# Patient Record
Sex: Female | Born: 1945 | Race: White | Hispanic: No | Marital: Married | Smoking: Never smoker
Health system: Southern US, Community
[De-identification: ages and names within clinical notes are randomized; demographics above are authoritative.]

## PROBLEM LIST (undated history)

## (undated) DIAGNOSIS — I2510 Atherosclerotic heart disease of native coronary artery without angina pectoris: Secondary | ICD-10-CM

## (undated) DIAGNOSIS — E278 Other specified disorders of adrenal gland: Secondary | ICD-10-CM

## (undated) DIAGNOSIS — I5189 Other ill-defined heart diseases: Secondary | ICD-10-CM

## (undated) DIAGNOSIS — R0600 Dyspnea, unspecified: Secondary | ICD-10-CM

## (undated) DIAGNOSIS — N183 Chronic kidney disease, stage 3 unspecified: Secondary | ICD-10-CM

## (undated) DIAGNOSIS — K589 Irritable bowel syndrome without diarrhea: Secondary | ICD-10-CM

## (undated) DIAGNOSIS — K5790 Diverticulosis of intestine, part unspecified, without perforation or abscess without bleeding: Secondary | ICD-10-CM

## (undated) DIAGNOSIS — I5030 Unspecified diastolic (congestive) heart failure: Secondary | ICD-10-CM

## (undated) DIAGNOSIS — R51 Headache: Principal | ICD-10-CM

## (undated) DIAGNOSIS — D649 Anemia, unspecified: Secondary | ICD-10-CM

## (undated) DIAGNOSIS — G2581 Restless legs syndrome: Secondary | ICD-10-CM

## (undated) DIAGNOSIS — F32A Depression, unspecified: Secondary | ICD-10-CM

## (undated) DIAGNOSIS — I447 Left bundle-branch block, unspecified: Secondary | ICD-10-CM

## (undated) DIAGNOSIS — Z8489 Family history of other specified conditions: Secondary | ICD-10-CM

## (undated) DIAGNOSIS — K219 Gastro-esophageal reflux disease without esophagitis: Secondary | ICD-10-CM

## (undated) DIAGNOSIS — M12811 Other specific arthropathies, not elsewhere classified, right shoulder: Secondary | ICD-10-CM

## (undated) DIAGNOSIS — R269 Unspecified abnormalities of gait and mobility: Secondary | ICD-10-CM

## (undated) DIAGNOSIS — R42 Dizziness and giddiness: Secondary | ICD-10-CM

## (undated) DIAGNOSIS — E119 Type 2 diabetes mellitus without complications: Secondary | ICD-10-CM

## (undated) DIAGNOSIS — E785 Hyperlipidemia, unspecified: Secondary | ICD-10-CM

## (undated) DIAGNOSIS — M1990 Unspecified osteoarthritis, unspecified site: Secondary | ICD-10-CM

## (undated) DIAGNOSIS — I471 Supraventricular tachycardia, unspecified: Secondary | ICD-10-CM

## (undated) DIAGNOSIS — F419 Anxiety disorder, unspecified: Secondary | ICD-10-CM

## (undated) DIAGNOSIS — D509 Iron deficiency anemia, unspecified: Secondary | ICD-10-CM

## (undated) DIAGNOSIS — I209 Angina pectoris, unspecified: Secondary | ICD-10-CM

## (undated) DIAGNOSIS — G4733 Obstructive sleep apnea (adult) (pediatric): Secondary | ICD-10-CM

## (undated) DIAGNOSIS — Z9884 Bariatric surgery status: Secondary | ICD-10-CM

## (undated) DIAGNOSIS — Z9989 Dependence on other enabling machines and devices: Secondary | ICD-10-CM

## (undated) DIAGNOSIS — J45909 Unspecified asthma, uncomplicated: Secondary | ICD-10-CM

## (undated) DIAGNOSIS — E1142 Type 2 diabetes mellitus with diabetic polyneuropathy: Secondary | ICD-10-CM

## (undated) DIAGNOSIS — E669 Obesity, unspecified: Secondary | ICD-10-CM

## (undated) DIAGNOSIS — I700 Atherosclerosis of aorta: Secondary | ICD-10-CM

## (undated) DIAGNOSIS — F329 Major depressive disorder, single episode, unspecified: Secondary | ICD-10-CM

## (undated) DIAGNOSIS — K222 Esophageal obstruction: Secondary | ICD-10-CM

## (undated) DIAGNOSIS — M109 Gout, unspecified: Secondary | ICD-10-CM

## (undated) DIAGNOSIS — R0789 Other chest pain: Secondary | ICD-10-CM

## (undated) DIAGNOSIS — M4802 Spinal stenosis, cervical region: Secondary | ICD-10-CM

## (undated) DIAGNOSIS — I10 Essential (primary) hypertension: Secondary | ICD-10-CM

## (undated) DIAGNOSIS — R55 Syncope and collapse: Secondary | ICD-10-CM

## (undated) HISTORY — PX: COLONOSCOPY: SHX5424

## (undated) HISTORY — DX: Esophageal obstruction: K22.2

## (undated) HISTORY — DX: Depression, unspecified: F32.A

## (undated) HISTORY — DX: Restless legs syndrome: G25.81

## (undated) HISTORY — DX: Dizziness and giddiness: R42

## (undated) HISTORY — DX: Syncope and collapse: R55

## (undated) HISTORY — DX: Left bundle-branch block, unspecified: I44.7

## (undated) HISTORY — DX: Supraventricular tachycardia, unspecified: I47.10

## (undated) HISTORY — DX: Unspecified abnormalities of gait and mobility: R26.9

## (undated) HISTORY — DX: Other specified disorders of adrenal gland: E27.8

## (undated) HISTORY — DX: Irritable bowel syndrome, unspecified: K58.9

## (undated) HISTORY — PX: ESOPHAGEAL DILATION: SHX303

## (undated) HISTORY — DX: Atherosclerotic heart disease of native coronary artery without angina pectoris: I25.10

## (undated) HISTORY — DX: Spinal stenosis, cervical region: M48.02

## (undated) HISTORY — PX: GALLBLADDER SURGERY: SHX652

## (undated) HISTORY — PX: EYE SURGERY: SHX253

## (undated) HISTORY — DX: Supraventricular tachycardia: I47.1

## (undated) HISTORY — DX: Unspecified diastolic (congestive) heart failure: I50.30

## (undated) HISTORY — PX: BACK SURGERY: SHX140

## (undated) HISTORY — DX: Obstructive sleep apnea (adult) (pediatric): G47.33

## (undated) HISTORY — DX: Anemia, unspecified: D64.9

## (undated) HISTORY — PX: APPENDECTOMY: SHX54

## (undated) HISTORY — PX: CARDIAC CATHETERIZATION: SHX172

## (undated) HISTORY — DX: Headache: R51

## (undated) HISTORY — DX: Type 2 diabetes mellitus without complications: E11.9

## (undated) HISTORY — PX: CORONARY ANGIOPLASTY: SHX604

## (undated) HISTORY — DX: Unspecified osteoarthritis, unspecified site: M19.90

## (undated) HISTORY — DX: Essential (primary) hypertension: I10

## (undated) HISTORY — PX: ESOPHAGOGASTRODUODENOSCOPY: SHX1529

## (undated) HISTORY — DX: Obesity, unspecified: E66.9

## (undated) HISTORY — DX: Gout, unspecified: M10.9

## (undated) HISTORY — PX: GASTRIC BYPASS: SHX52

## (undated) HISTORY — PX: CARDIOVASCULAR STRESS TEST: SHX262

## (undated) HISTORY — DX: Other chest pain: R07.89

## (undated) HISTORY — PX: ABDOMINAL HYSTERECTOMY: SHX81

## (undated) HISTORY — DX: Major depressive disorder, single episode, unspecified: F32.9

## (undated) HISTORY — PX: ROTATOR CUFF REPAIR: SHX139

## (undated) HISTORY — DX: Type 2 diabetes mellitus with diabetic polyneuropathy: E11.42

---

## 1972-07-16 HISTORY — PX: TOTAL ABDOMINAL HYSTERECTOMY W/ BILATERAL SALPINGOOPHORECTOMY: SHX83

## 1974-07-16 HISTORY — PX: GALLBLADDER SURGERY: SHX652

## 1974-07-16 HISTORY — PX: CHOLECYSTECTOMY: SHX55

## 1986-04-15 HISTORY — PX: LEFT HEART CATH AND CORONARY ANGIOGRAPHY: CATH118249

## 1992-07-16 DIAGNOSIS — M4802 Spinal stenosis, cervical region: Secondary | ICD-10-CM

## 1992-07-16 HISTORY — DX: Spinal stenosis, cervical region: M48.02

## 1993-07-16 HISTORY — PX: SPINE SURGERY: SHX786

## 2003-08-26 ENCOUNTER — Other Ambulatory Visit: Payer: Self-pay

## 2004-03-12 ENCOUNTER — Other Ambulatory Visit: Payer: Self-pay

## 2004-05-03 ENCOUNTER — Ambulatory Visit: Payer: Self-pay | Admitting: Unknown Physician Specialty

## 2004-05-26 ENCOUNTER — Ambulatory Visit: Payer: Self-pay | Admitting: Unknown Physician Specialty

## 2004-06-28 ENCOUNTER — Encounter: Payer: Self-pay | Admitting: Family Medicine

## 2004-07-16 ENCOUNTER — Encounter: Payer: Self-pay | Admitting: Family Medicine

## 2004-08-16 ENCOUNTER — Encounter: Payer: Self-pay | Admitting: Family Medicine

## 2004-09-13 ENCOUNTER — Emergency Department: Payer: Self-pay | Admitting: Emergency Medicine

## 2005-01-10 ENCOUNTER — Ambulatory Visit: Payer: Self-pay | Admitting: Unknown Physician Specialty

## 2005-03-16 ENCOUNTER — Ambulatory Visit: Payer: Self-pay | Admitting: Podiatry

## 2005-03-21 ENCOUNTER — Ambulatory Visit: Payer: Self-pay | Admitting: Unknown Physician Specialty

## 2005-05-31 ENCOUNTER — Emergency Department: Payer: Self-pay | Admitting: Emergency Medicine

## 2005-07-16 HISTORY — PX: TOTAL KNEE ARTHROPLASTY: SHX125

## 2005-07-16 HISTORY — PX: JOINT REPLACEMENT: SHX530

## 2005-09-04 ENCOUNTER — Other Ambulatory Visit: Payer: Self-pay

## 2005-09-04 ENCOUNTER — Emergency Department: Payer: Self-pay | Admitting: General Practice

## 2005-10-02 ENCOUNTER — Inpatient Hospital Stay: Payer: Self-pay | Admitting: Unknown Physician Specialty

## 2005-10-05 ENCOUNTER — Ambulatory Visit: Payer: Self-pay | Admitting: Physical Medicine & Rehabilitation

## 2005-10-05 ENCOUNTER — Inpatient Hospital Stay (HOSPITAL_COMMUNITY)
Admission: RE | Admit: 2005-10-05 | Discharge: 2005-10-13 | Payer: Self-pay | Admitting: Physical Medicine & Rehabilitation

## 2006-03-02 ENCOUNTER — Emergency Department: Payer: Self-pay | Admitting: General Practice

## 2006-07-10 ENCOUNTER — Observation Stay: Payer: Self-pay | Admitting: Gastroenterology

## 2006-12-13 ENCOUNTER — Other Ambulatory Visit: Payer: Self-pay

## 2006-12-13 ENCOUNTER — Emergency Department: Payer: Self-pay | Admitting: Emergency Medicine

## 2006-12-20 ENCOUNTER — Encounter: Admission: RE | Admit: 2006-12-20 | Discharge: 2006-12-20 | Payer: Self-pay | Admitting: Neurology

## 2006-12-24 ENCOUNTER — Ambulatory Visit: Payer: Self-pay | Admitting: Internal Medicine

## 2007-09-02 ENCOUNTER — Ambulatory Visit: Payer: Self-pay | Admitting: Gastroenterology

## 2007-10-07 ENCOUNTER — Ambulatory Visit: Payer: Self-pay | Admitting: Gastroenterology

## 2007-12-15 ENCOUNTER — Ambulatory Visit: Payer: Self-pay | Admitting: Oncology

## 2007-12-18 ENCOUNTER — Ambulatory Visit: Payer: Self-pay | Admitting: Oncology

## 2007-12-31 ENCOUNTER — Other Ambulatory Visit: Payer: Self-pay

## 2007-12-31 ENCOUNTER — Inpatient Hospital Stay: Payer: Self-pay | Admitting: Internal Medicine

## 2008-01-14 ENCOUNTER — Ambulatory Visit: Payer: Self-pay | Admitting: Oncology

## 2008-02-06 ENCOUNTER — Ambulatory Visit: Payer: Self-pay | Admitting: Urology

## 2008-02-14 ENCOUNTER — Ambulatory Visit: Payer: Self-pay | Admitting: Oncology

## 2008-03-02 LAB — HM COLONOSCOPY: HM Colonoscopy: NORMAL

## 2008-03-16 ENCOUNTER — Ambulatory Visit: Payer: Self-pay | Admitting: Oncology

## 2008-04-15 ENCOUNTER — Ambulatory Visit: Payer: Self-pay | Admitting: Oncology

## 2008-04-19 ENCOUNTER — Ambulatory Visit: Payer: Self-pay | Admitting: Oncology

## 2008-05-07 ENCOUNTER — Ambulatory Visit: Payer: Self-pay | Admitting: Specialist

## 2008-05-16 ENCOUNTER — Ambulatory Visit: Payer: Self-pay | Admitting: Oncology

## 2008-07-16 ENCOUNTER — Ambulatory Visit: Payer: Self-pay | Admitting: Oncology

## 2008-07-19 ENCOUNTER — Ambulatory Visit: Payer: Self-pay | Admitting: Internal Medicine

## 2008-07-27 ENCOUNTER — Ambulatory Visit: Payer: Self-pay | Admitting: Oncology

## 2008-08-13 ENCOUNTER — Ambulatory Visit: Payer: Self-pay | Admitting: Specialist

## 2008-08-16 ENCOUNTER — Ambulatory Visit: Payer: Self-pay | Admitting: Oncology

## 2008-08-31 ENCOUNTER — Ambulatory Visit: Payer: Self-pay | Admitting: Internal Medicine

## 2008-10-25 ENCOUNTER — Ambulatory Visit: Payer: Self-pay | Admitting: Urology

## 2008-11-01 ENCOUNTER — Encounter: Admission: RE | Admit: 2008-11-01 | Discharge: 2009-01-30 | Payer: Self-pay | Admitting: Neurology

## 2009-01-13 ENCOUNTER — Ambulatory Visit: Payer: Self-pay | Admitting: Oncology

## 2009-01-26 ENCOUNTER — Ambulatory Visit: Payer: Self-pay | Admitting: Oncology

## 2009-02-13 ENCOUNTER — Ambulatory Visit: Payer: Self-pay | Admitting: Oncology

## 2009-03-18 ENCOUNTER — Emergency Department: Payer: Self-pay | Admitting: Emergency Medicine

## 2009-07-05 ENCOUNTER — Ambulatory Visit: Payer: Self-pay | Admitting: Internal Medicine

## 2009-08-12 ENCOUNTER — Emergency Department: Payer: Self-pay | Admitting: Emergency Medicine

## 2009-08-22 ENCOUNTER — Ambulatory Visit: Payer: Self-pay | Admitting: Gastroenterology

## 2009-09-13 DIAGNOSIS — K222 Esophageal obstruction: Secondary | ICD-10-CM

## 2009-09-13 HISTORY — DX: Esophageal obstruction: K22.2

## 2009-09-18 ENCOUNTER — Ambulatory Visit: Payer: Self-pay | Admitting: Gastroenterology

## 2009-11-22 ENCOUNTER — Ambulatory Visit: Payer: Self-pay | Admitting: Internal Medicine

## 2010-02-23 ENCOUNTER — Ambulatory Visit: Payer: Self-pay | Admitting: Internal Medicine

## 2010-04-20 ENCOUNTER — Encounter: Payer: Self-pay | Admitting: Neurology

## 2010-05-08 ENCOUNTER — Ambulatory Visit: Payer: Self-pay | Admitting: Internal Medicine

## 2010-05-16 ENCOUNTER — Encounter: Payer: Self-pay | Admitting: Neurology

## 2010-06-15 ENCOUNTER — Encounter: Payer: Self-pay | Admitting: Neurology

## 2010-09-22 ENCOUNTER — Emergency Department: Payer: Self-pay | Admitting: Emergency Medicine

## 2010-10-31 ENCOUNTER — Ambulatory Visit: Payer: Self-pay | Admitting: Internal Medicine

## 2010-12-01 NOTE — H&P (Signed)
Denise Sharp, Denise Sharp             ACCOUNT NO.:  [id number]   MEDICAL RECORD NO.:  [id number]          PATIENT TYPE:  IPS   LOCATION:  4025                         FACILITY:  [facility]   PHYSICIAN:  Charlett Blake, M.D.DATE OF BIRTH:  24-Feb-1946   DATE OF ADMISSION:  10/05/2005  DATE OF DISCHARGE:                                HISTORY & PHYSICAL   HISTORY:  A 65 year old female, with a history of diabetes and neuropathy as  well as bilateral knee pain with tricompartmental osteoarthritis  unresponsive to conservative care, elected to undergo bilateral total knee  replacements on October 02, 2005, per Dr. Mauri Pole.  Postoperatively, she was  made weightbearing as tolerated and started physical therapy.  She was  started on subcu Lovenox for DVT prophylaxis.  She had postoperative anemia.   REVIEW OF SYSTEMS:  Positive for urinary incontinence, urge incontinence.  She has bowel incontinence associated with her irritable bowel syndrome and  chronic low back pain as well as right lower extremity radicular symptoms.   PAST MEDICAL HISTORY:  1.  Pernicious anemia.  2.  Asthma.  3.  Cervical stenosis.  4.  Depression.  5.  Gout.  6.  Irritable bowel syndrome.  7.  Osteoporosis.  8.  Renal calculi.  9.  Reactive airway disease.  10. Sleep apnea requiring CPAP.   PAST SURGICAL HISTORY:  1.  Cholecystectomy.  2.  Appendectomy.  3.  Bilateral oophorectomy.  4.  Gastric bypass in 2001.   FAMILY HISTORY:  Positive for CAD, positive for RA, positive for  hypertension.   SOCIAL HISTORY:  Disabled, retired [employer] scientist, married, lives in a one level home  with ramp entry.   FUNCTIONAL HISTORY:  Used a scooter as well as a walker prior to admission.   FUNCTIONAL STATUS:  Requires physical assistance for ADLs and mobility.   HOME MEDICATIONS:  Climara, Flexeril, Vicodin, Imodium, and Zyrtec as well  as Lunesta.   ALLERGIES:  1.  LASIX.  2.  SULFA.  3.  TETRACYCLINE.   ADMISSION  MEDICATIONS:  1.  Lovenox 30 subcu q.12h.  2.  Trinsicon one p.o. b.i.d.  3.  Zyrtec 10 mg p.o. every day.  4.  Lunesta 1 mg p.o. q.h.s.  5.  Flexeril 5 to 10 p.o. t.i.d.  6.  Imodium 2 mg p.o. p.r.n., max 8 mg per day.  7.  Climara patch 0.1 mg change every week.   Last chest x-ray, done October 02, 2005, showed well expanded lung fields  clear.   PHYSICAL EXAMINATION:  GENERAL:  Obese female in no acute distress.  EYES:  Anicteric.  No injected.  ENT:  External ENT normal.  NECK:  Supple without adenopathy.  RESPIRATORY:  Effort is good.  LUNGS:  Clear.  HEART:  Regular rate and rhythm.  EXTREMITIES:  No clubbing, cyanosis, edema.  Extremities show bilateral knee  incisions clean and dry.  Staples intact.  No drainage.  ABDOMEN:  Positive bowel sounds.  Soft, nontender to palpation.  NEUROLOGIC:  Mood, memory, and affect are all normal.  Sensation is reduced  in the feet compared to the knees.  Motor strength is 5/5 bilateral upper  extremities and lower extremities.  In the lower extremities, she is 3- in  the hip flexors, knee extensors, and 4- ankle dorsiflexors.   IMPRESSION:  1.  Decline in self care mobility skills, status post bilateral total knee      replacements for osteoarthritis.  2.  History of diabetic neuropathy with lower extremity sensory loss.  3.  Irritable bowel syndrome necessitating frequent trips to the bathroom as      well as adult diaper usage.  4.  Chronic insomnia.  5.  Deep vein thrombosis prophylaxis with subcutaneous Lovenox.  6.  Postoperative knee pain as needed Vicodin and Flexeril.    Estimated length of stay is 10 to 14 days.  The patient is a good rehab  candidate to achieve goals of supervision level, ADLs, and mobility.      Charlett Blake, M.D.  Electronically Signed     AEK/MEDQ  D:  10/05/2005  T:  10/06/2005  Job:  [id number]   cc:   A. Sabra Heck, Dr.   Mauri Pole, Dr.   Tiffany Kocher, Dr.

## 2010-12-01 NOTE — Discharge Summary (Signed)
NAMEJENELLY, Denise Sharp             ACCOUNT NO.:  [id number]   MEDICAL RECORD NO.:  [id number]          PATIENT TYPE:  IPS   LOCATION:  [id number]                         FACILITY:  [facility]   PHYSICIAN:  Charlett Blake, M.D.DATE OF BIRTH:  [birth year]   DATE OF ADMISSION:  10/05/2005  DATE OF DISCHARGE:  10/13/2005                                 DISCHARGE SUMMARY   DISCHARGE DIAGNOSES:  1.  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  2.  Irritable bowel syndrome.  3.  Morbid obesity.  4.  Diabetes mellitus type 2.  5.  Diabetic neuropathy.  6.  Pernicious anemia.   HISTORY OF PRESENT ILLNESS:  Denise Sharp is a 65 year old female with  history of diabetes, diet controlled; bilateral knee pain secondary to  tricompartmental OA.  She elected to undergo bilateral total knee  replacement October 02, 2005, by Dr. Mauri Pole.  Postoperative is weightbearing  as tolerated and on subcu Lovenox for DVT prophylaxis.  Also noted to have  acute blood loss anemia.  Therapies initiated and the patient noted to have  impairments in mobility and self-care.  Rehab consulted for further  therapies.   PAST MEDICAL HISTORY:  Significant for asthma, cervical stenosis,  depression, gout, IBS, cholecystectomy, appendectomy, bilateral  oophorectomies, osteopenia, gastric bypass x2, renal calculi, reactive  airway disease, sleep apnea, DJD in 2004, bilateral knee surgeries and right  shoulder DJD, low-back pain with radiculopathy right lower extremity.   ALLERGIES:  LASIX, SULFA.   [facility] is positive for coronary artery disease, RV, and hypertension.   SOCIAL HISTORY:  The patient is married, retired Marine scientist, disabled.  Lives in  one-level home with ramp at entry.  Used a scooter; otherwise, ambulated  short distances and was using a walker for support.  Does not use any  tobacco or alcohol.   HOSPITAL COURSE:  Denise Sharp was admitted to rehab on October 05, 2005,  for inpatient therapies to consist of PT/OT daily.  Past admission she  was maintained on subcu Lovenox for DVT prophylaxis.  Pain control was  reasonable with the use of p.r.n. oxycodone.  Labs done past admission  showed the patient to continue with acute blood loss anemia with H&H at 8.9  and 27.3.  White count was noted to be at 11.4.  The patient was noted to  have some low-grade temperature elevation past admission.  Incentive  spirometry was recommended.  A UA UCS was checked and showed 10,000 colonies  of multiple species.  The patient's fevers resolved as mobility improved.  Check of electrolytes revealed sodium 138, potassium 3.5, chloride 103, CO2  30, BUN 9, creatinine 0.7, glucose 172.  Secondary to impaired fasting blood  sugars, CBGs were checked on a.c./h.s. basis initially.  Hemoglobin A1c was  also checked and was noted to be high at 6.7.  The patient's blood sugars  did rise up to 200s.  Therefore, Glucophage 500 mg was initiated and  increased to 500 mg t.i.d.  Blood sugars at time of discharge were somewhat  improved, ranging to 110s to highs  in [place].  The patient has been instructed  on carbohydrate-modified diet and is to continue with Glucophage for now.  As mobility improves, watch for blood sugars to trend downwards.  The  patient to follow up with Dr. Kem Kays for recheck hemoglobin A1c in  the next 4 weeks and further adjustment of her hyperglycemic as needed.   The patient's respiratory status has been stable.  She has been using CPAP  on p.r.n. basis during her stay.  Does report improvement in sleep hygiene  with use of CPAP and was encouraged to continue using this daily.  The  patient's knee incisions were noted to be healing well without any signs or  symptoms of infection; no drainage, no erythema noted.  Staples discontinued  and areas with Steri-Strips on postoperative day #11 without difficulty.  During her stay in rehab, Denise Sharp progressed  along well.  At time of  discharge the patient was at supervision to min assist for car transfers,  supervision for ambulating 100 feet, supervision for navigating ramp.  She  was able to ambulate greater than 200 feet x4 with a rolling walker with  supervision.  The patient was at set up assist for ADLs.  Knee flexion at  time of discharge on right was at -4 to 84 degrees, left 5 to 76 degrees.  CPM was used for range of motion throughout the stay.  The patient will  continue with further followup.  Home health PT/OT by [place].  Home health RN is arranged for education and for Lovenox draws.  The patient  to continue on Lovenox for three additional days to complete her DVT  prophylaxis course.   DISCHARGE MEDICATIONS:  1.  Climara patch changed weekly.  2.  Zyrtec 10 mg a day.  3.  Trinsicon one p.o. b.i.d.  4.  Glucophage 500 mg t.i.d. a.c.  5.  Vicodin one to two q.4-6h. p.r.n. pain.  6.  Flexeril 10 mg one-half to one q.8h. p.r.n. spasms.  7.  Lunesta half pill q.h.s.  8.  Lovenox 40 mg subcu once daily for three more days.   ACTIVITY:  As tolerated, use a walker and supervision.  No driving for 3-4  weeks.  No strenuous activity 3-4 weeks.   DIET:  Diabetic diet.   WOUND CARE:  Wash with antibacterial soap and water.  Keep clean and dry.   SPECIAL INSTRUCTIONS:  Home health PT/OT/RN by [facility].   FOLLOWUP:  The patient to follow up with Dr. Mauri Pole for postoperative check.  Follow up with Dr. Kem Kays in 2-4 weeks for diabetic monitoring.  Follow up with Dr. Letta Pate as needed.      Thornton Dales, P.A.      Charlett Blake, M.D.  Electronically Signed    PP/MEDQ  D:  10/16/2005  T:  10/17/2005  Job:  [id number]   cc:   Kem Kays, M.D.   Dr. Mauri Pole

## 2011-03-12 ENCOUNTER — Encounter: Payer: Self-pay | Admitting: Internal Medicine

## 2011-03-12 ENCOUNTER — Ambulatory Visit (INDEPENDENT_AMBULATORY_CARE_PROVIDER_SITE_OTHER): Payer: Medicare Other | Admitting: Internal Medicine

## 2011-03-12 VITALS — BP 122/86 | HR 74 | Temp 98.7°F | Resp 14 | Ht 64.5 in | Wt 230.8 lb

## 2011-03-12 DIAGNOSIS — E1165 Type 2 diabetes mellitus with hyperglycemia: Secondary | ICD-10-CM | POA: Insufficient documentation

## 2011-03-12 DIAGNOSIS — E279 Disorder of adrenal gland, unspecified: Secondary | ICD-10-CM

## 2011-03-12 DIAGNOSIS — IMO0002 Reserved for concepts with insufficient information to code with codable children: Secondary | ICD-10-CM

## 2011-03-12 DIAGNOSIS — E119 Type 2 diabetes mellitus without complications: Secondary | ICD-10-CM | POA: Insufficient documentation

## 2011-03-12 DIAGNOSIS — Z1239 Encounter for other screening for malignant neoplasm of breast: Secondary | ICD-10-CM

## 2011-03-12 DIAGNOSIS — E785 Hyperlipidemia, unspecified: Secondary | ICD-10-CM

## 2011-03-12 DIAGNOSIS — E1140 Type 2 diabetes mellitus with diabetic neuropathy, unspecified: Secondary | ICD-10-CM | POA: Insufficient documentation

## 2011-03-12 DIAGNOSIS — K589 Irritable bowel syndrome without diarrhea: Secondary | ICD-10-CM

## 2011-03-12 DIAGNOSIS — H9191 Unspecified hearing loss, right ear: Secondary | ICD-10-CM | POA: Insufficient documentation

## 2011-03-12 DIAGNOSIS — I10 Essential (primary) hypertension: Secondary | ICD-10-CM | POA: Insufficient documentation

## 2011-03-12 DIAGNOSIS — R5381 Other malaise: Secondary | ICD-10-CM

## 2011-03-12 DIAGNOSIS — G4710 Hypersomnia, unspecified: Secondary | ICD-10-CM

## 2011-03-12 DIAGNOSIS — E278 Other specified disorders of adrenal gland: Secondary | ICD-10-CM

## 2011-03-12 DIAGNOSIS — H9190 Unspecified hearing loss, unspecified ear: Secondary | ICD-10-CM

## 2011-03-12 DIAGNOSIS — G2581 Restless legs syndrome: Secondary | ICD-10-CM

## 2011-03-12 DIAGNOSIS — E1142 Type 2 diabetes mellitus with diabetic polyneuropathy: Secondary | ICD-10-CM | POA: Insufficient documentation

## 2011-03-12 DIAGNOSIS — E1149 Type 2 diabetes mellitus with other diabetic neurological complication: Secondary | ICD-10-CM | POA: Insufficient documentation

## 2011-03-12 DIAGNOSIS — E1349 Other specified diabetes mellitus with other diabetic neurological complication: Secondary | ICD-10-CM

## 2011-03-12 DIAGNOSIS — E08610 Diabetes mellitus due to underlying condition with diabetic neuropathic arthropathy: Secondary | ICD-10-CM

## 2011-03-12 DIAGNOSIS — R5383 Other fatigue: Secondary | ICD-10-CM

## 2011-03-12 MED ORDER — DIAZEPAM 2 MG PO TABS: ORAL_TABLET | ORAL | Status: DC

## 2011-03-12 NOTE — Progress Notes (Signed)
  Subjective:    Patient ID: Denise Sharp, female    DOB: 02-04-1946, 65 y.o.   MRN: [id number]  HPI  Presents with hypersomnolence, occurring for the past 3 months,  She has been falling asleep while crochetin , reading  menus at a restaurant, etc.  She has OSA but has been wearing her CPAP.  Last CPAP study was over one year ago and per patient there were no changes to settings.   And sleeping better at night due to management of bladder incontinence by Dr. Jacqlyn Larsen.  She has been taking 5 mg valium daily by podiatry for nocturnal leg cramps, which is helping, but feels groggy in the morning. Also taking ropinrole and imipramine nightly, and prn oxycodone for leg pain    Review of Systems  Constitutional: Positive for fatigue. Negative for fever, chills, appetite change and unexpected weight change.  HENT: Positive for ear pain. Negative for hearing loss, nosebleeds, congestion, sore throat, facial swelling, rhinorrhea, sneezing, mouth sores, trouble swallowing, neck pain, neck stiffness, voice change, postnasal drip, sinus pressure, tinnitus and ear discharge.   Eyes: Negative for pain, discharge, redness and visual disturbance.  Respiratory: Positive for apnea. Negative for cough, chest tightness, shortness of breath, wheezing and stridor.   Cardiovascular: Negative for chest pain, palpitations and leg swelling.  Gastrointestinal: Negative for nausea, vomiting, abdominal pain, diarrhea, constipation, blood in stool, abdominal distention and anal bleeding.  Genitourinary: Negative for dysuria and flank pain.  Musculoskeletal: Negative for myalgias, arthralgias and gait problem.  Skin: Negative for color change and rash.  Neurological: Positive for weakness and light-headedness. Negative for dizziness and headaches.  Hematological: Negative for adenopathy. Does not bruise/bleed easily.  Psychiatric/Behavioral: Positive for dysphoric mood. Negative for suicidal ideas and sleep disturbance. The  patient is not nervous/anxious.        Objective:   Physical Exam  Constitutional: Vital signs are normal. She appears well-developed. She is active.  HENT:  Head: Normocephalic.  Eyes: EOM are normal. Pupils are equal, round, and reactive to light.  Neck: Normal range of motion. No thyromegaly present.  Abdominal: Soft. Bowel sounds are normal.  Neurological: She is alert.  Skin: Skin is warm and dry.  Psychiatric: She has a normal mood and affect.          Assessment & Plan:  Hypersomnolence: most obvious cause is pharmacotherapy.  Will reduce her valium by 1 mg weekly .  If no improvement with reduction in valiu, weill repeat sleep study.  Fatigue:  Given history of anemia, will check cbc and tsh.  Exercise recommended.  Diabetes Melllitus:  Recently she had a loss of control due to steroids given by ENT for complicated otitis.  Sugars are now under control .  hga1c due at next visit.

## 2011-03-12 NOTE — Patient Instructions (Addendum)
Stop the imipramine at bedtime.  Reduce the valium to 4 mg at bedtime.  If this change does not improve your grogginess, reduce the valium to 3 mg at bedtime.   We will repeat cholesterol and HgbA1c at your next visit.

## 2011-03-13 ENCOUNTER — Encounter: Payer: Self-pay | Admitting: Internal Medicine

## 2011-03-13 DIAGNOSIS — K589 Irritable bowel syndrome without diarrhea: Secondary | ICD-10-CM | POA: Insufficient documentation

## 2011-03-13 DIAGNOSIS — IMO0002 Reserved for concepts with insufficient information to code with codable children: Secondary | ICD-10-CM | POA: Insufficient documentation

## 2011-03-13 DIAGNOSIS — G2581 Restless legs syndrome: Secondary | ICD-10-CM | POA: Insufficient documentation

## 2011-03-15 ENCOUNTER — Telehealth: Payer: Self-pay | Admitting: Internal Medicine

## 2011-03-15 NOTE — Telephone Encounter (Signed)
Patient called and stated she has a bad cough and a sore throat and she forgot to tell you at her office visit this week.  She stated she was getting a treatment today in another doctors office and that doctor would not write her an Rx for her cough.  She wanted to know if you would give her an Rx for cough syrup.  Please advise.

## 2011-03-15 NOTE — Telephone Encounter (Signed)
She did not cough during the office visit one time.  Rx tessalon 200 mg one capsule every 8 hours prn cough  #60,  Gargle with salt water for sore throat

## 2011-03-29 MED ORDER — BENZONATATE 200 MG PO CAPS: 200.0000 mg | ORAL_CAPSULE | Freq: Three times a day (TID) | ORAL | Status: AC | PRN

## 2011-04-23 ENCOUNTER — Encounter: Payer: Self-pay | Admitting: Internal Medicine

## 2011-05-24 ENCOUNTER — Other Ambulatory Visit: Payer: Self-pay | Admitting: *Deleted

## 2011-05-25 MED ORDER — ROPINIROLE HCL 1 MG PO TABS: 1.0000 mg | ORAL_TABLET | Freq: Every day | ORAL | Status: DC

## 2011-06-13 ENCOUNTER — Encounter: Payer: Self-pay | Admitting: Internal Medicine

## 2011-06-13 ENCOUNTER — Ambulatory Visit (INDEPENDENT_AMBULATORY_CARE_PROVIDER_SITE_OTHER): Payer: Medicare Other | Admitting: Internal Medicine

## 2011-06-13 DIAGNOSIS — Z23 Encounter for immunization: Secondary | ICD-10-CM

## 2011-06-13 DIAGNOSIS — G2581 Restless legs syndrome: Secondary | ICD-10-CM

## 2011-06-13 DIAGNOSIS — I10 Essential (primary) hypertension: Secondary | ICD-10-CM

## 2011-06-13 DIAGNOSIS — D510 Vitamin B12 deficiency anemia due to intrinsic factor deficiency: Secondary | ICD-10-CM

## 2011-06-13 DIAGNOSIS — K589 Irritable bowel syndrome without diarrhea: Secondary | ICD-10-CM

## 2011-06-13 DIAGNOSIS — E119 Type 2 diabetes mellitus without complications: Secondary | ICD-10-CM

## 2011-06-13 DIAGNOSIS — G4730 Sleep apnea, unspecified: Secondary | ICD-10-CM

## 2011-06-13 DIAGNOSIS — M4802 Spinal stenosis, cervical region: Secondary | ICD-10-CM

## 2011-06-13 DIAGNOSIS — E279 Disorder of adrenal gland, unspecified: Secondary | ICD-10-CM

## 2011-06-13 DIAGNOSIS — E785 Hyperlipidemia, unspecified: Secondary | ICD-10-CM

## 2011-06-13 DIAGNOSIS — E278 Other specified disorders of adrenal gland: Secondary | ICD-10-CM

## 2011-06-13 LAB — CBC WITH DIFFERENTIAL/PLATELET
Basophils Absolute: 0.1 10*3/uL (ref 0.0–0.1)
Basophils Relative: 0.5 % (ref 0.0–3.0)
Eosinophils Absolute: 0.1 10*3/uL (ref 0.0–0.7)
Eosinophils Relative: 1 % (ref 0.0–5.0)
HCT: 30 % — ABNORMAL LOW (ref 36.0–46.0)
Hemoglobin: 9.8 g/dL — ABNORMAL LOW (ref 12.0–15.0)
Lymphocytes Relative: 20.6 % (ref 12.0–46.0)
Lymphs Abs: 2.2 10*3/uL (ref 0.7–4.0)
MCHC: 32.6 g/dL (ref 30.0–36.0)
MCV: 77.8 fl — ABNORMAL LOW (ref 78.0–100.0)
Monocytes Absolute: 0.5 10*3/uL (ref 0.1–1.0)
Monocytes Relative: 5 % (ref 3.0–12.0)
Neutro Abs: 7.7 10*3/uL (ref 1.4–7.7)
Neutrophils Relative %: 72.9 % (ref 43.0–77.0)
Platelets: 320 10*3/uL (ref 150.0–400.0)
RBC: 3.85 Mil/uL — ABNORMAL LOW (ref 3.87–5.11)
RDW: 18.7 % — ABNORMAL HIGH (ref 11.5–14.6)
WBC: 10.6 10*3/uL — ABNORMAL HIGH (ref 4.5–10.5)

## 2011-06-13 LAB — COMPREHENSIVE METABOLIC PANEL
ALT: 13 U/L (ref 0–35)
AST: 20 U/L (ref 0–37)
Albumin: 3.2 g/dL — ABNORMAL LOW (ref 3.5–5.2)
Alkaline Phosphatase: 104 U/L (ref 39–117)
BUN: 15 mg/dL (ref 6–23)
CO2: 23 mEq/L (ref 19–32)
Calcium: 8.6 mg/dL (ref 8.4–10.5)
Chloride: 107 mEq/L (ref 96–112)
Creatinine, Ser: 0.9 mg/dL (ref 0.4–1.2)
GFR: 69.3 mL/min (ref >60.00)
Glucose, Bld: 132 mg/dL — ABNORMAL HIGH (ref 70–99)
Potassium: 4.2 mEq/L (ref 3.5–5.1)
Sodium: 141 mEq/L (ref 135–145)
Total Bilirubin: 0.5 mg/dL (ref 0.3–1.2)
Total Protein: 7.1 g/dL (ref 6.0–8.3)

## 2011-06-13 LAB — LIPID PANEL
Cholesterol: 189 mg/dL (ref 0–200)
HDL: 52.2 mg/dL (ref >39.00)
LDL Cholesterol: 105 mg/dL — ABNORMAL HIGH (ref 0–99)
Total CHOL/HDL Ratio: 4
Triglycerides: 157 mg/dL — ABNORMAL HIGH (ref 0.0–149.0)
VLDL: 31.4 mg/dL (ref 0.0–40.0)

## 2011-06-13 LAB — VITAMIN B12: Vitamin B-12: 437 pg/mL (ref 211–911)

## 2011-06-13 LAB — HEMOGLOBIN A1C: Hgb A1c MFr Bld: 7 % — ABNORMAL HIGH (ref 4.6–6.5)

## 2011-06-13 MED ORDER — ESTRADIOL 0.5 MG PO TABS: 0.5000 mg | ORAL_TABLET | Freq: Every day | ORAL | Status: DC

## 2011-06-13 MED ORDER — ROPINIROLE HCL 1 MG PO TABS: 1.0000 mg | ORAL_TABLET | Freq: Every day | ORAL | Status: DC

## 2011-06-13 MED ORDER — OMEPRAZOLE 40 MG PO CPDR: 40.0000 mg | DELAYED_RELEASE_CAPSULE | Freq: Two times a day (BID) | ORAL | Status: DC

## 2011-06-13 MED ORDER — CYANOCOBALAMIN 1000 MCG/ML IJ SOLN: 1000.0000 ug | INTRAMUSCULAR | Status: DC

## 2011-06-13 NOTE — Progress Notes (Signed)
Subjective:    Patient ID: Denise Sharp, female    DOB: 1945-10-14, 65 y.o.   MRN: [id number]  HPI  65 yo white female with history of recurrent obesity despite  history of gastric bypass,  Diabetes mellitus, obstructive sleep apnea and chronic back pain,  Presents for 3 month followup on chronic medical issues,.  Her back pain has been agravated by home decorating for the holidays, and she is frustrated  at the lack of help her husband and family members who live with her provide her in the day to day activities of home maintenance.   "wish I could have a nervous breakdown so I could get some rest."  Her blood sugars have been generally well controlled on current regimen with no hypoglycemic episodes and rare elevations over 180. Past Medical History  Diagnosis Date  . Diabetes mellitus   . Depression   . Hypertension   . Cervical spinal stenosis 1994    due to trauma to back ([employer] accident), has intermittent paralysis and parasthesias  . Adrenal mass   . IBS (irritable bowel syndrome)   . Obstructive sleep apnea   . Anemia     iron deficient post  2 unit txfsn 2009, normal endo/colonoscopy by [facility]   Current Outpatient Prescriptions on File Prior to Visit  Medication Sig Dispense Refill  . hydrocodone-acetaminophen (LORCET-HD) 5-500 MG per capsule Take 1 capsule by mouth every 6 (six) hours as needed.        . Insulin Aspart Prot & Aspart (NOVOLOG MIX 70/30 FLEXPEN [place]) Inject into the skin as needed. Sliding scale      . Loperamide HCl (IMODIUM PO) Take by mouth.        . nebivolol (BYSTOLIC) 10 MG tablet Take 10 mg by mouth daily.        [name] oxyCODONE-acetaminophen (PERCOCET) 5-325 MG per tablet Take 1 tablet by mouth every 6 (six) hours as needed.        . sitaGLIPtin (JANUVIA) 100 MG tablet Take 100 mg by mouth daily.        [name] telmisartan-hydrochlorothiazide (MICARDIS HCT) 80-12.5 MG per tablet Take 1 tablet by mouth daily.        . traMADol (ULTRAM) 50 MG tablet Take 50 mg by  mouth 4 (four) times daily.          Review of Systems  Constitutional: Positive for fatigue. Negative for fever, chills, appetite change and unexpected weight change.  HENT: Negative for hearing loss, ear pain, nosebleeds, congestion, sore throat, facial swelling, rhinorrhea, sneezing, mouth sores, trouble swallowing, neck pain, neck stiffness, voice change, postnasal drip, sinus pressure, tinnitus and ear discharge.   Eyes: Negative for pain, discharge, redness and visual disturbance.  Respiratory: Positive for apnea. Negative for cough, chest tightness, shortness of breath, wheezing and stridor.   Cardiovascular: Negative for chest pain, palpitations and leg swelling.  Gastrointestinal: Negative for nausea, vomiting, abdominal pain, diarrhea, constipation, blood in stool, abdominal distention and anal bleeding.  Genitourinary: Negative for dysuria and flank pain.  Musculoskeletal: Negative for myalgias, arthralgias and gait problem.  Skin: Negative for color change and rash.  Neurological: Positive for weakness and light-headedness. Negative for dizziness and headaches.  Hematological: Negative for adenopathy. Does not bruise/bleed easily.  Psychiatric/Behavioral: Positive for dysphoric mood. Negative for suicidal ideas and sleep disturbance. The patient is not nervous/anxious.        Objective:   Physical Exam  Constitutional: Vital signs are normal. She appears well-developed. She is active.  Morbidly obese  HENT:  Head: Normocephalic.  Eyes: EOM are normal. Pupils are equal, round, and reactive to light.  Neck: Normal range of motion. No thyromegaly present.  Abdominal: Soft. Bowel sounds are normal.  Neurological: She is alert.  Skin: Skin is warm and dry.  Psychiatric: She has a normal mood and affect.       Assessment & Plan:  DM:  She is due for repeat A1c, which was drawn today. Fasting lipids and CMEt also due.  Continue current regimen.. Is up to date on eye exams  and foot exams.  Fatigue:  lkely multifactorial, from deconditioning, chronic back pain, and OSA.  Autotitration study ordered for home CPAP evaluation

## 2011-06-13 NOTE — Patient Instructions (Signed)
We will get you scheduled to have an auto CPAP study to see if your CPAP is appropriately set.    We will call you with your lab results

## 2011-06-14 ENCOUNTER — Encounter: Payer: Self-pay | Admitting: Internal Medicine

## 2011-06-14 DIAGNOSIS — M4802 Spinal stenosis, cervical region: Secondary | ICD-10-CM | POA: Insufficient documentation

## 2011-06-14 NOTE — Assessment & Plan Note (Signed)
Well controlled currently.  No changes today 

## 2011-06-14 NOTE — Assessment & Plan Note (Signed)
Prior pheo workup by Urology, stable appearance by serial CTS/

## 2011-06-14 NOTE — Assessment & Plan Note (Signed)
Aggravated by gastric bypass status and anxiety.  Continue immodium. followup up with [facility] as needed.

## 2011-06-14 NOTE — Assessment & Plan Note (Signed)
Managed with ropinrole and valium for recurrent muscle cramps.

## 2011-06-14 NOTE — Assessment & Plan Note (Signed)
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 Was followed previosuly by [facility] and has deferred surgery.  Continue prn oxycodone for pain control

## 2011-06-15 ENCOUNTER — Other Ambulatory Visit: Payer: Self-pay | Admitting: Internal Medicine

## 2011-06-15 MED ORDER — PRAVASTATIN SODIUM 20 MG PO TABS: 20.0000 mg | ORAL_TABLET | Freq: Every evening | ORAL | Status: DC

## 2011-07-03 ENCOUNTER — Encounter: Payer: Self-pay | Admitting: Internal Medicine

## 2011-07-12 ENCOUNTER — Ambulatory Visit: Payer: Self-pay | Admitting: Urology

## 2011-07-23 DIAGNOSIS — N3941 Urge incontinence: Secondary | ICD-10-CM | POA: Diagnosis not present

## 2011-07-23 DIAGNOSIS — D4410 Neoplasm of uncertain behavior of unspecified adrenal gland: Secondary | ICD-10-CM | POA: Diagnosis not present

## 2011-07-23 DIAGNOSIS — N318 Other neuromuscular dysfunction of bladder: Secondary | ICD-10-CM | POA: Diagnosis not present

## 2011-07-23 DIAGNOSIS — R350 Frequency of micturition: Secondary | ICD-10-CM | POA: Diagnosis not present

## 2011-08-02 DIAGNOSIS — N3941 Urge incontinence: Secondary | ICD-10-CM | POA: Diagnosis not present

## 2011-08-17 DIAGNOSIS — M47812 Spondylosis without myelopathy or radiculopathy, cervical region: Secondary | ICD-10-CM | POA: Diagnosis not present

## 2011-08-17 DIAGNOSIS — R51 Headache: Secondary | ICD-10-CM | POA: Diagnosis not present

## 2011-08-23 DIAGNOSIS — N3941 Urge incontinence: Secondary | ICD-10-CM | POA: Diagnosis not present

## 2011-08-31 ENCOUNTER — Telehealth: Payer: Self-pay | Admitting: Internal Medicine

## 2011-08-31 NOTE — Telephone Encounter (Signed)
Left message on machine for patient to call back.

## 2011-08-31 NOTE — Telephone Encounter (Signed)
Pt is having a lot of pain in the lower back and she is asking if she can have x-rays. She is to be having a procedure on 3/5- to have sensors but in her back for bladder control- and she would like to have the x-rays first.  You havent seen her for the back pain but she says you aware of it.

## 2011-08-31 NOTE — Telephone Encounter (Signed)
I don't order x rays over the phone because I need to examine the patient.  Can she be worked in next week?

## 2011-09-03 NOTE — Telephone Encounter (Signed)
Advised pt, appt made for 09/05/11.

## 2011-09-05 ENCOUNTER — Encounter: Payer: Self-pay | Admitting: Internal Medicine

## 2011-09-05 ENCOUNTER — Ambulatory Visit (INDEPENDENT_AMBULATORY_CARE_PROVIDER_SITE_OTHER): Payer: Medicare Other | Admitting: Internal Medicine

## 2011-09-05 ENCOUNTER — Ambulatory Visit (INDEPENDENT_AMBULATORY_CARE_PROVIDER_SITE_OTHER)
Admission: RE | Admit: 2011-09-05 | Discharge: 2011-09-05 | Disposition: A | Discharge status: Home / Self Care | Payer: Medicare Other | Source: Ambulatory Visit | Attending: Internal Medicine | Admitting: Internal Medicine

## 2011-09-05 VITALS — BP 154/76 | HR 96 | Temp 98.7°F | Wt 229.0 lb

## 2011-09-05 DIAGNOSIS — M5380 Other specified dorsopathies, site unspecified: Secondary | ICD-10-CM | POA: Diagnosis not present

## 2011-09-05 DIAGNOSIS — M5440 Lumbago with sciatica, unspecified side: Secondary | ICD-10-CM

## 2011-09-05 DIAGNOSIS — M5430 Sciatica, unspecified side: Secondary | ICD-10-CM

## 2011-09-05 DIAGNOSIS — B351 Tinea unguium: Secondary | ICD-10-CM | POA: Diagnosis not present

## 2011-09-05 DIAGNOSIS — M79609 Pain in unspecified limb: Secondary | ICD-10-CM | POA: Diagnosis not present

## 2011-09-05 DIAGNOSIS — M47817 Spondylosis without myelopathy or radiculopathy, lumbosacral region: Secondary | ICD-10-CM | POA: Diagnosis not present

## 2011-09-05 MED ORDER — OXYCODONE-ACETAMINOPHEN 5-325 MG PO TABS: 1.0000 | ORAL_TABLET | Freq: Three times a day (TID) | ORAL | Status: AC | PRN

## 2011-09-05 NOTE — Patient Instructions (Signed)
Use the percocet in the evening for severe pain ,  Or if needed during the day as well  Plain films of lumbar spine at [facility] this afternoon

## 2011-09-05 NOTE — Assessment & Plan Note (Signed)
Plain films of lumbar spine today were concerning for spinal stenosis vs nerve root compression due to osteophyte formation and loss of disk space at multiple levels.  She will need an MRI for further evaluation.  Rx percocet for improved pain control at night,  continue Vicodin for daytime relief of pain

## 2011-09-05 NOTE — Progress Notes (Signed)
Subjective:    Patient ID: Denise Sharp, female    DOB: 11-11-1945, 66 y.o.   MRN: [id number]  HPI  Denise Sharp is 66 yr old with history of  chronic back pain and prior remote cervical spine injury who presents with new onset back pain which has been present for about 2 months,  Her pain starts at the base of her sine and goes to her umbilical level.  It is severe at times,  requiring use of 2 vicodin per night,   Radiates down left leg to the toes ,  Intermittent,  Occurs when lying flat on back ,  But she states that supine is also her most comfortable position. The pain is aggravated by bending or stooping.   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 She denies foot drop but has been stumbling due to ankle joint disease and enthesopathy.   Prior evaluations include urologic (she was sent for UA and x rays to rule out kidney stone by Dr. Jacqlyn Larsen) and by .  Dr. Ollen Barges, her neurologist who opined that her current back pain is not due to her  cervical  radiculopathy . She is scheduled for an  Interstim trial in  early March by Jacqlyn Larsen (March 5 to March 11) and is concerned that she may a a herniated disk with sciatic nerve compression that may need correction prior to any placement of an interstim device.  Past Medical History  Diagnosis Date  . Diabetes mellitus   . Depression   . Hypertension   . Cervical spinal stenosis 1994    due to trauma to back ([employer] accident), has intermittent paralysis and parasthesias  . Adrenal mass   . IBS (irritable bowel syndrome)   . Obstructive sleep apnea   . Anemia     iron deficient post  2 unit txfsn 2009, normal endo/colonoscopy by [facility]    Current Outpatient Prescriptions on File Prior to Visit  Medication Sig Dispense Refill  . cyanocobalamin (,VITAMIN B-12,) 1000 MCG/ML injection Inject 1 mL (1,000 mcg total) into the muscle  every 30 (thirty) days.  10 mL  3  . diazepam (VALIUM) 2 MG tablet Take 5 mg by mouth at bedtime as needed.        [name] estradiol (ESTRACE) 0.5 MG tablet Take 1 tablet (0.5 mg total) by mouth daily.  30 tablet  3  . hydrocodone-acetaminophen (LORCET-HD) 5-500 MG per capsule Take 1 capsule by mouth every 6 (six) hours as needed.        [name] imipramine (TOFRANIL) 50 MG tablet Take 50 mg by mouth at bedtime.        . Insulin Aspart Prot & Aspart (NOVOLOG MIX 70/30 FLEXPEN [place]) Inject into the skin as needed. Sliding scale      . Loperamide HCl (IMODIUM PO) Take by mouth.        . nebivolol (BYSTOLIC) 10 MG tablet Take 10 mg by mouth daily.        [name] omeprazole (PRILOSEC) 40 MG capsule Take 1 capsule (40 mg total) by mouth 2 (two) times daily.  60 capsule  3  . oxyCODONE-acetaminophen (PERCOCET) 5-325 MG per tablet Take 1 tablet by mouth every 6 (six) hours as needed.        . pravastatin (PRAVACHOL) 20 MG tablet Take 1 tablet (20 mg total) by mouth every evening.  90 tablet  3  . rOPINIRole (REQUIP) 1 MG tablet Take 1 tablet (1 mg total) by mouth at bedtime.  30 tablet  5  . sitaGLIPtin (JANUVIA) 100 MG tablet Take 100 mg by mouth daily.        [name] telmisartan-hydrochlorothiazide (MICARDIS HCT) 80-12.5 MG per tablet Take 1 tablet by mouth daily.        . traMADol (ULTRAM) 50 MG tablet Take 50 mg by mouth 4 (four) times daily.          Review of Systems  Constitutional: Positive for fatigue. Negative for fever, chills, appetite change and unexpected weight change.  HENT: Negative for hearing loss, ear pain, nosebleeds, congestion, sore throat, facial swelling, rhinorrhea, sneezing, mouth sores, trouble swallowing, neck pain, neck stiffness, voice change, postnasal drip, sinus pressure, tinnitus and ear discharge.   Eyes: Negative for pain, discharge, redness and visual disturbance.  Respiratory: Positive for apnea. Negative for cough, chest tightness, shortness of breath, wheezing and stridor.     Cardiovascular: Negative for chest pain, palpitations and leg swelling.  Gastrointestinal: Negative for nausea, vomiting, abdominal pain, diarrhea, constipation, blood in stool, abdominal distention and anal bleeding.  Genitourinary: Negative for dysuria and flank pain.  Musculoskeletal: Positive for back pain and arthralgias. Negative for myalgias and gait problem.  Skin: Negative for color change and rash.  Neurological: Positive for weakness and light-headedness. Negative for dizziness and headaches.  Hematological: Negative for adenopathy. Does not bruise/bleed easily.  Psychiatric/Behavioral: Positive for dysphoric mood. Negative for suicidal ideas and sleep disturbance. The patient is not nervous/anxious.        Objective:   Physical Exam  Constitutional: Vital signs are normal. She appears well-developed. She is active.       Morbidly obese with abdominal obesity  HENT:  Head: Normocephalic.  Eyes: EOM are normal. Pupils are equal, round, and reactive to light.  Neck: Normal range of motion. No thyromegaly present.  Abdominal: Soft. Bowel sounds are normal.  Neurological: She is alert. She has normal strength. A sensory deficit is present. No cranial nerve deficit. Gait abnormal. Coordination normal.  Reflex Scores:      Patellar reflexes are 3+ on the right side and 4+ on the left side.      Achilles reflexes are 3+ on the right side and 4+ on the left side.      antalgic , waddling gait  Skin: Skin is warm and dry.  Psychiatric: She has a normal mood and affect.    Strenth in lower legs is ok,  Reflexes are brisk .       Assessment & Plan:   Sciatica Plain films of lumbar spine today were concerning for spinal stenosis vs nerve root compression due to osteophyte formation and loss of disk space at multiple levels.  She will need an MRI for further evaluation.  Rx percocet for improved pain control at night,  continue Vicodin for daytime relief of pain     Updated  Medication List Outpatient Encounter Prescriptions as of 09/05/2011  Medication Sig Dispense Refill  . cyanocobalamin (,VITAMIN B-12,) 1000 MCG/ML injection Inject 1 mL (1,000 mcg total) into the muscle every 30 (thirty) days.  10 mL  3  . diazepam (VALIUM) 2 MG tablet Take 5 mg by mouth at bedtime as needed.        [name] estradiol (ESTRACE) 0.5 MG tablet Take 1 tablet (0.5 mg total) by mouth daily.  30 tablet  3  . hydrocodone-acetaminophen (LORCET-HD) 5-500 MG per capsule Take  1 capsule by mouth every 6 (six) hours as needed.        [name] imipramine (TOFRANIL) 50 MG tablet Take 50 mg by mouth at bedtime.        . Insulin Aspart Prot & Aspart (NOVOLOG MIX 70/30 FLEXPEN [place]) Inject into the skin as needed. Sliding scale      . Loperamide HCl (IMODIUM PO) Take by mouth.        . nebivolol (BYSTOLIC) 10 MG tablet Take 10 mg by mouth daily.        [name] omeprazole (PRILOSEC) 40 MG capsule Take 1 capsule (40 mg total) by mouth 2 (two) times daily.  60 capsule  3  . oxyCODONE-acetaminophen (PERCOCET) 5-325 MG per tablet Take 1 tablet by mouth every 6 (six) hours as needed.        . pravastatin (PRAVACHOL) 20 MG tablet Take 1 tablet (20 mg total) by mouth every evening.  90 tablet  3  . pregabalin (LYRICA) 50 MG capsule Take one by mouth up to 3 times a day.      [name] rOPINIRole (REQUIP) 1 MG tablet Take 1 tablet (1 mg total) by mouth at bedtime.  30 tablet  5  . sitaGLIPtin (JANUVIA) 100 MG tablet Take 100 mg by mouth daily.        [name] telmisartan-hydrochlorothiazide (MICARDIS HCT) 80-12.5 MG per tablet Take 1 tablet by mouth daily.        . traMADol (ULTRAM) 50 MG tablet Take 50 mg by mouth 4 (four) times daily.        [name] oxyCODONE-acetaminophen (ROXICET) 5-325 MG per tablet Take 1 tablet by mouth every 8 (eight) hours as needed for pain.  90 tablet  0

## 2011-09-11 ENCOUNTER — Ambulatory Visit (HOSPITAL_COMMUNITY)
Admission: RE | Admit: 2011-09-11 | Discharge: 2011-09-11 | Disposition: A | Discharge status: Home / Self Care | Payer: Medicare Other | Source: Ambulatory Visit | Attending: Internal Medicine | Admitting: Internal Medicine

## 2011-09-11 DIAGNOSIS — M47817 Spondylosis without myelopathy or radiculopathy, lumbosacral region: Secondary | ICD-10-CM | POA: Insufficient documentation

## 2011-09-11 DIAGNOSIS — R209 Unspecified disturbances of skin sensation: Secondary | ICD-10-CM | POA: Insufficient documentation

## 2011-09-11 DIAGNOSIS — R159 Full incontinence of feces: Secondary | ICD-10-CM | POA: Insufficient documentation

## 2011-09-11 DIAGNOSIS — M5440 Lumbago with sciatica, unspecified side: Secondary | ICD-10-CM

## 2011-09-11 DIAGNOSIS — R32 Unspecified urinary incontinence: Secondary | ICD-10-CM | POA: Diagnosis not present

## 2011-09-11 DIAGNOSIS — M51379 Other intervertebral disc degeneration, lumbosacral region without mention of lumbar back pain or lower extremity pain: Secondary | ICD-10-CM | POA: Insufficient documentation

## 2011-09-11 DIAGNOSIS — Q762 Congenital spondylolisthesis: Secondary | ICD-10-CM | POA: Insufficient documentation

## 2011-09-11 DIAGNOSIS — M545 Low back pain, unspecified: Secondary | ICD-10-CM | POA: Diagnosis not present

## 2011-09-11 DIAGNOSIS — M5137 Other intervertebral disc degeneration, lumbosacral region: Secondary | ICD-10-CM | POA: Diagnosis not present

## 2011-09-11 DIAGNOSIS — M5126 Other intervertebral disc displacement, lumbar region: Secondary | ICD-10-CM | POA: Diagnosis not present

## 2011-09-12 ENCOUNTER — Other Ambulatory Visit: Payer: Self-pay | Admitting: Internal Medicine

## 2011-09-12 DIAGNOSIS — M545 Low back pain, unspecified: Secondary | ICD-10-CM

## 2011-09-13 ENCOUNTER — Ambulatory Visit: Payer: Self-pay | Admitting: Urology

## 2011-09-13 DIAGNOSIS — R350 Frequency of micturition: Secondary | ICD-10-CM | POA: Diagnosis not present

## 2011-09-13 DIAGNOSIS — I499 Cardiac arrhythmia, unspecified: Secondary | ICD-10-CM | POA: Diagnosis not present

## 2011-09-13 DIAGNOSIS — I498 Other specified cardiac arrhythmias: Secondary | ICD-10-CM | POA: Diagnosis not present

## 2011-09-13 DIAGNOSIS — Z01812 Encounter for preprocedural laboratory examination: Secondary | ICD-10-CM | POA: Diagnosis not present

## 2011-09-13 DIAGNOSIS — N3941 Urge incontinence: Secondary | ICD-10-CM | POA: Diagnosis not present

## 2011-09-13 DIAGNOSIS — R609 Edema, unspecified: Secondary | ICD-10-CM | POA: Diagnosis not present

## 2011-09-13 DIAGNOSIS — K219 Gastro-esophageal reflux disease without esophagitis: Secondary | ICD-10-CM | POA: Diagnosis not present

## 2011-09-13 DIAGNOSIS — Z01810 Encounter for preprocedural cardiovascular examination: Secondary | ICD-10-CM | POA: Diagnosis not present

## 2011-09-13 LAB — BASIC METABOLIC PANEL
Anion Gap: 12 (ref 7–16)
BUN: 7 mg/dL (ref 7–18)
Calcium, Total: 8.8 mg/dL (ref 8.5–10.1)
Chloride: 106 mmol/L (ref 98–107)
Co2: 27 mmol/L (ref 21–32)
Creatinine: 0.81 mg/dL (ref 0.60–1.30)
EGFR (African American): >60
EGFR (Non-African Amer.): >60
Glucose: 126 mg/dL — ABNORMAL HIGH (ref 65–99)
Osmolality: 288 (ref 275–301)
Potassium: 3.4 mmol/L — ABNORMAL LOW (ref 3.5–5.1)
Sodium: 145 mmol/L (ref 136–145)

## 2011-09-13 LAB — HEMOGLOBIN: HGB: 10.4 g/dL — ABNORMAL LOW (ref 12.0–16.0)

## 2011-09-19 ENCOUNTER — Other Ambulatory Visit: Payer: Self-pay | Admitting: Neurology

## 2011-09-19 DIAGNOSIS — M549 Dorsalgia, unspecified: Secondary | ICD-10-CM

## 2011-09-20 DIAGNOSIS — M5137 Other intervertebral disc degeneration, lumbosacral region: Secondary | ICD-10-CM | POA: Diagnosis not present

## 2011-09-20 DIAGNOSIS — M545 Low back pain, unspecified: Secondary | ICD-10-CM | POA: Diagnosis not present

## 2011-09-20 DIAGNOSIS — M47817 Spondylosis without myelopathy or radiculopathy, lumbosacral region: Secondary | ICD-10-CM | POA: Diagnosis not present

## 2011-10-01 ENCOUNTER — Other Ambulatory Visit: Payer: Self-pay | Admitting: Internal Medicine

## 2011-10-01 MED ORDER — DIAZEPAM 5 MG PO TABS
5.0000 mg | ORAL_TABLET | Freq: Every evening | ORAL | Status: AC | PRN
Start: 2011-10-01 — End: 2011-10-11

## 2011-10-02 ENCOUNTER — Ambulatory Visit: Payer: Self-pay | Admitting: Urology

## 2011-10-02 DIAGNOSIS — Z8711 Personal history of peptic ulcer disease: Secondary | ICD-10-CM | POA: Diagnosis not present

## 2011-10-02 DIAGNOSIS — Z794 Long term (current) use of insulin: Secondary | ICD-10-CM | POA: Diagnosis not present

## 2011-10-02 DIAGNOSIS — Z96659 Presence of unspecified artificial knee joint: Secondary | ICD-10-CM | POA: Diagnosis not present

## 2011-10-02 DIAGNOSIS — N3941 Urge incontinence: Secondary | ICD-10-CM | POA: Diagnosis not present

## 2011-10-02 DIAGNOSIS — J4489 Other specified chronic obstructive pulmonary disease: Secondary | ICD-10-CM | POA: Diagnosis not present

## 2011-10-02 DIAGNOSIS — K219 Gastro-esophageal reflux disease without esophagitis: Secondary | ICD-10-CM | POA: Diagnosis not present

## 2011-10-02 DIAGNOSIS — G4730 Sleep apnea, unspecified: Secondary | ICD-10-CM | POA: Diagnosis not present

## 2011-10-02 DIAGNOSIS — Z87891 Personal history of nicotine dependence: Secondary | ICD-10-CM | POA: Diagnosis not present

## 2011-10-02 DIAGNOSIS — Z833 Family history of diabetes mellitus: Secondary | ICD-10-CM | POA: Diagnosis not present

## 2011-10-02 DIAGNOSIS — R339 Retention of urine, unspecified: Secondary | ICD-10-CM | POA: Diagnosis not present

## 2011-10-02 DIAGNOSIS — Z9884 Bariatric surgery status: Secondary | ICD-10-CM | POA: Diagnosis not present

## 2011-10-02 DIAGNOSIS — G609 Hereditary and idiopathic neuropathy, unspecified: Secondary | ICD-10-CM | POA: Diagnosis not present

## 2011-10-02 DIAGNOSIS — Z803 Family history of malignant neoplasm of breast: Secondary | ICD-10-CM | POA: Diagnosis not present

## 2011-10-02 DIAGNOSIS — R609 Edema, unspecified: Secondary | ICD-10-CM | POA: Diagnosis not present

## 2011-10-02 DIAGNOSIS — I2510 Atherosclerotic heart disease of native coronary artery without angina pectoris: Secondary | ICD-10-CM | POA: Diagnosis not present

## 2011-10-02 DIAGNOSIS — Z8249 Family history of ischemic heart disease and other diseases of the circulatory system: Secondary | ICD-10-CM | POA: Diagnosis not present

## 2011-10-02 DIAGNOSIS — M1990 Unspecified osteoarthritis, unspecified site: Secondary | ICD-10-CM | POA: Diagnosis not present

## 2011-10-02 DIAGNOSIS — Z8042 Family history of malignant neoplasm of prostate: Secondary | ICD-10-CM | POA: Diagnosis not present

## 2011-10-02 DIAGNOSIS — Z79899 Other long term (current) drug therapy: Secondary | ICD-10-CM | POA: Diagnosis not present

## 2011-10-02 DIAGNOSIS — D166 Benign neoplasm of vertebral column: Secondary | ICD-10-CM | POA: Diagnosis not present

## 2011-10-02 DIAGNOSIS — R350 Frequency of micturition: Secondary | ICD-10-CM | POA: Diagnosis not present

## 2011-10-02 DIAGNOSIS — I498 Other specified cardiac arrhythmias: Secondary | ICD-10-CM | POA: Diagnosis not present

## 2011-10-02 DIAGNOSIS — J449 Chronic obstructive pulmonary disease, unspecified: Secondary | ICD-10-CM | POA: Diagnosis not present

## 2011-10-02 DIAGNOSIS — E109 Type 1 diabetes mellitus without complications: Secondary | ICD-10-CM | POA: Diagnosis not present

## 2011-10-02 DIAGNOSIS — D649 Anemia, unspecified: Secondary | ICD-10-CM | POA: Diagnosis not present

## 2011-10-02 DIAGNOSIS — E669 Obesity, unspecified: Secondary | ICD-10-CM | POA: Diagnosis not present

## 2011-10-08 DIAGNOSIS — D4410 Neoplasm of uncertain behavior of unspecified adrenal gland: Secondary | ICD-10-CM | POA: Diagnosis not present

## 2011-10-08 DIAGNOSIS — N3941 Urge incontinence: Secondary | ICD-10-CM | POA: Diagnosis not present

## 2011-10-08 DIAGNOSIS — N318 Other neuromuscular dysfunction of bladder: Secondary | ICD-10-CM | POA: Diagnosis not present

## 2011-10-08 DIAGNOSIS — R350 Frequency of micturition: Secondary | ICD-10-CM | POA: Diagnosis not present

## 2011-10-15 HISTORY — PX: INTERSTIM IMPLANT PLACEMENT: SHX5130

## 2011-10-16 ENCOUNTER — Ambulatory Visit: Payer: Self-pay | Admitting: Urology

## 2011-10-16 DIAGNOSIS — R159 Full incontinence of feces: Secondary | ICD-10-CM | POA: Diagnosis not present

## 2011-10-16 DIAGNOSIS — Z8042 Family history of malignant neoplasm of prostate: Secondary | ICD-10-CM | POA: Diagnosis not present

## 2011-10-16 DIAGNOSIS — M1990 Unspecified osteoarthritis, unspecified site: Secondary | ICD-10-CM | POA: Diagnosis not present

## 2011-10-16 DIAGNOSIS — G4730 Sleep apnea, unspecified: Secondary | ICD-10-CM | POA: Diagnosis not present

## 2011-10-16 DIAGNOSIS — R609 Edema, unspecified: Secondary | ICD-10-CM | POA: Diagnosis not present

## 2011-10-16 DIAGNOSIS — J4489 Other specified chronic obstructive pulmonary disease: Secondary | ICD-10-CM | POA: Diagnosis not present

## 2011-10-16 DIAGNOSIS — I498 Other specified cardiac arrhythmias: Secondary | ICD-10-CM | POA: Diagnosis not present

## 2011-10-16 DIAGNOSIS — F17200 Nicotine dependence, unspecified, uncomplicated: Secondary | ICD-10-CM | POA: Diagnosis not present

## 2011-10-16 DIAGNOSIS — J449 Chronic obstructive pulmonary disease, unspecified: Secondary | ICD-10-CM | POA: Diagnosis not present

## 2011-10-16 DIAGNOSIS — R350 Frequency of micturition: Secondary | ICD-10-CM | POA: Diagnosis not present

## 2011-10-16 DIAGNOSIS — I2510 Atherosclerotic heart disease of native coronary artery without angina pectoris: Secondary | ICD-10-CM | POA: Diagnosis not present

## 2011-10-16 DIAGNOSIS — K219 Gastro-esophageal reflux disease without esophagitis: Secondary | ICD-10-CM | POA: Diagnosis not present

## 2011-10-16 DIAGNOSIS — Z794 Long term (current) use of insulin: Secondary | ICD-10-CM | POA: Diagnosis not present

## 2011-10-16 DIAGNOSIS — IMO0002 Reserved for concepts with insufficient information to code with codable children: Secondary | ICD-10-CM | POA: Diagnosis not present

## 2011-10-16 DIAGNOSIS — Z9884 Bariatric surgery status: Secondary | ICD-10-CM | POA: Diagnosis not present

## 2011-10-16 DIAGNOSIS — G43909 Migraine, unspecified, not intractable, without status migrainosus: Secondary | ICD-10-CM | POA: Diagnosis not present

## 2011-10-16 DIAGNOSIS — Z8711 Personal history of peptic ulcer disease: Secondary | ICD-10-CM | POA: Diagnosis not present

## 2011-10-16 DIAGNOSIS — Z79899 Other long term (current) drug therapy: Secondary | ICD-10-CM | POA: Diagnosis not present

## 2011-10-16 DIAGNOSIS — K589 Irritable bowel syndrome without diarrhea: Secondary | ICD-10-CM | POA: Diagnosis not present

## 2011-10-16 DIAGNOSIS — M549 Dorsalgia, unspecified: Secondary | ICD-10-CM | POA: Diagnosis not present

## 2011-10-16 DIAGNOSIS — N3941 Urge incontinence: Secondary | ICD-10-CM | POA: Diagnosis not present

## 2011-10-16 DIAGNOSIS — D649 Anemia, unspecified: Secondary | ICD-10-CM | POA: Diagnosis not present

## 2011-10-16 DIAGNOSIS — E109 Type 1 diabetes mellitus without complications: Secondary | ICD-10-CM | POA: Diagnosis not present

## 2011-10-16 DIAGNOSIS — G609 Hereditary and idiopathic neuropathy, unspecified: Secondary | ICD-10-CM | POA: Diagnosis not present

## 2011-10-16 DIAGNOSIS — E669 Obesity, unspecified: Secondary | ICD-10-CM | POA: Diagnosis not present

## 2011-10-16 DIAGNOSIS — G2581 Restless legs syndrome: Secondary | ICD-10-CM | POA: Diagnosis not present

## 2011-10-29 DIAGNOSIS — R350 Frequency of micturition: Secondary | ICD-10-CM | POA: Diagnosis not present

## 2011-11-01 ENCOUNTER — Encounter: Payer: Self-pay | Admitting: Internal Medicine

## 2011-11-01 ENCOUNTER — Ambulatory Visit (INDEPENDENT_AMBULATORY_CARE_PROVIDER_SITE_OTHER): Payer: Medicare Other | Admitting: Internal Medicine

## 2011-11-01 VITALS — BP 132/78 | HR 83 | Temp 98.2°F | Resp 16 | Wt 227.0 lb

## 2011-11-01 DIAGNOSIS — I10 Essential (primary) hypertension: Secondary | ICD-10-CM | POA: Diagnosis not present

## 2011-11-01 DIAGNOSIS — E08610 Diabetes mellitus due to underlying condition with diabetic neuropathic arthropathy: Secondary | ICD-10-CM

## 2011-11-01 DIAGNOSIS — E1349 Other specified diabetes mellitus with other diabetic neurological complication: Secondary | ICD-10-CM | POA: Diagnosis not present

## 2011-11-01 DIAGNOSIS — E1142 Type 2 diabetes mellitus with diabetic polyneuropathy: Secondary | ICD-10-CM

## 2011-11-01 DIAGNOSIS — I209 Angina pectoris, unspecified: Secondary | ICD-10-CM | POA: Diagnosis not present

## 2011-11-01 DIAGNOSIS — R0609 Other forms of dyspnea: Secondary | ICD-10-CM | POA: Diagnosis not present

## 2011-11-01 DIAGNOSIS — R0989 Other specified symptoms and signs involving the circulatory and respiratory systems: Secondary | ICD-10-CM | POA: Diagnosis not present

## 2011-11-01 DIAGNOSIS — IMO0002 Reserved for concepts with insufficient information to code with codable children: Secondary | ICD-10-CM

## 2011-11-01 DIAGNOSIS — E785 Hyperlipidemia, unspecified: Secondary | ICD-10-CM

## 2011-11-01 NOTE — Assessment & Plan Note (Addendum)
Has stopped the [place] and metformin since her surgery, due to diarrhea  Wants to continue suspension of both for now.  Continue Lyrica for neuropathy.  Recheck labs in late May.  Reminder for diabetic eye exam given,.

## 2011-11-01 NOTE — Progress Notes (Addendum)
Patient ID: Denise Sharp, female   DOB: 12/17/1945, 66 y.o.   MRN: [id number]  Patient Active Problem List  Diagnoses  . Hypersomnolence  . Hypertension  . 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 Hearing loss, right  . Adrenal mass  . Irritable bowel syndrome  . Restless legs syndrome  . Degenerative disk disease  . Cervical stenosis of spinal canal  . Sciatica    Subjective:  CC:   Chief Complaint  Patient presents with  . Fill out Form    HPI:   Denise Sharp a 66 y.o. female who presents  For follow up on back pain, urinary incontinence , diabetes mellitus , and other chronic medical issues.  She is S/p stimulator implant (InterStim )by Jacqlyn Larsen April 2.   Initially it was helping with the incontinence but at her  post op April 17th her incontinence had recurred so it has been reprogrammed.  The constant little shocks are making it very uncomfortable to sit. It aggravates her lumbar back pain and she is requesting a back brace that she can wear with her internal stimulator.  Has a history of low back pain secondary to spinal stenosis at L5 and degenerative changes at multiple levels.  History of surgery over 15 years ago by neurosurgeon Leeroy Cha, who examined her recently for worsening pain and  recommended deferring surgery for now since she is able to ambulate without assistance.  She has daily chronic pain managed with occsaional use of vicodin or percocet. It is aggravated by prolonged standing,  stooping or bending. Regarding her diabetes, since she surgery she has suspended her metformin and januvia due to diarrhea.  Blood sugars have been < 150 when she checks, which is once daily at variable times. She has also suspended her Bystolic because her blood pressures have been runnign on the low side since the bladder surgery. She has a history of OSA, and is wearing her CPAP every night but continus to have trouble with  insomnia.    Past Medical History  Diagnosis Date  . Diabetes mellitus   . Depression   . Hypertension   . Cervical spinal stenosis 1994    due to trauma to back ([employer] accident), has intermittent paralysis and parasthesias  . Adrenal mass   . IBS (irritable bowel syndrome)   . Obstructive sleep apnea   . Anemia     iron deficient post  2 unit txfsn 2009, normal endo/colonoscopy by [facility]    Past Surgical History  Procedure Date  . Gastric bypass   . Abdominal hysterectomy   . Gastric bypass 2000, 2005    Dr. Debroah Loop  . Appendectomy   . Gallbladder surgery 1976  . Rotator cuff repair     right  . Joint replacement 2007    bilateral knee. [name]  . Spine surgery 1995    [place]         The following portions of the patient's history were reviewed and updated as appropriate: Allergies, current medications, and problem list.    Review of Systems:   A comprehensive ROS was done and positive for back pain and insomnia. The rest is negative.    History   Social History  . Marital Status: Married    Spouse Name: N/A    Number of Children: N/A  . Years of Education: N/A   Occupational History  . Not on file.   Social History Main Topics  .  Smoking status: Never Smoker   . Smokeless tobacco: Never Used  . Alcohol Use: No  . Drug Use: No  . Sexually Active: Not on file   Other Topics Concern  . Not on file   Social History Narrative  . No narrative on file    Objective:  BP 132/78  Pulse 83  Temp(Src) 98.2 F (36.8 C) (Oral)  Resp 16  Wt 227 lb (102.967 kg)  SpO2 98%  General appearance: alert, cooperative and appears stated age Ears: normal TM's and external ear canals both ears Throat: lips, mucosa, and tongue normal; teeth and gums normal Neck: no adenopathy, no carotid bruit, supple, symmetrical, trachea midline and thyroid not enlarged, symmetric, no tenderness/mass/nodules Back: symmetric, no curvature. ROM normal. No CVA  tenderness. Lungs: clear to auscultation bilaterally Heart: regular rate and rhythm, S1, S2 normal, no murmur, click, rub or gallop Abdomen: soft, non-tender; bowel sounds normal; no masses,  no organomegaly Pulses: 2+ and symmetric Skin: Skin color, texture, turgor normal. No rashes or lesions Lymph nodes: Cervical, supraclavicular, and axillary nodes normal.  Assessment and Plan:  Diabetes mellitus due to underlying condition with diabetic neuropathic arthropathy Has stopped the [place] and metformin since her surgery, due to diarrhea  Wants to continue suspension of both for now.  Continue Lyrica for neuropathy.  Recheck labs in late May.  Reminder for diabetic eye exam given,.   Hypertension Has suspended her bystolic since surgery and is normotensive .  Will resume if bp begins to stay above 150 consistently.  Degenerative disk disease At multiple lumbar levels, resulting in spinal stenosis.   Agree with use of back brace to support lower back and provide some pain relief.     Updated Medication List Outpatient Encounter Prescriptions as of 11/01/2011  Medication Sig Dispense Refill  . cyanocobalamin (,VITAMIN B-12,) 1000 MCG/ML injection Inject 1 mL (1,000 mcg total) into the muscle every 30 (thirty) days.  10 mL  3  . estradiol (ESTRACE) 0.5 MG tablet Take 1 tablet (0.5 mg total) by mouth daily.  30 tablet  3  . Loperamide HCl (IMODIUM PO) Take by mouth.        . hydrocodone-acetaminophen (LORCET-HD) 5-500 MG per capsule Take 1 capsule by mouth every 6 (six) hours as needed.        [name] imipramine (TOFRANIL) 50 MG tablet Take 50 mg by mouth at bedtime.        . Insulin Aspart Prot & Aspart (NOVOLOG MIX 70/30 FLEXPEN [place]) Inject into the skin as needed. Sliding scale      . nebivolol (BYSTOLIC) 10 MG tablet Take 10 mg by mouth daily.        [name] omeprazole (PRILOSEC) 40 MG capsule Take 1 capsule (40 mg total) by mouth 2 (two) times daily.  60 capsule  3  . oxyCODONE-acetaminophen  (PERCOCET) 5-325 MG per tablet Take 1 tablet by mouth every 6 (six) hours as needed.        . pravastatin (PRAVACHOL) 20 MG tablet Take 1 tablet (20 mg total) by mouth every evening.  90 tablet  3  . pregabalin (LYRICA) 50 MG capsule Take one by mouth up to 3 times a day.      [name] rOPINIRole (REQUIP) 1 MG tablet Take 1 tablet (1 mg total) by mouth at bedtime.  30 tablet  5  . sitaGLIPtin (JANUVIA) 100 MG tablet Take 100 mg by mouth daily.        [name] telmisartan-hydrochlorothiazide (MICARDIS HCT) 80-12.5  MG per tablet Take 1 tablet by mouth daily.        . traMADol (ULTRAM) 50 MG tablet Take 50 mg by mouth 4 (four) times daily.           Orders Placed This Encounter  Procedures  . Lipid panel  . COMPLETE METABOLIC PANEL WITH GFR  . Hemoglobin A1c  . CBC with Differential  . Microalbumin / creatinine urine ratio    No Follow-up on file.

## 2011-11-01 NOTE — Assessment & Plan Note (Addendum)
Has suspended her bystolic since surgery and is normotensive .  Will resume if bp begins to stay above 150 consistently.

## 2011-11-02 ENCOUNTER — Encounter: Payer: Self-pay | Admitting: Internal Medicine

## 2011-11-02 NOTE — Assessment & Plan Note (Signed)
At multiple lumbar levels, resulting in spinal stenosis.   Agree with use of back brace to support lower back and provide some pain relief.

## 2011-11-02 NOTE — Patient Instructions (Signed)
.  Consider the Low Glycemic Index Diet and 6 smaller meals daily :   7 AM Low carbohydrate Protein  Shakes (EAS Carb Control  Or Atkins ,  Available everywhere,   In  cases at [employer] )  2.5 carbs  (Add or substitute a toasted sandwhich thin w/ peanut butter)  10 AM: Protein bar by Atkins (snack size,  Chocolate lover's variety at  [employer])    Lunch: sandwich on pita bread or flatbread (Joseph's makes a pita bread and a flat bread , available at [employer] and [employer]; Toufayah makes a low carb flatbread available at [employer] and [employer])   3 PM:  Mid day :  Another protein bar,  Or a  cheese stick, 1/4 cup of almonds, walnuts, pistachios, pecans, peanuts,  Macadamia nuts  6 PM  Dinner:  "mean and green:"  Meat/chicken/fish, salad, and green veggie : use ranch, vinagrette,  Blue cheese, etc  9 PM snack : Breyer's low carb fudgiscle or  ice cream bar (Carb Smart) Weight Watcher's ice cream bar , or another protein shake 

## 2011-11-27 ENCOUNTER — Telehealth: Payer: Self-pay | Admitting: Internal Medicine

## 2011-11-27 DIAGNOSIS — B002 Herpesviral gingivostomatitis and pharyngotonsillitis: Secondary | ICD-10-CM

## 2011-11-27 MED ORDER — VALACYCLOVIR HCL 1 G PO TABS: 1000.0000 mg | ORAL_TABLET | Freq: Three times a day (TID) | ORAL | Status: DC

## 2011-11-27 NOTE — Telephone Encounter (Signed)
Sent to [facility]

## 2011-11-27 NOTE — Telephone Encounter (Signed)
Patient called and stated she has been getting fever blisters and wanted to know if you could call in Valtrex for her.  Please advise.

## 2011-11-27 NOTE — Telephone Encounter (Signed)
Patient was notified to check with pharmacy

## 2011-12-03 DIAGNOSIS — M79609 Pain in unspecified limb: Secondary | ICD-10-CM | POA: Diagnosis not present

## 2011-12-03 DIAGNOSIS — B351 Tinea unguium: Secondary | ICD-10-CM | POA: Diagnosis not present

## 2011-12-13 ENCOUNTER — Other Ambulatory Visit (INDEPENDENT_AMBULATORY_CARE_PROVIDER_SITE_OTHER): Payer: Medicare Other | Admitting: *Deleted

## 2011-12-13 DIAGNOSIS — R5383 Other fatigue: Secondary | ICD-10-CM

## 2011-12-13 DIAGNOSIS — N3941 Urge incontinence: Secondary | ICD-10-CM | POA: Diagnosis not present

## 2011-12-13 DIAGNOSIS — R350 Frequency of micturition: Secondary | ICD-10-CM | POA: Diagnosis not present

## 2011-12-13 DIAGNOSIS — E1349 Other specified diabetes mellitus with other diabetic neurological complication: Secondary | ICD-10-CM

## 2011-12-13 DIAGNOSIS — R5381 Other malaise: Secondary | ICD-10-CM

## 2011-12-13 DIAGNOSIS — E1142 Type 2 diabetes mellitus with diabetic polyneuropathy: Secondary | ICD-10-CM

## 2011-12-13 DIAGNOSIS — IMO0001 Reserved for inherently not codable concepts without codable children: Secondary | ICD-10-CM | POA: Diagnosis not present

## 2011-12-13 DIAGNOSIS — N318 Other neuromuscular dysfunction of bladder: Secondary | ICD-10-CM | POA: Diagnosis not present

## 2011-12-13 DIAGNOSIS — E08610 Diabetes mellitus due to underlying condition with diabetic neuropathic arthropathy: Secondary | ICD-10-CM

## 2011-12-13 DIAGNOSIS — E785 Hyperlipidemia, unspecified: Secondary | ICD-10-CM | POA: Diagnosis not present

## 2011-12-13 DIAGNOSIS — D4410 Neoplasm of uncertain behavior of unspecified adrenal gland: Secondary | ICD-10-CM | POA: Diagnosis not present

## 2011-12-13 LAB — CBC WITH DIFFERENTIAL/PLATELET
Basophils Absolute: 0.1 10*3/uL (ref 0.0–0.1)
Basophils Relative: 0.6 % (ref 0.0–3.0)
Eosinophils Absolute: 0.1 10*3/uL (ref 0.0–0.7)
Eosinophils Relative: 1 % (ref 0.0–5.0)
HCT: 29.7 % — ABNORMAL LOW (ref 36.0–46.0)
Hemoglobin: 9.2 g/dL — ABNORMAL LOW (ref 12.0–15.0)
Lymphocytes Relative: 25 % (ref 12.0–46.0)
Lymphs Abs: 2.2 10*3/uL (ref 0.7–4.0)
MCHC: 31 g/dL (ref 30.0–36.0)
MCV: 75 fl — ABNORMAL LOW (ref 78.0–100.0)
Monocytes Absolute: 0.4 10*3/uL (ref 0.1–1.0)
Monocytes Relative: 4.9 % (ref 3.0–12.0)
Neutro Abs: 6.1 10*3/uL (ref 1.4–7.7)
Neutrophils Relative %: 68.5 % (ref 43.0–77.0)
Platelets: 304 10*3/uL (ref 150.0–400.0)
RBC: 3.96 Mil/uL (ref 3.87–5.11)
RDW: 18.8 % — ABNORMAL HIGH (ref 11.5–14.6)
WBC: 8.9 10*3/uL (ref 4.5–10.5)

## 2011-12-13 LAB — LIPID PANEL
Cholesterol: 155 mg/dL (ref 0–200)
HDL: 42.4 mg/dL (ref >39.00)
LDL Cholesterol: 90 mg/dL (ref 0–99)
Total CHOL/HDL Ratio: 4
Triglycerides: 111 mg/dL (ref 0.0–149.0)
VLDL: 22.2 mg/dL (ref 0.0–40.0)

## 2011-12-13 LAB — COMPREHENSIVE METABOLIC PANEL
ALT: 13 U/L (ref 0–35)
AST: 19 U/L (ref 0–37)
Albumin: 3.2 g/dL — ABNORMAL LOW (ref 3.5–5.2)
Alkaline Phosphatase: 96 U/L (ref 39–117)
BUN: 14 mg/dL (ref 6–23)
CO2: 24 mEq/L (ref 19–32)
Calcium: 8 mg/dL — ABNORMAL LOW (ref 8.4–10.5)
Chloride: 106 mEq/L (ref 96–112)
Creatinine, Ser: 0.7 mg/dL (ref 0.4–1.2)
GFR: 84.72 mL/min (ref >60.00)
Glucose, Bld: 184 mg/dL — ABNORMAL HIGH (ref 70–99)
Potassium: 3.5 mEq/L (ref 3.5–5.1)
Sodium: 141 mEq/L (ref 135–145)
Total Bilirubin: 0.3 mg/dL (ref 0.3–1.2)
Total Protein: 6.6 g/dL (ref 6.0–8.3)

## 2011-12-13 LAB — MICROALBUMIN / CREATININE URINE RATIO
Creatinine,U: 155.1 mg/dL
Microalb Creat Ratio: 4.3 mg/g (ref 0.0–30.0)
Microalb, Ur: 6.7 mg/dL — ABNORMAL HIGH (ref 0.0–1.9)

## 2011-12-13 LAB — HEMOGLOBIN A1C: Hgb A1c MFr Bld: 7.6 % — ABNORMAL HIGH (ref 4.6–6.5)

## 2011-12-14 LAB — COMPLETE METABOLIC PANEL WITH GFR
ALT: 9 U/L (ref 0–35)
AST: 14 U/L (ref 0–37)
Albumin: 3.4 g/dL — ABNORMAL LOW (ref 3.5–5.2)
Alkaline Phosphatase: 111 U/L (ref 39–117)
BUN: 13 mg/dL (ref 6–23)
CO2: 21 mEq/L (ref 19–32)
Calcium: 8.5 mg/dL (ref 8.4–10.5)
Chloride: 105 mEq/L (ref 96–112)
Creat: 0.87 mg/dL (ref 0.50–1.10)
GFR, Est African American: 80 mL/min
GFR, Est Non African American: 70 mL/min
Glucose, Bld: 193 mg/dL — ABNORMAL HIGH (ref 70–99)
Potassium: 4.1 mEq/L (ref 3.5–5.3)
Sodium: 140 mEq/L (ref 135–145)
Total Bilirubin: 0.3 mg/dL (ref 0.3–1.2)
Total Protein: 6.2 g/dL (ref 6.0–8.3)

## 2011-12-21 ENCOUNTER — Other Ambulatory Visit (INDEPENDENT_AMBULATORY_CARE_PROVIDER_SITE_OTHER): Payer: Medicare Other | Admitting: *Deleted

## 2011-12-21 ENCOUNTER — Telehealth: Payer: Self-pay | Admitting: *Deleted

## 2011-12-21 DIAGNOSIS — D649 Anemia, unspecified: Secondary | ICD-10-CM | POA: Diagnosis not present

## 2011-12-21 LAB — IRON AND TIBC
%SAT: 4 % — ABNORMAL LOW (ref 20–55)
Iron: 16 ug/dL — ABNORMAL LOW (ref 42–145)
TIBC: 379 ug/dL (ref 250–470)
UIBC: 363 ug/dL (ref 125–400)

## 2011-12-21 LAB — FERRITIN: Ferritin: 7 ng/mL — ABNORMAL LOW (ref 10–291)

## 2011-12-21 NOTE — Telephone Encounter (Signed)
Patient came in today for labs. She also brought in her log of blood sugar readings and a form that she needs you to sign in regards to her CPAP supplies. Both were given to [employer]. To put in your folders.

## 2011-12-24 ENCOUNTER — Other Ambulatory Visit: Payer: Self-pay | Admitting: Internal Medicine

## 2011-12-24 DIAGNOSIS — Z1211 Encounter for screening for malignant neoplasm of colon: Secondary | ICD-10-CM

## 2011-12-24 MED ORDER — FERROUS FUM-IRON POLYSACCH 162-115.2 MG PO CAPS: 1.0000 | ORAL_CAPSULE | Freq: Every day | ORAL | Status: DC

## 2011-12-24 NOTE — Progress Notes (Signed)
Addended by: Deborra Medina on: 12/24/2011 12:38 PM   Modules accepted: Orders

## 2011-12-25 ENCOUNTER — Other Ambulatory Visit: Payer: Medicare Other

## 2011-12-25 DIAGNOSIS — Z1211 Encounter for screening for malignant neoplasm of colon: Secondary | ICD-10-CM

## 2011-12-25 LAB — FECAL OCCULT BLOOD, IMMUNOCHEMICAL: Fecal Occult Bld: NEGATIVE

## 2011-12-28 ENCOUNTER — Telehealth: Payer: Self-pay | Admitting: Internal Medicine

## 2011-12-28 NOTE — Telephone Encounter (Signed)
Pt called to get lab results °

## 2011-12-28 NOTE — Telephone Encounter (Signed)
There are no labs in her chart that she hasn't been informed of. I tried calling her today to see what labs she is asking about, but got no answer. I left her a message asking her to call us back.

## 2012-01-04 ENCOUNTER — Emergency Department: Payer: Self-pay | Admitting: Emergency Medicine

## 2012-01-04 DIAGNOSIS — Z79899 Other long term (current) drug therapy: Secondary | ICD-10-CM | POA: Diagnosis not present

## 2012-01-04 DIAGNOSIS — R52 Pain, unspecified: Secondary | ICD-10-CM | POA: Diagnosis not present

## 2012-01-04 DIAGNOSIS — R071 Chest pain on breathing: Secondary | ICD-10-CM | POA: Diagnosis not present

## 2012-01-04 DIAGNOSIS — J9819 Other pulmonary collapse: Secondary | ICD-10-CM | POA: Diagnosis not present

## 2012-01-04 DIAGNOSIS — I10 Essential (primary) hypertension: Secondary | ICD-10-CM | POA: Diagnosis not present

## 2012-01-04 DIAGNOSIS — R51 Headache: Secondary | ICD-10-CM | POA: Diagnosis not present

## 2012-01-04 DIAGNOSIS — R079 Chest pain, unspecified: Secondary | ICD-10-CM | POA: Diagnosis not present

## 2012-01-04 LAB — BASIC METABOLIC PANEL
Anion Gap: 9 (ref 7–16)
BUN: 12 mg/dL (ref 7–18)
Calcium, Total: 8.5 mg/dL (ref 8.5–10.1)
Chloride: 107 mmol/L (ref 98–107)
Co2: 25 mmol/L (ref 21–32)
Creatinine: 0.75 mg/dL (ref 0.60–1.30)
EGFR (African American): >60
EGFR (Non-African Amer.): >60
Glucose: 132 mg/dL — ABNORMAL HIGH (ref 65–99)
Osmolality: 283 (ref 275–301)
Potassium: 3.9 mmol/L (ref 3.5–5.1)
Sodium: 141 mmol/L (ref 136–145)

## 2012-01-04 LAB — CBC
HCT: 32.3 % — ABNORMAL LOW (ref 35.0–47.0)
HGB: 9.8 g/dL — ABNORMAL LOW (ref 12.0–16.0)
MCH: 23.1 pg — ABNORMAL LOW (ref 26.0–34.0)
MCHC: 30.5 g/dL — ABNORMAL LOW (ref 32.0–36.0)
MCV: 76 fL — ABNORMAL LOW (ref 80–100)
Platelet: 356 10*3/uL (ref 150–440)
RBC: 4.26 10*6/uL (ref 3.80–5.20)
RDW: 18.7 % — ABNORMAL HIGH (ref 11.5–14.5)
WBC: 11.8 10*3/uL — ABNORMAL HIGH (ref 3.6–11.0)

## 2012-01-04 LAB — CK TOTAL AND CKMB (NOT AT ARMC)
CK, Total: 153 U/L (ref 21–215)
CK-MB: 1.5 ng/mL (ref 0.5–3.6)

## 2012-01-04 LAB — TROPONIN I: Troponin-I: <0.02 ng/mL

## 2012-01-05 DIAGNOSIS — R51 Headache: Secondary | ICD-10-CM | POA: Diagnosis not present

## 2012-01-05 LAB — SEDIMENTATION RATE: Erythrocyte Sed Rate: 68 mm/hr — ABNORMAL HIGH (ref 0–30)

## 2012-01-05 LAB — TROPONIN I: Troponin-I: <0.02 ng/mL

## 2012-01-07 ENCOUNTER — Encounter: Payer: Self-pay | Admitting: Internal Medicine

## 2012-01-07 ENCOUNTER — Ambulatory Visit (INDEPENDENT_AMBULATORY_CARE_PROVIDER_SITE_OTHER): Payer: Medicare Other | Admitting: Internal Medicine

## 2012-01-07 VITALS — BP 140/70 | HR 58 | Temp 99.0°F | Ht 64.0 in | Wt 214.0 lb

## 2012-01-07 DIAGNOSIS — M316 Other giant cell arteritis: Secondary | ICD-10-CM

## 2012-01-07 DIAGNOSIS — R519 Headache, unspecified: Secondary | ICD-10-CM | POA: Insufficient documentation

## 2012-01-07 DIAGNOSIS — R51 Headache: Secondary | ICD-10-CM | POA: Diagnosis not present

## 2012-01-07 DIAGNOSIS — I447 Left bundle-branch block, unspecified: Secondary | ICD-10-CM

## 2012-01-07 DIAGNOSIS — H53139 Sudden visual loss, unspecified eye: Secondary | ICD-10-CM

## 2012-01-07 DIAGNOSIS — H539 Unspecified visual disturbance: Secondary | ICD-10-CM | POA: Diagnosis not present

## 2012-01-07 MED ORDER — NEBIVOLOL HCL 5 MG PO TABS: 5.0000 mg | ORAL_TABLET | Freq: Every day | ORAL | Status: DC

## 2012-01-07 NOTE — Assessment & Plan Note (Addendum)
She has a LBBB on recent  EKG that was not present in February 2013 but was present on prior EKGS in 2011/2012 per review of [place].  The question is whether the conduction delay is truly new and/or somehow impacted by her bladder interstim device Will refer to [place] for cardiac evaluation given recent episode of chest pain and multiple risk factors.

## 2012-01-07 NOTE — Assessment & Plan Note (Addendum)
I have reviewed the ER records and her prior MRI cervical spine an d neurology notes. Given her history of cervical spinal stenosis , it is unclear whether her recent headache escalation is due to same or due to temporal arteritis as suspected by the ED physician.  ESR was elevated at 63.  Will order MRI/MRA head with contrast and refer t [facility] for temporal artery biopsy. I have stopped her steroids after 3 days of use, since she has a preexisting headache etiology of spinal stenosis and her the diabetes is not out of control. Resume tizanidine and vicodin for pain control.

## 2012-01-07 NOTE — Patient Instructions (Addendum)
Stop the prednisone  Immediately.   We will set you up with a vascular evaluation of the arteries in your head to rule out temporal arteritis.   You may use the tizanidine and hydrocodone for pain control.

## 2012-01-07 NOTE — Progress Notes (Signed)
Patient ID: Denise Sharp, female   DOB: 1945/09/30, 66 y.o.   MRN: [id number] Patient Active Problem List  Diagnosis  . Hypersomnolence  . Hypertension  . 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 Hearing loss, right  . Adrenal mass  . Irritable bowel syndrome  . Restless legs syndrome  . Degenerative disk disease  . Cervical stenosis of spinal canal  . Sciatica  . Headache, temporal  . Bundle branch block left    Subjective:  CC:   Chief Complaint  Patient presents with  . hospital f/u    HPI:   Denise Sharp a 66 y.o. female who presents  With recent onset of Headache, elevated bp and chest pain. Patient was evaluated and treated in the ER on June 20th for gradually worsening headache and chest pain accompanied by blurred vision.  Prior to going to the ER she resumed Bystolic for elevated bp of 165/103 on Friday night to 167/104 She called the triage RN/answering service for persistently elevated bp despite taking 10 mg Bystolic and new onset chest pain radiating to left shoulder with left arm paraesthesias and was referred to ER.  By the time she was evaluated in the ER her bp had improved to 120/54  and she sat in the ER for 6 hours for observation then they tried to send her home when her cardiac enzymes were normal.  EKG showed LBBB. Unclear whether they had an old one for comparison.  She refused to leave because of her headache, which she described to the second ER physician as initiating in the occipital region but accompanied by bilateral temple pain,  Visual changes and occasional stabbing shooting pain behind the left eye and numbness of the left face.  The second ER doc ordered an ESR and head CT non contrast and told her she had arteritis and treated her with  IV solumedrol 100 mg and oral prednisone 50 mg with 20 day course of 50 mg .  She is on Day 3 of 50 mg of prednisone with no significant change in headache or  blurred vision.  Her blood sugars have been over 200 since Saturday. She has chronic headaches treated by neurologist Floyde Parkins, and history of cervical spondylosis resulting in stenosis  by cervical spine MRI done April 2013.     Past Medical History  Diagnosis Date  . Diabetes mellitus   . Depression   . Hypertension   . Cervical spinal stenosis 1994    due to trauma to back ([employer] accident), has intermittent paralysis and parasthesias  . Adrenal mass   . IBS (irritable bowel syndrome)   . Obstructive sleep apnea   . Anemia     iron deficient post  2 unit txfsn 2009, normal endo/colonoscopy by [facility] Norris    Past Surgical History  Procedure Date  . Gastric bypass   . Abdominal hysterectomy   . Gastric bypass 2000, 2005    Dr. Debroah Loop  . Appendectomy   . Gallbladder surgery 1976  . Rotator cuff repair     right  . Joint replacement 2007    bilateral knee. [name],  [name]  . Spine surgery 1995    [place]         The following portions of the patient's history were reviewed and updated as appropriate: Allergies, current medications, and problem list.    Review of Systems:   12 Pt  review of systems was negative except those addressed in  the HPI,     History   Social History  . Marital Status: Married    Spouse Name: N/A    Number of Children: N/A  . Years of Education: N/A   Occupational History  . Not on file.   Social History Main Topics  . Smoking status: Never Smoker   . Smokeless tobacco: Never Used  . Alcohol Use: No  . Drug Use: No  . Sexually Active: Not on file   Other Topics Concern  . Not on file   Social History Narrative  . No narrative on file    Objective:  BP 140/70  Pulse 58  Temp 99 F (37.2 C) (Oral)  Ht 5\' 4"  (1.626 m)  Wt 214 lb (97.07 kg)  BMI 36.73 kg/m2  SpO2 98%  General appearance: alert, cooperative and appears stated age.  Appears to be in pain.  Ears: normal TM's and external ear canals both ears Throat:  lips, mucosa, and tongue normal; teeth and gums normal Neck: no adenopathy, no carotid bruit, supple, symmetrical, trachea midline and thyroid not enlarged, symmetric, no tenderness/mass/nodules Back: symmetric, no curvature. ROM normal. Diffuse tenderness along paraspinous muscles from cervical to lumbar region. No CVA tenderness. Lungs: clear to auscultation bilaterally Heart: regular rate and rhythm, S1, S2 normal, no murmur, click, rub or gallop Abdomen: soft, non-tender; bowel sounds normal; no masses,  no organomegaly Pulses: 2+ and symmetric. Bilateral temple tenderness, pulses palpable.  Skin: Skin color, texture, turgor normal. No rashes or lesions Lymph nodes: Cervical, supraclavicular, and axillary nodes normal.  Assessment and Plan:  Headache, temporal I have reviewed the ER records and her prior MRI cervical spine an d neurology notes. Given her history of cervical spinal stenosis , it is unclear whether her recent headache escalation is due to same or due to temporal arteritis as suspected by the ED physician.  ESR was elevated at 68.  Will order CTA head with contrast and refer ot [facility] for temporal artery biopsy. I have stopped her steroids after 3 days of use, since she has an alternative diagnosis and her diabetes is now out of control. Resume tizanidine and vicodin for pain control.   Bundle branch block left She has a LBBB on recent  EKG that was not present in February 2013 but was present on prior EKGS in 2011/2012 per review of [facility].  The question is whether the conduction delay is truly new and/or somehow impacted by her bladder interstim device. Will refer to [facility] for cardiac evaluation given recent episode of chest pain and multiple risk factors.    Updated Medication List Outpatient Encounter Prescriptions as of 01/07/2012  Medication Sig Dispense Refill  . cyanocobalamin (,VITAMIN B-12,) 1000 MCG/ML injection Inject 1 mL (1,000 mcg total) into the  muscle every 30 (thirty) days.  10 mL  3  . estradiol (ESTRACE) 0.5 MG tablet Take 1 tablet (0.5 mg total) by mouth daily.  30 tablet  3  . ferrous fumarate-iron polysaccharide complex (TANDEM) 162-115.2 MG CAPS Take 1 capsule by mouth daily with breakfast.  30 capsule  3  . hydrocodone-acetaminophen (LORCET-HD) 5-500 MG per capsule Take 1 capsule by mouth every 6 (six) hours as needed.        [name] imipramine (TOFRANIL) 50 MG tablet Take 50 mg by mouth at bedtime.        . Insulin Aspart Prot & Aspart (NOVOLOG MIX 70/30 FLEXPEN [place]) Inject into the skin as needed. Sliding scale      .  Loperamide HCl (IMODIUM PO) Take by mouth.        [name] omeprazole (PRILOSEC) 40 MG capsule Take 1 capsule (40 mg total) by mouth 2 (two) times daily.  60 capsule  3  . oxyCODONE-acetaminophen (PERCOCET) 5-325 MG per tablet Take 1 tablet by mouth every 6 (six) hours as needed.        . pregabalin (LYRICA) 50 MG capsule Take one by mouth up to 3 times a day.      [name] rOPINIRole (REQUIP) 1 MG tablet Take 1 tablet (1 mg total) by mouth at bedtime.  30 tablet  5  . sitaGLIPtin (JANUVIA) 100 MG tablet Take 100 mg by mouth daily.        [name] telmisartan-hydrochlorothiazide (MICARDIS HCT) 80-12.5 MG per tablet Take 1 tablet by mouth daily.        . traMADol (ULTRAM) 50 MG tablet Take 50 mg by mouth 4 (four) times daily.        [name] DISCONTD: nebivolol (BYSTOLIC) 10 MG tablet Take 10 mg by mouth daily.        . nebivolol (BYSTOLIC) 5 MG tablet Take 1 tablet (5 mg total) by mouth daily.  30 tablet  3  . DISCONTD: pravastatin (PRAVACHOL) 20 MG tablet Take 1 tablet (20 mg total) by mouth every evening.  90 tablet  3  . DISCONTD: valACYclovir (VALTREX) 1000 MG tablet Take 1 tablet (1,000 mg total) by mouth 3 (three) times daily.  21 tablet  1     Orders Placed This Encounter  Procedures  . Ambulatory referral to General Surgery    No Follow-up on file.

## 2012-01-10 ENCOUNTER — Ambulatory Visit: Payer: Self-pay | Admitting: Internal Medicine

## 2012-01-10 DIAGNOSIS — R930 Abnormal findings on diagnostic imaging of skull and head, not elsewhere classified: Secondary | ICD-10-CM | POA: Diagnosis not present

## 2012-01-10 DIAGNOSIS — R51 Headache: Secondary | ICD-10-CM | POA: Diagnosis not present

## 2012-01-11 ENCOUNTER — Telehealth: Payer: Self-pay | Admitting: *Deleted

## 2012-01-11 NOTE — Telephone Encounter (Signed)
See undelivered message below.  Dr. Jamal Collin is going to perform  small biopsy of the artery on her temple.

## 2012-01-11 NOTE — Telephone Encounter (Signed)
Because to confirm or rule out the diagnosis of temporal arteritis as the cause for her headaches that landed her in th ER recently, she has to have a biopsy of her temporal artery.

## 2012-01-11 NOTE — Telephone Encounter (Signed)
Patient notified

## 2012-01-11 NOTE — Telephone Encounter (Signed)
Patient called asking why she is being referred to Dr. Jamal Collin. I looked back through the notes, but I don't see anything. Please advise.

## 2012-01-14 DIAGNOSIS — R011 Cardiac murmur, unspecified: Secondary | ICD-10-CM | POA: Diagnosis not present

## 2012-01-14 DIAGNOSIS — R9431 Abnormal electrocardiogram [ECG] [EKG]: Secondary | ICD-10-CM | POA: Diagnosis not present

## 2012-01-15 ENCOUNTER — Emergency Department: Payer: Self-pay | Admitting: Emergency Medicine

## 2012-01-15 ENCOUNTER — Ambulatory Visit: Payer: Medicare Other | Admitting: Cardiovascular Disease

## 2012-01-15 DIAGNOSIS — H10219 Acute toxic conjunctivitis, unspecified eye: Secondary | ICD-10-CM | POA: Diagnosis not present

## 2012-01-15 DIAGNOSIS — H109 Unspecified conjunctivitis: Secondary | ICD-10-CM | POA: Diagnosis not present

## 2012-01-23 ENCOUNTER — Encounter: Payer: Self-pay | Admitting: Internal Medicine

## 2012-01-23 ENCOUNTER — Other Ambulatory Visit: Payer: Self-pay | Admitting: Internal Medicine

## 2012-01-23 DIAGNOSIS — R51 Headache: Secondary | ICD-10-CM | POA: Diagnosis not present

## 2012-01-23 MED ORDER — PREGABALIN 50 MG PO CAPS: 50.0000 mg | ORAL_CAPSULE | Freq: Three times a day (TID) | ORAL | Status: DC

## 2012-01-23 NOTE — Telephone Encounter (Signed)
Needing more samples of Lyrica 50 mg if you don't have any more call her in a prescription and let her know when it has been done.

## 2012-01-23 NOTE — Telephone Encounter (Signed)
Patient notified we have one bottle ready for pick up and I have called in an Rx for her as well.

## 2012-01-24 ENCOUNTER — Telehealth: Payer: Self-pay | Admitting: Internal Medicine

## 2012-01-24 DIAGNOSIS — M316 Other giant cell arteritis: Secondary | ICD-10-CM

## 2012-01-24 MED ORDER — PREDNISONE 20 MG PO TABS: 60.0000 mg | ORAL_TABLET | Freq: Every day | ORAL | Status: DC

## 2012-01-24 NOTE — Telephone Encounter (Signed)
  Talked with patient ( on my day off) .  She is rreassured that Dr. Jamal Collin knows what he is doing  (tempral artery biopsy) planned for Friday morning.  Patient has been having severe headaches and dizziness.  I will call in prednisone for her to start AFTER the biopsy while we wait for the results.

## 2012-01-24 NOTE — Telephone Encounter (Signed)
Dr. Jamal Collin is wanting to do a brain biopsy. Mrs. Denise Sharp is not happy with the way Dr. Jamal Collin is responding to her questions would like to speak with [name] today.

## 2012-01-25 DIAGNOSIS — R700 Elevated erythrocyte sedimentation rate: Secondary | ICD-10-CM | POA: Diagnosis not present

## 2012-01-25 DIAGNOSIS — R51 Headache: Secondary | ICD-10-CM | POA: Diagnosis not present

## 2012-01-29 ENCOUNTER — Telehealth: Payer: Self-pay | Admitting: Internal Medicine

## 2012-01-29 NOTE — Telephone Encounter (Signed)
Patient had a biopsy done on Friday and she was told that the results would be in either today or tomorrow please call her on her cell.

## 2012-01-29 NOTE — Telephone Encounter (Signed)
We have not received results on biopsy, will call patient once we receive them and [name] has reviewed them.

## 2012-01-30 ENCOUNTER — Telehealth: Payer: Self-pay | Admitting: Internal Medicine

## 2012-01-30 MED ORDER — PREDNISONE (PAK) 10 MG PO TABS: ORAL_TABLET | ORAL | Status: AC

## 2012-01-30 NOTE — Telephone Encounter (Signed)
[name], have you seen her biobsy results?

## 2012-01-30 NOTE — Telephone Encounter (Signed)
Her temporal artery biopsy was negative for vasculitis,  We will send copy to to Floyde Parkins, her neurologist because she needs to follow up with him. Denise Sharp Her headaches are probably coming her the Degenerative disks in her cervical spine. I would like to get her off the prednisone as soon as possible so I will send a 6 day  Prednisone taper to her pharmacy using the 10 mg tablets .

## 2012-01-30 NOTE — Telephone Encounter (Signed)
Home [phone]  Pt called to check on her biospy results that dr Jamal Collin done one Friday 7/12 and wanted to get those results.  Dr Jamal Collin stated that he was going to notify dr [name] yesterday, if you do not have this results this morning pt stated that we need to call dr sankar office to get results today.   Please advise pt of results.  Pt stated that her bp/ 155/100 blood sugar 175 pt stated she is laying down to relax.  Sent call to triage for bp and sugar

## 2012-01-30 NOTE — Telephone Encounter (Signed)
No, I have just checked again and nothing is available on the hospital website. Please call Dr. Angie Fava office to find out if they have received them yet.  Dr. Jamal Collin is with [facility] phone number [phone]

## 2012-01-30 NOTE — Telephone Encounter (Signed)
Caller: Nakshatra/Patient; PCP: Deborra Medina; CB#: [phone]; ; ; Call regarding Blood Sugar Is 175 and Bp Is 155/100 She is on Prednisone 60mg  daily.  Had a bx of temporal vascular area on Friday, 7/12 and is waiting on the results.  She was told to call for her bx results.   She has a severe h/a this morning but denies it being the worst h/a of her life.   Triaged per H/A protocal.  Needs to be seen in 4 hours but she declines appointment.  Wants to know about her test results.

## 2012-01-30 NOTE — Telephone Encounter (Signed)
Patient notified

## 2012-02-19 DIAGNOSIS — E119 Type 2 diabetes mellitus without complications: Secondary | ICD-10-CM | POA: Diagnosis not present

## 2012-02-19 DIAGNOSIS — H1030 Unspecified acute conjunctivitis, unspecified eye: Secondary | ICD-10-CM | POA: Diagnosis not present

## 2012-02-29 ENCOUNTER — Encounter: Payer: Self-pay | Admitting: Internal Medicine

## 2012-02-29 ENCOUNTER — Ambulatory Visit (INDEPENDENT_AMBULATORY_CARE_PROVIDER_SITE_OTHER): Payer: Medicare Other | Admitting: Internal Medicine

## 2012-02-29 VITALS — BP 126/76 | HR 77 | Temp 98.0°F | Resp 18 | Wt 223.5 lb

## 2012-02-29 DIAGNOSIS — Z9989 Dependence on other enabling machines and devices: Secondary | ICD-10-CM

## 2012-02-29 DIAGNOSIS — F5105 Insomnia due to other mental disorder: Secondary | ICD-10-CM

## 2012-02-29 DIAGNOSIS — F411 Generalized anxiety disorder: Secondary | ICD-10-CM | POA: Diagnosis not present

## 2012-02-29 DIAGNOSIS — E348 Other specified endocrine disorders: Secondary | ICD-10-CM | POA: Diagnosis not present

## 2012-02-29 DIAGNOSIS — G4733 Obstructive sleep apnea (adult) (pediatric): Secondary | ICD-10-CM

## 2012-02-29 DIAGNOSIS — F419 Anxiety disorder, unspecified: Secondary | ICD-10-CM | POA: Insufficient documentation

## 2012-02-29 DIAGNOSIS — F489 Nonpsychotic mental disorder, unspecified: Secondary | ICD-10-CM | POA: Diagnosis not present

## 2012-02-29 DIAGNOSIS — E278 Other specified disorders of adrenal gland: Secondary | ICD-10-CM

## 2012-02-29 DIAGNOSIS — I10 Essential (primary) hypertension: Secondary | ICD-10-CM

## 2012-02-29 DIAGNOSIS — I158 Other secondary hypertension: Secondary | ICD-10-CM | POA: Diagnosis not present

## 2012-02-29 DIAGNOSIS — I152 Hypertension secondary to endocrine disorders: Secondary | ICD-10-CM

## 2012-02-29 DIAGNOSIS — G4710 Hypersomnia, unspecified: Secondary | ICD-10-CM

## 2012-02-29 DIAGNOSIS — E279 Disorder of adrenal gland, unspecified: Secondary | ICD-10-CM

## 2012-02-29 DIAGNOSIS — R519 Headache, unspecified: Secondary | ICD-10-CM

## 2012-02-29 DIAGNOSIS — R51 Headache: Secondary | ICD-10-CM

## 2012-02-29 MED ORDER — DIAZEPAM 10 MG PO TABS: 10.0000 mg | ORAL_TABLET | Freq: Two times a day (BID) | ORAL | Status: DC | PRN

## 2012-02-29 NOTE — Patient Instructions (Addendum)
Stop the bystolic for a week.  Start the urine collection next Sunday  (pick up the jug now or next week) and return the urine on Monday.    Increase the valium to 10 at bedtime .  If your insomnia is not imporved with higher dose of valium, , you may try 2 or  3 mg on Lunesta

## 2012-02-29 NOTE — Progress Notes (Signed)
Patient ID: Denise Sharp, female   DOB: 12-25-1945, 66 y.o.   MRN: [id number]  Patient Active Problem List  Diagnosis  . Hypersomnolence  . Hypertension  . 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 Hearing loss, right  . Adrenal mass  . Irritable bowel syndrome  . Restless legs syndrome  . Degenerative disk disease  . Cervical stenosis of spinal canal  . Sciatica  . Headache, temporal  . Bundle branch block left  . Insomnia secondary to anxiety  . Esophageal stenosis    Subjective:  CC:   Chief Complaint  Patient presents with  . Hypertension  . Follow-up    HPI:   Denise Sharp a 66 y.o. female who presents Labile hypertension..  the last several weeks her blood pressures have been ranging from 180/100 to 80/40 .  Dr. Clayborn Bigness has changed her medication from micardis hct to losartan 50 mg daily, but she continues to have episodes of hypotension if she takes losartan daily.  Her blood pressure has been labile for the past year but has been more so over the last several weeks. She did undergo placement of an InterStim in the S3 foramen by  Dr. cope in April for fecal and urinary incontinence.   She does have a history of bilateral adrenal nodules. This was previously worked up for pheochromocytoma by Dr. Jacqlyn Larsen. She has not had her urine checked for 24 hr excretion of catecholamines in over a year but has had a serologic test recently by Dr. Jacqlyn Larsen. 2) insomnia :.  This is a chronic problem. She has had multiple trials of sedatives. Her last set successful trial was with Johnnye Sima, but her insurance would not pay for it.    Past Medical History  Diagnosis Date  . Diabetes mellitus   . Depression   . Hypertension   . Cervical spinal stenosis 1994    due to trauma to back ([employer] accident), has intermittent paralysis and parasthesias  . Adrenal mass   . IBS (irritable bowel syndrome)   . Obstructive sleep apnea   . Anemia    iron deficient post  2 unit txfsn 2009, normal endo/colonoscopy by [facility]  . Esophageal stenosis March 2011    with transietn outlet obstruction by food, cleared by EGD     Past Surgical History  Procedure Date  . Gastric bypass   . Abdominal hysterectomy   . Gastric bypass 2000, 2005    Dr. Debroah Loop  . Appendectomy   . Gallbladder surgery 1976  . Rotator cuff repair     right  . Joint replacement 2007    bilateral knee. [name],  [name]  . Spine surgery 1995    [place]  . Interstim implant placement April 2013    [facility]         The following portions of the patient's history were reviewed and updated as appropriate: Allergies, current medications, and problem list.    Review of Systems:   A comprehensive ROS was done and positive for headaches, fatigue, insomnia  and improved incontinence.   The rest was negative.   History   Social History  . Marital Status: Married    Spouse Name: N/A    Number of Children: N/A  . Years of Education: N/A   Occupational History  . Not on file.   Social History Main Topics  . Smoking status: Never Smoker   . Smokeless tobacco: Never Used  . Alcohol Use: No  .  Drug Use: No  . Sexually Active: Not on file   Other Topics Concern  . Not on file   Social History Narrative  . No narrative on file    Objective:  BP 126/76  Pulse 77  Temp 98 F (36.7 C) (Oral)  Resp 18  Wt 223 lb 8 oz (101.379 kg)  SpO2 99%  General appearance: alert, cooperative and appears stated age Ears: normal TM's and external ear canals both ears Throat: lips, mucosa, and tongue normal; teeth and gums normal Neck: no adenopathy, no carotid bruit, supple, symmetrical, trachea midline and thyroid not enlarged, symmetric, no tenderness/mass/nodules Back: symmetric, no curvature. ROM normal. No CVA tenderness. Lungs: clear to auscultation bilaterally Heart: regular rate and rhythm, S1, S2 normal, no murmur, click, rub or gallop Abdomen: soft,  non-tender; bowel sounds normal; no masses,  no organomegaly Pulses: 2+ and symmetric Skin: Skin color, texture, turgor normal. No rashes or lesions Lymph nodes: Cervical, supraclavicular, and axillary nodes normal.  Assessment and Plan:  Adrenal mass Given her worsening labile hypertension I have ordered a 24-hour urine collection for catecholamine excretion. However she is on a beta blocker which will have to be held. I have changed her medications and she will start the urine collection in one week after being off of the by systolic.  Hypertension Labile, currently managed with losartan and diastolic. I have held the by systolic given the need to collect her urine for 24-hour excretion catecholamines. She will use amlodipine if needed for elevated blood pressure.  Headache, temporal She underwent a temporal artery biopsy to rule out vasculitis. The biopsy was negative. Her headaches appear to be due to her severe cervical spine disease.  Insomnia secondary to anxiety She is currently taking 5 mg of Valium at bedtime. I've authorized her to increase this to 10 mg to see if his helps her insomnia. If it does not she will reduce the dose to 5 mg and try to milligrams of Lunesta. Samples given  Hypersomnolence Improved with treatment of obstructive sleep apnea. Her patient is now having insomnia and is needing adjustment of her medications.  OSA on CPAP Patient reports compliance with use of CPAP. However she is having difficulty falling asleep. Will increase Dilantin 10 mg at bedtime as a trial.   Updated Medication List Outpatient Encounter Prescriptions as of 02/29/2012  Medication Sig Dispense Refill  . cyanocobalamin (,VITAMIN B-12,) 1000 MCG/ML injection Inject 1 mL (1,000 mcg total) into the muscle every 30 (thirty) days.  10 mL  3  . estradiol (ESTRACE) 0.5 MG tablet Take 1 tablet (0.5 mg total) by mouth daily.  30 tablet  3  . hydrocodone-acetaminophen (LORCET-HD) 5-500 MG per  capsule Take 1 capsule by mouth every 6 (six) hours as needed.        . Insulin Aspart Prot & Aspart (NOVOLOG MIX 70/30 FLEXPEN [place]) Inject into the skin as needed. Sliding scale      . Loperamide HCl (IMODIUM PO) Take by mouth.        . losartan (COZAAR) 50 MG tablet Take 50 mg by mouth daily.      [name] omeprazole (PRILOSEC) 40 MG capsule Take 1 capsule (40 mg total) by mouth 2 (two) times daily.  60 capsule  3  . oxyCODONE-acetaminophen (PERCOCET) 5-325 MG per tablet Take 1 tablet by mouth every 6 (six) hours as needed.        . pregabalin (LYRICA) 50 MG capsule Take 1 capsule (50 mg total) by  mouth 3 (three) times daily.  90 capsule  3  . rOPINIRole (REQUIP) 1 MG tablet Take 1 tablet (1 mg total) by mouth at bedtime.  30 tablet  5  . sitaGLIPtin (JANUVIA) 100 MG tablet Take 100 mg by mouth daily.        . traMADol (ULTRAM) 50 MG tablet Take 50 mg by mouth 4 (four) times daily.        [name] DISCONTD: diazepam (VALIUM) 5 MG tablet Take 5 mg by mouth every 6 (six) hours as needed.      [name] DISCONTD: nebivolol (BYSTOLIC) 5 MG tablet Take 1 tablet (5 mg total) by mouth daily.  30 tablet  3  . DISCONTD: nebivolol (BYSTOLIC) 5 MG tablet Take 10 mg by mouth daily.      . diazepam (VALIUM) 10 MG tablet Take 1 tablet (10 mg total) by mouth every 12 (twelve) hours as needed for anxiety.  30 tablet  1  . eszopiclone (LUNESTA) 2 MG TABS Take 1 tablet (2 mg total) by mouth at bedtime. Take immediately before bedtime  14 tablet  0  . ferrous fumarate-iron polysaccharide complex (TANDEM) 162-115.2 MG CAPS Take 1 capsule by mouth daily with breakfast.  30 capsule  3  . DISCONTD: imipramine (TOFRANIL) 50 MG tablet Take 50 mg by mouth at bedtime.        [name] DISCONTD: telmisartan-hydrochlorothiazide (MICARDIS HCT) 80-12.5 MG per tablet Take 1 tablet by mouth daily.

## 2012-03-02 ENCOUNTER — Encounter: Payer: Self-pay | Admitting: Internal Medicine

## 2012-03-02 DIAGNOSIS — K222 Esophageal obstruction: Secondary | ICD-10-CM | POA: Insufficient documentation

## 2012-03-02 DIAGNOSIS — G4733 Obstructive sleep apnea (adult) (pediatric): Secondary | ICD-10-CM | POA: Insufficient documentation

## 2012-03-02 DIAGNOSIS — Z9989 Dependence on other enabling machines and devices: Secondary | ICD-10-CM | POA: Insufficient documentation

## 2012-03-02 MED ORDER — ESZOPICLONE 2 MG PO TABS: 2.0000 mg | ORAL_TABLET | Freq: Every day | ORAL | Status: DC

## 2012-03-02 NOTE — Assessment & Plan Note (Signed)
She is currently taking 5 mg of Valium at bedtime. I've authorized her to increase this to 10 mg to see if his helps her insomnia. If it does not she will reduce the dose to 5 mg and try to milligrams of Lunesta. Samples given

## 2012-03-02 NOTE — Assessment & Plan Note (Signed)
Labile, currently managed with losartan and diastolic. I have held the by systolic given the need to collect her urine for 24-hour excretion catecholamines. She will use amlodipine if needed for elevated blood pressure.

## 2012-03-02 NOTE — Assessment & Plan Note (Signed)
Patient reports compliance with use of CPAP. However she is having difficulty falling asleep. Will increase Dilantin 10 mg at bedtime as a trial.

## 2012-03-02 NOTE — Assessment & Plan Note (Signed)
She underwent a temporal artery biopsy to rule out vasculitis. The biopsy was negative. Her headaches appear to be due to her severe cervical spine disease.

## 2012-03-02 NOTE — Assessment & Plan Note (Signed)
Improved with treatment of obstructive sleep apnea. Her patient is now having insomnia and is needing adjustment of her medications.

## 2012-03-02 NOTE — Assessment & Plan Note (Addendum)
Given her worsening labile hypertension I have ordered a 24-hour urine collection for catecholamine excretion. However she is on a beta blocker which will have to be held. I have changed her medications and she will start the urine collection in one week after being off of the by systolic.

## 2012-03-03 ENCOUNTER — Encounter: Payer: Self-pay | Admitting: Internal Medicine

## 2012-03-03 ENCOUNTER — Other Ambulatory Visit: Payer: Self-pay | Admitting: Internal Medicine

## 2012-03-03 DIAGNOSIS — F419 Anxiety disorder, unspecified: Secondary | ICD-10-CM

## 2012-03-03 DIAGNOSIS — F5105 Insomnia due to other mental disorder: Secondary | ICD-10-CM

## 2012-03-03 DIAGNOSIS — D649 Anemia, unspecified: Secondary | ICD-10-CM

## 2012-03-03 DIAGNOSIS — G4733 Obstructive sleep apnea (adult) (pediatric): Secondary | ICD-10-CM | POA: Insufficient documentation

## 2012-03-03 MED ORDER — ROPINIROLE HCL 1 MG PO TABS: 1.0000 mg | ORAL_TABLET | Freq: Every day | ORAL | Status: DC

## 2012-03-03 MED ORDER — OMEPRAZOLE 40 MG PO CPDR: 40.0000 mg | DELAYED_RELEASE_CAPSULE | Freq: Two times a day (BID) | ORAL | Status: DC

## 2012-03-03 MED ORDER — LOSARTAN POTASSIUM 50 MG PO TABS: 50.0000 mg | ORAL_TABLET | Freq: Every day | ORAL | Status: DC

## 2012-03-03 MED ORDER — CYANOCOBALAMIN 1000 MCG/ML IJ SOLN: 1000.0000 ug | INTRAMUSCULAR | Status: DC

## 2012-03-03 MED ORDER — FERROUS FUM-IRON POLYSACCH 162-115.2 MG PO CAPS: 1.0000 | ORAL_CAPSULE | Freq: Every day | ORAL | Status: DC

## 2012-03-03 MED ORDER — TRAMADOL HCL 50 MG PO TABS: 50.0000 mg | ORAL_TABLET | Freq: Four times a day (QID) | ORAL | Status: DC

## 2012-03-03 MED ORDER — ESZOPICLONE 2 MG PO TABS: 2.0000 mg | ORAL_TABLET | Freq: Every day | ORAL | Status: DC

## 2012-03-03 MED ORDER — SITAGLIPTIN PHOSPHATE 100 MG PO TABS: 100.0000 mg | ORAL_TABLET | Freq: Every day | ORAL | Status: DC

## 2012-03-03 MED ORDER — HYDROCODONE-ACETAMINOPHEN 5-500 MG PO CAPS: 1.0000 | ORAL_CAPSULE | Freq: Four times a day (QID) | ORAL | Status: DC | PRN

## 2012-03-03 MED ORDER — ESTRADIOL 0.5 MG PO TABS: 0.5000 mg | ORAL_TABLET | Freq: Every day | ORAL | Status: DC

## 2012-03-03 MED ORDER — PREGABALIN 50 MG PO CAPS: 50.0000 mg | ORAL_CAPSULE | Freq: Three times a day (TID) | ORAL | Status: DC

## 2012-03-03 MED ORDER — DIAZEPAM 10 MG PO TABS: 10.0000 mg | ORAL_TABLET | Freq: Two times a day (BID) | ORAL | Status: DC | PRN

## 2012-03-03 MED ORDER — DIAZEPAM 10 MG PO TABS: 10.0000 mg | ORAL_TABLET | Freq: Two times a day (BID) | ORAL | Status: AC | PRN

## 2012-03-04 DIAGNOSIS — B351 Tinea unguium: Secondary | ICD-10-CM | POA: Diagnosis not present

## 2012-03-04 DIAGNOSIS — M79609 Pain in unspecified limb: Secondary | ICD-10-CM | POA: Diagnosis not present

## 2012-03-10 ENCOUNTER — Other Ambulatory Visit: Payer: Medicare Other

## 2012-03-10 DIAGNOSIS — E348 Other specified endocrine disorders: Secondary | ICD-10-CM | POA: Diagnosis not present

## 2012-03-10 DIAGNOSIS — I152 Hypertension secondary to endocrine disorders: Secondary | ICD-10-CM

## 2012-03-10 DIAGNOSIS — I158 Other secondary hypertension: Secondary | ICD-10-CM | POA: Diagnosis not present

## 2012-03-10 NOTE — Addendum Note (Signed)
Addended by: Crissie Reese on: 03/10/2012 12:29 PM   Modules accepted: Orders

## 2012-03-14 ENCOUNTER — Encounter: Payer: Self-pay | Admitting: Cardiovascular Disease

## 2012-03-14 ENCOUNTER — Ambulatory Visit (INDEPENDENT_AMBULATORY_CARE_PROVIDER_SITE_OTHER): Payer: Medicare Other | Admitting: Cardiovascular Disease

## 2012-03-14 ENCOUNTER — Telehealth: Payer: Self-pay

## 2012-03-14 VITALS — BP 122/70 | HR 92 | Ht 64.5 in | Wt 225.5 lb

## 2012-03-14 DIAGNOSIS — R079 Chest pain, unspecified: Secondary | ICD-10-CM | POA: Diagnosis not present

## 2012-03-14 DIAGNOSIS — R0989 Other specified symptoms and signs involving the circulatory and respiratory systems: Secondary | ICD-10-CM

## 2012-03-14 DIAGNOSIS — I447 Left bundle-branch block, unspecified: Secondary | ICD-10-CM

## 2012-03-14 DIAGNOSIS — R0609 Other forms of dyspnea: Secondary | ICD-10-CM | POA: Diagnosis not present

## 2012-03-14 DIAGNOSIS — I10 Essential (primary) hypertension: Secondary | ICD-10-CM

## 2012-03-14 DIAGNOSIS — R0600 Dyspnea, unspecified: Secondary | ICD-10-CM

## 2012-03-14 NOTE — Patient Instructions (Addendum)
Labs today.   Your physician has requested that you have an echocardiogram. Echocardiography is a painless test that uses sound waves to create images of your heart. It provides your doctor with information about the size and shape of your heart and how well your heart's chambers and valves are working. This procedure takes approximately one hour. There are no restrictions for this procedure.  Follow up after tests

## 2012-03-14 NOTE — Progress Notes (Signed)
HPI  This is a 66 year old female who was referred by 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 The patient had this problem for many years and was following with Dr. Clayborn Bigness regarding this. It seems that her blood pressure fluctuates significantly even the same day from reportedly more than 99991111 systolic to sometimes less than 123XX123 systolic. When her blood pressure is elevated, she complains of shortness of breath and occasional chest pain. When the blood pressure is low, she feels tired and dizzy. 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 She is currently on losartan 50 mg once daily and uses Bystolic as needed for elevated blood pressure. She recently had a 24-hour urine collection for evaluation of pheochromocytoma. She underwent multiple cardiac workup in the past including stress testing and was told that it was normal.  Allergies  Allergen Reactions  . Lasix (Furosemide) Swelling  . Tetracyclines & Related Hives  . Sulfa Drugs Cross Reactors Rash     Current Outpatient Prescriptions on File Prior to Visit  Medication Sig Dispense Refill  . diazepam (VALIUM) 10 MG tablet Take 1 tablet (10 mg total) by mouth every 12 (twelve) hours as needed for anxiety.  90 tablet  3  . estradiol (ESTRACE) 0.5 MG tablet Take 1 tablet (0.5 mg total) by mouth daily.  90 tablet  3  . eszopiclone (LUNESTA) 2 MG TABS Take 1 tablet (2 mg total) by mouth at bedtime. Take immediately before bedtime  90 tablet  3  . ferrous fumarate-iron polysaccharide complex (TANDEM) 162-115.2 MG CAPS Take 1 capsule by mouth daily with breakfast.  90 capsule  3  . hydrocodone-acetaminophen (LORCET-HD) 5-500 MG per capsule Take 1 capsule by mouth every 6 (six) hours as needed.  120 capsule  3  . Insulin Aspart Prot & Aspart (NOVOLOG MIX 70/30 FLEXPEN [place]) Inject into the skin as needed. Sliding scale      . Loperamide HCl (IMODIUM PO) Take by mouth.        . losartan (COZAAR)  50 MG tablet Take 1 tablet (50 mg total) by mouth daily.  90 tablet  3  . oxyCODONE-acetaminophen (PERCOCET) 5-325 MG per tablet Take 1 tablet by mouth every 6 (six) hours as needed.        . pregabalin (LYRICA) 50 MG capsule Take 1 capsule (50 mg total) by mouth 3 (three) times daily.  270 capsule  3  . rOPINIRole (REQUIP) 1 MG tablet Take 1 tablet (1 mg total) by mouth at bedtime.  90 tablet  5  . sitaGLIPtin (JANUVIA) 100 MG tablet Take 1 tablet (100 mg total) by mouth daily.  90 tablet  3  . traMADol (ULTRAM) 50 MG tablet Take 1 tablet (50 mg total) by mouth 4 (four) times daily.  360 tablet  3     Past Medical History  Diagnosis Date  . Diabetes mellitus   . Depression   . Hypertension   . Cervical spinal stenosis 1994    due to trauma to back ([employer] accident), has intermittent paralysis and parasthesias  . Adrenal mass   . IBS (irritable bowel syndrome)   . Obstructive sleep apnea     using CPAP  . Anemia     iron deficient post  2 unit txfsn 2009, normal endo/colonoscopy by [facility]  . Esophageal stenosis March 2011    with transietn outlet obstruction by food, cleared by EGD   . LBBB (left  bundle branch block)      Past Surgical History  Procedure Date  . Gastric bypass   . Abdominal hysterectomy   . Gastric bypass 2000, 2005    Dr. Debroah Loop  . Appendectomy   . Gallbladder surgery 1976  . Rotator cuff repair     right  . Joint replacement 2007    bilateral knee. [name],  [name]  . Spine surgery 1995    [place]  . Interstim implant placement April 2013    [place]     Family History  Problem Relation Age of Onset  . Heart disease Father   . Hypertension Father      History   Social History  . Marital Status: Married    Spouse Name: N/A    Number of Children: N/A  . Years of Education: N/A   Occupational History  . Not on file.   Social History Main Topics  . Smoking status: Never Smoker   . Smokeless tobacco: Never Used  . Alcohol Use: No  . Drug  Use: No  . Sexually Active: Not on file   Other Topics Concern  . Not on file   Social History Narrative  . No narrative on file     ROS Constitutional: Negative for fever, chills, diaphoresis, activity change, appetite change . HENT: Negative for hearing loss, nosebleeds, congestion, sore throat, facial swelling, drooling, trouble swallowing, neck pain, voice change, sinus pressure and tinnitus.  Eyes: Negative for photophobia, pain, discharge and visual disturbance.  Respiratory: Negative for apnea, cough and wheezing.  Cardiovascular: Negative for leg swelling.  Gastrointestinal: Negative for nausea, vomiting, abdominal pain, diarrhea, constipation, blood in stool and abdominal distention.  Genitourinary: Negative for dysuria, urgency, frequency, hematuria and decreased urine volume.  Musculoskeletal: Negative for myalgias, back pain, joint swelling, arthralgias and gait problem.  Skin: Negative for color change, pallor, rash and wound.  Neurological: Negative for dizziness, tremors, seizures, syncope, speech difficulty, weakness, light-headedness, numbness and headaches.  Psychiatric/Behavioral: Negative for suicidal ideas, hallucinations, behavioral problems and agitation. The patient is not nervous/anxious.     PHYSICAL EXAM   BP 122/70  Pulse 92  Ht 5' 4.5" (1.638 m)  Wt 225 lb 8 oz (102.286 kg)  BMI 38.11 kg/m2  Constitutional: She is oriented to person, place, and time. She appears well-developed and well-nourished. No distress.  HENT: No nasal discharge.  Head: Normocephalic and atraumatic.  Eyes: Pupils are equal and round. Right eye exhibits no discharge. Left eye exhibits no discharge.  Neck: Normal range of motion. Neck supple. No JVD present. No thyromegaly present.  Cardiovascular: Normal rate, regular rhythm, normal heart sounds. Exam reveals no gallop and no friction rub. No murmur heard.  Pulmonary/Chest: Effort normal and breath sounds normal. No stridor.  No respiratory distress. She has no wheezes. She has no rales. She exhibits no tenderness.  Abdominal: Soft. Bowel sounds are normal. She exhibits no distension. There is no tenderness. There is no rebound and no guarding.  Musculoskeletal: Normal range of motion. She exhibits no edema and no tenderness.  Neurological: She is alert and oriented to person, place, and time. Coordination normal.  Skin: Skin is warm and dry. No rash noted. She is not diaphoretic. No erythema. No pallor.  Psychiatric: She has a normal mood and affect. Her behavior is normal. Judgment and thought content normal.    EKG: Sinus  Rhythm  -Left bundle branch block.   ABNORMAL    ASSESSMENT AND PLAN

## 2012-03-14 NOTE — Assessment & Plan Note (Signed)
The patient has labile hypertension of unclear etiology. She is known to have an adrenal mass. I agree with workup for pheochromocytoma. . I will also check cortisol level and Aldosterone/Renin ratio. 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 He states he is unusually difficult to treat but I agree with using as needed medication in addition to her baseline treatment.

## 2012-03-14 NOTE — Assessment & Plan Note (Signed)
She seems to have prolonged history of left bundle branch block with associated dyspnea. Thus, I will obtain an echocardiogram.

## 2012-03-14 NOTE — Telephone Encounter (Signed)
LMOM re: ok to have labs drawn at her convenience per Dr. Fletcher Anon

## 2012-03-14 NOTE — Telephone Encounter (Signed)
Lab called to say pt is there for aldosterone level/cortisol level to be drawn.  Lab tach says this test requires pt to be off antihypertensives/potassium, etc for a certain amt of time. She says there are many restrictions/limitations to this test and asks what I want pt to do.  I spoke with pt over phone and explained I would have lab tech fax me document that lists restrictions and I will discuss with Dr. Fletcher Anon today and call pt back at [phone].  Understanding verb.

## 2012-03-14 NOTE — Telephone Encounter (Signed)
Discussed with Dr. Fletcher Anon who says ok to get these labs anytime. The only consideration is if pt taking spironolactone. She is not taking this.  I will call pt to tell her to have labs checked at her convenience without having to hold any meds.

## 2012-03-16 LAB — CATECHOLAMINES, FRACTIONATED, URINE, 24 HOUR
Calculated Total (E+NE): 46 mcg/24 h (ref 26–121)
Creatinine, Urine mg/day-CATEUR: 1.36 g/(24.h) (ref 0.63–2.50)
Dopamine, 24 hr Urine: 326 mcg/24 h (ref 52–480)
Norepinephrine, 24 hr Ur: 46 mcg/24 h (ref 15–100)
Total Volume - CF 24Hr U: 1700 mL

## 2012-03-18 DIAGNOSIS — L02619 Cutaneous abscess of unspecified foot: Secondary | ICD-10-CM | POA: Diagnosis not present

## 2012-03-18 DIAGNOSIS — L03039 Cellulitis of unspecified toe: Secondary | ICD-10-CM | POA: Diagnosis not present

## 2012-03-18 DIAGNOSIS — R0989 Other specified symptoms and signs involving the circulatory and respiratory systems: Secondary | ICD-10-CM | POA: Diagnosis not present

## 2012-03-18 DIAGNOSIS — I10 Essential (primary) hypertension: Secondary | ICD-10-CM | POA: Diagnosis not present

## 2012-03-18 DIAGNOSIS — L923 Foreign body granuloma of the skin and subcutaneous tissue: Secondary | ICD-10-CM | POA: Diagnosis not present

## 2012-03-18 DIAGNOSIS — R079 Chest pain, unspecified: Secondary | ICD-10-CM | POA: Diagnosis not present

## 2012-03-18 DIAGNOSIS — R0609 Other forms of dyspnea: Secondary | ICD-10-CM | POA: Diagnosis not present

## 2012-03-25 ENCOUNTER — Other Ambulatory Visit: Payer: Self-pay

## 2012-03-25 ENCOUNTER — Other Ambulatory Visit (INDEPENDENT_AMBULATORY_CARE_PROVIDER_SITE_OTHER): Payer: Medicare Other

## 2012-03-25 DIAGNOSIS — R0989 Other specified symptoms and signs involving the circulatory and respiratory systems: Secondary | ICD-10-CM

## 2012-03-25 DIAGNOSIS — R0609 Other forms of dyspnea: Secondary | ICD-10-CM

## 2012-03-25 DIAGNOSIS — R0600 Dyspnea, unspecified: Secondary | ICD-10-CM

## 2012-03-25 DIAGNOSIS — IMO0001 Reserved for inherently not codable concepts without codable children: Secondary | ICD-10-CM | POA: Diagnosis not present

## 2012-03-25 DIAGNOSIS — E11329 Type 2 diabetes mellitus with mild nonproliferative diabetic retinopathy without macular edema: Secondary | ICD-10-CM | POA: Diagnosis not present

## 2012-03-27 ENCOUNTER — Encounter: Payer: Self-pay | Admitting: Cardiovascular Disease

## 2012-03-27 ENCOUNTER — Ambulatory Visit (INDEPENDENT_AMBULATORY_CARE_PROVIDER_SITE_OTHER): Payer: Medicare Other | Admitting: Cardiovascular Disease

## 2012-03-27 VITALS — BP 120/70 | HR 88 | Ht 64.0 in | Wt 225.0 lb

## 2012-03-27 DIAGNOSIS — I10 Essential (primary) hypertension: Secondary | ICD-10-CM | POA: Diagnosis not present

## 2012-03-27 DIAGNOSIS — I447 Left bundle-branch block, unspecified: Secondary | ICD-10-CM

## 2012-03-27 NOTE — Patient Instructions (Addendum)
Your echocardiogram was fine.  Labs were good.  Follow up as needed.

## 2012-03-27 NOTE — Assessment & Plan Note (Signed)
Her blood pressure has been within the normal range during her 2 visits here. 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 Workup for pheochromocytoma was negative. I also did a cortisol level which came back normal. Aldosterone to renin ratio was normal as well. I suspect that there is a component of anxiety and stress contributing to her labile hypertension.

## 2012-03-27 NOTE — Assessment & Plan Note (Signed)
She had an echocardiogram done which showed normal LV systolic function without significant valvular abnormalities. She can followup with [place] as needed.

## 2012-03-27 NOTE — Progress Notes (Signed)
HPI  This is a 66 year old female who is here today for followup visit regarding labile hypertension. The patient had this problem for many years and was following with Dr. Clayborn Bigness regarding this. It seems that her blood pressure fluctuates significantly even the same day from reportedly more than 99991111 systolic to sometimes less than 123XX123 systolic. When her blood pressure is elevated, she complains of shortness of breath and occasional chest pain. When the blood pressure is low, she feels tired and dizzy. 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 She is currently on losartan 50 mg once daily and uses Bystolic as needed for elevated blood pressure. She recently had a 24-hour urine collection for evaluation of pheochromocytoma which was normal. She underwent multiple cardiac workup in the past including stress testing and was told that it was normal.   Allergies  Allergen Reactions  . Lasix (Furosemide) Swelling  . Tetracyclines & Related Hives  . Sulfa Drugs Cross Reactors Rash     Current Outpatient Prescriptions on File Prior to Visit  Medication Sig Dispense Refill  . cyanocobalamin (,VITAMIN B-12,) 1000 MCG/ML injection Takes 2 injections twice a month.      . diazepam (VALIUM) 10 MG tablet Take 1 tablet (10 mg total) by mouth every 12 (twelve) hours as needed for anxiety.  90 tablet  3  . estradiol (ESTRACE) 0.5 MG tablet Take 1 tablet (0.5 mg total) by mouth daily.  90 tablet  3  . eszopiclone (LUNESTA) 2 MG TABS Take 1 tablet (2 mg total) by mouth at bedtime. Take immediately before bedtime  90 tablet  3  . hydrocodone-acetaminophen (LORCET-HD) 5-500 MG per capsule Take 1 capsule by mouth every 6 (six) hours as needed.  120 capsule  3  . Insulin Aspart Prot & Aspart (NOVOLOG MIX 70/30 FLEXPEN [employer]) Inject into the skin as needed. Sliding scale      . Loperamide HCl (IMODIUM PO) Take by mouth.        . losartan (COZAAR) 50 MG tablet Take 1  tablet (50 mg total) by mouth daily.  90 tablet  3  . oxyCODONE-acetaminophen (PERCOCET) 5-325 MG per tablet Take 1 tablet by mouth every 6 (six) hours as needed.        . pregabalin (LYRICA) 50 MG capsule Take 1 capsule (50 mg total) by mouth 3 (three) times daily.  270 capsule  3  . rOPINIRole (REQUIP) 1 MG tablet Take 1 tablet (1 mg total) by mouth at bedtime.  90 tablet  5  . sitaGLIPtin (JANUVIA) 100 MG tablet Take 1 tablet (100 mg total) by mouth daily.  90 tablet  3  . traMADol (ULTRAM) 50 MG tablet Take 1 tablet (50 mg total) by mouth 4 (four) times daily.  360 tablet  3     Past Medical History  Diagnosis Date  . Diabetes mellitus   . Depression   . Hypertension   . Cervical spinal stenosis 1994    due to trauma to back ([employer] accident), has intermittent paralysis and parasthesias  . Adrenal mass   . IBS (irritable bowel syndrome)   . Obstructive sleep apnea     using CPAP  . Anemia     iron deficient post  2 unit txfsn 2009, normal endo/colonoscopy by [facility]  . Esophageal stenosis March 2011    with transietn outlet obstruction by food, cleared by EGD   . LBBB (left bundle branch  block)      Past Surgical History  Procedure Date  . Gastric bypass   . Abdominal hysterectomy   . Gastric bypass 2000, 2005    Dr. Debroah Loop  . Appendectomy   . Gallbladder surgery 1976  . Rotator cuff repair     right  . Joint replacement 2007    bilateral knee. [name],  [name]  . Spine surgery 1995    [place]  . Interstim implant placement April 2013    [place]     Family History  Problem Relation Age of Onset  . Heart disease Father   . Hypertension Father      History   Social History  . Marital Status: Married    Spouse Name: N/A    Number of Children: N/A  . Years of Education: N/A   Occupational History  . Not on file.   Social History Main Topics  . Smoking status: Never Smoker   . Smokeless tobacco: Never Used  . Alcohol Use: No  . Drug Use: No  . Sexually  Active: Not on file   Other Topics Concern  . Not on file   Social History Narrative  . No narrative on file     PHYSICAL EXAM   BP 120/70  Pulse 88  Ht 5\' 4"  (1.626 m)  Wt 225 lb (102.059 kg)  BMI 38.62 kg/m2  Constitutional: She is oriented to person, place, and time. She appears well-developed and well-nourished. No distress.  HENT: No nasal discharge.  Head: Normocephalic and atraumatic.  Eyes: Pupils are equal and round. Right eye exhibits no discharge. Left eye exhibits no discharge.  Neck: Normal range of motion. Neck supple. No JVD present. No thyromegaly present.  Cardiovascular: Normal rate, regular rhythm, normal heart sounds. Exam reveals no gallop and no friction rub. No murmur heard.  Pulmonary/Chest: Effort normal and breath sounds normal. No stridor. No respiratory distress. She has no wheezes. She has no rales. She exhibits no tenderness.  Abdominal: Soft. Bowel sounds are normal. She exhibits no distension. There is no tenderness. There is no rebound and no guarding.  Musculoskeletal: Normal range of motion. She exhibits no edema and no tenderness.  Neurological: She is alert and oriented to person, place, and time. Coordination normal.  Skin: Skin is warm and dry. No rash noted. She is not diaphoretic. No erythema. No pallor.  Psychiatric: She has a normal mood and affect. Her behavior is normal. Judgment and thought content normal.     ASSESSMENT AND PLAN

## 2012-04-08 DIAGNOSIS — H903 Sensorineural hearing loss, bilateral: Secondary | ICD-10-CM | POA: Diagnosis not present

## 2012-04-08 DIAGNOSIS — R42 Dizziness and giddiness: Secondary | ICD-10-CM | POA: Diagnosis not present

## 2012-04-08 DIAGNOSIS — H60509 Unspecified acute noninfective otitis externa, unspecified ear: Secondary | ICD-10-CM | POA: Diagnosis not present

## 2012-04-08 DIAGNOSIS — H6120 Impacted cerumen, unspecified ear: Secondary | ICD-10-CM | POA: Diagnosis not present

## 2012-04-16 DIAGNOSIS — R42 Dizziness and giddiness: Secondary | ICD-10-CM | POA: Diagnosis not present

## 2012-04-21 DIAGNOSIS — R070 Pain in throat: Secondary | ICD-10-CM | POA: Diagnosis not present

## 2012-04-21 DIAGNOSIS — R42 Dizziness and giddiness: Secondary | ICD-10-CM | POA: Diagnosis not present

## 2012-04-21 DIAGNOSIS — H903 Sensorineural hearing loss, bilateral: Secondary | ICD-10-CM | POA: Diagnosis not present

## 2012-04-21 DIAGNOSIS — H9209 Otalgia, unspecified ear: Secondary | ICD-10-CM | POA: Diagnosis not present

## 2012-05-07 DIAGNOSIS — S43429A Sprain of unspecified rotator cuff capsule, initial encounter: Secondary | ICD-10-CM | POA: Diagnosis not present

## 2012-05-13 ENCOUNTER — Encounter: Payer: Self-pay | Admitting: Unknown Physician Specialty

## 2012-05-13 DIAGNOSIS — R269 Unspecified abnormalities of gait and mobility: Secondary | ICD-10-CM | POA: Diagnosis not present

## 2012-05-13 DIAGNOSIS — R42 Dizziness and giddiness: Secondary | ICD-10-CM | POA: Diagnosis not present

## 2012-05-16 ENCOUNTER — Encounter: Payer: Self-pay | Admitting: Unknown Physician Specialty

## 2012-05-16 DIAGNOSIS — R262 Difficulty in walking, not elsewhere classified: Secondary | ICD-10-CM | POA: Diagnosis not present

## 2012-05-16 DIAGNOSIS — IMO0001 Reserved for inherently not codable concepts without codable children: Secondary | ICD-10-CM | POA: Diagnosis not present

## 2012-05-16 DIAGNOSIS — R269 Unspecified abnormalities of gait and mobility: Secondary | ICD-10-CM | POA: Diagnosis not present

## 2012-05-16 DIAGNOSIS — R42 Dizziness and giddiness: Secondary | ICD-10-CM | POA: Diagnosis not present

## 2012-06-04 DIAGNOSIS — M79609 Pain in unspecified limb: Secondary | ICD-10-CM | POA: Diagnosis not present

## 2012-06-04 DIAGNOSIS — B351 Tinea unguium: Secondary | ICD-10-CM | POA: Diagnosis not present

## 2012-06-04 DIAGNOSIS — S43429A Sprain of unspecified rotator cuff capsule, initial encounter: Secondary | ICD-10-CM | POA: Diagnosis not present

## 2012-06-15 ENCOUNTER — Encounter: Payer: Self-pay | Admitting: Unknown Physician Specialty

## 2012-06-15 DIAGNOSIS — R262 Difficulty in walking, not elsewhere classified: Secondary | ICD-10-CM | POA: Diagnosis not present

## 2012-06-15 DIAGNOSIS — IMO0001 Reserved for inherently not codable concepts without codable children: Secondary | ICD-10-CM | POA: Diagnosis not present

## 2012-06-15 DIAGNOSIS — R42 Dizziness and giddiness: Secondary | ICD-10-CM | POA: Diagnosis not present

## 2012-06-15 DIAGNOSIS — R269 Unspecified abnormalities of gait and mobility: Secondary | ICD-10-CM | POA: Diagnosis not present

## 2012-06-18 ENCOUNTER — Telehealth: Payer: Self-pay | Admitting: Internal Medicine

## 2012-06-18 MED ORDER — DIAZEPAM 10 MG PO TABS: 10.0000 mg | ORAL_TABLET | Freq: Every evening | ORAL | Status: DC | PRN

## 2012-06-18 NOTE — Telephone Encounter (Signed)
Please advise on patients refill request [name]

## 2012-06-18 NOTE — Telephone Encounter (Signed)
Repeat call about the medication below.

## 2012-06-18 NOTE — Telephone Encounter (Signed)
Refill for diazepam 10 mg at bedtime as needed authorized in epic and on printer .

## 2012-06-18 NOTE — Telephone Encounter (Signed)
Pt is needing rx written for Januvia 100mg , Bystolic and Diazetam 10 mg. And she wanted to know if she could get samples of Bystolc and Januvia. I went and looked and we do have samples of those back there. Pt's b./p is running 165/95.

## 2012-06-18 NOTE — Telephone Encounter (Signed)
Gave patient her Samples.

## 2012-06-19 ENCOUNTER — Other Ambulatory Visit: Payer: Self-pay

## 2012-06-19 NOTE — Telephone Encounter (Signed)
rx diazepam 10 mg faxed to [facility]

## 2012-07-11 ENCOUNTER — Telehealth: Payer: Self-pay | Admitting: Internal Medicine

## 2012-07-11 MED ORDER — GLUCOSE BLOOD VI DISK: DISK | Status: DC

## 2012-07-11 NOTE — Telephone Encounter (Signed)
[facility]  [address] p [phone] f [phone]  Breeze 2 disc test strips 50 ea Refills 2 Last filled 03/13/11

## 2012-07-11 NOTE — Telephone Encounter (Signed)
Med filled.  

## 2012-07-14 DIAGNOSIS — R0989 Other specified symptoms and signs involving the circulatory and respiratory systems: Secondary | ICD-10-CM | POA: Diagnosis not present

## 2012-07-14 DIAGNOSIS — R0609 Other forms of dyspnea: Secondary | ICD-10-CM | POA: Diagnosis not present

## 2012-07-14 DIAGNOSIS — I200 Unstable angina: Secondary | ICD-10-CM | POA: Diagnosis not present

## 2012-07-15 DIAGNOSIS — R350 Frequency of micturition: Secondary | ICD-10-CM | POA: Diagnosis not present

## 2012-07-15 DIAGNOSIS — N318 Other neuromuscular dysfunction of bladder: Secondary | ICD-10-CM | POA: Diagnosis not present

## 2012-07-15 DIAGNOSIS — E275 Adrenomedullary hyperfunction: Secondary | ICD-10-CM | POA: Insufficient documentation

## 2012-07-15 DIAGNOSIS — N3941 Urge incontinence: Secondary | ICD-10-CM | POA: Insufficient documentation

## 2012-07-15 DIAGNOSIS — N319 Neuromuscular dysfunction of bladder, unspecified: Secondary | ICD-10-CM | POA: Insufficient documentation

## 2012-07-15 DIAGNOSIS — N3281 Overactive bladder: Secondary | ICD-10-CM | POA: Insufficient documentation

## 2012-07-15 DIAGNOSIS — R339 Retention of urine, unspecified: Secondary | ICD-10-CM | POA: Diagnosis not present

## 2012-07-15 DIAGNOSIS — D4410 Neoplasm of uncertain behavior of unspecified adrenal gland: Secondary | ICD-10-CM | POA: Diagnosis not present

## 2012-07-21 ENCOUNTER — Other Ambulatory Visit: Payer: Self-pay | Admitting: Internal Medicine

## 2012-07-21 DIAGNOSIS — E119 Type 2 diabetes mellitus without complications: Secondary | ICD-10-CM | POA: Diagnosis not present

## 2012-07-21 DIAGNOSIS — R079 Chest pain, unspecified: Secondary | ICD-10-CM | POA: Diagnosis not present

## 2012-07-21 DIAGNOSIS — I209 Angina pectoris, unspecified: Secondary | ICD-10-CM | POA: Diagnosis not present

## 2012-07-21 LAB — CBC WITH DIFFERENTIAL/PLATELET
Basophil #: 0.1 10*3/uL (ref 0.0–0.1)
Basophil %: 1.2 %
Eosinophil #: 0.1 10*3/uL (ref 0.0–0.7)
Eosinophil %: 1.1 %
HCT: 33.9 % — ABNORMAL LOW (ref 35.0–47.0)
HGB: 10.3 g/dL — ABNORMAL LOW (ref 12.0–16.0)
Lymphocyte #: 2 10*3/uL (ref 1.0–3.6)
Lymphocyte %: 22.1 %
MCH: 22.8 pg — ABNORMAL LOW (ref 26.0–34.0)
MCHC: 30.4 g/dL — ABNORMAL LOW (ref 32.0–36.0)
MCV: 75 fL — ABNORMAL LOW (ref 80–100)
Monocyte #: 0.4 x10 3/mm (ref 0.2–0.9)
Monocyte %: 4.3 %
Neutrophil #: 6.5 10*3/uL (ref 1.4–6.5)
Neutrophil %: 71.3 %
Platelet: 390 10*3/uL (ref 150–440)
RBC: 4.53 10*6/uL (ref 3.80–5.20)
RDW: 19.8 % — ABNORMAL HIGH (ref 11.5–14.5)
WBC: 9.1 10*3/uL (ref 3.6–11.0)

## 2012-07-21 LAB — COMPREHENSIVE METABOLIC PANEL
Albumin: 3.1 g/dL — ABNORMAL LOW (ref 3.4–5.0)
Alkaline Phosphatase: 154 U/L — ABNORMAL HIGH (ref 50–136)
Anion Gap: 12 (ref 7–16)
BUN: 16 mg/dL (ref 7–18)
Bilirubin,Total: 0.2 mg/dL (ref 0.2–1.0)
Calcium, Total: 8.3 mg/dL — ABNORMAL LOW (ref 8.5–10.1)
Chloride: 109 mmol/L — ABNORMAL HIGH (ref 98–107)
Co2: 20 mmol/L — ABNORMAL LOW (ref 21–32)
Creatinine: 1.02 mg/dL (ref 0.60–1.30)
EGFR (African American): >60
EGFR (Non-African Amer.): 57 — ABNORMAL LOW
Glucose: 247 mg/dL — ABNORMAL HIGH (ref 65–99)
Osmolality: 291 (ref 275–301)
Potassium: 3.8 mmol/L (ref 3.5–5.1)
SGOT(AST): 16 U/L (ref 15–37)
SGPT (ALT): 16 U/L (ref 12–78)
Sodium: 141 mmol/L (ref 136–145)
Total Protein: 7.7 g/dL (ref 6.4–8.2)

## 2012-07-24 ENCOUNTER — Ambulatory Visit: Payer: Self-pay | Admitting: Internal Medicine

## 2012-07-24 DIAGNOSIS — I2510 Atherosclerotic heart disease of native coronary artery without angina pectoris: Secondary | ICD-10-CM | POA: Diagnosis not present

## 2012-07-24 DIAGNOSIS — I200 Unstable angina: Secondary | ICD-10-CM | POA: Diagnosis not present

## 2012-07-24 DIAGNOSIS — R0989 Other specified symptoms and signs involving the circulatory and respiratory systems: Secondary | ICD-10-CM | POA: Diagnosis not present

## 2012-07-24 DIAGNOSIS — E119 Type 2 diabetes mellitus without complications: Secondary | ICD-10-CM | POA: Diagnosis not present

## 2012-07-24 DIAGNOSIS — R0602 Shortness of breath: Secondary | ICD-10-CM | POA: Diagnosis not present

## 2012-07-24 DIAGNOSIS — E669 Obesity, unspecified: Secondary | ICD-10-CM | POA: Diagnosis not present

## 2012-07-24 DIAGNOSIS — R079 Chest pain, unspecified: Secondary | ICD-10-CM | POA: Diagnosis not present

## 2012-07-24 DIAGNOSIS — R0609 Other forms of dyspnea: Secondary | ICD-10-CM | POA: Diagnosis not present

## 2012-07-24 DIAGNOSIS — I10 Essential (primary) hypertension: Secondary | ICD-10-CM | POA: Diagnosis not present

## 2012-07-24 DIAGNOSIS — K219 Gastro-esophageal reflux disease without esophagitis: Secondary | ICD-10-CM | POA: Diagnosis not present

## 2012-07-24 DIAGNOSIS — Z79899 Other long term (current) drug therapy: Secondary | ICD-10-CM | POA: Diagnosis not present

## 2012-07-24 HISTORY — PX: LEFT HEART CATH AND CORONARY ANGIOGRAPHY: CATH118249

## 2012-07-25 ENCOUNTER — Encounter: Payer: Self-pay | Admitting: Unknown Physician Specialty

## 2012-07-28 ENCOUNTER — Telehealth: Payer: Self-pay | Admitting: General Practice

## 2012-07-28 NOTE — Telephone Encounter (Signed)
Pt called stating that she had a heart cath placed on 07/24/12 by Dr. Clayborn Bigness Pt was advised Dr. Clayborn Bigness was going to call to see where pt needs to go next. Was this call placed? Pt had procedure without assistance due to IV not being able to be placed. Pt stating that she is feeling under the weather at this time. Please advise.

## 2012-07-28 NOTE — Telephone Encounter (Signed)
NO I have no notes from recent heart cath by Dr. Clayborn Bigness.   She needs to call his office to find out  where to go.    The second part about being "under the weather"?  Cannot treat  Or decide how to manage based on that info.

## 2012-07-29 ENCOUNTER — Telehealth: Payer: Self-pay | Admitting: Internal Medicine

## 2012-07-29 ENCOUNTER — Ambulatory Visit (INDEPENDENT_AMBULATORY_CARE_PROVIDER_SITE_OTHER): Payer: Medicare Other | Admitting: Internal Medicine

## 2012-07-29 ENCOUNTER — Encounter: Payer: Self-pay | Admitting: Internal Medicine

## 2012-07-29 VITALS — BP 136/72 | HR 78 | Temp 98.2°F | Resp 16 | Wt 217.0 lb

## 2012-07-29 DIAGNOSIS — E08610 Diabetes mellitus due to underlying condition with diabetic neuropathic arthropathy: Secondary | ICD-10-CM

## 2012-07-29 DIAGNOSIS — E119 Type 2 diabetes mellitus without complications: Secondary | ICD-10-CM

## 2012-07-29 DIAGNOSIS — R110 Nausea: Secondary | ICD-10-CM

## 2012-07-29 DIAGNOSIS — I10 Essential (primary) hypertension: Secondary | ICD-10-CM

## 2012-07-29 DIAGNOSIS — G4733 Obstructive sleep apnea (adult) (pediatric): Secondary | ICD-10-CM

## 2012-07-29 DIAGNOSIS — Z9989 Dependence on other enabling machines and devices: Secondary | ICD-10-CM

## 2012-07-29 DIAGNOSIS — K222 Esophageal obstruction: Secondary | ICD-10-CM

## 2012-07-29 DIAGNOSIS — F5105 Insomnia due to other mental disorder: Secondary | ICD-10-CM

## 2012-07-29 DIAGNOSIS — E279 Disorder of adrenal gland, unspecified: Secondary | ICD-10-CM

## 2012-07-29 DIAGNOSIS — G4710 Hypersomnia, unspecified: Secondary | ICD-10-CM

## 2012-07-29 DIAGNOSIS — E278 Other specified disorders of adrenal gland: Secondary | ICD-10-CM

## 2012-07-29 DIAGNOSIS — I447 Left bundle-branch block, unspecified: Secondary | ICD-10-CM | POA: Diagnosis not present

## 2012-07-29 DIAGNOSIS — E1349 Other specified diabetes mellitus with other diabetic neurological complication: Secondary | ICD-10-CM

## 2012-07-29 DIAGNOSIS — F419 Anxiety disorder, unspecified: Secondary | ICD-10-CM

## 2012-07-29 MED ORDER — ESTRADIOL 0.5 MG PO TABS: 0.5000 mg | ORAL_TABLET | Freq: Every day | ORAL | Status: DC

## 2012-07-29 MED ORDER — DIAZEPAM 10 MG PO TABS: 10.0000 mg | ORAL_TABLET | Freq: Every evening | ORAL | Status: DC | PRN

## 2012-07-29 MED ORDER — ESZOPICLONE 2 MG PO TABS: 2.0000 mg | ORAL_TABLET | Freq: Every day | ORAL | Status: DC

## 2012-07-29 MED ORDER — OXYCODONE-ACETAMINOPHEN 5-325 MG PO TABS: 1.0000 | ORAL_TABLET | Freq: Four times a day (QID) | ORAL | Status: DC | PRN

## 2012-07-29 MED ORDER — HYDROCODONE-ACETAMINOPHEN 5-500 MG PO CAPS: 1.0000 | ORAL_CAPSULE | Freq: Four times a day (QID) | ORAL | Status: DC | PRN

## 2012-07-29 MED ORDER — PREGABALIN 50 MG PO CAPS: 50.0000 mg | ORAL_CAPSULE | Freq: Three times a day (TID) | ORAL | Status: DC

## 2012-07-29 MED ORDER — LOSARTAN POTASSIUM 50 MG PO TABS: 50.0000 mg | ORAL_TABLET | Freq: Every day | ORAL | Status: DC

## 2012-07-29 MED ORDER — ROPINIROLE HCL 1 MG PO TABS: 1.0000 mg | ORAL_TABLET | Freq: Every day | ORAL | Status: DC

## 2012-07-29 MED ORDER — NEBIVOLOL HCL 5 MG PO TABS: 5.0000 mg | ORAL_TABLET | Freq: Every day | ORAL | Status: DC

## 2012-07-29 MED ORDER — SITAGLIPTIN PHOSPHATE 100 MG PO TABS: 100.0000 mg | ORAL_TABLET | Freq: Every day | ORAL | Status: DC

## 2012-07-29 NOTE — Telephone Encounter (Signed)
Do you have a contact number on Dr. Clayborn Bigness?

## 2012-07-29 NOTE — Telephone Encounter (Signed)
Meds filled per pt.

## 2012-07-29 NOTE — Progress Notes (Signed)
Patient ID: Denise Sharp, female   DOB: [birth year], 67 y.o.   MRN: [id number]  Patient Active Problem List  Diagnosis  . Hypersomnolence  . Hypertension  . 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 Hearing loss, right  . Adrenal mass  . Irritable bowel syndrome  . Restless legs syndrome  . Degenerative disk disease  . Cervical stenosis of spinal canal  . Sciatica  . Headache, temporal  . Bundle branch block, left  . Insomnia secondary to anxiety  . Esophageal stenosis  . OSA on CPAP    Subjective:  CC:   Chief Complaint  Patient presents with  . Nausea    HPI:   Denise Sharp a 67 y.o. female who presents for followup on chronic issuesShe has been having daily nausea for several months Chronic nausea for months,   accompanied by an 8 pound weight loss since September .All food bothering her stomach... more recently she's developed sinus pressure  sore throat, t 99. 4 start after her cardiac cath on Thursday morning.    She continues to have headaches nausea dizziness and labile hypertension on daily basis. She has no signs of pheochromocytoma by urinary catecholamine study. Cortisol levels were normal and renin angiotensin enzyme levels were also normal. Her cardiac catheterization was normal as as were her renal arteries, per discussion with plan:  She Has not had a sleep study in several years and feels that she is averaging 2 hours of sleep per night despite using her current CPAP. However her machine is almost 67 years old.[name]   Her Daughter Barney Drain saw Dr . Dionne Milo recently for weight loss accompanied by dysphagia and was diagnosed with a hereditary inflammatory disorder of the esophagus which causes stenosis and can be treated.. Patient wonders if she has the same condition.   2)Diabetes followup.  Sugars are "pretty good" 110 to 120 most of the iime when  she checks it, but she has not brought her sugar log with her.  Her last A1c was in may and was elevated at 7.6    Not going to PT since December ,  Seeing an orthopedist for left shoulder pain.      Past Medical History  Diagnosis Date  . Diabetes mellitus   . Depression   . Hypertension   . Cervical spinal stenosis 1994    due to trauma to back ([employer] accident), has intermittent paralysis and parasthesias  . Adrenal mass   . IBS (irritable bowel syndrome)   . Obstructive sleep apnea     using CPAP  . Anemia     iron deficient post  2 unit txfsn 2009, normal endo/colonoscopy by [facility]  . Esophageal stenosis March 2011    with transietn outlet obstruction by food, cleared by EGD   . LBBB (left bundle branch block)     Past Surgical History  Procedure Date  . Gastric bypass   . Abdominal hysterectomy   . Gastric bypass 2000, 2005    Dr. Debroah Loop  . Appendectomy   . Gallbladder surgery 1976  . Rotator cuff repair     right  . Joint replacement 2007    bilateral knee. [name],  [name]  . Spine surgery 1995    [place]  . Interstim implant placement April 2013    [place]         The following portions of the patient's history were reviewed and updated as appropriate: Allergies, current medications, and problem  list.    Review of Systems:   12 Pt  review of systems was negative except those addressed in the HPI,     History   Social History  . Marital Status: Married    Spouse Name: N/A    Number of Children: N/A  . Years of Education: N/A   Occupational History  . Not on file.   Social History Main Topics  . Smoking status: Never Smoker   . Smokeless tobacco: Never Used  . Alcohol Use: No  . Drug Use: No  . Sexually Active: Not on file   Other Topics Concern  . Not on file   Social History Narrative  . No narrative on file    Objective:  BP 136/72  Pulse 78  Temp 98.2 F (36.8 C) (Oral)  Resp 16  Wt 217 lb (98.431 kg)  SpO2 96%  General appearance: alert, cooperative and appears stated age Ears:  normal TM's and external ear canals both ears Throat: lips, mucosa, and tongue normal; teeth and gums normal Neck: no adenopathy, no carotid bruit, supple, symmetrical, trachea midline and thyroid not enlarged, symmetric, no tenderness/mass/nodules Back: symmetric, no curvature. ROM normal. No CVA tenderness. Lungs: clear to auscultation bilaterally Heart: regular rate and rhythm, S1, S2 normal, no murmur, click, rub or gallop Abdomen: soft, non-tender; bowel sounds normal; no masses,  no organomegaly Pulses: 2+ and symmetric Skin: Skin color, texture, turgor normal. No rashes or lesions Lymph nodes: Cervical, supraclavicular, and axillary nodes normal.  Assessment and Plan:  Adrenal mass Serial CTs show no change in the adrenal mass. Urinary catecholamine study was normal. Continue annual CT surveillance  Esophageal stenosis With nausea and dysphagia now causing weight loss. Refer back to Dr Dionne Milo for evaluation given the recent diagnosis of her daughter with some hereditary condition causing esophageal stenosis.  Hypersomnolence Accompanied by a labile hypertension and recurrent headaches. I a setting her up for a repeat sleep study with CPAP titration.   Hypertension Relatively well controlled today, but she has frequent episodes of systolics ranging in the A999333 on. Dr. Towanda Malkin is found no source angiographically of coronary artery disease or renal artery stenosis. I suspect that her uncontrolled hypertension is due to inadequately treated sleep apnea versus anxiety. She has had repeat urinary catecholamine studies and cortisol studies which were normal.  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 A1c was drawn today. Per patient her blood sugars have been in the normal zone. Reminder for diabetic eye exam given.   Updated Medication List Outpatient Encounter Prescriptions as of 07/29/2012  Medication Sig  Dispense Refill  . cyanocobalamin (,VITAMIN B-12,) 1000 MCG/ML injection Takes 2 injections twice a month.      . Glucose Blood (BAYER BREEZE 2 TEST) DISK Use one test strip each time glucose needs testing.  50 each  2  . Insulin Aspart Prot & Aspart (NOVOLOG MIX 70/30 FLEXPEN [place]) Inject into the skin as needed. Sliding scale      . Loperamide HCl (IMODIUM PO) Take by mouth.        . traMADol (ULTRAM) 50 MG tablet Take 1 tablet (50 mg total) by mouth 4 (four) times daily.  360 tablet  3  . [DISCONTINUED] diazepam (VALIUM) 10 MG tablet Take 1 tablet (10 mg total) by mouth at bedtime as needed for anxiety.  30 tablet  3  . [DISCONTINUED] estradiol (ESTRACE) 0.5 MG tablet Take 1 tablet (0.5 mg  total) by mouth daily.  90 tablet  3  . [DISCONTINUED] eszopiclone (LUNESTA) 2 MG TABS Take 1 tablet (2 mg total) by mouth at bedtime. Take immediately before bedtime  90 tablet  3  . [DISCONTINUED] hydrocodone-acetaminophen (LORCET-HD) 5-500 MG per capsule Take 1 capsule by mouth every 6 (six) hours as needed.  120 capsule  3  . [DISCONTINUED] losartan (COZAAR) 50 MG tablet Take 1 tablet (50 mg total) by mouth daily.  90 tablet  3  . [DISCONTINUED] nebivolol (BYSTOLIC) 5 MG tablet Take 5 mg by mouth daily.      . [DISCONTINUED] oxyCODONE-acetaminophen (PERCOCET) 5-325 MG per tablet Take 1 tablet by mouth every 6 (six) hours as needed.        . [DISCONTINUED] pregabalin (LYRICA) 50 MG capsule Take 1 capsule (50 mg total) by mouth 3 (three) times daily.  270 capsule  3  . [DISCONTINUED] rOPINIRole (REQUIP) 1 MG tablet Take 1 tablet (1 mg total) by mouth at bedtime.  90 tablet  5  . [DISCONTINUED] sitaGLIPtin (JANUVIA) 100 MG tablet Take 1 tablet (100 mg total) by mouth daily.  90 tablet  3     Orders Placed This Encounter  Procedures  . Microalbumin / creatinine urine ratio  . Hemoglobin A1c  . Comprehensive metabolic panel  . LDL cholesterol, direct  . Microalbumin / creatinine urine ratio  .  Comprehensive metabolic panel  . LDL cholesterol, direct  . Hemoglobin A1c  . Ambulatory referral to Gastroenterology  . Nocturnal polysomnography (NPSG)    No Follow-up on file.

## 2012-07-29 NOTE — Assessment & Plan Note (Signed)
With nausea and dysphagia now causing weight loss. Refer back to Dr Dionne Milo for evaluation given the recent diagnosis of her daughter with some hereditary condition causing esophageal stenosis.

## 2012-07-29 NOTE — Assessment & Plan Note (Signed)
Accompanied by a labile hypertension and recurrent headaches. I a setting her up for a repeat sleep study with CPAP titration.

## 2012-07-29 NOTE — Assessment & Plan Note (Signed)
Relatively well controlled today, but she has frequent episodes of systolics ranging in the A999333 on. Dr. Towanda Malkin is found no source angiographically of coronary artery disease or renal artery stenosis. I suspect that her uncontrolled hypertension is due to inadequately treated sleep apnea versus anxiety. She has had repeat urinary catecholamine studies and cortisol studies which were normal.

## 2012-07-29 NOTE — Assessment & Plan Note (Signed)
Her last A1c was 7.6 in May. A1c was drawn today. Per patient her blood sugars have been in the normal zone. Reminder for diabetic eye exam given.

## 2012-07-29 NOTE — Telephone Encounter (Signed)
Pt notified at her appt today.

## 2012-07-29 NOTE — Telephone Encounter (Signed)
Pt was upset when notified that she should call Dr. Clayborn Bigness to get the results. I will call today to request those.

## 2012-07-29 NOTE — Assessment & Plan Note (Signed)
Serial CTs show no change in the adrenal mass. Urinary catecholamine study was normal. Continue annual CT surveillance

## 2012-07-29 NOTE — Telephone Encounter (Signed)
Spoke with Dr. Clayborn Bigness,  Her coronaries are clear,  No blockages,  So are her renal arteries.  Urine and blood tests for pheocromocytoma and adrenal excess hormones were normal.  I recommend  A CPAP titration study to rule out her sleep apnea not being adequately treated as the cause of her elevated blood pressure

## 2012-07-30 ENCOUNTER — Telehealth: Payer: Self-pay | Admitting: *Deleted

## 2012-07-30 DIAGNOSIS — E119 Type 2 diabetes mellitus without complications: Secondary | ICD-10-CM | POA: Diagnosis not present

## 2012-07-30 DIAGNOSIS — S43429A Sprain of unspecified rotator cuff capsule, initial encounter: Secondary | ICD-10-CM | POA: Diagnosis not present

## 2012-07-30 NOTE — Telephone Encounter (Signed)
Afrin nasal spray for nighttime congestion, Sudafe PE 10 mg every 6 hours for daytime congestion , robitussin or delsym for cough.

## 2012-07-30 NOTE — Telephone Encounter (Signed)
Pt.notified

## 2012-07-30 NOTE — Telephone Encounter (Signed)
Pt called saying; what should she do about her stopped up head and cough that she is having, that it was never dicussed yesterday

## 2012-07-31 ENCOUNTER — Telehealth: Payer: Self-pay | Admitting: Internal Medicine

## 2012-07-31 NOTE — Telephone Encounter (Signed)
A1c is at goal of 7.0  LDL.  NO medication changes needed.

## 2012-08-01 NOTE — Telephone Encounter (Signed)
LMOVM for pt to return call 

## 2012-08-04 DIAGNOSIS — R0609 Other forms of dyspnea: Secondary | ICD-10-CM | POA: Diagnosis not present

## 2012-08-04 DIAGNOSIS — I10 Essential (primary) hypertension: Secondary | ICD-10-CM | POA: Diagnosis not present

## 2012-08-04 DIAGNOSIS — R0989 Other specified symptoms and signs involving the circulatory and respiratory systems: Secondary | ICD-10-CM | POA: Diagnosis not present

## 2012-08-04 NOTE — Telephone Encounter (Signed)
Pt.notified

## 2012-08-05 ENCOUNTER — Telehealth: Payer: Self-pay | Admitting: Internal Medicine

## 2012-08-05 ENCOUNTER — Emergency Department: Payer: Self-pay | Admitting: Emergency Medicine

## 2012-08-05 DIAGNOSIS — Z79899 Other long term (current) drug therapy: Secondary | ICD-10-CM | POA: Diagnosis not present

## 2012-08-05 DIAGNOSIS — E119 Type 2 diabetes mellitus without complications: Secondary | ICD-10-CM | POA: Diagnosis not present

## 2012-08-05 DIAGNOSIS — D649 Anemia, unspecified: Secondary | ICD-10-CM | POA: Diagnosis not present

## 2012-08-05 DIAGNOSIS — R6889 Other general symptoms and signs: Secondary | ICD-10-CM | POA: Diagnosis not present

## 2012-08-05 DIAGNOSIS — I10 Essential (primary) hypertension: Secondary | ICD-10-CM | POA: Diagnosis not present

## 2012-08-05 DIAGNOSIS — K589 Irritable bowel syndrome without diarrhea: Secondary | ICD-10-CM | POA: Diagnosis not present

## 2012-08-05 LAB — BASIC METABOLIC PANEL
Anion Gap: 8 (ref 7–16)
BUN: 12 mg/dL (ref 7–18)
Calcium, Total: 7.8 mg/dL — ABNORMAL LOW (ref 8.5–10.1)
Chloride: 112 mmol/L — ABNORMAL HIGH (ref 98–107)
Co2: 24 mmol/L (ref 21–32)
Creatinine: 0.8 mg/dL (ref 0.60–1.30)
EGFR (African American): >60
EGFR (Non-African Amer.): >60
Glucose: 87 mg/dL (ref 65–99)
Osmolality: 286 (ref 275–301)
Potassium: 3.5 mmol/L (ref 3.5–5.1)
Sodium: 144 mmol/L (ref 136–145)

## 2012-08-05 LAB — URINALYSIS, COMPLETE
Bacteria: NONE SEEN
Bilirubin,UR: NEGATIVE
Blood: NEGATIVE
Glucose,UR: NEGATIVE mg/dL (ref 0–75)
Ketone: NEGATIVE
Nitrite: NEGATIVE
Ph: 5 (ref 4.5–8.0)
Protein: NEGATIVE
RBC,UR: 1 /HPF (ref 0–5)
Specific Gravity: 1.016 (ref 1.003–1.030)
Squamous Epithelial: 3
WBC UR: 1 /HPF (ref 0–5)

## 2012-08-05 LAB — CBC
HCT: 28.3 % — ABNORMAL LOW (ref 35.0–47.0)
HCT: 30.4 % — ABNORMAL LOW (ref 35.0–47.0)
HGB: 8.9 g/dL — ABNORMAL LOW (ref 12.0–16.0)
HGB: 9.4 g/dL — ABNORMAL LOW (ref 12.0–16.0)
MCH: 23.3 pg — ABNORMAL LOW (ref 26.0–34.0)
MCH: 23.1 pg — ABNORMAL LOW (ref 26.0–34.0)
MCHC: 31.4 g/dL — ABNORMAL LOW (ref 32.0–36.0)
MCHC: 30.9 g/dL — ABNORMAL LOW (ref 32.0–36.0)
MCV: 74 fL — ABNORMAL LOW (ref 80–100)
MCV: 75 fL — ABNORMAL LOW (ref 80–100)
Platelet: 304 10*3/uL (ref 150–440)
Platelet: 352 10*3/uL (ref 150–440)
RBC: 3.82 10*6/uL (ref 3.80–5.20)
RBC: 4.06 10*6/uL (ref 3.80–5.20)
RDW: 19 % — ABNORMAL HIGH (ref 11.5–14.5)
RDW: 19.5 % — ABNORMAL HIGH (ref 11.5–14.5)
WBC: 12.5 10*3/uL — ABNORMAL HIGH (ref 3.6–11.0)
WBC: 11.5 10*3/uL — ABNORMAL HIGH (ref 3.6–11.0)

## 2012-08-05 LAB — RETICULOCYTES
Absolute Retic Count: 0.041 10*6/uL (ref 0.023–0.096)
Reticulocyte: 1.1 % (ref 0.5–2.2)

## 2012-08-05 LAB — FOLATE: Folic Acid: 5.9 ng/mL (ref 3.1–100.0)

## 2012-08-05 LAB — MAGNESIUM: Magnesium: 1.5 mg/dL — ABNORMAL LOW

## 2012-08-05 LAB — FERRITIN: Ferritin (ARMC): 6 ng/mL — ABNORMAL LOW (ref 8–388)

## 2012-08-05 NOTE — Telephone Encounter (Addendum)
Patient had a cardiac cath last Thursday . Dr. Clayborn Bigness said all the vessels in her heart are clear. Dr. Clayborn Bigness is going to talk to Dr. Fletcher Anon. She also has an appointment with a Gastroenterologist. Her blood pressure readings at 12:45 197/106 now 157/104. Transferred to [facility].

## 2012-08-05 NOTE — Telephone Encounter (Signed)
Patient Information:  Caller Name: Keshanna  Phone: [phone]  Patient: Denise Sharp, Denise Sharp  Gender: Female  DOB: Oct 15, 1945  Age: 67 Years  PCP: Deborra Medina (Adults only)  Office Follow Up:  Does the office need to follow up with this patient?: No  Instructions For The Office: N/A  RN Note:  Intermitent blurred vision with generalized weakness, severe headache and facial tingling for > 1.5 hours. Advised to call 911 now.  Symptoms  Reason For Call & Symptoms: Emergent call with hypertension, tingling of right side of face and severe headache behind Left ear. BP 197/106 at 1245, took Losartin and Bistolic; repeat BP at [id number] 157/104.  Reviewed Health History In EMR: N/A  Reviewed Medications In EMR: N/A  Reviewed Allergies In EMR: N/A  Reviewed Surgeries / Procedures: N/A  Date of Onset of Symptoms: 08/05/2012  Treatments Tried: changing time of day for BP medication,  Treatments Tried Worked: No  Guideline(s) Used:  Headache  Headache  Disposition Per Guideline:   Call EMS 911 Now  Reason For Disposition Reached:   Sounds like a life-threatening emergency to the triager  Advice Given:  N/A

## 2012-08-06 ENCOUNTER — Telehealth: Payer: Self-pay | Admitting: General Practice

## 2012-08-06 DIAGNOSIS — I10 Essential (primary) hypertension: Secondary | ICD-10-CM

## 2012-08-06 DIAGNOSIS — G4733 Obstructive sleep apnea (adult) (pediatric): Secondary | ICD-10-CM

## 2012-08-06 DIAGNOSIS — D649 Anemia, unspecified: Secondary | ICD-10-CM

## 2012-08-06 DIAGNOSIS — R519 Headache, unspecified: Secondary | ICD-10-CM

## 2012-08-06 DIAGNOSIS — D509 Iron deficiency anemia, unspecified: Secondary | ICD-10-CM | POA: Insufficient documentation

## 2012-08-06 DIAGNOSIS — Z9989 Dependence on other enabling machines and devices: Secondary | ICD-10-CM

## 2012-08-06 DIAGNOSIS — K222 Esophageal obstruction: Secondary | ICD-10-CM

## 2012-08-06 MED ORDER — MAGNESIUM OXIDE 400 MG PO TABS: 400.0000 mg | ORAL_TABLET | Freq: Every day | ORAL | Status: DC

## 2012-08-06 MED ORDER — FERROUS SULFATE 324 (65 FE) MG PO TBEC: 1.0000 | DELAYED_RELEASE_TABLET | Freq: Every day | ORAL | Status: DC

## 2012-08-06 MED ORDER — HYDRALAZINE HCL 25 MG PO TABS: 25.0000 mg | ORAL_TABLET | Freq: Three times a day (TID) | ORAL | Status: DC

## 2012-08-06 NOTE — Telephone Encounter (Signed)
Pt called stating that she is still dehydrated, and feeling dizzy. States BP is 137/93. Blood sugar is fine. States that she is thoroughly upset, and would like to know what is going on.

## 2012-08-06 NOTE — Telephone Encounter (Signed)
Pt notified of all results and treatment plans. Pt states that the only iron she can get is from Iron infusion from Dr. Lurline Del at the [facility] center.

## 2012-08-06 NOTE — Telephone Encounter (Signed)
See previous phone note.  

## 2012-08-06 NOTE — Telephone Encounter (Signed)
Furthermore,  Her anemia is stable!  hgb was 9. 4 in May,  She has chronic iron deficiency anemia (has been present sicne before 2009 colnoscopy)  and needs to take iron supplements.  She does not meet criteria for transfusion because the paramters have changed.

## 2012-08-06 NOTE — Assessment & Plan Note (Signed)
Advised to come in for IM injectiosn and oral supplements sent to pharmacy

## 2012-08-06 NOTE — Assessment & Plan Note (Signed)
Repeat sleep study ordered

## 2012-08-06 NOTE — Telephone Encounter (Signed)
Please tell her that I cannot tell her with surety what is happening,  Since when I see her she appears to be fine.  There are many reasons she soul be having recurrent headaches and elevated BP, ranging from untreated sleep apnea to vasculitis  To anxiey,   She has to have patience,because these are chronic issues and the workup is underway:   1) She needs to have the sleep study, which i have ordered,  2)  Along with the gastroenterology referral, and  3) I am also referriing her to Rheumatology for a second opinion on whether her headaches are due to vasculitis  I am sending an x for hydralazine ,  For blood pressure to her pharmacy.  25 mg every 8 hours as needed for bp >   She does not appear to be dehydrated based on the labs drawn in the ED, but if she feels she is should drink G2  Her magnesium is low.  If they did not give her a magnesium infusion in the ED she can come by here for 2 grams (1 gm in each buttock) and she will need to start magnesium oxide supplements.  I will call in.

## 2012-08-06 NOTE — Assessment & Plan Note (Addendum)
Stable by repeat eval in ED.  FOBT was again negative per ER doc

## 2012-08-06 NOTE — Assessment & Plan Note (Signed)
Referral to rhemotaology for second opinion on vasculitis. Sleep study to be repeated Adding hydralazine

## 2012-08-07 DIAGNOSIS — K229 Disease of esophagus, unspecified: Secondary | ICD-10-CM | POA: Diagnosis not present

## 2012-08-07 DIAGNOSIS — D509 Iron deficiency anemia, unspecified: Secondary | ICD-10-CM | POA: Diagnosis not present

## 2012-08-13 ENCOUNTER — Ambulatory Visit: Payer: Self-pay | Admitting: Gastroenterology

## 2012-08-13 DIAGNOSIS — Z79899 Other long term (current) drug therapy: Secondary | ICD-10-CM | POA: Diagnosis not present

## 2012-08-13 DIAGNOSIS — G589 Mononeuropathy, unspecified: Secondary | ICD-10-CM | POA: Diagnosis not present

## 2012-08-13 DIAGNOSIS — Z809 Family history of malignant neoplasm, unspecified: Secondary | ICD-10-CM | POA: Diagnosis not present

## 2012-08-13 DIAGNOSIS — Z881 Allergy status to other antibiotic agents status: Secondary | ICD-10-CM | POA: Diagnosis not present

## 2012-08-13 DIAGNOSIS — Z882 Allergy status to sulfonamides status: Secondary | ICD-10-CM | POA: Diagnosis not present

## 2012-08-13 DIAGNOSIS — D649 Anemia, unspecified: Secondary | ICD-10-CM | POA: Diagnosis not present

## 2012-08-13 DIAGNOSIS — D509 Iron deficiency anemia, unspecified: Secondary | ICD-10-CM | POA: Diagnosis not present

## 2012-08-13 DIAGNOSIS — R1310 Dysphagia, unspecified: Secondary | ICD-10-CM | POA: Diagnosis not present

## 2012-08-13 DIAGNOSIS — E119 Type 2 diabetes mellitus without complications: Secondary | ICD-10-CM | POA: Diagnosis not present

## 2012-08-13 DIAGNOSIS — Z8249 Family history of ischemic heart disease and other diseases of the circulatory system: Secondary | ICD-10-CM | POA: Diagnosis not present

## 2012-08-13 DIAGNOSIS — K648 Other hemorrhoids: Secondary | ICD-10-CM | POA: Diagnosis not present

## 2012-08-13 DIAGNOSIS — Z96659 Presence of unspecified artificial knee joint: Secondary | ICD-10-CM | POA: Diagnosis not present

## 2012-08-13 DIAGNOSIS — Z888 Allergy status to other drugs, medicaments and biological substances status: Secondary | ICD-10-CM | POA: Diagnosis not present

## 2012-08-13 DIAGNOSIS — J45909 Unspecified asthma, uncomplicated: Secondary | ICD-10-CM | POA: Diagnosis not present

## 2012-08-13 DIAGNOSIS — M1990 Unspecified osteoarthritis, unspecified site: Secondary | ICD-10-CM | POA: Diagnosis not present

## 2012-08-13 DIAGNOSIS — K649 Unspecified hemorrhoids: Secondary | ICD-10-CM | POA: Diagnosis not present

## 2012-08-13 DIAGNOSIS — K222 Esophageal obstruction: Secondary | ICD-10-CM | POA: Diagnosis not present

## 2012-08-13 DIAGNOSIS — Z9884 Bariatric surgery status: Secondary | ICD-10-CM | POA: Diagnosis not present

## 2012-08-14 ENCOUNTER — Encounter: Payer: Self-pay | Admitting: *Deleted

## 2012-08-14 ENCOUNTER — Ambulatory Visit: Payer: Self-pay | Admitting: Oncology

## 2012-08-14 ENCOUNTER — Ambulatory Visit: Payer: Self-pay | Admitting: Internal Medicine

## 2012-08-14 DIAGNOSIS — E669 Obesity, unspecified: Secondary | ICD-10-CM | POA: Diagnosis not present

## 2012-08-14 DIAGNOSIS — R0989 Other specified symptoms and signs involving the circulatory and respiratory systems: Secondary | ICD-10-CM | POA: Diagnosis not present

## 2012-08-14 DIAGNOSIS — R51 Headache: Secondary | ICD-10-CM | POA: Diagnosis not present

## 2012-08-14 DIAGNOSIS — G4733 Obstructive sleep apnea (adult) (pediatric): Secondary | ICD-10-CM | POA: Diagnosis not present

## 2012-08-14 DIAGNOSIS — G2589 Other specified extrapyramidal and movement disorders: Secondary | ICD-10-CM | POA: Diagnosis not present

## 2012-08-14 DIAGNOSIS — R0609 Other forms of dyspnea: Secondary | ICD-10-CM | POA: Diagnosis not present

## 2012-08-14 DIAGNOSIS — I10 Essential (primary) hypertension: Secondary | ICD-10-CM | POA: Diagnosis not present

## 2012-08-20 ENCOUNTER — Encounter: Payer: Self-pay | Admitting: Orthopedic Surgery

## 2012-08-20 ENCOUNTER — Ambulatory Visit: Payer: Self-pay | Admitting: Oncology

## 2012-08-20 DIAGNOSIS — D509 Iron deficiency anemia, unspecified: Secondary | ICD-10-CM | POA: Diagnosis not present

## 2012-08-20 DIAGNOSIS — Z794 Long term (current) use of insulin: Secondary | ICD-10-CM | POA: Diagnosis not present

## 2012-08-20 DIAGNOSIS — K219 Gastro-esophageal reflux disease without esophagitis: Secondary | ICD-10-CM | POA: Diagnosis not present

## 2012-08-20 DIAGNOSIS — E119 Type 2 diabetes mellitus without complications: Secondary | ICD-10-CM | POA: Diagnosis not present

## 2012-08-20 DIAGNOSIS — F329 Major depressive disorder, single episode, unspecified: Secondary | ICD-10-CM | POA: Diagnosis not present

## 2012-08-20 DIAGNOSIS — K589 Irritable bowel syndrome without diarrhea: Secondary | ICD-10-CM | POA: Diagnosis not present

## 2012-08-20 DIAGNOSIS — M25519 Pain in unspecified shoulder: Secondary | ICD-10-CM | POA: Diagnosis not present

## 2012-08-20 DIAGNOSIS — R293 Abnormal posture: Secondary | ICD-10-CM | POA: Diagnosis not present

## 2012-08-20 DIAGNOSIS — Z79899 Other long term (current) drug therapy: Secondary | ICD-10-CM | POA: Diagnosis not present

## 2012-08-20 DIAGNOSIS — M6281 Muscle weakness (generalized): Secondary | ICD-10-CM | POA: Diagnosis not present

## 2012-08-20 DIAGNOSIS — IMO0001 Reserved for inherently not codable concepts without codable children: Secondary | ICD-10-CM | POA: Diagnosis not present

## 2012-08-20 DIAGNOSIS — F3289 Other specified depressive episodes: Secondary | ICD-10-CM | POA: Diagnosis not present

## 2012-08-21 DIAGNOSIS — D509 Iron deficiency anemia, unspecified: Secondary | ICD-10-CM | POA: Diagnosis not present

## 2012-08-25 ENCOUNTER — Encounter: Payer: Self-pay | Admitting: Internal Medicine

## 2012-08-25 DIAGNOSIS — M25519 Pain in unspecified shoulder: Secondary | ICD-10-CM | POA: Diagnosis not present

## 2012-08-25 DIAGNOSIS — M6281 Muscle weakness (generalized): Secondary | ICD-10-CM | POA: Diagnosis not present

## 2012-08-25 DIAGNOSIS — R293 Abnormal posture: Secondary | ICD-10-CM | POA: Diagnosis not present

## 2012-08-25 DIAGNOSIS — IMO0001 Reserved for inherently not codable concepts without codable children: Secondary | ICD-10-CM | POA: Diagnosis not present

## 2012-08-30 ENCOUNTER — Other Ambulatory Visit: Payer: Self-pay

## 2012-09-02 ENCOUNTER — Ambulatory Visit: Payer: Self-pay | Admitting: Internal Medicine

## 2012-09-02 DIAGNOSIS — I10 Essential (primary) hypertension: Secondary | ICD-10-CM | POA: Diagnosis not present

## 2012-09-02 DIAGNOSIS — E1142 Type 2 diabetes mellitus with diabetic polyneuropathy: Secondary | ICD-10-CM | POA: Diagnosis not present

## 2012-09-02 DIAGNOSIS — R42 Dizziness and giddiness: Secondary | ICD-10-CM | POA: Diagnosis not present

## 2012-09-02 DIAGNOSIS — R0609 Other forms of dyspnea: Secondary | ICD-10-CM | POA: Diagnosis not present

## 2012-09-02 DIAGNOSIS — R51 Headache: Secondary | ICD-10-CM | POA: Diagnosis not present

## 2012-09-02 DIAGNOSIS — R0989 Other specified symptoms and signs involving the circulatory and respiratory systems: Secondary | ICD-10-CM | POA: Diagnosis not present

## 2012-09-02 DIAGNOSIS — G4733 Obstructive sleep apnea (adult) (pediatric): Secondary | ICD-10-CM | POA: Diagnosis not present

## 2012-09-02 DIAGNOSIS — E669 Obesity, unspecified: Secondary | ICD-10-CM | POA: Diagnosis not present

## 2012-09-02 DIAGNOSIS — G2589 Other specified extrapyramidal and movement disorders: Secondary | ICD-10-CM | POA: Diagnosis not present

## 2012-09-04 DIAGNOSIS — K229 Disease of esophagus, unspecified: Secondary | ICD-10-CM | POA: Diagnosis not present

## 2012-09-04 DIAGNOSIS — D649 Anemia, unspecified: Secondary | ICD-10-CM | POA: Diagnosis not present

## 2012-09-05 DIAGNOSIS — M25519 Pain in unspecified shoulder: Secondary | ICD-10-CM | POA: Diagnosis not present

## 2012-09-05 DIAGNOSIS — IMO0001 Reserved for inherently not codable concepts without codable children: Secondary | ICD-10-CM | POA: Diagnosis not present

## 2012-09-05 DIAGNOSIS — R293 Abnormal posture: Secondary | ICD-10-CM | POA: Diagnosis not present

## 2012-09-05 DIAGNOSIS — M6281 Muscle weakness (generalized): Secondary | ICD-10-CM | POA: Diagnosis not present

## 2012-09-10 DIAGNOSIS — B351 Tinea unguium: Secondary | ICD-10-CM | POA: Diagnosis not present

## 2012-09-10 DIAGNOSIS — M79609 Pain in unspecified limb: Secondary | ICD-10-CM | POA: Diagnosis not present

## 2012-09-12 DIAGNOSIS — IMO0001 Reserved for inherently not codable concepts without codable children: Secondary | ICD-10-CM | POA: Diagnosis not present

## 2012-09-12 DIAGNOSIS — M6281 Muscle weakness (generalized): Secondary | ICD-10-CM | POA: Diagnosis not present

## 2012-09-12 DIAGNOSIS — R293 Abnormal posture: Secondary | ICD-10-CM | POA: Diagnosis not present

## 2012-09-12 DIAGNOSIS — M25519 Pain in unspecified shoulder: Secondary | ICD-10-CM | POA: Diagnosis not present

## 2012-09-13 ENCOUNTER — Ambulatory Visit: Payer: Self-pay | Admitting: Oncology

## 2012-09-13 ENCOUNTER — Encounter: Payer: Self-pay | Admitting: Orthopedic Surgery

## 2012-09-17 ENCOUNTER — Encounter: Payer: Self-pay | Admitting: Internal Medicine

## 2012-09-24 ENCOUNTER — Ambulatory Visit: Payer: Self-pay | Admitting: Orthopedic Surgery

## 2012-09-24 DIAGNOSIS — M25569 Pain in unspecified knee: Secondary | ICD-10-CM | POA: Diagnosis not present

## 2012-09-24 DIAGNOSIS — M899 Disorder of bone, unspecified: Secondary | ICD-10-CM | POA: Diagnosis not present

## 2012-09-24 DIAGNOSIS — Q762 Congenital spondylolisthesis: Secondary | ICD-10-CM | POA: Diagnosis not present

## 2012-09-24 DIAGNOSIS — M949 Disorder of cartilage, unspecified: Secondary | ICD-10-CM | POA: Diagnosis not present

## 2012-09-24 DIAGNOSIS — M1610 Unilateral primary osteoarthritis, unspecified hip: Secondary | ICD-10-CM | POA: Diagnosis not present

## 2012-09-24 DIAGNOSIS — M533 Sacrococcygeal disorders, not elsewhere classified: Secondary | ICD-10-CM | POA: Diagnosis not present

## 2012-09-24 DIAGNOSIS — M169 Osteoarthritis of hip, unspecified: Secondary | ICD-10-CM | POA: Diagnosis not present

## 2012-09-24 DIAGNOSIS — S300XXA Contusion of lower back and pelvis, initial encounter: Secondary | ICD-10-CM | POA: Diagnosis not present

## 2012-09-24 DIAGNOSIS — Z96659 Presence of unspecified artificial knee joint: Secondary | ICD-10-CM | POA: Diagnosis not present

## 2012-09-24 DIAGNOSIS — M5137 Other intervertebral disc degeneration, lumbosacral region: Secondary | ICD-10-CM | POA: Diagnosis not present

## 2012-09-24 DIAGNOSIS — R159 Full incontinence of feces: Secondary | ICD-10-CM | POA: Diagnosis not present

## 2012-09-24 DIAGNOSIS — M545 Low back pain, unspecified: Secondary | ICD-10-CM | POA: Diagnosis not present

## 2012-10-01 ENCOUNTER — Telehealth: Payer: Self-pay | Admitting: General Practice

## 2012-10-01 DIAGNOSIS — R5381 Other malaise: Secondary | ICD-10-CM | POA: Diagnosis not present

## 2012-10-01 DIAGNOSIS — R5383 Other fatigue: Secondary | ICD-10-CM | POA: Diagnosis not present

## 2012-10-01 DIAGNOSIS — F419 Anxiety disorder, unspecified: Secondary | ICD-10-CM

## 2012-10-01 DIAGNOSIS — M19079 Primary osteoarthritis, unspecified ankle and foot: Secondary | ICD-10-CM | POA: Diagnosis not present

## 2012-10-01 DIAGNOSIS — M5030 Other cervical disc degeneration, unspecified cervical region: Secondary | ICD-10-CM | POA: Diagnosis not present

## 2012-10-01 DIAGNOSIS — M5137 Other intervertebral disc degeneration, lumbosacral region: Secondary | ICD-10-CM | POA: Diagnosis not present

## 2012-10-01 MED ORDER — ROPINIROLE HCL 1 MG PO TABS: 1.0000 mg | ORAL_TABLET | Freq: Every day | ORAL | Status: DC

## 2012-10-01 MED ORDER — ESTRADIOL 0.5 MG PO TABS: 0.5000 mg | ORAL_TABLET | Freq: Every day | ORAL | Status: DC

## 2012-10-01 MED ORDER — MAGNESIUM OXIDE 400 MG PO TABS: 400.0000 mg | ORAL_TABLET | Freq: Every day | ORAL | Status: DC

## 2012-10-01 MED ORDER — NEBIVOLOL HCL 5 MG PO TABS: 5.0000 mg | ORAL_TABLET | Freq: Every day | ORAL | Status: DC

## 2012-10-01 MED ORDER — LOSARTAN POTASSIUM 50 MG PO TABS: 50.0000 mg | ORAL_TABLET | Freq: Every day | ORAL | Status: DC

## 2012-10-01 MED ORDER — TRAMADOL HCL 50 MG PO TABS: 50.0000 mg | ORAL_TABLET | Freq: Four times a day (QID) | ORAL | Status: DC

## 2012-10-01 MED ORDER — SITAGLIPTIN PHOSPHATE 100 MG PO TABS: 100.0000 mg | ORAL_TABLET | Freq: Every day | ORAL | Status: DC

## 2012-10-01 MED ORDER — HYDRALAZINE HCL 25 MG PO TABS: 25.0000 mg | ORAL_TABLET | Freq: Three times a day (TID) | ORAL | Status: DC

## 2012-10-01 MED ORDER — PANTOPRAZOLE SODIUM 40 MG PO TBEC: 40.0000 mg | DELAYED_RELEASE_TABLET | Freq: Every day | ORAL | Status: DC

## 2012-10-01 MED ORDER — PREGABALIN 50 MG PO CAPS: 50.0000 mg | ORAL_CAPSULE | Freq: Three times a day (TID) | ORAL | Status: DC

## 2012-10-01 NOTE — Telephone Encounter (Signed)
Pt needs all meds filled and printed for 90 days to start initial Champva mail order. This was completed and placed in your signature folder.

## 2012-10-09 DIAGNOSIS — R197 Diarrhea, unspecified: Secondary | ICD-10-CM | POA: Diagnosis not present

## 2012-10-10 ENCOUNTER — Encounter: Payer: Self-pay | Admitting: Internal Medicine

## 2012-10-10 ENCOUNTER — Ambulatory Visit (INDEPENDENT_AMBULATORY_CARE_PROVIDER_SITE_OTHER): Payer: Medicare Other | Admitting: Internal Medicine

## 2012-10-10 VITALS — BP 158/82 | HR 71 | Temp 98.4°F | Resp 17 | Wt 228.8 lb

## 2012-10-10 DIAGNOSIS — K589 Irritable bowel syndrome without diarrhea: Secondary | ICD-10-CM | POA: Diagnosis not present

## 2012-10-10 DIAGNOSIS — F411 Generalized anxiety disorder: Secondary | ICD-10-CM

## 2012-10-10 DIAGNOSIS — D509 Iron deficiency anemia, unspecified: Secondary | ICD-10-CM | POA: Diagnosis not present

## 2012-10-10 DIAGNOSIS — K591 Functional diarrhea: Secondary | ICD-10-CM | POA: Diagnosis not present

## 2012-10-10 DIAGNOSIS — Z9989 Dependence on other enabling machines and devices: Secondary | ICD-10-CM

## 2012-10-10 DIAGNOSIS — F5105 Insomnia due to other mental disorder: Secondary | ICD-10-CM

## 2012-10-10 DIAGNOSIS — I10 Essential (primary) hypertension: Secondary | ICD-10-CM | POA: Diagnosis not present

## 2012-10-10 DIAGNOSIS — E1349 Other specified diabetes mellitus with other diabetic neurological complication: Secondary | ICD-10-CM | POA: Diagnosis not present

## 2012-10-10 DIAGNOSIS — R141 Gas pain: Secondary | ICD-10-CM | POA: Diagnosis not present

## 2012-10-10 DIAGNOSIS — E08610 Diabetes mellitus due to underlying condition with diabetic neuropathic arthropathy: Secondary | ICD-10-CM

## 2012-10-10 DIAGNOSIS — R142 Eructation: Secondary | ICD-10-CM | POA: Diagnosis not present

## 2012-10-10 DIAGNOSIS — F489 Nonpsychotic mental disorder, unspecified: Secondary | ICD-10-CM

## 2012-10-10 DIAGNOSIS — H1030 Unspecified acute conjunctivitis, unspecified eye: Secondary | ICD-10-CM | POA: Diagnosis not present

## 2012-10-10 DIAGNOSIS — F419 Anxiety disorder, unspecified: Secondary | ICD-10-CM

## 2012-10-10 DIAGNOSIS — R519 Headache, unspecified: Secondary | ICD-10-CM

## 2012-10-10 DIAGNOSIS — G4733 Obstructive sleep apnea (adult) (pediatric): Secondary | ICD-10-CM

## 2012-10-10 DIAGNOSIS — R197 Diarrhea, unspecified: Secondary | ICD-10-CM | POA: Diagnosis not present

## 2012-10-10 DIAGNOSIS — R51 Headache: Secondary | ICD-10-CM

## 2012-10-10 MED ORDER — LOSARTAN POTASSIUM 50 MG PO TABS: 100.0000 mg | ORAL_TABLET | Freq: Every day | ORAL | Status: DC

## 2012-10-10 NOTE — Patient Instructions (Addendum)
Change the losartan to 50 mg at bedtime and continue  hydralazine at bedtime as well  Morning: bystolic 5 or 10 mg  And hydralazine 25 mg   Afternoon:  Hydralazine 25 mg   You can add 50 mg losartan in the morning after a week for persistent bp > 150/80  Take the valium at 9:00 PM and the requip at 10:00 PM.  The Lunesta if still awake at 11:00
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
## 2012-10-10 NOTE — Assessment & Plan Note (Addendum)
Likely multifactorial due to anxiety, medications, and untreated sleep apnea. Her blood pressures remain elevated in the morning when she wakes up. We will change the losartan to evening dosing. We'll also add a morning dose if her blood pressures remain elevated.

## 2012-10-12 ENCOUNTER — Encounter: Payer: Self-pay | Admitting: Internal Medicine

## 2012-10-12 NOTE — Assessment & Plan Note (Signed)
She was evaluated recently by rheumatology per my request due to persistent headaches concurrent anemia and abnormal CT angiography with normal temporal artery biopsy. there were no signs of vasculitis.

## 2012-10-12 NOTE — Assessment & Plan Note (Signed)
Complicated by neuropathy, restless leg syndrome and sleep apnea. I recommended that she stagger her medications for sleep in order to layer  them. Directions given.

## 2012-10-12 NOTE — Assessment & Plan Note (Signed)
New CPAP machine has been ordered from recent study and she has been using it for approximately 3 weeks. We are  awaiting results of the self reporting auto titration machine.

## 2012-10-12 NOTE — Assessment & Plan Note (Addendum)
Well-controlled on Januvia and twice daily 70/30 insulin.  Her last hemoglobin A1c was  7.0. In January Repeat A1c will be when she returns to the [place] for repeat hemoglobin. No changes made today. She is up-to-date on eye exams. She has chronic neuropathy and does not walk barefoot or have any history of ulcers. Foot exam showed skin to be in good condition today. She is on appropriate medications.

## 2012-10-20 LAB — HM DIABETES EYE EXAM

## 2012-10-28 ENCOUNTER — Ambulatory Visit: Payer: Self-pay | Admitting: Oncology

## 2012-10-28 ENCOUNTER — Telehealth: Payer: Self-pay | Admitting: Internal Medicine

## 2012-10-28 DIAGNOSIS — K219 Gastro-esophageal reflux disease without esophagitis: Secondary | ICD-10-CM | POA: Diagnosis not present

## 2012-10-28 DIAGNOSIS — D509 Iron deficiency anemia, unspecified: Secondary | ICD-10-CM | POA: Diagnosis not present

## 2012-10-28 DIAGNOSIS — R197 Diarrhea, unspecified: Secondary | ICD-10-CM | POA: Diagnosis not present

## 2012-10-28 DIAGNOSIS — Z794 Long term (current) use of insulin: Secondary | ICD-10-CM | POA: Diagnosis not present

## 2012-10-28 DIAGNOSIS — Z9089 Acquired absence of other organs: Secondary | ICD-10-CM | POA: Diagnosis not present

## 2012-10-28 DIAGNOSIS — F329 Major depressive disorder, single episode, unspecified: Secondary | ICD-10-CM | POA: Diagnosis not present

## 2012-10-28 DIAGNOSIS — E119 Type 2 diabetes mellitus without complications: Secondary | ICD-10-CM | POA: Diagnosis not present

## 2012-10-28 DIAGNOSIS — R5381 Other malaise: Secondary | ICD-10-CM | POA: Diagnosis not present

## 2012-10-28 DIAGNOSIS — K589 Irritable bowel syndrome without diarrhea: Secondary | ICD-10-CM | POA: Diagnosis not present

## 2012-10-28 DIAGNOSIS — Z9884 Bariatric surgery status: Secondary | ICD-10-CM | POA: Diagnosis not present

## 2012-10-28 DIAGNOSIS — R5383 Other fatigue: Secondary | ICD-10-CM | POA: Diagnosis not present

## 2012-10-28 DIAGNOSIS — Z79899 Other long term (current) drug therapy: Secondary | ICD-10-CM | POA: Diagnosis not present

## 2012-10-28 DIAGNOSIS — F3289 Other specified depressive episodes: Secondary | ICD-10-CM | POA: Diagnosis not present

## 2012-10-28 NOTE — Telephone Encounter (Signed)
Left message for pt to call office.  Please confirm pt dob.  We have 20[birth year]/28 and [employer] has [birth year]  Which is correct  Please let robin know thanks

## 2012-10-29 NOTE — Telephone Encounter (Signed)
Patient confirmed her DOB is [birth year].

## 2012-10-30 DIAGNOSIS — D509 Iron deficiency anemia, unspecified: Secondary | ICD-10-CM | POA: Diagnosis not present

## 2012-10-30 LAB — COMPREHENSIVE METABOLIC PANEL
Albumin: 3 g/dL — ABNORMAL LOW (ref 3.4–5.0)
Alkaline Phosphatase: 125 U/L (ref 50–136)
Anion Gap: 11 (ref 7–16)
BUN: 9 mg/dL (ref 7–18)
Bilirubin,Total: 0.2 mg/dL (ref 0.2–1.0)
Calcium, Total: 8.5 mg/dL (ref 8.5–10.1)
Chloride: 103 mmol/L (ref 98–107)
Co2: 29 mmol/L (ref 21–32)
Creatinine: 0.97 mg/dL (ref 0.60–1.30)
EGFR (African American): >60
EGFR (Non-African Amer.): >60
Glucose: 121 mg/dL — ABNORMAL HIGH (ref 65–99)
Osmolality: 285 (ref 275–301)
Potassium: 3 mmol/L — ABNORMAL LOW (ref 3.5–5.1)
SGOT(AST): 16 U/L (ref 15–37)
SGPT (ALT): 19 U/L (ref 12–78)
Sodium: 143 mmol/L (ref 136–145)
Total Protein: 7.3 g/dL (ref 6.4–8.2)

## 2012-10-30 LAB — FOLATE: Folic Acid: 7.4 ng/mL (ref 3.1–100.0)

## 2012-10-30 LAB — CBC CANCER CENTER
Basophil #: 0.1 x10 3/mm (ref 0.0–0.1)
Basophil %: 0.5 %
Eosinophil #: 0.2 x10 3/mm (ref 0.0–0.7)
Eosinophil %: 1.8 %
HCT: 37.5 % (ref 35.0–47.0)
HGB: 12.1 g/dL (ref 12.0–16.0)
Lymphocyte #: 2.1 x10 3/mm (ref 1.0–3.6)
Lymphocyte %: 20.8 %
MCH: 27.3 pg (ref 26.0–34.0)
MCHC: 32.3 g/dL (ref 32.0–36.0)
MCV: 85 fL (ref 80–100)
Monocyte #: 0.5 x10 3/mm (ref 0.2–0.9)
Monocyte %: 4.8 %
Neutrophil #: 7.3 x10 3/mm — ABNORMAL HIGH (ref 1.4–6.5)
Neutrophil %: 72.1 %
Platelet: 259 x10 3/mm (ref 150–440)
RBC: 4.44 10*6/uL (ref 3.80–5.20)
RDW: 19.6 % — ABNORMAL HIGH (ref 11.5–14.5)
WBC: 10.1 x10 3/mm (ref 3.6–11.0)

## 2012-10-30 LAB — IRON AND TIBC
Iron Bind.Cap.(Total): 312 ug/dL (ref 250–450)
Iron Saturation: 11 %
Iron: 33 ug/dL — ABNORMAL LOW (ref 50–170)
Unbound Iron-Bind.Cap.: 279 ug/dL

## 2012-10-30 LAB — FERRITIN: Ferritin (ARMC): 36 ng/mL (ref 8–388)

## 2012-10-30 LAB — LACTATE DEHYDROGENASE: LDH: 197 U/L (ref 81–246)

## 2012-10-30 LAB — HEMOGLOBIN A1C: Hemoglobin A1C: 6.3 % (ref 4.2–6.3)

## 2012-11-03 LAB — PROT IMMUNOELECTROPHORES(ARMC)

## 2012-11-13 ENCOUNTER — Ambulatory Visit: Payer: Self-pay | Admitting: Oncology

## 2012-11-17 DIAGNOSIS — R109 Unspecified abdominal pain: Secondary | ICD-10-CM | POA: Diagnosis not present

## 2012-11-17 DIAGNOSIS — R197 Diarrhea, unspecified: Secondary | ICD-10-CM | POA: Diagnosis not present

## 2012-11-17 DIAGNOSIS — K219 Gastro-esophageal reflux disease without esophagitis: Secondary | ICD-10-CM | POA: Diagnosis not present

## 2012-11-20 DIAGNOSIS — H1045 Other chronic allergic conjunctivitis: Secondary | ICD-10-CM | POA: Diagnosis not present

## 2012-12-15 DIAGNOSIS — B351 Tinea unguium: Secondary | ICD-10-CM | POA: Diagnosis not present

## 2012-12-15 DIAGNOSIS — M79609 Pain in unspecified limb: Secondary | ICD-10-CM | POA: Diagnosis not present

## 2012-12-16 DIAGNOSIS — H6990 Unspecified Eustachian tube disorder, unspecified ear: Secondary | ICD-10-CM | POA: Diagnosis not present

## 2012-12-16 DIAGNOSIS — H7290 Unspecified perforation of tympanic membrane, unspecified ear: Secondary | ICD-10-CM | POA: Diagnosis not present

## 2012-12-16 DIAGNOSIS — H6980 Other specified disorders of Eustachian tube, unspecified ear: Secondary | ICD-10-CM | POA: Diagnosis not present

## 2012-12-18 ENCOUNTER — Telehealth: Payer: Self-pay

## 2012-12-18 MED ORDER — TIZANIDINE HCL 2 MG PO CAPS: 2.0000 mg | ORAL_CAPSULE | Freq: Three times a day (TID) | ORAL | Status: DC | PRN

## 2012-12-18 NOTE — Telephone Encounter (Signed)
I'll write a prescription for the tizanidine.

## 2012-12-18 NOTE — Telephone Encounter (Signed)
Patient called and said she had a old bottle of Tiaznidine 2mg  from 2011 that she just finished using.  (This was removed from med list in [place] in 2011).  She would like to know if this med could be prescribed again.  Says she has an appt in Sept and can wait until then if needed.  If prescribed, she would [facility] and an additional written Rx sent to her so she can take it to [facility] for processing.  Please advise.  Thank you.

## 2012-12-29 ENCOUNTER — Telehealth: Payer: Self-pay | Admitting: Internal Medicine

## 2012-12-29 DIAGNOSIS — F419 Anxiety disorder, unspecified: Secondary | ICD-10-CM

## 2012-12-29 DIAGNOSIS — F5105 Insomnia due to other mental disorder: Secondary | ICD-10-CM

## 2012-12-29 MED ORDER — ESZOPICLONE 2 MG PO TABS: 2.0000 mg | ORAL_TABLET | Freq: Every day | ORAL | Status: DC

## 2012-12-29 NOTE — Telephone Encounter (Signed)
Pt would like for you to send a 30 day supply to [facility] [address] Please advise when you call this in

## 2012-12-29 NOTE — Telephone Encounter (Signed)
Ok to refill,  Refill sent  

## 2012-12-29 NOTE — Telephone Encounter (Signed)
Denise Sharp,  Please look in drug closet to see if we have any samples of Lunesta  2 or 3 mg to give ms Gaddie and let her know.  thanks

## 2012-12-29 NOTE — Telephone Encounter (Signed)
Do we have any samples of Lunesta. Pt is going on vacation and the meds will not arrive before her vacation starts. Please advise.

## 2012-12-29 NOTE — Telephone Encounter (Signed)
We do not have any samples 

## 2013-01-06 ENCOUNTER — Ambulatory Visit: Payer: Medicare Other | Admitting: Internal Medicine

## 2013-01-19 ENCOUNTER — Ambulatory Visit (INDEPENDENT_AMBULATORY_CARE_PROVIDER_SITE_OTHER): Payer: Medicare Other | Admitting: Internal Medicine

## 2013-01-19 VITALS — BP 102/58 | HR 83 | Temp 98.0°F | Resp 16 | Wt 225.5 lb

## 2013-01-19 DIAGNOSIS — I10 Essential (primary) hypertension: Secondary | ICD-10-CM

## 2013-01-19 DIAGNOSIS — R5383 Other fatigue: Secondary | ICD-10-CM

## 2013-01-19 DIAGNOSIS — F489 Nonpsychotic mental disorder, unspecified: Secondary | ICD-10-CM

## 2013-01-19 DIAGNOSIS — E08610 Diabetes mellitus due to underlying condition with diabetic neuropathic arthropathy: Secondary | ICD-10-CM

## 2013-01-19 DIAGNOSIS — F5105 Insomnia due to other mental disorder: Secondary | ICD-10-CM

## 2013-01-19 DIAGNOSIS — R5381 Other malaise: Secondary | ICD-10-CM

## 2013-01-19 DIAGNOSIS — D509 Iron deficiency anemia, unspecified: Secondary | ICD-10-CM

## 2013-01-19 DIAGNOSIS — R1110 Vomiting, unspecified: Secondary | ICD-10-CM | POA: Diagnosis not present

## 2013-01-19 DIAGNOSIS — R51 Headache: Secondary | ICD-10-CM

## 2013-01-19 DIAGNOSIS — F419 Anxiety disorder, unspecified: Secondary | ICD-10-CM

## 2013-01-19 DIAGNOSIS — F411 Generalized anxiety disorder: Secondary | ICD-10-CM | POA: Diagnosis not present

## 2013-01-19 DIAGNOSIS — E1149 Type 2 diabetes mellitus with other diabetic neurological complication: Secondary | ICD-10-CM

## 2013-01-19 DIAGNOSIS — R519 Headache, unspecified: Secondary | ICD-10-CM

## 2013-01-19 DIAGNOSIS — E1349 Other specified diabetes mellitus with other diabetic neurological complication: Secondary | ICD-10-CM

## 2013-01-19 LAB — HM DIABETES FOOT EXAM: HM Diabetic Foot Exam: ABNORMAL

## 2013-01-19 LAB — PREALBUMIN: Prealbumin: 20.2 mg/dL (ref 17.0–34.0)

## 2013-01-19 MED ORDER — TETANUS-DIPHTH-ACELL PERTUSSIS 5-2.5-18.5 LF-MCG/0.5 IM SUSP: 0.5000 mL | Freq: Once | INTRAMUSCULAR | Status: DC

## 2013-01-19 MED ORDER — ZOSTER VACCINE LIVE 19400 UNT/0.65ML ~~LOC~~ SOLR: 0.6500 mL | Freq: Once | SUBCUTANEOUS | Status: DC

## 2013-01-19 MED ORDER — CYANOCOBALAMIN 1000 MCG/ML IJ SOLN: INTRAMUSCULAR | Status: DC

## 2013-01-19 MED ORDER — ESZOPICLONE 2 MG PO TABS: 2.0000 mg | ORAL_TABLET | Freq: Every day | ORAL | Status: DC

## 2013-01-19 NOTE — Progress Notes (Signed)
Patient ID: Denise Sharp, female   DOB: 07-18-1945, 67 y.o.   MRN: [id number]   Patient Active Problem List   Diagnosis Date Noted  . Postprandial vomiting 01/20/2013  . Hypomagnesemia 08/06/2012  . Anemia 08/06/2012  . Iron deficiency anemia 08/06/2012  . OSA on CPAP 03/02/2012  . Esophageal stenosis   . Insomnia secondary to anxiety 02/29/2012  . Headache, temporal 01/07/2012  . Bundle branch block, left 01/07/2012  . Sciatica 09/05/2011  . Cervical stenosis of spinal canal 06/14/2011  . Adrenal mass 03/13/2011  . Irritable bowel syndrome 03/13/2011  . Restless legs syndrome 03/13/2011  . Degenerative disk disease 03/13/2011  . Hypersomnolence 03/12/2011  . Hypertension 03/12/2011  . 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 Hearing loss, right 03/12/2011    Subjective:  CC:   Chief Complaint  Patient presents with  . Follow-up    For A1c and hemoglobin / patient stated felt heart racing checked 134 per min.    HPI:   Denise Sharp a 67 y.o. female who presents Follow up on multiple chronic conditions including  DM with peripheral  neuropathy and obesity, chronic headaches and insomnia, back pain, and persistent post prandial emesis with history of 2 prior bariatric surgeries. . After her first gastric bypass she reached a nadir of 169 lbs, but for unclear reasons underwent a second procedure which resulted in a weight gain of 50 lbs.  She reports post prandial emesis after evdery meal without weight loss.   She has an appt with a bariatric surgeon tomorrow to consider ruling out a blockage as a cuase for her recurrent emesis   left shoulder pain. [place],\ sent her to PT for rotator cuff arthropathy PT,  PT disagreed,  And therapy hasn ot helped .  Her pain is so significant that at times she cannot raise arm   Due to pain in the  rotator cuff area that radiates to elbow ,     Anemia of chronic disease.  She  has received iron infusions at the cancer center in the past and is due to repeat Labs which need to go to [facility]    Past Medical History  Diagnosis Date  . Diabetes mellitus   . Depression   . Hypertension   . Cervical spinal stenosis 1994    due to trauma to back ([employer] accident), has intermittent paralysis and parasthesias  . Adrenal mass   . IBS (irritable bowel syndrome)   . Obstructive sleep apnea     using CPAP  . Anemia     iron deficient post  2 unit txfsn 2009, normal endo/colonoscopy by [facility]  . Esophageal stenosis March 2011    with transietn outlet obstruction by food, cleared by EGD   . LBBB (left bundle branch block)   . Obesity   . Apnea   . Headache(784.0)   . Arthritis   . Restless leg syndrome   . Dizziness     chronic dizziness  . Adrenal mass, left     Past Surgical History  Procedure Laterality Date  . Gastric bypass    . Abdominal hysterectomy    . Gastric bypass  2000, 2005    Dr. Debroah Loop  . Appendectomy    . Gallbladder surgery  1976  . Rotator cuff repair      right  . Joint replacement  2007    bilateral knee. [name],  [name]  . Spine surgery  1995  [place]  . Interstim implant placement  April 2013    Cope  . Gallbladder surgery  resection       The following portions of the patient's history were reviewed and updated as appropriate: Allergies, current medications, and problem list.    Review of Systems:   Patient denies headache, fevers, malaise, unintentional weight loss, skin rash, eye pain, sinus congestion and sinus pain, sore throat, dysphagia,  hemoptysis , cough, dyspnea, wheezing, chest pain,, orthopnea, edema, abdominal pain,  melena, diarrhea, constipation, flank pain, dysuria, hematuria, urinary  Frequency, nocturia, numbness, tingling, seizures,  Focal weakness, Loss of consciousness,  Tremor, insomnia, depression, anxiety, and suicidal ideation.      History   Social History  . Marital Status: Married     Spouse Name: N/A    Number of Children: N/A  . Years of Education: N/A   Occupational History  . Not on file.   Social History Main Topics  . Smoking status: Never Smoker   . Smokeless tobacco: Never Used  . Alcohol Use: No  . Drug Use: No  . Sexually Active: Not on file   Other Topics Concern  . Not on file   Social History Narrative  . No narrative on file    Objective:  BP 102/58  Pulse 83  Temp(Src) 98 F (36.7 C) (Oral)  Resp 16  Wt 225 lb 8 oz (102.286 kg)  BMI 38.69 kg/m2  SpO2 98%  General appearance: alert, cooperative and appears stated age Ears: normal TM's and external ear canals both ears Throat: lips, mucosa, and tongue normal; teeth and gums normal Neck: no adenopathy, no carotid bruit, supple, symmetrical, trachea midline and thyroid not enlarged, symmetric, no tenderness/mass/nodules Back: symmetric, no curvature. ROM normal. No CVA tenderness. Lungs: clear to auscultation bilaterally Heart: regular rate and rhythm, S1, S2 normal, no murmur, click, rub or gallop Abdomen: soft, non-tender; bowel sounds normal; no masses,  no organomegaly Pulses: 2+ and symmetric Skin: Skin color, texture, turgor normal. No rashes or lesions Lymph nodes: Cervical, supraclavicular, and axillary nodes normal. Foot exam:  Nails are well trimmed,  No callouses,  Sensation lost bilaterally to mid tibia.  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  hemoglobin A1c has been consistently at or  less than 7.0 . She is up-to-date on eye exams.She is on the appropriate medications.Foot exam done,  Has No feeling to mid tibia,  Missing a toe on the left foot.  Foot exam every 3 months by regal . Due for a1c .  Was not done at Cancer center in April despite request. Last known a1c was 7.0 in January.    Headache, temporal Improved  while on vacation in [place],  suggesting stress related.  Prior workup for vasculitis negative.   Hypertension improved conroll with reduction in stressors.  No changes today   Iron deficiency anemia Repeat hgb due,  Was 12 in April   Postprandial vomiting Recurrent,  Occurring every meal , Without weight loss.  Prealbumin pending.  Bariatric evaluation pending.   A total of 40 minutes was spent with patient more than half of which was spent in counseling, reviewing records from other prviders and coordination of care.   Updated Medication List Outpatient Encounter Prescriptions as of 01/19/2013  Medication Sig Dispense Refill  . cyanocobalamin (,VITAMIN B-12,) 1000 MCG/ML injection 1000 ml (1 ml) injection  twice monthly  25 mL  3  . diazepam (VALIUM) 10 MG tablet Take 1 tablet (10 mg total) by mouth at bedtime as needed for anxiety.  30 tablet  3  . estradiol (ESTRACE) 0.5 MG tablet Take 1 tablet (0.5 mg total) by mouth daily.  90 tablet  2  . eszopiclone (LUNESTA) 2 MG TABS Take 1 tablet (2 mg total) by mouth at bedtime. Take immediately before bedtime  90 tablet  3  . Glucose Blood (BAYER BREEZE 2 TEST) DISK Use one test strip each time glucose needs testing.  50 each  2  . hydrALAZINE (APRESOLINE) 25 MG tablet Take 1 tablet (25 mg total) by mouth 3 (three) times daily. As needed for bp > 160  90 tablet  2  . hydrocodone-acetaminophen (LORCET-HD) 5-500 MG per capsule Take 1 capsule by mouth every 6 (six) hours as needed.  30 capsule  3  . Insulin Aspart Prot & Aspart (NOVOLOG MIX 70/30 FLEXPEN [place]) Inject into the skin as needed. Sliding scale      . Loperamide HCl (IMODIUM PO) Take by mouth.        . losartan (COZAAR) 50 MG tablet Take 2 tablets (100 mg total) by mouth daily.  180 tablet  0  . magnesium oxide (MAG-OX 400) 400 MG tablet Take 1 tablet (400 mg total) by mouth daily.  90 tablet  2  . nebivolol (BYSTOLIC) 5 MG tablet Take 1 tablet (5 mg total) by mouth daily.  90 tablet  2   . oxyCODONE-acetaminophen (PERCOCET/ROXICET) 5-325 MG per tablet Take 1 tablet by mouth every 6 (six) hours as needed.  30 tablet  0  . pantoprazole (PROTONIX) 40 MG tablet Take 1 tablet (40 mg total) by mouth daily.  90 tablet  2  . pregabalin (LYRICA) 50 MG capsule Take 1 capsule (50 mg total) by mouth 3 (three) times daily.  270 capsule  2  . rOPINIRole (REQUIP) 1 MG tablet Take 1 tablet (1 mg total) by mouth at bedtime.  90 tablet  2  . sitaGLIPtin (JANUVIA) 100 MG tablet Take 1 tablet (100 mg total) by mouth daily.  90 tablet  2  . tizanidine (ZANAFLEX) 2 MG capsule Take 1 capsule (2 mg total) by mouth 3 (three) times daily as needed for muscle spasms.  30 capsule  1  . traMADol (ULTRAM) 50 MG tablet Take 1 tablet (50 mg total) by mouth 4 (four) times daily.  360 tablet  2  . [DISCONTINUED] cyanocobalamin (,VITAMIN B-12,) 1000 MCG/ML injection Takes 2 injections twice a month.      . [DISCONTINUED] eszopiclone (LUNESTA) 2 MG TABS Take 1 tablet (2 mg total) by mouth at bedtime. Take immediately before bedtime  30 tablet  0  . ferrous sulfate 324 (65 FE) MG TBEC Take 1 tablet (325 mg total) by mouth daily with lunch.  30 tablet  11  . TDaP (BOOSTRIX) 5-2.5-18.5 LF-MCG/0.5 injection Inject 0.5 mLs into the muscle once.  0.5 mL  0  . zoster vaccine live, PF, (ZOSTAVAX) 16109 UNT/0.65ML injection Inject 19,400 Units into the skin once.  1 each  0   No facility-administered encounter medications on file as of 01/19/2013.     Orders Placed This Encounter  Procedures  . Hemoglobin A1c  . Comprehensive metabolic panel  . TSH  . Prealbumin  . CBC with Differential  . HM DIABETES EYE EXAM  . HM DIABETES FOOT EXAM    No Follow-up on file.

## 2013-01-19 NOTE — Patient Instructions (Addendum)
We will schedule your mammogram soon.   You need to have a TDaP vaccine and a Shingles vaccine.  I have given you prescriptions for these because they will be cheaper at the health Dept or at your  local pharmacy because Medicare will not reimburse for them.   We will contact you with the bloodwork results

## 2013-01-20 ENCOUNTER — Encounter: Payer: Self-pay | Admitting: Internal Medicine

## 2013-01-20 DIAGNOSIS — Z9884 Bariatric surgery status: Secondary | ICD-10-CM | POA: Diagnosis not present

## 2013-01-20 DIAGNOSIS — R1110 Vomiting, unspecified: Secondary | ICD-10-CM | POA: Insufficient documentation

## 2013-01-20 LAB — CBC WITH DIFFERENTIAL/PLATELET
Basophils Absolute: 0 10*3/uL (ref 0.0–0.1)
Basophils Relative: 0.3 % (ref 0.0–3.0)
Eosinophils Absolute: 0.1 10*3/uL (ref 0.0–0.7)
Eosinophils Relative: 0.8 % (ref 0.0–5.0)
HCT: 37.6 % (ref 36.0–46.0)
Hemoglobin: 12.6 g/dL (ref 12.0–15.0)
Lymphocytes Relative: 23.7 % (ref 12.0–46.0)
Lymphs Abs: 2.7 10*3/uL (ref 0.7–4.0)
MCHC: 33.5 g/dL (ref 30.0–36.0)
MCV: 87.8 fl (ref 78.0–100.0)
Monocytes Absolute: 0.5 10*3/uL (ref 0.1–1.0)
Monocytes Relative: 4.5 % (ref 3.0–12.0)
Neutro Abs: 8 10*3/uL — ABNORMAL HIGH (ref 1.4–7.7)
Neutrophils Relative %: 70.7 % (ref 43.0–77.0)
Platelets: 310 10*3/uL (ref 150.0–400.0)
RBC: 4.28 Mil/uL (ref 3.87–5.11)
RDW: 16.4 % — ABNORMAL HIGH (ref 11.5–14.6)
WBC: 11.3 10*3/uL — ABNORMAL HIGH (ref 4.5–10.5)

## 2013-01-20 LAB — COMPREHENSIVE METABOLIC PANEL
ALT: 12 U/L (ref 0–35)
AST: 17 U/L (ref 0–37)
Albumin: 3.7 g/dL (ref 3.5–5.2)
Alkaline Phosphatase: 103 U/L (ref 39–117)
BUN: 14 mg/dL (ref 6–23)
CO2: 26 mEq/L (ref 19–32)
Calcium: 8.6 mg/dL (ref 8.4–10.5)
Chloride: 110 mEq/L (ref 96–112)
Creatinine, Ser: 0.8 mg/dL (ref 0.4–1.2)
GFR: 71.81 mL/min (ref >60.00)
Glucose, Bld: 76 mg/dL (ref 70–99)
Potassium: 3.9 mEq/L (ref 3.5–5.1)
Sodium: 141 mEq/L (ref 135–145)
Total Bilirubin: 0.2 mg/dL — ABNORMAL LOW (ref 0.3–1.2)
Total Protein: 7.6 g/dL (ref 6.0–8.3)

## 2013-01-20 LAB — HEMOGLOBIN A1C: Hgb A1c MFr Bld: 7.1 % — ABNORMAL HIGH (ref 4.6–6.5)

## 2013-01-20 LAB — TSH: TSH: 3.95 u[IU]/mL (ref 0.35–5.50)

## 2013-01-20 NOTE — Assessment & Plan Note (Signed)
Repeat hgb due,  Was 12 in April

## 2013-01-20 NOTE — Assessment & Plan Note (Signed)
Improved while on vacation in [place],  suggesting stress related.  Prior workup for vasculitis negative.

## 2013-01-20 NOTE — Assessment & Plan Note (Addendum)
Blood sugars are well-controlled on current medications.  hemoglobin A1c has been consistently at or  less than 7.0 . She is up-to-date on eye exams.She is on the appropriate medications.Foot exam done,  Has No feeling to mid tibia,  Missing a toe on the left foot.  Foot exam every 3 months by regal . Due for a1c .  Was not done at Cancer center in April despite request. Last known a1c was 7.0 in January.

## 2013-01-20 NOTE — Assessment & Plan Note (Signed)
improved conroll with reduction in stressors.  No changes today

## 2013-01-20 NOTE — Assessment & Plan Note (Signed)
Recurrent,  Occurring every meal , Without weight loss.  Prealbumin pending.  Bariatric evaluation pending.

## 2013-01-21 ENCOUNTER — Encounter: Payer: Self-pay | Admitting: Internal Medicine

## 2013-01-22 ENCOUNTER — Telehealth: Payer: Self-pay | Admitting: Internal Medicine

## 2013-01-22 NOTE — Telephone Encounter (Signed)
Disregarding message I will fax over notes since results are in

## 2013-01-22 NOTE — Telephone Encounter (Signed)
Pt recently had labs done and was wondering if we could send results to Dr. Kreg Shropshire her Bariatric specialist of [place]. Fax: [phone]

## 2013-01-23 DIAGNOSIS — E119 Type 2 diabetes mellitus without complications: Secondary | ICD-10-CM | POA: Diagnosis not present

## 2013-01-23 DIAGNOSIS — R627 Adult failure to thrive: Secondary | ICD-10-CM | POA: Diagnosis not present

## 2013-01-23 DIAGNOSIS — Z9884 Bariatric surgery status: Secondary | ICD-10-CM | POA: Diagnosis not present

## 2013-01-23 DIAGNOSIS — D649 Anemia, unspecified: Secondary | ICD-10-CM | POA: Diagnosis not present

## 2013-01-23 DIAGNOSIS — I10 Essential (primary) hypertension: Secondary | ICD-10-CM | POA: Diagnosis not present

## 2013-01-23 DIAGNOSIS — R0989 Other specified symptoms and signs involving the circulatory and respiratory systems: Secondary | ICD-10-CM | POA: Diagnosis not present

## 2013-01-23 DIAGNOSIS — R635 Abnormal weight gain: Secondary | ICD-10-CM | POA: Diagnosis not present

## 2013-01-23 DIAGNOSIS — E559 Vitamin D deficiency, unspecified: Secondary | ICD-10-CM | POA: Diagnosis not present

## 2013-01-23 NOTE — Telephone Encounter (Signed)
Mailed unread message to pt  

## 2013-01-26 DIAGNOSIS — S43429A Sprain of unspecified rotator cuff capsule, initial encounter: Secondary | ICD-10-CM | POA: Diagnosis not present

## 2013-01-27 ENCOUNTER — Ambulatory Visit: Payer: Self-pay | Admitting: Oncology

## 2013-01-29 DIAGNOSIS — H6980 Other specified disorders of Eustachian tube, unspecified ear: Secondary | ICD-10-CM | POA: Diagnosis not present

## 2013-01-29 DIAGNOSIS — H7290 Unspecified perforation of tympanic membrane, unspecified ear: Secondary | ICD-10-CM | POA: Diagnosis not present

## 2013-01-29 DIAGNOSIS — H6990 Unspecified Eustachian tube disorder, unspecified ear: Secondary | ICD-10-CM | POA: Diagnosis not present

## 2013-01-31 ENCOUNTER — Other Ambulatory Visit: Payer: Self-pay | Admitting: Internal Medicine

## 2013-02-02 NOTE — Telephone Encounter (Signed)
Eprescribed.

## 2013-02-12 ENCOUNTER — Telehealth: Payer: Self-pay | Admitting: Internal Medicine

## 2013-02-12 NOTE — Telephone Encounter (Signed)
Angry letter to medicare  drafted and on pritner.

## 2013-02-12 NOTE — Telephone Encounter (Signed)
Pt states she received a letter from her pharmacy that Medicare will not cover her diabetic supplies unless she provides them with a 4 wk record of blood sugar testing and a letter from [name] that says how many times per day she is to check her blood sugars.  States she usually checks once a day in the mornings.  Asking if we can fax or mail her the letter.  Fax# is the same as home#, but would have to call before faxing so she knows not to answer the phone, or mail to her.  May call pt on home or cell phone with any questions.

## 2013-02-13 NOTE — Telephone Encounter (Signed)
Mailed letter to patient for medicare.

## 2013-02-18 ENCOUNTER — Other Ambulatory Visit: Payer: Self-pay

## 2013-02-18 DIAGNOSIS — Z9884 Bariatric surgery status: Secondary | ICD-10-CM | POA: Diagnosis not present

## 2013-02-27 ENCOUNTER — Ambulatory Visit: Payer: Self-pay | Admitting: Specialist

## 2013-02-27 DIAGNOSIS — K429 Umbilical hernia without obstruction or gangrene: Secondary | ICD-10-CM | POA: Diagnosis not present

## 2013-02-27 DIAGNOSIS — R918 Other nonspecific abnormal finding of lung field: Secondary | ICD-10-CM | POA: Diagnosis not present

## 2013-02-27 DIAGNOSIS — K449 Diaphragmatic hernia without obstruction or gangrene: Secondary | ICD-10-CM | POA: Diagnosis not present

## 2013-02-27 DIAGNOSIS — E278 Other specified disorders of adrenal gland: Secondary | ICD-10-CM | POA: Diagnosis not present

## 2013-03-03 DIAGNOSIS — Z9884 Bariatric surgery status: Secondary | ICD-10-CM | POA: Diagnosis not present

## 2013-03-04 DIAGNOSIS — S43429A Sprain of unspecified rotator cuff capsule, initial encounter: Secondary | ICD-10-CM | POA: Diagnosis not present

## 2013-03-05 DIAGNOSIS — I10 Essential (primary) hypertension: Secondary | ICD-10-CM | POA: Diagnosis not present

## 2013-03-05 DIAGNOSIS — E119 Type 2 diabetes mellitus without complications: Secondary | ICD-10-CM | POA: Diagnosis not present

## 2013-03-05 DIAGNOSIS — R1310 Dysphagia, unspecified: Secondary | ICD-10-CM | POA: Diagnosis not present

## 2013-03-05 DIAGNOSIS — J4489 Other specified chronic obstructive pulmonary disease: Secondary | ICD-10-CM | POA: Diagnosis not present

## 2013-03-05 DIAGNOSIS — G4733 Obstructive sleep apnea (adult) (pediatric): Secondary | ICD-10-CM | POA: Diagnosis not present

## 2013-03-05 DIAGNOSIS — I499 Cardiac arrhythmia, unspecified: Secondary | ICD-10-CM | POA: Diagnosis not present

## 2013-03-05 DIAGNOSIS — M109 Gout, unspecified: Secondary | ICD-10-CM | POA: Diagnosis not present

## 2013-03-05 DIAGNOSIS — J449 Chronic obstructive pulmonary disease, unspecified: Secondary | ICD-10-CM | POA: Diagnosis not present

## 2013-03-05 DIAGNOSIS — Z9884 Bariatric surgery status: Secondary | ICD-10-CM | POA: Diagnosis not present

## 2013-03-05 DIAGNOSIS — Z9089 Acquired absence of other organs: Secondary | ICD-10-CM | POA: Diagnosis not present

## 2013-03-05 DIAGNOSIS — Z79899 Other long term (current) drug therapy: Secondary | ICD-10-CM | POA: Diagnosis not present

## 2013-03-05 DIAGNOSIS — Z9889 Other specified postprocedural states: Secondary | ICD-10-CM | POA: Diagnosis not present

## 2013-03-05 DIAGNOSIS — D649 Anemia, unspecified: Secondary | ICD-10-CM | POA: Diagnosis not present

## 2013-03-05 DIAGNOSIS — Z90710 Acquired absence of both cervix and uterus: Secondary | ICD-10-CM | POA: Diagnosis not present

## 2013-03-05 DIAGNOSIS — R209 Unspecified disturbances of skin sensation: Secondary | ICD-10-CM | POA: Diagnosis not present

## 2013-03-05 DIAGNOSIS — IMO0001 Reserved for inherently not codable concepts without codable children: Secondary | ICD-10-CM | POA: Diagnosis not present

## 2013-03-05 DIAGNOSIS — K219 Gastro-esophageal reflux disease without esophagitis: Secondary | ICD-10-CM | POA: Diagnosis not present

## 2013-03-05 DIAGNOSIS — K449 Diaphragmatic hernia without obstruction or gangrene: Secondary | ICD-10-CM | POA: Diagnosis not present

## 2013-03-12 DIAGNOSIS — K449 Diaphragmatic hernia without obstruction or gangrene: Secondary | ICD-10-CM | POA: Diagnosis not present

## 2013-03-12 DIAGNOSIS — IMO0001 Reserved for inherently not codable concepts without codable children: Secondary | ICD-10-CM | POA: Diagnosis not present

## 2013-03-12 DIAGNOSIS — Z9884 Bariatric surgery status: Secondary | ICD-10-CM | POA: Diagnosis not present

## 2013-03-12 DIAGNOSIS — K219 Gastro-esophageal reflux disease without esophagitis: Secondary | ICD-10-CM | POA: Diagnosis not present

## 2013-03-12 DIAGNOSIS — R1310 Dysphagia, unspecified: Secondary | ICD-10-CM | POA: Diagnosis not present

## 2013-03-12 DIAGNOSIS — I10 Essential (primary) hypertension: Secondary | ICD-10-CM | POA: Diagnosis not present

## 2013-03-12 DIAGNOSIS — M109 Gout, unspecified: Secondary | ICD-10-CM | POA: Diagnosis not present

## 2013-03-17 DIAGNOSIS — K449 Diaphragmatic hernia without obstruction or gangrene: Secondary | ICD-10-CM | POA: Diagnosis not present

## 2013-03-17 DIAGNOSIS — Z9884 Bariatric surgery status: Secondary | ICD-10-CM | POA: Diagnosis not present

## 2013-03-18 ENCOUNTER — Telehealth: Payer: Self-pay

## 2013-03-18 NOTE — Telephone Encounter (Signed)
I spoke with the pt and she insisted that she has seen Sr. Mcquaid and Dr. Gwenette Greet before but I do not see any record of this in her chart. She has seen [name] and she has ordered her CPAP. The pt staets her husband has seen Dr. Lake Bells and [facility], maybe she is getting confused. I advised the pt that she will be a new patient and she needs to keep appt with MW on Friday. Pt states understanding.[employer], CMA

## 2013-03-19 ENCOUNTER — Telehealth: Payer: Self-pay | Admitting: *Deleted

## 2013-03-19 NOTE — Telephone Encounter (Signed)
Patient to pick up samples until regular insulin can be ordered from [facility].

## 2013-03-20 ENCOUNTER — Ambulatory Visit (INDEPENDENT_AMBULATORY_CARE_PROVIDER_SITE_OTHER): Payer: Medicare Other | Admitting: Internal Medicine

## 2013-03-20 ENCOUNTER — Ambulatory Visit (INDEPENDENT_AMBULATORY_CARE_PROVIDER_SITE_OTHER)
Admission: RE | Admit: 2013-03-20 | Discharge: 2013-03-20 | Disposition: A | Discharge status: Home / Self Care | Payer: Medicare Other | Source: Ambulatory Visit | Attending: Internal Medicine | Admitting: Internal Medicine

## 2013-03-20 ENCOUNTER — Encounter: Payer: Self-pay | Admitting: Internal Medicine

## 2013-03-20 VITALS — BP 110/60 | HR 97 | Temp 97.0°F | Ht 64.5 in | Wt 225.0 lb

## 2013-03-20 DIAGNOSIS — R0602 Shortness of breath: Secondary | ICD-10-CM

## 2013-03-20 DIAGNOSIS — R0609 Other forms of dyspnea: Secondary | ICD-10-CM | POA: Insufficient documentation

## 2013-03-20 DIAGNOSIS — R918 Other nonspecific abnormal finding of lung field: Secondary | ICD-10-CM | POA: Diagnosis not present

## 2013-03-20 DIAGNOSIS — R0600 Dyspnea, unspecified: Secondary | ICD-10-CM | POA: Insufficient documentation

## 2013-03-20 NOTE — Assessment & Plan Note (Signed)
03/20/2013  Walked RA x 3 laps @ 185 ft each stopped due to end of study, mild sob   Symptoms are markedly disproportionate to objective findings and not clear this is a lung problem but pt does appear to have difficult airway management issues. DDX of  difficult airways managment all start with A and  include Adherence, Ace Inhibitors, Acid Reflux, Active Sinus Disease, Alpha 1 Antitripsin deficiency, Anxiety masquerading as Airways dz,  ABPA,  allergy(esp in young), Aspiration (esp in elderly), Adverse effects of DPI,  Active smokers, plus two Bs  = Bronchiectasis and Beta blocker use..and one C= CHF  Acid reflux and effects of obesity are the most concerning, esp if intermittently vomiting > proceed with gi w/u and Rx

## 2013-03-20 NOTE — Progress Notes (Signed)
Subjective:    Patient ID: Denise Sharp, female    DOB: 1945-09-07 MRN: [id number]  HPI  47 yowf never smoker referred to pulmonary clinic  03/20/2013 by Dr Darnell Level for abn CT chest.   03/20/2013 1st  Pulmonary office visit/ Denise Sharp  Chief Complaint  Patient presents with  . Pulmonary Consult    Referred per Dr Carmelina Noun for eval of pulmonary nodule. Pt c/o SOB and CP for the past 6-12 months. Pt states that she is SOB "most of the time" but worse with exertion and when she gets in a hurry. CP comes and goes and sometimes is sharp.   one aisle at grocery store gets exhausted some shortness and sometimes cp, and sometimes no cp which is just as common supine,  First noted with wt gain, better with wt loss, then worse x one year. Cp lasts 5-10 min eval at [facility] and nl heart cath March 2014 -? pain worse eating > ? Related to prev GI surgeries so w/u in progress - does have occ vomiting  No obvious daytime variabilty or assoc chronic cough or cp or chest tightness, subjective wheeze overt sinus or hb symptoms. No unusual exp hx or h/o childhood pna/ asthma or knowledge of premature birth.   Sleeping ok without nocturnal  or early am exacerbation  of respiratory  c/o's or need for noct saba. Also denies any obvious fluctuation of symptoms with weather or environmental changes or other aggravating or alleviating factors except as outlined above  Current Medications, Allergies, Past Medical History, Past Surgical History, Family History, and Social History were reviewed in [employer] record.       Review of Systems  Constitutional: Negative for fever, chills and unexpected weight change.  HENT: Positive for congestion, rhinorrhea and trouble swallowing. Negative for ear pain, nosebleeds, sore throat, sneezing, dental problem, voice change, postnasal drip and sinus pressure.   Eyes: Negative for visual disturbance.  Respiratory: Positive for shortness of  breath. Negative for cough and choking.   Cardiovascular: Positive for chest pain. Negative for leg swelling.  Gastrointestinal: Positive for abdominal pain. Negative for vomiting and diarrhea.  Genitourinary: Negative for difficulty urinating.  Musculoskeletal: Negative for arthralgias.  Skin: Negative for rash.  Neurological: Positive for headaches. Negative for tremors and syncope.  Hematological: Does not bruise/bleed easily.       Objective:   Physical Exam   amb wf nad  Wt Readings from Last 3 Encounters:  03/20/13 225 lb (102.059 kg)  01/19/13 225 lb 8 oz (102.286 kg)  10/10/12 228 lb 12 oz (103.76 kg)     HEENT: nl dentition, turbinates, and orophanx. Nl external ear canals without cough reflex   NECK :  without JVD/Nodes/TM/ nl carotid upstrokes bilaterally   LUNGS: no acc muscle use, clear to A and P bilaterally without cough on insp or exp maneuvers   CV:  RRR  no s3 or murmur or increase in P2, no edema   ABD:  soft and nontender with nl excursion in the supine position. No bruits or organomegaly, bowel sounds nl  MS:  warm without deformities, calf tenderness, cyanosis or clubbing  SKIN: warm and dry without lesions    NEURO:  alert, approp, no deficits    CT chest Feb 27 2013  1.1 cm gg changes LUL and lingula   CXR  03/20/2013 :  Lingular scarring or atelectasis. No visible nodules by plain film. If nodules were seen on outside CT, this should  be followed with CT.       Assessment & Plan:

## 2013-03-20 NOTE — Patient Instructions (Addendum)
Please remember to go to the x-ray department downstairs for your tests - we will call you with the results when they are available.    You will need a follow up CT scan in 6 months at [place] - my office will call you to be sure it gets scheduled  You are cleared for surgery from a pulmonary perspective

## 2013-03-20 NOTE — Assessment & Plan Note (Addendum)
-   See [facility] CT dated 02/27/13 RUL and lingular changes  -CXR 03/20/2013 > no macroscopic changes   Although there are clearly abnormalities on CT scan, they should probably be considered "microscopic" since not obvious on plain cxr .     In the setting of obvious "macroscopic" health issues,  I am very reluctatnt to embark on an invasive w/u at this point but will arrange consevative  follow up and in the meantime see what we can do to address the patient's subjective concerns.    Namely prob GI cp > rec proceed with GI w/u and surgery, the main concern being obesity related post op complications  Will defer to Dr Delorse Lek but given pt never smoked with a nl cxr I think a f/u CT s contrast at 6 months at [facility] is approp

## 2013-03-20 NOTE — Addendum Note (Signed)
Addended by: Christinia Gully B on: 03/20/2013 01:24 PM   Modules accepted: Level of Service

## 2013-03-20 NOTE — Progress Notes (Signed)
Quick Note:  Spoke with pt and notified of results per Dr. Wert. Pt verbalized understanding and denied any questions.  ______ 

## 2013-03-23 DIAGNOSIS — M79609 Pain in unspecified limb: Secondary | ICD-10-CM | POA: Diagnosis not present

## 2013-03-23 DIAGNOSIS — B351 Tinea unguium: Secondary | ICD-10-CM | POA: Diagnosis not present

## 2013-03-25 ENCOUNTER — Encounter: Payer: Self-pay | Admitting: Neurology

## 2013-03-25 ENCOUNTER — Ambulatory Visit (INDEPENDENT_AMBULATORY_CARE_PROVIDER_SITE_OTHER): Payer: Medicare Other | Admitting: Neurology

## 2013-03-25 VITALS — BP 124/78 | HR 64 | Wt 229.5 lb

## 2013-03-25 DIAGNOSIS — E1142 Type 2 diabetes mellitus with diabetic polyneuropathy: Secondary | ICD-10-CM

## 2013-03-25 DIAGNOSIS — R269 Unspecified abnormalities of gait and mobility: Secondary | ICD-10-CM

## 2013-03-25 DIAGNOSIS — R51 Headache: Secondary | ICD-10-CM

## 2013-03-25 DIAGNOSIS — G2581 Restless legs syndrome: Secondary | ICD-10-CM

## 2013-03-25 DIAGNOSIS — R519 Headache, unspecified: Secondary | ICD-10-CM

## 2013-03-25 HISTORY — DX: Type 2 diabetes mellitus with diabetic polyneuropathy: E11.42

## 2013-03-25 HISTORY — DX: Unspecified abnormalities of gait and mobility: R26.9

## 2013-03-25 MED ORDER — TIZANIDINE HCL 2 MG PO CAPS: 2.0000 mg | ORAL_CAPSULE | Freq: Three times a day (TID) | ORAL | Status: DC | PRN

## 2013-03-25 NOTE — Progress Notes (Signed)
Reason for visit: Peripheral neuropathy  Denise Sharp is an 67 y.o. female  History of present illness:  Denise Sharp is a 67 year old right-handed white female with a history of diabetes, neuropathy, cervicogenic headache, and obesity. The patient will be undergoing surgery in the near future for a hiatal hernia and an umbilical hernia. The patient gets some benefit with tizanidine with her headaches. The patient is on Ultram if needed. The patient returns for an evaluation. The patient is usually able to ambulate independently, occasionally she will use a scooter for long-distance travel. The patient reports no other new medical issues that have come up since last seen.  Past Medical History  Diagnosis Date  . Diabetes mellitus   . Depression   . Hypertension   . Cervical spinal stenosis 1994    due to trauma to back ([employer] accident), has intermittent paralysis and parasthesias  . Adrenal mass   . IBS (irritable bowel syndrome)   . Obstructive sleep apnea     using CPAP  . Anemia     iron deficient post  2 unit txfsn 2009, normal endo/colonoscopy by [facility]  . Esophageal stenosis March 2011    with transietn outlet obstruction by food, cleared by EGD   . LBBB (left bundle branch block)   . Obesity   . Apnea   . Headache(784.0)   . Arthritis   . Restless leg syndrome   . Dizziness     chronic dizziness  . Adrenal mass, left   . 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 Abnormality of gait 03/25/2013    Past Surgical History  Procedure Laterality Date  . Gastric bypass    . Abdominal hysterectomy    . Gastric bypass  2000, 2005    Dr. Debroah Loop  . Appendectomy    . Gallbladder surgery  1976  . Rotator cuff repair      right  . Joint replacement  2007    bilateral knee. [name],  [name]  . Spine surgery  1995    [place]  . Interstim implant placement  April 2013    [place]  . Gallbladder surgery  resection    Family History  Problem Relation Age of Onset   . Heart disease Father   . Hypertension Father   . Prostate cancer Father   . Stroke Mother   . Depression Mother   . Headache Mother     Social history:  reports that she has never smoked. She has never used smokeless tobacco. She reports that she does not drink alcohol or use illicit drugs.    Allergies  Allergen Reactions  . Diovan [Valsartan]   . Erythromycin   . Flagyl [Metronidazole]   . Glucophage [Metformin Hcl]   . Xanax [Alprazolam]   . Lasix [Furosemide] Swelling  . Tetracyclines & Related Hives  . Sulfa Drugs Cross Reactors Rash    Medications:  Current Outpatient Prescriptions on File Prior to Visit  Medication Sig Dispense Refill  . b complex vitamins tablet Take 1 tablet by mouth daily.      . cyanocobalamin (,VITAMIN B-12,) 1000 MCG/ML injection 1000 ml (1 ml) injection twice monthly  25 mL  3  . diazepam (VALIUM) 10 MG tablet Take 1 tablet (10 mg total) by mouth at bedtime as needed for anxiety.  30 tablet  3  . estradiol (ESTRACE) 0.5 MG tablet Take 1 tablet (0.5 mg total) by mouth daily.  90 tablet  2  . eszopiclone (LUNESTA) 2 MG  TABS Take 1 tablet (2 mg total) by mouth at bedtime. Take immediately before bedtime  90 tablet  3  . Glucose Blood (BAYER BREEZE 2 TEST) DISK Use one test strip each time glucose needs testing.  50 each  2  . hydrALAZINE (APRESOLINE) 25 MG tablet Take 1 tablet (25 mg total) by mouth 3 (three) times daily. As needed for bp > 160  90 tablet  2  . hydrocodone-acetaminophen (LORCET-HD) 5-500 MG per capsule Take 1 capsule by mouth every 6 (six) hours as needed.  30 capsule  3  . Insulin Aspart Prot & Aspart (NOVOLOG MIX 70/30 FLEXPEN [place]) Inject into the skin as needed. Sliding scale      . Loperamide HCl (IMODIUM PO) As needed      . losartan (COZAAR) 50 MG tablet       . magnesium oxide (MAG-OX 400) 400 MG tablet Take 1 tablet (400 mg total) by mouth daily.  90 tablet  2  . nebivolol (BYSTOLIC) 5 MG tablet Take 1 tablet (5 mg total)  by mouth daily.  90 tablet  2  . oxyCODONE-acetaminophen (PERCOCET/ROXICET) 5-325 MG per tablet Take 1 tablet by mouth every 6 (six) hours as needed.  30 tablet  0  . pantoprazole (PROTONIX) 40 MG tablet Take 1 tablet (40 mg total) by mouth daily.  90 tablet  2  . rOPINIRole (REQUIP) 1 MG tablet Take 1 tablet (1 mg total) by mouth at bedtime.  90 tablet  2  . sitaGLIPtin (JANUVIA) 100 MG tablet Take 1 tablet (100 mg total) by mouth daily.  90 tablet  2  . traMADol (ULTRAM) 50 MG tablet Take 1 tablet (50 mg total) by mouth 4 (four) times daily.  360 tablet  2   No current facility-administered medications on file prior to visit.    ROS:  Out of a complete 14 system review of symptoms, the patient complains only of the following symptoms, and all other reviewed systems are negative.  Weight gain, chills, fatigue Swelling in the legs Blurred vision  Shortness of breath Incontinence, diarrhea Anemia Joint pain, joint swelling, muscle cramps, achy muscles Headache, difficulty swallowing, dizziness, tremor Insomnia, restless legs  Blood pressure 124/78, pulse 64, weight 229 lb 8 oz (104.101 kg).  Physical Exam  General: The patient is alert and cooperative at the time of the examination. The patient is markedly obese.  Skin: No significant peripheral edema is noted.   Neurologic Exam  Cranial nerves: Facial symmetry is present. Speech is normal, no aphasia or dysarthria is noted. Extraocular movements are full. Visual fields are full.  Motor: The patient has good strength in all 4 extremities.  Coordination: The patient has good finger-nose-finger and heel-to-shin bilaterally.  Gait and station: The patient has a slightly wide-based gait. Tandem gait is slightly unsteady.. Romberg is negative. No drift is seen.  Reflexes: Deep tendon reflexes are symmetric, but are depressed.   Assessment/Plan:  1. Peripheral neuropathy  2. Diabetes  3. Gait disorder  4. Cervicogenic  headache  The patient will continue the tizanidine and Ultram if needed. The patient will followup through this office in about 8 or 9 months. The patient will contact our office if needed.  Jill Alexanders MD 03/25/2013 7:11 PM  [facility] [address] [place], [place] [address]  Phone [phone] Fax [phone]

## 2013-03-31 ENCOUNTER — Institutional Professional Consult (permissible substitution): Payer: Medicare Other | Admitting: Pulmonary Disease

## 2013-04-01 DIAGNOSIS — Z01818 Encounter for other preprocedural examination: Secondary | ICD-10-CM | POA: Diagnosis not present

## 2013-04-06 DIAGNOSIS — I10 Essential (primary) hypertension: Secondary | ICD-10-CM | POA: Diagnosis present

## 2013-04-06 DIAGNOSIS — K589 Irritable bowel syndrome without diarrhea: Secondary | ICD-10-CM | POA: Diagnosis present

## 2013-04-06 DIAGNOSIS — K449 Diaphragmatic hernia without obstruction or gangrene: Secondary | ICD-10-CM | POA: Diagnosis not present

## 2013-04-06 DIAGNOSIS — G4730 Sleep apnea, unspecified: Secondary | ICD-10-CM | POA: Diagnosis present

## 2013-04-06 DIAGNOSIS — IMO0002 Reserved for concepts with insufficient information to code with codable children: Secondary | ICD-10-CM | POA: Diagnosis present

## 2013-04-06 DIAGNOSIS — K660 Peritoneal adhesions (postprocedural) (postinfection): Secondary | ICD-10-CM | POA: Diagnosis not present

## 2013-04-06 DIAGNOSIS — Z6836 Body mass index (BMI) 36.0-36.9, adult: Secondary | ICD-10-CM | POA: Diagnosis not present

## 2013-04-06 DIAGNOSIS — E119 Type 2 diabetes mellitus without complications: Secondary | ICD-10-CM | POA: Diagnosis present

## 2013-04-06 DIAGNOSIS — J45909 Unspecified asthma, uncomplicated: Secondary | ICD-10-CM | POA: Diagnosis present

## 2013-04-06 DIAGNOSIS — Z9884 Bariatric surgery status: Secondary | ICD-10-CM | POA: Diagnosis not present

## 2013-04-06 DIAGNOSIS — F411 Generalized anxiety disorder: Secondary | ICD-10-CM | POA: Diagnosis present

## 2013-04-10 DIAGNOSIS — M7520 Bicipital tendinitis, unspecified shoulder: Secondary | ICD-10-CM | POA: Diagnosis not present

## 2013-04-10 DIAGNOSIS — M25519 Pain in unspecified shoulder: Secondary | ICD-10-CM | POA: Diagnosis not present

## 2013-04-14 ENCOUNTER — Telehealth: Payer: Self-pay | Admitting: Internal Medicine

## 2013-04-14 MED ORDER — GLUCOSE BLOOD VI DISK: DISK | Status: DC

## 2013-04-14 NOTE — Telephone Encounter (Signed)
Script sent to both

## 2013-04-14 NOTE — Telephone Encounter (Signed)
Glucose Blood (BAYER BREEZE 2 TEST) DISK   Send electronically to [facility] and also to [facility] [address] [place], [place] the patient is out of her test strips she has just had major surgery and she is needing to check her blood sugars more often.

## 2013-04-24 ENCOUNTER — Other Ambulatory Visit: Payer: Self-pay | Admitting: Internal Medicine

## 2013-04-24 NOTE — Telephone Encounter (Signed)
Last visit 01/19/13, ok to refill?

## 2013-04-24 NOTE — Telephone Encounter (Signed)
Needing Valium rx.  States only has 5 pills left.  [place].

## 2013-04-26 MED ORDER — DIAZEPAM 10 MG PO TABS: 10.0000 mg | ORAL_TABLET | Freq: Every evening | ORAL | Status: DC | PRN

## 2013-05-21 ENCOUNTER — Other Ambulatory Visit: Payer: Self-pay

## 2013-06-22 ENCOUNTER — Encounter: Payer: Self-pay | Admitting: Podiatry

## 2013-06-22 ENCOUNTER — Ambulatory Visit (INDEPENDENT_AMBULATORY_CARE_PROVIDER_SITE_OTHER): Payer: Medicare Other | Admitting: Podiatry

## 2013-06-22 VITALS — BP 115/76 | HR 77 | Resp 18

## 2013-06-22 DIAGNOSIS — M79609 Pain in unspecified limb: Secondary | ICD-10-CM

## 2013-06-22 DIAGNOSIS — B351 Tinea unguium: Secondary | ICD-10-CM

## 2013-06-22 NOTE — Progress Notes (Signed)
Lakshya presents today with a chief complaint of painful toenails one through 5 bilateral.  Objective: Nails are thick yellow dystrophic clinically mycotic pulses remain palpable bilateral.  Assessment: Pain in limb secondary to onychomycosis 1 through 5 bilateral.  Plan: Debridement of nails thickness and length as a covered service followup with me in 3 months.

## 2013-07-16 HISTORY — PX: DIAPHRAGMATIC HERNIA REPAIR: SUR1167

## 2013-08-03 ENCOUNTER — Telehealth: Payer: Self-pay | Admitting: Internal Medicine

## 2013-08-03 NOTE — Telephone Encounter (Signed)
States she was in with her daughter at an appt and [name] told her she needed to be seen.  Pt calling to make an appt.  States she is nauseous, has congestion, a bad cough, fatigued.  Only opening this week is Wednesday afternoon.  States she cannot come that day as she has to go with her daughter to cancer center.  Asking if [name] can see her any other time.  Also cannot come on Friday morning, has another appt.  Pt asking for a call.  States she will only see [name], unless [name] advises her to see another physician.

## 2013-08-03 NOTE — Telephone Encounter (Signed)
Pt called back stating no one has called her she had been waiting all day.  Made appointment for 08/04/13 with Raquel for congestion fatigue.  This has been going on since 12/29 Pt stated she was bleeding from rectum   Sent pt to traige

## 2013-08-04 ENCOUNTER — Encounter: Payer: Self-pay | Admitting: Adult Health

## 2013-08-04 ENCOUNTER — Ambulatory Visit (INDEPENDENT_AMBULATORY_CARE_PROVIDER_SITE_OTHER): Payer: Medicare Other | Admitting: Adult Health

## 2013-08-04 VITALS — BP 122/70 | HR 88 | Temp 98.3°F | Resp 12 | Wt 221.0 lb

## 2013-08-04 DIAGNOSIS — F32A Depression, unspecified: Secondary | ICD-10-CM | POA: Insufficient documentation

## 2013-08-04 DIAGNOSIS — R109 Unspecified abdominal pain: Secondary | ICD-10-CM | POA: Diagnosis not present

## 2013-08-04 DIAGNOSIS — D509 Iron deficiency anemia, unspecified: Secondary | ICD-10-CM | POA: Diagnosis not present

## 2013-08-04 DIAGNOSIS — R1031 Right lower quadrant pain: Secondary | ICD-10-CM | POA: Insufficient documentation

## 2013-08-04 DIAGNOSIS — R5383 Other fatigue: Secondary | ICD-10-CM

## 2013-08-04 DIAGNOSIS — R5381 Other malaise: Secondary | ICD-10-CM

## 2013-08-04 DIAGNOSIS — F329 Major depressive disorder, single episode, unspecified: Secondary | ICD-10-CM | POA: Insufficient documentation

## 2013-08-04 DIAGNOSIS — E559 Vitamin D deficiency, unspecified: Secondary | ICD-10-CM

## 2013-08-04 DIAGNOSIS — R531 Weakness: Secondary | ICD-10-CM | POA: Insufficient documentation

## 2013-08-04 LAB — IRON AND TIBC
%SAT: 18 % — ABNORMAL LOW (ref 20–55)
Iron: 57 ug/dL (ref 42–145)
TIBC: 312 ug/dL (ref 250–470)
UIBC: 255 ug/dL (ref 125–400)

## 2013-08-04 NOTE — Telephone Encounter (Signed)
Patient has an appointment with Rey NP.

## 2013-08-04 NOTE — Progress Notes (Signed)
Pre visit review using our clinic review tool, if applicable. No additional management support is needed unless otherwise documented below in the visit note. 

## 2013-08-04 NOTE — Assessment & Plan Note (Signed)
Check cbc w/diff, b12, vit d, tsh, cmet. Symptoms appear to be multifactorial from comorbidities. Hx of iron deficiency so will also check iron and ferritin.

## 2013-08-04 NOTE — Assessment & Plan Note (Signed)
Suspect multifactorial from hx of gastric bypass x 2 and other abdominal surgeries, adhesions, internal hemorrhoids. Discussed following up with Dr. Darnell Level, her GI specialist. She tells me that he is already aware of all these symptoms. He has recommended liquid protein diet for additional weight loss. She states that she will call his office.

## 2013-08-04 NOTE — Progress Notes (Signed)
Subjective:    Patient ID: Denise Sharp, female    DOB: Mar 25, 1946, 68 y.o.   MRN: [id number]  HPI  Pt is a 68 y/o female with multiple medical problems who presents to clinic with multiple health complaints.  She reports being sick since December. Family had flu during that time. Reports dizziness, fatigue, no energy. She believes she has anemia. She has had 4 lb weight changes in as little as a 24 hour period. Pt is running through symptoms one right after the other without allowing for an opportunity to ask any questions. She is currently not experiencing any congestion. She reports that this "comes and goes". She is so fatigued that she "could lie down on the floor in the exam room and just sleep". Reports hx of severe iron deficiency requiring infusion at Dr. Gary Fleet office. 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 On monthly B12 injections. She reports not taking many of her medications "because I just do not feel good".   She reports blood after having BM yesterday. She reports that everything she ate yesterday "went right through her". She is followed by Dr. Darnell Level, GI. She last saw Dr. Darnell Level in November. She reports that he is aware of all of her symptoms. He wanted her to go on a liquid protein diet for 3 months however she has not done so. The reason for going on this diet was to get her to lose an additional 50 pounds. She is status post gastric bypass x2 and considering a revision. He wants her to lose 50 pounds before doing this revision. Patient reports multiple adhesions secondary to all of the surgeries.  Patient reports hurting everywhere. Wants to know if there is a test for fibromyalgia. She reports having all trigger points for fibromyalgia.   Past Medical History  Diagnosis Date  . Diabetes mellitus   . Depression   . Hypertension   . Cervical spinal stenosis 1994    due to trauma to back ([employer] accident), has intermittent  paralysis and parasthesias  . Adrenal mass   . IBS (irritable bowel syndrome)   . Obstructive sleep apnea     using CPAP  . Anemia     iron deficient post  2 unit txfsn 2009, normal endo/colonoscopy by [facility]  . Esophageal stenosis March 2011    with transietn outlet obstruction by food, cleared by EGD   . LBBB (left bundle branch block)   . Obesity   . Apnea   . Headache(784.0)   . Arthritis   . Restless leg syndrome   . Dizziness     chronic dizziness  . Adrenal mass, left   . 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 Abnormality of gait 03/25/2013    Review of Systems  Constitutional: Positive for activity change, fatigue and unexpected weight change. Negative for fever and chills.  HENT: Negative.  Negative for congestion, postnasal drip and sinus pressure.   Respiratory: Negative for cough, shortness of breath and wheezing.   Cardiovascular:       Weight gain of 4 lbs in 24 hours  Gastrointestinal: Positive for nausea and diarrhea. Negative for vomiting.       Blood when she has BM. Reports blood on tissue when she wipes. Hx of internal hemorrhoids. Followed by Dr. Darnell Level  Genitourinary: Negative.   Musculoskeletal:       Reports hx of fibromyalgia  Neurological: Positive for dizziness and weakness.  Psychiatric/Behavioral: Positive for sleep disturbance. Negative  for behavioral problems, confusion and agitation.  All other systems reviewed and are negative.       Objective:   Physical Exam  Constitutional: She is oriented to person, place, and time.  Obese, 68 y/o female s/p gastric bypass x 2  HENT:  Head: Normocephalic and atraumatic.  Right Ear: External ear normal.  Left Ear: External ear normal.  Cardiovascular: Normal rate, regular rhythm and normal heart sounds.  Exam reveals no gallop.   No murmur heard. Pulmonary/Chest: Effort normal and breath sounds normal. No respiratory distress. She has no wheezes. She has no rales.  Good air movement throughout  lungs  Neurological: She is alert and oriented to person, place, and time.  Skin: Skin is warm and dry.  Psychiatric: She has a normal mood and affect. Her behavior is normal. Judgment and thought content normal.       Assessment & Plan:   n

## 2013-08-05 ENCOUNTER — Encounter: Payer: Self-pay | Admitting: Adult Health

## 2013-08-05 LAB — CBC WITH DIFFERENTIAL/PLATELET
Basophils Absolute: 0.1 10*3/uL (ref 0.0–0.1)
Basophils Relative: 0.6 % (ref 0.0–3.0)
Eosinophils Absolute: 0.1 10*3/uL (ref 0.0–0.7)
Eosinophils Relative: 0.7 % (ref 0.0–5.0)
HCT: 37.9 % (ref 36.0–46.0)
Hemoglobin: 12.7 g/dL (ref 12.0–15.0)
Lymphocytes Relative: 21.8 % (ref 12.0–46.0)
Lymphs Abs: 2.6 10*3/uL (ref 0.7–4.0)
MCHC: 33.4 g/dL (ref 30.0–36.0)
MCV: 86.4 fl (ref 78.0–100.0)
Monocytes Absolute: 0.6 10*3/uL (ref 0.1–1.0)
Monocytes Relative: 5.4 % (ref 3.0–12.0)
Neutro Abs: 8.7 10*3/uL — ABNORMAL HIGH (ref 1.4–7.7)
Neutrophils Relative %: 71.5 % (ref 43.0–77.0)
Platelets: 287 10*3/uL (ref 150.0–400.0)
RBC: 4.39 Mil/uL (ref 3.87–5.11)
RDW: 15 % — ABNORMAL HIGH (ref 11.5–14.6)
WBC: 12.1 10*3/uL — ABNORMAL HIGH (ref 4.5–10.5)

## 2013-08-05 LAB — COMPREHENSIVE METABOLIC PANEL
ALT: 20 U/L (ref 0–35)
AST: 18 U/L (ref 0–37)
Albumin: 3.4 g/dL — ABNORMAL LOW (ref 3.5–5.2)
Alkaline Phosphatase: 115 U/L (ref 39–117)
BUN: 13 mg/dL (ref 6–23)
CO2: 19 mEq/L (ref 19–32)
Calcium: 8.6 mg/dL (ref 8.4–10.5)
Chloride: 109 mEq/L (ref 96–112)
Creatinine, Ser: 0.9 mg/dL (ref 0.4–1.2)
GFR: 63.75 mL/min (ref >60.00)
Glucose, Bld: 158 mg/dL — ABNORMAL HIGH (ref 70–99)
Potassium: 3.6 mEq/L (ref 3.5–5.1)
Sodium: 138 mEq/L (ref 135–145)
Total Bilirubin: 0.3 mg/dL (ref 0.3–1.2)
Total Protein: 7.4 g/dL (ref 6.0–8.3)

## 2013-08-05 LAB — FERRITIN: Ferritin: 54.7 ng/mL (ref 10.0–291.0)

## 2013-08-05 LAB — TSH: TSH: 2.97 u[IU]/mL (ref 0.35–5.50)

## 2013-08-05 LAB — VITAMIN B12: Vitamin B-12: 962 pg/mL — ABNORMAL HIGH (ref 211–911)

## 2013-08-05 LAB — VITAMIN D 25 HYDROXY (VIT D DEFICIENCY, FRACTURES): Vit D, 25-Hydroxy: 17 ng/mL — ABNORMAL LOW (ref 30–89)

## 2013-08-06 NOTE — Telephone Encounter (Signed)
Patient called checking on labs read report to patient per E-mail patient stated she can tolerate Augmentin better than cipro.

## 2013-08-07 ENCOUNTER — Telehealth: Payer: Self-pay | Admitting: Internal Medicine

## 2013-08-07 ENCOUNTER — Ambulatory Visit: Payer: Self-pay | Admitting: Urology

## 2013-08-07 DIAGNOSIS — N3941 Urge incontinence: Secondary | ICD-10-CM | POA: Diagnosis not present

## 2013-08-07 DIAGNOSIS — R350 Frequency of micturition: Secondary | ICD-10-CM | POA: Diagnosis not present

## 2013-08-07 DIAGNOSIS — R059 Cough, unspecified: Secondary | ICD-10-CM | POA: Diagnosis not present

## 2013-08-07 DIAGNOSIS — N318 Other neuromuscular dysfunction of bladder: Secondary | ICD-10-CM | POA: Diagnosis not present

## 2013-08-07 DIAGNOSIS — D72829 Elevated white blood cell count, unspecified: Secondary | ICD-10-CM | POA: Insufficient documentation

## 2013-08-07 DIAGNOSIS — R339 Retention of urine, unspecified: Secondary | ICD-10-CM | POA: Diagnosis not present

## 2013-08-07 DIAGNOSIS — R918 Other nonspecific abnormal finding of lung field: Secondary | ICD-10-CM | POA: Diagnosis not present

## 2013-08-07 NOTE — Assessment & Plan Note (Addendum)
It appears from Ms Baller's exchange with Raquel via MyChart that Ms Sandidge was reluctant to start an antibiotic without knowing what it was going to be treating.  I will talk with Raquel about what she felt was the most probable diagnosis, but based on what is documented ,  There was no fever, no cough,  No urinary symptoms, just a lot of chronic symptoms that did not require an antibiotic (she has had diarrhea for years) so I do not think she needs an antibiotic just because her white count was slightly elevated.  Discussed with patient today ,  She has seen her urologist who ordered a chest x ray which is pending.  If it shows an infiltrate I will treat with Levaquin,  If not, will treat for residual viral symptoms with prednisone taper.  She will follow up with me next week

## 2013-08-07 NOTE — Telephone Encounter (Signed)
Pt states she was seen by R. Rey and was to be prescribed antibiotic.  Pt states the antibiotic was not called in.  Pt states she had to speak with answering service to see if rx could be called in.  Answering service tried to have dr. Elodia Florence to call in antiboitic for pt.  Pt states on-call dr refused to call in rx because R. Rey's note is incomplete, does not state why the pt needs an antibiotic, and the on-call doctor disagreed with the antibiotic that had been prescribed based on what the pt's symptoms were.  Pt is very upset as she was told that she needed to start the antibiotic right away and now has not been able to get any medication.  States the answering service was terrible and she is very disappointed.  Pt states she is going to her endocrinologist this morning.  She states that "her endocrinologist will be calling and getting [name] out of a room to speak with her about this".

## 2013-08-07 NOTE — Telephone Encounter (Signed)
FYI

## 2013-08-07 NOTE — Telephone Encounter (Signed)
I called patient back at 12:30 .  Please give her an appt with me next week.  She is in the middle of getting a chest x ray currently (ordered by her urologist Dr Jacqlyn Larsen) so please wait until 1:30 to call her back.

## 2013-08-08 ENCOUNTER — Telehealth: Payer: Self-pay | Admitting: Internal Medicine

## 2013-08-08 MED ORDER — PREDNISONE (PAK) 10 MG PO TABS: ORAL_TABLET | ORAL | Status: DC

## 2013-08-08 NOTE — Telephone Encounter (Signed)
Called patient to notify her that her chest x ray, ordered by her urologist,  Was normal.  Got voice mail,  And sent her mychart message re [facility]

## 2013-08-10 ENCOUNTER — Ambulatory Visit: Payer: Medicare Other | Admitting: Internal Medicine

## 2013-08-11 ENCOUNTER — Ambulatory Visit (INDEPENDENT_AMBULATORY_CARE_PROVIDER_SITE_OTHER): Payer: Medicare Other | Admitting: Internal Medicine

## 2013-08-11 ENCOUNTER — Encounter: Payer: Self-pay | Admitting: Internal Medicine

## 2013-08-11 VITALS — BP 126/68 | HR 76 | Temp 98.4°F | Resp 12 | Ht 64.5 in | Wt 221.0 lb

## 2013-08-11 DIAGNOSIS — R197 Diarrhea, unspecified: Secondary | ICD-10-CM

## 2013-08-11 DIAGNOSIS — Z9989 Dependence on other enabling machines and devices: Secondary | ICD-10-CM

## 2013-08-11 DIAGNOSIS — G4733 Obstructive sleep apnea (adult) (pediatric): Secondary | ICD-10-CM

## 2013-08-11 DIAGNOSIS — K589 Irritable bowel syndrome without diarrhea: Secondary | ICD-10-CM

## 2013-08-11 DIAGNOSIS — R5383 Other fatigue: Secondary | ICD-10-CM

## 2013-08-11 DIAGNOSIS — R5381 Other malaise: Secondary | ICD-10-CM

## 2013-08-11 DIAGNOSIS — K222 Esophageal obstruction: Secondary | ICD-10-CM | POA: Diagnosis not present

## 2013-08-11 MED ORDER — ERGOCALCIFEROL 1.25 MG (50000 UT) PO CAPS: 50000.0000 [IU] | ORAL_CAPSULE | ORAL | Status: DC

## 2013-08-11 MED ORDER — DICYCLOMINE HCL 10 MG/5ML PO SOLN: 10.0000 mg | Freq: Three times a day (TID) | ORAL | Status: DC

## 2013-08-11 NOTE — Assessment & Plan Note (Signed)
Managed with CPAP.  Last sleep study was about 6 months ago.

## 2013-08-11 NOTE — Assessment & Plan Note (Signed)
Complicated by increased transit time by prior gastric emptying studies.  Trial of liquid dicyclomine

## 2013-08-11 NOTE — Assessment & Plan Note (Signed)
Stool studies ordered including c dificile

## 2013-08-11 NOTE — Progress Notes (Signed)
Patient Active Problem List   Diagnosis Date Noted  . Diarrhea 08/11/2013  . Leukocytosis, unspecified 08/07/2013  . Fatigue 08/04/2013  . Abdominal discomfort in right lower quadrant 08/04/2013  . 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 Abnormality of gait 03/25/2013  . Multiple pulmonary nodules 03/20/2013  . SOB (shortness of breath) 03/20/2013  . Postprandial vomiting 01/20/2013  . Anemia 08/06/2012  . Iron deficiency anemia 08/06/2012  . OSA on CPAP 03/02/2012  . Esophageal stenosis   . Insomnia secondary to anxiety 02/29/2012  . Bundle branch block, left 01/07/2012  . Sciatica 09/05/2011  . Cervical stenosis of spinal canal 06/14/2011  . Adrenal mass 03/13/2011  . Irritable bowel syndrome 03/13/2011  . Restless legs syndrome 03/13/2011  . Degenerative disk disease 03/13/2011  . Hypertension 03/12/2011  . 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 Hearing loss, right 03/12/2011    Subjective:  CC:   Chief Complaint  Patient presents with  . Follow-up    HPI:   Denise Sharp a 68 y.o. female who presents with the chief complaint:  "I've been sick since Dec 29th."  Having  8 to 10 loose stools dailly. This is chronic  But worse lately since family visited over the holidays and came down with GI illness.  She has been treating  herself as her family did for viral GI illness but has not improved  .  Feels exc she eatrsessively fatigued.  Her  BP drops with any activity to 90/50 ,  Gets light headed.  Goes back to bed.  Hasn't had a single stool today despite having a hamburger for lunch 30 minutes ago and a protein shake this morning, but had 3 in the middle of the night.  Hernia repair was Sept 2014,  Was on a low glycemic index diet /protein drinks by bariatric surgery for 6 weeks prior to surgery.  Transitioned to clear liquids then full liquids,  Still having liquid stools 5 to 6 daily .  Frequency got  worse after family became ill over the holidays dec 29th,     RLQ pain after eating , occurs every time she eats.  The pain has been attributed to scar tissue and adhesions by bariatric surgeon and told there was nothing he could do anymore  , after lysing adhesions and removing scar tissue. He advised an all protein liquid diet to help her lose 50 lbs before she has her pannectomy but she has not started it. bc of the increased diarrhea. Has lost 8 lbs.  Cant get under 200 lbs.       Stuffy head post nasal drip,  productive cough only once in a while,  For the past week.  Coughing spells occur when stomach is hurting,  Starts with gagging. [name] Not wheezing,  But has occasional chest pain .     Her Last sleep study was just 6 months ago. Checking  Bloods sugars.  Not dropping below 89 and not over 135. Still taking hydralazine,  bystolic and losartan  .        Past Medical History  Diagnosis Date  . Diabetes mellitus   . Depression   . Hypertension   . Cervical spinal stenosis 1994    due to trauma to back ([employer] accident), has intermittent paralysis and parasthesias  . Adrenal mass   . IBS (irritable bowel syndrome)   . Obstructive sleep apnea     using CPAP  . Anemia  iron deficient post  2 unit txfsn 2009, normal endo/colonoscopy by [facility]  . Esophageal stenosis March 2011    with transietn outlet obstruction by food, cleared by EGD   . LBBB (left bundle branch block)   . Obesity   . Apnea   . Headache(784.0)   . Arthritis   . Restless leg syndrome   . Dizziness     chronic dizziness  . Adrenal mass, left   . 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 Abnormality of gait 03/25/2013    Past Surgical History  Procedure Laterality Date  . Gastric bypass    . Abdominal hysterectomy    . Gastric bypass  2000, 2005    Dr. Debroah Loop  . Appendectomy    . Gallbladder surgery  1976  . Rotator cuff repair      right  . Joint replacement  2007    bilateral knee.  [name],  [name]  . Spine surgery  1995    Botero  . Interstim implant placement  April 2013    Cope  . Gallbladder surgery  resection       The following portions of the patient's history were reviewed and updated as appropriate: Allergies, current medications, and problem list.    Review of Systems:   12 Pt  review of systems was negative except those addressed in the HPI,     History   Social History  . Marital Status: Married    Spouse Name: N/A    Number of Children: N/A  . Years of Education: N/A   Occupational History  . Not on file.   Social History Main Topics  . Smoking status: Never Smoker   . Smokeless tobacco: Never Used  . Alcohol Use: No  . Drug Use: No  . Sexual Activity: Not Currently   Other Topics Concern  . Not on file   Social History Narrative  . No narrative on file    Objective:  Filed Vitals:   08/11/13 1427  BP: 126/68  Pulse: 76  Temp: 98.4 F (36.9 C)  Resp: 12     General appearance: alert, cooperative and appears stated age Ears: normal TM's and external ear canals both ears Throat: lips, mucosa, and tongue normal; teeth and gums normal Neck: no adenopathy, no carotid bruit, supple, symmetrical, trachea midline and thyroid not enlarged, symmetric, no tenderness/mass/nodules Back: symmetric, no curvature. ROM normal. No CVA tenderness. Lungs: clear to auscultation bilaterally Heart: regular rate and rhythm, S1, S2 normal, no murmur, click, rub or gallop Abdomen: soft, non-tender; bowel sounds normal; no masses,  no organomegaly Pulses: 2+ and symmetric Skin: Skin color, texture, turgor normal. No rashes or lesions Lymph nodes: Cervical, supraclavicular, and axillary nodes normal.  Assessment and Plan:  Irritable bowel syndrome Complicated by increased transit time by prior gastric emptying studies.  Trial of liquid dicyclomine  OSA on CPAP Managed with CPAP.  Last sleep study was about 6 months ago.    Diarrhea Stool studies ordered including c dificile  Esophageal stenosis S/p repair of paraesophageal hernia repair by Dr. Darnell Level Sept 2014 at [facility]   Fatigue Secondary to orthostasis and dehydration.  Hydralazine and losartan suspended.    A total of 40 minutes was spent with patient more than half of which was spent in counseling, reviewing records from other prviders and coordination of care.  Updated Medication List Outpatient Encounter Prescriptions as of 08/11/2013  Medication Sig  . diazepam (VALIUM) 10 MG tablet Take 1 tablet (10 mg  total) by mouth at bedtime as needed for anxiety.  [name] estradiol (ESTRACE) 0.5 MG tablet Take 1 tablet (0.5 mg total) by mouth daily.  . eszopiclone (LUNESTA) 2 MG TABS Take 1 tablet (2 mg total) by mouth at bedtime. Take immediately before bedtime  . Glucose Blood (BAYER BREEZE 2 TEST) DISK Use one test strip each time glucose needs testing.  . hydrALAZINE (APRESOLINE) 25 MG tablet Take 1 tablet (25 mg total) by mouth 3 (three) times daily. As needed for bp > 160  . hydrocodone-acetaminophen (LORCET-HD) 5-500 MG per capsule Take 1 capsule by mouth every 6 (six) hours as needed.  . Insulin Aspart Prot & Aspart (NOVOLOG MIX 70/30 FLEXPEN [place]) Inject into the skin as needed. Sliding scale  . Loperamide HCl (IMODIUM PO) As needed  . losartan (COZAAR) 50 MG tablet   . nebivolol (BYSTOLIC) 5 MG tablet Take 1 tablet (5 mg total) by mouth daily.  [name] oxyCODONE-acetaminophen (PERCOCET/ROXICET) 5-325 MG per tablet Take 1 tablet by mouth every 6 (six) hours as needed.  . predniSONE (STERAPRED UNI-PAK) 10 MG tablet 6 tablets on Day 1 , then reduce by 1 tablet daily until gone  . pregabalin (LYRICA) 75 MG capsule Take 75 mg by mouth 2 (two) times daily.  [name] rOPINIRole (REQUIP) 1 MG tablet Take 1 tablet (1 mg total) by mouth at bedtime.  . sitaGLIPtin (JANUVIA) 100 MG tablet Take 1 tablet (100 mg total) by mouth daily.  . tizanidine (ZANAFLEX) 2 MG capsule Take 1  capsule (2 mg total) by mouth 3 (three) times daily as needed for muscle spasms.  . traMADol (ULTRAM) 50 MG tablet Take 50 mg by mouth 4 (four) times daily as needed.  . cyanocobalamin (,VITAMIN B-12,) 1000 MCG/ML injection 1000 ml (1 ml) injection twice monthly  . dicyclomine (BENTYL) 10 MG/5ML syrup Take 5 mLs (10 mg total) by mouth 4 (four) times daily -  before meals and at bedtime.  . ergocalciferol (DRISDOL) 50000 UNITS capsule Take 1 capsule (50,000 Units total) by mouth once a week.  . [DISCONTINUED] pantoprazole (PROTONIX) 40 MG tablet Take 1 tablet (40 mg total) by mouth daily.     Orders Placed This Encounter  Procedures  . Ova and Parasite screen (Stool)  . Stool culture  . Clostridium difficile EIA  . Norovirus group 1 & 2 by PCR, stool    No Follow-up on file.

## 2013-08-11 NOTE — Patient Instructions (Signed)
Your fatigue may be aggravated by your blood pressure medications  Please suspend the losartan and the hydralazine  For now.  Continue the bystolic.  Add the losartan back only if your upright (standing up) bp is > 140/80   For the diarrhea:    Trial of liquid bentyl 15 to 20 minutes before every meal.  This is an antispasmodic.,  You may increase the dose to 10 mg if needed  Stool studies on next liquid stool  To rule out infection   Return in 2 weeks if no better.

## 2013-08-11 NOTE — Assessment & Plan Note (Signed)
S/p repair of paraesophageal hernia repair by Dr. Darnell Level Sept 2014 at [facility]

## 2013-08-11 NOTE — Assessment & Plan Note (Signed)
Secondary to orthostasis and dehydration.  Hydralazine and losartan suspended.

## 2013-08-12 ENCOUNTER — Telehealth: Payer: Self-pay | Admitting: Internal Medicine

## 2013-08-12 NOTE — Telephone Encounter (Signed)
Vitamin D is 1000 IU.  Husband has a bottle of 5000 IU and a bottle of 10,000 IU.  Pt did not get what was ordered at the drug store because she did not see the point in getting those when she had 3 bottles at home.  Please advise pt what dose to take.  Pt states she is dropping her stool sample off around lunch time today and would like to know the vitamin D dose when she comes in at that time.

## 2013-08-12 NOTE — Telephone Encounter (Signed)
She can take 5000 units daily for one week,  Then 10,000 units weekly for the rest of the winter.

## 2013-08-12 NOTE — Telephone Encounter (Signed)
Pt notified and verbalized understanding.

## 2013-08-17 DIAGNOSIS — M25519 Pain in unspecified shoulder: Secondary | ICD-10-CM | POA: Diagnosis not present

## 2013-08-17 DIAGNOSIS — M542 Cervicalgia: Secondary | ICD-10-CM | POA: Diagnosis not present

## 2013-08-19 ENCOUNTER — Other Ambulatory Visit: Payer: Self-pay | Admitting: *Deleted

## 2013-08-19 DIAGNOSIS — R197 Diarrhea, unspecified: Secondary | ICD-10-CM

## 2013-08-20 ENCOUNTER — Encounter: Payer: Self-pay | Admitting: Internal Medicine

## 2013-08-20 LAB — CLOSTRIDIUM DIFFICILE EIA: CDIFTX: NEGATIVE

## 2013-08-23 ENCOUNTER — Encounter: Payer: Self-pay | Admitting: Internal Medicine

## 2013-08-23 LAB — STOOL CULTURE

## 2013-08-24 NOTE — Telephone Encounter (Signed)
Mailed unread message to pt  

## 2013-08-25 ENCOUNTER — Encounter: Payer: Self-pay | Admitting: Internal Medicine

## 2013-08-25 ENCOUNTER — Ambulatory Visit (INDEPENDENT_AMBULATORY_CARE_PROVIDER_SITE_OTHER): Payer: Medicare Other | Admitting: Internal Medicine

## 2013-08-25 VITALS — BP 140/82 | HR 69 | Temp 98.5°F | Resp 16 | Wt 219.8 lb

## 2013-08-25 DIAGNOSIS — E08610 Diabetes mellitus due to underlying condition with diabetic neuropathic arthropathy: Secondary | ICD-10-CM

## 2013-08-25 DIAGNOSIS — R197 Diarrhea, unspecified: Secondary | ICD-10-CM | POA: Diagnosis not present

## 2013-08-25 DIAGNOSIS — I10 Essential (primary) hypertension: Secondary | ICD-10-CM

## 2013-08-25 DIAGNOSIS — D649 Anemia, unspecified: Secondary | ICD-10-CM

## 2013-08-25 DIAGNOSIS — Z9989 Dependence on other enabling machines and devices: Secondary | ICD-10-CM

## 2013-08-25 DIAGNOSIS — E669 Obesity, unspecified: Secondary | ICD-10-CM

## 2013-08-25 DIAGNOSIS — E1349 Other specified diabetes mellitus with other diabetic neurological complication: Secondary | ICD-10-CM | POA: Diagnosis not present

## 2013-08-25 DIAGNOSIS — G4733 Obstructive sleep apnea (adult) (pediatric): Secondary | ICD-10-CM

## 2013-08-25 LAB — COMPREHENSIVE METABOLIC PANEL
ALT: 12 U/L (ref 0–35)
AST: 22 U/L (ref 0–37)
Albumin: 3.5 g/dL (ref 3.5–5.2)
Alkaline Phosphatase: 122 U/L — ABNORMAL HIGH (ref 39–117)
BUN: 14 mg/dL (ref 6–23)
CO2: 23 mEq/L (ref 19–32)
Calcium: 9 mg/dL (ref 8.4–10.5)
Chloride: 108 mEq/L (ref 96–112)
Creatinine, Ser: 0.8 mg/dL (ref 0.4–1.2)
GFR: 71.68 mL/min (ref >60.00)
Glucose, Bld: 106 mg/dL — ABNORMAL HIGH (ref 70–99)
Potassium: 4.9 mEq/L (ref 3.5–5.1)
Sodium: 140 mEq/L (ref 135–145)
Total Bilirubin: 0.7 mg/dL (ref 0.3–1.2)
Total Protein: 6.6 g/dL (ref 6.0–8.3)

## 2013-08-25 LAB — HEMOGLOBIN A1C: Hgb A1c MFr Bld: 6.7 % — ABNORMAL HIGH (ref 4.6–6.5)

## 2013-08-25 MED ORDER — DICYCLOMINE HCL 10 MG/5ML PO SOLN: 10.0000 mg | Freq: Three times a day (TID) | ORAL | Status: DC

## 2013-08-25 NOTE — Patient Instructions (Signed)
Try increasing the dicyclomine  To 10 mg before meals..    Use up your old supplies of vitamin d3. You  CAN TAKE 10,000 units weekly for the rest of the winter .  We will recheck your D  In 3 months with next set of labs

## 2013-08-25 NOTE — Assessment & Plan Note (Signed)
Noninfectious by recent evaluation.  Rapid transit secondary to prior bypass surgery and IBS.[name]  Continue dicyclomine.

## 2013-08-25 NOTE — Progress Notes (Addendum)
Patient ID: Denise Sharp, female   DOB: Dec 13, 1945, 68 y.o.   MRN: [id number]  Patient Active Problem List   Diagnosis Date Noted  . Obesity, unspecified 08/25/2013  . Diarrhea 08/11/2013  . Leukocytosis, unspecified 08/07/2013  . Fatigue 08/04/2013  . Abdominal discomfort in right lower quadrant 08/04/2013  . 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 Abnormality of gait 03/25/2013  . Multiple pulmonary nodules 03/20/2013  . SOB (shortness of breath) 03/20/2013  . Postprandial vomiting 01/20/2013  . Anemia 08/06/2012  . Iron deficiency anemia 08/06/2012  . OSA on CPAP 03/02/2012  . Esophageal stenosis   . Insomnia secondary to anxiety 02/29/2012  . Bundle branch block, left 01/07/2012  . Sciatica 09/05/2011  . Cervical stenosis of spinal canal 06/14/2011  . Adrenal mass 03/13/2011  . Irritable bowel syndrome 03/13/2011  . Restless legs syndrome 03/13/2011  . Degenerative disk disease 03/13/2011  . Hypertension 03/12/2011  . 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 Hearing loss, right 03/12/2011    Subjective:  CC:   Chief Complaint  Patient presents with  . Follow-up    2 week/ for diarrhea    HPI:   Denise Sharp is a 68 y.o. female who presents for  Follow up on multiple problems addressed two weeks ago.   1) diarrhea.  Stool studies were negative.  She reports that trial of dicyclomine  5 mg  has slowed down the diarrhea a little bit.  She is not taking it 4 times daily bc she is not eating 4 times daily .  She also notes that the 437 ml bottle cost her $120 because she did not have it filled at [place].    2) Left shoulder pain.  She had a cortisone shot In her left shoulder which provided relief for only two days. She is getting  An MRI  Of the shoulder from [facility]   3) She has a dental appt at [facility] tomorrow to address her cracked teeth.     4) Presyncope and malaise,  Her blood  pressure has improved, no longer feel presyncopal.  5) Obesity with pannus :  Bruce wants her to do the Medifast liquid diet to lose pannus due to recurrent candida in her skin folds.   History of wt loss post surgery to 169 lbs initially.    Past Medical History  Diagnosis Date  . Diabetes mellitus   . Depression   . Hypertension   . Cervical spinal stenosis 1994    due to trauma to back ([employer] accident), has intermittent paralysis and parasthesias  . Adrenal mass   . IBS (irritable bowel syndrome)   . Obstructive sleep apnea     using CPAP  . Anemia     iron deficient post  2 unit txfsn 2009, normal endo/colonoscopy by [facility]  . Esophageal stenosis March 2011    with transietn outlet obstruction by food, cleared by EGD   . LBBB (left bundle branch block)   . Obesity   . Apnea   . Headache(784.0)   . Arthritis   . Restless leg syndrome   . Dizziness     chronic dizziness  . Adrenal mass, left   . 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 Abnormality of gait 03/25/2013    Past Surgical History  Procedure Laterality Date  . Gastric bypass    . Abdominal hysterectomy    . Gastric bypass  2000, 2005    Dr.  Rutledge  . Appendectomy    . Gallbladder surgery  1976  . Rotator cuff repair      right  . Joint replacement  2007    bilateral knee. [name],  [name]  . Spine surgery  1995    Botero  . Interstim implant placement  April 2013    Cope  . Gallbladder surgery  resection       The following portions of the patient's history were reviewed and updated as appropriate: Allergies, current medications, and problem list.    Review of Systems:   Patient denies headache, fevers, malaise, unintentional weight loss, skin rash, eye pain, sinus congestion and sinus pain, sore throat, dysphagia,  hemoptysis , cough, dyspnea, wheezing, chest pain, palpitations, orthopnea, edema, abdominal pain, nausea, melena, diarrhea, constipation, flank pain, dysuria, hematuria,  urinary  Frequency, nocturia, numbness, tingling, seizures,  Focal weakness, Loss of consciousness,  Tremor, insomnia, depression, anxiety, and suicidal ideation.     History   Social History  . Marital Status: Married    Spouse Name: N/A    Number of Children: N/A  . Years of Education: N/A   Occupational History  . Not on file.   Social History Main Topics  . Smoking status: Never Smoker   . Smokeless tobacco: Never Used  . Alcohol Use: No  . Drug Use: No  . Sexual Activity: Not Currently   Other Topics Concern  . Not on file   Social History Narrative  . No narrative on file    Objective:  Filed Vitals:   08/25/13 1240  BP: 140/82  Pulse: 69  Temp: 98.5 F (36.9 C)  Resp: 16     General appearance: alert, cooperative and appears stated age Ears: normal TM's and external ear canals both ears Throat: lips, mucosa, and tongue normal; teeth and gums normal Neck: no adenopathy, no carotid bruit, supple, symmetrical, trachea midline and thyroid not enlarged, symmetric, no tenderness/mass/nodules Back: symmetric, no curvature. ROM normal. No CVA tenderness. Lungs: clear to auscultation bilaterally Heart: regular rate and rhythm, S1, S2 normal, no murmur, click, rub or gallop Abdomen: soft, non-tender; bowel sounds normal; no masses,  no organomegaly Pulses: 2+ and symmetric Skin: Skin color, texture, turgor normal. No rashes or lesions Lymph nodes: Cervical, supraclavicular, and axillary nodes normal.  Assessment and Plan:  Diarrhea Noninfectious by recent evaluation.  Rapid transit secondary to prior bypass surgery and IBS.[name]  Continue dicyclomine.   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  Foot exam is up to date and she is seeing podiatry regularly for plantar fasciitis and OA.  Eye exam was done in April .  On appropriate meds as tolerated  Obesity,  unspecified Recommended starting the Medifast diet as suggested by Dr Darnell Level  OSA on CPAP Managed with CPAP.  Last sleep study was about in 2014.   She is wearing her CPAP every night a minimum of 6 hours per night.     Updated Medication List Outpatient Encounter Prescriptions as of 08/25/2013  Medication Sig  . dicyclomine (BENTYL) 10 MG/5ML syrup Take 5 mLs (10 mg total) by mouth 4 (four) times daily -  before meals and at bedtime.  . ergocalciferol (DRISDOL) 50000 UNITS capsule Take 1 capsule (50,000 Units total) by mouth once a week.  . estradiol (ESTRACE) 0.5 MG tablet Take 1 tablet (0.5 mg total) by mouth daily.  . Glucose Blood (BAYER BREEZE 2 TEST) DISK  Use one test strip each time glucose needs testing.  . hydrocodone-acetaminophen (LORCET-HD) 5-500 MG per capsule Take 1 capsule by mouth every 6 (six) hours as needed.  . Insulin Aspart Prot & Aspart (NOVOLOG MIX 70/30 FLEXPEN [place]) Inject into the skin as needed. Sliding scale  . Loperamide HCl (IMODIUM PO) As needed  . oxyCODONE-acetaminophen (PERCOCET/ROXICET) 5-325 MG per tablet Take 1 tablet by mouth every 6 (six) hours as needed.  . pregabalin (LYRICA) 75 MG capsule Take 75 mg by mouth 2 (two) times daily.  [name] rOPINIRole (REQUIP) 1 MG tablet Take 1 tablet (1 mg total) by mouth at bedtime.  . sitaGLIPtin (JANUVIA) 100 MG tablet Take 1 tablet (100 mg total) by mouth daily.  . tizanidine (ZANAFLEX) 2 MG capsule Take 1 capsule (2 mg total) by mouth 3 (three) times daily as needed for muscle spasms.  . traMADol (ULTRAM) 50 MG tablet Take 50 mg by mouth 4 (four) times daily as needed.  . [DISCONTINUED] diazepam (VALIUM) 10 MG tablet Take 1 tablet (10 mg total) by mouth at bedtime as needed for anxiety.  . [DISCONTINUED] dicyclomine (BENTYL) 10 MG/5ML syrup Take 5 mLs (10 mg total) by mouth 4 (four) times daily -  before meals and at bedtime.  . [DISCONTINUED] eszopiclone (LUNESTA) 2 MG TABS Take 1 tablet (2 mg total) by mouth at  bedtime. Take immediately before bedtime  . [DISCONTINUED] nebivolol (BYSTOLIC) 5 MG tablet Take 1 tablet (5 mg total) by mouth daily.  . cyanocobalamin (,VITAMIN B-12,) 1000 MCG/ML injection 1000 ml (1 ml) injection twice monthly  . losartan (COZAAR) 50 MG tablet   . predniSONE (STERAPRED UNI-PAK) 10 MG tablet 6 tablets on Day 1 , then reduce by 1 tablet daily until gone  . [DISCONTINUED] hydrALAZINE (APRESOLINE) 25 MG tablet Take 1 tablet (25 mg total) by mouth 3 (three) times daily. As needed for bp > 160

## 2013-08-25 NOTE — Assessment & Plan Note (Addendum)
She is overdue for A1c, which was drawn today.  Foot exam is up to date and she is seeing podiatry regularly for plantar fasciitis and OA.  Eye exam was done in April .  On appropriate meds as tolerated

## 2013-08-25 NOTE — Assessment & Plan Note (Signed)
Recommended starting the Medifast diet as suggested by Dr Darnell Level

## 2013-08-26 ENCOUNTER — Telehealth: Payer: Self-pay | Admitting: Internal Medicine

## 2013-08-26 ENCOUNTER — Encounter: Payer: Self-pay | Admitting: Internal Medicine

## 2013-08-26 DIAGNOSIS — D2310 Other benign neoplasm of skin of unspecified eyelid, including canthus: Secondary | ICD-10-CM | POA: Diagnosis not present

## 2013-08-26 NOTE — Telephone Encounter (Signed)
Relevant patient education assigned to patient using Emmi. ° °

## 2013-08-27 ENCOUNTER — Other Ambulatory Visit: Payer: Self-pay | Admitting: Orthopedic Surgery

## 2013-08-27 ENCOUNTER — Telehealth: Payer: Self-pay

## 2013-08-27 DIAGNOSIS — M25512 Pain in left shoulder: Secondary | ICD-10-CM

## 2013-08-27 DIAGNOSIS — N3941 Urge incontinence: Secondary | ICD-10-CM | POA: Diagnosis not present

## 2013-08-27 NOTE — Telephone Encounter (Signed)
Relevant patient education assigned to patient using Emmi. ° °

## 2013-08-31 DIAGNOSIS — D2310 Other benign neoplasm of skin of unspecified eyelid, including canthus: Secondary | ICD-10-CM | POA: Diagnosis not present

## 2013-08-31 NOTE — Telephone Encounter (Signed)
Mailed copy of MyChart email to pt.

## 2013-09-02 ENCOUNTER — Other Ambulatory Visit: Payer: Medicare Other

## 2013-09-02 DIAGNOSIS — L919 Hypertrophic disorder of the skin, unspecified: Secondary | ICD-10-CM | POA: Diagnosis not present

## 2013-09-02 DIAGNOSIS — L821 Other seborrheic keratosis: Secondary | ICD-10-CM | POA: Diagnosis not present

## 2013-09-02 DIAGNOSIS — L909 Atrophic disorder of skin, unspecified: Secondary | ICD-10-CM | POA: Diagnosis not present

## 2013-09-03 ENCOUNTER — Ambulatory Visit
Admission: RE | Admit: 2013-09-03 | Discharge: 2013-09-03 | Disposition: A | Discharge status: Home / Self Care | Payer: Medicare Other | Source: Ambulatory Visit | Attending: Orthopedic Surgery | Admitting: Orthopedic Surgery

## 2013-09-03 ENCOUNTER — Telehealth: Payer: Self-pay | Admitting: *Deleted

## 2013-09-03 DIAGNOSIS — R918 Other nonspecific abnormal finding of lung field: Secondary | ICD-10-CM

## 2013-09-03 DIAGNOSIS — M25512 Pain in left shoulder: Secondary | ICD-10-CM

## 2013-09-03 DIAGNOSIS — M19019 Primary osteoarthritis, unspecified shoulder: Secondary | ICD-10-CM | POA: Diagnosis not present

## 2013-09-03 DIAGNOSIS — M25519 Pain in unspecified shoulder: Secondary | ICD-10-CM | POA: Diagnosis not present

## 2013-09-03 MED ORDER — IOHEXOL 180 MG/ML  SOLN
15.0000 mL | Freq: Once | INTRAMUSCULAR | Status: AC | PRN
  Administered 2013-09-03: 15 mL via INTRA_ARTICULAR

## 2013-09-03 NOTE — Telephone Encounter (Signed)
Message copied by Rosana Berger on Thu Sep 03, 2013 12:22 PM ------      Message from: Christinia Gully B      Created: Fri Mar 20, 2013  1:23 PM       Ct chest by now no contrast f/u nodules ------

## 2013-09-07 ENCOUNTER — Telehealth: Payer: Self-pay | Admitting: Internal Medicine

## 2013-09-07 NOTE — Telephone Encounter (Signed)
Called spoke with pt. Advised her the CT is to f/u on her pulm nodules. She is ready to have this scheduled now. Please advise PCC's thanks  Call [phone]

## 2013-09-07 NOTE — Telephone Encounter (Signed)
LMOAM for pt to return my call. Needs to be scheduled at [facility]. Denise Sharp

## 2013-09-07 NOTE — Telephone Encounter (Signed)
[email or web address] [id number] 

## 2013-09-07 NOTE — Telephone Encounter (Signed)
LMOAM for pt to return my call to schedule CT Chest at [facility]. Rhonda J Cobb

## 2013-09-08 NOTE — Telephone Encounter (Signed)
Please call pt at [phone] home do not leave a msg call her cell [phone]

## 2013-09-09 NOTE — Telephone Encounter (Signed)
Spoke with pt and advised of Dr Gustavus Bryant recommendations.  PT will wait and have CT and then see what Dr Melvyn Novas recommends.  Instructed pt to call our office if symptoms increase in the mean time.

## 2013-09-09 NOTE — Telephone Encounter (Signed)
I do not see a f/u time on last OV note with MW. I spoke with pt.  States MW didn't mention to her when she needs to follow up with him Dr. Melvyn Novas, pt would like to know if she needs to go ahead and schedule a f/u appt with you for after the CT or if she should wait to see what the scan shows.  Pls advise.  Thank you.

## 2013-09-09 NOTE — Telephone Encounter (Signed)
As long as no sob or cough then ok just to do the CT and send me the results, if still having symptoms I need to see her and do the CT both

## 2013-09-09 NOTE — Telephone Encounter (Signed)
CT Chest has been scheduled for Friday 09/18/13 at 1:00. Above date of 09/17/13 was an error.  Pt is aware that CT Chest is on Friday 09/18/13 at 1:00 at [facility].   Please advise patient if she needs f/u appointment with Dr. Melvyn Novas. Contact on mobile number. Rhonda J Cobb

## 2013-09-09 NOTE — Telephone Encounter (Signed)
Pt returned call and CT Chest has been scheduled at [facility] for Friday 09/17/13 at 1:00. Pt to arrive at 12:45 and enter through the medical mall entrance. Pt is aware of appointment date, time and location. Referral has been faxed to [facility].   Pt would like to know if she needs a f/u appointment with Dr. Melvyn Novas after the CT Chest scan. Please contact patient on mobile number to advise. Rhonda J Cobb

## 2013-09-11 ENCOUNTER — Ambulatory Visit: Payer: Medicare Other | Admitting: Pulmonary Disease

## 2013-09-18 ENCOUNTER — Ambulatory Visit (INDEPENDENT_AMBULATORY_CARE_PROVIDER_SITE_OTHER): Payer: Medicare Other | Admitting: Podiatry

## 2013-09-18 ENCOUNTER — Encounter: Payer: Self-pay | Admitting: Podiatry

## 2013-09-18 ENCOUNTER — Ambulatory Visit (INDEPENDENT_AMBULATORY_CARE_PROVIDER_SITE_OTHER): Payer: Medicare Other

## 2013-09-18 VITALS — BP 129/75 | HR 65 | Resp 16

## 2013-09-18 DIAGNOSIS — E1169 Type 2 diabetes mellitus with other specified complication: Secondary | ICD-10-CM

## 2013-09-18 DIAGNOSIS — L97509 Non-pressure chronic ulcer of other part of unspecified foot with unspecified severity: Secondary | ICD-10-CM

## 2013-09-18 DIAGNOSIS — E11621 Type 2 diabetes mellitus with foot ulcer: Secondary | ICD-10-CM

## 2013-09-18 DIAGNOSIS — L03119 Cellulitis of unspecified part of limb: Secondary | ICD-10-CM

## 2013-09-18 DIAGNOSIS — L02619 Cutaneous abscess of unspecified foot: Secondary | ICD-10-CM

## 2013-09-18 DIAGNOSIS — L97529 Non-pressure chronic ulcer of other part of left foot with unspecified severity: Secondary | ICD-10-CM

## 2013-09-18 MED ORDER — SULFAMETHOXAZOLE-TMP DS 800-160 MG PO TABS: 1.0000 | ORAL_TABLET | Freq: Two times a day (BID) | ORAL | Status: DC

## 2013-09-18 NOTE — Progress Notes (Signed)
Subjective:     Patient ID: Denise Sharp, female   DOB: 02-09-1946, 68 y.o.   MRN: [id number]  HPI patient points to plantar aspect left lateral foot stating she saw some wetness around this area and she was concerned that she may have an infection. Patient has diabetes and profound neuropathy and has had a history of bone removal on the left lateral foot   Review of Systems     Objective:   Physical Exam Neurovascular status diminished but unchanged from previous visit. Patient's left lateral foot around the fifth metatarsal base shows slight abrasion and erythema but no edema no drainage no proximal edema erythema or lymph node distention. Patient shows no systemic signs of infection and has normal temperature and no spike in glucose level    Assessment:     Possible abrasion of the left lateral foot with no current indication of tissue breakdown or active infection    Plan:     Patient is very concerned because of her history and as a precautionary measure we placed her on Septra DS to take for the next 10 days. She is to keep her appointment with Dr. Milinda Pointer on Monday and do visual inspection of foot and if it should become red swollen drain or any systemic signs of infection she will go straight to the emergency room

## 2013-09-18 NOTE — Progress Notes (Signed)
Left foot had a blister pop this morning and wanted to have it checked before the weekend

## 2013-09-21 ENCOUNTER — Ambulatory Visit: Payer: Medicare Other | Admitting: Podiatry

## 2013-09-22 ENCOUNTER — Ambulatory Visit: Payer: Self-pay | Admitting: Internal Medicine

## 2013-09-22 DIAGNOSIS — R918 Other nonspecific abnormal finding of lung field: Secondary | ICD-10-CM | POA: Diagnosis not present

## 2013-09-22 DIAGNOSIS — J984 Other disorders of lung: Secondary | ICD-10-CM | POA: Diagnosis not present

## 2013-09-22 DIAGNOSIS — E279 Disorder of adrenal gland, unspecified: Secondary | ICD-10-CM | POA: Diagnosis not present

## 2013-09-28 ENCOUNTER — Ambulatory Visit (INDEPENDENT_AMBULATORY_CARE_PROVIDER_SITE_OTHER): Payer: Medicare Other | Admitting: Podiatry

## 2013-09-28 ENCOUNTER — Telehealth: Payer: Self-pay | Admitting: Internal Medicine

## 2013-09-28 VITALS — BP 134/82 | HR 91 | Resp 16 | Ht 64.0 in | Wt 215.0 lb

## 2013-09-28 DIAGNOSIS — B351 Tinea unguium: Secondary | ICD-10-CM

## 2013-09-28 DIAGNOSIS — L97529 Non-pressure chronic ulcer of other part of left foot with unspecified severity: Secondary | ICD-10-CM

## 2013-09-28 DIAGNOSIS — L02619 Cutaneous abscess of unspecified foot: Secondary | ICD-10-CM

## 2013-09-28 DIAGNOSIS — E11621 Type 2 diabetes mellitus with foot ulcer: Secondary | ICD-10-CM

## 2013-09-28 DIAGNOSIS — M79609 Pain in unspecified limb: Secondary | ICD-10-CM

## 2013-09-28 DIAGNOSIS — L03119 Cellulitis of unspecified part of limb: Secondary | ICD-10-CM

## 2013-09-28 MED ORDER — CLINDAMYCIN HCL 150 MG PO CAPS: 150.0000 mg | ORAL_CAPSULE | Freq: Three times a day (TID) | ORAL | Status: DC

## 2013-09-28 MED ORDER — MUPIROCIN 2 % EX OINT: TOPICAL_OINTMENT | CUTANEOUS | Status: DC

## 2013-09-28 NOTE — Telephone Encounter (Signed)
Pt had CT done at [facility]. Please advise MW thanks

## 2013-09-28 NOTE — Progress Notes (Signed)
She presents today for routine nail debridement she also to take a look at her cellulitis and ulcerative lesions about the fifth metatarsal of her left foot.  Objective: Vital signs are stable she is alert and oriented x3 she continues to take Bactrim DS the Dr. Felisa Bonier prescribed. She's not treating this in any particular way. 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  Plan: Discussed etiology pathology conservative therapies at this point debridement nails 1 through 5 bilateral. Adding clindamycin to her Bactrim DS. Encouraged soaking in Epsom salts warm water and Bactroban ointment with dressing. Followup with her in 2-3 weeks

## 2013-09-29 NOTE — Telephone Encounter (Signed)
Called spoke with patient and advised of CT results / recs as stated by MW below.  Pt verbalized her understanding and denied any questions/concerns at this time.  She is aware she will be contact to schedule the CT in 6 months' time.  Nothing further needed at this time; will sign off.

## 2013-09-29 NOTE — Telephone Encounter (Signed)
Please advise MW thanks 

## 2013-09-29 NOTE — Telephone Encounter (Signed)
CT Chest was done at [facility]  I have sent a fax requesting results to be faxed to triage fax line  Will await records and hold in my basket until received

## 2013-09-29 NOTE — Telephone Encounter (Signed)
CT No change which is good news as probably just scar, rec recheck in 6 months to be complete  Already placed in tickle

## 2013-09-29 NOTE — Telephone Encounter (Signed)
Pt is now out of dentist office.  Please call with results to her cell # [phone].

## 2013-09-29 NOTE — Telephone Encounter (Signed)
Pt calling back for CT results from 3/10.  She states she is leaving now to go to [facility] for appt.  Please call her home # [phone] and leave message letting her know results.  She states she does not want to make appt with Dr just to get results.

## 2013-10-08 ENCOUNTER — Emergency Department: Payer: Self-pay | Admitting: Internal Medicine

## 2013-10-08 DIAGNOSIS — Z794 Long term (current) use of insulin: Secondary | ICD-10-CM | POA: Diagnosis not present

## 2013-10-08 DIAGNOSIS — Z9089 Acquired absence of other organs: Secondary | ICD-10-CM | POA: Diagnosis not present

## 2013-10-08 DIAGNOSIS — L97509 Non-pressure chronic ulcer of other part of unspecified foot with unspecified severity: Secondary | ICD-10-CM | POA: Diagnosis not present

## 2013-10-08 DIAGNOSIS — Z9079 Acquired absence of other genital organ(s): Secondary | ICD-10-CM | POA: Diagnosis not present

## 2013-10-08 DIAGNOSIS — E1069 Type 1 diabetes mellitus with other specified complication: Secondary | ICD-10-CM | POA: Diagnosis not present

## 2013-10-08 DIAGNOSIS — Z888 Allergy status to other drugs, medicaments and biological substances status: Secondary | ICD-10-CM | POA: Diagnosis not present

## 2013-10-08 DIAGNOSIS — I10 Essential (primary) hypertension: Secondary | ICD-10-CM | POA: Diagnosis not present

## 2013-10-08 DIAGNOSIS — E1169 Type 2 diabetes mellitus with other specified complication: Secondary | ICD-10-CM | POA: Diagnosis not present

## 2013-10-08 LAB — CBC WITH DIFFERENTIAL/PLATELET
Basophil #: 0.2 10*3/uL — ABNORMAL HIGH (ref 0.0–0.1)
Basophil %: 1.4 %
Eosinophil #: 0.1 10*3/uL (ref 0.0–0.7)
Eosinophil %: 0.8 %
HCT: 41.5 % (ref 35.0–47.0)
HGB: 13.4 g/dL (ref 12.0–16.0)
Lymphocyte #: 2.8 10*3/uL (ref 1.0–3.6)
Lymphocyte %: 23.3 %
MCH: 29.2 pg (ref 26.0–34.0)
MCHC: 32.3 g/dL (ref 32.0–36.0)
MCV: 90 fL (ref 80–100)
Monocyte #: 0.6 x10 3/mm (ref 0.2–0.9)
Monocyte %: 4.8 %
Neutrophil #: 8.4 10*3/uL — ABNORMAL HIGH (ref 1.4–6.5)
Neutrophil %: 69.7 %
Platelet: 313 10*3/uL (ref 150–440)
RBC: 4.59 10*6/uL (ref 3.80–5.20)
RDW: 14.8 % — ABNORMAL HIGH (ref 11.5–14.5)
WBC: 12.1 10*3/uL — ABNORMAL HIGH (ref 3.6–11.0)

## 2013-10-08 LAB — BASIC METABOLIC PANEL
Anion Gap: 6 — ABNORMAL LOW (ref 7–16)
BUN: 15 mg/dL (ref 7–18)
Calcium, Total: 9.3 mg/dL (ref 8.5–10.1)
Chloride: 110 mmol/L — ABNORMAL HIGH (ref 98–107)
Co2: 23 mmol/L (ref 21–32)
Creatinine: 0.9 mg/dL (ref 0.60–1.30)
EGFR (African American): >60
EGFR (Non-African Amer.): >60
Glucose: 134 mg/dL — ABNORMAL HIGH (ref 65–99)
Osmolality: 280 (ref 275–301)
Potassium: 4.5 mmol/L (ref 3.5–5.1)
Sodium: 139 mmol/L (ref 136–145)

## 2013-10-12 ENCOUNTER — Ambulatory Visit (INDEPENDENT_AMBULATORY_CARE_PROVIDER_SITE_OTHER): Payer: Medicare Other | Admitting: Podiatry

## 2013-10-12 VITALS — Resp 16 | Ht 64.0 in | Wt 217.4 lb

## 2013-10-12 DIAGNOSIS — E1169 Type 2 diabetes mellitus with other specified complication: Secondary | ICD-10-CM

## 2013-10-12 DIAGNOSIS — L97509 Non-pressure chronic ulcer of other part of unspecified foot with unspecified severity: Secondary | ICD-10-CM

## 2013-10-12 DIAGNOSIS — E11621 Type 2 diabetes mellitus with foot ulcer: Secondary | ICD-10-CM

## 2013-10-12 DIAGNOSIS — M79609 Pain in unspecified limb: Secondary | ICD-10-CM

## 2013-10-12 DIAGNOSIS — L97529 Non-pressure chronic ulcer of other part of left foot with unspecified severity: Secondary | ICD-10-CM

## 2013-10-12 NOTE — Progress Notes (Signed)
She presents today as a 68 year old white female with a chief complaint of an ulcerative lesion to the lateral aspect of the fifth metatarsal base left foot. This has been open for quite some time. She states that she went to the ED last week and received no treatment. She received no IV antibiotics and was complaining of severe pain to her left foot. She relates an increase in her hypertension a normal blood sugars. She denies fever chills nausea and vomiting. She's recently finished her by mouth antibiotics.  Objective: Vital signs are stable she is alert and oriented x3. I have reviewed her past medical history medications and allergies. Pulses are barely palpable left lower extremity. Capillary fill time is immediate. Ulceration fifth metatarsal base left foot measures 1.2 cm in diameter. It appears to be quite superficial with fine granulation tissue. She does have surrounding erythema and no purulence and no odor. The wound does not probe to bone but she has severe pain surrounding the ulcerative lesion. There does appear to be an area of cellulitis extending proximally but not to the ankle.  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  Plan: At this point we seem to be making headway. I think the best thing to do would be to continue all conservative therapies and off weightbearing. I am also requesting we sent her to the wound care clinic because of the lack of completion with this wound. A dry sterile compressive dressing was applied today.

## 2013-10-13 ENCOUNTER — Encounter: Payer: Self-pay | Admitting: Surgery

## 2013-10-13 DIAGNOSIS — S91309A Unspecified open wound, unspecified foot, initial encounter: Secondary | ICD-10-CM | POA: Diagnosis not present

## 2013-10-13 DIAGNOSIS — E139 Other specified diabetes mellitus without complications: Secondary | ICD-10-CM | POA: Diagnosis not present

## 2013-10-14 ENCOUNTER — Encounter: Payer: Self-pay | Admitting: Surgery

## 2013-10-14 DIAGNOSIS — S91309A Unspecified open wound, unspecified foot, initial encounter: Secondary | ICD-10-CM | POA: Diagnosis not present

## 2013-10-14 DIAGNOSIS — E139 Other specified diabetes mellitus without complications: Secondary | ICD-10-CM | POA: Diagnosis not present

## 2013-10-20 DIAGNOSIS — S91309A Unspecified open wound, unspecified foot, initial encounter: Secondary | ICD-10-CM | POA: Diagnosis not present

## 2013-10-20 DIAGNOSIS — E139 Other specified diabetes mellitus without complications: Secondary | ICD-10-CM | POA: Diagnosis not present

## 2013-10-22 ENCOUNTER — Other Ambulatory Visit: Payer: Self-pay | Admitting: *Deleted

## 2013-10-22 MED ORDER — FLUCONAZOLE 150 MG PO TABS: 150.0000 mg | ORAL_TABLET | Freq: Once | ORAL | Status: DC

## 2013-10-22 NOTE — Telephone Encounter (Signed)
Pt called requesting diflucan for a yeast infection due to antibiotics dr Milinda Pointer has her on. Sent in diflucan 150 mg qty 1 0 refills [facility] [address]

## 2013-10-26 LAB — WOUND AEROBIC CULTURE

## 2013-10-27 DIAGNOSIS — J4489 Other specified chronic obstructive pulmonary disease: Secondary | ICD-10-CM | POA: Diagnosis not present

## 2013-10-27 DIAGNOSIS — S91309A Unspecified open wound, unspecified foot, initial encounter: Secondary | ICD-10-CM | POA: Diagnosis not present

## 2013-10-27 DIAGNOSIS — E119 Type 2 diabetes mellitus without complications: Secondary | ICD-10-CM | POA: Diagnosis not present

## 2013-10-27 DIAGNOSIS — L97509 Non-pressure chronic ulcer of other part of unspecified foot with unspecified severity: Secondary | ICD-10-CM | POA: Diagnosis not present

## 2013-10-27 DIAGNOSIS — E139 Other specified diabetes mellitus without complications: Secondary | ICD-10-CM | POA: Diagnosis not present

## 2013-10-27 DIAGNOSIS — J449 Chronic obstructive pulmonary disease, unspecified: Secondary | ICD-10-CM | POA: Diagnosis not present

## 2013-11-03 IMAGING — CT CT CHEST-ABD W/ CM
1 of 2 series · 14 of 32 positions shown, 19 images · non-contrast
Comparison: none

REASON FOR EXAM: Diaphagmatic Hernia
COMMENTS:

PROCEDURE:     CT  - CT CHEST AND ABDOMEN W  - February 27, 2013  [DATE]
RESULT:
TECHNIQUE: Helical 3 mm sections were obtained from the thoracic inlet
through the superior iliac crest status post intravenous administration of
100 ml of [id number].

[Series 3: soft tissue · axial · 0.86mm/px · z∈[-727,-331]mm · 14 of 150 slices shown, 19 images]
[im 9/150  soft-tissue]
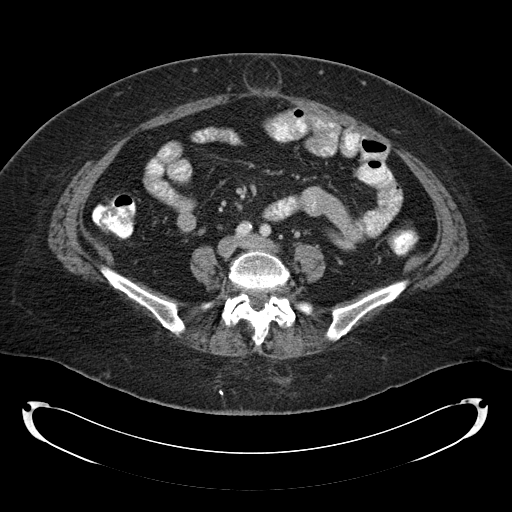
[im 9/150  bone]
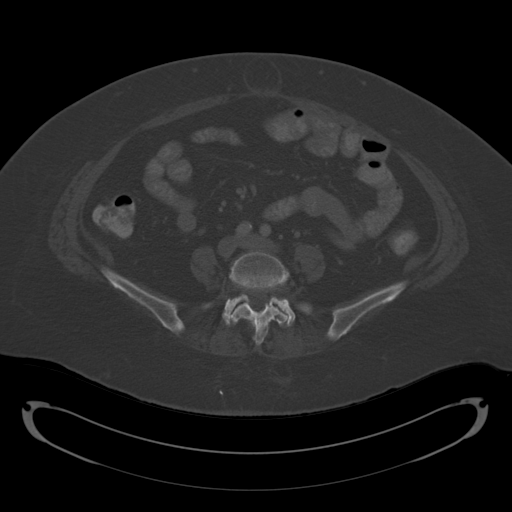
[im 17/150  soft-tissue]
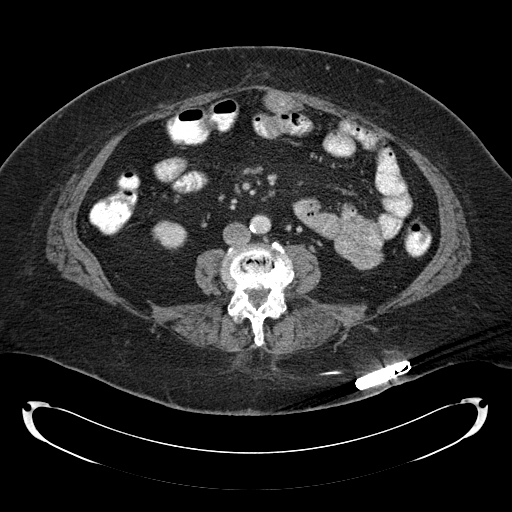
[im 34/150  soft-tissue]
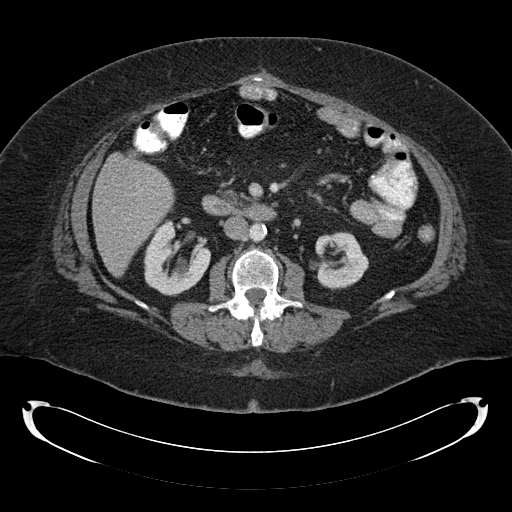
[im 42/150  soft-tissue]
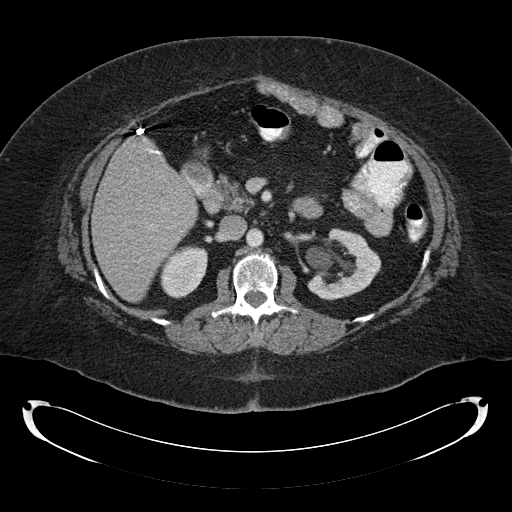
[im 50/150  soft-tissue]
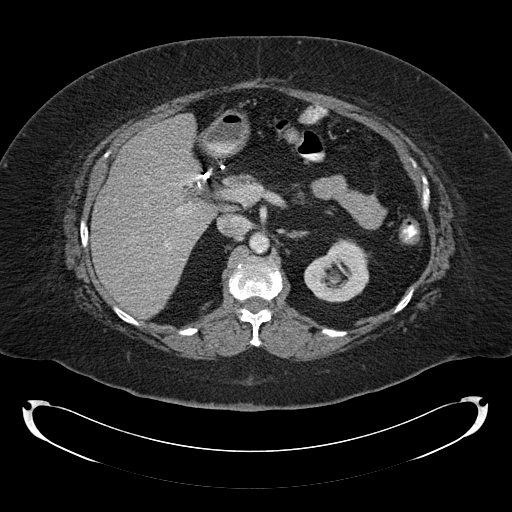
[im 67/150  soft-tissue]
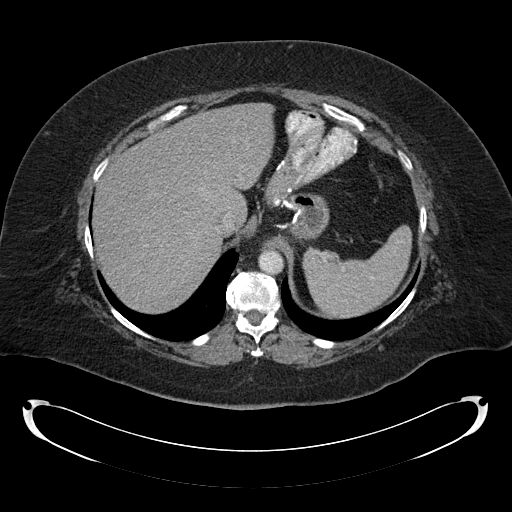
[im 75/150  soft-tissue]
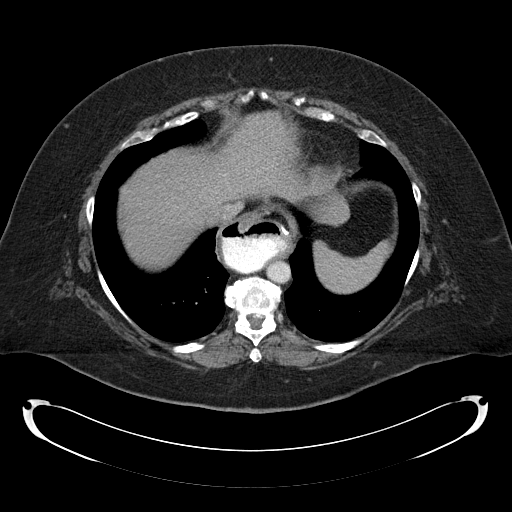
[im 83/150  soft-tissue]
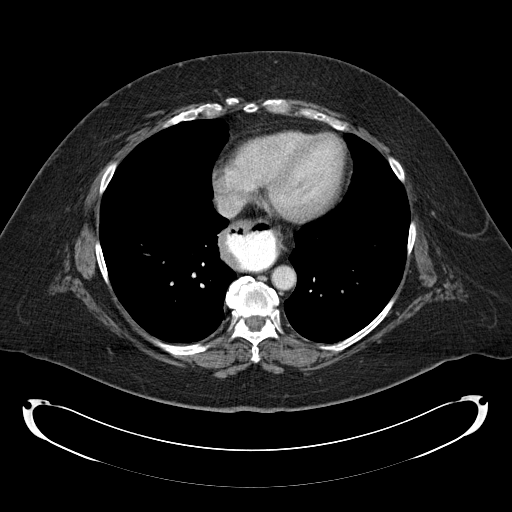
[im 100/150  soft-tissue]
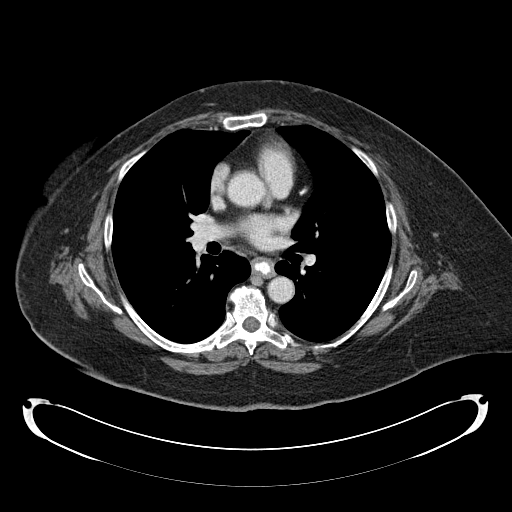
[im 100/150  bone]
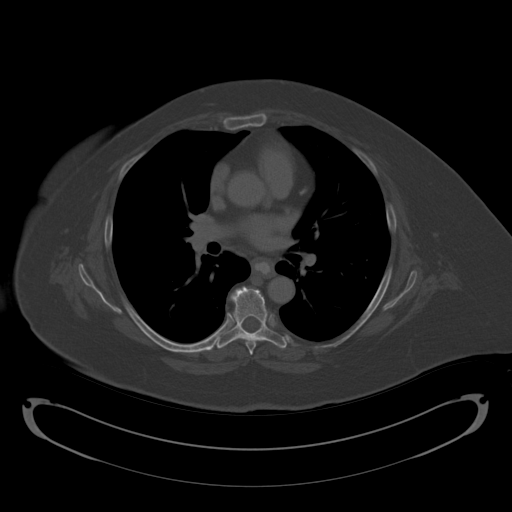
[im 108/150  soft-tissue]
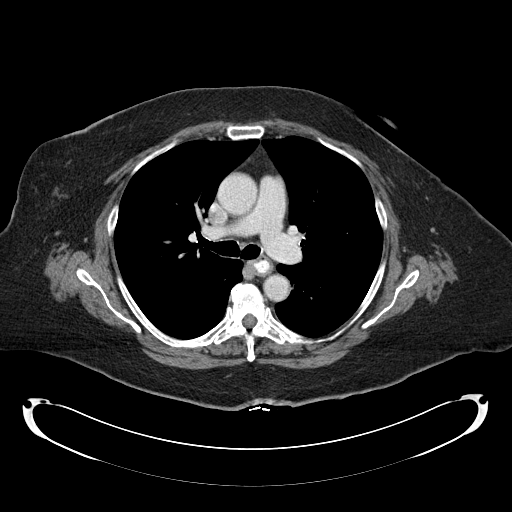
[im 116/150  soft-tissue]
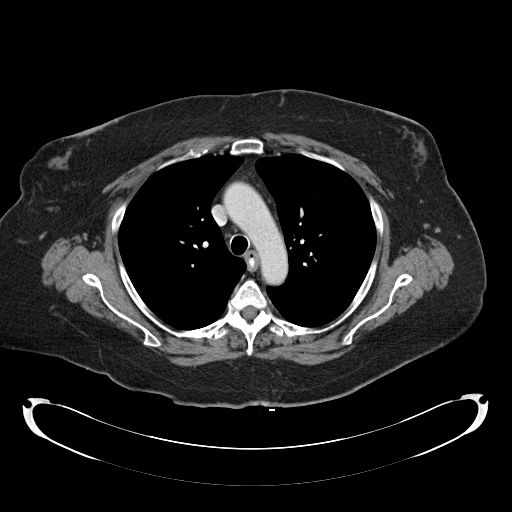
[im 116/150  lung]
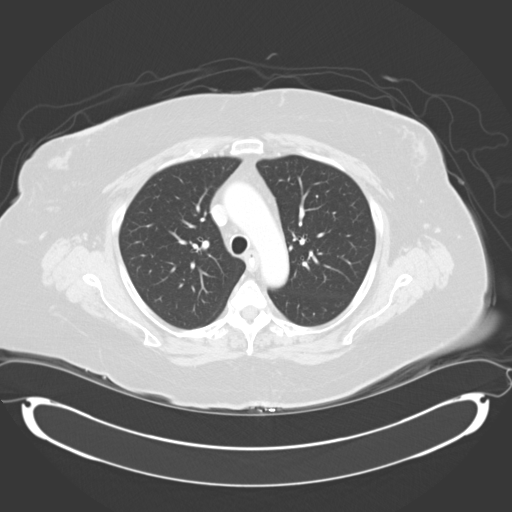
[im 125/150  lung]
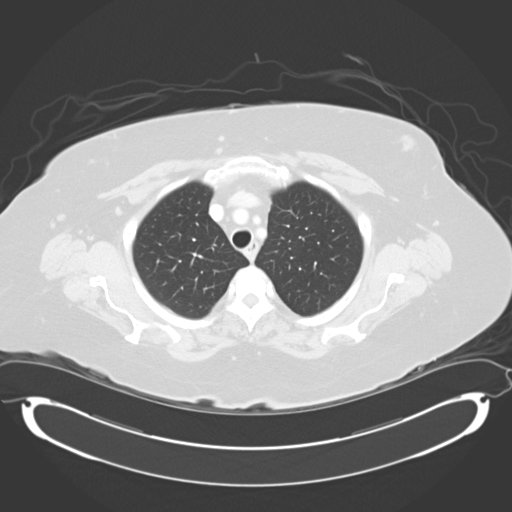
[im 133/150  soft-tissue]
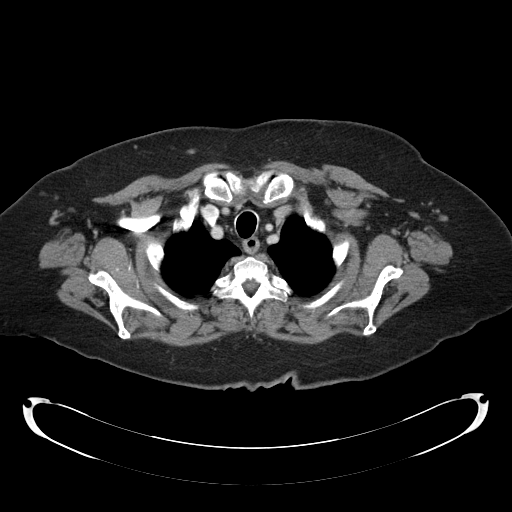
[im 133/150  lung]
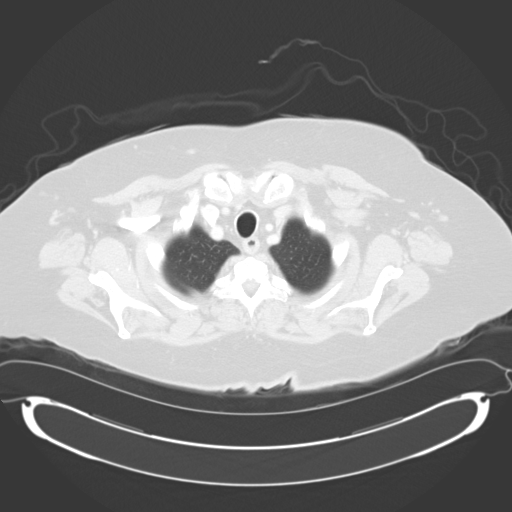
[im 141/150  soft-tissue]
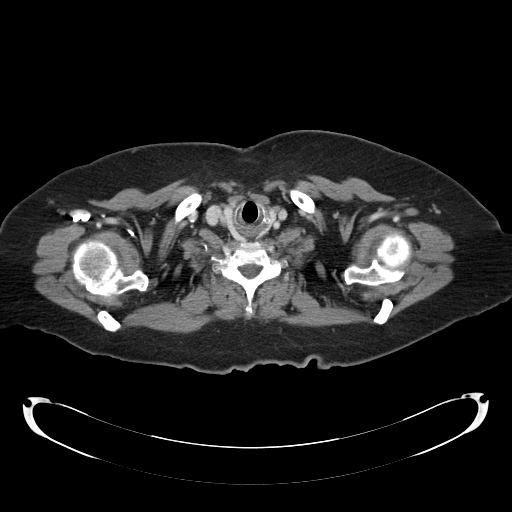
[im 141/150  lung]
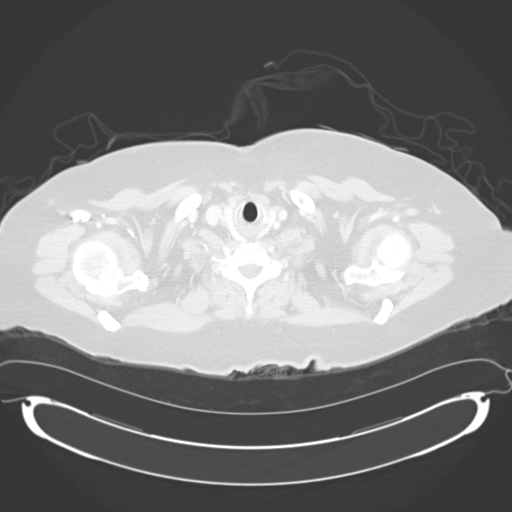

[14 of 32 positions shown; findings below may reference images not displayed]

FINDINGS: Evaluation of the mediastinum and hilar regions and structures
demonstrates a hiatal hernia which is moderate in size. There appears to be
a paraesophageal component. There is no further evidence of mediastinal or
hilar masses or adenopathy.

The lung parenchyma demonstrates areas of ground-glass appearing nodular
density within the anterolateral aspect of the left upper lobe, appreciated
on image #28 of the lung series. The more anterior nodule measures 1.1 x
cm in AP x transverse dimensions on image #29 of the lung series. The more
ill-defined posterior nodule on image #28 measures 0.77 cm in diameter.

Abdomen:  Post surgical changes are identified consistent with gastric
bypass. The liver, spleen, right adrenal, and pancreas are unremarkable. The
patient is status post cholecystectomy. There is no evidence of abdominal
free fluid, loculated fluid collections, masses or adenopathy. A fat
containing umbilical hernia is appreciated. Within the ventral abdomen, just
below the level of the umbilical hernia, is a region of increased density in
the abdominal wall which may represent the sequela of prior hernia repair.
An electronic subcutaneous unit is appreciated within the posterior aspect
of the supragluteus area on the left.

There is no evidence of an abdominal aortic aneurysm or dissection.

Note, not mentioned above, a very vague area of increased density projects
within the periphery of the lingular base.
IMPRESSION: 1.  Hiatal hernia with mild paraesophageal components.
2.  Indeterminate ground-glass nodules in the left upper lobe and mild area
of increased density within the lingula. Surveillance evaluation recommended.
4.  An indeterminate, left adrenal nodule is appreciated measuring 1.72 cm
in diameter.
5.  Small, fat containing, umbilical hernia.

## 2013-11-10 DIAGNOSIS — E139 Other specified diabetes mellitus without complications: Secondary | ICD-10-CM | POA: Diagnosis not present

## 2013-11-10 DIAGNOSIS — S91309A Unspecified open wound, unspecified foot, initial encounter: Secondary | ICD-10-CM | POA: Diagnosis not present

## 2013-11-11 ENCOUNTER — Telehealth: Payer: Self-pay | Admitting: Internal Medicine

## 2013-11-11 DIAGNOSIS — F419 Anxiety disorder, unspecified: Secondary | ICD-10-CM

## 2013-11-11 DIAGNOSIS — F5105 Insomnia due to other mental disorder: Secondary | ICD-10-CM

## 2013-11-11 MED ORDER — ESZOPICLONE 2 MG PO TABS: 2.0000 mg | ORAL_TABLET | Freq: Every day | ORAL | Status: DC

## 2013-11-11 MED ORDER — DIAZEPAM 10 MG PO TABS: 10.0000 mg | ORAL_TABLET | Freq: Every evening | ORAL | Status: DC | PRN

## 2013-11-11 MED ORDER — HYDRALAZINE HCL 25 MG PO TABS: 25.0000 mg | ORAL_TABLET | Freq: Three times a day (TID) | ORAL | Status: DC

## 2013-11-11 MED ORDER — NEBIVOLOL HCL 5 MG PO TABS: 5.0000 mg | ORAL_TABLET | Freq: Every day | ORAL | Status: DC

## 2013-11-11 NOTE — Telephone Encounter (Signed)
Left message for patient to return call.

## 2013-11-11 NOTE — Telephone Encounter (Signed)
Patient called needing medication refill sent to [facility], Lunesta, bystolic, Hydralazine, and diazepam. Patient stated she is almost out of Hydralazine, after reading sig ask patient how often she is taking patient stated BID everyday, the way I read sig is use up to three times daily if BP is over 160? Please advise on refills.

## 2013-11-11 NOTE — Telephone Encounter (Signed)
meds refilled,  hydralzine is tid prn bp > 160

## 2013-11-11 NOTE — Telephone Encounter (Signed)
Pt returned call.  Advised of previous ph note.  Pt agrees.

## 2013-11-12 LAB — HM DIABETES EYE EXAM

## 2013-11-13 ENCOUNTER — Encounter: Payer: Self-pay | Admitting: Surgery

## 2013-11-13 DIAGNOSIS — S91309A Unspecified open wound, unspecified foot, initial encounter: Secondary | ICD-10-CM | POA: Diagnosis not present

## 2013-11-13 DIAGNOSIS — E139 Other specified diabetes mellitus without complications: Secondary | ICD-10-CM | POA: Diagnosis not present

## 2013-11-17 DIAGNOSIS — E139 Other specified diabetes mellitus without complications: Secondary | ICD-10-CM | POA: Diagnosis not present

## 2013-11-17 DIAGNOSIS — S91309A Unspecified open wound, unspecified foot, initial encounter: Secondary | ICD-10-CM | POA: Diagnosis not present

## 2013-11-19 NOTE — Assessment & Plan Note (Signed)
Managed with CPAP.  Last sleep study was about in 2014.   She is wearing her CPAP every night a minimum of 6 hours per night.

## 2013-11-23 ENCOUNTER — Ambulatory Visit (INDEPENDENT_AMBULATORY_CARE_PROVIDER_SITE_OTHER): Payer: Medicare Other | Admitting: Nurse Practitioner

## 2013-11-23 ENCOUNTER — Encounter: Payer: Self-pay | Admitting: Nurse Practitioner

## 2013-11-23 VITALS — BP 131/72 | HR 69 | Ht 64.0 in | Wt 233.0 lb

## 2013-11-23 DIAGNOSIS — E1142 Type 2 diabetes mellitus with diabetic polyneuropathy: Secondary | ICD-10-CM

## 2013-11-23 DIAGNOSIS — R269 Unspecified abnormalities of gait and mobility: Secondary | ICD-10-CM

## 2013-11-23 DIAGNOSIS — G2581 Restless legs syndrome: Secondary | ICD-10-CM

## 2013-11-23 DIAGNOSIS — R519 Headache, unspecified: Secondary | ICD-10-CM | POA: Insufficient documentation

## 2013-11-23 DIAGNOSIS — R51 Headache: Secondary | ICD-10-CM | POA: Diagnosis not present

## 2013-11-23 NOTE — Progress Notes (Signed)
[facility]  PATIENT: Denise Sharp DOB: 04-02-1946   REASON FOR VISIT: Followup for headaches and neuropathy   HISTORY OF PRESENT ILLNESS: Denise Sharp, 68 year old returns for followup. She was last seen by Dr. Jannifer Franklin 03/25/2013. 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 She returns for reevaluation. She has not had recent falls. She currently has her left foot in a special shoe due to a diabetic foot wound.   HISTORY:  of diabetes, neuropathy, cervicogenic headache, and obesity. The patient will be undergoing surgery in the near future for a hiatal hernia and an umbilical hernia. The patient gets some benefit with tizanidine with her headaches. The patient is on Ultram if needed. The patient returns for an evaluation. The patient is usually able to ambulate independently, occasionally she will use a scooter for long-distance travel. The patient reports no other new medical issues that have come up since last seen.    REVIEW OF SYSTEMS: Full 14 system review of systems performed and notable only for those listed, all others are neg:  Constitutional: fatigue Cardiovascular: Leg swelling Ear/Nose/Throat: N/A  Skin: N/A  Eyes: N/A  Respiratory: N/A  Gastroitestinal: Irritable bowel Hematology/Lymphatic: N/A  Endocrine: N/A Musculoskeletal: Joint pain Allergy/Immunology: N/A  Neurological: Neuropathy  Psychiatric: N/A Sleep : NA   ALLERGIES: Allergies  Allergen Reactions  . Diovan [Valsartan]   . Erythromycin   . Flagyl [Metronidazole]   . Glucophage [Metformin Hcl]   . Xanax [Alprazolam]   . Demeclocycline Hives  . Erythromycin Base   . Lasix [Furosemide] Swelling    Has opposite effect  . Metformin   . Sulfa Antibiotics Rash    As child  . Sulfa Drugs Cross  Reactors Rash  . Tetracyclines & Related Hives and Rash    HOME MEDICATIONS: Outpatient Prescriptions Prior to Visit  Medication Sig Dispense Refill  . cyanocobalamin (,VITAMIN B-12,) 1000 MCG/ML injection 1000 ml (1 ml) injection twice monthly  25 mL  3  . cyanocobalamin (,VITAMIN B-12,) 1000 MCG/ML injection 1000 ml (1 ml) injection twice monthly      . diazepam (VALIUM) 10 MG tablet Take 1 tablet (10 mg total) by mouth at bedtime as needed for anxiety.  90 tablet  1  . dicyclomine (BENTYL) 10 MG/5ML syrup Take 5 mLs (10 mg total) by mouth 4 (four) times daily -  before meals and at bedtime.  1440 mL  3  . dicyclomine (BENTYL) 10 MG/5ML syrup Take 10 mg by mouth.      . ergocalciferol (DRISDOL) 50000 UNITS capsule Take 1 capsule (50,000 Units total) by mouth once a week.  12 capsule  0  . ergocalciferol (DRISDOL) 50000 UNITS capsule Take by mouth.      . estradiol (ESTRACE) 0.5 MG tablet Take 1 tablet (0.5 mg total) by mouth daily.  90 tablet  2  . eszopiclone (LUNESTA) 2 MG TABS tablet Take 1 tablet (2 mg total) by mouth at bedtime. Take immediately before bedtime  90 tablet  1  . fluconazole (DIFLUCAN) 150 MG tablet Take 1 tablet (150 mg total) by mouth once.  1 tablet  0  . Glucose Blood (BAYER BREEZE 2 TEST) DISK Use one test strip each time glucose needs testing.  50 each  2  . Glucose Blood (BAYER BREEZE 2 TEST) DISK Use  one test strip each time glucose needs testing.      . hydrALAZINE (APRESOLINE) 25 MG tablet Take 1 tablet (25 mg total) by mouth 3 (three) times daily. As needed for bp > 160  270 tablet  1  . hydrocodone-acetaminophen (LORCET-HD) 5-500 MG per capsule Take 1 capsule by mouth every 6 (six) hours as needed.  30 capsule  3  . Insulin Aspart Prot & Aspart (NOVOLOG MIX 70/30 FLEXPEN J[place]) Inject into the skin as needed. Sliding scale      . Insulin Aspart Prot & Aspart (NOVOLOG MIX 70/30 FLEXPEN) (70-30) 100 UNIT/ML Pen Inject into the skin.      . Loperamide HCl (IMODIUM  PO) As needed      . losartan (COZAAR) 50 MG tablet       . mupirocin ointment (BACTROBAN) 2 % Apply to affected area once or twice daily after soaking.  22 g  2  . nebivolol (BYSTOLIC) 5 MG tablet Take 1 tablet (5 mg total) by mouth daily.  90 tablet  1  . oxyCODONE-acetaminophen (PERCOCET/ROXICET) 5-325 MG per tablet Take 1 tablet by mouth every 6 (six) hours as needed.  30 tablet  0  . oxyCODONE-acetaminophen (PERCOCET/ROXICET) 5-325 MG per tablet Take by mouth as needed.       . pregabalin (LYRICA) 75 MG capsule Take 75 mg by mouth 2 (two) times daily.      [name] rOPINIRole (REQUIP) 1 MG tablet Take 1 tablet (1 mg total) by mouth at bedtime.  90 tablet  2  . sitaGLIPtin (JANUVIA) 100 MG tablet Take 1 tablet (100 mg total) by mouth daily.  90 tablet  2  . tizanidine (ZANAFLEX) 2 MG capsule Take 1 capsule (2 mg total) by mouth 3 (three) times daily as needed for muscle spasms.  270 capsule  1  . traMADol (ULTRAM) 50 MG tablet Take 50 mg by mouth 4 (four) times daily as needed.      . clindamycin (CLEOCIN) 150 MG capsule Take 1 capsule (150 mg total) by mouth 3 (three) times daily.  30 capsule  0  . predniSONE (STERAPRED UNI-PAK) 10 MG tablet 6 tablets on Day 1 , then reduce by 1 tablet daily until gone  21 tablet  0   No facility-administered medications prior to visit.    PAST MEDICAL HISTORY: Past Medical History  Diagnosis Date  . Diabetes mellitus   . Depression   . Hypertension   . Cervical spinal stenosis 1994    due to trauma to back ([employer] accident), has intermittent paralysis and parasthesias  . Adrenal mass   . IBS (irritable bowel syndrome)   . Obstructive sleep apnea     using CPAP  . Anemia     iron deficient post  2 unit txfsn 2009, normal endo/colonoscopy by [facility]  . Esophageal stenosis March 2011    with transietn outlet obstruction by food, cleared by EGD   . LBBB (left bundle branch block)   . Obesity   . Apnea   . Headache(784.0)   . Arthritis   . Restless leg  syndrome   . Dizziness     chronic dizziness  . Adrenal mass, left   . 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 Abnormality of gait 03/25/2013    PAST SURGICAL HISTORY: Past Surgical History  Procedure Laterality Date  . Gastric bypass    . Abdominal hysterectomy    . Gastric bypass  2000, 2005    Dr. Debroah Loop  .  Appendectomy    . Gallbladder surgery  1976  . Rotator cuff repair      right  . Joint replacement  2007    bilateral knee. [name],  [name]  . Spine surgery  1995    [place]  . Interstim implant placement  April 2013    [place]  . Gallbladder surgery  resection    FAMILY HISTORY: Family History  Problem Relation Age of Onset  . Heart disease Father   . Hypertension Father   . Prostate cancer Father   . Stroke Mother   . Depression Mother   . Headache Mother     SOCIAL HISTORY: History   Social History  . Marital Status: Married    Spouse Name: [name]     Number of Children: 2  . Years of Education: College    Occupational History  .     Social History Main Topics  . Smoking status: Never Smoker   . Smokeless tobacco: Never Used  . Alcohol Use: No  . Drug Use: No  . Sexual Activity: Not Currently   Other Topics Concern  . Not on file   Social History Narrative   Patient lives at home with husband [name].    Patient has 2 children.    Patient has [employer].    Patient is on disability.    Patient is right handed.      PHYSICAL EXAM  Filed Vitals:   11/23/13 1422  BP: 131/72  Pulse: 69  Height: 5\' 4"  (1.626 m)  Weight: 233 lb (105.688 kg)   Body mass index is 39.97 kg/(m^2).  Generalized: Well developed, morbidly obese female in no acute distress  Head: normocephalic and atraumatic,. Oropharynx benign  Neck: Supple, no carotid bruits  Cardiac: Regular rate rhythm, no murmur  Musculoskeletal: No deformity   Neurological examination   Mentation: Alert oriented to time, place, history taking. Follows all commands speech  and language fluent  Cranial nerve II-XII: Pupils were equal round reactive to light extraocular movements were full, visual field were full on confrontational test. Facial sensation and strength were normal. hearing was intact to finger rubbing bilaterally. Uvula tongue midline. head turning and shoulder shrug were normal and symmetric.Tongue protrusion into cheek strength was normal. Motor: normal bulk and tone, full strength in the BUE, BLE,  No focal weakness Sensory: Decreased pinprick to above the knee, decreased vibratory to her ankle normal in the upper extremities  Coordination: finger-nose-finger, heel-to-shin bilaterally, no dysmetria Reflexes: Symmetric upper and lower but depressed, absent ankle jerks, plantar responses were flexor bilaterally. Gait and Station: Rising up from seated position without assistance, wide based  stance,  moderate stride, good arm swing, smooth turning, Tandem gait is unsteady  DIAGNOSTIC DATA (LABS, IMAGING, TESTING) - I reviewed patient records, labs, notes, testing and imaging myself where available.  Lab Results  Component Value Date   WBC 12.1* 08/04/2013   HGB 12.7 08/04/2013   HCT 37.9 08/04/2013   MCV 86.4 08/04/2013   PLT 287.0 08/04/2013      Component Value Date/Time   NA 140 08/25/2013 1308   K 4.9 08/25/2013 1308   CL 108 08/25/2013 1308   CO2 23 08/25/2013 1308   GLUCOSE 106* 08/25/2013 1308   BUN 14 08/25/2013 1308   CREATININE 0.8 08/25/2013 1308   CREATININE 0.87 12/13/2011 0919   CALCIUM 9.0 08/25/2013 1308   PROT 6.6 08/25/2013 1308   ALBUMIN 3.5 08/25/2013 1308   AST 22 08/25/2013 1308  ALT 12 08/25/2013 1308   ALKPHOS 122* 08/25/2013 1308   BILITOT 0.7 08/25/2013 1308   GFRNONAA 70 12/13/2011 0919   GFRAA 80 12/13/2011 0919    Lab Results  Component Value Date   HGBA1C 6.7* 08/25/2013   Lab Results  Component Value Date   VITAMINB12 962* 08/04/2013   Lab Results  Component Value Date   TSH 2.97 08/04/2013     ASSESSMENT AND  PLAN  68 y.o. year old female  has a past medical history of Diabetes mellitus;  Cervical spinal stenosis (1994);  Obesity; Apnea; Headache(784.0); s; Restless leg syndrome;  Polyneuropathy in diabetes(357.2) (03/25/2013); and Abnormality of gait (03/25/2013). here to followup.  Continue Lyrica for neuropathy gets refills from PCP Tizanidine  as needed for headache Followup in 6-8 months and when necessary Dennie Bible, [facility], [facility], [facility] [address], [place], [place] [address] [phone]

## 2013-11-23 NOTE — Patient Instructions (Signed)
Continue Lyrica for neuropathy Tizanidine  as needed for headache Followup in 6-8 months and when necessary

## 2013-11-23 NOTE — Progress Notes (Signed)
I have read the note, and I agree with the clinical assessment and plan.  Treonna Klee K Kollins Fenter   

## 2013-11-24 DIAGNOSIS — E139 Other specified diabetes mellitus without complications: Secondary | ICD-10-CM | POA: Diagnosis not present

## 2013-11-24 DIAGNOSIS — S91309A Unspecified open wound, unspecified foot, initial encounter: Secondary | ICD-10-CM | POA: Diagnosis not present

## 2013-11-27 LAB — WOUND AEROBIC CULTURE

## 2013-12-01 ENCOUNTER — Ambulatory Visit: Payer: Medicare Other | Admitting: Podiatry

## 2013-12-01 ENCOUNTER — Other Ambulatory Visit: Payer: Self-pay | Admitting: *Deleted

## 2013-12-01 DIAGNOSIS — L97509 Non-pressure chronic ulcer of other part of unspecified foot with unspecified severity: Secondary | ICD-10-CM

## 2013-12-01 DIAGNOSIS — S91309A Unspecified open wound, unspecified foot, initial encounter: Secondary | ICD-10-CM | POA: Diagnosis not present

## 2013-12-01 DIAGNOSIS — E139 Other specified diabetes mellitus without complications: Secondary | ICD-10-CM | POA: Diagnosis not present

## 2013-12-02 ENCOUNTER — Ambulatory Visit (INDEPENDENT_AMBULATORY_CARE_PROVIDER_SITE_OTHER): Payer: Medicare Other | Admitting: Podiatry

## 2013-12-02 VITALS — BP 138/77 | HR 78 | Resp 16

## 2013-12-02 DIAGNOSIS — L97509 Non-pressure chronic ulcer of other part of unspecified foot with unspecified severity: Secondary | ICD-10-CM | POA: Diagnosis not present

## 2013-12-02 DIAGNOSIS — L97529 Non-pressure chronic ulcer of other part of left foot with unspecified severity: Secondary | ICD-10-CM

## 2013-12-02 DIAGNOSIS — E1169 Type 2 diabetes mellitus with other specified complication: Secondary | ICD-10-CM

## 2013-12-02 DIAGNOSIS — E11621 Type 2 diabetes mellitus with foot ulcer: Secondary | ICD-10-CM

## 2013-12-02 NOTE — Progress Notes (Signed)
She presents today for followup of her ulceration to the lateral fifth metatarsal base of the left foot. She does doing quite well with wound care at the wound care seems to think they have. I didn't. He states that the wound has not made progress in 3 weeks. This patient was confused about what hyperbaric pressure and oxygen he has. She thought she would have to be submerged under water.  Objective: Pulses are still palpable left foot. Fifth metatarsal base is very prominent mild erythema surrounding the wound but the wound has decreased in size considerably since I saw him last. There is no sign of infection at this point.  Assessment: Well-healing ulcerative lesion left foot.  Plan: I would suggest we continue conservative therapies with the wound care center and possibly consider adding hyperbaric oxygen.

## 2013-12-03 ENCOUNTER — Telehealth: Payer: Self-pay | Admitting: Internal Medicine

## 2013-12-03 ENCOUNTER — Ambulatory Visit (INDEPENDENT_AMBULATORY_CARE_PROVIDER_SITE_OTHER): Payer: Medicare Other | Admitting: Internal Medicine

## 2013-12-03 ENCOUNTER — Ambulatory Visit: Payer: Self-pay | Admitting: Internal Medicine

## 2013-12-03 ENCOUNTER — Encounter: Payer: Self-pay | Admitting: Internal Medicine

## 2013-12-03 VITALS — BP 120/70 | HR 79 | Temp 98.3°F | Resp 16 | Ht 64.0 in | Wt 234.2 lb

## 2013-12-03 DIAGNOSIS — D3500 Benign neoplasm of unspecified adrenal gland: Secondary | ICD-10-CM | POA: Diagnosis not present

## 2013-12-03 DIAGNOSIS — R52 Pain, unspecified: Secondary | ICD-10-CM | POA: Diagnosis not present

## 2013-12-03 DIAGNOSIS — K625 Hemorrhage of anus and rectum: Secondary | ICD-10-CM | POA: Insufficient documentation

## 2013-12-03 DIAGNOSIS — K429 Umbilical hernia without obstruction or gangrene: Secondary | ICD-10-CM | POA: Diagnosis not present

## 2013-12-03 DIAGNOSIS — R0989 Other specified symptoms and signs involving the circulatory and respiratory systems: Secondary | ICD-10-CM

## 2013-12-03 DIAGNOSIS — R635 Abnormal weight gain: Secondary | ICD-10-CM | POA: Diagnosis not present

## 2013-12-03 DIAGNOSIS — R1031 Right lower quadrant pain: Secondary | ICD-10-CM | POA: Diagnosis not present

## 2013-12-03 DIAGNOSIS — R0609 Other forms of dyspnea: Secondary | ICD-10-CM | POA: Diagnosis not present

## 2013-12-03 DIAGNOSIS — Z9089 Acquired absence of other organs: Secondary | ICD-10-CM | POA: Diagnosis not present

## 2013-12-03 DIAGNOSIS — Z9884 Bariatric surgery status: Secondary | ICD-10-CM | POA: Insufficient documentation

## 2013-12-03 DIAGNOSIS — R197 Diarrhea, unspecified: Secondary | ICD-10-CM

## 2013-12-03 DIAGNOSIS — E278 Other specified disorders of adrenal gland: Secondary | ICD-10-CM | POA: Diagnosis not present

## 2013-12-03 DIAGNOSIS — R0600 Dyspnea, unspecified: Secondary | ICD-10-CM | POA: Insufficient documentation

## 2013-12-03 LAB — CBC
HCT: 40.2 % (ref 36.0–46.0)
Hemoglobin: 13.3 g/dL (ref 12.0–15.0)
MCHC: 33 g/dL (ref 30.0–36.0)
MCV: 88.3 fl (ref 78.0–100.0)
Platelets: 296 10*3/uL (ref 150.0–400.0)
RBC: 4.55 Mil/uL (ref 3.87–5.11)
RDW: 15 % (ref 11.5–15.5)
WBC: 10.1 10*3/uL (ref 4.0–10.5)

## 2013-12-03 LAB — COMPREHENSIVE METABOLIC PANEL
ALT: 20 U/L (ref 0–35)
AST: 28 U/L (ref 0–37)
Albumin: 3.6 g/dL (ref 3.5–5.2)
Alkaline Phosphatase: 116 U/L (ref 39–117)
BUN: 13 mg/dL (ref 6–23)
CO2: 25 mEq/L (ref 19–32)
Calcium: 9.2 mg/dL (ref 8.4–10.5)
Chloride: 106 mEq/L (ref 96–112)
Creatinine, Ser: 0.9 mg/dL (ref 0.4–1.2)
GFR: 67 mL/min (ref >60.00)
Glucose, Bld: 133 mg/dL — ABNORMAL HIGH (ref 70–99)
Potassium: 3.9 mEq/L (ref 3.5–5.1)
Sodium: 140 mEq/L (ref 135–145)
Total Bilirubin: 0.3 mg/dL (ref 0.2–1.2)
Total Protein: 7.6 g/dL (ref 6.0–8.3)

## 2013-12-03 LAB — BRAIN NATRIURETIC PEPTIDE: Pro B Natriuretic peptide (BNP): 80 pg/mL (ref 0.0–100.0)

## 2013-12-03 LAB — TSH: TSH: 0.93 u[IU]/mL (ref 0.35–4.50)

## 2013-12-03 NOTE — Assessment & Plan Note (Signed)
Reviewed recent notes and evaluation by [name]. Chronic diarrhea likely related to IBS and h/o bypass surgery with dumping. Will set up GI evaluation with Dr. Allen Norris (her previous GI physician has left town).

## 2013-12-03 NOTE — Telephone Encounter (Signed)
CT abdomen was normal. I would recommend that pt follow up with Dr. Allen Norris as discussed at her visit.

## 2013-12-03 NOTE — Progress Notes (Signed)
Pre visit review using our clinic review tool, if applicable. No additional management support is needed unless otherwise documented below in the visit note. 

## 2013-12-03 NOTE — Telephone Encounter (Signed)
Patient Information:  Caller Name: Denise Sharp  Phone: [phone]  Patient: Denise Sharp  Gender: Female  DOB: March 19, 1946  Age: 68 Years  PCP: Deborra Medina (Adults only)  Office Follow Up:  Does the office need to follow up with this patient?: No  Instructions For The Office: N/A   Symptoms  Reason For Call & Symptoms: Patient calling, she had a soft formed stool at 1030a today and there was a small amount of blood on the tissue. When she stood up there was bright red blood drops on the toilet seat.  She sat back down and there was bright red blood in the diaper (states that she has chronic diarrhea) and also the toilet water was red.  She had her husband check to see if she had external hemorroids and he could not see any external source of bleeding.  She had a hysterectomy at 68yo.  B/P 122/88.  Denies any dizziness.  Reviewed Health History In EMR: Yes  Reviewed Medications In EMR: Yes  Reviewed Allergies In EMR: Yes  Reviewed Surgeries / Procedures: Yes  Date of Onset of Symptoms: 12/03/2013  Guideline(s) Used:  Rectal Bleeding  Disposition Per Guideline:   See Today in Office  Reason For Disposition Reached:   Blood passed alone without any stool  Advice Given:  N/A  Patient Will Follow Care Advice:  YES  Appointment Scheduled:  12/03/2013 13:00:00 Appointment Scheduled Provider:  Ronette Deter (Adults only)

## 2013-12-03 NOTE — Assessment & Plan Note (Signed)
Pt reports intermittent dyspnea both at rest and on exertion. Exam normal today, appears euvolemic. BNP normal. Reviewed ECHO from 2013 and cardiac cath from 2014 both which showed normal EF. Dyspnea most likely from deconditioning. She would benefit from weight loss.

## 2013-12-03 NOTE — Assessment & Plan Note (Signed)
Rectal bleeding this morning after BM most likely from internal hemorrhoid. Exam normal except for small non-bleeding external hemorrhoid. CT abdomen normal. Will set up GI evaluation. CBC normal today. If recurrent profuse bleeding over the weekend, may need admission for GI evaluation.

## 2013-12-03 NOTE — Telephone Encounter (Signed)
FYI-appt scheduled with Dr. Gilford Rile today

## 2013-12-03 NOTE — Progress Notes (Signed)
Subjective:    Patient ID: Denise Sharp, female    DOB: 1946/07/07, 68 y.o.   MRN: [id number]  HPI 68YO female with morbid obesity, chronic diarrhea presents for acute visit.  Rectal bleeding - Had blood in toilet after BM this morning. No clots. Not painful. Blood soaked diaper. Had some abdominal cramping in the right lower abdomen. Now having some diarrhea, which is normal for her. No further bleeding. Stool normal in color for her, brown. No fever, chills. Continues to have some right lower abdominal cramps, more than usual.  Brought diaper from this morning with her today, which has approx 2cm diameter area of bright red blood.  Also concerned about recent weight gain and shortness of breath, which occurs intermittently. Questions whether she may have heart failure. Took one of her husband's lasix and noted some improvement in symptoms of shortness of breath. Occasionally has swelling in her legs. No chest pain. Would like to check labs to evaluate for heart failure.    Review of Systems  Constitutional: Positive for fatigue and unexpected weight change. Negative for fever, chills and appetite change.  HENT: Negative for congestion, ear pain, sinus pressure, sore throat, trouble swallowing and voice change.   Eyes: Negative for visual disturbance.  Respiratory: Positive for shortness of breath. Negative for cough, wheezing and stridor.   Cardiovascular: Positive for leg swelling. Negative for chest pain and palpitations.  Gastrointestinal: Positive for abdominal pain, diarrhea, blood in stool and anal bleeding. Negative for nausea, vomiting, constipation and abdominal distention.  Genitourinary: Negative for dysuria and flank pain.  Musculoskeletal: Negative for arthralgias, gait problem, myalgias and neck pain.  Skin: Negative for color change and rash.  Neurological: Negative for dizziness and headaches.  Hematological: Negative for adenopathy. Does not bruise/bleed easily.    Psychiatric/Behavioral: Negative for suicidal ideas, sleep disturbance and dysphoric mood. The patient is not nervous/anxious.        Objective:    BP 120/70  Pulse 79  Temp(Src) 98.3 F (36.8 C) (Oral)  Resp 16  Ht '5\' 4"'  (1.626 m)  Wt 234 lb 4 oz (106.255 kg)  BMI 40.19 kg/m2  SpO2 97% Physical Exam  Constitutional: She is oriented to person, place, and time. She appears well-developed and well-nourished. No distress.  HENT:  Head: Normocephalic and atraumatic.  Right Ear: External ear normal.  Left Ear: External ear normal.  Nose: Nose normal.  Mouth/Throat: Oropharynx is clear and moist. No oropharyngeal exudate.  Eyes: Conjunctivae are normal. Pupils are equal, round, and reactive to light. Right eye exhibits no discharge. Left eye exhibits no discharge. No scleral icterus.  Neck: Normal range of motion. Neck supple. No tracheal deviation present. No thyromegaly present.  Cardiovascular: Normal rate, regular rhythm, normal heart sounds and intact distal pulses.  Exam reveals no gallop and no friction rub.   No murmur heard. Pulmonary/Chest: Effort normal and breath sounds normal. No accessory muscle usage. Not tachypneic. No respiratory distress. She has no decreased breath sounds. She has no wheezes. She has no rhonchi. She has no rales. She exhibits no tenderness.  Abdominal: Soft. Bowel sounds are normal. There is tenderness (right lower abdomen, however exam limited because of pannus). There is no rebound and no guarding.  Genitourinary: Rectal exam shows external hemorrhoid (small posterior). Rectal exam shows no internal hemorrhoid, no fissure, no mass, no tenderness and anal tone normal.  Musculoskeletal: Normal range of motion. She exhibits no edema and no tenderness.  Lymphadenopathy:    She has  no cervical adenopathy.  Neurological: She is alert and oriented to person, place, and time. No cranial nerve deficit. She exhibits normal muscle tone. Coordination normal.   Skin: Skin is warm and dry. No rash noted. She is not diaphoretic. No erythema. No pallor.  Psychiatric: She has a normal mood and affect. Her behavior is normal. Judgment and thought content normal.          Assessment & Plan:   Problem List Items Addressed This Visit     Unprioritized   Abdominal pain, acute, right lower quadrant - Primary   Relevant Orders      CT Abdomen Pelvis W Contrast      Ambulatory referral to Gastroenterology   Diarrhea     Reviewed recent notes and evaluation by [name]. Chronic diarrhea likely related to IBS and h/o bypass surgery with dumping. Will set up GI evaluation with Dr. Allen Norris (her previous GI physician has left town).    Dyspnea     Pt reports intermittent dyspnea both at rest and on exertion. Exam normal today, appears euvolemic. BNP normal. Reviewed ECHO from 2013 and cardiac cath from 2014 both which showed normal EF. Dyspnea most likely from deconditioning. She would benefit from weight loss.    Relevant Orders      B Nat Peptide (Completed)   Rectal bleeding     Rectal bleeding this morning after BM most likely from internal hemorrhoid. Exam normal except for small non-bleeding external hemorrhoid. CT abdomen normal. Will set up GI evaluation. CBC normal today. If recurrent profuse bleeding over the weekend, may need admission for GI evaluation.    Relevant Orders      Comp Met (CMET) (Completed)      CBC (Completed)      Ambulatory referral to Gastroenterology   Weight gain      Wt Readings from Last 3 Encounters:  12/03/13 234 lb 4 oz (106.255 kg)  11/23/13 233 lb (105.688 kg)  10/12/13 217 lb 6.4 oz (98.612 kg)   Weight is up 17lb from 09/2013. She appears euvolemic on exam today with no signs of fluid overload as cause of weight gain. BNP is normal. TSH normal. Encouraged her to discuss further with [name] in follow up. Weight gain likely secondary to sedentary lifestyle and excess caloric intake.    Relevant Orders       TSH (Completed)     Over 70mn of which >50% spent in face-to-face contact with patient discussing plan of care   Return in about 1 week (around 12/10/2013) for Recheck.

## 2013-12-03 NOTE — Assessment & Plan Note (Signed)
Wt Readings from Last 3 Encounters:  12/03/13 234 lb 4 oz (106.255 kg)  11/23/13 233 lb (105.688 kg)  10/12/13 217 lb 6.4 oz (98.612 kg)   Weight is up 17lb from 09/2013. She appears euvolemic on exam today with no signs of fluid overload as cause of weight gain. BNP is normal. TSH normal. Encouraged her to discuss further with [name] in follow up. Weight gain likely secondary to sedentary lifestyle and excess caloric intake.

## 2013-12-04 NOTE — Telephone Encounter (Signed)
Called and advised patient of results

## 2013-12-08 DIAGNOSIS — E139 Other specified diabetes mellitus without complications: Secondary | ICD-10-CM | POA: Diagnosis not present

## 2013-12-08 DIAGNOSIS — R109 Unspecified abdominal pain: Secondary | ICD-10-CM | POA: Diagnosis not present

## 2013-12-08 DIAGNOSIS — S91309A Unspecified open wound, unspecified foot, initial encounter: Secondary | ICD-10-CM | POA: Diagnosis not present

## 2013-12-09 ENCOUNTER — Encounter: Payer: Self-pay | Admitting: Internal Medicine

## 2013-12-09 ENCOUNTER — Ambulatory Visit (INDEPENDENT_AMBULATORY_CARE_PROVIDER_SITE_OTHER): Payer: Medicare Other | Admitting: Internal Medicine

## 2013-12-09 VITALS — BP 158/80 | HR 82 | Temp 98.0°F | Resp 16 | Ht 64.0 in | Wt 236.5 lb

## 2013-12-09 DIAGNOSIS — K589 Irritable bowel syndrome without diarrhea: Secondary | ICD-10-CM | POA: Diagnosis not present

## 2013-12-09 DIAGNOSIS — R141 Gas pain: Secondary | ICD-10-CM

## 2013-12-09 DIAGNOSIS — R635 Abnormal weight gain: Secondary | ICD-10-CM

## 2013-12-09 DIAGNOSIS — E669 Obesity, unspecified: Secondary | ICD-10-CM | POA: Diagnosis not present

## 2013-12-09 DIAGNOSIS — R143 Flatulence: Secondary | ICD-10-CM

## 2013-12-09 DIAGNOSIS — R140 Abdominal distension (gaseous): Secondary | ICD-10-CM

## 2013-12-09 DIAGNOSIS — R142 Eructation: Secondary | ICD-10-CM | POA: Diagnosis not present

## 2013-12-09 MED ORDER — FUROSEMIDE 20 MG PO TABS
20.0000 mg | ORAL_TABLET | Freq: Every day | ORAL | Status: DC
Start: 2013-12-09 — End: 2015-11-03

## 2013-12-09 NOTE — Patient Instructions (Addendum)
Resume the bentyl 4 times daily to prevent abdominal spasms  Adding daily lasix 20 mg for fluid retention  Abdominal ultrasound  to rule out ascites (fluid in the abdomen)   Referral to Dr Allen Norris is underway for evaluation   You can use Phazymne or gas X for gas   Return in 3 weeks for fasting labs.

## 2013-12-09 NOTE — Progress Notes (Signed)
Pre-visit discussion using our clinic review tool. No additional management support is needed unless otherwise documented below in the visit note.  

## 2013-12-09 NOTE — Progress Notes (Signed)
Patient ID: Denise Sharp, female   DOB: August 25, 1945, 68 y.o.   MRN: [id number]  Patient Active Problem List   Diagnosis Date Noted  . Abdominal pain, acute, right lower quadrant 12/03/2013  . Rectal bleeding 12/03/2013  . Weight gain 12/03/2013  . Dyspnea 12/03/2013  . Headache(784.0) 11/23/2013  . Obesity, unspecified 08/25/2013  . Diarrhea 08/11/2013  . Leukocytosis, unspecified 08/07/2013  . Fatigue 08/04/2013  . Abdominal discomfort in right lower quadrant 08/04/2013  . 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 Abnormality of gait 03/25/2013  . Multiple pulmonary nodules 03/20/2013  . SOB (shortness of breath) 03/20/2013  . Postprandial vomiting 01/20/2013  . Anemia 08/06/2012  . Iron deficiency anemia 08/06/2012  . OSA on CPAP 03/02/2012  . Esophageal stenosis   . Insomnia secondary to anxiety 02/29/2012  . Bundle branch block, left 01/07/2012  . Sciatica 09/05/2011  . Cervical stenosis of spinal canal 06/14/2011  . Adrenal mass 03/13/2011  . Irritable bowel syndrome 03/13/2011  . Restless legs syndrome 03/13/2011  . Degenerative disk disease 03/13/2011  . Hypertension 03/12/2011  . 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 Hearing loss, right 03/12/2011    Subjective:  CC:   Chief Complaint  Patient presents with  . Follow-up    discuss medical necess for abdominal plasty    HPI:   Denise Sharp is a 68 y.o. female who presents for One week follow up on chronic issues, including obesity, chronic diarrhea, exertional dyspnea,  Rectal bleeding/  She was seen by Denise Sharp last week for abdominal pain.  CT of of abd pelvis was,  BNP normal.  GI referral was made to Denise Sharp   Rectal bleeding has stopped.   BP elevated today, but has been labile and drops to 90 systolics.   She has an appt at Denise Sharp  On June 7 with Denise Denise Sharp ([place] [id number]  [id number] [phone])   to discuss a  potential abdominoplasty .  Would like a letter of medical necessity   Weight gain has continued.  19 lbs since narch.  Her abdominal abd swelling is so profound she can't wear a diaper. She is considering major bariatric surgery by Denise Sharp (first was a mini bypass,  Not a Rou en Y due to chronic diarrhea ,  2nd surgery was a replacement of first bypass and was a Rou en Y, since then has not been able to lose weight.)  Denise Sharp is offering something radically differenct.  To avoid carb absorption  Seeing Wound care for lef foot diabetic foot ulcer on plantar surface of foot initially treated by Denise Sharp.  Wound care is recommending foot surgery     Past Medical History  Diagnosis Date  . Diabetes mellitus   . Depression   . Hypertension   . Cervical spinal stenosis 1994    due to trauma to back (Denise Sharp), has intermittent paralysis and parasthesias  . Adrenal mass   . IBS (irritable bowel syndrome)   . Obstructive sleep apnea     using CPAP  . Anemia     iron deficient post  2 unit txfsn 2009, normal endo/colonoscopy by Denise Sharp  . Esophageal stenosis March 2011    with transietn outlet obstruction by food, cleared by EGD   . LBBB (left bundle branch block)   . Obesity   . Apnea   . Headache(784.0)   . Arthritis   . Restless leg syndrome   .  Dizziness     chronic dizziness  . Adrenal mass, left   . 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 Abnormality of gait 03/25/2013    Past Surgical History  Procedure Laterality Date  . Gastric bypass    . Abdominal hysterectomy    . Gastric bypass  2000, 2005    Denise. Debroah Sharp  . Appendectomy    . Gallbladder surgery  1976  . Rotator cuff repair      right  . Joint replacement  2007    bilateral knee. Denise Sharp,  Denise Sharp  . Spine surgery  1995    Denise Sharp  . Interstim implant placement  April 2013    Denise Sharp  . Gallbladder surgery  resection       The following portions of the patient's history were reviewed and updated as  appropriate: Allergies, current medications, and problem list.    Review of Systems:   Patient denies headache, fevers, malaise, unintentional weight loss, skin rash, eye pain, sinus congestion and sinus pain, sore throat, dysphagia,  hemoptysis , cough, dyspnea, wheezing, chest pain, palpitations, orthopnea, edema, abdominal pain, nausea, melena, diarrhea, constipation, flank pain, dysuria, hematuria, urinary  Frequency, nocturia, numbness, tingling, seizures,  Focal weakness, Loss of consciousness,  Tremor, insomnia, depression, anxiety, and suicidal ideation.     History   Social History  . Marital Status: Married    Spouse Name: Denise Sharp     Number of Children: 2  . Years of Education: College    Occupational History  .     Social History Main Topics  . Smoking status: Never Smoker   . Smokeless tobacco: Never Used  . Alcohol Use: No  . Drug Use: No  . Sexual Activity: Not Currently   Other Topics Concern  . Not on file   Social History Narrative   Patient lives at home with husband Denise Sharp.    Patient has 2 children.    Patient has [employer].    Patient is on disability.    Patient is right handed.     Objective:  Filed Vitals:   12/09/13 1004  BP: 158/80  Pulse: 82  Temp: 98 F (36.7 C)  Resp: 16     General appearance: alert, cooperative and appears stated age Ears: normal TM's and external ear canals both ears Throat: lips, mucosa, and tongue normal; teeth and gums normal Neck: no adenopathy, no carotid bruit, supple, symmetrical, trachea midline and thyroid not enlarged, symmetric, no tenderness/mass/nodules Back: symmetric, no curvature. ROM normal. No CVA tenderness. Lungs: clear to auscultation bilaterally Heart: regular rate and rhythm, S1, S2 normal, no murmur, click, rub or gallop Abdomen: soft, non-tender; bowel sounds normal; no masses,  no organomegaly Pulses: 2+ and symmetric Skin: Skin color, texture, turgor normal. No rashes or  lesions Lymph nodes: Cervical, supraclavicular, and axillary nodes normal.  Assessment and Plan:  Weight gain Wt Readings from Last 3 Encounters:  12/09/13 236 lb 8 oz (107.276 kg)  12/03/13 234 lb 4 oz (106.255 kg)  11/23/13 233 lb (105.688 kg)   Weight is up 19 lb from 09/2013. She appears euvolemic on exam today with no signs of fluid overload as cause of weight gain. BNP is normal. TSH normal.  Weight gain likely secondary to sedentary lifestyle and excess caloric intake. will rule out abdominal asciyes with ultrasound.    Irritable bowel syndrome Encouraged to resume bentyl for relief of abdominal pain .  Referral to Denise Sharp under way  Obesity, unspecified She is considering  a third type of surgery due to her regain of weight .    Updated Medication List Outpatient Encounter Prescriptions as of 12/09/2013  Medication Sig  . Cholecalciferol (VITAMIN D3) 10000 UNITS capsule Take 10,000 Units by mouth once a week.  . cyanocobalamin (,VITAMIN B-12,) 1000 MCG/ML injection 1000 ml (1 ml) injection twice monthly  . diazepam (VALIUM) 10 MG tablet Take 1 tablet (10 mg total) by mouth at bedtime as needed for anxiety.  . dicyclomine (BENTYL) 10 MG/5ML syrup Take 5 mLs (10 mg total) by mouth 4 (four) times daily -  before meals and at bedtime.  [name] estradiol (ESTRACE) 0.5 MG tablet Take 1 tablet (0.5 mg total) by mouth daily.  . eszopiclone (LUNESTA) 2 MG TABS tablet Take 1 tablet (2 mg total) by mouth at bedtime. Take immediately before bedtime  . Glucose Blood (BAYER BREEZE 2 TEST) DISK Use one test strip each time glucose needs testing.  . hydrALAZINE (APRESOLINE) 25 MG tablet Take 1 tablet (25 mg total) by mouth 3 (three) times daily. As needed for bp > 160  . hydrocodone-acetaminophen (LORCET-HD) 5-500 MG per capsule Take 1 capsule by mouth every 6 (six) hours as needed.  . Insulin Aspart Prot & Aspart (NOVOLOG MIX 70/30 FLEXPEN [place]) Inject into the skin as needed. Sliding scale  .  Loperamide HCl (IMODIUM PO) As needed  . losartan (COZAAR) 50 MG tablet   . nebivolol (BYSTOLIC) 5 MG tablet Take 1 tablet (5 mg total) by mouth daily.  [name] oxyCODONE-acetaminophen (PERCOCET/ROXICET) 5-325 MG per tablet Take 1 tablet by mouth every 6 (six) hours as needed.  . pregabalin (LYRICA) 75 MG capsule Take 75 mg by mouth 2 (two) times daily.  [name] rOPINIRole (REQUIP) 1 MG tablet Take 1 tablet (1 mg total) by mouth at bedtime.  . sitaGLIPtin (JANUVIA) 100 MG tablet Take 1 tablet (100 mg total) by mouth daily.  . tizanidine (ZANAFLEX) 2 MG capsule Take 1 capsule (2 mg total) by mouth 3 (three) times daily as needed for muscle spasms.  . traMADol (ULTRAM) 50 MG tablet Take 50 mg by mouth 4 (four) times daily as needed.  . furosemide (LASIX) 20 MG tablet Take 1 tablet (20 mg total) by mouth daily.     Orders Placed This Encounter  Procedures  . US Abdomen Complete  . HM DIABETES EYE EXAM  . HM DIABETES FOOT EXAM    No Follow-up on file.

## 2013-12-11 ENCOUNTER — Encounter: Payer: Self-pay | Admitting: Podiatry

## 2013-12-11 ENCOUNTER — Telehealth: Payer: Self-pay

## 2013-12-11 ENCOUNTER — Telehealth: Payer: Self-pay | Admitting: Internal Medicine

## 2013-12-11 DIAGNOSIS — E1142 Type 2 diabetes mellitus with diabetic polyneuropathy: Secondary | ICD-10-CM

## 2013-12-11 NOTE — Telephone Encounter (Signed)
Please advise 

## 2013-12-11 NOTE — Telephone Encounter (Signed)
Denise Sharp wants to get a prescription for custom diabetic shoes. She stated that because of the problems with her feet they have to be "custom". She would like to see if Dr. Paulla Dolly will write the RX since Dr. Milinda Pointer is out of the office.

## 2013-12-11 NOTE — Telephone Encounter (Signed)
States she needs new diabetic shoes and needs script faxed to [phone] (pt's home).  States she has called the [place] and her podiatrist.  [facility], podiatrist unable to provide script for three weeks.  States this is not for inserts, her feet are two different sizes.  States script must say "custom made diabetic shoes due to deformities".  States she will have to go for foot molding and needs this done asap so that she can get her shoes.  Asking for a call to keep her informed, cell.  States if this is unable to be faxed to her, her husband can come and pick it up.

## 2013-12-12 LAB — HM DIABETES FOOT EXAM: HM Diabetic Foot Exam: ABNORMAL

## 2013-12-12 NOTE — Assessment & Plan Note (Addendum)
Wt Readings from Last 3 Encounters:  12/09/13 236 lb 8 oz (107.276 kg)  12/03/13 234 lb 4 oz (106.255 kg)  11/23/13 233 lb (105.688 kg)   Weight is up 19 lb from 09/2013. She appears euvolemic on exam today with no signs of fluid overload as cause of weight gain. BNP is normal. TSH normal.  Weight gain likely secondary to sedentary lifestyle and excess caloric intake. will rule out abdominal asciyes with ultrasound.

## 2013-12-12 NOTE — Assessment & Plan Note (Signed)
Encouraged to resume bentyl for relief of abdominal pain .  Referral to Dr Allen Norris under way

## 2013-12-12 NOTE — Assessment & Plan Note (Signed)
She is considering a third type of surgery due to her regain of weight .

## 2013-12-13 NOTE — Telephone Encounter (Signed)
DME order for daibetic shoes done. If not printed,  Print ,.

## 2013-12-14 ENCOUNTER — Encounter: Payer: Self-pay | Admitting: *Deleted

## 2013-12-14 ENCOUNTER — Ambulatory Visit: Payer: Self-pay | Admitting: Internal Medicine

## 2013-12-14 ENCOUNTER — Encounter: Payer: Self-pay | Admitting: Internal Medicine

## 2013-12-14 ENCOUNTER — Telehealth: Payer: Self-pay | Admitting: Internal Medicine

## 2013-12-14 DIAGNOSIS — R141 Gas pain: Secondary | ICD-10-CM | POA: Diagnosis not present

## 2013-12-14 DIAGNOSIS — Z79899 Other long term (current) drug therapy: Secondary | ICD-10-CM | POA: Diagnosis not present

## 2013-12-14 DIAGNOSIS — R935 Abnormal findings on diagnostic imaging of other abdominal regions, including retroperitoneum: Secondary | ICD-10-CM | POA: Diagnosis not present

## 2013-12-14 DIAGNOSIS — R142 Eructation: Secondary | ICD-10-CM | POA: Diagnosis not present

## 2013-12-14 DIAGNOSIS — R143 Flatulence: Secondary | ICD-10-CM | POA: Diagnosis not present

## 2013-12-14 NOTE — Telephone Encounter (Signed)
Patient needs letter of necessity, have order.

## 2013-12-14 NOTE — Telephone Encounter (Signed)
error 

## 2013-12-14 NOTE — Telephone Encounter (Signed)
Pt states she needs letter of medical necessity, states she has spoken with [name] about this.  Pt would like to pick up the letter and her order for diabetic shoes today if possible.  Call on cell.

## 2013-12-14 NOTE — Telephone Encounter (Signed)
Letter printed.

## 2013-12-14 NOTE — Telephone Encounter (Signed)
Patient notified ready for pick up

## 2013-12-14 NOTE — Progress Notes (Signed)
Spoke to patient regarding the custom diabetic shoes, explained to her that dr Milinda Pointer was out of office and would speak to him regarding this. She may also call her primary doctor regarding necessity letter

## 2013-12-14 NOTE — Telephone Encounter (Signed)
Notified patient ready for pick up.

## 2013-12-15 ENCOUNTER — Encounter: Payer: Self-pay | Admitting: Surgery

## 2013-12-15 DIAGNOSIS — E139 Other specified diabetes mellitus without complications: Secondary | ICD-10-CM | POA: Diagnosis not present

## 2013-12-15 DIAGNOSIS — S91309A Unspecified open wound, unspecified foot, initial encounter: Secondary | ICD-10-CM | POA: Diagnosis not present

## 2013-12-16 ENCOUNTER — Telehealth: Payer: Self-pay | Admitting: Internal Medicine

## 2013-12-16 NOTE — Telephone Encounter (Signed)
Sent my chart with results. 

## 2013-12-16 NOTE — Telephone Encounter (Signed)
Abdominal ultrasound showed fatty liver,  No ascites (fluid)

## 2013-12-17 ENCOUNTER — Telehealth: Payer: Self-pay | Admitting: Internal Medicine

## 2013-12-17 NOTE — Telephone Encounter (Signed)
I told her that this was not something that need to be done until  she saw the surgeon, and that he should edo it ,  Not me

## 2013-12-17 NOTE — Telephone Encounter (Signed)
Patient notified and stated she disagrees but will don as stated.

## 2013-12-17 NOTE — Telephone Encounter (Signed)
Needs letter of necessity for abdominal plasty. Please advise would like to pick up tomorrow.

## 2013-12-18 DIAGNOSIS — R197 Diarrhea, unspecified: Secondary | ICD-10-CM | POA: Diagnosis not present

## 2013-12-18 DIAGNOSIS — K921 Melena: Secondary | ICD-10-CM | POA: Diagnosis not present

## 2013-12-22 ENCOUNTER — Encounter: Payer: Self-pay | Admitting: Surgery

## 2013-12-22 DIAGNOSIS — S91309A Unspecified open wound, unspecified foot, initial encounter: Secondary | ICD-10-CM | POA: Diagnosis not present

## 2013-12-22 DIAGNOSIS — E139 Other specified diabetes mellitus without complications: Secondary | ICD-10-CM | POA: Diagnosis not present

## 2013-12-23 DIAGNOSIS — E65 Localized adiposity: Secondary | ICD-10-CM | POA: Diagnosis not present

## 2013-12-26 ENCOUNTER — Other Ambulatory Visit: Payer: Self-pay | Admitting: Internal Medicine

## 2013-12-26 DIAGNOSIS — E119 Type 2 diabetes mellitus without complications: Secondary | ICD-10-CM

## 2013-12-26 DIAGNOSIS — Z9884 Bariatric surgery status: Secondary | ICD-10-CM

## 2013-12-26 DIAGNOSIS — E559 Vitamin D deficiency, unspecified: Secondary | ICD-10-CM

## 2013-12-28 ENCOUNTER — Other Ambulatory Visit (INDEPENDENT_AMBULATORY_CARE_PROVIDER_SITE_OTHER): Payer: Medicare Other

## 2013-12-28 DIAGNOSIS — Z9884 Bariatric surgery status: Secondary | ICD-10-CM

## 2013-12-28 DIAGNOSIS — D649 Anemia, unspecified: Secondary | ICD-10-CM

## 2013-12-28 DIAGNOSIS — S91309A Unspecified open wound, unspecified foot, initial encounter: Secondary | ICD-10-CM | POA: Diagnosis not present

## 2013-12-28 DIAGNOSIS — D72829 Elevated white blood cell count, unspecified: Secondary | ICD-10-CM

## 2013-12-28 DIAGNOSIS — E1349 Other specified diabetes mellitus with other diabetic neurological complication: Secondary | ICD-10-CM

## 2013-12-28 DIAGNOSIS — E119 Type 2 diabetes mellitus without complications: Secondary | ICD-10-CM | POA: Diagnosis not present

## 2013-12-28 DIAGNOSIS — E139 Other specified diabetes mellitus without complications: Secondary | ICD-10-CM | POA: Diagnosis not present

## 2013-12-28 DIAGNOSIS — R5383 Other fatigue: Secondary | ICD-10-CM

## 2013-12-28 DIAGNOSIS — E559 Vitamin D deficiency, unspecified: Secondary | ICD-10-CM | POA: Diagnosis not present

## 2013-12-28 DIAGNOSIS — D509 Iron deficiency anemia, unspecified: Secondary | ICD-10-CM

## 2013-12-28 DIAGNOSIS — E08610 Diabetes mellitus due to underlying condition with diabetic neuropathic arthropathy: Secondary | ICD-10-CM

## 2013-12-28 DIAGNOSIS — R5381 Other malaise: Secondary | ICD-10-CM

## 2013-12-28 DIAGNOSIS — I10 Essential (primary) hypertension: Secondary | ICD-10-CM

## 2013-12-28 LAB — HEMOGLOBIN A1C: Hgb A1c MFr Bld: 7.2 % — ABNORMAL HIGH (ref 4.6–6.5)

## 2013-12-28 LAB — LIPID PANEL
Cholesterol: 178 mg/dL (ref 0–200)
HDL: 45 mg/dL (ref >39.00)
LDL Cholesterol: 104 mg/dL — ABNORMAL HIGH (ref 0–99)
NonHDL: 133
Total CHOL/HDL Ratio: 4
Triglycerides: 144 mg/dL (ref 0.0–149.0)
VLDL: 28.8 mg/dL (ref 0.0–40.0)

## 2013-12-28 LAB — VITAMIN D 25 HYDROXY (VIT D DEFICIENCY, FRACTURES): VITD: 19.76 ng/mL

## 2013-12-28 LAB — VITAMIN B12: Vitamin B-12: 323 pg/mL (ref 211–911)

## 2013-12-29 ENCOUNTER — Telehealth: Payer: Self-pay | Admitting: Internal Medicine

## 2013-12-29 LAB — FOLATE RBC: RBC Folate: 364 ng/mL (ref >280)

## 2013-12-29 MED ORDER — ERGOCALCIFEROL 1.25 MG (50000 UT) PO CAPS: 50000.0000 [IU] | ORAL_CAPSULE | ORAL | Status: DC

## 2013-12-29 NOTE — Telephone Encounter (Signed)
Your cholesterol, liver and kidney function are normal.  Your A1c is slightly higher at 7.2 .  Your vitamin D is low, which can increase your risk of weak bones and fractures and interfere with your body's ability to absorb the calcium in your diet.   I am calling in a megadose of Vit D to take once weekly for a total of 3 months,  Then after you finish the weekly supplement, you should start taking an OTC  Vit D3 supplement 1000 units daily.   Regards,  [name]

## 2013-12-31 ENCOUNTER — Encounter: Payer: Self-pay | Admitting: *Deleted

## 2013-12-31 NOTE — Telephone Encounter (Signed)
Letter mailed

## 2014-01-01 ENCOUNTER — Encounter: Payer: Self-pay | Admitting: Internal Medicine

## 2014-01-05 DIAGNOSIS — S91309A Unspecified open wound, unspecified foot, initial encounter: Secondary | ICD-10-CM | POA: Diagnosis not present

## 2014-01-05 DIAGNOSIS — E139 Other specified diabetes mellitus without complications: Secondary | ICD-10-CM | POA: Diagnosis not present

## 2014-01-06 ENCOUNTER — Ambulatory Visit: Payer: Self-pay | Admitting: Gastroenterology

## 2014-01-06 ENCOUNTER — Ambulatory Visit (INDEPENDENT_AMBULATORY_CARE_PROVIDER_SITE_OTHER): Payer: Medicare Other | Admitting: Internal Medicine

## 2014-01-06 ENCOUNTER — Encounter: Payer: Self-pay | Admitting: Internal Medicine

## 2014-01-06 VITALS — BP 120/78 | HR 58 | Temp 98.7°F | Resp 16 | Ht 64.0 in | Wt 230.5 lb

## 2014-01-06 DIAGNOSIS — Z9884 Bariatric surgery status: Secondary | ICD-10-CM | POA: Diagnosis not present

## 2014-01-06 DIAGNOSIS — E08610 Diabetes mellitus due to underlying condition with diabetic neuropathic arthropathy: Secondary | ICD-10-CM

## 2014-01-06 DIAGNOSIS — K921 Melena: Secondary | ICD-10-CM | POA: Diagnosis not present

## 2014-01-06 DIAGNOSIS — R0602 Shortness of breath: Secondary | ICD-10-CM | POA: Diagnosis not present

## 2014-01-06 DIAGNOSIS — K625 Hemorrhage of anus and rectum: Secondary | ICD-10-CM | POA: Diagnosis not present

## 2014-01-06 DIAGNOSIS — E559 Vitamin D deficiency, unspecified: Secondary | ICD-10-CM | POA: Diagnosis not present

## 2014-01-06 DIAGNOSIS — Z79899 Other long term (current) drug therapy: Secondary | ICD-10-CM | POA: Diagnosis not present

## 2014-01-06 DIAGNOSIS — Z23 Encounter for immunization: Secondary | ICD-10-CM | POA: Diagnosis not present

## 2014-01-06 DIAGNOSIS — R197 Diarrhea, unspecified: Secondary | ICD-10-CM | POA: Diagnosis not present

## 2014-01-06 DIAGNOSIS — Z96659 Presence of unspecified artificial knee joint: Secondary | ICD-10-CM | POA: Diagnosis not present

## 2014-01-06 DIAGNOSIS — E119 Type 2 diabetes mellitus without complications: Secondary | ICD-10-CM | POA: Diagnosis not present

## 2014-01-06 DIAGNOSIS — E669 Obesity, unspecified: Secondary | ICD-10-CM

## 2014-01-06 DIAGNOSIS — Z6839 Body mass index (BMI) 39.0-39.9, adult: Secondary | ICD-10-CM | POA: Diagnosis not present

## 2014-01-06 DIAGNOSIS — E1349 Other specified diabetes mellitus with other diabetic neurological complication: Secondary | ICD-10-CM

## 2014-01-06 DIAGNOSIS — K648 Other hemorrhoids: Secondary | ICD-10-CM | POA: Diagnosis not present

## 2014-01-06 DIAGNOSIS — I10 Essential (primary) hypertension: Secondary | ICD-10-CM | POA: Diagnosis not present

## 2014-01-06 DIAGNOSIS — Z888 Allergy status to other drugs, medicaments and biological substances status: Secondary | ICD-10-CM | POA: Diagnosis not present

## 2014-01-06 DIAGNOSIS — R635 Abnormal weight gain: Secondary | ICD-10-CM

## 2014-01-06 DIAGNOSIS — Z881 Allergy status to other antibiotic agents status: Secondary | ICD-10-CM | POA: Diagnosis not present

## 2014-01-06 LAB — HM COLONOSCOPY

## 2014-01-06 MED ORDER — ZOSTER VACCINE LIVE 19400 UNT/0.65ML ~~LOC~~ SOLR
0.6500 mL | Freq: Once | SUBCUTANEOUS | Status: DC
Start: 2014-01-06 — End: 2014-04-29

## 2014-01-06 MED ORDER — TETANUS-DIPHTH-ACELL PERTUSSIS 5-2.5-18.5 LF-MCG/0.5 IM SUSP
0.5000 mL | Freq: Once | INTRAMUSCULAR | Status: DC
Start: 2014-01-06 — End: 2014-04-29

## 2014-01-06 MED ORDER — GLIPIZIDE 5 MG PO TABS: 5.0000 mg | ORAL_TABLET | Freq: Two times a day (BID) | ORAL | Status: DC

## 2014-01-06 MED ORDER — NYSTATIN 100000 UNIT/GM EX POWD: Freq: Two times a day (BID) | CUTANEOUS | Status: DC

## 2014-01-06 MED ORDER — ALOSETRON HCL 0.5 MG PO TABS: 0.5000 mg | ORAL_TABLET | Freq: Two times a day (BID) | ORAL | Status: DC

## 2014-01-06 NOTE — Patient Instructions (Addendum)
I recommend getting the majority of your calcium and Vitamin D  through diet rather than supplements given the recent association of calcium supplements with increased coronary artery calcium scores  Unsweetened almond/coconut milk is a great low calorie low carb way to increase your dietary calcium and vitamin D.  Try the blue Jackquline Bosch  I will write a letter in support of the panniculectomy due to recurrent skin rashes to Dr Chrisandra Carota  I am prescribing nystatin powder to use on your rash twice daily unti the redness resolves. Then switch to Gold [employer] Medicated powder    We will schedule your mammogram soon .   You received the  Prevnar (pneumonia) vaccine today.  If you develop a sore arm , use tylenol and ibuprofen for a few days   Your still need your tetanus-diptheria-pertussis vaccine (TDaP)and Shingles vaccines  but you can get it for less $$$ at a local pharmacy with the scripts I have provided you.

## 2014-01-06 NOTE — Assessment & Plan Note (Signed)
Secondary to gastric bypass. 

## 2014-01-06 NOTE — Progress Notes (Signed)
Patient ID: Denise Sharp, female   DO: 11-02-1945, 68 y.o.   MRN: [id number]   Patient Active Problem List   Diagnosis Date Noted  . Unspecified vitamin D deficiency 01/06/2014  . Abdominal pain, acute, right lower quadrant 12/03/2013  . Rectal bleeding 12/03/2013  . Weight gain 12/03/2013  . Dyspnea 12/03/2013  . Headache(784.0) 11/23/2013  . Obesity, unspecified 08/25/2013  . Diarrhea 08/11/2013  . Leukocytosis, unspecified 08/07/2013  . Fatigue 08/04/2013  . Abdominal discomfort in right lower quadrant 08/04/2013  . 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 Abnormality of gait 03/25/2013  . Multiple pulmonary nodules 03/20/2013  . SOB (shortness of breath) 03/20/2013  . Postprandial vomiting 01/20/2013  . Anemia 08/06/2012  . Iron deficiency anemia 08/06/2012  . OSA on CPAP 03/02/2012  . Esophageal stenosis   . Insomnia secondary to anxiety 02/29/2012  . Bundle branch block, left 01/07/2012  . Sciatica 09/05/2011  . Cervical stenosis of spinal canal 06/14/2011  . Adrenal mass 03/13/2011  . Irritable bowel syndrome 03/13/2011  . Restless legs syndrome 03/13/2011  . Degenerative disk disease 03/13/2011  . Hypertension 03/12/2011  . 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 Hearing loss, right 03/12/2011    Subjective:  CC:   Chief Complaint  Patient presents with  . Follow-up    4 week   . Diabetes    HPI:   Denise Sharp is a 68 y.o. female who presents for Follow up on multiple medical issues including DM Type 2 with peripheral neuropathy, obesity s/p gastric bypass complicated by chronic diarrhea and increasing abdominal girth secondary to pannus formation with frequent intertriginous candidiasis.   She has been evaluated by [place] wound care surgeon for consideration of panniculectomy which has been tentatively scheduled pending approval by insurance.   DM with nueopathy:  She has a foot ulcer  on the head of the left 5th metatarsal  That Dr Milinda Pointer is treating with debridement.  She has had a prior partial amputation and has a foot deformity and is requesting diabetic support shoes.   Diabetic foot exam documented .     Candidiasis in pannus: recurrent.,  Has not brought it to my attention in the past but was advised to do so by surgeon.     Diarrhea:  She underwent a colonoscopy today by Allen Norris.  Normal.  Started her on alosetron  0.5 mg bid for diarrhea predominant IBS.  Has not started the medication yet.     Past Medical History  Diagnosis Date  . Diabetes mellitus   . Depression   . Hypertension   . Cervical spinal stenosis 1994    due to trauma to back ([employer] accident), has intermittent paralysis and parasthesias  . Adrenal mass   . IBS (irritable bowel syndrome)   . Obstructive sleep apnea     using CPAP  . Anemia     iron deficient post  2 unit txfsn 2009, normal endo/colonoscopy by [facility]  . Esophageal stenosis March 2011    with transietn outlet obstruction by food, cleared by EGD   . LBBB (left bundle branch block)   . Obesity   . Apnea   . Headache(784.0)   . Arthritis   . Restless leg syndrome   . Dizziness     chronic dizziness  . Adrenal mass, left   . 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 Abnormality of gait 03/25/2013    Past Surgical History  Procedure Laterality Date  .  Gastric bypass    . Abdominal hysterectomy    . Gastric bypass  2000, 2005    Dr. Debroah Loop  . Appendectomy    . Gallbladder surgery  1976  . Rotator cuff repair      right  . Joint replacement  2007    bilateral knee. [name],  [name]  . Spine surgery  1995    [place]  . Interstim implant placement  April 2013    [place]  . Gallbladder surgery  resection       The following portions of the patient's history were reviewed and updated as appropriate: Allergies, current medications, and problem list.    Review of Systems:   Patient denies headache, fevers,  malaise, unintentional weight loss, skin rash, eye pain, sinus congestion and sinus pain, sore throat, dysphagia,  hemoptysis , cough, dyspnea, wheezing, chest pain, palpitations, orthopnea, edema, abdominal pain, nausea, melena, diarrhea, constipation, flank pain, dysuria, hematuria, urinary  Frequency, nocturia, numbness, tingling, seizures,  Focal weakness, Loss of consciousness,  Tremor, insomnia, depression, anxiety, and suicidal ideation.     History   Social History  . Marital Status: Married    Spouse Name: [name]     Number of Children: 2  . Years of Education: College    Occupational History  .     Social History Main Topics  . Smoking status: Never Smoker   . Smokeless tobacco: Never Used  . Alcohol Use: No  . Drug Use: No  . Sexual Activity: Not Currently   Other Topics Concern  . Not on file   Social History Narrative   Patient lives at home with husband [name].    Patient has 2 children.    Patient has [employer].    Patient is on disability.    Patient is right handed.     Objective:  Filed Vitals:   01/06/14 1538  BP: 120/78  Pulse: 58  Temp: 98.7 F (37.1 C)  Resp: 16     General appearance: alert, cooperative and appears stated age Ears: normal TM's and external ear canals both ears Throat: lips, mucosa, and tongue normal; teeth and gums normal Neck: no adenopathy, no carotid bruit, supple, symmetrical, trachea midline and thyroid not enlarged, symmetric, no tenderness/mass/nodules Back: symmetric, no curvature. ROM normal. No CVA tenderness. Lungs: clear to auscultation bilaterally Heart: regular rate and rhythm, S1, S2 normal, no murmur, click, rub or gallop Abdomen: soft, non-tender; bowel sounds normal; no masses,  no organomegaly Pulses: 2+ and symmetric Skin: Skin color, texture, turgor normal. No rashes or lesions Lymph nodes: Cervical, supraclavicular, and axillary nodes normal.  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 Patient is up-to-date on eye exams and foot exam is normal today. Patient is due for urine microalbumin to creatinine ratio at next visit. Patient is tolerating statin therapy for CAD risk reduction and on ACE/ARB for reduction in proteinuria.  Lab Results  Component Value Date   HGBA1C 7.2* 12/28/2013   Lab Results  Component Value Date   MICROALBUR 6.7* 12/13/2011     Rectal bleeding colonoscopy was done today (June 24) by Dr. Allen Norris   Weight gain She is concerned about her continued weight gain and abdominal girth despite eating very little and having chronic diarrhea.  Abdominal ultrasound showed no evidence of ascites.  I have  recommended a low glycemic index diet utilizing  smaller more frequent meals to increase metabolism.  I have also recommended that patient start exercising with a goal of 30 minutes of aerobic exercise a minimum of 5 days per week.   Obesity, unspecified She is concerned about her continued weight gain and abdominal girth despite eating very little and having chronic diarrhea.  Abdominal ultrasound done last month ruled out ascites.  I have  recommended a low glycemic index diet utilizing smaller more frequent meals to increase metabolism.  I have also recommended that patient start exercising with a goal of 30 minutes of aerobic exercise a minimum of 5 days per week.   A total of 40 minutes was spent with patient more than half of which was spent in counseling on the above mentioned problems, reviewing records from other providers and coordination of care.   Updated Medication List Outpatient Encounter Prescriptions as of 01/06/2014  Medication Sig  . alosetron (LOTRONEX) 0.5 MG tablet Take 1 tablet (0.5 mg total) by mouth 2 (two) times daily.  . Cholecalciferol (VITAMIN D3) 10000 UNITS  capsule Take 10,000 Units by mouth once a week.  . cyanocobalamin (,VITAMIN B-12,) 1000 MCG/ML injection 1000 ml (1 ml) injection twice monthly  . diazepam (VALIUM) 10 MG tablet Take 1 tablet (10 mg total) by mouth at bedtime as needed for anxiety.  . ergocalciferol (DRISDOL) 50000 UNITS capsule Take 1 capsule (50,000 Units total) by mouth once a week.  . estradiol (ESTRACE) 0.5 MG tablet Take 1 tablet (0.5 mg total) by mouth daily.  . eszopiclone (LUNESTA) 2 MG TABS tablet Take 1 tablet (2 mg total) by mouth at bedtime. Take immediately before bedtime  . furosemide (LASIX) 20 MG tablet Take 1 tablet (20 mg total) by mouth daily.  . Glucose Blood (BAYER BREEZE 2 TEST) DISK Use one test strip each time glucose needs testing.  . hydrALAZINE (APRESOLINE) 25 MG tablet Take 1 tablet (25 mg total) by mouth 3 (three) times daily. As needed for bp > 160  . hydrocodone-acetaminophen (LORCET-HD) 5-500 MG per capsule Take 1 capsule by mouth every 6 (six) hours as needed.  . Insulin Aspart Prot & Aspart (NOVOLOG MIX 70/30 FLEXPEN [place]) Inject into the skin as needed. Sliding scale  . Loperamide HCl (IMODIUM PO) As needed  . losartan (COZAAR) 50 MG tablet   . nebivolol (BYSTOLIC) 5 MG tablet Take 1 tablet (5 mg total) by mouth daily.  [name] oxyCODONE-acetaminophen (PERCOCET/ROXICET) 5-325 MG per tablet Take 1 tablet by mouth every 6 (six) hours as needed.  . pregabalin (LYRICA) 75 MG capsule Take 75 mg by mouth 2 (two) times daily.  [name] rOPINIRole (REQUIP) 1 MG tablet Take 1 tablet (1 mg total) by mouth at bedtime.  . sitaGLIPtin (JANUVIA) 100 MG tablet Take 1 tablet (100 mg total) by mouth daily.  . tizanidine (ZANAFLEX) 2 MG capsule Take 1 capsule (2 mg total) by mouth 3 (three) times daily as needed for muscle spasms.  . traMADol (ULTRAM) 50 MG tablet Take 50 mg by mouth 4 (four) times daily as needed.  . [DISCONTINUED] alosetron (LOTRONEX) 0.5 MG tablet Take 0.5 mg by mouth 2 (two) times daily.  [name] dicyclomine  (BENTYL) 10 MG/5ML syrup Take 5 mLs (10 mg total) by mouth 4 (four) times daily -  before meals and at bedtime.  [name] glipiZIDE (GLUCOTROL) 5 MG tablet Take 1 tablet (5 mg total) by mouth 2 (two) times daily before a meal.  . nystatin (MYCOSTATIN) powder Apply topically 2 (two) times daily.  [name]  Tdap (BOOSTRIX) 5-2.5-18.5 LF-MCG/0.5 injection Inject 0.5 mLs into the muscle once.  . zoster vaccine live, PF, (ZOSTAVAX) 16109 UNT/0.65ML injection Inject 19,400 Units into the skin once.     Orders Placed This Encounter  Procedures  . Pneumococcal conjugate vaccine 13-valent    No Follow-up on file.

## 2014-01-06 NOTE — Progress Notes (Signed)
Pre-visit discussion using our clinic review tool. No additional management support is needed unless otherwise documented below in the visit note.  

## 2014-01-07 DIAGNOSIS — IMO0001 Reserved for inherently not codable concepts without codable children: Secondary | ICD-10-CM | POA: Diagnosis not present

## 2014-01-07 DIAGNOSIS — E11329 Type 2 diabetes mellitus with mild nonproliferative diabetic retinopathy without macular edema: Secondary | ICD-10-CM | POA: Diagnosis not present

## 2014-01-07 LAB — PATHOLOGY REPORT

## 2014-01-08 NOTE — Assessment & Plan Note (Signed)
Historically well-controlled on current medications. Patient is up-to-date on eye exams and foot exam is normal today. Patient is due for urine microalbumin to creatinine ratio at next visit. Patient is tolerating statin therapy for CAD risk reduction and on ACE/ARB for reduction in proteinuria.  Lab Results  Component Value Date   HGBA1C 7.2* 12/28/2013   Lab Results  Component Value Date   MICROALBUR 6.7* 12/13/2011

## 2014-01-08 NOTE — Assessment & Plan Note (Signed)
colonoscopy was done today (June 24) by Dr. Allen Norris

## 2014-01-08 NOTE — Assessment & Plan Note (Signed)
She is concerned about her continued weight gain and abdominal girth despite eating very little and having chronic diarrhea.  Abdominal ultrasound done last month ruled out ascites.  I have  recommended a low glycemic index diet utilizing smaller more frequent meals to increase metabolism.  I have also recommended that patient start exercising with a goal of 30 minutes of aerobic exercise a minimum of 5 days per week.

## 2014-01-08 NOTE — Assessment & Plan Note (Addendum)
She is concerned about her continued weight gain and abdominal girth despite eating very little and having chronic diarrhea.  Abdominal ultrasound showed no evidence of ascites.  I have  recommended a low glycemic index diet utilizing smaller more frequent meals to increase metabolism.  I have also recommended that patient start exercising with a goal of 30 minutes of aerobic exercise a minimum of 5 days per week.

## 2014-01-13 ENCOUNTER — Ambulatory Visit (INDEPENDENT_AMBULATORY_CARE_PROVIDER_SITE_OTHER): Payer: Medicare Other | Admitting: Podiatry

## 2014-01-13 ENCOUNTER — Encounter: Payer: Self-pay | Admitting: Podiatry

## 2014-01-13 DIAGNOSIS — L97509 Non-pressure chronic ulcer of other part of unspecified foot with unspecified severity: Secondary | ICD-10-CM | POA: Diagnosis not present

## 2014-01-13 DIAGNOSIS — L02619 Cutaneous abscess of unspecified foot: Secondary | ICD-10-CM

## 2014-01-13 DIAGNOSIS — E1169 Type 2 diabetes mellitus with other specified complication: Secondary | ICD-10-CM | POA: Diagnosis not present

## 2014-01-13 DIAGNOSIS — E11621 Type 2 diabetes mellitus with foot ulcer: Secondary | ICD-10-CM

## 2014-01-13 DIAGNOSIS — L03119 Cellulitis of unspecified part of limb: Secondary | ICD-10-CM | POA: Diagnosis not present

## 2014-01-13 DIAGNOSIS — L97529 Non-pressure chronic ulcer of other part of left foot with unspecified severity: Secondary | ICD-10-CM

## 2014-01-13 MED ORDER — MUPIROCIN 2 % EX OINT: TOPICAL_OINTMENT | CUTANEOUS | Status: DC

## 2014-01-13 MED ORDER — AMOXICILLIN-POT CLAVULANATE 875-125 MG PO TABS: 1.0000 | ORAL_TABLET | Freq: Two times a day (BID) | ORAL | Status: DC

## 2014-01-13 NOTE — Progress Notes (Signed)
She presents today stating that she is done with the wound care clinic. She states that her left foot with the ulceration continues to be painful. She denies fever chills nausea vomiting muscle aches and pains. She states that she will have her custom molded shoes in the middle to the end of August.  Objective: Vital signs are stable she is alert and oriented x3 pulses are palpable left foot. Deformity of the left foot resulting in ulceration fifth metatarsal base left. Measures approximately one millimeter in diameter and does not probe to bone. There is a surrounding area of erythema or cellulitis extending approximately a centimeter past the wound circumferentially. I debrided the wound today it did not probe to bone. I see no signs of osteomyelitis.  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  Plan: Sharp debridement of the wound today with dressing. Started her on Augmentin 875 mg 1 by mouth twice a day also read wrote her prescription for Bactroban ointment to be applied after soaking. I will followup with her in 4 weeks

## 2014-01-14 ENCOUNTER — Encounter: Payer: Self-pay | Admitting: *Deleted

## 2014-01-14 ENCOUNTER — Other Ambulatory Visit: Payer: Self-pay | Admitting: *Deleted

## 2014-01-14 MED ORDER — MUPIROCIN 2 % EX OINT: TOPICAL_OINTMENT | CUTANEOUS | Status: DC

## 2014-01-14 MED ORDER — AMOXICILLIN-POT CLAVULANATE 875-125 MG PO TABS: 1.0000 | ORAL_TABLET | Freq: Two times a day (BID) | ORAL | Status: DC

## 2014-01-14 NOTE — Telephone Encounter (Signed)
Pt called said rx went to wrong pharmacy pls [facility]. i cant wait 3 weeks for my antibiotis. [facility] to [facility] [address]

## 2014-01-19 ENCOUNTER — Telehealth: Payer: Self-pay | Admitting: *Deleted

## 2014-01-19 MED ORDER — CLARITHROMYCIN 500 MG PO TABS
500.0000 mg | ORAL_TABLET | Freq: Two times a day (BID) | ORAL | Status: DC
Start: 2014-01-19 — End: 2014-03-12

## 2014-01-19 MED ORDER — FLUCONAZOLE 150 MG PO TABS: 150.0000 mg | ORAL_TABLET | Freq: Once | ORAL | Status: DC

## 2014-01-19 NOTE — Telephone Encounter (Signed)
I called and informed the patient to stop the Augmentin per Dr. Milinda Pointer.  I told her he e-scribed her a prescription for Diflucan and Biaxin to her pharmacy.  She said so he doesn't want to see my foot until the end of the month.  I told her I assume so.

## 2014-01-27 ENCOUNTER — Encounter: Payer: Self-pay | Admitting: Internal Medicine

## 2014-02-10 ENCOUNTER — Ambulatory Visit (INDEPENDENT_AMBULATORY_CARE_PROVIDER_SITE_OTHER): Payer: Medicare Other | Admitting: Podiatry

## 2014-02-10 DIAGNOSIS — E11621 Type 2 diabetes mellitus with foot ulcer: Secondary | ICD-10-CM

## 2014-02-10 DIAGNOSIS — L97529 Non-pressure chronic ulcer of other part of left foot with unspecified severity: Secondary | ICD-10-CM

## 2014-02-10 DIAGNOSIS — M79676 Pain in unspecified toe(s): Secondary | ICD-10-CM

## 2014-02-10 DIAGNOSIS — M79609 Pain in unspecified limb: Secondary | ICD-10-CM

## 2014-02-10 DIAGNOSIS — B351 Tinea unguium: Secondary | ICD-10-CM | POA: Diagnosis not present

## 2014-02-10 DIAGNOSIS — E1169 Type 2 diabetes mellitus with other specified complication: Secondary | ICD-10-CM | POA: Diagnosis not present

## 2014-02-10 DIAGNOSIS — L97509 Non-pressure chronic ulcer of other part of unspecified foot with unspecified severity: Secondary | ICD-10-CM | POA: Diagnosis not present

## 2014-02-10 NOTE — Progress Notes (Signed)
She presents today for followup of ulceration to the base of her fifth metatarsal left foot. He continues conservative therapies with this. She's also complaining of painful elongated toenails.  Objective: Pulses remain palpable bilateral. Ulceration fifth metatarsal base left foot does not demonstrate any sign of erythema edema cellulitis drainage or odor the wound has decreased in size from previous evaluation. And appears to be much more shallow. Nails are thick yellow dystrophic onychomycotic.  Assessment: Diabetic ulceration fifth metatarsal left foot area pain in limb secondary to onychomycosis 1 through 5 bilateral.  Plan: Debridement of reactive hyperkeratosis and ulceration fifth metatarsal base left foot. Debridement of nails 1 through 5 bilateral covered service secondary to pain. Followup with her in 3 weeks for the ulceration. 3 months the tendons.

## 2014-02-25 DIAGNOSIS — R0989 Other specified symptoms and signs involving the circulatory and respiratory systems: Secondary | ICD-10-CM | POA: Diagnosis not present

## 2014-02-25 DIAGNOSIS — R0609 Other forms of dyspnea: Secondary | ICD-10-CM | POA: Diagnosis not present

## 2014-02-25 DIAGNOSIS — R42 Dizziness and giddiness: Secondary | ICD-10-CM | POA: Diagnosis not present

## 2014-02-25 DIAGNOSIS — G459 Transient cerebral ischemic attack, unspecified: Secondary | ICD-10-CM | POA: Diagnosis not present

## 2014-02-25 DIAGNOSIS — R55 Syncope and collapse: Secondary | ICD-10-CM | POA: Diagnosis not present

## 2014-02-25 DIAGNOSIS — R5383 Other fatigue: Secondary | ICD-10-CM | POA: Diagnosis not present

## 2014-02-25 DIAGNOSIS — R5381 Other malaise: Secondary | ICD-10-CM | POA: Diagnosis not present

## 2014-02-25 LAB — CBC WITH DIFFERENTIAL/PLATELET
Basophil #: 0.1 10*3/uL (ref 0.0–0.1)
Basophil %: 0.8 %
Eosinophil #: 0.1 10*3/uL (ref 0.0–0.7)
Eosinophil %: 1.3 %
HCT: 33.2 % — ABNORMAL LOW (ref 35.0–47.0)
HGB: 10.6 g/dL — ABNORMAL LOW (ref 12.0–16.0)
Lymphocyte #: 2.3 10*3/uL (ref 1.0–3.6)
Lymphocyte %: 22.6 %
MCH: 28.6 pg (ref 26.0–34.0)
MCHC: 31.9 g/dL — ABNORMAL LOW (ref 32.0–36.0)
MCV: 90 fL (ref 80–100)
Monocyte #: 0.6 x10 3/mm (ref 0.2–0.9)
Monocyte %: 6.5 %
Neutrophil #: 6.9 10*3/uL — ABNORMAL HIGH (ref 1.4–6.5)
Neutrophil %: 68.8 %
Platelet: 266 10*3/uL (ref 150–440)
RBC: 3.71 10*6/uL — ABNORMAL LOW (ref 3.80–5.20)
RDW: 15.2 % — ABNORMAL HIGH (ref 11.5–14.5)
WBC: 10 10*3/uL (ref 3.6–11.0)

## 2014-02-25 LAB — URINALYSIS, COMPLETE
Bacteria: NONE SEEN
Bilirubin,UR: NEGATIVE
Blood: NEGATIVE
Glucose,UR: 50 mg/dL (ref 0–75)
Ketone: NEGATIVE
Leukocyte Esterase: NEGATIVE
Nitrite: NEGATIVE
Ph: 5 (ref 4.5–8.0)
Protein: NEGATIVE
RBC,UR: <1 /HPF (ref 0–5)
Specific Gravity: 1.008 (ref 1.003–1.030)
Squamous Epithelial: NONE SEEN
WBC UR: NONE SEEN /HPF (ref 0–5)

## 2014-02-25 LAB — BASIC METABOLIC PANEL
Anion Gap: 14 (ref 7–16)
BUN: 12 mg/dL (ref 7–18)
Calcium, Total: 7.6 mg/dL — ABNORMAL LOW (ref 8.5–10.1)
Chloride: 106 mmol/L (ref 98–107)
Co2: 22 mmol/L (ref 21–32)
Creatinine: 1.05 mg/dL (ref 0.60–1.30)
EGFR (African American): >60
EGFR (Non-African Amer.): 55 — ABNORMAL LOW
Glucose: 223 mg/dL — ABNORMAL HIGH (ref 65–99)
Osmolality: 290 (ref 275–301)
Potassium: 3.4 mmol/L — ABNORMAL LOW (ref 3.5–5.1)
Sodium: 142 mmol/L (ref 136–145)

## 2014-02-25 LAB — TROPONIN I: Troponin-I: <0.02 ng/mL

## 2014-02-26 ENCOUNTER — Inpatient Hospital Stay: Payer: Self-pay | Admitting: Internal Medicine

## 2014-02-26 ENCOUNTER — Ambulatory Visit: Payer: Self-pay | Admitting: Neurology

## 2014-02-26 DIAGNOSIS — G459 Transient cerebral ischemic attack, unspecified: Secondary | ICD-10-CM | POA: Diagnosis not present

## 2014-02-26 DIAGNOSIS — I10 Essential (primary) hypertension: Secondary | ICD-10-CM | POA: Diagnosis not present

## 2014-02-26 DIAGNOSIS — F3289 Other specified depressive episodes: Secondary | ICD-10-CM | POA: Diagnosis present

## 2014-02-26 DIAGNOSIS — M1990 Unspecified osteoarthritis, unspecified site: Secondary | ICD-10-CM | POA: Diagnosis present

## 2014-02-26 DIAGNOSIS — R42 Dizziness and giddiness: Secondary | ICD-10-CM | POA: Diagnosis not present

## 2014-02-26 DIAGNOSIS — F329 Major depressive disorder, single episode, unspecified: Secondary | ICD-10-CM | POA: Diagnosis present

## 2014-02-26 DIAGNOSIS — E860 Dehydration: Secondary | ICD-10-CM | POA: Diagnosis not present

## 2014-02-26 DIAGNOSIS — G2581 Restless legs syndrome: Secondary | ICD-10-CM | POA: Diagnosis present

## 2014-02-26 DIAGNOSIS — M5030 Other cervical disc degeneration, unspecified cervical region: Secondary | ICD-10-CM | POA: Diagnosis present

## 2014-02-26 DIAGNOSIS — R5381 Other malaise: Secondary | ICD-10-CM | POA: Diagnosis not present

## 2014-02-26 DIAGNOSIS — G4733 Obstructive sleep apnea (adult) (pediatric): Secondary | ICD-10-CM | POA: Diagnosis present

## 2014-02-26 DIAGNOSIS — G609 Hereditary and idiopathic neuropathy, unspecified: Secondary | ICD-10-CM | POA: Diagnosis present

## 2014-02-26 DIAGNOSIS — Z6841 Body Mass Index (BMI) 40.0 and over, adult: Secondary | ICD-10-CM | POA: Diagnosis not present

## 2014-02-26 DIAGNOSIS — R079 Chest pain, unspecified: Secondary | ICD-10-CM | POA: Diagnosis not present

## 2014-02-26 DIAGNOSIS — I059 Rheumatic mitral valve disease, unspecified: Secondary | ICD-10-CM | POA: Diagnosis not present

## 2014-02-26 DIAGNOSIS — R5383 Other fatigue: Secondary | ICD-10-CM | POA: Diagnosis present

## 2014-02-26 DIAGNOSIS — Z96659 Presence of unspecified artificial knee joint: Secondary | ICD-10-CM | POA: Diagnosis not present

## 2014-02-26 DIAGNOSIS — Z9884 Bariatric surgery status: Secondary | ICD-10-CM | POA: Diagnosis not present

## 2014-02-26 DIAGNOSIS — R55 Syncope and collapse: Secondary | ICD-10-CM | POA: Diagnosis not present

## 2014-02-26 DIAGNOSIS — G43109 Migraine with aura, not intractable, without status migrainosus: Secondary | ICD-10-CM | POA: Diagnosis not present

## 2014-02-26 DIAGNOSIS — Z881 Allergy status to other antibiotic agents status: Secondary | ICD-10-CM | POA: Diagnosis not present

## 2014-02-26 DIAGNOSIS — H538 Other visual disturbances: Secondary | ICD-10-CM | POA: Diagnosis not present

## 2014-02-27 DIAGNOSIS — G43109 Migraine with aura, not intractable, without status migrainosus: Secondary | ICD-10-CM | POA: Diagnosis not present

## 2014-02-27 DIAGNOSIS — G459 Transient cerebral ischemic attack, unspecified: Secondary | ICD-10-CM | POA: Diagnosis not present

## 2014-02-27 DIAGNOSIS — H538 Other visual disturbances: Secondary | ICD-10-CM | POA: Diagnosis not present

## 2014-02-27 DIAGNOSIS — R5381 Other malaise: Secondary | ICD-10-CM | POA: Diagnosis not present

## 2014-02-27 DIAGNOSIS — I10 Essential (primary) hypertension: Secondary | ICD-10-CM | POA: Diagnosis not present

## 2014-02-27 DIAGNOSIS — R55 Syncope and collapse: Secondary | ICD-10-CM | POA: Diagnosis not present

## 2014-02-27 LAB — CBC AND DIFFERENTIAL
HCT: 33 % — AB (ref 36–46)
Hemoglobin: 10.6 g/dL — AB (ref 12.0–16.0)
Platelets: 266 10*3/uL (ref 150–399)
WBC: 10 10^3/mL

## 2014-02-27 LAB — LIPID PANEL
Cholesterol: 155 mg/dL (ref 0–200)
Cholesterol: 155 mg/dL (ref 0–200)
HDL Cholesterol: 43 mg/dL (ref 40–60)
HDL: 43 mg/dL (ref 35–70)
LDL Cholesterol: 88 mg/dL
Ldl Cholesterol, Calc: 88 mg/dL (ref 0–100)
Triglycerides: 121 mg/dL (ref 0–200)
Triglycerides: 121 mg/dL (ref 40–160)
VLDL Cholesterol, Calc: 24 mg/dL (ref 5–40)

## 2014-03-02 DIAGNOSIS — IMO0001 Reserved for inherently not codable concepts without codable children: Secondary | ICD-10-CM | POA: Diagnosis not present

## 2014-03-02 DIAGNOSIS — I10 Essential (primary) hypertension: Secondary | ICD-10-CM | POA: Diagnosis not present

## 2014-03-02 DIAGNOSIS — E1149 Type 2 diabetes mellitus with other diabetic neurological complication: Secondary | ICD-10-CM | POA: Diagnosis not present

## 2014-03-02 DIAGNOSIS — R269 Unspecified abnormalities of gait and mobility: Secondary | ICD-10-CM | POA: Diagnosis not present

## 2014-03-02 DIAGNOSIS — E1142 Type 2 diabetes mellitus with diabetic polyneuropathy: Secondary | ICD-10-CM | POA: Diagnosis not present

## 2014-03-02 DIAGNOSIS — M159 Polyosteoarthritis, unspecified: Secondary | ICD-10-CM | POA: Diagnosis not present

## 2014-03-06 DIAGNOSIS — E1142 Type 2 diabetes mellitus with diabetic polyneuropathy: Secondary | ICD-10-CM | POA: Diagnosis not present

## 2014-03-06 DIAGNOSIS — M159 Polyosteoarthritis, unspecified: Secondary | ICD-10-CM | POA: Diagnosis not present

## 2014-03-06 DIAGNOSIS — E1149 Type 2 diabetes mellitus with other diabetic neurological complication: Secondary | ICD-10-CM | POA: Diagnosis not present

## 2014-03-06 DIAGNOSIS — R269 Unspecified abnormalities of gait and mobility: Secondary | ICD-10-CM | POA: Diagnosis not present

## 2014-03-06 DIAGNOSIS — IMO0001 Reserved for inherently not codable concepts without codable children: Secondary | ICD-10-CM | POA: Diagnosis not present

## 2014-03-06 DIAGNOSIS — I10 Essential (primary) hypertension: Secondary | ICD-10-CM | POA: Diagnosis not present

## 2014-03-08 DIAGNOSIS — IMO0001 Reserved for inherently not codable concepts without codable children: Secondary | ICD-10-CM | POA: Diagnosis not present

## 2014-03-08 DIAGNOSIS — I10 Essential (primary) hypertension: Secondary | ICD-10-CM | POA: Diagnosis not present

## 2014-03-08 DIAGNOSIS — M159 Polyosteoarthritis, unspecified: Secondary | ICD-10-CM | POA: Diagnosis not present

## 2014-03-08 DIAGNOSIS — R269 Unspecified abnormalities of gait and mobility: Secondary | ICD-10-CM | POA: Diagnosis not present

## 2014-03-08 DIAGNOSIS — E1142 Type 2 diabetes mellitus with diabetic polyneuropathy: Secondary | ICD-10-CM | POA: Diagnosis not present

## 2014-03-08 DIAGNOSIS — E1149 Type 2 diabetes mellitus with other diabetic neurological complication: Secondary | ICD-10-CM | POA: Diagnosis not present

## 2014-03-09 ENCOUNTER — Telehealth: Payer: Self-pay | Admitting: Internal Medicine

## 2014-03-09 NOTE — Telephone Encounter (Signed)
Pt needs a HFU for hemophagic headaches, vertigo. Please advise where to add pt to the schedule/msn

## 2014-03-10 ENCOUNTER — Ambulatory Visit (INDEPENDENT_AMBULATORY_CARE_PROVIDER_SITE_OTHER): Payer: Medicare Other

## 2014-03-10 ENCOUNTER — Encounter: Payer: Self-pay | Admitting: *Deleted

## 2014-03-10 ENCOUNTER — Telehealth: Payer: Self-pay | Admitting: *Deleted

## 2014-03-10 ENCOUNTER — Ambulatory Visit (INDEPENDENT_AMBULATORY_CARE_PROVIDER_SITE_OTHER): Payer: Medicare Other | Admitting: Podiatry

## 2014-03-10 DIAGNOSIS — M159 Polyosteoarthritis, unspecified: Secondary | ICD-10-CM | POA: Diagnosis not present

## 2014-03-10 DIAGNOSIS — IMO0001 Reserved for inherently not codable concepts without codable children: Secondary | ICD-10-CM | POA: Diagnosis not present

## 2014-03-10 DIAGNOSIS — M67479 Ganglion, unspecified ankle and foot: Secondary | ICD-10-CM

## 2014-03-10 DIAGNOSIS — R918 Other nonspecific abnormal finding of lung field: Secondary | ICD-10-CM

## 2014-03-10 DIAGNOSIS — E1142 Type 2 diabetes mellitus with diabetic polyneuropathy: Secondary | ICD-10-CM | POA: Diagnosis not present

## 2014-03-10 DIAGNOSIS — M6740 Ganglion, unspecified site: Secondary | ICD-10-CM

## 2014-03-10 DIAGNOSIS — E1149 Type 2 diabetes mellitus with other diabetic neurological complication: Secondary | ICD-10-CM | POA: Diagnosis not present

## 2014-03-10 DIAGNOSIS — E11621 Type 2 diabetes mellitus with foot ulcer: Secondary | ICD-10-CM

## 2014-03-10 DIAGNOSIS — L97509 Non-pressure chronic ulcer of other part of unspecified foot with unspecified severity: Secondary | ICD-10-CM

## 2014-03-10 DIAGNOSIS — R269 Unspecified abnormalities of gait and mobility: Secondary | ICD-10-CM | POA: Diagnosis not present

## 2014-03-10 DIAGNOSIS — I10 Essential (primary) hypertension: Secondary | ICD-10-CM | POA: Diagnosis not present

## 2014-03-10 DIAGNOSIS — L97529 Non-pressure chronic ulcer of other part of left foot with unspecified severity: Secondary | ICD-10-CM

## 2014-03-10 NOTE — Telephone Encounter (Signed)
Message copied by Rosana Berger on Wed Mar 10, 2014  5:09 PM ------      Message from: Rosana Berger      Created: Tue Sep 29, 2013  2:52 PM       Pt needs f/u ct in 6 months ------

## 2014-03-10 NOTE — Telephone Encounter (Signed)
Ok thank you 

## 2014-03-10 NOTE — Telephone Encounter (Signed)
Faxed for records

## 2014-03-10 NOTE — Telephone Encounter (Signed)
Patient went by EMS to [facility]  02/25/14 and was admitted, patient was released on 02/27/14.episodes of syncope on the 13 th that led her to call the EMS. Patient stated she had a CT of head and that a mass was found to be present that had shown in past CT scans to be larger. Patient stated she is losing spans of time. Patient has C/O headaches. Patient currently taking Pt for weakness, and stated she has periods does not know wether she is falling a sleep or having episodes of syncope. Scheduled patient for 03/24/14 at 2.30 will send for discharge summary .

## 2014-03-10 NOTE — Progress Notes (Signed)
Per dr Milinda Pointer write prescription for: dispense one single point cane  Dx: abnormal gait

## 2014-03-10 NOTE — Progress Notes (Signed)
She presents today for followup of the ulceration to the lateral aspect of the left foot. She's concerned about a similar bump on her right foot.  Objective: Vital signs are stable she is alert and oriented x3. Worthwhile to noted that she has a new pair diabetic shoes. Ulceration lateral aspect fifth metatarsal base left foot has gone to heal uneventfully I removed every last remaining scab from that area today. Right foot does demonstrate a palpable fifth metatarsal base radiographically nothing abnormal in this area.  Assessment: Well-healing ulceration left foot.  Plan: Followup with me in a few weeks after physical therapy has begun.

## 2014-03-10 NOTE — Telephone Encounter (Signed)
Order sent to [facility] for ct chest

## 2014-03-11 ENCOUNTER — Telehealth: Payer: Self-pay | Admitting: Neurology

## 2014-03-11 DIAGNOSIS — R42 Dizziness and giddiness: Secondary | ICD-10-CM | POA: Diagnosis not present

## 2014-03-11 DIAGNOSIS — H60509 Unspecified acute noninfective otitis externa, unspecified ear: Secondary | ICD-10-CM | POA: Diagnosis not present

## 2014-03-11 NOTE — Telephone Encounter (Signed)
I called patient. The patient was in the hospital recently with multiple syncopal events. The patient apparently underwent an echocardiogram that was unremarkable. She was unable to have MRI evaluation as she has an InterStim bladder stimulator. The patient indicates that she has her medical records. I'll try to get her a revisit appointment next week. The patient indicates that she is in physical therapy for ambulation. She has poor balance, she indicates that there were wide variations in her blood pressure during the hospitalization. She continues to have headaches.

## 2014-03-11 NOTE — Telephone Encounter (Signed)
Patient called and relayed that she was in [facility] August 13-15.  She went to see her ENT doctor who recommended she follow up, fairly quickly with her neurologist.  He relayed she is very dizzy and has no balance.  She has all her records and will bring them to an appt or have them faxed.  She can be reached at [phone].

## 2014-03-12 ENCOUNTER — Ambulatory Visit (INDEPENDENT_AMBULATORY_CARE_PROVIDER_SITE_OTHER): Payer: Medicare Other | Admitting: Neurology

## 2014-03-12 ENCOUNTER — Encounter: Payer: Self-pay | Admitting: Neurology

## 2014-03-12 VITALS — BP 121/64 | HR 78 | Wt 231.5 lb

## 2014-03-12 DIAGNOSIS — R55 Syncope and collapse: Secondary | ICD-10-CM | POA: Diagnosis not present

## 2014-03-12 DIAGNOSIS — R51 Headache: Secondary | ICD-10-CM | POA: Diagnosis not present

## 2014-03-12 DIAGNOSIS — E1142 Type 2 diabetes mellitus with diabetic polyneuropathy: Secondary | ICD-10-CM

## 2014-03-12 DIAGNOSIS — E1149 Type 2 diabetes mellitus with other diabetic neurological complication: Secondary | ICD-10-CM | POA: Diagnosis not present

## 2014-03-12 DIAGNOSIS — R269 Unspecified abnormalities of gait and mobility: Secondary | ICD-10-CM | POA: Diagnosis not present

## 2014-03-12 DIAGNOSIS — IMO0001 Reserved for inherently not codable concepts without codable children: Secondary | ICD-10-CM | POA: Diagnosis not present

## 2014-03-12 DIAGNOSIS — I10 Essential (primary) hypertension: Secondary | ICD-10-CM | POA: Diagnosis not present

## 2014-03-12 DIAGNOSIS — M159 Polyosteoarthritis, unspecified: Secondary | ICD-10-CM | POA: Diagnosis not present

## 2014-03-12 HISTORY — DX: Syncope and collapse: R55

## 2014-03-12 NOTE — Patient Instructions (Signed)

## 2014-03-12 NOTE — Progress Notes (Signed)
Reason for visit: Syncopal events  Denise Sharp is an 68 y.o. female  History of present illness:  Denise Sharp is a 69 year old right-handed white female with a history of a neuropathy associated with a gait disorder. The patient has frequent headache events. The patient was admitted to [facility] on 02/25/2014 with onset of some problems with dizziness, and syncopal events associated with a severe headache. The patient was noted to have some variability in her blood pressures, at times being low, at other times being elevated. 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 The patient had a carotid Doppler study that was unremarkable, and a 2-D echocardiogram reveals an ejection fraction of 60-65%, with a mildly dilated left atrium, mild mitral valve regurgitation and mild tricuspid regurgitation. No significant abnormalities were seen. The patient indicates that her walking and balance has gradually worsened over time. The patient now has a cane, and she is engaged in physical therapy, she reports no recent falls. The patient comes to this office for an evaluation. She reports no new focal numbness or weakness of the face, arms, or legs. In the past, the patient has had some episodes of "dozing off". The patient continues to have ongoing headache issues.  Past Medical History  Diagnosis Date  . Diabetes mellitus   . Depression   . Hypertension   . Cervical spinal stenosis 1994    due to trauma to back ([employer] accident), has intermittent paralysis and parasthesias  . Adrenal mass   . IBS (irritable bowel syndrome)   . Obstructive sleep apnea     using CPAP  . Anemia     iron deficient post  2 unit txfsn 2009, normal endo/colonoscopy by [facility]  . Esophageal stenosis March 2011    with transietn outlet obstruction by food, cleared by EGD   . LBBB (left bundle branch block)   . Obesity   . Apnea   .  Headache(784.0)   . Arthritis   . Restless leg syndrome   . Dizziness     chronic dizziness  . Adrenal mass, left   . 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 Abnormality of gait 03/25/2013  . Syncope and collapse 03/12/2014    Past Surgical History  Procedure Laterality Date  . Gastric bypass    . Abdominal hysterectomy    . Gastric bypass  2000, 2005    Dr. Debroah Loop  . Appendectomy    . Gallbladder surgery  1976  . Rotator cuff repair      right  . Joint replacement  2007    bilateral knee. [name],  [name]  . Spine surgery  1995    [place]  . Interstim implant placement  April 2013    Cope  . Gallbladder surgery  resection    Family History  Problem Relation Age of Onset  . Heart disease Father   . Hypertension Father   . Prostate cancer Father   . Stroke Mother   . Depression Mother   . Headache Mother     Social history:  reports that she has never smoked. She has never used smokeless tobacco. She reports that she does not drink alcohol or use illicit drugs.    Allergies  Allergen Reactions  . Diovan [Valsartan]   . Erythromycin   . Flagyl [Metronidazole]   . Glucophage [Metformin Hcl]   . Xanax [Alprazolam]   . Demeclocycline Hives  .  Erythromycin Base   . Lasix [Furosemide] Swelling    Other reaction(s): Localized superficial swelling of skin Has opposite effect  . Metformin   . Tetracyclines & Related Hives and Rash    Medications:  Current Outpatient Prescriptions on File Prior to Visit  Medication Sig Dispense Refill  . alosetron (LOTRONEX) 0.5 MG tablet Take 1 tablet (0.5 mg total) by mouth 2 (two) times daily.  180 tablet  1  . Cholecalciferol (VITAMIN D3) 10000 UNITS capsule Take 10,000 Units by mouth once a week.      . cyanocobalamin (,VITAMIN B-12,) 1000 MCG/ML injection 1000 ml (1 ml) injection twice monthly  25 mL  3  . diazepam (VALIUM) 10 MG tablet Take 1 tablet (10 mg total) by mouth at bedtime as needed for anxiety.  90 tablet   1  . ergocalciferol (DRISDOL) 50000 UNITS capsule Take 1 capsule (50,000 Units total) by mouth once a week.  12 capsule  0  . estradiol (ESTRACE) 0.5 MG tablet Take 1 tablet (0.5 mg total) by mouth daily.  90 tablet  2  . eszopiclone (LUNESTA) 2 MG TABS tablet Take 1 tablet (2 mg total) by mouth at bedtime. Take immediately before bedtime  90 tablet  1  . fluconazole (DIFLUCAN) 150 MG tablet Take 1 tablet (150 mg total) by mouth once.  2 tablet  0  . furosemide (LASIX) 20 MG tablet Take 1 tablet (20 mg total) by mouth daily.  90 tablet  3  . glipiZIDE (GLUCOTROL) 5 MG tablet Take 1 tablet (5 mg total) by mouth 2 (two) times daily before a meal.  60 tablet  3  . Glucose Blood (BAYER BREEZE 2 TEST) DISK Use one test strip each time glucose needs testing.  50 each  2  . hydrALAZINE (APRESOLINE) 25 MG tablet Take 1 tablet (25 mg total) by mouth 3 (three) times daily. As needed for bp > 160  270 tablet  1  . hydrocodone-acetaminophen (LORCET-HD) 5-500 MG per capsule Take 1 capsule by mouth every 6 (six) hours as needed.  30 capsule  3  . losartan (COZAAR) 50 MG tablet       . mupirocin ointment (BACTROBAN) 2 % Apply to affected area twice daily  22 g  0  . nebivolol (BYSTOLIC) 5 MG tablet Take 1 tablet (5 mg total) by mouth daily.  90 tablet  1  . nystatin (MYCOSTATIN) powder Apply topically 2 (two) times daily.  60 g  3  . oxyCODONE-acetaminophen (PERCOCET/ROXICET) 5-325 MG per tablet Take 1 tablet by mouth every 6 (six) hours as needed.  30 tablet  0  . pregabalin (LYRICA) 75 MG capsule Take 75 mg by mouth 2 (two) times daily.      [name] rOPINIRole (REQUIP) 1 MG tablet Take 1 tablet (1 mg total) by mouth at bedtime.  90 tablet  2  . sitaGLIPtin (JANUVIA) 100 MG tablet Take 1 tablet (100 mg total) by mouth daily.  90 tablet  2  . Tdap (BOOSTRIX) 5-2.5-18.5 LF-MCG/0.5 injection Inject 0.5 mLs into the muscle once.  0.5 mL  0  . tizanidine (ZANAFLEX) 2 MG capsule Take 1 capsule (2 mg total) by mouth 3  (three) times daily as needed for muscle spasms.  270 capsule  1  . traMADol (ULTRAM) 50 MG tablet Take 50 mg by mouth 4 (four) times daily as needed.      . zoster vaccine live, PF, (ZOSTAVAX) 28413 UNT/0.65ML injection Inject 19,400 Units into the skin  once.  1 each  0   No current facility-administered medications on file prior to visit.    ROS:  Out of a complete 14 system review of symptoms, the patient complains only of the following symptoms, and all other reviewed systems are negative.  Decreased activity level, chills, fatigue, weight change Ear pain, ringing in the ears Eye itching, sensitivity, double vision Shortness of breath, choking, chest tightness Chest pain Swollen abdomen, rectal bleeding, diarrhea Restless legs, insomnia, appendectomy, frequent waking, daytime sleepiness Incontinence of the bladder Joint pain, joint swelling, muscle cramps, walking difficulties, neck pain, neck stiffness Anemia Dizziness, headache, passing out Decreased concentration  Blood pressure 121/64, pulse 78, weight 231 lb 8 oz (105.008 kg).  Blood pressure, standing right arm is 136/80. Blood, right arm is 124/80.  Physical Exam  General: The patient is alert and cooperative at the time of the examination. The patient is markedly obese.  Skin: 1-2+ edema of ankles is noted bilaterally.   Neurologic Exam  Mental status: The patient is oriented x 3.  Cranial nerves: Facial symmetry is present. Speech is normal, no aphasia or dysarthria is noted. Extraocular movements are full. Visual fields are full.  Motor: The patient has good strength in all 4 extremities.  Sensory examination: Soft touch sensation is decreased in both legs, symmetric, symmetric in arms and face.  Coordination: The patient has good finger-nose-finger and heel-to-shin bilaterally.  Gait and station: The patient has a slightly wide-based gait. Tandem gait is slightly unsteady. Romberg is negative. No drift is  seen.  Reflexes: Deep tendon reflexes are symmetric.   Assessment/Plan:  1. Peripheral neuropathy  2. Chronic gait disorder  3. Chronic headache  4. Episodic syncope  5. Obesity  The patient has had recent episodes of apparent syncope. The patient has had a negative cerebrovascular workup. The patient will be set up for an EEG study. Her gait stability on this examination appears to be at her baseline. The patient will followup in November 2015.  Jill Alexanders MD 03/14/2014 3:08 PM  [facility] [address] [place], [place] [address]  Phone [phone] Fax [phone]

## 2014-03-15 DIAGNOSIS — M159 Polyosteoarthritis, unspecified: Secondary | ICD-10-CM | POA: Diagnosis not present

## 2014-03-15 DIAGNOSIS — E1149 Type 2 diabetes mellitus with other diabetic neurological complication: Secondary | ICD-10-CM | POA: Diagnosis not present

## 2014-03-15 DIAGNOSIS — IMO0001 Reserved for inherently not codable concepts without codable children: Secondary | ICD-10-CM | POA: Diagnosis not present

## 2014-03-15 DIAGNOSIS — R269 Unspecified abnormalities of gait and mobility: Secondary | ICD-10-CM | POA: Diagnosis not present

## 2014-03-15 DIAGNOSIS — I10 Essential (primary) hypertension: Secondary | ICD-10-CM | POA: Diagnosis not present

## 2014-03-15 DIAGNOSIS — E1142 Type 2 diabetes mellitus with diabetic polyneuropathy: Secondary | ICD-10-CM | POA: Diagnosis not present

## 2014-03-17 ENCOUNTER — Telehealth: Payer: Self-pay | Admitting: Neurology

## 2014-03-17 ENCOUNTER — Ambulatory Visit (INDEPENDENT_AMBULATORY_CARE_PROVIDER_SITE_OTHER): Payer: Medicare Other

## 2014-03-17 DIAGNOSIS — M159 Polyosteoarthritis, unspecified: Secondary | ICD-10-CM | POA: Diagnosis not present

## 2014-03-17 DIAGNOSIS — R269 Unspecified abnormalities of gait and mobility: Secondary | ICD-10-CM

## 2014-03-17 DIAGNOSIS — E1142 Type 2 diabetes mellitus with diabetic polyneuropathy: Secondary | ICD-10-CM

## 2014-03-17 DIAGNOSIS — I10 Essential (primary) hypertension: Secondary | ICD-10-CM | POA: Diagnosis not present

## 2014-03-17 DIAGNOSIS — IMO0001 Reserved for inherently not codable concepts without codable children: Secondary | ICD-10-CM | POA: Diagnosis not present

## 2014-03-17 DIAGNOSIS — R55 Syncope and collapse: Secondary | ICD-10-CM

## 2014-03-17 DIAGNOSIS — R51 Headache: Secondary | ICD-10-CM

## 2014-03-17 DIAGNOSIS — E1149 Type 2 diabetes mellitus with other diabetic neurological complication: Secondary | ICD-10-CM | POA: Diagnosis not present

## 2014-03-17 NOTE — Telephone Encounter (Signed)
I called the patient. The EEG study done today is unremarkable.

## 2014-03-17 NOTE — Procedures (Signed)
    History:  Denise Sharp is a 68 year old patient with a history of diabetes and a diabetic peripheral neuropathy, and a history of headache. More recently, she has had multiple episodes of syncope, and she is being evaluated for these events.  This is a routine EEG. No skull defects are noted. Medications include vitamin D, vitamin B12, diazepam, estradiol, Lunesta, Lasix, glipizide, hydralazine, hydrocodone, losartan, Bystolic, prednisone, Lyrica, ropinirole, Januvia, tramadol, and tizanidine.   EEG classification: Normal awake and asleep  Description of the recording: The background rhythms of this recording consists of a fairly well modulated medium amplitude background activity of 9 Hz. As the record progresses, the patient initially is in the waking state, but appears to enter the early stage II sleep during the recording, with rudimentary sleep spindles and vertex sharp wave activity seen. During the wakeful state, photic stimulation is performed, and this results in a bilateral and symmetric photic driving response. Hyperventilation was also performed, and this results in a minimal buildup of the background rhythm activities without significant slowing seen. At no time during the recording does there appear to be evidence of spike or spike wave discharges or evidence of focal slowing. EKG monitor shows no evidence of cardiac rhythm abnormalities with a heart rate of 84.  Impression: This is a normal EEG recording in the waking and sleeping state. No evidence of ictal or interictal discharges were seen at any time during the recording.

## 2014-03-18 DIAGNOSIS — R269 Unspecified abnormalities of gait and mobility: Secondary | ICD-10-CM | POA: Diagnosis not present

## 2014-03-18 DIAGNOSIS — E1142 Type 2 diabetes mellitus with diabetic polyneuropathy: Secondary | ICD-10-CM | POA: Diagnosis not present

## 2014-03-18 DIAGNOSIS — E1149 Type 2 diabetes mellitus with other diabetic neurological complication: Secondary | ICD-10-CM | POA: Diagnosis not present

## 2014-03-18 DIAGNOSIS — I10 Essential (primary) hypertension: Secondary | ICD-10-CM | POA: Diagnosis not present

## 2014-03-18 DIAGNOSIS — M159 Polyosteoarthritis, unspecified: Secondary | ICD-10-CM | POA: Diagnosis not present

## 2014-03-18 DIAGNOSIS — IMO0001 Reserved for inherently not codable concepts without codable children: Secondary | ICD-10-CM | POA: Diagnosis not present

## 2014-03-23 ENCOUNTER — Encounter: Payer: Self-pay | Admitting: Neurology

## 2014-03-23 ENCOUNTER — Ambulatory Visit (INDEPENDENT_AMBULATORY_CARE_PROVIDER_SITE_OTHER): Payer: Medicare Other | Admitting: Neurology

## 2014-03-23 VITALS — BP 120/60 | HR 77 | Wt 237.0 lb

## 2014-03-23 DIAGNOSIS — G4486 Cervicogenic headache: Secondary | ICD-10-CM | POA: Insufficient documentation

## 2014-03-23 DIAGNOSIS — G8929 Other chronic pain: Secondary | ICD-10-CM | POA: Insufficient documentation

## 2014-03-23 DIAGNOSIS — R51 Headache: Secondary | ICD-10-CM

## 2014-03-23 DIAGNOSIS — R519 Headache, unspecified: Secondary | ICD-10-CM | POA: Insufficient documentation

## 2014-03-23 HISTORY — DX: Cervicogenic headache: G44.86

## 2014-03-23 MED ORDER — PREGABALIN 50 MG PO CAPS: ORAL_CAPSULE | ORAL | Status: DC

## 2014-03-23 NOTE — Patient Instructions (Signed)

## 2014-03-23 NOTE — Progress Notes (Signed)
Reason for visit: Headache  Denise Sharp is an 68 y.o. female  History of present illness:  [name] is a 68 year old right-handed white female with a history of cervicogenic headache and a history of syncopal events. The patient underwent an EEG study recently that was completely normal. She has not had any blackout events since last seen. She returns for an evaluation. She indicates that she is on Lyrica, but she does not take this on a scheduled basis. She walks with a cane, and she has not had any further falls. The headaches are better in the morning, worse as the day goes on, mainly in the occipital area of the head and top of the head. The headache usually is better if she can take a nap.   Past Medical History  Diagnosis Date  . Diabetes mellitus   . Depression   . Hypertension   . Cervical spinal stenosis 1994    due to trauma to back ([employer] accident), has intermittent paralysis and parasthesias  . Adrenal mass   . IBS (irritable bowel syndrome)   . Obstructive sleep apnea     using CPAP  . Anemia     iron deficient post  2 unit txfsn 2009, normal endo/colonoscopy by [facility]  . Esophageal stenosis March 2011    with transietn outlet obstruction by food, cleared by EGD   . LBBB (left bundle branch block)   . Obesity   . Apnea   . Headache(784.0)   . Arthritis   . Restless leg syndrome   . Dizziness     chronic dizziness  . Adrenal mass, left   . 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 Abnormality of gait 03/25/2013  . Syncope and collapse 03/12/2014  . Cervicogenic headache 03/23/2014    Past Surgical History  Procedure Laterality Date  . Gastric bypass    . Abdominal hysterectomy    . Gastric bypass  2000, 2005    Dr. Debroah Loop  . Appendectomy    . Gallbladder surgery  1976  . Rotator cuff repair      right  . Joint replacement  2007    bilateral knee. [name],  [name]  . Spine surgery  1995    [place]  . Interstim implant placement  April  2013    Cope  . Gallbladder surgery  resection    Family History  Problem Relation Age of Onset  . Heart disease Father   . Hypertension Father   . Prostate cancer Father   . Stroke Mother   . Depression Mother   . Headache Mother     Social history:  reports that she has never smoked. She has never used smokeless tobacco. She reports that she does not drink alcohol or use illicit drugs.    Allergies  Allergen Reactions  . Diovan [Valsartan]   . Erythromycin   . Flagyl [Metronidazole]   . Glucophage [Metformin Hcl]   . Xanax [Alprazolam]   . Demeclocycline Hives  . Erythromycin Base   . Metformin   . Tetracyclines & Related Hives and Rash    Medications:  Current Outpatient Prescriptions on File Prior to Visit  Medication Sig Dispense Refill  . alosetron (LOTRONEX) 0.5 MG tablet Take 1 tablet (0.5 mg total) by mouth 2 (two) times daily.  180 tablet  1  . Cholecalciferol (VITAMIN D3) 10000 UNITS capsule Take 10,000 Units by mouth once a week.      . clindamycin (CLEOCIN) 150 MG capsule Take  150 mg by mouth 3 (three) times daily.      . cyanocobalamin (,VITAMIN B-12,) 1000 MCG/ML injection 1000 ml (1 ml) injection twice monthly  25 mL  3  . diazepam (VALIUM) 10 MG tablet Take 1 tablet (10 mg total) by mouth at bedtime as needed for anxiety.  90 tablet  1  . estradiol (ESTRACE) 0.5 MG tablet Take 1 tablet (0.5 mg total) by mouth daily.  90 tablet  2  . eszopiclone (LUNESTA) 2 MG TABS tablet Take 1 tablet (2 mg total) by mouth at bedtime. Take immediately before bedtime  90 tablet  1  . fluconazole (DIFLUCAN) 150 MG tablet Take 1 tablet (150 mg total) by mouth once.  2 tablet  0  . glipiZIDE (GLUCOTROL) 5 MG tablet Take 1 tablet (5 mg total) by mouth 2 (two) times daily before a meal.  60 tablet  3  . Glucose Blood (BAYER BREEZE 2 TEST) DISK Use one test strip each time glucose needs testing.  50 each  2  . hydrALAZINE (APRESOLINE) 25 MG tablet Take 1 tablet (25 mg total) by  mouth 3 (three) times daily. As needed for bp > 160  270 tablet  1  . hydrocodone-acetaminophen (LORCET-HD) 5-500 MG per capsule Take 1 capsule by mouth every 6 (six) hours as needed.  30 capsule  3  . losartan (COZAAR) 50 MG tablet       . nebivolol (BYSTOLIC) 5 MG tablet Take 1 tablet (5 mg total) by mouth daily.  90 tablet  1  . nystatin (MYCOSTATIN) powder Apply topically 2 (two) times daily.  60 g  3  . oxyCODONE-acetaminophen (PERCOCET/ROXICET) 5-325 MG per tablet Take 1 tablet by mouth every 6 (six) hours as needed.  30 tablet  0  . predniSONE (DELTASONE) 10 MG tablet Take 10 mg by mouth daily.      [name] rOPINIRole (REQUIP) 1 MG tablet Take 1 tablet (1 mg total) by mouth at bedtime.  90 tablet  2  . sitaGLIPtin (JANUVIA) 100 MG tablet Take 1 tablet (100 mg total) by mouth daily.  90 tablet  2  . Tdap (BOOSTRIX) 5-2.5-18.5 LF-MCG/0.5 injection Inject 0.5 mLs into the muscle once.  0.5 mL  0  . tizanidine (ZANAFLEX) 2 MG capsule Take 1 capsule (2 mg total) by mouth 3 (three) times daily as needed for muscle spasms.  270 capsule  1  . traMADol (ULTRAM) 50 MG tablet Take 50 mg by mouth 4 (four) times daily as needed.      . zoster vaccine live, PF, (ZOSTAVAX) 85462 UNT/0.65ML injection Inject 19,400 Units into the skin once.  1 each  0  . furosemide (LASIX) 20 MG tablet Take 1 tablet (20 mg total) by mouth daily.  90 tablet  3   No current facility-administered medications on file prior to visit.    ROS:  Out of a complete 14 system review of symptoms, the patient complains only of the following symptoms, and all other reviewed systems are negative.  Appetite change, activity change, fatigue Eye discharge, light sensitivity, blurred vision Shortness of breath, chest tightness Chest pain Sole and abdomen, rectal bleeding, incontinence of bowels Restless legs, insomnia, sleep apnea on CPAP, frequent waking, daytime sleepiness Joint pain, joint swelling, back pain, achy muscles, muscle  cramps, walking difficulties, neck pain, neck stiffness Dizziness, headache, weakness Confusion  Blood pressure 120/60, pulse 77, weight 237 lb (107.502 kg).  Physical Exam  General: The patient is alert and cooperative at  the time of the examination. The patient is markedly obese.  Skin: No significant peripheral edema is noted.   Neurologic Exam  Mental status: The patient is oriented x 3.  Cranial nerves: Facial symmetry is present. Speech is normal, no aphasia or dysarthria is noted. Extraocular movements are full. Visual fields are full.  Motor: The patient has good strength in all 4 extremities.  Sensory examination: Soft touch sensation of the face, arms and legs is symmetric.  Coordination: The patient has good finger-nose-finger and heel-to-shin bilaterally.  Gait and station: The patient has a slightly wide-based gait, the patient walks with a cane. Tandem gait is slightly unsteady. Romberg is negative.   Reflexes: Deep tendon reflexes are symmetric, but are depressed.   Assessment/Plan:  1. Cervicogenic headache  2. History syncope  3. Gait disorder  4. Obesity  The patient will be increased on Lyrica taking 50 mg twice daily for several weeks, then go to 1 morning and 2 in the evening. She will followup in November 2015.   Jill Alexanders MD 03/23/2014 7:29 PM  [facility] [address] [place], [place] [address]  Phone [phone] Fax [phone]

## 2014-03-24 ENCOUNTER — Encounter: Payer: Self-pay | Admitting: Internal Medicine

## 2014-03-24 ENCOUNTER — Ambulatory Visit (INDEPENDENT_AMBULATORY_CARE_PROVIDER_SITE_OTHER): Payer: Medicare Other | Admitting: Internal Medicine

## 2014-03-24 VITALS — BP 132/76 | HR 62 | Temp 99.0°F | Resp 14 | Ht 64.0 in | Wt 245.5 lb

## 2014-03-24 DIAGNOSIS — I10 Essential (primary) hypertension: Secondary | ICD-10-CM | POA: Diagnosis not present

## 2014-03-24 DIAGNOSIS — R269 Unspecified abnormalities of gait and mobility: Secondary | ICD-10-CM | POA: Diagnosis not present

## 2014-03-24 DIAGNOSIS — R55 Syncope and collapse: Secondary | ICD-10-CM | POA: Diagnosis not present

## 2014-03-24 DIAGNOSIS — E1149 Type 2 diabetes mellitus with other diabetic neurological complication: Secondary | ICD-10-CM | POA: Diagnosis not present

## 2014-03-24 DIAGNOSIS — Z8639 Personal history of other endocrine, nutritional and metabolic disease: Secondary | ICD-10-CM | POA: Diagnosis not present

## 2014-03-24 DIAGNOSIS — Z862 Personal history of diseases of the blood and blood-forming organs and certain disorders involving the immune mechanism: Secondary | ICD-10-CM

## 2014-03-24 DIAGNOSIS — D3502 Benign neoplasm of left adrenal gland: Secondary | ICD-10-CM | POA: Insufficient documentation

## 2014-03-24 DIAGNOSIS — Z9989 Dependence on other enabling machines and devices: Secondary | ICD-10-CM

## 2014-03-24 DIAGNOSIS — E1349 Other specified diabetes mellitus with other diabetic neurological complication: Secondary | ICD-10-CM

## 2014-03-24 DIAGNOSIS — E279 Disorder of adrenal gland, unspecified: Secondary | ICD-10-CM

## 2014-03-24 DIAGNOSIS — IMO0001 Reserved for inherently not codable concepts without codable children: Secondary | ICD-10-CM | POA: Diagnosis not present

## 2014-03-24 DIAGNOSIS — M159 Polyosteoarthritis, unspecified: Secondary | ICD-10-CM | POA: Diagnosis not present

## 2014-03-24 DIAGNOSIS — G4733 Obstructive sleep apnea (adult) (pediatric): Secondary | ICD-10-CM | POA: Diagnosis not present

## 2014-03-24 DIAGNOSIS — E1142 Type 2 diabetes mellitus with diabetic polyneuropathy: Secondary | ICD-10-CM | POA: Diagnosis not present

## 2014-03-24 DIAGNOSIS — E08610 Diabetes mellitus due to underlying condition with diabetic neuropathic arthropathy: Secondary | ICD-10-CM

## 2014-03-24 DIAGNOSIS — E278 Other specified disorders of adrenal gland: Secondary | ICD-10-CM

## 2014-03-24 DIAGNOSIS — Z86018 Personal history of other benign neoplasm: Secondary | ICD-10-CM

## 2014-03-24 DIAGNOSIS — E669 Obesity, unspecified: Secondary | ICD-10-CM

## 2014-03-24 MED ORDER — ZOSTER VACCINE LIVE 19400 UNT/0.65ML ~~LOC~~ SOLR: 0.6500 mL | Freq: Once | SUBCUTANEOUS | Status: DC

## 2014-03-24 MED ORDER — AMLODIPINE BESYLATE 5 MG PO TABS: 5.0000 mg | ORAL_TABLET | Freq: Every day | ORAL | Status: DC

## 2014-03-24 MED ORDER — TETANUS-DIPHTH-ACELL PERTUSSIS 5-2.5-18.5 LF-MCG/0.5 IM SUSP: 0.5000 mL | Freq: Once | INTRAMUSCULAR | Status: DC

## 2014-03-24 NOTE — Patient Instructions (Addendum)
Unsweetened almond/coconut milk is a great low calorie low carb, cholesterol and lactose  free  way to increase your dietary calcium and vitamin D.  Try the [employer]  I recommend stopping your bystolic and starting amlodipine instead until you see Dr cope for the adrenal tests  This is  my version of a  "Low GI"  Diet:  It will still lower your blood sugars and allow you to lose 4 to 8  lbs  per month if you follow it carefully.  Your goal with exercise is a minimum of 30 minutes of aerobic exercise 5 days per week (Walking does not count once it becomes easy!)      All of the foods can be found at grocery stores and in bulk at [employer].  The Atkins protein bars and shakes are available in more varieties at [employer], [employer] and [place].     7 AM Breakfast:  Choose from the following:  The high Protein low carb   Shakes that Dr Darnell Level gave you     Arnold's "Sandwhich Thin "toasted  w/ peanut butter (no jelly: about 20 net carbs  "Bagel Thin" with cream cheese and salmon: about 20 carbs   a scrambled egg/bacon/cheese burrito made with Mission's "carb balance" whole wheat tortilla  (about 10 net carbs )  A slice of home made fritatta (egg based dish without a crust:  google it)    Avoid cereal and bananas, oatmeal and cream of wheat and grits. They are loaded with carbohydrates!   10 AM: high protein snack  Protein bar by Atkins (the snack size, under 200 cal, usually < 6 net carbs).    A stick of cheese:  Around 1 carb,  100 cal     Dannon Light n Fit [place] Yogurt  (80 cal, 8 carbs)  Other so called "protein bars" and Greek yogurts tend to be loaded with carbohydrates.  Remember, in food advertising, the word "energy" is synonymous for " carbohydrate."  Lunch:   A Sandwich using the bread choices listed, Can use any  Eggs,  lunchmeat, grilled meat or canned tuna), avocado, regular mayo/mustard  and cheese.  A Salad using blue cheese, ranch,  Goddess or vinagrette,  No  croutons or "confetti" and no "candied nuts" but regular nuts OK.   No pretzels or chips.  Pickles and miniature sweet peppers are a good low carb alternative that provide a "crunch"  The bread is the only source of carbohydrate in a sandwich and  can be decreased by trying some of these alternatives to traditional loaf bread  Joseph's makes a pita bread and a flat bread that are 50 cal and 4 net carbs available at [place] and [place].  This can be toasted to use with hummous as well  Toufayan makes a low carb flatbread that's 100 cal and 9 net carbs available at [employer] and [employer]'s makes 2 sizes of  Low carb whole wheat tortilla  (The large one is 210 cal and 6 net carbs) Avoid "Low fat dressings, as well as Barry Brunner and [place] dressings They are loaded with sugar!   3 PM/ Mid day  Snack:  Consider  1 ounce of  almonds, walnuts, pistachios, pecans, peanuts,  Macadamia nuts or a nut medley.  Avoid "granola"; the dried cranberries and raisins are loaded with carbohydrates. Mixed nuts as long as there are no raisins,  cranberries or dried fruit.    Try  the prosciutto/mozzarella cheese sticks by [employer]  In deli /backery section   High protein   To avoid overindulging in snacks: Try drinking a glass of unsweeted almond/coconut milk  Or a cup of coffee with your Atkins chocolate bar  to keep you from having 3!!!        6 PM  Dinner:     Meat/fowl/fish with a green salad, and either broccoli, cauliflower, green beans, spinach, brussel sprouts or  Lima beans. DO NOT BREAD THE PROTEIN!!      There is a low carb pasta by Dreamfield's that is acceptable and tastes great: only 5 digestible carbs/serving.( All grocery stores but BJs carry it )  Try Hurley Cisco Angelo's chicken piccata or chicken or eggplant parm over low carb pasta.(Lowes and BJs)   Marjory Lies Sanchez's "Carnitas" (pulled pork, no sauce,  0 carbs) or his beef pot roast to make a dinner burrito (at [employer]J's)  Pesto over low carb pasta  (bj's sells a good quality pesto in the center refrigerated section of the deli   Try satueeing  Cheral Marker with mushroooms  Whole wheat pasta is still full of digestible carbs and  Not as low in glycemic index as Dreamfield's.   Brown rice is still rice,  So skip the rice and noodles if you eat [place] or [place] (or at least limit to 1/2 cup)  9 PM snack :     Cheese or DANNON'S LlGHT N FIT GREEK YOGURT or the [employer]ikos greek yogurt   8 ounces of [place] unsweetened almond/cococunut milk    Avoid bananas, pineapple, grapes  and watermelon on a regular basis because they are high in sugar.  THINK OF THEM AS DESSERT  Remember that snack Substitutions should be less than 10 NET carbs per serving and meals < 20 carbs. Remember to subtract fiber grams to get the "net carbs."

## 2014-03-24 NOTE — Progress Notes (Signed)
Patient ID: Denise Sharp, female   DOB: 17-May-1946, 68 y.o.   MRN: [id number]   Patient Active Problem List   Diagnosis Date Noted  . History of adrenal adenoma 03/24/2014  . Cervicogenic headache 03/23/2014  . Syncope and collapse 03/12/2014  . Unspecified vitamin D deficiency 01/06/2014  . Abdominal pain, acute, right lower quadrant 12/03/2013  . Rectal bleeding 12/03/2013  . Weight gain 12/03/2013  . Dyspnea 12/03/2013  . Headache(784.0) 11/23/2013  . Obesity, unspecified 08/25/2013  . Diarrhea 08/11/2013  . Leukocytosis, unspecified 08/07/2013  . Fatigue 08/04/2013  . Abdominal discomfort in right lower quadrant 08/04/2013  . 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 Abnormality of gait 03/25/2013  . Multiple pulmonary nodules 03/20/2013  . SOB (shortness of breath) 03/20/2013  . Postprandial vomiting 01/20/2013  . Anemia 08/06/2012  . Iron deficiency anemia 08/06/2012  . OSA on CPAP 03/02/2012  . Esophageal stenosis   . Insomnia secondary to anxiety 02/29/2012  . Bundle branch block, left 01/07/2012  . Sciatica 09/05/2011  . Cervical stenosis of spinal canal 06/14/2011  . Adrenal mass 03/13/2011  . Irritable bowel syndrome 03/13/2011  . Restless legs syndrome 03/13/2011  . Degenerative disk disease 03/13/2011  . Hypertension 03/12/2011  . 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 Hearing loss, right 03/12/2011    Subjective:  CC:   Chief Complaint  Patient presents with  . Follow-up    hospital followup    HPI:   Denise Sharp is a 68 y.o. female who presents for hospital follow up. Patient was admitted to [facility] on 8/15 and discharged after 24 hr obs after reporting several syncopal episodes which occurred at home , accompanied by headache and vertigo.  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  neurology consult found no neuro issues and did not recommend carotid dopplers but they were done anyway and were negative for significant stenosis. ECHO was normal as well,   No abnormalities found,  Not orthostatic. MRI not doen due to history of bladder stimulator. She has had follow up with Neurology; she saw Dr Denise Sharp,, EEG was clear. Cervicogenic headaches diagnosed as cause of her headache and dizziness. Lyrica dose was adjusted .  Today her chief complaint is persistent presyncope,  With recurrent episodes of vertigo. Accompanied by  labile hypertension.   History of adrenal adenoma, followed by Denise Sharp with serial imaging and catecholamine levels.   Appetite is limited,  Yet she continues to gain weight and currently  weighs 245 lbs.   She is having home  PT 3 times weekly,  Told to use a cane.  Has a home exercise bike/elliptical but not strong enough to use it yet      Past Medical History  Diagnosis Date  . Diabetes mellitus   . Depression   . Hypertension   . Cervical spinal stenosis 1994    due to trauma to back ([employer] accident), has intermittent paralysis and parasthesias  . Adrenal mass   . IBS (irritable bowel syndrome)   . Obstructive sleep apnea     using CPAP  . Anemia     iron deficient post  2 unit txfsn 2009, normal endo/colonoscopy by [facility]  . Esophageal stenosis March 2011    with transietn outlet obstruction by food, cleared by EGD   . LBBB (left bundle branch  block)   . Obesity   . Apnea   . Headache(784.0)   . Arthritis   . Restless leg syndrome   . Dizziness     chronic dizziness  . Adrenal mass, left   . 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 Abnormality of gait 03/25/2013  . Syncope and collapse 03/12/2014  . Cervicogenic headache 03/23/2014    Past Surgical History  Procedure Laterality Date  . Gastric bypass    . Abdominal hysterectomy    . Gastric bypass  2000, 2005    Dr. Debroah Loop  . Appendectomy     . Gallbladder surgery  1976  . Rotator cuff repair      right  . Joint replacement  2007    bilateral knee. [name],  [name]  . Spine surgery  1995    [place]  . Interstim implant placement  April 2013    [place]  . Gallbladder surgery  resection  . Total abdominal hysterectomy w/ bilateral salpingoophorectomy  1974       The following portions of the patient's history were reviewed and updated as appropriate: Allergies, current medications, and problem list.    Review of Systems:   Patient denies headache, fevers, malaise, unintentional weight loss, skin rash, eye pain, sinus congestion and sinus pain, sore throat, dysphagia,  hemoptysis , cough, dyspnea, wheezing, chest pain, palpitations, orthopnea, edema, abdominal pain, nausea, melena, diarrhea, constipation, flank pain, dysuria, hematuria, urinary  Frequency, nocturia, numbness, tingling, seizures,  Focal weakness, Loss of consciousness,  Tremor, insomnia, depression, anxiety, and suicidal ideation.     History   Social History  . Marital Status: Married    Spouse Name: Denise Sharp     Number of Children: 2  . Years of Education: College    Occupational History  .     Social History Main Topics  . Smoking status: Never Smoker   . Smokeless tobacco: Never Used  . Alcohol Use: No  . Drug Use: No  . Sexual Activity: Not Currently   Other Topics Concern  . Not on file   Social History Narrative   Patient lives at home with husband Denise Sharp.    Patient has 2 children.    Patient has [employer].    Patient is on disability.    Patient is right handed.     Objective:  Filed Vitals:   03/24/14 1431  BP: 132/76  Pulse: 62  Temp: 99 F (37.2 C)  Resp: 14     General appearance: alert, cooperative and appears stated age Ears: normal TM's and external ear canals both ears Throat: lips, mucosa, and tongue normal; teeth and gums normal Neck: no adenopathy, no carotid bruit, supple, symmetrical, trachea midline  and thyroid not enlarged, symmetric, no tenderness/mass/nodules Back: symmetric, no curvature. ROM normal. No CVA tenderness. Lungs: clear to auscultation bilaterally Heart: regular rate and rhythm, S1, S2 normal, no murmur, click, rub or gallop Abdomen: soft, non-tender; bowel sounds normal; no masses,  no organomegaly Pulses: 2+ and symmetric Skin: Skin color, texture, turgor normal. No rashes or lesions Lymph nodes: Cervical, supraclavicular, and axillary nodes normal.  Assessment and Plan:  Syncope and collapse Etiology is unclear, since no episodes were observed and there were no signs of orthostasis , arrythmia,  MI,  valvular disorder, or seizure activity  by recent cardiology and neurology workup during hospitalization . Patient continues to report feeling presyncopal.  Advised to maintain adequate hydration and refrain from operating automobile until etiology can be determined.  Given  her reports of labile hypertension and history of adrenal adenoma, her catecholamines will need repeat evaluation. So I have suspended her beta blocker .  Her Urology follow up is next week   Hypertension I have suspended her bystolic and instructed her to increase the amlodipine if needed pending catecholamine study to be done by Dr. Jacqlyn Larsen.  OSA on CPAP Managed with CPAP.  Last sleep study was about in 2014.   She is wearing her CPAP every night a minimum of 6 hours per night.  Will consider repeating her study if o other etiology for syncope is found. .     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 Patient is up-to-date on eye exams and foot exam is normal today. Patient is due for urine microalbumin to creatinine ratio at next visit. Patient is tolerating statin therapy for CAD risk reduction and on ACE/ARB for reduction in proteinuria.  Lab Results  Component Value Date   HGBA1C 7.2* 12/28/2013   Lab  Results  Component Value Date   MICROALBUR 6.7* 12/13/2011       Adrenal mass Recent CT of abdomen and pelvis revealed no change in size.  Annual catecholamine studies are done by Urology, and scheduled for next week, so I have suspended her bystolic for now  Obesity, unspecified She continues to gain weight rapidly despite reporting anorexia and a reduced intake of food. She has no ascites by recent abdominal ultrasound.suggested that she keep a food diary for 3 days, maintain a feeding schedule every 3 hours to stimulate metabolism.    Updated Medication List Outpatient Encounter Prescriptions as of 03/24/2014  Medication Sig  . alosetron (LOTRONEX) 0.5 MG tablet Take 1 tablet (0.5 mg total) by mouth 2 (two) times daily.  . Cholecalciferol (VITAMIN D3) 10000 UNITS capsule Take 10,000 Units by mouth once a week.  . cyanocobalamin (,VITAMIN B-12,) 1000 MCG/ML injection 1000 ml (1 ml) injection twice monthly  . diazepam (VALIUM) 10 MG tablet Take 1 tablet (10 mg total) by mouth at bedtime as needed for anxiety.  [name] estradiol (ESTRACE) 0.5 MG tablet Take 1 tablet (0.5 mg total) by mouth daily.  . eszopiclone (LUNESTA) 2 MG TABS tablet Take 1 tablet (2 mg total) by mouth at bedtime. Take immediately before bedtime  . furosemide (LASIX) 20 MG tablet Take 1 tablet (20 mg total) by mouth daily.  [name] glipiZIDE (GLUCOTROL) 5 MG tablet Take 1 tablet (5 mg total) by mouth 2 (two) times daily before a meal.  . Glucose Blood (BAYER BREEZE 2 TEST) DISK Use one test strip each time glucose needs testing.  . hydrALAZINE (APRESOLINE) 25 MG tablet Take 1 tablet (25 mg total) by mouth 3 (three) times daily. As needed for bp > 160  . hydrocodone-acetaminophen (LORCET-HD) 5-500 MG per capsule Take 1 capsule by mouth every 6 (six) hours as needed.  [name] losartan (COZAAR) 50 MG tablet   . nebivolol (BYSTOLIC) 5 MG tablet Take 1 tablet (5 mg total) by mouth daily.  [name] nystatin (MYCOSTATIN) powder Apply topically 2  (two) times daily.  [name] oxyCODONE-acetaminophen (PERCOCET/ROXICET) 5-325 MG per tablet Take 1 tablet by mouth every 6 (six) hours as needed.  . pregabalin (LYRICA) 50 MG capsule One capsule in the morning and two in the evening  . rOPINIRole (REQUIP) 1 MG tablet Take 1 tablet (1 mg total) by mouth at bedtime.  . sitaGLIPtin (JANUVIA) 100 MG tablet Take 1 tablet (100 mg total)  by mouth daily.  . tizanidine (ZANAFLEX) 2 MG capsule Take 1 capsule (2 mg total) by mouth 3 (three) times daily as needed for muscle spasms.  . traMADol (ULTRAM) 50 MG tablet Take 50 mg by mouth 4 (four) times daily as needed.  [name] amLODipine (NORVASC) 5 MG tablet Take 1 tablet (5 mg total) by mouth daily.  . Tdap (BOOSTRIX) 5-2.5-18.5 LF-MCG/0.5 injection Inject 0.5 mLs into the muscle once.  . Tdap (BOOSTRIX) 5-2.5-18.5 LF-MCG/0.5 injection Inject 0.5 mLs into the muscle once.  . zoster vaccine live, PF, (ZOSTAVAX) 16109 UNT/0.65ML injection Inject 19,400 Units into the skin once.  . zoster vaccine live, PF, (ZOSTAVAX) 60454 UNT/0.65ML injection Inject 19,400 Units into the skin once.  . [DISCONTINUED] Cholecalciferol (VITAMIN D3) 10000 UNITS capsule Take 10,000 Units by mouth daily.  . [DISCONTINUED] clindamycin (CLEOCIN) 150 MG capsule Take 150 mg by mouth 3 (three) times daily.  . [DISCONTINUED] fluconazole (DIFLUCAN) 150 MG tablet Take 1 tablet (150 mg total) by mouth once.  . [DISCONTINUED] predniSONE (DELTASONE) 10 MG tablet Take 10 mg by mouth daily.     No orders of the defined types were placed in this encounter.    Return in about 3 months (around 06/23/2014) for follow up diabetes.

## 2014-03-24 NOTE — Progress Notes (Signed)
Pre visit review using our clinic review tool, if applicable. No additional management support is needed unless otherwise documented below in the visit note. 

## 2014-03-25 DIAGNOSIS — E1149 Type 2 diabetes mellitus with other diabetic neurological complication: Secondary | ICD-10-CM | POA: Diagnosis not present

## 2014-03-25 DIAGNOSIS — R269 Unspecified abnormalities of gait and mobility: Secondary | ICD-10-CM | POA: Diagnosis not present

## 2014-03-25 DIAGNOSIS — E1142 Type 2 diabetes mellitus with diabetic polyneuropathy: Secondary | ICD-10-CM | POA: Diagnosis not present

## 2014-03-25 DIAGNOSIS — I10 Essential (primary) hypertension: Secondary | ICD-10-CM | POA: Diagnosis not present

## 2014-03-25 DIAGNOSIS — M159 Polyosteoarthritis, unspecified: Secondary | ICD-10-CM | POA: Diagnosis not present

## 2014-03-25 DIAGNOSIS — IMO0001 Reserved for inherently not codable concepts without codable children: Secondary | ICD-10-CM | POA: Diagnosis not present

## 2014-03-26 DIAGNOSIS — E1142 Type 2 diabetes mellitus with diabetic polyneuropathy: Secondary | ICD-10-CM | POA: Diagnosis not present

## 2014-03-26 DIAGNOSIS — E1149 Type 2 diabetes mellitus with other diabetic neurological complication: Secondary | ICD-10-CM | POA: Diagnosis not present

## 2014-03-26 DIAGNOSIS — IMO0001 Reserved for inherently not codable concepts without codable children: Secondary | ICD-10-CM | POA: Diagnosis not present

## 2014-03-26 DIAGNOSIS — R269 Unspecified abnormalities of gait and mobility: Secondary | ICD-10-CM | POA: Diagnosis not present

## 2014-03-26 DIAGNOSIS — I10 Essential (primary) hypertension: Secondary | ICD-10-CM | POA: Diagnosis not present

## 2014-03-26 DIAGNOSIS — M159 Polyosteoarthritis, unspecified: Secondary | ICD-10-CM | POA: Diagnosis not present

## 2014-03-26 NOTE — Assessment & Plan Note (Signed)
I have suspended her bystolic and instructed her to increase the amlodipine if needed pending catecholamine study to be done by Dr. Jacqlyn Larsen.

## 2014-03-26 NOTE — Assessment & Plan Note (Signed)
Managed with CPAP.  Last sleep study was about in 2014.   She is wearing her CPAP every night a minimum of 6 hours per night.  Will consider repeating her study if o other etiology for syncope is found. [name]

## 2014-03-26 NOTE — Assessment & Plan Note (Signed)
She continues to gain weight rapidly despite reporting anorexia and a reduced intake of food. She has no ascites by recent abdominal ultrasound.suggested that she keep a food diary for 3 days, maintain a feeding schedule every 3 hours to stimulate metabolism.

## 2014-03-26 NOTE — Assessment & Plan Note (Signed)
Historically well-controlled on current medications. Patient is up-to-date on eye exams and foot exam is normal today. Patient is due for urine microalbumin to creatinine ratio at next visit. Patient is tolerating statin therapy for CAD risk reduction and on ACE/ARB for reduction in proteinuria.  Lab Results  Component Value Date   HGBA1C 7.2* 12/28/2013   Lab Results  Component Value Date   MICROALBUR 6.7* 12/13/2011

## 2014-03-26 NOTE — Assessment & Plan Note (Signed)
Etiology is unclear, since no episodes were observed and there were no signs of orthostasis , arrythmia,  MI,  valvular disorder, or seizure activity  by recent cardiology and neurology workup during hospitalization . Patient continues to report feeling presyncopal.  Advised to maintain adequate hydration and refrain from operating automobile until etiology can be determined.  Given her reports of labile hypertension and history of adrenal adenoma, her catecholamines will need repeat evaluation. So I have suspended her beta blocker .  Her Urology follow up is next week

## 2014-03-26 NOTE — Assessment & Plan Note (Signed)
Recent CT of abdomen and pelvis revealed no change in size.  Annual catecholamine studies are done by Urology, and scheduled for next week, so I have suspended her bystolic for now

## 2014-03-29 DIAGNOSIS — E1149 Type 2 diabetes mellitus with other diabetic neurological complication: Secondary | ICD-10-CM | POA: Diagnosis not present

## 2014-03-29 DIAGNOSIS — IMO0001 Reserved for inherently not codable concepts without codable children: Secondary | ICD-10-CM | POA: Diagnosis not present

## 2014-03-29 DIAGNOSIS — R269 Unspecified abnormalities of gait and mobility: Secondary | ICD-10-CM | POA: Diagnosis not present

## 2014-03-29 DIAGNOSIS — M159 Polyosteoarthritis, unspecified: Secondary | ICD-10-CM | POA: Diagnosis not present

## 2014-03-29 DIAGNOSIS — E1142 Type 2 diabetes mellitus with diabetic polyneuropathy: Secondary | ICD-10-CM | POA: Diagnosis not present

## 2014-03-29 DIAGNOSIS — I10 Essential (primary) hypertension: Secondary | ICD-10-CM | POA: Diagnosis not present

## 2014-03-30 ENCOUNTER — Telehealth: Payer: Self-pay | Admitting: *Deleted

## 2014-03-30 DIAGNOSIS — D518 Other vitamin B12 deficiency anemias: Secondary | ICD-10-CM

## 2014-03-30 DIAGNOSIS — E119 Type 2 diabetes mellitus without complications: Secondary | ICD-10-CM

## 2014-03-30 NOTE — Telephone Encounter (Signed)
Pt is coming in tomorrow what labs and dx?  

## 2014-03-31 ENCOUNTER — Other Ambulatory Visit (INDEPENDENT_AMBULATORY_CARE_PROVIDER_SITE_OTHER): Payer: Medicare Other

## 2014-03-31 ENCOUNTER — Ambulatory Visit: Payer: Self-pay | Admitting: Internal Medicine

## 2014-03-31 DIAGNOSIS — M159 Polyosteoarthritis, unspecified: Secondary | ICD-10-CM | POA: Diagnosis not present

## 2014-03-31 DIAGNOSIS — I10 Essential (primary) hypertension: Secondary | ICD-10-CM | POA: Diagnosis not present

## 2014-03-31 DIAGNOSIS — R911 Solitary pulmonary nodule: Secondary | ICD-10-CM | POA: Diagnosis not present

## 2014-03-31 DIAGNOSIS — E1142 Type 2 diabetes mellitus with diabetic polyneuropathy: Secondary | ICD-10-CM | POA: Diagnosis not present

## 2014-03-31 DIAGNOSIS — E119 Type 2 diabetes mellitus without complications: Secondary | ICD-10-CM | POA: Diagnosis not present

## 2014-03-31 DIAGNOSIS — J984 Other disorders of lung: Secondary | ICD-10-CM | POA: Diagnosis not present

## 2014-03-31 DIAGNOSIS — D518 Other vitamin B12 deficiency anemias: Secondary | ICD-10-CM

## 2014-03-31 DIAGNOSIS — R269 Unspecified abnormalities of gait and mobility: Secondary | ICD-10-CM | POA: Diagnosis not present

## 2014-03-31 DIAGNOSIS — E1149 Type 2 diabetes mellitus with other diabetic neurological complication: Secondary | ICD-10-CM | POA: Diagnosis not present

## 2014-03-31 DIAGNOSIS — IMO0001 Reserved for inherently not codable concepts without codable children: Secondary | ICD-10-CM | POA: Diagnosis not present

## 2014-03-31 LAB — CBC WITH DIFFERENTIAL/PLATELET
Basophils Absolute: 0 10*3/uL (ref 0.0–0.1)
Basophils Relative: 0.5 % (ref 0.0–3.0)
Eosinophils Absolute: 0.1 10*3/uL (ref 0.0–0.7)
Eosinophils Relative: 1.2 % (ref 0.0–5.0)
HCT: 38.5 % (ref 36.0–46.0)
Hemoglobin: 12.6 g/dL (ref 12.0–15.0)
Lymphocytes Relative: 20.6 % (ref 12.0–46.0)
Lymphs Abs: 1.8 10*3/uL (ref 0.7–4.0)
MCHC: 32.6 g/dL (ref 30.0–36.0)
MCV: 87.6 fl (ref 78.0–100.0)
Monocytes Absolute: 0.4 10*3/uL (ref 0.1–1.0)
Monocytes Relative: 4.2 % (ref 3.0–12.0)
Neutro Abs: 6.4 10*3/uL (ref 1.4–7.7)
Neutrophils Relative %: 73.5 % (ref 43.0–77.0)
Platelets: 281 10*3/uL (ref 150.0–400.0)
RBC: 4.4 Mil/uL (ref 3.87–5.11)
RDW: 15.4 % (ref 11.5–15.5)
WBC: 8.7 10*3/uL (ref 4.0–10.5)

## 2014-03-31 LAB — HEMOGLOBIN A1C: Hgb A1c MFr Bld: 7 % — ABNORMAL HIGH (ref 4.6–6.5)

## 2014-04-01 DIAGNOSIS — N3941 Urge incontinence: Secondary | ICD-10-CM | POA: Diagnosis not present

## 2014-04-01 DIAGNOSIS — D4410 Neoplasm of uncertain behavior of unspecified adrenal gland: Secondary | ICD-10-CM | POA: Diagnosis not present

## 2014-04-01 DIAGNOSIS — R350 Frequency of micturition: Secondary | ICD-10-CM | POA: Diagnosis not present

## 2014-04-01 DIAGNOSIS — N318 Other neuromuscular dysfunction of bladder: Secondary | ICD-10-CM | POA: Diagnosis not present

## 2014-04-01 LAB — LIPID PANEL
Cholesterol: 205 mg/dL — ABNORMAL HIGH (ref 0–200)
HDL: 48.6 mg/dL (ref >39.00)
LDL Cholesterol: 125 mg/dL — ABNORMAL HIGH (ref 0–99)
NonHDL: 156.4
Total CHOL/HDL Ratio: 4
Triglycerides: 158 mg/dL — ABNORMAL HIGH (ref 0.0–149.0)
VLDL: 31.6 mg/dL (ref 0.0–40.0)

## 2014-04-01 LAB — COMPREHENSIVE METABOLIC PANEL
ALT: 15 U/L (ref 0–35)
AST: 24 U/L (ref 0–37)
Albumin: 3.2 g/dL — ABNORMAL LOW (ref 3.5–5.2)
Alkaline Phosphatase: 125 U/L — ABNORMAL HIGH (ref 39–117)
BUN: 13 mg/dL (ref 6–23)
CO2: 20 mEq/L (ref 19–32)
Calcium: 8.5 mg/dL (ref 8.4–10.5)
Chloride: 109 mEq/L (ref 96–112)
Creatinine, Ser: 1 mg/dL (ref 0.4–1.2)
GFR: 61.33 mL/min (ref >60.00)
Glucose, Bld: 184 mg/dL — ABNORMAL HIGH (ref 70–99)
Potassium: 3.5 mEq/L (ref 3.5–5.1)
Sodium: 142 mEq/L (ref 135–145)
Total Bilirubin: 0.4 mg/dL (ref 0.2–1.2)
Total Protein: 7 g/dL (ref 6.0–8.3)

## 2014-04-01 LAB — VITAMIN B12: Vitamin B-12: 549 pg/mL (ref 211–911)

## 2014-04-01 LAB — MICROALBUMIN / CREATININE URINE RATIO
Creatinine,U: 198.3 mg/dL
Microalb Creat Ratio: 3.3 mg/g (ref 0.0–30.0)
Microalb, Ur: 6.5 mg/dL — ABNORMAL HIGH (ref 0.0–1.9)

## 2014-04-01 LAB — IBC PANEL
Iron: 51 ug/dL (ref 42–145)
Saturation Ratios: 14.3 % — ABNORMAL LOW (ref 20.0–50.0)
Transferrin: 254.7 mg/dL (ref 212.0–360.0)

## 2014-04-01 LAB — FOLATE RBC: RBC Folate: 625 ng/mL (ref >280)

## 2014-04-04 ENCOUNTER — Encounter: Payer: Self-pay | Admitting: Internal Medicine

## 2014-04-06 NOTE — Telephone Encounter (Signed)
Mailed unread message to pt  

## 2014-04-07 ENCOUNTER — Telehealth: Payer: Self-pay | Admitting: Internal Medicine

## 2014-04-07 ENCOUNTER — Encounter: Payer: Self-pay | Admitting: Internal Medicine

## 2014-04-07 ENCOUNTER — Ambulatory Visit (INDEPENDENT_AMBULATORY_CARE_PROVIDER_SITE_OTHER): Payer: Medicare Other | Admitting: Podiatry

## 2014-04-07 VITALS — BP 142/81 | HR 80 | Resp 16

## 2014-04-07 DIAGNOSIS — IMO0001 Reserved for inherently not codable concepts without codable children: Secondary | ICD-10-CM | POA: Diagnosis not present

## 2014-04-07 DIAGNOSIS — L97509 Non-pressure chronic ulcer of other part of unspecified foot with unspecified severity: Secondary | ICD-10-CM

## 2014-04-07 DIAGNOSIS — E1142 Type 2 diabetes mellitus with diabetic polyneuropathy: Secondary | ICD-10-CM | POA: Diagnosis not present

## 2014-04-07 DIAGNOSIS — E1149 Type 2 diabetes mellitus with other diabetic neurological complication: Secondary | ICD-10-CM | POA: Diagnosis not present

## 2014-04-07 DIAGNOSIS — E11621 Type 2 diabetes mellitus with foot ulcer: Secondary | ICD-10-CM

## 2014-04-07 DIAGNOSIS — D4410 Neoplasm of uncertain behavior of unspecified adrenal gland: Secondary | ICD-10-CM | POA: Diagnosis not present

## 2014-04-07 DIAGNOSIS — M159 Polyosteoarthritis, unspecified: Secondary | ICD-10-CM | POA: Diagnosis not present

## 2014-04-07 DIAGNOSIS — E1169 Type 2 diabetes mellitus with other specified complication: Secondary | ICD-10-CM | POA: Diagnosis not present

## 2014-04-07 DIAGNOSIS — R269 Unspecified abnormalities of gait and mobility: Secondary | ICD-10-CM | POA: Diagnosis not present

## 2014-04-07 DIAGNOSIS — L97529 Non-pressure chronic ulcer of other part of left foot with unspecified severity: Secondary | ICD-10-CM

## 2014-04-07 DIAGNOSIS — I10 Essential (primary) hypertension: Secondary | ICD-10-CM | POA: Diagnosis not present

## 2014-04-07 NOTE — Telephone Encounter (Signed)
Spoke with the pt  She is calling for ct chest results  CT performed at [facility]  I have printed results from the PACS system and placed in Dr Gustavus Bryant lookat  Please advise thanks!

## 2014-04-07 NOTE — Telephone Encounter (Signed)
No acute changes, radiology rec repeat in one year for final f/u and I agree and placed in tickle file for recall

## 2014-04-07 NOTE — Telephone Encounter (Signed)
I spoke with patient about results and she verbalized understanding and had no questions 

## 2014-04-08 DIAGNOSIS — E1149 Type 2 diabetes mellitus with other diabetic neurological complication: Secondary | ICD-10-CM | POA: Diagnosis not present

## 2014-04-08 DIAGNOSIS — E1142 Type 2 diabetes mellitus with diabetic polyneuropathy: Secondary | ICD-10-CM | POA: Diagnosis not present

## 2014-04-08 DIAGNOSIS — M159 Polyosteoarthritis, unspecified: Secondary | ICD-10-CM | POA: Diagnosis not present

## 2014-04-08 DIAGNOSIS — R269 Unspecified abnormalities of gait and mobility: Secondary | ICD-10-CM | POA: Diagnosis not present

## 2014-04-08 DIAGNOSIS — I10 Essential (primary) hypertension: Secondary | ICD-10-CM | POA: Diagnosis not present

## 2014-04-08 DIAGNOSIS — IMO0001 Reserved for inherently not codable concepts without codable children: Secondary | ICD-10-CM | POA: Diagnosis not present

## 2014-04-08 NOTE — Progress Notes (Signed)
She presents today for followup for her ulceration to the lateral aspect of her left foot. She denies fever chills nausea vomiting muscle aches and pains and states it seems to be closed and healed well. She states it has started to hurt recently.  Objective: Vital signs are stable she is alert and oriented x3. There is no erythema edema cellulitis drainage or odor superficial ulceration has healed over completely fifth metatarsal base left foot. No signs of infection.  Assessment: Well-healing ulceration left foot.  Plan: Followup with her in 6 weeks continue conservative therapies and continue to wear diabetic shoes.

## 2014-04-15 ENCOUNTER — Encounter: Payer: Self-pay | Admitting: Nurse Practitioner

## 2014-04-16 ENCOUNTER — Telehealth: Payer: Self-pay | Admitting: Internal Medicine

## 2014-04-16 DIAGNOSIS — D3500 Benign neoplasm of unspecified adrenal gland: Secondary | ICD-10-CM

## 2014-04-16 NOTE — Telephone Encounter (Signed)
Patient stated she needs referral to Endocrinology Dr. Howell Rucks  Per OV with Dr. Jacqlyn Larsen office please advise.

## 2014-04-16 NOTE — Telephone Encounter (Signed)
Referral is in process as requested 

## 2014-04-20 ENCOUNTER — Encounter: Payer: Self-pay | Admitting: Internal Medicine

## 2014-04-29 ENCOUNTER — Ambulatory Visit (INDEPENDENT_AMBULATORY_CARE_PROVIDER_SITE_OTHER): Payer: Medicare Other | Admitting: Endocrinology

## 2014-04-29 ENCOUNTER — Encounter: Payer: Self-pay | Admitting: Endocrinology

## 2014-04-29 ENCOUNTER — Ambulatory Visit: Payer: BLUE CROSS/BLUE SHIELD | Admitting: Endocrinology

## 2014-04-29 VITALS — BP 130/70 | HR 80 | Resp 14 | Ht 64.0 in | Wt 215.2 lb

## 2014-04-29 DIAGNOSIS — E279 Disorder of adrenal gland, unspecified: Secondary | ICD-10-CM | POA: Diagnosis not present

## 2014-04-29 DIAGNOSIS — E278 Other specified disorders of adrenal gland: Secondary | ICD-10-CM

## 2014-04-29 MED ORDER — DEXAMETHASONE 1 MG PO TABS: 1.0000 mg | ORAL_TABLET | Freq: Once | ORAL | Status: DC

## 2014-04-29 NOTE — Progress Notes (Signed)
HPI  Denise Sharp is a 68 y.o.-year-old female, referred by her PCP, Dr.Tullo and her Urologist, Dr Jacqlyn Larsen, for evaluation and management  an adrenal incidentaloma.  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 Right adrenal gland is normal in size without evidence of adenoma.    I reviewed her chart and she also has a history of hypertension, obseity s/p prior gastric bypass surgeries, well controlled diabetes with polyneuropathy and episodic abdominal pain.  Reviewed Urology notes from [facility] as well.  The patient was being followed by Dr Jacqlyn Larsen for the adrenal adenoma and 24 hour urine studies for cortisol, catecholamines, metanephrines, VMA have been normal in September 2015. There is no corresponding 24 hour urine creatinine on the results, but 24 hour urine volume looks good at 1100. The patient has prior "abnormal bloodwork for her adrenal gland" based on Urology report, however I was not able to review these results on the EMR. There is a mention that this could have been related to prior use of Prednisone. She did have a normal renin level in September 2013 and a normal morning cortisol level at that time. 24 hour urine studies for fractionated catecholamines were also normal in August 2013 when done by her cardiologist, Dr Audelia Acton.   The patient reports that the adrenal tumor has been causing problems with her labile HTN, and has been mimicking MI on occasions. She has had prior normal cardiac evaluations. She reports that her BP can fluctuate between 180/115 to 90/50 in a few hours inspite of any provoking factor. She has problems with HTN since 2012, now is being treated with 2 agents. She also takes HCTZ if BP >160. Today in office, BP is well controlled.  - Denies recent use of steroids or  epidurals. Has been off the estrace tablet since past 6 weeks. Denies abnormal stretch marks, easy bruising. Weight has been going down recently, but she has concerns about weight gain after her last gastric bypass surgery. -Denies excess hair growth. - She reports that she has headaches all the time, with dizzy spells and palpitations that are constant, irrespective of BP fluctuations.  -She has multiple GI motility complaints that really affect her quality of life together with the BP fluctuations. Lately, she has been working on a project and sleeping around [employer]. Reports that normally sleeps for about 3-4 hours at night time.   Past Medical History  Diagnosis Date  . Diabetes mellitus   . Depression   . Hypertension   . Cervical spinal stenosis 1994    due to trauma to back ([employer] accident), has intermittent paralysis and parasthesias  . Adrenal mass   . IBS (irritable bowel syndrome)   . Obstructive sleep apnea     using CPAP  . Anemia     iron deficient post  2 unit txfsn 2009, normal endo/colonoscopy by [facility]  . Esophageal stenosis March 2011    with transietn outlet obstruction by food, cleared by EGD   . LBBB (left bundle branch block)   . Obesity   . Apnea   . Headache(784.0)   . Arthritis   . Restless leg syndrome   . Dizziness     chronic dizziness  . Adrenal mass, left   . 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  Abnormality of gait 03/25/2013  . Syncope and collapse 03/12/2014  . Cervicogenic headache 03/23/2014   Past Surgical History  Procedure Laterality Date  . Gastric bypass    . Abdominal hysterectomy    . Gastric bypass  2000, 2005    Dr. Debroah Loop  . Appendectomy    . Gallbladder surgery  1976  . Rotator cuff repair      right  . Joint replacement  2007    bilateral knee. [name],  [name]  . Spine surgery  1995    [place]  . Interstim implant placement  April 2013    Cope  . Gallbladder surgery  resection  . Total abdominal hysterectomy w/  bilateral salpingoophorectomy  1974   History   Social History  . Marital Status: Married    Spouse Name: [name]     Number of Children: 2  . Years of Education: College    Occupational History  .     Social History Main Topics  . Smoking status: Never Smoker   . Smokeless tobacco: Never Used  . Alcohol Use: No  . Drug Use: No  . Sexual Activity: Not Currently   Other Topics Concern  . Not on file   Social History Narrative   Patient lives at home with husband [name].    Patient has 2 children.    Patient has [employer].    Patient is on disability.    Patient is right handed.    Current Outpatient Prescriptions on File Prior to Visit  Medication Sig Dispense Refill  . alosetron (LOTRONEX) 0.5 MG tablet Take 1 tablet (0.5 mg total) by mouth 2 (two) times daily.  180 tablet  1  . Cholecalciferol (VITAMIN D3) 10000 UNITS capsule Take 10,000 Units by mouth once a week.      . cyanocobalamin (,VITAMIN B-12,) 1000 MCG/ML injection 1000 ml (1 ml) injection twice monthly  25 mL  3  . diazepam (VALIUM) 10 MG tablet Take 1 tablet (10 mg total) by mouth at bedtime as needed for anxiety.  90 tablet  1  . estradiol (ESTRACE) 0.5 MG tablet Take 1 tablet (0.5 mg total) by mouth daily.  90 tablet  2  . eszopiclone (LUNESTA) 2 MG TABS tablet Take 1 tablet (2 mg total) by mouth at bedtime. Take immediately before bedtime  90 tablet  1  . furosemide (LASIX) 20 MG tablet Take 1 tablet (20 mg total) by mouth daily.  90 tablet  3  . glipiZIDE (GLUCOTROL) 5 MG tablet Take 1 tablet (5 mg total) by mouth 2 (two) times daily before a meal.  60 tablet  3  . Glucose Blood (BAYER BREEZE 2 TEST) DISK Use one test strip each time glucose needs testing.  50 each  2  . hydrALAZINE (APRESOLINE) 25 MG tablet Take 1 tablet (25 mg total) by mouth 3 (three) times daily. As needed for bp > 160  270 tablet  1  . hydrocodone-acetaminophen (LORCET-HD) 5-500 MG per capsule Take 1 capsule by mouth every 6 (six)  hours as needed.  30 capsule  3  . losartan (COZAAR) 50 MG tablet       . nebivolol (BYSTOLIC) 5 MG tablet Take 1 tablet (5 mg total) by mouth daily.  90 tablet  1  . nystatin (MYCOSTATIN) powder Apply topically 2 (two) times daily.  60 g  3  . oxyCODONE-acetaminophen (PERCOCET/ROXICET) 5-325 MG per tablet Take 1 tablet by mouth every 6 (six) hours as needed.  30 tablet  0  . pregabalin (LYRICA) 50 MG capsule One capsule in the morning and two in the evening  270 capsule  2  . rOPINIRole (REQUIP) 1 MG tablet Take 1 tablet (1 mg total) by mouth at bedtime.  90 tablet  2  . sitaGLIPtin (JANUVIA) 100 MG tablet Take 1 tablet (100 mg total) by mouth daily.  90 tablet  2  . tizanidine (ZANAFLEX) 2 MG capsule Take 1 capsule (2 mg total) by mouth 3 (three) times daily as needed for muscle spasms.  270 capsule  1  . traMADol (ULTRAM) 50 MG tablet Take 50 mg by mouth 4 (four) times daily as needed.       No current facility-administered medications on file prior to visit.   Allergies  Allergen Reactions  . Diovan [Valsartan]   . Erythromycin   . Flagyl [Metronidazole]   . Glucophage [Metformin Hcl]   . Xanax [Alprazolam]   . Demeclocycline Hives  . Erythromycin Base   . Metformin   . Tetracyclines & Related Hives and Rash   Family History  Problem Relation Age of Onset  . Heart disease Father   . Hypertension Father   . Prostate cancer Father   . Stroke Father   . Osteoporosis Father   . Stroke Mother   . Depression Mother   . Headache Mother   . Heart disease Mother   . Thyroid disease Mother   . Hypertension Mother   . Diabetes Daughter   . Heart disease Daughter   . Hypertension Daughter   . Hypertension Son    Review of Systems: [x]  complains of  [  ] denies General:   [ x ] Recent weight change [ x ] Fatigue  [ x ] Loss of appetite Eyes: [ x ]  Vision Difficulty [  ]  Eye pain ENT: [  ]  Hearing difficulty [  ]  Difficulty Swallowing CVS: [ x ] Chest pain [x  ]   Palpitations/Irregular Heart beat [ x ]  Shortness of breath lying flat [x  ] Swelling of legs Resp: [ x ] Frequent Cough x 6 weeks [ x ] Shortness of Breath  [ x ]  Wheezing GI: [  ] Heartburn  [  ] Nausea or Vomiting  [ x ] Diarrhea [  ] Constipation  [  ] Abdominal Pain GU: [  ]  Polyuria  [  ]  nocturia Bones/joints:  [x  ]  Muscle aches  [ x ] Joint Pain  [ x ] Bone pain Skin/Hair/Nails: [  ]  Rash  [  ] New stretch marks [  ]  Itching [  ] Hair loss [  ]  Excessive hair growth Reproduction: [  ] Low sexual desire , [  ]  Women: Menstrual cycle problems [  ]  Women: Breast Discharge [  ] Men: Difficulty with erections [  ]  Men: Enlarged Breasts CNS: [ x ] Frequent Headaches [ x ] Blurry vision [  ] Tremors [  ] Seizures [ x ] Loss of consciousness (admitted august 2015) [ x ] Localized weakness Endocrine: [  x]  Excess thirst [  ]  Feeling excessively hot [ x ]  Feeling excessively cold Heme: [  ]  Easy bruising [  ]  Enlarged glands or lumps in neck Allergy: [  ]  Food allergies [  ] Environmental allergies   PE: BP 130/70  Pulse 80  Resp 14  Ht 5\' 4"  (1.626 m)  Wt 215 lb 4 oz (97.637 kg)  BMI 36.93 kg/m2  SpO2 98% Wt Readings from Last 3 Encounters:  04/29/14 215 lb 4 oz (97.637 kg)  03/24/14 245 lb 8 oz (111.358 kg)  03/23/14 237 lb (107.502 kg)   Constitutional: overweight, in NAD Eyes: PERRLA, EOMI, no exophthalmos, no lid lag, no stare ENT: moist mucous membranes, no thyromegaly, no cervical lymphadenopathy Cardiovascular: RRR, No MRG Respiratory: CTA B Gastrointestinal: abdomen soft, obese, NT, ND, BS+, no abnormal stretch marks Musculoskeletal: no deformities, strength grosslyintact in all 4 Skin: moist, warm, 1+ edema Neurological: no tremor with outstretched hands, DTR normal in all 4  ASSESSMENT: 1. Left adrenal adenoma  PLAN:  Problem List Items Addressed This Visit     Other   Adrenal mass - Primary     I discussed with the patient that is reassuring  that the CT scans have demonstrated a size stability in the past 5 years. At the next imaging interval, would however repeat CT abdomen with adrenal protocol for further determination of imaging characteristics. Would recommend repeat CT in 1 year (May 2016 unless labs ordered return abnormal).  Based on prior workup, it appears that adrenal adenoma is probably nonfunctional. Will round out this year's workup by checking DHEAS, renin/aldosterone ratio, 1mg  dexamethasone suppression test with dexamethasone level and plasma meanephrines. She will however conduct this testing next week, after resuming her normal bedtime hours. If all labs are normal, then would indicate nonfunctional adenoma.   Her BP in clinic today is well controlled. She has multiple reports BP fluctuations daily with little activity. Consider DM autonomic neuropathy versus stress/anxiety. Cosnider ambulatory continuous BP monitoring.   RTC 6 months.     Relevant Orders      DHEA-sulfate      Renin      Aldosterone      Aldosterone/Renin Ratio      Cortisol      Metanephrines, plasma      Dexamethasone, blood     Cc: Dr Edrick Oh, [facility].  Dr Verlin Grills [facility] 04/30/2014 5:03 PM

## 2014-04-29 NOTE — Patient Instructions (Signed)
  Alter your sleep time at bedtime to no later than midnight for the rest of the week.   Sunday night- take 1 mg dexamethasone tablet at 11 pm with a light snack.  Please return the following morning for blood draw between 8-9 am.  Please be well rested prior to the blood draw.  Please come back for a follow-up appointment in 6 months

## 2014-04-29 NOTE — Progress Notes (Signed)
Pre visit review using our clinic review tool, if applicable. No additional management support is needed unless otherwise documented below in the visit note. 

## 2014-04-30 ENCOUNTER — Encounter: Payer: Self-pay | Admitting: Endocrinology

## 2014-04-30 NOTE — Assessment & Plan Note (Signed)
I discussed with the patient that is reassuring that the CT scans have demonstrated a size stability in the past 5 years. At the next imaging interval, would however repeat CT abdomen with adrenal protocol for further determination of imaging characteristics. Would recommend repeat CT in 1 year (May 2016 unless labs ordered return abnormal).  Based on prior workup, it appears that adrenal adenoma is probably nonfunctional. Will round out this year's workup by checking DHEAS, renin/aldosterone ratio, 1mg  dexamethasone suppression test with dexamethasone level and plasma meanephrines. She will however conduct this testing next week, after resuming her normal bedtime hours. If all labs are normal, then would indicate nonfunctional adenoma.   Her BP in clinic today is well controlled. She has multiple reports BP fluctuations daily with little activity. Consider DM autonomic neuropathy versus stress/anxiety. Cosnider ambulatory continuous BP monitoring.   RTC 6 months.

## 2014-05-03 ENCOUNTER — Other Ambulatory Visit (INDEPENDENT_AMBULATORY_CARE_PROVIDER_SITE_OTHER): Payer: Medicare Other

## 2014-05-03 DIAGNOSIS — E279 Disorder of adrenal gland, unspecified: Secondary | ICD-10-CM | POA: Diagnosis not present

## 2014-05-03 DIAGNOSIS — E278 Other specified disorders of adrenal gland: Secondary | ICD-10-CM

## 2014-05-03 LAB — CORTISOL: Cortisol, Plasma: 1.1 ug/dL

## 2014-05-04 ENCOUNTER — Ambulatory Visit: Payer: BLUE CROSS/BLUE SHIELD | Admitting: Internal Medicine

## 2014-05-04 ENCOUNTER — Encounter: Payer: Self-pay | Admitting: *Deleted

## 2014-05-04 ENCOUNTER — Encounter: Payer: Self-pay | Admitting: Internal Medicine

## 2014-05-04 ENCOUNTER — Ambulatory Visit (INDEPENDENT_AMBULATORY_CARE_PROVIDER_SITE_OTHER): Payer: Medicare Other | Admitting: Internal Medicine

## 2014-05-04 VITALS — BP 124/72 | HR 65 | Temp 98.6°F | Resp 16 | Ht 64.0 in | Wt 236.8 lb

## 2014-05-04 DIAGNOSIS — Z23 Encounter for immunization: Secondary | ICD-10-CM | POA: Diagnosis not present

## 2014-05-04 DIAGNOSIS — R05 Cough: Secondary | ICD-10-CM

## 2014-05-04 DIAGNOSIS — R053 Chronic cough: Secondary | ICD-10-CM

## 2014-05-04 LAB — DHEA-SULFATE: DHEA-SO4: 38 ug/dL (ref 12–133)

## 2014-05-04 MED ORDER — BENZONATATE 200 MG PO CAPS: 200.0000 mg | ORAL_CAPSULE | Freq: Three times a day (TID) | ORAL | Status: DC | PRN

## 2014-05-04 MED ORDER — PANTOPRAZOLE SODIUM 40 MG PO TBEC: 40.0000 mg | DELAYED_RELEASE_TABLET | Freq: Every day | ORAL | Status: DC

## 2014-05-04 MED ORDER — HYDROCOD POLST-CHLORPHEN POLST 10-8 MG/5ML PO LQCR: 5.0000 mL | Freq: Every evening | ORAL | Status: DC | PRN

## 2014-05-04 MED ORDER — ALBUTEROL SULFATE HFA 108 (90 BASE) MCG/ACT IN AERS: 2.0000 | INHALATION_SPRAY | Freq: Four times a day (QID) | RESPIRATORY_TRACT | Status: DC | PRN

## 2014-05-04 NOTE — Progress Notes (Signed)
Patient ID: Denise Sharp, female   DOB: 1945-09-01, 68 y.o.   MRN: [id number]   Patient Active Problem List   Diagnosis Date Noted  . Chronic cough 05/05/2014  . History of adrenal adenoma 03/24/2014  . Cervicogenic headache 03/23/2014  . Syncope and collapse 03/12/2014  . Unspecified vitamin D deficiency 01/06/2014  . Abdominal pain, acute, right lower quadrant 12/03/2013  . Rectal bleeding 12/03/2013  . Weight gain 12/03/2013  . Dyspnea 12/03/2013  . Headache(784.0) 11/23/2013  . Obesity, unspecified 08/25/2013  . Diarrhea 08/11/2013  . Leukocytosis, unspecified 08/07/2013  . Fatigue 08/04/2013  . Abdominal discomfort in right lower quadrant 08/04/2013  . 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 Abnormality of gait 03/25/2013  . Multiple pulmonary nodules 03/20/2013  . SOB (shortness of breath) 03/20/2013  . Postprandial vomiting 01/20/2013  . Anemia 08/06/2012  . Iron deficiency anemia 08/06/2012  . OSA on CPAP 03/02/2012  . Esophageal stenosis   . Insomnia secondary to anxiety 02/29/2012  . Bundle branch block, left 01/07/2012  . Sciatica 09/05/2011  . Cervical stenosis of spinal canal 06/14/2011  . Adrenal mass 03/13/2011  . Irritable bowel syndrome 03/13/2011  . Restless legs syndrome 03/13/2011  . Degenerative disk disease 03/13/2011  . Hypertension 03/12/2011  . 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 Hearing loss, right 03/12/2011    Subjective:  CC:   Chief Complaint  Patient presents with  . Acute Visit  . Cough    Dry cough clear mucus with occasional yellow mucus.  . Diarrhea    Loose watery stools some mucus in bowelmovement.   By DHPI:   Denise Sharp is a 68 y.o. female who presents for Persistent  Dry cough for the past 2.5 months,   No fevers,  Sore in the right  Anterior neck area,  fells like her breathing is labored, sometimes wheezing is audible across the room.   Coughs . so hard she vomits.  Cough is not aggravated by lying down,  But she often wakes up in middle of night coughing.   [name]    Has had a CT in September ordered by dr Melvyn Novas to follow up on some  Ground glass opacities, that are unchanged.    Not taking anything for reflux  "I  dont' have reflux"  Not sure when last PFTS were done , none   in chart Had hernia repairs x 3 in Sept 2014 by Dr. Darnell Level.       Past Medical History  Diagnosis Date  . Diabetes mellitus   . Depression   . Hypertension   . Cervical spinal stenosis 1994    due to trauma to back ([employer] accident), has intermittent paralysis and parasthesias  . Adrenal mass   . IBS (irritable bowel syndrome)   . Obstructive sleep apnea     using CPAP  . Anemia     iron deficient post  2 unit txfsn 2009, normal endo/colonoscopy by [facility]  . Esophageal stenosis March 2011    with transietn outlet obstruction by food, cleared by EGD   . LBBB (left bundle branch block)   . Obesity   . Apnea   . Headache(784.0)   . Arthritis   . Restless leg syndrome   . Dizziness     chronic dizziness  . Adrenal mass, left   . 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 Abnormality of gait 03/25/2013  . Syncope and collapse 03/12/2014  . Cervicogenic headache  03/23/2014    Past Surgical History  Procedure Laterality Date  . Gastric bypass    . Abdominal hysterectomy    . Gastric bypass  2000, 2005    Dr. Debroah Loop  . Appendectomy    . Gallbladder surgery  1976  . Rotator cuff repair      right  . Joint replacement  2007    bilateral knee. [name],  [name]  . Spine surgery  1995    [place]  . Interstim implant placement  April 2013    Cope  . Gallbladder surgery  resection  . Total abdominal hysterectomy w/ bilateral salpingoophorectomy  1974       The following portions of the patient's history were reviewed and updated as appropriate: Allergies, current medications, and problem list.    Review of Systems:   Patient  denies headache, fevers, malaise, unintentional weight loss, skin rash, eye pain, sinus congestion and sinus pain, sore throat, dysphagia,  hemoptysis , cough, dyspnea, wheezing, chest pain, palpitations, orthopnea, edema, abdominal pain, nausea, melena, diarrhea, constipation, flank pain, dysuria, hematuria, urinary  Frequency, nocturia, numbness, tingling, seizures,  Focal weakness, Loss of consciousness,  Tremor, insomnia, depression, anxiety, and suicidal ideation.     History   Social History  . Marital Status: Married    Spouse Name: Denise Sharp     Number of Children: 2  . Years of Education: College    Occupational History  .     Social History Main Topics  . Smoking status: Never Smoker   . Smokeless tobacco: Never Used  . Alcohol Use: No  . Drug Use: No  . Sexual Activity: Not Currently   Other Topics Concern  . Not on file   Social History Narrative   Patient lives at home with husband Denise Sharp.    Patient has 2 children.    Patient has [employer].    Patient is on disability.    Patient is right handed.     Objective:  Filed Vitals:   05/04/14 1641  BP: 124/72  Pulse: 65  Temp: 98.6 F (37 C)  Resp: 16     General appearance: alert, cooperative and appears stated age Ears: normal TM's and external ear canals both ears Throat: lips, mucosa, and tongue normal; teeth and gums normal Neck: no adenopathy, no carotid bruit, supple, symmetrical, trachea midline and thyroid not enlarged, symmetric, no tenderness/mass/nodules Back: symmetric, no curvature. ROM normal. No CVA tenderness. Lungs: clear to auscultation bilaterally Heart: regular rate and rhythm, S1, S2 normal, no murmur, click, rub or gallop Abdomen: soft, non-tender; bowel sounds normal; no masses,  no organomegaly Pulses: 2+ and symmetric Skin: Skin color, texture, turgor normal. No rashes or lesions Lymph nodes: Cervical, supraclavicular, and axillary nodes normal.  Assessment and  Plan:  Chronic cough PFTS, PPI, cough suppressant, and albuterol MDI ordered.  I suspect her "wheezing " may be vocal cord dysfunction as she has no history of asthma or COPD but reports a remote history of vocal cord polyps requiring voice rest    Updated Medication List Outpatient Encounter Prescriptions as of 05/04/2014  Medication Sig  . Cholecalciferol (VITAMIN D3) 10000 UNITS capsule Take 10,000 Units by mouth once a week.  . cyanocobalamin (,VITAMIN B-12,) 1000 MCG/ML injection 1000 ml (1 ml) injection twice monthly  . diazepam (VALIUM) 10 MG tablet Take 1 tablet (10 mg total) by mouth at bedtime as needed for anxiety.  [name] estradiol (ESTRACE) 0.5 MG tablet Take 1 tablet (0.5 mg total) by  mouth daily.  . eszopiclone (LUNESTA) 2 MG TABS tablet Take 1 tablet (2 mg total) by mouth at bedtime. Take immediately before bedtime  . glipiZIDE (GLUCOTROL) 5 MG tablet Take 1 tablet (5 mg total) by mouth 2 (two) times daily before a meal.  . Glucose Blood (BAYER BREEZE 2 TEST) DISK Use one test strip each time glucose needs testing.  . hydrALAZINE (APRESOLINE) 25 MG tablet Take 1 tablet (25 mg total) by mouth 3 (three) times daily. As needed for bp > 160  . hydrocodone-acetaminophen (LORCET-HD) 5-500 MG per capsule Take 1 capsule by mouth every 6 (six) hours as needed.  [name] losartan (COZAAR) 50 MG tablet   . nebivolol (BYSTOLIC) 5 MG tablet Take 1 tablet (5 mg total) by mouth daily.  [name] nystatin (MYCOSTATIN) powder Apply topically 2 (two) times daily.  [name] oxyCODONE-acetaminophen (PERCOCET/ROXICET) 5-325 MG per tablet Take 1 tablet by mouth every 6 (six) hours as needed.  . pregabalin (LYRICA) 50 MG capsule One capsule in the morning and two in the evening  . rOPINIRole (REQUIP) 1 MG tablet Take 1 tablet (1 mg total) by mouth at bedtime.  . sitaGLIPtin (JANUVIA) 100 MG tablet Take 1 tablet (100 mg total) by mouth daily.  . tizanidine (ZANAFLEX) 2 MG capsule Take 1 capsule (2 mg total) by mouth 3 (three)  times daily as needed for muscle spasms.  . traMADol (ULTRAM) 50 MG tablet Take 50 mg by mouth 4 (four) times daily as needed.  [name] albuterol (PROVENTIL HFA;VENTOLIN HFA) 108 (90 BASE) MCG/ACT inhaler Inhale 2 puffs into the lungs every 6 (six) hours as needed for wheezing or shortness of breath.  . alosetron (LOTRONEX) 0.5 MG tablet Take 1 tablet (0.5 mg total) by mouth 2 (two) times daily.  . benzonatate (TESSALON) 200 MG capsule Take 1 capsule (200 mg total) by mouth 3 (three) times daily as needed for cough.  . chlorpheniramine-HYDROcodone (TUSSIONEX PENNKINETIC ER) 10-8 MG/5ML LQCR Take 5 mLs by mouth at bedtime as needed for cough.  . dexamethasone (DECADRON) 1 MG tablet Take 1 tablet (1 mg total) by mouth once. At 11pm for testing purposes  . furosemide (LASIX) 20 MG tablet Take 1 tablet (20 mg total) by mouth daily.  . [DISCONTINUED] benzonatate (TESSALON) 200 MG capsule Take 1 capsule (200 mg total) by mouth 3 (three) times daily as needed for cough.  . [DISCONTINUED] pantoprazole (PROTONIX) 40 MG tablet Take 1 tablet (40 mg total) by mouth daily.     No orders of the defined types were placed in this encounter.    No Follow-up on file.

## 2014-05-04 NOTE — Progress Notes (Signed)
Pre-visit discussion using our clinic review tool. No additional management support is needed unless otherwise documented below in the visit note.  

## 2014-05-04 NOTE — Patient Instructions (Signed)
Your cough may be coming from reflux, allergies, asthma or chronic irritation from coughing!!   I am starting you on protonix  And a cough suppressant to break the cycle of the cough .    I am also ordering pulmonary  function tests to rule out asthma ,  Bonnita Nasuti will call you with the appointment for the pulmonary function tests

## 2014-05-05 ENCOUNTER — Other Ambulatory Visit: Payer: Self-pay | Admitting: *Deleted

## 2014-05-05 ENCOUNTER — Ambulatory Visit (INDEPENDENT_AMBULATORY_CARE_PROVIDER_SITE_OTHER): Payer: Medicare Other | Admitting: Podiatry

## 2014-05-05 VITALS — BP 121/71 | HR 100 | Resp 18

## 2014-05-05 DIAGNOSIS — B351 Tinea unguium: Secondary | ICD-10-CM | POA: Diagnosis not present

## 2014-05-05 DIAGNOSIS — R05 Cough: Secondary | ICD-10-CM | POA: Insufficient documentation

## 2014-05-05 DIAGNOSIS — R053 Chronic cough: Secondary | ICD-10-CM | POA: Insufficient documentation

## 2014-05-05 DIAGNOSIS — E11621 Type 2 diabetes mellitus with foot ulcer: Secondary | ICD-10-CM

## 2014-05-05 DIAGNOSIS — L97529 Non-pressure chronic ulcer of other part of left foot with unspecified severity: Secondary | ICD-10-CM

## 2014-05-05 MED ORDER — PANTOPRAZOLE SODIUM 40 MG PO TBEC: 40.0000 mg | DELAYED_RELEASE_TABLET | Freq: Every day | ORAL | Status: DC

## 2014-05-05 NOTE — Assessment & Plan Note (Addendum)
PFTS, PPI, cough suppressant, and albuterol MDI ordered.  I suspect her "wheezing " may be vocal cord dysfunction as she has no history of asthma or COPD but reports a remote history of vocal cord polyps requiring voice rest

## 2014-05-05 NOTE — Progress Notes (Signed)
She presents today for followup of an ulcer fifth metatarsal base left foot.  Objective: Pulses are palpable left foot. No ulceration present.  Assessment: Well-healing ulcerative lesion fifth met base left.  Plan: Followup with me as needed for nail debridement.

## 2014-05-07 ENCOUNTER — Encounter: Payer: Self-pay | Admitting: *Deleted

## 2014-05-07 LAB — METANEPHRINES, PLASMA
Metanephrine, Free: <25 pg/mL (ref <=57)
Normetanephrine, Free: 54 pg/mL (ref <=148)
Total Metanephrines-Plasma: 54 pg/mL (ref <=205)

## 2014-05-07 LAB — RENIN: Renin Activity: 1.11 ng/mL/h (ref 0.25–5.82)

## 2014-05-07 LAB — ALDOSTERONE: Aldosterone, Serum: 2 ng/dL

## 2014-05-09 LAB — ALDOSTERONE/RENIN RATIO
ALDOSTERONE: <1 ng/dL (ref 0.0–30.0)
Renin: 1.1 ng/mL/hr

## 2014-05-10 ENCOUNTER — Encounter: Payer: Self-pay | Admitting: *Deleted

## 2014-05-10 LAB — DEXAMETHASONE, BLOOD: Dexamethasone, Serum: 197 ng/dL

## 2014-05-11 ENCOUNTER — Encounter: Payer: Self-pay | Admitting: *Deleted

## 2014-05-16 ENCOUNTER — Other Ambulatory Visit: Payer: Self-pay | Admitting: Internal Medicine

## 2014-05-24 NOTE — Telephone Encounter (Signed)
Mailed unread message to pt  

## 2014-05-26 ENCOUNTER — Ambulatory Visit: Payer: Medicare Other | Admitting: Nurse Practitioner

## 2014-05-27 ENCOUNTER — Ambulatory Visit: Payer: Medicare Other | Admitting: Nurse Practitioner

## 2014-05-31 ENCOUNTER — Encounter: Payer: Self-pay | Admitting: Adult Health

## 2014-05-31 ENCOUNTER — Ambulatory Visit (INDEPENDENT_AMBULATORY_CARE_PROVIDER_SITE_OTHER): Payer: Medicare Other | Admitting: Adult Health

## 2014-05-31 VITALS — Temp 98.3°F | Ht 64.0 in | Wt 232.0 lb

## 2014-05-31 DIAGNOSIS — G8929 Other chronic pain: Secondary | ICD-10-CM

## 2014-05-31 DIAGNOSIS — R269 Unspecified abnormalities of gait and mobility: Secondary | ICD-10-CM

## 2014-05-31 DIAGNOSIS — E1142 Type 2 diabetes mellitus with diabetic polyneuropathy: Secondary | ICD-10-CM | POA: Diagnosis not present

## 2014-05-31 DIAGNOSIS — M542 Cervicalgia: Secondary | ICD-10-CM

## 2014-05-31 DIAGNOSIS — G4486 Cervicogenic headache: Secondary | ICD-10-CM

## 2014-05-31 DIAGNOSIS — R51 Headache: Secondary | ICD-10-CM | POA: Diagnosis not present

## 2014-05-31 DIAGNOSIS — R296 Repeated falls: Secondary | ICD-10-CM

## 2014-05-31 MED ORDER — PREGABALIN 50 MG PO CAPS: ORAL_CAPSULE | ORAL | Status: DC

## 2014-05-31 NOTE — Progress Notes (Signed)
I have read the note, and I agree with the clinical assessment and plan.  Arsen Mangione KEITH   

## 2014-05-31 NOTE — Progress Notes (Signed)
PATIENT: Denise Sharp DOB: July 11, 1946  REASON FOR VISIT: follow up HISTORY FROM: patient  HISTORY OF PRESENT ILLNESS: Denise Sharp is a 68 year old female with a history of cervicogenic headaches and a history of syncopal events. The patient returns today for follow-up. She is currently taking Lyrica 50 mg twice a day. She reports that her headaches have Improved some. She reports that her biggest concern is her ongoing balance issues and recent falls. The patient has had 4-5 falls since the last visit. She  tripped over a cord causing her to fall. Most of her falls are due to her tripping over her own feet. No significant injuries from the falls. She uses a cane to ambulate but does not use it all the time. She states that she was not using the cane when she has fallen in the past. She has scooter that she will use for long distances.. She has been to physical therapy in the past. She recently finished about 6 weeks ago. Patient wants to have a MRI of the brain. She has been unable to have this in the past due to a stent in her bladder. However she states that Dr. Jacqlyn Larsen at [facility] told her that they will do the MRI there. The patient has had ongoing neck discomfort. She has been seen by Dr. Joya Salm who has stated that she is not a candidate for surgery. She also has peripheral neuropathy due to diabetes.   HISTORY 03/23/14 ([name]): 68 year old right-handed white female with a history of cervicogenic headache and a history of syncopal events. The patient underwent an EEG study recently that was completely normal. She has not had any blackout events since last seen. She returns for an evaluation. She indicates that she is on Lyrica, but she does not take this on a scheduled basis. She walks with a cane, and she has not had any further falls. The headaches are better in the morning, worse as the day goes on, mainly in the occipital area of the head and top of the head. The headache usually is better if  she can take a nap.  REVIEW OF SYSTEMS: Out of a complete 14 system review of symptoms, the patient complains only of the following symptoms, and all other reviewed systems are negative.  Activity change, fatigue, unexplained weight change Hearing loss, ear pain, runny nose Eye Discharge Cough, wheezing, shortness of breath, choking, chest tightness Chest pain, leg swelling, palpitations Swollen abdomen, abdominal pain, diarrhea, incontinence of bowels Restless legs, insomnia, apnea, daytime sleepiness Incontinence of bladder Joint pain, joint swelling, back pain, aching muscles, muscle cramps, walking difficulty, neck pain, neck stiffness Dizziness, headache, weakness Confusion, decreased concentration     ALLERGIES: Allergies  Allergen Reactions  . Diovan [Valsartan]   . Erythromycin   . Flagyl [Metronidazole]   . Glucophage [Metformin Hcl]   . Xanax [Alprazolam]   . Amoxicillin Diarrhea  . Demeclocycline Hives  . Erythromycin Base   . Metformin   . Tetracyclines & Related Hives and Rash    HOME MEDICATIONS: Outpatient Prescriptions Prior to Visit  Medication Sig Dispense Refill  . albuterol (PROVENTIL HFA;VENTOLIN HFA) 108 (90 BASE) MCG/ACT inhaler Inhale 2 puffs into the lungs every 6 (six) hours as needed for wheezing or shortness of breath. 2 Inhaler 3  . alosetron (LOTRONEX) 0.5 MG tablet Take 1 tablet (0.5 mg total) by mouth 2 (two) times daily. (Patient taking differently: Take 1 mg by mouth 2 (two) times daily. ) 180 tablet 1  .  chlorpheniramine-HYDROcodone (TUSSIONEX PENNKINETIC ER) 10-8 MG/5ML LQCR Take 5 mLs by mouth at bedtime as needed for cough. 200 mL 0  . Cholecalciferol (VITAMIN D3) 10000 UNITS capsule Take 10,000 Units by mouth once a week.    . cyanocobalamin (,VITAMIN B-12,) 1000 MCG/ML injection 1000 ml (1 ml) injection twice monthly 25 mL 3  . diazepam (VALIUM) 10 MG tablet Take 1 tablet (10 mg total) by mouth at bedtime as needed for anxiety. 90  tablet 1  . estradiol (ESTRACE) 0.5 MG tablet Take 1 tablet (0.5 mg total) by mouth daily. 90 tablet 2  . eszopiclone (LUNESTA) 2 MG TABS tablet Take 1 tablet (2 mg total) by mouth at bedtime. Take immediately before bedtime 90 tablet 1  . Glucose Blood (BAYER BREEZE 2 TEST) DISK Use one test strip each time glucose needs testing. 50 each 2  . hydrALAZINE (APRESOLINE) 25 MG tablet Take 1 tablet (25 mg total) by mouth 3 (three) times daily. As needed for bp > 160 270 tablet 1  . hydrocodone-acetaminophen (LORCET-HD) 5-500 MG per capsule Take 1 capsule by mouth every 6 (six) hours as needed. 30 capsule 3  . losartan (COZAAR) 50 MG tablet     . nebivolol (BYSTOLIC) 5 MG tablet Take 1 tablet (5 mg total) by mouth daily. 90 tablet 1  . nystatin (MYCOSTATIN) powder Apply topically 2 (two) times daily. 60 g 3  . pantoprazole (PROTONIX) 40 MG tablet Take 1 tablet (40 mg total) by mouth daily. 90 tablet 1  . rOPINIRole (REQUIP) 1 MG tablet Take 1 tablet (1 mg total) by mouth at bedtime. 90 tablet 2  . sitaGLIPtin (JANUVIA) 100 MG tablet Take 1 tablet (100 mg total) by mouth daily. 90 tablet 2  . traMADol (ULTRAM) 50 MG tablet Take 50 mg by mouth 4 (four) times daily as needed.    . tizanidine (ZANAFLEX) 2 MG capsule Take 1 capsule (2 mg total) by mouth 3 (three) times daily as needed for muscle spasms. 270 capsule 1  . benzonatate (TESSALON) 200 MG capsule Take 1 capsule (200 mg total) by mouth 3 (three) times daily as needed for cough. 180 capsule 1  . furosemide (LASIX) 20 MG tablet Take 1 tablet (20 mg total) by mouth daily. 90 tablet 3  . oxyCODONE-acetaminophen (PERCOCET/ROXICET) 5-325 MG per tablet Take 1 tablet by mouth every 6 (six) hours as needed. 30 tablet 0  . amLODipine (NORVASC) 5 MG tablet TAKE 1 TABLET (5 MG TOTAL) BY MOUTH DAILY. 30 tablet 0  . dexamethasone (DECADRON) 1 MG tablet Take 1 tablet (1 mg total) by mouth once. At 11pm for testing purposes 1 tablet 0  . glipiZIDE (GLUCOTROL) 5  MG tablet Take 1 tablet (5 mg total) by mouth 2 (two) times daily before a meal. 60 tablet 3  . pregabalin (LYRICA) 50 MG capsule One capsule in the morning and two in the evening 270 capsule 2   No facility-administered medications prior to visit.    PAST MEDICAL HISTORY: Past Medical History  Diagnosis Date  . Diabetes mellitus   . Depression   . Hypertension   . Cervical spinal stenosis 1994    due to trauma to back ([employer] accident), has intermittent paralysis and parasthesias  . Adrenal mass   . IBS (irritable bowel syndrome)   . Obstructive sleep apnea     using CPAP  . Anemia     iron deficient post  2 unit txfsn 2009, normal endo/colonoscopy by [facility]  . Esophageal  stenosis March 2011    with transietn outlet obstruction by food, cleared by EGD   . LBBB (left bundle branch block)   . Obesity   . Apnea   . Headache(784.0)   . Arthritis   . Restless leg syndrome   . Dizziness     chronic dizziness  . Adrenal mass, left   . 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 Abnormality of gait 03/25/2013  . Syncope and collapse 03/12/2014  . Cervicogenic headache 03/23/2014    PAST SURGICAL HISTORY: Past Surgical History  Procedure Laterality Date  . Gastric bypass    . Abdominal hysterectomy    . Gastric bypass  2000, 2005    Dr. Debroah Loop  . Appendectomy    . Gallbladder surgery  1976  . Rotator cuff repair      right  . Joint replacement  2007    bilateral knee. [name],  [name]  . Spine surgery  1995    Botero  . Interstim implant placement  April 2013    Cope  . Gallbladder surgery  resection  . Total abdominal hysterectomy w/ bilateral salpingoophorectomy  1974    FAMILY HISTORY: Family History  Problem Relation Age of Onset  . Heart disease Father   . Hypertension Father   . Prostate cancer Father   . Stroke Father   . Osteoporosis Father   . Stroke Mother   . Depression Mother   . Headache Mother   . Heart disease Mother   . Thyroid disease  Mother   . Hypertension Mother   . Diabetes Daughter   . Heart disease Daughter   . Hypertension Daughter   . Hypertension Son     SOCIAL HISTORY: History   Social History  . Marital Status: Married    Spouse Name: [name]     Number of Children: 2  . Years of Education: College    Occupational History  .     Social History Main Topics  . Smoking status: Never Smoker   . Smokeless tobacco: Never Used  . Alcohol Use: No  . Drug Use: No  . Sexual Activity: Not Currently   Other Topics Concern  . Not on file   Social History Narrative   Patient lives at home with husband [name].    Patient has 2 children.    Patient has [employer].    Patient is on disability.    Patient is right handed.       PHYSICAL EXAM  Filed Vitals:   05/31/14 1337  Temp: 98.3 F (36.8 C)  TempSrc: Oral  Height: 5\' 4"  (1.626 m)  Weight: 232 lb (105.235 kg)   Body mass index is 39.8 kg/(m^2).  Generalized: Well developed, in no acute distress   Neurological examination  Mentation: Alert oriented to time, place, history taking. Follows all commands speech and language fluent Cranial nerve II-XII: Extraocular movements were full, visual field were full on confrontational test. Facial sensation and strength were normal. Head turning and shoulder shrug  were normal and symmetric. Motor: The motor testing reveals 5 over 5 strength of all 4 extremities. Good symmetric motor tone is noted throughout.  Sensory: Sensory testing is intact to soft touch on all 4 extremities. No evidence of extinction is noted.  Coordination: Cerebellar testing reveals good finger-nose-finger and heel-to-shin bilaterally.  Gait and station: Gait is slightly wide-based, the patient is using a cane to ambulate. Tandem gait not attempted. Romberg is negative. No drift is seen.  Reflexes:  Deep tendon reflexes are symmetric and normal bilaterally.    DIAGNOSTIC DATA (LABS, IMAGING, TESTING) - I reviewed patient  records, labs, notes, testing and imaging myself where available.  Lab Results  Component Value Date   WBC 8.7 03/31/2014   HGB 12.6 03/31/2014   HCT 38.5 03/31/2014   MCV 87.6 03/31/2014   PLT 281.0 03/31/2014      Component Value Date/Time   NA 142 03/31/2014 1050   K 3.5 03/31/2014 1050   CL 109 03/31/2014 1050   CO2 20 03/31/2014 1050   GLUCOSE 184* 03/31/2014 1050   BUN 13 03/31/2014 1050   CREATININE 1.0 03/31/2014 1050   CREATININE 0.87 12/13/2011 0919   CALCIUM 8.5 03/31/2014 1050   PROT 7.0 03/31/2014 1050   ALBUMIN 3.2* 03/31/2014 1050   AST 24 03/31/2014 1050   ALT 15 03/31/2014 1050   ALKPHOS 125* 03/31/2014 1050   BILITOT 0.4 03/31/2014 1050   GFRNONAA 70 12/13/2011 0919   GFRAA 80 12/13/2011 0919   Lab Results  Component Value Date   CHOL 205* 03/31/2014   HDL 48.60 03/31/2014   LDLCALC 125* 03/31/2014   TRIG 158.0* 03/31/2014   CHOLHDL 4 03/31/2014   Lab Results  Component Value Date   HGBA1C 7.0* 03/31/2014   Lab Results  Component Value Date   VITAMINB12 549 03/31/2014   Lab Results  Component Value Date   TSH 0.93 12/03/2013      ASSESSMENT AND PLAN 68 y.o. year old female  has a past medical history of Diabetes mellitus; Depression; Hypertension; Cervical spinal stenosis (1994); Adrenal mass; IBS (irritable bowel syndrome); Obstructive sleep apnea; Anemia; Esophageal stenosis (March 2011); LBBB (left bundle branch block); Obesity; Apnea; Headache(784.0); Arthritis; Restless leg syndrome; Dizziness; Adrenal mass, left; Polyneuropathy in diabetes(357.2) (03/25/2013); Abnormality of gait (03/25/2013); Syncope and collapse (03/12/2014); and Cervicogenic headache (03/23/2014). here with:  1. Chronic neck pain 2. Cervicogenic headaches 3. Frequent falls 4. Abnormality of gait  I discussed this patient's care with Dr. Jannifer Franklin. At this time the patient may benefit from neuromuscular therapy for her chronic neck pain and cervicogenic headaches. The  patient is amenable to this. In the future we may increase the Lyrica. I have encouraged the patient to use her cane at all times in order to prevent future falls. The patient has already been to physical therapy for balance and gait, she just finished 6 weeks ago. At this time I do not think the patient needs a MRI of the brain and Dr. Jannifer Franklin is in agreement. If the patient's symptoms worsen or she develops new symptoms she should let us know. Otherwise she will follow up in 4 months or sooner if needed.   Ward Givens, MSN, NP-C 05/31/2014, 1:55 PM [facility] [address], [place], [place] [address] [phone]  Note: This document was prepared with digital dictation and possible smart phrase technology. Any transcriptional errors that result from this process are unintentional.

## 2014-06-08 ENCOUNTER — Other Ambulatory Visit: Payer: Self-pay | Admitting: Internal Medicine

## 2014-06-08 DIAGNOSIS — R05 Cough: Secondary | ICD-10-CM

## 2014-06-08 DIAGNOSIS — R059 Cough, unspecified: Secondary | ICD-10-CM

## 2014-06-14 ENCOUNTER — Ambulatory Visit: Payer: Self-pay | Admitting: Internal Medicine

## 2014-06-14 DIAGNOSIS — Z0189 Encounter for other specified special examinations: Secondary | ICD-10-CM | POA: Diagnosis not present

## 2014-06-14 DIAGNOSIS — R05 Cough: Secondary | ICD-10-CM | POA: Diagnosis not present

## 2014-06-14 LAB — PULMONARY FUNCTION TEST

## 2014-06-16 ENCOUNTER — Encounter: Payer: Self-pay | Admitting: Podiatry

## 2014-06-16 ENCOUNTER — Ambulatory Visit (INDEPENDENT_AMBULATORY_CARE_PROVIDER_SITE_OTHER): Payer: Medicare Other | Admitting: Podiatry

## 2014-06-16 DIAGNOSIS — M79676 Pain in unspecified toe(s): Secondary | ICD-10-CM | POA: Diagnosis not present

## 2014-06-16 DIAGNOSIS — B351 Tinea unguium: Secondary | ICD-10-CM | POA: Diagnosis not present

## 2014-06-16 DIAGNOSIS — E11621 Type 2 diabetes mellitus with foot ulcer: Secondary | ICD-10-CM

## 2014-06-16 DIAGNOSIS — L97529 Non-pressure chronic ulcer of other part of left foot with unspecified severity: Secondary | ICD-10-CM

## 2014-06-17 NOTE — Progress Notes (Signed)
She presents then should complaint of painful elongated toenails 1 through 5 bilateral.  Objective: Nails are thick yellow dystrophic ligament mycotic. Ulceration to the lateral aspect left foot is going to heal uneventfully.  Assessment: Pain limb secondary to onychomycosis 1 through 5 bilateral. Well-healed ulceration left.  Plan: Debridement of nails 1 through 5 bilateral service secondary to pain.

## 2014-06-20 LAB — PULMONARY FUNCTION TEST

## 2014-06-21 ENCOUNTER — Telehealth: Payer: Self-pay | Admitting: *Deleted

## 2014-06-21 ENCOUNTER — Emergency Department: Payer: Self-pay | Admitting: Student

## 2014-06-21 DIAGNOSIS — M549 Dorsalgia, unspecified: Secondary | ICD-10-CM | POA: Diagnosis not present

## 2014-06-21 DIAGNOSIS — I10 Essential (primary) hypertension: Secondary | ICD-10-CM | POA: Diagnosis not present

## 2014-06-21 DIAGNOSIS — R0789 Other chest pain: Secondary | ICD-10-CM | POA: Diagnosis not present

## 2014-06-21 DIAGNOSIS — R079 Chest pain, unspecified: Secondary | ICD-10-CM | POA: Diagnosis not present

## 2014-06-21 DIAGNOSIS — G8929 Other chronic pain: Secondary | ICD-10-CM | POA: Diagnosis not present

## 2014-06-21 DIAGNOSIS — G43909 Migraine, unspecified, not intractable, without status migrainosus: Secondary | ICD-10-CM | POA: Diagnosis not present

## 2014-06-21 DIAGNOSIS — R05 Cough: Secondary | ICD-10-CM | POA: Diagnosis not present

## 2014-06-21 DIAGNOSIS — E119 Type 2 diabetes mellitus without complications: Secondary | ICD-10-CM | POA: Diagnosis not present

## 2014-06-21 LAB — URINALYSIS, COMPLETE
Bacteria: NONE SEEN
Bilirubin,UR: NEGATIVE
Glucose,UR: NEGATIVE mg/dL (ref 0–75)
Hyaline Cast: 6
Ketone: NEGATIVE
Leukocyte Esterase: NEGATIVE
Nitrite: NEGATIVE
Ph: 5 (ref 4.5–8.0)
Protein: NEGATIVE
RBC,UR: NONE SEEN /HPF (ref 0–5)
Specific Gravity: 1.006 (ref 1.003–1.030)
Squamous Epithelial: 2
WBC UR: <1 /HPF (ref 0–5)

## 2014-06-21 LAB — BASIC METABOLIC PANEL
Anion Gap: 11 (ref 7–16)
BUN: 12 mg/dL (ref 7–18)
Calcium, Total: 8.7 mg/dL (ref 8.5–10.1)
Chloride: 106 mmol/L (ref 98–107)
Co2: 24 mmol/L (ref 21–32)
Creatinine: 1.02 mg/dL (ref 0.60–1.30)
EGFR (African American): >60
EGFR (Non-African Amer.): 57 — ABNORMAL LOW
Glucose: 174 mg/dL — ABNORMAL HIGH (ref 65–99)
Osmolality: 285 (ref 275–301)
Potassium: 4 mmol/L (ref 3.5–5.1)
Sodium: 141 mmol/L (ref 136–145)

## 2014-06-21 LAB — CBC
HCT: 41.4 % (ref 35.0–47.0)
HGB: 13.1 g/dL (ref 12.0–16.0)
MCH: 27.8 pg (ref 26.0–34.0)
MCHC: 31.6 g/dL — ABNORMAL LOW (ref 32.0–36.0)
MCV: 88 fL (ref 80–100)
Platelet: 308 10*3/uL (ref 150–440)
RBC: 4.7 10*6/uL (ref 3.80–5.20)
RDW: 15.4 % — ABNORMAL HIGH (ref 11.5–14.5)
WBC: 11.5 10*3/uL — ABNORMAL HIGH (ref 3.6–11.0)

## 2014-06-21 LAB — TROPONIN I: Troponin-I: <0.02 ng/mL

## 2014-06-21 NOTE — Telephone Encounter (Signed)
Pt had been talking to triage nurse who advised her to call 911. EMT came to evaluate patient. She is having chest pain, difficulty breathing, and blood sugar 365. BP was 119/80, pulse ox 96 %, heart rate normal. Pt states she could barely walk to bathroom without labored breathing. EMTs advised her to go to ED. She called the office to ask advice. Advised pt to be seen in ED for her symptoms and at EMT recommendation.  FYI

## 2014-06-21 NOTE — Telephone Encounter (Signed)
Talked with Dr. Edd Fabian whom stated patient had an un-remarkable work up today and is being discharged home with follow up here on 06/23/14

## 2014-06-22 DIAGNOSIS — Z1231 Encounter for screening mammogram for malignant neoplasm of breast: Secondary | ICD-10-CM | POA: Diagnosis not present

## 2014-06-23 ENCOUNTER — Encounter: Payer: Self-pay | Admitting: Internal Medicine

## 2014-06-23 ENCOUNTER — Ambulatory Visit (INDEPENDENT_AMBULATORY_CARE_PROVIDER_SITE_OTHER): Payer: Medicare Other | Admitting: Internal Medicine

## 2014-06-23 VITALS — BP 114/68 | HR 68 | Temp 98.9°F | Resp 16 | Ht 64.0 in | Wt 240.2 lb

## 2014-06-23 DIAGNOSIS — E08610 Diabetes mellitus due to underlying condition with diabetic neuropathic arthropathy: Secondary | ICD-10-CM

## 2014-06-23 DIAGNOSIS — R0600 Dyspnea, unspecified: Secondary | ICD-10-CM

## 2014-06-23 DIAGNOSIS — M5430 Sciatica, unspecified side: Secondary | ICD-10-CM

## 2014-06-23 DIAGNOSIS — R296 Repeated falls: Secondary | ICD-10-CM

## 2014-06-23 LAB — HM DIABETES FOOT EXAM: HM Diabetic Foot Exam: ABNORMAL

## 2014-06-23 NOTE — Progress Notes (Signed)
Patient ID: Denise Sharp, female   DOB: 1946-05-27, 68 y.o.   MRN: [id number]  Patient Active Problem List   Diagnosis Date Noted  . Frequent falls 06/23/2014  . Chronic cough 05/05/2014  . History of adrenal adenoma 03/24/2014  . Cervicogenic headache 03/23/2014  . Syncope and collapse 03/12/2014  . Unspecified vitamin D deficiency 01/06/2014  . Abdominal pain, acute, right lower quadrant 12/03/2013  . Rectal bleeding 12/03/2013  . Weight gain 12/03/2013  . Dyspnea 12/03/2013  . Headache(784.0) 11/23/2013  . Obesity, unspecified 08/25/2013  . Diarrhea 08/11/2013  . Leukocytosis, unspecified 08/07/2013  . Fatigue 08/04/2013  . Abdominal discomfort in right lower quadrant 08/04/2013  . Diabetic polyneuropathy 03/25/2013  . Abnormality of gait 03/25/2013  . Multiple pulmonary nodules 03/20/2013  . SOB (shortness of breath) 03/20/2013  . Postprandial vomiting 01/20/2013  . Anemia 08/06/2012  . Iron deficiency anemia 08/06/2012  . OSA on CPAP 03/02/2012  . Esophageal stenosis   . Insomnia secondary to anxiety 02/29/2012  . Bundle branch block, left 01/07/2012  . Sciatica 09/05/2011  . Cervical stenosis of spinal canal 06/14/2011  . Adrenal mass 03/13/2011  . Irritable bowel syndrome 03/13/2011  . Restless legs syndrome 03/13/2011  . Degenerative disk disease 03/13/2011  . Hypertension 03/12/2011  . 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 Hearing loss, right 03/12/2011    Subjective:  CC:   Chief Complaint  Patient presents with  . Follow-up    3 month follow up for diabetes    HPI:   Denise Sharp is a 68 y.o. female who presents for Follow upon ER visit.  On Monday morning started feeling dizzy,  Bad headache,. CBG 358 ,  And BP 112/70 HR 112.  Did not have any insulin .  Was aso short of breath and having chest pain,  EMT evaluation confirmed elevated  CBG  And advised her to go to Er.  In ER CBG  was 170 and WBC was 11.5 .  She has been eating too many starches taking only [place] OR glipziide ,  And blood sugars have been more elevated.   She has been falling  a lot.  She has been seeing Dr. Milinda Pointer who is treating ulcer on foot,  Has neuropathy makes her trip a lot. Has a scooter,  A walker and a  Kasandra Knudsen,  Sees neurology    Past Medical History  Diagnosis Date  . Diabetes mellitus   . Depression   . Hypertension   . Cervical spinal stenosis 1994    due to trauma to back ([employer] accident), has intermittent paralysis and parasthesias  . Adrenal mass   . IBS (irritable bowel syndrome)   . Obstructive sleep apnea     using CPAP  . Anemia     iron deficient post  2 unit txfsn 2009, normal endo/colonoscopy by [facility]  . Esophageal stenosis March 2011    with transietn outlet obstruction by food, cleared by EGD   . LBBB (left bundle branch block)   . Obesity   . Apnea   . Headache(784.0)   . Arthritis   . Restless leg syndrome   . Dizziness     chronic dizziness  . Adrenal mass, left   . 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 Abnormality of gait 03/25/2013  . Syncope and collapse 03/12/2014  . Cervicogenic headache 03/23/2014    Past Surgical History  Procedure Laterality Date  . Gastric bypass    .  Abdominal hysterectomy    . Gastric bypass  2000, 2005    Dr. Debroah Loop  . Appendectomy    . Gallbladder surgery  1976  . Rotator cuff repair      right  . Joint replacement  2007    bilateral knee. [name],  [name]  . Spine surgery  1995    [place]  . Interstim implant placement  April 2013    Cope  . Gallbladder surgery  resection  . Total abdominal hysterectomy w/ bilateral salpingoophorectomy  1974       The following portions of the patient's history were reviewed and updated as appropriate: Allergies, current medications, and problem list.    Review of Systems:   Patient denies headache, fevers, malaise, unintentional weight loss, skin rash, eye pain,  sinus congestion and sinus pain, sore throat, dysphagia,  hemoptysis , cough, dyspnea, wheezing, chest pain, palpitations, orthopnea, edema, abdominal pain, nausea, melena, diarrhea, constipation, flank pain, dysuria, hematuria, urinary  Frequency, nocturia, numbness, tingling, seizures,  Focal weakness, Loss of consciousness,  Tremor, insomnia, depression, anxiety, and suicidal ideation.     History   Social History  . Marital Status: Married    Spouse Name: [name]     Number of Children: 2  . Years of Education: College    Occupational History  .     Social History Main Topics  . Smoking status: Never Smoker   . Smokeless tobacco: Never Used  . Alcohol Use: No  . Drug Use: No  . Sexual Activity: Not Currently   Other Topics Concern  . Not on file   Social History Narrative   Patient lives at home with husband [name].    Patient has 2 children.    Patient has [employer].    Patient is on disability.    Patient is right handed.     Objective:  Filed Vitals:   06/23/14 1436  BP: 114/68  Pulse: 68  Temp: 98.9 F (37.2 C)  Resp: 16     General appearance: alert, cooperative and appears stated age Ears: normal TM's and external ear canals both ears Throat: lips, mucosa, and tongue normal; teeth and gums normal Neck: no adenopathy, no carotid bruit, supple, symmetrical, trachea midline and thyroid not enlarged, symmetric, no tenderness/mass/nodules Back: symmetric, no curvature. ROM normal. No CVA tenderness. Lungs: clear to auscultation bilaterally Heart: regular rate and rhythm, S1, S2 normal, no murmur, click, rub or gallop Abdomen: soft, non-tender; bowel sounds normal; no masses,  no organomegaly Pulses: 2+ and symmetric Skin: Skin color, texture, turgor normal. No rashes or lesions Lymph nodes: Cervical, supraclavicular, and axillary nodes normal.  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  Advised to continue Januvia daily and add glipizide for CBG > 160   Sciatica Her gait is antalgic .  She is using a cane on most days but also has a walker and a scooter.   Dyspnea Chronic, secondary to deconditioning .  Recent ER visit was normal.   Frequent falls Complicated by Right knee replacment by Para March,  4 falls in november,  worried she may have hurt it by landing on it has a scooter.  Saw neurologist who recommended use of can,  i am recommend use of walker     Updated Medication List Outpatient Encounter Prescriptions as of 06/23/2014  Medication Sig  . albuterol (PROVENTIL HFA;VENTOLIN HFA) 108 (90 BASE) MCG/ACT  inhaler Inhale 2 puffs into the lungs every 6 (six) hours as needed for wheezing or shortness of breath.  . alosetron (LOTRONEX) 0.5 MG tablet Take 1 tablet (0.5 mg total) by mouth 2 (two) times daily. (Patient taking differently: Take 1 mg by mouth 2 (two) times daily. )  . benzonatate (TESSALON) 200 MG capsule Take 1 capsule (200 mg total) by mouth 3 (three) times daily as needed for cough.  . chlorpheniramine-HYDROcodone (TUSSIONEX PENNKINETIC ER) 10-8 MG/5ML LQCR Take 5 mLs by mouth at bedtime as needed for cough.  . Cholecalciferol (VITAMIN D3) 10000 UNITS capsule Take 10,000 Units by mouth once a week.  . cyanocobalamin (,VITAMIN B-12,) 1000 MCG/ML injection 1000 ml (1 ml) injection twice monthly  . diazepam (VALIUM) 10 MG tablet Take 1 tablet (10 mg total) by mouth at bedtime as needed for anxiety.  [name] estradiol (ESTRACE) 0.5 MG tablet Take 1 tablet (0.5 mg total) by mouth daily.  . eszopiclone (LUNESTA) 2 MG TABS tablet Take 1 tablet (2 mg total) by mouth at bedtime. Take immediately before bedtime  . furosemide (LASIX) 20 MG tablet Take 1 tablet (20 mg total) by mouth daily.  . Glucose Blood (BAYER BREEZE 2 TEST) DISK Use one test strip each time glucose needs testing.  . hydrALAZINE  (APRESOLINE) 25 MG tablet Take 1 tablet (25 mg total) by mouth 3 (three) times daily. As needed for bp > 160  . hydrocodone-acetaminophen (LORCET-HD) 5-500 MG per capsule Take 1 capsule by mouth every 6 (six) hours as needed.  [name] losartan (COZAAR) 50 MG tablet   . nebivolol (BYSTOLIC) 5 MG tablet Take 1 tablet (5 mg total) by mouth daily.  [name] nystatin (MYCOSTATIN) powder Apply topically 2 (two) times daily.  [name] oxyCODONE-acetaminophen (PERCOCET/ROXICET) 5-325 MG per tablet Take 1 tablet by mouth every 6 (six) hours as needed.  . pantoprazole (PROTONIX) 40 MG tablet Take 1 tablet (40 mg total) by mouth daily.  . pregabalin (LYRICA) 50 MG capsule Take 1 tablet in the morning and 2 tablets in the evening.  [name] rOPINIRole (REQUIP) 1 MG tablet Take 1 tablet (1 mg total) by mouth at bedtime.  . sitaGLIPtin (JANUVIA) 100 MG tablet Take 1 tablet (100 mg total) by mouth daily.  . traMADol (ULTRAM) 50 MG tablet Take 50 mg by mouth 4 (four) times daily as needed.     Orders Placed This Encounter  Procedures  . HM DIABETES FOOT EXAM    Return in about 3 months (around 09/22/2014) for follow up diabetes.

## 2014-06-23 NOTE — Progress Notes (Signed)
Pre-visit discussion using our clinic review tool. No additional management support is needed unless otherwise documented below in the visit note.  

## 2014-06-23 NOTE — Patient Instructions (Addendum)
Continue januvia on a daily basis   Add glipizide for CBG 160 or higher pre meal, maximum dose is 10 mg twice daily   Your blood sugars are probably high due to [employer]!!  Avoid raisin bran and has browns if you can

## 2014-06-25 NOTE — Assessment & Plan Note (Signed)
Complicated by Right knee replacment by Para March,  4 falls in november,  worried she may have hurt it by landing on it has a scooter.  Saw neurologist who recommended use of can,  i am recommend use of walker

## 2014-06-25 NOTE — Assessment & Plan Note (Signed)
Recent episodes of hyperglycemia discussed and related to diet.  Advised to continue Januvia daily and add glipizide for CBG > 160

## 2014-06-25 NOTE — Assessment & Plan Note (Signed)
Chronic, secondary to deconditioning .  Recent ER visit was normal.

## 2014-06-25 NOTE — Assessment & Plan Note (Signed)
Her gait is antalgic .  She is using a cane on most days but also has a walker and a scooter.

## 2014-06-28 ENCOUNTER — Telehealth: Payer: Self-pay | Admitting: Internal Medicine

## 2014-06-28 DIAGNOSIS — R05 Cough: Secondary | ICD-10-CM

## 2014-06-28 DIAGNOSIS — R053 Chronic cough: Secondary | ICD-10-CM

## 2014-06-28 NOTE — Telephone Encounter (Signed)
Pulmonary function tests done Jun 14 2014,  No evidence of  obstructive lung disease and  methacholine test is negative. (Kasa) her lung volumes were small suggestive that her body habitus is contributing by crowding her lungs a little bit .  There is nothing that can help that except decreasing her size, and I knwo she is trying to work on that

## 2014-06-28 NOTE — Telephone Encounter (Signed)
Left message for patient to return call to office. 

## 2014-06-29 NOTE — Telephone Encounter (Signed)
Patient notified and voiced understanding.

## 2014-07-07 ENCOUNTER — Encounter: Payer: Self-pay | Admitting: Endocrinology

## 2014-07-29 ENCOUNTER — Encounter: Payer: Self-pay | Admitting: Internal Medicine

## 2014-08-03 ENCOUNTER — Encounter: Payer: Self-pay | Admitting: Nurse Practitioner

## 2014-08-03 ENCOUNTER — Ambulatory Visit (INDEPENDENT_AMBULATORY_CARE_PROVIDER_SITE_OTHER): Payer: Medicare Other | Admitting: Nurse Practitioner

## 2014-08-03 VITALS — BP 122/78 | HR 79 | Temp 98.3°F | Resp 14 | Ht 64.0 in | Wt 235.8 lb

## 2014-08-03 DIAGNOSIS — H6691 Otitis media, unspecified, right ear: Secondary | ICD-10-CM | POA: Diagnosis not present

## 2014-08-03 MED ORDER — LEVOFLOXACIN 500 MG PO TABS: 500.0000 mg | ORAL_TABLET | Freq: Every day | ORAL | Status: DC

## 2014-08-03 MED ORDER — NEBIVOLOL HCL 5 MG PO TABS: 5.0000 mg | ORAL_TABLET | Freq: Every day | ORAL | Status: DC

## 2014-08-03 MED ORDER — GLUCOSE BLOOD VI DISK: DISK | Status: DC

## 2014-08-03 MED ORDER — NYSTATIN 100000 UNIT/GM EX POWD: Freq: Two times a day (BID) | CUTANEOUS | Status: DC

## 2014-08-03 MED ORDER — HYDRALAZINE HCL 25 MG PO TABS: 25.0000 mg | ORAL_TABLET | Freq: Three times a day (TID) | ORAL | Status: DC

## 2014-08-03 MED ORDER — SITAGLIPTIN PHOSPHATE 100 MG PO TABS: 100.0000 mg | ORAL_TABLET | Freq: Every day | ORAL | Status: DC

## 2014-08-03 MED ORDER — BENZONATATE 200 MG PO CAPS: 200.0000 mg | ORAL_CAPSULE | Freq: Three times a day (TID) | ORAL | Status: DC | PRN

## 2014-08-03 MED ORDER — FLUTICASONE PROPIONATE 50 MCG/ACT NA SUSP: 2.0000 | Freq: Every day | NASAL | Status: DC

## 2014-08-03 NOTE — Progress Notes (Signed)
Subjective:    Patient ID: Denise Sharp, female    DOB: Apr 10, 1946, 69 y.o.   MRN: [id number]  HPI  Denise Sharp  Is a 69 yo female with a CC of cough and nasal congestion.   1) [name] and Dr. Melvyn Novas have been working on Cough over a year with no apparent cause she reports. Last week went to funeral in a lot of wind.   Coughing productive- white/yellow, runny nose, ear pain right, right side of neck sore, x 5 days  Not using inhaler [facility] band allergy/antihistamine- twice, not helpful Not drinking water, uses flavored water only  Review of Systems  Constitutional: Positive for fatigue. Negative for fever, chills, diaphoresis and appetite change.       No appetite.   HENT: Positive for congestion, postnasal drip, rhinorrhea, sneezing and sore throat. Negative for ear discharge, ear pain and sinus pressure.        Right ear "feels plugged up"  Eyes: Negative for visual disturbance.  Respiratory: Positive for cough and wheezing.   Gastrointestinal: Positive for diarrhea. Negative for nausea, vomiting and abdominal pain.       Mucous in stool  Skin: Negative for rash.       Itching all over  Neurological: Positive for light-headedness. Negative for weakness, numbness and headaches.  Psychiatric/Behavioral: Positive for sleep disturbance.       Coughing, legs painful   Past Medical History  Diagnosis Date  . Diabetes mellitus   . Depression   . Hypertension   . Cervical spinal stenosis 1994    due to trauma to back ([employer] accident), has intermittent paralysis and parasthesias  . Adrenal mass   . IBS (irritable bowel syndrome)   . Obstructive sleep apnea     using CPAP  . Anemia     iron deficient post  2 unit txfsn 2009, normal endo/colonoscopy by [facility]  . Esophageal stenosis March 2011    with transietn outlet obstruction by food, cleared by EGD   . LBBB (left bundle branch block)   . Obesity   . Apnea   . Headache(784.0)   . Arthritis   . Restless leg syndrome     . Dizziness     chronic dizziness  . Adrenal mass, left   . 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 Abnormality of gait 03/25/2013  . Syncope and collapse 03/12/2014  . Cervicogenic headache 03/23/2014    History   Social History  . Marital Status: Married    Spouse Name: [name]     Number of Children: 2  . Years of Education: College    Occupational History  .     Social History Main Topics  . Smoking status: Never Smoker   . Smokeless tobacco: Never Used  . Alcohol Use: No  . Drug Use: No  . Sexual Activity: Not Currently   Other Topics Concern  . Not on file   Social History Narrative   Patient lives at home with husband [name].    Patient has 2 children.    Patient has [employer].    Patient is on disability.    Patient is right handed.     Past Surgical History  Procedure Laterality Date  . Gastric bypass    . Abdominal hysterectomy    . Gastric bypass  2000, 2005    Dr. Debroah Loop  . Appendectomy    . Gallbladder surgery  1976  . Rotator cuff repair  right  . Joint replacement  2007    bilateral knee. [name],  [name]  . Spine surgery  1995    [place]  . Interstim implant placement  April 2013    Cope  . Gallbladder surgery  resection  . Total abdominal hysterectomy w/ bilateral salpingoophorectomy  1974    Family History  Problem Relation Age of Onset  . Heart disease Father   . Hypertension Father   . Prostate cancer Father   . Stroke Father   . Osteoporosis Father   . Stroke Mother   . Depression Mother   . Headache Mother   . Heart disease Mother   . Thyroid disease Mother   . Hypertension Mother   . Diabetes Daughter   . Heart disease Daughter   . Hypertension Daughter   . Hypertension Son     Allergies  Allergen Reactions  . Diovan [Valsartan]   . Erythromycin   . Flagyl [Metronidazole]   . Glucophage [Metformin Hcl]   . Xanax [Alprazolam]   . Amoxicillin Diarrhea  . Demeclocycline Hives  . Erythromycin Base    . Metformin   . Tetracyclines & Related Hives and Rash    Current Outpatient Prescriptions on File Prior to Visit  Medication Sig Dispense Refill  . albuterol (PROVENTIL HFA;VENTOLIN HFA) 108 (90 BASE) MCG/ACT inhaler Inhale 2 puffs into the lungs every 6 (six) hours as needed for wheezing or shortness of breath. 2 Inhaler 3  . chlorpheniramine-HYDROcodone (TUSSIONEX PENNKINETIC ER) 10-8 MG/5ML LQCR Take 5 mLs by mouth at bedtime as needed for cough. 200 mL 0  . Cholecalciferol (VITAMIN D3) 10000 UNITS capsule Take 10,000 Units by mouth once a week.    . cyanocobalamin (,VITAMIN B-12,) 1000 MCG/ML injection 1000 ml (1 ml) injection twice monthly 25 mL 3  . diazepam (VALIUM) 10 MG tablet Take 1 tablet (10 mg total) by mouth at bedtime as needed for anxiety. 90 tablet 1  . estradiol (ESTRACE) 0.5 MG tablet Take 1 tablet (0.5 mg total) by mouth daily. 90 tablet 2  . eszopiclone (LUNESTA) 2 MG TABS tablet Take 1 tablet (2 mg total) by mouth at bedtime. Take immediately before bedtime 90 tablet 1  . furosemide (LASIX) 20 MG tablet Take 1 tablet (20 mg total) by mouth daily. 90 tablet 3  . hydrocodone-acetaminophen (LORCET-HD) 5-500 MG per capsule Take 1 capsule by mouth every 6 (six) hours as needed. 30 capsule 3  . losartan (COZAAR) 50 MG tablet     . oxyCODONE-acetaminophen (PERCOCET/ROXICET) 5-325 MG per tablet Take 1 tablet by mouth every 6 (six) hours as needed. 30 tablet 0  . pantoprazole (PROTONIX) 40 MG tablet Take 1 tablet (40 mg total) by mouth daily. 90 tablet 1  . pregabalin (LYRICA) 50 MG capsule Take 1 tablet in the morning and 2 tablets in the evening. 90 capsule 2  . rOPINIRole (REQUIP) 1 MG tablet Take 1 tablet (1 mg total) by mouth at bedtime. 90 tablet 2  . traMADol (ULTRAM) 50 MG tablet Take 50 mg by mouth 4 (four) times daily as needed.    [name] alosetron (LOTRONEX) 0.5 MG tablet Take 1 tablet (0.5 mg total) by mouth 2 (two) times daily. (Patient not taking: Reported on 08/03/2014)  180 tablet 1   No current facility-administered medications on file prior to visit.       Objective:   Physical Exam  Constitutional: She is oriented to person, place, and time. She appears well-developed and well-nourished. No distress.  BP 122/78 mmHg  Pulse 79  Temp(Src) 98.3 F (36.8 C) (Oral)  Resp 14  Ht 5\' 4"  (1.626 m)  Wt 235 lb 12.8 oz (106.958 kg)  BMI 40.45 kg/m2  SpO2 97%   HENT:  Head: Normocephalic and atraumatic.  Right Ear: External ear normal.  Left Ear: External ear normal.  Right TM was injected.   Eyes: Conjunctivae and EOM are normal. Pupils are equal, round, and reactive to light. Right eye exhibits no discharge. Left eye exhibits no discharge. No scleral icterus.  Neck: Normal range of motion. Neck supple. No thyromegaly present.  Cardiovascular: Normal rate, regular rhythm, normal heart sounds and intact distal pulses.  Exam reveals no gallop and no friction rub.   No murmur heard. Pulmonary/Chest: Effort normal and breath sounds normal. No respiratory distress. She has no wheezes. She has no rales. She exhibits no tenderness.  Lymphadenopathy:    She has no cervical adenopathy.  Neurological: She is alert and oriented to person, place, and time. No cranial nerve deficit. She exhibits normal muscle tone. Coordination normal.  Skin: Skin is warm and dry. No rash noted. She is not diaphoretic.  Psychiatric: She has a normal mood and affect. Her behavior is normal. Judgment and thought content normal.        Assessment & Plan:

## 2014-08-03 NOTE — Progress Notes (Signed)
Pre visit review using our clinic review tool, if applicable. No additional management support is needed unless otherwise documented below in the visit note. 

## 2014-08-03 NOTE — Patient Instructions (Signed)
Please wait 3 days. If you are not feeling any better please take the antibiotic.   Call us if you are worsening or failing to improve.

## 2014-08-04 DIAGNOSIS — H6690 Otitis media, unspecified, unspecified ear: Secondary | ICD-10-CM | POA: Insufficient documentation

## 2014-08-04 NOTE — Assessment & Plan Note (Addendum)
Worsening. Rx for flonase to help with eustacian tube. Rx for levaquin instructed to wait 3 days and see if there is improvement in symptoms before taking. Gave abx because pt is allergic to most antibiotic classes and it has good coverage for bacterial causes of coughing as well 500 mg x 7 days. Also, snow is forecasted and our offices may be closed. FU prn.

## 2014-08-13 ENCOUNTER — Telehealth: Payer: Self-pay | Admitting: Internal Medicine

## 2014-08-13 ENCOUNTER — Ambulatory Visit (INDEPENDENT_AMBULATORY_CARE_PROVIDER_SITE_OTHER): Payer: Medicare Other | Admitting: Internal Medicine

## 2014-08-13 VITALS — BP 106/80 | HR 81 | Temp 98.1°F | Resp 14 | Ht 64.0 in | Wt 240.5 lb

## 2014-08-13 DIAGNOSIS — E119 Type 2 diabetes mellitus without complications: Secondary | ICD-10-CM | POA: Diagnosis not present

## 2014-08-13 DIAGNOSIS — R5383 Other fatigue: Secondary | ICD-10-CM

## 2014-08-13 DIAGNOSIS — E662 Morbid (severe) obesity with alveolar hypoventilation: Secondary | ICD-10-CM | POA: Insufficient documentation

## 2014-08-13 DIAGNOSIS — R42 Dizziness and giddiness: Secondary | ICD-10-CM

## 2014-08-13 DIAGNOSIS — E669 Obesity, unspecified: Secondary | ICD-10-CM | POA: Diagnosis not present

## 2014-08-13 DIAGNOSIS — R55 Syncope and collapse: Secondary | ICD-10-CM | POA: Diagnosis not present

## 2014-08-13 DIAGNOSIS — G629 Polyneuropathy, unspecified: Secondary | ICD-10-CM

## 2014-08-13 DIAGNOSIS — F32A Depression, unspecified: Secondary | ICD-10-CM

## 2014-08-13 DIAGNOSIS — F329 Major depressive disorder, single episode, unspecified: Secondary | ICD-10-CM | POA: Diagnosis not present

## 2014-08-13 DIAGNOSIS — J209 Acute bronchitis, unspecified: Secondary | ICD-10-CM

## 2014-08-13 DIAGNOSIS — R635 Abnormal weight gain: Secondary | ICD-10-CM

## 2014-08-13 DIAGNOSIS — E1169 Type 2 diabetes mellitus with other specified complication: Secondary | ICD-10-CM

## 2014-08-13 DIAGNOSIS — E1142 Type 2 diabetes mellitus with diabetic polyneuropathy: Secondary | ICD-10-CM

## 2014-08-13 LAB — CBC WITH DIFFERENTIAL/PLATELET
Basophils Absolute: 0.1 10*3/uL (ref 0.0–0.1)
Basophils Relative: 1 % (ref 0–1)
Eosinophils Absolute: 0.2 10*3/uL (ref 0.0–0.7)
Eosinophils Relative: 2 % (ref 0–5)
HCT: 38.4 % (ref 36.0–46.0)
Hemoglobin: 12.5 g/dL (ref 12.0–15.0)
Lymphocytes Relative: 29 % (ref 12–46)
Lymphs Abs: 2.8 10*3/uL (ref 0.7–4.0)
MCH: 27.9 pg (ref 26.0–34.0)
MCHC: 32.6 g/dL (ref 30.0–36.0)
MCV: 85.7 fL (ref 78.0–100.0)
MPV: 11.9 fL (ref 8.6–12.4)
Monocytes Absolute: 0.8 10*3/uL (ref 0.1–1.0)
Monocytes Relative: 8 % (ref 3–12)
Neutro Abs: 5.9 10*3/uL (ref 1.7–7.7)
Neutrophils Relative %: 60 % (ref 43–77)
Platelets: 309 10*3/uL (ref 150–400)
RBC: 4.48 MIL/uL (ref 3.87–5.11)
RDW: 14.4 % (ref 11.5–15.5)
WBC: 9.8 10*3/uL (ref 4.0–10.5)

## 2014-08-13 MED ORDER — HYDROCOD POLST-CHLORPHEN POLST 10-8 MG/5ML PO LQCR: 5.0000 mL | Freq: Every evening | ORAL | Status: DC | PRN

## 2014-08-13 MED ORDER — PREDNISONE (PAK) 10 MG PO TABS: ORAL_TABLET | ORAL | Status: DC

## 2014-08-13 NOTE — Assessment & Plan Note (Addendum)
Patient's self described episodes of presyncope while watching TV  appear to be nothing more than falling asleep vs narcolepsy, likely due to inadequately treated OSA or progression to  OHS .  EKG was normal today ad she has had comprehensive neurology and cardiology evaluations.

## 2014-08-13 NOTE — Progress Notes (Signed)
Pre visit review using our clinic review tool, if applicable. No additional management support is needed unless otherwise documented below in the visit note. 

## 2014-08-13 NOTE — Progress Notes (Signed)
Patient ID: Denise Sharp, female   DOB: 1946-02-21, 69 y.o.   MRN: [id number]    Patient Active Problem List   Diagnosis Date Noted  . Acute bronchitis 08/14/2014  . Obesity hypoventilation syndrome 08/13/2014  . Otitis media 08/04/2014  . Frequent falls 06/23/2014  . Chronic cough 05/05/2014  . History of adrenal adenoma 03/24/2014  . Cervicogenic headache 03/23/2014  . Syncope and collapse 03/12/2014  . Unspecified vitamin D deficiency 01/06/2014  . Abdominal pain, acute, right lower quadrant 12/03/2013  . Rectal bleeding 12/03/2013  . Weight gain 12/03/2013  . Headache(784.0) 11/23/2013  . Obesity 08/25/2013  . Abdominal discomfort in right lower quadrant 08/04/2013  . Diabetic polyneuropathy 03/25/2013  . Abnormality of gait 03/25/2013  . Multiple pulmonary nodules 03/20/2013  . SOB (shortness of breath) 03/20/2013  . Postprandial vomiting 01/20/2013  . Anemia 08/06/2012  . Iron deficiency anemia 08/06/2012  . OSA on CPAP 03/02/2012  . Esophageal stenosis   . Insomnia secondary to anxiety 02/29/2012  . Bundle branch block, left 01/07/2012  . Sciatica 09/05/2011  . Cervical stenosis of spinal canal 06/14/2011  . Adrenal mass 03/13/2011  . Irritable bowel syndrome 03/13/2011  . Restless legs syndrome 03/13/2011  . Degenerative disk disease 03/13/2011  . Hypertension 03/12/2011  . DM type 2 with diabetic peripheral neuropathy 03/12/2011  . Hearing loss, right 03/12/2011    Subjective:  CC:   Chief Complaint  Patient presents with  . pre-syncope episodes    cough, SOB. Getting "weaker and weaker" and all she wants to do it sleep. dizzy when she walks around occasionally    HPI:   Denise Sharp is a 69 y.o. female who presents as an urgent work in for reports of continued cough and severe dyspnea accompanied by recurrent episodes of presyncope which are occurring at rest. She was treated for bronchitis/otitis last week with levaqjuin and tussionex, which  she is taking at night,  And tessalon during the day for cough suppression. Still coughing all night long unless she uses the tussionex.  Can't understand why she is not losing weight given her minimal appetite. The presyncopal episodes have been occurring for several months and have been worked up by cardiology d neurology with no causative factor.  Patient  appears tired but is not short of breath at rest ,    Past Medical History  Diagnosis Date  . Diabetes mellitus   . Depression   . Hypertension   . Cervical spinal stenosis 1994    due to trauma to back ([employer] accident), has intermittent paralysis and parasthesias  . Adrenal mass   . IBS (irritable bowel syndrome)   . Obstructive sleep apnea     using CPAP  . Anemia     iron deficient post  2 unit txfsn 2009, [facility] end[facility]  . Esophageal stenosis March 2011    with transietn outlet obstruction by food, cleared by EGD   . LBBB (left bundle branch block)   . Obesity   . Apnea   . Headache(784.0)   . Arthritis   . Restless leg syndrome   . Dizziness     chronic dizziness  . Adrenal mass, left   . 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 Abnormality of gait 03/25/2013  . Syncope and collapse 03/12/2014  . Cervicogenic headache 03/23/2014    Past Surgical History  Procedure Laterality Date  . Gastric bypass    . Abdominal hysterectomy    . Gastric bypass  2000, 2005    Dr. Debroah Loop  . Appendectomy    . Gallbladder surgery  1976  . Rotator cuff repair      right  . Joint replacement  2007    bilateral knee. [name],  [name]  . Spine surgery  1995    [place]  . Interstim implant placement  April 2013    Cope  . Gallbladder surgery  resection  . Total abdominal hysterectomy w/ bilateral salpingoophorectomy  1974       The following portions of the patient's history were reviewed and updated as appropriate: Allergies, current medications, and problem list.    Review of Systems:   Patient  denies headache, fevers, malaise, unintentional weight loss, skin rash, eye pain, sinus congestion and sinus pain, sore throat, dysphagia,  hemoptysis , cough, dyspnea, wheezing, chest pain, palpitations, orthopnea, edema, abdominal pain, nausea, melena, diarrhea, constipation, flank pain, dysuria, hematuria, urinary  Frequency, nocturia, numbness, tingling, seizures,  Focal weakness, Loss of consciousness,  Tremor, insomnia, depression, anxiety, and suicidal ideation.     History   Social History  . Marital Status: Married    Spouse Name: [name]     Number of Children: 2  . Years of Education: College    Occupational History  .     Social History Main Topics  . Smoking status: Never Smoker   . Smokeless tobacco: Never Used  . Alcohol Use: No  . Drug Use: No  . Sexual Activity: Not Currently   Other Topics Concern  . Not on file   Social History Narrative   Patient lives at home with husband [name].    Patient has 2 children.    Patient has [employer].    Patient is on disability.    Patient is right handed.     Objective:  Filed Vitals:   08/13/14 1623  BP: 106/80  Pulse: 81  Temp:   Resp:      General appearance: appears tired but alert, cooperative and appears stated age. Morbidly obese, Pickwickian in stature.  Not tachypneic  Ears: normal TM's and external ear canals both ears Throat: lips, mucosa, and tongue normal; teeth and gums normal Neck: no adenopathy, no carotid bruit, supple, symmetrical, trachea midline and thyroid not enlarged, symmetric, no tenderness/mass/nodules Back: symmetric, no curvature. ROM normal. No CVA tenderness. Lungs: clear to auscultation bilaterally.  No wheezing  Heart: regular rate and rhythm, S1, S2 normal, no murmur, click, rub or gallop Abdomen: soft, non-tender; bowel sounds normal; no masses,  Large pannus Pulses: 2+ and symmetric Skin: Skin color, texture, turgor normal. No rashes or lesions Lymph nodes: Cervical,  supraclavicular, and axillary nodes normal.  Assessment and Plan: Problem List Items Addressed This Visit    Acute bronchitis    6 day prednisone taper and refill on tussionex for persistent cough in the setting of recent URI which was treated with levaquin.      DM type 2 with diabetic peripheral neuropathy    well-controlled on current medications. Patient is up-to-date on eye exams and foot exam is normal.   . Patient is tolerating statin therapy for CAD risk reduction and on ACE/ARB for reduction in proteinuria.  Lab Results  Component Value Date   HGBA1C 6.8* 08/13/2014   Lab Results  Component Value Date   MICROALBUR 6.5* 03/31/2014               RESOLVED: Fatigue   Relevant Orders   CBC w/Diff (Completed)   Comp Met (  CMET) (Completed)   Hemoglobin A1c (Completed)   TSH (Completed)   Obesity    Morbid, worsening despite reports of mininal food intake due to sedentary lifestyle.  Encouraged participation in water aerobics  Underactive thyroid noted today ; will treat      Obesity hypoventilation syndrome    Suspected by body habitus, known history of sleep apnea and history of falling asleep whenever she is not moving.  She has been mistakenly calling her epospdes fainting spells.       Syncope and collapse - Primary (Chronic)    Patient's self described episodes of presyncope while watching TV  appear to be nothing more than falling asleep vs narcolepsy, likely due to inadequately treated OSA or progression to  OHS .  EKG was normal today.  She is not orthostatic by vital sign checked lying and standing, and she has had comprehensive neurology and cardiology evaluations.        Relevant Orders   CBC w/Diff (Completed)   Comp Met (CMET) (Completed)   Hemoglobin A1c (Completed)   TSH (Completed)   Weight gain    May be partly due to underactive thyroid given her contiued weight gain, and TSH of 5.009.  Will start low dose levothyroxine.        Relevant  Medications   levothyroxine (SYNTHROID, LEVOTHROID) tablet    Other Visit Diagnoses    Dizziness and giddiness        Relevant Medications    predniSONE (STERAPRED UNI-PAK) 10 MG tablet    Other Relevant Orders    EKG 12-Lead (Completed)    CBC w/Diff (Completed)    Comp Met (CMET) (Completed)    Hemoglobin A1c (Completed)    TSH (Completed)    Diabetes mellitus type 2 in obese        Relevant Orders    CBC w/Diff (Completed)    Comp Met (CMET) (Completed)    Hemoglobin A1c (Completed)    TSH (Completed)      A total of 40 minutes was spent with patient more than half of which was spent in counseling patient on the above mentioned issues , reviewing and explaining recent labs and imaging studies done, and coordination of care.

## 2014-08-13 NOTE — Telephone Encounter (Signed)
Patient Name: Denise Sharp  DOB: 1945-11-10    Initial Comment Caller states c/o productive cough with yellow sputum, wheezing, feels like when she sits down she is going to pass out   Nurse Assessment  Nurse: [name], RN, [name] Date/Time [name] Time): 08/13/2014 1:58:33 PM  Confirm and document reason for call. If symptomatic, describe symptoms. ---Caller states that she has a productive cough with yellow sputum which she has had for 3 weeks. She was seen Monday of last week and was prescribed Levaquin. She has been wheezing, but not at present. She states that at times its bad enough to hear across the room. She is talking in many complete sentences. She has had periods of time where she felt like she passes out or goes to sleep. She states that she coughs so bad that she feels like she is losing her breath. Call was lost at this time. I called back.  Has the patient traveled out of the country within the last 30 days? ---No  Does the patient require triage? ---Yes  Related visit to physician within the last 2 weeks? ---No  Does the PT have any chronic conditions? (i.e. diabetes, asthma, etc.) ---Yes  List chronic conditions. ---Diabetes, HTN, Sleep Apnea     Guidelines    Guideline Title Affirmed Question Affirmed Notes  Cough - Acute Productive Chest pain (Exception: MILD central chest pain, present only when coughing) Moderate chest pain when coughing.   Final Disposition User   Go to ED Now [name], RN, [name]    Comments  Caller declines to go to ER as recommended. She states that the last time she was there, she had to sit for 5 hours and they didn't do anything for her. She wants an appointment to be seen. I searched for appointments for her to be seen today but was unable to find one open for the remainder of the day at this facility and the [facility] facility. She asked if I could leave a note for [name] to call her back after office hours today. Advised her that I will send the  note as requested. She verbalized understanding.

## 2014-08-13 NOTE — Telephone Encounter (Signed)
Spoke to patient per Dr. Lupita Dawn request. Patient will come in at 4pm for wok-up. Patient has been added to Dr. Lupita Dawn schedule today at 4:30pm.

## 2014-08-13 NOTE — Telephone Encounter (Signed)
PATIENT HAS A CHRONIC COUGH,  AND HER presyncope symptoms have also happened repeatedly.  If she is not willing to go to the ER, I can offer her a 4:30 app today.  PLEASE GET ORTHOSTATICS ON HER WHEN SHE ARRIVES AND AN EKG

## 2014-08-13 NOTE — Assessment & Plan Note (Signed)
Suspected by body habitus, known history of sleep apnea and history of falling asleep whenever she is not moving.  She has been mistakenly calling her epospdes fainting spells.

## 2014-08-13 NOTE — Patient Instructions (Signed)
I am prescribing a prednisone taper and refilling your Tussionex to resolve your cough  If your daytime sleepiness dose not improve,  I will consider giving you a stimulant if it does not raise your blod pressure

## 2014-08-14 ENCOUNTER — Encounter: Payer: Self-pay | Admitting: Internal Medicine

## 2014-08-14 DIAGNOSIS — J209 Acute bronchitis, unspecified: Secondary | ICD-10-CM | POA: Insufficient documentation

## 2014-08-14 LAB — COMPREHENSIVE METABOLIC PANEL
ALT: 11 U/L (ref 0–35)
AST: 15 U/L (ref 0–37)
Albumin: 3.4 g/dL — ABNORMAL LOW (ref 3.5–5.2)
Alkaline Phosphatase: 111 U/L (ref 39–117)
BUN: 13 mg/dL (ref 6–23)
CO2: 25 mEq/L (ref 19–32)
Calcium: 8.4 mg/dL (ref 8.4–10.5)
Chloride: 105 mEq/L (ref 96–112)
Creat: 0.84 mg/dL (ref 0.50–1.10)
Glucose, Bld: 103 mg/dL — ABNORMAL HIGH (ref 70–99)
Potassium: 5.4 mEq/L — ABNORMAL HIGH (ref 3.5–5.3)
Sodium: 140 mEq/L (ref 135–145)
Total Bilirubin: 0.3 mg/dL (ref 0.2–1.2)
Total Protein: 6.5 g/dL (ref 6.0–8.3)

## 2014-08-14 LAB — TSH: TSH: 5.099 u[IU]/mL — ABNORMAL HIGH (ref 0.350–4.500)

## 2014-08-14 LAB — HEMOGLOBIN A1C
Hgb A1c MFr Bld: 6.8 % — ABNORMAL HIGH (ref <5.7)
Mean Plasma Glucose: 148 mg/dL — ABNORMAL HIGH (ref <117)

## 2014-08-14 MED ORDER — LEVOTHYROXINE SODIUM 50 MCG PO TABS: 50.0000 ug | ORAL_TABLET | Freq: Every day | ORAL | Status: DC

## 2014-08-14 NOTE — Assessment & Plan Note (Signed)
6 day prednisone taper and refill on tussionex for persistent cough in the setting of recent URI which was treated with levaquin.

## 2014-08-14 NOTE — Assessment & Plan Note (Signed)
Morbid, worsening despite reports of mininal food intake due to sedentary lifestyle.  Encouraged participation in water aerobics  Underactive thyroid noted today ; will treat

## 2014-08-14 NOTE — Assessment & Plan Note (Signed)
May be partly due to underactive thyroid given her contiued weight gain, and TSH of 5.009.  Will start low dose levothyroxine.

## 2014-08-14 NOTE — Assessment & Plan Note (Signed)
well-controlled on current medications. Patient is up-to-date on eye exams and foot exam is normal.   . Patient is tolerating statin therapy for CAD risk reduction and on ACE/ARB for reduction in proteinuria.  Lab Results  Component Value Date   HGBA1C 6.8* 08/13/2014   Lab Results  Component Value Date   MICROALBUR 6.5* 03/31/2014

## 2014-08-18 ENCOUNTER — Encounter: Payer: Self-pay | Admitting: Podiatry

## 2014-08-18 ENCOUNTER — Ambulatory Visit (INDEPENDENT_AMBULATORY_CARE_PROVIDER_SITE_OTHER): Payer: Medicare Other | Admitting: Podiatry

## 2014-08-18 VITALS — BP 110/63 | HR 84 | Resp 12

## 2014-08-18 DIAGNOSIS — B351 Tinea unguium: Secondary | ICD-10-CM

## 2014-08-18 DIAGNOSIS — L97529 Non-pressure chronic ulcer of other part of left foot with unspecified severity: Secondary | ICD-10-CM | POA: Diagnosis not present

## 2014-08-18 DIAGNOSIS — E11621 Type 2 diabetes mellitus with foot ulcer: Secondary | ICD-10-CM

## 2014-08-18 DIAGNOSIS — M79673 Pain in unspecified foot: Secondary | ICD-10-CM

## 2014-08-18 NOTE — Progress Notes (Signed)
She presents today with chief complaint of painful elongated toenails.  Objective: Vital signs are stable she is alert and oriented 3. Pulses are strong and palpable bilateral nails are thick yellow dystrophic onychomycotic bilateral. No open ulcer.  Assessment: Pain in limb secondary to onychomycosis 1 through 5 bilateral.  Plan: Debridement of nails 1 through 5 bilateral.

## 2014-08-23 ENCOUNTER — Telehealth: Payer: Self-pay

## 2014-08-23 NOTE — Telephone Encounter (Signed)
The patient called and stated she missed a call on Thursday - she is hoping a return call on her cell.  She stated someone mentioned the call was "urgent" on Thursday.  I dont see any documentation in her chart, but she is hoping for a call back as soon as possible.

## 2014-08-23 NOTE — Telephone Encounter (Signed)
Nothing in patient chart as to reason for a call advised patient may have been an automated call.

## 2014-09-09 NOTE — Telephone Encounter (Signed)
Called the patient and rescheduled the appointment

## 2014-09-26 DIAGNOSIS — T07 Unspecified multiple injuries: Secondary | ICD-10-CM | POA: Diagnosis not present

## 2014-09-28 ENCOUNTER — Ambulatory Visit: Payer: Medicare Other | Admitting: Adult Health

## 2014-10-06 ENCOUNTER — Encounter: Payer: Self-pay | Admitting: Adult Health

## 2014-10-06 ENCOUNTER — Ambulatory Visit (INDEPENDENT_AMBULATORY_CARE_PROVIDER_SITE_OTHER): Payer: Medicare Other | Admitting: Adult Health

## 2014-10-06 VITALS — BP 140/76 | HR 63 | Ht 64.0 in | Wt 243.0 lb

## 2014-10-06 DIAGNOSIS — G4486 Cervicogenic headache: Secondary | ICD-10-CM

## 2014-10-06 DIAGNOSIS — R413 Other amnesia: Secondary | ICD-10-CM

## 2014-10-06 DIAGNOSIS — R55 Syncope and collapse: Secondary | ICD-10-CM | POA: Diagnosis not present

## 2014-10-06 DIAGNOSIS — R51 Headache: Secondary | ICD-10-CM | POA: Diagnosis not present

## 2014-10-06 NOTE — Progress Notes (Signed)
PATIENT: STEELE Sharp DOB: June 01, 1946  REASON FOR VISIT: follow up- cervicogenic headaches, syncopal episodes HISTORY FROM: patient  HISTORY OF PRESENT ILLNESS: Denise Sharp is a 69 year old female with a history of cervicogenic headaches and a history of syncopal events. The patient returns today for follow-up. The patient has been taking Lyrica 50 mg twice a day however she's not been doing this consistently. She does report that her headache frequency has improved as well as the severity. She states that when she does get a headache it is normally located in the occipital region and her scalp will be sore. She will occasionally have a headache in the bitemporal region. She did go to neuromuscular therapy but did not notice much benefit. She states that she's been having dizziness daily. She describes it as her head is spinning but the room is still. Her primary care recently started her on medication for hypothyroidism. She also states that her blood sugars have been uncontrolled. She also reports significant weight gain. She states that the weight gain is what prompted her PCP to check her thyroid. The patient reports that she was admitted to our [facility] for syncopal episodes. She states that she had 4 episodes in 1 day- two of these episodes was witnessed by EMS according to the patient. She states that she was in the hospital for 3 days but they could not find a cause of her syncopal events. She states that she had a CT of the head that was unremarkable as well as an EEG that was unremarkable. She does state that her blood pressure was elevated while she was sitting- once EMS had her stand her blood pressure decreased. She also had an echocardiogram that was normal per the patient. I have not seen the notes from [facility]. The patient states that since then she's had 1-2 episodes of syncope. She states these consist of her sitting in her chair and what she  describes as losing spans of time. For example she can be sitting crocheting watching TV and the next thing she knows an hour and a half would have past and she has no recollection of that. She is unsure if she is falling asleep? Her husband has witnessed her there with her eyes closed but has not tried to arouse her. The patient is on multiple medications. She also takes oxycodone as well as Ultram for generalized pain. Although she states that she does not take this medication often. Patient states that after her hospitalization she did have 6 weeks of physical therapy. She uses a cane when ambulating. She also reports some issues with her memory. She states her daughter has noticed that she repeats things often. Patient states she notices this as well. She also states that when driving to certain locations that is very common she really has to focus in order to get there. She denies actually getting lost. Her daughter works on a dementia unit and is concerned about her mother's memory. She has been following with her primary care provider. She returns today for follow-up.  HISTORY: Denise Sharp is a 69 year old female with a history of cervicogenic headaches and a history of syncopal events. The patient returns today for follow-up. She is currently taking Lyrica 50 mg twice a day. She reports that her headaches have Improved some. She reports that her biggest concern is her ongoing balance issues and recent falls. The patient has had 4-5 falls since the last visit. She tripped over a  cord causing her to fall. Most of her falls are due to her tripping over her own feet. No significant injuries from the falls. She uses a cane to ambulate but does not use it all the time. She states that she was not using the cane when she has fallen in the past. She has scooter that she will use for long distances.. She has been to physical therapy in the past. She recently finished about 6 weeks ago. Patient wants to have a  MRI of the brain. She has been unable to have this in the past due to a stent in her bladder. However she states that Dr. Jacqlyn Larsen at [facility] told her that they will do the MRI there. The patient has had ongoing neck discomfort. She has been seen by Dr. Joya Salm who has stated that she is not a candidate for surgery. She also has peripheral neuropathy due to diabetes.   HISTORY 03/23/14 (Sharp): 69 year old right-handed white female with a history of cervicogenic headache and a history of syncopal events. The patient underwent an EEG study recently that was completely normal. She has not had any blackout events since last seen. She returns for an evaluation. She indicates that she is on Lyrica, but she does not take this on a scheduled basis. She walks with a cane, and she has not had any further falls. The headaches are better in the morning, worse as the day goes on, mainly in the occipital area of the head and top of the head. The headache usually is better if she can take a nap  REVIEW OF SYSTEMS: Out of a complete 14 system review of symptoms, the patient complains only of the following symptoms, and all other reviewed systems are negative.  Appetite change, fatigue, unexpected weight change, eye discharge, shortness of breath, leg swelling, swollen abdomen, diarrhea, incontinence of bowels, restless leg, insomnia, apnea, joint pain, joint swelling, back pain, aching muscles, muscle cramps,, walking difficulty, neck pain, neck stiffness, incontinence of bladder, dizziness, headache  ALLERGIES: Allergies  Allergen Reactions  . Diovan [Valsartan]   . Erythromycin   . Flagyl [Metronidazole]   . Glucophage [Metformin Hcl]   . Xanax [Alprazolam]   . Amoxicillin Diarrhea  . Demeclocycline Hives  . Erythromycin Base   . Metformin   . Tetracyclines & Related Hives and Rash    HOME MEDICATIONS: Outpatient Prescriptions Prior to Visit  Medication Sig Dispense Refill  . albuterol (PROVENTIL HFA;VENTOLIN  HFA) 108 (90 BASE) MCG/ACT inhaler Inhale 2 puffs into the lungs every 6 (six) hours as needed for wheezing or shortness of breath. 2 Inhaler 3  . benzonatate (TESSALON) 200 MG capsule Take 1 capsule (200 mg total) by mouth 3 (three) times daily as needed for cough. 180 capsule 1  . chlorpheniramine-HYDROcodone (TUSSIONEX PENNKINETIC ER) 10-8 MG/5ML LQCR Take 5 mLs by mouth at bedtime as needed for cough. 200 mL 0  . Cholecalciferol (VITAMIN D3) 10000 UNITS capsule Take 10,000 Units by mouth once a week.    . cyanocobalamin (,VITAMIN B-12,) 1000 MCG/ML injection 1000 ml (1 ml) injection twice monthly 25 mL 3  . diazepam (VALIUM) 10 MG tablet Take 1 tablet (10 mg total) by mouth at bedtime as needed for anxiety. 90 tablet 1  . estradiol (ESTRACE) 0.5 MG tablet Take 1 tablet (0.5 mg total) by mouth daily. 90 tablet 2  . eszopiclone (LUNESTA) 2 MG TABS tablet Take 1 tablet (2 mg total) by mouth at bedtime. Take immediately before bedtime 90 tablet  1  . fluticasone (FLONASE) 50 MCG/ACT nasal spray Place 2 sprays into both nostrils daily. 16 g 6  . Glucose Blood (BAYER BREEZE 2 TEST) DISK Use one test strip each time glucose needs testing. 50 each 2  . hydrALAZINE (APRESOLINE) 25 MG tablet Take 1 tablet (25 mg total) by mouth 3 (three) times daily. As needed for bp > 160 270 tablet 1  . hydrocodone-acetaminophen (LORCET-HD) 5-500 MG per capsule Take 1 capsule by mouth every 6 (six) hours as needed. 30 capsule 3  . levothyroxine (SYNTHROID, LEVOTHROID) 50 MCG tablet Take 1 tablet (50 mcg total) by mouth daily. 90 tablet 3  . losartan (COZAAR) 50 MG tablet     . nebivolol (BYSTOLIC) 5 MG tablet Take 1 tablet (5 mg total) by mouth daily. 90 tablet 1  . nystatin (MYCOSTATIN) powder Apply topically 2 (two) times daily. 60 g 3  . oxyCODONE-acetaminophen (PERCOCET/ROXICET) 5-325 MG per tablet Take 1 tablet by mouth every 6 (six) hours as needed. 30 tablet 0  . pregabalin (LYRICA) 50 MG capsule Take 1 tablet in  the morning and 2 tablets in the evening. 90 capsule 2  . rOPINIRole (REQUIP) 1 MG tablet Take 1 tablet (1 mg total) by mouth at bedtime. 90 tablet 2  . sitaGLIPtin (JANUVIA) 100 MG tablet Take 1 tablet (100 mg total) by mouth daily. 90 tablet 2  . traMADol (ULTRAM) 50 MG tablet Take 50 mg by mouth 4 (four) times daily as needed.    . furosemide (LASIX) 20 MG tablet Take 1 tablet (20 mg total) by mouth daily. (Patient not taking: Reported on 10/06/2014) 90 tablet 3  . pantoprazole (PROTONIX) 40 MG tablet Take 1 tablet (40 mg total) by mouth daily. (Patient not taking: Reported on 10/06/2014) 90 tablet 1  . Eluxadoline 100 MG TABS Take 1 tablet by mouth 2 (two) times daily.     . predniSONE (STERAPRED UNI-PAK) 10 MG tablet 6 tablets on Day 1 , then reduce by 1 tablet daily until gone 21 tablet 0   No facility-administered medications prior to visit.    PAST MEDICAL HISTORY: Past Medical History  Diagnosis Date  . Diabetes mellitus   . Depression   . Hypertension   . Cervical spinal stenosis 1994    due to trauma to back ([employer] accident), has intermittent paralysis and parasthesias  . Adrenal mass   . IBS (irritable bowel syndrome)   . Obstructive sleep apnea     using CPAP  . Anemia     iron deficient post  2 unit txfsn 2009, normal endo/colonoscopy by [facility]  . Esophageal stenosis March 2011    with transietn outlet obstruction by food, cleared by EGD   . LBBB (left bundle branch block)   . Obesity   . Apnea   . Headache(784.0)   . Arthritis   . Restless leg syndrome   . Dizziness     chronic dizziness  . Adrenal mass, left   . 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 Abnormality of gait 03/25/2013  . Syncope and collapse 03/12/2014  . Cervicogenic headache 03/23/2014    PAST SURGICAL HISTORY: Past Surgical History  Procedure Laterality Date  . Gastric bypass    . Abdominal hysterectomy    . Gastric bypass  2000, 2005    Dr. Debroah Loop  . Appendectomy    . Gallbladder  surgery  1976  . Rotator cuff repair      right  . Joint replacement  2007  bilateral knee. [name],  [name]  . Spine surgery  1995    [place]  . Interstim implant placement  April 2013    [place]  . Gallbladder surgery  resection  . Total abdominal hysterectomy w/ bilateral salpingoophorectomy  1974    FAMILY HISTORY: Family History  Problem Relation Age of Onset  . Heart disease Father   . Hypertension Father   . Prostate cancer Father   . Stroke Father   . Osteoporosis Father   . Stroke Mother   . Depression Mother   . Headache Mother   . Heart disease Mother   . Thyroid disease Mother   . Hypertension Mother   . Diabetes Daughter   . Heart disease Daughter   . Hypertension Daughter   . Hypertension Son     SOCIAL HISTORY: History   Social History  . Marital Status: Married    Spouse Name: [name]   . Number of Children: 2  . Years of Education: College    Occupational History  .     Social History Main Topics  . Smoking status: Never Smoker   . Smokeless tobacco: Never Used  . Alcohol Use: No  . Drug Use: No  . Sexual Activity: Not Currently   Other Topics Concern  . Not on file   Social History Narrative   Patient lives at home with husband [name].    Patient has 2 children.    Patient has [employer].    Patient is on disability.    Patient is right handed.       PHYSICAL EXAM  Filed Vitals:   10/06/14 1047  BP: 140/76  Pulse: 63  Height: 5\' 4"  (1.626 m)  Weight: 243 lb (110.224 kg)   Body mass index is 41.69 kg/(m^2).  Generalized: Well developed, in no acute distress   Neurological examination  Mentation: Alert oriented to time, place, history taking. Follows all commands speech and language fluent. MMSE 30/30 Cranial nerve II-XII: Pupils were equal round reactive to light. Extraocular movements were full, visual field were full on confrontational test. Facial sensation and strength were normal. Uvula tongue midline. Head turning  and shoulder shrug  were normal and symmetric. Motor: The motor testing reveals 5 over 5 strength of all 4 extremities. Good symmetric motor tone is noted throughout.  Sensory: Sensory testing is intact to soft touch on all 4 extremities. No evidence of extinction is noted.  Coordination: Cerebellar testing reveals good finger-nose-finger and heel-to-shin bilaterally.  Gait and station: Gait is normal. Tandem gait is unsteady. Romberg is negative. No drift is seen.  Reflexes: Deep tendon reflexes are symmetric and normal bilaterally.     DIAGNOSTIC DATA (LABS, IMAGING, TESTING) - I reviewed patient records, labs, notes, testing and imaging myself where available.  Lab Results  Component Value Date   WBC 9.8 08/13/2014   HGB 12.5 08/13/2014   HCT 38.4 08/13/2014   MCV 85.7 08/13/2014   PLT 309 08/13/2014      Component Value Date/Time   NA 140 08/13/2014 1710   K 5.4* 08/13/2014 1710   CL 105 08/13/2014 1710   CO2 25 08/13/2014 1710   GLUCOSE 103* 08/13/2014 1710   BUN 13 08/13/2014 1710   CREATININE 0.84 08/13/2014 1710   CREATININE 1.0 03/31/2014 1050   CALCIUM 8.4 08/13/2014 1710   PROT 6.5 08/13/2014 1710   ALBUMIN 3.4* 08/13/2014 1710   AST 15 08/13/2014 1710   ALT 11 08/13/2014 1710   ALKPHOS 111 08/13/2014  1710   BILITOT 0.3 08/13/2014 1710   GFRNONAA 70 12/13/2011 0919   GFRAA 80 12/13/2011 0919   Lab Results  Component Value Date   CHOL 205* 03/31/2014   HDL 48.60 03/31/2014   LDLCALC 125* 03/31/2014   TRIG 158.0* 03/31/2014   CHOLHDL 4 03/31/2014   Lab Results  Component Value Date   HGBA1C 6.8* 08/13/2014   Lab Results  Component Value Date   VITAMINB12 549 03/31/2014   Lab Results  Component Value Date   TSH 5.099* 08/13/2014      ASSESSMENT AND PLAN 69 y.o. year old female  has a past medical history of Diabetes mellitus; Depression; Hypertension; Cervical spinal stenosis (1994); Adrenal mass; IBS (irritable bowel syndrome); Obstructive sleep  apnea; Anemia; Esophageal stenosis (March 2011); LBBB (left bundle branch block); Obesity; Apnea; Headache(784.0); Arthritis; Restless leg syndrome; Dizziness; Adrenal mass, left; Polyneuropathy in diabetes(357.2) (03/25/2013); Abnormality of gait (03/25/2013); Syncope and collapse (03/12/2014); and Cervicogenic headache (03/23/2014). here with:  1: Cervicogenic headaches 2. Syncopal episodes 3. Memory disturbance  The patient's cervicogenic headaches have improved. I have encouraged the patient try to use the Lyrica consistently. She verbalized understanding. The patient has had additional syncopal episodes however in the past we have done a cerebrovascular workup that was unremarkable. She has had a workup in the hospital however I have not reviewed these notes. According to the patient everything was unremarkable. Her primary care is aware of her symptoms. She has recently been started on medication for hypothyroidism. Her blood sugars have also been uncontrolled. This could explain her dizziness. I have advised the patient that I will request the notes from [facility] to review. Not sure that her syncopal episodes are from a neurological origin. The patient's memory score today is stable. We will continue to monitor over time. If she has additional syncopal episode she should let her primary care as well as our office know. If her symptoms worsen or she develops new symptoms she should let us know otherwise she will follow-up in 3-4 months with Dr. Jannifer Franklin.   Ward Givens, MSN, NP-C 10/06/2014, 10:56 AM [facility] [address] [place], [place] [address] [phone]  Note: This document was prepared with digital dictation and possible smart phrase technology. Any transcriptional errors that result from this process are unintentional.

## 2014-10-06 NOTE — Patient Instructions (Signed)
Try to take Lyrica daily. If headaches or neck pain worsen please let us know.  I will request notes from hospital visit.  Memory is stable. Follow-up with PCP.

## 2014-10-06 NOTE — Progress Notes (Signed)
I have read the note, and I agree with the clinical assessment and plan.  Denise Sharp KEITH   

## 2014-10-07 ENCOUNTER — Telehealth: Payer: Self-pay | Admitting: *Deleted

## 2014-10-07 ENCOUNTER — Telehealth: Payer: Self-pay | Admitting: Neurology

## 2014-10-07 NOTE — Telephone Encounter (Signed)
The patient was admitted to [facility] on 02/26/2014, discharge the next day. She came in with headache and dizziness with several episodes of syncope prior to admission. CT the head was normal, 2-D echocardiogram was unremarkable. She was treated with IV magnesium sulfate and evaluated by cardiology. Telemetry revealed no arrhythmias. Ejection fraction was 60-65%. Normal left ventricular function was noted. 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 Carotid Doppler showed minimal plaque in the right bifurcations bilaterally, otherwise unremarkable.

## 2014-10-07 NOTE — Telephone Encounter (Signed)
Records received [facility] Dr Jannifer Franklin requested 10-07-14.

## 2014-10-13 DIAGNOSIS — R350 Frequency of micturition: Secondary | ICD-10-CM | POA: Diagnosis not present

## 2014-10-13 DIAGNOSIS — N3941 Urge incontinence: Secondary | ICD-10-CM | POA: Diagnosis not present

## 2014-10-13 DIAGNOSIS — N3000 Acute cystitis without hematuria: Secondary | ICD-10-CM | POA: Insufficient documentation

## 2014-10-15 DIAGNOSIS — Z681 Body mass index (BMI) 19 or less, adult: Secondary | ICD-10-CM | POA: Diagnosis not present

## 2014-10-15 DIAGNOSIS — J9811 Atelectasis: Secondary | ICD-10-CM | POA: Diagnosis not present

## 2014-10-15 DIAGNOSIS — J984 Other disorders of lung: Secondary | ICD-10-CM | POA: Diagnosis not present

## 2014-10-15 DIAGNOSIS — G4733 Obstructive sleep apnea (adult) (pediatric): Secondary | ICD-10-CM | POA: Diagnosis not present

## 2014-10-15 DIAGNOSIS — Z6837 Body mass index (BMI) 37.0-37.9, adult: Secondary | ICD-10-CM | POA: Diagnosis not present

## 2014-10-15 DIAGNOSIS — Z01811 Encounter for preprocedural respiratory examination: Secondary | ICD-10-CM | POA: Diagnosis not present

## 2014-10-15 DIAGNOSIS — T85192D Other mechanical complication of implanted electronic neurostimulator (electrode) of spinal cord, subsequent encounter: Secondary | ICD-10-CM | POA: Diagnosis not present

## 2014-10-15 DIAGNOSIS — I10 Essential (primary) hypertension: Secondary | ICD-10-CM | POA: Diagnosis not present

## 2014-10-15 DIAGNOSIS — Z01818 Encounter for other preprocedural examination: Secondary | ICD-10-CM | POA: Diagnosis not present

## 2014-10-15 DIAGNOSIS — T85192A Other mechanical complication of implanted electronic neurostimulator (electrode) of spinal cord, initial encounter: Secondary | ICD-10-CM | POA: Diagnosis not present

## 2014-10-15 DIAGNOSIS — E119 Type 2 diabetes mellitus without complications: Secondary | ICD-10-CM | POA: Diagnosis not present

## 2014-10-15 DIAGNOSIS — M4802 Spinal stenosis, cervical region: Secondary | ICD-10-CM | POA: Diagnosis not present

## 2014-10-15 DIAGNOSIS — N3941 Urge incontinence: Secondary | ICD-10-CM | POA: Diagnosis not present

## 2014-10-15 DIAGNOSIS — R350 Frequency of micturition: Secondary | ICD-10-CM | POA: Diagnosis not present

## 2014-10-15 DIAGNOSIS — E1142 Type 2 diabetes mellitus with diabetic polyneuropathy: Secondary | ICD-10-CM | POA: Diagnosis not present

## 2014-10-15 DIAGNOSIS — K219 Gastro-esophageal reflux disease without esophagitis: Secondary | ICD-10-CM | POA: Diagnosis not present

## 2014-10-15 DIAGNOSIS — T85111A Breakdown (mechanical) of implanted electronic neurostimulator (electrode) of peripheral nerve, initial encounter: Secondary | ICD-10-CM | POA: Diagnosis not present

## 2014-10-16 DIAGNOSIS — T85111A Breakdown (mechanical) of implanted electronic neurostimulator (electrode) of peripheral nerve, initial encounter: Secondary | ICD-10-CM | POA: Insufficient documentation

## 2014-10-20 LAB — CBC AND DIFFERENTIAL
Hemoglobin: 11.7 g/dL — AB (ref 12.0–16.0)
Platelets: 302 10*3/uL (ref 150–399)
WBC: 9.2 10^3/mL

## 2014-10-20 LAB — BASIC METABOLIC PANEL
BUN: 11 mg/dL (ref 4–21)
Creatinine: 0.9 mg/dL (ref 0.5–1.1)
Glucose: 210 mg/dL
Potassium: 3.8 mmol/L (ref 3.4–5.3)
Sodium: 143 mmol/L (ref 137–147)

## 2014-10-25 DIAGNOSIS — N281 Cyst of kidney, acquired: Secondary | ICD-10-CM | POA: Diagnosis not present

## 2014-10-25 DIAGNOSIS — N3941 Urge incontinence: Secondary | ICD-10-CM | POA: Diagnosis not present

## 2014-10-25 DIAGNOSIS — R350 Frequency of micturition: Secondary | ICD-10-CM | POA: Diagnosis not present

## 2014-10-25 DIAGNOSIS — Z4542 Encounter for adjustment and management of neuropacemaker (brain) (peripheral nerve) (spinal cord): Secondary | ICD-10-CM | POA: Diagnosis not present

## 2014-10-25 DIAGNOSIS — T8589XA Other specified complication of internal prosthetic devices, implants and grafts, not elsewhere classified, initial encounter: Secondary | ICD-10-CM | POA: Diagnosis not present

## 2014-10-25 DIAGNOSIS — Z96653 Presence of artificial knee joint, bilateral: Secondary | ICD-10-CM | POA: Diagnosis not present

## 2014-10-25 DIAGNOSIS — Z880 Allergy status to penicillin: Secondary | ICD-10-CM | POA: Diagnosis not present

## 2014-10-25 DIAGNOSIS — K429 Umbilical hernia without obstruction or gangrene: Secondary | ICD-10-CM | POA: Diagnosis not present

## 2014-10-25 DIAGNOSIS — G8929 Other chronic pain: Secondary | ICD-10-CM | POA: Diagnosis not present

## 2014-10-25 DIAGNOSIS — Z9884 Bariatric surgery status: Secondary | ICD-10-CM | POA: Diagnosis not present

## 2014-10-25 DIAGNOSIS — K589 Irritable bowel syndrome without diarrhea: Secondary | ICD-10-CM | POA: Diagnosis not present

## 2014-10-25 DIAGNOSIS — D509 Iron deficiency anemia, unspecified: Secondary | ICD-10-CM | POA: Diagnosis not present

## 2014-10-25 DIAGNOSIS — N3090 Cystitis, unspecified without hematuria: Secondary | ICD-10-CM | POA: Diagnosis not present

## 2014-10-25 DIAGNOSIS — E669 Obesity, unspecified: Secondary | ICD-10-CM | POA: Diagnosis not present

## 2014-10-25 DIAGNOSIS — Z9981 Dependence on supplemental oxygen: Secondary | ICD-10-CM | POA: Diagnosis not present

## 2014-10-25 DIAGNOSIS — G4700 Insomnia, unspecified: Secondary | ICD-10-CM | POA: Diagnosis not present

## 2014-10-25 DIAGNOSIS — F419 Anxiety disorder, unspecified: Secondary | ICD-10-CM | POA: Diagnosis not present

## 2014-10-25 DIAGNOSIS — R1310 Dysphagia, unspecified: Secondary | ICD-10-CM | POA: Diagnosis not present

## 2014-10-25 DIAGNOSIS — I10 Essential (primary) hypertension: Secondary | ICD-10-CM | POA: Diagnosis not present

## 2014-10-25 DIAGNOSIS — E119 Type 2 diabetes mellitus without complications: Secondary | ICD-10-CM | POA: Diagnosis not present

## 2014-10-25 DIAGNOSIS — D4412 Neoplasm of uncertain behavior of left adrenal gland: Secondary | ICD-10-CM | POA: Diagnosis not present

## 2014-10-25 DIAGNOSIS — R110 Nausea: Secondary | ICD-10-CM | POA: Diagnosis not present

## 2014-10-25 DIAGNOSIS — R3914 Feeling of incomplete bladder emptying: Secondary | ICD-10-CM | POA: Diagnosis not present

## 2014-10-25 DIAGNOSIS — R51 Headache: Secondary | ICD-10-CM | POA: Diagnosis not present

## 2014-10-25 DIAGNOSIS — R159 Full incontinence of feces: Secondary | ICD-10-CM | POA: Diagnosis not present

## 2014-10-25 DIAGNOSIS — T85192A Other mechanical complication of implanted electronic neurostimulator (electrode) of spinal cord, initial encounter: Secondary | ICD-10-CM | POA: Diagnosis not present

## 2014-10-27 DIAGNOSIS — R350 Frequency of micturition: Secondary | ICD-10-CM | POA: Diagnosis not present

## 2014-10-28 ENCOUNTER — Encounter: Payer: Self-pay | Admitting: Endocrinology

## 2014-10-28 ENCOUNTER — Ambulatory Visit (INDEPENDENT_AMBULATORY_CARE_PROVIDER_SITE_OTHER): Payer: Medicare Other | Admitting: Endocrinology

## 2014-10-28 VITALS — BP 122/70 | HR 73 | Resp 14 | Ht 64.0 in | Wt 241.2 lb

## 2014-10-28 DIAGNOSIS — E279 Disorder of adrenal gland, unspecified: Secondary | ICD-10-CM

## 2014-10-28 DIAGNOSIS — E278 Other specified disorders of adrenal gland: Secondary | ICD-10-CM

## 2014-10-28 NOTE — Patient Instructions (Signed)
Adrenal mass-  Collect 24 hour urine for completion of adrenal mass evaluation.  ( dont collect first void of day 1, collect all samples through day 1 and night 1 and first sample of day 2).   Bring back to the lab. This test can be done after your vacation, when we are confident that the interstim settings are correct.  Will order CT adrenal gland following this.   Please come back for a follow-up appointment in 6 months

## 2014-10-28 NOTE — Progress Notes (Signed)
HPI  Denise Sharp is a 69 y.o.-year-old female, for evaluation and management  an adrenal incidentaloma. Last visit October 2015.  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 Right adrenal gland is normal in size without evidence of adenoma.    I reviewed her chart and she also has a history of hypertension, obseity s/p prior gastric bypass surgeries, well controlled diabetes with polyneuropathy and episodic abdominal pain.  Reviewed Urology notes from [facility] as well.  The patient was being followed by Dr Jacqlyn Larsen for the adrenal adenoma and 24 hour urine studies for cortisol, catecholamines, metanephrines, VMA have been normal in September 2015. There is no corresponding 24 hour urine creatinine on the results, but 24 hour urine volume looks good at 1100. The patient has prior "abnormal bloodwork for her adrenal gland" based on Urology report, however I was not able to review these results on the EMR. There is a mention that this could have been related to prior use of Prednisone. She did have a normal renin level in September 2013 and a normal morning cortisol level at that time. 24 hour urine studies for fractionated catecholamines were also normal in August 2013 when done by her cardiologist, Dr Audelia Acton.   The patient reported at the last visit that the adrenal tumor has been causing problems with her labile HTN, and has been mimicking MI on occasions. She has had prior normal cardiac evaluations. She reported that her BP can fluctuate between 180/115 to 90/50 in a few hours inspite of any provoking factor. She has problems with HTN since 2012, now is being treated with 2 agents. She also takes HCTZ if BP >160. Today in office, BP is well controlled. Recently, her BP has been fine.  - Denies  recent use of steroids or epidurals. Has been off the estrace tablet since Sept 2015. Denies abnormal stretch marks, easy bruising. She has concerns about weight gain after her last gastric bypass surgery. Wonders if it is her thyroid that is causing this.  -Denies excess hair growth. - She reports that she has headaches all the time, with dizzy spells and palpitations that are constant, irrespective of BP fluctuations.  -She has multiple GI motility complaints that really affect her quality of life together with the BP fluctuations.  Reports that normally sleeps for about 3-4 hours at night time.  -Recent bladder interstim surgery, and they are still getting the setting right, but reports that her sugars are going up ( taking Januvia daily and Glip only if sugars are elevated). Also, dealing with leg swelling, pain , stress, insomnia. Requesting that her DM medications be adjusted.   -prior adrenal workup done October 2015-  Was normal for DHEAS, renin/aldo, 1mg  dex suppression, plasma metanephrines  I have reviewed the patient's past medical history, family and social history, surgical history, medications and allergies.    Current Outpatient Prescriptions on File Prior to Visit  Medication Sig Dispense Refill  . albuterol (PROVENTIL HFA;VENTOLIN HFA) 108 (90 BASE) MCG/ACT inhaler Inhale 2 puffs into the lungs every 6 (six) hours as needed for wheezing or shortness of breath. 2 Inhaler 3  . benzonatate (TESSALON) 200 MG capsule Take 1 capsule (200 mg total) by mouth 3 (three) times daily as needed for cough. 180 capsule  1  . chlorpheniramine-HYDROcodone (TUSSIONEX PENNKINETIC ER) 10-8 MG/5ML LQCR Take 5 mLs by mouth at bedtime as needed for cough. 200 mL 0  . Cholecalciferol (VITAMIN D3) 10000 UNITS capsule Take 10,000 Units by mouth once a week.    . cyanocobalamin (,VITAMIN B-12,) 1000 MCG/ML injection 1000 ml (1 ml) injection twice monthly 25 mL 3  . diazepam (VALIUM) 10 MG tablet Take 1  tablet (10 mg total) by mouth at bedtime as needed for anxiety. 90 tablet 1  . estradiol (ESTRACE) 0.5 MG tablet Take 1 tablet (0.5 mg total) by mouth daily. 90 tablet 2  . eszopiclone (LUNESTA) 2 MG TABS tablet Take 1 tablet (2 mg total) by mouth at bedtime. Take immediately before bedtime 90 tablet 1  . fluticasone (FLONASE) 50 MCG/ACT nasal spray Place 2 sprays into both nostrils daily. 16 g 6  . furosemide (LASIX) 20 MG tablet Take 1 tablet (20 mg total) by mouth daily. 90 tablet 3  . Glucose Blood (BAYER BREEZE 2 TEST) DISK Use one test strip each time glucose needs testing. 50 each 2  . hydrALAZINE (APRESOLINE) 25 MG tablet Take 1 tablet (25 mg total) by mouth 3 (three) times daily. As needed for bp > 160 270 tablet 1  . hydrocodone-acetaminophen (LORCET-HD) 5-500 MG per capsule Take 1 capsule by mouth every 6 (six) hours as needed. 30 capsule 3  . levothyroxine (SYNTHROID, LEVOTHROID) 50 MCG tablet Take 1 tablet (50 mcg total) by mouth daily. 90 tablet 3  . losartan (COZAAR) 50 MG tablet     . nebivolol (BYSTOLIC) 5 MG tablet Take 1 tablet (5 mg total) by mouth daily. 90 tablet 1  . nystatin (MYCOSTATIN) powder Apply topically 2 (two) times daily. 60 g 3  . oxyCODONE-acetaminophen (PERCOCET/ROXICET) 5-325 MG per tablet Take 1 tablet by mouth every 6 (six) hours as needed. 30 tablet 0  . pregabalin (LYRICA) 50 MG capsule Take 1 tablet in the morning and 2 tablets in the evening. 90 capsule 2  . rOPINIRole (REQUIP) 1 MG tablet Take 1 tablet (1 mg total) by mouth at bedtime. 90 tablet 2  . sitaGLIPtin (JANUVIA) 100 MG tablet Take 1 tablet (100 mg total) by mouth daily. 90 tablet 2  . traMADol (ULTRAM) 50 MG tablet Take 50 mg by mouth 4 (four) times daily as needed.     No current facility-administered medications on file prior to visit.   Allergies  Allergen Reactions  . Diovan [Valsartan]   . Erythromycin   . Flagyl [Metronidazole]   . Glucophage [Metformin Hcl]   . Xanax [Alprazolam]    . Amoxicillin Diarrhea  . Demeclocycline Hives  . Erythromycin Base   . Metformin   . Tetracyclines & Related Hives and Rash      PE: BP 122/70 mmHg  Pulse 73  Resp 14  Ht 5\' 4"  (1.626 m)  Wt 241 lb 4 oz (109.43 kg)  BMI 41.39 kg/m2  SpO2 98% Wt Readings from Last 3 Encounters:  10/29/14 241 lb 12 oz (109.657 kg)  10/28/14 241 lb 4 oz (109.43 kg)  10/06/14 243 lb (110.224 kg)   Constitutional: overweight, in NAD Eyes: PERRLA, EOMI, no exophthalmos, no lid lag, no stare ENT: moist mucous membranes, no thyromegaly, no cervical lymphadenopathy Cardiovascular: RRR, No MRG Respiratory: CTA B Gastrointestinal: abdomen soft, obese, NT, ND, BS+, no abnormal stretch marks Musculoskeletal: no deformities, strength grosslyintact in all 4 Skin: moist, warm, 1+ edema Neurological: no tremor with outstretched hands, DTR normal in  all 4  ASSESSMENT: 1. Left adrenal adenoma  PLAN:  Problem List Items Addressed This Visit      Other   Adrenal mass - Primary    It is reassuring that the CT scans have demonstrated a size stability in the past 5 years. At the next imaging interval, would however repeat CT abdomen with adrenal protocol for further determination of imaging characteristics. Would recommend repeat CT in May 2016 .  Based on prior workup, it appears that adrenal adenoma is probably nonfunctional. Will round out this year's workup by  Ordering 24 hour urine tests.   Her BP in clinic today is well controlled. She has multiple other issues regarding thyroid and DM that we did not address today- I have however made an appointment for her to visit her PCP tomorrow-since PCP managing this- will send Dr [name] a note.           Relevant Orders   Creatinine, urine, 24 hour   Cortisol, urine, 24 hour   Metanephrines, urine, 24 hour   Catecholamines, fractionated, urine, 24 hour     RTC 6 months  Elijan Googe [facility] 10/29/2014 3:20 PM

## 2014-10-28 NOTE — Progress Notes (Signed)
Pre visit review using our clinic review tool, if applicable. No additional management support is needed unless otherwise documented below in the visit note. 

## 2014-10-29 ENCOUNTER — Encounter: Payer: Self-pay | Admitting: Internal Medicine

## 2014-10-29 ENCOUNTER — Ambulatory Visit (INDEPENDENT_AMBULATORY_CARE_PROVIDER_SITE_OTHER): Payer: Medicare Other | Admitting: Internal Medicine

## 2014-10-29 VITALS — BP 116/74 | HR 66 | Temp 98.7°F | Resp 12 | Ht 64.0 in | Wt 241.8 lb

## 2014-10-29 DIAGNOSIS — E038 Other specified hypothyroidism: Secondary | ICD-10-CM | POA: Diagnosis not present

## 2014-10-29 DIAGNOSIS — Z9989 Dependence on other enabling machines and devices: Secondary | ICD-10-CM

## 2014-10-29 DIAGNOSIS — E0789 Other specified disorders of thyroid: Secondary | ICD-10-CM | POA: Diagnosis not present

## 2014-10-29 DIAGNOSIS — E1142 Type 2 diabetes mellitus with diabetic polyneuropathy: Secondary | ICD-10-CM

## 2014-10-29 DIAGNOSIS — E034 Atrophy of thyroid (acquired): Secondary | ICD-10-CM | POA: Diagnosis not present

## 2014-10-29 DIAGNOSIS — B3789 Other sites of candidiasis: Secondary | ICD-10-CM

## 2014-10-29 DIAGNOSIS — G629 Polyneuropathy, unspecified: Secondary | ICD-10-CM

## 2014-10-29 DIAGNOSIS — G4733 Obstructive sleep apnea (adult) (pediatric): Secondary | ICD-10-CM

## 2014-10-29 DIAGNOSIS — E662 Morbid (severe) obesity with alveolar hypoventilation: Secondary | ICD-10-CM | POA: Diagnosis not present

## 2014-10-29 MED ORDER — GLIMEPIRIDE 2 MG PO TABS: 2.0000 mg | ORAL_TABLET | Freq: Every day | ORAL | Status: DC

## 2014-10-29 NOTE — Progress Notes (Signed)
Pre-visit discussion using our clinic review tool. No additional management support is needed unless otherwise documented below in the visit note.  

## 2014-10-29 NOTE — Patient Instructions (Addendum)
We are changing your glipizide to glimepiride.  Start with 2 mg daily ,  Increase to 4 mg daily after one week  If yor blood sugars are still elevated and your are not having any lows   Continue taking the Januvia   Check fasting and and 2 hours after lunch or  after dinner  We are checking your thyroid function today to see if your medication needs adjustment  You are gaining weight despite eating less because your metabolism is very slow.  You need to resume some form of exercise daily, even if you are tired.     Use the nystatin powder two times daily in your pubic area in the crease to treat the yeast infection.    Please take a probiotic ( Align, Floraque or Culturelle) for 3 weeks because of your recent  antibiotic to prevent a serious antibiotic associated diarrhea  Called" clostridium dificile colitis" ( should also help prevent   vaginal yeast infection)

## 2014-10-29 NOTE — Assessment & Plan Note (Signed)
It is reassuring that the CT scans have demonstrated a size stability in the past 5 years. At the next imaging interval, would however repeat CT abdomen with adrenal protocol for further determination of imaging characteristics. Would recommend repeat CT in May 2016 .  Based on prior workup, it appears that adrenal adenoma is probably nonfunctional. Will round out this year's workup by  Ordering 24 hour urine tests.   Her BP in clinic today is well controlled. She has multiple other issues regarding thyroid and DM that we did not address today- I have however made an appointment for her to visit her PCP tomorrow-since PCP managing this- will send Dr [name] a note.

## 2014-10-29 NOTE — Progress Notes (Signed)
Patient ID: Denise Sharp, female   DOB: April 03, 1946, 69 y.o.   MRN: [id number]   Patient Active Problem List   Diagnosis Date Noted  . Hypothyroidism 10/31/2014  . Candida rash of groin 10/31/2014  . Obesity hypoventilation syndrome 08/13/2014  . Frequent falls 06/23/2014  . Chronic cough 05/05/2014  . History of adrenal adenoma 03/24/2014  . Cervicogenic headache 03/23/2014  . Syncope and collapse 03/12/2014  . Unspecified vitamin D deficiency 01/06/2014  . Abdominal pain, acute, right lower quadrant 12/03/2013  . Rectal bleeding 12/03/2013  . Weight gain 12/03/2013  . Headache(784.0) 11/23/2013  . Obesity 08/25/2013  . Abdominal discomfort in right lower quadrant 08/04/2013  . Diabetic polyneuropathy 03/25/2013  . Abnormality of gait 03/25/2013  . Multiple pulmonary nodules 03/20/2013  . SOB (shortness of breath) 03/20/2013  . Anemia 08/06/2012  . Iron deficiency anemia 08/06/2012  . OSA on CPAP 03/02/2012  . Insomnia secondary to anxiety 02/29/2012  . Bundle branch block, left 01/07/2012  . Sciatica 09/05/2011  . Cervical stenosis of spinal canal 06/14/2011  . Adrenal mass 03/13/2011  . Restless legs syndrome 03/13/2011  . Degenerative disk disease 03/13/2011  . Hypertension 03/12/2011  . DM type 2 with diabetic peripheral neuropathy 03/12/2011  . Hearing loss, right 03/12/2011    Subjective:  CC:   Chief Complaint  Patient presents with  . Diabetes    Increase in blood sugar, CBG's spiked to 240. But mainly in between 140 and 160    HPI:   Denise Sharp is a 69 y.o. female who presents for Increased blood sugars.  Patient voiced over 10 various complaints while at Endocronology visit yesterday for adrenal adenoma. And was advised to follow up with me.  Had replacement of Interstim battery on Monday by urologist dr Jacqlyn Larsen at [facility].   1) Blood sugars have been elevated to over 200 on Januvia once daily and glipizide only once daily  For the last two weeks. Has  not been taking prednisone recently.  Lab Results  Component Value Date   HGBA1C 6.8* 08/13/2014   2) Fatigue:  Multifactorial.  Has OSa wearing CPAP a minimum  Of 6 hours per note and during the day during naps. Despite wearing it she has persistent fatigue,  Falling asleep during the day with all passive activities.  She has been unable to exercise due to  pressure ulcer on left foot which renderd her inactive from March through September 2015.  Has not been exercising at all since February 2016 but states that prior to that she was  was riding the bike on a daily basis after the nonweight bearing restrictions were lifted by podiatry.    3) Has been on 3 rounds of abx recently,  currently on one    Past Medical History  Diagnosis Date  . Diabetes mellitus   . Depression   . Hypertension   . Cervical spinal stenosis 1994    due to trauma to back ([employer] accident), has intermittent paralysis and parasthesias  . Adrenal mass   . IBS (irritable bowel syndrome)   . Obstructive sleep apnea     using CPAP  . Anemia     iron deficient post  2 unit txfsn 2009, normal endo/colonoscopy by [facility]  . Esophageal stenosis March 2011    with transietn outlet obstruction by food, cleared by EGD   . LBBB (left bundle branch block)   . Obesity   . Apnea   . Headache(784.0)   . Arthritis   .  Restless leg syndrome   . Dizziness     chronic dizziness  . Adrenal mass, left   . 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 Abnormality of gait 03/25/2013  . Syncope and collapse 03/12/2014  . Cervicogenic headache 03/23/2014    Past Surgical History  Procedure Laterality Date  . Gastric bypass    . Abdominal hysterectomy    . Gastric bypass  2000, 2005    Dr. Debroah Loop  . Appendectomy    . Gallbladder surgery  1976  . Rotator cuff repair      right  . Joint replacement  2007    bilateral knee. [name],  [name]  . Spine surgery  1995    [place]  . Interstim implant placement  April 2013     [place]  . Gallbladder surgery  resection  . Total abdominal hysterectomy w/ bilateral salpingoophorectomy  1974       The following portions of the patient's history were reviewed and updated as appropriate: Allergies, current medications, and problem list.    Review of Systems:   Patient denies headache, fevers, malaise, unintentional weight loss, skin rash, eye pain, sinus congestion and sinus pain, sore throat, dysphagia,  hemoptysis , cough, dyspnea, wheezing, chest pain, palpitations, orthopnea, edema, abdominal pain, nausea, melena, diarrhea, constipation, flank pain, dysuria, hematuria, urinary  Frequency, nocturia, numbness, tingling, seizures,  Focal weakness, Loss of consciousness,  Tremor, insomnia, depression, anxiety, and suicidal ideation.     History   Social History  . Marital Status: Married    Spouse Name: [name]   . Number of Children: 2  . Years of Education: College    Occupational History  .     Social History Main Topics  . Smoking status: Never Smoker   . Smokeless tobacco: Never Used  . Alcohol Use: No  . Drug Use: No  . Sexual Activity: Not Currently   Other Topics Concern  . Not on file   Social History Narrative   Patient lives at home with husband [name].    Patient has 2 children.    Patient has [employer].    Patient is on disability.    Patient is right handed.     Objective:  Filed Vitals:   10/29/14 1011  BP: 116/74  Pulse: 66  Temp: 98.7 F (37.1 C)  Resp: 12     General appearance: alert, cooperative and appears stated age Ears: normal TM's and external ear canals both ears Throat: lips, mucosa, and tongue normal; teeth and gums normal Neck: no adenopathy, no carotid bruit, supple, symmetrical, trachea midline and thyroid not enlarged, symmetric, no tenderness/mass/nodules Back: symmetric, no curvature. ROM normal. No CVA tenderness. Lungs: clear to auscultation bilaterally Heart: regular rate and rhythm, S1, S2  normal, no murmur, click, rub or gallop Abdomen: soft, non-tender; bowel sounds normal; no masses,  no organomegaly Pulses: 2+ and symmetric Skin: Skin color, texture, turgor normal. No rashes or lesions Lymph nodes: Cervical, supraclavicular, and axillary nodes normal.  Assessment and Plan:  DM type 2 with diabetic peripheral neuropathy Using Januvia once daily and glipzide once daily due to intolerance to glipizide due to persistent nausea.   Last a1c 6.8 in late january but sugars now 140 to 160 and evening sugars have been elevated as well .  Trial of glimepiride.  If not tolerated will start insulin ans the newer agents would be relatively C/i given her bladder issues.    Hypothyroidism Mild, diagnosed with TSH of 5 and multiple symptoms.  TSH if 0.4 on 50 mcg daily.  Will have her skip one dose weekly.  Repeat in 6 weeks.    Obesity hypoventilation syndrome Suspected based on her body habitus,  Known diagnosis of OSA and recurrent episodes of "syncope" while sitting which are not LOC but falling asleep.  I have addressed  BMI and recommended a low glycemic index diet utilizing smaller more frequent meals to increase metabolism.  I have also recommended that patient start exercising with a goal of 30 minutes of aerobic exercise a minimum of 5 days per week.   Candida rash of groin Nystatin powder prescribed to use in inguinal areas and under pannus.   A total of 25 minutes of face to face time was spent with patient more than half of which was spent in counselling about the above mentioned conditions  and coordination of care   Updated Medication List Outpatient Encounter Prescriptions as of 10/29/2014  Medication Sig  . albuterol (PROVENTIL HFA;VENTOLIN HFA) 108 (90 BASE) MCG/ACT inhaler Inhale 2 puffs into the lungs every 6 (six) hours as needed for wheezing or shortness of breath.  . benzonatate (TESSALON) 200 MG capsule Take 1 capsule (200 mg total) by mouth 3 (three) times  daily as needed for cough.  . chlorpheniramine-HYDROcodone (TUSSIONEX PENNKINETIC ER) 10-8 MG/5ML LQCR Take 5 mLs by mouth at bedtime as needed for cough.  . Cholecalciferol (VITAMIN D3) 10000 UNITS capsule Take 10,000 Units by mouth once a week.  . cyanocobalamin (,VITAMIN B-12,) 1000 MCG/ML injection 1000 ml (1 ml) injection twice monthly  . diazepam (VALIUM) 10 MG tablet Take 1 tablet (10 mg total) by mouth at bedtime as needed for anxiety.  [name] estradiol (ESTRACE) 0.5 MG tablet Take 1 tablet (0.5 mg total) by mouth daily.  . eszopiclone (LUNESTA) 2 MG TABS tablet Take 1 tablet (2 mg total) by mouth at bedtime. Take immediately before bedtime  . fluticasone (FLONASE) 50 MCG/ACT nasal spray Place 2 sprays into both nostrils daily.  . furosemide (LASIX) 20 MG tablet Take 1 tablet (20 mg total) by mouth daily.  . Glucose Blood (BAYER BREEZE 2 TEST) DISK Use one test strip each time glucose needs testing.  . hydrALAZINE (APRESOLINE) 25 MG tablet Take 1 tablet (25 mg total) by mouth 3 (three) times daily. As needed for bp > 160  . hydrocodone-acetaminophen (LORCET-HD) 5-500 MG per capsule Take 1 capsule by mouth every 6 (six) hours as needed.  [name] levothyroxine (SYNTHROID, LEVOTHROID) 50 MCG tablet Take 1 tablet (50 mcg total) by mouth daily.  [name] losartan (COZAAR) 50 MG tablet   . nebivolol (BYSTOLIC) 5 MG tablet Take 1 tablet (5 mg total) by mouth daily.  [name] nystatin (MYCOSTATIN) powder Apply topically 2 (two) times daily.  [name] oxyCODONE-acetaminophen (PERCOCET/ROXICET) 5-325 MG per tablet Take 1 tablet by mouth every 6 (six) hours as needed.  . pregabalin (LYRICA) 50 MG capsule Take 1 tablet in the morning and 2 tablets in the evening.  [name] rOPINIRole (REQUIP) 1 MG tablet Take 1 tablet (1 mg total) by mouth at bedtime.  . sitaGLIPtin (JANUVIA) 100 MG tablet Take 1 tablet (100 mg total) by mouth daily.  . [EXPIRED] sulfamethoxazole-trimethoprim (BACTRIM,SEPTRA) 400-80 MG per tablet Take by mouth.  .  traMADol (ULTRAM) 50 MG tablet Take 50 mg by mouth 4 (four) times daily as needed.  [name] glimepiride (AMARYL) 2 MG tablet Take 1 tablet (2 mg total) by mouth daily with breakfast.     Orders Placed This Encounter  Procedures  . T4 AND TSH  . CBC and differential  . Basic metabolic panel  . Nocturnal polysomnography (NPSG)    No Follow-up on file.

## 2014-10-29 NOTE — Assessment & Plan Note (Addendum)
Using Januvia once daily and glipzide once daily due to intolerance to glipizide due to persistent nausea.   Last a1c 6.8 in late january but sugars now 140 to 160 and evening sugars have been elevated as well .  Trial of glimepiride.  If not tolerated will start insulin ans the newer agents would be relatively C/i given her bladder issues.

## 2014-10-30 LAB — T4 AND TSH
T4, Total: 9.5 ug/dL (ref 4.5–12.0)
TSH: 0.408 u[IU]/mL — ABNORMAL LOW (ref 0.450–4.500)

## 2014-10-31 ENCOUNTER — Encounter: Payer: Self-pay | Admitting: Internal Medicine

## 2014-10-31 DIAGNOSIS — B3789 Other sites of candidiasis: Secondary | ICD-10-CM | POA: Insufficient documentation

## 2014-10-31 DIAGNOSIS — E039 Hypothyroidism, unspecified: Secondary | ICD-10-CM | POA: Insufficient documentation

## 2014-10-31 NOTE — Assessment & Plan Note (Signed)
Nystatin powder prescribed to use in inguinal areas and under pannus.

## 2014-10-31 NOTE — Assessment & Plan Note (Signed)
Suspected based on her body habitus,  Known diagnosis of OSA and recurrent episodes of "syncope" while sitting which are not LOC but falling asleep.  I have addressed  BMI and recommended a low glycemic index diet utilizing smaller more frequent meals to increase metabolism.  I have also recommended that patient start exercising with a goal of 30 minutes of aerobic exercise a minimum of 5 days per week.

## 2014-10-31 NOTE — Assessment & Plan Note (Signed)
Mild, diagnosed with TSH of 5 and multiple symptoms.  TSH if 0.4 on 50 mcg daily.  Will have her skip one dose weekly.  Repeat in 6 weeks.

## 2014-11-06 NOTE — Consult Note (Signed)
PATIENT NAME:  Denise Sharp, Denise Sharp MR#:  [id number] DATE OF BIRTH:  Nov 14, 1945  DATE OF CONSULTATION:  02/26/2014  REFERRING PHYSICIAN:   CONSULTING PHYSICIAN:  Leotis Pain, MD  HISTORY OF PRESENT ILLNESS: A 69 year old female with a past medical history of anemia, irritable bowel syndrome, restless leg syndrome, vitamin D deficiency, obstructive sleep apnea, history of headaches, degenerative disk disease, specifically C4-C6 region, osteoarthritis, peripheral neuropathy, presenting with transient periods of confusion, disorientation, and suspected loss of consciousness. Information obtained from charts, patient, and patient's husband at the bedside. Last night, the patient went otherwise have some [place] food. While going home and sitting in the car, the patient developed a severe headache. A couple of times through the care ride, the patient felt as if she knew what was going on, but felt like she was outside of her body. As per the patient's husband, he believes that there was loss of consciousness transiently lasting a couple of seconds up to 3 times prior to arrival to the Emergency Department. The patient also complained of left occipital headache radiating to the left temporal region associated with scotomas, which are described as blue lights flashing. When questioned about her headaches, the patient states that she has had daily headaches for a prolonged period of time. She only takes a muscle relaxant for it. Sometimes it is so severely impairing that she is unable to drive and has to stop. Headaches are not associated with any nausea, vomiting, and described as starting in the left occipital region, radiating into the left temporal lobe. On admission to the Emergency Department, the patient had orthostatic testing done. At that time, there was no significant difference, but at home the patient states that her blood pressure was very low.   PAST MEDICAL HISTORY: As above.   SOCIAL HISTORY:  Lives with the husband.   REVIEW OF SYSTEMS: No fever. No chills. Currently, no weakness on one side of the body compared to the other. No shortness of breath. No chest pain. No abdominal pain.   PHYSICAL EXAMINATION:  VITAL SIGNS: Include a blood pressure 136/79 while lying down, and blood pressure 128/81 while sitting.  NEUROLOGIC: Patient's speech is fluent. Alert, awake, oriented to time, place, and location. Extraocular movements are intact. Facial sensation intact. Facial motor is intact. Tongue elevates symmetrically. Uvula is midline. Shoulder shrug intact. Motor strength appears to be 5/5 in bilateral upper and lower extremities. Reflexes 1+, symmetrical. Finger-to-nose intact. No coordination abnormalities. Gait not assessed.   IMPRESSION: A 69 year old female with multiple medical problems as described above, presents with out of body feeling, headache, hypotension. I think these episodes are likely syncopal in nature. I do not think this is a stroke or transient ischemic attack because strokes or transient ischemic attacks are a one-time event that happens and usually resolves. They are usually not stereotyped, and do not happen in the same vascular territory one after the other.   PLAN: We will consider staggering her antihypertensive medication, hydration. The patient does have headaches. This could have been also contributed to complicated migraine. The patient only takes a muscle relaxant for her headache. I would give her 1 gram magnesium sulfate injection at this point and over-the-counter magnesium 400 mg and vitamin B complex daily for headache prevention. I would not start any headache prevention medication at this point, as we want to see if these multivitamins would work. Physical therapy, occupational therapy. The patient does have a bladder stimulator in her spine, so she cannot obtain an  MRI, which I do not think she needs at this point anyway. Will place the patient on  antiplatelet therapy, otherwise discharge planning from a neurological standpoint.  Thank you, it was a pleasure seeing this patient.    ____________________________ Leotis Pain, MD yz:at D: 02/26/2014 14:22:28 ET T: 02/26/2014 15:15:51 ET JOB#: [id number]  cc: Leotis Pain, MD, <Dictator> Leotis Pain MD ELECTRONICALLY SIGNED 03/19/2014 16:05

## 2014-11-06 NOTE — H&P (Signed)
PATIENT NAME:  Denise Sharp, Denise Sharp MR#:  [id number] DATE OF BIRTH:  [birth year]  DATE OF ADMISSION:  02/26/2014   REFERRING PHYSICIAN: Valli Glance. Owens Shark, MD  PRIMARY CARE DOCTOR: Deborra Medina, MD  ADMIT DIAGNOSES:  1.  Transient ischemic attack.  2.  Dizziness.  3.  Weakness.  4.  Hypertension.    HISTORY OF PRESENT ILLNESS: This is a 69 year old Caucasian female admitted for possible TIA. The patient was brought in by EMS, following an episode that began after eating [place] food. The patient developed a severe headache, which evolved into extreme dizziness. The patient states that, while these symptoms were occurring, she "felt like she was in the room, but as if she were not really there, as if she were on the  outside looking in". The patient tried to stand at the restaurant, but could not, and was merely carried outside by her husband. She admits that she could not keep her balance at that time. She states that she closed her eyes to avoid any nausea while feeling dizzy, and began to see red and yellow sparkling lights. The patient the took some medicine when she got home and checked her blood pressure, which she states was initially [id number] systolic.  When she reportedly became more slurred in her speech and difficult to arouse, her husband called the rescue squad.   The patient has difficulty remembering events after that point. By the time she arrived to the hospital, however, her symptoms had essentially resolved. Then she states symptoms returned,  this time in the form of feeling a strange feeling on her left lateral face, as if novocaine were wearing off at the dentist's office. She had 2 similar spells while here in the Emergency Department. (These were not witnessed by physicians.) The symptoms resolved on the repeat occasions, after approximately 2 to 4 minutes.   REVIEW OF SYSTEMS:  CONSTITUTIONAL: The patient denies fever or weight loss.  EYES: The patient denies blurry vision, but  admits to seeing floaters, as stated above.  ENT: The patient denies nosebleeds or sore throat.   RESPIRATORY: The patient denies cough or wheeze.  CARDIOVASCULAR: The patient denies chest pain, orthopnea.  GASTROINTESTINAL: The patient denies nausea, vomiting, diarrhea, or abdominal pain.  GENITOURINARY: The patient denies dysuria, increased frequency, or hesitancy.  ENDOCRINE: The patient denies nocturia or intolerance to temperature change.  HEMATOLOGIC AND LYMPHATIC: The patient denies easy bruising or bleeding.  INTEGUMENTARY: The patient denies rashes or lesions.  MUSCULOSKELETAL: The patient denies arthralgias or myalgias.  NEUROLOGIC: The patient admits to transient numbness as well as some dizziness and headache.  PSYCHIATRIC: The patient denies depression or anxiety.   PAST MEDICAL HISTORY: Known left adrenal mass, IBS, restless leg syndrome, peripheral neuropathy, degenerative disk disease, anemia, intestinal prolapse, osteoarthritis, Vitamin D deficiency, obstructive sleep apnea, insomnia, headache, and plantar fasciitis.   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 She reportedly has an implanted device in her back; which sounds like a neurostimulator.   FAMILY HISTORY: Father with cardiovascular disease, prostate cancer and congestive heart failure. Her mother had coronary artery disease as well. There is diabetes throughout the family.   SOCIAL HISTORY: The patient lives with her husband. She does not smoke, drink or do any drugs.   MEDICATIONS:  1.  The patient takes tramadol 50 mg 1-2 tabs p.o. once daily for leg cramps or pain, with instructions that she may take up to 4  times a day as needed.  2.  Tizanidine 2 mg 1 tablet p.o. b.i.d. and 2 tablets q.h.s.  3.  Ropinirole 1 mg 1 tablet at bedtime as needed for restless legs.  4.  Pantoprazole 40 mg 1 tablet p.o. daily.  5.  NovoLog FlexPen per  sliding scale.  6.  Magnesium oxide 400 mg 1 tablet p.o. daily.  7.  Lyrica 50 mg oral capsule 1 tablet p.o. daily at bedtime with instructions that she may increase to 100 mg as needed during the day as well.  8.  Lunesta 3 mg 1 tablet p.o. q.h.s.  9.  Januvia 100 mg 1 tablet p.o. every morning.  10.  Imodium AD 2 mg 1-3 tablets orally every 4-6 hours as needed for diarrhea.  11.  Hydralazine 25 mg 1 tablet p.o. t.i.d.  12.  Estradiol 0.5 mg 1 tablet p.o. q.h.s.  13.  Diazepam 10 mg 1 tablet p.o. q.h.s. for leg cramps.  14.  Cobalamin 1000 mcg per mL, 1 mL injection once a month.  15.  Bystolic 10 mg 1 tablet p.o. every morning.  16.  Acetaminophen and oxycodone 325 mg/5 mg, 1 tablet p.o. every 6 hours as needed for pain.   ALLERGIES: THE PATIENT IS ALLERGIC TO LASIX AND TETRACYCLINE.   PERTINENT LABORATORY RESULTS AND RADIOGRAPHIC FINDINGS: The patient's urinalysis was negative. One troponin was negative. Potassium was 3.4. Hematocrit  33.2. 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    PHYSICAL EXAMINATION:  VITAL SIGNS: Temperature 97.7, pulse 55, respiratory rate 18, blood pressure 128/67, pulse oximetry 96% on room air.  GENERAL: The patient is alert and oriented x 3, in no apparent distress.  NECK: Trachea is midline. No adenopathy.  CHEST: Symmetric and atraumatic.  CARDIOVASCULAR: Regular rate and rhythm. Normal S1, S2. No rubs, clicks, or murmurs.  LUNGS: Clear to auscultation bilaterally. Normal effort and excursion.  ABDOMEN: Positive bowel sounds. Soft, nontender, nondistended. No hepatosplenomegaly.  GENITOURINARY: Deferred.  MUSCULOSKELETAL: Has 5/5 strength in upper and lower extremities bilaterally.  SKIN: No rashes or lesions.  EXTREMITIES: No clubbing, cyanosis, or edema.  NEUROLOGIC: Cranial nerves II through  XII grossly intact. Heel-to-shin test is normal. The patient does not have any past-pointing.  PSYCHIATRIC: Mood is normal. Affect is congruent.   ASSESSMENT AND PLAN: This is a 69 year old female admitted for a possible transient ischemic attack. The patient seems to have had multiple episodes, not all of which are necessarily characteristic of ischemic change, transient or otherwise.  1.  Transient ischemic attack. No neurologic consult has been ordered at this time, as it is unclear if the patient's spinal stimulator is MRI safe. It presumably is, if it has been placed within the last 10 years. It also seems that the patient's symptoms may have been related to some labile blood pressure. This may be due to excess salt in some foods that one may find in [place]. Also, the patient has had a history of very strong headaches now, which have specifically been categorized as migraines in our documentation; however, she could certainly have had a complicated migraine that may produce some of these symptoms, particularly those of the floaters in her vision.  2.  Dizziness, which has currently resolved.  3.  Weakness. There is none present on my physical exam at this time. The patient is on a lot  of medications that may have side effects of weakness, dizziness and lightheadedness. In combination, these unwanted effects may produce some symptoms that could mimic TIAs. I have held all of her narcotics. The primary team may re add them, as well as her muscle relaxers at their discretion.  4.  Hypertension. This is controlled at this time. The patient reports labile blood pressure prior to and throughout the episode. The patient's blood pressure shot up to 150, according to her, prior to EMS while arriving. If this is true, I will check orthostatic blood pressures. She may indeed be slightly dehydrated, which could contribute to some of these symptoms as well.  5.  Deep vein thrombosis prophylaxis with  sequential compression devices.  6.  Gastrointestinal prophylaxis with pantoprazole.  7.  Patient is a Full Code.   TIME SPENT ON ADMISSION ORDERS AND PATIENT CARE: Approximately 45 minutes.    ____________________________ Norva Riffle. Marcille Blanco, MD msd:MT D: 02/26/2014 10:37:34 ET T: 02/26/2014 11:11:50 ET JOB#: [id number]  cc: Norva Riffle. Marcille Blanco, MD, <Dictator> Norva Riffle Deshawn Skelley MD ELECTRONICALLY SIGNED 02/28/2014 0:45

## 2014-11-06 NOTE — Consult Note (Signed)
General Aspect PCP: Judieth Keens, MD Cardiologist: Jerilynn Mages. Denise Anon, MD _____________  Past Medical History 1.  Labile HTN 2.  L Adrenal Adenoma      a. Prev neg pheo w/u.      b. Stable by CT 11/2013.      c. Followed by Dr. Jacqlyn Larsen 3.  Morbid Obesity      a. S/P Gastric Bypass x 2.      b. Chronic diarrhea since bypass with nl colonoscopy 12/2013. 4.  DM 5.  Depression 6.  Chest Pain      a. Occurs in setting of HTN episodes      b. 07/2012 Cath [id number]): LM nl, LAD/LCX/RCA minor irregs, nl EF.  Nl renal arteries. 7.  Chronic DOE      a. 03/2012 Echo: EF 55-65%, nl wall motion.  Mild AI. 8.  IBS 9.  OSA      a. On CPAP. 10.  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  LBBB 13.  B12 deficiency 14.  IDA - followed by heme-onc 15.  H/O urinary frequency and fecal incontinence      a. s/p interstim - followed by Dr. 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  Intertriginous candiadiasis 18.  S/P abdominal hysterectomy 19.  S/P appendectomy 20.  S/P cholecystectomy - 1976 21.  S/P R Rotator Cuff repair 22.  S/P Bilat total knee arthroplasties 23.  S/P Spinal surgery - 1995 _____________  69 y/o female with the above complex problem list.  She has a long h/o h/o dizziness, periodic hypertension associated with chest pain, and also tachycardia with any sort of prolonged activity, even if it is not particularly taxing.  She does have a h/o L adrenal adenoma however w/u for pheo has been negative in the past and tumor size is stable as of CT of the Abd 11/2013.  Cardiac w/u in the past has included a nl echo in 03/2012 and b/c of intermittent chest pain, a cath in 07/2012, which revealed diffuse minor irregularities with nl LV fxn.   Present Illness She is s/p gastric bypass x 2 with resultant IDA, B12 def, and chronic diarrhea.  Unfortunately, she has put weight back.  Earlier this year, she was found to have a foot ulcer and says that due to it, she was more or less bedridden for several months.   Only in the past few mos has she been able to ambulate @ home and in doing so, she has noted significant lower extremity atrophy, wkns, and also positional dizziness with unsteady gait.  She has also noted frequent headaches that start as occipital and move to her left temporal area.  Dizziness is worse during H/A's.  Yesterday, she went out to eat with her husband and while at [employer], she began to feel "weird" stating that she felt like she was fading in and out of consciousness. This was assoc with H/A, dizziness, c/p, and dyspnea, and she assumed that her BP was up.  No diaphoresis. She had her husband help her to the car and after that her next memory is that of being awakened by her husband once they were home (~ 10 mins later). He helped her into the house and into bed. Her SBP was 102. She believes she lost consciousness again and her next memory was that of an EMT taking her BP (150's - her husband had called EMS).   She was taken to [facility] and once here, she noted intermittent  left perioral and facial numbness.  ECG showed sinus brady @ 55, w/o acute changes.  Ti was <0.02. Head CT was neg for acute findings but did show chronic changes. K was low @ 3.4. She was admitted with plan for neur eval and MRI once spinal stim was cleared for safety. She has not had any events on the monitor. Echo was just completed.  She is currently symptom free.  BP has been stable since admission.   Physical Exam:  GEN well developed, well nourished, pleasant, nad.   HEENT pink conjunctivae, moist oral mucosa   NECK supple  obese, no bruits.  difficult to assess jvp.   CARD Regular rate and rhythm  Normal, S1, S2  No murmur   ABD denies tenderness  soft  normal BS  obese.   LYMPH negative neck   EXTR negative cyanosis/clubbing, negative edema   SKIN normal to palpation   NEURO cranial nerves intact, motor/sensory function intact   PSYCH alert, A+O to time, place, person   Review of Systems:   Subjective/Chief Complaint Near syncope   General: Weakness  unsteady gait.   Skin: No Complaints   ENT: Dizziness w/ h/o vertigo.   Eyes: No Complaints   Neck: No Complaints   Respiratory: Chronic DOE.   Cardiovascular: Chronic DOE and chest pain in the setting of elevated BPs.   Gastrointestinal: Chronic diarrhea.   Genitourinary: Has bladder stim - works well for her.   Vascular: No Complaints   Musculoskeletal: No Complaints   Neurologic: Headache  associated with dizziness.   Hematologic: No Complaints   Endocrine: No Complaints   Psychiatric: No Complaints   Review of Systems: All other systems were reviewed and found to be negative   Medications/Allergies Reviewed Medications/Allergies reviewed   Family & Social History:  Family and Social History:  Family History Coronary Artery Disease  Father had CAD and HTN.   Social History negative tobacco, negative ETOH, negative Illicit drugs   Place of Living Home  Lives in [place] with husband.  Does not routinely exercise.          Admit Diagnosis:   TIA TRANSIENT ISCHEMIC ATTACK.: Onset Date: 26-Feb-2014, Status: Active, Description: TIA TRANSIENT ISCHEMIC ATTACK.  Home Medications: Medication Instructions Status  Bystolic 10 mg oral tablet 1 tab(s) orally once a day (in the morning) Active  Januvia 100 mg oral tablet 1 tab(s) orally once a day (in the morning) Active  estradiol 0.5 mg oral tablet 1 tab(s) orally once a day (at bedtime) Active  NovoLog FlexPen 100 units/mL subcutaneous solution  subcutaneous , As Needed per sliding scale: BS >150 = 4 units, BS >200 = 6 units, BS >250 = 8 units Active  ropinirole 1 mg oral tablet 1 tab(s) orally once a day (at bedtime) for restless legs Active  tramadol 50 mg oral tablet 1-2 tab(s) orally once a day (at bedtime) for leg cramps and pain (may take up to 4 times a day as needed) Active  diazepam 10 mg oral tablet 1 tab(s) orally once a day (at bedtime) for  leg cramps Active  Lyrica 50 mg oral capsule 1 cap(s) orally once a day (at bedtime) (may increase to 100 mg as needed and can take during the day also) Active  cyanocobalamin 1000 mcg/mL injectable solution 1 milliliter(s) injectable once a month on the 12th of each month Active  acetaminophen-hydrocodone 500 mg-5 mg oral tablet 1 tab(s) orally every 4 to 6 hours, As Needed- for Pain  Active  Imodium A-D 2 mg oral tablet 1-3 tab(s) orally every 4 to 6 hours, As Needed- for uncontrolled Diarrhea (may take up to 6-10 per 24 hours) Active  Lunesta 3 mg oral tablet 1 tab(s) orally once a day (at bedtime) Active  hydrALAZINE 25 mg oral tablet 1 tab(s) orally 3 times a day Active  magnesium oxide 400 mg (241.3 mg elemental magnesium) oral tablet 1 tab(s) orally once a day Active  acetaminophen-oxycodone 325 mg-5 mg oral tablet 1 tab(s) orally every 6 hours, As Needed- for Pain  Active  pantoprazole 40 mg oral delayed release tablet 1 tab(s) orally once a day Active  acetaminophen-oxyCODONE 325 mg-5 mg oral tablet 1 tab(s) orally every 6 hours, As Needed - for Pain Active  tiZANidine 2 mg oral tablet 1 tab(s) orally 2 times a day and 2 qhs Active   Lab Results:  Cardiology:  13-Aug-15 21:50   Ventricular Rate 55  Atrial Rate 55  P-R Interval 136  QRS Duration 76  QT 488  QTc 466  R Axis 20  T Axis 23  ECG interpretation Sinus bradycardia Otherwise normal ECG No previous ECGs available ----------unconfirmed---------- Confirmed by OVERREAD, NOT ([id number]), editor PEARSON, BARBARA ([id number]) on 02/26/2014 8:42:21 AM  Routine Chem:  13-Aug-15 21:53   Glucose, Serum  223  BUN 12  Creatinine (comp) 1.05  Sodium, Serum 142  Potassium, Serum  3.4  Chloride, Serum 106  CO2, Serum 22  Calcium (Total), Serum  7.6  Anion Gap 14  Osmolality (calc) 290  eGFR (African American) >60  eGFR (Non-African American)  55 (eGFR values <66m/min/1.73 m2 may be an indication of chronic kidney disease  (CKD). Calculated eGFR is useful in patients with stable renal function. The eGFR calculation will not be reliable in acutely ill patients when serum creatinine is changing rapidly. It is not useful in  patients on dialysis. The eGFR calculation may not be applicable to patients at the low and high extremes of body sizes, pregnant women, and vegetarians.)  Cardiac:  13-Aug-15 21:53   Troponin I < 0.02 (0.00-0.05 0.05 ng/mL or less: NEGATIVE  Repeat testing in 3-6 hrs  if clinically indicated. >0.05 ng/mL: POTENTIAL  MYOCARDIAL INJURY. Repeat  testing in 3-6 hrs if  clinically indicated. NOTE: An increase or decrease  of 30% or more on serial  testing suggests a  clinically important change)  Routine UA:  13-Aug-15 23:37   Color (UA) Yellow  Clarity (UA) Clear  Glucose (UA) 50 mg/dL  Bilirubin (UA) Negative  Ketones (UA) Negative  Specific Gravity (UA) 1.008  Blood (UA) Negative  pH (UA) 5.0  Protein (UA) Negative  Nitrite (UA) Negative  Leukocyte Esterase (UA) Negative (Result(s) reported on 25 Feb 2014 at 11:56PM.)  RBC (UA) <1 /HPF  WBC (UA) NONE SEEN  Bacteria (UA) NONE SEEN  Epithelial Cells (UA) NONE SEEN  Result(s) reported on 25 Feb 2014 at 11:56PM.  Routine Hem:  13-Aug-15 21:53   WBC (CBC) 10.0  RBC (CBC)  3.71  Hemoglobin (CBC)  10.6  Hematocrit (CBC)  33.2  Platelet Count (CBC) 266  MCV 90  MCH 28.6  MCHC  31.9  RDW  15.2  Neutrophil % 68.8  Lymphocyte % 22.6  Monocyte % 6.5  Eosinophil % 1.3  Basophil % 0.8  Neutrophil #  6.9  Lymphocyte # 2.3  Monocyte # 0.6  Eosinophil # 0.1  Basophil # 0.1 (Result(s) reported on 25 Feb 2014 at 11:28PM.)   EKG:  EKG  Interp. by me   Interpretation EKG shows sinus brady, 55, no acute st/t changes.   Radiology Results:  Cardiology:    14-Aug-15 13:26, Echo Doppler  Echo Doppler   REASON FOR EXAM:      COMMENTS:       PROCEDURE: [facility] - ECHO DOPPLER COMPLETE(TRANSTHOR)  - Feb 26 2014  1:26PM      RESULT: Echocardiogram Report    Patient Name:   Denise Sharp Date of Exam: 02/26/2014  Medical Rec #:  [id number]            Custom1:  Date of Birth:  10-08-1945         Height:       64.0 in  Patient Age:    69 years          Weight:       242.0 lb  Patient Gender: F                 BSA:          2.12 m??    Indications: CVA  Sonographer:    Sherrie Sport RDCS  Referring Phys: Rosilyn Mings, S    Sonographer Comments: Challenging image quality in the apical view.    Summary:   1. Left ventricular ejection fraction, by visual estimation, is 60 to   65%.   2. Normal global left ventricular systolic function.   3. Normal right ventricular size and systolic function.   4. Mildly dilated left atrium.   5. Mild mitral valve regurgitation.   6. Mild tricuspid regurgitation.   7. Mildly elevated pulmonary artery systolic pressure.  2D AND M-MODE MEASUREMENTS (normal ranges within parentheses):  Left Ventricle:          Normal  IVSd (2D):      1.05 cm (0.7-1.1)  LVPWd (2D):     1.12 cm (0.7-1.1) Aorta/LA:                  Normal  LVIDd (2D):     4.34 cm (3.4-5.7) Aortic Root (2D): 3.20 cm (2.4-3.7)  LVIDs (2D):     2.24 cm  Left Atrium (2D): 4.30 cm (1.9-4.0)  LV FS (2D):     48.4 %   (>25%)  LV EF (2D):     80.0 %   (>50%)                                    Right Ventricle:                                    RVd (2D):        5.46 cm  LV DIASTOLIC FUNCTION:  MV Peak E: 1.19 m/s E/e' Ratio: 21.40  MV Peak A: 1.03 m/s Decel Time: 246 msec  E/A Ratio: 1.16  SPECTRAL DOPPLER ANALYSIS (where applicable):  Mitral Valve:  MV P1/2 Time: 71.34 msec  MV Area, PHT: 3.08 cm??  Aortic Valve: AoV Max Vel: 1.35 m/s AoV Peak PG: 7.3 mmHg AoV Mean PG:  LVOT Vmax: 1.03 m/s LVOT VTI:  LVOT Diameter: 2.20 cm  AoV Area, Vmax: 2.90 cm?? AoV Area, VTI:  AoV Area, Vmn:  Tricuspid Valve and PA/RV Systolic Pressure: TR Max Velocity: 2.90 m/s RA   Pressure: 5 mmHg RVSP/PASP: 38.8 mmHg  Pulmonic  Valve:  PV  Max Velocity: 0.86 m/s PV Max PG: 3.0 mmHg PV Mean PG:    PHYSICIAN INTERPRETATION:  Left Ventricle: The left ventricular internal cavity size was normal. No   left ventricular hypertrophy. Global LV systolic function was normal.   Left ventricular ejection fraction, by visual estimation, is 60 to 65%.   Spectral Doppler shows normal pattern of LV diastolic filling.  Right Ventricle: Normal right ventricular size, wall thickness, and     systolic function. The right ventricular size is normal. Global RV   systolic function is normal.  Left Atrium: The left atrium is mildly dilated.  Right Atrium: The right atrium is normal in size.  Pericardium: There is no evidence of pericardial effusion.  Mitral Valve: The mitral valve is normal in structure. Mild mitral valve   regurgitation is seen. MAC noted.  Tricuspid Valve: The tricuspid valve is normal. Mild tricuspid   regurgitation is visualized. The tricuspid regurgitant velocity is 2.90   m/s, and with an assumed right atrial pressure of 5 mmHg, the estimated   right ventricular systolic pressure is mildly elevated at 38.8 mmHg.  Aortic Valve: The aortic valve is normal. Mild aortic valve sclerosis is   present, with no evidence of aortic valve stenosis. No evidence of aortic   valve regurgitation is seen.  Pulmonic Valve: The pulmonic valve is normal. Trace pulmonic valve     regurgitation.  Aorta: The aortic root and ascending aorta are structurally normal, with   no evidence of dilitation.    [id number] Ida Rogue MD  Electronically signed by [id number] Ida Rogue MD  Signature Date/Time: 02/26/2014/4:48:12 PM    *** Final ***    IMPRESSION: .        Verified By: [name], M.D., MD  CT:    13-Aug-15 23:44, CT Head Without Contrast  CT Head Without Contrast   REASON FOR EXAM:    altered mental status, unsteady gait,  COMMENTS:       PROCEDURE: CT  - CT HEAD WITHOUT CONTRAST  - Feb 25 2014 11:44PM      CLINICAL DATA:  Syncope.    EXAM:  CT HEAD WITHOUT CONTRAST    TECHNIQUE:  Contiguous axial images were obtained from the base of the skull  through the vertex without intravenous contrast.    COMPARISON:  Head CT 01/04/2014.  FINDINGS:  Old lacunar infarcts in the left basal ganglia. Physiologic  calcifications in the basal ganglia bilaterally. Patchy and  confluent areas of decreased attenuation are noted throughout the  deep and periventricular white matter of the cerebral hemispheres  bilaterally, compatible with chronic microvascular ischemic disease.  No acute intracranial abnormalities. Specifically, no evidence of  acute intracranial hemorrhage, no definite findings of  acute/subacute cerebral ischemia, no mass, mass effect,  hydrocephalus or abnormal intra or extra-axial fluid collections.  Visualized paranasal sinuses and mastoids are well pneumatized. No  acute displaced skull fractures are identified.     IMPRESSION:  1. No acute intracranial abnormalities.  2. 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      Electronically Signed    By: Vinnie Langton M.D.    On: 02/26/2014 01:06         Verified By: Etheleen Mayhew, M.D.,    Lasix: Swelling  Tetracycline: Other  Vital Signs/Nurse's Notes: **Vital Signs.:   14-Aug-15 11:40  Vital Signs Type Routine  Temperature Temperature (F) 99  Celsius 37.2  Temperature Source oral  Pulse Pulse 63  Respirations Respirations 20  Systolic BP Systolic BP 072  Diastolic BP (mmHg) Diastolic BP (mmHg) 79  Mean BP 98  Systolic BP Systolic BP 257  Diastolic BP (mmHg) Diastolic BP (mmHg) 79  Pulse Lying Pulse Lying 98  Systolic BP Systolic BP 505  Diastolic BP (mmHg) Diastolic BP (mmHg) 81  Pulse Pulse Sitting 66  Systolic BP Systolic BP 183  Diastolic BP (mmHg) Diastolic BP (mmHg) 80  Pulse Standing Pulse Standing 71  Pulse  Ox % Pulse Ox % 96  Pulse Ox Activity Level  At rest  Oxygen Delivery Room Air/ 21 %    Impression 1.  Loss of consciousness:   Pt presented  following two episodes of near syncope/loss of consciousness? in the setting of severe headache, dizziness, chest pain, and dyspnea.  Her BP @ home was initially low (102) but not hypotensive, and subsequent BP by EMS was higher.  Initial episode sounds to have been ~ 10 mins in duration while the second episode was potentially > an hour.   She continued to have Ss of presyncope and developed left sided perioral and facial numbness in the ER in the setting of normal VS.   No events on tele and not  orthostatic by VS.   --review of echo shows nl LV fxn, making ventricular arrhythmias very unlikely.   --Doubt that these episodes represent cardiac syncope.   --Further w/u and mgmt per neuro.  2.  Chest Pain:   In setting of above.   No objective evidence of ischemia. Relatively nl cors on cath in 07/2012.  --No further cardiac ischemic w/u.  3.  Chronic DOE:    nl LV fxn.   Suspect deconditioning playing a major role in her DOE.  4.  HTN:   Long h/o labile HTN. She is concerned that her left adrenal adenoma may be playing a role though review of records indicates that pheo has been r/o in the past and most recent CT showed stable adenoma size.   She had nl renal arteries on cath in 07/2012.   She is on bysolic and hydralazine @ home.  Bystolic was held on admission here.  She did get a dose of hydralazine this AM.  Bp in the 358 to 251 systolic. Orthostatic negative --Will place hold parameters on hydralazine  5.  L Adrenal Adenoma:   See #4. She has f/u with Dr. Jacqlyn Larsen in September.  6.  Chronic dizziness:   She has seen ENT in the past and apparently dx with vertigo.  She did not tolerate PT/desensitization exercises.   Defer to IM.  7.  DM:  Per IM.  8.  Deconditioning:   Likely playing a major role in all or her symptoms.   --Rec PT/OT  eval.   Electronic Signatures: Rogelia Mire (NP)  (Signed 14-Aug-15 16:50)  Authored: General Aspect/Present Illness, History and Physical Exam, Review of System, Family & Social History, Home Medications, Labs, EKG , Radiology, Allergies, Vital Signs/Nurse's Notes, Impression/Plan Ida Rogue (MD)  (Signed 14-Aug-15 17:23)  Authored: General Aspect/Present Illness, History and Physical Exam, Review of System, Family & Social History, Health Issues, EKG , Radiology, Impression/Plan  Co-Signer: General Aspect/Present Illness, History and Physical Exam, Review of System, Family & Social History, Home Medications, Labs, EKG , Radiology, Allergies, Vital Signs/Nurse's Notes, Impression/Plan   Last Updated: 14-Aug-15 17:23 by Ida Rogue (MD)

## 2014-11-06 NOTE — Discharge Summary (Signed)
PATIENT NAME:  Denise Sharp, Denise Sharp MR#:  [id number] DATE OF BIRTH:  [birth year]  DATE OF ADMISSION:  02/26/2014 DATE OF DISCHARGE:  02/27/2014  PRIMARY CARE PHYSICIAN:  Deborra Medina, MD.  ADMITTING DIAGNOSES:   1.  Transient ischemic attack. 2.  Dizziness.  DISCHARGE DIAGNOSES:  1.  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   2.  Dizziness has resolved.   SECONDARY DISCHARGE DIAGNOSES:  Hypertension.   PROCEDURES: None.   CONSULTATIONS: Neurology is consulted.  BRIEF HISTORY AND HOSPITAL COURSE: The patient is a 69 year old Caucasian female who came into the ED with a chief complaint of headache and dizziness. Please review history and physical for details. During my examination, the patient was reporting that she passed out several times prior to the admission. CT head was normal. A 2D echocardiogram was normal. The patient was persistently complaining of headache. 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 The patient was evaluated by neurology who has recommended to provide her magnesium sulfate on IV form and continue magnesium oxide 400 mg and B complex once a day on a daily basis. The patient was also evaluated by cardiology, but her telemetry has revealed no erythema. Echocardiogram also normal with a normal ejection fraction. From their perspective, they do not think it is cardiogenic syncope. With magnesium, her condition is significantly improved. Headache is resolved. Dizziness was significantly improved. The patient was not orthostatic. She was discharged home in stable condition.   ACTIVITY: As tolerated.  DIET:  Low salt.  SIGNIFICANT LABORATORY DATA AND IMAGING STUDIES: CT head: No acute intracranial abnormalities. Old left basal ganglia and lacunar infarcts and chronic microvascular ischemic changes in the cerebral white matter. A 2D echocardiogram has revealed left ventricular  ejection fraction of 60% to 65%. MRI of the brain was ordered, which was canceled.  Neurology has not recommended carotid Dopplers; however, the study was done, and results are pending.  PCP to follow up on that. The patient's troponin is less than 0.02. CBC has revealed hemoglobin 10.6 and hematocrit 33.6.  DISCHARGE MEDICATIONS:  Magnesium oxide 400 mg p.o. once daily, Bystolic 10 mg p.o. once daily in a.m., Januvia 100 mg p.o. once a day in a.m., Estradiol 0.5 mg 1 tablet p.o. once daily at bedtime. NovoLog sliding scale, ropinirole 1 mg p.o. once daily at bedtime for restless legs, tramadol 50 mg 1-2 tablets p.o. as needed for the cramps, diazepam 10 mg 1 tablet by p.o. once daily at bedtime for leg cramps, Lyrica 50 mg 1 capsule p.o. once daily at bedtime, cyanocobalamine 1 mL injectable once a monthly, Tylenol with hydrocodone 500/5 one tablet p.o. every 4-6 hours as needed, Imodium 1-3 tablets every 4-6 hours as needed for uncontrolled diarrhea, Lunesta 3 mg 1 tablet p.o. once daily, B complex 1 tablet p.o. once daily, hydralazine 25 mg p.o. 3 times a day, pantoprazole 40 mg p.o. once daily.   FOLLOWUP:  Follow up with primary care physician in 1 week, neurology in 2-4 weeks as needed.   DIET: Low sodium.   Plan of care was discussed with the patient in detail.  She was less understanding of the plan.   TOTAL TIME SPENT ON THE DISCHARGE: 45 minutes.   PCP to follow up on the carotid Doppler study, which is pending at this time.    ____________________________ Nicholes Mango, MD ag:DT D: 02/27/2014 13:44:11 ET T: 02/27/2014 20:53:31 ET JOB#: [id number]  cc: Illene Silver  Sirinity Outland, MD, <Dictator> Nicholes Mango MD ELECTRONICALLY SIGNED 03/04/2014 16:06

## 2014-11-07 NOTE — Op Note (Signed)
PATIENT NAME:  Denise Sharp, Denise Sharp MR#:  [id number] DATE OF BIRTH:  10-Oct-1945  DATE OF PROCEDURE:  10/16/2011  PREOPERATIVE DIAGNOSES:  1. Urinary frequency.  2. Urge and fecal incontinence.   POSTOPERATIVE DIAGNOSES:  1. Urinary frequency.  2. Urge and fecal incontinence.   PROCEDURE: InterStim placement.   SURGEON: Edrick Oh, M.D.   ANESTHESIA: MAC anesthesia.   INDICATIONS: The patient is a 69 year old white female with a long history of frequency, urgency, and urge component incontinence. She also has significant fecal incontinence. She has been tried on medical management, in addition to percutaneous tibial nerve stimulation. She has also tried conservative measures. All have provided no significant benefit, except for the percutaneous tibial nerve stimulation. She presents for InterStim placement for definitive treatment. She has undergone a percutaneous InterStim test with excellent response of both fecal and urge component incontinence. She presents today for implantation.   DESCRIPTION OF PROCEDURE: After informed consent was obtained, the patient was taken to the operating room and placed in the prone position under MAC anesthesia. The patient was then prepped and draped in the usual standard fashion. The site from the previous temporary lead was visualized on the overlying skin. The approximate site of the third sacral foramen was identified on the overlying skin. A needle was placed at this area, after anesthetizing the skin with 1% lidocaine. The needle was inserted easily into the third sacral foramen. Stimulation demonstrated good bellows. The obturator was placed through the needle. A small skin incision was made. The sheath was then placed over the obturator. The central obturator was then removed from the sheath. The lead was passed through the sheath into proper position. The sheath was then pulled back to first position. Stimulation demonstrated good response on the second  lead. Slight response was noted on the third lead. Some repositioning of the lead was encountered with no stimulation on zero, minimal one, good two, and partial three. Due to the patient's positioning, the decision was made to place the generator pocket on the left, even though the lead is on the right. An approximate site of the generator pocket was marked on the overlying skin. The skin was anesthetized utilizing 1% lidocaine. A small skin incision and subcutaneous pocket was then developed. The pocket was then irrigated with ample amounts of antibiotic irrigating solution. The tunneler was then utilized to pass the lead from the midline site to the generator pocket. This was performed without difficulty. The generator was connected to the lead in standard fashion. The generator was then placed into the subcutaneous pocket. A test was then performed demonstrating good parameters throughout. The subcutaneous pocket was closed in two layers utilizing first a 2-0 Vicryl suture and then subsequently 4-0 Vicryl subcuticular stitch. The midline site was closed utilizing a 4-0 Vicryl subcutaneous suture. Steri-Strips, Telfa, and Tegaderm dressing was then applied. The patient was then returned to the supine position. She was taken to the recovery room in stable condition. There were no problems or complications. The patient tolerated the procedure well.  ____________________________ [name], MD bsc:slb D: 10/16/2011 14:30:06 ET T: 10/16/2011 15:29:19 ET JOB#: [id number]  cc: [name], MD, <Dictator> [name] Vaiden Adames MD ELECTRONICALLY SIGNED 10/18/2011 13:11

## 2014-11-07 NOTE — Op Note (Signed)
PATIENT NAME:  Denise, Sharp MR#:  [id number] DATE OF BIRTH:  1945/11/07  DATE OF PROCEDURE:  10/02/2011  PREOPERATIVE DIAGNOSIS: Urinary frequency, urgency, and urge incontinence.   POSTOPERATIVE DIAGNOSIS: Urinary frequency, urgency, and urge incontinence.   PROCEDURE: InterStim test.   SURGEON: Edrick Oh, M.D.   ANESTHESIA: Local with IV sedation.   INDICATIONS: The patient is a 69 year old white female with a long history of progressive urgency, frequency, and urge component incontinence. She has been tried on multiple medical managements including conservative measures without success. She has undergone tibial nerve stimulation with excellent response. She presents for InterStim percutaneous test.   PROCEDURE: After informed consent was obtained, the patient was taken to the Operating Room and placed in the prone position on the operating table. She was prepped and draped in the usual standard fashion. She was placed under sedation by anesthesia. The midline sacrum was identified utilizing fluoroscopy. The approximate site of the third sacral foramen was also identified on the overlying skin. The mid aspect of the right and left foramen were also noted on the overlying skin. The skin was anesthetized utilizing 1% lidocaine. Additional lidocaine was instilled around the opening of the sacral foramen of 2 and 3. The sacral spinal needle was first advanced on the right. It was advanced in through a sacral foramen after several adjustments. Stimulation provided no great toe movement. Slight bellows was identified. No leg rotation was noted. On lateral view it was unclear whether this was indeed the third or fourth sacral foramen. The decision was made to place the needle one foramen higher. This did demonstrate bellows. However, significant gluteal movement was also achieved. With manipulation of the needle calf rotation was also identified. This confirmed the presence of the second sacral  foramen. The needle was replaced back into the third sacral foramen at a different level. Stimulation, however, was still not achieved as desired. She was feeling some stimulation in the vaginal region. The needle was placed in a similar location on the right. Once again, inadequate response was noted. The needle was placed one foramen higher with leg rotation also noted. This confirmed the presence of the second foramen. The third sacral foramen was reintubated. Multiple passes of the needle were required for adequate stimulation with subsequent toe movement and bellows. The lead was placed with good response. The right lead was repositioned several times with subsequent good response. After placement of the lead, however, the response was not achieved. This required replacement of the lead once again for proper stimulation. The subsequent replacement did demonstrate good response. The leads were connected to the overlying skin. There were connected in standard fashion. A Tegaderm and tape were placed over the area. The right lead was secured to the dressing. The left lead was left free for initiation of stimulation. The patient was returned to the supine position. She was then taken to the recovery room in stable condition. There were no problems or complications. The patient tolerated the procedure well.     ____________________________ [name], MD bsc:bjt D: 10/02/2011 16:49:21 ET T: 10/02/2011 17:19:29 ET JOB#: [id number]  cc: [name], MD, <Dictator> [name] ELECTRONICALLY SIGNED 10/05/2011 7:56

## 2014-11-08 ENCOUNTER — Encounter: Payer: Self-pay | Admitting: Internal Medicine

## 2014-11-08 ENCOUNTER — Telehealth: Payer: Self-pay | Admitting: Internal Medicine

## 2014-11-08 ENCOUNTER — Ambulatory Visit (INDEPENDENT_AMBULATORY_CARE_PROVIDER_SITE_OTHER): Payer: Medicare Other | Admitting: Internal Medicine

## 2014-11-08 VITALS — BP 120/72 | HR 86 | Temp 98.8°F | Resp 14 | Ht 64.0 in | Wt 242.0 lb

## 2014-11-08 DIAGNOSIS — E1142 Type 2 diabetes mellitus with diabetic polyneuropathy: Secondary | ICD-10-CM | POA: Diagnosis not present

## 2014-11-08 DIAGNOSIS — E662 Morbid (severe) obesity with alveolar hypoventilation: Secondary | ICD-10-CM | POA: Diagnosis not present

## 2014-11-08 DIAGNOSIS — I10 Essential (primary) hypertension: Secondary | ICD-10-CM

## 2014-11-08 DIAGNOSIS — G629 Polyneuropathy, unspecified: Secondary | ICD-10-CM | POA: Diagnosis not present

## 2014-11-08 MED ORDER — BLOOD PRESSURE MONITOR/ARM DEVI: Status: DC

## 2014-11-08 NOTE — Patient Instructions (Addendum)
Your blood pressure readings may be inaccurate if you are using the wrist cuff or the wrong szie arm cuff.   I would not make any changes in your medication based on those readings   I am referring you back to Dr Melvyn Novas to treat your obesity hypoventilation syndrome.  He may end up repeating your sleep study    [facility]

## 2014-11-08 NOTE — Progress Notes (Signed)
Patient ID: Denise Sharp, female   DOB: 21-Jan-1946, 69 y.o.   MRN: [id number]   Patient Active Problem List   Diagnosis Date Noted  . Hypothyroidism 10/31/2014  . Candida rash of groin 10/31/2014  . Obesity hypoventilation syndrome 08/13/2014  . Frequent falls 06/23/2014  . Chronic cough 05/05/2014  . History of adrenal adenoma 03/24/2014  . Cervicogenic headache 03/23/2014  . Syncope and collapse 03/12/2014  . Unspecified vitamin D deficiency 01/06/2014  . Abdominal pain, acute, right lower quadrant 12/03/2013  . Rectal bleeding 12/03/2013  . Weight gain 12/03/2013  . Headache(784.0) 11/23/2013  . Obesity 08/25/2013  . Abdominal discomfort in right lower quadrant 08/04/2013  . Diabetic polyneuropathy 03/25/2013  . Abnormality of gait 03/25/2013  . Multiple pulmonary nodules 03/20/2013  . SOB (shortness of breath) 03/20/2013  . Anemia 08/06/2012  . Iron deficiency anemia 08/06/2012  . OSA on CPAP 03/02/2012  . Insomnia secondary to anxiety 02/29/2012  . Bundle branch block, left 01/07/2012  . Sciatica 09/05/2011  . Cervical stenosis of spinal canal 06/14/2011  . Adrenal mass 03/13/2011  . Restless legs syndrome 03/13/2011  . Degenerative disk disease 03/13/2011  . Hypertension 03/12/2011  . DM type 2 with diabetic peripheral neuropathy 03/12/2011  . Hearing loss, right 03/12/2011    Subjective:  CC:   Chief Complaint  Patient presents with  . Hypotension    88/51 at 9 pm on 11/07/14 HR 68 NO BP meds today or DM medications.    HPI:   Denise Sharp is a 69 y.o. female who presents for   Recurrent low blood pressures.  Patient reports that  while [place],  Her BP was measured with a wrist cuff by her friend, who is a CNA,  And it was 88/43 .  Her husband rechecked it using his extra large arm cuff (which was too large so he had to gather it up in his hand) and it was also low.  Patient is constantly feeling fatigued and reprots  frequent syncope, so she was worked in today.  Her past syncopal episodes have actually been clarified; she  falls asleep during normal passive activities due to OHS.  She has been gaining weight progressively despite reports of minimal food intake,  And she is too tired to exercise at all.   Last sleep study was several years ago.  Has not seen Wert in over [place] a year.  Blood pressure measurements using appropriately sized arm cuff are normal. She has not used her own machine because it is not within reach and may be outdated.       Past Medical History  Diagnosis Date  . Diabetes mellitus   . Depression   . Hypertension   . Cervical spinal stenosis 1994    due to trauma to back ([employer] accident), has intermittent paralysis and parasthesias  . Adrenal mass   . IBS (irritable bowel syndrome)   . Obstructive sleep apnea     using CPAP  . Anemia     iron deficient post  2 unit txfsn 2009, normal endo/colonoscopy by [facility]  . Esophageal stenosis March 2011    with transietn outlet obstruction by food, cleared by EGD   . LBBB (left bundle branch block)   . Obesity   . Apnea   . Headache(784.0)   . Arthritis   . Restless leg syndrome   . Dizziness     chronic dizziness  . Adrenal mass, left   .  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 Abnormality of gait 03/25/2013  . Syncope and collapse 03/12/2014  . Cervicogenic headache 03/23/2014    Past Surgical History  Procedure Laterality Date  . Gastric bypass    . Abdominal hysterectomy    . Gastric bypass  2000, 2005    Dr. Debroah Loop  . Appendectomy    . Gallbladder surgery  1976  . Rotator cuff repair      right  . Joint replacement  2007    bilateral knee. [name],  [name]  . Spine surgery  1995    [place]  . Interstim implant placement  April 2013    [facility]  . Gallbladder surgery  resection  . Total abdominal hysterectomy w/ bilateral salpingoophorectomy  1974       The following portions of the patient's history  were reviewed and updated as appropriate: Allergies, current medications, and problem list.    Review of Systems:   Patient denies headache, fevers, malaise, unintentional weight loss, skin rash, eye pain, sinus congestion and sinus pain, sore throat, dysphagia,  hemoptysis , cough, dyspnea, wheezing, chest pain, palpitations, orthopnea, edema, abdominal pain, nausea, melena, diarrhea, constipation, flank pain, dysuria, hematuria, urinary  Frequency, nocturia, numbness, tingling, seizures,  Focal weakness, Loss of consciousness,  Tremor, insomnia, depression, anxiety, and suicidal ideation.     History   Social History  . Marital Status: Married    Spouse Name: Denise Sharp   . Number of Children: 2  . Years of Education: College    Occupational History  .     Social History Main Topics  . Smoking status: Never Smoker   . Smokeless tobacco: Never Used  . Alcohol Use: No  . Drug Use: No  . Sexual Activity: Not Currently   Other Topics Concern  . Not on file   Social History Narrative   Patient lives at home with husband Denise Sharp.    Patient has 2 children.    Patient has [employer].    Patient is on disability.    Patient is right handed.     Objective:  Filed Vitals:   11/08/14 1812  BP: 120/72  Pulse: 86  Temp: 98.8 F (37.1 C)  Resp: 14     General appearance: alert, lethargicwhen not speaking, [place].  Ears: normal TM's and external ear canals both ears Throat: lips, mucosa, and tongue normal; teeth and gums normal Neck: no adenopathy, no carotid bruit, supple, symmetrical, trachea midline and thyroid not enlarged, symmetric, no tenderness/mass/nodules Back: symmetric, no curvature. ROM normal. No CVA tenderness. Lungs: clear to auscultation bilaterally Heart: regular rate and rhythm, S1, S2 normal, no murmur, click, rub or gallop Abdomen: soft, non-tender; bowel sounds normal; no masses,  no organomegaly Pulses: 2+ and symmetric Skin: Skin color,  texture, turgor normal. No rashes or lesions Lymph nodes: Cervical, supraclavicular, and axillary nodes normal. Psych: affect normal, makes good eye contact. No fidgeting,  Smiles easily.  Answers appropriately   Assessment and Plan:  Hypertension Reassurance provided that her Bps are not likely accurate if not done with an  appropriately fitted arm cuff.  No changes made to medication,  new home BP machine ordered per patient request.    Obesity hypoventilation syndrome Patient might benefit from [place], but given her multiple comorbidities,  Will have her see Dr Melvyn Novas  To confirm [facility]    DM type 2 with diabetic peripheral neuropathy Previously reported hyperglycemia has improved with regimen modification ,  No changes today  Lab Results  Component Value Date   HGBA1C 6.8* 08/13/2014       Updated Medication List Outpatient Encounter Prescriptions as of 11/08/2014  Medication Sig  . albuterol (PROVENTIL HFA;VENTOLIN HFA) 108 (90 BASE) MCG/ACT inhaler Inhale 2 puffs into the lungs every 6 (six) hours as needed for wheezing or shortness of breath.  . benzonatate (TESSALON) 200 MG capsule Take 1 capsule (200 mg total) by mouth 3 (three) times daily as needed for cough.  . chlorpheniramine-HYDROcodone (TUSSIONEX PENNKINETIC ER) 10-8 MG/5ML LQCR Take 5 mLs by mouth at bedtime as needed for cough.  . Cholecalciferol (VITAMIN D3) 10000 UNITS capsule Take 10,000 Units by mouth once a week.  . cyanocobalamin (,VITAMIN B-12,) 1000 MCG/ML injection 1000 ml (1 ml) injection twice monthly  . diazepam (VALIUM) 10 MG tablet Take 1 tablet (10 mg total) by mouth at bedtime as needed for anxiety.  [name] estradiol (ESTRACE) 0.5 MG tablet Take 1 tablet (0.5 mg total) by mouth daily.  . eszopiclone (LUNESTA) 2 MG TABS tablet Take 1 tablet (2 mg total) by mouth at bedtime. Take immediately before bedtime  . fluticasone (FLONASE) 50 MCG/ACT nasal spray Place 2 sprays into both nostrils daily.  . furosemide  (LASIX) 20 MG tablet Take 1 tablet (20 mg total) by mouth daily.  [name] glimepiride (AMARYL) 2 MG tablet Take 1 tablet (2 mg total) by mouth daily with breakfast.  . Glucose Blood (BAYER BREEZE 2 TEST) DISK Use one test strip each time glucose needs testing.  . hydrALAZINE (APRESOLINE) 25 MG tablet Take 1 tablet (25 mg total) by mouth 3 (three) times daily. As needed for bp > 160  . hydrocodone-acetaminophen (LORCET-HD) 5-500 MG per capsule Take 1 capsule by mouth every 6 (six) hours as needed.  [name] levothyroxine (SYNTHROID, LEVOTHROID) 50 MCG tablet Take 1 tablet (50 mcg total) by mouth daily.  [name] losartan (COZAAR) 50 MG tablet   . nebivolol (BYSTOLIC) 5 MG tablet Take 1 tablet (5 mg total) by mouth daily.  [name] nystatin (MYCOSTATIN) powder Apply topically 2 (two) times daily.  [name] oxyCODONE-acetaminophen (PERCOCET/ROXICET) 5-325 MG per tablet Take 1 tablet by mouth every 6 (six) hours as needed.  . pregabalin (LYRICA) 50 MG capsule Take 1 tablet in the morning and 2 tablets in the evening.  [name] rOPINIRole (REQUIP) 1 MG tablet Take 1 tablet (1 mg total) by mouth at bedtime.  . sitaGLIPtin (JANUVIA) 100 MG tablet Take 1 tablet (100 mg total) by mouth daily.  . traMADol (ULTRAM) 50 MG tablet Take 50 mg by mouth 4 (four) times daily as needed.  . Blood Pressure Monitoring (BLOOD PRESSURE MONITOR/ARM) DEVI Automatic BP machine.  Normal adult size arm     Orders Placed This Encounter  Procedures  . Ambulatory referral to Pulmonology    No Follow-up on file.

## 2014-11-08 NOTE — Telephone Encounter (Signed)
Talked with Megan at [facility], patient reported a BP 88/30 @ Midnight and patient stated today that her BP 112/58 , called patient 100/67 pulse 71, complaining of feeling dizziness and weakness. Patient reported at phone call BP 88/51 HR 68 last night not 88/30 patient was scheduled for OV at 6 PM ok

## 2014-11-08 NOTE — Telephone Encounter (Signed)
[facility]  Patient Name: Denise Sharp  DOB: 07-Mar-1946    Initial Comment Caller states she had low bp last week. Last night was 88/38. Today bp is 112/58, having trouble staying awake on the phone.   Nurse Assessment  Nurse: Kenton Kingfisher, RN, [place] Date/Time (Eastern Time): 11/08/2014 10:30:48 AM  Confirm and document reason for call. If symptomatic, describe symptoms. ---Caller states she had low blood pressure last week. Last night was 88/38. Today blood pressure is 112/68. Caller states she takes BP medication Lorsatan/Potassium and Bystolic, Has hydrochlorothiazide if its over 160. Caller states she is having trouble staying awake she can stand but is extremely staggering. Caller states balance is off. Caller states she is starting a 24 hour urine today that she is to take to [name] tomorrow. Caller also states she had surgery 2 weeks ago and had to change the battery on her neurostimulator in her back and follow ups with MD tomorrow regarding that.  Has the patient traveled out of the country within the last 30 days? ---No  Does the patient require triage? ---Yes  Related visit to physician within the last 2 weeks? ---No  Does the PT have any chronic conditions? (i.e. diabetes, asthma, etc.) ---Yes  List chronic conditions. ---Hypertension No heart problems has neurostimulator due to no bladder control Diabetic on Thyroid medication     Guidelines    Guideline Title Affirmed Question Affirmed Notes  Weakness (Generalized) and Fatigue [1] MODERATE weakness (i.e., interferes with work, school, normal activities) AND [2] cause unknown (Exceptions: weakness with acute minor illness, or weakness from poor fluid intake)    Final Disposition User   See Physician within 4 Hours (or PCP triage) Kenton Kingfisher, RN, [place]    Comments  [phone] Called back line to speak with office transferred to [facility]  the nurse to [name] message left to have her call me back.

## 2014-11-08 NOTE — Telephone Encounter (Signed)
Patient needing a call back from the nurse.  She has a new computer and she is needing to know if there are any new my chart messages.

## 2014-11-08 NOTE — Progress Notes (Signed)
Pre-visit discussion using our clinic review tool. No additional management support is needed unless otherwise documented below in the visit note.  

## 2014-11-09 ENCOUNTER — Other Ambulatory Visit: Payer: Self-pay | Admitting: *Deleted

## 2014-11-09 ENCOUNTER — Encounter: Payer: Self-pay | Admitting: Internal Medicine

## 2014-11-09 DIAGNOSIS — Z09 Encounter for follow-up examination after completed treatment for conditions other than malignant neoplasm: Secondary | ICD-10-CM | POA: Diagnosis not present

## 2014-11-09 DIAGNOSIS — N3941 Urge incontinence: Secondary | ICD-10-CM | POA: Diagnosis not present

## 2014-11-09 DIAGNOSIS — R350 Frequency of micturition: Secondary | ICD-10-CM | POA: Diagnosis not present

## 2014-11-09 DIAGNOSIS — N319 Neuromuscular dysfunction of bladder, unspecified: Secondary | ICD-10-CM | POA: Diagnosis not present

## 2014-11-09 DIAGNOSIS — E279 Disorder of adrenal gland, unspecified: Secondary | ICD-10-CM | POA: Diagnosis not present

## 2014-11-09 DIAGNOSIS — E278 Other specified disorders of adrenal gland: Secondary | ICD-10-CM

## 2014-11-09 NOTE — Assessment & Plan Note (Addendum)
Previously reported hyperglycemia has improved with regimen modification ,  No changes today  Lab Results  Component Value Date   HGBA1C 6.8* 08/13/2014

## 2014-11-09 NOTE — Assessment & Plan Note (Signed)
Reassurance provided that her Bps are not likely accurate if not done with an  appropriately fitted arm cuff.  No changes made to medication,  new home BP machine ordered per patient request.

## 2014-11-09 NOTE — Assessment & Plan Note (Signed)
Patient might benefit from [place], but given her multiple comorbidities,  Will have her see Dr Melvyn Novas  To confirm [facility]

## 2014-11-10 LAB — CREATININE, URINE, 24 HOUR
Creatinine, 24H Ur: 1093 mg/d (ref 700–1800)
Creatinine, Urine: 68.3 mg/dL

## 2014-11-11 ENCOUNTER — Other Ambulatory Visit: Payer: Self-pay | Admitting: *Deleted

## 2014-11-11 ENCOUNTER — Telehealth: Payer: Self-pay | Admitting: *Deleted

## 2014-11-11 DIAGNOSIS — R358 Other polyuria: Secondary | ICD-10-CM

## 2014-11-11 DIAGNOSIS — R3589 Other polyuria: Secondary | ICD-10-CM

## 2014-11-11 NOTE — Telephone Encounter (Signed)
Ordered,  They will be available through Care everywhere if they do not receive them from office

## 2014-11-11 NOTE — Telephone Encounter (Signed)
Spoke with pt, advised order placed for ua.  Pt verbalized understanding

## 2014-11-11 NOTE — Telephone Encounter (Signed)
Tammy from Dr Copes office called states pt was seen on 4.26.16 and was not able to provide a UA to his office.  He is requesting Dr [name] to order a UA and send him the results so pt would not have to come back to [facility] just to provide one.  Please advise order

## 2014-11-12 LAB — URINALYSIS, ROUTINE W REFLEX MICROSCOPIC
Bilirubin, UA: NEGATIVE
Glucose, UA: NEGATIVE
Ketones, UA: NEGATIVE
Leukocytes, UA: NEGATIVE
Nitrite, UA: NEGATIVE
Protein, UA: NEGATIVE
RBC, UA: NEGATIVE
Specific Gravity, UA: 1.016 (ref 1.005–1.030)
Urobilinogen, Ur: 0.2 mg/dL (ref 0.2–1.0)
pH, UA: 5 (ref 5.0–7.5)

## 2014-11-12 LAB — CATECHOLAMINES, FRACTIONATED, URINE, 24 HOUR
Calculated Total (E+NE): 18 mcg/24 h — ABNORMAL LOW (ref 26–121)
Creatinine, Urine mg/day-CATEUR: 0.89 g/(24.h) (ref 0.63–2.50)
Dopamine, 24 hr Urine: 99 mcg/24 h (ref 52–480)
Norepinephrine, 24 hr Ur: 18 mcg/24 h (ref 15–100)
Total Volume - CF 24Hr U: 1600 mL

## 2014-11-13 LAB — METANEPHRINES, URINE, 24 HOUR
Metaneph Total, Ur: 352 mcg/24 h (ref 224–832)
Metanephrines, Ur: 71 mcg/24 h — ABNORMAL LOW (ref 90–315)
Normetanephrine, 24H Ur: 281 mcg/24 h (ref 122–676)

## 2014-11-14 LAB — CORTISOL, URINE, 24 HOUR
Cortisol (Ur), Free: 35.8 mcg/24 h (ref 4.0–50.0)
RESULTS RECEIVED: 1.13 g/(24.h) (ref 0.63–2.50)

## 2014-11-16 ENCOUNTER — Encounter: Payer: Self-pay | Admitting: *Deleted

## 2014-11-17 ENCOUNTER — Other Ambulatory Visit: Payer: Self-pay | Admitting: Endocrinology

## 2014-11-17 DIAGNOSIS — E278 Other specified disorders of adrenal gland: Secondary | ICD-10-CM

## 2014-11-22 ENCOUNTER — Ambulatory Visit (INDEPENDENT_AMBULATORY_CARE_PROVIDER_SITE_OTHER): Payer: Medicare Other | Admitting: Podiatry

## 2014-11-22 DIAGNOSIS — M79676 Pain in unspecified toe(s): Secondary | ICD-10-CM | POA: Diagnosis not present

## 2014-11-22 DIAGNOSIS — B351 Tinea unguium: Secondary | ICD-10-CM | POA: Diagnosis not present

## 2014-11-22 NOTE — Progress Notes (Signed)
She presents today with chief complaint of painful elongated toenails.  Objective: Vital signs are stable she is alert and oriented 3. Pulses are strong and palpable bilateral nails are thick yellow dystrophic onychomycotic bilateral. No open ulcer.  Assessment: Pain in limb secondary to onychomycosis 1 through 5 bilateral.  Plan: Debridement of nails 1 through 5 bilateral.

## 2014-11-24 DIAGNOSIS — R350 Frequency of micturition: Secondary | ICD-10-CM | POA: Diagnosis not present

## 2014-11-30 ENCOUNTER — Telehealth: Payer: Self-pay | Admitting: Internal Medicine

## 2014-11-30 NOTE — Telephone Encounter (Signed)
Patient called checking on script for BP cuff advised patient sent to pharmacy as requested 11/08/14

## 2014-12-02 ENCOUNTER — Other Ambulatory Visit: Payer: Self-pay | Admitting: Endocrinology

## 2014-12-02 DIAGNOSIS — E278 Other specified disorders of adrenal gland: Secondary | ICD-10-CM

## 2014-12-03 ENCOUNTER — Ambulatory Visit
Admission: RE | Admit: 2014-12-03 | Discharge: 2014-12-03 | Disposition: A | Discharge status: Home / Self Care | Payer: Medicare Other | Source: Ambulatory Visit | Attending: Endocrinology | Admitting: Endocrinology

## 2014-12-03 DIAGNOSIS — K429 Umbilical hernia without obstruction or gangrene: Secondary | ICD-10-CM | POA: Diagnosis not present

## 2014-12-03 DIAGNOSIS — Z9884 Bariatric surgery status: Secondary | ICD-10-CM | POA: Insufficient documentation

## 2014-12-03 DIAGNOSIS — D3502 Benign neoplasm of left adrenal gland: Secondary | ICD-10-CM | POA: Diagnosis not present

## 2014-12-03 DIAGNOSIS — I2510 Atherosclerotic heart disease of native coronary artery without angina pectoris: Secondary | ICD-10-CM | POA: Diagnosis not present

## 2014-12-03 DIAGNOSIS — E278 Other specified disorders of adrenal gland: Secondary | ICD-10-CM

## 2014-12-03 DIAGNOSIS — R279 Unspecified lack of coordination: Secondary | ICD-10-CM | POA: Diagnosis present

## 2014-12-06 ENCOUNTER — Encounter: Payer: Self-pay | Admitting: Internal Medicine

## 2014-12-06 ENCOUNTER — Ambulatory Visit (INDEPENDENT_AMBULATORY_CARE_PROVIDER_SITE_OTHER): Payer: Medicare Other | Admitting: Internal Medicine

## 2014-12-06 VITALS — BP 122/78 | HR 74 | Ht 64.5 in | Wt 240.8 lb

## 2014-12-06 DIAGNOSIS — R4182 Altered mental status, unspecified: Secondary | ICD-10-CM

## 2014-12-06 DIAGNOSIS — G4733 Obstructive sleep apnea (adult) (pediatric): Secondary | ICD-10-CM | POA: Diagnosis not present

## 2014-12-06 DIAGNOSIS — Z9989 Dependence on other enabling machines and devices: Secondary | ICD-10-CM

## 2014-12-06 NOTE — Patient Instructions (Addendum)
You are clearly having problems with sleep hygiene which is not directly related to your cpap issues and I will refer you back to Dr [name] with some recommendations for this  I do think it's time to have a cpap titration repeated and have your cpap pressure adjusted if needed (this will usually  need to be repeated with significant wt gain or wt loss and looks like it needs to be done again now by the records I reviewed today )   Dr [name] can send you to the sleep specialist of her choice though our doctors are more focused correcting the obstructive component of your problem than adjusting your sleep medications so may not be the best fit

## 2014-12-06 NOTE — Progress Notes (Signed)
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   03/20/2013 1st [facility] office visit/ Josep Luviano  Chief Complaint  Patient presents with  . Pulmonary Consult    Referred per Dr Carmelina Noun for eval of pulmonary nodule. Pt c/o SOB and CP for the past 6-12 months. Pt states that she is SOB "most of the time" but worse with exertion and when she gets in a hurry. CP comes and goes and sometimes is sharp.   one aisle at grocery store gets exhausted some shortness and sometimes cp, and sometimes no cp which is just as common supine,  First noted with wt gain, better with wt loss, then worse x one year. Cp lasts 5-10 min eval at [facility] and nl heart cath March 2014 -? pain worse eating > ? Related to prev GI surgeries so w/u in progress - does have occ vomiting rec Please remember to go to the x-ray department downstairs for your tests - we will call you with the results when they are available.  You will need a follow up CT scan in 6 months at [place] done 03/2014 and f/u planned for 03/2015     12/06/2014 new problem/ consultation/Gaudencio Chesnut re: ams/ [place] ? Is this sleep apnea?  Chief Complaint  Patient presents with  . Follow-up    Referred back by Dr [name]. Pt c/o "spells of passing out or falling asleep easily" since Jan 2016.  She states she stays tired all of the time and only sleeps about 4 hours every night. Falls asleep while sitting and wathcing tv, riding in a car.     Most recent cpap 08/2012 titration >  Set at 7 because had lost wt but gained back 50 lb and  Does not know why that happened, newly on thyroid x sev months and having up to 25 bm's a day but still gaining wt - records show wt 237 in October then started problems late Dec2015 / Early Jan 2016 feeling a lot more sleepy also with periods of decreased  responsiveness and had one spell where didn't come around and admitted to [facility] with cardiac w/u neg in Jan and Neurology w/u since then also inconclusive  Does not sleep well at hs with multiple drugs benzo's / has not tried belsomra  No obvious day to day or daytime variabilty or assoc chronic cough or cp or chest tightness, subjective wheeze overt sinus or hb symptoms. No unusual exp hx or h/o childhood pna/ asthma or knowledge of premature birth.  Sleeping  without nocturnal  or early am exacerbation  of respiratory  c/o's or need for noct saba. Also denies any obvious fluctuation of symptoms with weather or environmental changes or other aggravating or alleviating factors except as outlined above   Current Medications, Allergies, Complete Past Medical History, Past Surgical History, Family History, and Social History were reviewed in [employer] record.  ROS  The following are not active complaints unless bolded sore throat, dysphagia, dental problems, itching, sneezing,  nasal congestion or excess/ purulent secretions, ear ache,   fever, chills, sweats, unintended wt loss, pleuritic or exertional cp, hemoptysis,  orthopnea pnd or leg swelling, presyncope, palpitations, heartburn, abdominal pain, anorexia, nausea, vomiting, diarrhea  or change in bowel or urinary habits, change in  stools or urine, dysuria,hematuria,  rash, arthralgias, visual complaints, headache, numbness weakness or ataxia or problems with walking or coordination,  change in mood/affect or memory.            Objective:   Physical Exam   amb obese  wf nad  .12/06/2014       241  Wt Readings from Last 3 Encounters:  03/20/13 225 lb (102.059 kg)  01/19/13 225 lb 8 oz (102.286 kg)  10/10/12 228 lb 12 oz (103.76 kg)     HEENT: nl dentition, turbinates, and orophanx. Nl external ear canals without cough reflex   NECK :  without JVD/Nodes/TM/ nl carotid upstrokes bilaterally   LUNGS: no acc  muscle use, clear to A and P bilaterally without cough on insp or exp maneuvers   CV:  RRR  no s3 or murmur or increase in P2, no edema   ABD:  soft and nontender with nl excursion in the supine position. No bruits or organomegaly, bowel sounds nl  MS:  warm without deformities, calf tenderness, cyanosis or clubbing  SKIN: warm and dry without lesions    NEURO:  alert, approp, no deficits               Assessment & Plan:

## 2014-12-07 ENCOUNTER — Encounter: Payer: Self-pay | Admitting: Internal Medicine

## 2014-12-07 DIAGNOSIS — R4182 Altered mental status, unspecified: Secondary | ICD-10-CM | POA: Insufficient documentation

## 2014-12-07 NOTE — Assessment & Plan Note (Signed)
Variable pattern of daytime hypersomnolence since Dec 2015 assoc with minimal wt gain with no elevation of HC03 on any of recent epic labs and a chronic pattern of extremely poor sleep hygiene sleeping only 4 h per night at most despite multiple benzo's c/w development of tachyphylaxis   I had an extended discussion with the patient and husband reviewing all relevant studies completed to date and  lasting 35  minutes of a 60 minute visit on the following ongoing concerns:   1) agree with plans to repeat cpap titration and emphasized needs to be done any time she has major wt changes or worsening daytime hypersomnolence with referral to one of our sleep speciliasts if needed  2) however, is she's only sleeping 4 h per night, even if cpap is set properly, that is enough to be restorative and is the likely cause of her daytime hypersomnolence - consider trial of Belsomra per primary care and wean off benzo's (as belsomra does not work through the GABA system)   3) reviewed her most recent pfts that show neither an obstructive or sign restrictive pattern  4) wt loss is the key to longterm health but she shows extremely poor insight into this problem 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

## 2014-12-07 NOTE — Assessment & Plan Note (Signed)
Needs repeat cpap titration > f/u with sleep medicine prn (see ams a/p)

## 2014-12-29 DIAGNOSIS — E11329 Type 2 diabetes mellitus with mild nonproliferative diabetic retinopathy without macular edema: Secondary | ICD-10-CM | POA: Diagnosis not present

## 2014-12-30 ENCOUNTER — Telehealth: Payer: Self-pay | Admitting: *Deleted

## 2014-12-30 ENCOUNTER — Other Ambulatory Visit: Payer: Self-pay | Admitting: *Deleted

## 2014-12-30 DIAGNOSIS — F419 Anxiety disorder, unspecified: Secondary | ICD-10-CM

## 2014-12-30 DIAGNOSIS — F5105 Insomnia due to other mental disorder: Secondary | ICD-10-CM

## 2014-12-30 MED ORDER — DIAZEPAM 10 MG PO TABS: 10.0000 mg | ORAL_TABLET | Freq: Every evening | ORAL | Status: DC | PRN

## 2014-12-30 MED ORDER — ESZOPICLONE 2 MG PO TABS
2.0000 mg | ORAL_TABLET | Freq: Every day | ORAL | Status: DC
Start: 2014-12-30 — End: 2015-12-02

## 2014-12-31 NOTE — Telephone Encounter (Signed)
A user error has taken place.

## 2015-01-06 ENCOUNTER — Telehealth: Payer: Self-pay | Admitting: Internal Medicine

## 2015-01-06 NOTE — Telephone Encounter (Signed)
The patient called today and stated that she spoke with me last week and I informed her that her medication had been sent to [facility] ,but per the patient it was not taken care of and she has been out of medication, also [facility] was calling informing her that her medication was ready. She also informed me that she was going out of town and she needed her medication. She also stated that she came  Into the office last week for that purpose and that she told us that she was not leaving until someone sent her prescriptions to [facility] and still it was not done . She continued to elaborate on how this is not the first time this has happened and that we should be calling her routine medications into the pharmacy automatically. 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 I spoke with Lavella Lemons on last week and informed her of the patient's request . On last week Tanya spoke with [name] and [place] faxed the prescription to into [place]. I informed the patient that it was taken care when she was in the office last week after informing her that it was taken care she left the office. She informed me that she wanted her Lunesta , Valium and Losartan 50 mg . Called to [facility].

## 2015-01-06 NOTE — Telephone Encounter (Signed)
The patient called and was very upset regarding her medication.  When I picked up the phone, she did not say hello but "do not transfer me to a voice mail".  I asked her if she was upset, and she stated she had been dealing with this problem and was very upset and angry.  I transferred her call due to the escalating tone in her voice to [place].  From my desk, I could hear the yelling continuing while she was speaking to [place].

## 2015-01-06 NOTE — Telephone Encounter (Signed)
Patient called to enquire as to when medication was refilled, patient was advised by nurse that Valium and Lunesta was faxed to [facility]. On 12/30/14. Losartan had not been filled by this office since 2014, and last visit patient was seen for hypotension.

## 2015-01-06 NOTE — Telephone Encounter (Signed)
Denise Sharp, please tell me what medication patient was requesting and what her specific comments to you were during your conversation

## 2015-01-07 NOTE — Telephone Encounter (Signed)
When the patient initially called to the office she stated her name, DOB and that the office has yet again managed to messed up her prescription again. Pt stated that it is not rocket science to refill a prescription. Pt was transferred to triage.msn

## 2015-01-11 ENCOUNTER — Ambulatory Visit: Payer: Medicare Other | Admitting: Neurology

## 2015-01-18 ENCOUNTER — Emergency Department
Admission: EM | Admit: 2015-01-18 | Discharge: 2015-01-18 | Disposition: A | Discharge status: Home / Self Care | Payer: Medicare Other | Attending: Emergency Medicine | Admitting: Emergency Medicine

## 2015-01-18 ENCOUNTER — Encounter: Payer: Self-pay | Admitting: Emergency Medicine

## 2015-01-18 DIAGNOSIS — Z7951 Long term (current) use of inhaled steroids: Secondary | ICD-10-CM | POA: Diagnosis not present

## 2015-01-18 DIAGNOSIS — Z880 Allergy status to penicillin: Secondary | ICD-10-CM | POA: Diagnosis not present

## 2015-01-18 DIAGNOSIS — Z793 Long term (current) use of hormonal contraceptives: Secondary | ICD-10-CM | POA: Insufficient documentation

## 2015-01-18 DIAGNOSIS — R21 Rash and other nonspecific skin eruption: Secondary | ICD-10-CM | POA: Diagnosis not present

## 2015-01-18 DIAGNOSIS — E1140 Type 2 diabetes mellitus with diabetic neuropathy, unspecified: Secondary | ICD-10-CM | POA: Diagnosis not present

## 2015-01-18 DIAGNOSIS — E1142 Type 2 diabetes mellitus with diabetic polyneuropathy: Secondary | ICD-10-CM | POA: Diagnosis not present

## 2015-01-18 DIAGNOSIS — I10 Essential (primary) hypertension: Secondary | ICD-10-CM | POA: Insufficient documentation

## 2015-01-18 DIAGNOSIS — Z79899 Other long term (current) drug therapy: Secondary | ICD-10-CM | POA: Diagnosis not present

## 2015-01-18 MED ORDER — VALACYCLOVIR HCL 500 MG PO TABS
1000.0000 mg | ORAL_TABLET | Freq: Every day | ORAL | Status: DC
  Administered 2015-01-18: 1000 mg via ORAL

## 2015-01-18 MED ORDER — VALACYCLOVIR HCL 500 MG PO TABS
ORAL_TABLET | ORAL | Status: AC
  Administered 2015-01-18: 1000 mg via ORAL
  Filled 2015-01-18: qty 2

## 2015-01-18 MED ORDER — DIPHENHYDRAMINE HCL 50 MG PO CAPS
ORAL_CAPSULE | ORAL | Status: AC
  Administered 2015-01-18: 50 mg via ORAL
  Filled 2015-01-18: qty 1

## 2015-01-18 MED ORDER — DIPHENHYDRAMINE HCL 50 MG PO CAPS
50.0000 mg | ORAL_CAPSULE | Freq: Once | ORAL | Status: AC
  Administered 2015-01-18: 50 mg via ORAL

## 2015-01-18 MED ORDER — VALACYCLOVIR HCL 1 G PO TABS: 1000.0000 mg | ORAL_TABLET | Freq: Three times a day (TID) | ORAL | Status: AC

## 2015-01-18 NOTE — Discharge Instructions (Signed)
Rash A rash is a change in the color or texture of your skin. There are many different types of rashes. You may have other problems that accompany your rash. CAUSES   Infections.  Allergic reactions. This can include allergies to pets or foods.  Certain medicines.  Exposure to certain chemicals, soaps, or cosmetics.  Heat.  Exposure to poisonous plants.  Tumors, both cancerous and noncancerous. SYMPTOMS   Redness.  Scaly skin.  Itchy skin.  Dry or cracked skin.  Bumps.  Blisters.  Pain. DIAGNOSIS  Your caregiver may do a physical exam to determine what type of rash you have. A skin sample (biopsy) may be taken and examined under a microscope. TREATMENT  Treatment depends on the type of rash you have. Your caregiver may prescribe certain medicines. For serious conditions, you may need to see a skin doctor (dermatologist). HOME CARE INSTRUCTIONS   Avoid the substance that caused your rash.  Do not scratch your rash. This can cause infection.  You may take cool baths to help stop itching.  Only take over-the-counter or prescription medicines as directed by your caregiver.  Keep all follow-up appointments as directed by your caregiver. SEEK IMMEDIATE MEDICAL CARE IF:  You have increasing pain, swelling, or redness.  You have a fever.  You have new or severe symptoms.  You have body aches, diarrhea, or vomiting.  Your rash is not better after 3 days. MAKE SURE YOU:  Understand these instructions.  Will watch your condition.  Will get help right away if you are not doing well or get worse. Document Released: 06/22/2002 Document Revised: 09/24/2011 Document Reviewed: 04/16/2011 ExitCare Patient Information 2015 [employer]. This information is not intended to replace advice given to you by your health care provider. Make sure you discuss any questions you have with your health care provider.  

## 2015-01-18 NOTE — ED Provider Notes (Signed)
[facility] Emergency Department Provider Note  ____________________________________________  Time seen: 4:00 AM  I have reviewed the triage vital signs and the nursing notes.   HISTORY  Chief Complaint Skin Problem     HPI Denise Sharp is a 69 y.o. female presents with left facial burning along cheekbone. Patient also admits to some redness to the face. Patient admits to pain with light touch. Patient states her husband applied a flea medication to her daughter early in the afternoon at 1 PM.     Past Medical History  Diagnosis Date  . Diabetes mellitus   . Depression   . Hypertension   . Cervical spinal stenosis 1994    due to trauma to back ([employer] accident), has intermittent paralysis and parasthesias  . Adrenal mass   . IBS (irritable bowel syndrome)   . Obstructive sleep apnea     using CPAP  . Anemia     iron deficient post  2 unit txfsn 2009, normal endo/colonoscopy by [facility]  . Esophageal stenosis March 2011    with transietn outlet obstruction by food, cleared by EGD   . LBBB (left bundle branch block)   . Obesity   . Apnea   . Headache(784.0)   . Arthritis   . Restless leg syndrome   . Dizziness     chronic dizziness  . Adrenal mass, left   . 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 Abnormality of gait 03/25/2013  . Syncope and collapse 03/12/2014  . Cervicogenic headache 03/23/2014    Patient Active Problem List   Diagnosis Date Noted  . Altered mental status 12/07/2014  . Hypothyroidism 10/31/2014  . Candida rash of groin 10/31/2014  . Obesity hypoventilation syndrome 08/13/2014  . Frequent falls 06/23/2014  . Chronic cough 05/05/2014  . History of adrenal adenoma 03/24/2014  . Cervicogenic headache 03/23/2014  . Syncope and collapse 03/12/2014  . Unspecified vitamin D deficiency 01/06/2014  . Abdominal pain, acute, right lower quadrant 12/03/2013  . Rectal bleeding 12/03/2013  . Weight gain 12/03/2013  .  Headache(784.0) 11/23/2013  . Obesity 08/25/2013  . Abdominal discomfort in right lower quadrant 08/04/2013  . Diabetic polyneuropathy 03/25/2013  . Abnormality of gait 03/25/2013  . Multiple pulmonary nodules 03/20/2013  . SOB (shortness of breath) 03/20/2013  . Anemia 08/06/2012  . Iron deficiency anemia 08/06/2012  . OSA on CPAP 03/02/2012  . Insomnia secondary to anxiety 02/29/2012  . Bundle branch block, left 01/07/2012  . Sciatica 09/05/2011  . Cervical stenosis of spinal canal 06/14/2011  . Adrenal mass 03/13/2011  . Restless legs syndrome 03/13/2011  . Degenerative disk disease 03/13/2011  . Hypertension 03/12/2011  . DM type 2 with diabetic peripheral neuropathy 03/12/2011  . Hearing loss, right 03/12/2011    Past Surgical History  Procedure Laterality Date  . Gastric bypass    . Abdominal hysterectomy    . Gastric bypass  2000, 2005    Dr. Debroah Loop  . Appendectomy    . Gallbladder surgery  1976  . Rotator cuff repair      right  . Joint replacement  2007    bilateral knee. [name],  [name]  . Spine surgery  1995    [place]  . Interstim implant placement  April 2013    [place]  . Gallbladder surgery  resection  . Total abdominal hysterectomy w/ bilateral salpingoophorectomy  1974    Current Outpatient Rx  Name  Route  Sig  Dispense  Refill  . albuterol (PROVENTIL  HFA;VENTOLIN HFA) 108 (90 BASE) MCG/ACT inhaler   Inhalation   Inhale 2 puffs into the lungs every 6 (six) hours as needed for wheezing or shortness of breath.   2 Inhaler   3   . benzonatate (TESSALON) 200 MG capsule   Oral   Take 1 capsule (200 mg total) by mouth 3 (three) times daily as needed for cough.   180 capsule   1   . Blood Pressure Monitoring (BLOOD PRESSURE MONITOR/ARM) DEVI      Automatic BP machine.  Normal adult size arm   1 Device   0   . chlorpheniramine-HYDROcodone (TUSSIONEX PENNKINETIC ER) 10-8 MG/5ML LQCR   Oral   Take 5 mLs by mouth at bedtime as needed for cough.    200 mL   0   . Cholecalciferol (VITAMIN D3) 10000 UNITS capsule   Oral   Take 10,000 Units by mouth once a week.         . cyanocobalamin (,VITAMIN B-12,) 1000 MCG/ML injection      1000 ml (1 ml) injection twice monthly   25 mL   3   . diazepam (VALIUM) 10 MG tablet   Oral   Take 1 tablet (10 mg total) by mouth at bedtime as needed for anxiety.   90 tablet   1   . estradiol (ESTRACE) 0.5 MG tablet   Oral   Take 1 tablet (0.5 mg total) by mouth daily.   90 tablet   2   . eszopiclone (LUNESTA) 2 MG TABS tablet   Oral   Take 1 tablet (2 mg total) by mouth at bedtime. Take immediately before bedtime   90 tablet   1     Dispense as written.    NAME BRAND ONLY.  PATIENT FAILED GENERIC TRIAL   . fluticasone (FLONASE) 50 MCG/ACT nasal spray   Each Nare   Place 2 sprays into both nostrils daily.   16 g   6   . furosemide (LASIX) 20 MG tablet   Oral   Take 1 tablet (20 mg total) by mouth daily.   90 tablet   3   . glimepiride (AMARYL) 2 MG tablet   Oral   Take 1 tablet (2 mg total) by mouth daily with breakfast.   90 tablet   1   . Glucose Blood (BAYER BREEZE 2 TEST) DISK      Use one test strip each time glucose needs testing.   50 each   2   . hydrALAZINE (APRESOLINE) 25 MG tablet   Oral   Take 1 tablet (25 mg total) by mouth 3 (three) times daily. As needed for bp > 160   270 tablet   1   . hydrocodone-acetaminophen (LORCET-HD) 5-500 MG per capsule   Oral   Take 1 capsule by mouth every 6 (six) hours as needed.   30 capsule   3   . levothyroxine (SYNTHROID, LEVOTHROID) 50 MCG tablet   Oral   Take 1 tablet (50 mcg total) by mouth daily.   90 tablet   3   . losartan (COZAAR) 50 MG tablet               . nebivolol (BYSTOLIC) 5 MG tablet   Oral   Take 1 tablet (5 mg total) by mouth daily.   90 tablet   1   . nystatin (MYCOSTATIN) powder   Topical   Apply topically 2 (two) times daily.   60 g  3   . oxyCODONE-acetaminophen  (PERCOCET/ROXICET) 5-325 MG per tablet   Oral   Take 1 tablet by mouth every 6 (six) hours as needed.   30 tablet   0   . pregabalin (LYRICA) 50 MG capsule      Take 1 tablet in the morning and 2 tablets in the evening.   90 capsule   2   . rOPINIRole (REQUIP) 1 MG tablet   Oral   Take 1 tablet (1 mg total) by mouth at bedtime.   90 tablet   2   . sitaGLIPtin (JANUVIA) 100 MG tablet   Oral   Take 1 tablet (100 mg total) by mouth daily.   90 tablet   2   . traMADol (ULTRAM) 50 MG tablet   Oral   Take 50 mg by mouth 4 (four) times daily as needed.           Allergies Diovan; Erythromycin; Flagyl; Glucophage; Xanax; Amoxicillin; Demeclocycline; Erythromycin base; Metformin; and Tetracyclines & related  Family History  Problem Relation Age of Onset  . Heart disease Father   . Hypertension Father   . Prostate cancer Father   . Stroke Father   . Osteoporosis Father   . Stroke Mother   . Depression Mother   . Headache Mother   . Heart disease Mother   . Thyroid disease Mother   . Hypertension Mother   . Diabetes Daughter   . Heart disease Daughter   . Hypertension Daughter   . Hypertension Son     Social History History  Substance Use Topics  . Smoking status: Never Smoker   . Smokeless tobacco: Never Used  . Alcohol Use: No    Review of Systems  Constitutional: Negative for fever. Eyes: Negative for visual changes. ENT: Negative for sore throat. Cardiovascular: Negative for chest pain. Respiratory: Negative for shortness of breath. Gastrointestinal: Negative for abdominal pain, vomiting and diarrhea. Genitourinary: Negative for dysuria. Musculoskeletal: Negative for back pain. Skin: Positive for rash. Neurological: Negative for headaches, focal weakness or numbness.   10-point ROS otherwise negative.  ____________________________________________   PHYSICAL EXAM:  VITAL SIGNS: ED Triage Vitals  Enc Vitals Group     BP 01/18/15 0339 128/43  mmHg     Pulse Rate 01/18/15 0339 57     Resp 01/18/15 0339 18     Temp 01/18/15 0339 98.3 F (36.8 C)     Temp Source 01/18/15 0339 Oral     SpO2 01/18/15 0339 96 %     Weight 01/18/15 0339 235 lb (106.595 kg)     Height 01/18/15 0339 5\' 4"  (1.626 m)     Head Cir --      Peak Flow --      Pain Score 01/18/15 0340 8     Pain Loc --      Pain Edu? --      Excl. in [place]? --      Constitutional: Alert and oriented. Well appearing and in no distress. Eyes: Conjunctivae are normal. PERRL. Normal extraocular movements. ENT   Head: Normocephalic and atraumatic.   Nose: No congestion/rhinnorhea.   Mouth/Throat: Mucous membranes are moist.   Neck: No stridor. Cardiovascular: Normal rate, regular rhythm. Normal and symmetric distal pulses are present in all extremities. No murmurs, rubs, or gallops. Respiratory: Normal respiratory effort without tachypnea nor retractions. Breath sounds are clear and equal bilaterally. No wheezes/rales/rhonchi. Gastrointestinal: Soft and nontender. No distention. There is no CVA tenderness. Genitourinary: deferred Musculoskeletal: Nontender  with normal range of motion in all extremities. No joint effusions.  No lower extremity tenderness nor edema. Neurologic:  Normal speech and language. No gross focal neurologic deficits are appreciated. Speech is normal.  Skin:  Pain with light touch to left zygomatic arch region, mild erythema noted. Psychiatric: Mood and affect are normal. Speech and behavior are normal. Patient exhibits appropriate insight and judgment.  ____________________________________________       INITIAL IMPRESSION / ASSESSMENT AND PLAN / ED COURSE  Pertinent labs & imaging results that were available during my care of the patient were reviewed by me and considered in my medical decision making (see chart for details).    ____________________________________________   FINAL CLINICAL IMPRESSION(S) / ED DIAGNOSES  Final  diagnoses:  Skin rash      Gregor Hams, MD 01/19/15 [phone]

## 2015-01-18 NOTE — ED Notes (Signed)
Pt arrived to the ED accompanied by husband for skin irritation/burning in the face around the left eye. Pt states that when she went to bed she began experiencing a burning sensation and "swelling" in the face. Pt thinks that is related to a anti-flee medication that she applied to her dogs. Pt is AOx4 in no apparent distress with no accompanied symptoms.

## 2015-01-19 ENCOUNTER — Telehealth: Payer: Self-pay | Admitting: Internal Medicine

## 2015-01-19 NOTE — Telephone Encounter (Signed)
Pt was seen in the ER. Pt dx with shingles. Pt is take medication given for 7 days (pt on day 2 or 7). Pt has not broken out on her face yet and was wonder if she need to finish take the rest of medication.msn

## 2015-01-19 NOTE — Telephone Encounter (Signed)
She was very polite on the phone, we had a great conversation, I would have never known it was the same person.

## 2015-01-19 NOTE — Telephone Encounter (Signed)
Agree.  This is the patinet who was very rude to staff last week when her medications were not ready for pick up at the time she deemed appropriate.  (See other notes).  Was she courteous to you?

## 2015-01-19 NOTE — Telephone Encounter (Signed)
Spoke with the patient, clarified with patient what her concerns were.  Patient went to ER last night with burning facial pain.  Dr in the ER believes she has the beginning stages of shingles, received two doses in the ER and then filled prescription this morning.  The medication is very hard on her stomach.  She has IBS already, discussed options to make that better while on the medication.  It is a 7 day course of Valtrex.  Patient understands once she is feeling better she needs a shingles vaccine.  Discussed the need to continue the medications since she has not had further symptoms.

## 2015-02-08 DIAGNOSIS — N3941 Urge incontinence: Secondary | ICD-10-CM | POA: Diagnosis not present

## 2015-02-08 DIAGNOSIS — R350 Frequency of micturition: Secondary | ICD-10-CM | POA: Diagnosis not present

## 2015-02-08 DIAGNOSIS — D4412 Neoplasm of uncertain behavior of left adrenal gland: Secondary | ICD-10-CM | POA: Diagnosis not present

## 2015-02-08 DIAGNOSIS — R339 Retention of urine, unspecified: Secondary | ICD-10-CM | POA: Diagnosis not present

## 2015-02-22 ENCOUNTER — Ambulatory Visit (INDEPENDENT_AMBULATORY_CARE_PROVIDER_SITE_OTHER): Payer: Medicare Other | Admitting: Podiatry

## 2015-02-22 ENCOUNTER — Ambulatory Visit: Payer: Medicare Other

## 2015-02-22 DIAGNOSIS — E1140 Type 2 diabetes mellitus with diabetic neuropathy, unspecified: Secondary | ICD-10-CM

## 2015-02-22 DIAGNOSIS — M79676 Pain in unspecified toe(s): Secondary | ICD-10-CM

## 2015-02-22 DIAGNOSIS — E1149 Type 2 diabetes mellitus with other diabetic neurological complication: Secondary | ICD-10-CM

## 2015-02-22 DIAGNOSIS — B351 Tinea unguium: Secondary | ICD-10-CM | POA: Diagnosis not present

## 2015-02-22 DIAGNOSIS — M2040 Other hammer toe(s) (acquired), unspecified foot: Secondary | ICD-10-CM

## 2015-02-22 DIAGNOSIS — L84 Corns and callosities: Secondary | ICD-10-CM

## 2015-02-22 NOTE — Progress Notes (Signed)
Subjective: 69 y.o. returns the office today for painful, elongated, thickened toenails which she is unable to trim herself. Denies any redness or drainage around the nails. She is also requesting a prescription for diabetic shoes. Denies any acute changes since last appointment and no new complaints today. Denies any systemic complaints such as fevers, chills, nausea, vomiting.   Objective: AAO 3, NAD DP/PT pulses palpable, CRT less than 3 seconds Protective sensation decreased with Simms Weinstein monofilament, Achilles tendon reflex intact.  Nails hypertrophic, dystrophic, elongated, brittle, discolored 10. There is tenderness overlying the nails 1-5 bilaterally. There is no surrounding erythema or drainage along the nail sites. Multiple digital contractures are present. There is prominence of the fifth metatarsal base on left side. No open lesions. No other areas of tenderness bilateral lower extremities. No overlying edema, erythema, increased warmth. No pain with calf compression, swelling, warmth, erythema.  Assessment: Patient presents with symptomatic onychomycosis  Plan: -Treatment options including alternatives, risks, complications were discussed -Nails sharply debrided 10 without complication/bleeding. -Rx new diabetic shoes was given to the patient.  -Discussed daily foot inspection. If there are any changes, to call the office immediately.  -Follow-up in 3 months or sooner if any problems are to arise. In the meantime, encouraged to call the office with any questions, concerns, changes symptoms.   Celesta Gentile, DPM

## 2015-03-07 ENCOUNTER — Telehealth: Payer: Self-pay | Admitting: Gastroenterology

## 2015-03-07 DIAGNOSIS — K529 Noninfective gastroenteritis and colitis, unspecified: Secondary | ICD-10-CM

## 2015-03-07 NOTE — Telephone Encounter (Signed)
This pt was taking Lot[facility]x and [facility] so we changed her to Pearson. It is no longer working for her. She has extreme diarrhea. Change we change her to Lomotil? We are trying to change to something the [facility] will pay for.   Rx for Lomotil has been sent to pts pharmacy.

## 2015-03-07 NOTE — Telephone Encounter (Signed)
Patient has been waiting on a call back about her medication being changed she is now out and wanted to know what Dr.Wohl said about changing her meds to something else the [facility] would cover patient said she has been waiting on a call for 3 weeks please call her, patient Viberzi isn't working

## 2015-03-08 ENCOUNTER — Encounter: Payer: Self-pay | Admitting: Internal Medicine

## 2015-03-08 ENCOUNTER — Ambulatory Visit (INDEPENDENT_AMBULATORY_CARE_PROVIDER_SITE_OTHER): Payer: Medicare Other | Admitting: Internal Medicine

## 2015-03-08 VITALS — BP 124/60 | HR 73 | Ht 64.5 in | Wt 232.0 lb

## 2015-03-08 DIAGNOSIS — G4710 Hypersomnia, unspecified: Secondary | ICD-10-CM | POA: Diagnosis not present

## 2015-03-08 DIAGNOSIS — G4733 Obstructive sleep apnea (adult) (pediatric): Secondary | ICD-10-CM | POA: Diagnosis not present

## 2015-03-08 DIAGNOSIS — Z9989 Dependence on other enabling machines and devices: Secondary | ICD-10-CM

## 2015-03-08 DIAGNOSIS — R55 Syncope and collapse: Secondary | ICD-10-CM | POA: Diagnosis not present

## 2015-03-08 NOTE — Patient Instructions (Signed)
Order- schedule CPAP titration sleep study, to be followed the next day by a Multiple Sleep Latency Test  For dx Hypersomnia with OSA.    Ok to take Requip, and tylenol or ibuprofen if needed. Need to try not to take lunesta, valium, lyrica, tramadol or other potentially sedating meds for 3 days including the sleep studies.

## 2015-03-08 NOTE — Telephone Encounter (Signed)
Please restart her on the lomotil.

## 2015-03-08 NOTE — Progress Notes (Signed)
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 Will need new DME-not [facility] at patient request.  Followed by Dr Melvyn Novas for Pulmonary- DOE, LOV 12/06/14- Dr Gustavus Bryant note reviewed: Referred back by Dr [name]. Pt c/o "spells of passing out or falling asleep easily" since Jan 2016. She states she stays tired all of the time and only sleeps about 4 hours every night. Falls asleep while sitting and wathcing tv, riding in a car.    Most recent cpap 08/2012 titration > Set at 7 because had lost wt but gained back 50 lb and Does not know why that happened, newly on thyroid x sev months and having up to 25 bm's a day but still gaining wt - records show wt 80 in October then started problems late Dec2015 / Early Jan 2016 feeling a lot more sleepy also with periods of decreased responsiveness and had one spell where didn't come around and admitted to [facility] with cardiac w/u neg in Jan and Neurology w/u since then also inconclusive  Does not sleep well at hs with multiple drugs benzo's / has not tried belsomra       NPSG 08/14/12 at [facility]/ Sleep Med   Mild OSA, AHI 13.2/ hr, weight 218 lbs   CPAP 6 Meds include lunesta 2 mg, Lorcet-HD, Requip Husband here elaborate on wife's observations. She worked for many years rotating shift and says when she was younger she had no difficulty staying awake during the daytime. Diagnosed with obstructive sleep apnea many years ago and wearing CPAP for many years. Recent sleep study confirmed diagnosis for continuation of CPAP. She is wanting to switch from current DME. ENT surgery-tonsillectomy as a child. Current problem-over the last year she has found it much harder to fall asleep and stay asleep in his had much more problem with sleepiness during the daytime. Bedtime around midnight but often awake for 2 hours. Wakes 2 or 3 times before up some where between 9:30 and 10:30 AM. Has been taking Lunesta plus Valium most  nights, plus Lyrica 150 mg at bedtime and 75 mg daytime, and tramadol. Describes "passing out" episodes for which she has been hospitalized and has had a neurologic workup by Dr. Jannifer Franklin. Husband has witnessed these and says it looks as if she falls asleep. She drifts off very easily whenever sitting quietly but is easy to arouse. Denies cataplexy or sleep paralysis. Vivid dreams are not noted during naps. Thyroid disorders treated. Little caffeine. She has several opioid pain medicines for chronic back and joint pains, takes Benadryl occasionally if needed for seasonal rhinitis symptoms. Little caffeine  Prior to Admission medications   Medication Sig Start Date End Date Taking? Authorizing Provider  albuterol (PROVENTIL HFA;VENTOLIN HFA) 108 (90 BASE) MCG/ACT inhaler Inhale 2 puffs into the lungs every 6 (six) hours as needed for wheezing or shortness of breath. 05/04/14  Yes [name], MD  benzonatate (TESSALON) 200 MG capsule Take 1 capsule (200 mg total) by mouth 3 (three) times daily as needed for cough. 08/03/14  Yes Rubbie Battiest, NP  Blood Pressure Monitoring (BLOOD PRESSURE MONITOR/ARM) DEVI Automatic BP machine.  Normal adult size arm 11/08/14  Yes [name], MD  Cholecalciferol (VITAMIN D3) 10000 UNITS capsule Take 10,000 Units by mouth once a week.   Yes Historical Provider, MD  cyanocobalamin (,VITAMIN B-12,) 1000 MCG/ML injection 1000 ml (1 ml) injection twice monthly 01/19/13  Yes [name], MD  diazepam (VALIUM) 10 MG tablet Take 1 tablet (10 mg total) by mouth at bedtime as needed for anxiety. 12/30/14  Yes [name], MD  estradiol (ESTRACE) 0.5 MG tablet Take 1 tablet (0.5 mg total) by mouth daily. 10/01/12  Yes [name], MD  eszopiclone (LUNESTA) 2 MG TABS tablet Take 1 tablet (2 mg total) by mouth at bedtime. Take immediately before bedtime 12/30/14  Yes [name], MD  fluticasone (FLONASE) 50 MCG/ACT nasal spray Place 2 sprays into both nostrils daily.  08/03/14  Yes Rubbie Battiest, NP  glimepiride (AMARYL) 2 MG tablet Take 1 tablet (2 mg total) by mouth daily with breakfast. 10/29/14  Yes [name], MD  Glucose Blood (BAYER BREEZE 2 TEST) DISK Use one test strip each time glucose needs testing. 08/03/14  Yes Rubbie Battiest, NP  hydrALAZINE (APRESOLINE) 25 MG tablet Take 1 tablet (25 mg total) by mouth 3 (three) times daily. As needed for bp > 160 08/03/14  Yes Rubbie Battiest, NP  hydrocodone-acetaminophen (LORCET-HD) 5-500 MG per capsule Take 1 capsule by mouth every 6 (six) hours as needed. 07/29/12  Yes [name], MD  levothyroxine (SYNTHROID, LEVOTHROID) 50 MCG tablet Take 1 tablet (50 mcg total) by mouth daily. 08/14/14  Yes [name], MD  nebivolol (BYSTOLIC) 5 MG tablet Take 1 tablet (5 mg total) by mouth daily. 08/03/14  Yes Rubbie Battiest, NP  nystatin (MYCOSTATIN) powder Apply topically 2 (two) times daily. 08/03/14  Yes Rubbie Battiest, NP  oxyCODONE-acetaminophen (PERCOCET/ROXICET) 5-325 MG per tablet Take 1 tablet by mouth every 6 (six) hours as needed. 07/29/12  Yes [name], MD  pregabalin (LYRICA) 50 MG capsule Take 1 tablet in the morning and 2 tablets in the evening. 05/31/14  Yes Ward Givens, NP  rOPINIRole (REQUIP) 1 MG tablet Take 1 tablet (1 mg total) by mouth at bedtime. 10/01/12  Yes [name], MD  sitaGLIPtin (JANUVIA) 100 MG tablet Take 1 tablet (100 mg total) by mouth daily. 08/03/14  Yes Rubbie Battiest, NP  traMADol (ULTRAM) 50 MG tablet Take 50 mg by mouth 4 (four) times daily as needed. 10/01/12  Yes [name], MD  furosemide (LASIX) 20 MG tablet Take 1 tablet (20 mg total) by mouth daily. Patient not taking: Reported on 03/08/2015 12/09/13   [name], MD   Past Medical History  Diagnosis Date  . Diabetes mellitus   . Depression   . Hypertension   . Cervical spinal stenosis 1994    due to trauma to back ([employer] accident), has intermittent paralysis and parasthesias  . Adrenal mass   . IBS  (irritable bowel syndrome)   . Obstructive sleep apnea     using CPAP  . Anemia     iron deficient post  2 unit txfsn 2009, normal endo/colonoscopy by [facility]  . Esophageal stenosis March 2011    with transietn outlet obstruction by food, cleared by EGD   . LBBB (left bundle branch block)   . Obesity   . Apnea   . Headache(784.0)   . Arthritis   . Restless leg syndrome   . Dizziness     chronic dizziness  . Adrenal mass, left   . 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 Abnormality of gait 03/25/2013  . Syncope and collapse 03/12/2014  . Cervicogenic headache 03/23/2014   Past Surgical History  Procedure Laterality Date  . Gastric bypass    . Abdominal  hysterectomy    . Gastric bypass  2000, 2005    Dr. Debroah Loop  . Appendectomy    . Gallbladder surgery  1976  . Rotator cuff repair      right  . Joint replacement  2007    bilateral knee. [name],  [name]  . Spine surgery  1995    [place]  . Interstim implant placement  April 2013    [place]  . Gallbladder surgery  resection  . Total abdominal hysterectomy w/ bilateral salpingoophorectomy  1974   Family History  Problem Relation Age of Onset  . Heart disease Father   . Hypertension Father   . Prostate cancer Father   . Stroke Father   . Osteoporosis Father   . Stroke Mother   . Depression Mother   . Headache Mother   . Heart disease Mother   . Thyroid disease Mother   . Hypertension Mother   . Diabetes Daughter   . Heart disease Daughter   . Hypertension Daughter   . Hypertension Son    Social History   Social History  . Marital Status: Married    Spouse Name: [name]   . Number of Children: 2  . Years of Education: College    Occupational History  .     Social History Main Topics  . Smoking status: Never Smoker   . Smokeless tobacco: Never Used  . Alcohol Use: No  . Drug Use: No  . Sexual Activity: Not Currently   Other Topics Concern  . Not on file   Social History Narrative   Patient lives at  home with husband [name].    Patient has 2 children.    Patient has [employer].    Patient is on disability.    Patient is right handed.    ROS-see HPI   Negative unless "+" Constitutional:    weight loss, night sweats, fevers, chills,+ fatigue, lassitude. HEENT:   + headaches, +ifficulty swallowing, tooth/dental problems, sore throat,       sneezing, itching, ear ache, +nasal congestion, post nasal drip, snoring CV:    +chest pain, orthopnea, PND, swelling in lower extremities, anasarca,                                                    dizziness, palpitations Resp:  +shortness of breath with exertion or at rest.                productive cough,   non-productive cough, coughing up of blood.              change in color of mucus.  wheezing.   Skin:    rash or lesions. GI:  No-   heartburn, indigestion, abdominal pain, nausea, vomiting, diarrhea,                 change in bowel habits, loss of appetite GU: dysuria, change in color of urine, no urgency or frequency.   flank pain.  MS:   +joint pain, stiffness, decreased range of motion, back pain. Neuro-     nothing unusual Psych:  change in mood or affect.  depression or anxiety.   memory loss.  OBJ- Physical Exam General- Alert, Oriented, Affect-appropriate, Distress- none acute, +obese,+ talkative and brisk. Skin- rash-none, lesions- none, excoriation- none Lymphadenopathy- none Head- atraumatic  Eyes- Gross vision intact, PERRLA, conjunctivae and secretions clear            Ears- Hearing, canals-normal            Nose- Clear, no-Septal dev, mucus, polyps, erosion, perforation             Throat- Mallampati III , mucosa clear , drainage- none, tonsils- atrophic, + dentures Neck- flexible , trachea midline, no stridor , thyroid nl, carotid no bruit Chest - symmetrical excursion , unlabored           Heart/CV- RRR , no murmur , no gallop  , no rub, nl s1 s2                           - JVD- none , edema- none, stasis  changes- none, varices- none           Lung- clear to P&A, wheeze- none, cough- none , dullness-none, rub- none           Chest wall-  Abd-  Br/ Gen/ Rectal- Not done, not indicated Extrem- cyanosis- none, clubbing, none, atrophy- none, strength- nl Neuro- grossly intact to observation

## 2015-03-08 NOTE — Assessment & Plan Note (Signed)
Some of this sounds like excessive daytime sleepiness but she has episodes where she is poorly responsive to stimuli and loses blocks of time. I don't get a history suggesting cataplexy or sleep paralysis is likely. There may be more sedation from her various pain medications(hydrocodone, Lyrica, Valium, tramadol) than she realizes. 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 She thinks she can be off of her medicines for 3 days to permit a multiple sleep latency test for formal evaluation of daytime sleepiness. Plan-schedule multiple sleep latency test following her CPAP titration night.

## 2015-03-09 MED ORDER — DIPHENOXYLATE-ATROPINE 2.5-0.025 MG PO TABS: 1.0000 | ORAL_TABLET | Freq: Four times a day (QID) | ORAL | Status: DC | PRN

## 2015-03-11 ENCOUNTER — Other Ambulatory Visit: Payer: Self-pay | Admitting: Internal Medicine

## 2015-03-11 DIAGNOSIS — R918 Other nonspecific abnormal finding of lung field: Secondary | ICD-10-CM

## 2015-03-18 ENCOUNTER — Ambulatory Visit: Payer: BLUE CROSS/BLUE SHIELD

## 2015-03-29 ENCOUNTER — Ambulatory Visit: Payer: BLUE CROSS/BLUE SHIELD

## 2015-04-05 ENCOUNTER — Ambulatory Visit
Admission: RE | Admit: 2015-04-05 | Discharge: 2015-04-05 | Disposition: A | Discharge status: Home / Self Care | Payer: Medicare Other | Source: Ambulatory Visit | Attending: Internal Medicine | Admitting: Internal Medicine

## 2015-04-05 DIAGNOSIS — R0602 Shortness of breath: Secondary | ICD-10-CM | POA: Diagnosis not present

## 2015-04-05 DIAGNOSIS — I2510 Atherosclerotic heart disease of native coronary artery without angina pectoris: Secondary | ICD-10-CM | POA: Diagnosis not present

## 2015-04-05 DIAGNOSIS — R918 Other nonspecific abnormal finding of lung field: Secondary | ICD-10-CM | POA: Diagnosis not present

## 2015-04-05 DIAGNOSIS — D3502 Benign neoplasm of left adrenal gland: Secondary | ICD-10-CM | POA: Insufficient documentation

## 2015-04-06 ENCOUNTER — Telehealth: Payer: Self-pay | Admitting: Internal Medicine

## 2015-04-06 NOTE — Telephone Encounter (Signed)
Result Note     Call patient : Study is unremarkable, no change in recs - there is some calcifications in her arteries typical for her age so we will send copy to Dr [name] for f/u but no further pulm f/u needed  ---  I spoke with patient about results and she verbalized understanding and had no questions Results faxed.

## 2015-05-03 ENCOUNTER — Ambulatory Visit (INDEPENDENT_AMBULATORY_CARE_PROVIDER_SITE_OTHER): Payer: Medicare Other | Admitting: Internal Medicine

## 2015-05-03 ENCOUNTER — Other Ambulatory Visit: Payer: Self-pay

## 2015-05-03 ENCOUNTER — Encounter: Payer: Self-pay | Admitting: Internal Medicine

## 2015-05-03 ENCOUNTER — Telehealth: Payer: Self-pay | Admitting: Internal Medicine

## 2015-05-03 ENCOUNTER — Ambulatory Visit: Payer: BLUE CROSS/BLUE SHIELD | Admitting: Endocrinology

## 2015-05-03 ENCOUNTER — Other Ambulatory Visit: Payer: Self-pay | Admitting: *Deleted

## 2015-05-03 ENCOUNTER — Other Ambulatory Visit (INDEPENDENT_AMBULATORY_CARE_PROVIDER_SITE_OTHER): Payer: Medicare Other

## 2015-05-03 VITALS — BP 132/80 | HR 87 | Temp 98.5°F | Resp 12 | Wt 233.0 lb

## 2015-05-03 DIAGNOSIS — D3502 Benign neoplasm of left adrenal gland: Secondary | ICD-10-CM

## 2015-05-03 DIAGNOSIS — E039 Hypothyroidism, unspecified: Secondary | ICD-10-CM | POA: Diagnosis not present

## 2015-05-03 MED ORDER — SITAGLIPTIN PHOSPHATE 100 MG PO TABS: 100.0000 mg | ORAL_TABLET | Freq: Every day | ORAL | Status: DC

## 2015-05-03 MED ORDER — DEXAMETHASONE 1 MG PO TABS: ORAL_TABLET | ORAL | Status: DC

## 2015-05-03 NOTE — Progress Notes (Signed)
Patient ID: Denise Sharp, female   DOB: 02/26/1946, 69 y.o.   MRN: [id number]   HPI  Denise Sharp is a 69 y.o.-year-old female, referred by her PCP, [name], for evaluation and management of a L adrenal adenoma. She saw Dr Howell Rucks in the past, until she left the practice. Last visit with Dr. Howell Rucks was 6 months ago.   The following history was obtained from the patient, reviewing Dr. Boyd Kerbs notes, and also reviewing Epic records:  The patient has a known 16 mm left adrenal adenoma that was first detected in 10/2008 and re-imaged and found to be stable on a non-adrenal CT protocol in 11/2013 that was done for evaluation of abdominal pain. Right adrenal gland is normal in size without evidence of adenoma.   The patient was being followed by Dr Jacqlyn Larsen for the adrenal adenoma and 24 hour urine studies for cortisol, catecholamines, metanephrines, VMA have been normal in 03/2014. There is no corresponding 24 hour urine creatinine on the results, but 24 hour urine volume was normal: 1100 mL. The patient has prior "abnormal bloodwork for her adrenal gland" based on Urology report, however I was not able to review these results on the EMR. There is a mention that this could have been related to prior use of Prednisone. She did have a normal renin level in 03/2012 and a normal morning cortisol level at that time. 24 hour urine studies for fractionated catecholamines were also normal in 02/2012 when done by her cardiologist, Dr Audelia Acton.  12/03/2013: Abdominal CT with contrast showing stable size and aspect of her adrenal adenoma (1.6 cm).  10-06/2014: 1 mg dexamethasone suppression test normal, reading/aldosterone levels normal, plasma metanephrines normal, DHEAS normal: Component     Latest Ref Rng 05/03/2014 06/21/2014  Glucose     65-99 mg/dL  174 (H)  BUN     7-18 mg/dL  12  Creatinine     0.60-1.30 mg/dL  1.02  Sodium     136-145 mmol/L  141  Potassium     3.5-5.1 mmol/L  4.0  Chloride      98-107 mmol/L  106  CO2     21-32 mmol/L  24  Calcium     8.5-10.1 mg/dL  8.7  Osmolality     275-301  285  Anion gap     7-16  11  EGFR (African American)     >81m/min  >60  EGFR (Non-African Amer.)     >679mmin  57 (L)  ALDOSTERONE     0.0 - 30.0 ng/dL <1.0   Renin      1.10   ALDOS/RENIN RATIO     0.0 - 30.0 <.9   Metanephrine, Free     <=57 pg/mL <25   Normetanephrine, Free     <=148 pg/mL 54   Total Metanephrines-Plasma     <=205 pg/mL 54   DHEA-SO4     12 - 133 ug/dL 38   Renin Activity     0.25 - 5.82 ng/mL/h 1.11   Aldosterone, Serum     ng/dL 2   Cortisol, Plasma      1.1   Dexamethasone, Serum      197    11/09/2014:  24-hour urine cortisol 35 (normal), 24-hour urine metanephrines normal, 24-hour urine catecholamines normal: Component     Latest Ref Rng 11/09/2014  Total Volume - CF 24Hr U      1600  Epinephrine, 24 hr Urine      REPORT  Norepinephrine,  24 hr Ur     15 - 100 mcg/24 h 18  Calculated Total (E+NE)     26 - 121 mcg/24 h 18 (L)  Dopamine, 24 hr Urine     52 - 480 mcg/24 h 99  Creatinine, Urine mg/day-CATEUR     0.63 - 2.50 g/24 h 0.89  Metanephrines, Ur     90 - 315 mcg/24 h 71 (L)  Normetanephrine, 24H Ur     122 - 676 mcg/24 h 281  Metaneph Total, Ur     224 - 832 mcg/24 h 352  Creatinine, Urine      68.3  Creatinine, 24H Ur     700 - 1800 mg/day 1093  Cortisol (Ur), Free     4.0 - 50.0 mcg/24 h 35.8  Results received     0.63 - 2.50 g/24 h 1.13   12/03/2014: Abdominal CT without contrast showing stable size and aspect of her adrenal adenoma: 1.6 cm nodule at the anterior aspect of the left adrenal gland  measuring < 10 Hounsfield units on this noncontrast exam (series 2, images 32 through 34) diagnostic for benign adenoma. It is unchanged in size since 2009.  Of note, despite negative pheochromocytoma workup, patient is convinced that her high blood pressure is related to her adrenal mass.  She also has has  hypothyroidism for last 2 years. She takes LT4 50 mg daily, >30 min before b'fast. No PPI, Ca, Fe, MVI.  ROS: Constitutional: + weight gain, + fatigue, + feeling excessively cold, + poor sleep, + nocturia Eyes: + blurry vision, no xerophthalmia ENT: no sore throat, no nodules palpated in throat, + dysphagia/no odynophagia, no hoarseness, + hypoacusis Cardiovascular: + CP/+ SOB/+ palpitations/+ leg swelling Respiratory: no cough/+ SOB Gastrointestinal: no N/V/+ D/no C, no heartburn Musculoskeletal: + muscle aches/+ joint aches Skin: no rashes, + itching, + easy bruising, + hair loss Neurological: no tremors/numbness/tingling/dizziness, + HA Psychiatric: + both: depression/anxiety  Past Medical History  Diagnosis Date  . Diabetes mellitus   . Depression   . Hypertension   . Cervical spinal stenosis 1994    due to trauma to back ([employer] accident), has intermittent paralysis and parasthesias  . Adrenal mass ([place])   . IBS (irritable bowel syndrome)   . Obstructive sleep apnea     using CPAP  . Anemia     iron deficient post  2 unit txfsn 2009, normal endo/colonoscopy by [facility]  . Esophageal stenosis March 2011    with transietn outlet obstruction by food, cleared by EGD   . LBBB (left bundle branch block)   . Obesity   . Apnea   . Headache(784.0)   . Arthritis   . Restless leg syndrome   . Dizziness     chronic dizziness  . Adrenal mass, left ([place])   . 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 Abnormality of gait 03/25/2013  . Syncope and collapse 03/12/2014  . Cervicogenic headache 03/23/2014   Past Surgical History  Procedure Laterality Date  . Gastric bypass    . Abdominal hysterectomy    . Gastric bypass  2000, 2005    Dr. Debroah Loop  . Appendectomy    . Gallbladder surgery  1976  . Rotator cuff repair      right  . Joint replacement  2007    bilateral knee. [name],  [name]  . Spine surgery  1995    [place]  . Interstim implant placement  April 2013    [place]  .  Gallbladder  surgery  resection  . Total abdominal hysterectomy w/ bilateral salpingoophorectomy  1974   Social History   Social History  . Marital Status: Married    Spouse Name: [name]   . Number of Children: 2  . Years of Education: College    Occupational History  .     Social History Main Topics  . Smoking status: Never Smoker   . Smokeless tobacco: Never Used  . Alcohol Use: No  . Drug Use: No  . Sexual Activity: Not Currently   Other Topics Concern  . Not on file   Social History Narrative   Patient lives at home with husband [name].    Patient has 2 children.    Patient has [employer].    Patient is on disability.    Patient is right handed.    Current Outpatient Prescriptions on File Prior to Visit  Medication Sig Dispense Refill  . albuterol (PROVENTIL HFA;VENTOLIN HFA) 108 (90 BASE) MCG/ACT inhaler Inhale 2 puffs into the lungs every 6 (six) hours as needed for wheezing or shortness of breath. 2 Inhaler 3  . benzonatate (TESSALON) 200 MG capsule Take 1 capsule (200 mg total) by mouth 3 (three) times daily as needed for cough. 180 capsule 1  . Blood Pressure Monitoring (BLOOD PRESSURE MONITOR/ARM) DEVI Automatic BP machine.  Normal adult size arm 1 Device 0  . Cholecalciferol (VITAMIN D3) 10000 UNITS capsule Take 10,000 Units by mouth once a week.    . cyanocobalamin (,VITAMIN B-12,) 1000 MCG/ML injection 1000 ml (1 ml) injection twice monthly 25 mL 3  . diazepam (VALIUM) 10 MG tablet Take 1 tablet (10 mg total) by mouth at bedtime as needed for anxiety. 90 tablet 1  . diphenoxylate-atropine (LOMOTIL) 2.5-0.025 MG per tablet Take 1-2 tablets by mouth 4 (four) times daily as needed for diarrhea or loose stools. 240 tablet 2  . estradiol (ESTRACE) 0.5 MG tablet Take 1 tablet (0.5 mg total) by mouth daily. 90 tablet 2  . eszopiclone (LUNESTA) 2 MG TABS tablet Take 1 tablet (2 mg total) by mouth at bedtime. Take immediately before bedtime 90 tablet 1  . fluticasone  (FLONASE) 50 MCG/ACT nasal spray Place 2 sprays into both nostrils daily. 16 g 6  . furosemide (LASIX) 20 MG tablet Take 1 tablet (20 mg total) by mouth daily. (Patient not taking: Reported on 03/08/2015) 90 tablet 3  . glimepiride (AMARYL) 2 MG tablet Take 1 tablet (2 mg total) by mouth daily with breakfast. 90 tablet 1  . Glucose Blood (BAYER BREEZE 2 TEST) DISK Use one test strip each time glucose needs testing. 50 each 2  . hydrALAZINE (APRESOLINE) 25 MG tablet Take 1 tablet (25 mg total) by mouth 3 (three) times daily. As needed for bp > 160 270 tablet 1  . hydrocodone-acetaminophen (LORCET-HD) 5-500 MG per capsule Take 1 capsule by mouth every 6 (six) hours as needed. 30 capsule 3  . levothyroxine (SYNTHROID, LEVOTHROID) 50 MCG tablet Take 1 tablet (50 mcg total) by mouth daily. 90 tablet 3  . nebivolol (BYSTOLIC) 5 MG tablet Take 1 tablet (5 mg total) by mouth daily. 90 tablet 1  . nystatin (MYCOSTATIN) powder Apply topically 2 (two) times daily. 60 g 3  . oxyCODONE-acetaminophen (PERCOCET/ROXICET) 5-325 MG per tablet Take 1 tablet by mouth every 6 (six) hours as needed. 30 tablet 0  . pregabalin (LYRICA) 50 MG capsule Take 1 tablet in the morning and 2 tablets in the evening. 90 capsule 2  .  rOPINIRole (REQUIP) 1 MG tablet Take 1 tablet (1 mg total) by mouth at bedtime. 90 tablet 2  . sitaGLIPtin (JANUVIA) 100 MG tablet Take 1 tablet (100 mg total) by mouth daily. 90 tablet 2  . traMADol (ULTRAM) 50 MG tablet Take 50 mg by mouth 4 (four) times daily as needed.     No current facility-administered medications on file prior to visit.   Allergies  Allergen Reactions  . Diovan [Valsartan]   . Erythromycin   . Flagyl [Metronidazole]   . Glucophage [Metformin Hcl]   . Xanax [Alprazolam]   . Amoxicillin Diarrhea  . Demeclocycline Hives  . Erythromycin Base   . Metformin   . Tetracyclines & Related Hives and Rash   Family History  Problem Relation Age of Onset  . Heart disease Father    . Hypertension Father   . Prostate cancer Father   . Stroke Father   . Osteoporosis Father   . Stroke Mother   . Depression Mother   . Headache Mother   . Heart disease Mother   . Thyroid disease Mother   . Hypertension Mother   . Diabetes Daughter   . Heart disease Daughter   . Hypertension Daughter   . Hypertension Son    PE: BP 132/80 mmHg  Pulse 87  Temp(Src) 98.5 F (36.9 C) (Oral)  Resp 12  Wt 233 lb (105.688 kg)  SpO2 97% Body mass index is 39.39 kg/(m^2). Wt Readings from Last 3 Encounters:  05/03/15 233 lb (105.688 kg)  03/08/15 232 lb (105.235 kg)  01/18/15 235 lb (106.595 kg)   Constitutional: overweight, in NAD Eyes: PERRLA, EOMI, no exophthalmos, no lid lag, no stare ENT: moist mucous membranes, no thyromegaly, no cervical lymphadenopathy Cardiovascular: RRR, No MRG Respiratory: CTA B Gastrointestinal: abdomen soft, NT, ND, BS+ Musculoskeletal: no deformities, strength intact in all 4 Skin: moist, warm, no rashes Neurological: no tremor with outstretched hands, DTR normal in all 4  ASSESSMENT: 1. Left Adrenal adenoma  2. Hypothyroidism  PLAN:  1. Patient with a L adrenal nodule, stable since diagnosis in 2009 with previous negative hormonal workup. - We discussed about the fact that there are 3 possible scenarios: - A nonfunctioning adrenal nodule - the most likely scenario for her - A functioning adrenal adenoma - which can hypersecrete catecholamines/metanephrines, cortisol, or aldosterone - these tests have been performed and they were negative - Adrenal cancer/metastasis - very unlikely due to stable appearance of her adrenal nodule on CT scan   She had several abdominal CT scans that showed that the mass is unchanged, and at this point I would not suggest any more imaging studies of her adrenal. She agrees with the plan. - To differentiate between a functioning and a nonfunctioning adrenal nodule, we usually need to rule out hypersecretion by  checking the following tests  - dexamethasone suppression test (DST) to rule out Cushing syndrome (6% of adrenal incidentalomas) - this was normal for her in the past - Plasma fractionated metanephrines and catecholamines to rule out pheochromocytoma (3% of adrenal incidentalomas) - these were also normal for her in the past - Plasma renin activity and aldosterone level to rule out primary hyperaldosteronism (0.6% of adrenal incidentalomas) - this studies were normal for her in the past - we discussed about f/u is recommended by the guidelines:   hormonal testing yearly for 5 years. We will repeat the above tests today. I advised pt to take the dexamethasone tablets (1 mg total dose, sent to  pharmacy) at 11 PM the night before coming to the lab to have a cortisol level drawn. A previous dexamethasone level was normal, so patient is not a fast metabolizer.   CT scans yearly x1-2 - she had more than that, with stable results, as mentioned above  - I explained all the above to the patient, and she agrees with the plan. - I will see her back in a year. We will repeat her adrenal test at that point, and if they are normal, no further investigation is likely necessary.  2. Hypothyroidism - she would also want me to manage her hypothyroidism - will continue LT4 50 mcg for now - I advised her to take the thyroid hormone every day, with water, >30 minutes before breakfast, separated by >4 hours from acid reflux medications, calcium, iron, multivitamins. She appears to be taking it correctly.  - check TFTs now  Orders today: Orders Placed This Encounter  Procedures  . TSH  . T4, free  . Aldosterone + renin activity w/ ratio  . Potassium  . Metanephrines, plasma   - time spent with the patient: 40 minutes, of which >50% was spent in obtaining information about her symptoms, reviewing her previous labs, evaluations, and treatments, counseling her about her 2 conditions (please see the discussed topics  above), and developing a plan to further investigate them; she had a number of questions which I addressed.  Component     Latest Ref Rng 05/03/2015 05/06/2015  Cortisol, Plasma     (DST)  1.7   Component     Latest Ref Rng 05/03/2015          Metanephrine, Free     <=57 pg/mL <25  Normetanephrine, Free     <=148 pg/mL 56  Total Metanephrines-Plasma     <=205 pg/mL 56  PRA LC/MS/MS     0.25 - 5.82 ng/mL/h 1.12  ALDO / PRA Ratio     0.9 - 28.9 Ratio SEE NOTE  ALDOSTERONE      <1  Potassium     3.5 - 5.1 mEq/L 3.8  TSH     0.35 - 4.50 uIU/mL 1.16  Free T4     0.60 - 1.60 ng/dL 1.10   All adrenal and thyroid results are now back and they are normal.

## 2015-05-03 NOTE — Telephone Encounter (Signed)
Pt would like to get medication sitaGLIPtin (JANUVIA) 100 MG tablet refilled and sent to [facility]. Thank You!

## 2015-05-03 NOTE — Telephone Encounter (Signed)
Refill completed.

## 2015-05-03 NOTE — Patient Instructions (Signed)
Please stop at [facility] lab.  Please take the Dexamethasone at 11 pm the night before coming for labs at 8 am.   Please return in 1 year.

## 2015-05-04 DIAGNOSIS — M9901 Segmental and somatic dysfunction of cervical region: Secondary | ICD-10-CM | POA: Diagnosis not present

## 2015-05-04 DIAGNOSIS — M546 Pain in thoracic spine: Secondary | ICD-10-CM | POA: Diagnosis not present

## 2015-05-04 DIAGNOSIS — M9902 Segmental and somatic dysfunction of thoracic region: Secondary | ICD-10-CM | POA: Diagnosis not present

## 2015-05-04 DIAGNOSIS — M5136 Other intervertebral disc degeneration, lumbar region: Secondary | ICD-10-CM | POA: Diagnosis not present

## 2015-05-04 DIAGNOSIS — M5030 Other cervical disc degeneration, unspecified cervical region: Secondary | ICD-10-CM | POA: Diagnosis not present

## 2015-05-04 DIAGNOSIS — M5134 Other intervertebral disc degeneration, thoracic region: Secondary | ICD-10-CM | POA: Diagnosis not present

## 2015-05-04 DIAGNOSIS — M9903 Segmental and somatic dysfunction of lumbar region: Secondary | ICD-10-CM | POA: Diagnosis not present

## 2015-05-04 DIAGNOSIS — M542 Cervicalgia: Secondary | ICD-10-CM | POA: Diagnosis not present

## 2015-05-04 DIAGNOSIS — M545 Low back pain: Secondary | ICD-10-CM | POA: Diagnosis not present

## 2015-05-04 LAB — POTASSIUM: Potassium: 3.8 mEq/L (ref 3.5–5.1)

## 2015-05-04 LAB — TSH: TSH: 1.16 u[IU]/mL (ref 0.35–4.50)

## 2015-05-04 LAB — T4, FREE: Free T4: 1.1 ng/dL (ref 0.60–1.60)

## 2015-05-05 ENCOUNTER — Telehealth: Payer: Self-pay | Admitting: Internal Medicine

## 2015-05-05 MED ORDER — DEXAMETHASONE 1 MG PO TABS: ORAL_TABLET | ORAL | Status: DC

## 2015-05-05 NOTE — Telephone Encounter (Signed)
Patient called stating that she would like her Rx called in   Rx: Dexamethasone   Pharmacy: [facility]    Thank you

## 2015-05-05 NOTE — Telephone Encounter (Signed)
Done

## 2015-05-06 ENCOUNTER — Other Ambulatory Visit (INDEPENDENT_AMBULATORY_CARE_PROVIDER_SITE_OTHER): Payer: Medicare Other

## 2015-05-06 DIAGNOSIS — M546 Pain in thoracic spine: Secondary | ICD-10-CM | POA: Diagnosis not present

## 2015-05-06 DIAGNOSIS — D3502 Benign neoplasm of left adrenal gland: Secondary | ICD-10-CM | POA: Diagnosis not present

## 2015-05-06 DIAGNOSIS — M9901 Segmental and somatic dysfunction of cervical region: Secondary | ICD-10-CM | POA: Diagnosis not present

## 2015-05-06 DIAGNOSIS — M542 Cervicalgia: Secondary | ICD-10-CM | POA: Diagnosis not present

## 2015-05-06 DIAGNOSIS — M9902 Segmental and somatic dysfunction of thoracic region: Secondary | ICD-10-CM | POA: Diagnosis not present

## 2015-05-06 DIAGNOSIS — M9903 Segmental and somatic dysfunction of lumbar region: Secondary | ICD-10-CM | POA: Diagnosis not present

## 2015-05-06 DIAGNOSIS — M5030 Other cervical disc degeneration, unspecified cervical region: Secondary | ICD-10-CM | POA: Diagnosis not present

## 2015-05-06 DIAGNOSIS — M5136 Other intervertebral disc degeneration, lumbar region: Secondary | ICD-10-CM | POA: Diagnosis not present

## 2015-05-06 DIAGNOSIS — M5134 Other intervertebral disc degeneration, thoracic region: Secondary | ICD-10-CM | POA: Diagnosis not present

## 2015-05-06 DIAGNOSIS — M545 Low back pain: Secondary | ICD-10-CM | POA: Diagnosis not present

## 2015-05-06 LAB — CORTISOL: Cortisol, Plasma: 1.7 ug/dL

## 2015-05-07 LAB — METANEPHRINES, PLASMA
Metanephrine, Free: <25 pg/mL (ref <=57)
Normetanephrine, Free: 56 pg/mL (ref <=148)
Total Metanephrines-Plasma: 56 pg/mL (ref <=205)

## 2015-05-08 LAB — ALDOSTERONE + RENIN ACTIVITY W/ RATIO
Aldosterone: <1 ng/dL
PRA LC/MS/MS: 1.12 ng/mL/h (ref 0.25–5.82)

## 2015-05-09 DIAGNOSIS — M545 Low back pain: Secondary | ICD-10-CM | POA: Diagnosis not present

## 2015-05-09 DIAGNOSIS — M5136 Other intervertebral disc degeneration, lumbar region: Secondary | ICD-10-CM | POA: Diagnosis not present

## 2015-05-09 DIAGNOSIS — M5030 Other cervical disc degeneration, unspecified cervical region: Secondary | ICD-10-CM | POA: Diagnosis not present

## 2015-05-09 DIAGNOSIS — M9902 Segmental and somatic dysfunction of thoracic region: Secondary | ICD-10-CM | POA: Diagnosis not present

## 2015-05-09 DIAGNOSIS — M9903 Segmental and somatic dysfunction of lumbar region: Secondary | ICD-10-CM | POA: Diagnosis not present

## 2015-05-09 DIAGNOSIS — M542 Cervicalgia: Secondary | ICD-10-CM | POA: Diagnosis not present

## 2015-05-09 DIAGNOSIS — M9901 Segmental and somatic dysfunction of cervical region: Secondary | ICD-10-CM | POA: Diagnosis not present

## 2015-05-09 DIAGNOSIS — M5134 Other intervertebral disc degeneration, thoracic region: Secondary | ICD-10-CM | POA: Diagnosis not present

## 2015-05-09 DIAGNOSIS — M546 Pain in thoracic spine: Secondary | ICD-10-CM | POA: Diagnosis not present

## 2015-05-11 DIAGNOSIS — M5136 Other intervertebral disc degeneration, lumbar region: Secondary | ICD-10-CM | POA: Diagnosis not present

## 2015-05-11 DIAGNOSIS — M5134 Other intervertebral disc degeneration, thoracic region: Secondary | ICD-10-CM | POA: Diagnosis not present

## 2015-05-11 DIAGNOSIS — M9903 Segmental and somatic dysfunction of lumbar region: Secondary | ICD-10-CM | POA: Diagnosis not present

## 2015-05-11 DIAGNOSIS — M9902 Segmental and somatic dysfunction of thoracic region: Secondary | ICD-10-CM | POA: Diagnosis not present

## 2015-05-11 DIAGNOSIS — M5030 Other cervical disc degeneration, unspecified cervical region: Secondary | ICD-10-CM | POA: Diagnosis not present

## 2015-05-11 DIAGNOSIS — M545 Low back pain: Secondary | ICD-10-CM | POA: Diagnosis not present

## 2015-05-11 DIAGNOSIS — M546 Pain in thoracic spine: Secondary | ICD-10-CM | POA: Diagnosis not present

## 2015-05-11 DIAGNOSIS — M9901 Segmental and somatic dysfunction of cervical region: Secondary | ICD-10-CM | POA: Diagnosis not present

## 2015-05-11 DIAGNOSIS — M542 Cervicalgia: Secondary | ICD-10-CM | POA: Diagnosis not present

## 2015-05-13 ENCOUNTER — Telehealth: Payer: Self-pay | Admitting: Internal Medicine

## 2015-05-13 DIAGNOSIS — M542 Cervicalgia: Secondary | ICD-10-CM | POA: Diagnosis not present

## 2015-05-13 DIAGNOSIS — M9902 Segmental and somatic dysfunction of thoracic region: Secondary | ICD-10-CM | POA: Diagnosis not present

## 2015-05-13 DIAGNOSIS — M546 Pain in thoracic spine: Secondary | ICD-10-CM | POA: Diagnosis not present

## 2015-05-13 DIAGNOSIS — M5136 Other intervertebral disc degeneration, lumbar region: Secondary | ICD-10-CM | POA: Diagnosis not present

## 2015-05-13 DIAGNOSIS — M5134 Other intervertebral disc degeneration, thoracic region: Secondary | ICD-10-CM | POA: Diagnosis not present

## 2015-05-13 DIAGNOSIS — M9903 Segmental and somatic dysfunction of lumbar region: Secondary | ICD-10-CM | POA: Diagnosis not present

## 2015-05-13 DIAGNOSIS — M9901 Segmental and somatic dysfunction of cervical region: Secondary | ICD-10-CM | POA: Diagnosis not present

## 2015-05-13 DIAGNOSIS — M5030 Other cervical disc degeneration, unspecified cervical region: Secondary | ICD-10-CM | POA: Diagnosis not present

## 2015-05-13 DIAGNOSIS — M545 Low back pain: Secondary | ICD-10-CM | POA: Diagnosis not present

## 2015-05-13 NOTE — Telephone Encounter (Signed)
Her request for diabetic shoes will be forwrard to Dr Jacqualyn Posey, her podiatrist msince he wrote her the prescriptio for them,  I do not have a visit for DM with a coumented foot exam in the last 6 months which is requried,  And I do not write for diabetic shoes

## 2015-05-13 NOTE — Telephone Encounter (Signed)
Spoke with patient, she was fitted for the shoes the other day, they have been ordered already.    [place] is trying to have a second pair ordered.

## 2015-05-16 ENCOUNTER — Telehealth: Payer: Self-pay | Admitting: Internal Medicine

## 2015-05-16 DIAGNOSIS — M9903 Segmental and somatic dysfunction of lumbar region: Secondary | ICD-10-CM | POA: Diagnosis not present

## 2015-05-16 DIAGNOSIS — M545 Low back pain: Secondary | ICD-10-CM | POA: Diagnosis not present

## 2015-05-16 DIAGNOSIS — M5030 Other cervical disc degeneration, unspecified cervical region: Secondary | ICD-10-CM | POA: Diagnosis not present

## 2015-05-16 DIAGNOSIS — M546 Pain in thoracic spine: Secondary | ICD-10-CM | POA: Diagnosis not present

## 2015-05-16 DIAGNOSIS — M5134 Other intervertebral disc degeneration, thoracic region: Secondary | ICD-10-CM | POA: Diagnosis not present

## 2015-05-16 DIAGNOSIS — M9901 Segmental and somatic dysfunction of cervical region: Secondary | ICD-10-CM | POA: Diagnosis not present

## 2015-05-16 DIAGNOSIS — M9902 Segmental and somatic dysfunction of thoracic region: Secondary | ICD-10-CM | POA: Diagnosis not present

## 2015-05-16 DIAGNOSIS — M542 Cervicalgia: Secondary | ICD-10-CM | POA: Diagnosis not present

## 2015-05-16 DIAGNOSIS — M5136 Other intervertebral disc degeneration, lumbar region: Secondary | ICD-10-CM | POA: Diagnosis not present

## 2015-05-16 NOTE — Telephone Encounter (Signed)
Pt called about her prescription for her diabetic shoes. Pt states Dr [name] wrote a prescription for her diabetic shoes last year. Pt states she is going out of town and she needs to pick up today. Thank You!

## 2015-05-16 NOTE — Telephone Encounter (Signed)
Kathy please advise.

## 2015-05-16 NOTE — Telephone Encounter (Signed)
Left message to call office

## 2015-05-16 NOTE — Telephone Encounter (Signed)
See previous note handled by RN.

## 2015-05-18 ENCOUNTER — Ambulatory Visit: Payer: Medicare Other | Admitting: Neurology

## 2015-05-18 DIAGNOSIS — M542 Cervicalgia: Secondary | ICD-10-CM | POA: Diagnosis not present

## 2015-05-18 DIAGNOSIS — M9901 Segmental and somatic dysfunction of cervical region: Secondary | ICD-10-CM | POA: Diagnosis not present

## 2015-05-18 DIAGNOSIS — M545 Low back pain: Secondary | ICD-10-CM | POA: Diagnosis not present

## 2015-05-18 DIAGNOSIS — M5136 Other intervertebral disc degeneration, lumbar region: Secondary | ICD-10-CM | POA: Diagnosis not present

## 2015-05-18 DIAGNOSIS — M5134 Other intervertebral disc degeneration, thoracic region: Secondary | ICD-10-CM | POA: Diagnosis not present

## 2015-05-18 DIAGNOSIS — M9902 Segmental and somatic dysfunction of thoracic region: Secondary | ICD-10-CM | POA: Diagnosis not present

## 2015-05-18 DIAGNOSIS — M9903 Segmental and somatic dysfunction of lumbar region: Secondary | ICD-10-CM | POA: Diagnosis not present

## 2015-05-18 DIAGNOSIS — M546 Pain in thoracic spine: Secondary | ICD-10-CM | POA: Diagnosis not present

## 2015-05-18 DIAGNOSIS — M5030 Other cervical disc degeneration, unspecified cervical region: Secondary | ICD-10-CM | POA: Diagnosis not present

## 2015-05-19 NOTE — Telephone Encounter (Signed)
Patient will have to make an appt to discuss .  I NEVER  Write for diabetic shoes bc there is no data that they do anything for the patient .

## 2015-05-19 NOTE — Telephone Encounter (Signed)
Pt states that her PCP has to write the Rx for the orthopedic shoes. Dr. Milinda Pointer originally written it & they can not accept it from his office. We should have a copy of the questionaire that has to be faxed along with the Rx. Pt seems to be irritated from having to explain this so much. Please call pt @ [phone] if you have any further questions.

## 2015-05-20 NOTE — Telephone Encounter (Signed)
[name] followed up on this already with [name].

## 2015-05-22 ENCOUNTER — Ambulatory Visit (HOSPITAL_BASED_OUTPATIENT_CLINIC_OR_DEPARTMENT_OTHER): Payer: Medicare Other | Attending: Internal Medicine

## 2015-05-22 VITALS — Ht 64.5 in | Wt 227.0 lb

## 2015-05-22 DIAGNOSIS — G4730 Sleep apnea, unspecified: Secondary | ICD-10-CM | POA: Diagnosis present

## 2015-05-22 DIAGNOSIS — G4710 Hypersomnia, unspecified: Secondary | ICD-10-CM

## 2015-05-22 DIAGNOSIS — I493 Ventricular premature depolarization: Secondary | ICD-10-CM | POA: Insufficient documentation

## 2015-05-22 DIAGNOSIS — G4733 Obstructive sleep apnea (adult) (pediatric): Secondary | ICD-10-CM | POA: Diagnosis not present

## 2015-05-22 DIAGNOSIS — R0683 Snoring: Secondary | ICD-10-CM | POA: Insufficient documentation

## 2015-05-23 ENCOUNTER — Ambulatory Visit (HOSPITAL_BASED_OUTPATIENT_CLINIC_OR_DEPARTMENT_OTHER): Payer: Medicare Other | Attending: Internal Medicine | Admitting: Radiology

## 2015-05-23 VITALS — Ht 64.0 in | Wt 227.0 lb

## 2015-05-23 DIAGNOSIS — G4710 Hypersomnia, unspecified: Secondary | ICD-10-CM | POA: Insufficient documentation

## 2015-05-23 DIAGNOSIS — G4711 Idiopathic hypersomnia with long sleep time: Secondary | ICD-10-CM | POA: Diagnosis not present

## 2015-05-23 DIAGNOSIS — G4733 Obstructive sleep apnea (adult) (pediatric): Secondary | ICD-10-CM

## 2015-05-23 DIAGNOSIS — G4719 Other hypersomnia: Secondary | ICD-10-CM

## 2015-05-23 NOTE — Telephone Encounter (Signed)
Patient called regarding her diabetic shoes.  Patient stated that Dr. Jacqualyn Posey gave the prescription for the actual shoes, but patient needs an additional prescription for the shoes to submit to medicare to cover the 500 cost.  Medicare needs the script to be from the provider that follows her diabetes.  I reviewed prior messages with the patient regarding this same issue.  Patient was very demanding stating that she will call at 230 today and needs one of two things,  1. A script from [name] for the shoes 2. An appointment for Tuesday after ten or Friday so that she has a script in hand to take to the Center for orthopedic shoes.    I explained that [name] is seeing patients and that I will gladly send the message but that she might not respond by two thirty today, but I would call by the end of business day.  She was very upset and stated "[name] has been my doctor for years and I don't understand why this is a simple request. This is urgent, she has left a visit with me before.  I have been waiting over a week for this."   I then discussed that she had responded initially and denied her filling out the forms on the 28th and that her podiatrist needed to complete them as he was seeing her every 3 months and completing foot exams.    She then stated she would call back later.    Please advise.

## 2015-05-23 NOTE — Telephone Encounter (Signed)
I do not have appts available at the time she requests.  Next Monday is the earliest I can see her.

## 2015-05-24 NOTE — Telephone Encounter (Signed)
I scheduled her for Monday at 4pm, she verbalized understanding for appointment.

## 2015-05-25 DIAGNOSIS — M5134 Other intervertebral disc degeneration, thoracic region: Secondary | ICD-10-CM | POA: Diagnosis not present

## 2015-05-25 DIAGNOSIS — M9903 Segmental and somatic dysfunction of lumbar region: Secondary | ICD-10-CM | POA: Diagnosis not present

## 2015-05-25 DIAGNOSIS — M545 Low back pain: Secondary | ICD-10-CM | POA: Diagnosis not present

## 2015-05-25 DIAGNOSIS — M5030 Other cervical disc degeneration, unspecified cervical region: Secondary | ICD-10-CM | POA: Diagnosis not present

## 2015-05-25 DIAGNOSIS — M9902 Segmental and somatic dysfunction of thoracic region: Secondary | ICD-10-CM | POA: Diagnosis not present

## 2015-05-25 DIAGNOSIS — M546 Pain in thoracic spine: Secondary | ICD-10-CM | POA: Diagnosis not present

## 2015-05-25 DIAGNOSIS — M9901 Segmental and somatic dysfunction of cervical region: Secondary | ICD-10-CM | POA: Diagnosis not present

## 2015-05-25 DIAGNOSIS — M542 Cervicalgia: Secondary | ICD-10-CM | POA: Diagnosis not present

## 2015-05-25 DIAGNOSIS — M5136 Other intervertebral disc degeneration, lumbar region: Secondary | ICD-10-CM | POA: Diagnosis not present

## 2015-05-26 ENCOUNTER — Ambulatory Visit: Payer: Medicare Other

## 2015-05-26 ENCOUNTER — Ambulatory Visit (INDEPENDENT_AMBULATORY_CARE_PROVIDER_SITE_OTHER): Payer: Medicare Other | Admitting: Podiatry

## 2015-05-26 DIAGNOSIS — L03032 Cellulitis of left toe: Secondary | ICD-10-CM | POA: Diagnosis not present

## 2015-05-26 DIAGNOSIS — B351 Tinea unguium: Secondary | ICD-10-CM

## 2015-05-26 DIAGNOSIS — M79676 Pain in unspecified toe(s): Secondary | ICD-10-CM

## 2015-05-26 MED ORDER — SULFAMETHOXAZOLE-TRIMETHOPRIM 800-160 MG PO TABS: 1.0000 | ORAL_TABLET | Freq: Two times a day (BID) | ORAL | Status: DC

## 2015-05-26 NOTE — Progress Notes (Signed)
Subjective: 69 y.o. returns the office today for painful, elongated, thickened toenails which she is unable to trim herself. Denies any redness or drainage around the nails. She states that she is having that her primary care physician in order to have a foot exam to have diabetic shoes approves. She states that over the last week she has noticed some redness with topical her left big toe as she had a blister form. She has notices, redness around this area. She denies any drainage or pus and denies any red streaking. Denies any acute changes since last appointment and no new complaints today. Denies any systemic complaints such as fevers, chills, nausea, vomiting.   Objective: AAO 3, NAD DP/PT pulses palpable, CRT less than 3 seconds Protective sensation decreased with Simms Weinstein monofilament, Achilles tendon reflex intact.  Nails hypertrophic, dystrophic, elongated, brittle, discolored 10. There is tenderness overlying the nails 1-5 bilaterally. There is no surrounding erythema or drainage along the nail sites. Multiple digital contractures are present. There is prominence of the fifth metatarsal base on left side. A scar is overlying lateral aspect left foot over an area of prior ulceration in the fifth toe appears to be somewhat floppy. 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 At the dorsal aspect the left hallux IPJ appears to be an area of the drained bulla and there is a small rim of erythema around this lesion without any ascending cellulitis. There is no fluctuance or crepitus. No malodor. No drainage or purulence. No other open lesions or pre-ulcer lesions identified at this time. No other areas of tenderness bilateral lower extremities. No overlying edema, erythema, increased warmth. No pain with calf compression, swelling, warmth, erythema.  Assessment: Patient presents with symptomatic onychomycosis; left dorsal hallux IPJ resolving bulla with localized  erythema  Plan: -Treatment options including alternatives, risks, complications were discussed -Nails sharply debrided 10 without complication/bleeding. -Prescribed Bactrim (she has previously taken this without any issues) due to the erythema. Continue with antibiotic ointment and a bandage to the area daily. Monitor for any skin breakdown and call the office if this occurs or worsens. Monitor for any clinical signs or symptoms of infection and directed to call the office immediately should any occur or go to the ER.  -Discussed daily foot inspection. If there are any changes, to call the office immediately.  -Follow-up in 2 weeks or sooner if any problems are to arise. In the meantime, encouraged to call the office with any questions, concerns, changes symptoms.  Celesta Gentile, DPM

## 2015-05-27 DIAGNOSIS — M546 Pain in thoracic spine: Secondary | ICD-10-CM | POA: Diagnosis not present

## 2015-05-27 DIAGNOSIS — M5136 Other intervertebral disc degeneration, lumbar region: Secondary | ICD-10-CM | POA: Diagnosis not present

## 2015-05-27 DIAGNOSIS — M9901 Segmental and somatic dysfunction of cervical region: Secondary | ICD-10-CM | POA: Diagnosis not present

## 2015-05-27 DIAGNOSIS — M5030 Other cervical disc degeneration, unspecified cervical region: Secondary | ICD-10-CM | POA: Diagnosis not present

## 2015-05-27 DIAGNOSIS — M9902 Segmental and somatic dysfunction of thoracic region: Secondary | ICD-10-CM | POA: Diagnosis not present

## 2015-05-27 DIAGNOSIS — M9903 Segmental and somatic dysfunction of lumbar region: Secondary | ICD-10-CM | POA: Diagnosis not present

## 2015-05-27 DIAGNOSIS — M5134 Other intervertebral disc degeneration, thoracic region: Secondary | ICD-10-CM | POA: Diagnosis not present

## 2015-05-27 DIAGNOSIS — M542 Cervicalgia: Secondary | ICD-10-CM | POA: Diagnosis not present

## 2015-05-27 DIAGNOSIS — M545 Low back pain: Secondary | ICD-10-CM | POA: Diagnosis not present

## 2015-05-28 DIAGNOSIS — G4733 Obstructive sleep apnea (adult) (pediatric): Secondary | ICD-10-CM | POA: Diagnosis not present

## 2015-05-28 DIAGNOSIS — G4719 Other hypersomnia: Secondary | ICD-10-CM | POA: Diagnosis not present

## 2015-05-28 DIAGNOSIS — G4710 Hypersomnia, unspecified: Secondary | ICD-10-CM | POA: Diagnosis not present

## 2015-05-28 NOTE — Progress Notes (Signed)
   Patient Name: Denise Sharp, Denise Sharp Date: 05/23/2015 Gender: Female D.O.B: 1945-07-23 Age (years): 87 Referring Provider: Baird Lyons MD, ABSM Height (inches): 65 Interpreting Physician: Baird Lyons MD, ABSM Weight (lbs): 227 RPSGT: Jacolyn Reedy BMI: 38 MRN: [id number] Neck Size: 14.50 CLINICAL INFORMATION Sleep Study Type: MSLT   The patient was referred to the sleep center for evaluation of daytime sleepiness.   Epworth Sleepiness Score: 15 Diagnostic NPSG 08/14/12, AHI 13.2/ hr,  Total Sleep Time 434.9 minutes/ 14.8% REM  SLEEP STUDY TECHNIQUE A Multiple Sleep Latency Test was performed after an overnight polysomnogram according to the AASM scoring manual v2.3 (April 2016) and clinical guidelines. Five nap opportunities occurred over the course of the test which followed an overnight polysomnogram. The channels recorded and monitored were frontal, central, and occipital electroencephalography (EEG), right and left electrooculogram (EOG), chin electromyography (EMG), and electrocardiogram (EKG).  MEDICATIONS Medications taken by the patient : charted for review Medications administered by patient during sleep study : Lyrica and "meds for RLS"  IMPRESSIONS - Total number of naps attempted: 5 . Total number of naps with sleep attained: 5. The Mean Sleep Latency was 00:01:23 minutes. There were 2 sleep-onset REM periods. - The patient appears to have pathologic sleepiness, evidenced by a short mean sleep latency (8 minutes or less) on this MSLT. - 2 sleep onset REMs were noted during this MSLT. - Pathologic degree of daytime sleepiness is present, with mean sleep latency less than 8 minutes and REM on 2/ 5 naps. These scores would be consistent with sustained sleep deprivation, narcolepsy or idiopathic hypersomnia. The study protocol was not followed, and thus a firm diagnosis of narcolepsy can not be made, because the patient took sleep and REM-influencing medications  (Lyrica and meds for RLS) the night before. That night was recorded as a CPAP titration study with 367.5 minutes sleep/ 88.1% sleep efficiency/ 37 minutes/ 10.1% REM. The current MSLT was performed with the patient wearing CPAP 7 cmw.  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 Narcolepsy - Return for follow up to evaluate other causes of excessive daytime sleepiness.  Deneise Lever Diplomate, American Board of Sleep Medicine  ELECTRONICALLY SIGNED ON:  05/28/2015, 10:34 AM [address] PH: [phone]   FX: [phone] [place]

## 2015-05-28 NOTE — Progress Notes (Signed)
  Patient Name: Denise Sharp, Denise Sharp Date: 05/22/2015 Gender: Female D.O.B: May 05, 1946 Age (years): 67 Referring Provider: Baird Lyons MD, ABSM Height (inches): 65 Interpreting Physician: Baird Lyons MD, ABSM Weight (lbs): 227 RPSGT: Joni Reining BMI: 42 MRN: [id number] Neck Size: 14.50 CLINICAL INFORMATION The patient is referred for a CPAP titration to treat sleep apnea. This study is directly followed by an MSLT   Date of NPSG, Split Night or HST:  Diagnostic NPSG 08/14/12  AHI 13.2/ hr, Total Sleep Time 434.9 minutes/ 14.8% REM, body weight 218 lbs  SLEEP STUDY TECHNIQUE As per the AASM Manual for the Scoring of Sleep and Associated Events v2.3 (April 2016) with a hypopnea requiring 4% desaturations. The channels recorded and monitored were frontal, central and occipital EEG, electrooculogram (EOG), submentalis EMG (chin), nasal and oral airflow, thoracic and abdominal wall motion, anterior tibialis EMG, snore microphone, electrocardiogram, and pulse oximetry. Continuous positive airway pressure (CPAP) was initiated at the beginning of the study and titrated to treat sleep-disordered breathing.  MEDICATIONS Medications taken by the patient : charted for review Medications administered by patient during sleep study :Patient took Lyrica and "meds for RLS" prior to the study  TECHNICIAN COMMENTS Comments added by technician: NONE  Comments added by scorer: N/A  RESPIRATORY PARAMETERS Optimal PAP Pressure (cm): 7 AHI at Optimal Pressure (/hr): 0.0 Overall Minimal O2 (%): 91.00 Supine % at Optimal Pressure (%): 0 Minimal O2 at Optimal Pressure (%): 94.0    SLEEP ARCHITECTURE The study was initiated at 11:03:28 PM and ended at 6:00:28 AM. Sleep onset time was 2.5 minutes and the sleep efficiency was 88.1%. The total sleep time was 367.5 minutes. The patient spent 2.31% of the night in stage N1 sleep, 87.62% in stage N2 sleep, 0.00% in stage N3 and 10.07% in REM.Stage REM  latency was 132.5 minutes Wake after sleep onset was 47.0 minutes. Alpha intrusion was absent. Supine sleep was 70.21%.  CARDIAC DATA The 2 lead EKG demonstrated sinus rhythm. The mean heart rate was 59.41 beats per minute. Other EKG findings include: PVCs.  LEG MOVEMENT DATA The total Periodic Limb Movements of Sleep (PLMS) were 0. The PLMS index was 0.00. A PLMS index of <15 is considered normal in adults.  IMPRESSIONS - The optimal PAP pressure was 7 cm of water. - Central sleep apnea was not noted during this titration (CAI = 0.0/h). - Significant oxygen desaturations were not observed during this titration (min O2 = 91.00%). - The patient snored with Moderate snoring volume during this titration study. - 2-lead EKG demonstrated: PVCs - Clinically significant periodic limb movements were not noted during this study. Arousals associated with PLMs were rare.  DIAGNOSIS - Obstructive Sleep Apnea (327.23 [G47.33 ICD-10])  RECOMMENDATIONS - Trial of CPAP therapy on 7 cm H2O with a Small size Fisher&Paykel Nasal Mask Eson mask and heated humidification. - Avoid alcohol, sedatives and other CNS depressants that may worsen sleep apnea and disrupt normal sleep architecture. - Sleep hygiene should be reviewed to assess factors that may improve sleep quality. - Weight management and regular exercise should be initiated or continued.   Deneise Lever Diplomate, American Board of Sleep Medicine  ELECTRONICALLY SIGNED ON:  05/28/2015, 10:23 AM [place] PH: [phone]   FX: [phone] [place]

## 2015-05-29 DIAGNOSIS — G4710 Hypersomnia, unspecified: Secondary | ICD-10-CM | POA: Diagnosis not present

## 2015-05-29 DIAGNOSIS — G4733 Obstructive sleep apnea (adult) (pediatric): Secondary | ICD-10-CM | POA: Diagnosis not present

## 2015-05-29 DIAGNOSIS — G4719 Other hypersomnia: Secondary | ICD-10-CM | POA: Diagnosis not present

## 2015-05-30 ENCOUNTER — Encounter: Payer: Self-pay | Admitting: Internal Medicine

## 2015-05-30 ENCOUNTER — Ambulatory Visit (INDEPENDENT_AMBULATORY_CARE_PROVIDER_SITE_OTHER): Payer: Medicare Other | Admitting: Internal Medicine

## 2015-05-30 VITALS — BP 119/70 | HR 70 | Temp 98.7°F | Resp 16 | Wt 229.8 lb

## 2015-05-30 DIAGNOSIS — I10 Essential (primary) hypertension: Secondary | ICD-10-CM | POA: Diagnosis not present

## 2015-05-30 DIAGNOSIS — M5134 Other intervertebral disc degeneration, thoracic region: Secondary | ICD-10-CM | POA: Diagnosis not present

## 2015-05-30 DIAGNOSIS — E038 Other specified hypothyroidism: Secondary | ICD-10-CM

## 2015-05-30 DIAGNOSIS — M9901 Segmental and somatic dysfunction of cervical region: Secondary | ICD-10-CM | POA: Diagnosis not present

## 2015-05-30 DIAGNOSIS — E1142 Type 2 diabetes mellitus with diabetic polyneuropathy: Secondary | ICD-10-CM

## 2015-05-30 DIAGNOSIS — M9902 Segmental and somatic dysfunction of thoracic region: Secondary | ICD-10-CM | POA: Diagnosis not present

## 2015-05-30 DIAGNOSIS — E034 Atrophy of thyroid (acquired): Secondary | ICD-10-CM | POA: Diagnosis not present

## 2015-05-30 DIAGNOSIS — M546 Pain in thoracic spine: Secondary | ICD-10-CM | POA: Diagnosis not present

## 2015-05-30 DIAGNOSIS — M5136 Other intervertebral disc degeneration, lumbar region: Secondary | ICD-10-CM | POA: Diagnosis not present

## 2015-05-30 DIAGNOSIS — M545 Low back pain: Secondary | ICD-10-CM | POA: Diagnosis not present

## 2015-05-30 DIAGNOSIS — M542 Cervicalgia: Secondary | ICD-10-CM | POA: Diagnosis not present

## 2015-05-30 DIAGNOSIS — M9903 Segmental and somatic dysfunction of lumbar region: Secondary | ICD-10-CM | POA: Diagnosis not present

## 2015-05-30 DIAGNOSIS — M5030 Other cervical disc degeneration, unspecified cervical region: Secondary | ICD-10-CM | POA: Diagnosis not present

## 2015-05-30 NOTE — Patient Instructions (Addendum)
I f  you want to confirm norrmal thyroid,  suspend your thyroid medication  Until  Dec   1  and have your TSH repeated then  We will check your A1c and lipids then

## 2015-05-30 NOTE — Progress Notes (Signed)
Pre visit review using our clinic review tool, if applicable. No additional management support is needed unless otherwise documented below in the visit note. 

## 2015-05-30 NOTE — Progress Notes (Signed)
Subjective:  Patient ID: Denise Sharp, female    DOB: 07-17-1945  Age: 69 y.o. MRN: [id number]  CC: The primary encounter diagnosis was DM type 2 with diabetic peripheral neuropathy ([place]). Diagnoses of Essential hypertension, Hypothyroidism due to acquired atrophy of thyroid, and Diabetic polyneuropathy associated with type 2 diabetes mellitus ([place]) were also pertinent to this visit.  HPI [name] presents for follow up on type 2 DM with neuropathy.  She was last seen for Diabetes followup  In August at which time an A1c was due but not done.  She is currently taking Januvia and glimepiride was substituted for glipizide due to intolerance in the form of recurrent nausea.  The appt today was made urgently today because she has requires an order for a new pair of custom made therapeutic shoes.  She has diabetic neuropathy  With decreased sensation ,  Present for many years,  Which as resulted in deformities including digital contractures and hallux IPJ arthritis and hallux malleus.  She has a a history of ulcerations  And is currently rundergoing treatment for an infected bullous by her podiatrist. Dr Jacqualyn Posey.  She has been wearing the current pair of therapeutic diabetic shoes for  18 months and needs another pair    Outpatient Prescriptions Prior to Visit  Medication Sig Dispense Refill  . albuterol (PROVENTIL HFA;VENTOLIN HFA) 108 (90 BASE) MCG/ACT inhaler Inhale 2 puffs into the lungs every 6 (six) hours as needed for wheezing or shortness of breath. 2 Inhaler 3  . Blood Pressure Monitoring (BLOOD PRESSURE MONITOR/ARM) DEVI Automatic BP machine.  Normal adult size arm 1 Device 0  . Cholecalciferol (VITAMIN D3) 10000 UNITS capsule Take 10,000 Units by mouth once a week.    . cyanocobalamin (,VITAMIN B-12,) 1000 MCG/ML injection 1000 ml (1 ml) injection twice monthly 25 mL 3  . dexamethasone (DECADRON) 1 MG tablet Take 1 tablet by mouth once at 11 pm, before coming for labs at 8 am the  next morning 1 tablet 0  . diazepam (VALIUM) 10 MG tablet Take 1 tablet (10 mg total) by mouth at bedtime as needed for anxiety. 90 tablet 1  . diphenoxylate-atropine (LOMOTIL) 2.5-0.025 MG per tablet Take 1-2 tablets by mouth 4 (four) times daily as needed for diarrhea or loose stools. 240 tablet 2  . estradiol (ESTRACE) 0.5 MG tablet Take 1 tablet (0.5 mg total) by mouth daily. 90 tablet 2  . eszopiclone (LUNESTA) 2 MG TABS tablet Take 1 tablet (2 mg total) by mouth at bedtime. Take immediately before bedtime 90 tablet 1  . fluticasone (FLONASE) 50 MCG/ACT nasal spray Place 2 sprays into both nostrils daily. 16 g 6  . furosemide (LASIX) 20 MG tablet Take 1 tablet (20 mg total) by mouth daily. 90 tablet 3  . glimepiride (AMARYL) 2 MG tablet Take 1 tablet (2 mg total) by mouth daily with breakfast. 90 tablet 1  . Glucose Blood (BAYER BREEZE 2 TEST) DISK Use one test strip each time glucose needs testing. 50 each 2  . hydrALAZINE (APRESOLINE) 25 MG tablet Take 1 tablet (25 mg total) by mouth 3 (three) times daily. As needed for bp > 160 270 tablet 1  . hydrocodone-acetaminophen (LORCET-HD) 5-500 MG per capsule Take 1 capsule by mouth every 6 (six) hours as needed. 30 capsule 3  . levothyroxine (SYNTHROID, LEVOTHROID) 50 MCG tablet Take 1 tablet (50 mcg total) by mouth daily. 90 tablet 3  . nebivolol (BYSTOLIC) 5 MG tablet Take 1  tablet (5 mg total) by mouth daily. 90 tablet 1  . nystatin (MYCOSTATIN) powder Apply topically 2 (two) times daily. 60 g 3  . oxyCODONE-acetaminophen (PERCOCET/ROXICET) 5-325 MG per tablet Take 1 tablet by mouth every 6 (six) hours as needed. 30 tablet 0  . pregabalin (LYRICA) 50 MG capsule Take 1 tablet in the morning and 2 tablets in the evening. 90 capsule 2  . rOPINIRole (REQUIP) 1 MG tablet Take 1 tablet (1 mg total) by mouth at bedtime. 90 tablet 2  . sitaGLIPtin (JANUVIA) 100 MG tablet Take 1 tablet (100 mg total) by mouth daily. 90 tablet 2  .  sulfamethoxazole-trimethoprim (BACTRIM DS,SEPTRA DS) 800-160 MG tablet Take 1 tablet by mouth 2 (two) times daily. 20 tablet 0  . benzonatate (TESSALON) 200 MG capsule Take 1 capsule (200 mg total) by mouth 3 (three) times daily as needed for cough. (Patient not taking: Reported on 05/30/2015) 180 capsule 1  . traMADol (ULTRAM) 50 MG tablet Take 50 mg by mouth 4 (four) times daily as needed.     No facility-administered medications prior to visit.    Review of Systems;  Patient denies headache, fevers, malaise, unintentional weight loss, skin rash, eye pain, sinus congestion and sinus pain, sore throat, dysphagia,  hemoptysis , cough, dyspnea, wheezing, chest pain, palpitations, orthopnea, edema, abdominal pain, nausea, melena, diarrhea, constipation, flank pain, dysuria, hematuria, urinary  Frequency, nocturia, numbness, tingling, seizures,  Focal weakness, Loss of consciousness,  Tremor, insomnia, depression, anxiety, and suicidal ideation.      Objective:  BP 119/70 mmHg  Pulse 70  Temp(Src) 98.7 F (37.1 C)  Resp 16  Wt 229 lb 12.8 oz (104.237 kg)  SpO2 97%  BP Readings from Last 3 Encounters:  05/30/15 119/70  05/03/15 132/80  03/08/15 124/60    Wt Readings from Last 3 Encounters:  05/30/15 229 lb 12.8 oz (104.237 kg)  05/23/15 227 lb (102.967 kg)  05/22/15 227 lb (102.967 kg)    General appearance: alert, cooperative and appears stated age Ears: normal TM's and external ear canals both ears Throat: lips, mucosa, and tongue normal; teeth and gums normal Neck: no adenopathy, no carotid bruit, supple, symmetrical, trachea midline and thyroid not enlarged, symmetric, no tenderness/mass/nodules Back: symmetric, no curvature. ROM normal. No CVA tenderness. Lungs: clear to auscultation bilaterally Heart: regular rate and rhythm, S1, S2 normal, no murmur, click, rub or gallop Abdomen: soft, non-tender; bowel sounds normal; no masses,  no organomegaly Pulses: 2+ and  symmetric Skin: Skin color, texture, turgor normal. No rashes or lesions Lymph nodes: Cervical, supraclavicular, and axillary nodes normal. Foot exam:  Nails are dystrophic , hypertrophic and elongated. There is tenderness overlying the nails 1-5 bilaterally. There is no surrounding erythema or drainage along the nail sites. Multiple digital contractures are present. There is prominence of the fifth metatarsal base on left side. A scar is overlying lateral aspect left foot over an area of prior ulceration in the fifth toe appears to be somewhat floppy. 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 At the dorsal aspect the left hallux IPJ appears to be an area of the drained bulla and there is a small rim of erythema around this lesion without any ascending cellulitis. There is no fluctuance or crepitus. No malodor. No drainage or purulence.   Lab Results  Component Value Date   HGBA1C 6.8* 08/13/2014   HGBA1C 7.0* 03/31/2014   HGBA1C 7.2* 12/28/2013    Lab Results  Component Value Date   CREATININE  0.9 10/20/2014   CREATININE 0.84 08/13/2014   CREATININE 1.02 06/21/2014    Lab Results  Component Value Date   WBC 9.2 10/20/2014   HGB 11.7* 10/20/2014   HCT 38.4 08/13/2014   PLT 302 10/20/2014   GLUCOSE 103* 08/13/2014   CHOL 205* 03/31/2014   TRIG 158.0* 03/31/2014   HDL 48.60 03/31/2014   LDLCALC 125* 03/31/2014   ALT 11 08/13/2014   AST 15 08/13/2014   NA 143 10/20/2014   K 3.8 05/03/2015   CL 105 08/13/2014   CREATININE 0.9 10/20/2014   BUN 11 10/20/2014   CO2 25 08/13/2014   TSH 1.16 05/03/2015   HGBA1C 6.8* 08/13/2014   MICROALBUR 6.5* 03/31/2014    Ct Chest Wo Contrast  04/05/2015  CLINICAL DATA:  69 year old female presenting for follow-up of pulmonary nodules. Patient reports shortness of breath, chest pain and cough for 2-3 months. EXAM: CT CHEST WITHOUT CONTRAST TECHNIQUE: Multidetector CT imaging of the chest was performed following the standard protocol  without IV contrast. COMPARISON:  03/31/2014 chest CT. FINDINGS: Mediastinum/Nodes: Normal heart size. No pericardial fluid/thickening. There is atherosclerosis of the thoracic aorta, the great vessels of the mediastinum and the coronary arteries, including calcified atherosclerotic plaque in the left anterior descending, left circumflex and right coronary arteries. Great vessels are normal in course and caliber. Normal visualized thyroid. Normal esophagus. No axillary, mediastinal or gross hilar lymphadenopathy, noting limited sensitivity for the detection of hilar adenopathy on this noncontrast study. Lungs/Pleura: No pneumothorax. No pleural effusion. No acute consolidative airspace disease. There is a 3 mm basilar right upper lobe solid pulmonary nodule (series 4/image 27 and better seen on coronal series 5/image 74), unchanged since 02/27/2013. Left upper lobe 3 mm ground-glass pulmonary nodule (4/19), unchanged since 02/27/2013. There are adjacent 1.0 x 0.9 cm and 0.9 x 0.8 cm left upper lobe ground-glass pulmonary nodules (4/12) without appreciable solid components, unchanged in size and appearance since 02/27/2013. No new significant pulmonary nodules. Upper abdomen: Status post cholecystectomy. Stable postsurgical changes status post gastric bypass surgery. Left adrenal 1.4 cm adenoma, stable since 02/27/2013. Musculoskeletal: Moderate degenerative changes in the thoracic spine. No aggressive appearing focal osseous lesions. IMPRESSION: 1. Three left upper lobe ground-glass pulmonary nodules, for which 25 month stability has been demonstrated. A follow-up chest CT in 12 months is recommended to document 36 month stability. This recommendation follows the consensus statement: Recommendations for the Management of Subsolid Pulmonary Nodules Detected at CT: A Statement from the [employer] as published in Radiology 2013; 266:304-317. 2. Tiny 3 mm right upper lobe solid pulmonary nodule, for which 25  month stability has been demonstrated. No further follow-up is required. 3. No acute pulmonary disease. 4. Atherosclerosis, including three-vessel coronary artery disease. Please note that although the presence of coronary artery calcium documents the presence of coronary artery disease, the severity of this disease and any potential stenosis cannot be assessed on this non-gated CT examination. Assessment for potential risk factor modification, dietary therapy or pharmacologic therapy may be warranted, if clinically indicated. 5. Stable left adrenal adenoma. Electronically Signed   By: Ilona Sorrel M.D.   On: 04/05/2015 12:59    Assessment & Plan:   Problem List Items Addressed This Visit    Diabetic polyneuropathy ([place]) (Chronic)    Resulting in foot deformity and recurrent neuropathic ulcerations, managed by Dr Jacqualyn Posey. .  Prescription for custom made therapeutic shoes has been completed and given to patient.       DM type 2 with  diabetic peripheral neuropathy ([facility]) - Primary    Sugars are currently well controlled on current regimen.  She will return for fasting labs in 2 weeks.   Lab Results  Component Value Date   HGBA1C 6.8* 08/13/2014   Lab Results  Component Value Date   MICROALBUR 6.5* 03/31/2014         Relevant Orders   Hemoglobin A1c   LDL cholesterol, direct   Lipid panel   Microalbumin / creatinine urine ratio   Hypertension   Relevant Orders   Comprehensive metabolic panel   Hypothyroidism   Relevant Orders   T4 AND TSH      I am having Ms. Sharlett Iles maintain her hydrocodone-acetaminophen, oxyCODONE-acetaminophen, estradiol, rOPINIRole, cyanocobalamin, traMADol, Vitamin D3, furosemide, albuterol, pregabalin, hydrALAZINE, benzonatate, nystatin, fluticasone, nebivolol, Glucose Blood, levothyroxine, glimepiride, Blood Pressure Monitor/Arm, diazepam, eszopiclone, diphenoxylate-atropine, sitaGLIPtin, dexamethasone, and sulfamethoxazole-trimethoprim.  No orders of the  defined types were placed in this encounter.    There are no discontinued medications.  Follow-up: No Follow-up on file.   [name], MD

## 2015-05-31 NOTE — Assessment & Plan Note (Addendum)
Sugars are currently well controlled on current regimen.  She will return for fasting labs in 2 weeks.   Lab Results  Component Value Date   HGBA1C 6.8* 08/13/2014   Lab Results  Component Value Date   MICROALBUR 6.5* 03/31/2014

## 2015-05-31 NOTE — Assessment & Plan Note (Signed)
Resulting in foot deformity and recurrent neuropathic ulcerations, managed by Dr Jacqualyn Posey. .  Prescription for custom made therapeutic shoes has been completed and given to patient.

## 2015-06-01 ENCOUNTER — Ambulatory Visit: Payer: BLUE CROSS/BLUE SHIELD | Admitting: Internal Medicine

## 2015-06-01 DIAGNOSIS — M545 Low back pain: Secondary | ICD-10-CM | POA: Diagnosis not present

## 2015-06-01 DIAGNOSIS — M5030 Other cervical disc degeneration, unspecified cervical region: Secondary | ICD-10-CM | POA: Diagnosis not present

## 2015-06-01 DIAGNOSIS — M546 Pain in thoracic spine: Secondary | ICD-10-CM | POA: Diagnosis not present

## 2015-06-01 DIAGNOSIS — M9901 Segmental and somatic dysfunction of cervical region: Secondary | ICD-10-CM | POA: Diagnosis not present

## 2015-06-01 DIAGNOSIS — M542 Cervicalgia: Secondary | ICD-10-CM | POA: Diagnosis not present

## 2015-06-01 DIAGNOSIS — M5134 Other intervertebral disc degeneration, thoracic region: Secondary | ICD-10-CM | POA: Diagnosis not present

## 2015-06-01 DIAGNOSIS — M5136 Other intervertebral disc degeneration, lumbar region: Secondary | ICD-10-CM | POA: Diagnosis not present

## 2015-06-01 DIAGNOSIS — M9902 Segmental and somatic dysfunction of thoracic region: Secondary | ICD-10-CM | POA: Diagnosis not present

## 2015-06-01 DIAGNOSIS — M9903 Segmental and somatic dysfunction of lumbar region: Secondary | ICD-10-CM | POA: Diagnosis not present

## 2015-06-03 DIAGNOSIS — M9902 Segmental and somatic dysfunction of thoracic region: Secondary | ICD-10-CM | POA: Diagnosis not present

## 2015-06-03 DIAGNOSIS — M545 Low back pain: Secondary | ICD-10-CM | POA: Diagnosis not present

## 2015-06-03 DIAGNOSIS — M9903 Segmental and somatic dysfunction of lumbar region: Secondary | ICD-10-CM | POA: Diagnosis not present

## 2015-06-03 DIAGNOSIS — M5030 Other cervical disc degeneration, unspecified cervical region: Secondary | ICD-10-CM | POA: Diagnosis not present

## 2015-06-03 DIAGNOSIS — M5136 Other intervertebral disc degeneration, lumbar region: Secondary | ICD-10-CM | POA: Diagnosis not present

## 2015-06-03 DIAGNOSIS — M9901 Segmental and somatic dysfunction of cervical region: Secondary | ICD-10-CM | POA: Diagnosis not present

## 2015-06-03 DIAGNOSIS — M546 Pain in thoracic spine: Secondary | ICD-10-CM | POA: Diagnosis not present

## 2015-06-03 DIAGNOSIS — M542 Cervicalgia: Secondary | ICD-10-CM | POA: Diagnosis not present

## 2015-06-03 DIAGNOSIS — M5134 Other intervertebral disc degeneration, thoracic region: Secondary | ICD-10-CM | POA: Diagnosis not present

## 2015-06-06 DIAGNOSIS — M542 Cervicalgia: Secondary | ICD-10-CM | POA: Diagnosis not present

## 2015-06-06 DIAGNOSIS — M9902 Segmental and somatic dysfunction of thoracic region: Secondary | ICD-10-CM | POA: Diagnosis not present

## 2015-06-06 DIAGNOSIS — M545 Low back pain: Secondary | ICD-10-CM | POA: Diagnosis not present

## 2015-06-06 DIAGNOSIS — M546 Pain in thoracic spine: Secondary | ICD-10-CM | POA: Diagnosis not present

## 2015-06-06 DIAGNOSIS — M5136 Other intervertebral disc degeneration, lumbar region: Secondary | ICD-10-CM | POA: Diagnosis not present

## 2015-06-06 DIAGNOSIS — M5134 Other intervertebral disc degeneration, thoracic region: Secondary | ICD-10-CM | POA: Diagnosis not present

## 2015-06-06 DIAGNOSIS — M5030 Other cervical disc degeneration, unspecified cervical region: Secondary | ICD-10-CM | POA: Diagnosis not present

## 2015-06-06 DIAGNOSIS — M9901 Segmental and somatic dysfunction of cervical region: Secondary | ICD-10-CM | POA: Diagnosis not present

## 2015-06-06 DIAGNOSIS — M9903 Segmental and somatic dysfunction of lumbar region: Secondary | ICD-10-CM | POA: Diagnosis not present

## 2015-06-07 ENCOUNTER — Other Ambulatory Visit: Payer: Self-pay | Admitting: Internal Medicine

## 2015-06-08 MED ORDER — BLOOD PRESSURE MONITOR/ARM DEVI: Status: DC

## 2015-06-08 NOTE — Telephone Encounter (Signed)
Refill sent.

## 2015-06-08 NOTE — Addendum Note (Signed)
Addended by: Nanci Pina on: 06/08/2015 02:59 PM   Modules accepted: Orders

## 2015-06-13 DIAGNOSIS — M5134 Other intervertebral disc degeneration, thoracic region: Secondary | ICD-10-CM | POA: Diagnosis not present

## 2015-06-13 DIAGNOSIS — M542 Cervicalgia: Secondary | ICD-10-CM | POA: Diagnosis not present

## 2015-06-13 DIAGNOSIS — M9901 Segmental and somatic dysfunction of cervical region: Secondary | ICD-10-CM | POA: Diagnosis not present

## 2015-06-13 DIAGNOSIS — M5136 Other intervertebral disc degeneration, lumbar region: Secondary | ICD-10-CM | POA: Diagnosis not present

## 2015-06-13 DIAGNOSIS — M546 Pain in thoracic spine: Secondary | ICD-10-CM | POA: Diagnosis not present

## 2015-06-13 DIAGNOSIS — M545 Low back pain: Secondary | ICD-10-CM | POA: Diagnosis not present

## 2015-06-13 DIAGNOSIS — M5030 Other cervical disc degeneration, unspecified cervical region: Secondary | ICD-10-CM | POA: Diagnosis not present

## 2015-06-13 DIAGNOSIS — M9903 Segmental and somatic dysfunction of lumbar region: Secondary | ICD-10-CM | POA: Diagnosis not present

## 2015-06-13 DIAGNOSIS — M9902 Segmental and somatic dysfunction of thoracic region: Secondary | ICD-10-CM | POA: Diagnosis not present

## 2015-06-14 ENCOUNTER — Telehealth: Payer: Self-pay | Admitting: Internal Medicine

## 2015-06-14 ENCOUNTER — Ambulatory Visit (INDEPENDENT_AMBULATORY_CARE_PROVIDER_SITE_OTHER): Payer: Medicare Other | Admitting: Podiatry

## 2015-06-14 ENCOUNTER — Encounter: Payer: Self-pay | Admitting: Podiatry

## 2015-06-14 VITALS — BP 146/81 | HR 62 | Resp 18

## 2015-06-14 DIAGNOSIS — E1149 Type 2 diabetes mellitus with other diabetic neurological complication: Secondary | ICD-10-CM | POA: Diagnosis not present

## 2015-06-14 DIAGNOSIS — L03032 Cellulitis of left toe: Secondary | ICD-10-CM

## 2015-06-14 NOTE — Telephone Encounter (Signed)
Called and spoke to pt. Pt is requesting a sooner appt with CY. Pt has a current appt with CY on 12/15. Appt made with CY on 12/1 per pt's request. Pt verbalized understanding and denied any further questions or concerns at this time.

## 2015-06-15 NOTE — Progress Notes (Signed)
Patient ID: Denise Sharp, female   DOB: May 28, 1946, 69 y.o.   MRN: [id number]  Subjective: 69 year old female presents to the office today for follow-up evaluation of localized infection and blister to the top of her left big toe. She states that she has continued with Neosporin over the area daily. She doesn't area open at times help dry. She was admitted to the antibiotics or GI issues. Denies any systemic complaints at this fevers, chills, nausea, vomiting. Pain, chest pain, shortness of breath. No other complaints at this time and no acute changes. Denies any other open sores or pre-ulcerative sores.  Objective: AAO 3, NAD DP/PT pulses palpable 2/4, CRT less than 2 seconds Protective sensation decreased with Simms once a monofilament The dorsal aspect the left hallux only IPJ is a faint amount of erythema but it is significantly improved compared to what it was previously. The blister has no fluid in the area and the area started callus over. There is no drainage or purulence. There is no is anything let us. There is no edema to the digit. There is no increase in warmth of the toe. No other open lesions or ulcerations. There is hallux knowledge is present on the left. No pain with calf compression, swelling, warmth, erythema.  Assessment: 69 year old female left dorsal hallux resolving infection, bulla  Plan: -Treatment options discussed including all alternatives, risks, and complications -She would like to hold off on any antibiotics due to GI issues. Continue the antibiotic ointment along the area. Monitor closely for any signs or symptoms of infection which worsened surgically off immediately should any occur or go directly to the emergency room. -Awaiting diabetic shoes. -Follow-up in 2 months for routine care or sooner if any problems arise. In the meantime, encouraged to call the office with any questions, concerns, change in symptoms.   Celesta Gentile, DPM

## 2015-06-16 ENCOUNTER — Other Ambulatory Visit (INDEPENDENT_AMBULATORY_CARE_PROVIDER_SITE_OTHER): Payer: Medicare Other

## 2015-06-16 ENCOUNTER — Encounter: Payer: Self-pay | Admitting: Internal Medicine

## 2015-06-16 ENCOUNTER — Ambulatory Visit (INDEPENDENT_AMBULATORY_CARE_PROVIDER_SITE_OTHER): Payer: Medicare Other | Admitting: Internal Medicine

## 2015-06-16 VITALS — BP 122/76 | HR 70 | Ht 64.5 in | Wt 234.0 lb

## 2015-06-16 DIAGNOSIS — E034 Atrophy of thyroid (acquired): Secondary | ICD-10-CM | POA: Diagnosis not present

## 2015-06-16 DIAGNOSIS — E669 Obesity, unspecified: Secondary | ICD-10-CM | POA: Diagnosis not present

## 2015-06-16 DIAGNOSIS — I10 Essential (primary) hypertension: Secondary | ICD-10-CM | POA: Diagnosis not present

## 2015-06-16 DIAGNOSIS — G4733 Obstructive sleep apnea (adult) (pediatric): Secondary | ICD-10-CM | POA: Diagnosis not present

## 2015-06-16 DIAGNOSIS — Z9989 Dependence on other enabling machines and devices: Secondary | ICD-10-CM

## 2015-06-16 DIAGNOSIS — E1142 Type 2 diabetes mellitus with diabetic polyneuropathy: Secondary | ICD-10-CM | POA: Diagnosis not present

## 2015-06-16 DIAGNOSIS — G4710 Hypersomnia, unspecified: Secondary | ICD-10-CM | POA: Diagnosis not present

## 2015-06-16 DIAGNOSIS — E038 Other specified hypothyroidism: Secondary | ICD-10-CM | POA: Diagnosis not present

## 2015-06-16 LAB — LDL CHOLESTEROL, DIRECT: Direct LDL: 111 mg/dL

## 2015-06-16 LAB — COMPREHENSIVE METABOLIC PANEL
ALT: 12 U/L (ref 0–35)
AST: 14 U/L (ref 0–37)
Albumin: 3.6 g/dL (ref 3.5–5.2)
Alkaline Phosphatase: 132 U/L — ABNORMAL HIGH (ref 39–117)
BUN: 14 mg/dL (ref 6–23)
CO2: 26 mEq/L (ref 19–32)
Calcium: 9.1 mg/dL (ref 8.4–10.5)
Chloride: 104 mEq/L (ref 96–112)
Creatinine, Ser: 1 mg/dL (ref 0.40–1.20)
GFR: 58.31 mL/min — ABNORMAL LOW (ref >60.00)
Glucose, Bld: 151 mg/dL — ABNORMAL HIGH (ref 70–99)
Potassium: 4.4 mEq/L (ref 3.5–5.1)
Sodium: 140 mEq/L (ref 135–145)
Total Bilirubin: 0.5 mg/dL (ref 0.2–1.2)
Total Protein: 7.2 g/dL (ref 6.0–8.3)

## 2015-06-16 LAB — LIPID PANEL
Cholesterol: 175 mg/dL (ref 0–200)
HDL: 44.3 mg/dL (ref >39.00)
LDL Cholesterol: 103 mg/dL — ABNORMAL HIGH (ref 0–99)
NonHDL: 131.02
Total CHOL/HDL Ratio: 4
Triglycerides: 141 mg/dL (ref 0.0–149.0)
VLDL: 28.2 mg/dL (ref 0.0–40.0)

## 2015-06-16 LAB — MICROALBUMIN / CREATININE URINE RATIO
Creatinine,U: 104.8 mg/dL
Microalb Creat Ratio: 1 mg/g (ref 0.0–30.0)
Microalb, Ur: 1.1 mg/dL (ref 0.0–1.9)

## 2015-06-16 LAB — HEMOGLOBIN A1C: Hgb A1c MFr Bld: 7.1 % — ABNORMAL HIGH (ref 4.6–6.5)

## 2015-06-16 NOTE — Progress Notes (Signed)
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 Will need new DME-not [employer] at patient request.  Followed by Dr Melvyn Novas for Pulmonary- DOE, LOV 12/06/14- Dr Gustavus Bryant note reviewed: Referred back by Dr [name]. Pt c/o "spells of passing out or falling asleep easily" since Jan 2016. She states she stays tired all of the time and only sleeps about 4 hours every night. Falls asleep while sitting and wathcing tv, riding in a car.    Most recent cpap 08/2012 titration > Set at 7 because had lost wt but gained back 50 lb and Does not know why that happened, newly on thyroid x sev months and having up to 25 bm's a day but still gaining wt - records show wt 68 in October then started problems late Dec2015 / Early Jan 2016 feeling a lot more sleepy also with periods of decreased responsiveness and had one spell where didn't come around and admitted to [facility] with cardiac w/u neg in Jan and Neurology w/u since then also inconclusive  Does not sleep well at hs with multiple drugs benzo's / has not tried belsomra      NPSG 08/14/12 at [facility]/ Sleep Med   Mild OSA, AHI 13.2/ hr, weight 218 lbs   CPAP 6 Meds include lunesta 2 mg, Lorcet-HD, Requip Husband here elaborate on wife's observations. She worked for many years rotating shift and says when she was younger she had no difficulty staying awake during the daytime. Diagnosed with obstructive sleep apnea many years ago and wearing CPAP for many years. Recent sleep study confirmed diagnosis for continuation of CPAP. She is wanting to switch from current DME. ENT surgery-tonsillectomy as a child. Current problem-over the last year she has found it much harder to fall asleep and stay asleep in his had much more problem with sleepiness during the daytime. Bedtime around midnight but often awake for 2 hours. Wakes 2 or 3 times before up some where between 9:30 and 10:30 AM. Has been taking Lunesta plus Valium most nights,  plus Lyrica 150 mg at bedtime and 75 mg daytime, and tramadol. Describes "passing out" episodes for which she has been hospitalized and has had a neurologic workup by Dr. Jannifer Franklin. Husband has witnessed these and says it looks as if she falls asleep. She drifts off very easily whenever sitting quietly but is easy to arouse. Denies cataplexy or sleep paralysis. Vivid dreams are not noted during naps. Thyroid disorders treated. Little caffeine. She has several opioid pain medicines for chronic back and joint pains, takes Benadryl occasionally if needed for seasonal rhinitis symptoms. 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 Melvyn Novas for pulmonary FOLLOWS FOR: review CPAP titration and MSLT results with patient. Pt has concerns with how MSLT test was done. NPSG 05/22/15- CPAP Titration study- Optimal 7 cwp,  MSLT 05/23/15 ( she had taken Lyrica the night before during CPAP titration against protocol). 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 She describes chronic sleep fragmentation by very frequent bathroom trips because of bowel urgency "25 times a day". She continues CPAP, already at 7 Most nights she uses Lunesta 1 mg plus Valium 5 mg plus Lyrica 75 mg plus Robinul for leg cramps. She says she sleeps much better away from home  and blames a bad mattress in her own bedroom. She and her husband are headed from here to a mattress store.   ROS-see HPI   Negative unless "+" Constitutional:    weight loss, night sweats, fevers, chills,+ fatigue, lassitude. HEENT:   + headaches, +ifficulty swallowing, tooth/dental problems, sore throat,       sneezing, itching, ear ache, +nasal congestion, post nasal drip, snoring CV:    +chest pain, orthopnea, PND, swelling in lower extremities,  anasarca,                                                    dizziness, palpitations Resp:  +shortness of breath with exertion or at rest.                productive cough,   non-productive cough, coughing up of blood.              change in color of mucus.  wheezing.   Skin:    rash or lesions. GI:  No-   heartburn, indigestion, abdominal pain, nausea, vomiting, diarrhea,                 change in bowel habits, loss of appetite GU: dysuria, change in color of urine, no urgency or frequency.   flank pain.  MS:   +joint pain, stiffness, decreased range of motion, back pain. Neuro-     nothing unusual Psych:  change in mood or affect.  depression or anxiety.   memory loss.  OBJ- Physical Exam General- Alert, Oriented, Affect-appropriate, Distress- none acute, +obese,+ talkative and brisk. Skin- rash-none, lesions- none, excoriation- none Lymphadenopathy- none Head- atraumatic            Eyes- Gross vision intact, PERRLA, conjunctivae and secretions clear            Ears- Hearing, canals-normal            Nose- Clear, no-Septal dev, mucus, polyps, erosion, perforation             Throat- Mallampati III , mucosa clear , drainage- none, tonsils- atrophic, + dentures Neck- flexible , trachea midline, no stridor , thyroid nl, carotid no bruit Chest - symmetrical excursion , unlabored           Heart/CV- RRR , no murmur , no gallop  , no rub, nl s1 s2                           - JVD- none , edema- none, stasis changes- none, varices- none           Lung- clear to P&A, wheeze- none, cough- none , dullness-none, rub- none           Chest wall-  Abd-  Br/ Gen/ Rectal- Not done, not indicated Extrem- cyanosis- none, clubbing, none, atrophy- none, strength- nl Neuro- grossly intact to observation

## 2015-06-16 NOTE — Patient Instructions (Signed)
Sample Nuvigil 150 mg, 1 each morning as needed  i agree with trying to make your bed as comfortable as possible - new mattress etc  You could try Tonic Water   Like an OJ glass of tonic water flavored with orange juice or tomato juice at supper time. See if this helps the night time leg cramps.

## 2015-06-17 DIAGNOSIS — M542 Cervicalgia: Secondary | ICD-10-CM | POA: Diagnosis not present

## 2015-06-17 DIAGNOSIS — M9901 Segmental and somatic dysfunction of cervical region: Secondary | ICD-10-CM | POA: Diagnosis not present

## 2015-06-17 DIAGNOSIS — M546 Pain in thoracic spine: Secondary | ICD-10-CM | POA: Diagnosis not present

## 2015-06-17 DIAGNOSIS — M5030 Other cervical disc degeneration, unspecified cervical region: Secondary | ICD-10-CM | POA: Diagnosis not present

## 2015-06-17 DIAGNOSIS — M9903 Segmental and somatic dysfunction of lumbar region: Secondary | ICD-10-CM | POA: Diagnosis not present

## 2015-06-17 DIAGNOSIS — M5134 Other intervertebral disc degeneration, thoracic region: Secondary | ICD-10-CM | POA: Diagnosis not present

## 2015-06-17 DIAGNOSIS — M5136 Other intervertebral disc degeneration, lumbar region: Secondary | ICD-10-CM | POA: Diagnosis not present

## 2015-06-17 DIAGNOSIS — M9902 Segmental and somatic dysfunction of thoracic region: Secondary | ICD-10-CM | POA: Diagnosis not present

## 2015-06-17 DIAGNOSIS — M545 Low back pain: Secondary | ICD-10-CM | POA: Diagnosis not present

## 2015-06-17 LAB — T4 AND TSH
T4, Total: 6.6 ug/dL (ref 4.5–12.0)
TSH: 4.4 u[IU]/mL (ref 0.450–4.500)

## 2015-06-18 ENCOUNTER — Encounter: Payer: Self-pay | Admitting: Internal Medicine

## 2015-06-18 DIAGNOSIS — G4710 Hypersomnia, unspecified: Secondary | ICD-10-CM | POA: Insufficient documentation

## 2015-06-18 NOTE — Assessment & Plan Note (Signed)
Most nights she uses Lunesta 1 mg plus Valium 5 mg plus Lyrica 75 mg plus Robinul for leg cramps.  Sleep is also badly disrupted by frequent bathroom trips. Her daytime sleepiness is most consistent with these causes, rather than insufficiently treated sleep apnea, or narcolepsy/idiopathic insomnia. Plan-emphasis on good sleep hygiene. Continues CPAP. Try to reduce sedating drugs at night. Try samples Nuvigil 150 mg

## 2015-06-18 NOTE — Assessment & Plan Note (Signed)
CPAP control should be good at current setting of 7. We will go forward to arrange a new DME company as she requests.

## 2015-06-18 NOTE — Assessment & Plan Note (Signed)
She looks heavier than I would expect for the degree of malabsorption suggested by the GI problems she implies.

## 2015-06-20 DIAGNOSIS — M5136 Other intervertebral disc degeneration, lumbar region: Secondary | ICD-10-CM | POA: Diagnosis not present

## 2015-06-20 DIAGNOSIS — M9903 Segmental and somatic dysfunction of lumbar region: Secondary | ICD-10-CM | POA: Diagnosis not present

## 2015-06-20 DIAGNOSIS — M9902 Segmental and somatic dysfunction of thoracic region: Secondary | ICD-10-CM | POA: Diagnosis not present

## 2015-06-20 DIAGNOSIS — M5134 Other intervertebral disc degeneration, thoracic region: Secondary | ICD-10-CM | POA: Diagnosis not present

## 2015-06-20 DIAGNOSIS — M9901 Segmental and somatic dysfunction of cervical region: Secondary | ICD-10-CM | POA: Diagnosis not present

## 2015-06-20 DIAGNOSIS — M546 Pain in thoracic spine: Secondary | ICD-10-CM | POA: Diagnosis not present

## 2015-06-20 DIAGNOSIS — M5030 Other cervical disc degeneration, unspecified cervical region: Secondary | ICD-10-CM | POA: Diagnosis not present

## 2015-06-20 DIAGNOSIS — M542 Cervicalgia: Secondary | ICD-10-CM | POA: Diagnosis not present

## 2015-06-20 DIAGNOSIS — M545 Low back pain: Secondary | ICD-10-CM | POA: Diagnosis not present

## 2015-06-24 DIAGNOSIS — M5134 Other intervertebral disc degeneration, thoracic region: Secondary | ICD-10-CM | POA: Diagnosis not present

## 2015-06-24 DIAGNOSIS — M545 Low back pain: Secondary | ICD-10-CM | POA: Diagnosis not present

## 2015-06-24 DIAGNOSIS — M546 Pain in thoracic spine: Secondary | ICD-10-CM | POA: Diagnosis not present

## 2015-06-24 DIAGNOSIS — M5136 Other intervertebral disc degeneration, lumbar region: Secondary | ICD-10-CM | POA: Diagnosis not present

## 2015-06-24 DIAGNOSIS — M9902 Segmental and somatic dysfunction of thoracic region: Secondary | ICD-10-CM | POA: Diagnosis not present

## 2015-06-24 DIAGNOSIS — M542 Cervicalgia: Secondary | ICD-10-CM | POA: Diagnosis not present

## 2015-06-24 DIAGNOSIS — M9901 Segmental and somatic dysfunction of cervical region: Secondary | ICD-10-CM | POA: Diagnosis not present

## 2015-06-24 DIAGNOSIS — M9903 Segmental and somatic dysfunction of lumbar region: Secondary | ICD-10-CM | POA: Diagnosis not present

## 2015-06-24 DIAGNOSIS — M5030 Other cervical disc degeneration, unspecified cervical region: Secondary | ICD-10-CM | POA: Diagnosis not present

## 2015-06-24 NOTE — Addendum Note (Signed)
Addended by: Clayborne Dana C on: 06/24/2015 10:47 AM   Modules accepted: Orders

## 2015-06-28 DIAGNOSIS — M5136 Other intervertebral disc degeneration, lumbar region: Secondary | ICD-10-CM | POA: Diagnosis not present

## 2015-06-28 DIAGNOSIS — M9901 Segmental and somatic dysfunction of cervical region: Secondary | ICD-10-CM | POA: Diagnosis not present

## 2015-06-28 DIAGNOSIS — M545 Low back pain: Secondary | ICD-10-CM | POA: Diagnosis not present

## 2015-06-28 DIAGNOSIS — M9903 Segmental and somatic dysfunction of lumbar region: Secondary | ICD-10-CM | POA: Diagnosis not present

## 2015-06-28 DIAGNOSIS — M5134 Other intervertebral disc degeneration, thoracic region: Secondary | ICD-10-CM | POA: Diagnosis not present

## 2015-06-28 DIAGNOSIS — M5030 Other cervical disc degeneration, unspecified cervical region: Secondary | ICD-10-CM | POA: Diagnosis not present

## 2015-06-28 DIAGNOSIS — M9902 Segmental and somatic dysfunction of thoracic region: Secondary | ICD-10-CM | POA: Diagnosis not present

## 2015-06-28 DIAGNOSIS — M546 Pain in thoracic spine: Secondary | ICD-10-CM | POA: Diagnosis not present

## 2015-06-28 DIAGNOSIS — M542 Cervicalgia: Secondary | ICD-10-CM | POA: Diagnosis not present

## 2015-06-30 ENCOUNTER — Ambulatory Visit: Payer: BLUE CROSS/BLUE SHIELD | Admitting: Internal Medicine

## 2015-07-01 DIAGNOSIS — M9902 Segmental and somatic dysfunction of thoracic region: Secondary | ICD-10-CM | POA: Diagnosis not present

## 2015-07-01 DIAGNOSIS — M9901 Segmental and somatic dysfunction of cervical region: Secondary | ICD-10-CM | POA: Diagnosis not present

## 2015-07-01 DIAGNOSIS — M5136 Other intervertebral disc degeneration, lumbar region: Secondary | ICD-10-CM | POA: Diagnosis not present

## 2015-07-01 DIAGNOSIS — M545 Low back pain: Secondary | ICD-10-CM | POA: Diagnosis not present

## 2015-07-01 DIAGNOSIS — M546 Pain in thoracic spine: Secondary | ICD-10-CM | POA: Diagnosis not present

## 2015-07-01 DIAGNOSIS — M9903 Segmental and somatic dysfunction of lumbar region: Secondary | ICD-10-CM | POA: Diagnosis not present

## 2015-07-01 DIAGNOSIS — M5030 Other cervical disc degeneration, unspecified cervical region: Secondary | ICD-10-CM | POA: Diagnosis not present

## 2015-07-01 DIAGNOSIS — M542 Cervicalgia: Secondary | ICD-10-CM | POA: Diagnosis not present

## 2015-07-01 DIAGNOSIS — M5134 Other intervertebral disc degeneration, thoracic region: Secondary | ICD-10-CM | POA: Diagnosis not present

## 2015-07-04 DIAGNOSIS — M542 Cervicalgia: Secondary | ICD-10-CM | POA: Diagnosis not present

## 2015-07-04 DIAGNOSIS — M5136 Other intervertebral disc degeneration, lumbar region: Secondary | ICD-10-CM | POA: Diagnosis not present

## 2015-07-04 DIAGNOSIS — M9901 Segmental and somatic dysfunction of cervical region: Secondary | ICD-10-CM | POA: Diagnosis not present

## 2015-07-04 DIAGNOSIS — M5134 Other intervertebral disc degeneration, thoracic region: Secondary | ICD-10-CM | POA: Diagnosis not present

## 2015-07-04 DIAGNOSIS — M546 Pain in thoracic spine: Secondary | ICD-10-CM | POA: Diagnosis not present

## 2015-07-04 DIAGNOSIS — M5030 Other cervical disc degeneration, unspecified cervical region: Secondary | ICD-10-CM | POA: Diagnosis not present

## 2015-07-04 DIAGNOSIS — M545 Low back pain: Secondary | ICD-10-CM | POA: Diagnosis not present

## 2015-07-04 DIAGNOSIS — M9902 Segmental and somatic dysfunction of thoracic region: Secondary | ICD-10-CM | POA: Diagnosis not present

## 2015-07-04 DIAGNOSIS — M9903 Segmental and somatic dysfunction of lumbar region: Secondary | ICD-10-CM | POA: Diagnosis not present

## 2015-07-06 ENCOUNTER — Telehealth: Payer: Self-pay | Admitting: *Deleted

## 2015-07-06 NOTE — Telephone Encounter (Signed)
-----   Message from Darlina Guys sent at 07/06/2015  8:23 AM EST ----- Tiajuana Amass.  You may already know this but just wanted to make you aware if not. We can take this patient for CPAP supplies but she is not yet eligiible for a new unit. [employer] paid for a CPAP 09/29/2012 so she would not be eligible for new equipment until after this date in 2019.   Thanks! Melissa

## 2015-07-06 NOTE — Telephone Encounter (Signed)
Called pt, NA WCB

## 2015-07-07 NOTE — Telephone Encounter (Signed)
Called spoke with pt. She reports she was not expecting to get a new unit so this is fine. Will sign off message

## 2015-07-13 ENCOUNTER — Encounter: Payer: Self-pay | Admitting: *Deleted

## 2015-07-13 DIAGNOSIS — M9902 Segmental and somatic dysfunction of thoracic region: Secondary | ICD-10-CM | POA: Diagnosis not present

## 2015-07-13 DIAGNOSIS — M546 Pain in thoracic spine: Secondary | ICD-10-CM | POA: Diagnosis not present

## 2015-07-13 DIAGNOSIS — M5136 Other intervertebral disc degeneration, lumbar region: Secondary | ICD-10-CM | POA: Diagnosis not present

## 2015-07-13 DIAGNOSIS — M5134 Other intervertebral disc degeneration, thoracic region: Secondary | ICD-10-CM | POA: Diagnosis not present

## 2015-07-13 DIAGNOSIS — M542 Cervicalgia: Secondary | ICD-10-CM | POA: Diagnosis not present

## 2015-07-13 DIAGNOSIS — M9901 Segmental and somatic dysfunction of cervical region: Secondary | ICD-10-CM | POA: Diagnosis not present

## 2015-07-13 DIAGNOSIS — M5030 Other cervical disc degeneration, unspecified cervical region: Secondary | ICD-10-CM | POA: Diagnosis not present

## 2015-07-13 DIAGNOSIS — M9903 Segmental and somatic dysfunction of lumbar region: Secondary | ICD-10-CM | POA: Diagnosis not present

## 2015-07-13 DIAGNOSIS — M545 Low back pain: Secondary | ICD-10-CM | POA: Diagnosis not present

## 2015-07-15 DIAGNOSIS — M545 Low back pain: Secondary | ICD-10-CM | POA: Diagnosis not present

## 2015-07-15 DIAGNOSIS — M5136 Other intervertebral disc degeneration, lumbar region: Secondary | ICD-10-CM | POA: Diagnosis not present

## 2015-07-15 DIAGNOSIS — M9902 Segmental and somatic dysfunction of thoracic region: Secondary | ICD-10-CM | POA: Diagnosis not present

## 2015-07-15 DIAGNOSIS — M9903 Segmental and somatic dysfunction of lumbar region: Secondary | ICD-10-CM | POA: Diagnosis not present

## 2015-07-15 DIAGNOSIS — M5134 Other intervertebral disc degeneration, thoracic region: Secondary | ICD-10-CM | POA: Diagnosis not present

## 2015-07-15 DIAGNOSIS — M546 Pain in thoracic spine: Secondary | ICD-10-CM | POA: Diagnosis not present

## 2015-07-15 DIAGNOSIS — M9901 Segmental and somatic dysfunction of cervical region: Secondary | ICD-10-CM | POA: Diagnosis not present

## 2015-07-15 DIAGNOSIS — M5030 Other cervical disc degeneration, unspecified cervical region: Secondary | ICD-10-CM | POA: Diagnosis not present

## 2015-07-15 DIAGNOSIS — M542 Cervicalgia: Secondary | ICD-10-CM | POA: Diagnosis not present

## 2015-08-01 DIAGNOSIS — M5134 Other intervertebral disc degeneration, thoracic region: Secondary | ICD-10-CM | POA: Diagnosis not present

## 2015-08-01 DIAGNOSIS — M9901 Segmental and somatic dysfunction of cervical region: Secondary | ICD-10-CM | POA: Diagnosis not present

## 2015-08-01 DIAGNOSIS — M9903 Segmental and somatic dysfunction of lumbar region: Secondary | ICD-10-CM | POA: Diagnosis not present

## 2015-08-01 DIAGNOSIS — M542 Cervicalgia: Secondary | ICD-10-CM | POA: Diagnosis not present

## 2015-08-01 DIAGNOSIS — M5030 Other cervical disc degeneration, unspecified cervical region: Secondary | ICD-10-CM | POA: Diagnosis not present

## 2015-08-01 DIAGNOSIS — M545 Low back pain: Secondary | ICD-10-CM | POA: Diagnosis not present

## 2015-08-01 DIAGNOSIS — M5136 Other intervertebral disc degeneration, lumbar region: Secondary | ICD-10-CM | POA: Diagnosis not present

## 2015-08-01 DIAGNOSIS — M546 Pain in thoracic spine: Secondary | ICD-10-CM | POA: Diagnosis not present

## 2015-08-01 DIAGNOSIS — M9902 Segmental and somatic dysfunction of thoracic region: Secondary | ICD-10-CM | POA: Diagnosis not present

## 2015-08-05 ENCOUNTER — Telehealth: Payer: Self-pay | Admitting: Internal Medicine

## 2015-08-05 DIAGNOSIS — K439 Ventral hernia without obstruction or gangrene: Secondary | ICD-10-CM | POA: Diagnosis not present

## 2015-08-05 DIAGNOSIS — Z01818 Encounter for other preprocedural examination: Secondary | ICD-10-CM | POA: Diagnosis not present

## 2015-08-05 NOTE — Telephone Encounter (Signed)
Patient needs cardiology to clear her for a upcoming surgery.  Was seeing [facility]?? And now they are at [facility] and her insurance will not let her go to them.  Needs a referral to cardiology for clearance.  Please advise?

## 2015-08-05 NOTE — Telephone Encounter (Signed)
Pt called stating she needs a referral to the cardiologist and be cleared for surgery. Surgery is scheduled for 08/11/2015. ASAP. Pt @ [phone]. Thank You!

## 2015-08-05 NOTE — Telephone Encounter (Signed)
Patient stated that ,Dr Darnell Level will take care of the referral. She will no longer need this office to work on this referral . She stated that she will send over any test result to be viewed by Dr.Tullo .

## 2015-08-05 NOTE — Telephone Encounter (Signed)
Used to see cardiologist that has now moved to [facility] and her insurance wont cover her going there. She needs a new referral for new cardiologist for surgical clearance, surgey scheduled 08/11/15

## 2015-08-08 ENCOUNTER — Other Ambulatory Visit: Payer: Self-pay

## 2015-08-10 DIAGNOSIS — G4730 Sleep apnea, unspecified: Secondary | ICD-10-CM | POA: Diagnosis not present

## 2015-08-10 DIAGNOSIS — Z6838 Body mass index (BMI) 38.0-38.9, adult: Secondary | ICD-10-CM | POA: Diagnosis not present

## 2015-08-10 DIAGNOSIS — Z79899 Other long term (current) drug therapy: Secondary | ICD-10-CM | POA: Diagnosis not present

## 2015-08-10 DIAGNOSIS — R609 Edema, unspecified: Secondary | ICD-10-CM | POA: Diagnosis not present

## 2015-08-10 DIAGNOSIS — Z90710 Acquired absence of both cervix and uterus: Secondary | ICD-10-CM | POA: Diagnosis not present

## 2015-08-10 DIAGNOSIS — I10 Essential (primary) hypertension: Secondary | ICD-10-CM | POA: Diagnosis not present

## 2015-08-10 DIAGNOSIS — K660 Peritoneal adhesions (postprocedural) (postinfection): Secondary | ICD-10-CM | POA: Diagnosis not present

## 2015-08-10 DIAGNOSIS — R0602 Shortness of breath: Secondary | ICD-10-CM | POA: Diagnosis not present

## 2015-08-10 DIAGNOSIS — E119 Type 2 diabetes mellitus without complications: Secondary | ICD-10-CM | POA: Diagnosis not present

## 2015-08-10 DIAGNOSIS — Z01818 Encounter for other preprocedural examination: Secondary | ICD-10-CM | POA: Diagnosis not present

## 2015-08-10 DIAGNOSIS — R079 Chest pain, unspecified: Secondary | ICD-10-CM | POA: Diagnosis not present

## 2015-08-10 DIAGNOSIS — Z9889 Other specified postprocedural states: Secondary | ICD-10-CM | POA: Diagnosis not present

## 2015-08-10 DIAGNOSIS — E669 Obesity, unspecified: Secondary | ICD-10-CM | POA: Diagnosis not present

## 2015-08-10 DIAGNOSIS — E785 Hyperlipidemia, unspecified: Secondary | ICD-10-CM | POA: Diagnosis not present

## 2015-08-10 DIAGNOSIS — M069 Rheumatoid arthritis, unspecified: Secondary | ICD-10-CM | POA: Diagnosis not present

## 2015-08-10 DIAGNOSIS — K439 Ventral hernia without obstruction or gangrene: Secondary | ICD-10-CM | POA: Diagnosis not present

## 2015-08-11 DIAGNOSIS — E785 Hyperlipidemia, unspecified: Secondary | ICD-10-CM | POA: Diagnosis not present

## 2015-08-11 DIAGNOSIS — K432 Incisional hernia without obstruction or gangrene: Secondary | ICD-10-CM | POA: Diagnosis not present

## 2015-08-11 DIAGNOSIS — E669 Obesity, unspecified: Secondary | ICD-10-CM | POA: Diagnosis not present

## 2015-08-11 DIAGNOSIS — R079 Chest pain, unspecified: Secondary | ICD-10-CM | POA: Diagnosis not present

## 2015-08-11 DIAGNOSIS — Z5331 Laparoscopic surgical procedure converted to open procedure: Secondary | ICD-10-CM | POA: Diagnosis not present

## 2015-08-11 DIAGNOSIS — K439 Ventral hernia without obstruction or gangrene: Secondary | ICD-10-CM | POA: Diagnosis not present

## 2015-08-11 DIAGNOSIS — I10 Essential (primary) hypertension: Secondary | ICD-10-CM | POA: Diagnosis not present

## 2015-08-11 DIAGNOSIS — E119 Type 2 diabetes mellitus without complications: Secondary | ICD-10-CM | POA: Diagnosis not present

## 2015-08-11 DIAGNOSIS — R109 Unspecified abdominal pain: Secondary | ICD-10-CM | POA: Diagnosis not present

## 2015-08-11 DIAGNOSIS — K660 Peritoneal adhesions (postprocedural) (postinfection): Secondary | ICD-10-CM | POA: Diagnosis not present

## 2015-08-11 HISTORY — PX: UMBILICAL HERNIA REPAIR: SHX196

## 2015-08-12 DIAGNOSIS — R079 Chest pain, unspecified: Secondary | ICD-10-CM | POA: Diagnosis not present

## 2015-08-12 DIAGNOSIS — K660 Peritoneal adhesions (postprocedural) (postinfection): Secondary | ICD-10-CM | POA: Diagnosis not present

## 2015-08-12 DIAGNOSIS — E119 Type 2 diabetes mellitus without complications: Secondary | ICD-10-CM | POA: Diagnosis not present

## 2015-08-12 DIAGNOSIS — E785 Hyperlipidemia, unspecified: Secondary | ICD-10-CM | POA: Diagnosis not present

## 2015-08-12 DIAGNOSIS — K439 Ventral hernia without obstruction or gangrene: Secondary | ICD-10-CM | POA: Diagnosis not present

## 2015-08-12 DIAGNOSIS — I10 Essential (primary) hypertension: Secondary | ICD-10-CM | POA: Diagnosis not present

## 2015-08-12 DIAGNOSIS — E669 Obesity, unspecified: Secondary | ICD-10-CM | POA: Diagnosis not present

## 2015-08-16 ENCOUNTER — Ambulatory Visit (INDEPENDENT_AMBULATORY_CARE_PROVIDER_SITE_OTHER): Payer: Medicare Other | Admitting: Podiatry

## 2015-08-16 ENCOUNTER — Encounter: Payer: Self-pay | Admitting: Podiatry

## 2015-08-16 DIAGNOSIS — E1149 Type 2 diabetes mellitus with other diabetic neurological complication: Secondary | ICD-10-CM | POA: Diagnosis not present

## 2015-08-16 DIAGNOSIS — B351 Tinea unguium: Secondary | ICD-10-CM | POA: Diagnosis not present

## 2015-08-16 DIAGNOSIS — M79676 Pain in unspecified toe(s): Secondary | ICD-10-CM

## 2015-08-17 NOTE — Progress Notes (Signed)
Patient ID: Denise Sharp, female   DOB: 1946-03-31, 70 y.o.   MRN: [id number]  Subjective: 70 y.o. returns the office today for painful, elongated, thickened toenails which she cannot trim herself. Denies any redness or drainage around the nails. She still occasionally gets some irritation over the dorsal hallux IPJ on the left foot however there is been no open lesions identified at this time. Denies any acute changes since last appointment and no new complaints today. Denies any systemic complaints such as fevers, chills, nausea, vomiting.   Objective: AAO 3, NAD DP/PT pulses palpable, CRT less than 3 seconds Protective sensation decreased with Simms Weinstein monofilament Nails hypertrophic, dystrophic, elongated, brittle, discolored 10. There is tenderness overlying the nails 1-5 bilaterally. There is no surrounding erythema or drainage along the nail sites. No open lesions or pre-ulcerative lesions are identified. No other areas of tenderness bilateral lower extremities. No overlying edema, erythema, increased warmth. No pain with calf compression, swelling, warmth, erythema.  Assessment: Patient presents with symptomatic onychomycosis  Plan: -Treatment options including alternatives, risks, complications were discussed -Nails sharply debrided 10 without complication/bleeding. -Offloading pads -Discussed daily foot inspection. If there are any changes, to call the office immediately.  -Follow-up in 3 months or sooner if any problems are to arise. In the meantime, encouraged to call the office with any questions, concerns, changes symptoms.  Celesta Gentile, DPM

## 2015-08-24 DIAGNOSIS — M5030 Other cervical disc degeneration, unspecified cervical region: Secondary | ICD-10-CM | POA: Diagnosis not present

## 2015-08-24 DIAGNOSIS — M5136 Other intervertebral disc degeneration, lumbar region: Secondary | ICD-10-CM | POA: Diagnosis not present

## 2015-08-24 DIAGNOSIS — M542 Cervicalgia: Secondary | ICD-10-CM | POA: Diagnosis not present

## 2015-08-24 DIAGNOSIS — M5134 Other intervertebral disc degeneration, thoracic region: Secondary | ICD-10-CM | POA: Diagnosis not present

## 2015-08-24 DIAGNOSIS — M9901 Segmental and somatic dysfunction of cervical region: Secondary | ICD-10-CM | POA: Diagnosis not present

## 2015-08-24 DIAGNOSIS — M545 Low back pain: Secondary | ICD-10-CM | POA: Diagnosis not present

## 2015-08-24 DIAGNOSIS — M546 Pain in thoracic spine: Secondary | ICD-10-CM | POA: Diagnosis not present

## 2015-08-24 DIAGNOSIS — M9902 Segmental and somatic dysfunction of thoracic region: Secondary | ICD-10-CM | POA: Diagnosis not present

## 2015-08-24 DIAGNOSIS — M9903 Segmental and somatic dysfunction of lumbar region: Secondary | ICD-10-CM | POA: Diagnosis not present

## 2015-09-07 DIAGNOSIS — M5136 Other intervertebral disc degeneration, lumbar region: Secondary | ICD-10-CM | POA: Diagnosis not present

## 2015-09-07 DIAGNOSIS — M9902 Segmental and somatic dysfunction of thoracic region: Secondary | ICD-10-CM | POA: Diagnosis not present

## 2015-09-07 DIAGNOSIS — M5030 Other cervical disc degeneration, unspecified cervical region: Secondary | ICD-10-CM | POA: Diagnosis not present

## 2015-09-07 DIAGNOSIS — M546 Pain in thoracic spine: Secondary | ICD-10-CM | POA: Diagnosis not present

## 2015-09-07 DIAGNOSIS — M5134 Other intervertebral disc degeneration, thoracic region: Secondary | ICD-10-CM | POA: Diagnosis not present

## 2015-09-07 DIAGNOSIS — M542 Cervicalgia: Secondary | ICD-10-CM | POA: Diagnosis not present

## 2015-09-07 DIAGNOSIS — M545 Low back pain: Secondary | ICD-10-CM | POA: Diagnosis not present

## 2015-09-07 DIAGNOSIS — M9901 Segmental and somatic dysfunction of cervical region: Secondary | ICD-10-CM | POA: Diagnosis not present

## 2015-09-07 DIAGNOSIS — M9903 Segmental and somatic dysfunction of lumbar region: Secondary | ICD-10-CM | POA: Diagnosis not present

## 2015-09-14 ENCOUNTER — Other Ambulatory Visit: Payer: Self-pay | Admitting: Internal Medicine

## 2015-09-14 ENCOUNTER — Encounter: Payer: Self-pay | Admitting: *Deleted

## 2015-09-14 ENCOUNTER — Encounter: Payer: Self-pay | Admitting: Internal Medicine

## 2015-09-14 DIAGNOSIS — I10 Essential (primary) hypertension: Secondary | ICD-10-CM

## 2015-09-14 MED ORDER — TETANUS-DIPHTH-ACELL PERTUSSIS 5-2.5-18.5 LF-MCG/0.5 IM SUSP: 0.5000 mL | Freq: Once | INTRAMUSCULAR | Status: DC

## 2015-09-14 MED ORDER — ZOSTER VACCINE LIVE 19400 UNT/0.65ML ~~LOC~~ SOLR: 0.6500 mL | Freq: Once | SUBCUTANEOUS | Status: DC

## 2015-09-14 NOTE — Progress Notes (Signed)
Mailed to patient as requested.

## 2015-09-19 ENCOUNTER — Ambulatory Visit (INDEPENDENT_AMBULATORY_CARE_PROVIDER_SITE_OTHER): Payer: Medicare Other | Admitting: Neurology

## 2015-09-19 ENCOUNTER — Telehealth: Payer: Self-pay | Admitting: *Deleted

## 2015-09-19 ENCOUNTER — Encounter: Payer: Self-pay | Admitting: Neurology

## 2015-09-19 VITALS — BP 142/84 | HR 82 | Resp 20 | Ht 64.5 in | Wt 226.0 lb

## 2015-09-19 DIAGNOSIS — R269 Unspecified abnormalities of gait and mobility: Secondary | ICD-10-CM | POA: Diagnosis not present

## 2015-09-19 DIAGNOSIS — G2581 Restless legs syndrome: Secondary | ICD-10-CM | POA: Diagnosis not present

## 2015-09-19 DIAGNOSIS — R55 Syncope and collapse: Secondary | ICD-10-CM | POA: Diagnosis not present

## 2015-09-19 DIAGNOSIS — E1142 Type 2 diabetes mellitus with diabetic polyneuropathy: Secondary | ICD-10-CM

## 2015-09-19 DIAGNOSIS — R51 Headache: Secondary | ICD-10-CM

## 2015-09-19 DIAGNOSIS — G4486 Cervicogenic headache: Secondary | ICD-10-CM

## 2015-09-19 MED ORDER — PREGABALIN 100 MG PO CAPS: ORAL_CAPSULE | ORAL | Status: DC

## 2015-09-19 MED ORDER — ZOSTER VACCINE LIVE 19400 UNT/0.65ML ~~LOC~~ SOLR: 0.6500 mL | Freq: Once | SUBCUTANEOUS | Status: DC

## 2015-09-19 MED ORDER — TETANUS-DIPHTH-ACELL PERTUSSIS 5-2.5-18.5 LF-MCG/0.5 IM SUSP: 0.5000 mL | Freq: Once | INTRAMUSCULAR | Status: DC

## 2015-09-19 NOTE — Telephone Encounter (Signed)
Patient called back to advise pregabalin (LYRICA) 100 MG capsule Rx needs to be re-done. Rx has to be sent in electronically to [facility] and needs to be a 3 month supply with 3 refills. When Rx is sent in electronically it is then free for patient.

## 2015-09-19 NOTE — Progress Notes (Signed)
Reason for visit: Headache  Denise Sharp is an 70 y.o. female  History of present illness:  Denise Sharp is a 70 year old right-handed white female with a history of multiple medical issues. The patient has obesity, diabetes, cervical and lumbar chronic pain issues associated with spondylosis. She has a history of syncopal events, hypothyroidism, mild memory disturbance, peripheral neuropathy associated with diabetes, and an associated balance issue. She has irritable bowel syndrome and interstitial cystitis. The patient has an InterStim stimulator in place, a history of restless leg syndrome, and chronic insomnia. The patient has ongoing headaches that come up in the back of the neck. She is seeing a chiropractor without significant benefit. She initially gained benefit with Lyrica, she currently is on 75 mg in the morning and 150 mg in the evening. The patient has not had any recent falls, she indicates that she is numb from the knees down. The patient denies pain radiating down from the neck into the arms or from the back down the legs. She has not had any recent episodes of syncope. She believes that her memory issues are relatively stable.  Past Medical History  Diagnosis Date  . Diabetes mellitus   . Depression   . Hypertension   . Cervical spinal stenosis 1994    due to trauma to back ([employer] accident), has intermittent paralysis and parasthesias  . Adrenal mass ([place])   . IBS (irritable bowel syndrome)   . Obstructive sleep apnea     using CPAP  . Anemia     iron deficient post  2 unit txfsn 2009, normal endo/colonoscopy by [facility]  . Esophageal stenosis March 2011    with transietn outlet obstruction by food, cleared by EGD   . LBBB (left bundle branch block)   . Obesity   . Apnea   . Headache(784.0)   . Arthritis   . Restless leg syndrome   . Dizziness     chronic dizziness  . Adrenal mass, left ([place])   . 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 Abnormality of  gait 03/25/2013  . Syncope and collapse 03/12/2014  . Cervicogenic headache 03/23/2014    Past Surgical History  Procedure Laterality Date  . Gastric bypass    . Abdominal hysterectomy    . Gastric bypass  2000, 2005    Dr. Debroah Loop  . Appendectomy    . Gallbladder surgery  1976  . Rotator cuff repair      right  . Joint replacement  2007    bilateral knee. [name],  [name]  . Spine surgery  1995    [place]  . Interstim implant placement  April 2013    [place]  . Gallbladder surgery  resection  . Total abdominal hysterectomy w/ bilateral salpingoophorectomy  1974    Family History  Problem Relation Age of Onset  . Heart disease Father   . Hypertension Father   . Prostate cancer Father   . Stroke Father   . Osteoporosis Father   . Stroke Mother   . Depression Mother   . Headache Mother   . Heart disease Mother   . Thyroid disease Mother   . Hypertension Mother   . Diabetes Daughter   . Heart disease Daughter   . Hypertension Daughter   . Hypertension Son     Social history:  reports that she has never smoked. She has never used smokeless tobacco. She reports that she does not drink alcohol or use illicit drugs.    Allergies  Allergen Reactions  . Diovan [Valsartan]   . Erythromycin   . Flagyl [Metronidazole]   . Glucophage [Metformin Hcl]   . Xanax [Alprazolam]   . Amoxicillin Diarrhea  . Demeclocycline Hives  . Erythromycin Base   . Metformin   . Sulfa Antibiotics Rash    As child  . Tetracyclines & Related Hives and Rash    Medications:  Prior to Admission medications   Medication Sig Start Date End Date Taking? Authorizing Provider  benzonatate (TESSALON) 200 MG capsule Take 1 capsule (200 mg total) by mouth 3 (three) times daily as needed for cough. 08/03/14  Yes Rubbie Battiest, NP  Blood Pressure Monitoring (BLOOD PRESSURE MONITOR/ARM) DEVI Automatic BP machine.  Normal adult size arm 06/08/15  Yes [name], MD  Cholecalciferol (VITAMIN D3) 10000  UNITS capsule Take 10,000 Units by mouth once a week.   Yes Historical Provider, MD  cyanocobalamin (,VITAMIN B-12,) 1000 MCG/ML injection 1000 ml (1 ml) injection twice monthly 01/19/13  Yes [name], MD  diazepam (VALIUM) 10 MG tablet Take 1 tablet (10 mg total) by mouth at bedtime as needed for anxiety. 12/30/14  Yes [name], MD  diphenoxylate-atropine (LOMOTIL) 2.5-0.025 MG per tablet Take 1-2 tablets by mouth 4 (four) times daily as needed for diarrhea or loose stools. 03/09/15  Yes [name], MD  Eluxadoline 100 MG TABS Take by mouth.   Yes Historical Provider, MD  eszopiclone (LUNESTA) 2 MG TABS tablet Take 1 tablet (2 mg total) by mouth at bedtime. Take immediately before bedtime 12/30/14  Yes [name], MD  furosemide (LASIX) 20 MG tablet Take 1 tablet (20 mg total) by mouth daily. 12/09/13  Yes [name], MD  Glucose Blood (BAYER BREEZE 2 TEST) DISK Use one test strip each time glucose needs testing. 08/03/14  Yes Rubbie Battiest, NP  hydrALAZINE (APRESOLINE) 25 MG tablet Take 1 tablet (25 mg total) by mouth 3 (three) times daily. As needed for bp > 160 08/03/14  Yes Rubbie Battiest, NP  hydrocodone-acetaminophen (LORCET-HD) 5-500 MG per capsule Take 1 capsule by mouth every 6 (six) hours as needed. 07/29/12  Yes [name], MD  loperamide (IMODIUM A-D) 2 MG tablet Take by mouth.   Yes Historical Provider, MD  nebivolol (BYSTOLIC) 5 MG tablet Take 1 tablet (5 mg total) by mouth daily. 08/03/14  Yes Rubbie Battiest, NP  nystatin (MYCOSTATIN) powder Apply topically 2 (two) times daily. 08/03/14  Yes Rubbie Battiest, NP  oxyCODONE-acetaminophen (PERCOCET/ROXICET) 5-325 MG per tablet Take 1 tablet by mouth every 6 (six) hours as needed. 07/29/12  Yes [name], MD  pregabalin (LYRICA) 50 MG capsule Take 1 tablet in the morning and 2 tablets in the evening. 05/31/14  Yes Ward Givens, NP  rOPINIRole (REQUIP) 1 MG tablet Take 1 tablet (1 mg total) by mouth at bedtime. 10/01/12  Yes  [name], MD  sitaGLIPtin (JANUVIA) 100 MG tablet Take 1 tablet (100 mg total) by mouth daily. 05/03/15  Yes [name], MD  Tdap Durwin Reges) 5-2.5-18.5 LF-MCG/0.5 injection Inject 0.5 mLs into the muscle once. 09/14/15  Yes [name], MD  traMADol (ULTRAM) 50 MG tablet Take 50 mg by mouth 4 (four) times daily as needed. 10/01/12  Yes [name], MD  zoster vaccine live, PF, (ZOSTAVAX) 60454 UNT/0.65ML injection Inject 19,400 Units into the skin once. 09/14/15  Yes [name], MD  albuterol (PROVENTIL HFA;VENTOLIN HFA) 108 (  90 BASE) MCG/ACT inhaler Inhale 2 puffs into the lungs every 6 (six) hours as needed for wheezing or shortness of breath. Patient not taking: Reported on 09/19/2015 05/04/14   [name], MD  dexamethasone (DECADRON) 1 MG tablet Take 1 tablet by mouth once at 11 pm, before coming for labs at 8 am the next morning Patient not taking: Reported on 09/19/2015 05/05/15   Philemon Kingdom, MD  ergocalciferol (DRISDOL) 50000 units capsule Take by mouth. Reported on 09/19/2015 08/11/13   Historical Provider, MD  estradiol (ESTRACE) 0.5 MG tablet Take 1 tablet (0.5 mg total) by mouth daily. Patient not taking: Reported on 09/19/2015 10/01/12   [name], MD  fluticasone [facility]) 50 MCG/ACT nasal spray Place 2 sprays into both nostrils daily. Patient not taking: Reported on 09/19/2015 08/03/14   Rubbie Battiest, NP    ROS:  Out of a complete 14 system review of symptoms, the patient complains only of the following symptoms, and all other reviewed systems are negative.  Decreased activity, fatigue Ear pain, runny nose, difficulty swallowing Ear discharge, light sensitivity, short of breath, choking Leg swelling Abdominal pain, diarrhea, nausea Incontinence of bowels Restless legs, insomnia, sleep apnea, frequent waking, daytime sleepiness Joint pain, joint swelling, back pain, muscle cramps, walking difficulty, neck pain, neck stiffness Itching Dizziness,  headache Depression, anxiety  Blood pressure 142/84, pulse 82, resp. rate 20, height 5' 4.5" (1.638 m), weight 226 lb (102.513 kg).  Physical Exam  General: The patient is alert and cooperative at the time of the examination. The patient is markedly obese.   Respiratory: Lung fields are clear.  Cardiovascular: Regular rate and rhythm, no murmurs or rubs noted.  Skin: No significant peripheral edema is noted.   Neurologic Exam  Mental status: The patient is alert and oriented x 3 at the time of the examination. The patient has apparent normal recent and remote memory, with an apparently normal attention span and concentration ability.   Cranial nerves: Facial symmetry is present. Speech is normal, no aphasia or dysarthria is noted. Extraocular movements are full. Visual fields are full. Pupils are equal, round, and reactive to light. Discs are flat bilaterally. Speech is normal, midposition tongue, good strength. Good strength of the facial muscles and the muscles to head turning and shoulder shrug bilaterally. Facial sensation is symmetric.  Motor: The patient has good strength in all 4 extremities.  Sensory examination: Soft touch sensation is symmetric on the face, arms, and legs. The patient has a stocking pattern pinprick sensory deficit up to the knees bilaterally. Vibration sensation is significant depressed in both feet, as is position sensation. No evidence of extinction is noted.  Coordination: The patient has good finger-nose-finger and heel-to-shin bilaterally.  Gait and station: The patient has a limping gait on the left leg. Tandem gait is slightly unsteady. Romberg is negative. No drift is seen.  Reflexes: Deep tendon reflexes are symmetric, but are depressed. Toes are neutral bilaterally.   Assessment/Plan:  1. Cervicogenic headache   2. Diabetes, diabetic peripheral neuropathy   3. Gait disorder   4. Mild memory disturbance   5. Cervical spondylosis   6.  Chronic low back pain   The patient will be sent for a CT evaluation of the cervical spine. The Lyrica dose will be increased taking 100 mg in the morning and 200 mg in the evening. The patient will follow-up through this office in about 6 months, sooner if needed. The patient indicates that her diabetes is under  good control, we may be a will consider an epidural steroid injection in the cervical spine in the future. She has an InterStim stimulator in place, she cannot have MRI.   Jill Alexanders MD 09/19/2015 7:56 PM  [facility] [address] [place], [place] [address]  Phone [phone] Fax [phone]

## 2015-09-19 NOTE — Telephone Encounter (Signed)
Dr. Jannifer Franklin did prescribe a 3 month supply, but only gave 2 refills.   Dr. Jannifer Franklin, do you want to increase the refills to 3?

## 2015-09-19 NOTE — Telephone Encounter (Signed)
Lyrica rx. faxed to [facility] fax # [phone], with fax confirmation received/fim

## 2015-09-19 NOTE — Patient Instructions (Signed)

## 2015-09-19 NOTE — Telephone Encounter (Signed)
The patient has a revisit in September, the prescription will certainly last that long.

## 2015-09-20 NOTE — Telephone Encounter (Signed)
Faxed Lyrica RX to [facility]. Received a receipt of confirmation.

## 2015-09-21 DIAGNOSIS — M546 Pain in thoracic spine: Secondary | ICD-10-CM | POA: Diagnosis not present

## 2015-09-21 DIAGNOSIS — M5136 Other intervertebral disc degeneration, lumbar region: Secondary | ICD-10-CM | POA: Diagnosis not present

## 2015-09-21 DIAGNOSIS — M9903 Segmental and somatic dysfunction of lumbar region: Secondary | ICD-10-CM | POA: Diagnosis not present

## 2015-09-21 DIAGNOSIS — M545 Low back pain: Secondary | ICD-10-CM | POA: Diagnosis not present

## 2015-09-21 DIAGNOSIS — M542 Cervicalgia: Secondary | ICD-10-CM | POA: Diagnosis not present

## 2015-09-21 DIAGNOSIS — M5134 Other intervertebral disc degeneration, thoracic region: Secondary | ICD-10-CM | POA: Diagnosis not present

## 2015-09-21 DIAGNOSIS — M9901 Segmental and somatic dysfunction of cervical region: Secondary | ICD-10-CM | POA: Diagnosis not present

## 2015-09-21 DIAGNOSIS — M5030 Other cervical disc degeneration, unspecified cervical region: Secondary | ICD-10-CM | POA: Diagnosis not present

## 2015-09-21 DIAGNOSIS — M9902 Segmental and somatic dysfunction of thoracic region: Secondary | ICD-10-CM | POA: Diagnosis not present

## 2015-09-23 ENCOUNTER — Ambulatory Visit
Admission: RE | Admit: 2015-09-23 | Discharge: 2015-09-23 | Disposition: A | Discharge status: Home / Self Care | Payer: Medicare Other | Source: Ambulatory Visit | Attending: Neurology | Admitting: Neurology

## 2015-09-23 DIAGNOSIS — R51 Headache: Secondary | ICD-10-CM

## 2015-09-23 DIAGNOSIS — G4486 Cervicogenic headache: Secondary | ICD-10-CM

## 2015-09-23 DIAGNOSIS — E1142 Type 2 diabetes mellitus with diabetic polyneuropathy: Secondary | ICD-10-CM

## 2015-09-23 DIAGNOSIS — M4802 Spinal stenosis, cervical region: Secondary | ICD-10-CM | POA: Diagnosis not present

## 2015-09-23 DIAGNOSIS — R269 Unspecified abnormalities of gait and mobility: Secondary | ICD-10-CM

## 2015-09-26 ENCOUNTER — Telehealth: Payer: Self-pay | Admitting: Internal Medicine

## 2015-09-26 ENCOUNTER — Telehealth: Payer: Self-pay | Admitting: Neurology

## 2015-09-26 NOTE — Telephone Encounter (Signed)
She can take both meds. They work differently. Take Lyrica as directed. Nuvigil is only taken in the morning.

## 2015-09-26 NOTE — Telephone Encounter (Signed)
Patient notified of Dr. Janee Morn recommendations. Nothing further needed.

## 2015-09-26 NOTE — Telephone Encounter (Signed)
  I called patient. The CT of the cervical spine that shows some degenerative changes, most prominent the C5-6 level with bilateral foraminal stenosis. The patient has tizanidine to take when she gets her headaches, but this does not work, epidural steroid injections may be done. She is seeing a chiropractor currently.    CT cervical spine results 09/26/15:  IMPRESSION: 1. 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 Chronic severe bilateral C6 foraminal stenosis. 2. Chronic left facet degeneration at C6-C7 with vacuum facet. No definite associated stenosis. 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

## 2015-09-26 NOTE — Telephone Encounter (Signed)
Patient states that she was given a sample of Nuvigil.  Patient is taking Lyrica for Neuropathy.  Patient says that Lyrica has been moved up to 100mg  in AM and 200mg  at night.  Patient wants to know if she should take both of these medications together?  If so, she wants to know how she needs to take them.  Allergies  Allergen Reactions  . Diovan [Valsartan]   . Erythromycin   . Flagyl [Metronidazole]   . Glucophage [Metformin Hcl]   . Xanax [Alprazolam]   . Amoxicillin Diarrhea  . Demeclocycline Hives  . Erythromycin Base   . Metformin   . Sulfa Antibiotics Rash    As child  . Tetracyclines & Related Hives and Rash   Current Outpatient Prescriptions on File Prior to Visit  Medication Sig Dispense Refill  . albuterol (PROVENTIL HFA;VENTOLIN HFA) 108 (90 BASE) MCG/ACT inhaler Inhale 2 puffs into the lungs every 6 (six) hours as needed for wheezing or shortness of breath. (Patient not taking: Reported on 09/19/2015) 2 Inhaler 3  . benzonatate (TESSALON) 200 MG capsule Take 1 capsule (200 mg total) by mouth 3 (three) times daily as needed for cough. 180 capsule 1  . Blood Pressure Monitoring (BLOOD PRESSURE MONITOR/ARM) DEVI Automatic BP machine.  Normal adult size arm 1 Device 0  . Cholecalciferol (VITAMIN D3) 10000 UNITS capsule Take 10,000 Units by mouth once a week.    . cyanocobalamin (,VITAMIN B-12,) 1000 MCG/ML injection 1000 ml (1 ml) injection twice monthly 25 mL 3  . dexamethasone (DECADRON) 1 MG tablet Take 1 tablet by mouth once at 11 pm, before coming for labs at 8 am the next morning (Patient not taking: Reported on 09/19/2015) 1 tablet 0  . diazepam (VALIUM) 10 MG tablet Take 1 tablet (10 mg total) by mouth at bedtime as needed for anxiety. 90 tablet 1  . diphenoxylate-atropine (LOMOTIL) 2.5-0.025 MG per tablet Take 1-2 tablets by mouth 4 (four) times daily as needed for diarrhea or loose stools. 240 tablet 2  . Eluxadoline 100 MG TABS Take by mouth.    . ergocalciferol (DRISDOL)  50000 units capsule Take by mouth. Reported on 09/19/2015    . estradiol (ESTRACE) 0.5 MG tablet Take 1 tablet (0.5 mg total) by mouth daily. (Patient not taking: Reported on 09/19/2015) 90 tablet 2  . eszopiclone (LUNESTA) 2 MG TABS tablet Take 1 tablet (2 mg total) by mouth at bedtime. Take immediately before bedtime 90 tablet 1  . fluticasone (FLONASE) 50 MCG/ACT nasal spray Place 2 sprays into both nostrils daily. (Patient not taking: Reported on 09/19/2015) 16 g 6  . furosemide (LASIX) 20 MG tablet Take 1 tablet (20 mg total) by mouth daily. 90 tablet 3  . Glucose Blood (BAYER BREEZE 2 TEST) DISK Use one test strip each time glucose needs testing. 50 each 2  . hydrALAZINE (APRESOLINE) 25 MG tablet Take 1 tablet (25 mg total) by mouth 3 (three) times daily. As needed for bp > 160 270 tablet 1  . hydrocodone-acetaminophen (LORCET-HD) 5-500 MG per capsule Take 1 capsule by mouth every 6 (six) hours as needed. 30 capsule 3  . loperamide (IMODIUM A-D) 2 MG tablet Take by mouth.    . nebivolol (BYSTOLIC) 5 MG tablet Take 1 tablet (5 mg total) by mouth daily. 90 tablet 1  . nystatin (MYCOSTATIN) powder Apply topically 2 (two) times daily. 60 g 3  . oxyCODONE-acetaminophen (PERCOCET/ROXICET) 5-325 MG per tablet Take 1 tablet by mouth every 6 (six)  hours as needed. 30 tablet 0  . pregabalin (LYRICA) 100 MG capsule One capsule in the morning and two in the evening 270 capsule 2  . rOPINIRole (REQUIP) 1 MG tablet Take 1 tablet (1 mg total) by mouth at bedtime. 90 tablet 2  . sitaGLIPtin (JANUVIA) 100 MG tablet Take 1 tablet (100 mg total) by mouth daily. 90 tablet 2  . Tdap (BOOSTRIX) 5-2.5-18.5 LF-MCG/0.5 injection Inject 0.5 mLs into the muscle once. 0.5 mL 0  . traMADol (ULTRAM) 50 MG tablet Take 50 mg by mouth 4 (four) times daily as needed.    . zoster vaccine live, PF, (ZOSTAVAX) 09811 UNT/0.65ML injection Inject 19,400 Units into the skin once. 1 each 0   No current facility-administered medications on  file prior to visit.

## 2015-09-28 NOTE — Telephone Encounter (Signed)
Mailed unread message to patient.  

## 2015-10-10 ENCOUNTER — Encounter: Payer: Self-pay | Admitting: Gastroenterology

## 2015-10-10 ENCOUNTER — Ambulatory Visit (INDEPENDENT_AMBULATORY_CARE_PROVIDER_SITE_OTHER): Payer: Medicare Other | Admitting: Gastroenterology

## 2015-10-10 VITALS — BP 139/69 | HR 57 | Temp 98.3°F | Ht 64.5 in | Wt 236.0 lb

## 2015-10-10 DIAGNOSIS — K529 Noninfective gastroenteritis and colitis, unspecified: Secondary | ICD-10-CM

## 2015-10-10 MED ORDER — ALOSETRON HCL 1 MG PO TABS: 1.0000 mg | ORAL_TABLET | Freq: Two times a day (BID) | ORAL | Status: DC

## 2015-10-10 NOTE — Progress Notes (Signed)
Primary Care Physician: [name], MD  Primary Gastroenterologist:  Dr. [name]  Chief Complaint  Patient presents with  . Diarrhea    HPI: Denise Sharp is a 70 y.o. female here who has a history of irritable bowel syndrome with diarrhea. The patient did well on Viberzi but did better on Lotronex. The patient was unable to take either one of these because of her insurance coverage. [facility]. She reports that on the way to the office visit today she has to change her close because she soiled herself. She has not had any weight loss.  Current Outpatient Prescriptions  Medication Sig Dispense Refill  . albuterol (PROVENTIL HFA;VENTOLIN HFA) 108 (90 BASE) MCG/ACT inhaler Inhale 2 puffs into the lungs every 6 (six) hours as needed for wheezing or shortness of breath. 2 Inhaler 3  . benzonatate (TESSALON) 200 MG capsule Take 1 capsule (200 mg total) by mouth 3 (three) times daily as needed for cough. 180 capsule 1  . Blood Pressure Monitoring (BLOOD PRESSURE MONITOR/ARM) DEVI Automatic BP machine.  Normal adult size arm 1 Device 0  . Cholecalciferol (VITAMIN D3) 10000 UNITS capsule Take 10,000 Units by mouth once a week.    . cyanocobalamin (,VITAMIN B-12,) 1000 MCG/ML injection 1000 ml (1 ml) injection twice monthly 25 mL 3  . diazepam (VALIUM) 10 MG tablet Take 1 tablet (10 mg total) by mouth at bedtime as needed for anxiety. 90 tablet 1  . Eluxadoline 100 MG TABS Take by mouth.    . ergocalciferol (DRISDOL) 50000 units capsule Take by mouth. Reported on 09/19/2015    . estradiol (ESTRACE) 0.5 MG tablet Take 1 tablet (0.5 mg total) by mouth daily. 90 tablet 2  . eszopiclone (LUNESTA) 2 MG TABS tablet Take 1 tablet (2 mg total) by mouth at bedtime. Take immediately before bedtime 90 tablet 1  . furosemide (LASIX) 20 MG tablet Take 1 tablet (20 mg total) by mouth daily. 90 tablet 3  . Glucose Blood (BAYER BREEZE 2 TEST) DISK Use one test strip each  time glucose needs testing. 50 each 2  . hydrALAZINE (APRESOLINE) 25 MG tablet Take 1 tablet (25 mg total) by mouth 3 (three) times daily. As needed for bp > 160 270 tablet 1  . hydrocodone-acetaminophen (LORCET-HD) 5-500 MG per capsule Take 1 capsule by mouth every 6 (six) hours as needed. 30 capsule 3  . loperamide (IMODIUM A-D) 2 MG tablet Take by mouth.    . nebivolol (BYSTOLIC) 5 MG tablet Take 1 tablet (5 mg total) by mouth daily. 90 tablet 1  . nystatin (MYCOSTATIN) powder Apply topically 2 (two) times daily. 60 g 3  . oxyCODONE-acetaminophen (PERCOCET/ROXICET) 5-325 MG per tablet Take 1 tablet by mouth every 6 (six) hours as needed. 30 tablet 0  . pregabalin (LYRICA) 100 MG capsule One capsule in the morning and two in the evening 270 capsule 2  . rOPINIRole (REQUIP) 1 MG tablet Take 1 tablet (1 mg total) by mouth at bedtime. 90 tablet 2  . sitaGLIPtin (JANUVIA) 100 MG tablet Take 1 tablet (100 mg total) by mouth daily. 90 tablet 2  . traMADol (ULTRAM) 50 MG tablet Take 50 mg by mouth 4 (four) times daily as needed.    [name] alosetron (LOTRONEX) 1 MG tablet Take 1 tablet (1 mg total) by mouth 2 (two) times daily. 60 tablet 11  . diphenoxylate-atropine (LOMOTIL) 2.5-0.025 MG per tablet Take 1-2 tablets by mouth  4 (four) times daily as needed for diarrhea or loose stools. (Patient not taking: Reported on 10/10/2015) 240 tablet 2  . fluticasone (FLONASE) 50 MCG/ACT nasal spray Place 2 sprays into both nostrils daily. (Patient not taking: Reported on 10/10/2015) 16 g 6  . Tdap (BOOSTRIX) 5-2.5-18.5 LF-MCG/0.5 injection Inject 0.5 mLs into the muscle once. (Patient not taking: Reported on 10/10/2015) 0.5 mL 0  . zoster vaccine live, PF, (ZOSTAVAX) 16109 UNT/0.65ML injection Inject 19,400 Units into the skin once. (Patient not taking: Reported on 10/10/2015) 1 each 0   No current facility-administered medications for this visit.    Allergies as of 10/10/2015 - Review Complete 10/10/2015  Allergen  Reaction Noted  . Diovan [valsartan]  08/17/2011  . Erythromycin  08/17/2011  . Flagyl [metronidazole]  08/17/2011  . Glucophage [metformin hcl]  08/17/2011  . Xanax [alprazolam]  08/17/2011  . Amoxicillin Diarrhea 05/04/2014  . Demeclocycline Hives 09/18/2013  . Erythromycin base  11/23/2013  . Metformin  09/18/2013  . Sulfa antibiotics Rash 09/18/2013  . Tetracyclines & related Hives and Rash 03/12/2011    ROS:  General: Negative for anorexia, weight loss, fever, chills, fatigue, weakness. ENT: Negative for hoarseness, difficulty swallowing , nasal congestion. CV: Negative for chest pain, angina, palpitations, dyspnea on exertion, peripheral edema.  Respiratory: Negative for dyspnea at rest, dyspnea on exertion, cough, sputum, wheezing.  GI: See history of present illness. GU:  Negative for dysuria, hematuria, urinary incontinence, urinary frequency, nocturnal urination.  Endo: Negative for unusual weight change.    Physical Examination:   BP 139/69 mmHg  Pulse 57  Temp(Src) 98.3 F (36.8 C) (Oral)  Ht 5' 4.5" (1.638 m)  Wt 236 lb (107.049 kg)  BMI 39.90 kg/m2  General: Well-nourished, well-developed in no acute distress.  Eyes: No icterus. Conjunctivae pink. Mouth: Oropharyngeal mucosa moist and pink , no lesions erythema or exudate. Lungs: Clear to auscultation bilaterally. Non-labored. Heart: Regular rate and rhythm, no murmurs rubs or gallops.  Abdomen: Bowel sounds are normal, nontender, nondistended, no hepatosplenomegaly or masses, no abdominal bruits or hernia , no rebound or guarding.   Extremities: No lower extremity edema. No clubbing or deformities. Neuro: Alert and oriented x 3.  Grossly intact. Skin: Warm and dry, no jaundice.   Psych: Alert and cooperative, normal mood and affect.  Labs:    Imaging Studies: Ct Cervical Spine Wo Contrast  09/23/2015  CLINICAL DATA:  70 year old female with cervical neck pain and associated headaches. Remote neck injury  in the 1990s. Initial encounter. EXAM: CT CERVICAL SPINE WITHOUT CONTRAST TECHNIQUE: Multidetector CT imaging of the cervical spine was performed without intravenous contrast. Multiplanar CT image reconstructions were also generated. COMPARISON:  [facility] cervical spine MRI 02/23/2010. FINDINGS: Chronic straightening of cervical lordosis. Visualized skull base is intact. No atlanto-occipital dissociation. Chronic bulky degenerative osteophytosis about the anterior C1-odontoid articulation. Associated dystrophic calcifications at the origins of the longus coli muscles (series 3, image 14). Bilateral posterior element alignment is within normal limits. Cervicothoracic junction alignment is within normal limits. No acute osseous abnormality identified. Visualized mastoid air cells are clear. Negative visualized lung apices. Calcified carotid bifurcation atherosclerosis greater on the left. Otherwise negative visualized noncontrast paraspinal soft tissues. Calcified atherosclerosis at the skull base. C2-C3:  Negative. C3-C4: Right greater than left uncovertebral and facet hypertrophy. Broad-based central disc protrusion. Mild ligament flavum hypertrophy. Suspected mild spinal stenosis. Mild right C4 foraminal stenosis. C4-C5: Disc space loss. Circumferential disc osteophyte complex. No spinal stenosis suspected. Uncovertebral hypertrophy. Mild  to moderate right C5 foraminal stenosis. C5-C6: Disc space loss. Circumferential disc osteophyte complex with broad-based posterior component of disc. Mild to moderate spinal stenosis. Uncovertebral hypertrophy with severe bilateral C6 foraminal stenosis. C6-C7: Mild to moderate left facet hypertrophy with trace vacuum facet. Disc bulge. No definite stenosis. C7-T1: Mild facet hypertrophy. Mild uncovertebral hypertrophy. No definite stenosis. No CT evidence of upper thoracic spinal stenosis. IMPRESSION: 1. 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 2. Chronic left facet degeneration at C6-C7 with vacuum facet. No definite associated stenosis. 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 Electronically Signed   By: Genevie Ann M.D.   On: 09/23/2015 15:44    Assessment and Plan:   Denise Sharp is a 70 y.o. y/o female who has a history of irritable bowel syndrome with diarrhea. The patient has been tried on Viberzi in the past but cannot be started on it again because she has had her gallbladder removed and is now a report of increased pancreatitis. The patient will be started back on Lotronex. The patient states that she is willing to by now because she cannot live with the diarrhea. I have told her that we will contact the drug company and see if we can get her a discount on the medication.   Note: This dictation was prepared with Dragon dictation along with smaller phrase technology. Any transcriptional errors that result from this process are unintentional.

## 2015-10-11 DIAGNOSIS — F419 Anxiety disorder, unspecified: Secondary | ICD-10-CM | POA: Insufficient documentation

## 2015-10-17 ENCOUNTER — Ambulatory Visit: Payer: Medicare Other | Admitting: Internal Medicine

## 2015-11-03 ENCOUNTER — Ambulatory Visit (INDEPENDENT_AMBULATORY_CARE_PROVIDER_SITE_OTHER): Payer: Medicare Other

## 2015-11-03 ENCOUNTER — Other Ambulatory Visit: Payer: Self-pay | Admitting: Internal Medicine

## 2015-11-03 VITALS — BP 118/80 | HR 70 | Temp 98.3°F | Resp 14 | Ht 63.0 in | Wt 234.8 lb

## 2015-11-03 DIAGNOSIS — Z0000 Encounter for general adult medical examination without abnormal findings: Secondary | ICD-10-CM

## 2015-11-03 DIAGNOSIS — D509 Iron deficiency anemia, unspecified: Secondary | ICD-10-CM

## 2015-11-03 DIAGNOSIS — E2839 Other primary ovarian failure: Secondary | ICD-10-CM

## 2015-11-03 DIAGNOSIS — Z760 Encounter for issue of repeat prescription: Secondary | ICD-10-CM | POA: Diagnosis not present

## 2015-11-03 DIAGNOSIS — E1142 Type 2 diabetes mellitus with diabetic polyneuropathy: Secondary | ICD-10-CM

## 2015-11-03 NOTE — Patient Instructions (Addendum)
Denise Sharp , Thank you for taking time to come for your Medicare Wellness Visit. I appreciate your ongoing commitment to your health goals. Please review the following plan we discussed and let me know if I can assist you in the future.   Return next month for physical appointment with [name].  DEXA Scan as directed.  Bring handicap paperwork for placard.  Call Medicare insurance about BP cuff coverage.  Contact information provided.   This is a list of the screening recommended for you and due dates:  Health Maintenance  Topic Date Due  .  Hepatitis C: One time screening is recommended by Center for Disease Control  (CDC) for  adults born from [birth year] through 1965.   August 06, 1945  . Tetanus Vaccine  09/23/1964  . Shingles Vaccine  09/23/2005  . DEXA scan (bone density measurement)  09/24/2010  . Eye exam for diabetics  11/13/2014  . Pneumonia vaccines (2 of 2 - PPSV23) 01/20/2015  . Hemoglobin A1C  12/15/2015  . Flu Shot  02/14/2016  . Urine Protein Check  06/15/2016  . Mammogram  06/22/2016  . Complete foot exam   08/15/2016  . Colon Cancer Screening  01/07/2024    Fall Prevention in the Home  Falls can cause injuries. They can happen to people of all ages. There are many things you can do to make your home safe and to help prevent falls.  WHAT CAN I DO ON THE OUTSIDE OF MY HOME?  Regularly fix the edges of walkways and driveways and fix any cracks.  Remove anything that might make you trip as you walk through a door, such as a raised step or threshold.  Trim any bushes or trees on the path to your home.  Use bright outdoor lighting.  Clear any walking paths of anything that might make someone trip, such as rocks or tools.  Regularly check to see if handrails are loose or broken. Make sure that both sides of any steps have handrails.  Any raised decks and porches should have guardrails on the edges.  Have any leaves, snow, or ice cleared regularly.  Use sand or  salt on walking paths during winter.  Clean up any spills in your garage right away. This includes oil or grease spills. WHAT CAN I DO IN THE BATHROOM?   Use night lights.  Install grab bars by the toilet and in the tub and shower. Do not use towel bars as grab bars.  Use non-skid mats or decals in the tub or shower.  If you need to sit down in the shower, use a plastic, non-slip stool.  Keep the floor dry. Clean up any water that spills on the floor as soon as it happens.  Remove soap buildup in the tub or shower regularly.  Attach bath mats securely with double-sided non-slip rug tape.  Do not have throw rugs and other things on the floor that can make you trip. WHAT CAN I DO IN THE BEDROOM?  Use night lights.  Make sure that you have a light by your bed that is easy to reach.  Do not use any sheets or blankets that are too big for your bed. They should not hang down onto the floor.  Have a firm chair that has side arms. You can use this for support while you get dressed.  Do not have throw rugs and other things on the floor that can make you trip. WHAT CAN I DO IN THE KITCHEN?  Clean up any spills right away.  Avoid walking on wet floors.  Keep items that you use a lot in easy-to-reach places.  If you need to reach something above you, use a strong step stool that has a grab bar.  Keep electrical cords out of the way.  Do not use floor polish or wax that makes floors slippery. If you must use wax, use non-skid floor wax.  Do not have throw rugs and other things on the floor that can make you trip. WHAT CAN I DO WITH MY STAIRS?  Do not leave any items on the stairs.  Make sure that there are handrails on both sides of the stairs and use them. Fix handrails that are broken or loose. Make sure that handrails are as long as the stairways.  Check any carpeting to make sure that it is firmly attached to the stairs. Fix any carpet that is loose or worn.  Avoid having  throw rugs at the top or bottom of the stairs. If you do have throw rugs, attach them to the floor with carpet tape.  Make sure that you have a light switch at the top of the stairs and the bottom of the stairs. If you do not have them, ask someone to add them for you. WHAT ELSE CAN I DO TO HELP PREVENT FALLS?  Wear shoes that:  Do not have high heels.  Have rubber bottoms.  Are comfortable and fit you well.  Are closed at the toe. Do not wear sandals.  If you use a stepladder:  Make sure that it is fully opened. Do not climb a closed stepladder.  Make sure that both sides of the stepladder are locked into place.  Ask someone to hold it for you, if possible.  Clearly mark and make sure that you can see:  Any grab bars or handrails.  First and last steps.  Where the edge of each step is.  Use tools that help you move around (mobility aids) if they are needed. These include:  Canes.  Walkers.  Scooters.  Crutches.  Turn on the lights when you go into a dark area. Replace any light bulbs as soon as they burn out.  Set up your furniture so you have a clear path. Avoid moving your furniture around.  If any of your floors are uneven, fix them.  If there are any pets around you, be aware of where they are.  Review your medicines with your doctor. Some medicines can make you feel dizzy. This can increase your chance of falling. Ask your doctor what other things that you can do to help prevent falls.   This information is not intended to replace advice given to you by your health care provider. Make sure you discuss any questions you have with your health care provider.   Document Released: 04/28/2009 Document Revised: 11/16/2014 Document Reviewed: 08/06/2014 Elsevier Interactive Patient Education [employer].

## 2015-11-03 NOTE — Progress Notes (Signed)
Subjective:   Denise Sharp is a 70 y.o. female who presents for an Initial Medicare Annual Wellness Visit.  Review of Systems    No ROS.  Medicare Wellness Visit.  Cardiac Risk Factors include: diabetes mellitus;advanced age (>55men, >64 women);hypertension     Objective:    Today's Vitals   11/03/15 1452  BP: 118/80  Pulse: 70  Temp: 98.3 F (36.8 C)  TempSrc: Oral  Resp: 14  Height: 5\' 3"  (1.6 m)  Weight: 234 lb 12.8 oz (106.505 kg)  SpO2: 96%   Body mass index is 41.6 kg/(m^2).   Current Medications (verified) Outpatient Encounter Prescriptions as of 11/03/2015  Medication Sig  . albuterol (PROVENTIL HFA;VENTOLIN HFA) 108 (90 BASE) MCG/ACT inhaler Inhale 2 puffs into the lungs every 6 (six) hours as needed for wheezing or shortness of breath.  . alosetron (LOTRONEX) 1 MG tablet Take 1 tablet (1 mg total) by mouth 2 (two) times daily.  . benzonatate (TESSALON) 200 MG capsule Take 1 capsule (200 mg total) by mouth 3 (three) times daily as needed for cough.  . Blood Pressure Monitoring (BLOOD PRESSURE MONITOR/ARM) DEVI Automatic BP machine.  Normal adult size arm  . Cholecalciferol (VITAMIN D3) 10000 UNITS capsule Take 10,000 Units by mouth once a week.  . cyanocobalamin (,VITAMIN B-12,) 1000 MCG/ML injection 1000 ml (1 ml) injection twice monthly  . diazepam (VALIUM) 10 MG tablet Take 1 tablet (10 mg total) by mouth at bedtime as needed for anxiety.  . diphenoxylate-atropine (LOMOTIL) 2.5-0.025 MG per tablet Take 1-2 tablets by mouth 4 (four) times daily as needed for diarrhea or loose stools.  . Eluxadoline 100 MG TABS Take by mouth.  . ergocalciferol (DRISDOL) 50000 units capsule Take by mouth. Reported on 09/19/2015  . eszopiclone (LUNESTA) 2 MG TABS tablet Take 1 tablet (2 mg total) by mouth at bedtime. Take immediately before bedtime  . fluticasone (FLONASE) 50 MCG/ACT nasal spray Place 2 sprays into both nostrils daily.  . Glucose Blood (BAYER BREEZE 2 TEST) DISK  Use one test strip each time glucose needs testing.  . hydrALAZINE (APRESOLINE) 25 MG tablet Take 1 tablet (25 mg total) by mouth 3 (three) times daily. As needed for bp > 160  . hydrocodone-acetaminophen (LORCET-HD) 5-500 MG per capsule Take 1 capsule by mouth every 6 (six) hours as needed.  . loperamide (IMODIUM A-D) 2 MG tablet Take by mouth.  . nebivolol (BYSTOLIC) 5 MG tablet Take 1 tablet (5 mg total) by mouth daily.  [name] nystatin (MYCOSTATIN) powder Apply topically 2 (two) times daily.  [name] oxyCODONE-acetaminophen (PERCOCET/ROXICET) 5-325 MG per tablet Take 1 tablet by mouth every 6 (six) hours as needed.  . pregabalin (LYRICA) 100 MG capsule One capsule in the morning and two in the evening  . rOPINIRole (REQUIP) 1 MG tablet Take 1 tablet (1 mg total) by mouth at bedtime.  . sitaGLIPtin (JANUVIA) 100 MG tablet Take 1 tablet (100 mg total) by mouth daily.  [name] tiZANidine (ZANAFLEX) 2 MG tablet Take by mouth 3 (three) times daily.  . traMADol (ULTRAM) 50 MG tablet Take 50 mg by mouth 4 (four) times daily as needed.  . [DISCONTINUED] furosemide (LASIX) 20 MG tablet Take 1 tablet (20 mg total) by mouth daily.  [name] estradiol (ESTRACE) 0.5 MG tablet Take 1 tablet (0.5 mg total) by mouth daily. (Patient not taking: Reported on 11/03/2015)  . Tdap (BOOSTRIX) 5-2.5-18.5 LF-MCG/0.5 injection Inject 0.5 mLs into the muscle once. (Patient not taking: Reported on 11/03/2015)  .  zoster vaccine live, PF, (ZOSTAVAX) 03474 UNT/0.65ML injection Inject 19,400 Units into the skin once. (Patient not taking: Reported on 11/03/2015)   No facility-administered encounter medications on file as of 11/03/2015.    Allergies (verified) Diovan; Erythromycin; Flagyl; Glucophage; Xanax; Amoxicillin; Demeclocycline; Erythromycin base; Metformin; Sulfa antibiotics; and Tetracyclines & related   History: Past Medical History  Diagnosis Date  . Diabetes mellitus   . Depression   . Hypertension   . Cervical spinal stenosis 1994     due to trauma to back ([employer] accident), has intermittent paralysis and parasthesias  . Adrenal mass ([place])   . IBS (irritable bowel syndrome)   . Obstructive sleep apnea     using CPAP  . Anemia     iron deficient post  2 unit txfsn 2009, normal endo/colonoscopy by [facility]  . Esophageal stenosis March 2011    with transietn outlet obstruction by food, cleared by EGD   . LBBB (left bundle branch block)   . Obesity   . Apnea   . Headache(784.0)   . Arthritis   . Restless leg syndrome   . Dizziness     chronic dizziness  . Adrenal mass, left ([place])   . 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 Abnormality of gait 03/25/2013  . Syncope and collapse 03/12/2014  . Cervicogenic headache 03/23/2014   Past Surgical History  Procedure Laterality Date  . Gastric bypass    . Abdominal hysterectomy    . Gastric bypass  2000, 2005    Dr. Debroah Loop  . Appendectomy    . Gallbladder surgery  1976  . Rotator cuff repair      right  . Joint replacement  2007    bilateral knee. [name],  [name]  . Spine surgery  1995    [place]  . Interstim implant placement  April 2013    [place]  . Gallbladder surgery  resection  . Total abdominal hysterectomy w/ bilateral salpingoophorectomy  1974  . Umbilical hernia repair  Aug 11, 2015  . Diaphragmatic hernia repair  2015   Family History  Problem Relation Age of Onset  . Heart disease Father   . Hypertension Father   . Prostate cancer Father   . Stroke Father   . Osteoporosis Father   . Stroke Mother   . Depression Mother   . Headache Mother   . Heart disease Mother   . Thyroid disease Mother   . Hypertension Mother   . Diabetes Daughter   . Heart disease Daughter   . Hypertension Daughter   . Hypertension Son    Social History   Occupational History  .     Social History Main Topics  . Smoking status: Never Smoker   . Smokeless tobacco: Never Used  . Alcohol Use: No  . Drug Use: No  . Sexual Activity: Not Currently    Tobacco  Counseling Counseling given: Not Answered   Activities of Daily Living In your present state of health, do you have any difficulty performing the following activities: 11/03/2015  Hearing? N  Vision? N  Difficulty concentrating or making decisions? N  Walking or climbing stairs? Y  Dressing or bathing? N  Doing errands, shopping? N  Preparing Food and eating ? N  Using the Toilet? N  In the past six months, have you accidently leaked urine? N  Do you have problems with loss of bowel control? Y  Managing your Medications? N  Managing your Finances? N  Housekeeping or managing your Housekeeping?  N    Immunizations and Health Maintenance Immunization History  Administered Date(s) Administered  . Influenza Split 06/13/2011  . Influenza,inj,Quad PF,36+ Mos 05/04/2014  . Influenza-Unspecified 04/15/2012  . Pneumococcal Conjugate-13 01/06/2014  . Pneumococcal Polysaccharide-23 01/19/2010   Health Maintenance Due  Topic Date Due  . Hepatitis C Screening  1946-06-30  . TETANUS/TDAP  09/23/1964  . ZOSTAVAX  09/23/2005  . DEXA SCAN  09/24/2010  . PNA vac Low Risk Adult (2 of 2 - PPSV23) 01/20/2015    Patient Care Team: [name], MD as PCP - General (Internal Medicine) [facility] as Referring Physician  Indicate any recent Medical Services you may have received from other than [facility] providers in the past year (date may be approximate).     Assessment:   This is a routine wellness examination for Denise Sharp. The goal of the wellness visit is to assist the patient how to close the gaps in care and create a preventative care plan for the patient.   Taking VIT D Calcium as appropriate/Osteoporosis risk reviewed.  DEXA Scan order placed.  Medications reviewed; taking without issues or barriers.  Safety issues reviewed; smoke detectors in the home. No firearms in the home. Wears seatbelts when driving or riding with others. No violence in the home.  No identified  risk were noted; The patient was oriented x 3; appropriate in dress and manner and no objective failures at ADL's or IADL's.    Hearing/Vision screen Hearing Screening Comments: Followed by [facility] Passes the whisper test Vision Screening Comments: Followed by Dr. Gloriann Loan Annual visits Last OV 01/2015 Wears 1 contact in R eye only for distance Wears trifocals   Dental; Last OV 10/2015  Type 2 diabetes mell-stable and followed by PCP. Morbid (severe) obesity with alveolar hypoventilat- stable and followed by PCP. 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 DMII neuro nt st uncntrl- stable and followed by PCP. Neuropathy in diabetes- stable and followed by Dr. Jannifer Franklin and PCP.  ZOSTAVAX and TDAP vaccine postponed for follow up with insurance.  Patient Concerns:  Need for CPE; scheduled today.  PNA23 booster and Hepatits C screening to be completed at this time, per request.    Dietary issues and exercise activities discussed: Current Exercise Habits: The patient does not participate in regular exercise at present  Goals    . Healthy Lifestyle     Stay hydrated and drink plenty of fluids Low carb foods.  Lean meats and vegetables Stay active. Chair exercises.  Walking as much as possible.      Depression Screen PHQ 2/9 Scores 11/03/2015 06/23/2014  PHQ - 2 Score 2 0  PHQ- 9 Score 4 -    Fall Risk Fall Risk  11/03/2015 06/23/2014  Falls in the past year? Yes Yes  Number falls in past yr: 2 or more -  Injury with Fall? No -  Risk for fall due to : Impaired balance/gait Impaired balance/gait;Impaired mobility  Follow up Falls prevention discussed -    Cognitive Function: MMSE - Mini Mental State Exam 11/03/2015 11/03/2015  Orientation to time 5 5  Orientation to Place 5 5  Registration 3 3  Attention/ Calculation 5 5  Recall 3 3  Language- name 2 objects 2 2  Language- repeat 1 1  Language- follow 3 step command 3 3  Language- read &  follow direction 1 1  Write a sentence 1 1  Copy design 1 1  Total score 30 30  Screening Tests Health Maintenance  Topic Date Due  . Hepatitis C Screening  [birth year]  . TETANUS/TDAP  09/23/1964  . ZOSTAVAX  09/23/2005  . DEXA SCAN  09/24/2010  . PNA vac Low Risk Adult (2 of 2 - PPSV23) 01/20/2015  . HEMOGLOBIN A1C  12/15/2015  . OPHTHALMOLOGY EXAM  01/25/2016  . INFLUENZA VACCINE  02/14/2016  . URINE MICROALBUMIN  06/15/2016  . MAMMOGRAM  06/22/2016  . FOOT EXAM  08/15/2016  . COLONOSCOPY  01/07/2024      Plan:   End of life planning; Advance aging; Advanced directives discussed. Copy of current HCPOA/Living Will on file.    During the course of the visit, Tiye was educated and counseled about the following appropriate screening and preventive services:   Vaccines to include Pneumoccal, Influenza, Hepatitis B, Td, Zostavax, HCV  Electrocardiogram  Cardiovascular disease screening  Colorectal cancer screening  Bone density screening  Diabetes screening  Glaucoma screening  Mammography/PAP  Nutrition counseling  Smoking cessation counseling  Patient Instructions (the written plan) were given to the patient.    Varney Biles, LPN   [id number]

## 2015-11-03 NOTE — Progress Notes (Signed)
  I have reviewed the above information and agree with above.   Anarely Nicholls, MD 

## 2015-11-04 ENCOUNTER — Other Ambulatory Visit: Payer: Self-pay

## 2015-11-04 ENCOUNTER — Telehealth: Payer: Self-pay

## 2015-11-04 DIAGNOSIS — Z760 Encounter for issue of repeat prescription: Secondary | ICD-10-CM

## 2015-11-04 MED ORDER — GLUCOSE BLOOD VI DISK: DISK | Status: DC

## 2015-11-04 NOTE — Telephone Encounter (Signed)
Left VM to return a call to the office to schedule fasting lab appointment prior to upcoming physical.

## 2015-11-06 MED ORDER — ROPINIROLE HCL 1 MG PO TABS: 1.0000 mg | ORAL_TABLET | Freq: Every day | ORAL | Status: DC

## 2015-11-06 MED ORDER — NEBIVOLOL HCL 5 MG PO TABS: 5.0000 mg | ORAL_TABLET | Freq: Every day | ORAL | Status: DC

## 2015-11-06 NOTE — Telephone Encounter (Signed)
Ok to refill,  Refill sent  

## 2015-11-08 ENCOUNTER — Encounter: Payer: Self-pay | Admitting: Internal Medicine

## 2015-11-15 ENCOUNTER — Encounter: Payer: Self-pay | Admitting: Podiatry

## 2015-11-15 ENCOUNTER — Ambulatory Visit (INDEPENDENT_AMBULATORY_CARE_PROVIDER_SITE_OTHER): Payer: Medicare Other | Admitting: Podiatry

## 2015-11-15 ENCOUNTER — Ambulatory Visit: Admitting: Podiatry

## 2015-11-15 ENCOUNTER — Ambulatory Visit (INDEPENDENT_AMBULATORY_CARE_PROVIDER_SITE_OTHER): Payer: Medicare Other

## 2015-11-15 ENCOUNTER — Telehealth: Payer: Self-pay | Admitting: *Deleted

## 2015-11-15 VITALS — BP 112/65 | HR 102 | Resp 18

## 2015-11-15 DIAGNOSIS — L03115 Cellulitis of right lower limb: Secondary | ICD-10-CM

## 2015-11-15 DIAGNOSIS — M79676 Pain in unspecified toe(s): Secondary | ICD-10-CM

## 2015-11-15 DIAGNOSIS — R52 Pain, unspecified: Secondary | ICD-10-CM | POA: Diagnosis not present

## 2015-11-15 DIAGNOSIS — E1149 Type 2 diabetes mellitus with other diabetic neurological complication: Secondary | ICD-10-CM | POA: Diagnosis not present

## 2015-11-15 DIAGNOSIS — B351 Tinea unguium: Secondary | ICD-10-CM

## 2015-11-15 MED ORDER — CLARITHROMYCIN 250 MG PO TABS: 250.0000 mg | ORAL_TABLET | Freq: Two times a day (BID) | ORAL | Status: DC

## 2015-11-19 NOTE — Progress Notes (Signed)
Patient ID: Denise Sharp, female   DOB: August 06, 1945, 70 y.o.   MRN: [id number]  Subjective: 70 y.o. returns the office today for painful, elongated, thickened toenails which she cannot trim herself. Denies any redness or drainage around the nails. She states that last week she was at the beach and she knows that her right leg is become swollen and red and she has cellulitis. She did not want to go to the emergency room she is at the beach. She says the redness has improved however does remain. She denies any drainage or open sores at this time. Denies any systemic complaints such as fevers, chills, nausea, vomiting.   Objective: AAO 3, NAD DP/PT pulses palpable, CRT less than 3 seconds Protective sensation decreased with Simms Weinstein monofilament Nails hypertrophic, dystrophic, elongated, brittle, discolored 10. There is tenderness overlying the nails 1-5 bilaterally. There is no surrounding erythema or drainage along the nail sites. There is mild edema to the right foot, ankle and there is what appears to be resolving cellulitis. Warmth is remaining. There is no areas of fluctuance or crepitus. There is no pain with calf compression, swelling, warmth, erythema this time. No open lesions or pre-ulcerative lesions are identified. No other areas of tenderness bilateral lower extremities. No overlying edema, erythema, increased warmth.  Assessment: Patient presents with symptomatic onychomycosis; right leg cellulitis  Plan: -Treatment options including alternatives, risks, complications were discussed -Nails sharply debrided 10 without complication/bleeding. -I had a very long discussion in regard to treatment options with the patient for synovitis. She has multiple allergies and she cannot tolerate antibiotics. She said the only antibiotic that she can take his Biaxin. Although she is not the and medical choice for this infection I did prescribe this given her issues of the other  antibiotics. If is a signs or symptoms of worsening infection she is going immediately to the emergency room. X-rays were obtained today which did not reveal any definitive evidence of cortical changes suggestive of osteomyelitis however there is significant arthritic changes present. -Discussed daily foot inspection. If there are any changes, to call the office immediately.  -Follow-up as scheduled or sooner if any problems are to arise. In the meantime, encouraged to call the office with any questions, concerns, changes symptoms.  Celesta Gentile, DPM

## 2015-11-22 ENCOUNTER — Ambulatory Visit: Payer: Medicare Other | Admitting: Podiatry

## 2015-11-24 ENCOUNTER — Other Ambulatory Visit (INDEPENDENT_AMBULATORY_CARE_PROVIDER_SITE_OTHER): Payer: Medicare Other

## 2015-11-24 DIAGNOSIS — D509 Iron deficiency anemia, unspecified: Secondary | ICD-10-CM

## 2015-11-24 DIAGNOSIS — E1142 Type 2 diabetes mellitus with diabetic polyneuropathy: Secondary | ICD-10-CM

## 2015-11-24 LAB — COMPREHENSIVE METABOLIC PANEL
ALT: 11 U/L (ref 0–35)
AST: 14 U/L (ref 0–37)
Albumin: 3.8 g/dL (ref 3.5–5.2)
Alkaline Phosphatase: 126 U/L — ABNORMAL HIGH (ref 39–117)
BUN: 19 mg/dL (ref 6–23)
CO2: 23 mEq/L (ref 19–32)
Calcium: 8.9 mg/dL (ref 8.4–10.5)
Chloride: 107 mEq/L (ref 96–112)
Creatinine, Ser: 0.97 mg/dL (ref 0.40–1.20)
GFR: 60.31 mL/min (ref >60.00)
Glucose, Bld: 105 mg/dL — ABNORMAL HIGH (ref 70–99)
Potassium: 4 mEq/L (ref 3.5–5.1)
Sodium: 142 mEq/L (ref 135–145)
Total Bilirubin: 0.5 mg/dL (ref 0.2–1.2)
Total Protein: 7 g/dL (ref 6.0–8.3)

## 2015-11-24 LAB — CBC WITH DIFFERENTIAL/PLATELET
Basophils Absolute: 0.1 10*3/uL (ref 0.0–0.1)
Basophils Relative: 0.6 % (ref 0.0–3.0)
Eosinophils Absolute: 0.2 10*3/uL (ref 0.0–0.7)
Eosinophils Relative: 2.1 % (ref 0.0–5.0)
HCT: 33.5 % — ABNORMAL LOW (ref 36.0–46.0)
Hemoglobin: 10.8 g/dL — ABNORMAL LOW (ref 12.0–15.0)
Lymphocytes Relative: 28.8 % (ref 12.0–46.0)
Lymphs Abs: 2.7 10*3/uL (ref 0.7–4.0)
MCHC: 32.3 g/dL (ref 30.0–36.0)
MCV: 81.8 fl (ref 78.0–100.0)
Monocytes Absolute: 0.6 10*3/uL (ref 0.1–1.0)
Monocytes Relative: 6.9 % (ref 3.0–12.0)
Neutro Abs: 5.8 10*3/uL (ref 1.4–7.7)
Neutrophils Relative %: 61.6 % (ref 43.0–77.0)
Platelets: 296 10*3/uL (ref 150.0–400.0)
RBC: 4.09 Mil/uL (ref 3.87–5.11)
RDW: 16.1 % — ABNORMAL HIGH (ref 11.5–15.5)
WBC: 9.4 10*3/uL (ref 4.0–10.5)

## 2015-11-24 LAB — MICROALBUMIN / CREATININE URINE RATIO
Creatinine,U: 142.8 mg/dL
Microalb Creat Ratio: 0.7 mg/g (ref 0.0–30.0)
Microalb, Ur: 1 mg/dL (ref 0.0–1.9)

## 2015-11-24 LAB — LIPID PANEL
Cholesterol: 167 mg/dL (ref 0–200)
HDL: 43.4 mg/dL (ref >39.00)
LDL Cholesterol: 102 mg/dL — ABNORMAL HIGH (ref 0–99)
NonHDL: 123.2
Total CHOL/HDL Ratio: 4
Triglycerides: 104 mg/dL (ref 0.0–149.0)
VLDL: 20.8 mg/dL (ref 0.0–40.0)

## 2015-11-28 ENCOUNTER — Ambulatory Visit: Payer: Medicare Other

## 2015-11-29 ENCOUNTER — Ambulatory Visit: Payer: Medicare Other | Admitting: Internal Medicine

## 2015-11-29 ENCOUNTER — Telehealth: Payer: Self-pay | Admitting: Internal Medicine

## 2015-11-29 DIAGNOSIS — Z0289 Encounter for other administrative examinations: Secondary | ICD-10-CM

## 2015-11-29 NOTE — Telephone Encounter (Signed)
Pt called stating she will not be able to make her appt today she had a family emergency and would not make it in time by 4:10pm. Pt also states her appt needs to be a hour appt and it takes 30 min just to go over her meds. Pt did resch her appt to 07/05. Thank you!

## 2015-11-29 NOTE — Telephone Encounter (Signed)
FYI

## 2015-11-30 ENCOUNTER — Ambulatory Visit
Admission: RE | Admit: 2015-11-30 | Discharge: 2015-11-30 | Disposition: A | Discharge status: Home / Self Care | Payer: Medicare Other | Source: Ambulatory Visit | Attending: Internal Medicine | Admitting: Internal Medicine

## 2015-11-30 ENCOUNTER — Telehealth: Payer: Self-pay

## 2015-11-30 DIAGNOSIS — D513 Other dietary vitamin B12 deficiency anemia: Secondary | ICD-10-CM

## 2015-11-30 DIAGNOSIS — Z13820 Encounter for screening for osteoporosis: Secondary | ICD-10-CM | POA: Diagnosis not present

## 2015-11-30 DIAGNOSIS — E2839 Other primary ovarian failure: Secondary | ICD-10-CM | POA: Diagnosis not present

## 2015-11-30 DIAGNOSIS — E1142 Type 2 diabetes mellitus with diabetic polyneuropathy: Secondary | ICD-10-CM

## 2015-11-30 DIAGNOSIS — D500 Iron deficiency anemia secondary to blood loss (chronic): Secondary | ICD-10-CM

## 2015-11-30 DIAGNOSIS — M8580 Other specified disorders of bone density and structure, unspecified site: Secondary | ICD-10-CM | POA: Diagnosis not present

## 2015-11-30 DIAGNOSIS — M85852 Other specified disorders of bone density and structure, left thigh: Secondary | ICD-10-CM | POA: Diagnosis not present

## 2015-11-30 DIAGNOSIS — Z780 Asymptomatic menopausal state: Secondary | ICD-10-CM | POA: Diagnosis not present

## 2015-11-30 NOTE — Telephone Encounter (Signed)
Pt called back and stated that if you want to fax her lab results you can. If you do she said to call her first before you fax. Thank you!

## 2015-11-30 NOTE — Telephone Encounter (Signed)
Patient walked into the office requesting lab results.  She is concerned about her hemoglobin level.  Patient is unable to take oral iron, she would have to do IV iron due to chemo.  I went to give patient a copy of labs but she left before I could do so, will mail her a copy.  Please advise.

## 2015-11-30 NOTE — Telephone Encounter (Signed)
She did not have iron studies done, but she is anemic  hgb 10.8.  She did not have an A1c done either, so I will order both . Please give her a  Non fasting lab appointment   Lab Results  Component Value Date   HGB 10.8* 11/24/2015

## 2015-11-30 NOTE — Telephone Encounter (Signed)
Spoke with patient.  Informed her lab results have been mailed and that we would like to do additional testing for iron.  Patient will call back to schedule.

## 2015-11-30 NOTE — Telephone Encounter (Signed)
Unable to reach patient.  Copy of labs mailed.  She also has access to [employer].

## 2015-12-01 ENCOUNTER — Encounter: Payer: Self-pay | Admitting: Internal Medicine

## 2015-12-02 ENCOUNTER — Other Ambulatory Visit: Payer: Medicare Other

## 2015-12-02 ENCOUNTER — Encounter: Payer: Self-pay | Admitting: Internal Medicine

## 2015-12-02 ENCOUNTER — Ambulatory Visit (HOSPITAL_COMMUNITY)
Admission: RE | Admit: 2015-12-02 | Discharge: 2015-12-02 | Disposition: A | Discharge status: Home / Self Care | Payer: Medicare Other | Source: Ambulatory Visit | Attending: Internal Medicine | Admitting: Internal Medicine

## 2015-12-02 ENCOUNTER — Ambulatory Visit (INDEPENDENT_AMBULATORY_CARE_PROVIDER_SITE_OTHER): Payer: Medicare Other | Admitting: Internal Medicine

## 2015-12-02 ENCOUNTER — Other Ambulatory Visit (INDEPENDENT_AMBULATORY_CARE_PROVIDER_SITE_OTHER): Payer: Medicare Other

## 2015-12-02 DIAGNOSIS — M7989 Other specified soft tissue disorders: Secondary | ICD-10-CM

## 2015-12-02 DIAGNOSIS — I824Y3 Acute embolism and thrombosis of unspecified deep veins of proximal lower extremity, bilateral: Secondary | ICD-10-CM | POA: Insufficient documentation

## 2015-12-02 DIAGNOSIS — E119 Type 2 diabetes mellitus without complications: Secondary | ICD-10-CM | POA: Insufficient documentation

## 2015-12-02 DIAGNOSIS — R0602 Shortness of breath: Secondary | ICD-10-CM | POA: Insufficient documentation

## 2015-12-02 DIAGNOSIS — M79604 Pain in right leg: Secondary | ICD-10-CM | POA: Diagnosis not present

## 2015-12-02 DIAGNOSIS — G4710 Hypersomnia, unspecified: Secondary | ICD-10-CM | POA: Diagnosis not present

## 2015-12-02 DIAGNOSIS — G4733 Obstructive sleep apnea (adult) (pediatric): Secondary | ICD-10-CM

## 2015-12-02 DIAGNOSIS — D518 Other vitamin B12 deficiency anemias: Secondary | ICD-10-CM | POA: Diagnosis not present

## 2015-12-02 DIAGNOSIS — I10 Essential (primary) hypertension: Secondary | ICD-10-CM | POA: Diagnosis not present

## 2015-12-02 DIAGNOSIS — M79661 Pain in right lower leg: Secondary | ICD-10-CM | POA: Insufficient documentation

## 2015-12-02 DIAGNOSIS — D500 Iron deficiency anemia secondary to blood loss (chronic): Secondary | ICD-10-CM

## 2015-12-02 DIAGNOSIS — D513 Other dietary vitamin B12 deficiency anemia: Secondary | ICD-10-CM | POA: Diagnosis not present

## 2015-12-02 DIAGNOSIS — E1142 Type 2 diabetes mellitus with diabetic polyneuropathy: Secondary | ICD-10-CM

## 2015-12-02 DIAGNOSIS — Z9989 Dependence on other enabling machines and devices: Secondary | ICD-10-CM

## 2015-12-02 DIAGNOSIS — Z9889 Other specified postprocedural states: Secondary | ICD-10-CM | POA: Diagnosis not present

## 2015-12-02 LAB — FERRITIN: Ferritin: 13.8 ng/mL (ref 10.0–291.0)

## 2015-12-02 LAB — VITAMIN B12: Vitamin B-12: 239 pg/mL (ref 211–911)

## 2015-12-02 LAB — IRON AND TIBC
%SAT: 8 % — ABNORMAL LOW (ref 11–50)
Iron: 28 ug/dL — ABNORMAL LOW (ref 45–160)
TIBC: 337 ug/dL (ref 250–450)
UIBC: 309 ug/dL (ref 125–400)

## 2015-12-02 LAB — HEMOGLOBIN A1C: Hgb A1c MFr Bld: 7.3 % — ABNORMAL HIGH (ref 4.6–6.5)

## 2015-12-02 LAB — D-DIMER, QUANTITATIVE: D-Dimer, Quant: 1.6 ug/mL-FEU — ABNORMAL HIGH (ref 0.00–0.48)

## 2015-12-02 NOTE — Progress Notes (Signed)
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 Will need new DME-not [employer] at patient request.  Followed by Dr Melvyn Novas for Pulmonary- DOE, LOV 12/06/14- Dr Gustavus Bryant note reviewed: Referred back by Dr [name]. Pt c/o "spells of passing out or falling asleep easily" since Jan 2016. She states she stays tired all of the time and only sleeps about 4 hours every night. Falls asleep while sitting and wathcing tv, riding in a car.    Most recent cpap 08/2012 titration > Set at 7 because had lost wt but gained back 50 lb and Does not know why that happened, newly on thyroid x sev months and having up to 25 bm's a day but still gaining wt - records show wt 50 in October then started problems late Dec2015 / Early Jan 2016 feeling a lot more sleepy also with periods of decreased responsiveness and had one spell where didn't come around and admitted to [facility] with cardiac w/u neg in Jan and Neurology w/u since then also inconclusive  Does not sleep well at hs with multiple drugs benzo's / has not tried belsomra      NPSG 08/14/12 at [facility]/ Sleep Med   Mild OSA, AHI 13.2/ hr, weight 218 lbs   CPAP 6 Meds include lunesta 2 mg, Lorcet-HD, Requip Husband here elaborate on wife's observations. She worked for many years rotating shift and says when she was younger she had no difficulty staying awake during the daytime. Diagnosed with obstructive sleep apnea many years ago and wearing CPAP for many years. Recent sleep study confirmed diagnosis for continuation of CPAP. She is wanting to switch from current DME. ENT surgery-tonsillectomy as a child. Current problem-over the last year she has found it much harder to fall asleep and stay asleep in his had much more problem with sleepiness during the daytime. Bedtime around midnight but often awake for 2 hours. Wakes 2 or 3 times before up some where between 9:30 and 10:30 AM. Has been taking Lunesta plus Valium most nights,  plus Lyrica 150 mg at bedtime and 75 mg daytime, and tramadol. Describes "passing out" episodes for which she has been hospitalized and has had a neurologic workup by Dr. Jannifer Franklin. Husband has witnessed these and says it looks as if she falls asleep. She drifts off very easily whenever sitting quietly but is easy to arouse. Denies cataplexy or sleep paralysis. Vivid dreams are not noted during naps. Thyroid disorders treated. Little caffeine. She has several opioid pain medicines for chronic back and joint pains, takes Benadryl occasionally if needed for seasonal rhinitis symptoms. 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 Melvyn Novas for pulmonary FOLLOWS FOR: review CPAP titration and MSLT results with patient. Pt has concerns with how MSLT test was done. NPSG 05/22/15- CPAP Titration study- Optimal 7 cwp,  MSLT 05/23/15 ( she had taken Lyrica the night before during CPAP titration against protocol). 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 She describes chronic sleep fragmentation by very frequent bathroom trips because of bowel urgency "25 times a day". She continues CPAP, already at 7 Most nights she uses Lunesta 1 mg plus Valium 5 mg plus Lyrica 75 mg plus Robinul for leg cramps. She says she sleeps much better away from home  and blames a bad mattress in her own bedroom. She and her husband are headed from here to a mattress store.   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 Melvyn Novas for pulmonary CPAP 6/  Valium, Lunesta, Zanaflex, Requip FOLLOWS FOR: DME: Pt needs new local company-does not have one(hard to get someone to take [facility]). DL attached.   Pt is having increased weight-19# in 2-3 weeks and extreme  SOB with Right leg pain. Pt was not able to be seen for physical due to being slightly late for appt-next appt is July 2017. Recent surgery related to recurrent umbilical hernia Still feels tired. Download shows 73% compliance with use at least 4 hours a night. Residual AHI is not reported. GI Dr. deferred her trial of Provigil because of her diarrhea. She is told she does not snore with CPAP mask in place. Does snore without it. Complains of fluid retention in the last 2 weeks with some dyspnea on exertion she associates with weight gain. Calves swelling, right more than left. Some tenderness especially in left lower leg pretibially. No history DVT. She has appointment pending with PCP  ROS-see HPI   Negative unless "+" Constitutional:    weight loss, night sweats, fevers, chills,+ fatigue, lassitude. HEENT:   + headaches, +ifficulty swallowing, tooth/dental problems, sore throat,       sneezing, itching, ear ache, +nasal congestion, post nasal drip, snoring CV:    chest pain, orthopnea, PND, + swelling in lower extremities, anasarca,                                                    dizziness, palpitations Resp:  +shortness of breath with exertion or at rest.                productive cough,   non-productive cough, coughing up of blood.              change in color of mucus.  wheezing.   Skin:    rash or lesions. GI:  No-   heartburn, indigestion, abdominal pain, nausea, vomiting, diarrhea,                 change in bowel habits, loss of appetite GU: dysuria, change in color of urine, no urgency or frequency.   flank pain.  MS:   +joint pain, stiffness, decreased range of motion, back pain. Neuro-     nothing unusual Psych:  change in mood or affect.  depression or anxiety.   memory loss.  OBJ- Physical Exam General- Alert, Oriented, Affect-appropriate, Distress- none acute, +obese,+ talkative and brisk. Skin- rash-none, lesions- none, excoriation- none Lymphadenopathy- none Head-  atraumatic            Eyes- Gross vision intact, PERRLA, conjunctivae and secretions clear            Ears- Hearing, canals-normal            Nose- Clear, no-Septal dev, mucus, polyps, erosion, perforation             Throat- Mallampati III , mucosa clear , drainage- none, tonsils- atrophic, + dentures Neck- flexible , trachea midline, no stridor , thyroid nl, carotid no bruit Chest - symmetrical excursion , unlabored           Heart/CV-  RRR , no murmur , no gallop  , no rub, nl s1 s2                           - JVD- none , edema +R>L, stasis changes- none, varices- none           Lung- clear to P&A, wheeze- none, cough- none , dullness-none, rub- none, rales-none, +O2 sat 98% RA           Chest wall-  Abd-  Br/ Gen/ Rectal- Not done, not indicated Extrem- cyanosis- none, clubbing, none, atrophy- none, strength- nl, Homans +R Neuro- grossly intact to observation

## 2015-12-02 NOTE — Assessment & Plan Note (Addendum)
Compliance with CPAP is only adequate and download does not give [place] a residual AHI just adequacy of control. Daytime sleepiness reflects sleep disturbance also by recurrent need for bathroom at night and potential carryover from sedating medicines taken at bedtime as noted previously. Plan-educated on importance of full compliance with CPAP. We will help her establish with a DME that takes her insurance.

## 2015-12-02 NOTE — Assessment & Plan Note (Signed)
She describes increasing swelling and tightness/soreness right lower leg for the past 2 weeks. She blames fluid retention and has returned scheduled with her primary physician, lab work pending. No history of DVT. D-dimer may be elevated after recent surgery for umbilical hernia repair, I recommended we get d-dimer and Doppler her leg veins to exclude DVT.

## 2015-12-02 NOTE — Patient Instructions (Signed)
We can continue CPAP 6 and we will try to help you get settled with a DME company  Our goal is to use CPAP any time you are sleeping  Order- lab-  D-dimer       Dx DVT                       Schedule Doppler bilateral leg veins  Keep appointments to follow up with Dr Suann Larry your legs when sitting.

## 2015-12-02 NOTE — Assessment & Plan Note (Signed)
She does not have cataplexy. I favor her complaint of daytime sleepiness to reflect continuing fragmented sleep and use of sedating medications.

## 2015-12-03 ENCOUNTER — Encounter: Payer: Self-pay | Admitting: Internal Medicine

## 2015-12-05 ENCOUNTER — Other Ambulatory Visit: Payer: Self-pay | Admitting: Internal Medicine

## 2015-12-05 MED ORDER — FUROSEMIDE 20 MG PO TABS: 20.0000 mg | ORAL_TABLET | Freq: Every day | ORAL | Status: DC

## 2015-12-05 NOTE — Telephone Encounter (Signed)
Patient saw Pulmonology on 12/02/15 had [place] for possible DVT ( Negative for  DVT)  patient complaint of swollen legs and feet   Have notified patientt I cn schedule for one of the other providers for this problem, patietn would like to know if you would like her to take a fluid pill? Patient does not  Have one on Med list.

## 2015-12-06 LAB — FOLATE RBC

## 2015-12-07 ENCOUNTER — Other Ambulatory Visit: Payer: Self-pay | Admitting: Internal Medicine

## 2015-12-07 ENCOUNTER — Telehealth: Payer: Self-pay | Admitting: Internal Medicine

## 2015-12-07 ENCOUNTER — Ambulatory Visit
Admission: RE | Admit: 2015-12-07 | Discharge: 2015-12-07 | Disposition: A | Discharge status: Home / Self Care | Payer: Medicare Other | Source: Ambulatory Visit | Attending: Internal Medicine | Admitting: Internal Medicine

## 2015-12-07 ENCOUNTER — Other Ambulatory Visit
Admission: RE | Admit: 2015-12-07 | Discharge: 2015-12-07 | Disposition: A | Discharge status: Home / Self Care | Payer: Medicare Other | Source: Ambulatory Visit | Attending: Internal Medicine | Admitting: Internal Medicine

## 2015-12-07 DIAGNOSIS — R0789 Other chest pain: Secondary | ICD-10-CM | POA: Diagnosis not present

## 2015-12-07 DIAGNOSIS — R918 Other nonspecific abnormal finding of lung field: Secondary | ICD-10-CM | POA: Diagnosis not present

## 2015-12-07 DIAGNOSIS — I2609 Other pulmonary embolism with acute cor pulmonale: Secondary | ICD-10-CM

## 2015-12-07 LAB — BASIC METABOLIC PANEL
Anion gap: 12 (ref 5–15)
BUN: 13 mg/dL (ref 6–20)
CO2: 23 mmol/L (ref 22–32)
Calcium: 9 mg/dL (ref 8.9–10.3)
Chloride: 106 mmol/L (ref 101–111)
Creatinine, Ser: 1.03 mg/dL — ABNORMAL HIGH (ref 0.44–1.00)
GFR calc Af Amer: >60 mL/min (ref >60)
GFR calc non Af Amer: 54 mL/min — ABNORMAL LOW (ref >60)
Glucose, Bld: 140 mg/dL — ABNORMAL HIGH (ref 65–99)
Potassium: 3.9 mmol/L (ref 3.5–5.1)
Sodium: 141 mmol/L (ref 135–145)

## 2015-12-07 MED ORDER — IOPAMIDOL (ISOVUE-370) INJECTION 76%
100.0000 mL | Freq: Once | INTRAVENOUS | Status: AC | PRN
Start: 2015-12-07 — End: 2015-12-07
  Administered 2015-12-07: 100 mL via INTRAVENOUS

## 2015-12-07 NOTE — Telephone Encounter (Signed)
Pt calling for CT results. States that she just got done and they told her it was clear and that our office would be calling her with the rest of the results. Pt requesting results today. Aware that I will send to Dr Annamaria Boots to advise. CTA results are in EPIC, please advise CY. Thanks.

## 2015-12-07 NOTE — Telephone Encounter (Signed)
Left a message to call office to schedule appt .

## 2015-12-07 NOTE — Telephone Encounter (Signed)
The patient has a scheduled appointment with [name] on 5.30.17 at 4:30 ok per [name].

## 2015-12-07 NOTE — Telephone Encounter (Signed)
Can I use 11/21/15 at 11.30?

## 2015-12-07 NOTE — Telephone Encounter (Signed)
Pt call pt w/result of test she had done today.Denise Sharp

## 2015-12-07 NOTE — Telephone Encounter (Signed)
[phone], pt cb

## 2015-12-07 NOTE — Telephone Encounter (Signed)
atc pt's spouse back, line rang X2 then cut out.  Wcb.

## 2015-12-08 ENCOUNTER — Encounter: Payer: Self-pay | Admitting: Internal Medicine

## 2015-12-08 NOTE — Telephone Encounter (Signed)
Result Notes     Notes Recorded by Deneise Lever, MD on 12/07/2015 at 3:03 PM CT chest- Negative for pulmonary embolism. Sorry for the extra testing, but better safe than sorry. There were 2 small lung nodules in the upper left lung, whch have not changed since 2014. These are probably benign after this length of time.  ------------------------------------------------------------ Notes Recorded by Deneise Lever, MD on 12/07/2015 at 3:00 PM Lab ok except glucose elevated at 140. -------------------------------------------------------- lmtcb x1 for pt.

## 2015-12-09 NOTE — Telephone Encounter (Signed)
Pt is aware of results. Nothing further was needed. 

## 2015-12-13 ENCOUNTER — Encounter: Payer: Self-pay | Admitting: Internal Medicine

## 2015-12-13 ENCOUNTER — Ambulatory Visit (INDEPENDENT_AMBULATORY_CARE_PROVIDER_SITE_OTHER): Payer: Medicare Other | Admitting: Internal Medicine

## 2015-12-13 VITALS — BP 132/76 | HR 64 | Temp 98.4°F | Resp 14 | Ht 65.0 in | Wt 235.2 lb

## 2015-12-13 DIAGNOSIS — D519 Vitamin B12 deficiency anemia, unspecified: Secondary | ICD-10-CM | POA: Diagnosis not present

## 2015-12-13 DIAGNOSIS — E1142 Type 2 diabetes mellitus with diabetic polyneuropathy: Secondary | ICD-10-CM | POA: Diagnosis not present

## 2015-12-13 DIAGNOSIS — I10 Essential (primary) hypertension: Secondary | ICD-10-CM

## 2015-12-13 DIAGNOSIS — R601 Generalized edema: Secondary | ICD-10-CM

## 2015-12-13 DIAGNOSIS — D509 Iron deficiency anemia, unspecified: Secondary | ICD-10-CM

## 2015-12-13 DIAGNOSIS — R609 Edema, unspecified: Secondary | ICD-10-CM | POA: Insufficient documentation

## 2015-12-13 MED ORDER — CYANOCOBALAMIN 1000 MCG/ML IJ SOLN: INTRAMUSCULAR | Status: DC

## 2015-12-13 MED ORDER — MOMETASONE FUROATE 0.1 % EX CREA: 1.0000 "application " | TOPICAL_CREAM | Freq: Every day | CUTANEOUS | Status: DC

## 2015-12-13 NOTE — Progress Notes (Signed)
Subjective:  Patient ID: Denise Sharp, female    DOB: 24-Feb-1946  Age: 70 y.o. MRN: [id number]  CC: The primary encounter diagnosis was Generalized edema. 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  HPI Denise Sharp presents for FOLLOW UP ON MULTIPLE ISSUES  LE edema.  Has been  HAVING TREMENDOUS LEG  SWELLING that has been making it hard for her to walkd and bear weeigh on her feet.  Has seen podiatry,  No fractures  , has seen pulmonary, with rule out out DVT and PE .  Has been unable to stand on feet due to  pain.  Better today because she was off her feeet yesterday  Has not had venous insufficiency  Ruled out   Last sleep study was over 6 months ago.  Dr Denise Sharp managing OSA Had nuc med study  In January .  Normal .  EF 81%   Took furosemide twice daily for 2 days     Had hernia surgery in January  By Dr Denise Sharp Denise Sharp hernia) . Required a sleeve  And mesh done at [facility].    Back pain:  [place] off her scooter 2 weeks ago when reaching down .  Back looks ol  Diabetes reviewed, loss of coontrol with rise in a1c to 7.3 she reports indulging in  sweet tea.  B12 TO [facility]   IRON STORES ARE LOW.  CAN'T TAKE IRON .  GET IRON [facility] UP [facility]        Outpatient Prescriptions Prior to Visit  Medication Sig Dispense Refill  . alosetron (LOTRONEX) 1 MG tablet Take 1 tablet (1 mg total) by mouth 2 (two) times daily. 60 tablet 11  . benzonatate (TESSALON) 200 MG capsule Take 1 capsule (200 mg total) by mouth 3 (three) times daily as needed for cough. 180 capsule 1  . Cholecalciferol (VITAMIN D3) 10000 UNITS capsule Take 10,000 Units by mouth once a week.    . diazepam (VALIUM) 10 MG tablet Take 1 tablet (10 mg total) by mouth at bedtime as needed for anxiety. 90 tablet 1  . diphenoxylate-atropine  (LOMOTIL) 2.5-0.025 MG per tablet Take 1-2 tablets by mouth 4 (four) times daily as needed for diarrhea or loose stools. 240 tablet 2  . Glucose Blood (BAYER BREEZE 2 TEST) DISK Use one test strip each time glucose needs testing. 50 each 2  . hydrocodone-acetaminophen (LORCET-HD) 5-500 MG per capsule Take 1 capsule by mouth every 6 (six) hours as needed. 30 capsule 3  . loperamide (IMODIUM A-D) 2 MG tablet Take by mouth.    . nebivolol (BYSTOLIC) 5 MG tablet Take 1 tablet (5 mg total) by mouth daily. 90 tablet 1  . nystatin (MYCOSTATIN) powder Apply topically 2 (two) times daily. 60 g 3  . pregabalin (LYRICA) 100 MG capsule One capsule in the morning and two in the evening 270 capsule 2  . rOPINIRole (REQUIP) 1 MG tablet Take 1 tablet (1 mg total) by mouth at bedtime. 90 tablet 2  . sitaGLIPtin (JANUVIA) 100 MG tablet Take 1 tablet (100 mg total) by mouth daily. 90 tablet 2  . tiZANidine (ZANAFLEX) 2 MG tablet Take by mouth 3 (three) times daily.    . traMADol (ULTRAM) 50 MG tablet Take 50 mg by mouth 4 (four) times daily as needed.    . Blood  Pressure Monitoring (BLOOD PRESSURE MONITOR/ARM) DEVI Automatic BP machine.  Normal adult size arm (Patient not taking: Reported on 12/13/2015) 1 Device 0  . Eluxadoline 100 MG TABS Take by mouth. Reported on 12/13/2015    . ergocalciferol (DRISDOL) 50000 units capsule Take by mouth. Reported on 12/13/2015    . furosemide (LASIX) 20 MG tablet Take 1 tablet (20 mg total) by mouth daily. (Patient not taking: Reported on 12/13/2015) 30 tablet 3  . albuterol (PROVENTIL HFA;VENTOLIN HFA) 108 (90 BASE) MCG/ACT inhaler Inhale 2 puffs into the lungs every 6 (six) hours as needed for wheezing or shortness of breath. (Patient not taking: Reported on 12/13/2015) 2 Inhaler 3  . cyanocobalamin (,VITAMIN B-12,) 1000 MCG/ML injection 1000 ml (1 ml) injection twice monthly (Patient not taking: Reported on 12/13/2015) 25 mL 3  . estradiol (ESTRACE) 0.5 MG tablet Take 1 tablet (0.5  mg total) by mouth daily. (Patient not taking: Reported on 12/13/2015) 90 tablet 2  . hydrALAZINE (APRESOLINE) 25 MG tablet Take 1 tablet (25 mg total) by mouth 3 (three) times daily. As needed for bp > 160 (Patient not taking: Reported on 12/13/2015) 270 tablet 1  . oxyCODONE-acetaminophen (PERCOCET/ROXICET) 5-325 MG per tablet Take 1 tablet by mouth every 6 (six) hours as needed. (Patient not taking: Reported on 12/13/2015) 30 tablet 0   No facility-administered medications prior to visit.    Review of Systems;  Patient denies headache, fevers, malaise, unintentional weight loss, skin rash, eye pain, sinus congestion and sinus pain, sore throat, dysphagia,  hemoptysis , cough, dyspnea, wheezing, chest pain, palpitations, orthopnea, edema, abdominal pain, nausea, melena, diarrhea, constipation, flank pain, dysuria, hematuria, urinary  Frequency, nocturia, numbness, tingling, seizures,  Focal weakness, Loss of consciousness,  Tremor, insomnia, depression, anxiety, and suicidal ideation.      Objective:  BP 132/76 mmHg  Pulse 64  Temp(Src) 98.4 F (36.9 C) (Oral)  Resp 14  Ht 5\' 5"  (1.651 m)  Wt 235 lb 4 oz (106.709 kg)  BMI 39.15 kg/m2  SpO2 98%  BP Readings from Last 3 Encounters:  12/13/15 132/76  12/02/15 124/70  11/15/15 112/65    Wt Readings from Last 3 Encounters:  12/13/15 235 lb 4 oz (106.709 kg)  12/02/15 240 lb (108.863 kg)  11/03/15 234 lb 12.8 oz (106.505 kg)    General appearance: alert, cooperative and appears stated age Ears: normal TM's and external ear canals both ears Throat: lips, mucosa, and tongue normal; teeth and gums normal Neck: no adenopathy, no carotid bruit, supple, symmetrical, trachea midline and thyroid not enlarged, symmetric, no tenderness/mass/nodules Back: symmetric, no curvature. ROM normal. No CVA tenderness. Lungs: clear to auscultation bilaterally Heart: regular rate and rhythm, S1, S2 normal, no murmur, click, rub or gallop Abdomen:  soft, non-tender; bowel sounds normal; no masses,  no organomegaly Pulses: 2+ and symmetric Skin: Skin color, texture, turgor normal. No rashes or lesions Lymph nodes: Cervical, supraclavicular, and axillary nodes normal.  Lab Results  Component Value Date   HGBA1C 7.3* 12/02/2015   HGBA1C 7.1* 06/16/2015   HGBA1C 6.8* 08/13/2014    Lab Results  Component Value Date   CREATININE 1.03* 12/07/2015   CREATININE 0.97 11/24/2015   CREATININE 1.00 06/16/2015    Lab Results  Component Value Date   WBC 9.4 11/24/2015   HGB 10.8* 11/24/2015   HCT 33.5* 11/24/2015   PLT 296.0 11/24/2015   GLUCOSE 140* 12/07/2015   CHOL 167 11/24/2015   TRIG 104.0 11/24/2015   HDL 43.40 11/24/2015  LDLDIRECT 111.0 06/16/2015   LDLCALC 102* 11/24/2015   ALT 11 11/24/2015   AST 14 11/24/2015   NA 141 12/07/2015   K 3.9 12/07/2015   CL 106 12/07/2015   CREATININE 1.03* 12/07/2015   BUN 13 12/07/2015   CO2 23 12/07/2015   TSH 4.400 06/16/2015   HGBA1C 7.3* 12/02/2015   MICROALBUR 1.0 11/24/2015    Ct Angio Chest W/cm &/or Wo Cm  12/07/2015  CLINICAL DATA:  Pt c/o bilateral lower extremity swelling with mediastinal chest pain x 2-3 weeks. NKI. EXAM: CT ANGIOGRAPHY CHEST WITH CONTRAST TECHNIQUE: Multidetector CT imaging of the chest was performed using the standard protocol during bolus administration of intravenous contrast. Multiplanar CT image reconstructions and MIPs were obtained to evaluate the vascular anatomy. CONTRAST:  100 mL Isovue 370 COMPARISON:  04/05/2015, 02/27/2013 FINDINGS: There is adequate opacification of the pulmonary arteries. There is no pulmonary embolus. The main pulmonary artery, right main pulmonary artery and left main pulmonary arteries are normal in size. The heart size is normal. There is no pericardial effusion. 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 10 x 8 mm ground-glass left upper lobe pulmonary nodule (image 22/ series 6). At 9 x 8 mm ground-glass  left upper lobe pulmonary nodule (image 22/ series 6). There is no focal consolidation, pleural effusion or pneumothorax. There is no axillary, hilar, or mediastinal adenopathy. There is no lytic or blastic osseous lesion. The visualized portions of the upper abdomen are unremarkable. 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 Review of the MIP images confirms the above findings. IMPRESSION: 1. No evidence of pulmonary embolus. 2. No acute cardiopulmonary disease. 3. Two stable ground-glass left upper lobe pulmonary nodules unchanged compared with 02/27/2013. Electronically Signed   By: Kathreen Devoid   On: 12/07/2015 13:58    Assessment & Plan:   Problem List Items Addressed This Visit    Hypertension    Well controlled on current regimen. Renal function stable, no changes today.  Lab Results  Component Value Date   CREATININE 1.03* 12/07/2015   Lab Results  Component Value Date   NA 141 12/07/2015   K 3.9 12/07/2015   CL 106 12/07/2015   CO2 23 12/07/2015         DM type 2 with diabetic peripheral neuropathy (H[facility])    Loss of control addressed with dietary changes  Lab Results  Component Value Date   HGBA1C 7.3* 12/02/2015   Lab Results  Component Value Date   MICROALBUR 1.0 11/24/2015         Anemia    Multifactorial, b12 deficient again due to gastric bypass and lapse in supplementation .  Resume B12 supplementation       Relevant Medications   cyanocobalamin (,VITAMIN B-12,) 1000 MCG/ML injection   Iron deficiency anemia    Persistent secondary to gastric bypass an drecent surgery.  Intolerant of orla iron.  Referring to [place] to iron infusion       Relevant Medications   cyanocobalamin (,VITAMIN B-12,) 1000 MCG/ML injection   Edema - Primary    Workup thus far has ruled out DVT, PE and fracture.  Normal EF by myoview two months ago. Venous insufficiency suspected,  Ultrasound ordered.       Relevant Orders   Ambulatory referral to Vascular Surgery      Other Visit Diagnoses    Anemia, iron deficiency        Relevant Medications    cyanocobalamin (,VITAMIN B-12,) 1000 MCG/ML injection  Other Relevant Orders    Ambulatory referral to Hematology       I have discontinued Ms. Gornick's oxyCODONE-acetaminophen, estradiol, albuterol, and hydrALAZINE. I have also changed her cyanocobalamin. Additionally, I am having her start on mometasone. Lastly, I am having her maintain her hydrocodone-acetaminophen, traMADol, Vitamin D3, benzonatate, nystatin, diazepam, diphenoxylate-atropine, sitaGLIPtin, Blood Pressure Monitor/Arm, loperamide, ergocalciferol, Eluxadoline, pregabalin, alosetron, tiZANidine, Glucose Blood, nebivolol, rOPINIRole, and furosemide.  Meds ordered this encounter  Medications  . mometasone (ELOCON) 0.1 % cream    Sig: Apply 1 application topically daily.    Dispense:  45 g    Refill:  0  . cyanocobalamin (,VITAMIN B-12,) 1000 MCG/ML injection    Sig: 1000 ml (1 ml) injection two times  monthly    Dispense:  10 mL    Refill:  3    Medications Discontinued During This Encounter  Medication Reason  . cyanocobalamin (,VITAMIN B-12,) 1000 MCG/ML injection Reorder  . albuterol (PROVENTIL HFA;VENTOLIN HFA) 108 (90 BASE) MCG/ACT inhaler   . estradiol (ESTRACE) 0.5 MG tablet   . hydrALAZINE (APRESOLINE) 25 MG tablet   . oxyCODONE-acetaminophen (PERCOCET/ROXICET) 5-325 MG per tablet     Follow-up: No Follow-up on file.   [name], MD

## 2015-12-13 NOTE — Patient Instructions (Addendum)
   I have scheduled  You for an ultrasound of your legs to determine if you have "venous insufficiency."  This is a very common problems that is managed with compression stockings,  Walking daily,  And leg elevation when able   I have prescribed mometasone ointment for your itchy ear .  You can use it once daily    If [facility] wll not pay for your blood pressure monitor,  Medicare may not either.  My staff doe not have an address for you   Resume your B12 injections WEEKLY FOR 3 WEEKS,  THEM MONTHLY    Cut out the sweet tea    We will set up  iron infusion at the cancer center

## 2015-12-13 NOTE — Progress Notes (Signed)
Pre-visit discussion using our clinic review tool. No additional management support is needed unless otherwise documented below in the visit note.  

## 2015-12-14 NOTE — Assessment & Plan Note (Addendum)
Workup thus far has ruled out DVT, PE and fracture.  Normal EF by myoview two months ago. Venous insufficiency suspected,  Ultrasound ordered.

## 2015-12-14 NOTE — Assessment & Plan Note (Signed)
Persistent secondary to gastric bypass an drecent surgery.  Intolerant of orla iron.  Referring to cancer Center to iron infusion

## 2015-12-14 NOTE — Assessment & Plan Note (Signed)
Multifactorial, b12 deficient again due to gastric bypass and lapse in supplementation .  Resume B12 supplementation

## 2015-12-14 NOTE — Assessment & Plan Note (Signed)
Loss of control addressed with dietary changes  Lab Results  Component Value Date   HGBA1C 7.3* 12/02/2015   Lab Results  Component Value Date   MICROALBUR 1.0 11/24/2015

## 2015-12-14 NOTE — Assessment & Plan Note (Signed)
Well controlled on current regimen. Renal function stable, no changes today.  Lab Results  Component Value Date   CREATININE 1.03* 12/07/2015   Lab Results  Component Value Date   NA 141 12/07/2015   K 3.9 12/07/2015   CL 106 12/07/2015   CO2 23 12/07/2015

## 2015-12-22 ENCOUNTER — Inpatient Hospital Stay: Payer: Medicare Other | Attending: Hematology and Oncology | Admitting: Oncology

## 2015-12-22 VITALS — BP 132/69 | HR 49 | Resp 18

## 2015-12-22 DIAGNOSIS — I10 Essential (primary) hypertension: Secondary | ICD-10-CM | POA: Insufficient documentation

## 2015-12-22 DIAGNOSIS — M129 Arthropathy, unspecified: Secondary | ICD-10-CM | POA: Diagnosis not present

## 2015-12-22 DIAGNOSIS — D509 Iron deficiency anemia, unspecified: Secondary | ICD-10-CM | POA: Diagnosis not present

## 2015-12-22 DIAGNOSIS — E1142 Type 2 diabetes mellitus with diabetic polyneuropathy: Secondary | ICD-10-CM | POA: Diagnosis not present

## 2015-12-22 DIAGNOSIS — M4802 Spinal stenosis, cervical region: Secondary | ICD-10-CM | POA: Diagnosis not present

## 2015-12-22 DIAGNOSIS — Z8042 Family history of malignant neoplasm of prostate: Secondary | ICD-10-CM | POA: Diagnosis not present

## 2015-12-22 DIAGNOSIS — E669 Obesity, unspecified: Secondary | ICD-10-CM | POA: Insufficient documentation

## 2015-12-22 DIAGNOSIS — R531 Weakness: Secondary | ICD-10-CM | POA: Insufficient documentation

## 2015-12-22 DIAGNOSIS — G2581 Restless legs syndrome: Secondary | ICD-10-CM | POA: Insufficient documentation

## 2015-12-22 DIAGNOSIS — G4730 Sleep apnea, unspecified: Secondary | ICD-10-CM | POA: Insufficient documentation

## 2015-12-22 DIAGNOSIS — R918 Other nonspecific abnormal finding of lung field: Secondary | ICD-10-CM | POA: Diagnosis not present

## 2015-12-22 DIAGNOSIS — F329 Major depressive disorder, single episode, unspecified: Secondary | ICD-10-CM | POA: Diagnosis not present

## 2015-12-22 DIAGNOSIS — I447 Left bundle-branch block, unspecified: Secondary | ICD-10-CM | POA: Insufficient documentation

## 2015-12-22 DIAGNOSIS — R5383 Other fatigue: Secondary | ICD-10-CM | POA: Diagnosis not present

## 2015-12-22 DIAGNOSIS — R42 Dizziness and giddiness: Secondary | ICD-10-CM | POA: Insufficient documentation

## 2015-12-22 DIAGNOSIS — Z79899 Other long term (current) drug therapy: Secondary | ICD-10-CM | POA: Diagnosis not present

## 2015-12-22 DIAGNOSIS — K589 Irritable bowel syndrome without diarrhea: Secondary | ICD-10-CM | POA: Insufficient documentation

## 2015-12-22 DIAGNOSIS — R1900 Intra-abdominal and pelvic swelling, mass and lump, unspecified site: Secondary | ICD-10-CM | POA: Diagnosis not present

## 2015-12-22 DIAGNOSIS — Z9884 Bariatric surgery status: Secondary | ICD-10-CM | POA: Insufficient documentation

## 2015-12-22 NOTE — Progress Notes (Signed)
Patient not seen in this office for iron infusions since 2014.

## 2015-12-23 ENCOUNTER — Other Ambulatory Visit: Payer: Self-pay | Admitting: Oncology

## 2015-12-23 ENCOUNTER — Inpatient Hospital Stay: Payer: Medicare Other

## 2015-12-23 ENCOUNTER — Other Ambulatory Visit: Payer: Self-pay | Admitting: Internal Medicine

## 2015-12-23 VITALS — BP 119/72 | HR 66 | Resp 20

## 2015-12-23 DIAGNOSIS — D509 Iron deficiency anemia, unspecified: Secondary | ICD-10-CM | POA: Diagnosis not present

## 2015-12-23 DIAGNOSIS — Z79899 Other long term (current) drug therapy: Secondary | ICD-10-CM | POA: Diagnosis not present

## 2015-12-23 DIAGNOSIS — R531 Weakness: Secondary | ICD-10-CM | POA: Diagnosis not present

## 2015-12-23 DIAGNOSIS — R5383 Other fatigue: Secondary | ICD-10-CM | POA: Diagnosis not present

## 2015-12-23 DIAGNOSIS — R918 Other nonspecific abnormal finding of lung field: Secondary | ICD-10-CM | POA: Diagnosis not present

## 2015-12-23 DIAGNOSIS — F329 Major depressive disorder, single episode, unspecified: Secondary | ICD-10-CM | POA: Diagnosis not present

## 2015-12-23 MED ORDER — LUNESTA 2 MG PO TABS: 2.0000 mg | ORAL_TABLET | Freq: Every evening | ORAL | Status: DC | PRN

## 2015-12-23 MED ORDER — LUNESTA 2 MG PO TABS
2.0000 mg | ORAL_TABLET | Freq: Every evening | ORAL | Status: DC | PRN
Start: 2015-12-23 — End: 2016-02-01

## 2015-12-23 MED ORDER — PREGABALIN 100 MG PO CAPS: ORAL_CAPSULE | ORAL | Status: DC

## 2015-12-23 MED ORDER — FERUMOXYTOL INJECTION 510 MG/17 ML
510.0000 mg | Freq: Once | INTRAVENOUS | Status: AC
  Administered 2015-12-23: 510 mg via INTRAVENOUS
  Filled 2015-12-23: qty 17

## 2015-12-23 MED ORDER — DIAZEPAM 10 MG PO TABS: 10.0000 mg | ORAL_TABLET | Freq: Every evening | ORAL | Status: DC | PRN

## 2015-12-23 MED ORDER — SODIUM CHLORIDE 0.9 % IV SOLN
Freq: Once | INTRAVENOUS | Status: AC
  Administered 2015-12-23: 14:00:00 via INTRAVENOUS
  Filled 2015-12-23: qty 1000

## 2015-12-23 NOTE — Telephone Encounter (Signed)
Pt need refills on the following sent to [facility]  -pregabalin (LYRICA) 100 MG capsule and 50mg  -diazepam (VALIUM) 10 MG tablet -Eszopliclone 2mg   (LUNESTA)

## 2015-12-23 NOTE — Telephone Encounter (Signed)
Last office visit:12/13/15 No future appointments scheduled Okay to refill?

## 2015-12-23 NOTE — Telephone Encounter (Signed)
Refills have been faxed to local and mail order pharmacy.

## 2015-12-23 NOTE — Telephone Encounter (Signed)
Patient will be on vacation for 3 weeks

## 2015-12-23 NOTE — Telephone Encounter (Signed)
Pt needs and extra rx  Due to her being on vacation When it is to be delivered  Sent to [id number] - [place], [place] - [address]  diazepam (VALIUM) 10 MG tablet Eszopliclone

## 2015-12-23 NOTE — Telephone Encounter (Signed)
OK TO PRINT A REFILL FOR THE NUMBER OF DAYS OF EACH THAT SHE NEEDS,  SINCE CONTROLLED SUBSTANCES

## 2015-12-27 ENCOUNTER — Inpatient Hospital Stay: Payer: Medicare Other

## 2015-12-28 NOTE — Telephone Encounter (Signed)
Mailed unread message to patient, thanks 

## 2015-12-29 ENCOUNTER — Telehealth: Payer: Self-pay | Admitting: *Deleted

## 2015-12-29 NOTE — Telephone Encounter (Signed)
Patient stated that she received a call, to come in to have labs drawn. If so she requested to know what the labs are. She can stated the voicemail said she should come in next week, however she can not come because she's out of town. She stated that the message could have been an old message, because her phone does not leave a time and date.  [phone]

## 2015-12-30 NOTE — Telephone Encounter (Signed)
Left a detailed message that we don't have labs for her to be done.  Thanks advised to call if she had any questions.

## 2015-12-30 NOTE — Telephone Encounter (Signed)
Pt called back and I advised pt of msg. Pt verbalized ok thank you!

## 2016-01-01 NOTE — Progress Notes (Signed)
[place]  [phone]) [phone] [phone]) [phone]  ID: Denise Sharp OB: 28-Jul-1945  MR#: [id number]  [id number]  Patient Care Team: [name], MD as PCP - General (Internal Medicine) [facility] as Referring Physician  CHIEF COMPLAINT:  Chief Complaint  Patient presents with  . Anemia    INTERVAL HISTORY: Patient is a 70 year old female whose last evaluated in clinic in April 2014. She is referred back after being found to have a declining hemoglobin and iron stores. She admits to mild weakness and fatigue, but otherwise feels well. She has no neurologic complaints. She denies any recent fevers or illnesses. She has a good appetite and denies weight loss. She denies any chest pain or shortness of breath. She denies any nausea, vomiting, constipation, or diarrhea. She has no melena or hematochezia. She has no urinary complaints. Patient otherwise feels well and offers no further specific complaints today.  REVIEW OF SYSTEMS:   Review of Systems  Constitutional: Positive for malaise/fatigue. Negative for fever and weight loss.  Respiratory: Negative.  Negative for shortness of breath.   Cardiovascular: Negative.  Negative for chest pain.  Gastrointestinal: Negative.  Negative for blood in stool and melena.  Genitourinary: Negative.   Musculoskeletal: Negative.   Neurological: Positive for weakness.  Psychiatric/Behavioral: Negative.     As per HPI. Otherwise, a complete review of systems is negatve.  PAST MEDICAL HISTORY: Past Medical History  Diagnosis Date  . Diabetes mellitus   . Depression   . Hypertension   . Cervical spinal stenosis 1994    due to trauma to back ([employer] accident), has intermittent paralysis and parasthesias  . Adrenal mass ([place])   . IBS (irritable bowel syndrome)   . Obstructive sleep apnea     using CPAP  . Anemia     iron deficient post  2 unit txfsn 2009, normal endo/colonoscopy by [facility]  .  Esophageal stenosis March 2011    with transietn outlet obstruction by food, cleared by EGD   . LBBB (left bundle branch block)   . Obesity   . Apnea   . Headache(784.0)   . Arthritis   . Restless leg syndrome   . Dizziness     chronic dizziness  . Adrenal mass, left ([place])   . 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 Abnormality of gait 03/25/2013  . Syncope and collapse 03/12/2014  . Cervicogenic headache 03/23/2014    PAST SURGICAL HISTORY: Past Surgical History  Procedure Laterality Date  . Gastric bypass    . Abdominal hysterectomy    . Gastric bypass  2000, 2005    Dr. Debroah Loop  . Appendectomy    . Gallbladder surgery  1976  . Rotator cuff repair      right  . Joint replacement  2007    bilateral knee. [name],  [name]  . Spine surgery  1995    [place]  . Interstim implant placement  April 2013    [place]  . Gallbladder surgery  resection  . Total abdominal hysterectomy w/ bilateral salpingoophorectomy  1974  . Umbilical hernia repair  Aug 11, 2015  . Diaphragmatic hernia repair  2015    FAMILY HISTORY Family History  Problem Relation Age of Onset  . Heart disease Father   . Hypertension Father   . Prostate cancer Father   . Stroke Father   . Osteoporosis Father   . Stroke Mother   . Depression Mother   . Headache Mother   . Heart disease Mother   .  Thyroid disease Mother   . Hypertension Mother   . Diabetes Daughter   . Heart disease Daughter   . Hypertension Daughter   . Hypertension Son        ADVANCED DIRECTIVES:    HEALTH MAINTENANCE: Social History  Substance Use Topics  . Smoking status: Never Smoker   . Smokeless tobacco: Never Used  . Alcohol Use: No     Colonoscopy:  PAP:  Bone density:  Lipid panel:  Allergies  Allergen Reactions  . Diovan [Valsartan]   . Erythromycin   . Flagyl [Metronidazole]   . Glucophage [Metformin Hcl]   . Xanax [Alprazolam]   . Amoxicillin Diarrhea  . Demeclocycline Hives  . Erythromycin Base     . Metformin   . Sulfa Antibiotics Rash    As child  . Tetracyclines & Related Hives and Rash    Current Outpatient Prescriptions  Medication Sig Dispense Refill  . alosetron (LOTRONEX) 1 MG tablet Take 1 tablet (1 mg total) by mouth 2 (two) times daily. 60 tablet 11  . benzonatate (TESSALON) 200 MG capsule Take 1 capsule (200 mg total) by mouth 3 (three) times daily as needed for cough. 180 capsule 1  . Cholecalciferol (VITAMIN D3) 10000 UNITS capsule Take 10,000 Units by mouth once a week. Reported on 12/22/2015    . cyanocobalamin (,VITAMIN B-12,) 1000 MCG/ML injection 1000 ml (1 ml) injection two times  monthly 10 mL 3  . Eluxadoline 100 MG TABS Take by mouth. Reported on 12/13/2015    . ergocalciferol (DRISDOL) 50000 units capsule Take by mouth. Reported on 12/13/2015    . hydrocodone-acetaminophen (LORCET-HD) 5-500 MG per capsule Take 1 capsule by mouth every 6 (six) hours as needed. 30 capsule 3  . loperamide (IMODIUM A-D) 2 MG tablet Take by mouth.    . mometasone (ELOCON) 0.1 % cream Apply 1 application topically daily. 45 g 0  . nebivolol (BYSTOLIC) 5 MG tablet Take 1 tablet (5 mg total) by mouth daily. 90 tablet 1  . nystatin (MYCOSTATIN) powder Apply topically 2 (two) times daily. 60 g 3  . rOPINIRole (REQUIP) 1 MG tablet Take 1 tablet (1 mg total) by mouth at bedtime. 90 tablet 2  . sitaGLIPtin (JANUVIA) 100 MG tablet Take 1 tablet (100 mg total) by mouth daily. 90 tablet 2  . traMADol (ULTRAM) 50 MG tablet Take 50 mg by mouth 4 (four) times daily as needed.    . Blood Pressure Monitoring (BLOOD PRESSURE MONITOR/ARM) DEVI Automatic BP machine.  Normal adult size arm (Patient not taking: Reported on 12/13/2015) 1 Device 0  . diazepam (VALIUM) 10 MG tablet Take 1 tablet (10 mg total) by mouth at bedtime as needed for anxiety. 21 tablet 0  . furosemide (LASIX) 20 MG tablet Take 1 tablet (20 mg total) by mouth daily. (Patient not taking: Reported on 12/13/2015) 30 tablet 3  . Glucose  Blood (BAYER BREEZE 2 TEST) DISK Use one test strip each time glucose needs testing. (Patient not taking: Reported on 12/22/2015) 50 each 2  . LUNESTA 2 MG TABS tablet Take 1 tablet (2 mg total) by mouth at bedtime as needed for sleep. Take immediately before bedtime 21 tablet 0  . pregabalin (LYRICA) 100 MG capsule One capsule in the morning and two in the evening 63 capsule 0   No current facility-administered medications for this visit.    OBJECTIVE: Filed Vitals:   12/22/15 1201  BP: 132/69  Pulse: 49  Resp: 18  There is no weight on file to calculate BMI.    ECOG FS:0 - Asymptomatic  General: Well-developed, well-nourished, no acute distress. Eyes: Pink conjunctiva, anicteric sclera. HEENT: Normocephalic, moist mucous membranes, clear oropharnyx. Lungs: Clear to auscultation bilaterally. Heart: Regular rate and rhythm. No rubs, murmurs, or gallops. Abdomen: Soft, nontender, nondistended. No organomegaly noted, normoactive bowel sounds. Musculoskeletal: No edema, cyanosis, or clubbing. Neuro: Alert, answering all questions appropriately. Cranial nerves grossly intact. Skin: No rashes or petechiae noted. Psych: Normal affect. Lymphatics: No cervical, calvicular, axillary or inguinal LAD.   LAB RESULTS:  Lab Results  Component Value Date   NA 141 12/07/2015   K 3.9 12/07/2015   CL 106 12/07/2015   CO2 23 12/07/2015   GLUCOSE 140* 12/07/2015   BUN 13 12/07/2015   CREATININE 1.03* 12/07/2015   CALCIUM 9.0 12/07/2015   PROT 7.0 11/24/2015   ALBUMIN 3.8 11/24/2015   AST 14 11/24/2015   ALT 11 11/24/2015   ALKPHOS 126* 11/24/2015   BILITOT 0.5 11/24/2015   GFRNONAA 54* 12/07/2015   GFRAA >60 12/07/2015    Lab Results  Component Value Date   WBC 9.4 11/24/2015   NEUTROABS 5.8 11/24/2015   HGB 10.8* 11/24/2015   HCT 33.5* 11/24/2015   MCV 81.8 11/24/2015   PLT 296.0 11/24/2015   Lab Results  Component Value Date   IRON 28* 12/02/2015   TIBC 337 12/02/2015    IRONPCTSAT 8* 12/02/2015    Lab Results  Component Value Date   FERRITIN 13.8 12/02/2015     STUDIES: Ct Angio Chest W/cm &/or Wo Cm  12/07/2015  CLINICAL DATA:  Pt c/o bilateral lower extremity swelling with mediastinal chest pain x 2-3 weeks. NKI. EXAM: CT ANGIOGRAPHY CHEST WITH CONTRAST TECHNIQUE: Multidetector CT imaging of the chest was performed using the standard protocol during bolus administration of intravenous contrast. Multiplanar CT image reconstructions and MIPs were obtained to evaluate the vascular anatomy. CONTRAST:  100 mL Isovue 370 COMPARISON:  04/05/2015, 02/27/2013 FINDINGS: There is adequate opacification of the pulmonary arteries. There is no pulmonary embolus. The main pulmonary artery, right main pulmonary artery and left main pulmonary arteries are normal in size. The heart size is normal. There is no pericardial effusion. 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 10 x 8 mm ground-glass left upper lobe pulmonary nodule (image 22/ series 6). At 9 x 8 mm ground-glass left upper lobe pulmonary nodule (image 22/ series 6). There is no focal consolidation, pleural effusion or pneumothorax. There is no axillary, hilar, or mediastinal adenopathy. There is no lytic or blastic osseous lesion. The visualized portions of the upper abdomen are unremarkable. 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 Review of the MIP images confirms the above findings. IMPRESSION: 1. No evidence of pulmonary embolus. 2. No acute cardiopulmonary disease. 3. Two stable ground-glass left upper lobe pulmonary nodules unchanged compared with 02/27/2013. Electronically Signed   By: Kathreen Devoid   On: 12/07/2015 13:58    ASSESSMENT: Iron deficiency anemia.  PLAN:    1. Iron deficiency anemia: Likely secondary to poor absorption secondary to history of gastric bypass. Patient's hemoglobin and iron stores have trended down and she will benefit from IV iron. Patient is going on a trip soon,  therefore she will return tomorrow and then again in early next week to receive 510 mg IV Feraheme. She will then return to clinic in 6 weeks with repeat laboratory work, further evaluation, and consideration of additional IV iron.   Patient expressed  understanding and was in agreement with this plan. She also understands that She can call clinic at any time with any questions, concerns, or complaints.   Lloyd Huger, MD   01/01/2016 7:25 AM

## 2016-01-02 ENCOUNTER — Ambulatory Visit: Payer: Medicare Other | Admitting: Hematology and Oncology

## 2016-01-18 ENCOUNTER — Encounter: Payer: Medicare Other | Admitting: Internal Medicine

## 2016-01-18 DIAGNOSIS — M79609 Pain in unspecified limb: Secondary | ICD-10-CM | POA: Diagnosis not present

## 2016-01-18 DIAGNOSIS — M7989 Other specified soft tissue disorders: Secondary | ICD-10-CM | POA: Diagnosis not present

## 2016-01-19 ENCOUNTER — Inpatient Hospital Stay: Payer: Medicare Other | Attending: Hematology and Oncology

## 2016-01-19 VITALS — BP 106/68 | HR 59 | Temp 96.7°F | Resp 18

## 2016-01-19 DIAGNOSIS — N898 Other specified noninflammatory disorders of vagina: Secondary | ICD-10-CM | POA: Diagnosis not present

## 2016-01-19 DIAGNOSIS — R531 Weakness: Secondary | ICD-10-CM | POA: Insufficient documentation

## 2016-01-19 DIAGNOSIS — Z9884 Bariatric surgery status: Secondary | ICD-10-CM | POA: Insufficient documentation

## 2016-01-19 DIAGNOSIS — I447 Left bundle-branch block, unspecified: Secondary | ICD-10-CM | POA: Diagnosis not present

## 2016-01-19 DIAGNOSIS — E119 Type 2 diabetes mellitus without complications: Secondary | ICD-10-CM | POA: Insufficient documentation

## 2016-01-19 DIAGNOSIS — R5383 Other fatigue: Secondary | ICD-10-CM | POA: Insufficient documentation

## 2016-01-19 DIAGNOSIS — K222 Esophageal obstruction: Secondary | ICD-10-CM | POA: Insufficient documentation

## 2016-01-19 DIAGNOSIS — M4802 Spinal stenosis, cervical region: Secondary | ICD-10-CM | POA: Insufficient documentation

## 2016-01-19 DIAGNOSIS — E279 Disorder of adrenal gland, unspecified: Secondary | ICD-10-CM | POA: Insufficient documentation

## 2016-01-19 DIAGNOSIS — Z79899 Other long term (current) drug therapy: Secondary | ICD-10-CM | POA: Diagnosis not present

## 2016-01-19 DIAGNOSIS — D649 Anemia, unspecified: Secondary | ICD-10-CM | POA: Insufficient documentation

## 2016-01-19 DIAGNOSIS — R102 Pelvic and perineal pain: Secondary | ICD-10-CM | POA: Diagnosis not present

## 2016-01-19 DIAGNOSIS — R42 Dizziness and giddiness: Secondary | ICD-10-CM | POA: Insufficient documentation

## 2016-01-19 DIAGNOSIS — R079 Chest pain, unspecified: Secondary | ICD-10-CM | POA: Diagnosis not present

## 2016-01-19 DIAGNOSIS — Z8042 Family history of malignant neoplasm of prostate: Secondary | ICD-10-CM | POA: Insufficient documentation

## 2016-01-19 DIAGNOSIS — K589 Irritable bowel syndrome without diarrhea: Secondary | ICD-10-CM | POA: Insufficient documentation

## 2016-01-19 DIAGNOSIS — M129 Arthropathy, unspecified: Secondary | ICD-10-CM | POA: Diagnosis not present

## 2016-01-19 DIAGNOSIS — D509 Iron deficiency anemia, unspecified: Secondary | ICD-10-CM | POA: Insufficient documentation

## 2016-01-19 DIAGNOSIS — G4730 Sleep apnea, unspecified: Secondary | ICD-10-CM | POA: Insufficient documentation

## 2016-01-19 DIAGNOSIS — E1142 Type 2 diabetes mellitus with diabetic polyneuropathy: Secondary | ICD-10-CM | POA: Insufficient documentation

## 2016-01-19 DIAGNOSIS — I10 Essential (primary) hypertension: Secondary | ICD-10-CM | POA: Diagnosis not present

## 2016-01-19 DIAGNOSIS — E669 Obesity, unspecified: Secondary | ICD-10-CM | POA: Insufficient documentation

## 2016-01-19 DIAGNOSIS — G2581 Restless legs syndrome: Secondary | ICD-10-CM | POA: Diagnosis not present

## 2016-01-19 DIAGNOSIS — F329 Major depressive disorder, single episode, unspecified: Secondary | ICD-10-CM | POA: Insufficient documentation

## 2016-01-19 DIAGNOSIS — N390 Urinary tract infection, site not specified: Secondary | ICD-10-CM | POA: Insufficient documentation

## 2016-01-19 MED ORDER — SODIUM CHLORIDE 0.9 % IV SOLN
510.0000 mg | Freq: Once | INTRAVENOUS | Status: AC
  Administered 2016-01-19: 510 mg via INTRAVENOUS
  Filled 2016-01-19: qty 17

## 2016-01-19 MED ORDER — SODIUM CHLORIDE 0.9 % IV SOLN
Freq: Once | INTRAVENOUS | Status: AC
  Administered 2016-01-19: 20 mL/h via INTRAVENOUS
  Filled 2016-01-19: qty 1000

## 2016-01-24 ENCOUNTER — Telehealth: Payer: Self-pay | Admitting: Internal Medicine

## 2016-01-24 ENCOUNTER — Telehealth: Payer: Self-pay | Admitting: *Deleted

## 2016-01-24 NOTE — Telephone Encounter (Signed)
Pt said she is waiting for her vein clinic results, she was suppose to be called you 12 yesterday, (as per pt). Pt also states do not put results into her MYchart. Please call her at [phone].

## 2016-01-24 NOTE — Telephone Encounter (Signed)
No have to be requested.

## 2016-01-24 NOTE — Telephone Encounter (Signed)
Explained to patient, I have to request those records.  thanks  Only did a doppler test test, no exam, no measurements were done at that appt.

## 2016-01-24 NOTE — Telephone Encounter (Signed)
Patient stated that [facility], she requested her doppler results. She stated that she was under the impression that she would see a provider while at [facility], however she did not. She stated that she thought she would get fitted for hoses while there as well, however this did not happen.  Pt contact [phone]

## 2016-01-24 NOTE — Telephone Encounter (Signed)
Would you like me to do that? Or has it been done, thanks?

## 2016-01-24 NOTE — Telephone Encounter (Signed)
Have we received results, I don't see them in the chart, thanks

## 2016-01-25 NOTE — Telephone Encounter (Signed)
  called [facility] had [place] results faxed over placed in yellow results folder.

## 2016-01-31 ENCOUNTER — Telehealth: Payer: Self-pay | Admitting: Internal Medicine

## 2016-01-31 NOTE — Telephone Encounter (Signed)
Noted, thanks!

## 2016-01-31 NOTE — Telephone Encounter (Signed)
Spoke with the patient, she has a sore/wort/blister on the outside of her vagina.  It is on the inner fold of the outside lip. It is not draining, has been putting hot compresses on it and a anitibiotic cream that she had at home.  She states it is uncomfortable to sit.  I have gotten Arnett to see her tomorrow at 3pm.  thanks

## 2016-01-31 NOTE — Telephone Encounter (Signed)
Please call patient at [phone] Patient will need a appt asap with [name], she has a sore on outer part of the lip of the vagina.

## 2016-01-31 NOTE — Telephone Encounter (Signed)
Left a VM to return my call. thanks

## 2016-01-31 NOTE — Telephone Encounter (Signed)
ultrasound done at [facility] on July 5 received,  No DVT,  No refllux.  The fluid retention may be from medications she is taking or from sleep apnea not being adequately treated,  She can use furosemide 20 mg in the am AS NEEDED  ,  Refill med if she requests, it

## 2016-01-31 NOTE — Telephone Encounter (Signed)
Denise Sharp, please open her 3pm tomorrow, thanks

## 2016-01-31 NOTE — Telephone Encounter (Signed)
The appointment has been scheduled.

## 2016-02-01 ENCOUNTER — Other Ambulatory Visit: Payer: Self-pay

## 2016-02-01 ENCOUNTER — Encounter: Payer: Self-pay | Admitting: Family

## 2016-02-01 ENCOUNTER — Ambulatory Visit (INDEPENDENT_AMBULATORY_CARE_PROVIDER_SITE_OTHER): Payer: Medicare Other | Admitting: Family

## 2016-02-01 VITALS — BP 106/62 | HR 66 | Temp 98.7°F | Ht 65.0 in | Wt 237.2 lb

## 2016-02-01 DIAGNOSIS — R0602 Shortness of breath: Secondary | ICD-10-CM

## 2016-02-01 DIAGNOSIS — B373 Candidiasis of vulva and vagina: Secondary | ICD-10-CM

## 2016-02-01 DIAGNOSIS — R197 Diarrhea, unspecified: Secondary | ICD-10-CM

## 2016-02-01 DIAGNOSIS — N390 Urinary tract infection, site not specified: Secondary | ICD-10-CM

## 2016-02-01 DIAGNOSIS — G4700 Insomnia, unspecified: Secondary | ICD-10-CM

## 2016-02-01 DIAGNOSIS — B3731 Acute candidiasis of vulva and vagina: Secondary | ICD-10-CM

## 2016-02-01 DIAGNOSIS — L0292 Furuncle, unspecified: Secondary | ICD-10-CM

## 2016-02-01 LAB — POCT URINALYSIS DIPSTICK
Bilirubin, UA: NEGATIVE
Glucose, UA: NEGATIVE
Ketones, UA: NEGATIVE
Nitrite, UA: POSITIVE
Spec Grav, UA: 1.015
Urobilinogen, UA: 0.2
pH, UA: 5

## 2016-02-01 MED ORDER — MUPIROCIN CALCIUM 2 % EX CREA: 1.0000 "application " | TOPICAL_CREAM | Freq: Three times a day (TID) | CUTANEOUS | Status: DC

## 2016-02-01 MED ORDER — LUNESTA 2 MG PO TABS: 2.0000 mg | ORAL_TABLET | Freq: Every evening | ORAL | Status: DC | PRN

## 2016-02-01 MED ORDER — FLUCONAZOLE 150 MG PO TABS: 150.0000 mg | ORAL_TABLET | Freq: Once | ORAL | Status: DC

## 2016-02-01 NOTE — Progress Notes (Signed)
Pre visit review using our clinic review tool, if applicable. No additional management support is needed unless otherwise documented below in the visit note. 

## 2016-02-01 NOTE — Telephone Encounter (Signed)
error 

## 2016-02-01 NOTE — Progress Notes (Signed)
Subjective:    Patient ID: Denise Sharp, female    DOB: 14-Sep-1945, 69 y.o.   MRN: [id number]   Denise Sharp is a 70 y.o. female who presents today for an acute visit.    HPI Comments: Patient here for evaluation of external vaginal lesion noticed on right labia for past 2 weeks, improving. Nondraining. Describes uncomfortable to sit.Thought it was related to her InterStim for urinary control until her husband saw a lesion. Describes redness, some itching under pannus.  Follows with urology and seeing her doctor next week. Has been applying mupirocin and warm compresses. No fever, chills.   Patient also request refill for lomotil for diarrhea and albuterol for SOB, both stable.  Past Medical History  Diagnosis Date  . Diabetes mellitus   . Depression   . Hypertension   . Cervical spinal stenosis 1994    due to trauma to back ([employer] accident), has intermittent paralysis and parasthesias  . Adrenal mass ([place])   . IBS (irritable bowel syndrome)   . Obstructive sleep apnea     using CPAP  . Anemia     iron deficient post  2 unit txfsn 2009, normal endo/colonoscopy by [facility]  . Esophageal stenosis March 2011    with transietn outlet obstruction by food, cleared by EGD   . LBBB (left bundle branch block)   . Obesity   . Apnea   . Headache(784.0)   . Arthritis   . Restless leg syndrome   . Dizziness     chronic dizziness  . Adrenal mass, left ([place])   . 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 Abnormality of gait 03/25/2013  . Syncope and collapse 03/12/2014  . Cervicogenic headache 03/23/2014   Allergies: Diovan; Erythromycin; Flagyl; Glucophage; Xanax; Amoxicillin; Demeclocycline; Erythromycin base; Metformin; Sulfa antibiotics; and Tetracyclines & related Current Outpatient Prescriptions on File Prior to Visit  Medication Sig Dispense Refill  . alosetron (LOTRONEX) 1 MG tablet Take 1 tablet (1 mg total) by mouth 2 (two) times daily. 60 tablet 11  . benzonatate  (TESSALON) 200 MG capsule Take 1 capsule (200 mg total) by mouth 3 (three) times daily as needed for cough. 180 capsule 1  . Blood Pressure Monitoring (BLOOD PRESSURE MONITOR/ARM) DEVI Automatic BP machine.  Normal adult size arm (Patient not taking: Reported on 12/13/2015) 1 Device 0  . Cholecalciferol (VITAMIN D3) 10000 UNITS capsule Take 10,000 Units by mouth once a week. Reported on 12/22/2015    . cyanocobalamin (,VITAMIN B-12,) 1000 MCG/ML injection 1000 ml (1 ml) injection two times  monthly 10 mL 3  . diazepam (VALIUM) 10 MG tablet Take 1 tablet (10 mg total) by mouth at bedtime as needed for anxiety. 21 tablet 0  . Eluxadoline 100 MG TABS Take by mouth. Reported on 12/13/2015    . ergocalciferol (DRISDOL) 50000 units capsule Take by mouth. Reported on 12/13/2015    . furosemide (LASIX) 20 MG tablet Take 1 tablet (20 mg total) by mouth daily. (Patient not taking: Reported on 12/13/2015) 30 tablet 3  . Glucose Blood (BAYER BREEZE 2 TEST) DISK Use one test strip each time glucose needs testing. (Patient not taking: Reported on 12/22/2015) 50 each 2  . hydrocodone-acetaminophen (LORCET-HD) 5-500 MG per capsule Take 1 capsule by mouth every 6 (six) hours as needed. 30 capsule 3  . loperamide (IMODIUM A-D) 2 MG tablet Take by mouth.    Johnnye Sima 2 MG TABS tablet Take 1 tablet (2 mg total) by mouth at bedtime  as needed for sleep. Take immediately before bedtime 21 tablet 0  . mometasone (ELOCON) 0.1 % cream Apply 1 application topically daily. 45 g 0  . nebivolol (BYSTOLIC) 5 MG tablet Take 1 tablet (5 mg total) by mouth daily. 90 tablet 1  . nystatin (MYCOSTATIN) powder Apply topically 2 (two) times daily. 60 g 3  . pregabalin (LYRICA) 100 MG capsule One capsule in the morning and two in the evening 63 capsule 0  . rOPINIRole (REQUIP) 1 MG tablet Take 1 tablet (1 mg total) by mouth at bedtime. 90 tablet 2  . sitaGLIPtin (JANUVIA) 100 MG tablet Take 1 tablet (100 mg total) by mouth daily. 90 tablet 2  .  traMADol (ULTRAM) 50 MG tablet Take 50 mg by mouth 4 (four) times daily as needed.     No current facility-administered medications on file prior to visit.    Social History  Substance Use Topics  . Smoking status: Never Smoker   . Smokeless tobacco: Never Used  . Alcohol Use: No    Review of Systems  Constitutional: Negative for fever and chills.  Respiratory: Negative for shortness of breath and wheezing.   Cardiovascular: Negative for chest pain and palpitations.  Gastrointestinal: Negative for nausea and vomiting.  Skin: Positive for rash and wound.  Neurological: Negative for dizziness and headaches.      Objective:    BP 106/62 mmHg  Pulse 66  Temp(Src) 98.7 F (37.1 C)  Ht 5\' 5"  (1.651 m)  Wt 237 lb 3.2 oz (107.593 kg)  BMI 39.47 kg/m2  SpO2 98%   Physical Exam  Constitutional: She appears well-developed and well-nourished.  Eyes: Conjunctivae are normal.  Cardiovascular: Normal rate, regular rhythm, normal heart sounds and normal pulses.   Pulmonary/Chest: Effort normal and breath sounds normal. She has no wheezes. She has no rhonchi. She has no rales.  Abdominal: There is no CVA tenderness.  Genitourinary: There is tenderness and lesion on the right labia. There is no rash on the right labia. There is no rash, tenderness or lesion on the left labia. No vaginal discharge found.  Diffuse erythema under pannus, inbetween legs, and external vagina. Right labia <1cm papule, tender to palpation. Non draining or fluctuant.   Neurological: She is alert.  Skin: Skin is warm and dry.  Psychiatric: She has a normal mood and affect. Her speech is normal and behavior is normal. Thought content normal.  Vitals reviewed.      Assessment & Plan:  1. Vulvovaginal candidiasis Trial of diflucan due to severity of suspected candidiasis.   - fluconazole (DIFLUCAN) 150 MG tablet; Take 1 tablet (150 mg total) by mouth once. Take one tablet PO once. If continue to have symptoms,  may take one tablet PO 3 days later.  Dispense: 2 tablet; Refill: 1 - POCT urinalysis dipstick - CULTURE, URINE COMPREHENSIVE; Future  2. Boil Largely resolved. No systemic features. No fluctuant boil to warrant I &D. No h/o MRSA.  - mupirocin cream (BACTROBAN) 2 %; Apply 1 application topically 3 (three) times daily.  Dispense: 15 g; Refill: 0  3. SOB Stable, refilled albuterol inhaler.   4. Diarrhea Chronic, refilled lomotil  5. UTI UA positive for leukocytes, nitrites, blood. Will treat empirically for UTI. Pending culture.   I am having Denise Sharp maintain her hydrocodone-acetaminophen, traMADol, Vitamin D3, benzonatate, nystatin, sitaGLIPtin, Blood Pressure Monitor/Arm, loperamide, ergocalciferol, Eluxadoline, alosetron, Glucose Blood, nebivolol, rOPINIRole, furosemide, mometasone, cyanocobalamin, diazepam, LUNESTA, pregabalin, albuterol, and diphenoxylate-atropine.   Meds ordered this  encounter  Medications  . albuterol (PROAIR HFA) 108 (90 Base) MCG/ACT inhaler    Sig: Inhale into the lungs.  . diphenoxylate-atropine (LOMOTIL) 2.5-0.025 MG tablet    Sig: Take by mouth.    Return precautions given.   Risks, benefits, and alternatives of the medications and treatment plan prescribed today were discussed, and patient expressed understanding.   Education regarding symptom management and diagnosis given to patient on AVS.  Continue to follow with TULLO, Aris Everts, MD for routine health maintenance.   Denise Sharp and I agreed with plan.   Mable Paris, FNP

## 2016-02-01 NOTE — Patient Instructions (Signed)
If you are unable to tolerate the 1-2 doses of antifungal medications are prescribed, you may also use over-the-counter Monistat cream.  I suspect she had a boil which looks largely resolved today. Continue with warm compresses and topical ointment  Urine analysis.   If there is no improvement in your symptoms, or if there is any worsening of symptoms, or if you have any additional concerns, please return for re-evaluation; or, if we are closed, consider going to the Emergency Room for evaluation if symptoms urgent.  Abscess An abscess is an infected area that contains a collection of pus and debris.It can occur in almost any part of the body. An abscess is also known as a furuncle or boil. CAUSES  An abscess occurs when tissue gets infected. This can occur from blockage of oil or sweat glands, infection of hair follicles, or a minor injury to the skin. As the body tries to fight the infection, pus collects in the area and creates pressure under the skin. This pressure causes pain. People with weakened immune systems have difficulty fighting infections and get certain abscesses more often.  SYMPTOMS Usually an abscess develops on the skin and becomes a painful mass that is red, warm, and tender. If the abscess forms under the skin, you may feel a moveable soft area under the skin. Some abscesses break open (rupture) on their own, but most will continue to get worse without care. The infection can spread deeper into the body and eventually into the bloodstream, causing you to feel ill.  DIAGNOSIS  Your caregiver will take your medical history and perform a physical exam. A sample of fluid may also be taken from the abscess to determine what is causing your infection. TREATMENT  Your caregiver may prescribe antibiotic medicines to fight the infection. However, taking antibiotics alone usually does not cure an abscess. Your caregiver may need to make a small cut (incision) in the abscess to drain the  pus. In some cases, gauze is packed into the abscess to reduce pain and to continue draining the area. HOME CARE INSTRUCTIONS   Only take over-the-counter or prescription medicines for pain, discomfort, or fever as directed by your caregiver.  If you were prescribed antibiotics, take them as directed. Finish them even if you start to feel better.  If gauze is used, follow your caregiver's directions for changing the gauze.  To avoid spreading the infection:  Keep your draining abscess covered with a bandage.  Wash your hands well.  Do not share personal care items, towels, or whirlpools with others.  Avoid skin contact with others.  Keep your skin and clothes clean around the abscess.  Keep all follow-up appointments as directed by your caregiver. SEEK MEDICAL CARE IF:   You have increased pain, swelling, redness, fluid drainage, or bleeding.  You have muscle aches, chills, or a general ill feeling.  You have a fever. MAKE SURE YOU:   Understand these instructions.  Will watch your condition.  Will get help right away if you are not doing well or get worse.   This information is not intended to replace advice given to you by your health care provider. Make sure you discuss any questions you have with your health care provider.   Document Released: 04/11/2005 Document Revised: 01/01/2012 Document Reviewed: 09/14/2011 Elsevier Interactive Patient Education [employer].

## 2016-02-02 ENCOUNTER — Telehealth: Payer: Self-pay | Admitting: Family

## 2016-02-02 DIAGNOSIS — B3731 Acute candidiasis of vulva and vagina: Secondary | ICD-10-CM

## 2016-02-02 DIAGNOSIS — B373 Candidiasis of vulva and vagina: Secondary | ICD-10-CM

## 2016-02-02 DIAGNOSIS — N390 Urinary tract infection, site not specified: Secondary | ICD-10-CM

## 2016-02-02 DIAGNOSIS — G4700 Insomnia, unspecified: Secondary | ICD-10-CM

## 2016-02-02 DIAGNOSIS — L0292 Furuncle, unspecified: Secondary | ICD-10-CM

## 2016-02-02 MED ORDER — MUPIROCIN CALCIUM 2 % EX CREA: 1.0000 "application " | TOPICAL_CREAM | Freq: Three times a day (TID) | CUTANEOUS | Status: DC

## 2016-02-02 MED ORDER — FLUCONAZOLE 150 MG PO TABS: 150.0000 mg | ORAL_TABLET | Freq: Once | ORAL | Status: DC

## 2016-02-02 MED ORDER — CIPROFLOXACIN HCL 250 MG PO TABS: 250.0000 mg | ORAL_TABLET | Freq: Two times a day (BID) | ORAL | Status: DC

## 2016-02-02 MED ORDER — LUNESTA 2 MG PO TABS: 2.0000 mg | ORAL_TABLET | Freq: Every evening | ORAL | Status: DC | PRN

## 2016-02-02 NOTE — Telephone Encounter (Signed)
Patient was informed of results.  Patient understood and no questions, comments, or concerns at this time.  

## 2016-02-02 NOTE — Telephone Encounter (Signed)
Please call patient and let her know urinalysis shows UTI. [facility] where I also sent mupiricin cream and diflucan ( these also accidentally sent to her mail order pharm as well).   I know she does like to take oral antibiotics because of diarrhea. Please advise her that there are no topical solutions for a UTI. I chose the antibiotic with the SHORTEST course ( 3 days) to hopefully lessen that.  Also, please let her know that a rare side effect , however one that I tell all patients, is tendon rupture on cipro. If she has any acute leg pain, please stop drug and call our office.

## 2016-02-04 LAB — CULTURE, URINE COMPREHENSIVE: Colony Count: >=100000

## 2016-02-06 ENCOUNTER — Inpatient Hospital Stay: Payer: Medicare Other

## 2016-02-06 ENCOUNTER — Inpatient Hospital Stay (HOSPITAL_BASED_OUTPATIENT_CLINIC_OR_DEPARTMENT_OTHER): Payer: Medicare Other | Admitting: Oncology

## 2016-02-06 VITALS — BP 134/79 | HR 71 | Temp 97.1°F | Resp 18 | Wt 236.9 lb

## 2016-02-06 DIAGNOSIS — R102 Pelvic and perineal pain: Secondary | ICD-10-CM

## 2016-02-06 DIAGNOSIS — M129 Arthropathy, unspecified: Secondary | ICD-10-CM

## 2016-02-06 DIAGNOSIS — R42 Dizziness and giddiness: Secondary | ICD-10-CM

## 2016-02-06 DIAGNOSIS — G2581 Restless legs syndrome: Secondary | ICD-10-CM

## 2016-02-06 DIAGNOSIS — E1142 Type 2 diabetes mellitus with diabetic polyneuropathy: Secondary | ICD-10-CM

## 2016-02-06 DIAGNOSIS — I447 Left bundle-branch block, unspecified: Secondary | ICD-10-CM

## 2016-02-06 DIAGNOSIS — Z8042 Family history of malignant neoplasm of prostate: Secondary | ICD-10-CM

## 2016-02-06 DIAGNOSIS — K589 Irritable bowel syndrome without diarrhea: Secondary | ICD-10-CM

## 2016-02-06 DIAGNOSIS — I10 Essential (primary) hypertension: Secondary | ICD-10-CM

## 2016-02-06 DIAGNOSIS — D509 Iron deficiency anemia, unspecified: Secondary | ICD-10-CM

## 2016-02-06 DIAGNOSIS — R531 Weakness: Secondary | ICD-10-CM

## 2016-02-06 DIAGNOSIS — Z79899 Other long term (current) drug therapy: Secondary | ICD-10-CM | POA: Diagnosis not present

## 2016-02-06 DIAGNOSIS — N898 Other specified noninflammatory disorders of vagina: Secondary | ICD-10-CM

## 2016-02-06 DIAGNOSIS — Z9884 Bariatric surgery status: Secondary | ICD-10-CM

## 2016-02-06 DIAGNOSIS — R5383 Other fatigue: Secondary | ICD-10-CM | POA: Diagnosis not present

## 2016-02-06 DIAGNOSIS — D649 Anemia, unspecified: Secondary | ICD-10-CM

## 2016-02-06 DIAGNOSIS — G4730 Sleep apnea, unspecified: Secondary | ICD-10-CM

## 2016-02-06 DIAGNOSIS — N390 Urinary tract infection, site not specified: Secondary | ICD-10-CM

## 2016-02-06 DIAGNOSIS — K222 Esophageal obstruction: Secondary | ICD-10-CM

## 2016-02-06 DIAGNOSIS — E279 Disorder of adrenal gland, unspecified: Secondary | ICD-10-CM

## 2016-02-06 DIAGNOSIS — M4802 Spinal stenosis, cervical region: Secondary | ICD-10-CM

## 2016-02-06 DIAGNOSIS — F329 Major depressive disorder, single episode, unspecified: Secondary | ICD-10-CM

## 2016-02-06 DIAGNOSIS — R079 Chest pain, unspecified: Secondary | ICD-10-CM

## 2016-02-06 DIAGNOSIS — E119 Type 2 diabetes mellitus without complications: Secondary | ICD-10-CM

## 2016-02-06 DIAGNOSIS — E669 Obesity, unspecified: Secondary | ICD-10-CM

## 2016-02-06 LAB — CBC WITH DIFFERENTIAL/PLATELET
Basophils Absolute: 0.1 10*3/uL (ref 0–0.1)
Basophils Relative: 1 %
Eosinophils Absolute: 0.1 10*3/uL (ref 0–0.7)
Eosinophils Relative: 2 %
HCT: 39.5 % (ref 35.0–47.0)
Hemoglobin: 12.7 g/dL (ref 12.0–16.0)
Lymphocytes Relative: 24 %
Lymphs Abs: 1.9 10*3/uL (ref 1.0–3.6)
MCH: 27.7 pg (ref 26.0–34.0)
MCHC: 32.1 g/dL (ref 32.0–36.0)
MCV: 86.4 fL (ref 80.0–100.0)
Monocytes Absolute: 0.5 10*3/uL (ref 0.2–0.9)
Monocytes Relative: 6 %
Neutro Abs: 5.3 10*3/uL (ref 1.4–6.5)
Neutrophils Relative %: 67 %
Platelets: 260 10*3/uL (ref 150–440)
RBC: 4.57 MIL/uL (ref 3.80–5.20)
RDW: 19.5 % — ABNORMAL HIGH (ref 11.5–14.5)
WBC: 7.8 10*3/uL (ref 3.6–11.0)

## 2016-02-06 LAB — IRON AND TIBC
Iron: 52 ug/dL (ref 28–170)
Saturation Ratios: 20 % (ref 10.4–31.8)
TIBC: 257 ug/dL (ref 250–450)
UIBC: 206 ug/dL

## 2016-02-06 LAB — FERRITIN: Ferritin: 242 ng/mL (ref 11–307)

## 2016-02-06 NOTE — Progress Notes (Signed)
Denise Sharp  [phone] Fax:([phone]) [phone]  ID: Denise Sharp OB: December 26, 1945  MR#: [id number]  TIR#:[id number]  Patient Care Team: [name], MD as PCP - General (Internal Medicine) [facility] as Referring Physician  CHIEF COMPLAINT: Iron deficiency anemia.  INTERVAL HISTORY: Patient returns to clinic today for repeat laboratory work and further evaluation. She continues to have weakness and fatigue. She also has pelvic pain and discomfort with urination secondary to vaginal cyst and UTI. She has been initiated on Cipro by her primary care physician and has an appointment with her urologist tomorrow. She has no neurologic complaints. She denies any recent fevers or illnesses. She has a good appetite and denies weight loss. She denies any chest pain or shortness of breath. She denies any nausea, vomiting, constipation, or diarrhea. She has no melena or hematochezia. She has no urinary complaints. Patient otherwise feels well and offers no further specific complaints today.  REVIEW OF SYSTEMS:   Review of Systems  Constitutional: Positive for malaise/fatigue. Negative for fever and weight loss.  Respiratory: Negative.  Negative for shortness of breath.   Cardiovascular: Negative.  Negative for chest pain.  Gastrointestinal: Negative.  Negative for blood in stool and melena.  Genitourinary: Negative.   Musculoskeletal: Negative.   Neurological: Positive for weakness.  Psychiatric/Behavioral: Negative.     As per HPI. Otherwise, a complete review of systems is negatve.  PAST MEDICAL HISTORY: Past Medical History  Diagnosis Date  . Diabetes mellitus   . Depression   . Hypertension   . Cervical spinal stenosis 1994    due to trauma to back ([employer] accident), has intermittent paralysis and parasthesias  . Adrenal mass ([place])   . IBS (irritable bowel syndrome)   . Obstructive sleep apnea     using CPAP  . Anemia     iron deficient  post  2 unit txfsn 2009, normal end[facility]/colonoscopy [facility]  . Esophageal stenosis March 2011    with transietn outlet obstruction by food, cleared by EGD   . LBBB (left bundle branch block)   . Obesity   . Apnea   . Headache(784.0)   . Arthritis   . Restless leg syndrome   . Dizziness     chronic dizziness  . Adrenal mass, left ([place])   . 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 Abnormality of gait 03/25/2013  . Syncope and collapse 03/12/2014  . Cervicogenic headache 03/23/2014    PAST SURGICAL HISTORY: Past Surgical History  Procedure Laterality Date  . Gastric bypass    . Abdominal hysterectomy    . Gastric bypass  2000, 2005    Dr. Debroah Loop  . Appendectomy    . Gallbladder surgery  1976  . Rotator cuff repair      right  . Joint replacement  2007    bilateral knee. [name],  [name]  . Spine surgery  1995    [place]  . Interstim implant placement  April 2013    [place]  . Gallbladder surgery  resection  . Total abdominal hysterectomy w/ bilateral salpingoophorectomy  1974  . Umbilical hernia repair  Aug 11, 2015  . Diaphragmatic hernia repair  2015    FAMILY HISTORY Family History  Problem Relation Age of Onset  . Heart disease Father   . Hypertension Father   . Prostate cancer Father   . Stroke Father   . Osteoporosis Father   . Stroke Mother   . Depression Mother   . Headache Mother   .  Heart disease Mother   . Thyroid disease Mother   . Hypertension Mother   . Diabetes Daughter   . Heart disease Daughter   . Hypertension Daughter   . Hypertension Son        ADVANCED DIRECTIVES:    HEALTH MAINTENANCE: Social History  Substance Use Topics  . Smoking status: Never Smoker   . Smokeless tobacco: Never Used  . Alcohol Use: No     Colonoscopy:  PAP:  Bone density:  Lipid panel:  Allergies  Allergen Reactions  . Diovan [Valsartan]   . Erythromycin   . Flagyl [Metronidazole]   . Glucophage [Metformin Hcl]   . Xanax [Alprazolam]   .  Amoxicillin Diarrhea  . Demeclocycline Hives  . Erythromycin Base   . Metformin   . Sulfa Antibiotics Rash    As child  . Tetracyclines & Related Hives and Rash    Current Outpatient Prescriptions  Medication Sig Dispense Refill  . alosetron (LOTRONEX) 1 MG tablet Take 1 tablet (1 mg total) by mouth 2 (two) times daily. 60 tablet 11  . benzonatate (TESSALON) 200 MG capsule Take 1 capsule (200 mg total) by mouth 3 (three) times daily as needed for cough. 180 capsule 1  . Cholecalciferol (VITAMIN D3) 10000 UNITS capsule Take 10,000 Units by mouth once a week. Reported on 12/22/2015    . cyanocobalamin (,VITAMIN B-12,) 1000 MCG/ML injection 1000 ml (1 ml) injection two times  monthly 10 mL 3  . Eluxadoline 100 MG TABS Take by mouth. Reported on 12/13/2015    . ergocalciferol (DRISDOL) 50000 units capsule Take by mouth. Reported on 12/13/2015    . hydrocodone-acetaminophen (LORCET-HD) 5-500 MG per capsule Take 1 capsule by mouth every 6 (six) hours as needed. 30 capsule 3  . loperamide (IMODIUM A-D) 2 MG tablet Take by mouth.    . mometasone (ELOCON) 0.1 % cream Apply 1 application topically daily. 45 g 0  . nebivolol (BYSTOLIC) 5 MG tablet Take 1 tablet (5 mg total) by mouth daily. 90 tablet 1  . nystatin (MYCOSTATIN) powder Apply topically 2 (two) times daily. 60 g 3  . rOPINIRole (REQUIP) 1 MG tablet Take 1 tablet (1 mg total) by mouth at bedtime. 90 tablet 2  . sitaGLIPtin (JANUVIA) 100 MG tablet Take 1 tablet (100 mg total) by mouth daily. 90 tablet 2  . traMADol (ULTRAM) 50 MG tablet Take 50 mg by mouth 4 (four) times daily as needed.    . Blood Pressure Monitoring (BLOOD PRESSURE MONITOR/ARM) DEVI Automatic BP machine.  Normal adult size arm (Patient not taking: Reported on 12/13/2015) 1 Device 0  . diazepam (VALIUM) 10 MG tablet Take 1 tablet (10 mg total) by mouth at bedtime as needed for anxiety. 21 tablet 0  . furosemide (LASIX) 20 MG tablet Take 1 tablet (20 mg total) by mouth daily.  (Patient not taking: Reported on 12/13/2015) 30 tablet 3  . Glucose Blood (BAYER BREEZE 2 TEST) DISK Use one test strip each time glucose needs testing. (Patient not taking: Reported on 12/22/2015) 50 each 2  . LUNESTA 2 MG TABS tablet Take 1 tablet (2 mg total) by mouth at bedtime as needed for sleep. Take immediately before bedtime 21 tablet 0  . pregabalin (LYRICA) 100 MG capsule One capsule in the morning and two in the evening 63 capsule 0   No current facility-administered medications for this visit.    OBJECTIVE: Filed Vitals:   12/22/15 1201  BP: 132/69  Pulse: 49  Resp: 18     There is no weight on file to calculate BMI.    ECOG FS:0 - Asymptomatic  General: Well-developed, well-nourished, no acute distress. Eyes: Pink conjunctiva, anicteric sclera. Lungs: Clear to auscultation bilaterally. Heart: Regular rate and rhythm. No rubs, murmurs, or gallops. Abdomen: Soft, nontender, nondistended. No organomegaly noted, normoactive bowel sounds. Musculoskeletal: No edema, cyanosis, or clubbing. Neuro: Alert, answering all questions appropriately. Cranial nerves grossly intact. Skin: No rashes or petechiae noted. Psych: Normal affect.  LAB RESULTS:  Lab Results  Component Value Date   NA 141 12/07/2015   K 3.9 12/07/2015   CL 106 12/07/2015   CO2 23 12/07/2015   GLUCOSE 140* 12/07/2015   BUN 13 12/07/2015   CREATININE 1.03* 12/07/2015   CALCIUM 9.0 12/07/2015   PROT 7.0 11/24/2015   ALBUMIN 3.8 11/24/2015   AST 14 11/24/2015   ALT 11 11/24/2015   ALKPHOS 126* 11/24/2015   BILITOT 0.5 11/24/2015   GFRNONAA 54* 12/07/2015   GFRAA >60 12/07/2015    CBC    Component Value Date/Time   WBC 7.8 02/06/2016 1253   RBC 4.57 02/06/2016 1253   HGB 12.7 02/06/2016 1253   HGB 13.1 06/21/2014 1349   HCT 39.5 02/06/2016 1253   HCT 41.4 06/21/2014 1349   PLT 260 02/06/2016 1253   PLT 308 06/21/2014 1349   MCV 86.4 02/06/2016 1253   MCV 88 06/21/2014 1349   MCH 27.7  02/06/2016 1253   MCHC 32.1 02/06/2016 1253   RDW 19.5 (H) 02/06/2016 1253   RDW 15.4 (H) 06/21/2014 1349   LYMPHSABS 1.9 02/06/2016 1253   LYMPHSABS 2.3 02/25/2014 2153   MONOABS 0.5 02/06/2016 1253   MONOABS 0.6 02/25/2014 2153   EOSABS 0.1 02/06/2016 1253   EOSABS 0.1 02/25/2014 2153   BASOSABS 0.1 02/06/2016 1253   BASOSABS 0.1 02/25/2014 2153                                     11/24/2015   Lab Results  Component Value Date   IRON 52 02/06/2016   TIBC 257 02/06/2016   IRONPCTSAT 20 02/06/2016   Lab Results  Component Value Date   FERRITIN 242 02/06/2016       STUDIES: Ct Angio Chest W/cm &/or Wo Cm  12/07/2015  CLINICAL DATA:  Pt c/o bilateral lower extremity swelling with mediastinal chest pain x 2-3 weeks. NKI. EXAM: CT ANGIOGRAPHY CHEST WITH CONTRAST TECHNIQUE: Multidetector CT imaging of the chest was performed using the standard protocol during bolus administration of intravenous contrast. Multiplanar CT image reconstructions and MIPs were obtained to evaluate the vascular anatomy. CONTRAST:  100 mL Isovue 370 COMPARISON:  04/05/2015, 02/27/2013 FINDINGS: There is adequate opacification of the pulmonary arteries. There is no pulmonary embolus. The main pulmonary artery, right main pulmonary artery and left main pulmonary arteries are normal in size. The heart size is normal. There is no pericardial effusion. 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 10 x 8 mm ground-glass left upper lobe pulmonary nodule (image 22/ series 6). At 9 x 8 mm ground-glass left upper lobe pulmonary nodule (image 22/ series 6). There is no focal consolidation, pleural effusion or pneumothorax. There is no axillary, hilar, or mediastinal adenopathy. There is no lytic or blastic osseous lesion. The visualized portions of the upper abdomen are unremarkable. 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 Review of the MIP images confirms the  above findings. IMPRESSION: 1. No  evidence of pulmonary embolus. 2. No acute cardiopulmonary disease. 3. Two stable ground-glass left upper lobe pulmonary nodules unchanged compared with 02/27/2013. Electronically Signed   By: Kathreen Devoid   On: 12/07/2015 13:58    ASSESSMENT: Iron deficiency anemia.  PLAN:    1. Iron deficiency anemia: Likely secondary to poor absorption secondary to history of gastric bypass. Patient's hemoglobin and iron stores are now within normal limits. She does not require additional IV iron. Patient last received Feraheme on January 19, 2016. No further intervention is needed. Return to clinic in 4 months with repeat laboratory work and further evaluation. 2. UTI: Continue Cipro as prescribed by primary care. 3. Vaginal cyst: Patient has an appointment with urology tomorrow for further evaluation.   Patient expressed understanding and was in agreement with this plan. She also understands that She can call clinic at any time with any questions, concerns, or complaints.   Lloyd Huger, MD   01/01/2016 7:25 AM

## 2016-02-06 NOTE — Progress Notes (Signed)
Has vaginal cyst which is causing pain and discomfort with urination. States also has UTI with right flank pain. Was started on cipro by PCP and pt is scheduled to see urologist (Dr. Jacqlyn Larsen) tomorrow.

## 2016-02-07 ENCOUNTER — Other Ambulatory Visit: Payer: Self-pay | Admitting: *Deleted

## 2016-02-07 DIAGNOSIS — N3941 Urge incontinence: Secondary | ICD-10-CM | POA: Diagnosis not present

## 2016-02-07 DIAGNOSIS — R339 Retention of urine, unspecified: Secondary | ICD-10-CM | POA: Diagnosis not present

## 2016-02-07 DIAGNOSIS — Z6841 Body Mass Index (BMI) 40.0 and over, adult: Secondary | ICD-10-CM | POA: Diagnosis not present

## 2016-02-07 DIAGNOSIS — G4700 Insomnia, unspecified: Secondary | ICD-10-CM

## 2016-02-07 DIAGNOSIS — R350 Frequency of micturition: Secondary | ICD-10-CM | POA: Diagnosis not present

## 2016-02-07 NOTE — Telephone Encounter (Signed)
Patient needs this sent to [facility] instead due to cost, thanks! It was refilled on 7/20.

## 2016-02-07 NOTE — Addendum Note (Signed)
Addended by: Bevelyn Ngo on: 02/07/2016 04:25 PM   Modules accepted: Orders

## 2016-02-07 NOTE — Telephone Encounter (Signed)
Patient needs a refill as one sent on 7/20 was sent locally and it is too expensive, need to be sent to [facility]. thanks

## 2016-02-07 NOTE — Telephone Encounter (Signed)
Patient requested to have her lunesta be refilled and sent over to [place] [facility] due [facility] Pt contact [phone]

## 2016-02-08 MED ORDER — LUNESTA 2 MG PO TABS: 2.0000 mg | ORAL_TABLET | Freq: Every evening | ORAL | 1 refills | Status: DC | PRN

## 2016-02-14 ENCOUNTER — Other Ambulatory Visit: Payer: Self-pay

## 2016-02-14 ENCOUNTER — Encounter: Payer: Self-pay | Admitting: Family

## 2016-02-14 MED ORDER — FUROSEMIDE 20 MG PO TABS: 20.0000 mg | ORAL_TABLET | Freq: Every day | ORAL | 5 refills | Status: DC

## 2016-02-14 MED ORDER — TRAMADOL HCL 50 MG PO TABS: 50.0000 mg | ORAL_TABLET | Freq: Four times a day (QID) | ORAL | 4 refills | Status: DC | PRN

## 2016-02-14 MED ORDER — PREGABALIN 50 MG PO CAPS: ORAL_CAPSULE | ORAL | 5 refills | Status: DC

## 2016-02-14 NOTE — Telephone Encounter (Signed)
Pt is requesting refills on the following meds. She said she needs lyrica 50mg  bc she takes 1/2 a cap in the mornings and 1/2 to 1 cap nightly.  She would like vitamin d3 10000 units sent to the [facility] pharmacy if she needs to continue taking it.  Tramadol was filled on 10/01/2012. lyrica was filled 12/23/15 Lasix was filled on 12/05/15 Last OV 12/13/15, OK TO REFILL?

## 2016-03-20 DIAGNOSIS — E113393 Type 2 diabetes mellitus with moderate nonproliferative diabetic retinopathy without macular edema, bilateral: Secondary | ICD-10-CM | POA: Diagnosis not present

## 2016-03-22 ENCOUNTER — Encounter: Payer: Self-pay | Admitting: Neurology

## 2016-03-22 ENCOUNTER — Ambulatory Visit (INDEPENDENT_AMBULATORY_CARE_PROVIDER_SITE_OTHER): Payer: Medicare Other | Admitting: Neurology

## 2016-03-22 VITALS — BP 124/65 | HR 75 | Ht 63.0 in | Wt 247.5 lb

## 2016-03-22 DIAGNOSIS — E1142 Type 2 diabetes mellitus with diabetic polyneuropathy: Secondary | ICD-10-CM | POA: Diagnosis not present

## 2016-03-22 DIAGNOSIS — R296 Repeated falls: Secondary | ICD-10-CM | POA: Diagnosis not present

## 2016-03-22 DIAGNOSIS — G2581 Restless legs syndrome: Secondary | ICD-10-CM

## 2016-03-22 MED ORDER — PREGABALIN 100 MG PO CAPS: 100.0000 mg | ORAL_CAPSULE | Freq: Two times a day (BID) | ORAL | Status: DC

## 2016-03-22 MED ORDER — LEVETIRACETAM 500 MG PO TABS: 500.0000 mg | ORAL_TABLET | Freq: Two times a day (BID) | ORAL | 3 refills | Status: DC

## 2016-03-22 NOTE — Patient Instructions (Signed)
   We will reduce the Lyrica to 100 mg twice a day. We will start Keppra 500 mg twice a day. Look out for drowsiness and irritability. If you do well on this new medication, we may eventually discontinue the lyrica.

## 2016-03-22 NOTE — Progress Notes (Signed)
Reason for visit: Peripheral neuropathy  Denise Sharp is an 70 y.o. female  History of present illness:  Denise Sharp is a 70 year old right-handed white female with a history of diabetes and a diabetic peripheral neuropathy. The patient has been increased on the Lyrica, but she has also had some increase in her dizziness, increased headaches, increased gait instability and swelling in the legs. The patient will stumble frequently, she may fall on occasion. She does have a cane for ambulation. The patient sleeps better on the Lyrica. She takes 100 mg in the morning and 200 mg in the evening. The patient does have a lift chair at home to help her stand up. The patient has had issues with low iron levels, she has required multiple IV iron infusions. The patient returns to this office for an evaluation.  Past Medical History:  Diagnosis Date  . Abnormality of gait 03/25/2013  . Adrenal mass ([place])   . Adrenal mass, left ([place])   . Anemia    iron deficient post  2 unit txfsn 2009, normal endo/colonoscopy by [facility]  . Apnea   . Arthritis   . Cervical spinal stenosis 1994   due to trauma to back ([employer] accident), has intermittent paralysis and parasthesias  . Cervicogenic headache 03/23/2014  . Depression   . Diabetes mellitus   . Dizziness    chronic dizziness  . Esophageal stenosis March 2011   with transietn outlet obstruction by food, cleared by EGD   . Headache(784.0)   . Hypertension   . IBS (irritable bowel syndrome)   . LBBB (left bundle branch block)   . Obesity   . Obstructive sleep apnea    using CPAP  . 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 Restless leg syndrome   . Syncope and collapse 03/12/2014    Past Surgical History:  Procedure Laterality Date  . ABDOMINAL HYSTERECTOMY    . APPENDECTOMY    . DIAPHRAGMATIC HERNIA REPAIR  2015  . [place]  . GALLBLADDER SURGERY  resection  . GASTRIC BYPASS    . GASTRIC BYPASS  2000, 2005   Dr.  Debroah Loop  . [facility] IMPLANT PLACEMENT  April 2013   [place]  . JOINT REPLACEMENT  2007   bilateral knee. [name],  [name]  . ROTATOR CUFF REPAIR     right  . [place]  . TOTAL ABDOMINAL HYSTERECTOMY W/ BILATERAL SALPINGOOPHORECTOMY  1974  . UMBILICAL HERNIA REPAIR  Aug 11, 2015    Family History  Problem Relation Age of Onset  . Heart disease Father   . Hypertension Father   . Prostate cancer Father   . Stroke Father   . Osteoporosis Father   . Stroke Mother   . Depression Mother   . Headache Mother   . Heart disease Mother   . Thyroid disease Mother   . Hypertension Mother   . Diabetes Daughter   . Heart disease Daughter   . Hypertension Daughter   . Hypertension Son     Social history:  reports that she has never smoked. She has never used smokeless tobacco. She reports that she does not drink alcohol or use drugs.    Allergies  Allergen Reactions  . Diovan [Valsartan]   . Erythromycin   . Flagyl [Metronidazole]   . Glucophage [Metformin Hcl]   . Xanax [Alprazolam]   . Amoxicillin Diarrhea  . Demeclocycline Hives  . Erythromycin Base   . Metformin   .  Sulfa Antibiotics Rash    As child  . Tetracyclines & Related Hives and Rash    Medications:  Prior to Admission medications   Medication Sig Start Date End Date Taking? Authorizing Provider  albuterol (PROAIR HFA) 108 (90 Base) MCG/ACT inhaler Inhale into the lungs.    Historical Provider, MD  alosetron (LOTRONEX) 1 MG tablet Take 1 tablet (1 mg total) by mouth 2 (two) times daily. 10/10/15   [name], MD  benzonatate (TESSALON) 200 MG capsule Take 1 capsule (200 mg total) by mouth 3 (three) times daily as needed for cough. 08/03/14   Rubbie Battiest, NP  Cholecalciferol (VITAMIN D3) 10000 UNITS capsule Take 10,000 Units by mouth once a week. Reported on 12/22/2015    Historical Provider, MD  ciprofloxacin (CIPRO) 250 MG tablet Take 1 tablet (250 mg total) by mouth 2 (two) times daily. 02/02/16    Burnard Hawthorne, FNP  cyanocobalamin (,VITAMIN B-12,) 1000 MCG/ML injection 1000 ml (1 ml) injection two times  monthly 12/13/15   [name], MD  diazepam (VALIUM) 10 MG tablet Take 1 tablet (10 mg total) by mouth at bedtime as needed for anxiety. 12/23/15   [name], MD  diphenoxylate-atropine (LOMOTIL) 2.5-0.025 MG tablet Take by mouth.    Historical Provider, MD  Eluxadoline 100 MG TABS Take by mouth. Reported on 12/13/2015    Historical Provider, MD  ergocalciferol (DRISDOL) 50000 units capsule Take by mouth. Reported on 12/13/2015 08/11/13   Historical Provider, MD  fluconazole (DIFLUCAN) 150 MG tablet Take 1 tablet (150 mg total) by mouth once. Take one tablet PO once. If continue to have symptoms, may take one tablet PO 3 days later. 02/02/16   Burnard Hawthorne, FNP  furosemide (LASIX) 20 MG tablet Take 1 tablet (20 mg total) by mouth daily. 02/14/16   [name], MD  hydrocodone-acetaminophen (LORCET-HD) 5-500 MG per capsule Take 1 capsule by mouth every 6 (six) hours as needed. 07/29/12   [name], MD  loperamide (IMODIUM A-D) 2 MG tablet Take by mouth.    Historical Provider, MD  LUNESTA 2 MG TABS tablet Take 1 tablet (2 mg total) by mouth at bedtime as needed for sleep. Take immediately before bedtime 02/08/16   [name], MD  mometasone (ELOCON) 0.1 % cream Apply 1 application topically daily. 12/13/15   [name], MD  mupirocin cream (BACTROBAN) 2 % Apply 1 application topically 3 (three) times daily. 02/02/16   Burnard Hawthorne, FNP  nebivolol (BYSTOLIC) 5 MG tablet Take 1 tablet (5 mg total) by mouth daily. 11/06/15   [name], MD  nystatin (MYCOSTATIN) powder Apply topically 2 (two) times daily. 08/03/14   Rubbie Battiest, NP  pregabalin (LYRICA) 50 MG capsule One capsule in the morning and two in the evening 02/14/16   [name], MD  rOPINIRole (REQUIP) 1 MG tablet Take 1 tablet (1 mg total) by mouth at bedtime. 11/06/15   [name], MD  sitaGLIPtin  (JANUVIA) 100 MG tablet Take 1 tablet (100 mg total) by mouth daily. 05/03/15   [name], MD  traMADol (ULTRAM) 50 MG tablet Take 1 tablet (50 mg total) by mouth 4 (four) times daily as needed. 02/14/16   [name], MD    ROS:  Out of a complete 14 system review of symptoms, the patient complains only of the following symptoms, and all other reviewed systems are negative.  Decreased activity, decreased appetite,  chills, fatigue, excessive sweating Difficulty swallowing Eye discharge, light sensitivity, double vision Cough, shortness of breath, choking swollen abdomen, abdominal pain, diarrhea Restless legs, insomnia, frequent waking, daytime sleepiness Incontinence of the bladder Joint pain, joint swelling, back pain, achy muscles, muscle cramps, walking difficulty, neck pain, neck stiffness Dizziness, memory loss, headache, weakness Confusion, decreased concentration   Blood pressure 124/65, pulse 75, height 5\' 3"  (1.6 m), weight 247 lb 8 oz (112.3 kg).  Physical Exam  General: The patient is alert and cooperative at the time of the examination. The patient is markedly obese.  Skin: No significant peripheral edema is noted.   Neurologic Exam  Mental status: The patient is alert and oriented x 3 at the time of the examination. The patient has apparent normal recent and remote memory, with an apparently normal attention span and concentration ability.   Cranial nerves: Facial symmetry is present. Speech is normal, no aphasia or dysarthria is noted. Extraocular movements are full. Visual fields are full.  Motor: The patient has good strength in all 4 extremities.  Sensory examination: Soft touch sensation is symmetric on the face, arms, and legs.  Coordination: The patient has good finger-nose-finger and heel-to-shin bilaterally.  Gait and station: The patient has a slightly wide-based, waddling gait. Tandem gait was not attempted. Romberg is negative.  Reflexes:  Deep tendon reflexes are symmetric, somewhat brisk in the arms, decreased in the legs.   Assessment/Plan:  1. Peripheral neuropathy  2. Gait disturbance  3. Diabetes  The patient will go down on the Lyrica dose taking 100 mg twice daily. The patient will be placed on Keppra taking 500 mg twice daily. If the Keppra seems to help, we may go off of the Lyrica completely to help improve the gait instability and the leg swelling. The patient will follow-up in 5 months.   Jill Alexanders MD 03/22/2016 3:15 PM  [facility] [address] [place], [place] [address]  Phone [phone] Fax [phone]

## 2016-03-28 ENCOUNTER — Ambulatory Visit (INDEPENDENT_AMBULATORY_CARE_PROVIDER_SITE_OTHER): Payer: Medicare Other | Admitting: Gastroenterology

## 2016-03-28 ENCOUNTER — Other Ambulatory Visit: Payer: Self-pay

## 2016-03-28 ENCOUNTER — Encounter: Payer: Self-pay | Admitting: Gastroenterology

## 2016-03-28 VITALS — BP 138/59 | HR 62 | Temp 98.2°F | Ht 63.0 in | Wt 243.0 lb

## 2016-03-28 DIAGNOSIS — R197 Diarrhea, unspecified: Secondary | ICD-10-CM | POA: Diagnosis not present

## 2016-03-29 DIAGNOSIS — R197 Diarrhea, unspecified: Secondary | ICD-10-CM | POA: Diagnosis not present

## 2016-03-29 NOTE — Progress Notes (Signed)
Primary Care Physician: [name], MD  Primary Gastroenterologist:  Dr. [name]  Chief Complaint  Patient presents with  . Diarrhea    HPI: Denise Sharp is a 70 y.o. female here for a history of chronic diarrhea.  The patient had a colonoscopy in the past with multiple biopsies taken without any cause for her diarrhea.  The patient states that she has upwards of 30 bowel movements a day. Despite this the patient reports that she continues to gain weight and not lose weight.  She does report that she has an adrenal lesion that is being followed by urology which makes her have high blood pressure.  She states that she is very frustrated with her diarrhea and  Wants something done.  Current Outpatient Prescriptions  Medication Sig Dispense Refill  . alosetron (LOTRONEX) 1 MG tablet Take 1 tablet (1 mg total) by mouth 2 (two) times daily. 60 tablet 11  . benzonatate (TESSALON) 200 MG capsule Take 1 capsule (200 mg total) by mouth 3 (three) times daily as needed for cough. 180 capsule 1  . Cholecalciferol (VITAMIN D3) 10000 UNITS capsule Take 10,000 Units by mouth once a week. Reported on 12/22/2015    . cyanocobalamin (,VITAMIN B-12,) 1000 MCG/ML injection 1000 ml (1 ml) injection two times  monthly 10 mL 3  . diazepam (VALIUM) 10 MG tablet Take 1 tablet (10 mg total) by mouth at bedtime as needed for anxiety. 21 tablet 0  . Eluxadoline 100 MG TABS Take by mouth. Reported on 12/13/2015    . Ergocalciferol (DRISDOL PO) Take 10,000 Units by mouth daily. Reported on 12/13/2015    . hydrocodone-acetaminophen (LORCET-HD) 5-500 MG per capsule Take 1 capsule by mouth every 6 (six) hours as needed. 30 capsule 3  . levETIRAcetam (KEPPRA) 500 MG tablet Take 1 tablet (500 mg total) by mouth 2 (two) times daily. 60 tablet 3  . loperamide (IMODIUM A-D) 2 MG tablet Take 2 mg by mouth 3 (three) times daily as needed.     Johnnye Sima 2 MG TABS tablet Take 1 tablet (2 mg total) by mouth at bedtime as  needed for sleep. Take immediately before bedtime 90 tablet 1  . mometasone (ELOCON) 0.1 % cream Apply 1 application topically daily. 45 g 0  . nebivolol (BYSTOLIC) 5 MG tablet Take 1 tablet (5 mg total) by mouth daily. 90 tablet 1  . nystatin (MYCOSTATIN) powder Apply topically 2 (two) times daily. 60 g 3  . sitaGLIPtin (JANUVIA) 100 MG tablet Take 1 tablet (100 mg total) by mouth daily. 90 tablet 2  . traMADol (ULTRAM) 50 MG tablet Take 1 tablet (50 mg total) by mouth 4 (four) times daily as needed. 30 tablet 4  . albuterol (PROAIR HFA) 108 (90 Base) MCG/ACT inhaler Inhale into the lungs.    . diphenoxylate-atropine (LOMOTIL) 2.5-0.025 MG tablet Take by mouth.    . furosemide (LASIX) 20 MG tablet Take 1 tablet (20 mg total) by mouth daily. (Patient not taking: Reported on 03/28/2016) 30 tablet 5  . pregabalin (LYRICA) 100 MG capsule Take 1 capsule (100 mg total) by mouth 2 (two) times daily. (Patient not taking: Reported on 03/28/2016)    . rOPINIRole (REQUIP) 1 MG tablet Take 1 tablet (1 mg total) by mouth at bedtime. (Patient not taking: Reported on 03/28/2016) 90 tablet 2   No current facility-administered medications for this visit.     Allergies as of 03/28/2016 - Review Complete 03/28/2016  Allergen Reaction Noted  .  Diovan [valsartan]  08/17/2011  . Erythromycin  08/17/2011  . Flagyl [metronidazole]  08/17/2011  . Glucophage [metformin hcl]  08/17/2011  . Xanax [alprazolam]  08/17/2011  . Amoxicillin Diarrhea 05/04/2014  . Demeclocycline Hives 09/18/2013  . Erythromycin base  11/23/2013  . Metformin  09/18/2013  . Sulfa antibiotics Rash 09/18/2013  . Tetracyclines & related Hives and Rash 03/12/2011    ROS:  General: Negative for anorexia, weight loss, fever, chills, fatigue, weakness. ENT: Negative for hoarseness, difficulty swallowing , nasal congestion. CV: Negative for chest pain, angina, palpitations, dyspnea on exertion, peripheral edema.  Respiratory: Negative for  dyspnea at rest, dyspnea on exertion, cough, sputum, wheezing.  GI: See history of present illness. GU:  Negative for dysuria, hematuria, urinary incontinence, urinary frequency, nocturnal urination.  Endo: Negative for unusual weight change.    Physical Examination:   BP (!) 138/59   Pulse 62   Temp 98.2 F (36.8 C) (Oral)   Ht 5\' 3"  (1.6 m)   Wt 243 lb (110.2 kg)   BMI 43.05 kg/m   General: Well-nourished, Morbidly obese, well-developed in no acute distress.  Eyes: No icterus. Conjunctivae pink. Mouth: Oropharyngeal mucosa moist and pink , no lesions erythema or exudate. Lungs: Clear to auscultation bilaterally. Non-labored. Heart: Regular rate and rhythm, no murmurs rubs or gallops.  Abdomen: Bowel sounds are normal, nontender, nondistended, no hepatosplenomegaly or masses, no abdominal bruits or hernia , no rebound or guarding.   Extremities: No lower extremity edema. No clubbing or deformities. Neuro: Alert and oriented x 3.  Grossly intact. Skin: Warm and dry, no jaundice.   Psych: Alert and cooperative, normal mood and affect.  Labs:    Imaging Studies: No results found.  Assessment and Plan:   Denise Sharp is a 70 y.o. y/o female with chronic diarrhea for many years.  The patient has had up to 30 bowel movements a day and states that she can't go out of the house.  The patient also has a history of irritable bowel syndrome.  The patient's random biopsies throughout the colon did not show any cause for her diarrhea.  The patient will have her blood sent off for vasoactive intestinal peptide.  She will also have her stool tests rechecked for possible pathogens.   If everything comes back negative the patient may need to be sent to a tertiary care center for evaluation. The patient and her husband have been explained the plan and agree with   Note: This dictation was prepared with Dragon dictation along with smaller phrase technology. Any transcriptional errors that  result from this process are unintentional.

## 2016-04-02 LAB — GI PROFILE, STOOL, PCR

## 2016-04-02 LAB — OSMOLALITY, STOOL

## 2016-04-03 ENCOUNTER — Emergency Department: Payer: Medicare Other

## 2016-04-03 ENCOUNTER — Encounter: Payer: Self-pay | Admitting: Emergency Medicine

## 2016-04-03 ENCOUNTER — Emergency Department
Admission: EM | Admit: 2016-04-03 | Discharge: 2016-04-04 | Disposition: A | Discharge status: Home / Self Care | Payer: Medicare Other | Attending: Emergency Medicine | Admitting: Emergency Medicine

## 2016-04-03 DIAGNOSIS — R110 Nausea: Secondary | ICD-10-CM | POA: Diagnosis not present

## 2016-04-03 DIAGNOSIS — R1084 Generalized abdominal pain: Secondary | ICD-10-CM | POA: Diagnosis not present

## 2016-04-03 DIAGNOSIS — Z79899 Other long term (current) drug therapy: Secondary | ICD-10-CM | POA: Insufficient documentation

## 2016-04-03 DIAGNOSIS — E119 Type 2 diabetes mellitus without complications: Secondary | ICD-10-CM | POA: Insufficient documentation

## 2016-04-03 DIAGNOSIS — E039 Hypothyroidism, unspecified: Secondary | ICD-10-CM | POA: Diagnosis not present

## 2016-04-03 DIAGNOSIS — R197 Diarrhea, unspecified: Secondary | ICD-10-CM | POA: Insufficient documentation

## 2016-04-03 DIAGNOSIS — I10 Essential (primary) hypertension: Secondary | ICD-10-CM | POA: Diagnosis not present

## 2016-04-03 DIAGNOSIS — R109 Unspecified abdominal pain: Secondary | ICD-10-CM | POA: Diagnosis not present

## 2016-04-03 DIAGNOSIS — N281 Cyst of kidney, acquired: Secondary | ICD-10-CM | POA: Diagnosis not present

## 2016-04-03 LAB — LIPASE, BLOOD: Lipase: 29 U/L (ref 11–51)

## 2016-04-03 LAB — CBC
HCT: 38.9 % (ref 35.0–47.0)
Hemoglobin: 13.1 g/dL (ref 12.0–16.0)
MCH: 29.8 pg (ref 26.0–34.0)
MCHC: 33.7 g/dL (ref 32.0–36.0)
MCV: 88.3 fL (ref 80.0–100.0)
Platelets: 251 10*3/uL (ref 150–440)
RBC: 4.41 MIL/uL (ref 3.80–5.20)
RDW: 15.5 % — ABNORMAL HIGH (ref 11.5–14.5)
WBC: 10.4 10*3/uL (ref 3.6–11.0)

## 2016-04-03 LAB — URINALYSIS COMPLETE WITH MICROSCOPIC (ARMC ONLY)
Bilirubin Urine: NEGATIVE
Glucose, UA: NEGATIVE mg/dL
Ketones, ur: NEGATIVE mg/dL
Leukocytes, UA: NEGATIVE
Nitrite: NEGATIVE
Protein, ur: NEGATIVE mg/dL
RBC / HPF: NONE SEEN RBC/hpf (ref 0–5)
Specific Gravity, Urine: 1.006 (ref 1.005–1.030)
pH: 5 (ref 5.0–8.0)

## 2016-04-03 LAB — COMPREHENSIVE METABOLIC PANEL
ALT: 15 U/L (ref 14–54)
AST: 19 U/L (ref 15–41)
Albumin: 3.8 g/dL (ref 3.5–5.0)
Alkaline Phosphatase: 136 U/L — ABNORMAL HIGH (ref 38–126)
Anion gap: 8 (ref 5–15)
BUN: 13 mg/dL (ref 6–20)
CO2: 25 mmol/L (ref 22–32)
Calcium: 9 mg/dL (ref 8.9–10.3)
Chloride: 107 mmol/L (ref 101–111)
Creatinine, Ser: 0.92 mg/dL (ref 0.44–1.00)
GFR calc Af Amer: >60 mL/min (ref >60)
GFR calc non Af Amer: >60 mL/min (ref >60)
Glucose, Bld: 102 mg/dL — ABNORMAL HIGH (ref 65–99)
Potassium: 4.1 mmol/L (ref 3.5–5.1)
Sodium: 140 mmol/L (ref 135–145)
Total Bilirubin: 0.5 mg/dL (ref 0.3–1.2)
Total Protein: 7.8 g/dL (ref 6.5–8.1)

## 2016-04-03 MED ORDER — SODIUM CHLORIDE 0.9 % IV BOLUS (SEPSIS)
1000.0000 mL | Freq: Once | INTRAVENOUS | Status: AC
  Administered 2016-04-03: 1000 mL via INTRAVENOUS

## 2016-04-03 MED ORDER — HYDROMORPHONE HCL 1 MG/ML IJ SOLN
1.0000 mg | Freq: Once | INTRAMUSCULAR | Status: AC
Start: 2016-04-03 — End: 2016-04-03
  Administered 2016-04-03: 1 mg via INTRAVENOUS
  Filled 2016-04-03: qty 1

## 2016-04-03 MED ORDER — SODIUM CHLORIDE 0.9 % IV BOLUS (SEPSIS)
1000.0000 mL | Freq: Once | INTRAVENOUS | Status: AC
  Administered 2016-04-04: 1000 mL via INTRAVENOUS

## 2016-04-03 MED ORDER — IOPAMIDOL (ISOVUE-300) INJECTION 61%
30.0000 mL | Freq: Once | INTRAVENOUS | Status: AC
  Administered 2016-04-03: 30 mL via ORAL

## 2016-04-03 MED ORDER — IOPAMIDOL (ISOVUE-300) INJECTION 61%
100.0000 mL | Freq: Once | INTRAVENOUS | Status: AC | PRN
  Administered 2016-04-03: 100 mL via INTRAVENOUS

## 2016-04-03 MED ORDER — ONDANSETRON HCL 4 MG/2ML IJ SOLN
4.0000 mg | Freq: Once | INTRAMUSCULAR | Status: AC
Start: 2016-04-03 — End: 2016-04-03
  Administered 2016-04-03: 4 mg via INTRAVENOUS
  Filled 2016-04-03: qty 2

## 2016-04-03 MED ORDER — ONDANSETRON HCL 4 MG PO TABS: 4.0000 mg | ORAL_TABLET | Freq: Three times a day (TID) | ORAL | 0 refills | Status: AC | PRN

## 2016-04-03 NOTE — ED Triage Notes (Addendum)
Pt to triage in wheelchair due to weakness related to severe diarrhea for 3 days. Pt has hx of irritable bowl syndrome and 2 gastric bypass surgeries. Pt reports she has had decrease in intake, vision changes, HA, chest pain, cough and weakness x3 days. Pt reports she was seen at her PCP on Friday for stool samples, pt has not received results.

## 2016-04-03 NOTE — ED Provider Notes (Signed)
[facility] Emergency Department Provider Note  ____________________________________________  Time seen: Approximately 8:15 PM  I have reviewed the triage vital signs and the nursing notes.   HISTORY  Chief Complaint Abdominal Pain    HPI Denise Sharp is a 70 y.o. female w/ a hx of IBS, s/p gastric bypass x 2, chronic abdominal pain presenting with 3 days of diarrhea and worsening abdominal pain. The patient reports that she has had up to 30 watery bowel movements today alone. She describes a "doubling over" abdominal pain that is worst just to the right of the umbilicus. This location as where she typically has her chronic abdominal pain. She has had nausea without vomiting. She has been able to tolerate liquid. No fever or chills, urinary symptoms.No recent antibiotic use, no known sick contacts.   Past Medical History:  Diagnosis Date  . Abnormality of gait 03/25/2013  . Adrenal mass ([place])   . Adrenal mass, left ([place])   . Anemia    iron deficient post  2 unit txfsn 2009, normal endo/colonoscopy by [facility]  . Apnea   . Arthritis   . Cervical spinal stenosis 1994   due to trauma to back ([employer] accident), has intermittent paralysis and parasthesias  . Cervicogenic headache 03/23/2014  . Depression   . Diabetes mellitus   . Dizziness    chronic dizziness  . Esophageal stenosis March 2011   with transietn outlet obstruction by food, cleared by EGD   . Headache(784.0)   . Hypertension   . IBS (irritable bowel syndrome)   . LBBB (left bundle branch block)   . Obesity   . Obstructive sleep apnea    using CPAP  . 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 Restless leg syndrome   . Syncope and collapse 03/12/2014    Patient Active Problem List   Diagnosis Date Noted  . Edema 12/13/2015  . Pain and swelling of right lower leg 12/02/2015  . Hypersomnia, persistent 06/18/2015  . Altered mental status 12/07/2014  . Hypothyroidism 10/31/2014  .  Candida rash of groin 10/31/2014  . Mechanical complication of nervous system device ([place]) 10/16/2014  . Obesity hypoventilation syndrome ([place]) 08/13/2014  . Frequent falls 06/23/2014  . Chronic cough 05/05/2014  . Adenoma of left adrenal gland 03/24/2014  . Cervicogenic headache 03/23/2014  . Syncope and collapse 03/12/2014  . Unspecified vitamin D deficiency 01/06/2014  . Abdominal apron 12/23/2013  . Abdominal pain, acute, right lower quadrant 12/03/2013  . Rectal bleeding 12/03/2013  . Weight gain 12/03/2013  . Headache(784.0) 11/23/2013  . Obesity 08/25/2013  . Abdominal discomfort in right lower quadrant 08/04/2013  . Diaphragmatic hernia 04/06/2013  . Diabetic polyneuropathy ([place]) 03/25/2013  . Abnormality of gait 03/25/2013  . Multiple pulmonary nodules 03/20/2013  . SOB (shortness of breath) 03/20/2013  . Anemia 08/06/2012  . Iron deficiency anemia 08/06/2012  . Disorder of bladder function 07/15/2012  . Incomplete bladder emptying 07/15/2012  . 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 Urge incontinence of urine 07/15/2012  . FOM (frequency of micturition) 07/15/2012  . OSA on CPAP 03/02/2012  . Insomnia secondary to anxiety 02/29/2012  . Bundle branch block, left 01/07/2012  . Sciatica 09/05/2011  . Cervical stenosis of spinal canal 06/14/2011  . Restless legs syndrome 03/13/2011  . Degenerative disk disease 03/13/2011  . Hypertension 03/12/2011  . 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 Hearing loss, right 03/12/2011  Past Surgical History:  Procedure Laterality Date  . ABDOMINAL HYSTERECTOMY    . APPENDECTOMY    . DIAPHRAGMATIC HERNIA REPAIR  2015  . [place]  . GALLBLADDER SURGERY  resection  . GASTRIC BYPASS    . GASTRIC BYPASS  2000, 2005   Dr. Debroah Loop  . [facility] IMPLANT PLACEMENT  April 2013   [place]  . JOINT REPLACEMENT  2007    bilateral knee. [name],  [name]  . ROTATOR CUFF REPAIR     right  . [place]  . TOTAL ABDOMINAL HYSTERECTOMY W/ BILATERAL SALPINGOOPHORECTOMY  1974  . UMBILICAL HERNIA REPAIR  Aug 11, 2015    Current Outpatient Rx  . Order #: [id number] Class: Historical Med  . Order #: [id number] Class: Print  . Order #: [id number] Class: Normal  . Order #: [id number] Class: Historical Med  . Order #: [id number] Class: Normal  . Order #: [id number] Class: Print  . Order #: [id number] Class: Historical Med  . Order #: [id number] Class: Historical Med  . Order #: [id number] Class: Historical Med  . Order #: [id number] Class: Normal  . Order #: [id number] Class: Print  . Order #: [id number] Class: Normal  . Order #: [id number] Class: Historical Med  . Order #: [id number] Class: Print  . Order #: [id number] Class: Normal  . Order #: [id number] Class: Normal  . Order #: [id number] Class: Normal  . Order #: [id number] Class: Print  . Order #: [id number] Class: No Print  . Order #: [id number] Class: Print  . Order #: [id number] Class: Normal  . Order #: [id number] Class: Print    Allergies Diovan [valsartan]; Erythromycin; Flagyl [metronidazole]; Glucophage [metformin hcl]; Xanax [alprazolam]; Amoxicillin; Demeclocycline; Erythromycin base; Metformin; Sulfa antibiotics; and Tetracyclines & related  Family History  Problem Relation Age of Onset  . Heart disease Father   . Hypertension Father   . Prostate cancer Father   . Stroke Father   . Osteoporosis Father   . Stroke Mother   . Depression Mother   . Headache Mother   . Heart disease Mother   . Thyroid disease Mother   . Hypertension Mother   . Diabetes Daughter   . Heart disease Daughter   . Hypertension Daughter   . Hypertension Son     Social History Social History  Substance Use Topics  . Smoking status: Never Smoker  . Smokeless tobacco: Never Used  . Alcohol use No    Review of Systems Constitutional: No fever/chills.No lightheadedness or  syncope. Eyes: No visual changes. ENT: No sore throat. No congestion or rhinorrhea. Cardiovascular: Denies chest pain. Denies palpitations. Respiratory: Denies shortness of breath.  No cough. Gastrointestinal: Positive diffuse abdominal pain.  Positive nausea, no vomiting.  Positive diarrhea.  No constipation. Genitourinary: Negative for dysuria. Musculoskeletal: Negative for back pain. Skin: Negative for rash. Neurological: Negative for headaches. No focal numbness, tingling or weakness.   10-point ROS otherwise negative.  ____________________________________________   PHYSICAL EXAM:  VITAL SIGNS: ED Triage Vitals  Enc Vitals Group     BP 04/03/16 1952 134/79     Pulse Rate 04/03/16 1952 (!) 55     Resp 04/03/16 1952 15     Temp 04/03/16 1952 98.5 F (36.9 C)     Temp Source 04/03/16 1952 Oral     SpO2 04/03/16 1952 99 %     Weight 04/03/16 1952 245 lb (111.1 kg)     Height 04/03/16 1952 5\' 4"  (1.626 m)     Head Circumference --      Peak Flow --  Pain Score 04/03/16 1953 6     Pain Loc --      Pain Edu? --      Excl. in [place]? --     Constitutional: Alert and oriented. Well appearing and in no acute distress. Answers questions appropriately. Eyes: Conjunctivae are normal.  EOMI. No scleral icterus. Head: Atraumatic. Nose: No congestion/rhinnorhea. Mouth/Throat: Mucous membranes are Dry.  Neck: No stridor.  Supple.  No meningismus. Cardiovascular: Normal rate, regular rhythm. No murmurs, rubs or gallops.  Respiratory: Normal respiratory effort.  No accessory muscle use or retractions. Lungs CTAB.  No wheezes, rales or ronchi. Gastrointestinal: Morbidly obese, soft and nondistended. Diffuse tenderness to palpation in every quadrant with increased pain with palpation laterally and inferiorly to the umbilicus on the right side. No guarding or rebound.  No peritoneal signs. Musculoskeletal: No LE edema. No ttp in the calves or palpable cords.  Negative Homan's  sign. Neurologic:  A&Ox3.  Speech is clear.  Face and smile are symmetric.  EOMI.  Moves all extremities well. Skin:  Skin is warm, dry and intact. No rash noted. Psychiatric: Mood and affect are normal. Speech and behavior are normal.  Normal judgement.  ____________________________________________   LABS (all labs ordered are listed, but only abnormal results are displayed)  Labs Reviewed  COMPREHENSIVE METABOLIC PANEL - Abnormal; Notable for the following:       Result Value   Glucose, Bld 102 (*)    Alkaline Phosphatase 136 (*)    All other components within normal limits  CBC - Abnormal; Notable for the following:    RDW 15.5 (*)    All other components within normal limits  C DIFFICILE QUICK SCREEN W PCR REFLEX  GASTROINTESTINAL PANEL BY PCR, STOOL (REPLACES STOOL CULTURE)  LIPASE, BLOOD  URINALYSIS COMPLETEWITH MICROSCOPIC ([facility] ONLY)   ____________________________________________  EKG  ED ECG REPORT I, Eula Listen, the attending physician, personally viewed and interpreted this ECG.   Date: 04/03/2016  EKG Time: 2009  Rate: 59  Rhythm: normal sinus rhythm  Axis: Normal  Intervals:none  ST&T Change: No ST elevation.  ____________________________________________  RADIOLOGY  Ct Abdomen Pelvis W Contrast  Result Date: 04/03/2016 CLINICAL DATA:  Acute onset of generalized weakness and severe diarrhea. Initial encounter. EXAM: CT ABDOMEN AND PELVIS WITH CONTRAST TECHNIQUE: Multidetector CT imaging of the abdomen and pelvis was performed using the standard protocol following bolus administration of intravenous contrast. CONTRAST:  144mL ISOVUE-300 IOPAMIDOL (ISOVUE-300) INJECTION 61% COMPARISON:  CT of the abdomen and pelvis from 12/03/2014 FINDINGS: Lower chest: Minimal bibasilar atelectasis is noted. Mild calcification is noted at the mitral valve. Hepatobiliary: The liver is unremarkable in appearance. The patient is status post cholecystectomy, with  clips noted at the gallbladder fossa. The common bile duct remains normal in caliber. Pancreas: The pancreas is within normal limits. Spleen: The spleen is unremarkable in appearance. Adrenals/Urinary Tract: The adrenal glands are unremarkable in appearance. Bilateral parapelvic renal cysts are noted. There is no evidence of hydronephrosis. No renal or ureteral stones are identified. No perinephric stranding is seen. Stomach/Bowel: The patient is status post gastric bypass surgery with gastrojejunostomy. The gastrojejunostomy is unremarkable in appearance. The distal stomach contains a small amount of fluid. The small bowel is within normal limits. The patient is status post appendectomy. The colon is unremarkable in appearance. Vascular/Lymphatic: Mild scattered calcification is seen along the abdominal aorta and its branches. The abdominal aorta is otherwise grossly unremarkable. The inferior vena cava is grossly unremarkable. No retroperitoneal lymphadenopathy is  seen. No pelvic sidewall lymphadenopathy is identified. Reproductive: The bladder is mildly distended and grossly unremarkable. The patient is status post hysterectomy. No suspicious adnexal masses are seen. Other: A metallic device is noted at the left flank, with a single lead ending at the right hemipelvis. Postoperative change is noted along the anterior midline abdominal wall, with mild associated calcification. Musculoskeletal: No acute osseous abnormalities are identified. Vacuum phenomenon and endplate sclerotic change are noted at L3-L4 and L4-L5. The visualized musculature is unremarkable in appearance. IMPRESSION: 1. No acute abnormality seen to explain the patient's symptoms. 2. Gastrojejunostomy is unremarkable in appearance. 3. Mild scattered calcification along the abdominal aorta and its branches. 4. Bilateral parapelvic renal cysts noted. Electronically Signed   By: Garald Balding M.D.   On: 04/03/2016 22:20     ____________________________________________   PROCEDURES  Procedure(s) performed: None  Procedures  Critical Care performed: No ____________________________________________   INITIAL IMPRESSION / ASSESSMENT AND PLAN / ED COURSE  Pertinent labs & imaging results that were available during my care of the patient were reviewed by me and considered in my medical decision making (see chart for details).  70 y.o. female with a history of gastric bypass and IBS presenting with 3 days of watery diarrhea with associated abdominal pain. Overall, the patient has stable vital signs. We'll get a CT of the abdomen to evaluate for any intra-abdominal infection. This may be an IBS flare, or a viral or bacterial GI illness. I would also consider foodborne illness. We will get stool studies including Clostridium difficile although the patient has not been on any antibiotics recently. Final disposition after evaluation.  ____________________________________________  FINAL CLINICAL IMPRESSION(S) / ED DIAGNOSES  Final diagnoses:  Watery diarrhea  Diffuse abdominal pain  Nausea without vomiting    Clinical Course  Comment By Time  The patient has continued to have intermittent episodes of watery nonbloody diarrhea here, but she has normal vital signs, reassuring blood work, and a CT scan does not show any acute process. Her stool studies are pending and Dr. Archie Balboa will follow up the results. Final disposition per Dr. Archie Balboa. Eula Listen, MD 09/19 2329      NEW MEDICATIONS STARTED DURING THIS VISIT:  New Prescriptions   ONDANSETRON (ZOFRAN) 4 MG TABLET    Take 1 tablet (4 mg total) by mouth every 8 (eight) hours as needed for nausea or vomiting.      Eula Listen, MD 04/03/16 2330

## 2016-04-03 NOTE — Discharge Instructions (Signed)
You may continue your antidiarrheal medications as prescribed. You may take Zofran for nausea and vomiting. You may follow a bland BRAT diet to decrease diarrhea.  Make a follow-up appointment with Dr. Allen Norris, your GI doctor.  Return to the emergency department if you develop severe pain, fever, inability to keep down fluids, lightheadedness or fainting, or any other symptoms concerning to you.

## 2016-04-04 ENCOUNTER — Telehealth: Payer: Self-pay

## 2016-04-04 DIAGNOSIS — R1084 Generalized abdominal pain: Secondary | ICD-10-CM | POA: Diagnosis not present

## 2016-04-04 LAB — GASTROINTESTINAL PANEL BY PCR, STOOL (REPLACES STOOL CULTURE)

## 2016-04-04 LAB — C DIFFICILE QUICK SCREEN W PCR REFLEX
C Diff antigen: NEGATIVE
C Diff interpretation: NOT DETECTED
C Diff toxin: NEGATIVE

## 2016-04-04 LAB — VASOACTIVE INTESTINAL PEPTIDE (VIP): Vasoactive Intest Polypeptide: 20.1 pg/mL (ref 0.0–58.8)

## 2016-04-04 NOTE — Telephone Encounter (Signed)
Pt notified of lab results

## 2016-04-04 NOTE — Telephone Encounter (Signed)
-----   Message from [name], MD sent at 04/03/2016 11:50 AM EDT ----- The patient know that the stool did not show any pathogens that would be causing her symptoms. The stool asthma laterality could not be done because it needed to be in a different sample. Find out if the patient is repeating that test.

## 2016-04-05 ENCOUNTER — Other Ambulatory Visit: Payer: Self-pay

## 2016-04-05 ENCOUNTER — Telehealth: Payer: Self-pay

## 2016-04-05 LAB — URINE CULTURE

## 2016-04-05 NOTE — Telephone Encounter (Signed)
-----   Message from [name], MD sent at 04/05/2016  7:49 AM EDT ----- Let the patient know that the blood test was negative. Refer her to [facility] for her IBS.

## 2016-04-05 NOTE — Telephone Encounter (Signed)
Pt has been referred to [facility]. Notes, labs, procedures and imaging has been faxed to [phone]. Pt will be contacted by scheduler to set up appt. Pt is aware this referral is being done.

## 2016-04-05 NOTE — Telephone Encounter (Signed)
Pt has been advised of lab results.

## 2016-04-09 ENCOUNTER — Telehealth: Payer: Self-pay

## 2016-04-09 NOTE — Telephone Encounter (Signed)
Patient is calling because she hasn't received a call from the nurse yet about where they are going to refer her to. She is Dr. Lynnell Jude patient. I read the notes and let her know that her referral was sent to [facility] and that they will be contacting her to schedule her an appointment. She told me that her phone is giving her problems and provided me with her husbands cell phone number [phone]. She said that she can be contacted at her home number if they leave her a message [phone]. I gave her the number to [facility] gastro[facility] [phone] and let her know to contact them. Patient understood.

## 2016-04-23 DIAGNOSIS — H10023 Other mucopurulent conjunctivitis, bilateral: Secondary | ICD-10-CM | POA: Diagnosis not present

## 2016-05-09 ENCOUNTER — Ambulatory Visit (INDEPENDENT_AMBULATORY_CARE_PROVIDER_SITE_OTHER): Payer: Medicare Other | Admitting: Podiatry

## 2016-05-09 VITALS — BP 128/74 | HR 57 | Resp 16

## 2016-05-09 DIAGNOSIS — L97529 Non-pressure chronic ulcer of other part of left foot with unspecified severity: Secondary | ICD-10-CM

## 2016-05-09 DIAGNOSIS — E11621 Type 2 diabetes mellitus with foot ulcer: Secondary | ICD-10-CM | POA: Diagnosis not present

## 2016-05-09 DIAGNOSIS — L84 Corns and callosities: Secondary | ICD-10-CM

## 2016-05-09 DIAGNOSIS — M79676 Pain in unspecified toe(s): Secondary | ICD-10-CM | POA: Diagnosis not present

## 2016-05-09 DIAGNOSIS — B351 Tinea unguium: Secondary | ICD-10-CM

## 2016-05-09 DIAGNOSIS — E1149 Type 2 diabetes mellitus with other diabetic neurological complication: Secondary | ICD-10-CM

## 2016-05-09 NOTE — Progress Notes (Signed)
She presents today for follow-up of painful elongated toenails and painful diabetic feet with painful neuropathy bilateral. She also has a history of diabetic peripheral vascular disease. Chronic history of arthritic deformity as well as a history of chronic ulceration.  Objective: Vital signs are stable she is alert and oriented 3 pulses are nonpalpable capillary fill time is immediate. Warm feet are noted bilateral. No open lesions or wounds at this point. Toenails are long thick yellow dystrophic and clinically mycotic.  Assessment: Pain in limb secondary to onychomycosis diabetic peripheral neuropathy. Hammertoe deformities area chronic ulceration.  Plan: Debrided all reactive hyperkeratotic tissue debrided toenails 1 through 5 bilateral. Also wrote a prescription for her to go to an orthotist for shoe gear fabrication.

## 2016-05-11 ENCOUNTER — Telehealth: Payer: Self-pay | Admitting: *Deleted

## 2016-05-11 ENCOUNTER — Telehealth: Payer: Self-pay | Admitting: Internal Medicine

## 2016-05-11 MED ORDER — AMLODIPINE BESYLATE 2.5 MG PO TABS: 2.5000 mg | ORAL_TABLET | Freq: Every day | ORAL | 3 refills | Status: DC

## 2016-05-11 NOTE — Telephone Encounter (Signed)
Noted awaiting [employer] note, thanks

## 2016-05-11 NOTE — Telephone Encounter (Addendum)
Please tell her that I have called  in amlodipine 2.5 mg to take daily for BP , she is currently not taking any BP meds.   She can increase the dose to 5 mg if needed

## 2016-05-11 NOTE — Telephone Encounter (Signed)
[facility] Patient Name: GETSEMANI LINDON DOB: 04-12-1946 Initial Comment Caller states evaluated BP 162/102 ranging in that area for the last 3 days. Taking meds dailey. Having headache and dizziness. Also having stomach pain. Just feeling bad. Nurse Assessment Nurse: Germain Osgood, RN, Opal Sidles Date/Time [name] Time): 05/11/2016 4:03:57 PM Confirm and document reason for call. If symptomatic, describe symptoms. You must click the next button to save text entered. ---Caller states evaluated BP 162/102 ranging in that area for the last 3 days. Taking meds daily. Having headache and dizziness. Also having stomach pain. Just feeling bad. Hernia Surgery in January. HX heart catheterization. Has mass left adrenal gland. Yesterday 163/101. In bed all day Heart rate 93. Bystalic 1 tab daily. Took this a.m. Has the patient traveled out of the country within the last 30 days? ---No Does the patient have any new or worsening symptoms? ---Yes Will a triage be completed? ---Yes Related visit to physician within the last 2 weeks? ---No Does the PT have any chronic conditions? (i.e. diabetes, asthma, etc.) ---Yes List chronic conditions. 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 ---No Guidelines Guideline Title Affirmed Question Affirmed Notes High Blood Pressure [1] BP # 160 / 100 AND [2] cardiac or neurologic symptoms (e.g., chest pain, difficulty breathing, unsteady gait, blurred vision) Final Disposition User Go to ED Now Germain Osgood, RN, Opal Sidles Comments Caller reports she is going to go home and recheck her BP. If down will not come in. If remains up will come in Called office back line to report patient refusal to go to ED Referrals GO TO FACILITY UNDECIDED Disagree/Comply: Disagree Disagree/Comply Reason: Wait and see

## 2016-05-11 NOTE — Telephone Encounter (Signed)
FYI Pt scheduled with Mable Paris on  Monday

## 2016-05-11 NOTE — Addendum Note (Signed)
Addended by: [name] on: 05/11/2016 05:02 PM   Modules accepted: Orders

## 2016-05-11 NOTE — Telephone Encounter (Signed)
Noted thanks °

## 2016-05-11 NOTE — Telephone Encounter (Signed)
Nurse Line reported that pt refused advised instruction for the ER  Pt contact [phone]-  * will call pt to schedule appt

## 2016-05-11 NOTE — Telephone Encounter (Signed)
Patient report  elevated blood pressure ranging around 162/102 for three days.  * Patient was transferred to the Nurse Line

## 2016-05-14 ENCOUNTER — Ambulatory Visit: Payer: Medicare Other | Admitting: Family

## 2016-05-14 ENCOUNTER — Encounter: Payer: Self-pay | Admitting: Family

## 2016-05-14 ENCOUNTER — Ambulatory Visit (INDEPENDENT_AMBULATORY_CARE_PROVIDER_SITE_OTHER): Payer: Medicare Other | Admitting: Family

## 2016-05-14 VITALS — BP 138/68 | HR 60 | Temp 98.6°F | Wt 238.4 lb

## 2016-05-14 DIAGNOSIS — R159 Full incontinence of feces: Secondary | ICD-10-CM | POA: Diagnosis not present

## 2016-05-14 DIAGNOSIS — I10 Essential (primary) hypertension: Secondary | ICD-10-CM | POA: Diagnosis not present

## 2016-05-14 DIAGNOSIS — Z6841 Body Mass Index (BMI) 40.0 and over, adult: Secondary | ICD-10-CM | POA: Diagnosis not present

## 2016-05-14 DIAGNOSIS — K529 Noninfective gastroenteritis and colitis, unspecified: Secondary | ICD-10-CM | POA: Diagnosis not present

## 2016-05-14 MED ORDER — AMLODIPINE BESYLATE 2.5 MG PO TABS: 2.5000 mg | ORAL_TABLET | Freq: Every day | ORAL | 0 refills | Status: DC

## 2016-05-14 NOTE — Patient Instructions (Addendum)
Trial of amlodipine by itself.  Hold bystolic. Concern for you getting more dizzy.   Keep BP log.   If there is no improvement in your symptoms, or if there is any worsening of symptoms, or if you have any additional concerns, please return for re-evaluation; or, if we are closed, consider going to the Emergency Room for evaluation if symptoms urgent.  Managing Your High Blood Pressure Blood pressure is a measurement of how forceful your blood is pressing against the walls of the arteries. Arteries are muscular tubes within the circulatory system. Blood pressure does not stay the same. Blood pressure rises when you are active, excited, or nervous; and it lowers during sleep and relaxation. If the numbers measuring your blood pressure stay above normal most of the time, you are at risk for health problems. High blood pressure (hypertension) is a long-term (chronic) condition in which blood pressure is elevated. A blood pressure reading is recorded as two numbers, such as 120 over 80 (or 120/80). The first, higher number is called the systolic pressure. It is a measure of the pressure in your arteries as the heart beats. The second, lower number is called the diastolic pressure. It is a measure of the pressure in your arteries as the heart relaxes between beats.  Keeping your blood pressure in a normal range is important to your overall health and prevention of health problems, such as heart disease and stroke. When your blood pressure is uncontrolled, your heart has to work harder than normal. High blood pressure is a very common condition in adults because blood pressure tends to rise with age. Men and women are equally likely to have hypertension but at different times in life. Before age 40, men are more likely to have hypertension. After 70 years of age, women are more likely to have it. Hypertension is especially common in African Americans. This condition often has no signs or symptoms. The cause of  the condition is usually not known. Your caregiver can help you come up with a plan to keep your blood pressure in a normal, healthy range. BLOOD PRESSURE STAGES Blood pressure is classified into four stages: normal, prehypertension, stage 1, and stage 2. Your blood pressure reading will be used to determine what type of treatment, if any, is necessary. Appropriate treatment options are tied to these four stages:  Normal  Systolic pressure (mm Hg): below 120.  Diastolic pressure (mm Hg): below 80. Prehypertension  Systolic pressure (mm Hg): 120 to 139.  Diastolic pressure (mm Hg): 80 to 89. Stage1  Systolic pressure (mm Hg): 140 to 159.  Diastolic pressure (mm Hg): 90 to 99. Stage2  Systolic pressure (mm Hg): 160 or above.  Diastolic pressure (mm Hg): 100 or above. RISKS RELATED TO HIGH BLOOD PRESSURE Managing your blood pressure is an important responsibility. Uncontrolled high blood pressure can lead to:  A heart attack.  A stroke.  A weakened blood vessel (aneurysm).  Heart failure.  Kidney damage.  Eye damage.  Metabolic syndrome.  Memory and concentration problems. HOW TO MANAGE YOUR BLOOD PRESSURE Blood pressure can be managed effectively with lifestyle changes and medicines (if needed). Your caregiver will help you come up with a plan to bring your blood pressure within a normal range. Your plan should include the following: Education  Read all information provided by your caregivers about how to control blood pressure.  Educate yourself on the latest guidelines and treatment recommendations. New research is always being done to further define the  risks and treatments for high blood pressure. Lifestylechanges  Control your weight.  Avoid smoking.  Stay physically active.  Reduce the amount of salt in your diet.  Reduce stress.  Control any chronic conditions, such as high cholesterol or diabetes.  Reduce your alcohol  intake. Medicines  Several medicines (antihypertensive medicines) are available, if needed, to bring blood pressure within a normal range. Communication  Review all the medicines you take with your caregiver because there may be side effects or interactions.  Talk with your caregiver about your diet, exercise habits, and other lifestyle factors that may be contributing to high blood pressure.  See your caregiver regularly. Your caregiver can help you create and adjust your plan for managing high blood pressure. RECOMMENDATIONS FOR TREATMENT AND FOLLOW-UP  The following recommendations are based on current guidelines for managing high blood pressure in nonpregnant adults. Use these recommendations to identify the proper follow-up period or treatment option based on your blood pressure reading. You can discuss these options with your caregiver.  Systolic pressure of 115 to 726 or diastolic pressure of 80 to 89: Follow up with your caregiver as directed.  Systolic pressure of 203 to 559 or diastolic pressure of 90 to 100: Follow up with your caregiver within 2 months.  Systolic pressure above 741 or diastolic pressure above 638: Follow up with your caregiver within 1 month.  Systolic pressure above 453 or diastolic pressure above 646: Consider antihypertensive therapy; follow up with your caregiver within 1 week.  Systolic pressure above 803 or diastolic pressure above 212: Begin antihypertensive therapy; follow up with your caregiver within 1 week.   This information is not intended to replace advice given to you by your health care provider. Make sure you discuss any questions you have with your health care provider.   Document Released: 03/26/2012 Document Reviewed: 03/26/2012 Elsevier Interactive Patient Education [employer].

## 2016-05-14 NOTE — Progress Notes (Signed)
Pre visit review using our clinic review tool, if applicable. No additional management support is needed unless otherwise documented below in the visit note. 

## 2016-05-14 NOTE — Assessment & Plan Note (Addendum)
Blood pressure well controlled. Heart rate 60. Patient has not taken bystolic today. I'm reassured by normal neurologic exam. Patient jointly agreed to trial amlodipine 2.5 mg which PCP prescribed on Friday on its own due to patient's heart rate being 60. She will keep a blood pressure log. She will follow-up in one week or sooner blood pressure elevates greater than 140/90. Return precautions.

## 2016-05-14 NOTE — Progress Notes (Signed)
Subjective:    Patient ID: Denise Sharp, female    DOB: [birth year], 70 y.o.   MRN: [id number]  CC: Denise Sharp is a 70 y.o. female who presents today for an acute visit.    HPI: Here for BP issues last 6 days has been running 150/94. Highest 170/104.  PCP called in amlodipine 2.5 mg however patient was unaware. Hasn't taken bystolic today.   She is complaint Bystolic and only takes if she has a HA when she gets up or if her BP is greater than 130. Describes occasional dizziness from bystolic.  Complains of dizziness, HA today. Not worse headache of life. No syncope.   Denies exertional chest pain or pressure, numbness or tingling radiating to left arm or jaw, palpitations, changes in vision, or shortness of breath.   Following with urology for mass on adrenal gland; started bystolic due to mass per patient.   Also follows with Dr. Fletcher Anon, cardiologist. Stress test per patient 07/2015 and was normal. Unable to see records.      HISTORY:  Past Medical History:  Diagnosis Date  . Abnormality of gait 03/25/2013  . Adrenal mass ([place])   . Adrenal mass, left ([place])   . Anemia    iron deficient post  2 unit txfsn 2009, normal endo/colonoscopy by [facility]  . Apnea   . Arthritis   . Cervical spinal stenosis 1994   due to trauma to back ([employer] accident), has intermittent paralysis and parasthesias  . Cervicogenic headache 03/23/2014  . Depression   . Diabetes mellitus   . Dizziness    chronic dizziness  . Esophageal stenosis March 2011   with transietn outlet obstruction by food, cleared by EGD   . Headache(784.0)   . Hypertension   . IBS (irritable bowel syndrome)   . LBBB (left bundle branch block)   . Obesity   . Obstructive sleep apnea    using CPAP  . 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 Restless leg syndrome   . Syncope and collapse 03/12/2014   Past Surgical History:  Procedure Laterality Date  . ABDOMINAL HYSTERECTOMY    . APPENDECTOMY    . DIAPHRAGMATIC  HERNIA REPAIR  2015  . [place]  . GALLBLADDER SURGERY  resection  . GASTRIC BYPASS    . GASTRIC BYPASS  2000, 2005   Dr. Debroah Loop  . [facility] IMPLANT PLACEMENT  April 2013   [place]  . JOINT REPLACEMENT  2007   bilateral knee. [name],  [name]  . ROTATOR CUFF REPAIR     right  . [place]  . TOTAL ABDOMINAL HYSTERECTOMY W/ BILATERAL SALPINGOOPHORECTOMY  1974  . UMBILICAL HERNIA REPAIR  Aug 11, 2015   Family History  Problem Relation Age of Onset  . Heart disease Father   . Hypertension Father   . Prostate cancer Father   . Stroke Father   . Osteoporosis Father   . Stroke Mother   . Depression Mother   . Headache Mother   . Heart disease Mother   . Thyroid disease Mother   . Hypertension Mother   . Diabetes Daughter   . Heart disease Daughter   . Hypertension Daughter   . Hypertension Son     Allergies: Diovan [valsartan]; Erythromycin; Flagyl [metronidazole]; Glucophage [metformin hcl]; Xanax [alprazolam]; Amoxicillin; Demeclocycline; Erythromycin base; Metformin; Sulfa antibiotics; and Tetracyclines & related Current Outpatient Prescriptions on File Prior to Visit  Medication Sig Dispense Refill  . levETIRAcetam (  KEPPRA) 500 MG tablet Take 1 tablet (500 mg total) by mouth 2 (two) times daily. 60 tablet 3  . LUNESTA 2 MG TABS tablet Take 1 tablet (2 mg total) by mouth at bedtime as needed for sleep. Take immediately before bedtime 90 tablet 1  . ondansetron (ZOFRAN) 4 MG tablet Take 1 tablet (4 mg total) by mouth every 8 (eight) hours as needed for nausea or vomiting. 15 tablet 0  . pregabalin (LYRICA) 100 MG capsule Take 1 capsule (100 mg total) by mouth 2 (two) times daily.    [name] rOPINIRole (REQUIP) 1 MG tablet Take 1 tablet (1 mg total) by mouth at bedtime. 90 tablet 2  . traMADol (ULTRAM) 50 MG tablet Take 1 tablet (50 mg total) by mouth 4 (four) times daily as needed. 30 tablet 4   No current facility-administered medications on  file prior to visit.     Social History  Substance Use Topics  . Smoking status: Never Smoker  . Smokeless tobacco: Never Used  . Alcohol use No    Review of Systems  Constitutional: Negative for chills and fever.  Respiratory: Negative for cough.   Cardiovascular: Negative for chest pain and palpitations.  Gastrointestinal: Negative for nausea and vomiting.      Objective:    BP 138/68   Pulse 60   Temp 98.6 F (37 C) (Oral)   Wt 238 lb 6.4 oz (108.1 kg)   SpO2 97%   BMI 40.92 kg/m    Physical Exam  Constitutional: She appears well-developed and well-nourished.  HENT:  Mouth/Throat: Uvula is midline, oropharynx is clear and moist and mucous membranes are normal.  Eyes: Conjunctivae and EOM are normal. Pupils are equal, round, and reactive to light.  Fundus normal bilaterally.   Cardiovascular: Normal rate, regular rhythm, normal heart sounds and normal pulses.   Pulmonary/Chest: Effort normal and breath sounds normal. She has no wheezes. She has no rhonchi. She has no rales.  Neurological: She is alert. She has normal strength. No cranial nerve deficit or sensory deficit. She displays a negative Romberg sign.  Reflex Scores:      Bicep reflexes are 2+ on the right side and 2+ on the left side.      Patellar reflexes are 2+ on the right side and 2+ on the left side. Grip equal and strong bilateral upper extremities. Gait strong and steady. No dizziness when ascending exam table.   Skin: Skin is warm and dry.  Psychiatric: She has a normal mood and affect. Her speech is normal and behavior is normal. Thought content normal.  Vitals reviewed.      Assessment & Plan:   Problem List Items Addressed This Visit      Cardiovascular and Mediastinum   Hypertension - Primary    Blood pressure well controlled. Heart rate 60. Patient has not taken bystolic today. I'm reassured by normal neurologic exam. Patient jointly agreed to trial amlodipine 2.5 mg which PCP prescribed  on Friday on its own due to patient's heart rate being 60. She will keep a blood pressure log. She will follow-up in one week or sooner blood pressure elevates greater than 140/90. Return precautions.       Relevant Medications   nebivolol (BYSTOLIC) 5 MG tablet   amLODipine (NORVASC) 2.5 MG tablet    Other Visit Diagnoses   None.       I have discontinued Ms. Kosier's furosemide and amLODipine. I am also having her maintain her rOPINIRole, LUNESTA, traMADol,  pregabalin, levETIRAcetam, ondansetron, nebivolol, diazepam, hydrocodone-acetaminophen, and sitaGLIPtin.   Meds ordered this encounter  Medications  . nebivolol (BYSTOLIC) 5 MG tablet    Sig: Take 5 mg by mouth daily.  . diazepam (VALIUM) 5 MG tablet    Sig: Take 5 mg by mouth once.  . hydrocodone-acetaminophen (ZYDONE) 5-400 MG tablet    Sig: Take 1 tablet by mouth every 6 (six) hours as needed for pain.  . sitaGLIPtin (JANUVIA) 100 MG tablet    Sig: Take 100 mg by mouth daily.    Return precautions given.   Risks, benefits, and alternatives of the medications and treatment plan prescribed today were discussed, and patient expressed understanding.   Education regarding symptom management and diagnosis given to patient on AVS.  Continue to follow with TULLO, Aris Everts, MD for routine health maintenance.   Denise Sharp and I agreed with plan.   Mable Paris, FNP

## 2016-05-22 ENCOUNTER — Ambulatory Visit (INDEPENDENT_AMBULATORY_CARE_PROVIDER_SITE_OTHER): Payer: Medicare Other | Admitting: Family

## 2016-05-22 ENCOUNTER — Encounter: Payer: Self-pay | Admitting: Family

## 2016-05-22 VITALS — BP 154/86 | HR 97 | Temp 98.2°F | Ht 64.0 in | Wt 238.6 lb

## 2016-05-22 DIAGNOSIS — I10 Essential (primary) hypertension: Secondary | ICD-10-CM | POA: Diagnosis not present

## 2016-05-22 MED ORDER — AMLODIPINE BESYLATE 5 MG PO TABS: 5.0000 mg | ORAL_TABLET | Freq: Every day | ORAL | 0 refills | Status: DC

## 2016-05-22 NOTE — Patient Instructions (Signed)
Increase amlodipine to 5 mg.   Monitor swelling in legs and let me know if worse.   Goal BP < 140/90  F/u one week.   If there is no improvement in your symptoms, or if there is any worsening of symptoms, or if you have any additional concerns, please return for re-evaluation; or, if we are closed, consider going to the Emergency Room for evaluation if symptoms urgent.  Managing Your High Blood Pressure Blood pressure is a measurement of how forceful your blood is pressing against the walls of the arteries. Arteries are muscular tubes within the circulatory system. Blood pressure does not stay the same. Blood pressure rises when you are active, excited, or nervous; and it lowers during sleep and relaxation. If the numbers measuring your blood pressure stay above normal most of the time, you are at risk for health problems. High blood pressure (hypertension) is a long-term (chronic) condition in which blood pressure is elevated. A blood pressure reading is recorded as two numbers, such as 120 over 80 (or 120/80). The first, higher number is called the systolic pressure. It is a measure of the pressure in your arteries as the heart beats. The second, lower number is called the diastolic pressure. It is a measure of the pressure in your arteries as the heart relaxes between beats.  Keeping your blood pressure in a normal range is important to your overall health and prevention of health problems, such as heart disease and stroke. When your blood pressure is uncontrolled, your heart has to work harder than normal. High blood pressure is a very common condition in adults because blood pressure tends to rise with age. Men and women are equally likely to have hypertension but at different times in life. Before age 79, men are more likely to have hypertension. After 70 years of age, women are more likely to have it. Hypertension is especially common in African Americans. This condition often has no signs or  symptoms. The cause of the condition is usually not known. Your caregiver can help you come up with a plan to keep your blood pressure in a normal, healthy range. BLOOD PRESSURE STAGES Blood pressure is classified into four stages: normal, prehypertension, stage 1, and stage 2. Your blood pressure reading will be used to determine what type of treatment, if any, is necessary. Appropriate treatment options are tied to these four stages:  Normal  Systolic pressure (mm Hg): below 120.  Diastolic pressure (mm Hg): below 80. Prehypertension  Systolic pressure (mm Hg): 120 to 139.  Diastolic pressure (mm Hg): 80 to 89. Stage1  Systolic pressure (mm Hg): 140 to 159.  Diastolic pressure (mm Hg): 90 to 99. Stage2  Systolic pressure (mm Hg): 160 or above.  Diastolic pressure (mm Hg): 100 or above. RISKS RELATED TO HIGH BLOOD PRESSURE Managing your blood pressure is an important responsibility. Uncontrolled high blood pressure can lead to:  A heart attack.  A stroke.  A weakened blood vessel (aneurysm).  Heart failure.  Kidney damage.  Eye damage.  Metabolic syndrome.  Memory and concentration problems. HOW TO MANAGE YOUR BLOOD PRESSURE Blood pressure can be managed effectively with lifestyle changes and medicines (if needed). Your caregiver will help you come up with a plan to bring your blood pressure within a normal range. Your plan should include the following: Education  Read all information provided by your caregivers about how to control blood pressure.  Educate yourself on the latest guidelines and treatment recommendations. New research  is always being done to further define the risks and treatments for high blood pressure. Lifestylechanges  Control your weight.  Avoid smoking.  Stay physically active.  Reduce the amount of salt in your diet.  Reduce stress.  Control any chronic conditions, such as high cholesterol or diabetes.  Reduce your alcohol  intake. Medicines  Several medicines (antihypertensive medicines) are available, if needed, to bring blood pressure within a normal range. Communication  Review all the medicines you take with your caregiver because there may be side effects or interactions.  Talk with your caregiver about your diet, exercise habits, and other lifestyle factors that may be contributing to high blood pressure.  See your caregiver regularly. Your caregiver can help you create and adjust your plan for managing high blood pressure. RECOMMENDATIONS FOR TREATMENT AND FOLLOW-UP  The following recommendations are based on current guidelines for managing high blood pressure in nonpregnant adults. Use these recommendations to identify the proper follow-up period or treatment option based on your blood pressure reading. You can discuss these options with your caregiver.  Systolic pressure of 223 to 361 or diastolic pressure of 80 to 89: Follow up with your caregiver as directed.  Systolic pressure of 224 to 497 or diastolic pressure of 90 to 100: Follow up with your caregiver within 2 months.  Systolic pressure above 530 or diastolic pressure above 051: Follow up with your caregiver within 1 month.  Systolic pressure above 102 or diastolic pressure above 111: Consider antihypertensive therapy; follow up with your caregiver within 1 week.  Systolic pressure above 735 or diastolic pressure above 670: Begin antihypertensive therapy; follow up with your caregiver within 1 week.   This information is not intended to replace advice given to you by your health care provider. Make sure you discuss any questions you have with your health care provider.   Document Released: 03/26/2012 Document Reviewed: 03/26/2012 Elsevier Interactive Patient Education [employer].

## 2016-05-22 NOTE — Progress Notes (Signed)
Pre visit review using our clinic review tool, if applicable. No additional management support is needed unless otherwise documented below in the visit note. 

## 2016-05-22 NOTE — Progress Notes (Signed)
Subjective:    Patient ID: Denise Sharp, female    DOB: 1946-03-04, 70 y.o.   MRN: [id number]  CC: [name] is a 70 y.o. female who presents today for follow up.   HPI: Patient here for follow-up on hypertension. She was started on amlodipine 2.5 mg one weeks ago. She continues to take her by systolic. She describes occasional palpitations over the past year, which wake her up from sleep. Has been to ED several times and resolves with rest. EKG's have been normal. Averages greater than 140/90. Highest 158/103.  No palpitations over the past week.  urologyist , Dr Jacqlyn Larsen, told patient from adrenal mass causes palpitations, 'dormant per patient' and not going to perform surgery at this time. Follow up scheduled 2018.    Denies exertional chest pain or pressure, numbness or tingling radiating to left arm or jaw, dizziness, frequent headaches, changes in vision, or shortness of breath.   Chronic LE edema. Swelling is not worse in legs since starting amlodipine. Prescribed lasix however hasn't taken it as she has interstim for bladder and wants to trial lasix when she is home for a couple of days.       HISTORY:  Past Medical History:  Diagnosis Date  . Abnormality of gait 03/25/2013  . Adrenal mass ([place])   . Adrenal mass, left ([place])   . Anemia    iron deficient post  2 unit txfsn 2009, normal endo/colonoscopy by [facility]  . Apnea   . Arthritis   . Cervical spinal stenosis 1994   due to trauma to back ([employer] accident), has intermittent paralysis and parasthesias  . Cervicogenic headache 03/23/2014  . Depression   . Diabetes mellitus   . Dizziness    chronic dizziness  . Esophageal stenosis March 2011   with transietn outlet obstruction by food, cleared by EGD   . Headache(784.0)   . Hypertension   . IBS (irritable bowel syndrome)   . LBBB (left bundle branch block)   . Obesity   . Obstructive sleep apnea    using CPAP  . 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  Restless leg syndrome   . Syncope and collapse 03/12/2014   Past Surgical History:  Procedure Laterality Date  . ABDOMINAL HYSTERECTOMY    . APPENDECTOMY    . DIAPHRAGMATIC HERNIA REPAIR  2015  . [place]  . GALLBLADDER SURGERY  resection  . GASTRIC BYPASS    . GASTRIC BYPASS  2000, 2005   Dr. Debroah Loop  . [facility] IMPLANT PLACEMENT  April 2013   [place]  . JOINT REPLACEMENT  2007   bilateral knee. [name],  [name]  . ROTATOR CUFF REPAIR     right  . [place]  . TOTAL ABDOMINAL HYSTERECTOMY W/ BILATERAL SALPINGOOPHORECTOMY  1974  . UMBILICAL HERNIA REPAIR  Aug 11, 2015   Family History  Problem Relation Age of Onset  . Heart disease Father   . Hypertension Father   . Prostate cancer Father   . Stroke Father   . Osteoporosis Father   . Stroke Mother   . Depression Mother   . Headache Mother   . Heart disease Mother   . Thyroid disease Mother   . Hypertension Mother   . Diabetes Daughter   . Heart disease Daughter   . Hypertension Daughter   . Hypertension Son     Allergies: Diovan [valsartan]; Erythromycin; Flagyl [metronidazole]; Glucophage [metformin hcl]; Xanax [alprazolam]; Amoxicillin; Demeclocycline;  Erythromycin base; Metformin; Sulfa antibiotics; and Tetracyclines & related Current Outpatient Prescriptions on File Prior to Visit  Medication Sig Dispense Refill  . diazepam (VALIUM) 5 MG tablet Take 5 mg by mouth once.    . hydrocodone-acetaminophen (ZYDONE) 5-400 MG tablet Take 1 tablet by mouth every 6 (six) hours as needed for pain.    [name] levETIRAcetam (KEPPRA) 500 MG tablet Take 1 tablet (500 mg total) by mouth 2 (two) times daily. 60 tablet 3  . LUNESTA 2 MG TABS tablet Take 1 tablet (2 mg total) by mouth at bedtime as needed for sleep. Take immediately before bedtime 90 tablet 1  . nebivolol (BYSTOLIC) 5 MG tablet Take 5 mg by mouth daily.    . ondansetron (ZOFRAN) 4 MG tablet Take 1 tablet (4 mg total) by mouth every 8  (eight) hours as needed for nausea or vomiting. 15 tablet 0  . pregabalin (LYRICA) 100 MG capsule Take 1 capsule (100 mg total) by mouth 2 (two) times daily.    [name] rOPINIRole (REQUIP) 1 MG tablet Take 1 tablet (1 mg total) by mouth at bedtime. 90 tablet 2  . sitaGLIPtin (JANUVIA) 100 MG tablet Take 100 mg by mouth daily.    . traMADol (ULTRAM) 50 MG tablet Take 1 tablet (50 mg total) by mouth 4 (four) times daily as needed. 30 tablet 4   No current facility-administered medications on file prior to visit.     Social History  Substance Use Topics  . Smoking status: Never Smoker  . Smokeless tobacco: Never Used  . Alcohol use No    Review of Systems  Constitutional: Negative for chills and fever.  Respiratory: Negative for cough.   Cardiovascular: Negative for chest pain and palpitations.  Gastrointestinal: Negative for nausea and vomiting.      Objective:    BP (!) 154/86   Pulse 97   Temp 98.2 F (36.8 C) (Oral)   Ht 5\' 4"  (1.626 m)   Wt 238 lb 9.6 oz (108.2 kg)   SpO2 97%   BMI 40.96 kg/m  BP Readings from Last 3 Encounters:  05/22/16 (!) 154/86  05/14/16 138/68  05/09/16 128/74   Wt Readings from Last 3 Encounters:  05/22/16 238 lb 9.6 oz (108.2 kg)  05/14/16 238 lb 6.4 oz (108.1 kg)  04/03/16 245 lb (111.1 kg)    Physical Exam  Constitutional: She appears well-developed and well-nourished.  Eyes: Conjunctivae are normal.  Cardiovascular: Normal rate, regular rhythm, normal heart sounds and normal pulses.   Pulmonary/Chest: Effort normal and breath sounds normal. She has no wheezes. She has no rhonchi. She has no rales.  Neurological: She is alert.  Skin: Skin is warm and dry.  Psychiatric: She has a normal mood and affect. Her speech is normal and behavior is normal. Thought content normal.  Vitals reviewed.      Assessment & Plan:   Problem List Items Addressed This Visit      Cardiovascular and Mediastinum   Hypertension - Primary    Blood pressure  not at goal. No signs or symptoms of hypertensive urgency or emergency at this time. Patient and I jointly agreed to increase dose of amlodipine to see if better control. We discussed at great length the side effect of LE swelling in the context of her chronic LE swelling. she will continue to stay vigilant with this side effect. Follow-up in one more week. Suspect that adrenal mass may be contributory and patient has follow up in 2018 with  uroology. Will consider earlier follow up if not improved at f/u.      Relevant Medications   cholestyramine light (PREVALITE) 4 GM/DOSE powder   amLODipine (NORVASC) 5 MG tablet       I have changed Ms. Huish's amLODipine. I am also having her maintain her rOPINIRole, LUNESTA, traMADol, pregabalin, levETIRAcetam, ondansetron, nebivolol, diazepam, hydrocodone-acetaminophen, sitaGLIPtin, cholestyramine light, and hyoscyamine.   Meds ordered this encounter  Medications  . cholestyramine light (PREVALITE) 4 GM/DOSE powder    Sig: Take 4 g by mouth 2 (two) times daily.  . hyoscyamine (LEVSIN SL) 0.125 MG SL tablet    Sig: Place 0.125 mg under the tongue 2 (two) times daily.  [name] amLODipine (NORVASC) 5 MG tablet    Sig: Take 1 tablet (5 mg total) by mouth daily.    Dispense:  90 tablet    Refill:  0    Order Specific Question:   Supervising Provider    Answer:   [name] [[id number]]    Return precautions given.   Risks, benefits, and alternatives of the medications and treatment plan prescribed today were discussed, and patient expressed understanding.   Education regarding symptom management and diagnosis given to patient on AVS.  Continue to follow with TULLO, Aris Everts, MD for routine health maintenance.   Denise Sharp and I agreed with plan.   Mable Paris, FNP

## 2016-05-23 NOTE — Assessment & Plan Note (Signed)
Blood pressure not at goal. No signs or symptoms of hypertensive urgency or emergency at this time. Patient and I jointly agreed to increase dose of amlodipine to see if better control. We discussed at great length the side effect of LE swelling in the context of her chronic LE swelling. she will continue to stay vigilant with this side effect. Follow-up in one more week. Suspect that adrenal mass may be contributory and patient has follow up in 2018 with uroology. Will consider earlier follow up if not improved at f/u.

## 2016-05-24 ENCOUNTER — Telehealth: Payer: Self-pay | Admitting: *Deleted

## 2016-05-24 NOTE — Telephone Encounter (Signed)
Error

## 2016-05-29 ENCOUNTER — Ambulatory Visit (INDEPENDENT_AMBULATORY_CARE_PROVIDER_SITE_OTHER): Payer: Medicare Other | Admitting: Family

## 2016-05-29 ENCOUNTER — Telehealth: Payer: Self-pay | Admitting: Internal Medicine

## 2016-05-29 ENCOUNTER — Encounter: Payer: Self-pay | Admitting: Family

## 2016-05-29 VITALS — BP 130/90 | HR 75 | Temp 98.1°F | Ht 64.0 in | Wt 235.2 lb

## 2016-05-29 DIAGNOSIS — I10 Essential (primary) hypertension: Secondary | ICD-10-CM | POA: Diagnosis not present

## 2016-05-29 DIAGNOSIS — E559 Vitamin D deficiency, unspecified: Secondary | ICD-10-CM

## 2016-05-29 DIAGNOSIS — E1142 Type 2 diabetes mellitus with diabetic polyneuropathy: Secondary | ICD-10-CM

## 2016-05-29 DIAGNOSIS — J209 Acute bronchitis, unspecified: Secondary | ICD-10-CM | POA: Diagnosis not present

## 2016-05-29 DIAGNOSIS — D508 Other iron deficiency anemias: Secondary | ICD-10-CM

## 2016-05-29 MED ORDER — ALBUTEROL SULFATE HFA 108 (90 BASE) MCG/ACT IN AERS: 2.0000 | INHALATION_SPRAY | Freq: Three times a day (TID) | RESPIRATORY_TRACT | 0 refills | Status: DC | PRN

## 2016-05-29 MED ORDER — AMLODIPINE BESYLATE 5 MG PO TABS: 5.0000 mg | ORAL_TABLET | Freq: Every day | ORAL | 0 refills | Status: DC

## 2016-05-29 NOTE — Patient Instructions (Addendum)
BP looks great.  Keep an eye out for leg swelling.   Happy to hear congestion improving,  Let me know if worsens.   Increase intake of clear fluids. Congestion is best treated by hydration, when mucus is wetter, it is thinner, less sticky, and easier to expel from the body, either through coughing up drainage, or by blowing your nose.   Get plenty of rest.   Use saline nasal drops and blow your nose frequently. Run a humidifier at night and elevate the head of the bed. Vicks Vapor rub will help with congestion and cough. Steam showers and sinus massage for congestion.   Use Acetaminophen or Ibuprofen as needed for fever or pain. Avoid second hand smoke. Even the smallest exposure will worsen symptoms.   Over the counter medications you can try include Delsym for cough, a decongestant for congestion, and Mucinex or Robitussin as an expectorant. Be sure to just get the plain Mucinex or Robitussin that just has one medication (Guaifenesen). We don't recommend the combination products. Note, be sure to drink two glasses of water with each dose of Mucinex as the medication will not work well without adequate hydration.   You can also try a teaspoon of honey to see if this will help reduce cough. Throat lozenges can sometimes be beneficial as well.    This illness will typically last 7 - 10 days.   Please follow up with our clinic if you develop a fever greater than 101 F, symptoms worsen, or do not resolve in the next week.

## 2016-05-29 NOTE — Assessment & Plan Note (Signed)
Controlled on 5mg  amlodipine. No LE swelling. F/u with PCP.

## 2016-05-29 NOTE — Telephone Encounter (Signed)
Pt has an appt on 12/29 and would like [name] to put in some labs ordered prior to her appt. Please advise, thank you!  Call pt @ [phone]

## 2016-05-29 NOTE — Assessment & Plan Note (Signed)
Sa02 96. Afebrile. Suspect viral. Reassured improving. Patient and I jointly agreed on conservative therapy and delayed antibiotics, especially in the context of her chronic diarrhea.

## 2016-05-29 NOTE — Progress Notes (Signed)
Pre visit review using our clinic review tool, if applicable. No additional management support is needed unless otherwise documented below in the visit note. 

## 2016-05-29 NOTE — Progress Notes (Signed)
Subjective:    Patient ID: Denise Sharp, female    DOB: 01/21/1946, 69 y.o.   MRN: [id number]  CC: Denise Sharp is a 70 y.o. female who presents today for follow up.   HPI: Patient for follow-up on hypertension. Last visit she was increased to 5 mg amlodipine from 2.5 mg.  No leg swelling. Not taking bystolic as amlodipine is working better.   Productive cough and congestion 3-4 days, improving. Mucinex and alkiselzter cold with some relief.  Endorses right ear fullness, sore throat. Cannot 'take antibiotics due to gut' and worsens diarrhea.  No wheezing, fever, chills.       HISTORY:  Past Medical History:  Diagnosis Date  . Abnormality of gait 03/25/2013  . Adrenal mass ([place])   . Adrenal mass, left ([place])   . Anemia    iron deficient post  2 unit txfsn 2009, normal endo/colonoscopy by [facility]  . Apnea   . Arthritis   . Cervical spinal stenosis 1994   due to trauma to back ([employer] accident), has intermittent paralysis and parasthesias  . Cervicogenic headache 03/23/2014  . Depression   . Diabetes mellitus   . Dizziness    chronic dizziness  . Esophageal stenosis March 2011   with transietn outlet obstruction by food, cleared by EGD   . Headache(784.0)   . Hypertension   . IBS (irritable bowel syndrome)   . LBBB (left bundle branch block)   . Obesity   . Obstructive sleep apnea    using CPAP  . 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 Restless leg syndrome   . Syncope and collapse 03/12/2014   Past Surgical History:  Procedure Laterality Date  . ABDOMINAL HYSTERECTOMY    . APPENDECTOMY    . DIAPHRAGMATIC HERNIA REPAIR  2015  . [place]  . GALLBLADDER SURGERY  resection  . GASTRIC BYPASS    . GASTRIC BYPASS  2000, 2005   Dr. Debroah Loop  . [facility] IMPLANT PLACEMENT  April 2013   [place]  . JOINT REPLACEMENT  2007   bilateral knee. [name],  [name]  . ROTATOR CUFF REPAIR     right  . [place]  . TOTAL ABDOMINAL  HYSTERECTOMY W/ BILATERAL SALPINGOOPHORECTOMY  1974  . UMBILICAL HERNIA REPAIR  Aug 11, 2015   Family History  Problem Relation Age of Onset  . Heart disease Father   . Hypertension Father   . Prostate cancer Father   . Stroke Father   . Osteoporosis Father   . Stroke Mother   . Depression Mother   . Headache Mother   . Heart disease Mother   . Thyroid disease Mother   . Hypertension Mother   . Diabetes Daughter   . Heart disease Daughter   . Hypertension Daughter   . Hypertension Son     Allergies: Diovan [valsartan]; Erythromycin; Flagyl [metronidazole]; Glucophage [metformin hcl]; Xanax [alprazolam]; Amoxicillin; Demeclocycline; Erythromycin base; Metformin; Sulfa antibiotics; and Tetracyclines & related Current Outpatient Prescriptions on File Prior to Visit  Medication Sig Dispense Refill  . cholestyramine light (PREVALITE) 4 GM/DOSE powder Take 4 g by mouth 2 (two) times daily.    . diazepam (VALIUM) 5 MG tablet Take 5 mg by mouth once.    . hydrocodone-acetaminophen (ZYDONE) 5-400 MG tablet Take 1 tablet by mouth every 6 (six) hours as needed for pain.    . hyoscyamine (LEVSIN SL) 0.125 MG SL tablet Place 0.125 mg under the tongue 2 (  two) times daily.    [name] levETIRAcetam (KEPPRA) 500 MG tablet Take 1 tablet (500 mg total) by mouth 2 (two) times daily. 60 tablet 3  . LUNESTA 2 MG TABS tablet Take 1 tablet (2 mg total) by mouth at bedtime as needed for sleep. Take immediately before bedtime 90 tablet 1  . ondansetron (ZOFRAN) 4 MG tablet Take 1 tablet (4 mg total) by mouth every 8 (eight) hours as needed for nausea or vomiting. 15 tablet 0  . pregabalin (LYRICA) 100 MG capsule Take 1 capsule (100 mg total) by mouth 2 (two) times daily.    [name] rOPINIRole (REQUIP) 1 MG tablet Take 1 tablet (1 mg total) by mouth at bedtime. 90 tablet 2  . sitaGLIPtin (JANUVIA) 100 MG tablet Take 100 mg by mouth daily.    . traMADol (ULTRAM) 50 MG tablet Take 1 tablet (50 mg total) by mouth 4 (four)  times daily as needed. 30 tablet 4   No current facility-administered medications on file prior to visit.     Social History  Substance Use Topics  . Smoking status: Never Smoker  . Smokeless tobacco: Never Used  . Alcohol use No    Review of Systems  Constitutional: Negative for chills and fever.  HENT: Positive for congestion, ear pain and sore throat. Negative for sinus pain and sinus pressure.   Eyes: Negative for visual disturbance.  Respiratory: Positive for cough. Negative for shortness of breath and wheezing.   Cardiovascular: Negative for chest pain and palpitations.  Gastrointestinal: Negative for nausea and vomiting.  Neurological: Negative for headaches.      Objective:    BP 130/90   Pulse 75   Temp 98.1 F (36.7 C) (Oral)   Ht 5\' 4"  (1.626 m)   Wt 235 lb 3.2 oz (106.7 kg)   SpO2 96%   BMI 40.37 kg/m  BP Readings from Last 3 Encounters:  05/29/16 130/90  05/22/16 (!) 154/86  05/14/16 138/68   Wt Readings from Last 3 Encounters:  05/29/16 235 lb 3.2 oz (106.7 kg)  05/22/16 238 lb 9.6 oz (108.2 kg)  05/14/16 238 lb 6.4 oz (108.1 kg)    Physical Exam  Constitutional: She appears well-developed and well-nourished.  HENT:  Head: Normocephalic and atraumatic.  Right Ear: Hearing, tympanic membrane, external ear and ear canal normal. No drainage, swelling or tenderness. No foreign bodies. Tympanic membrane is not erythematous and not bulging. No middle ear effusion. No decreased hearing is noted.  Left Ear: Hearing, tympanic membrane, external ear and ear canal normal. No drainage, swelling or tenderness. No foreign bodies. Tympanic membrane is not erythematous and not bulging.  No middle ear effusion. No decreased hearing is noted.  Nose: Nose normal. No rhinorrhea. Right sinus exhibits no maxillary sinus tenderness and no frontal sinus tenderness. Left sinus exhibits no maxillary sinus tenderness and no frontal sinus tenderness.  Mouth/Throat: Uvula is  midline, oropharynx is clear and moist and mucous membranes are normal. No oropharyngeal exudate, posterior oropharyngeal edema, posterior oropharyngeal erythema or tonsillar abscesses.  Eyes: Conjunctivae are normal.  Cardiovascular: Regular rhythm, normal heart sounds and normal pulses.   Pulmonary/Chest: Effort normal and breath sounds normal. She has no wheezes. She has no rhonchi. She has no rales.  Lymphadenopathy:       Head (right side): No submental, no submandibular, no tonsillar, no preauricular, no posterior auricular and no occipital adenopathy present.       Head (left side): No submental, no submandibular, no tonsillar, no  preauricular, no posterior auricular and no occipital adenopathy present.    She has no cervical adenopathy.  Neurological: She is alert.  Skin: Skin is warm and dry.  Psychiatric: She has a normal mood and affect. Her speech is normal and behavior is normal. Thought content normal.  Vitals reviewed.      Assessment & Plan:   Problem List Items Addressed This Visit      Cardiovascular and Mediastinum   Hypertension    Controlled on 5mg  amlodipine. No LE swelling. F/u with PCP.       Relevant Medications   amLODipine (NORVASC) 5 MG tablet     Respiratory   Acute bronchitis - Primary    Sa02 96. Afebrile. Suspect viral. Reassured improving. Patient and I jointly agreed on conservative therapy and delayed antibiotics, especially in the context of her chronic diarrhea.      Relevant Medications   albuterol (PROVENTIL HFA;VENTOLIN HFA) 108 (90 Base) MCG/ACT inhaler       I have discontinued Ms. Minchey's nebivolol. I am also having her maintain her rOPINIRole, LUNESTA, traMADol, pregabalin, levETIRAcetam, ondansetron, diazepam, hydrocodone-acetaminophen, sitaGLIPtin, cholestyramine light, hyoscyamine, albuterol, and amLODipine.   Meds ordered this encounter  Medications  . DISCONTD: albuterol (PROVENTIL HFA;VENTOLIN HFA) 108 (90 Base) MCG/ACT  inhaler    Sig: Inhale 2 puffs into the lungs every 8 (eight) hours as needed for wheezing or shortness of breath.    Dispense:  1 Inhaler    Refill:  0    Order Specific Question:   Supervising Provider    Answer:   [name], TERESA L [[id number]]  . albuterol (PROVENTIL HFA;VENTOLIN HFA) 108 (90 Base) MCG/ACT inhaler    Sig: Inhale 2 puffs into the lungs every 8 (eight) hours as needed for wheezing or shortness of breath.    Dispense:  1 Inhaler    Refill:  0    Order Specific Question:   Supervising Provider    Answer:   [name], TERESA L [[id number]]  . amLODipine (NORVASC) 5 MG tablet    Sig: Take 1 tablet (5 mg total) by mouth daily.    Dispense:  90 tablet    Refill:  0    Order Specific Question:   Supervising Provider    Answer:   [name] [[id number]]    Return precautions given.   Risks, benefits, and alternatives of the medications and treatment plan prescribed today were discussed, and patient expressed understanding.   Education regarding symptom management and diagnosis given to patient on AVS.  Continue to follow with TULLO, Aris Everts, MD for routine health maintenance.   Denise Sharp and I agreed with plan.   Mable Paris, FNP

## 2016-05-31 NOTE — Telephone Encounter (Signed)
Please advise 

## 2016-05-31 NOTE — Telephone Encounter (Signed)
Fasting labs ordered

## 2016-06-08 ENCOUNTER — Encounter: Payer: Self-pay | Admitting: Internal Medicine

## 2016-06-08 ENCOUNTER — Ambulatory Visit (INDEPENDENT_AMBULATORY_CARE_PROVIDER_SITE_OTHER): Payer: Medicare Other | Admitting: Internal Medicine

## 2016-06-08 DIAGNOSIS — J208 Acute bronchitis due to other specified organisms: Secondary | ICD-10-CM

## 2016-06-08 DIAGNOSIS — Z9989 Dependence on other enabling machines and devices: Secondary | ICD-10-CM

## 2016-06-08 DIAGNOSIS — G4733 Obstructive sleep apnea (adult) (pediatric): Secondary | ICD-10-CM

## 2016-06-08 DIAGNOSIS — B001 Herpesviral vesicular dermatitis: Secondary | ICD-10-CM

## 2016-06-08 MED ORDER — VALACYCLOVIR HCL 500 MG PO TABS: ORAL_TABLET | ORAL | 3 refills | Status: DC

## 2016-06-08 MED ORDER — AZITHROMYCIN 250 MG PO TABS: ORAL_TABLET | ORAL | 0 refills | Status: DC

## 2016-06-08 MED ORDER — VALACYCLOVIR HCL 500 MG PO TABS: ORAL_TABLET | ORAL | 1 refills | Status: DC

## 2016-06-08 NOTE — Patient Instructions (Signed)
Script sent for to [facility]  Script sent to [facility] and to mail order for Valtrex for herpes sore.  Ok to use the Ventolin inhaler    2 puffs every 4-6 hours if needed for asthma  We can continue CPAP 6/ Advanced, mask of choice, humidifier, supplies, AirView    Dx OSA  Please call if we can help

## 2016-06-08 NOTE — Progress Notes (Signed)
HPI F never smoker followed for OSA, Hypersomnia, Insomnia complicated by IBS NPSG 2/62/03 at [facility]/ Sleep Med   Mild OSA, AHI 13.2/ hr, weight 218 lbs   CPAP 6 NPSG 05/22/15- CPAP Titration study- Optimal 7 cwp,  MSLT 05/23/15 ( she had taken Lyrica the night before during CPAP titration against protocol). 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 Melvyn Novas for pulmonary CPAP 6/  Valium, Lunesta, Zanaflex, Requip FOLLOWS FOR: DME: Pt needs new local company-does not have one(hard to get someone to take [facility]). DL attached.   Pt is having increased weight-19# in 2-3 weeks and extreme SOB with Right leg pain. Pt was not able to be seen for physical due to being slightly late for appt-next appt is July 2017. Recent surgery related to recurrent umbilical hernia Still feels tired. Download shows 73% compliance with use at least 4 hours a night. Residual AHI is not reported. GI Dr. deferred her trial of Provigil because of her diarrhea. She is told she does not snore with CPAP mask in place. Does snore without it. Complains of fluid retention in the last 2 weeks with some dyspnea on exertion she associates with weight gain. Calves swelling, right more than left. Some tenderness especially in left lower leg pretibially. No history DVT. 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 Melvyn Novas for pulmonary CPAP 6/  Valium, Lunesta, Zanaflex, Requip FOLLOWS FOR: Wears CPAP nightly. Denies problems with mask /pressure. DME: [facility] ;   Pt c/o head congestion, ear ache, chest discomfort with "hacky" cough, PND and sore throat. Denies fever.  Pt has used OTC Mucinex,  Alkaseltzer and antihistamine and nothing seems to be working. Pt also now has a fever blister - requests something be called in to help treat this,  URI x 1 month w head and ear pressure. Tends to avoid abx due to IBS. Has Ventolin HFA for occ use. Comfortable w CPAP 6. Good compliance on download. ? DME Blames sleep disturbance on frequent bathroom trips. Meds help.  ROS-see HPI   Negative unless "+" Constitutional:    weight loss, night sweats, fevers, chills,+ fatigue, lassitude. HEENT:   + headaches, +ifficulty swallowing, tooth/dental problems, sore throat,       sneezing, itching, ear ache, +nasal congestion, post nasal drip, snoring CV:    chest pain, orthopnea, PND, + swelling in lower extremities, anasarca,                                                    dizziness, palpitations Resp:  +shortness of breath with exertion or at rest.                productive cough,   non-productive cough, coughing up of blood.              change in color of mucus.  wheezing.   Skin:    rash or lesions. GI:  + IBS GU: dysuria, change in color of urine, no urgency or frequency.   MS:   +  joint pain, stiffness, decreased range of motion, back pain. Neuro-     nothing unusual Psych:  change in mood or affect.  depression or anxiety.   memory loss.  OBJ- Physical Exam General- Alert, Oriented, Affect-appropriate, Distress- none acute, +obese,+ talkative and brisk. Skin- rash-none, lesions- none, excoriation- none. + Herpetic fever blister lip Lymphadenopathy- none Head- atraumatic            Eyes- Gross vision intact, PERRLA, conjunctivae and secretions clear            Ears- Hearing, canals-normal            Nose- Clear, no-Septal dev, mucus, polyps, erosion, perforation             Throat- Mallampati III , mucosa clear , drainage- none, tonsils- atrophic, + dentures Neck- flexible , trachea midline, no stridor , thyroid nl, carotid no bruit Chest - symmetrical excursion , unlabored            Heart/CV- RRR , no murmur , no gallop  , no rub, nl s1 s2                           - JVD- none , edema , stasis changes- none, varices- none           Lung- clear to P&A, wheeze- none, cough- none , dullness-none, rub- none, rales-none, +O2 sat 98% RA           Chest wall-  Abd-  Br/ Gen/ Rectal- Not done, not indicated Extrem- cyanosis- none, clubbing, none, atrophy- none, strength- nl, Homans +R Neuro- grossly intact to observation

## 2016-06-10 DIAGNOSIS — B001 Herpesviral vesicular dermatitis: Secondary | ICD-10-CM | POA: Insufficient documentation

## 2016-06-10 NOTE — Assessment & Plan Note (Signed)
Recurrent Herpes cold sore Valtrex

## 2016-06-10 NOTE — Assessment & Plan Note (Signed)
Z pak

## 2016-06-10 NOTE — Assessment & Plan Note (Signed)
Continues to feel better with CPAP. EDS blamed on frequent bathroom trips Continues CPAP 6 with Lunesta to help stabilize sleep when needed.

## 2016-06-11 NOTE — Progress Notes (Deleted)
Denise Sharp  [phone]) [phone] Fax:(336) [phone]  ID: Denise Sharp OB: December 26, 1945  MR#: [id number]  TIR#:[id number]  Patient Care Team: [name], MD as PCP - General (Internal Medicine) [facility] as Referring Physician  CHIEF COMPLAINT: Iron deficiency anemia.  INTERVAL HISTORY: Patient returns to clinic today for repeat laboratory work and further evaluation. She continues to have weakness and fatigue. She also has pelvic pain and discomfort with urination secondary to vaginal cyst and UTI. She has been initiated on Cipro by her primary care physician and has an appointment with her urologist tomorrow. She has no neurologic complaints. She denies any recent fevers or illnesses. She has a good appetite and denies weight loss. She denies any chest pain or shortness of breath. She denies any nausea, vomiting, constipation, or diarrhea. She has no melena or hematochezia. She has no urinary complaints. Patient otherwise feels well and offers no further specific complaints today.  REVIEW OF SYSTEMS:   Review of Systems  Constitutional: Positive for malaise/fatigue. Negative for fever and weight loss.  Respiratory: Negative.  Negative for shortness of breath.   Cardiovascular: Negative.  Negative for chest pain.  Gastrointestinal: Negative.  Negative for blood in stool and melena.  Genitourinary: Negative.   Musculoskeletal: Negative.   Neurological: Positive for weakness.  Psychiatric/Behavioral: Negative.     As per HPI. Otherwise, a complete review of systems is negatve.  PAST MEDICAL HISTORY: Past Medical History  Diagnosis Date  . Diabetes mellitus   . Depression   . Hypertension   . Cervical spinal stenosis 1994    due to trauma to back ([employer] accident), has intermittent paralysis and parasthesias  . Adrenal mass ([place])   . IBS (irritable bowel syndrome)   . Obstructive sleep apnea     using CPAP  . Anemia     iron deficient  post  2 unit txfsn 2009, normal end[facility]/colonoscopy [facility]  . Esophageal stenosis March 2011    with transietn outlet obstruction by food, cleared by EGD   . LBBB (left bundle branch block)   . Obesity   . Apnea   . Headache(784.0)   . Arthritis   . Restless leg syndrome   . Dizziness     chronic dizziness  . Adrenal mass, left ([place])   . 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 Abnormality of gait 03/25/2013  . Syncope and collapse 03/12/2014  . Cervicogenic headache 03/23/2014    PAST SURGICAL HISTORY: Past Surgical History  Procedure Laterality Date  . Gastric bypass    . Abdominal hysterectomy    . Gastric bypass  2000, 2005    Dr. Debroah Loop  . Appendectomy    . Gallbladder surgery  1976  . Rotator cuff repair      right  . Joint replacement  2007    bilateral knee. [name],  [name]  . Spine surgery  1995    [place]  . Interstim implant placement  April 2013    [place]  . Gallbladder surgery  resection  . Total abdominal hysterectomy w/ bilateral salpingoophorectomy  1974  . Umbilical hernia repair  Aug 11, 2015  . Diaphragmatic hernia repair  2015    FAMILY HISTORY Family History  Problem Relation Age of Onset  . Heart disease Father   . Hypertension Father   . Prostate cancer Father   . Stroke Father   . Osteoporosis Father   . Stroke Mother   . Depression Mother   . Headache Mother   .  Heart disease Mother   . Thyroid disease Mother   . Hypertension Mother   . Diabetes Daughter   . Heart disease Daughter   . Hypertension Daughter   . Hypertension Son        ADVANCED DIRECTIVES:    HEALTH MAINTENANCE: Social History  Substance Use Topics  . Smoking status: Never Smoker   . Smokeless tobacco: Never Used  . Alcohol Use: No     Colonoscopy:  PAP:  Bone density:  Lipid panel:  Allergies  Allergen Reactions  . Diovan [Valsartan]   . Erythromycin   . Flagyl [Metronidazole]   . Glucophage [Metformin Hcl]   . Xanax [Alprazolam]   .  Amoxicillin Diarrhea  . Demeclocycline Hives  . Erythromycin Base   . Metformin   . Sulfa Antibiotics Rash    As child  . Tetracyclines & Related Hives and Rash    Current Outpatient Prescriptions  Medication Sig Dispense Refill  . alosetron (LOTRONEX) 1 MG tablet Take 1 tablet (1 mg total) by mouth 2 (two) times daily. 60 tablet 11  . benzonatate (TESSALON) 200 MG capsule Take 1 capsule (200 mg total) by mouth 3 (three) times daily as needed for cough. 180 capsule 1  . Cholecalciferol (VITAMIN D3) 10000 UNITS capsule Take 10,000 Units by mouth once a week. Reported on 12/22/2015    . cyanocobalamin (,VITAMIN B-12,) 1000 MCG/ML injection 1000 ml (1 ml) injection two times  monthly 10 mL 3  . Eluxadoline 100 MG TABS Take by mouth. Reported on 12/13/2015    . ergocalciferol (DRISDOL) 50000 units capsule Take by mouth. Reported on 12/13/2015    . hydrocodone-acetaminophen (LORCET-HD) 5-500 MG per capsule Take 1 capsule by mouth every 6 (six) hours as needed. 30 capsule 3  . loperamide (IMODIUM A-D) 2 MG tablet Take by mouth.    . mometasone (ELOCON) 0.1 % cream Apply 1 application topically daily. 45 g 0  . nebivolol (BYSTOLIC) 5 MG tablet Take 1 tablet (5 mg total) by mouth daily. 90 tablet 1  . nystatin (MYCOSTATIN) powder Apply topically 2 (two) times daily. 60 g 3  . rOPINIRole (REQUIP) 1 MG tablet Take 1 tablet (1 mg total) by mouth at bedtime. 90 tablet 2  . sitaGLIPtin (JANUVIA) 100 MG tablet Take 1 tablet (100 mg total) by mouth daily. 90 tablet 2  . traMADol (ULTRAM) 50 MG tablet Take 50 mg by mouth 4 (four) times daily as needed.    . Blood Pressure Monitoring (BLOOD PRESSURE MONITOR/ARM) DEVI Automatic BP machine.  Normal adult size arm (Patient not taking: Reported on 12/13/2015) 1 Device 0  . diazepam (VALIUM) 10 MG tablet Take 1 tablet (10 mg total) by mouth at bedtime as needed for anxiety. 21 tablet 0  . furosemide (LASIX) 20 MG tablet Take 1 tablet (20 mg total) by mouth daily.  (Patient not taking: Reported on 12/13/2015) 30 tablet 3  . Glucose Blood (BAYER BREEZE 2 TEST) DISK Use one test strip each time glucose needs testing. (Patient not taking: Reported on 12/22/2015) 50 each 2  . LUNESTA 2 MG TABS tablet Take 1 tablet (2 mg total) by mouth at bedtime as needed for sleep. Take immediately before bedtime 21 tablet 0  . pregabalin (LYRICA) 100 MG capsule One capsule in the morning and two in the evening 63 capsule 0   No current facility-administered medications for this visit.    OBJECTIVE: Filed Vitals:   12/22/15 1201  BP: 132/69  Pulse: 49  Resp: 18     There is no weight on file to calculate BMI.    ECOG FS:0 - Asymptomatic  General: Well-developed, well-nourished, no acute distress. Eyes: Pink conjunctiva, anicteric sclera. Lungs: Clear to auscultation bilaterally. Heart: Regular rate and rhythm. No rubs, murmurs, or gallops. Abdomen: Soft, nontender, nondistended. No organomegaly noted, normoactive bowel sounds. Musculoskeletal: No edema, cyanosis, or clubbing. Neuro: Alert, answering all questions appropriately. Cranial nerves grossly intact. Skin: No rashes or petechiae noted. Psych: Normal affect.  LAB RESULTS:  Lab Results  Component Value Date   NA 141 12/07/2015   K 3.9 12/07/2015   CL 106 12/07/2015   CO2 23 12/07/2015   GLUCOSE 140* 12/07/2015   BUN 13 12/07/2015   CREATININE 1.03* 12/07/2015   CALCIUM 9.0 12/07/2015   PROT 7.0 11/24/2015   ALBUMIN 3.8 11/24/2015   AST 14 11/24/2015   ALT 11 11/24/2015   ALKPHOS 126* 11/24/2015   BILITOT 0.5 11/24/2015   GFRNONAA 54* 12/07/2015   GFRAA >60 12/07/2015    CBC    Component Value Date/Time   WBC 9.5 06/12/2016 1315   RBC 4.76 06/12/2016 1315   HGB 14.0 06/12/2016 1315   HGB 13.1 06/21/2014 1349   HCT 42.1 06/12/2016 1315   HCT 41.4 06/21/2014 1349   PLT 305 06/12/2016 1315   PLT 308 06/21/2014 1349   MCV 88.6 06/12/2016 1315   MCV 88 06/21/2014 1349   MCH 29.4  06/12/2016 1315   MCHC 33.2 06/12/2016 1315   RDW 14.4 06/12/2016 1315   RDW 15.4 (H) 06/21/2014 1349   LYMPHSABS 2.5 06/12/2016 1315   LYMPHSABS 2.3 02/25/2014 2153   MONOABS 0.4 06/12/2016 1315   MONOABS 0.6 02/25/2014 2153   EOSABS 0.2 06/12/2016 1315   EOSABS 0.1 02/25/2014 2153   BASOSABS 0.1 06/12/2016 1315   BASOSABS 0.1 02/25/2014 2153                                     11/24/2015   Lab Results  Component Value Date   IRON 63 06/12/2016   TIBC 325 06/12/2016   IRONPCTSAT 19 06/12/2016   Lab Results  Component Value Date   FERRITIN 242 02/06/2016       STUDIES: Ct Angio Chest W/cm &/or Wo Cm  12/07/2015  CLINICAL DATA:  Pt c/o bilateral lower extremity swelling with mediastinal chest pain x 2-3 weeks. NKI. EXAM: CT ANGIOGRAPHY CHEST WITH CONTRAST TECHNIQUE: Multidetector CT imaging of the chest was performed using the standard protocol during bolus administration of intravenous contrast. Multiplanar CT image reconstructions and MIPs were obtained to evaluate the vascular anatomy. CONTRAST:  100 mL Isovue 370 COMPARISON:  04/05/2015, 02/27/2013 FINDINGS: There is adequate opacification of the pulmonary arteries. There is no pulmonary embolus. The main pulmonary artery, right main pulmonary artery and left main pulmonary arteries are normal in size. The heart size is normal. There is no pericardial effusion. 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 10 x 8 mm ground-glass left upper lobe pulmonary nodule (image 22/ series 6). At 9 x 8 mm ground-glass left upper lobe pulmonary nodule (image 22/ series 6). There is no focal consolidation, pleural effusion or pneumothorax. There is no axillary, hilar, or mediastinal adenopathy. There is no lytic or blastic osseous lesion. The visualized portions of the upper abdomen are unremarkable. 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 Review of the MIP images confirms the above  findings. IMPRESSION: 1. No evidence of  pulmonary embolus. 2. No acute cardiopulmonary disease. 3. Two stable ground-glass left upper lobe pulmonary nodules unchanged compared with 02/27/2013. Electronically Signed   By: Kathreen Devoid   On: 12/07/2015 13:58    ASSESSMENT: Iron deficiency anemia.  PLAN:    1. Iron deficiency anemia: Likely secondary to poor absorption secondary to history of gastric bypass. Patient's hemoglobin and iron stores are now within normal limits. She does not require additional IV iron. Patient last received Feraheme on January 19, 2016. No further intervention is needed. Return to clinic in 4 months with repeat laboratory work and further evaluation. 2. UTI: Continue Cipro as prescribed by primary care. 3. Vaginal cyst: Patient has an appointment with urology tomorrow for further evaluation.   Patient expressed understanding and was in agreement with this plan. She also understands that She can call clinic at any time with any questions, concerns, or complaints.   Lloyd Huger, MD   01/01/2016 7:25 AM

## 2016-06-12 ENCOUNTER — Inpatient Hospital Stay (HOSPITAL_BASED_OUTPATIENT_CLINIC_OR_DEPARTMENT_OTHER): Payer: Medicare Other | Admitting: Oncology

## 2016-06-12 ENCOUNTER — Inpatient Hospital Stay: Payer: Medicare Other

## 2016-06-12 ENCOUNTER — Inpatient Hospital Stay: Payer: Medicare Other | Attending: Oncology | Admitting: *Deleted

## 2016-06-12 VITALS — BP 147/84 | HR 76 | Temp 98.2°F | Resp 18 | Wt 241.0 lb

## 2016-06-12 DIAGNOSIS — G2581 Restless legs syndrome: Secondary | ICD-10-CM | POA: Insufficient documentation

## 2016-06-12 DIAGNOSIS — F329 Major depressive disorder, single episode, unspecified: Secondary | ICD-10-CM | POA: Diagnosis not present

## 2016-06-12 DIAGNOSIS — R05 Cough: Secondary | ICD-10-CM

## 2016-06-12 DIAGNOSIS — Z79899 Other long term (current) drug therapy: Secondary | ICD-10-CM | POA: Insufficient documentation

## 2016-06-12 DIAGNOSIS — D509 Iron deficiency anemia, unspecified: Secondary | ICD-10-CM

## 2016-06-12 DIAGNOSIS — M129 Arthropathy, unspecified: Secondary | ICD-10-CM | POA: Insufficient documentation

## 2016-06-12 DIAGNOSIS — I10 Essential (primary) hypertension: Secondary | ICD-10-CM | POA: Diagnosis not present

## 2016-06-12 DIAGNOSIS — E1140 Type 2 diabetes mellitus with diabetic neuropathy, unspecified: Secondary | ICD-10-CM | POA: Diagnosis not present

## 2016-06-12 DIAGNOSIS — D508 Other iron deficiency anemias: Secondary | ICD-10-CM

## 2016-06-12 DIAGNOSIS — M4802 Spinal stenosis, cervical region: Secondary | ICD-10-CM

## 2016-06-12 DIAGNOSIS — R269 Unspecified abnormalities of gait and mobility: Secondary | ICD-10-CM | POA: Insufficient documentation

## 2016-06-12 DIAGNOSIS — E669 Obesity, unspecified: Secondary | ICD-10-CM

## 2016-06-12 DIAGNOSIS — I447 Left bundle-branch block, unspecified: Secondary | ICD-10-CM | POA: Diagnosis not present

## 2016-06-12 DIAGNOSIS — Z8042 Family history of malignant neoplasm of prostate: Secondary | ICD-10-CM | POA: Insufficient documentation

## 2016-06-12 DIAGNOSIS — G4730 Sleep apnea, unspecified: Secondary | ICD-10-CM | POA: Diagnosis not present

## 2016-06-12 DIAGNOSIS — K222 Esophageal obstruction: Secondary | ICD-10-CM

## 2016-06-12 DIAGNOSIS — R42 Dizziness and giddiness: Secondary | ICD-10-CM | POA: Diagnosis not present

## 2016-06-12 DIAGNOSIS — K589 Irritable bowel syndrome without diarrhea: Secondary | ICD-10-CM | POA: Diagnosis not present

## 2016-06-12 DIAGNOSIS — E119 Type 2 diabetes mellitus without complications: Secondary | ICD-10-CM | POA: Insufficient documentation

## 2016-06-12 DIAGNOSIS — R1900 Intra-abdominal and pelvic swelling, mass and lump, unspecified site: Secondary | ICD-10-CM

## 2016-06-12 LAB — CBC WITH DIFFERENTIAL/PLATELET
Basophils Absolute: 0.1 10*3/uL (ref 0–0.1)
Basophils Relative: 1 %
Eosinophils Absolute: 0.2 10*3/uL (ref 0–0.7)
Eosinophils Relative: 2 %
HCT: 42.1 % (ref 35.0–47.0)
Hemoglobin: 14 g/dL (ref 12.0–16.0)
Lymphocytes Relative: 26 %
Lymphs Abs: 2.5 10*3/uL (ref 1.0–3.6)
MCH: 29.4 pg (ref 26.0–34.0)
MCHC: 33.2 g/dL (ref 32.0–36.0)
MCV: 88.6 fL (ref 80.0–100.0)
Monocytes Absolute: 0.4 10*3/uL (ref 0.2–0.9)
Monocytes Relative: 5 %
Neutro Abs: 6.3 10*3/uL (ref 1.4–6.5)
Neutrophils Relative %: 66 %
Platelets: 305 10*3/uL (ref 150–440)
RBC: 4.76 MIL/uL (ref 3.80–5.20)
RDW: 14.4 % (ref 11.5–14.5)
WBC: 9.5 10*3/uL (ref 3.6–11.0)

## 2016-06-12 LAB — IRON AND TIBC
Iron: 63 ug/dL (ref 28–170)
Saturation Ratios: 19 % (ref 10.4–31.8)
TIBC: 325 ug/dL (ref 250–450)
UIBC: 262 ug/dL

## 2016-06-12 LAB — FERRITIN: Ferritin: 117 ng/mL (ref 11–307)

## 2016-06-12 NOTE — Progress Notes (Signed)
Offers no complaints. Has been taking antibiotic for the last few days for nasal congestion/cough.

## 2016-06-17 NOTE — Progress Notes (Signed)
[place]  Telephone:(336) [phone] Fax:(336) [phone]  ID: [name] OB: 12/11/1945  MR#: [id number]  XYV#:[id number]  Patient Care Team: [name], MD as PCP - General (Internal Medicine) [facility] as Referring Physician  CHIEF COMPLAINT: Iron deficiency anemia.  INTERVAL HISTORY: Patient returns to clinic today for repeat laboratory work and further evaluation. She has had increased congestion and cough recently and was given antibiotic by her primary care physician. She does not complain of weakness or fatigue today. She has no neurologic complaints. She denies any recent fevers or illnesses. She has a good appetite and denies weight loss. She denies any chest pain or shortness of breath. She denies any nausea, vomiting, constipation, or diarrhea. She has no melena or hematochezia. She has no urinary complaints. Patient otherwise feels well and offers no further specific complaints today.  REVIEW OF SYSTEMS:   Review of Systems  Constitutional: Negative.  Negative for fever, malaise/fatigue and weight loss.  Respiratory: Positive for cough and shortness of breath.   Cardiovascular: Negative.  Negative for chest pain and leg swelling.  Gastrointestinal: Negative.  Negative for abdominal pain, blood in stool and melena.  Genitourinary: Negative.   Musculoskeletal: Negative.   Neurological: Negative.  Negative for weakness.  Psychiatric/Behavioral: Negative.  The patient is not nervous/anxious.     As per HPI. Otherwise, a complete review of systems is negative.  PAST MEDICAL HISTORY: Past Medical History:  Diagnosis Date  . Abnormality of gait 03/25/2013  . Adrenal mass ([place])   . Adrenal mass, left ([place])   . Anemia    iron deficient post  2 unit txfsn 2009, normal endo/colonoscopy by [facility]  . Apnea   . Arthritis   . Cervical spinal stenosis 1994   due to trauma to back ([employer] accident), has intermittent paralysis and parasthesias  .  Cervicogenic headache 03/23/2014  . Depression   . Diabetes mellitus   . Dizziness    chronic dizziness  . Esophageal stenosis March 2011   with transietn outlet obstruction by food, cleared by EGD   . Headache(784.0)   . Hypertension   . IBS (irritable bowel syndrome)   . LBBB (left bundle branch block)   . Obesity   . Obstructive sleep apnea    using CPAP  . 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 Restless leg syndrome   . Syncope and collapse 03/12/2014    PAST SURGICAL HISTORY: Past Surgical History:  Procedure Laterality Date  . ABDOMINAL HYSTERECTOMY    . APPENDECTOMY    . DIAPHRAGMATIC HERNIA REPAIR  2015  . [place]  . GALLBLADDER SURGERY  resection  . GASTRIC BYPASS    . GASTRIC BYPASS  2000, 2005   Dr. Debroah Loop  . [facility] IMPLANT PLACEMENT  April 2013   [place]  . JOINT REPLACEMENT  2007   bilateral knee. [name],  [name]  . ROTATOR CUFF REPAIR     right  . [place]  . TOTAL ABDOMINAL HYSTERECTOMY W/ BILATERAL SALPINGOOPHORECTOMY  1974  . UMBILICAL HERNIA REPAIR  Aug 11, 2015    FAMILY HISTORY: Family History  Problem Relation Age of Onset  . Heart disease Father   . Hypertension Father   . Prostate cancer Father   . Stroke Father   . Osteoporosis Father   . Stroke Mother   . Depression Mother   . Headache Mother   . Heart disease Mother   . Thyroid disease Mother   .  Hypertension Mother   . Diabetes Daughter   . Heart disease Daughter   . Hypertension Daughter   . Hypertension Son     ADVANCED DIRECTIVES (Y/N):  N  HEALTH MAINTENANCE: Social History  Substance Use Topics  . Smoking status: Never Smoker  . Smokeless tobacco: Never Used  . Alcohol use No     Colonoscopy:  PAP:  Bone density:  Lipid panel:  Allergies  Allergen Reactions  . Diovan [Valsartan]   . Erythromycin   . Flagyl [Metronidazole]   . Glucophage [Metformin Hcl]   . Xanax [Alprazolam]   . Amoxicillin Diarrhea  .  Demeclocycline Hives  . Erythromycin Base   . Metformin   . Sulfa Antibiotics Rash    As child  . Tetracyclines & Related Hives and Rash    Current Outpatient Prescriptions  Medication Sig Dispense Refill  . albuterol (PROVENTIL HFA;VENTOLIN HFA) 108 (90 Base) MCG/ACT inhaler Inhale 2 puffs into the lungs every 8 (eight) hours as needed for wheezing or shortness of breath. 1 Inhaler 0  . amLODipine (NORVASC) 5 MG tablet Take 1 tablet (5 mg total) by mouth daily. 90 tablet 0  . azithromycin (ZITHROMAX) 250 MG tablet 2 today then one daily 6 each 0  . cholestyramine light (PREVALITE) 4 GM/DOSE powder Take 4 g by mouth 2 (two) times daily.    . diazepam (VALIUM) 5 MG tablet Take 5 mg by mouth once.    . hydrocodone-acetaminophen (ZYDONE) 5-400 MG tablet Take 1 tablet by mouth every 6 (six) hours as needed for pain.    . hyoscyamine (LEVSIN SL) 0.125 MG SL tablet Place 0.125 mg under the tongue 2 (two) times daily.    [name] levETIRAcetam (KEPPRA) 500 MG tablet Take 1 tablet (500 mg total) by mouth 2 (two) times daily. 60 tablet 3  . LUNESTA 2 MG TABS tablet Take 1 tablet (2 mg total) by mouth at bedtime as needed for sleep. Take immediately before bedtime 90 tablet 1  . ondansetron (ZOFRAN) 4 MG tablet Take 1 tablet (4 mg total) by mouth every 8 (eight) hours as needed for nausea or vomiting. 15 tablet 0  . pregabalin (LYRICA) 100 MG capsule Take 1 capsule (100 mg total) by mouth 2 (two) times daily.    [name] rOPINIRole (REQUIP) 1 MG tablet Take 1 tablet (1 mg total) by mouth at bedtime. 90 tablet 2  . sitaGLIPtin (JANUVIA) 100 MG tablet Take 100 mg by mouth daily.    . traMADol (ULTRAM) 50 MG tablet Take 1 tablet (50 mg total) by mouth 4 (four) times daily as needed. 30 tablet 4  . valACYclovir (VALTREX) 500 MG tablet 4 today, repeat once in 12 hours 8 tablet 1   No current facility-administered medications for this visit.     OBJECTIVE: Vitals:   06/12/16 1346  BP: (!) 147/84  Pulse: 76    Resp: 18  Temp: 98.2 F (36.8 C)     Body mass index is 40.72 kg/m.    ECOG FS:0 - Asymptomatic  General: Well-developed, well-nourished, no acute distress. Eyes: Pink conjunctiva, anicteric sclera. Lungs: Clear to auscultation bilaterally. Heart: Regular rate and rhythm. No rubs, murmurs, or gallops. Abdomen: Soft, nontender, nondistended. No organomegaly noted, normoactive bowel sounds. Musculoskeletal: No edema, cyanosis, or clubbing. Neuro: Alert, answering all questions appropriately. Cranial nerves grossly intact. Skin: No rashes or petechiae noted. Psych: Normal affect.  LAB RESULTS:  Lab Results  Component Value Date   NA 140 04/03/2016   K  4.1 04/03/2016   CL 107 04/03/2016   CO2 25 04/03/2016   GLUCOSE 102 (H) 04/03/2016   BUN 13 04/03/2016   CREATININE 0.92 04/03/2016   CALCIUM 9.0 04/03/2016   PROT 7.8 04/03/2016   ALBUMIN 3.8 04/03/2016   AST 19 04/03/2016   ALT 15 04/03/2016   ALKPHOS 136 (H) 04/03/2016   BILITOT 0.5 04/03/2016   GFRNONAA >60 04/03/2016   GFRAA >60 04/03/2016    Lab Results  Component Value Date   WBC 9.5 06/12/2016   NEUTROABS 6.3 06/12/2016   HGB 14.0 06/12/2016   HCT 42.1 06/12/2016   MCV 88.6 06/12/2016   PLT 305 06/12/2016   Lab Results  Component Value Date   IRON 63 06/12/2016   TIBC 325 06/12/2016   IRONPCTSAT 19 06/12/2016   Lab Results  Component Value Date   FERRITIN 117 06/12/2016     STUDIES: No results found.  ASSESSMENT: Iron deficiency anemia.  PLAN:   1. Iron deficiency anemia: Likely secondary to poor absorption secondary to history of gastric bypass. Patient's hemoglobin and iron stores continue to be within normal limits. She does not require additional IV iron. Patient last received Feraheme on January 19, 2016. No further intervention is needed. Return to clinic in 4 months with repeat laboratory work and further evaluation. 2. Cough/congestion: Continue antibiotics as prescribed by primary care.      Patient expressed understanding and was in agreement with this plan. She also understands that She can call clinic at any time with any questions, concerns, or complaints.    Lloyd Huger, MD   06/17/2016 7:18 AM

## 2016-06-25 ENCOUNTER — Telehealth: Payer: Self-pay | Admitting: Internal Medicine

## 2016-06-25 DIAGNOSIS — R059 Cough, unspecified: Secondary | ICD-10-CM

## 2016-06-25 DIAGNOSIS — R05 Cough: Secondary | ICD-10-CM

## 2016-06-25 MED ORDER — PROMETHAZINE-DM 6.25-15 MG/5ML PO SYRP: 5.0000 mL | ORAL_SOLUTION | Freq: Every evening | ORAL | 1 refills | Status: DC | PRN

## 2016-06-25 NOTE — Telephone Encounter (Signed)
Pt called and stated that she saw M. Arnett on 05/29/16 for cold symptoms and was prescribed a z pak, she has since finished the meds and received another z pak by mail. She is not feeling any better and wants to know if she should start a second round of the z pak. She has a really sore throat and nasal congestion. Please advise, thank you!  Call pt @ [phone] (home) if it goes to machine call [phone]

## 2016-06-25 NOTE — Telephone Encounter (Signed)
Patient has now developed sore throat, coughing and get short of breath with coughing.   Some nasal congestion no fever.since Thursday   She has a refill on Z-pak should she go ahead and take please advise.

## 2016-06-25 NOTE — Telephone Encounter (Signed)
Advised pt conservative treatment for cough. Will give cough syrup, prn. No fever. SOB when coughs. No sinus pressure. Clear runny nasal congestion for past week.

## 2016-06-29 ENCOUNTER — Other Ambulatory Visit: Payer: Medicare Other

## 2016-06-29 ENCOUNTER — Other Ambulatory Visit (INDEPENDENT_AMBULATORY_CARE_PROVIDER_SITE_OTHER): Payer: Medicare Other

## 2016-06-29 DIAGNOSIS — E1142 Type 2 diabetes mellitus with diabetic polyneuropathy: Secondary | ICD-10-CM

## 2016-06-29 DIAGNOSIS — E559 Vitamin D deficiency, unspecified: Secondary | ICD-10-CM | POA: Diagnosis not present

## 2016-06-29 DIAGNOSIS — D508 Other iron deficiency anemias: Secondary | ICD-10-CM

## 2016-06-29 LAB — LIPID PANEL
Cholesterol: 196 mg/dL (ref 0–200)
HDL: 55.8 mg/dL (ref >39.00)
LDL Cholesterol: 105 mg/dL — ABNORMAL HIGH (ref 0–99)
NonHDL: 140.5
Total CHOL/HDL Ratio: 4
Triglycerides: 179 mg/dL — ABNORMAL HIGH (ref 0.0–149.0)
VLDL: 35.8 mg/dL (ref 0.0–40.0)

## 2016-06-29 LAB — COMPREHENSIVE METABOLIC PANEL
ALT: 17 U/L (ref 0–35)
AST: 20 U/L (ref 0–37)
Albumin: 4 g/dL (ref 3.5–5.2)
Alkaline Phosphatase: 145 U/L — ABNORMAL HIGH (ref 39–117)
BUN: 10 mg/dL (ref 6–23)
CO2: 27 mEq/L (ref 19–32)
Calcium: 9 mg/dL (ref 8.4–10.5)
Chloride: 107 mEq/L (ref 96–112)
Creatinine, Ser: 0.83 mg/dL (ref 0.40–1.20)
GFR: 72.08 mL/min (ref >60.00)
Glucose, Bld: 128 mg/dL — ABNORMAL HIGH (ref 70–99)
Potassium: 4.3 mEq/L (ref 3.5–5.1)
Sodium: 145 mEq/L (ref 135–145)
Total Bilirubin: 0.5 mg/dL (ref 0.2–1.2)
Total Protein: 7.1 g/dL (ref 6.0–8.3)

## 2016-06-29 LAB — FERRITIN: Ferritin: 96.9 ng/mL (ref 10.0–291.0)

## 2016-06-29 LAB — MICROALBUMIN / CREATININE URINE RATIO
Creatinine,U: 72 mg/dL
Microalb Creat Ratio: 13.2 mg/g (ref 0.0–30.0)
Microalb, Ur: 9.5 mg/dL — ABNORMAL HIGH (ref 0.0–1.9)

## 2016-06-29 LAB — IBC PANEL
Iron: 77 ug/dL (ref 42–145)
Saturation Ratios: 22.4 % (ref 20.0–50.0)
Transferrin: 245 mg/dL (ref 212.0–360.0)

## 2016-06-29 LAB — VITAMIN D 25 HYDROXY (VIT D DEFICIENCY, FRACTURES): VITD: 17.28 ng/mL — ABNORMAL LOW (ref 30.00–100.00)

## 2016-06-29 LAB — HEMOGLOBIN A1C: Hgb A1c MFr Bld: 7.1 % — ABNORMAL HIGH (ref 4.6–6.5)

## 2016-07-01 ENCOUNTER — Encounter: Payer: Self-pay | Admitting: Internal Medicine

## 2016-07-04 ENCOUNTER — Other Ambulatory Visit: Payer: Medicare Other

## 2016-07-13 ENCOUNTER — Ambulatory Visit (INDEPENDENT_AMBULATORY_CARE_PROVIDER_SITE_OTHER): Payer: Medicare Other | Admitting: Internal Medicine

## 2016-07-13 ENCOUNTER — Encounter: Payer: Self-pay | Admitting: Internal Medicine

## 2016-07-13 VITALS — BP 140/88 | HR 75 | Temp 98.5°F | Ht 65.0 in | Wt 241.8 lb

## 2016-07-13 DIAGNOSIS — F329 Major depressive disorder, single episode, unspecified: Secondary | ICD-10-CM

## 2016-07-13 DIAGNOSIS — E1142 Type 2 diabetes mellitus with diabetic polyneuropathy: Secondary | ICD-10-CM

## 2016-07-13 DIAGNOSIS — G4733 Obstructive sleep apnea (adult) (pediatric): Secondary | ICD-10-CM | POA: Diagnosis not present

## 2016-07-13 DIAGNOSIS — Z9989 Dependence on other enabling machines and devices: Secondary | ICD-10-CM

## 2016-07-13 DIAGNOSIS — F5105 Insomnia due to other mental disorder: Secondary | ICD-10-CM

## 2016-07-13 DIAGNOSIS — F419 Anxiety disorder, unspecified: Secondary | ICD-10-CM

## 2016-07-13 DIAGNOSIS — I872 Venous insufficiency (chronic) (peripheral): Secondary | ICD-10-CM

## 2016-07-13 DIAGNOSIS — I10 Essential (primary) hypertension: Secondary | ICD-10-CM

## 2016-07-13 DIAGNOSIS — R5383 Other fatigue: Secondary | ICD-10-CM

## 2016-07-13 DIAGNOSIS — R601 Generalized edema: Secondary | ICD-10-CM | POA: Diagnosis not present

## 2016-07-13 DIAGNOSIS — F32A Depression, unspecified: Secondary | ICD-10-CM

## 2016-07-13 DIAGNOSIS — D509 Iron deficiency anemia, unspecified: Secondary | ICD-10-CM

## 2016-07-13 MED ORDER — BUPROPION HCL ER (SR) 100 MG PO TB12: 100.0000 mg | ORAL_TABLET | Freq: Two times a day (BID) | ORAL | 0 refills | Status: DC

## 2016-07-13 NOTE — Progress Notes (Addendum)
Subjective:  Patient ID: Denise Sharp, female    DOB: 02/19/1946  Age: 70 y.o. MRN: [id number]  CC: The primary encounter diagnosis was Essential hypertension. 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  HPI Denise Sharp presents for follow up on chronic issues including type 2 DM, IDA and B12 deficiency due to gastric bypass, morbid obesity with OSA and hypersomnolence, and chronic lower extremity edema. '  LE edema: CT chest was done in May to rule out PE. Venous insufficiency was confirmed with evaluation by  Vascular Surgery, and told to wear compression stockings, but has not done so yet. [name]   Has seen NP Joycelyn Schmid 3 times in the last month for  Management of hypertension and bronchitis   Cc:  She has been denied life insurance because of several diagnoses that exist in her medical record, which include but are not limited to the following list.  40 mintues was spent with patient in face to face time reviewing her medical chart and the problems cited as barriers to her obtaining life insurance  :  LBBB (noted by Dr. Fletcher Anon)  seen in 2013. Sept 2013  ECHO showed no wall motion abnormalities, normal EF of 55 to 52% ann no diastolic dysfunction.  Cardiac catheterization in January 2014 showed no significant blockages.  Subsequent EKGs have been normal.    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 Jacqualyn Posey with periodic checkups and use of therapeutic shoes.  Uncontrolled DM: A1c has been < 8.0 for the last several years.   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  Nephropathy. Diabetic.   Anemia:  resolved by recent labs , iron studies normal   Sleep apnea managed by Dr Annamaria Boots  Last titration study was last year.   Using amlodipine for hypertension management Bystolic stopped.  Prn hydralazine not used lately   Low Vit D  Exhausted all the time. Iron studies normal in December. .stressed out by taking care of husband,  Daughter .   [place]..   Falling asleep during the day,  Woken up twice per night for urinary breaks.  Taking lunesta and valium at night to sleep.  Not exercising due to poor balance, stumbles around.  Despite 8 weeks of PT for poor balance.  Discussed seeing ENT and resuming water aerobics. Has reduced lyrica dose to 100 to 200 mg at night  bc of edema    A1c improved.  Drinking less sweet tea, and  eating less.   Discussing treatment of fatigue and depression. Used to see a psychiatrist many years ago and tolerated wellbutrin.   Seeing Shaheen at [facility] for diarrhea .  stooling less,  And stools are formed as a rule with the new medications cholestyramine   Lab Results  Component Value Date   HGBA1C 7.1 (H) 06/29/2016      Outpatient Medications Prior to Visit  Medication Sig Dispense Refill  . albuterol (PROVENTIL HFA;VENTOLIN HFA) 108 (90 Base) MCG/ACT inhaler Inhale 2 puffs into the lungs every 8 (eight) hours as needed for wheezing or shortness of breath. 1 Inhaler 0  . amLODipine (NORVASC) 5 MG tablet Take 1 tablet (5 mg total) by mouth daily. 90 tablet 0  .  cholestyramine light (PREVALITE) 4 GM/DOSE powder Take 4 g by mouth 2 (two) times daily.    . hydrocodone-acetaminophen (ZYDONE) 5-400 MG tablet Take 1 tablet by mouth every 6 (six) hours as needed for pain.    . hyoscyamine (LEVSIN SL) 0.125 MG SL tablet Place 0.125 mg under the tongue 2 (two) times daily.    [name] levETIRAcetam (KEPPRA) 500 MG tablet Take 1 tablet (500 mg total) by mouth 2 (two) times daily. 60 tablet 3  . LUNESTA 2 MG TABS tablet Take 1 tablet (2 mg total) by mouth at bedtime as needed for  sleep. Take immediately before bedtime 90 tablet 1  . ondansetron (ZOFRAN) 4 MG tablet Take 1 tablet (4 mg total) by mouth every 8 (eight) hours as needed for nausea or vomiting. 15 tablet 0  . pregabalin (LYRICA) 100 MG capsule Take 1 capsule (100 mg total) by mouth 2 (two) times daily.    . promethazine-dextromethorphan (PROMETHAZINE-DM) 6.25-15 MG/5ML syrup Take 5 mLs by mouth at bedtime as needed for cough. 40 mL 1  . sitaGLIPtin (JANUVIA) 100 MG tablet Take 100 mg by mouth daily.    . traMADol (ULTRAM) 50 MG tablet Take 1 tablet (50 mg total) by mouth 4 (four) times daily as needed. 30 tablet 4  . valACYclovir (VALTREX) 500 MG tablet 4 today, repeat once in 12 hours 8 tablet 1  . azithromycin (ZITHROMAX) 250 MG tablet 2 today then one daily 6 each 0  . diazepam (VALIUM) 5 MG tablet Take 5 mg by mouth once.    [name] rOPINIRole (REQUIP) 1 MG tablet Take 1 tablet (1 mg total) by mouth at bedtime. 90 tablet 2   No facility-administered medications prior to visit.     Review of Systems;  Patient denies headache, fevers, malaise, unintentional weight loss, skin rash, eye pain, sinus congestion and sinus pain, sore throat, dysphagia,  hemoptysis , cough, dyspnea, wheezing, chest pain, palpitations, orthopnea, edema, abdominal pain, nausea, melena, diarrhea, constipation, flank pain, dysuria, hematuria, urinary  Frequency, nocturia, numbness, tingling, seizures,  Focal weakness, Loss of consciousness,  Tremor, insomnia, depression, anxiety, and suicidal ideation.      Objective:  BP 140/88   Pulse 75   Temp 98.5 F (36.9 C) (Oral)   Ht 5\' 5"  (1.651 m)   Wt 241 lb 12.8 oz (109.7 kg)   PF 98 L/min   BMI 40.24 kg/m   BP Readings from Last 3 Encounters:  08/03/16 128/63  07/13/16 140/88  06/12/16 (!) 147/84    Wt Readings from Last 3 Encounters:  08/03/16 241 lb (109.3 kg)  07/13/16 241 lb 12.8 oz (109.7 kg)  06/12/16 240 lb 15.4 oz (109.3 kg)    General appearance: alert, cooperative  and appears stated age Ears: normal TM's and external ear canals both ears Throat: lips, mucosa, and tongue normal; teeth and gums normal Neck: no adenopathy, no carotid bruit, supple, symmetrical, trachea midline and thyroid not enlarged, symmetric, no tenderness/mass/nodules Back: symmetric, no curvature. ROM normal. No CVA tenderness. Lungs: clear to auscultation bilaterally Heart: regular rate and rhythm, S1, S2 normal, no murmur, click, rub or gallop Abdomen: soft, non-tender; bowel sounds normal; no masses,  no organomegaly Pulses: 2+ and symmetric Skin: Skin color, texture, turgor normal. No rashes or lesions Lymph nodes: Cervical, supraclavicular, and axillary nodes normal.  Lab Results  Component Value Date   HGBA1C 7.1 (H) 06/29/2016   HGBA1C 7.3 (H) 12/02/2015   HGBA1C 7.1 (H) 06/16/2015    Lab Results  Component Value Date   CREATININE 1.10 (H) 08/03/2016   CREATININE 0.83 06/29/2016   CREATININE 0.92 04/03/2016    Lab Results  Component Value Date   WBC 6.8 08/03/2016   HGB 13.5 08/03/2016   HCT 40.9 08/03/2016   PLT 213 08/03/2016   GLUCOSE 139 (H) 08/03/2016   CHOL 196 06/29/2016   TRIG 179.0 (H) 06/29/2016   HDL 55.80 06/29/2016   LDLDIRECT 111.0 06/16/2015   LDLCALC 105 (H) 06/29/2016   ALT 25 08/03/2016   AST 37 08/03/2016   NA 140 08/03/2016   K 3.1 (L) 08/03/2016   CL 105 08/03/2016   CREATININE 1.10 (H) 08/03/2016   BUN 18 08/03/2016   CO2 26 08/03/2016   TSH 4.400 06/16/2015   HGBA1C 7.1 (H) 06/29/2016   MICROALBUR 9.5 (H) 06/29/2016    Ct Abdomen Pelvis W Contrast  Result Date: 04/03/2016 CLINICAL DATA:  Acute onset of generalized weakness and severe diarrhea. Initial encounter. EXAM: CT ABDOMEN AND PELVIS WITH CONTRAST TECHNIQUE: Multidetector CT imaging of the abdomen and pelvis was performed using the standard protocol following bolus administration of intravenous contrast. CONTRAST:  187mL ISOVUE-300 IOPAMIDOL (ISOVUE-300) INJECTION 61%  COMPARISON:  CT of the abdomen and pelvis from 12/03/2014 FINDINGS: Lower chest: Minimal bibasilar atelectasis is noted. Mild calcification is noted at the mitral valve. Hepatobiliary: The liver is unremarkable in appearance. The patient is status post cholecystectomy, with clips noted at the gallbladder fossa. The common bile duct remains normal in caliber. Pancreas: The pancreas is within normal limits. Spleen: The spleen is unremarkable in appearance. Adrenals/Urinary Tract: The adrenal glands are unremarkable in appearance. Bilateral parapelvic renal cysts are noted. There is no evidence of hydronephrosis. No renal or ureteral stones are identified. No perinephric stranding is seen. Stomach/Bowel: The patient is status post gastric bypass surgery with gastrojejunostomy. The gastrojejunostomy is unremarkable in appearance. The distal stomach contains a small amount of fluid. The small bowel is within normal limits. The patient is status post appendectomy. The colon is unremarkable in appearance. Vascular/Lymphatic: Mild scattered calcification is seen along the abdominal aorta and its branches. The abdominal aorta is otherwise grossly unremarkable. The inferior vena cava is grossly unremarkable. No retroperitoneal lymphadenopathy is seen. No pelvic sidewall lymphadenopathy is identified. Reproductive: The bladder is mildly distended and grossly unremarkable. The patient is status post hysterectomy. No suspicious adnexal masses are seen. Other: A metallic device is noted at the left flank, with a single lead ending at the right hemipelvis. Postoperative change is noted along the anterior midline abdominal wall, with mild associated calcification. Musculoskeletal: No acute osseous abnormalities are identified. Vacuum phenomenon and endplate sclerotic change are noted at L3-L4 and L4-L5. The visualized musculature is unremarkable in appearance. IMPRESSION: 1. No acute abnormality seen to explain the patient's  symptoms. 2. Gastrojejunostomy is unremarkable in appearance. 3. Mild scattered calcification along the abdominal aorta and its branches. 4. Bilateral parapelvic renal cysts noted. Electronically Signed   By: Garald Balding M.D.   On: 04/03/2016 22:20    Assessment & Plan:   Problem List Items Addressed This Visit    DM type 2 with diabetic peripheral neuropathy ([place])    Improved control with dietary changes.  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 Jacqualyn Posey with periodic checkups and use of therapeutic shoes.  She is requesting new shoes today/annually   Lab Results  Component Value Date   HGBA1C 7.1 (H) 06/29/2016   Lab Results  Component Value Date   MICROALBUR 9.5 (H) 06/29/2016         Relevant Medications   buPROPion (WELLBUTRIN SR) 100 MG 12 hr tablet   Edema    Appears to be multifactorial including venous insufficiency and medication related (amlodipine and Lyrica) .  Changing medications may be  problematic due to  medication intolerances but will consider  Change to an ACE inhibitor if use of compression stockings is not ameliorating.       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  She has no history of seizures, but takes Keppra for unclear reasons  Prescribed by Dr Floyde Parkins       Hypertension - Primary    Improved with addition of amlodipine to bystolic.       Relevant Orders   DME Other see comment   Insomnia secondary to anxiety    Will consider trial of Belsomra if insomnia and daytime hypersomnolence persist      Relevant Medications   buPROPion (WELLBUTRIN SR) 100 MG 12 hr tablet   Iron deficiency anemia    Resolved , secondary  with multiple GI sources.  Lab Results  Component Value Date   WBC 9.5 06/12/2016   HGB 14.0 06/12/2016   HCT 42.1 06/12/2016   MCV 88.6 06/12/2016   PLT 305 06/12/2016   Lab Results  Component Value Date   FERRITIN  96.9 06/29/2016   Lab Results  Component Value Date   IRON 77 06/29/2016   TIBC 325 06/12/2016   FERRITIN 96.9 06/29/2016         OSA on CPAP    Managed by Dr Annamaria Boots.  CPAP setting of 6 cm H 20       Other Visit Diagnoses    Chronic venous insufficiency       Relevant Orders   DME Other see comment      I am having Denise Sharp start on buPROPion. I am also having her maintain her LUNESTA, traMADol, pregabalin, levETIRAcetam, ondansetron, hydrocodone-acetaminophen, sitaGLIPtin, cholestyramine light, hyoscyamine, albuterol, amLODipine, valACYclovir, and promethazine-dextromethorphan.  Meds ordered this encounter  Medications  . buPROPion (WELLBUTRIN SR) 100 MG 12 hr tablet    Sig: Take 1 tablet (100 mg total) by mouth 2 (two) times daily.    Dispense:  60 tablet    Refill:  0    There are no discontinued medications.  Follow-up: Return in about 3 months (around 10/11/2016) for follow up diabetes.   [name], MD

## 2016-07-13 NOTE — Patient Instructions (Addendum)
Go to [facility] for your 2 prs of compression stockings,  Let them measure and fit you   You Must start exercising using water aerobics to strenghten your muscles  Your Vitamin D was low.  Continue 50,00 ) units weekly for the entire winter  Starting wellbutrin at 100 mg in the  Morning ,  Second dose by 3 pm to avoid aggravating your insomnia

## 2016-07-13 NOTE — Progress Notes (Signed)
Pre visit review using our clinic review tool, if applicable. No additional management support is needed unless otherwise documented below in the visit note. 

## 2016-07-13 NOTE — Assessment & Plan Note (Signed)
Managed by Dr Annamaria Boots.  CPAP setting of 6 cm H 20

## 2016-07-16 NOTE — Assessment & Plan Note (Addendum)
Will consider trial of Belsomra if insomnia and daytime hypersomnolence persist

## 2016-07-16 NOTE — Assessment & Plan Note (Signed)
Appears to be multifactorial including venous insufficiency and medication related (amlodipine and Lyrica) .  Changing medications may be  problematic due to  medication intolerances but will consider  Change to an ACE inhibitor if use of compression stockings is not ameliorating.

## 2016-07-16 NOTE — Assessment & Plan Note (Signed)
Discussed resuming a trial of wellbutrin .  She has no history of seizures, but takes Keppra for unclear reasons  Prescribed by Dr Floyde Parkins

## 2016-07-16 NOTE — Assessment & Plan Note (Signed)
Improved with addition of amlodipine to bystolic.

## 2016-07-16 NOTE — Assessment & Plan Note (Signed)
Resolved , secondary  with multiple GI sources.  Lab Results  Component Value Date   WBC 9.5 06/12/2016   HGB 14.0 06/12/2016   HCT 42.1 06/12/2016   MCV 88.6 06/12/2016   PLT 305 06/12/2016   Lab Results  Component Value Date   FERRITIN 96.9 06/29/2016   Lab Results  Component Value Date   IRON 77 06/29/2016   TIBC 325 06/12/2016   FERRITIN 96.9 06/29/2016

## 2016-07-16 NOTE — Assessment & Plan Note (Addendum)
Improved control with dietary changes.  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 Jacqualyn Posey with periodic checkups and use of therapeutic shoes.  She is requesting new shoes today/annually   Lab Results  Component Value Date   HGBA1C 7.1 (H) 06/29/2016   Lab Results  Component Value Date   MICROALBUR 9.5 (H) 06/29/2016

## 2016-07-17 NOTE — Telephone Encounter (Signed)
Mailed unread message to patient.  

## 2016-07-18 ENCOUNTER — Telehealth: Payer: Self-pay | Admitting: Neurology

## 2016-07-18 NOTE — Telephone Encounter (Signed)
I called patient. She is on Keppra for her peripheral neuropathy, not because of seizures, no contraindication to using Wellbutrin.

## 2016-07-18 NOTE — Telephone Encounter (Signed)
Pt called says PCP wants to start her on Wellbutrin but she will need clearance if she takes it with levETIRAcetam (KEPPRA) 500 MG tablet Dr Viona Gilmore has prescribed her or does she need to take it all. In talking with her she discovered she does not have levETIRAcetam (KEPPRA) 500 MG tablet at all since it was sent to [facility] instead of [facility],  she advised all meds should go thru [facility]. She said if Dr Viona Gilmore wants her to start taking it to call 1 month to [facility]. She is still taking lyrica. Please call

## 2016-07-23 DIAGNOSIS — Z6841 Body Mass Index (BMI) 40.0 and over, adult: Secondary | ICD-10-CM | POA: Diagnosis not present

## 2016-07-23 DIAGNOSIS — R197 Diarrhea, unspecified: Secondary | ICD-10-CM | POA: Diagnosis not present

## 2016-07-31 DIAGNOSIS — E113393 Type 2 diabetes mellitus with moderate nonproliferative diabetic retinopathy without macular edema, bilateral: Secondary | ICD-10-CM | POA: Diagnosis not present

## 2016-08-03 ENCOUNTER — Emergency Department: Payer: Medicare Other

## 2016-08-03 ENCOUNTER — Emergency Department
Admission: EM | Admit: 2016-08-03 | Discharge: 2016-08-03 | Disposition: A | Discharge status: Home / Self Care | Payer: Medicare Other | Attending: Emergency Medicine | Admitting: Emergency Medicine

## 2016-08-03 DIAGNOSIS — I10 Essential (primary) hypertension: Secondary | ICD-10-CM | POA: Diagnosis not present

## 2016-08-03 DIAGNOSIS — E876 Hypokalemia: Secondary | ICD-10-CM | POA: Insufficient documentation

## 2016-08-03 DIAGNOSIS — R0602 Shortness of breath: Secondary | ICD-10-CM | POA: Diagnosis not present

## 2016-08-03 DIAGNOSIS — I447 Left bundle-branch block, unspecified: Secondary | ICD-10-CM | POA: Insufficient documentation

## 2016-08-03 DIAGNOSIS — R079 Chest pain, unspecified: Secondary | ICD-10-CM | POA: Diagnosis not present

## 2016-08-03 DIAGNOSIS — B349 Viral infection, unspecified: Secondary | ICD-10-CM | POA: Insufficient documentation

## 2016-08-03 DIAGNOSIS — R05 Cough: Secondary | ICD-10-CM | POA: Diagnosis not present

## 2016-08-03 DIAGNOSIS — E11319 Type 2 diabetes mellitus with unspecified diabetic retinopathy without macular edema: Secondary | ICD-10-CM | POA: Diagnosis not present

## 2016-08-03 DIAGNOSIS — G4730 Sleep apnea, unspecified: Secondary | ICD-10-CM | POA: Diagnosis not present

## 2016-08-03 DIAGNOSIS — E119 Type 2 diabetes mellitus without complications: Secondary | ICD-10-CM | POA: Diagnosis not present

## 2016-08-03 DIAGNOSIS — R059 Cough, unspecified: Secondary | ICD-10-CM

## 2016-08-03 LAB — COMPREHENSIVE METABOLIC PANEL
ALT: 25 U/L (ref 14–54)
AST: 37 U/L (ref 15–41)
Albumin: 3.3 g/dL — ABNORMAL LOW (ref 3.5–5.0)
Alkaline Phosphatase: 100 U/L (ref 38–126)
Anion gap: 9 (ref 5–15)
BUN: 18 mg/dL (ref 6–20)
CO2: 26 mmol/L (ref 22–32)
Calcium: 8.3 mg/dL — ABNORMAL LOW (ref 8.9–10.3)
Chloride: 105 mmol/L (ref 101–111)
Creatinine, Ser: 1.1 mg/dL — ABNORMAL HIGH (ref 0.44–1.00)
GFR calc Af Amer: 58 mL/min — ABNORMAL LOW (ref >60)
GFR calc non Af Amer: 50 mL/min — ABNORMAL LOW (ref >60)
Glucose, Bld: 139 mg/dL — ABNORMAL HIGH (ref 65–99)
Potassium: 3.1 mmol/L — ABNORMAL LOW (ref 3.5–5.1)
Sodium: 140 mmol/L (ref 135–145)
Total Bilirubin: 0.5 mg/dL (ref 0.3–1.2)
Total Protein: 6.9 g/dL (ref 6.5–8.1)

## 2016-08-03 LAB — CBC WITH DIFFERENTIAL/PLATELET
Basophils Absolute: 0 10*3/uL (ref 0–0.1)
Basophils Relative: 1 %
Eosinophils Absolute: 0.1 10*3/uL (ref 0–0.7)
Eosinophils Relative: 1 %
HCT: 40.9 % (ref 35.0–47.0)
Hemoglobin: 13.5 g/dL (ref 12.0–16.0)
Lymphocytes Relative: 40 %
Lymphs Abs: 2.7 10*3/uL (ref 1.0–3.6)
MCH: 29.6 pg (ref 26.0–34.0)
MCHC: 33.1 g/dL (ref 32.0–36.0)
MCV: 89.4 fL (ref 80.0–100.0)
Monocytes Absolute: 0.8 10*3/uL (ref 0.2–0.9)
Monocytes Relative: 12 %
Neutro Abs: 3.2 10*3/uL (ref 1.4–6.5)
Neutrophils Relative %: 46 %
Platelets: 213 10*3/uL (ref 150–440)
RBC: 4.57 MIL/uL (ref 3.80–5.20)
RDW: 15.1 % — ABNORMAL HIGH (ref 11.5–14.5)
WBC: 6.8 10*3/uL (ref 3.6–11.0)

## 2016-08-03 LAB — TROPONIN I: Troponin I: <0.03 ng/mL

## 2016-08-03 MED ORDER — HYDROCODONE-ACETAMINOPHEN 5-325 MG PO TABS
1.0000 | ORAL_TABLET | Freq: Once | ORAL | Status: AC
  Administered 2016-08-03: 1 via ORAL
  Filled 2016-08-03: qty 1

## 2016-08-03 MED ORDER — BENZONATATE 100 MG PO CAPS
200.0000 mg | ORAL_CAPSULE | Freq: Once | ORAL | Status: AC
  Administered 2016-08-03: 200 mg via ORAL
  Filled 2016-08-03: qty 2

## 2016-08-03 MED ORDER — POTASSIUM CHLORIDE CRYS ER 20 MEQ PO TBCR
40.0000 meq | EXTENDED_RELEASE_TABLET | Freq: Once | ORAL | Status: AC
  Administered 2016-08-03: 40 meq via ORAL
  Filled 2016-08-03: qty 2

## 2016-08-03 NOTE — ED Triage Notes (Signed)
Pt to triage via w/c with no distress noted; pt reports x month having prod cough yellow sputum with no dx; taking OTC antihistamines without relief inc codeine cough syrup; since Tuesday having wheezing & [facility]

## 2016-08-03 NOTE — ED Provider Notes (Signed)
[facility] Emergency Department Provider Note  ____________________________________________   First MD Initiated Contact with Patient 08/03/16 [phone]     (approximate)  I have reviewed the triage vital signs and the nursing notes.   HISTORY  Chief Complaint Shortness of Breath    HPI Denise Sharp is a 71 y.o. female With a history of OSA obesity but no prior history of asthma or COPDpresents for evaluation of cough 1 month that has been persistent and is frustrating her.  She states that she hadmptoms about a month ago including ear pain and sat in the nonproductive cough assisted for about 3 weeks.  Last week she saw her primary care doctor, [name], who gave her some cough medicine and checked labs which were all normal.  She states that she has been using the cough medicine but nothing is helping.  She has used multiple different over-the-counter treatments as well and nothing is working.  Her cough is not getting worse although sometimes it is now productive of yellow sputum.  She denies chest pain, nausea, vomiting, abdominal pain, dysuria. symptoms feel short of breath particularly when she starts coughing heavily.  She has been using her CPAP at night.   Past Medical History:  Diagnosis Date  . Abnormality of gait 03/25/2013  . Adrenal mass ([place])   . Adrenal mass, left ([place])   . Anemia    iron deficient post  2 unit txfsn 2009, normal endo/colonoscopy by [facility]  . Apnea   . Arthritis   . Cervical spinal stenosis 1994   due to trauma to back ([employer] accident), has intermittent paralysis and parasthesias  . Cervicogenic headache 03/23/2014  . Depression   . Diabetes mellitus   . Dizziness    chronic dizziness  . Esophageal stenosis March 2011   with transietn outlet obstruction by food, cleared by EGD   . Headache(784.0)   . Hypertension   . IBS (irritable bowel syndrome)   . LBBB (left bundle branch block)   . Obesity   . Obstructive  sleep apnea    using CPAP  . 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 Restless leg syndrome   . Syncope and collapse 03/12/2014    Patient Active Problem List   Diagnosis Date Noted  . Fever blister 06/10/2016  . Edema 12/13/2015  . Pain and swelling of right lower leg 12/02/2015  . Hypersomnia, persistent 06/18/2015  . Hypothyroidism 10/31/2014  . Candida rash of groin 10/31/2014  . Mechanical complication of nervous system device ([place]) 10/16/2014  . Acute bronchitis 08/14/2014  . Obesity hypoventilation syndrome ([place]) 08/13/2014  . Frequent falls 06/23/2014  . Chronic cough 05/05/2014  . Adenoma of left adrenal gland 03/24/2014  . Cervicogenic headache 03/23/2014  . Syncope and collapse 03/12/2014  . Vitamin D deficiency 01/06/2014  . Abdominal apron 12/23/2013  . Rectal bleeding 12/03/2013  . Weight gain 12/03/2013  . Obesity 08/25/2013  . Fatigue due to depression 08/04/2013  . Diaphragmatic hernia 04/06/2013  . Diabetic polyneuropathy ([place]) 03/25/2013  . Abnormality of gait 03/25/2013  . Multiple pulmonary nodules 03/20/2013  . SOB (shortness of breath) 03/20/2013  . Anemia 08/06/2012  . Iron deficiency anemia 08/06/2012  . Disorder of bladder function 07/15/2012  . Incomplete bladder emptying 07/15/2012  . Urge incontinence of urine 07/15/2012  . FOM (frequency of micturition) 07/15/2012  . OSA on CPAP 03/02/2012  . Insomnia secondary to anxiety 02/29/2012  . Sciatica 09/05/2011  . Cervical stenosis of spinal canal  06/14/2011  . Restless legs syndrome 03/13/2011  . Degenerative disk disease 03/13/2011  . Hypertension 03/12/2011  . 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 Hearing loss, right 03/12/2011    Past Surgical History:  Procedure Laterality Date  . ABDOMINAL HYSTERECTOMY    . APPENDECTOMY    . DIAPHRAGMATIC HERNIA REPAIR  2015  . [place]  . GALLBLADDER SURGERY  resection  . GASTRIC BYPASS    .  GASTRIC BYPASS  2000, 2005   Dr. Debroah Loop  . [facility] IMPLANT PLACEMENT  April 2013   [place]  . JOINT REPLACEMENT  2007   bilateral knee. [name],  [name]  . ROTATOR CUFF REPAIR     right  . [place]  . TOTAL ABDOMINAL HYSTERECTOMY W/ BILATERAL SALPINGOOPHORECTOMY  1974  . UMBILICAL HERNIA REPAIR  Aug 11, 2015    Prior to Admission medications   Medication Sig Start Date End Date Taking? Authorizing Provider  albuterol (PROVENTIL HFA;VENTOLIN HFA) 108 (90 Base) MCG/ACT inhaler Inhale 2 puffs into the lungs every 8 (eight) hours as needed for wheezing or shortness of breath. 05/29/16   Burnard Hawthorne, FNP  amLODipine (NORVASC) 5 MG tablet Take 1 tablet (5 mg total) by mouth daily. 05/29/16   Burnard Hawthorne, FNP  azithromycin (ZITHROMAX) 250 MG tablet 2 today then one daily 06/08/16   Deneise Lever, MD  buPROPion [facility] SR) 100 MG 12 hr tablet Take 1 tablet (100 mg total) by mouth 2 (two) times daily. 07/13/16   [name], MD  cholestyramine light (PREVALITE) 4 GM/DOSE powder Take 4 g by mouth 2 (two) times daily. 05/14/16   Historical Provider, MD  diazepam (VALIUM) 5 MG tablet Take 5 mg by mouth once.    Historical Provider, MD  hydrocodone-acetaminophen (ZYDONE) 5-400 MG tablet Take 1 tablet by mouth every 6 (six) hours as needed for pain.    Historical Provider, MD  hyoscyamine (LEVSIN SL) 0.125 MG SL tablet Place 0.125 mg under the tongue 2 (two) times daily. 05/14/16   Historical Provider, MD  levETIRAcetam (KEPPRA) 500 MG tablet Take 1 tablet (500 mg total) by mouth 2 (two) times daily. 03/22/16   Kathrynn Ducking, MD  LUNESTA 2 MG TABS tablet Take 1 tablet (2 mg total) by mouth at bedtime as needed for sleep. Take immediately before bedtime 02/08/16   [name], MD  ondansetron (ZOFRAN) 4 MG tablet Take 1 tablet (4 mg total) by mouth every 8 (eight) hours as needed for nausea or vomiting. 04/03/16 04/03/17  Eula Listen, MD  pregabalin  (LYRICA) 100 MG capsule Take 1 capsule (100 mg total) by mouth 2 (two) times daily. 03/22/16   Kathrynn Ducking, MD  promethazine-dextromethorphan (PROMETHAZINE-DM) 6.25-15 MG/5ML syrup Take 5 mLs by mouth at bedtime as needed for cough. 06/25/16   Burnard Hawthorne, FNP  rOPINIRole (REQUIP) 1 MG tablet Take 1 tablet (1 mg total) by mouth at bedtime. 11/06/15   [name], MD  sitaGLIPtin (JANUVIA) 100 MG tablet Take 100 mg by mouth daily.    Historical Provider, MD  traMADol (ULTRAM) 50 MG tablet Take 1 tablet (50 mg total) by mouth 4 (four) times daily as needed. 02/14/16   [name], MD  valACYclovir (VALTREX) 500 MG tablet 4 today, repeat once in 12 hours 06/08/16   Deneise Lever, MD    Allergies Diovan [valsartan]; Erythromycin; Flagyl [metronidazole]; Glucophage [metformin hcl];  Xanax [alprazolam]; Amoxicillin; Demeclocycline; Erythromycin base; Metformin; Sulfa antibiotics; and Tetracyclines & related  Family History  Problem Relation Age of Onset  . Heart disease Father   . Hypertension Father   . Prostate cancer Father   . Stroke Father   . Osteoporosis Father   . Stroke Mother   . Depression Mother   . Headache Mother   . Heart disease Mother   . Thyroid disease Mother   . Hypertension Mother   . Diabetes Daughter   . Heart disease Daughter   . Hypertension Daughter   . Hypertension Son     Social History Social History  Substance Use Topics  . Smoking status: Never Smoker  . Smokeless tobacco: Never Used  . Alcohol use No    Review of Systems Constitutional: No fever/chills Eyes: No visual changes. ENT: sore throat for weeks, now resolved.  resolved right ear pain Cardiovascular: Denies chest pain. Respiratory: +shortness of breath and cough x 1 month Gastrointestinal: No abdominal pain.  No nausea, no vomiting.  No diarrhea.  No constipation. Genitourinary: Negative for dysuria. Musculoskeletal: Negative for back pain. Skin: Negative for  rash. Neurological: Negative for headaches, focal weakness or numbness.  10-point ROS otherwise negative.  ____________________________________________   PHYSICAL EXAM:  VITAL SIGNS: ED Triage Vitals [08/03/16 0013]  Enc Vitals Group     BP (!) 116/52     Pulse Rate 89     Resp 18     Temp 98.9 F (37.2 C)     Temp Source Oral     SpO2 94 %     Weight 241 lb (109.3 kg)     Height 5\' 5"  (1.651 m)     Head Circumference      Peak Flow      Pain Score      Pain Loc      Pain Edu?      Excl. in [place]?     Constitutional: Alert and oriented. Well appearing and in no acute distress. Eyes: Conjunctivae are normal. PERRL. EOMI. Head: Atraumatic. Nose: Mild congestion/rhinnorhea. Mouth/Throat: Mucous membranes are moist.  Oropharynx non-erythematous. Neck: No stridor.  No meningeal signs.   Cardiovascular: Normal rate, regular rhythm. Good peripheral circulation. Grossly normal heart sounds. Respiratory: Normal respiratory effort.  No retractions. Lungs CTAB without W/R/R Gastrointestinal: Obese.  Soft and nontender. No distention.  Musculoskeletal: No lower extremity tenderness nor edema. No gross deformities of extremities. Neurologic:  Normal speech and language. No gross focal neurologic deficits are appreciated.  Skin:  Skin is warm, dry and intact. No rash noted. Psychiatric: Mood and affect are normal. Speech and behavior are normal.  ____________________________________________   LABS (all labs ordered are listed, but only abnormal results are displayed)  Labs Reviewed  CBC WITH DIFFERENTIAL/PLATELET - Abnormal; Notable for the following:       Result Value   RDW 15.1 (*)    All other components within normal limits  COMPREHENSIVE METABOLIC PANEL - Abnormal; Notable for the following:    Potassium 3.1 (*)    Glucose, Bld 139 (*)    Creatinine, Ser 1.10 (*)    Calcium 8.3 (*)    Albumin 3.3 (*)    GFR calc non Af Amer 50 (*)    GFR calc Af Amer 58 (*)    All  other components within normal limits  TROPONIN I   ____________________________________________  EKG  ED ECG REPORT I, Daleysa Kristiansen, the attending physician, personally viewed and interpreted this ECG.  Date: 08/03/2016 EKG Time: 1:04 AM Rate: 70 Rhythm: Left bundle branch block QRS Axis: normal Intervals: normal ST/T Wave abnormalities: normal Conduction Disturbances: none Narrative Interpretation: Left bundle branch block is new compared to last EKG  ____________________________________________  RADIOLOGY   Dg Chest 2 View  Result Date: 08/03/2016 CLINICAL DATA:  Productive cough with yellow sputum EXAM: CHEST  2 VIEW COMPARISON:  06/21/2014 FINDINGS: Linear scarring or atelectasis in the left lower lung. No acute consolidation or effusion. Stable borderline enlargement of cardiomediastinal silhouette. No pneumothorax. Degenerative changes of the spine. Surgical clips in the upper abdomen. IMPRESSION: No radiographic evidence for acute cardiopulmonary abnormality Electronically Signed   By: Donavan Foil M.D.   On: 08/03/2016 01:23    ____________________________________________   PROCEDURES  Procedure(s) performed:   Procedures   Critical Care performed: No ____________________________________________   INITIAL IMPRESSION / ASSESSMENT AND PLAN / ED COURSE  Pertinent labs & imaging results that were available during my care of the patient were reviewed by me and considered in my medical decision making (see chart for details).  the patient is well-appearing and in no acute distress with normal vital signs.  I suspect she has a persistent viral cough but I doubt a bacterial etiology or any acute or emergent medical condition. I will evaluate further with radiographs, EKG, and blood work, but I suspect symptomatic  treatment and outpatient follow-up will be appropriate.   Clinical Course as of Aug 04 507  Fri Aug 03, 5033  4656 Uncertain significance of new  left bundle branch block in the absence of chest pain but the presence of shortness of breath.we will continue evaluation.  [CF]  0138 Unremarkable CXR DG Chest 2 View [CF]  0308 Reassuring workup.  Her potassium is a little bit low and I will replete before discharge.  I discussed all the results with her and encouraged outpatient follow-up and to continue taking the medication she has at home (she has both Tessalon and cough syrup at home).  Of note, I discussed the left bundle branch block with her and she states that in 2003 she was told she had a left bundle-branch block, but it then resolved.  I verified on her most recent EKG that it was not present at that time.  I called and spoke with Dr. Humphrey Rolls with [facility] and he said he can see her later today at 1:00 PM.  I have provided that information to her.  All of her labs and vital signs are reassuring and she is appropriate for outpatient follow-up with no evidence of acute or emergent medical condition at this time.  [CF]    Clinical Course User Index [CF] Hinda Kehr, MD    ____________________________________________  FINAL CLINICAL IMPRESSION(S) / ED DIAGNOSES  Final diagnoses:  Cough  Shortness of breath  New onset left bundle branch block (LBBB)  Hypokalemia  Viral syndrome     MEDICATIONS GIVEN DURING THIS VISIT:  Medications  HYDROcodone-acetaminophen (NORCO/VICODIN) 5-325 MG per tablet 1 tablet (1 tablet Oral Given 08/03/16 0204)  benzonatate (TESSALON) capsule 200 mg (200 mg Oral Given 08/03/16 0205)  potassium chloride SA (K-DUR,KLOR-CON) CR tablet 40 mEq (40 mEq Oral Given 08/03/16 0410)     NEW OUTPATIENT MEDICATIONS STARTED DURING THIS VISIT:  Discharge Medication List as of 08/03/2016  3:15 AM      Discharge Medication List as of 08/03/2016  3:15 AM      Discharge Medication List as of 08/03/2016  3:15 AM  Note:  This document was prepared using Dragon voice recognition software and may include  unintentional dictation errors.    Hinda Kehr, MD 08/03/16 [phone]

## 2016-08-03 NOTE — Discharge Instructions (Signed)
You have been seen in the Emergency Department (ED) today for a likely viral illness.  Please drink plenty of clear fluids (water, Gatorade, chicken broth, etc).  You may use Tylenol and/or Motrin according to label instructions.  You can alternate between the two without any side effects.  Your labs were normal except for a slightly low potassium for which we gave you a supplement.  Since your EKG showed left bundle branch block which was not present on your last EKG (although you mentioned it may have been present back in 2003), we set up a clinic appointment for you with a cardiologist, Dr. Humphrey Rolls, at 1 PM later today (Friday).  Please attend that appointment if at all possible, or call the number provided for Dr. Laurelyn Sickle clinic to cancel if you feel you are unable to make it.  This is a good opportunity to follow up regarding potential heart issues, however, so we definitely encourage you to go if at all possible.  Call your doctor or return to the Emergency Department (ED) if you are unable to tolerate fluids due to vomiting, have worsening trouble breathing, become extremely tired or difficult to awaken, or if you develop any other symptoms that concern you.

## 2016-08-03 NOTE — ED Notes (Signed)
Patient transported to X-ray 

## 2016-08-06 DIAGNOSIS — R079 Chest pain, unspecified: Secondary | ICD-10-CM | POA: Diagnosis not present

## 2016-08-08 DIAGNOSIS — R079 Chest pain, unspecified: Secondary | ICD-10-CM | POA: Diagnosis not present

## 2016-08-10 DIAGNOSIS — I10 Essential (primary) hypertension: Secondary | ICD-10-CM | POA: Diagnosis not present

## 2016-08-10 DIAGNOSIS — R109 Unspecified abdominal pain: Secondary | ICD-10-CM | POA: Diagnosis not present

## 2016-08-10 DIAGNOSIS — G4730 Sleep apnea, unspecified: Secondary | ICD-10-CM | POA: Diagnosis not present

## 2016-08-10 DIAGNOSIS — I447 Left bundle-branch block, unspecified: Secondary | ICD-10-CM | POA: Diagnosis not present

## 2016-08-14 ENCOUNTER — Telehealth: Payer: Self-pay | Admitting: *Deleted

## 2016-08-14 DIAGNOSIS — Z760 Encounter for issue of repeat prescription: Secondary | ICD-10-CM

## 2016-08-14 NOTE — Telephone Encounter (Signed)
Pt requested to a medication refill for ropinirole, please send this to [place]  She will also need a medication refill for valium, please send this to [facility]

## 2016-08-15 ENCOUNTER — Ambulatory Visit (INDEPENDENT_AMBULATORY_CARE_PROVIDER_SITE_OTHER): Payer: Medicare Other | Admitting: Podiatry

## 2016-08-15 DIAGNOSIS — B351 Tinea unguium: Secondary | ICD-10-CM

## 2016-08-15 DIAGNOSIS — M79676 Pain in unspecified toe(s): Secondary | ICD-10-CM | POA: Diagnosis not present

## 2016-08-15 DIAGNOSIS — E1149 Type 2 diabetes mellitus with other diabetic neurological complication: Secondary | ICD-10-CM

## 2016-08-15 MED ORDER — DIAZEPAM 5 MG PO TABS: 5.0000 mg | ORAL_TABLET | Freq: Every day | ORAL | 0 refills | Status: DC | PRN

## 2016-08-15 MED ORDER — ROPINIROLE HCL 1 MG PO TABS: 1.0000 mg | ORAL_TABLET | Freq: Every day | ORAL | 5 refills | Status: DC

## 2016-08-15 NOTE — Progress Notes (Signed)
She presents today with a chief complaint of painful mycotic nails.  Objective: Vital signs are stable alert and oriented 3. Pulses are nonpalpable. No signs of infection. Toenails are thick yellow dystrophic mycotic bilateral.  Assessment: Pain elicited onychomycosis.  Plan: Debridement toenails 1 through 5 bilateral.

## 2016-08-15 NOTE — Telephone Encounter (Signed)
Last OV with you was 07/13/16. Next OV is 10/11/16.   Requip last filled # 90 w/ 2rf on 11/06/15. It looks like diazepam was last filled #90 on 12/23/15.

## 2016-08-15 NOTE — Telephone Encounter (Signed)
VALIUM PRINTED FOR FAXING TO [facility], OTHER SENT [facility]

## 2016-08-15 NOTE — Telephone Encounter (Signed)
Rx faxed to [place].

## 2016-08-17 DIAGNOSIS — R159 Full incontinence of feces: Secondary | ICD-10-CM | POA: Diagnosis not present

## 2016-08-21 NOTE — Telephone Encounter (Addendum)
I called pt and informed her of below. She states this is our problem to fix because at her last OV 07/13/16 she had approximately 3 weeks left at that time which would allow ample time for her to receive this medication AND she advised [place] then that she needed the refill sent.   She states we did not get the rx to [place] until 08/15/16. She feels that if she isn't able to fill the script at [facility] she is being penalized because it is free that way. If she fills it locally, it costs her.   She also states that the [facility] will fill a 30 day supply. However, they take 3 weeks to get the medication to the pt. She does not want the script at [facility] cancelled. She only wants a few to last her until she receives the mail order. Later she stated she currently has # 11 diazepam 10 mg.  Our med list reflects 5 mg.  She then stated she would do whatever you suggest and [facility] to get prices.  Please advise.

## 2016-08-21 NOTE — Telephone Encounter (Signed)
Pt called and stated that she would like a 2 week supply with 1 refill of diazepam (VALIUM) 5 MG tablet sent to her local pharmacy because the mail away will take 3 weeks to get. Please call pt when completed. Please advise, thank you!  [id number] [place] [place]  Call pt @ [phone]

## 2016-08-21 NOTE — Telephone Encounter (Signed)
I  no longer  REFILL  90 day supplies of diazepam or any controlled substance  because of the liability I incur  in giving a patient that quantity.  ,  That prescription sent to [facility] needs to be discontinued  I will only call in 30 day supplies locally with 5 refills.   PLEASE CALL [facility] AND DISCONTINUE THAT 90 DAY REFILL . Once it has been discontinued  I will call in a 30 day to her local pharmacy

## 2016-08-23 ENCOUNTER — Telehealth: Payer: Self-pay | Admitting: Internal Medicine

## 2016-08-23 NOTE — Telephone Encounter (Addendum)
A ten day supply of diazepam 5 mg can be sent to her local pharmacy.  NOT 10 MG ,  WE HAVE NOT PRESCRIBED 10 MG IN OVER 3 YEARS. YOU CAN PRINT A TEN DAY RX AND IT WILL BE FAXED IN THE MORNING .  YOU CAN TELL HER THAT IN IN THE FUTURE , ALL REFILL REQUESTS NEED TO BE IN WRITING,  NOT DMENTIONED DURING THE OFFICE VISIT WHEN WE ARE HANDLING OTHER MULTIPLE ISSUES.

## 2016-08-23 NOTE — Telephone Encounter (Signed)
Pt called back to follow up. Please advise? Thank you!

## 2016-08-24 ENCOUNTER — Other Ambulatory Visit: Payer: Self-pay | Admitting: Family

## 2016-08-24 DIAGNOSIS — R159 Full incontinence of feces: Secondary | ICD-10-CM | POA: Diagnosis not present

## 2016-08-24 DIAGNOSIS — I10 Essential (primary) hypertension: Secondary | ICD-10-CM

## 2016-08-24 MED ORDER — DIAZEPAM 5 MG PO TABS: ORAL_TABLET | ORAL | 0 refills | Status: DC

## 2016-08-24 NOTE — Addendum Note (Signed)
Addended by: Cresenciano Lick on: 08/24/2016 02:54 PM   Modules accepted: Orders

## 2016-08-24 NOTE — Telephone Encounter (Signed)
Rx printed/signed/faxed to local [facility]. Left mess for patient to call back to advise of below.

## 2016-08-24 NOTE — Telephone Encounter (Signed)
Pt called and stated that her received a msg but was cut off. Asked for a call back, thank you!  Call pt @ [phone]

## 2016-08-28 DIAGNOSIS — Z7689 Persons encountering health services in other specified circumstances: Secondary | ICD-10-CM

## 2016-08-31 DIAGNOSIS — R159 Full incontinence of feces: Secondary | ICD-10-CM | POA: Diagnosis not present

## 2016-09-03 DIAGNOSIS — I447 Left bundle-branch block, unspecified: Secondary | ICD-10-CM | POA: Diagnosis not present

## 2016-09-03 DIAGNOSIS — R9430 Abnormal result of cardiovascular function study, unspecified: Secondary | ICD-10-CM | POA: Diagnosis not present

## 2016-09-03 DIAGNOSIS — G4739 Other sleep apnea: Secondary | ICD-10-CM | POA: Diagnosis not present

## 2016-09-03 DIAGNOSIS — R072 Precordial pain: Secondary | ICD-10-CM | POA: Diagnosis not present

## 2016-09-03 DIAGNOSIS — I10 Essential (primary) hypertension: Secondary | ICD-10-CM | POA: Diagnosis not present

## 2016-09-04 ENCOUNTER — Telehealth: Payer: Self-pay | Admitting: *Deleted

## 2016-09-04 NOTE — Telephone Encounter (Signed)
done

## 2016-09-04 NOTE — Telephone Encounter (Signed)
07/13/16 OV note faxed to number below.

## 2016-09-04 NOTE — Telephone Encounter (Signed)
Rec fax from [facility] care requesting OV notes completed within the last 6 months stating that "the patient is diabetic and in need of diabetic shoes/inserts."   Can you addend your 07/13/16 OV note and I will fax it to [phone].  Call [phone] w/ any questions.

## 2016-09-06 ENCOUNTER — Telehealth: Payer: Self-pay | Admitting: Neurology

## 2016-09-06 ENCOUNTER — Ambulatory Visit: Payer: Medicare Other | Admitting: Neurology

## 2016-09-06 NOTE — Telephone Encounter (Signed)
This patient no showed for a revisit appointment today. 

## 2016-09-07 ENCOUNTER — Telehealth: Payer: Self-pay | Admitting: *Deleted

## 2016-09-07 DIAGNOSIS — I10 Essential (primary) hypertension: Secondary | ICD-10-CM | POA: Diagnosis not present

## 2016-09-07 DIAGNOSIS — I447 Left bundle-branch block, unspecified: Secondary | ICD-10-CM

## 2016-09-07 DIAGNOSIS — I2510 Atherosclerotic heart disease of native coronary artery without angina pectoris: Secondary | ICD-10-CM | POA: Diagnosis not present

## 2016-09-07 DIAGNOSIS — I420 Dilated cardiomyopathy: Secondary | ICD-10-CM

## 2016-09-07 DIAGNOSIS — R109 Unspecified abdominal pain: Secondary | ICD-10-CM | POA: Diagnosis not present

## 2016-09-07 NOTE — Telephone Encounter (Signed)
No sooner appt needed.  Please request OV notes from [place]

## 2016-09-07 NOTE — Telephone Encounter (Signed)
Patient wanted to [facility] [name] that she was diagnosed with Heart decease by Dr Laurelyn Sickle . Pt requested to have [name] review her chart and medication changes.

## 2016-09-07 NOTE — Telephone Encounter (Signed)
Pt is scheduled with you 10/11/16 @ 11:00. Do I need to do anything before then?

## 2016-09-09 ENCOUNTER — Other Ambulatory Visit: Payer: Self-pay | Admitting: Internal Medicine

## 2016-09-12 ENCOUNTER — Encounter: Payer: Self-pay | Admitting: Neurology

## 2016-09-17 DIAGNOSIS — L249 Irritant contact dermatitis, unspecified cause: Secondary | ICD-10-CM | POA: Diagnosis not present

## 2016-09-17 DIAGNOSIS — Z79899 Other long term (current) drug therapy: Secondary | ICD-10-CM | POA: Diagnosis not present

## 2016-09-17 DIAGNOSIS — Z801 Family history of malignant neoplasm of trachea, bronchus and lung: Secondary | ICD-10-CM | POA: Diagnosis not present

## 2016-09-17 DIAGNOSIS — G8929 Other chronic pain: Secondary | ICD-10-CM | POA: Diagnosis not present

## 2016-09-17 DIAGNOSIS — Z1503 Genetic susceptibility to malignant neoplasm of prostate: Secondary | ICD-10-CM | POA: Diagnosis not present

## 2016-09-17 DIAGNOSIS — Z7902 Long term (current) use of antithrombotics/antiplatelets: Secondary | ICD-10-CM | POA: Diagnosis not present

## 2016-09-17 DIAGNOSIS — F418 Other specified anxiety disorders: Secondary | ICD-10-CM | POA: Diagnosis not present

## 2016-09-17 DIAGNOSIS — Z79891 Long term (current) use of opiate analgesic: Secondary | ICD-10-CM | POA: Diagnosis not present

## 2016-09-17 DIAGNOSIS — I248 Other forms of acute ischemic heart disease: Secondary | ICD-10-CM | POA: Diagnosis not present

## 2016-09-17 DIAGNOSIS — Z8042 Family history of malignant neoplasm of prostate: Secondary | ICD-10-CM | POA: Diagnosis not present

## 2016-09-17 DIAGNOSIS — Z1509 Genetic susceptibility to other malignant neoplasm: Secondary | ICD-10-CM | POA: Diagnosis not present

## 2016-09-17 DIAGNOSIS — Z800 Family history of malignant neoplasm of digestive organs: Secondary | ICD-10-CM | POA: Diagnosis not present

## 2016-09-17 DIAGNOSIS — F321 Major depressive disorder, single episode, moderate: Secondary | ICD-10-CM | POA: Diagnosis not present

## 2016-09-17 DIAGNOSIS — Z1371 Encounter for nonprocreative screening for genetic disease carrier status: Secondary | ICD-10-CM | POA: Diagnosis not present

## 2016-09-18 ENCOUNTER — Other Ambulatory Visit: Payer: Self-pay

## 2016-09-18 MED ORDER — DIAZEPAM 10 MG PO TABS: ORAL_TABLET | ORAL | 5 refills | Status: DC

## 2016-09-18 MED ORDER — BUPROPION HCL ER (SR) 100 MG PO TB12: 100.0000 mg | ORAL_TABLET | Freq: Two times a day (BID) | ORAL | 1 refills | Status: DC

## 2016-09-18 NOTE — Telephone Encounter (Signed)
Patient called today and needs her Wellbutrin and Diazepam Rx's sent to [facility] for refills.  Please advise, thanks

## 2016-09-18 NOTE — Telephone Encounter (Signed)
WELLBUTRIN SENT,  DIAZEPAM NEEDS TO BE FAXED

## 2016-09-19 NOTE — Telephone Encounter (Addendum)
Signed Diazepam Rx faxed to [place].

## 2016-09-19 NOTE — Telephone Encounter (Signed)
I called Dr. Laurelyn Sickle office and faxed a request for records to fax # [phone].

## 2016-09-21 DIAGNOSIS — G4739 Other sleep apnea: Secondary | ICD-10-CM | POA: Diagnosis not present

## 2016-09-21 DIAGNOSIS — R55 Syncope and collapse: Secondary | ICD-10-CM | POA: Diagnosis not present

## 2016-09-21 DIAGNOSIS — R0602 Shortness of breath: Secondary | ICD-10-CM | POA: Diagnosis not present

## 2016-09-21 DIAGNOSIS — I10 Essential (primary) hypertension: Secondary | ICD-10-CM | POA: Diagnosis not present

## 2016-09-25 DIAGNOSIS — R55 Syncope and collapse: Secondary | ICD-10-CM | POA: Diagnosis not present

## 2016-09-25 NOTE — Telephone Encounter (Signed)
We did receive his notes,  They were with the labs that were incomplete. [place] cardiology referral wil be made.

## 2016-09-25 NOTE — Telephone Encounter (Signed)
Spoke with Denise Sharp and informed her that we have not seen any office notes from Dr. Laurelyn Sickle office, but that I would call them and ask again if they will fax them over to [place]. The Denise Sharp wanted to know if a referral could be ordered for her for a cardiologist within the [facility] system.

## 2016-09-25 NOTE — Telephone Encounter (Signed)
Pt has requested to know if [name] received office notes from the visit .  Patient has requested to see a cardiologist with [place]. Pt contact [phone]

## 2016-09-25 NOTE — Addendum Note (Signed)
Addended by: [name] on: 09/25/2016 05:09 PM   Modules accepted: Orders

## 2016-09-26 NOTE — Telephone Encounter (Signed)
LMTCB

## 2016-09-27 NOTE — Telephone Encounter (Signed)
Spoke with Denise Sharp to let her know that we did receive the office notes from Dr. Humphrey Rolls and that [name] put the referral in for a cardiologist within the [facility] system. The Denise Sharp stated that they called her yesterday and scheduled her an appt for 10/04/2016 with Dr. Farrel Conners.

## 2016-10-04 ENCOUNTER — Ambulatory Visit: Payer: Medicare Other | Admitting: Cardiology

## 2016-10-09 ENCOUNTER — Other Ambulatory Visit: Payer: Self-pay | Admitting: Oncology

## 2016-10-09 ENCOUNTER — Other Ambulatory Visit: Payer: Self-pay

## 2016-10-09 DIAGNOSIS — D509 Iron deficiency anemia, unspecified: Secondary | ICD-10-CM

## 2016-10-09 NOTE — Progress Notes (Signed)
Denise Sharp  [phone] [phone]  ID: [name] OB: 02-Dec-1945  MR#: [id number]  YPP#:[id number]  Patient Care Team: [name], MD as PCP - General (Internal Medicine) [facility] as Referring Physician  CHIEF COMPLAINT: Iron deficiency anemia.  INTERVAL HISTORY: Patient returns to clinic today for repeat laboratory work and further evaluation. She has increased weakness and fatigue, but attributes this to her husband being in the hospital and increased anxiety and stress. She otherwise feels well. She has no neurologic complaints. She denies any recent fevers or illnesses. She has a good appetite and denies weight loss. She denies any chest pain or shortness of breath. She denies any nausea, vomiting, constipation, or diarrhea. She has no melena or hematochezia. She has no urinary complaints. Patient offers no further specific complaints today.  REVIEW OF SYSTEMS:   Review of Systems  Constitutional: Positive for malaise/fatigue. Negative for fever and weight loss.  Respiratory: Negative.  Negative for cough and shortness of breath.   Cardiovascular: Negative.  Negative for chest pain and leg swelling.  Gastrointestinal: Negative.  Negative for abdominal pain, blood in stool and melena.  Genitourinary: Negative.   Musculoskeletal: Negative.   Neurological: Positive for weakness.  Psychiatric/Behavioral: Negative.  The patient is not nervous/anxious.     As per HPI. Otherwise, a complete review of systems is negative.  PAST MEDICAL HISTORY: Past Medical History:  Diagnosis Date  . Abnormality of gait 03/25/2013  . Adrenal mass ([place])   . Adrenal mass, left ([place])   . Anemia    iron deficient post  2 unit txfsn 2009, normal endo/colonoscopy by [facility]  . Apnea   . Arthritis   . Cervical spinal stenosis 1994   due to trauma to back ([employer] accident), has intermittent paralysis and parasthesias  . Cervicogenic headache  03/23/2014  . Depression   . Diabetes mellitus   . Dizziness    chronic dizziness  . Esophageal stenosis March 2011   with transietn outlet obstruction by food, cleared by EGD   . Headache(784.0)   . Hypertension   . IBS (irritable bowel syndrome)   . LBBB (left bundle branch block)   . Obesity   . Obstructive sleep apnea    using CPAP  . 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 Restless leg syndrome   . Syncope and collapse 03/12/2014    PAST SURGICAL HISTORY: Past Surgical History:  Procedure Laterality Date  . ABDOMINAL HYSTERECTOMY    . APPENDECTOMY    . DIAPHRAGMATIC HERNIA REPAIR  2015  . [place]  . GALLBLADDER SURGERY  resection  . GASTRIC BYPASS    . GASTRIC BYPASS  2000, 2005   Dr. Debroah Loop  . [facility] IMPLANT PLACEMENT  April 2013   [place]  . JOINT REPLACEMENT  2007   bilateral knee. [name],  [name]  . ROTATOR CUFF REPAIR     right  . [place]  . TOTAL ABDOMINAL HYSTERECTOMY W/ BILATERAL SALPINGOOPHORECTOMY  1974  . UMBILICAL HERNIA REPAIR  Aug 11, 2015    FAMILY HISTORY: Family History  Problem Relation Age of Onset  . Heart disease Father   . Hypertension Father   . Prostate cancer Father   . Stroke Father   . Osteoporosis Father   . Stroke Mother   . Depression Mother   . Headache Mother   . Heart disease Mother   . Thyroid disease Mother   . Hypertension Mother   .  Diabetes Daughter   . Heart disease Daughter   . Hypertension Daughter   . Hypertension Son     ADVANCED DIRECTIVES (Y/N):  N  HEALTH MAINTENANCE: Social History  Substance Use Topics  . Smoking status: Never Smoker  . Smokeless tobacco: Never Used  . Alcohol use No     Colonoscopy:  PAP:  Bone density:  Lipid panel:  Allergies  Allergen Reactions  . Diovan [Valsartan]   . Erythromycin   . Flagyl [Metronidazole]   . Glucophage [Metformin Hcl]   . Xanax [Alprazolam]   . Amoxicillin Diarrhea  . Demeclocycline Hives  .  Erythromycin Base   . Metformin   . Sulfa Antibiotics Rash    As child  . Tetracyclines & Related Hives and Rash    Current Outpatient Prescriptions  Medication Sig Dispense Refill  . albuterol (PROVENTIL HFA;VENTOLIN HFA) 108 (90 Base) MCG/ACT inhaler Inhale 2 puffs into the lungs every 8 (eight) hours as needed for wheezing or shortness of breath. 1 Inhaler 0  . amLODipine (NORVASC) 5 MG tablet Take 1 tablet (5 mg total) by mouth daily. 90 tablet 0  . cholestyramine light (PREVALITE) 4 GM/DOSE powder Take 4 g by mouth 2 (two) times daily.    . diazepam (VALIUM) 10 MG tablet 1 TABLET DAILY AS NEEDED FOR ANXIETY OR INSOMNIA (Patient taking differently: Take 10 mg by mouth at bedtime. 1 TABLET DAILY AS NEEDED FOR ANXIETY OR INSOMNIA) 30 tablet 5  . hydrALAZINE (APRESOLINE) 50 MG tablet Take 50 mg by mouth 2 (two) times daily.    . hydrocodone-acetaminophen (ZYDONE) 5-400 MG tablet Take 1 tablet by mouth every 6 (six) hours as needed for pain.    . hyoscyamine (LEVSIN SL) 0.125 MG SL tablet Place 0.125 mg under the tongue 2 (two) times daily.    [name] levETIRAcetam (KEPPRA) 500 MG tablet Take 1 tablet (500 mg total) by mouth 2 (two) times daily. 60 tablet 3  . LUNESTA 2 MG TABS tablet Take 1 tablet (2 mg total) by mouth at bedtime as needed for sleep. Take immediately before bedtime 90 tablet 1  . ondansetron (ZOFRAN) 4 MG tablet Take 1 tablet (4 mg total) by mouth every 8 (eight) hours as needed for nausea or vomiting. 15 tablet 0  . rOPINIRole (REQUIP) 1 MG tablet Take 1 tablet (1 mg total) by mouth at bedtime. 90 tablet 5  . sitaGLIPtin (JANUVIA) 100 MG tablet Take 100 mg by mouth daily.    . traMADol (ULTRAM) 50 MG tablet Take 1 tablet (50 mg total) by mouth 4 (four) times daily as needed. 30 tablet 4  . buPROPion (WELLBUTRIN SR) 100 MG 12 hr tablet Take 1 tablet (100 mg total) by mouth 2 (two) times daily. (Patient not taking: Reported on 10/10/2016) 180 tablet 1  . pregabalin (LYRICA) 100 MG  capsule Take 1 capsule (100 mg total) by mouth 2 (two) times daily. (Patient not taking: Reported on 10/10/2016)     No current facility-administered medications for this visit.     OBJECTIVE: Vitals:   10/10/16 1354  BP: 127/79  Pulse: 96  Resp: 18  Temp: 98.7 F (37.1 C)     Body mass index is 40.75 kg/m.    ECOG FS:0 - Asymptomatic  General: Well-developed, well-nourished, no acute distress. Eyes: Pink conjunctiva, anicteric sclera. Lungs: Clear to auscultation bilaterally. Heart: Regular rate and rhythm. No rubs, murmurs, or gallops. Abdomen: Soft, nontender, nondistended. No organomegaly noted, normoactive bowel sounds. Musculoskeletal: No edema, cyanosis,  or clubbing. Neuro: Alert, answering all questions appropriately. Cranial nerves grossly intact. Skin: No rashes or petechiae noted. Psych: Normal affect.  LAB RESULTS:  Lab Results  Component Value Date   NA 140 08/03/2016   K 3.1 (L) 08/03/2016   CL 105 08/03/2016   CO2 26 08/03/2016   GLUCOSE 139 (H) 08/03/2016   BUN 18 08/03/2016   CREATININE 1.10 (H) 08/03/2016   CALCIUM 8.3 (L) 08/03/2016   PROT 6.9 08/03/2016   ALBUMIN 3.3 (L) 08/03/2016   AST 37 08/03/2016   ALT 25 08/03/2016   ALKPHOS 100 08/03/2016   BILITOT 0.5 08/03/2016   GFRNONAA 50 (L) 08/03/2016   GFRAA 58 (L) 08/03/2016    Lab Results  Component Value Date   WBC 8.8 10/10/2016   NEUTROABS 6.3 10/10/2016   HGB 13.4 10/10/2016   HCT 40.0 10/10/2016   MCV 88.1 10/10/2016   PLT 312 10/10/2016   Lab Results  Component Value Date   IRON 77 06/29/2016   TIBC 325 06/12/2016   IRONPCTSAT 22.4 06/29/2016   Lab Results  Component Value Date   FERRITIN 96.9 06/29/2016     STUDIES: No results found.  ASSESSMENT: Iron deficiency anemia.  PLAN:   1. Iron deficiency anemia: Likely secondary to poor absorption secondary to history of gastric bypass. Patient's hemoglobin and iron stores continue to be within normal limits. She does not  require additional IV iron. Patient last received Feraheme on January 19, 2016. No further intervention is needed. Return to clinic in 4 months with repeat laboratory work and further evaluation.If patient's hemoglobin and iron stores remain within normal limits at that time, she possibly could be discharged from clinic.  Approximately 20 minutes was spent in discussion of which greater than 50% was consultation.   Patient expressed understanding and was in agreement with this plan. She also understands that She can call clinic at any time with any questions, concerns, or complaints.    Lloyd Huger, MD   10/10/2016 2:20 PM

## 2016-10-10 ENCOUNTER — Inpatient Hospital Stay: Payer: Medicare Other | Attending: Oncology

## 2016-10-10 ENCOUNTER — Inpatient Hospital Stay (HOSPITAL_BASED_OUTPATIENT_CLINIC_OR_DEPARTMENT_OTHER): Payer: Medicare Other | Admitting: Oncology

## 2016-10-10 ENCOUNTER — Inpatient Hospital Stay: Payer: Medicare Other

## 2016-10-10 ENCOUNTER — Encounter: Payer: Self-pay | Admitting: Oncology

## 2016-10-10 VITALS — BP 127/79 | HR 96 | Temp 98.7°F | Resp 18 | Ht 65.0 in | Wt 244.9 lb

## 2016-10-10 DIAGNOSIS — R42 Dizziness and giddiness: Secondary | ICD-10-CM | POA: Insufficient documentation

## 2016-10-10 DIAGNOSIS — D509 Iron deficiency anemia, unspecified: Secondary | ICD-10-CM | POA: Insufficient documentation

## 2016-10-10 DIAGNOSIS — K222 Esophageal obstruction: Secondary | ICD-10-CM

## 2016-10-10 DIAGNOSIS — G629 Polyneuropathy, unspecified: Secondary | ICD-10-CM | POA: Insufficient documentation

## 2016-10-10 DIAGNOSIS — E119 Type 2 diabetes mellitus without complications: Secondary | ICD-10-CM | POA: Insufficient documentation

## 2016-10-10 DIAGNOSIS — G4730 Sleep apnea, unspecified: Secondary | ICD-10-CM | POA: Insufficient documentation

## 2016-10-10 DIAGNOSIS — R269 Unspecified abnormalities of gait and mobility: Secondary | ICD-10-CM

## 2016-10-10 DIAGNOSIS — I447 Left bundle-branch block, unspecified: Secondary | ICD-10-CM

## 2016-10-10 DIAGNOSIS — M4802 Spinal stenosis, cervical region: Secondary | ICD-10-CM

## 2016-10-10 DIAGNOSIS — E279 Disorder of adrenal gland, unspecified: Secondary | ICD-10-CM | POA: Diagnosis not present

## 2016-10-10 DIAGNOSIS — R5383 Other fatigue: Secondary | ICD-10-CM | POA: Insufficient documentation

## 2016-10-10 DIAGNOSIS — F419 Anxiety disorder, unspecified: Secondary | ICD-10-CM | POA: Diagnosis not present

## 2016-10-10 DIAGNOSIS — Z8042 Family history of malignant neoplasm of prostate: Secondary | ICD-10-CM | POA: Insufficient documentation

## 2016-10-10 DIAGNOSIS — F329 Major depressive disorder, single episode, unspecified: Secondary | ICD-10-CM | POA: Insufficient documentation

## 2016-10-10 DIAGNOSIS — Z79899 Other long term (current) drug therapy: Secondary | ICD-10-CM | POA: Diagnosis not present

## 2016-10-10 DIAGNOSIS — D649 Anemia, unspecified: Secondary | ICD-10-CM | POA: Diagnosis not present

## 2016-10-10 DIAGNOSIS — I10 Essential (primary) hypertension: Secondary | ICD-10-CM

## 2016-10-10 DIAGNOSIS — E669 Obesity, unspecified: Secondary | ICD-10-CM | POA: Diagnosis not present

## 2016-10-10 DIAGNOSIS — R531 Weakness: Secondary | ICD-10-CM | POA: Diagnosis not present

## 2016-10-10 DIAGNOSIS — G2581 Restless legs syndrome: Secondary | ICD-10-CM | POA: Diagnosis not present

## 2016-10-10 LAB — CBC WITH DIFFERENTIAL/PLATELET
Basophils Absolute: 0.1 K/uL (ref 0–0.1)
Basophils Relative: 1 %
Eosinophils Absolute: 0.1 K/uL (ref 0–0.7)
Eosinophils Relative: 1 %
HCT: 40 % (ref 35.0–47.0)
Hemoglobin: 13.4 g/dL (ref 12.0–16.0)
Lymphocytes Relative: 22 %
Lymphs Abs: 1.9 K/uL (ref 1.0–3.6)
MCH: 29.4 pg (ref 26.0–34.0)
MCHC: 33.4 g/dL (ref 32.0–36.0)
MCV: 88.1 fL (ref 80.0–100.0)
Monocytes Absolute: 0.4 K/uL (ref 0.2–0.9)
Monocytes Relative: 5 %
Neutro Abs: 6.3 K/uL (ref 1.4–6.5)
Neutrophils Relative %: 71 %
Platelets: 312 K/uL (ref 150–440)
RBC: 4.54 MIL/uL (ref 3.80–5.20)
RDW: 14.9 % — ABNORMAL HIGH (ref 11.5–14.5)
WBC: 8.8 K/uL (ref 3.6–11.0)

## 2016-10-10 LAB — IRON AND TIBC
Iron: 42 ug/dL (ref 28–170)
Saturation Ratios: 14 % (ref 10.4–31.8)
TIBC: 298 ug/dL (ref 250–450)
UIBC: 257 ug/dL

## 2016-10-10 LAB — FERRITIN: Ferritin: 85 ng/mL (ref 11–307)

## 2016-10-10 NOTE — Progress Notes (Signed)
Pt stressed about her husband being in hospital, not getting rest worrying about him. Been not herself. Tired but probably from the stress

## 2016-10-11 ENCOUNTER — Ambulatory Visit: Payer: Medicare Other | Admitting: Internal Medicine

## 2016-10-12 ENCOUNTER — Ambulatory Visit: Payer: Medicare Other | Admitting: Internal Medicine

## 2016-10-15 LAB — HM DIABETES EYE EXAM

## 2016-10-16 ENCOUNTER — Ambulatory Visit (INDEPENDENT_AMBULATORY_CARE_PROVIDER_SITE_OTHER): Payer: Medicare Other | Admitting: Internal Medicine

## 2016-10-16 ENCOUNTER — Encounter: Payer: Self-pay | Admitting: Internal Medicine

## 2016-10-16 VITALS — BP 140/82 | HR 86 | Ht 63.0 in | Wt 242.8 lb

## 2016-10-16 DIAGNOSIS — R0602 Shortness of breath: Secondary | ICD-10-CM | POA: Diagnosis not present

## 2016-10-16 DIAGNOSIS — I10 Essential (primary) hypertension: Secondary | ICD-10-CM

## 2016-10-16 DIAGNOSIS — R0789 Other chest pain: Secondary | ICD-10-CM | POA: Diagnosis not present

## 2016-10-16 DIAGNOSIS — I447 Left bundle-branch block, unspecified: Secondary | ICD-10-CM | POA: Diagnosis not present

## 2016-10-16 DIAGNOSIS — R42 Dizziness and giddiness: Secondary | ICD-10-CM | POA: Diagnosis not present

## 2016-10-16 MED ORDER — CARVEDILOL 3.125 MG PO TABS: 3.1250 mg | ORAL_TABLET | Freq: Two times a day (BID) | ORAL | 3 refills | Status: DC

## 2016-10-16 MED ORDER — CARVEDILOL 3.125 MG PO TABS: 3.1250 mg | ORAL_TABLET | Freq: Two times a day (BID) | ORAL | 0 refills | Status: DC

## 2016-10-16 MED ORDER — HYDRALAZINE HCL 25 MG PO TABS: 25.0000 mg | ORAL_TABLET | Freq: Two times a day (BID) | ORAL | 3 refills | Status: DC

## 2016-10-16 NOTE — Progress Notes (Signed)
New Outpatient Visit Date: 10/16/2016  Referring Provider: [name], MD [address], [place] [address]  Chief Complaint: Abnormal EKG  HPI:  Denise Sharp is seen today for the evaluation of abnormal EKG at the request of [name]. The patient is a 71 y.o. 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 Patient developed a flulike illness in January, 2018, for which she sought attention in the ED. While there, EKG revealed left bundle branch block in the setting of hypokalemia with aspirin 3.1. The patient was referred to Dr. Humphrey Rolls for urgent evaluation. Workup included transthoracic echocardiogram and myocardial perfusion stress test (see details below). The patient reports that Dr. Chancy Milroy also wish to do a coronary CT but was unable to obtain adequate IV access office. Therefore, calcium score only was performed. He therefore recommended that the patient undergo cardiac catheterization. The patient had a left heart catheterization in 2014 by Dr. Clayborn Bigness. Unfortunately, she had a difficult experience due to poor IV access and issues with sedation. However, no significant CAD was identified at the time. Myocardial perfusion stress test at [facility] in anticipation of urgent hernia repair last year was also negative.  The patient reports a long history of exertional dyspnea and intermittent chest tightness that can occur both at rest and with exertion. The pain is mild and without radiation. This has not changed  significantly for at least 6-12 months. She also has chronic leg edema that fluctuates in severity. She denies orthopnea and PND, using a CPAP at night and when she naps. The patient's mobility is restricted; she typically gets around in a power wheelchair when leaving the house.  After being referred to Dr. Humphrey Rolls, the patient was restarted on multiple medications including aspirin, hydralazine, and rosuvastatin. She is concerned about worsening dizziness and balance problems his medications. She notes very poor feeling in her legs and has difficulty walking backwards.  --------------------------------------------------------------------------------------------------  Cardiovascular History & Procedures: Cardiovascular Problems:  Intermittent left bundle branch block  Chronic atypical chest pain and shortness of breath  Risk Factors:  Hypertension, hyperlipidemia, diabetes mellitus, obesity, sedentary lifestyle, and age greater than 64  Cath/PCI:  LHC (07/24/12, [facility]): LMCA normal. LAD, LCx, and RCA with minor luminal irregularities.  LHC (04/15/86, [facility]): Normal LMCA, LAD, LCx, and RCA. LVEF 65%.  CV Surgery:  None  EP Procedures and Devices:  None  Non-Invasive Evaluation(s):  Cardiac CT calcium score (09/03/16, Dr. Humphrey Rolls): Coronary calcium score 1548.  Pharmacologic myocardial perfusion stress test (08/08/16, Dr. Humphrey Rolls): "Equivocal stress test" with small inferior wall reversible defect and hyperdynamic LV contraction (EF 87%).  Transthoracic echocardiogram (08/06/16, Dr. Darrow Bussing): Technically difficult study with mildly dilated left ventricle. LVEF was normal with normal wall motion. Grade 1 diastolic dysfunction. Mildly dilated RV with normal contraction. Mitral annular calcification present with mild MR. Mild PR and TR also present. Normal pulmonary artery pressure. Severe left and mild right atrial enlargement.  Pharmacologic myocardial perfusion stress test (08/11/15, [facility]):  Normal perfusion without ischemia or scar. LVEF 81%.  Recent CV Pertinent Labs: Lab Results  Component Value Date   CHOL 196 06/29/2016   CHOL 155 02/27/2014  HDL 55.80 06/29/2016   HDL 43 02/27/2014   LDLCALC 105 (H) 06/29/2016   LDLCALC 88 02/27/2014   LDLDIRECT 111.0 06/16/2015   TRIG 179.0 (H) 06/29/2016   TRIG 121 02/27/2014   CHOLHDL 4 06/29/2016   K 3.1 (L) 08/03/2016   K 4.0 06/21/2014   MG 1.5 (L) 08/05/2012   BUN 18 08/03/2016   BUN 11 10/20/2014   BUN 12 06/21/2014   CREATININE 1.10 (H) 08/03/2016   CREATININE 0.84 08/13/2014    --------------------------------------------------------------------------------------------------  Past Medical History:  Diagnosis Date  . Abnormality of gait 03/25/2013  . Adrenal mass ([place])   . Adrenal mass, left ([place])   . Anemia    iron deficient post  2 unit txfsn 2009, normal endo/colonoscopy by [facility]  . Apnea   . Arthritis   . Cervical spinal stenosis 1994   due to trauma to back ([employer] accident), has intermittent paralysis and parasthesias  . Cervicogenic headache 03/23/2014  . Depression   . Diabetes mellitus   . Dizziness    chronic dizziness  . Esophageal stenosis March 2011   with transietn outlet obstruction by food, cleared by EGD   . Headache(784.0)   . Hypertension   . IBS (irritable bowel syndrome)   . LBBB (left bundle branch block)   . Obesity   . Obstructive sleep apnea    using CPAP  . 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 Restless leg syndrome   . Syncope and collapse 03/12/2014    Past Surgical History:  Procedure Laterality Date  . ABDOMINAL HYSTERECTOMY    . APPENDECTOMY    . DIAPHRAGMATIC HERNIA REPAIR  2015  . [place]  . GALLBLADDER SURGERY  resection  . GASTRIC BYPASS    . GASTRIC BYPASS  2000, 2005   Dr. Debroah Loop  . [facility] IMPLANT PLACEMENT  April 2013   [place]  . JOINT REPLACEMENT  2007   bilateral knee. [name],  [name]  . ROTATOR CUFF REPAIR     right    . [place]  . TOTAL ABDOMINAL HYSTERECTOMY W/ BILATERAL SALPINGOOPHORECTOMY  1974  . UMBILICAL HERNIA REPAIR  Aug 11, 2015    Outpatient Encounter Prescriptions as of 10/16/2016  Medication Sig  . albuterol (PROVENTIL HFA;VENTOLIN HFA) 108 (90 Base) MCG/ACT inhaler Inhale 2 puffs into the lungs every 8 (eight) hours as needed for wheezing or shortness of breath.  [name] amLODipine (NORVASC) 5 MG tablet Take 1 tablet (5 mg total) by mouth daily.  [name] aspirin EC 81 MG tablet Take 81 mg by mouth daily.  . cholestyramine light (PREVALITE) 4 GM/DOSE powder Take 4 g by mouth 2 (two) times daily.  . diazepam (VALIUM) 10 MG tablet 1 TABLET DAILY AS NEEDED FOR ANXIETY OR INSOMNIA (Patient taking differently: Take 10 mg by mouth at bedtime. 1 TABLET DAILY AS NEEDED FOR ANXIETY OR INSOMNIA)  . hydrALAZINE (APRESOLINE) 50 MG tablet Take 50 mg by mouth 2 (two) times daily.  . hydrocodone-acetaminophen (ZYDONE) 5-400 MG tablet Take 1 tablet by mouth every 6 (six) hours as needed for pain.  . hyoscyamine (LEVSIN SL) 0.125 MG SL tablet Place 0.125 mg under the tongue 2 (two) times daily.  [name] levETIRAcetam (KEPPRA) 500 MG tablet Take 1 tablet (500 mg total) by mouth 2 (two) times daily.  Johnnye Sima 2 MG TABS tablet Take 1 tablet (2 mg total) by mouth at bedtime as needed for sleep. Take immediately before bedtime  . ondansetron (ZOFRAN)  4 MG tablet Take 1 tablet (4 mg total) by mouth every 8 (eight) hours as needed for nausea or vomiting.  . pregabalin (LYRICA) 100 MG capsule Take 1 capsule (100 mg total) by mouth 2 (two) times daily.  [name] rOPINIRole (REQUIP) 1 MG tablet Take 1 tablet (1 mg total) by mouth at bedtime.  . rosuvastatin (CRESTOR) 10 MG tablet Take 10 mg by mouth daily.  . sitaGLIPtin (JANUVIA) 100 MG tablet Take 100 mg by mouth daily.  . traMADol (ULTRAM) 50 MG tablet Take 1 tablet (50 mg total) by mouth 4 (four) times daily as needed.  [name] buPROPion (WELLBUTRIN SR) 100 MG 12 hr tablet  Take 1 tablet (100 mg total) by mouth 2 (two) times daily. (Patient not taking: Reported on 10/10/2016)   No facility-administered encounter medications on file as of 10/16/2016.     Allergies: Diovan [valsartan]; Erythromycin; Flagyl [metronidazole]; Glucophage [metformin hcl]; Xanax [alprazolam]; Amoxicillin; Demeclocycline; Erythromycin base; Metformin; Sulfa antibiotics; and Tetracyclines & related  Social History   Social History  . Marital status: Married    Spouse name: [name]   . Number of children: 2  . Years of education: College    Occupational History  .  Retired   Social History Main Topics  . Smoking status: Never Smoker  . Smokeless tobacco: Never Used  . Alcohol use No  . Drug use: No  . Sexual activity: Not Currently   Other Topics Concern  . Not on file   Social History Narrative   Patient lives at home with husband [name].    Patient has 2 children.    Patient has [employer].    Patient is on disability.    Patient is right handed.     Family History  Problem Relation Age of Onset  . Heart disease Father   . Hypertension Father   . Prostate cancer Father   . Stroke Father   . Osteoporosis Father   . Stroke Mother   . Depression Mother   . Headache Mother   . Heart disease Mother   . Thyroid disease Mother   . Hypertension Mother   . Diabetes Daughter   . Heart disease Daughter   . Hypertension Daughter   . Hypertension Son     Review of Systems: Patient notes increased stress in her life recently due to her husband's cancer diagnosis. Otherwise, a 12-system review of systems was performed and was negative except as noted in the HPI.  --------------------------------------------------------------------------------------------------  Physical Exam: BP 140/82   Pulse 86   Ht 5\' 3"  (1.6 m)   Wt 242 lb 12 oz (110.1 kg)   BMI 43.00 kg/m   Position Blood pressure (mmHg) Heart rate (bpm)  Lying 128/76 83  Sitting 128/78 87  Standing  128/76 92  Standing (3 minutes) 132/84 95   General:  Morbidly obese woman, seated on the exam table. She is accompanied by her husband. HEENT: No conjunctival pallor or scleral icterus.  Moist mucous membranes.  OP clear. Neck: Supple without lymphadenopathy, thyromegaly, JVD, or HJR.  No carotid bruit. Lungs: Normal work of breathing.  Mildly diminished breath sounds throughout. Heart: Regular rate and rhythm without murmurs, rubs, or gallops. Unable to assess PMI due to body habitus. Abd: Bowel sounds present.  Soft with mild right upper quadrant tenderness. Nondistended. Unable to assess hepatosplenomegaly due to body habitus. Ext: Trace bilateral pretibial edema.  Radial, PT, and DP pulses are 2+ bilaterally Skin: warm and dry without rash Neuro:  CNIII-XII intact.  Diminished fine touch sensation in both feet. Psych: Normal mood and affect.  EKG:  Normal sinus rhythm with nonspecific ST/T changes. PVC noted on prior outside tracing from 08/10/16 is no longer present. EKGs as far back as 12/13/06 have intermittently demonstrated a left bundle branch block (I have personally reviewed the current and multiple prior tracings).   Lab Results  Component Value Date   WBC 8.8 10/10/2016   HGB 13.4 10/10/2016   HCT 40.0 10/10/2016   MCV 88.1 10/10/2016   PLT 312 10/10/2016    Lab Results  Component Value Date   NA 140 08/03/2016   K 3.1 (L) 08/03/2016   CL 105 08/03/2016   CO2 26 08/03/2016   BUN 18 08/03/2016   CREATININE 1.10 (H) 08/03/2016   GLUCOSE 139 (H) 08/03/2016   ALT 25 08/03/2016    Lab Results  Component Value Date   CHOL 196 06/29/2016   HDL 55.80 06/29/2016   LDLCALC 105 (H) 06/29/2016   LDLDIRECT 111.0 06/16/2015   TRIG 179.0 (H) 06/29/2016   CHOLHDL 4 06/29/2016    --------------------------------------------------------------------------------------------------  ASSESSMENT AND PLAN: Intermittent left bundle branch block and atypical chest pain with  shortness of breath EKG today demonstrates sinus rhythm. The patient is known to have an intermittent left bundle branch block as far back as 2008, based on EKGs in our system. I am unsure as to what precipitates her development of the left bundle branch block, though given her numerous cardiac evaluations over the last 5 years, I do not believe that it is ischemic in nature. She has chronic shortness of breath and atypical chest pain that has been stable for at least 6-12 months and are unlikely related to the intermittent bundle branch block. Recent testing by Dr. Humphrey Rolls was notable for coronary artery calcium and "equivocal" myocardial perfusion stress test, though I am unable to view the actual images. However, his report describes a small reversible inferior defect that sounds low risk by his description. Stress test at [facility] just over a year ago was normal. Given her stable symptoms, I think it is reasonable to continue to manage the patient medically. In an attempt to optimize her medication regimen, we have agreed to start carvedilol 3.125 mg twice a day and decrease hydralazine to 25 mg twice a day. Ultimately, I would like to get her off hydralazine. She should continue with primary prevention.  Essential hypertension Blood pressure is borderline elevated today. There is no evidence of orthostatic hypotension. As above, we will decrease hydralazine and start carvedilol 3.125 mg twice a day with plan to further escalate carvedilol as tolerated and wean off hydralazine.  Dizziness No evidence of orthostatic hypotension on exam. I suspect her dizziness is multifactorial, including poor sensation in her legs to decrease proprioception. I will defer further evaluation and management to [name].  Follow-up: Return to clinic in 1 month.  Greater than 60 minutes was spent on this encounter, including interviewing and examining the patient and reviewing multiple outside records. More than 50% of the  encounter was spent face-to-face counseling the patient.  [name], MD 10/16/2016 9:07 PM

## 2016-10-16 NOTE — Patient Instructions (Signed)
Medication Instructions:  Your physician has recommended you make the following change in your medication:  1- START Carvedilol 3.125 mg (1 tablet) by mouth two times a day. 2- DECREASE Hydralazine to 25 mg by mouth two times a day.   Labwork: none  Testing/Procedures: non3  Follow-Up: Your physician recommends that you schedule a follow-up appointment in: [place].  If you need a refill on your cardiac medications before your next appointment, please call your pharmacy.

## 2016-10-22 DIAGNOSIS — Z6841 Body Mass Index (BMI) 40.0 and over, adult: Secondary | ICD-10-CM | POA: Diagnosis not present

## 2016-10-22 DIAGNOSIS — K591 Functional diarrhea: Secondary | ICD-10-CM | POA: Diagnosis not present

## 2016-10-22 DIAGNOSIS — R159 Full incontinence of feces: Secondary | ICD-10-CM | POA: Diagnosis not present

## 2016-10-25 ENCOUNTER — Telehealth: Payer: Self-pay | Admitting: Internal Medicine

## 2016-10-25 NOTE — Telephone Encounter (Signed)
It is fine to refill this prescription. Please let the patient know that in the future, she can look at the label on her current bottle of medication; it should list the prescribing physician. It is best for her to request refills from the prescribing physician so as to avoid duplication of medications or other inadvertent errors. Thanks.

## 2016-10-25 NOTE — Telephone Encounter (Signed)
Pt requesting Rx for Crestor 10 mg tablet. Please advise if ok to refill.

## 2016-10-25 NOTE — Telephone Encounter (Signed)
Patient of Dr End. Dr End last saw patient on 10/16/16 where it was noted on med list patient was on the Rosuvastatin 10mg  daily. Unclear who original prescription was prescribed by.  Patient is patient Dr Humphrey Rolls as well. Will forward to Dr End to confirm he will manage this medication.

## 2016-10-25 NOTE — Telephone Encounter (Signed)
°*  STAT* If patient is at the pharmacy, call can be transferred to refill team.   1. Which medications need to be refilled? (please list name of each medication and dose if known)  Rosuvastatin   2. Which pharmacy/location (including street and city if local pharmacy) is medication to be sent to? [facility]  In [place]   3. Do they need a 30 day or 90 day supply?  30 day

## 2016-10-26 NOTE — Telephone Encounter (Signed)
Spoke with patient and she states that Dr. Chancy Milroy (Cardiology) had originally prescribed this when the patient was in the hospital but that when she followed up with Dr. Saunders Revel she was told to continue her current medications. She states that her mail order pharmacy did receive a prescription and she does not need any at this time. She verbalized understanding to give [place] a call back if she should need [place] to assist with this. She was appreciative for the call and had no further questions at this time.

## 2016-10-26 NOTE — Telephone Encounter (Signed)
See below . Thanks

## 2016-10-31 ENCOUNTER — Encounter: Payer: Self-pay | Admitting: Neurology

## 2016-11-02 ENCOUNTER — Ambulatory Visit: Payer: Medicare Other

## 2016-11-05 ENCOUNTER — Encounter: Payer: Self-pay | Admitting: Podiatry

## 2016-11-05 ENCOUNTER — Ambulatory Visit (INDEPENDENT_AMBULATORY_CARE_PROVIDER_SITE_OTHER): Payer: Medicare Other | Admitting: Podiatry

## 2016-11-05 DIAGNOSIS — B351 Tinea unguium: Secondary | ICD-10-CM

## 2016-11-05 DIAGNOSIS — E1149 Type 2 diabetes mellitus with other diabetic neurological complication: Secondary | ICD-10-CM

## 2016-11-05 DIAGNOSIS — M79676 Pain in unspecified toe(s): Secondary | ICD-10-CM | POA: Diagnosis not present

## 2016-11-05 NOTE — Progress Notes (Signed)
She presents today complaining of a painful elongated toenails as well as swelling and a rash to her right ankle area  Objective: Vital signs are stable she is alert and oriented 3 venous insufficiency to the right lower extremity resulting in venous stasis dermatitis medial ankle. She also has long thick yellow dystrophic onychomycotic nails.  Assessment: Venous stasis dermatitis right. Painless onychomycosis.  Plan: Recommended compression hose hydrocortisone cream as well as nail debridement 1 through 5 bilateral cover secondary to pain.

## 2016-11-06 ENCOUNTER — Telehealth: Payer: Self-pay | Admitting: Sports Medicine

## 2016-11-06 NOTE — Telephone Encounter (Signed)
Patient called stated that her leg is itchy

## 2016-11-06 NOTE — Telephone Encounter (Signed)
PATIENT STATES THAT HER LEG IS ITCHY; FEEL LIKE A SUNBURN WITH RASH. TRIED OTC CORTISONE CREAM AND COMPRESSION STOCKINGS; I RECOMMEND PATIENT TO TAKE A BREAK FROM STOCKINGS AT END OF DAY AND TO REFRAIN FROM SLEEPING IN STOCKINGS. CONTINUE WITH CORTISONE CREAM AND TO TAKE BENADRYL ORALLY AS WELL TO HELP WITH ITCHING. ADVISED PATIENT TO CALL OFFICE BACK IF THERE IS ANY ISSUES -DR. S

## 2016-11-07 ENCOUNTER — Ambulatory Visit: Payer: Medicare Other

## 2016-11-09 ENCOUNTER — Ambulatory Visit (INDEPENDENT_AMBULATORY_CARE_PROVIDER_SITE_OTHER): Payer: Medicare Other

## 2016-11-09 VITALS — BP 130/70 | HR 88 | Temp 98.2°F | Resp 14 | Ht 63.0 in | Wt 239.0 lb

## 2016-11-09 DIAGNOSIS — Z0000 Encounter for general adult medical examination without abnormal findings: Secondary | ICD-10-CM

## 2016-11-09 DIAGNOSIS — H918X3 Other specified hearing loss, bilateral: Secondary | ICD-10-CM | POA: Diagnosis not present

## 2016-11-09 DIAGNOSIS — Z1231 Encounter for screening mammogram for malignant neoplasm of breast: Secondary | ICD-10-CM | POA: Diagnosis not present

## 2016-11-09 DIAGNOSIS — Z1239 Encounter for other screening for malignant neoplasm of breast: Secondary | ICD-10-CM

## 2016-11-09 NOTE — Progress Notes (Signed)
Care was provided under my supervision. I agree with the management as indicated in the note.  Signora Zucco DO  

## 2016-11-09 NOTE — Patient Instructions (Addendum)
Denise Sharp , Thank you for taking time to come for your Medicare Wellness Visit. I appreciate your ongoing commitment to your health goals. Please review the following plan we discussed and let me know if I can assist you in the future.   Follow up with [name] as needed.    Have a great day!  These are the goals we discussed: Goals    . Increase physical activity          Stay active. Chair exercises.    . Increase water intake          Stay hydrated.         This is a list of the screening recommended for you and due dates:  Health Maintenance  Topic Date Due  .  Hepatitis C: One time screening is recommended by Center for Disease Control  (CDC) for  adults born from [birth year] through 1965.   January 26, 1946  . Pneumonia vaccines (2 of 2 - PPSV23) 01/20/2015  . Mammogram  06/22/2016  . Flu Shot  06/15/2017*  . Hemoglobin A1C  12/28/2016  . Urine Protein Check  06/29/2017  . Eye exam for diabetics  09/17/2017  . Complete foot exam   11/05/2017  . Colon Cancer Screening  01/07/2024  . Tetanus Vaccine  04/18/2026  . DEXA scan (bone density measurement)  Completed  *Topic was postponed. The date shown is not the original due date.      Fall Prevention in the Home Falls can cause injuries. They can happen to people of all ages. There are many things you can do to make your home safe and to help prevent falls. What can I do on the outside of my home?  Regularly fix the edges of walkways and driveways and fix any cracks.  Remove anything that might make you trip as you walk through a door, such as a raised step or threshold.  Trim any bushes or trees on the path to your home.  Use bright outdoor lighting.  Clear any walking paths of anything that might make someone trip, such as rocks or tools.  Regularly check to see if handrails are loose or broken. Make sure that both sides of any steps have handrails.  Any raised decks and porches should have guardrails on the  edges.  Have any leaves, snow, or ice cleared regularly.  Use sand or salt on walking paths during winter.  Clean up any spills in your garage right away. This includes oil or grease spills. What can I do in the bathroom?  Use night lights.  Install grab bars by the toilet and in the tub and shower. Do not use towel bars as grab bars.  Use non-skid mats or decals in the tub or shower.  If you need to sit down in the shower, use a plastic, non-slip stool.  Keep the floor dry. Clean up any water that spills on the floor as soon as it happens.  Remove soap buildup in the tub or shower regularly.  Attach bath mats securely with double-sided non-slip rug tape.  Do not have throw rugs and other things on the floor that can make you trip. What can I do in the bedroom?  Use night lights.  Make sure that you have a light by your bed that is easy to reach.  Do not use any sheets or blankets that are too big for your bed. They should not hang down onto the floor.  Have a firm  chair that has side arms. You can use this for support while you get dressed.  Do not have throw rugs and other things on the floor that can make you trip. What can I do in the kitchen?  Clean up any spills right away.  Avoid walking on wet floors.  Keep items that you use a lot in easy-to-reach places.  If you need to reach something above you, use a strong step stool that has a grab bar.  Keep electrical cords out of the way.  Do not use floor polish or wax that makes floors slippery. If you must use wax, use non-skid floor wax.  Do not have throw rugs and other things on the floor that can make you trip. What can I do with my stairs?  Do not leave any items on the stairs.  Make sure that there are handrails on both sides of the stairs and use them. Fix handrails that are broken or loose. Make sure that handrails are as long as the stairways.  Check any carpeting to make sure that it is firmly  attached to the stairs. Fix any carpet that is loose or worn.  Avoid having throw rugs at the top or bottom of the stairs. If you do have throw rugs, attach them to the floor with carpet tape.  Make sure that you have a light switch at the top of the stairs and the bottom of the stairs. If you do not have them, ask someone to add them for you. What else can I do to help prevent falls?  Wear shoes that:  Do not have high heels.  Have rubber bottoms.  Are comfortable and fit you well.  Are closed at the toe. Do not wear sandals.  If you use a stepladder:  Make sure that it is fully opened. Do not climb a closed stepladder.  Make sure that both sides of the stepladder are locked into place.  Ask someone to hold it for you, if possible.  Clearly mark and make sure that you can see:  Any grab bars or handrails.  First and last steps.  Where the edge of each step is.  Use tools that help you move around (mobility aids) if they are needed. These include:  Canes.  Walkers.  Scooters.  Crutches.  Turn on the lights when you go into a dark area. Replace any light bulbs as soon as they burn out.  Set up your furniture so you have a clear path. Avoid moving your furniture around.  If any of your floors are uneven, fix them.  If there are any pets around you, be aware of where they are.  Review your medicines with your doctor. Some medicines can make you feel dizzy. This can increase your chance of falling. Ask your doctor what other things that you can do to help prevent falls. This information is not intended to replace advice given to you by your health care provider. Make sure you discuss any questions you have with your health care provider. Document Released: 04/28/2009 Document Revised: 12/08/2015 Document Reviewed: 08/06/2014 Elsevier Interactive Patient Education  2017 [employer]   Hearing Loss Hearing loss is a partial or total loss of the ability to hear.  This can be temporary or permanent, and it can happen in one or both ears. Hearing loss may be referred to as deafness. Medical care is necessary to treat hearing loss properly and to prevent the condition from getting worse. Your  hearing may partially or completely come back, depending on what caused your hearing loss and how severe it is. In some cases, hearing loss is permanent. What are the causes? Common causes of hearing loss include:  Too much wax in the ear canal.  Infection of the ear canal or middle ear.  Fluid in the middle ear.  Injury to the ear or surrounding area.  An object stuck in the ear.  Prolonged exposure to loud sounds, such as music. Less common causes of hearing loss include:  Tumors in the ear.  Viral or bacterial infections, such as meningitis.  A hole in the eardrum (perforated eardrum).  Problems with the hearing nerve that sends signals between the brain and the ear.  Certain medicines. What are the signs or symptoms? Symptoms of this condition may include:  Difficulty telling the difference between sounds.  Difficulty following a conversation when there is background noise.  Lack of response to sounds in your environment. This may be most noticeable when you do not respond to startling sounds.  Needing to turn up the volume on the television, radio, etc.  Ringing in the ears.  Dizziness.  Pain in the ears. How is this diagnosed? This condition is diagnosed based on a physical exam and a hearing test (audiometry). The audiometry test will be performed by a hearing specialist (audiologist). You may also be referred to an ear, nose, and throat (ENT) specialist (otolaryngologist). How is this treated? Treatment for recent onset of hearing loss may include:  Ear wax removal.  Being prescribed medicines to prevent infection (antibiotics).  Being prescribed medicines to reduce inflammation (corticosteroids). Follow these instructions at  home:  If you were prescribed an antibiotic medicine, take it as told by your health care provider. Do not stop taking the antibiotic even if you start to feel better.  Take over-the-counter and prescription medicines only as told by your health care provider.  Avoid loud noises.  Return to your normal activities as told by your health care provider. Ask your health care provider what activities are safe for you.  Keep all follow-up visits as told by your health care provider. This is important. Contact a health care provider if:  You feel dizzy.  You develop new symptoms.  You vomit or feel nauseous.  You have a fever. Get help right away if:  You develop sudden changes in your vision.  You have severe ear pain.  You have new or increased weakness.  You have a severe headache. This information is not intended to replace advice given to you by your health care provider. Make sure you discuss any questions you have with your health care provider. Document Released: 07/02/2005 Document Revised: 12/08/2015 Document Reviewed: 11/17/2014 Elsevier Interactive Patient Education  2017 [place] A mammogram is an X-ray of the breasts that is done to check for abnormal changes. This procedure can screen for and detect any changes that may suggest breast cancer. A mammogram can also identify other changes and variations in the breast, such as:  Inflammation of the breast tissue (mastitis).  An infected area that contains a collection of pus (abscess).  A fluid-filled sac (cyst).  Fibrocystic changes. This is when breast tissue becomes denser, which can make the tissue feel rope-like or uneven under the skin.  Tumors that are not cancerous (benign). Tell a health care provider about:  Any allergies you have.  If you have breast implants.  If you have had previous breast  disease, biopsy, or surgery.  If you are breastfeeding.  Any possibility that you could  be pregnant, if this applies.  If you are younger than age 66.  If you have a family history of breast cancer. What are the risks? Generally, this is a safe procedure. However, problems may occur, including:  Exposure to radiation. Radiation levels are very low with this test.  The results being misinterpreted.  The need for further tests.  The inability of the mammogram to detect certain cancers. What happens before the procedure?  Schedule your test about 1-2 weeks after your menstrual period. This is usually when your breasts are the least tender.  If you have had a mammogram done at a different facility in the past, get the mammogram X-rays or have them sent to your current exam facility in order to compare them.  Wash your breasts and under your arms the day of the test.  Do not wear deodorants, perfumes, lotions, or powders anywhere on your body on the day of the test.  Remove any jewelry from your neck.  Wear clothes that you can change into and out of easily. What happens during the procedure?  You will undress from the waist up and put on a gown.  You will stand in front of the X-ray machine.  Each breast will be placed between two plastic or glass plates. The plates will compress your breast for a few seconds. Try to stay as relaxed as possible during the procedure. This does not cause any harm to your breasts and any discomfort you feel will be very brief.  X-rays will be taken from different angles of each breast. The procedure may vary among health care providers and hospitals. What happens after the procedure?  The mammogram will be examined by a specialist (radiologist).  You may need to repeat certain parts of the test, depending on the quality of the images. This is commonly done if the radiologist needs a better view of the breast tissue.  Ask when your test results will be ready. Make sure you get your test results.  You may resume your normal  activities. This information is not intended to replace advice given to you by your health care provider. Make sure you discuss any questions you have with your health care provider. Document Released: 06/29/2000 Document Revised: 12/05/2015 Document Reviewed: 09/10/2014 Elsevier Interactive Patient Education  2017 [employer].

## 2016-11-09 NOTE — Progress Notes (Signed)
Subjective:   Denise Sharp is a 71 y.o. female who presents for Medicare Annual (Subsequent) preventive examination.  Review of Systems:  No ROS.  Medicare Wellness Visit.  Cardiac Risk Factors include: advanced age (>45men, >57 women);obesity (BMI >30kg/m2);diabetes mellitus;hypertension;sedentary lifestyle     Objective:     Vitals: BP 130/70 (BP Location: Left Arm, Patient Position: Sitting, Cuff Size: Normal)   Pulse 88   Temp 98.2 F (36.8 C) (Oral)   Resp 14   Ht 5\' 3"  (1.6 m)   Wt 239 lb (108.4 kg)   SpO2 95%   BMI 42.34 kg/m   Body mass index is 42.34 kg/m.   Tobacco History  Smoking Status  . Never Smoker  Smokeless Tobacco  . Never Used     Counseling given: Not Answered   Past Medical History:  Diagnosis Date  . Abnormality of gait 03/25/2013  . Adrenal mass ([place])   . Adrenal mass, left ([place])   . Anemia    iron deficient post  2 unit txfsn 2009, normal endo/colonoscopy by [facility]  . Apnea   . Arthritis   . Cervical spinal stenosis 1994   due to trauma to back ([employer] accident), has intermittent paralysis and parasthesias  . Cervicogenic headache 03/23/2014  . Depression   . Diabetes mellitus   . Dizziness    chronic dizziness  . Esophageal stenosis March 2011   with transietn outlet obstruction by food, cleared by EGD   . Headache(784.0)   . Hypertension   . IBS (irritable bowel syndrome)   . LBBB (left bundle branch block)   . Obesity   . Obstructive sleep apnea    using CPAP  . 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 Restless leg syndrome   . Syncope and collapse 03/12/2014   Past Surgical History:  Procedure Laterality Date  . ABDOMINAL HYSTERECTOMY    . APPENDECTOMY    . DIAPHRAGMATIC HERNIA REPAIR  2015  . [place]  . GALLBLADDER SURGERY  resection  . GASTRIC BYPASS    . GASTRIC BYPASS  2000, 2005   Dr. Debroah Loop  . [facility] IMPLANT PLACEMENT  April 2013   [place]  . JOINT REPLACEMENT  2007   bilateral  knee. [name],  [name]  . ROTATOR CUFF REPAIR     right  . [place]  . TOTAL ABDOMINAL HYSTERECTOMY W/ BILATERAL SALPINGOOPHORECTOMY  1974  . UMBILICAL HERNIA REPAIR  Aug 11, 2015   Family History  Problem Relation Age of Onset  . Heart disease Father   . Hypertension Father   . Prostate cancer Father   . Stroke Father   . Osteoporosis Father   . Stroke Mother   . Depression Mother   . Headache Mother   . Heart disease Mother   . Thyroid disease Mother   . Hypertension Mother   . Diabetes Daughter   . Heart disease Daughter   . Hypertension Daughter   . Hypertension Son    History  Sexual Activity  . Sexual activity: Not Currently    Outpatient Encounter Prescriptions as of 11/09/2016  Medication Sig  . albuterol (PROVENTIL HFA;VENTOLIN HFA) 108 (90 Base) MCG/ACT inhaler Inhale 2 puffs into the lungs every 8 (eight) hours as needed for wheezing or shortness of breath.  [name] amLODipine (NORVASC) 5 MG tablet Take 1 tablet (5 mg total) by mouth daily.  [name] aspirin EC 81 MG tablet Take 81 mg by mouth daily.  [name]  buPROPion (WELLBUTRIN SR) 100 MG 12 hr tablet Take 1 tablet (100 mg total) by mouth 2 (two) times daily.  . carvedilol (COREG) 3.125 MG tablet Take 1 tablet (3.125 mg total) by mouth 2 (two) times daily.  . cholestyramine light (PREVALITE) 4 GM/DOSE powder Take 4 g by mouth 2 (two) times daily.  . colestipol (COLESTID) 1 g tablet Take by mouth.  . diazepam (VALIUM) 10 MG tablet 1 TABLET DAILY AS NEEDED FOR ANXIETY OR INSOMNIA (Patient taking differently: Take 10 mg by mouth at bedtime. 1 TABLET DAILY AS NEEDED FOR ANXIETY OR INSOMNIA)  . diphenhydrAMINE (BENADRYL) 25 MG tablet Take 25 mg by mouth every 6 (six) hours as needed.  . hydrALAZINE (APRESOLINE) 25 MG tablet Take 1 tablet (25 mg total) by mouth 2 (two) times daily.  . hydrocodone-acetaminophen (ZYDONE) 5-400 MG tablet Take 1 tablet by mouth every 6 (six) hours as needed for pain.  . hyoscyamine  (LEVSIN SL) 0.125 MG SL tablet Place 0.125 mg under the tongue 2 (two) times daily.  [name] levETIRAcetam (KEPPRA) 500 MG tablet Take 1 tablet (500 mg total) by mouth 2 (two) times daily.  Johnnye Sima 2 MG TABS tablet Take 1 tablet (2 mg total) by mouth at bedtime as needed for sleep. Take immediately before bedtime  . ondansetron (ZOFRAN) 4 MG tablet Take 1 tablet (4 mg total) by mouth every 8 (eight) hours as needed for nausea or vomiting.  . pregabalin (LYRICA) 100 MG capsule Take 1 capsule (100 mg total) by mouth 2 (two) times daily.  [name] rOPINIRole (REQUIP) 1 MG tablet Take 1 tablet (1 mg total) by mouth at bedtime.  . rosuvastatin (CRESTOR) 10 MG tablet Take 10 mg by mouth daily.  . sitaGLIPtin (JANUVIA) 100 MG tablet Take 100 mg by mouth daily.  . tizanidine (ZANAFLEX) 2 MG capsule Take 2 mg by mouth 3 (three) times daily.  . traMADol (ULTRAM) 50 MG tablet Take 1 tablet (50 mg total) by mouth 4 (four) times daily as needed.  . [DISCONTINUED] carvedilol (COREG) 3.125 MG tablet Take 1 tablet (3.125 mg total) by mouth 2 (two) times daily.   No facility-administered encounter medications on file as of 11/09/2016.     Activities of Daily Living In your present state of health, do you have any difficulty performing the following activities: 11/09/2016  Hearing? Y  Vision? N  Difficulty concentrating or making decisions? Y  Walking or climbing stairs? Y  Dressing or bathing? N  Doing errands, shopping? N  Preparing Food and eating ? N  Using the Toilet? N  In the past six months, have you accidently leaked urine? N  Do you have problems with loss of bowel control? Y  Managing your Medications? N  Managing your Finances? N  Housekeeping or managing your Housekeeping? Y  Some recent data might be hidden    Patient Care Team: [name], MD as PCP - General (Internal Medicine) [facility] as Referring Physician    Assessment:    This is a routine wellness examination for [place].  The goal of the wellness visit is to assist the patient how to close the gaps in care and create a preventative care plan for the patient.   Osteoporosis risk reviewed.  Medications reviewed; taking without issues or barriers.  Safety issues reviewed; smoke detectors in the home. No firearms in the home. Wears seatbelts when driving or riding with others. Patient does wear sunscreen or protective clothing when in direct sunlight. No  violence in the home.  Patient is alert, normal appearance, oriented to person/place/and time. Correctly identified the president of the [place], recall of 3/3 words, and performing simple calculations.  Patient displays appropriate judgement and can read correct time from watch face.  No new identified risk were noted.  No failures at ADL's or IADL's. Cane in use when ambulating.  BMI- discussed the importance of a healthy diet, water intake and exercise. Educational material provided.   HTN- followed by PCP.  Dental- partial plate bottom, full plate top.  Eye- Visual acuity not assessed per patient preference since they have regular follow up with the ophthalmologist.  Sleep patterns-  Wakes feeling rested. CPAP in use.  Mammogram ordered; follow as directed.  Educational material provided.  Hepatitis C Screening discussed, educational material provided.  Pneumococcal vaccine 23 deferred per patient request.  Educational material provided.  Patient Concerns: None at this time. Follow up with PCP as needed.  Exercise Activities and Dietary recommendations Current Exercise Habits: The patient does not participate in regular exercise at present  Goals    . Increase physical activity          Stay active. Chair exercises.    . Increase water intake          Stay hydrated.        Fall Risk Fall Risk  11/09/2016 07/13/2016 11/03/2015 06/23/2014  Falls in the past year? Yes No Yes Yes  Number falls in past yr: 2 or more - 2 or more -  Injury  with Fall? No - No -  Risk Factor Category  High Fall Risk - - -  Risk for fall due to : History of fall(s) - Impaired balance/gait Impaired balance/gait;Impaired mobility  Follow up Education provided;Falls prevention discussed - Falls prevention discussed -   Depression Screen PHQ 2/9 Scores 11/09/2016 07/13/2016 11/03/2015 06/23/2014  PHQ - 2 Score 0 0 2 0  PHQ- 9 Score 0 - 4 -    Cognitive Function MMSE - Mini Mental State Exam 11/03/2015 11/03/2015  Orientation to time 5 5  Orientation to Place 5 5  Registration 3 3  Attention/ Calculation 5 5  Recall 3 3  Language- name 2 objects 2 2  Language- repeat 1 1  Language- follow 3 step command 3 3  Language- read & follow direction 1 1  Write a sentence 1 1  Copy design 1 1  Total score 30 30     6CIT Screen 11/09/2016  What Year? 0 points  What month? 0 points  What time? 0 points  Count back from 20 0 points  Months in reverse 0 points  Repeat phrase 6 points  Total Score 6    Immunization History  Administered Date(s) Administered  . Influenza Split 06/13/2011  . Influenza,inj,Quad PF,36+ Mos 05/04/2014  . Influenza-Unspecified 04/15/2012  . Pneumococcal Conjugate-13 01/06/2014  . Pneumococcal Polysaccharide-23 01/19/2010  . Tdap 04/18/2016   Screening Tests Health Maintenance  Topic Date Due  . Hepatitis C Screening  July 12, 1946  . PNA vac Low Risk Adult (2 of 2 - PPSV23) 01/20/2015  . MAMMOGRAM  06/22/2016  . INFLUENZA VACCINE  06/15/2017 (Originally 02/13/2017)  . HEMOGLOBIN A1C  12/28/2016  . URINE MICROALBUMIN  06/29/2017  . OPHTHALMOLOGY EXAM  09/17/2017  . FOOT EXAM  11/05/2017  . COLONOSCOPY  01/07/2024  . TETANUS/TDAP  04/18/2026  . DEXA SCAN  Completed      Plan:    End of life planning; Advance aging; Advanced  directives discussed. Copy of current HCPOA/Living Will on file.    Depression- she verbalizes and shows no signs of hopelessness, harm to self/others or little pleasure/interest in doing  things. Currently taking Wellbutrin 100mg  twice daily, as directed.  I encouraged her to contact her PCP if signs/symptoms present.  Verbalized understanding.  Time spent discussing this topic is 15 minutes.  I have personally reviewed and noted the following in the patient's chart:   . Medical and social history . Use of alcohol, tobacco or illicit drugs  . Current medications and supplements . Functional ability and status . Nutritional status . Physical activity . Advanced directives . List of other physicians . Hospitalizations, surgeries, and ER visits in previous 12 months . Vitals . Screenings to include cognitive, depression, and falls . Referrals and appointments  In addition, I have reviewed and discussed with patient certain preventive protocols, quality metrics, and best practice recommendations. A written personalized care plan for preventive services as well as general preventive health recommendations were provided to patient.     Varney Biles, LPN  04/13/2445

## 2016-11-12 ENCOUNTER — Ambulatory Visit: Payer: Medicare Other | Admitting: Podiatry

## 2016-11-12 ENCOUNTER — Other Ambulatory Visit: Payer: Self-pay | Admitting: Internal Medicine

## 2016-11-14 ENCOUNTER — Encounter: Payer: Self-pay | Admitting: Internal Medicine

## 2016-11-15 ENCOUNTER — Ambulatory Visit (INDEPENDENT_AMBULATORY_CARE_PROVIDER_SITE_OTHER): Payer: Medicare Other | Admitting: Podiatry

## 2016-11-15 DIAGNOSIS — E11621 Type 2 diabetes mellitus with foot ulcer: Secondary | ICD-10-CM

## 2016-11-15 DIAGNOSIS — L97529 Non-pressure chronic ulcer of other part of left foot with unspecified severity: Secondary | ICD-10-CM

## 2016-11-15 NOTE — Progress Notes (Signed)
Patient wants to self pay for diabetic shoes: [employer] 854 white 7.5 Wide...  Denise Sharp

## 2016-11-16 ENCOUNTER — Telehealth: Payer: Self-pay | Admitting: Internal Medicine

## 2016-11-16 NOTE — Telephone Encounter (Signed)
Pt calling stating she is going out of town and her BP for the last two days were low so she did not take any BP medications since Wednesday  11/14/16 90/40 11/15/16 83/38 Today 126/70  She is not sure if she should let it go and not take any or should take it  Please advise.

## 2016-11-16 NOTE — Telephone Encounter (Signed)
Patient was wanting [place] to know that she has been having low blood pressures and stopped taking her blood pressure medications the last couple of days. She is scheduled to go in on Monday to her PCP and they are going to take her blood pressure monitor to see how accurate the readings are. She is also coming to see Dr. Saunders Revel on Wednesday. Instructed her to continue monitoring her blood pressures and keep a log to bring into her doctor visits. Instructed her to call back if her blood pressure remains greater than 140/80 and let her know that I would forward this to Dr. Saunders Revel for him to review. She verbalized understanding of our conversation, agreement with plan, and had no further questions at this time.

## 2016-11-16 NOTE — Telephone Encounter (Signed)
That sounds reasonable.  Thanks.

## 2016-11-19 ENCOUNTER — Encounter: Payer: Self-pay | Admitting: Internal Medicine

## 2016-11-19 ENCOUNTER — Ambulatory Visit (INDEPENDENT_AMBULATORY_CARE_PROVIDER_SITE_OTHER): Payer: Medicare Other | Admitting: Internal Medicine

## 2016-11-19 VITALS — BP 122/74 | HR 72 | Temp 98.3°F | Resp 17 | Ht 63.0 in | Wt 235.4 lb

## 2016-11-19 DIAGNOSIS — E1142 Type 2 diabetes mellitus with diabetic polyneuropathy: Secondary | ICD-10-CM | POA: Diagnosis not present

## 2016-11-19 DIAGNOSIS — I872 Venous insufficiency (chronic) (peripheral): Secondary | ICD-10-CM

## 2016-11-19 DIAGNOSIS — E559 Vitamin D deficiency, unspecified: Secondary | ICD-10-CM | POA: Diagnosis not present

## 2016-11-19 DIAGNOSIS — G2581 Restless legs syndrome: Secondary | ICD-10-CM

## 2016-11-19 DIAGNOSIS — Z6841 Body Mass Index (BMI) 40.0 and over, adult: Secondary | ICD-10-CM

## 2016-11-19 DIAGNOSIS — I10 Essential (primary) hypertension: Secondary | ICD-10-CM | POA: Diagnosis not present

## 2016-11-19 DIAGNOSIS — D509 Iron deficiency anemia, unspecified: Secondary | ICD-10-CM | POA: Diagnosis not present

## 2016-11-19 DIAGNOSIS — F5101 Primary insomnia: Secondary | ICD-10-CM

## 2016-11-19 DIAGNOSIS — E034 Atrophy of thyroid (acquired): Secondary | ICD-10-CM

## 2016-11-19 DIAGNOSIS — R5383 Other fatigue: Secondary | ICD-10-CM | POA: Diagnosis not present

## 2016-11-19 LAB — VITAMIN D 25 HYDROXY (VIT D DEFICIENCY, FRACTURES): VITD: 23.44 ng/mL — ABNORMAL LOW (ref 30.00–100.00)

## 2016-11-19 LAB — COMPREHENSIVE METABOLIC PANEL
ALT: 13 U/L (ref 0–35)
AST: 13 U/L (ref 0–37)
Albumin: 4.1 g/dL (ref 3.5–5.2)
Alkaline Phosphatase: 116 U/L (ref 39–117)
BUN: 20 mg/dL (ref 6–23)
CO2: 27 mEq/L (ref 19–32)
Calcium: 9.5 mg/dL (ref 8.4–10.5)
Chloride: 105 mEq/L (ref 96–112)
Creatinine, Ser: 1.14 mg/dL (ref 0.40–1.20)
GFR: 49.92 mL/min — ABNORMAL LOW (ref >60.00)
Glucose, Bld: 128 mg/dL — ABNORMAL HIGH (ref 70–99)
Potassium: 4.6 mEq/L (ref 3.5–5.1)
Sodium: 139 mEq/L (ref 135–145)
Total Bilirubin: 0.3 mg/dL (ref 0.2–1.2)
Total Protein: 7.8 g/dL (ref 6.0–8.3)

## 2016-11-19 LAB — HEMOGLOBIN A1C: Hgb A1c MFr Bld: 7.4 % — ABNORMAL HIGH (ref 4.6–6.5)

## 2016-11-19 MED ORDER — LUNESTA 2 MG PO TABS: 2.0000 mg | ORAL_TABLET | Freq: Every evening | ORAL | 1 refills | Status: DC | PRN

## 2016-11-19 NOTE — Progress Notes (Addendum)
Subjective:  Patient ID: Denise Sharp, female    DOB: Apr 25, 1946  Age: 71 y.o. MRN: [id number]  CC: The primary encounter diagnosis was DM type 2 with diabetic peripheral neuropathy ([place]). Diagnoses of Essential hypertension, Vitamin D deficiency, Fatigue, unspecified type, Primary insomnia, Hypothyroidism due to acquired atrophy of thyroid, Restless legs syndrome, Iron deficiency anemia, unspecified iron deficiency anemia type, Venous stasis dermatitis of right lower extremity, and Class 3 severe obesity due to excess calories with serious comorbidity and body mass index (BMI) of 40.0 to 44.9 in adult [facility]) were also pertinent to this visit.  HPI Denise Sharp presents for follow up on chronic conditions including hypertension ,  Type 2 Diabetes  Chronic pain   Reporting "low grade fevers" (99 ) for the past week.  Attributd to cellutis of ankle.  Has been feelign lousy for the last week .   Last seen dec 29  Involuntary   weight loss of 6 lbs  BS 143 fasting,  Usually < 135 .  Other readings random  Andpost prandial < 160   Saw podiatry in April,  Rash to right ankle noted and venous stasis dermatitis diagnosed, ,  HC cream and compression hose recommended . Says that Dr Milinda Pointer put her to bed for two weeks to heal her leg and she has been p onl to urinate . (chart not reflective of this advice)   Now seeing [place] [facility]ology after being referred to [facility] for ER follow up.  Apparently  Dr Humphrey Rolls did a CT,  ECHO and nuc med study because of a  "LBBB" noted on ER EKG.  She sought a  Second opinion, and  Dr End  told her heart was fine.   Has had episodes of hypotension and has been suspending meds.  Home readings reviewed.  136/74 at night is the high ,  As low as 85 to 90 systolic .  Several medications started by Dr Humphrey Rolls including hydralazine with dose titrated upward from 25 mg bid.    Taking 10,000 Ius once weekly of vit d 3   Waiting to get fitted for compression stockings     Requesting refill on hydrocodone for  Leg and foot pain . No prior prescription from me;  refill denied.     Lab Results  Component Value Date   HGBA1C 7.4 (H) 11/19/2016       Outpatient Medications Prior to Visit  Medication Sig Dispense Refill  . albuterol (PROVENTIL HFA;VENTOLIN HFA) 108 (90 Base) MCG/ACT inhaler Inhale 2 puffs into the lungs every 8 (eight) hours as needed for wheezing or shortness of breath. 1 Inhaler 0  . amLODipine (NORVASC) 5 MG tablet Take 1 tablet (5 mg total) by mouth daily. 90 tablet 0  . aspirin EC 81 MG tablet Take 81 mg by mouth daily.    [name] buPROPion (WELLBUTRIN SR) 100 MG 12 hr tablet Take 1 tablet (100 mg total) by mouth 2 (two) times daily. 180 tablet 1  . carvedilol (COREG) 3.125 MG tablet TAKE 1 TABLET (3.125 MG TOTAL) BY MOUTH 2 (TWO) TIMES DAILY. 60 tablet 2  . cholestyramine light (PREVALITE) 4 GM/DOSE powder Take 4 g by mouth 2 (two) times daily.    . colestipol (COLESTID) 1 g tablet Take by mouth.    . diazepam (VALIUM) 10 MG tablet 1 TABLET DAILY AS NEEDED FOR ANXIETY OR INSOMNIA (Patient taking differently: Take 10 mg by mouth at bedtime. 1 TABLET DAILY AS NEEDED FOR ANXIETY  OR INSOMNIA) 30 tablet 5  . diphenhydrAMINE (BENADRYL) 25 MG tablet Take 25 mg by mouth every 6 (six) hours as needed.    . hydrocodone-acetaminophen (ZYDONE) 5-400 MG tablet Take 1 tablet by mouth every 6 (six) hours as needed for pain.    . hyoscyamine (LEVSIN SL) 0.125 MG SL tablet Place 0.125 mg under the tongue 2 (two) times daily.    [name] levETIRAcetam (KEPPRA) 500 MG tablet Take 1 tablet (500 mg total) by mouth 2 (two) times daily. 60 tablet 3  . ondansetron (ZOFRAN) 4 MG tablet Take 1 tablet (4 mg total) by mouth every 8 (eight) hours as needed for nausea or vomiting. 15 tablet 0  . pregabalin (LYRICA) 100 MG capsule Take 1 capsule (100 mg total) by mouth 2 (two) times daily.    [name] rOPINIRole (REQUIP) 1 MG tablet Take 1 tablet (1 mg total) by mouth at bedtime. 90  tablet 5  . rosuvastatin (CRESTOR) 10 MG tablet Take 10 mg by mouth daily.    . sitaGLIPtin (JANUVIA) 100 MG tablet Take 100 mg by mouth daily.    . tizanidine (ZANAFLEX) 2 MG capsule Take 2 mg by mouth 3 (three) times daily.    . traMADol (ULTRAM) 50 MG tablet Take 1 tablet (50 mg total) by mouth 4 (four) times daily as needed. 30 tablet 4  . hydrALAZINE (APRESOLINE) 25 MG tablet Take 1 tablet (25 mg total) by mouth 2 (two) times daily. 180 tablet 3  . LUNESTA 2 MG TABS tablet Take 1 tablet (2 mg total) by mouth at bedtime as needed for sleep. Take immediately before bedtime 90 tablet 1  . carvedilol (COREG) 3.125 MG tablet Take 1 tablet (3.125 mg total) by mouth 2 (two) times daily. (Patient not taking: Reported on 11/19/2016) 180 tablet 3   No facility-administered medications prior to visit.     Review of Systems;  Patient denies headache, fevers, malaise, unintentional weight loss, skin rash, eye pain, sinus congestion and sinus pain, sore throat, dysphagia,  hemoptysis , cough, dyspnea, wheezing, chest pain, palpitations, orthopnea, edema, abdominal pain, nausea, melena, diarrhea, constipation, flank pain, dysuria, hematuria, urinary  Frequency, nocturia, numbness, tingling, seizures,  Focal weakness, Loss of consciousness,  Tremor, insomnia, depression, anxiety, and suicidal ideation.      Objective:  BP 122/74 (BP Location: Left Arm, Patient Position: Sitting, Cuff Size: Normal)   Pulse 72   Temp 98.3 F (36.8 C) (Oral)   Resp 17   Ht 5\' 3"  (1.6 m)   Wt 235 lb 6.4 oz (106.8 kg)   SpO2 97%   BMI 41.70 kg/m   BP Readings from Last 3 Encounters:  11/21/16 140/74  11/19/16 122/74  11/09/16 130/70    Wt Readings from Last 3 Encounters:  11/21/16 235 lb 4 oz (106.7 kg)  11/19/16 235 lb 6.4 oz (106.8 kg)  11/09/16 239 lb (108.4 kg)    General appearance: alert, cooperative and appears stated age Ears: normal TM's and external ear canals both ears Throat: lips, mucosa, and  tongue normal; teeth and gums normal Neck: no adenopathy, no carotid bruit, supple, symmetrical, trachea midline and thyroid not enlarged, symmetric, no tenderness/mass/nodules Back: symmetric, no curvature. ROM normal. No CVA tenderness. Lungs: clear to auscultation bilaterally Heart: regular rate and rhythm, S1, S2 normal, no murmur, click, rub or gallop Abdomen: soft, non-tender; bowel sounds normal; no masses,  no organomegaly Pulses: 2+ and symmetric Skin: Skin color, texture, turgor normal. No rashes or lesions Lymph nodes: Cervical,  supraclavicular, and axillary nodes normal.  Lab Results  Component Value Date   HGBA1C 7.4 (H) 11/19/2016   HGBA1C 7.1 (H) 06/29/2016   HGBA1C 7.3 (H) 12/02/2015    Lab Results  Component Value Date   CREATININE 1.14 11/19/2016   CREATININE 1.10 (H) 08/03/2016   CREATININE 0.83 06/29/2016    Lab Results  Component Value Date   WBC 8.8 10/10/2016   HGB 13.4 10/10/2016   HCT 40.0 10/10/2016   PLT 312 10/10/2016   GLUCOSE 128 (H) 11/19/2016   CHOL 196 06/29/2016   TRIG 179.0 (H) 06/29/2016   HDL 55.80 06/29/2016   LDLDIRECT 111.0 06/16/2015   LDLCALC 105 (H) 06/29/2016   ALT 13 11/19/2016   AST 13 11/19/2016   NA 139 11/19/2016   K 4.6 11/19/2016   CL 105 11/19/2016   CREATININE 1.14 11/19/2016   BUN 20 11/19/2016   CO2 27 11/19/2016   TSH 4.400 06/16/2015   HGBA1C 7.4 (H) 11/19/2016   MICROALBUR 9.5 (H) 06/29/2016     Assessment & Plan:   Problem List Items Addressed This Visit    Vitamin D deficiency    Chronic, secondary to gastric bypass.  Continue weekly megadose.       Relevant Orders   VITAMIN D 25 Hydroxy (Vit-D Deficiency, Fractures) (Completed)   Venous stasis dermatitis of right lower extremity    Advised to start wearing compression stockings       Restless legs syndrome    Managed with requip      Obesity    Reviewed her previous success at weight loss,  I have addressed  BMI and recommended wt loss of  10% of body weigh over the next 6 months using a low glycemic index diet and regular exercise a minimum of 5 days per week.        Iron deficiency anemia    Resolved,  Continue iron supplements       RESOLVED: Hypothyroidism      Lab Results  Component Value Date   TSH 4.400 06/16/2015         Hypertension    Well controlled on current regimen. Renal function stable, no changes today.  Lab Results  Component Value Date   CREATININE 1.14 11/19/2016   Lab Results  Component Value Date   NA 139 11/19/2016   K 4.6 11/19/2016   CL 105 11/19/2016   CO2 27 11/19/2016         DM type 2 with diabetic peripheral neuropathy ([facility]) - Primary    slight Loss of  control with dietary changes.  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 Jacqualyn Posey with periodic checkups and use of therapeutic shoes.  She is requesting new shoes /annually   Lab Results  Component Value Date   HGBA1C 7.4 (H) 11/19/2016   Lab Results  Component Value Date   MICROALBUR 9.5 (H) 06/29/2016         Relevant Medications   LUNESTA 2 MG TABS tablet   Other Relevant Orders   Hemoglobin A1c (Completed)   Comprehensive metabolic panel (Completed)    Other Visit Diagnoses    Fatigue, unspecified type       Relevant Orders   Hepatitis C antibody (Completed)   Primary insomnia       Relevant Medications   LUNESTA 2 MG TABS tablet      I am having [name] start  on ergocalciferol. I am also having her maintain her traMADol, pregabalin, levETIRAcetam, ondansetron, hydrocodone-acetaminophen, sitaGLIPtin, cholestyramine light, hyoscyamine, albuterol, amLODipine, rOPINIRole, buPROPion, diazepam, rosuvastatin, aspirin EC, colestipol, tizanidine, diphenhydrAMINE, carvedilol, and LUNESTA.  Meds ordered this encounter  Medications  . LUNESTA 2 MG TABS tablet    Sig: Take 1 tablet (2 mg total) by mouth at bedtime  as needed for sleep. Take immediately before bedtime    Dispense:  90 tablet    Refill:  1    Patient tried and failed generic trial  . ergocalciferol (DRISDOL) 50000 units capsule    Sig: Take 1 capsule (50,000 Units total) by mouth once a week.    Dispense:  4 capsule    Refill:  2    Medications Discontinued During This Encounter  Medication Reason  . carvedilol (COREG) 1.610 MG tablet Duplicate  . LUNESTA 2 MG TABS tablet Reorder    Follow-up: Return in about 6 months (around 05/22/2017) for follow up diabetes.   [name], MD

## 2016-11-19 NOTE — Patient Instructions (Signed)
I have never refilled the hydrocodone so I cannot start now due to STOP laws.  I see no record of recent refills , so check your bottle

## 2016-11-19 NOTE — Progress Notes (Signed)
Pre visit review using our clinic review tool, if applicable. No additional management support is needed unless otherwise documented below in the visit note. 

## 2016-11-20 LAB — HEPATITIS C ANTIBODY: HCV Ab: NEGATIVE

## 2016-11-21 ENCOUNTER — Encounter: Payer: Self-pay | Admitting: Internal Medicine

## 2016-11-21 ENCOUNTER — Ambulatory Visit (INDEPENDENT_AMBULATORY_CARE_PROVIDER_SITE_OTHER): Payer: Medicare Other | Admitting: Internal Medicine

## 2016-11-21 VITALS — BP 140/74 | HR 76 | Ht 63.5 in | Wt 235.2 lb

## 2016-11-21 DIAGNOSIS — R0602 Shortness of breath: Secondary | ICD-10-CM | POA: Diagnosis not present

## 2016-11-21 DIAGNOSIS — I872 Venous insufficiency (chronic) (peripheral): Secondary | ICD-10-CM | POA: Insufficient documentation

## 2016-11-21 DIAGNOSIS — R0789 Other chest pain: Secondary | ICD-10-CM

## 2016-11-21 DIAGNOSIS — I10 Essential (primary) hypertension: Secondary | ICD-10-CM | POA: Diagnosis not present

## 2016-11-21 NOTE — Assessment & Plan Note (Signed)
   Lab Results  Component Value Date   TSH 4.400 06/16/2015

## 2016-11-21 NOTE — Assessment & Plan Note (Signed)
Resolved,  Continue iron supplements

## 2016-11-21 NOTE — Assessment & Plan Note (Signed)
Reviewed her previous success at weight loss,  I have addressed  BMI and recommended wt loss of 10% of body weigh over the next 6 months using a low glycemic index diet and regular exercise a minimum of 5 days per week.

## 2016-11-21 NOTE — Assessment & Plan Note (Signed)
Advised to start wearing compression stockings

## 2016-11-21 NOTE — Assessment & Plan Note (Addendum)
slight Loss of  control with dietary changes.  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 Jacqualyn Posey with periodic checkups and use of therapeutic shoes.  She is requesting new shoes /annually   Lab Results  Component Value Date   HGBA1C 7.4 (H) 11/19/2016   Lab Results  Component Value Date   MICROALBUR 9.5 (H) 06/29/2016

## 2016-11-21 NOTE — Progress Notes (Signed)
Follow-up Outpatient Visit Date: 11/21/2016  Primary Care Provider: [name], MD [address] [place] [address]  Chief Complaint: Fatigue and low blood pressures  HPI:  [name] is a 71 y.o. 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 I last saw her on 10/16/16. At that time, we agreed to decrease hydralazine and add carvedilol. Since that time, she has "felt lousy." She notes that her blood pressure has been low at times, with systolic pressures down to 83 mmHg. She also notes intermittent dizziness and light sensitivity. She has had some intermittent chest pains, which she attributes to acid reflux. She denies shortness of breath and palpitations. Her edema has improved since her last visit, with a corresponding 5 pound weight loss over the last week. He is trying to stay well-hydrated. She provides home blood pressure readings since her last visit of 108-141/38-76.  --------------------------------------------------------------------------------------------------  Cardiovascular History & Procedures: Cardiovascular Problems:  Intermittent left bundle branch block  Chronic atypical chest pain and shortness of breath  Risk Factors:  Hypertension, hyperlipidemia, diabetes mellitus, obesity, sedentary lifestyle, and age greater than 68  Cath/PCI:  LHC (07/24/12, [facility]): LMCA normal. LAD, LCx, and RCA with minor luminal irregularities.  LHC (04/15/86, [facility]): Normal  LMCA, LAD, LCx, and RCA. LVEF 65%.  CV Surgery:  None  EP Procedures and Devices:  None  Non-Invasive Evaluation(s):  Cardiac CT calcium score (09/03/16, Dr. Humphrey Rolls): Coronary calcium score 1548.  Pharmacologic myocardial perfusion stress test (08/08/16, Dr. Humphrey Rolls): "Equivocal stress test" with small inferior wall reversible defect and hyperdynamic LV contraction (EF 87%).  Transthoracic echocardiogram (08/06/16, Dr. Darrow Bussing): Technically difficult study with mildly dilated left ventricle. LVEF was normal with normal wall motion. Grade 1 diastolic dysfunction. Mildly dilated RV with normal contraction. Mitral annular calcification present with mild MR. Mild PR and TR also present. Normal pulmonary artery pressure. Severe left and mild right atrial enlargement.  Pharmacologic myocardial perfusion stress test (08/11/15, [facility]): Normal perfusion without ischemia or scar. LVEF 81%.  Recent CV Pertinent Labs: Lab Results  Component Value Date   CHOL 196 06/29/2016   CHOL 155 02/27/2014   HDL 55.80 06/29/2016   HDL 43 02/27/2014   LDLCALC 105 (H) 06/29/2016   LDLCALC 88 02/27/2014   LDLDIRECT 111.0 06/16/2015   TRIG 179.0 (H) 06/29/2016   TRIG 121 02/27/2014   CHOLHDL 4 06/29/2016   K 4.6 11/19/2016   K 4.0 06/21/2014   MG 1.5 (L) 08/05/2012   BUN 20 11/19/2016   BUN 11 10/20/2014   BUN 12 06/21/2014   CREATININE 1.14 11/19/2016   CREATININE 0.84 08/13/2014    Past medical and surgical history were reviewed and updated in EPIC.  Outpatient Encounter Prescriptions as of 11/21/2016  Medication Sig  . albuterol (PROVENTIL HFA;VENTOLIN HFA) 108 (90 Base) MCG/ACT inhaler Inhale 2 puffs into the lungs every 8 (eight) hours as needed for wheezing or shortness of breath.  [name] amLODipine (NORVASC) 5 MG tablet Take 1 tablet (5 mg total) by mouth daily.  [name] aspirin EC 81 MG tablet Take 81 mg by mouth daily.  [name] buPROPion [facility] SR)  100 MG 12 hr tablet Take 1 tablet (100 mg total) by mouth  2 (two) times daily.  . carvedilol (COREG) 3.125 MG tablet TAKE 1 TABLET (3.125 MG TOTAL) BY MOUTH 2 (TWO) TIMES DAILY.  . cholestyramine light (PREVALITE) 4 GM/DOSE powder Take 4 g by mouth 2 (two) times daily.  . colestipol (COLESTID) 1 g tablet Take by mouth.  . diazepam (VALIUM) 10 MG tablet 1 TABLET DAILY AS NEEDED FOR ANXIETY OR INSOMNIA (Patient taking differently: Take 10 mg by mouth at bedtime. 1 TABLET DAILY AS NEEDED FOR ANXIETY OR INSOMNIA)  . diphenhydrAMINE (BENADRYL) 25 MG tablet Take 25 mg by mouth every 6 (six) hours as needed.  . hydrALAZINE (APRESOLINE) 25 MG tablet Take 1 tablet (25 mg total) by mouth 2 (two) times daily.  . hydrocodone-acetaminophen (ZYDONE) 5-400 MG tablet Take 1 tablet by mouth every 6 (six) hours as needed for pain.  . hyoscyamine (LEVSIN SL) 0.125 MG SL tablet Place 0.125 mg under the tongue 2 (two) times daily.  [name] levETIRAcetam (KEPPRA) 500 MG tablet Take 1 tablet (500 mg total) by mouth 2 (two) times daily.  Denise Sharp 2 MG TABS tablet Take 1 tablet (2 mg total) by mouth at bedtime as needed for sleep. Take immediately before bedtime  . ondansetron (ZOFRAN) 4 MG tablet Take 1 tablet (4 mg total) by mouth every 8 (eight) hours as needed for nausea or vomiting.  . pregabalin (LYRICA) 100 MG capsule Take 1 capsule (100 mg total) by mouth 2 (two) times daily.  [name] rOPINIRole (REQUIP) 1 MG tablet Take 1 tablet (1 mg total) by mouth at bedtime.  . rosuvastatin (CRESTOR) 10 MG tablet Take 10 mg by mouth daily.  . sitaGLIPtin (JANUVIA) 100 MG tablet Take 100 mg by mouth daily.  . tizanidine (ZANAFLEX) 2 MG capsule Take 2 mg by mouth 3 (three) times daily.  . traMADol (ULTRAM) 50 MG tablet Take 1 tablet (50 mg total) by mouth 4 (four) times daily as needed.   No facility-administered encounter medications on file as of 11/21/2016.     Allergies: Diovan [valsartan]; Erythromycin; Flagyl [metronidazole]; Glucophage [metformin hcl]; Xanax [alprazolam]; Amoxicillin;  Demeclocycline; Erythromycin base; Metformin; Sulfa antibiotics; and Tetracyclines & related  Social History   Social History  . Marital status: Married    Spouse name: [name]   . Number of children: 2  . Years of education: College    Occupational History  .  Retired   Social History Main Topics  . Smoking status: Never Smoker  . Smokeless tobacco: Never Used  . Alcohol use No  . Drug use: No  . Sexual activity: Not Currently   Other Topics Concern  . Not on file   Social History Narrative   Patient lives at home with husband [name].    Patient has 2 children.    Patient has [employer].    Patient is on disability.    Patient is right handed.     Family History  Problem Relation Age of Onset  . Heart disease Father   . Hypertension Father   . Prostate cancer Father   . Stroke Father   . Osteoporosis Father   . Stroke Mother   . Depression Mother   . Headache Mother   . Heart disease Mother   . Thyroid disease Mother   . Hypertension Mother   . Diabetes Daughter   . Heart disease Daughter   . Hypertension Daughter   . Hypertension Son  Review of Systems: A 12-system review of systems was performed and was negative except as noted in the HPI.  --------------------------------------------------------------------------------------------------  Physical Exam: BP 140/74 (BP Location: Left Arm, Patient Position: Sitting, Cuff Size: Normal)   Pulse 76   Ht 5' 3.5" (1.613 m)   Wt 235 lb 4 oz (106.7 kg)   BMI 41.02 kg/m   General:  Morbidly obese, chronically ill-appearing woman seated in the exam room. HEENT: No conjunctival pallor or scleral icterus.  Moist mucous membranes.  OP clear. Neck: Supple without lymphadenopathy, thyromegaly, JVD, or HJR.  Lungs: Normal work of breathing. Mildly diminished breath sounds throughout without wheezes or crackles. Heart: Distant heart sounds with regular rate and rhythm. No murmurs, rubs, or gallops. Unable to  assess PMI due to body habitus. Abd: Bowel sounds present.  Soft, NT/ND. Unable to assess HSM due to body habitus. Ext: Trace pretibial edema bilaterally.  Radial, PT, and DP pulses are 2+ bilaterally. Skin: warm and dry without rash  Lab Results  Component Value Date   WBC 8.8 10/10/2016   HGB 13.4 10/10/2016   HCT 40.0 10/10/2016   MCV 88.1 10/10/2016   PLT 312 10/10/2016    Lab Results  Component Value Date   NA 139 11/19/2016   K 4.6 11/19/2016   CL 105 11/19/2016   CO2 27 11/19/2016   BUN 20 11/19/2016   CREATININE 1.14 11/19/2016   GLUCOSE 128 (H) 11/19/2016   ALT 13 11/19/2016    Lab Results  Component Value Date   CHOL 196 06/29/2016   HDL 55.80 06/29/2016   LDLCALC 105 (H) 06/29/2016   LDLDIRECT 111.0 06/16/2015   TRIG 179.0 (H) 06/29/2016   CHOLHDL 4 06/29/2016    --------------------------------------------------------------------------------------------------  ASSESSMENT AND PLAN: Atypical chest pain and shortness of breath These have been long-standing problems for the patient. Prior evaluations including multiple stress tests and 2 heart catheterizations as recently as 2014, have not shown any significant disease. Echocardiogram by Dr. Humphrey Rolls 07/2016 was notable for biatrial enlargement, mild mitral regurgitation, and grade 1 diastolic dysfunction. Though body habitus limits evaluation, she does not appear grossly volume overloaded today. I am concerned that intermittent low blood pressures at home may be contributing to some of her constitutional symptoms. We have therefore agreed to discontinue hydralazine. No further testing at this time.  Hypertension Blood pressure is mildly elevated today. However home readings are frequently low normal to decreased. Given symptoms of fatigue and lightheadedness, we have agreed to discontinue hydralazine. We will continue the remainder of her medications today. I will have the patient return in about 2 weeks for a blood  pressure check.  Follow-up: Return to clinic in 2 weeks for blood pressure check; return to see me in 3 months.  [name], MD 11/22/2016 8:54 PM

## 2016-11-21 NOTE — Assessment & Plan Note (Signed)
Managed with requip

## 2016-11-21 NOTE — Patient Instructions (Signed)
Medication Instructions:  Your physician has recommended you make the following change in your medication:  STOP taking hydralazine   Labwork: none  Testing/Procedures: none  Follow-Up: Your physician recommends that you schedule an appointment in 2 weeks for a blood pressure check Your physician recommends that you schedule a follow-up appointment in: 3 months with Dr. Saunders Revel.    Any Other Special Instructions Will Be Listed Below (If Applicable).     If you need a refill on your cardiac medications before your next appointment, please call your pharmacy.

## 2016-11-21 NOTE — Assessment & Plan Note (Signed)
Well controlled on current regimen. Renal function stable, no changes today.  Lab Results  Component Value Date   CREATININE 1.14 11/19/2016   Lab Results  Component Value Date   NA 139 11/19/2016   K 4.6 11/19/2016   CL 105 11/19/2016   CO2 27 11/19/2016

## 2016-11-22 ENCOUNTER — Encounter: Payer: Self-pay | Admitting: Internal Medicine

## 2016-11-22 MED ORDER — ERGOCALCIFEROL 1.25 MG (50000 UT) PO CAPS: 50000.0000 [IU] | ORAL_CAPSULE | ORAL | 2 refills | Status: DC

## 2016-11-22 NOTE — Addendum Note (Signed)
Addended by: [name] on: 11/22/2016 05:02 PM   Modules accepted: Orders

## 2016-11-22 NOTE — Assessment & Plan Note (Signed)
Chronic, secondary to gastric bypass.  Continue weekly megadose.

## 2016-11-23 ENCOUNTER — Other Ambulatory Visit: Payer: Self-pay | Admitting: Internal Medicine

## 2016-11-23 NOTE — Telephone Encounter (Signed)
Please review for refill. Thanks!  

## 2016-11-29 ENCOUNTER — Ambulatory Visit (INDEPENDENT_AMBULATORY_CARE_PROVIDER_SITE_OTHER): Payer: Medicare Other | Admitting: Podiatry

## 2016-11-29 DIAGNOSIS — M79673 Pain in unspecified foot: Secondary | ICD-10-CM

## 2016-11-29 DIAGNOSIS — E1149 Type 2 diabetes mellitus with other diabetic neurological complication: Secondary | ICD-10-CM

## 2016-11-29 NOTE — Progress Notes (Signed)
Patient came in today to p/up self pay diabetic shoes.Marland KitchenMarland KitchenMarland KitchenHer Orthofeet shoes fit her perfectly and she was well pleased with fit and function....  She is self pay as an admin charge 145.00....Dr. Stephenie Acres patient .

## 2016-12-05 ENCOUNTER — Ambulatory Visit
Admission: RE | Admit: 2016-12-05 | Discharge: 2016-12-05 | Disposition: A | Discharge status: Home / Self Care | Payer: Medicare Other | Source: Ambulatory Visit | Attending: Family Medicine | Admitting: Family Medicine

## 2016-12-05 DIAGNOSIS — Z1231 Encounter for screening mammogram for malignant neoplasm of breast: Secondary | ICD-10-CM | POA: Insufficient documentation

## 2016-12-05 DIAGNOSIS — Z1239 Encounter for other screening for malignant neoplasm of breast: Secondary | ICD-10-CM

## 2016-12-06 ENCOUNTER — Ambulatory Visit (INDEPENDENT_AMBULATORY_CARE_PROVIDER_SITE_OTHER): Payer: Medicare Other | Admitting: *Deleted

## 2016-12-06 ENCOUNTER — Telehealth: Payer: Self-pay | Admitting: *Deleted

## 2016-12-06 VITALS — BP 118/78 | HR 84 | Ht 64.0 in | Wt 235.0 lb

## 2016-12-06 DIAGNOSIS — I10 Essential (primary) hypertension: Secondary | ICD-10-CM | POA: Diagnosis not present

## 2016-12-06 DIAGNOSIS — H903 Sensorineural hearing loss, bilateral: Secondary | ICD-10-CM | POA: Diagnosis not present

## 2016-12-06 DIAGNOSIS — R42 Dizziness and giddiness: Secondary | ICD-10-CM | POA: Diagnosis not present

## 2016-12-06 NOTE — Telephone Encounter (Signed)
-----   Message from [name], MD sent at 12/06/2016  2:22 PM EDT -----   ----- Message ----- From: Vanessa Ralphs, RN Sent: 12/06/2016  10:15 AM To: [name], MD

## 2016-12-06 NOTE — Telephone Encounter (Signed)
Mailed unread message to patient. thanks 

## 2016-12-06 NOTE — Progress Notes (Signed)
Blood pressure is normal at 118/74. She should continue her current medications and f/u with [place] in about 2-3 months, as previously discussed.

## 2016-12-06 NOTE — Telephone Encounter (Signed)
Patient had BP check nurse visit today. Reviewed by Dr Saunders Revel as follows: [name], MD at 12/06/2016 9:10 AM   Status: Signed    Blood pressure is normal at 118/74. She should continue her current medications and f/u with [place] in about 2-3 months, as previously discussed.     Patient verbalized understanding and was very appreciative.

## 2016-12-06 NOTE — Progress Notes (Signed)
1.) Reason for visit: BP check, decreased BP  2.) Name of MD requesting visit: Dr Harrell Gave End  3.) H&P: hx atypical chest pain, HTN, HLD, DM, obesity  4.) ROS related to problem: Patient here to follow up for BP check. Office visit 11/21/16, Hydralazine was discontinued. Patient feels much better after the change in her medication. Going to ENT this afternoon for evaluation of dizziness. BP today 11/78, HR 84.  5.) Assessment and plan per MD: Patient advised to continue with current medications and follow up as scheduled in August with Dr End. Patient advised we will call her once Dr End reviews if he recommends any further orders. Patient verbalized understanding.

## 2016-12-07 ENCOUNTER — Inpatient Hospital Stay
Admission: RE | Admit: 2016-12-07 | Discharge: 2016-12-07 | Disposition: A | Discharge status: Home / Self Care | Payer: Self-pay | Source: Ambulatory Visit | Attending: *Deleted | Admitting: *Deleted

## 2016-12-07 ENCOUNTER — Other Ambulatory Visit: Payer: Self-pay | Admitting: *Deleted

## 2016-12-07 ENCOUNTER — Telehealth: Payer: Self-pay | Admitting: Internal Medicine

## 2016-12-07 DIAGNOSIS — Z9289 Personal history of other medical treatment: Secondary | ICD-10-CM

## 2016-12-07 MED ORDER — DIAZEPAM 10 MG PO TABS: 10.0000 mg | ORAL_TABLET | Freq: Every day | ORAL | 0 refills | Status: DC

## 2016-12-07 NOTE — Telephone Encounter (Signed)
Last office visit 11/19/16 Next office visit 05/22/17 Script sent to [facility]  Patient going out of town , previous script and won't arrive for 3 weeks  Please advise

## 2016-12-07 NOTE — Telephone Encounter (Signed)
3 week supply   #21 refilled.

## 2016-12-07 NOTE — Telephone Encounter (Signed)
Pt needs a refill on diazepam (VALIUM) 10 MG tablet.. Pt stated that it would not be ready at [facility] mail order for another 3weeks. Pt stated that she is leaving to go out of town.. Please advise

## 2016-12-07 NOTE — Telephone Encounter (Signed)
Spoke with pt to let her know that we were faxing over her medication and she stated that it needs to go to [facility] on [address], signed and faxed.

## 2016-12-13 ENCOUNTER — Ambulatory Visit: Payer: Medicare Other

## 2016-12-19 ENCOUNTER — Telehealth: Payer: Self-pay | Admitting: Internal Medicine

## 2016-12-19 NOTE — Telephone Encounter (Signed)
Pt called and stated that she never picked up her original rx for ergocalciferol (DRISDOL) 50000 units capsule. She asked that we please send over a new rx for it. Please advise, thank you!  Pharmacy - [id number] - [place], [place]  Call pt @ [phone].

## 2016-12-19 NOTE — Telephone Encounter (Signed)
LMTCB

## 2016-12-20 NOTE — Telephone Encounter (Signed)
Pt called back returning your call. Please advise, thank you!  Call pt @ [phone]

## 2016-12-20 NOTE — Telephone Encounter (Signed)
LMTCB

## 2016-12-21 NOTE — Telephone Encounter (Signed)
Spoke with pt and she stated that she has gotten her rx filled it jsut came through the [facility].

## 2017-01-03 DIAGNOSIS — M5136 Other intervertebral disc degeneration, lumbar region: Secondary | ICD-10-CM | POA: Diagnosis not present

## 2017-01-03 DIAGNOSIS — M9903 Segmental and somatic dysfunction of lumbar region: Secondary | ICD-10-CM | POA: Diagnosis not present

## 2017-01-03 DIAGNOSIS — M62830 Muscle spasm of back: Secondary | ICD-10-CM | POA: Diagnosis not present

## 2017-01-03 DIAGNOSIS — M5116 Intervertebral disc disorders with radiculopathy, lumbar region: Secondary | ICD-10-CM | POA: Diagnosis not present

## 2017-01-07 DIAGNOSIS — R42 Dizziness and giddiness: Secondary | ICD-10-CM | POA: Diagnosis not present

## 2017-01-07 DIAGNOSIS — M25561 Pain in right knee: Secondary | ICD-10-CM | POA: Diagnosis not present

## 2017-01-07 DIAGNOSIS — M6281 Muscle weakness (generalized): Secondary | ICD-10-CM | POA: Diagnosis not present

## 2017-01-07 DIAGNOSIS — R296 Repeated falls: Secondary | ICD-10-CM | POA: Diagnosis not present

## 2017-01-07 DIAGNOSIS — Z9181 History of falling: Secondary | ICD-10-CM | POA: Diagnosis not present

## 2017-01-07 DIAGNOSIS — M542 Cervicalgia: Secondary | ICD-10-CM | POA: Diagnosis not present

## 2017-01-07 DIAGNOSIS — M545 Low back pain: Secondary | ICD-10-CM | POA: Diagnosis not present

## 2017-01-08 DIAGNOSIS — M62830 Muscle spasm of back: Secondary | ICD-10-CM | POA: Diagnosis not present

## 2017-01-08 DIAGNOSIS — M5136 Other intervertebral disc degeneration, lumbar region: Secondary | ICD-10-CM | POA: Diagnosis not present

## 2017-01-08 DIAGNOSIS — M9903 Segmental and somatic dysfunction of lumbar region: Secondary | ICD-10-CM | POA: Diagnosis not present

## 2017-01-08 DIAGNOSIS — M5116 Intervertebral disc disorders with radiculopathy, lumbar region: Secondary | ICD-10-CM | POA: Diagnosis not present

## 2017-01-10 DIAGNOSIS — M5136 Other intervertebral disc degeneration, lumbar region: Secondary | ICD-10-CM | POA: Diagnosis not present

## 2017-01-10 DIAGNOSIS — M62830 Muscle spasm of back: Secondary | ICD-10-CM | POA: Diagnosis not present

## 2017-01-10 DIAGNOSIS — M9903 Segmental and somatic dysfunction of lumbar region: Secondary | ICD-10-CM | POA: Diagnosis not present

## 2017-01-10 DIAGNOSIS — M5116 Intervertebral disc disorders with radiculopathy, lumbar region: Secondary | ICD-10-CM | POA: Diagnosis not present

## 2017-01-11 DIAGNOSIS — M62830 Muscle spasm of back: Secondary | ICD-10-CM | POA: Diagnosis not present

## 2017-01-11 DIAGNOSIS — M5116 Intervertebral disc disorders with radiculopathy, lumbar region: Secondary | ICD-10-CM | POA: Diagnosis not present

## 2017-01-11 DIAGNOSIS — M5136 Other intervertebral disc degeneration, lumbar region: Secondary | ICD-10-CM | POA: Diagnosis not present

## 2017-01-11 DIAGNOSIS — M9903 Segmental and somatic dysfunction of lumbar region: Secondary | ICD-10-CM | POA: Diagnosis not present

## 2017-01-14 DIAGNOSIS — M5116 Intervertebral disc disorders with radiculopathy, lumbar region: Secondary | ICD-10-CM | POA: Diagnosis not present

## 2017-01-14 DIAGNOSIS — M9903 Segmental and somatic dysfunction of lumbar region: Secondary | ICD-10-CM | POA: Diagnosis not present

## 2017-01-14 DIAGNOSIS — M5136 Other intervertebral disc degeneration, lumbar region: Secondary | ICD-10-CM | POA: Diagnosis not present

## 2017-01-14 DIAGNOSIS — M62830 Muscle spasm of back: Secondary | ICD-10-CM | POA: Diagnosis not present

## 2017-01-18 ENCOUNTER — Ambulatory Visit: Payer: Medicare Other | Admitting: Neurology

## 2017-01-18 DIAGNOSIS — M6281 Muscle weakness (generalized): Secondary | ICD-10-CM | POA: Diagnosis not present

## 2017-01-18 DIAGNOSIS — R42 Dizziness and giddiness: Secondary | ICD-10-CM | POA: Diagnosis not present

## 2017-01-18 DIAGNOSIS — M5136 Other intervertebral disc degeneration, lumbar region: Secondary | ICD-10-CM | POA: Diagnosis not present

## 2017-01-18 DIAGNOSIS — M542 Cervicalgia: Secondary | ICD-10-CM | POA: Diagnosis not present

## 2017-01-18 DIAGNOSIS — R296 Repeated falls: Secondary | ICD-10-CM | POA: Diagnosis not present

## 2017-01-18 DIAGNOSIS — M9903 Segmental and somatic dysfunction of lumbar region: Secondary | ICD-10-CM | POA: Diagnosis not present

## 2017-01-18 DIAGNOSIS — M25561 Pain in right knee: Secondary | ICD-10-CM | POA: Diagnosis not present

## 2017-01-18 DIAGNOSIS — M5116 Intervertebral disc disorders with radiculopathy, lumbar region: Secondary | ICD-10-CM | POA: Diagnosis not present

## 2017-01-18 DIAGNOSIS — M62830 Muscle spasm of back: Secondary | ICD-10-CM | POA: Diagnosis not present

## 2017-01-18 DIAGNOSIS — Z9181 History of falling: Secondary | ICD-10-CM | POA: Diagnosis not present

## 2017-01-18 DIAGNOSIS — M545 Low back pain: Secondary | ICD-10-CM | POA: Diagnosis not present

## 2017-01-21 DIAGNOSIS — M5116 Intervertebral disc disorders with radiculopathy, lumbar region: Secondary | ICD-10-CM | POA: Diagnosis not present

## 2017-01-21 DIAGNOSIS — M9903 Segmental and somatic dysfunction of lumbar region: Secondary | ICD-10-CM | POA: Diagnosis not present

## 2017-01-21 DIAGNOSIS — M62830 Muscle spasm of back: Secondary | ICD-10-CM | POA: Diagnosis not present

## 2017-01-21 DIAGNOSIS — M5136 Other intervertebral disc degeneration, lumbar region: Secondary | ICD-10-CM | POA: Diagnosis not present

## 2017-01-23 ENCOUNTER — Ambulatory Visit (INDEPENDENT_AMBULATORY_CARE_PROVIDER_SITE_OTHER): Payer: Medicare Other | Admitting: Neurology

## 2017-01-23 ENCOUNTER — Encounter: Payer: Self-pay | Admitting: Neurology

## 2017-01-23 VITALS — BP 131/67 | HR 68 | Ht 64.0 in | Wt 243.0 lb

## 2017-01-23 DIAGNOSIS — G2581 Restless legs syndrome: Secondary | ICD-10-CM | POA: Diagnosis not present

## 2017-01-23 DIAGNOSIS — R269 Unspecified abnormalities of gait and mobility: Secondary | ICD-10-CM | POA: Diagnosis not present

## 2017-01-23 DIAGNOSIS — R51 Headache: Secondary | ICD-10-CM | POA: Diagnosis not present

## 2017-01-23 DIAGNOSIS — G4486 Cervicogenic headache: Secondary | ICD-10-CM

## 2017-01-23 DIAGNOSIS — E1142 Type 2 diabetes mellitus with diabetic polyneuropathy: Secondary | ICD-10-CM

## 2017-01-23 MED ORDER — DULOXETINE HCL 30 MG PO CPEP: 30.0000 mg | ORAL_CAPSULE | Freq: Two times a day (BID) | ORAL | 3 refills | Status: DC

## 2017-01-23 NOTE — Patient Instructions (Signed)
   Stop the Wellbutrin and start Cymbalta 30 mg one a day for 2 weeks, then take one twice a day.  Cymbalta (duloxetine) is an antidepressant medication that is commonly used for peripheral neuropathy pain or for fibromyalgia pain. As with any antidepressant medication, worsening depression can be seen. This medication can potentially cause headache, dizziness, sexual dysfunction, or nausea. If any problems are noted on this medication, please contact our office.

## 2017-01-23 NOTE — Progress Notes (Signed)
Reason for visit: Peripheral neuropathy  Denise Sharp is an 71 y.o. female  History of present illness:  Denise Sharp is a 71 year old right-handed white female with a history of diabetes and a diabetic peripheral neuropathy. The patient does have some cervical spondylosis and cervicogenic headache. The patient continues to have her headaches that are mainly on the right side of the neck and the head. The patient has a gait disturbance, she uses a cane for ambulation, she may fall on occasion. The patient does have discomfort in the feet associated with the neuropathy, she had to be taken off of her Lyrica because of excessive swelling in the ankles. She uses the Lyrica on occasion for pain, up to 2 or 3 times a week. The patient is seeing a chiropractor for her neck and back. She is in physical therapy for balance training. She will be getting hearing aids in the near future. The patient had an episode 3 or 4 weeks ago where she woke up from a nap and was not able to move the arms or legs, she was numb, she resolved her deficit in about 3 hours. A CT scan of the cervical spine have been done within the last year and a half and revealed some mild to moderate spinal stenosis at the C3-4 and C5-6 levels, no spinal cord compression was seen. The patient is unable to have MRI evaluation secondary to an InterStim stimulator placement.  Past Medical History:  Diagnosis Date  . Abnormality of gait 03/25/2013  . Adrenal mass ([place])   . Adrenal mass, left ([place])   . Anemia    iron deficient post  2 unit txfsn 2009, normal endo/colonoscopy by [facility]  . Apnea   . Arthritis   . Cervical spinal stenosis 1994   due to trauma to back ([employer] accident), has intermittent paralysis and parasthesias  . Cervicogenic headache 03/23/2014  . Depression   . Diabetes mellitus   . Dizziness    chronic dizziness  . Esophageal stenosis March 2011   with transietn outlet obstruction by food, cleared by EGD   .  Headache(784.0)   . Hypertension   . IBS (irritable bowel syndrome)   . LBBB (left bundle branch block)   . Left bundle branch block (LBBB)    Intermittently present  . Obesity   . Obstructive sleep apnea    using CPAP  . 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 Restless leg syndrome   . Syncope and collapse 03/12/2014    Past Surgical History:  Procedure Laterality Date  . ABDOMINAL HYSTERECTOMY    . APPENDECTOMY    . DIAPHRAGMATIC HERNIA REPAIR  2015  . [place]  . GALLBLADDER SURGERY  resection  . GASTRIC BYPASS    . GASTRIC BYPASS  2000, 2005   Dr. Debroah Loop  . [facility] IMPLANT PLACEMENT  April 2013   [place]  . JOINT REPLACEMENT  2007   bilateral knee. [name],  [name]  . ROTATOR CUFF REPAIR     right  . [place]  . TOTAL ABDOMINAL HYSTERECTOMY W/ BILATERAL SALPINGOOPHORECTOMY  1974  . UMBILICAL HERNIA REPAIR  Aug 11, 2015    Family History  Problem Relation Age of Onset  . Heart disease Father   . Hypertension Father   . Prostate cancer Father   . Stroke Father   . Osteoporosis Father   . Stroke Mother   . Depression Mother   . Headache Mother   .  Heart disease Mother   . Thyroid disease Mother   . Hypertension Mother   . Diabetes Daughter   . Heart disease Daughter   . Hypertension Daughter   . Hypertension Son     Social history:  reports that she has never smoked. She has never used smokeless tobacco. She reports that she does not drink alcohol or use drugs.    Allergies  Allergen Reactions  . Diovan [Valsartan]   . Erythromycin   . Flagyl [Metronidazole]   . Glucophage [Metformin Hcl]   . Xanax [Alprazolam]   . Amoxicillin Diarrhea  . Demeclocycline Hives  . Erythromycin Base   . Metformin   . Sulfa Antibiotics Rash    As child  . Tetracyclines & Related Hives and Rash    Medications:  Prior to Admission medications   Medication Sig Start Date End Date Taking? Authorizing Provider  albuterol  (PROVENTIL HFA;VENTOLIN HFA) 108 (90 Base) MCG/ACT inhaler Inhale 2 puffs into the lungs every 8 (eight) hours as needed for wheezing or shortness of breath. 05/29/16  Yes Burnard Hawthorne, FNP  amLODipine (NORVASC) 5 MG tablet Take 1 tablet (5 mg total) by mouth daily. 05/29/16  Yes Burnard Hawthorne, FNP  aspirin EC 81 MG tablet Take 81 mg by mouth daily.   Yes [provider]  benzonatate (TESSALON) 200 MG capsule Take 200 mg by mouth 3 (three) times daily as needed for cough.   Yes [provider]  buPROPion (WELLBUTRIN SR) 100 MG 12 hr tablet Take 1 tablet (100 mg total) by mouth 2 (two) times daily. 09/18/16  Yes [name], MD  carvedilol (COREG) 3.125 MG tablet TAKE 1 TABLET (3.125 MG TOTAL) BY MOUTH 2 (TWO) TIMES DAILY. 11/12/16 02/10/17 Yes End, Harrell Gave, MD  Cholecalciferol (VITAMIN D PO) Take 10,000 Units by mouth daily.   Yes [provider]  cholestyramine light (PREVALITE) 4 GM/DOSE powder Take 4 g by mouth 2 (two) times daily. 05/14/16  Yes [provider]  diazepam (VALIUM) 10 MG tablet Take 1 tablet (10 mg total) by mouth at bedtime. 1 TABLET DAILY AS NEEDED FOR ANXIETY OR INSOMNIA 12/07/16  Yes [name], MD  diphenhydrAMINE (BENADRYL) 25 MG tablet Take 25 mg by mouth every 6 (six) hours as needed.   Yes [provider]  hydrocodone-acetaminophen (ZYDONE) 5-400 MG tablet Take 1 tablet by mouth every 6 (six) hours as needed for pain.   Yes [provider]  hyoscyamine (LEVSIN SL) 0.125 MG SL tablet Place 0.125 mg under the tongue 2 (two) times daily. 05/14/16  Yes [provider]  levETIRAcetam (KEPPRA) 500 MG tablet Take 1 tablet (500 mg total) by mouth 2 (two) times daily. 03/22/16  Yes Kathrynn Ducking, MD  LUNESTA 2 MG TABS tablet Take 1 tablet (2 mg total) by mouth at bedtime as needed for sleep. Take immediately before bedtime 11/19/16  Yes [name], MD  ondansetron (ZOFRAN) 4 MG tablet Take 1 tablet  (4 mg total) by mouth every 8 (eight) hours as needed for nausea or vomiting. 04/03/16 04/03/17 Yes Eula Listen, MD  pregabalin (LYRICA) 100 MG capsule Take 1 capsule (100 mg total) by mouth 2 (two) times daily. 03/22/16  Yes Kathrynn Ducking, MD  rOPINIRole (REQUIP) 1 MG tablet Take 1 tablet (1 mg total) by mouth at bedtime. 08/15/16  Yes [name], MD  rosuvastatin (CRESTOR) 10 MG tablet TAKE 1 TABLET BY MOUTH EVERY DAY 11/23/16  Yes End,  Harrell Gave, MD  sitaGLIPtin (JANUVIA) 100 MG tablet Take 100 mg by mouth daily.   Yes [provider]  tizanidine (ZANAFLEX) 2 MG capsule Take 2 mg by mouth 3 (three) times daily.   Yes [provider]  traMADol (ULTRAM) 50 MG tablet Take 1 tablet (50 mg total) by mouth 4 (four) times daily as needed. 02/14/16  Yes [name], MD    ROS:  Out of a complete 14 system review of symptoms, the patient complains only of the following symptoms, and all other reviewed systems are negative.  Headache Gait disorder  Blood pressure 131/67, pulse 68, height 5\' 4"  (1.626 m), weight 243 lb (110.2 kg).  Physical Exam  General: The patient is alert and cooperative at the time of the examination. The patient is markedly obese.  Skin: 1+ edema at the ankles is noted bilaterally.   Neurologic Exam  Mental status: The patient is alert and oriented x 3 at the time of the examination. The patient has apparent normal recent and remote memory, with an apparently normal attention span and concentration ability.   Cranial nerves: Facial symmetry is present. Speech is normal, no aphasia or dysarthria is noted. Extraocular movements are full. Visual fields are full.  Motor: The patient has good strength in all 4 extremities.  Sensory examination: Soft touch sensation is slightly decreased on the right face, arm, and leg.  Coordination: The patient has good finger-nose-finger and heel-to-shin bilaterally.  Gait and station: The patient  has a slightly wide-based gait, the patient uses a cane for ambulation. Tandem gait was not attempted. Romberg is negative. No drift is seen.  Reflexes: Deep tendon reflexes are symmetric, but are depressed.   Assessment/Plan:  1. Diabetes  2. Diabetic peripheral neuropathy  3. Gait disorder  4. Cervical spondylosis  5. Cervicogenic headache  The patient will be taken off of Wellbutrin and placed on Cymbalta for her peripheral neuropathy, and cervicogenic headache. The patient will continue her physical therapy for gait training, she will follow-up in 6 months. She will contact me for a dose adjustment of the medication.  Jill Alexanders MD 01/23/2017 10:24 AM  [facility] [address] [place], [place] [address]  Phone [phone] Fax [phone]

## 2017-01-25 DIAGNOSIS — M9903 Segmental and somatic dysfunction of lumbar region: Secondary | ICD-10-CM | POA: Diagnosis not present

## 2017-01-25 DIAGNOSIS — M5136 Other intervertebral disc degeneration, lumbar region: Secondary | ICD-10-CM | POA: Diagnosis not present

## 2017-01-25 DIAGNOSIS — M5116 Intervertebral disc disorders with radiculopathy, lumbar region: Secondary | ICD-10-CM | POA: Diagnosis not present

## 2017-01-25 DIAGNOSIS — M62830 Muscle spasm of back: Secondary | ICD-10-CM | POA: Diagnosis not present

## 2017-01-30 DIAGNOSIS — M5136 Other intervertebral disc degeneration, lumbar region: Secondary | ICD-10-CM | POA: Diagnosis not present

## 2017-01-30 DIAGNOSIS — M5116 Intervertebral disc disorders with radiculopathy, lumbar region: Secondary | ICD-10-CM | POA: Diagnosis not present

## 2017-01-30 DIAGNOSIS — M62830 Muscle spasm of back: Secondary | ICD-10-CM | POA: Diagnosis not present

## 2017-01-30 DIAGNOSIS — M9903 Segmental and somatic dysfunction of lumbar region: Secondary | ICD-10-CM | POA: Diagnosis not present

## 2017-01-30 NOTE — Progress Notes (Signed)
[name]  [phone] [phone]  ID: Denise Sharp OB: May 21, 1946  MR#: [id number]  PZW#:[id number]  Patient Care Team: [name], MD as PCP - General (Internal Medicine) Chiropractic, Freddi Che as Referring Physician  CHIEF COMPLAINT: Iron deficiency anemia.  INTERVAL HISTORY: Patient returns to clinic today for repeat laboratory work and further evaluation. She has chronic weakness and fatigue, but has multiple personal issues that are contributing. She otherwise feels well. She has no neurologic complaints. She denies any recent fevers or illnesses. She has a good appetite and denies weight loss. She denies any chest pain or shortness of breath. She denies any nausea, vomiting, constipation, or diarrhea. She has no melena or hematochezia. She has no urinary complaints. Patient offers no further specific complaints today.  REVIEW OF SYSTEMS:   Review of Systems  Constitutional: Positive for malaise/fatigue. Negative for fever and weight loss.  Respiratory: Negative.  Negative for cough and shortness of breath.   Cardiovascular: Negative.  Negative for chest pain and leg swelling.  Gastrointestinal: Negative.  Negative for abdominal pain, blood in stool and melena.  Genitourinary: Negative.   Musculoskeletal: Negative.   Skin: Negative.  Negative for rash.  Neurological: Positive for weakness.  Psychiatric/Behavioral: Negative.  The patient is not nervous/anxious.     As per HPI. Otherwise, a complete review of systems is negative.  PAST MEDICAL HISTORY: Past Medical History:  Diagnosis Date  . Abnormality of gait 03/25/2013  . Adrenal mass ([place])   . Adrenal mass, left ([place])   . Anemia    iron deficient post  2 unit txfsn 2009, normal endo/colonoscopy by [facility]  . Apnea   . Arthritis   . Cervical spinal stenosis 1994   due to trauma to back ([employer] accident), has intermittent paralysis and parasthesias  . Cervicogenic headache  03/23/2014  . Depression   . Diabetes mellitus   . Dizziness    chronic dizziness  . Esophageal stenosis March 2011   with transietn outlet obstruction by food, cleared by EGD   . Headache(784.0)   . Hypertension   . IBS (irritable bowel syndrome)   . LBBB (left bundle branch block)   . Left bundle branch block (LBBB)    Intermittently present  . Obesity   . Obstructive sleep apnea    using CPAP  . 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 Restless leg syndrome   . Syncope and collapse 03/12/2014    PAST SURGICAL HISTORY: Past Surgical History:  Procedure Laterality Date  . ABDOMINAL HYSTERECTOMY    . APPENDECTOMY    . DIAPHRAGMATIC HERNIA REPAIR  2015  . [place]  . GALLBLADDER SURGERY  resection  . GASTRIC BYPASS    . GASTRIC BYPASS  2000, 2005   Dr. Debroah Loop  . [facility] IMPLANT PLACEMENT  April 2013   [place]  . JOINT REPLACEMENT  2007   bilateral knee. [name],  [name]  . ROTATOR CUFF REPAIR     right  . [place]  . TOTAL ABDOMINAL HYSTERECTOMY W/ BILATERAL SALPINGOOPHORECTOMY  1974  . UMBILICAL HERNIA REPAIR  Aug 11, 2015    FAMILY HISTORY: Family History  Problem Relation Age of Onset  . Heart disease Father   . Hypertension Father   . Prostate cancer Father   . Stroke Father   . Osteoporosis Father   . Stroke Mother   . Depression Mother   . Headache Mother   . Heart disease Mother   .  Thyroid disease Mother   . Hypertension Mother   . Diabetes Daughter   . Heart disease Daughter   . Hypertension Daughter   . Hypertension Son     ADVANCED DIRECTIVES (Y/N):  N  HEALTH MAINTENANCE: Social History  Substance Use Topics  . Smoking status: Never Smoker  . Smokeless tobacco: Never Used  . Alcohol use No     Colonoscopy:  PAP:  Bone density:  Lipid panel:  Allergies  Allergen Reactions  . Diovan [Valsartan]   . Erythromycin   . Flagyl [Metronidazole]   . Glucophage [Metformin Hcl]   . Xanax  [Alprazolam]   . Amoxicillin Diarrhea  . Demeclocycline Hives  . Erythromycin Base   . Metformin   . Sulfa Antibiotics Rash    As child  . Tetracyclines & Related Hives and Rash    Current Outpatient Prescriptions  Medication Sig Dispense Refill  . albuterol (PROVENTIL HFA;VENTOLIN HFA) 108 (90 Base) MCG/ACT inhaler Inhale 2 puffs into the lungs every 8 (eight) hours as needed for wheezing or shortness of breath. 1 Inhaler 0  . amLODipine (NORVASC) 5 MG tablet Take 1 tablet (5 mg total) by mouth daily. 90 tablet 0  . aspirin EC 81 MG tablet Take 81 mg by mouth daily.    . carvedilol (COREG) 3.125 MG tablet TAKE 1 TABLET (3.125 MG TOTAL) BY MOUTH 2 (TWO) TIMES DAILY. 60 tablet 2  . Cholecalciferol (VITAMIN D PO) Take 10,000 Units by mouth daily.    . cholestyramine light (PREVALITE) 4 GM/DOSE powder Take 4 g by mouth 2 (two) times daily.    . diazepam (VALIUM) 10 MG tablet Take 1 tablet (10 mg total) by mouth at bedtime. 1 TABLET DAILY AS NEEDED FOR ANXIETY OR INSOMNIA 21 tablet 0  . diphenhydrAMINE (BENADRYL) 25 MG tablet Take 25 mg by mouth every 6 (six) hours as needed.    . DULoxetine (CYMBALTA) 30 MG capsule Take 1 capsule (30 mg total) by mouth 2 (two) times daily. 60 capsule 3  . hyoscyamine (LEVSIN SL) 0.125 MG SL tablet Place 0.125 mg under the tongue 2 (two) times daily.    [name] levETIRAcetam (KEPPRA) 500 MG tablet Take 1 tablet (500 mg total) by mouth 2 (two) times daily. 60 tablet 3  . LUNESTA 2 MG TABS tablet Take 1 tablet (2 mg total) by mouth at bedtime as needed for sleep. Take immediately before bedtime 90 tablet 1  . ondansetron (ZOFRAN) 4 MG tablet Take 1 tablet (4 mg total) by mouth every 8 (eight) hours as needed for nausea or vomiting. 15 tablet 0  . pregabalin (LYRICA) 100 MG capsule Take 1 capsule (100 mg total) by mouth 2 (two) times daily.    [name] rOPINIRole (REQUIP) 1 MG tablet Take 1 tablet (1 mg total) by mouth at bedtime. 90 tablet 5  . rosuvastatin (CRESTOR) 10 MG  tablet TAKE 1 TABLET BY MOUTH EVERY DAY 30 tablet 0  . sitaGLIPtin (JANUVIA) 100 MG tablet Take 100 mg by mouth daily.    . tizanidine (ZANAFLEX) 2 MG capsule Take 2 mg by mouth 3 (three) times daily.    . traMADol (ULTRAM) 50 MG tablet Take 1 tablet (50 mg total) by mouth 4 (four) times daily as needed. 30 tablet 4  . benzonatate (TESSALON) 200 MG capsule Take 200 mg by mouth 3 (three) times daily as needed for cough.    . hydrocodone-acetaminophen (ZYDONE) 5-400 MG tablet Take 1 tablet by mouth every 6 (six) hours as  needed for pain.     No current facility-administered medications for this visit.     OBJECTIVE: Vitals:   01/31/17 1446  BP: 126/83  Pulse: 72  Resp: 18  Temp: 98.8 F (37.1 C)     Body mass index is 40.34 kg/m.    ECOG FS:0 - Asymptomatic  General: Well-developed, well-nourished, no acute distress. Eyes: Pink conjunctiva, anicteric sclera. Lungs: Clear to auscultation bilaterally. Heart: Regular rate and rhythm. No rubs, murmurs, or gallops. Abdomen: Soft, nontender, nondistended. No organomegaly noted, normoactive bowel sounds. Musculoskeletal: No edema, cyanosis, or clubbing. Neuro: Alert, answering all questions appropriately. Cranial nerves grossly intact. Skin: No rashes or petechiae noted. Psych: Normal affect.  LAB RESULTS:  Lab Results  Component Value Date   NA 139 11/19/2016   K 4.6 11/19/2016   CL 105 11/19/2016   CO2 27 11/19/2016   GLUCOSE 128 (H) 11/19/2016   BUN 20 11/19/2016   CREATININE 1.14 11/19/2016   CALCIUM 9.5 11/19/2016   PROT 7.8 11/19/2016   ALBUMIN 4.1 11/19/2016   AST 13 11/19/2016   ALT 13 11/19/2016   ALKPHOS 116 11/19/2016   BILITOT 0.3 11/19/2016   GFRNONAA 50 (L) 08/03/2016   GFRAA 58 (L) 08/03/2016    Lab Results  Component Value Date   WBC 9.9 01/31/2017   NEUTROABS 7.3 (H) 01/31/2017   HGB 13.3 01/31/2017   HCT 39.9 01/31/2017   MCV 87.0 01/31/2017   PLT 298 01/31/2017   Lab Results  Component Value  Date   IRON 50 01/31/2017   TIBC 324 01/31/2017   IRONPCTSAT 15 01/31/2017   Lab Results  Component Value Date   FERRITIN 68 01/31/2017     STUDIES: No results found.  ASSESSMENT: Iron deficiency anemia.  PLAN:   1. Iron deficiency anemia: Likely secondary to poor absorption secondary to history of gastric bypass. Patient's hemoglobin and iron stores continue to be within normal limits. She does not require additional IV iron. Patient last received Feraheme on January 19, 2016. No further intervention is needed. After lengthy discussion with the patient, it was agreed upon that no further follow-up is necessary. Please refer patient back if there are any questions or concerns.   Approximately 20 minutes was spent in discussion of which greater than 50% was consultation.   Patient expressed understanding and was in agreement with this plan. She also understands that She can call clinic at any time with any questions, concerns, or complaints.    Lloyd Huger, MD   02/04/2017 6:58 AM

## 2017-01-31 ENCOUNTER — Inpatient Hospital Stay: Payer: Medicare Other

## 2017-01-31 ENCOUNTER — Inpatient Hospital Stay: Payer: Medicare Other | Attending: Oncology | Admitting: Oncology

## 2017-01-31 VITALS — BP 126/83 | HR 72 | Temp 98.8°F | Resp 18 | Wt 235.0 lb

## 2017-01-31 DIAGNOSIS — R42 Dizziness and giddiness: Secondary | ICD-10-CM | POA: Insufficient documentation

## 2017-01-31 DIAGNOSIS — D509 Iron deficiency anemia, unspecified: Secondary | ICD-10-CM | POA: Insufficient documentation

## 2017-01-31 DIAGNOSIS — G2581 Restless legs syndrome: Secondary | ICD-10-CM | POA: Insufficient documentation

## 2017-01-31 DIAGNOSIS — Z8042 Family history of malignant neoplasm of prostate: Secondary | ICD-10-CM | POA: Insufficient documentation

## 2017-01-31 DIAGNOSIS — R5383 Other fatigue: Secondary | ICD-10-CM | POA: Insufficient documentation

## 2017-01-31 DIAGNOSIS — R269 Unspecified abnormalities of gait and mobility: Secondary | ICD-10-CM | POA: Insufficient documentation

## 2017-01-31 DIAGNOSIS — I10 Essential (primary) hypertension: Secondary | ICD-10-CM | POA: Insufficient documentation

## 2017-01-31 DIAGNOSIS — E279 Disorder of adrenal gland, unspecified: Secondary | ICD-10-CM | POA: Diagnosis not present

## 2017-01-31 DIAGNOSIS — Z7982 Long term (current) use of aspirin: Secondary | ICD-10-CM | POA: Diagnosis not present

## 2017-01-31 DIAGNOSIS — F329 Major depressive disorder, single episode, unspecified: Secondary | ICD-10-CM | POA: Insufficient documentation

## 2017-01-31 DIAGNOSIS — E669 Obesity, unspecified: Secondary | ICD-10-CM | POA: Insufficient documentation

## 2017-01-31 DIAGNOSIS — E119 Type 2 diabetes mellitus without complications: Secondary | ICD-10-CM | POA: Diagnosis not present

## 2017-01-31 DIAGNOSIS — M4802 Spinal stenosis, cervical region: Secondary | ICD-10-CM | POA: Insufficient documentation

## 2017-01-31 DIAGNOSIS — R531 Weakness: Secondary | ICD-10-CM | POA: Insufficient documentation

## 2017-01-31 DIAGNOSIS — Z79899 Other long term (current) drug therapy: Secondary | ICD-10-CM | POA: Insufficient documentation

## 2017-01-31 DIAGNOSIS — G629 Polyneuropathy, unspecified: Secondary | ICD-10-CM | POA: Insufficient documentation

## 2017-01-31 DIAGNOSIS — I447 Left bundle-branch block, unspecified: Secondary | ICD-10-CM | POA: Insufficient documentation

## 2017-01-31 DIAGNOSIS — G4730 Sleep apnea, unspecified: Secondary | ICD-10-CM | POA: Insufficient documentation

## 2017-01-31 DIAGNOSIS — M129 Arthropathy, unspecified: Secondary | ICD-10-CM | POA: Diagnosis not present

## 2017-01-31 LAB — CBC WITH DIFFERENTIAL/PLATELET
Basophils Absolute: 0.1 10*3/uL (ref 0–0.1)
Basophils Relative: 1 %
Eosinophils Absolute: 0.1 10*3/uL (ref 0–0.7)
Eosinophils Relative: 1 %
HCT: 39.9 % (ref 35.0–47.0)
Hemoglobin: 13.3 g/dL (ref 12.0–16.0)
Lymphocytes Relative: 18 %
Lymphs Abs: 1.8 10*3/uL (ref 1.0–3.6)
MCH: 28.9 pg (ref 26.0–34.0)
MCHC: 33.3 g/dL (ref 32.0–36.0)
MCV: 87 fL (ref 80.0–100.0)
Monocytes Absolute: 0.6 10*3/uL (ref 0.2–0.9)
Monocytes Relative: 6 %
Neutro Abs: 7.3 10*3/uL — ABNORMAL HIGH (ref 1.4–6.5)
Neutrophils Relative %: 74 %
Platelets: 298 10*3/uL (ref 150–440)
RBC: 4.58 MIL/uL (ref 3.80–5.20)
RDW: 15.3 % — ABNORMAL HIGH (ref 11.5–14.5)
WBC: 9.9 10*3/uL (ref 3.6–11.0)

## 2017-01-31 LAB — FERRITIN: Ferritin: 68 ng/mL (ref 11–307)

## 2017-01-31 LAB — IRON AND TIBC
Iron: 50 ug/dL (ref 28–170)
Saturation Ratios: 15 % (ref 10.4–31.8)
TIBC: 324 ug/dL (ref 250–450)
UIBC: 274 ug/dL

## 2017-02-04 ENCOUNTER — Ambulatory Visit (INDEPENDENT_AMBULATORY_CARE_PROVIDER_SITE_OTHER): Payer: Medicare Other | Admitting: Podiatry

## 2017-02-04 DIAGNOSIS — M79676 Pain in unspecified toe(s): Secondary | ICD-10-CM | POA: Diagnosis not present

## 2017-02-04 DIAGNOSIS — M5116 Intervertebral disc disorders with radiculopathy, lumbar region: Secondary | ICD-10-CM | POA: Diagnosis not present

## 2017-02-04 DIAGNOSIS — M129 Arthropathy, unspecified: Secondary | ICD-10-CM

## 2017-02-04 DIAGNOSIS — B351 Tinea unguium: Secondary | ICD-10-CM | POA: Diagnosis not present

## 2017-02-04 DIAGNOSIS — M62830 Muscle spasm of back: Secondary | ICD-10-CM | POA: Diagnosis not present

## 2017-02-04 DIAGNOSIS — M9903 Segmental and somatic dysfunction of lumbar region: Secondary | ICD-10-CM | POA: Diagnosis not present

## 2017-02-04 DIAGNOSIS — E1149 Type 2 diabetes mellitus with other diabetic neurological complication: Secondary | ICD-10-CM

## 2017-02-04 DIAGNOSIS — M5136 Other intervertebral disc degeneration, lumbar region: Secondary | ICD-10-CM | POA: Diagnosis not present

## 2017-02-04 NOTE — Progress Notes (Signed)
Complaint:  Visit Type: Patient returns to my office for continued preventative foot care services. Complaint: Patient states" my nails have grown long and thick and become painful to walk and wear shoes" Patient has been diagnosed with DM with no foot complications. The patient presents for preventative foot care services. No changes to ROS.  Patient says she has  no feeling in her feet.   Podiatric Exam: Vascular: dorsalis pedis and posterior tibial pulses are palpable bilateral. Capillary return is immediate. Temperature gradient is WNL. Skin turgor WNL  Sensorium: Absent  Semmes Weinstein monofilament test. Normal tactile sensation bilaterally. Nail Exam: Pt has thick disfigured discolored nails with subungual debris noted bilateral entire nail hallux through fifth toenails Ulcer Exam: There is no evidence of ulcer or pre-ulcerative changes or infection. Orthopedic Exam: Muscle tone and strength are WNL. No limitations in general ROM. No crepitus or effusions noted. Foot type and digits show no abnormalities. Bony prominences are unremarkable. Severe rearfoot  DJD  B/L Skin: No Porokeratosis. No infection or ulcers  Diagnosis:  Onychomycosis, , Pain in right toe, pain in left toes Diabetes with neuropathy  Treatment & Plan Procedures and Treatment: Consent by patient was obtained for treatment procedures. The patient understood the discussion of treatment and procedures well. All questions were answered thoroughly reviewed. Debridement of mycotic and hypertrophic toenails, 1 through 5 bilateral and clearing of subungual debris. No ulceration, no infection noted.  Return Visit-Office Procedure: Patient instructed to return to the office for a follow up visit 3 months for continued evaluation and treatment.    Gardiner Barefoot DPM

## 2017-02-05 ENCOUNTER — Other Ambulatory Visit: Payer: Medicare Other | Admitting: Orthotics

## 2017-02-06 DIAGNOSIS — R42 Dizziness and giddiness: Secondary | ICD-10-CM | POA: Diagnosis not present

## 2017-02-06 DIAGNOSIS — M5136 Other intervertebral disc degeneration, lumbar region: Secondary | ICD-10-CM | POA: Diagnosis not present

## 2017-02-06 DIAGNOSIS — M62830 Muscle spasm of back: Secondary | ICD-10-CM | POA: Diagnosis not present

## 2017-02-06 DIAGNOSIS — Z9181 History of falling: Secondary | ICD-10-CM | POA: Diagnosis not present

## 2017-02-06 DIAGNOSIS — M542 Cervicalgia: Secondary | ICD-10-CM | POA: Diagnosis not present

## 2017-02-06 DIAGNOSIS — M5116 Intervertebral disc disorders with radiculopathy, lumbar region: Secondary | ICD-10-CM | POA: Diagnosis not present

## 2017-02-06 DIAGNOSIS — M6281 Muscle weakness (generalized): Secondary | ICD-10-CM | POA: Diagnosis not present

## 2017-02-06 DIAGNOSIS — R296 Repeated falls: Secondary | ICD-10-CM | POA: Diagnosis not present

## 2017-02-06 DIAGNOSIS — M9903 Segmental and somatic dysfunction of lumbar region: Secondary | ICD-10-CM | POA: Diagnosis not present

## 2017-02-06 DIAGNOSIS — M25561 Pain in right knee: Secondary | ICD-10-CM | POA: Diagnosis not present

## 2017-02-12 DIAGNOSIS — M6281 Muscle weakness (generalized): Secondary | ICD-10-CM | POA: Diagnosis not present

## 2017-02-12 DIAGNOSIS — M542 Cervicalgia: Secondary | ICD-10-CM | POA: Diagnosis not present

## 2017-02-12 DIAGNOSIS — M5136 Other intervertebral disc degeneration, lumbar region: Secondary | ICD-10-CM | POA: Diagnosis not present

## 2017-02-12 DIAGNOSIS — M9903 Segmental and somatic dysfunction of lumbar region: Secondary | ICD-10-CM | POA: Diagnosis not present

## 2017-02-12 DIAGNOSIS — M545 Low back pain: Secondary | ICD-10-CM | POA: Diagnosis not present

## 2017-02-12 DIAGNOSIS — Z9181 History of falling: Secondary | ICD-10-CM | POA: Diagnosis not present

## 2017-02-12 DIAGNOSIS — M25561 Pain in right knee: Secondary | ICD-10-CM | POA: Diagnosis not present

## 2017-02-12 DIAGNOSIS — M5116 Intervertebral disc disorders with radiculopathy, lumbar region: Secondary | ICD-10-CM | POA: Diagnosis not present

## 2017-02-12 DIAGNOSIS — M62830 Muscle spasm of back: Secondary | ICD-10-CM | POA: Diagnosis not present

## 2017-02-12 DIAGNOSIS — R296 Repeated falls: Secondary | ICD-10-CM | POA: Diagnosis not present

## 2017-02-12 DIAGNOSIS — R42 Dizziness and giddiness: Secondary | ICD-10-CM | POA: Diagnosis not present

## 2017-02-13 ENCOUNTER — Telehealth: Payer: Self-pay | Admitting: Internal Medicine

## 2017-02-13 MED ORDER — SITAGLIPTIN PHOSPHATE 100 MG PO TABS: 100.0000 mg | ORAL_TABLET | Freq: Every day | ORAL | 1 refills | Status: DC

## 2017-02-13 MED ORDER — TRAMADOL HCL 50 MG PO TABS: 50.0000 mg | ORAL_TABLET | Freq: Every day | ORAL | 0 refills | Status: DC | PRN

## 2017-02-13 MED ORDER — DIAZEPAM 10 MG PO TABS: 10.0000 mg | ORAL_TABLET | Freq: Every day | ORAL | 0 refills | Status: DC

## 2017-02-13 MED ORDER — ROSUVASTATIN CALCIUM 10 MG PO TABS: 10.0000 mg | ORAL_TABLET | Freq: Every day | ORAL | 1 refills | Status: DC

## 2017-02-13 NOTE — Telephone Encounter (Signed)
I do not refill controlled substances with a 90 day supply.  I will Refill for 30 days only.  OFFICE VISIT NEEDED prior to any more refills

## 2017-02-13 NOTE — Telephone Encounter (Signed)
I have refilled everything but the tramadol and the diazepam patient last OV 5/18 but I know usually diazepam you do not fill for 90 days supply, ok to fill for 30?

## 2017-02-13 NOTE — Telephone Encounter (Signed)
Patient notified and voiced understanding.

## 2017-02-13 NOTE — Telephone Encounter (Signed)
Pt called and is requesting new rx's fortraMADol (ULTRAM) 50 MG tablet, rosuvastatin (CRESTOR) 10 MG tablet, diazepam (VALIUM) 10 MG tablet, and sitaGLIPtin (JANUVIA) 100 MG tablet. Pt does need a 3 month supply. PT states that she is just about out of medication. Please advise, thank you!  Call pt @ [phone]  Pharmacy - [facility] - [place], [place]

## 2017-02-15 DIAGNOSIS — M9903 Segmental and somatic dysfunction of lumbar region: Secondary | ICD-10-CM | POA: Diagnosis not present

## 2017-02-15 DIAGNOSIS — M5116 Intervertebral disc disorders with radiculopathy, lumbar region: Secondary | ICD-10-CM | POA: Diagnosis not present

## 2017-02-15 DIAGNOSIS — M5136 Other intervertebral disc degeneration, lumbar region: Secondary | ICD-10-CM | POA: Diagnosis not present

## 2017-02-15 DIAGNOSIS — M62830 Muscle spasm of back: Secondary | ICD-10-CM | POA: Diagnosis not present

## 2017-02-17 ENCOUNTER — Encounter: Payer: Self-pay | Admitting: Emergency Medicine

## 2017-02-17 ENCOUNTER — Emergency Department
Admission: EM | Admit: 2017-02-17 | Discharge: 2017-02-17 | Disposition: A | Discharge status: Home / Self Care | Payer: Medicare Other | Attending: Emergency Medicine | Admitting: Emergency Medicine

## 2017-02-17 ENCOUNTER — Emergency Department: Payer: Medicare Other

## 2017-02-17 DIAGNOSIS — Y929 Unspecified place or not applicable: Secondary | ICD-10-CM | POA: Diagnosis not present

## 2017-02-17 DIAGNOSIS — W010XXA Fall on same level from slipping, tripping and stumbling without subsequent striking against object, initial encounter: Secondary | ICD-10-CM | POA: Diagnosis not present

## 2017-02-17 DIAGNOSIS — S0031XA Abrasion of nose, initial encounter: Secondary | ICD-10-CM | POA: Diagnosis not present

## 2017-02-17 DIAGNOSIS — T148XXA Other injury of unspecified body region, initial encounter: Secondary | ICD-10-CM

## 2017-02-17 DIAGNOSIS — E119 Type 2 diabetes mellitus without complications: Secondary | ICD-10-CM | POA: Insufficient documentation

## 2017-02-17 DIAGNOSIS — S0993XA Unspecified injury of face, initial encounter: Secondary | ICD-10-CM | POA: Diagnosis not present

## 2017-02-17 DIAGNOSIS — Y939 Activity, unspecified: Secondary | ICD-10-CM | POA: Diagnosis not present

## 2017-02-17 DIAGNOSIS — W19XXXA Unspecified fall, initial encounter: Secondary | ICD-10-CM

## 2017-02-17 DIAGNOSIS — I10 Essential (primary) hypertension: Secondary | ICD-10-CM | POA: Insufficient documentation

## 2017-02-17 DIAGNOSIS — Z79899 Other long term (current) drug therapy: Secondary | ICD-10-CM | POA: Diagnosis not present

## 2017-02-17 DIAGNOSIS — Z7982 Long term (current) use of aspirin: Secondary | ICD-10-CM | POA: Insufficient documentation

## 2017-02-17 DIAGNOSIS — Y999 Unspecified external cause status: Secondary | ICD-10-CM | POA: Insufficient documentation

## 2017-02-17 DIAGNOSIS — R51 Headache: Secondary | ICD-10-CM | POA: Insufficient documentation

## 2017-02-17 DIAGNOSIS — S0003XA Contusion of scalp, initial encounter: Secondary | ICD-10-CM | POA: Diagnosis not present

## 2017-02-17 MED ORDER — BUTALBITAL-APAP-CAFFEINE 50-325-40 MG PO TABS: 1.0000 | ORAL_TABLET | Freq: Four times a day (QID) | ORAL | 0 refills | Status: DC | PRN

## 2017-02-17 MED ORDER — BUTALBITAL-APAP-CAFFEINE 50-325-40 MG PO TABS
1.0000 | ORAL_TABLET | Freq: Once | ORAL | Status: AC
  Administered 2017-02-17: 1 via ORAL
  Filled 2017-02-17: qty 1

## 2017-02-17 MED ORDER — BACITRACIN ZINC 500 UNIT/GM EX OINT
TOPICAL_OINTMENT | CUTANEOUS | Status: AC
  Filled 2017-02-17: qty 0.9

## 2017-02-17 NOTE — ED Provider Notes (Signed)
[facility] Emergency Department Provider Note   ____________________________________________   I have reviewed the triage vital signs and the nursing notes.   HISTORY  Chief Complaint Fall   History limited by: Not Limited   HPI Denise Sharp is a 71 y.o. female who presents to the emergency department today because of concerns for a fall and injury. The patient states she tripped on a carpet. She fell face first. She stated it happened very quickly. She does not think she lost consciousness. She did hit the front of her face and nose. Lining of some pain out of her nose in the front part of her head. Initially the was worse however that has eased off. She has a little bit of soreness to her left forearm and hand. She denies any chest pain or shortness breath. No recent fevers or illness. Because of concerns for    Past Medical History:  Diagnosis Date  . Abnormality of gait 03/25/2013  . Adrenal mass ([place])   . Adrenal mass, left ([place])   . Anemia    iron deficient post  2 unit txfsn 2009, normal endo/colonoscopy by [facility]  . Apnea   . Arthritis   . Cervical spinal stenosis 1994   due to trauma to back ([employer] accident), has intermittent paralysis and parasthesias  . Cervicogenic headache 03/23/2014  . Depression   . Diabetes mellitus   . Dizziness    chronic dizziness  . Esophageal stenosis March 2011   with transietn outlet obstruction by food, cleared by EGD   . Headache(784.0)   . Hypertension   . IBS (irritable bowel syndrome)   . LBBB (left bundle branch block)   . Left bundle branch block (LBBB)    Intermittently present  . Obesity   . Obstructive sleep apnea    using CPAP  . 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 Restless leg syndrome   . Syncope and collapse 03/12/2014    Patient Active Problem List   Diagnosis Date Noted  . 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 Atypical chest pain 10/16/2016  .  Dizziness 10/16/2016  . Fever blister 06/10/2016  . Edema 12/13/2015  . Pain and swelling of right lower leg 12/02/2015  . Hypersomnia, persistent 06/18/2015  . Candida rash of groin 10/31/2014  . Mechanical breakdown of implanted electronic neurostimulator of peripheral nerve ([place]) 10/16/2014  . Acute cystitis without hematuria 10/13/2014  . 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 Frequent falls 06/23/2014  . Chronic cough 05/05/2014  . Adenoma of left adrenal gland 03/24/2014  . Cervicogenic headache 03/23/2014  . Syncope and collapse 03/12/2014  . Vitamin D deficiency 01/06/2014  . Pannus, abdominal 12/23/2013  . Right lower quadrant pain 12/03/2013  . Rectal bleeding 12/03/2013  . Weight gain 12/03/2013  . Obesity, unspecified 08/25/2013  . Fatigue due to depression 08/04/2013  . Diaphragmatic hernia 04/06/2013  . Diabetic polyneuropathy ([place]) 03/25/2013  . Abnormality of gait 03/25/2013  . Multiple pulmonary nodules 03/20/2013  . Shortness of breath 03/20/2013  . Anemia 08/06/2012  . Iron deficiency anemia 08/06/2012  . Functional disorder of bladder 07/15/2012  . Incomplete bladder emptying 07/15/2012  . 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 Urge incontinence 07/15/2012  . Increased frequency of urination 07/15/2012  . OSA on CPAP 03/02/2012  . Insomnia secondary to anxiety 02/29/2012  . Sciatica 09/05/2011  . Cervical stenosis of spinal canal 06/14/2011  . Restless legs syndrome 03/13/2011  . Degenerative disk disease  03/13/2011  . Hypertension 03/12/2011  . 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 Hearing loss, right 03/12/2011    Past Surgical History:  Procedure Laterality Date  . ABDOMINAL HYSTERECTOMY    . APPENDECTOMY    . DIAPHRAGMATIC HERNIA REPAIR  2015  . [place]  . GALLBLADDER SURGERY  resection  . GASTRIC BYPASS    . GASTRIC BYPASS  2000, 2005   Dr. Debroah Loop  . [facility] IMPLANT  PLACEMENT  April 2013   [place]  . JOINT REPLACEMENT  2007   bilateral knee. [name],  [name]  . ROTATOR CUFF REPAIR     right  . [place]  . TOTAL ABDOMINAL HYSTERECTOMY W/ BILATERAL SALPINGOOPHORECTOMY  1974  . UMBILICAL HERNIA REPAIR  Aug 11, 2015    Prior to Admission medications   Medication Sig Start Date End Date Taking? Authorizing Provider  albuterol (PROVENTIL HFA;VENTOLIN HFA) 108 (90 Base) MCG/ACT inhaler Inhale 2 puffs into the lungs every 8 (eight) hours as needed for wheezing or shortness of breath. 05/29/16   Burnard Hawthorne, FNP  amLODipine (NORVASC) 5 MG tablet Take 1 tablet (5 mg total) by mouth daily. 05/29/16   Burnard Hawthorne, FNP  aspirin EC 81 MG tablet Take 81 mg by mouth daily.    [provider]  benzonatate (TESSALON) 200 MG capsule Take 200 mg by mouth 3 (three) times daily as needed for cough.    [provider]  carvedilol (COREG) 3.125 MG tablet TAKE 1 TABLET (3.125 MG TOTAL) BY MOUTH 2 (TWO) TIMES DAILY. 11/12/16 02/10/17  End, Harrell Gave, MD  Cholecalciferol (VITAMIN D PO) Take 10,000 Units by mouth daily.    [provider]  cholestyramine light (PREVALITE) 4 GM/DOSE powder Take 4 g by mouth 2 (two) times daily. 05/14/16   [provider]  diazepam (VALIUM) 10 MG tablet Take 1 tablet (10 mg total) by mouth at bedtime. 1 TABLET DAILY AS NEEDED FOR ANXIETY OR INSOMNIA 02/13/17   [name], MD  diphenhydrAMINE (BENADRYL) 25 MG tablet Take 25 mg by mouth every 6 (six) hours as needed.    [provider]  DULoxetine (CYMBALTA) 30 MG capsule Take 1 capsule (30 mg total) by mouth 2 (two) times daily. 01/23/17   Kathrynn Ducking, MD  hydrocodone-acetaminophen (ZYDONE) 5-400 MG tablet Take 1 tablet by mouth every 6 (six) hours as needed for pain.    [provider]  hyoscyamine (LEVSIN SL) 0.125 MG SL tablet Place 0.125 mg under the tongue 2 (two) times daily. 05/14/16   [provider]  levETIRAcetam (KEPPRA) 500 MG tablet Take 1 tablet (500 mg total) by mouth 2 (two) times daily. 03/22/16   Kathrynn Ducking, MD  LUNESTA 2 MG TABS tablet Take 1 tablet (2 mg total) by mouth at bedtime as needed for sleep. Take immediately before bedtime 11/19/16   [name], MD  mometasone (ELOCON) 0.1 % lotion APPLY 4 DROPS TO ITCHY EAR TWICE A DAY FOR 10 DAYS, MAY REPEAT AFTER 3 WEEKS IF ITCHES AGAIN 01/28/17   [provider]  ondansetron (ZOFRAN) 4 MG tablet Take 1 tablet (4 mg total) by mouth every 8 (eight) hours as needed for nausea or vomiting. 04/03/16 04/03/17  Eula Listen, MD  pregabalin (LYRICA) 100 MG capsule Take 1 capsule (100 mg total) by mouth 2 (two) times daily. 03/22/16   Kathrynn Ducking, MD  rOPINIRole (REQUIP) 1  MG tablet Take 1 tablet (1 mg total) by mouth at bedtime. 08/15/16   [name], MD  rosuvastatin (CRESTOR) 10 MG tablet Take 1 tablet (10 mg total) by mouth daily. 02/13/17   [name], MD  sitaGLIPtin (JANUVIA) 100 MG tablet Take 1 tablet (100 mg total) by mouth daily. 02/13/17   [name], MD  tizanidine (ZANAFLEX) 2 MG capsule Take 2 mg by mouth 3 (three) times daily.    [provider]  traMADol (ULTRAM) 50 MG tablet Take 1 tablet (50 mg total) by mouth daily as needed. 02/13/17   [name], MD    Allergies Diovan [valsartan]; Erythromycin; Flagyl [metronidazole]; Glucophage [metformin hcl]; Xanax [alprazolam]; Amoxicillin; Demeclocycline; Erythromycin base; Metformin; Sulfa antibiotics; and Tetracyclines & related  Family History  Problem Relation Age of Onset  . Heart disease Father   . Hypertension Father   . Prostate cancer Father   . Stroke Father   . Osteoporosis Father   . Stroke Mother   . Depression Mother   . Headache Mother   . Heart disease Mother   . Thyroid disease Mother   . Hypertension Mother   . Diabetes Daughter   . Heart disease Daughter   . Hypertension Daughter   .  Hypertension Son     Social History Social History  Substance Use Topics  . Smoking status: Never Smoker  . Smokeless tobacco: Never Used  . Alcohol use No    Review of Systems Constitutional: No fever/chills Eyes: No visual changes. ENT: No sore throat. Cardiovascular: Denies chest pain. Respiratory: Denies shortness of breath. Gastrointestinal: No abdominal pain.  No nausea, no vomiting.  No diarrhea.   Genitourinary: Negative for dysuria. Musculoskeletal: Negative for back pain. Skin: Positive for abrasion to bridge of nose. Neurological: Positive for headache.  ____________________________________________   PHYSICAL EXAM:  VITAL SIGNS: ED Triage Vitals  Enc Vitals Group     BP 02/17/17 1457 128/60     Pulse Rate 02/17/17 1457 81     Resp 02/17/17 1457 16     Temp 02/17/17 1457 98.4 F (36.9 C)     Temp Source 02/17/17 1457 Oral     SpO2 --      Weight --      Height --      Head Circumference --      Peak Flow --      Pain Score 02/17/17 1454 9   Constitutional: Alert and oriented. Well appearing and in no distress. Eyes: Conjunctivae are normal.  ENT   Head: Normocephalic. Abrasion to bridge of nose.   Nose: No congestion/rhinnorhea.   Mouth/Throat: Mucous membranes are moist.   Neck: No stridor. No midline tenderness. Hematological/Lymphatic/Immunilogical: No cervical lymphadenopathy. Cardiovascular: Normal rate, regular rhythm.  No murmurs, rubs, or gallops.  Respiratory: Normal respiratory effort without tachypnea nor retractions. Breath sounds are clear and equal bilaterally. No wheezes/rales/rhonchi. Gastrointestinal: Soft and non tender. No rebound. No guarding.  Genitourinary: Deferred Musculoskeletal: Normal range of motion in all extremities. No lower extremity edema. Neurologic:  Normal speech and language. No gross focal neurologic deficits are appreciated.  Skin:  Skin is warm, dry and intact. No rash noted. Psychiatric: Mood and  affect are normal. Speech and behavior are normal. Patient exhibits appropriate insight and judgment.  ____________________________________________    LABS (pertinent positives/negatives)  Labs Reviewed - No data to display   ____________________________________________   EKG  None  ____________________________________________    RADIOLOGY  CT head/max face  IMPRESSION: 1. Left forehead scalp contusion. Soft tissue swelling at the bridge of the nose. However, no nasal bone, face or skull fracture identified. 2. No acute intracranial abnormality and stable non contrast CT appearance of the brain.   ____________________________________________   PROCEDURES  Procedures  ____________________________________________   INITIAL IMPRESSION / ASSESSMENT AND PLAN / ED COURSE  Pertinent labs & imaging results that were available during my care of the patient were reviewed by me and considered in my medical decision making (see chart for details).  Patient presented to the emergency department today after a fall. Imaging negative. Patient headache did improve with medication. Will prescribe a short course.  ____________________________________________   FINAL CLINICAL IMPRESSION(S) / ED DIAGNOSES  Final diagnoses:  Fall, initial encounter  Abrasion     Note: This dictation was prepared with Dragon dictation. Any transcriptional errors that result from this process are unintentional     Nance Pear, MD 02/18/17 0009

## 2017-02-17 NOTE — Discharge Instructions (Signed)
Please seek medical attention for any high fevers, chest pain, shortness of breath, change in behavior, persistent vomiting, bloody stool or any other new or concerning symptoms.  

## 2017-02-17 NOTE — ED Notes (Signed)
FIRST NURSE NOTE:  Pt states she tripped on the carpet at a [employer] and fell onto her face. Pt has small laceration to bridge of her nose and c/o forehead pain. Pt states she is not on any blood thinners.  Pt here with family. Pt placed in w/c on arrival.

## 2017-02-17 NOTE — ED Triage Notes (Signed)
Pt states that she was out to eat and tripped over the carpet and fell landing on her face. Pt states that she hit her nose and face and thinks she may have also hurt both of her arms. Pt has small abrasion on her nose. No other injuries noted.

## 2017-02-19 ENCOUNTER — Other Ambulatory Visit: Payer: Self-pay | Admitting: *Deleted

## 2017-02-19 MED ORDER — DULOXETINE HCL 30 MG PO CPEP: 30.0000 mg | ORAL_CAPSULE | Freq: Two times a day (BID) | ORAL | 1 refills | Status: DC

## 2017-03-04 ENCOUNTER — Ambulatory Visit (INDEPENDENT_AMBULATORY_CARE_PROVIDER_SITE_OTHER): Payer: Medicare Other | Admitting: Internal Medicine

## 2017-03-04 ENCOUNTER — Ambulatory Visit (INDEPENDENT_AMBULATORY_CARE_PROVIDER_SITE_OTHER): Payer: Medicare Other

## 2017-03-04 ENCOUNTER — Encounter: Payer: Self-pay | Admitting: Internal Medicine

## 2017-03-04 VITALS — BP 148/84 | HR 91 | Temp 98.4°F | Resp 16 | Ht 64.0 in | Wt 242.6 lb

## 2017-03-04 DIAGNOSIS — M79642 Pain in left hand: Secondary | ICD-10-CM

## 2017-03-04 DIAGNOSIS — M79602 Pain in left arm: Secondary | ICD-10-CM

## 2017-03-04 DIAGNOSIS — M25511 Pain in right shoulder: Secondary | ICD-10-CM

## 2017-03-04 DIAGNOSIS — R296 Repeated falls: Secondary | ICD-10-CM

## 2017-03-04 DIAGNOSIS — R42 Dizziness and giddiness: Secondary | ICD-10-CM | POA: Diagnosis not present

## 2017-03-04 DIAGNOSIS — F5105 Insomnia due to other mental disorder: Secondary | ICD-10-CM

## 2017-03-04 DIAGNOSIS — R635 Abnormal weight gain: Secondary | ICD-10-CM

## 2017-03-04 DIAGNOSIS — G8911 Acute pain due to trauma: Secondary | ICD-10-CM

## 2017-03-04 DIAGNOSIS — S6992XA Unspecified injury of left wrist, hand and finger(s), initial encounter: Secondary | ICD-10-CM | POA: Diagnosis not present

## 2017-03-04 DIAGNOSIS — I10 Essential (primary) hypertension: Secondary | ICD-10-CM

## 2017-03-04 DIAGNOSIS — M19011 Primary osteoarthritis, right shoulder: Secondary | ICD-10-CM | POA: Diagnosis not present

## 2017-03-04 DIAGNOSIS — F419 Anxiety disorder, unspecified: Secondary | ICD-10-CM

## 2017-03-04 DIAGNOSIS — E1142 Type 2 diabetes mellitus with diabetic polyneuropathy: Secondary | ICD-10-CM | POA: Diagnosis not present

## 2017-03-04 DIAGNOSIS — M25532 Pain in left wrist: Secondary | ICD-10-CM | POA: Diagnosis not present

## 2017-03-04 LAB — COMPREHENSIVE METABOLIC PANEL
ALT: 13 U/L (ref 0–35)
AST: 15 U/L (ref 0–37)
Albumin: 3.6 g/dL (ref 3.5–5.2)
Alkaline Phosphatase: 137 U/L — ABNORMAL HIGH (ref 39–117)
BUN: 12 mg/dL (ref 6–23)
CO2: 22 mEq/L (ref 19–32)
Calcium: 8.8 mg/dL (ref 8.4–10.5)
Chloride: 105 mEq/L (ref 96–112)
Creatinine, Ser: 0.84 mg/dL (ref 0.40–1.20)
GFR: 70.95 mL/min (ref >60.00)
Glucose, Bld: 224 mg/dL — ABNORMAL HIGH (ref 70–99)
Potassium: 3.7 mEq/L (ref 3.5–5.1)
Sodium: 139 mEq/L (ref 135–145)
Total Bilirubin: 0.3 mg/dL (ref 0.2–1.2)
Total Protein: 7.6 g/dL (ref 6.0–8.3)

## 2017-03-04 LAB — HEMOGLOBIN A1C: Hgb A1c MFr Bld: 7.4 % — ABNORMAL HIGH (ref 4.6–6.5)

## 2017-03-04 NOTE — Patient Instructions (Addendum)
Plain x rays of wrist, hand and shoulder to be done today  I cannot increase your sedating medications until you change your bedtime habits:   NO TV ONE HOUR BEFORE BEDTIME.  NO I PHONE, ipads or  FACE BOOK 2 HOURS BEFORE BEDTIME  IF THE INSOMNIA IS STILL AN ISSUE AFTER 2 WEEKS LET ME KNOW

## 2017-03-04 NOTE — Progress Notes (Signed)
Subjective:  Patient ID: Denise Sharp, female    DOB: 1945/08/06  Age: 71 y.o. MRN: [id number]  CC: The primary encounter diagnosis was Left hand pain. Diagnoses of Arm pain, inferior, left, Acute pain of right shoulder, DM type 2 with diabetic peripheral neuropathy ([place]), Weight gain, Insomnia secondary to anxiety, Essential hypertension, Acute shoulder pain due to trauma, right, Frequent falls, and Dizziness were also pertinent to this visit.  HPI Denise Sharp presents for 3 month follow up on diabetes.  Patient has  Several pain  complaints today.   Sustained a fall at a local restaurant on August 5 when she tripped on their carpet.and had blunt trauma to face and arms.   Head CT  And maxillofacial CT were done .  No fractures or SDH .   S/T swelling over the bridge of the nose noted, and left forehead scalp contusion noted.. Cervical  spine , right shoulder and left hand were not individually imaged.   Still reporting dull headache,  Using fioricet prescribed by ER physician,  Has 2 left.  States she was told she had a concussion but has not limited activities.  Still watching TV in bed and not sleeping.  pain and swelling involving the left lateral hand with pain into distal forearm,  and the right shoulder pain is worse than her chronic usual pain .  States that she could not raise right arm at all after the fall, now  feels that both arms have become weak..  Reports persistent numbness and tingling in the left hand but ,not proximal to the wrist.     Getting only 3 interrupted hours of sleep at night despite taking 10 mg  Valium, ropinirole,  and 2 mg lunesta.  Watches TV in bed.  Stays in bed from 10:30  Pm  to 9:30. Am   Wearing CPAP the full night and whenever she naps .  Up several times  per night to void.  Has chronic diarrhea that also occurs at night.  Has fecal urgency and incontinence. seeing GI at [facility] for management . Taking Colestid and Levsin .    Frustrated at  continued weight gain  despite "not eating."  Not exercising because of  of bowel incontinence .  Dizzy all the time , says she was told by neurologist  not to walk.  Has a recumbent stationery bicycle but has not tried to use it.   DM : Patient is not following a low glycemic index diet.  taking all prescribed medications regularly without side effects.  Fasting sugars have been under less than 140 most of the time and post prandials have been under 160 except on rare occasions. .  Patient has had an eye exam in the last 12 months and checks feet regularly for signs of infection.  Patient does not walk barefoot outside,  And denies any numbness tingling or burning in feet. Patient is up to date on all recommended vaccinations  Outpatient Medications Prior to Visit  Medication Sig Dispense Refill  . albuterol (PROVENTIL HFA;VENTOLIN HFA) 108 (90 Base) MCG/ACT inhaler Inhale 2 puffs into the lungs every 8 (eight) hours as needed for wheezing or shortness of breath. 1 Inhaler 0  . amLODipine (NORVASC) 5 MG tablet Take 1 tablet (5 mg total) by mouth daily. 90 tablet 0  . aspirin EC 81 MG tablet Take 81 mg by mouth daily.    . benzonatate (TESSALON) 200 MG capsule Take 200 mg by mouth  3 (three) times daily as needed for cough.    . butalbital-acetaminophen-caffeine (FIORICET, ESGIC) 50-325-40 MG tablet Take 1 tablet by mouth every 6 (six) hours as needed for headache. 10 tablet 0  . Cholecalciferol (VITAMIN D PO) Take 10,000 Units by mouth daily.    . cholestyramine light (PREVALITE) 4 GM/DOSE powder Take 4 g by mouth 2 (two) times daily.    . diazepam (VALIUM) 10 MG tablet Take 1 tablet (10 mg total) by mouth at bedtime. 1 TABLET DAILY AS NEEDED FOR ANXIETY OR INSOMNIA 30 tablet 0  . diphenhydrAMINE (BENADRYL) 25 MG tablet Take 25 mg by mouth every 6 (six) hours as needed.    . DULoxetine (CYMBALTA) 30 MG capsule Take 1 capsule (30 mg total) by mouth 2 (two) times daily. 180 capsule 1  . hyoscyamine  (LEVSIN SL) 0.125 MG SL tablet Place 0.125 mg under the tongue 2 (two) times daily.    [name] levETIRAcetam (KEPPRA) 500 MG tablet Take 1 tablet (500 mg total) by mouth 2 (two) times daily. 60 tablet 3  . LUNESTA 2 MG TABS tablet Take 1 tablet (2 mg total) by mouth at bedtime as needed for sleep. Take immediately before bedtime 90 tablet 1  . mometasone (ELOCON) 0.1 % lotion APPLY 4 DROPS TO ITCHY EAR TWICE A DAY FOR 10 DAYS, MAY REPEAT AFTER 3 WEEKS IF ITCHES AGAIN  5  . ondansetron (ZOFRAN) 4 MG tablet Take 1 tablet (4 mg total) by mouth every 8 (eight) hours as needed for nausea or vomiting. 15 tablet 0  . pregabalin (LYRICA) 100 MG capsule Take 1 capsule (100 mg total) by mouth 2 (two) times daily.    [name] rOPINIRole (REQUIP) 1 MG tablet Take 1 tablet (1 mg total) by mouth at bedtime. 90 tablet 5  . rosuvastatin (CRESTOR) 10 MG tablet Take 1 tablet (10 mg total) by mouth daily. 90 tablet 1  . sitaGLIPtin (JANUVIA) 100 MG tablet Take 1 tablet (100 mg total) by mouth daily. 90 tablet 1  . tizanidine (ZANAFLEX) 2 MG capsule Take 2 mg by mouth 3 (three) times daily.    . traMADol (ULTRAM) 50 MG tablet Take 1 tablet (50 mg total) by mouth daily as needed. 30 tablet 0  . carvedilol (COREG) 3.125 MG tablet TAKE 1 TABLET (3.125 MG TOTAL) BY MOUTH 2 (TWO) TIMES DAILY. 60 tablet 2  . hydrocodone-acetaminophen (ZYDONE) 5-400 MG tablet Take 1 tablet by mouth every 6 (six) hours as needed for pain.     No facility-administered medications prior to visit.     Review of Systems;  Patient denies vision changes, loss of memory,  fevers, malaise, unintentional weight loss, skin rash, eye pain, sinus congestion and sinus pain, sore throat, dysphagia,  hemoptysis , cough, dyspnea, wheezing, chest pain, palpitations, orthopnea, edema, abdominal pain, nausea, melena, , constipation, flank pain, dysuria, hematuria, urinary  Frequency, numbness, seizures,   Loss of consciousness,  Tremor,depression, anxiety, and suicidal  ideation.      Objective:  BP (!) 148/84 (BP Location: Left Arm, Patient Position: Sitting, Cuff Size: Normal)   Pulse 91   Temp 98.4 F (36.9 C) (Oral)   Resp 16   Ht 5\' 4"  (1.626 m)   Wt 242 lb 9.6 oz (110 kg)   SpO2 97%   BMI 41.64 kg/m   BP Readings from Last 3 Encounters:  03/04/17 (!) 148/84  02/17/17 134/60  01/31/17 126/83    Wt Readings from Last 3 Encounters:  03/04/17 242 lb  9.6 oz (110 kg)  01/31/17 235 lb (106.6 kg)  01/23/17 243 lb (110.2 kg)    General appearance: alert, cooperative and appears stated age Ears: normal TM's and external ear canals both ears Throat: lips, mucosa, and tongue normal; teeth and gums normal Neck: no adenopathy, no carotid bruit, supple, symmetrical, trachea midline and thyroid not enlarged, symmetric, no tenderness/mass/nodules Back: symmetric, no curvature. ROM normal. No CVA tenderness. Lungs: clear to auscultation bilaterally Heart: regular rate and rhythm, S1, S2 normal, no murmur, click, rub or gallop Abdomen: soft, non-tender; bowel sounds normal; no masses,  no organomegaly Pulses: 2+ and symmetric Skin: Skin color, texture, turgor normal. No rashes or lesions Lymph nodes: Cervical, supraclavicular, and axillary nodes normal.  Lab Results  Component Value Date   HGBA1C 7.4 (H) 03/04/2017   HGBA1C 7.4 (H) 11/19/2016   HGBA1C 7.1 (H) 06/29/2016    Lab Results  Component Value Date   CREATININE 0.84 03/04/2017   CREATININE 1.14 11/19/2016   CREATININE 1.10 (H) 08/03/2016    Lab Results  Component Value Date   WBC 9.9 01/31/2017   HGB 13.3 01/31/2017   HCT 39.9 01/31/2017   PLT 298 01/31/2017   GLUCOSE 224 (H) 03/04/2017   CHOL 196 06/29/2016   TRIG 179.0 (H) 06/29/2016   HDL 55.80 06/29/2016   LDLDIRECT 111.0 06/16/2015   LDLCALC 105 (H) 06/29/2016   ALT 13 03/04/2017   AST 15 03/04/2017   NA 139 03/04/2017   K 3.7 03/04/2017   CL 105 03/04/2017   CREATININE 0.84 03/04/2017   BUN 12 03/04/2017    CO2 22 03/04/2017   TSH 4.400 06/16/2015   HGBA1C 7.4 (H) 03/04/2017   MICROALBUR 9.5 (H) 06/29/2016    Ct Head Wo Contrast  Result Date: 02/17/2017 CLINICAL DATA:  71 year old female status post trip on carpet and fall from standing landing on face. Face and nose pain. EXAM: CT HEAD WITHOUT CONTRAST CT MAXILLOFACIAL WITHOUT CONTRAST TECHNIQUE: Multidetector CT imaging of the head and maxillofacial structures were performed using the standard protocol without intravenous contrast. Multiplanar CT image reconstructions of the maxillofacial structures were also generated. COMPARISON:  Cervical spine CT 09/23/2015. Head CT 02/25/2014. Head CTA 01/10/2012. FINDINGS: CT HEAD FINDINGS Brain: Cerebral volume is stable and normal for age. Scattered and patchy cerebral white matter hypodensity appears stable. No midline shift, ventriculomegaly, mass effect, evidence of mass lesion, intracranial hemorrhage or evidence of cortically based acute infarction. Incidental dural calcifications. Vascular: Calcified atherosclerosis at the skull base. Skull: No skull fracture identified. Hyperostosis frontalis, normal variant. Other: No scalp hematoma. CT MAXILLOFACIAL FINDINGS Osseous: Nasal bones appear stable since 2013. There is soft tissue swelling over the bridge of the nose. Intact mandible. Absent maxillary dentition. No maxilla or zygoma fracture. Central skullbase appears intact. Visible cervical spine appears stable. Orbits: Normal orbits soft tissues. Bilateral orbital walls are intact. Sinuses: Visualized paranasal sinuses and mastoids are stable and well pneumatized. Soft tissues: Negative noncontrast visualized larynx, pharynx, parapharyngeal spaces, retropharyngeal space, sublingual space, submandibular glands and parotid glands. Mild soft tissues stand stress that mild soft tissue stranding and asymmetry at the left forehead overlying the left frontal sinus which appears intact (series 6, image 14). No  subcutaneous gas. IMPRESSION: 1. Left forehead scalp contusion. Soft tissue swelling at the bridge of the nose. However, no nasal bone, face or skull fracture identified. 2. No acute intracranial abnormality and stable non contrast CT appearance of the brain. Electronically Signed   By: Herminio Heads.D.  On: 02/17/2017 16:10   Ct Maxillofacial Wo Contrast  Result Date: 02/17/2017 CLINICAL DATA:  71 year old female status post trip on carpet and fall from standing landing on face. Face and nose pain. EXAM: CT HEAD WITHOUT CONTRAST CT MAXILLOFACIAL WITHOUT CONTRAST TECHNIQUE: Multidetector CT imaging of the head and maxillofacial structures were performed using the standard protocol without intravenous contrast. Multiplanar CT image reconstructions of the maxillofacial structures were also generated. COMPARISON:  Cervical spine CT 09/23/2015. Head CT 02/25/2014. Head CTA 01/10/2012. FINDINGS: CT HEAD FINDINGS Brain: Cerebral volume is stable and normal for age. Scattered and patchy cerebral white matter hypodensity appears stable. No midline shift, ventriculomegaly, mass effect, evidence of mass lesion, intracranial hemorrhage or evidence of cortically based acute infarction. Incidental dural calcifications. Vascular: Calcified atherosclerosis at the skull base. Skull: No skull fracture identified. Hyperostosis frontalis, normal variant. Other: No scalp hematoma. CT MAXILLOFACIAL FINDINGS Osseous: Nasal bones appear stable since 2013. There is soft tissue swelling over the bridge of the nose. Intact mandible. Absent maxillary dentition. No maxilla or zygoma fracture. Central skullbase appears intact. Visible cervical spine appears stable. Orbits: Normal orbits soft tissues. Bilateral orbital walls are intact. Sinuses: Visualized paranasal sinuses and mastoids are stable and well pneumatized. Soft tissues: Negative noncontrast visualized larynx, pharynx, parapharyngeal spaces, retropharyngeal space, sublingual space,  submandibular glands and parotid glands. Mild soft tissues stand stress that mild soft tissue stranding and asymmetry at the left forehead overlying the left frontal sinus which appears intact (series 6, image 14). No subcutaneous gas. IMPRESSION: 1. Left forehead scalp contusion. Soft tissue swelling at the bridge of the nose. However, no nasal bone, face or skull fracture identified. 2. No acute intracranial abnormality and stable non contrast CT appearance of the brain. Electronically Signed   By: Genevie Ann M.D.   On: 02/17/2017 16:10    Assessment & Plan:   Problem List Items Addressed This Visit    Acute shoulder pain due to trauma, right    [place] films obtained today are negative for fracture and acute changes but not spurring and degenerative changes which are chronic . Recommend seeing Orthopedics for consideration of MRI/joint injection/PT.      Dizziness    Chronic , without true vertigo .  Workup negative for ischemia and arrhythmia. Advised to use a walker but prefers to use a cane,  Continues to fall.      DM type 2 with diabetic peripheral neuropathy ([place])    Continues to show progressive  Loss of  control  Due to dietary indiscretions .  Modification of medications is challenging due to her lack of appetite, chronic diarrhea, urinary frequency and decreased mobility.  Will recommend Victoza given her morbid obesity .  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 Jacqualyn Posey with periodic checkups and use of therapeutic shoes.  She is requesting new shoes /annually   Lab Results  Component Value Date   HGBA1C 7.4 (H) 03/04/2017   Lab Results  Component Value Date   MICROALBUR 9.5 (H) 06/29/2016         Relevant Orders   Hemoglobin A1c (Completed)   Comprehensive metabolic panel (Completed)   Frequent falls    Most recently August 5 while eating at a [employer].  No evidence of  shoulder, wrist, or hand fractures by x rays done toda.  Head CT and maxillofacial CT were done in ER after the fall.  Hypertension    Elevated today due to pain. No changes given history of recurrent hypotension       Insomnia secondary to anxiety    She is requesting additional sedating medications, which I have refused.  Advised to limit fioricet to daytime since it has caffeine in it, and admonished to stop watching TV in bed. Also advised to suspend TV watching given her "concussion."      Weight gain    Etiology likely an underestimation of her food intake vs very low metabolic rate secondary to extremely sedentary lifestyle attributed to chronic diarrhea and chronic dizziness.  She has a history of remote  gastric bypass, but has regained all of the weight lost and now has OHS. Recommended use of stationery bike for exercise  ,         Other Visit Diagnoses    Left hand pain    -  Primary   Relevant Orders   DG Hand Complete Left (Completed)   Arm pain, inferior, left       Relevant Orders   DG Wrist Complete Left (Completed)   Acute pain of right shoulder       Relevant Orders   DG Shoulder Right (Completed)    A total of 40 minutes was spent with patient more than half of which was spent in counseling patient on the above mentioned issues , reviewing and explaining recent labs and imaging studies done, and coordination of care.   I am having Denise Sharp maintain her pregabalin, levETIRAcetam, ondansetron, hydrocodone-acetaminophen, cholestyramine light, hyoscyamine, albuterol, amLODipine, rOPINIRole, aspirin EC, tizanidine, diphenhydrAMINE, carvedilol, LUNESTA, Cholecalciferol (VITAMIN D PO), benzonatate, mometasone, sitaGLIPtin, rosuvastatin, diazepam, traMADol, butalbital-acetaminophen-caffeine, and DULoxetine.  No orders of the defined types were placed in this encounter.   There are no discontinued medications.  Follow-up: Return in about 3 months (around  06/04/2017).   [name], MD

## 2017-03-05 ENCOUNTER — Encounter: Payer: Self-pay | Admitting: Internal Medicine

## 2017-03-05 ENCOUNTER — Other Ambulatory Visit: Payer: Self-pay

## 2017-03-05 DIAGNOSIS — G8911 Acute pain due to trauma: Secondary | ICD-10-CM | POA: Insufficient documentation

## 2017-03-05 DIAGNOSIS — M25511 Pain in right shoulder: Secondary | ICD-10-CM | POA: Insufficient documentation

## 2017-03-05 MED ORDER — BUTALBITAL-APAP-CAFFEINE 50-325-40 MG PO TABS: 1.0000 | ORAL_TABLET | Freq: Four times a day (QID) | ORAL | 0 refills | Status: DC | PRN

## 2017-03-05 NOTE — Assessment & Plan Note (Signed)
Continues to show progressive  Loss of  control  Due to dietary indiscretions .  Modification of medications is challenging due to her lack of appetite, chronic diarrhea, urinary frequency and decreased mobility.  Will recommend Victoza given her morbid obesity .  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 Jacqualyn Posey with periodic checkups and use of therapeutic shoes.  She is requesting new shoes /annually   Lab Results  Component Value Date   HGBA1C 7.4 (H) 03/04/2017   Lab Results  Component Value Date   MICROALBUR 9.5 (H) 06/29/2016

## 2017-03-05 NOTE — Assessment & Plan Note (Signed)
Most recently August 5 while eating at a [employer].  No evidence of shoulder, wrist, or hand fractures by x rays done toda.  Head CT and maxillofacial CT were done in ER after the fall.

## 2017-03-05 NOTE — Assessment & Plan Note (Signed)
Chronic , without true vertigo .  Workup negative for ischemia and arrhythmia. Advised to use a walker but prefers to use a cane,  Continues to fall.

## 2017-03-05 NOTE — Assessment & Plan Note (Signed)
Etiology likely an underestimation of her food intake vs very low metabolic rate secondary to extremely sedentary lifestyle attributed to chronic diarrhea and chronic dizziness.  She has a history of remote  gastric bypass, but has regained all of the weight lost and now has OHS. Recommended use of stationery bike for exercise  ,

## 2017-03-05 NOTE — Assessment & Plan Note (Signed)
She is requesting additional sedating medications, which I have refused.  Advised to limit fioricet to daytime since it has caffeine in it, and admonished to stop watching TV in bed. Also advised to suspend TV watching given her "concussion."

## 2017-03-05 NOTE — Assessment & Plan Note (Signed)
Elevated today due to pain. No changes given history of recurrent hypotension

## 2017-03-05 NOTE — Assessment & Plan Note (Addendum)
Palin films obtained today are negative for fracture and acute changes but not spurring and degenerative changes which are chronic . Recommend seeing Orthopedics for consideration of MRI/joint injection/PT.

## 2017-03-06 ENCOUNTER — Ambulatory Visit (INDEPENDENT_AMBULATORY_CARE_PROVIDER_SITE_OTHER): Payer: Medicare Other | Admitting: Internal Medicine

## 2017-03-06 ENCOUNTER — Encounter: Payer: Self-pay | Admitting: Internal Medicine

## 2017-03-06 VITALS — BP 130/78 | HR 63 | Ht 63.5 in | Wt 242.5 lb

## 2017-03-06 DIAGNOSIS — W19XXXD Unspecified fall, subsequent encounter: Secondary | ICD-10-CM

## 2017-03-06 DIAGNOSIS — R0602 Shortness of breath: Secondary | ICD-10-CM

## 2017-03-06 DIAGNOSIS — I10 Essential (primary) hypertension: Secondary | ICD-10-CM | POA: Diagnosis not present

## 2017-03-06 DIAGNOSIS — R0789 Other chest pain: Secondary | ICD-10-CM

## 2017-03-06 MED ORDER — AMLODIPINE BESYLATE 5 MG PO TABS
5.0000 mg | ORAL_TABLET | Freq: Every day | ORAL | 3 refills | Status: DC
Start: 2017-03-06 — End: 2017-08-21

## 2017-03-06 MED ORDER — ROSUVASTATIN CALCIUM 10 MG PO TABS: 10.0000 mg | ORAL_TABLET | Freq: Every day | ORAL | 3 refills | Status: DC

## 2017-03-06 MED ORDER — CARVEDILOL 3.125 MG PO TABS: 3.1250 mg | ORAL_TABLET | Freq: Two times a day (BID) | ORAL | 3 refills | Status: DC

## 2017-03-06 MED ORDER — ASPIRIN EC 81 MG PO TBEC: 81.0000 mg | DELAYED_RELEASE_TABLET | Freq: Every day | ORAL | 3 refills | Status: DC

## 2017-03-06 NOTE — Progress Notes (Signed)
Follow-up Outpatient Visit Date: 03/06/2017  Primary Care Provider: [name], MD [address] [place] [address]  Chief Complaint: Left forearm and right shoulder pain after recent fall.  HPI:  Denise Sharp is a 71 y.o. 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 I last saw her in early May, which time she was doing relatively well. His Hinesley has not had any episodes of chest pain, short of breath, palpitations, or lightheadedness since our last visit. She has chronic leg edema, which is unchanged.  Earlier this month, Denise Sharp tripped while at [employer], falling onto her face. She also injured her left forearm and right shoulder. She was evaluated in the emergency department and was found to have multiple facial contusions as well as a possible concussion. She continues to have some dizziness that she attributes to the concussion. She also has pain in the left forearm and right shoulder, for which radiographs were recently performed by her PCP.  --------------------------------------------------------------------------------------------------  Cardiovascular History & Procedures: Cardiovascular Problems:  Intermittent left bundle branch block  Chronic atypical chest pain and shortness of breath  Risk Factors:  Hypertension, hyperlipidemia, diabetes mellitus, obesity, sedentary lifestyle, and age greater than 99  Cath/PCI:  LHC (07/24/12, [facility]): LMCA normal. LAD, LCx, and RCA with minor luminal irregularities.  LHC (04/15/86, [facility]): Normal  LMCA, LAD, LCx, and RCA. LVEF 65%.  CV Surgery:  None  EP Procedures and Devices:  None  Non-Invasive Evaluation(s):  Cardiac CT calcium score (09/03/16, Dr. Humphrey Rolls): Coronary calcium score 1548.  Pharmacologic myocardial perfusion stress test (08/08/16, Dr. Humphrey Rolls): "Equivocal stress test" with small inferior wall reversible defect and hyperdynamic LV contraction (EF 87%).  Transthoracic echocardiogram (08/06/16, Dr. Darrow Bussing): Technically difficult study with mildly dilated left ventricle. LVEF was normal with normal wall motion. Grade 1 diastolic dysfunction. Mildly dilated RV with normal contraction. Mitral annular calcification present with mild MR. Mild PR and TR also present. Normal pulmonary artery pressure. Severe left and mild right atrial enlargement.  Pharmacologic myocardial perfusion stress test (08/11/15, [facility]): Normal perfusion without ischemia or scar. LVEF 81%.  Recent CV Pertinent Labs: Lab Results  Component Value Date   CHOL 196 06/29/2016   CHOL 155 02/27/2014   HDL 55.80 06/29/2016   HDL 43 02/27/2014   LDLCALC 105 (H) 06/29/2016   LDLCALC 88 02/27/2014   LDLDIRECT 111.0 06/16/2015   TRIG 179.0 (H) 06/29/2016   TRIG 121 02/27/2014   CHOLHDL 4 06/29/2016   K 3.7 03/04/2017   K 4.0 06/21/2014   MG 1.5 (L) 08/05/2012   BUN 12 03/04/2017   BUN 11 10/20/2014   BUN 12 06/21/2014   CREATININE 0.84 03/04/2017   CREATININE 0.84 08/13/2014    Past medical and surgical history were reviewed and updated in EPIC.  Outpatient Encounter Prescriptions as of 03/06/2017  Medication Sig  . albuterol (PROVENTIL HFA;VENTOLIN HFA) 108 (90 Base) MCG/ACT inhaler Inhale 2 puffs into the lungs every 8 (eight) hours as needed for wheezing or shortness of breath.  [name] amLODipine (NORVASC) 5 MG tablet Take 1 tablet (5 mg total) by mouth daily.  [name] aspirin EC 81 MG tablet Take 1 tablet (81 mg total) by mouth daily.  [name]  benzonatate (TESSALON) 200 MG capsule Take 200 mg by mouth 3  (three) times daily as needed for cough.  . butalbital-acetaminophen-caffeine (FIORICET, ESGIC) 50-325-40 MG tablet Take 1 tablet by mouth every 6 (six) hours as needed for headache.  . carvedilol (COREG) 3.125 MG tablet Take 1 tablet (3.125 mg total) by mouth 2 (two) times daily.  . Cholecalciferol (VITAMIN D PO) Take 10,000 Units by mouth daily.  . cholestyramine light (PREVALITE) 4 GM/DOSE powder Take 4 g by mouth 2 (two) times daily.  . diazepam (VALIUM) 10 MG tablet Take 1 tablet (10 mg total) by mouth at bedtime. 1 TABLET DAILY AS NEEDED FOR ANXIETY OR INSOMNIA  . diphenhydrAMINE (BENADRYL) 25 MG tablet Take 25 mg by mouth every 6 (six) hours as needed.  . DULoxetine (CYMBALTA) 30 MG capsule Take 1 capsule (30 mg total) by mouth 2 (two) times daily.  . hydrocodone-acetaminophen (ZYDONE) 5-400 MG tablet Take 1 tablet by mouth every 6 (six) hours as needed for pain.  . hyoscyamine (LEVSIN SL) 0.125 MG SL tablet Place 0.125 mg under the tongue 2 (two) times daily.  [name] levETIRAcetam (KEPPRA) 500 MG tablet Take 1 tablet (500 mg total) by mouth 2 (two) times daily.  Johnnye Sima 2 MG TABS tablet Take 1 tablet (2 mg total) by mouth at bedtime as needed for sleep. Take immediately before bedtime  . mometasone (ELOCON) 0.1 % lotion APPLY 4 DROPS TO ITCHY EAR TWICE A DAY FOR 10 DAYS, MAY REPEAT AFTER 3 WEEKS IF ITCHES AGAIN  . ondansetron (ZOFRAN) 4 MG tablet Take 1 tablet (4 mg total) by mouth every 8 (eight) hours as needed for nausea or vomiting.  . pregabalin (LYRICA) 100 MG capsule Take 1 capsule (100 mg total) by mouth 2 (two) times daily.  [name] rOPINIRole (REQUIP) 1 MG tablet Take 1 tablet (1 mg total) by mouth at bedtime.  . rosuvastatin (CRESTOR) 10 MG tablet Take 1 tablet (10 mg total) by mouth daily.  . sitaGLIPtin (JANUVIA) 100 MG tablet Take 1 tablet (100 mg total) by mouth daily.  . tizanidine (ZANAFLEX) 2 MG capsule Take 2 mg by mouth 3 (three) times daily.  . traMADol (ULTRAM) 50 MG tablet  Take 1 tablet (50 mg total) by mouth daily as needed.  . [DISCONTINUED] amLODipine (NORVASC) 5 MG tablet Take 1 tablet (5 mg total) by mouth daily.  . [DISCONTINUED] aspirin EC 81 MG tablet Take 81 mg by mouth daily.  . [DISCONTINUED] carvedilol (COREG) 3.125 MG tablet TAKE 1 TABLET (3.125 MG TOTAL) BY MOUTH 2 (TWO) TIMES DAILY.  . [DISCONTINUED] rosuvastatin (CRESTOR) 10 MG tablet Take 1 tablet (10 mg total) by mouth daily.   No facility-administered encounter medications on file as of 03/06/2017.     Allergies: Diovan [valsartan]; Erythromycin; Flagyl [metronidazole]; Glucophage [metformin hcl]; Xanax [alprazolam]; Amoxicillin; Demeclocycline; Erythromycin base; Metformin; Sulfa antibiotics; and Tetracyclines & related  Social History   Social History  . Marital status: Married    Spouse name: [name]   . Number of children: 2  . Years of education: College    Occupational History  .  Retired   Social History Main Topics  . Smoking status: Never Smoker  . Smokeless tobacco: Never Used  . Alcohol use No  . Drug use: No  . Sexual activity: Not Currently   Other Topics Concern  . Not on file   Social History Narrative   Patient lives at home with husband [name].    Patient has 2 children.  Patient has [employer].    Patient is on disability.    Patient is right handed.     Family History  Problem Relation Age of Onset  . Heart disease Father   . Hypertension Father   . Prostate cancer Father   . Stroke Father   . Osteoporosis Father   . Stroke Mother   . Depression Mother   . Headache Mother   . Heart disease Mother   . Thyroid disease Mother   . Hypertension Mother   . Diabetes Daughter   . Heart disease Daughter   . Hypertension Daughter   . Hypertension Son     Review of Systems: A 12-system review of systems was performed and was negative except as noted in the  HPI.  --------------------------------------------------------------------------------------------------  Physical Exam: BP 130/78 (BP Location: Left Arm, Patient Position: Sitting, Cuff Size: Normal)   Pulse 63   Ht 5' 3.5" (1.613 m)   Wt 242 lb 8 oz (110 kg)   BMI 42.28 kg/m   General:  Morbidly obese woman, seated comfortably in the exam room. HEENT: No conjunctival pallor or scleral icterus.  Moist mucous membranes.  OP clear. Neck: Supple without lymphadenopathy or thyromegaly. No gross JVD, though body habitus limits evaluation. Lungs: Normal work of breathing.  Clear to auscultation bilaterally without wheezes or crackles. Heart: Distant heart sounds. Regular rate and rhythm without murmurs. Unable to assess PMI due to body habitus.. Abd: Bowel sounds present.  Soft, nontender, and nondistended. Unable to assess hepatosplenomegaly due to body habitus. Ext: Trace pretibial edema bilaterally. 2+ radial, posterior tibial, and dorsalis pedis pulses bilaterally. Skin: Warm and dry . Faint bruising noted near the left wrist.  EKG: Normal sinus rhythm without significant abnormalities. Heart rate 63 bpm.  Lab Results  Component Value Date   WBC 9.9 01/31/2017   HGB 13.3 01/31/2017   HCT 39.9 01/31/2017   MCV 87.0 01/31/2017   PLT 298 01/31/2017    Lab Results  Component Value Date   NA 139 03/04/2017   K 3.7 03/04/2017   CL 105 03/04/2017   CO2 22 03/04/2017   BUN 12 03/04/2017   CREATININE 0.84 03/04/2017   GLUCOSE 224 (H) 03/04/2017   ALT 13 03/04/2017    Lab Results  Component Value Date   CHOL 196 06/29/2016   HDL 55.80 06/29/2016   LDLCALC 105 (H) 06/29/2016   LDLDIRECT 111.0 06/16/2015   TRIG 179.0 (H) 06/29/2016   CHOLHDL 4 06/29/2016    --------------------------------------------------------------------------------------------------  ASSESSMENT AND PLAN: Hypertension Blood pressure is upper normal today. We will continue current regimen of carvedilol  and amlodipine.  Atypical chest pain and shortness of breath Overall, symptoms have improved since her last visit. EKG today is normal. I suspect that her previously noted intermittent left bundle branch block is somewhat rate dependent. Given multiple negative evaluations in the past and lack of new symptoms, we will defer further workup. Continue current medications for primary prevention.  Fall with facial contusions, right shoulder pain, and left wrist pain Significant pain still reported by the patient. Recent radiographs did not show any acute abnormalities. I will defer further management to [name].  Follow-up: Return to clinic in 6 months.  [name], MD 03/06/2017 9:15 PM

## 2017-03-06 NOTE — Patient Instructions (Signed)
Medication Instructions:  Your physician recommends that you continue on your current medications as directed. Please refer to the Current Medication list given to you today.   Labwork: none  Testing/Procedures: none  Follow-Up: Your physician wants you to follow-up in: 6 MONTHS WITH DR END. You will receive a reminder letter in the mail two months in advance. If you don't receive a letter, please call our office to schedule the follow-up appointment.   If you need a refill on your cardiac medications before your next appointment, please call your pharmacy.

## 2017-03-11 DIAGNOSIS — H10023 Other mucopurulent conjunctivitis, bilateral: Secondary | ICD-10-CM | POA: Diagnosis not present

## 2017-03-19 NOTE — Telephone Encounter (Signed)
Mailed unread message to patient.  

## 2017-03-19 NOTE — Telephone Encounter (Signed)
Mailed unread message to patient. thanks 

## 2017-04-03 DIAGNOSIS — E113393 Type 2 diabetes mellitus with moderate nonproliferative diabetic retinopathy without macular edema, bilateral: Secondary | ICD-10-CM | POA: Diagnosis not present

## 2017-04-08 ENCOUNTER — Telehealth: Payer: Self-pay | Admitting: *Deleted

## 2017-04-08 DIAGNOSIS — G8929 Other chronic pain: Secondary | ICD-10-CM

## 2017-04-08 DIAGNOSIS — M25511 Pain in right shoulder: Principal | ICD-10-CM

## 2017-04-08 NOTE — Telephone Encounter (Signed)
Please advise 

## 2017-04-08 NOTE — Telephone Encounter (Signed)
Pt is requesting to have a referral for her shoulder pain. Pt was advised to call if her shoulder pain continued Pt contact [phone]

## 2017-04-17 DIAGNOSIS — H18211 Corneal edema secondary to contact lens, right eye: Secondary | ICD-10-CM | POA: Diagnosis not present

## 2017-04-22 ENCOUNTER — Other Ambulatory Visit: Payer: Self-pay | Admitting: Internal Medicine

## 2017-04-22 NOTE — Telephone Encounter (Signed)
Pt states that she has called the last four weeks asking for referral and no one has gotten back to her. Please call her, today.  Thanks

## 2017-04-22 NOTE — Telephone Encounter (Signed)
Pt needs the following refills;pregabalin (LYRICA) 100 MG capsule, (Pt is taking 200 mg a night),  diazepam (VALIUM) 10 MG tablet, traMADol (ULTRAM) 50 MG tablet.  Please sent to mail order and must be a 90 day supply. Mail order please.

## 2017-04-22 NOTE — Telephone Encounter (Signed)
Looks like the message never got routed by mistake. Pt is wanting to referral for shoulder pain. Please advise.

## 2017-04-22 NOTE — Telephone Encounter (Signed)
Referral made 

## 2017-04-23 ENCOUNTER — Other Ambulatory Visit: Payer: Self-pay

## 2017-04-23 MED ORDER — PREGABALIN 100 MG PO CAPS: 100.0000 mg | ORAL_CAPSULE | Freq: Two times a day (BID) | ORAL | 2 refills | Status: DC

## 2017-04-23 MED ORDER — DIAZEPAM 10 MG PO TABS: 10.0000 mg | ORAL_TABLET | Freq: Every day | ORAL | 5 refills | Status: DC

## 2017-04-23 MED ORDER — TRAMADOL HCL 50 MG PO TABS: 50.0000 mg | ORAL_TABLET | Freq: Every day | ORAL | 5 refills | Status: DC | PRN

## 2017-04-23 NOTE — Telephone Encounter (Signed)
I will NOT refill valium OR TRAMADOL  for a 90 day supply,  They are controlled substances and must be filled locally.

## 2017-04-23 NOTE — Telephone Encounter (Signed)
[name] put in a referral for this pt yesterday. Pt stated that she can not see anyone at [facility] clinic anymore. She stated that she would like to see Dr. Para March at [facility].

## 2017-04-23 NOTE — Telephone Encounter (Signed)
Last OV: 03/04/2017 Next OV: 06/04/2017  Diazepam refilled: 02/13/2017  Lyrica refilled: 03/22/2016  Tramadol refilled: 02/13/2017

## 2017-04-24 NOTE — Telephone Encounter (Signed)
Entered in error

## 2017-04-30 NOTE — Telephone Encounter (Signed)
Spoke with pt and informed her of Dr. Lupita Dawn message below. The pt stated that she didn't need a 90 day supply of the tramadol or the valium just a 30. Told pt that they were all faxed to the [facility] on 04/23/2017. Pt gave a verbal understanding.

## 2017-05-01 DIAGNOSIS — M19011 Primary osteoarthritis, right shoulder: Secondary | ICD-10-CM | POA: Diagnosis not present

## 2017-05-06 ENCOUNTER — Ambulatory Visit (INDEPENDENT_AMBULATORY_CARE_PROVIDER_SITE_OTHER): Payer: Medicare Other

## 2017-05-06 ENCOUNTER — Ambulatory Visit (INDEPENDENT_AMBULATORY_CARE_PROVIDER_SITE_OTHER): Payer: Medicare Other | Admitting: Podiatry

## 2017-05-06 ENCOUNTER — Other Ambulatory Visit: Payer: Self-pay | Admitting: Podiatry

## 2017-05-06 DIAGNOSIS — M129 Arthropathy, unspecified: Secondary | ICD-10-CM

## 2017-05-06 DIAGNOSIS — M79671 Pain in right foot: Secondary | ICD-10-CM

## 2017-05-06 DIAGNOSIS — E11610 Type 2 diabetes mellitus with diabetic neuropathic arthropathy: Secondary | ICD-10-CM

## 2017-05-06 DIAGNOSIS — B351 Tinea unguium: Secondary | ICD-10-CM

## 2017-05-06 DIAGNOSIS — E1149 Type 2 diabetes mellitus with other diabetic neurological complication: Secondary | ICD-10-CM | POA: Diagnosis not present

## 2017-05-06 DIAGNOSIS — M79676 Pain in unspecified toe(s): Secondary | ICD-10-CM

## 2017-05-06 NOTE — Progress Notes (Addendum)
Complaint:  Visit Type: Patient returns to my office for continued preventative foot care services. Complaint: Patient states" my nails have grown long and thick and become painful to walk and wear shoes" Patient has been diagnosed with DM with neuropathy. The patient presents for preventative foot care services. No changes to ROS.  Patient says she has  no feeling in her feet.  She says she is also concerned she developed two knots on her right foot.  She says she is unaware when these knots developed since she has no feeling in her right foot.    Podiatric Exam: Vascular: dorsalis pedis and posterior tibial pulses are palpable bilateral. Capillary return is immediate. Temperature gradient is WNL. Skin turgor WNL  Sensorium: Absent  Semmes Weinstein monofilament test. Normal tactile sensation bilaterally. Nail Exam: Pt has thick disfigured discolored nails with subungual debris noted bilateral entire nail hallux through fifth toenails Ulcer Exam: There is no evidence of ulcer or pre-ulcerative changes or infection. Orthopedic Exam: Muscle tone and strength are WNL. No limitations in general ROM. No crepitus or effusions noted. Foot type and digits show no abnormalities. Bony prominences are unremarkable. Severe rearfoot  DJD  B/L  She has developed a metatarsus adductus right foot.  Swelling and increased temperature noted in the dorsum of her right foot.  No pain due to neuropathy. Skin: No Porokeratosis. No infection or ulcers  Diagnosis:  Onychomycosis, , Pain in right toe, pain in left toes Diabetes with neuropathy  Charcot right foot.  Treatment & Plan Procedures and Treatment: Consent by patient was obtained for treatment procedures. The patient understood the discussion of treatment and procedures well. All questions were answered thoroughly reviewed. Debridement of mycotic and hypertrophic toenails, 1 through 5 bilateral and clearing of subungual debris. No ulceration, no infection noted.  Following preventative foot care services. X-rays were taken of her right foot. The x-rays reveal there is a separation of the midtarsal bones in a previously Charcot developed foot. Arthritic changes are noted at the midfoot on her right foot.  I diagnosed her as having an active Charcot foot. Due to the acute nature of her right foot and my x-ray findings.  Patient was given a Dispensing optician and told she needs to ambulate in the [employer].  She was also told to limit her walking as much as possible to allow healing to occur in her foot.  Padding was put into the Cam Walker to maintain the arch during the healing process.  Patient was told to return to the office in 4 weeks for further evaluation and treatment. Patient was told to call to the office if she has any questions or concerns. Anklet was prescribed for her right foot. ABN signed for 2018. Return Visit-Office Procedure: Patient instructed to return to the office for a follow up visit 3 months for continued evaluation and treatment.    Gardiner Barefoot DPM

## 2017-05-13 ENCOUNTER — Ambulatory Visit (INDEPENDENT_AMBULATORY_CARE_PROVIDER_SITE_OTHER): Payer: Medicare Other | Admitting: Podiatry

## 2017-05-13 DIAGNOSIS — L989 Disorder of the skin and subcutaneous tissue, unspecified: Secondary | ICD-10-CM | POA: Diagnosis not present

## 2017-05-13 NOTE — Progress Notes (Signed)
She presents today concerned that a small area of darkness on the plantar forefoot is another ulceration.  Objective: She presents recently been diagnosed with Charcot deformity and fracture across her foot. Small area of discoloration plantar forefoot does not demonstrate any type of skin breakdown nor does it demonstrate any type of foreign body. It appears to be a small pinch callus that may have resulted from the [employer] or from her sock in the [employer]. I see no signs of skin breakdown.  Assessment: Charcot arthropathy.  Plan: Follow up with me on an as-needed basis

## 2017-05-22 ENCOUNTER — Ambulatory Visit: Payer: Medicare Other | Admitting: Internal Medicine

## 2017-06-03 ENCOUNTER — Encounter: Payer: Self-pay | Admitting: Podiatry

## 2017-06-03 ENCOUNTER — Ambulatory Visit: Payer: Medicare Other | Admitting: Podiatry

## 2017-06-03 ENCOUNTER — Ambulatory Visit (INDEPENDENT_AMBULATORY_CARE_PROVIDER_SITE_OTHER): Payer: Medicare Other | Admitting: Podiatry

## 2017-06-03 DIAGNOSIS — E1149 Type 2 diabetes mellitus with other diabetic neurological complication: Secondary | ICD-10-CM | POA: Diagnosis not present

## 2017-06-03 DIAGNOSIS — M129 Arthropathy, unspecified: Secondary | ICD-10-CM

## 2017-06-03 NOTE — Progress Notes (Signed)
This patient returns to the office for continued evaluation of a Charcot foot deformity, right foot.  She says she has been wearing her cam walker and using her scooter to get around.  She says she has been sleeping with her cam walker and has developed multiple skin lesions. She says from wearing a Cam Walker during sleep.  She continues to have no pain or discomfort on her right foot. Due to her neuropathy. She does say that her toes have assumed a better position.  She presents the office today for continued evaluation and treatment of her right foot.  General Appearance  Alert, conversant and in no acute stress.  Vascular  Dorsalis pedis and posterior pulses are palpable  bilaterally.  Capillary return is within normal limits  Bilaterally. Temperature is within normal limits  Bilaterally  Neurologic  Senn-Weinstein monofilament wire test absent   bilaterally. Muscle power  Within normal limits bilaterally.  Nails Thick disfigured discolored nails with subungual debride bilaterally from hallux to fifth toes bilaterally. No evidence of bacterial infection or drainage bilaterally.  Orthopedic  No limitations of motion of motion feet bilaterally.  No crepitus or effusions noted.  Examination of her right foot does reveal minimal swelling noted at the level of the [facility] joint, right foot. The rear foot is dorsiflexed over the Lisfranc's joint, right foot.  There is diminished swelling noted and decreased temperature noted at this visit.    Skin  normotropic skin with no porokeratosis noted bilaterally.  No signs of infections or ulcers noted.    Charcot foot right  ROV  Discussed this condition with this patient.  There appears to be decreased temperature and swelling noted in the right foot and it is consolidating.   I therefore told her she can experiment and wear her diabetic shoe for an hour daily, but I would prefer her to continue with the [place] for the next 4 weeks.  Return to the  clinic in 4 weeks for further evaluation and treatment.   Gardiner Barefoot DPM

## 2017-06-04 ENCOUNTER — Ambulatory Visit (INDEPENDENT_AMBULATORY_CARE_PROVIDER_SITE_OTHER): Payer: Medicare Other | Admitting: Internal Medicine

## 2017-06-04 ENCOUNTER — Encounter: Payer: Self-pay | Admitting: Internal Medicine

## 2017-06-04 VITALS — BP 114/70 | HR 80 | Temp 98.3°F | Resp 16 | Ht 63.5 in | Wt 247.8 lb

## 2017-06-04 DIAGNOSIS — E538 Deficiency of other specified B group vitamins: Secondary | ICD-10-CM

## 2017-06-04 DIAGNOSIS — Z6841 Body Mass Index (BMI) 40.0 and over, adult: Secondary | ICD-10-CM | POA: Diagnosis not present

## 2017-06-04 DIAGNOSIS — E1142 Type 2 diabetes mellitus with diabetic polyneuropathy: Secondary | ICD-10-CM

## 2017-06-04 DIAGNOSIS — R339 Retention of urine, unspecified: Secondary | ICD-10-CM | POA: Diagnosis not present

## 2017-06-04 DIAGNOSIS — R350 Frequency of micturition: Secondary | ICD-10-CM | POA: Diagnosis not present

## 2017-06-04 DIAGNOSIS — N3281 Overactive bladder: Secondary | ICD-10-CM | POA: Diagnosis not present

## 2017-06-04 DIAGNOSIS — E559 Vitamin D deficiency, unspecified: Secondary | ICD-10-CM

## 2017-06-04 LAB — COMPREHENSIVE METABOLIC PANEL
ALT: 16 U/L (ref 0–35)
AST: 18 U/L (ref 0–37)
Albumin: 3.7 g/dL (ref 3.5–5.2)
Alkaline Phosphatase: 120 U/L — ABNORMAL HIGH (ref 39–117)
BUN: 16 mg/dL (ref 6–23)
CO2: 26 mEq/L (ref 19–32)
Calcium: 9.4 mg/dL (ref 8.4–10.5)
Chloride: 102 mEq/L (ref 96–112)
Creatinine, Ser: 0.8 mg/dL (ref 0.40–1.20)
GFR: 75 mL/min (ref >60.00)
Glucose, Bld: 106 mg/dL — ABNORMAL HIGH (ref 70–99)
Potassium: 4.2 mEq/L (ref 3.5–5.1)
Sodium: 138 mEq/L (ref 135–145)
Total Bilirubin: 0.4 mg/dL (ref 0.2–1.2)
Total Protein: 7.4 g/dL (ref 6.0–8.3)

## 2017-06-04 LAB — LIPID PANEL
Cholesterol: 183 mg/dL (ref 0–200)
HDL: 48.5 mg/dL (ref >39.00)
LDL Cholesterol: 96 mg/dL (ref 0–99)
NonHDL: 134.54
Total CHOL/HDL Ratio: 4
Triglycerides: 195 mg/dL — ABNORMAL HIGH (ref 0.0–149.0)
VLDL: 39 mg/dL (ref 0.0–40.0)

## 2017-06-04 LAB — VITAMIN D 25 HYDROXY (VIT D DEFICIENCY, FRACTURES): VITD: 21.65 ng/mL — ABNORMAL LOW (ref 30.00–100.00)

## 2017-06-04 LAB — MICROALBUMIN / CREATININE URINE RATIO
Creatinine,U: 111.8 mg/dL
Microalb Creat Ratio: 2.5 mg/g (ref 0.0–30.0)
Microalb, Ur: 2.8 mg/dL — ABNORMAL HIGH (ref 0.0–1.9)

## 2017-06-04 LAB — VITAMIN B12: Vitamin B-12: >1500 pg/mL — ABNORMAL HIGH (ref 211–911)

## 2017-06-04 LAB — HEMOGLOBIN A1C: Hgb A1c MFr Bld: 8.1 % — ABNORMAL HIGH (ref 4.6–6.5)

## 2017-06-04 NOTE — Progress Notes (Signed)
Subjective:  Patient ID: Denise Sharp, female    DOB: 10/14/1945  Age: 71 y.o. MRN: [id number]  CC: The primary encounter diagnosis was DM type 2 with diabetic peripheral neuropathy ([place]). Diagnoses of Vitamin D deficiency and B12 deficiency were also pertinent to this visit.  HPI Denise Sharp presents for follow up on chronic issues including  morbid obesity with OSA   Cc:  Early morning panic attacks 2 days in a row.  Wok e up feeling she had run a marathon  bp was low ,  Pulse wasin the 70's.  Felt dyspneic.  Was dizzy on her feet on the way to the bathroom  Wearing a right foot cam walker  for the last month.  has been confined to the bed. 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   2 falls since august.  Last time was 3 nights ago   Shas been elevated due to not cooking ,  Waiting to eat until husband is up often 1:00 pm.  Dinner 8:30 pm , fast food.  Fasting 192 today  Taking Januvia  for several years.  Wants to know if there are alternatives .  Discussed jardiance SIDE EFFECTS  Not good choice for her given her decreased ambulation  Wearing her CPAP on the lowest setting ,,  takin B12 and vitamin D   Lab Results  Component Value Date   MICROALBUR 2.8 (H) 06/04/2017     Lab Results  Component Value Date   HGBA1C 8.1 (H) 06/04/2017     Outpatient Medications Prior to Visit  Medication Sig Dispense Refill  . albuterol (PROVENTIL HFA;VENTOLIN HFA) 108 (90 Base) MCG/ACT inhaler Inhale 2 puffs into the lungs every 8 (eight) hours as needed for wheezing or shortness of breath. 1 Inhaler 0  . amLODipine (NORVASC) 5 MG tablet Take 1 tablet (5 mg total) by mouth daily. 90 tablet 3  . aspirin EC 81 MG tablet Take 1 tablet (81 mg total) by mouth daily. 90 tablet 3  . benzonatate (TESSALON) 200 MG capsule Take 200 mg by mouth 3 (three) times daily as needed for cough.    .  butalbital-acetaminophen-caffeine (FIORICET, ESGIC) 50-325-40 MG tablet Take 1 tablet by mouth every 6 (six) hours as needed for headache. 60 tablet 0  . carvedilol (COREG) 3.125 MG tablet Take 1 tablet (3.125 mg total) by mouth 2 (two) times daily. 180 tablet 3  . Cholecalciferol (VITAMIN D PO) Take 10,000 Units by mouth daily.    . cholestyramine light (PREVALITE) 4 GM/DOSE powder Take 4 g by mouth 2 (two) times daily.    . diazepam (VALIUM) 10 MG tablet Take 1 tablet (10 mg total) by mouth at bedtime. 1 TABLET DAILY AS NEEDED FOR ANXIETY OR INSOMNIA 30 tablet 5  . diphenhydrAMINE (BENADRYL) 25 MG tablet Take 25 mg by mouth every 6 (six) hours as needed.    . DULoxetine (CYMBALTA) 30 MG capsule Take 1 capsule (30 mg total) by mouth 2 (two) times daily. 180 capsule 1  . hydrocodone-acetaminophen (ZYDONE) 5-400 MG tablet Take 1 tablet by mouth every 6 (six) hours as needed for pain.    . hyoscyamine (LEVSIN SL) 0.125 MG SL tablet Place 0.125 mg under the tongue 2 (two) times daily.    [name] levETIRAcetam (KEPPRA) 500 MG tablet Take 1 tablet (500 mg total) by mouth 2 (two) times daily.  60 tablet 3  . LUNESTA 2 MG TABS tablet Take 1 tablet (2 mg total) by mouth at bedtime as needed for sleep. Take immediately before bedtime 90 tablet 1  . mometasone (ELOCON) 0.1 % lotion APPLY 4 DROPS TO ITCHY EAR TWICE A DAY FOR 10 DAYS, MAY REPEAT AFTER 3 WEEKS IF ITCHES AGAIN  5  . pregabalin (LYRICA) 100 MG capsule Take 1 capsule (100 mg total) by mouth 2 (two) times daily. 180 capsule 2  . rOPINIRole (REQUIP) 1 MG tablet Take 1 tablet (1 mg total) by mouth at bedtime. 90 tablet 5  . rosuvastatin (CRESTOR) 10 MG tablet Take 1 tablet (10 mg total) by mouth daily. 90 tablet 3  . sitaGLIPtin (JANUVIA) 100 MG tablet Take 1 tablet (100 mg total) by mouth daily. 90 tablet 1  . tizanidine (ZANAFLEX) 2 MG capsule Take 2 mg by mouth 3 (three) times daily.    . traMADol (ULTRAM) 50 MG tablet Take 1 tablet (50 mg total) by mouth  daily as needed. 30 tablet 5   No facility-administered medications prior to visit.     Review of Systems;  Patient denies headache, fevers, malaise, unintentional weight loss, skin rash, eye pain, sinus congestion and sinus pain, sore throat, dysphagia,  hemoptysis , cough, dyspnea, wheezing, chest pain, palpitations, orthopnea, edema, abdominal pain, nausea, melena, diarrhea, constipation, flank pain, dysuria, hematuria, urinary  Frequency, nocturia, numbness, tingling, seizures,  Focal weakness, Loss of consciousness,  Tremor, insomnia, depression, anxiety, and suicidal ideation.      Objective:  BP 114/70 (BP Location: Left Arm, Patient Position: Sitting, Cuff Size: Normal)   Pulse 80   Temp 98.3 F (36.8 C) (Oral)   Resp 16   Ht 5' 3.5" (1.613 m)   Wt 247 lb 12.8 oz (112.4 kg)   SpO2 93%   BMI 43.21 kg/m   BP Readings from Last 3 Encounters:  06/04/17 114/70  03/06/17 130/78  03/04/17 (!) 148/84    Wt Readings from Last 3 Encounters:  06/04/17 247 lb 12.8 oz (112.4 kg)  03/06/17 242 lb 8 oz (110 kg)  03/04/17 242 lb 9.6 oz (110 kg)    General appearance: alert, cooperative and appears stated age Ears: normal TM's and external ear canals both ears Throat: lips, mucosa, and tongue normal; teeth and gums normal Neck: no adenopathy, no carotid bruit, supple, symmetrical, trachea midline and thyroid not enlarged, symmetric, no tenderness/mass/nodules Back: symmetric, no curvature. ROM normal. No CVA tenderness. Lungs: clear to auscultation bilaterally Heart: regular rate and rhythm, S1, S2 normal, no murmur, click, rub or gallop Abdomen: soft, non-tender; bowel sounds normal; no masses,  no organomegaly Pulses: 2+ and symmetric Skin: Skin color, texture, turgor normal. No rashes or lesions Lymph nodes: Cervical, supraclavicular, and axillary nodes normal.  Lab Results  Component Value Date   HGBA1C 8.1 (H) 06/04/2017   HGBA1C 7.4 (H) 03/04/2017   HGBA1C 7.4 (H)  11/19/2016    Lab Results  Component Value Date   CREATININE 0.80 06/04/2017   CREATININE 0.84 03/04/2017   CREATININE 1.14 11/19/2016    Lab Results  Component Value Date   WBC 9.9 01/31/2017   HGB 13.3 01/31/2017   HCT 39.9 01/31/2017   PLT 298 01/31/2017   GLUCOSE 106 (H) 06/04/2017   CHOL 183 06/04/2017   TRIG 195.0 (H) 06/04/2017   HDL 48.50 06/04/2017   LDLDIRECT 111.0 06/16/2015   LDLCALC 96 06/04/2017   ALT 16 06/04/2017   AST 18 06/04/2017  NA 138 06/04/2017   K 4.2 06/04/2017   CL 102 06/04/2017   CREATININE 0.80 06/04/2017   BUN 16 06/04/2017   CO2 26 06/04/2017   TSH 4.400 06/16/2015   HGBA1C 8.1 (H) 06/04/2017   MICROALBUR 2.8 (H) 06/04/2017    Ct Head Wo Contrast  Result Date: 02/17/2017 CLINICAL DATA:  71 year old female status post trip on carpet and fall from standing landing on face. Face and nose pain. EXAM: CT HEAD WITHOUT CONTRAST CT MAXILLOFACIAL WITHOUT CONTRAST TECHNIQUE: Multidetector CT imaging of the head and maxillofacial structures were performed using the standard protocol without intravenous contrast. Multiplanar CT image reconstructions of the maxillofacial structures were also generated. COMPARISON:  Cervical spine CT 09/23/2015. Head CT 02/25/2014. Head CTA 01/10/2012. FINDINGS: CT HEAD FINDINGS Brain: Cerebral volume is stable and normal for age. Scattered and patchy cerebral white matter hypodensity appears stable. No midline shift, ventriculomegaly, mass effect, evidence of mass lesion, intracranial hemorrhage or evidence of cortically based acute infarction. Incidental dural calcifications. Vascular: Calcified atherosclerosis at the skull base. Skull: No skull fracture identified. Hyperostosis frontalis, normal variant. Other: No scalp hematoma. CT MAXILLOFACIAL FINDINGS Osseous: Nasal bones appear stable since 2013. There is soft tissue swelling over the bridge of the nose. Intact mandible. Absent maxillary dentition. No maxilla or zygoma  fracture. Central skullbase appears intact. Visible cervical spine appears stable. Orbits: Normal orbits soft tissues. Bilateral orbital walls are intact. Sinuses: Visualized paranasal sinuses and mastoids are stable and well pneumatized. Soft tissues: Negative noncontrast visualized larynx, pharynx, parapharyngeal spaces, retropharyngeal space, sublingual space, submandibular glands and parotid glands. Mild soft tissues stand stress that mild soft tissue stranding and asymmetry at the left forehead overlying the left frontal sinus which appears intact (series 6, image 14). No subcutaneous gas. IMPRESSION: 1. Left forehead scalp contusion. Soft tissue swelling at the bridge of the nose. However, no nasal bone, face or skull fracture identified. 2. No acute intracranial abnormality and stable non contrast CT appearance of the brain. Electronically Signed   By: Genevie Ann M.D.   On: 02/17/2017 16:10   Ct Maxillofacial Wo Contrast  Result Date: 02/17/2017 CLINICAL DATA:  71 year old female status post trip on carpet and fall from standing landing on face. Face and nose pain. EXAM: CT HEAD WITHOUT CONTRAST CT MAXILLOFACIAL WITHOUT CONTRAST TECHNIQUE: Multidetector CT imaging of the head and maxillofacial structures were performed using the standard protocol without intravenous contrast. Multiplanar CT image reconstructions of the maxillofacial structures were also generated. COMPARISON:  Cervical spine CT 09/23/2015. Head CT 02/25/2014. Head CTA 01/10/2012. FINDINGS: CT HEAD FINDINGS Brain: Cerebral volume is stable and normal for age. Scattered and patchy cerebral white matter hypodensity appears stable. No midline shift, ventriculomegaly, mass effect, evidence of mass lesion, intracranial hemorrhage or evidence of cortically based acute infarction. Incidental dural calcifications. Vascular: Calcified atherosclerosis at the skull base. Skull: No skull fracture identified. Hyperostosis frontalis, normal variant. Other:  No scalp hematoma. CT MAXILLOFACIAL FINDINGS Osseous: Nasal bones appear stable since 2013. There is soft tissue swelling over the bridge of the nose. Intact mandible. Absent maxillary dentition. No maxilla or zygoma fracture. Central skullbase appears intact. Visible cervical spine appears stable. Orbits: Normal orbits soft tissues. Bilateral orbital walls are intact. Sinuses: Visualized paranasal sinuses and mastoids are stable and well pneumatized. Soft tissues: Negative noncontrast visualized larynx, pharynx, parapharyngeal spaces, retropharyngeal space, sublingual space, submandibular glands and parotid glands. Mild soft tissues stand stress that mild soft tissue stranding and asymmetry at the left forehead overlying the left  frontal sinus which appears intact (series 6, image 14). No subcutaneous gas. IMPRESSION: 1. Left forehead scalp contusion. Soft tissue swelling at the bridge of the nose. However, no nasal bone, face or skull fracture identified. 2. No acute intracranial abnormality and stable non contrast CT appearance of the brain. Electronically Signed   By: Genevie Ann M.D.   On: 02/17/2017 16:10    Assessment & Plan:   Problem List Items Addressed This Visit    DM type 2 with diabetic peripheral neuropathy ([place]) - Primary    Slight loss of control due  To relative immobility ,  No changes today until she can submit BS readings for review  Lab Results  Component Value Date   HGBA1C 8.1 (H) 06/04/2017         Relevant Orders   Hemoglobin A1c (Completed)   Comprehensive metabolic panel (Completed)   Microalbumin / creatinine urine ratio (Completed)   Lipid panel (Completed)   Vitamin D deficiency   Relevant Orders   VITAMIN D 25 Hydroxy (Vit-D Deficiency, Fractures) (Completed)    Other Visit Diagnoses    B12 deficiency       Relevant Orders   B12 (Completed)    A total of 25 minutes of face to face time was spent with patient more than half of which was spent in counselling  about the above mentioned conditions  and coordination of care   I am having [name] D. Sharlett Iles maintain her levETIRAcetam, hydrocodone-acetaminophen, cholestyramine light, hyoscyamine, albuterol, rOPINIRole, tizanidine, diphenhydrAMINE, LUNESTA, Cholecalciferol (VITAMIN D PO), benzonatate, mometasone, sitaGLIPtin, DULoxetine, butalbital-acetaminophen-caffeine, amLODipine, aspirin EC, rosuvastatin, carvedilol, diazepam, pregabalin, and traMADol.  No orders of the defined types were placed in this encounter.   There are no discontinued medications.  Follow-up: No Follow-up on file.   [name], MD

## 2017-06-04 NOTE — Patient Instructions (Addendum)
   Do not skip breakfast !  Use one of the following ready made breakfast entreesfor breakfast:     1) choose one of these Low Carb high Protein premixed Shakes:   Premier Protein  Atkins Advantage Muscle Milk EAS AdvantEdge   OR:    2)  Danton Clap now makes a frozen breakfast frittata  And a frozen egg sandwich (made with the frittata( that can be microwaved in 2 minutes and is very low carb. Frittatas are similar to quiches without the crust  You should be taking 600 mg calcium  via supplement  And get the other 1200 through diet.

## 2017-06-05 ENCOUNTER — Encounter: Payer: Self-pay | Admitting: Internal Medicine

## 2017-06-05 ENCOUNTER — Other Ambulatory Visit: Payer: Self-pay | Admitting: Internal Medicine

## 2017-06-05 MED ORDER — ERGOCALCIFEROL 1.25 MG (50000 UT) PO CAPS: 50000.0000 [IU] | ORAL_CAPSULE | ORAL | 2 refills | Status: DC

## 2017-06-05 NOTE — Progress Notes (Unsigned)
risdol

## 2017-06-06 NOTE — Assessment & Plan Note (Addendum)
Slight loss of control due  To relative immobility ,  No changes today until she can submit BS readings for review  Lab Results  Component Value Date   HGBA1C 8.1 (H) 06/04/2017

## 2017-06-12 ENCOUNTER — Other Ambulatory Visit: Payer: Self-pay | Admitting: Orthopedic Surgery

## 2017-06-12 ENCOUNTER — Encounter: Payer: Self-pay | Admitting: Internal Medicine

## 2017-06-12 ENCOUNTER — Telehealth: Payer: Self-pay | Admitting: Internal Medicine

## 2017-06-12 DIAGNOSIS — Z01818 Encounter for other preprocedural examination: Secondary | ICD-10-CM

## 2017-06-12 DIAGNOSIS — M19011 Primary osteoarthritis, right shoulder: Secondary | ICD-10-CM | POA: Diagnosis not present

## 2017-06-12 DIAGNOSIS — M25511 Pain in right shoulder: Secondary | ICD-10-CM

## 2017-06-12 NOTE — Telephone Encounter (Signed)
Pt's husband dropped off a pre-op clearance form to be signed by [name]. Form is up front in color folder. Please fax when ready.  Thanks.

## 2017-06-12 NOTE — Telephone Encounter (Signed)
Placed in red folder  

## 2017-06-13 ENCOUNTER — Encounter: Payer: Self-pay | Admitting: Internal Medicine

## 2017-06-13 ENCOUNTER — Ambulatory Visit
Admission: RE | Admit: 2017-06-13 | Discharge: 2017-06-13 | Disposition: A | Discharge status: Home / Self Care | Payer: Medicare Other | Source: Ambulatory Visit | Attending: Orthopedic Surgery | Admitting: Orthopedic Surgery

## 2017-06-13 ENCOUNTER — Ambulatory Visit (INDEPENDENT_AMBULATORY_CARE_PROVIDER_SITE_OTHER): Payer: Medicare Other | Admitting: Internal Medicine

## 2017-06-13 ENCOUNTER — Ambulatory Visit (INDEPENDENT_AMBULATORY_CARE_PROVIDER_SITE_OTHER)
Admission: RE | Admit: 2017-06-13 | Discharge: 2017-06-13 | Disposition: A | Discharge status: Home / Self Care | Payer: Medicare Other | Source: Ambulatory Visit | Attending: Internal Medicine | Admitting: Internal Medicine

## 2017-06-13 VITALS — BP 112/72 | HR 85 | Ht 64.0 in | Wt 246.0 lb

## 2017-06-13 DIAGNOSIS — R053 Chronic cough: Secondary | ICD-10-CM

## 2017-06-13 DIAGNOSIS — G4733 Obstructive sleep apnea (adult) (pediatric): Secondary | ICD-10-CM

## 2017-06-13 DIAGNOSIS — R05 Cough: Secondary | ICD-10-CM

## 2017-06-13 DIAGNOSIS — Z9989 Dependence on other enabling machines and devices: Secondary | ICD-10-CM | POA: Diagnosis not present

## 2017-06-13 DIAGNOSIS — E662 Morbid (severe) obesity with alveolar hypoventilation: Secondary | ICD-10-CM

## 2017-06-13 DIAGNOSIS — Z01818 Encounter for other preprocedural examination: Secondary | ICD-10-CM

## 2017-06-13 DIAGNOSIS — M25511 Pain in right shoulder: Secondary | ICD-10-CM

## 2017-06-13 DIAGNOSIS — R059 Cough, unspecified: Secondary | ICD-10-CM

## 2017-06-13 DIAGNOSIS — M19011 Primary osteoarthritis, right shoulder: Secondary | ICD-10-CM | POA: Diagnosis not present

## 2017-06-13 NOTE — Telephone Encounter (Signed)
LMTCB

## 2017-06-13 NOTE — Assessment & Plan Note (Signed)
She is significantly obese and sedentary.  Weight loss effort is important and encouraged.

## 2017-06-13 NOTE — Assessment & Plan Note (Signed)
She and her husband describe good compliance with CPAP and good control of snoring.  She sleeps better with it.  Compliance download satisfactory. Plan-replacement of old machine, changing to AutoPap 4-10, reorder supplies.

## 2017-06-13 NOTE — Telephone Encounter (Signed)
I can only provide medical clearance.  She will need cardiac clearance from Dr End.  Form returned to [place] and referral to Dr End made.

## 2017-06-13 NOTE — Patient Instructions (Signed)
Order- CXR-  Dx cough  Order- [facility]- Please replace old CPAP machine if eligible, auto 4-10, mask of choice, humidifier, supplies, AirView    Dx OSA                           She needs replacement now for mask and supplies please  Please call if I can help

## 2017-06-13 NOTE — Telephone Encounter (Signed)
Please advise 

## 2017-06-13 NOTE — Telephone Encounter (Signed)
Patient said she just missed Jessica's telephone call.  She can be reached on her cellphone at [phone].  Also she wanted to PCP to know after taking the labs she was told that her Vitamin D level is well higher than normal.  Please advise.

## 2017-06-13 NOTE — Progress Notes (Signed)
HPI F never smoker followed for OSA, Hypersomnia, Insomnia complicated by IBS NPSG 0/96/04 at [facility]/ Sleep [facility]   Mild OSA, AHI 13.2/ hr, weight 218 lbs   CPAP 6 NPSG 05/22/15- CPAP Titration study- Optimal 7 cwp,  MSLT 05/23/15 ( she had taken Lyrica the night before during CPAP titration against protocol). 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 Melvyn Novas for pulmonary CPAP 6/  Valium, Lunesta, Zanaflex, Requip FOLLOWS FOR: Wears CPAP nightly. Denies problems with mask /pressure. DME: [facility] ;   Pt c/o head congestion, ear ache, chest discomfort with "hacky" cough, PND and sore throat. Denies fever.  Pt has used OTC Mucinex, Alkaseltzer and antihistamine and nothing seems to be working. Pt also now has a fever blister - requests something be called in to help treat this,  URI x 1 month w head and ear pressure. Tends to avoid abx due to IBS. Has Ventolin HFA for occ use. Comfortable w CPAP 6. Good compliance on download. ? DME Blames sleep disturbance on frequent bathroom trips. Meds help.  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 Melvyn Novas for pulmonary, complicated by DM 2/peripheral neuropathy, degenerative disc disease, pulmonary nodules, DVT 2017, CPAP 6/advanced > today replace machine, auto 4-10 Valium, Lunesta, Zanaflex, Requip Pt is having more SOB with exertion than normal in last few months. Needs surgical clearance pending total right shoulder replacement. She has noticed some increase in dyspnea on exertion with no acute event or obvious infection, chest pain, palpitation.  She  blames inactivity, very sedentary..  Some dry cough relieved by benzonatate which she associates with increased reflux while lying in bed.  She has a Sleep Number bed which she can elevate if she wants to.  Right foot is in a walking boot after fracture. She reports CPAP is comfortable and working well.  Her husband confirms it prevents snoring.  Download indicates 86.7% compliance but does not provide AHI. Machine is older than 5 years and she asks about replacement.  ROS-see HPI   Negative unless "+" Constitutional:    weight loss, night sweats, fevers, chills,+ fatigue, lassitude. HEENT:   + headaches, +ifficulty swallowing, tooth/dental problems, sore throat,       sneezing, itching, ear ache, +nasal congestion, post nasal drip, snoring CV:    chest pain, orthopnea, PND, + swelling in lower extremities, anasarca,                                                    dizziness, palpitations Resp:  +shortness of breath with exertion or at rest.                productive cough,   non-productive cough, coughing up of blood.              change in color of mucus.  wheezing.   Skin:    rash or lesions. GI:  + IBS GU: dysuria, change in color of urine, no urgency or frequency.  MS:   +joint pain, stiffness, decreased range of  motion, back pain. Neuro-     nothing unusual Psych:  change in mood or affect.  depression or anxiety.   memory loss.  OBJ- Physical Exam General- Alert, Oriented, Affect-appropriate, Distress- none acute, + morbidly obese, wheelchair Skin- rash-none, lesions- none, excoriation- none. + Herpetic fever blister lip Lymphadenopathy- none Head- atraumatic            Eyes- Gross vision intact, PERRLA, conjunctivae and secretions clear            Ears- Hearing, canals-normal            Nose- Clear, no-Septal dev, mucus, polyps, erosion, perforation             Throat- Mallampati III , mucosa clear , drainage- none, tonsils- atrophic, + dentures Neck- flexible , trachea  midline, no stridor , thyroid nl, carotid no bruit Chest - symmetrical excursion , unlabored           Heart/CV- RRR , no murmur , no gallop  , no rub, nl s1 s2                           - JVD- none , edema , stasis changes- none, varices- none           Lung- clear to P&A, wheeze- none, cough- none , dullness-none, rub- none, rales-none, +O2 sat 98% RA           Chest wall-  Abd-  Br/ Gen/ Rectal- Not done, not indicated Extrem- cyanosis- none, clubbing, none, atrophy- none, strength- nl, + right foot in walking boot Neuro- grossly intact to observation

## 2017-06-13 NOTE — Assessment & Plan Note (Signed)
She may be correct and blaming reflux.  Reflux precautions were emphasized and we discussed use of adjustable bed to elevate head. Non-CXR

## 2017-06-14 NOTE — Telephone Encounter (Signed)
LMTCB. Need to let pt know that her medical clearance form has been faxed back to [employer].

## 2017-06-14 NOTE — Telephone Encounter (Signed)
Patient is calling back and states that she has a cardiologist appt on 06/19/17. Would like to know if her medical clearance has been sent back to Dr. Mardelle Matte at [facility]? Please advise.

## 2017-06-14 NOTE — Telephone Encounter (Signed)
Patient is aware  Copied from [place] [id number]. Topic: Quick Communication - See Telephone Encounter >> Jun 14, 2017  9:30 AM Adair Laundry, CMA wrote: CRM for notification. See Telephone encounter for:  06/14/17. >> Jun 14, 2017 10:10 AM Boyd Kerbs wrote: Patient was returning a call - There is a CRM but do not see what it was about.  Patient said can call home # [phone] and leave a detailed message.  She said she spoke to someone this morning about a referral to Dr. Thea Alken.

## 2017-06-18 ENCOUNTER — Telehealth: Payer: Self-pay | Admitting: Internal Medicine

## 2017-06-18 NOTE — Telephone Encounter (Signed)
Patient has appointment with Ignacia Bayley, NP tomorrow, 06/19/17.

## 2017-06-18 NOTE — Telephone Encounter (Signed)
   [facility] Pre-operative Risk Assessment    Request for surgical clearance:  1. What type of surgery is being performed? Right total shoulder replacement  2. When is this surgery scheduled? Not listed  3. Are there any medications that need to be held prior to surgery and how long? Not listed  4. Practice name and name of physician performing surgery? [facility], Dr. [name]  5. What is your office phone and fax number? [phone], fax, [phone]  6. Anesthesia type (None, local, MAC, general) ? Not listed   Denise Sharp 06/18/2017, 11:43 AM  _________________________________________________________________   (provider comments below)

## 2017-06-19 ENCOUNTER — Ambulatory Visit (INDEPENDENT_AMBULATORY_CARE_PROVIDER_SITE_OTHER): Payer: Medicare Other | Admitting: Nurse Practitioner

## 2017-06-19 ENCOUNTER — Encounter: Payer: Self-pay | Admitting: Nurse Practitioner

## 2017-06-19 VITALS — BP 130/70 | HR 67 | Ht 64.5 in | Wt 248.5 lb

## 2017-06-19 DIAGNOSIS — E782 Mixed hyperlipidemia: Secondary | ICD-10-CM

## 2017-06-19 DIAGNOSIS — I10 Essential (primary) hypertension: Secondary | ICD-10-CM | POA: Diagnosis not present

## 2017-06-19 DIAGNOSIS — Z01810 Encounter for preprocedural cardiovascular examination: Secondary | ICD-10-CM

## 2017-06-19 DIAGNOSIS — E1142 Type 2 diabetes mellitus with diabetic polyneuropathy: Secondary | ICD-10-CM

## 2017-06-19 NOTE — Progress Notes (Signed)
Office Visit    Patient Name: Denise Sharp Date of Encounter: 06/19/2017  Primary Care Provider:  [name], MD Primary Cardiologist:  [name], MD  Chief Complaint    71 y/o ? with a history of atypical chest pain, hypertension, hyperlipidemia, obesity, diabetes, diabetic neuropathy, and degenerative joint disease pending right shoulder replacement, who presents today for preoperative evaluation.  Past Medical History    Past Medical History:  Diagnosis Date  . Abnormality of gait 03/25/2013  . Adrenal mass, left ([place])   . Anemia    iron deficient post  2 unit txfsn 2009, normal endo/colonoscopy by [facility]  . Arthritis   . Atypical chest pain    a. 04/1986 Cath ([facility]): nl cors, EF 65%; b. 07/2012 Cath [facility]): LM nl, LAD/LCX/RCA w/ min irregs; c. 07/2016 [facility]): "Equivocal" w/ small inf wall rev defect and hyperdynamic LV contraction, EF 87%; d. 08/2016 Cardiac CT Ca2+ score [facility]): Ca2+ score 1548.  [name] Cervical spinal stenosis 1994   due to trauma to back ([employer] accident), has intermittent paralysis and parasthesias  . Cervicogenic headache 03/23/2014  . Depression   . Dizziness    chronic dizziness  . DJD (degenerative joint disease)    a. Chronic R shoulder pain pending R shoulder replacement 07/2017.  [name] Esophageal stenosis March 2011   with transietn outlet obstruction by food, cleared by EGD   . Headache(784.0)   . Hypertension   . IBS (irritable bowel syndrome)   . Left bundle branch block (LBBB)    a. Intermittently present - likely rate related.  . Obesity   . Obstructive sleep apnea    using CPAP  . 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 Restless leg syndrome   . Syncope and collapse 03/12/2014  . Type II diabetes mellitus ([place])    Past Surgical History:  Procedure Laterality Date  . ABDOMINAL HYSTERECTOMY    . APPENDECTOMY    . DIAPHRAGMATIC HERNIA REPAIR  2015  . [place]  . GALLBLADDER SURGERY  resection  .  GASTRIC BYPASS    . GASTRIC BYPASS  2000, 2005   Dr. Debroah Loop  . [facility] IMPLANT PLACEMENT  April 2013   [place]  . JOINT REPLACEMENT  2007   bilateral knee. [name],  [name]  . ROTATOR CUFF REPAIR     right  . [place]  . TOTAL ABDOMINAL HYSTERECTOMY W/ BILATERAL SALPINGOOPHORECTOMY  1974  . UMBILICAL HERNIA REPAIR  Aug 11, 2015    Allergies  Allergies  Allergen Reactions  . Diovan [Valsartan]   . Erythromycin   . Flagyl [Metronidazole]   . Glucophage [Metformin Hcl]   . Xanax [Alprazolam]   . Amoxicillin Diarrhea  . Demeclocycline Hives  . Erythromycin Base   . Metformin   . Sulfa Antibiotics Rash    As child  . Tetracyclines & Related Hives and Rash    History of Present Illness    71 y/o ? with the above complex past medical history including hypertension, diabetes complicated by polyneuropathy, iron deficiency anemia, obesity status post gastric bypass x2, irritable bowel syndrome, esophageal stenosis, chronic dizziness with gait instability, cervicogenic headaches with cervical spine stenosis, obstructive sleep apnea on CPAP, restless leg syndrome adrenal mass, and degenerative joint disease with chronic right shoulder pain being followed by orthopedics in [place].  She has undergone prior cardiac evaluation in the setting of chest pain with normal cardiac catheterization in 1987, minimal nonobstructive disease  noted on catheterization in 2014, and more recently an equivocal stress test in January 2018 followed by coronary calcium scoring in February 2018 with a calcium score of 1548.  It was at that point, that she was referred to Dr. Saunders Revel for possible diagnostic catheterization however, she was not having any chest pain at the time and medical therapy was recommended.  Earlier this year, she suffered a fall with resultant traumatic injuries and subsequent broken right foot.  She is in a soft cast and will remain in that soft cast for another 6 weeks  or so.  She has also had significant right shoulder pain since the fall and is pending a right shoulder replacement tentatively scheduled for late January.  From a cardiac standpoint she has not been having any chest pain.  She does have chronic dyspnea on exertion.  Activity is very limited currently in the setting of foot fracture and nonweightbearing status.  She says even before her foot fracture, activity was very limited.  She does very little housework and gets around supermarkets in a scooter.  For what little she is able to accomplish, she does not experience chest pain.  She is able to complete her own activities of daily living.  She denies PND, orthopnea, palpitations, syncope, edema, or early satiety.  Home Medications    Prior to Admission medications   Medication Sig Start Date End Date Taking? Authorizing Provider  albuterol (PROVENTIL HFA;VENTOLIN HFA) 108 (90 Base) MCG/ACT inhaler Inhale 2 puffs into the lungs every 8 (eight) hours as needed for wheezing or shortness of breath. 05/29/16  Yes Burnard Hawthorne, FNP  amLODipine (NORVASC) 5 MG tablet Take 1 tablet (5 mg total) by mouth daily. 03/06/17  Yes End, Harrell Gave, MD  aspirin EC 81 MG tablet Take 1 tablet (81 mg total) by mouth daily. 03/06/17  Yes End, Harrell Gave, MD  benzonatate (TESSALON) 200 MG capsule Take 200 mg by mouth 3 (three) times daily as needed for cough.   Yes [provider]  butalbital-acetaminophen-caffeine (FIORICET, ESGIC) 50-325-40 MG tablet Take 1 tablet by mouth every 6 (six) hours as needed for headache. 03/05/17  Yes [name], MD  carvedilol (COREG) 3.125 MG tablet Take 1 tablet (3.125 mg total) by mouth 2 (two) times daily. 03/06/17 06/19/17 Yes End, Harrell Gave, MD  Cholecalciferol (VITAMIN D PO) Take 10,000 Units by mouth daily.   Yes [provider]  cholestyramine light (PREVALITE) 4 GM/DOSE powder Take 4 g by mouth 2 (two) times daily. 05/14/16  Yes [provider]    diazepam (VALIUM) 10 MG tablet Take 1 tablet (10 mg total) by mouth at bedtime. 1 TABLET DAILY AS NEEDED FOR ANXIETY OR INSOMNIA 04/23/17  Yes [name], MD  diphenhydrAMINE (BENADRYL) 25 MG tablet Take 25 mg by mouth every 6 (six) hours as needed.   Yes [provider]  DULoxetine (CYMBALTA) 30 MG capsule Take 1 capsule (30 mg total) by mouth 2 (two) times daily. 02/19/17  Yes Kathrynn Ducking, MD  ergocalciferol (DRISDOL) 50000 units capsule Take 1 capsule (50,000 Units total) by mouth once a week. 06/05/17  Yes [name], MD  hydrocodone-acetaminophen (ZYDONE) 5-400 MG tablet Take 1 tablet by mouth every 6 (six) hours as needed for pain.   Yes [provider]  hyoscyamine (LEVSIN SL) 0.125 MG SL tablet Place 0.125 mg under the tongue 2 (two) times daily. 05/14/16  Yes [provider]  levETIRAcetam (KEPPRA) 500 MG tablet Take 1 tablet (  500 mg total) by mouth 2 (two) times daily. 03/22/16  Yes Kathrynn Ducking, MD  LUNESTA 2 MG TABS tablet Take 1 tablet (2 mg total) by mouth at bedtime as needed for sleep. Take immediately before bedtime 11/19/16  Yes [name], MD  mometasone (ELOCON) 0.1 % lotion APPLY 4 DROPS TO ITCHY EAR TWICE A DAY FOR 10 DAYS, MAY REPEAT AFTER 3 WEEKS IF ITCHES AGAIN 01/28/17  Yes [provider]  pregabalin (LYRICA) 100 MG capsule Take 1 capsule (100 mg total) by mouth 2 (two) times daily. 04/23/17  Yes [name], MD  rOPINIRole (REQUIP) 1 MG tablet Take 1 tablet (1 mg total) by mouth at bedtime. 08/15/16  Yes [name], MD  rosuvastatin (CRESTOR) 10 MG tablet Take 1 tablet (10 mg total) by mouth daily. 03/06/17  Yes End, Harrell Gave, MD  sitaGLIPtin (JANUVIA) 100 MG tablet Take 1 tablet (100 mg total) by mouth daily. 02/13/17  Yes [name], MD  tizanidine (ZANAFLEX) 2 MG capsule Take 2 mg by mouth 3 (three) times daily.   Yes [provider]  traMADol (ULTRAM) 50 MG tablet Take 1 tablet (50 mg total) by  mouth daily as needed. 04/23/17  Yes [name], MD    Review of Systems    Biggest complaint is right shoulder pain.  She is recovering from a right foot fracture.  No chest pain.  Chronic dyspnea on exertion.  No PND, orthopnea, palpitations, syncope, edema, or early satiety.  All other systems reviewed and are otherwise negative except as noted above.  Physical Exam    VS:  BP 130/70 (BP Location: Left Arm, Patient Position: Sitting, Cuff Size: Normal)   Pulse 67   Ht 5' 4.5" (1.638 m)   Wt 248 lb 8 oz (112.7 kg)   BMI 42.00 kg/m  , BMI Body mass index is 42 kg/m. GEN: Well nourished, well developed, in no acute distress.  HEENT: normal.  Neck: Supple, no JVD, carotid bruits, or masses. Cardiac: RRR, distant, no murmurs, rubs, or gallops. No clubbing, cyanosis, edema.  Radials/DP/PT 2+ and equal bilaterally.  Respiratory:  Respirations regular and unlabored, clear to auscultation bilaterally. GI: Soft, nontender, nondistended, BS + x 4. MS: no deformity or atrophy. Skin: warm and dry, no rash. Neuro:  Strength and sensation are intact. Psych: Normal affect.  Accessory Clinical Findings    ECG -regular sinus rhythm, 67, no acute ST or T changes.  Assessment & Plan    1.  Chest pain.  No recent recurrence.  Prior catheterization showing minimal nonobstructive CAD in 2014.  She does have elevated calcium score with equivocal stress test in January 2018.  Activity is severely limited at this point by broken right foot and previously by deconditioning and chronic dyspnea on exertion.  No further workup warranted at this time.  She remains on aspirin, beta-blocker, and statin therapy.  2.  Preoperative cardiovascular examination: Patient is pending right shoulder replacement in January.  As above, activity is limited in the setting of chronic deconditioning and currently a right foot fracture.  She does not experience chest pain.  She had always labeled an equivocal stress test  earlier this year with report of a small area of reversibility in the inferior wall.  No apparent high risk features.  In the absence of chest pain, she remains medically managed with beta-blocker, calcium channel blocker, aspirin, and statin therapy.  We do not feel that she requires any further cardiovascular  evaluation prior to surgery.  She should remain on beta-blocker and statin throughout perioperative period.  She is able to hold aspirin preoperatively with plan to resume it postoperatively.  3.  Essential hypertension: Stable on beta-blocker and calcium channel blocker therapy.  4.  Type 2 diabetes mellitus: Followed by primary care.  She is on oral therapy along with statin.  Previously intolerant to an ARB.  5.  Morbid obesity: She is severely deconditioned  in the setting of inactivity.  Hopefully activity level could increase some after cast comes off her right foot.  6.  Disposition: Follow-up with Dr. Saunders Revel in 6 months or sooner if necessary.  Murray Hodgkins, NP 06/19/2017, 5:36 PM

## 2017-06-19 NOTE — Patient Instructions (Signed)
Medication Instructions:  Your physician recommends that you continue on your current medications as directed. Please refer to the Current Medication list given to you today.   Labwork: none  Testing/Procedures: none  Follow-Up: Your physician wants you to follow-up in: 6 MONTHS WITH DR END. You will receive a reminder letter in the mail two months in advance. If you don't receive a letter, please call our office to schedule the follow-up appointment.    If you need a refill on your cardiac medications before your next appointment, please call your pharmacy.

## 2017-06-20 NOTE — Telephone Encounter (Signed)
Clearance form signed by Ignacia Bayley, NP and faxed to Raliegh Ip.

## 2017-07-01 ENCOUNTER — Ambulatory Visit: Payer: Medicare Other | Admitting: Podiatry

## 2017-07-11 ENCOUNTER — Ambulatory Visit: Payer: Medicare Other | Admitting: Podiatry

## 2017-07-11 ENCOUNTER — Ambulatory Visit (INDEPENDENT_AMBULATORY_CARE_PROVIDER_SITE_OTHER): Payer: Medicare Other | Admitting: Podiatry

## 2017-07-11 ENCOUNTER — Encounter: Payer: Self-pay | Admitting: Podiatry

## 2017-07-11 DIAGNOSIS — B351 Tinea unguium: Secondary | ICD-10-CM | POA: Diagnosis not present

## 2017-07-11 DIAGNOSIS — M129 Arthropathy, unspecified: Secondary | ICD-10-CM

## 2017-07-11 DIAGNOSIS — E1149 Type 2 diabetes mellitus with other diabetic neurological complication: Secondary | ICD-10-CM

## 2017-07-11 DIAGNOSIS — M79676 Pain in unspecified toe(s): Secondary | ICD-10-CM | POA: Diagnosis not present

## 2017-07-11 NOTE — Progress Notes (Signed)
This patient presents the office follow-up for an evaluation of her right foot. 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 He presents the office today for continued evaluation of her foot.  She returns to the office wearing a Cam Walker and a compression sock.  She says she has been faithfully staying off of her foot usually being in bed.  She says she thinks her foot  is assuming a better position.  She says she is not feeling much pain or discomfort, but this can be related to her neuropathy.  She does admit today that her left foot has become painful due to a knot on the top of her left foot.  She presents the office today for continued evaluation and treatment of her Charcot foot.   General Appearance  Alert, conversant and in no acute stress.  Vascular  Dorsalis pedis and posterior pulses are palpable  bilaterally.  Capillary return is within normal limits  bilaterally. Temperature is within normal limits  Bilaterally.  Neurologic  Senn-Weinstein monofilament wire test absent   bilaterally. Muscle power within normal limits bilaterally.  Nails Thick disfigured discolored nails with subungual debris bilaterally from hallux to fifth toes bilaterally. No evidence of bacterial infection or drainage bilaterally.  Orthopedic E xamination of her right foot reveals minimal swelling noted at the [facility] joint, right foot.  No evidence of increased temperature or redness noted.  There is a bony exostosis noted on the dorsum of the left foot  Skin  normotropic skin with no porokeratosis noted bilaterally.  No signs of infections or ulcers noted.     Charcot right foot \  ROV  Discussed her Charcot foot and I told her that based on the previous visit and today's visit. She could return to her regular footgear and watch any changes.  She was dispensed another compression sock to limit her swelling right foot.  We discussed a new pair of diabetic shoes but decided to wait until told healing and  positioning of the right foot is finalized. Debridement of her nails 10 was performed.  RTC 10 weeks. She states that she is scheduled for shoulder surgery and expects to be laid up at the end of January, which will allow her foot to continue to heal.   Gardiner Barefoot DPM

## 2017-07-23 ENCOUNTER — Telehealth: Payer: Self-pay | Admitting: Internal Medicine

## 2017-07-23 ENCOUNTER — Telehealth: Payer: Self-pay | Admitting: Neurology

## 2017-07-23 DIAGNOSIS — F5101 Primary insomnia: Secondary | ICD-10-CM

## 2017-07-23 MED ORDER — LUNESTA 2 MG PO TABS: ORAL_TABLET | ORAL | 0 refills | Status: DC

## 2017-07-23 NOTE — Telephone Encounter (Signed)
Last OV 06/04/2017  Next OV none scheduled  Last refill 11/19/2016 #90 with 1 refill.

## 2017-07-23 NOTE — Telephone Encounter (Deleted)
Copied from [place] [id number]. Topic: Quick Communication - Rx Refill/Question >> Jul 23, 2017  1:18 PM Patrice Paradise wrote: Has the patient contacted their pharmacy? Yes.   LUNESTA 2 MG TABS tablet  (Agent: If no, request that the patient contact the pharmacy for the refill.)  Preferred Pharmacy (with phone number or street name):  Send to [facility].   Agent: Please be advised that RX refills may take up to 3 business days. We ask that you follow-up with your pharmacy.

## 2017-07-23 NOTE — Telephone Encounter (Addendum)
Called patient back. She would like prescription sent to [facility]. They provide support for prescriptions d/t spouse being veteran. She did not have a phone or fax. She will call back with this information.

## 2017-07-23 NOTE — Telephone Encounter (Signed)
Copied from [place] [id number]. Topic: Quick Communication - See Telephone Encounter >> Jul 23, 2017  1:36 PM Robina Ade, Helene Kelp D wrote: CRM for notification. See Telephone encounter for: 07/23/17. Patient called and said that her neurology will like to talk to provider CMA about medication refill form [facility]. The office is [facility] Dr. Jannifer Franklin. Their number is [phone]. They need [facility] phone number and fax number. Please call patient with any questions.

## 2017-07-23 NOTE — Telephone Encounter (Signed)
Copied from [place] [phone]. Topic: Quick Communication - Rx Refill/Question >> Jul 23, 2017  1:18 PM Patrice Paradise wrote: Has the patient contacted their pharmacy? Yes.   LUNESTA 2 MG TABS tablet  (Agent: If no, request that the patient contact the pharmacy for the refill.)  Preferred Pharmacy (with phone number or street name):  Send to [facility].   Agent: Please be advised that RX refills may take up to 3 business days. We ask that you follow-up with your pharmacy.

## 2017-07-23 NOTE — Telephone Encounter (Signed)
Please advise 

## 2017-07-23 NOTE — Telephone Encounter (Signed)
Please call and schedule pt

## 2017-07-23 NOTE — Telephone Encounter (Signed)
Pt request refill for DULoxetine (CYMBALTA) 30 MG capsule 90 day supply sent to [facility]. She said this medication is working good.

## 2017-07-23 NOTE — Telephone Encounter (Signed)
Refill for 30 days only.  OFFICE VISIT NEEDED prior to any more refills 

## 2017-07-23 NOTE — Telephone Encounter (Signed)
Request for controlled substance 

## 2017-07-24 NOTE — Telephone Encounter (Signed)
Printed, signed and faxed.  

## 2017-07-25 NOTE — Telephone Encounter (Signed)
Called pt since no return call yet. Asked her to call back with phone/fax number for [facility] to be able to send rx.

## 2017-07-29 ENCOUNTER — Other Ambulatory Visit: Payer: Self-pay | Admitting: Orthopedic Surgery

## 2017-07-29 NOTE — Pre-Procedure Instructions (Signed)
Denise Sharp  07/29/2017      [id number] [name], [place] [address] Phone: [phone] Fax: [phone]  [place], [address] [place] [address] Phone: [phone] Fax: [phone]    Your procedure is scheduled on January 29  Report to [facility] Admitting at 1200 P.M.  Call this number if you have problems the morning of surgery:  [phone]   Remember:  Do not eat food or drink liquids after midnight.  Continue all medications as directed by your physician except follow these medication instructions before surgery below   Take these medicines the morning of surgery with A SIP OF WATER  albuterol (PROVENTIL HFA;VENTOLIN HFA)  amLODipine (NORVASC)  carvedilol (COREG)  DULoxetine (CYMBALTA) hydrALAZINE (APRESOLINE)  If needed ondansetron (ZOFRAN)  tizanidine (ZANAFLEX) traMADol (ULTRAM)  7 days prior to surgery STOP taking any Aspirin(unless otherwise instructed by your surgeon), Aleve, Naproxen, Ibuprofen, Motrin, Advil, Goody's, BC's, all herbal medications, fish oil, and all vitamins  Follow your doctors instructions regarding your Aspirin.  If no instructions were given by your doctor, then you will need to call the prescribing office office to get instructions.     WHAT DO I DO ABOUT MY DIABETES MEDICATION?   [name] Do not take oral diabetes medicines (pills) the morning of surgery. sitaGLIPtin (JANUVIA)    How to Manage Your Diabetes Before and After Surgery  Why is it important to control my blood sugar before and after surgery? . Improving blood sugar levels before and after surgery helps healing and can limit problems. . A way of improving blood sugar control is eating a healthy diet by: o  Eating less sugar and carbohydrates o  Increasing activity/exercise o  Talking with your doctor about reaching your blood sugar  goals . High blood sugars (greater than 180 mg/dL) can raise your risk of infections and slow your recovery, so you will need to focus on controlling your diabetes during the weeks before surgery. . Make sure that the doctor who takes care of your diabetes knows about your planned surgery including the date and location.  How do I manage my blood sugar before surgery? . Check your blood sugar at least 4 times a day, starting 2 days before surgery, to make sure that the level is not too high or low. o Check your blood sugar the morning of your surgery when you wake up and every 2 hours until you get to the Short Stay unit. . If your blood sugar is less than 70 mg/dL, you will need to treat for low blood sugar: o Do not take insulin. o Treat a low blood sugar (less than 70 mg/dL) with  cup of clear juice (cranberry or apple), 4 glucose tablets, OR glucose gel. o Recheck blood sugar in 15 minutes after treatment (to make sure it is greater than 70 mg/dL). If your blood sugar is not greater than 70 mg/dL on recheck, call [phone] for further instructions. . Report your blood sugar to the short stay nurse when you get to Short Stay.  . If you are admitted to the hospital after surgery: o Your blood sugar will be checked by the staff and you will probably be given insulin after surgery (instead of oral diabetes medicines) to make sure you have good blood sugar levels. o The goal for blood sugar control after surgery is  80-180 mg/dL.   Do not wear jewelry, make-up or nail polish.  Do not wear lotions, powders, or perfumes, or deodorant.  Do not shave 48 hours prior to surgery.  Men may shave face and neck.  Do not bring valuables to the hospital.  [facility] is not responsible for any belongings or valuables.  Contacts, dentures or bridgework may not be worn into surgery.  Leave your suitcase in the car.  After surgery it may be brought to your room.  For patients admitted to the hospital,  discharge time will be determined by your treatment team.  Patients discharged the day of surgery will not be allowed to drive home.    Special instructions:   [facility]- Preparing For Surgery  Before surgery, you can play an important role. Because skin is not sterile, your skin needs to be as free of germs as possible. You can reduce the number of germs on your skin by washing with CHG (chlorahexidine gluconate) Soap before surgery.  CHG is an antiseptic cleaner which kills germs and bonds with the skin to continue killing germs even after washing.  Please do not use if you have an allergy to CHG or antibacterial soaps. If your skin becomes reddened/irritated stop using the CHG.  Do not shave (including legs and underarms) for at least 48 hours prior to first CHG shower. It is OK to shave your face.  Please follow these instructions carefully.   1. Shower the NIGHT BEFORE SURGERY and the MORNING OF SURGERY with CHG.   2. If you chose to wash your hair, wash your hair first as usual with your normal shampoo.  3. After you shampoo, rinse your hair and body thoroughly to remove the shampoo.  4. Use CHG as you would any other liquid soap. You can apply CHG directly to the skin and wash gently with a scrungie or a clean washcloth.   5. Apply the CHG Soap to your body ONLY FROM THE NECK DOWN.  Do not use on open wounds or open sores. Avoid contact with your eyes, ears, mouth and genitals (private parts). Wash Face and genitals (private parts)  with your normal soap.  6. Wash thoroughly, paying special attention to the area where your surgery will be performed.  7. Thoroughly rinse your body with warm water from the neck down.  8. DO NOT shower/wash with your normal soap after using and rinsing off the CHG Soap.  9. Pat yourself dry with a CLEAN TOWEL.  10. Wear CLEAN PAJAMAS to bed the night before surgery, wear comfortable clothes the morning of surgery  11. Place CLEAN SHEETS on  your bed the night of your first shower and DO NOT SLEEP WITH PETS.    Day of Surgery: Do not apply any deodorants/lotions. Please wear clean clothes to the hospital/surgery center.      Please read over the following fact sheets that you were given.

## 2017-07-30 ENCOUNTER — Other Ambulatory Visit: Payer: Self-pay

## 2017-07-30 ENCOUNTER — Encounter (HOSPITAL_COMMUNITY)
Admission: RE | Admit: 2017-07-30 | Discharge: 2017-07-30 | Disposition: A | Discharge status: Home / Self Care | Payer: Medicare Other | Source: Ambulatory Visit | Attending: Orthopedic Surgery | Admitting: Orthopedic Surgery

## 2017-07-30 ENCOUNTER — Encounter (HOSPITAL_COMMUNITY): Payer: Self-pay

## 2017-07-30 DIAGNOSIS — D509 Iron deficiency anemia, unspecified: Secondary | ICD-10-CM | POA: Diagnosis not present

## 2017-07-30 DIAGNOSIS — Z7982 Long term (current) use of aspirin: Secondary | ICD-10-CM | POA: Insufficient documentation

## 2017-07-30 DIAGNOSIS — E1142 Type 2 diabetes mellitus with diabetic polyneuropathy: Secondary | ICD-10-CM | POA: Diagnosis not present

## 2017-07-30 DIAGNOSIS — E662 Morbid (severe) obesity with alveolar hypoventilation: Secondary | ICD-10-CM | POA: Insufficient documentation

## 2017-07-30 DIAGNOSIS — R5383 Other fatigue: Secondary | ICD-10-CM | POA: Insufficient documentation

## 2017-07-30 DIAGNOSIS — I10 Essential (primary) hypertension: Secondary | ICD-10-CM | POA: Diagnosis not present

## 2017-07-30 DIAGNOSIS — Z79899 Other long term (current) drug therapy: Secondary | ICD-10-CM | POA: Insufficient documentation

## 2017-07-30 DIAGNOSIS — F329 Major depressive disorder, single episode, unspecified: Secondary | ICD-10-CM | POA: Insufficient documentation

## 2017-07-30 DIAGNOSIS — M25511 Pain in right shoulder: Secondary | ICD-10-CM | POA: Insufficient documentation

## 2017-07-30 DIAGNOSIS — Z01818 Encounter for other preprocedural examination: Secondary | ICD-10-CM | POA: Diagnosis not present

## 2017-07-30 DIAGNOSIS — Z7984 Long term (current) use of oral hypoglycemic drugs: Secondary | ICD-10-CM | POA: Diagnosis not present

## 2017-07-30 DIAGNOSIS — E559 Vitamin D deficiency, unspecified: Secondary | ICD-10-CM | POA: Diagnosis not present

## 2017-07-30 HISTORY — DX: Family history of other specified conditions: Z84.89

## 2017-07-30 HISTORY — DX: Bariatric surgery status: Z98.84

## 2017-07-30 LAB — BASIC METABOLIC PANEL
Anion gap: 13 (ref 5–15)
BUN: 13 mg/dL (ref 6–20)
CO2: 21 mmol/L — ABNORMAL LOW (ref 22–32)
Calcium: 9.1 mg/dL (ref 8.9–10.3)
Chloride: 105 mmol/L (ref 101–111)
Creatinine, Ser: 0.97 mg/dL (ref 0.44–1.00)
GFR calc Af Amer: >60 mL/min (ref >60)
GFR calc non Af Amer: 57 mL/min — ABNORMAL LOW (ref >60)
Glucose, Bld: 166 mg/dL — ABNORMAL HIGH (ref 65–99)
Potassium: 3.8 mmol/L (ref 3.5–5.1)
Sodium: 139 mmol/L (ref 135–145)

## 2017-07-30 LAB — GLUCOSE, CAPILLARY: Glucose-Capillary: 166 mg/dL — ABNORMAL HIGH (ref 65–99)

## 2017-07-30 LAB — CBC
HCT: 41.8 % (ref 36.0–46.0)
Hemoglobin: 13.5 g/dL (ref 12.0–15.0)
MCH: 29.6 pg (ref 26.0–34.0)
MCHC: 32.3 g/dL (ref 30.0–36.0)
MCV: 91.7 fL (ref 78.0–100.0)
Platelets: 312 10*3/uL (ref 150–400)
RBC: 4.56 MIL/uL (ref 3.87–5.11)
RDW: 15.2 % (ref 11.5–15.5)
WBC: 11.5 10*3/uL — ABNORMAL HIGH (ref 4.0–10.5)

## 2017-07-30 LAB — SURGICAL PCR SCREEN
MRSA, PCR: NEGATIVE
Staphylococcus aureus: NEGATIVE

## 2017-07-30 LAB — HEMOGLOBIN A1C
Hgb A1c MFr Bld: 7.6 % — ABNORMAL HIGH (ref 4.8–5.6)
Mean Plasma Glucose: 171.42 mg/dL

## 2017-07-30 NOTE — Progress Notes (Addendum)
PCP - Deborra Medina Cardiologist - Dr. Saunders Revel  Chest x-ray - 06/14/17 EKG - 06/19/17 Stress Test - 2018 ECHO - 2018 Cardiac Cath - 2014 -   Sleep Study - 2016 CPAP -  Instructed patient to bring mask and tubing the morning of surgery  Fasting Blood Sugar - 130-140s Checks Blood Sugar ___1__ times a day patient is not consistant with checking her blood sugars   Aspirin Instructions: office instructed to stop 7 days prior to surgery  Anesthesia review: Yes  Patient requests no spinal anesthesia due to her DDD, "stated that no one will be able to give her a spinal" Patient stated that she can not be in the same room as a MRI because of the implant in her bladder  Patient stated that she has anginal chest pain that is related to the mass on her adrenal gland on the left side but this has not occurred in over 6 months  Patient denies shortness of breath, fever, cough and chest pain at PAT appointment   Patient verbalized understanding of instructions that were given to them at the PAT appointment. Patient was also instructed that they will need to review over the PAT instructions again at home before surgery.

## 2017-07-31 ENCOUNTER — Ambulatory Visit: Payer: Self-pay

## 2017-07-31 MED ORDER — DULOXETINE HCL 30 MG PO CPEP: 30.0000 mg | ORAL_CAPSULE | Freq: Two times a day (BID) | ORAL | 1 refills | Status: DC

## 2017-07-31 NOTE — Telephone Encounter (Signed)
Patient has been informed and will bring it to appointment on Friday.

## 2017-07-31 NOTE — Telephone Encounter (Signed)
100 mg is the maximal dose of januvia  Her a1c was under 8.0 by today's labs. She needs to send me a log of her readings so I can adjust her other meds   Lab Results  Component Value Date   HGBA1C 7.6 (H) 07/30/2017

## 2017-07-31 NOTE — Telephone Encounter (Signed)
Please call dr Jannifer Franklin to give him the fax number and phone number to [facility].  She needs this done so that she can get a medication refilled.   The number is [phone]. [facility] med for free through

## 2017-07-31 NOTE — Telephone Encounter (Signed)
Called [facility] and spoke with Apolonio Schneiders. She states we can send med electronically to meds by mail [place], [place]. I was able to locate. Placed pharmacy in patient chart.

## 2017-07-31 NOTE — Telephone Encounter (Signed)
E-scribed rx cymbalta to pt [facility] as requested.

## 2017-07-31 NOTE — Telephone Encounter (Signed)
BS jhas just been running per the patient. No further SX.Please advise.

## 2017-07-31 NOTE — Telephone Encounter (Signed)
Pt reports CBG this AM at 1030 (fasting) 187. She took her Januvia 100mg  as ordered QD and ate breakfast, 2 hours after (1330) CBG 177. Pt states this is high for her. No other symptoms. Her concern is she is scheduled for surgery 08/13/17.Marland Kitchentotal shoulder arthroplasty; questioning if her Januvia should be increased pending surgery.

## 2017-07-31 NOTE — Telephone Encounter (Signed)
Called patient again since no return call. She said she requested PCP office to call us and provide information. Advised we have not received anything.  She receives medicine from [place], [place] [address]. Phone: [phone].Another number to try: [phone].   Advised I will call and try to get a fax number.

## 2017-07-31 NOTE — Telephone Encounter (Signed)
Please advise 

## 2017-08-01 NOTE — Telephone Encounter (Signed)
E-scribed to [facility] yesterday. Called today and spoke with [place]. Verified they received rx. Nothing further needed.

## 2017-08-01 NOTE — Progress Notes (Signed)
Anesthesia Chart Review: Patient is a 72 year old female scheduled for right reverse total shoulder arthroplasty on 08/13/2017 by Dr. [name].  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 BMI is consistent with morbid obesity.  For anesthesia concerns, she requests no spinal anesthesia (would not have for this procedure anyway) due to DDD and has a bladder implant so can not be near MRI.  - PCP is Dr. Deborra Medina. Last visit 06/05/17. - Cardiologist is Dr. Harrell Gave End, first established on 10/16/16. She was referred by cardiologist Dr. Earlyne Iba Khat with [facility]) for consideration of cardiac cath. Patient was noted to have a left BBB (recurrent) in the setting of flu-like symptoms and hypokalemia. She had known intermittent chest tightness with previous negative work-ups. Most recent stress test at [facility] was equivocal. Dr. Humphrey Rolls had wished to do a coronary CT but was unable to obtain adequate IV access, so only calcium score was performed. Following evaluation and review of previous work-ups, Dr. Saunders Revel did not feel her symptoms were ischemic in nature and did not feel that she needed cardiac cath. Continued medical management recommended. (Of note, prior to 2018, patient had seen Dr. Kathlyn Sacramento in 2013 and Dr. Lujean Amel.) Last visit with Murray Hodgkins, NP on 06/19/17 for preoperative evaluation. He wrote, "Preoperative cardiovascular examination: Patient is pending right  shoulder replacement in January.  As above, activity is limited in the setting of chronic deconditioning and currently a right foot fracture.  She does not experience chest pain.  She had always labeled an equivocal stress test earlier this year with report of a small area of reversibility in the inferior wall.  No apparent high risk features.  In the absence of chest pain, she remains medically managed with beta-blocker, calcium channel blocker, aspirin, and statin therapy.  We do not feel that she requires any further cardiovascular evaluation prior to surgery.  She should remain on beta-blocker and statin throughout perioperative period.  She is able to hold aspirin preoperatively with plan to resume it postoperatively." - Pulmonologist is Dr. Baird Lyons. Last visit 06/13/17. - Neurologist is Dr. Lenor Coffin.  Meds include albuterol, amlodipine, aspirin 81 mg (holding 7 days preop), Fioricet, Tessalon, Coreg, diazepam, Benadryl, Lasix, hydralazine, Keppra, Lunesta, Zofran, Lyrica, Requip, Crestor, Januvia, Zanaflex, tramadol..  BP (!) 149/70   Pulse 73   Temp 36.8 C   Resp 20   Ht 5\' 4"  (1.626 m)   Wt 248 lb 1.6 oz (112.5 kg)   SpO2 98%   BMI 42.59 kg/m   EKG 06/19/17: NSR. (Of note, by Dr. Darnelle Bos notes, "EKGs as far back as 12/13/06 have intermittently demonstrated a left bundle branch block."  Cath/PCI:  LHC (07/24/12, [facility]): LMCA normal. LAD, LCx, and RCA with minor luminal irregularities.  LHC (04/15/86, [facility]): Normal LMCA, LAD, LCx, and RCA. LVEF 65%.  Non-Invasive Evaluation(s):  Cardiac CT calcium score (09/03/16, Dr. Humphrey Rolls): Coronary calcium score 1548.  Pharmacologic myocardial perfusion stress test (08/08/16, Dr. Humphrey Rolls): "Equivocal stress test" with small inferior wall reversible defect and hyperdynamic LV contraction (EF 87%).  Transthoracic echocardiogram (08/06/16, Dr. Humphrey Rolls): Technically difficult study with  mildly dilated left ventricle. LVEF was normal with normal  wall motion. EF 66%. Grade 1 diastolic dysfunction. Mildly dilated RV with normal contraction. Mitral annular calcification present with mild MR. Mild PR and TR also present. Normal pulmonary artery pressure. RVSP 29.8 mmHg. Severe left and mild right atrial enlargement.  Pharmacologic myocardial perfusion stress test (08/11/15, [facility]): Normal perfusion without ischemia or scar. LVEF 81%.  Carotid U/S 09/25/16 ([place], scanned under Results Review tab):  LCCA velocity 84.9 cm/s. LICA velocity 597.4 cm/s. LECA velocity 136.0 cm/s. Left VA velocity 99.3 cm/s. RCCA velocity 88.1 cm/s. RICA velocity 106.0 cm/s. RECA velocity 102.0 cm/s. Right VA velocity 68.7 cm/s. Conclusions: Borderline mild disease in left bulb with antegrade flow in vertebrals.  Consider CT of carotids.  CXR 06/13/17: IMPRESSION: Minimal lingular atelectasis.  Spirometry 06/20/14: Assessment/Impression: No evidence of obstructive or restrictive lung disease.  The methacholine challenge test did not reveal any signs of reactive/reversible obstructive airways disease.  Test is negative.  Preoperative labs noted. Cr 0.97. Glucose 166. WBC 11.5. H/H 13.5/41.8. PLT 312. A1c 7.6.  If no acute changes then I anticipate that she can proceed as planned.  George [facility] Short Stay Center/Anesthesiology Phone [phone] 08/01/2017 7:02 PM

## 2017-08-01 NOTE — Telephone Encounter (Signed)
Spoke with receptionist at [facility] and she stated that the rx was sent yesterday at 2:00pm to [facility] by escribe. Gave the receptionist the fax number anyways just in case they were to have something different than we had.

## 2017-08-01 NOTE — Telephone Encounter (Signed)
[facility] fax number [phone]

## 2017-08-02 ENCOUNTER — Ambulatory Visit (INDEPENDENT_AMBULATORY_CARE_PROVIDER_SITE_OTHER): Payer: Medicare Other | Admitting: Internal Medicine

## 2017-08-02 ENCOUNTER — Encounter: Payer: Self-pay | Admitting: Internal Medicine

## 2017-08-02 ENCOUNTER — Telehealth: Payer: Self-pay | Admitting: Internal Medicine

## 2017-08-02 DIAGNOSIS — R296 Repeated falls: Secondary | ICD-10-CM | POA: Diagnosis not present

## 2017-08-02 DIAGNOSIS — E11610 Type 2 diabetes mellitus with diabetic neuropathic arthropathy: Secondary | ICD-10-CM | POA: Diagnosis not present

## 2017-08-02 DIAGNOSIS — B372 Candidiasis of skin and nail: Secondary | ICD-10-CM | POA: Diagnosis not present

## 2017-08-02 DIAGNOSIS — E65 Localized adiposity: Secondary | ICD-10-CM | POA: Diagnosis not present

## 2017-08-02 DIAGNOSIS — E1142 Type 2 diabetes mellitus with diabetic polyneuropathy: Secondary | ICD-10-CM

## 2017-08-02 DIAGNOSIS — Z01818 Encounter for other preprocedural examination: Secondary | ICD-10-CM | POA: Diagnosis not present

## 2017-08-02 MED ORDER — GLIPIZIDE 5 MG PO TABS: 5.0000 mg | ORAL_TABLET | Freq: Two times a day (BID) | ORAL | 3 refills | Status: DC

## 2017-08-02 NOTE — Progress Notes (Signed)
Subjective:  Patient ID: Denise Sharp, female    DOB: 1946-04-26  Age: 72 y.o. 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  HPI Denise Sharp presents for preoperative evaluation for rotator cuff surgery  And other issues.  Her shoulder surgery is scheduled to be done under general anesthesia by Patrina Levering  On  Jan 29th as an outpatient. But she is concerned that she will be unable to care for herlelf at home due ot her concurrent foot problems and fecal incontinence. She is  requesting a short stay at a rehab center for post operative period   Patient is accompanied by her husband .  Until  Last weeky she has been confined to the bed for management of a Charcot foot fracture that was detected after a fall in August  But not appreciated by patient  due  to diabetic neuropathy  Blood sugars have been elevated to 300 on occasions for the last several weeks. ,  She has been living on fast food due to her confined state.  She has taken two doses of an old prescription of glipizide  And notes that it drops her BS readily by 100 pts into near normal range. She does not take insulin   She denies any recent history of chest pain  And has had an EKG within the past month   Has a recurring  Skin infection under her enormous pannus . Husband stopped using nystatin powder and has been using a pharmacy recommended diaper rash cream .  The rash is improving slowly   Paient now wearing a diaper constantly due to fecal incontinence ,  This has aggravated her chronic skin infection .  She was denied a surgical evaluation for pannectomy and is requesting another attempt   Grayland Ormond has been monitoring her IDA and managing her iron  infusions,  Last visit in July no infusion needed.     Lab Results  Component Value Date   CREATININE 0.97 07/30/2017    Lab Results  Component Value Date   HGBA1C 7.6 (H) 07/30/2017     Outpatient Medications Prior to Visit  Medication Sig Dispense Refill  . albuterol (PROVENTIL HFA;VENTOLIN HFA) 108 (90 Base) MCG/ACT inhaler Inhale 2 puffs into the lungs every 8 (eight) hours as needed for wheezing or shortness of breath. 1 Inhaler 0  . amLODipine (NORVASC) 5 MG tablet Take 1 tablet (5 mg total) by mouth daily. 90 tablet 3  . aspirin EC 81 MG tablet Take 1 tablet (81 mg total) by mouth daily. 90 tablet 3  . benzonatate (TESSALON) 200 MG capsule Take 200 mg by mouth 3 (three) times daily as needed for cough.    . butalbital-acetaminophen-caffeine (FIORICET, ESGIC) 50-325-40 MG tablet Take 1 tablet by mouth every 6 (six) hours as needed for headache. 60 tablet 0  . Cholecalciferol (VITAMIN D PO) Take 10,000 Units by mouth every other day.     . diazepam (VALIUM) 10 MG tablet Take 1 tablet (10 mg total) by mouth at bedtime. 1 TABLET DAILY AS NEEDED FOR ANXIETY OR INSOMNIA (Patient taking differently: Take 10 mg by mouth at bedtime. ) 30 tablet 5  . diphenhydrAMINE (BENADRYL) 25 MG tablet Take 25 mg by mouth every 6 (six) hours as needed for  itching.     . DULoxetine (CYMBALTA) 30 MG capsule Take 1 capsule (30 mg total) by mouth 2 (two) times daily. 180 capsule 1  . ergocalciferol (DRISDOL) 50000 units capsule Take 1 capsule (50,000 Units total) by mouth once a week. 4 capsule 2  . furosemide (LASIX) 20 MG tablet Take 20 mg by mouth daily as needed for fluid.    . hydrALAZINE (APRESOLINE) 25 MG tablet Take 25 mg by mouth 3 (three) times daily as needed (if BP > 160).    [name] levETIRAcetam (KEPPRA) 500 MG tablet Take 1 tablet (500 mg total) by mouth 2 (two) times daily. 60 tablet 3  . LUNESTA 2 MG TABS tablet Take 2 mg immediately before bedtime 30 tablet 0  . ondansetron (ZOFRAN) 4 MG tablet  Take 4 mg by mouth every 8 (eight) hours as needed for nausea or vomiting.    . pregabalin (LYRICA) 100 MG capsule Take 1 capsule (100 mg total) by mouth 2 (two) times daily. (Patient taking differently: Take 200 mg by mouth at bedtime. ) 180 capsule 2  . rOPINIRole (REQUIP) 1 MG tablet Take 1 tablet (1 mg total) by mouth at bedtime. 90 tablet 5  . rosuvastatin (CRESTOR) 10 MG tablet Take 1 tablet (10 mg total) by mouth daily. 90 tablet 3  . sitaGLIPtin (JANUVIA) 100 MG tablet Take 1 tablet (100 mg total) by mouth daily. 90 tablet 1  . tizanidine (ZANAFLEX) 2 MG capsule Take 2 mg by mouth 3 (three) times daily as needed for muscle spasms.     . traMADol (ULTRAM) 50 MG tablet Take 1 tablet (50 mg total) by mouth daily as needed. (Patient taking differently: Take 50 mg by mouth daily as needed for moderate pain. ) 30 tablet 5  . Zinc Oxide (BOUDREAUXS BUTT PASTE EX) Apply 1 application topically at bedtime.    [name] ZINC OXIDE EX Apply 1 application topically at bedtime.    . carvedilol (COREG) 3.125 MG tablet Take 1 tablet (3.125 mg total) by mouth 2 (two) times daily. 180 tablet 3   No facility-administered medications prior to visit.     Review of Systems;  Patient denies headache, fevers, malaise, unintentional weight loss, skin rash, eye pain, sinus congestion and sinus pain, sore throat, dysphagia,  hemoptysis , cough, dyspnea, wheezing, chest pain, palpitations, orthopnea, edema, abdominal pain, nausea, melena, diarrhea, constipation, flank pain, dysuria, hematuria, urinary  Frequency, nocturia, numbness, tingling, seizures,  Focal weakness, Loss of consciousness,  Tremor, insomnia, depression, anxiety, and suicidal ideation.      Objective:  BP 128/76 (BP Location: Left Arm, Patient Position: Sitting, Cuff Size: Normal)   Pulse 81   Temp 98.5 F (36.9 C) (Oral)   Resp 15   Ht 5\' 4"  (1.626 m)   Wt 251 lb 3.2 oz (113.9 kg)   SpO2 97%   BMI 43.12 kg/m   BP Readings from Last 3  Encounters:  08/02/17 128/76  07/30/17 (!) 149/70  06/19/17 130/70    Wt Readings from Last 3 Encounters:  08/02/17 251 lb 3.2 oz (113.9 kg)  07/30/17 248 lb 1.6 oz (112.5 kg)  06/19/17 248 lb 8 oz (112.7 kg)    General appearance: alert, cooperative and appears stated age Ears: normal TM's and external ear canals both ears Throat: lips, mucosa, and tongue normal; teeth and gums normal Neck: no adenopathy, no carotid bruit, supple, symmetrical, trachea midline and thyroid not enlarged, symmetric, no tenderness/mass/nodules Back: symmetric, no curvature. ROM normal. No CVA  tenderness. Lungs: clear to auscultation bilaterally Heart: regular rate and rhythm, S1, S2 normal, no murmur, click, rub or gallop Abdomen: soft, non-tender; bowel sounds normal; no masses,  no organomegaly Pulses: 2+ and symmetric Skin: Skin color, texture, turgor normal. No rashes or lesions Lymph nodes: Cervical, supraclavicular, and axillary nodes normal.  Lab Results  Component Value Date   HGBA1C 7.6 (H) 07/30/2017   HGBA1C 8.1 (H) 06/04/2017   HGBA1C 7.4 (H) 03/04/2017    Lab Results  Component Value Date   CREATININE 0.97 07/30/2017   CREATININE 0.80 06/04/2017   CREATININE 0.84 03/04/2017    Lab Results  Component Value Date   WBC 11.5 (H) 07/30/2017   HGB 13.5 07/30/2017   HCT 41.8 07/30/2017   PLT 312 07/30/2017   GLUCOSE 166 (H) 07/30/2017   CHOL 183 06/04/2017   TRIG 195.0 (H) 06/04/2017   HDL 48.50 06/04/2017   LDLDIRECT 111.0 06/16/2015   LDLCALC 96 06/04/2017   ALT 16 06/04/2017   AST 18 06/04/2017   NA 139 07/30/2017   K 3.8 07/30/2017   CL 105 07/30/2017   CREATININE 0.97 07/30/2017   BUN 13 07/30/2017   CO2 21 (L) 07/30/2017   TSH 4.400 06/16/2015   HGBA1C 7.6 (H) 07/30/2017   MICROALBUR 2.8 (H) 06/04/2017    No results found.  Assessment & Plan:   Problem List Items Addressed This Visit    Candidiasis, intertrigo    Her rash under her enormous pannus has  improved but not resolved using diaper dermatitis cream.  Adding nystatin powder bid and advised to place a smallcotton cloth between layers of skin to facilitatei aerobic environment.       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  Resume glipizide bid,  Several alternatives to fast food were discussed with patient today .      Relevant Medications   glipiZIDE (GLUCOTROL) 5 MG tablet   Encounter for preoperative examination for general surgical procedure    She has no history of CKD or untreated CAD.  She does not use insulin .  She does have OSA and Pickwickian syndrome and will require careful monitoring during provision of anaesthesia for ventilatory support but is otherwise cleared for orthopedic surgery       Frequent falls    Secondary to diabetic neuropathy, charcot joints and dizziness.  She is a poor candidate for home rehab and would benefit from short term placement post operatively      Pannus, abdominal    Secondary to morbid obesity.  She has frequent intertrigonal infections due to body habitus and would benefit from a pannectomy         I am having Tia Masker. Ellett start on glipiZIDE. I am also having her maintain her levETIRAcetam, albuterol, rOPINIRole, tizanidine, diphenhydrAMINE, Cholecalciferol (VITAMIN D PO), benzonatate, sitaGLIPtin, butalbital-acetaminophen-caffeine, amLODipine, aspirin EC, rosuvastatin, carvedilol, diazepam, pregabalin, traMADol, ergocalciferol, furosemide, hydrALAZINE, ondansetron, ZINC OXIDE EX, Zinc Oxide (BOUDREAUXS BUTT PASTE EX), DULoxetine, and LUNESTA.  Meds ordered this encounter  Medications  . glipiZIDE (GLUCOTROL) 5 MG tablet    Sig: Take 1 tablet (5 mg total) by mouth 2 (two) times daily before a meal.    Dispense:  60  tablet    Refill:  3    There are no discontinued medications.  Follow-up: No Follow-up on file.   [name], MD

## 2017-08-02 NOTE — Patient Instructions (Addendum)
I agree with adding glipizide 5 mg twice daily before meals    Along with Januvia to control your blood sugars   Resume nystatin powder twice daily for the yeast infection under your belly roll (pannus)   Place a cloth in the belly fold to keep the skin folds  From sticking together    Healthy choice entree's  Would be a better choice than fast food

## 2017-08-02 NOTE — Telephone Encounter (Signed)
Copied from [place] [id number]. Topic: Inquiry >> Aug 02, 2017  5:59 PM Oliver Pila B wrote: Reason for CRM: pt called to get clarification on her last visit pt didn't know if she was supposed to get another tablet medication to get called in on top of the glipizide, contact pt to advise

## 2017-08-03 DIAGNOSIS — E11610 Type 2 diabetes mellitus with diabetic neuropathic arthropathy: Secondary | ICD-10-CM | POA: Insufficient documentation

## 2017-08-03 DIAGNOSIS — B372 Candidiasis of skin and nail: Secondary | ICD-10-CM | POA: Insufficient documentation

## 2017-08-03 DIAGNOSIS — Z01818 Encounter for other preprocedural examination: Secondary | ICD-10-CM | POA: Insufficient documentation

## 2017-08-03 NOTE — Assessment & Plan Note (Signed)
She has no history of CKD or untreated CAD.  She does not use insulin .  She does have OSA and Pickwickian syndrome and will require careful monitoring during provision of anaesthesia for ventilatory support but is otherwise cleared for orthopedic surgery

## 2017-08-03 NOTE — Assessment & Plan Note (Signed)
Secondary to diabetic neuropathy, charcot joints and dizziness.  She is a poor candidate for home rehab and would benefit from short term placement post operatively

## 2017-08-03 NOTE — Assessment & Plan Note (Signed)
Secondary to morbid obesity.  She has frequent intertrigonal infections due to body habitus and would benefit from a pannectomy

## 2017-08-03 NOTE — Assessment & Plan Note (Signed)
Currently uncontrolled due to dietary indiscretions and immobility..  Resume glipizide bid,  Several alternatives to fast food were discussed with patient today .

## 2017-08-03 NOTE — Assessment & Plan Note (Signed)
Her rash under her enormous pannus has improved but not resolved using diaper dermatitis cream.  Adding nystatin powder bid and advised to place a smallcotton cloth between layers of skin to facilitatei aerobic environment.

## 2017-08-05 NOTE — Telephone Encounter (Signed)
Looking at the office note and AVS it doesn't look like anything else was to be added except for the pt to continue taking the glipizide 5mg . Want to make sure this is correct.

## 2017-08-05 NOTE — Telephone Encounter (Signed)
She was advised to resume glipizide TWO TIMES DAILY FOR STARTERS and follow her blood sugars

## 2017-08-07 ENCOUNTER — Telehealth: Payer: Self-pay | Admitting: Internal Medicine

## 2017-08-07 NOTE — Telephone Encounter (Signed)
LMTCB. Please advise pt of Dr. Lupita Dawn medication instruction listed below. PEC may speak with pt.

## 2017-08-07 NOTE — Telephone Encounter (Signed)
Denise Sharp phoned to request [name] for the oral medication, diflucan? She stated that the Dr. Mickey Farber calling it in during her preoperative visit on 08/02/17. She stated the rash has somewhat improved while using the nystatin powder (under the pannus).  Please advise.

## 2017-08-07 NOTE — Telephone Encounter (Signed)
Please advise 

## 2017-08-08 ENCOUNTER — Encounter: Payer: Self-pay | Admitting: Neurology

## 2017-08-08 ENCOUNTER — Ambulatory Visit (INDEPENDENT_AMBULATORY_CARE_PROVIDER_SITE_OTHER): Payer: Medicare Other | Admitting: Neurology

## 2017-08-08 VITALS — BP 138/78 | HR 77 | Ht 64.0 in | Wt 250.0 lb

## 2017-08-08 DIAGNOSIS — R51 Headache: Secondary | ICD-10-CM | POA: Diagnosis not present

## 2017-08-08 DIAGNOSIS — E1142 Type 2 diabetes mellitus with diabetic polyneuropathy: Secondary | ICD-10-CM | POA: Diagnosis not present

## 2017-08-08 DIAGNOSIS — R269 Unspecified abnormalities of gait and mobility: Secondary | ICD-10-CM

## 2017-08-08 DIAGNOSIS — G2581 Restless legs syndrome: Secondary | ICD-10-CM

## 2017-08-08 DIAGNOSIS — G4486 Cervicogenic headache: Secondary | ICD-10-CM

## 2017-08-08 MED ORDER — FLUCONAZOLE 150 MG PO TABS: 150.0000 mg | ORAL_TABLET | Freq: Every day | ORAL | 0 refills | Status: DC

## 2017-08-08 MED ORDER — LEVETIRACETAM 500 MG PO TABS: 500.0000 mg | ORAL_TABLET | Freq: Two times a day (BID) | ORAL | 1 refills | Status: DC

## 2017-08-08 NOTE — Progress Notes (Signed)
Reason for visit: Peripheral neuropathy, gait disturbance, cervicogenic headache  Denise Sharp is an 72 y.o. female  History of present illness:  Denise Sharp is a 72 year old right-handed white female with a history of diabetes, diabetic peripheral neuropathy, morbid obesity, cervical spondylosis associated with cervicogenic headache.  The patient apparently fell around 17 February 2017 while in a restaurant.  She hit her head, she went to the emergency room and a CT scan of the brain was done and was unremarkable.  She apparently did not feel pain in the right foot but she fractured her foot around that time.  She has been treated for this over the last several months, and is just now starting to become ambulatory on the foot.  The patient will be going for right shoulder surgery in the near future.  She will require physical therapy following this.  The patient apparently also fell around Thanksgiving when she sat down in a rolling chair and the chair came out from under her.  The patient has gained benefit with use of Cymbalta with her headaches and neck pain, she is on 30 mg twice daily.  She has been taking Keppra 500 mg twice daily for the neuropathy pain which seems to help, this has allowed her to come off of the Lyrica, she may take Lyrica occasionally in the evening hours but not on a daily basis.  The patient has a cane for ambulation.  She returns to this office for an evaluation.  Past Medical History:  Diagnosis Date  . Abnormality of gait 03/25/2013  . Adrenal mass, left ([place])   . Anemia    iron deficient post  2 unit txfsn 2009, normal endo/colonoscopy by [facility]  . Arthritis   . Atypical chest pain    a. 04/1986 Cath ([facility]): nl cors, EF 65%; b. 07/2012 Cath [facility]): LM nl, LAD/LCX/RCA w/ min irregs; c. 07/2016 [facility]): "Equivocal" w/ small inf wall rev defect and hyperdynamic LV contraction, EF 87%; d. 08/2016 Cardiac CT Ca2+ score [facility]): Ca2+ score 1548.  [name] Cervical spinal  stenosis 1994   due to trauma to back ([employer] accident), has intermittent paralysis and parasthesias  . Cervicogenic headache 03/23/2014  . Depression   . Dizziness    chronic dizziness  . DJD (degenerative joint disease)    a. Chronic R shoulder pain pending R shoulder replacement 07/2017.  [name] Esophageal stenosis March 2011   with transietn outlet obstruction by food, cleared by EGD   . Family history of adverse reaction to anesthesia    daughter PONV  . Gastric bypass status for obesity   . Headache(784.0)   . Hypertension   . IBS (irritable bowel syndrome)   . Left bundle branch block (LBBB)    a. Intermittently present - likely rate related. - patient denies  . Obesity   . Obstructive sleep apnea    using CPAP  . 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 Restless leg syndrome   . Syncope and collapse 03/12/2014  . Type II diabetes mellitus ([place])     Past Surgical History:  Procedure Laterality Date  . ABDOMINAL HYSTERECTOMY    . APPENDECTOMY    . COLONOSCOPY    . DIAPHRAGMATIC HERNIA REPAIR  2015  . ESOPHAGEAL DILATION     multiple  . ESOPHAGOGASTRODUODENOSCOPY    . [place]  . GALLBLADDER SURGERY  resection  . GASTRIC BYPASS    . GASTRIC BYPASS  2000, 2005   Dr. Debroah Loop  .  [facility] IMPLANT PLACEMENT  April 2013   [place]  . JOINT REPLACEMENT  2007   bilateral knee. [name],  [name]  . ROTATOR CUFF REPAIR     right  . [place]  . TOTAL ABDOMINAL HYSTERECTOMY W/ BILATERAL SALPINGOOPHORECTOMY  1974  . UMBILICAL HERNIA REPAIR  Aug 11, 2015    Family History  Problem Relation Age of Onset  . Heart disease Father   . Hypertension Father   . Prostate cancer Father   . Stroke Father   . Osteoporosis Father   . Stroke Mother   . Depression Mother   . Headache Mother   . Heart disease Mother   . Thyroid disease Mother   . Hypertension Mother   . Diabetes Daughter   . Heart disease Daughter   . Hypertension Daughter     . Hypertension Son     Social history:  reports that  has never smoked. she has never used smokeless tobacco. She reports that she does not drink alcohol or use drugs.    Allergies  Allergen Reactions  . Diovan [Valsartan] Other (See Comments)    Unknown  . Erythromycin Other (See Comments)    Severe irritable bowel  . Flagyl [Metronidazole] Other (See Comments)    Severe irritable bowel  . Glucophage [Metformin Hcl] Nausea And Vomiting and Other (See Comments)    Sick  . Xanax [Alprazolam] Other (See Comments)    Unknown  . Amoxicillin Diarrhea and Other (See Comments)    Has patient had a PCN reaction causing immediate rash, facial/tongue/throat swelling, SOB or lightheadedness with hypotension: Yes Has patient had a PCN reaction causing severe rash involving mucus membranes or skin necrosis: No Has patient had a PCN reaction that required hospitalization: No Has patient had a PCN reaction occurring within the last 10 years: Yes If all of the above answers are "NO", then may proceed with Cephalosporin use.   . Biaxin [Clarithromycin] Other (See Comments)    Severe irritable bowel  . Demeclocycline Hives  . Sulfa Antibiotics Rash and Other (See Comments)    As child  . Tetracyclines & Related Hives and Rash    Medications:  Prior to Admission medications   Medication Sig Start Date End Date Taking? Authorizing Provider  albuterol (PROVENTIL HFA;VENTOLIN HFA) 108 (90 Base) MCG/ACT inhaler Inhale 2 puffs into the lungs every 8 (eight) hours as needed for wheezing or shortness of breath. 05/29/16  Yes Burnard Hawthorne, FNP  amLODipine (NORVASC) 5 MG tablet Take 1 tablet (5 mg total) by mouth daily. 03/06/17  Yes End, Harrell Gave, MD  aspirin EC 81 MG tablet Take 1 tablet (81 mg total) by mouth daily. 03/06/17  Yes End, Harrell Gave, MD  benzonatate (TESSALON) 200 MG capsule Take 200 mg by mouth 3 (three) times daily as needed for cough.   Yes [provider]   butalbital-acetaminophen-caffeine (FIORICET, ESGIC) 50-325-40 MG tablet Take 1 tablet by mouth every 6 (six) hours as needed for headache. 03/05/17  Yes [name], MD  Cholecalciferol (VITAMIN D PO) Take 10,000 Units by mouth every other day.    Yes [provider]  diazepam (VALIUM) 10 MG tablet Take 1 tablet (10 mg total) by mouth at bedtime. 1 TABLET DAILY AS NEEDED FOR ANXIETY OR INSOMNIA Patient taking differently: Take 10 mg by mouth at bedtime.  04/23/17  Yes [name], MD  diphenhydrAMINE (BENADRYL) 25 MG tablet Take 25 mg by mouth every 6 (six)  hours as needed for itching.    Yes [provider]  DULoxetine (CYMBALTA) 30 MG capsule Take 1 capsule (30 mg total) by mouth 2 (two) times daily. 07/31/17  Yes Kathrynn Ducking, MD  ergocalciferol (DRISDOL) 50000 units capsule Take 1 capsule (50,000 Units total) by mouth once a week. 06/05/17  Yes [name], MD  furosemide (LASIX) 20 MG tablet Take 20 mg by mouth daily as needed for fluid.   Yes [provider]  glipiZIDE (GLUCOTROL) 5 MG tablet Take 1 tablet (5 mg total) by mouth 2 (two) times daily before a meal. 08/02/17  Yes [name], MD  levETIRAcetam (KEPPRA) 500 MG tablet Take 1 tablet (500 mg total) by mouth 2 (two) times daily. 03/22/16  Yes Kathrynn Ducking, MD  LUNESTA 2 MG TABS tablet Take 2 mg immediately before bedtime 07/23/17  Yes [name], MD  ondansetron (ZOFRAN) 4 MG tablet Take 4 mg by mouth every 8 (eight) hours as needed for nausea or vomiting.   Yes [provider]  pregabalin (LYRICA) 100 MG capsule Take 1 capsule (100 mg total) by mouth 2 (two) times daily. Patient taking differently: Take 200 mg by mouth at bedtime.  04/23/17  Yes [name], MD  rOPINIRole (REQUIP) 1 MG tablet Take 1 tablet (1 mg total) by mouth at bedtime. 08/15/16  Yes [name], MD  rosuvastatin (CRESTOR) 10 MG tablet Take 1 tablet (10 mg total) by mouth daily. 03/06/17  Yes End,  Harrell Gave, MD  sitaGLIPtin (JANUVIA) 100 MG tablet Take 1 tablet (100 mg total) by mouth daily. 02/13/17  Yes [name], MD  tizanidine (ZANAFLEX) 2 MG capsule Take 2 mg by mouth 3 (three) times daily as needed for muscle spasms.    Yes [provider]  traMADol (ULTRAM) 50 MG tablet Take 1 tablet (50 mg total) by mouth daily as needed. Patient taking differently: Take 50 mg by mouth daily as needed for moderate pain.  04/23/17  Yes [name], MD  Zinc Oxide (BOUDREAUXS BUTT PASTE EX) Apply 1 application topically at bedtime.   Yes [provider]  ZINC OXIDE EX Apply 1 application topically at bedtime.   Yes [provider]  carvedilol (COREG) 3.125 MG tablet Take 1 tablet (3.125 mg total) by mouth 2 (two) times daily. 03/06/17 07/22/17  End, Harrell Gave, MD    ROS:  Out of a complete 14 system review of symptoms, the patient complains only of the following symptoms, and all other reviewed systems are negative.  Decreased activity Hearing loss, difficulty swallowing Eye discharge, eye itching, eye redness Shortness of breath, choking Leg swelling Swollen abdomen, abdominal pain, diarrhea, incontinence of the bowels Restless legs, insomnia, sleep apnea, frequent waking, daytime sleepiness Incontinence of the bladder Joint pain, joint swelling, back pain, muscle cramps, walking difficulty Skin wounds Dizziness, headache Anxiety  Blood pressure 138/78, pulse 77, height 5\' 4"  (1.626 m), weight 250 lb (113.4 kg), SpO2 98 %.  Physical Exam  General: The patient is alert and cooperative at the time of the examination.  The patient is markedly obese.  Skin: No significant peripheral edema is noted.   Neurologic Exam  Mental status: The patient is alert and oriented x 3 at the time of the examination. The patient has apparent normal recent and remote memory, with an apparently normal attention span and concentration ability.   Cranial nerves: Facial  symmetry is present. Speech is normal, no aphasia or dysarthria  is noted. Extraocular movements are full. Visual fields are full.  Motor: The patient has good strength in all 4 extremities.  Sensory examination: Soft touch sensation is symmetric on the face, arms, and legs.  Coordination: The patient has good finger-nose-finger and heel-to-shin bilaterally.  Gait and station: The patient has a hobbling gait pattern, 10 gait was not attempted.  Romberg is negative. No drift is seen.  Reflexes: Deep tendon reflexes are symmetric, but are depressed at the ankles, actually somewhat brisk in the arms.   CT head 02/17/17:  IMPRESSION: 1. Left forehead scalp contusion. Soft tissue swelling at the bridge of the nose. However, no nasal bone, face or skull fracture identified. 2. No acute intracranial abnormality and stable non contrast CT appearance of the brain.  * CT scan images were reviewed online. I agree with the written report.    Assessment/Plan:  1.  Diabetic peripheral neuropathy  2.  Gait instability, history of falls  3.  Cervicogenic headache  The patient has gained benefit with the Keppra and the Cymbalta.  She will continue these medications, a prescription for Keppra was given.  She will follow-up in 6 months.  If she desires, the above medications can be increased if needed.  Jill Alexanders MD 08/08/2017 12:16 PM  [facility] [address] [place], [place] [address]  Phone [phone] Fax [phone]

## 2017-08-08 NOTE — Telephone Encounter (Signed)
2 day rx for fluconazole sent to [facility]

## 2017-08-08 NOTE — Telephone Encounter (Signed)
Please advise 

## 2017-08-13 ENCOUNTER — Inpatient Hospital Stay (HOSPITAL_COMMUNITY): Payer: Medicare Other | Admitting: Anesthesiology

## 2017-08-13 ENCOUNTER — Encounter (HOSPITAL_COMMUNITY)
Admission: RE | Disposition: A | Discharge status: Skilled Nursing Facility | Payer: Self-pay | Source: Ambulatory Visit | Attending: Orthopedic Surgery

## 2017-08-13 ENCOUNTER — Encounter (HOSPITAL_COMMUNITY): Payer: Self-pay | Admitting: Anesthesiology

## 2017-08-13 ENCOUNTER — Inpatient Hospital Stay (HOSPITAL_COMMUNITY): Payer: Medicare Other | Admitting: Vascular Surgery

## 2017-08-13 ENCOUNTER — Inpatient Hospital Stay (HOSPITAL_COMMUNITY)
Admission: RE | Admit: 2017-08-13 | Discharge: 2017-08-16 | DRG: 483 | Disposition: A | Discharge status: Skilled Nursing Facility | Payer: Medicare Other | Source: Ambulatory Visit | Attending: Orthopedic Surgery | Admitting: Orthopedic Surgery

## 2017-08-13 ENCOUNTER — Inpatient Hospital Stay (HOSPITAL_COMMUNITY): Payer: Medicare Other

## 2017-08-13 DIAGNOSIS — I10 Essential (primary) hypertension: Secondary | ICD-10-CM | POA: Diagnosis present

## 2017-08-13 DIAGNOSIS — Z888 Allergy status to other drugs, medicaments and biological substances status: Secondary | ICD-10-CM

## 2017-08-13 DIAGNOSIS — Z882 Allergy status to sulfonamides status: Secondary | ICD-10-CM | POA: Diagnosis not present

## 2017-08-13 DIAGNOSIS — Z7401 Bed confinement status: Secondary | ICD-10-CM | POA: Diagnosis not present

## 2017-08-13 DIAGNOSIS — M75101 Unspecified rotator cuff tear or rupture of right shoulder, not specified as traumatic: Secondary | ICD-10-CM | POA: Diagnosis not present

## 2017-08-13 DIAGNOSIS — Z96653 Presence of artificial knee joint, bilateral: Secondary | ICD-10-CM | POA: Diagnosis present

## 2017-08-13 DIAGNOSIS — Z6841 Body Mass Index (BMI) 40.0 and over, adult: Secondary | ICD-10-CM

## 2017-08-13 DIAGNOSIS — K580 Irritable bowel syndrome with diarrhea: Secondary | ICD-10-CM | POA: Diagnosis present

## 2017-08-13 DIAGNOSIS — M12811 Other specific arthropathies, not elsewhere classified, right shoulder: Principal | ICD-10-CM | POA: Diagnosis present

## 2017-08-13 DIAGNOSIS — Z96611 Presence of right artificial shoulder joint: Secondary | ICD-10-CM | POA: Diagnosis not present

## 2017-08-13 DIAGNOSIS — Z7982 Long term (current) use of aspirin: Secondary | ICD-10-CM

## 2017-08-13 DIAGNOSIS — E1142 Type 2 diabetes mellitus with diabetic polyneuropathy: Secondary | ICD-10-CM | POA: Diagnosis present

## 2017-08-13 DIAGNOSIS — Z9884 Bariatric surgery status: Secondary | ICD-10-CM | POA: Diagnosis not present

## 2017-08-13 DIAGNOSIS — S4990XA Unspecified injury of shoulder and upper arm, unspecified arm, initial encounter: Secondary | ICD-10-CM | POA: Diagnosis not present

## 2017-08-13 DIAGNOSIS — Z881 Allergy status to other antibiotic agents status: Secondary | ICD-10-CM

## 2017-08-13 DIAGNOSIS — M810 Age-related osteoporosis without current pathological fracture: Secondary | ICD-10-CM | POA: Diagnosis present

## 2017-08-13 DIAGNOSIS — M19211 Secondary osteoarthritis, right shoulder: Secondary | ICD-10-CM | POA: Diagnosis not present

## 2017-08-13 DIAGNOSIS — G2581 Restless legs syndrome: Secondary | ICD-10-CM | POA: Diagnosis present

## 2017-08-13 DIAGNOSIS — M14679 Charcot's joint, unspecified ankle and foot: Secondary | ICD-10-CM | POA: Diagnosis present

## 2017-08-13 DIAGNOSIS — E11610 Type 2 diabetes mellitus with diabetic neuropathic arthropathy: Secondary | ICD-10-CM | POA: Diagnosis present

## 2017-08-13 DIAGNOSIS — M14611 Charcot's joint, right shoulder: Secondary | ICD-10-CM | POA: Diagnosis not present

## 2017-08-13 DIAGNOSIS — K219 Gastro-esophageal reflux disease without esophagitis: Secondary | ICD-10-CM | POA: Diagnosis present

## 2017-08-13 DIAGNOSIS — Z9989 Dependence on other enabling machines and devices: Secondary | ICD-10-CM

## 2017-08-13 DIAGNOSIS — F419 Anxiety disorder, unspecified: Secondary | ICD-10-CM | POA: Diagnosis present

## 2017-08-13 DIAGNOSIS — M129 Arthropathy, unspecified: Secondary | ICD-10-CM | POA: Diagnosis present

## 2017-08-13 DIAGNOSIS — R1310 Dysphagia, unspecified: Secondary | ICD-10-CM | POA: Diagnosis not present

## 2017-08-13 DIAGNOSIS — M19011 Primary osteoarthritis, right shoulder: Secondary | ICD-10-CM | POA: Diagnosis present

## 2017-08-13 DIAGNOSIS — Z79899 Other long term (current) drug therapy: Secondary | ICD-10-CM | POA: Diagnosis not present

## 2017-08-13 DIAGNOSIS — M19019 Primary osteoarthritis, unspecified shoulder: Secondary | ICD-10-CM | POA: Diagnosis present

## 2017-08-13 DIAGNOSIS — G8911 Acute pain due to trauma: Secondary | ICD-10-CM | POA: Diagnosis not present

## 2017-08-13 DIAGNOSIS — G4733 Obstructive sleep apnea (adult) (pediatric): Secondary | ICD-10-CM | POA: Diagnosis present

## 2017-08-13 DIAGNOSIS — K222 Esophageal obstruction: Secondary | ICD-10-CM | POA: Diagnosis present

## 2017-08-13 DIAGNOSIS — Z471 Aftercare following joint replacement surgery: Secondary | ICD-10-CM | POA: Diagnosis not present

## 2017-08-13 DIAGNOSIS — Z96619 Presence of unspecified artificial shoulder joint: Secondary | ICD-10-CM

## 2017-08-13 DIAGNOSIS — R42 Dizziness and giddiness: Secondary | ICD-10-CM | POA: Diagnosis not present

## 2017-08-13 DIAGNOSIS — Z7984 Long term (current) use of oral hypoglycemic drugs: Secondary | ICD-10-CM

## 2017-08-13 DIAGNOSIS — M25511 Pain in right shoulder: Secondary | ICD-10-CM | POA: Diagnosis not present

## 2017-08-13 DIAGNOSIS — G8918 Other acute postprocedural pain: Secondary | ICD-10-CM | POA: Diagnosis not present

## 2017-08-13 HISTORY — PX: REVERSE SHOULDER ARTHROPLASTY: SHX5054

## 2017-08-13 HISTORY — DX: Other specific arthropathies, not elsewhere classified, right shoulder: M12.811

## 2017-08-13 LAB — GLUCOSE, CAPILLARY
Glucose-Capillary: 144 mg/dL — ABNORMAL HIGH (ref 65–99)
Glucose-Capillary: 106 mg/dL — ABNORMAL HIGH (ref 65–99)
Glucose-Capillary: 389 mg/dL — ABNORMAL HIGH (ref 65–99)

## 2017-08-13 SURGERY — ARTHROPLASTY, SHOULDER, TOTAL, REVERSE
Anesthesia: General | Laterality: Right

## 2017-08-13 MED ORDER — ACETAMINOPHEN 500 MG PO TABS
1000.0000 mg | ORAL_TABLET | Freq: Four times a day (QID) | ORAL | Status: AC
  Administered 2017-08-13 – 2017-08-14 (×3): 1000 mg via ORAL
  Filled 2017-08-13 (×4): qty 2

## 2017-08-13 MED ORDER — ACETAMINOPHEN 650 MG RE SUPP: 650.0000 mg | RECTAL | Status: DC | PRN

## 2017-08-13 MED ORDER — KETOROLAC TROMETHAMINE 15 MG/ML IJ SOLN
7.5000 mg | Freq: Four times a day (QID) | INTRAMUSCULAR | Status: AC
  Administered 2017-08-13 – 2017-08-14 (×4): 7.5 mg via INTRAVENOUS
  Filled 2017-08-13 (×4): qty 1

## 2017-08-13 MED ORDER — POTASSIUM CHLORIDE IN NACL 20-0.45 MEQ/L-% IV SOLN
INTRAVENOUS | Status: DC
  Administered 2017-08-13: 22:00:00 via INTRAVENOUS
  Filled 2017-08-13 (×2): qty 1000

## 2017-08-13 MED ORDER — LACTATED RINGERS IV SOLN
INTRAVENOUS | Status: DC | PRN
  Administered 2017-08-13 (×2): via INTRAVENOUS

## 2017-08-13 MED ORDER — PROPOFOL 10 MG/ML IV BOLUS
INTRAVENOUS | Status: AC
  Filled 2017-08-13: qty 20

## 2017-08-13 MED ORDER — TIZANIDINE HCL 2 MG PO TABS
2.0000 mg | ORAL_TABLET | Freq: Three times a day (TID) | ORAL | Status: DC | PRN
  Administered 2017-08-15: 2 mg via ORAL
  Filled 2017-08-13 (×2): qty 1

## 2017-08-13 MED ORDER — SUGAMMADEX SODIUM 200 MG/2ML IV SOLN
INTRAVENOUS | Status: DC | PRN
  Administered 2017-08-13: 200 mg via INTRAVENOUS

## 2017-08-13 MED ORDER — BUPIVACAINE-EPINEPHRINE (PF) 0.5% -1:200000 IJ SOLN
INTRAMUSCULAR | Status: DC | PRN
  Administered 2017-08-13: 30 mL via PERINEURAL

## 2017-08-13 MED ORDER — CEFAZOLIN SODIUM-DEXTROSE 2-4 GM/100ML-% IV SOLN
INTRAVENOUS | Status: AC
  Filled 2017-08-13: qty 100

## 2017-08-13 MED ORDER — DEXAMETHASONE SODIUM PHOSPHATE 10 MG/ML IJ SOLN
INTRAMUSCULAR | Status: DC | PRN
  Administered 2017-08-13: 5 mg via INTRAVENOUS

## 2017-08-13 MED ORDER — DOCUSATE SODIUM 100 MG PO CAPS
100.0000 mg | ORAL_CAPSULE | Freq: Two times a day (BID) | ORAL | Status: DC
  Administered 2017-08-15 – 2017-08-16 (×2): 100 mg via ORAL
  Filled 2017-08-13 (×4): qty 1

## 2017-08-13 MED ORDER — LEVETIRACETAM 500 MG PO TABS
500.0000 mg | ORAL_TABLET | Freq: Two times a day (BID) | ORAL | Status: DC
  Administered 2017-08-14 – 2017-08-16 (×5): 500 mg via ORAL
  Filled 2017-08-13 (×6): qty 1

## 2017-08-13 MED ORDER — CEFAZOLIN SODIUM-DEXTROSE 2-4 GM/100ML-% IV SOLN
2.0000 g | Freq: Once | INTRAVENOUS | Status: AC
  Administered 2017-08-13: 2 g via INTRAVENOUS

## 2017-08-13 MED ORDER — ROCURONIUM BROMIDE 100 MG/10ML IV SOLN
INTRAVENOUS | Status: DC | PRN
  Administered 2017-08-13: 30 mg via INTRAVENOUS

## 2017-08-13 MED ORDER — MIDAZOLAM HCL 2 MG/2ML IJ SOLN
INTRAMUSCULAR | Status: AC
  Administered 2017-08-13: 1 mg via INTRAVENOUS
  Filled 2017-08-13: qty 2

## 2017-08-13 MED ORDER — LACTATED RINGERS IV SOLN: INTRAVENOUS | Status: DC

## 2017-08-13 MED ORDER — PROPOFOL 10 MG/ML IV BOLUS
INTRAVENOUS | Status: DC | PRN
  Administered 2017-08-13: 60 mg via INTRAVENOUS
  Administered 2017-08-13: 140 mg via INTRAVENOUS

## 2017-08-13 MED ORDER — ONDANSETRON HCL 4 MG/2ML IJ SOLN
INTRAMUSCULAR | Status: AC
Start: 2017-08-13 — End: 2017-08-13
  Filled 2017-08-13: qty 2

## 2017-08-13 MED ORDER — FENTANYL CITRATE (PF) 100 MCG/2ML IJ SOLN: 25.0000 ug | INTRAMUSCULAR | Status: DC | PRN

## 2017-08-13 MED ORDER — HYDROCODONE-ACETAMINOPHEN 10-325 MG PO TABS: 1.0000 | ORAL_TABLET | Freq: Four times a day (QID) | ORAL | 0 refills | Status: DC | PRN

## 2017-08-13 MED ORDER — DEXAMETHASONE SODIUM PHOSPHATE 10 MG/ML IJ SOLN
INTRAMUSCULAR | Status: AC
  Filled 2017-08-13: qty 1

## 2017-08-13 MED ORDER — HYDROCODONE-ACETAMINOPHEN 10-325 MG PO TABS
1.0000 | ORAL_TABLET | ORAL | Status: DC | PRN
  Administered 2017-08-14 – 2017-08-16 (×3): 1 via ORAL
  Filled 2017-08-13 (×3): qty 1

## 2017-08-13 MED ORDER — SENNA-DOCUSATE SODIUM 8.6-50 MG PO TABS: 2.0000 | ORAL_TABLET | Freq: Every day | ORAL | 1 refills | Status: DC

## 2017-08-13 MED ORDER — ONDANSETRON HCL 4 MG/2ML IJ SOLN
INTRAMUSCULAR | Status: DC | PRN
  Administered 2017-08-13: 4 mg via INTRAVENOUS

## 2017-08-13 MED ORDER — MIDAZOLAM HCL 2 MG/2ML IJ SOLN
1.0000 mg | Freq: Once | INTRAMUSCULAR | Status: AC
  Administered 2017-08-13: 1 mg via INTRAVENOUS

## 2017-08-13 MED ORDER — METOCLOPRAMIDE HCL 5 MG PO TABS: 5.0000 mg | ORAL_TABLET | Freq: Three times a day (TID) | ORAL | Status: DC | PRN

## 2017-08-13 MED ORDER — DIPHENHYDRAMINE HCL 25 MG PO TABS
25.0000 mg | ORAL_TABLET | Freq: Four times a day (QID) | ORAL | Status: DC | PRN
  Filled 2017-08-13: qty 1

## 2017-08-13 MED ORDER — PROMETHAZINE HCL 25 MG/ML IJ SOLN: 6.2500 mg | INTRAMUSCULAR | Status: DC | PRN

## 2017-08-13 MED ORDER — ZINC OXIDE 12.8 % EX OINT
TOPICAL_OINTMENT | Freq: Every day | CUTANEOUS | Status: DC
  Administered 2017-08-13 – 2017-08-15 (×3): via TOPICAL
  Filled 2017-08-13: qty 56.7

## 2017-08-13 MED ORDER — SUCCINYLCHOLINE CHLORIDE 20 MG/ML IJ SOLN
INTRAMUSCULAR | Status: DC | PRN
  Administered 2017-08-13: 130 mg via INTRAVENOUS

## 2017-08-13 MED ORDER — FENTANYL CITRATE (PF) 100 MCG/2ML IJ SOLN
INTRAMUSCULAR | Status: DC | PRN
  Administered 2017-08-13 (×2): 50 ug via INTRAVENOUS

## 2017-08-13 MED ORDER — SODIUM CHLORIDE 0.9 % IV SOLN
INTRAVENOUS | Status: DC | PRN
  Administered 2017-08-13: 50 ug/min via INTRAVENOUS

## 2017-08-13 MED ORDER — MENTHOL 3 MG MT LOZG: 1.0000 | LOZENGE | OROMUCOSAL | Status: DC | PRN

## 2017-08-13 MED ORDER — SUGAMMADEX SODIUM 200 MG/2ML IV SOLN
INTRAVENOUS | Status: AC
Start: 2017-08-13 — End: 2017-08-13
  Filled 2017-08-13: qty 2

## 2017-08-13 MED ORDER — BUTALBITAL-APAP-CAFFEINE 50-325-40 MG PO TABS: 1.0000 | ORAL_TABLET | Freq: Four times a day (QID) | ORAL | Status: DC | PRN

## 2017-08-13 MED ORDER — 0.9 % SODIUM CHLORIDE (POUR BTL) OPTIME
TOPICAL | Status: DC | PRN
  Administered 2017-08-13: 1000 mL

## 2017-08-13 MED ORDER — ONDANSETRON HCL 4 MG PO TABS: 4.0000 mg | ORAL_TABLET | Freq: Three times a day (TID) | ORAL | Status: DC | PRN

## 2017-08-13 MED ORDER — PHENOL 1.4 % MT LIQD
1.0000 | OROMUCOSAL | Status: DC | PRN
Start: 2017-08-13 — End: 2017-08-16

## 2017-08-13 MED ORDER — PREGABALIN 100 MG PO CAPS
200.0000 mg | ORAL_CAPSULE | Freq: Every day | ORAL | Status: DC
  Administered 2017-08-14 – 2017-08-15 (×2): 200 mg via ORAL
  Filled 2017-08-13 (×3): qty 2

## 2017-08-13 MED ORDER — FENTANYL CITRATE (PF) 100 MCG/2ML IJ SOLN
50.0000 ug | Freq: Once | INTRAMUSCULAR | Status: AC
  Administered 2017-08-13: 50 ug via INTRAVENOUS

## 2017-08-13 MED ORDER — ZOLPIDEM TARTRATE 5 MG PO TABS
5.0000 mg | ORAL_TABLET | Freq: Every evening | ORAL | Status: DC | PRN
  Administered 2017-08-14: 5 mg via ORAL
  Filled 2017-08-13: qty 1

## 2017-08-13 MED ORDER — LACTATED RINGERS IV SOLN
INTRAVENOUS | Status: DC
  Administered 2017-08-13: 12:00:00 via INTRAVENOUS

## 2017-08-13 MED ORDER — LIDOCAINE HCL (CARDIAC) 20 MG/ML IV SOLN
INTRAVENOUS | Status: DC | PRN
  Administered 2017-08-13: 50 mg via INTRAVENOUS

## 2017-08-13 MED ORDER — ACETAMINOPHEN 325 MG PO TABS: 650.0000 mg | ORAL_TABLET | ORAL | Status: DC | PRN

## 2017-08-13 MED ORDER — PHENYLEPHRINE HCL 10 MG/ML IJ SOLN
INTRAMUSCULAR | Status: DC | PRN
  Administered 2017-08-13 (×2): 40 ug via INTRAVENOUS

## 2017-08-13 MED ORDER — DULOXETINE HCL 30 MG PO CPEP
30.0000 mg | ORAL_CAPSULE | Freq: Two times a day (BID) | ORAL | Status: DC
  Administered 2017-08-13 – 2017-08-16 (×6): 30 mg via ORAL
  Filled 2017-08-13 (×6): qty 1

## 2017-08-13 MED ORDER — GLIPIZIDE 5 MG PO TABS
5.0000 mg | ORAL_TABLET | Freq: Two times a day (BID) | ORAL | Status: DC
  Administered 2017-08-14 – 2017-08-16 (×5): 5 mg via ORAL
  Filled 2017-08-13 (×5): qty 1

## 2017-08-13 MED ORDER — EPHEDRINE SULFATE 50 MG/ML IJ SOLN
INTRAMUSCULAR | Status: DC | PRN
  Administered 2017-08-13: 10 mg via INTRAVENOUS

## 2017-08-13 MED ORDER — VITAMIN D 1000 UNITS PO TABS
10000.0000 [IU] | ORAL_TABLET | ORAL | Status: DC
  Administered 2017-08-14: 10000 [IU] via ORAL
  Filled 2017-08-13: qty 10

## 2017-08-13 MED ORDER — FENTANYL CITRATE (PF) 100 MCG/2ML IJ SOLN
INTRAMUSCULAR | Status: AC
  Administered 2017-08-13: 50 ug via INTRAVENOUS
  Filled 2017-08-13: qty 2

## 2017-08-13 MED ORDER — ALUM & MAG HYDROXIDE-SIMETH 200-200-20 MG/5ML PO SUSP: 30.0000 mL | ORAL | Status: DC | PRN

## 2017-08-13 MED ORDER — DIAZEPAM 5 MG PO TABS
10.0000 mg | ORAL_TABLET | Freq: Every day | ORAL | Status: DC
  Administered 2017-08-13 – 2017-08-15 (×3): 10 mg via ORAL
  Filled 2017-08-13 (×3): qty 2

## 2017-08-13 MED ORDER — ONDANSETRON HCL 4 MG/2ML IJ SOLN: 4.0000 mg | Freq: Four times a day (QID) | INTRAMUSCULAR | Status: DC | PRN

## 2017-08-13 MED ORDER — ASPIRIN EC 81 MG PO TBEC
81.0000 mg | DELAYED_RELEASE_TABLET | Freq: Every day | ORAL | Status: DC
  Administered 2017-08-14 – 2017-08-16 (×3): 81 mg via ORAL
  Filled 2017-08-13 (×3): qty 1

## 2017-08-13 MED ORDER — MAGNESIUM CITRATE PO SOLN: 1.0000 | Freq: Once | ORAL | Status: DC | PRN

## 2017-08-13 MED ORDER — CEFAZOLIN SODIUM-DEXTROSE 1-4 GM/50ML-% IV SOLN
1.0000 g | Freq: Four times a day (QID) | INTRAVENOUS | Status: AC
  Administered 2017-08-13 – 2017-08-14 (×3): 1 g via INTRAVENOUS
  Filled 2017-08-13 (×3): qty 50

## 2017-08-13 MED ORDER — ONDANSETRON HCL 4 MG PO TABS: 4.0000 mg | ORAL_TABLET | Freq: Three times a day (TID) | ORAL | 0 refills | Status: DC | PRN

## 2017-08-13 MED ORDER — POLYETHYLENE GLYCOL 3350 17 G PO PACK: 17.0000 g | PACK | Freq: Every day | ORAL | Status: DC | PRN

## 2017-08-13 MED ORDER — SENNA 8.6 MG PO TABS
1.0000 | ORAL_TABLET | Freq: Two times a day (BID) | ORAL | Status: DC
  Administered 2017-08-15 – 2017-08-16 (×2): 8.6 mg via ORAL
  Filled 2017-08-13 (×4): qty 1

## 2017-08-13 MED ORDER — ZINC OXIDE 40 % EX OINT: TOPICAL_OINTMENT | Freq: Every day | CUTANEOUS | Status: DC

## 2017-08-13 MED ORDER — LINAGLIPTIN 5 MG PO TABS
5.0000 mg | ORAL_TABLET | Freq: Every day | ORAL | Status: DC
  Administered 2017-08-13 – 2017-08-16 (×4): 5 mg via ORAL
  Filled 2017-08-13 (×4): qty 1

## 2017-08-13 MED ORDER — MEPERIDINE HCL 25 MG/ML IJ SOLN: 6.2500 mg | INTRAMUSCULAR | Status: DC | PRN

## 2017-08-13 MED ORDER — BACLOFEN 10 MG PO TABS: 10.0000 mg | ORAL_TABLET | Freq: Three times a day (TID) | ORAL | 0 refills | Status: DC

## 2017-08-13 MED ORDER — METOCLOPRAMIDE HCL 5 MG/ML IJ SOLN: 5.0000 mg | Freq: Three times a day (TID) | INTRAMUSCULAR | Status: DC | PRN

## 2017-08-13 MED ORDER — TRAMADOL HCL 50 MG PO TABS: 50.0000 mg | ORAL_TABLET | Freq: Every day | ORAL | Status: DC | PRN

## 2017-08-13 MED ORDER — BENZONATATE 100 MG PO CAPS: 200.0000 mg | ORAL_CAPSULE | Freq: Three times a day (TID) | ORAL | Status: DC | PRN

## 2017-08-13 MED ORDER — CARVEDILOL 3.125 MG PO TABS
3.1250 mg | ORAL_TABLET | Freq: Two times a day (BID) | ORAL | Status: DC
  Administered 2017-08-13 – 2017-08-16 (×6): 3.125 mg via ORAL
  Filled 2017-08-13 (×6): qty 1

## 2017-08-13 MED ORDER — ROSUVASTATIN CALCIUM 10 MG PO TABS
10.0000 mg | ORAL_TABLET | Freq: Every day | ORAL | Status: DC
  Administered 2017-08-14 – 2017-08-16 (×3): 10 mg via ORAL
  Filled 2017-08-13 (×3): qty 1

## 2017-08-13 MED ORDER — FENTANYL CITRATE (PF) 250 MCG/5ML IJ SOLN
INTRAMUSCULAR | Status: AC
  Filled 2017-08-13: qty 5

## 2017-08-13 MED ORDER — AMLODIPINE BESYLATE 5 MG PO TABS
5.0000 mg | ORAL_TABLET | Freq: Every day | ORAL | Status: DC
Start: 2017-08-14 — End: 2017-08-16
  Administered 2017-08-14 – 2017-08-16 (×3): 5 mg via ORAL
  Filled 2017-08-13 (×3): qty 1

## 2017-08-13 MED ORDER — FLUCONAZOLE 150 MG PO TABS: 150.0000 mg | ORAL_TABLET | Freq: Every day | ORAL | Status: DC

## 2017-08-13 MED ORDER — ONDANSETRON HCL 4 MG PO TABS: 4.0000 mg | ORAL_TABLET | Freq: Four times a day (QID) | ORAL | Status: DC | PRN

## 2017-08-13 MED ORDER — BISACODYL 10 MG RE SUPP: 10.0000 mg | Freq: Every day | RECTAL | Status: DC | PRN

## 2017-08-13 MED ORDER — HYDROCODONE-ACETAMINOPHEN 10-325 MG PO TABS
2.0000 | ORAL_TABLET | ORAL | Status: DC | PRN
  Administered 2017-08-15 (×2): 2 via ORAL
  Filled 2017-08-13 (×2): qty 2

## 2017-08-13 MED ORDER — FUROSEMIDE 20 MG PO TABS: 20.0000 mg | ORAL_TABLET | Freq: Every day | ORAL | Status: DC | PRN

## 2017-08-13 MED ORDER — ZOLPIDEM TARTRATE 5 MG PO TABS: 5.0000 mg | ORAL_TABLET | Freq: Every evening | ORAL | Status: DC | PRN

## 2017-08-13 MED ORDER — ROPINIROLE HCL 1 MG PO TABS
1.0000 mg | ORAL_TABLET | Freq: Every day | ORAL | Status: DC
  Administered 2017-08-13 – 2017-08-15 (×3): 1 mg via ORAL
  Filled 2017-08-13 (×3): qty 1

## 2017-08-13 MED ORDER — HYDROMORPHONE HCL 1 MG/ML IJ SOLN
0.5000 mg | INTRAMUSCULAR | Status: DC | PRN
  Administered 2017-08-14 – 2017-08-16 (×3): 0.5 mg via INTRAVENOUS
  Filled 2017-08-13 (×3): qty 1

## 2017-08-13 MED ORDER — ALBUTEROL SULFATE (2.5 MG/3ML) 0.083% IN NEBU: 3.0000 mL | INHALATION_SOLUTION | Freq: Three times a day (TID) | RESPIRATORY_TRACT | Status: DC | PRN

## 2017-08-13 SURGICAL SUPPLY — 66 items
APL SKNCLS STERI-STRIP NONHPOA (GAUZE/BANDAGES/DRESSINGS) ×1
BENZOIN TINCTURE PRP APPL 2/3 (GAUZE/BANDAGES/DRESSINGS) ×2 IMPLANT
BIT DRILL TWIST 2.7 (BIT) IMPLANT
BLADE SAW SAG 73X25 THK (BLADE) ×1
BLADE SAW SGTL 73X25 THK (BLADE) ×1 IMPLANT
BOOTCOVER CLEANROOM LRG (PROTECTIVE WEAR) ×4 IMPLANT
BOWL SMART MIX CTS (DISPOSABLE) IMPLANT
BRUSH FEMORAL CANAL (MISCELLANEOUS) IMPLANT
CAPT SHLDR REVTOTAL 2 ×1 IMPLANT
CLSR STERI-STRIP ANTIMIC 1/2X4 (GAUZE/BANDAGES/DRESSINGS) ×2 IMPLANT
COVER BACK TABLE 60X90IN (DRAPES) ×2 IMPLANT
COVER SURGICAL LIGHT HANDLE (MISCELLANEOUS) ×2 IMPLANT
DRAPE C-ARM 42X72 X-RAY (DRAPES) IMPLANT
DRAPE ORTHO SPLIT 77X108 STRL (DRAPES) ×4
DRAPE SURG ORHT 6 SPLT 77X108 (DRAPES) ×2 IMPLANT
DRAPE U-SHAPE 47X51 STRL (DRAPES) ×2 IMPLANT
DRSG MEPILEX BORDER 4X8 (GAUZE/BANDAGES/DRESSINGS) ×1 IMPLANT
DURAPREP 26ML APPLICATOR (WOUND CARE) ×2 IMPLANT
ELECT REM PT RETURN 9FT ADLT (ELECTROSURGICAL) ×2
ELECTRODE REM PT RTRN 9FT ADLT (ELECTROSURGICAL) ×1 IMPLANT
EVACUATOR 1/8 PVC DRAIN (DRAIN) IMPLANT
FACESHIELD WRAPAROUND (MASK) IMPLANT
FACESHIELD WRAPAROUND OR TEAM (MASK) IMPLANT
GLOVE BIOGEL PI IND STRL 8 (GLOVE) ×1 IMPLANT
GLOVE BIOGEL PI INDICATOR 8 (GLOVE) ×1
GLOVE BIOGEL PI ORTHO PRO SZ8 (GLOVE) ×1
GLOVE ORTHO TXT STRL SZ7.5 (GLOVE) ×2 IMPLANT
GLOVE PI ORTHO PRO STRL SZ8 (GLOVE) ×1 IMPLANT
GLOVE SURG ORTHO 8.0 STRL STRW (GLOVE) ×4 IMPLANT
GLOVE SURG SS PI 6.5 STRL IVOR (GLOVE) ×1 IMPLANT
GOWN STRL REUS W/ TWL XL LVL3 (GOWN DISPOSABLE) ×1 IMPLANT
GOWN STRL REUS W/TWL 2XL LVL3 (GOWN DISPOSABLE) ×2 IMPLANT
GOWN STRL REUS W/TWL XL LVL3 (GOWN DISPOSABLE) ×2
HANDPIECE INTERPULSE COAX TIP (DISPOSABLE)
HOOD PEEL AWAY FACE SHEILD DIS (HOOD) ×4 IMPLANT
KIT BASIN OR (CUSTOM PROCEDURE TRAY) ×2 IMPLANT
KIT ROOM TURNOVER OR (KITS) ×2 IMPLANT
MANIFOLD NEPTUNE II (INSTRUMENTS) ×2 IMPLANT
NS IRRIG 1000ML POUR BTL (IV SOLUTION) ×2 IMPLANT
PACK SHOULDER (CUSTOM PROCEDURE TRAY) ×2 IMPLANT
PAD ARMBOARD 7.5X6 YLW CONV (MISCELLANEOUS) ×4 IMPLANT
PIN THREADED REVERSE (PIN) IMPLANT
SET HNDPC FAN SPRY TIP SCT (DISPOSABLE) IMPLANT
SLING ARM IMMOBILIZER LRG (SOFTGOODS) ×1 IMPLANT
SLING ARM IMMOBILIZER MED (SOFTGOODS) IMPLANT
SMARTMIX MINI TOWER (MISCELLANEOUS)
SPONGE LAP 18X18 X RAY DECT (DISPOSABLE) ×2 IMPLANT
SUCTION FRAZIER HANDLE 10FR (MISCELLANEOUS) ×1
SUCTION TUBE FRAZIER 10FR DISP (MISCELLANEOUS) ×1 IMPLANT
SUPPORT WRAP ARM LG (MISCELLANEOUS) ×2 IMPLANT
SUT ETHIBOND 2 0 SH (SUTURE)
SUT ETHIBOND 2 0 SH 36X2 (SUTURE) IMPLANT
SUT ETHIBOND 2 OS 4 DA (SUTURE) IMPLANT
SUT ETHIBOND CT1 BRD 2-0 30IN (SUTURE) IMPLANT
SUT FIBERWIRE #2 38 T-5 BLUE (SUTURE)
SUT VIC AB 0 CT1 27 (SUTURE) ×2
SUT VIC AB 0 CT1 27XBRD ANBCTR (SUTURE) IMPLANT
SUT VIC AB 2-0 CT1 27 (SUTURE) ×2
SUT VIC AB 2-0 CT1 TAPERPNT 27 (SUTURE) ×1 IMPLANT
SUT VIC AB 3-0 SH 8-18 (SUTURE) ×4 IMPLANT
SUTURE FIBERWR #2 38 T-5 BLUE (SUTURE) IMPLANT
TOWEL OR 17X24 6PK STRL BLUE (TOWEL DISPOSABLE) ×2 IMPLANT
TOWEL OR 17X26 10 PK STRL BLUE (TOWEL DISPOSABLE) ×2 IMPLANT
TOWER SMARTMIX MINI (MISCELLANEOUS) IMPLANT
TRAY FOLEY W/METER SILVER 16FR (SET/KITS/TRAYS/PACK) IMPLANT
WATER STERILE IRR 1000ML POUR (IV SOLUTION) ×2 IMPLANT

## 2017-08-13 NOTE — H&P (Addendum)
PREOPERATIVE H&P  Chief Complaint: RIGHT SHOULDER rotator cuff arthropathy rotator cuff arthropathy  HPI: Denise Sharp is a 72 y.o. female who presents for preoperative history and physical with a diagnosis of rotator cuff arthropathy RIGHT SHOULDER. Symptoms are rated as moderate to severe, and have been worsening.  This is significantly impairing activities of daily living.  She has elected for surgical management.   She has had a previous rotator cuff repair done 30 years ago, has had a persistent chronic pain, symptoms became severe after an injury this past August.  She has had injections, and has had progressive weakness and loss of daily function.  She is also been nearly bedridden because of a Charcot foot, and has been managed by her podiatrist.  Past Medical History:  Diagnosis Date  . Abnormality of gait 03/25/2013  . Adrenal mass, left ([place])   . Anemia    iron deficient post  2 unit txfsn 2009, normal endo/colonoscopy by [facility]  . Arthritis   . Atypical chest pain    a. 04/1986 Cath ([facility]): nl cors, EF 65%; b. 07/2012 Cath [facility]): LM nl, LAD/LCX/RCA w/ min irregs; c. 07/2016 [facility]): "Equivocal" w/ small inf wall rev defect and hyperdynamic LV contraction, EF 87%; d. 08/2016 Cardiac CT Ca2+ score [facility]): Ca2+ score 1548.  [name] Cervical spinal stenosis 1994   due to trauma to back ([employer] accident), has intermittent paralysis and parasthesias  . Cervicogenic headache 03/23/2014  . Depression   . Dizziness    chronic dizziness  . DJD (degenerative joint disease)    a. Chronic R shoulder pain pending R shoulder replacement 07/2017.  [name] Esophageal stenosis March 2011   with transietn outlet obstruction by food, cleared by EGD   . Family history of adverse reaction to anesthesia    daughter PONV  . Gastric bypass status for obesity   . Headache(784.0)   . Hypertension   . IBS (irritable bowel syndrome)   . Left bundle branch block (LBBB)    a. Intermittently present - likely  rate related. - patient denies  . Obesity   . Obstructive sleep apnea    using CPAP  . 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 Restless leg syndrome   . Syncope and collapse 03/12/2014  . Type II diabetes mellitus ([place])    Past Surgical History:  Procedure Laterality Date  . ABDOMINAL HYSTERECTOMY    . APPENDECTOMY    . COLONOSCOPY    . DIAPHRAGMATIC HERNIA REPAIR  2015  . ESOPHAGEAL DILATION     multiple  . ESOPHAGOGASTRODUODENOSCOPY    . [place]  . GALLBLADDER SURGERY  resection  . GASTRIC BYPASS    . GASTRIC BYPASS  2000, 2005   Dr. Debroah Loop  . [facility] IMPLANT PLACEMENT  April 2013   [place]  . JOINT REPLACEMENT  2007   bilateral knee. [name],  [name]  . ROTATOR CUFF REPAIR     right  . [place]  . TOTAL ABDOMINAL HYSTERECTOMY W/ BILATERAL SALPINGOOPHORECTOMY  1974  . UMBILICAL HERNIA REPAIR  Aug 11, 2015   Social History   Socioeconomic History  . Marital status: Married    Spouse name: [name]   . Number of children: 2  . Years of education: College   . Highest education level: None  Social Needs  . Financial resource strain: None  . Food insecurity - worry: None  . Food insecurity - inability: None  . Transportation needs -  medical: None  . Transportation needs - non-medical: None  Occupational History    Employer: retired  Tobacco Use  . Smoking status: Never Smoker  . Smokeless tobacco: Never Used  Substance and Sexual Activity  . Alcohol use: No  . Drug use: No  . Sexual activity: Not Currently  Other Topics Concern  . None  Social History Narrative   Patient lives at home with husband [name].    Patient has 2 children.    Patient has [employer].    Patient is on disability.    Patient is right handed.    Family History  Problem Relation Age of Onset  . Heart disease Father   . Hypertension Father   . Prostate cancer Father   . Stroke Father   . Osteoporosis Father   . Stroke Mother   .  Depression Mother   . Headache Mother   . Heart disease Mother   . Thyroid disease Mother   . Hypertension Mother   . Diabetes Daughter   . Heart disease Daughter   . Hypertension Daughter   . Hypertension Son    Allergies  Allergen Reactions  . Amoxicillin Diarrhea and Other (See Comments)    .PATIENT HAS HAD A PCN REACTION WITH IMMEDIATE RASH, FACIAL/TONGUE/THROAT SWELLING, SOB, OR LIGHTHEADEDNESS WITH HYPOTENSION:  #  #  #  YES  #  #  #   Has patient had a PCN reaction causing severe rash involving mucus membranes or skin necrosis: No Has patient had a PCN reaction that required hospitalization: No Has patient had a PCN reaction occurring within the last 10 years: #  #  #  YES  #  #  #    . Demeclocycline Hives  . Erythromycin Nausea And Vomiting and Other (See Comments)    Severe irritable bowel  . Flagyl [Metronidazole] Nausea And Vomiting and Other (See Comments)    Severe irritable bowel  . Glucophage [Metformin Hcl] Nausea And Vomiting and Other (See Comments)    "Sick"  . Tetracyclines & Related Hives and Rash  . Biaxin [Clarithromycin] Nausea And Vomiting and Other (See Comments)    Severe irritable bowel  . Diovan [Valsartan]     UNSPECIFIED REACTION   . Xanax [Alprazolam]     UNSPECIFIED REACTION   . Sulfa Antibiotics Rash and Other (See Comments)    As child   Prior to Admission medications   Medication Sig Start Date End Date Taking? Authorizing Provider  albuterol (PROVENTIL HFA;VENTOLIN HFA) 108 (90 Base) MCG/ACT inhaler Inhale 2 puffs into the lungs every 8 (eight) hours as needed for wheezing or shortness of breath. 05/29/16  Yes Burnard Hawthorne, FNP  amLODipine (NORVASC) 5 MG tablet Take 1 tablet (5 mg total) by mouth daily. 03/06/17  Yes End, Harrell Gave, MD  aspirin EC 81 MG tablet Take 1 tablet (81 mg total) by mouth daily. 03/06/17  Yes End, Harrell Gave, MD  benzonatate (TESSALON) 200 MG capsule Take 200 mg by mouth 3 (three) times daily as needed for  cough.   Yes [provider]  butalbital-acetaminophen-caffeine (FIORICET, ESGIC) 50-325-40 MG tablet Take 1 tablet by mouth every 6 (six) hours as needed for headache. 03/05/17  Yes [name], MD  carvedilol (COREG) 3.125 MG tablet Take 1 tablet (3.125 mg total) by mouth 2 (two) times daily. 03/06/17 07/22/17 Yes End, Harrell Gave, MD  Cholecalciferol (VITAMIN D PO) Take 10,000 Units by mouth every other day.    Yes [provider]  diazepam (VALIUM) 10 MG tablet Take 1 tablet (10 mg total) by mouth at bedtime. 1 TABLET DAILY AS NEEDED FOR ANXIETY OR INSOMNIA Patient taking differently: Take 10 mg by mouth at bedtime.  04/23/17  Yes [name], MD  diphenhydrAMINE (BENADRYL) 25 MG tablet Take 25 mg by mouth every 6 (six) hours as needed for itching.    Yes [provider]  DULoxetine (CYMBALTA) 30 MG capsule Take 1 capsule (30 mg total) by mouth 2 (two) times daily. 07/31/17  Yes Kathrynn Ducking, MD  furosemide (LASIX) 20 MG tablet Take 20 mg by mouth daily as needed for fluid.   Yes [provider]  glipiZIDE (GLUCOTROL) 5 MG tablet Take 1 tablet (5 mg total) by mouth 2 (two) times daily before a meal. 08/02/17  Yes [name], MD  levETIRAcetam (KEPPRA) 500 MG tablet Take 1 tablet (500 mg total) by mouth 2 (two) times daily. 08/08/17  Yes Kathrynn Ducking, MD  LUNESTA 2 MG TABS tablet Take 2 mg immediately before bedtime 07/23/17  Yes [name], MD  ondansetron (ZOFRAN) 4 MG tablet Take 4 mg by mouth every 8 (eight) hours as needed for nausea or vomiting.   Yes [provider]  pregabalin (LYRICA) 100 MG capsule Take 1 capsule (100 mg total) by mouth 2 (two) times daily. Patient taking differently: Take 200 mg by mouth at bedtime.  04/23/17  Yes [name], MD  rOPINIRole (REQUIP) 1 MG tablet Take 1 tablet (1 mg total) by mouth at bedtime. 08/15/16  Yes [name], MD  rosuvastatin (CRESTOR) 10 MG tablet Take 1 tablet (10 mg total)  by mouth daily. 03/06/17  Yes End, Harrell Gave, MD  sitaGLIPtin (JANUVIA) 100 MG tablet Take 1 tablet (100 mg total) by mouth daily. 02/13/17  Yes [name], MD  tizanidine (ZANAFLEX) 2 MG capsule Take 2 mg by mouth 3 (three) times daily as needed for muscle spasms.    Yes [provider]  traMADol (ULTRAM) 50 MG tablet Take 1 tablet (50 mg total) by mouth daily as needed. Patient taking differently: Take 50 mg by mouth daily as needed for moderate pain.  04/23/17  Yes [name], MD  Zinc Oxide (BOUDREAUXS BUTT PASTE EX) Apply 1 application topically at bedtime.   Yes [provider]  ZINC OXIDE EX Apply 1 application topically at bedtime.   Yes [provider]  ergocalciferol (DRISDOL) 50000 units capsule Take 1 capsule (50,000 Units total) by mouth once a week. 06/05/17   [name], MD  fluconazole (DIFLUCAN) 150 MG tablet Take 1 tablet (150 mg total) by mouth daily. 08/08/17   [name], MD     Positive ROS: All other systems have been reviewed and were otherwise negative with the exception of those mentioned in the HPI and as above.  Physical Exam: General: Alert, no acute distress Cardiovascular: No pedal edema Respiratory: No cyanosis, no use of accessory musculature GI: No organomegaly, abdomen is soft and non-tender Skin: No lesions in the area of chief complaint Neurologic: Sensation intact distally Psychiatric: Patient is competent for consent with normal mood and affect Lymphatic: No axillary or cervical lymphadenopathy  MUSCULOSKELETAL: Right shoulder active motion 0-140 degrees of external rotation to 20 degrees with weakness with supraspinatus and infraspinatus testing with a positive drop arm sign.  Assessment: Right shoulder rotator cuff arthropathy Multiple coexisting comorbidities including osteoporosis, peripheral neuropathy, morbid obesity, diabetes.   Plan: Plan for Procedure(s):  REVERSE RIGHT SHOULDER  ARTHROPLASTY  The risks benefits and alternatives were discussed with the patient including but not limited to the risks of nonoperative treatment, versus surgical intervention including infection, bleeding, nerve injury,  blood clots, cardiopulmonary complications, morbidity, mortality, among others, and they were willing to proceed.   Severity of Illness: The appropriate patient status for this patient is INPATIENT. Inpatient status is judged to be reasonable and necessary in order to provide the required intensity of service to ensure the patient's safety. The patient's presenting symptoms, physical exam findings, and initial radiographic and laboratory data in the context of their chronic comorbidities is felt to place them at high risk for further clinical deterioration. Furthermore, it is not anticipated that the patient will be medically stable for discharge from the hospital within 2 midnights of admission. The following factors support the patient status of inpatient.   " The patient's presenting symptoms include severe right shoulder pain. " The worrisome physical exam findings include loss of active motion with significant weakness with supraspinatus and infraspinatus testing. " The initial radiographic and laboratory data are worrisome because of high riding right-sided humeral head with the rotator cuff arthropathy. " The chronic co-morbidities include morbid obesity, diabetes, neuropathy, osteoporosis.   * I certify that at the point of admission it is my clinical judgment that the patient will require inpatient hospital care spanning beyond 2 midnights from the point of admission due to high intensity of service, high risk for further deterioration and high frequency of surveillance required.*  Of note I did have a discussion with her regarding her allergy, and apparently she has never had a life-threatening allergy to penicillins, but rather diarrhea associated with this.  We will plan  for Ancef.  Johnny Bridge, MD Cell [phone]   08/13/2017 1:27 PM

## 2017-08-13 NOTE — Anesthesia Procedure Notes (Signed)
Procedure Name: Intubation Date/Time: 08/13/2017 2:37 PM Performed by: Gayland Curry, CRNA Pre-anesthesia Checklist: Patient identified, Emergency Drugs available, Suction available and Patient being monitored Patient Re-evaluated:Patient Re-evaluated prior to induction Oxygen Delivery Method: Circle system utilized Preoxygenation: Pre-oxygenation with 100% oxygen Induction Type: IV induction Laryngoscope Size: Mac and 3 Grade View: Grade I Tube type: Oral Tube size: 7.0 mm Number of attempts: 1 Airway Equipment and Method: Stylet Placement Confirmation: ETT inserted through vocal cords under direct vision,  positive ETCO2 and breath sounds checked- equal and bilateral Secured at: 21 cm Tube secured with: Tape Dental Injury: Teeth and Oropharynx as per pre-operative assessment

## 2017-08-13 NOTE — Progress Notes (Signed)
Patient states that all antibiotics cause diarrhea and GI upset and does not have an anaphylactic reaction to amoxicillin.

## 2017-08-13 NOTE — Op Note (Signed)
08/13/2017  4:05 PM  PATIENT:  Denise Sharp    PRE-OPERATIVE DIAGNOSIS:  RIGHT SHOULDER ROTATOR CUFF ARTHROPATHY  POST-OPERATIVE DIAGNOSIS:  Same  PROCEDURE:  REVERSE RIGHT SHOULDER ARTHROPLASTY  SURGEON:  Johnny Bridge, MD  PHYSICIAN ASSISTANT: Joya Gaskins, OPA-C, present and scrubbed throughout the case, critical for completion in a timely fashion, and for retraction, instrumentation, and closure.  ANESTHESIA:   General with regional block  ESTIMATED BLOOD LOSS: 150 ml  PREOPERATIVE INDICATIONS:  Denise Sharp is a  72 y.o. female with a diagnosis of [place] who failed conservative measures and elected for surgical management.    The risks benefits and alternatives were discussed with the patient preoperatively including but not limited to the risks of infection, bleeding, nerve injury, cardiopulmonary complications, the need for revision surgery, dislocation, brachial plexus palsy, incomplete relief of pain, among others, and the patient was willing to proceed.  OPERATIVE IMPLANTS: Biomet size 11 humeral stem press-fit standard with a 44 mm reverse shoulder arthroplasty tray with a standard liner and a 36 mm glenosphere with a mini baseplate and 4 locking screws and one central nonlocking screw.  OPERATIVE FINDINGS: Advanced rotator cuff arthropathy with deficiency superiorly and posteriorly.  There was still a little bit of infraspinatus attached, although it was fairly deficient higher up.  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  Bone quality was fair.  Subscapularis otherwise able to be repaired although the tissue quality was somewhat poor, although at the completion of the repair it was somewhat bunched up anteriorly, and may cause a clicking, should smooth out.  OPERATIVE PROCEDURE: The patient was brought to the operating room and placed in the supine position. General anesthesia was  administered. IV antibiotics were given. Time out was performed. The upper extremity was prepped and draped in usual sterile fashion. The patient was in a beachchair position. Deltopectoral approach was carried out. The cephalic vein was sacrificed.  The biceps was tenodesed to the pectoralis tendon with #2 Fiberwire. The subscapularis was released off of the bone.   I then performed circumferential releases of the humerus, and then dislocated the head, and then reamed with the reamer to the above named size.  I then applied the jig, and cut the humeral head in 30 of retroversion, and then turned my attention to the glenoid.  Deep retractors were placed, and I resected the labrum, and then placed a guidepin into the center position on the glenoid, with slight inferior inclination. I then reamed over the guidepin, and this created a small metaphyseal cancellus blush inferiorly, removing just the cartilage to the subchondral bone superiorly. The base plate was selected and impacted place, and then I secured it centrally with a nonlocking screw, and I had excellent purchase both inferiorly and superiorly. I placed a short locking screws on anterior and posterior aspects.  I then turned my attention to the glenosphere, and impacted this into place, placing slight inferior offset (set on B).   The glenoid sphere was completely seated, and had engagement of the [facility] taper. I then turned my attention back to the humerus.  I sequentially broached, and then trialed, and was found to restore soft tissue tension, and it had 2 finger tightness. Therefore the above named components were selected. The shoulder felt stable throughout functional motion.  Before I placed the real prosthesis I had also placed a total of 3 #2 FiberWire through drill holes  in the humerus for later subscapularis repair.  I then impacted the real prosthesis into place, as well as the real humeral tray, and reduced the shoulder. The  shoulder had excellent motion, and was stable, and I irrigated the wounds copiously.    I then used these to repair the subscapularis. This came down to bone.  I then irrigated the shoulder copiously once more, repaired the deltopectoral interval with Vicryl followed by subcutaneous Vicryl with Steri-Strips and sterile gauze for the skin. The patient was awakened and returned back in stable and satisfactory condition. There no complications and She tolerated the procedure well.

## 2017-08-13 NOTE — Discharge Instructions (Signed)

## 2017-08-13 NOTE — Anesthesia Procedure Notes (Signed)
Anesthesia Regional Block: Interscalene brachial plexus block   Pre-Anesthetic Checklist: ,, timeout performed, Correct Patient, Correct Site, Correct Laterality, Correct Procedure, Correct Position, site marked, Risks and benefits discussed,  Surgical consent,  Pre-op evaluation,  At surgeon's request and post-op pain management  Laterality: Right  Prep: chloraprep       Needles:  Injection technique: Single-shot  Needle Type: Echogenic Needle     Needle Length: 9cm  Needle Gauge: 21     Additional Needles:   Procedures:,,,, ultrasound used (permanent image in chart),,,,  Narrative:  Start time: 08/13/2017 12:30 PM End time: 08/13/2017 12:40 PM Injection made incrementally with aspirations every 5 mL.  Performed by: Personally  Anesthesiologist: Effie Berkshire, MD  Additional Notes: Patient tolerated the procedure well. Local anesthetic introduced in an incremental fashion under minimal resistance after negative aspirations. No paresthesias were elicited. After completion of the procedure, no acute issues were identified and patient continued to be monitored by RN.

## 2017-08-13 NOTE — Transfer of Care (Signed)
Immediate Anesthesia Transfer of Care Note  Patient: Denise Sharp  Procedure(s) Performed: REVERSE RIGHT SHOULDER ARTHROPLASTY (Right )  Patient Location: PACU  Anesthesia Type:GA combined with regional for post-op pain  Level of Consciousness: awake and alert   Airway & Oxygen Therapy: Patient Spontanous Breathing and Patient connected to nasal cannula oxygen  Post-op Assessment: Report given to RN and Post -op Vital signs reviewed and stable  Post vital signs: Reviewed and stable  Last Vitals:  Vitals:   08/13/17 1250 08/13/17 1641  BP: 128/62 126/79  Pulse: 67 82  Resp: 16 12  Temp:  36.6 C  SpO2: 99% 96%    Last Pain:  Vitals:   08/13/17 1210  TempSrc: Oral         Complications: No apparent anesthesia complications

## 2017-08-13 NOTE — Progress Notes (Signed)
Orthopedic Tech Progress Note Patient Details:  Denise Sharp Sep 04, 1945 [id number] Patient has arm sling on. Patient ID: Denise Sharp, female   DOB: [birth year], 72 y.o.   MRN: [id number]   Braulio Bosch 08/13/2017, 8:32 PM

## 2017-08-13 NOTE — Telephone Encounter (Signed)
Spoke with pt and she stated that she has been taking the glipizide twice daily. She stated that she is getting ready to leave to go have her shoulder surgery today and she will have her husband call us back tomorrow or later this week with her blood sugar readings.

## 2017-08-13 NOTE — Anesthesia Postprocedure Evaluation (Signed)
Anesthesia Post Note  Patient: Denise Sharp  Procedure(s) Performed: REVERSE RIGHT SHOULDER ARTHROPLASTY (Right )     Patient location during evaluation: PACU Anesthesia Type: General Level of consciousness: sedated Pain management: pain level controlled Vital Signs Assessment: post-procedure vital signs reviewed and stable Respiratory status: spontaneous breathing and respiratory function stable Cardiovascular status: stable Postop Assessment: no apparent nausea or vomiting Anesthetic complications: no    Last Vitals:  Vitals:   08/13/17 1815 08/13/17 1830  BP: 117/68 117/65  Pulse: 78 78  Resp: 12 13  Temp:  36.6 C  SpO2:      Last Pain:  Vitals:   08/13/17 1700  TempSrc:   PainSc: 0-No pain                 Emie Sommerfeld DANIEL

## 2017-08-13 NOTE — Anesthesia Preprocedure Evaluation (Addendum)
Anesthesia Evaluation  Patient identified by MRN, date of birth, ID band Patient awake    Reviewed: Allergy & Precautions, NPO status , Patient's Chart, lab work & pertinent test results  Airway Mallampati: II  TM Distance: >3 FB Neck ROM: Full    Dental  (+) Upper Dentures, Partial Lower   Pulmonary sleep apnea and Continuous Positive Airway Pressure Ventilation ,    breath sounds clear to auscultation       Cardiovascular hypertension, Pt. on medications  Rhythm:Regular Rate:Normal     Neuro/Psych  Headaches, PSYCHIATRIC DISORDERS Anxiety Depression  Neuromuscular disease    GI/Hepatic negative GI ROS, Neg liver ROS,   Endo/Other  diabetes, Type 2, Insulin Dependent, Oral Hypoglycemic Agents  Renal/GU negative Renal ROS     Musculoskeletal  (+) Arthritis ,   Abdominal (+) + obese,   Peds  Hematology   Anesthesia Other Findings   Reproductive/Obstetrics                            Anesthesia Physical Anesthesia Plan  ASA: III  Anesthesia Plan: General   Post-op Pain Management: GA combined w/ Regional for post-op pain   Induction: Intravenous  PONV Risk Score and Plan: 4 or greater and Ondansetron, Dexamethasone and Midazolam  Airway Management Planned: Oral ETT  Additional Equipment: None  Intra-op Plan:   Post-operative Plan: Extubation in OR  Informed Consent: I have reviewed the patients History and Physical, chart, labs and discussed the procedure including the risks, benefits and alternatives for the proposed anesthesia with the patient or authorized representative who has indicated his/her understanding and acceptance.   Dental advisory given  Plan Discussed with: CRNA  Anesthesia Plan Comments:         Anesthesia Quick Evaluation

## 2017-08-14 ENCOUNTER — Other Ambulatory Visit: Payer: Self-pay

## 2017-08-14 ENCOUNTER — Encounter (HOSPITAL_COMMUNITY): Payer: Self-pay | Admitting: Orthopedic Surgery

## 2017-08-14 LAB — CBC
HCT: 33.5 % — ABNORMAL LOW (ref 36.0–46.0)
Hemoglobin: 10.6 g/dL — ABNORMAL LOW (ref 12.0–15.0)
MCH: 29.4 pg (ref 26.0–34.0)
MCHC: 31.6 g/dL (ref 30.0–36.0)
MCV: 92.8 fL (ref 78.0–100.0)
Platelets: 276 10*3/uL (ref 150–400)
RBC: 3.61 MIL/uL — ABNORMAL LOW (ref 3.87–5.11)
RDW: 15.1 % (ref 11.5–15.5)
WBC: 13.1 10*3/uL — ABNORMAL HIGH (ref 4.0–10.5)

## 2017-08-14 LAB — BASIC METABOLIC PANEL
Anion gap: 11 (ref 5–15)
BUN: 17 mg/dL (ref 6–20)
CO2: 22 mmol/L (ref 22–32)
Calcium: 8.4 mg/dL — ABNORMAL LOW (ref 8.9–10.3)
Chloride: 105 mmol/L (ref 101–111)
Creatinine, Ser: 1 mg/dL (ref 0.44–1.00)
GFR calc Af Amer: >60 mL/min (ref >60)
GFR calc non Af Amer: 55 mL/min — ABNORMAL LOW (ref >60)
Glucose, Bld: 141 mg/dL — ABNORMAL HIGH (ref 65–99)
Potassium: 4.3 mmol/L (ref 3.5–5.1)
Sodium: 138 mmol/L (ref 135–145)

## 2017-08-14 LAB — GLUCOSE, CAPILLARY
Glucose-Capillary: 128 mg/dL — ABNORMAL HIGH (ref 65–99)
Glucose-Capillary: 110 mg/dL — ABNORMAL HIGH (ref 65–99)
Glucose-Capillary: 133 mg/dL — ABNORMAL HIGH (ref 65–99)

## 2017-08-14 NOTE — Progress Notes (Signed)
OT NOTE  Strongly recommend a toilet schedule with this patient due to frequency and urgency. Pt with hx of large number of voids in a daily time. Pt reports 25 trips is a low average per day.   Recommend with starting with hourly 3n1 transfers and extending to hour and half if patient can tolerate longer breaks.    Jeri Modena   OTR/L Pager: [phone] Office: [phone] .

## 2017-08-14 NOTE — Progress Notes (Signed)
Patient ID: Denise Sharp, female   DOB: 1945-10-24, 72 y.o.   MRN: [id number]     Subjective:  Patient reports pain as mild to moderate.  Patient requesting SNF for care and is unable to mobilize due to previous foot surgery  Objective:   VITALS:   Vitals:   08/13/17 2102 08/14/17 0007 08/14/17 0019 08/14/17 0452  BP: 118/66  (!) 106/59 102/64  Pulse: (!) 108  80 70  Resp: 15  16 18   Temp: (!) 97.5 F (36.4 C)  98.1 F (36.7 C) (!) 97 F (36.1 C)  TempSrc: Oral  Oral Oral  SpO2: 97% 95% 95% 96%  Weight:      Height:        ABD soft Sensation intact distally Dorsiflexion/Plantar flexion intact Incision: dressing C/D/I and no drainage Sling at all times  Lab Results  Component Value Date   WBC 13.1 (H) 08/14/2017   HGB 10.6 (L) 08/14/2017   HCT 33.5 (L) 08/14/2017   MCV 92.8 08/14/2017   PLT 276 08/14/2017   BMET    Component Value Date/Time   NA 138 08/14/2017 0603   NA 143 10/20/2014   NA 141 06/21/2014 1349   K 4.3 08/14/2017 0603   K 4.0 06/21/2014 1349   CL 105 08/14/2017 0603   CL 106 06/21/2014 1349   CO2 22 08/14/2017 0603   CO2 24 06/21/2014 1349   GLUCOSE 141 (H) 08/14/2017 0603   GLUCOSE 174 (H) 06/21/2014 1349   BUN 17 08/14/2017 0603   BUN 11 10/20/2014   BUN 12 06/21/2014 1349   CREATININE 1.00 08/14/2017 0603   CREATININE 0.84 08/13/2014 1710   CALCIUM 8.4 (L) 08/14/2017 0603   CALCIUM 8.7 06/21/2014 1349   GFRNONAA 55 (L) 08/14/2017 0603   GFRNONAA 57 (L) 06/21/2014 1349   GFRNONAA 55 (L) 02/25/2014 2153   GFRNONAA 70 12/13/2011 0919   GFRAA >60 08/14/2017 0603   GFRAA >60 06/21/2014 1349   GFRAA >60 02/25/2014 2153   GFRAA 80 12/13/2011 0919     Assessment/Plan: 1 Day Post-Op   Principal Problem:   Rotator cuff arthropathy, right Active Problems:   Primary localized osteoarthrosis of shoulder   Advance diet Up with therapy Plan for SW consult Sling at all times Patient hs bladder stimulator and bowel  issus Patient hs had previous foot surgery  Unable to mobilize    [name], MD Cell [phone]

## 2017-08-14 NOTE — Evaluation (Signed)
Occupational Therapy Evaluation Patient Details Name: Denise Sharp MRN: [id number] DOB: [birth year] Today's Date: 08/14/2017    History of Present Illness 72 yo female s/p R reverse TSA PMH   Past Medical History:  Diagnosis Date  . Abnormality of gait 03/25/2013  . Adrenal mass, left ([place])   . Anemia    iron deficient post  2 unit txfsn 2009, normal endo/colonoscopy by [facility]  . Arthritis   . Atypical chest pain    a. 04/1986 Cath ([facility]): nl cors, EF 65%; b. 07/2012 Cath [facility]): LM nl, LAD/LCX/RCA w/ min irregs; c. 07/2016 [facility]): "Equivocal" w/ small inf wall rev defect and hyperdynamic LV contraction, EF 87%; d. 08/2016 Cardiac CT Ca2+ score [facility]): Ca2+ score 1548.  [name] Cervical spinal stenosis 1994   due to trauma to back ([employer] accident), has intermittent paralysis and parasthesias  . Cervicogenic headache 03/23/2014  . Depression   . Dizziness    chronic dizziness  . DJD (degenerative joint disease)    a. Chronic R shoulder pain pending R shoulder replacement 07/2017.  [name] Esophageal stenosis March 2011   with transietn outlet obstruction by food, cleared by EGD   . Family history of adverse reaction to anesthesia    daughter PONV  . Gastric bypass status for obesity   . Headache(784.0)   . Hypertension   . IBS (irritable bowel syndrome)   . Left bundle branch block (LBBB)    a. Intermittently present - likely rate related. - patient denies  . Obesity   . Obstructive sleep apnea    using CPAP  . 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 Restless leg syndrome   . Rotator cuff arthropathy, right 08/13/2017  . Syncope and collapse 03/12/2014  . Type II diabetes mellitus ([place])       Clinical Impression   Patient is s/p R reverse TSA surgery resulting in functional limitations due to the deficits listed below (see OT problem list). Pt currently requires total (A) for LB care, noted to have skin break down on R hip area skin fold with cream / pillow case placed this  session. Pt with lob x2 during bed to 3n1 transfer.  Patient will benefit from skilled OT acutely to increase independence and safety with ADLS to allow discharge SNF. Pt will require maximized resources for home if denied SNF. Pt with balance deficits.      Follow Up Recommendations  SNF    Equipment Recommendations  Other (comment)(home safety eval and aide if unable to qualify for SNF)    Recommendations for Other Services PT consult     Precautions / Restrictions Precautions Precautions: Fall Precaution Comments: hx of falls, hx of incontinence and wears brief Required Braces or Orthoses: Spinal Brace Restrictions Weight Bearing Restrictions: Yes RUE Weight Bearing: Non weight bearing      Mobility Bed Mobility Overal bed mobility: Needs Assistance Bed Mobility: Supine to Sit     Supine to sit: Mod assist     General bed mobility comments: pt reeaching for bed rail and then therapist to exit bed on the L. pt educated on only exiting toward L to prevent weight bearing on R UE. pt able to scoot to eob with L UE on bed  Transfers Overall transfer level: Needs assistance Equipment used: 1 person hand held assist Transfers: Sit to/from Stand Sit to Stand: Min assist         General transfer comment: pt is able to power up and transfer. pt needed cues  to position in front of 3n1 prior to attempting to doff brief. pt needs to position then doff clothing to decr fall risk with transfers    Balance Overall balance assessment: Needs assistance Sitting-balance support: Single extremity supported Sitting balance-Leahy Scale: Fair       Standing balance-Leahy Scale: Poor Standing balance comment: pt with LOB. pt reports hx of falls and attending a [facility] hospital vestibular evaluation without findings per patient                           ADL either performed or assessed with clinical judgement   ADL Overall ADL's : Needs assistance/impaired Eating/Feeding:  Independent   Grooming: Wash/dry hands;Set up;Sitting   Upper Body Bathing: Moderate assistance   Lower Body Bathing: Maximal assistance           Toilet Transfer: Moderate assistance Toilet Transfer Details (indicate cue type and reason): pt with lob x2 during session. pt requires (A) to pull up briefs.  Toileting- Clothing Manipulation and Hygiene: Total assistance       Functional mobility during ADLs: Minimal assistance General ADL Comments: Pt completed OOB to 3n1 with lob x2 and then transfering to recliner. pt very excited to share information about granddaughter     Vision Baseline Vision/History: Wears glasses Wears Glasses: At all times       Perception     Praxis      Pertinent Vitals/Pain Pain Assessment: 0-10 Pain Score: 4  Pain Location: R shoulder block wearing off at this time Pain Descriptors / Indicators: Operative site guarding Pain Intervention(s): Monitored during session;Premedicated before session;Repositioned     Hand Dominance Right   Extremity/Trunk Assessment Upper Extremity Assessment Upper Extremity Assessment: RUE deficits/detail RUE Deficits / Details: s/p surg   Lower Extremity Assessment Lower Extremity Assessment: Defer to PT evaluation   Cervical / Trunk Assessment Cervical / Trunk Assessment: Other exceptions Cervical / Trunk Exceptions: hx of back surg 1995 and then a stimulator placement at a later date  by [facility] doctor per patient   Communication Communication Communication: No difficulties   Cognition Arousal/Alertness: Awake/alert Behavior During Therapy: WFL for tasks assessed/performed Overall Cognitive Status: Within Functional Limits for tasks assessed                                     General Comments  pt correctly postioned in sling and educated. pt provided shoulder education protocol pt educated on elbow wrist and hand AROM    Exercises     Shoulder Instructions      Home Living  Family/patient expects to be discharged to:: Skilled nursing facility                                        Prior Functioning/Environment Level of Independence: Needs assistance  Gait / Transfers Assistance Needed: walks with cane in R hand with a sway to walk (hx of knee replacement) ADL's / Homemaking Assistance Needed: urgency for voiding            OT Problem List: Decreased range of motion;Decreased activity tolerance;Decreased knowledge of use of DME or AE;Decreased safety awareness;Pain;Impaired balance (sitting and/or standing);Impaired UE functional use;Decreased strength      OT Treatment/Interventions: Self-care/ADL training;Therapeutic activities;Therapeutic exercise;DME and/or AE instruction;Patient/family education;Balance training  OT Goals(Current goals can be found in the care plan section) Acute Rehab OT Goals Patient Stated Goal: to get rehab before home OT Goal Formulation: With patient Time For Goal Achievement: 08/28/17 Potential to Achieve Goals: Good  OT Frequency: Min 2X/week   Barriers to D/C:            Co-evaluation              AM-PAC PT "6 Clicks" Daily Activity     Outcome Measure Help from another person eating meals?: A Little Help from another person taking care of personal grooming?: Total Help from another person toileting, which includes using toliet, bedpan, or urinal?: Total Help from another person bathing (including washing, rinsing, drying)?: Total Help from another person to put on and taking off regular upper body clothing?: Total Help from another person to put on and taking off regular lower body clothing?: Total 6 Click Score: 8   End of Session Nurse Communication: Mobility status;Precautions;Weight bearing status  Activity Tolerance: Patient tolerated treatment well Patient left: in chair;with call bell/phone within reach  OT Visit Diagnosis: Unsteadiness on feet (R26.81);Pain Pain - Right/Left:  Right Pain - part of body: Shoulder                Time: 3361-2244 OT Time Calculation (min): 58 min Charges:  OT General Charges $OT Visit: 1 Visit OT Evaluation $OT Eval Moderate Complexity: 1 Mod OT Treatments $Self Care/Home Management : 38-52 mins G-Codes:      Jeri Modena   OTR/L Pager: [phone] Office: [phone] .   Parke Poisson B 08/14/2017, 10:38 AM

## 2017-08-14 NOTE — Progress Notes (Signed)
Pt stated she could place self on CPAP tonight. RT made pt aware if she needed any help to let nurse know and they will call RT. RT will continue to monitor as needed.

## 2017-08-14 NOTE — Evaluation (Addendum)
Physical Therapy Evaluation Patient Details Name: Denise Sharp MRN: [id number] DOB: [birth year] Today's Date: 08/14/2017   History of Present Illness  72 yo female s/p R reverse TSA. PMH of peripheral neuropathy (B feet completely numb), recent R foot fx, incontinence of bowel/bladder.  Clinical Impression  Pt admitted with above diagnosis. Pt currently with functional limitations due to the deficits listed below (see PT Problem List). Pt ambulated 30' with hemiwalker with min assist for balance, distance limited by fatigue. Pt has had 3-4 falls in the past year and is a high fall risk. She has limited assistance available at home, her spouse has significant health issues. ST-SNF recommended. Pt will benefit from skilled PT to increase their independence and safety with mobility to allow discharge to the venue listed below.       Follow Up Recommendations SNF;Supervision for mobility/OOB    Equipment Recommendations  Hemiwalker   Recommendations for Other Services       Precautions / Restrictions Precautions Precautions: Fall Precaution Comments: hx of 3-4 falls in past 1 year, hx of incontinence and wears brief Required Braces or Orthoses: Sling Restrictions Weight Bearing Restrictions: Yes RUE Weight Bearing: Non weight bearing      Mobility  Bed Mobility Overal bed mobility: Needs Assistance Bed Mobility: Supine to Sit     Supine to sit: Mod assist     General bed mobility comments: up in recliner  Transfers Overall transfer level: Needs assistance Equipment used: Hemi-walker Transfers: Sit to/from Stand Sit to Stand: Min guard         General transfer comment: min/guard for safety/balance  Ambulation/Gait Ambulation/Gait assistance: Min assist Ambulation Distance (Feet): 30 Feet Assistive device: Hemi-walker Gait Pattern/deviations: Step-to pattern;Narrow base of support     General Gait Details: VCs to widen base of support, distance limited by  fatigue, increased lateral trunk shift B (pt reports this is baseline) Min A for balance, loss of balance x 2.   Stairs            Wheelchair Mobility    Modified Rankin (Stroke Patients Only)       Balance Overall balance assessment: Needs assistance Sitting-balance support: Single extremity supported Sitting balance-Leahy Scale: Fair       Standing balance-Leahy Scale: Poor Standing balance comment: pt with LOB. pt reports hx of falls and attending a [facility] hospital vestibular evaluation without findings per patient                             Pertinent Vitals/Pain Pain Assessment: 0-10 Pain Score: 9  Pain Location: R shoulder block wearing off at this time, R hand and elbow painful Pain Descriptors / Indicators: Operative site guarding;Sore Pain Intervention(s): Limited activity within patient's tolerance;Monitored during session;Ice applied(pt wants to wait until after lunch to have pain medication)    [place] expects to be discharged to:: Skilled nursing facility Living Arrangements: Spouse/significant other               Additional Comments: spouse has bladder cancer and agent orange issues, he can offer limited assistance    Prior Function Level of Independence: Needs assistance   Gait / Transfers Assistance Needed: walks with cane in R hand with a sway to walk (hx of knee replacement)  ADL's / Homemaking Assistance Needed: urgency for voiding        Hand Dominance   Dominant Hand: Right    Extremity/Trunk Assessment   Upper  Extremity Assessment Upper Extremity Assessment: Defer to OT evaluation RUE Deficits / Details: s/p surg    Lower Extremity Assessment Lower Extremity Assessment: RLE deficits/detail;LLE deficits/detail RLE Deficits / Details: knee ext +4/5,  ankle DF 4/5, pt reports B feet are numb to light touch RLE Sensation: history of peripheral neuropathy LLE Deficits / Details: knee ext +4/5,  ankle DF  4/5 LLE Sensation: history of peripheral neuropathy    Cervical / Trunk Assessment Cervical / Trunk Assessment: Other exceptions Cervical / Trunk Exceptions: hx of back surg 1995 and then a stimulator placement at a later date  by [facility] doctor per patient  Communication   Communication: No difficulties  Cognition Arousal/Alertness: Awake/alert Behavior During Therapy: WFL for tasks assessed/performed Overall Cognitive Status: Within Functional Limits for tasks assessed                                        General Comments General comments (skin integrity, edema, etc.): pt correctly postioned in sling and educated. pt provided shoulder education protocol pt educated on elbow wrist and hand AROM    Exercises     Assessment/Plan    PT Assessment Patient needs continued PT services  PT Problem List Decreased balance;Decreased activity tolerance;Decreased mobility;Impaired sensation;Pain       PT Treatment Interventions DME instruction;Gait training;Therapeutic activities;Functional mobility training;Balance training;Therapeutic exercise;Patient/family education    PT Goals (Current goals can be found in the Care Plan section)  Acute Rehab PT Goals Patient Stated Goal: to get rehab before home PT Goal Formulation: With patient Time For Goal Achievement: 08/28/17 Potential to Achieve Goals: Good    Frequency Min 3X/week   Barriers to discharge Decreased caregiver support      Co-evaluation               AM-PAC PT "6 Clicks" Daily Activity  Outcome Measure Difficulty turning over in bed (including adjusting bedclothes, sheets and blankets)?: Unable Difficulty moving from lying on back to sitting on the side of the bed? : A Lot Difficulty sitting down on and standing up from a chair with arms (e.g., wheelchair, bedside commode, etc,.)?: A Little Help needed moving to and from a bed to chair (including a wheelchair)?: A Little Help needed walking in  hospital room?: A Little Help needed climbing 3-5 steps with a railing? : A Lot 6 Click Score: 14    End of Session Equipment Utilized During Treatment: Gait belt Activity Tolerance: Patient limited by fatigue;Patient limited by pain Patient left: in chair;with call bell/phone within reach Nurse Communication: Mobility status PT Visit Diagnosis: Unsteadiness on feet (R26.81);History of falling (Z91.81);Difficulty in walking, not elsewhere classified (R26.2);Pain Pain - Right/Left: Right Pain - part of body: Hand    Time: 9038-3338 PT Time Calculation (min) (ACUTE ONLY): 22 min   Charges:   PT Evaluation $PT Eval Moderate Complexity: 1 Mod     PT G Codes:          Philomena Doheny 08/14/2017, 12:08 PM [phone]

## 2017-08-15 LAB — GLUCOSE, CAPILLARY: Glucose-Capillary: 152 mg/dL — ABNORMAL HIGH (ref 65–99)

## 2017-08-15 MED ORDER — PANTOPRAZOLE SODIUM 40 MG IV SOLR
40.0000 mg | Freq: Two times a day (BID) | INTRAVENOUS | Status: DC
  Administered 2017-08-16: 40 mg via INTRAVENOUS
  Filled 2017-08-15 (×2): qty 40

## 2017-08-15 NOTE — Consult Note (Signed)
Reason for Consult: Acute dysphagia Referring Physician: Orthopedics  [name] HPI: This is a 72 year old female admitted for a routine right shoulder surgery.  The surgery was performed without any difficulty and postop she started to complain about dysphagia.  The pain well controlled with the "nerve block", but once the anesthesia wore off she started to have severe pain.  As a result she required pain medications.  Today she tried to eat breakfast, but she was not able to swallow.  Initially she was able to swallow some water to help the food bolus pass through the esophagus, but this did not help later in the day.  Before her surgery she did not have any problems with swallowing.  In the past, approximately 10 years ago, she was dilated multiple times by Drs. Tiffany Kocher and [employer].  Per her report, they both told her that she would not be dilated anymore as she was high risk for perforation.  Her esophageal wall was too "thin".  She does have a history of GERD and she notices more GERD issues when she is in bed, which was the case when she broke her foot in the recent past.  Once she was mobile her GERD resolved.  Past Medical History:  Diagnosis Date  . Abnormality of gait 03/25/2013  . Adrenal mass, left ([place])   . Anemia    iron deficient post  2 unit txfsn 2009, normal endo/colonoscopy by [facility]  . Arthritis   . Atypical chest pain    a. 04/1986 Cath ([facility]): nl cors, EF 65%; b. 07/2012 Cath [facility]): LM nl, LAD/LCX/RCA w/ min irregs; c. 07/2016 [facility]): "Equivocal" w/ small inf wall rev defect and hyperdynamic LV contraction, EF 87%; d. 08/2016 Cardiac CT Ca2+ score [facility]): Ca2+ score 1548.  [name] Cervical spinal stenosis 1994   due to trauma to back ([employer] accident), has intermittent paralysis and parasthesias  . Cervicogenic headache 03/23/2014  . Depression   . Dizziness    chronic dizziness  . DJD (degenerative joint disease)    a. Chronic R shoulder pain pending R shoulder replacement  07/2017.  [name] Esophageal stenosis March 2011   with transietn outlet obstruction by food, cleared by EGD   . Family history of adverse reaction to anesthesia    daughter PONV  . Gastric bypass status for obesity   . Headache(784.0)   . Hypertension   . IBS (irritable bowel syndrome)   . Left bundle branch block (LBBB)    a. Intermittently present - likely rate related. - patient denies  . Obesity   . Obstructive sleep apnea    using CPAP  . 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 Restless leg syndrome   . Rotator cuff arthropathy, right 08/13/2017  . Syncope and collapse 03/12/2014  . Type II diabetes mellitus ([place])     Past Surgical History:  Procedure Laterality Date  . ABDOMINAL HYSTERECTOMY    . APPENDECTOMY    . COLONOSCOPY    . DIAPHRAGMATIC HERNIA REPAIR  2015  . ESOPHAGEAL DILATION     multiple  . ESOPHAGOGASTRODUODENOSCOPY    . [place]  . GALLBLADDER SURGERY  resection  . GASTRIC BYPASS    . GASTRIC BYPASS  2000, 2005   Dr. Debroah Loop  . [facility] IMPLANT PLACEMENT  April 2013   [place]  . JOINT REPLACEMENT  2007   bilateral knee. [name],  [name]  . REVERSE SHOULDER ARTHROPLASTY Right 08/13/2017   Procedure: REVERSE RIGHT SHOULDER ARTHROPLASTY;  Surgeon: [name], MD;  Location: [facility];  Service: Orthopedics;  Laterality: Right;  . ROTATOR CUFF REPAIR     right  . [place]  . TOTAL ABDOMINAL HYSTERECTOMY W/ BILATERAL SALPINGOOPHORECTOMY  1974  . UMBILICAL HERNIA REPAIR  Aug 11, 2015    Family History  Problem Relation Age of Onset  . Heart disease Father   . Hypertension Father   . Prostate cancer Father   . Stroke Father   . Osteoporosis Father   . Stroke Mother   . Depression Mother   . Headache Mother   . Heart disease Mother   . Thyroid disease Mother   . Hypertension Mother   . Diabetes Daughter   . Heart disease Daughter   . Hypertension Daughter   . Hypertension Son     Social History:  reports  that  has never smoked. she has never used smokeless tobacco. She reports that she does not drink alcohol or use drugs.  Allergies:  Allergies  Allergen Reactions  . Amoxicillin Diarrhea and Other (See Comments)    .PATIENT HAS HAD A PCN REACTION WITH IMMEDIATE RASH, FACIAL/TONGUE/THROAT SWELLING, SOB, OR LIGHTHEADEDNESS WITH HYPOTENSION:  #  #  #  YES  #  #  #   Has patient had a PCN reaction causing severe rash involving mucus membranes or skin necrosis: No Has patient had a PCN reaction that required hospitalization: No Has patient had a PCN reaction occurring within the last 10 years: #  #  #  YES  #  #  #  TOLERATED cefazolin pre-op 08/13/17  . Demeclocycline Hives  . Erythromycin Nausea And Vomiting and Other (See Comments)    Severe irritable bowel  . Flagyl [Metronidazole] Nausea And Vomiting and Other (See Comments)    Severe irritable bowel  . Glucophage [Metformin Hcl] Nausea And Vomiting and Other (See Comments)    "Sick"  . Tetracyclines & Related Hives and Rash  . Biaxin [Clarithromycin] Nausea And Vomiting and Other (See Comments)    Severe irritable bowel  . Diovan [Valsartan]     UNSPECIFIED REACTION   . Xanax [Alprazolam]     UNSPECIFIED REACTION   . Sulfa Antibiotics Rash and Other (See Comments)    As child    Medications:  Scheduled: . amLODipine  5 mg Oral Daily  . aspirin EC  81 mg Oral Daily  . carvedilol  3.125 mg Oral BID  . cholecalciferol  10,000 Units Oral QODAY  . diazepam  10 mg Oral QHS  . docusate sodium  100 mg Oral BID  . DULoxetine  30 mg Oral BID  . glipiZIDE  5 mg Oral BID AC  . levETIRAcetam  500 mg Oral BID  . linagliptin  5 mg Oral Daily  . pantoprazole (PROTONIX) IV  40 mg Intravenous Q12H  . pregabalin  200 mg Oral QHS  . rOPINIRole  1 mg Oral QHS  . rosuvastatin  10 mg Oral Daily  . senna  1 tablet Oral BID  . Zinc Oxide   Topical QHS   Continuous: . 0.45 % NaCl with KCl 20 mEq / L 75 mL/hr at 08/13/17 2216    Results for  orders placed or performed during the hospital encounter of 08/13/17 (from the past 24 hour(s))  Glucose, capillary     Status: Abnormal   Collection Time: 08/15/17 11:00 AM  Result Value Ref Range   Glucose-Capillary 152 (H) 65 - 99 mg/dL  No results found.  ROS:  As stated above in the HPI otherwise negative.  Blood pressure 133/62, pulse 84, temperature 98.8 F (37.1 C), temperature source Oral, resp. rate 16, height 5\' 4"  (1.626 m), weight 113.4 kg (250 lb), SpO2 92 %.    PE: Gen: NAD, Alert and Oriented HEENT:  [place]/AT, EOMI Neck: Supple, no LAD Lungs: CTA Bilaterally CV: RRR without M/G/R ABM: Soft, morbidly obese, +BS Ext: No C/C/E  Assessment/Plan: 1) Acute dysphagia. 2) Right shoulder surgery for arthritis. 3) History of a gastric bypass.   I suspect her acute dysphagia is as a result of motility issues.  It is probably secondary to the pain medications, although her history of GERD may also be playing a role.  I am leery about repeating an EGD for her history of dilations and the risk for perforation.  The best course of action is to treat her conservatively.  Plan: 1) Pantoprazole BID. 2) Esophagram. 3) ? Use of a Calcium Channel blocker if the pantoprazole does not help. 4) Minimize pain medications.  Lynnea Vandervoort D 08/15/2017, 5:44 PM

## 2017-08-15 NOTE — Clinical Social Work Note (Signed)
Clinical Social Work Assessment  Patient Details  Name: Denise Sharp MRN: [id number] Date of Birth: 1946-03-15  Date of referral:  08/15/17               Reason for consult:  Facility Placement                Permission sought to share information with:  Facility Art therapist granted to share information::  Yes, Verbal Permission Granted  Name::        Agency::  SNF  Relationship::     Contact Information:     Housing/Transportation Living arrangements for the past 2 months:    Source of Information:    Patient Interpreter Needed:    Criminal Activity/Legal Involvement Pertinent to Current Situation/Hospitalization:    Significant Relationships:  Spouse, Adult Children, Other Family Members Lives with:  Spouse Do you feel safe going back to the place where you live?  No Need for family participation in patient care:  Yes (Comment)  Care giving concerns:  Pt has new impairment and will need SNF at discharge. Pt resides with spouse who assisted her with some of her ADL's as patient has other health issues. Pt desires to return home at discharge from SNF. Pt desires to be near [place] for short term rehab. Pt has experience with SNF. CSW explained SNF process and options. CSW obtained permission to send out to SNF's.  CSW will f/u with patient/spouse on SNf bed offers.  Social Worker assessment / plan:  CSW will f/u for disposition.  Employment status:  Retired Forensic scientist:  [employer] PT Recommendations:  [place] / Referral to community resources:  [place]  Patient/Family's Response to care:  Patient/spouse appreciative of CSW meeting and discussing discharge plans. No other issues identified.  Patient/Family's Understanding of and Emotional Response to Diagnosis, Current Treatment, and Prognosis:  Patient/family has good understanding of diagnosis and impairment. Spouse is unable to care for patient  at home and voiced that with spouse at bedside and they both agreeable to SNF.  Pt agreeable to SNF and will return home once rehab is complete. CSW will follow for discharge.  Emotional Assessment Appearance:  Appears stated age Attitude/Demeanor/Rapport:  (Cooperative) Affect (typically observed):  Accepting, Appropriate Orientation:  Oriented to Situation, Oriented to  Time, Oriented to Place, Oriented to Self Alcohol / Substance use:  Not Applicable Psych involvement (Current and /or in the community):  No (Comment)  Discharge Needs  Concerns to be addressed:  Discharge Planning Concerns Readmission within the last 30 days:  No Current discharge risk:  Physical Impairment, Dependent with Mobility Barriers to Discharge:  No Barriers Identified   Normajean Baxter, LCSW 08/15/2017, 2:24 PM

## 2017-08-15 NOTE — Progress Notes (Signed)
Occupational Therapy Treatment Patient Details Name: Denise Sharp MRN: [id number] DOB: Dec 25, 1945 Today's Date: 08/15/2017    History of present illness 72 yo female s/p R reverse TSA. PMH of peripheral neuropathy (B feet completely numb), recent R foot fx, incontinence of bowel/bladder.   OT comments  Pt progressing towards acute OT goals though limited by significant lethargy this session. Pt and nursing reporting n/v earlier today. Pt completed bed mobility, sat EOB about 8 minutes with min guard to light min A provided. Pt also stood EOB and sidestepped towards HOB min A for LOB on standing. E/w/h ROM to toleration with limited elbow extension. Eyes closed most of session, a little more alert EOB and in standing. Spouse arrived at midpoint of session. Nursing present for part of session. D/c plan to SNF remains appropriate.   Follow Up Recommendations  SNF    Equipment Recommendations  Other (comment)(home safety eval and aide if unable to qualify for SNF)    Recommendations for Other Services PT consult    Precautions / Restrictions Precautions Precautions: Fall;Shoulder Type of Shoulder Precautions: no ROM R shoulder Shoulder Interventions: Shoulder sling/immobilizer;Off for dressing/bathing/exercises;At all times Precaution Comments: hx of 3-4 falls in past 1 year, hx of incontinence and wears brief Required Braces or Orthoses: Sling Restrictions Weight Bearing Restrictions: Yes RUE Weight Bearing: Non weight bearing       Mobility Bed Mobility Overal bed mobility: Needs Assistance Bed Mobility: Supine to Sit;Sit to Supine     Supine to sit: Mod assist Sit to supine: Min assist   General bed mobility comments: Heavy use of rails on left side. Some powerup assist provided behind pt while coming to EOB position.   Transfers Overall transfer level: Needs assistance Equipment used: 1 person hand held assist Transfers: Sit to/from Stand Sit to Stand: Min assist          General transfer comment: LOB on initial stand.     Balance Overall balance assessment: Needs assistance Sitting-balance support: Single extremity supported Sitting balance-Leahy Scale: Poor Sitting balance - Comments: lethargy impacting balance. Occasional truncal support given posteriorly  in sitting position.      Standing balance-Leahy Scale: Poor Standing balance comment: LOB on standing                            ADL either performed or assessed with clinical judgement   ADL Overall ADL's : Needs assistance/impaired                                       General ADL Comments: Pt completed bed mobility and side stepped toward HOB. Lethargy limiting OOB activity this session.      Vision       Perception     Praxis      Cognition Arousal/Alertness: Lethargic Behavior During Therapy: WFL for tasks assessed/performed Overall Cognitive Status: Within Functional Limits for tasks assessed                                 General Comments: very lethargic at start of session, eyes closed and trailing off midsentence. More alert (finishing sentences, eyes more often open) but still lethergic once EOB and in standing. Nursing present and aware.         Exercises Exercises: Other exercises Other  Exercises Other Exercises: e/w/h in supine. Limited active elbow extension.    Shoulder Instructions       General Comments Provided extension piece for waist strap.     Pertinent Vitals/ Pain       Pain Assessment: Faces Faces Pain Scale: Hurts even more Pain Location: R shoulder during bed mobility Pain Descriptors / Indicators: Operative site guarding;Sore Pain Intervention(s): Limited activity within patient's tolerance;Monitored during session;Repositioned  Home Living                                          Prior Functioning/Environment              Frequency  Min 2X/week         Progress Toward Goals  OT Goals(current goals can now be found in the care plan section)  Progress towards OT goals: Progressing toward goals(limited by lethargy this session)  Acute Rehab OT Goals Patient Stated Goal: to get rehab before home OT Goal Formulation: With patient Time For Goal Achievement: 08/28/17 Potential to Achieve Goals: Good ADL Goals Pt Will Transfer to Toilet: with supervision;stand pivot transfer;bedside commode Additional ADL Goal #1: Pt will don doff sling mod I as precursor to adls.  Plan Discharge plan remains appropriate    Co-evaluation                 AM-PAC PT "6 Clicks" Daily Activity     Outcome Measure   Help from another person eating meals?: A Little Help from another person taking care of personal grooming?: Total Help from another person toileting, which includes using toliet, bedpan, or urinal?: Total Help from another person bathing (including washing, rinsing, drying)?: Total Help from another person to put on and taking off regular upper body clothing?: Total Help from another person to put on and taking off regular lower body clothing?: Total 6 Click Score: 8    End of Session Equipment Utilized During Treatment: Other (comment)(sling, +1 HHA)  OT Visit Diagnosis: Unsteadiness on feet (R26.81);Pain Pain - Right/Left: Right Pain - part of body: Shoulder   Activity Tolerance Patient limited by lethargy   Patient Left in bed;with call bell/phone within reach;with nursing/sitter in room;with family/visitor present   Nurse Communication Other (comment)(discussed lethargy )        Time: 3329-5188 OT Time Calculation (min): 29 min  Charges: OT General Charges $OT Visit: 1 Visit OT Treatments $Self Care/Home Management : 8-22 mins     Hortencia Pilar 08/15/2017, 12:37 PM

## 2017-08-15 NOTE — Social Work (Addendum)
Discussed SNF offers with patient and spouse.  Pt accepted SNF bed from SNF [facility]. CSW contacted [facility] and confirmed SNF bed offers.  CSW then received call back from SNF advising that because patient has an open claim with [employer] from august/september 2018, medicare will not pay for skilled nursing until that claim is resolved.  Therefore SNF will not get paid until after claim is resolved, which can take months to years. SNF declined SNF bed offer.  CSW also contacted [facility] to discuss the reason they declined pt.  SNF advised that they cannot accept patient due to an open claim.  CSW will f/u as patient may have to explore other options.  Elissa Hefty, LCSW Clinical Social Worker [phone]

## 2017-08-15 NOTE — Progress Notes (Addendum)
Patient ID: Denise Sharp, female   DOB: 12-12-1945, 72 y.o.   MRN: [id number]     Subjective:  Patient reports pain as mild.  Patient complains for not being able to swallow and having mucus in her esophagus. Denies any CP or SOB  Objective:   VITALS:   Vitals:   08/14/17 1512 08/14/17 2100 08/15/17 0415 08/15/17 1103  BP: (!) 104/45 136/69 (!) 143/74 (!) 128/56  Pulse: 75 90 84 74  Resp: 17 18 17 17   Temp: 97.8 F (36.6 C) 98.6 F (37 C) 98.3 F (36.8 C) 97.9 F (36.6 C)  TempSrc: Oral Oral Oral Oral  SpO2: 98% 96% 96%   Weight:      Height:        ABD soft Sensation intact distally Dorsiflexion/Plantar flexion intact Incision: dressing C/D/I and no drainage Dressing good wound good   Lab Results  Component Value Date   WBC 13.1 (H) 08/14/2017   HGB 10.6 (L) 08/14/2017   HCT 33.5 (L) 08/14/2017   MCV 92.8 08/14/2017   PLT 276 08/14/2017   BMET    Component Value Date/Time   NA 138 08/14/2017 0603   NA 143 10/20/2014   NA 141 06/21/2014 1349   K 4.3 08/14/2017 0603   K 4.0 06/21/2014 1349   CL 105 08/14/2017 0603   CL 106 06/21/2014 1349   CO2 22 08/14/2017 0603   CO2 24 06/21/2014 1349   GLUCOSE 141 (H) 08/14/2017 0603   GLUCOSE 174 (H) 06/21/2014 1349   BUN 17 08/14/2017 0603   BUN 11 10/20/2014   BUN 12 06/21/2014 1349   CREATININE 1.00 08/14/2017 0603   CREATININE 0.84 08/13/2014 1710   CALCIUM 8.4 (L) 08/14/2017 0603   CALCIUM 8.7 06/21/2014 1349   GFRNONAA 55 (L) 08/14/2017 0603   GFRNONAA 57 (L) 06/21/2014 1349   GFRNONAA 55 (L) 02/25/2014 2153   GFRNONAA 70 12/13/2011 0919   GFRAA >60 08/14/2017 0603   GFRAA >60 06/21/2014 1349   GFRAA >60 02/25/2014 2153   GFRAA 80 12/13/2011 0919     Assessment/Plan: 2 Days Post-Op   Principal Problem:   Rotator cuff arthropathy, right Active Problems:   Primary localized osteoarthrosis of shoulder   Advance diet Up with therapy Continue sling at all times NWB right upper ext Dry  dressing PRN Plan for GI consult.   Remonia Richter 08/15/2017, 12:57 PM  Seen and agree with above.  Spoken with GI who will help assess/manage her esophagus/swallowing issues.   [name], MD Cell [phone]

## 2017-08-15 NOTE — Social Work (Signed)
CSW received call back from [facility] advising that they will not be able to take patient.  CSW will f/u with patient on SNF bed offers.  Elissa Hefty, LCSW Clinical Social Worker [phone]

## 2017-08-15 NOTE — NC FL2 (Signed)
[place] LEVEL OF CARE SCREENING TOOL     IDENTIFICATION  Patient Name: Denise Sharp Birthdate: [birth year] Sex: female Admission Date (Current Location): 08/13/2017  [facility] and [place] Number:  Engineering geologist and Address:  [facility], [place] [address], [place], [place] [address]      Provider Number: [id number]  Attending Physician Name and Address:  [name], MD  Relative Name and Phone Number:  Ann Bohne, spouse, [phone]    Current Level of Care: Hospital Recommended Level of Care: [place] Prior Approval Number:    Date Approved/Denied:   PASRR Number: [id number]  Discharge Plan: SNF    Current Diagnoses: Patient Active Problem List   Diagnosis Date Noted  . Rotator cuff arthropathy, right 08/13/2017  . Primary localized osteoarthrosis of shoulder 08/13/2017  . Encounter for preoperative examination for general surgical procedure 08/03/2017  . 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 Candidiasis, intertrigo 08/03/2017  . Acute shoulder pain due to trauma, right 03/05/2017  . 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 Atypical chest pain 10/16/2016  . Dizziness 10/16/2016  . Edema 12/13/2015  . Pain and swelling of right lower leg 12/02/2015  . Hypersomnia, persistent 06/18/2015  . Candida rash of groin 10/31/2014  . Mechanical breakdown of implanted electronic neurostimulator of peripheral nerve ([place]) 10/16/2014  . Acute cystitis without hematuria 10/13/2014  . Obesity hypoventilation syndrome ([place]) 08/13/2014  . Frequent falls 06/23/2014  . Chronic cough 05/05/2014  . Adenoma of left adrenal gland 03/24/2014  . Cervicogenic headache 03/23/2014  . Syncope and collapse 03/12/2014  . Vitamin D deficiency 01/06/2014  . Pannus, abdominal 12/23/2013  . Rectal bleeding 12/03/2013  . Weight gain 12/03/2013  . Obesity,  unspecified 08/25/2013  . Fatigue due to depression 08/04/2013  . Diaphragmatic hernia 04/06/2013  . Diabetic polyneuropathy ([place]) 03/25/2013  . Abnormality of gait 03/25/2013  . Multiple pulmonary nodules 03/20/2013  . Shortness of breath 03/20/2013  . Anemia 08/06/2012  . Iron deficiency anemia 08/06/2012  . Functional disorder of bladder 07/15/2012  . Incomplete bladder emptying 07/15/2012  . 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 Urge incontinence 07/15/2012  . Increased frequency of urination 07/15/2012  . OSA on CPAP 03/02/2012  . Insomnia secondary to anxiety 02/29/2012  . Sciatica 09/05/2011  . Cervical stenosis of spinal canal 06/14/2011  . Restless legs syndrome 03/13/2011  . Degenerative disk disease 03/13/2011  . Hypertension 03/12/2011  . DM type 2 with diabetic peripheral neuropathy ([place]) 03/12/2011  . Hearing loss, right 03/12/2011    Orientation RESPIRATION BLADDER Height & Weight     Self, Time, Situation, Place  Normal Incontinent Weight: 250 lb (113.4 kg) Height:  5\' 4"  (162.6 cm)  BEHAVIORAL SYMPTOMS/MOOD NEUROLOGICAL BOWEL NUTRITION STATUS      Continent Diet(See DC Summary)  AMBULATORY STATUS COMMUNICATION OF NEEDS Skin   Limited Assist Verbally Surgical wounds                       Personal Care Assistance Level of Assistance  Dressing, Feeding, Bathing Bathing Assistance: Maximum assistance Feeding assistance: Limited assistance Dressing Assistance: Maximum assistance     Functional Limitations Info  Sight, Hearing, Speech Sight Info: Adequate Hearing Info: Adequate Speech Info: Adequate    SPECIAL CARE FACTORS FREQUENCY  PT (By licensed PT), OT (By licensed OT)     PT Frequency: 5x week OT Frequency: 5x week  Contractures      Additional Factors Info  Allergies, Code Status Code Status Info: Full Allergies Info: AMOXICILLIN, DEMECLOCYCLINE, ERYTHROMYCIN, FLAGYL METRONIDAZOLE, GLUCOPHAGE METFORMIN HCL,  TETRACYCLINES & RELATED, BIAXIN CLARITHROMYCIN, DIOVAN VALSARTAN, XANAX ALPRAZOLAM, SULFA ANTIBIOTICS            Current Medications (08/15/2017):  This is the current hospital active medication list Current Facility-Administered Medications  Medication Dose Route Frequency Provider Last Rate Last Dose  . 0.45 % NaCl with KCl 20 mEq / L infusion   Intravenous Continuous [name], MD 75 mL/hr at 08/13/17 2216    . acetaminophen (TYLENOL) tablet 650 mg  650 mg Oral Q4H PRN [name], MD       Or  . acetaminophen (TYLENOL) suppository 650 mg  650 mg Rectal Q4H PRN [name], MD      . albuterol (PROVENTIL) (2.5 MG/3ML) 0.083% nebulizer solution 3 mL  3 mL Inhalation Q8H PRN [name], MD      . alum & mag hydroxide-simeth (MAALOX/MYLANTA) 200-200-20 MG/5ML suspension 30 mL  30 mL Oral Q4H PRN [name], MD      . amLODipine (NORVASC) tablet 5 mg  5 mg Oral Daily [name], MD   5 mg at 08/14/17 0945  . aspirin EC tablet 81 mg  81 mg Oral Daily [name], MD   81 mg at 08/14/17 0943  . benzonatate (TESSALON) capsule 200 mg  200 mg Oral TID PRN [name], MD      . bisacodyl (DULCOLAX) suppository 10 mg  10 mg Rectal Daily PRN [name], MD      . carvedilol (COREG) tablet 3.125 mg  3.125 mg Oral BID [name], MD   3.125 mg at 08/14/17 2205  . cholecalciferol (VITAMIN D) tablet 10,000 Units  10,000 Units Oral Lorelee Market, MD   10,000 Units at 08/14/17 0944  . diazepam (VALIUM) tablet 10 mg  10 mg Oral QHS [name], MD   10 mg at 08/14/17 2204  . diphenhydrAMINE (BENADRYL) tablet 25 mg  25 mg Oral Q6H PRN [name], MD      . docusate sodium (COLACE) capsule 100 mg  100 mg Oral BID [name], MD      . DULoxetine (CYMBALTA) DR capsule 30 mg  30 mg Oral BID [name], MD   30 mg at 08/14/17 2204  . furosemide (LASIX) tablet 20 mg  20 mg Oral Daily PRN [name], MD      . glipiZIDE (GLUCOTROL) tablet 5 mg  5 mg  Oral BID AC [name], MD   5 mg at 08/14/17 1837  . HYDROcodone-acetaminophen (NORCO) 10-325 MG per tablet 1 tablet  1 tablet Oral Q4H PRN [name], MD   1 tablet at 08/14/17 2204  . HYDROcodone-acetaminophen (NORCO) 10-325 MG per tablet 2 tablet  2 tablet Oral Q4H PRN [name], MD   2 tablet at 08/15/17 0506  . HYDROmorphone (DILAUDID) injection 0.5 mg  0.5 mg Intravenous Q2H PRN [name], MD   0.5 mg at 08/14/17 1603  . levETIRAcetam (KEPPRA) tablet 500 mg  500 mg Oral BID [name], MD   500 mg at 08/14/17 2204  . linagliptin (TRADJENTA) tablet 5 mg  5 mg Oral Daily [name], MD   5 mg at 08/14/17 0944  . magnesium citrate solution 1 Bottle  1 Bottle Oral Once PRN [name], MD      . menthol-cetylpyridinium (CEPACOL) lozenge 3 mg  1 lozenge  Oral PRN [name], MD       Or  . phenol (CHLORASEPTIC) mouth spray 1 spray  1 spray Mouth/Throat PRN [name], MD      . metoCLOPramide (REGLAN) tablet 5-10 mg  5-10 mg Oral Q8H PRN [name], MD       Or  . metoCLOPramide (REGLAN) injection 5-10 mg  5-10 mg Intravenous Q8H PRN [name], MD      . ondansetron [facility]) tablet 4 mg  4 mg Oral Q6H PRN [name], MD       Or  . ondansetron [facility]) injection 4 mg  4 mg Intravenous Q6H PRN [name], MD      . polyethylene glycol (MIRALAX / GLYCOLAX) packet 17 g  17 g Oral Daily PRN [name], MD      . pregabalin (LYRICA) capsule 200 mg  200 mg Oral QHS [name], MD   200 mg at 08/14/17 2204  . rOPINIRole (REQUIP) tablet 1 mg  1 mg Oral QHS [name], MD   1 mg at 08/14/17 2204  . rosuvastatin (CRESTOR) tablet 10 mg  10 mg Oral Daily [name], MD   10 mg at 08/14/17 0944  . senna (SENOKOT) tablet 8.6 mg  1 tablet Oral BID [name], MD      . tiZANidine (ZANAFLEX) tablet 2 mg  2 mg Oral TID PRN [name], MD      . traMADol Veatrice Bourbon) tablet 50 mg  50 mg Oral Daily PRN [name], MD      . Zinc Oxide  (TRIPLE PASTE) 12.8 % ointment   Topical QHS [name], MD      . zolpidem (AMBIEN) tablet 5 mg  5 mg Oral QHS PRN [name], MD   5 mg at 08/14/17 2304     Discharge Medications: Please see discharge summary for a list of discharge medications.  Relevant Imaging Results:  Relevant Lab Results:   Additional Information SS#: [id number]  Normajean Baxter, LCSW

## 2017-08-16 ENCOUNTER — Inpatient Hospital Stay (HOSPITAL_COMMUNITY): Payer: Medicare Other

## 2017-08-16 DIAGNOSIS — G8911 Acute pain due to trauma: Secondary | ICD-10-CM | POA: Diagnosis not present

## 2017-08-16 DIAGNOSIS — R1310 Dysphagia, unspecified: Secondary | ICD-10-CM | POA: Diagnosis not present

## 2017-08-16 DIAGNOSIS — Z7984 Long term (current) use of oral hypoglycemic drugs: Secondary | ICD-10-CM | POA: Diagnosis not present

## 2017-08-16 DIAGNOSIS — F5105 Insomnia due to other mental disorder: Secondary | ICD-10-CM | POA: Diagnosis present

## 2017-08-16 DIAGNOSIS — G2581 Restless legs syndrome: Secondary | ICD-10-CM | POA: Diagnosis present

## 2017-08-16 DIAGNOSIS — G3184 Mild cognitive impairment, so stated: Secondary | ICD-10-CM | POA: Diagnosis not present

## 2017-08-16 DIAGNOSIS — R400 Somnolence: Secondary | ICD-10-CM | POA: Diagnosis not present

## 2017-08-16 DIAGNOSIS — R404 Transient alteration of awareness: Secondary | ICD-10-CM | POA: Diagnosis not present

## 2017-08-16 DIAGNOSIS — R2681 Unsteadiness on feet: Secondary | ICD-10-CM | POA: Diagnosis not present

## 2017-08-16 DIAGNOSIS — Z471 Aftercare following joint replacement surgery: Secondary | ICD-10-CM | POA: Diagnosis not present

## 2017-08-16 DIAGNOSIS — M14611 Charcot's joint, right shoulder: Secondary | ICD-10-CM | POA: Diagnosis not present

## 2017-08-16 DIAGNOSIS — Z881 Allergy status to other antibiotic agents status: Secondary | ICD-10-CM | POA: Diagnosis not present

## 2017-08-16 DIAGNOSIS — I10 Essential (primary) hypertension: Secondary | ICD-10-CM | POA: Diagnosis not present

## 2017-08-16 DIAGNOSIS — Z7982 Long term (current) use of aspirin: Secondary | ICD-10-CM | POA: Diagnosis not present

## 2017-08-16 DIAGNOSIS — R4020 Unspecified coma: Secondary | ICD-10-CM | POA: Diagnosis not present

## 2017-08-16 DIAGNOSIS — Z882 Allergy status to sulfonamides status: Secondary | ICD-10-CM | POA: Diagnosis not present

## 2017-08-16 DIAGNOSIS — R55 Syncope and collapse: Secondary | ICD-10-CM | POA: Diagnosis not present

## 2017-08-16 DIAGNOSIS — G4700 Insomnia, unspecified: Secondary | ICD-10-CM | POA: Diagnosis not present

## 2017-08-16 DIAGNOSIS — R4182 Altered mental status, unspecified: Secondary | ICD-10-CM | POA: Diagnosis not present

## 2017-08-16 DIAGNOSIS — I959 Hypotension, unspecified: Secondary | ICD-10-CM | POA: Diagnosis present

## 2017-08-16 DIAGNOSIS — Z8249 Family history of ischemic heart disease and other diseases of the circulatory system: Secondary | ICD-10-CM | POA: Diagnosis not present

## 2017-08-16 DIAGNOSIS — E785 Hyperlipidemia, unspecified: Secondary | ICD-10-CM | POA: Diagnosis present

## 2017-08-16 DIAGNOSIS — E118 Type 2 diabetes mellitus with unspecified complications: Secondary | ICD-10-CM | POA: Diagnosis not present

## 2017-08-16 DIAGNOSIS — E11649 Type 2 diabetes mellitus with hypoglycemia without coma: Secondary | ICD-10-CM | POA: Diagnosis present

## 2017-08-16 DIAGNOSIS — Z96653 Presence of artificial knee joint, bilateral: Secondary | ICD-10-CM | POA: Diagnosis present

## 2017-08-16 DIAGNOSIS — M19211 Secondary osteoarthritis, right shoulder: Secondary | ICD-10-CM | POA: Diagnosis not present

## 2017-08-16 DIAGNOSIS — R609 Edema, unspecified: Secondary | ICD-10-CM | POA: Diagnosis not present

## 2017-08-16 DIAGNOSIS — E860 Dehydration: Secondary | ICD-10-CM | POA: Diagnosis present

## 2017-08-16 DIAGNOSIS — R464 Slowness and poor responsiveness: Secondary | ICD-10-CM | POA: Diagnosis not present

## 2017-08-16 DIAGNOSIS — R402441 Other coma, without documented Glasgow coma scale score, or with partial score reported, in the field [EMT or ambulance]: Secondary | ICD-10-CM | POA: Diagnosis not present

## 2017-08-16 DIAGNOSIS — K219 Gastro-esophageal reflux disease without esophagitis: Secondary | ICD-10-CM | POA: Diagnosis not present

## 2017-08-16 DIAGNOSIS — N179 Acute kidney failure, unspecified: Secondary | ICD-10-CM | POA: Diagnosis not present

## 2017-08-16 DIAGNOSIS — M25511 Pain in right shoulder: Secondary | ICD-10-CM | POA: Diagnosis not present

## 2017-08-16 DIAGNOSIS — E1142 Type 2 diabetes mellitus with diabetic polyneuropathy: Secondary | ICD-10-CM | POA: Diagnosis present

## 2017-08-16 DIAGNOSIS — Z9884 Bariatric surgery status: Secondary | ICD-10-CM | POA: Diagnosis not present

## 2017-08-16 DIAGNOSIS — G4733 Obstructive sleep apnea (adult) (pediatric): Secondary | ICD-10-CM | POA: Diagnosis present

## 2017-08-16 DIAGNOSIS — Z90710 Acquired absence of both cervix and uterus: Secondary | ICD-10-CM | POA: Diagnosis not present

## 2017-08-16 DIAGNOSIS — G9341 Metabolic encephalopathy: Secondary | ICD-10-CM | POA: Diagnosis present

## 2017-08-16 DIAGNOSIS — S4990XA Unspecified injury of shoulder and upper arm, unspecified arm, initial encounter: Secondary | ICD-10-CM | POA: Diagnosis not present

## 2017-08-16 DIAGNOSIS — Z79899 Other long term (current) drug therapy: Secondary | ICD-10-CM | POA: Diagnosis not present

## 2017-08-16 DIAGNOSIS — Z880 Allergy status to penicillin: Secondary | ICD-10-CM | POA: Diagnosis not present

## 2017-08-16 DIAGNOSIS — E7800 Pure hypercholesterolemia, unspecified: Secondary | ICD-10-CM | POA: Diagnosis not present

## 2017-08-16 DIAGNOSIS — I2510 Atherosclerotic heart disease of native coronary artery without angina pectoris: Secondary | ICD-10-CM | POA: Diagnosis not present

## 2017-08-16 DIAGNOSIS — F419 Anxiety disorder, unspecified: Secondary | ICD-10-CM | POA: Diagnosis not present

## 2017-08-16 DIAGNOSIS — D500 Iron deficiency anemia secondary to blood loss (chronic): Secondary | ICD-10-CM | POA: Diagnosis not present

## 2017-08-16 DIAGNOSIS — Z96611 Presence of right artificial shoulder joint: Secondary | ICD-10-CM | POA: Diagnosis present

## 2017-08-16 NOTE — Progress Notes (Signed)
Pt discharged to [facility] via stretcher with transporters. Pt's husband following in his car to [facility] to sign paperwork. Pt husband has all of patient's belongings with him. VSS. BP 115/64 (BP Location: Left Arm)   Pulse 85   Temp 98.3 F (36.8 C) (Oral)   Resp 19   Ht 5\' 4"  (1.626 m)   Wt 113.4 kg (250 lb)   SpO2 93%   BMI 42.91 kg/m

## 2017-08-16 NOTE — Progress Notes (Signed)
Physical Therapy Treatment Patient Details Name: Denise Sharp MRN: [id number] DOB: [birth year] Today's Date: 08/16/2017    History of Present Illness 72 yo female s/p R reverse TSA. PMH of peripheral neuropathy (B feet completely numb), recent R foot fx, incontinence of bowel/bladder.    PT Comments    Patient agreeable to OOB to chair. Pt required min A for all mobility. Continue to progress as tolerated with anticipated d/c to SNF for further skilled PT services.     Follow Up Recommendations  SNF;Supervision for mobility/OOB     Equipment Recommendations  None recommended by PT    Recommendations for Other Services       Precautions / Restrictions Precautions Precautions: Fall;Shoulder Type of Shoulder Precautions: no ROM R shoulder Shoulder Interventions: Shoulder sling/immobilizer;Off for dressing/bathing/exercises;At all times Precaution Comments: hx of 3-4 falls in past 1 year, hx of incontinence and wears brief Required Braces or Orthoses: Sling Restrictions Weight Bearing Restrictions: Yes RUE Weight Bearing: Non weight bearing    Mobility  Bed Mobility Overal bed mobility: Needs Assistance Bed Mobility: Supine to Sit     Supine to sit: Min assist     General bed mobility comments: assist to elevate trunk into sitting; cues for sequencing and technique to roll toward L side  Transfers Overall transfer level: Needs assistance Equipment used: 1 person hand held assist Transfers: Sit to/from Stand Sit to Stand: Min assist         General transfer comment: assist to steady upon standing  Ambulation/Gait Ambulation/Gait assistance: Min assist Ambulation Distance (Feet): 4 Feet Assistive device: Hemi-walker Gait Pattern/deviations: Shuffle     General Gait Details: cues for stride length and safe use of AD   Stairs            Wheelchair Mobility    Modified Rankin (Stroke Patients Only)       Balance Overall balance assessment:  Needs assistance Sitting-balance support: Feet supported Sitting balance-Leahy Scale: Fair     Standing balance support: Single extremity supported;During functional activity Standing balance-Leahy Scale: Poor                              Cognition Arousal/Alertness: Awake/alert Behavior During Therapy: WFL for tasks assessed/performed Overall Cognitive Status: Within Functional Limits for tasks assessed                                        Exercises      General Comments        Pertinent Vitals/Pain Pain Assessment: Faces Faces Pain Scale: Hurts little more Pain Location: R shoulder Pain Descriptors / Indicators: Operative site guarding;Sore Pain Intervention(s): Limited activity within patient's tolerance;Monitored during session;Premedicated before session;Repositioned    Home Living                      Prior Function            PT Goals (current goals can now be found in the care plan section) Acute Rehab PT Goals Patient Stated Goal: to get rehab before home PT Goal Formulation: With patient Time For Goal Achievement: 08/28/17 Potential to Achieve Goals: Good Progress towards PT goals: Progressing toward goals    Frequency    Min 3X/week      PT Plan Current plan remains appropriate    Co-evaluation  AM-PAC PT "6 Clicks" Daily Activity  Outcome Measure  Difficulty turning over in bed (including adjusting bedclothes, sheets and blankets)?: Unable Difficulty moving from lying on back to sitting on the side of the bed? : Unable Difficulty sitting down on and standing up from a chair with arms (e.g., wheelchair, bedside commode, etc,.)?: Unable Help needed moving to and from a bed to chair (including a wheelchair)?: A Little Help needed walking in hospital room?: A Little Help needed climbing 3-5 steps with a railing? : A Lot 6 Click Score: 11    End of Session Equipment Utilized During  Treatment: Gait belt Activity Tolerance: Patient tolerated treatment well Patient left: in chair;with call bell/phone within reach;with family/visitor present Nurse Communication: Mobility status PT Visit Diagnosis: Unsteadiness on feet (R26.81);History of falling (Z91.81);Difficulty in walking, not elsewhere classified (R26.2);Pain Pain - Right/Left: Right Pain - part of body: Hand     Time: 1040-1103 PT Time Calculation (min) (ACUTE ONLY): 23 min  Charges:  $Gait Training: 8-22 mins $Therapeutic Activity: 8-22 mins                    G Codes:       Earney Navy, PTA Pager: [phone]     Darliss Cheney 08/16/2017, 5:03 PM

## 2017-08-16 NOTE — Social Work (Signed)
SNF f/u with SNF to confirm it is a private room. [facility] has made bed offer and patient has a bed at SNF however it is not the preferred choice.  CSW will continue to follow.  Elissa Hefty, LCSW Clinical Social Worker [phone]

## 2017-08-16 NOTE — Progress Notes (Signed)
Occupational Therapy Treatment Patient Details Name: Denise Sharp MRN: [id number] DOB: [birth year] Today's Date: 08/16/2017    History of present illness 72 yo female s/p R reverse TSA. PMH of peripheral neuropathy (B feet completely numb), recent R foot fx, incontinence of bowel/bladder.   OT comments  Pt progressing towards acute OT goals. Focus of session was e/w/h ROM exercises and review of shoulder education. Improved elbow extension noted this session. Pt lethargic likely due to pain med. Pt reporting 8/10 pain R shoulder, nursing notified. D/c plan remains appropriate.   Follow Up Recommendations  SNF    Equipment Recommendations  (home safety eval and aide if unable to qualify for SNF)    Recommendations for Other Services      Precautions / Restrictions Precautions Precautions: Fall;Shoulder Type of Shoulder Precautions: no ROM R shoulder Shoulder Interventions: Shoulder sling/immobilizer;Off for dressing/bathing/exercises;At all times Precaution Comments: hx of 3-4 falls in past 1 year, hx of incontinence and wears brief Required Braces or Orthoses: Sling Restrictions Weight Bearing Restrictions: Yes RUE Weight Bearing: Non weight bearing       Mobility Bed Mobility               General bed mobility comments: up in chair  Transfers                      Balance                                           ADL either performed or assessed with clinical judgement   ADL Overall ADL's : Needs assistance/impaired                                       General ADL Comments: Pt seen in recliner.     Vision       Perception     Praxis      Cognition Arousal/Alertness: Lethargic Behavior During Therapy: WFL for tasks assessed/performed Overall Cognitive Status: Within Functional Limits for tasks assessed                                 General Comments: a little more alert tan she was  during yesterday's OT session but still lethargic, likely due to pain meds earlier today.        Exercises Exercises: Other exercises Other Exercises Other Exercises: e/w/h in supine. improved elbow extension today.    Shoulder Instructions       General Comments adjusted sling to correct positioning    Pertinent Vitals/ Pain       Pain Assessment: 0-10 Pain Score: 8  Pain Location: R shoulder Pain Descriptors / Indicators: Operative site guarding;Sore;Throbbing Pain Intervention(s): Limited activity within patient's tolerance;Monitored during session;Repositioned  Home Living                                          Prior Functioning/Environment              Frequency  Min 2X/week        Progress Toward Goals  OT Goals(current goals can now be found in the care  plan section)  Progress towards OT goals: Progressing toward goals  Acute Rehab OT Goals Patient Stated Goal: to get rehab before home OT Goal Formulation: With patient Time For Goal Achievement: 08/28/17 Potential to Achieve Goals: Good ADL Goals Pt Will Transfer to Toilet: with supervision;stand pivot transfer;bedside commode Additional ADL Goal #1: Pt will don doff sling mod I as precursor to adls.  Plan Discharge plan remains appropriate    Co-evaluation                 AM-PAC PT "6 Clicks" Daily Activity     Outcome Measure   Help from another person eating meals?: A Little Help from another person taking care of personal grooming?: Total Help from another person toileting, which includes using toliet, bedpan, or urinal?: Total Help from another person bathing (including washing, rinsing, drying)?: Total Help from another person to put on and taking off regular upper body clothing?: Total Help from another person to put on and taking off regular lower body clothing?: Total 6 Click Score: 8    End of Session Equipment Utilized During Treatment: Other  (comment)(sling)  OT Visit Diagnosis: Unsteadiness on feet (R26.81);Pain Pain - Right/Left: Right Pain - part of body: Shoulder   Activity Tolerance Patient limited by pain;Patient limited by lethargy   Patient Left in chair;with call bell/phone within reach   Nurse Communication Other (comment)(8/10 pain, lethargic)        Time: 2956-2130 OT Time Calculation (min): 15 min  Charges: OT General Charges $OT Visit: 1 Visit OT Treatments $Therapeutic Exercise: 8-22 mins     Hortencia Pilar 08/16/2017, 1:01 PM

## 2017-08-16 NOTE — Social Work (Signed)
CSW discussed SNF with spouse again as family really desires to go to [facility]. CSW called Magda Paganini in admissions at [facility] and she indicated that they still cannot take patient as there is an active claim and in the medical records there is a mention of her injuries relating to a prior incident.   CSW called spouse back and advised that patient has not been accepted at [facility] and that [facility] has a SNF bed.  CSW f/u for discharge.  Elissa Hefty, LCSW Clinical Social Worker [phone]

## 2017-08-16 NOTE — Progress Notes (Signed)
Report called to [facility], nurse at [facility].

## 2017-08-16 NOTE — Progress Notes (Signed)
Subjective: She was able to eat crackers with her medications last evening.  Objective: Vital signs in last 24 hours: Temp:  [97.9 F (36.6 C)-99 F (37.2 C)] 98.4 F (36.9 C) (02/01 0407) Pulse Rate:  [74-84] 79 (02/01 0407) Resp:  [15-18] 15 (02/01 0407) BP: (128-136)/(56-72) 132/72 (02/01 0407) SpO2:  [92 %] 92 % (02/01 0407) Last BM Date: 08/15/17  Intake/Output from previous day: 01/31 0701 - 02/01 0700 In: 1620 [P.O.:720; I.V.:900] Out: -  Intake/Output this shift: No intake/output data recorded.  General appearance: alert and no distress GI: soft, non-tender; bowel sounds normal; no masses,  no organomegaly  Lab Results: Recent Labs    08/14/17 0603  WBC 13.1*  HGB 10.6*  HCT 33.5*  PLT 276   BMET Recent Labs    08/14/17 0603  NA 138  K 4.3  CL 105  CO2 22  GLUCOSE 141*  BUN 17  CREATININE 1.00  CALCIUM 8.4*   LFT No results for input(s): PROT, ALBUMIN, AST, ALT, ALKPHOS, BILITOT, BILIDIR, IBILI in the last 72 hours. PT/INR No results for input(s): LABPROT, INR in the last 72 hours. Hepatitis Panel No results for input(s): HEPBSAG, HCVAB, HEPAIGM, HEPBIGM in the last 72 hours. C-Diff No results for input(s): CDIFFTOX in the last 72 hours. Fecal Lactopherrin No results for input(s): FECLLACTOFRN in the last 72 hours.  Studies/Results: No results found.  Medications:  Scheduled: . amLODipine  5 mg Oral Daily  . aspirin EC  81 mg Oral Daily  . carvedilol  3.125 mg Oral BID  . cholecalciferol  10,000 Units Oral QODAY  . diazepam  10 mg Oral QHS  . docusate sodium  100 mg Oral BID  . DULoxetine  30 mg Oral BID  . glipiZIDE  5 mg Oral BID AC  . levETIRAcetam  500 mg Oral BID  . linagliptin  5 mg Oral Daily  . pantoprazole (PROTONIX) IV  40 mg Intravenous Q12H  . pregabalin  200 mg Oral QHS  . rOPINIRole  1 mg Oral QHS  . rosuvastatin  10 mg Oral Daily  . senna  1 tablet Oral BID  . Zinc Oxide   Topical QHS   Continuous: . 0.45 % NaCl  with KCl 20 mEq / L 75 mL/hr at 08/13/17 2216    Assessment/Plan: 1) Acute dysphagia - improved. 2) History of esophageal stricture.   Clinically she is better as she was able to eat some crackers.  Plan: 1) Esophagram today.  If normal, then no further work up required. 2) PPI x 1 month and then PRN.  LOS: 3 days   Aristidis Talerico D 08/16/2017, 8:13 AM

## 2017-08-16 NOTE — Social Work (Signed)
Clinical Social Worker facilitated patient discharge including contacting patient family and facility to confirm patient discharge plans.  Clinical information faxed to facility and family agreeable with plan.    CSW arranged ambulance transport via [facility] to [facility].    RN to call [phone] to give report prior to discharge.  Clinical Social Worker will sign off for now as social work intervention is no longer needed. Please consult us again if new need arises.  Denise Micalizzi, LCSW Clinical Social Worker [phone]      

## 2017-08-16 NOTE — Progress Notes (Signed)
Patient ID: Denise Sharp, female   DOB: 07-Jan-1946, 72 y.o.   MRN: [id number]     Subjective:  Patient reports pain as mild to moderate.  Patient reports that she is some better.  Less problems with swallowing   Objective:   VITALS:   Vitals:   08/15/17 1300 08/15/17 2100 08/15/17 2248 08/16/17 0407  BP: 133/62 136/66  132/72  Pulse: 84 82 82 79  Resp: 16 18 18 15   Temp: 98.8 F (37.1 C) 99 F (37.2 C)  98.4 F (36.9 C)  TempSrc: Oral Oral  Oral  SpO2: 92%  92% 92%  Weight:      Height:        ABD soft Sensation intact distally Dorsiflexion/Plantar flexion intact Incision: dressing C/D/I and no drainage   Lab Results  Component Value Date   WBC 13.1 (H) 08/14/2017   HGB 10.6 (L) 08/14/2017   HCT 33.5 (L) 08/14/2017   MCV 92.8 08/14/2017   PLT 276 08/14/2017   BMET    Component Value Date/Time   NA 138 08/14/2017 0603   NA 143 10/20/2014   NA 141 06/21/2014 1349   K 4.3 08/14/2017 0603   K 4.0 06/21/2014 1349   CL 105 08/14/2017 0603   CL 106 06/21/2014 1349   CO2 22 08/14/2017 0603   CO2 24 06/21/2014 1349   GLUCOSE 141 (H) 08/14/2017 0603   GLUCOSE 174 (H) 06/21/2014 1349   BUN 17 08/14/2017 0603   BUN 11 10/20/2014   BUN 12 06/21/2014 1349   CREATININE 1.00 08/14/2017 0603   CREATININE 0.84 08/13/2014 1710   CALCIUM 8.4 (L) 08/14/2017 0603   CALCIUM 8.7 06/21/2014 1349   GFRNONAA 55 (L) 08/14/2017 0603   GFRNONAA 57 (L) 06/21/2014 1349   GFRNONAA 55 (L) 02/25/2014 2153   GFRNONAA 70 12/13/2011 0919   GFRAA >60 08/14/2017 0603   GFRAA >60 06/21/2014 1349   GFRAA >60 02/25/2014 2153   GFRAA 80 12/13/2011 0919     Assessment/Plan: 3 Days Post-Op   Principal Problem:   Rotator cuff arthropathy, right Active Problems:   Primary localized osteoarthrosis of shoulder   Advance diet Up with therapy Continue plan per GI Sling at all times NWB right upper ext.    Remonia Richter 08/16/2017, 8:36 AM  Discussed and agree with  above.  Appreciate GI input.  Not ready for DC until GI issues resolved and SNF available.    [name], MD Cell [phone]

## 2017-08-16 NOTE — Progress Notes (Signed)
I had a discussion with her GI doctor, and appreciate his input.  Based on studies, it does not appear like she needs any more aggressive intervention, and her symptoms of swallowing difficulty may be secondary to anesthesia and medication related around the time of surgery with GI motility issues.  We are going to plan for conservative management, reassurance offered, I will plan to transfer her to skilled nursing facility once a bed is available.  Johnny Bridge, MD

## 2017-08-16 NOTE — Progress Notes (Signed)
Report attempted at [facility], voicemail left. Will try again later.

## 2017-08-16 NOTE — Plan of Care (Signed)
  Progressing Health Behavior/Discharge Planning: Ability to manage health-related needs will improve 08/16/2017 0848 - Progressing by Harlin Heys, RN Clinical Measurements: Ability to maintain clinical measurements within normal limits will improve 08/16/2017 0848 - Progressing by Harlin Heys, RN Will remain free from infection 08/16/2017 0848 - Progressing by Harlin Heys, RN Diagnostic test results will improve 08/16/2017 0848 - Progressing by Harlin Heys, RN Respiratory complications will improve 08/16/2017 0848 - Progressing by Harlin Heys, RN Cardiovascular complication will be avoided 08/16/2017 0848 - Progressing by Harlin Heys, RN Activity: Risk for activity intolerance will decrease 08/16/2017 0848 - Progressing by Harlin Heys, RN Nutrition: Adequate nutrition will be maintained 08/16/2017 0848 - Progressing by Harlin Heys, RN Coping: Level of anxiety will decrease 08/16/2017 0848 - Progressing by Harlin Heys, RN Elimination: Will not experience complications related to bowel motility 08/16/2017 0848 - Progressing by Harlin Heys, RN Will not experience complications related to urinary retention 08/16/2017 0848 - Progressing by Harlin Heys, RN Pain Managment: General experience of comfort will improve 08/16/2017 0848 - Progressing by Harlin Heys, RN Safety: Ability to remain free from injury will improve 08/16/2017 0848 - Progressing by Harlin Heys, RN Skin Integrity: Risk for impaired skin integrity will decrease 08/16/2017 0848 - Progressing by Harlin Heys, RN Activity: Ability to tolerate increased activity will improve 08/16/2017 0848 - Progressing by Harlin Heys, RN Pain Management: Pain level will decrease with appropriate interventions 08/16/2017 0848 - Progressing by Harlin Heys, RN

## 2017-08-16 NOTE — Discharge Summary (Signed)
Physician Discharge Summary  Patient ID: Denise Sharp MRN: [id number] DOB/AGE: 72/27/1947 72 y.o.  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 Abnormality of gait 03/25/2013  . Adrenal mass, left (Denise Sharp)   . Anemia    iron deficient post  2 unit txfsn 2009, normal endo/colonoscopy by Denise Sharp  . Arthritis   . Atypical chest pain    a. 04/1986 Cath (Denise Sharp): nl cors, EF 65%; b. 07/2012 Cath Denise Sharp): LM nl, LAD/LCX/RCA w/ min irregs; c. 07/2016 MV Denise Sharp): "Equivocal" w/ small inf wall rev defect and hyperdynamic LV contraction, EF 87%; d. 08/2016 Cardiac CT Ca2+ score Denise Sharp): Ca2+ score 1548.  [name] Cervical spinal stenosis 1994   due to trauma to back (Denise Sharp), has intermittent paralysis and parasthesias  . Cervicogenic headache 03/23/2014  . Depression   . Dizziness    chronic dizziness  . DJD (degenerative joint disease)    a. Chronic R shoulder pain pending R shoulder replacement 07/2017.  [name] Esophageal stenosis March 2011   with transietn outlet obstruction by food, cleared by EGD   . Family history of adverse reaction to anesthesia    daughter PONV  . Gastric bypass status for obesity   . Headache(784.0)   . Hypertension   . IBS (irritable bowel syndrome)   . Left bundle branch block (LBBB)    a. Intermittently present - likely rate related. - patient denies  . Obesity   . Obstructive sleep apnea    using CPAP  . 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 Restless leg syndrome   . Rotator cuff arthropathy, right 08/13/2017  . Syncope and collapse 03/12/2014  . Type II diabetes mellitus (Denise Sharp)     Surgeries: Procedure(s): REVERSE RIGHT SHOULDER ARTHROPLASTY on  08/13/2017   Consultants (if any): Treatment Team:  Denise Ada, MD  Discharged Condition: Improved  Hospital Course: Denise Sharp is an 72 y.o. female who was admitted 08/13/2017 with a diagnosis of Rotator cuff arthropathy, right and went to the operating room on 08/13/2017 and underwent the above named procedures.    She was given perioperative antibiotics:  Anti-infectives (From admission, onward)   Start     Dose/Rate Route Frequency Ordered Stop   08/13/17 1900  fluconazole (DIFLUCAN) tablet 150 mg  Status:  Discontinued     150 mg Oral Daily 08/13/17 1859 08/13/17 1912   08/13/17 1900  ceFAZolin (ANCEF) IVPB 1 g/50 mL premix     1 g 100 mL/hr over 30 Minutes Intravenous Every 6 hours 08/13/17 1859 08/14/17 0722   08/13/17 1217  ceFAZolin (ANCEF) 2-4 GM/100ML-% IVPB    Comments:  Denise Sharp   : cabinet override      08/13/17 1217 08/13/17 1422   08/13/17 1215  ceFAZolin (ANCEF) IVPB 2g/100 mL premix    Comments:  Pt. States that all antibiotics cause GI upset/diarrhea and it is not an anaphylactic reaction.  Dr. Mardelle Sharp stated they can give test dose in OR.   2 g 200 mL/hr over 30 Minutes Intravenous  Once 08/13/17 1205 08/13/17 1422    .  She was given sequential compression devices, early ambulation,  for DVT prophylaxis.  She benefited maximally from the hospital stay and there were no complications.  She  developed some transient swallowing issues, GI consulted, recommended conservative course.    Recent vital signs:  Vitals:   08/16/17 0407 08/16/17 0900  BP: 132/72 129/62  Pulse: 79 71  Resp: 15 16  Temp: 98.4 F (36.9 C) 98.2 F (36.8 C)  SpO2: 92% 97%    Recent laboratory studies:  Lab Results  Component Value Date   HGB 10.6 (L) 08/14/2017   HGB 13.5 07/30/2017   HGB 13.3 01/31/2017   Lab Results  Component Value Date   WBC 13.1 (H) 08/14/2017   PLT 276 08/14/2017   No results found for: INR Lab Results  Component Value Date   NA 138  08/14/2017   K 4.3 08/14/2017   CL 105 08/14/2017   CO2 22 08/14/2017   BUN 17 08/14/2017   CREATININE 1.00 08/14/2017   GLUCOSE 141 (H) 08/14/2017    Discharge Medications:   Allergies as of 08/16/2017      Reactions   Amoxicillin Diarrhea, Other (See Comments)   .PATIENT HAS HAD A PCN REACTION WITH IMMEDIATE RASH, FACIAL/TONGUE/THROAT SWELLING, SOB, OR LIGHTHEADEDNESS WITH HYPOTENSION:  #  #  #  YES  #  #  #   Has patient had a PCN reaction causing severe rash involving mucus membranes or skin necrosis: No Has patient had a PCN reaction that required hospitalization: No Has patient had a PCN reaction occurring within the last 10 years: #  #  #  YES  #  #  #  TOLERATED cefazolin pre-op 08/13/17   Demeclocycline Hives   Erythromycin Nausea And Vomiting, Other (See Comments)   Severe irritable bowel   Flagyl [metronidazole] Nausea And Vomiting, Other (See Comments)   Severe irritable bowel   Glucophage [metformin Hcl] Nausea And Vomiting, Other (See Comments)   "Sick"   Tetracyclines & Related Hives, Rash   Biaxin [clarithromycin] Nausea And Vomiting, Other (See Comments)   Severe irritable bowel   Diovan [valsartan]    UNSPECIFIED REACTION    Xanax [alprazolam]    UNSPECIFIED REACTION    Sulfa Antibiotics Rash, Other (See Comments)   As child      Medication List    TAKE these medications   albuterol 108 (90 Base) MCG/ACT inhaler Commonly known as:  PROVENTIL HFA;VENTOLIN HFA Inhale 2 puffs into the lungs every 8 (eight) hours as needed for wheezing or shortness of breath.   amLODipine 5 MG tablet Commonly known as:  NORVASC Take 1 tablet (5 mg total) by mouth daily.   aspirin EC 81 MG tablet Take 1 tablet (81 mg total) by mouth daily.   baclofen 10 MG tablet Commonly known as:  LIORESAL Take 1 tablet (10 mg total) by mouth 3 (three) times daily. As needed for muscle spasm   benzonatate 200 MG capsule Commonly known as:  TESSALON Take 200 mg by mouth 3 (three)  times daily as needed for cough.   butalbital-acetaminophen-caffeine 50-325-40 MG tablet Commonly known as:  FIORICET, ESGIC Take 1 tablet by mouth every 6 (six) hours as needed for headache.   carvedilol 3.125 MG tablet Commonly known as:  COREG Take 1 tablet (3.125 mg total) by mouth 2 (two) times daily.   diazepam 10 MG tablet Commonly known as:  VALIUM Take 1 tablet (10 mg total) by mouth at bedtime. 1 TABLET DAILY AS NEEDED FOR ANXIETY OR INSOMNIA What changed:  additional instructions   diphenhydrAMINE 25 MG tablet Commonly known as:  BENADRYL Take 25 mg by mouth every 6 (six)  hours as needed for itching.   DULoxetine 30 MG capsule Commonly known as:  CYMBALTA Take 1 capsule (30 mg total) by mouth 2 (two) times daily.   ergocalciferol 50000 units capsule Commonly known as:  DRISDOL Take 1 capsule (50,000 Units total) by mouth once a week.   fluconazole 150 MG tablet Commonly known as:  DIFLUCAN Take 1 tablet (150 mg total) by mouth daily.   furosemide 20 MG tablet Commonly known as:  LASIX Take 20 mg by mouth daily as needed for fluid.   glipiZIDE 5 MG tablet Commonly known as:  GLUCOTROL Take 1 tablet (5 mg total) by mouth 2 (two) times daily before a meal.   HYDROcodone-acetaminophen 10-325 MG tablet Commonly known as:  NORCO Take 1-2 tablets by mouth every 6 (six) hours as needed.   levETIRAcetam 500 MG tablet Commonly known as:  KEPPRA Take 1 tablet (500 mg total) by mouth 2 (two) times daily.   LUNESTA 2 MG Tabs tablet Generic drug:  eszopiclone Take 2 mg immediately before bedtime   ondansetron 4 MG tablet Commonly known as:  ZOFRAN Take 4 mg by mouth every 8 (eight) hours as needed for nausea or vomiting. What changed:  Another medication with the same name was added. Make sure you understand how and when to take each.   ondansetron 4 MG tablet Commonly known as:  ZOFRAN Take 1 tablet (4 mg total) by mouth every 8 (eight) hours as needed for  nausea or vomiting. What changed:  You were already taking a medication with the same name, and this prescription was added. Make sure you understand how and when to take each.   pregabalin 100 MG capsule Commonly known as:  LYRICA Take 1 capsule (100 mg total) by mouth 2 (two) times daily. What changed:    how much to take  when to take this   rOPINIRole 1 MG tablet Commonly known as:  REQUIP Take 1 tablet (1 mg total) by mouth at bedtime.   rosuvastatin 10 MG tablet Commonly known as:  CRESTOR Take 1 tablet (10 mg total) by mouth daily.   sennosides-docusate sodium 8.6-50 MG tablet Commonly known as:  SENOKOT-S Take 2 tablets by mouth daily.   sitaGLIPtin 100 MG tablet Commonly known as:  JANUVIA Take 1 tablet (100 mg total) by mouth daily.   tizanidine 2 MG capsule Commonly known as:  ZANAFLEX Take 2 mg by mouth 3 (three) times daily as needed for muscle spasms.   traMADol 50 MG tablet Commonly known as:  ULTRAM Take 1 tablet (50 mg total) by mouth daily as needed. What changed:  reasons to take this   VITAMIN D PO Take 10,000 Units by mouth every other day.   ZINC OXIDE EX Apply 1 application topically at bedtime.   BOUDREAUXS BUTT PASTE EX Apply 1 application topically at bedtime.       Diagnostic Studies: Dg Esophagus  Result Date: 08/16/2017 CLINICAL DATA:  72 year old female recently status post shoulder surgery with dysphagia as an inpatient. Feeling that solids and medication tablets are sticking. EXAM: ESOPHOGRAM/BARIUM SWALLOW TECHNIQUE: Single contrast examination was performed using thin barium, and barium tablet. FLUOROSCOPY TIME:  Fluoroscopy Time:  1 min 54 sec Radiation Exposure Index (if provided by the fluoroscopic device): Number of Acquired Spot Images: 0 COMPARISON:  Report of barium swallow and upper GI series 10/11/2003 (no images available). CT Abdomen and Pelvis 04/03/2016 FINDINGS: A single contrast study was undertaken and the patient  tolerated this well  and without difficulty. No obstruction to the forward flow of contrast throughout the esophagus and into the stomach, but frequent esophageal tertiary contractions (series 1, image 58). The distal esophagus demonstrates a short segment fairly smooth area of circumferential tapering (series 6, image 1 and series 14, image 21) which did not permit passage of a 35.5 millimeter barium tablet despite multiple additional swallows of water and thin barium estimated lumen diameter of the narrow it segment is 6-7 mm. Postoperative changes to the esophageal hiatus and stomach are demonstrated on the 2017 CT, which appear concordant with the visible proximal stomach configuration. IMPRESSION: 1. Short-segment benign-appearing stricture at the distal esophagus just proximal to the stomach which did not permit passage of a 97.4 millimeter barium tablet, and is likely the symptomatic abnormality. 2. Superimposed resp esophagus with frequent tertiary esophageal contractions. Electronically Signed   By: Genevie Ann M.D.   On: 08/16/2017 09:55   Dg Shoulder Right Port  Result Date: 08/13/2017 CLINICAL DATA:  Status post right shoulder placement EXAM: PORTABLE RIGHT SHOULDER COMPARISON:  03/04/2017 FINDINGS: Changes of right shoulder replacement. Normal alignment. No hardware complicating feature. Degenerative changes in the right AC joint. IMPRESSION: Right shoulder replacement. No visible hardware or bony complicating feature. Electronically Signed   By: Rolm Baptise M.D.   On: 08/13/2017 19:21    Disposition: SNF    Follow-up Information    [name], MD. Schedule an appointment as soon as possible for a visit in 2 weeks.   Specialty:  Orthopedic Surgery Contact information: [address] [place] [address] [phone]            Signed: Johnny Bridge 08/16/2017, 12:59 PM

## 2017-08-16 NOTE — Care Management Important Message (Signed)
Important Message  Patient Details  Name: Denise Sharp MRN: [id number] Date of Birth: [birth year]   Medicare Important Message Given:  Yes    Carles Collet, RN 08/16/2017, 11:46 AM

## 2017-08-16 NOTE — Clinical Social Work Placement (Signed)
   CLINICAL SOCIAL WORK PLACEMENT  NOTE  Date:  08/16/2017  Patient Details  Name: Denise Sharp MRN: [id number] Date of Birth: 15-Feb-1946  Clinical Social Work is seeking post-discharge placement for this patient at the [place] level of care (*CSW will initial, date and re-position this form in  chart as items are completed):  Yes   Patient/family provided with [place] Work Department's list of facilities offering this level of care within the geographic area requested by the patient (or if unable, by the patient's family).  Yes   Patient/family informed of their freedom to choose among providers that offer the needed level of care, that participate in Medicare, Medicaid or managed care program needed by the patient, have an available bed and are willing to accept the patient.  Yes   Patient/family informed of [facility]'s ownership interest in [facility] and [facility], as well as of the fact that they are under no obligation to receive care at these facilities.  PASRR submitted to EDS on       PASRR number received on 08/15/17     Existing PASRR number confirmed on       FL2 transmitted to all facilities in geographic area requested by pt/family on 08/15/17     FL2 transmitted to all facilities within larger geographic area on       Patient informed that his/her managed care company has contracts with or will negotiate with certain facilities, including the following:        Yes   Patient/family informed of bed offers received.  Patient chooses bed at [facility]     Physician recommends and patient chooses bed at      Patient to be transferred to [facility] on  .  Patient to be transferred to facility by [facility]     Patient family notified on 08/16/17 of transfer.  Name of family member notified:  spouse at bedside     PHYSICIAN       Additional Comment:     _______________________________________________ Normajean Baxter, LCSW 08/16/2017, 3:26 PM

## 2017-08-16 NOTE — Social Work (Addendum)
CSW contacted [facility] and they indicated they will review and see if they can offer a SNF bed.  CSW called spouse and advised of SNF.  CSW will continue to follow.  Elissa Hefty, LCSW Clinical Social Worker [phone]

## 2017-08-19 ENCOUNTER — Emergency Department (HOSPITAL_COMMUNITY): Payer: Medicare Other

## 2017-08-19 ENCOUNTER — Other Ambulatory Visit: Payer: Self-pay

## 2017-08-19 ENCOUNTER — Inpatient Hospital Stay (HOSPITAL_COMMUNITY)
Admission: EM | Admit: 2017-08-19 | Discharge: 2017-08-21 | DRG: 071 | Disposition: A | Discharge status: Skilled Nursing Facility | Payer: Medicare Other | Attending: Internal Medicine | Admitting: Internal Medicine

## 2017-08-19 ENCOUNTER — Encounter (HOSPITAL_COMMUNITY): Payer: Self-pay

## 2017-08-19 DIAGNOSIS — Z881 Allergy status to other antibiotic agents status: Secondary | ICD-10-CM

## 2017-08-19 DIAGNOSIS — E1142 Type 2 diabetes mellitus with diabetic polyneuropathy: Secondary | ICD-10-CM | POA: Diagnosis present

## 2017-08-19 DIAGNOSIS — G4733 Obstructive sleep apnea (adult) (pediatric): Secondary | ICD-10-CM

## 2017-08-19 DIAGNOSIS — Z96611 Presence of right artificial shoulder joint: Secondary | ICD-10-CM | POA: Diagnosis present

## 2017-08-19 DIAGNOSIS — Z79899 Other long term (current) drug therapy: Secondary | ICD-10-CM

## 2017-08-19 DIAGNOSIS — R410 Disorientation, unspecified: Secondary | ICD-10-CM | POA: Diagnosis present

## 2017-08-19 DIAGNOSIS — Z880 Allergy status to penicillin: Secondary | ICD-10-CM

## 2017-08-19 DIAGNOSIS — Z9884 Bariatric surgery status: Secondary | ICD-10-CM

## 2017-08-19 DIAGNOSIS — I959 Hypotension, unspecified: Secondary | ICD-10-CM | POA: Diagnosis present

## 2017-08-19 DIAGNOSIS — G4700 Insomnia, unspecified: Secondary | ICD-10-CM | POA: Diagnosis not present

## 2017-08-19 DIAGNOSIS — E119 Type 2 diabetes mellitus without complications: Secondary | ICD-10-CM | POA: Diagnosis present

## 2017-08-19 DIAGNOSIS — I10 Essential (primary) hypertension: Secondary | ICD-10-CM | POA: Diagnosis not present

## 2017-08-19 DIAGNOSIS — G9341 Metabolic encephalopathy: Secondary | ICD-10-CM | POA: Diagnosis not present

## 2017-08-19 DIAGNOSIS — E1165 Type 2 diabetes mellitus with hyperglycemia: Secondary | ICD-10-CM | POA: Diagnosis present

## 2017-08-19 DIAGNOSIS — R55 Syncope and collapse: Secondary | ICD-10-CM | POA: Diagnosis present

## 2017-08-19 DIAGNOSIS — I2510 Atherosclerotic heart disease of native coronary artery without angina pectoris: Secondary | ICD-10-CM | POA: Diagnosis present

## 2017-08-19 DIAGNOSIS — Z8249 Family history of ischemic heart disease and other diseases of the circulatory system: Secondary | ICD-10-CM

## 2017-08-19 DIAGNOSIS — Z882 Allergy status to sulfonamides status: Secondary | ICD-10-CM

## 2017-08-19 DIAGNOSIS — F5105 Insomnia due to other mental disorder: Secondary | ICD-10-CM | POA: Diagnosis present

## 2017-08-19 DIAGNOSIS — R4182 Altered mental status, unspecified: Secondary | ICD-10-CM | POA: Diagnosis present

## 2017-08-19 DIAGNOSIS — R4020 Unspecified coma: Secondary | ICD-10-CM | POA: Diagnosis not present

## 2017-08-19 DIAGNOSIS — Z90710 Acquired absence of both cervix and uterus: Secondary | ICD-10-CM

## 2017-08-19 DIAGNOSIS — Z7982 Long term (current) use of aspirin: Secondary | ICD-10-CM

## 2017-08-19 DIAGNOSIS — R2681 Unsteadiness on feet: Secondary | ICD-10-CM | POA: Diagnosis not present

## 2017-08-19 DIAGNOSIS — R400 Somnolence: Secondary | ICD-10-CM

## 2017-08-19 DIAGNOSIS — Z9989 Dependence on other enabling machines and devices: Secondary | ICD-10-CM

## 2017-08-19 DIAGNOSIS — Z7984 Long term (current) use of oral hypoglycemic drugs: Secondary | ICD-10-CM

## 2017-08-19 DIAGNOSIS — E1140 Type 2 diabetes mellitus with diabetic neuropathy, unspecified: Secondary | ICD-10-CM | POA: Diagnosis present

## 2017-08-19 DIAGNOSIS — E1149 Type 2 diabetes mellitus with other diabetic neurological complication: Secondary | ICD-10-CM | POA: Diagnosis present

## 2017-08-19 DIAGNOSIS — E785 Hyperlipidemia, unspecified: Secondary | ICD-10-CM | POA: Diagnosis present

## 2017-08-19 DIAGNOSIS — E11649 Type 2 diabetes mellitus with hypoglycemia without coma: Secondary | ICD-10-CM | POA: Diagnosis present

## 2017-08-19 DIAGNOSIS — Z96653 Presence of artificial knee joint, bilateral: Secondary | ICD-10-CM | POA: Diagnosis present

## 2017-08-19 DIAGNOSIS — N179 Acute kidney failure, unspecified: Secondary | ICD-10-CM | POA: Diagnosis present

## 2017-08-19 DIAGNOSIS — Z471 Aftercare following joint replacement surgery: Secondary | ICD-10-CM | POA: Diagnosis not present

## 2017-08-19 DIAGNOSIS — R464 Slowness and poor responsiveness: Secondary | ICD-10-CM | POA: Diagnosis not present

## 2017-08-19 DIAGNOSIS — E860 Dehydration: Secondary | ICD-10-CM | POA: Diagnosis present

## 2017-08-19 DIAGNOSIS — G2581 Restless legs syndrome: Secondary | ICD-10-CM | POA: Diagnosis present

## 2017-08-19 DIAGNOSIS — G3184 Mild cognitive impairment, so stated: Secondary | ICD-10-CM | POA: Diagnosis not present

## 2017-08-19 DIAGNOSIS — F419 Anxiety disorder, unspecified: Secondary | ICD-10-CM | POA: Diagnosis not present

## 2017-08-19 DIAGNOSIS — R404 Transient alteration of awareness: Secondary | ICD-10-CM | POA: Diagnosis not present

## 2017-08-19 DIAGNOSIS — R52 Pain, unspecified: Secondary | ICD-10-CM

## 2017-08-19 LAB — COMPREHENSIVE METABOLIC PANEL
ALT: 12 U/L — ABNORMAL LOW (ref 14–54)
AST: 19 U/L (ref 15–41)
Albumin: 2.8 g/dL — ABNORMAL LOW (ref 3.5–5.0)
Alkaline Phosphatase: 81 U/L (ref 38–126)
Anion gap: 13 (ref 5–15)
BUN: 15 mg/dL (ref 6–20)
CO2: 22 mmol/L (ref 22–32)
Calcium: 8.5 mg/dL — ABNORMAL LOW (ref 8.9–10.3)
Chloride: 103 mmol/L (ref 101–111)
Creatinine, Ser: 1.36 mg/dL — ABNORMAL HIGH (ref 0.44–1.00)
GFR calc Af Amer: 44 mL/min — ABNORMAL LOW (ref >60)
GFR calc non Af Amer: 38 mL/min — ABNORMAL LOW (ref >60)
Glucose, Bld: 184 mg/dL — ABNORMAL HIGH (ref 65–99)
Potassium: 4.4 mmol/L (ref 3.5–5.1)
Sodium: 138 mmol/L (ref 135–145)
Total Bilirubin: 0.6 mg/dL (ref 0.3–1.2)
Total Protein: 6.2 g/dL — ABNORMAL LOW (ref 6.5–8.1)

## 2017-08-19 LAB — URINALYSIS, ROUTINE W REFLEX MICROSCOPIC
Bilirubin Urine: NEGATIVE
Glucose, UA: NEGATIVE mg/dL
Hgb urine dipstick: NEGATIVE
Ketones, ur: NEGATIVE mg/dL
Leukocytes, UA: NEGATIVE
Nitrite: NEGATIVE
Protein, ur: NEGATIVE mg/dL
Specific Gravity, Urine: 1.015 (ref 1.005–1.030)
pH: 5 (ref 5.0–8.0)

## 2017-08-19 LAB — CBC
HCT: 33.1 % — ABNORMAL LOW (ref 36.0–46.0)
Hemoglobin: 10.6 g/dL — ABNORMAL LOW (ref 12.0–15.0)
MCH: 29.1 pg (ref 26.0–34.0)
MCHC: 32 g/dL (ref 30.0–36.0)
MCV: 90.9 fL (ref 78.0–100.0)
Platelets: 317 10*3/uL (ref 150–400)
RBC: 3.64 MIL/uL — ABNORMAL LOW (ref 3.87–5.11)
RDW: 14.3 % (ref 11.5–15.5)
WBC: 12.4 10*3/uL — ABNORMAL HIGH (ref 4.0–10.5)

## 2017-08-19 LAB — I-STAT CHEM 8, ED
BUN: 15 mg/dL (ref 6–20)
Calcium, Ion: 1.05 mmol/L — ABNORMAL LOW (ref 1.15–1.40)
Chloride: 101 mmol/L (ref 101–111)
Creatinine, Ser: 1.2 mg/dL — ABNORMAL HIGH (ref 0.44–1.00)
Glucose, Bld: 176 mg/dL — ABNORMAL HIGH (ref 65–99)
HCT: 31 % — ABNORMAL LOW (ref 36.0–46.0)
Hemoglobin: 10.5 g/dL — ABNORMAL LOW (ref 12.0–15.0)
Potassium: 4.3 mmol/L (ref 3.5–5.1)
Sodium: 137 mmol/L (ref 135–145)
TCO2: 24 mmol/L (ref 22–32)

## 2017-08-19 LAB — AMMONIA: Ammonia: <9 umol/L — ABNORMAL LOW (ref 9–35)

## 2017-08-19 MED ORDER — SODIUM CHLORIDE 0.9 % IV SOLN
Freq: Once | INTRAVENOUS | Status: AC
  Administered 2017-08-19: via INTRAVENOUS

## 2017-08-19 MED ORDER — SODIUM CHLORIDE 0.9 % IV BOLUS (SEPSIS)
500.0000 mL | Freq: Once | INTRAVENOUS | Status: AC
  Administered 2017-08-19: 500 mL via INTRAVENOUS

## 2017-08-19 MED ORDER — NALOXONE HCL 0.4 MG/ML IJ SOLN
INTRAMUSCULAR | Status: AC
Start: 2017-08-19 — End: 2017-08-20
  Filled 2017-08-19: qty 1

## 2017-08-19 MED ORDER — NALOXONE HCL 0.4 MG/ML IJ SOLN
0.4000 mg | Freq: Once | INTRAMUSCULAR | Status: AC
  Administered 2017-08-19: 0.4 mg via INTRAVENOUS

## 2017-08-19 NOTE — ED Provider Notes (Signed)
[facility] EMERGENCY DEPARTMENT Provider Note   CSN: [id number] Arrival date & time: 08/19/17  1942     History   Chief Complaint Chief Complaint  Patient presents with  . Altered Mental Status    HPI Denise Sharp is a 72 y.o. female.    Altered Mental Status    This is a new problem. The current episode started 2 days ago. The problem has been gradually worsening. Associated symptoms include confusion and somnolence. Risk factors: recent surgery.  Her past medical history is significant for diabetes and hypertension.    Past Medical History:  Diagnosis Date  . Abnormality of gait 03/25/2013  . Adrenal mass, left ([place])   . Anemia    iron deficient post  2 unit txfsn 2009, normal endo/colonoscopy by [facility]  . Arthritis   . Atypical chest pain    a. 04/1986 Cath ([facility]): nl cors, EF 65%; b. 07/2012 Cath [facility]): LM nl, LAD/LCX/RCA w/ min irregs; c. 07/2016 MV [facility]): "Equivocal" w/ small inf wall rev defect and hyperdynamic LV contraction, EF 87%; d. 08/2016 Cardiac CT Ca2+ score [facility]): Ca2+ score 1548.  [name] Cervical spinal stenosis 1994   due to trauma to back ([employer] accident), has intermittent paralysis and parasthesias  . Cervicogenic headache 03/23/2014  . Depression   . Dizziness    chronic dizziness  . DJD (degenerative joint disease)    a. Chronic R shoulder pain pending R shoulder replacement 07/2017.  [name] Esophageal stenosis March 2011   with transietn outlet obstruction by food, cleared by EGD   . Family history of adverse reaction to anesthesia    daughter PONV  . Gastric bypass status for obesity   . Headache(784.0)   . Hypertension   . IBS (irritable bowel syndrome)   . Left bundle branch block (LBBB)    a. Intermittently present - likely rate related. - patient denies  . Obesity   . Obstructive sleep apnea    using CPAP  . 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 Restless leg syndrome   . Rotator cuff arthropathy, right 08/13/2017  .  Syncope and collapse 03/12/2014  . Type II diabetes mellitus [facility])     Patient Active Problem List   Diagnosis Date Noted  . Rotator cuff arthropathy, right 08/13/2017  . Primary localized osteoarthrosis of shoulder 08/13/2017  . Encounter for preoperative examination for general surgical procedure 08/03/2017  . 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 Candidiasis, intertrigo 08/03/2017  . Acute shoulder pain due to trauma, right 03/05/2017  . 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 Atypical chest pain 10/16/2016  . Dizziness 10/16/2016  . Edema 12/13/2015  . Pain and swelling of right lower leg 12/02/2015  . Hypersomnia, persistent 06/18/2015  . Candida rash of groin 10/31/2014  . Mechanical breakdown of implanted electronic neurostimulator of peripheral nerve ([place]) 10/16/2014  . Acute cystitis without hematuria 10/13/2014  . Obesity hypoventilation syndrome ([place]) 08/13/2014  . Frequent falls 06/23/2014  . Chronic cough 05/05/2014  . Adenoma of left adrenal gland 03/24/2014  . Cervicogenic headache 03/23/2014  . Syncope and collapse 03/12/2014  . Vitamin D deficiency 01/06/2014  . Pannus, abdominal 12/23/2013  . Rectal bleeding 12/03/2013  . Weight gain 12/03/2013  . Obesity, unspecified 08/25/2013  . Fatigue due to depression 08/04/2013  . Diaphragmatic hernia 04/06/2013  . Diabetic polyneuropathy ([place]) 03/25/2013  . Abnormality of gait 03/25/2013  . Multiple pulmonary nodules 03/20/2013  .  Shortness of breath 03/20/2013  . Anemia 08/06/2012  . Iron deficiency anemia 08/06/2012  . Functional disorder of bladder 07/15/2012  . Incomplete bladder emptying 07/15/2012  . 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 Urge incontinence 07/15/2012  . Increased frequency of urination 07/15/2012  . OSA on CPAP 03/02/2012  . Insomnia secondary to anxiety 02/29/2012  . Sciatica 09/05/2011  . Cervical stenosis of spinal  canal 06/14/2011  . Restless legs syndrome 03/13/2011  . Degenerative disk disease 03/13/2011  . Hypertension 03/12/2011  . DM type 2 with diabetic peripheral neuropathy ([place]) 03/12/2011  . Hearing loss, right 03/12/2011    Past Surgical History:  Procedure Laterality Date  . ABDOMINAL HYSTERECTOMY    . APPENDECTOMY    . COLONOSCOPY    . DIAPHRAGMATIC HERNIA REPAIR  2015  . ESOPHAGEAL DILATION     multiple  . ESOPHAGOGASTRODUODENOSCOPY    . [place]  . GALLBLADDER SURGERY  resection  . GASTRIC BYPASS    . GASTRIC BYPASS  2000, 2005   Dr. Debroah Loop  . [facility] IMPLANT PLACEMENT  April 2013   [place]  . JOINT REPLACEMENT  2007   bilateral knee. [name],  [name]  . REVERSE SHOULDER ARTHROPLASTY Right 08/13/2017   Procedure: REVERSE RIGHT SHOULDER ARTHROPLASTY;  Surgeon: [name], MD;  Location: [facility];  Service: Orthopedics;  Laterality: Right;  . ROTATOR CUFF REPAIR     right  . [place]  . TOTAL ABDOMINAL HYSTERECTOMY W/ BILATERAL SALPINGOOPHORECTOMY  1974  . UMBILICAL HERNIA REPAIR  Aug 11, 2015    OB History    No data available       Home Medications    Prior to Admission medications   Medication Sig Start Date End Date Taking? Authorizing Provider  albuterol (PROVENTIL HFA;VENTOLIN HFA) 108 (90 Base) MCG/ACT inhaler Inhale 2 puffs into the lungs every 8 (eight) hours as needed for wheezing or shortness of breath. 05/29/16   Burnard Hawthorne, FNP  amLODipine (NORVASC) 5 MG tablet Take 1 tablet (5 mg total) by mouth daily. 03/06/17   End, Harrell Gave, MD  aspirin EC 81 MG tablet Take 1 tablet (81 mg total) by mouth daily. 03/06/17   End, Harrell Gave, MD  baclofen (LIORESAL) 10 MG tablet Take 1 tablet (10 mg total) by mouth 3 (three) times daily. As needed for muscle spasm 08/13/17   [name], MD  benzonatate (TESSALON) 200 MG capsule Take 200 mg by mouth 3 (three) times daily as needed for cough.    [provider]   butalbital-acetaminophen-caffeine (FIORICET, ESGIC) [phone] MG tablet Take 1 tablet by mouth every 6 (six) hours as needed for headache. 03/05/17   [name], MD  carvedilol (COREG) 3.125 MG tablet Take 1 tablet (3.125 mg total) by mouth 2 (two) times daily. 03/06/17 07/22/17  End, Harrell Gave, MD  Cholecalciferol (VITAMIN D PO) Take 5,000 Units by mouth every other day.     [provider]  diazepam (VALIUM) 10 MG tablet Take 1 tablet (10 mg total) by mouth at bedtime. 1 TABLET DAILY AS NEEDED FOR ANXIETY OR INSOMNIA Patient taking differently: Take 10 mg by mouth at bedtime.  04/23/17   [name], MD  diphenhydrAMINE (BENADRYL) 25 MG tablet Take 25 mg by mouth every 6 (six) hours as needed for itching.     [provider]  DULoxetine (CYMBALTA) 30 MG capsule Take 1 capsule (30 mg total) by mouth 2 (two) times daily. 07/31/17  Kathrynn Ducking, MD  fluconazole (DIFLUCAN) 150 MG tablet Take 1 tablet (150 mg total) by mouth daily. 08/08/17   [name], MD  furosemide (LASIX) 20 MG tablet Take 20 mg by mouth daily as needed for fluid.    [provider]  glipiZIDE (GLUCOTROL) 5 MG tablet Take 1 tablet (5 mg total) by mouth 2 (two) times daily before a meal. 08/02/17   [name], MD  HYDROcodone-acetaminophen (NORCO) 10-325 MG tablet Take 1-2 tablets by mouth every 6 (six) hours as needed. 08/13/17   [name], MD  levETIRAcetam (KEPPRA) 500 MG tablet Take 1 tablet (500 mg total) by mouth 2 (two) times daily. 08/08/17   Kathrynn Ducking, MD  LUNESTA 2 MG TABS tablet Take 2 mg immediately before bedtime 07/23/17   [name], MD  ondansetron (ZOFRAN) 4 MG tablet Take 1 tablet (4 mg total) by mouth every 8 (eight) hours as needed for nausea or vomiting. 08/13/17   [name], MD  pregabalin (LYRICA) 100 MG capsule Take 1 capsule (100 mg total) by mouth 2 (two) times daily. Patient taking differently: Take 200 mg by mouth at bedtime.  04/23/17    [name], MD  rOPINIRole (REQUIP) 1 MG tablet Take 1 tablet (1 mg total) by mouth at bedtime. 08/15/16   [name], MD  rosuvastatin (CRESTOR) 10 MG tablet Take 1 tablet (10 mg total) by mouth daily. 03/06/17   End, Harrell Gave, MD  sennosides-docusate sodium (SENOKOT-S) 8.6-50 MG tablet Take 2 tablets by mouth daily. 08/13/17   [name], MD  sitaGLIPtin (JANUVIA) 100 MG tablet Take 1 tablet (100 mg total) by mouth daily. 02/13/17   [name], MD  tizanidine (ZANAFLEX) 2 MG capsule Take 2 mg by mouth 3 (three) times daily as needed for muscle spasms.     [provider]  traMADol (ULTRAM) 50 MG tablet Take 1 tablet (50 mg total) by mouth daily as needed. Patient taking differently: Take 50 mg by mouth daily as needed for moderate pain.  04/23/17   [name], MD  Zinc Oxide (BOUDREAUXS BUTT PASTE EX) Apply 1 application topically at bedtime.    [provider]    Family History Family History  Problem Relation Age of Onset  . Heart disease Father   . Hypertension Father   . Prostate cancer Father   . Stroke Father   . Osteoporosis Father   . Stroke Mother   . Depression Mother   . Headache Mother   . Heart disease Mother   . Thyroid disease Mother   . Hypertension Mother   . Diabetes Daughter   . Heart disease Daughter   . Hypertension Daughter   . Hypertension Son     Social History Social History   Tobacco Use  . Smoking status: Never Smoker  . Smokeless tobacco: Never Used  Substance Use Topics  . Alcohol use: No  . Drug use: No     Allergies   Amoxicillin; Demeclocycline; Erythromycin; Flagyl [metronidazole]; Glucophage [metformin hcl]; Tetracyclines & related; Biaxin [clarithromycin]; Diovan [valsartan]; Xanax [alprazolam]; and Sulfa antibiotics   Review of Systems Review of Systems  Unable to perform ROS: Mental status change  Psychiatric/Behavioral: Positive for confusion.     Physical Exam Updated Vital Signs BP  (!) 99/57   Pulse (!) 57   Resp 14   SpO2 98%   Physical Exam  Constitutional: She appears well-developed and well-nourished.  HENT:  Head: Normocephalic  and atraumatic.  Eyes: Conjunctivae and EOM are normal. Pupils are equal, round, and reactive to light.  Neck: Neck supple.  Cardiovascular: Regular rhythm.  Borderline bradycardia  Pulmonary/Chest: Effort normal and breath sounds normal. No respiratory distress.  Abdominal: Soft. There is no tenderness.  Musculoskeletal: She exhibits edema (ble).  Neurological: She is alert.  Skin: Skin is warm and dry.  Psychiatric: She has a normal mood and affect.  Nursing note and vitals reviewed.    ED Treatments / Results  Labs (all labs ordered are listed, but only abnormal results are displayed) Labs Reviewed  COMPREHENSIVE METABOLIC PANEL  CBC  CBG MONITORING, ED  I-STAT CHEM 8, ED    EKG  EKG Interpretation  Date/Time:  Monday August 19 2017 19:42:56 EST Ventricular Rate:  56 PR Interval:    QRS Duration: 86 QT Interval:  465 QTC Calculation: 449 R Axis:   56 Text Interpretation:  Sinus rhythm Nonspecific T abnrm, anterolateral leads Borderline ECG Confirmed by Carmin Muskrat [phone]) on 08/19/2017 8:10:48 PM       Radiology No results found.  Procedures Procedures (including critical care time)  Medications Ordered in ED Medications  naloxone [facility]) injection 0.4 mg (0.4 mg Intravenous Given 08/19/17 1951)  sodium chloride 0.9 % bolus 500 mL (0 mLs Intravenous Stopped 08/19/17 1958)     Initial Impression / Assessment and Plan / ED Course  I have reviewed the triage vital signs and the nursing notes.  Pertinent labs & imaging results that were available during my care of the patient were reviewed by me and considered in my medical decision making (see chart for details).     Denise Sharp is a 72 year old female extensive past medical history who recently had rotator cuff repair.  She is currently at a  nursing/rehab facility.  She was given multiple medications this evening including baclofen, diazepam, Benadryl, carvedilol, Lunesta.  She became somnolent and partially responsive and her blood pressure was reportedly with systolics in the 50Y.  Patient is given Narcan and IV fluids.  Pressures are improved in the emergency department. Narcan was ineffective in changing mental status.  Labs obtained including UA, ammonia, i-STAT Chem-8, CMP, CBC.  Results are significant for mild leukocytosis, anemia at baseline, mildly elevated creatinine.  EKG obtained, personally reviewed by me, demonstrates normal sinus rhythm with nonspecific T wave abnormalities.  CT had obtained and demonstrates no acute intracranial abnormalities.  Chest x-ray obtained, personally reviewed by me, demonstrates no acute cardiac or pulmonary process.  Patient's mental status improved by did not full resolve.  Patient is admitted to the hospitalist for altered mental status.  Final Clinical Impressions(s) / ED Diagnoses   Final diagnoses:  Somnolence  Polypharmacy    ED Discharge Orders    None      Elveria Rising, MD 08/20/17 Duanne Limerick, MD 08/20/17 [phone]

## 2017-08-19 NOTE — ED Triage Notes (Signed)
Patient from [facility] and rehab for becoming unresponsive.  Patient alert to painful stimuli only.  BP 09L systolic for EMS.  Unable to give IV fluids due to no IV access.  Recent surgery to right shoulder.

## 2017-08-19 NOTE — ED Notes (Signed)
Patient much more alert, able to stay awake during conversation.  A&Ox4 at this time.  Made aware of need of urine, unable to go at this time.

## 2017-08-19 NOTE — ED Notes (Signed)
Narcan given and patient able to express pain in right shoulder.  Family at bedside with MD

## 2017-08-19 NOTE — ED Notes (Signed)
Unable to hear BP with manual BP.  EMT and RN attempted

## 2017-08-20 ENCOUNTER — Observation Stay (HOSPITAL_COMMUNITY): Payer: Medicare Other

## 2017-08-20 ENCOUNTER — Other Ambulatory Visit: Payer: Self-pay

## 2017-08-20 ENCOUNTER — Encounter (HOSPITAL_COMMUNITY): Payer: Medicare Other

## 2017-08-20 DIAGNOSIS — M6281 Muscle weakness (generalized): Secondary | ICD-10-CM | POA: Diagnosis not present

## 2017-08-20 DIAGNOSIS — E7800 Pure hypercholesterolemia, unspecified: Secondary | ICD-10-CM | POA: Diagnosis not present

## 2017-08-20 DIAGNOSIS — R400 Somnolence: Secondary | ICD-10-CM | POA: Diagnosis not present

## 2017-08-20 DIAGNOSIS — E1142 Type 2 diabetes mellitus with diabetic polyneuropathy: Secondary | ICD-10-CM | POA: Diagnosis present

## 2017-08-20 DIAGNOSIS — I2510 Atherosclerotic heart disease of native coronary artery without angina pectoris: Secondary | ICD-10-CM

## 2017-08-20 DIAGNOSIS — R4020 Unspecified coma: Secondary | ICD-10-CM | POA: Diagnosis not present

## 2017-08-20 DIAGNOSIS — I10 Essential (primary) hypertension: Secondary | ICD-10-CM | POA: Diagnosis not present

## 2017-08-20 DIAGNOSIS — R2681 Unsteadiness on feet: Secondary | ICD-10-CM | POA: Diagnosis not present

## 2017-08-20 DIAGNOSIS — Z7982 Long term (current) use of aspirin: Secondary | ICD-10-CM | POA: Diagnosis not present

## 2017-08-20 DIAGNOSIS — Z96653 Presence of artificial knee joint, bilateral: Secondary | ICD-10-CM | POA: Diagnosis present

## 2017-08-20 DIAGNOSIS — Z79899 Other long term (current) drug therapy: Secondary | ICD-10-CM | POA: Diagnosis not present

## 2017-08-20 DIAGNOSIS — M25511 Pain in right shoulder: Secondary | ICD-10-CM | POA: Diagnosis not present

## 2017-08-20 DIAGNOSIS — I959 Hypotension, unspecified: Secondary | ICD-10-CM | POA: Diagnosis present

## 2017-08-20 DIAGNOSIS — E785 Hyperlipidemia, unspecified: Secondary | ICD-10-CM | POA: Diagnosis present

## 2017-08-20 DIAGNOSIS — R4182 Altered mental status, unspecified: Secondary | ICD-10-CM | POA: Diagnosis present

## 2017-08-20 DIAGNOSIS — Z880 Allergy status to penicillin: Secondary | ICD-10-CM | POA: Diagnosis not present

## 2017-08-20 DIAGNOSIS — Z8249 Family history of ischemic heart disease and other diseases of the circulatory system: Secondary | ICD-10-CM | POA: Diagnosis not present

## 2017-08-20 DIAGNOSIS — Z881 Allergy status to other antibiotic agents status: Secondary | ICD-10-CM | POA: Diagnosis not present

## 2017-08-20 DIAGNOSIS — Z882 Allergy status to sulfonamides status: Secondary | ICD-10-CM | POA: Diagnosis not present

## 2017-08-20 DIAGNOSIS — Z7984 Long term (current) use of oral hypoglycemic drugs: Secondary | ICD-10-CM | POA: Diagnosis not present

## 2017-08-20 DIAGNOSIS — M75101 Unspecified rotator cuff tear or rupture of right shoulder, not specified as traumatic: Secondary | ICD-10-CM | POA: Diagnosis not present

## 2017-08-20 DIAGNOSIS — D500 Iron deficiency anemia secondary to blood loss (chronic): Secondary | ICD-10-CM | POA: Diagnosis not present

## 2017-08-20 DIAGNOSIS — G9341 Metabolic encephalopathy: Secondary | ICD-10-CM | POA: Diagnosis present

## 2017-08-20 DIAGNOSIS — E118 Type 2 diabetes mellitus with unspecified complications: Secondary | ICD-10-CM

## 2017-08-20 DIAGNOSIS — N179 Acute kidney failure, unspecified: Secondary | ICD-10-CM | POA: Diagnosis not present

## 2017-08-20 DIAGNOSIS — R55 Syncope and collapse: Secondary | ICD-10-CM | POA: Diagnosis not present

## 2017-08-20 DIAGNOSIS — R404 Transient alteration of awareness: Secondary | ICD-10-CM

## 2017-08-20 DIAGNOSIS — F419 Anxiety disorder, unspecified: Secondary | ICD-10-CM | POA: Diagnosis present

## 2017-08-20 DIAGNOSIS — E860 Dehydration: Secondary | ICD-10-CM | POA: Diagnosis present

## 2017-08-20 DIAGNOSIS — E11649 Type 2 diabetes mellitus with hypoglycemia without coma: Secondary | ICD-10-CM | POA: Diagnosis present

## 2017-08-20 DIAGNOSIS — G2581 Restless legs syndrome: Secondary | ICD-10-CM | POA: Diagnosis present

## 2017-08-20 DIAGNOSIS — R609 Edema, unspecified: Secondary | ICD-10-CM | POA: Diagnosis not present

## 2017-08-20 DIAGNOSIS — Z9884 Bariatric surgery status: Secondary | ICD-10-CM | POA: Diagnosis not present

## 2017-08-20 DIAGNOSIS — G4733 Obstructive sleep apnea (adult) (pediatric): Secondary | ICD-10-CM | POA: Diagnosis present

## 2017-08-20 DIAGNOSIS — Z5189 Encounter for other specified aftercare: Secondary | ICD-10-CM | POA: Diagnosis not present

## 2017-08-20 DIAGNOSIS — F5105 Insomnia due to other mental disorder: Secondary | ICD-10-CM | POA: Diagnosis present

## 2017-08-20 DIAGNOSIS — R410 Disorientation, unspecified: Secondary | ICD-10-CM | POA: Diagnosis present

## 2017-08-20 DIAGNOSIS — Z90710 Acquired absence of both cervix and uterus: Secondary | ICD-10-CM | POA: Diagnosis not present

## 2017-08-20 DIAGNOSIS — Z96611 Presence of right artificial shoulder joint: Secondary | ICD-10-CM | POA: Diagnosis present

## 2017-08-20 DIAGNOSIS — R7989 Other specified abnormal findings of blood chemistry: Secondary | ICD-10-CM | POA: Diagnosis not present

## 2017-08-20 LAB — CBC
HCT: 32.6 % — ABNORMAL LOW (ref 36.0–46.0)
Hemoglobin: 10.2 g/dL — ABNORMAL LOW (ref 12.0–15.0)
MCH: 29 pg (ref 26.0–34.0)
MCHC: 31.3 g/dL (ref 30.0–36.0)
MCV: 92.6 fL (ref 78.0–100.0)
Platelets: 326 10*3/uL (ref 150–400)
RBC: 3.52 MIL/uL — ABNORMAL LOW (ref 3.87–5.11)
RDW: 14.4 % (ref 11.5–15.5)
WBC: 10.1 10*3/uL (ref 4.0–10.5)

## 2017-08-20 LAB — GLUCOSE, CAPILLARY
Glucose-Capillary: 166 mg/dL — ABNORMAL HIGH (ref 65–99)
Glucose-Capillary: 135 mg/dL — ABNORMAL HIGH (ref 65–99)
Glucose-Capillary: 98 mg/dL (ref 65–99)
Glucose-Capillary: 65 mg/dL (ref 65–99)
Glucose-Capillary: 93 mg/dL (ref 65–99)
Glucose-Capillary: 79 mg/dL (ref 65–99)

## 2017-08-20 LAB — COMPREHENSIVE METABOLIC PANEL
ALT: 11 U/L — ABNORMAL LOW (ref 14–54)
AST: 17 U/L (ref 15–41)
Albumin: 2.7 g/dL — ABNORMAL LOW (ref 3.5–5.0)
Alkaline Phosphatase: 79 U/L (ref 38–126)
Anion gap: 13 (ref 5–15)
BUN: 16 mg/dL (ref 6–20)
CO2: 23 mmol/L (ref 22–32)
Calcium: 8.5 mg/dL — ABNORMAL LOW (ref 8.9–10.3)
Chloride: 104 mmol/L (ref 101–111)
Creatinine, Ser: 1.07 mg/dL — ABNORMAL HIGH (ref 0.44–1.00)
GFR calc Af Amer: 59 mL/min — ABNORMAL LOW (ref >60)
GFR calc non Af Amer: 51 mL/min — ABNORMAL LOW (ref >60)
Glucose, Bld: 129 mg/dL — ABNORMAL HIGH (ref 65–99)
Potassium: 4.5 mmol/L (ref 3.5–5.1)
Sodium: 140 mmol/L (ref 135–145)
Total Bilirubin: 0.7 mg/dL (ref 0.3–1.2)
Total Protein: 6.2 g/dL — ABNORMAL LOW (ref 6.5–8.1)

## 2017-08-20 LAB — TROPONIN I
Troponin I: <0.03 ng/mL (ref <0.03)
Troponin I: <0.03 ng/mL (ref <0.03)
Troponin I: <0.03 ng/mL (ref <0.03)

## 2017-08-20 LAB — CREATININE, SERUM
Creatinine, Ser: 1.08 mg/dL — ABNORMAL HIGH (ref 0.44–1.00)
GFR calc Af Amer: 58 mL/min — ABNORMAL LOW (ref >60)
GFR calc non Af Amer: 50 mL/min — ABNORMAL LOW (ref >60)

## 2017-08-20 LAB — D-DIMER, QUANTITATIVE: D-Dimer, Quant: 3.88 ug/mL-FEU — ABNORMAL HIGH (ref 0.00–0.50)

## 2017-08-20 LAB — TSH: TSH: 2.279 u[IU]/mL (ref 0.350–4.500)

## 2017-08-20 LAB — CBG MONITORING, ED: Glucose-Capillary: 119 mg/dL — ABNORMAL HIGH (ref 65–99)

## 2017-08-20 MED ORDER — ALBUTEROL SULFATE (2.5 MG/3ML) 0.083% IN NEBU: 2.5000 mg | INHALATION_SOLUTION | Freq: Three times a day (TID) | RESPIRATORY_TRACT | Status: DC | PRN

## 2017-08-20 MED ORDER — ONDANSETRON HCL 4 MG/2ML IJ SOLN: 4.0000 mg | Freq: Four times a day (QID) | INTRAMUSCULAR | Status: DC | PRN

## 2017-08-20 MED ORDER — ACETAMINOPHEN 325 MG PO TABS: 650.0000 mg | ORAL_TABLET | Freq: Four times a day (QID) | ORAL | Status: DC | PRN

## 2017-08-20 MED ORDER — INSULIN ASPART 100 UNIT/ML ~~LOC~~ SOLN
0.0000 [IU] | Freq: Three times a day (TID) | SUBCUTANEOUS | Status: DC
  Administered 2017-08-20 – 2017-08-21 (×2): 1 [IU] via SUBCUTANEOUS

## 2017-08-20 MED ORDER — ONDANSETRON HCL 4 MG PO TABS: 4.0000 mg | ORAL_TABLET | Freq: Four times a day (QID) | ORAL | Status: DC | PRN

## 2017-08-20 MED ORDER — ACETAMINOPHEN 650 MG RE SUPP: 650.0000 mg | Freq: Four times a day (QID) | RECTAL | Status: DC | PRN

## 2017-08-20 MED ORDER — TRAMADOL HCL 50 MG PO TABS: 50.0000 mg | ORAL_TABLET | Freq: Every day | ORAL | Status: DC | PRN

## 2017-08-20 MED ORDER — TRAMADOL HCL 50 MG PO TABS: 25.0000 mg | ORAL_TABLET | Freq: Three times a day (TID) | ORAL | Status: DC | PRN

## 2017-08-20 MED ORDER — HEPARIN SODIUM (PORCINE) 5000 UNIT/ML IJ SOLN
5000.0000 [IU] | Freq: Three times a day (TID) | INTRAMUSCULAR | Status: DC
  Administered 2017-08-20 – 2017-08-21 (×4): 5000 [IU] via SUBCUTANEOUS
  Filled 2017-08-20 (×3): qty 1

## 2017-08-20 MED ORDER — ROSUVASTATIN CALCIUM 5 MG PO TABS
10.0000 mg | ORAL_TABLET | Freq: Every day | ORAL | Status: DC
  Administered 2017-08-20 – 2017-08-21 (×2): 10 mg via ORAL
  Filled 2017-08-20 (×2): qty 2

## 2017-08-20 MED ORDER — SODIUM CHLORIDE 0.9 % IV SOLN
INTRAVENOUS | Status: DC
  Administered 2017-08-20 – 2017-08-21 (×2): via INTRAVENOUS

## 2017-08-20 MED ORDER — IOPAMIDOL (ISOVUE-370) INJECTION 76%
INTRAVENOUS | Status: AC
  Administered 2017-08-20: 100 mL
  Filled 2017-08-20: qty 100

## 2017-08-20 MED ORDER — DEXTROSE-NACL 5-0.45 % IV SOLN
INTRAVENOUS | Status: DC
  Administered 2017-08-20: 02:00:00 via INTRAVENOUS

## 2017-08-20 MED ORDER — TRAMADOL HCL 50 MG PO TABS
25.0000 mg | ORAL_TABLET | Freq: Two times a day (BID) | ORAL | Status: DC | PRN
Start: 2017-08-20 — End: 2017-08-22
  Administered 2017-08-20 – 2017-08-21 (×2): 25 mg via ORAL
  Filled 2017-08-20 (×2): qty 1

## 2017-08-20 MED ORDER — LEVETIRACETAM 500 MG PO TABS
500.0000 mg | ORAL_TABLET | Freq: Two times a day (BID) | ORAL | Status: DC
  Administered 2017-08-20 – 2017-08-21 (×4): 500 mg via ORAL
  Filled 2017-08-20 (×4): qty 1

## 2017-08-20 MED ORDER — ASPIRIN EC 81 MG PO TBEC
81.0000 mg | DELAYED_RELEASE_TABLET | Freq: Every day | ORAL | Status: DC
  Administered 2017-08-20 – 2017-08-21 (×2): 81 mg via ORAL
  Filled 2017-08-20 (×2): qty 1

## 2017-08-20 MED ORDER — INSULIN ASPART 100 UNIT/ML ~~LOC~~ SOLN: 0.0000 [IU] | Freq: Every day | SUBCUTANEOUS | Status: DC

## 2017-08-20 NOTE — Care Management Note (Signed)
Case Management Note  Patient Details  Name: Denise Sharp MRN: [id number] Date of Birth: [birth year]  Subjective/Objective:      Pt in with altered mental status. She is from [facility] for rehab. Pt is s/p recent shoulder surgery.              Action/Plan: Plan is for patient to return to [facility] when medically stable. CM following.  Expected Discharge Date:                  Expected Discharge Plan:  Skilled Nursing Facility  In-House Referral:  Clinical Social Work  Discharge planning Services     Post Acute Care Choice:    Choice offered to:     DME Arranged:    DME Agency:     HH Arranged:    [place] Agency:     Status of Service:  In process, will continue to follow  If discussed at Long Length of Stay Meetings, dates discussed:    Additional Comments:  Pollie Friar, RN 08/20/2017, 12:28 PM

## 2017-08-20 NOTE — Progress Notes (Signed)
RN notified RT of patient order for CPAP at this time.  Ave Filter, RN

## 2017-08-20 NOTE — H&P (Addendum)
History and Physical    [name] SNK:[id number] DOB: [birth year] DOA: 08/19/2017  PCP: [name], MD Patient coming from: Nursing home  Chief Complaint: Altered mental status and hypotension  HPI: Denise Sharp is a 72 y.o. female with medical history significant of diabetes mellitus type 2, iron deficiency anemia, hypertension, hyperlipidemia, obstructive sleep apnea, coronary artery disease was sent to the hospital from [facility] rehab for unresponsiveness and low blood pressure.  History is somewhat unclear as patient does not know much but she is able to tell me that earlier in the evening she was complaining of some right shoulder pain therefore received some pain medication followed by her routine home medications.  Soon after she became drowsy.  According to the report she became unresponsive and her systolic blood pressure was in 50s therefore brought to the ER for further evaluation. Patient recently had reversal of the right shoulder arthroplasty about 4 5 days ago and has been at the rehab since then.  In the ER patient's blood pressure improved with IV fluid along with her mentation.  She was somnolent at first but was responsive to most of the questions.  CT of the head is negative.  No obvious source of infection.  According to the husband who is at bedside her mentation has improved but not yet back at baseline therefore will admit the patient for observation.  Patient thinks certain medications were increased recently but unable to tell me which ones.   Review of Systems: As per HPI otherwise 10 point review of systems negative.   Past Medical History:  Diagnosis Date  . Abnormality of gait 03/25/2013  . Adrenal mass, left ([place])   . Anemia    iron deficient post  2 unit txfsn 2009, normal endo/colonoscopy by [facility]  . Arthritis   . Atypical chest pain    a. 04/1986 Cath ([facility]): nl cors, EF 65%; b. 07/2012 Cath [facility]): LM nl, LAD/LCX/RCA w/ min irregs;  c. 07/2016 [facility]): "Equivocal" w/ small inf wall rev defect and hyperdynamic LV contraction, EF 87%; d. 08/2016 Cardiac CT Ca2+ score [facility]): Ca2+ score 1548.  [name] Cervical spinal stenosis 1994   due to trauma to back ([employer] accident), has intermittent paralysis and parasthesias  . Cervicogenic headache 03/23/2014  . Depression   . Dizziness    chronic dizziness  . DJD (degenerative joint disease)    a. Chronic R shoulder pain pending R shoulder replacement 07/2017.  [name] Esophageal stenosis March 2011   with transietn outlet obstruction by food, cleared by EGD   . Family history of adverse reaction to anesthesia    daughter PONV  . Gastric bypass status for obesity   . Headache(784.0)   . Hypertension   . IBS (irritable bowel syndrome)   . Left bundle branch block (LBBB)    a. Intermittently present - likely rate related. - patient denies  . Obesity   . Obstructive sleep apnea    using CPAP  . 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 Restless leg syndrome   . Rotator cuff arthropathy, right 08/13/2017  . Syncope and collapse 03/12/2014  . Type II diabetes mellitus ([place])     Past Surgical History:  Procedure Laterality Date  . ABDOMINAL HYSTERECTOMY    . APPENDECTOMY    . COLONOSCOPY    . DIAPHRAGMATIC HERNIA REPAIR  2015  . ESOPHAGEAL DILATION     multiple  . ESOPHAGOGASTRODUODENOSCOPY    . [facility]  .  GALLBLADDER SURGERY  resection  . GASTRIC BYPASS    . GASTRIC BYPASS  2000, 2005   Dr. Debroah Loop  . [facility] IMPLANT PLACEMENT  April 2013   [place]  . JOINT REPLACEMENT  2007   bilateral knee. [name],  [name]  . REVERSE SHOULDER ARTHROPLASTY Right 08/13/2017   Procedure: REVERSE RIGHT SHOULDER ARTHROPLASTY;  Surgeon: [name], MD;  Location: [place];  Service: Orthopedics;  Laterality: Right;  . ROTATOR CUFF REPAIR     right  . [place]  . TOTAL ABDOMINAL HYSTERECTOMY W/ BILATERAL SALPINGOOPHORECTOMY  1974  . UMBILICAL HERNIA  REPAIR  Aug 11, 2015     reports that  has never smoked. she has never used smokeless tobacco. She reports that she does not drink alcohol or use drugs.  Allergies  Allergen Reactions  . Amoxicillin Diarrhea and Other (See Comments)    .PATIENT HAS HAD A PCN REACTION WITH IMMEDIATE RASH, FACIAL/TONGUE/THROAT SWELLING, SOB, OR LIGHTHEADEDNESS WITH HYPOTENSION:  #  #  #  YES  #  #  #   Has patient had a PCN reaction causing severe rash involving mucus membranes or skin necrosis: No Has patient had a PCN reaction that required hospitalization: No Has patient had a PCN reaction occurring within the last 10 years: #  #  #  YES  #  #  #  TOLERATED cefazolin pre-op 08/13/17  . Demeclocycline Hives  . Erythromycin Nausea And Vomiting and Other (See Comments)    Severe irritable bowel  . Flagyl [Metronidazole] Nausea And Vomiting and Other (See Comments)    Severe irritable bowel  . Glucophage [Metformin Hcl] Nausea And Vomiting and Other (See Comments)    "Sick"  . Tetracyclines & Related Hives and Rash  . Biaxin [Clarithromycin] Nausea And Vomiting and Other (See Comments)    Severe irritable bowel  . Diovan [Valsartan]     UNSPECIFIED REACTION   . Xanax [Alprazolam]     UNSPECIFIED REACTION   . Sulfa Antibiotics Rash and Other (See Comments)    As child    Family History  Problem Relation Age of Onset  . Heart disease Father   . Hypertension Father   . Prostate cancer Father   . Stroke Father   . Osteoporosis Father   . Stroke Mother   . Depression Mother   . Headache Mother   . Heart disease Mother   . Thyroid disease Mother   . Hypertension Mother   . Diabetes Daughter   . Heart disease Daughter   . Hypertension Daughter   . Hypertension Son      Prior to Admission medications   Medication Sig Start Date End Date Taking? Authorizing Provider  albuterol (PROVENTIL HFA;VENTOLIN HFA) 108 (90 Base) MCG/ACT inhaler Inhale 2 puffs into the lungs every 8 (eight) hours as  needed for wheezing or shortness of breath. 05/29/16   Burnard Hawthorne, FNP  amLODipine (NORVASC) 5 MG tablet Take 1 tablet (5 mg total) by mouth daily. 03/06/17   End, Harrell Gave, MD  aspirin EC 81 MG tablet Take 1 tablet (81 mg total) by mouth daily. 03/06/17   End, Harrell Gave, MD  baclofen (LIORESAL) 10 MG tablet Take 1 tablet (10 mg total) by mouth 3 (three) times daily. As needed for muscle spasm 08/13/17   [name], MD  benzonatate (TESSALON) 200 MG capsule Take 200 mg by mouth 3 (three) times daily as needed for cough.    [provider]  butalbital-acetaminophen-caffeine (FIORICET, ESGIC) 50-325-40 MG tablet Take 1 tablet by mouth every 6 (six) hours as needed for headache. 03/05/17   [name], MD  carvedilol (COREG) 3.125 MG tablet Take 1 tablet (3.125 mg total) by mouth 2 (two) times daily. 03/06/17 07/22/17  End, Harrell Gave, MD  Cholecalciferol (VITAMIN D PO) Take 5,000 Units by mouth every other day.     [provider]  diazepam (VALIUM) 10 MG tablet Take 1 tablet (10 mg total) by mouth at bedtime. 1 TABLET DAILY AS NEEDED FOR ANXIETY OR INSOMNIA Patient taking differently: Take 10 mg by mouth at bedtime.  04/23/17   [name], MD  diphenhydrAMINE (BENADRYL) 25 MG tablet Take 25 mg by mouth every 6 (six) hours as needed for itching.     [provider]  DULoxetine (CYMBALTA) 30 MG capsule Take 1 capsule (30 mg total) by mouth 2 (two) times daily. 07/31/17   Kathrynn Ducking, MD  fluconazole (DIFLUCAN) 150 MG tablet Take 1 tablet (150 mg total) by mouth daily. 08/08/17   [name], MD  furosemide (LASIX) 20 MG tablet Take 20 mg by mouth daily as needed for fluid.    [provider]  glipiZIDE (GLUCOTROL) 5 MG tablet Take 1 tablet (5 mg total) by mouth 2 (two) times daily before a meal. 08/02/17   [name], MD  HYDROcodone-acetaminophen (NORCO) 10-325 MG tablet Take 1-2 tablets by mouth every 6 (six) hours as needed. 08/13/17    [name], MD  levETIRAcetam (KEPPRA) 500 MG tablet Take 1 tablet (500 mg total) by mouth 2 (two) times daily. 08/08/17   Kathrynn Ducking, MD  LUNESTA 2 MG TABS tablet Take 2 mg immediately before bedtime 07/23/17   [name], MD  ondansetron (ZOFRAN) 4 MG tablet Take 1 tablet (4 mg total) by mouth every 8 (eight) hours as needed for nausea or vomiting. 08/13/17   [name], MD  pregabalin (LYRICA) 100 MG capsule Take 1 capsule (100 mg total) by mouth 2 (two) times daily. Patient taking differently: Take 200 mg by mouth at bedtime.  04/23/17   [name], MD  rOPINIRole (REQUIP) 1 MG tablet Take 1 tablet (1 mg total) by mouth at bedtime. 08/15/16   [name], MD  rosuvastatin (CRESTOR) 10 MG tablet Take 1 tablet (10 mg total) by mouth daily. 03/06/17   End, Harrell Gave, MD  sennosides-docusate sodium (SENOKOT-S) 8.6-50 MG tablet Take 2 tablets by mouth daily. 08/13/17   [name], MD  sitaGLIPtin (JANUVIA) 100 MG tablet Take 1 tablet (100 mg total) by mouth daily. 02/13/17   [name], MD  tizanidine (ZANAFLEX) 2 MG capsule Take 2 mg by mouth 3 (three) times daily as needed for muscle spasms.     [provider]  traMADol (ULTRAM) 50 MG tablet Take 1 tablet (50 mg total) by mouth daily as needed. Patient taking differently: Take 50 mg by mouth daily as needed for moderate pain.  04/23/17   [name], MD  Zinc Oxide (BOUDREAUXS BUTT PASTE EX) Apply 1 application topically at bedtime.    [provider]    Physical Exam: Vitals:   08/19/17 2215 08/19/17 2245 08/19/17 2315 08/19/17 2330  BP: 112/76 101/61 127/73 117/74  Pulse: (!) 59 60 62 (!) 59  Resp: 14 15 16 16   SpO2: 98% 99% 98% 97%      Constitutional: NAD, calm, comfortable, drowsy Vitals:   08/19/17 2215 08/19/17 2245 08/19/17  2315 08/19/17 2330  BP: 112/76 101/61 127/73 117/74  Pulse: (!) 59 60 62 (!) 59  Resp: 14 15 16 16   SpO2: 98% 99% 98% 97%   Eyes: PERRL, lids and  conjunctivae normal ENMT: Mucous membranes are dry posterior pharynx clear of any exudate or lesions.Normal dentition.  Neck: normal, supple, no masses, no thyromegaly Respiratory: clear to auscultation bilaterally, no wheezing, no crackles. Normal respiratory effort. No accessory muscle use.  Cardiovascular: Regular rate and rhythm, no murmurs / rubs / gallops. No extremity edema. 2+ pedal pulses. No carotid bruits.  Abdomen: no tenderness, no masses palpated. No hepatosplenomegaly. Bowel sounds positive.  Musculoskeletal: no clubbing / cyanosis. No joint deformity upper and lower extremities. Good ROM, no contractures. Normal muscle tone.  Skin: no rashes, lesions, ulcers. No induration Neurologic: CN 2-12 grossly intact. Sensation intact, DTR normal. Strength 5/5 in all 4.  Psychiatric: Normal judgment and insight. Alert and oriented x 2 to name and place. Normal mood.     Labs on Admission: I have personally reviewed following labs and imaging studies  CBC: Recent Labs  Lab 08/14/17 0603 08/19/17 1946 08/19/17 2001  WBC 13.1* 12.4*  --   HGB 10.6* 10.6* 10.5*  HCT 33.5* 33.1* 31.0*  MCV 92.8 90.9  --   PLT 276 317  --    Basic Metabolic Panel: Recent Labs  Lab 08/14/17 0603 08/19/17 1946 08/19/17 2001  NA 138 138 137  K 4.3 4.4 4.3  CL 105 103 101  CO2 22 22  --   GLUCOSE 141* 184* 176*  BUN 17 15 15   CREATININE 1.00 1.36* 1.20*  CALCIUM 8.4* 8.5*  --    GFR: Estimated Creatinine Clearance: 53.1 mL/min (A) (by C-G formula based on SCr of 1.2 mg/dL (H)). Liver Function Tests: Recent Labs  Lab 08/19/17 1946  AST 19  ALT 12*  ALKPHOS 81  BILITOT 0.6  PROT 6.2*  ALBUMIN 2.8*   No results for input(s): LIPASE, AMYLASE in the last 168 hours. Recent Labs  Lab 08/19/17 2013  AMMONIA <9*   Coagulation Profile: No results for input(s): INR, PROTIME in the last 168 hours. Cardiac Enzymes: No results for input(s): CKTOTAL, CKMB, CKMBINDEX, TROPONINI in the last  168 hours. BNP (last 3 results) No results for input(s): PROBNP in the last 8760 hours. HbA1C: No results for input(s): HGBA1C in the last 72 hours. CBG: Recent Labs  Lab 08/13/17 2211 08/14/17 0657 08/14/17 1221 08/14/17 1614 08/15/17 1100  GLUCAP 389* 128* 110* 133* 152*   Lipid Profile: No results for input(s): CHOL, HDL, LDLCALC, TRIG, CHOLHDL, LDLDIRECT in the last 72 hours. Thyroid Function Tests: No results for input(s): TSH, T4TOTAL, FREET4, T3FREE, THYROIDAB in the last 72 hours. Anemia Panel: No results for input(s): VITAMINB12, FOLATE, FERRITIN, TIBC, IRON, RETICCTPCT in the last 72 hours. Urine analysis:    Component Value Date/Time   COLORURINE YELLOW 08/19/2017 2228   APPEARANCEUR HAZY (A) 08/19/2017 2228   APPEARANCEUR Clear 11/11/2014 1602   LABSPEC 1.015 08/19/2017 2228   LABSPEC 1.006 06/21/2014 1623   PHURINE 5.0 08/19/2017 2228   GLUCOSEU NEGATIVE 08/19/2017 2228   GLUCOSEU Negative 06/21/2014 1623   HGBUR NEGATIVE 08/19/2017 2228   BILIRUBINUR NEGATIVE 08/19/2017 2228   BILIRUBINUR Negative 02/01/2016 1507   BILIRUBINUR Negative 11/11/2014 1602   BILIRUBINUR Negative 06/21/2014 1623   KETONESUR NEGATIVE 08/19/2017 2228   PROTEINUR NEGATIVE 08/19/2017 2228   UROBILINOGEN 0.2 02/01/2016 1507   NITRITE NEGATIVE 08/19/2017 2228   LEUKOCYTESUR NEGATIVE  08/19/2017 2228   LEUKOCYTESUR Negative 11/11/2014 1602   LEUKOCYTESUR Negative 06/21/2014 1623   Sepsis Labs: !!!!!!!!!!!!!!!!!!!!!!!!!!!!!!!!!!!!!!!!!!!! @LABRCNTIP (procalcitonin:4,lacticidven:4) )No results found for this or any previous visit (from the past 240 hour(s)).   Radiological Exams on Admission: Ct Head Wo Contrast  Result Date: 08/19/2017 CLINICAL DATA:  Sudden mental status changes, currently unresponsive. EXAM: CT HEAD WITHOUT CONTRAST TECHNIQUE: Contiguous axial images were obtained from the base of the skull through the vertex without intravenous contrast. COMPARISON:  02/17/2017  FINDINGS: Brain: Age related atrophy. Chronic appearing small vessel ischemic changes the hemispheric white matter. No sign of acute infarction, mass lesion, hemorrhage, hydrocephalus or extra-axial collection. Vascular: There is atherosclerotic calcification of the major vessels at the base of the brain. Skull: Normal Sinuses/Orbits: Clear/normal Other: None IMPRESSION: No acute finding by CT. Age related atrophy and mild chronic small-vessel ischemic change of the white matter. Electronically Signed   By: Nelson Chimes M.D.   On: 08/19/2017 20:41   Dg Chest Portable 1 View  Result Date: 08/19/2017 CLINICAL DATA:  Recent shoulder surgery. Altered mental status today. EXAM: PORTABLE CHEST 1 VIEW COMPARISON:  06/13/2017. FINDINGS: Poor inspiration. Allowing for that, the heart and mediastinum are normal in the lungs are clear. No collapse or visible infiltrate. Reverse shoulder replacement on the right. IMPRESSION: Poor inspiration.  No active disease suspected. Electronically Signed   By: Nelson Chimes M.D.   On: 08/19/2017 20:35    EKG: Independently reviewed.  Assessment/Plan Active Problems:   Altered mental status   Acute metabolic encephalopathy -Admit the patient for observation; likely in the setting of polypharmacy causing hypotension in the setting of acute kidney injury -Patient is currently drowsy, AAOX2 (name and place) but at least line is AAOx3 -CT of the head is negative, no obvious signs of any infection therefore hold off on antibiotics - Periodic neuro checks, IV fluids, monitor urine output -Provide supportive care -We will hold off on certain p.o. medication especially sedatives  Hypotension - Likely secondary to polypharmacy.  Getting IV fluids, blood pressure already better  Acute kidney injury -Likely secondary to hypotension and dehydration.  Creatinine of 1.5 which is elevated from her baseline of 1.0 -Blood pressure is better, continue IV fluids and monitor kidney  function  Diabetes mellitus type 2 -Hold off on p.o. medications, continue Accu-Chek and sliding scale  Essential hypertension -Holding off on antihypertensives  Hyperlipidemia -Continue statin  Polyneuropathy -Holding off on multiple neuro meds given her mentation.  This can be slowly restarted when appropriate  Obstructive sleep apnea -We will order CPAP  Status post recent right rotator cuff tear repair -Pain control, avoid narcotics or any sedating medication.  Will use Tylenol for now, tramadol if necessary  CAD -Stable.  Continue aspirin and statin.  Holding off on anti-hypertensives    DVT prophylaxis: SCDs Code Status: Full  Family Communication: Husband at bedside  Disposition Plan:  TBD Consults called: None Admission status: Obs   Zhavia Cunanan Arsenio Loader MD [facility] Pager [phone]  If 7PM-7AM, please contact night-coverage [email or web address] Password [email or web address]  08/20/2017, 12:08 AM

## 2017-08-20 NOTE — Progress Notes (Signed)
Hypoglycemic Event  CBG: 65   Treatment: 15 GM carbohydrate snack  Symptoms: None  Follow-up CBG: Time:1756 CBG Result:93  Possible Reasons for Event: Unknown  Comments/MD notified: not this time, RN folow hypoglycemia protocol.     Sanjuana Mae

## 2017-08-20 NOTE — Progress Notes (Signed)
RN will place TELE once pt return from Xray.  Ave Filter, RN

## 2017-08-20 NOTE — Progress Notes (Signed)
CSW following for discharge plan. Patient is a recent readmit, with full assessment completed on 08/15/17. Patient discharged to [facility] for SNF. CSW to follow up with patient and family to determine discharge plan.  Laveda Abbe, Time Clinical Social Worker [phone]

## 2017-08-20 NOTE — ED Notes (Signed)
Patient is stable and ready to be transport to the floor at this time.  Report was called to 3W RN.  Belongings taken with the patient to the floor.   

## 2017-08-20 NOTE — Progress Notes (Addendum)
Assisted with set up of patients home CPAP, mask, tubing. Equipment has no visible  frayed wires. Placed patient on via nasal mask. (previously set home settings). Patient states she is able to place herself on/off as needed. Machine is within reach.

## 2017-08-20 NOTE — Progress Notes (Signed)
PROGRESS NOTE    Denise Sharp  TIW:[id number] DOB: 05-02-1946 DOA: 08/19/2017 PCP: [name], MD   Brief Narrative: Denise Sharp is a 72 y.o. female with medical history significant of diabetes mellitus type 2, iron deficiency anemia, hypertension, hyperlipidemia, obstructive sleep apnea, coronary artery disease was sent to the hospital from [facility] and rehab for unresponsiveness and low blood pressure.  History is somewhat unclear as patient does not know much but she is able to tell me that earlier in the evening she was complaining of some right shoulder pain therefore received some pain medication followed by her routine home medications.  Soon after she became drowsy.  According to the report she became unresponsive and her systolic blood pressure was in 50s therefore brought to the ER for further evaluation. Patient recently had reversal of the right shoulder arthroplasty about 4 5 days ago and has been at the rehab since then.  In the ER patient's blood pressure improved with IV fluid along with her mentation.  She was somnolent at first but was responsive to most of the questions.  CT of the head is negative.  No obvious source of infection.  According to the husband who is at bedside her mentation has improved but not yet back at baseline therefore will admit the patient for observation.  Patient thinks certain medications were increased recently but unable to tell me which ones     Assessment & Plan:   Active Problems:   Altered mental status  1-Acute encephalopathy;  Could be related to multiples medications, (baclofen, valium, benadryl, lunesta, lyrica, requip).  Improving. Still mildly confuse per husband.  CT head negative.  Will resume low dose valium PRN at HS< on=bserved.   2-Hypotension. ? Syncope;  D dimer elevated check CT angio and doppler.  Hold BP medication.  Continue with IV fluids.   AKI; mild. Improved with IV fluids.   DM; SSI.    Hyperlipidemia -Continue statin  Polyneuropathy -Holding off on multiple neuro meds given her mentation.  This can be slowly restarted when appropriate  Obstructive sleep apnea - CPAP  Status post recent right rotator cuff tear repair -Pain control, avoid narcotics or any sedating medication.   Low dose tramadol. Observed.  X ray.  Ask ortho to evaluate, per patient request   CAD -Stable.  Continue aspirin and statin.  Holding off on anti-hypertensives       DVT prophylaxis: heparin  Code Status: full code.  Family Communication: care discussed with husband.  Disposition Plan: needs SNF Consultants:   ortho   Procedures:      Antimicrobials:   none   Subjective: She is alert, she knows she is in the hospital. She think she got all her medications together yesterday.  Husband at bed side think that patient received all her medications lunch and night dose together.    Objective: Vitals:   08/20/17 0409 08/20/17 0430 08/20/17 0644 08/20/17 1125  BP:  115/61 (!) 109/53 (!) 107/53  Pulse:  (!) 54 (!) 58 64  Resp:  16 18 20   Temp: 98.5 F (36.9 C) 97.9 F (36.6 C) 97.8 F (36.6 C) 98.1 F (36.7 C)  TempSrc: Oral Oral Oral Oral  SpO2:  99% 96% 99%  Weight:      Height:        Intake/Output Summary (Last 24 hours) at 08/20/2017 1538 Last data filed at 08/20/2017 0830 Gross per 24 hour  Intake 1424.58 ml  Output -  Net 1424.58 ml   Filed Weights   08/20/17 0314  Weight: 115 kg (253 lb 8.5 oz)    Examination:  General exam: Appears calm and comfortable , right shoulder sling.  Respiratory system: Clear to auscultation. Respiratory effort normal. Cardiovascular system: S1 & S2 heard, RRR. No JVD, murmurs, rubs, gallops or clicks. No pedal edema. Gastrointestinal system: Abdomen is nondistended, soft and nontender. No organomegaly or masses felt. Normal bowel sounds heard. Central nervous system: Alert and oriented. No focal neurological  deficits. Extremities: Symmetric 5 x 5 power. Skin: No rashes, lesions or ulcers Psychiatry: Judgement and insight appear normal. Mood & affect appropriate.     Data Reviewed: I have personally reviewed following labs and imaging studies  CBC: Recent Labs  Lab 08/14/17 0603 08/19/17 1946 08/19/17 2001 08/20/17 0501  WBC 13.1* 12.4*  --  10.1  HGB 10.6* 10.6* 10.5* 10.2*  HCT 33.5* 33.1* 31.0* 32.6*  MCV 92.8 90.9  --  92.6  PLT 276 317  --  841   Basic Metabolic Panel: Recent Labs  Lab 08/14/17 0603 08/19/17 1946 08/19/17 2001 08/20/17 0501  NA 138 138 137 140  K 4.3 4.4 4.3 4.5  CL 105 103 101 104  CO2 22 22  --  23  GLUCOSE 141* 184* 176* 129*  BUN 17 15 15 16   CREATININE 1.00 1.36* 1.20* 1.07*  1.08*  CALCIUM 8.4* 8.5*  --  8.5*   GFR: Estimated Creatinine Clearance: 59.4 mL/min (A) (by C-G formula based on SCr of 1.08 mg/dL (H)). Liver Function Tests: Recent Labs  Lab 08/19/17 1946 08/20/17 0501  AST 19 17  ALT 12* 11*  ALKPHOS 81 79  BILITOT 0.6 0.7  PROT 6.2* 6.2*  ALBUMIN 2.8* 2.7*   No results for input(s): LIPASE, AMYLASE in the last 168 hours. Recent Labs  Lab 08/19/17 2013  AMMONIA <9*   Coagulation Profile: No results for input(s): INR, PROTIME in the last 168 hours. Cardiac Enzymes: Recent Labs  Lab 08/20/17 0951  TROPONINI <0.03   BNP (last 3 results) No results for input(s): PROBNP in the last 8760 hours. HbA1C: No results for input(s): HGBA1C in the last 72 hours. CBG: Recent Labs  Lab 08/15/17 1100 08/19/17 1938 08/20/17 0220 08/20/17 0733 08/20/17 1120  GLUCAP 152* 166* 119* 135* 98   Lipid Profile: No results for input(s): CHOL, HDL, LDLCALC, TRIG, CHOLHDL, LDLDIRECT in the last 72 hours. Thyroid Function Tests: Recent Labs    08/20/17 0501  TSH 2.279   Anemia Panel: No results for input(s): VITAMINB12, FOLATE, FERRITIN, TIBC, IRON, RETICCTPCT in the last 72 hours. Sepsis Labs: No results for input(s):  PROCALCITON, LATICACIDVEN in the last 168 hours.  No results found for this or any previous visit (from the past 240 hour(s)).       Radiology Studies: Dg Shoulder Right  Result Date: 08/20/2017 CLINICAL DATA:  Right shoulder pain.  Fall EXAM: RIGHT SHOULDER - 2+ VIEW COMPARISON:  08/13/2017 FINDINGS: Prior right shoulder replacement. No hardware complicating feature or acute bony abnormality. No fracture, subluxation or dislocation. IMPRESSION: Prior right shoulder replacement.  No acute findings. Electronically Signed   By: Rolm Baptise M.D.   On: 08/20/2017 11:13   Ct Head Wo Contrast  Result Date: 08/19/2017 CLINICAL DATA:  Sudden mental status changes, currently unresponsive. EXAM: CT HEAD WITHOUT CONTRAST TECHNIQUE: Contiguous axial images were obtained from the base of the skull through the vertex without intravenous contrast. COMPARISON:  02/17/2017 FINDINGS: Brain: Age related atrophy.  Chronic appearing small vessel ischemic changes the hemispheric white matter. No sign of acute infarction, mass lesion, hemorrhage, hydrocephalus or extra-axial collection. Vascular: There is atherosclerotic calcification of the major vessels at the base of the brain. Skull: Normal Sinuses/Orbits: Clear/normal Other: None IMPRESSION: No acute finding by CT. Age related atrophy and mild chronic small-vessel ischemic change of the white matter. Electronically Signed   By: Nelson Chimes M.D.   On: 08/19/2017 20:41   Dg Chest Portable 1 View  Result Date: 08/19/2017 CLINICAL DATA:  Recent shoulder surgery. Altered mental status today. EXAM: PORTABLE CHEST 1 VIEW COMPARISON:  06/13/2017. FINDINGS: Poor inspiration. Allowing for that, the heart and mediastinum are normal in the lungs are clear. No collapse or visible infiltrate. Reverse shoulder replacement on the right. IMPRESSION: Poor inspiration.  No active disease suspected. Electronically Signed   By: Nelson Chimes M.D.   On: 08/19/2017 20:35         Scheduled Meds: . aspirin EC  81 mg Oral Daily  . heparin  5,000 Units Subcutaneous Q8H  . insulin aspart  0-5 Units Subcutaneous QHS  . insulin aspart  0-9 Units Subcutaneous TID WC  . levETIRAcetam  500 mg Oral BID  . rosuvastatin  10 mg Oral Daily   Continuous Infusions: . dextrose 5 % and 0.45% NaCl 100 mL/hr at 08/20/17 0228     LOS: 0 days    Time spent: 35 minutes.     Elmarie Shiley, MD [facility] Pager [phone]  If 7PM-7AM, please contact night-coverage [email or web address] Password [facility] 08/20/2017, 3:38 PM

## 2017-08-20 NOTE — Progress Notes (Signed)
Patient ID: Denise Sharp, female   DOB: 13-May-1946, 72 y.o.   MRN: [id number]   LOS: 0 days   Subjective: [name] is recently s/p right reverse shoulder arthroplasty. While at SNF she fell but not on her arm nor did she use it to break her fall. Ever since, however, she's had pain in the right arm. She points to her distal lateral upper arm as the source of the pain. She says the shoulder feels ok. She denies N/T.   Objective: Vital signs in last 24 hours: Temp:  [97.8 F (36.6 C)-98.5 F (36.9 C)] 98.1 F (36.7 C) (02/05 1125) Pulse Rate:  [53-64] 64 (02/05 1125) Resp:  [10-20] 20 (02/05 1125) BP: (90-127)/(53-84) 107/53 (02/05 1125) SpO2:  [96 %-99 %] 99 % (02/05 1125) Weight:  [115 kg (253 lb 8.5 oz)] 115 kg (253 lb 8.5 oz) (02/05 0314)     Laboratory  CBC Recent Labs    08/19/17 1946 08/19/17 2001 08/20/17 0501  WBC 12.4*  --  10.1  HGB 10.6* 10.5* 10.2*  HCT 33.1* 31.0* 32.6*  PLT 317  --  326   BMET Recent Labs    08/19/17 1946 08/19/17 2001 08/20/17 0501  NA 138 137 140  K 4.4 4.3 4.5  CL 103 101 104  CO2 22  --  23  GLUCOSE 184* 176* 129*  BUN 15 15 16   CREATININE 1.36* 1.20* 1.07*  1.08*  CALCIUM 8.5*  --  8.5*     Physical Exam General appearance: alert and no distress  Right shoulder, elbow, wrist, digits- surgical incision clean and dry, mild TTP lateral arm between bicep/tricep about 4cm proximal to elbow, no instability, no blocks to motion  Sens  Ax/R/M/U intact  Mot   Ax/ R/ PIN/ M/ AIN/ U intact  Rad 2+    Assessment/Plan: 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 No specific recommendation aside from keeping arm in sling. [place] for finger/wrist/elbow motion. F/u with Dr. Mardelle Matte as scheduled.    [name], PA-C Orthopedic Surgery [phone] 08/20/2017   Discussed and agree with above. Minimize narcotics given mental status changes.  Ok to anticoagulate if needed.    Johnny Bridge, MD

## 2017-08-20 NOTE — NC FL2 (Signed)
[place] LEVEL OF CARE SCREENING TOOL     IDENTIFICATION  Patient Name: Denise Sharp Birthdate: 15-Nov-1945 Sex: female Admission Date (Current Location): 08/19/2017  [facility] and [place] Number:  Engineering geologist and Address:  [facility] [place] [place] [address] [place], [place] [address]      Provider Number: [id number]  Attending Physician Name and Address:  Elmarie Shiley, MD  Relative Name and Phone Number:       Current Level of Care: Hospital Recommended Level of Care: [place] Prior Approval Number:    Date Approved/Denied:   PASRR Number: [id number]  Discharge Plan: SNF    Current Diagnoses: Patient Active Problem List   Diagnosis Date Noted  . Altered mental status 08/20/2017  . Rotator cuff arthropathy, right 08/13/2017  . Primary localized osteoarthrosis of shoulder 08/13/2017  . Encounter for preoperative examination for general surgical procedure 08/03/2017  . 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 Candidiasis, intertrigo 08/03/2017  . Acute shoulder pain due to trauma, right 03/05/2017  . 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 Atypical chest pain 10/16/2016  . Dizziness 10/16/2016  . Edema 12/13/2015  . Pain and swelling of right lower leg 12/02/2015  . Hypersomnia, persistent 06/18/2015  . Candida rash of groin 10/31/2014  . Mechanical breakdown of implanted electronic neurostimulator of peripheral nerve ([place]) 10/16/2014  . Acute cystitis without hematuria 10/13/2014  . Obesity hypoventilation syndrome ([place]) 08/13/2014  . Frequent falls 06/23/2014  . Chronic cough 05/05/2014  . Adenoma of left adrenal gland 03/24/2014  . Cervicogenic headache 03/23/2014  . Syncope and collapse 03/12/2014  . Vitamin D deficiency 01/06/2014  . Pannus, abdominal 12/23/2013  . Rectal bleeding 12/03/2013  . Weight gain 12/03/2013  . Obesity,  unspecified 08/25/2013  . Fatigue due to depression 08/04/2013  . Diaphragmatic hernia 04/06/2013  . Diabetic polyneuropathy ([place]) 03/25/2013  . Abnormality of gait 03/25/2013  . Multiple pulmonary nodules 03/20/2013  . Shortness of breath 03/20/2013  . Anemia 08/06/2012  . Iron deficiency anemia 08/06/2012  . Functional disorder of bladder 07/15/2012  . Incomplete bladder emptying 07/15/2012  . 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 Urge incontinence 07/15/2012  . Increased frequency of urination 07/15/2012  . OSA on CPAP 03/02/2012  . Insomnia secondary to anxiety 02/29/2012  . Sciatica 09/05/2011  . Cervical stenosis of spinal canal 06/14/2011  . Restless legs syndrome 03/13/2011  . Degenerative disk disease 03/13/2011  . Hypertension 03/12/2011  . DM type 2 with diabetic peripheral neuropathy ([place]) 03/12/2011  . Hearing loss, right 03/12/2011    Orientation RESPIRATION BLADDER Height & Weight     Self, Situation, Place  Normal Incontinent Weight: 253 lb 8.5 oz (115 kg) Height:  5\' 4"  (162.6 cm)  BEHAVIORAL SYMPTOMS/MOOD NEUROLOGICAL BOWEL NUTRITION STATUS    Convulsions/Seizures Continent Diet(carb modified)  AMBULATORY STATUS COMMUNICATION OF NEEDS Skin   Limited Assist Verbally Surgical wounds                       Personal Care Assistance Level of Assistance  Bathing, Feeding, Dressing Bathing Assistance: Maximum assistance Feeding assistance: Limited assistance Dressing Assistance: Maximum assistance     Functional Limitations Info  Sight, Hearing, Speech Sight Info: Adequate Hearing Info: Adequate Speech Info: Adequate    SPECIAL CARE FACTORS FREQUENCY  PT (By licensed PT), OT (By licensed OT)     PT Frequency: 5x/wk OT  Frequency: 5x/wk            Contractures Contractures Info: Not present    Additional Factors Info  Code Status, Allergies, Insulin Sliding Scale Code Status Info: Full Allergies Info: Amoxicillin,  Demeclocycline, Erythromycin, Flagyl Metronidazole, Glucophage Metformin Hcl, Tetracyclines & Related, Biaxin Clarithromycin, Diovan Valsartan, Xanax Alprazolam, Sulfa Antibiotics   Insulin Sliding Scale Info: 0-9 units 3x/day; 0-5 units daily at bedtime       Current Medications (08/20/2017):  This is the current hospital active medication list Current Facility-Administered Medications  Medication Dose Route Frequency Provider Last Rate Last Dose  . acetaminophen (TYLENOL) tablet 650 mg  650 mg Oral Q6H PRN Amin, Ankit Chirag, MD       Or  . acetaminophen (TYLENOL) suppository 650 mg  650 mg Rectal Q6H PRN Amin, Ankit Chirag, MD      . albuterol (PROVENTIL) (2.5 MG/3ML) 0.083% nebulizer solution 2.5 mg  2.5 mg Inhalation Q8H PRN Amin, Ankit Chirag, MD      . aspirin EC tablet 81 mg  81 mg Oral Daily Amin, Ankit Chirag, MD   81 mg at 08/20/17 0908  . dextrose 5 %-0.45 % sodium chloride infusion   Intravenous Continuous Amin, Ankit Chirag, MD 100 mL/hr at 08/20/17 0228    . heparin injection 5,000 Units  5,000 Units Subcutaneous Q8H Amin, Ankit Chirag, MD      . insulin aspart (novoLOG) injection 0-5 Units  0-5 Units Subcutaneous QHS Amin, Ankit Chirag, MD      . insulin aspart (novoLOG) injection 0-9 Units  0-9 Units Subcutaneous TID WC Amin, Jeanella Flattery, MD   1 Units at 08/20/17 0735  . levETIRAcetam (KEPPRA) tablet 500 mg  500 mg Oral BID Amin, Ankit Chirag, MD   500 mg at 08/20/17 0908  . ondansetron (ZOFRAN) tablet 4 mg  4 mg Oral Q6H PRN Amin, Ankit Chirag, MD       Or  . ondansetron (ZOFRAN) injection 4 mg  4 mg Intravenous Q6H PRN Amin, Ankit Chirag, MD      . rosuvastatin (CRESTOR) tablet 10 mg  10 mg Oral Daily Amin, Ankit Chirag, MD   10 mg at 08/20/17 1129  . traMADol (ULTRAM) tablet 25 mg  25 mg Oral Q12H PRN Regalado, Belkys A, MD         Discharge Medications: Please see discharge summary for a list of discharge medications.  Relevant Imaging Results:  Relevant Lab  Results:   Additional Information SS#: [id number]  Geralynn Ochs, LCSW

## 2017-08-20 NOTE — Progress Notes (Signed)
TELE applied and confirmed   Alexiah Koroma, RN

## 2017-08-20 NOTE — Progress Notes (Signed)
CSW met with patient and patient's spouse at bedside to discuss discharge planning. Patient appeared confused and discussed a lot of recent history, some of it which made sense and some of it that didn't. Patient's husband was able to confirm that patient had been discharged to [facility] for rehab, but with the concerns about nursing issues that the patient experienced that led to hospitalization, the patient's family would not like her to return there. Per patient and patient's husband, the patient's daughter has been calling [facility] and [facility] and attempting to figure out how to get the patient placed at either facility; patient's husband requested that CSW contact patient's daughter.  CSW called patient's daughter and left voicemail. CSW completed referral and faxed out. CSW will continue to follow.  Laveda Abbe,  Clinical Social Worker [phone]

## 2017-08-21 ENCOUNTER — Inpatient Hospital Stay (HOSPITAL_COMMUNITY): Payer: Medicare Other

## 2017-08-21 DIAGNOSIS — R55 Syncope and collapse: Secondary | ICD-10-CM | POA: Diagnosis not present

## 2017-08-21 DIAGNOSIS — I10 Essential (primary) hypertension: Secondary | ICD-10-CM | POA: Diagnosis not present

## 2017-08-21 DIAGNOSIS — Z7982 Long term (current) use of aspirin: Secondary | ICD-10-CM | POA: Diagnosis not present

## 2017-08-21 DIAGNOSIS — R4020 Unspecified coma: Secondary | ICD-10-CM | POA: Diagnosis not present

## 2017-08-21 DIAGNOSIS — Z96611 Presence of right artificial shoulder joint: Secondary | ICD-10-CM | POA: Diagnosis not present

## 2017-08-21 DIAGNOSIS — M19011 Primary osteoarthritis, right shoulder: Secondary | ICD-10-CM | POA: Diagnosis not present

## 2017-08-21 DIAGNOSIS — E785 Hyperlipidemia, unspecified: Secondary | ICD-10-CM | POA: Diagnosis not present

## 2017-08-21 DIAGNOSIS — Z79899 Other long term (current) drug therapy: Secondary | ICD-10-CM | POA: Diagnosis not present

## 2017-08-21 DIAGNOSIS — I959 Hypotension, unspecified: Secondary | ICD-10-CM | POA: Diagnosis not present

## 2017-08-21 DIAGNOSIS — R400 Somnolence: Secondary | ICD-10-CM | POA: Diagnosis not present

## 2017-08-21 DIAGNOSIS — Z5189 Encounter for other specified aftercare: Secondary | ICD-10-CM | POA: Diagnosis not present

## 2017-08-21 DIAGNOSIS — M6281 Muscle weakness (generalized): Secondary | ICD-10-CM | POA: Diagnosis not present

## 2017-08-21 DIAGNOSIS — I2510 Atherosclerotic heart disease of native coronary artery without angina pectoris: Secondary | ICD-10-CM | POA: Diagnosis not present

## 2017-08-21 DIAGNOSIS — M75101 Unspecified rotator cuff tear or rupture of right shoulder, not specified as traumatic: Secondary | ICD-10-CM | POA: Diagnosis not present

## 2017-08-21 DIAGNOSIS — R2681 Unsteadiness on feet: Secondary | ICD-10-CM | POA: Diagnosis not present

## 2017-08-21 DIAGNOSIS — R609 Edema, unspecified: Secondary | ICD-10-CM

## 2017-08-21 DIAGNOSIS — E119 Type 2 diabetes mellitus without complications: Secondary | ICD-10-CM | POA: Diagnosis not present

## 2017-08-21 DIAGNOSIS — R569 Unspecified convulsions: Secondary | ICD-10-CM | POA: Diagnosis not present

## 2017-08-21 DIAGNOSIS — N3281 Overactive bladder: Secondary | ICD-10-CM | POA: Diagnosis not present

## 2017-08-21 LAB — ECHOCARDIOGRAM COMPLETE
E decel time: 299 msec
E/e' ratio: 15.41
FS: 34 % (ref 28–44)
Height: 64 in
IVS/LV PW RATIO, ED: 0.88
LA ID, A-P, ES: 37 mm
LA diam end sys: 37 mm
LA diam index: 1.58 cm/m2
LA vol A4C: 67.4 ml
LA vol index: 31.6 mL/m2
LA vol: 74 mL
LV E/e' medial: 15.41
LV E/e'average: 15.41
LV PW d: 9.69 mm — AB (ref 0.6–1.1)
LV e' LATERAL: 7.72 cm/s
LVOT SV: 57 mL
LVOT area: 2.01 cm2
LVOT diameter: 16 mm
Lateral S' vel: 16.9 cm/s
MV Dec: 299
MV Peak grad: 6 mmHg
MV pk A vel: 114 m/s
MV pk E vel: 119 m/s
RV sys press: 33 mmHg
Reg peak vel: 274 cm/s
TDI e' lateral: 7.72
TDI e' medial: 7.94
TR max vel: 274 cm/s
Weight: 4056.464 oz

## 2017-08-21 LAB — GLUCOSE, CAPILLARY
Glucose-Capillary: 92 mg/dL (ref 65–99)
Glucose-Capillary: 131 mg/dL — ABNORMAL HIGH (ref 65–99)
Glucose-Capillary: 94 mg/dL (ref 65–99)

## 2017-08-21 LAB — COMPREHENSIVE METABOLIC PANEL
ALT: 12 U/L — ABNORMAL LOW (ref 14–54)
AST: 18 U/L (ref 15–41)
Albumin: 2.7 g/dL — ABNORMAL LOW (ref 3.5–5.0)
Alkaline Phosphatase: 82 U/L (ref 38–126)
Anion gap: 12 (ref 5–15)
BUN: 13 mg/dL (ref 6–20)
CO2: 24 mmol/L (ref 22–32)
Calcium: 8.5 mg/dL — ABNORMAL LOW (ref 8.9–10.3)
Chloride: 105 mmol/L (ref 101–111)
Creatinine, Ser: 0.98 mg/dL (ref 0.44–1.00)
GFR calc Af Amer: >60 mL/min (ref >60)
GFR calc non Af Amer: 57 mL/min — ABNORMAL LOW (ref >60)
Glucose, Bld: 105 mg/dL — ABNORMAL HIGH (ref 65–99)
Potassium: 4.2 mmol/L (ref 3.5–5.1)
Sodium: 141 mmol/L (ref 135–145)
Total Bilirubin: 0.5 mg/dL (ref 0.3–1.2)
Total Protein: 6.4 g/dL — ABNORMAL LOW (ref 6.5–8.1)

## 2017-08-21 MED ORDER — DIAZEPAM 5 MG PO TABS: 5.0000 mg | ORAL_TABLET | Freq: Every evening | ORAL | 0 refills | Status: DC | PRN

## 2017-08-21 MED ORDER — DIAZEPAM 2 MG PO TABS: 2.0000 mg | ORAL_TABLET | Freq: Every evening | ORAL | Status: DC | PRN

## 2017-08-21 MED ORDER — DULOXETINE HCL 30 MG PO CPEP: 30.0000 mg | ORAL_CAPSULE | Freq: Every day | ORAL | 1 refills | Status: DC

## 2017-08-21 MED ORDER — PREGABALIN 100 MG PO CAPS: 100.0000 mg | ORAL_CAPSULE | Freq: Every day | ORAL | 0 refills | Status: DC | PRN

## 2017-08-21 MED ORDER — DIAZEPAM 5 MG PO TABS: 5.0000 mg | ORAL_TABLET | Freq: Two times a day (BID) | ORAL | Status: DC | PRN

## 2017-08-21 MED ORDER — TRAMADOL HCL 50 MG PO TABS: 25.0000 mg | ORAL_TABLET | Freq: Two times a day (BID) | ORAL | 0 refills | Status: DC | PRN

## 2017-08-21 MED ORDER — DIAZEPAM 2 MG PO TABS: 2.0000 mg | ORAL_TABLET | Freq: Every evening | ORAL | 0 refills | Status: DC | PRN

## 2017-08-21 NOTE — Discharge Summary (Addendum)
Physician Discharge Summary  Denise Sharp WLN:[id number] DOB: 04-02-1946 DOA: 08/19/2017  PCP: [name], MD  Admit date: 08/19/2017 Discharge date: 08/21/2017  Admitted From: SNF Disposition:  SNF  Recommendations for Outpatient Follow-up:  1. Please check renal function.  2. Evaluate patient CIPAP .  3. Avoid narcotics.  4. Notice Valium and lunesta resume PRN for insomnia.     Discharge Condition: Stable.  CODE STATUS: full code.  Diet recommendation: Heart Healthy  Brief/Interim Summary: Denise Shannon Pattersonis a 72 y.o.femalewith medical history significant ofdiabetes mellitus type 2, iron deficiency anemia, hypertension, hyperlipidemia, obstructive sleep apnea, coronary artery disease was sent to the hospital from [facility] for unresponsiveness and low blood pressure. History is somewhat unclear as patient does not know much but she is able to tell me that earlier in the evening she was complaining of some right shoulder pain therefore received some pain medication followed by her routine home medications. Soon after she became drowsy. According to the report she became unresponsive and her systolic blood pressure was in 50s therefore brought to the ER for further evaluation. Patient recently had reversal of the right shoulder arthroplasty about 4 5 days ago and has been at the rehab since then.  In the ER patient's blood pressure improved with IV fluid along with her mentation. She was somnolent at first but was responsive to most of the questions. CT of the head is negative. No obvious source of infection. According to the husband who is at bedside her mentation has improved but not yet back at baseline therefore will admit the patient for observation.  Patient thinks certain medications were increased recently but unable to tell me which ones     Assessment & Plan:   Active Problems:   Altered mental status  1-Acute encephalopathy;   Could be related to multiples medications, (baclofen, valium, benadryl, lunesta, lyrica, requip). Also hypotension or hypoglycemia. Work up for infection negative.  Improving. Suspect will improved over time.  CT head negative.  Will resume low dose valium PRN and lunesta to help her sleep at night.   2-Hypotension. ? Syncope;  D dimer elevated . CT angio negative for PE, Doppler negative for DVT.  Hold BP medication.  Hypotension resolved with IV fluids.  ECHO; normal EF, Diastolic dysfunction. Follow up with PCP.   AKI; mild. Improved with IV fluids.   DM; SSI.  She has not required insulin during this admission.  I will hold glipizide and januvia to avoid hypoglycemia. Her CBG has been in the 100 ranges.   Hyperlipidemia -Continue statin  Polyneuropathy -will resume lyrica 100 mg daily PRN. Patient report taking this medication only PRN  Obstructive sleep apnea - CPAP  Status post recent right rotator cuff tear repair -Pain control, avoid narcotics or any sedating medication.  Low dose tramadol. Observed.  X ray. Negative for fracture.  Ask ortho to evaluate, per patient request   CAD -Stable. Continue aspirin and statin.  Holding off on anti-hypertensives SBP has been 110--130 range.      Discharge Diagnoses:  Active Problems:   Hypertension   DM type 2 with diabetic peripheral neuropathy (HCC)   Insomnia secondary to anxiety   OSA on CPAP   Syncope and collapse   Altered mental status    Discharge Instructions  Discharge Instructions    Diet - low sodium heart healthy   Complete by:  As directed    Increase activity slowly   Complete by:  As  directed      Allergies as of 08/21/2017      Reactions   Amoxicillin Diarrhea, Other (See Comments)   .PATIENT HAS HAD A PCN REACTION WITH IMMEDIATE RASH, FACIAL/TONGUE/THROAT SWELLING, SOB, OR LIGHTHEADEDNESS WITH HYPOTENSION:  #  #  #  YES  #  #  #   Has patient had a PCN reaction causing severe  rash involving mucus membranes or skin necrosis: No Has patient had a PCN reaction that required hospitalization: No Has patient had a PCN reaction occurring within the last 10 years: #  #  #  YES  #  #  #  TOLERATED cefazolin pre-op 08/13/17   Demeclocycline Hives   Erythromycin Nausea And Vomiting, Other (See Comments)   Severe irritable bowel   Flagyl [metronidazole] Nausea And Vomiting, Other (See Comments)   Severe irritable bowel   Glucophage [metformin Hcl] Nausea And Vomiting, Other (See Comments)   "Sick"   Tetracyclines & Related Hives, Rash   Biaxin [clarithromycin] Nausea And Vomiting, Other (See Comments)   Severe irritable bowel   Diovan [valsartan]    UNSPECIFIED REACTION    Xanax [alprazolam]    UNSPECIFIED REACTION    Sulfa Antibiotics Rash, Other (See Comments)   As child      Medication List    STOP taking these medications   amLODipine 5 MG tablet Commonly known as:  NORVASC   baclofen 10 MG tablet Commonly known as:  LIORESAL   benzonatate 200 MG capsule Commonly known as:  TESSALON   carvedilol 3.125 MG tablet Commonly known as:  COREG   diphenhydrAMINE 25 MG tablet Commonly known as:  BENADRYL   fluconazole 150 MG tablet Commonly known as:  DIFLUCAN   furosemide 20 MG tablet Commonly known as:  LASIX   glipiZIDE 5 MG tablet Commonly known as:  GLUCOTROL   HYDROcodone-acetaminophen 10-325 MG tablet Commonly known as:  NORCO   rOPINIRole 1 MG tablet Commonly known as:  REQUIP   sitaGLIPtin 100 MG tablet Commonly known as:  JANUVIA   tizanidine 2 MG capsule Commonly known as:  ZANAFLEX     TAKE these medications   albuterol 108 (90 Base) MCG/ACT inhaler Commonly known as:  PROVENTIL HFA;VENTOLIN HFA Inhale 2 puffs into the lungs every 8 (eight) hours as needed for wheezing or shortness of breath.   aspirin EC 81 MG tablet Take 1 tablet (81 mg total) by mouth daily.   BOUDREAUXS BUTT PASTE EX Apply 1 application topically at  bedtime.   butalbital-acetaminophen-caffeine 50-325-40 MG tablet Commonly known as:  FIORICET, ESGIC Take 1 tablet by mouth every 6 (six) hours as needed for headache.   diazepam 5 MG tablet Commonly known as:  VALIUM Take 1 tablet (5 mg total) by mouth at bedtime as needed for anxiety. What changed:    medication strength  how much to take  when to take this  reasons to take this  additional instructions   DULoxetine 30 MG capsule Commonly known as:  CYMBALTA Take 1 capsule (30 mg total) by mouth daily. What changed:  when to take this   levETIRAcetam 500 MG tablet Commonly known as:  KEPPRA Take 1 tablet (500 mg total) by mouth 2 (two) times daily.   LUNESTA 2 MG Tabs tablet Generic drug:  eszopiclone Take 2 mg immediately before bedtime   ondansetron 4 MG tablet Commonly known as:  ZOFRAN Take 1 tablet (4 mg total) by mouth every 8 (eight) hours as needed for nausea or  vomiting.   pregabalin 100 MG capsule Commonly known as:  LYRICA Take 1 capsule (100 mg total) by mouth daily as needed (for neuropatic pain). What changed:    when to take this  reasons to take this   rosuvastatin 10 MG tablet Commonly known as:  CRESTOR Take 1 tablet (10 mg total) by mouth daily.   sennosides-docusate sodium 8.6-50 MG tablet Commonly known as:  SENOKOT-S Take 2 tablets by mouth daily.   traMADol 50 MG tablet Commonly known as:  ULTRAM Take 0.5 tablets (25 mg total) by mouth every 12 (twelve) hours as needed for severe pain. What changed:    how much to take  when to take this  reasons to take this   VITAMIN D PO Take 5,000 Units by mouth every other day.       Allergies  Allergen Reactions  . Amoxicillin Diarrhea and Other (See Comments)    .PATIENT HAS HAD A PCN REACTION WITH IMMEDIATE RASH, FACIAL/TONGUE/THROAT SWELLING, SOB, OR LIGHTHEADEDNESS WITH HYPOTENSION:  #  #  #  YES  #  #  #   Has patient had a PCN reaction causing severe rash involving mucus  membranes or skin necrosis: No Has patient had a PCN reaction that required hospitalization: No Has patient had a PCN reaction occurring within the last 10 years: #  #  #  YES  #  #  #  TOLERATED cefazolin pre-op 08/13/17  . Demeclocycline Hives  . Erythromycin Nausea And Vomiting and Other (See Comments)    Severe irritable bowel  . Flagyl [Metronidazole] Nausea And Vomiting and Other (See Comments)    Severe irritable bowel  . Glucophage [Metformin Hcl] Nausea And Vomiting and Other (See Comments)    "Sick"  . Tetracyclines & Related Hives and Rash  . Biaxin [Clarithromycin] Nausea And Vomiting and Other (See Comments)    Severe irritable bowel  . Diovan [Valsartan]     UNSPECIFIED REACTION   . Xanax [Alprazolam]     UNSPECIFIED REACTION   . Sulfa Antibiotics Rash and Other (See Comments)    As child    Consultations:  Ortho    Procedures/Studies: Dg Shoulder Right  Result Date: 08/20/2017 CLINICAL DATA:  Right shoulder pain.  Fall EXAM: RIGHT SHOULDER - 2+ VIEW COMPARISON:  08/13/2017 FINDINGS: Prior right shoulder replacement. No hardware complicating feature or acute bony abnormality. No fracture, subluxation or dislocation. IMPRESSION: Prior right shoulder replacement.  No acute findings. Electronically Signed   By: Rolm Baptise M.D.   On: 08/20/2017 11:13   Ct Head Wo Contrast  Result Date: 08/19/2017 CLINICAL DATA:  Sudden mental status changes, currently unresponsive. EXAM: CT HEAD WITHOUT CONTRAST TECHNIQUE: Contiguous axial images were obtained from the base of the skull through the vertex without intravenous contrast. COMPARISON:  02/17/2017 FINDINGS: Brain: Age related atrophy. Chronic appearing small vessel ischemic changes the hemispheric white matter. No sign of acute infarction, mass lesion, hemorrhage, hydrocephalus or extra-axial collection. Vascular: There is atherosclerotic calcification of the major vessels at the base of the brain. Skull: Normal Sinuses/Orbits:  Clear/normal Other: None IMPRESSION: No acute finding by CT. Age related atrophy and mild chronic small-vessel ischemic change of the white matter. Electronically Signed   By: Nelson Chimes M.D.   On: 08/19/2017 20:41   Ct Angio Chest Pe W Or Wo Contrast  Result Date: 08/20/2017 CLINICAL DATA:  Elevated D-dimer.  Recent shoulder surgery. EXAM: CT ANGIOGRAPHY CHEST WITH CONTRAST TECHNIQUE: Multidetector CT imaging of the chest  was performed using the standard protocol during bolus administration of intravenous contrast. Multiplanar CT image reconstructions and MIPs were obtained to evaluate the vascular anatomy. CONTRAST:  14mL ISOVUE-370 IOPAMIDOL (ISOVUE-370) INJECTION 76% COMPARISON:  Chest radiograph, 08/19/2017.  Chest CTA, 12/07/2015. FINDINGS: Cardiovascular: Satisfactory opacification of the pulmonary arteries to the segmental level. No evidence of pulmonary embolism. Normal heart size. No pericardial effusion. Mild coronary artery calcifications. Great vessels normal in caliber. Aorta is not opacified. No aortic atherosclerotic calcifications. Mediastinum/Nodes: Visualized thyroid is unremarkable. No mediastinal or hilar masses. No pathologically enlarged lymph nodes. Trachea is widely patent. Esophagus is unremarkable. Lungs/Pleura: There is right lower lobe opacity most consistent with atelectasis. Minimal subsegmental atelectasis noted in the dependent left lower lobe. Lungs otherwise clear. Trace right pleural effusion. No left pleural effusion. No pneumothorax. Upper Abdomen: Changes from prior gastric surgery stable from the prior CT. 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 No acute findings in the visible upper abdomen. Musculoskeletal: There is edema surrounding the new right shoulder prosthesis. No acute fracture. No osteoblastic or osteolytic lesions. Review of the MIP images confirms the above findings. IMPRESSION: 1. No evidence of a pulmonary embolism. 2. Right  lower lobe atelectasis and minimal subsegmental left lower lobe atelectasis. No convincing pneumonia. No pulmonary edema. Trace right pleural effusion. Electronically Signed   By: Lajean Manes M.D.   On: 08/20/2017 17:19   Dg Esophagus  Result Date: 08/16/2017 CLINICAL DATA:  72 year old female recently status post shoulder surgery with dysphagia as an inpatient. Feeling that solids and medication tablets are sticking. EXAM: ESOPHOGRAM/BARIUM SWALLOW TECHNIQUE: Single contrast examination was performed using thin barium, and barium tablet. FLUOROSCOPY TIME:  Fluoroscopy Time:  1 min 54 sec Radiation Exposure Index (if provided by the fluoroscopic device): Number of Acquired Spot Images: 0 COMPARISON:  Report of barium swallow and upper GI series 10/11/2003 (no images available). CT Abdomen and Pelvis 04/03/2016 FINDINGS: A single contrast study was undertaken and the patient tolerated this well and without difficulty. No obstruction to the forward flow of contrast throughout the esophagus and into the stomach, but frequent esophageal tertiary contractions (series 1, image 58). The distal esophagus demonstrates a short segment fairly smooth area of circumferential tapering (series 6, image 1 and series 14, image 21) which did not permit passage of a 06.2 millimeter barium tablet despite multiple additional swallows of water and thin barium estimated lumen diameter of the narrow it segment is 6-7 mm. Postoperative changes to the esophageal hiatus and stomach are demonstrated on the 2017 CT, which appear concordant with the visible proximal stomach configuration. IMPRESSION: 1. 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 2. Superimposed resp esophagus with frequent tertiary esophageal contractions. Electronically Signed   By: Genevie Ann M.D.   On: 08/16/2017  09:55   Dg Chest Portable 1 View  Result Date: 08/19/2017 CLINICAL DATA:  Recent shoulder surgery. Altered mental status today. EXAM: PORTABLE CHEST 1 VIEW COMPARISON:  06/13/2017. FINDINGS: Poor inspiration. Allowing for that, the heart and mediastinum are normal in the lungs are clear. No collapse or visible infiltrate. Reverse shoulder replacement on the right. IMPRESSION: Poor inspiration.  No active disease suspected. Electronically Signed   By: Nelson Chimes M.D.   On: 08/19/2017 20:35   Dg Shoulder Right Port  Result Date: 08/13/2017 CLINICAL DATA:  Status post right shoulder placement  EXAM: PORTABLE RIGHT SHOULDER COMPARISON:  03/04/2017 FINDINGS: Changes of right shoulder replacement. Normal alignment. No hardware complicating feature. Degenerative changes in the right AC joint. IMPRESSION: Right shoulder replacement. No visible hardware or bony complicating feature. Electronically Signed   By: Rolm Baptise M.D.   On: 08/13/2017 19:21     Subjective: She is alert,,she is aware she is in the hospital. Denies chest pain. She is asking for ortho to fix her sling.  She is asking if her CPAP can be reviewed.   Discharge Exam: Vitals:   08/21/17 0529 08/21/17 1032  BP: (!) 154/58 131/64  Pulse: 65 68  Resp: 18 18  Temp: 98 F (36.7 C) 98.7 F (37.1 C)  SpO2: 99% 99%   Vitals:   08/20/17 2211 08/21/17 0115 08/21/17 0529 08/21/17 1032  BP: (!) 147/71 (!) 146/64 (!) 154/58 131/64  Pulse: 63 66 65 68  Resp: 18 18 18 18   Temp: 98 F (36.7 C) 98.1 F (36.7 C) 98 F (36.7 C) 98.7 F (37.1 C)  TempSrc: Oral Oral Oral Oral  SpO2: 100% 98% 99% 99%  Weight:      Height:        General: Pt is alert, awake, not in acute distress Cardiovascular: RRR, S1/S2 +, no rubs, no gallops Respiratory: CTA bilaterally, no wheezing, no rhonchi Abdominal: Soft, NT, ND, bowel sounds + Extremities: no edema, no cyanosis    The results of significant diagnostics from this hospitalization  (including imaging, microbiology, ancillary and laboratory) are listed below for reference.     Microbiology: No results found for this or any previous visit (from the past 240 hour(s)).   Labs: BNP (last 3 results) No results for input(s): BNP in the last 8760 hours. Basic Metabolic Panel: Recent Labs  Lab 08/19/17 1946 08/19/17 2001 08/20/17 0501 08/21/17 0311  NA 138 137 140 141  K 4.4 4.3 4.5 4.2  CL 103 101 104 105  CO2 22  --  23 24  GLUCOSE 184* 176* 129* 105*  BUN 15 15 16 13   CREATININE 1.36* 1.20* 1.07*  1.08* 0.98  CALCIUM 8.5*  --  8.5* 8.5*   Liver Function Tests: Recent Labs  Lab 08/19/17 1946 08/20/17 0501 08/21/17 0311  AST 19 17 18   ALT 12* 11* 12*  ALKPHOS 81 79 82  BILITOT 0.6 0.7 0.5  PROT 6.2* 6.2* 6.4*  ALBUMIN 2.8* 2.7* 2.7*   No results for input(s): LIPASE, AMYLASE in the last 168 hours. Recent Labs  Lab 08/19/17 2013  AMMONIA <9*   CBC: Recent Labs  Lab 08/19/17 1946 08/19/17 2001 08/20/17 0501  WBC 12.4*  --  10.1  HGB 10.6* 10.5* 10.2*  HCT 33.1* 31.0* 32.6*  MCV 90.9  --  92.6  PLT 317  --  326   Cardiac Enzymes: Recent Labs  Lab 08/20/17 0951 08/20/17 1436 08/20/17 2053  TROPONINI <0.03 <0.03 <0.03   BNP: Invalid input(s): POCBNP CBG: Recent Labs  Lab 08/20/17 1734 08/20/17 1756 08/20/17 2210 08/21/17 0627 08/21/17 1140  GLUCAP 65 93 79 92 131*   D-Dimer Recent Labs    08/20/17 0951  DDIMER 3.88*   Hgb A1c No results for input(s): HGBA1C in the last 72 hours. Lipid Profile No results for input(s): CHOL, HDL, LDLCALC, TRIG, CHOLHDL, LDLDIRECT in the last 72 hours. Thyroid function studies Recent Labs    08/20/17 0501  TSH 2.279   Anemia work up No results for input(s): VITAMINB12, FOLATE, FERRITIN, TIBC, IRON, RETICCTPCT in  the last 72 hours. Urinalysis    Component Value Date/Time   COLORURINE YELLOW 08/19/2017 2228   APPEARANCEUR HAZY (A) 08/19/2017 2228   APPEARANCEUR Clear 11/11/2014  1602   LABSPEC 1.015 08/19/2017 2228   LABSPEC 1.006 06/21/2014 1623   PHURINE 5.0 08/19/2017 2228   GLUCOSEU NEGATIVE 08/19/2017 2228   GLUCOSEU Negative 06/21/2014 1623   HGBUR NEGATIVE 08/19/2017 2228   BILIRUBINUR NEGATIVE 08/19/2017 2228   BILIRUBINUR Negative 02/01/2016 1507   BILIRUBINUR Negative 11/11/2014 1602   BILIRUBINUR Negative 06/21/2014 1623   KETONESUR NEGATIVE 08/19/2017 2228   PROTEINUR NEGATIVE 08/19/2017 2228   UROBILINOGEN 0.2 02/01/2016 1507   NITRITE NEGATIVE 08/19/2017 2228   LEUKOCYTESUR NEGATIVE 08/19/2017 2228   LEUKOCYTESUR Negative 11/11/2014 1602   LEUKOCYTESUR Negative 06/21/2014 1623   Sepsis Labs Invalid input(s): PROCALCITONIN,  WBC,  LACTICIDVEN Microbiology No results found for this or any previous visit (from the past 240 hour(s)).   Time coordinating discharge: Over 30 minutes  SIGNED:   Elmarie Shiley, MD  [facility] 08/21/2017, 1:11 PM Pager [phone]  If 7PM-7AM, please contact night-coverage [email or web address] Password [email or web address]

## 2017-08-21 NOTE — Progress Notes (Signed)
Ortho came by to see the patient but she is off the floor for studies and will attempt to later today or tomorrow

## 2017-08-21 NOTE — Progress Notes (Signed)
Pt wears home CPAP.

## 2017-08-21 NOTE — Progress Notes (Signed)
Bilateral lower extremity venous duplex has been completed. Negative for DVT.  08/21/17 9:32 AM Denise Sharp RVT

## 2017-08-21 NOTE — Clinical Social Work Placement (Signed)
Nurse to call report to [phone], Room [phone]; Ask for [place] Nurse    CLINICAL SOCIAL WORK PLACEMENT  NOTE  Date:  08/21/2017  Patient Details  Name: Denise Sharp MRN: [id number] Date of Birth: [birth year]  Clinical Social Work is seeking post-discharge placement for this patient at the [place] level of care (*CSW will initial, date and re-position this form in  chart as items are completed):  Yes   Patient/family provided with [facility] Work Department's list of facilities offering this level of care within the geographic area requested by the patient (or if unable, by the patient's family).  Yes   Patient/family informed of their freedom to choose among providers that offer the needed level of care, that participate in Medicare, Medicaid or managed care program needed by the patient, have an available bed and are willing to accept the patient.  Yes   Patient/family informed of [facility]'s ownership interest in [facility] and [facility], as well as of the fact that they are under no obligation to receive care at these facilities.  PASRR submitted to EDS on       PASRR number received on 08/20/17     Existing PASRR number confirmed on       FL2 transmitted to all facilities in geographic area requested by pt/family on 08/20/17     FL2 transmitted to all facilities within larger geographic area on       Patient informed that his/her managed care company has contracts with or will negotiate with certain facilities, including the following:        Yes   Patient/family informed of bed offers received.  Patient chooses bed at [facility]     Physician recommends and patient chooses bed at      Patient to be transferred to [facility] on 08/21/17.  Patient to be transferred to facility by [facility]     Patient family notified on 08/21/17 of transfer.  Name of family member notified:  Leanor Rubenstein      PHYSICIAN       Additional Comment:    _______________________________________________ Geralynn Ochs, LCSW 08/21/2017, 2:07 PM

## 2017-08-21 NOTE — Progress Notes (Signed)
Report given to nurse Boykin at [facility]. Family at bedside aware of disposition and aware to take all belongings with him to facility. Awaiting for transport.   Ave Filter, RN

## 2017-08-21 NOTE — Evaluation (Addendum)
Physical Therapy Evaluation Patient Details Name: Denise Sharp MRN: [id number] DOB: July 31, 1945 Today's Date: 08/21/2017   History of Present Illness  Pt is a 72 y/o female admitted from SNF following period of unresponsiveness and hypotension. Pt recently underwent R reverse TSA. CT angio negative for PE, negative for DVT, CT of head negative for acute abnormality. PMH includes HTN, OSA, DM, L adrenal mass, CAD, anemia, and R reverse TSA.   Clinical Impression  Pt admitted secondary to problem above with deficits below. Pt very unsteady during gait and required min A for steadying. Pt also presenting with some cognitive deficits, however, no family present to determine baseline. Educated about shoulder precautions and sling positioning. Would like to go to a different SNF at d/c if possible. Will continue to follow acutely to maximize functional mobility independence and safety.     Follow Up Recommendations SNF;Supervision for mobility/OOB    Equipment Recommendations  None recommended by PT    Recommendations for Other Services       Precautions / Restrictions Precautions Precautions: Fall;Shoulder Type of Shoulder Precautions: no ROM R shoulder Shoulder Interventions: Shoulder sling/immobilizer Precaution Comments: Pt reports fall at SNF after episode of incontinence.  Required Braces or Orthoses: Sling Restrictions Weight Bearing Restrictions: Yes RUE Weight Bearing: Non weight bearing      Mobility  Bed Mobility Overal bed mobility: Needs Assistance Bed Mobility: Supine to Sit     Supine to sit: Min assist     General bed mobility comments: Min A for trunk elevation. Able to maintain precautions. Exited bed on the L to prevent WB on RUE.   Transfers Overall transfer level: Needs assistance Equipment used: 1 person hand held assist Transfers: Sit to/from Stand Sit to Stand: Min assist         General transfer comment: Min A for lift assist and steadying  assist.   Ambulation/Gait Ambulation/Gait assistance: Min assist Ambulation Distance (Feet): 20 Feet Assistive device: 1 person hand held assist Gait Pattern/deviations: Decreased stride length;Step-through pattern;Shuffle;Wide base of support Gait velocity: Decreased  Gait velocity interpretation: Below normal speed for age/gender General Gait Details: Slow, unsteady gait. Pt reporting fatigue which limited distance to within the room. Pt with slight LOB anteriorly requiring min A for regaining balance.   Stairs            Wheelchair Mobility    Modified Rankin (Stroke Patients Only)       Balance Overall balance assessment: Needs assistance Sitting-balance support: Feet supported Sitting balance-Leahy Scale: Fair     Standing balance support: Single extremity supported;During functional activity Standing balance-Leahy Scale: Poor Standing balance comment: Reliant on LUE support.                              Pertinent Vitals/Pain Pain Assessment: Faces Faces Pain Scale: Hurts little more Pain Location: R shoulder Pain Descriptors / Indicators: Operative site guarding;Sore Pain Intervention(s): Limited activity within patient's tolerance;Monitored during session;Repositioned    Home Living Family/patient expects to be discharged to:: [place]: Spouse/significant other               Additional Comments: Reports she does not want to go back to [place].     Prior Function Level of Independence: Needs assistance   Gait / Transfers Assistance Needed: Reports she was walking with quad cane with PT at SNF; otherwise used WC.   ADL's / Homemaking Assistance Needed: urgency for  voiding. Needed assist from staff for ADLs.         Hand Dominance   Dominant Hand: Right    Extremity/Trunk Assessment   Upper Extremity Assessment Upper Extremity Assessment: Defer to OT evaluation(RUE in sling )    Lower  Extremity Assessment Lower Extremity Assessment: Generalized weakness;RLE deficits/detail;LLE deficits/detail RLE Sensation: history of peripheral neuropathy LLE Sensation: history of peripheral neuropathy    Cervical / Trunk Assessment Cervical / Trunk Assessment: Other exceptions Cervical / Trunk Exceptions: hx of back surg 1995 and then a stimulator placement at a later date  by [facility] doctor per patient  Communication   Communication: No difficulties  Cognition Arousal/Alertness: Awake/alert Behavior During Therapy: WFL for tasks assessed/performed Overall Cognitive Status: No family/caregiver present to determine baseline cognitive functioning                                 General Comments: Pt reporting she coded 3 times during admission, however, no documentation to support. Pt also stating she was on bed rest, however, no bed rest orders present. Pt with slowed cognitive processing as well.       General Comments General comments (skin integrity, edema, etc.): Pt educated about appropriate positioning of sling. Pt very concerned about not having purewick. Educated about importance of mobility and getting up with assist to toilet, however, pt still concerned about urinary urgency. Notified NT and NT put diaper under pt in sitting to catch incontinence.     Exercises     Assessment/Plan    PT Assessment Patient needs continued PT services  PT Problem List Decreased balance;Decreased activity tolerance;Decreased mobility;Impaired sensation;Pain;Decreased strength;Decreased cognition;Decreased knowledge of use of DME;Decreased safety awareness       PT Treatment Interventions DME instruction;Gait training;Therapeutic activities;Functional mobility training;Balance training;Therapeutic exercise;Patient/family education;Cognitive remediation    PT Goals (Current goals can be found in the Care Plan section)  Acute Rehab PT Goals Patient Stated Goal: to go to a  different rehab before going home  PT Goal Formulation: With patient Time For Goal Achievement: 09/04/17 Potential to Achieve Goals: Good    Frequency Min 2X/week   Barriers to discharge        Co-evaluation               AM-PAC PT "6 Clicks" Daily Activity  Outcome Measure Difficulty turning over in bed (including adjusting bedclothes, sheets and blankets)?: Unable Difficulty moving from lying on back to sitting on the side of the bed? : Unable Difficulty sitting down on and standing up from a chair with arms (e.g., wheelchair, bedside commode, etc,.)?: Unable Help needed moving to and from a bed to chair (including a wheelchair)?: A Little Help needed walking in hospital room?: A Little Help needed climbing 3-5 steps with a railing? : A Lot 6 Click Score: 11    End of Session Equipment Utilized During Treatment: Gait belt;Other (comment)(sling ) Activity Tolerance: Patient tolerated treatment well Patient left: in chair;with call bell/phone within reach;with chair alarm set Nurse Communication: Mobility status PT Visit Diagnosis: Unsteadiness on feet (R26.81);History of falling (Z91.81);Difficulty in walking, not elsewhere classified (R26.2);Pain Pain - Right/Left: Right Pain - part of body: Shoulder    Time: 5465-6812 PT Time Calculation (min) (ACUTE ONLY): 32 min   Charges:   PT Evaluation $PT Eval Low Complexity: 1 Low PT Treatments $Therapeutic Activity: 8-22 mins   PT G Codes:  Leighton Ruff, PT, DPT  Acute Rehabilitation Services  Pager: [phone]   Rudean Hitt 08/21/2017, 11:43 AM

## 2017-08-21 NOTE — Progress Notes (Signed)
[facility] called to transport patient to [facility]. Patient will be going to room 604.   Ave Filter, RN

## 2017-08-21 NOTE — Progress Notes (Signed)
  Echocardiogram 2D Echocardiogram has been performed.  Denise Sharp 08/21/2017, 4:46 PM

## 2017-08-24 DIAGNOSIS — E785 Hyperlipidemia, unspecified: Secondary | ICD-10-CM | POA: Diagnosis not present

## 2017-08-24 DIAGNOSIS — E119 Type 2 diabetes mellitus without complications: Secondary | ICD-10-CM | POA: Diagnosis not present

## 2017-08-24 DIAGNOSIS — I2510 Atherosclerotic heart disease of native coronary artery without angina pectoris: Secondary | ICD-10-CM | POA: Diagnosis not present

## 2017-08-24 DIAGNOSIS — N3281 Overactive bladder: Secondary | ICD-10-CM | POA: Diagnosis not present

## 2017-08-24 DIAGNOSIS — M19011 Primary osteoarthritis, right shoulder: Secondary | ICD-10-CM | POA: Diagnosis not present

## 2017-08-24 DIAGNOSIS — I10 Essential (primary) hypertension: Secondary | ICD-10-CM | POA: Diagnosis not present

## 2017-08-24 DIAGNOSIS — Z96611 Presence of right artificial shoulder joint: Secondary | ICD-10-CM | POA: Diagnosis not present

## 2017-08-24 DIAGNOSIS — I959 Hypotension, unspecified: Secondary | ICD-10-CM | POA: Diagnosis not present

## 2017-09-01 DIAGNOSIS — I2510 Atherosclerotic heart disease of native coronary artery without angina pectoris: Secondary | ICD-10-CM | POA: Diagnosis not present

## 2017-09-01 DIAGNOSIS — Z96611 Presence of right artificial shoulder joint: Secondary | ICD-10-CM | POA: Diagnosis not present

## 2017-09-01 DIAGNOSIS — M19011 Primary osteoarthritis, right shoulder: Secondary | ICD-10-CM | POA: Diagnosis not present

## 2017-09-01 DIAGNOSIS — Z7982 Long term (current) use of aspirin: Secondary | ICD-10-CM | POA: Diagnosis not present

## 2017-09-01 DIAGNOSIS — N3281 Overactive bladder: Secondary | ICD-10-CM | POA: Diagnosis not present

## 2017-09-01 DIAGNOSIS — I959 Hypotension, unspecified: Secondary | ICD-10-CM | POA: Diagnosis not present

## 2017-09-01 DIAGNOSIS — R569 Unspecified convulsions: Secondary | ICD-10-CM | POA: Diagnosis not present

## 2017-09-01 DIAGNOSIS — E119 Type 2 diabetes mellitus without complications: Secondary | ICD-10-CM | POA: Diagnosis not present

## 2017-09-01 DIAGNOSIS — E785 Hyperlipidemia, unspecified: Secondary | ICD-10-CM | POA: Diagnosis not present

## 2017-09-02 DIAGNOSIS — M19011 Primary osteoarthritis, right shoulder: Secondary | ICD-10-CM | POA: Diagnosis not present

## 2017-09-03 ENCOUNTER — Telehealth: Payer: Self-pay | Admitting: Podiatry

## 2017-09-03 NOTE — Telephone Encounter (Signed)
Pt in nursing facility due to shoulder surgery and they are wanting to do physical therapy but pt is concerned because she has not be released from you due to her broken foot. They are wanting her to be on a elliptical bike and she is afraid she may  her foot is not completely healed.Can some one call her charge nurse at [facility] facility and let them know what she is able to do as far as physical therapy with her foot. The main number to [facility] is [phone].

## 2017-09-03 NOTE — Telephone Encounter (Signed)
Dr. Prudence Davidson states pt may exercise as tolerated on the elliptical bike in a thick, hard bottomed shoe.

## 2017-09-09 DIAGNOSIS — K589 Irritable bowel syndrome without diarrhea: Secondary | ICD-10-CM | POA: Diagnosis not present

## 2017-09-09 DIAGNOSIS — Z96653 Presence of artificial knee joint, bilateral: Secondary | ICD-10-CM | POA: Diagnosis not present

## 2017-09-09 DIAGNOSIS — E1142 Type 2 diabetes mellitus with diabetic polyneuropathy: Secondary | ICD-10-CM | POA: Diagnosis not present

## 2017-09-09 DIAGNOSIS — E559 Vitamin D deficiency, unspecified: Secondary | ICD-10-CM | POA: Diagnosis not present

## 2017-09-09 DIAGNOSIS — M797 Fibromyalgia: Secondary | ICD-10-CM | POA: Diagnosis not present

## 2017-09-09 DIAGNOSIS — I4891 Unspecified atrial fibrillation: Secondary | ICD-10-CM | POA: Diagnosis not present

## 2017-09-09 DIAGNOSIS — M4802 Spinal stenosis, cervical region: Secondary | ICD-10-CM | POA: Diagnosis not present

## 2017-09-09 DIAGNOSIS — I872 Venous insufficiency (chronic) (peripheral): Secondary | ICD-10-CM | POA: Diagnosis not present

## 2017-09-09 DIAGNOSIS — I2510 Atherosclerotic heart disease of native coronary artery without angina pectoris: Secondary | ICD-10-CM | POA: Diagnosis not present

## 2017-09-09 DIAGNOSIS — N3281 Overactive bladder: Secondary | ICD-10-CM | POA: Diagnosis not present

## 2017-09-09 DIAGNOSIS — G4701 Insomnia due to medical condition: Secondary | ICD-10-CM | POA: Diagnosis not present

## 2017-09-09 DIAGNOSIS — Z96611 Presence of right artificial shoulder joint: Secondary | ICD-10-CM | POA: Diagnosis not present

## 2017-09-09 DIAGNOSIS — G4733 Obstructive sleep apnea (adult) (pediatric): Secondary | ICD-10-CM | POA: Diagnosis not present

## 2017-09-09 DIAGNOSIS — G2581 Restless legs syndrome: Secondary | ICD-10-CM | POA: Diagnosis not present

## 2017-09-09 DIAGNOSIS — F419 Anxiety disorder, unspecified: Secondary | ICD-10-CM | POA: Diagnosis not present

## 2017-09-09 DIAGNOSIS — I10 Essential (primary) hypertension: Secondary | ICD-10-CM | POA: Diagnosis not present

## 2017-09-09 DIAGNOSIS — Z471 Aftercare following joint replacement surgery: Secondary | ICD-10-CM | POA: Diagnosis not present

## 2017-09-09 DIAGNOSIS — E662 Morbid (severe) obesity with alveolar hypoventilation: Secondary | ICD-10-CM | POA: Diagnosis not present

## 2017-09-09 DIAGNOSIS — K5900 Constipation, unspecified: Secondary | ICD-10-CM | POA: Diagnosis not present

## 2017-09-09 DIAGNOSIS — D509 Iron deficiency anemia, unspecified: Secondary | ICD-10-CM | POA: Diagnosis not present

## 2017-09-13 DIAGNOSIS — M4802 Spinal stenosis, cervical region: Secondary | ICD-10-CM | POA: Diagnosis not present

## 2017-09-13 DIAGNOSIS — E1142 Type 2 diabetes mellitus with diabetic polyneuropathy: Secondary | ICD-10-CM | POA: Diagnosis not present

## 2017-09-13 DIAGNOSIS — Z471 Aftercare following joint replacement surgery: Secondary | ICD-10-CM | POA: Diagnosis not present

## 2017-09-13 DIAGNOSIS — I4891 Unspecified atrial fibrillation: Secondary | ICD-10-CM | POA: Diagnosis not present

## 2017-09-13 DIAGNOSIS — I10 Essential (primary) hypertension: Secondary | ICD-10-CM | POA: Diagnosis not present

## 2017-09-13 DIAGNOSIS — I2510 Atherosclerotic heart disease of native coronary artery without angina pectoris: Secondary | ICD-10-CM | POA: Diagnosis not present

## 2017-09-17 DIAGNOSIS — I10 Essential (primary) hypertension: Secondary | ICD-10-CM | POA: Diagnosis not present

## 2017-09-17 DIAGNOSIS — I2510 Atherosclerotic heart disease of native coronary artery without angina pectoris: Secondary | ICD-10-CM | POA: Diagnosis not present

## 2017-09-17 DIAGNOSIS — I4891 Unspecified atrial fibrillation: Secondary | ICD-10-CM | POA: Diagnosis not present

## 2017-09-17 DIAGNOSIS — Z471 Aftercare following joint replacement surgery: Secondary | ICD-10-CM | POA: Diagnosis not present

## 2017-09-17 DIAGNOSIS — M4802 Spinal stenosis, cervical region: Secondary | ICD-10-CM | POA: Diagnosis not present

## 2017-09-17 DIAGNOSIS — E1142 Type 2 diabetes mellitus with diabetic polyneuropathy: Secondary | ICD-10-CM | POA: Diagnosis not present

## 2017-09-18 DIAGNOSIS — E1142 Type 2 diabetes mellitus with diabetic polyneuropathy: Secondary | ICD-10-CM | POA: Diagnosis not present

## 2017-09-18 DIAGNOSIS — M4802 Spinal stenosis, cervical region: Secondary | ICD-10-CM | POA: Diagnosis not present

## 2017-09-18 DIAGNOSIS — Z471 Aftercare following joint replacement surgery: Secondary | ICD-10-CM | POA: Diagnosis not present

## 2017-09-18 DIAGNOSIS — I10 Essential (primary) hypertension: Secondary | ICD-10-CM | POA: Diagnosis not present

## 2017-09-18 DIAGNOSIS — I2510 Atherosclerotic heart disease of native coronary artery without angina pectoris: Secondary | ICD-10-CM | POA: Diagnosis not present

## 2017-09-18 DIAGNOSIS — I4891 Unspecified atrial fibrillation: Secondary | ICD-10-CM | POA: Diagnosis not present

## 2017-09-19 ENCOUNTER — Ambulatory Visit (INDEPENDENT_AMBULATORY_CARE_PROVIDER_SITE_OTHER): Payer: Medicare Other | Admitting: Internal Medicine

## 2017-09-19 ENCOUNTER — Encounter: Payer: Self-pay | Admitting: Internal Medicine

## 2017-09-19 VITALS — BP 124/60 | HR 78 | Temp 98.4°F | Resp 15 | Ht 64.0 in | Wt 250.2 lb

## 2017-09-19 DIAGNOSIS — R197 Diarrhea, unspecified: Secondary | ICD-10-CM

## 2017-09-19 DIAGNOSIS — Z09 Encounter for follow-up examination after completed treatment for conditions other than malignant neoplasm: Secondary | ICD-10-CM

## 2017-09-19 DIAGNOSIS — E538 Deficiency of other specified B group vitamins: Secondary | ICD-10-CM | POA: Diagnosis not present

## 2017-09-19 DIAGNOSIS — F419 Anxiety disorder, unspecified: Secondary | ICD-10-CM | POA: Diagnosis not present

## 2017-09-19 DIAGNOSIS — N179 Acute kidney failure, unspecified: Secondary | ICD-10-CM | POA: Diagnosis not present

## 2017-09-19 DIAGNOSIS — R152 Fecal urgency: Secondary | ICD-10-CM

## 2017-09-19 DIAGNOSIS — I9581 Postprocedural hypotension: Secondary | ICD-10-CM | POA: Diagnosis not present

## 2017-09-19 DIAGNOSIS — D649 Anemia, unspecified: Secondary | ICD-10-CM | POA: Diagnosis not present

## 2017-09-19 DIAGNOSIS — E1142 Type 2 diabetes mellitus with diabetic polyneuropathy: Secondary | ICD-10-CM | POA: Diagnosis not present

## 2017-09-19 DIAGNOSIS — R159 Full incontinence of feces: Secondary | ICD-10-CM | POA: Diagnosis not present

## 2017-09-19 DIAGNOSIS — M12811 Other specific arthropathies, not elsewhere classified, right shoulder: Secondary | ICD-10-CM

## 2017-09-19 DIAGNOSIS — I4891 Unspecified atrial fibrillation: Secondary | ICD-10-CM | POA: Diagnosis not present

## 2017-09-19 DIAGNOSIS — I10 Essential (primary) hypertension: Secondary | ICD-10-CM | POA: Diagnosis not present

## 2017-09-19 DIAGNOSIS — Z471 Aftercare following joint replacement surgery: Secondary | ICD-10-CM | POA: Diagnosis not present

## 2017-09-19 DIAGNOSIS — I2510 Atherosclerotic heart disease of native coronary artery without angina pectoris: Secondary | ICD-10-CM | POA: Diagnosis not present

## 2017-09-19 DIAGNOSIS — M4802 Spinal stenosis, cervical region: Secondary | ICD-10-CM | POA: Diagnosis not present

## 2017-09-19 DIAGNOSIS — F5105 Insomnia due to other mental disorder: Secondary | ICD-10-CM

## 2017-09-19 LAB — COMPREHENSIVE METABOLIC PANEL
ALT: 12 U/L (ref 0–35)
AST: 12 U/L (ref 0–37)
Albumin: 3.6 g/dL (ref 3.5–5.2)
Alkaline Phosphatase: 112 U/L (ref 39–117)
BUN: 17 mg/dL (ref 6–23)
CO2: 26 mEq/L (ref 19–32)
Calcium: 9 mg/dL (ref 8.4–10.5)
Chloride: 102 mEq/L (ref 96–112)
Creatinine, Ser: 0.87 mg/dL (ref 0.40–1.20)
GFR: 68.03 mL/min (ref >60.00)
Glucose, Bld: 211 mg/dL — ABNORMAL HIGH (ref 70–99)
Potassium: 3.8 mEq/L (ref 3.5–5.1)
Sodium: 139 mEq/L (ref 135–145)
Total Bilirubin: 0.3 mg/dL (ref 0.2–1.2)
Total Protein: 7 g/dL (ref 6.0–8.3)

## 2017-09-19 LAB — CBC WITH DIFFERENTIAL/PLATELET
Basophils Absolute: 0.1 10*3/uL (ref 0.0–0.1)
Basophils Relative: 0.7 % (ref 0.0–3.0)
Eosinophils Absolute: 0.1 10*3/uL (ref 0.0–0.7)
Eosinophils Relative: 1.5 % (ref 0.0–5.0)
HCT: 36.2 % (ref 36.0–46.0)
Hemoglobin: 11.7 g/dL — ABNORMAL LOW (ref 12.0–15.0)
Lymphocytes Relative: 24.1 % (ref 12.0–46.0)
Lymphs Abs: 2.1 10*3/uL (ref 0.7–4.0)
MCHC: 32.4 g/dL (ref 30.0–36.0)
MCV: 90.6 fl (ref 78.0–100.0)
Monocytes Absolute: 0.5 10*3/uL (ref 0.1–1.0)
Monocytes Relative: 5.3 % (ref 3.0–12.0)
Neutro Abs: 6.1 10*3/uL (ref 1.4–7.7)
Neutrophils Relative %: 68.4 % (ref 43.0–77.0)
Platelets: 239 10*3/uL (ref 150.0–400.0)
RBC: 3.99 Mil/uL (ref 3.87–5.11)
RDW: 15.3 % (ref 11.5–15.5)
WBC: 8.9 10*3/uL (ref 4.0–10.5)

## 2017-09-19 LAB — VITAMIN B12: Vitamin B-12: 204 pg/mL — ABNORMAL LOW (ref 211–911)

## 2017-09-19 LAB — MAGNESIUM: Magnesium: 1.4 mg/dL — ABNORMAL LOW (ref 1.5–2.5)

## 2017-09-19 MED ORDER — ESZOPICLONE 3 MG PO TABS: 3.0000 mg | ORAL_TABLET | Freq: Every day | ORAL | 0 refills | Status: DC

## 2017-09-19 MED ORDER — LEVETIRACETAM 500 MG PO TABS: 500.0000 mg | ORAL_TABLET | Freq: Two times a day (BID) | ORAL | 1 refills | Status: DC

## 2017-09-19 NOTE — Progress Notes (Signed)
Subjective:  Patient ID: Denise Sharp, female    DOB: Jan 23, 1946  Age: 72 y.o. MRN: [id number]  CC: The primary encounter diagnosis was Acute renal failure, unspecified acute renal failure type ([place]). Diagnoses of Hypocalcemia, Diarrhea, unspecified type, Anemia, unspecified type, Hospital discharge follow-up, Insomnia secondary to anxiety, Rotator cuff arthropathy, right, Postprocedural hypotension, Incontinence of feces with fecal urgency, DM type 2 with diabetic peripheral neuropathy ([place]), Hypomagnesemia, and B12 deficiency were also pertinent to this visit.  HPI Denise Sharp presents for hospital follow up. Patient arrived on time but as dealyed by 20 minutes due to  new onset fecal incontinence which occurred in the office prior to being roomed.    She states that she had a Soft stool  Today (for  the first time since her surgery ).  Underwent  Reverse right shoulder arthroplasty by Floyde Parkins on Jan 29 at [facility] for rotator cuff arthropathy.  She received perioperative Cefazolin  And early ambulation for DVT prophylaxis. She was  discharged to SNF on Feb 1 .  Readmitted on Feb 4 with metabolic encephalopathy.  She became unresponsive  and hypotensive unresponsive after receiving a dose of pain medication at [facility] and rehab.  She received IV fluids in the ER and one dose of narcan.  Her medications were reviewed with concern for  polypharmacy and OSA as contributing factors to the epispde   (patient's chronic  active medication list included baclofen, valium, benadryl, lunesta, lyrica, and requip) .   Lyrica dose was resumed at 100 mg daily as a prn dose . Narcotics were dc'd in favor of tramadol . Has not required any tramadol since DC from [facility] several days ago.  She is using tylenol  times daily    Antihypertensives were suspended .  Last home BP was 126/68 before PT  nad Post exercise 136/68. PT coming 3 times weekly , receiving assistance for bath twice  week ,  HH RN from [facility] 2/week to check vital signs, and wound check. Occupational therapy pending. Having trouble wiping self , waiting for instruction use of assistive device   Using Keppra per Dr. Jannifer Franklin for management of neuropathy instead of Lyrica   calcium was low at 8.5 at discharge       Outpatient Medications Prior to Visit  Medication Sig Dispense Refill  . acetaminophen (TYLENOL) 650 MG CR tablet Take 650 mg by mouth every 8 (eight) hours as needed for pain.    [name] albuterol (PROVENTIL HFA;VENTOLIN HFA) 108 (90 Base) MCG/ACT inhaler Inhale 2 puffs into the lungs every 8 (eight) hours as needed for wheezing or shortness of breath. 1 Inhaler 0  . aspirin EC 81 MG tablet Take 1 tablet (81 mg total) by mouth daily. 90 tablet 3  . butalbital-acetaminophen-caffeine (FIORICET, ESGIC) 50-325-40 MG tablet Take 1 tablet by mouth every 6 (six) hours as needed for headache. 60 tablet 0  . Cholecalciferol (VITAMIN D PO) Take 5,000 Units by mouth every other day.     . diazepam (VALIUM) 5 MG tablet Take 1 tablet (5 mg total) by mouth at bedtime as needed for anxiety. 20 tablet 0  . DULoxetine (CYMBALTA) 30 MG capsule Take 1 capsule (30 mg total) by mouth daily. 180 capsule 1  . nystatin (NYAMYC) powder Apply topically 4 (four) times daily.    . ondansetron (ZOFRAN) 4 MG tablet Take 1 tablet (4 mg total) by mouth every 8 (eight) hours as needed for nausea or vomiting. 10  tablet 0  . pregabalin (LYRICA) 100 MG capsule Take 100 mg by mouth daily.    . rosuvastatin (CRESTOR) 10 MG tablet Take 1 tablet (10 mg total) by mouth daily. 90 tablet 3  . traMADol (ULTRAM) 50 MG tablet Take 0.5 tablets (25 mg total) by mouth every 12 (twelve) hours as needed for severe pain. 30 tablet 0  . levETIRAcetam (KEPPRA) 500 MG tablet Take 1 tablet (500 mg total) by mouth 2 (two) times daily. 60 tablet 1  . LUNESTA 2 MG TABS tablet Take 2 mg immediately before bedtime 30 tablet 0  . pregabalin (LYRICA) 100 MG  capsule Take 1 capsule (100 mg total) by mouth daily as needed (for neuropatic pain). 20 capsule 0  . sennosides-docusate sodium (SENOKOT-S) 8.6-50 MG tablet Take 2 tablets by mouth daily. (Patient not taking: Reported on 09/19/2017) 30 tablet 1  . Zinc Oxide (BOUDREAUXS BUTT PASTE EX) Apply 1 application topically at bedtime.     No facility-administered medications prior to visit.     Review of Systems;  Patient denies headache, fevers, malaise, unintentional weight loss, skin rash, eye pain, sinus congestion and sinus pain, sore throat, dysphagia,  hemoptysis , cough, dyspnea, wheezing, chest pain, palpitations, orthopnea, edema, abdominal pain, nausea, melena, diarrhea, constipation, flank pain, dysuria, hematuria, urinary  Frequency, nocturia, numbness, tingling, seizures,  Focal weakness, Loss of consciousness,  Tremor, insomnia, depression, anxiety, and suicidal ideation.      Objective:  BP 124/60 (BP Location: Left Arm, Patient Position: Sitting, Cuff Size: Large)   Pulse 78   Temp 98.4 F (36.9 C) (Oral)   Resp 15   Ht 5\' 4"  (1.626 m)   Wt 250 lb 3.2 oz (113.5 kg)   SpO2 95%   BMI 42.95 kg/m   BP Readings from Last 3 Encounters:  09/19/17 124/60  08/21/17 (!) 147/54  08/16/17 115/64    Wt Readings from Last 3 Encounters:  09/19/17 250 lb 3.2 oz (113.5 kg)  08/20/17 253 lb 8.5 oz (115 kg)  08/13/17 250 lb (113.4 kg)    General appearance: alert, cooperative and appears stated age Ears: normal TM's and external ear canals both ears Throat: lips, mucosa, and tongue normal; teeth and gums normal Neck: no adenopathy, no carotid bruit, supple, symmetrical, trachea midline and thyroid not enlarged, symmetric, no tenderness/mass/nodules Back: symmetric, no curvature. ROM normal. No CVA tenderness. Lungs: clear to auscultation bilaterally Heart: regular rate and rhythm, S1, S2 normal, no murmur, click, rub or gallop Abdomen: soft, non-tender; bowel sounds normal; no masses,   no organomegaly Pulses: 2+ and symmetric Skin: Skin color, texture, turgor normal. No rashes or lesions Lymph nodes: Cervical, supraclavicular, and axillary nodes normal.  Lab Results  Component Value Date   HGBA1C 7.6 (H) 07/30/2017   HGBA1C 8.1 (H) 06/04/2017   HGBA1C 7.4 (H) 03/04/2017    Lab Results  Component Value Date   CREATININE 0.87 09/19/2017   CREATININE 0.98 08/21/2017   CREATININE 1.08 (H) 08/20/2017   CREATININE 1.07 (H) 08/20/2017    Lab Results  Component Value Date   WBC 8.9 09/19/2017   HGB 11.7 (L) 09/19/2017   HCT 36.2 09/19/2017   PLT 239.0 09/19/2017   GLUCOSE 211 (H) 09/19/2017   CHOL 183 06/04/2017   TRIG 195.0 (H) 06/04/2017   HDL 48.50 06/04/2017   LDLDIRECT 111.0 06/16/2015   LDLCALC 96 06/04/2017   ALT 12 09/19/2017   AST 12 09/19/2017   NA 139 09/19/2017   K 3.8 09/19/2017  CL 102 09/19/2017   CREATININE 0.87 09/19/2017   BUN 17 09/19/2017   CO2 26 09/19/2017   TSH 2.279 08/20/2017   HGBA1C 7.6 (H) 07/30/2017   MICROALBUR 2.8 (H) 06/04/2017    Dg Shoulder Right  Result Date: 08/20/2017 CLINICAL DATA:  Right shoulder pain.  Fall EXAM: RIGHT SHOULDER - 2+ VIEW COMPARISON:  08/13/2017 FINDINGS: Prior right shoulder replacement. No hardware complicating feature or acute bony abnormality. No fracture, subluxation or dislocation. IMPRESSION: Prior right shoulder replacement.  No acute findings. Electronically Signed   By: Rolm Baptise M.D.   On: 08/20/2017 11:13   Ct Head Wo Contrast  Result Date: 08/19/2017 CLINICAL DATA:  Sudden mental status changes, currently unresponsive. EXAM: CT HEAD WITHOUT CONTRAST TECHNIQUE: Contiguous axial images were obtained from the base of the skull through the vertex without intravenous contrast. COMPARISON:  02/17/2017 FINDINGS: Brain: Age related atrophy. Chronic appearing small vessel ischemic changes the hemispheric white matter. No sign of acute infarction, mass lesion, hemorrhage, hydrocephalus or  extra-axial collection. Vascular: There is atherosclerotic calcification of the major vessels at the base of the brain. Skull: Normal Sinuses/Orbits: Clear/normal Other: None IMPRESSION: No acute finding by CT. Age related atrophy and mild chronic small-vessel ischemic change of the white matter. Electronically Signed   By: Nelson Chimes M.D.   On: 08/19/2017 20:41   Ct Angio Chest Pe W Or Wo Contrast  Result Date: 08/20/2017 CLINICAL DATA:  Elevated D-dimer.  Recent shoulder surgery. EXAM: CT ANGIOGRAPHY CHEST WITH CONTRAST TECHNIQUE: Multidetector CT imaging of the chest was performed using the standard protocol during bolus administration of intravenous contrast. Multiplanar CT image reconstructions and MIPs were obtained to evaluate the vascular anatomy. CONTRAST:  13mL ISOVUE-370 IOPAMIDOL (ISOVUE-370) INJECTION 76% COMPARISON:  Chest radiograph, 08/19/2017.  Chest CTA, 12/07/2015. FINDINGS: Cardiovascular: Satisfactory opacification of the pulmonary arteries to the segmental level. No evidence of pulmonary embolism. Normal heart size. No pericardial effusion. Mild coronary artery calcifications. Great vessels normal in caliber. Aorta is not opacified. No aortic atherosclerotic calcifications. Mediastinum/Nodes: Visualized thyroid is unremarkable. No mediastinal or hilar masses. No pathologically enlarged lymph nodes. Trachea is widely patent. Esophagus is unremarkable. Lungs/Pleura: There is right lower lobe opacity most consistent with atelectasis. Minimal subsegmental atelectasis noted in the dependent left lower lobe. Lungs otherwise clear. Trace right pleural effusion. No left pleural effusion. No pneumothorax. Upper Abdomen: Changes from prior gastric surgery stable from the prior CT. 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 No acute findings in the visible upper abdomen. Musculoskeletal: There is edema surrounding the new right shoulder prosthesis. No acute fracture. No  osteoblastic or osteolytic lesions. Review of the MIP images confirms the above findings. IMPRESSION: 1. No evidence of a pulmonary embolism. 2. Right lower lobe atelectasis and minimal subsegmental left lower lobe atelectasis. No convincing pneumonia. No pulmonary edema. Trace right pleural effusion. Electronically Signed   By: Lajean Manes M.D.   On: 08/20/2017 17:19   Dg Chest Portable 1 View  Result Date: 08/19/2017 CLINICAL DATA:  Recent shoulder surgery. Altered mental status today. EXAM: PORTABLE CHEST 1 VIEW COMPARISON:  06/13/2017. FINDINGS: Poor inspiration. Allowing for that, the heart and mediastinum are normal in the lungs are clear. No collapse or visible infiltrate. Reverse shoulder replacement on the right. IMPRESSION: Poor inspiration.  No active disease suspected. Electronically Signed   By: Nelson Chimes M.D.   On: 08/19/2017 20:35    Assessment & Plan:   Problem List Items Addressed This  Visit    Anemia   Relevant Orders   Iron, TIBC and Ferritin Panel (Completed)   Vitamin B12 (Completed)   CBC with Differential/Platelet (Completed)   B12 deficiency    b12 deficiency diagnosed. patient will need  Weekly B12 injections; will see if home health RN can administer this week       DM type 2 with diabetic peripheral neuropathy ([place])    Recent los of control due to dietary indiscretions and immobility..  Has Resume glipizide bid,  rtc 2 months . Diet dicussed with patient today .  Lab Results  Component Value Date   HGBA1C 7.6 (H) 07/30/2017         Relevant Medications   Eszopiclone 3 MG TABS   levETIRAcetam (KEPPRA) 500 MG tablet   pregabalin (LYRICA) 100 MG capsule   Fecal incontinence    May be secondary to continued use of Sennakot despite dc opioids.  History  of constipation.  Advised to dc sennakot and use colace.       Hospital discharge follow-up    Patient is stable post discharge and has no new issues or questions about discharge plans at the visit today  . All labs , imaging studies and progress notes from admission were reviewed with patient today        Hypocalcemia    Transient, noted at discharge,  Now resolved  Lab Results  Component Value Date   CALCIUM 9.0 09/19/2017   CAION 4.8 09/19/2017        Relevant Orders   Calcium, ionized (Completed)   Hypomagnesemia    Recurrent,  With normal potassium.  Mg oxide recommended.       Hypotension (arterial)    She remains normotensive after ER admission for hypotension and metabolic encephalopathy.  Advised to continue suspension of bp meds until home readings are persistently > 140/90 (to be checked by home health weekly/twice weekly)      Insomnia secondary to anxiety    Managed with Lunesta and prn valium.  Using CPAP nightly       Rotator cuff arthropathy, right    S/p reverse arthroplasty left shoulder Jan 2019. by Dr. Mardelle Matte . Incisional site is healing well without signs of infection.  Pain is controlled with tylenol.  Receiving PT 3/week        Other Visit Diagnoses    Acute renal failure, unspecified acute renal failure type ([place])    -  Primary   Relevant Orders   Comprehensive metabolic panel (Completed)   Diarrhea, unspecified type       Relevant Orders   Magnesium (Completed)      I have discontinued Tia Masker. Alvis's Zinc Oxide (BOUDREAUXS BUTT PASTE EX), LUNESTA, and sennosides-docusate sodium. I am also having her start on Eszopiclone. Additionally, I am having her maintain her albuterol, Cholecalciferol (VITAMIN D PO), butalbital-acetaminophen-caffeine, aspirin EC, rosuvastatin, ondansetron, DULoxetine, traMADol, diazepam, levETIRAcetam, nystatin, acetaminophen, and pregabalin.  Meds ordered this encounter  Medications  . Eszopiclone 3 MG TABS    Sig: Take 1 tablet (3 mg total) by mouth at bedtime. Take immediately before bedtime    Dispense:  90 tablet    Refill:  0  . levETIRAcetam (KEPPRA) 500 MG tablet    Sig: Take 1 tablet (500 mg total) by  mouth 2 (two) times daily.    Dispense:  180 tablet    Refill:  1    Medications Discontinued During This Encounter  Medication Reason  . LUNESTA 2 MG  TABS tablet   . pregabalin (LYRICA) 100 MG capsule   . levETIRAcetam (KEPPRA) 500 MG tablet Reorder  . sennosides-docusate sodium (SENOKOT-S) 8.6-50 MG tablet   . Zinc Oxide (BOUDREAUXS BUTT PASTE EX) Patient has not taken in last 30 days   A total of 25 minutes of face to face time was spent with patient more than half of which was spent in counselling about the above mentioned conditions  and coordination of care  Follow-up: Return in about 2 months (around 11/19/2017) for diabetes follow up after April 15 .   [name], MD

## 2017-09-19 NOTE — Patient Instructions (Addendum)
No more Sennakot-S .  This is a stimulant laxative  Use either colace (docusate ) 100 to 200 mg or miralax at night to prevent constipation  Do not resume blood pressure medications .  Call when bp is > 140/80  Return for diabetes follow up in late April   Thank you for the pen and Scripture!

## 2017-09-20 DIAGNOSIS — I10 Essential (primary) hypertension: Secondary | ICD-10-CM | POA: Diagnosis not present

## 2017-09-20 DIAGNOSIS — M4802 Spinal stenosis, cervical region: Secondary | ICD-10-CM | POA: Diagnosis not present

## 2017-09-20 DIAGNOSIS — I4891 Unspecified atrial fibrillation: Secondary | ICD-10-CM | POA: Diagnosis not present

## 2017-09-20 DIAGNOSIS — I2510 Atherosclerotic heart disease of native coronary artery without angina pectoris: Secondary | ICD-10-CM | POA: Diagnosis not present

## 2017-09-20 DIAGNOSIS — Z471 Aftercare following joint replacement surgery: Secondary | ICD-10-CM | POA: Diagnosis not present

## 2017-09-20 DIAGNOSIS — E1142 Type 2 diabetes mellitus with diabetic polyneuropathy: Secondary | ICD-10-CM | POA: Diagnosis not present

## 2017-09-20 LAB — IRON,TIBC AND FERRITIN PANEL
%SAT: 22 % (calc) (ref 11–50)
Ferritin: 69 ng/mL (ref 20–288)
Iron: 59 ug/dL (ref 45–160)
TIBC: 269 mcg/dL (calc) (ref 250–450)

## 2017-09-20 LAB — CALCIUM, IONIZED: Calcium, Ion: 4.8 mg/dL (ref 4.8–5.6)

## 2017-09-21 DIAGNOSIS — E1142 Type 2 diabetes mellitus with diabetic polyneuropathy: Secondary | ICD-10-CM | POA: Diagnosis not present

## 2017-09-21 DIAGNOSIS — Z471 Aftercare following joint replacement surgery: Secondary | ICD-10-CM | POA: Diagnosis not present

## 2017-09-21 DIAGNOSIS — I4891 Unspecified atrial fibrillation: Secondary | ICD-10-CM | POA: Diagnosis not present

## 2017-09-21 DIAGNOSIS — I10 Essential (primary) hypertension: Secondary | ICD-10-CM | POA: Diagnosis not present

## 2017-09-21 DIAGNOSIS — M4802 Spinal stenosis, cervical region: Secondary | ICD-10-CM | POA: Diagnosis not present

## 2017-09-21 DIAGNOSIS — I2510 Atherosclerotic heart disease of native coronary artery without angina pectoris: Secondary | ICD-10-CM | POA: Diagnosis not present

## 2017-09-22 DIAGNOSIS — R159 Full incontinence of feces: Secondary | ICD-10-CM | POA: Insufficient documentation

## 2017-09-22 DIAGNOSIS — E538 Deficiency of other specified B group vitamins: Secondary | ICD-10-CM | POA: Insufficient documentation

## 2017-09-22 DIAGNOSIS — Z09 Encounter for follow-up examination after completed treatment for conditions other than malignant neoplasm: Secondary | ICD-10-CM | POA: Insufficient documentation

## 2017-09-22 DIAGNOSIS — I959 Hypotension, unspecified: Secondary | ICD-10-CM | POA: Insufficient documentation

## 2017-09-22 MED ORDER — "SYRINGE 25G X 1"" 3 ML MISC": 0 refills | Status: DC

## 2017-09-22 MED ORDER — MAGNESIUM OXIDE 400 MG PO TABS: 400.0000 mg | ORAL_TABLET | Freq: Two times a day (BID) | ORAL | 0 refills | Status: DC

## 2017-09-22 MED ORDER — CYANOCOBALAMIN 1000 MCG/ML IJ SOLN
INTRAMUSCULAR | 0 refills | Status: DC
Start: 2017-09-22 — End: 2017-09-23

## 2017-09-22 NOTE — Assessment & Plan Note (Signed)
She remains normotensive after ER admission for hypotension and metabolic encephalopathy.  Advised to continue suspension of bp meds until home readings are persistently > 140/90 (to be checked by home health weekly/twice weekly)

## 2017-09-22 NOTE — Assessment & Plan Note (Signed)
Patient is stable post discharge and has no new issues or questions about discharge plans at the visit today . All labs , imaging studies and progress notes from admission were reviewed with patient today

## 2017-09-22 NOTE — Assessment & Plan Note (Signed)
Recurrent,  With normal potassium.  Mg oxide recommended.

## 2017-09-22 NOTE — Assessment & Plan Note (Signed)
b12 deficiency diagnosed. patient will need  Weekly B12 injections; will see if home health RN can administer this week

## 2017-09-22 NOTE — Assessment & Plan Note (Signed)
Recent los of control due to dietary indiscretions and immobility..  Has Resume glipizide bid,  rtc 2 months . Diet dicussed with patient today .  Lab Results  Component Value Date   HGBA1C 7.6 (H) 07/30/2017

## 2017-09-22 NOTE — Assessment & Plan Note (Signed)
May be secondary to continued use of Sennakot despite dc opioids.  History  of constipation.  Advised to dc sennakot and use colace.

## 2017-09-22 NOTE — Assessment & Plan Note (Signed)
Managed with Lunesta and prn valium.  Using CPAP nightly

## 2017-09-22 NOTE — Assessment & Plan Note (Signed)
Transient, noted at discharge,  Now resolved  Lab Results  Component Value Date   CALCIUM 9.0 09/19/2017   CAION 4.8 09/19/2017

## 2017-09-22 NOTE — Assessment & Plan Note (Signed)
S/p reverse arthroplasty left shoulder Jan 2019. by Dr. Mardelle Matte . Incisional site is healing well without signs of infection.  Pain is controlled with tylenol.  Receiving PT 3/week

## 2017-09-23 ENCOUNTER — Other Ambulatory Visit: Payer: Self-pay | Admitting: *Deleted

## 2017-09-23 DIAGNOSIS — M19011 Primary osteoarthritis, right shoulder: Secondary | ICD-10-CM | POA: Diagnosis not present

## 2017-09-23 MED ORDER — CYANOCOBALAMIN 1000 MCG/ML IJ SOLN: INTRAMUSCULAR | 0 refills | Status: DC

## 2017-09-23 MED ORDER — "SYRINGE 25G X 1"" 3 ML MISC": 0 refills | Status: DC

## 2017-09-24 DIAGNOSIS — M25611 Stiffness of right shoulder, not elsewhere classified: Secondary | ICD-10-CM | POA: Diagnosis not present

## 2017-09-24 DIAGNOSIS — M4802 Spinal stenosis, cervical region: Secondary | ICD-10-CM | POA: Diagnosis not present

## 2017-09-24 DIAGNOSIS — I10 Essential (primary) hypertension: Secondary | ICD-10-CM | POA: Diagnosis not present

## 2017-09-24 DIAGNOSIS — M6281 Muscle weakness (generalized): Secondary | ICD-10-CM | POA: Diagnosis not present

## 2017-09-24 DIAGNOSIS — I4891 Unspecified atrial fibrillation: Secondary | ICD-10-CM | POA: Diagnosis not present

## 2017-09-24 DIAGNOSIS — M25511 Pain in right shoulder: Secondary | ICD-10-CM | POA: Diagnosis not present

## 2017-09-24 DIAGNOSIS — E1142 Type 2 diabetes mellitus with diabetic polyneuropathy: Secondary | ICD-10-CM | POA: Diagnosis not present

## 2017-09-24 DIAGNOSIS — I2510 Atherosclerotic heart disease of native coronary artery without angina pectoris: Secondary | ICD-10-CM | POA: Diagnosis not present

## 2017-09-24 DIAGNOSIS — Z471 Aftercare following joint replacement surgery: Secondary | ICD-10-CM | POA: Diagnosis not present

## 2017-09-26 DIAGNOSIS — M25611 Stiffness of right shoulder, not elsewhere classified: Secondary | ICD-10-CM | POA: Diagnosis not present

## 2017-09-26 DIAGNOSIS — M25511 Pain in right shoulder: Secondary | ICD-10-CM | POA: Diagnosis not present

## 2017-09-26 DIAGNOSIS — M6281 Muscle weakness (generalized): Secondary | ICD-10-CM | POA: Diagnosis not present

## 2017-09-30 DIAGNOSIS — M6281 Muscle weakness (generalized): Secondary | ICD-10-CM | POA: Diagnosis not present

## 2017-09-30 DIAGNOSIS — M25611 Stiffness of right shoulder, not elsewhere classified: Secondary | ICD-10-CM | POA: Diagnosis not present

## 2017-09-30 DIAGNOSIS — M25511 Pain in right shoulder: Secondary | ICD-10-CM | POA: Diagnosis not present

## 2017-10-08 DIAGNOSIS — M25511 Pain in right shoulder: Secondary | ICD-10-CM | POA: Diagnosis not present

## 2017-10-08 DIAGNOSIS — M25611 Stiffness of right shoulder, not elsewhere classified: Secondary | ICD-10-CM | POA: Diagnosis not present

## 2017-10-08 DIAGNOSIS — M6281 Muscle weakness (generalized): Secondary | ICD-10-CM | POA: Diagnosis not present

## 2017-10-10 ENCOUNTER — Ambulatory Visit: Payer: Medicare Other | Admitting: Podiatry

## 2017-10-10 ENCOUNTER — Ambulatory Visit: Payer: Medicare Other | Admitting: Internal Medicine

## 2017-10-10 DIAGNOSIS — M25611 Stiffness of right shoulder, not elsewhere classified: Secondary | ICD-10-CM | POA: Diagnosis not present

## 2017-10-10 DIAGNOSIS — M6281 Muscle weakness (generalized): Secondary | ICD-10-CM | POA: Diagnosis not present

## 2017-10-10 DIAGNOSIS — M25511 Pain in right shoulder: Secondary | ICD-10-CM | POA: Diagnosis not present

## 2017-10-14 ENCOUNTER — Ambulatory Visit (INDEPENDENT_AMBULATORY_CARE_PROVIDER_SITE_OTHER): Payer: Medicare Other | Admitting: Podiatry

## 2017-10-14 ENCOUNTER — Encounter: Payer: Self-pay | Admitting: Podiatry

## 2017-10-14 ENCOUNTER — Other Ambulatory Visit: Payer: Self-pay | Admitting: Podiatry

## 2017-10-14 ENCOUNTER — Ambulatory Visit (INDEPENDENT_AMBULATORY_CARE_PROVIDER_SITE_OTHER): Payer: Medicare Other

## 2017-10-14 DIAGNOSIS — E11610 Type 2 diabetes mellitus with diabetic neuropathic arthropathy: Secondary | ICD-10-CM

## 2017-10-14 DIAGNOSIS — M129 Arthropathy, unspecified: Secondary | ICD-10-CM | POA: Diagnosis not present

## 2017-10-14 DIAGNOSIS — M79676 Pain in unspecified toe(s): Secondary | ICD-10-CM

## 2017-10-14 DIAGNOSIS — B351 Tinea unguium: Secondary | ICD-10-CM

## 2017-10-14 DIAGNOSIS — E1149 Type 2 diabetes mellitus with other diabetic neurological complication: Secondary | ICD-10-CM | POA: Diagnosis not present

## 2017-10-14 MED ORDER — NONFORMULARY OR COMPOUNDED ITEM: 3 refills | Status: DC

## 2017-10-14 NOTE — Progress Notes (Addendum)
This patient presents to the office for an evaluation of her diabetic feet.  She has previously been diagnosed by myself as having a Charcot foot, right foot.  This diagnosis was made in December.  She is immobilized toe right foot until she had shoulder surgery on her right side.  She says that she spent time in rehabilitation facility where she did perform minimal physical therapy and was mostly non weight bearing.  She says she is not having any pain or discomfort in her right foot.  She says she has developed a painful bony growth on the top of her left foot.  She says this is painful walking and wearing her shoes.  She also presents the office for preventative foot care services   General Appearance  Alert, conversant and in no acute stress.  Vascular  Dorsalis pedis and posterior tibial  pulses are palpable  bilaterally.  Capillary return is within normal limits  bilaterally. Temperature is within normal limits  bilaterally.  Neurologic  Senn-Weinstein monofilament wire test was absent. bilaterally. Muscle power within normal limits bilaterally.  Nails Thick disfigured discolored nails with subungual debris  from hallux to fifth toes bilaterally. No evidence of bacterial infection or drainage bilaterally.  Orthopedic  Chronic arthropathy noted on the dorsum of the right foot consistent with a Charcot foot.  There is some mild swelling and minimal increased temperature noted.  No palpable pain noted at the site of the Charcot foot.  There is a bony exostosis noted on the dorsum of the left foot.  No swelling or increased temperature is noted.  Skin  normotropic skin with no porokeratosis noted bilaterally.  No signs of infections or ulcers noted.     Charcot Foot  B/L  Diabetes with neuropathy.  Onychomycosis  B/L  Debridement of nails  X 10.  X-rays were  taken of both the right and left foot.  There is evidence of Charcot foot breakdown in the rear foot complex bilaterally.  Discussed her  Charcot foot with this patient.  Told her she can be active to tolerance.  She should to return to the office in 3 months for preventative foot care services.  If she has any problems with her Charcot foot she should call and make an appointment with this office. ABN signed for 2019.   Gardiner Barefoot DPM

## 2017-10-17 DIAGNOSIS — M25611 Stiffness of right shoulder, not elsewhere classified: Secondary | ICD-10-CM | POA: Diagnosis not present

## 2017-10-17 DIAGNOSIS — M6281 Muscle weakness (generalized): Secondary | ICD-10-CM | POA: Diagnosis not present

## 2017-10-17 DIAGNOSIS — M25511 Pain in right shoulder: Secondary | ICD-10-CM | POA: Diagnosis not present

## 2017-10-21 DIAGNOSIS — M25611 Stiffness of right shoulder, not elsewhere classified: Secondary | ICD-10-CM | POA: Diagnosis not present

## 2017-10-22 DIAGNOSIS — M6281 Muscle weakness (generalized): Secondary | ICD-10-CM | POA: Diagnosis not present

## 2017-10-22 DIAGNOSIS — M25511 Pain in right shoulder: Secondary | ICD-10-CM | POA: Diagnosis not present

## 2017-10-22 DIAGNOSIS — M25611 Stiffness of right shoulder, not elsewhere classified: Secondary | ICD-10-CM | POA: Diagnosis not present

## 2017-10-24 DIAGNOSIS — M25511 Pain in right shoulder: Secondary | ICD-10-CM | POA: Diagnosis not present

## 2017-10-24 DIAGNOSIS — M25611 Stiffness of right shoulder, not elsewhere classified: Secondary | ICD-10-CM | POA: Diagnosis not present

## 2017-10-24 DIAGNOSIS — M6281 Muscle weakness (generalized): Secondary | ICD-10-CM | POA: Diagnosis not present

## 2017-10-29 DIAGNOSIS — M25511 Pain in right shoulder: Secondary | ICD-10-CM | POA: Diagnosis not present

## 2017-10-29 DIAGNOSIS — M25611 Stiffness of right shoulder, not elsewhere classified: Secondary | ICD-10-CM | POA: Diagnosis not present

## 2017-10-29 DIAGNOSIS — M6281 Muscle weakness (generalized): Secondary | ICD-10-CM | POA: Diagnosis not present

## 2017-10-30 ENCOUNTER — Encounter: Payer: Self-pay | Admitting: Internal Medicine

## 2017-10-31 ENCOUNTER — Ambulatory Visit (INDEPENDENT_AMBULATORY_CARE_PROVIDER_SITE_OTHER): Payer: Medicare Other | Admitting: Internal Medicine

## 2017-10-31 ENCOUNTER — Encounter: Payer: Self-pay | Admitting: Internal Medicine

## 2017-10-31 VITALS — BP 140/90 | HR 77 | Ht 64.0 in | Wt 254.4 lb

## 2017-10-31 DIAGNOSIS — Z9989 Dependence on other enabling machines and devices: Secondary | ICD-10-CM

## 2017-10-31 DIAGNOSIS — M25611 Stiffness of right shoulder, not elsewhere classified: Secondary | ICD-10-CM | POA: Diagnosis not present

## 2017-10-31 DIAGNOSIS — M6281 Muscle weakness (generalized): Secondary | ICD-10-CM | POA: Diagnosis not present

## 2017-10-31 DIAGNOSIS — G4733 Obstructive sleep apnea (adult) (pediatric): Secondary | ICD-10-CM

## 2017-10-31 DIAGNOSIS — M25511 Pain in right shoulder: Secondary | ICD-10-CM | POA: Diagnosis not present

## 2017-10-31 NOTE — Progress Notes (Signed)
HPI F never smoker followed for OSA, Hypersomnia, Insomnia complicated by IBS NPSG 03/18/39 at [facility]/ [facility]   Mild OSA, AHI 13.2/ hr, weight 218 lbs   CPAP 6 NPSG 05/22/15- CPAP Titration study- Optimal 7 cwp,  MSLT 05/23/15 ( she had taken Lyrica the night before during CPAP titration against protocol). 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 Melvyn Novas for pulmonary, complicated by DM 2/peripheral neuropathy, degenerative disc disease, pulmonary nodules, DVT 2017, CPAP 6/[facility] > today replace machine, auto 4-10 Valium, Lunesta, Zanaflex, Requip Pt is having more SOB with exertion than normal in last few months. Needs surgical clearance pending total right shoulder replacement. She has noticed some increase in dyspnea on exertion with no acute event or obvious infection, chest pain, palpitation.  She blames inactivity, very sedentary..  Some dry cough relieved by benzonatate which she associates with increased reflux while lying in bed.  She has a Sleep Number bed which she can elevate if she wants to.  Right foot is in a walking boot after fracture. She reports CPAP is comfortable and working well.  Her husband confirms it prevents snoring.  Download indicates 86.7% compliance but does not provide AHI. Machine is older than 5 years and she asks about replacement.  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 Melvyn Novas for pulmonary, complicated by DM 2/peripheral neuropathy, degenerative disc disease, pulmonary nodules, DVT 2017, CPAP  Using older machine. Was to  have replaced with auto 4-10/ [facility] ----Pt stated she coded( over medicated?) September 09, 2017 and was in and out of hospital and nursing home from February 21 and pt states doctors are trying to figure out if it is because of meds.  Pt states she is not sleeping well at night and states she feels like she is getting about 3 hours of sleep at night. CPAP compliance download 100%, averaging 7-1/2 hours use per day, AHI not reported. Machine is old.  Changing now to Auto 4-10/[facility] Reports frequent sneezing but thinks Flonase makes her sleepy. CTachest 08/20/17 IMPRESSION: 1. No evidence of a pulmonary embolism. 2. Right lower lobe atelectasis and minimal subsegmental left lower lobe atelectasis. No convincing pneumonia. No pulmonary edema. Trace right pleural effusion.  ROS-see HPI   + = positive Constitutional:    weight loss, night sweats, fevers, chills,+ fatigue, lassitude. HEENT:   + headaches, +ifficulty swallowing, tooth/dental problems, sore throat,       sneezing, itching, ear ache, +nasal congestion, post nasal drip, snoring CV:    chest pain, orthopnea, PND, + swelling in lower extremities, anasarca,                                                    dizziness, palpitations Resp:  +shortness of breath with exertion or at rest.                productive cough,   non-productive cough, coughing up of blood.  change in color of mucus.  wheezing.   Skin:    rash or lesions. GI:  + IBS GU: dysuria, change in color of urine, no urgency or frequency.  MS:   +joint pain, stiffness, decreased range of motion, back pain. Neuro-     nothing unusual Psych:  change in mood or affect.  depression or anxiety.   memory loss.  OBJ- Physical Exam General- Alert, Oriented, Affect-appropriate, Distress- none acute, + morbidly obese, wheelchair Skin- rash-none, lesions- none, excoriation- none. + Herpetic fever blister lip Lymphadenopathy- none Head- atraumatic            Eyes-  Gross vision intact, PERRLA, conjunctivae and secretions clear            Ears- Hearing, canals-normal            Nose- Clear, no-Septal dev, mucus, polyps, erosion, perforation             Throat- Mallampati III , mucosa clear , drainage- none, tonsils- atrophic, + dentures Neck- flexible , trachea midline, no stridor , thyroid nl, carotid no bruit Chest - symmetrical excursion , unlabored           Heart/CV- RRR , no murmur , no gallop  , no rub, nl s1 s2                           - JVD- none , edema , stasis changes- none, varices- none           Lung- clear to P&A, wheeze- none, cough- none , dullness-none, rub- none, rales-none,                     +O2 sat 98% RA           Chest wall-  Abd-  Br/ Gen/ Rectal- Not done, not indicated Extrem- cyanosis- none, clubbing, none, atrophy- none, strength- nl, + right foot in walking boot Neuro- grossly intact to observation

## 2017-10-31 NOTE — Patient Instructions (Signed)
Order- DME [facility]  Please replace old CPAP machine, change to auto 4- 10, mask of choice, humidifier, supplies, AirView    Dx OSA  Try otc saline nasal spray as needed for stuffy nasal discomfort  Try using Flonase 1-2 puffs each nostril, at bedtime. That way, even if it makes you drowsy, it will help.

## 2017-11-05 NOTE — Assessment & Plan Note (Signed)
Compliance is good but she needs a new machine.  We discussed change to AutoPap for flexibility. Plan-replace old CPAP machine with auto 4-10

## 2017-11-08 ENCOUNTER — Telehealth: Payer: Self-pay

## 2017-11-08 NOTE — Telephone Encounter (Signed)
Called patient to confirm AWV. Patient stated she had a sore throat and would need to be seen by a doctor on Monday instead of the nurse for the AWV and would like to cancel the AWV at this time if so recommended.  She then stated she was going to a concert and requested someone text or call her on Monday morning with a scheduled appointment and hung up the phone.  I called back and left a voicemail letting her know her pcps schedule was full on Monday, but she could call back and schedule with another physician in the office if there is availability.  If not, she would need to go to urgent care.  It was recommended she reschedule the AWV if she is still ill on Monday for a later date.

## 2017-11-11 ENCOUNTER — Ambulatory Visit: Payer: Medicare Other

## 2017-11-11 NOTE — Telephone Encounter (Signed)
Patient called to cancel, states she is not feeling any better

## 2017-11-15 DIAGNOSIS — H6980 Other specified disorders of Eustachian tube, unspecified ear: Secondary | ICD-10-CM | POA: Diagnosis not present

## 2017-11-15 DIAGNOSIS — J069 Acute upper respiratory infection, unspecified: Secondary | ICD-10-CM | POA: Diagnosis not present

## 2017-11-19 ENCOUNTER — Ambulatory Visit (INDEPENDENT_AMBULATORY_CARE_PROVIDER_SITE_OTHER): Payer: Medicare Other

## 2017-11-19 ENCOUNTER — Ambulatory Visit (INDEPENDENT_AMBULATORY_CARE_PROVIDER_SITE_OTHER): Payer: Medicare Other | Admitting: Internal Medicine

## 2017-11-19 ENCOUNTER — Encounter: Payer: Self-pay | Admitting: Internal Medicine

## 2017-11-19 VITALS — BP 140/88 | HR 81 | Temp 98.6°F | Resp 15 | Ht 64.0 in | Wt 259.0 lb

## 2017-11-19 DIAGNOSIS — J069 Acute upper respiratory infection, unspecified: Secondary | ICD-10-CM

## 2017-11-19 DIAGNOSIS — E1129 Type 2 diabetes mellitus with other diabetic kidney complication: Secondary | ICD-10-CM | POA: Diagnosis not present

## 2017-11-19 DIAGNOSIS — B9789 Other viral agents as the cause of diseases classified elsewhere: Secondary | ICD-10-CM

## 2017-11-19 DIAGNOSIS — Z6841 Body Mass Index (BMI) 40.0 and over, adult: Secondary | ICD-10-CM | POA: Diagnosis not present

## 2017-11-19 DIAGNOSIS — E1142 Type 2 diabetes mellitus with diabetic polyneuropathy: Secondary | ICD-10-CM

## 2017-11-19 DIAGNOSIS — R809 Proteinuria, unspecified: Secondary | ICD-10-CM

## 2017-11-19 DIAGNOSIS — R0989 Other specified symptoms and signs involving the circulatory and respiratory systems: Secondary | ICD-10-CM | POA: Diagnosis not present

## 2017-11-19 DIAGNOSIS — F419 Anxiety disorder, unspecified: Secondary | ICD-10-CM | POA: Diagnosis not present

## 2017-11-19 DIAGNOSIS — M25511 Pain in right shoulder: Secondary | ICD-10-CM | POA: Diagnosis not present

## 2017-11-19 DIAGNOSIS — M25611 Stiffness of right shoulder, not elsewhere classified: Secondary | ICD-10-CM | POA: Diagnosis not present

## 2017-11-19 DIAGNOSIS — F5105 Insomnia due to other mental disorder: Secondary | ICD-10-CM

## 2017-11-19 DIAGNOSIS — M6281 Muscle weakness (generalized): Secondary | ICD-10-CM | POA: Diagnosis not present

## 2017-11-19 LAB — CBC WITH DIFFERENTIAL/PLATELET
Basophils Absolute: 0.1 10*3/uL (ref 0.0–0.1)
Basophils Relative: 0.6 % (ref 0.0–3.0)
Eosinophils Absolute: 0.2 10*3/uL (ref 0.0–0.7)
Eosinophils Relative: 1.5 % (ref 0.0–5.0)
HCT: 36.7 % (ref 36.0–46.0)
Hemoglobin: 11.9 g/dL — ABNORMAL LOW (ref 12.0–15.0)
Lymphocytes Relative: 27.2 % (ref 12.0–46.0)
Lymphs Abs: 2.8 10*3/uL (ref 0.7–4.0)
MCHC: 32.4 g/dL (ref 30.0–36.0)
MCV: 88.2 fl (ref 78.0–100.0)
Monocytes Absolute: 0.5 10*3/uL (ref 0.1–1.0)
Monocytes Relative: 4.6 % (ref 3.0–12.0)
Neutro Abs: 6.8 10*3/uL (ref 1.4–7.7)
Neutrophils Relative %: 66.1 % (ref 43.0–77.0)
Platelets: 286 10*3/uL (ref 150.0–400.0)
RBC: 4.16 Mil/uL (ref 3.87–5.11)
RDW: 15 % (ref 11.5–15.5)
WBC: 10.3 10*3/uL (ref 4.0–10.5)

## 2017-11-19 LAB — COMPREHENSIVE METABOLIC PANEL
ALT: 21 U/L (ref 0–35)
AST: 23 U/L (ref 0–37)
Albumin: 3.5 g/dL (ref 3.5–5.2)
Alkaline Phosphatase: 104 U/L (ref 39–117)
BUN: 14 mg/dL (ref 6–23)
CO2: 26 mEq/L (ref 19–32)
Calcium: 8.7 mg/dL (ref 8.4–10.5)
Chloride: 103 mEq/L (ref 96–112)
Creatinine, Ser: 0.82 mg/dL (ref 0.40–1.20)
GFR: 72.8 mL/min (ref >60.00)
Glucose, Bld: 242 mg/dL — ABNORMAL HIGH (ref 70–99)
Potassium: 3.7 mEq/L (ref 3.5–5.1)
Sodium: 139 mEq/L (ref 135–145)
Total Bilirubin: 0.2 mg/dL (ref 0.2–1.2)
Total Protein: 6.8 g/dL (ref 6.0–8.3)

## 2017-11-19 LAB — HEMOGLOBIN A1C: Hgb A1c MFr Bld: 9.1 % — ABNORMAL HIGH (ref 4.6–6.5)

## 2017-11-19 MED ORDER — MOMETASONE FUROATE 0.1 % EX OINT: TOPICAL_OINTMENT | Freq: Every day | CUTANEOUS | 0 refills | Status: DC

## 2017-11-19 MED ORDER — PREDNISONE 10 MG PO TABS: ORAL_TABLET | ORAL | 0 refills | Status: DC

## 2017-11-19 MED ORDER — DIAZEPAM 5 MG PO TABS: 5.0000 mg | ORAL_TABLET | Freq: Every evening | ORAL | 0 refills | Status: DC | PRN

## 2017-11-19 MED ORDER — BENZONATATE 200 MG PO CAPS: 200.0000 mg | ORAL_CAPSULE | Freq: Two times a day (BID) | ORAL | 0 refills | Status: DC | PRN

## 2017-11-19 NOTE — Progress Notes (Signed)
Subjective:  Patient ID: Denise Sharp, female    DOB: [birth year]  Age: 72 y.o. MRN: [id number]  CC: The primary encounter diagnosis was DM type 2 with diabetic peripheral neuropathy ([place]). Diagnoses of Viral upper respiratory tract infection, Viral URI with cough, Insomnia secondary to anxiety, Microalbuminuria due to type 2 diabetes mellitus ([place]), and Class 3 severe obesity due to excess calories with serious comorbidity and body mass index (BMI) of 40.0 to 44.9 in adult [facility]) were also pertinent to this visit.  HPI Denise Sharp presents for 2 month follow up on diabetes and other issues  .    1) she has been having ear pain  Accompanied by  Cervical lymphadenopathy. Has already seen ENT.  Dissatisfied with their lack of plan.  Says she has been waking up screaming with pain   2) Requesting refill of valium at higher dose ( wants to increase to 10 mg )  . Uses it for  Management of insomnia but also taking Benadryl, Lunesta at 3 mg,  Lyrica and Requip,  has treated OSA , Obesity hypoventilation syndrome, and a  history of hypersomnolence  Including a recent hospital admission in February for  Unresponsiveness while rehabbing from shoulder surgery at [facility].    She recalls that she  was taking 10 mg valium daily for years and doesn't understand why she can't resume the higher dose.  Sees [name] in a few weeks  Right sided Earache started 2 weeks ago ,  saw Environmental consultant.  Exam was normal . Told to continue using antihistamine steroid nasal spray.  Not having fevers  But reports subjective chills  and  Chest hurts,  Coughing all night ,  Ribs hurt from coughing.  Using  Flonase spray sambucol (homeopathic remedy for cough and congestion, otc) and OTC antihistamine      Patient is not following a low glycemic index diet Since her right shoulder surgery (her husband is doing the cooking) and taking all prescribed medications regularly without side effects.  She states that she has been  checking sugars less frequently ,  But her fasting sugars have been under less than 140 most of the time and post prandials have been under 180 except on rare occasions. Patient is very sedentary and was non weight bearing for several moths concerned about her inability to lose weight.    Patient has had an eye exam in the last 12 months and sees podiatry regularly for bilaeral charcot foot .  Patient does not walk barefoot outside, and has diabetic neuropathy resulting in loss of sensation to both feet. . Patient is up to date on all recommended vaccinations  Has not taken a BP medication since January.  Home readings have been 125/65  Taking b12 injections twice monthly  Exercise limited by Charcot foot bilaterally per [facility] .[name]  Per Dr Stephenie Acres note she was encourage to exercise as tolerated.     Getting PT for right shoulder post op  From reverse shoulder   ROM has improved.   Lab Results  Component Value Date   HGBA1C 9.1 (H) 11/19/2017   Lab Results  Component Value Date   MICROALBUR 2.8 (H) 06/04/2017        Outpatient Medications Prior to Visit  Medication Sig Dispense Refill  . acetaminophen (TYLENOL) 650 MG CR tablet Take 650 mg by mouth every 8 (eight) hours as needed for pain.    [name] albuterol (PROVENTIL HFA;VENTOLIN HFA) 108 (90 Base) MCG/ACT inhaler Inhale  2 puffs into the lungs every 8 (eight) hours as needed for wheezing or shortness of breath. (Patient not taking: Reported on 10/31/2017) 1 Inhaler 0  . aspirin EC 81 MG tablet Take 1 tablet (81 mg total) by mouth daily. 90 tablet 3  . butalbital-acetaminophen-caffeine (FIORICET, ESGIC) 50-325-40 MG tablet Take 1 tablet by mouth every 6 (six) hours as needed for headache. (Patient not taking: Reported on 10/31/2017) 60 tablet 0  . Cholecalciferol (VITAMIN D PO) Take 5,000 Units by mouth every other day.     . cyanocobalamin (,VITAMIN B-12,) 1000 MCG/ML injection Inject 1 ml (1000 mcg ) IM weekly x 4,  Then monthly thereafter  (Patient taking differently: Inject 1 ml (1000 mcg ) IM monthly) 110 mL 0  . DULoxetine (CYMBALTA) 30 MG capsule Take 1 capsule (30 mg total) by mouth daily. 180 capsule 1  . Eszopiclone 3 MG TABS Take 1 tablet (3 mg total) by mouth at bedtime. Take immediately before bedtime 90 tablet 0  . levETIRAcetam (KEPPRA) 500 MG tablet Take 1 tablet (500 mg total) by mouth 2 (two) times daily. 180 tablet 1  . magnesium oxide (MAG-OX) 400 MG tablet Take 1 tablet (400 mg total) by mouth 2 (two) times daily. (Patient not taking: Reported on 10/31/2017) 60 tablet 0  . NONFORMULARY OR COMPOUNDED [place]  Combination Pain Cream -  Baclofen 2%, Doxepin 5%, Gabapentin 6%, Topiramate 2%, Pentoxifylline 3% Apply 1-2 grams to affected area 3-4 times daily Qty. 120 gm 3 refills (Patient not taking: Reported on 10/31/2017) 1 each 3  . nystatin (NYAMYC) powder Apply topically 4 (four) times daily.    . ondansetron (ZOFRAN) 4 MG tablet Take 1 tablet (4 mg total) by mouth every 8 (eight) hours as needed for nausea or vomiting. 10 tablet 0  . pregabalin (LYRICA) 100 MG capsule Take 100 mg by mouth daily.    . rosuvastatin (CRESTOR) 10 MG tablet Take 1 tablet (10 mg total) by mouth daily. 90 tablet 3  . Syringe/Needle, Disp, (SYRINGE 3CC/25GX1") 25G X 1" 3 ML MISC Use for b12 injections 50 each 0  . traMADol (ULTRAM) 50 MG tablet Take 0.5 tablets (25 mg total) by mouth every 12 (twelve) hours as needed for severe pain. 30 tablet 0  . diazepam (VALIUM) 5 MG tablet Take 1 tablet (5 mg total) by mouth at bedtime as needed for anxiety. 20 tablet 0   No facility-administered medications prior to visit.     Review of Systems;  Patient denies headache, fevers, malaise, unintentional weight loss, skin rash, eye pain, sinus congestion and sinus pain, sore throat, dysphagia,  hemoptysis , cough, dyspnea, wheezing, chest pain, palpitations, orthopnea, edema, abdominal pain, nausea, melena, diarrhea, constipation,  flank pain, dysuria, hematuria, urinary  Frequency, nocturia, numbness, tingling, seizures,  Focal weakness, Loss of consciousness,  Tremor, insomnia, depression, anxiety, and suicidal ideation.      Objective:  BP 140/88 (BP Location: Left Arm, Patient Position: Sitting, Cuff Size: Normal)   Pulse 81   Temp 98.6 F (37 C) (Oral)   Resp 15   Ht 5\' 4"  (1.626 m)   Wt 259 lb (117.5 kg)   SpO2 94%   BMI 44.46 kg/m   BP Readings from Last 3 Encounters:  11/19/17 140/88  10/31/17 140/90  09/19/17 124/60    Wt Readings from Last 3 Encounters:  11/19/17 259 lb (117.5 kg)  10/31/17 254 lb 6.4 oz (115.4 kg)  09/19/17 250 lb 3.2 oz (113.5 kg)  General appearance: alert, cooperative and appears stated age Ears: normal TM's and external ear canals both ears Throat: lips, mucosa, and tongue normal; teeth and gums normal Neck: no adenopathy, no carotid bruit, supple, symmetrical, trachea midline and thyroid not enlarged, symmetric, no tenderness/mass/nodules Back: symmetric, no curvature. ROM normal. No CVA tenderness. Lungs: clear to auscultation bilaterally Heart: regular rate and rhythm, S1, S2 normal, no murmur, click, rub or gallop Abdomen: soft, non-tender; bowel sounds normal; no masses,  no organomegaly Pulses: 2+ and symmetric Skin: Skin color, texture, turgor normal. No rashes or lesions Lymph nodes: Cervical, supraclavicular, and axillary nodes normal.  Lab Results  Component Value Date   HGBA1C 9.1 (H) 11/19/2017   HGBA1C 7.6 (H) 07/30/2017   HGBA1C 8.1 (H) 06/04/2017    Lab Results  Component Value Date   CREATININE 0.82 11/19/2017   CREATININE 0.87 09/19/2017   CREATININE 0.98 08/21/2017    Lab Results  Component Value Date   WBC 10.3 11/19/2017   HGB 11.9 (L) 11/19/2017   HCT 36.7 11/19/2017   PLT 286.0 11/19/2017   GLUCOSE 242 (H) 11/19/2017   CHOL 183 06/04/2017   TRIG 195.0 (H) 06/04/2017   HDL 48.50 06/04/2017   LDLDIRECT 111.0 06/16/2015    LDLCALC 96 06/04/2017   ALT 21 11/19/2017   AST 23 11/19/2017   NA 139 11/19/2017   K 3.7 11/19/2017   CL 103 11/19/2017   CREATININE 0.82 11/19/2017   BUN 14 11/19/2017   CO2 26 11/19/2017   TSH 2.279 08/20/2017   HGBA1C 9.1 (H) 11/19/2017   MICROALBUR 2.8 (H) 06/04/2017    Dg Shoulder Right  Result Date: 08/20/2017 CLINICAL DATA:  Right shoulder pain.  Fall EXAM: RIGHT SHOULDER - 2+ VIEW COMPARISON:  08/13/2017 FINDINGS: Prior right shoulder replacement. No hardware complicating feature or acute bony abnormality. No fracture, subluxation or dislocation. IMPRESSION: Prior right shoulder replacement.  No acute findings. Electronically Signed   By: Rolm Baptise M.D.   On: 08/20/2017 11:13   Ct Head Wo Contrast  Result Date: 08/19/2017 CLINICAL DATA:  Sudden mental status changes, currently unresponsive. EXAM: CT HEAD WITHOUT CONTRAST TECHNIQUE: Contiguous axial images were obtained from the base of the skull through the vertex without intravenous contrast. COMPARISON:  02/17/2017 FINDINGS: Brain: Age related atrophy. Chronic appearing small vessel ischemic changes the hemispheric white matter. No sign of acute infarction, mass lesion, hemorrhage, hydrocephalus or extra-axial collection. Vascular: There is atherosclerotic calcification of the major vessels at the base of the brain. Skull: Normal Sinuses/Orbits: Clear/normal Other: None IMPRESSION: No acute finding by CT. Age related atrophy and mild chronic small-vessel ischemic change of the white matter. Electronically Signed   By: Nelson Chimes M.D.   On: 08/19/2017 20:41   Ct Angio Chest Pe W Or Wo Contrast  Result Date: 08/20/2017 CLINICAL DATA:  Elevated D-dimer.  Recent shoulder surgery. EXAM: CT ANGIOGRAPHY CHEST WITH CONTRAST TECHNIQUE: Multidetector CT imaging of the chest was performed using the standard protocol during bolus administration of intravenous contrast. Multiplanar CT image reconstructions and MIPs were obtained to evaluate  the vascular anatomy. CONTRAST:  18mL ISOVUE-370 IOPAMIDOL (ISOVUE-370) INJECTION 76% COMPARISON:  Chest radiograph, 08/19/2017.  Chest CTA, 12/07/2015. FINDINGS: Cardiovascular: Satisfactory opacification of the pulmonary arteries to the segmental level. No evidence of pulmonary embolism. Normal heart size. No pericardial effusion. Mild coronary artery calcifications. Great vessels normal in caliber. Aorta is not opacified. No aortic atherosclerotic calcifications. Mediastinum/Nodes: Visualized thyroid is unremarkable. No mediastinal or hilar masses. No pathologically enlarged  lymph nodes. Trachea is widely patent. Esophagus is unremarkable. Lungs/Pleura: There is right lower lobe opacity most consistent with atelectasis. Minimal subsegmental atelectasis noted in the dependent left lower lobe. Lungs otherwise clear. Trace right pleural effusion. No left pleural effusion. No pneumothorax. Upper Abdomen: Changes from prior gastric surgery stable from the prior CT. 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 No acute findings in the visible upper abdomen. Musculoskeletal: There is edema surrounding the new right shoulder prosthesis. No acute fracture. No osteoblastic or osteolytic lesions. Review of the MIP images confirms the above findings. IMPRESSION: 1. No evidence of a pulmonary embolism. 2. Right lower lobe atelectasis and minimal subsegmental left lower lobe atelectasis. No convincing pneumonia. No pulmonary edema. Trace right pleural effusion. Electronically Signed   By: Lajean Manes M.D.   On: 08/20/2017 17:19   Dg Chest Portable 1 View  Result Date: 08/19/2017 CLINICAL DATA:  Recent shoulder surgery. Altered mental status today. EXAM: PORTABLE CHEST 1 VIEW COMPARISON:  06/13/2017. FINDINGS: Poor inspiration. Allowing for that, the heart and mediastinum are normal in the lungs are clear. No collapse or visible infiltrate. Reverse shoulder replacement on the right. IMPRESSION: Poor  inspiration.  No active disease suspected. Electronically Signed   By: Nelson Chimes M.D.   On: 08/19/2017 20:35    Assessment & Plan:   Problem List Items Addressed This Visit    Viral URI with cough    Chest x ray was negative for infiltrate today.  Prednisone taper ,  Cough suppressant,  Decongestant and antihistamine recommended       Obesity, unspecified    Encouraged her to exercise daily using a stationery bike and to follow a low GI diet.  She is s/p gastric bypass remotely with nadir of 169 lbs.  May not tolerate intermittent fasting       Microalbuminuria due to type 2 diabetes mellitus ([place])    Starting telmisartan       Relevant Medications   telmisartan (MICARDIS) 20 MG tablet   Insomnia secondary to anxiety    Given her history of unresponsiveness in February and polpharmacy, I have refused to increase her dose of valium beyond 5 mg.  Patient advised to discuss with neurologist.       Relevant Medications   diazepam (VALIUM) 5 MG tablet   DM type 2 with diabetic peripheral neuropathy ([place]) - Primary    Previously controlled with diet alone but Currently uncontrolled per a1c. She has a history of chronic diarrhea,  So will avoid metformin.  Will recommend Victoza or Trulicity given her morbid obesity . She needs to resume an ARB for management of proteinuria  Lab Results  Component Value Date   HGBA1C 9.1 (H) 11/19/2017   Lab Results  Component Value Date   MICROALBUR 2.8 (H) 06/04/2017         Relevant Medications   diazepam (VALIUM) 5 MG tablet   telmisartan (MICARDIS) 20 MG tablet   Other Relevant Orders   Hemoglobin A1c (Completed)   Comprehensive metabolic panel (Completed)    Other Visit Diagnoses    Viral upper respiratory tract infection       Relevant Orders   CBC with Differential/Platelet (Completed)   DG Chest 2 View (Completed)      I have changed Tia Masker. Sapp's diazepam. I am also having her start on mometasone, predniSONE,  benzonatate, and telmisartan. Additionally, I am having her maintain her albuterol, Cholecalciferol (VITAMIN D PO), butalbital-acetaminophen-caffeine, aspirin  EC, rosuvastatin, ondansetron, DULoxetine, traMADol, Eszopiclone, levETIRAcetam, nystatin, acetaminophen, pregabalin, magnesium oxide, cyanocobalamin, SYRINGE 3CC/25GX1", and NONFORMULARY OR COMPOUNDED ITEM.  Meds ordered this encounter  Medications  . mometasone (ELOCON) 0.1 % ointment    Sig: Apply topically daily. To both ear canals.    Dispense:  15 g    Refill:  0  . predniSONE (DELTASONE) 10 MG tablet    Sig: 6 tablets on Day 1 , then reduce by 1 tablet daily until gone    Dispense:  21 tablet    Refill:  0  . benzonatate (TESSALON) 200 MG capsule    Sig: Take 1 capsule (200 mg total) by mouth 2 (two) times daily as needed for cough.    Dispense:  30 capsule    Refill:  0  . diazepam (VALIUM) 5 MG tablet    Sig: Take 1 tablet (5 mg total) by mouth at bedtime as needed for anxiety. Related to CPAP use    Dispense:  30 tablet    Refill:  0  . telmisartan (MICARDIS) 20 MG tablet    Sig: Take 1 tablet (20 mg total) by mouth daily.    Dispense:  30 tablet    Refill:  3    Medications Discontinued During This Encounter  Medication Reason  . diazepam (VALIUM) 5 MG tablet Reorder    Follow-up: No follow-ups on file.   [name], MD

## 2017-11-19 NOTE — Patient Instructions (Addendum)
You have a viral syndrome which is causing bronchitis.    The post nasal drip may also be contributing to  your  Cough.  I am prescribing a prednisone injection  to manage the inflammation in your bronchial tubes   I also advise use of the following OTC meds to help with your other symptoms.   Continue generic OTC benadryl 25 mg  1 hour before bedtime for the post nasal drip  tessalon perlesfor daytime cough.   You can also flush your sinuses twice daily with Milta Deiters Meds sinus sinse   You cough may last 3 to 4 o 10 weeks   If the chest x ray is abnormal I will call lin an antibiotic days    Please take an OTC  probiotic ( Align, [place] or Culturelle or the store brand  equivalent ) if you start an  antibiotic to prevent a serious antibiotic associated diarrhea  Called clostridium dificile colitis and a vaginal yeast infection.

## 2017-11-20 DIAGNOSIS — M25511 Pain in right shoulder: Secondary | ICD-10-CM | POA: Diagnosis not present

## 2017-11-21 DIAGNOSIS — R809 Proteinuria, unspecified: Secondary | ICD-10-CM | POA: Insufficient documentation

## 2017-11-21 DIAGNOSIS — M6281 Muscle weakness (generalized): Secondary | ICD-10-CM | POA: Diagnosis not present

## 2017-11-21 DIAGNOSIS — E1129 Type 2 diabetes mellitus with other diabetic kidney complication: Secondary | ICD-10-CM | POA: Insufficient documentation

## 2017-11-21 DIAGNOSIS — J069 Acute upper respiratory infection, unspecified: Secondary | ICD-10-CM | POA: Insufficient documentation

## 2017-11-21 DIAGNOSIS — M25511 Pain in right shoulder: Secondary | ICD-10-CM | POA: Diagnosis not present

## 2017-11-21 DIAGNOSIS — B9789 Other viral agents as the cause of diseases classified elsewhere: Secondary | ICD-10-CM

## 2017-11-21 DIAGNOSIS — M25611 Stiffness of right shoulder, not elsewhere classified: Secondary | ICD-10-CM | POA: Diagnosis not present

## 2017-11-21 MED ORDER — TELMISARTAN 20 MG PO TABS: 20.0000 mg | ORAL_TABLET | Freq: Every day | ORAL | 3 refills | Status: DC

## 2017-11-21 NOTE — Assessment & Plan Note (Signed)
Starting telmisartan

## 2017-11-21 NOTE — Assessment & Plan Note (Signed)
Encouraged her to exercise daily using a stationery bike and to follow a low GI diet.  She is s/p gastric bypass remotely with nadir of 169 lbs.  May not tolerate intermittent fasting

## 2017-11-21 NOTE — Assessment & Plan Note (Signed)
Previously controlled with diet alone but Currently uncontrolled per a1c. She has a history of chronic diarrhea,  So will avoid metformin.  Will recommend Victoza or Trulicity given her morbid obesity . She needs to resume an ARB for management of proteinuria  Lab Results  Component Value Date   HGBA1C 9.1 (H) 11/19/2017   Lab Results  Component Value Date   MICROALBUR 2.8 (H) 06/04/2017

## 2017-11-21 NOTE — Assessment & Plan Note (Signed)
Given her history of unresponsiveness in February and polpharmacy, I have refused to increase her dose of valium beyond 5 mg.  Patient advised to discuss with neurologist.

## 2017-11-21 NOTE — Assessment & Plan Note (Signed)
Chest x ray was negative for infiltrate today.  Prednisone taper ,  Cough suppressant,  Decongestant and antihistamine recommended

## 2017-11-25 ENCOUNTER — Ambulatory Visit: Payer: Self-pay | Admitting: *Deleted

## 2017-11-25 NOTE — Telephone Encounter (Signed)
Called in concerned that her glucose is 208 at 12:30 PM today and she has not eaten anything.    She mentioned she took her last prednisone today from a tapered dose pack she was taking.   "I know that runs my sugar up but I didn't know if [name] wanted me back on any of my diabetes medications".    "They took me off of all my medications when I was in the hospital".   "I'm not on any diabetes medications now".    "I was on Januvia for about 10 years".    She has an appt next Monday with an endocrinologist.   She is wanting to know if [name] wants her to do anything in regards to her glucose being 208 of just ride it out until seeing the endocrinologist on Monday.  I routed a note to [name].    Reason for Disposition . Blood glucose 60-240 mg/dl (3.5 -13 mmol/l)  Answer Assessment - Initial Assessment Questions 1. BLOOD GLUCOSE: "What is your blood glucose level?"      208 at 12:30 today without eating anything. 2. ONSET: "When did you check the blood glucose?"     12:30PM today.   I'm drinking a lot of water every day too.  3. USUAL RANGE: "What is your glucose level usually?" (e.g., usual fasting morning value, usual evening value)     Normally runs 120-125 4. KETONES: "Do you check for ketones (urine or blood test strips)?" If yes, ask: "What does the test show now?"      *No Answer* 5. TYPE 1 or 2:  "Do you know what type of diabetes you have?"  (e.g., Type 1, Type 2, Gestational; doesn't know)      Non insulin 6. INSULIN: "Do you take insulin?" If yes, ask: "Have you missed any shots recently?"     No 7. DIABETES PILLS: "Do you take any pills for your diabetes?" If yes, ask: "Have you missed taking any pills recently?"     No  I'm off of everything since in the hospital and my sugars have been fine.   I just took my last pill of prednisone today.  I know it runs my sugar up.  You are wanting to know if [name] wants you to restart any medications or wait until you see the  endocrinologist this coming Monday. 8. OTHER SYMPTOMS: "Do you have any symptoms?" (e.g., fever, frequent urination, difficulty breathing, dizziness, weakness, vomiting)     Woke up with a headache this morning and not feeling well. 9. PREGNANCY: "Is there any chance you are pregnant?" "When was your last menstrual period?"     Not asked due to age  Protocols used: DIABETES - [place]

## 2017-11-25 NOTE — Telephone Encounter (Signed)
Please advise 

## 2017-11-26 ENCOUNTER — Telehealth: Payer: Self-pay

## 2017-11-26 DIAGNOSIS — M25511 Pain in right shoulder: Secondary | ICD-10-CM | POA: Diagnosis not present

## 2017-11-26 DIAGNOSIS — M25611 Stiffness of right shoulder, not elsewhere classified: Secondary | ICD-10-CM | POA: Diagnosis not present

## 2017-11-26 DIAGNOSIS — M6281 Muscle weakness (generalized): Secondary | ICD-10-CM | POA: Diagnosis not present

## 2017-11-26 NOTE — Telephone Encounter (Signed)
Please advise 

## 2017-11-26 NOTE — Telephone Encounter (Signed)
Patient notified that [name] says there is nothing to do, her blood sugars will come down once the prednisone is out her system. Patient is aware of lab results and states that she has an appt with Deirdre Pippins on Monday. Patient verbalized understanding.

## 2017-11-26 NOTE — Telephone Encounter (Signed)
Patient was triaged yesterday for elevated blood sugar she took her last dose of prednisone on yesterday and is aware that it can elevate her blood sugars. Her blood sugar was 225 when she woke up and is not sure what to do.

## 2017-11-26 NOTE — Telephone Encounter (Signed)
See prior response to Patina

## 2017-11-26 NOTE — Telephone Encounter (Signed)
Nothing to do as it will come down once the prednisone is out of her system.  Has she read the myhcart message from last week's labs,  If not please read it to her.

## 2017-11-28 DIAGNOSIS — M25611 Stiffness of right shoulder, not elsewhere classified: Secondary | ICD-10-CM | POA: Diagnosis not present

## 2017-11-28 DIAGNOSIS — M25511 Pain in right shoulder: Secondary | ICD-10-CM | POA: Diagnosis not present

## 2017-11-28 DIAGNOSIS — M6281 Muscle weakness (generalized): Secondary | ICD-10-CM | POA: Diagnosis not present

## 2017-12-02 ENCOUNTER — Ambulatory Visit (INDEPENDENT_AMBULATORY_CARE_PROVIDER_SITE_OTHER): Payer: Medicare Other | Admitting: Pharmacist

## 2017-12-02 ENCOUNTER — Encounter: Payer: Self-pay | Admitting: Pharmacist

## 2017-12-02 VITALS — BP 112/72 | Wt 254.0 lb

## 2017-12-02 DIAGNOSIS — E1142 Type 2 diabetes mellitus with diabetic polyneuropathy: Secondary | ICD-10-CM

## 2017-12-02 MED ORDER — DULAGLUTIDE 0.75 MG/0.5ML ~~LOC~~ SOAJ: 0.7500 mg | SUBCUTANEOUS | 3 refills | Status: DC

## 2017-12-02 NOTE — Patient Instructions (Addendum)
Thanks for coming to see [place], we will start to work on this but it may take a few visits to get on top of it.   1. Start taking Januvia 100 mg once a day and use glipizide WITH A MEAL if your blood sugar is higher than 160.  2. I sent an order for Trulicity 4.73 mg once a week. When you get this in, come to the office on a Monday and we'll show you how to use it. When you get Trulicity we will stop the Januvia.  3. Pick up the Telmisartan and take 1 tablet daily.   Come back to see me as soon as you get that Trulicity. I will call and check on you next week.

## 2017-12-02 NOTE — Progress Notes (Addendum)
S:     Chief Complaint  Patient presents with  . Medication Management    Diabetes     Patient arrives with her husband, ambulating precariously, but without assistance.  Presents for diabetes evaluation, education, and management at the request of [name] (referred on 11/21/17 - lab note). Last seen by primary care provider on 11/19/17 - at that time telmisartan was started for proteinuria, A1C was found to be elevated at 9.1% and patient was referred to Rx Clinic.   Patient states was unaware that she was going to be prescribed telmisartan, as she insists her blood pressure has been controlled for some time. Patient reports extreme displeasure with multiple sites of care that she has visited in the last ~10 months (multiple fractures in feet, shoulder surgery, reported unintentional drug overdose due to med error at rehab facility). Reports she was taken off of all meds at a second rehab facility, including januvia daily and glipizide PRN for CBGs >160 as she experienced severe hypoglycemia. Patient had been bedridden for many months following foot and shoulder surgeries last year. Patient indicates history of multiple gastric bypass surgeries.   Reports she could tell that her CBGs were elevated (250s on check) last week while on prednisone for PNA, finished prednisone 1 week ago, fasting this morning was 255  Patient reports Diabetes was diagnosed in the 1990s.   Family/Social History: Worked as an Therapist, sports x 40 years   Human resources officer affordability: [facility] (gets all meds free here), [employer]BS  Current diabetes medications include: none at present Current hypertension medications include: none at present  Patient denies hypoglycemic events.  Patient reported dietary habits: Eats 3 meals/day Breakfast: none Lunch: (12 noon) BLT sandwich with cheese and mayonnaise  Dinner: hardees fried chicken, creamed potatoes, green beans, biscuit Snacks: fat free ice cream cone, apple  pie Drinks:crystal light + water, diet tea  Patient reported exercise habits: unable to exercise due to surgeries to shoulder and right foot.   Patient reports neuropathy - she is unable to feel either of her feet.  Patient denies visual changes.  Patient reports foot exams - she sees a podiatrist for her toenails every 3 months.   O:  Physical Exam  Constitutional: She appears well-developed and well-nourished.     Review of Systems  All other systems reviewed and are negative.    Lab Results  Component Value Date   HGBA1C 9.1 (H) 11/19/2017   Vitals:   12/02/17 1514  BP: 112/72    Lipid Panel     Component Value Date/Time   CHOL 183 06/04/2017 1412   CHOL 155 02/27/2014 0420   TRIG 195.0 (H) 06/04/2017 1412   TRIG 121 02/27/2014 0420   HDL 48.50 06/04/2017 1412   HDL 43 02/27/2014 0420   CHOLHDL 4 06/04/2017 1412   VLDL 39.0 06/04/2017 1412   VLDL 24 02/27/2014 0420   LDLCALC 96 06/04/2017 1412   LDLCALC 88 02/27/2014 0420   LDLDIRECT 111.0 06/16/2015 0936    Home fasting CBG: 250s  Clinical ASCVD: No ; Major ASCVD events: none; High-risk conditions: Age 80 or older, DM, HTN  ASCVD risk factors: age 71-75 10 year ASCVD risk: 22.2%  A/P: #Diabetes longstanding currently uncontrolled with elevated A1c and CBG readings on no medication. Patient denies hypoglycemic events and is able to verbalize appropriate hypoglycemia management plan. Control is suboptimal due to dietary indiscretion, lack of physical activity, suboptimal pharmacotherapy. - Re-initiated Januvia 100 mg daily to continue until  Trulicity arrives - Re-initiated glipizide 5 mg, with a meal, if CBG >194 - Initiated Trulicity 1.74 mg injected once weekly. Sent prescription to [facility], counseled patient to return when she obtains the medication to be shown how to use the pen. Patient educated on purpose, proper use and potential adverse effects of Trulicity.  Following instruction patient verbalized  understanding of treatment plan.  - Next A1C anticipated 02/19/18 or later.    #ASCVD risk - primary prevention in patient aged 26-75 with DM, baseline LDL 70-189, high intensity statin and aspirin for primary prevention indicated.  - Patient not taking aspirin or statin at this time, consider re-initiating both at follow up visit  #Hypertension longstanding currently controlled, as evidened by in office blood pressure. Patient is not taking any medication for management of hypertension. - Encouraged adherence to Telmisartan 20 mg daily for proteinuria as prescribed by [name] previously. Patient agreeable.   Written patient instructions provided.  Total time in face to face counseling 70 minutes.    Follow up in Pharmacist Clinic Visit as soon as patient receives Trulicity from [facility].   Patient seen with Cleotis Lema, PharmD Candidate.    Carlean Jews, Pharm.D., BCPS, CPP PGY2 Ambulatory Care Pharmacy Resident Phone: [phone]    I have reviewed the above information and agree with above.   Deborra Medina, MD

## 2017-12-04 NOTE — Assessment & Plan Note (Signed)
#  Diabetes longstanding currently uncontrolled with elevated A1c and CBG readings on no medication. Patient denies hypoglycemic events and is able to verbalize appropriate hypoglycemia management plan. Control is suboptimal due to dietary indiscretion, lack of physical activity, suboptimal pharmacotherapy. - Re-initiated Januvia 100 mg daily to continue until Trulicity arrives - Re-initiated glipizide 5 mg, with a meal, if CBG >277 - Initiated Trulicity 8.24 mg injected once weekly. Sent prescription to [facility], counseled patient to return when she obtains the medication to be shown how to use the pen. Patient educated on purpose, proper use and potential adverse effects of Trulicity.  Following instruction patient verbalized understanding of treatment plan.  - Next A1C anticipated 02/19/18 or later.

## 2017-12-04 NOTE — Assessment & Plan Note (Signed)
#  ASCVD risk - primary prevention in patient aged 72-75 with DM, baseline LDL 70-189, high intensity statin and aspirin for primary prevention indicated.  - Patient not taking aspirin or statin at this time, consider re-initiating both at follow up visit  #Hypertension longstanding currently controlled, as evidened by in office blood pressure. Patient is not taking any medication for management of hypertension. - Encouraged adherence to Telmisartan 20 mg daily for proteinuria as prescribed by [name] previously. Patient agreeable.

## 2017-12-05 DIAGNOSIS — M25511 Pain in right shoulder: Secondary | ICD-10-CM | POA: Diagnosis not present

## 2017-12-05 DIAGNOSIS — M25611 Stiffness of right shoulder, not elsewhere classified: Secondary | ICD-10-CM | POA: Diagnosis not present

## 2017-12-05 DIAGNOSIS — M6281 Muscle weakness (generalized): Secondary | ICD-10-CM | POA: Diagnosis not present

## 2017-12-06 ENCOUNTER — Ambulatory Visit: Payer: Self-pay

## 2017-12-06 NOTE — Telephone Encounter (Signed)
Spoke with pt and informed her of Dr. Lupita Dawn message below. Informed her that she needs to stop the telmisartan and call us on Tuesday with some bp readings. In the mean time if her bp is to get above 160/90 then she will need to call the on call doctor. Pt gave a verbal understanding.

## 2017-12-06 NOTE — Telephone Encounter (Signed)
Pt calling to report 30 minutes after taking telmisartan: lethargic headache dizzy, can tell BP is dropping, nauseated, pt stated that when her BP is down she is "wiped out" and feels chest pain to left upper breast to mid sternum and very fatigued.  Her BP this am was 98/53,  BP recheck during call was 129/72-  Best contact: [phone]- Pt wanting to know if she needs to stop or continue to take med.

## 2017-12-06 NOTE — Telephone Encounter (Signed)
Pt calling to report 30 minutes after taking telmisartan: lethargic headache dizzy, can tell BP is dropping, nauseated, pt stated that when her BP is down she is "wiped out" and feels chest pain to left upper breast to mid sternum and very fatigued.  Her BP this am was 98/53,  BP recheck during call was 129/72-  Best contact: [phone]- Pt wanting to know if she needs to stop or continue to take med. Reason for Disposition . Caller has URGENT medication question about med that PCP prescribed and triager unable to answer question  Answer Assessment - Initial Assessment Questions 1. SYMPTOMS: "Do you have any symptoms?"     30 minutes after taking telmisartan: lethargic headache dizzy, can tell BP is dropping, nauseated, pt stated that when her BP is down she is "wiped out" and feels chest pain to left upper breast to mid sternum and very fatigued.  Her BP this am was 98/53, -  Best contact: [phone] 2. SEVERITY: If symptoms are present, ask "Are they mild, moderate or severe?" Moderate to severe.  Answer Assessment - Initial Assessment Questions 1. LOCATION: "Where does it hurt?"       Mid sternum 2. RADIATION: "Does the pain go anywhere else?" (e.g., into neck, jaw, arms, back)     Pain in right jaw- left arm hurts at the top of the shoulder above breast down to mid sternum 3. ONSET: "When did the chest pain begin?" (Minutes, hours or days)      Tuesday 4. PATTERN "Does the pain come and go, or has it been constant since it started?"  "Does it get worse with exertion?"      Comes and goes 5. DURATION: "How long does it last" (e.g., seconds, minutes, hours)     starts 30 minutes affter taking med lasts for 4 hours  6. SEVERITY: "How bad is the pain?"  (e.g., Scale 1-10; mild, moderate, or severe)    - MILD (1-3): doesn't interfere with normal activities     - MODERATE (4-7): interferes with normal activities or awakens from sleep    - SEVERE (8-10): excruciating pain, unable to do any  normal activities       moderate 7. CARDIAC RISK FACTORS: "Do you have any history of heart problems or risk factors for heart disease?" (e.g., prior heart attack, angina; high blood pressure, diabetes, being overweight, high cholesterol, smoking, or strong family history of heart disease)     Has h/o HTN but is running low BP's, diabetes, being overweight , high cholesterol, Motherand Father with heart diseaase  8. PULMONARY RISK FACTORS: "Do you have any history of lung disease?"  (e.g., blood clots in lung, asthma, emphysema, birth control pills)     COPD  9. CAUSE: "What do you think is causing the chest pain?"     telmisartan 10. OTHER SYMPTOMS: "Do you have any other symptoms?" (e.g., dizziness, nausea, vomiting, sweating, fever, difficulty breathing, cough)       SOB, dizziness, nausea (but is better sfter the med is out of her system) 11. PREGNANCY: "Is there any chance you are pregnant?" "When was your last menstrual period?"       n/a  Protocols used: MEDICATION QUESTION CALL-A-AH, CHEST PAIN-A-AH

## 2017-12-06 NOTE — Telephone Encounter (Signed)
STOP THE TELMISARTAN . CALL us ON Tuesday WITH BP READINGS.  IF THEY BECOME TOO ELEVATED OVER THE WEEKEND. (> 160/90) , CALL THE DOCTOR ON CALL

## 2017-12-10 ENCOUNTER — Telehealth: Payer: Self-pay

## 2017-12-10 ENCOUNTER — Telehealth: Payer: Self-pay | Admitting: Pharmacist

## 2017-12-10 DIAGNOSIS — M6281 Muscle weakness (generalized): Secondary | ICD-10-CM | POA: Diagnosis not present

## 2017-12-10 DIAGNOSIS — M25611 Stiffness of right shoulder, not elsewhere classified: Secondary | ICD-10-CM | POA: Diagnosis not present

## 2017-12-10 DIAGNOSIS — M25511 Pain in right shoulder: Secondary | ICD-10-CM | POA: Diagnosis not present

## 2017-12-10 NOTE — Telephone Encounter (Signed)
Pt called this morning very upset because her blood sugars are running high. She stated that the highest they have been is 280. She took her blood sugar this morning and it was 208 and that is without eating anything or taking her medication. The pt stated that she saw Deirdre Pippins on 12/02/2017 and was prescribed Trulicity but was told to continue the Januvia and Glipizide until she saw Chrys Racer again because they thought it would take 3 weeks for the insulin to come in. Pt stated that she called Caroline's personal cell phone number on Thursday and informed her that her blood sugars were running high and that she had already received the Trulicity could she go ahead and start using it. The pt stated that Chrys Racer told her no that she would need to wait until she saw her again. Pt was under the impression that Chrys Racer would be in the office today or tomorrow around lunch time to see one pt and she could stop by then to be shown how to use the Trulicity but looking at Caroline's schedule there is no one schedule to come in. The pt is wanting to know if you would give her the permission to go ahead and start the Trulicity and her come in today to have a teaching. Pt was advised that you will not be in the office until this afternoon. Pt was upset about that because she has not had any of her medication and her blood sugar it already 208. Pt was advised to go ahead and take her Januvia and Glipizide as prescribed and we would give her a call back this afternoon. Pt stated to give her a call on her cell phone.   Pt also wanted to let you know that her blood pressure has been as low as 90/49 and as high as 149/98.  Pt stated that she would not take orders from a pharmacist ever again. She stated that she was under the impression that she was seeing a NP in Endocrinology.

## 2017-12-10 NOTE — Telephone Encounter (Signed)
Spoke with pt and she stated that she is coming in tomorrow at 11:30 to see Deirdre Pippins.

## 2017-12-10 NOTE — Telephone Encounter (Signed)
Patient called me on 5/23 at 5:36 PM on my work cell number as I was on my way home. I unfortunately was unable to write a note in her chart as I was out of the office from 5/24-5/27 and I had left my. At the time of her call on 5/23, she explained that Trulicity had come in and requested advice on how to handle her medications. I explained that once Trulicity started, she would dc Januvia. She was to continue glipizide with a meal for CBGs >160. I offered to meet patient in [place] at [place] on Wednesday around 1130 AM to teach her how to use the device. We also discussed her going to a local pharmacy for teaching however she stated that she preferred to come into the office for teaching. She also expressed continued concern with her BP medication, stating that it made her feel ill. I asked her to keep a log of BPs and not take the medication if her diastolic pressures were <93 mmHg or if she was symptomatic.   ---------------- 3:58 PM  12/10/2017  Called patient back in response to communication left on my voicemail yesterday that her CBGs were elevated and she was concerned about what to do. Patient was obviously upset over several things including that she was referred to a pharmacist for management of a chronic disease state, stating that she was only willing to see an NP or a "real doctor". She expressed anger that her CBGs were elevated all weekend and that the staff at [facility] did not know about me coming in on Wednesday to teach her how to use Trulicity. She reports she has stopped her BP med, stating that her BP was 90/30 at one point over the weekend. Patient was under the impression I told her to stop her BP med and only take Januvia when CBGs were >160, however she backtracked several times during the conversation indicating some confusion with instructions.    I apologized multiple times for the lapse in communication and misunderstandings. I explained the plan to stop Januvia and start  Trulicity, continue glipizide for CBGs >160. The patient unfortunately hung up abruptly however she ultimately requested to drop in on Wednesday for teaching with the Trulicity with me.   Will follow up with patient on Wednesday ~11:30 am  Carlean Jews, Pharm.D., BCPS PGY2 Ambulatory Care Pharmacy Resident Phone: [phone]

## 2017-12-10 NOTE — Telephone Encounter (Signed)
If she has been taking [place] and glipizide for at least 2 weeks,  She can ADD the trulicity if someone at the office and show her how to take. It

## 2017-12-11 ENCOUNTER — Telehealth: Payer: Self-pay | Admitting: Pharmacist

## 2017-12-11 MED ORDER — BREEZE 2 BLOOD GLUCOSE SYSTEM DEVI: 1.0000 | Freq: Once | 0 refills | Status: AC

## 2017-12-11 MED ORDER — GLUCOSE BLOOD VI DISK: DISK | 11 refills | Status: DC

## 2017-12-11 NOTE — Telephone Encounter (Addendum)
Met patient today at her doctor's office to teach her how to use Trulicity Pen. Patient agrees to continued follow up with myself for T2DM pursuant to referral from [name]. Patient brings in CBG log and BP cuff with BP log. Reports she has been off of telmisartan since 5/22 as she had concerning low blood pressures after 1 dose.   CBG log revealed CBGs in mid-to-high 100s to low 200s. BPs ranged from 90s-140s/40s-70s (off telmisartan).   Measured her BP on her home monitor - 125/69 mmHg Compared to BP measured on in-office machine - 115/77 mmHg  A/P  #Diabetes - patient with uncontrolled CBGs on Januvia and PRN glipizide. Started Trulicity 9.97 mg weekly, patient administered first dose appropriately in office. Patient educated on purpose, proper use and potential adverse effects of n/v.  Stopped Januvia and continued PRN glipizide for CBGs >160 with a meal.  Following instruction patient verbalized understanding of treatment plan. Patient requests updated Rx for Breeze2 meter and strips. Refill provided as hardcopy. Provided CBG log and asked her to check BID.    #HTN - No changes made to medications and will defer to management by [name]. Of note, home BP cuff inaccurate with >10 mmHg difference in both SBP and DBP.   Patient agrees to follow up with me on Monday with CBGs.   Carlean Jews, Pharm.D., BCPS PGY2 Ambulatory Care Pharmacy Resident Phone: [phone]

## 2017-12-12 DIAGNOSIS — M6281 Muscle weakness (generalized): Secondary | ICD-10-CM | POA: Diagnosis not present

## 2017-12-12 DIAGNOSIS — M25511 Pain in right shoulder: Secondary | ICD-10-CM | POA: Diagnosis not present

## 2017-12-12 DIAGNOSIS — M25611 Stiffness of right shoulder, not elsewhere classified: Secondary | ICD-10-CM | POA: Diagnosis not present

## 2017-12-12 NOTE — Telephone Encounter (Signed)
  I have reviewed the above information and agree with above.   Tremar Wickens, MD 

## 2017-12-16 ENCOUNTER — Encounter: Payer: Self-pay | Admitting: Pharmacist

## 2017-12-16 ENCOUNTER — Ambulatory Visit (INDEPENDENT_AMBULATORY_CARE_PROVIDER_SITE_OTHER): Payer: Medicare Other | Admitting: Pharmacist

## 2017-12-16 VITALS — BP 125/80 | HR 90

## 2017-12-16 DIAGNOSIS — E1142 Type 2 diabetes mellitus with diabetic polyneuropathy: Secondary | ICD-10-CM

## 2017-12-16 MED ORDER — ONETOUCH DELICA LANCETS FINE MISC: 3 refills | Status: DC

## 2017-12-16 MED ORDER — ONETOUCH DELICA LANCING DEV MISC: 0 refills | Status: DC

## 2017-12-16 MED ORDER — ONETOUCH VERIO W/DEVICE KIT: 1.0000 | PACK | Freq: Every day | 0 refills | Status: DC

## 2017-12-16 MED ORDER — GLUCOSE BLOOD VI STRP: ORAL_STRIP | 12 refills | Status: DC

## 2017-12-16 NOTE — Progress Notes (Signed)
S:     Chief Complaint  Patient presents with  . Medication Management    Diabetes    Patient arrives in [place], ambulating without assistance, accompanied by husband .  Presents for diabetes evaluation, education, and management at the request of [name] (referred on 11/21/17 - lab note). Last seen by primary care provider on 11/19/17. Last Rx Clinic visit on 12/11/2017 - at that time medication regimen was clarified, patient was started on Trulicty 1.69 mg, Januvia was stopped, telmisartan was held per patient preference, Breeze2 meter supplies were prescribed as meter was found to be old.  Patient presents today, reporting her CBGs have improved however she has been "sick as a dog" between longstanding problem of frequent bowel movements and nausea. Patient reports bowel movements have been the frequent since she was 72 years old. Nausea is not always present but she finds it tolerable. Reports she took zofran x1 since last visit. Per patient, this "always happens" with medication changes and she requests that she stays on Trulicity for one month in total before determining that it is intolerable. Reports she tried to pick up the [place] however was told that it is no longer available on the market. Requests new meter and supplies that Medicare will cover.   Patient reports adherence with medications.  Current diabetes medications include: Trulicity 6.78 mg once weekly Current hypertension medications include: none  Patient denies hypoglycemic events.  Patient reported dietary habits: Eats 1-2 meals/day Breakfast: none Lunch:stewed beef over rice, beets, mac and cheese Dinner:potato soup  Patient reported exercise habits:  none   Patient reports nocturia.  Patient denies pain/burning on urination.  Patient denies visual changes. Patient reports self foot exams.   O:  Physical Exam  Constitutional: She appears well-developed and well-nourished.     Review of  Systems  Gastrointestinal: Positive for diarrhea and nausea. Negative for vomiting.  All other systems reviewed and are negative.    Lab Results  Component Value Date   HGBA1C 9.1 (H) 11/19/2017   Vitals:   12/16/17 1436  BP: 125/80  Pulse: 90    Lipid Panel     Component Value Date/Time   CHOL 183 06/04/2017 1412   CHOL 155 02/27/2014 0420   TRIG 195.0 (H) 06/04/2017 1412   TRIG 121 02/27/2014 0420   HDL 48.50 06/04/2017 1412   HDL 43 02/27/2014 0420   CHOLHDL 4 06/04/2017 1412   VLDL 39.0 06/04/2017 1412   VLDL 24 02/27/2014 0420   LDLCALC 96 06/04/2017 1412   LDLCALC 88 02/27/2014 0420   LDLDIRECT 111.0 06/16/2015 0936   Post-prandial = 209s [place] = 140s-170s  Clinical ASCVD: No ; High-risk conditions: Age 47 or older, DM, HTN  ASCVD risk factors: age 72-75 10 year ASCVD risk: 22.2%  A/P: #Diabetes longstanding currently uncontrolled with elevated A1c however improved as evidenced by CBG readings on Trulicity 9.38 mg weekly. Unclear if patient is tolerating Trulicity as her GI sx is complicated by concomitant irritable bowel syndrome. Patient denies hypoglycemic events and is able to verbalize appropriate hypoglycemia management plan. Control is suboptimal due to dietary indiscretion, lack of physical activity, suboptimal pharmacotherapy. - Discussed risks vs benefits of continuing Trulicity. Nausea is often mitigated with repeat dosing and patient requests that she stay on it for another 3 doses. Discussed return precautions and encouraged hydration.  - Continued glipizide with a meal PRN for CBGs > 150 - Sent CBG testing supplies for OneTouch meter system. - Next  A1C anticipated 02/19/18 or later.    #ASCVD risk - primary prevention in patient aged 7-75 with DM, baseline LDL 70-189, high intensity statin and aspirin for primary prevention indicated.  - Patient not taking aspirin or statin at this time, consider re-initiating both at follow up  visit  #Hypertension longstanding currently controlled, as evidened by in office blood pressure. Patient is not taking any medication for management of hypertension. - Will defer to Dr. Lupita Dawn management. Encouraged follow up with [name].    Written patient instructions provided.  Total time in face to face counseling 60 minutes.    Follow up in Pharmacist Clinic Visit 2-3 weeks. Carlean Jews, Pharm.D., BCPS, CPP PGY2 Ambulatory Care Pharmacy Resident Phone: [phone]

## 2017-12-16 NOTE — Patient Instructions (Addendum)
Good to see you. Continue the Trulicity as long as the nausea is tolerable and not getting any worse. This should improve after 2-3 doses. I sent prescriptions for the OneTouch meter and testing supplies. Come back to see me in 2-3 weeks. Call if the nausea is intolerable.

## 2017-12-17 DIAGNOSIS — M25611 Stiffness of right shoulder, not elsewhere classified: Secondary | ICD-10-CM | POA: Diagnosis not present

## 2017-12-17 DIAGNOSIS — M6281 Muscle weakness (generalized): Secondary | ICD-10-CM | POA: Diagnosis not present

## 2017-12-17 DIAGNOSIS — M25511 Pain in right shoulder: Secondary | ICD-10-CM | POA: Diagnosis not present

## 2017-12-18 NOTE — Assessment & Plan Note (Signed)
#  Diabetes longstanding currently uncontrolled with elevated A1c however improved as evidenced by CBG readings on Trulicity 7.34 mg weekly. Unclear if patient is tolerating Trulicity as her GI sx is complicated by concomitant irritable bowel syndrome. Patient denies hypoglycemic events and is able to verbalize appropriate hypoglycemia management plan. Control is suboptimal due to dietary indiscretion, lack of physical activity, suboptimal pharmacotherapy. - Discussed risks vs benefits of continuing Trulicity. Nausea is often mitigated with repeat dosing and patient requests that she stay on it for another 3 doses. Discussed return precautions and encouraged hydration.  - Continued glipizide with a meal PRN for CBGs > 150 - Sent CBG testing supplies for OneTouch meter system. - Next A1C anticipated 02/19/18 or later.

## 2017-12-18 NOTE — Assessment & Plan Note (Signed)
#  ASCVD risk - primary prevention in patient aged 72-75 with DM, baseline LDL 70-189, high intensity statin and aspirin for primary prevention indicated.  - Patient not taking aspirin or statin at this time, consider re-initiating both at follow up visit  #Hypertension longstanding currently controlled, as evidened by in office blood pressure. Patient is not taking any medication for management of hypertension. - Will defer to Dr. Lupita Dawn management. Encouraged follow up with [name].

## 2017-12-19 NOTE — Progress Notes (Signed)
  I have reviewed the above information and agree with above.   Kiefer Opheim, MD 

## 2017-12-23 ENCOUNTER — Ambulatory Visit (INDEPENDENT_AMBULATORY_CARE_PROVIDER_SITE_OTHER): Payer: Medicare Other

## 2017-12-23 VITALS — BP 118/78 | HR 87 | Temp 98.2°F | Resp 15 | Ht 63.0 in | Wt 249.0 lb

## 2017-12-23 DIAGNOSIS — Z0000 Encounter for general adult medical examination without abnormal findings: Secondary | ICD-10-CM

## 2017-12-23 NOTE — Patient Instructions (Addendum)
  Denise Sharp , Thank you for taking time to come for your Medicare Wellness Visit. I appreciate your ongoing commitment to your health goals. Please review the following plan we discussed and let me know if I can assist you in the future.   Follow up as needed.    Have a great day!  These are the goals we discussed: Goals    . DIET - INCREASE WATER INTAKE     Stay hydrated    . INCREASE LEAN PROTEINS     Fresh vegetables and fruits Low carb diet      . Increase physical activity     Stay active. Walk as tolerated.  Chair exercises.        This is a list of the screening recommended for you and due dates:  Health Maintenance  Topic Date Due  . Pneumonia vaccines (2 of 2 - PPSV23) 01/20/2015  . Eye exam for diabetics  10/15/2017  . Complete foot exam   11/05/2017  . Flu Shot  02/13/2018  . Hemoglobin A1C  05/22/2018  . Urine Protein Check  06/04/2018  . Mammogram  12/06/2018  . Colon Cancer Screening  01/07/2024  . Tetanus Vaccine  04/18/2026  . DEXA scan (bone density measurement)  Completed  .  Hepatitis C: One time screening is recommended by Center for Disease Control  (CDC) for  adults born from [birth year] through 1965.   Completed

## 2017-12-23 NOTE — Progress Notes (Addendum)
Subjective:   Denise Sharp is a 72 y.o. female who presents for Medicare Annual (Subsequent) preventive examination.  Review of Systems:  No ROS.  Medicare Wellness Visit. Additional risk factors are reflected in the social history.  Cardiac Risk Factors include: advanced age (>5mn, >>57women);obesity (BMI >30kg/m2);diabetes mellitus;hypertension     Objective:     Vitals: BP 118/78 (BP Location: Left Arm, Patient Position: Sitting, Cuff Size: Normal)   Pulse 87   Temp 98.2 F (36.8 C) (Oral)   Resp 15   Ht '5\' 3"'  (1.6 m)   Wt 249 lb (112.9 kg)   SpO2 95%   BMI 44.11 kg/m   Body mass index is 44.11 kg/m.  Advanced Directives 12/23/2017 08/20/2017 08/19/2017 08/14/2017 07/30/2017 02/17/2017 01/31/2017  Does Patient Have a Medical Advance Directive? Yes No No Yes Yes Yes Yes  Type of AParamedicof ASpring Lake HeightsLiving will HWarren AFBLiving will - HFrontierLiving will HLos PradosLiving will HDrakes BranchLiving will Living will;Healthcare Power of Attorney  Does patient want to make changes to medical advance directive? No - Patient declined No - Patient declined - No - Patient declined - No - Patient declined -  Copy of [place]onin Chart? Yes Yes - Yes Yes - -  Would patient like information on creating a medical advance directive? - No - Patient declined - - - - -    Tobacco Social History   Tobacco Use  Smoking Status Never Smoker  Smokeless Tobacco Never Used     Counseling given: Not Answered   Clinical Intake:  Pre-visit preparation completed: Yes  Pain : No/denies pain     Nutritional Status: BMI > 30  Obese Diabetes: Yes(Followed by pcp)  How often do you need to have someone help you when you read instructions, pamphlets, or other written materials from your doctor or pharmacy?: 1 - Never  Interpreter Needed?: No     Past Medical History:    Diagnosis Date  . Abnormality of gait 03/25/2013  . Adrenal mass, left ([place]   . Anemia    iron deficient post  2 unit txfsn 2009, [facility] . Arthritis   . Atypical chest pain    a. 04/1986 Cath ([facility]): nl cors, EF 65%; b. 07/2012 Cath ([facility]: LM nl, LAD/LCX/RCA w/ min irregs; c. 07/2016 MV ([facility]: "Equivocal" w/ small inf wall rev defect and hyperdynamic LV contraction, EF 87%; d. 08/2016 Cardiac CT Ca2+ score ([facility]: Ca2+ score 1548.  .Marland KitchenCervical spinal stenosis 1994   due to trauma to back ([employer] accident), has intermittent paralysis and parasthesias  . Cervicogenic headache 03/23/2014  . Depression   . Dizziness    chronic dizziness  . DJD (degenerative joint disease)    a. Chronic R shoulder pain pending R shoulder replacement 07/2017.  .Marland KitchenEsophageal stenosis March 2011   with transietn outlet obstruction by food, cleared by EGD   . Family history of adverse reaction to anesthesia    daughter PONV  . Gastric bypass status for obesity   . Headache(784.0)   . Hypertension   . IBS (irritable bowel syndrome)   . Left bundle branch block (LBBB)    a. Intermittently present - likely rate related. - patient denies  . Obesity   . Obstructive sleep apnea    using CPAP  . 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 Restless leg syndrome   . Rotator cuff  arthropathy, right 08/13/2017  . Syncope and collapse 03/12/2014  . Type II diabetes mellitus ([employer])    Past Surgical History:  Procedure Laterality Date  . ABDOMINAL HYSTERECTOMY    . APPENDECTOMY    . COLONOSCOPY    . DIAPHRAGMATIC HERNIA REPAIR  2015  . ESOPHAGEAL DILATION     multiple  . ESOPHAGOGASTRODUODENOSCOPY    . [place]  . GALLBLADDER SURGERY  resection  . GASTRIC BYPASS    . GASTRIC BYPASS  2000, 2005   Dr. Debroah Loop  . [facility] IMPLANT PLACEMENT  April 2013   [place]  . JOINT REPLACEMENT  2007   bilateral knee. [name],  [name]  . REVERSE SHOULDER ARTHROPLASTY Right  08/13/2017   Procedure: REVERSE RIGHT SHOULDER ARTHROPLASTY;  Surgeon: [name], MD;  Location: [facility];  Service: Orthopedics;  Laterality: Right;  . ROTATOR CUFF REPAIR     right  . [place]  . TOTAL ABDOMINAL HYSTERECTOMY W/ BILATERAL SALPINGOOPHORECTOMY  1974  . UMBILICAL HERNIA REPAIR  Aug 11, 2015   Family History  Problem Relation Age of Onset  . Heart disease Father   . Hypertension Father   . Prostate cancer Father   . Stroke Father   . Osteoporosis Father   . Stroke Mother   . Depression Mother   . Headache Mother   . Heart disease Mother   . Thyroid disease Mother   . Hypertension Mother   . Diabetes Daughter   . Heart disease Daughter   . Hypertension Daughter   . Hypertension Son    Social History   Socioeconomic History  . Marital status: Married    Spouse name: [name]   . Number of children: 2  . Years of education: College   . Highest education level: Not on file  Occupational History    Employer: retired  Scientific laboratory technician  . Financial resource strain: Not hard at all  . Food insecurity:    Worry: Never true    Inability: Never true  . Transportation needs:    Medical: No    Non-medical: No  Tobacco Use  . Smoking status: Never Smoker  . Smokeless tobacco: Never Used  Substance and Sexual Activity  . Alcohol use: No  . Drug use: No  . Sexual activity: Not Currently  Lifestyle  . Physical activity:    Days per week: 0 days    Minutes per session: Not on file  . Stress: Not on file  Relationships  . Social connections:    Talks on phone: Not on file    Gets together: Not on file    Attends religious service: Not on file    Active member of club or organization: Not on file    Attends meetings of clubs or organizations: Not on file    Relationship status: Not on file  Other Topics Concern  . Not on file  Social History Narrative   Patient lives at home with husband [name].    Patient has 2 children.    Patient has  [employer].    Patient is on disability.    Patient is right handed.     Outpatient Encounter Medications as of 12/23/2017  Medication Sig  . albuterol (PROVENTIL HFA;VENTOLIN HFA) 108 (90 Base) MCG/ACT inhaler Inhale 2 puffs into the lungs every 8 (eight) hours as needed for wheezing or shortness of breath.  [name] aspirin EC 81 MG tablet Take 1 tablet (81 mg total)  by mouth daily.  . benzonatate (TESSALON) 200 MG capsule Take 1 capsule (200 mg total) by mouth 2 (two) times daily as needed for cough.  . Blood Glucose Monitoring Suppl (ONETOUCH VERIO) w/Device KIT 1 Device by Does not apply route daily. Use to test blood glucose once daily. Dx code = E11.9  . butalbital-acetaminophen-caffeine (FIORICET, ESGIC) 50-325-40 MG tablet Take 1 tablet by mouth every 6 (six) hours as needed for headache.  . cyanocobalamin (,VITAMIN B-12,) 1000 MCG/ML injection Inject 1 ml (1000 mcg ) IM weekly x 4,  Then monthly thereafter (Patient taking differently: Inject 1 ml (1000 mcg ) IM monthly)  . diazepam (VALIUM) 5 MG tablet Take 1 tablet (5 mg total) by mouth at bedtime as needed for anxiety. Related to CPAP use  . Dulaglutide (TRULICITY) 7.25 DG/6.4QI SOPN Inject 0.75 mg into the skin once a week.  . DULoxetine (CYMBALTA) 30 MG capsule Take 1 capsule (30 mg total) by mouth daily.  . Eszopiclone 3 MG TABS Take 1 tablet (3 mg total) by mouth at bedtime. Take immediately before bedtime  . glucose blood test strip Use to test blood sugar once daily. Dx code = E11.9  . Lancet Devices (ONE TOUCH DELICA LANCING DEV) MISC Use to test blood glucose once daily. Dx code = E11.9  . levETIRAcetam (KEPPRA) 500 MG tablet Take 1 tablet (500 mg total) by mouth 2 (two) times daily. (Patient taking differently: Take 500 mg by mouth at bedtime. )  . NONFORMULARY OR COMPOUNDED [place]  Combination Pain Cream -  Baclofen 2%, Doxepin 5%, Gabapentin 6%, Topiramate 2%, Pentoxifylline 3% Apply 1-2 grams to affected  area 3-4 times daily Qty. 120 gm 3 refills  . nystatin [facility]) powder Apply topically 4 (four) times daily.  . ondansetron (ZOFRAN) 4 MG tablet Take 1 tablet (4 mg total) by mouth every 8 (eight) hours as needed for nausea or vomiting.  Glory Rosebush DELICA LANCETS FINE MISC Use to test blood sguar once daily. Dx code = E11.9  . pregabalin (LYRICA) 100 MG capsule Take 100 mg by mouth daily.  . rosuvastatin (CRESTOR) 10 MG tablet Take 1 tablet (10 mg total) by mouth daily.  . Syringe/Needle, Disp, (SYRINGE 3CC/25GX1") 25G X 1" 3 ML MISC Use for b12 injections  . traMADol (ULTRAM) 50 MG tablet Take 0.5 tablets (25 mg total) by mouth every 12 (twelve) hours as needed for severe pain.   No facility-administered encounter medications on file as of 12/23/2017.     Activities of Daily Living In your present state of health, do you have any difficulty performing the following activities: 12/23/2017 08/20/2017  Hearing? Y N  Vision? N N  Difficulty concentrating or making decisions? Y N  Comment States she has difficulty concentrating  -  Walking or climbing stairs? Y N  Comment Unsteady gait. Cane in use.  -  Dressing or bathing? Y N  Comment Husband assists as needed -  Doing errands, shopping? Y Y  Comment She does not drive Facilities manager and eating ? N -  Using the Toilet? N -  In the past six months, have you accidently leaked urine? Y -  Comment Managed with a daily brief -  Do you have problems with loss of bowel control? Y -  Comment Managed with a daily brief -  Managing your Medications? N -  Managing your Finances? N -  Housekeeping or managing your Housekeeping? Y -  Comment Husband assists -  Some recent data might be hidden    Patient Care Team: [name], MD as PCP - General (Internal Medicine) End, Harrell Gave, MD as PCP - Cardiology (Cardiology) Chiropractic, [facility] as Referring Physician    Assessment:   This is a routine wellness examination for [place].  The  goal of the wellness visit is to assist the patient how to close the gaps in care and create a preventative care plan for the patient.   The roster of all physicians providing medical care to patient is listed in the Snapshot section of the chart.  Osteoporosis risk reviewed.    Safety issues reviewed; Smoke and carbon monoxide detectors in the home. No firearms in the home. Wears seatbelts when riding with others. No violence in the home.  They do not have excessive sun exposure.  Discussed the need for sun protection: hats, long sleeves and the use of sunscreen if there is significant sun exposure.  Patient is alert, normal appearance, oriented to person/place/and time.  Correctly identified the president of the [place] and recalls of 2/3 words. Performs simple calculations and can read correct time from watch face. Displays appropriate judgement.  No new identified risk were noted.  No failures at ADL's or IADL's.  Ambulates with cane.   BMI- discussed the importance of a healthy diet, water intake and the benefits of aerobic exercise. Educational material provided.   24 hour diet recall: Regular diet, portioned controlled.   Dental- UTD.   Blood sugar home record today 166.  Monitoring daily.   Eye- Visual acuity not assessed per patient preference since they have regular follow up with the ophthalmologist.  Wears corrective lenses.  Sleep patterns- CPAP in use.  Naps during the day.   [place]- diabetic exam every 3 months. Skin P/W/D.   Exercise Activities and Dietary recommendations Current Exercise Habits: The patient does not participate in regular exercise at present  Goals    . DIET - INCREASE WATER INTAKE     Stay hydrated    . INCREASE LEAN PROTEINS     Fresh vegetables and fruits Low carb diet      . Increase physical activity     Stay active. Walk as tolerated.  Chair exercises.        Fall Risk Fall Risk  12/23/2017 11/09/2016 07/13/2016  11/03/2015 06/23/2014  Falls in the past year? Yes Yes No Yes Yes  Number falls in past yr: 1 2 or more - 2 or more -  Injury with Fall? No No - No -  Risk Factor Category  High Fall Risk High Fall Risk - - -  Risk for fall due to : History of fall(s);Impaired balance/gait History of fall(s) - Impaired balance/gait Impaired balance/gait;Impaired mobility  Follow up Falls prevention discussed;Education provided Education provided;Falls prevention discussed - Falls prevention discussed -   Depression Screen PHQ 2/9 Scores 12/23/2017 11/09/2016 07/13/2016 11/03/2015  PHQ - 2 Score 1 0 0 2  PHQ- 9 Score - 0 - 4     Cognitive Function MMSE - Mini Mental State Exam 12/23/2017 11/03/2015 11/03/2015  Orientation to time '5 5 5  ' Orientation to Place '5 5 5  ' Registration '3 3 3  ' Attention/ Calculation '5 5 5  ' Recall '2 3 3  ' Language- name 2 objects '2 2 2  ' Language- repeat '1 1 1  ' Language- follow 3 step command '3 3 3  ' Language- read & follow direction '1 1 1  ' Write a sentence 1 1 1  Copy design '1 1 1  ' Total score '29 30 30     ' 6CIT Screen 11/09/2016  What Year? 0 points  What month? 0 points  What time? 0 points  Count back from 20 0 points  Months in reverse 0 points  Repeat phrase 6 points  Total Score 6    Immunization History  Administered Date(s) Administered  . Influenza Split 06/13/2011  . Influenza,inj,Quad PF,6+ Mos 05/04/2014, 05/12/2015  . Influenza-Unspecified 04/15/2012  . Pneumococcal Conjugate-13 08/11/2013, 01/06/2014  . Pneumococcal Polysaccharide-23 01/19/2010  . Tdap 04/18/2016   Screening Tests Health Maintenance  Topic Date Due  . PNA vac Low Risk Adult (2 of 2 - PPSV23) 01/20/2015  . OPHTHALMOLOGY EXAM  10/15/2017  . FOOT EXAM  11/05/2017  . INFLUENZA VACCINE  02/13/2018  . HEMOGLOBIN A1C  05/22/2018  . URINE MICROALBUMIN  06/04/2018  . MAMMOGRAM  12/06/2018  . COLONOSCOPY  01/07/2024  . TETANUS/TDAP  04/18/2026  . DEXA SCAN  Completed  . Hepatitis C Screening   Completed      Plan:    End of life planning; Advance aging; Advanced directives discussed. Copy of current HCPOA/Living Will on file.    I have personally reviewed and noted the following in the patient's chart:   . Medical and social history . Use of alcohol, tobacco or illicit drugs  . Current medications and supplements . Functional ability and status . Nutritional status . Physical activity . Advanced directives . List of other physicians . Hospitalizations, surgeries, and ER visits in previous 12 months . Vitals . Screenings to include cognitive, depression, and falls . Referrals and appointments  In addition, I have reviewed and discussed with patient certain preventive protocols, quality metrics, and best practice recommendations. A written personalized care plan for preventive services as well as general preventive health recommendations were provided to patient.     OBrien-Blaney, Edi Gorniak L, LPN  4/49/2010    I have reviewed the above information and agree with above.   Deborra Medina, MD

## 2017-12-24 ENCOUNTER — Telehealth: Payer: Self-pay

## 2017-12-24 NOTE — Telephone Encounter (Addendum)
Patient daughter is concerned because patient called her and said "I am so depressed and she doesn't know why she keeps trying." patient daughter wants to know more about her mother medical and how she can help her mother. Wants to learn more about her medications and what she is on. Advised she would need to set up appointment to come in with mother and discuss patient daughter scheduled appointment.FYI?

## 2017-12-24 NOTE — Telephone Encounter (Signed)
Yes she needs t to make an appt and accompany her mother.

## 2017-12-24 NOTE — Telephone Encounter (Signed)
Copied from [place] [phone]. Topic: Inquiry >> Dec 24, 2017 10:11 AM Conception Chancy, NT wrote: Reason for CRM: patient daughter Barney Drain would like for [name] to contact her so they can discuss her mothers health. Please advise.

## 2017-12-25 ENCOUNTER — Ambulatory Visit: Payer: Medicare Other | Admitting: Internal Medicine

## 2017-12-25 ENCOUNTER — Other Ambulatory Visit: Payer: Self-pay | Admitting: Internal Medicine

## 2017-12-25 DIAGNOSIS — Z6841 Body Mass Index (BMI) 40.0 and over, adult: Secondary | ICD-10-CM | POA: Diagnosis not present

## 2017-12-25 DIAGNOSIS — R1084 Generalized abdominal pain: Secondary | ICD-10-CM | POA: Diagnosis not present

## 2017-12-25 DIAGNOSIS — K219 Gastro-esophageal reflux disease without esophagitis: Secondary | ICD-10-CM | POA: Diagnosis not present

## 2017-12-25 DIAGNOSIS — Z9884 Bariatric surgery status: Secondary | ICD-10-CM | POA: Diagnosis not present

## 2017-12-25 NOTE — Telephone Encounter (Signed)
Rx refill

## 2017-12-25 NOTE — Telephone Encounter (Signed)
Eszopiclone 3mg  refill Last Refill: 09/19/17 #90  Diazepam(Valium) 5mg  tab Last Refill: 11/19/17 #30  Last OV: 11/19/17 PCP: [name] Pharmacy:[facility]     [place]

## 2017-12-25 NOTE — Telephone Encounter (Signed)
Copied from [place] [id number]. Topic: Quick Communication - Rx Refill/Question >> Dec 25, 2017 10:06 AM Lysle Morales L, NT wrote: Medication:  Eszopiclone 3 MG TABS , and also diazepam (VALIUM) 5 MG tablet patient would like for this to increase tO 10 MG  Has the patient contacted their pharmacy? yes no (Agent: If no, request that the patient contact the pharmacy for the refill. (Agent: If yes, when and what did the pharmacy advise? Preferred Pharmacy (with phone number or street name): [facility] - [place], [place] - [address] (Phone) [phone] (Fax) Please call [phone] if there are any questions   Agent: Please be advised that RX refills may take up to 3 business days. We ask that you follow-up with your pharmacy.

## 2017-12-26 DIAGNOSIS — M6281 Muscle weakness (generalized): Secondary | ICD-10-CM | POA: Diagnosis not present

## 2017-12-26 DIAGNOSIS — M25611 Stiffness of right shoulder, not elsewhere classified: Secondary | ICD-10-CM | POA: Diagnosis not present

## 2017-12-26 DIAGNOSIS — M25511 Pain in right shoulder: Secondary | ICD-10-CM | POA: Diagnosis not present

## 2017-12-26 MED ORDER — DIAZEPAM 5 MG PO TABS: 5.0000 mg | ORAL_TABLET | Freq: Every evening | ORAL | 5 refills | Status: DC | PRN

## 2017-12-26 MED ORDER — ESZOPICLONE 3 MG PO TABS: 3.0000 mg | ORAL_TABLET | Freq: Every day | ORAL | 0 refills | Status: DC

## 2017-12-26 NOTE — Telephone Encounter (Signed)
Printed, signed and faxed.  

## 2017-12-26 NOTE — Telephone Encounter (Signed)
[place]     Refilled: 09/19/2017  Diazepam    Refilled: 11/19/2017  Last OV: 11/19/2017 Next OV: 01/01/2018

## 2017-12-27 DIAGNOSIS — R1084 Generalized abdominal pain: Secondary | ICD-10-CM | POA: Diagnosis not present

## 2017-12-30 ENCOUNTER — Encounter: Payer: Self-pay | Admitting: Pharmacist

## 2017-12-30 ENCOUNTER — Ambulatory Visit (INDEPENDENT_AMBULATORY_CARE_PROVIDER_SITE_OTHER): Payer: Medicare Other | Admitting: Pharmacist

## 2017-12-30 VITALS — BP 112/70 | HR 88 | Ht 64.5 in | Wt 251.0 lb

## 2017-12-30 DIAGNOSIS — E1142 Type 2 diabetes mellitus with diabetic polyneuropathy: Secondary | ICD-10-CM

## 2017-12-30 DIAGNOSIS — M25511 Pain in right shoulder: Secondary | ICD-10-CM | POA: Diagnosis not present

## 2017-12-30 DIAGNOSIS — M25611 Stiffness of right shoulder, not elsewhere classified: Secondary | ICD-10-CM | POA: Diagnosis not present

## 2017-12-30 DIAGNOSIS — M6281 Muscle weakness (generalized): Secondary | ICD-10-CM | POA: Diagnosis not present

## 2017-12-30 MED ORDER — DULAGLUTIDE 0.75 MG/0.5ML ~~LOC~~ SOAJ: 0.7500 mg | SUBCUTANEOUS | 3 refills | Status: DC

## 2017-12-30 NOTE — Patient Instructions (Signed)
Good to see you. I think you should be encouraged by your blood sugar numbers. As long as your stomach issues are NO DIFFERENT and NO WORSE than before you started Trulicity, I am OK with you staying on it. I am hesitant to increase your dose however. You can stay on this dose for the next 6-8 weeks and we can revisit. The new pharmacist taking over is named Catie Darnelle Maffucci and she will help you after January 24, 2018. She will have my same phone number.   Make an appointment to see the pharmacist in 6-8 weeks. Follow up with [name] as usual in the meantime.

## 2017-12-30 NOTE — Progress Notes (Signed)
    S:     Chief Complaint  Patient presents with  . Medication Management    Diabetes    Patient arrives in fair spirits, ambulating without assistance, accompanied by husband.  Presents for diabetes evaluation, education, and management at the request of [name] (referred on 11/21/17 - lab note).   Today patient reports she has been to her GI doctor for the abdominal pain and she got a CT scan with contrast. She reports since this the pain in her pelvic area is a little better than previous but insists that the abdominal complaints have been consistent well before start of trulicity. Continues to report diminished appetite since discharge from skilled nursing facility several months ago. Denies any correlation with severity of sx and injection timing of Trulicity. Denies vomiting. She brings in CBG log book for review.  Patient reports adherence with medications.  Current diabetes medications include: Trulicity 9.89 mg weekly Current hypertension medications include: none  Patient denies hypoglycemic events.  Patient reported dietary habits: Eats 2-3 meals/day Breakfast:oatmeal Lunch:ham, salad Dinner:fried chicken, creamed potatoes  Snacks: cheese crackers, popcorn, corn chips, jello + whipped cream Drinks:unsweet tea, water + crystal light  Patient reported exercise habits: none   O:  Physical Exam  Constitutional: She appears well-developed and well-nourished.     Review of Systems  All other systems reviewed and are negative.    Lab Results  Component Value Date   HGBA1C 9.1 (H) 11/19/2017   Vitals:   12/30/17 1631  BP: 112/70  Pulse: 88    Lipid Panel     Component Value Date/Time   CHOL 183 06/04/2017 1412   CHOL 155 02/27/2014 0420   TRIG 195.0 (H) 06/04/2017 1412   TRIG 121 02/27/2014 0420   HDL 48.50 06/04/2017 1412   HDL 43 02/27/2014 0420   CHOLHDL 4 06/04/2017 1412   VLDL 39.0 06/04/2017 1412   VLDL 24 02/27/2014 0420   LDLCALC 96 06/04/2017  1412   LDLCALC 88 02/27/2014 0420   LDLDIRECT 111.0 06/16/2015 0936    Home fasting CBG: 150s 2 hour post-prandial/random CBG: 150s-170s  Clinical ASCVD:No;High-risk conditions: Age 53 or older, DM, HTN  ASCVD risk factors: age 72-75 10 year ASCVD risk:22.2%   A/P: #Diabetes longstanding currently with improved control per CBG log, aided by weight loss. Patient denies hypoglycemic events and is able to verbalize appropriate hypoglycemia management plan. Patient reports adherence with medication. Control is suboptimal due to insulin resistance, dietary indiscretion, sedentary lifestyle. - Continue Trulicity 2.11 mg weekly - Next A1C anticipated 02/19/2018 or later  #ASCVD risk - primary prevention in patient aged 10-75 with DM, baseline LDL 70-189, high intensity statinand aspirin for primary preventionindicated.  -Patient not taking aspirin or statin at this time, consider re-initiating both at follow up visit  Written patient instructions provided.  Total time in face to face counseling 30 minutes.    Follow up in Pharmacist Clinic Visit 6-8 weeks.    Carlean Jews, Pharm.D., BCPS, CPP PGY2 Ambulatory Care Pharmacy Resident Phone: [phone]

## 2017-12-30 NOTE — Assessment & Plan Note (Signed)
#  Diabetes longstanding currently with improved control per CBG log, aided by weight loss. Patient denies hypoglycemic events and is able to verbalize appropriate hypoglycemia management plan. Patient reports adherence with medication. Control is suboptimal due to insulin resistance, dietary indiscretion, sedentary lifestyle. - Continue Trulicity 0.07 mg weekly - Next A1C anticipated 02/19/2018 or later  #ASCVD risk - primary prevention in patient aged 72-75 with DM, baseline LDL 70-189, high intensity statinand aspirin for primary preventionindicated.  -Patient not taking aspirin or statin at this time, consider re-initiating both at follow up visit

## 2017-12-31 NOTE — Progress Notes (Signed)
  I have reviewed the above information and agree with above.   Akasia Ahmad, MD 

## 2018-01-01 ENCOUNTER — Ambulatory Visit (INDEPENDENT_AMBULATORY_CARE_PROVIDER_SITE_OTHER): Payer: Medicare Other | Admitting: Internal Medicine

## 2018-01-01 ENCOUNTER — Encounter: Payer: Self-pay | Admitting: Internal Medicine

## 2018-01-01 VITALS — BP 126/72 | HR 74 | Temp 98.3°F | Resp 15 | Ht 64.5 in | Wt 251.8 lb

## 2018-01-01 DIAGNOSIS — F5105 Insomnia due to other mental disorder: Secondary | ICD-10-CM | POA: Diagnosis not present

## 2018-01-01 DIAGNOSIS — F419 Anxiety disorder, unspecified: Secondary | ICD-10-CM

## 2018-01-01 DIAGNOSIS — K580 Irritable bowel syndrome with diarrhea: Secondary | ICD-10-CM | POA: Diagnosis not present

## 2018-01-01 DIAGNOSIS — N8110 Cystocele, unspecified: Secondary | ICD-10-CM | POA: Diagnosis not present

## 2018-01-01 DIAGNOSIS — M25511 Pain in right shoulder: Secondary | ICD-10-CM | POA: Diagnosis not present

## 2018-01-01 DIAGNOSIS — E1142 Type 2 diabetes mellitus with diabetic polyneuropathy: Secondary | ICD-10-CM

## 2018-01-01 DIAGNOSIS — K760 Fatty (change of) liver, not elsewhere classified: Secondary | ICD-10-CM

## 2018-01-01 MED ORDER — CITALOPRAM HYDROBROMIDE 10 MG PO TABS: 10.0000 mg | ORAL_TABLET | Freq: Every day | ORAL | 0 refills | Status: DC

## 2018-01-01 NOTE — Progress Notes (Signed)
Subjective:  Patient ID: Denise Sharp, female    DOB: 1946-01-09  Age: 72 y.o. MRN: [id number]  CC: The primary encounter diagnosis was Irritable bowel syndrome with diarrhea. Diagnoses of DM type 2 with diabetic peripheral neuropathy [facility]), Female bladder prolapse, Insomnia secondary to anxiety, Vaginal prolapse, and Hepatic steatosis were also pertinent to this visit.  HPI Denise Sharp presents for follow up on chronic health problems.  Seeing Bariatrics for right sided abdominal pain from RUQ to inguinal area ,  Has been  chronic for one year, with inability to eat more than 4 spoons of baby food at a time.  (reported with no appreciable loss in weight .    Has nausea,  No vomiting,  And diarrhea has returned since leaving the nursing home.  (was having 2 formed bowel movements daily while in he NH) .  Eating 6 times daily , small amounts of take out food or fast food (fried chicken ,  Creamed potatoes) :     CT abd pelvis last week at [place] for evaluation of abd pain .   Fatty liver .  No adenopathy no renal masses.  Normal aorta . DDD spine   Barium swallow was normal.    No HH   Has been having vaginal bulge that is painful  on the right side.  No dysuria prolapse "feels like the head of a baby is crowning"  Has an appt with urology in Sept at [facility]  For interstim  Insomnia and anxiety Taking valium, dose reduced byme to  5 mg and lunesta to 3 mg  After recent hospitalization complicated by encephalopathy.  Only getting 3 hours of sleep  Takes Keppra instead of Lyrica bid  For the neuropathy and leg cramps (lyrica caused swelling)   Discussed referral to [facility] for pelvic exam   Having pain in the right abdominal  Wall    Outpatient Medications Prior to Visit  Medication Sig Dispense Refill  . albuterol (PROVENTIL HFA;VENTOLIN HFA) 108 (90 Base) MCG/ACT inhaler Inhale 2 puffs into the lungs every 8 (eight) hours as needed for wheezing or shortness of breath. 1  Inhaler 0  . aspirin EC 81 MG tablet Take 1 tablet (81 mg total) by mouth daily. 90 tablet 3  . benzonatate (TESSALON) 200 MG capsule Take 1 capsule (200 mg total) by mouth 2 (two) times daily as needed for cough. 30 capsule 0  . Blood Glucose Monitoring Suppl (ONETOUCH VERIO) w/Device KIT 1 Device by Does not apply route daily. Use to test blood glucose once daily. Dx code = E11.9 1 kit 0  . butalbital-acetaminophen-caffeine (FIORICET, ESGIC) 50-325-40 MG tablet Take 1 tablet by mouth every 6 (six) hours as needed for headache. 60 tablet 0  . cyanocobalamin (,VITAMIN B-12,) 1000 MCG/ML injection Inject 1 ml (1000 mcg ) IM weekly x 4,  Then monthly thereafter (Patient taking differently: Inject 1 ml (1000 mcg ) IM monthly) 110 mL 0  . diazepam (VALIUM) 5 MG tablet Take 1 tablet (5 mg total) by mouth at bedtime as needed for anxiety. Related to CPAP use 30 tablet 5  . dicyclomine (BENTYL) 10 MG capsule Take 10 mg by mouth 4 (four) times daily -  before meals and at bedtime.    . Dulaglutide (TRULICITY) 0.62 IR/4.8NI SOPN Inject 0.75 mg into the skin once a week. 12 pen 3  . Eszopiclone 3 MG TABS Take 1 tablet (3 mg total) by mouth at bedtime. Take immediately  before bedtime 90 tablet 0  . glucose blood test strip Use to test blood sugar once daily. Dx code = E11.9 100 each 12  . Lancet Devices (ONE TOUCH DELICA LANCING DEV) MISC Use to test blood glucose once daily. Dx code = E11.9 1 each 0  . levETIRAcetam (KEPPRA) 500 MG tablet Take 1 tablet (500 mg total) by mouth 2 (two) times daily. (Patient taking differently: Take 500 mg by mouth at bedtime. ) 180 tablet 1  . NONFORMULARY OR COMPOUNDED [place]  Combination Pain Cream -  Baclofen 2%, Doxepin 5%, Gabapentin 6%, Topiramate 2%, Pentoxifylline 3% Apply 1-2 grams to affected area 3-4 times daily Qty. 120 gm 3 refills 1 each 3  . nystatin (NYAMYC) powder Apply topically 4 (four) times daily.    . ondansetron (ZOFRAN) 4 MG tablet  Take 1 tablet (4 mg total) by mouth every 8 (eight) hours as needed for nausea or vomiting. 10 tablet 0  . ONETOUCH DELICA LANCETS FINE MISC Use to test blood sguar once daily. Dx code = E11.9 100 each 3  . pregabalin (LYRICA) 100 MG capsule Take 100 mg by mouth daily.    . rosuvastatin (CRESTOR) 10 MG tablet Take 1 tablet (10 mg total) by mouth daily. 90 tablet 3  . Syringe/Needle, Disp, (SYRINGE 3CC/25GX1") 25G X 1" 3 ML MISC Use for b12 injections 50 each 0  . traMADol (ULTRAM) 50 MG tablet Take 0.5 tablets (25 mg total) by mouth every 12 (twelve) hours as needed for severe pain. 30 tablet 0  . DULoxetine (CYMBALTA) 30 MG capsule Take 1 capsule (30 mg total) by mouth daily. (Patient not taking: Reported on 01/01/2018) 180 capsule 1   No facility-administered medications prior to visit.     Review of Systems;  Patient denies headache, fevers, malaise, unintentional weight loss, skin rash, eye pain, sinus congestion and sinus pain, sore throat, dysphagia,  hemoptysis , cough, dyspnea, wheezing, chest pain, palpitations, orthopnea, edema, abdominal pain, nausea, melena, diarrhea, constipation, flank pain, dysuria, hematuria, urinary  Frequency, nocturia, numbness, tingling, seizures,  Focal weakness, Loss of consciousness,  Tremor, insomnia, depression, anxiety, and suicidal ideation.      Objective:  BP 126/72 (BP Location: Left Arm, Patient Position: Sitting, Cuff Size: Normal)   Pulse 74   Temp 98.3 F (36.8 C) (Oral)   Resp 15   Ht 5' 4.5" (1.638 m)   Wt 251 lb 12.8 oz (114.2 kg)   SpO2 95%   BMI 42.55 kg/m   BP Readings from Last 3 Encounters:  01/01/18 126/72  12/30/17 112/70  12/23/17 118/78    Wt Readings from Last 3 Encounters:  01/01/18 251 lb 12.8 oz (114.2 kg)  12/30/17 251 lb (113.9 kg)  12/23/17 249 lb (112.9 kg)    General appearance: alert, cooperative and appears stated age Ears: normal TM's and external ear canals both ears Throat: lips, mucosa, and tongue  normal; teeth and gums normal Neck: no adenopathy, no carotid bruit, supple, symmetrical, trachea midline and thyroid not enlarged, symmetric, no tenderness/mass/nodules Back: symmetric, no curvature. ROM normal. No CVA tenderness. Lungs: clear to auscultation bilaterally Heart: regular rate and rhythm, S1, S2 normal, no murmur, click, rub or gallop Abdomen: soft, non-tender; bowel sounds normal; no masses,  no organomegaly Pulses: 2+ and symmetric Skin: Skin color, texture, turgor normal. No rashes or lesions Lymph nodes: Cervical, supraclavicular, and axillary nodes normal.  Lab Results  Component Value Date   HGBA1C 9.1 (H) 11/19/2017   HGBA1C  7.6 (H) 07/30/2017   HGBA1C 8.1 (H) 06/04/2017    Lab Results  Component Value Date   CREATININE 0.82 11/19/2017   CREATININE 0.87 09/19/2017   CREATININE 0.98 08/21/2017    Lab Results  Component Value Date   WBC 10.3 11/19/2017   HGB 11.9 (L) 11/19/2017   HCT 36.7 11/19/2017   PLT 286.0 11/19/2017   GLUCOSE 242 (H) 11/19/2017   CHOL 183 06/04/2017   TRIG 195.0 (H) 06/04/2017   HDL 48.50 06/04/2017   LDLDIRECT 111.0 06/16/2015   LDLCALC 96 06/04/2017   ALT 21 11/19/2017   AST 23 11/19/2017   NA 139 11/19/2017   K 3.7 11/19/2017   CL 103 11/19/2017   CREATININE 0.82 11/19/2017   BUN 14 11/19/2017   CO2 26 11/19/2017   TSH 2.279 08/20/2017   HGBA1C 9.1 (H) 11/19/2017   MICROALBUR 2.8 (H) 06/04/2017    Dg Shoulder Right  Result Date: 08/20/2017 CLINICAL DATA:  Right shoulder pain.  Fall EXAM: RIGHT SHOULDER - 2+ VIEW COMPARISON:  08/13/2017 FINDINGS: Prior right shoulder replacement. No hardware complicating feature or acute bony abnormality. No fracture, subluxation or dislocation. IMPRESSION: Prior right shoulder replacement.  No acute findings. Electronically Signed   By: Rolm Baptise M.D.   On: 08/20/2017 11:13   Ct Head Wo Contrast  Result Date: 08/19/2017 CLINICAL DATA:  Sudden mental status changes, currently  unresponsive. EXAM: CT HEAD WITHOUT CONTRAST TECHNIQUE: Contiguous axial images were obtained from the base of the skull through the vertex without intravenous contrast. COMPARISON:  02/17/2017 FINDINGS: Brain: Age related atrophy. Chronic appearing small vessel ischemic changes the hemispheric white matter. No sign of acute infarction, mass lesion, hemorrhage, hydrocephalus or extra-axial collection. Vascular: There is atherosclerotic calcification of the major vessels at the base of the brain. Skull: Normal Sinuses/Orbits: Clear/normal Other: None IMPRESSION: No acute finding by CT. Age related atrophy and mild chronic small-vessel ischemic change of the white matter. Electronically Signed   By: Nelson Chimes M.D.   On: 08/19/2017 20:41   Ct Angio Chest Pe W Or Wo Contrast  Result Date: 08/20/2017 CLINICAL DATA:  Elevated D-dimer.  Recent shoulder surgery. EXAM: CT ANGIOGRAPHY CHEST WITH CONTRAST TECHNIQUE: Multidetector CT imaging of the chest was performed using the standard protocol during bolus administration of intravenous contrast. Multiplanar CT image reconstructions and MIPs were obtained to evaluate the vascular anatomy. CONTRAST:  154m ISOVUE-370 IOPAMIDOL (ISOVUE-370) INJECTION 76% COMPARISON:  Chest radiograph, 08/19/2017.  Chest CTA, 12/07/2015. FINDINGS: Cardiovascular: Satisfactory opacification of the pulmonary arteries to the segmental level. No evidence of pulmonary embolism. Normal heart size. No pericardial effusion. Mild coronary artery calcifications. Great vessels normal in caliber. Aorta is not opacified. No aortic atherosclerotic calcifications. Mediastinum/Nodes: Visualized thyroid is unremarkable. No mediastinal or hilar masses. No pathologically enlarged lymph nodes. Trachea is widely patent. Esophagus is unremarkable. Lungs/Pleura: There is right lower lobe opacity most consistent with atelectasis. Minimal subsegmental atelectasis noted in the dependent left lower lobe. Lungs  otherwise clear. Trace right pleural effusion. No left pleural effusion. No pneumothorax. Upper Abdomen: Changes from prior gastric surgery stable from the prior CT. 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 No acute findings in the visible upper abdomen. Musculoskeletal: There is edema surrounding the new right shoulder prosthesis. No acute fracture. No osteoblastic or osteolytic lesions. Review of the MIP images confirms the above findings. IMPRESSION: 1. No evidence of a pulmonary embolism. 2. Right lower lobe atelectasis and minimal subsegmental left lower lobe  atelectasis. No convincing pneumonia. No pulmonary edema. Trace right pleural effusion. Electronically Signed   By: Lajean Manes M.D.   On: 08/20/2017 17:19   Dg Chest Portable 1 View  Result Date: 08/19/2017 CLINICAL DATA:  Recent shoulder surgery. Altered mental status today. EXAM: PORTABLE CHEST 1 VIEW COMPARISON:  06/13/2017. FINDINGS: Poor inspiration. Allowing for that, the heart and mediastinum are normal in the lungs are clear. No collapse or visible infiltrate. Reverse shoulder replacement on the right. IMPRESSION: Poor inspiration.  No active disease suspected. Electronically Signed   By: Nelson Chimes M.D.   On: 08/19/2017 20:35    Assessment & Plan:   Problem List Items Addressed This Visit    Vaginal prolapse    Suggested by history .  Given her size will refer to Gynecology for evaluation       Insomnia secondary to anxiety    Currently taking lunesta 3 mg and valium 5 mg.  I iwll not increase her medications but will add citalopram starting with 5 mg dose        Relevant Medications   citalopram (CELEXA) 10 MG tablet   Hepatic steatosis    Advised to resume Crestor, reduce tylenlol to 1000 mg daily and follow a low GI diet      DM type 2 with diabetic peripheral neuropathy ([place])    She has had difficulty dinging appropriate food choices due to immobility and IBS type symptoms (based on  negative workup for diarrhea) Referral to nutritionist       Relevant Medications   citalopram (CELEXA) 10 MG tablet   Other Relevant Orders   Amb Referral to Nutrition and Diabetic E    Other Visit Diagnoses    Irritable bowel syndrome with diarrhea    -  Primary   Relevant Orders   Amb Referral to Nutrition and Diabetic E   Female bladder prolapse       Relevant Orders   Ambulatory referral to Gynecology     A total of 25 minutes of face to face time was spent with patient more than half of which was spent in counselling about the above mentioned conditions  and coordination of care   I have discontinued Denise Sharp's DULoxetine. I am also having her start on citalopram. Additionally, I am having her maintain her albuterol, butalbital-acetaminophen-caffeine, aspirin EC, rosuvastatin, ondansetron, traMADol, levETIRAcetam, nystatin, pregabalin, cyanocobalamin, SYRINGE 3CC/25GX1", NONFORMULARY OR COMPOUNDED ITEM, benzonatate, glucose blood, ONE TOUCH DELICA LANCING DEV, ONETOUCH DELICA LANCETS FINE, ONETOUCH VERIO, diazepam, Eszopiclone, dicyclomine, and Dulaglutide.  Meds ordered this encounter  Medications  . citalopram (CELEXA) 10 MG tablet    Sig: Take 1 tablet (10 mg total) by mouth daily.    Dispense:  30 tablet    Refill:  0    Medications Discontinued During This Encounter  Medication Reason  . DULoxetine (CYMBALTA) 30 MG capsule     Follow-up: No follow-ups on file.   [name], MD

## 2018-01-01 NOTE — Patient Instructions (Addendum)
I recommend seeing a nutritionist to help you with your dietary needs, and a gynecologist for your pelvic issues  I will make the referrals  Please start the  Celexa  (citalopram) at 1/2 tablet daily in the evening for the first few days to avoid nausea.  You can increase to a full tablet after 4 days if you havenot developed side effects of nausea.  We can increase the dose during Month 2 to 20 mg if needed  We can increase the Lunesta to 4 mg at your next refill   If the lexapro interferes with your sleep, take it in the morning instead  Please return in  4 weeks ,  Or e mail me to let me know how it is helping your depression   Reduce the tylenol to 1000 mg daily  And continue the Crestor  Because you have fatty liver   Use the tramadol  In between if needed   Continue monthly B12  Injections   +

## 2018-01-04 DIAGNOSIS — K760 Fatty (change of) liver, not elsewhere classified: Secondary | ICD-10-CM | POA: Insufficient documentation

## 2018-01-04 DIAGNOSIS — N8110 Cystocele, unspecified: Secondary | ICD-10-CM | POA: Insufficient documentation

## 2018-01-04 NOTE — Assessment & Plan Note (Signed)
She has had difficulty dinging appropriate food choices due to immobility and IBS type symptoms (based on negative workup for diarrhea) Referral to nutritionist

## 2018-01-04 NOTE — Assessment & Plan Note (Signed)
Currently taking lunesta 3 mg and valium 5 mg.  I iwll not increase her medications but will add citalopram starting with 5 mg dose

## 2018-01-04 NOTE — Assessment & Plan Note (Signed)
Advised to resume Crestor, reduce tylenlol to 1000 mg daily and follow a low GI diet

## 2018-01-04 NOTE — Assessment & Plan Note (Signed)
Suggested by history .  Given her size will refer to Gynecology for evaluation

## 2018-01-09 ENCOUNTER — Encounter: Payer: Self-pay | Admitting: Internal Medicine

## 2018-01-13 ENCOUNTER — Ambulatory Visit (INDEPENDENT_AMBULATORY_CARE_PROVIDER_SITE_OTHER): Payer: Medicare Other | Admitting: Podiatry

## 2018-01-13 ENCOUNTER — Encounter: Payer: Self-pay | Admitting: Podiatry

## 2018-01-13 ENCOUNTER — Encounter: Payer: Self-pay | Admitting: Dietician

## 2018-01-13 ENCOUNTER — Encounter: Payer: Medicare Other | Attending: Internal Medicine | Admitting: Dietician

## 2018-01-13 VITALS — Ht 64.0 in | Wt 249.5 lb

## 2018-01-13 DIAGNOSIS — M79676 Pain in unspecified toe(s): Secondary | ICD-10-CM | POA: Diagnosis not present

## 2018-01-13 DIAGNOSIS — E1149 Type 2 diabetes mellitus with other diabetic neurological complication: Secondary | ICD-10-CM

## 2018-01-13 DIAGNOSIS — Z713 Dietary counseling and surveillance: Secondary | ICD-10-CM | POA: Diagnosis not present

## 2018-01-13 DIAGNOSIS — Z6841 Body Mass Index (BMI) 40.0 and over, adult: Secondary | ICD-10-CM | POA: Diagnosis not present

## 2018-01-13 DIAGNOSIS — E1142 Type 2 diabetes mellitus with diabetic polyneuropathy: Secondary | ICD-10-CM | POA: Insufficient documentation

## 2018-01-13 DIAGNOSIS — I10 Essential (primary) hypertension: Secondary | ICD-10-CM | POA: Diagnosis not present

## 2018-01-13 DIAGNOSIS — Z9884 Bariatric surgery status: Secondary | ICD-10-CM | POA: Diagnosis not present

## 2018-01-13 DIAGNOSIS — B351 Tinea unguium: Secondary | ICD-10-CM

## 2018-01-13 DIAGNOSIS — G4730 Sleep apnea, unspecified: Secondary | ICD-10-CM | POA: Diagnosis not present

## 2018-01-13 DIAGNOSIS — K580 Irritable bowel syndrome with diarrhea: Secondary | ICD-10-CM

## 2018-01-13 DIAGNOSIS — M129 Arthropathy, unspecified: Secondary | ICD-10-CM

## 2018-01-13 DIAGNOSIS — E66813 Obesity, class 3: Secondary | ICD-10-CM

## 2018-01-13 DIAGNOSIS — E1140 Type 2 diabetes mellitus with diabetic neuropathy, unspecified: Secondary | ICD-10-CM

## 2018-01-13 DIAGNOSIS — K219 Gastro-esophageal reflux disease without esophagitis: Secondary | ICD-10-CM | POA: Diagnosis not present

## 2018-01-13 NOTE — Patient Instructions (Signed)
   Work to eat something every 3-5 hours while awake. OK to try a protein drink when unable to eat a meal.   Control portions of carbs and aim for about 15 grams each meal to keep blood sugar consistent and control calories.   Always eat protein foods first; chew foods at least 20 times each bite, until applesauce consistency before swallowing.   Try adding protein powder (unflavored) to mashed potatoes, grits, lactose free milk, light yogurt, applesauce,soup etc.   Keep a food diary for at least several days.

## 2018-01-13 NOTE — Progress Notes (Signed)
Medical Nutrition Therapy: Visit start time: 1000  end time: 1200  Assessment:  Diagnosis: Type 2 diabetes, IBS, obesity Past medical history: Gastric bypass surgery x2, GERD, HTN, HLD, sleep apnea, neuropathy Psychosocial issues/ stress concerns: depression due to health concerns, weight, and family health issues  Preferred learning method:  . Auditory . Visual   Current weight: 249.5lbs Height: 5'4" Medications, supplements: reconciled list in medical record  Progress and evaluation: Patient reports GI (diarrhea) issues since she was in her 31s. She has had multiple bowel movements daily, at times uncontrollable. She had gastric bypass surgery in 2001, lost over 100lbs (down to 160s), then had a second weight loss surgery in 2005 but never lost much more weight and has been gaining since. She has gained weight more rapidly since 03/2017 when she was on bedrest for several weeks prior to shoulder surgery and for some time after surgery. She has dentures and has trouble knowing when and if food is chewed enough; but also has GERD and has had esophagus stretched several times in the past. She now has episodes of foods feeling stuck and will have to work to vomit the food up. She states she often has a bowel movement while or shortly after eating and will see undigested food in the feces. Patient is not taking any multivitamins at this time, does take monthly B12 injections and daily vitamin D. She does not take Calcium but reports bone density has been tested and is normal.    Physical activity: unable at this time due to neuropathy and recent shoulder surgery.   Dietary Intake:  Usual eating pattern includes 2-3 meals and 0-1 snacks per day. Dining out frequency: 10-14 meals per week.  Breakfast: 10a-12p) sometimes none; recently microwaveable egg frittata  Snack: none Lunch: (as late as 4pm) salad, burger, plate meal ie cubed steak and veg; chicken dumplings when stomach is upset Snack:  sometimes peanut butter crackers when unable to eat a meal Supper: (often 8-10pm) leftovers from lunch; or chinese, ribs, usu meat and vegetables-- green beans, lettuce, occ mashed potatoes, stir-fried veg incl peppers, squash (stewed), tomatoes, beets Snack: none due to late supper Beverages: water, decaf sugar free tea, occ diet ginger ale  Nutrition Care Education: Topics covered: IBS, diabetes, weight management Basic nutrition: basic food groups, appropriate nutrient balance, appropriate meal and snack schedule Weight control: appropriate food portions, tracking food intake, role of physical activity; importance of adequate protein and reviewed goal for daily protein, eating protein first; discussed options for adding supplemental protein to foods, encouraged trying protein drinks to supplement daily intake as needed.  Diabetes: appropriate meal and snack schedule, appropriate carb intake and balance IBS:  Fodmap diet, importance of adequate chewing and small bites of food, encouraged chopping food before eating, choosing soft foods Other: Reviewed need for lifelong vitamin supplementation after bariatric surgery; avoiding fluids during meals   Nutritional Diagnosis:  [place]-2.1 Inpaired nutrition utilization and [place]-2.2 Altered nutrition-related laboratory As related to chronic diarrhea, history of gastric bypass surgery.  As evidenced by patient report of GI symptoms. [place]-1.4 Altered GI function As related to history of gastric bypass surgery, IBS.  As evidenced by patient report of GI symptoms and history. [place]-3.3 Overweight/obesity As related to inactivity due to neuropathy, history of excess calories.  As evidenced by patient with BMI of 42, reported diet and activity history.  Intervention: Instruction and discussion as noted above.   Set goals to consume low-fodmap foods and easy to chew foods.  Patient will plan to keep a food diary to monitor caloric intake, carbohydrate, and GI  symptoms.    Patient states she will begin taking multivitamin  Education Materials given:  . Plate Planner with food lists . Bariatric diet protein phase . Fodmap diet food lists . Goals/ instructions   Learner/ who was taught:  . Patient  . Spouse/ partner   Level of understanding: [name] Verbalizes/ demonstrates competency   Demonstrated degree of understanding via:   Teach back Learning barriers: . None  Willingness to learn/ readiness for change: . Eager, change in progress   Monitoring and Evaluation:  Dietary intake, exercise, GI symptoms, BG control, and body weight      follow up: 02/28/18

## 2018-01-13 NOTE — Progress Notes (Signed)
Complaint:  Visit Type: Patient returns to my office for continued preventative foot care services. Complaint: Patient states" my nails have grown long and thick and become painful to walk and wear shoes" Patient has been diagnosed with DM with no foot complications. The patient presents for preventative foot care services. No changes to ROS.  Patient says she has  no feeling in her feet.   Podiatric Exam: Vascular: dorsalis pedis and posterior tibial pulses are palpable bilateral. Capillary return is immediate. Temperature gradient is WNL. Skin turgor WNL  Sensorium: Absent  Semmes Weinstein monofilament test. Normal tactile sensation bilaterally. Nail Exam: Pt has thick disfigured discolored nails with subungual debris noted bilateral entire nail hallux through fifth toenails Ulcer Exam: There is no evidence of ulcer or pre-ulcerative changes or infection. Orthopedic Exam: Muscle tone and strength are WNL. No limitations in general ROM. No crepitus or effusions noted. Foot type and digits show no abnormalities. Bony prominences are unremarkable. 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  Overlapping second digit left foot. Skin: No Porokeratosis. No infection or ulcers  Diagnosis:  Onychomycosis, , Pain in right toe, pain in left toes Diabetes with neuropathy  Treatment & Plan Procedures and Treatment: Consent by patient was obtained for treatment procedures. The patient understood the discussion of treatment and procedures well. All questions were answered thoroughly reviewed. Debridement of mycotic and hypertrophic toenails, 1 through 5 bilateral and clearing of subungual debris. No ulceration, no infection noted.  Return Visit-Office Procedure: Patient instructed to return to the office for a follow up visit 3 months for continued evaluation and treatment.    Gardiner Barefoot DPM

## 2018-01-24 ENCOUNTER — Ambulatory Visit (INDEPENDENT_AMBULATORY_CARE_PROVIDER_SITE_OTHER): Payer: Medicare Other | Admitting: Obstetrics & Gynecology

## 2018-01-24 ENCOUNTER — Encounter: Payer: Self-pay | Admitting: Obstetrics & Gynecology

## 2018-01-24 VITALS — BP 136/82 | HR 87 | Ht 64.0 in | Wt 250.0 lb

## 2018-01-24 DIAGNOSIS — N8110 Cystocele, unspecified: Secondary | ICD-10-CM

## 2018-01-24 DIAGNOSIS — N3941 Urge incontinence: Secondary | ICD-10-CM | POA: Diagnosis not present

## 2018-01-24 DIAGNOSIS — N816 Rectocele: Secondary | ICD-10-CM

## 2018-01-24 DIAGNOSIS — N763 Subacute and chronic vulvitis: Secondary | ICD-10-CM

## 2018-01-24 MED ORDER — CLOTRIMAZOLE-BETAMETHASONE 1-0.05 % EX CREA: 1.0000 "application " | TOPICAL_CREAM | Freq: Two times a day (BID) | CUTANEOUS | 3 refills | Status: DC

## 2018-01-24 NOTE — Patient Instructions (Signed)
Betamethasone; Clotrimazole skin cream What is this medicine? BETAMETHASONE; CLOTRIMAZOLE (bay ta METH a sone; kloe TRIM a zole) is a corticosteroid and antifungal cream. It treats ringworm and infections like jock itch and athlete's foot. It also helps reduce swelling, redness, and itching caused by these infections. This medicine may be used for other purposes; ask your health care provider or pharmacist if you have questions. COMMON BRAND NAME(S): Lotrisone What should I tell my health care provider before I take this medicine? They need to know if you have any of these conditions: -large areas of burned or damaged skin -skin thinning -peripheral vascular disease or poor circulation -an unusual or allergic reaction to betamethasone, clotrimazole, other corticosteroids, other antifungals, other medicines, foods, dyes, or preservatives -pregnant or trying to get pregnant -breast-feeding How should I use this medicine? This cream is for external use only. Do not take by mouth. Follow the directions on the prescription label. Wash your hands before and after use. If treating hand or nail infections, wash hands before use only. Apply a thin layer of cream to the affected area and rub in gently. Do not cover or wrap the treated area with an airtight bandage (like a plastic bandage). Use the cream for the full course of treatment prescribed, even if you think the condition is getting better. Use the medicine at regular intervals. Do not use more often than directed. Do not use on healthy skin or over large areas of skin. Do not use this medicine for any condition other than the one for which it was prescribed. When applying to the groin area, apply a small amount and do not use for longer than 2 weeks unless directed to by your doctor or health care professional. Do not get this cream in your eyes. If you do, rinse out with plenty of cool tap water. Talk to your pediatrician regarding the use of this  medicine in children. While this drug may be prescribed for children as young as 17 years for selected conditions, precautions do apply. Patients over 24 years old may have a stronger reaction and need a smaller dose. Overdosage: If you think you have taken too much of this medicine contact a poison control center or emergency room at once. NOTE: This medicine is only for you. Do not share this medicine with others. What if I miss a dose? If you miss a dose, use it as soon as you can. If it is almost time for your next dose, use only that dose. Do not use double or take extra doses. What may interact with this medicine? -topical products that have nystatin This list may not describe all possible interactions. Give your health care provider a list of all the medicines, herbs, non-prescription drugs, or dietary supplements you use. Also tell them if you smoke, drink alcohol, or use illegal drugs. Some items may interact with your medicine. What should I watch for while using this medicine? If using this medicine on your body or groin tell your doctor or health care professional if your symptoms do not improve within 1 week. If using this medicine on your feet tell your doctor or health care professional if your symptoms do not improve within 2 weeks. Tell your doctor if your skin infection returns after you stop using this cream. If you are using this cream for 'jock itch' be sure to dry the groin completely after bathing. Do not wear underwear that is tight-fitting or made from synthetic fibers like rayon or  nylon. Wear loose-fitting, cotton underwear. If you are using this cream for athlete's foot be sure to dry your feet carefully after bathing, especially between the toes. Do not wear socks made from wool or synthetic materials like rayon or nylon. Wear clean cotton socks and change them at least once a day, change them more if your feet sweat a lot. Also, try to wear sandals or shoes that are  well-ventilated. Do not use this cream to treat diaper rash. What side effects may I notice from receiving this medicine? Side effects that you should report to your doctor or health care professional as soon as possible: -allergic reactions like skin rash, itching or hives, swelling of the face, lips, or tongue -dark red spots on the skin -lack of healing of skin condition -loss of feeling on skin -painful, red, pus-filled blisters in hair follicles -skin infection -sores or blisters that do not heal properly -thinning of the skin or sunburn Side effects that usually do not require medical attention (report to your doctor or health care professional if they continue or are bothersome): -dry or peeling skin -minor skin irritation, burning, or itching This list may not describe all possible side effects. Call your doctor for medical advice about side effects. You may report side effects to FDA at 1-800-FDA-1088. Where should I keep my medicine? Keep out of the reach of children. Store at room temperature between 15 and 30 degrees C ( 59 and 86 degrees F). Do not freeze. Throw away any unused medicine after the expiration date. NOTE: This sheet is a summary. It may not cover all possible information. If you have questions about this medicine, talk to your doctor, pharmacist, or health care provider.  2018 [employer]vier/Gold Standard (2007-10-01 16:14:28)

## 2018-01-24 NOTE — Progress Notes (Signed)
Cystocele/Rectocele Patient complains of a vaginal pressure 3 weeks ago, all of a sudden with pressure not pain and feeling like something was coming out.  No radiation, no modifers but it did improve on its own over the next 2 weeks, so now is asymptomatic.  No bleeding.  Pt has chronic yeast infections and has discharge and irritation on occasions. Pt has had chronic fecal incontinence and has seen GI for this among others, no real solution found.  Pt has had urge incontinence that is currently very well treated w Interstim, sees urology for that.  No recent change in urinary sx's at the time of this vag pressure event.  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 Also,  has a past surgical history that includes Gastric bypass; Abdominal hysterectomy; Gastric bypass (2000, 2005); Appendectomy; Gallbladder surgery (1976); Rotator cuff repair; Joint replacement (2007); Spine surgery (1995); Interstim Implant placement (April 2013); Gallbladder surgery (resection); Total abdominal hysterectomy w/ bilateral salpingoophorectomy (1974); Umbilical hernia repair (Aug 11, 2015); Diaphragmatic hernia repair (2015); Colonoscopy; Esophagogastroduodenoscopy; Esophageal dilation; and Reverse shoulder arthroplasty (Right, 08/13/2017)., family history includes Depression in her  mother; Diabetes in her daughter; Headache in her mother; Heart disease in her daughter, father, and mother; Hypertension in her daughter, father, mother, and son; Osteoporosis in her father; Prostate cancer in her father; Stroke in her father and mother; Thyroid disease in her mother.,  reports that she has never smoked. She has never used smokeless tobacco. She reports that she does not drink alcohol or use drugs.  She has a current medication list which includes the following prescription(s): albuterol, aspirin ec, benzonatate, onetouch verio, butalbital-acetaminophen-caffeine, citalopram, cyanocobalamin, diazepam, dicyclomine, dulaglutide, eszopiclone, glucose blood, one touch delica lancing dev, levetiracetam, NONFORMULARY OR COMPOUNDED ITEM, ondansetron, onetouch delica lancets fine, pregabalin, rosuvastatin, syringe 3cc/25gx1", tramadol, clotrimazole-betamethasone, and nystatin. Also, is allergic to biaxin [clarithromycin]; amoxicillin; demeclocycline; erythromycin; flagyl [metronidazole]; glucophage [metformin hcl]; tetracyclines & related; diovan [valsartan]; sulfa antibiotics; and xanax [alprazolam].  Review of Systems  Constitutional: Positive for malaise/fatigue. Negative for chills and fever.  HENT: Negative for congestion, sinus pain and sore throat.   Eyes: Negative for blurred vision and pain.  Respiratory: Positive for shortness of breath. Negative for cough and wheezing.   Cardiovascular: Negative for chest pain and leg swelling.  Gastrointestinal: Positive for nausea. Negative for abdominal pain, constipation, diarrhea, heartburn and vomiting.  Genitourinary: Negative for dysuria, frequency, hematuria and urgency.  Musculoskeletal: Positive for joint pain. Negative for back pain, myalgias and neck pain.  Skin: Positive for rash. Negative for itching.  Neurological: Positive for dizziness, weakness and headaches. Negative for tremors.  Endo/Heme/Allergies: Does not bruise/bleed  easily.  Psychiatric/Behavioral: Positive for depression. The patient is not nervous/anxious and does not have insomnia.    Objective: BP 136/82   Pulse 87   Ht 5\' 4"  (1.626 m)   Wt 250 lb (113.4 kg)   BMI 42.91 kg/m  Physical Exam  Constitutional: She is oriented to person, place, and time. She appears well-developed and well-nourished. No distress.  Genitourinary: Rectum normal and vagina normal. Pelvic exam was performed with patient supine.  There is rash on the right labia. There is no lesion on the right labia.  There is rash on the left labia. There is no lesion on the left labia. Vagina exhibits no lesion. No bleeding in the vagina. Right adnexum does not display mass and does not display tenderness. Left adnexum does not display mass and does not display tenderness.  Genitourinary Comments: Absent Uterus Absent cervix Vaginal cuff well healed Min cystocele Gr 2 rectocele Absent perineal body  HENT:  Head: Normocephalic and atraumatic. Head is without laceration.  Right Ear: Hearing normal.  Left Ear: Hearing normal.  Nose: No epistaxis.  No foreign bodies.  Mouth/Throat: Uvula is midline, oropharynx is clear and moist and mucous membranes are normal.  Eyes: Pupils are equal, round, and reactive to light.  Neck: Normal range of motion. Neck supple. No thyromegaly present.  Cardiovascular: Normal rate and regular rhythm. Exam reveals no gallop and no friction rub.  No murmur heard. Pulmonary/Chest: Effort normal and breath sounds normal. No respiratory distress. She has no wheezes.  Abdominal: Soft. Bowel sounds are normal. She exhibits no distension. There is no tenderness. There is no rebound.  Musculoskeletal: Normal range of motion.  Neurological: She is alert and oriented to person, place, and time. No cranial nerve deficit.  Skin: Skin is warm and dry.  Erythema and irritated skin in vulva, thigh folds (c/w yeast rash)  Psychiatric: She has a normal mood and affect.  Judgment normal.  Vitals reviewed.  ASSESSMENT/PLAN:   Problem List Items Addressed This Visit      Digestive   Rectocele     Genitourinary   Vaginal prolapse - Primary     Other   Urge incontinence    Other Visit Diagnoses    Chronic vulvitis        Lotrisone for vulvar irritation No tx advice for chronic rectocele and rectal incontinence.  Cont to see GI. No sig cystocele and vag prolapse is mild.  Not a candidate for pessary. Incontinence well treated w Interstim  Barnett Applebaum, MD, [facility], [facility] 01/24/2018  11:07 AM

## 2018-01-28 ENCOUNTER — Other Ambulatory Visit: Payer: Self-pay | Admitting: Internal Medicine

## 2018-01-29 ENCOUNTER — Encounter: Payer: Self-pay | Admitting: Internal Medicine

## 2018-01-29 ENCOUNTER — Ambulatory Visit (INDEPENDENT_AMBULATORY_CARE_PROVIDER_SITE_OTHER): Payer: Medicare Other | Admitting: Internal Medicine

## 2018-01-29 VITALS — BP 104/68 | HR 80 | Temp 98.8°F | Resp 15 | Ht 64.0 in | Wt 252.2 lb

## 2018-01-29 DIAGNOSIS — F411 Generalized anxiety disorder: Secondary | ICD-10-CM | POA: Diagnosis not present

## 2018-01-29 DIAGNOSIS — R296 Repeated falls: Secondary | ICD-10-CM

## 2018-01-29 DIAGNOSIS — R635 Abnormal weight gain: Secondary | ICD-10-CM | POA: Diagnosis not present

## 2018-01-29 DIAGNOSIS — E662 Morbid (severe) obesity with alveolar hypoventilation: Secondary | ICD-10-CM

## 2018-01-29 DIAGNOSIS — E1142 Type 2 diabetes mellitus with diabetic polyneuropathy: Secondary | ICD-10-CM

## 2018-01-29 MED ORDER — DULAGLUTIDE 1.5 MG/0.5ML ~~LOC~~ SOAJ
1.5000 mg | Freq: Every day | SUBCUTANEOUS | 3 refills | Status: DC
Start: 2018-01-29 — End: 2018-04-28

## 2018-01-29 MED ORDER — ROSUVASTATIN CALCIUM 10 MG PO TABS: 10.0000 mg | ORAL_TABLET | Freq: Every day | ORAL | 3 refills | Status: DC

## 2018-01-29 MED ORDER — DULAGLUTIDE 0.75 MG/0.5ML ~~LOC~~ SOAJ: 0.7500 mg | SUBCUTANEOUS | 3 refills | Status: DC

## 2018-01-29 MED ORDER — CITALOPRAM HYDROBROMIDE 10 MG PO TABS: 10.0000 mg | ORAL_TABLET | Freq: Every day | ORAL | 3 refills | Status: DC

## 2018-01-29 NOTE — Progress Notes (Signed)
Subjective:  Patient ID: Denise Sharp, female    DOB: 08-23-1945  Age: 72 y.o. MRN: [id number]  CC: The primary encounter diagnosis was DM type 2 with diabetic peripheral neuropathy ([place]). Diagnoses of Weight gain, Obesity hypoventilation syndrome ([place]), Frequent falls, and GAD (generalized anxiety disorder) were also pertinent to this visit.  HPI Denise Sharp presents for FOLLOW UP on chronic conditions including morbid obesity  Type 2 DM and recent diagnosis of GAD/depression with pharmacotherapy initiated at last visit.   She was recently evaluated by GYN for symptomatic vaginal prolapse and given lotrisone for  Vulvar irritation .  Not a candidate for pessary   Saw Podiatry for diabetic foot exam  GAD/depression:  She has been taking citalopram.  Tolerating it and feeling better despite a additional  emotional stressor due to the the unexpected  death of a young lady she regarded as a daughter    She reports that she recently fell out of her motorized recliner, and because her husband was unable to hear her call from the other room,  She had to call EMS  And 3  firemen were needed to pick her upfro the floor,  She was not hurt,  Only embarassed.    Has gained 2 lbs by our scales ,  But has lost 6 lbs by her home scales . [name]  Not exercising yet but plans to bring home her stationery bike from her daughter's house. discussed  How to exercise, using a stationery bike.    Type 2 DM uncontrolled:  She met with PharmD Deirdre Pippins one month ago for 2 week follow up on medication changes including startig Trulicity at the 2.62 mg weekly dose. .  She states that her   cbg's   have been  140 to 150 fasting  Lab Results  Component Value Date   HGBA1C 9.1 (H) 11/19/2017         Outpatient Medications Prior to Visit  Medication Sig Dispense Refill  . albuterol (PROVENTIL HFA;VENTOLIN HFA) 108 (90 Base) MCG/ACT inhaler Inhale 2 puffs into the lungs every 8 (eight) hours as needed  for wheezing or shortness of breath. 1 Inhaler 0  . aspirin EC 81 MG tablet Take 1 tablet (81 mg total) by mouth daily. 90 tablet 3  . benzonatate (TESSALON) 200 MG capsule Take 1 capsule (200 mg total) by mouth 2 (two) times daily as needed for cough. 30 capsule 0  . Blood Glucose Monitoring Suppl (ONETOUCH VERIO) w/Device KIT 1 Device by Does not apply route daily. Use to test blood glucose once daily. Dx code = E11.9 1 kit 0  . butalbital-acetaminophen-caffeine (FIORICET, ESGIC) 50-325-40 MG tablet Take 1 tablet by mouth every 6 (six) hours as needed for headache. 60 tablet 0  . clotrimazole-betamethasone (LOTRISONE) cream Apply 1 application topically 2 (two) times daily. 45 g 3  . cyanocobalamin (,VITAMIN B-12,) 1000 MCG/ML injection Inject 1 ml (1000 mcg ) IM weekly x 4,  Then monthly thereafter (Patient taking differently: Inject 1 ml (1000 mcg ) IM monthly) 110 mL 0  . diazepam (VALIUM) 5 MG tablet Take 1 tablet (5 mg total) by mouth at bedtime as needed for anxiety. Related to CPAP use 30 tablet 5  . dicyclomine (BENTYL) 10 MG capsule Take 10 mg by mouth 4 (four) times daily -  before meals and at bedtime.    . Eszopiclone 3 MG TABS Take 1 tablet (3 mg total) by mouth at bedtime. Take immediately before  bedtime 90 tablet 0  . glucose blood test strip Use to test blood sugar once daily. Dx code = E11.9 100 each 12  . Lancet Devices (ONE TOUCH DELICA LANCING DEV) MISC Use to test blood glucose once daily. Dx code = E11.9 1 each 0  . levETIRAcetam (KEPPRA) 500 MG tablet Take 1 tablet (500 mg total) by mouth 2 (two) times daily. (Patient taking differently: Take 500 mg by mouth at bedtime. ) 180 tablet 1  . NONFORMULARY OR COMPOUNDED [place]  Combination Pain Cream -  Baclofen 2%, Doxepin 5%, Gabapentin 6%, Topiramate 2%, Pentoxifylline 3% Apply 1-2 grams to affected area 3-4 times daily Qty. 120 gm 3 refills 1 each 3  . nystatin (NYAMYC) powder Apply topically 4 (four) times  daily.    . ondansetron (ZOFRAN) 4 MG tablet Take 1 tablet (4 mg total) by mouth every 8 (eight) hours as needed for nausea or vomiting. 10 tablet 0  . ONETOUCH DELICA LANCETS FINE MISC Use to test blood sguar once daily. Dx code = E11.9 100 each 3  . pregabalin (LYRICA) 100 MG capsule Take 100 mg by mouth daily.    . Syringe/Needle, Disp, (SYRINGE 3CC/25GX1") 25G X 1" 3 ML MISC Use for b12 injections 50 each 0  . traMADol (ULTRAM) 50 MG tablet Take 0.5 tablets (25 mg total) by mouth every 12 (twelve) hours as needed for severe pain. 30 tablet 0  . citalopram (CELEXA) 10 MG tablet TAKE 1 TABLET BY MOUTH EVERY DAY 30 tablet 2  . Dulaglutide (TRULICITY) 8.18 HU/3.1SH SOPN Inject 0.75 mg into the skin once a week. 12 pen 3  . rosuvastatin (CRESTOR) 10 MG tablet Take 1 tablet (10 mg total) by mouth daily. 90 tablet 3   No facility-administered medications prior to visit.     Review of Systems;  Patient denies headache, fevers, malaise, unintentional weight loss, skin rash, eye pain, sinus congestion and sinus pain, sore throat, dysphagia,  hemoptysis , cough, dyspnea, wheezing, chest pain, palpitations, orthopnea, edema, abdominal pain, nausea, melena, diarrhea, constipation, flank pain, dysuria, hematuria, urinary  Frequency, nocturia, numbness, tingling, seizures,  Focal weakness, Loss of consciousness,  Tremor, insomnia, depression, anxiety, and suicidal ideation.      Objective:  BP 104/68 (BP Location: Left Arm, Patient Position: Sitting, Cuff Size: Normal)   Pulse 80   Temp 98.8 F (37.1 C) (Oral)   Resp 15   Ht '5\' 4"'  (1.626 m)   Wt 252 lb 3.2 oz (114.4 kg)   SpO2 92%   BMI 43.29 kg/m   BP Readings from Last 3 Encounters:  01/29/18 104/68  01/24/18 136/82  01/01/18 126/72    Wt Readings from Last 3 Encounters:  01/29/18 252 lb 3.2 oz (114.4 kg)  01/24/18 250 lb (113.4 kg)  01/13/18 249 lb 8 oz (113.2 kg)    General appearance: alert, cooperative and appears stated  age Ears: normal TM's and external ear canals both ears Throat: lips, mucosa, and tongue normal; teeth and gums normal Neck: no adenopathy, no carotid bruit, supple, symmetrical, trachea midline and thyroid not enlarged, symmetric, no tenderness/mass/nodules Back: symmetric, no curvature. ROM normal. No CVA tenderness. Lungs: clear to auscultation bilaterally Heart: regular rate and rhythm, S1, S2 normal, no murmur, click, rub or gallop Abdomen: soft, non-tender; bowel sounds normal; no masses,  no organomegaly Pulses: 2+ and symmetric Skin: Skin color, texture, turgor normal. No rashes or lesions Lymph nodes: Cervical, supraclavicular, and axillary nodes normal.  Lab Results  Component Value Date   HGBA1C 9.1 (H) 11/19/2017   HGBA1C 7.6 (H) 07/30/2017   HGBA1C 8.1 (H) 06/04/2017    Lab Results  Component Value Date   CREATININE 0.82 11/19/2017   CREATININE 0.87 09/19/2017   CREATININE 0.98 08/21/2017    Lab Results  Component Value Date   WBC 10.3 11/19/2017   HGB 11.9 (L) 11/19/2017   HCT 36.7 11/19/2017   PLT 286.0 11/19/2017   GLUCOSE 242 (H) 11/19/2017   CHOL 183 06/04/2017   TRIG 195.0 (H) 06/04/2017   HDL 48.50 06/04/2017   LDLDIRECT 111.0 06/16/2015   LDLCALC 96 06/04/2017   ALT 21 11/19/2017   AST 23 11/19/2017   NA 139 11/19/2017   K 3.7 11/19/2017   CL 103 11/19/2017   CREATININE 0.82 11/19/2017   BUN 14 11/19/2017   CO2 26 11/19/2017   TSH 2.279 08/20/2017   HGBA1C 9.1 (H) 11/19/2017   MICROALBUR 2.8 (H) 06/04/2017    Dg Shoulder Right  Result Date: 08/20/2017 CLINICAL DATA:  Right shoulder pain.  Fall EXAM: RIGHT SHOULDER - 2+ VIEW COMPARISON:  08/13/2017 FINDINGS: Prior right shoulder replacement. No hardware complicating feature or acute bony abnormality. No fracture, subluxation or dislocation. IMPRESSION: Prior right shoulder replacement.  No acute findings. Electronically Signed   By: Rolm Baptise M.D.   On: 08/20/2017 11:13   Ct Head Wo  Contrast  Result Date: 08/19/2017 CLINICAL DATA:  Sudden mental status changes, currently unresponsive. EXAM: CT HEAD WITHOUT CONTRAST TECHNIQUE: Contiguous axial images were obtained from the base of the skull through the vertex without intravenous contrast. COMPARISON:  02/17/2017 FINDINGS: Brain: Age related atrophy. Chronic appearing small vessel ischemic changes the hemispheric white matter. No sign of acute infarction, mass lesion, hemorrhage, hydrocephalus or extra-axial collection. Vascular: There is atherosclerotic calcification of the major vessels at the base of the brain. Skull: Normal Sinuses/Orbits: Clear/normal Other: None IMPRESSION: No acute finding by CT. Age related atrophy and mild chronic small-vessel ischemic change of the white matter. Electronically Signed   By: Nelson Chimes M.D.   On: 08/19/2017 20:41   Ct Angio Chest Pe W Or Wo Contrast  Result Date: 08/20/2017 CLINICAL DATA:  Elevated D-dimer.  Recent shoulder surgery. EXAM: CT ANGIOGRAPHY CHEST WITH CONTRAST TECHNIQUE: Multidetector CT imaging of the chest was performed using the standard protocol during bolus administration of intravenous contrast. Multiplanar CT image reconstructions and MIPs were obtained to evaluate the vascular anatomy. CONTRAST:  176m ISOVUE-370 IOPAMIDOL (ISOVUE-370) INJECTION 76% COMPARISON:  Chest radiograph, 08/19/2017.  Chest CTA, 12/07/2015. FINDINGS: Cardiovascular: Satisfactory opacification of the pulmonary arteries to the segmental level. No evidence of pulmonary embolism. Normal heart size. No pericardial effusion. Mild coronary artery calcifications. Great vessels normal in caliber. Aorta is not opacified. No aortic atherosclerotic calcifications. Mediastinum/Nodes: Visualized thyroid is unremarkable. No mediastinal or hilar masses. No pathologically enlarged lymph nodes. Trachea is widely patent. Esophagus is unremarkable. Lungs/Pleura: There is right lower lobe opacity most consistent with  atelectasis. Minimal subsegmental atelectasis noted in the dependent left lower lobe. Lungs otherwise clear. Trace right pleural effusion. No left pleural effusion. No pneumothorax. Upper Abdomen: Changes from prior gastric surgery stable from the prior CT. 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 No acute findings in the visible upper abdomen. Musculoskeletal: There is edema surrounding the new right shoulder prosthesis. No acute fracture. No osteoblastic or osteolytic lesions. Review of the MIP images confirms the above findings. IMPRESSION: 1. No evidence of a pulmonary  embolism. 2. Right lower lobe atelectasis and minimal subsegmental left lower lobe atelectasis. No convincing pneumonia. No pulmonary edema. Trace right pleural effusion. Electronically Signed   By: Lajean Manes M.D.   On: 08/20/2017 17:19   Dg Chest Portable 1 View  Result Date: 08/19/2017 CLINICAL DATA:  Recent shoulder surgery. Altered mental status today. EXAM: PORTABLE CHEST 1 VIEW COMPARISON:  06/13/2017. FINDINGS: Poor inspiration. Allowing for that, the heart and mediastinum are normal in the lungs are clear. No collapse or visible infiltrate. Reverse shoulder replacement on the right. IMPRESSION: Poor inspiration.  No active disease suspected. Electronically Signed   By: Nelson Chimes M.D.   On: 08/19/2017 20:35    Assessment & Plan:   Problem List Items Addressed This Visit    Weight gain    Persistent,  Despite a reported lack of appetite,  Due to lack of any exercise.  Addressed today       Obesity hypoventilation syndrome ([place])    Encouraged to be as active as possible.        Relevant Medications   Dulaglutide (TRULICITY) 1.5 GU/4.4IH SOPN   GAD (generalized anxiety disorder)    Improved outlook and symptoms with addition of Celexa.  No changes today      Relevant Medications   citalopram (CELEXA) 10 MG tablet   Frequent falls    Secondary to daytime hypersomnolence due to OHS ,   Abnormal gait secondary to neuropathy , and physical deconditioning resulting in lack of core strength.  Her recent fall  out of her motorized recliner did not result in an injury  Really no way to prevent some of these until patient becomes more physically fit.      DM type 2 with diabetic peripheral neuropathy ([place]) - Primary    Uncontrolled by last a1c.  Trulicity started,  will increase dose to 1.5 mg today . Follow up with new Pharm D in 1 month  Lab Results  Component Value Date   HGBA1C 9.1 (H) 11/19/2017          Relevant Medications   citalopram (CELEXA) 10 MG tablet   rosuvastatin (CRESTOR) 10 MG tablet   Dulaglutide (TRULICITY) 1.5 KV/4.2VZ SOPN   Other Relevant Orders   Hemoglobin A1c   Comprehensive metabolic panel   Lipid panel    A total of 25 minutes of face to face time was spent with patient more than half of which was spent in counselling about the above mentioned conditions  and coordination of care   I have discontinued Denise Sharp's Dulaglutide and Dulaglutide. I have also changed her citalopram. Additionally, I am having her start on Dulaglutide. Lastly, I am having her maintain her albuterol, butalbital-acetaminophen-caffeine, aspirin EC, ondansetron, traMADol, levETIRAcetam, nystatin, pregabalin, cyanocobalamin, SYRINGE 3CC/25GX1", NONFORMULARY OR COMPOUNDED ITEM, benzonatate, glucose blood, ONE TOUCH DELICA LANCING DEV, ONETOUCH DELICA LANCETS FINE, ONETOUCH VERIO, diazepam, Eszopiclone, dicyclomine, clotrimazole-betamethasone, and rosuvastatin.  Meds ordered this encounter  Medications  . citalopram (CELEXA) 10 MG tablet    Sig: Take 1 tablet (10 mg total) by mouth daily.    Dispense:  90 tablet    Refill:  3  . rosuvastatin (CRESTOR) 10 MG tablet    Sig: Take 1 tablet (10 mg total) by mouth daily.    Dispense:  90 tablet    Refill:  3  . DISCONTD: Dulaglutide (TRULICITY) 5.63 OV/5.6EP SOPN    Sig: Inject 0.75 mg into the skin once a week.     Dispense:  12 pen    Refill:  3    D/c januvia and glipizide  . Dulaglutide (TRULICITY) 1.5 IZ/1.2WP SOPN    Sig: Inject 1.5 mg into the skin daily.    Dispense:  12 pen    Refill:  3    DISREGARD PREVIOUS REFILL AT LOWER DOSE,  NOTE DOSE INCREASE    Medications Discontinued During This Encounter  Medication Reason  . citalopram (CELEXA) 10 MG tablet Reorder  . rosuvastatin (CRESTOR) 10 MG tablet Reorder  . Dulaglutide (TRULICITY) 8.09 XI/3.3AS SOPN Reorder  . Dulaglutide (TRULICITY) 5.05 LZ/7.6BH SOPN     Follow-up: Return in about 3 months (around 05/01/2018) for DEPRESSION.   [name], MD

## 2018-01-29 NOTE — Patient Instructions (Addendum)
I HAVE INCREASED YOUR TRULICITY DOSE TO 1.5 MG    CONTINUE CITALOPRAM  DAILY FOR YOUR MOOD   MAKE AN APPT WITH OUR NEW PHARMACIST WHEN YOU LEAVE   YOU CANNOT GET LABS UNTIL AUGUST 8 SO MAKE APPT FOR LABS AND VISIT WITH HE RON OR AFTER AUGUST 8

## 2018-02-01 DIAGNOSIS — F411 Generalized anxiety disorder: Secondary | ICD-10-CM | POA: Insufficient documentation

## 2018-02-01 NOTE — Assessment & Plan Note (Signed)
Encouraged to be as active as possible...

## 2018-02-01 NOTE — Assessment & Plan Note (Signed)
Uncontrolled by last a1c.  Trulicity started,  will increase dose to 1.5 mg today . Follow up with new Pharm D in 1 month  Lab Results  Component Value Date   HGBA1C 9.1 (H) 11/19/2017

## 2018-02-01 NOTE — Assessment & Plan Note (Signed)
Persistent,  Despite a reported lack of appetite,  Due to lack of any exercise.  Addressed today

## 2018-02-01 NOTE — Assessment & Plan Note (Addendum)
Secondary to daytime hypersomnolence due to OHS ,  Abnormal gait secondary to neuropathy , and physical deconditioning resulting in lack of core strength.  Her recent fall  out of her motorized recliner did not result in an injury  Really no way to prevent some of these until patient becomes more physically fit.

## 2018-02-01 NOTE — Assessment & Plan Note (Signed)
Improved outlook and symptoms with addition of Celexa.  No changes today

## 2018-02-05 ENCOUNTER — Encounter: Payer: Self-pay | Admitting: Neurology

## 2018-02-05 ENCOUNTER — Ambulatory Visit (INDEPENDENT_AMBULATORY_CARE_PROVIDER_SITE_OTHER): Payer: Medicare Other | Admitting: Neurology

## 2018-02-05 VITALS — BP 131/80 | HR 73 | Ht 64.0 in | Wt 250.5 lb

## 2018-02-05 DIAGNOSIS — E1142 Type 2 diabetes mellitus with diabetic polyneuropathy: Secondary | ICD-10-CM

## 2018-02-05 DIAGNOSIS — R5382 Chronic fatigue, unspecified: Secondary | ICD-10-CM | POA: Diagnosis not present

## 2018-02-05 DIAGNOSIS — D72829 Elevated white blood cell count, unspecified: Secondary | ICD-10-CM | POA: Diagnosis not present

## 2018-02-05 DIAGNOSIS — Z9884 Bariatric surgery status: Secondary | ICD-10-CM | POA: Diagnosis not present

## 2018-02-05 DIAGNOSIS — E612 Magnesium deficiency: Secondary | ICD-10-CM | POA: Insufficient documentation

## 2018-02-05 MED ORDER — LEVETIRACETAM 500 MG PO TABS: 500.0000 mg | ORAL_TABLET | Freq: Every day | ORAL | Status: DC

## 2018-02-05 NOTE — Progress Notes (Signed)
Reason for visit: Peripheral neuropathy  Denise Sharp is an 72 y.o. female  History of present illness:  Denise Sharp is a 72 year old right-handed white female with a history of a peripheral neuropathy associated with diabetes.  The patient has been relatively inactive over the last year, she was in the hospital on 19 August 2017 with what she believes was a drug overdose.  The patient was somnolent, difficult to arouse.  She believes that since that time her memory has not returned to normal.  A CT scan of the brain was done around that time showing a moderate level small vessel ischemic changes, no acute changes were seen.  The patient has gait instability, she will fall on occasion.  She uses a cane for ambulation.  She does have a walker but does not use it frequently.  She has completed physical therapy recently.  She cannot have MRI of the brain given a InterStim stimulator placement previously.  She is under good control with her neuropathy pain, she has cut back on the Keppra taking 500 mg daily.  She has Lyrica to take on an as-needed basis.  She in general does not sleep well but not because of discomfort.  She has daytime drowsiness.  She is on CPAP for sleep apnea.  She has had some chronic issues with dizziness.  She returns to this office for an evaluation.  She recent was found to have a low B12 level of 203, she is on B12 injections.  Past Medical History:  Diagnosis Date  . Abnormality of gait 03/25/2013  . Adrenal mass, left ([place])   . Anemia    iron deficient post  2 unit txfsn 2009, normal endo/colonoscopy by [facility]  . Arthritis   . Atypical chest pain    a. 04/1986 Cath ([facility]): nl cors, EF 65%; b. 07/2012 Cath [facility]): LM nl, LAD/LCX/RCA w/ min irregs; c. 07/2016 [facility]): "Equivocal" w/ small inf wall rev defect and hyperdynamic LV contraction, EF 87%; d. 08/2016 Cardiac CT Ca2+ score [facility]): Ca2+ score 1548.  [name] Cervical spinal stenosis 1994   due to trauma to back  ([employer] accident), has intermittent paralysis and parasthesias  . Cervicogenic headache 03/23/2014  . Depression   . Dizziness    chronic dizziness  . DJD (degenerative joint disease)    a. Chronic R shoulder pain pending R shoulder replacement 07/2017.  [name] Esophageal stenosis March 2011   with transietn outlet obstruction by food, cleared by EGD   . Family history of adverse reaction to anesthesia    daughter PONV  . Gastric bypass status for obesity   . Headache(784.0)   . Hypertension   . IBS (irritable bowel syndrome)   . Left bundle branch block (LBBB)    a. Intermittently present - likely rate related. - patient denies  . Obesity   . Obstructive sleep apnea    using CPAP  . 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 Restless leg syndrome   . Rotator cuff arthropathy, right 08/13/2017  . Syncope and collapse 03/12/2014  . Type II diabetes mellitus ([place])     Past Surgical History:  Procedure Laterality Date  . ABDOMINAL HYSTERECTOMY    . APPENDECTOMY    . COLONOSCOPY    . DIAPHRAGMATIC HERNIA REPAIR  2015  . ESOPHAGEAL DILATION     multiple  . ESOPHAGOGASTRODUODENOSCOPY    . [place]  . GALLBLADDER SURGERY  resection  . GASTRIC BYPASS    .  GASTRIC BYPASS  2000, 2005   Dr. Debroah Loop  . [facility] IMPLANT PLACEMENT  April 2013   [place]  . JOINT REPLACEMENT  2007   bilateral knee. [name],  [name]  . REVERSE SHOULDER ARTHROPLASTY Right 08/13/2017   Procedure: REVERSE RIGHT SHOULDER ARTHROPLASTY;  Surgeon: [name], MD;  Location: [place];  Service: Orthopedics;  Laterality: Right;  . ROTATOR CUFF REPAIR     right  . [place]  . TOTAL ABDOMINAL HYSTERECTOMY W/ BILATERAL SALPINGOOPHORECTOMY  1974  . UMBILICAL HERNIA REPAIR  Aug 11, 2015    Family History  Problem Relation Age of Onset  . Heart disease Father   . Hypertension Father   . Prostate cancer Father   . Stroke Father   . Osteoporosis Father   . Stroke Mother   .  Depression Mother   . Headache Mother   . Heart disease Mother   . Thyroid disease Mother   . Hypertension Mother   . Diabetes Daughter   . Heart disease Daughter   . Hypertension Daughter   . Hypertension Son     Social history:  reports that she has never smoked. She has never used smokeless tobacco. She reports that she does not drink alcohol or use drugs.    Allergies  Allergen Reactions  . Biaxin [Clarithromycin] Nausea And Vomiting and Other (See Comments)    Severe irritable bowel  . Amoxicillin Diarrhea and Other (See Comments)    .PATIENT HAS HAD A PCN REACTION WITH IMMEDIATE RASH, FACIAL/TONGUE/THROAT SWELLING, SOB, OR LIGHTHEADEDNESS WITH HYPOTENSION:  #  #  #  YES  #  #  #   Has patient had a PCN reaction causing severe rash involving mucus membranes or skin necrosis: No Has patient had a PCN reaction that required hospitalization: No Has patient had a PCN reaction occurring within the last 10 years: #  #  #  YES  #  #  #  TOLERATED cefazolin pre-op 08/13/17  . Demeclocycline Hives  . Erythromycin Nausea And Vomiting and Other (See Comments)    Severe irritable bowel  . Flagyl [Metronidazole] Nausea And Vomiting and Other (See Comments)    Severe irritable bowel  . Glucophage [Metformin Hcl] Nausea And Vomiting and Other (See Comments)    "Sick" "I won't take anything that has metformin in it"  . Tetracyclines & Related Hives and Rash  . Diovan [Valsartan] Nausea Only       . Sulfa Antibiotics Rash and Other (See Comments)    As child  . Xanax [Alprazolam] Other (See Comments)    UNSPECIFIED REACTION, unknown    Medications:  Prior to Admission medications   Medication Sig Start Date End Date Taking? Authorizing Provider  albuterol (PROVENTIL HFA;VENTOLIN HFA) 108 (90 Base) MCG/ACT inhaler Inhale 2 puffs into the lungs every 8 (eight) hours as needed for wheezing or shortness of breath. 05/29/16  Yes Burnard Hawthorne, FNP  aspirin EC 81 MG tablet Take 1 tablet  (81 mg total) by mouth daily. 03/06/17  Yes End, Harrell Gave, MD  benzonatate (TESSALON) 200 MG capsule Take 1 capsule (200 mg total) by mouth 2 (two) times daily as needed for cough. 11/19/17  Yes [name], MD  Blood Glucose Monitoring Suppl (ONETOUCH VERIO) w/Device KIT 1 Device by Does not apply route daily. Use to test blood glucose once daily. Dx code = E11.9 12/16/17  Yes [name], MD  butalbital-acetaminophen-caffeine (FIORICET, ESGIC) [phone] MG tablet  Take 1 tablet by mouth every 6 (six) hours as needed for headache. 03/05/17  Yes [name], MD  citalopram (CELEXA) 10 MG tablet Take 1 tablet (10 mg total) by mouth daily. 01/29/18  Yes [name], MD  clotrimazole-betamethasone (LOTRISONE) cream Apply 1 application topically 2 (two) times daily. 01/24/18  Yes Gae Dry, MD  cyanocobalamin (,VITAMIN B-12,) 1000 MCG/ML injection Inject 1 ml (1000 mcg ) IM weekly x 4,  Then monthly thereafter Patient taking differently: Inject 1 ml (1000 mcg ) IM monthly 09/23/17  Yes [name], MD  diazepam (VALIUM) 5 MG tablet Take 1 tablet (5 mg total) by mouth at bedtime as needed for anxiety. Related to CPAP use 12/26/17  Yes [name], MD  dicyclomine (BENTYL) 10 MG capsule Take 10 mg by mouth 4 (four) times daily -  before meals and at bedtime.   Yes [provider]  Dulaglutide (TRULICITY) 1.5 FY/1.0FB SOPN Inject 1.5 mg into the skin daily. 01/29/18  Yes [name], MD  Eszopiclone 3 MG TABS Take 1 tablet (3 mg total) by mouth at bedtime. Take immediately before bedtime 12/26/17  Yes [name], MD  glucose blood test strip Use to test blood sugar once daily. Dx code = E11.9 12/16/17  Yes [name], MD  Lancet Devices (ONE TOUCH DELICA LANCING DEV) MISC Use to test blood glucose once daily. Dx code = E11.9 12/16/17  Yes [name], MD  levETIRAcetam (KEPPRA) 500 MG tablet Take 1 tablet (500 mg total) by mouth at bedtime. 02/05/18  Yes Kathrynn Ducking, MD  NONFORMULARY OR COMPOUNDED [place]  Combination Pain Cream -  Baclofen 2%, Doxepin 5%, Gabapentin 6%, Topiramate 2%, Pentoxifylline 3% Apply 1-2 grams to affected area 3-4 times daily Qty. 120 gm 3 refills 10/14/17  Yes Gardiner Barefoot, DPM  nystatin [facility]) powder Apply topically 4 (four) times daily.   Yes [provider]  ondansetron (ZOFRAN) 4 MG tablet Take 1 tablet (4 mg total) by mouth every 8 (eight) hours as needed for nausea or vomiting. 08/13/17  Yes [name], MD  [facility] DELICA LANCETS FINE MISC Use to test blood sguar once daily. Dx code = E11.9 12/16/17  Yes [name], MD  pregabalin (LYRICA) 100 MG capsule Take 100 mg by mouth daily.   Yes [provider]  rosuvastatin (CRESTOR) 10 MG tablet Take 1 tablet (10 mg total) by mouth daily. Patient taking differently: Take 10 mg by mouth as needed.  01/29/18  Yes [name], MD  Syringe/Needle, Disp, (SYRINGE 3CC/25GX1") 25G X 1" 3 ML MISC Use for b12 injections 09/23/17  Yes [name], MD  traMADol (ULTRAM) 50 MG tablet Take 0.5 tablets (25 mg total) by mouth every 12 (twelve) hours as needed for severe pain. 08/21/17  Yes Regalado, Belkys A, MD    ROS:  Out of a complete 14 system review of symptoms, the patient complains only of the following symptoms, and all other reviewed systems are negative.  Decreased activity, decreased appetite, fatigue Ear pain, difficulty swallowing Eye discharge, eye itching, eye redness, light sensitivity, blurred vision Cough, shortness of breath, choking Swollen abdomen, abdominal pain, diarrhea, nausea, incontinence of the bowels Restless legs, insomnia, sleep apnea, frequent waking, daytime sleepiness Joint pain, joint swelling, back pain, aching muscles, muscle cramps, walking difficulty, neck pain, neck stiffness Moles Memory loss, dizziness, headache, weakness Decreased concentration, depression  Blood pressure 131/80, pulse  73,  weight 250 lb 8 oz (113.6 kg), SpO2 98 %.  Physical Exam  General: The patient is alert and cooperative at the time of the examination.  The patient is markedly obese.  Skin: No significant peripheral edema is noted.   Neurologic Exam  Mental status: The patient is alert and oriented x 3 at the time of the examination. The patient has apparent normal recent and remote memory, with an apparently normal attention span and concentration ability.  The Mini-Mental status examination done today shows a total score 30/30.  The patient is able to name 12 four-legged animals in 1 minute.   Cranial nerves: Facial symmetry is present. Speech is normal, no aphasia or dysarthria is noted. Extraocular movements are full. Visual fields are full.  Motor: The patient has good strength in all 4 extremities.  Sensory examination: Soft touch sensation is symmetric on the face, arms, and legs.  Coordination: The patient has good finger-nose-finger and heel-to-shin bilaterally.  Gait and station: The patient has a slightly wide-based gait, the patient uses a cane for ambulation.  Tandem gait was not attempted.  Romberg is negative.  Reflexes: Deep tendon reflexes are symmetric, but are depressed.   CT head 08/19/17:  IMPRESSION: No acute finding by CT. Age related atrophy and mild chronic small-vessel ischemic change of the white matter.  * CT scan images were reviewed online. I agree with the written report.    Assessment/Plan:  1.  Diabetic peripheral neuropathy  2.  Gait disturbance  3.  Morbid obesity  4.  Sleep apnea on CPAP  5.  Reported memory disturbance  The memory issues will need to be followed over time.  If there is progression of the memory, medication for memory may be added to the regimen.  She will remain on Keppra 500 mg daily, she will follow-up in 6 months.  Jill Alexanders MD 02/05/2018 10:38 AM  [facility] [address] [place], [place] [address]  Phone [phone] Fax [phone]

## 2018-02-12 ENCOUNTER — Other Ambulatory Visit: Payer: Self-pay

## 2018-02-12 ENCOUNTER — Encounter: Payer: Self-pay | Admitting: Internal Medicine

## 2018-02-12 NOTE — Telephone Encounter (Signed)
Error

## 2018-02-18 ENCOUNTER — Encounter: Payer: Self-pay | Admitting: Nurse Practitioner

## 2018-02-18 ENCOUNTER — Ambulatory Visit (INDEPENDENT_AMBULATORY_CARE_PROVIDER_SITE_OTHER): Payer: Medicare Other | Admitting: Nurse Practitioner

## 2018-02-18 DIAGNOSIS — G4710 Hypersomnia, unspecified: Secondary | ICD-10-CM | POA: Diagnosis not present

## 2018-02-18 DIAGNOSIS — G4733 Obstructive sleep apnea (adult) (pediatric): Secondary | ICD-10-CM

## 2018-02-18 DIAGNOSIS — Z9989 Dependence on other enabling machines and devices: Secondary | ICD-10-CM | POA: Diagnosis not present

## 2018-02-18 NOTE — Assessment & Plan Note (Signed)
Compliance is excellent with CPAP Continue current settings 4-10 CM h20 Work on healthy weight Do not drive while sleepy Follow up in 1 year with Dr. Annamaria Boots

## 2018-02-18 NOTE — Patient Instructions (Addendum)
Compliance is excellent with CPAP Continue current settings Work on healthy weight Do not drive while sleepy Follow up in 1 year with Dr. Annamaria Boots

## 2018-02-18 NOTE — Assessment & Plan Note (Signed)
Patient Instructions  Compliance is excellent with CPAP Continue current settings Work on healthy weight Do not drive while sleepy Follow up in 1 year with Dr. Annamaria Boots

## 2018-02-18 NOTE — Progress Notes (Signed)
'@Patient'  ID: Denise Sharp, female    DOB: 01-10-1946, 72 y.o.   MRN: [id number]  Chief Complaint  Patient presents with  . Follow-up    States she is still very tired and not seeping well.     Referring provider: [name], MD  72 year old female who returns today for follow up on CPAP compliance - followed by Dr. Annamaria Boots. Health history never smoker, OSA, hypersomnia, insomnia, complicated by IBS  HPI Patient was ordered new CPAP machine at last visit. Her old machine was more than 72 years old. She states that she really likes the new machine. It is much quieter and works well for her. She has had a couple of surgical procedures over the past year and is still weak and more tired than usual since these procedures, but states that she is gradually regaining strength and feels more refreshed after using her CPAP.   Recent [facility] Encounters:  OV 10-31-17 Plan: Order- [facility]-  Please replace old CPAP machine, change to auto 4- 10, mask of choice, humidifier, supplies, AirView    Dx OSA  Try otc saline nasal spray as needed for stuffy nasal discomfort  Try using Flonase 1-2 puffs each nostril, at bedtime. That way, even if it makes you drowsy, it will help.   NPSG 08/14/12 at [facility]/ Sleep Med   Mild OSA, AHI 13.2/ hr, weight 218 lbs   CPAP 6 NPSG 05/22/15- CPAP Titration study- Optimal 7 cwp,  MSLT 05/23/15 ( she had taken Lyrica the night before during CPAP titration against protocol). 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  Reports: Compliance report from 01-17-18 - 02-15-18 Usage days 30/30 (100%), average usage 8 hours 52 minutes, CPAP autoset 4-10 cm H20, AHI:1.5.    Allergies  Allergen Reactions  . Biaxin [Clarithromycin] Nausea And Vomiting and Other (See Comments)    Severe irritable bowel  . Amoxicillin  Diarrhea and Other (See Comments)    .PATIENT HAS HAD A PCN REACTION WITH IMMEDIATE RASH, FACIAL/TONGUE/THROAT SWELLING, SOB, OR LIGHTHEADEDNESS WITH HYPOTENSION:  #  #  #  YES  #  #  #   Has patient had a PCN reaction causing severe rash involving mucus membranes or skin necrosis: No Has patient had a PCN reaction that required hospitalization: No Has patient had a PCN reaction occurring within the last 10 years: #  #  #  YES  #  #  #  TOLERATED cefazolin pre-op 08/13/17  . Demeclocycline Hives  . Erythromycin Nausea And Vomiting and Other (See Comments)    Severe irritable bowel  . Flagyl [Metronidazole] Nausea And Vomiting and Other (See Comments)    Severe irritable bowel  . Glucophage [Metformin Hcl] Nausea And Vomiting and Other (See Comments)    "Sick" "I won't take anything that has metformin in it"  . Tetracyclines & Related Hives and Rash  . Diovan [Valsartan] Nausea Only       . Sulfa Antibiotics Rash and Other (See Comments)    As child  . Xanax [Alprazolam] Other (See Comments)    UNSPECIFIED REACTION, unknown    Immunization History  Administered Date(s) Administered  . Influenza Split 06/13/2011  . Influenza,inj,Quad PF,6+ Mos 05/04/2014, 05/12/2015  . Influenza-Unspecified 04/15/2012  . Pneumococcal Conjugate-13 08/11/2013, 01/06/2014  . Pneumococcal Polysaccharide-23 01/19/2010  . Tdap 04/18/2016  Past Medical History:  Diagnosis Date  . Abnormality of gait 03/25/2013  . Adrenal mass, left ([place])   . Anemia    iron deficient post  2 unit txfsn 2009, normal endo/colonoscopy by [facility]  . Arthritis   . Atypical chest pain    a. 04/1986 Cath ([facility]): nl cors, EF 65%; b. 07/2012 Cath [facility]): LM nl, LAD/LCX/RCA w/ min irregs; c. 07/2016 [facility]): "Equivocal" w/ small inf wall rev defect and hyperdynamic LV contraction, EF 87%; d. 08/2016 Cardiac CT Ca2+ score [facility]): Ca2+ score 1548.  [name] Cervical spinal stenosis 1994   due to trauma to back ([employer] accident), has  intermittent paralysis and parasthesias  . Cervicogenic headache 03/23/2014  . Depression   . Dizziness    chronic dizziness  . DJD (degenerative joint disease)    a. Chronic R shoulder pain pending R shoulder replacement 07/2017.  [name] Esophageal stenosis March 2011   with transietn outlet obstruction by food, cleared by EGD   . Family history of adverse reaction to anesthesia    daughter PONV  . Gastric bypass status for obesity   . Headache(784.0)   . Hypertension   . IBS (irritable bowel syndrome)   . Left bundle branch block (LBBB)    a. Intermittently present - likely rate related. - patient denies  . Obesity   . Obstructive sleep apnea    using CPAP  . 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 Restless leg syndrome   . Rotator cuff arthropathy, right 08/13/2017  . Syncope and collapse 03/12/2014  . Type II diabetes mellitus ([facility])     Tobacco History: Social History   Tobacco Use  Smoking Status Never Smoker  Smokeless Tobacco Never Used   Counseling given: Never smoker   Outpatient Encounter Medications as of 02/18/2018  Medication Sig  . albuterol (PROVENTIL HFA;VENTOLIN HFA) 108 (90 Base) MCG/ACT inhaler Inhale 2 puffs into the lungs every 8 (eight) hours as needed for wheezing or shortness of breath.  [name] aspirin EC 81 MG tablet Take 1 tablet (81 mg total) by mouth daily.  . benzonatate (TESSALON) 200 MG capsule Take 1 capsule (200 mg total) by mouth 2 (two) times daily as needed for cough.  . Blood Glucose Monitoring Suppl (ONETOUCH VERIO) w/Device KIT 1 Device by Does not apply route daily. Use to test blood glucose once daily. Dx code = E11.9  . butalbital-acetaminophen-caffeine (FIORICET, ESGIC) 50-325-40 MG tablet Take 1 tablet by mouth every 6 (six) hours as needed for headache.  . citalopram (CELEXA) 10 MG tablet Take 1 tablet (10 mg total) by mouth daily.  . clotrimazole-betamethasone (LOTRISONE) cream Apply 1 application topically 2 (two) times daily.  .  cyanocobalamin (,VITAMIN B-12,) 1000 MCG/ML injection Inject 1 ml (1000 mcg ) IM weekly x 4,  Then monthly thereafter (Patient taking differently: Inject 1 ml (1000 mcg ) IM monthly)  . diazepam (VALIUM) 5 MG tablet Take 1 tablet (5 mg total) by mouth at bedtime as needed for anxiety. Related to CPAP use  . dicyclomine (BENTYL) 10 MG capsule Take 10 mg by mouth 4 (four) times daily -  before meals and at bedtime.  . Dulaglutide (TRULICITY) 1.5 QB/1.6XI SOPN Inject 1.5 mg into the skin daily.  . Eszopiclone 3 MG TABS Take 1 tablet (3 mg total) by mouth at bedtime. Take immediately before bedtime  . glucose blood test strip Use to test blood sugar once daily. Dx code = E11.9  . Lancet Devices (ONE TOUCH DELICA LANCING DEV)  MISC Use to test blood glucose once daily. Dx code = E11.9  . levETIRAcetam (KEPPRA) 500 MG tablet Take 1 tablet (500 mg total) by mouth at bedtime.  . NONFORMULARY OR COMPOUNDED [place]  Combination Pain Cream -  Baclofen 2%, Doxepin 5%, Gabapentin 6%, Topiramate 2%, Pentoxifylline 3% Apply 1-2 grams to affected area 3-4 times daily Qty. 120 gm 3 refills  . nystatin [facility]) powder Apply topically 4 (four) times daily.  . ondansetron (ZOFRAN) 4 MG tablet Take 1 tablet (4 mg total) by mouth every 8 (eight) hours as needed for nausea or vomiting.  [facility] DELICA LANCETS FINE MISC Use to test blood sguar once daily. Dx code = E11.9  . pregabalin (LYRICA) 100 MG capsule Take 100 mg by mouth daily.  . rosuvastatin (CRESTOR) 10 MG tablet Take 1 tablet (10 mg total) by mouth daily. (Patient taking differently: Take 10 mg by mouth as needed. )  . Syringe/Needle, Disp, (SYRINGE 3CC/25GX1") 25G X 1" 3 ML MISC Use for b12 injections  . traMADol (ULTRAM) 50 MG tablet Take 0.5 tablets (25 mg total) by mouth every 12 (twelve) hours as needed for severe pain.   No facility-administered encounter medications on file as of 02/18/2018.      Review of Systems  Review of  Systems  Constitutional: Negative.   HENT: Negative.   Respiratory: Negative for cough and shortness of breath.   Cardiovascular: Negative.   Gastrointestinal: Negative.   Allergic/Immunologic: Negative.   Neurological: Negative.   Psychiatric/Behavioral: Negative.        Physical Exam  BP 118/82   Pulse 89   Ht '5\' 4"'  (1.626 m)   Wt 247 lb 6.4 oz (112.2 kg)   SpO2 98%   BMI 42.47 kg/m   Wt Readings from Last 5 Encounters:  02/18/18 247 lb 6.4 oz (112.2 kg)  02/05/18 250 lb 8 oz (113.6 kg)  01/29/18 252 lb 3.2 oz (114.4 kg)  01/24/18 250 lb (113.4 kg)  01/13/18 249 lb 8 oz (113.2 kg)     Physical Exam  Constitutional: She is oriented to person, place, and time. She appears well-developed and well-nourished. No distress.  Cardiovascular: Normal rate and regular rhythm.  Pulmonary/Chest: Effort normal and breath sounds normal.  Neurological: She is alert and oriented to person, place, and time.  Psychiatric: She has a normal mood and affect.  Nursing note and vitals reviewed.        Assessment & Plan:   Hypersomnia, persistent Compliance is excellent with CPAP Continue current settings 4-10 CM h20 Work on healthy weight Do not drive while sleepy Follow up in 1 year with Dr. Annamaria Boots   OSA on CPAP Patient Instructions  Compliance is excellent with CPAP Continue current settings Work on healthy weight Do not drive while sleepy Follow up in 1 year with Dr. Annamaria Boots    Discussion: Discussed with patient that sleep apnea affects health problems. This includes risks for cardiovascular disease, hypertension, and diabetes. Weight loss can improve sleep apnea.    Fenton Foy, NP 02/18/2018

## 2018-02-21 ENCOUNTER — Other Ambulatory Visit: Payer: Medicare Other

## 2018-02-24 ENCOUNTER — Ambulatory Visit (INDEPENDENT_AMBULATORY_CARE_PROVIDER_SITE_OTHER): Payer: Medicare Other | Admitting: Pharmacist

## 2018-02-24 ENCOUNTER — Encounter: Payer: Self-pay | Admitting: Pharmacist

## 2018-02-24 DIAGNOSIS — E1142 Type 2 diabetes mellitus with diabetic polyneuropathy: Secondary | ICD-10-CM | POA: Diagnosis not present

## 2018-02-24 LAB — LIPID PANEL
Cholesterol: 193 mg/dL (ref 0–200)
HDL: 51.4 mg/dL (ref >39.00)
LDL Cholesterol: 102 mg/dL — ABNORMAL HIGH (ref 0–99)
NonHDL: 141.61
Total CHOL/HDL Ratio: 4
Triglycerides: 197 mg/dL — ABNORMAL HIGH (ref 0.0–149.0)
VLDL: 39.4 mg/dL (ref 0.0–40.0)

## 2018-02-24 LAB — COMPREHENSIVE METABOLIC PANEL
ALT: 18 U/L (ref 0–35)
AST: 24 U/L (ref 0–37)
Albumin: 3.9 g/dL (ref 3.5–5.2)
Alkaline Phosphatase: 110 U/L (ref 39–117)
BUN: 15 mg/dL (ref 6–23)
CO2: 28 mEq/L (ref 19–32)
Calcium: 9.6 mg/dL (ref 8.4–10.5)
Chloride: 101 mEq/L (ref 96–112)
Creatinine, Ser: 1.04 mg/dL (ref 0.40–1.20)
GFR: 55.3 mL/min — ABNORMAL LOW (ref >60.00)
Glucose, Bld: 156 mg/dL — ABNORMAL HIGH (ref 70–99)
Potassium: 4.5 mEq/L (ref 3.5–5.1)
Sodium: 138 mEq/L (ref 135–145)
Total Bilirubin: 0.5 mg/dL (ref 0.2–1.2)
Total Protein: 7.6 g/dL (ref 6.0–8.3)

## 2018-02-24 LAB — HEMOGLOBIN A1C: Hgb A1c MFr Bld: 7.9 % — ABNORMAL HIGH (ref 4.6–6.5)

## 2018-02-24 NOTE — Assessment & Plan Note (Signed)
Diabetes longstanding currently uncontrolled, but improved per SMBG log. Patient is able to verbalize appropriate hypoglycemia management plan. Patient is adherent to medications as prescribed. Moving forward, SGLT2 medications may not be a preferred option d/t patient's history of urinary incontinence. -Continue Trulicity 1.5 mg weekly to see full benefit of medication -Requested that patient check fasting blood sugar at least 3-4 times weekly -Extensively discussed pathophysiology of DM, recommended lifestyle interventions, dietary effects on glycemic control -Counseled on s/sx of and management of hypoglycemia -Patient due for labs, including A1C. Checking today.

## 2018-02-24 NOTE — Patient Instructions (Signed)
It was nice to meet you today!   Continue taking Trulicity 1.5 mg weekly. Check fasting blood sugars a few times a week. Goal fasting blood sugars are 80-130.   Keep up the great recovery work.    Schedule follow up with me in about 4 weeks.    Catie Darnelle Maffucci, PharmD

## 2018-02-24 NOTE — Progress Notes (Addendum)
    S:     Chief Complaint  Patient presents with  . Medication Management    Diabetes    Patient arrives in good spirits with her husband, ambulating without assistance.  Presents for diabetes evaluation, education, and management at the request of [name] at last visit on 01/29/2018.   Today, she reports that she increased Trulicity to 1.5 mg 2 weeks ago. She denies increased nausea/vomiting or GI upset with this increased dose, but does endorse a baseline level of nausea and lack of appetite. She notes that she is not sure these symptoms are completely related to Trulicity, as she has a history of gastrointestinal troubles s/p gastric bypass x2. Denies s/sx hypoglycemia.   Insurance coverage/medication affordability: Prescription coverage through [place]  Patient reports adherence with medications.  Current diabetes medications include: Trulicity 1.5 mg weekly - Hx GI intolerance to metformin  Patient denies hypoglycemic events.  O:  Physical Exam  Constitutional: She appears well-developed and well-nourished.  Vitals reviewed.  Review of Systems  All other systems reviewed and are negative.  Lab Results  Component Value Date   HGBA1C 9.1 (H) 11/19/2017   Vitals:   02/24/18 1141  Pulse: 64    Lipid Panel     Component Value Date/Time   CHOL 183 06/04/2017 1412   CHOL 155 02/27/2014 0420   TRIG 195.0 (H) 06/04/2017 1412   TRIG 121 02/27/2014 0420   HDL 48.50 06/04/2017 1412   HDL 43 02/27/2014 0420   CHOLHDL 4 06/04/2017 1412   VLDL 39.0 06/04/2017 1412   VLDL 24 02/27/2014 0420   LDLCALC 96 06/04/2017 1412   LDLCALC 88 02/27/2014 0420   LDLDIRECT 111.0 06/16/2015 0936    Home fasting CBG: 140s-150s, but has only been checking ~ once weekly  Clinical ASCVD:No;High-risk conditions: Age 36 or older, DM, HTN ASCVD risk factors: age 90-75 10 year ASCVD risk:22%   A/P:  Following discussion and approval by [name], the following medication changes were  made:   #Diabetes longstanding currently uncontrolled, but improved per SMBG log. Patient is able to verbalize appropriate hypoglycemia management plan. Patient is adherent to medications as prescribed. Moving forward, SGLT2 medications may not be a preferred option d/t patient's history of urinary incontinence. -Continue Trulicity 1.5 mg weekly to see full benefit of medication -Requested that patient check fasting blood sugar at least 3-4 times weekly -Extensively discussed pathophysiology of DM, recommended lifestyle interventions, dietary effects on glycemic control -Counseled on s/sx of and management of hypoglycemia -Patient due for labs, including A1C. Checking today.  #ASCVD risk - primary prevention in patient aged 70-75 with DM, baseline LDL 70-189, high intensity statinindicated.  - Continue rosuvastatin 10 mg daily.  - Checking lipid panel today.  Written patient instructions provided.  Total time in face to face counseling 45 minutes.   Follow up Pharmacist Clinic Visit in 4 weeks.    Catie Darnelle Maffucci, PharmD PGY2 Ambulatory Care Pharmacy Resident Phone: [phone]   I have reviewed the above information and agree with above.   Deborra Medina, MD

## 2018-02-28 ENCOUNTER — Encounter: Payer: Medicare Other | Attending: Internal Medicine | Admitting: Dietician

## 2018-02-28 ENCOUNTER — Encounter: Payer: Self-pay | Admitting: Dietician

## 2018-02-28 VITALS — Ht 64.0 in | Wt 249.7 lb

## 2018-02-28 DIAGNOSIS — E1142 Type 2 diabetes mellitus with diabetic polyneuropathy: Secondary | ICD-10-CM | POA: Diagnosis not present

## 2018-02-28 DIAGNOSIS — I10 Essential (primary) hypertension: Secondary | ICD-10-CM | POA: Insufficient documentation

## 2018-02-28 DIAGNOSIS — Z713 Dietary counseling and surveillance: Secondary | ICD-10-CM | POA: Insufficient documentation

## 2018-02-28 DIAGNOSIS — K580 Irritable bowel syndrome with diarrhea: Secondary | ICD-10-CM

## 2018-02-28 DIAGNOSIS — E1140 Type 2 diabetes mellitus with diabetic neuropathy, unspecified: Secondary | ICD-10-CM

## 2018-02-28 DIAGNOSIS — G4730 Sleep apnea, unspecified: Secondary | ICD-10-CM | POA: Insufficient documentation

## 2018-02-28 DIAGNOSIS — Z9884 Bariatric surgery status: Secondary | ICD-10-CM | POA: Insufficient documentation

## 2018-02-28 DIAGNOSIS — Z6841 Body Mass Index (BMI) 40.0 and over, adult: Secondary | ICD-10-CM | POA: Diagnosis not present

## 2018-02-28 DIAGNOSIS — K219 Gastro-esophageal reflux disease without esophagitis: Secondary | ICD-10-CM | POA: Insufficient documentation

## 2018-02-28 NOTE — Patient Instructions (Signed)
   Allow 30 minutes to eat a meal or snack, eating slowly and eat what you can. Then wait at least 2-3 hours before eating again.   Eat or drink something every 3-5 hours during the day.   Try some protein drinks when unable to eat.   Eat small portions of soft foods, mashed potatoes, mashed squash, oatmeal -- try adding unflavored protein powder into theses foods to boost nutrition.

## 2018-02-28 NOTE — Progress Notes (Signed)
Medical Nutrition Therapy: Visit start time: 3491  end time: 1115  Assessment:  Diagnosis: Type 2 DM, IBS, obesity Medical history changes: no changes per patient Psychosocial issues/ stress concerns: health-related stress  Current weight: 249.7lbs Height: 5'4" Medications, supplement changes: reconciled list in medical record  Progress and evaluation: Weight has been stable for the past 6 weeks since previous visit. Patient reports some improvement in blood sugars with increase in Trulicity. She also reports some improvement in diarrhea episodes. Has begun taking dentures out earlier in evening to avoid late night eating.  She reports eating very small portions, ie one hamburger patty lasts all day, few bites of green beans. She has tried some protein drinks, but does not like what she has tried so far.   Physical activity: none, fears falling.   Dietary Intake:  Eating pattern is erratic according to GI symptoms, schedule, appetite.  Dining out frequency: multiple meals per week.  Breakfast: none Snack: none Lunch: 1-3pm especially when going to appts. Egg with bell pepper, onion, sm amt tomato; hamburger patty, chicken (dark meat) Snack: sometimes sweets cupcake, occasional donut hole -- sweets seem to settle better in stomach Supper: 8/15 shrimp, rice, salad -- mostly salad, few bites shrimp Snack: supper leftovers, peanut butter sandwich, 1/2 - 1 grilled cheese sandwich, popcorn sometimes Beverages: flavored water  Nutrition Care Education: Topics covered: weight control, GI health Basic nutrition: appropriate meal and snack schedule, goals for meeting basic needs   Weight control: factors affecting weight, importance of adequate nutrition, role of gut bacteria, stress, basal metabolic rate and factors influencing BMR, role of physical activity GI health: role of gut bacteria, effects of weight loss surgery, healing process; encouraged adequate protein intake and choosing soft,  easy-to-digest foods.    Nutritional Diagnosis:  [place]-2.1 Inpaired nutrition utilization and [place]-2.2 Altered nutrition-related laboratory As related to chronic diarrhea, history of gastric bypass surgery.  As evidenced by patient report of GI symptoms. [place]-1.4 Altered GI function As related to history of gastric bypass surgery, IBS.  As evidenced by patient report of GI symptoms and health history. [place]-3.3 Overweight/obesity As related to inactivity due to neuropath, history of excess calories.  As evidenced by patient with BMI of 42, patient reported diet and activity history.  Intervention: Instruction and discussion as noted above.   Advised more structured eating pattern, protein supplements to help meet daily needs, soft foods to aid digestion.    Set goals to work on above-listed recommendations.    No further follow-up scheduled at this time, patient will schedule later if needed.   Education Materials given:  [name] Goals/ instructions   Learner/ who was taught:  . Patient  . Spouse/ partner   Level of understanding: [name] Verbalizes/ demonstrates competency  Demonstrated degree of understanding via:   Teach back Learning barriers: . None   Willingness to learn/ readiness for change: . Eager, change in progress   Monitoring and Evaluation:  Dietary intake, exercise, GI symptoms, BG control, and body weight      follow up: prn

## 2018-03-02 ENCOUNTER — Telehealth: Payer: Self-pay | Admitting: Cardiology

## 2018-03-02 NOTE — Telephone Encounter (Signed)
Pt called concerned about he B/P being low- 114 laying, 80's earlier this am. No syncope, no tachycardia, no nausea vomiting, but h/o chronic diarrhea. She is on no medications that would lower her B/P. I suggested she rest today and hydrate. She will contact her PCP in the morning or go to the ED if she has syncope or near syncope.   Kerin Ransom PA-C 03/02/2018 1:27 PM

## 2018-03-13 ENCOUNTER — Ambulatory Visit: Payer: Medicare Other | Admitting: Internal Medicine

## 2018-03-19 ENCOUNTER — Ambulatory Visit: Payer: Medicare Other | Admitting: Internal Medicine

## 2018-03-24 ENCOUNTER — Ambulatory Visit (INDEPENDENT_AMBULATORY_CARE_PROVIDER_SITE_OTHER): Payer: Medicare Other | Admitting: Pharmacist

## 2018-03-24 ENCOUNTER — Encounter: Payer: Self-pay | Admitting: Pharmacist

## 2018-03-24 DIAGNOSIS — E1142 Type 2 diabetes mellitus with diabetic polyneuropathy: Secondary | ICD-10-CM

## 2018-03-24 MED ORDER — INSULIN GLARGINE 100 UNIT/ML SOLOSTAR PEN: 10.0000 [IU] | PEN_INJECTOR | Freq: Every day | SUBCUTANEOUS | 3 refills | Status: DC

## 2018-03-24 MED ORDER — INSULIN PEN NEEDLE 31G X 5 MM MISC: 3 refills | Status: DC

## 2018-03-24 NOTE — Patient Instructions (Addendum)
It was great to see you today!  We are going to start Lantus (insulin glargine) at 10 units daily. Increase by 1 unit every 3 days until fasting blood sugar is consistently between 80-130 mg/dL.    Schedule follow up with me in 3-4 weeks.

## 2018-03-24 NOTE — Progress Notes (Addendum)
    S:     Chief Complaint  Patient presents with  . Medication Management    Diabetes    Patient arrives in good spirits with her husband, ambulating without assistance.  Presents for diabetes evaluation, education, and management at the request of [name] (referred on 01/29/2018). At last Pharmacy visit on 02/24/2018, we continued Trulicity at 1.5 mg daily.   Today, she notes a consistently low amount of nausea, but does not believe that this has gotten any worse with Trulicity. She notes continued frustrations with her lack of appetite, but inability to lose weight.   Insurance coverage/medication affordability: [facility] Prescription Coverage  Patient reports adherence with medications.  Current diabetes medications include: Trulicity 1.5 mg weekly   Patient denies hypoglycemic s/sx including dizziness, shakiness, sweating. Patient denies hyperglycemic symptoms including polyuria, polydipsia, polyphagia, nocturia, neuropathy, blurred vision. Patient reports self foot exams.    O:  Physical Exam  Constitutional: She appears well-developed and well-nourished.  Vitals reviewed.    Review of Systems  All other systems reviewed and are negative.    Lab Results  Component Value Date   HGBA1C 7.9 (H) 02/24/2018   Vitals:   03/24/18 1355  BP: (!) 137/91  Pulse: 71    Basic Metabolic Panel BMP Latest Ref Rng & Units 02/24/2018 11/19/2017 09/19/2017  Glucose 70 - 99 mg/dL 156(H) 242(H) 211(H)  BUN 6 - 23 mg/dL 15 14 17   Creatinine 0.40 - 1.20 mg/dL 1.04 0.82 0.87  Sodium 135 - 145 mEq/L 138 139 139  Potassium 3.5 - 5.1 mEq/L 4.5 3.7 3.8  Chloride 96 - 112 mEq/L 101 103 102  CO2 19 - 32 mEq/L 28 26 26   Calcium 8.4 - 10.5 mg/dL 9.6 8.7 9.0     Lipid Panel     Component Value Date/Time   CHOL 193 02/24/2018 1159   CHOL 155 02/27/2014 0420   TRIG 197.0 (H) 02/24/2018 1159   TRIG 121 02/27/2014 0420   HDL 51.40 02/24/2018 1159   HDL 43 02/27/2014 0420   CHOLHDL 4 02/24/2018  1159   VLDL 39.4 02/24/2018 1159   VLDL 24 02/27/2014 0420   LDLCALC 102 (H) 02/24/2018 1159   LDLCALC 88 02/27/2014 0420   LDLDIRECT 111.0 06/16/2015 0936    Fasting SMBG: 140s-150s  Clinical ASCVD: No    A/P: Following discussion and approval by [name], the following medication changes were made:   #Diabetes - Currently uncontrolled, most recent A1c 7.9% on 02/24/2018 improved from 9.1% on 11/19/2017; Goal A1c <7%. Patient denies hypoglycemic events. Patient reports adherence with medication. Will avoid initiation of SGLT2 agents d/t hx urinary incontinence - Start Lantus 10 units daily, increase by 1 units every 3 days until fasting SMBG consistently between 80-130. - Next A1C anticipated 05/2018.     Written patient instructions provided.  Total time in face to face counseling 45 minutes.    Follow up in Pharmacist Clinic Visit 3-4 weeks.   De Hollingshead, PharmD PGY2 Ambulatory Care Pharmacy Resident Phone: [phone]   I have reviewed the above information and agree with above.   Deborra Medina, MD

## 2018-03-24 NOTE — Assessment & Plan Note (Signed)
Diabetes - Currently uncontrolled, most recent A1c 7.9% on 02/24/2018 improved from 9.1% on 11/19/2017; Goal A1c <7%. Patient denies hypoglycemic events. Patient reports adherence with medication. Will avoid initiation of SGLT2 agents d/t hx urinary incontinence - Start Lantus 10 units daily, increase by 1 units every 3 days until fasting SMBG consistently between 80-130. - Next A1C anticipated 05/2018.

## 2018-03-27 ENCOUNTER — Telehealth: Payer: Self-pay | Admitting: Internal Medicine

## 2018-03-27 MED ORDER — INSULIN PEN NEEDLE 31G X 5 MM MISC: 0 refills | Status: DC

## 2018-03-27 NOTE — Telephone Encounter (Signed)
rx request 

## 2018-03-27 NOTE — Telephone Encounter (Signed)
Copied from [place] [id number]. Topic: Quick Communication - See Telephone Encounter >> Mar 27, 2018 11:29 AM Synthia Innocent wrote: CRM for notification. See Telephone encounter for: 03/27/18. Checking to see if office has any samples of Insulin Pen Needle 31G X 5 MM MISC, it will be over 10 days before mail order can get to her. Or call some into local [facility] [phone] Please advise

## 2018-03-27 NOTE — Telephone Encounter (Signed)
A 30 day supply was sent to local [facility] pharmacy.

## 2018-04-02 DIAGNOSIS — Z6841 Body Mass Index (BMI) 40.0 and over, adult: Secondary | ICD-10-CM | POA: Diagnosis not present

## 2018-04-02 DIAGNOSIS — M25511 Pain in right shoulder: Secondary | ICD-10-CM | POA: Diagnosis not present

## 2018-04-17 ENCOUNTER — Ambulatory Visit: Payer: Medicare Other | Admitting: Podiatry

## 2018-04-25 ENCOUNTER — Ambulatory Visit (INDEPENDENT_AMBULATORY_CARE_PROVIDER_SITE_OTHER): Payer: Medicare Other | Admitting: Family Medicine

## 2018-04-25 ENCOUNTER — Encounter: Payer: Self-pay | Admitting: Family Medicine

## 2018-04-25 VITALS — BP 124/68 | HR 77 | Temp 98.8°F | Ht 64.0 in | Wt 250.6 lb

## 2018-04-25 DIAGNOSIS — R300 Dysuria: Secondary | ICD-10-CM | POA: Diagnosis not present

## 2018-04-25 DIAGNOSIS — K521 Toxic gastroenteritis and colitis: Secondary | ICD-10-CM | POA: Diagnosis not present

## 2018-04-25 DIAGNOSIS — N3000 Acute cystitis without hematuria: Secondary | ICD-10-CM | POA: Diagnosis not present

## 2018-04-25 DIAGNOSIS — T3695XA Adverse effect of unspecified systemic antibiotic, initial encounter: Secondary | ICD-10-CM | POA: Diagnosis not present

## 2018-04-25 LAB — POCT URINALYSIS DIPSTICK
Blood, UA: NEGATIVE
Glucose, UA: NEGATIVE
Ketones, UA: 5
Protein, UA: NEGATIVE
Spec Grav, UA: >=1.03 — AB (ref 1.010–1.025)
Urobilinogen, UA: 2 E.U./dL — AB
pH, UA: 5 (ref 5.0–8.0)

## 2018-04-25 MED ORDER — PROBIOTIC COMPLEX ACIDOPHILUS PO CAPS: 1.0000 | ORAL_CAPSULE | Freq: Every day | ORAL | 1 refills | Status: DC

## 2018-04-25 MED ORDER — CEFUROXIME AXETIL 500 MG PO TABS: 500.0000 mg | ORAL_TABLET | Freq: Two times a day (BID) | ORAL | 0 refills | Status: AC

## 2018-04-25 MED ORDER — CEFTRIAXONE SODIUM 1 G IJ SOLR
1.0000 g | Freq: Once | INTRAMUSCULAR | Status: AC
  Administered 2018-04-25: 1 g via INTRAMUSCULAR

## 2018-04-25 NOTE — Progress Notes (Signed)
Subjective:    Patient ID: Denise Sharp, female    DOB: 07/28/1945, 72 y.o.   MRN: [id number]  HPI   Patient is a clinic complaining of burning with urination, increased urgency and frequency and lower abdominal pain for about 1 week, getting worse this week is gone on.  States she could not take it anymore last night so has been went to local pharmacy and got her over-the-counter Azo for bladder pain relief.  States she took a few doses of this and it did help mildly to improve symptoms.  Denies any fever or chills.  Denies nausea or vomiting.  States she has chronic diarrhea due to IBS.  Recent labs reviewed: CMP Latest Ref Rng & Units 02/24/2018 11/19/2017 09/19/2017  Glucose 70 - 99 mg/dL 156(H) 242(H) 211(H)  BUN 6 - 23 mg/dL 15 14 17   Creatinine 0.40 - 1.20 mg/dL 1.04 0.82 0.87  Sodium 135 - 145 mEq/L 138 139 139  Potassium 3.5 - 5.1 mEq/L 4.5 3.7 3.8  Chloride 96 - 112 mEq/L 101 103 102  CO2 19 - 32 mEq/L 28 26 26   Calcium 8.4 - 10.5 mg/dL 9.6 8.7 9.0  Total Protein 6.0 - 8.3 g/dL 7.6 6.8 7.0  Total Bilirubin 0.2 - 1.2 mg/dL 0.5 0.2 0.3  Alkaline Phos 39 - 117 U/L 110 104 112  AST 0 - 37 U/L 24 23 12   ALT 0 - 35 U/L 18 21 12    CBC Latest Ref Rng & Units 11/19/2017 09/19/2017 08/20/2017  WBC 4.0 - 10.5 K/uL 10.3 8.9 10.1  Hemoglobin 12.0 - 15.0 g/dL 11.9(L) 11.7(L) 10.2(L)  Hematocrit 36.0 - 46.0 % 36.7 36.2 32.6(L)  Platelets 150.0 - 400.0 K/uL 286.0 239.0 326     Patient Active Problem List   Diagnosis Date Noted  . GAD (generalized anxiety disorder) 02/01/2018  . Rectocele 01/24/2018  . Vaginal prolapse 01/04/2018  . Hepatic steatosis 01/04/2018  . Microalbuminuria due to type 2 diabetes mellitus ([place]) 11/21/2017  . Hospital discharge follow-up 09/22/2017  . Hypocalcemia 09/22/2017  . Hypotension (arterial) 09/22/2017  . Fecal incontinence 09/22/2017  . B12 deficiency 09/22/2017  . Rotator cuff arthropathy, right 08/13/2017  . Primary localized osteoarthrosis of  shoulder 08/13/2017  . Encounter for preoperative examination for general surgical procedure 08/03/2017  . 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 Candidiasis, intertrigo 08/03/2017  . 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 Atypical chest pain 10/16/2016  . Edema 12/13/2015  . Pain and swelling of right lower leg 12/02/2015  . Hypersomnia, persistent 06/18/2015  . Mechanical breakdown of implanted electronic neurostimulator of peripheral nerve ([place]) 10/16/2014  . Obesity hypoventilation syndrome ([place]) 08/13/2014  . Frequent falls 06/23/2014  . Chronic cough 05/05/2014  . Adenoma of left adrenal gland 03/24/2014  . Cervicogenic headache 03/23/2014  . Syncope and collapse 03/12/2014  . Vitamin D deficiency 01/06/2014  . Pannus, abdominal 12/23/2013  . Rectal bleeding 12/03/2013  . Weight gain 12/03/2013  . Obesity, unspecified 08/25/2013  . Fatigue due to depression 08/04/2013  . Diaphragmatic hernia 04/06/2013  . Diabetic polyneuropathy ([place]) 03/25/2013  . Abnormality of gait 03/25/2013  . Multiple pulmonary nodules 03/20/2013  . Shortness of breath 03/20/2013  . Hypomagnesemia 08/06/2012  . Anemia 08/06/2012  . Iron deficiency anemia 08/06/2012  . Functional disorder of bladder 07/15/2012  . Incomplete bladder emptying 07/15/2012  . 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 Urge incontinence 07/15/2012  . Increased frequency of  urination 07/15/2012  . OSA on CPAP 03/02/2012  . Insomnia secondary to anxiety 02/29/2012  . Sciatica 09/05/2011  . Cervical stenosis of spinal canal 06/14/2011  . Restless legs syndrome 03/13/2011  . Degenerative disk disease 03/13/2011  . Hypertension 03/12/2011  . DM type 2 with diabetic peripheral neuropathy ([place]) 03/12/2011  . Hearing loss, right 03/12/2011   Social History   Tobacco Use  . Smoking status: Never Smoker  . Smokeless tobacco: Never Used  Substance Use  Topics  . Alcohol use: No   Review of Systems   Constitutional: Negative for chills, fatigue and fever.  HENT: Negative for congestion, ear pain, sinus pain and sore throat.   Eyes: Negative.   Respiratory: Negative for cough, shortness of breath and wheezing.   Cardiovascular: Negative for chest pain, palpitations and leg swelling.  Gastrointestinal: Negative for abdominal pain, diarrhea, nausea and vomiting.  Genitourinary: +dysuria, frequency and urgency.  Musculoskeletal: Negative for arthralgias and myalgias.  Skin: Negative for color change, pallor and rash.  Neurological: Negative for syncope, light-headedness and headaches.  Psychiatric/Behavioral: The patient is not nervous/anxious.       Objective:   Physical Exam  Constitutional: She is oriented to person, place, and time. No distress.  HENT:  Head: Normocephalic and atraumatic.  Eyes: No scleral icterus.  Neck: Neck supple. No tracheal deviation present.  Cardiovascular: Normal rate and regular rhythm.  Pulmonary/Chest: Effort normal and breath sounds normal. No respiratory distress.  Abdominal: Soft. Bowel sounds are normal. She exhibits no distension. There is tenderness.  +suprapubic tenderness.   Musculoskeletal: She exhibits no edema.  Gait normal.   Neurological: She is alert and oriented to person, place, and time.  Nursing note and vitals reviewed.   Vitals:   04/25/18 1439  BP: 124/68  Pulse: 77  Temp: 98.8 F (37.1 C)  SpO2: 96%      Assessment & Plan:    UTI/Dysuria -- UA results are skewed due to patient taking OTC AZO for urinary pain relief. Due to her symptoms and physical exam we will treat for UTI.  Advised to increase fluids, do good handwashing. She has many listed antibiotic allergies, but her reactions are mainly stomach upset. IM rocephin in clinic x1, Ceftin 500mg  PO BID for 5 days for UTI treatment. Administrations This Visit    cefTRIAXone (ROCEPHIN) injection 1 g    Admin  Date 04/25/2018 Action Given Dose 1 g Route Intramuscular Administered By Neta Ehlers, RMA         Patient has history of antibiotic associated colitis, patient states antibiotics tend to flareup her IBS.  Prescription for probiotics sent to take once daily for next few weeks to help combat this.  Also advised eating a daily yogurt can help, but patient does not like yogurt.  Keep regularly scheduled follow-up as planned.  Return to clinic sooner if issues arise.

## 2018-04-26 LAB — URINE CULTURE
MICRO NUMBER:: 91225725
SPECIMEN QUALITY:: ADEQUATE

## 2018-04-28 ENCOUNTER — Other Ambulatory Visit: Payer: Self-pay | Admitting: Internal Medicine

## 2018-04-28 ENCOUNTER — Encounter: Payer: Self-pay | Admitting: Pharmacist

## 2018-04-28 ENCOUNTER — Ambulatory Visit (INDEPENDENT_AMBULATORY_CARE_PROVIDER_SITE_OTHER): Payer: Medicare Other | Admitting: Pharmacist

## 2018-04-28 VITALS — BP 121/76 | HR 76 | Wt 253.4 lb

## 2018-04-28 DIAGNOSIS — E1142 Type 2 diabetes mellitus with diabetic polyneuropathy: Secondary | ICD-10-CM | POA: Diagnosis not present

## 2018-04-28 DIAGNOSIS — R350 Frequency of micturition: Secondary | ICD-10-CM

## 2018-04-28 MED ORDER — DULAGLUTIDE 1.5 MG/0.5ML ~~LOC~~ SOAJ: 1.5000 mg | SUBCUTANEOUS | 3 refills | Status: DC

## 2018-04-28 MED ORDER — INSULIN GLARGINE 100 UNIT/ML SOLOSTAR PEN: 11.0000 [IU] | PEN_INJECTOR | Freq: Every day | SUBCUTANEOUS | 3 refills | Status: DC

## 2018-04-28 MED ORDER — ROSUVASTATIN CALCIUM 10 MG PO TABS: 10.0000 mg | ORAL_TABLET | Freq: Every day | ORAL | 3 refills | Status: DC

## 2018-04-28 NOTE — Assessment & Plan Note (Signed)
#  Diabetes - Currently controlled per SMBG results; though symptoms of hypoglycemia with SMBG 80s. Goal A1c <7%. Patient reports adherence with medications. Will avoid initiation of SGLT2 agents d/t hx urinary incontinence. Appropriate to slightly reduce insulin dose to remain at goal but improve symptomatic hypotension - Decrease Lantus to 11 units daily - Continue Trulicity 1.5 mg weekly - Next A1C anticipated 05/2018.

## 2018-04-28 NOTE — Progress Notes (Signed)
ua

## 2018-04-28 NOTE — Patient Instructions (Addendum)
It was great to see you again today!  Let's back the Lantus down to 11 units every day and continue Trulicity 1.5 mg weekly. Keep checking fasting blood sugar every morning. If your fasting numbers creep up to being consistently >130 in the future, increase Lantus to 12 units daily.    [name] would like for you to come in on Wednesday to leave a urine culture, so that it is resulted when you see her on Friday.    I would be happy to see you again in the future if concerns arise. It was great working with you!  Catie Darnelle Maffucci, PharmD

## 2018-04-28 NOTE — Progress Notes (Signed)
S:     Chief Complaint  Patient presents with  . Medication Management    Diabetes    Patient arrives in good spirits with her husband, ambulating without assistance.  Presents for diabetes evaluation, education, and management at the request of [name] (referred on 01/29/2018). At last Pharmacy visit on 03/24/2018, we started Lantus at 10 units daily with instructions to titrate by 1 unit every 3 days until fasting SMBG consistently <130 mg/dL. She saw Philis Nettle, NP for an acute visit on 10/11 d/t burning on urination, increased urgency and frequency and was given ceftriaxone 1 g IM x1 and prescribed cefuroxime 500 mg BID x 5 days.   Today, she notes that she worked up to 13 units daily, but backed down to 12 units daily, as she was feeling poorly with SMBG in the 80s. She notes that, because of her recent UTI and antibiotic treatment, she has not been feeling well and has not eaten barely anything. She notes some vomiting, and has been taking an old prescription for ondansetron to help prevent nausea. She notes up to 20 loose/watery stools daily. She notes that she is staying well hydrated. She denies any episodes of hypoglycemia with SMBG <70 mg/dL.   Insurance coverage/medication affordability: [facility] Prescription Coverage  Patient reports adherence with medications.  Current diabetes medications include: Trulicity 1.5 mg weekly, Lantus 12-13 units daily  Patient notes some dizziness, shakiness, sweating. Patient denies hyperglycemic symptoms including polyuria, polydipsia, polyphagia, nocturia, neuropathy, blurred vision. Patient reports self foot exams.   Diet:  - Notes that they haven't been making sweet tea; hasn't been eating well over the past few weeks.   O:  Physical Exam  Constitutional: She appears well-developed and well-nourished.  Vitals reviewed.  Review of Systems  All other systems reviewed and are negative.   Lab Results  Component Value Date   HGBA1C 7.9 (H)  02/24/2018   Vitals:   04/28/18 1318  BP: 121/76  Pulse: 76    Basic Metabolic Panel BMP Latest Ref Rng & Units 02/24/2018 11/19/2017 09/19/2017  Glucose 70 - 99 mg/dL 156(H) 242(H) 211(H)  BUN 6 - 23 mg/dL 15 14 17   Creatinine 0.40 - 1.20 mg/dL 1.04 0.82 0.87  Sodium 135 - 145 mEq/L 138 139 139  Potassium 3.5 - 5.1 mEq/L 4.5 3.7 3.8  Chloride 96 - 112 mEq/L 101 103 102  CO2 19 - 32 mEq/L 28 26 26   Calcium 8.4 - 10.5 mg/dL 9.6 8.7 9.0     Lipid Panel     Component Value Date/Time   CHOL 193 02/24/2018 1159   CHOL 155 02/27/2014 0420   TRIG 197.0 (H) 02/24/2018 1159   TRIG 121 02/27/2014 0420   HDL 51.40 02/24/2018 1159   HDL 43 02/27/2014 0420   CHOLHDL 4 02/24/2018 1159   VLDL 39.4 02/24/2018 1159   VLDL 24 02/27/2014 0420   LDLCALC 102 (H) 02/24/2018 1159   LDLCALC 88 02/27/2014 0420   LDLDIRECT 111.0 06/16/2015 0936    Fasting SMBG: 80s-130s, mostly in 80s-90s since being on Lantus 12 units daily  Clinical ASCVD: No    A/P: Following discussion and approval by [name], the following medication changes were made:   #Diabetes - Currently controlled per SMBG results; though symptoms of hypoglycemia with SMBG 80s. Goal A1c <7%. Patient reports adherence with medications. Will avoid initiation of SGLT2 agents d/t hx urinary incontinence. Appropriate to slightly reduce insulin dose to remain at goal but improve symptomatic  hypotension - Decrease Lantus to 11 units daily - Continue Trulicity 1.5 mg weekly - Next A1C anticipated 05/2018.    #Urinary symptoms - Patient still notes complaints of decreased urination, and GI upset d/t antibiotics. She has an appointment scheduled with [name] on 10/18; - Dr [name] placed orders for urine culture to be collected on Wednesday to have results for her appointment on Friday.  Written patient instructions provided.  Total time in face to face counseling 45 minutes.    Follow up in Pharmacist Clinic Visit PRN.  De Hollingshead, PharmD PGY2 Ambulatory Care Pharmacy Resident Phone: [phone]   I have reviewed the above information and agree with above.   Deborra Medina, MD

## 2018-04-30 ENCOUNTER — Other Ambulatory Visit (INDEPENDENT_AMBULATORY_CARE_PROVIDER_SITE_OTHER): Payer: Medicare Other

## 2018-04-30 DIAGNOSIS — R350 Frequency of micturition: Secondary | ICD-10-CM | POA: Diagnosis not present

## 2018-04-30 LAB — URINALYSIS, ROUTINE W REFLEX MICROSCOPIC
Bilirubin Urine: NEGATIVE
Hgb urine dipstick: NEGATIVE
Ketones, ur: NEGATIVE
Nitrite: NEGATIVE
Specific Gravity, Urine: 1.025 (ref 1.000–1.030)
Total Protein, Urine: NEGATIVE
Urine Glucose: NEGATIVE
Urobilinogen, UA: 0.2 (ref 0.0–1.0)
pH: 5.5 (ref 5.0–8.0)

## 2018-05-01 ENCOUNTER — Ambulatory Visit: Payer: Medicare Other | Admitting: Internal Medicine

## 2018-05-02 ENCOUNTER — Ambulatory Visit (INDEPENDENT_AMBULATORY_CARE_PROVIDER_SITE_OTHER): Payer: Medicare Other | Admitting: Internal Medicine

## 2018-05-02 DIAGNOSIS — R635 Abnormal weight gain: Secondary | ICD-10-CM

## 2018-05-02 DIAGNOSIS — K521 Toxic gastroenteritis and colitis: Secondary | ICD-10-CM

## 2018-05-02 DIAGNOSIS — B952 Enterococcus as the cause of diseases classified elsewhere: Secondary | ICD-10-CM | POA: Diagnosis not present

## 2018-05-02 DIAGNOSIS — T3695XA Adverse effect of unspecified systemic antibiotic, initial encounter: Secondary | ICD-10-CM | POA: Diagnosis not present

## 2018-05-02 DIAGNOSIS — N390 Urinary tract infection, site not specified: Secondary | ICD-10-CM | POA: Diagnosis not present

## 2018-05-02 DIAGNOSIS — G4710 Hypersomnia, unspecified: Secondary | ICD-10-CM

## 2018-05-02 DIAGNOSIS — D3502 Benign neoplasm of left adrenal gland: Secondary | ICD-10-CM

## 2018-05-02 LAB — URINE CULTURE
MICRO NUMBER:: 91243989
SPECIMEN QUALITY:: ADEQUATE

## 2018-05-02 MED ORDER — PROBIOTIC COMPLEX ACIDOPHILUS PO CAPS: 1.0000 | ORAL_CAPSULE | Freq: Every day | ORAL | 3 refills | Status: DC

## 2018-05-02 NOTE — Progress Notes (Signed)
Subjective:  Patient ID: Denise Sharp, female    DOB: 22-Nov-1945  Age: 72 y.o. MRN: [id number]  CC: Diagnoses of Antibiotic-associated colitis, Weight gain, Adenoma of left adrenal gland, Hypersomnia, persistent, and UTI (urinary tract infection) due to Enterococcus were pertinent to this visit.  HPI Denise Sharp presents for follow up on multiple issues new and old.  1) morning tachycardia:  Accompanied by normal blood pressure.  feeling Short of breath,  Worried about her adrenal gland.  has had an adrenal adenoma for 8 years .  Has never been active metabolically.   Has appt with new urologist in the middle of  November.    2) Has OSA , last study was 2016.  New mask in July 2019 by pulmonology   Using it during the day if she takes a nap but not when watching televesion.    3) Fatigue .  No improvement despite use of CPAP   4) Still gaining  weight despite "eating very little"   No fluid retention   Not taking a diuretic but has CVI not wearing stockings    5) persistent dysuria, severe   Urine culture was negative on Oct 11,  repeat culture is still pending   6) pannus with recurrent yeast infections.   Planning to have panniculectomy and abdominoplasty   7)  Taking celexa at night   Using lunesta 3 mg and valium 5 mg as well    8) stools more solid with use of probiotic.   9) takign 11 units of Levemir .  BS  80 to 108.  Taking Trulicity as well.     Outpatient Medications Prior to Visit  Medication Sig Dispense Refill  . albuterol (PROVENTIL HFA;VENTOLIN HFA) 108 (90 Base) MCG/ACT inhaler Inhale 2 puffs into the lungs every 8 (eight) hours as needed for wheezing or shortness of breath. 1 Inhaler 0  . aspirin EC 81 MG tablet Take 1 tablet (81 mg total) by mouth daily. 90 tablet 3  . benzonatate (TESSALON) 200 MG capsule Take 1 capsule (200 mg total) by mouth 2 (two) times daily as needed for cough. 30 capsule 0  . Blood Glucose Monitoring Suppl (ONETOUCH VERIO)  w/Device KIT 1 Device by Does not apply route daily. Use to test blood glucose once daily. Dx code = E11.9 1 kit 0  . butalbital-acetaminophen-caffeine (FIORICET, ESGIC) 50-325-40 MG tablet Take 1 tablet by mouth every 6 (six) hours as needed for headache. 60 tablet 0  . citalopram (CELEXA) 10 MG tablet Take 1 tablet (10 mg total) by mouth daily. 90 tablet 3  . clotrimazole-betamethasone (LOTRISONE) cream Apply 1 application topically 2 (two) times daily. 45 g 3  . cyanocobalamin (,VITAMIN B-12,) 1000 MCG/ML injection Inject 1 ml (1000 mcg ) IM weekly x 4,  Then monthly thereafter (Patient taking differently: Inject 1 ml (1000 mcg ) IM monthly) 110 mL 0  . diazepam (VALIUM) 5 MG tablet Take 1 tablet (5 mg total) by mouth at bedtime as needed for anxiety. Related to CPAP use 30 tablet 5  . dicyclomine (BENTYL) 10 MG capsule Take 10 mg by mouth 4 (four) times daily -  before meals and at bedtime.    . Dulaglutide (TRULICITY) 1.5 VX/4.8AX SOPN Inject 1.5 mg into the skin once a week. 12 pen 3  . Eszopiclone 3 MG TABS Take 1 tablet (3 mg total) by mouth at bedtime. Take immediately before bedtime 90 tablet 0  . glucose blood test strip Use  to test blood sugar once daily. Dx code = E11.9 100 each 12  . Insulin Glargine (LANTUS SOLOSTAR) 100 UNIT/ML Solostar Pen Inject 11 Units into the skin daily. 5 pen 3  . Insulin Pen Needle 31G X 5 MM MISC Use to inject insulin daily 30 each 0  . Lancet Devices (ONE TOUCH DELICA LANCING DEV) MISC Use to test blood glucose once daily. Dx code = E11.9 1 each 0  . levETIRAcetam (KEPPRA) 500 MG tablet Take 1 tablet (500 mg total) by mouth at bedtime.    . NONFORMULARY OR COMPOUNDED [place]  Combination Pain Cream -  Baclofen 2%, Doxepin 5%, Gabapentin 6%, Topiramate 2%, Pentoxifylline 3% Apply 1-2 grams to affected area 3-4 times daily Qty. 120 gm 3 refills 1 each 3  . nystatin (NYAMYC) powder Apply topically 4 (four) times daily.    . ondansetron  (ZOFRAN) 4 MG tablet Take 1 tablet (4 mg total) by mouth every 8 (eight) hours as needed for nausea or vomiting. 10 tablet 0  . ONETOUCH DELICA LANCETS FINE MISC Use to test blood sguar once daily. Dx code = E11.9 100 each 3  . pregabalin (LYRICA) 100 MG capsule Take 100 mg by mouth daily.    . rosuvastatin (CRESTOR) 10 MG tablet Take 1 tablet (10 mg total) by mouth daily. 90 tablet 3  . Syringe/Needle, Disp, (SYRINGE 3CC/25GX1") 25G X 1" 3 ML MISC Use for b12 injections 50 each 0  . traMADol (ULTRAM) 50 MG tablet Take 0.5 tablets (25 mg total) by mouth every 12 (twelve) hours as needed for severe pain. 30 tablet 0  . Probiotic Product (PROBIOTIC COMPLEX ACIDOPHILUS) CAPS Take 1 capsule by mouth daily. 30 capsule 1   No facility-administered medications prior to visit.     Review of Systems;  Patient denies headache, fevers, malaise, unintentional weight loss, skin rash, eye pain, sinus congestion and sinus pain, sore throat, dysphagia,  hemoptysis , cough, dyspnea, wheezing, chest pain, palpitations, orthopnea, edema, abdominal pain, nausea, melena, diarrhea, constipation, flank pain, dysuria, hematuria, urinary  Frequency, nocturia, numbness, tingling, seizures,  Focal weakness, Loss of consciousness,  Tremor, insomnia, depression, anxiety, and suicidal ideation.      Objective:  BP 120/76 (BP Location: Left Arm, Patient Position: Sitting, Cuff Size: Normal)   Pulse 80   Temp 98.6 F (37 C) (Oral)   Resp 16   Ht '5\' 4"'  (1.626 m)   Wt 252 lb 12.8 oz (114.7 kg)   SpO2 97%   BMI 43.39 kg/m   BP Readings from Last 3 Encounters:  05/02/18 120/76  04/28/18 121/76  04/25/18 124/68    Wt Readings from Last 3 Encounters:  05/02/18 252 lb 12.8 oz (114.7 kg)  04/28/18 253 lb 6.4 oz (114.9 kg)  04/25/18 250 lb 9.6 oz (113.7 kg)    General appearance: alert, cooperative and appears stated age Neck: no adenopathy, no carotid bruit, supple, symmetrical, trachea midline and thyroid not  enlarged, symmetric, no tenderness/mass/nodules Back: symmetric, no curvature. ROM normal. No CVA tenderness. Lungs: clear to auscultation bilaterally Heart: regular rate and rhythm, S1, S2 normal, no murmur, click, rub or gallop Abdomen: soft, non-tender; bowel sounds normal; no masses,  no organomegaly Pulses: 2+ and symmetric Skin: resolving  erythema under pannus  Lymph nodes: Cervical, supraclavicular, and axillary nodes normal.  Lab Results  Component Value Date   HGBA1C 7.9 (H) 02/24/2018   HGBA1C 9.1 (H) 11/19/2017   HGBA1C 7.6 (H) 07/30/2017    Lab Results  Component Value Date   CREATININE 1.04 02/24/2018   CREATININE 0.82 11/19/2017   CREATININE 0.87 09/19/2017    Lab Results  Component Value Date   WBC 10.3 11/19/2017   HGB 11.9 (L) 11/19/2017   HCT 36.7 11/19/2017   PLT 286.0 11/19/2017   GLUCOSE 156 (H) 02/24/2018   CHOL 193 02/24/2018   TRIG 197.0 (H) 02/24/2018   HDL 51.40 02/24/2018   LDLDIRECT 111.0 06/16/2015   LDLCALC 102 (H) 02/24/2018   ALT 18 02/24/2018   AST 24 02/24/2018   NA 138 02/24/2018   K 4.5 02/24/2018   CL 101 02/24/2018   CREATININE 1.04 02/24/2018   BUN 15 02/24/2018   CO2 28 02/24/2018   TSH 2.279 08/20/2017   HGBA1C 7.9 (H) 02/24/2018   MICROALBUR 2.8 (H) 06/04/2017    Dg Shoulder Right  Result Date: 08/20/2017 CLINICAL DATA:  Right shoulder pain.  Fall EXAM: RIGHT SHOULDER - 2+ VIEW COMPARISON:  08/13/2017 FINDINGS: Prior right shoulder replacement. No hardware complicating feature or acute bony abnormality. No fracture, subluxation or dislocation. IMPRESSION: Prior right shoulder replacement.  No acute findings. Electronically Signed   By: Rolm Baptise M.D.   On: 08/20/2017 11:13   Ct Head Wo Contrast  Result Date: 08/19/2017 CLINICAL DATA:  Sudden mental status changes, currently unresponsive. EXAM: CT HEAD WITHOUT CONTRAST TECHNIQUE: Contiguous axial images were obtained from the base of the skull through the vertex without  intravenous contrast. COMPARISON:  02/17/2017 FINDINGS: Brain: Age related atrophy. Chronic appearing small vessel ischemic changes the hemispheric white matter. No sign of acute infarction, mass lesion, hemorrhage, hydrocephalus or extra-axial collection. Vascular: There is atherosclerotic calcification of the major vessels at the base of the brain. Skull: Normal Sinuses/Orbits: Clear/normal Other: None IMPRESSION: No acute finding by CT. Age related atrophy and mild chronic small-vessel ischemic change of the white matter. Electronically Signed   By: Nelson Chimes M.D.   On: 08/19/2017 20:41   Ct Angio Chest Pe W Or Wo Contrast  Result Date: 08/20/2017 CLINICAL DATA:  Elevated D-dimer.  Recent shoulder surgery. EXAM: CT ANGIOGRAPHY CHEST WITH CONTRAST TECHNIQUE: Multidetector CT imaging of the chest was performed using the standard protocol during bolus administration of intravenous contrast. Multiplanar CT image reconstructions and MIPs were obtained to evaluate the vascular anatomy. CONTRAST:  136m ISOVUE-370 IOPAMIDOL (ISOVUE-370) INJECTION 76% COMPARISON:  Chest radiograph, 08/19/2017.  Chest CTA, 12/07/2015. FINDINGS: Cardiovascular: Satisfactory opacification of the pulmonary arteries to the segmental level. No evidence of pulmonary embolism. Normal heart size. No pericardial effusion. Mild coronary artery calcifications. Great vessels normal in caliber. Aorta is not opacified. No aortic atherosclerotic calcifications. Mediastinum/Nodes: Visualized thyroid is unremarkable. No mediastinal or hilar masses. No pathologically enlarged lymph nodes. Trachea is widely patent. Esophagus is unremarkable. Lungs/Pleura: There is right lower lobe opacity most consistent with atelectasis. Minimal subsegmental atelectasis noted in the dependent left lower lobe. Lungs otherwise clear. Trace right pleural effusion. No left pleural effusion. No pneumothorax. Upper Abdomen: Changes from prior gastric surgery stable from  the prior CT. 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 No acute findings in the visible upper abdomen. Musculoskeletal: There is edema surrounding the new right shoulder prosthesis. No acute fracture. No osteoblastic or osteolytic lesions. Review of the MIP images confirms the above findings. IMPRESSION: 1. No evidence of a pulmonary embolism. 2. Right lower lobe atelectasis and minimal subsegmental left lower lobe atelectasis. No convincing pneumonia. No pulmonary edema. Trace right pleural effusion. Electronically Signed  By: Lajean Manes M.D.   On: 08/20/2017 17:19   Dg Chest Portable 1 View  Result Date: 08/19/2017 CLINICAL DATA:  Recent shoulder surgery. Altered mental status today. EXAM: PORTABLE CHEST 1 VIEW COMPARISON:  06/13/2017. FINDINGS: Poor inspiration. Allowing for that, the heart and mediastinum are normal in the lungs are clear. No collapse or visible infiltrate. Reverse shoulder replacement on the right. IMPRESSION: Poor inspiration.  No active disease suspected. Electronically Signed   By: Nelson Chimes M.D.   On: 08/19/2017 20:35    Assessment & Plan:   Problem List Items Addressed This Visit    Adenoma of left adrenal gland (Chronic)    Has never been metabolically active,  In the setting of persistent weight gain and morning tachycardia,  Reassurance provided,  Follow up with urology for annual surveillance.       Hypersomnia, persistent    Suspect Pickwickian syndrome given body habitus,  OSA.  Recommended repeat CPAP titration study given morning tachycardia       UTI (urinary tract infection) due to Enterococcus    Repeat UA and culture drawn on Wednesday is positive ,  Suggesting that her current persistent symptoms are due to Enterococcus faecalis .  Macrobid prescribed for 7 days       Weight gain    Persistent,  Despite a reported lack of appetite,  Due to lack of any exercise.  Addressed today again.         Other Visit Diagnoses     Antibiotic-associated colitis       Relevant Medications   Probiotic Product (PROBIOTIC COMPLEX ACIDOPHILUS) CAPS      I am having Tia Masker. Sharlett Iles maintain her albuterol, butalbital-acetaminophen-caffeine, aspirin EC, ondansetron, traMADol, nystatin, pregabalin, cyanocobalamin, SYRINGE 3CC/25GX1", NONFORMULARY OR COMPOUNDED ITEM, benzonatate, glucose blood, ONE TOUCH DELICA LANCING DEV, ONETOUCH DELICA LANCETS FINE, ONETOUCH VERIO, diazepam, Eszopiclone, dicyclomine, clotrimazole-betamethasone, citalopram, levETIRAcetam, Insulin Pen Needle, Insulin Glargine, rosuvastatin, Dulaglutide, and PROBIOTIC COMPLEX ACIDOPHILUS.  Meds ordered this encounter  Medications  . Probiotic Product (PROBIOTIC COMPLEX ACIDOPHILUS) CAPS    Sig: Take 1 capsule by mouth daily.    Dispense:  90 capsule    Refill:  3    Medications Discontinued During This Encounter  Medication Reason  . Probiotic Product (PROBIOTIC COMPLEX ACIDOPHILUS) CAPS Reorder    Follow-up: No follow-ups on file.   [name], MD

## 2018-05-02 NOTE — Patient Instructions (Addendum)
I do not think your rapid heart rate is coming from your adrenal gland.  I recommend getting your exercise back from [place] using it daily to burn calories Daily exercise will also make your heart rate less variable because you will be less deconditioned Purchasing a pulse oximeter from [employer] will be helpful so you can measure your oxygen level when your heart rate is high to see if your oxygen is dropping    I am still waiting on the results of your urine culture. If it confirms infection,  i will  Call in an antibiotic   To your local pharmacy  .

## 2018-05-03 MED ORDER — NITROFURANTOIN MONOHYD MACRO 100 MG PO CAPS: 100.0000 mg | ORAL_CAPSULE | Freq: Two times a day (BID) | ORAL | 0 refills | Status: DC

## 2018-05-03 NOTE — Addendum Note (Signed)
Addended by: [name] on: 05/03/2018 09:42 PM   Modules accepted: Orders

## 2018-05-04 ENCOUNTER — Encounter: Payer: Self-pay | Admitting: Internal Medicine

## 2018-05-04 DIAGNOSIS — B952 Enterococcus as the cause of diseases classified elsewhere: Secondary | ICD-10-CM | POA: Insufficient documentation

## 2018-05-04 DIAGNOSIS — N390 Urinary tract infection, site not specified: Secondary | ICD-10-CM | POA: Insufficient documentation

## 2018-05-04 NOTE — Assessment & Plan Note (Signed)
Has never been metabolically active,  In the setting of persistent weight gain and morning tachycardia,  Reassurance provided,  Follow up with urology for annual surveillance.

## 2018-05-04 NOTE — Assessment & Plan Note (Addendum)
Repeat UA and culture drawn on Wednesday is positive ,  Suggesting that her current persistent symptoms are due to Enterococcus faecalis .  Macrobid prescribed for 7 days

## 2018-05-04 NOTE — Assessment & Plan Note (Signed)
Suspect Pickwickian syndrome given body habitus,  OSA.  Recommended repeat CPAP titration study given morning tachycardia

## 2018-05-04 NOTE — Assessment & Plan Note (Signed)
Persistent,  Despite a reported lack of appetite,  Due to lack of any exercise.  Addressed today again.

## 2018-05-05 ENCOUNTER — Encounter: Payer: Self-pay | Admitting: Podiatry

## 2018-05-05 ENCOUNTER — Ambulatory Visit (INDEPENDENT_AMBULATORY_CARE_PROVIDER_SITE_OTHER): Payer: Medicare Other | Admitting: Podiatry

## 2018-05-05 DIAGNOSIS — M79676 Pain in unspecified toe(s): Secondary | ICD-10-CM

## 2018-05-05 DIAGNOSIS — B351 Tinea unguium: Secondary | ICD-10-CM | POA: Diagnosis not present

## 2018-05-05 DIAGNOSIS — M129 Arthropathy, unspecified: Secondary | ICD-10-CM

## 2018-05-05 DIAGNOSIS — E1149 Type 2 diabetes mellitus with other diabetic neurological complication: Secondary | ICD-10-CM

## 2018-05-05 NOTE — Progress Notes (Signed)
Complaint:  Visit Type: Patient returns to my office for continued preventative foot care services. Complaint: Patient states" my nails have grown long and thick and become painful to walk and wear shoes" Patient has been diagnosed with DM with no foot complications. The patient presents for preventative foot care services. No changes to ROS.  Patient says she has  no feeling in her feet.   Podiatric Exam: Vascular: dorsalis pedis and posterior tibial pulses are palpable bilateral. Capillary return is immediate. Temperature gradient is WNL. Skin turgor WNL  Sensorium: Absent  Semmes Weinstein monofilament test. Normal tactile sensation bilaterally. Nail Exam: Pt has thick disfigured discolored nails with subungual debris noted bilateral entire nail hallux through fifth toenails Ulcer Exam: There is no evidence of ulcer or pre-ulcerative changes or infection. Orthopedic Exam: Muscle tone and strength are WNL. No limitations in general ROM. No crepitus or effusions noted. Foot type and digits show no abnormalities. Bony prominences are unremarkable. 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  Overlapping second digit left foot. Skin: No Porokeratosis. No infection or ulcers  Diagnosis:  Onychomycosis, , Pain in right toe, pain in left toes Diabetes with neuropathy  Treatment & Plan Procedures and Treatment: Consent by patient was obtained for treatment procedures. The patient understood the discussion of treatment and procedures well. All questions were answered thoroughly reviewed. Debridement of mycotic and hypertrophic toenails, 1 through 5 bilateral and clearing of subungual debris. No ulceration, no infection noted.  To talk to [place] about her shoes. Return Visit-Office Procedure: Patient instructed to return to the office for a follow up visit 3 months for continued evaluation and treatment.    Gardiner Barefoot DPM

## 2018-05-09 ENCOUNTER — Ambulatory Visit: Payer: Self-pay

## 2018-05-09 MED ORDER — VANCOMYCIN HCL 125 MG PO CAPS: 125.0000 mg | ORAL_CAPSULE | Freq: Four times a day (QID) | ORAL | 0 refills | Status: DC

## 2018-05-09 NOTE — Telephone Encounter (Signed)
Pt stated that the she developed blisters in her mouth and on her lip last night. Pt stated that she is also itchy all over but has no rash. Pt believes that it is a reaction to the macrobid that was prescribed for UTI. Pt stated that she is still having dysuria, voiding small amounts and low abdominal pain. Pt is wanting to know if a different antibiotic can be called in.

## 2018-05-09 NOTE — Telephone Encounter (Signed)
There are no other choices except vancomycin,  ,   Because of her her drug allergies and the sensitivities of the organism causing her UTI.  I agree with stopping the Macrobid,  And I can call in the vancomycin but it has to be taken 4 times daily   For 3 days

## 2018-05-09 NOTE — Telephone Encounter (Signed)
Has blisters inside her mouth and one blister on her lower lip. Started last night.States the blister on the outside of her lip looks like a "fever blister." She is "itchy all over - no rash."  Still having dysuria, voiding small amounts, low abdominal pain. Feels like the blisters are coming from the [place]. Wants to know if [name] can change the antibiotic and prescribe A mouthwash for the blisters. Please advise pt. Answer Assessment - Initial Assessment Questions 1. SYMPTOM: "What's the main symptom you're concerned about?" (e.g., dry mouth. chapped lips, lump)     Blisters inside mouth and one sore on lower lip. 2. ONSET: "When did the  blisters  start?"     Started today 3. PAIN: "Is there any pain?" If so, ask: "How bad is it?" (Scale: 1-10; mild, moderate, severe)     Moderate 4. CAUSE: "What do you think is causing the symptoms?"     Antibiotic - mACROBID 5. OTHER SYMPTOMS: "Do you have any other symptoms?" (e.g., fever, sore throat, toothache, swelling)     No 6. PREGNANCY: "Is there any chance you are pregnant?" "When was your last menstrual period?"     No  Protocols used: MOUTH [facility]

## 2018-05-09 NOTE — Telephone Encounter (Signed)
Spoke with pt and informed her of the medication change and that [facility]. Pt gave a verbal understanding.

## 2018-05-22 DIAGNOSIS — E113393 Type 2 diabetes mellitus with moderate nonproliferative diabetic retinopathy without macular edema, bilateral: Secondary | ICD-10-CM | POA: Diagnosis not present

## 2018-05-29 DIAGNOSIS — F419 Anxiety disorder, unspecified: Secondary | ICD-10-CM | POA: Diagnosis not present

## 2018-05-29 DIAGNOSIS — I10 Essential (primary) hypertension: Secondary | ICD-10-CM | POA: Diagnosis not present

## 2018-05-29 DIAGNOSIS — N390 Urinary tract infection, site not specified: Secondary | ICD-10-CM | POA: Diagnosis not present

## 2018-05-29 DIAGNOSIS — Z7982 Long term (current) use of aspirin: Secondary | ICD-10-CM | POA: Diagnosis not present

## 2018-05-29 DIAGNOSIS — N3281 Overactive bladder: Secondary | ICD-10-CM | POA: Diagnosis not present

## 2018-05-29 DIAGNOSIS — Z6841 Body Mass Index (BMI) 40.0 and over, adult: Secondary | ICD-10-CM | POA: Diagnosis not present

## 2018-05-29 DIAGNOSIS — Z794 Long term (current) use of insulin: Secondary | ICD-10-CM | POA: Diagnosis not present

## 2018-05-29 DIAGNOSIS — Z79899 Other long term (current) drug therapy: Secondary | ICD-10-CM | POA: Diagnosis not present

## 2018-05-29 DIAGNOSIS — E65 Localized adiposity: Secondary | ICD-10-CM | POA: Diagnosis not present

## 2018-05-29 DIAGNOSIS — E119 Type 2 diabetes mellitus without complications: Secondary | ICD-10-CM | POA: Diagnosis not present

## 2018-05-30 ENCOUNTER — Telehealth: Payer: Self-pay | Admitting: Internal Medicine

## 2018-05-30 NOTE — Telephone Encounter (Signed)
Copied from [place] [id number]. Topic: Quick Communication - Rx Refill/Question >> May 30, 2018  4:14 PM [name], [name] wrote: Medication: lunesta  Has the patient contacted their pharmacy? Yes.   (Agent: If no, request that the patient contact the pharmacy for the refill.) (Agent: If yes, when and what did the pharmacy advise?)  Preferred Pharmacy (with phone number or street name): [facility] Pt phone number [phone]   Agent: Please be advised that RX refills may take up to 3 business days. We ask that you follow-up with your pharmacy.  Pt states this was discussed at last vist, and should have been sent to [place] Pt only has few days supply left

## 2018-05-30 NOTE — Telephone Encounter (Signed)
Requested medication (s) are due for refill today: yes  Requested medication (s) are on the active medication list: yes  Last refill:  12/26/17 #90  Future visit scheduled: yes  Notes to clinic:  Unable to fill per protocol    Requested Prescriptions  Pending Prescriptions Disp Refills   Eszopiclone 3 MG TABS 90 tablet 0    Sig: Take 1 tablet (3 mg total) by mouth at bedtime. Take immediately before bedtime     Not Delegated - Psychiatry:  Anxiolytics/Hypnotics Failed - 05/30/2018  6:30 PM      Failed - This refill cannot be delegated      Failed - Urine Drug Screen completed in last 360 days.      Passed - Valid encounter within last 6 months    Recent Outpatient Visits          4 weeks ago Antibiotic-associated colitis   [place] [name], MD   1 month ago DM type 2 with diabetic peripheral neuropathy [facility])   [place], [place], [place]   1 month ago Acute cystitis without hematuria   [place] Guse, Jacquelynn Cree, FNP   2 months ago DM type 2 with diabetic peripheral neuropathy [facility])   [facility] [place], [place]   3 months ago DM type 2 with diabetic peripheral neuropathy [facility])   [facility] [place], [facility]      Future Appointments            In 6 months [name], Aris Everts, MD [place], [place]   In 6 months O[place], [place], [place], [place]   In 8 months Deneise Lever, MD [facility]

## 2018-06-02 ENCOUNTER — Other Ambulatory Visit: Payer: Self-pay | Admitting: Internal Medicine

## 2018-06-02 MED ORDER — ESZOPICLONE 3 MG PO TABS: 3.0000 mg | ORAL_TABLET | Freq: Every day | ORAL | 1 refills | Status: DC

## 2018-06-02 NOTE — Telephone Encounter (Signed)
Refilled: 12/26/2017 Last OV: 05/02/2018 Next OV: 12/25/2018

## 2018-06-06 DIAGNOSIS — H2513 Age-related nuclear cataract, bilateral: Secondary | ICD-10-CM | POA: Diagnosis not present

## 2018-06-06 DIAGNOSIS — H02839 Dermatochalasis of unspecified eye, unspecified eyelid: Secondary | ICD-10-CM | POA: Diagnosis not present

## 2018-06-06 DIAGNOSIS — E119 Type 2 diabetes mellitus without complications: Secondary | ICD-10-CM | POA: Diagnosis not present

## 2018-06-06 DIAGNOSIS — H18413 Arcus senilis, bilateral: Secondary | ICD-10-CM | POA: Diagnosis not present

## 2018-06-10 NOTE — Telephone Encounter (Signed)
Please advise 

## 2018-06-10 NOTE — Telephone Encounter (Signed)
Patient is calling to confirm that this was sent into the [facility]. I show the order class as print. Please advise and contact patient to confirm.

## 2018-06-11 NOTE — Telephone Encounter (Signed)
Spoke with pt and informed her that the rx was faxed to [facility] on 11/819/2019. Pt stated that she called them yesterday and they stated that the rx was in the mail.

## 2018-06-17 DIAGNOSIS — H43813 Vitreous degeneration, bilateral: Secondary | ICD-10-CM | POA: Diagnosis not present

## 2018-06-26 ENCOUNTER — Telehealth: Payer: Self-pay | Admitting: Internal Medicine

## 2018-06-26 ENCOUNTER — Ambulatory Visit: Payer: Self-pay

## 2018-06-26 DIAGNOSIS — H269 Unspecified cataract: Secondary | ICD-10-CM | POA: Insufficient documentation

## 2018-06-26 DIAGNOSIS — H259 Unspecified age-related cataract: Secondary | ICD-10-CM

## 2018-06-26 NOTE — Telephone Encounter (Signed)
Patient is having Cataract surgery Mon, 06/30/18 and she wants to know if she should have her flu shot before the surgery. Advised pt to wait due to potential side effects of the flu shot.   Reason for Disposition . General information question, no triage required and triager able to answer question  Answer Assessment - Initial Assessment Questions 1. REASON FOR CALL or QUESTION: "What is your reason for calling today?" or "How can I best help you?" or "What question do you have that I can help answer?"     Patient is having Cataract surgery Mon, 06/30/18 and she wants to know if she should have her flu shot before the surgery  Protocols used: INFORMATION ONLY CALL-A-AH

## 2018-06-26 NOTE — Telephone Encounter (Signed)
FYI

## 2018-06-26 NOTE — Telephone Encounter (Signed)
Pt wanted Dr [name] to know that she is having cataract surgery December 16 on the right eye and on December 30 on the left eye. Dr Talbert Forest will be doing the procedure.

## 2018-06-30 DIAGNOSIS — H2511 Age-related nuclear cataract, right eye: Secondary | ICD-10-CM | POA: Diagnosis not present

## 2018-07-01 DIAGNOSIS — H2512 Age-related nuclear cataract, left eye: Secondary | ICD-10-CM | POA: Diagnosis not present

## 2018-07-14 ENCOUNTER — Telehealth: Payer: Self-pay | Admitting: Internal Medicine

## 2018-07-14 DIAGNOSIS — H2512 Age-related nuclear cataract, left eye: Secondary | ICD-10-CM | POA: Diagnosis not present

## 2018-07-14 NOTE — Telephone Encounter (Signed)
Copied from [place] [id number]. Topic: General - Other >> Jul 14, 2018  2:39 PM Lennox Solders wrote: Reason for CRM: pt needs new rx diazepam 5 mg . Please [facility] to [facility] also needs a refill on ondansetron 4 mg. Pt had  cataract surgery today and now she is having nausea. [facility] [address]

## 2018-07-15 ENCOUNTER — Telehealth: Payer: Self-pay | Admitting: Internal Medicine

## 2018-07-15 MED ORDER — DIAZEPAM 5 MG PO TABS: 5.0000 mg | ORAL_TABLET | Freq: Every evening | ORAL | 5 refills | Status: DC | PRN

## 2018-07-15 MED ORDER — ONDANSETRON HCL 4 MG PO TABS: 4.0000 mg | ORAL_TABLET | Freq: Three times a day (TID) | ORAL | 0 refills | Status: DC | PRN

## 2018-07-15 NOTE — Telephone Encounter (Signed)
Patient called back stating that the medication diazepam (VALIUM) 5 MG tablet was supposed to be sent to [facility]. Patient is requesting this be resent. Please advise.   Pharmacy:  [facility] [place], [place] - [address] (Phone) [phone] (Fax)

## 2018-07-15 NOTE — Telephone Encounter (Signed)
Corrected,  Cancelled locally and sent to mail order via fax / \\MyChart message sent

## 2018-07-15 NOTE — Telephone Encounter (Signed)
Please advise 

## 2018-07-15 NOTE — Telephone Encounter (Signed)
The nausea medication needs to go to [facility]

## 2018-07-15 NOTE — Telephone Encounter (Signed)
Both refilled and sent to [facility]

## 2018-07-15 NOTE — Telephone Encounter (Signed)
Diazepam   Refilled: 12/26/2017 Last OV: 05/02/2018 Next OV: 12/24/2017  Pt is also requesting a rx for zofran. Pt stated that she had cataract surgery and is now having some nausea.

## 2018-07-17 DIAGNOSIS — H43812 Vitreous degeneration, left eye: Secondary | ICD-10-CM | POA: Diagnosis not present

## 2018-07-17 DIAGNOSIS — N390 Urinary tract infection, site not specified: Secondary | ICD-10-CM | POA: Diagnosis not present

## 2018-07-24 ENCOUNTER — Other Ambulatory Visit: Payer: Self-pay | Admitting: Internal Medicine

## 2018-07-24 MED ORDER — INSULIN PEN NEEDLE 31G X 5 MM MISC: 1 refills | Status: DC

## 2018-07-24 NOTE — Telephone Encounter (Signed)
Copied from [place] [phone] [phone]. Topic: General - Other >> Jul 24, 2018 10:39 AM Lennox Solders wrote: Reason for CRM: pt is calling and needs refills on lantus solostar and trulicity 12 pens w/3 refills [facility] to [facility]. Pt said the office sends the refill

## 2018-07-24 NOTE — Telephone Encounter (Signed)
Requested Prescriptions  Pending Prescriptions Disp Refills  . Insulin Pen Needle 31G X 5 MM MISC 30 each 1    Sig: Use to inject insulin daily     Endocrinology: Diabetes - Testing Supplies Passed - 07/24/2018 11:07 AM      Passed - Valid encounter within last 12 months    Recent Outpatient Visits          2 months ago Antibiotic-associated colitis   [facility] [place] [name], MD   2 months ago DM type 2 with diabetic peripheral neuropathy [facility])   [facility] [place], [place]   3 months ago Acute cystitis without hematuria   [place] Guse, Jacquelynn Cree, FNP   4 months ago DM type 2 with diabetic peripheral neuropathy [facility])   [facility] [place], [place]   5 months ago DM type 2 with diabetic peripheral neuropathy [facility])   [facility] [place], [facility]      Future Appointments            In 5 months [name], Aris Everts, MD [place], [place]   In 5 months O'Brien-Blaney, Bryson Corona, [place], [place]   In 7 months Young, Kasandra Knudsen, MD [facility]

## 2018-07-28 DIAGNOSIS — Z6841 Body Mass Index (BMI) 40.0 and over, adult: Secondary | ICD-10-CM | POA: Diagnosis not present

## 2018-07-28 DIAGNOSIS — M170 Bilateral primary osteoarthritis of knee: Secondary | ICD-10-CM | POA: Diagnosis not present

## 2018-07-28 DIAGNOSIS — M19011 Primary osteoarthritis, right shoulder: Secondary | ICD-10-CM | POA: Diagnosis not present

## 2018-08-04 ENCOUNTER — Ambulatory Visit (INDEPENDENT_AMBULATORY_CARE_PROVIDER_SITE_OTHER): Payer: Medicare Other | Admitting: Podiatry

## 2018-08-04 ENCOUNTER — Encounter: Payer: Self-pay | Admitting: Podiatry

## 2018-08-04 DIAGNOSIS — M129 Arthropathy, unspecified: Secondary | ICD-10-CM

## 2018-08-04 DIAGNOSIS — M79676 Pain in unspecified toe(s): Secondary | ICD-10-CM | POA: Diagnosis not present

## 2018-08-04 DIAGNOSIS — E1149 Type 2 diabetes mellitus with other diabetic neurological complication: Secondary | ICD-10-CM | POA: Diagnosis not present

## 2018-08-04 DIAGNOSIS — E11610 Type 2 diabetes mellitus with diabetic neuropathic arthropathy: Secondary | ICD-10-CM | POA: Diagnosis not present

## 2018-08-04 DIAGNOSIS — B351 Tinea unguium: Secondary | ICD-10-CM

## 2018-08-04 NOTE — Progress Notes (Addendum)
Complaint:  Visit Type: Patient returns to my office for continued preventative foot care services. Complaint: Patient states" my nails have grown long and thick and become painful to walk and wear shoes" Patient has been diagnosed with DM with no foot complications. The patient presents for preventative foot care services. No changes to ROS.  Patient says she has  Severe throbbing pain on the outside of her left foot.  She points to the fofth metabase.  Podiatric Exam: Vascular: dorsalis pedis and posterior tibial pulses are palpable bilateral. Capillary return is immediate. Temperature gradient is WNL. Skin turgor WNL  Sensorium: Absent  Semmes Weinstein monofilament test. Normal tactile sensation bilaterally. Nail Exam: Pt has thick disfigured discolored nails with subungual debris noted bilateral entire nail hallux through fifth toenails Ulcer Exam: There is no evidence of ulcer or pre-ulcerative changes or infection. Orthopedic Exam: Muscle tone and strength are WNL. No limitations in general ROM. No crepitus or effusions noted. Foot type and digits show no abnormalities. Bony prominences are unremarkable. 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 Hallux malleus  B/L.  Prominent fifth metabase  B/L. Overlapping second digit left foot. Skin: No Porokeratosis. No infection or ulcers  Diagnosis:  Onychomycosis, , Pain in right toe, pain in left toes Diabetes with neuropathy  Treatment & Plan Procedures and Treatment: Consent by patient was obtained for treatment procedures. The patient understood the discussion of treatment and procedures well. All questions were answered thoroughly reviewed. Debridement of mycotic and hypertrophic toenails, 1 through 5 bilateral and clearing of subungual debris. No ulceration, no infection noted.  To talk to [facility] acquiring shoes to prevent the constant throbbing and pain fifth meta base left foot.  ABN signed for 2020. Return Visit-Office Procedure: Patient  instructed to return to the office for a follow up visit 3 months for continued evaluation and treatment.    Gardiner Barefoot DPM

## 2018-08-08 ENCOUNTER — Encounter: Payer: Self-pay | Admitting: Neurology

## 2018-08-08 ENCOUNTER — Ambulatory Visit (INDEPENDENT_AMBULATORY_CARE_PROVIDER_SITE_OTHER): Payer: Medicare Other | Admitting: Neurology

## 2018-08-08 ENCOUNTER — Other Ambulatory Visit: Payer: Self-pay

## 2018-08-08 VITALS — BP 118/75 | HR 68 | Ht 64.0 in | Wt 237.0 lb

## 2018-08-08 DIAGNOSIS — G8929 Other chronic pain: Secondary | ICD-10-CM | POA: Diagnosis not present

## 2018-08-08 DIAGNOSIS — M542 Cervicalgia: Secondary | ICD-10-CM | POA: Diagnosis not present

## 2018-08-08 MED ORDER — LEVETIRACETAM 750 MG PO TABS: 750.0000 mg | ORAL_TABLET | Freq: Every day | ORAL | 3 refills | Status: DC

## 2018-08-08 NOTE — Progress Notes (Signed)
Reason for visit: Memory disturbance, peripheral neuropathy, neck pain and headache, dizziness  Denise Sharp is an 73 y.o. female  History of present illness:  Denise Sharp is a 73 year old right-handed white female with a history of multiple somatic complaints.  The patient has diabetes and a diabetic neuropathy.  She is having some pain in the left foot that she calls cramps, but she indicates the episode may come on with sharp pain that goes up the leg and may last for up to 24 hours.  She claims that Ultram will help the pain.  The patient takes Keppra 500 mg at night for her neuropathy pain, she might take Lyrica 100 mg at night if the Keppra is not effective.  The patient also indicates that over the last 3 months she has had increased problems with neck pain and headaches and dizziness.  The patient walks with a cane, she may fall on occasion, she indicates that she generally does not fall as long she is using her cane.  The patient continues to note problems with short-term memory that has been present since her hospitalization in February 2019.  The patient has never regained her normal mental status since that time.  The patient is driving a car some, she may get lost with driving, she may have trouble keeping up with appointments.  She has had a CT scan of the brain that shows a moderate level of small vessel ischemic changes.  She comes to this office for an evaluation.  Past Medical History:  Diagnosis Date  . Abnormality of gait 03/25/2013  . Adrenal mass, left ([place])   . Anemia    iron deficient post  2 unit txfsn 2009, normal endo/colonoscopy by [facility]  . Arthritis   . Atypical chest pain    a. 04/1986 Cath ([facility]): nl cors, EF 65%; b. 07/2012 Cath [facility]): LM nl, LAD/LCX/RCA w/ min irregs; c. 07/2016 [facility]): "Equivocal" w/ small inf wall rev defect and hyperdynamic LV contraction, EF 87%; d. 08/2016 Cardiac CT Ca2+ score [facility]): Ca2+ score 1548.  [name] Cervical spinal stenosis  1994   due to trauma to back ([employer] accident), has intermittent paralysis and parasthesias  . Cervicogenic headache 03/23/2014  . Depression   . Dizziness    chronic dizziness  . DJD (degenerative joint disease)    a. Chronic R shoulder pain pending R shoulder replacement 07/2017.  [name] Esophageal stenosis March 2011   with transietn outlet obstruction by food, cleared by EGD   . Family history of adverse reaction to anesthesia    daughter PONV  . Gastric bypass status for obesity   . Headache(784.0)   . Hypertension   . IBS (irritable bowel syndrome)   . Left bundle branch block (LBBB)    a. Intermittently present - likely rate related. - patient denies  . Obesity   . Obstructive sleep apnea    using CPAP  . 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 Restless leg syndrome   . Rotator cuff arthropathy, right 08/13/2017  . Syncope and collapse 03/12/2014  . Type II diabetes mellitus ([place])     Past Surgical History:  Procedure Laterality Date  . ABDOMINAL HYSTERECTOMY    . APPENDECTOMY    . COLONOSCOPY    . DIAPHRAGMATIC HERNIA REPAIR  2015  . ESOPHAGEAL DILATION     multiple  . ESOPHAGOGASTRODUODENOSCOPY    . [place]  . GALLBLADDER SURGERY  resection  . GASTRIC BYPASS    .  GASTRIC BYPASS  2000, 2005   Dr. Debroah Loop  . [facility] IMPLANT PLACEMENT  April 2013   [place]  . JOINT REPLACEMENT  2007   bilateral knee. [name],  [name]  . REVERSE SHOULDER ARTHROPLASTY Right 08/13/2017   Procedure: REVERSE RIGHT SHOULDER ARTHROPLASTY;  Surgeon: [name], MD;  Location: [place];  Service: Orthopedics;  Laterality: Right;  . ROTATOR CUFF REPAIR     right  . [place]  . TOTAL ABDOMINAL HYSTERECTOMY W/ BILATERAL SALPINGOOPHORECTOMY  1974  . UMBILICAL HERNIA REPAIR  Aug 11, 2015    Family History  Problem Relation Age of Onset  . Heart disease Father   . Hypertension Father   . Prostate cancer Father   . Stroke Father   . Osteoporosis  Father   . Stroke Mother   . Depression Mother   . Headache Mother   . Heart disease Mother   . Thyroid disease Mother   . Hypertension Mother   . Diabetes Daughter   . Heart disease Daughter   . Hypertension Daughter   . Hypertension Son     Social history:  reports that she has never smoked. She has never used smokeless tobacco. She reports that she does not drink alcohol or use drugs.    Allergies  Allergen Reactions  . Biaxin [Clarithromycin] Nausea And Vomiting and Other (See Comments)    Severe irritable bowel  . Amoxicillin Diarrhea and Other (See Comments)    .PATIENT HAS HAD A PCN REACTION WITH IMMEDIATE RASH, FACIAL/TONGUE/THROAT SWELLING, SOB, OR LIGHTHEADEDNESS WITH HYPOTENSION:  #  #  #  YES  #  #  #   Has patient had a PCN reaction causing severe rash involving mucus membranes or skin necrosis: No Has patient had a PCN reaction that required hospitalization: No Has patient had a PCN reaction occurring within the last 10 years: #  #  #  YES  #  #  #  TOLERATED cefazolin pre-op 08/13/17  . Demeclocycline Hives  . Erythromycin Nausea And Vomiting and Other (See Comments)    Severe irritable bowel  . Flagyl [Metronidazole] Nausea And Vomiting and Other (See Comments)    Severe irritable bowel  . Glucophage [Metformin Hcl] Nausea And Vomiting and Other (See Comments)    "Sick" "I won't take anything that has metformin in it"  . Tetracyclines & Related Hives and Rash  . Diovan [Valsartan] Nausea Only       . Sulfa Antibiotics Rash and Other (See Comments)    As child  . Xanax [Alprazolam] Other (See Comments)    UNSPECIFIED REACTION, unknown    Medications:  Prior to Admission medications   Medication Sig Start Date End Date Taking? Authorizing Provider  albuterol (PROVENTIL HFA;VENTOLIN HFA) 108 (90 Base) MCG/ACT inhaler Inhale 2 puffs into the lungs every 8 (eight) hours as needed for wheezing or shortness of breath. 05/29/16  Yes Burnard Hawthorne, FNP    aspirin EC 81 MG tablet Take 1 tablet (81 mg total) by mouth daily. 03/06/17  Yes End, Harrell Gave, MD  benzonatate (TESSALON) 200 MG capsule Take 1 capsule (200 mg total) by mouth 2 (two) times daily as needed for cough. 11/19/17  Yes [name], MD  Blood Glucose Monitoring Suppl (ONETOUCH VERIO) w/Device KIT 1 Device by Does not apply route daily. Use to test blood glucose once daily. Dx code = E11.9 12/16/17  Yes [name], MD  butalbital-acetaminophen-caffeine (FIORICET, ESGIC) [phone] MG  tablet Take 1 tablet by mouth every 6 (six) hours as needed for headache. 03/05/17  Yes [name], MD  clotrimazole-betamethasone (LOTRISONE) cream Apply 1 application topically 2 (two) times daily. 01/24/18  Yes Gae Dry, MD  cyanocobalamin (,VITAMIN B-12,) 1000 MCG/ML injection Inject 1 ml (1000 mcg ) IM weekly x 4,  Then monthly thereafter Patient taking differently: Inject 1 ml (1000 mcg ) IM monthly 09/23/17  Yes [name], MD  diazepam (VALIUM) 5 MG tablet Take 1 tablet (5 mg total) by mouth at bedtime as needed for anxiety. 07/15/18  Yes [name], MD  dicyclomine (BENTYL) 10 MG capsule Take 10 mg by mouth 4 (four) times daily -  before meals and at bedtime.   Yes [provider]  Dulaglutide (TRULICITY) 1.5 AO/1.3YQ SOPN Inject 1.5 mg into the skin once a week. 04/28/18  Yes [name], MD  Eszopiclone 3 MG TABS Take 1 tablet (3 mg total) by mouth at bedtime. 06/02/18  Yes [name], MD  glucose blood test strip Use to test blood sugar once daily. Dx code = E11.9 12/16/17  Yes [name], MD  Insulin Glargine (LANTUS SOLOSTAR) 100 UNIT/ML Solostar Pen Inject 11 Units into the skin daily. 04/28/18  Yes [name], MD  Insulin Pen Needle 31G X 5 MM MISC Use to inject insulin daily 07/24/18  Yes [name], MD  Lancet Devices (ONE TOUCH DELICA LANCING DEV) MISC Use to test blood glucose once daily. Dx code = E11.9 12/16/17  Yes [name], MD   levETIRAcetam (KEPPRA) 500 MG tablet Take 1 tablet (500 mg total) by mouth at bedtime. 02/05/18  Yes Kathrynn Ducking, MD  NONFORMULARY OR COMPOUNDED [place]  Combination Pain Cream -  Baclofen 2%, Doxepin 5%, Gabapentin 6%, Topiramate 2%, Pentoxifylline 3% Apply 1-2 grams to affected area 3-4 times daily Qty. 120 gm 3 refills 10/14/17  Yes Gardiner Barefoot, DPM  nystatin [facility]) powder Apply topically 4 (four) times daily.   Yes [provider]  ondansetron (ZOFRAN) 4 MG tablet Take 1 tablet (4 mg total) by mouth every 8 (eight) hours as needed for nausea or vomiting. 07/15/18  Yes [name], MD  [facility] DELICA LANCETS FINE MISC Use to test blood sguar once daily. Dx code = E11.9 12/16/17  Yes [name], MD  pregabalin (LYRICA) 100 MG capsule Take 100 mg by mouth daily.   Yes [provider]  rosuvastatin (CRESTOR) 10 MG tablet Take 1 tablet (10 mg total) by mouth daily. 04/28/18  Yes [name], MD  Syringe/Needle, Disp, (SYRINGE 3CC/25GX1") 25G X 1" 3 ML MISC Use for b12 injections 09/23/17  Yes [name], MD  traMADol (ULTRAM) 50 MG tablet Take 0.5 tablets (25 mg total) by mouth every 12 (twelve) hours as needed for severe pain. 08/21/17  Yes Regalado, Belkys A, MD    ROS:  Out of a complete 14 system review of symptoms, the patient complains only of the following symptoms, and all other reviewed systems are negative.  Decreased activity, decreased appetite, fatigue, excessive sweating Eye discharge Cough, shortness of breath, choking Palpitations of the heart Swollen abdomen, diarrhea Restless legs, insomnia, frequent waking, daytime sleepiness Joint pain, joint swelling, aching muscles, muscle cramps, walking difficulty, neck pain, neck stiffness Memory loss, dizziness, headache, weakness, tremors, facial drooping Confusion, decreased concentration  Blood pressure 118/75, pulse 68, height '5\' 4"'  (1.626 m), weight 237 lb (107.5  kg).  Physical Exam  General: The patient is alert and cooperative at the time of the examination.  The patient is markedly obese.  Neuromuscular: The patient has significant restriction with turning the head to the left, she lacks about 30 degrees of rotational movement turning to the right.  Skin: 1-2+ edema below the knees is seen bilaterally.   Neurologic Exam  Mental status: The patient is alert and oriented x 3 at the time of the examination. The patient has apparent normal recent and remote memory, with an apparently normal attention span and concentration ability.  Mini-Mental status examination done today shows a total score of 29/30.   Cranial nerves: Facial symmetry is present. Speech is normal, no aphasia or dysarthria is noted. Extraocular movements are full. Visual fields are full.  Motor: The patient has good strength in all 4 extremities.  Sensory examination: Soft touch sensation is symmetric on the face, arms, and legs.  Coordination: The patient has good finger-nose-finger and heel-to-shin bilaterally.  Gait and station: The patient has a slightly wide-based gait, the patient normally walks with a cane.  Tandem gait was not attempted.  Romberg is negative.  Reflexes: Deep tendon reflexes are symmetric, but are depressed.   Assessment/Plan:  1.  Peripheral neuropathy, diabetic  2.  Left leg pain  3.  Chronic neck pain  4.  Chronic dizziness  5.  Cervicogenic headache  6.  Gait disorder  7.  Memory disorder  The patient is having ongoing problems with the memory, she is relatively stable in the Mini-Mental status examination.  She reports that her memory change following her episode of a coma in February 2019 has been persistent.  The patient is also noting a parallel between increased neck pain, headache, and dizziness.  She does have small vessel disease by CT of the head.  She is on several psychoactive medications that could potentially result in  dizziness.  The patient will undergo a CT scan of the cervical spine, we will consider an epidural steroid injection.  The memory issue will be followed over time.  The patient will follow-up in about 6 months.  The Keppra dosing will be increased to 750 mg at night.  A prescription was sent in.  Jill Alexanders MD 08/08/2018 11:13 AM  [facility] [address] [place], [place] [address]  Phone [phone] Fax [phone]

## 2018-08-08 NOTE — Patient Instructions (Signed)
We will go up on the Keppra 750 mg at night.

## 2018-08-11 ENCOUNTER — Telehealth: Payer: Self-pay | Admitting: Neurology

## 2018-08-11 NOTE — Telephone Encounter (Signed)
lvm for pt to be aware  I left GI phone number of [phone] and to give GI a call if she has not heard from them in the next 2-3 business days.

## 2018-08-11 NOTE — Telephone Encounter (Signed)
medicare/BC[employer] supp/[place] order sent to GI. They will reach out to the pt to schedule.

## 2018-08-14 LAB — HEMOGLOBIN A1C: Hemoglobin A1C: 6.5

## 2018-08-15 LAB — CBC AND DIFFERENTIAL
HCT: 45 (ref 36–46)
Hemoglobin: 13.9 (ref 12.0–16.0)
Platelets: 364 (ref 150–399)
WBC: 12.5

## 2018-08-15 LAB — HEPATIC FUNCTION PANEL
ALT: 18 (ref 7–35)
AST: 25 (ref 13–35)
Alkaline Phosphatase: 152 — AB (ref 25–125)
Bilirubin, Total: 0.4

## 2018-08-15 LAB — BASIC METABOLIC PANEL
Creatinine: 0.9 (ref <=1.1)
Glucose: 93
Potassium: 4.3 (ref 3.4–5.3)
Sodium: 145 (ref 137–147)

## 2018-08-18 ENCOUNTER — Telehealth: Payer: Self-pay | Admitting: *Deleted

## 2018-08-18 NOTE — Telephone Encounter (Signed)
Copied from [place] [id number]. Topic: General - Inquiry >> Aug 18, 2018 12:59 PM Margot Ables wrote: Reason for CRM: Dr. Myriam Jacobson at [facility] is sending surgical clearance form to [name]. Pt called and scheduled OV for surgical clearance 08/21/2018. Please notify pt if you do not received the form today.

## 2018-08-19 NOTE — Telephone Encounter (Signed)
Spoke with pt and she stated that it was not a surgical clearance form that was being faxed over it was her lab work that she had done. I let pt know that we did receive that. Pt gave a verbal understanding and stated that she would be here 08/21/2018 for her Physical/surgical clearance.

## 2018-08-20 ENCOUNTER — Telehealth: Payer: Self-pay | Admitting: Neurology

## 2018-08-20 ENCOUNTER — Ambulatory Visit
Admission: RE | Admit: 2018-08-20 | Discharge: 2018-08-20 | Disposition: A | Discharge status: Home / Self Care | Payer: Medicare Other | Source: Ambulatory Visit | Attending: Neurology | Admitting: Neurology

## 2018-08-20 ENCOUNTER — Ambulatory Visit: Payer: Medicare Other | Admitting: Orthotics

## 2018-08-20 DIAGNOSIS — E1149 Type 2 diabetes mellitus with other diabetic neurological complication: Secondary | ICD-10-CM

## 2018-08-20 DIAGNOSIS — M79676 Pain in unspecified toe(s): Secondary | ICD-10-CM

## 2018-08-20 DIAGNOSIS — G8929 Other chronic pain: Secondary | ICD-10-CM

## 2018-08-20 DIAGNOSIS — M542 Cervicalgia: Principal | ICD-10-CM

## 2018-08-20 DIAGNOSIS — B351 Tinea unguium: Secondary | ICD-10-CM

## 2018-08-20 DIAGNOSIS — M4802 Spinal stenosis, cervical region: Secondary | ICD-10-CM | POA: Diagnosis not present

## 2018-08-20 DIAGNOSIS — E11610 Type 2 diabetes mellitus with diabetic neuropathic arthropathy: Secondary | ICD-10-CM

## 2018-08-20 NOTE — Telephone Encounter (Signed)
I called the patient.  I discussed the results of the CT scan of the cervical spine.  The patient is going be going for major abdominal surgery, I suggested getting an epidural steroid injection but we may need to hold off on this until after she has recovered from the surgery.  CT cervical 08/20/18:  IMPRESSION: Multilevel spondylosis. No cervical spine fracture or traumatic subluxation.  Correlate clinically for LEFT arm symptomatology related to facet arthropathy, worst at C6-7 on the LEFT, or uncinate spurring, noted at C5-6 bilaterally.

## 2018-08-21 ENCOUNTER — Encounter: Payer: Self-pay | Admitting: Internal Medicine

## 2018-08-21 ENCOUNTER — Ambulatory Visit (INDEPENDENT_AMBULATORY_CARE_PROVIDER_SITE_OTHER): Payer: Medicare Other | Admitting: Internal Medicine

## 2018-08-21 VITALS — BP 106/66 | HR 63 | Temp 98.0°F | Resp 15 | Ht 64.0 in | Wt 236.6 lb

## 2018-08-21 DIAGNOSIS — Z01810 Encounter for preprocedural cardiovascular examination: Secondary | ICD-10-CM | POA: Diagnosis not present

## 2018-08-21 DIAGNOSIS — E65 Localized adiposity: Secondary | ICD-10-CM

## 2018-08-21 DIAGNOSIS — Z23 Encounter for immunization: Secondary | ICD-10-CM | POA: Diagnosis not present

## 2018-08-21 DIAGNOSIS — Z01818 Encounter for other preprocedural examination: Secondary | ICD-10-CM

## 2018-08-21 NOTE — Progress Notes (Signed)
Subjective:  Patient ID: Denise Sharp, female    DOB: 05-29-1946  Age: 73 y.o. MRN: [id number]  CC: The primary encounter diagnosis was Preoperative cardiovascular examination. Diagnoses of Encounter for immunization, Pannus, abdominal, and Preoperative clearance were also pertinent to this visit.  HPI Denise Sharp presents for Preoperative medical clearance, requested by her plastic  surgeon,  For upcoming abdominoplasty  which is medically necessary due to multiple recurrent /chronic conditions that have been caused or aggravated by her abdominal pannus,  Including recurrent cellulitis and candidiasis of the lower abdominal wall.  The surgery is to be done at [facility] .  The procedure is expected to take around 4 hours,  And require a ten day hospital stay followed by 4 weeks in [facility] facility for wound care and OT. The surgery is to be done by Dr. Myriam Jacobson.    She has a  history of nonobstructive CAD by prior cardiac catheterization in 2014 as well as well controlled type  2 DM,  Fatty liver,  And OSA on CPAP.  She denies any  recent episodes of chest pain and had a preoperative  cardiac eval in Dec 2018  By Dr Harrell Gave End prior to an orthopedic surgery.  He noted very elevated coronary calcium score  of 1548 In Feb 2018  Which was done following an equivocal stress test that suggested a small area of reversibility in the inferior wall . Medical management was recommended with beta blocker,  Aspirin and statin therapy  Since then she is no longer taking beta blocker due to recurrent hypotension..  T2DM: Had bilateral cataract surgery [facility] in [place] in December. .    She has DM Type 2 which has been historically well controlled  and not complicated by nephropathy , neuropathy , or PAD.  . managed with insulin and Truliciy>  Her N4B has improved to 6.5   Currently taking 11 units   Of Lantus  And once weekly Trulicity      Outpatient Medications Prior to Visit  Medication Sig  Dispense Refill  . albuterol (PROVENTIL HFA;VENTOLIN HFA) 108 (90 Base) MCG/ACT inhaler Inhale 2 puffs into the lungs every 8 (eight) hours as needed for wheezing or shortness of breath. 1 Inhaler 0  . aspirin EC 81 MG tablet Take 1 tablet (81 mg total) by mouth daily. 90 tablet 3  . benzonatate (TESSALON) 200 MG capsule Take 1 capsule (200 mg total) by mouth 2 (two) times daily as needed for cough. 30 capsule 0  . Blood Glucose Monitoring Suppl (ONETOUCH VERIO) w/Device KIT 1 Device by Does not apply route daily. Use to test blood glucose once daily. Dx code = E11.9 1 kit 0  . butalbital-acetaminophen-caffeine (FIORICET, ESGIC) 50-325-40 MG tablet Take 1 tablet by mouth every 6 (six) hours as needed for headache. 60 tablet 0  . clotrimazole-betamethasone (LOTRISONE) cream Apply 1 application topically 2 (two) times daily. 45 g 3  . cyanocobalamin (,VITAMIN B-12,) 1000 MCG/ML injection Inject 1 ml (1000 mcg ) IM weekly x 4,  Then monthly thereafter (Patient taking differently: Inject 1 ml (1000 mcg ) IM monthly) 110 mL 0  . diazepam (VALIUM) 5 MG tablet Take 1 tablet (5 mg total) by mouth at bedtime as needed for anxiety. 30 tablet 5  . Dulaglutide (TRULICITY) 1.5 SJ/6.2EZ SOPN Inject 1.5 mg into the skin once a week. 12 pen 3  . Eszopiclone 3 MG TABS Take 1 tablet (3 mg total) by mouth at bedtime.  90 tablet 1  . glucose blood test strip Use to test blood sugar once daily. Dx code = E11.9 100 each 12  . Insulin Glargine (LANTUS SOLOSTAR) 100 UNIT/ML Solostar Pen Inject 11 Units into the skin daily. 5 pen 3  . Insulin Pen Needle 31G X 5 MM MISC Use to inject insulin daily 30 each 1  . Lancet Devices (ONE TOUCH DELICA LANCING DEV) MISC Use to test blood glucose once daily. Dx code = E11.9 1 each 0  . levETIRAcetam (KEPPRA) 750 MG tablet Take 1 tablet (750 mg total) by mouth at bedtime. 90 tablet 3  . NONFORMULARY OR COMPOUNDED [place]  Combination Pain Cream -  Baclofen 2%, Doxepin 5%,  Gabapentin 6%, Topiramate 2%, Pentoxifylline 3% Apply 1-2 grams to affected area 3-4 times daily Qty. 120 gm 3 refills 1 each 3  . nystatin (NYAMYC) powder Apply topically 4 (four) times daily.    . ondansetron (ZOFRAN) 4 MG tablet Take 1 tablet (4 mg total) by mouth every 8 (eight) hours as needed for nausea or vomiting. 20 tablet 0  . ONETOUCH DELICA LANCETS FINE MISC Use to test blood sguar once daily. Dx code = E11.9 100 each 3  . pregabalin (LYRICA) 100 MG capsule Take 100 mg by mouth daily.    . rosuvastatin (CRESTOR) 10 MG tablet Take 1 tablet (10 mg total) by mouth daily. 90 tablet 3  . Syringe/Needle, Disp, (SYRINGE 3CC/25GX1") 25G X 1" 3 ML MISC Use for b12 injections 50 each 0  . traMADol (ULTRAM) 50 MG tablet Take 0.5 tablets (25 mg total) by mouth every 12 (twelve) hours as needed for severe pain. 30 tablet 0  . dicyclomine (BENTYL) 10 MG capsule Take 10 mg by mouth 4 (four) times daily -  before meals and at bedtime.     No facility-administered medications prior to visit.     Review of Systems;  Patient denies headache, fevers, malaise, unintentional weight loss, skin rash, eye pain, sinus congestion and sinus pain, sore throat, dysphagia,  hemoptysis , cough, dyspnea, wheezing, chest pain, palpitations, orthopnea, edema, abdominal pain, nausea, melena, diarrhea, constipation, flank pain, dysuria, hematuria, urinary  Frequency, nocturia, numbness, tingling, seizures,  Focal weakness, Loss of consciousness,  Tremor, insomnia, depression, anxiety, and suicidal ideation.      Objective:  BP 106/66 (BP Location: Left Arm, Patient Position: Sitting, Cuff Size: Normal)   Pulse 63   Temp 98 F (36.7 C) (Oral)   Resp 15   Ht '5\' 4"'  (1.626 m)   Wt 236 lb 9.6 oz (107.3 kg)   SpO2 97%   BMI 40.61 kg/m   BP Readings from Last 3 Encounters:  08/21/18 106/66  08/08/18 118/75  05/02/18 120/76    Wt Readings from Last 3 Encounters:  08/21/18 236 lb 9.6 oz (107.3 kg)  08/08/18  237 lb (107.5 kg)  05/02/18 252 lb 12.8 oz (114.7 kg)    General appearance: alert, cooperative and appears stated age Ears: normal TM's and external ear canals both ears Throat: lips, mucosa, and tongue normal; teeth and gums normal Neck: no adenopathy, no carotid bruit, supple, symmetrical, trachea midline and thyroid not enlarged, symmetric, no tenderness/mass/nodules Back: symmetric, no curvature. ROM normal. No CVA tenderness. Lungs: clear to auscultation bilaterally Heart: regular rate and rhythm, S1, S2 normal, no murmur, click, rub or gallop Abdomen: soft, non-tender; bowel sounds normal; no masses,  no organomegaly Pulses: 2+ and symmetric Skin: Skin color, texture, turgor normal. No rashes or  lesions Lymph nodes: Cervical, supraclavicular, and axillary nodes normal.  Lab Results  Component Value Date   HGBA1C 6.5 08/14/2018   HGBA1C 7.9 (H) 02/24/2018   HGBA1C 9.1 (H) 11/19/2017    Lab Results  Component Value Date   CREATININE 1.04 02/24/2018   CREATININE 0.82 11/19/2017   CREATININE 0.87 09/19/2017    Lab Results  Component Value Date   WBC 10.3 11/19/2017   HGB 11.9 (L) 11/19/2017   HCT 36.7 11/19/2017   PLT 286.0 11/19/2017   GLUCOSE 156 (H) 02/24/2018   CHOL 193 02/24/2018   TRIG 197.0 (H) 02/24/2018   HDL 51.40 02/24/2018   LDLDIRECT 111.0 06/16/2015   LDLCALC 102 (H) 02/24/2018   ALT 18 02/24/2018   AST 24 02/24/2018   NA 138 02/24/2018   K 4.5 02/24/2018   CL 101 02/24/2018   CREATININE 1.04 02/24/2018   BUN 15 02/24/2018   CO2 28 02/24/2018   TSH 2.279 08/20/2017   HGBA1C 6.5 08/14/2018   MICROALBUR 2.8 (H) 06/04/2017    Ct Cervical Spine Wo Contrast  Result Date: 08/20/2018 CLINICAL DATA:  [place] years ago. Pain and LEFT arm numbness. Headaches. EXAM: CT CERVICAL SPINE WITHOUT CONTRAST TECHNIQUE: Multidetector CT imaging of the cervical spine was performed without intravenous contrast. Multiplanar CT image reconstructions were also generated.  COMPARISON:  None. FINDINGS: Alignment: Preserved cervical lordosis except for trace anterolisthesis C4-5 and C7-T1, facet mediated. Skull base and vertebrae: No acute fracture. No primary bone lesion or focal pathologic process. Soft tissues and spinal canal: No prevertebral fluid or swelling. No visible canal hematoma. Carotid atherosclerosis. Disc levels: C2-3:  Facet arthropathy. No impingement. C3-4: Facet arthropathy. Minor uncinate spurring to the RIGHT. No impingement. C4-5:  Facet arthropathy. Trace anterolisthesis. No impingement. C5-6: Disc space narrowing. Disc osteophyte complex. Uncinate spurring. BILATERAL C6 foraminal narrowing. C6-7:  LEFT-sided facet arthropathy. No impingement. C7-T1:  Trace anterolisthesis. No impingement. Upper chest: No mass or pneumothorax. Other: None. IMPRESSION: Multilevel spondylosis. No cervical spine fracture or traumatic subluxation. Correlate clinically for LEFT arm symptomatology related to facet arthropathy, worst at C6-7 on the LEFT, or uncinate spurring, noted at C5-6 bilaterally. Electronically Signed   By: Staci Righter M.D.   On: 08/20/2018 14:17    Assessment & Plan:   Problem List Items Addressed This Visit    Preoperative clearance    She is considered moderate risk due to asymptomatic CAD,  OSA on CPAP, DM  (well controlled).  12 Lead EKG reviewed by me today is unchanged from prior.  Labs have apparently been drawn by her surgeon and her a1c and prealbumin are at goal.          Pannus, abdominal    Secondary to morbid obesity and prior bariatric surgery with resulting weight loss .  She has frequent intertrigonal infections and UTs  due to body habitus and would benefit from an abdominoplasty/pannectomy.  She is aware of the risks of the surgery and is determined to have this done. .        Other Visit Diagnoses    Preoperative cardiovascular examination    -  Primary   Relevant Orders   EKG 12-Lead (Completed)   Encounter for  immunization       Relevant Orders   Flu vaccine HIGH DOSE PF (Completed)      Denise Sharp. Denise Sharp maintain her albuterol, butalbital-acetaminophen-caffeine, aspirin EC, traMADol, nystatin, pregabalin, cyanocobalamin, SYRINGE 3CC/25GX1", NONFORMULARY OR COMPOUNDED ITEM, benzonatate, glucose blood, ONE TOUCH  DELICA LANCING DEV, ONETOUCH DELICA LANCETS FINE, ONETOUCH VERIO, dicyclomine, clotrimazole-betamethasone, Insulin Glargine, rosuvastatin, Dulaglutide, Eszopiclone, ondansetron, diazepam, Insulin Pen Needle, and levETIRAcetam.  No orders of the defined types were placed in this encounter.   There are no discontinued medications.  Follow-up: No follow-ups on file.   [name], MD

## 2018-08-21 NOTE — Patient Instructions (Signed)
Your preoperative risk is mildly elevated due to your coronary artery disease.  I am recommending to Dr Myriam Jacobson that your beta blocker be resumed at the time of your surgery to protect your heart   I also recommend that you continue to use insulin,  And meal time insulin will likely be used during the hospitalization to achieve greater control  Don't forget to take your CPAP machine with you and let the anesthesiologist know that you use it when you see him/her.

## 2018-08-23 ENCOUNTER — Encounter: Payer: Self-pay | Admitting: Internal Medicine

## 2018-08-23 DIAGNOSIS — Z01818 Encounter for other preprocedural examination: Secondary | ICD-10-CM | POA: Insufficient documentation

## 2018-08-23 NOTE — Assessment & Plan Note (Addendum)
Secondary to morbid obesity and prior bariatric surgery with resulting weight loss .  She has frequent intertrigonal infections and UTs  due to body habitus and would benefit from an abdominoplasty/pannectomy.  She is aware of the risks of the surgery and is determined to have this done. [name]

## 2018-08-23 NOTE — Assessment & Plan Note (Addendum)
She is considered moderate risk due to asymptomatic CAD,  OSA on CPAP, DM  (well controlled).  12 Lead EKG reviewed by me today is unchanged from prior.  Labs have apparently been drawn by her surgeon and her a1c and prealbumin are at goal.

## 2018-09-01 ENCOUNTER — Telehealth: Payer: Self-pay

## 2018-09-01 NOTE — Telephone Encounter (Signed)
Copied from [place] [phone]. Topic: General - Other >> Sep 01, 2018  4:33 PM Ivar Drape wrote: Reason for CRM:   Patient had a letter written for surgery clearance for [facility], Dr. Curtis Sites but they will not accept it.  They need the words stated in the letter that the patient had a physical exam and an EKG and there were no problems.  So she is cleared for surgery.  Patient will be in for a Pneumonia shot on Weds 09/03/2018 and she would like to pick up the revised letter then.

## 2018-09-02 NOTE — Telephone Encounter (Signed)
Called pt to let her know that [name] is out of the office until next Monday and that we would not be able to get her a letter until she returns. Pt stated that she has to have the letter by this Wednesday. I explained to the pt that all we could do is print off the office note from her appt for preoperative clearance. Pt stated that she is okay with pt. I double checked with Dr. Nicki Reaper to make sure that this was okay. Dr. Nicki Reaper gave the okay to print the office note.

## 2018-09-03 ENCOUNTER — Ambulatory Visit (INDEPENDENT_AMBULATORY_CARE_PROVIDER_SITE_OTHER): Payer: Medicare Other

## 2018-09-03 ENCOUNTER — Ambulatory Visit: Payer: Medicare Other

## 2018-09-03 DIAGNOSIS — Z23 Encounter for immunization: Secondary | ICD-10-CM | POA: Diagnosis not present

## 2018-09-03 NOTE — Progress Notes (Signed)
Pt came in for nurse visit administered pneaumovax and gave office notes to pt per request.  Denise Sharp

## 2018-09-03 NOTE — Telephone Encounter (Signed)
Office notes printed. Pt aware. She is going to come in today around 2-3 to pick up. We can give PNA shot today while she is here so she does not have to make a second trip over here tomorrow. Santiago Glad, let me know when she gets here.

## 2018-09-03 NOTE — Telephone Encounter (Signed)
Patient calling to check the status of the notes being printed for her to pick up and take to [facility]? Would like to get this done today since there is a chance of snow tomorrow.

## 2018-09-04 ENCOUNTER — Ambulatory Visit: Payer: Medicare Other

## 2018-09-05 ENCOUNTER — Telehealth: Payer: Self-pay | Admitting: Internal Medicine

## 2018-09-05 NOTE — Telephone Encounter (Signed)
° °  [facility] Pre-operative Risk Assessment    Request for surgical clearance:  1. What type of surgery is being performed? Non urgent abdominoplasty     2. When is this surgery scheduled? TBD   3. What type of clearance is required (medical clearance vs. Pharmacy clearance to hold med vs. Both)? Medical clearance for low cardiac risk  4. Are there any medications that need to be held prior to surgery and how long? None listed   5. Practice name and name of physician performing surgery? [facility] [place], Dr Jeani Hawking Damitz   6. What is your office phone number [phone]     7.   What is your office fax number [phone]  8.   Anesthesia type (None, local, MAC, general) ? 4 hours of general    Denise Sharp 09/05/2018, 4:12 PM  _________________________________________________________________   (provider comments below)

## 2018-09-08 NOTE — Telephone Encounter (Signed)
   Primary Cardiologist:Christopher End, MD  Chart reviewed as part of pre-operative protocol coverage. Because of Denise Sharp's past medical history and time since last visit, last seen over a year ago, he/she will require a follow-up visit in order to better assess preoperative cardiovascular risk.  Pre-op covering staff: - Please schedule appointment and call patient to inform them. - Please contact requesting surgeon's office via preferred method (i.e, phone, fax) to inform them of need for appointment prior to surgery.  If applicable, this message will also be routed to pharmacy pool and/or primary cardiologist for input on holding anticoagulant/antiplatelet agent as requested below so that this information is available at time of patient's appointment.   Daune Perch, NP  09/08/2018, 10:06 AM

## 2018-09-08 NOTE — Telephone Encounter (Signed)
1st attempt.Hulen Skains pt re: surgical clearance. Left a detailed message that pt would need to make an appt before clearance can be taken care of and to call back and schedule.

## 2018-09-09 ENCOUNTER — Telehealth: Payer: Self-pay | Admitting: Internal Medicine

## 2018-09-09 NOTE — Telephone Encounter (Signed)
Pt is scheduled to see Ignacia Bayley, NP on 10/06/2018, clearance will be addressed at visit.

## 2018-09-09 NOTE — Telephone Encounter (Signed)
Copied from [place] [id number]. Topic: General - Other >> Sep 09, 2018 10:32 AM Lennox Solders wrote: Reason for CRM: pt is calling and she is taking trulicity every Wednesday, however pt skip taking her med this past Wednesday. Pt does have IBS and since she skipped  trulicity this past wed her stools are better. Pt was having severe diarrhea. Pt friend did some research and trulicity does cause severe diarrhea. Pt will try to see her GI specialist at [place] next month. Pt had gastric bypass and will have excess skin removed on October 20 2018. Pt would like to see if  she can try another med that does same thing as  trulicity. Pt  A1C 6.5. Pt BS this morning 118 before breakfast. Pt said if md change medication [facility] [address] [facility] [place]  And if  it permanent change medication needs to  goes to [facility]. Please advise

## 2018-09-10 NOTE — Telephone Encounter (Signed)
Forwarding message  to [place] for alternatives t Trulicity

## 2018-09-11 NOTE — Telephone Encounter (Signed)
Could try the lower dose of Trulicity. Would still like to avoid SGLT2 due to history of recurrent genitourinary infections. History of intolerance to metformin. Would like to avoid pioglitazone to avoid fluid weight gain. Any other GLP1s would likely cause the same effects. If patient unwilling to do any of these things, could just plan to titrate insulin as needed.   Contacted patient, left message for her to call me back.   Catie Darnelle Maffucci, PharmD, [place] PGY2 Ambulatory Care Pharmacy Resident, [place] Network Phone: [phone]

## 2018-09-11 NOTE — Telephone Encounter (Signed)
Patient returned my call. After discussing, she noted that she does not think that her diarrhea is any better without Trulicity therapy. We discussed trying the lower dose or holding Trulicity and increasing insulin, and she has decided to restart Trulicity at the 1.5 mg dose. She notes that she has follow up with GI next month.   Encouraged her to call clinic back if she has a significant increase in GI symptoms once she restarts the Trulicity.   Catie Darnelle Maffucci, PharmD, [place] PGY2 Ambulatory Care Pharmacy Resident, [place] Network Phone: [phone]

## 2018-09-16 DIAGNOSIS — H43812 Vitreous degeneration, left eye: Secondary | ICD-10-CM | POA: Diagnosis not present

## 2018-09-22 ENCOUNTER — Other Ambulatory Visit: Payer: Self-pay | Admitting: *Deleted

## 2018-09-23 NOTE — Patient Outreach (Signed)
[facility]) Care Management  09/23/2018  Denise Sharp 1946-06-24 [id number]   CSW attempted to reach pt and was unsuccessful. CSW left voice message and will attempt outreach again in 3-4 business days  Eduard Clos, MSW, [place] Worker  [place] [phone]

## 2018-09-25 ENCOUNTER — Other Ambulatory Visit: Payer: Self-pay | Admitting: Internal Medicine

## 2018-09-25 MED ORDER — BUTALBITAL-APAP-CAFFEINE 50-325-40 MG PO TABS: 1.0000 | ORAL_TABLET | Freq: Four times a day (QID) | ORAL | 0 refills | Status: DC | PRN

## 2018-09-25 MED ORDER — CYANOCOBALAMIN 1000 MCG/ML IJ SOLN: INTRAMUSCULAR | 0 refills | Status: DC

## 2018-09-25 NOTE — Telephone Encounter (Signed)
Copied from [place] [phone]. Topic: General - Other >> Sep 25, 2018 12:32 PM Lennox Solders wrote: Reason for CRM:  pt is calling and needs refill on vit b 12  1000 Mcg injection and Fioricet 50-325-40 . Pt needs 90 day supply of each medication. Pt needs rxs to go to [facility]

## 2018-09-25 NOTE — Telephone Encounter (Signed)
Please note that pt. Is requesting both Rx's to go to [facility].

## 2018-09-25 NOTE — Telephone Encounter (Signed)
Please read message below. Also need to verify which [place] the pt would like to use, 2 are listed in med profile.

## 2018-09-25 NOTE — Telephone Encounter (Signed)
Requested medication (s) are due for refill today:  yes  Requested medication (s) are on the active medication list:  yes  Future visit scheduled:  yes  Last Refill: Cyancobalamin 09/23/17; # 110 cc; no refills                   Butalbital-Acetaminophen-Caffeine 03/05/17 #60; no refills (reported she got a previous refill from Dr. Jannifer Franklin)       Requested Prescriptions  Pending Prescriptions Disp Refills   cyanocobalamin (,VITAMIN B-12,) 1000 MCG/ML injection 110 mL 0    Sig: Inject 1 ml (1000 mcg ) IM weekly x 4,  Then monthly thereafter     Off-Protocol Failed - 09/25/2018  1:04 PM      Failed - Medication not assigned to a protocol, review manually.      Passed - Valid encounter within last 12 months    Recent Outpatient Visits          1 month ago Preoperative cardiovascular examination   [place] [name], MD   4 months ago Antibiotic-associated colitis   [place] [name], MD   5 months ago DM type 2 with diabetic peripheral neuropathy [facility])   [facility] [place], [place]   5 months ago Acute cystitis without hematuria   [place] Guse, Jacquelynn Cree, FNP   6 months ago DM type 2 with diabetic peripheral neuropathy [facility])   [place], [facility]      Future Appointments            In 3 months [name], Aris Everts, MD [place], [place]   In 3 months [place], [place], [place], [place]   In 4 months Young, Kasandra Knudsen, MD [facility]          butalbital-acetaminophen-caffeine (FIORICET, ESGIC) 50-325-40 MG tablet 60 tablet 0    Sig: Take 1 tablet by mouth every 6 (six) hours as needed for headache.     Not Delegated - Analgesics:  Non-Opioid Analgesic Combinations Failed - 09/25/2018  1:04 PM      Failed - This refill cannot be delegated      Passed - Valid encounter within  last 12 months    Recent Outpatient Visits          1 month ago Preoperative cardiovascular examination   [place] [name], MD   4 months ago Antibiotic-associated colitis   [place] [name], MD   5 months ago DM type 2 with diabetic peripheral neuropathy [facility])   [facility] [place], [place]   5 months ago Acute cystitis without hematuria   [place], FNP   6 months ago DM type 2 with diabetic peripheral neuropathy [facility])   [facility] [place], [facility]      Future Appointments            In 3 months [name], Aris Everts, MD [place], [place]   In 3 months O[place]Brien[place]Blaney, [place], [place], [place]   In 4 months Deneise Lever, MD [facility]

## 2018-09-29 ENCOUNTER — Ambulatory Visit: Payer: Self-pay

## 2018-09-29 NOTE — Telephone Encounter (Signed)
  Pt called stating that she has flu symptoms  She states they started  Last week with mild symptoms .  Today she has muscle aches headache and dry cough.  She has diarrhea but it is a normal symptom for her. She has had sweats and chills but never a fever when checking Her BS have remained in a good range 95 today. She has treated with benadryl Mucinex. Per protocol pt was given home care instructions. Pt verbalized understanding of all instructions. Reason for Disposition . [1] Probable influenza (fever) with no complications AND [1] NOT HIGH RISK  Answer Assessment - Initial Assessment Questions 1. WORST SYMPTOM: "What is your worst symptom?" (e.g., cough, runny nose, muscle aches, headache, sore throat, fever)      Muscle aches headache diarrhea  2. ONSET: "When did your flu symptoms start?"      Last week with mild symptoms 3. COUGH: "How bad is the cough?"       cough and choking sneezing 4. RESPIRATORY DISTRESS: "Describe your breathing."      No breathing problems 5. FEVER: "Do you have a fever?" If so, ask: "What is your temperature, how was it measured, and when did it start?"    Unsure Chills and sweats 6. EXPOSURE: "Were you exposed to someone with influenza?"       No but son positive 7. FLU VACCINE: "Did you get a flu shot this year?"    Yes in February and pneumonia 8. 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 PREGNANCY: "Is there any chance you are pregnant?" "When was your last menstrual period?"    N/A 10. OTHER SYMPTOMS: "Do you have any other symptoms?"  (e.g., runny nose, muscle aches, headache, sore throat)      Muscle aches, runny nose headache  Protocols used: INFLUENZA - SEASONAL-A-AH

## 2018-10-01 ENCOUNTER — Telehealth: Payer: Self-pay

## 2018-10-01 NOTE — Telephone Encounter (Signed)
Call to patient to reschedule in regards to COVID-19 restrictions.  Pt voices concerns r/t surgical clearance. Per chart, medically necessary d/t cellulitis hx.   Call made to [facility] surgery, they reported that surgery had been removed from schedule d/t national regulations in relation to Covid 19 virus.   Return call to pt to make aware that we would be cancelling appt and would reschedule asap.   COVID-19 Pre-Screening:  1. Have you been in contact with someone who was sick?  no 2. Do you have any of the following symptoms (cough, fever, muscle pain, vomiting, diarrhea, weakness abdominal pain, rash, red eye, bruising or bleeding, joint pain, severe headache)?  no 3. Have you travelled internationally or out of state in the last month?  no 4. Do you need any refills at this time?  n/a  Patient aware of the following: Please be advised that we require, no one but yourself to come to appointment. If necessary, only one visitor may come with you into the building. They will also be asked the same screening questions. You will be contacted at a later time to reschedule. However, this will depend on ongoing evaluation of the Covid-19 situation.  Please call us if any new questions or concerns arise. We are here for advice.  Routing to COVID cancel pool.

## 2018-10-06 ENCOUNTER — Ambulatory Visit: Payer: Medicare Other | Admitting: Nurse Practitioner

## 2018-10-15 ENCOUNTER — Other Ambulatory Visit: Payer: Self-pay

## 2018-10-15 ENCOUNTER — Emergency Department
Admission: EM | Admit: 2018-10-15 | Discharge: 2018-10-16 | Disposition: A | Discharge status: Home / Self Care | Payer: Medicare Other | Attending: Emergency Medicine | Admitting: Emergency Medicine

## 2018-10-15 ENCOUNTER — Emergency Department: Payer: Medicare Other

## 2018-10-15 DIAGNOSIS — E860 Dehydration: Secondary | ICD-10-CM | POA: Insufficient documentation

## 2018-10-15 DIAGNOSIS — Z7982 Long term (current) use of aspirin: Secondary | ICD-10-CM | POA: Diagnosis not present

## 2018-10-15 DIAGNOSIS — Z79899 Other long term (current) drug therapy: Secondary | ICD-10-CM | POA: Diagnosis not present

## 2018-10-15 DIAGNOSIS — R531 Weakness: Secondary | ICD-10-CM | POA: Diagnosis not present

## 2018-10-15 DIAGNOSIS — Z794 Long term (current) use of insulin: Secondary | ICD-10-CM | POA: Insufficient documentation

## 2018-10-15 DIAGNOSIS — I10 Essential (primary) hypertension: Secondary | ICD-10-CM | POA: Insufficient documentation

## 2018-10-15 DIAGNOSIS — R509 Fever, unspecified: Secondary | ICD-10-CM | POA: Diagnosis present

## 2018-10-15 DIAGNOSIS — B349 Viral infection, unspecified: Secondary | ICD-10-CM | POA: Diagnosis not present

## 2018-10-15 DIAGNOSIS — E119 Type 2 diabetes mellitus without complications: Secondary | ICD-10-CM | POA: Diagnosis not present

## 2018-10-15 DIAGNOSIS — Z96653 Presence of artificial knee joint, bilateral: Secondary | ICD-10-CM | POA: Insufficient documentation

## 2018-10-15 DIAGNOSIS — R0602 Shortness of breath: Secondary | ICD-10-CM | POA: Diagnosis not present

## 2018-10-15 LAB — CBC WITH DIFFERENTIAL/PLATELET
Abs Immature Granulocytes: 0.05 10*3/uL (ref 0.00–0.07)
Basophils Absolute: 0.1 10*3/uL (ref 0.0–0.1)
Basophils Relative: 0 %
Eosinophils Absolute: 0 10*3/uL (ref 0.0–0.5)
Eosinophils Relative: 0 %
HCT: 37.2 % (ref 36.0–46.0)
Hemoglobin: 11.9 g/dL — ABNORMAL LOW (ref 12.0–15.0)
Immature Granulocytes: 0 %
Lymphocytes Relative: 18 %
Lymphs Abs: 2.4 10*3/uL (ref 0.7–4.0)
MCH: 28.2 pg (ref 26.0–34.0)
MCHC: 32 g/dL (ref 30.0–36.0)
MCV: 88.2 fL (ref 80.0–100.0)
Monocytes Absolute: 0.7 10*3/uL (ref 0.1–1.0)
Monocytes Relative: 5 %
Neutro Abs: 9.8 10*3/uL — ABNORMAL HIGH (ref 1.7–7.7)
Neutrophils Relative %: 77 %
Platelets: 268 10*3/uL (ref 150–400)
RBC: 4.22 MIL/uL (ref 3.87–5.11)
RDW: 15.7 % — ABNORMAL HIGH (ref 11.5–15.5)
WBC: 13 10*3/uL — ABNORMAL HIGH (ref 4.0–10.5)
nRBC: 0 % (ref 0.0–0.2)

## 2018-10-15 LAB — COMPREHENSIVE METABOLIC PANEL
ALT: 15 U/L (ref 0–44)
AST: 18 U/L (ref 15–41)
Albumin: 3 g/dL — ABNORMAL LOW (ref 3.5–5.0)
Alkaline Phosphatase: 111 U/L (ref 38–126)
Anion gap: 9 (ref 5–15)
BUN: 13 mg/dL (ref 8–23)
CO2: 21 mmol/L — ABNORMAL LOW (ref 22–32)
Calcium: 8.4 mg/dL — ABNORMAL LOW (ref 8.9–10.3)
Chloride: 109 mmol/L (ref 98–111)
Creatinine, Ser: 1.05 mg/dL — ABNORMAL HIGH (ref 0.44–1.00)
GFR calc Af Amer: >60 mL/min (ref >60)
GFR calc non Af Amer: 53 mL/min — ABNORMAL LOW (ref >60)
Glucose, Bld: 165 mg/dL — ABNORMAL HIGH (ref 70–99)
Potassium: 3.1 mmol/L — ABNORMAL LOW (ref 3.5–5.1)
Sodium: 139 mmol/L (ref 135–145)
Total Bilirubin: 0.4 mg/dL (ref 0.3–1.2)
Total Protein: 6.8 g/dL (ref 6.5–8.1)

## 2018-10-15 LAB — LACTIC ACID, PLASMA: Lactic Acid, Venous: 1.8 mmol/L (ref 0.5–1.9)

## 2018-10-15 MED ORDER — SODIUM CHLORIDE 0.9 % IV BOLUS
1000.0000 mL | Freq: Once | INTRAVENOUS | Status: AC
  Administered 2018-10-15: 1000 mL via INTRAVENOUS

## 2018-10-15 NOTE — ED Triage Notes (Signed)
Pt in with co fever at home, headache, dizziness and shortness of breath. Pt states she has slight cough.

## 2018-10-15 NOTE — ED Notes (Addendum)
Patient states she has felt bad the last few days and then fever started today and has a slight cough

## 2018-10-15 NOTE — ED Provider Notes (Signed)
[facility] Emergency Department Provider Note   ____________________________________________   I have reviewed the triage vital signs and the nursing notes.   HISTORY  Chief Complaint Fever   History limited by: Not Limited   HPI Denise Sharp is a 73 y.o. female who presents to the emergency department today at the advice of her primary care doctor's office today because of concerns for fevers and 3-week history of feeling unwell.  She states the fevers only started this morning.  She states she has not been feeling well for the past 3 weeks.  She thought she might of had the flu.  She describes some generalized weakness.  She denies any worse shortness of breath over her normal sleep apnea.  The patient has had some chest discomfort, headache and dizziness.  She states she has been trying to see her primary care doctor for the past 3 weeks however they did not want to see her since she did not have a fever.  Today when she spiked a fever this morning that got up to 100.6 and called her primary care doctor's office they advised her to come to the emergency department.  The patient denies any known covid exposures.   Records reviewed. Per medical record review patient has a history of sleep apnea, dizziness.   Past Medical History:  Diagnosis Date  . Abnormality of gait 03/25/2013  . Adrenal mass, left ([place])   . Anemia    iron deficient post  2 unit txfsn 2009, normal endo/colonoscopy by [place]  . Arthritis   . Atypical chest pain    a. 04/1986 Cath ([facility]): nl cors, EF 65%; b. 07/2012 Cath [facility]): LM nl, LAD/LCX/RCA w/ min irregs; c. 07/2016 MV [facility]): "Equivocal" w/ small inf wall rev defect and hyperdynamic LV contraction, EF 87%; d. 08/2016 Cardiac CT Ca2+ score [facility]): Ca2+ score 1548.  [name] Cervical spinal stenosis 1994   due to trauma to back ([employer] accident), has intermittent paralysis and parasthesias  . Cervicogenic headache 03/23/2014  . Depression   .  Dizziness    chronic dizziness  . DJD (degenerative joint disease)    a. Chronic R shoulder pain pending R shoulder replacement 07/2017.  [name] Esophageal stenosis March 2011   with transietn outlet obstruction by food, cleared by EGD   . Family history of adverse reaction to anesthesia    daughter PONV  . Gastric bypass status for obesity   . Headache(784.0)   . Hypertension   . IBS (irritable bowel syndrome)   . Left bundle branch block (LBBB)    a. Intermittently present - likely rate related. - patient denies  . Obesity   . Obstructive sleep apnea    using CPAP  . 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 Restless leg syndrome   . Rotator cuff arthropathy, right 08/13/2017  . Syncope and collapse 03/12/2014  . Type II diabetes mellitus [facility])     Patient Active Problem List   Diagnosis Date Noted  . Preoperative clearance 08/23/2018  . Bilateral cataracts 06/26/2018  . UTI (urinary tract infection) due to Enterococcus 05/04/2018  . Magnesium deficiency 02/05/2018  . Status post bariatric surgery 02/05/2018  . GAD (generalized anxiety disorder) 02/01/2018  . Rectocele 01/24/2018  . Vaginal prolapse 01/04/2018  . Hepatic steatosis 01/04/2018  . Microalbuminuria due to type 2 diabetes mellitus ([place]) 11/21/2017  . Hospital discharge follow-up 09/22/2017  . Hypocalcemia 09/22/2017  . Hypotension (arterial) 09/22/2017  . Fecal incontinence 09/22/2017  . B12  deficiency 09/22/2017  . Rotator cuff arthropathy, right 08/13/2017  . Primary localized osteoarthrosis of shoulder 08/13/2017  . Encounter for preoperative examination for general surgical procedure 08/03/2017  . 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 Candidiasis, intertrigo 08/03/2017  . 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 Atypical chest pain 10/16/2016  . Edema 12/13/2015  . Pain and swelling of right lower leg 12/02/2015  . Hypersomnia, persistent  06/18/2015  . Mechanical breakdown of implanted electronic neurostimulator of peripheral nerve ([place]) 10/16/2014  . Obesity hypoventilation syndrome ([place]) 08/13/2014  . Frequent falls 06/23/2014  . Chronic cough 05/05/2014  . Adenoma of left adrenal gland 03/24/2014  . Cervicogenic headache 03/23/2014  . Syncope and collapse 03/12/2014  . Vitamin D deficiency 01/06/2014  . Pannus, abdominal 12/23/2013  . Rectal bleeding 12/03/2013  . Weight gain 12/03/2013  . Obesity, unspecified 08/25/2013  . Fatigue due to depression 08/04/2013  . Diaphragmatic hernia 04/06/2013  . Diabetic polyneuropathy ([place]) 03/25/2013  . Abnormality of gait 03/25/2013  . Multiple pulmonary nodules 03/20/2013  . Shortness of breath 03/20/2013  . Hypomagnesemia 08/06/2012  . Anemia 08/06/2012  . Iron deficiency anemia 08/06/2012  . Functional disorder of bladder 07/15/2012  . Incomplete bladder emptying 07/15/2012  . 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 Urge incontinence 07/15/2012  . Increased frequency of urination 07/15/2012  . OSA on CPAP 03/02/2012  . Insomnia secondary to anxiety 02/29/2012  . Sciatica 09/05/2011  . Cervical stenosis of spinal canal 06/14/2011  . Restless legs syndrome 03/13/2011  . Degenerative disk disease 03/13/2011  . Hypertension 03/12/2011  . DM type 2 with diabetic peripheral neuropathy ([place]) 03/12/2011  . Hearing loss, right 03/12/2011    Past Surgical History:  Procedure Laterality Date  . ABDOMINAL HYSTERECTOMY    . APPENDECTOMY    . COLONOSCOPY    . DIAPHRAGMATIC HERNIA REPAIR  2015  . ESOPHAGEAL DILATION     multiple  . ESOPHAGOGASTRODUODENOSCOPY    . [place]  . GALLBLADDER SURGERY  resection  . GASTRIC BYPASS    . GASTRIC BYPASS  2000, 2005   Dr. Debroah Loop  . [facility] IMPLANT PLACEMENT  April 2013   [place]  . JOINT REPLACEMENT  2007   bilateral knee. [name],  [name]  . REVERSE SHOULDER ARTHROPLASTY Right 08/13/2017   Procedure:  REVERSE RIGHT SHOULDER ARTHROPLASTY;  Surgeon: [name], MD;  Location: [place];  Service: Orthopedics;  Laterality: Right;  . ROTATOR CUFF REPAIR     right  . [place]  . TOTAL ABDOMINAL HYSTERECTOMY W/ BILATERAL SALPINGOOPHORECTOMY  1974  . UMBILICAL HERNIA REPAIR  Aug 11, 2015    Prior to Admission medications   Medication Sig Start Date End Date Taking? Authorizing Provider  albuterol (PROVENTIL HFA;VENTOLIN HFA) 108 (90 Base) MCG/ACT inhaler Inhale 2 puffs into the lungs every 8 (eight) hours as needed for wheezing or shortness of breath. 05/29/16   Burnard Hawthorne, FNP  aspirin EC 81 MG tablet Take 1 tablet (81 mg total) by mouth daily. 03/06/17   End, Harrell Gave, MD  benzonatate (TESSALON) 200 MG capsule Take 1 capsule (200 mg total) by mouth 2 (two) times daily as needed for cough. 11/19/17   [name], MD  Blood Glucose Monitoring Suppl (ONETOUCH VERIO) w/Device KIT 1 Device by Does not apply route daily. Use to test blood glucose once daily. Dx code = E11.9 12/16/17   [name],  MD  butalbital-acetaminophen-caffeine (FIORICET, ESGIC) 50-325-40 MG tablet Take 1 tablet by mouth every 6 (six) hours as needed for headache. 09/25/18   [name], MD  clotrimazole-betamethasone (LOTRISONE) cream Apply 1 application topically 2 (two) times daily. 01/24/18   Gae Dry, MD  cyanocobalamin (,VITAMIN B-12,) 1000 MCG/ML injection Inject 1 ml (1000 mcg ) IM weekly x 4,  Then monthly thereafter 09/25/18   [name], MD  diazepam (VALIUM) 5 MG tablet Take 1 tablet (5 mg total) by mouth at bedtime as needed for anxiety. 07/15/18   [name], MD  dicyclomine (BENTYL) 10 MG capsule Take 10 mg by mouth 4 (four) times daily -  before meals and at bedtime.    [provider]  Dulaglutide (TRULICITY) 1.5 KU/5.7DY SOPN Inject 1.5 mg into the skin once a week. 04/28/18   [name], MD  Eszopiclone 3 MG TABS Take 1 tablet (3 mg total) by  mouth at bedtime. 06/02/18   [name], MD  glucose blood test strip Use to test blood sugar once daily. Dx code = E11.9 12/16/17   [name], MD  Insulin Glargine (LANTUS SOLOSTAR) 100 UNIT/ML Solostar Pen Inject 11 Units into the skin daily. 04/28/18   [name], MD  Insulin Pen Needle 31G X 5 MM MISC Use to inject insulin daily 07/24/18   [name], MD  Lancet Devices (ONE TOUCH DELICA LANCING DEV) MISC Use to test blood glucose once daily. Dx code = E11.9 12/16/17   [name], MD  levETIRAcetam (KEPPRA) 750 MG tablet Take 1 tablet (750 mg total) by mouth at bedtime. 08/08/18   Kathrynn Ducking, MD  NONFORMULARY OR COMPOUNDED ITEM [facility]  Combination Pain Cream -  Baclofen 2%, Doxepin 5%, Gabapentin 6%, Topiramate 2%, Pentoxifylline 3% Apply 1-2 grams to affected area 3-4 times daily Qty. 120 gm 3 refills 10/14/17   Gardiner Barefoot, DPM  nystatin [facility]) powder Apply topically 4 (four) times daily.    [provider]  ondansetron (ZOFRAN) 4 MG tablet Take 1 tablet (4 mg total) by mouth every 8 (eight) hours as needed for nausea or vomiting. 07/15/18   [name], MD  [facility] DELICA LANCETS FINE MISC Use to test blood sguar once daily. Dx code = E11.9 12/16/17   [name], MD  pregabalin (LYRICA) 100 MG capsule Take 100 mg by mouth daily.    [provider]  rosuvastatin (CRESTOR) 10 MG tablet Take 1 tablet (10 mg total) by mouth daily. 04/28/18   [name], MD  Syringe/Needle, Disp, (SYRINGE 3CC/25GX1") 25G X 1" 3 ML MISC Use for b12 injections 09/23/17   [name], MD  traMADol (ULTRAM) 50 MG tablet Take 0.5 tablets (25 mg total) by mouth every 12 (twelve) hours as needed for severe pain. 08/21/17   Regalado, Cassie Freer, MD    Allergies Biaxin [clarithromycin]; Amoxicillin; Demeclocycline; Erythromycin; Flagyl [metronidazole]; Glucophage [metformin hcl]; Tetracyclines & related; Diovan [valsartan]; Sulfa antibiotics; and  Xanax [alprazolam]  Family History  Problem Relation Age of Onset  . Heart disease Father   . Hypertension Father   . Prostate cancer Father   . Stroke Father   . Osteoporosis Father   . Stroke Mother   . Depression Mother   . Headache Mother   . Heart disease Mother   . Thyroid disease Mother   . Hypertension Mother   . Diabetes Daughter   . Heart disease  Daughter   . Hypertension Daughter   . Hypertension Son     Social History Social History   Tobacco Use  . Smoking status: Never Smoker  . Smokeless tobacco: Never Used  Substance Use Topics  . Alcohol use: No  . Drug use: No    Review of Systems Constitutional: Positive for fevers.  Eyes: No visual changes. ENT: No sore throat. Cardiovascular: Positive for chest pain.  Respiratory: Denies shortness of breath. Gastrointestinal: No abdominal pain.  No nausea, no vomiting.  No diarrhea.   Genitourinary: Negative for dysuria. Musculoskeletal: Negative for back pain. Skin: Negative for rash. Neurological: Positive for dizziness.  ____________________________________________   PHYSICAL EXAM:  VITAL SIGNS: ED Triage Vitals [10/15/18 2218]  Enc Vitals Group     BP (!) 148/72     Pulse Rate 87     Resp 20     Temp 98.8 F (37.1 C)     Temp Source Oral     SpO2 95 %     Weight 228 lb (103.4 kg)     Height 5' 4" (1.626 m)     Head Circumference      Peak Flow      Pain Score 5   Constitutional: Alert and oriented.  Eyes: Conjunctivae are normal.  ENT      Head: Normocephalic and atraumatic.      Nose: No congestion/rhinnorhea.      Mouth/Throat: Mucous membranes are moist.      Neck: No stridor. Hematological/Lymphatic/Immunilogical: No cervical lymphadenopathy. Cardiovascular: Normal rate, regular rhythm.  No murmurs, rubs, or gallops.  Respiratory: Normal respiratory effort without tachypnea nor retractions. Breath sounds are clear and equal bilaterally. No wheezes/rales/rhonchi. Gastrointestinal:  Soft and non tender. No rebound. No guarding.  Genitourinary: Deferred Musculoskeletal: Normal range of motion in all extremities. No lower extremity edema. Neurologic:  Normal speech and language. No gross focal neurologic deficits are appreciated.  Skin:  Skin is warm, dry and intact. No rash noted. Psychiatric: Mood and affect are normal. Speech and behavior are normal. Patient exhibits appropriate insight and judgment.  ____________________________________________    LABS (pertinent positives/negatives)  Labs pending  ____________________________________________   EKG  None  ____________________________________________    RADIOLOGY  CXR Mild bibasilar atelectasis  ____________________________________________   PROCEDURES  Procedures  ____________________________________________   INITIAL IMPRESSION / ASSESSMENT AND PLAN / ED COURSE  Pertinent labs & imaging results that were available during my care of the patient were reviewed by me and considered in my medical decision making (see chart for details).   Patient presented to the emergency department because of concern for feeling unwell and fevers. She has not been feeling well for a few weeks but started with fever today. Patient is afebrile here. On exam patient without concerning auscultation findings. Abdomen is benign. Will however send out broad blood work, cxr to evaluate for possible etiology of the patient's symptoms.   ___________________________________________   FINAL CLINICAL IMPRESSION(S) / ED DIAGNOSES  Final diagnoses:  Viral syndrome  Weakness  Dehydration     Note: This dictation was prepared with Dragon dictation. Any transcriptional errors that result from this process are unintentional     Nance Pear, MD 10/16/18 1549

## 2018-10-16 DIAGNOSIS — B349 Viral infection, unspecified: Secondary | ICD-10-CM | POA: Diagnosis not present

## 2018-10-16 LAB — URINALYSIS, COMPLETE (UACMP) WITH MICROSCOPIC
Bacteria, UA: NONE SEEN
Bilirubin Urine: NEGATIVE
Glucose, UA: NEGATIVE mg/dL
Ketones, ur: NEGATIVE mg/dL
Leukocytes,Ua: NEGATIVE
Nitrite: NEGATIVE
Protein, ur: NEGATIVE mg/dL
Specific Gravity, Urine: 1.019 (ref 1.005–1.030)
pH: 5 (ref 5.0–8.0)

## 2018-10-16 MED ORDER — POTASSIUM CHLORIDE CRYS ER 20 MEQ PO TBCR
40.0000 meq | EXTENDED_RELEASE_TABLET | Freq: Once | ORAL | Status: AC
  Administered 2018-10-16: 40 meq via ORAL
  Filled 2018-10-16: qty 2

## 2018-10-16 NOTE — Discharge Instructions (Signed)
1.  Drink plenty of fluids daily. 2.  Take Tylenol as needed for fever greater than 100.4 F. 3.  Blood cultures are pending.  You will be notified of any positive results in 2 to 3 days time. 4.  Return to the ER for worsening symptoms, persistent vomiting, difficulty breathing or other concerns.

## 2018-10-16 NOTE — ED Provider Notes (Signed)
-----------------------------------------   12:24 AM on 10/16/2018 -----------------------------------------  Patient feeling better after IV fluids.  Previous provider had canceled urinalysis.  Updated patient of test results.  Will replete potassium here.  Most likely viral syndrome without Covid suspicion.  No pneumonia on chest x-ray.  Especially given patient's multiple allergies to antibiotics, would not treat with antibiotics without more concrete evidence.  Lactic acid is negative.  Patient will follow-up closely with her PCP.  Strict return precautions given.  Patient verbalizes understanding agrees with plan of care.   Paulette Blanch, MD 10/16/18 [phone]

## 2018-10-17 ENCOUNTER — Ambulatory Visit: Payer: Self-pay

## 2018-10-17 NOTE — Telephone Encounter (Signed)
Patient called and says she has a cough today and a low grade fever 99.8. She says she went to the ED on 10/15/18 and was told she had low potassium, to take Tylenol. She says she woke up also feeling dizzy, but not to the point she can't get up. She says the cough is a dry cough, nothing is coming up. She says her SOB has increased from yesterday, she has a runny nose and nasal congestion that started today. She denies travel or exposure to anyone who has traveled. I called the office and spoke to Butch Penny, [facility] who advised me to let the patient know someone will call her back with Dr. Lupita Dawn recommendation. I advised the patient of the above, care advice given, patient verbalized understanding.   Reason for Disposition . [1] Continuous (nonstop) coughing interferes with work or school AND [2] no improvement using cough treatment per protocol  Answer Assessment - Initial Assessment Questions 1. ONSET: "When did the cough begin?"      Today 2. SEVERITY: "How bad is the cough today?"      Usually cough about every 10-15 minutes 3. RESPIRATORY DISTRESS: "Describe your breathing."      Increased SOB from yesterday to today 4. FEVER: "Do you have a fever?" If so, ask: "What is your temperature, how was it measured, and when did it start?"     99.8 this morning 5. HEMOPTYSIS: "Are you coughing up any blood?" If so ask: "How much?" (flecks, streaks, tablespoons, etc.)     No 6. TREATMENT: "What have you done so far to treat the cough?" (e.g., meds, fluids, humidifier)     Nothing 7. CARDIAC HISTORY: "Do you have any history of heart disease?" (e.g., heart attack, congestive heart failure)      No 8. LUNG HISTORY: "Do you have any history of lung disease?"  (e.g., pulmonary embolus, asthma, emphysema)     Sleep apnea/COPD 9. PE RISK FACTORS: "Do you have a history of blood clots?" (or: recent major surgery, recent prolonged travel, bedridden)     No 10. OTHER SYMPTOMS: "Do you have any other symptoms?  (e.g., runny nose, wheezing, chest pain)      Runny nose started today, nasal congestion started today 11. PREGNANCY: "Is there any chance you are pregnant?" "When was your last menstrual period?"       No 12. TRAVEL: "Have you traveled out of the country in the last month?" (e.g., travel history, exposures)       No  Protocols used: COUGH - ACUTE NON-PRODUCTIVE-A-AH

## 2018-10-17 NOTE — Telephone Encounter (Signed)
Set her up for virtual visit next week.

## 2018-10-17 NOTE — Telephone Encounter (Signed)
Called pt back.  Pt said she was given fluid while in ED this week on 10/15/18 for dehydration and 2 potassium pills for low potassium but ED did not give her a prescription nor recommend OTC.  Pt said that she is having a lot of coughing, shortness of breath, and extreme headache. Pt said she had a fever this morning, temp was 100.8.  Pt said she took Tylenol for fever and is now down to 99.8.  Pt says that she is having difficulty forcing fluids since she has nausea.     Informed pt that notes will be forwarded to [name] for advisement on whether or not she can be seen as a virtual visit.   Advised pt to go back to ED if symptoms worsen especially shortness of breath and high fever.   In addition, please see previous [facility] RN triage note.

## 2018-10-19 LAB — CULTURE, BLOOD (ROUTINE X 2)
Culture: NO GROWTH
Special Requests: ADEQUATE

## 2018-10-20 ENCOUNTER — Ambulatory Visit: Payer: Self-pay

## 2018-10-20 ENCOUNTER — Telehealth: Payer: Self-pay

## 2018-10-20 LAB — CULTURE, BLOOD (ROUTINE X 2)
Culture: NO GROWTH
Special Requests: ADEQUATE

## 2018-10-20 NOTE — Telephone Encounter (Signed)
Copied from [place] [phone]. Topic: Appointment Scheduling - Scheduling Inquiry for Clinic >> Oct 20, 2018 10:15 AM Burchel, Abbi R wrote: Reason for CRM:   Pt returning call to sched appt.  Attempted to reach office 4 times, Please call pt: [phone] (H)

## 2018-10-20 NOTE — Telephone Encounter (Signed)
Pt c/o 3 episodes of vomiting today. Pt also having watery diarrhea. No fever. Her preexisting RLQ pain (she has been worked up for before and has been constant for a year). Pt stated that her emesis had no bile, or blood. Pt stated she is tolerating taking sips of flavored water to try and stay hydrated. Now diarrhea watery. Pt c/o feeling thirsty all the time and feels weak. Pt has since tolerated a 1/4 sandwich that is when the diarrhea began. Per pt she has had these issues in the past.  Pt stated that she has had issues with diarrhea in the past. Pt stated that she has to "intentional breathing."  Stated that she is SOB laying on left side. Pt does not sound SOB with talking. Care advice given and routing to office for review.  Reason for Disposition . MILD vomiting with diarrhea  Answer Assessment - Initial Assessment Questions 1. VOMITING SEVERITY: "How many times have you vomited in the past 24 hours?"     - MILD:  1 - 2 times/day    - MODERATE: 3 - 5 times/day, decreased oral intake without significant weight loss or symptoms of dehydration    - SEVERE: 6 or more times/day, vomits everything or nearly everything, with significant weight loss, symptoms of dehydration     3 times 2. ONSET: "When did the vomiting begin?"      today 3. FLUIDS: "What fluids or food have you vomited up today?" "Have you been able to keep any fluids down?"     Clear liquid foam- has eaten 1/4 sandwich and flavored water 3 or 4 swallows 4. ABDOMINAL PAIN: "Are your having any abdominal pain?" If yes : "How bad is it and what does it feel like?" (e.g., crampy, dull, intermittent, constant)     RLQ abdominal pain has been worked up pain is worse today  5. DIARRHEA: "Is there any diarrhea?" If so, ask: "How many times today?"      Yes-5-6  Now yellow water 6. CONTACTS: "Is there anyone else in the family with the same symptoms?"      no 7. CAUSE: "What do you think is causing your vomiting?"    Getting sicker  and sicker when she eats-  8. HYDRATION STATUS: "Any signs of dehydration?" (e.g., dry mouth [not only dry lips], too weak to stand) "When did you last urinate?"    Thirsty all time-weakness-10:30 last urinated  9. OTHER SYMPTOMS: "Do you have any other symptoms?" (e.g., fever, headache, vertigo, vomiting blood or coffee grounds, recent head injury)     no 10. PREGNANCY: "Is there any chance you are pregnant?" "When was your last menstrual period?"       n/a  Protocols used: [facility]

## 2018-10-20 NOTE — Telephone Encounter (Signed)
Pt is scheduled for tomorrow at 12:30pm.

## 2018-10-21 ENCOUNTER — Ambulatory Visit (INDEPENDENT_AMBULATORY_CARE_PROVIDER_SITE_OTHER): Payer: Medicare Other | Admitting: Internal Medicine

## 2018-10-21 VITALS — BP 131/82 | HR 79 | Temp 97.8°F

## 2018-10-21 DIAGNOSIS — R053 Chronic cough: Secondary | ICD-10-CM

## 2018-10-21 DIAGNOSIS — K591 Functional diarrhea: Secondary | ICD-10-CM | POA: Diagnosis not present

## 2018-10-21 DIAGNOSIS — R05 Cough: Secondary | ICD-10-CM

## 2018-10-21 NOTE — Progress Notes (Signed)
Virtual Visit via Telephone Note  I connected with Denise Sharp on 10/22/18 at 12:30 PM EDT by telephone and verified that I am speaking with the correct person using two identifiers.   I discussed the limitations, risks, security and privacy concerns of performing an evaluation and management service by telephone and the availability of in person appointments. I also discussed with the patient that there may be a patient responsible charge related to this service. The patient expressed understanding and agreed to proceed.   History of Present Illness:  Acute on chronic  diarrhea for the past 3 weeks . Denies fevers,  But has had up to 30 episodes daily reportedly.  Clear water,  Aggravated by eating. Does not occur while asleep  very often .  No antibiotic use in at least 3 months,  pannectomy surgery was supposed to be done on Monday April 5 but postponed indefinitey due to COVID 19 pandemic       Had a formed stool today,  Has had No abx in over 3 months .  Treated for dehydration in ER on April 1 for hypokalemia and dehydration and sent home  .  Since then has developed cough and shortness of breath  NO fevers or body aches. However she has not coughed during the entire 30 minute telephone conversation and has talked nonstop without any signs of dyspnea   Sees GI at [facility] and was started on colestid twice daily in March as a "last resort.") (apparently an off label use to induce constipation .) Aggravated by Trulicity .  Aggravates hr diarrhea.  Has not been taking citalopram bc she doesn't feel she is depressed  . Wonders if she  Should   resume   For anxiety /IBS effect    Observations/Objective:  General appearance: alert, cooperative and articulate.  No signs of being in distress  Lungs: not short of breath ,  Speaking in very long sentences.  No cough,   Psych: affect normal,dspeech is articulate and non pressured .  Denies suicidal thoughts   Assessment and  Plan:  Hypomagnesemia Needs to be rechecked given recent diarrhea   Chronic cough There is no evidence that whatever cough she is currently reporting is infectious or acute. She did not cough during the entire 30 minute conversation today.  Reassured that she was low risk for COVID 19  Functional diarrhea Her acute episode may be resolving spontaneously,  Adding dicyclomine twice daily and  Citalopram .  c dif considered but less likely given history     Updated Medication List Outpatient Encounter Medications as of 10/21/2018  Medication Sig  . aspirin EC 81 MG tablet Take 1 tablet (81 mg total) by mouth daily.  . benzonatate (TESSALON) 200 MG capsule Take 1 capsule (200 mg total) by mouth 2 (two) times daily as needed for cough.  . Blood Glucose Monitoring Suppl (ONETOUCH VERIO) w/Device KIT 1 Device by Does not apply route daily. Use to test blood glucose once daily. Dx code = E11.9  . butalbital-acetaminophen-caffeine (FIORICET, ESGIC) 50-325-40 MG tablet Take 1 tablet by mouth every 6 (six) hours as needed for headache.  . cholestyramine light (PREVALITE) 4 g packet Take by mouth.  . clotrimazole-betamethasone (LOTRISONE) cream Apply 1 application topically 2 (two) times daily.  . cyanocobalamin (,VITAMIN B-12,) 1000 MCG/ML injection Inject 1 ml (1000 mcg ) IM weekly x 4,  Then monthly thereafter  . diazepam (VALIUM) 5 MG tablet Take 1 tablet (5 mg total) by mouth  at bedtime as needed for anxiety.  . dicyclomine (BENTYL) 10 MG capsule Take 10 mg by mouth 4 (four) times daily -  before meals and at bedtime.  . Dulaglutide (TRULICITY) 1.5 YJ/8.5UD SOPN Inject 1.5 mg into the skin once a week.  . Eszopiclone 3 MG TABS Take 1 tablet (3 mg total) by mouth at bedtime.  [name] glucose blood test strip Use to test blood sugar once daily. Dx code = E11.9  . Insulin Glargine (LANTUS SOLOSTAR) 100 UNIT/ML Solostar Pen Inject 11 Units into the skin daily.  . Insulin Pen Needle 31G X 5 MM MISC Use to  inject insulin daily  . Lancet Devices (ONE TOUCH DELICA LANCING DEV) MISC Use to test blood glucose once daily. Dx code = E11.9  . levETIRAcetam (KEPPRA) 750 MG tablet Take 1 tablet (750 mg total) by mouth at bedtime.  . NONFORMULARY OR COMPOUNDED [place]  Combination Pain Cream -  Baclofen 2%, Doxepin 5%, Gabapentin 6%, Topiramate 2%, Pentoxifylline 3% Apply 1-2 grams to affected area 3-4 times daily Qty. 120 gm 3 refills  . nystatin [facility]) powder Apply topically 4 (four) times daily.  . ondansetron (ZOFRAN) 4 MG tablet Take 1 tablet (4 mg total) by mouth every 8 (eight) hours as needed for nausea or vomiting.  Glory Rosebush DELICA LANCETS FINE MISC Use to test blood sguar once daily. Dx code = E11.9  . pregabalin (LYRICA) 100 MG capsule Take 100 mg by mouth daily.  . rosuvastatin (CRESTOR) 10 MG tablet Take 1 tablet (10 mg total) by mouth daily.  . Syringe/Needle, Disp, (SYRINGE 3CC/25GX1") 25G X 1" 3 ML MISC Use for b12 injections  . traMADol (ULTRAM) 50 MG tablet Take 0.5 tablets (25 mg total) by mouth every 12 (twelve) hours as needed for severe pain.  [name] albuterol (PROVENTIL HFA;VENTOLIN HFA) 108 (90 Base) MCG/ACT inhaler Inhale 2 puffs into the lungs every 8 (eight) hours as needed for wheezing or shortness of breath. (Patient not taking: Reported on 10/21/2018)   No facility-administered encounter medications on file as of 10/21/2018.      Follow Up Instructions:    I discussed the assessment and treatment plan with the patient. The patient was provided an opportunity to ask questions and all were answered. The patient agreed with the plan and demonstrated an understanding of the instructions.   The patient was advised to call back or seek an in-person evaluation if the symptoms worsen or if the condition fails to improve as anticipated.  I provided 30 minutes of non-face-to-face time during this encounter.   [name], MD

## 2018-10-22 ENCOUNTER — Other Ambulatory Visit (INDEPENDENT_AMBULATORY_CARE_PROVIDER_SITE_OTHER): Payer: Medicare Other

## 2018-10-22 ENCOUNTER — Other Ambulatory Visit: Payer: Self-pay

## 2018-10-22 ENCOUNTER — Telehealth: Payer: Self-pay

## 2018-10-22 DIAGNOSIS — K591 Functional diarrhea: Secondary | ICD-10-CM | POA: Diagnosis not present

## 2018-10-22 NOTE — Telephone Encounter (Signed)
Did pt leave any details in the message?

## 2018-10-22 NOTE — Assessment & Plan Note (Addendum)
Her acute episode may be resolving spontaneously,  Adding dicyclomine twice daily and  Citalopram .  c dif considered but less likely given history

## 2018-10-22 NOTE — Telephone Encounter (Signed)
Called pt. No answer. LMTCB

## 2018-10-22 NOTE — Addendum Note (Signed)
Addended by: Leeanne Rio on: 10/22/2018 02:32 PM   Modules accepted: Orders

## 2018-10-22 NOTE — Assessment & Plan Note (Signed)
There is no evidence that whatever cough she is currently reporting is infectious or acute. She did not cough during the entire 30 minute conversation today.  Reassured that she was low risk for COVID 19

## 2018-10-22 NOTE — Telephone Encounter (Signed)
Copied from [place] [id number]. Topic: General - Other >> Oct 22, 2018 11:26 AM Carolyn Stare wrote:  Pt left message on [facility] appt line, called pt no answer lvm

## 2018-10-22 NOTE — Assessment & Plan Note (Signed)
Needs to be rechecked given recent diarrhea

## 2018-10-22 NOTE — Addendum Note (Signed)
Addended by: Leeanne Rio on: 10/22/2018 02:56 PM   Modules accepted: Orders

## 2018-10-23 LAB — BASIC METABOLIC PANEL
BUN/Creatinine Ratio: 12 (ref 12–28)
BUN: 11 mg/dL (ref 8–27)
CO2: 16 mmol/L — ABNORMAL LOW (ref 20–29)
Calcium: 9.2 mg/dL (ref 8.7–10.3)
Chloride: 109 mmol/L — ABNORMAL HIGH (ref 96–106)
Creatinine, Ser: 0.93 mg/dL (ref 0.57–1.00)
GFR calc Af Amer: 71 mL/min/{1.73_m2} (ref >59)
GFR calc non Af Amer: 61 mL/min/{1.73_m2} (ref >59)
Glucose: 93 mg/dL (ref 65–99)
Potassium: 3.9 mmol/L (ref 3.5–5.2)
Sodium: 144 mmol/L (ref 134–144)

## 2018-10-23 LAB — MAGNESIUM: Magnesium: 1.5 mg/dL — ABNORMAL LOW (ref 1.6–2.3)

## 2018-10-23 NOTE — Telephone Encounter (Signed)
Declined e visit .  Patient is for surgical clearance and wants in office visit.  Recall entered.  Removing from COVID pool

## 2018-11-03 ENCOUNTER — Other Ambulatory Visit: Payer: Self-pay

## 2018-11-03 ENCOUNTER — Ambulatory Visit (INDEPENDENT_AMBULATORY_CARE_PROVIDER_SITE_OTHER): Payer: Medicare Other | Admitting: Podiatry

## 2018-11-03 ENCOUNTER — Ambulatory Visit: Payer: Medicare Other | Admitting: Internal Medicine

## 2018-11-03 ENCOUNTER — Encounter: Payer: Self-pay | Admitting: Podiatry

## 2018-11-03 VITALS — Temp 98.7°F

## 2018-11-03 DIAGNOSIS — E1149 Type 2 diabetes mellitus with other diabetic neurological complication: Secondary | ICD-10-CM | POA: Diagnosis not present

## 2018-11-03 DIAGNOSIS — M12272 Villonodular synovitis (pigmented), left ankle and foot: Secondary | ICD-10-CM | POA: Diagnosis not present

## 2018-11-03 DIAGNOSIS — M79676 Pain in unspecified toe(s): Secondary | ICD-10-CM | POA: Diagnosis not present

## 2018-11-03 DIAGNOSIS — M12271 Villonodular synovitis (pigmented), right ankle and foot: Secondary | ICD-10-CM

## 2018-11-03 DIAGNOSIS — B351 Tinea unguium: Secondary | ICD-10-CM

## 2018-11-03 NOTE — Progress Notes (Signed)
Complaint:  Visit Type: Patient returns to my office for continued preventative foot care services. Complaint: Patient states" my nails have grown long and thick and become painful to walk and wear shoes" Patient has been diagnosed with DM with no foot complications. The patient presents for preventative foot care services. No changes to ROS.  Patient says she has  no feeling in her feet.   Podiatric Exam: Vascular: dorsalis pedis and posterior tibial pulses are palpable bilateral. Capillary return is immediate. Temperature gradient is WNL. Skin turgor WNL  Sensorium: Absent  Semmes Weinstein monofilament test. Normal tactile sensation bilaterally. Nail Exam: Pt has thick disfigured discolored nails with subungual debris noted bilateral entire nail hallux through fifth toenails Ulcer Exam: There is no evidence of ulcer or pre-ulcerative changes or infection. Orthopedic Exam: Muscle tone and strength are WNL. No limitations in general ROM. No crepitus or effusions noted. Foot type and digits show no abnormalities. Bony prominences are unremarkable. 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  Overlapping second digit left foot. Skin: No Porokeratosis. No infection or ulcers  Diagnosis:  Onychomycosis, , Pain in right toe, pain in left toes Diabetes with neuropathy  Treatment & Plan Procedures and Treatment: Consent by patient was obtained for treatment procedures. The patient understood the discussion of treatment and procedures well. All questions were answered thoroughly reviewed. Debridement of mycotic and hypertrophic toenails, 1 through 5 bilateral and clearing of subungual debris. No ulceration, no infection noted.  Patient presents today and was dispensed 0ne pair ( two units) of medically necessary extra depth shoes with three pair( six units) of custom molded multiple density inserts. The shoes and the inserts are fitted to the patients ' feet and are noted to fit well and are free of defect.   Length and width of the shoes are also acceptable.  Patient was given written and verbal  instructions for wearing.  If any concerns arrive with the shoes or inserts, the patient is to call the office.Patient is to follow up with doctor in six weeks. Return Visit-Office Procedure: Patient instructed to return to the office for a follow up visit 3 months for continued evaluation and treatment.    Gardiner Barefoot DPM

## 2018-11-05 ENCOUNTER — Ambulatory Visit: Payer: Medicare Other | Admitting: Orthotics

## 2018-11-24 ENCOUNTER — Other Ambulatory Visit: Payer: Self-pay

## 2018-11-24 ENCOUNTER — Telehealth: Payer: Self-pay | Admitting: Internal Medicine

## 2018-11-24 NOTE — Telephone Encounter (Signed)
Copied from [place] [id number] [id number]. Topic: Quick Communication - Rx Refill/Question >> Nov 24, 2018 12:08 PM Waylan Rocher, Lumin L wrote: Medication: butalbital-acetaminophen-caffeine (FIORICET, ESGIC) 50-325-40 MG tablet, Eszopiclone 3 MG TABS, dicyclomine (BENTYL) 10 MG capsule   Has the patient contacted their pharmacy? {yes  (Agent: If no, request that the patient contact the pharmacy for the refill.) (Agent: If yes, when and what did the pharmacy advise?)  Preferred Pharmacy (with phone number or street name): [facility] - [place], [place] - [address] [place] [address] Phone: [phone] Fax: [phone]  Agent: Please be advised that RX refills may take up to 3 business days. We ask that you follow-up with your pharmacy.

## 2018-11-25 MED ORDER — DICYCLOMINE HCL 10 MG PO CAPS: 10.0000 mg | ORAL_CAPSULE | Freq: Three times a day (TID) | ORAL | 3 refills | Status: DC

## 2018-11-25 MED ORDER — ESZOPICLONE 3 MG PO TABS: 3.0000 mg | ORAL_TABLET | Freq: Every day | ORAL | 1 refills | Status: DC

## 2018-11-25 MED ORDER — BUTALBITAL-APAP-CAFFEINE 50-325-40 MG PO TABS: 1.0000 | ORAL_TABLET | Freq: Four times a day (QID) | ORAL | 5 refills | Status: DC | PRN

## 2018-11-25 NOTE — Telephone Encounter (Signed)
Copied from [place] [phone]. Topic: General - Inquiry >> Nov 25, 2018  9:29 AM Richardo Priest, NT wrote: Reason for CRM: Patient is calling in stating Eszopiclone 3 MG TABS and butalbital-acetaminophen-caffeine (FIORICET) 50-325-40 MG tablet, were sent to the wrong pharmacy. Patient states when they use [facility] service she is able to get the medications for free and would like [place] to send it to them.  [facility] - [place], [place] - [address] (Phone) [phone] (Fax)    Medication refills were sent to the wrong pharmacy, I reordered the medication and called [facility] and cancelled the refills their per pt request.  Serenidy Waltz,cma

## 2018-12-02 DIAGNOSIS — M25532 Pain in left wrist: Secondary | ICD-10-CM | POA: Diagnosis not present

## 2018-12-02 DIAGNOSIS — M1990 Unspecified osteoarthritis, unspecified site: Secondary | ICD-10-CM | POA: Diagnosis not present

## 2018-12-03 NOTE — Progress Notes (Signed)

## 2018-12-25 ENCOUNTER — Ambulatory Visit (INDEPENDENT_AMBULATORY_CARE_PROVIDER_SITE_OTHER): Payer: Medicare Other | Admitting: Internal Medicine

## 2018-12-25 ENCOUNTER — Other Ambulatory Visit: Payer: Self-pay

## 2018-12-25 ENCOUNTER — Encounter: Payer: Self-pay | Admitting: Internal Medicine

## 2018-12-25 ENCOUNTER — Ambulatory Visit (INDEPENDENT_AMBULATORY_CARE_PROVIDER_SITE_OTHER): Payer: Medicare Other

## 2018-12-25 VITALS — BP 139/70 | HR 78 | Wt 236.0 lb

## 2018-12-25 DIAGNOSIS — Z0000 Encounter for general adult medical examination without abnormal findings: Secondary | ICD-10-CM | POA: Diagnosis not present

## 2018-12-25 DIAGNOSIS — I9589 Other hypotension: Secondary | ICD-10-CM | POA: Diagnosis not present

## 2018-12-25 DIAGNOSIS — E1142 Type 2 diabetes mellitus with diabetic polyneuropathy: Secondary | ICD-10-CM | POA: Diagnosis not present

## 2018-12-25 DIAGNOSIS — Z20828 Contact with and (suspected) exposure to other viral communicable diseases: Secondary | ICD-10-CM | POA: Diagnosis not present

## 2018-12-25 DIAGNOSIS — E65 Localized adiposity: Secondary | ICD-10-CM | POA: Diagnosis not present

## 2018-12-25 DIAGNOSIS — K591 Functional diarrhea: Secondary | ICD-10-CM | POA: Diagnosis not present

## 2018-12-25 DIAGNOSIS — R5383 Other fatigue: Secondary | ICD-10-CM | POA: Diagnosis not present

## 2018-12-25 DIAGNOSIS — E538 Deficiency of other specified B group vitamins: Secondary | ICD-10-CM

## 2018-12-25 DIAGNOSIS — Z20822 Contact with and (suspected) exposure to covid-19: Secondary | ICD-10-CM

## 2018-12-25 DIAGNOSIS — E861 Hypovolemia: Secondary | ICD-10-CM | POA: Diagnosis not present

## 2018-12-25 NOTE — Progress Notes (Signed)
Virtual Visit converted to telephone Note  This visit type was conducted due to national recommendations for restrictions regarding the COVID-19 pandemic (e.g. social distancing).  This format is felt to be most appropriate for this patient at this time.  All issues noted in this document were discussed and addressed.  No physical exam was performed (except for noted visual exam findings with Video Visits).   I attempted to connect with@ on 12/25/18 at 10:30 AM EDT by a video enabled telemedicine application ; however  Interactive audio and video telecommunications  Ultimately failed, due to patient having technical difficulties.   We continued and completed visit with audio only. I and verified that I am speaking with the correct person using two identifiers.  Location patient: home Location provider: work or home office Persons participating in the virtual visit: patient, provider  I discussed the limitations, risks, security and privacy concerns of performing an evaluation and management service by telephone and the availability of in person appointments. I also discussed with the patient that there may be a patient responsible charge related to this service. The patient expressed understanding and agreed to proceed.  Reason for visit: follow up on multiple issues   HPI:   73 yr old female with morbid obesity s/p remote bariatric surgery, chronic diarrhea,  Obesity hypoventilation syndrome/OSA  Diarrhea has improved significantly.    Son was exposed to [place] at work  Results are still  Pending; SHE WILL NEED TESTING IF HIS IS POSITIVE.  The patient has been having  persistent  Cough and shortness of breath beyond what is typical for patient).    Fatigue: She has been checking her blood pressures and reports that she has been Hypotensive in the mornings before she gets up, with number of 80/30 and 90/40 recorded,  But improves to  139/70 once up and around takes no meds.  Fatigued all the   Time.  Not active,  Has gained weight    ROS: See pertinent positives and negatives per HPI.  Past Medical History:  Diagnosis Date  . Abnormality of gait 03/25/2013  . Adrenal mass, left ([place])   . Anemia    iron deficient post  2 unit txfsn 2009, normal endo/colonoscopy by [facility]  . Arthritis   . Atypical chest pain    a. 04/1986 Cath ([facility]): nl cors, EF 65%; b. 07/2012 Cath [facility]): LM nl, LAD/LCX/RCA w/ min irregs; c. 07/2016 [facility]): "Equivocal" w/ small inf wall rev defect and hyperdynamic LV contraction, EF 87%; d. 08/2016 Cardiac CT Ca2+ score [facility]): Ca2+ score 1548.  [name] Cervical spinal stenosis 1994   due to trauma to back ([employer] accident), has intermittent paralysis and parasthesias  . Cervicogenic headache 03/23/2014  . Depression   . Dizziness    chronic dizziness  . DJD (degenerative joint disease)    a. Chronic R shoulder pain pending R shoulder replacement 07/2017.  [name] Esophageal stenosis March 2011   with transietn outlet obstruction by food, cleared by EGD   . Family history of adverse reaction to anesthesia    daughter PONV  . Gastric bypass status for obesity   . Headache(784.0)   . Hypertension   . IBS (irritable bowel syndrome)   . Left bundle branch block (LBBB)    a. Intermittently present - likely rate related. - patient denies  . Obesity   . Obstructive sleep apnea    using CPAP  . 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 Restless leg syndrome   .  Rotator cuff arthropathy, right 08/13/2017  . Syncope and collapse 03/12/2014  . Type II diabetes mellitus ([place])     Past Surgical History:  Procedure Laterality Date  . ABDOMINAL HYSTERECTOMY    . APPENDECTOMY    . COLONOSCOPY    . DIAPHRAGMATIC HERNIA REPAIR  2015  . ESOPHAGEAL DILATION     multiple  . ESOPHAGOGASTRODUODENOSCOPY    . [place]  . GALLBLADDER SURGERY  resection  . GASTRIC BYPASS    . GASTRIC BYPASS  2000, 2005   Dr. Debroah Loop  . [facility] IMPLANT PLACEMENT  April  2013   [place]  . JOINT REPLACEMENT  2007   bilateral knee. [name],  [name]  . REVERSE SHOULDER ARTHROPLASTY Right 08/13/2017   Procedure: REVERSE RIGHT SHOULDER ARTHROPLASTY;  Surgeon: [name], MD;  Location: [place];  Service: Orthopedics;  Laterality: Right;  . ROTATOR CUFF REPAIR     right  . [place]  . TOTAL ABDOMINAL HYSTERECTOMY W/ BILATERAL SALPINGOOPHORECTOMY  1974  . UMBILICAL HERNIA REPAIR  Aug 11, 2015    Family History  Problem Relation Age of Onset  . Heart disease Father   . Hypertension Father   . Prostate cancer Father   . Stroke Father   . Osteoporosis Father   . Stroke Mother   . Depression Mother   . Headache Mother   . Heart disease Mother   . Thyroid disease Mother   . Hypertension Mother   . Diabetes Daughter   . Heart disease Daughter   . Hypertension Daughter   . Hypertension Son     SOCIAL HX: married,  No alcohol or tobacco    Current Outpatient Medications:  .  aspirin EC 81 MG tablet, Take 1 tablet (81 mg total) by mouth daily., Disp: 90 tablet, Rfl: 3 .  benzonatate (TESSALON) 200 MG capsule, Take 1 capsule (200 mg total) by mouth 2 (two) times daily as needed for cough., Disp: 30 capsule, Rfl: 0 .  Blood Glucose Monitoring Suppl (ONETOUCH VERIO) w/Device KIT, 1 Device by Does not apply route daily. Use to test blood glucose once daily. Dx code = E11.9, Disp: 1 kit, Rfl: 0 .  butalbital-acetaminophen-caffeine (FIORICET) 50-325-40 MG tablet, Take 1 tablet by mouth every 6 (six) hours as needed for headache., Disp: 180 tablet, Rfl: 5 .  cholestyramine light (PREVALITE) 4 g packet, Take by mouth., Disp: , Rfl:  .  clotrimazole-betamethasone (LOTRISONE) cream, Apply 1 application topically 2 (two) times daily., Disp: 45 g, Rfl: 3 .  cyanocobalamin (,VITAMIN B-12,) 1000 MCG/ML injection, Inject 1 ml (1000 mcg ) IM weekly x 4,  Then monthly thereafter, Disp: 110 mL, Rfl: 0 .  diazepam (VALIUM) 5 MG tablet, Take 1 tablet (5 mg  total) by mouth at bedtime as needed for anxiety., Disp: 30 tablet, Rfl: 5 .  dicyclomine (BENTYL) 10 MG capsule, Take 1 capsule (10 mg total) by mouth 4 (four) times daily -  before meals and at bedtime., Disp: 360 capsule, Rfl: 3 .  Eszopiclone 3 MG TABS, Take 1 tablet (3 mg total) by mouth at bedtime., Disp: 90 tablet, Rfl: 1 .  glucose blood test strip, Use to test blood sugar once daily. Dx code = E11.9, Disp: 100 each, Rfl: 12 .  Insulin Glargine (LANTUS SOLOSTAR) 100 UNIT/ML Solostar Pen, Inject 11 Units into the skin daily., Disp: 5 pen, Rfl: 3 .  Insulin Pen Needle 31G X 5 MM MISC, Use to inject  insulin daily, Disp: 30 each, Rfl: 1 .  Lancet Devices (ONE TOUCH DELICA LANCING DEV) MISC, Use to test blood glucose once daily. Dx code = E11.9, Disp: 1 each, Rfl: 0 .  levETIRAcetam (KEPPRA) 750 MG tablet, Take 1 tablet (750 mg total) by mouth at bedtime., Disp: 90 tablet, Rfl: 3 .  NONFORMULARY OR COMPOUNDED ITEM, [facility]  Combination Pain Cream -  Baclofen 2%, Doxepin 5%, Gabapentin 6%, Topiramate 2%, Pentoxifylline 3% Apply 1-2 grams to affected area 3-4 times daily Qty. 120 gm 3 refills, Disp: 1 each, Rfl: 3 .  nystatin (NYAMYC) powder, Apply topically 4 (four) times daily., Disp: , Rfl:  .  ondansetron (ZOFRAN) 4 MG tablet, Take 1 tablet (4 mg total) by mouth every 8 (eight) hours as needed for nausea or vomiting., Disp: 20 tablet, Rfl: 0 .  ONETOUCH DELICA LANCETS FINE MISC, Use to test blood sguar once daily. Dx code = E11.9, Disp: 100 each, Rfl: 3 .  pregabalin (LYRICA) 100 MG capsule, Take 100 mg by mouth daily., Disp: , Rfl:  .  rosuvastatin (CRESTOR) 10 MG tablet, Take 1 tablet (10 mg total) by mouth daily., Disp: 90 tablet, Rfl: 3 .  Syringe/Needle, Disp, (SYRINGE 3CC/25GX1") 25G X 1" 3 ML MISC, Use for b12 injections, Disp: 50 each, Rfl: 0 .  traMADol (ULTRAM) 50 MG tablet, Take 0.5 tablets (25 mg total) by mouth every 12 (twelve) hours as needed for severe pain., Disp: 30  tablet, Rfl: 0 .  albuterol (PROVENTIL HFA;VENTOLIN HFA) 108 (90 Base) MCG/ACT inhaler, Inhale 2 puffs into the lungs every 8 (eight) hours as needed for wheezing or shortness of breath. (Patient not taking: Reported on 10/21/2018), Disp: 1 Inhaler, Rfl: 0  EXAM:   General impression: alert, cooperative and articulate.  No signs of being in distress  Lungs: speech is fluent sentence length suggests that patient is not short of breath and not punctuated by cough, sneezing or sniffing. [name]   Psych: affect normal.  speech is articulate and non pressured .  Denies suicidal thoughts   ASSESSMENT AND PLAN:  Discussed the following assessment and plan  Hypotension Etiology unclear.  Checking am cortisol level, cbc  Functional diarrhea Her acute episode has resolved  With the use of dicyclomine twice daily   Exposure to Covid-19 Virus Per patient, she has been exposed to her son who has been exposed at work  and is awaiting the results of testing.  She is asymptomatic   Pannus, abdominal Secondary to morbid obesity and prior bariatric surgery with resulting weight loss .  She has frequent intertrigonal infections and UTs  due to body habitus and is awaiting abdominoplasty/pannectomy.  She is aware of the risks of the surgery and is determined to have this done. .     I discussed the assessment and treatment plan with the patient. The patient was provided an opportunity to ask questions and all were answered. The patient agreed with the plan and demonstrated an understanding of the instructions.   The patient was advised to call back or seek an in-person evaluation if the symptoms worsen or if the condition fails to improve as anticipated.  I provided  25 minutes of non-face-to-face time during this encounter.   [name], MD

## 2018-12-25 NOTE — Progress Notes (Addendum)
Subjective:   Denise Sharp is a 73 y.o. female who presents for Medicare Annual (Subsequent) preventive examination.  Review of Systems:  No ROS.  Medicare Wellness Virtual Visit.  Visual/audio telehealth visit, UTA vital signs.   See social history for additional risk factors.   Cardiac Risk Factors include: advanced age (>69mn, >>29women);diabetes mellitus;hypertension     Objective:     Vitals: There were no vitals taken for this visit.  There is no height or weight on file to calculate BMI.  Advanced Directives 12/25/2018 01/13/2018 12/23/2017 08/20/2017 08/19/2017 08/14/2017 07/30/2017  Does Patient Have a Medical Advance Directive? Yes Yes Yes No No Yes Yes  Type of Advance Directive Living will;Healthcare Power of [place]Living will HSpring ValleyLiving will HBon AirLiving will - HCarlinLiving will [place]rimhall NizhoniLiving will  Does patient want to make changes to medical advance directive? No - Patient declined No - Patient declined No - Patient declined No - Patient declined - No - Patient declined -  Copy of HKutztown Universityin Chart? Yes - validated most recent copy scanned in chart (See row information) Yes Yes Yes - Yes Yes  Would patient like information on creating a medical advance directive? - - - No - Patient declined - - -    Tobacco Social History   Tobacco Use  Smoking Status Never Smoker  Smokeless Tobacco Never Used     Counseling given: Not Answered   Clinical Intake:  Pre-visit preparation completed: Yes        Diabetes: Yes(Followed by pcp)  How often do you need to have someone help you when you read instructions, pamphlets, or other written materials from your doctor or pharmacy?: 1 - Never  Interpreter Needed?: No     Past Medical History:  Diagnosis Date  . Abnormality of gait 03/25/2013  . Adrenal mass, left ([place]   .  Anemia    iron deficient post  2 unit txfsn 2009, [facility] . Arthritis   . Atypical chest pain    a. 04/1986 Cath ([facility]): nl cors, EF 65%; b. 07/2012 Cath ([facility]: LM nl, LAD/LCX/RCA w/ min irregs; c. 07/2016 MV ([facility]: "Equivocal" w/ small inf wall rev defect and hyperdynamic LV contraction, EF 87%; d. 08/2016 Cardiac CT Ca2+ score ([facility]: Ca2+ score 1548.  .Marland KitchenCervical spinal stenosis 1994   due to trauma to back ([employer] accident), has intermittent paralysis and parasthesias  . Cervicogenic headache 03/23/2014  . Depression   . Dizziness    chronic dizziness  . DJD (degenerative joint disease)    a. Chronic R shoulder pain pending R shoulder replacement 07/2017.  .Marland KitchenEsophageal stenosis March 2011   with transietn outlet obstruction by food, cleared by EGD   . Family history of adverse reaction to anesthesia    daughter PONV  . Gastric bypass status for obesity   . Headache(784.0)   . Hypertension   . IBS (irritable bowel syndrome)   . Left bundle branch block (LBBB)    a. Intermittently present - likely rate related. - patient denies  . Obesity   . Obstructive sleep apnea    using CPAP  . 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 Restless leg syndrome   . Rotator cuff arthropathy, right 08/13/2017  . Syncope and collapse 03/12/2014  . Type II diabetes mellitus ([place]    Past Surgical History:  Procedure Laterality Date  .  ABDOMINAL HYSTERECTOMY    . APPENDECTOMY    . COLONOSCOPY    . DIAPHRAGMATIC HERNIA REPAIR  2015  . ESOPHAGEAL DILATION     multiple  . ESOPHAGOGASTRODUODENOSCOPY    . [place]  . GALLBLADDER SURGERY  resection  . GASTRIC BYPASS    . GASTRIC BYPASS  2000, 2005   Dr. Debroah Loop  . [facility] IMPLANT PLACEMENT  April 2013   [place]  . JOINT REPLACEMENT  2007   bilateral knee. [name],  [name]  . REVERSE SHOULDER ARTHROPLASTY Right 08/13/2017   Procedure: REVERSE RIGHT SHOULDER ARTHROPLASTY;  Surgeon: [name], MD;   Location: [place];  Service: Orthopedics;  Laterality: Right;  . ROTATOR CUFF REPAIR     right  . [place]  . TOTAL ABDOMINAL HYSTERECTOMY W/ BILATERAL SALPINGOOPHORECTOMY  1974  . UMBILICAL HERNIA REPAIR  Aug 11, 2015   Family History  Problem Relation Age of Onset  . Heart disease Father   . Hypertension Father   . Prostate cancer Father   . Stroke Father   . Osteoporosis Father   . Stroke Mother   . Depression Mother   . Headache Mother   . Heart disease Mother   . Thyroid disease Mother   . Hypertension Mother   . Diabetes Daughter   . Heart disease Daughter   . Hypertension Daughter   . Hypertension Son    Social History   Socioeconomic History  . Marital status: Married    Spouse name: [name]   . Number of children: 2  . Years of education: College   . Highest education level: Not on file  Occupational History    Employer: retired  Scientific laboratory technician  . Financial resource strain: Not hard at all  . Food insecurity    Worry: Never true    Inability: Never true  . Transportation needs    Medical: No    Non-medical: No  Tobacco Use  . Smoking status: Never Smoker  . Smokeless tobacco: Never Used  Substance and Sexual Activity  . Alcohol use: No  . Drug use: No  . Sexual activity: Not Currently    Birth control/protection: Post-menopausal, Surgical  Lifestyle  . Physical activity    Days per week: 0 days    Minutes per session: Not on file  . Stress: Not at all  Relationships  . Social Herbalist on phone: Not on file    Gets together: Not on file    Attends religious service: Not on file    Active member of club or organization: Not on file    Attends meetings of clubs or organizations: Not on file    Relationship status: Not on file  Other Topics Concern  . Not on file  Social History Narrative   Patient lives at home with husband [name].    Patient has 2 children.    Patient has [employer].    Patient is on  disability.    Patient is right handed.     Outpatient Encounter Medications as of 12/25/2018  Medication Sig  . albuterol (PROVENTIL HFA;VENTOLIN HFA) 108 (90 Base) MCG/ACT inhaler Inhale 2 puffs into the lungs every 8 (eight) hours as needed for wheezing or shortness of breath. (Patient not taking: Reported on 10/21/2018)  . aspirin EC 81 MG tablet Take 1 tablet (81 mg total) by mouth daily.  . benzonatate (TESSALON) 200 MG capsule Take 1 capsule (200 mg  total) by mouth 2 (two) times daily as needed for cough.  . Blood Glucose Monitoring Suppl (ONETOUCH VERIO) w/Device KIT 1 Device by Does not apply route daily. Use to test blood glucose once daily. Dx code = E11.9  . butalbital-acetaminophen-caffeine (FIORICET) 50-325-40 MG tablet Take 1 tablet by mouth every 6 (six) hours as needed for headache.  . cholestyramine light (PREVALITE) 4 g packet Take by mouth.  . clotrimazole-betamethasone (LOTRISONE) cream Apply 1 application topically 2 (two) times daily.  . cyanocobalamin (,VITAMIN B-12,) 1000 MCG/ML injection Inject 1 ml (1000 mcg ) IM weekly x 4,  Then monthly thereafter  . diazepam (VALIUM) 5 MG tablet Take 1 tablet (5 mg total) by mouth at bedtime as needed for anxiety.  . dicyclomine (BENTYL) 10 MG capsule Take 1 capsule (10 mg total) by mouth 4 (four) times daily -  before meals and at bedtime.  . Eszopiclone 3 MG TABS Take 1 tablet (3 mg total) by mouth at bedtime.  [name] glucose blood test strip Use to test blood sugar once daily. Dx code = E11.9  . Insulin Glargine (LANTUS SOLOSTAR) 100 UNIT/ML Solostar Pen Inject 11 Units into the skin daily.  . Insulin Pen Needle 31G X 5 MM MISC Use to inject insulin daily  . Lancet Devices (ONE TOUCH DELICA LANCING DEV) MISC Use to test blood glucose once daily. Dx code = E11.9  . levETIRAcetam (KEPPRA) 750 MG tablet Take 1 tablet (750 mg total) by mouth at bedtime.  . NONFORMULARY OR COMPOUNDED [place]  Combination Pain Cream -   Baclofen 2%, Doxepin 5%, Gabapentin 6%, Topiramate 2%, Pentoxifylline 3% Apply 1-2 grams to affected area 3-4 times daily Qty. 120 gm 3 refills  . nystatin [facility]) powder Apply topically 4 (four) times daily.  . ondansetron (ZOFRAN) 4 MG tablet Take 1 tablet (4 mg total) by mouth every 8 (eight) hours as needed for nausea or vomiting.  Glory Rosebush DELICA LANCETS FINE MISC Use to test blood sguar once daily. Dx code = E11.9  . pregabalin (LYRICA) 100 MG capsule Take 100 mg by mouth daily.  . rosuvastatin (CRESTOR) 10 MG tablet Take 1 tablet (10 mg total) by mouth daily.  . Syringe/Needle, Disp, (SYRINGE 3CC/25GX1") 25G X 1" 3 ML MISC Use for b12 injections  . traMADol (ULTRAM) 50 MG tablet Take 0.5 tablets (25 mg total) by mouth every 12 (twelve) hours as needed for severe pain.   No facility-administered encounter medications on file as of 12/25/2018.     Activities of Daily Living In your present state of health, do you have any difficulty performing the following activities: 12/25/2018  Hearing? Y  Comment Hearing aid  Vision? N  Difficulty concentrating or making decisions? N  Walking or climbing stairs? Y  Comment Unsteady gait. Uses cane when ambulating.  Dressing or bathing? N  Doing errands, shopping? N  Preparing Food and eating ? Y  Comment She does not cook due to prolonged standing.  Husband assists.  Using the Toilet? N  In the past six months, have you accidently leaked urine? Y  Do you have problems with loss of bowel control? Y  Comment Managed with daily brief  Managing your Medications? N  Managing your Finances? N  Comment Shared with husband  Housekeeping or managing your Housekeeping? Y  Comment Husband assists  Some recent data might be hidden    Patient Care Team: [name], MD as PCP - General (Internal Medicine) End, Harrell Gave, MD  as PCP - Cardiology (Cardiology) Chiropractic, [facility] as Referring Physician    Assessment:   This is a routine  wellness examination for [place].  I connected with patient 12/25/18 at 11:00 AM EDT by an audio enabled telemedicine application and verified that I am speaking with the correct person using two identifiers. Patient stated full name and DOB. Patient gave permission to continue with virtual visit. Patient's location was at home and Nurse's location was at [place] office.   Health Screenings  Mammogram - 11/2016 Colonoscopy - 12/2013 Glaucoma -none Hearing - hearing aids Hemoglobin A1C - 07/2018 Cholesterol - 02/2018 Dental- UTD Vision- visits within the last 12 months.  Social  Alcohol intake - no       Smoking history- never    Smokers in home? none Illicit drug use? none Exercise - none Diet - tries to eat a low carb diet Sexually Active -not currently BMI- discussed the importance of a healthy diet, water intake and the benefits of aerobic exercise.  Educational material provided.   Safety  Patient feels safe at home- yes Patient does have smoke detectors at home- yes Patient does wear sunscreen or protective clothing when in direct sunlight -yes Patient does wear seat belt when in a moving vehicle -yes  Covid-19 precautions and sickness symptoms discussed.   Activities of Daily Living Patient denies needing assistance with: driving, household chores, feeding themselves, getting from bed to chair, getting to the toilet, bathing/showering, dressing, managing money, or preparing meals.  Husband manages the cooking  Depression Screen Patient denies losing interest in daily life, feeling hopeless, or crying easily over simple problems.   Medication-taking as directed and without issues.   Fall Screen Patient denies being afraid of falling or falling in the last year.   Memory Screen Patient is alert.  Patient denies difficulty focusing, concentrating or misplacing items. Correctly identified the president of the [place], season and recall.  Immunizations The following  Immunizations were discussed: Influenza, shingles, pneumonia, and tetanus.   Other Providers Patient Care Team: [name], MD as PCP - General (Internal Medicine) End, Harrell Gave, MD as PCP - Cardiology (Cardiology) Chiropractic, [facility] as Referring Physician  Exercise Activities and Dietary recommendations Current Exercise Habits: The patient does not participate in regular exercise at present  Goals      Patient Stated   . Increase physical activity (pt-stated)     Use stationary bike        Fall Risk Fall Risk  12/25/2018 02/12/2018 01/13/2018 12/23/2017 11/09/2016  Falls in the past year? 0 Yes Yes Yes Yes  Comment - Emmi Telephone Survey: data to providers prior to load - - -  Number falls in past yr: - 2 or more '1 1 2 ' or more  Comment - Emmi Telephone Survey Actual Response = 3 - - -  Injury with Fall? - Yes No No No  Risk Factor Category  - - High Fall Risk High Fall Risk High Fall Risk  Risk for fall due to : - - Impaired mobility;History of fall(s) History of fall(s);Impaired balance/gait History of fall(s)  Risk for fall due to: Comment - - no feeling in lower legs and feet - -  Follow up - - Falls prevention discussed Falls prevention discussed;Education provided Education provided;Falls prevention discussed   Depression Screen PHQ 2/9 Scores 12/25/2018 01/13/2018 12/23/2017 11/09/2016  PHQ - 2 Score 0 2 1 0  PHQ- 9 Score - - - 0     Cognitive Function MMSE -  Mini Mental State Exam 08/08/2018 02/05/2018 12/23/2017 11/03/2015 11/03/2015  Orientation to time '4 5 5 5 5  ' Orientation to Place '5 5 5 5 5  ' Registration '3 3 3 3 3  ' Attention/ Calculation '5 5 5 5 5  ' Recall '3 3 2 3 3  ' Language- name 2 objects '2 2 2 2 2  ' Language- repeat '1 1 1 1 1  ' Language- follow 3 step command '3 3 3 3 3  ' Language- read & follow direction '1 1 1 1 1  ' Write a sentence '1 1 1 1 1  ' Copy design '1 1 1 1 1  ' Total score '29 30 29 30 30     ' 6CIT Screen 12/25/2018 11/09/2016  What Year? 0 points 0  points  What month? 0 points 0 points  What time? 0 points 0 points  Count back from 20 0 points 0 points  Months in reverse 0 points 0 points  Repeat phrase - 6 points  Total Score - 6    Immunization History  Administered Date(s) Administered  . Influenza Split 06/13/2011  . Influenza, High Dose Seasonal PF 08/21/2018  . Influenza,inj,Quad PF,6+ Mos 05/04/2014, 05/12/2015  . Influenza-Unspecified 04/15/2012  . Pneumococcal Conjugate-13 08/11/2013, 01/06/2014  . Pneumococcal Polysaccharide-23 01/19/2010, 09/03/2018  . Tdap 04/18/2016   Screening Tests Health Maintenance  Topic Date Due  . OPHTHALMOLOGY EXAM  10/15/2017  . FOOT EXAM  11/05/2017  . URINE MICROALBUMIN  06/04/2018  . MAMMOGRAM  12/06/2018  . HEMOGLOBIN A1C  02/12/2019  . INFLUENZA VACCINE  02/14/2019  . COLONOSCOPY  01/07/2024  . TETANUS/TDAP  04/18/2026  . DEXA SCAN  Completed  . Hepatitis C Screening  Completed  . PNA vac Low Risk Adult  Completed      Plan:    End of life planning; Advance aging; Advanced directives discussed.  Copy of current HCPOA/Living Will on file.    I have personally reviewed and noted the following in the patient's chart:   . Medical and social history . Use of alcohol, tobacco or illicit drugs  . Current medications and supplements . Functional ability and status . Nutritional status . Physical activity . Advanced directives . List of other physicians . Hospitalizations, surgeries, and ER visits in previous 12 months . Vitals . Screenings to include cognitive, depression, and falls . Referrals and appointments  In addition, I have reviewed and discussed with patient certain preventive protocols, quality metrics, and best practice recommendations. A written personalized care plan for preventive services as well as general preventive health recommendations were provided to patient.     OBrien-Blaney, Janelly Switalski L, LPN  7/34/2876     I have reviewed the above  information and agree with above.   Deborra Medina, MD

## 2018-12-25 NOTE — Patient Instructions (Addendum)
  Denise Sharp , Thank you for taking time to come for your Medicare Wellness Visit. I appreciate your ongoing commitment to your health goals. Please review the following plan we discussed and let me know if I can assist you in the future.   These are the goals we discussed: Goals      Patient Stated   . Increase physical activity (pt-stated)     Use stationary bike        This is a list of the screening recommended for you and due dates:  Health Maintenance  Topic Date Due  . Eye exam for diabetics  10/15/2017  . Complete foot exam   11/05/2017  . Urine Protein Check  06/04/2018  . Mammogram  12/06/2018  . Hemoglobin A1C  02/12/2019  . Flu Shot  02/14/2019  . Colon Cancer Screening  01/07/2024  . Tetanus Vaccine  04/18/2026  . DEXA scan (bone density measurement)  Completed  .  Hepatitis C: One time screening is recommended by Center for Disease Control  (CDC) for  adults born from [birth year] through 1965.   Completed  . Pneumonia vaccines  Completed

## 2018-12-25 NOTE — Progress Notes (Signed)
BS was 124 this morning. Pt has had exposure to possible covid-19 from her son and his family. Pt stated that she has a persistent cough, and chills and states that she has SOBr when doing anything.

## 2018-12-27 NOTE — Assessment & Plan Note (Addendum)
Her acute episode has resolved  With the use of dicyclomine twice daily

## 2018-12-27 NOTE — Assessment & Plan Note (Signed)
Etiology unclear.  Checking am cortisol level, cbc

## 2018-12-27 NOTE — Assessment & Plan Note (Signed)
Secondary to morbid obesity and prior bariatric surgery with resulting weight loss .  She has frequent intertrigonal infections and UTs  due to body habitus and is awaiting abdominoplasty/pannectomy.  She is aware of the risks of the surgery and is determined to have this done. [name]

## 2018-12-27 NOTE — Assessment & Plan Note (Signed)
Per patient, she has been exposed to her son who has been exposed at work  and is awaiting the results of testing.  She is asymptomatic

## 2018-12-29 ENCOUNTER — Telehealth: Payer: Self-pay | Admitting: Internal Medicine

## 2018-12-29 ENCOUNTER — Other Ambulatory Visit: Payer: Self-pay | Admitting: Internal Medicine

## 2018-12-29 DIAGNOSIS — F411 Generalized anxiety disorder: Secondary | ICD-10-CM

## 2018-12-29 MED ORDER — DIAZEPAM 5 MG PO TABS: 5.0000 mg | ORAL_TABLET | Freq: Every evening | ORAL | 5 refills | Status: DC | PRN

## 2018-12-29 NOTE — Progress Notes (Signed)
[facility]

## 2018-12-29 NOTE — Telephone Encounter (Signed)
I do not send controlled substances via mail order.  It HAS to go to a local pharmacy

## 2018-12-29 NOTE — Telephone Encounter (Signed)
Copied from [place] [phone]. Topic: Quick Communication - Rx Refill/Question >> Dec 29, 2018 12:34 PM Pauline Good wrote: Medication: Test Strips one touch Viro, want you to put in for 2mons refills diazepam (VALIUM  Has the patient contacted their pharmacy? No (Agent: If no, request that the patient contact the pharmacy for the refill.)has no more refills (Agent: If yes, when and what did the pharmacy advise?)  Preferred Pharmacy (with phone number or street name): [facility]  Agent: Please be advised that RX refills may take up to 3 business days. We ask that you follow-up with your pharmacy.

## 2018-12-29 NOTE — Telephone Encounter (Signed)
Epic WILL NOT LET ME SIGN THE ORDER  I have never seen the excuse it is giving me as to why.  Please send it as a refill request

## 2018-12-29 NOTE — Telephone Encounter (Signed)
Pt is requesting a refill on valium, sent to [place] mail order.  Adrieana Fennelly,cma

## 2018-12-30 ENCOUNTER — Other Ambulatory Visit: Payer: Self-pay

## 2018-12-30 MED ORDER — ONETOUCH VERIO W/DEVICE KIT: 1.0000 | PACK | Freq: Every day | 0 refills | Status: DC

## 2018-12-30 NOTE — Telephone Encounter (Signed)
Patient would like a call back because she says she always fills this at [facility] and has never had a problem. She gets her medication free with [facility].

## 2018-12-30 NOTE — Telephone Encounter (Signed)
Called and spoke with the patient and informed her that the pharmacy was wrong and it was changed and sent to the correct pharmacy for the provider to fill. Rein Popov,cma

## 2018-12-30 NOTE — Telephone Encounter (Signed)
2nd response:  I do not sent valium through mail order she has to get it locally , which is where I sent it

## 2018-12-31 NOTE — Telephone Encounter (Signed)
Called and left a message to call back about refills.  Denise Sharp,cma

## 2019-01-01 ENCOUNTER — Other Ambulatory Visit (INDEPENDENT_AMBULATORY_CARE_PROVIDER_SITE_OTHER): Payer: Medicare Other

## 2019-01-01 ENCOUNTER — Other Ambulatory Visit: Payer: Self-pay

## 2019-01-01 ENCOUNTER — Telehealth: Payer: Self-pay | Admitting: Internal Medicine

## 2019-01-01 DIAGNOSIS — E861 Hypovolemia: Secondary | ICD-10-CM | POA: Diagnosis not present

## 2019-01-01 DIAGNOSIS — E538 Deficiency of other specified B group vitamins: Secondary | ICD-10-CM | POA: Diagnosis not present

## 2019-01-01 DIAGNOSIS — I9589 Other hypotension: Secondary | ICD-10-CM | POA: Diagnosis not present

## 2019-01-01 DIAGNOSIS — R748 Abnormal levels of other serum enzymes: Secondary | ICD-10-CM

## 2019-01-01 DIAGNOSIS — E1142 Type 2 diabetes mellitus with diabetic polyneuropathy: Secondary | ICD-10-CM | POA: Diagnosis not present

## 2019-01-01 DIAGNOSIS — R5383 Other fatigue: Secondary | ICD-10-CM

## 2019-01-01 LAB — COMPREHENSIVE METABOLIC PANEL
ALT: 13 U/L (ref 0–35)
AST: 13 U/L (ref 0–37)
Albumin: 3.6 g/dL (ref 3.5–5.2)
Alkaline Phosphatase: 129 U/L — ABNORMAL HIGH (ref 39–117)
BUN: 10 mg/dL (ref 6–23)
CO2: 22 mEq/L (ref 19–32)
Calcium: 8.8 mg/dL (ref 8.4–10.5)
Chloride: 104 mEq/L (ref 96–112)
Creatinine, Ser: 0.95 mg/dL (ref 0.40–1.20)
GFR: 57.62 mL/min — ABNORMAL LOW (ref >60.00)
Glucose, Bld: 277 mg/dL — ABNORMAL HIGH (ref 70–99)
Potassium: 4.3 mEq/L (ref 3.5–5.1)
Sodium: 139 mEq/L (ref 135–145)
Total Bilirubin: 0.3 mg/dL (ref 0.2–1.2)
Total Protein: 6.5 g/dL (ref 6.0–8.3)

## 2019-01-01 LAB — CBC WITH DIFFERENTIAL/PLATELET
Basophils Absolute: 0.1 10*3/uL (ref 0.0–0.1)
Basophils Relative: 0.9 % (ref 0.0–3.0)
Eosinophils Absolute: 0.1 10*3/uL (ref 0.0–0.7)
Eosinophils Relative: 1.4 % (ref 0.0–5.0)
HCT: 37.7 % (ref 36.0–46.0)
Hemoglobin: 12.1 g/dL (ref 12.0–15.0)
Lymphocytes Relative: 27.9 % (ref 12.0–46.0)
Lymphs Abs: 2.3 10*3/uL (ref 0.7–4.0)
MCHC: 32.1 g/dL (ref 30.0–36.0)
MCV: 88.2 fl (ref 78.0–100.0)
Monocytes Absolute: 0.4 10*3/uL (ref 0.1–1.0)
Monocytes Relative: 4.9 % (ref 3.0–12.0)
Neutro Abs: 5.4 10*3/uL (ref 1.4–7.7)
Neutrophils Relative %: 64.9 % (ref 43.0–77.0)
Platelets: 295 10*3/uL (ref 150.0–400.0)
RBC: 4.27 Mil/uL (ref 3.87–5.11)
RDW: 15.8 % — ABNORMAL HIGH (ref 11.5–15.5)
WBC: 8.3 10*3/uL (ref 4.0–10.5)

## 2019-01-01 LAB — VITAMIN B12: Vitamin B-12: 905 pg/mL (ref 211–911)

## 2019-01-01 LAB — CORTISOL: Cortisol, Plasma: 6.2 ug/dL

## 2019-01-01 LAB — TSH: TSH: 3.5 u[IU]/mL (ref 0.35–4.50)

## 2019-02-02 ENCOUNTER — Ambulatory Visit (INDEPENDENT_AMBULATORY_CARE_PROVIDER_SITE_OTHER): Payer: Medicare Other | Admitting: Family Medicine

## 2019-02-02 ENCOUNTER — Encounter: Payer: Self-pay | Admitting: Podiatry

## 2019-02-02 ENCOUNTER — Other Ambulatory Visit: Payer: Self-pay

## 2019-02-02 ENCOUNTER — Ambulatory Visit (INDEPENDENT_AMBULATORY_CARE_PROVIDER_SITE_OTHER): Payer: Medicare Other | Admitting: Podiatry

## 2019-02-02 ENCOUNTER — Other Ambulatory Visit: Payer: Medicare Other

## 2019-02-02 ENCOUNTER — Telehealth: Payer: Self-pay | Admitting: Internal Medicine

## 2019-02-02 VITALS — Temp 98.3°F

## 2019-02-02 DIAGNOSIS — E1149 Type 2 diabetes mellitus with other diabetic neurological complication: Secondary | ICD-10-CM

## 2019-02-02 DIAGNOSIS — R059 Cough, unspecified: Secondary | ICD-10-CM

## 2019-02-02 DIAGNOSIS — Z20828 Contact with and (suspected) exposure to other viral communicable diseases: Secondary | ICD-10-CM | POA: Diagnosis not present

## 2019-02-02 DIAGNOSIS — R5383 Other fatigue: Secondary | ICD-10-CM

## 2019-02-02 DIAGNOSIS — E11610 Type 2 diabetes mellitus with diabetic neuropathic arthropathy: Secondary | ICD-10-CM

## 2019-02-02 DIAGNOSIS — B351 Tinea unguium: Secondary | ICD-10-CM

## 2019-02-02 DIAGNOSIS — M79676 Pain in unspecified toe(s): Secondary | ICD-10-CM | POA: Diagnosis not present

## 2019-02-02 DIAGNOSIS — R05 Cough: Secondary | ICD-10-CM

## 2019-02-02 DIAGNOSIS — Z20822 Contact with and (suspected) exposure to covid-19: Secondary | ICD-10-CM

## 2019-02-02 DIAGNOSIS — R110 Nausea: Secondary | ICD-10-CM

## 2019-02-02 DIAGNOSIS — R6889 Other general symptoms and signs: Secondary | ICD-10-CM | POA: Diagnosis not present

## 2019-02-02 NOTE — Telephone Encounter (Signed)
Thanks for looking into this [place]  LG

## 2019-02-02 NOTE — Telephone Encounter (Signed)
Pt came into have labs done today  But Didn't pass the Covid screening she was having chills, headache, sore throat, nausea, cough and some SOB  Asked pt to call back and reschedule labs when she feels better

## 2019-02-02 NOTE — Telephone Encounter (Signed)
Saw podiatry this AM for appt  Can we call them to [facility] about patient symptoms?  Referred to podiatry by Dr [name]

## 2019-02-02 NOTE — Telephone Encounter (Signed)
Called patient  Mobile to schedule patient appointment and to advise patient she should not be out to stay home and quarantine til 72 hour no symptoms and/or until Negative COVID test, no answer left voicemail to return call to office.

## 2019-02-02 NOTE — Progress Notes (Signed)
Patient ID: Denise Sharp, female   DOB: [birth year], 73 y.o.   MRN: [id number]    Virtual Visit via video note  This visit type was conducted due to national recommendations for restrictions regarding the COVID-19 pandemic (e.g. social distancing).  This format is felt to be most appropriate for this patient at this time.  All issues noted in this document were discussed and addressed.  No physical exam was performed (except for noted visual exam findings with Video Visits).   I connected with Denise Sharp today at  3:00 PM EDT by a video enabled telemedicine application and verified that I am speaking with the correct person using two identifiers. Location patient: home Location provider: work or home office Persons participating in the virtual visit: patient, provider  I discussed the limitations, risks, security and privacy concerns of performing an evaluation and management service by video and the availability of in person appointments. I also discussed with the patient that there may be a patient responsible charge related to this service. The patient expressed understanding and agreed to proceed.  HPI:  Patient and I connected via video due to complaint of cough times a month, fatigue x2 to 3 months, nausea at times and a general overall feeling of " not well"  Patient was supposed to have blood work this morning, but upon doing the [place] screening prior to coming back into the lab she answered yes to possible COVID-19 symptoms. Advised due to our current restrictions, we cannot have her come into office lab at this time due to symptoms possibly being related to COVID 19  Patient states she has been exposed to a positive COVID-19 patient, it was her son who is positive approximately a month ago.  Patient also had these symptoms back in April, did go to the ER at that time and was told she had a viral syndrome.  States she was also told she was dehydrated and was given  IV normal saline and sent home told to go home & rest, increase fluids.  Patient also states she has felt feverish and chilled at times off and on throughout these past 2 months.  Nausea comes and goes, states she has coughed a few times so harshly that she did throw up a little.  Overall feels her appetite has been normal and has been trying hard to drink water and Gatorade to keep up fluids.   ROS: See pertinent positives and negatives per HPI.  Past Medical History:  Diagnosis Date  . Abnormality of gait 03/25/2013  . Adrenal mass, left ([place])   . Anemia    iron deficient post  2 unit txfsn 2009, normal endo/colonoscopy by [facility]  . Arthritis   . Atypical chest pain    a. 04/1986 Cath ([facility]): nl cors, EF 65%; b. 07/2012 Cath [facility]): LM nl, LAD/LCX/RCA w/ min irregs; c. 07/2016 [facility]): "Equivocal" w/ small inf wall rev defect and hyperdynamic LV contraction, EF 87%; d. 08/2016 Cardiac CT Ca2+ score [facility]): Ca2+ score 1548.  [name] Cervical spinal stenosis 1994   due to trauma to back ([employer] accident), has intermittent paralysis and parasthesias  . Cervicogenic headache 03/23/2014  . Depression   . Dizziness    chronic dizziness  . DJD (degenerative joint disease)    a. Chronic R shoulder pain pending R shoulder replacement 07/2017.  [name] Esophageal stenosis March 2011   with transietn outlet obstruction by food, cleared by EGD   . Family history of adverse reaction  to anesthesia    daughter PONV  . Gastric bypass status for obesity   . Headache(784.0)   . Hypertension   . IBS (irritable bowel syndrome)   . Left bundle branch block (LBBB)    a. Intermittently present - likely rate related. - patient denies  . Obesity   . Obstructive sleep apnea    using CPAP  . 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 Restless leg syndrome   . Rotator cuff arthropathy, right 08/13/2017  . Syncope and collapse 03/12/2014  . Type II diabetes mellitus ([place])     Past Surgical History:  Procedure  Laterality Date  . ABDOMINAL HYSTERECTOMY    . APPENDECTOMY    . COLONOSCOPY    . DIAPHRAGMATIC HERNIA REPAIR  2015  . ESOPHAGEAL DILATION     multiple  . ESOPHAGOGASTRODUODENOSCOPY    . [place]  . GALLBLADDER SURGERY  resection  . GASTRIC BYPASS    . GASTRIC BYPASS  2000, 2005   Dr. Debroah Loop  . [facility] IMPLANT PLACEMENT  April 2013   [place]  . JOINT REPLACEMENT  2007   bilateral knee. [name],  [name]  . REVERSE SHOULDER ARTHROPLASTY Right 08/13/2017   Procedure: REVERSE RIGHT SHOULDER ARTHROPLASTY;  Surgeon: [name], MD;  Location: [place];  Service: Orthopedics;  Laterality: Right;  . ROTATOR CUFF REPAIR     right  . [place]  . TOTAL ABDOMINAL HYSTERECTOMY W/ BILATERAL SALPINGOOPHORECTOMY  1974  . UMBILICAL HERNIA REPAIR  Aug 11, 2015    Family History  Problem Relation Age of Onset  . Heart disease Father   . Hypertension Father   . Prostate cancer Father   . Stroke Father   . Osteoporosis Father   . Stroke Mother   . Depression Mother   . Headache Mother   . Heart disease Mother   . Thyroid disease Mother   . Hypertension Mother   . Diabetes Daughter   . Heart disease Daughter   . Hypertension Daughter   . Hypertension Son    Social History   Tobacco Use  . Smoking status: Never Smoker  . Smokeless tobacco: Never Used  Substance Use Topics  . Alcohol use: No      Current Outpatient Medications:  .  albuterol (PROVENTIL HFA;VENTOLIN HFA) 108 (90 Base) MCG/ACT inhaler, Inhale 2 puffs into the lungs every 8 (eight) hours as needed for wheezing or shortness of breath., Disp: 1 Inhaler, Rfl: 0 .  aspirin EC 81 MG tablet, Take 1 tablet (81 mg total) by mouth daily., Disp: 90 tablet, Rfl: 3 .  benzonatate (TESSALON) 200 MG capsule, Take 1 capsule (200 mg total) by mouth 2 (two) times daily as needed for cough., Disp: 30 capsule, Rfl: 0 .  Blood Glucose Monitoring Suppl (ONETOUCH VERIO) w/Device KIT, 1 Device by Does  not apply route daily. Use to test blood glucose once daily. Dx code = E11.9, Disp: 1 kit, Rfl: 0 .  butalbital-acetaminophen-caffeine (FIORICET) 50-325-40 MG tablet, Take 1 tablet by mouth every 6 (six) hours as needed for headache., Disp: 180 tablet, Rfl: 5 .  cholestyramine light (PREVALITE) 4 g packet, Take by mouth., Disp: , Rfl:  .  clotrimazole-betamethasone (LOTRISONE) cream, Apply 1 application topically 2 (two) times daily., Disp: 45 g, Rfl: 3 .  cyanocobalamin (,VITAMIN B-12,) 1000 MCG/ML injection, Inject 1 ml (1000 mcg ) IM weekly x 4,  Then monthly thereafter, Disp: 110 mL, Rfl: 0 .  diazepam (  VALIUM) 5 MG tablet, Take 1 tablet (5 mg total) by mouth at bedtime as needed for anxiety., Disp: 30 tablet, Rfl: 5 .  dicyclomine (BENTYL) 10 MG capsule, Take 1 capsule (10 mg total) by mouth 4 (four) times daily -  before meals and at bedtime., Disp: 360 capsule, Rfl: 3 .  Eszopiclone 3 MG TABS, Take 1 tablet (3 mg total) by mouth at bedtime., Disp: 90 tablet, Rfl: 1 .  glucose blood test strip, Use to test blood sugar once daily. Dx code = E11.9, Disp: 100 each, Rfl: 12 .  Insulin Glargine (LANTUS SOLOSTAR) 100 UNIT/ML Solostar Pen, Inject 11 Units into the skin daily., Disp: 5 pen, Rfl: 3 .  Insulin Pen Needle 31G X 5 MM MISC, Use to inject insulin daily, Disp: 30 each, Rfl: 1 .  Lancet Devices (ONE TOUCH DELICA LANCING DEV) MISC, Use to test blood glucose once daily. Dx code = E11.9, Disp: 1 each, Rfl: 0 .  levETIRAcetam (KEPPRA) 750 MG tablet, Take 1 tablet (750 mg total) by mouth at bedtime., Disp: 90 tablet, Rfl: 3 .  NONFORMULARY OR COMPOUNDED ITEM, [facility]  Combination Pain Cream -  Baclofen 2%, Doxepin 5%, Gabapentin 6%, Topiramate 2%, Pentoxifylline 3% Apply 1-2 grams to affected area 3-4 times daily Qty. 120 gm 3 refills, Disp: 1 each, Rfl: 3 .  nystatin (NYAMYC) powder, Apply topically 4 (four) times daily., Disp: , Rfl:  .  ondansetron (ZOFRAN) 4 MG tablet, Take 1 tablet (4  mg total) by mouth every 8 (eight) hours as needed for nausea or vomiting., Disp: 20 tablet, Rfl: 0 .  ONETOUCH DELICA LANCETS FINE MISC, Use to test blood sguar once daily. Dx code = E11.9, Disp: 100 each, Rfl: 3 .  pregabalin (LYRICA) 100 MG capsule, Take 100 mg by mouth daily., Disp: , Rfl:  .  rosuvastatin (CRESTOR) 10 MG tablet, Take 1 tablet (10 mg total) by mouth daily., Disp: 90 tablet, Rfl: 3 .  Syringe/Needle, Disp, (SYRINGE 3CC/25GX1") 25G X 1" 3 ML MISC, Use for b12 injections, Disp: 50 each, Rfl: 0 .  traMADol (ULTRAM) 50 MG tablet, Take 0.5 tablets (25 mg total) by mouth every 12 (twelve) hours as needed for severe pain., Disp: 30 tablet, Rfl: 0  EXAM:  GENERAL: alert, oriented, appears well and in no acute distress  HEENT: atraumatic, conjunttiva clear, no obvious abnormalities on inspection of external nose and ears  NECK: normal movements of the head and neck  LUNGS: on inspection no signs of respiratory distress, breathing rate appears normal, no obvious gross SOB, gasping or wheezing  CV: no obvious cyanosis  MS: moves all visible extremities without noticeable abnormality  PSYCH/NEURO: pleasant and cooperative, no obvious depression or anxiety, speech and thought processing grossly intact  ASSESSMENT AND PLAN:  Discussed the following assessment and plan:  Suspected COVID-19 virus infection, cough, fatigue, nausea - advised that due to symptoms, we need to get patient set up for COVID-19 testing.  Patient advised that I will put order in and he can go to testing location for for drive-through testing. Patient given the address of testing site.  Patient advised that testing is taking 2 to 7 days to result, and while we are awaiting results patient must remain under self quarantine and monitor for any changing/worsening symptoms.  Advised over-the-counter medications such as Tylenol can be used to help treat pain or fevers, Robitussin can be used to help calm cough or Rx  tessalon perles, allergy medication such as  Claritin or Allegra can help reduce congestion.  Also discussed getting plenty of rest and increasing fluid intake.  Discussed if having nausea to drink clear liquids and eat only bland foods for 1 to 2 days and slowly advance diet as tolerated. Made patient aware that test results as well as how his symptoms progress will determine when the self quarantine will be able to end.  Also advised to monitor self for any worsening symptoms, advised if severe shortness of breath develops, high fever that is not reduced with use of Tylenol, chest pain, severe vomiting or diarrhea  -- patient must call on-call and or go to ER right away for evaluation. patient verbalized understanding of these instructions.    I discussed the assessment and treatment plan with the patient. The patient was provided an opportunity to ask questions and all were answered. The patient agreed with the plan and demonstrated an understanding of the instructions.   The patient was advised to call back or seek an in-person evaluation if the symptoms worsen or if the condition fails to improve as anticipated.  I provided 15 minutes of video-face-to-face time during this encounter.   Jodelle Green, FNP

## 2019-02-02 NOTE — Telephone Encounter (Signed)
After investigating this further I cannot divulge private health information without patient signing a release. We did not refer patient to podiatry per referrals in her chart. Even if we made the referral we still cannot divulge.

## 2019-02-02 NOTE — Telephone Encounter (Signed)
Patient called back and stated she has had These symptoms for a month has been exposed to her son whom tested  positive . She has cough , congestion SOB , denies fever but has chills , with these symptoms patient cannot come into office scheduled virtual.

## 2019-02-02 NOTE — Progress Notes (Signed)
Complaint:  Visit Type: Patient returns to my office for continued preventative foot care services. Complaint: Patient states" my nails have grown long and thick and become painful to walk and wear shoes" Patient has been diagnosed with DM with no foot complications. The patient presents for preventative foot care services. No changes to ROS.  Patient says she is developing multiple skin lesions.   Podiatric Exam: Vascular: dorsalis pedis and posterior tibial pulses are palpable bilateral. Capillary return is immediate. Temperature gradient is WNL. Skin turgor WNL  Sensorium: Absent  Semmes Weinstein monofilament test. Normal tactile sensation bilaterally. Nail Exam: Pt has thick disfigured discolored nails with subungual debris noted bilateral entire nail hallux through fifth toenails Ulcer Exam: There is no evidence of ulcer or pre-ulcerative changes or infection. Orthopedic Exam: Muscle tone and strength are WNL. No limitations in general ROM. No crepitus or effusions noted. Foot type and digits show no abnormalities. Bony prominences are unremarkable. 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  Overlapping second digit left foot. Skin: No Porokeratosis. No infection or ulcers  Diagnosis:  Onychomycosis, , Pain in right toe, pain in left toes Diabetes with neuropathy  Treatment & Plan Procedures and Treatment: Consent by patient was obtained for treatment procedures. The patient understood the discussion of treatment and procedures well. All questions were answered thoroughly reviewed. Debridement of mycotic and hypertrophic toenails, 1 through 5 bilateral and clearing of subungual debris. No ulceration, no infection noted.   Return Visit-Office Procedure: Patient instructed to return to the office for a follow up visit 3 months for continued evaluation and treatment.    Gardiner Barefoot DPM

## 2019-02-03 MED ORDER — BENZONATATE 200 MG PO CAPS: 200.0000 mg | ORAL_CAPSULE | Freq: Two times a day (BID) | ORAL | 0 refills | Status: DC | PRN

## 2019-02-05 ENCOUNTER — Other Ambulatory Visit: Payer: Self-pay | Admitting: Internal Medicine

## 2019-02-05 LAB — NOVEL CORONAVIRUS, NAA: SARS-CoV-2, NAA: NOT DETECTED

## 2019-02-06 ENCOUNTER — Ambulatory Visit: Payer: Medicare Other | Admitting: Neurology

## 2019-02-11 DIAGNOSIS — E113393 Type 2 diabetes mellitus with moderate nonproliferative diabetic retinopathy without macular edema, bilateral: Secondary | ICD-10-CM | POA: Diagnosis not present

## 2019-02-17 ENCOUNTER — Encounter: Payer: Self-pay | Admitting: Internal Medicine

## 2019-02-19 ENCOUNTER — Encounter: Payer: Self-pay | Admitting: Internal Medicine

## 2019-02-19 ENCOUNTER — Other Ambulatory Visit: Payer: Self-pay

## 2019-02-19 ENCOUNTER — Ambulatory Visit (INDEPENDENT_AMBULATORY_CARE_PROVIDER_SITE_OTHER): Payer: Medicare Other | Admitting: Internal Medicine

## 2019-02-19 VITALS — BP 122/80 | HR 78 | Temp 98.6°F | Ht 64.5 in | Wt 250.2 lb

## 2019-02-19 DIAGNOSIS — R0602 Shortness of breath: Secondary | ICD-10-CM | POA: Diagnosis not present

## 2019-02-19 DIAGNOSIS — Z9989 Dependence on other enabling machines and devices: Secondary | ICD-10-CM

## 2019-02-19 DIAGNOSIS — G4733 Obstructive sleep apnea (adult) (pediatric): Secondary | ICD-10-CM | POA: Diagnosis not present

## 2019-02-19 DIAGNOSIS — R0609 Other forms of dyspnea: Secondary | ICD-10-CM | POA: Diagnosis not present

## 2019-02-19 DIAGNOSIS — R0600 Dyspnea, unspecified: Secondary | ICD-10-CM

## 2019-02-19 MED ORDER — ANORO ELLIPTA 62.5-25 MCG/INH IN AEPB: 1.0000 | INHALATION_SPRAY | Freq: Every day | RESPIRATORY_TRACT | 0 refills | Status: DC

## 2019-02-19 NOTE — Progress Notes (Signed)
Patient seen in the office today and instructed on use of Anoro.  Patient expressed understanding and demonstrated technique. 

## 2019-02-19 NOTE — Progress Notes (Signed)
HPI F never smoker followed for OSA, Hypersomnia, Insomnia complicated by IBS NPSG 3/55/73 at [facility]/ Sleep Med   Mild OSA, AHI 13.2/ hr, weight 218 lbs   CPAP 6 NPSG 05/22/15- CPAP Titration study- Optimal 7 cwp,  MSLT 05/23/15 ( she had taken Lyrica the night before during CPAP titration against protocol). 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 Walk Test 02/19/2019- Min sat 97%, Max HR 117. 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 Melvyn Novas for pulmonary, complicated by DM 2/peripheral neuropathy, degenerative disc disease, pulmonary nodules, DVT 2017, CPAP  Using older machine. Was to have replaced with auto 4-10/ [facility] ----Pt stated she coded( over medicated?) 2017-10-05 and was in and out of hospital and nursing home from February 21 and pt states doctors are trying to figure out if it is because of meds.  Pt states she is not sleeping well at night and states she feels like she is getting about 3 hours of sleep at night. CPAP compliance download 100%, averaging 7-1/2 hours use per day, AHI not reported. Machine is old.  Changing now to Auto 4-10/[facility] Reports frequent sneezing but thinks Flonase makes her sleepy. CTachest 08/20/17 IMPRESSION: 1. No evidence of a pulmonary embolism. 2. Right lower lobe atelectasis and minimal subsegmental left lower lobe atelectasis. No convincing pneumonia. No pulmonary edema. Trace right pleural effusion.  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 Melvyn Novas for pulmonary, complicated by DM 2/peripheral neuropathy, degenerative disc disease, pulmonary  nodules, DVT 2017, CPAP 4-10/ [facility] Download 97% compliance, AHI 1.1/ hr Body weight today- 250 lbs -----pt reports increased cough, shortness of breath, and weight gain in the last 3 months; OSA on CPAP aut 4-10, DME: [facility]; pt states she feels as if her CPAP is "suffocating" her.  COVID swab Neg 02/02/2019 Feels need to take deliberate deep breaths. Hears wheeze occasionally.  Covid careful- discussed.  CXR 10/15/2018  1V IMPRESSION: Mild left basilar atelectasis and/or scarring. No other active cardiopulmonary disease. Walk Test 02/19/2019- Min sat 97%, Max HR 117.  ROS-see HPI   + = positive Constitutional:    weight loss, night sweats, fevers, chills,+ fatigue, lassitude. HEENT:   + headaches, +ifficulty swallowing, tooth/dental problems, sore throat,       sneezing, itching, ear ache, +nasal congestion, post nasal drip, snoring CV:    chest pain, orthopnea, PND, + swelling in lower extremities, anasarca,                                                    dizziness, palpitations Resp:  +shortness of breath with exertion or at rest.                productive cough,   non-productive cough, coughing up of blood.              change in color of mucus.  wheezing.   Skin:    rash or lesions. GI:  + IBS GU: dysuria, change in color of urine, no urgency or frequency.  MS:   +  joint pain, stiffness, decreased range of motion, back pain. Neuro-     nothing unusual Psych:  change in mood or affect.  depression or anxiety.   memory loss.  OBJ- Physical Exam General- Alert, Oriented, Affect-appropriate, Distress- none acute, + morbidly obese, wheelchair Skin- rash-none, lesions- none, excoriation- none. + Herpetic fever blister lip Lymphadenopathy- none Head- atraumatic            Eyes- Gross vision intact, PERRLA, conjunctivae and secretions clear            Ears- Hearing, canals-normal            Nose- Clear, no-Septal dev, mucus, polyps, erosion, perforation             Throat- Mallampati  III , mucosa clear , drainage- none, tonsils- atrophic, + dentures Neck- flexible , trachea midline, no stridor , thyroid nl, carotid no bruit Chest - symmetrical excursion , unlabored           Heart/CV- RRR , no murmur , no gallop  , no rub, nl s1 s2                           - JVD- none , edema , stasis changes- none, varices- none           Lung- clear to P&A, wheeze- none, cough- none , dullness-none, rub- none, rales-none,                                Chest wall-  Abd-  Br/ Gen/ Rectal- Not done, not indicated Extrem- cyanosis- none, clubbing, none, atrophy- none, strength- nl, + cane Neuro- grossly intact to observation

## 2019-02-19 NOTE — Telephone Encounter (Signed)
Error

## 2019-02-19 NOTE — Patient Instructions (Signed)
Script for albuterol hfa rescue inhaler   Sample Anoro maintenance inhaler    Inhale 1 puff, once daily.    If this helps your breathing please call us for a prescription.  Order- Walk test on room air- O2 qualifying   Dx dyspnea on exertion  Order- Schedule PFT    Dx Dyspnea on exertion  Please follow up with Dr [name] to discuss your questions about liver function testing, keto diets and your general health

## 2019-02-23 ENCOUNTER — Telehealth: Payer: Self-pay | Admitting: *Deleted

## 2019-02-23 ENCOUNTER — Other Ambulatory Visit (INDEPENDENT_AMBULATORY_CARE_PROVIDER_SITE_OTHER): Payer: Medicare Other

## 2019-02-23 ENCOUNTER — Telehealth: Payer: Self-pay

## 2019-02-23 ENCOUNTER — Other Ambulatory Visit: Payer: Self-pay

## 2019-02-23 DIAGNOSIS — R748 Abnormal levels of other serum enzymes: Secondary | ICD-10-CM

## 2019-02-23 NOTE — Telephone Encounter (Signed)
Reordered lab test. Pt appt was cancelled before I could fix her order & remove her charges. Pt did NOT have labs drawn today.

## 2019-02-23 NOTE — Telephone Encounter (Signed)
Did you call this pt?

## 2019-02-23 NOTE — Telephone Encounter (Signed)
I did not call this pt.

## 2019-02-23 NOTE — Telephone Encounter (Signed)
Copied from [place] [id number]. Topic: General - Other >> Feb 23, 2019  3:24 PM [name] wrote: Reason for CRM: pt is returning call, please call back

## 2019-02-24 ENCOUNTER — Telehealth: Payer: Self-pay | Admitting: Internal Medicine

## 2019-02-24 MED ORDER — ANORO ELLIPTA 62.5-25 MCG/INH IN AEPB: 1.0000 | INHALATION_SPRAY | Freq: Every day | RESPIRATORY_TRACT | 12 refills | Status: DC

## 2019-02-24 NOTE — Telephone Encounter (Signed)
Called and spoke w/ pt. Pt states she has recently completed her sample Anoro and would like a prescription to go to [facility]. I let her know we would get this sent for her.   Order for Anoro has been placed to pt's preferred pharmacy. Nothing further needed at this time.

## 2019-03-04 ENCOUNTER — Telehealth: Payer: Self-pay

## 2019-03-04 NOTE — Telephone Encounter (Signed)
Copied from [place] [id number]. Topic: General - Inquiry >> Mar 04, 2019  2:55 PM Jeri Cos wrote: Reason for CRM: Denise Sharp from [facility] [employer] called inquiring about if there was a fax received for this pt. Please confirm fax being received.

## 2019-03-05 ENCOUNTER — Telehealth: Payer: Self-pay | Admitting: Neurology

## 2019-03-05 NOTE — Telephone Encounter (Signed)
Patient called stating that she needs a refill for putalbacetamin-cass. She stated [name] order it lastt. She states it needs to be order from [place]. She also stated that if he needs to change the headache medicine.he can.

## 2019-03-05 NOTE — Telephone Encounter (Signed)
I reached out to the pt after reviewing the [facility] record. Pt is requesting a refill on her fiorect. Pt claims Dr. Jannifer Franklin originally rx'd this med and [name] started refilling. I could not see a refill on file from Dr. Jannifer Franklin. I advised the pt it could be the refill was originally given before our office transitioned to epic.  Pt was advised Dr. Jannifer Franklin is currently out of the office and will return on 03/09/19. Pt was advised we could have her come into the office next week for an appointment to discuss this medication along with further medication management.  Pt declined to schedule this appointment and will call back tomorrow after her appointment with [name] to see if she would like to schedule. She is going to discuss this med refill and further treatment with her pcp first.   Pt was advised our office would not have clinical staff members in the office but phones would be available from 8-12 pm tomorrow (03/06/2019). Pt verbalized understanding.

## 2019-03-06 ENCOUNTER — Other Ambulatory Visit: Payer: Medicare Other

## 2019-03-06 ENCOUNTER — Encounter: Payer: Self-pay | Admitting: Internal Medicine

## 2019-03-06 ENCOUNTER — Other Ambulatory Visit: Payer: Self-pay

## 2019-03-06 ENCOUNTER — Ambulatory Visit (INDEPENDENT_AMBULATORY_CARE_PROVIDER_SITE_OTHER): Payer: Medicare Other | Admitting: Internal Medicine

## 2019-03-06 VITALS — BP 122/80 | HR 85 | Temp 97.1°F | Resp 16 | Ht 64.5 in | Wt 247.6 lb

## 2019-03-06 DIAGNOSIS — R5383 Other fatigue: Secondary | ICD-10-CM | POA: Diagnosis not present

## 2019-03-06 DIAGNOSIS — E662 Morbid (severe) obesity with alveolar hypoventilation: Secondary | ICD-10-CM

## 2019-03-06 DIAGNOSIS — R05 Cough: Secondary | ICD-10-CM

## 2019-03-06 DIAGNOSIS — G4710 Hypersomnia, unspecified: Secondary | ICD-10-CM | POA: Diagnosis not present

## 2019-03-06 DIAGNOSIS — Z23 Encounter for immunization: Secondary | ICD-10-CM | POA: Diagnosis not present

## 2019-03-06 DIAGNOSIS — E1142 Type 2 diabetes mellitus with diabetic polyneuropathy: Secondary | ICD-10-CM | POA: Diagnosis not present

## 2019-03-06 DIAGNOSIS — R053 Chronic cough: Secondary | ICD-10-CM

## 2019-03-06 MED ORDER — BUPROPION HCL 75 MG PO TABS: 75.0000 mg | ORAL_TABLET | Freq: Two times a day (BID) | ORAL | 0 refills | Status: DC

## 2019-03-06 NOTE — Telephone Encounter (Signed)
Number listed below is a recording and could not get in touch with actual person to let them know that we have not received any paperwork.

## 2019-03-06 NOTE — Patient Instructions (Addendum)
For your early morning waking:  Try using headspace app for learning how to turn your brain off using deep breathing techniques   I am Adding Wellbutrin 75 mg twice daily to energize you first dose at 8:30 am ,  Second dose by 3:30 or 4:00 pm .  We can increase your dose after 2 weeks to 150 mg twice daily.  The rx has been sent to [place] your injections of  B12 to every 2 weeks   I cannot prescribe any additional sedating medications because of medical conditions

## 2019-03-06 NOTE — Progress Notes (Signed)
Subjective:  Patient ID: Denise Sharp, female    DOB: April 14, 1946  Age: 73 y.o. MRN: [id number]  CC: The primary encounter diagnosis was Chronic cough. Diagnoses of DM type 2 with diabetic peripheral neuropathy ([place]), Fatigue, unspecified type, Need for immunization against influenza, Hypersomnia, persistent, and Obesity hypoventilation syndrome ([place]) were also pertinent to this visit.  HPI RYLEE HUESTIS presents for follow up on several issues.     1) insomnia : Chronic .  Takes Lunesta and Diazepam . Goes to bed at 12:00 midnight  And wakes up at 8:30 am to empty bladder.,  Goes back to bed until 10:30   Exhausted all the time. Woken up frequently by husband who gets up  Twice a night , then can't go back to sleep often from 3 to 6 am awake   Has OHS/OSA,  Has gained 20 lbs due to inactivity due to orthopedic issues .   Feels overwhelmed at home.  Walks with a cane .  Short of breath with activity.  Wants to find a housecleaner (discussed [place])  DM: BS are averaging 115 to 130  Without use of Trulicity. i  Chronic diarrhea:   Improved since stopping Trulicity . Stools are mostly formed now   Patient ID: Denise Sharp, female    DOB: 1946-05-15  Age: 73 y.o. MRN: [id number]  The patient is here for annual Medicare wellness examination and management of other chronic and acute problems.   The risk factors are reflected in the social history.  The roster of all physicians providing medical care to patient - is listed in the Snapshot section of the chart.  Activities of daily living:  The patient is 100% independent in all ADLs: dressing, toileting, feeding as well as independent mobility  Home safety : The patient has smoke detectors in the home. They wear seatbelts.  There are no firearms at home. There is no violence in the home.   There is no risks for hepatitis, STDs or HIV. There is no   history of blood transfusion. They have no travel history to infectious disease  endemic areas of the world.  The patient has seen their dentist in the last six month. They have seen their eye doctor in the last year. They admit to slight hearing difficulty with regard to whispered voices and some television programs.  They have deferred audiologic testing in the last year.  They do not  have excessive sun exposure. Discussed the need for sun protection: hats, long sleeves and use of sunscreen if there is significant sun exposure.   Diet: the importance of a healthy diet is discussed. They do have a healthy diet.  The benefits of regular aerobic exercise were discussed. She does not walk for exercise. ty, change in appetite, anhedonia, sadness/tearfullness.  Cognitive assessment: the patient manages all their financial and personal affairs and is actively engaged. They could relate day,date,year and events; recalled 2/3 objects at 3 minutes; performed clock-face test normally.  The following portions of the patient's history were reviewed and updated as appropriate: allergies, current medications, past family history, past medical history,  past surgical history, past social history  and problem list.  Visual acuity was not assessed per patient preference since she has regular follow up with her ophthalmologist. Hearing and body mass index were assessed and reviewed.   During the course of the visit the patient was educated and counseled about appropriate screening and preventive services including : fall prevention , diabetes  screening, nutrition counseling, colorectal cancer screening, and recommended immunizations.    CC: The primary encounter diagnosis was Chronic cough. Diagnoses of DM type 2 with diabetic peripheral neuropathy ([place]), Fatigue, unspecified type, Need for immunization against influenza, Hypersomnia, persistent, and Obesity hypoventilation syndrome ([place]) were also pertinent to this visit.  History Ashlynd has a past medical history of Abnormality of gait  (03/25/2013), Adrenal mass, left ([place]), Anemia, Arthritis, Atypical chest pain, Cervical spinal stenosis (1994), Cervicogenic headache (03/23/2014), Depression, Dizziness, DJD (degenerative joint disease), Esophageal stenosis (March 2011), Family history of adverse reaction to anesthesia, Gastric bypass status for obesity, Headache(784.0), Hypertension, IBS (irritable bowel syndrome), Left bundle branch block (LBBB), Obesity, Obstructive sleep apnea, Polyneuropathy in diabetes(357.2) (03/25/2013), Restless leg syndrome, Rotator cuff arthropathy, right (08/13/2017), Syncope and collapse (03/12/2014), and Type II diabetes mellitus ([place]).   She has a past surgical history that includes Gastric bypass; Abdominal hysterectomy; Gastric bypass (2000, 2005); Appendectomy; Gallbladder surgery (1976); Rotator cuff repair; Joint replacement (2007); Spine surgery (1995); Interstim Implant placement (April 2013); Gallbladder surgery (resection); Total abdominal hysterectomy w/ bilateral salpingoophorectomy (1974); Umbilical hernia repair (Aug 11, 2015); Diaphragmatic hernia repair (2015); Colonoscopy; Esophagogastroduodenoscopy; Esophageal dilation; and Reverse shoulder arthroplasty (Right, 08/13/2017).   Her family history includes Depression in her mother; Diabetes in her daughter; Headache in her mother; Heart disease in her daughter, father, and mother; Hypertension in her daughter, father, mother, and son; Osteoporosis in her father; Prostate cancer in her father; Stroke in her father and mother; Thyroid disease in her mother.She reports that she has never smoked. She has never used smokeless tobacco. She reports that she does not drink alcohol or use drugs.  Outpatient Medications Prior to Visit  Medication Sig Dispense Refill   albuterol (PROVENTIL HFA;VENTOLIN HFA) 108 (90 Base) MCG/ACT inhaler Inhale 2 puffs into the lungs every 8 (eight) hours as needed for wheezing or shortness of breath. 1 Inhaler 0   aspirin EC  81 MG tablet Take 1 tablet (81 mg total) by mouth daily. (Patient taking differently: Take 81 mg by mouth 2 (two) times daily. ) 90 tablet 3   benzonatate (TESSALON) 200 MG capsule Take 1 capsule (200 mg total) by mouth 2 (two) times daily as needed for cough. 30 capsule 0   Blood Glucose Monitoring Suppl (ONETOUCH VERIO) w/Device KIT 1 Device by Does not apply route daily. Use to test blood glucose once daily. Dx code = E11.9 1 kit 0   butalbital-acetaminophen-caffeine (FIORICET) 50-325-40 MG tablet Take 1 tablet by mouth every 6 (six) hours as needed for headache. 180 tablet 5   cholestyramine light (PREVALITE) 4 g packet Take by mouth.     clotrimazole-betamethasone (LOTRISONE) cream Apply 1 application topically 2 (two) times daily. 45 g 3   cyanocobalamin (,VITAMIN B-12,) 1000 MCG/ML injection Inject 1 ml (1000 mcg ) IM weekly x 4,  Then monthly thereafter 110 mL 0   diazepam (VALIUM) 5 MG tablet Take 1 tablet (5 mg total) by mouth at bedtime as needed for anxiety. 30 tablet 5   dicyclomine (BENTYL) 10 MG capsule Take 1 capsule (10 mg total) by mouth 4 (four) times daily -  before meals and at bedtime. 360 capsule 3   Eszopiclone 3 MG TABS Take 1 tablet (3 mg total) by mouth at bedtime. 90 tablet 1   Insulin Glargine (LANTUS SOLOSTAR) 100 UNIT/ML Solostar Pen Inject 11 Units into the skin daily. 5 pen 3   Insulin Pen Needle 31G X 5 MM MISC Use to inject insulin  daily 30 each 1   Lancet Devices (ONE TOUCH DELICA LANCING DEV) MISC Use to test blood glucose once daily. Dx code = E11.9 1 each 0   levETIRAcetam (KEPPRA) 750 MG tablet Take 1 tablet (750 mg total) by mouth at bedtime. 90 tablet 3   NONFORMULARY OR COMPOUNDED ITEM [facility]  Combination Pain Cream -  Baclofen 2%, Doxepin 5%, Gabapentin 6%, Topiramate 2%, Pentoxifylline 3% Apply 1-2 grams to affected area 3-4 times daily Qty. 120 gm 3 refills 1 each 3   nystatin (NYAMYC) powder Apply topically 4 (four) times  daily.     ondansetron (ZOFRAN) 4 MG tablet Take 1 tablet (4 mg total) by mouth every 8 (eight) hours as needed for nausea or vomiting. 20 tablet 0   ONETOUCH DELICA LANCETS FINE MISC Use to test blood sguar once daily. Dx code = E11.9 100 each 3   ONETOUCH VERIO test strip USE TO TEST BLOOD SUGAR ONCE DAILY. DX CODE = E11.9 100 strip 12   pregabalin (LYRICA) 100 MG capsule Take 100 mg by mouth daily.     rosuvastatin (CRESTOR) 10 MG tablet Take 1 tablet (10 mg total) by mouth daily. 90 tablet 3   Syringe/Needle, Disp, (SYRINGE 3CC/25GX1") 25G X 1" 3 ML MISC Use for b12 injections 50 each 0   traMADol (ULTRAM) 50 MG tablet Take 0.5 tablets (25 mg total) by mouth every 12 (twelve) hours as needed for severe pain. 30 tablet 0   umeclidinium-vilanterol (ANORO ELLIPTA) 62.5-25 MCG/INH AEPB Inhale 1 puff into the lungs daily. 1 each 12   No facility-administered medications prior to visit.     Review of Systems   Patient denies headache, fevers, malaise, unintentional weight loss, skin rash, eye pain, sinus congestion and sinus pain, sore throat, dysphagia,  hemoptysis , cough, dyspnea, wheezing, chest pain, palpitations, orthopnea, edema, abdominal pain, nausea, melena, diarrhea, constipation, flank pain, dysuria, hematuria, urinary  Frequency, nocturia, numbness, tingling, seizures,  Focal weakness, Loss of consciousness,  Tremor, insomnia, depression, anxiety, and suicidal ideation.      Objective:  BP 122/80 (BP Location: Left Arm, Patient Position: Sitting, Cuff Size: Normal)    Pulse 85    Temp (!) 97.1 F (36.2 C) (Oral)    Resp 16    Ht 5' 4.5" (1.638 m)    Wt 247 lb 9.6 oz (112.3 kg)    SpO2 94%    BMI 41.84 kg/m   Physical Exam   General appearance: alert, cooperative and appears stated age Ears: normal TM's and external ear canals both ears Throat: lips, mucosa, and tongue normal; teeth and gums normal Neck: no adenopathy, no carotid bruit, supple, symmetrical, trachea  midline and thyroid not enlarged, symmetric, no tenderness/mass/nodules Back: symmetric, no curvature. ROM normal. No CVA tenderness. Lungs: clear to auscultation bilaterally Heart: regular rate and rhythm, S1, S2 normal, no murmur, click, rub or gallop Abdomen: soft, non-tender; bowel sounds normal; no masses,  no organomegaly Pulses: 2+ and symmetric Skin: Skin color, texture, turgor normal. No rashes or lesions Lymph nodes: Cervical, supraclavicular, and axillary nodes normal.    Assessment & Plan:   Problem List Items Addressed This Visit      Unprioritized   Obesity hypoventilation syndrome ([place])    Adding wellbutrin .  No titration of anti insomnia medications      Hypersomnia, persistent    Adding wellbutrin for stimulatory effect and encouraged to stay as active as her orthopedic issues       Chronic  cough - Primary   DM type 2 with diabetic peripheral neuropathy (HCC)   Relevant Medications   buPROPion (WELLBUTRIN) 75 MG tablet   Other Relevant Orders   Comprehensive metabolic panel (Completed)   Hemoglobin A1c (Completed)    Other Visit Diagnoses    Fatigue, unspecified type       Relevant Orders   Iron, TIBC and Ferritin Panel (Completed)   Need for immunization against influenza       Relevant Orders   Flu Vaccine QUAD High Dose(Fluad) (Completed)      I am having Tia Masker. Hinsch start on buPROPion. I am also having her maintain her albuterol, aspirin EC, traMADol, nystatin, pregabalin, SYRINGE 3CC/25GX1", NONFORMULARY OR COMPOUNDED ITEM, ONE TOUCH DELICA LANCING DEV, OneTouch Delica Lancets Fine, clotrimazole-betamethasone, Insulin Glargine, rosuvastatin, ondansetron, Insulin Pen Needle, levETIRAcetam, cyanocobalamin, cholestyramine light, butalbital-acetaminophen-caffeine, dicyclomine, Eszopiclone, diazepam, OneTouch Verio, benzonatate, OneTouch Verio, and Anoro Ellipta.  Meds ordered this encounter  Medications   buPROPion (WELLBUTRIN) 75 MG tablet     Sig: Take 1 tablet (75 mg total) by mouth 2 (two) times daily.    Dispense:  60 tablet    Refill:  0    There are no discontinued medications.  Follow-up: No follow-ups on file.   [name], MD naps   Elevated BS  Outpatient Medications Prior to Visit  Medication Sig Dispense Refill   albuterol (PROVENTIL HFA;VENTOLIN HFA) 108 (90 Base) MCG/ACT inhaler Inhale 2 puffs into the lungs every 8 (eight) hours as needed for wheezing or shortness of breath. 1 Inhaler 0   aspirin EC 81 MG tablet Take 1 tablet (81 mg total) by mouth daily. (Patient taking differently: Take 81 mg by mouth 2 (two) times daily. ) 90 tablet 3   benzonatate (TESSALON) 200 MG capsule Take 1 capsule (200 mg total) by mouth 2 (two) times daily as needed for cough. 30 capsule 0   Blood Glucose Monitoring Suppl (ONETOUCH VERIO) w/Device KIT 1 Device by Does not apply route daily. Use to test blood glucose once daily. Dx code = E11.9 1 kit 0   butalbital-acetaminophen-caffeine (FIORICET) 50-325-40 MG tablet Take 1 tablet by mouth every 6 (six) hours as needed for headache. 180 tablet 5   cholestyramine light (PREVALITE) 4 g packet Take by mouth.     clotrimazole-betamethasone (LOTRISONE) cream Apply 1 application topically 2 (two) times daily. 45 g 3   cyanocobalamin (,VITAMIN B-12,) 1000 MCG/ML injection Inject 1 ml (1000 mcg ) IM weekly x 4,  Then monthly thereafter 110 mL 0   diazepam (VALIUM) 5 MG tablet Take 1 tablet (5 mg total) by mouth at bedtime as needed for anxiety. 30 tablet 5   dicyclomine (BENTYL) 10 MG capsule Take 1 capsule (10 mg total) by mouth 4 (four) times daily -  before meals and at bedtime. 360 capsule 3   Eszopiclone 3 MG TABS Take 1 tablet (3 mg total) by mouth at bedtime. 90 tablet 1   Insulin Glargine (LANTUS SOLOSTAR) 100 UNIT/ML Solostar Pen Inject 11 Units into the skin daily. 5 pen 3   Insulin Pen Needle 31G X 5 MM MISC Use to inject insulin daily 30 each 1   Lancet Devices  (ONE TOUCH DELICA LANCING DEV) MISC Use to test blood glucose once daily. Dx code = E11.9 1 each 0   levETIRAcetam (KEPPRA) 750 MG tablet Take 1 tablet (750 mg total) by mouth at bedtime. 90 tablet 3   NONFORMULARY OR COMPOUNDED [place] [facility]  Combination Pain Cream -  Baclofen 2%, Doxepin 5%, Gabapentin 6%, Topiramate 2%, Pentoxifylline 3% Apply 1-2 grams to affected area 3-4 times daily Qty. 120 gm 3 refills 1 each 3   nystatin (NYAMYC) powder Apply topically 4 (four) times daily.     ondansetron (ZOFRAN) 4 MG tablet Take 1 tablet (4 mg total) by mouth every 8 (eight) hours as needed for nausea or vomiting. 20 tablet 0   ONETOUCH DELICA LANCETS FINE MISC Use to test blood sguar once daily. Dx code = E11.9 100 each 3   ONETOUCH VERIO test strip USE TO TEST BLOOD SUGAR ONCE DAILY. DX CODE = E11.9 100 strip 12   pregabalin (LYRICA) 100 MG capsule Take 100 mg by mouth daily.     rosuvastatin (CRESTOR) 10 MG tablet Take 1 tablet (10 mg total) by mouth daily. 90 tablet 3   Syringe/Needle, Disp, (SYRINGE 3CC/25GX1") 25G X 1" 3 ML MISC Use for b12 injections 50 each 0   traMADol (ULTRAM) 50 MG tablet Take 0.5 tablets (25 mg total) by mouth every 12 (twelve) hours as needed for severe pain. 30 tablet 0   umeclidinium-vilanterol (ANORO ELLIPTA) 62.5-25 MCG/INH AEPB Inhale 1 puff into the lungs daily. 1 each 12   No facility-administered medications prior to visit.     Review of Systems;  Patient denies headache, fevers, malaise, unintentional weight loss, skin rash, eye pain, sinus congestion and sinus pain, sore throat, dysphagia,  hemoptysis , cough, dyspnea, wheezing, chest pain, palpitations, orthopnea, edema, abdominal pain, nausea, melena, diarrhea, constipation, flank pain, dysuria, hematuria, urinary  Frequency, nocturia, numbness, tingling, seizures,  Focal weakness, Loss of consciousness,  Tremor, insomnia, depression, anxiety, and suicidal ideation.      Objective:    BP 122/80 (BP Location: Left Arm, Patient Position: Sitting, Cuff Size: Normal)    Pulse 85    Temp (!) 97.1 F (36.2 C) (Oral)    Resp 16    Ht 5' 4.5" (1.638 m)    Wt 247 lb 9.6 oz (112.3 kg)    SpO2 94%    BMI 41.84 kg/m   BP Readings from Last 3 Encounters:  03/06/19 122/80  02/19/19 122/80  12/25/18 139/70    Wt Readings from Last 3 Encounters:  03/06/19 247 lb 9.6 oz (112.3 kg)  02/19/19 250 lb 3.2 oz (113.5 kg)  12/25/18 236 lb (107 kg)    General appearance: alert, cooperative and appears stated age Ears: normal TM's and external ear canals both ears Throat: lips, mucosa, and tongue normal; teeth and gums normal Neck: no adenopathy, no carotid bruit, supple, symmetrical, trachea midline and thyroid not enlarged, symmetric, no tenderness/mass/nodules Back: symmetric, no curvature. ROM normal. No CVA tenderness. Lungs: clear to auscultation bilaterally Heart: regular rate and rhythm, S1, S2 normal, no murmur, click, rub or gallop Abdomen: soft, non-tender; bowel sounds normal; no masses,  no organomegaly Pulses: 2+ and symmetric Skin: Skin color, texture, turgor normal. No rashes or lesions Lymph nodes: Cervical, supraclavicular, and axillary nodes normal.  Lab Results  Component Value Date   HGBA1C 7.4 (H) 03/06/2019   HGBA1C 6.5 08/14/2018   HGBA1C 7.9 (H) 02/24/2018    Lab Results  Component Value Date   CREATININE 0.88 03/06/2019   CREATININE 0.95 01/01/2019   CREATININE 0.93 10/22/2018    Lab Results  Component Value Date   WBC 8.3 01/01/2019   HGB 12.1 01/01/2019   HCT 37.7 01/01/2019   PLT 295.0 01/01/2019   GLUCOSE 122 (H) 03/06/2019  CHOL 193 02/24/2018   TRIG 197.0 (H) 02/24/2018   HDL 51.40 02/24/2018   LDLDIRECT 111.0 06/16/2015   LDLCALC 102 (H) 02/24/2018   ALT 9 03/06/2019   AST 14 03/06/2019   NA 141 03/06/2019   K 4.0 03/06/2019   CL 103 03/06/2019   CREATININE 0.88 03/06/2019   BUN 12 03/06/2019   CO2 23 03/06/2019   TSH 3.50  01/01/2019   HGBA1C 7.4 (H) 03/06/2019   MICROALBUR 2.8 (H) 06/04/2017    Dg Chest Portable 1 View  Result Date: 10/15/2018 CLINICAL DATA:  Initial evaluation for acute shortness of breath, fever. EXAM: PORTABLE CHEST 1 VIEW COMPARISON:  Prior radiograph from 11/19/2017. FINDINGS: Mild exaggeration of the cardiac silhouette related AP technique. Transverse heart size at the upper limits of normal. Mediastinal silhouette within normal limits. Lungs normally inflated. Mild left basilar atelectasis and/or scarring. No focal infiltrates. No pulmonary edema or pleural effusion. No pneumothorax. No acute osseous finding. Right shoulder arthroplasty partially visualized. IMPRESSION: Mild left basilar atelectasis and/or scarring. No other active cardiopulmonary disease. Electronically Signed   By: Jeannine Boga M.D.   On: 10/15/2018 22:56    Assessment & Plan:   Problem List Items Addressed This Visit      Unprioritized   Obesity hypoventilation syndrome ([place])    Adding wellbutrin .  No titration of anti insomnia medications      Hypersomnia, persistent    Adding wellbutrin for stimulatory effect and encouraged to stay as active as her orthopedic issues       Chronic cough - Primary   DM type 2 with diabetic peripheral neuropathy ([place])   Relevant Medications   buPROPion (WELLBUTRIN) 75 MG tablet   Other Relevant Orders   Comprehensive metabolic panel (Completed)   Hemoglobin A1c (Completed)    Other Visit Diagnoses    Fatigue, unspecified type       Relevant Orders   Iron, TIBC and Ferritin Panel (Completed)   Need for immunization against influenza       Relevant Orders   Flu Vaccine QUAD High Dose(Fluad) (Completed)     A total of 25 minutes of face to face time was spent with patient more than half of which was spent in counselling about the above mentioned conditions  and coordination of care   I am having [name] D. Lope start on buPROPion. I am also having her  maintain her albuterol, aspirin EC, traMADol, nystatin, pregabalin, SYRINGE 3CC/25GX1", NONFORMULARY OR COMPOUNDED ITEM, ONE TOUCH DELICA LANCING DEV, OneTouch Delica Lancets Fine, clotrimazole-betamethasone, Insulin Glargine, rosuvastatin, ondansetron, Insulin Pen Needle, levETIRAcetam, cyanocobalamin, cholestyramine light, butalbital-acetaminophen-caffeine, dicyclomine, Eszopiclone, diazepam, OneTouch Verio, benzonatate, OneTouch Verio, and Anoro Ellipta.  Meds ordered this encounter  Medications   buPROPion (WELLBUTRIN) 75 MG tablet    Sig: Take 1 tablet (75 mg total) by mouth 2 (two) times daily.    Dispense:  60 tablet    Refill:  0    There are no discontinued medications.  Follow-up: No follow-ups on file.   [name], MD

## 2019-03-07 LAB — COMPREHENSIVE METABOLIC PANEL
AG Ratio: 1.3 (calc) (ref 1.0–2.5)
ALT: 9 U/L (ref 6–29)
AST: 14 U/L (ref 10–35)
Albumin: 3.8 g/dL (ref 3.6–5.1)
Alkaline phosphatase (APISO): 115 U/L (ref 37–153)
BUN: 12 mg/dL (ref 7–25)
CO2: 23 mmol/L (ref 20–32)
Calcium: 8.9 mg/dL (ref 8.6–10.4)
Chloride: 103 mmol/L (ref 98–110)
Creat: 0.88 mg/dL (ref 0.60–0.93)
Globulin: 3 g/dL (calc) (ref 1.9–3.7)
Glucose, Bld: 122 mg/dL — ABNORMAL HIGH (ref 65–99)
Potassium: 4 mmol/L (ref 3.5–5.3)
Sodium: 141 mmol/L (ref 135–146)
Total Bilirubin: 0.3 mg/dL (ref 0.2–1.2)
Total Protein: 6.8 g/dL (ref 6.1–8.1)

## 2019-03-07 LAB — IRON,TIBC AND FERRITIN PANEL
%SAT: 8 % (calc) — ABNORMAL LOW (ref 16–45)
Ferritin: 22 ng/mL (ref 16–288)
Iron: 30 ug/dL — ABNORMAL LOW (ref 45–160)
TIBC: 373 mcg/dL (calc) (ref 250–450)

## 2019-03-07 LAB — HEMOGLOBIN A1C
Hgb A1c MFr Bld: 7.4 % of total Hgb — ABNORMAL HIGH (ref <5.7)
Mean Plasma Glucose: 166 (calc)
eAG (mmol/L): 9.2 (calc)

## 2019-03-08 NOTE — Assessment & Plan Note (Signed)
Adding wellbutrin .  No titration of anti insomnia medications

## 2019-03-08 NOTE — Assessment & Plan Note (Signed)
Adding wellbutrin for stimulatory effect and encouraged to stay as active as her orthopedic issues

## 2019-03-12 ENCOUNTER — Telehealth: Payer: Self-pay

## 2019-03-12 NOTE — Telephone Encounter (Signed)
Copied from [facility] [id number]. Topic: General - Inquiry >> Mar 04, 2019  2:55 PM Jeri Cos wrote: Reason for CRM: Karena Addison from [facility] [employer] called inquiring about if there was a fax received for this pt. Please confirm fax being received. >> Mar 12, 2019 11:37 AM Ivar Drape wrote: Karena Addison from [facility] [employer] called again inquiring about if there was a fax received for this pt. She will fax again today.

## 2019-03-13 NOTE — Telephone Encounter (Signed)
Received fax.

## 2019-03-18 ENCOUNTER — Ambulatory Visit: Payer: Medicare Other | Admitting: Internal Medicine

## 2019-03-25 NOTE — Telephone Encounter (Signed)
Called pt to see if she had requested the cardiovascular genetic testing from [facility]. Pt stated that they did call her but that she did not want the testing done because she didn't think it was necessary. Called [facility] back to let them know that pt was not interested in the testing.

## 2019-03-25 NOTE — Telephone Encounter (Signed)
[facility] is call again to get the status of forms that were faxed and received on 03/13/19.  They still have not heard anything and patient needs this taken care of as soon as possible.  Please call to discuss at [phone], ext. [phone]

## 2019-03-29 ENCOUNTER — Other Ambulatory Visit: Payer: Self-pay | Admitting: Internal Medicine

## 2019-03-31 ENCOUNTER — Telehealth: Payer: Self-pay | Admitting: Internal Medicine

## 2019-03-31 ENCOUNTER — Other Ambulatory Visit: Payer: Self-pay | Admitting: Internal Medicine

## 2019-03-31 DIAGNOSIS — E1142 Type 2 diabetes mellitus with diabetic polyneuropathy: Secondary | ICD-10-CM

## 2019-03-31 DIAGNOSIS — R059 Cough, unspecified: Secondary | ICD-10-CM

## 2019-03-31 DIAGNOSIS — R05 Cough: Secondary | ICD-10-CM

## 2019-03-31 MED ORDER — DIAZEPAM 5 MG PO TABS: 5.0000 mg | ORAL_TABLET | Freq: Every evening | ORAL | 5 refills | Status: DC | PRN

## 2019-03-31 MED ORDER — LANTUS SOLOSTAR 100 UNIT/ML ~~LOC~~ SOPN: 11.0000 [IU] | PEN_INJECTOR | Freq: Every day | SUBCUTANEOUS | 3 refills | Status: DC

## 2019-03-31 MED ORDER — ESZOPICLONE 3 MG PO TABS: 3.0000 mg | ORAL_TABLET | Freq: Every day | ORAL | 1 refills | Status: DC

## 2019-03-31 MED ORDER — BENZONATATE 200 MG PO CAPS: 200.0000 mg | ORAL_CAPSULE | Freq: Two times a day (BID) | ORAL | 0 refills | Status: DC | PRN

## 2019-03-31 MED ORDER — INSULIN PEN NEEDLE 31G X 5 MM MISC: 1 refills | Status: DC

## 2019-03-31 NOTE — Telephone Encounter (Signed)
Diazepam refill sent to [facility].  Can tear up the printed rx.  The rest of her meds went to [facility]

## 2019-03-31 NOTE — Telephone Encounter (Signed)
Requested medication (s) are due for refill today: yes  Requested medication (s) are on the active medication list: yes   Future visit scheduled: no  Notes to clinic: review for refill   Requested Prescriptions  Pending Prescriptions Disp Refills   benzonatate (TESSALON) 200 MG capsule 30 capsule 0    Sig: Take 1 capsule (200 mg total) by mouth 2 (two) times daily as needed for cough.     Ear, Nose, and Throat:  Antitussives/Expectorants Passed - 03/31/2019  9:15 AM      Passed - Valid encounter within last 12 months    Recent Outpatient Visits          3 weeks ago Chronic cough   [facility] [place] [name], MD   1 month ago Suspected 2019 Novel Coronavirus Infection   [place] Guse, Jacquelynn Cree, FNP   3 months ago B12 deficiency   [place] [name], MD   5 months ago Functional diarrhea   [place] [name], MD   7 months ago Preoperative cardiovascular examination   [place] [name], MD      Future Appointments            In 2 months Melvyn Novas Christena Deem, MD [facility]   In 9 months O'Brien-Blaney, Denisa L, LPN [facility] [place]            Insulin Glargine (LANTUS SOLOSTAR) 100 UNIT/ML Solostar Pen 5 pen 3    Sig: Inject 11 Units into the skin daily.     Endocrinology:  Diabetes - Insulins Passed - 03/31/2019  9:15 AM      Passed - HBA1C is between 0 and 7.9 and within 180 days    Hemoglobin A1C  Date Value Ref Range Status  08/14/2018 6.5  Final   Hgb A1c MFr Bld  Date Value Ref Range Status  03/06/2019 7.4 (H) <5.7 % of total Hgb Final    Comment:    For someone without known diabetes, a hemoglobin A1c value of 6.5% or greater indicates that they may have  diabetes and this should be confirmed with a follow-up  test. . For someone with known diabetes, a value <7% indicates  that their diabetes is  well controlled and a value  greater than or equal to 7% indicates suboptimal  control. A1c targets should be individualized based on  duration of diabetes, age, comorbid conditions, and  other considerations. . Currently, no consensus exists regarding use of hemoglobin A1c for diagnosis of diabetes for children. Renella Cunas - Valid encounter within last 6 months    Recent Outpatient Visits          3 weeks ago Chronic cough   [facility] [place], MD   1 month ago Suspected 2019 Novel Coronavirus Infection   [facility] [place] Guse, Jacquelynn Cree, FNP   3 months ago B12 deficiency   [place] [name], MD   5 months ago Functional diarrhea   [place] [name], MD   7 months ago Preoperative cardiovascular examination   [place] [name], MD      Future Appointments            In 2 months Melvyn Novas, Christena Deem, MD [facility]  In 9 months O'Brien-Blaney, Denisa L, LPN [facility] [place]            Insulin Pen Needle 31G X 5 MM MISC 30 each 1    Sig: Use to inject insulin daily     Endocrinology: Diabetes - Testing Supplies Passed - 03/31/2019  9:15 AM      Passed - Valid encounter within last 12 months    Recent Outpatient Visits          3 weeks ago Chronic cough   [facility] [place] [name], MD   1 month ago Suspected 2019 Novel Coronavirus Infection   [place] Guse, Jacquelynn Cree, FNP   3 months ago B12 deficiency   [place] [name], MD   5 months ago Functional diarrhea   [place] [name], MD   7 months ago Preoperative cardiovascular examination   [place] [name], MD      Future Appointments            In 2 months Melvyn Novas, Christena Deem, MD [facility]   In 9 months  O'Brien-Blaney, Denisa L, LPN [facility] [place], [facility]            diazepam (VALIUM) 5 MG tablet 30 tablet 5    Sig: Take 1 tablet (5 mg total) by mouth at bedtime as needed for anxiety.     Not Delegated - Psychiatry:  Anxiolytics/Hypnotics Failed - 03/31/2019  9:15 AM      Failed - This refill cannot be delegated      Failed - Urine Drug Screen completed in last 360 days.      Passed - Valid encounter within last 6 months    Recent Outpatient Visits          3 weeks ago Chronic cough   [place] [name], MD   1 month ago Suspected 2019 Novel Coronavirus Infection   [place] Guse, Jacquelynn Cree, FNP   3 months ago B12 deficiency   [place] [name], MD   5 months ago Functional diarrhea   [place] [name], MD   7 months ago Preoperative cardiovascular examination   [place] [name], MD      Future Appointments            In 2 months Melvyn Novas, Christena Deem, MD [facility]   In 9 months O'Brien-Blaney, Denisa L, LPN [facility] [place], [facility]            Eszopiclone 3 MG TABS 90 tablet 1    Sig: Take 1 tablet (3 mg total) by mouth at bedtime.     Not Delegated - Psychiatry:  Anxiolytics/Hypnotics Failed - 03/31/2019  9:15 AM      Failed - This refill cannot be delegated      Failed - Urine Drug Screen completed in last 360 days.      Passed - Valid encounter within last 6 months    Recent Outpatient Visits          3 weeks ago Chronic cough   [place], MD   1 month ago Suspected 2019 Novel Coronavirus Infection   [place], FNP   3 months ago B12 deficiency  [place] [name], MD   5 months ago Functional diarrhea   [place] [name], MD   7 months ago Preoperative  cardiovascular examination   [place] [name], MD      Future Appointments            In 2 months [place], Christena Deem, MD [facility]   In 9 months O'Brien-Blaney, Bryson Corona, LPN [place], [place]

## 2019-03-31 NOTE — Telephone Encounter (Signed)
Pt called and stated and stated that she will have to have all controlled substances have to be faxed over instead of electronically.    diazepam (VALIUM) 5 MG tablet [[id number]] Eszopiclone 3 MG TABS [[id number]]    Pharmacy:  [facility] [place], [place] [address] (Phone) [phone] (Fax)    Pt will also need medications sent over electronically   benzonatate (TESSALON) 200 MG capsule [[id number]]  citalopram (CELEXA) 10 MG tablet [[id number]] Insulin Glargine (LANTUS SOLOSTAR) 100 UNIT/ML Solostar Pen [[id number]] and needles   [facility] - [place], [place] - [address] [phone] (Phone) [phone] (Fax)

## 2019-04-01 NOTE — Telephone Encounter (Signed)
Pt called to state that [facility], including [facility] by fax to [facility]. Please advise

## 2019-04-01 NOTE — Telephone Encounter (Signed)
noted 

## 2019-04-02 NOTE — Telephone Encounter (Signed)
LMTCB. Need to clarify the message about where rxs need to be sent.

## 2019-04-02 NOTE — Telephone Encounter (Signed)
Patient is upset and is requesting the diazepam prescription and all other controlled substance to be printed and faxed to [facility].  The patient states that all other [facility].

## 2019-04-03 NOTE — Telephone Encounter (Signed)
I have faxed to [facility].

## 2019-04-03 NOTE — Telephone Encounter (Signed)
I signed the controlled substance orders a few days ago,  They will be for 30 days  At a time and yes they can be sent [facility] via fax

## 2019-04-05 NOTE — Assessment & Plan Note (Signed)
Benefits from CPAP with good compliance and control. 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  We will reassess, consider ONOX on CPAP.

## 2019-04-05 NOTE — Assessment & Plan Note (Signed)
Hard to get distinct picture from her. Plan- PFT, sample trial Anoro for effect, script fro rescue inhaler wth med talk.

## 2019-04-06 ENCOUNTER — Other Ambulatory Visit: Payer: Self-pay | Admitting: Internal Medicine

## 2019-04-06 MED ORDER — ONETOUCH VERIO VI STRP: ORAL_STRIP | 12 refills | Status: DC

## 2019-04-06 NOTE — Telephone Encounter (Signed)
Medication Refill - Medication:  ONETOUCH VERIO test strip   Has the patient contacted their pharmacy? Yes advised to call office.   Preferred Pharmacy (with phone number or street name):  [facility] - [place], [place] - [address] (Phone) [phone] (Fax)   Agent: Please be advised that RX refills may take up to 3 business days. We ask that you follow-up with your pharmacy.

## 2019-04-12 ENCOUNTER — Other Ambulatory Visit: Payer: Self-pay | Admitting: Internal Medicine

## 2019-04-17 ENCOUNTER — Telehealth: Payer: Self-pay | Admitting: Internal Medicine

## 2019-04-17 MED ORDER — CITALOPRAM HYDROBROMIDE 10 MG PO TABS: 10.0000 mg | ORAL_TABLET | Freq: Every day | ORAL | 3 refills | Status: DC

## 2019-04-17 MED ORDER — CITALOPRAM HYDROBROMIDE 10 MG PO TABS: 10.0000 mg | ORAL_TABLET | Freq: Every day | ORAL | 0 refills | Status: DC

## 2019-04-17 NOTE — Telephone Encounter (Signed)
Citalopram is not in the pt's current medication list and I dont see any documentation that the pt is supposed to still be taking this medication.   Last refilled: 01/2018

## 2019-04-17 NOTE — Telephone Encounter (Signed)
Medication Refill - Medication: Citalopram 10MG /Pt stated she has been trying to get medication refilled for 1 month. Stated [name] told her she needs to take this medication every day and now she is down to one tablet. She is requesting a short supply rx for 3 or 4 weeks to be [facility] as she only has one tablet left for today and her regular rx to be sent to [facility] as it will take 3 weeks to get to her.  Has the patient contacted their pharmacy? Yes.   (Agent: If no, request that the patient contact the pharmacy for the refill.) (Agent: If yes, when and what did the pharmacy advise?)  Preferred Pharmacy (with phone number or street name):  [id number] [name], [place] [phone] (Phone) [phone] (Fax)   [facility] [place], [place] - [address] [phone] (Phone) [phone] (Fax)    Agent: Please be advised that RX refills may take up to 3 business days. We ask that you follow-up with your pharmacy.

## 2019-04-17 NOTE — Telephone Encounter (Signed)
The patient is requesting a 3 to 4 week supply of Citalopram 10 mg  [id number] [name], [place] [phone] (Phone) [phone] (Fax)     And the rest sent to [facility], she has 1 tablet left   Requested medication is not on current med list..

## 2019-04-17 NOTE — Telephone Encounter (Signed)
ANOTHER Epic FLAW.  ONCE A PATIENT RUNS OUT IT AUTOMATICALLY DISCONTINUES IT!  [facility]  [facility] DAY S[facility] TO [facility]

## 2019-04-21 ENCOUNTER — Other Ambulatory Visit: Payer: Self-pay | Admitting: *Deleted

## 2019-04-28 ENCOUNTER — Telehealth: Payer: Self-pay

## 2019-04-28 DIAGNOSIS — E65 Localized adiposity: Secondary | ICD-10-CM

## 2019-04-28 NOTE — Telephone Encounter (Signed)
Copied from [place] [phone]. Topic: General - Other >> Apr 28, 2019  2:53 PM Wynetta Emery, [place] C wrote: Reason for CRM: pt called in to request a call back. Pt says that she need assistance with finding a different surgeon for plastic surgery. Pt says that she was suppose to be next in line for surgery but then due to covid she was put off until January. Pt says that she doesn't want to wait until then.    Please assist further

## 2019-04-29 ENCOUNTER — Telehealth: Payer: Self-pay | Admitting: Internal Medicine

## 2019-04-29 NOTE — Telephone Encounter (Signed)
No,  Tell her that if she starts over with another surgeon, her surgery will be pushed back even more .  She needs to stay with the surgeon  She has

## 2019-04-29 NOTE — Telephone Encounter (Signed)
Disregard previous message.  Patient's message misunderstood,  I have replled directly to patient

## 2019-04-29 NOTE — Telephone Encounter (Signed)
Should pt schedule a virtual visit to discuss with you?

## 2019-04-30 ENCOUNTER — Other Ambulatory Visit: Payer: Self-pay

## 2019-04-30 ENCOUNTER — Other Ambulatory Visit
Admission: RE | Admit: 2019-04-30 | Discharge: 2019-04-30 | Disposition: A | Discharge status: Home / Self Care | Payer: Medicare Other | Source: Ambulatory Visit | Attending: Internal Medicine | Admitting: Internal Medicine

## 2019-04-30 DIAGNOSIS — Z20828 Contact with and (suspected) exposure to other viral communicable diseases: Secondary | ICD-10-CM | POA: Diagnosis not present

## 2019-04-30 DIAGNOSIS — Z01812 Encounter for preprocedural laboratory examination: Secondary | ICD-10-CM | POA: Insufficient documentation

## 2019-04-30 NOTE — Telephone Encounter (Signed)
Per Dr. Lupita Dawn other message she said to disregard this message she has responded directly to the pt via mychart.

## 2019-05-01 LAB — SARS CORONAVIRUS 2 (TAT 6-24 HRS): SARS Coronavirus 2: NEGATIVE

## 2019-05-04 ENCOUNTER — Encounter: Payer: Self-pay | Admitting: Podiatry

## 2019-05-04 ENCOUNTER — Ambulatory Visit (INDEPENDENT_AMBULATORY_CARE_PROVIDER_SITE_OTHER): Payer: Medicare Other | Admitting: Internal Medicine

## 2019-05-04 ENCOUNTER — Other Ambulatory Visit: Payer: Self-pay

## 2019-05-04 ENCOUNTER — Ambulatory Visit (INDEPENDENT_AMBULATORY_CARE_PROVIDER_SITE_OTHER): Payer: Medicare Other | Admitting: Podiatry

## 2019-05-04 DIAGNOSIS — B351 Tinea unguium: Secondary | ICD-10-CM

## 2019-05-04 DIAGNOSIS — R0609 Other forms of dyspnea: Secondary | ICD-10-CM

## 2019-05-04 DIAGNOSIS — R0600 Dyspnea, unspecified: Secondary | ICD-10-CM | POA: Diagnosis not present

## 2019-05-04 DIAGNOSIS — E11610 Type 2 diabetes mellitus with diabetic neuropathic arthropathy: Secondary | ICD-10-CM

## 2019-05-04 DIAGNOSIS — M79676 Pain in unspecified toe(s): Secondary | ICD-10-CM

## 2019-05-04 DIAGNOSIS — E1149 Type 2 diabetes mellitus with other diabetic neurological complication: Secondary | ICD-10-CM

## 2019-05-04 LAB — PULMONARY FUNCTION TEST
DL/VA % pred: 99 %
DL/VA: 4.09 ml/min/mmHg/L
DLCO unc % pred: 85 %
DLCO unc: 16.47 ml/min/mmHg
FEF 25-75 Post: 3.09 L/sec
FEF 25-75 Pre: 2.58 L/sec
FEF2575-%Change-Post: 19 %
FEF2575-%Pred-Post: 176 %
FEF2575-%Pred-Pre: 147 %
FEV1-%Change-Post: 2 %
FEV1-%Pred-Post: 102 %
FEV1-%Pred-Pre: 99 %
FEV1-Post: 2.22 L
FEV1-Pre: 2.16 L
FEV1FVC-%Change-Post: 3 %
FEV1FVC-%Pred-Pre: 113 %
FEV6-%Change-Post: -2 %
FEV6-%Pred-Post: 89 %
FEV6-%Pred-Pre: 92 %
FEV6-Post: 2.47 L
FEV6-Pre: 2.53 L
FEV6FVC-%Change-Post: 0 %
FEV6FVC-%Pred-Post: 104 %
FEV6FVC-%Pred-Pre: 104 %
FVC-%Change-Post: 0 %
FVC-%Pred-Post: 87 %
FVC-%Pred-Pre: 87 %
FVC-Post: 2.52 L
FVC-Pre: 2.53 L
Post FEV1/FVC ratio: 88 %
Post FEV6/FVC ratio: 100 %
Pre FEV1/FVC ratio: 85 %
Pre FEV6/FVC Ratio: 100 %
RV % pred: 103 %
RV: 2.32 L
TLC % pred: 96 %
TLC: 4.9 L

## 2019-05-04 NOTE — Progress Notes (Signed)
Complaint:  Visit Type: Patient returns to my office for continued preventative foot care services. Complaint: Patient states" my nails have grown long and thick and become painful to walk and wear shoes" Patient has been diagnosed with DM with no foot complications. The patient presents for preventative foot care services. No changes to ROS.  Patient says she is developing multiple skin lesions.   Podiatric Exam: Vascular: dorsalis pedis and posterior tibial pulses are palpable bilateral. Capillary return is immediate. Temperature gradient is WNL. Skin turgor WNL  Sensorium: Absent  Semmes Weinstein monofilament test. Normal tactile sensation bilaterally. Nail Exam: Pt has thick disfigured discolored nails with subungual debris noted bilateral entire nail hallux through fifth toenails Ulcer Exam: There is no evidence of ulcer or pre-ulcerative changes or infection. Orthopedic Exam: Muscle tone and strength are WNL. No limitations in general ROM. No crepitus or effusions noted. Foot type and digits show no abnormalities. Bony prominences are unremarkable. 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  Overlapping second digit left foot. Skin: No Porokeratosis. No infection or ulcers  Diagnosis:  Onychomycosis, , Pain in right toe, pain in left toes Diabetes with neuropathy  Treatment & Plan Procedures and Treatment: Consent by patient was obtained for treatment procedures. The patient understood the discussion of treatment and procedures well. All questions were answered thoroughly reviewed. Debridement of mycotic and hypertrophic toenails, 1 through 5 bilateral and clearing of subungual debris. No ulceration, no infection noted.   Return Visit-Office Procedure: Patient instructed to return to the office for a follow up visit 3 months for continued evaluation and treatment.    Aleese Kamps DPM 

## 2019-05-04 NOTE — Progress Notes (Signed)
Full PFT performed today. °

## 2019-05-12 ENCOUNTER — Telehealth: Payer: Self-pay | Admitting: *Deleted

## 2019-05-12 NOTE — Telephone Encounter (Signed)
Copied from [place] [phone]. Topic: General - Other >> May 12, 2019 11:10 AM Keene Breath wrote: Reason for CRM: Patient called to ask the nurse to call her regarding a letter of medical necessity she needs that the doctor gave her a few months ago.  Patient has an appt. Tomorrow and she needs this letter as soon as possible.  CB# [phone]

## 2019-05-12 NOTE — Telephone Encounter (Signed)
LMTCB

## 2019-05-12 NOTE — Telephone Encounter (Signed)
Pt called at 5:04 and stated her appt is at 2 tomorrow and needs it before then.  She will come by and pick it up if needed  Best number [phone]

## 2019-05-13 ENCOUNTER — Other Ambulatory Visit: Payer: Self-pay

## 2019-05-13 ENCOUNTER — Encounter: Payer: Self-pay | Admitting: Plastic Surgery

## 2019-05-13 ENCOUNTER — Ambulatory Visit (INDEPENDENT_AMBULATORY_CARE_PROVIDER_SITE_OTHER): Payer: Medicare Other | Admitting: Plastic Surgery

## 2019-05-13 VITALS — BP 134/74 | HR 78 | Temp 97.5°F | Ht 64.5 in | Wt 252.8 lb

## 2019-05-13 DIAGNOSIS — E65 Localized adiposity: Secondary | ICD-10-CM

## 2019-05-13 NOTE — Telephone Encounter (Signed)
Spoke with pt and she stated that she can not see the letter on mychart, so pt has asked that we print it off as well as her last OV note and fax it to her personal fax machine at her home.   Printed and faxed.

## 2019-05-13 NOTE — Progress Notes (Signed)
Referring Provider [name], MD [place],  [place] [address]   CC:  Chief Complaint  Patient presents with  . Advice Only    for panniculectomy      Denise Sharp is an 73 y.o. female.  HPI: Patient is presenting to discuss panniculectomy.  She has previously seen someone at [facility] and was planning to have it done there however scheduling issues and Covid led to enough delays that she is seeking her care elsewhere.  She has had bariatric surgery done twice and at her heaviest was over 350 pounds and her lightest was around 230 pounds.  She said her weight is only fluctuated 10 to 15 pounds over the last 4 to 6 months.  She has persistent rashes and irritation in the fold beneath her pannus that causes her pain and discomfort.  She has tried creams and pads to absorb the most moisture with limited success.  She has had an open cholecystectomy and appendectomy and her two gastric bypass procedures.  She does not smoke.  She has diabetes but most recent hemoglobin A1c is 7.4.  Allergies  Allergen Reactions  . Biaxin [Clarithromycin] Nausea And Vomiting and Other (See Comments)    Severe irritable bowel  . Amoxicillin Diarrhea and Other (See Comments)    .PATIENT HAS HAD A PCN REACTION WITH IMMEDIATE RASH, FACIAL/TONGUE/THROAT SWELLING, SOB, OR LIGHTHEADEDNESS WITH HYPOTENSION:  #  #  #  YES  #  #  #   Has patient had a PCN reaction causing severe rash involving mucus membranes or skin necrosis: No Has patient had a PCN reaction that required hospitalization: No Has patient had a PCN reaction occurring within the last 10 years: #  #  #  YES  #  #  #  TOLERATED cefazolin pre-op 08/13/17  . Demeclocycline Hives  . Erythromycin Nausea And Vomiting and Other (See Comments)    Severe irritable bowel  . Flagyl [Metronidazole] Nausea And Vomiting and Other (See Comments)    Severe irritable bowel  . Glucophage [Metformin Hcl] Nausea And Vomiting and Other (See  Comments)    "Sick" "I won't take anything that has metformin in it"  . Tetracyclines & Related Hives and Rash  . Diovan [Valsartan] Nausea Only       . Sulfa Antibiotics Rash and Other (See Comments)    As child  . Xanax [Alprazolam] Other (See Comments)    UNSPECIFIED REACTION, unknown    Outpatient Encounter Medications as of 05/13/2019  Medication Sig Note  . albuterol (PROVENTIL HFA;VENTOLIN HFA) 108 (90 Base) MCG/ACT inhaler Inhale 2 puffs into the lungs every 8 (eight) hours as needed for wheezing or shortness of breath.   [name] aspirin EC 81 MG tablet Take 1 tablet (81 mg total) by mouth daily. (Patient taking differently: Take 81 mg by mouth 2 (two) times daily. )   . benzonatate (TESSALON) 200 MG capsule Take 1 capsule (200 mg total) by mouth 2 (two) times daily as needed for cough.   . Blood Glucose Monitoring Suppl (ONETOUCH VERIO) w/Device KIT 1 Device by Does not apply route daily. Use to test blood glucose once daily. Dx code = E11.9   . buPROPion (WELLBUTRIN) 75 MG tablet TAKE 1 TABLET BY MOUTH TWICE A DAY   . butalbital-acetaminophen-caffeine (FIORICET) 50-325-40 MG tablet Take 1 tablet by mouth every 6 (six) hours as needed for headache.   . cholestyramine light (PREVALITE) 4 g packet Take by mouth.   [name]  citalopram (CELEXA) 10 MG tablet Take 1 tablet (10 mg total) by mouth daily.   . clotrimazole-betamethasone (LOTRISONE) cream Apply 1 application topically 2 (two) times daily.   . cyanocobalamin (,VITAMIN B-12,) 1000 MCG/ML injection Inject 1 ml (1000 mcg ) IM weekly x 4,  Then monthly thereafter   . diazepam (VALIUM) 5 MG tablet Take 1 tablet (5 mg total) by mouth at bedtime as needed for anxiety.   . dicyclomine (BENTYL) 10 MG capsule Take 1 capsule (10 mg total) by mouth 4 (four) times daily -  before meals and at bedtime.   . Eszopiclone 3 MG TABS Take 1 tablet (3 mg total) by mouth at bedtime.   . Insulin Glargine (LANTUS SOLOSTAR) 100 UNIT/ML Solostar Pen Inject 11 Units  into the skin daily.   . Insulin Pen Needle 31G X 5 MM MISC Use to inject insulin daily   . Lancet Devices (ONE TOUCH DELICA LANCING DEV) MISC Use to test blood glucose once daily. Dx code = E11.9   . levETIRAcetam (KEPPRA) 750 MG tablet Take 1 tablet (750 mg total) by mouth at bedtime.   . NONFORMULARY OR COMPOUNDED [place]  Combination Pain Cream -  Baclofen 2%, Doxepin 5%, Gabapentin 6%, Topiramate 2%, Pentoxifylline 3% Apply 1-2 grams to affected area 3-4 times daily Qty. 120 gm 3 refills   . nystatin [facility]) powder Apply topically 4 (four) times daily.   . ondansetron (ZOFRAN) 4 MG tablet Take 1 tablet (4 mg total) by mouth every 8 (eight) hours as needed for nausea or vomiting.   Glory Rosebush DELICA LANCETS FINE MISC Use to test blood sguar once daily. Dx code = E11.9   . ONETOUCH VERIO test strip USE TO TEST BLOOD SUGAR ONCE DAILY. DX CODE = E11.9   . pregabalin (LYRICA) 100 MG capsule Take 100 mg by mouth daily. 12/16/2017: Only takes if Keppra does not help for neuropathic pain  . rosuvastatin (CRESTOR) 10 MG tablet Take 1 tablet (10 mg total) by mouth daily.   . Syringe/Needle, Disp, (SYRINGE 3CC/25GX1") 25G X 1" 3 ML MISC Use for b12 injections   . traMADol (ULTRAM) 50 MG tablet Take 0.5 tablets (25 mg total) by mouth every 12 (twelve) hours as needed for severe pain.   [name] umeclidinium-vilanterol (ANORO ELLIPTA) 62.5-25 MCG/INH AEPB Inhale 1 puff into the lungs daily.   . [DISCONTINUED] citalopram (CELEXA) 10 MG tablet Take 1 tablet (10 mg total) by mouth daily.    No facility-administered encounter medications on file as of 05/13/2019.      Past Medical History:  Diagnosis Date  . Abnormality of gait 03/25/2013  . Adrenal mass, left ([place])   . Anemia    iron deficient post  2 unit txfsn 2009, normal endo/colonoscopy by [facility]  . Arthritis   . Atypical chest pain    a. 04/1986 Cath ([facility]): nl cors, EF 65%; b. 07/2012 Cath [facility]): LM nl, LAD/LCX/RCA w/ min irregs; c.  07/2016 [facility]): "Equivocal" w/ small inf wall rev defect and hyperdynamic LV contraction, EF 87%; d. 08/2016 Cardiac CT Ca2+ score [facility]): Ca2+ score 1548.  [name] Cervical spinal stenosis 1994   due to trauma to back ([employer] accident), has intermittent paralysis and parasthesias  . Cervicogenic headache 03/23/2014  . Depression   . Dizziness    chronic dizziness  . DJD (degenerative joint disease)    a. Chronic R shoulder pain pending R shoulder replacement 07/2017.  [name] Esophageal stenosis March 2011   with  transietn outlet obstruction by food, cleared by EGD   . Family history of adverse reaction to anesthesia    daughter PONV  . Gastric bypass status for obesity   . Headache(784.0)   . Hypertension   . IBS (irritable bowel syndrome)   . Left bundle branch block (LBBB)    a. Intermittently present - likely rate related. - patient denies  . Obesity   . Obstructive sleep apnea    using CPAP  . 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 Restless leg syndrome   . Rotator cuff arthropathy, right 08/13/2017  . Syncope and collapse 03/12/2014  . Type II diabetes mellitus ([place])     Past Surgical History:  Procedure Laterality Date  . ABDOMINAL HYSTERECTOMY    . APPENDECTOMY    . COLONOSCOPY    . DIAPHRAGMATIC HERNIA REPAIR  2015  . ESOPHAGEAL DILATION     multiple  . ESOPHAGOGASTRODUODENOSCOPY    . [place]  . GALLBLADDER SURGERY  resection  . GASTRIC BYPASS    . GASTRIC BYPASS  2000, 2005   Dr. Debroah Loop  . [facility] IMPLANT PLACEMENT  April 2013   [place]  . JOINT REPLACEMENT  2007   bilateral knee. [name],  [name]  . REVERSE SHOULDER ARTHROPLASTY Right 08/13/2017   Procedure: REVERSE RIGHT SHOULDER ARTHROPLASTY;  Surgeon: [name], MD;  Location: [place];  Service: Orthopedics;  Laterality: Right;  . ROTATOR CUFF REPAIR     right  . [place]  . TOTAL ABDOMINAL HYSTERECTOMY W/ BILATERAL SALPINGOOPHORECTOMY  1974  . UMBILICAL HERNIA  REPAIR  Aug 11, 2015    Family History  Problem Relation Age of Onset  . Heart disease Father   . Hypertension Father   . Prostate cancer Father   . Stroke Father   . Osteoporosis Father   . Stroke Mother   . Depression Mother   . Headache Mother   . Heart disease Mother   . Thyroid disease Mother   . Hypertension Mother   . Diabetes Daughter   . Heart disease Daughter   . Hypertension Daughter   . Hypertension Son     Social History   Social History Narrative   Patient lives at home with husband [name].    Patient has 2 children.    Patient has [employer].    Patient is on disability.    Patient is right handed.      Review of Systems General: Denies fevers, chills, weight loss CV: Denies chest pain, shortness of breath, palpitations  Physical Exam Vitals with BMI 05/13/2019 03/06/2019 02/19/2019  Height 5' 4.5" 5' 4.5" 5' 4.5"  Weight 252 lbs 13 oz 247 lbs 10 oz 250 lbs 3 oz  BMI 42.74 61.44 31.5  Systolic 400 867 619  Diastolic 74 80 80  Pulse 78 85 78    General:  No acute distress,  Alert and oriented, Non-Toxic, Normal speech and affect Abdomen: She has large protuberant abdomen with significant overhanging pannus.  She has obvious irritation in them fold beneath the pannus.  She has a large right subcostal scar.  She has an upper midline scar.  She has scattered scars from laparoscopic procedures.  I am not able to palpate any hernias.  Imaging: CT scan of the abdomen from 3 years ago was reviewed.  There were no hernias at that time.  Assessment/Plan Patient 73 year old female with multiple medical comorbidities presenting with a symptomatic overhanging pannus.  She  has irritation in the skin fold affecting her quality of life.  She is aware that her diabetes, BMI, and other medical comorbidities place her at a higher risk for this procedure.  She claims that her prior doctor they were going to perform a complete abdominoplasty on her.  My opinion she is  not a candidate for that procedure.  I am only willing to perform an infraumbilical panniculectomy with no undermining to limit complications.  She is aware of the complications include bleeding, infection, damage to surrounding structures, thigh numbness, wound healing complications, need for reoperation.  She is aware the risk of blood clots.  She wants to move forward so we will submit the documentation for her approval process.  [name] 05/13/2019, 3:38 PM

## 2019-05-21 ENCOUNTER — Telehealth: Payer: Self-pay

## 2019-05-21 NOTE — Telephone Encounter (Signed)
Copied from [place] [phone]. Topic: General - Other >> May 21, 2019 12:07 PM Celene Kras A wrote: Reason for CRM: Pt called and is needing a pre op physical before her surgery on 06/16/2019. Please advise as PCP does not have in office appt until 07/13/2019. Please advise.

## 2019-05-21 NOTE — Telephone Encounter (Signed)
YOU CAN USE 11:30  ON Thursday NOV12

## 2019-05-21 NOTE — Telephone Encounter (Signed)
Copied from [place] [phone]. Topic: General - Other >> May 21, 2019  5:05 PM Erick Blinks wrote: Reason for CRM: Angie from [facility] called faxing over a surgical clearance form.

## 2019-05-22 ENCOUNTER — Telehealth: Payer: Self-pay | Admitting: Internal Medicine

## 2019-05-22 NOTE — Telephone Encounter (Signed)
appt noted surgery clearance. I will route to Dr. Saunders Revel for appt 11/11 and to the surgeon Dr. Mingo Amber. I will remove from the pre op call back pool.

## 2019-05-22 NOTE — Telephone Encounter (Signed)
   [facility] Pre-operative Risk Assessment    Request for surgical clearance:  1. What type of surgery is being performed? PANNICULECTOMY  2. When is this surgery scheduled? 06/16/19  3. What type of clearance is required (medical clearance vs. Pharmacy clearance to hold med vs. Both)? BOTH  4. Are there any medications that need to be held prior to surgery and how long? NOT LISTED  5. Practice name and name of physician performing surgery? [facility]   6. What is your office phone number [phone]   7.   What is your office fax number [phone]  8.   Anesthesia type (None, local, MAC, general) ? GENERAL  Lucienne Minks 05/22/2019, 11:37 AM  _________________________________________________________________   (provider comments below)

## 2019-05-22 NOTE — Telephone Encounter (Signed)
   Primary Cardiologist:Christopher End, MD  Chart reviewed as part of pre-operative protocol coverage. Because of Denise Sharp's past medical history and time since last visit, he/she will require a follow-up visit in order to better assess preoperative cardiovascular risk.  Pt already has a scheduled appointment on 05/27/19 with Dr. Saunders Revel. Planned surgery is for panniculectomy on 06/16/2019.   Pre-op covering staff: - Please make sure that pre-op is added to comments for the appointment and that the provider is aware.  - Please contact requesting surgeon's office via preferred method (i.e, phone, fax) to inform them of need for appointment prior to surgery.  If applicable, this message will also be routed to pharmacy pool and/or primary cardiologist for input on holding anticoagulant/antiplatelet agent as requested below so that this information is available at time of patient's appointment.   [name], NP  05/22/2019, 11:52 AM

## 2019-05-22 NOTE — Telephone Encounter (Signed)
Left message for patient to to return cal to office. Please schedule 05/28/19 @ 11:30 PEC can schedule if not transfer to front desk not triage please.

## 2019-05-25 ENCOUNTER — Telehealth: Payer: Self-pay

## 2019-05-25 NOTE — Telephone Encounter (Signed)
Copied from [place] [phone]. Topic: General - Inquiry >> May 25, 2019 12:09 PM Mathis Bud wrote: Reason for CRM: patient is returning "[facility]" call. Patient would like to be called back on house phone [phone]

## 2019-05-25 NOTE — Telephone Encounter (Signed)
Patient has been scheduled

## 2019-05-27 ENCOUNTER — Telehealth: Payer: Self-pay | Admitting: *Deleted

## 2019-05-27 ENCOUNTER — Ambulatory Visit (INDEPENDENT_AMBULATORY_CARE_PROVIDER_SITE_OTHER): Payer: Medicare Other | Admitting: Internal Medicine

## 2019-05-27 ENCOUNTER — Encounter: Payer: Self-pay | Admitting: Internal Medicine

## 2019-05-27 ENCOUNTER — Other Ambulatory Visit: Payer: Self-pay

## 2019-05-27 VITALS — BP 112/78 | HR 61 | Ht 64.5 in | Wt 251.2 lb

## 2019-05-27 DIAGNOSIS — I10 Essential (primary) hypertension: Secondary | ICD-10-CM | POA: Diagnosis not present

## 2019-05-27 DIAGNOSIS — R0609 Other forms of dyspnea: Secondary | ICD-10-CM

## 2019-05-27 DIAGNOSIS — R0600 Dyspnea, unspecified: Secondary | ICD-10-CM | POA: Diagnosis not present

## 2019-05-27 DIAGNOSIS — Z01810 Encounter for preprocedural cardiovascular examination: Secondary | ICD-10-CM | POA: Diagnosis not present

## 2019-05-27 NOTE — Progress Notes (Signed)
Follow-up Outpatient Visit Date: 05/27/2019  Primary Care Provider: [name], MD [address] [place] [address]  Chief Complaint: Shortness of breath  HPI:  [name] is a 73 y.o. 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  She was last seen in our office for preop evaluation in 06/2017 by Ignacia Bayley, NP.  At that time, she was chest pain-free but reported chronic exertional dyspnea.  Today, [name] reports continued dyspnea with exertion.  It is present with modest activity and has worsened over the last 6 months.  [name] attributes this to weight gain and deconditioning.  She recently underwent PFT's with Dr. Annamaria Boots for further assessment of her dyspnea; these were normal.  She denies chest pain, palpitations, lightheadedness, and edema.  [name] is planning to undergo panniculectomy next month with Dr. Claudia Desanctis.  She is unable to climb two flights of stairs due to a combination of shortness of breath and knee pain.  --------------------------------------------------------------------------------------------------  Cardiovascular History & Procedures: Cardiovascular Problems:  Intermittent left bundle branch block  Chronic atypical chest pain and shortness of breath  Risk Factors:  Hypertension, hyperlipidemia, diabetes mellitus, obesity, sedentary lifestyle, and age greater than 43  Cath/PCI:  LHC (07/24/12, [facility]): LMCA normal. LAD, LCx, and RCA with minor luminal irregularities.  LHC (04/15/86, [facility]): Normal LMCA, LAD, LCx, and RCA. LVEF 65%.  CV  Surgery:  None  EP Procedures and Devices:  None  Non-Invasive Evaluation(s):  Cardiac CT calcium score (09/03/16, Dr. [facility]rey Rolls): Coronary calcium score 1548.  Pharmacologic myocardial perfusion stress test (08/08/16, Dr. [facility]rey Rolls): "Equivocal stress test" with small inferior wall reversible defect and hyperdynamic LV contraction (EF 87%).  Transthoracic echocardiogram (08/06/16, Dr. Humphrey Rolls): Technically difficult study with mildly dilated left ventricle. LVEF was normal with normal wall motion. Grade 1 diastolic dysfunction. Mildly dilated RV with normal contraction. Mitral annular calcification present with mild MR. Mild PR and TR also present. Normal pulmonary artery pressure. Severe left and mild right atrial enlargement.  Pharmacologic myocardial perfusion stress test (08/11/15, [facility]): Normal perfusion without ischemia or scar. LVEF 81%.  Recent CV Pertinent Labs: Lab Results  Component Value Date   CHOL 193 02/24/2018   CHOL 155 02/27/2014   HDL 51.40 02/24/2018   HDL 43 02/27/2014   LDLCALC 102 (H) 02/24/2018   LDLCALC 88 02/27/2014   LDLDIRECT 111.0 06/16/2015   TRIG 197.0 (H) 02/24/2018   TRIG 121 02/27/2014   CHOLHDL 4 02/24/2018   K 4.0 03/06/2019   K 4.0 06/21/2014   MG 1.5 (L) 10/22/2018   MG 1.5 (L) 08/05/2012   BUN 12 03/06/2019   BUN 11 10/22/2018   BUN 12 06/21/2014   CREATININE 0.88 03/06/2019    Past medical and surgical history were reviewed and updated in EPIC.  Current Meds  Medication Sig  . albuterol (PROVENTIL HFA;VENTOLIN HFA) 108 (90 Base) MCG/ACT inhaler Inhale 2 puffs into the lungs every 8 (eight) hours as needed for wheezing or shortness of breath.  [name] amoxicillin (AMOXIL) 500 MG capsule Take 500 mg by mouth 2 (two) times daily.  [name] aspirin EC 81 MG tablet Take 1 tablet (81 mg total) by mouth daily. (Patient taking differently: Take 81 mg by mouth  2 (two) times daily. )  . benzonatate (TESSALON) 200 MG capsule Take 1 capsule (200 mg total) by mouth  2 (two) times daily as needed for cough.  . Blood Glucose Monitoring Suppl (ONETOUCH VERIO) w/Device KIT 1 Device by Does not apply route daily. Use to test blood glucose once daily. Dx code = E11.9  . buPROPion (WELLBUTRIN) 75 MG tablet TAKE 1 TABLET BY MOUTH TWICE A DAY  . cholestyramine light (PREVALITE) 4 g packet Take by mouth.  . citalopram (CELEXA) 10 MG tablet Take 1 tablet (10 mg total) by mouth daily.  . clotrimazole-betamethasone (LOTRISONE) cream Apply 1 application topically 2 (two) times daily.  . cyanocobalamin (,VITAMIN B-12,) 1000 MCG/ML injection Inject 1 ml (1000 mcg ) IM weekly x 4,  Then monthly thereafter  . diazepam (VALIUM) 5 MG tablet Take 1 tablet (5 mg total) by mouth at bedtime as needed for anxiety.  . dicyclomine (BENTYL) 10 MG capsule Take 1 capsule (10 mg total) by mouth 4 (four) times daily -  before meals and at bedtime.  . Eszopiclone 3 MG TABS Take 1 tablet (3 mg total) by mouth at bedtime.  . Insulin Glargine (LANTUS SOLOSTAR) 100 UNIT/ML Solostar Pen Inject 11 Units into the skin daily. (Patient taking differently: Inject 11-13 Units into the skin daily. )  . Insulin Pen Needle 31G X 5 MM MISC Use to inject insulin daily  . Lancet Devices (ONE TOUCH DELICA LANCING DEV) MISC Use to test blood glucose once daily. Dx code = E11.9  . levETIRAcetam (KEPPRA) 750 MG tablet Take 1 tablet (750 mg total) by mouth at bedtime. (Patient taking differently: Take 750 mg by mouth daily as needed. )  . NONFORMULARY OR COMPOUNDED [place]  Combination Pain Cream -  Baclofen 2%, Doxepin 5%, Gabapentin 6%, Topiramate 2%, Pentoxifylline 3% Apply 1-2 grams to affected area 3-4 times daily Qty. 120 gm 3 refills  . nystatin [facility]) powder Apply topically 4 (four) times daily.  . ondansetron (ZOFRAN) 4 MG tablet Take 1 tablet (4 mg total) by mouth every 8 (eight) hours as needed for nausea or vomiting.  Glory Rosebush DELICA LANCETS FINE MISC Use to test blood sguar once  daily. Dx code = E11.9  . ONETOUCH VERIO test strip USE TO TEST BLOOD SUGAR ONCE DAILY. DX CODE = E11.9  . pregabalin (LYRICA) 100 MG capsule Take 100 mg by mouth daily.  . rosuvastatin (CRESTOR) 10 MG tablet Take 1 tablet (10 mg total) by mouth daily.  . Syringe/Needle, Disp, (SYRINGE 3CC/25GX1") 25G X 1" 3 ML MISC Use for b12 injections  . traMADol (ULTRAM) 50 MG tablet Take 0.5 tablets (25 mg total) by mouth every 12 (twelve) hours as needed for severe pain.  [name] umeclidinium-vilanterol (ANORO ELLIPTA) 62.5-25 MCG/INH AEPB Inhale 1 puff into the lungs daily.  . [DISCONTINUED] butalbital-acetaminophen-caffeine (FIORICET) 50-325-40 MG tablet Take 1 tablet by mouth every 6 (six) hours as needed for headache.    Allergies: Biaxin [clarithromycin], Amoxicillin, Demeclocycline, Erythromycin, Flagyl [metronidazole], Glucophage [metformin hcl], Tetracyclines & related, Diovan [valsartan], Sulfa antibiotics, and Xanax [alprazolam]  Social History   Tobacco Use  . Smoking status: Never Smoker  . Smokeless tobacco: Never Used  Substance Use Topics  . Alcohol use: No  . Drug use: No    Family History  Problem Relation Age of Onset  . Heart disease Father   . Hypertension Father   . Prostate cancer Father   . Stroke Father   . Osteoporosis Father   .  Stroke Mother   . Depression Mother   . Headache Mother   . Heart disease Mother   . Thyroid disease Mother   . Hypertension Mother   . Diabetes Daughter   . Heart disease Daughter   . Hypertension Daughter   . Hypertension Son     Review of Systems: A 12-system review of systems was performed and was negative except as noted in the HPI.  --------------------------------------------------------------------------------------------------  Physical Exam: BP 112/78 (BP Location: Left Arm, Patient Position: Sitting, Cuff Size: Large)   Pulse 61   Ht 5' 4.5" (1.638 m)   Wt 251 lb 4 oz (114 kg)   SpO2 98%   BMI 42.46 kg/m   General:   NAD. HEENT: No conjunctival pallor or scleral icterus. Facemask in place. Neck: Supple without lymphadenopathy, thyromegaly, JVD, or HJR. Lungs: Normal work of breathing. Clear to auscultation bilaterally without wheezes or crackles. Heart: Regular rate and rhythm without murmurs, rubs, or gallops. Abd: Bowel sounds present. Soft, NT/ND. Ext: No lower extremity edema. Radial, PT, and DP pulses are 2+ bilaterally. Skin: Warm and dry without rash.  EKG:  NSR with sinus arrhythmia.  No significant abnormality.  Lab Results  Component Value Date   WBC 8.3 01/01/2019   HGB 12.1 01/01/2019   HCT 37.7 01/01/2019   MCV 88.2 01/01/2019   PLT 295.0 01/01/2019    Lab Results  Component Value Date   NA 141 03/06/2019   K 4.0 03/06/2019   CL 103 03/06/2019   CO2 23 03/06/2019   BUN 12 03/06/2019   CREATININE 0.88 03/06/2019   GLUCOSE 122 (H) 03/06/2019   ALT 9 03/06/2019    Lab Results  Component Value Date   CHOL 193 02/24/2018   HDL 51.40 02/24/2018   LDLCALC 102 (H) 02/24/2018   LDLDIRECT 111.0 06/16/2015   TRIG 197.0 (H) 02/24/2018   CHOLHDL 4 02/24/2018    --------------------------------------------------------------------------------------------------  ASSESSMENT AND PLAN: Dyspnea on exertion: This is likely multifactorial, including morbid obesity and deconditioning.  However, [name] has multiple cardiac risk factors.  We have agreed to proceed with a pharmacologic myocardial perfusion stress test to exclude underlying ischemia.  Hypertension: Blood pressure well-controlled.  Continue current medications.  Morbid obesity: I encouraged [name] to continue to work on weight loss through diet and exercise.  Preop clearance: [name] reports worsening exertional dyspnea over the last 6 months.  While this is most likely related to deconditioning and weight gain, I have recommended a pharmacologic myocardial perfusion stress test to exclude ischemia with  dyspnea as an anginal equivalent.  If there are no high risk findings on the stress test, I think it would be reasonable for [name] to proceed with panniculectomy as planned.  Follow-up: Return to clinic in 6 months.  [name], MD 05/28/2019 1:47 PM

## 2019-05-27 NOTE — Telephone Encounter (Signed)
Form placed in Dr Demetrios Isaacs yellow folder

## 2019-05-27 NOTE — Patient Instructions (Addendum)
Medication Instructions:  Your physician recommends that you continue on your current medications as directed. Please refer to the Current Medication list given to you today.  *If you need a refill on your cardiac medications before your next appointment, please call your pharmacy*  Lab Work: none If you have labs (blood work) drawn today and your tests are completely normal, you will receive your results only by: [name] MyChart Message (if you have MyChart) OR . A paper copy in the mail If you have any lab test that is abnormal or we need to change your treatment, we will call you to review the results.  Testing/Procedures: Your physician has requested that you have a lexiscan myoview. For further information please visit [email or web address]. Please follow instruction sheet, as given.  [place]  Your caregiver has ordered a Stress Test with nuclear imaging. The purpose of this test is to evaluate the blood supply to your heart muscle. This procedure is referred to as a "Non-Invasive Stress Test." This is because other than having an IV started in your vein, nothing is inserted or "invades" your body. Cardiac stress tests are done to find areas of poor blood flow to the heart by determining the extent of coronary artery disease (CAD). Some patients exercise on a treadmill, which naturally increases the blood flow to your heart, while others who are  unable to walk on a treadmill due to physical limitations have a pharmacologic/chemical stress agent called Lexiscan . This medicine will mimic walking on a treadmill by temporarily increasing your coronary blood flow.   Please note: these test may take anywhere between 2-4 hours to complete  PLEASE REPORT TO [place] AT THE FIRST DESK WILL DIRECT YOU WHERE TO GO  Date of Procedure:_____________________________________  Arrival Time for Procedure:______________________________  Instructions regarding  medication:   _XX__ : Hold diabetes medication morning of procedure - Take 1/2 dose of evening insulin the night before the procedure. Do not take any morning insulin or diabetes medications.    PLEASE NOTIFY THE OFFICE AT LEAST 54 HOURS IN ADVANCE IF YOU ARE UNABLE TO KEEP YOUR APPOINTMENT.  [phone] AND  PLEASE NOTIFY NUCLEAR MEDICINE AT [facility] AT LEAST 24 HOURS IN ADVANCE IF YOU ARE UNABLE TO KEEP YOUR APPOINTMENT. [phone]  How to prepare for your Myoview test:  1. Do not eat or drink after midnight 2. No caffeine for 24 hours prior to test 3. No smoking 24 hours prior to test. 4. Your medication may be taken with water.  If your doctor stopped a medication because of this test, do not take that medication. 5. Ladies, please do not wear dresses.  Skirts or pants are appropriate. Please wear a short sleeve shirt. 6. No perfume, cologne or lotion. 7. Wear comfortable walking shoes. No heels!  Follow-Up: At [facility], you and your health needs are our priority.  As part of our continuing mission to provide you with exceptional heart care, we have created designated Provider Care Teams.  These Care Teams include your primary Cardiologist (physician) and Advanced Practice Providers (APPs -  Physician Assistants and Nurse Practitioners) who all work together to provide you with the care you need, when you need it.  Your next appointment:   6 months  The format for your next appointment:   In Person  Provider:    You may see [name], MD or one of the following Advanced Practice Providers on your designated Care Team:  Murray Hodgkins, NP  Christell Faith, PA-C  Marrianne Mood, PA-C   Cardiac Nuclear Scan A cardiac nuclear scan is a test that measures blood flow to the heart when a person is resting and when he or she is exercising. The test looks for problems such as:  Not enough blood reaching a portion of the heart.  The heart muscle not working  normally. You may need this test if:  You have heart disease.  You have had abnormal lab results.  You have had heart surgery or a balloon procedure to open up blocked arteries (angioplasty).  You have chest pain.  You have shortness of breath. In this test, a radioactive dye (tracer) is injected into your bloodstream. After the tracer has traveled to your heart, an imaging device is used to measure how much of the tracer is absorbed by or distributed to various areas of your heart. This procedure is usually done at a hospital and takes 2-4 hours. Tell a health care provider about:  Any allergies you have.  All medicines you are taking, including vitamins, herbs, eye drops, creams, and over-the-counter medicines.  Any problems you or family members have had with anesthetic medicines.  Any blood disorders you have.  Any surgeries you have had.  Any medical conditions you have.  Whether you are pregnant or may be pregnant. What are the risks? Generally, this is a safe procedure. However, problems may occur, including:  Serious chest pain and heart attack. This is only a risk if the stress portion of the test is done.  Rapid heartbeat.  Sensation of warmth in your chest. This usually passes quickly.  Allergic reaction to the tracer. What happens before the procedure?  Ask your health care provider about changing or stopping your regular medicines. This is especially important if you are taking diabetes medicines or blood thinners.  Follow instructions from your health care provider about eating or drinking restrictions.  Remove your jewelry on the day of the procedure. What happens during the procedure?  An IV will be inserted into one of your veins.  Your health care provider will inject a small amount of radioactive tracer through the IV.  You will wait for 20-40 minutes while the tracer travels through your bloodstream.  Your heart activity will be monitored with  an electrocardiogram (ECG).  You will lie down on an exam table.  Images of your heart will be taken for about 15-20 minutes.  You may also have a stress test. For this test, one of the following may be done: ? You will exercise on a treadmill or stationary bike. While you exercise, your heart's activity will be monitored with an ECG, and your blood pressure will be checked. ? You will be given medicines that will increase blood flow to parts of your heart. This is done if you are unable to exercise.  When blood flow to your heart has peaked, a tracer will again be injected through the IV.  After 20-40 minutes, you will get back on the exam table and have more images taken of your heart.  Depending on the type of tracer used, scans may need to be repeated 3-4 hours later.  Your IV line will be removed when the procedure is over. The procedure may vary among health care providers and hospitals. What happens after the procedure?  Unless your health care provider tells you otherwise, you may return to your normal schedule, including diet, activities, and medicines.  Unless your health  care provider tells you otherwise, you may increase your fluid intake. This will help to flush the contrast dye from your body. Drink enough fluid to keep your urine pale yellow.  Ask your health care provider, or the department that is doing the test: ? When will my results be ready? ? How will I get my results? Summary  A cardiac nuclear scan measures the blood flow to the heart when a person is resting and when he or she is exercising.  Tell your health care provider if you are pregnant.  Before the procedure, ask your health care provider about changing or stopping your regular medicines. This is especially important if you are taking diabetes medicines or blood thinners.  After the procedure, unless your health care provider tells you otherwise, increase your fluid intake. This will help flush the  contrast dye from your body.  After the procedure, unless your health care provider tells you otherwise, you may return to your normal schedule, including diet, activities, and medicines. This information is not intended to replace advice given to you by your health care provider. Make sure you discuss any questions you have with your health care provider. Document Released: 07/27/2004 Document Revised: 12/16/2017 Document Reviewed: 12/16/2017 Elsevier Patient Education  2020 [employer].

## 2019-05-27 NOTE — Telephone Encounter (Signed)
Faxed Request for Surgical Clearance to Dr. Uvaldo Bristle- (PCP) on (05/21/19),and to Dr. Harrell Gave End-(Cardiologist) on (05/22/19).  Patient is scheduled for a Panniculectomy on 06/16/19 at [facility].  Confirmations received.//AB/CMA

## 2019-05-28 ENCOUNTER — Encounter: Payer: Self-pay | Admitting: Internal Medicine

## 2019-05-28 ENCOUNTER — Other Ambulatory Visit: Payer: Self-pay

## 2019-05-28 ENCOUNTER — Ambulatory Visit (INDEPENDENT_AMBULATORY_CARE_PROVIDER_SITE_OTHER): Payer: Medicare Other | Admitting: Internal Medicine

## 2019-05-28 VITALS — BP 110/70 | HR 70 | Temp 96.8°F | Ht 64.0 in | Wt 251.8 lb

## 2019-05-28 DIAGNOSIS — Z01818 Encounter for other preprocedural examination: Secondary | ICD-10-CM

## 2019-05-28 DIAGNOSIS — E1142 Type 2 diabetes mellitus with diabetic polyneuropathy: Secondary | ICD-10-CM

## 2019-05-28 DIAGNOSIS — F330 Major depressive disorder, recurrent, mild: Secondary | ICD-10-CM

## 2019-05-28 MED ORDER — BUPROPION HCL ER (XL) 150 MG PO TB24: 150.0000 mg | ORAL_TABLET | Freq: Every day | ORAL | 1 refills | Status: DC

## 2019-05-28 MED ORDER — BUTALBITAL-APAP-CAFFEINE 50-325-40 MG PO TABS: 1.0000 | ORAL_TABLET | Freq: Four times a day (QID) | ORAL | 5 refills | Status: DC | PRN

## 2019-05-28 MED ORDER — BUTALBITAL-APAP-CAFFEINE 50-325-40 MG PO TABS: 1.0000 | ORAL_TABLET | Freq: Four times a day (QID) | ORAL | 0 refills | Status: DC | PRN

## 2019-05-28 NOTE — Progress Notes (Signed)
Subjective:  Patient ID: Denise Sharp, female    DOB: 1945/10/23  Age: 73 y.o. MRN: [id number]  CC: The primary encounter diagnosis was DM type 2 with diabetic peripheral neuropathy ([place]). Diagnoses of Morbid obesity ([place]), Preoperative clearance, and Mild episode of recurrent major depressive disorder ([place]) were also pertinent to this visit.  HPI Denise Sharp presents for preoperative medical clearance for a planned panniculectomy scheduled for Jun 16 2019 at [facility]   She has had been seen by cardiology for cardiac clearance and a stress test has been ordered by Dr. Saunders Revel  To be done on Nov 18   She has not had any recent COVID exposures   She has type 2 DM with normal renal function , manaaged with basal insulin (Lantus)  using 11 to 15 units  daily Last a1c was 7.4 in August .  Currently her blood sugars are  Elevated at 170 fasting ,   But the range of fasting is 114  To 170 .  She has not been checking any post prandial   She feels she is depressed due to the current events which have delayed her surgery.  She has gained 25 lbs since March,  And is short of breath with minimal activity.  She cannot dress herslef or put her shoes on without assistance but has been advised by her surgeon that going to rehab at a  Nursing home after her procedure is NOT an option due to increased of infection.   She plans to get assistance from Her daughter in meal planning since she has lost a significant amunt of weight recently .  Outpatient Medications Prior to Visit  Medication Sig Dispense Refill  . albuterol (PROVENTIL HFA;VENTOLIN HFA) 108 (90 Base) MCG/ACT inhaler Inhale 2 puffs into the lungs every 8 (eight) hours as needed for wheezing or shortness of breath. 1 Inhaler 0  . amoxicillin (AMOXIL) 500 MG capsule Take 500 mg by mouth 2 (two) times daily.    [name] aspirin EC 81 MG tablet Take 1 tablet (81 mg total) by mouth daily. (Patient taking differently: Take 81 mg by mouth 2  (two) times daily. ) 90 tablet 3  . benzonatate (TESSALON) 200 MG capsule Take 1 capsule (200 mg total) by mouth 2 (two) times daily as needed for cough. 30 capsule 0  . Blood Glucose Monitoring Suppl (ONETOUCH VERIO) w/Device KIT 1 Device by Does not apply route daily. Use to test blood glucose once daily. Dx code = E11.9 1 kit 0  . buPROPion (WELLBUTRIN) 75 MG tablet TAKE 1 TABLET BY MOUTH TWICE A DAY 180 tablet 1  . cholestyramine light (PREVALITE) 4 g packet Take by mouth.    . citalopram (CELEXA) 10 MG tablet Take 1 tablet (10 mg total) by mouth daily. 90 tablet 3  . clotrimazole-betamethasone (LOTRISONE) cream Apply 1 application topically 2 (two) times daily. 45 g 3  . cyanocobalamin (,VITAMIN B-12,) 1000 MCG/ML injection Inject 1 ml (1000 mcg ) IM weekly x 4,  Then monthly thereafter 110 mL 0  . diazepam (VALIUM) 5 MG tablet Take 1 tablet (5 mg total) by mouth at bedtime as needed for anxiety. 30 tablet 5  . dicyclomine (BENTYL) 10 MG capsule Take 1 capsule (10 mg total) by mouth 4 (four) times daily -  before meals and at bedtime. 360 capsule 3  . Eszopiclone 3 MG TABS Take 1 tablet (3 mg total) by mouth at bedtime. 90 tablet 1  .  ibuprofen (ADVIL) 600 MG tablet Take 600 mg by mouth every 6 (six) hours as needed.    . Insulin Glargine (LANTUS SOLOSTAR) 100 UNIT/ML Solostar Pen Inject 11 Units into the skin daily. (Patient taking differently: Inject 11-13 Units into the skin daily. ) 5 pen 3  . Insulin Pen Needle 31G X 5 MM MISC Use to inject insulin daily 30 each 1  . Lancet Devices (ONE TOUCH DELICA LANCING DEV) MISC Use to test blood glucose once daily. Dx code = E11.9 1 each 0  . levETIRAcetam (KEPPRA) 750 MG tablet Take 1 tablet (750 mg total) by mouth at bedtime. (Patient taking differently: Take 750 mg by mouth daily as needed. ) 90 tablet 3  . NONFORMULARY OR COMPOUNDED [place]  Combination Pain Cream -  Baclofen 2%, Doxepin 5%, Gabapentin 6%, Topiramate 2%,  Pentoxifylline 3% Apply 1-2 grams to affected area 3-4 times daily Qty. 120 gm 3 refills 1 each 3  . nystatin (NYAMYC) powder Apply topically 4 (four) times daily.    . ondansetron (ZOFRAN) 4 MG tablet Take 1 tablet (4 mg total) by mouth every 8 (eight) hours as needed for nausea or vomiting. 20 tablet 0  . ONETOUCH DELICA LANCETS FINE MISC Use to test blood sguar once daily. Dx code = E11.9 100 each 3  . ONETOUCH VERIO test strip USE TO TEST BLOOD SUGAR ONCE DAILY. DX CODE = E11.9 100 strip 12  . pregabalin (LYRICA) 100 MG capsule Take 100 mg by mouth daily.    . rosuvastatin (CRESTOR) 10 MG tablet Take 1 tablet (10 mg total) by mouth daily. 90 tablet 3  . Syringe/Needle, Disp, (SYRINGE 3CC/25GX1") 25G X 1" 3 ML MISC Use for b12 injections 50 each 0  . traMADol (ULTRAM) 50 MG tablet Take 0.5 tablets (25 mg total) by mouth every 12 (twelve) hours as needed for severe pain. 30 tablet 0  . umeclidinium-vilanterol (ANORO ELLIPTA) 62.5-25 MCG/INH AEPB Inhale 1 puff into the lungs daily. 1 each 12  . butalbital-acetaminophen-caffeine (FIORICET) 50-325-40 MG tablet Take 1 tablet by mouth every 6 (six) hours as needed for headache. 180 tablet 5   No facility-administered medications prior to visit.     Review of Systems;  Patient denies headache, fevers, malaise, unintentional weight loss, skin rash, eye pain, sinus congestion and sinus pain, sore throat, dysphagia,  hemoptysis , cough,  wheezing, chest pain, palpitations, orthopnea, edema, abdominal pain, nausea, melena, diarrhea, constipation, flank pain, dysuria, hematuria, urinary  Frequency, nocturia, numbness, tingling, seizures,  Focal weakness, Loss of consciousness,  Tremor, insomnia, depression, anxiety, and suicidal ideation.      Objective:  BP 110/70   Pulse 70   Temp (!) 96.8 F (36 C) (Temporal)   Ht _0  (1.626 m)   Wt 251 lb 12.8 oz (114.2 kg)   SpO2 92%   BMI 43.22 kg/m   BP Readings from Last 3 Encounters:  05/28/19  110/70  05/27/19 112/78  05/13/19 134/74    Wt Readings from Last 3 Encounters:  05/28/19 251 lb 12.8 oz (114.2 kg)  05/27/19 251 lb 4 oz (114 kg)  05/13/19 252 lb 12.8 oz (114.7 kg)    General appearance: alert, cooperative and appears stated age Ears: normal TM's and external ear canals both ears Throat: lips, mucosa, and tongue normal; teeth and gums normal Neck: no adenopathy, no carotid bruit, supple, symmetrical, trachea midline and thyroid not enlarged, symmetric, no tenderness/mass/nodules Back: symmetric, no curvature. ROM normal. No CVA tenderness.  Lungs: clear to auscultation bilaterally Heart: regular rate and rhythm, S1, S2 normal, no murmur, click, rub or gallop Abdomen: soft, non-tender; bowel sounds normal; no masses,  no organomegaly Pulses: 2+ and symmetric Skin: Skin color, texture, turgor normal. No rashes or lesions Lymph nodes: Cervical, supraclavicular, and axillary nodes normal.  Lab Results  Component Value Date   HGBA1C 7.4 (H) 03/06/2019   HGBA1C 6.5 08/14/2018   HGBA1C 7.9 (H) 02/24/2018    Lab Results  Component Value Date   CREATININE 0.88 03/06/2019   CREATININE 0.95 01/01/2019   CREATININE 0.93 10/22/2018    Lab Results  Component Value Date   WBC 8.3 01/01/2019   HGB 12.1 01/01/2019   HCT 37.7 01/01/2019   PLT 295.0 01/01/2019   GLUCOSE 122 (H) 03/06/2019   CHOL 193 02/24/2018   TRIG 197.0 (H) 02/24/2018   HDL 51.40 02/24/2018   LDLDIRECT 111.0 06/16/2015   LDLCALC 102 (H) 02/24/2018   ALT 9 03/06/2019   AST 14 03/06/2019   NA 141 03/06/2019   K 4.0 03/06/2019   CL 103 03/06/2019   CREATININE 0.88 03/06/2019   BUN 12 03/06/2019   CO2 23 03/06/2019   TSH 3.50 01/01/2019   HGBA1C 7.4 (H) 03/06/2019   MICROALBUR 2.8 (H) 06/04/2017    No results found.  Assessment & Plan:   Problem List Items Addressed This Visit      Unprioritized   Diabetes mellitus type II, controlled ([place]) - Primary    Advised to check post  prandials twice daily for the next week so meds can be adjusted.  a1c is due Nov 21  Lab Results  Component Value Date   HGBA1C 7.4 (H) 03/06/2019         Preoperative clearance    She is considered moderate risk due to asymptomatic CAD,  OSA on CPAP, Type 2 DM  (recent loss of control ).  Her main medical risk is diabetes control and will likely  require sliding scale insulin to manage post operative hyperglycemia      Major depressive disorder with current active episode    Continue celexa , increase wellbutrin dose to 150 mg daily       Relevant Medications   buPROPion (WELLBUTRIN XL) 150 MG 24 hr tablet   Morbid obesity ([place])      I am having Tia Masker. Arzuaga start on buPROPion. I am also having her maintain her albuterol, aspirin EC, traMADol, nystatin, pregabalin, SYRINGE 3CC/25GX1", NONFORMULARY OR COMPOUNDED ITEM, ONE TOUCH DELICA LANCING DEV, OneTouch Delica Lancets Fine, clotrimazole-betamethasone, rosuvastatin, ondansetron, levETIRAcetam, cyanocobalamin, cholestyramine light, dicyclomine, OneTouch Verio, Anoro Ellipta, buPROPion, benzonatate, Lantus SoloStar, Insulin Pen Needle, Eszopiclone, diazepam, OneTouch Verio, citalopram, amoxicillin, ibuprofen, and butalbital-acetaminophen-caffeine.  Meds ordered this encounter  Medications  . buPROPion (WELLBUTRIN XL) 150 MG 24 hr tablet    Sig: Take 1 tablet (150 mg total) by mouth daily.    Dispense:  90 tablet    Refill:  1  . DISCONTD: butalbital-acetaminophen-caffeine (FIORICET) 50-325-40 MG tablet    Sig: Take 1 tablet by mouth every 6 (six) hours as needed for headache.    Dispense:  90 tablet    Refill:  0  . DISCONTD: butalbital-acetaminophen-caffeine (FIORICET) 50-325-40 MG tablet    Sig: Take 1 tablet by mouth every 6 (six) hours as needed for headache.    Dispense:  90 tablet    Refill:  0  . butalbital-acetaminophen-caffeine (FIORICET) 50-325-40 MG tablet    Sig: Take 1 tablet by  mouth every 6 (six) hours as  needed for headache.    Dispense:  90 tablet    Refill:  5    Medications Discontinued During This Encounter  Medication Reason  . butalbital-acetaminophen-caffeine (FIORICET) 50-325-40 MG tablet Reorder  . butalbital-acetaminophen-caffeine (FIORICET) 50-325-40 MG tablet Reorder  . butalbital-acetaminophen-caffeine (FIORICET) 50-325-40 MG tablet     Follow-up: No follow-ups on file.   [name], MD

## 2019-05-28 NOTE — Patient Instructions (Signed)
I have increased the wellbutrin  Dose to 150 mg in the morning and 75 mg in the afternoon  The NEW prescription will be coming from mail order  And is 150 mg ONCE DAILY IN THE MORNING    Please check your blood sugars twice daily:  1) fasting and 2) 2 hours after a meal (you can vary from day to day which meal you check)  Send me your blood sugars in 1  Week  TO THE OFFICE   [phone].  e. If your sugars are still too high we will need to change your LANTUS DOSE  or add additional MEALTIME INSULIN

## 2019-05-30 DIAGNOSIS — F329 Major depressive disorder, single episode, unspecified: Secondary | ICD-10-CM | POA: Insufficient documentation

## 2019-05-30 NOTE — Assessment & Plan Note (Signed)
Continue celexa , increase wellbutrin dose to 150 mg daily

## 2019-05-30 NOTE — Assessment & Plan Note (Signed)
Advised to check post prandials twice daily for the next week so meds can be adjusted.  a1c is due Nov 21  Lab Results  Component Value Date   HGBA1C 7.4 (H) 03/06/2019

## 2019-05-30 NOTE — Assessment & Plan Note (Signed)
She is considered moderate risk due to asymptomatic CAD,  OSA on CPAP, Type 2 DM  (recent loss of control ).  Her main medical risk is diabetes control and will likely  require sliding scale insulin to manage post operative hyperglycemia

## 2019-06-03 ENCOUNTER — Other Ambulatory Visit: Payer: Self-pay

## 2019-06-03 ENCOUNTER — Encounter
Admission: RE | Admit: 2019-06-03 | Discharge: 2019-06-03 | Disposition: A | Discharge status: Home / Self Care | Payer: Medicare Other | Source: Ambulatory Visit | Attending: Internal Medicine | Admitting: Internal Medicine

## 2019-06-03 DIAGNOSIS — Z01810 Encounter for preprocedural cardiovascular examination: Secondary | ICD-10-CM | POA: Diagnosis present

## 2019-06-03 DIAGNOSIS — R0609 Other forms of dyspnea: Secondary | ICD-10-CM

## 2019-06-03 DIAGNOSIS — R0600 Dyspnea, unspecified: Secondary | ICD-10-CM | POA: Insufficient documentation

## 2019-06-03 MED ORDER — TECHNETIUM TC 99M TETROFOSMIN IV KIT
10.0000 | PACK | Freq: Once | INTRAVENOUS | Status: AC | PRN
  Administered 2019-06-03: 10.76 via INTRAVENOUS

## 2019-06-03 MED ORDER — REGADENOSON 0.4 MG/5ML IV SOLN
0.4000 mg | Freq: Once | INTRAVENOUS | Status: AC
  Administered 2019-06-03: 0.4 mg via INTRAVENOUS
  Filled 2019-06-03: qty 5

## 2019-06-03 MED ORDER — TECHNETIUM TC 99M TETROFOSMIN IV KIT
32.4200 | PACK | Freq: Once | INTRAVENOUS | Status: AC | PRN
  Administered 2019-06-03: 32.42 via INTRAVENOUS

## 2019-06-04 ENCOUNTER — Encounter: Payer: Self-pay | Admitting: Plastic Surgery

## 2019-06-04 ENCOUNTER — Ambulatory Visit (INDEPENDENT_AMBULATORY_CARE_PROVIDER_SITE_OTHER): Payer: Medicare Other | Admitting: Plastic Surgery

## 2019-06-04 VITALS — BP 153/84 | HR 63 | Temp 97.1°F | Ht 64.0 in | Wt 257.2 lb

## 2019-06-04 DIAGNOSIS — E65 Localized adiposity: Secondary | ICD-10-CM

## 2019-06-04 NOTE — Progress Notes (Signed)
Dr. Claudia Desanctis,  I'm sure you've seen this, but just in case.

## 2019-06-04 NOTE — Progress Notes (Signed)
Referring Provider [name], MD [place],  [place] [address]   CC:  Chief Complaint  Patient presents with  . Pre-op Exam    Panniculectomy      Denise Sharp is an 73 y.o. female.  HPI: Patient is here preop for her panniculectomy.  She is feels comfortable with the procedure and is anxious to move forward.  She would like me to address her upper abdomen but I reinforced that that will not be part of this procedure as it had risk and is not the main functional concern.  Otherwise she just has questions about postoperative care.  Allergies  Allergen Reactions  . Biaxin [Clarithromycin] Nausea And Vomiting and Other (See Comments)    Severe irritable bowel  . Amoxicillin Diarrhea and Other (See Comments)    .PATIENT HAS HAD A PCN REACTION WITH IMMEDIATE RASH, FACIAL/TONGUE/THROAT SWELLING, SOB, OR LIGHTHEADEDNESS WITH HYPOTENSION:  #  #  #  YES  #  #  #   Has patient had a PCN reaction causing severe rash involving mucus membranes or skin necrosis: No Has patient had a PCN reaction that required hospitalization: No Has patient had a PCN reaction occurring within the last 10 years: #  #  #  YES  #  #  #  TOLERATED cefazolin pre-op 08/13/17  . Demeclocycline Hives  . Erythromycin Nausea And Vomiting and Other (See Comments)    Severe irritable bowel  . Flagyl [Metronidazole] Nausea And Vomiting and Other (See Comments)    Severe irritable bowel  . Glucophage [Metformin Hcl] Nausea And Vomiting and Other (See Comments)    "Sick" "I won't take anything that has metformin in it"  . Tetracyclines & Related Hives and Rash  . Diovan [Valsartan] Nausea Only       . Sulfa Antibiotics Rash and Other (See Comments)    As child  . Xanax [Alprazolam] Other (See Comments)    UNSPECIFIED REACTION, unknown    Outpatient Encounter Medications as of 06/04/2019  Medication Sig Note  . albuterol (PROVENTIL HFA;VENTOLIN HFA) 108 (90 Base) MCG/ACT inhaler  Inhale 2 puffs into the lungs every 8 (eight) hours as needed for wheezing or shortness of breath.   [name] amoxicillin (AMOXIL) 500 MG capsule Take 500 mg by mouth 2 (two) times daily.   [name] aspirin EC 81 MG tablet Take 1 tablet (81 mg total) by mouth daily. (Patient taking differently: Take 81 mg by mouth 2 (two) times daily. )   . benzonatate (TESSALON) 200 MG capsule Take 1 capsule (200 mg total) by mouth 2 (two) times daily as needed for cough.   . Blood Glucose Monitoring Suppl (ONETOUCH VERIO) w/Device KIT 1 Device by Does not apply route daily. Use to test blood glucose once daily. Dx code = E11.9   . buPROPion (WELLBUTRIN XL) 150 MG 24 hr tablet Take 1 tablet (150 mg total) by mouth daily.   [name] buPROPion (WELLBUTRIN) 75 MG tablet TAKE 1 TABLET BY MOUTH TWICE A DAY   . butalbital-acetaminophen-caffeine (FIORICET) 50-325-40 MG tablet Take 1 tablet by mouth every 6 (six) hours as needed for headache.   . cholestyramine light (PREVALITE) 4 g packet Take by mouth.   . citalopram (CELEXA) 10 MG tablet Take 1 tablet (10 mg total) by mouth daily.   . clotrimazole-betamethasone (LOTRISONE) cream Apply 1 application topically 2 (two) times daily.   . cyanocobalamin (,VITAMIN B-12,) 1000 MCG/ML injection Inject 1 ml (1000 mcg )  IM weekly x 4,  Then monthly thereafter   . diazepam (VALIUM) 5 MG tablet Take 1 tablet (5 mg total) by mouth at bedtime as needed for anxiety.   . dicyclomine (BENTYL) 10 MG capsule Take 1 capsule (10 mg total) by mouth 4 (four) times daily -  before meals and at bedtime.   . Eszopiclone 3 MG TABS Take 1 tablet (3 mg total) by mouth at bedtime.   [name] ibuprofen (ADVIL) 600 MG tablet Take 600 mg by mouth every 6 (six) hours as needed.   . Insulin Glargine (LANTUS SOLOSTAR) 100 UNIT/ML Solostar Pen Inject 11 Units into the skin daily. (Patient taking differently: Inject 11-13 Units into the skin daily. )   . Insulin Pen Needle 31G X 5 MM MISC Use to inject insulin daily   . Lancet Devices  (ONE TOUCH DELICA LANCING DEV) MISC Use to test blood glucose once daily. Dx code = E11.9   . levETIRAcetam (KEPPRA) 750 MG tablet Take 1 tablet (750 mg total) by mouth at bedtime. (Patient taking differently: Take 750 mg by mouth daily as needed. )   . NONFORMULARY OR COMPOUNDED [place]  Combination Pain Cream -  Baclofen 2%, Doxepin 5%, Gabapentin 6%, Topiramate 2%, Pentoxifylline 3% Apply 1-2 grams to affected area 3-4 times daily Qty. 120 gm 3 refills   . nystatin [facility]) powder Apply topically 4 (four) times daily.   . ondansetron (ZOFRAN) 4 MG tablet Take 1 tablet (4 mg total) by mouth every 8 (eight) hours as needed for nausea or vomiting.   Glory Rosebush DELICA LANCETS FINE MISC Use to test blood sguar once daily. Dx code = E11.9   . ONETOUCH VERIO test strip USE TO TEST BLOOD SUGAR ONCE DAILY. DX CODE = E11.9   . pregabalin (LYRICA) 100 MG capsule Take 100 mg by mouth daily. 12/16/2017: Only takes if Keppra does not help for neuropathic pain  . rosuvastatin (CRESTOR) 10 MG tablet Take 1 tablet (10 mg total) by mouth daily.   . Syringe/Needle, Disp, (SYRINGE 3CC/25GX1") 25G X 1" 3 ML MISC Use for b12 injections   . traMADol (ULTRAM) 50 MG tablet Take 0.5 tablets (25 mg total) by mouth every 12 (twelve) hours as needed for severe pain.   [name] umeclidinium-vilanterol (ANORO ELLIPTA) 62.5-25 MCG/INH AEPB Inhale 1 puff into the lungs daily.    No facility-administered encounter medications on file as of 06/04/2019.      Review of Systems General: Denies fevers, chills, major changes in weight.  Physical Exam Vitals with BMI 06/04/2019 05/28/2019 05/27/2019  Height _0  _1  5' 4.5"  Weight 257 lbs 3 oz 251 lbs 13 oz 251 lbs 4 oz  BMI 44.13 97.4 16.38  Systolic 453 646 803  Diastolic 84 70 78  Pulse 63 70 61    General:  No acute distress,  Alert and oriented, Non-Toxic, Normal speech and affect Cardiovascular: Regular rhythm Pulmonary: Unlabored Abdomen: Soft  nontender  Assessment/Plan Patient is here for preoperative visit for panniculectomy.  All of her questions were answered.  We will plan to do an infraumbilical panniculectomy with an incisional wound VAC placement.  She knows she will have drains and will stay 1 or 2 nights in the hospital.  The wound VAC will stay on for 1 to 2 weeks after surgery.  We discussed the risk that include bleeding, infection, demonstrating structures, thigh numbness, need for additional procedures.  I reinforced that this would address the functional concerns regarding  her overhanging infraumbilical pannus.  She is comfortable with this and we will move forward with surgery.  She has obtained clearance from her primary care and cardiologist.  I spent over 30 minutes talking with her today.  [name] 06/04/2019, 3:15 PM

## 2019-06-04 NOTE — H&P (View-Only) (Signed)
Referring Provider [name], MD [place],  [place] [address]   CC:  Chief Complaint  Patient presents with  . Pre-op Exam    Panniculectomy      Denise Sharp is an 73 y.o. female.  HPI: Patient is here preop for her panniculectomy.  She is feels comfortable with the procedure and is anxious to move forward.  She would like me to address her upper abdomen but I reinforced that that will not be part of this procedure as it had risk and is not the main functional concern.  Otherwise she just has questions about postoperative care.  Allergies  Allergen Reactions  . Biaxin [Clarithromycin] Nausea And Vomiting and Other (See Comments)    Severe irritable bowel  . Amoxicillin Diarrhea and Other (See Comments)    .PATIENT HAS HAD A PCN REACTION WITH IMMEDIATE RASH, FACIAL/TONGUE/THROAT SWELLING, SOB, OR LIGHTHEADEDNESS WITH HYPOTENSION:  #  #  #  YES  #  #  #   Has patient had a PCN reaction causing severe rash involving mucus membranes or skin necrosis: No Has patient had a PCN reaction that required hospitalization: No Has patient had a PCN reaction occurring within the last 10 years: #  #  #  YES  #  #  #  TOLERATED cefazolin pre-op 08/13/17  . Demeclocycline Hives  . Erythromycin Nausea And Vomiting and Other (See Comments)    Severe irritable bowel  . Flagyl [Metronidazole] Nausea And Vomiting and Other (See Comments)    Severe irritable bowel  . Glucophage [Metformin Hcl] Nausea And Vomiting and Other (See Comments)    "Sick" "I won't take anything that has metformin in it"  . Tetracyclines & Related Hives and Rash  . Diovan [Valsartan] Nausea Only       . Sulfa Antibiotics Rash and Other (See Comments)    As child  . Xanax [Alprazolam] Other (See Comments)    UNSPECIFIED REACTION, unknown    Outpatient Encounter Medications as of 06/04/2019  Medication Sig Note  . albuterol (PROVENTIL HFA;VENTOLIN HFA) 108 (90 Base) MCG/ACT inhaler  Inhale 2 puffs into the lungs every 8 (eight) hours as needed for wheezing or shortness of breath.   [name] amoxicillin (AMOXIL) 500 MG capsule Take 500 mg by mouth 2 (two) times daily.   [name] aspirin EC 81 MG tablet Take 1 tablet (81 mg total) by mouth daily. (Patient taking differently: Take 81 mg by mouth 2 (two) times daily. )   . benzonatate (TESSALON) 200 MG capsule Take 1 capsule (200 mg total) by mouth 2 (two) times daily as needed for cough.   . Blood Glucose Monitoring Suppl (ONETOUCH VERIO) w/Device KIT 1 Device by Does not apply route daily. Use to test blood glucose once daily. Dx code = E11.9   . buPROPion (WELLBUTRIN XL) 150 MG 24 hr tablet Take 1 tablet (150 mg total) by mouth daily.   [name] buPROPion (WELLBUTRIN) 75 MG tablet TAKE 1 TABLET BY MOUTH TWICE A DAY   . butalbital-acetaminophen-caffeine (FIORICET) 50-325-40 MG tablet Take 1 tablet by mouth every 6 (six) hours as needed for headache.   . cholestyramine light (PREVALITE) 4 g packet Take by mouth.   . citalopram (CELEXA) 10 MG tablet Take 1 tablet (10 mg total) by mouth daily.   . clotrimazole-betamethasone (LOTRISONE) cream Apply 1 application topically 2 (two) times daily.   . cyanocobalamin (,VITAMIN B-12,) 1000 MCG/ML injection Inject 1 ml (1000 mcg )  IM weekly x 4,  Then monthly thereafter   . diazepam (VALIUM) 5 MG tablet Take 1 tablet (5 mg total) by mouth at bedtime as needed for anxiety.   . dicyclomine (BENTYL) 10 MG capsule Take 1 capsule (10 mg total) by mouth 4 (four) times daily -  before meals and at bedtime.   . Eszopiclone 3 MG TABS Take 1 tablet (3 mg total) by mouth at bedtime.   [name] ibuprofen (ADVIL) 600 MG tablet Take 600 mg by mouth every 6 (six) hours as needed.   . Insulin Glargine (LANTUS SOLOSTAR) 100 UNIT/ML Solostar Pen Inject 11 Units into the skin daily. (Patient taking differently: Inject 11-13 Units into the skin daily. )   . Insulin Pen Needle 31G X 5 MM MISC Use to inject insulin daily   . Lancet Devices  (ONE TOUCH DELICA LANCING DEV) MISC Use to test blood glucose once daily. Dx code = E11.9   . levETIRAcetam (KEPPRA) 750 MG tablet Take 1 tablet (750 mg total) by mouth at bedtime. (Patient taking differently: Take 750 mg by mouth daily as needed. )   . NONFORMULARY OR COMPOUNDED [place]  Combination Pain Cream -  Baclofen 2%, Doxepin 5%, Gabapentin 6%, Topiramate 2%, Pentoxifylline 3% Apply 1-2 grams to affected area 3-4 times daily Qty. 120 gm 3 refills   . nystatin [facility]) powder Apply topically 4 (four) times daily.   . ondansetron (ZOFRAN) 4 MG tablet Take 1 tablet (4 mg total) by mouth every 8 (eight) hours as needed for nausea or vomiting.   Glory Rosebush DELICA LANCETS FINE MISC Use to test blood sguar once daily. Dx code = E11.9   . ONETOUCH VERIO test strip USE TO TEST BLOOD SUGAR ONCE DAILY. DX CODE = E11.9   . pregabalin (LYRICA) 100 MG capsule Take 100 mg by mouth daily. 12/16/2017: Only takes if Keppra does not help for neuropathic pain  . rosuvastatin (CRESTOR) 10 MG tablet Take 1 tablet (10 mg total) by mouth daily.   . Syringe/Needle, Disp, (SYRINGE 3CC/25GX1") 25G X 1" 3 ML MISC Use for b12 injections   . traMADol (ULTRAM) 50 MG tablet Take 0.5 tablets (25 mg total) by mouth every 12 (twelve) hours as needed for severe pain.   [name] umeclidinium-vilanterol (ANORO ELLIPTA) 62.5-25 MCG/INH AEPB Inhale 1 puff into the lungs daily.    No facility-administered encounter medications on file as of 06/04/2019.      Review of Systems General: Denies fevers, chills, major changes in weight.  Physical Exam Vitals with BMI 06/04/2019 05/28/2019 05/27/2019  Height _0  _1  5' 4.5"  Weight 257 lbs 3 oz 251 lbs 13 oz 251 lbs 4 oz  BMI 44.13 97.4 16.38  Systolic 453 646 803  Diastolic 84 70 78  Pulse 63 70 61    General:  No acute distress,  Alert and oriented, Non-Toxic, Normal speech and affect Cardiovascular: Regular rhythm Pulmonary: Unlabored Abdomen: Soft  nontender  Assessment/Plan Patient is here for preoperative visit for panniculectomy.  All of her questions were answered.  We will plan to do an infraumbilical panniculectomy with an incisional wound VAC placement.  She knows she will have drains and will stay 1 or 2 nights in the hospital.  The wound VAC will stay on for 1 to 2 weeks after surgery.  We discussed the risk that include bleeding, infection, demonstrating structures, thigh numbness, need for additional procedures.  I reinforced that this would address the functional concerns regarding  her overhanging infraumbilical pannus.  She is comfortable with this and we will move forward with surgery.  She has obtained clearance from her primary care and cardiologist.  I spent over 30 minutes talking with her today.  [name] 06/04/2019, 3:15 PM

## 2019-06-04 NOTE — Telephone Encounter (Signed)
Angie from [facility] calling Needing to check status of clearance - stress test was completed 11/18 Patient was in for preop appointment today Please call Angie at [phone] to discuss or fax completed clearance

## 2019-06-05 LAB — NM MYOCAR MULTI W/SPECT W/WALL MOTION / EF
LV dias vol: 70 mL (ref 46–106)
LV sys vol: 21 mL
Peak HR: 86 {beats}/min
Percent HR: 58 %
Rest HR: 63 {beats}/min
SDS: 0
SRS: 23
SSS: 17
TID: 1.3

## 2019-06-05 NOTE — Telephone Encounter (Signed)
   Primary Cardiologist: [name], MD  Chart reviewed as part of pre-operative protocol coverage. Given past medical history and time since last visit, based on ACC/AHA guidelines, Denise Sharp would be at acceptable risk for the planned procedure without further cardiovascular testing. Recent stress test was normal.   I will route this recommendation to the requesting party via Epic fax function and remove from pre-op pool.  Please call with questions.  [place], [place] 06/05/2019, 2:09 PM

## 2019-06-05 NOTE — Telephone Encounter (Signed)
Dr. Garen Lah is actually the assigned reader and was just notified about the stress test by nuclear medicine.  I will defer reading of the study and additional questions about the test to him.  [name], MD [facility] Pager: [phone]

## 2019-06-05 NOTE — Telephone Encounter (Signed)
Dr End, I called the [facility] nuc med department, they said you were assigned as the reader for her stress test, waiting on stress test report prior to cardiac clearance

## 2019-06-08 ENCOUNTER — Telehealth: Payer: Self-pay

## 2019-06-08 NOTE — Progress Notes (Signed)
[id number] [name], [place] [address] Phone: [phone] Fax: [phone]      Your procedure is scheduled on June 16, 2019.  Report to [facility] Main Entrance "A" at 9:00 A.M., and check in at the Admitting office.  Call this number if you have problems the morning of surgery:  [phone]  Call [phone] if you have any questions prior to your surgery date Monday-Friday 8am-4pm    Remember:  Do not eat or drink after midnight the night before your surgery    Take these medicines the morning of surgery with A SIP OF WATER: amoxicillin (AMOXIL) buPROPion (WELLBUTRIN XL) umeclidinium-vilanterol (ANORO ELLIPTA) acetaminophen (TYLENOL) -as needed benzonatate (TESSALON) - as needed butalbital-acetaminophen-caffeine (FIORICET) - as needed diazepam (VALIUM) - as needed diphenhydrAMINE (BENADRYL) - as needed ondansetron (ZOFRAN) - as needed traMADol (ULTRAM) - as needed  7 days prior to surgery STOP taking any Aspirin (unless otherwise instructed by your surgeon), Aleve, Naproxen, Ibuprofen, Motrin, Advil, Goody's, BC's, all herbal medications, fish oil, and all vitamins.   WHAT DO I DO ABOUT MY DIABETES MEDICATION?  . DO NOT TAKE ANY INSULIN LANTUS THE NIGHT BEFORE SURGERY OR THE MORNING OF SURGERY   HOW TO MANAGE YOUR DIABETES BEFORE AND AFTER SURGERY  Why is it important to control my blood sugar before and after surgery? . Improving blood sugar levels before and after surgery helps healing and can limit problems. . A way of improving blood sugar control is eating a healthy diet by: o  Eating less sugar and carbohydrates o  Increasing activity/exercise o  Talking with your doctor about reaching your blood sugar goals . High blood sugars (greater than 180 mg/dL) can raise your risk of infections and slow your recovery, so you will need to focus on controlling your diabetes during the weeks before  surgery. . Make sure that the doctor who takes care of your diabetes knows about your planned surgery including the date and location.  How do I manage my blood sugar before surgery? . Check your blood sugar at least 4 times a day, starting 2 days before surgery, to make sure that the level is not too high or low. . Check your blood sugar the morning of your surgery when you wake up and every 2 hours until you get to the Short Stay unit. o If your blood sugar is less than 70 mg/dL, you will need to treat for low blood sugar: - Do not take insulin. - Treat a low blood sugar (less than 70 mg/dL) with  cup of clear juice (cranberry or apple), 4 glucose tablets, OR glucose gel. - Recheck blood sugar in 15 minutes after treatment (to make sure it is greater than 70 mg/dL). If your blood sugar is not greater than 70 mg/dL on recheck, call [phone] for further instructions. . Report your blood sugar to the short stay nurse when you get to Short Stay.  . If you are admitted to the hospital after surgery: o Your blood sugar will be checked by the staff and you will probably be given insulin after surgery (instead of oral diabetes medicines) to make sure you have good blood sugar levels. o The goal for blood sugar control after surgery is 80-180 mg/dL.   The Morning of Surgery  Do not wear jewelry, make-up or nail polish.  Do not wear lotions, powders, or perfumes or deodorant  Do not shave 48 hours prior to  surgery.   Do not bring valuables to the hospital.  [facility] is not responsible for any belongings or valuables.  If you are a smoker, DO NOT Smoke 24 hours prior to surgery  If you wear a CPAP at night please bring your mask, tubing, and machine the morning of surgery   Remember that you must have someone to transport you home after your surgery, and remain with you for 24 hours if you are discharged the same day.   Please bring cases for contacts, glasses, hearing aids, dentures or  bridgework because it cannot be worn into surgery.    Leave your suitcase in the car.  After surgery it may be brought to your room.  For patients admitted to the hospital, discharge time will be determined by your treatment team.  Patients discharged the day of surgery will not be allowed to drive home.    Special instructions:   [place]- Preparing For Surgery  Before surgery, you can play an important role. Because skin is not sterile, your skin needs to be as free of germs as possible. You can reduce the number of germs on your skin by washing with CHG (chlorahexidine gluconate) Soap before surgery.  CHG is an antiseptic cleaner which kills germs and bonds with the skin to continue killing germs even after washing.    Oral Hygiene is also important to reduce your risk of infection.  Remember - BRUSH YOUR TEETH THE MORNING OF SURGERY WITH YOUR REGULAR TOOTHPASTE  Please do not use if you have an allergy to CHG or antibacterial soaps. If your skin becomes reddened/irritated stop using the CHG.  Do not shave (including legs and underarms) for at least 48 hours prior to first CHG shower. It is OK to shave your face.  Please follow these instructions carefully.   1. Shower the NIGHT BEFORE SURGERY and the MORNING OF SURGERY with CHG Soap.   2. If you chose to wash your hair, wash your hair first as usual with your normal shampoo.  3. After you shampoo, rinse your hair and body thoroughly to remove the shampoo.  4. Use CHG as you would any other liquid soap. You can apply CHG directly to the skin and wash gently with a scrungie or a clean washcloth.   5. Apply the CHG Soap to your body ONLY FROM THE NECK DOWN.  Do not use on open wounds or open sores. Avoid contact with your eyes, ears, mouth and genitals (private parts). Wash Face and genitals (private parts)  with your normal soap.   6. Wash thoroughly, paying special attention to the area where your surgery will be  performed.  7. Thoroughly rinse your body with warm water from the neck down.  8. DO NOT shower/wash with your normal soap after using and rinsing off the CHG Soap.  9. Pat yourself dry with a CLEAN TOWEL.  10. Wear CLEAN PAJAMAS to bed the night before surgery, wear comfortable clothes the morning of surgery  11. Place CLEAN SHEETS on your bed the night of your first shower and DO NOT SLEEP WITH PETS.    Day of Surgery:  Please shower the morning of surgery with the CHG soap Do not apply any deodorants/lotions. Please wear clean clothes to the hospital/surgery center.   Remember to brush your teeth WITH YOUR REGULAR TOOTHPASTE.   Please read over the following fact sheets that you were given.

## 2019-06-08 NOTE — Telephone Encounter (Signed)
Faxed orders for Wound Vac to [facility] per Dr. Claudia Desanctis

## 2019-06-09 ENCOUNTER — Encounter (HOSPITAL_COMMUNITY)
Admission: RE | Admit: 2019-06-09 | Discharge: 2019-06-09 | Disposition: A | Discharge status: Home / Self Care | Payer: Medicare Other | Source: Ambulatory Visit | Attending: Plastic Surgery | Admitting: Plastic Surgery

## 2019-06-09 ENCOUNTER — Other Ambulatory Visit: Payer: Self-pay

## 2019-06-09 DIAGNOSIS — M4802 Spinal stenosis, cervical region: Secondary | ICD-10-CM | POA: Diagnosis not present

## 2019-06-09 DIAGNOSIS — Z792 Long term (current) use of antibiotics: Secondary | ICD-10-CM | POA: Insufficient documentation

## 2019-06-09 DIAGNOSIS — Z7982 Long term (current) use of aspirin: Secondary | ICD-10-CM | POA: Diagnosis not present

## 2019-06-09 DIAGNOSIS — F329 Major depressive disorder, single episode, unspecified: Secondary | ICD-10-CM | POA: Insufficient documentation

## 2019-06-09 DIAGNOSIS — Z9884 Bariatric surgery status: Secondary | ICD-10-CM | POA: Insufficient documentation

## 2019-06-09 DIAGNOSIS — M1990 Unspecified osteoarthritis, unspecified site: Secondary | ICD-10-CM | POA: Diagnosis not present

## 2019-06-09 DIAGNOSIS — Z01812 Encounter for preprocedural laboratory examination: Secondary | ICD-10-CM | POA: Diagnosis not present

## 2019-06-09 DIAGNOSIS — E65 Localized adiposity: Secondary | ICD-10-CM | POA: Diagnosis not present

## 2019-06-09 DIAGNOSIS — I2510 Atherosclerotic heart disease of native coronary artery without angina pectoris: Secondary | ICD-10-CM | POA: Insufficient documentation

## 2019-06-09 DIAGNOSIS — G4733 Obstructive sleep apnea (adult) (pediatric): Secondary | ICD-10-CM | POA: Insufficient documentation

## 2019-06-09 DIAGNOSIS — Z96653 Presence of artificial knee joint, bilateral: Secondary | ICD-10-CM | POA: Insufficient documentation

## 2019-06-09 DIAGNOSIS — Z79899 Other long term (current) drug therapy: Secondary | ICD-10-CM | POA: Diagnosis not present

## 2019-06-09 DIAGNOSIS — Z9049 Acquired absence of other specified parts of digestive tract: Secondary | ICD-10-CM | POA: Diagnosis not present

## 2019-06-09 DIAGNOSIS — Z794 Long term (current) use of insulin: Secondary | ICD-10-CM | POA: Diagnosis not present

## 2019-06-09 DIAGNOSIS — K589 Irritable bowel syndrome without diarrhea: Secondary | ICD-10-CM | POA: Diagnosis not present

## 2019-06-09 DIAGNOSIS — Z90710 Acquired absence of both cervix and uterus: Secondary | ICD-10-CM | POA: Insufficient documentation

## 2019-06-09 DIAGNOSIS — I10 Essential (primary) hypertension: Secondary | ICD-10-CM | POA: Insufficient documentation

## 2019-06-09 DIAGNOSIS — E119 Type 2 diabetes mellitus without complications: Secondary | ICD-10-CM | POA: Insufficient documentation

## 2019-06-09 DIAGNOSIS — I447 Left bundle-branch block, unspecified: Secondary | ICD-10-CM | POA: Insufficient documentation

## 2019-06-09 DIAGNOSIS — Z6841 Body Mass Index (BMI) 40.0 and over, adult: Secondary | ICD-10-CM | POA: Insufficient documentation

## 2019-06-09 LAB — BASIC METABOLIC PANEL
Anion gap: 10 (ref 5–15)
BUN: 11 mg/dL (ref 8–23)
CO2: 22 mmol/L (ref 22–32)
Calcium: 8.7 mg/dL — ABNORMAL LOW (ref 8.9–10.3)
Chloride: 107 mmol/L (ref 98–111)
Creatinine, Ser: 0.95 mg/dL (ref 0.44–1.00)
GFR calc Af Amer: >60 mL/min (ref >60)
GFR calc non Af Amer: 59 mL/min — ABNORMAL LOW (ref >60)
Glucose, Bld: 160 mg/dL — ABNORMAL HIGH (ref 70–99)
Potassium: 4.1 mmol/L (ref 3.5–5.1)
Sodium: 139 mmol/L (ref 135–145)

## 2019-06-09 LAB — HEMOGLOBIN A1C
Hgb A1c MFr Bld: 7.2 % — ABNORMAL HIGH (ref 4.8–5.6)
Mean Plasma Glucose: 159.94 mg/dL

## 2019-06-09 LAB — CBC
HCT: 38.4 % (ref 36.0–46.0)
Hemoglobin: 11.8 g/dL — ABNORMAL LOW (ref 12.0–15.0)
MCH: 27.2 pg (ref 26.0–34.0)
MCHC: 30.7 g/dL (ref 30.0–36.0)
MCV: 88.5 fL (ref 80.0–100.0)
Platelets: 336 10*3/uL (ref 150–400)
RBC: 4.34 MIL/uL (ref 3.87–5.11)
RDW: 17.1 % — ABNORMAL HIGH (ref 11.5–15.5)
WBC: 10.9 10*3/uL — ABNORMAL HIGH (ref 4.0–10.5)
nRBC: 0 % (ref 0.0–0.2)

## 2019-06-09 LAB — GLUCOSE, CAPILLARY: Glucose-Capillary: 197 mg/dL — ABNORMAL HIGH (ref 70–99)

## 2019-06-09 NOTE — Progress Notes (Addendum)
Covid test scheduled on 11/27. No h/o recent travel. No h/o exposure to Covid positive patients. No sx concerning for Covid.  PCP - Dr Deborra Medina  Cardiologist - Dr Saunders Revel, [place] [facility]ave  Chest x-ray -   EKG - 05-27-19  Stress Test - 06-03-19  ECHO - 08-21-17  Cardiac Cath - 07-24-12  AICD-denies PM-denies LOOP-denies  Sleep Study - Y CPAP - Y  LABS-CBC,BMP,A1C  ASA-denies  ERAS-NA  HA1C-today  (7.4 in Aug) Fasting Blood Sugar - 197 Lo- 110  Hi-200 Checks Blood Sugar __1___ times a day  Anesthesia-Y H/o LBBB, obesity,Type 2 DM  Pt denies having chest pain, sob, or fever at this time. All instructions explained to the pt, with a verbal understanding of the material. Pt agrees to go over the instructions while at home for a better understanding. Pt also instructed to self quarantine after being tested for COVID-19. The opportunity to ask questions was provided.

## 2019-06-10 NOTE — Progress Notes (Signed)
Anesthesia Chart Review:  Case: [id number] Date/Time: 06/16/19 1045   Procedure: PANNICULECTOMY (N/A Abdomen) - 3 hours, please   Anesthesia type: General   Pre-op diagnosis: abdominal pannus   Location: [facility] OR ROOM 06 / [place] [facility]   Surgeon: [name], MD      DISCUSSION: Patient is a 73 year old female scheduled for the above procedure.  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 BMI is consistent with morbid obesity.  Prior to 07/2017 shoulder surgery she had requested no spinal anesthesia due to DDD and reported she could not be near MRI due to her bladder implant.  Preoperative medical risk assessment per [name] on 05/30/19: "She is considered moderate risk due to asymptomatic CAD,  OSA on CPAP, Type 2 DM  (recent loss of control ).  Her main medical risk is diabetes control and will likely  require sliding scale insulin to manage post operative hyperglycemia".  Preoperative cardiology risk assessment by Dr. Saunders Revel following 06/03/19 stress test: "Please let Ms. Barretta know that her stress test is low risk without evidence of a significant blockage. Her heart is contracting normally. I think it is reasonable for her to proceed with her panniculectomy without additional cardiac testing or intervention."  She denied chest pain, shortness of breath, and fever at PAT RN visit.  Presurgical COVID-19 test is scheduled for 06/12/19.  If negative and otherwise no acute changes and I would anticipate that she could proceed as  planned.   VS: BP (!) 150/75   Pulse 65   Temp 37 C (Oral)   Resp 20   Ht 5' 4.5" (1.638 m)   Wt 113.9 kg   SpO2 97%   BMI 42.42 kg/m    PROVIDERS: [name], MD is PCP End, Harrell Gave, MD is cardiologist. Previously saw Neoma Laming, MD (~ 2018), Kathlyn Sacramento (2013), and Lujean Amel, MD. Kathrynn Ducking, MD is neurologist Baird Lyons, MD is pulmonologist   LABS: Labs reviewed: Acceptable for surgery. (all labs ordered are listed, but only abnormal results are displayed)  Labs Reviewed  GLUCOSE, CAPILLARY - Abnormal; Notable for the following components:      Result Value   Glucose-Capillary 197 (*)    All other components within normal limits  HEMOGLOBIN A1C - Abnormal; Notable for the following components:   Hgb A1c MFr Bld 7.2 (*)    All other components within normal limits  BASIC METABOLIC PANEL - Abnormal; Notable for the following components:   Glucose, Bld 160 (*)    Calcium 8.7 (*)    GFR calc non Af Amer 59 (*)    All other components within normal limits  CBC - Abnormal; Notable for the following components:   WBC 10.9 (*)    Hemoglobin 11.8 (*)    RDW 17.1 (*)    All other components within normal limits    OTHER: PFTs 05/04/19: FVC 2.53 (87%), post 2.52 (87%). FEV1 2.16 (99%), post 2.22 (102%). DLCO unc 16.47 (85%).    IMAGES: PCXR 10/15/18: FINDINGS: - Mild exaggeration of the cardiac silhouette related AP technique. Transverse heart size at the upper limits of normal. Mediastinal silhouette within normal  limits. - Lungs normally inflated. Mild left basilar atelectasis and/or scarring. No focal infiltrates. No pulmonary edema or pleural effusion. No pneumothorax. - No acute osseous finding. Right shoulder arthroplasty partially visualized. IMPRESSION: Mild left basilar atelectasis and/or scarring. No other active cardiopulmonary disease.  CT C-spine 08/20/18: IMPRESSION: - Multilevel spondylosis. No cervical spine  fracture or traumatic subluxation. - Correlate clinically for LEFT arm symptomatology related to facet arthropathy, worst at C6-7 on the LEFT, or uncinate spurring, noted at C5-6 bilaterally.   EKG: 05/27/19: NSR with sinus arrhythmia.   CV: Nuclear stress test 06/03/19:  There was no ST segment deviation noted during stress.  There was no evidence of ischemia  There is a small defect of mild severity present at rest. This is likely a diaphragmatic artifact.  This is a low risk study.  The left ventricular ejection fraction is hyperdynamic (75%).  The study is normal.   Echo 08/21/17: Study Conclusions - Left ventricle: The cavity size was normal. Wall thickness was   normal. Systolic function was vigorous. The estimated ejection   fraction was in the range of 65% to 70%. Wall motion was normal;   there were no regional wall motion abnormalities. Features are   consistent with a pseudonormal left ventricular filling pattern,   with concomitant abnormal relaxation and increased filling   pressure (grade 2 diastolic dysfunction). - Pulmonary arteries: Systolic pressure was mildly increased. PA   peak pressure: 33 mm Hg (S).   Other cardiac testing as outlined in 05/27/19 Progress note by Dr. Saunders Revel:  LHC (07/24/12, [facility]): LMCA normal. LAD, LCx, and RCA with minor luminal irregularities.  LHC (04/15/86, [facility]): Normal LMCA, LAD, LCx, and RCA. LVEF 65%.  Cardiac CT calcium score (09/03/16, Dr. Humphrey Rolls): Coronary calcium score 1548.   Carotid U/S 09/25/16 ([place], scanned under Results Review tab):  LCCA velocity 84.9 cm/s. LICA velocity 009.3 cm/s. LECA velocity 136.0 cm/s. Left VA velocity 99.3 cm/s. RCCA velocity 88.1 cm/s. RICA velocity 106.0 cm/s. RECA velocity 102.0 cm/s. Right VA velocity 68.7 cm/s. Conclusions: Borderline mild disease in left bulb with antegrade flow in vertebrals.  Consider CT of carotids.    Past Medical History:  Diagnosis  Date  . Abnormality of gait 03/25/2013  . Adrenal mass, left ([place])   . Anemia    iron deficient post  2 unit txfsn 2009, normal endo/colonoscopy by [facility]  . Arthritis   . Atypical chest pain    a. 04/1986 Cath ([facility]): nl cors, EF 65%; b. 07/2012 Cath [facility]): LM nl, LAD/LCX/RCA w/ min irregs; c. 07/2016 MV Humphrey Rolls): "Equivocal" w/ small inf wall rev defect and hyperdynamic LV contraction, EF 87%; d. 08/2016 Cardiac CT Ca2+ score Humphrey Rolls): Ca2+ score 1548.  [name] Cervical spinal stenosis 1994   due to trauma to back ([employer] accident), has intermittent paralysis and parasthesias  . Cervicogenic headache 03/23/2014  . Depression   . Dizziness    chronic dizziness  . DJD (degenerative joint disease)    a. Chronic R shoulder pain pending R shoulder replacement 07/2017.  [name] Esophageal stenosis March 2011   with transietn outlet obstruction by food, cleared by EGD   . Family history of adverse reaction to anesthesia    daughter PONV  . Gastric bypass status for obesity   . Headache(784.0)   . Hypertension   . IBS (irritable bowel syndrome)   . Left bundle branch block (LBBB)    a. Intermittently present - likely rate related. - patient denies  . Obesity   .  Obstructive sleep apnea    using CPAP  . 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 Restless leg syndrome   . Rotator cuff arthropathy, right 08/13/2017  . Syncope and collapse 03/12/2014  . Type II diabetes mellitus ([place])     Past Surgical History:  Procedure Laterality Date  . ABDOMINAL HYSTERECTOMY    . APPENDECTOMY    . COLONOSCOPY    . DIAPHRAGMATIC HERNIA REPAIR  2015  . ESOPHAGEAL DILATION     multiple  . ESOPHAGOGASTRODUODENOSCOPY    . [place]  . GALLBLADDER SURGERY  resection  . GASTRIC BYPASS    . GASTRIC BYPASS  2000, 2005   Dr. Debroah Loop  . [facility] IMPLANT PLACEMENT  April 2013   [place]  . JOINT REPLACEMENT  2007   bilateral knee. [name],  [name]  . REVERSE SHOULDER ARTHROPLASTY Right 08/13/2017    Procedure: REVERSE RIGHT SHOULDER ARTHROPLASTY;  Surgeon: [name], MD;  Location: [place];  Service: Orthopedics;  Laterality: Right;  . ROTATOR CUFF REPAIR     right  . [place]  . TOTAL ABDOMINAL HYSTERECTOMY W/ BILATERAL SALPINGOOPHORECTOMY  1974  . UMBILICAL HERNIA REPAIR  Aug 11, 2015    MEDICATIONS: . acetaminophen (TYLENOL) 650 MG CR tablet  . albuterol (PROVENTIL HFA;VENTOLIN HFA) 108 (90 Base) MCG/ACT inhaler  . amoxicillin (AMOXIL) 500 MG capsule  . aspirin EC 81 MG tablet  . benzonatate (TESSALON) 200 MG capsule  . Blood Glucose Monitoring Suppl (ONETOUCH VERIO) w/Device KIT  . buPROPion (WELLBUTRIN XL) 150 MG 24 hr tablet  . buPROPion (WELLBUTRIN) 75 MG tablet  . butalbital-acetaminophen-caffeine (FIORICET) 50-325-40 MG tablet  . cholestyramine light (PREVALITE) 4 g packet  . citalopram (CELEXA) 10 MG tablet  . clotrimazole-betamethasone (LOTRISONE) cream  . cyanocobalamin (,VITAMIN B-12,) 1000 MCG/ML injection  . diazepam (VALIUM) 5 MG tablet  . dicyclomine (BENTYL) 10 MG capsule  . diphenhydrAMINE (BENADRYL) 25 MG tablet  . Eszopiclone 3 MG TABS  . ibuprofen (ADVIL) 600 MG tablet  . Insulin Glargine (LANTUS SOLOSTAR) 100 UNIT/ML Solostar Pen  . Insulin Pen Needle 31G X 5 MM MISC  . Lancet Devices (ONE TOUCH DELICA LANCING DEV) MISC  . levETIRAcetam (KEPPRA) 750 MG tablet  . NONFORMULARY OR COMPOUNDED ITEM  . nystatin [facility]) powder  . ondansetron (ZOFRAN) 4 MG tablet  . ONETOUCH DELICA LANCETS FINE MISC  . ONETOUCH VERIO test strip  . OVER THE COUNTER MEDICATION  . pregabalin (LYRICA) 100 MG capsule  . rosuvastatin (CRESTOR) 10 MG tablet  . Syringe/Needle, Disp, (SYRINGE 3CC/25GX1") 25G X 1" 3 ML MISC  . traMADol (ULTRAM) 50 MG tablet  . umeclidinium-vilanterol (ANORO ELLIPTA) 62.5-25 MCG/INH AEPB   No current facility-administered medications for this encounter.   ASA on hold for procedure.    Myra Gianotti, PA-C Surgical  Short Stay/Anesthesiology [facility] Phone [phone] [facility] Phone [phone] 06/10/2019 2:12 PM

## 2019-06-10 NOTE — Anesthesia Preprocedure Evaluation (Addendum)
Anesthesia Evaluation  Patient identified by MRN, date of birth, ID band Patient awake    Reviewed: Allergy & Precautions, NPO status , Patient's Chart, lab work & pertinent test results  Airway Mallampati: II  TM Distance: >3 FB Neck ROM: Full    Dental no notable dental hx.    Pulmonary neg pulmonary ROS,    Pulmonary exam normal breath sounds clear to auscultation       Cardiovascular hypertension, + DOE  Normal cardiovascular exam Rhythm:Regular Rate:Normal     Neuro/Psych negative neurological ROS  negative psych ROS   GI/Hepatic negative GI ROS, Neg liver ROS,   Endo/Other  diabetesMorbid obesity  Renal/GU negative Renal ROS  negative genitourinary   Musculoskeletal negative musculoskeletal ROS (+)   Abdominal   Peds negative pediatric ROS (+)  Hematology negative hematology ROS (+)   Anesthesia Other Findings   Reproductive/Obstetrics negative OB ROS                            Anesthesia Physical Anesthesia Plan  ASA: III  Anesthesia Plan: General   Post-op Pain Management:    Induction: Intravenous  PONV Risk Score and Plan: 3 and Ondansetron, Dexamethasone and Treatment may vary due to age or medical condition  Airway Management Planned: Oral ETT  Additional Equipment:   Intra-op Plan:   Post-operative Plan: Extubation in OR  Informed Consent: I have reviewed the patients History and Physical, chart, labs and discussed the procedure including the risks, benefits and alternatives for the proposed anesthesia with the patient or authorized representative who has indicated his/her understanding and acceptance.     Dental advisory given  Plan Discussed with: CRNA and Surgeon  Anesthesia Plan Comments: (PAT note written 06/10/2019 by Myra Gianotti, PA-C. )       Anesthesia Quick Evaluation

## 2019-06-12 ENCOUNTER — Other Ambulatory Visit (HOSPITAL_COMMUNITY)
Admission: RE | Admit: 2019-06-12 | Discharge: 2019-06-12 | Disposition: A | Discharge status: Home / Self Care | Payer: Medicare Other | Source: Ambulatory Visit | Attending: Plastic Surgery | Admitting: Plastic Surgery

## 2019-06-12 ENCOUNTER — Telehealth: Payer: Self-pay | Admitting: Internal Medicine

## 2019-06-12 DIAGNOSIS — Z01812 Encounter for preprocedural laboratory examination: Secondary | ICD-10-CM | POA: Insufficient documentation

## 2019-06-12 DIAGNOSIS — Z20828 Contact with and (suspected) exposure to other viral communicable diseases: Secondary | ICD-10-CM | POA: Insufficient documentation

## 2019-06-12 LAB — SARS CORONAVIRUS 2 (TAT 6-24 HRS): SARS Coronavirus 2: NEGATIVE

## 2019-06-12 NOTE — Telephone Encounter (Signed)
Patient called and would like to talk to someone about her BP sugar being high and her readings. She would like a call back from a nurse today. Please call patient back, thanks.

## 2019-06-15 MED ORDER — VANCOMYCIN HCL 10 G IV SOLR
1500.0000 mg | INTRAVENOUS | Status: AC
  Administered 2019-06-16: 1500 mg via INTRAVENOUS
  Filled 2019-06-15: qty 1500

## 2019-06-15 NOTE — Telephone Encounter (Signed)
NOTHING ELSE RIGHT NOW SINCE SURGERY IS TOMORROW THEY WILL ADUST OR ADD MEALTIME INSULIN IF NEEDED

## 2019-06-15 NOTE — Telephone Encounter (Signed)
Please find out why her message from last wednesday was not forwarded to [place] fro [place]?   She has surgery tomorrow and BS have been elevated   Tell her they are not terrible , but increase lantus to 15 untis daily

## 2019-06-15 NOTE — Telephone Encounter (Signed)
Pt would like to know if you got her fax with all  of her blood sugars.She states last night her blood sugars were 224,this morning 134 after taking 15 units of Lantus. She wants to know does she need to supplement with anything else ?

## 2019-06-15 NOTE — Telephone Encounter (Signed)
Spoke with pt and informed her of the message below. Pt gave a verbal understanding.  

## 2019-06-16 ENCOUNTER — Inpatient Hospital Stay (HOSPITAL_COMMUNITY): Payer: Medicare Other | Admitting: Vascular Surgery

## 2019-06-16 ENCOUNTER — Encounter (HOSPITAL_COMMUNITY)
Admission: RE | Disposition: A | Discharge status: Home Health | Payer: Self-pay | Source: Home / Self Care | Attending: Plastic Surgery

## 2019-06-16 ENCOUNTER — Other Ambulatory Visit: Payer: Self-pay

## 2019-06-16 ENCOUNTER — Inpatient Hospital Stay (HOSPITAL_COMMUNITY)
Admission: RE | Admit: 2019-06-16 | Discharge: 2019-06-18 | DRG: 624 | Disposition: A | Discharge status: Home Health | Payer: Medicare Other | Attending: Plastic Surgery | Admitting: Plastic Surgery

## 2019-06-16 ENCOUNTER — Inpatient Hospital Stay (HOSPITAL_COMMUNITY): Payer: Medicare Other | Admitting: Certified Registered"

## 2019-06-16 DIAGNOSIS — Z6841 Body Mass Index (BMI) 40.0 and over, adult: Secondary | ICD-10-CM

## 2019-06-16 DIAGNOSIS — Z7982 Long term (current) use of aspirin: Secondary | ICD-10-CM

## 2019-06-16 DIAGNOSIS — Z9889 Other specified postprocedural states: Secondary | ICD-10-CM | POA: Diagnosis present

## 2019-06-16 DIAGNOSIS — Z794 Long term (current) use of insulin: Secondary | ICD-10-CM

## 2019-06-16 DIAGNOSIS — E119 Type 2 diabetes mellitus without complications: Secondary | ICD-10-CM | POA: Diagnosis not present

## 2019-06-16 DIAGNOSIS — K5900 Constipation, unspecified: Secondary | ICD-10-CM | POA: Diagnosis not present

## 2019-06-16 DIAGNOSIS — Z20828 Contact with and (suspected) exposure to other viral communicable diseases: Secondary | ICD-10-CM | POA: Diagnosis not present

## 2019-06-16 DIAGNOSIS — I10 Essential (primary) hypertension: Secondary | ICD-10-CM | POA: Diagnosis not present

## 2019-06-16 DIAGNOSIS — E65 Localized adiposity: Principal | ICD-10-CM | POA: Diagnosis present

## 2019-06-16 DIAGNOSIS — Z79899 Other long term (current) drug therapy: Secondary | ICD-10-CM

## 2019-06-16 HISTORY — PX: PANNICULECTOMY: SHX5360

## 2019-06-16 HISTORY — PX: PANNICULECTOMY: SUR1001

## 2019-06-16 LAB — GLUCOSE, CAPILLARY
Glucose-Capillary: 135 mg/dL — ABNORMAL HIGH (ref 70–99)
Glucose-Capillary: 132 mg/dL — ABNORMAL HIGH (ref 70–99)
Glucose-Capillary: 153 mg/dL — ABNORMAL HIGH (ref 70–99)
Glucose-Capillary: 172 mg/dL — ABNORMAL HIGH (ref 70–99)
Glucose-Capillary: 283 mg/dL — ABNORMAL HIGH (ref 70–99)

## 2019-06-16 SURGERY — PANNICULECTOMY
Anesthesia: General | Site: Abdomen

## 2019-06-16 MED ORDER — FENTANYL CITRATE (PF) 250 MCG/5ML IJ SOLN
INTRAMUSCULAR | Status: AC
  Filled 2019-06-16: qty 5

## 2019-06-16 MED ORDER — INSULIN ASPART 100 UNIT/ML ~~LOC~~ SOLN
0.0000 [IU] | Freq: Every day | SUBCUTANEOUS | Status: DC
  Administered 2019-06-16: 3 [IU] via SUBCUTANEOUS

## 2019-06-16 MED ORDER — LIDOCAINE 2% (20 MG/ML) 5 ML SYRINGE
INTRAMUSCULAR | Status: AC
  Filled 2019-06-16: qty 5

## 2019-06-16 MED ORDER — ONDANSETRON 4 MG PO TBDP: 4.0000 mg | ORAL_TABLET | Freq: Four times a day (QID) | ORAL | Status: DC | PRN

## 2019-06-16 MED ORDER — BENZONATATE 100 MG PO CAPS: 200.0000 mg | ORAL_CAPSULE | Freq: Two times a day (BID) | ORAL | Status: DC | PRN

## 2019-06-16 MED ORDER — CITALOPRAM HYDROBROMIDE 20 MG PO TABS
10.0000 mg | ORAL_TABLET | Freq: Every day | ORAL | Status: DC
  Administered 2019-06-17 – 2019-06-18 (×2): 10 mg via ORAL
  Filled 2019-06-16 (×2): qty 1

## 2019-06-16 MED ORDER — EPHEDRINE 5 MG/ML INJ
INTRAVENOUS | Status: AC
  Filled 2019-06-16: qty 10

## 2019-06-16 MED ORDER — DICYCLOMINE HCL 10 MG PO CAPS
10.0000 mg | ORAL_CAPSULE | Freq: Three times a day (TID) | ORAL | Status: DC
  Administered 2019-06-16 – 2019-06-18 (×7): 10 mg via ORAL
  Filled 2019-06-16 (×7): qty 1

## 2019-06-16 MED ORDER — UMECLIDINIUM-VILANTEROL 62.5-25 MCG/INH IN AEPB
1.0000 | INHALATION_SPRAY | Freq: Every day | RESPIRATORY_TRACT | Status: DC
  Administered 2019-06-18: 1 via RESPIRATORY_TRACT
  Filled 2019-06-16 (×2): qty 14

## 2019-06-16 MED ORDER — BUPROPION HCL 75 MG PO TABS
150.0000 mg | ORAL_TABLET | Freq: Every day | ORAL | Status: DC
  Administered 2019-06-17 – 2019-06-18 (×2): 150 mg via ORAL
  Filled 2019-06-16 (×2): qty 2

## 2019-06-16 MED ORDER — MORPHINE SULFATE (PF) 2 MG/ML IV SOLN: 1.0000 mg | INTRAVENOUS | Status: DC | PRN

## 2019-06-16 MED ORDER — EPHEDRINE SULFATE-NACL 50-0.9 MG/10ML-% IV SOSY
PREFILLED_SYRINGE | INTRAVENOUS | Status: DC | PRN
  Administered 2019-06-16: 5 mg via INTRAVENOUS

## 2019-06-16 MED ORDER — LEVETIRACETAM 750 MG PO TABS
750.0000 mg | ORAL_TABLET | Freq: Every evening | ORAL | Status: DC | PRN
  Administered 2019-06-17: 750 mg via ORAL
  Filled 2019-06-16: qty 1

## 2019-06-16 MED ORDER — PROPOFOL 10 MG/ML IV BOLUS
INTRAVENOUS | Status: DC | PRN
  Administered 2019-06-16: 150 mg via INTRAVENOUS
  Administered 2019-06-16: 20 mg via INTRAVENOUS

## 2019-06-16 MED ORDER — ROCURONIUM BROMIDE 50 MG/5ML IV SOSY
PREFILLED_SYRINGE | INTRAVENOUS | Status: DC | PRN
  Administered 2019-06-16: 70 mg via INTRAVENOUS
  Administered 2019-06-16 (×4): 20 mg via INTRAVENOUS

## 2019-06-16 MED ORDER — HEPARIN SODIUM (PORCINE) 5000 UNIT/ML IJ SOLN
5000.0000 [IU] | Freq: Three times a day (TID) | INTRAMUSCULAR | Status: DC
  Administered 2019-06-17 – 2019-06-18 (×5): 5000 [IU] via SUBCUTANEOUS
  Filled 2019-06-16 (×5): qty 1

## 2019-06-16 MED ORDER — OXYCODONE HCL 5 MG/5ML PO SOLN: 5.0000 mg | Freq: Once | ORAL | Status: AC | PRN

## 2019-06-16 MED ORDER — ALBUTEROL SULFATE (2.5 MG/3ML) 0.083% IN NEBU: 2.5000 mg | INHALATION_SOLUTION | Freq: Three times a day (TID) | RESPIRATORY_TRACT | Status: DC | PRN

## 2019-06-16 MED ORDER — SUGAMMADEX SODIUM 200 MG/2ML IV SOLN
INTRAVENOUS | Status: DC | PRN
  Administered 2019-06-16: 200 mg via INTRAVENOUS

## 2019-06-16 MED ORDER — ONDANSETRON HCL 4 MG/2ML IJ SOLN
INTRAMUSCULAR | Status: DC | PRN
  Administered 2019-06-16: 4 mg via INTRAVENOUS

## 2019-06-16 MED ORDER — BUPIVACAINE-EPINEPHRINE 0.5% -1:200000 IJ SOLN
INTRAMUSCULAR | Status: AC
  Filled 2019-06-16: qty 1

## 2019-06-16 MED ORDER — FENTANYL CITRATE (PF) 100 MCG/2ML IJ SOLN
INTRAMUSCULAR | Status: DC | PRN
  Administered 2019-06-16 (×6): 50 ug via INTRAVENOUS

## 2019-06-16 MED ORDER — HYDROMORPHONE HCL 1 MG/ML IJ SOLN
INTRAMUSCULAR | Status: AC
  Filled 2019-06-16: qty 1

## 2019-06-16 MED ORDER — ROCURONIUM BROMIDE 10 MG/ML (PF) SYRINGE
PREFILLED_SYRINGE | INTRAVENOUS | Status: AC
  Filled 2019-06-16: qty 10

## 2019-06-16 MED ORDER — INSULIN GLARGINE 100 UNIT/ML ~~LOC~~ SOLN
11.0000 [IU] | Freq: Every day | SUBCUTANEOUS | Status: DC
  Administered 2019-06-17 – 2019-06-18 (×2): 11 [IU] via SUBCUTANEOUS
  Filled 2019-06-16 (×2): qty 0.11

## 2019-06-16 MED ORDER — OXYCODONE HCL 5 MG PO TABS
ORAL_TABLET | ORAL | Status: AC
  Filled 2019-06-16: qty 1

## 2019-06-16 MED ORDER — ROSUVASTATIN CALCIUM 5 MG PO TABS
10.0000 mg | ORAL_TABLET | Freq: Every day | ORAL | Status: DC
  Administered 2019-06-16 – 2019-06-18 (×3): 10 mg via ORAL
  Filled 2019-06-16 (×3): qty 2

## 2019-06-16 MED ORDER — LIDOCAINE HCL 1 % IJ SOLN
INTRAVENOUS | Status: DC | PRN
  Administered 2019-06-16: 1000 mL

## 2019-06-16 MED ORDER — LIDOCAINE 2% (20 MG/ML) 5 ML SYRINGE
INTRAMUSCULAR | Status: DC | PRN
  Administered 2019-06-16: 80 mg via INTRAVENOUS

## 2019-06-16 MED ORDER — INSULIN ASPART 100 UNIT/ML ~~LOC~~ SOLN
0.0000 [IU] | Freq: Three times a day (TID) | SUBCUTANEOUS | Status: DC
  Administered 2019-06-17 – 2019-06-18 (×3): 3 [IU] via SUBCUTANEOUS
  Administered 2019-06-18: 11 [IU] via SUBCUTANEOUS

## 2019-06-16 MED ORDER — ACETAMINOPHEN 10 MG/ML IV SOLN
INTRAVENOUS | Status: AC
  Filled 2019-06-16: qty 100

## 2019-06-16 MED ORDER — SODIUM CHLORIDE (PF) 0.9 % IJ SOLN
INTRAMUSCULAR | Status: DC | PRN
  Administered 2019-06-16 (×2): 10 mL

## 2019-06-16 MED ORDER — BUPIVACAINE-EPINEPHRINE 0.5% -1:200000 IJ SOLN
INTRAMUSCULAR | Status: DC | PRN
  Administered 2019-06-16: 40 mL

## 2019-06-16 MED ORDER — LACTATED RINGERS IV SOLN: INTRAVENOUS | Status: DC

## 2019-06-16 MED ORDER — ONDANSETRON HCL 4 MG/2ML IJ SOLN: 4.0000 mg | Freq: Four times a day (QID) | INTRAMUSCULAR | Status: DC | PRN

## 2019-06-16 MED ORDER — MIDAZOLAM HCL 2 MG/2ML IJ SOLN
INTRAMUSCULAR | Status: AC
  Filled 2019-06-16: qty 2

## 2019-06-16 MED ORDER — ZOLPIDEM TARTRATE 5 MG PO TABS
5.0000 mg | ORAL_TABLET | Freq: Every evening | ORAL | Status: DC | PRN
  Administered 2019-06-16: 5 mg via ORAL
  Filled 2019-06-16: qty 1

## 2019-06-16 MED ORDER — ALBUMIN HUMAN 5 % IV SOLN
INTRAVENOUS | Status: DC | PRN
  Administered 2019-06-16 (×2): via INTRAVENOUS

## 2019-06-16 MED ORDER — DIAZEPAM 5 MG PO TABS
5.0000 mg | ORAL_TABLET | Freq: Every evening | ORAL | Status: DC | PRN
  Administered 2019-06-16 – 2019-06-17 (×2): 5 mg via ORAL
  Filled 2019-06-16 (×2): qty 1

## 2019-06-16 MED ORDER — LACTATED RINGERS IV SOLN
INTRAVENOUS | Status: DC | PRN
  Administered 2019-06-16 (×2): via INTRAVENOUS

## 2019-06-16 MED ORDER — DEXAMETHASONE SODIUM PHOSPHATE 10 MG/ML IJ SOLN
INTRAMUSCULAR | Status: AC
  Filled 2019-06-16: qty 1

## 2019-06-16 MED ORDER — ACETAMINOPHEN 10 MG/ML IV SOLN
INTRAVENOUS | Status: DC | PRN
  Administered 2019-06-16: 1000 mg via INTRAVENOUS

## 2019-06-16 MED ORDER — HYDROCODONE-ACETAMINOPHEN 5-325 MG PO TABS: 1.0000 | ORAL_TABLET | ORAL | Status: DC | PRN

## 2019-06-16 MED ORDER — SUCCINYLCHOLINE CHLORIDE 200 MG/10ML IV SOSY
PREFILLED_SYRINGE | INTRAVENOUS | Status: AC
  Filled 2019-06-16: qty 10

## 2019-06-16 MED ORDER — HYDROMORPHONE HCL 1 MG/ML IJ SOLN
0.2500 mg | INTRAMUSCULAR | Status: DC | PRN
  Administered 2019-06-16: 0.25 mg via INTRAVENOUS

## 2019-06-16 MED ORDER — PHENYLEPHRINE HCL-NACL 10-0.9 MG/250ML-% IV SOLN
INTRAVENOUS | Status: DC | PRN
  Administered 2019-06-16: 40 ug/min via INTRAVENOUS
  Administered 2019-06-16: 25 ug/min via INTRAVENOUS

## 2019-06-16 MED ORDER — ACETAMINOPHEN 10 MG/ML IV SOLN: 1000.0000 mg | Freq: Once | INTRAVENOUS | Status: DC | PRN

## 2019-06-16 MED ORDER — PHENYLEPHRINE 40 MCG/ML (10ML) SYRINGE FOR IV PUSH (FOR BLOOD PRESSURE SUPPORT)
PREFILLED_SYRINGE | INTRAVENOUS | Status: AC
  Filled 2019-06-16: qty 10

## 2019-06-16 MED ORDER — MIDAZOLAM HCL 5 MG/5ML IJ SOLN
INTRAMUSCULAR | Status: DC | PRN
  Administered 2019-06-16 (×2): 1 mg via INTRAVENOUS

## 2019-06-16 MED ORDER — SODIUM BICARBONATE 4 % IV SOLN
Freq: Once | INTRAVENOUS | Status: DC
  Filled 2019-06-16: qty 50

## 2019-06-16 MED ORDER — BUPROPION HCL 75 MG PO TABS: 75.0000 mg | ORAL_TABLET | Freq: Two times a day (BID) | ORAL | Status: DC

## 2019-06-16 MED ORDER — PROMETHAZINE HCL 25 MG/ML IJ SOLN: 6.2500 mg | INTRAMUSCULAR | Status: DC | PRN

## 2019-06-16 MED ORDER — PROPOFOL 10 MG/ML IV BOLUS
INTRAVENOUS | Status: AC
  Filled 2019-06-16: qty 20

## 2019-06-16 MED ORDER — ONDANSETRON HCL 4 MG/2ML IJ SOLN
INTRAMUSCULAR | Status: AC
  Filled 2019-06-16: qty 2

## 2019-06-16 MED ORDER — BUPROPION HCL 75 MG PO TABS
75.0000 mg | ORAL_TABLET | Freq: Every day | ORAL | Status: DC
  Administered 2019-06-16: 75 mg via ORAL
  Filled 2019-06-16 (×2): qty 1

## 2019-06-16 MED ORDER — DEXAMETHASONE SODIUM PHOSPHATE 10 MG/ML IJ SOLN
INTRAMUSCULAR | Status: DC | PRN
  Administered 2019-06-16: 5 mg via INTRAVENOUS

## 2019-06-16 MED ORDER — OXYCODONE HCL 5 MG PO TABS
5.0000 mg | ORAL_TABLET | Freq: Once | ORAL | Status: AC | PRN
  Administered 2019-06-16: 5 mg via ORAL

## 2019-06-16 MED ORDER — DOCUSATE SODIUM 100 MG PO CAPS
100.0000 mg | ORAL_CAPSULE | Freq: Two times a day (BID) | ORAL | Status: DC
  Administered 2019-06-18: 100 mg via ORAL
  Filled 2019-06-16 (×4): qty 1

## 2019-06-16 MED ORDER — CHOLESTYRAMINE LIGHT 4 G PO PACK
4.0000 g | PACK | Freq: Three times a day (TID) | ORAL | Status: DC | PRN
  Filled 2019-06-16: qty 1

## 2019-06-16 SURGICAL SUPPLY — 59 items
ADH SKN CLS APL DERMABOND .7 (GAUZE/BANDAGES/DRESSINGS) ×2
APL PRP STRL LF DISP 70% ISPRP (MISCELLANEOUS) ×2
APL SKNCLS STERI-STRIP NONHPOA (GAUZE/BANDAGES/DRESSINGS) ×2
BENZOIN TINCTURE PRP APPL 2/3 (GAUZE/BANDAGES/DRESSINGS) ×4 IMPLANT
BINDER ABDOMINAL 12 ML 46-62 (SOFTGOODS) IMPLANT
BIOPATCH RED 1 DISK 7.0 (GAUZE/BANDAGES/DRESSINGS) ×2 IMPLANT
BLADE CLIPPER SURG (BLADE) ×2 IMPLANT
BLADE SURG 10 STRL SS (BLADE) ×8 IMPLANT
CANISTER WOUND CARE 500ML ATS (WOUND CARE) ×1 IMPLANT
CHLORAPREP W/TINT 26 (MISCELLANEOUS) ×3 IMPLANT
COVER SURGICAL LIGHT HANDLE (MISCELLANEOUS) ×2 IMPLANT
COVER WAND RF STERILE (DRAPES) ×1 IMPLANT
DERMABOND ADVANCED (GAUZE/BANDAGES/DRESSINGS) ×2
DERMABOND ADVANCED .7 DNX12 (GAUZE/BANDAGES/DRESSINGS) ×2 IMPLANT
DRAIN CHANNEL 19F RND (DRAIN) ×4 IMPLANT
DRAPE HALF SHEET 40X57 (DRAPES) ×2 IMPLANT
DRAPE ORTHO SPLIT 77X108 STRL (DRAPES) ×4
DRAPE SURG 17X11 SM STRL (DRAPES) ×4 IMPLANT
DRAPE SURG ORHT 6 SPLT 77X108 (DRAPES) ×1 IMPLANT
DRESSING PREVENA PLUS CUSTOM (GAUZE/BANDAGES/DRESSINGS) IMPLANT
DRSG PAD ABDOMINAL 8X10 ST (GAUZE/BANDAGES/DRESSINGS) ×4 IMPLANT
DRSG PREVENA PLUS CUSTOM (GAUZE/BANDAGES/DRESSINGS) ×4
ELECT BLADE 6.5 EXT (BLADE) IMPLANT
ELECT CAUTERY BLADE 6.4 (BLADE) ×2 IMPLANT
ELECT REM PT RETURN 9FT ADLT (ELECTROSURGICAL) ×2
ELECTRODE REM PT RTRN 9FT ADLT (ELECTROSURGICAL) ×1 IMPLANT
EVACUATOR SILICONE 100CC (DRAIN) ×4 IMPLANT
GAUZE SPONGE 4X4 12PLY STRL (GAUZE/BANDAGES/DRESSINGS) ×4 IMPLANT
GAUZE XEROFORM 5X9 LF (GAUZE/BANDAGES/DRESSINGS) ×2 IMPLANT
GLOVE BIO SURGEON STRL SZ 6.5 (GLOVE) ×5 IMPLANT
GLOVE ECLIPSE 7.0 STRL STRAW (GLOVE) ×1 IMPLANT
GOWN STRL REUS W/ TWL LRG LVL3 (GOWN DISPOSABLE) ×3 IMPLANT
GOWN STRL REUS W/TWL LRG LVL3 (GOWN DISPOSABLE) ×6
KIT BASIN OR (CUSTOM PROCEDURE TRAY) ×2 IMPLANT
KIT TURNOVER KIT B (KITS) ×2 IMPLANT
MARKER SKIN DUAL TIP RULER LAB (MISCELLANEOUS) ×2 IMPLANT
NDL SPNL 18GX3.5 QUINCKE PK (NEEDLE) IMPLANT
NEEDLE SPNL 18GX3.5 QUINCKE PK (NEEDLE) ×2 IMPLANT
NS IRRIG 1000ML POUR BTL (IV SOLUTION) ×4 IMPLANT
PACK GENERAL/GYN (CUSTOM PROCEDURE TRAY) ×2 IMPLANT
PAD ARMBOARD 7.5X6 YLW CONV (MISCELLANEOUS) ×4 IMPLANT
SPECIMEN JAR LARGE (MISCELLANEOUS) ×2 IMPLANT
SPONGE LAP 18X18 RF (DISPOSABLE) ×8 IMPLANT
STAPLER INSORB 30 2030 C-SECTI (MISCELLANEOUS) ×2 IMPLANT
STAPLER VISISTAT 35W (STAPLE) ×1 IMPLANT
STRIP CLOSURE SKIN 1/2X4 (GAUZE/BANDAGES/DRESSINGS) IMPLANT
SUT ETHILON 2 0 FS 18 (SUTURE) IMPLANT
SUT ETHILON 3 0 PS 1 (SUTURE) ×2 IMPLANT
SUT MNCRL AB 3-0 PS2 18 (SUTURE) ×12 IMPLANT
SUT MNCRL AB 4-0 PS2 18 (SUTURE) ×8 IMPLANT
SUT SILK 3 0 SH 30 (SUTURE) ×4 IMPLANT
SUT VIC AB 2-0 CTX 27 (SUTURE) ×8 IMPLANT
SUT VIC AB 3-0 SH 18 (SUTURE) IMPLANT
SUT VLOC 90 P-14 23 (SUTURE) ×3 IMPLANT
TOWEL GREEN STERILE (TOWEL DISPOSABLE) ×4 IMPLANT
TOWEL GREEN STERILE FF (TOWEL DISPOSABLE) ×2 IMPLANT
TRAY FOLEY W/BAG SLVR 16FR (SET/KITS/TRAYS/PACK) ×2
TRAY FOLEY W/BAG SLVR 16FR ST (SET/KITS/TRAYS/PACK) ×1 IMPLANT
WATER STERILE IRR 1000ML POUR (IV SOLUTION) ×2 IMPLANT

## 2019-06-16 NOTE — Transfer of Care (Signed)
Immediate Anesthesia Transfer of Care Note  Patient: Denise Sharp  Procedure(s) Performed: PANNICULECTOMY (N/A Abdomen)  Patient Location: PACU  Anesthesia Type:General  Level of Consciousness: awake, alert  and oriented  Airway & Oxygen Therapy: Patient Spontanous Breathing, Patient connected to nasal cannula oxygen and Patient connected to face mask oxygen  Post-op Assessment: Report given to RN, Post -op Vital signs reviewed and stable and Patient moving all extremities X 4  Post vital signs: Reviewed and stable  Last Vitals:  Vitals Value Taken Time  BP 112/78 06/16/19 1459  Temp 36.8 C 06/16/19 1500  Pulse 73 06/16/19 1503  Resp 14 06/16/19 1503  SpO2 98 % 06/16/19 1503  Vitals shown include unvalidated device data.  Last Pain:  Vitals:   06/16/19 0940  PainSc: 0-No pain         Complications: No apparent anesthesia complications

## 2019-06-16 NOTE — Anesthesia Procedure Notes (Signed)
Procedure Name: Intubation Date/Time: 06/16/2019 11:22 AM Performed by: Orlie Dakin, CRNA Pre-anesthesia Checklist: Patient identified, Emergency Drugs available, Suction available and Patient being monitored Patient Re-evaluated:Patient Re-evaluated prior to induction Oxygen Delivery Method: Circle system utilized Preoxygenation: Pre-oxygenation with 100% oxygen Induction Type: IV induction Ventilation: Mask ventilation without difficulty and Oral airway inserted - appropriate to patient size Laryngoscope Size: Sabra Heck and 3 Grade View: Grade I Tube type: Oral Tube size: 7.0 mm Number of attempts: 1 Airway Equipment and Method: Stylet Placement Confirmation: ETT inserted through vocal cords under direct vision,  positive ETCO2 and breath sounds checked- equal and bilateral Secured at: 23 cm Tube secured with: Tape Dental Injury: Teeth and Oropharynx as per pre-operative assessment

## 2019-06-16 NOTE — Op Note (Signed)
Operative Note   DATE OF OPERATION: 06/16/2019  SURGICAL DEPARTMENT: Plastic Surgery  PREOPERATIVE DIAGNOSES: Abdominal pannus  POSTOPERATIVE DIAGNOSES:  same  PROCEDURE: Infraumbilical panniculectomy and application of incisional wound VAC.  SURGEON: Talmadge Coventry, MD  ASSISTANT: Verdie Shire, PA  ANESTHESIA:  General.   COMPLICATIONS: None.   INDICATIONS FOR PROCEDURE:  The patient, Denise Sharp is a 73 y.o. female born on Jan 04, 1946, is here for treatment of symptomatic abdominal pannus. MRN: [id number]  CONSENT:  Informed consent was obtained directly from the patient. Risks, benefits and alternatives were fully discussed. Specific risks including but not limited to bleeding, infection, hematoma, seroma, scarring, pain, contracture, asymmetry, wound healing problems, and need for further surgery were all discussed. The patient did have an ample opportunity to have questions answered to satisfaction.   DESCRIPTION OF PROCEDURE:  The patient was taken to the operating room. Vancomycin antibiotics were given.  General anesthesia was administered.  The patient's operative site was prepped and draped in a sterile fashion. A time out was performed and all information was confirmed to be correct.  I started by drawing the inferior incision.  This was about 8 cm superior to the vulvar commissure.  I then planned out the upper incision with marker.  Tumescent solution was then infiltrated that consisted of lidocaine epinephrine and normal saline.  I started by making the inferior incision with a 10 blade and it went down almost to the fascia with cautery.  I undermined superiorly and planned out the upper incision and made sure that my mark would not be too tight.  I then made the upper incision with a 10 blade and used cautery to dissect down almost to the fascia.  The pannus was then excised and weighed about 12 pounds.  Meticulous hemostasis was then obtained with cautery.  The  wound was then loosely stapled closed to make sure the contour was appropriate and Minor modifications were made.  219 French drains were placed coming out laterally and secured with a 3-0 nylon.  Closure was done with deep interrupted 2-0 Vicryl sutures in sorb stapler and a 3 oh V-Loc barbed suture.  Incisional wound VAC was then applied.  The advanced practice practitioner (APP) assisted throughout the case.  The APP was essential in retraction and counter traction when needed to make the case progress smoothly.  This retraction and assistance made it possible to see the tissue plans for the procedure.  The assistance was needed for blood control, tissue re-approximation and assisted with closure of the incision site.  The patient tolerated the procedure well.  There were no complications. The patient was allowed to wake from anesthesia, extubated and taken to the recovery room in satisfactory condition.

## 2019-06-16 NOTE — Brief Op Note (Signed)
06/16/2019  2:53 PM  PATIENT:  Denise Sharp  73 y.o. female  PRE-OPERATIVE DIAGNOSIS:  abdominal pannus  POST-OPERATIVE DIAGNOSIS:  abdominal pannus  PROCEDURE:  Procedure(s) with comments: PANNICULECTOMY (N/A) - 3 hours, please  SURGEON:  Surgeon(s) and Role:    * Tamina Cyphers, Steffanie Dunn, MD - Primary  PHYSICIAN ASSISTANT: Software engineer, PA  ASSISTANTS: none   ANESTHESIA:   general  EBL:  100  BLOOD ADMINISTERED:none  DRAINS: (2) Jackson-Pratt drain(s) with closed bulb suction in the abdomen   LOCAL MEDICATIONS USED:  BUPIVICAINE   SPECIMEN:  Source of Specimen:  abdominal pannus  DISPOSITION OF SPECIMEN:  PATHOLOGY  COUNTS:  YES  TOURNIQUET:  * No tourniquets in log *  DICTATION: .Dragon Dictation  PLAN OF CARE: Admit to inpatient   PATIENT DISPOSITION:  PACU - hemodynamically stable.   Delay start of Pharmacological VTE agent (>24hrs) due to surgical blood loss or risk of bleeding: not applicable

## 2019-06-16 NOTE — Anesthesia Postprocedure Evaluation (Signed)
Anesthesia Post Note  Patient: Denise Sharp  Procedure(s) Performed: PANNICULECTOMY (N/A Abdomen)     Patient location during evaluation: PACU Anesthesia Type: General Level of consciousness: awake and alert Pain management: pain level controlled Vital Signs Assessment: post-procedure vital signs reviewed and stable Respiratory status: spontaneous breathing, nonlabored ventilation, respiratory function stable and patient connected to nasal cannula oxygen Cardiovascular status: blood pressure returned to baseline and stable Postop Assessment: no apparent nausea or vomiting Anesthetic complications: no    Last Vitals:  Vitals:   06/16/19 1545 06/16/19 1600  BP: 133/65 131/68  Pulse: 76 73  Resp: 16 15  Temp:    SpO2: 95% 96%    Last Pain:  Vitals:   06/16/19 1612  PainSc: 4                  Ondrea Dow S

## 2019-06-16 NOTE — Interval H&P Note (Signed)
History and Physical Interval Note:  06/16/2019 10:29 AM  Denise Sharp  has presented today for surgery, with the diagnosis of abdominal pannus.  The various methods of treatment have been discussed with the patient and family. After consideration of risks, benefits and other options for treatment, the patient has consented to  Procedure(s) with comments: PANNICULECTOMY (N/A) - 3 hours, please as a surgical intervention.  The patient's history has been reviewed, patient examined, no change in status, stable for surgery.  I have reviewed the patient's chart and labs.  Questions were answered to the patient's satisfaction.     [name]

## 2019-06-17 ENCOUNTER — Encounter (HOSPITAL_COMMUNITY): Payer: Self-pay | Admitting: General Practice

## 2019-06-17 LAB — GLUCOSE, CAPILLARY
Glucose-Capillary: 120 mg/dL — ABNORMAL HIGH (ref 70–99)
Glucose-Capillary: 147 mg/dL — ABNORMAL HIGH (ref 70–99)
Glucose-Capillary: 131 mg/dL — ABNORMAL HIGH (ref 70–99)
Glucose-Capillary: 163 mg/dL — ABNORMAL HIGH (ref 70–99)

## 2019-06-17 NOTE — Progress Notes (Signed)
1 Day Post-Op  Subjective: Denise Sharp is POD1 after panniculectomy. She reports she is feeling well this AM, pain 2/10. Minimal abdominal pain with palpation. Prevena vac in place, no drainage in canister. Holding seal.  JP drains in place, serosanguinous in nature. Daily total yesterday 168 cc.  No fevers, chills, n/v. + BS, no BM or flatus yet. She did not want colace as she reports she will be fine without it. She has been using incentive spirometer. CPAP at bedside. She slept very well last night.   She reports her husband is in the hospital with diverticulitis at the [place].   Patient with abnormal rhythm on auscultation, she is asymptomatic, recent ekg normal, recent stress test normal.   Objective: Vital signs in last 24 hours: Temp:  [98.1 F (36.7 C)-99.4 F (37.4 C)] 99.1 F (37.3 C) (12/02 0843) Pulse Rate:  [68-95] 95 (12/02 0843) Resp:  [12-20] 20 (12/02 0843) BP: (98-144)/(46-88) 102/52 (12/02 0843) SpO2:  [93 %-100 %] 97 % (12/02 0843) Weight:  [113.9 kg] 113.9 kg (12/01 0940) Last BM Date: 06/15/19  Intake/Output from previous day: 12/01 0701 - 12/02 0700 In: 2220 [P.O.:220; I.V.:1500; IV Piggyback:500] Out: 593 [Urine:300; Drains:168; Blood:125] Intake/Output this shift: No intake/output data recorded.  General appearance: alert, cooperative, no distress and resting in bed Head: Normocephalic, without obvious abnormality, atraumatic Resp: clear to auscultation bilaterally Chest wall: no tenderness Cardio: regular rate, irregular rhythm. GI: soft, some swelling, minimal ttp, + BS, no guarding, no masses. Extremities: extremities normal, atraumatic, no cyanosis or edema and SCDs in place, b/l knee replacement scarring noted  Pulses: 2+ and symmetric Neurologic: Grossly normal Incision/Wound: Wound vac in place, transverse panniculectomy incision. No drainage in canister. JP drains in place b/l, serosanguinous drainage in place  Lab Results:  CBC     Component Value Date/Time   WBC 10.9 (H) 06/09/2019 1100   RBC 4.34 06/09/2019 1100   HGB 11.8 (L) 06/09/2019 1100   HGB 13.1 06/21/2014 1349   HCT 38.4 06/09/2019 1100   HCT 41.4 06/21/2014 1349   PLT 336 06/09/2019 1100   PLT 308 06/21/2014 1349   MCV 88.5 06/09/2019 1100   MCV 88 06/21/2014 1349   MCH 27.2 06/09/2019 1100   MCHC 30.7 06/09/2019 1100   RDW 17.1 (H) 06/09/2019 1100   RDW 15.4 (H) 06/21/2014 1349   LYMPHSABS 2.3 01/01/2019 1345   LYMPHSABS 2.3 02/25/2014 2153   MONOABS 0.4 01/01/2019 1345   MONOABS 0.6 02/25/2014 2153   EOSABS 0.1 01/01/2019 1345   EOSABS 0.1 02/25/2014 2153   BASOSABS 0.1 01/01/2019 1345   BASOSABS 0.1 02/25/2014 2153    BMET No results for input(s): NA, K, CL, CO2, GLUCOSE, BUN, CREATININE, CALCIUM in the last 72 hours. PT/INR No results for input(s): LABPROT, INR in the last 72 hours. ABG No results for input(s): PHART, HCO3 in the last 72 hours.  Invalid input(s): PCO2, PO2  Studies/Results: No results found.  Anti-infectives: Anti-infectives (From admission, onward)   Start     Dose/Rate Route Frequency Ordered Stop   06/16/19 0900  vancomycin (VANCOCIN) 1,500 mg in sodium chloride 0.9 % 500 mL IVPB     1,500 mg 250 mL/hr over 120 Minutes Intravenous To ShortStay Surgical 06/15/19 1108 06/16/19 1207      Assessment/Plan: s/p Procedure(s): PANNICULECTOMY  [name] is doing well. She reports she feels well overall. Pain adequately controlled. No sob, cp, fever, chills, nausea, vomiting, dizziness.  BP soft, asymptomatic. Monitor. Increase PO fluid  in take. LMWH for DVT ppx. SCDs in place.  Await PT consult for evaluation. Continue use of incentive spirometer.  Possible d/c today pending PT eval.   Recommend follow up with PCP/Cardio for eval of irregular rhythm, asymptomatic, recent work up negative. Including stress test and normal ekg. Rhythm is not consistent with afib, based on exam.   Call with questions or  concerns.   LOS: 1 day    Charlies Constable, PA-C 06/17/2019

## 2019-06-17 NOTE — Progress Notes (Signed)
MEWS Guidelines - (patients age 73 and over)  Pt changed to yellow MEWS for SBP. Dr Claudia Desanctis paged.  Red - At High Risk for Deterioration Yellow - At risk for Deterioration  1. Go to room and assess patient 2. Validate data. Is this patient's baseline? If data confirmed: 3. Is this an acute change? 4. Administer prn meds/treatments as ordered. 5. Note Sepsis score 6. Review goals of care 7. Sports coach, RRT nurse and Provider. 8. Ask Provider to come to bedside.  9. Document patient condition/interventions/response. 10. Increase frequency of vital signs and focused assessments to at least q15 minutes x 4, then q30 minutes x2. - If stable, then q1h x3, then q4h x3 and then q8h or dept. routine. - If unstable, contact Provider & RRT nurse. Prepare for possible transfer. 11. Add entry in progress notes using the smart phrase ".MEWS". 1. Go to room and assess patient 2. Validate data. Is this patient's baseline? If data confirmed: 3. Is this an acute change? 4. Administer prn meds/treatments as ordered? 5. Note Sepsis score 6. Review goals of care 7. Sports coach and Provider 8. Call RRT nurse as needed. 9. Document patient condition/interventions/response. 10. Increase frequency of vital signs and focused assessments to at least q2h x2. - If stable, then q4h x2 and then q8h or dept. routine. - If unstable, contact Provider & RRT nurse. Prepare for possible transfer. 11. Add entry in progress notes using the smart phrase ".MEWS".  Green - Likely stable Lavender - Comfort Care Only  1. Continue routine/ordered monitoring.  2. Review goals of care. 1. Continue routine/ordered monitoring. 2. Review goals of care.

## 2019-06-17 NOTE — Evaluation (Signed)
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  Clinical Impression   Patient is s/p above surgery resulting in functional limitations due to the deficits listed below (see PT Problem List). Prior to admission, Ms. Borntreger was managing walking short household distances; she was dependent on her husband assist for toileting, lower body ADLs; Before the pandemic, she and her husband got out in the community more, and she reports general deconditioning since they have not gotten out in recent months; Presents to PT with generalized weakness, abdominal soreness, decr trunk ROM, general deconditioning;  Patient will benefit from skilled PT to increase their independence and safety with mobility to allow discharge to the venue listed below.    Her husband is currently in the hospital, and this puts quite a wrench into her ability to safely dc home because she is dependent on him for ADLs; She will need 24 our reliable assistance when she gets home, and she tells me any help she can get from her adult children would be intermittent; Given this, we must consider SNF care for post op care and for rehab  to maximize independence and safety with mobility prior to dc home; She prefers to dc home (she did not consider SNF during our session), and hopes to be able to arrange for home health aides, nursing care, and therapies;   I noted that she is a [facility] ACO client -- is the [place] [facility] available to her?     Follow Up Recommendations SNF;Home health PT;Supervision/Assistance - 24 hour;Other (comment)(See discussion above)    Equipment Recommendations  Other (comment)(Bari Rollator RW)    Recommendations for Other Services OT consult(ordered per protocol)     Precautions / Restrictions Precautions Precautions: Fall Precaution Comments: JP drain, VAC      Mobility  Bed Mobility Overal bed mobility: Needs Assistance Bed Mobility: Supine to Sit     Supine to sit: Mod assist;HOB elevated     General bed mobility comments: Moderat handheld assist to pull to sit  Transfers Overall transfer level: Needs assistance Equipment used: Rolling walker (2 wheeled) Transfers: Sit to/from [facility] Sit to Stand: Min assist Stand pivot transfers: Min assist       General transfer comment: Min assist for safety, steady and guard; stood from bed and the took pivot steps to [facility]  Ambulation/Gait Ambulation/Gait assistance: Min guard Gait Distance (Feet): 20 Feet Assistive device: Rolling walker (2 wheeled) Gait Pattern/deviations: Decreased step length - right;Decreased step length - left     General Gait Details: Cues to self-monitor for activity tolerance; good use of RW for steadiness; able to walk near her typical distance  Stairs            Wheelchair Mobility    Modified Rankin (Stroke Patients Only)       Balance Overall balance assessment: Needs assistance Sitting-balance support: Feet supported Sitting  balance-Leahy Scale: Fair     Standing balance support: During functional activity Standing balance-Leahy Scale: Poor(Near Fair)                                Pertinent Vitals/Pain Pain Assessment: 0-10 Pain Score: 2  Pain Location: lower abdomen were stitches are Pain Descriptors / Indicators: Aching Pain Intervention(s): Monitored during session    Home Living Family/patient expects to be discharged to:: Private residence Living Arrangements: Spouse/significant other Available Help at Discharge: Other (Comment)(Her husband is currently in the hospital) Type of Home: House Home Access: [place]: [place]: [place] - 2 wheels;Shower seat;Grab bars - toilet;Grab bars - tub/shower Additional Comments: Has an adjustable bed    Prior Function Level of Independence: Needs assistance   Gait / Transfers Assistance Needed: short, household distances; furniture walks  ADL's / Homemaking Assistance Needed: Assist from husband for lower body ADLs including toileting        Hand Dominance   Dominant Hand: Right    Extremity/Trunk Assessment   Upper Extremity Assessment Upper Extremity Assessment: Generalized weakness    Lower Extremity Assessment Lower Extremity Assessment: Generalized weakness    Cervical / Trunk Assessment Cervical / Trunk Assessment: Other exceptions Cervical / Trunk Exceptions: limited by boday habitus and soreness post surgery; REports history of spine disease, which limits her activity tolerance  Communication      Cognition Arousal/Alertness: Awake/alert Behavior During Therapy: WFL for tasks assessed/performed Overall Cognitive Status: Within Functional Limits for tasks assessed                                        General Comments      Exercises     Assessment/Plan    PT Assessment Patient needs continued PT services  PT Problem List Decreased strength;Decreased range of motion;Decreased activity tolerance;Decreased balance;Decreased mobility;Decreased knowledge of use of DME;Decreased knowledge of  precautions;Obesity;Decreased skin integrity;Pain       PT Treatment Interventions DME instruction;Gait training;Functional mobility training;Therapeutic activities;Therapeutic exercise;Balance training;Patient/family education;Neuromuscular re-education    PT Goals (Current goals can be found in the Care Plan section)  Acute Rehab PT Goals Patient Stated Goal: Hopes to be able to arrange for 24 hour assist so that she can dc home PT Goal Formulation: With patient Time For Goal Achievement: 07/01/19 Potential to Achieve Goals: Good    Frequency Min 3X/week   Barriers to discharge Decreased caregiver support Ms. Lave tells me that she is dependent on her husband for assistance with ADLs in particular, and he is currently in the hospital; unknown when he will be home again    Co-evaluation               AM-PAC PT "6 Clicks" Mobility  Outcome Measure Help needed turning from your back to your side while in a flat bed without using bedrails?: A Little Help needed moving from lying on your back to sitting on the side of a flat bed without using bedrails?: A Lot Help needed moving to and from a bed to a chair (including a wheelchair)?: A Little Help needed standing up from a chair using your arms (e.g., wheelchair or bedside chair)?: A Little Help needed to walk in hospital room?: A Little Help needed climbing 3-5 steps with a railing? : Total  6 Click Score: 15    End of Session   Activity Tolerance: Patient tolerated treatment well Patient left: in chair;with call bell/phone within reach Nurse Communication: Mobility status PT Visit Diagnosis: Other abnormalities of gait and mobility (R26.89);Muscle weakness (generalized) (M62.81);Pain Pain - part of body: (Lower abdomen)    Time: 3710-6269 PT Time Calculation (min) (ACUTE ONLY): 39 min   Charges:   PT Evaluation $PT Eval Moderate Complexity: 1 Mod PT Treatments $Gait Training: 8-22 mins $Therapeutic Activity:  8-22 mins        Roney Marion, PT  Acute Rehabilitation Services Pager [phone] Office [place] 06/17/2019, 2:37 PM

## 2019-06-17 NOTE — Progress Notes (Signed)
Patient is home unit CPAP at beside. Patient needs no assistance with her machine.

## 2019-06-18 LAB — GLUCOSE, CAPILLARY
Glucose-Capillary: 279 mg/dL — ABNORMAL HIGH (ref 70–99)
Glucose-Capillary: 136 mg/dL — ABNORMAL HIGH (ref 70–99)

## 2019-06-18 LAB — SURGICAL PATHOLOGY

## 2019-06-18 NOTE — Progress Notes (Signed)
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    PT Comments    Pt in recliner upon arrival stating she was just waiting for her daughter to get her to take her home. Pt refusing therex and OOB mobility secondary to going home this afternoon and the effort that will take. Pt has several concerns for returning home. Daughter able to provide intermittent assist. pts husband is primary caregiver but he is currently in hospital and plan for d/c tomorrow but will be very limited in amount of assist he can provide. pt educated on trying to get more family and friends to come provide assistance. Pt is extremely worried about spending multiple hours at home alone since she needs assist for everything and cannot get out of bed by herself. Pt educated on buying bed rails for her bed at home in order to improve independence with bed mobility with pt stating she had looked into some but they would not work for her specific mattress. Pt reports having lift chair in living room and bed that can elevate head/foot. Pt educated on proper technique for safe car transfers with pt verbalizing understanding. Pt  educated on short term rehab stay to further progress mobility, allow for 24/7 assist and decrease caregiver burden on husband and daughter. Pt does not seem interested in that option despite being so worried about going home. Pt reports she is willing to pay out of pocket for more nursing assistance than will be provided with home health. Pt really would benefit from STR to improve deficits, progress safe functional mobility, decrease fall risk and caregiver burden.    Follow Up Recommendations  SNF;Home health PT;Supervision/Assistance - 24 hour;Other (comment)(pts husband is primary caregiver and he has been in the hospital himself this week, daughter available PRN)     Equipment Recommendations  None recommended by PT    Recommendations for Other Services       Precautions / Restrictions Precautions Precautions: Fall Precaution Comments: JP drain, VAC Restrictions Weight Bearing Restrictions: No    Mobility  Bed Mobility               General bed mobility comments: deferred, pt up in recliner upon arrival  Transfers                 General transfer comment: pt refusing due to d/c home this afternoon  Ambulation/Gait             General Gait Details: pt refusing due to d/c home today   Stairs             Wheelchair Mobility    Modified Rankin (Stroke Patients Only)       Balance  Cognition Arousal/Alertness: Awake/alert Behavior During Therapy: WFL for tasks assessed/performed;Anxious Overall Cognitive Status: Within Functional Limits for tasks assessed                                        Exercises      General Comments        Pertinent Vitals/Pain Pain Score: 2  Pain Location: lower abdomen were stitches are Pain Descriptors / Indicators: Aching;Sore;Operative site guarding;Discomfort Pain Intervention(s): Monitored during session    Home Living                       Prior Function            PT Goals (current goals can now be found in the care plan section) Progress towards PT goals: Progressing toward goals    Frequency    Min 3X/week      PT Plan Current plan remains appropriate    Co-evaluation              AM-PAC PT "6 Clicks" Mobility   Outcome Measure  Help needed turning from your back to your side while in a flat bed without using bedrails?: A Little Help needed moving from lying on your back to sitting on the side of a flat bed without using bedrails?: A Lot Help needed moving to and from a bed to a chair (including a wheelchair)?: A Little Help needed standing up from a chair using your arms (e.g., wheelchair or bedside chair)?: A Little Help needed to walk in hospital room?: A Little Help needed climbing 3-5 steps with a railing? : Total 6 Click Score: 15    End of Session   Activity Tolerance: Patient tolerated treatment well Patient left: in chair;with call bell/phone within reach Nurse Communication: Mobility status PT Visit Diagnosis: Other abnormalities of gait and mobility (R26.89);Muscle weakness (generalized) (M62.81);Pain Pain - part of body: (lower abdomen)     Time: 5830-9407 PT Time Calculation (min) (ACUTE ONLY): 23 min  Charges:  $Self Care/Home Management: 8-22                     Zachary George PT, DPT 1:34 PM,06/18/19    Veanna Dower Drucilla Chalet 06/18/2019, 1:28 PM

## 2019-06-18 NOTE — Progress Notes (Signed)
Patient discharged to home. Patient and daughter verbalized understanding of all discharge instructions including incision care, discharge medications and follow up MD visits. Patient discharged home with Prevena wound vac in place and bilateral JP drains. Patient left unit with volunteer services via wheelchair.

## 2019-06-18 NOTE — Evaluation (Signed)
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   Clinical Impression   Pt admitted for surgery. Pt currently with functional limitations due to the deficits listed below (see OT Problem List).  Pt will benefit from skilled OT to increase their safety and independence with ADL and functional mobility for ADL to facilitate discharge to venue listed below.     Follow Up Recommendations  Home health OT;Supervision/Assistance - 24 hour    Equipment Recommendations  3 in 1 bedside commode    Recommendations for Other Services       Precautions / Restrictions Precautions Precautions: Fall Precaution Comments: JP drain, VAC Restrictions Weight Bearing Restrictions: No      Mobility Bed Mobility Overal bed mobility: Needs Assistance Bed Mobility: Supine to Sit     Supine to sit: Mod assist;HOB elevated     General bed mobility comments: Moderat handheld assist to pull to sit  Transfers Overall transfer level: Needs assistance Equipment used: Rolling walker (2 wheeled) Transfers: Sit to/from [facility] Sit to Stand: Min assist Stand pivot transfers:  Min assist       General transfer comment: Min assist for safety, steady and guard; stood from bed and the took pivot steps to [facility]    Balance Overall balance assessment: Needs assistance Sitting-balance support: Feet supported Sitting balance-Leahy Scale: Fair     Standing balance support: During functional activity Standing balance-Leahy Scale: Poor(Near Fair)                             ADL either performed or assessed with clinical judgement   ADL Overall ADL's : Needs assistance/impaired Eating/Feeding: Set up;Sitting   Grooming: Set up;Sitting   Upper Body Bathing: Minimal assistance   Lower Body Bathing: Maximal assistance;Sit to/from stand;Cueing for safety;Cueing for sequencing;With adaptive equipment   Upper Body Dressing : Set up;Sitting   Lower Body Dressing: Maximal assistance;Sit to/from stand;Cueing for safety;Cueing for sequencing;With adaptive equipment   Toilet Transfer: Minimal assistance;Cueing for safety;RW;Stand-pivot;Cueing for sequencing   Toileting- Clothing Manipulation and Hygiene: Maximal assistance;Sit to/from stand;Cueing for safety;Cueing for sequencing Toileting - Clothing Manipulation Details (indicate cue type and reason): pt has AE and toilet aide       General ADL Comments: pt plans on hiring A for ADL activity while she is healing. Pt haS AE as well as toilet aide. Pt also has a lift chair and an adjustable bed.     Vision Patient Visual Report: No change from baseline              Pertinent Vitals/Pain Pain Score: 4  Pain Location: lower abdomen were stitches are Pain Descriptors / Indicators: Aching;Sore;Operative site guarding;Discomfort Pain Intervention(s): Limited activity within patient's tolerance     Hand Dominance Right   Extremity/Trunk Assessment Upper Extremity Assessment Upper Extremity Assessment: Generalized weakness  Cervical / Trunk Assessment Cervical / Trunk Assessment: Other  exceptions Cervical / Trunk Exceptions: limited by boday habitus and soreness post surgery; REports history of spine disease, which limits her activity tolerance   Communication     Cognition Arousal/Alertness: Awake/alert Behavior During Therapy: WFL for tasks assessed/performed;Anxious Overall Cognitive Status: Within Functional Limits for tasks assessed                                                Home Living Family/patient expects to be discharged to:: Private residence Living Arrangements: Spouse/significant other  Type of Home: House Home Access: Ramped entrance     Home Layout: One level     Bathroom Shower/Tub: Walk-in shower(and they have renovated to walk in tub)   Bathroom Toilet: Handicapped height Bathroom Accessibility: Yes   Home Equipment: Environmental consultant - 2 wheels;Shower seat;Grab bars - toilet;Grab bars - tub/shower   Additional Comments: Has an adjustable bed      Prior Functioning/Environment Level of Independence: Needs assistance  Gait / Transfers Assistance Needed: short, household distances; furniture walks ADL's / Homemaking Assistance Needed: Assist from husband for lower body ADLs including toileting            OT Problem List: Decreased strength;Obesity      OT Treatment/Interventions:      OT Goals(Current goals can be found in the care plan section) Acute Rehab OT Goals Patient Stated Goal: Hopes to be able to arrange for 24 hour assist so that she can dc home OT Goal Formulation: With patient  OT Frequency:     Barriers to D/C:               AM-PAC OT "6 Clicks" Daily Activity     Outcome Measure Help from another person eating meals?: None   Help from another person toileting, which includes using toliet, bedpan, or urinal?: A Lot Help from another person bathing (including washing, rinsing, drying)?: A Lot Help from another person to put on and taking off regular upper body clothing?: A Little Help from  another person to put on and taking off regular lower body clothing?: A Lot 6 Click Score: 13   End of Session Equipment Utilized During Treatment: Rolling walker Nurse Communication: Mobility status  Activity Tolerance: Patient tolerated treatment well Patient left: in chair  OT Visit Diagnosis: Muscle weakness (generalized) (M62.81)                Time: 0102-7253 OT Time Calculation (min): 24 min Charges:  OT General Charges $OT Visit: 1 Visit OT Evaluation $OT Eval Moderate Complexity: 1 Mod OT Treatments $Self Care/Home Management : 8-22 mins  Kari Baars, OT Acute Rehabilitation Services [phone] Office- [phone]     Domique Clapper, Edwena Felty D 06/18/2019, 2:54 PM

## 2019-06-18 NOTE — TOC Initial Note (Signed)
Transition of Care [facility]) - Initial/Assessment Note    Patient Details  Name: Denise Sharp MRN: [id number] Date of Birth: 09-11-1945  Transition of Care [facility]) CM/SW Contact:    Marilu Favre, RN Phone Number: 06/18/2019, 10:43 AM  Clinical Narrative:            Spoke to Dr Claudia Desanctis plan is to discharge to home today with home health services.      Confirmed face sheet information with patient. Patient wanting NCM to arrange 24 hour care at home .Her husband is currently in [place].  Explained NCM can arrange home health RN,PT,OT,sw but 24 hour care is up to patient/family to arrange.Patient upset and wanting to go to [facility] for 6 weeks. Patient did consent for NCM to call her daughter Drue Dun to discuss discharge planning.   Spoke to [place] . Drue Dun wants her mother to come home with home health through [facility]. Kristie's father will be released from [facility] hospital tomorrow. Drue Dun has this afternoon off and tomorrow off. She plans to come to the hospital today and take her mother home. She is requesting Tiffany with [facility] call her directly. Tiffany notified and will call Drue Dun.   Went back and talked to patient . Patient has talked with Drue Dun and is now in agreement to go home with [facility]. She has a walker at home. Requesting 3 in1 same ordered with Zack with [facility] and requested delivery by 1300.   Called Dr Claudia Desanctis office back at 0900 . Awaiting call back.   Bedside nurse to get a prevena VAC from OR  Expected Discharge Plan: [place] Barriers to Discharge: No Barriers Identified   Patient Goals and CMS Choice Patient states their goals for this hospitalization and ongoing recovery are:: to go home CMS Medicare.gov Compare Post Acute Care list provided to:: Patient Choice offered to / list presented to : Patient  Expected Discharge Plan and Services Expected Discharge Plan: [place]  Choice: Home Health, Durable Medical Equipment Living arrangements for the past 2 months: Single Family Home                 DME Arranged: 3-N-1 DME Agency: [employer] Date DME Agency Contacted: 06/18/19 Time DME Agency Contacted: [phone] Representative spoke with at DME Agency: [place] Arranged: RN, PT, OT, Social Work [place] Date [place]: 06/18/19 Time [place]: [id number] Representative spoke with at [place]: [place] Arrangements/Services Living arrangements for the past 2 months: [place] with:: Spouse Patient language and need for interpreter reviewed:: Yes Do you feel safe going back to the place where you live?: Yes      Need for Family Participation in Patient Care: Yes (Comment) Care giver support system in place?: Yes (comment) Current home services: DME Criminal Activity/Legal Involvement Pertinent to Current Situation/Hospitalization: No - Comment as needed  Activities of Daily Living Home Assistive Devices/Equipment: Environmental consultant (specify type), Cane (specify quad or straight), Other (Comment)(scooter) ADL Screening (condition at time of admission) Patient's cognitive ability adequate to safely complete daily activities?: Yes Is the patient deaf or have difficulty hearing?: No Does the patient have difficulty seeing, even when wearing glasses/contacts?: No Does the patient have difficulty concentrating, remembering, or making decisions?: No Patient able to express need for assistance with ADLs?: Yes Does the patient have difficulty dressing or bathing?: No Independently performs ADLs?: Yes (  appropriate for developmental age) Communication: Independent Dressing (OT): Needs assistance Is this a change from baseline?: Pre-admission baseline Grooming: Independent Is this a change from baseline?: Pre-admission baseline Feeding: Independent Bathing: Needs assistance Is this a change from baseline?:  Pre-admission baseline Toileting: Independent In/Out Bed: Needs assistance Is this a change from baseline?: Pre-admission baseline Walks in Home: Independent with device (comment) Does the patient have difficulty walking or climbing stairs?: Yes Weakness of Legs: Both Weakness of Arms/Hands: None  Permission Sought/Granted   Permission granted to share information with : Yes, Verbal Permission Granted  Share Information with NAME: daughter Drue Dun           Emotional Assessment Appearance:: Appears stated age     Orientation: : Oriented to Place, Oriented to Self, Oriented to  Time, Oriented to Situation Alcohol / Substance Use: Not Applicable Psych Involvement: No (comment)  Admission diagnosis:  abdominal pannus Patient Active Problem List   Diagnosis Date Noted  . Abdominal pannus 06/16/2019  . Major depressive disorder with current active episode 05/30/2019  . Exposure to COVID-19 virus 12/25/2018  . Functional diarrhea 10/22/2018  . Preoperative clearance 08/23/2018  . Bilateral cataracts 06/26/2018  . Status post bariatric surgery 02/05/2018  . GAD (generalized anxiety disorder) 02/01/2018  . Rectocele 01/24/2018  . Vaginal prolapse 01/04/2018  . Hepatic steatosis 01/04/2018  . Microalbuminuria due to type 2 diabetes mellitus ([place]) 11/21/2017  . Hospital discharge follow-up 09/22/2017  . Hypocalcemia 09/22/2017  . Fecal incontinence 09/22/2017  . B12 deficiency 09/22/2017  . Rotator cuff arthropathy, right 08/13/2017  . Primary localized osteoarthrosis of shoulder 08/13/2017  . Encounter for preoperative examination for general surgical procedure 08/03/2017  . 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 Candidiasis, intertrigo 08/03/2017  . 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 Atypical chest pain 10/16/2016  . Edema 12/13/2015  . Hypersomnia, persistent 06/18/2015  . Mechanical breakdown of implanted  electronic neurostimulator of peripheral nerve ([place]) 10/16/2014  . Obesity hypoventilation syndrome ([place]) 08/13/2014  . Frequent falls 06/23/2014  . Chronic cough 05/05/2014  . Adenoma of left adrenal gland 03/24/2014  . Cervicogenic headache 03/23/2014  . Syncope and collapse 03/12/2014  . Vitamin D deficiency 01/06/2014  . Pannus, abdominal 12/23/2013  . Weight gain 12/03/2013  . Morbid obesity ([place]) 08/25/2013  . Diaphragmatic hernia 04/06/2013  . Diabetic polyneuropathy ([place]) 03/25/2013  . Abnormality of gait 03/25/2013  . Multiple pulmonary nodules 03/20/2013  . DOE (dyspnea on exertion) 03/20/2013  . Hypomagnesemia 08/06/2012  . Anemia 08/06/2012  . Iron deficiency anemia 08/06/2012  . Functional disorder of bladder 07/15/2012  . Incomplete bladder emptying 07/15/2012  . 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 Urge incontinence 07/15/2012  . Increased frequency of urination 07/15/2012  . OSA on CPAP 03/02/2012  . Insomnia secondary to anxiety 02/29/2012  . Sciatica 09/05/2011  . Cervical stenosis of spinal canal 06/14/2011  . Restless legs syndrome 03/13/2011  . Degenerative disk disease 03/13/2011  . Essential hypertension 03/12/2011  . Diabetes mellitus type II, controlled ([place]) 03/12/2011  . Hearing loss, right 03/12/2011   PCP:  [name], MD Pharmacy:   [id number] - [place], [place] [address] [place] [address] Phone: [phone] Fax: [phone]     Social Determinants of Health (SDOH) Interventions    Readmission Risk Interventions No flowsheet data found.

## 2019-06-18 NOTE — Progress Notes (Signed)
2 Days Post-Op  Subjective: Denise Sharp reports she is doing well. She has no pain at rest, only reporting pain with ambulation/transfers.  She reports a scratchy throat today and has a slight cough, no sputum production.  No shortness of breath, chest pain, palpitations, dizziness.  JP drains in place, serosanguineous output.  Wound VAC in place, good seal noted, no drainage in canister, tenderness over incision, no abdominal tenderness.  PT recommends SNF or Home Health assistance for Denise Sharp, she agrees with needing assistance at home. Her primary caregiver is her husband, who is currently in the hospital at the [facility] for diverticulitis, she is unsure when he will be home and able to assist her with recovery.  She reports her daughter is unable to assist her because she is busy caring for the father.  Denise Sharp has set up home health with [facility], but she does not have enough coverage for full assistance.   Temp last night of 100.9, BP stable, HR stable. Temp normalized this morning, she does not recall having a temp or did not have any symptoms.  She has not had any chills, nausea, vomiting.  She reports she is constipated, but does not feel bloated.  She reports a history of many bowel movements per day, up to 75 per Denise Sharp due to IBS.  She reports last night she had a lot of urgency, she reports that she has a bladder stimulator, she is unsure if it is working at this time.  She reports that in the past she can go 12 hours without urinating because of the stimulator.  She did not bring the external device with her.  Objective: Vital signs in last 24 hours: Temp:  [98.3 F (36.8 C)-100.9 F (38.3 C)] 98.3 F (36.8 C) (12/03 0517) Pulse Rate:  [83-95] 91 (12/03 0517) Resp:  [15-20] 15 (12/03 0517) BP: (94-133)/(45-67) 130/60 (12/03 0517) SpO2:  [93 %-98 %] 93 % (12/03 0517) Last BM Date: 06/15/19  Intake/Output from previous day: 12/02 0701 - 12/03 0700 In: 950 [P.O.:950] Out:  705 [Urine:500; Drains:205] Intake/Output this shift: No intake/output data recorded.  General appearance: alert, cooperative, no distress and Resting in bed, morbidly obese Resp: Unlabored, symmetric rise and fall GI: Soft, minimal tenderness, positive bowel sounds, no distention.  Extremities: extremities normal, atraumatic, no cyanosis or edema and Bilateral vertical knee replacement incisions, 2+ DP, SCDs in place Pulses: 2+ and symmetric Neurologic: Grossly normal Incision/Wound: Wound VAC in place along the transverse panniculectomy incision, good seal, no peri-VAC drainage noted, JP drains in place along left lateral and right lateral transverse incision, serosanguineous fluid and drains.  Lab Results:  CBC No results  BMET No results for input(s): NA, K, CL, CO2, GLUCOSE, BUN, CREATININE, CALCIUM in the last 72 hours. PT/INR No results for input(s): LABPROT, INR in the last 72 hours. ABG No results for input(s): PHART, HCO3 in the last 72 hours.  Invalid input(s): PCO2, PO2  Studies/Results: No results found.  Anti-infectives: Anti-infectives (From admission, onward)   Start     Dose/Rate Route Frequency Ordered Stop   06/16/19 0900  vancomycin (VANCOCIN) 1,500 mg in sodium chloride 0.9 % 500 mL IVPB     1,500 mg 250 mL/hr over 120 Minutes Intravenous To [facility] 06/15/19 1108 06/16/19 1207      Assessment/Plan: s/p Procedure(s): PANNICULECTOMY  Overall Denise Sharp reports she is doing well.  Her BP has improved.  She had a T-max last night of 100.9. This am, normal temp. She  is worried about assistance at home, her primary caregiver is her husband who is currently hospitalized for diverticulitis.  She has no one else who would be able to assist her at home. [facility]oke with [facility], who is going to have her receive PT, OT, RN support and SW. Daughter is going to have non medical support that will assist her with daily activities.   She reports  some constipation, recommend Colace today. Increase water intake.  She reports that she typically takes Lunesta at night to assist with sleeping, will check on this in order for tonight if Denise Sharp is to stay.  Continue with heparin every 8 hours for DVT prophylaxis.  SCDs. OOB with assistance.   Respiratory therapy assisted Denise Sharp with inhaler this morning.  Continue use of incentive spirometer.  Continue with PT. Appreciate assistance.  Possible d/c today or tomorrow.    LOS: 2 days    Charlies Constable, PA-C 06/18/2019

## 2019-06-19 ENCOUNTER — Telehealth: Payer: Self-pay | Admitting: Internal Medicine

## 2019-06-19 NOTE — Discharge Summary (Signed)
Physician Discharge Summary  Patient ID: Denise Sharp MRN: [id number] DOB/AGE: 1945-07-26 73 y.o.  Admit date: 06/16/2019 Discharge date: 06/19/2019  Admission Diagnoses: Abdominal pannus  Discharge Diagnoses:  Active Problems:   Abdominal pannus   Discharged Condition: good  Hospital Course: Mrs. Son is a 73 year old female who presented to the [facility] main OR for panniculectomy on 06/16/2019.  Her hospital course was medically uncomplicated. She had a soft BP post-operatively, but this quickly improved. No other concerns other than mild pain with activity/ambulation. Patient with regularly irregular heart sounds on auscultation, asymptomatic, denies dizziness, cp, sob, fatigue, weakness, palpitations. She is going to follow up with her PCP or Cardiology.  PT, OT, case manager for assistance with discharge planning.  Patient discharged on 06/18/2019 with plan to receive PT, OT, RN, hired help for assistance at home.  Her daughter was with her at discharge who is going to assist with taking care of her.  Her husband is currently in the hospital and is to be discharged on 06/19/2019.   Consults: None  Significant Diagnostic Studies: labs: Glucose  Treatments: IV hydration, antibiotics: vancomycin, respiratory therapy: home meds, surgery: panniculectomy  Discharge Exam: Blood pressure 130/60, pulse 91, temperature 98.3 F (36.8 C), temperature source Oral, resp. rate 15, height 5' 4.5" (1.638 m), weight 113.9 kg, SpO2 94 %.   General: alert, cooperative, no distress, resting in bed, morbidly obese. Resp: Unlabored, symmetric rise and fall GI: soft, minimal tenderness, + BS, no distention Extremities: normal, atraumatic, no edema or cyanosis. SCDs in place. 2+ DP pulses. Neuro: grossly normal Incision/wound: prevena vac in place - good seal. No drainage in canister. No peri-vac erythema, drainage noted. JP drains in place - serosanguinous fluid in bulbs. Mild TTP along  incision.   Disposition:   Discharge Instructions    Call MD for:  difficulty breathing, headache or visual disturbances   Complete by: As directed    Call MD for:  extreme fatigue   Complete by: As directed    Call MD for:  hives   Complete by: As directed    Call MD for:  persistant dizziness or light-headedness   Complete by: As directed    Call MD for:  persistant nausea and vomiting   Complete by: As directed    Call MD for:  redness, tenderness, or signs of infection (pain, swelling, redness, odor or green/yellow discharge around incision site)   Complete by: As directed    Call MD for:  severe uncontrolled pain   Complete by: As directed    Call MD for:  temperature >100.4   Complete by: As directed    Diet - low sodium heart healthy   Complete by: As directed    Increase activity slowly   Complete by: As directed      Allergies as of 06/18/2019      Reactions   Biaxin [clarithromycin] Nausea And Vomiting, Other (See Comments)   Severe irritable bowel   Demeclocycline Hives   Erythromycin Nausea And Vomiting, Other (See Comments)   Severe irritable bowel   Flagyl [metronidazole] Nausea And Vomiting, Other (See Comments)   Severe irritable bowel   Glucophage [metformin Hcl] Nausea And Vomiting, Other (See Comments)   "Sick" "I won't take anything that has metformin in it"   Tetracyclines & Related Hives, Rash   Diovan [valsartan] Nausea Only       Sulfa Antibiotics Rash, Other (See Comments)   As child   Xanax [alprazolam] Other (See Comments)  Unknown reaction      Medication List    TAKE these medications   acetaminophen 650 MG CR tablet Commonly known as: TYLENOL Take 650 mg by mouth every 8 (eight) hours as needed for pain.   albuterol 108 (90 Base) MCG/ACT inhaler Commonly known as: VENTOLIN HFA Inhale 2 puffs into the lungs every 8 (eight) hours as needed for wheezing or shortness of breath.   amoxicillin 500 MG capsule Commonly known as:  AMOXIL Take 500 mg by mouth 2 (two) times daily.   Anoro Ellipta 62.5-25 MCG/INH Aepb Generic drug: umeclidinium-vilanterol Inhale 1 puff into the lungs daily.   aspirin EC 81 MG tablet Take 1 tablet (81 mg total) by mouth daily.   benzonatate 200 MG capsule Commonly known as: TESSALON Take 1 capsule (200 mg total) by mouth 2 (two) times daily as needed for cough.   buPROPion 75 MG tablet Commonly known as: WELLBUTRIN TAKE 1 TABLET BY MOUTH TWICE A DAY What changed:   how much to take  when to take this  additional instructions   buPROPion 150 MG 24 hr tablet Commonly known as: Wellbutrin XL Take 1 tablet (150 mg total) by mouth daily. What changed: Another medication with the same name was changed. Make sure you understand how and when to take each.   butalbital-acetaminophen-caffeine 50-325-40 MG tablet Commonly known as: FIORICET Take 1 tablet by mouth every 6 (six) hours as needed for headache.   cholestyramine light 4 g packet Commonly known as: PREVALITE Take 4 g by mouth 3 (three) times daily as needed (diarrhea).   citalopram 10 MG tablet Commonly known as: CELEXA Take 1 tablet (10 mg total) by mouth daily. What changed: when to take this   clotrimazole-betamethasone cream Commonly known as: Lotrisone Apply 1 application topically 2 (two) times daily. What changed:   when to take this  reasons to take this   cyanocobalamin 1000 MCG/ML injection Commonly known as: (VITAMIN B-12) Inject 1 ml (1000 mcg ) IM weekly x 4,  Then monthly thereafter What changed:   how much to take  how to take this  when to take this  additional instructions   diazepam 5 MG tablet Commonly known as: VALIUM Take 1 tablet (5 mg total) by mouth at bedtime as needed for anxiety. What changed: when to take this   dicyclomine 10 MG capsule Commonly known as: BENTYL Take 1 capsule (10 mg total) by mouth 4 (four) times daily -  before meals and at bedtime.    diphenhydrAMINE 25 MG tablet Commonly known as: BENADRYL Take 50 mg by mouth daily as needed for allergies.   Eszopiclone 3 MG Tabs Take 1 tablet (3 mg total) by mouth at bedtime.   ibuprofen 600 MG tablet Commonly known as: ADVIL Take 600 mg by mouth every 6 (six) hours as needed for moderate pain.   Insulin Pen Needle 31G X 5 MM Misc Use to inject insulin daily   Lantus SoloStar 100 UNIT/ML Solostar Pen Generic drug: Insulin Glargine Inject 11 Units into the skin daily. What changed: how much to take   levETIRAcetam 750 MG tablet Commonly known as: KEPPRA Take 1 tablet (750 mg total) by mouth at bedtime. What changed:   when to take this  reasons to take this   NONFORMULARY OR COMPOUNDED [place]  Combination Pain Cream -  Baclofen 2%, Doxepin 5%, Gabapentin 6%, Topiramate 2%, Pentoxifylline 3% Apply 1-2 grams to affected area 3-4 times daily Qty. 120 gm  3 refills   Nyamyc powder Generic drug: nystatin Apply 1 g topically 4 (four) times daily as needed (rash).   ondansetron 4 MG tablet Commonly known as: Zofran Take 1 tablet (4 mg total) by mouth every 8 (eight) hours as needed for nausea or vomiting.   ONE TOUCH DELICA LANCING DEV Misc Use to test blood glucose once daily. Dx code = Y59.0   OneTouch Delica Lancets Fine Misc Use to test blood sguar once daily. Dx code = E11.9   OneTouch Verio test strip Generic drug: glucose blood USE TO TEST BLOOD SUGAR ONCE DAILY. DX CODE = E11.9   OneTouch Verio w/Device Kit 1 Device by Does not apply route daily. Use to test blood glucose once daily. Dx code = E11.9   OVER THE COUNTER MEDICATION Apply 1 application topically daily as needed (pain). [place] Gell   pregabalin 100 MG capsule Commonly known as: LYRICA Take 100 mg by mouth at bedtime as needed (leg cramps).   rosuvastatin 10 MG tablet Commonly known as: CRESTOR Take 1 tablet (10 mg total) by mouth daily. What changed: when to take  this   SYRINGE 3CC/25GX1" 25G X 1" 3 ML Misc Use for b12 injections   traMADol 50 MG tablet Commonly known as: ULTRAM Take 0.5 tablets (25 mg total) by mouth every 12 (twelve) hours as needed for severe pain. What changed: how much to take      Patient stable for d/c, vitals stable, pain adequately controlled, patient to receive assistance at home from family, PT, OT, SW, RN and hired help.  Call with questions or concerns. Patient understands reasons to call.  Recommended colace or miralax for assistance with regaining normal BM schedule.  Follow up with PCP/cardio for evaluation of regularly irregular heart sounds, she is asymptomatic.   Follow up in 1 week.  Signed: Carola Rhine Jone Panebianco 06/19/2019, 9:54 AM

## 2019-06-19 NOTE — Telephone Encounter (Signed)
Denise Sharp called to update Dr [name] re: Denise Sharp had difficulty reaching pt to schedule, so new start of care date will be Monday 06/22/2019.

## 2019-06-20 DIAGNOSIS — D649 Anemia, unspecified: Secondary | ICD-10-CM | POA: Diagnosis not present

## 2019-06-20 DIAGNOSIS — E669 Obesity, unspecified: Secondary | ICD-10-CM | POA: Diagnosis not present

## 2019-06-20 DIAGNOSIS — F339 Major depressive disorder, recurrent, unspecified: Secondary | ICD-10-CM | POA: Diagnosis not present

## 2019-06-20 DIAGNOSIS — K580 Irritable bowel syndrome with diarrhea: Secondary | ICD-10-CM | POA: Diagnosis not present

## 2019-06-20 DIAGNOSIS — N319 Neuromuscular dysfunction of bladder, unspecified: Secondary | ICD-10-CM | POA: Diagnosis not present

## 2019-06-20 DIAGNOSIS — R2689 Other abnormalities of gait and mobility: Secondary | ICD-10-CM | POA: Diagnosis not present

## 2019-06-20 DIAGNOSIS — F411 Generalized anxiety disorder: Secondary | ICD-10-CM | POA: Diagnosis not present

## 2019-06-20 DIAGNOSIS — R531 Weakness: Secondary | ICD-10-CM | POA: Diagnosis not present

## 2019-06-20 DIAGNOSIS — R3914 Feeling of incomplete bladder emptying: Secondary | ICD-10-CM | POA: Diagnosis not present

## 2019-06-20 DIAGNOSIS — N3946 Mixed incontinence: Secondary | ICD-10-CM | POA: Diagnosis not present

## 2019-06-20 DIAGNOSIS — Z9682 Presence of neurostimulator: Secondary | ICD-10-CM | POA: Diagnosis not present

## 2019-06-20 DIAGNOSIS — Z794 Long term (current) use of insulin: Secondary | ICD-10-CM | POA: Diagnosis not present

## 2019-06-20 DIAGNOSIS — Z4801 Encounter for change or removal of surgical wound dressing: Secondary | ICD-10-CM | POA: Diagnosis not present

## 2019-06-20 DIAGNOSIS — R262 Difficulty in walking, not elsewhere classified: Secondary | ICD-10-CM | POA: Diagnosis not present

## 2019-06-20 DIAGNOSIS — Z48817 Encounter for surgical aftercare following surgery on the skin and subcutaneous tissue: Secondary | ICD-10-CM | POA: Diagnosis not present

## 2019-06-20 DIAGNOSIS — N814 Uterovaginal prolapse, unspecified: Secondary | ICD-10-CM | POA: Diagnosis not present

## 2019-06-20 DIAGNOSIS — G4733 Obstructive sleep apnea (adult) (pediatric): Secondary | ICD-10-CM | POA: Diagnosis not present

## 2019-06-20 DIAGNOSIS — I10 Essential (primary) hypertension: Secondary | ICD-10-CM | POA: Diagnosis not present

## 2019-06-20 DIAGNOSIS — M14679 Charcot's joint, unspecified ankle and foot: Secondary | ICD-10-CM | POA: Diagnosis not present

## 2019-06-20 DIAGNOSIS — E1140 Type 2 diabetes mellitus with diabetic neuropathy, unspecified: Secondary | ICD-10-CM | POA: Diagnosis not present

## 2019-06-20 DIAGNOSIS — Z6841 Body Mass Index (BMI) 40.0 and over, adult: Secondary | ICD-10-CM | POA: Diagnosis not present

## 2019-06-22 ENCOUNTER — Telehealth: Payer: Self-pay | Admitting: Plastic Surgery

## 2019-06-22 ENCOUNTER — Ambulatory Visit: Payer: Medicare Other | Admitting: Internal Medicine

## 2019-06-22 NOTE — Telephone Encounter (Signed)
[place], from [facility], called to request verbal orders for [name] for the following: Nursing 3 times per week for 1 week, 2 times per week for times 2, 1 time per week times 4. She said they also ordered PT, OT, Social Work to start. Please call her back to give verbal approval. Also, after patient's visit on Wednesday, please call in wound care orders. [phone]

## 2019-06-23 DIAGNOSIS — Z4801 Encounter for change or removal of surgical wound dressing: Secondary | ICD-10-CM | POA: Diagnosis not present

## 2019-06-23 DIAGNOSIS — K580 Irritable bowel syndrome with diarrhea: Secondary | ICD-10-CM | POA: Diagnosis not present

## 2019-06-23 DIAGNOSIS — E1140 Type 2 diabetes mellitus with diabetic neuropathy, unspecified: Secondary | ICD-10-CM | POA: Diagnosis not present

## 2019-06-23 DIAGNOSIS — Z794 Long term (current) use of insulin: Secondary | ICD-10-CM | POA: Diagnosis not present

## 2019-06-23 DIAGNOSIS — F339 Major depressive disorder, recurrent, unspecified: Secondary | ICD-10-CM | POA: Diagnosis not present

## 2019-06-23 DIAGNOSIS — Z48817 Encounter for surgical aftercare following surgery on the skin and subcutaneous tissue: Secondary | ICD-10-CM | POA: Diagnosis not present

## 2019-06-24 ENCOUNTER — Other Ambulatory Visit: Payer: Self-pay

## 2019-06-24 ENCOUNTER — Other Ambulatory Visit: Payer: Self-pay | Admitting: *Deleted

## 2019-06-24 ENCOUNTER — Telehealth: Payer: Self-pay

## 2019-06-24 ENCOUNTER — Ambulatory Visit (INDEPENDENT_AMBULATORY_CARE_PROVIDER_SITE_OTHER): Payer: Medicare Other | Admitting: Plastic Surgery

## 2019-06-24 VITALS — BP 117/74 | HR 66 | Temp 98.3°F | Ht 64.5 in | Wt 233.0 lb

## 2019-06-24 DIAGNOSIS — Z4801 Encounter for change or removal of surgical wound dressing: Secondary | ICD-10-CM | POA: Diagnosis not present

## 2019-06-24 DIAGNOSIS — K580 Irritable bowel syndrome with diarrhea: Secondary | ICD-10-CM | POA: Diagnosis not present

## 2019-06-24 DIAGNOSIS — Z48817 Encounter for surgical aftercare following surgery on the skin and subcutaneous tissue: Secondary | ICD-10-CM | POA: Diagnosis not present

## 2019-06-24 DIAGNOSIS — E1140 Type 2 diabetes mellitus with diabetic neuropathy, unspecified: Secondary | ICD-10-CM | POA: Diagnosis not present

## 2019-06-24 DIAGNOSIS — E65 Localized adiposity: Secondary | ICD-10-CM

## 2019-06-24 DIAGNOSIS — F339 Major depressive disorder, recurrent, unspecified: Secondary | ICD-10-CM | POA: Diagnosis not present

## 2019-06-24 DIAGNOSIS — Z794 Long term (current) use of insulin: Secondary | ICD-10-CM | POA: Diagnosis not present

## 2019-06-24 NOTE — Telephone Encounter (Signed)
Called and [facility] @ 11:08am @ [phone]) asking Denise Sharp w/[facility] to RTC regarding the message below.//AB/CMA

## 2019-06-24 NOTE — Telephone Encounter (Signed)
Called but no response, this will be fine. In regards to the bariatric toilet seat, I am unsure how to order this, will speak with Dr. Claudia Desanctis and get his thoughts

## 2019-06-24 NOTE — Telephone Encounter (Signed)
Received call from [facility] with [facility] requesting verbal orders for OT. Frequency: once a week for 5 weeks. Lattie Haw is also enquiring about whether we can order a bariatric sized raised toilet seat for patient. Contact Lisa at [phone].

## 2019-06-24 NOTE — Progress Notes (Signed)
Patient is here postop from a panniculectomy.  She is doing well with no complaints.  She has not had much pain.  Denies any fevers chills or shortness of breath.  They done a reasonable job of taking measurements of the drain fluid coming out and it seems like the right side has put out 25 cc in the past 24 to 36 hours and the left side is put out around 45.  We removed her Prevena incisional VAC revealing an intact incision it looks like it is healing nicely with no sign of a complication.  I removed the drain on the right side without any issue.  We will leave the drain on the left side for another week and I will likely be able to remove it next week.  In the meantime she can continue with her rehabilitation with home health physical therapy and Occupational Therapy.  They are setting her up with plenty of equipment there to help her get around in the meantime.  She is overall very happy with things so far.

## 2019-06-24 NOTE — Patient Outreach (Signed)
[facility]) Care Management  06/24/2019  Denise Sharp May 29, 1946 [id number]   RED ON [place] discharge Day # 4 Date: 12/8 Red Alert Reason: other questions/problems   Outreach attempt #1, successful.  Referral received from care management assistant as member was recently discharged from hospital after having elective panniculectomy.  Primary MD office listed as completing TOC assessment.  Per chart, she also has history of HTN, OSA, DM (controlled), and obesity.    Call placed to member, identity verified.  This care manager introduced self and stated purpose of call.  [facility] care management services explained.  She report she is progressing but was frustrated with her discharge.  State her care was excellent but when it was time for discharge no one reviewed the paperwork with her or had her sign, state it was handed to her daughter.  She has since reviewed information with the help of her daughter.  She lives with her husband who has also just been released from the hospital and has restrictions.  He daughter comes in daily to help them both with ADL's, she also is paying a private company to have an aide come in several days a week.  She is active with [facility] for home health nursing, PT, & OT but report she was told that they didn't have aide services.  She will continue to pay out of pocket for assistance until she has increased her strength and able to function 100% independently.  Also has support of other friends/family providing meals.  She had follow up appointment today with surgeon, will have another follow up appointment next week.  She denies any medication changes, denies having financial concerns to pay for medication.   Complains that she was delivered [facility] 3 in 1 to go over her toilet but she is unable to use it due to her size.  She is requesting a new one that is larger.  She was unsure which company provided DME, per chart it was [place].  Advised that this care manager will call to inquire about exchange and notify her of update.  Call was placed to [employer], notified that the device was provided by insurance based on member's weight.  They are unable to provide her with a larger one because she does not meet the weight requirements for it (would have to be greater than 300 pounds).    Call was placed back to member to provide update and to complete assessment of needs but no answer. HIPAA compliant voice message left.  Plan: RN CM will follow up within the next week to complete assessment of needs and complete initial assessment with plan of care if applicable.

## 2019-06-25 DIAGNOSIS — K580 Irritable bowel syndrome with diarrhea: Secondary | ICD-10-CM | POA: Diagnosis not present

## 2019-06-25 DIAGNOSIS — Z4801 Encounter for change or removal of surgical wound dressing: Secondary | ICD-10-CM | POA: Diagnosis not present

## 2019-06-25 DIAGNOSIS — F339 Major depressive disorder, recurrent, unspecified: Secondary | ICD-10-CM | POA: Diagnosis not present

## 2019-06-25 DIAGNOSIS — E1140 Type 2 diabetes mellitus with diabetic neuropathy, unspecified: Secondary | ICD-10-CM | POA: Diagnosis not present

## 2019-06-25 DIAGNOSIS — Z48817 Encounter for surgical aftercare following surgery on the skin and subcutaneous tissue: Secondary | ICD-10-CM | POA: Diagnosis not present

## 2019-06-25 DIAGNOSIS — Z794 Long term (current) use of insulin: Secondary | ICD-10-CM | POA: Diagnosis not present

## 2019-06-25 NOTE — Telephone Encounter (Signed)
Called and spoke with Lattie Haw with [facility] regarding the message below requesting verbal orders.  Per Dr. Claudia Desanctis and [facility] on the orders for OT and the frequency.    She verbalized understanding and agreed.  Informed Lattie Haw that we do not order equipment for the patient, and she stated that she will reach out to the patient's PCP to get the bariatric toilet seat.//AB/CMA

## 2019-06-25 NOTE — Telephone Encounter (Signed)
Called and spoke with Denise Sharp with [facility] on (06/24/19) regarding the message below.  She stated that she has already spoken with Dr. Claudia Desanctis on (06/23/19) and he gave her verbal approval for the orders below.//AB/CMA

## 2019-06-26 ENCOUNTER — Telehealth: Payer: Self-pay | Admitting: *Deleted

## 2019-06-26 DIAGNOSIS — E1140 Type 2 diabetes mellitus with diabetic neuropathy, unspecified: Secondary | ICD-10-CM | POA: Diagnosis not present

## 2019-06-26 DIAGNOSIS — Z48817 Encounter for surgical aftercare following surgery on the skin and subcutaneous tissue: Secondary | ICD-10-CM | POA: Diagnosis not present

## 2019-06-26 DIAGNOSIS — F339 Major depressive disorder, recurrent, unspecified: Secondary | ICD-10-CM | POA: Diagnosis not present

## 2019-06-26 DIAGNOSIS — Z794 Long term (current) use of insulin: Secondary | ICD-10-CM | POA: Diagnosis not present

## 2019-06-26 DIAGNOSIS — Z4801 Encounter for change or removal of surgical wound dressing: Secondary | ICD-10-CM | POA: Diagnosis not present

## 2019-06-26 DIAGNOSIS — K580 Irritable bowel syndrome with diarrhea: Secondary | ICD-10-CM | POA: Diagnosis not present

## 2019-06-26 NOTE — Telephone Encounter (Signed)
Received Home Health Certification and Plan of Care via fax from [facility].  Given to provider to sign.//AB/CMA

## 2019-06-27 DIAGNOSIS — K580 Irritable bowel syndrome with diarrhea: Secondary | ICD-10-CM | POA: Diagnosis not present

## 2019-06-27 DIAGNOSIS — Z794 Long term (current) use of insulin: Secondary | ICD-10-CM | POA: Diagnosis not present

## 2019-06-27 DIAGNOSIS — F339 Major depressive disorder, recurrent, unspecified: Secondary | ICD-10-CM | POA: Diagnosis not present

## 2019-06-27 DIAGNOSIS — Z48817 Encounter for surgical aftercare following surgery on the skin and subcutaneous tissue: Secondary | ICD-10-CM | POA: Diagnosis not present

## 2019-06-27 DIAGNOSIS — Z4801 Encounter for change or removal of surgical wound dressing: Secondary | ICD-10-CM | POA: Diagnosis not present

## 2019-06-27 DIAGNOSIS — E1140 Type 2 diabetes mellitus with diabetic neuropathy, unspecified: Secondary | ICD-10-CM | POA: Diagnosis not present

## 2019-06-30 ENCOUNTER — Telehealth: Payer: Self-pay | Admitting: *Deleted

## 2019-06-30 DIAGNOSIS — Z48817 Encounter for surgical aftercare following surgery on the skin and subcutaneous tissue: Secondary | ICD-10-CM | POA: Diagnosis not present

## 2019-06-30 DIAGNOSIS — K580 Irritable bowel syndrome with diarrhea: Secondary | ICD-10-CM | POA: Diagnosis not present

## 2019-06-30 DIAGNOSIS — Z794 Long term (current) use of insulin: Secondary | ICD-10-CM | POA: Diagnosis not present

## 2019-06-30 DIAGNOSIS — Z4801 Encounter for change or removal of surgical wound dressing: Secondary | ICD-10-CM | POA: Diagnosis not present

## 2019-06-30 DIAGNOSIS — E1140 Type 2 diabetes mellitus with diabetic neuropathy, unspecified: Secondary | ICD-10-CM | POA: Diagnosis not present

## 2019-06-30 DIAGNOSIS — F339 Major depressive disorder, recurrent, unspecified: Secondary | ICD-10-CM | POA: Diagnosis not present

## 2019-06-30 NOTE — Telephone Encounter (Signed)
Received Physician Orders via from [facility] on (06/29/19) to sign and date.  Given to provider to sign.//AB/CMA

## 2019-07-01 ENCOUNTER — Telehealth: Payer: Self-pay

## 2019-07-01 ENCOUNTER — Other Ambulatory Visit: Payer: Self-pay | Admitting: *Deleted

## 2019-07-01 DIAGNOSIS — E1140 Type 2 diabetes mellitus with diabetic neuropathy, unspecified: Secondary | ICD-10-CM | POA: Diagnosis not present

## 2019-07-01 DIAGNOSIS — Z4801 Encounter for change or removal of surgical wound dressing: Secondary | ICD-10-CM | POA: Diagnosis not present

## 2019-07-01 DIAGNOSIS — K580 Irritable bowel syndrome with diarrhea: Secondary | ICD-10-CM | POA: Diagnosis not present

## 2019-07-01 DIAGNOSIS — F339 Major depressive disorder, recurrent, unspecified: Secondary | ICD-10-CM | POA: Diagnosis not present

## 2019-07-01 DIAGNOSIS — Z794 Long term (current) use of insulin: Secondary | ICD-10-CM | POA: Diagnosis not present

## 2019-07-01 DIAGNOSIS — Z48817 Encounter for surgical aftercare following surgery on the skin and subcutaneous tissue: Secondary | ICD-10-CM | POA: Diagnosis not present

## 2019-07-01 NOTE — Telephone Encounter (Signed)
Home Health Certification and Plan of Care was signed and faxed.  Confirmation received.//AB/CMA

## 2019-07-01 NOTE — Patient Outreach (Signed)
[facility]) Care Management  07/01/2019  EMERSON BARRETTO [birth year] [id number]   Outreach attempt #2, successful.  Call placed to member to complete assessment of needs and provide update of concerns regarding BSC that was reportedly too small.  She report she is improving, feeling much better.  State she is now able to ambulate without the walker and require minimal assistance with ADLs.  She has follow up appointment with surgeon tomorrow.  Advised that she is not able to exchange BSC due to weight requirements for larger one (she would need to be greater than 300 pounds).  Expresses frustration but also verbalizes understanding.  Denies any further needs from [facility], advised to contact this care manager should condition/needs change.  Will close case at this time, no further needs identified.  Valente David, [place], MSN [place] [phone]

## 2019-07-01 NOTE — Telephone Encounter (Signed)
Called patient to confirm appointment scheduled for tomorrow. Patient answered the following questions: 1. To the best of your knowledge, have you been in close contact with any one with a confirmed diagnosis of COVID-19? No 2. Have you had any one or more of the following; fever, chills, cough, shortness of breath, or any flu-like symptoms? No 3. Have you been diagnosed with or have a previous diagnosis of COVID 19? No a. I am going to go over a few other symptoms with you. Please let me know if you are experiencing any of the following: Ear, nose, or throat discomfort b. A sore throat c. Headache- YES d. Muscle pain e. Diarrhea f. Loss of taste or smell

## 2019-07-01 NOTE — Telephone Encounter (Signed)
Physician Order was signed and faxed.  Confirmation received.//AB/CMA

## 2019-07-02 ENCOUNTER — Other Ambulatory Visit: Payer: Self-pay

## 2019-07-02 ENCOUNTER — Ambulatory Visit: Payer: Medicare Other | Admitting: Plastic Surgery

## 2019-07-02 ENCOUNTER — Encounter: Payer: Self-pay | Admitting: Surgical

## 2019-07-02 ENCOUNTER — Ambulatory Visit (INDEPENDENT_AMBULATORY_CARE_PROVIDER_SITE_OTHER): Payer: Medicare Other | Admitting: Surgical

## 2019-07-02 VITALS — BP 147/79 | HR 78 | Temp 97.3°F | Ht 65.0 in | Wt 246.8 lb

## 2019-07-02 DIAGNOSIS — Z48817 Encounter for surgical aftercare following surgery on the skin and subcutaneous tissue: Secondary | ICD-10-CM | POA: Diagnosis not present

## 2019-07-02 DIAGNOSIS — Z4801 Encounter for change or removal of surgical wound dressing: Secondary | ICD-10-CM | POA: Diagnosis not present

## 2019-07-02 DIAGNOSIS — K580 Irritable bowel syndrome with diarrhea: Secondary | ICD-10-CM | POA: Diagnosis not present

## 2019-07-02 DIAGNOSIS — E65 Localized adiposity: Secondary | ICD-10-CM

## 2019-07-02 DIAGNOSIS — Z794 Long term (current) use of insulin: Secondary | ICD-10-CM | POA: Diagnosis not present

## 2019-07-02 DIAGNOSIS — E1140 Type 2 diabetes mellitus with diabetic neuropathy, unspecified: Secondary | ICD-10-CM | POA: Diagnosis not present

## 2019-07-02 DIAGNOSIS — Z9889 Other specified postprocedural states: Secondary | ICD-10-CM

## 2019-07-02 DIAGNOSIS — F339 Major depressive disorder, recurrent, unspecified: Secondary | ICD-10-CM | POA: Diagnosis not present

## 2019-07-02 NOTE — Progress Notes (Signed)
   Subjective:     Patient ID: Denise Sharp, female    DOB: 01-May-1946, 73 y.o.   MRN: [id number]  Chief Complaint  Patient presents with  . Follow-up    1 week for pannic    HPI: The patient is a 73 y.o. female here for follow-up after panniculectomy 2 weeks ago with Dr. Claudia Desanctis. She was last seen 1 week ago and her right drain was removed.  Left drain in place today. Output has been ~ 30-40 cc of serosanguinous fluid over last few days. Her incision is healing well.   She reports overall she is doing well. She has been working with PT/OT. She is now walking with a cane and not a walker. She feels steady doing this.  No f/c/n/v, she has been wearing abdominal binder 24/7. She has difficulty changing it because she only has one person to assist her at a time (aide or nursing). Her husband is unable to assist.  Review of Systems  Constitutional: Positive for activity change. Negative for appetite change, chills, diaphoresis, fatigue and fever.  Respiratory: Negative for shortness of breath.   Cardiovascular: Negative for chest pain and leg swelling.  Gastrointestinal: Negative for abdominal distention, abdominal pain, nausea and vomiting.  Genitourinary: Negative for difficulty urinating.  Skin: Negative for color change, pallor, rash and wound.  Neurological: Negative for dizziness and headaches.    Objective:   Vital Signs BP (!) 147/79 (BP Location: Left Arm, Patient Position: Sitting, Cuff Size: Normal)   Pulse 78   Temp (!) 97.3 F (36.3 C) (Temporal)   Ht 5\' 5"  (1.651 m)   Wt 246 lb 12.8 oz (111.9 kg)   SpO2 98%   BMI 41.07 kg/m  Vital Signs and Nursing Note Reviewed Chaperone present Physical Exam  Constitutional: She is oriented to person, place, and time and well-developed, well-nourished, and in no distress.  Obese  HENT:  Head: Normocephalic and atraumatic.  Cardiovascular: Normal rate.  Pulmonary/Chest: Effort normal.  Abdominal: Soft. She exhibits no  distension. There is no abdominal tenderness.    Musculoskeletal:        General: No edema.  Neurological: She is alert and oriented to person, place, and time.  Walking with cane   Skin: Skin is warm and dry. No rash noted. She is not diaphoretic. No erythema.  Psychiatric: Mood and affect normal.      Assessment/Plan:     ICD-10-CM   1. Abdominal pannus  E65   2. S/P panniculectomy  [id number]     She is doing well overall, no sign of infection, dehiscence, seroma, hematoma.  Continue to work with PT and OT. Recommend continued therapy until PT and OT feel as if she has progressed back to her baseline.  Recommend healthy eating.  Left JP drain removed.   Continue to wear abdominal binder 24/7 for a total of 6 weeks post-op. Rx provided for new abdominal binder as her current binder is dirty. Recommend also washing the binder. She is going to a DME store today. Recommend sleeping inclined and not entirely flat.  Follow up in 2 weeks.    Carola Rhine Kalei Mckillop, PA-C 07/02/2019, 9:25 AM

## 2019-07-03 DIAGNOSIS — E1140 Type 2 diabetes mellitus with diabetic neuropathy, unspecified: Secondary | ICD-10-CM | POA: Diagnosis not present

## 2019-07-03 DIAGNOSIS — F339 Major depressive disorder, recurrent, unspecified: Secondary | ICD-10-CM | POA: Diagnosis not present

## 2019-07-03 DIAGNOSIS — Z794 Long term (current) use of insulin: Secondary | ICD-10-CM | POA: Diagnosis not present

## 2019-07-03 DIAGNOSIS — Z48817 Encounter for surgical aftercare following surgery on the skin and subcutaneous tissue: Secondary | ICD-10-CM | POA: Diagnosis not present

## 2019-07-03 DIAGNOSIS — Z4801 Encounter for change or removal of surgical wound dressing: Secondary | ICD-10-CM | POA: Diagnosis not present

## 2019-07-03 DIAGNOSIS — K580 Irritable bowel syndrome with diarrhea: Secondary | ICD-10-CM | POA: Diagnosis not present

## 2019-07-07 ENCOUNTER — Telehealth: Payer: Self-pay | Admitting: Internal Medicine

## 2019-07-07 DIAGNOSIS — Z794 Long term (current) use of insulin: Secondary | ICD-10-CM | POA: Diagnosis not present

## 2019-07-07 DIAGNOSIS — Z48817 Encounter for surgical aftercare following surgery on the skin and subcutaneous tissue: Secondary | ICD-10-CM | POA: Diagnosis not present

## 2019-07-07 DIAGNOSIS — E1140 Type 2 diabetes mellitus with diabetic neuropathy, unspecified: Secondary | ICD-10-CM | POA: Diagnosis not present

## 2019-07-07 DIAGNOSIS — K580 Irritable bowel syndrome with diarrhea: Secondary | ICD-10-CM | POA: Diagnosis not present

## 2019-07-07 DIAGNOSIS — F339 Major depressive disorder, recurrent, unspecified: Secondary | ICD-10-CM | POA: Diagnosis not present

## 2019-07-07 DIAGNOSIS — Z4801 Encounter for change or removal of surgical wound dressing: Secondary | ICD-10-CM | POA: Diagnosis not present

## 2019-07-07 MED ORDER — BUPROPION HCL 75 MG PO TABS: 75.0000 mg | ORAL_TABLET | ORAL | 1 refills | Status: DC

## 2019-07-07 NOTE — Telephone Encounter (Signed)
[name] changed the strength on her  buPROPion (WELLBUTRIN) 75 MG tablet. Pt stated that [name] told her to take 150mg  in the morning and 75 mg at night. Patient has plenty of 150mg  but can not cut the pill in half. Pt needs more of the 75mg .

## 2019-07-07 NOTE — Telephone Encounter (Signed)
Medication has been refilled and pt is aware that it was sent to [facility].

## 2019-07-08 DIAGNOSIS — F339 Major depressive disorder, recurrent, unspecified: Secondary | ICD-10-CM | POA: Diagnosis not present

## 2019-07-08 DIAGNOSIS — Z794 Long term (current) use of insulin: Secondary | ICD-10-CM | POA: Diagnosis not present

## 2019-07-08 DIAGNOSIS — Z48817 Encounter for surgical aftercare following surgery on the skin and subcutaneous tissue: Secondary | ICD-10-CM | POA: Diagnosis not present

## 2019-07-08 DIAGNOSIS — Z4801 Encounter for change or removal of surgical wound dressing: Secondary | ICD-10-CM | POA: Diagnosis not present

## 2019-07-08 DIAGNOSIS — K580 Irritable bowel syndrome with diarrhea: Secondary | ICD-10-CM | POA: Diagnosis not present

## 2019-07-08 DIAGNOSIS — E1140 Type 2 diabetes mellitus with diabetic neuropathy, unspecified: Secondary | ICD-10-CM | POA: Diagnosis not present

## 2019-07-12 DIAGNOSIS — Z4801 Encounter for change or removal of surgical wound dressing: Secondary | ICD-10-CM | POA: Diagnosis not present

## 2019-07-12 DIAGNOSIS — F339 Major depressive disorder, recurrent, unspecified: Secondary | ICD-10-CM | POA: Diagnosis not present

## 2019-07-12 DIAGNOSIS — K580 Irritable bowel syndrome with diarrhea: Secondary | ICD-10-CM | POA: Diagnosis not present

## 2019-07-12 DIAGNOSIS — Z48817 Encounter for surgical aftercare following surgery on the skin and subcutaneous tissue: Secondary | ICD-10-CM | POA: Diagnosis not present

## 2019-07-12 DIAGNOSIS — E1140 Type 2 diabetes mellitus with diabetic neuropathy, unspecified: Secondary | ICD-10-CM | POA: Diagnosis not present

## 2019-07-12 DIAGNOSIS — Z794 Long term (current) use of insulin: Secondary | ICD-10-CM | POA: Diagnosis not present

## 2019-07-13 ENCOUNTER — Telehealth: Payer: Self-pay | Admitting: *Deleted

## 2019-07-13 DIAGNOSIS — K580 Irritable bowel syndrome with diarrhea: Secondary | ICD-10-CM | POA: Diagnosis not present

## 2019-07-13 DIAGNOSIS — Z794 Long term (current) use of insulin: Secondary | ICD-10-CM | POA: Diagnosis not present

## 2019-07-13 DIAGNOSIS — Z48817 Encounter for surgical aftercare following surgery on the skin and subcutaneous tissue: Secondary | ICD-10-CM | POA: Diagnosis not present

## 2019-07-13 DIAGNOSIS — E1140 Type 2 diabetes mellitus with diabetic neuropathy, unspecified: Secondary | ICD-10-CM | POA: Diagnosis not present

## 2019-07-13 DIAGNOSIS — Z4801 Encounter for change or removal of surgical wound dressing: Secondary | ICD-10-CM | POA: Diagnosis not present

## 2019-07-13 DIAGNOSIS — F339 Major depressive disorder, recurrent, unspecified: Secondary | ICD-10-CM | POA: Diagnosis not present

## 2019-07-13 NOTE — Telephone Encounter (Signed)
Received Missed Visit Report on (07/08/19) via fax for review from [facility].  Given to provider to review.//AB/CMA   Provider reviewed the report.//AB/CMA

## 2019-07-15 DIAGNOSIS — F339 Major depressive disorder, recurrent, unspecified: Secondary | ICD-10-CM | POA: Diagnosis not present

## 2019-07-15 DIAGNOSIS — Z4801 Encounter for change or removal of surgical wound dressing: Secondary | ICD-10-CM | POA: Diagnosis not present

## 2019-07-15 DIAGNOSIS — K580 Irritable bowel syndrome with diarrhea: Secondary | ICD-10-CM | POA: Diagnosis not present

## 2019-07-15 DIAGNOSIS — Z794 Long term (current) use of insulin: Secondary | ICD-10-CM | POA: Diagnosis not present

## 2019-07-15 DIAGNOSIS — Z48817 Encounter for surgical aftercare following surgery on the skin and subcutaneous tissue: Secondary | ICD-10-CM | POA: Diagnosis not present

## 2019-07-15 DIAGNOSIS — E1140 Type 2 diabetes mellitus with diabetic neuropathy, unspecified: Secondary | ICD-10-CM | POA: Diagnosis not present

## 2019-07-16 ENCOUNTER — Encounter: Payer: Self-pay | Admitting: Surgical

## 2019-07-16 ENCOUNTER — Other Ambulatory Visit: Payer: Self-pay

## 2019-07-16 ENCOUNTER — Ambulatory Visit (INDEPENDENT_AMBULATORY_CARE_PROVIDER_SITE_OTHER): Payer: Medicare Other | Admitting: Surgical

## 2019-07-16 VITALS — BP 127/65 | HR 76 | Temp 97.7°F | Ht 65.0 in | Wt 247.0 lb

## 2019-07-16 DIAGNOSIS — Z9889 Other specified postprocedural states: Secondary | ICD-10-CM

## 2019-07-16 DIAGNOSIS — E65 Localized adiposity: Secondary | ICD-10-CM

## 2019-07-16 NOTE — Progress Notes (Signed)
Subjective:     Patient ID: Denise Sharp, female    DOB: January 03, 1946, 73 y.o.   MRN: [id number]  Chief Complaint  Patient presents with  . Post-op Follow-up    panniculectomy SX: 06/16/2019 along with right abdominal pain    HPI: The patient is a 73 y.o. female here for follow-up after panniculectomy 4 weeks ago with Dr. Claudia Desanctis.  Denise Sharp is doing well.  Denise Sharp reports normal bowel movements.  Denise Sharp denies any nausea, vomiting, chest pain, shortness breath, dizziness, weakness.  Denise Sharp continues to work with PT.  Denise Sharp reports Denise Sharp is doing very well and has even been able to get in and out of the car without using a stool, which Denise Sharp was unable to do prior to surgery.  Denise Sharp reports OT has discharged her at this time until Denise Sharp is at least 6 weeks postop and can do more complex therapy.  Denise Sharp continues to wear her abdominal binder, incision is healing really well.  Denise Sharp does have a small wound along the right lateral aspect of the panniculectomy incision, it is approximately 1 x 1 cm, superficial.  No periwound erythema.  No drainage noted.   Denise Sharp does report some right lower quadrant abdominal pain.  Denise Sharp has a history of appendicitis and had this removed.  Denise Sharp also had a cholecystectomy.  Denise Sharp reports the pain was this morning, but feels better after having multiple bowel movements.  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  Denise Sharp reports Denise Sharp is having some issues with insurance not paying for her aids, asked if we would inquire about this for her.  Review of Systems  Constitutional: Positive for activity change. Negative for chills, fatigue and fever.  Respiratory: Negative.   Cardiovascular: Negative.   Gastrointestinal: Negative for abdominal distention, abdominal pain, constipation, nausea and vomiting.  Musculoskeletal: Negative.   Neurological: Negative.      Objective:   Vital Signs BP 127/65 (BP Location: Left Arm, Patient Position: Sitting, Cuff Size: Normal)   Pulse 76    Temp 97.7 F (36.5 C) (Temporal)   Ht 5\' 5"  (1.651 m)   Wt 247 lb (112 kg)   SpO2 98%   BMI 41.10 kg/m  Vital Signs and Nursing Note Reviewed Chaperone present Physical Exam  Constitutional: Denise Sharp is oriented to person, place, and time and well-developed, well-nourished, and in no distress.  HENT:  Head: Normocephalic and atraumatic.  Cardiovascular: Normal rate.  Pulmonary/Chest: Effort normal.  Abdominal: Soft. Normal appearance. Denise Sharp exhibits no distension. There is abdominal tenderness (minimal, improved) in the right lower quadrant. There is no rebound.    Musculoskeletal:        General: Normal range of motion.  Neurological: Denise Sharp is alert and oriented to person, place, and time. Gait normal.  Skin: Skin is warm and dry. No rash noted. Denise Sharp is not diaphoretic. No erythema. No pallor.  Psychiatric: Mood and affect normal.    Assessment/Plan:     ICD-10-CM   1. Abdominal pannus  E65   2. S/P panniculectomy  Z98.890    Recommend Vaseline to the right abdominal fold wound, does not appear infected.  Denise Sharp does not have any sign of infection, seroma, hematoma.  Denise Sharp does not feel distended.  Continue to work with PT.  Allow PT to dictate when Denise Sharp is ready for discharge.  Continue to wear abdominal binder 24/7 for total of 6 weeks postop.  Denise Sharp can also transition to spanks if Denise Sharp can find a  pair that is tight and will come up above her incision.  Follow-up in 3 weeks for reevaluation.  Continue to eat healthy, multivitamin, vitamin C.  Call with any questions or concerns.  Carola Rhine Denesia Donelan, PA-C 07/16/2019, 12:00 PM

## 2019-07-20 DIAGNOSIS — Z4801 Encounter for change or removal of surgical wound dressing: Secondary | ICD-10-CM | POA: Diagnosis not present

## 2019-07-20 DIAGNOSIS — G4733 Obstructive sleep apnea (adult) (pediatric): Secondary | ICD-10-CM | POA: Diagnosis not present

## 2019-07-20 DIAGNOSIS — M14679 Charcot's joint, unspecified ankle and foot: Secondary | ICD-10-CM | POA: Diagnosis not present

## 2019-07-20 DIAGNOSIS — F411 Generalized anxiety disorder: Secondary | ICD-10-CM | POA: Diagnosis not present

## 2019-07-20 DIAGNOSIS — E1140 Type 2 diabetes mellitus with diabetic neuropathy, unspecified: Secondary | ICD-10-CM | POA: Diagnosis not present

## 2019-07-20 DIAGNOSIS — R531 Weakness: Secondary | ICD-10-CM | POA: Diagnosis not present

## 2019-07-20 DIAGNOSIS — R262 Difficulty in walking, not elsewhere classified: Secondary | ICD-10-CM | POA: Diagnosis not present

## 2019-07-20 DIAGNOSIS — N3946 Mixed incontinence: Secondary | ICD-10-CM | POA: Diagnosis not present

## 2019-07-20 DIAGNOSIS — Z48817 Encounter for surgical aftercare following surgery on the skin and subcutaneous tissue: Secondary | ICD-10-CM | POA: Diagnosis not present

## 2019-07-20 DIAGNOSIS — R3914 Feeling of incomplete bladder emptying: Secondary | ICD-10-CM | POA: Diagnosis not present

## 2019-07-20 DIAGNOSIS — F339 Major depressive disorder, recurrent, unspecified: Secondary | ICD-10-CM | POA: Diagnosis not present

## 2019-07-20 DIAGNOSIS — Z794 Long term (current) use of insulin: Secondary | ICD-10-CM | POA: Diagnosis not present

## 2019-07-20 DIAGNOSIS — I10 Essential (primary) hypertension: Secondary | ICD-10-CM | POA: Diagnosis not present

## 2019-07-20 DIAGNOSIS — D649 Anemia, unspecified: Secondary | ICD-10-CM | POA: Diagnosis not present

## 2019-07-20 DIAGNOSIS — Z9682 Presence of neurostimulator: Secondary | ICD-10-CM | POA: Diagnosis not present

## 2019-07-20 DIAGNOSIS — K580 Irritable bowel syndrome with diarrhea: Secondary | ICD-10-CM | POA: Diagnosis not present

## 2019-07-20 DIAGNOSIS — Z6841 Body Mass Index (BMI) 40.0 and over, adult: Secondary | ICD-10-CM | POA: Diagnosis not present

## 2019-07-20 DIAGNOSIS — E669 Obesity, unspecified: Secondary | ICD-10-CM | POA: Diagnosis not present

## 2019-07-20 DIAGNOSIS — N319 Neuromuscular dysfunction of bladder, unspecified: Secondary | ICD-10-CM | POA: Diagnosis not present

## 2019-07-20 DIAGNOSIS — N814 Uterovaginal prolapse, unspecified: Secondary | ICD-10-CM | POA: Diagnosis not present

## 2019-07-20 DIAGNOSIS — R2689 Other abnormalities of gait and mobility: Secondary | ICD-10-CM | POA: Diagnosis not present

## 2019-07-23 DIAGNOSIS — K580 Irritable bowel syndrome with diarrhea: Secondary | ICD-10-CM | POA: Diagnosis not present

## 2019-07-23 DIAGNOSIS — F339 Major depressive disorder, recurrent, unspecified: Secondary | ICD-10-CM | POA: Diagnosis not present

## 2019-07-23 DIAGNOSIS — E1140 Type 2 diabetes mellitus with diabetic neuropathy, unspecified: Secondary | ICD-10-CM | POA: Diagnosis not present

## 2019-07-23 DIAGNOSIS — Z48817 Encounter for surgical aftercare following surgery on the skin and subcutaneous tissue: Secondary | ICD-10-CM | POA: Diagnosis not present

## 2019-07-23 DIAGNOSIS — Z4801 Encounter for change or removal of surgical wound dressing: Secondary | ICD-10-CM | POA: Diagnosis not present

## 2019-07-23 DIAGNOSIS — Z794 Long term (current) use of insulin: Secondary | ICD-10-CM | POA: Diagnosis not present

## 2019-07-28 ENCOUNTER — Telehealth: Payer: Self-pay | Admitting: Internal Medicine

## 2019-07-28 DIAGNOSIS — Z48817 Encounter for surgical aftercare following surgery on the skin and subcutaneous tissue: Secondary | ICD-10-CM | POA: Diagnosis not present

## 2019-07-28 DIAGNOSIS — F339 Major depressive disorder, recurrent, unspecified: Secondary | ICD-10-CM | POA: Diagnosis not present

## 2019-07-28 DIAGNOSIS — Z4801 Encounter for change or removal of surgical wound dressing: Secondary | ICD-10-CM | POA: Diagnosis not present

## 2019-07-28 DIAGNOSIS — E1140 Type 2 diabetes mellitus with diabetic neuropathy, unspecified: Secondary | ICD-10-CM | POA: Diagnosis not present

## 2019-07-28 DIAGNOSIS — Z794 Long term (current) use of insulin: Secondary | ICD-10-CM | POA: Diagnosis not present

## 2019-07-28 DIAGNOSIS — K580 Irritable bowel syndrome with diarrhea: Secondary | ICD-10-CM | POA: Diagnosis not present

## 2019-07-28 NOTE — Telephone Encounter (Signed)
Pt needs a refill on dicyclomine (BENTYL) 10 MG capsule sent to [facility]  Mail order

## 2019-07-29 MED ORDER — DICYCLOMINE HCL 10 MG PO CAPS: 10.0000 mg | ORAL_CAPSULE | Freq: Three times a day (TID) | ORAL | 3 refills | Status: DC

## 2019-07-29 NOTE — Telephone Encounter (Signed)
Medication has been refilled and sent to [facility] mail order.

## 2019-07-30 ENCOUNTER — Telehealth: Payer: Self-pay | Admitting: *Deleted

## 2019-07-30 NOTE — Telephone Encounter (Signed)
Received Missed Visit Report via of fax from [facility] for provider to review.    Provider reviewed the report and signed.//AB/CMA

## 2019-08-03 ENCOUNTER — Ambulatory Visit (INDEPENDENT_AMBULATORY_CARE_PROVIDER_SITE_OTHER): Payer: Medicare Other | Admitting: Podiatry

## 2019-08-03 ENCOUNTER — Other Ambulatory Visit: Payer: Self-pay

## 2019-08-03 ENCOUNTER — Encounter: Payer: Self-pay | Admitting: Podiatry

## 2019-08-03 ENCOUNTER — Telehealth: Payer: Self-pay | Admitting: *Deleted

## 2019-08-03 DIAGNOSIS — B351 Tinea unguium: Secondary | ICD-10-CM | POA: Diagnosis not present

## 2019-08-03 DIAGNOSIS — M79676 Pain in unspecified toe(s): Secondary | ICD-10-CM

## 2019-08-03 DIAGNOSIS — E1149 Type 2 diabetes mellitus with other diabetic neurological complication: Secondary | ICD-10-CM | POA: Diagnosis not present

## 2019-08-03 NOTE — Telephone Encounter (Signed)
Received Orders on (07/30/19) for signature via fax from [facility].  Given to provider to sign.  Orders signed and faxed to [facility].//AB/CMA

## 2019-08-03 NOTE — Progress Notes (Signed)
Complaint:  Visit Type: Patient returns to my office for continued preventative foot care services. Complaint: Patient states" my nails have grown long and thick and become painful to walk and wear shoes" Patient has been diagnosed with DM with no foot complications. The patient presents for preventative foot care services. No changes to ROS.  Patient says she is developing multiple skin lesions.   Podiatric Exam: Vascular: dorsalis pedis and posterior tibial pulses are palpable bilateral. Capillary return is immediate. Temperature gradient is WNL. Skin turgor WNL  Sensorium: Absent  Semmes Weinstein monofilament test. Normal tactile sensation bilaterally. Nail Exam: Pt has thick disfigured discolored nails with subungual debris noted bilateral entire nail hallux through fifth toenails Ulcer Exam: There is no evidence of ulcer or pre-ulcerative changes or infection. Orthopedic Exam: Muscle tone and strength are WNL. No limitations in general ROM. No crepitus or effusions noted. Foot type and digits show no abnormalities. Bony prominences are unremarkable. 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  Overlapping second digit left foot. Skin: No Porokeratosis. No infection or ulcers  Diagnosis:  Onychomycosis, , Pain in right toe, pain in left toes Diabetes with neuropathy  Treatment & Plan Procedures and Treatment: Consent by patient was obtained for treatment procedures. The patient understood the discussion of treatment and procedures well. All questions were answered thoroughly reviewed. Debridement of mycotic and hypertrophic toenails, 1 through 5 bilateral and clearing of subungual debris. No ulceration, no infection noted.   Return Visit-Office Procedure: Patient instructed to return to the office for a follow up visit 3 months for continued evaluation and treatment.    Kniyah Khun DPM 

## 2019-08-05 ENCOUNTER — Telehealth: Payer: Self-pay

## 2019-08-05 ENCOUNTER — Other Ambulatory Visit: Payer: Self-pay | Admitting: Internal Medicine

## 2019-08-05 NOTE — Telephone Encounter (Addendum)
Patient called to inform us that she has a cough that has progressively worsened and wanted to know if she should come to her follow up appointment tomorrow. She also complained of abdominal swelling but noted that she did not wear her spanx yesterday. She also wanted to know if she is able to shower by herself.   Per [employer], PA-C we recommended she follow up with her PCP for the cough and possible Covid testing, and to call us back to reschedule. She should continue with the spanx and is able to shower by herself if she feels comfortable doing so. Patient will call us back if she has any further questions or concerns.

## 2019-08-06 ENCOUNTER — Telehealth: Payer: Self-pay | Admitting: *Deleted

## 2019-08-06 ENCOUNTER — Ambulatory Visit: Payer: Medicare Other | Admitting: Surgical

## 2019-08-06 DIAGNOSIS — Z03818 Encounter for observation for suspected exposure to other biological agents ruled out: Secondary | ICD-10-CM | POA: Diagnosis not present

## 2019-08-06 DIAGNOSIS — Z20828 Contact with and (suspected) exposure to other viral communicable diseases: Secondary | ICD-10-CM | POA: Diagnosis not present

## 2019-08-06 NOTE — Telephone Encounter (Addendum)
Received Missed visit report via of fax from [facility] for provider to review.  Provider reviewed and signed.  A copy was scanned into the chart.//AB/CMA

## 2019-08-06 NOTE — Telephone Encounter (Signed)
Opened in error.//AB/CMA 

## 2019-08-07 ENCOUNTER — Other Ambulatory Visit: Payer: Self-pay

## 2019-08-07 ENCOUNTER — Encounter: Payer: Self-pay | Admitting: Internal Medicine

## 2019-08-07 ENCOUNTER — Ambulatory Visit (INDEPENDENT_AMBULATORY_CARE_PROVIDER_SITE_OTHER): Payer: Medicare Other | Admitting: Internal Medicine

## 2019-08-07 DIAGNOSIS — K2950 Unspecified chronic gastritis without bleeding: Secondary | ICD-10-CM | POA: Diagnosis not present

## 2019-08-07 DIAGNOSIS — K2970 Gastritis, unspecified, without bleeding: Secondary | ICD-10-CM | POA: Insufficient documentation

## 2019-08-07 DIAGNOSIS — E65 Localized adiposity: Secondary | ICD-10-CM

## 2019-08-07 DIAGNOSIS — Z20822 Contact with and (suspected) exposure to covid-19: Secondary | ICD-10-CM | POA: Insufficient documentation

## 2019-08-07 DIAGNOSIS — J209 Acute bronchitis, unspecified: Secondary | ICD-10-CM | POA: Diagnosis not present

## 2019-08-07 DIAGNOSIS — Z48817 Encounter for surgical aftercare following surgery on the skin and subcutaneous tissue: Secondary | ICD-10-CM | POA: Diagnosis not present

## 2019-08-07 DIAGNOSIS — Z7189 Other specified counseling: Secondary | ICD-10-CM | POA: Diagnosis not present

## 2019-08-07 DIAGNOSIS — E1140 Type 2 diabetes mellitus with diabetic neuropathy, unspecified: Secondary | ICD-10-CM | POA: Diagnosis not present

## 2019-08-07 DIAGNOSIS — Z794 Long term (current) use of insulin: Secondary | ICD-10-CM | POA: Diagnosis not present

## 2019-08-07 DIAGNOSIS — J0100 Acute maxillary sinusitis, unspecified: Secondary | ICD-10-CM

## 2019-08-07 DIAGNOSIS — K591 Functional diarrhea: Secondary | ICD-10-CM

## 2019-08-07 DIAGNOSIS — Z4801 Encounter for change or removal of surgical wound dressing: Secondary | ICD-10-CM | POA: Diagnosis not present

## 2019-08-07 DIAGNOSIS — Z03818 Encounter for observation for suspected exposure to other biological agents ruled out: Secondary | ICD-10-CM | POA: Insufficient documentation

## 2019-08-07 DIAGNOSIS — F339 Major depressive disorder, recurrent, unspecified: Secondary | ICD-10-CM | POA: Diagnosis not present

## 2019-08-07 DIAGNOSIS — K580 Irritable bowel syndrome with diarrhea: Secondary | ICD-10-CM | POA: Diagnosis not present

## 2019-08-07 MED ORDER — AMOXICILLIN 500 MG PO CAPS: 500.0000 mg | ORAL_CAPSULE | Freq: Three times a day (TID) | ORAL | 0 refills | Status: DC

## 2019-08-07 MED ORDER — ALBUTEROL SULFATE HFA 108 (90 BASE) MCG/ACT IN AERS: 2.0000 | INHALATION_SPRAY | Freq: Three times a day (TID) | RESPIRATORY_TRACT | 2 refills | Status: DC | PRN

## 2019-08-07 MED ORDER — ONDANSETRON 4 MG PO TBDP: 4.0000 mg | ORAL_TABLET | Freq: Three times a day (TID) | ORAL | 0 refills | Status: DC | PRN

## 2019-08-07 MED ORDER — PANTOPRAZOLE SODIUM 40 MG PO TBEC: 40.0000 mg | DELAYED_RELEASE_TABLET | Freq: Every day | ORAL | 3 refills | Status: DC

## 2019-08-07 NOTE — Assessment & Plan Note (Signed)
S/p panniculectomy on Dec 1.  Recovering at home

## 2019-08-07 NOTE — Assessment & Plan Note (Signed)
Improving since surgery,

## 2019-08-07 NOTE — Progress Notes (Signed)
Telephone  Note  This visit type was conducted due to national recommendations for restrictions regarding the COVID-19 pandemic (e.g. social distancing).  This format is felt to be most appropriate for this patient at this time.  All issues noted in this document were discussed and addressed.  No physical exam was performed (except for noted visual exam findings with Video Visits).   I connected with@ on 08/07/19 at  8:30 AM EST by  telephone and verified that I am speaking with the correct person using two identifiers. Location patient: home Location provider: work or home office Persons participating in the virtual visit: patient, provider  I discussed the limitations, risks, security and privacy concerns of performing an evaluation and management service by telephone and the availability of in person appointments. I also discussed with the patient that there may be a patient responsible charge related to this service. The patient expressed understanding and agreed to proceed.   Reason for visit: multiple issues   HPI:   74 yr old female s/p panniculectomy on Dec 1  Presents with cough, post nasal drip, facial pain,  Altered sense of taste/metallic taste in mouth at night.  Treated at urgent care and was covid negative yesterday given duaghter's family being positive . Feels she may be wheezing but demonstrates VCD . Denies fevers.    Has been isolating for 6 weeks since her abdominal surgery.  Has been activity restricted by surgeon.  Gaining weight despite eating very little.  Has adjusted Lantus dose from 13 to 15 units and BS have been running from 140's down to 110.    "everything I eat makes me sick"  From a boiled egg to more,  Makes her nauseated and bloated but does not vomit.. meals are erratic .has a nurses aide that she is paying out of pocket, to help her prepare meals and sponge bathe, dress, laundry . Not taking any pain medication   No pain . Not taking protonix or anti emetic.     ROS: See pertinent positives and negatives per HPI.  Past Medical History:  Diagnosis Date  . Abnormality of gait 03/25/2013  . Adrenal mass, left ([place])   . Anemia    iron deficient post  2 unit txfsn 2009, normal endo/colonoscopy by [facility]  . Arthritis   . Atypical chest pain    a. 04/1986 Cath ([facility]): nl cors, EF 65%; b. 07/2012 Cath [facility]): LM nl, LAD/LCX/RCA w/ min irregs; c. 07/2016 MV [facility]): "Equivocal" w/ small inf wall rev defect and hyperdynamic LV contraction, EF 87%; d. 08/2016 Cardiac CT Ca2+ score [facility]): Ca2+ score 1548.  [name] Cervical spinal stenosis 1994   due to trauma to back ([employer] accident), has intermittent paralysis and parasthesias  . Cervicogenic headache 03/23/2014  . Depression   . Dizziness    chronic dizziness  . DJD (degenerative joint disease)    a. Chronic R shoulder pain pending R shoulder replacement 07/2017.  [name] Esophageal stenosis March 2011   with transietn outlet obstruction by food, cleared by EGD   . Family history of adverse reaction to anesthesia    daughter PONV  . Gastric bypass status for obesity   . Headache(784.0)   . Hypertension   . IBS (irritable bowel syndrome)   . Left bundle branch block (LBBB)    a. Intermittently present - likely rate related. - patient denies  . Obesity   . Obstructive sleep apnea    using CPAP  . 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  Restless leg syndrome   . Rotator cuff arthropathy, right 08/13/2017  . Syncope and collapse 03/12/2014  . Type II diabetes mellitus ([place])     Past Surgical History:  Procedure Laterality Date  . ABDOMINAL HYSTERECTOMY    . APPENDECTOMY    . COLONOSCOPY    . DIAPHRAGMATIC HERNIA REPAIR  2015  . ESOPHAGEAL DILATION     multiple  . ESOPHAGOGASTRODUODENOSCOPY    . [place]  . GALLBLADDER SURGERY  resection  . GASTRIC BYPASS    . GASTRIC BYPASS  2000, 2005   Dr. Debroah Loop  . [facility] IMPLANT PLACEMENT  April 2013   [place]  . JOINT REPLACEMENT  2007    bilateral knee. [name],  [name]  . PANNICULECTOMY  06/16/2019  . PANNICULECTOMY N/A 06/16/2019   Procedure: PANNICULECTOMY;  Surgeon: [name], MD;  Location: [place];  Service: Plastics;  Laterality: N/A;  3 hours, please  . REVERSE SHOULDER ARTHROPLASTY Right 08/13/2017   Procedure: REVERSE RIGHT SHOULDER ARTHROPLASTY;  Surgeon: [name], MD;  Location: [place];  Service: Orthopedics;  Laterality: Right;  . ROTATOR CUFF REPAIR     right  . [place]  . TOTAL ABDOMINAL HYSTERECTOMY W/ BILATERAL SALPINGOOPHORECTOMY  1974  . UMBILICAL HERNIA REPAIR  Aug 11, 2015    Family History  Problem Relation Age of Onset  . Heart disease Father   . Hypertension Father   . Prostate cancer Father   . Stroke Father   . Osteoporosis Father   . Stroke Mother   . Depression Mother   . Headache Mother   . Heart disease Mother   . Thyroid disease Mother   . Hypertension Mother   . Diabetes Daughter   . Heart disease Daughter   . Hypertension Daughter   . Hypertension Son     SOCIAL HX:  reports that she has never smoked. She has never used smokeless tobacco. She reports that she does not drink alcohol or use drugs.   Current Outpatient Medications:  .  acetaminophen (TYLENOL) 650 MG CR tablet, Take 650 mg by mouth every 8 (eight) hours as needed for pain., Disp: , Rfl:  .  albuterol (VENTOLIN HFA) 108 (90 Base) MCG/ACT inhaler, Inhale 2 puffs into the lungs every 8 (eight) hours as needed for wheezing or shortness of breath., Disp: 8 g, Rfl: 2 .  aspirin EC 81 MG tablet, Take 1 tablet (81 mg total) by mouth daily., Disp: 90 tablet, Rfl: 3 .  benzonatate (TESSALON) 200 MG capsule, Take 1 capsule (200 mg total) by mouth 2 (two) times daily as needed for cough., Disp: 30 capsule, Rfl: 0 .  Blood Glucose Monitoring Suppl (ONETOUCH VERIO) w/Device KIT, 1 Device by Does not apply route daily. Use to test blood glucose once daily. Dx code = E11.9, Disp: 1 kit, Rfl: 0 .   buPROPion (WELLBUTRIN XL) 150 MG 24 hr tablet, Take 1 tablet (150 mg total) by mouth daily., Disp: 90 tablet, Rfl: 1 .  buPROPion (WELLBUTRIN) 75 MG tablet, TAKE 1-2 TABLETS (75-150 MG TOTAL) BY MOUTH SEE ADMIN INSTRUCTIONS. TAKE 2 TABLETS IN THE MORNING AND 1 TABLET IN THE AFTERNOON, Disp: 270 tablet, Rfl: 1 .  butalbital-acetaminophen-caffeine (FIORICET) 50-325-40 MG tablet, Take 1 tablet by mouth every 6 (six) hours as needed for headache., Disp: 90 tablet, Rfl: 5 .  cholestyramine light (PREVALITE) 4 g packet, Take 4 g by mouth 3 (three) times daily as needed (diarrhea). ,  Disp: , Rfl:  .  citalopram (CELEXA) 10 MG tablet, Take 1 tablet (10 mg total) by mouth daily. (Patient taking differently: Take 10 mg by mouth every evening. ), Disp: 90 tablet, Rfl: 3 .  clotrimazole-betamethasone (LOTRISONE) cream, Apply 1 application topically 2 (two) times daily. (Patient taking differently: Apply 1 application topically 2 (two) times daily as needed (rash). ), Disp: 45 g, Rfl: 3 .  cyanocobalamin (,VITAMIN B-12,) 1000 MCG/ML injection, Inject 1 ml (1000 mcg ) IM weekly x 4,  Then monthly thereafter (Patient taking differently: Inject 1,000 mcg into the muscle every 30 (thirty) days. On the 12th day), Disp: 110 mL, Rfl: 0 .  diazepam (VALIUM) 5 MG tablet, Take 1 tablet (5 mg total) by mouth at bedtime as needed for anxiety. (Patient taking differently: Take 5 mg by mouth at bedtime. ), Disp: 30 tablet, Rfl: 5 .  dicyclomine (BENTYL) 10 MG capsule, Take 1 capsule (10 mg total) by mouth 4 (four) times daily -  before meals and at bedtime., Disp: 360 capsule, Rfl: 3 .  diphenhydrAMINE (BENADRYL) 25 MG tablet, Take 50 mg by mouth daily as needed for allergies., Disp: , Rfl:  .  Eszopiclone 3 MG TABS, Take 1 tablet (3 mg total) by mouth at bedtime., Disp: 90 tablet, Rfl: 1 .  ibuprofen (ADVIL) 600 MG tablet, Take 600 mg by mouth every 6 (six) hours as needed for moderate pain. , Disp: , Rfl:  .  Insulin Glargine  (LANTUS SOLOSTAR) 100 UNIT/ML Solostar Pen, Inject 11 Units into the skin daily. (Patient taking differently: Inject 10-13 Units into the skin daily. ), Disp: 5 pen, Rfl: 3 .  Insulin Pen Needle 31G X 5 MM MISC, Use to inject insulin daily, Disp: 30 each, Rfl: 1 .  Lancet Devices (ONE TOUCH DELICA LANCING DEV) MISC, Use to test blood glucose once daily. Dx code = E11.9, Disp: 1 each, Rfl: 0 .  levETIRAcetam (KEPPRA) 750 MG tablet, Take 1 tablet (750 mg total) by mouth at bedtime. (Patient taking differently: Take 750 mg by mouth at bedtime as needed (restless legs). ), Disp: 90 tablet, Rfl: 3 .  NONFORMULARY OR COMPOUNDED ITEM, [facility]  Combination Pain Cream -  Baclofen 2%, Doxepin 5%, Gabapentin 6%, Topiramate 2%, Pentoxifylline 3% Apply 1-2 grams to affected area 3-4 times daily Qty. 120 gm 3 refills, Disp: 1 each, Rfl: 3 .  nystatin (NYAMYC) powder, Apply 1 g topically 4 (four) times daily as needed (rash). , Disp: , Rfl:  .  ondansetron (ZOFRAN) 4 MG tablet, Take 1 tablet (4 mg total) by mouth every 8 (eight) hours as needed for nausea or vomiting., Disp: 20 tablet, Rfl: 0 .  ONETOUCH DELICA LANCETS FINE MISC, Use to test blood sguar once daily. Dx code = E11.9, Disp: 100 each, Rfl: 3 .  ONETOUCH VERIO test strip, USE TO TEST BLOOD SUGAR ONCE DAILY. DX CODE = E11.9, Disp: 100 strip, Rfl: 12 .  OVER THE COUNTER MEDICATION, Apply 1 application topically daily as needed (pain). [place], Disp: , Rfl:  .  pregabalin (LYRICA) 100 MG capsule, Take 100 mg by mouth at bedtime as needed (leg cramps). , Disp: , Rfl:  .  rosuvastatin (CRESTOR) 10 MG tablet, Take 1 tablet (10 mg total) by mouth daily. (Patient taking differently: Take 10 mg by mouth every evening. ), Disp: 90 tablet, Rfl: 3 .  Syringe/Needle, Disp, (SYRINGE 3CC/25GX1") 25G X 1" 3 ML MISC, Use for b12 injections, Disp: 50 each, Rfl:  0 .  traMADol (ULTRAM) 50 MG tablet, Take 0.5 tablets (25 mg total) by mouth every 12 (twelve) hours  as needed for severe pain. (Patient taking differently: Take 50 mg by mouth every 12 (twelve) hours as needed for severe pain. ), Disp: 30 tablet, Rfl: 0 .  umeclidinium-vilanterol (ANORO ELLIPTA) 62.5-25 MCG/INH AEPB, Inhale 1 puff into the lungs daily., Disp: 1 each, Rfl: 12 .  amoxicillin (AMOXIL) 500 MG capsule, Take 1 capsule (500 mg total) by mouth 3 (three) times daily., Disp: 30 capsule, Rfl: 0 .  ondansetron (ZOFRAN ODT) 4 MG disintegrating tablet, Take 1 tablet (4 mg total) by mouth every 8 (eight) hours as needed for nausea or vomiting., Disp: 20 tablet, Rfl: 0 .  pantoprazole (PROTONIX) 40 MG tablet, Take 1 tablet (40 mg total) by mouth daily., Disp: 30 tablet, Rfl: 3  EXAM:   General impression: alert, cooperative and articulate.  No signs of being in distress  Lungs: speech is fluent sentence length suggests that patient is not short of breath and not punctuated by cough, sneezing or sniffing. [name]   Psych: affect normal.  speech is articulate and non pressured . ASSESSMENT AND PLAN:  Discussed the following assessment and plan:  Acute bronchitis, unspecified organism - Plan: albuterol (VENTOLIN HFA) 108 (90 Base) MCG/ACT inhaler  Acute non-recurrent maxillary sinusitis  Chronic gastritis without bleeding, unspecified gastritis type  Sinusitis, acute, maxillary Adding amoxicillin 500 mg tid. Continue benzonotate,  Albuterol MDI prn chest tightness,   No steroids given recent surgery and lack of any wheezing on exam, .  Probiotic advised   Gastritis Suggested by current symptoms of postprandial nausea without vomiting.  Adding protonix and prn zofran     I discussed the assessment and treatment plan with the patient. The patient was provided an opportunity to ask questions and all were answered. The patient agreed with the plan and demonstrated an understanding of the instructions.   The patient was advised to call back or seek an in-person evaluation if the symptoms worsen or  if the condition fails to improve as anticipated.    I provided  30 minutes of non-face-to-face time during this encounter reviewing patient's current problems and past procedures/imaging studies, providing counseling on the above mentioned problems , and coordination  of care .   [name], MD

## 2019-08-07 NOTE — Assessment & Plan Note (Signed)
Adding amoxicillin 500 mg tid. Continue benzonotate,  Albuterol MDI prn chest tightness,   No steroids given recent surgery and lack of any wheezing on exam, .  Probiotic advised

## 2019-08-07 NOTE — Assessment & Plan Note (Signed)
Suggested by current symptoms of postprandial nausea without vomiting.  Adding protonix and prn zofran

## 2019-08-07 NOTE — Assessment & Plan Note (Signed)
She has been repeatedly tested and negative, she is isolating from infected family.

## 2019-08-11 DIAGNOSIS — Z794 Long term (current) use of insulin: Secondary | ICD-10-CM | POA: Diagnosis not present

## 2019-08-11 DIAGNOSIS — K580 Irritable bowel syndrome with diarrhea: Secondary | ICD-10-CM | POA: Diagnosis not present

## 2019-08-11 DIAGNOSIS — Z4801 Encounter for change or removal of surgical wound dressing: Secondary | ICD-10-CM | POA: Diagnosis not present

## 2019-08-11 DIAGNOSIS — Z48817 Encounter for surgical aftercare following surgery on the skin and subcutaneous tissue: Secondary | ICD-10-CM | POA: Diagnosis not present

## 2019-08-11 DIAGNOSIS — F339 Major depressive disorder, recurrent, unspecified: Secondary | ICD-10-CM | POA: Diagnosis not present

## 2019-08-11 DIAGNOSIS — E1140 Type 2 diabetes mellitus with diabetic neuropathy, unspecified: Secondary | ICD-10-CM | POA: Diagnosis not present

## 2019-08-12 ENCOUNTER — Telehealth: Payer: Self-pay | Admitting: Plastic Surgery

## 2019-08-12 NOTE — Telephone Encounter (Signed)

## 2019-08-13 ENCOUNTER — Ambulatory Visit (INDEPENDENT_AMBULATORY_CARE_PROVIDER_SITE_OTHER): Payer: Medicare Other | Admitting: Surgical

## 2019-08-13 ENCOUNTER — Encounter: Payer: Self-pay | Admitting: Surgical

## 2019-08-13 ENCOUNTER — Other Ambulatory Visit: Payer: Self-pay

## 2019-08-13 VITALS — BP 151/63 | HR 70 | Temp 97.1°F | Ht 64.0 in | Wt 250.6 lb

## 2019-08-13 DIAGNOSIS — Z9889 Other specified postprocedural states: Secondary | ICD-10-CM

## 2019-08-13 DIAGNOSIS — E65 Localized adiposity: Secondary | ICD-10-CM

## 2019-08-13 NOTE — Progress Notes (Signed)
Mrs. Denise Sharp is a 74 year old female here for follow-up after panniculectomy on 06/16/2019.  In regards to her surgery, she is doing really well.  Her incision overall is well-healed.  She does have a small wound at the right lateral aspect of her panniculectomy incision, it is approximately 1 x 1 cm in scabbing.  It does not appear infected.  There is no periwound erythema.  She reports that she has been getting assistance still from home health nurses with cooking, bathing, dressing.  She would like to restart PT. she has been wearing her spanks daily.  She feels as if she is swollen in her abdomen, she reports it is worse after meals.  She does not have any constipation.  In fact she reports she is having multiple bowel movements per day, this is normal for her.  She does have some tenderness along her right midline abdomen, near her cholecystectomy incision.  She recently saw her PCP via virtual visit.  She is planning to see her PCP next Friday for in person evaluation. She reports that she is having some shortness of breath, but is not symptomatic at this time. She denies any shortness of breath at night, but reports that she sleeps in a reclined position in her sleep number bed.   No f/c/n/v BP (!) 151/63 (BP Location: Left Arm, Patient Position: Sitting, Cuff Size: Normal)   Pulse 70   Temp (!) 97.1 F (36.2 C) (Temporal)   Ht 5\' 4"  (1.626 m)   Wt 250 lb 9.6 oz (113.7 kg)   SpO2 97%   BMI 43.02 kg/m  Chaperone present  No abdominal fluid wave on exam.  No abdominal hernia noted, exam limited by abdominal adipose tissue.  No seroma noted.  Incision is well-healed, C/D/I, no periincisional erythema. No leg swelling noted.   TTP along right abdomen, inferior to cholecystomy incision.  Plan:  Resent referral for PT to assist patient with increasing strength.  Referral to healthy weight/wellness sent.  Recommend patient follow-up with PCP in regards to abdominal concerns, do not  feel as if this is associated with surgical intervention as we did not undermine tissue near the bellybutton during operation.  She is having a lot of pain along the right cholecystectomy scar, possible hernia, possible scarring due to multiple surgeries including laparoscopies, gastric bypass, cholecystectomy surgeries, appendectomy.  In regards to her panniculectomy, she is doing really well.  Her incision is well-healed, she has no restrictions.

## 2019-08-14 ENCOUNTER — Ambulatory Visit: Payer: Medicare Other | Admitting: Internal Medicine

## 2019-08-21 ENCOUNTER — Ambulatory Visit (INDEPENDENT_AMBULATORY_CARE_PROVIDER_SITE_OTHER): Payer: Medicare Other | Admitting: Internal Medicine

## 2019-08-21 ENCOUNTER — Encounter: Payer: Self-pay | Admitting: Internal Medicine

## 2019-08-21 ENCOUNTER — Other Ambulatory Visit: Payer: Self-pay

## 2019-08-21 VITALS — Ht 64.0 in | Wt 244.0 lb

## 2019-08-21 DIAGNOSIS — E1143 Type 2 diabetes mellitus with diabetic autonomic (poly)neuropathy: Secondary | ICD-10-CM

## 2019-08-21 DIAGNOSIS — Z794 Long term (current) use of insulin: Secondary | ICD-10-CM

## 2019-08-21 DIAGNOSIS — R140 Abdominal distension (gaseous): Secondary | ICD-10-CM

## 2019-08-21 DIAGNOSIS — E1142 Type 2 diabetes mellitus with diabetic polyneuropathy: Secondary | ICD-10-CM | POA: Diagnosis not present

## 2019-08-21 DIAGNOSIS — R809 Proteinuria, unspecified: Secondary | ICD-10-CM | POA: Diagnosis not present

## 2019-08-21 DIAGNOSIS — E1129 Type 2 diabetes mellitus with other diabetic kidney complication: Secondary | ICD-10-CM

## 2019-08-21 DIAGNOSIS — J0100 Acute maxillary sinusitis, unspecified: Secondary | ICD-10-CM

## 2019-08-21 DIAGNOSIS — E65 Localized adiposity: Secondary | ICD-10-CM

## 2019-08-21 DIAGNOSIS — R1010 Upper abdominal pain, unspecified: Secondary | ICD-10-CM

## 2019-08-21 DIAGNOSIS — K591 Functional diarrhea: Secondary | ICD-10-CM

## 2019-08-21 MED ORDER — IPRATROPIUM BROMIDE 0.06 % NA SOLN: 2.0000 | Freq: Four times a day (QID) | NASAL | 12 refills | Status: DC

## 2019-08-21 MED ORDER — FREESTYLE LIBRE 14 DAY SENSOR MISC: 6 refills | Status: DC

## 2019-08-21 MED ORDER — DIPHENOXYLATE-ATROPINE 2.5-0.025 MG PO TABS: 1.0000 | ORAL_TABLET | Freq: Four times a day (QID) | ORAL | 0 refills | Status: DC | PRN

## 2019-08-21 MED ORDER — FREESTYLE LIBRE 14 DAY READER DEVI: 1.0000 | Freq: Four times a day (QID) | 6 refills | Status: DC

## 2019-08-21 NOTE — Progress Notes (Signed)
Telephone Note  This visit type was conducted due to national recommendations for restrictions regarding the COVID-19 pandemic (e.g. social distancing).  This format is felt to be most appropriate for this patient at this time.  All issues noted in this document were discussed and addressed.  No physical exam was performed (except for noted visual exam findings with Video Visits).   I connected with@ on 08/21/19 at 11:00 AM EST by  telephone and verified that I am speaking with the correct person using two identifiers. Location patient: home Location provider: work or home office Persons participating in the virtual visit: patient, provider  I discussed the limitations, risks, security and privacy concerns of performing an evaluation and management service by telephone and the availability of in person appointments. I also discussed with the patient that there may be a patient responsible charge related to this service. The patient expressed understanding and agreed to proceed.  Reason for visit: follow up  On multiple issues   HPI:  persistent recurrent belly pain  Not in area of surgery (recemt pannectomy) .   belly is diffusely sore and feels bloated,  Worse for seveal hours after eating   . Has had solid BM on a daily basis but also has daily loose stools with incontinence  Which is chronic and in the past did improved with use of Lomotil   Persistent nasal drainage and cough.  Finished amoxicillin and sudafed PE.  No fevers,   Facial pain or ear pain   Resuming PT Stopping home health aides due to [place] to shower alone . Trouble wiping her bottom after using bathroom due tochronic recurrent loose stools/diarrhea .  Relying on [facility] aide or husband for help.  Can't leave house.  Saw podiatry,  Feet ok  Type 2 DM:  Reviewed recent blood sugars and insulin regimen Needs FreeStyle libre monitor due to frequency of checking.   Using 13 to 15 units of insulin  Daily depending on the  previous day's readings.   Trouble bending to put on shoes but the rest of dressing ok.   ROS: See pertinent positives and negatives per HPI.  Past Medical History:  Diagnosis Date  . Abnormality of gait 03/25/2013  . Adrenal mass, left ([place])   . Anemia    iron deficient post  2 unit txfsn 2009, normal endo/colonoscopy by [facility]  . Arthritis   . Atypical chest pain    a. 04/1986 Cath ([facility]): nl cors, EF 65%; b. 07/2012 Cath [facility]): LM nl, LAD/LCX/RCA w/ min irregs; c. 07/2016 [facility]): "Equivocal" w/ small inf wall rev defect and hyperdynamic LV contraction, EF 87%; d. 08/2016 Cardiac CT Ca2+ score [facility]): Ca2+ score 1548.  [name] Cervical spinal stenosis 1994   due to trauma to back ([employer] accident), has intermittent paralysis and parasthesias  . Cervicogenic headache 03/23/2014  . Depression   . Dizziness    chronic dizziness  . DJD (degenerative joint disease)    a. Chronic R shoulder pain pending R shoulder replacement 07/2017.  [name] Esophageal stenosis March 2011   with transietn outlet obstruction by food, cleared by EGD   . Family history of adverse reaction to anesthesia    daughter PONV  . Gastric bypass status for obesity   . Headache(784.0)   . Hypertension   . IBS (irritable bowel syndrome)   . Left bundle branch block (LBBB)    a. Intermittently present - likely rate related. - patient denies  . Obesity   .  Obstructive sleep apnea    using CPAP  . 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 Restless leg syndrome   . Rotator cuff arthropathy, right 08/13/2017  . Syncope and collapse 03/12/2014  . Type II diabetes mellitus ([place])     Past Surgical History:  Procedure Laterality Date  . ABDOMINAL HYSTERECTOMY    . APPENDECTOMY    . COLONOSCOPY    . DIAPHRAGMATIC HERNIA REPAIR  2015  . ESOPHAGEAL DILATION     multiple  . ESOPHAGOGASTRODUODENOSCOPY    . [place]  . GALLBLADDER SURGERY  resection  . GASTRIC BYPASS    . GASTRIC BYPASS  2000, 2005   Dr.  Debroah Loop  . [facility] IMPLANT PLACEMENT  April 2013   [place]  . JOINT REPLACEMENT  2007   bilateral knee. [name],  [name]  . PANNICULECTOMY  06/16/2019  . PANNICULECTOMY N/A 06/16/2019   Procedure: PANNICULECTOMY;  Surgeon: [name], MD;  Location: [place];  Service: Plastics;  Laterality: N/A;  3 hours, please  . REVERSE SHOULDER ARTHROPLASTY Right 08/13/2017   Procedure: REVERSE RIGHT SHOULDER ARTHROPLASTY;  Surgeon: [name], MD;  Location: [place];  Service: Orthopedics;  Laterality: Right;  . ROTATOR CUFF REPAIR     right  . [place]  . TOTAL ABDOMINAL HYSTERECTOMY W/ BILATERAL SALPINGOOPHORECTOMY  1974  . UMBILICAL HERNIA REPAIR  Aug 11, 2015    Family History  Problem Relation Age of Onset  . Heart disease Father   . Hypertension Father   . Prostate cancer Father   . Stroke Father   . Osteoporosis Father   . Stroke Mother   . Depression Mother   . Headache Mother   . Heart disease Mother   . Thyroid disease Mother   . Hypertension Mother   . Diabetes Daughter   . Heart disease Daughter   . Hypertension Daughter   . Hypertension Son     SOCIAL HX:  reports that she has never smoked. She has never used smokeless tobacco. She reports that she does not drink alcohol or use drugs.   Current Outpatient Medications:  .  albuterol (VENTOLIN HFA) 108 (90 Base) MCG/ACT inhaler, Inhale 2 puffs into the lungs every 8 (eight) hours as needed for wheezing or shortness of breath., Disp: 8 g, Rfl: 2 .  benzonatate (TESSALON) 200 MG capsule, Take 1 capsule (200 mg total) by mouth 2 (two) times daily as needed for cough., Disp: 30 capsule, Rfl: 0 .  Blood Glucose Monitoring Suppl (ONETOUCH VERIO) w/Device KIT, 1 Device by Does not apply route daily. Use to test blood glucose once daily. Dx code = E11.9, Disp: 1 kit, Rfl: 0 .  buPROPion (WELLBUTRIN XL) 150 MG 24 hr tablet, Take 1 tablet (150 mg total) by mouth daily., Disp: 90 tablet, Rfl: 1 .  buPROPion  (WELLBUTRIN) 75 MG tablet, TAKE 1-2 TABLETS (75-150 MG TOTAL) BY MOUTH SEE ADMIN INSTRUCTIONS. TAKE 2 TABLETS IN THE MORNING AND 1 TABLET IN THE AFTERNOON, Disp: 270 tablet, Rfl: 1 .  butalbital-acetaminophen-caffeine (FIORICET) 50-325-40 MG tablet, Take 1 tablet by mouth every 6 (six) hours as needed for headache., Disp: 90 tablet, Rfl: 5 .  cholestyramine light (PREVALITE) 4 g packet, Take 4 g by mouth 3 (three) times daily as needed (diarrhea). , Disp: , Rfl:  .  citalopram (CELEXA) 10 MG tablet, Take 1 tablet (10 mg total) by mouth daily. (Patient taking differently: Take 10 mg by mouth every evening. ),  Disp: 90 tablet, Rfl: 3 .  clotrimazole-betamethasone (LOTRISONE) cream, Apply 1 application topically 2 (two) times daily. (Patient taking differently: Apply 1 application topically 2 (two) times daily as needed (rash). ), Disp: 45 g, Rfl: 3 .  cyanocobalamin (,VITAMIN B-12,) 1000 MCG/ML injection, Inject 1 ml (1000 mcg ) IM weekly x 4,  Then monthly thereafter (Patient taking differently: Inject 1,000 mcg into the muscle every 30 (thirty) days. On the 12th day), Disp: 110 mL, Rfl: 0 .  diazepam (VALIUM) 5 MG tablet, Take 1 tablet (5 mg total) by mouth at bedtime as needed for anxiety. (Patient taking differently: Take 5 mg by mouth at bedtime. ), Disp: 30 tablet, Rfl: 5 .  dicyclomine (BENTYL) 10 MG capsule, Take 1 capsule (10 mg total) by mouth 4 (four) times daily -  before meals and at bedtime., Disp: 360 capsule, Rfl: 3 .  diphenhydrAMINE (BENADRYL) 25 MG tablet, Take 50 mg by mouth daily as needed for allergies., Disp: , Rfl:  .  Eszopiclone 3 MG TABS, Take 1 tablet (3 mg total) by mouth at bedtime., Disp: 90 tablet, Rfl: 1 .  Insulin Glargine (LANTUS SOLOSTAR) 100 UNIT/ML Solostar Pen, Inject 11 Units into the skin daily. (Patient taking differently: Inject 10-13 Units into the skin daily. ), Disp: 5 pen, Rfl: 3 .  Insulin Pen Needle 31G X 5 MM MISC, Use to inject insulin daily, Disp: 30 each,  Rfl: 1 .  Lancet Devices (ONE TOUCH DELICA LANCING DEV) MISC, Use to test blood glucose once daily. Dx code = E11.9, Disp: 1 each, Rfl: 0 .  levETIRAcetam (KEPPRA) 750 MG tablet, Take 1 tablet (750 mg total) by mouth at bedtime. (Patient taking differently: Take 750 mg by mouth at bedtime as needed (restless legs). ), Disp: 90 tablet, Rfl: 3 .  NONFORMULARY OR COMPOUNDED ITEM, [facility]  Combination Pain Cream -  Baclofen 2%, Doxepin 5%, Gabapentin 6%, Topiramate 2%, Pentoxifylline 3% Apply 1-2 grams to affected area 3-4 times daily Qty. 120 gm 3 refills, Disp: 1 each, Rfl: 3 .  nystatin (NYAMYC) powder, Apply 1 g topically 4 (four) times daily as needed (rash). , Disp: , Rfl:  .  ondansetron (ZOFRAN ODT) 4 MG disintegrating tablet, Take 1 tablet (4 mg total) by mouth every 8 (eight) hours as needed for nausea or vomiting., Disp: 20 tablet, Rfl: 0 .  ondansetron (ZOFRAN) 4 MG tablet, Take 1 tablet (4 mg total) by mouth every 8 (eight) hours as needed for nausea or vomiting., Disp: 20 tablet, Rfl: 0 .  ONETOUCH DELICA LANCETS FINE MISC, Use to test blood sguar once daily. Dx code = E11.9, Disp: 100 each, Rfl: 3 .  ONETOUCH VERIO test strip, USE TO TEST BLOOD SUGAR ONCE DAILY. DX CODE = E11.9, Disp: 100 strip, Rfl: 12 .  OVER THE COUNTER MEDICATION, Apply 1 application topically daily as needed (pain). [place], Disp: , Rfl:  .  pantoprazole (PROTONIX) 40 MG tablet, Take 1 tablet (40 mg total) by mouth daily., Disp: 30 tablet, Rfl: 3 .  pregabalin (LYRICA) 100 MG capsule, Take 100 mg by mouth at bedtime as needed (leg cramps). , Disp: , Rfl:  .  rosuvastatin (CRESTOR) 10 MG tablet, Take 1 tablet (10 mg total) by mouth daily. (Patient taking differently: Take 10 mg by mouth every evening. ), Disp: 90 tablet, Rfl: 3 .  Syringe/Needle, Disp, (SYRINGE 3CC/25GX1") 25G X 1" 3 ML MISC, Use for b12 injections, Disp: 50 each, Rfl: 0 .  umeclidinium-vilanterol (  ANORO ELLIPTA) 62.5-25 MCG/INH AEPB, Inhale  1 puff into the lungs daily., Disp: 1 each, Rfl: 12 .  acetaminophen (TYLENOL) 650 MG CR tablet, Take 650 mg by mouth every 8 (eight) hours as needed for pain., Disp: , Rfl:  .  aspirin EC 81 MG tablet, Take 1 tablet (81 mg total) by mouth daily. (Patient not taking: Reported on 08/21/2019), Disp: 90 tablet, Rfl: 3 .  Continuous Blood Gluc Receiver (FREESTYLE LIBRE 14 DAY READER) DEVI, 1 applicator by Does not apply route 4 (four) times daily., Disp: 1 each, Rfl: 6 .  Continuous Blood Gluc Sensor (FREESTYLE LIBRE 14 DAY SENSOR) MISC, Use to test blood sugar 4 times daily, Disp: 1 each, Rfl: 6 .  diphenoxylate-atropine (LOMOTIL) 2.5-0.025 MG tablet, Take 1 tablet by mouth 4 (four) times daily as needed for diarrhea or loose stools., Disp: 60 tablet, Rfl: 0 .  ibuprofen (ADVIL) 600 MG tablet, Take 600 mg by mouth every 6 (six) hours as needed for moderate pain. , Disp: , Rfl:  .  ipratropium (ATROVENT) 0.06 % nasal spray, Place 2 sprays into both nostrils 4 (four) times daily., Disp: 15 mL, Rfl: 12 .  traMADol (ULTRAM) 50 MG tablet, Take 0.5 tablets (25 mg total) by mouth every 12 (twelve) hours as needed for severe pain. (Patient not taking: Reported on 08/21/2019), Disp: 30 tablet, Rfl: 0  EXAM:   General impression: alert, cooperative and articulate.  No signs of being in distress  Lungs: speech is fluent sentence length suggests that patient is not short of breath and not punctuated by cough, sneezing or sniffing. [name]   Psych: affect normal.  speech is articulate and non pressured .  Neuro:  awake and interactive with normal mood and affect. Higher cortical functions are normal. Speech is clear without word-finding difficulty or dysarthria.   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  Trial of Atrovent for PND   Diabetes mellitus type II, controlled ([place]) Advised to check post prandials twice dailyso meds can be adjusted.  Free Style [place] monitor ordered .  Has had podiatry follow up  Lab Results  Component Value Date   HGBA1C 7.2 (H) 06/09/2019   Lab Results  Component Value Date   LABMICR Comment 11/11/2014   MICROALBUR 2.8 (H) 06/04/2017   MICROALBUR 9.5 (H) 06/29/2016       Microalbuminuria due to type 2 diabetes mellitus ([place]) Did not tolerate ARB secondary to nausea and hypotension   Abdominal pain Etiology unclear.  Not realted to surgical wound from pannectomy which has healed.  Suspect gastroparesis .  Gastric emptying study ordered   Abdominal pannus S/p panniculectomy on Dec 1.  Recovering at home  Wound is healing well   Diabetic polyneuropathy Managed with special shoes and frequent podiatry follow up  Functional diarrhea Improving since surgery,  But still present,  Resulting in fecal incontinence  .  Trial of Lomotil.     I discussed  the assessment and treatment plan with the patient. The patient was provided an opportunity to ask questions and all were answered. The patient agreed with the plan and demonstrated an understanding of the instructions.   The patient was advised to call back or seek an in-person evaluation if the symptoms worsen or if the condition fails to improve as anticipated.   I provided  25 minutes of non-face-to-face time during this encounter reviewing patient's current problems and past  procedures/imaging studies, providing counseling on the above mentioned problems , and coordination  of care .  [name], MD

## 2019-08-23 DIAGNOSIS — R109 Unspecified abdominal pain: Secondary | ICD-10-CM | POA: Insufficient documentation

## 2019-08-23 DIAGNOSIS — R3989 Other symptoms and signs involving the genitourinary system: Secondary | ICD-10-CM | POA: Insufficient documentation

## 2019-08-23 NOTE — Assessment & Plan Note (Signed)
Symptoms if infection resolved.  Trial of Atrovent for PND

## 2019-08-23 NOTE — Assessment & Plan Note (Signed)
Etiology unclear.  Not realted to surgical wound from pannectomy which has healed.  Suspect gastroparesis .  Gastric emptying study ordered

## 2019-08-23 NOTE — Assessment & Plan Note (Signed)
Did not tolerate ARB secondary to nausea and hypotension

## 2019-08-23 NOTE — Assessment & Plan Note (Signed)
S/p panniculectomy on Dec 1.  Recovering at home  Wound is healing well

## 2019-08-23 NOTE — Assessment & Plan Note (Signed)
Advised to check post prandials twice dailyso meds can be adjusted.  Free Style [place] monitor ordered .  Has had podiatry follow up  Lab Results  Component Value Date   HGBA1C 7.2 (H) 06/09/2019   Lab Results  Component Value Date   LABMICR Comment 11/11/2014   MICROALBUR 2.8 (H) 06/04/2017   MICROALBUR 9.5 (H) 06/29/2016

## 2019-08-23 NOTE — Assessment & Plan Note (Signed)
Managed with special shoes and frequent podiatry follow up

## 2019-08-23 NOTE — Assessment & Plan Note (Addendum)
Improving since surgery,  But still present,  Resulting in fecal incontinence  .  Trial of Lomotil.

## 2019-08-25 ENCOUNTER — Telehealth: Payer: Self-pay | Admitting: *Deleted

## 2019-08-25 ENCOUNTER — Telehealth: Payer: Self-pay | Admitting: Internal Medicine

## 2019-08-25 NOTE — Telephone Encounter (Signed)
They received a form for FREESTYLE LIBRE 14 DAY READER and for FREESTYLE LIBRE 14 DAY SENSOR  They need diagnosis code Z79.4 add

## 2019-08-25 NOTE — Telephone Encounter (Signed)
Received Discharge-Transfer Summary Report on (08/19/19) via fax from [facility] for review.  Given to provider for review.//AB/CMA

## 2019-08-25 NOTE — Telephone Encounter (Signed)
Diagnosis code has been added and form has been re-faxed.

## 2019-08-26 ENCOUNTER — Telehealth: Payer: Self-pay | Admitting: *Deleted

## 2019-08-26 NOTE — Telephone Encounter (Signed)
Received Missed Visit Report via of fax from [facility] for provider to review.  It was reviewed by the provider and signed.//AB/CMA

## 2019-09-09 ENCOUNTER — Encounter: Payer: Self-pay | Admitting: Surgical

## 2019-09-09 ENCOUNTER — Ambulatory Visit: Payer: Medicare Other | Admitting: Surgical

## 2019-09-09 ENCOUNTER — Ambulatory Visit (INDEPENDENT_AMBULATORY_CARE_PROVIDER_SITE_OTHER): Payer: Medicare Other | Admitting: Surgical

## 2019-09-09 ENCOUNTER — Other Ambulatory Visit: Payer: Self-pay

## 2019-09-09 VITALS — BP 155/85 | HR 79 | Temp 98.0°F | Ht 64.0 in | Wt 255.0 lb

## 2019-09-09 DIAGNOSIS — E65 Localized adiposity: Secondary | ICD-10-CM

## 2019-09-09 DIAGNOSIS — Z9889 Other specified postprocedural states: Secondary | ICD-10-CM

## 2019-09-09 NOTE — Progress Notes (Signed)
The patient is a 74 year old female here for follow-up after panniculectomy on 06/16/2019.  Her incision is really well-healed.  She has no wounds.  No sign of infection.  No periincisional erythema. No surgical site swelling noted. No signs of seroma, hematoma, infection.   She reports that taking care of herself is difficult because of her physical and mobile restrictions.  She is unable to get much help from her husband because he also has difficulty with mobility. She reports today was her last day with home health assistance. She reports having a rash between her labia, which she has been using powder for. She reports the rash is due to difficulty with hygiene due to decreased mobility.     She has not had any fevers, chills, nausea, vomiting.   Since undergoing panniculectomy in December patient has had continued weight gain that she is concerned about.  Measured Weights from this office: 07/02/2019 - 111.9 kg/246lb 12.8 oz 07/16/2019 - 112 kg/247 lb 08/13/2019 - 113.7kg/250 lb 9.6 oz 09/09/2019 - 115.7kg/255 lb She reports no change or increase in eating habits. She reports her BM have been better since panniculectomy, reporting more solid stools.   She is overall happy with her results from her panniculectomy, but feels as if she needs more done for her upper abdomen.  BP (!) 155/85 (BP Location: Right Arm, Patient Position: Sitting, Cuff Size: Normal)   Pulse 79   Temp 98 F (36.7 C) (Temporal)   Ht 5\' 4"  (1.626 m)   Wt 255 lb (115.7 kg)   SpO2 95%   BMI 43.77 kg/m  Chaperone present Physical Exam  Constitutional: She is oriented to person, place, and time and well-developed, well-nourished, and in no distress.  HENT:  Head: Normocephalic and atraumatic.  Cardiovascular: Normal rate.  Pulmonary/Chest: Effort normal.  Abdominal: Soft. There is abdominal tenderness (right abdomen over cholecystectomy incision). There is no rebound and no guarding.    Unable to identify any  herniation due to adipose tissue. No fluid wave noted.   Musculoskeletal:        General: No edema. Normal range of motion.  Neurological: She is alert and oriented to person, place, and time.  Skin: Skin is warm and dry. She is not diaphoretic. No erythema.  Psychiatric: Mood and affect normal.    Plan:  In regards to post-op from panniculectomy, Mrs. Denise Sharp is doing well. Incision is well healed. No sign of infection, seroma, hematoma. She does have some tenderness along R lateral incision where JP drain was in place - no overt signs of any pathologic causes - likely irritation of tissue from drain - suspect will resolve with time.  Strongly recommend patient reach out to PCP for continued evaluation of weight gain. I do not see any overt signs of anything concerning at this time, but encourage her to follow up with PCP. I do not feel as if this is associated with her panniculectomy as she has no issues with her lower abdomen, it is mostly her upper abdomen. She is undergoing GI mobility (ordered by PCP for abdominal pain) study tomorrow that may provide some insight. She may need a CT for further eval of any possible herniations or intra-abdominal causes. Unsure etiology at this time, exam limited by thick adipose tissue of abdomen.  Patient may benefit from PT for rehabilitation. She previously had PT assisting her with rehabilitation, but this was d/c'ed due to her post-op restrictions. Recommend speaking with PCP in regards to this matter as she  is now ~ 3 months post-op and is doing well in regards to her panniculectomy.  I have previously referred patient to healthy weight and wellness. Per [facility], patient spoke with them and reported she would call back after a stomach emptying appointment if she decided she would like to move forward with [place] assistance.   Follow up as needed. In regards to post-operative course and her panniculectomy, she is well healed and is doing well. No further  scheduled follow up necessary. Patient understands she can call us if she has any issues that arise with her panniculectomy incision or any other post-operative concerns.

## 2019-09-10 ENCOUNTER — Encounter
Admission: RE | Admit: 2019-09-10 | Discharge: 2019-09-10 | Disposition: A | Discharge status: Home / Self Care | Payer: Medicare Other | Source: Ambulatory Visit | Attending: Internal Medicine | Admitting: Internal Medicine

## 2019-09-10 ENCOUNTER — Other Ambulatory Visit: Payer: Self-pay

## 2019-09-10 DIAGNOSIS — R140 Abdominal distension (gaseous): Secondary | ICD-10-CM | POA: Insufficient documentation

## 2019-09-10 DIAGNOSIS — R197 Diarrhea, unspecified: Secondary | ICD-10-CM | POA: Diagnosis not present

## 2019-09-10 MED ORDER — TECHNETIUM TC 99M SULFUR COLLOID
2.0000 | Freq: Once | INTRAVENOUS | Status: AC | PRN
  Administered 2019-09-10: 2.478 via ORAL

## 2019-09-14 DIAGNOSIS — D649 Anemia, unspecified: Secondary | ICD-10-CM | POA: Diagnosis not present

## 2019-09-14 DIAGNOSIS — Z6841 Body Mass Index (BMI) 40.0 and over, adult: Secondary | ICD-10-CM | POA: Diagnosis not present

## 2019-09-14 DIAGNOSIS — I10 Essential (primary) hypertension: Secondary | ICD-10-CM | POA: Diagnosis not present

## 2019-09-14 DIAGNOSIS — E1169 Type 2 diabetes mellitus with other specified complication: Secondary | ICD-10-CM | POA: Diagnosis not present

## 2019-09-14 DIAGNOSIS — F0390 Unspecified dementia without behavioral disturbance: Secondary | ICD-10-CM | POA: Diagnosis not present

## 2019-09-14 DIAGNOSIS — E11610 Type 2 diabetes mellitus with diabetic neuropathic arthropathy: Secondary | ICD-10-CM | POA: Diagnosis not present

## 2019-09-14 DIAGNOSIS — R2689 Other abnormalities of gait and mobility: Secondary | ICD-10-CM | POA: Diagnosis not present

## 2019-09-14 DIAGNOSIS — I872 Venous insufficiency (chronic) (peripheral): Secondary | ICD-10-CM | POA: Diagnosis not present

## 2019-09-14 DIAGNOSIS — R809 Proteinuria, unspecified: Secondary | ICD-10-CM | POA: Diagnosis not present

## 2019-09-14 DIAGNOSIS — M4802 Spinal stenosis, cervical region: Secondary | ICD-10-CM | POA: Diagnosis not present

## 2019-09-14 DIAGNOSIS — E1142 Type 2 diabetes mellitus with diabetic polyneuropathy: Secondary | ICD-10-CM | POA: Diagnosis not present

## 2019-09-14 DIAGNOSIS — Z9884 Bariatric surgery status: Secondary | ICD-10-CM | POA: Diagnosis not present

## 2019-09-14 DIAGNOSIS — F329 Major depressive disorder, single episode, unspecified: Secondary | ICD-10-CM | POA: Diagnosis not present

## 2019-09-14 DIAGNOSIS — E113393 Type 2 diabetes mellitus with moderate nonproliferative diabetic retinopathy without macular edema, bilateral: Secondary | ICD-10-CM | POA: Diagnosis not present

## 2019-09-14 DIAGNOSIS — Z794 Long term (current) use of insulin: Secondary | ICD-10-CM | POA: Diagnosis not present

## 2019-09-17 DIAGNOSIS — E1169 Type 2 diabetes mellitus with other specified complication: Secondary | ICD-10-CM | POA: Diagnosis not present

## 2019-09-17 DIAGNOSIS — R2689 Other abnormalities of gait and mobility: Secondary | ICD-10-CM | POA: Diagnosis not present

## 2019-09-17 DIAGNOSIS — Z6841 Body Mass Index (BMI) 40.0 and over, adult: Secondary | ICD-10-CM | POA: Diagnosis not present

## 2019-09-17 DIAGNOSIS — M4802 Spinal stenosis, cervical region: Secondary | ICD-10-CM | POA: Diagnosis not present

## 2019-09-17 DIAGNOSIS — E1142 Type 2 diabetes mellitus with diabetic polyneuropathy: Secondary | ICD-10-CM | POA: Diagnosis not present

## 2019-09-21 DIAGNOSIS — R2689 Other abnormalities of gait and mobility: Secondary | ICD-10-CM | POA: Diagnosis not present

## 2019-09-21 DIAGNOSIS — E1169 Type 2 diabetes mellitus with other specified complication: Secondary | ICD-10-CM | POA: Diagnosis not present

## 2019-09-21 DIAGNOSIS — M4802 Spinal stenosis, cervical region: Secondary | ICD-10-CM | POA: Diagnosis not present

## 2019-09-21 DIAGNOSIS — Z6841 Body Mass Index (BMI) 40.0 and over, adult: Secondary | ICD-10-CM | POA: Diagnosis not present

## 2019-09-21 DIAGNOSIS — E1142 Type 2 diabetes mellitus with diabetic polyneuropathy: Secondary | ICD-10-CM | POA: Diagnosis not present

## 2019-09-23 DIAGNOSIS — R2689 Other abnormalities of gait and mobility: Secondary | ICD-10-CM | POA: Diagnosis not present

## 2019-09-23 DIAGNOSIS — E1169 Type 2 diabetes mellitus with other specified complication: Secondary | ICD-10-CM | POA: Diagnosis not present

## 2019-09-23 DIAGNOSIS — M4802 Spinal stenosis, cervical region: Secondary | ICD-10-CM | POA: Diagnosis not present

## 2019-09-23 DIAGNOSIS — E1142 Type 2 diabetes mellitus with diabetic polyneuropathy: Secondary | ICD-10-CM | POA: Diagnosis not present

## 2019-09-23 DIAGNOSIS — Z6841 Body Mass Index (BMI) 40.0 and over, adult: Secondary | ICD-10-CM | POA: Diagnosis not present

## 2019-09-29 DIAGNOSIS — E1142 Type 2 diabetes mellitus with diabetic polyneuropathy: Secondary | ICD-10-CM | POA: Diagnosis not present

## 2019-09-29 DIAGNOSIS — R2689 Other abnormalities of gait and mobility: Secondary | ICD-10-CM | POA: Diagnosis not present

## 2019-09-29 DIAGNOSIS — E1169 Type 2 diabetes mellitus with other specified complication: Secondary | ICD-10-CM | POA: Diagnosis not present

## 2019-09-29 DIAGNOSIS — M4802 Spinal stenosis, cervical region: Secondary | ICD-10-CM | POA: Diagnosis not present

## 2019-09-29 DIAGNOSIS — Z6841 Body Mass Index (BMI) 40.0 and over, adult: Secondary | ICD-10-CM | POA: Diagnosis not present

## 2019-10-01 DIAGNOSIS — M4802 Spinal stenosis, cervical region: Secondary | ICD-10-CM | POA: Diagnosis not present

## 2019-10-01 DIAGNOSIS — Z6841 Body Mass Index (BMI) 40.0 and over, adult: Secondary | ICD-10-CM | POA: Diagnosis not present

## 2019-10-01 DIAGNOSIS — R2689 Other abnormalities of gait and mobility: Secondary | ICD-10-CM | POA: Diagnosis not present

## 2019-10-01 DIAGNOSIS — E1142 Type 2 diabetes mellitus with diabetic polyneuropathy: Secondary | ICD-10-CM | POA: Diagnosis not present

## 2019-10-01 DIAGNOSIS — E1169 Type 2 diabetes mellitus with other specified complication: Secondary | ICD-10-CM | POA: Diagnosis not present

## 2019-10-02 ENCOUNTER — Telehealth: Payer: Self-pay | Admitting: Internal Medicine

## 2019-10-02 DIAGNOSIS — Z794 Long term (current) use of insulin: Secondary | ICD-10-CM

## 2019-10-02 DIAGNOSIS — E1143 Type 2 diabetes mellitus with diabetic autonomic (poly)neuropathy: Secondary | ICD-10-CM

## 2019-10-02 MED ORDER — FREESTYLE LIBRE 14 DAY SENSOR MISC: 6 refills | Status: DC

## 2019-10-02 MED ORDER — FREESTYLE LIBRE 14 DAY READER DEVI: 1.0000 | Freq: Four times a day (QID) | 6 refills | Status: DC

## 2019-10-02 NOTE — Telephone Encounter (Signed)
Denise Sharp has been sent in. Called pt and she stated that she isn't sure if medicare will cover the [facility] because she does not have medicare drug coverage. Told pt that I would reach out to the pharmacy and find out if it is covered or not and let her know by Monday.

## 2019-10-02 NOTE — Addendum Note (Signed)
Addended by: Adair Laundry on: 10/02/2019 03:47 PM   Modules accepted: Orders

## 2019-10-02 NOTE — Telephone Encounter (Signed)
Pt needs the RX for Continuous Blood Gluc Receiver (FREESTYLE LIBRE 14 DAY READER and the Sensor [facility]  Pt also would like a call back about this

## 2019-10-12 DIAGNOSIS — E1142 Type 2 diabetes mellitus with diabetic polyneuropathy: Secondary | ICD-10-CM | POA: Diagnosis not present

## 2019-10-12 DIAGNOSIS — Z6841 Body Mass Index (BMI) 40.0 and over, adult: Secondary | ICD-10-CM | POA: Diagnosis not present

## 2019-10-12 DIAGNOSIS — M4802 Spinal stenosis, cervical region: Secondary | ICD-10-CM | POA: Diagnosis not present

## 2019-10-12 DIAGNOSIS — E1169 Type 2 diabetes mellitus with other specified complication: Secondary | ICD-10-CM | POA: Diagnosis not present

## 2019-10-12 DIAGNOSIS — R2689 Other abnormalities of gait and mobility: Secondary | ICD-10-CM | POA: Diagnosis not present

## 2019-10-15 ENCOUNTER — Telehealth: Payer: Self-pay | Admitting: Internal Medicine

## 2019-10-15 NOTE — Telephone Encounter (Signed)
Spoke with pt and she stated that she still has not received her libre sensor yet. Pt stated that she is going to the pharmacy tomorrow and will call us back on Monday to let us know what the pharmacy tells her about it.

## 2019-10-15 NOTE — Telephone Encounter (Signed)
Attempted to call pt. Mail box is full.  

## 2019-10-15 NOTE — Telephone Encounter (Signed)
Pt called and said she was sitting with her insurance agent and they are trying to do an appeal because she was denied insurance. Pt said 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 She said she was not aware she had this and is needing a call asap. I let her know I didn't know how soon she would get a call from [place] but I will send the message.

## 2019-10-15 NOTE — Telephone Encounter (Signed)
Called the pt and she did not answer, her mailbox is full, [facility]

## 2019-10-15 NOTE — Telephone Encounter (Signed)
Pt was returning your call. If you can not reach on cell, call [phone]

## 2019-10-19 NOTE — Telephone Encounter (Signed)
Spoke with the pt  She states that her recent insurance policy was denied due to pulmonary nodules noted in her medical record  She states did not know she had these  I advised that she was referred here in 2014 to see Dr Melvyn Novas regarding these and we f/u on these with serial CT Chest  She states wants to have CDY review her chart and make sure nothing further needs to be done about these  She also states dx with PNA approx a month ago and still having residual cough and DOE  She has cough with pink to yellow sputum  No f/c/s  I offered televisit, but she refused Wants to be seen by Dr Annamaria Boots  She is aware he is out of the office until 10/22/19 and okay with waiting response until then  Please advise thanks

## 2019-10-20 NOTE — Telephone Encounter (Signed)
Pt said she still doesn't have the meter. She would like a call back. She said it has been 4 weeks and she really needs to check her glucose.

## 2019-10-20 NOTE — Telephone Encounter (Signed)
Spoke with pt about the diagnosis codes in her chart. Pt stated that she is waiting on a call from [facility] about the pulmonary nodules that is mentioned in her chart.

## 2019-10-22 MED ORDER — BLOOD GLUCOSE METER KIT: PACK | 0 refills | Status: DC

## 2019-10-22 NOTE — Addendum Note (Signed)
Addended by: Adair Laundry on: 10/22/2019 04:06 PM   Modules accepted: Orders

## 2019-10-22 NOTE — Telephone Encounter (Signed)
Spoke with pt to let her know that I [facility] and he stated that Medicare part D will cover a glucometer once every three years. He stated that the pt had already had one filled in the last three years. Pt gave a verbal understanding and stated that her husband was on the way to the pharmacy and that they would probably just have to pay for one out of pocket.

## 2019-10-22 NOTE — Telephone Encounter (Signed)
Ok to work her in for [employer] with me as available

## 2019-10-23 NOTE — Telephone Encounter (Signed)
Called and spoke with pt letting her know that CY said it was okay to work her in for an appt and she verbalized understanding. Pt has been scheduled for an appt with CY 4/15. Nothing further needed.

## 2019-10-28 ENCOUNTER — Telehealth: Payer: Self-pay | Admitting: Internal Medicine

## 2019-10-28 NOTE — Telephone Encounter (Signed)
Called and spoke with pt and stated to her since she has had a low grade temp and taking tylenol to help with it and also having increased SOB, we cannot have her come into the office for her appt. Stated to her that we could keep her appt as scheduled but stated to her that we would need to make it a virtual visit instead. Pt verbalized understanding. I have changed pt's appt tomorrow with Dr. Annamaria Boots to a televisit. Nothing further needed.

## 2019-10-29 ENCOUNTER — Ambulatory Visit (INDEPENDENT_AMBULATORY_CARE_PROVIDER_SITE_OTHER): Payer: Medicare Other | Admitting: Internal Medicine

## 2019-10-29 ENCOUNTER — Other Ambulatory Visit: Payer: Self-pay

## 2019-10-29 DIAGNOSIS — Z9989 Dependence on other enabling machines and devices: Secondary | ICD-10-CM | POA: Diagnosis not present

## 2019-10-29 DIAGNOSIS — R0989 Other specified symptoms and signs involving the circulatory and respiratory systems: Secondary | ICD-10-CM | POA: Diagnosis not present

## 2019-10-29 DIAGNOSIS — R0600 Dyspnea, unspecified: Secondary | ICD-10-CM

## 2019-10-29 DIAGNOSIS — R0609 Other forms of dyspnea: Secondary | ICD-10-CM

## 2019-10-29 DIAGNOSIS — R918 Other nonspecific abnormal finding of lung field: Secondary | ICD-10-CM

## 2019-10-29 DIAGNOSIS — G4733 Obstructive sleep apnea (adult) (pediatric): Secondary | ICD-10-CM

## 2019-10-29 NOTE — Patient Instructions (Signed)
Order- schedule CTa chest     Dx suspect pulmonary embolism, Dyspnea on exertion            Order lab- BMET  Order- DME [employer]- please increase CPAP auto range to 4- 12, continue mask of choice, humidifier, supplies, airView/ card  Script sent for refill of tessalon perles for cough  Script sent for albuterol "rescue" inhaler   Inhale 2 puffs every 6 hours if needed for shortness of breath  Continue using your Anoro inhaler 1 puff, every day  Please call if we can help

## 2019-10-29 NOTE — Progress Notes (Signed)
HPI F never smoker followed for OSA, Hypersomnia, Insomnia complicated by IBS NPSG 4/00/86 at [facility]/ Sleep [facility]   Mild OSA, AHI 13.2/ hr, weight 218 lbs   CPAP 6 NPSG 05/22/15- CPAP Titration study- Optimal 7 cwp,  MSLT 05/23/15 ( she had taken Lyrica the night before during CPAP titration against protocol). 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 Walk Test 02/19/2019- Min sat 97%, Max HR 117. PFT 05/04/19- WNL FEV1/FVC 88%, TLC 96%, DLCO 85%. 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 Melvyn Novas for pulmonary, complicated by DM 2/peripheral neuropathy, degenerative disc disease, pulmonary nodules, DVT 2017, CPAP 4-10/ [employer] Download 97% compliance, AHI 1.1/ hr Body weight today- 250 lbs -----pt reports increased cough, shortness of breath, and weight gain in the last 3 months; OSA on CPAP aut 4-10, DME: [employer]; pt states she feels as if her CPAP is "suffocating" her.  COVID swab Neg 02/02/2019 Feels need to take deliberate deep breaths. Hears wheeze occasionally.  Covid careful- discussed.  CXR 10/15/2018  1V IMPRESSION: Mild left basilar atelectasis and/or scarring. No other active cardiopulmonary disease. Walk Test 02/19/2019- Min sat 97%, Max HR 117.  10/29/19- Virtual Visit via Telephone Note  I connected with Denise [place] on 10/29/19 at  4:30 PM EDT by telephone and verified that I am speaking with the correct person using two identifiers.  Location: Patient: [place] Provider: O   I discussed the limitations, risks, security and privacy concerns of performing an evaluation and management service by telephone and the availability of in person appointments. I also discussed with the patient  that there may be a patient responsible charge related to this service. The patient expressed understanding and agreed to proceed.   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 Melvyn Novas for pulmonary, complicated by DM 2/peripheral neuropathy, degenerative disc disease, DVT 2017, CPAP 4-10/ [employer] Download compliance 100%, AHI 1.1/ hr Comfortable with CPAP. Complains that she feels more SOB, mainly DOE, every day since surgery Dec 1 to remove abdominal pannus. Physical activity very limited doe 2 months after surgery, little participation in PT/ rehab. Now can only walk short distances in home.  Denies Covid infection, blood clotting or bleeding or leg pain.  Would like to add a rescue inhaler. Some dry cough and wheeze.  Admits weight gain again since not active. Denies heart disease. Previous imaging in 2014 noted ground glass nodules, but these had resolve as of CT chest 2019.    Observations/Objective: PFT 05/04/2019- WNL    Assessment and Plan: OSA- benefits from CPA auto 4-10 with good compliance and control Dyspnea on Exertion- probably mostly obesity and deconditioning. Since she relates it to time of her surgery, we do want to make sure she has not had recurrent VTE.- Plan CTa to r/o PE Hx lung nodules 2014- resolved, c/w benign inflammatory issue at that time.  Follow Up Instructions: As scheduled    I discussed the assessment and treatment plan with the patient. The patient was provided an opportunity to ask questions and all were answered. The patient agreed with the plan and demonstrated an understanding of the instructions.   The patient was advised to call  back or seek an in-person evaluation if the symptoms worsen or if the condition fails to improve as anticipated.  I provided 21 minutes of non-face-to-face time during this encounter.   Baird Lyons,  MD    ROS-see HPI   + = positive Constitutional:    weight loss, night sweats, fevers, chills,+ fatigue, lassitude. HEENT:   + headaches, +ifficulty swallowing, tooth/dental problems, sore throat,       sneezing, itching, ear ache, +nasal congestion, post nasal drip, snoring CV:    chest pain, orthopnea, PND, + swelling in lower extremities, anasarca,                                                    dizziness, palpitations Resp:  +shortness of breath with exertion or at rest.                productive cough,   non-productive cough, coughing up of blood.              change in color of mucus.  wheezing.   Skin:    rash or lesions. GI:  + IBS GU: dysuria, change in color of urine, no urgency or frequency.  MS:   +joint pain, stiffness, decreased range of motion, back pain. Neuro-     nothing unusual Psych:  change in mood or affect.  depression or anxiety.   memory loss.  OBJ- Physical Exam General- Alert, Oriented, Affect-appropriate, Distress- none acute, + morbidly obese, wheelchair Skin- rash-none, lesions- none, excoriation- none. + Herpetic fever blister lip Lymphadenopathy- none Head- atraumatic            Eyes- Gross vision intact, PERRLA, conjunctivae and secretions clear            Ears- Hearing, canals-normal            Nose- Clear, no-Septal dev, mucus, polyps, erosion, perforation             Throat- Mallampati III , mucosa clear , drainage- none, tonsils- atrophic, + dentures Neck- flexible , trachea midline, no stridor , thyroid nl, carotid no bruit Chest - symmetrical excursion , unlabored           Heart/CV- RRR , no murmur , no gallop  , no rub, nl s1 s2                           - JVD- none , edema , stasis changes- none, varices- none           Lung- clear to P&A, wheeze- none, cough- none , dullness-none, rub- none, rales-none,                                Chest wall-  Abd-  Br/ Gen/ Rectal- Not done, not indicated Extrem- cyanosis- none, clubbing, none,  atrophy- none, strength- nl, + cane Neuro- grossly intact to observation

## 2019-10-30 ENCOUNTER — Other Ambulatory Visit: Payer: Self-pay | Admitting: Internal Medicine

## 2019-10-30 ENCOUNTER — Telehealth: Payer: Self-pay | Admitting: Internal Medicine

## 2019-10-30 DIAGNOSIS — R0600 Dyspnea, unspecified: Secondary | ICD-10-CM

## 2019-10-30 DIAGNOSIS — R0609 Other forms of dyspnea: Secondary | ICD-10-CM

## 2019-10-30 DIAGNOSIS — L299 Pruritus, unspecified: Secondary | ICD-10-CM

## 2019-10-30 NOTE — Telephone Encounter (Signed)
She needs to have labs done on Monday,    cmet and CBC with diff,  I have ordered.

## 2019-10-30 NOTE — Telephone Encounter (Signed)
Spoke with pt and she stated that for the last two weeks she has been itching all over her body, she stated that there is no visible rash of any kind. Pt stated that she has not tried any different products. She has been using Aveeno bath gel and taking benadryl daily. She stated that neither of those are working. I have scheduled pt for the next available appt which is next Wednesday and advised to try a different antihistamine such as Zrytec or Claritin. Pt gave a verbal understanding.

## 2019-10-30 NOTE — Telephone Encounter (Signed)
Spoke with pt and she is scheduled for labs on Monday. Pt is aware of appt date and time.

## 2019-10-30 NOTE — Telephone Encounter (Signed)
Pt called she feels like she is itching all over and thinks it maybe a drug reaction but she is not sure because none of her medication have changed. She said that she stopped all her medication except for her insulin and she was still itching. She has restarted all her medications  This has been going on for 2 weeks. No bumps or redness. Benadryl is not working

## 2019-11-01 ENCOUNTER — Encounter: Payer: Self-pay | Admitting: Internal Medicine

## 2019-11-01 NOTE — Assessment & Plan Note (Addendum)
She describes some cough and wheeze, although PFT in October was WNL. Because she says this began around time of surgery in December, we will check CTa chest to exclude PE. Plan- add rescue inhaler, continue to work on weight and endurance

## 2019-11-01 NOTE — Assessment & Plan Note (Signed)
Benefits from CPAP with good compliance and control Plan- increase CPAP range to auto 4-12 for a little more upside control if needed.

## 2019-11-01 NOTE — Assessment & Plan Note (Signed)
These were no longer noted on CT 2019 and were likely inflammatory/ benign.

## 2019-11-01 NOTE — Assessment & Plan Note (Signed)
She struggles with her weight, but it is the most obvious barrier to better quality of life. Encouragement given.

## 2019-11-02 ENCOUNTER — Ambulatory Visit: Payer: Medicare Other | Admitting: Podiatry

## 2019-11-02 ENCOUNTER — Other Ambulatory Visit: Payer: Medicare Other

## 2019-11-03 ENCOUNTER — Other Ambulatory Visit: Payer: Self-pay

## 2019-11-03 ENCOUNTER — Other Ambulatory Visit (INDEPENDENT_AMBULATORY_CARE_PROVIDER_SITE_OTHER): Payer: Medicare Other

## 2019-11-03 DIAGNOSIS — L299 Pruritus, unspecified: Secondary | ICD-10-CM

## 2019-11-04 ENCOUNTER — Encounter: Payer: Self-pay | Admitting: Internal Medicine

## 2019-11-04 ENCOUNTER — Telehealth (INDEPENDENT_AMBULATORY_CARE_PROVIDER_SITE_OTHER): Payer: Medicare Other | Admitting: Internal Medicine

## 2019-11-04 DIAGNOSIS — R635 Abnormal weight gain: Secondary | ICD-10-CM

## 2019-11-04 DIAGNOSIS — R0600 Dyspnea, unspecified: Secondary | ICD-10-CM

## 2019-11-04 DIAGNOSIS — L299 Pruritus, unspecified: Secondary | ICD-10-CM

## 2019-11-04 DIAGNOSIS — R0609 Other forms of dyspnea: Secondary | ICD-10-CM

## 2019-11-04 DIAGNOSIS — R1010 Upper abdominal pain, unspecified: Secondary | ICD-10-CM

## 2019-11-04 DIAGNOSIS — Z9889 Other specified postprocedural states: Secondary | ICD-10-CM

## 2019-11-04 DIAGNOSIS — Z9884 Bariatric surgery status: Secondary | ICD-10-CM | POA: Diagnosis not present

## 2019-11-04 LAB — COMPREHENSIVE METABOLIC PANEL
ALT: 10 U/L (ref 0–35)
AST: 14 U/L (ref 0–37)
Albumin: 3.6 g/dL (ref 3.5–5.2)
Alkaline Phosphatase: 133 U/L — ABNORMAL HIGH (ref 39–117)
BUN: 15 mg/dL (ref 6–23)
CO2: 23 mEq/L (ref 19–32)
Calcium: 8.7 mg/dL (ref 8.4–10.5)
Chloride: 105 mEq/L (ref 96–112)
Creatinine, Ser: 1 mg/dL (ref 0.40–1.20)
GFR: 54.18 mL/min — ABNORMAL LOW (ref >60.00)
Glucose, Bld: 161 mg/dL — ABNORMAL HIGH (ref 70–99)
Potassium: 3.9 mEq/L (ref 3.5–5.1)
Sodium: 139 mEq/L (ref 135–145)
Total Bilirubin: 0.3 mg/dL (ref 0.2–1.2)
Total Protein: 7.1 g/dL (ref 6.0–8.3)

## 2019-11-04 NOTE — Progress Notes (Signed)
Telephone  Note  This visit type was conducted due to national recommendations for restrictions regarding the COVID-19 pandemic (e.g. social distancing).  This format is felt to be most appropriate for this patient at this time.  All issues noted in this document were discussed and addressed.  No physical exam was performed (except for noted visual exam findings with Video Visits).   I connected with@ on 11/04/19 at  4:30 PM EDT by  telephone and verified that I am speaking with the correct person using two identifiers. Location patient: home Location provider: work or home office Persons participating in the virtual visit: patient, provider  I discussed the limitations, risks, security and privacy concerns of performing an evaluation and management service by telephone and the availability of in person appointments. I also discussed with the patient that there may be a patient responsible charge related to this service. The patient expressed understanding and agreed to proceed.  Reason for visit: itching , weight gain,  Dyspnea with exertion   HPI:  74 yr old female with T2DM , morbid obesity s/p recent pannectomy  Presents with generalized pruritus .  The symptoms started 3 weeks ago and are unaccompanied by rash, nausea, fevers.  Includes . Nostrils  Ears, trunk, webs of fingers,  And extremities.   No rash except in scalp she has felt some small papules above her ears. She notes a granula texture to the skin of her forehead after she rubs it She has not changed detergents or soaps  .  Has not slept in a hotel or had any overnight guests.  Surgery was over 6 weeks ago.  Husband sleeps in same king sized bed and has not had any of same symptoms . She has tried Using 50 mg benadryl but it makes her too sleepy  Has not tried new antihistamines.  Using aveeno bath   Weight gain:  She continues to gain weight despite reporting a limited appetite . She has been on activity restrictions following her  panniculectomy on Dec 1,  But has healed completely and all restrictions lifted.  She is now limited by severe deconditioning and is undergoing pulmonary workup to determine if pulmonary hypertension and/or restrictive lung disease are playing a role.  S/p panniculectomy Jun 16 2019.  No hernia noted per plastic surgery OV note  ROS: See pertinent positives and negatives per HPI.  Past Medical History:  Diagnosis Date  . Abnormality of gait 03/25/2013  . Adrenal mass, left ([place])   . Anemia    iron deficient post  2 unit txfsn 2009, normal endo/colonoscopy by [facility]  . Arthritis   . Atypical chest pain    a. 04/1986 Cath ([facility]): nl cors, EF 65%; b. 07/2012 Cath [facility]): LM nl, LAD/LCX/RCA w/ min irregs; c. 07/2016 MV [facility]): "Equivocal" w/ small inf wall rev defect and hyperdynamic LV contraction, EF 87%; d. 08/2016 Cardiac CT Ca2+ score [facility]): Ca2+ score 1548.  [name] Cervical spinal stenosis 1994   due to trauma to back ([employer] accident), has intermittent paralysis and parasthesias  . Cervicogenic headache 03/23/2014  . Depression   . Dizziness    chronic dizziness  . DJD (degenerative joint disease)    a. Chronic R shoulder pain pending R shoulder replacement 07/2017.  [name] Esophageal stenosis March 2011   with transietn outlet obstruction by food, cleared by EGD   . Family history of adverse reaction to anesthesia    daughter PONV  . Gastric bypass status for obesity   .  Headache(784.0)   . Hypertension   . IBS (irritable bowel syndrome)   . Left bundle branch block (LBBB)    a. Intermittently present - likely rate related. - patient denies  . Obesity   . Obstructive sleep apnea    using CPAP  . 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 Restless leg syndrome   . Rotator cuff arthropathy, right 08/13/2017  . Syncope and collapse 03/12/2014  . Type II diabetes mellitus ([place])     Past Surgical History:  Procedure Laterality Date  . ABDOMINAL HYSTERECTOMY    . APPENDECTOMY    .  COLONOSCOPY    . DIAPHRAGMATIC HERNIA REPAIR  2015  . ESOPHAGEAL DILATION     multiple  . ESOPHAGOGASTRODUODENOSCOPY    . [place]  . GALLBLADDER SURGERY  resection  . GASTRIC BYPASS    . GASTRIC BYPASS  2000, 2005   Dr. Debroah Loop  . [facility] IMPLANT PLACEMENT  April 2013   [place]  . JOINT REPLACEMENT  2007   bilateral knee. [name],  [name]  . PANNICULECTOMY  06/16/2019  . PANNICULECTOMY N/A 06/16/2019   Procedure: PANNICULECTOMY;  Surgeon: [name], MD;  Location: [place];  Service: Plastics;  Laterality: N/A;  3 hours, please  . REVERSE SHOULDER ARTHROPLASTY Right 08/13/2017   Procedure: REVERSE RIGHT SHOULDER ARTHROPLASTY;  Surgeon: [name], MD;  Location: [place];  Service: Orthopedics;  Laterality: Right;  . ROTATOR CUFF REPAIR     right  . [place]  . TOTAL ABDOMINAL HYSTERECTOMY W/ BILATERAL SALPINGOOPHORECTOMY  1974  . UMBILICAL HERNIA REPAIR  Aug 11, 2015    Family History  Problem Relation Age of Onset  . Heart disease Father   . Hypertension Father   . Prostate cancer Father   . Stroke Father   . Osteoporosis Father   . Stroke Mother   . Depression Mother   . Headache Mother   . Heart disease Mother   . Thyroid disease Mother   . Hypertension Mother   . Diabetes Daughter   . Heart disease Daughter   . Hypertension Daughter   . Hypertension Son     SOCIAL HX:  reports that she has never smoked. She has never used smokeless tobacco. She reports that she does not drink alcohol or use drugs.   Current Outpatient Medications:  .  acetaminophen (TYLENOL) 650 MG CR tablet, Take 650 mg by mouth every 8 (eight) hours as needed for pain., Disp: , Rfl:  .  albuterol (VENTOLIN HFA) 108 (90 Base) MCG/ACT inhaler, Inhale 2 puffs into the lungs every 8 (eight) hours as needed for wheezing or shortness of breath., Disp: 8 g, Rfl: 2 .  aspirin EC 81 MG tablet, Take 1 tablet (81 mg total) by mouth daily., Disp: 90 tablet, Rfl:  3 .  benzonatate (TESSALON) 200 MG capsule, Take 1 capsule (200 mg total) by mouth 2 (two) times daily as needed for cough., Disp: 30 capsule, Rfl: 0 .  blood glucose meter kit and supplies, Use to check sugars up to three times daily. Dx code: E11.9, Disp: 1 each, Rfl: 0 .  Blood Glucose Monitoring Suppl (ONETOUCH VERIO) w/Device KIT, 1 Device by Does not apply route daily. Use to test blood glucose once daily. Dx code = E11.9, Disp: 1 kit, Rfl: 0 .  buPROPion (WELLBUTRIN XL) 150 MG 24 hr tablet, Take 1 tablet (150 mg total) by mouth daily., Disp: 90 tablet, Rfl: 1 .  buPROPion (WELLBUTRIN) 75 MG tablet, TAKE 1-2 TABLETS (75-150 MG TOTAL) BY MOUTH SEE ADMIN INSTRUCTIONS. TAKE 2 TABLETS IN THE MORNING AND 1 TABLET IN THE AFTERNOON, Disp: 270 tablet, Rfl: 1 .  butalbital-acetaminophen-caffeine (FIORICET) 50-325-40 MG tablet, Take 1 tablet by mouth every 6 (six) hours as needed for headache., Disp: 90 tablet, Rfl: 5 .  cholestyramine light (PREVALITE) 4 g packet, Take 4 g by mouth 3 (three) times daily as needed (diarrhea). , Disp: , Rfl:  .  citalopram (CELEXA) 10 MG tablet, Take 1 tablet (10 mg total) by mouth daily. (Patient taking differently: Take 10 mg by mouth every evening. ), Disp: 90 tablet, Rfl: 3 .  clotrimazole-betamethasone (LOTRISONE) cream, Apply 1 application topically 2 (two) times daily. (Patient taking differently: Apply 1 application topically 2 (two) times daily as needed (rash). ), Disp: 45 g, Rfl: 3 .  Continuous Blood Gluc Receiver (FREESTYLE LIBRE 14 DAY READER) DEVI, 1 applicator by Does not apply route 4 (four) times daily., Disp: 1 each, Rfl: 6 .  Continuous Blood Gluc Sensor (FREESTYLE LIBRE 14 DAY SENSOR) MISC, Use to test blood sugar 4 times daily, Disp: 1 each, Rfl: 6 .  cyanocobalamin (,VITAMIN B-12,) 1000 MCG/ML injection, Inject 1 ml (1000 mcg ) IM weekly x 4,  Then monthly thereafter (Patient taking differently: Inject 1,000 mcg into the muscle every 30 (thirty) days.  On the 12th day), Disp: 110 mL, Rfl: 0 .  diazepam (VALIUM) 5 MG tablet, Take 1 tablet (5 mg total) by mouth at bedtime as needed for anxiety. (Patient taking differently: Take 5 mg by mouth at bedtime. ), Disp: 30 tablet, Rfl: 5 .  dicyclomine (BENTYL) 10 MG capsule, Take 1 capsule (10 mg total) by mouth 4 (four) times daily -  before meals and at bedtime., Disp: 360 capsule, Rfl: 3 .  diphenhydrAMINE (BENADRYL) 25 MG tablet, Take 50 mg by mouth daily as needed for allergies., Disp: , Rfl:  .  diphenoxylate-atropine (LOMOTIL) 2.5-0.025 MG tablet, Take 1 tablet by mouth 4 (four) times daily as needed for diarrhea or loose stools., Disp: 60 tablet, Rfl: 0 .  Eszopiclone 3 MG TABS, Take 1 tablet (3 mg total) by mouth at bedtime., Disp: 90 tablet, Rfl: 1 .  Insulin Glargine (LANTUS SOLOSTAR) 100 UNIT/ML Solostar Pen, Inject 11 Units into the skin daily. (Patient taking differently: Inject 10-13 Units into the skin daily. ), Disp: 5 pen, Rfl: 3 .  Insulin Pen Needle 31G X 5 MM MISC, Use to inject insulin daily, Disp: 30 each, Rfl: 1 .  ipratropium (ATROVENT) 0.06 % nasal spray, Place 2 sprays into both nostrils 4 (four) times daily., Disp: 15 mL, Rfl: 12 .  Lancet Devices (ONE TOUCH DELICA LANCING DEV) MISC, Use to test blood glucose once daily. Dx code = E11.9, Disp: 1 each, Rfl: 0 .  levETIRAcetam (KEPPRA) 750 MG tablet, Take 1 tablet (750 mg total) by mouth at bedtime. (Patient taking differently: Take 750 mg by mouth at bedtime as needed (restless legs). ), Disp: 90 tablet, Rfl: 3 .  NONFORMULARY OR COMPOUNDED ITEM, [facility]  Combination Pain Cream -  Baclofen 2%, Doxepin 5%, Gabapentin 6%, Topiramate 2%, Pentoxifylline 3% Apply 1-2 grams to affected area 3-4 times daily Qty. 120 gm 3 refills, Disp: 1 each, Rfl: 3 .  nystatin (NYAMYC) powder, Apply 1 g topically 4 (four) times daily as needed (rash). , Disp: , Rfl:  .  ondansetron (ZOFRAN ODT) 4 MG disintegrating tablet, Take 1 tablet (4  mg  total) by mouth every 8 (eight) hours as needed for nausea or vomiting., Disp: 20 tablet, Rfl: 0 .  ONETOUCH DELICA LANCETS FINE MISC, Use to test blood sguar once daily. Dx code = E11.9, Disp: 100 each, Rfl: 3 .  ONETOUCH VERIO test strip, USE TO TEST BLOOD SUGAR ONCE DAILY. DX CODE = E11.9, Disp: 100 strip, Rfl: 12 .  OVER THE COUNTER MEDICATION, Apply 1 application topically daily as needed (pain). [place], Disp: , Rfl:  .  pregabalin (LYRICA) 100 MG capsule, Take 100 mg by mouth at bedtime as needed (leg cramps). , Disp: , Rfl:  .  Syringe/Needle, Disp, (SYRINGE 3CC/25GX1") 25G X 1" 3 ML MISC, Use for b12 injections, Disp: 50 each, Rfl: 0 .  traMADol (ULTRAM) 50 MG tablet, Take 0.5 tablets (25 mg total) by mouth every 12 (twelve) hours as needed for severe pain., Disp: 30 tablet, Rfl: 0 .  umeclidinium-vilanterol (ANORO ELLIPTA) 62.5-25 MCG/INH AEPB, Inhale 1 puff into the lungs daily., Disp: 1 each, Rfl: 12  EXAM:  General impression: alert, cooperative and articulate.  No signs of being in distress  Lungs: speech is fluent sentence length suggests that patient is not short of breath and not punctuated by cough, sneezing or sniffing. [name]   Psych: affect normal.  speech is articulate and non pressured .  pleasant and cooperative, no obvious depression or anxiety, speech and thought processing grossly intact  ASSESSMENT AND PLAN:  Discussed the following assessment and plan:  Pruritus  S/P panniculectomy  Weight gain following gastric bypass surgery  Pain of upper abdomen  DOE (dyspnea on exertion)  Pruritus Etiology unclear.  Liver enzymes are normal/stable alk phos,  And no eosinophilia on CBC.  Recommend trial of fexofenadine q 6 hrs for chronic urticaria and see dermatology   Lab Results  Component Value Date   ALT 10 11/03/2019   AST 14 11/03/2019   ALKPHOS 133 (H) 11/03/2019   BILITOT 0.3 11/03/2019   Lab Results  Component Value Date   WBC 10.8 (H) 11/03/2019    HGB 11.0 (L) 11/03/2019   HCT 35.5 (L) 11/03/2019   MCV 83.1 11/03/2019   PLT 314.0 11/03/2019     S/P panniculectomy She has completely healed from her surgery done Jun 16 2019.  All restrictions have been lifted by plastic surgery   Weight gain following gastric bypass surgery Aggravated by lack of physical exercise resulting in positive caloric balance .  No evidence of ascites per plastic surgery post op evaluation.  She has no known medical  conditions that would result in ascites.   Will offer referral to bariatric vs nutritional weight management   Abdominal pain Etiology unclear.  Gastric emptying study was normal March 2021.   DOE (dyspnea on exertion) Etiology remains unclear.  PFTS were negative for restriction/obstruction.  She has OSA/OHS so pulmonary hypertension and deconditioning are both likely  contributors. Last ECHO in 2019 noted a PA peak pressure of 33 and Grade 2 diastolic dysfunction.  Will recommend repeat ECHO if CT chest is noncontributory     I discussed the assessment and treatment plan with the patient. The patient was provided an opportunity to ask questions and all were answered. The patient agreed with the plan and demonstrated an understanding of the instructions.   The patient was advised to call back or seek an in-person evaluation if the symptoms worsen or if the condition fails to improve as anticipated.    I provided  30  minutes of non-face-to-face time during this encounter reviewing patient's current problems and past surgeries, labs and imaging studies, providing counseling on the above mentioned problems , and coordination  of care .  [name], MD

## 2019-11-05 ENCOUNTER — Other Ambulatory Visit: Payer: Self-pay

## 2019-11-05 ENCOUNTER — Encounter: Payer: Self-pay | Admitting: Podiatry

## 2019-11-05 ENCOUNTER — Ambulatory Visit (INDEPENDENT_AMBULATORY_CARE_PROVIDER_SITE_OTHER): Payer: Medicare Other | Admitting: Podiatry

## 2019-11-05 VITALS — Temp 97.3°F

## 2019-11-05 DIAGNOSIS — E11610 Type 2 diabetes mellitus with diabetic neuropathic arthropathy: Secondary | ICD-10-CM

## 2019-11-05 DIAGNOSIS — E1149 Type 2 diabetes mellitus with other diabetic neurological complication: Secondary | ICD-10-CM

## 2019-11-05 DIAGNOSIS — M79676 Pain in unspecified toe(s): Secondary | ICD-10-CM

## 2019-11-05 DIAGNOSIS — B351 Tinea unguium: Secondary | ICD-10-CM

## 2019-11-05 LAB — CBC WITH DIFFERENTIAL/PLATELET
Basophils Absolute: 0.1 10*3/uL (ref 0.0–0.1)
Basophils Relative: 0.6 % (ref 0.0–3.0)
Eosinophils Absolute: 0.1 10*3/uL (ref 0.0–0.7)
Eosinophils Relative: 1.4 % (ref 0.0–5.0)
HCT: 35.5 % — ABNORMAL LOW (ref 36.0–46.0)
Hemoglobin: 11 g/dL — ABNORMAL LOW (ref 12.0–15.0)
Lymphocytes Relative: 18.7 % (ref 12.0–46.0)
Lymphs Abs: 2 10*3/uL (ref 0.7–4.0)
MCHC: 31 g/dL (ref 30.0–36.0)
MCV: 83.1 fl (ref 78.0–100.0)
Monocytes Absolute: 0.4 10*3/uL (ref 0.1–1.0)
Monocytes Relative: 4.1 % (ref 3.0–12.0)
Neutro Abs: 8.2 10*3/uL — ABNORMAL HIGH (ref 1.4–7.7)
Neutrophils Relative %: 75.2 % (ref 43.0–77.0)
Platelets: 314 10*3/uL (ref 150.0–400.0)
RBC: 4.27 Mil/uL (ref 3.87–5.11)
RDW: 17.5 % — ABNORMAL HIGH (ref 11.5–15.5)
WBC: 10.8 10*3/uL — ABNORMAL HIGH (ref 4.0–10.5)

## 2019-11-05 NOTE — Progress Notes (Signed)
This patient returns to my office for at risk foot care.  This patient requires this care by a professional since this patient will be at risk due to having diabetes and charcot feet.  This patient is unable to cut nails herself since the patient cannot reach her nails.These nails are painful walking and wearing shoes.  This patient presents for at risk foot care today.  General Appearance  Alert, conversant and in no acute stress.  Vascular  Dorsalis pedis and posterior tibial  pulses are palpable  bilaterally.  Capillary return is within normal limits  bilaterally. Temperature is within normal limits  bilaterally.  Neurologic  Senn-Weinstein monofilament wire test absent   bilaterally. Muscle power within normal limits bilaterally.  Nails Thick disfigured discolored nails with subungual debris  from hallux to fifth toes bilaterally. No evidence of bacterial infection or drainage bilaterally.  Orthopedic  No limitations of motion  feet .  No crepitus or effusions noted.  No bony pathology or digital deformities noted. Charcot foot.  Severe rearfoot arthritis.  Skin  normotropic skin with no porokeratosis noted bilaterally.  No signs of infections or ulcers noted.     Onychomycosis  Pain in right toes  Pain in left toes  Consent was obtained for treatment procedures.   Mechanical debridement of nails 1-5  bilaterally performed with a nail nipper.  Filed with dremel without incident.    Return office visit    3 months                  Told patient to return for periodic foot care and evaluation due to potential at risk complications.   Gardiner Barefoot DPM

## 2019-11-06 DIAGNOSIS — L299 Pruritus, unspecified: Secondary | ICD-10-CM | POA: Insufficient documentation

## 2019-11-06 NOTE — Assessment & Plan Note (Addendum)
Etiology remains unclear.  PFTS were negative for restriction/obstruction.  She has OSA/OHS so pulmonary hypertension and deconditioning are both likely  contributors. Last ECHO in 2019 noted a PA peak pressure of 33 and Grade 2 diastolic dysfunction.  Will recommend repeat ECHO if CT chest is noncontributory

## 2019-11-06 NOTE — Assessment & Plan Note (Addendum)
Etiology unclear.  Liver enzymes are normal/stable alk phos,  And no eosinophilia on CBC.  Recommend trial of fexofenadine q 6 hrs for chronic urticaria and see dermatology   Lab Results  Component Value Date   ALT 10 11/03/2019   AST 14 11/03/2019   ALKPHOS 133 (H) 11/03/2019   BILITOT 0.3 11/03/2019   Lab Results  Component Value Date   WBC 10.8 (H) 11/03/2019   HGB 11.0 (L) 11/03/2019   HCT 35.5 (L) 11/03/2019   MCV 83.1 11/03/2019   PLT 314.0 11/03/2019

## 2019-11-06 NOTE — Assessment & Plan Note (Signed)
She has completely healed from her surgery done Jun 16 2019.  All restrictions have been lifted by plastic surgery

## 2019-11-06 NOTE — Assessment & Plan Note (Signed)
Aggravated by lack of physical exercise resulting in positive caloric balance .  No evidence of ascites per plastic surgery post op evaluation.  She has no known medical  conditions that would result in ascites.   Will offer referral to bariatric vs nutritional weight management

## 2019-11-06 NOTE — Assessment & Plan Note (Signed)
Etiology unclear.  Gastric emptying study was normal March 2021.

## 2019-11-09 ENCOUNTER — Other Ambulatory Visit
Admission: RE | Admit: 2019-11-09 | Discharge: 2019-11-09 | Disposition: A | Discharge status: Home / Self Care | Payer: Medicare Other | Source: Ambulatory Visit | Attending: Internal Medicine | Admitting: Internal Medicine

## 2019-11-09 DIAGNOSIS — R0600 Dyspnea, unspecified: Secondary | ICD-10-CM | POA: Diagnosis not present

## 2019-11-09 DIAGNOSIS — R0609 Other forms of dyspnea: Secondary | ICD-10-CM

## 2019-11-09 LAB — BASIC METABOLIC PANEL
Anion gap: 9 (ref 5–15)
BUN: 12 mg/dL (ref 8–23)
CO2: 25 mmol/L (ref 22–32)
Calcium: 8.4 mg/dL — ABNORMAL LOW (ref 8.9–10.3)
Chloride: 105 mmol/L (ref 98–111)
Creatinine, Ser: 1.11 mg/dL — ABNORMAL HIGH (ref 0.44–1.00)
GFR calc Af Amer: 57 mL/min — ABNORMAL LOW (ref >60)
GFR calc non Af Amer: 49 mL/min — ABNORMAL LOW (ref >60)
Glucose, Bld: 166 mg/dL — ABNORMAL HIGH (ref 70–99)
Potassium: 3.8 mmol/L (ref 3.5–5.1)
Sodium: 139 mmol/L (ref 135–145)

## 2019-11-11 ENCOUNTER — Other Ambulatory Visit: Payer: Self-pay

## 2019-11-11 ENCOUNTER — Ambulatory Visit
Admission: RE | Admit: 2019-11-11 | Discharge: 2019-11-11 | Disposition: A | Discharge status: Home / Self Care | Payer: Medicare Other | Source: Ambulatory Visit | Attending: Internal Medicine | Admitting: Internal Medicine

## 2019-11-11 DIAGNOSIS — R0989 Other specified symptoms and signs involving the circulatory and respiratory systems: Secondary | ICD-10-CM | POA: Insufficient documentation

## 2019-11-11 DIAGNOSIS — R0609 Other forms of dyspnea: Secondary | ICD-10-CM

## 2019-11-11 DIAGNOSIS — R918 Other nonspecific abnormal finding of lung field: Secondary | ICD-10-CM | POA: Diagnosis not present

## 2019-11-11 DIAGNOSIS — R0600 Dyspnea, unspecified: Secondary | ICD-10-CM | POA: Insufficient documentation

## 2019-11-11 MED ORDER — IOHEXOL 300 MG/ML  SOLN
75.0000 mL | Freq: Once | INTRAMUSCULAR | Status: AC | PRN
  Administered 2019-11-11: 75 mL via INTRAVENOUS

## 2019-11-19 NOTE — Progress Notes (Signed)
Pt wants to know if her results are normal, why is she still short of breath?  She wants to know what the next step is to get her more mobile and less sob.  Pt states she has gained 30 lbs since December.  All she can do is get up and get dressed.  She went to [employer] today and was so short of breath she had to stop to catch her breath walking to the front of the restaurant.  She had to stop again when she got to her car because she was so short of breath she became dizzy.  She could not eat lunch because she was so sob.  She gets sob and coughs until she gets dizzy and almost passes out.

## 2019-11-20 NOTE — Progress Notes (Signed)
I attempted to call patient and relay information provided by Dr. Annamaria Boots regarding her concerns voiced on 11/19/19.  She hung up on me twice.  Before she hung up the 2nd time she stated, "I am not going to talk to you".

## 2019-11-24 NOTE — Progress Notes (Signed)
Follow-up Outpatient Visit Date: 11/25/2019  Primary Care Provider: [name], MD [address] [place] [address]  Chief Complaint: Chest pain and shortness of breath  HPI:  Denise Sharp is a 74 y.o. 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  She was last seen in 05/2019 for preop assessment in anticipation of panniculectomy.  She reported stable chronic exertional dyspnea.  We agreed to perform a pharmacologic myocardial perfusion stress test, which was low risk without ischemia.  Today, Denise Sharp reports that she has noticed increasing exertional dyspnea that is now present with minimal activity such as walking from her bedroom to the kitchen.  She states that her pulmonologist has not been able to identify a cause for her symptoms.  She also has chronic cough as well as dizzy spells and frequent headaches.  She also notes intermittent chest pain that she describes as heaviness.  She feels like she cannot take a deep breath when she is having the pain.  It seems to be associated with low blood pressures (down to 80/30) as well as lower abdominal pain.  Denise Sharp had a slow recovery from her panniculectomy in December.  She continues to feel weak.  She has not had any significant edema or palpitations.  She feels lightheaded when her blood pressures are soft.  Despite 2 prior weight loss surgeries, Denise Sharp is discouraged that she has continued to put on weight and inquires about taking a keto diet supplement that has reportedly allowed some people to lose up to 80 pounds in 3  months.  --------------------------------------------------------------------------------------------------  Cardiovascular History & Procedures: Cardiovascular Problems:  Intermittent left bundle branch block  Chronic atypical chest pain and shortness of breath  Risk Factors:  Hypertension, hyperlipidemia, diabetes mellitus, obesity, sedentary lifestyle, and age greater than 75  Cath/PCI:  LHC (07/24/12, [facility]): LMCA normal. LAD, LCx, and RCA with minor luminal irregularities.  LHC (04/15/86, [facility]): Normal LMCA, LAD, LCx, and RCA. LVEF 65%.  CV Surgery:  None  EP Procedures and Devices:  None  Non-Invasive Evaluation(s):  Pharmacologic MPI (06/03/19): Low risk study without evidence of ischemia.  Small fixed defect at the apex was felt to be most consistent with artifact and less likely scar.  Cardiac CT calcium score (09/03/16, Dr. Humphrey Rolls): Coronary calcium score 1548.  Pharmacologic myocardial perfusion stress test (08/08/16, Dr. Humphrey Rolls): "Equivocal stress test" with small inferior wall reversible defect and hyperdynamic LV contraction (EF 87%).  Transthoracic echocardiogram (08/06/16, Dr. Humphrey Rolls): Technically difficult study with mildly dilated left ventricle. LVEF was normal with normal wall motion. Grade 1 diastolic dysfunction. Mildly dilated RV with normal contraction. Mitral annular calcification present with mild MR. Mild PR and TR also present. Normal pulmonary artery pressure. Severe left and mild right atrial enlargement.  Pharmacologic myocardial perfusion stress test (08/11/15, [facility]): Normal perfusion without ischemia or scar. LVEF 81%.  Recent CV Pertinent Labs: Lab Results  Component Value Date   CHOL 193 02/24/2018   CHOL 155 02/27/2014   HDL 51.40 02/24/2018   HDL 43 02/27/2014   LDLCALC 102 (H) 02/24/2018   LDLCALC 88 02/27/2014   LDLDIRECT 111.0 06/16/2015   TRIG 197.0 (H) 02/24/2018   TRIG 121 02/27/2014   CHOLHDL 4 02/24/2018  K 3.8 11/09/2019    K 4.0 06/21/2014   MG 1.5 (L) 10/22/2018   MG 1.5 (L) 08/05/2012   BUN 12 11/09/2019   BUN 11 10/22/2018   BUN 12 06/21/2014   CREATININE 1.11 (H) 11/09/2019   CREATININE 0.88 03/06/2019    Past medical and surgical history were reviewed and updated in EPIC.  Current Meds  Medication Sig  . acetaminophen (TYLENOL) 650 MG CR tablet Take 650 mg by mouth every 8 (eight) hours as needed for pain.  [name] albuterol (VENTOLIN HFA) 108 (90 Base) MCG/ACT inhaler Inhale 2 puffs into the lungs every 8 (eight) hours as needed for wheezing or shortness of breath.  [name] aspirin EC 81 MG tablet Take 1 tablet (81 mg total) by mouth daily.  . benzonatate (TESSALON) 200 MG capsule Take 1 capsule (200 mg total) by mouth 2 (two) times daily as needed for cough.  . blood glucose meter kit and supplies Use to check sugars up to three times daily. Dx code: E11.9  . Blood Glucose Monitoring Suppl (ONETOUCH VERIO) w/Device KIT 1 Device by Does not apply route daily. Use to test blood glucose once daily. Dx code = E11.9  . buPROPion (WELLBUTRIN XL) 150 MG 24 hr tablet Take 1 tablet (150 mg total) by mouth daily.  [name] buPROPion (WELLBUTRIN) 75 MG tablet TAKE 1-2 TABLETS (75-150 MG TOTAL) BY MOUTH SEE ADMIN INSTRUCTIONS. TAKE 2 TABLETS IN THE MORNING AND 1 TABLET IN THE AFTERNOON  . butalbital-acetaminophen-caffeine (FIORICET) 50-325-40 MG tablet Take 1 tablet by mouth every 6 (six) hours as needed for headache.  . cholestyramine light (PREVALITE) 4 g packet Take 4 g by mouth 3 (three) times daily as needed (diarrhea).   . citalopram (CELEXA) 10 MG tablet Take 1 tablet (10 mg total) by mouth daily. (Patient taking differently: Take 10 mg by mouth every evening. )  . clotrimazole-betamethasone (LOTRISONE) cream Apply 1 application topically 2 (two) times daily. (Patient taking differently: Apply 1 application topically 2 (two) times daily as needed (rash). )  . Continuous Blood Gluc Receiver (FREESTYLE LIBRE 14 DAY READER)  DEVI 1 applicator by Does not apply route 4 (four) times daily.  . Continuous Blood Gluc Sensor (FREESTYLE LIBRE 14 DAY SENSOR) MISC Use to test blood sugar 4 times daily  . cyanocobalamin (,VITAMIN B-12,) 1000 MCG/ML injection Inject 1 ml (1000 mcg ) IM weekly x 4,  Then monthly thereafter (Patient taking differently: Inject 1,000 mcg into the muscle every 30 (thirty) days. On the 12th day)  . diazepam (VALIUM) 5 MG tablet Take 1 tablet (5 mg total) by mouth at bedtime as needed for anxiety. (Patient taking differently: Take 5 mg by mouth at bedtime. )  . dicyclomine (BENTYL) 10 MG capsule Take 1 capsule (10 mg total) by mouth 4 (four) times daily -  before meals and at bedtime.  . diphenhydrAMINE (BENADRYL) 25 MG tablet Take 50 mg by mouth daily as needed for allergies.  . diphenoxylate-atropine (LOMOTIL) 2.5-0.025 MG tablet Take 1 tablet by mouth 4 (four) times daily as needed for diarrhea or loose stools.  . Eszopiclone 3 MG TABS Take 1 tablet (3 mg total) by mouth at bedtime.  . Insulin Glargine (LANTUS SOLOSTAR) 100 UNIT/ML Solostar Pen Inject 11 Units into the skin daily. (Patient taking differently: Inject 10-13 Units into the skin daily. )  . Insulin Pen Needle 31G X 5 MM MISC Use to inject insulin daily  . ipratropium (ATROVENT) 0.06 % nasal spray Place 2 sprays  into both nostrils 4 (four) times daily.  Elmore Guise Devices (ONE TOUCH DELICA LANCING DEV) MISC Use to test blood glucose once daily. Dx code = E11.9  . levETIRAcetam (KEPPRA) 750 MG tablet Take 1 tablet (750 mg total) by mouth at bedtime. (Patient taking differently: Take 750 mg by mouth at bedtime as needed (restless legs). )  . NONFORMULARY OR COMPOUNDED Hackberry  Combination Pain Cream -  Baclofen 2%, Doxepin 5%, Gabapentin 6%, Topiramate 2%, Pentoxifylline 3% Apply 1-2 grams to affected area 3-4 times daily Qty. 120 gm 3 refills  . nystatin [facility]) powder Apply 1 g topically 4 (four) times daily as needed  (rash).   . ondansetron (ZOFRAN ODT) 4 MG disintegrating tablet Take 1 tablet (4 mg total) by mouth every 8 (eight) hours as needed for nausea or vomiting.  [name] OVER THE COUNTER MEDICATION Apply 1 application topically daily as needed (pain). [place] Gell  . pregabalin (LYRICA) 100 MG capsule Take 100 mg by mouth at bedtime as needed (leg cramps).   . Syringe/Needle, Disp, (SYRINGE 3CC/25GX1") 25G X 1" 3 ML MISC Use for b12 injections  . traMADol (ULTRAM) 50 MG tablet Take 0.5 tablets (25 mg total) by mouth every 12 (twelve) hours as needed for severe pain.  [name] umeclidinium-vilanterol (ANORO ELLIPTA) 62.5-25 MCG/INH AEPB Inhale 1 puff into the lungs daily.    Allergies: Biaxin [clarithromycin], Demeclocycline, Erythromycin, Flagyl [metronidazole], Glucophage [metformin hcl], Tetracyclines & related, Diovan [valsartan], Sulfa antibiotics, and Xanax [alprazolam]  Social History   Tobacco Use  . Smoking status: Never Smoker  . Smokeless tobacco: Never Used  Substance Use Topics  . Alcohol use: No  . Drug use: No    Family History  Problem Relation Age of Onset  . Heart disease Father   . Hypertension Father   . Prostate cancer Father   . Stroke Father   . Osteoporosis Father   . Stroke Mother   . Depression Mother   . Headache Mother   . Heart disease Mother   . Thyroid disease Mother   . Hypertension Mother   . Diabetes Daughter   . Heart disease Daughter   . Hypertension Daughter   . Hypertension Son     Review of Systems: A 12-system review of systems was performed and was negative except as noted in the HPI.  --------------------------------------------------------------------------------------------------  Physical Exam: BP 120/68 (BP Location: Right Arm, Patient Position: Sitting, Cuff Size: Normal)   Pulse 77   Ht 5' 4.5" (1.638 m)   Wt 253 lb (114.8 kg)   SpO2 95%   BMI 42.76 kg/m   General: NAD.  Seated in a wheelchair. Neck: No gross JVD or HJR, though body  habitus limits evaluation. Lungs: Mildly diminished breath sounds throughout without wheezes or crackles. Heart: Distant heart sounds.  Regular rate and rhythm without murmurs. Abdomen: Obese with mild lower abdominal tenderness.  Soft. Extremities: No edema.  EKG: Normal sinus rhythm with frequent PVCs.  Otherwise, no significant abnormality.  PVCs are new since 05/27/2019.  Otherwise, no significant change.  Lab Results  Component Value Date   WBC 10.8 (H) 11/03/2019   HGB 11.0 (L) 11/03/2019   HCT 35.5 (L) 11/03/2019   MCV 83.1 11/03/2019   PLT 314.0 11/03/2019    Lab Results  Component Value Date   NA 139 11/09/2019   K 3.8 11/09/2019   CL 105 11/09/2019   CO2 25 11/09/2019   BUN 12 11/09/2019   CREATININE 1.11 (H) 11/09/2019  GLUCOSE 166 (H) 11/09/2019   ALT 10 11/03/2019    Lab Results  Component Value Date   CHOL 193 02/24/2018   HDL 51.40 02/24/2018   LDLCALC 102 (H) 02/24/2018   LDLDIRECT 111.0 06/16/2015   TRIG 197.0 (H) 02/24/2018   CHOLHDL 4 02/24/2018    --------------------------------------------------------------------------------------------------  ASSESSMENT AND PLAN: Shortness of breath and chest pain: Remains unclear why the patient continues to have the symptoms despite extensive work-up by myself and prior cardiologist as well as her pulmonologist.  Myocardial perfusion stress test in November was low risk without ischemia or scar.  She has had prior catheterizations as recently as 2014 demonstrating no significant CAD.  I have recommended a trial of metoprolol succinate 25 mg daily.  We will also repeat an echocardiogram.  If her symptoms do not improve, we may ultimately need to consider right and left heart catheterization again.  Hypertension: Blood pressure reasonably well controlled.  We will add low-dose metoprolol, as above.  Morbid obesity: Patient's BMI remains greater than 40 with multiple comorbidities.  I have advised her that  weight loss supplements are not FDA; as such I cannot recommend them and question the claim of 80 pound weight loss in 3 months.  I encouraged Denise Sharp to work on [employer] loss through diet and exercise.  COVID-19 counseling: Denise Sharp has been reluctant to proceed with COVID-19 vaccination.  We spoke at length regarding the risks and benefits of the currently available vaccines; I have encouraged her and her husband to proceed with vaccination at their earliest convenience.  Follow-up: Return to clinic shortly after completion of echocardiogram.  [name], MD 11/25/2019 3:24 PM

## 2019-11-25 ENCOUNTER — Encounter: Payer: Self-pay | Admitting: Internal Medicine

## 2019-11-25 ENCOUNTER — Ambulatory Visit (INDEPENDENT_AMBULATORY_CARE_PROVIDER_SITE_OTHER): Payer: Medicare Other | Admitting: Internal Medicine

## 2019-11-25 ENCOUNTER — Other Ambulatory Visit: Payer: Self-pay

## 2019-11-25 VITALS — BP 120/68 | HR 77 | Ht 64.5 in | Wt 253.0 lb

## 2019-11-25 DIAGNOSIS — R079 Chest pain, unspecified: Secondary | ICD-10-CM

## 2019-11-25 DIAGNOSIS — Z7189 Other specified counseling: Secondary | ICD-10-CM | POA: Diagnosis not present

## 2019-11-25 DIAGNOSIS — R0602 Shortness of breath: Secondary | ICD-10-CM

## 2019-11-25 MED ORDER — METOPROLOL SUCCINATE ER 25 MG PO TB24: 25.0000 mg | ORAL_TABLET | Freq: Every day | ORAL | 5 refills | Status: DC

## 2019-11-25 NOTE — Patient Instructions (Signed)
Medication Instructions:  Your physician has recommended you make the following change in your medication:  1- START Metoprolol succinate 25 mg (1 tablet) by mouth once a day.  *If you need a refill on your cardiac medications before your next appointment, please call your pharmacy*   Lab Work: none If you have labs (blood work) drawn today and your tests are completely normal, you will receive your results only by: [name] MyChart Message (if you have MyChart) OR . A paper copy in the mail If you have any lab test that is abnormal or we need to change your treatment, we will call you to review the results.   Testing/Procedures: Your physician has requested that you have an echocardiogram. Echocardiography is a painless test that uses sound waves to create images of your heart. It provides your doctor with information about the size and shape of your heart and how well your heart's chambers and valves are working. This procedure takes approximately one hour. There are no restrictions for this procedure. You may get an IV, if needed, to receive an ultrasound enhancing agent through to better visualize your heart.    Follow-Up: At [facility], you and your health needs are our priority.  As part of our continuing mission to provide you with exceptional heart care, we have created designated Provider Care Teams.  These Care Teams include your primary Cardiologist (physician) and Advanced Practice Providers (APPs -  Physician Assistants and Nurse Practitioners) who all work together to provide you with the care you need, when you need it.  We recommend signing up for the patient portal called "MyChart".  Sign up information is provided on this After Visit Summary.  MyChart is used to connect with patients for Virtual Visits (Telemedicine).  Patients are able to view lab/test results, encounter notes, upcoming appointments, etc.  Non-urgent messages can be sent to your provider as well.   To learn  more about what you can do with MyChart, go to [email or web address].    Your next appointment:   After echo.  The format for your next appointment:   In Person  Provider:    You may see [name], MD or one of the following Advanced Practice Providers on your designated Care Team:    Murray Hodgkins, NP  Christell Faith, PA-C  Marrianne Mood, PA-C    Echocardiogram An echocardiogram is a procedure that uses painless sound waves (ultrasound) to produce an image of the heart. Images from an echocardiogram can provide important information about:  Signs of coronary artery disease (CAD).  Aneurysm detection. An aneurysm is a weak or damaged part of an artery wall that bulges out from the normal force of blood pumping through the body.  Heart size and shape. Changes in the size or shape of the heart can be associated with certain conditions, including heart failure, aneurysm, and CAD.  Heart muscle function.  Heart valve function.  Signs of a past heart attack.  Fluid buildup around the heart.  Thickening of the heart muscle.  A tumor or infectious growth around the heart valves. Tell a health care provider about:  Any allergies you have.  All medicines you are taking, including vitamins, herbs, eye drops, creams, and over-the-counter medicines.  Any blood disorders you have.  Any surgeries you have had.  Any medical conditions you have.  Whether you are pregnant or may be pregnant. What are the risks? Generally, this is a safe procedure. However, problems may occur, including:  Allergic  reaction to dye (contrast) that may be used during the procedure. What happens before the procedure? No specific preparation is needed. You may eat and drink normally. What happens during the procedure?   An IV tube may be inserted into one of your veins.  You may receive contrast through this tube. A contrast is an injection that improves the quality of the pictures  from your heart.  A gel will be applied to your chest.  A wand-like tool (transducer) will be moved over your chest. The gel will help to transmit the sound waves from the transducer.  The sound waves will harmlessly bounce off of your heart to allow the heart images to be captured in real-time motion. The images will be recorded on a computer. The procedure may vary among health care providers and hospitals. What happens after the procedure?  You may return to your normal, everyday life, including diet, activities, and medicines, unless your health care provider tells you not to do that. Summary  An echocardiogram is a procedure that uses painless sound waves (ultrasound) to produce an image of the heart.  Images from an echocardiogram can provide important information about the size and shape of your heart, heart muscle function, heart valve function, and fluid buildup around your heart.  You do not need to do anything to prepare before this procedure. You may eat and drink normally.  After the echocardiogram is completed, you may return to your normal, everyday life, unless your health care provider tells you not to do that. This information is not intended to replace advice given to you by your health care provider. Make sure you discuss any questions you have with your health care provider. Document Revised: 10/23/2018 Document Reviewed: 08/04/2016 Elsevier Patient Education  [place].

## 2019-11-26 ENCOUNTER — Encounter: Payer: Self-pay | Admitting: Internal Medicine

## 2019-11-26 ENCOUNTER — Telehealth: Payer: Self-pay | Admitting: Internal Medicine

## 2019-11-26 NOTE — Telephone Encounter (Signed)
S/w patient. Took first dose of metoprolol succinate 25 mg this morning around 9 am. At 11 am patient described "it came on me quickly like a bolt of lightning." Experienced symptoms of chest pain, SOB, headache, dizzy, blurred vision, loss or bowel control and unable to walk on her own. "Felt like being hit in the chest with a baseball bat." She denies those symptoms now but dose still feel weak and fatigue.  Advised patient not to take anymore metoprolol succinate at this time and I will forward to Dr End for review. She was appreciative and will let us know if any symptoms return or go to ER if needed.

## 2019-11-26 NOTE — Telephone Encounter (Signed)
Patient notified and verbalized understanding of plan of care as he stated.

## 2019-11-26 NOTE — Telephone Encounter (Signed)
Pt c/o medication issue:  1. Name of Medication: metoprolol  2. How are you currently taking this medication (dosage and times per day)? 25 mg around 9  3. Are you having a reaction (difficulty breathing--STAT)? See below  4. What is your medication issue? Patient is super drowsy and was having chest pain, sob, nausea, dizzy all starting around 11

## 2019-11-26 NOTE — Telephone Encounter (Signed)
This constellation of symptoms seems unusual for a single dose of metoprolol but I agree with stopping this medication.  If symptoms recur, she should call 911.  If she does not feel back to her baseline by tomorrow afternoon, she should call us to schedule reevaluation in the office next week.  [name], MD [facility]

## 2019-11-27 ENCOUNTER — Telehealth: Payer: Self-pay | Admitting: Internal Medicine

## 2019-11-27 ENCOUNTER — Other Ambulatory Visit: Payer: Self-pay | Admitting: Internal Medicine

## 2019-11-27 ENCOUNTER — Other Ambulatory Visit: Payer: Medicare Other

## 2019-11-27 ENCOUNTER — Telehealth: Payer: Self-pay

## 2019-11-27 ENCOUNTER — Other Ambulatory Visit: Payer: Self-pay

## 2019-11-27 DIAGNOSIS — R748 Abnormal levels of other serum enzymes: Secondary | ICD-10-CM

## 2019-11-27 DIAGNOSIS — R059 Cough, unspecified: Secondary | ICD-10-CM

## 2019-11-27 DIAGNOSIS — R300 Dysuria: Secondary | ICD-10-CM

## 2019-11-27 DIAGNOSIS — E1142 Type 2 diabetes mellitus with diabetic polyneuropathy: Secondary | ICD-10-CM

## 2019-11-27 MED ORDER — DIPHENOXYLATE-ATROPINE 2.5-0.025 MG PO TABS: 1.0000 | ORAL_TABLET | Freq: Four times a day (QID) | ORAL | 0 refills | Status: DC | PRN

## 2019-11-27 MED ORDER — ESZOPICLONE 3 MG PO TABS: 3.0000 mg | ORAL_TABLET | Freq: Every day | ORAL | 1 refills | Status: DC

## 2019-11-27 MED ORDER — BENZONATATE 200 MG PO CAPS: 200.0000 mg | ORAL_CAPSULE | Freq: Two times a day (BID) | ORAL | 0 refills | Status: DC | PRN

## 2019-11-27 MED ORDER — DIAZEPAM 5 MG PO TABS: 5.0000 mg | ORAL_TABLET | Freq: Every evening | ORAL | 5 refills | Status: DC | PRN

## 2019-11-27 NOTE — Telephone Encounter (Signed)
Orders for urine has been placed per [name]

## 2019-11-27 NOTE — Addendum Note (Signed)
Addended by: Tor Netters I on: 11/27/2019 04:03 PM   Modules accepted: Orders

## 2019-11-27 NOTE — Telephone Encounter (Signed)
Pt called she thinks she has a UTI her urine in dark and smells and wants to drop a urine off   Pt also needs a refill on the following sent to [facility]  Eszopiclone 3 MG TABS diazepam (VALIUM) 5 MG tablet diphenoxylate-atropine (LOMOTIL) 2.5-0.025 MG tablet benzonatate (TESSALON) 200 MG capsule

## 2019-11-27 NOTE — Telephone Encounter (Signed)
THESE ARE ALL CONTROLLED SUBSTANCES. AND ONLY LUNESTA CAN BE FILLED MAIL ORDER

## 2019-11-27 NOTE — Addendum Note (Signed)
Addended by: Tor Netters I on: 11/27/2019 04:06 PM   Modules accepted: Orders

## 2019-11-27 NOTE — Addendum Note (Signed)
Addended by: Tor Netters I on: 11/27/2019 04:04 PM   Modules accepted: Orders

## 2019-11-27 NOTE — Telephone Encounter (Signed)
Is it okay for pt to come by the office and drop off a urine today and then schedule her for an appt next week?   I have refilled the benzonatate. Pt needs refills on Lunesta, diazepam and lomotil.

## 2019-11-27 NOTE — Telephone Encounter (Signed)
Spoke with pt and scheduled her for a lab appt today and let her know that the medications have been sent in.

## 2019-11-30 ENCOUNTER — Encounter: Payer: Self-pay | Admitting: Internal Medicine

## 2019-11-30 ENCOUNTER — Ambulatory Visit (INDEPENDENT_AMBULATORY_CARE_PROVIDER_SITE_OTHER): Payer: Medicare Other | Admitting: Internal Medicine

## 2019-11-30 VITALS — Ht 64.5 in | Wt 253.0 lb

## 2019-11-30 DIAGNOSIS — R1310 Dysphagia, unspecified: Secondary | ICD-10-CM

## 2019-11-30 DIAGNOSIS — R8290 Unspecified abnormal findings in urine: Secondary | ICD-10-CM

## 2019-11-30 DIAGNOSIS — R0609 Other forms of dyspnea: Secondary | ICD-10-CM

## 2019-11-30 DIAGNOSIS — Z794 Long term (current) use of insulin: Secondary | ICD-10-CM

## 2019-11-30 DIAGNOSIS — E1142 Type 2 diabetes mellitus with diabetic polyneuropathy: Secondary | ICD-10-CM

## 2019-11-30 DIAGNOSIS — R0600 Dyspnea, unspecified: Secondary | ICD-10-CM | POA: Diagnosis not present

## 2019-11-30 LAB — URINE CULTURE: Organism ID, Bacteria: NO GROWTH

## 2019-11-30 LAB — URINALYSIS, ROUTINE W REFLEX MICROSCOPIC

## 2019-11-30 NOTE — Assessment & Plan Note (Signed)
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  encouraged to follow up with Dr Annamaria Boots.

## 2019-11-30 NOTE — Progress Notes (Signed)
Telephone  Note  This visit type was conducted due to national recommendations for restrictions regarding the COVID-19 pandemic (e.g. social distancing).  This format is felt to be most appropriate for this patient at this time.  All issues noted in this document were discussed and addressed.  No physical exam was performed (except for noted visual exam findings with Video Visits).   I connected with@ on 11/30/19 at  2:00 PM EDT by  telephone and verified that I am speaking with the correct person using two identifiers. Location patient: home Location provider: work or home office Persons participating in the virtual visit: patient, provider  I discussed the limitations, risks, security and privacy concerns of performing an evaluation and management service by telephone and the availability of in person appointments. I also discussed with the patient that there may be a patient responsible charge related to this service. The patient expressed understanding and agreed to proceed.  Reason for visit: foul odor of urine   HPI: 74 yr old female with history of bariatric surgery x 2,  Morbid obesity ,  Recent p panniculectomy,  Type 2 DM, OHS/OSA and chronic insomnia.    Not  feeling well today. has been short of breath and having chest pain,  Feels like taking a breath requires a lot of effort,  Just walking from one end of house to the other causes  Her heart rate to jump but does not have a pulse ox.    States that she is unable to get out of bed unless husband is home due to her condition and fear of dyspnea,  Exertional dyspnea:  Etiology unclear per pulmonology and cardiology. Had CT of chest but no in person visit due to fever.  toprol xl 25 mg and echo ordered by dr end on May 11 .  After taking one dose "I nearly died ." Developed trouble breathing,  Double vision , headache   But did not check blood pressure . Was told to stay in bed the rest of the day and rest  Feels she is wheezing  that can be heard "from across the room."  PFTS normal .,  CT without evidence of asthma or COPD .  Discussed vocal cord dysfunction wheezing. as a potential cause of her audible   Choked recently on a piece of chicken that was dry. Has had 8 or 9 episode sod chocking In the last 2 months,  Including liquids.   History of multiple esophageal dilations in the past    Lab Results  Component Value Date   HGBA1C 7.2 (H) 06/09/2019    .   ROS: See pertinent positives and negatives per HPI.  Past Medical History:  Diagnosis Date  . Abnormality of gait 03/25/2013  . Adrenal mass, left ([place])   . Anemia    iron deficient post  2 unit txfsn 2009, normal endo/colonoscopy by [facility]  . Arthritis   . Atypical chest pain    a. 04/1986 Cath ([facility]): nl cors, EF 65%; b. 07/2012 Cath [facility]): LM nl, LAD/LCX/RCA w/ min irregs; c. 07/2016 MV [facility]): "Equivocal" w/ small inf wall rev defect and hyperdynamic LV contraction, EF 87%; d. 08/2016 Cardiac CT Ca2+ score [facility]): Ca2+ score 1548.  [name] Cervical spinal stenosis 1994   due to trauma to back ([employer] accident), has intermittent paralysis and parasthesias  . Cervicogenic headache 03/23/2014  . Depression   . Dizziness    chronic dizziness  . DJD (degenerative joint disease)  a. Chronic R shoulder pain pending R shoulder replacement 07/2017.  [name] Esophageal stenosis March 2011   with transietn outlet obstruction by food, cleared by EGD   . Family history of adverse reaction to anesthesia    daughter PONV  . Gastric bypass status for obesity   . Headache(784.0)   . Hypertension   . IBS (irritable bowel syndrome)   . Left bundle branch block (LBBB)    a. Intermittently present - likely rate related. - patient denies  . Obesity   . Obstructive sleep apnea    using CPAP  . 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 Restless leg syndrome   . Rotator cuff arthropathy, right 08/13/2017  . Syncope and collapse 03/12/2014  . Type II diabetes mellitus ([place])      Past Surgical History:  Procedure Laterality Date  . ABDOMINAL HYSTERECTOMY    . APPENDECTOMY    . COLONOSCOPY    . DIAPHRAGMATIC HERNIA REPAIR  2015  . ESOPHAGEAL DILATION     multiple  . ESOPHAGOGASTRODUODENOSCOPY    . [place]  . GALLBLADDER SURGERY  resection  . GASTRIC BYPASS    . GASTRIC BYPASS  2000, 2005   Dr. Debroah Loop  . [facility] IMPLANT PLACEMENT  April 2013   [place]  . JOINT REPLACEMENT  2007   bilateral knee. [name],  [name]  . PANNICULECTOMY  06/16/2019  . PANNICULECTOMY N/A 06/16/2019   Procedure: PANNICULECTOMY;  Surgeon: [name], MD;  Location: [place];  Service: Plastics;  Laterality: N/A;  3 hours, please  . REVERSE SHOULDER ARTHROPLASTY Right 08/13/2017   Procedure: REVERSE RIGHT SHOULDER ARTHROPLASTY;  Surgeon: [name], MD;  Location: [place];  Service: Orthopedics;  Laterality: Right;  . ROTATOR CUFF REPAIR     right  . [place]  . TOTAL ABDOMINAL HYSTERECTOMY W/ BILATERAL SALPINGOOPHORECTOMY  1974  . UMBILICAL HERNIA REPAIR  Aug 11, 2015    Family History  Problem Relation Age of Onset  . Heart disease Father   . Hypertension Father   . Prostate cancer Father   . Stroke Father   . Osteoporosis Father   . Stroke Mother   . Depression Mother   . Headache Mother   . Heart disease Mother   . Thyroid disease Mother   . Hypertension Mother   . Diabetes Daughter   . Heart disease Daughter   . Hypertension Daughter   . Hypertension Son     SOCIAL HX:  reports that she has never smoked. She has never used smokeless tobacco. She reports that she does not drink alcohol or use drugs.  Current Outpatient Medications:  .  acetaminophen (TYLENOL) 650 MG CR tablet, Take 650 mg by mouth every 8 (eight) hours as needed for pain., Disp: , Rfl:  .  albuterol (VENTOLIN HFA) 108 (90 Base) MCG/ACT inhaler, Inhale 2 puffs into the lungs every 8 (eight) hours as needed for wheezing or shortness of breath., Disp: 8  g, Rfl: 2 .  aspirin EC 81 MG tablet, Take 1 tablet (81 mg total) by mouth daily., Disp: 90 tablet, Rfl: 3 .  benzonatate (TESSALON) 200 MG capsule, Take 1 capsule (200 mg total) by mouth 2 (two) times daily as needed for cough., Disp: 30 capsule, Rfl: 0 .  blood glucose meter kit and supplies, Use to check sugars up to three times daily. Dx code: E11.9, Disp: 1 each, Rfl: 0 .  Blood Glucose Monitoring Suppl (ONETOUCH VERIO)  w/Device KIT, 1 Device by Does not apply route daily. Use to test blood glucose once daily. Dx code = E11.9, Disp: 1 kit, Rfl: 0 .  buPROPion (WELLBUTRIN XL) 150 MG 24 hr tablet, Take 1 tablet (150 mg total) by mouth daily., Disp: 90 tablet, Rfl: 1 .  buPROPion (WELLBUTRIN) 75 MG tablet, TAKE 1-2 TABLETS (75-150 MG TOTAL) BY MOUTH SEE ADMIN INSTRUCTIONS. TAKE 2 TABLETS IN THE MORNING AND 1 TABLET IN THE AFTERNOON, Disp: 270 tablet, Rfl: 1 .  butalbital-acetaminophen-caffeine (FIORICET) 50-325-40 MG tablet, Take 1 tablet by mouth every 6 (six) hours as needed for headache., Disp: 90 tablet, Rfl: 5 .  cholestyramine light (PREVALITE) 4 g packet, Take 4 g by mouth 3 (three) times daily as needed (diarrhea). , Disp: , Rfl:  .  citalopram (CELEXA) 10 MG tablet, Take 1 tablet (10 mg total) by mouth daily. (Patient taking differently: Take 10 mg by mouth every evening. ), Disp: 90 tablet, Rfl: 3 .  clotrimazole-betamethasone (LOTRISONE) cream, Apply 1 application topically 2 (two) times daily. (Patient taking differently: Apply 1 application topically 2 (two) times daily as needed (rash). ), Disp: 45 g, Rfl: 3 .  Continuous Blood Gluc Receiver (FREESTYLE LIBRE 14 DAY READER) DEVI, 1 applicator by Does not apply route 4 (four) times daily., Disp: 1 each, Rfl: 6 .  Continuous Blood Gluc Sensor (FREESTYLE LIBRE 14 DAY SENSOR) MISC, Use to test blood sugar 4 times daily, Disp: 1 each, Rfl: 6 .  cyanocobalamin (,VITAMIN B-12,) 1000 MCG/ML injection, Inject 1 ml (1000 mcg ) IM weekly x 4,  Then  monthly thereafter (Patient taking differently: Inject 1,000 mcg into the muscle every 30 (thirty) days. On the 12th day), Disp: 110 mL, Rfl: 0 .  diazepam (VALIUM) 5 MG tablet, Take 1 tablet (5 mg total) by mouth at bedtime as needed for anxiety., Disp: 30 tablet, Rfl: 5 .  dicyclomine (BENTYL) 10 MG capsule, Take 1 capsule (10 mg total) by mouth 4 (four) times daily -  before meals and at bedtime., Disp: 360 capsule, Rfl: 3 .  diphenhydrAMINE (BENADRYL) 25 MG tablet, Take 50 mg by mouth daily as needed for allergies., Disp: , Rfl:  .  diphenoxylate-atropine (LOMOTIL) 2.5-0.025 MG tablet, Take 1 tablet by mouth 4 (four) times daily as needed for diarrhea or loose stools., Disp: 60 tablet, Rfl: 0 .  Eszopiclone 3 MG TABS, Take 1 tablet (3 mg total) by mouth at bedtime., Disp: 90 tablet, Rfl: 1 .  Insulin Glargine (LANTUS SOLOSTAR) 100 UNIT/ML Solostar Pen, Inject 11 Units into the skin daily. (Patient taking differently: Inject 10-13 Units into the skin daily. ), Disp: 5 pen, Rfl: 3 .  Insulin Pen Needle 31G X 5 MM MISC, Use to inject insulin daily, Disp: 30 each, Rfl: 1 .  ipratropium (ATROVENT) 0.06 % nasal spray, Place 2 sprays into both nostrils 4 (four) times daily., Disp: 15 mL, Rfl: 12 .  Lancet Devices (ONE TOUCH DELICA LANCING DEV) MISC, Use to test blood glucose once daily. Dx code = E11.9, Disp: 1 each, Rfl: 0 .  levETIRAcetam (KEPPRA) 750 MG tablet, Take 1 tablet (750 mg total) by mouth at bedtime. (Patient taking differently: Take 750 mg by mouth at bedtime as needed (restless legs). ), Disp: 90 tablet, Rfl: 3 .  NONFORMULARY OR COMPOUNDED ITEM, [facility]  Combination Pain Cream -  Baclofen 2%, Doxepin 5%, Gabapentin 6%, Topiramate 2%, Pentoxifylline 3% Apply 1-2 grams to affected area 3-4 times daily Qty. 120 gm  3 refills, Disp: 1 each, Rfl: 3 .  nystatin (NYAMYC) powder, Apply 1 g topically 4 (four) times daily as needed (rash). , Disp: , Rfl:  .  ondansetron (ZOFRAN ODT) 4 MG  disintegrating tablet, Take 1 tablet (4 mg total) by mouth every 8 (eight) hours as needed for nausea or vomiting., Disp: 20 tablet, Rfl: 0 .  OVER THE COUNTER MEDICATION, Apply 1 application topically daily as needed (pain). [place], Disp: , Rfl:  .  pregabalin (LYRICA) 100 MG capsule, Take 100 mg by mouth at bedtime as needed (leg cramps). , Disp: , Rfl:  .  Syringe/Needle, Disp, (SYRINGE 3CC/25GX1") 25G X 1" 3 ML MISC, Use for b12 injections, Disp: 50 each, Rfl: 0 .  traMADol (ULTRAM) 50 MG tablet, Take 0.5 tablets (25 mg total) by mouth every 12 (twelve) hours as needed for severe pain., Disp: 30 tablet, Rfl: 0 .  umeclidinium-vilanterol (ANORO ELLIPTA) 62.5-25 MCG/INH AEPB, Inhale 1 puff into the lungs daily., Disp: 1 each, Rfl: 12  EXAM:  VITALS per patient if applicable:  GENERAL: alert, oriented, appears well and in no acute distress  HEENT: atraumatic, conjunttiva clear, no obvious abnormalities on inspection of external nose and ears  NECK: normal movements of the head and neck  LUNGS: on inspection no signs of respiratory distress, breathing rate appears normal, no obvious gross SOB, gasping or wheezing  CV: no obvious cyanosis  MS: moves all visible extremities without noticeable abnormality  PSYCH/NEURO: pleasant and cooperative, no obvious depression or anxiety, speech and thought processing grossly intact  ASSESSMENT AND PLAN:  Discussed the following assessment and plan:  Bad odor of urine - Plan: CBC with Differential/Platelet, Urinalysis, Routine w reflex microscopic, Urine Culture  Controlled type 2 diabetes mellitus with diabetic polyneuropathy, with long-term current use of insulin (HCC) - Plan: Hemoglobin A1c, Comprehensive metabolic panel  Dysphagia, unspecified type - Plan: Ambulatory referral to Gastroenterology  Exertional dyspnea  Morbid obesity ([place])  Exertional dyspnea Thus far no etiology found other than severe deconditioning and 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  encouraged to follow up with Dr Annamaria Boots.   Morbid obesity ([place]) She continues to gain weight due to lack of exercise . Encouraged to start with very short walks to avoid the reactions she continually describes when she exerts herself  Dysphagia She now reports dysphagia for solids and liquids and has a history of esophageal dilation "so many times they said I couldn't have anymore"  Referral to Dr Allen Norris for evaluation   Bad odor of urine Likely due to body habitus and/or food choices.  No evidence of UTI by recent culture.     I discussed the assessment and treatment plan with the patient. The patient was provided an opportunity to ask questions and all were answered. The patient agreed with the plan and demonstrated an understanding of the instructions.   The patient was advised to call back or seek an in-person evaluation if the symptoms worsen or if the condition fails to improve as anticipated.  I provided  30 minutes of non-face-to-face time during this encounter reviewing patient's current problems and past surgeries, labs and imaging studies, providing counseling on the above mentioned problems , and coordination  of care .  [name], MD

## 2019-11-30 NOTE — Assessment & Plan Note (Signed)
Likely due to body habitus and/or food choices.  No evidence of UTI by recent culture.

## 2019-11-30 NOTE — Assessment & Plan Note (Signed)
She now reports dysphagia for solids and liquids and has a history of esophageal dilation "so many times they said I couldn't have anymore"  Referral to Dr Allen Norris for evaluation

## 2019-11-30 NOTE — Assessment & Plan Note (Signed)
She continues to gain weight due to lack of exercise . Encouraged to start with very short walks to avoid the reactions she continually describes when she exerts herself

## 2019-12-22 ENCOUNTER — Telehealth: Payer: Self-pay | Admitting: Internal Medicine

## 2019-12-22 NOTE — Telephone Encounter (Signed)
Spoke to Denise Sharp. She was highly upset that the AWV needed to be a virtual visit. 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 I tried to explain that this visit was not her physical.   I told Denise Sharp I would have someone at office call and reschedule the cpe with her PCP.  I could not hardly talk with out her interrupting and she eventually hung up.   Please advise

## 2019-12-28 ENCOUNTER — Ambulatory Visit: Payer: Medicare Other

## 2019-12-30 ENCOUNTER — Other Ambulatory Visit: Payer: Self-pay

## 2019-12-30 ENCOUNTER — Inpatient Hospital Stay
Admission: EM | Admit: 2019-12-30 | Discharge: 2020-01-05 | DRG: 247 | Disposition: A | Discharge status: Home Health | Payer: Medicare Other | Attending: Internal Medicine | Admitting: Internal Medicine

## 2019-12-30 ENCOUNTER — Emergency Department: Payer: Medicare Other

## 2019-12-30 ENCOUNTER — Other Ambulatory Visit: Payer: Self-pay | Admitting: *Deleted

## 2019-12-30 ENCOUNTER — Telehealth: Payer: Self-pay | Admitting: Internal Medicine

## 2019-12-30 ENCOUNTER — Ambulatory Visit (INDEPENDENT_AMBULATORY_CARE_PROVIDER_SITE_OTHER): Payer: Medicare Other

## 2019-12-30 DIAGNOSIS — E785 Hyperlipidemia, unspecified: Secondary | ICD-10-CM | POA: Diagnosis present

## 2019-12-30 DIAGNOSIS — Z6841 Body Mass Index (BMI) 40.0 and over, adult: Secondary | ICD-10-CM

## 2019-12-30 DIAGNOSIS — Z9049 Acquired absence of other specified parts of digestive tract: Secondary | ICD-10-CM

## 2019-12-30 DIAGNOSIS — Z9989 Dependence on other enabling machines and devices: Secondary | ICD-10-CM

## 2019-12-30 DIAGNOSIS — Z882 Allergy status to sulfonamides status: Secondary | ICD-10-CM

## 2019-12-30 DIAGNOSIS — H9191 Unspecified hearing loss, right ear: Secondary | ICD-10-CM | POA: Diagnosis present

## 2019-12-30 DIAGNOSIS — K219 Gastro-esophageal reflux disease without esophagitis: Secondary | ICD-10-CM | POA: Diagnosis present

## 2019-12-30 DIAGNOSIS — Z8249 Family history of ischemic heart disease and other diseases of the circulatory system: Secondary | ICD-10-CM

## 2019-12-30 DIAGNOSIS — Z9884 Bariatric surgery status: Secondary | ICD-10-CM

## 2019-12-30 DIAGNOSIS — G2581 Restless legs syndrome: Secondary | ICD-10-CM | POA: Diagnosis present

## 2019-12-30 DIAGNOSIS — Z7982 Long term (current) use of aspirin: Secondary | ICD-10-CM

## 2019-12-30 DIAGNOSIS — I25110 Atherosclerotic heart disease of native coronary artery with unstable angina pectoris: Secondary | ICD-10-CM | POA: Diagnosis not present

## 2019-12-30 DIAGNOSIS — E1165 Type 2 diabetes mellitus with hyperglycemia: Secondary | ICD-10-CM

## 2019-12-30 DIAGNOSIS — G4700 Insomnia, unspecified: Secondary | ICD-10-CM | POA: Diagnosis present

## 2019-12-30 DIAGNOSIS — R627 Adult failure to thrive: Secondary | ICD-10-CM | POA: Diagnosis present

## 2019-12-30 DIAGNOSIS — R159 Full incontinence of feces: Secondary | ICD-10-CM | POA: Diagnosis present

## 2019-12-30 DIAGNOSIS — R1310 Dysphagia, unspecified: Secondary | ICD-10-CM | POA: Diagnosis present

## 2019-12-30 DIAGNOSIS — F411 Generalized anxiety disorder: Secondary | ICD-10-CM | POA: Diagnosis present

## 2019-12-30 DIAGNOSIS — Z79899 Other long term (current) drug therapy: Secondary | ICD-10-CM

## 2019-12-30 DIAGNOSIS — F329 Major depressive disorder, single episode, unspecified: Secondary | ICD-10-CM | POA: Diagnosis present

## 2019-12-30 DIAGNOSIS — Z96653 Presence of artificial knee joint, bilateral: Secondary | ICD-10-CM | POA: Diagnosis present

## 2019-12-30 DIAGNOSIS — I10 Essential (primary) hypertension: Secondary | ICD-10-CM | POA: Diagnosis present

## 2019-12-30 DIAGNOSIS — R0602 Shortness of breath: Secondary | ICD-10-CM | POA: Diagnosis not present

## 2019-12-30 DIAGNOSIS — I208 Other forms of angina pectoris: Secondary | ICD-10-CM | POA: Diagnosis present

## 2019-12-30 DIAGNOSIS — E1142 Type 2 diabetes mellitus with diabetic polyneuropathy: Secondary | ICD-10-CM | POA: Diagnosis present

## 2019-12-30 DIAGNOSIS — Z833 Family history of diabetes mellitus: Secondary | ICD-10-CM

## 2019-12-30 DIAGNOSIS — I200 Unstable angina: Secondary | ICD-10-CM | POA: Diagnosis not present

## 2019-12-30 DIAGNOSIS — Z881 Allergy status to other antibiotic agents status: Secondary | ICD-10-CM

## 2019-12-30 DIAGNOSIS — K589 Irritable bowel syndrome without diarrhea: Secondary | ICD-10-CM | POA: Diagnosis present

## 2019-12-30 DIAGNOSIS — Z20822 Contact with and (suspected) exposure to covid-19: Secondary | ICD-10-CM | POA: Diagnosis present

## 2019-12-30 DIAGNOSIS — R079 Chest pain, unspecified: Secondary | ICD-10-CM

## 2019-12-30 DIAGNOSIS — E1140 Type 2 diabetes mellitus with diabetic neuropathy, unspecified: Secondary | ICD-10-CM

## 2019-12-30 DIAGNOSIS — M4802 Spinal stenosis, cervical region: Secondary | ICD-10-CM | POA: Diagnosis present

## 2019-12-30 DIAGNOSIS — G4733 Obstructive sleep apnea (adult) (pediatric): Secondary | ICD-10-CM

## 2019-12-30 DIAGNOSIS — Z888 Allergy status to other drugs, medicaments and biological substances status: Secondary | ICD-10-CM

## 2019-12-30 DIAGNOSIS — Z96611 Presence of right artificial shoulder joint: Secondary | ICD-10-CM | POA: Diagnosis present

## 2019-12-30 DIAGNOSIS — I951 Orthostatic hypotension: Secondary | ICD-10-CM | POA: Diagnosis present

## 2019-12-30 DIAGNOSIS — Z794 Long term (current) use of insulin: Secondary | ICD-10-CM

## 2019-12-30 DIAGNOSIS — E1149 Type 2 diabetes mellitus with other diabetic neurological complication: Secondary | ICD-10-CM

## 2019-12-30 DIAGNOSIS — Z818 Family history of other mental and behavioral disorders: Secondary | ICD-10-CM

## 2019-12-30 HISTORY — DX: Other ill-defined heart diseases: I51.89

## 2019-12-30 LAB — CBC
HCT: 34.9 % — ABNORMAL LOW (ref 36.0–46.0)
Hemoglobin: 11.3 g/dL — ABNORMAL LOW (ref 12.0–15.0)
MCH: 25.7 pg — ABNORMAL LOW (ref 26.0–34.0)
MCHC: 32.4 g/dL (ref 30.0–36.0)
MCV: 79.5 fL — ABNORMAL LOW (ref 80.0–100.0)
Platelets: 366 10*3/uL (ref 150–400)
RBC: 4.39 MIL/uL (ref 3.87–5.11)
RDW: 17.2 % — ABNORMAL HIGH (ref 11.5–15.5)
WBC: 10.2 10*3/uL (ref 4.0–10.5)
nRBC: 0 % (ref 0.0–0.2)

## 2019-12-30 LAB — GLUCOSE, CAPILLARY: Glucose-Capillary: 90 mg/dL (ref 70–99)

## 2019-12-30 LAB — TROPONIN I (HIGH SENSITIVITY)
Troponin I (High Sensitivity): 7 ng/L (ref <18)
Troponin I (High Sensitivity): 9 ng/L (ref <18)

## 2019-12-30 LAB — BASIC METABOLIC PANEL
Anion gap: 11 (ref 5–15)
BUN: 13 mg/dL (ref 8–23)
CO2: 23 mmol/L (ref 22–32)
Calcium: 8.7 mg/dL — ABNORMAL LOW (ref 8.9–10.3)
Chloride: 107 mmol/L (ref 98–111)
Creatinine, Ser: 1.06 mg/dL — ABNORMAL HIGH (ref 0.44–1.00)
GFR calc Af Amer: 60 mL/min — ABNORMAL LOW (ref >60)
GFR calc non Af Amer: 52 mL/min — ABNORMAL LOW (ref >60)
Glucose, Bld: 157 mg/dL — ABNORMAL HIGH (ref 70–99)
Potassium: 4.6 mmol/L (ref 3.5–5.1)
Sodium: 141 mmol/L (ref 135–145)

## 2019-12-30 MED ORDER — SODIUM CHLORIDE 0.9% FLUSH
3.0000 mL | Freq: Once | INTRAVENOUS | Status: AC
  Administered 2019-12-31: 3 mL via INTRAVENOUS

## 2019-12-30 MED ORDER — INSULIN PEN NEEDLE 31G X 5 MM MISC: 1 refills | Status: DC

## 2019-12-30 MED ORDER — NITROGLYCERIN 2 % TD OINT
0.5000 [in_us] | TOPICAL_OINTMENT | Freq: Once | TRANSDERMAL | Status: AC
  Administered 2019-12-30: 0.5 [in_us] via TOPICAL
  Filled 2019-12-30: qty 1

## 2019-12-30 MED ORDER — ASPIRIN 81 MG PO CHEW
324.0000 mg | CHEWABLE_TABLET | Freq: Once | ORAL | Status: AC
  Administered 2019-12-30: 324 mg via ORAL
  Filled 2019-12-30: qty 4

## 2019-12-30 MED ORDER — PERFLUTREN LIPID MICROSPHERE
1.0000 mL | INTRAVENOUS | Status: DC | PRN
  Administered 2019-12-30: 2 mL via INTRAVENOUS

## 2019-12-30 NOTE — ED Triage Notes (Signed)
Pt arrives from Dr. Darnelle Bos office with c/o chest pain, coughing, leg swelling and shob. Pt had normal EKG in office today and ECHO has not been resulted yet. Pt told by Dr. Saunders Revel that she need evaluation in the ER. Pt speaking in full sentences without shob, pt in NAD.

## 2019-12-30 NOTE — Telephone Encounter (Signed)
Patient's BP was 138/82. Patient was transported via wheelchair to the ER for further evaluation.

## 2019-12-30 NOTE — ED Notes (Signed)
Report received from [facility]. Patient care assumed. Patient/RN introduction complete. Will continue to monitor.

## 2019-12-30 NOTE — Telephone Encounter (Signed)
Pt needs a refill on Insulin Pen Needle 31G X 5 MM MISC sent to [facility]

## 2019-12-30 NOTE — ED Notes (Signed)
Pt upset that she has to continue to wait in the [place] after being brought over by cardiology. This RN explained to patient that her care had been started which included EKG, labs and a quick triage assessment. Pt reports her cardiologist told her she was being brought to the ED for a direct admission and would go to a room. This RN was apologetic to pt. Offered other comfort measures while she waits. RN explained the different care areas of the ED and why it may seem that others get in and out quickly. This RN very apologetic to pt.

## 2019-12-30 NOTE — ED Provider Notes (Signed)
[facility] Emergency Department Provider Note   ____________________________________________   First MD Initiated Contact with Patient 12/30/19 2300     (approximate)  I have reviewed the triage vital signs and the nursing notes.   HISTORY  Chief Complaint Chest Pain    HPI Denise Sharp is a 74 y.o. female sent to the ED from Dr. Darnelle Bos office for admission.  Patient with a history of chest pain, OSA, type 2 diabetes who has been experiencing waxing/waning chest pain with shortness of breath over the past month.  She was at Dr. Darnelle Bos office today for an echocardiogram when she noted that her symptoms were worsening.  At his insistence, patient was sent to the ED for work-up and admission with probable cardiac catheterization during her hospital stay.  Patient reports she went to [place] over the weekend and since she has been back both lower legs have been more swollen than usual.  Denies calf pain.  Denies fever, cough, abdominal pain, nausea, vomiting or dizziness.       Past Medical History:  Diagnosis Date  . Abnormality of gait 03/25/2013  . Adrenal mass, left ([place])   . Anemia    iron deficient post  2 unit txfsn 2009, normal endo/colonoscopy by [facility]  . Arthritis   . Atypical chest pain    a. 04/1986 Cath ([facility]): nl cors, EF 65%; b. 07/2012 Cath [facility]): LM nl, LAD/LCX/RCA w/ min irregs; c. 07/2016 MV [facility]): "Equivocal" w/ small inf wall rev defect and hyperdynamic LV contraction, EF 87%; d. 08/2016 Cardiac CT Ca2+ score [facility]): Ca2+ score 1548.  [name] Cervical spinal stenosis 1994   due to trauma to back ([employer] accident), has intermittent paralysis and parasthesias  . Cervicogenic headache 03/23/2014  . Depression   . Dizziness    chronic dizziness  . DJD (degenerative joint disease)    a. Chronic R shoulder pain pending R shoulder replacement 07/2017.  [name] Esophageal stenosis March 2011   with transietn outlet obstruction by food, cleared by EGD   .  Family history of adverse reaction to anesthesia    daughter PONV  . Gastric bypass status for obesity   . Headache(784.0)   . Hypertension   . IBS (irritable bowel syndrome)   . Left bundle branch block (LBBB)    a. Intermittently present - likely rate related. - patient denies  . Obesity   . Obstructive sleep apnea    using CPAP  . 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 Restless leg syndrome   . Rotator cuff arthropathy, right 08/13/2017  . Syncope and collapse 03/12/2014  . Type II diabetes mellitus [facility])     Patient Active Problem List   Diagnosis Date Noted  . Dysphagia 11/30/2019  . Bad odor of urine 11/30/2019  . Pruritus 11/06/2019  . Abdominal pain 08/23/2019  . Gastritis 08/07/2019  . Advice given about COVID-19 virus by telephone 08/07/2019  . S/P panniculectomy 06/16/2019  . Major depressive disorder with current active episode 05/30/2019  . Exposure to COVID-19 virus 12/25/2018  . Functional diarrhea 10/22/2018  . Preoperative clearance 08/23/2018  . Bilateral cataracts 06/26/2018  . Status post bariatric surgery 02/05/2018  . GAD (generalized anxiety disorder) 02/01/2018  . Rectocele 01/24/2018  . Vaginal prolapse 01/04/2018  . Hepatic steatosis 01/04/2018  . Microalbuminuria due to type 2 diabetes mellitus ([place]) 11/21/2017  . Hospital discharge follow-up 09/22/2017  . Hypocalcemia 09/22/2017  . Fecal incontinence 09/22/2017  . B12 deficiency 09/22/2017  . Rotator  cuff arthropathy, right 08/13/2017  . Primary localized osteoarthrosis of shoulder 08/13/2017  . Encounter for preoperative examination for general surgical procedure 08/03/2017  . 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 Candidiasis, intertrigo 08/03/2017  . 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 Atypical chest pain 10/16/2016  . Edema 12/13/2015  . Hypersomnia, persistent 06/18/2015  . Mechanical breakdown of implanted  electronic neurostimulator of peripheral nerve ([place]) 10/16/2014  . Obesity hypoventilation syndrome ([place]) 08/13/2014  . Frequent falls 06/23/2014  . Chronic cough 05/05/2014  . Adenoma of left adrenal gland 03/24/2014  . Syncope and collapse 03/12/2014  . Vitamin D deficiency 01/06/2014  . Pannus, abdominal 12/23/2013  . Weight gain following gastric bypass surgery 12/03/2013  . Morbid obesity ([place]) 08/25/2013  . Diaphragmatic hernia 04/06/2013  . Diabetic polyneuropathy ([place]) 03/25/2013  . Abnormality of gait 03/25/2013  . Multiple pulmonary nodules 03/20/2013  . Exertional dyspnea 03/20/2013  . Anemia 08/06/2012  . Iron deficiency anemia 08/06/2012  . Functional disorder of bladder 07/15/2012  . Incomplete bladder emptying 07/15/2012  . 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 Urge incontinence 07/15/2012  . Increased frequency of urination 07/15/2012  . OSA on CPAP 03/02/2012  . Insomnia secondary to anxiety 02/29/2012  . Sciatica 09/05/2011  . Cervical stenosis of spinal canal 06/14/2011  . Restless legs syndrome 03/13/2011  . Degenerative disk disease 03/13/2011  . Essential hypertension 03/12/2011  . Diabetes mellitus type II, controlled ([place]) 03/12/2011  . Hearing loss, right 03/12/2011    Past Surgical History:  Procedure Laterality Date  . ABDOMINAL HYSTERECTOMY    . APPENDECTOMY    . COLONOSCOPY    . DIAPHRAGMATIC HERNIA REPAIR  2015  . ESOPHAGEAL DILATION     multiple  . ESOPHAGOGASTRODUODENOSCOPY    . [place]  . GALLBLADDER SURGERY  resection  . GASTRIC BYPASS    . GASTRIC BYPASS  2000, 2005   Dr. Debroah Loop  . [facility] IMPLANT PLACEMENT  April 2013   [place]  . JOINT REPLACEMENT  2007   bilateral knee. [name],  [name]  . PANNICULECTOMY  06/16/2019  . PANNICULECTOMY N/A 06/16/2019   Procedure: PANNICULECTOMY;  Surgeon: [name], MD;  Location: [place];  Service: Plastics;  Laterality: N/A;  3 hours, please  . REVERSE  SHOULDER ARTHROPLASTY Right 08/13/2017   Procedure: REVERSE RIGHT SHOULDER ARTHROPLASTY;  Surgeon: [name], MD;  Location: [place];  Service: Orthopedics;  Laterality: Right;  . ROTATOR CUFF REPAIR     right  . [place]  . TOTAL ABDOMINAL HYSTERECTOMY W/ BILATERAL SALPINGOOPHORECTOMY  1974  . UMBILICAL HERNIA REPAIR  Aug 11, 2015    Prior to Admission medications   Medication Sig Start Date End Date Taking? Authorizing Provider  acetaminophen (TYLENOL) 650 MG CR tablet Take 650 mg by mouth every 8 (eight) hours as needed for pain.    [provider]  albuterol (VENTOLIN HFA) 108 (90 Base) MCG/ACT inhaler Inhale 2 puffs into the lungs every 8 (eight) hours as needed for wheezing or shortness of breath. 08/07/19   [name], MD  aspirin EC 81 MG tablet Take 1 tablet (81 mg total) by mouth daily. 03/06/17   End, Harrell Gave, MD  benzonatate (TESSALON) 200 MG capsule Take 1 capsule (200 mg total) by mouth 2 (two) times daily as needed for cough. 11/27/19   [name], MD  blood glucose meter kit and supplies Use  to check sugars up to three times daily. Dx code: E11.9 10/22/19   [name], MD  Blood Glucose Monitoring Suppl (ONETOUCH VERIO) w/Device KIT 1 Device by Does not apply route daily. Use to test blood glucose once daily. Dx code = E11.9 12/30/18   [name], MD  buPROPion (WELLBUTRIN XL) 150 MG 24 hr tablet Take 1 tablet (150 mg total) by mouth daily. 05/28/19   [name], MD  buPROPion (WELLBUTRIN) 75 MG tablet TAKE 1-2 TABLETS (75-150 MG TOTAL) BY MOUTH SEE ADMIN INSTRUCTIONS. TAKE 2 TABLETS IN THE MORNING AND 1 TABLET IN THE AFTERNOON 08/05/19   [name], MD  butalbital-acetaminophen-caffeine (FIORICET) [phone] MG tablet Take 1 tablet by mouth every 6 (six) hours as needed for headache. 05/28/19   [name], MD  cholestyramine light (PREVALITE) 4 g packet Take 4 g by mouth 3 (three) times daily as needed (diarrhea).   09/10/18   [provider]  citalopram (CELEXA) 10 MG tablet Take 1 tablet (10 mg total) by mouth daily. Patient taking differently: Take 10 mg by mouth every evening.  04/17/19   [name], MD  clotrimazole-betamethasone (LOTRISONE) cream Apply 1 application topically 2 (two) times daily. Patient taking differently: Apply 1 application topically 2 (two) times daily as needed (rash).  01/24/18   Gae Dry, MD  Continuous Blood Gluc Receiver (FREESTYLE LIBRE 14 DAY READER) DEVI 1 applicator by Does not apply route 4 (four) times daily. 10/02/19   [name], MD  Continuous Blood Gluc Sensor (FREESTYLE LIBRE 14 DAY SENSOR) MISC Use to test blood sugar 4 times daily 10/02/19   [name], MD  cyanocobalamin (,VITAMIN B-12,) 1000 MCG/ML injection Inject 1 ml (1000 mcg ) IM weekly x 4,  Then monthly thereafter Patient taking differently: Inject 1,000 mcg into the muscle every 30 (thirty) days. On the 12th day 09/25/18   [name], MD  diazepam (VALIUM) 5 MG tablet Take 1 tablet (5 mg total) by mouth at bedtime as needed for anxiety. 11/27/19   [name], MD  dicyclomine (BENTYL) 10 MG capsule Take 1 capsule (10 mg total) by mouth 4 (four) times daily -  before meals and at bedtime. 07/29/19   [name], MD  diphenhydrAMINE (BENADRYL) 25 MG tablet Take 50 mg by mouth daily as needed for allergies.    [provider]  diphenoxylate-atropine (LOMOTIL) 2.5-0.025 MG tablet Take 1 tablet by mouth 4 (four) times daily as needed for diarrhea or loose stools. 11/27/19   [name], MD  Eszopiclone 3 MG TABS Take 1 tablet (3 mg total) by mouth at bedtime. 11/27/19   [name], MD  Insulin Glargine (LANTUS SOLOSTAR) 100 UNIT/ML Solostar Pen Inject 11 Units into the skin daily. Patient taking differently: Inject 10-13 Units into the skin daily.  03/31/19   [name], MD  Insulin Pen Needle 31G X 5 MM MISC Use to inject insulin daily 12/30/19   [name], MD  ipratropium (ATROVENT) 0.06 % nasal spray Place 2 sprays into both nostrils 4 (four) times daily. 08/21/19   [name], MD  Lancet Devices (ONE TOUCH DELICA LANCING DEV) MISC Use to test blood glucose once daily. Dx code = E11.9 12/16/17   [name], MD  levETIRAcetam (KEPPRA) 750 MG tablet Take 1 tablet (750 mg total) by mouth at bedtime. Patient taking differently: Take 750 mg by mouth at bedtime as  needed (restless legs).  08/08/18   Kathrynn Ducking, MD  NONFORMULARY OR COMPOUNDED ITEM [facility]  Combination Pain Cream -  Baclofen 2%, Doxepin 5%, Gabapentin 6%, Topiramate 2%, Pentoxifylline 3% Apply 1-2 grams to affected area 3-4 times daily Qty. 120 gm 3 refills 10/14/17   Gardiner Barefoot, DPM  nystatin [facility]) powder Apply 1 g topically 4 (four) times daily as needed (rash).     [provider]  ondansetron (ZOFRAN ODT) 4 MG disintegrating tablet Take 1 tablet (4 mg total) by mouth every 8 (eight) hours as needed for nausea or vomiting. 08/07/19   [name], MD  OVER THE COUNTER MEDICATION Apply 1 application topically daily as needed (pain). [place]    [provider]  pregabalin (LYRICA) 100 MG capsule Take 100 mg by mouth at bedtime as needed (leg cramps).     [provider]  Syringe/Needle, Disp, (SYRINGE 3CC/25GX1") 25G X 1" 3 ML MISC Use for b12 injections 09/23/17   [name], MD  traMADol (ULTRAM) 50 MG tablet Take 0.5 tablets (25 mg total) by mouth every 12 (twelve) hours as needed for severe pain. 08/21/17   Regalado, Belkys A, MD  umeclidinium-vilanterol (ANORO ELLIPTA) 62.5-25 MCG/INH AEPB Inhale 1 puff into the lungs daily. 02/24/19   Deneise Lever, MD    Allergies Biaxin [clarithromycin], Demeclocycline, Erythromycin, Flagyl [metronidazole], Glucophage [metformin hcl], Tetracyclines & related, Diovan [valsartan], Sulfa antibiotics, and Xanax [alprazolam]  Family History  Problem Relation Age of Onset  .  Heart disease Father   . Hypertension Father   . Prostate cancer Father   . Stroke Father   . Osteoporosis Father   . Stroke Mother   . Depression Mother   . Headache Mother   . Heart disease Mother   . Thyroid disease Mother   . Hypertension Mother   . Diabetes Daughter   . Heart disease Daughter   . Hypertension Daughter   . Hypertension Son     Social History Social History   Tobacco Use  . Smoking status: Never Smoker  . Smokeless tobacco: Never Used  Vaping Use  . Vaping Use: Never used  Substance Use Topics  . Alcohol use: No  . Drug use: No    Review of Systems  Constitutional: No fever/chills Eyes: No visual changes. ENT: No sore throat. Cardiovascular: Positive for chest pain. Respiratory: Positive for shortness of breath. Gastrointestinal: No abdominal pain.  No nausea, no vomiting.  No diarrhea.  No constipation. Genitourinary: Negative for dysuria. Musculoskeletal: Positive for BLE swelling.  Negative for back pain. Skin: Negative for rash. Neurological: Negative for headaches, focal weakness or numbness.   ____________________________________________   PHYSICAL EXAM:  VITAL SIGNS: ED Triage Vitals  Enc Vitals Group     BP 12/30/19 1504 (!) 125/99     Pulse Rate 12/30/19 1504 72     Resp 12/30/19 1504 19     Temp 12/30/19 1504 99.3 F (37.4 C)     Temp Source 12/30/19 1504 Oral     SpO2 12/30/19 1504 100 %     Weight 12/30/19 1521 249 lb (112.9 kg)     Height 12/30/19 1521 '5\' 4"'  (1.626 m)     Head Circumference --      Peak Flow --      Pain Score 12/30/19 1521 0     Pain Loc --      Pain Edu? --      Excl. in [place]? --  Constitutional: Alert and oriented. Well appearing and in no acute distress. Eyes: Conjunctivae are normal. PERRL. EOMI. Head: Atraumatic. Nose: No congestion/rhinnorhea. Mouth/Throat: Mucous membranes are moist.   Neck: No stridor.   Cardiovascular: Normal rate, regular rhythm. Grossly normal heart sounds.  Good  peripheral circulation. Respiratory: Normal respiratory effort.  No retractions. Lungs CTAB.  Mild tenderness to left anterior chest. Gastrointestinal: Obese. Soft and nontender. No distention. No abdominal bruits. No CVA tenderness. Musculoskeletal: No lower extremity tenderness.  2+ BLE nonpitting edema.  No joint effusions. Neurologic:  Normal speech and language. No gross focal neurologic deficits are appreciated.  Skin:  Skin is warm, dry and intact. No rash noted. Psychiatric: Mood and affect are normal. Speech and behavior are normal.  ____________________________________________   LABS (all labs ordered are listed, but only abnormal results are displayed)  Labs Reviewed  BASIC METABOLIC PANEL - Abnormal; Notable for the following components:      Result Value   Glucose, Bld 157 (*)    Creatinine, Ser 1.06 (*)    Calcium 8.7 (*)    GFR calc non Af Amer 52 (*)    GFR calc Af Amer 60 (*)    All other components within normal limits  CBC - Abnormal; Notable for the following components:   Hemoglobin 11.3 (*)    HCT 34.9 (*)    MCV 79.5 (*)    MCH 25.7 (*)    RDW 17.2 (*)    All other components within normal limits  SARS CORONAVIRUS 2 BY RT PCR (HOSPITAL ORDER, [place] [facility])  GLUCOSE, CAPILLARY  TROPONIN I (HIGH SENSITIVITY)  TROPONIN I (HIGH SENSITIVITY)   ____________________________________________  EKG  ED ECG REPORT I, Marijean Montanye J, the attending physician, personally viewed and interpreted this ECG.   Date: 12/30/2019  EKG Time: 1507  Rate: 72  Rhythm: normal EKG, normal sinus rhythm  Axis: Normal  Intervals:none  ST&T Change: Nonspecific  ____________________________________________  RADIOLOGY  ED MD interpretation: No acute cardiopulmonary process  Official radiology report(s): DG Chest 2 View  Result Date: 12/30/2019 CLINICAL DATA:  Two weeks chest pain EXAM: CHEST - 2 VIEW COMPARISON:  CT 11/11/2019, radiograph 10/15/2018  FINDINGS: Some streaky atelectatic changes, bandlike area opacity in the lingula likely reflects an area of scarring seen on prior studies. No consolidation, features of edema, pneumothorax, or effusion. The cardiomediastinal contours are unremarkable. Prior reverse right shoulder arthroplasty. Mild to moderate degenerative changes in the left shoulder and spine. Cholecystectomy clips in the right upper quadrant. IMPRESSION: Mild bibasilar atelectasis with likely scarring in the lingula. No other acute cardiopulmonary disease. Electronically Signed   By: Lovena Le M.D.   On: 12/30/2019 16:21   ECHOCARDIOGRAM COMPLETE  Result Date: 12/30/2019    ECHOCARDIOGRAM REPORT   Patient Name:   Denise Sharp Date of Exam: 12/30/2019 Medical Rec #:  [id number]         Height:       64.5 in Accession #:    [id number]        Weight:       253.0 lb Date of Birth:  1945/09/02         BSA:          2.173 m Patient Age:    4 years          BP:           138/82 mmHg Patient Gender: F  HR:           74 bpm. Exam Location:  [place] Procedure: 2D Echo, Cardiac Doppler, Color Doppler and Intracardiac            Opacification Agent Indications:    R06.02 SOB; R55 Syncope; R07.9* Chest pain, unspecified  History:        Patient has prior history of Echocardiogram examinations, most                 recent 08/21/2017. CAD, Signs/Symptoms:Shortness of Breath,                 Syncope, Chest Pain and Edema; Risk Factors:Sleep Apnea,                 Hypertension, Diabetes and Non-Smoker.  Sonographer:    Pilar Jarvis RDMS, RVT, RDCS Referring Phys: [phone] CHRISTOPHER END  Sonographer Comments: Following the echo, the patient was taken directly to the ED due to intermittent chest pain over the last 2-3 weeks IMPRESSIONS  1. Left ventricular ejection fraction, by estimation, is 65 to 70%. The left ventricle has normal function. The left ventricle has no regional wall motion abnormalities. Left ventricular diastolic parameters  are consistent with Grade II diastolic dysfunction (pseudonormalization).  2. Right ventricular systolic function is normal. The right ventricular size is normal.  3. The mitral valve is normal in structure. No evidence of mitral valve regurgitation. No evidence of mitral stenosis.  4. The aortic valve is normal in structure. Aortic valve regurgitation is not visualized. No aortic stenosis is present. FINDINGS  Left Ventricle: Left ventricular ejection fraction, by estimation, is 65 to 70%. The left ventricle has normal function. The left ventricle has no regional wall motion abnormalities. Definity contrast agent was given IV to delineate the left ventricular  endocardial borders. The left ventricular internal cavity size was normal in size. There is no left ventricular hypertrophy. Left ventricular diastolic parameters are consistent with Grade II diastolic dysfunction (pseudonormalization). Right Ventricle: The right ventricular size is normal. No increase in right ventricular wall thickness. Right ventricular systolic function is normal. Left Atrium: Left atrial size was normal in size. Right Atrium: Right atrial size was normal in size. Pericardium: There is no evidence of pericardial effusion. Mitral Valve: The mitral valve is normal in structure. Normal mobility of the mitral valve leaflets. No evidence of mitral valve regurgitation. No evidence of mitral valve stenosis. MV peak gradient, 6.5 mmHg. The mean mitral valve gradient is 2.5 mmHg. Tricuspid Valve: The tricuspid valve is normal in structure. Tricuspid valve regurgitation is not demonstrated. No evidence of tricuspid stenosis. Aortic Valve: The aortic valve is normal in structure. Aortic valve regurgitation is not visualized. No aortic stenosis is present. Aortic valve mean gradient measures 4.0 mmHg. Aortic valve peak gradient measures 9.1 mmHg. Aortic valve area, by VTI measures 2.22 cm. Pulmonic Valve: The pulmonic valve was normal in structure.  Pulmonic valve regurgitation is trivial. No evidence of pulmonic stenosis. Aorta: The aortic root is normal in size and structure. Venous: The inferior vena cava was not well visualized. IAS/Shunts: No atrial level shunt detected by color flow Doppler.  LEFT VENTRICLE PLAX 2D LVIDd:         4.50 cm  Diastology LVIDs:         2.40 cm  LV e' lateral:   4.65 cm/s LV PW:         1.20 cm  LV E/e' lateral: 28.0 LV IVS:  1.00 cm  LV e' medial:    6.38 cm/s LVOT diam:     1.80 cm  LV E/e' medial:  20.4 LV SV:         66 LV SV Index:   30 LVOT Area:     2.54 cm  RIGHT VENTRICLE RV S prime:     14.00 cm/s TAPSE (M-mode): 2.6 cm LEFT ATRIUM             Index LA diam:        4.10 cm 1.89 cm/m LA Vol (A2C):   61.5 ml 28.30 ml/m LA Vol (A4C):   38.0 ml 17.48 ml/m LA Biplane Vol: 48.6 ml 22.36 ml/m  AORTIC VALVE                   PULMONIC VALVE AV Area (Vmax):    1.84 cm    PV Vmax:       0.84 m/s AV Area (Vmean):   2.33 cm    PV Peak grad:  2.8 mmHg AV Area (VTI):     2.22 cm AV Vmax:           151.00 cm/s AV Vmean:          84.300 cm/s AV VTI:            0.298 m AV Peak Grad:      9.1 mmHg AV Mean Grad:      4.0 mmHg LVOT Vmax:         109.00 cm/s LVOT Vmean:        77.200 cm/s LVOT VTI:          0.260 m LVOT/AV VTI ratio: 0.87  AORTA Ao Root diam: 2.90 cm Ao Asc diam:  3.40 cm Ao Arch diam: 2.9 cm MITRAL VALVE MV Area (PHT): 2.96 cm     SHUNTS MV Peak grad:  6.5 mmHg     Systemic VTI:  0.26 m MV Mean grad:  2.5 mmHg     Systemic Diam: 1.80 cm MV Vmax:       1.27 m/s MV Vmean:      69.5 cm/s MV Decel Time: 256 msec MV E velocity: 130.00 cm/s MV A velocity: 84.80 cm/s MV E/A ratio:  1.53 Kate Sable MD Electronically signed by Kate Sable MD Signature Date/Time: 12/30/2019/3:27:40 PM    Final     ____________________________________________   PROCEDURES  Procedure(s) performed (including Critical Care):  .1-3 Lead EKG Interpretation Performed by: Paulette Blanch, MD Authorized by: Paulette Blanch,  MD     Interpretation: normal     ECG rate:  71   ECG rate assessment: normal     Rhythm: sinus rhythm     Ectopy: none     Conduction: normal   Comments:     Patient placed on cardiac monitor to monitor for arrhythmias     ____________________________________________   INITIAL IMPRESSION / ASSESSMENT AND PLAN / ED COURSE  As part of my medical decision making, I reviewed the following data within the [place] notes reviewed and incorporated, Labs reviewed, EKG interpreted, Old chart reviewed, Radiograph reviewed, Discussed with admitting physician and Notes from prior ED visits     [name] was evaluated in Emergency Department on 12/30/2019 for the symptoms described in the history of present illness. She was evaluated in the context of the global COVID-19 pandemic, which necessitated consideration that the patient might be at risk for infection with the  SARS-CoV-2 virus that causes COVID-19. Institutional protocols and algorithms that pertain to the evaluation of patients at risk for COVID-19 are in a state of rapid change based on information released by regulatory bodies including the CDC and federal and state organizations. These policies and algorithms were followed during the patient's care in the ED.    74 year old female sent to the ED by her cardiologist for admission with probable cardiac cath. Differential diagnosis includes, but is not limited to, ACS, aortic dissection, pulmonary embolism, cardiac tamponade, pneumothorax, pneumonia, pericarditis, myocarditis, GI-related causes including esophagitis/gastritis, and musculoskeletal chest wall pain.    Laboratory, EKG and imaging results unremarkable.  Will administer aspirin, nitroglycerin paste.  Will discuss with hospitalist services for admission.      ____________________________________________   FINAL CLINICAL IMPRESSION(S) / ED DIAGNOSES  Final diagnoses:  Chest pain,  unspecified type  Unstable angina [facility])     ED Discharge Orders    None       Note:  This document was prepared using Dragon voice recognition software and may include unintentional dictation errors.   Paulette Blanch, MD 12/31/19 [phone]

## 2019-12-30 NOTE — ED Notes (Signed)
Pt resting comfortably pending admission, denies any pain or discomfort at this time.

## 2019-12-30 NOTE — Telephone Encounter (Signed)
Patient presented today for echocardiogram and complained of worsening chest pain over the last 2 weeks.  It is now frequently present at rest as well.  She also notes considerable exertional dyspnea that has worsened since our last visit.  She currently notes some vague soreness in her chest but not the more significant tightness that she has experienced over the last 2 weeks.  She was also out of town this past weekend and developed significant swelling in her legs (left greater than right.  Her husband is concerned that Denise Sharp may also have briefly passed out this weekend while out of town.  EKG done in the office today shows normal sinus rhythm without significant abnormalities.  Echocardiogram is currently being performed.  However,, in light of her progressing symptoms concerning for unstable angina, I have recommended that she be transported to the emergency department for further evaluation and likely admission with cardiac catheterization prior to discharge.  Denise Sharp is in agreement with this plan.

## 2019-12-31 ENCOUNTER — Encounter
Admission: EM | Disposition: A | Discharge status: Home Health | Payer: Self-pay | Source: Home / Self Care | Attending: Internal Medicine

## 2019-12-31 ENCOUNTER — Encounter: Payer: Self-pay | Admitting: Internal Medicine

## 2019-12-31 DIAGNOSIS — G4733 Obstructive sleep apnea (adult) (pediatric): Secondary | ICD-10-CM

## 2019-12-31 DIAGNOSIS — Z20822 Contact with and (suspected) exposure to covid-19: Secondary | ICD-10-CM | POA: Diagnosis present

## 2019-12-31 DIAGNOSIS — I10 Essential (primary) hypertension: Secondary | ICD-10-CM | POA: Diagnosis not present

## 2019-12-31 DIAGNOSIS — R627 Adult failure to thrive: Secondary | ICD-10-CM | POA: Diagnosis present

## 2019-12-31 DIAGNOSIS — E118 Type 2 diabetes mellitus with unspecified complications: Secondary | ICD-10-CM

## 2019-12-31 DIAGNOSIS — Z9884 Bariatric surgery status: Secondary | ICD-10-CM | POA: Diagnosis not present

## 2019-12-31 DIAGNOSIS — I200 Unstable angina: Secondary | ICD-10-CM | POA: Diagnosis present

## 2019-12-31 DIAGNOSIS — Z6841 Body Mass Index (BMI) 40.0 and over, adult: Secondary | ICD-10-CM | POA: Diagnosis not present

## 2019-12-31 DIAGNOSIS — K219 Gastro-esophageal reflux disease without esophagitis: Secondary | ICD-10-CM | POA: Diagnosis present

## 2019-12-31 DIAGNOSIS — Z9989 Dependence on other enabling machines and devices: Secondary | ICD-10-CM

## 2019-12-31 DIAGNOSIS — R159 Full incontinence of feces: Secondary | ICD-10-CM | POA: Diagnosis present

## 2019-12-31 DIAGNOSIS — Z8249 Family history of ischemic heart disease and other diseases of the circulatory system: Secondary | ICD-10-CM | POA: Diagnosis not present

## 2019-12-31 DIAGNOSIS — F329 Major depressive disorder, single episode, unspecified: Secondary | ICD-10-CM | POA: Diagnosis present

## 2019-12-31 DIAGNOSIS — G2581 Restless legs syndrome: Secondary | ICD-10-CM | POA: Diagnosis present

## 2019-12-31 DIAGNOSIS — K589 Irritable bowel syndrome without diarrhea: Secondary | ICD-10-CM | POA: Diagnosis present

## 2019-12-31 DIAGNOSIS — F411 Generalized anxiety disorder: Secondary | ICD-10-CM | POA: Diagnosis present

## 2019-12-31 DIAGNOSIS — R1310 Dysphagia, unspecified: Secondary | ICD-10-CM | POA: Diagnosis present

## 2019-12-31 DIAGNOSIS — G4700 Insomnia, unspecified: Secondary | ICD-10-CM | POA: Diagnosis present

## 2019-12-31 DIAGNOSIS — E1159 Type 2 diabetes mellitus with other circulatory complications: Secondary | ICD-10-CM | POA: Diagnosis not present

## 2019-12-31 DIAGNOSIS — M4802 Spinal stenosis, cervical region: Secondary | ICD-10-CM | POA: Diagnosis present

## 2019-12-31 DIAGNOSIS — I25110 Atherosclerotic heart disease of native coronary artery with unstable angina pectoris: Principal | ICD-10-CM

## 2019-12-31 DIAGNOSIS — H9191 Unspecified hearing loss, right ear: Secondary | ICD-10-CM | POA: Diagnosis present

## 2019-12-31 DIAGNOSIS — E785 Hyperlipidemia, unspecified: Secondary | ICD-10-CM | POA: Diagnosis present

## 2019-12-31 DIAGNOSIS — I951 Orthostatic hypotension: Secondary | ICD-10-CM | POA: Diagnosis present

## 2019-12-31 DIAGNOSIS — I959 Hypotension, unspecified: Secondary | ICD-10-CM | POA: Diagnosis not present

## 2019-12-31 DIAGNOSIS — I208 Other forms of angina pectoris: Secondary | ICD-10-CM

## 2019-12-31 DIAGNOSIS — Z96611 Presence of right artificial shoulder joint: Secondary | ICD-10-CM | POA: Diagnosis present

## 2019-12-31 DIAGNOSIS — Z96653 Presence of artificial knee joint, bilateral: Secondary | ICD-10-CM | POA: Diagnosis present

## 2019-12-31 DIAGNOSIS — E1142 Type 2 diabetes mellitus with diabetic polyneuropathy: Secondary | ICD-10-CM | POA: Diagnosis present

## 2019-12-31 HISTORY — PX: LEFT HEART CATH AND CORS/GRAFTS ANGIOGRAPHY: CATH118250

## 2019-12-31 LAB — APTT: aPTT: 32 seconds (ref 24–36)

## 2019-12-31 LAB — LIPID PANEL
Cholesterol: 183 mg/dL (ref 0–200)
HDL: 42 mg/dL (ref >40)
LDL Cholesterol: 113 mg/dL — ABNORMAL HIGH (ref 0–99)
Total CHOL/HDL Ratio: 4.4 RATIO
Triglycerides: 140 mg/dL (ref <150)
VLDL: 28 mg/dL (ref 0–40)

## 2019-12-31 LAB — GLUCOSE, CAPILLARY
Glucose-Capillary: 114 mg/dL — ABNORMAL HIGH (ref 70–99)
Glucose-Capillary: 166 mg/dL — ABNORMAL HIGH (ref 70–99)
Glucose-Capillary: 186 mg/dL — ABNORMAL HIGH (ref 70–99)
Glucose-Capillary: 164 mg/dL — ABNORMAL HIGH (ref 70–99)
Glucose-Capillary: 184 mg/dL — ABNORMAL HIGH (ref 70–99)

## 2019-12-31 LAB — SARS CORONAVIRUS 2 BY RT PCR (HOSPITAL ORDER, PERFORMED IN ~~LOC~~ HOSPITAL LAB): SARS Coronavirus 2: NEGATIVE

## 2019-12-31 LAB — PROTIME-INR
INR: 1.1 (ref 0.8–1.2)
Prothrombin Time: 13.4 seconds (ref 11.4–15.2)

## 2019-12-31 LAB — TROPONIN I (HIGH SENSITIVITY): Troponin I (High Sensitivity): 8 ng/L (ref <18)

## 2019-12-31 SURGERY — LEFT HEART CATH AND CORS/GRAFTS ANGIOGRAPHY
Anesthesia: Moderate Sedation

## 2019-12-31 MED ORDER — SODIUM CHLORIDE 0.9% FLUSH
3.0000 mL | Freq: Two times a day (BID) | INTRAVENOUS | Status: DC
  Administered 2019-12-31 – 2020-01-03 (×6): 3 mL via INTRAVENOUS

## 2019-12-31 MED ORDER — LIDOCAINE HCL (PF) 1 % IJ SOLN
INTRAMUSCULAR | Status: AC
  Filled 2019-12-31: qty 30

## 2019-12-31 MED ORDER — ACETAMINOPHEN 325 MG PO TABS: 650.0000 mg | ORAL_TABLET | ORAL | Status: DC | PRN

## 2019-12-31 MED ORDER — HEPARIN BOLUS VIA INFUSION
4000.0000 [IU] | Freq: Once | INTRAVENOUS | Status: AC
  Administered 2019-12-31: 4000 [IU] via INTRAVENOUS
  Filled 2019-12-31: qty 4000

## 2019-12-31 MED ORDER — FAMOTIDINE 20 MG PO TABS
20.0000 mg | ORAL_TABLET | Freq: Every day | ORAL | Status: DC
  Administered 2019-12-31 – 2020-01-05 (×6): 20 mg via ORAL
  Filled 2019-12-31 (×6): qty 1

## 2019-12-31 MED ORDER — ENOXAPARIN SODIUM 40 MG/0.4ML ~~LOC~~ SOLN
40.0000 mg | SUBCUTANEOUS | Status: DC
  Administered 2019-12-31 – 2020-01-03 (×4): 40 mg via SUBCUTANEOUS
  Filled 2019-12-31 (×4): qty 0.4

## 2019-12-31 MED ORDER — SODIUM CHLORIDE 0.9% FLUSH: 3.0000 mL | INTRAVENOUS | Status: DC | PRN

## 2019-12-31 MED ORDER — FENTANYL CITRATE (PF) 100 MCG/2ML IJ SOLN
INTRAMUSCULAR | Status: AC
  Filled 2019-12-31: qty 2

## 2019-12-31 MED ORDER — MIDAZOLAM HCL 2 MG/2ML IJ SOLN
INTRAMUSCULAR | Status: DC | PRN
  Administered 2019-12-31 (×2): 1 mg via INTRAVENOUS

## 2019-12-31 MED ORDER — ONDANSETRON HCL 4 MG/2ML IJ SOLN
4.0000 mg | Freq: Four times a day (QID) | INTRAMUSCULAR | Status: DC | PRN
  Administered 2019-12-31: 4 mg via INTRAVENOUS

## 2019-12-31 MED ORDER — ATORVASTATIN CALCIUM 20 MG PO TABS
40.0000 mg | ORAL_TABLET | Freq: Every day | ORAL | Status: DC
  Administered 2019-12-31 – 2020-01-05 (×6): 40 mg via ORAL
  Filled 2019-12-31 (×6): qty 2

## 2019-12-31 MED ORDER — ALBUTEROL SULFATE (2.5 MG/3ML) 0.083% IN NEBU: 2.5000 mg | INHALATION_SOLUTION | Freq: Three times a day (TID) | RESPIRATORY_TRACT | Status: DC | PRN

## 2019-12-31 MED ORDER — DICYCLOMINE HCL 10 MG PO CAPS
10.0000 mg | ORAL_CAPSULE | Freq: Three times a day (TID) | ORAL | Status: DC
  Administered 2019-12-31 – 2020-01-05 (×19): 10 mg via ORAL
  Filled 2019-12-31 (×23): qty 1

## 2019-12-31 MED ORDER — METOPROLOL TARTRATE 25 MG PO TABS
25.0000 mg | ORAL_TABLET | Freq: Two times a day (BID) | ORAL | Status: DC
  Administered 2019-12-31 – 2020-01-04 (×5): 25 mg via ORAL
  Filled 2019-12-31 (×8): qty 1

## 2019-12-31 MED ORDER — LEVETIRACETAM 750 MG PO TABS
750.0000 mg | ORAL_TABLET | Freq: Every evening | ORAL | Status: DC | PRN
  Filled 2019-12-31: qty 1

## 2019-12-31 MED ORDER — LABETALOL HCL 5 MG/ML IV SOLN: 10.0000 mg | INTRAVENOUS | Status: AC | PRN

## 2019-12-31 MED ORDER — ALUM & MAG HYDROXIDE-SIMETH 200-200-20 MG/5ML PO SUSP: 30.0000 mL | Freq: Four times a day (QID) | ORAL | Status: DC | PRN

## 2019-12-31 MED ORDER — HEPARIN (PORCINE) IN NACL 1000-0.9 UT/500ML-% IV SOLN
INTRAVENOUS | Status: DC | PRN
  Administered 2019-12-31: 500 mL

## 2019-12-31 MED ORDER — ISOSORBIDE MONONITRATE ER 30 MG PO TB24
30.0000 mg | ORAL_TABLET | Freq: Every day | ORAL | Status: DC
  Administered 2019-12-31 – 2020-01-01 (×2): 30 mg via ORAL
  Filled 2019-12-31 (×2): qty 1

## 2019-12-31 MED ORDER — SODIUM CHLORIDE 0.9 % IV SOLN: INTRAVENOUS | Status: DC

## 2019-12-31 MED ORDER — BUPROPION HCL ER (XL) 150 MG PO TB24
150.0000 mg | ORAL_TABLET | Freq: Every morning | ORAL | Status: DC
  Administered 2019-12-31 – 2020-01-05 (×6): 150 mg via ORAL
  Filled 2019-12-31 (×6): qty 1

## 2019-12-31 MED ORDER — LIDOCAINE HCL (PF) 1 % IJ SOLN
INTRAMUSCULAR | Status: DC | PRN
  Administered 2019-12-31: 30 mL via SUBCUTANEOUS
  Administered 2019-12-31: 10 mL via SUBCUTANEOUS
  Administered 2019-12-31: 20 mL via SUBCUTANEOUS

## 2019-12-31 MED ORDER — INSULIN ASPART 100 UNIT/ML ~~LOC~~ SOLN
0.0000 [IU] | Freq: Every day | SUBCUTANEOUS | Status: DC
  Administered 2020-01-01 – 2020-01-02 (×2): 2 [IU] via SUBCUTANEOUS
  Filled 2019-12-31 (×2): qty 1

## 2019-12-31 MED ORDER — SODIUM CHLORIDE 0.9 % IV SOLN: 250.0000 mL | INTRAVENOUS | Status: DC | PRN

## 2019-12-31 MED ORDER — ACETAMINOPHEN 325 MG PO TABS
650.0000 mg | ORAL_TABLET | ORAL | Status: DC | PRN
  Administered 2020-01-01: 650 mg via ORAL
  Filled 2019-12-31 (×2): qty 2

## 2019-12-31 MED ORDER — BUPROPION HCL 75 MG PO TABS
75.0000 mg | ORAL_TABLET | Freq: Every day | ORAL | Status: DC
  Administered 2019-12-31 – 2020-01-04 (×5): 75 mg via ORAL
  Filled 2019-12-31 (×6): qty 1

## 2019-12-31 MED ORDER — PANTOPRAZOLE SODIUM 40 MG PO TBEC
40.0000 mg | DELAYED_RELEASE_TABLET | Freq: Every day | ORAL | Status: DC
  Administered 2019-12-31 – 2020-01-05 (×7): 40 mg via ORAL
  Filled 2019-12-31 (×7): qty 1

## 2019-12-31 MED ORDER — SODIUM CHLORIDE 0.9 % WEIGHT BASED INFUSION
1.0000 mL/kg/h | INTRAVENOUS | Status: AC
  Administered 2019-12-31: 1 mL/kg/h via INTRAVENOUS

## 2019-12-31 MED ORDER — HYDRALAZINE HCL 20 MG/ML IJ SOLN: 10.0000 mg | INTRAMUSCULAR | Status: AC | PRN

## 2019-12-31 MED ORDER — ALBUTEROL SULFATE HFA 108 (90 BASE) MCG/ACT IN AERS: 2.0000 | INHALATION_SPRAY | Freq: Three times a day (TID) | RESPIRATORY_TRACT | Status: DC | PRN

## 2019-12-31 MED ORDER — INSULIN ASPART 100 UNIT/ML ~~LOC~~ SOLN
0.0000 [IU] | Freq: Three times a day (TID) | SUBCUTANEOUS | Status: DC
  Administered 2019-12-31: 3 [IU] via SUBCUTANEOUS
  Administered 2019-12-31: 4 [IU] via SUBCUTANEOUS
  Administered 2020-01-01 (×2): 3 [IU] via SUBCUTANEOUS
  Administered 2020-01-01: 7 [IU] via SUBCUTANEOUS
  Administered 2020-01-02: 5 [IU] via SUBCUTANEOUS
  Administered 2020-01-03: 4 [IU] via SUBCUTANEOUS
  Administered 2020-01-03 – 2020-01-04 (×2): 7 [IU] via SUBCUTANEOUS
  Administered 2020-01-05: 3 [IU] via SUBCUTANEOUS
  Administered 2020-01-05: 4 [IU] via SUBCUTANEOUS
  Filled 2019-12-31 (×11): qty 1

## 2019-12-31 MED ORDER — SODIUM CHLORIDE 0.9% FLUSH
3.0000 mL | Freq: Two times a day (BID) | INTRAVENOUS | Status: DC
  Administered 2019-12-31 – 2020-01-03 (×7): 3 mL via INTRAVENOUS

## 2019-12-31 MED ORDER — MIDAZOLAM HCL 2 MG/2ML IJ SOLN
INTRAMUSCULAR | Status: AC
  Filled 2019-12-31: qty 2

## 2019-12-31 MED ORDER — UMECLIDINIUM-VILANTEROL 62.5-25 MCG/INH IN AEPB
1.0000 | INHALATION_SPRAY | Freq: Every day | RESPIRATORY_TRACT | Status: DC
  Administered 2019-12-31 – 2020-01-05 (×5): 1 via RESPIRATORY_TRACT
  Filled 2019-12-31: qty 14

## 2019-12-31 MED ORDER — IOHEXOL 300 MG/ML  SOLN
INTRAMUSCULAR | Status: DC | PRN
  Administered 2019-12-31: 95 mL

## 2019-12-31 MED ORDER — ONDANSETRON HCL 4 MG/2ML IJ SOLN
4.0000 mg | Freq: Four times a day (QID) | INTRAMUSCULAR | Status: DC | PRN
  Administered 2020-01-01: 4 mg via INTRAVENOUS
  Filled 2019-12-31 (×2): qty 2

## 2019-12-31 MED ORDER — NITROGLYCERIN 0.4 MG SL SUBL: 0.4000 mg | SUBLINGUAL_TABLET | SUBLINGUAL | Status: DC | PRN

## 2019-12-31 MED ORDER — HEPARIN (PORCINE) 25000 UT/250ML-% IV SOLN
1000.0000 [IU]/h | INTRAVENOUS | Status: DC
  Administered 2019-12-31: 1000 [IU]/h via INTRAVENOUS
  Filled 2019-12-31: qty 250

## 2019-12-31 MED ORDER — DIAZEPAM 5 MG PO TABS
5.0000 mg | ORAL_TABLET | Freq: Every evening | ORAL | Status: DC | PRN
  Administered 2019-12-31 – 2020-01-04 (×4): 5 mg via ORAL
  Filled 2019-12-31 (×4): qty 1

## 2019-12-31 MED ORDER — FENTANYL CITRATE (PF) 100 MCG/2ML IJ SOLN
INTRAMUSCULAR | Status: DC | PRN
  Administered 2019-12-31 (×3): 25 ug via INTRAVENOUS

## 2019-12-31 MED ORDER — HEPARIN (PORCINE) IN NACL 1000-0.9 UT/500ML-% IV SOLN
INTRAVENOUS | Status: AC
  Filled 2019-12-31: qty 1000

## 2019-12-31 MED ORDER — TRAMADOL HCL 50 MG PO TABS
25.0000 mg | ORAL_TABLET | Freq: Two times a day (BID) | ORAL | Status: DC | PRN
  Administered 2019-12-31: 25 mg via ORAL
  Filled 2019-12-31 (×2): qty 1

## 2019-12-31 MED ORDER — ZOLPIDEM TARTRATE 5 MG PO TABS
5.0000 mg | ORAL_TABLET | Freq: Every evening | ORAL | Status: DC | PRN
  Administered 2019-12-31 – 2020-01-04 (×5): 5 mg via ORAL
  Filled 2019-12-31 (×6): qty 1

## 2019-12-31 MED ORDER — ASPIRIN EC 81 MG PO TBEC
81.0000 mg | DELAYED_RELEASE_TABLET | Freq: Every day | ORAL | Status: DC
  Administered 2019-12-31 – 2020-01-05 (×5): 81 mg via ORAL
  Filled 2019-12-31 (×5): qty 1

## 2019-12-31 MED ORDER — INSULIN GLARGINE 100 UNIT/ML ~~LOC~~ SOLN
11.0000 [IU] | Freq: Every day | SUBCUTANEOUS | Status: DC
  Administered 2019-12-31 – 2020-01-05 (×5): 11 [IU] via SUBCUTANEOUS
  Filled 2019-12-31 (×7): qty 0.11

## 2019-12-31 MED ORDER — PREGABALIN 50 MG PO CAPS: 100.0000 mg | ORAL_CAPSULE | Freq: Every evening | ORAL | Status: DC | PRN

## 2019-12-31 SURGICAL SUPPLY — 13 items
CANNULA 5F STIFF (CANNULA) ×2 IMPLANT
CATH INFINITI 5FR ANG PIGTAIL (CATHETERS) ×1 IMPLANT
CATH INFINITI 5FR JL4 (CATHETERS) ×1 IMPLANT
CATH INFINITI JR4 5F (CATHETERS) ×1 IMPLANT
DEVICE CLOSURE MYNXGRIP 5F (Vascular Products) ×1 IMPLANT
KIT MANI 3VAL PERCEP (MISCELLANEOUS) ×2 IMPLANT
NDL PERC 18GX7CM (NEEDLE) IMPLANT
NDL SMART REG 18GX2-3/4 (NEEDLE) IMPLANT
NEEDLE PERC 18GX7CM (NEEDLE) ×2 IMPLANT
NEEDLE SMART REG 18GX2-3/4 (NEEDLE) ×2 IMPLANT
PACK CARDIAC CATH (CUSTOM PROCEDURE TRAY) ×2 IMPLANT
SHEATH AVANTI 5FR X 11CM (SHEATH) ×2 IMPLANT
WIRE GUIDERIGHT .035X150 (WIRE) ×2 IMPLANT

## 2019-12-31 NOTE — ED Notes (Signed)
Attempted IV 2X with no success

## 2019-12-31 NOTE — Progress Notes (Signed)
ANTICOAGULATION CONSULT NOTE - Initial Consult  Pharmacy Consult for heparin Indication: chest pain/ACS  Allergies  Allergen Reactions  . Biaxin [Clarithromycin] Nausea And Vomiting and Other (See Comments)    Severe irritable bowel  . Demeclocycline Hives  . Erythromycin Nausea And Vomiting and Other (See Comments)    Severe irritable bowel  . Flagyl [Metronidazole] Nausea And Vomiting and Other (See Comments)    Severe irritable bowel  . Glucophage [Metformin Hcl] Nausea And Vomiting and Other (See Comments)    "Sick" "I won't take anything that has metformin in it"  . Tetracyclines & Related Hives and Rash  . Diovan [Valsartan] Nausea Only       . Sulfa Antibiotics Rash and Other (See Comments)    As child  . Xanax [Alprazolam] Other (See Comments)    Unknown reaction    Patient Measurements: Height: 5\' 4"  (162.6 cm) Weight: 112.9 kg (249 lb) IBW/kg (Calculated) : 54.7 Heparin Dosing Weight: 78 kg  Vital Signs: Temp: 99.1 F (37.3 C) (06/16 1941) Temp Source: Oral (06/16 1941) BP: 114/51 (06/16 2330) Pulse Rate: 78 (06/16 2330)  Labs: Recent Labs    12/30/19 1506 12/30/19 2006  HGB 11.3*  --   HCT 34.9*  --   PLT 366  --   CREATININE 1.06*  --   TROPONINIHS 7 9    Estimated Creatinine Clearance: 57.3 mL/min (A) (by C-G formula based on SCr of 1.06 mg/dL (H)).   Medical History: Past Medical History:  Diagnosis Date  . Abnormality of gait 03/25/2013  . Adrenal mass, left ([place])   . Anemia    iron deficient post  2 unit txfsn 2009, normal endo/colonoscopy by [facility]  . Arthritis   . Atypical chest pain    a. 04/1986 Cath ([facility]): nl cors, EF 65%; b. 07/2012 Cath [facility]): LM nl, LAD/LCX/RCA w/ min irregs; c. 07/2016 MV [facility]): "Equivocal" w/ small inf wall rev defect and hyperdynamic LV contraction, EF 87%; d. 08/2016 Cardiac CT Ca2+ score [facility]): Ca2+ score 1548.  [name] Cervical spinal stenosis 1994   due to trauma to back ([employer] accident), has intermittent  paralysis and parasthesias  . Cervicogenic headache 03/23/2014  . Depression   . Dizziness    chronic dizziness  . DJD (degenerative joint disease)    a. Chronic R shoulder pain pending R shoulder replacement 07/2017.  [name] Esophageal stenosis March 2011   with transietn outlet obstruction by food, cleared by EGD   . Family history of adverse reaction to anesthesia    daughter PONV  . Gastric bypass status for obesity   . Headache(784.0)   . Hypertension   . IBS (irritable bowel syndrome)   . Left bundle branch block (LBBB)    a. Intermittently present - likely rate related. - patient denies  . Obesity   . Obstructive sleep apnea    using CPAP  . 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 Restless leg syndrome   . Rotator cuff arthropathy, right 08/13/2017  . Syncope and collapse 03/12/2014  . Type II diabetes mellitus (HCC)     Medications:  Scheduled:  . aspirin EC  81 mg Oral Daily  . atorvastatin  40 mg Oral Daily  . heparin  4,000 Units Intravenous Once  . insulin aspart  0-20 Units Subcutaneous TID WC  . insulin aspart  0-5 Units Subcutaneous QHS  . metoprolol tartrate  25 mg Oral BID  . sodium chloride flush  3 mL Intravenous Once    Assessment: Patient  admitted for CP w/ h/o morbid obesity, OSA, HTN, DM, CAD s/p cath x 4 last one in 2018  presents w/ CP w/ WNL troponin and EKG x 2. Patient is not on anticoagulation PTA. Baseline CBC WNL, aPTT/INR pending. Patient is being started on heparin drip for UA/ACS.  Goal of Therapy:  Heparin level 0.3-0.7 units/ml Monitor platelets by anticoagulation protocol: Yes   Plan:  Will bolus heparin 4000 units IV x 1 Will start rate at 1000 units/hr. Will check anti-Xa at 1000. Will monitor daily CBC's and adjust per anti-Xa levels.  Tobie Lords, PharmD, BCPS Clinical Pharmacist 12/31/2019,1:30 AM

## 2019-12-31 NOTE — ED Notes (Signed)
Pt resting comfortably with eyes closed, will continue to monitor.  

## 2019-12-31 NOTE — Progress Notes (Signed)
Cardiac catheterization Nonobstructive disease, ostial/proximal LAD 60 to 65% Otherwise no significant disease in other vessels Normal ejection fraction  Discussed with interventional cardiology Medical management recommended We will add Imdur 30 mg daily, aggressive lipid management for goal LDL less than 70  Outpatient follow-up with Dr. Saunders Revel Dysphagia work-up with Dr. Allen Norris as outpatient  Signed, Esmond Plants, MD, Ph.D [facility]

## 2019-12-31 NOTE — Consult Note (Addendum)
Cardiology Consult    Patient ID: [place] MRN: [id number], DOB/AGE: April 20, 1946   Admit date: 12/30/2019 Date of Consult: 12/31/2019  Primary Physician: [name], MD Primary Cardiologist: [name], MD Requesting Provider: Chauncey Cruel. Posey Pronto, MD  Patient Profile    Denise Sharp is a 74 y.o. female with a history of chest pain and nonobs CAD, HTN, HL, DM, diastolic dysfunction, diabetic neuropathy, obesity, sleep apnea, and DJD, who is being seen today for the evaluation of chest pain at the request of Dr. Posey Pronto.  Past Medical History   Past Medical History:  Diagnosis Date  . Abnormality of gait 03/25/2013  . Adrenal mass, left ([place])   . Anemia    iron deficient post  2 unit txfsn 2009, normal endo/colonoscopy by [facility]  . Arthritis   . Atypical chest pain    a. 04/1986 Cath ([facility]): nl cors, EF 65%; b. 07/2012 Cath [facility]): LM nl, LAD/LCX/RCA w/ min irregs; c. 07/2016 MV [facility]): "Equivocal" w/ small inf wall rev defect and hyperdynamic LV contraction, EF 87%; d. 08/2016 Cardiac CT Ca2+ score [facility]): Ca2+ score 1548; e. 05/2019 MV: No ischemia. Diaph atten. EF 75%.  . Cervical spinal stenosis 1994   due to trauma to back ([employer] accident), has intermittent paralysis and parasthesias  . Cervicogenic headache 03/23/2014  . Depression   . Diastolic dysfunction    a. 12/2019 Echo: EF 65-70%, Gr2 DD. No significant valvular dzs.  . Dizziness    chronic dizziness  . DJD (degenerative joint disease)    a. Chronic R shoulder pain pending R shoulder replacement 07/2017.  [name] Esophageal stenosis March 2011   with transietn outlet obstruction by food, cleared by EGD   . Family history of adverse reaction to anesthesia    daughter PONV  . Gastric bypass status for obesity   . Headache(784.0)   . Hypertension   . IBS (irritable bowel syndrome)   . Left bundle branch block (LBBB)    a. Intermittently present - likely rate related. - patient denies  . Obesity   . Obstructive sleep  apnea    using CPAP  . 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 Restless leg syndrome   . Rotator cuff arthropathy, right 08/13/2017  . Syncope and collapse 03/12/2014  . Type II diabetes mellitus ([place])     Past Surgical History:  Procedure Laterality Date  . ABDOMINAL HYSTERECTOMY    . APPENDECTOMY    . COLONOSCOPY    . DIAPHRAGMATIC HERNIA REPAIR  2015  . ESOPHAGEAL DILATION     multiple  . ESOPHAGOGASTRODUODENOSCOPY    . [place]  . GALLBLADDER SURGERY  resection  . GASTRIC BYPASS    . GASTRIC BYPASS  2000, 2005   Dr. Debroah Loop  . [facility] IMPLANT PLACEMENT  April 2013   [place]  . JOINT REPLACEMENT  2007   bilateral knee. [name],  [name]  . PANNICULECTOMY  06/16/2019  . PANNICULECTOMY N/A 06/16/2019   Procedure: PANNICULECTOMY;  Surgeon: [name], MD;  Location: [place];  Service: Plastics;  Laterality: N/A;  3 hours, please  . REVERSE SHOULDER ARTHROPLASTY Right 08/13/2017   Procedure: REVERSE RIGHT SHOULDER ARTHROPLASTY;  Surgeon: [name], MD;  Location: [place];  Service: Orthopedics;  Laterality: Right;  . ROTATOR CUFF REPAIR     right  . [place]  . TOTAL ABDOMINAL HYSTERECTOMY W/ BILATERAL SALPINGOOPHORECTOMY  1974  . UMBILICAL HERNIA REPAIR  Aug 11, 2015  Allergies  Allergies  Allergen Reactions  . Demeclocycline Hives  . Erythromycin Nausea And Vomiting and Other (See Comments)    Severe irritable bowel  . Flagyl [Metronidazole] Nausea And Vomiting and Other (See Comments)    Severe irritable bowel  . Glucophage [Metformin Hcl] Nausea And Vomiting and Other (See Comments)    "Sick" "I won't take anything that has metformin in it"  . Tetracyclines & Related Hives and Rash  . Diovan [Valsartan] Nausea Only       . Sulfa Antibiotics Rash and Other (See Comments)    As child  . Xanax [Alprazolam] Other (See Comments)    Unknown reaction    History of Present Illness    74 year old female with the  above past medical history including a long history of chest pain with multiple evaluations including diagnostic catheterizations in 1987 and again in 2014, revealing minor irregularities, hypertension, hyperlipidemia, diabetes, diastolic dysfunction, diabetic neuropathy, obesity, sleep apnea, and degenerative joint disease.  Most recent ischemic evaluation was in November 2020 in the setting of atypical chest pain and was low risk.  She has been experiencing persistent cough and was evaluated by pulmonology and subsequently referred back to cardiology I have concerned that cough and dyspnea may be of cardiac etiology.  She was seen on May 12 and an echocardiogram was ordered.  Between her May visit and today, she has developed progressive exertional chest pain, dyspnea, and fatigue, often associated w/ tachypalpitations, arm numbness, and occasionally nausea.  Symptoms have been occurring almost every day and have also occurred @ rest and during the night.  Symptoms typically last 5-10 mins but on the evening of 6/9, she awoke w/ c/p, dyspnea, diaphoresis, and nausea that lasted almost the entire night.  Symptoms eventually resolved the next morning.  Since then, symptoms have continued in an intermittent fashion.  She and her husband went to a gospel festival last weekend and upon returning, in addition to c/p, dyspnea, she also noted significant lower ext edema.   They did eat out while they were away.  On 6/16, she presented to our office for her scheduled echo and reported what has ben occurring over the past month.  Decision was made to send to ED for eval and admission.  She has not had any c/p since ED arrival.  HsTrops are nl.  ECG non-acute.  Inpatient Medications    . aspirin EC  81 mg Oral Daily  . atorvastatin  40 mg Oral Daily  . insulin aspart  0-20 Units Subcutaneous TID WC  . insulin aspart  0-5 Units Subcutaneous QHS  . metoprolol tartrate  25 mg Oral BID  . sodium chloride flush  3 mL  Intravenous Once  . sodium chloride flush  3 mL Intravenous Q12H    Family History    Family History  Problem Relation Age of Onset  . Heart disease Father   . Hypertension Father   . Prostate cancer Father   . Stroke Father   . Osteoporosis Father   . Stroke Mother   . Depression Mother   . Headache Mother   . Heart disease Mother   . Thyroid disease Mother   . Hypertension Mother   . Diabetes Daughter   . Heart disease Daughter   . Hypertension Daughter   . Hypertension Son    She indicated that her mother is deceased. She indicated that her father is deceased. She indicated that her sister is alive. She indicated that the status  of her daughter is unknown. She indicated that the status of her son is unknown.   Social History    Social History   Socioeconomic History  . Marital status: Married    Spouse name: [name]   . Number of children: 2  . Years of education: College   . Highest education level: Not on file  Occupational History    Employer: retired  Tobacco Use  . Smoking status: Never Smoker  . Smokeless tobacco: Never Used  Vaping Use  . Vaping Use: Never used  Substance and Sexual Activity  . Alcohol use: No  . Drug use: No  . Sexual activity: Not Currently    Birth control/protection: Post-menopausal, Surgical  Other Topics Concern  . Not on file  Social History Narrative   Patient lives at home with husband [name].    Patient has 2 children.    Patient has [employer].    Patient is on disability.    Patient is right handed.    Social Determinants of Health   Financial Resource Strain:   . Difficulty of Paying Living Expenses:   Food Insecurity:   . Worried About Charity fundraiser in the Last Year:   . Arboriculturist in the Last Year:   Transportation Needs:   . Film/video editor (Medical):   [name] Lack of Transportation (Non-Medical):   Physical Activity:   . Days of Exercise per Week:   . Minutes of Exercise per Session:     Stress:   . Feeling of Stress :   Social Connections:   . Frequency of Communication with Friends and Family:   . Frequency of Social Gatherings with Friends and Family:   . Attends Religious Services:   . Active Member of Clubs or Organizations:   . Attends Archivist Meetings:   [name] Marital Status:   Intimate Partner Violence:   . Fear of Current or Ex-Partner:   . Emotionally Abused:   [name] Physically Abused:   . Sexually Abused:      Review of Systems    General:  No chills, fever, night sweats or weight changes.  Cardiovascular:  +++ chest pain, +++ dyspnea on exertion, +++  LE edema, no orthopnea, palpitations, paroxysmal nocturnal dyspnea. Dermatological: No rash, lesions/masses Respiratory: +++ cough, +++ dyspnea Urologic: No hematuria, dysuria Abdominal:   +++ choking on food/saliva in setting of h/o esoph stricture.  +++ nausea, +++ vomiting, +++ occas diarrhea in setting of IBS, no bright red blood per rectum, melena, or hematemesis Neurologic:  No visual changes, wkns, changes in mental status. All other systems reviewed and are otherwise negative except as noted above.  Physical Exam    Blood pressure (!) 117/59, pulse 72, temperature 99.1 F (37.3 C), temperature source Oral, resp. rate 10, height 5\' 4"  (1.626 m), weight 112.9 kg, SpO2 97 %.  General: Pleasant, NAD Psych: Normal affect. Neuro: Alert and oriented X 3. Moves all extremities spontaneously. HEENT: Normal  Neck: Supple obese, difficult to gauge JVP.  No bruits. Lungs:  Resp regular and unlabored, CTA. Heart: RRR no s3, s4, or murmurs. Abdomen: Soft, non-tender, non-distended, BS + x 4.  Extremities: No clubbing, cyanosis.  1+ bilat LE edema to mid-calves. DP/PT/Radials 2+ and equal bilaterally.  Labs    Cardiac Enzymes Recent Labs  Lab 12/30/19 1506 12/30/19 2006 12/31/19 0115  TROPONINIHS 7 9 8       Lab Results  Component Value Date   WBC 10.2  12/30/2019   HGB 11.3 (L)  12/30/2019   HCT 34.9 (L) 12/30/2019   MCV 79.5 (L) 12/30/2019   PLT 366 12/30/2019    Recent Labs  Lab 12/30/19 1506  NA 141  K 4.6  CL 107  CO2 23  BUN 13  CREATININE 1.06*  CALCIUM 8.7*  GLUCOSE 157*   Lab Results  Component Value Date   CHOL 183 12/31/2019   HDL 42 12/31/2019   LDLCALC 113 (H) 12/31/2019   TRIG 140 12/31/2019    Radiology Studies    DG Chest 2 View  Result Date: 12/30/2019 CLINICAL DATA:  Two weeks chest pain EXAM: CHEST - 2 VIEW COMPARISON:  CT 11/11/2019, radiograph 10/15/2018 FINDINGS: Some streaky atelectatic changes, bandlike area opacity in the lingula likely reflects an area of scarring seen on prior studies. No consolidation, features of edema, pneumothorax, or effusion. The cardiomediastinal contours are unremarkable. Prior reverse right shoulder arthroplasty. Mild to moderate degenerative changes in the left shoulder and spine. Cholecystectomy clips in the right upper quadrant. IMPRESSION: Mild bibasilar atelectasis with likely scarring in the lingula. No other acute cardiopulmonary disease. Electronically Signed   By: Lovena Le M.D.   On: 12/30/2019 16:21    ECG & Cardiac Imaging    RSR, 72, no acute ST/T changes - personally reviewed.  Assessment & Plan    1.  Unstable Angina:  Pt w/ a long history of intermittent chest pain with prior caths in 1987 and more recently 2014 (minor irregs).  Myoview in 05/2019 was non-ischemic.  Over the past month, she has been having intermittent, almost daily rest and exertional sscp w/ dyspnea, arm numbness, tachypalpitations, and occasionally nausea.  She had a particularly bad and prolonged episode that occurred during the night.  Intermittent symptoms since.  She reported Ss to our staff when she arrived for echo yesterday and was referred to the ED.  Here, ECG w/o acute ST/T changes.  HsTrops nl.  Currently pain free.  Given progression of symptoms w/ ongoing risk factors, we will pursue diagnostic  catheterization this AM.  The patient understands that risks include but are not limited to stroke (1 in 1000), death (1 in 1000), kidney failure [usually temporary] (1 in 500), bleeding (1 in 200), allergic reaction [possibly serious] (1 in 200), and agrees to proceed. .Cont asa, statin,  blocker.  If coronary anatomy stable w/o target for intervention/culprit lesion, will need GI eval as she does report a h/o esoph stricture s/p dilation ~ 10 yrs ago w/ return of symptoms of choking and dysphagia over the past three months.  2.  Essential HTN:  Stable on  blocker rx.  3.  HL:  Recent LDL of 113.  Agree w/ addition of atorvastatin.  4.  HFpEF:  Echo yesterday w/ nl EF and grade 2 diast dysfxn.  She reports chronic mild LEE that worsened this past weekend after traveling by car and eating out more frequently. 1+ bilat edema on exam today.  LVEDP will be assessed @ time of cath, may need low dose diuretic.  5.  Esophageal stricture: As above, pt w/ prior h/o dilations, last about 10 yrs ago.  Over the past three months, she has been experiencing dysphagia and choking w/ dry foods and even saliva @ times.  ? Role in recent persistent cough and also chest pain symptoms.  If cath unremarkable, will need GI eval. Currently scheduled for outpt GI visit in Aug, likely needs to be moved up.  6.  DMII:  A1c 7.2 in 05/2019. Per IM.  7.  OSA:  Cont cpap.  8.  Morbid obesity:  Suspect deconditioning is playing a significant role in her fatigue/dyspnea.  9.  Tachypalpitations/SVT:  Pt w/ brief episode of SVT overnight.  No reported symptoms however she says that she often notes elevations in heart rates in the setting of chest pain.  If no events on tele, would consider outpt Zio monitor to assess burden of tachyarrhythmias and potential link to c/p, dyspnea, fatigue symptoms.  Signed, Murray Hodgkins, NP 12/31/2019, 8:33 AM  For questions or updates, please contact   Please consult [email or web address] for  contact info under Cardiology/STEMI.
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
## 2019-12-31 NOTE — ED Notes (Signed)
Diet tray given

## 2019-12-31 NOTE — H&P (Signed)
History and Physical   Denise Sharp BSJ:[id number] DOB: 1946-02-04 DOA: 12/30/2019  Referring MD/NP/PA: Dr. Beather Arbour  PCP: [name], MD   Outpatient Specialists: Dr. Saunders Revel cardiology  Patient coming from: Home  Chief Complaint: Chest pain  HPI: Denise Sharp is a 74 y.o. female with medical history significant of morbid obesity, obstructive sleep apnea, hypertension, diabetes, degenerative disc disease, neuropathy, previous syncopal episode, normocephalic coronary artery disease who was seen by cardiology today for routine evaluation and was found to have evidence of cardiac decompensation.  Patient was supposed to get just an echocardiogram.  Complained of worsening chest pain over the last 2 weeks.  Also some shortness of breath.  Chest pain was initially with activities but now at rest.  There was exertional dyspnea.  Description of chest pain was more tightness.  There was questionable possible syncopal episode as well.  Normal EKG.  Cardiology seen patient over of with concern for possible anginal equivalents.  Plan is to do a cath prior to discharge at some point.  So far evaluation the ER including EKG and enzymes are negative.  Patient being admitted to the hospital therefore to complete work-up..  ED Course: Temperature 99.3 blood pressure 114/54 pulse 78 respirate of 19 oxygen sat 100% room air.  CBC showed hemoglobin 11.3 otherwise within normal chemistry showed creatinine 1.06 and calcium 8.7 the rest of the numbers are within normal.  Troponin so far negative.  EKG showed no new findings.  COVID-19 screen negative.  Patient being admitted to the hospital for further evaluation and treatment.  Review of Systems: As per HPI otherwise 10 point review of systems negative.    Past Medical History:  Diagnosis Date  . Abnormality of gait 03/25/2013  . Adrenal mass, left ([place])   . Anemia    iron deficient post  2 unit txfsn 2009, normal endo/colonoscopy by [facility]  . Arthritis    . Atypical chest pain    a. 04/1986 Cath ([facility]): nl cors, EF 65%; b. 07/2012 Cath [facility]): LM nl, LAD/LCX/RCA w/ min irregs; c. 07/2016 MV [facility]): "Equivocal" w/ small inf wall rev defect and hyperdynamic LV contraction, EF 87%; d. 08/2016 Cardiac CT Ca2+ score [facility]): Ca2+ score 1548.  [name] Cervical spinal stenosis 1994   due to trauma to back ([employer] accident), has intermittent paralysis and parasthesias  . Cervicogenic headache 03/23/2014  . Depression   . Dizziness    chronic dizziness  . DJD (degenerative joint disease)    a. Chronic R shoulder pain pending R shoulder replacement 07/2017.  [name] Esophageal stenosis March 2011   with transietn outlet obstruction by food, cleared by EGD   . Family history of adverse reaction to anesthesia    daughter PONV  . Gastric bypass status for obesity   . Headache(784.0)   . Hypertension   . IBS (irritable bowel syndrome)   . Left bundle branch block (LBBB)    a. Intermittently present - likely rate related. - patient denies  . Obesity   . Obstructive sleep apnea    using CPAP  . 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 Restless leg syndrome   . Rotator cuff arthropathy, right 08/13/2017  . Syncope and collapse 03/12/2014  . Type II diabetes mellitus ([place])     Past Surgical History:  Procedure Laterality Date  . ABDOMINAL HYSTERECTOMY    . APPENDECTOMY    . COLONOSCOPY    . DIAPHRAGMATIC HERNIA REPAIR  2015  . ESOPHAGEAL DILATION  multiple  . ESOPHAGOGASTRODUODENOSCOPY    . [place]  . GALLBLADDER SURGERY  resection  . GASTRIC BYPASS    . GASTRIC BYPASS  2000, 2005   Dr. Debroah Loop  . [facility] IMPLANT PLACEMENT  April 2013   [place]  . JOINT REPLACEMENT  2007   bilateral knee. [name],  [name]  . PANNICULECTOMY  06/16/2019  . PANNICULECTOMY N/A 06/16/2019   Procedure: PANNICULECTOMY;  Surgeon: [name], MD;  Location: [facility];  Service: Plastics;  Laterality: N/A;  3 hours, please  . REVERSE SHOULDER  ARTHROPLASTY Right 08/13/2017   Procedure: REVERSE RIGHT SHOULDER ARTHROPLASTY;  Surgeon: [name], MD;  Location: [facility];  Service: Orthopedics;  Laterality: Right;  . ROTATOR CUFF REPAIR     right  . [place]  . TOTAL ABDOMINAL HYSTERECTOMY W/ BILATERAL SALPINGOOPHORECTOMY  1974  . UMBILICAL HERNIA REPAIR  Aug 11, 2015     reports that she has never smoked. She has never used smokeless tobacco. She reports that she does not drink alcohol and does not use drugs.  Allergies  Allergen Reactions  . Biaxin [Clarithromycin] Nausea And Vomiting and Other (See Comments)    Severe irritable bowel  . Demeclocycline Hives  . Erythromycin Nausea And Vomiting and Other (See Comments)    Severe irritable bowel  . Flagyl [Metronidazole] Nausea And Vomiting and Other (See Comments)    Severe irritable bowel  . Glucophage [Metformin Hcl] Nausea And Vomiting and Other (See Comments)    "Sick" "I won't take anything that has metformin in it"  . Tetracyclines & Related Hives and Rash  . Diovan [Valsartan] Nausea Only       . Sulfa Antibiotics Rash and Other (See Comments)    As child  . Xanax [Alprazolam] Other (See Comments)    Unknown reaction    Family History  Problem Relation Age of Onset  . Heart disease Father   . Hypertension Father   . Prostate cancer Father   . Stroke Father   . Osteoporosis Father   . Stroke Mother   . Depression Mother   . Headache Mother   . Heart disease Mother   . Thyroid disease Mother   . Hypertension Mother   . Diabetes Daughter   . Heart disease Daughter   . Hypertension Daughter   . Hypertension Son      Prior to Admission medications   Medication Sig Start Date End Date Taking? Authorizing Provider  acetaminophen (TYLENOL) 650 MG CR tablet Take 650 mg by mouth every 8 (eight) hours as needed for pain.    [provider]  albuterol (VENTOLIN HFA) 108 (90 Base) MCG/ACT inhaler Inhale 2 puffs into the lungs every  8 (eight) hours as needed for wheezing or shortness of breath. 08/07/19   [name], MD  aspirin EC 81 MG tablet Take 1 tablet (81 mg total) by mouth daily. 03/06/17   End, Harrell Gave, MD  benzonatate (TESSALON) 200 MG capsule Take 1 capsule (200 mg total) by mouth 2 (two) times daily as needed for cough. 11/27/19   [name], MD  blood glucose meter kit and supplies Use to check sugars up to three times daily. Dx code: E11.9 10/22/19   [name], MD  Blood Glucose Monitoring Suppl (ONETOUCH VERIO) w/Device KIT 1 Device by Does not apply route daily. Use to test blood glucose once daily. Dx code = E11.9 12/30/18   [name],  MD  buPROPion (WELLBUTRIN XL) 150 MG 24 hr tablet Take 1 tablet (150 mg total) by mouth daily. 05/28/19   [name], MD  buPROPion (WELLBUTRIN) 75 MG tablet TAKE 1-2 TABLETS (75-150 MG TOTAL) BY MOUTH SEE ADMIN INSTRUCTIONS. TAKE 2 TABLETS IN THE MORNING AND 1 TABLET IN THE AFTERNOON 08/05/19   [name], MD  butalbital-acetaminophen-caffeine (FIORICET) [phone] MG tablet Take 1 tablet by mouth every 6 (six) hours as needed for headache. 05/28/19   [name], MD  cholestyramine light (PREVALITE) 4 g packet Take 4 g by mouth 3 (three) times daily as needed (diarrhea).  09/10/18   [provider]  citalopram (CELEXA) 10 MG tablet Take 1 tablet (10 mg total) by mouth daily. Patient taking differently: Take 10 mg by mouth every evening.  04/17/19   [name], MD  clotrimazole-betamethasone (LOTRISONE) cream Apply 1 application topically 2 (two) times daily. Patient taking differently: Apply 1 application topically 2 (two) times daily as needed (rash).  01/24/18   Gae Dry, MD  Continuous Blood Gluc Receiver (FREESTYLE LIBRE 14 DAY READER) DEVI 1 applicator by Does not apply route 4 (four) times daily. 10/02/19   [name], MD  Continuous Blood Gluc Sensor (FREESTYLE LIBRE 14 DAY SENSOR) MISC Use to test blood sugar 4 times  daily 10/02/19   [name], MD  cyanocobalamin (,VITAMIN B-12,) 1000 MCG/ML injection Inject 1 ml (1000 mcg ) IM weekly x 4,  Then monthly thereafter Patient taking differently: Inject 1,000 mcg into the muscle every 30 (thirty) days. On the 12th day 09/25/18   [name], MD  diazepam (VALIUM) 5 MG tablet Take 1 tablet (5 mg total) by mouth at bedtime as needed for anxiety. 11/27/19   [name], MD  dicyclomine (BENTYL) 10 MG capsule Take 1 capsule (10 mg total) by mouth 4 (four) times daily -  before meals and at bedtime. 07/29/19   [name], MD  diphenhydrAMINE (BENADRYL) 25 MG tablet Take 50 mg by mouth daily as needed for allergies.    [provider]  diphenoxylate-atropine (LOMOTIL) 2.5-0.025 MG tablet Take 1 tablet by mouth 4 (four) times daily as needed for diarrhea or loose stools. 11/27/19   [name], MD  Eszopiclone 3 MG TABS Take 1 tablet (3 mg total) by mouth at bedtime. 11/27/19   [name], MD  Insulin Glargine (LANTUS SOLOSTAR) 100 UNIT/ML Solostar Pen Inject 11 Units into the skin daily. Patient taking differently: Inject 10-13 Units into the skin daily.  03/31/19   [name], MD  Insulin Pen Needle 31G X 5 MM MISC Use to inject insulin daily 12/30/19   [name], MD  ipratropium (ATROVENT) 0.06 % nasal spray Place 2 sprays into both nostrils 4 (four) times daily. 08/21/19   [name], MD  Lancet Devices (ONE TOUCH DELICA LANCING DEV) MISC Use to test blood glucose once daily. Dx code = E11.9 12/16/17   [name], MD  levETIRAcetam (KEPPRA) 750 MG tablet Take 1 tablet (750 mg total) by mouth at bedtime. Patient taking differently: Take 750 mg by mouth at bedtime as needed (restless legs).  08/08/18   Kathrynn Ducking, MD  NONFORMULARY OR COMPOUNDED ITEM [facility]  Combination Pain Cream -  Baclofen 2%, Doxepin 5%, Gabapentin 6%, Topiramate 2%, Pentoxifylline 3% Apply 1-2 grams to affected area 3-4 times daily Qty.  120 gm 3 refills 10/14/17   Prudence Davidson,  Belenda Cruise, DPM  nystatin [facility]) powder Apply 1 g topically 4 (four) times daily as needed (rash).     [provider]  ondansetron (ZOFRAN ODT) 4 MG disintegrating tablet Take 1 tablet (4 mg total) by mouth every 8 (eight) hours as needed for nausea or vomiting. 08/07/19   [name], MD  OVER THE COUNTER MEDICATION Apply 1 application topically daily as needed (pain). [place]    [provider]  pregabalin (LYRICA) 100 MG capsule Take 100 mg by mouth at bedtime as needed (leg cramps).     [provider]  Syringe/Needle, Disp, (SYRINGE 3CC/25GX1") 25G X 1" 3 ML MISC Use for b12 injections 09/23/17   [name], MD  traMADol (ULTRAM) 50 MG tablet Take 0.5 tablets (25 mg total) by mouth every 12 (twelve) hours as needed for severe pain. 08/21/17   Regalado, Belkys A, MD  umeclidinium-vilanterol (ANORO ELLIPTA) 62.5-25 MCG/INH AEPB Inhale 1 puff into the lungs daily. 02/24/19   Baird Lyons D, MD    Physical Exam: Vitals:   12/30/19 1504 12/30/19 1521 12/30/19 1941 12/30/19 2330  BP: (!) 125/99  (!) 149/54 (!) 114/51  Pulse: 72  76 78  Resp: _0 Temp: 99.3 F (37.4 C)  99.1 F (37.3 C)   TempSrc: Oral  Oral   SpO2: 100%  100%   Weight:  112.9 kg    Height:  _1  (1.626 m)        Constitutional: Morbidly obese, no acute distress Vitals:   12/30/19 1504 12/30/19 1521 12/30/19 1941 12/30/19 2330  BP: (!) 125/99  (!) 149/54 (!) 114/51  Pulse: 72  76 78  Resp: _2 Temp: 99.3 F (37.4 C)  99.1 F (37.3 C)   TempSrc: Oral  Oral   SpO2: 100%  100%   Weight:  112.9 kg    Height:  _3  (1.626 m)     Eyes: PERRL, lids and conjunctivae normal ENMT: Mucous membranes are moist. Posterior pharynx clear of any exudate or lesions.Normal dentition.  Neck: normal, supple, no masses, no thyromegaly Respiratory: clear to auscultation bilaterally, no wheezing, no crackles. Normal respiratory effort. No  accessory muscle use.  Cardiovascular: Regular rate and rhythm, no murmurs / rubs / gallops. No extremity edema. 2+ pedal pulses. No carotid bruits.  Abdomen: no tenderness, no masses palpated. No hepatosplenomegaly. Bowel sounds positive.  Musculoskeletal: no clubbing / cyanosis. No joint deformity upper and lower extremities. Good ROM, no contractures. Normal muscle tone.  Skin: no rashes, lesions, ulcers. No induration Neurologic: CN 2-12 grossly intact. Sensation intact, DTR normal. Strength 5/5 in all 4.  Psychiatric: Normal judgment and insight. Alert and oriented x 3. Normal mood.     Labs on Admission: I have personally reviewed following labs and imaging studies  CBC: Recent Labs  Lab 12/30/19 1506  WBC 10.2  HGB 11.3*  HCT 34.9*  MCV 79.5*  PLT 951   Basic Metabolic Panel: Recent Labs  Lab 12/30/19 1506  NA 141  K 4.6  CL 107  CO2 23  GLUCOSE 157*  BUN 13  CREATININE 1.06*  CALCIUM 8.7*   GFR: Estimated Creatinine Clearance: 57.3 mL/min (A) (by C-G formula based on SCr of 1.06 mg/dL (H)). Liver Function Tests: No results for input(s): AST, ALT, ALKPHOS, BILITOT, PROT, ALBUMIN in the last 168 hours. No results for input(s): LIPASE, AMYLASE in the last 168 hours. No results for input(s): AMMONIA in the last  168 hours. Coagulation Profile: No results for input(s): INR, PROTIME in the last 168 hours. Cardiac Enzymes: No results for input(s): CKTOTAL, CKMB, CKMBINDEX, TROPONINI in the last 168 hours. BNP (last 3 results) No results for input(s): PROBNP in the last 8760 hours. HbA1C: No results for input(s): HGBA1C in the last 72 hours. CBG: Recent Labs  Lab 12/30/19 1940  GLUCAP 90   Lipid Profile: No results for input(s): CHOL, HDL, LDLCALC, TRIG, CHOLHDL, LDLDIRECT in the last 72 hours. Thyroid Function Tests: No results for input(s): TSH, T4TOTAL, FREET4, T3FREE, THYROIDAB in the last 72 hours. Anemia Panel: No results for input(s): VITAMINB12,  FOLATE, FERRITIN, TIBC, IRON, RETICCTPCT in the last 72 hours. Urine analysis:    Component Value Date/Time   COLORURINE YELLOW (A) 10/15/2018 2225   APPEARANCEUR HAZY (A) 10/15/2018 2225   APPEARANCEUR Clear 11/11/2014 1602   LABSPEC 1.019 10/15/2018 2225   LABSPEC 1.006 06/21/2014 1623   PHURINE 5.0 10/15/2018 2225   GLUCOSEU CANCELED 11/27/2019 1606   GLUCOSEU NEGATIVE 04/30/2018 1032   HGBUR SMALL (A) 10/15/2018 2225   BILIRUBINUR NEGATIVE 10/15/2018 2225   BILIRUBINUR 2+ 04/25/2018 1523   BILIRUBINUR Negative 11/11/2014 1602   BILIRUBINUR Negative 06/21/2014 1623   KETONESUR NEGATIVE 10/15/2018 2225   PROTEINUR CANCELED 11/27/2019 1606   PROTEINUR NEGATIVE 10/15/2018 2225   UROBILINOGEN 0.2 04/30/2018 1032   NITRITE NEGATIVE 10/15/2018 2225   LEUKOCYTESUR NEGATIVE 10/15/2018 2225   LEUKOCYTESUR Negative 06/21/2014 1623   Sepsis Labs: _0 (procalcitonin:4,lacticidven:4) ) Recent Results (from the past 240 hour(s))  SARS Coronavirus 2 by RT PCR (hospital order, performed in [facility] hospital lab) Nasopharyngeal Nasopharyngeal Swab     Status: None   Collection Time: 12/30/19 11:36 PM   Specimen: Nasopharyngeal Swab  Result Value Ref Range Status   SARS Coronavirus 2 NEGATIVE NEGATIVE Final    Comment: (NOTE) SARS-CoV-2 target nucleic acids are NOT DETECTED.  The SARS-CoV-2 RNA is generally detectable in upper and lower respiratory specimens during the acute phase of infection. The lowest concentration of SARS-CoV-2 viral copies this assay can detect is 250 copies / mL. A negative result does not preclude SARS-CoV-2 infection and should not be used as the sole basis for treatment or other patient management decisions.  A negative result may occur with improper specimen collection / handling, submission of specimen other than nasopharyngeal swab, presence of viral mutation(s) within the areas targeted by this assay, and inadequate number of viral copies (<250  copies / mL). A negative result must be combined with clinical observations, patient history, and epidemiological information.  Fact Sheet for Patients:   StrictlyIdeas.no  Fact Sheet for Healthcare Providers: BankingDealers.co.za  This test is not yet approved or  cleared by the [place] FDA and has been authorized for detection and/or diagnosis of SARS-CoV-2 by FDA under an Emergency Use Authorization (EUA).  This EUA will remain in effect (meaning this test can be used) for the duration of the COVID-19 declaration under Section 564(b)(1) of the Act, 21 U.S.C. section 360bbb-3(b)(1), unless the authorization is terminated or revoked sooner.  Performed at [facility], [address] [place], [place] [address]      Radiological Exams on Admission: DG Chest 2 View  Result Date: 12/30/2019 CLINICAL DATA:  Two weeks chest pain EXAM: CHEST - 2 VIEW COMPARISON:  CT 11/11/2019, radiograph 10/15/2018 FINDINGS: Some streaky atelectatic changes, bandlike area opacity in the lingula likely reflects an area of scarring seen on prior studies. No consolidation, features of edema, pneumothorax,  or effusion. The cardiomediastinal contours are unremarkable. Prior reverse right shoulder arthroplasty. Mild to moderate degenerative changes in the left shoulder and spine. Cholecystectomy clips in the right upper quadrant. IMPRESSION: Mild bibasilar atelectasis with likely scarring in the lingula. No other acute cardiopulmonary disease. Electronically Signed   By: Lovena Le M.D.   On: 12/30/2019 16:21   ECHOCARDIOGRAM COMPLETE  Result Date: 12/30/2019    ECHOCARDIOGRAM REPORT   Patient Name:   Denise Sharp Date of Exam: 12/30/2019 Medical Rec #:  [id number]         Height:       64.5 in Accession #:    [id number]        Weight:       253.0 lb Date of Birth:  [birth year]         BSA:          2.173 m Patient Age:    20 years          BP:            138/82 mmHg Patient Gender: F                 HR:           74 bpm. Exam Location:  [place] Procedure: 2D Echo, Cardiac Doppler, Color Doppler and Intracardiac            Opacification Agent Indications:    R06.02 SOB; R55 Syncope; R07.9* Chest pain, unspecified  History:        Patient has prior history of Echocardiogram examinations, most                 recent 08/21/2017. CAD, Signs/Symptoms:Shortness of Breath,                 Syncope, Chest Pain and Edema; Risk Factors:Sleep Apnea,                 Hypertension, Diabetes and Non-Smoker.  Sonographer:    Pilar Jarvis RDMS, RVT, RDCS Referring Phys: [phone] CHRISTOPHER END  Sonographer Comments: Following the echo, the patient was taken directly to the ED due to intermittent chest pain over the last 2-3 weeks IMPRESSIONS  1. Left ventricular ejection fraction, by estimation, is 65 to 70%. The left ventricle has normal function. The left ventricle has no regional wall motion abnormalities. Left ventricular diastolic parameters are consistent with Grade II diastolic dysfunction (pseudonormalization).  2. Right ventricular systolic function is normal. The right ventricular size is normal.  3. The mitral valve is normal in structure. No evidence of mitral valve regurgitation. No evidence of mitral stenosis.  4. The aortic valve is normal in structure. Aortic valve regurgitation is not visualized. No aortic stenosis is present. FINDINGS  Left Ventricle: Left ventricular ejection fraction, by estimation, is 65 to 70%. The left ventricle has normal function. The left ventricle has no regional wall motion abnormalities. Definity contrast agent was given IV to delineate the left ventricular  endocardial borders. The left ventricular internal cavity size was normal in size. There is no left ventricular hypertrophy. Left ventricular diastolic parameters are consistent with Grade II diastolic dysfunction (pseudonormalization). Right Ventricle: The right ventricular size is  normal. No increase in right ventricular wall thickness. Right ventricular systolic function is normal. Left Atrium: Left atrial size was normal in size. Right Atrium: Right atrial size was normal in size. Pericardium: There is no evidence of pericardial effusion. Mitral Valve: The mitral valve is normal in structure. Normal mobility  of the mitral valve leaflets. No evidence of mitral valve regurgitation. No evidence of mitral valve stenosis. MV peak gradient, 6.5 mmHg. The mean mitral valve gradient is 2.5 mmHg. Tricuspid Valve: The tricuspid valve is normal in structure. Tricuspid valve regurgitation is not demonstrated. No evidence of tricuspid stenosis. Aortic Valve: The aortic valve is normal in structure. Aortic valve regurgitation is not visualized. No aortic stenosis is present. Aortic valve mean gradient measures 4.0 mmHg. Aortic valve peak gradient measures 9.1 mmHg. Aortic valve area, by VTI measures 2.22 cm. Pulmonic Valve: The pulmonic valve was normal in structure. Pulmonic valve regurgitation is trivial. No evidence of pulmonic stenosis. Aorta: The aortic root is normal in size and structure. Venous: The inferior vena cava was not well visualized. IAS/Shunts: No atrial level shunt detected by color flow Doppler.  LEFT VENTRICLE PLAX 2D LVIDd:         4.50 cm  Diastology LVIDs:         2.40 cm  LV e' lateral:   4.65 cm/s LV PW:         1.20 cm  LV E/e' lateral: 28.0 LV IVS:        1.00 cm  LV e' medial:    6.38 cm/s LVOT diam:     1.80 cm  LV E/e' medial:  20.4 LV SV:         66 LV SV Index:   30 LVOT Area:     2.54 cm  RIGHT VENTRICLE RV S prime:     14.00 cm/s TAPSE (M-mode): 2.6 cm LEFT ATRIUM             Index LA diam:        4.10 cm 1.89 cm/m LA Vol (A2C):   61.5 ml 28.30 ml/m LA Vol (A4C):   38.0 ml 17.48 ml/m LA Biplane Vol: 48.6 ml 22.36 ml/m  AORTIC VALVE                   PULMONIC VALVE AV Area (Vmax):    1.84 cm    PV Vmax:       0.84 m/s AV Area (Vmean):   2.33 cm    PV Peak grad:   2.8 mmHg AV Area (VTI):     2.22 cm AV Vmax:           151.00 cm/s AV Vmean:          84.300 cm/s AV VTI:            0.298 m AV Peak Grad:      9.1 mmHg AV Mean Grad:      4.0 mmHg LVOT Vmax:         109.00 cm/s LVOT Vmean:        77.200 cm/s LVOT VTI:          0.260 m LVOT/AV VTI ratio: 0.87  AORTA Ao Root diam: 2.90 cm Ao Asc diam:  3.40 cm Ao Arch diam: 2.9 cm MITRAL VALVE MV Area (PHT): 2.96 cm     SHUNTS MV Peak grad:  6.5 mmHg     Systemic VTI:  0.26 m MV Mean grad:  2.5 mmHg     Systemic Diam: 1.80 cm MV Vmax:       1.27 m/s MV Vmean:      69.5 cm/s MV Decel Time: 256 msec MV E velocity: 130.00 cm/s MV A velocity: 84.80 cm/s MV E/A ratio:  1.53 Kate Sable MD Electronically signed by Kate Sable  MD Signature Date/Time: 12/30/2019/3:27:40 PM    Final     EKG: Independently reviewed.  Normal sinus rhythm with no significant changes  Assessment/Plan Principal Problem:   Angina at rest [facility]) Active Problems:   Essential hypertension   Diabetes mellitus type II, controlled ([place])   Hearing loss, right   OSA on CPAP   Morbid obesity ([place])     #1 angina pectoris: Patient will be admitted.  Cycle enzymes.  Placed on telemetry.  Cardiology to see patient in the morning for further evaluation and treatment.  Initiate IV heparin.  #2  Essential hypertension: Continue blood pressure control.  Use home regimen.  #3 diabetes: Sliding scale insulin with home regimen.  #4 obstructive sleep apnea: CPAP at night  #5 morbid obesity: Dietary counseling  DVT prophylaxis: IV heparin Code Status: Full code Family Communication: No family at bedside Disposition Plan: Home Consults called: Dr. Saunders Revel, cardiology Admission status: Observation  Severity of Illness: The appropriate patient status for this patient is OBSERVATION. Observation status is judged to be reasonable and necessary in order to provide the required intensity of service to ensure the patient's safety. The patient's  presenting symptoms, physical exam findings, and initial radiographic and laboratory data in the context of their medical condition is felt to place them at decreased risk for further clinical deterioration. Furthermore, it is anticipated that the patient will be medically stable for discharge from the hospital within 2 midnights of admission. The following factors support the patient status of observation.   " The patient's presenting symptoms include chest pain. " The physical exam findings include morbidly obese but no acute findings. " The initial radiographic and laboratory data are negative.     Barbette Merino MD [facility] Pager [phone]  If 7PM-7AM, please contact night-coverage [email or web address] Password [facility]  12/31/2019, 12:51 AM

## 2020-01-01 ENCOUNTER — Other Ambulatory Visit: Payer: Self-pay | Admitting: Internal Medicine

## 2020-01-01 ENCOUNTER — Other Ambulatory Visit: Payer: Self-pay

## 2020-01-01 ENCOUNTER — Encounter: Payer: Self-pay | Admitting: Cardiovascular Disease

## 2020-01-01 DIAGNOSIS — E1159 Type 2 diabetes mellitus with other circulatory complications: Secondary | ICD-10-CM

## 2020-01-01 LAB — GLUCOSE, CAPILLARY
Glucose-Capillary: 126 mg/dL — ABNORMAL HIGH (ref 70–99)
Glucose-Capillary: 223 mg/dL — ABNORMAL HIGH (ref 70–99)
Glucose-Capillary: 148 mg/dL — ABNORMAL HIGH (ref 70–99)
Glucose-Capillary: 216 mg/dL — ABNORMAL HIGH (ref 70–99)

## 2020-01-01 LAB — HEMOGLOBIN A1C
Hgb A1c MFr Bld: 7.7 % — ABNORMAL HIGH (ref 4.8–5.6)
Mean Plasma Glucose: 174 mg/dL

## 2020-01-01 MED ORDER — CLOPIDOGREL BISULFATE 75 MG PO TABS
300.0000 mg | ORAL_TABLET | Freq: Once | ORAL | Status: AC
  Administered 2020-01-02: 300 mg via ORAL
  Filled 2020-01-01: qty 4

## 2020-01-01 MED ORDER — CLOPIDOGREL BISULFATE 75 MG PO TABS
75.0000 mg | ORAL_TABLET | Freq: Every day | ORAL | Status: DC
  Administered 2020-01-03 – 2020-01-05 (×2): 75 mg via ORAL
  Filled 2020-01-01 (×3): qty 1

## 2020-01-01 MED ORDER — BD PEN NEEDLE MINI U/F 31G X 5 MM MISC: 3 refills | Status: DC

## 2020-01-01 NOTE — Progress Notes (Signed)
Progress Note  Patient Name: Denise Sharp Date of Encounter: 01/01/2020  [facility] Cardiologist: [name], MD   Subjective   Reports having headache this morning, Constipated, has not had bowel movement in several days Reports having nausea, breakfast in front of her, not able to eat Blood pressure low, documented 93/41 this morning She reports having low blood pressure in the past, orthostasis symptoms  Inpatient Medications    Scheduled Meds: . aspirin EC  81 mg Oral Daily  . atorvastatin  40 mg Oral Daily  . buPROPion  150 mg Oral q morning - 10a  . buPROPion  75 mg Oral QHS  . clopidogrel  300 mg Oral Once  . dicyclomine  10 mg Oral TID AC & HS  . enoxaparin (LOVENOX) injection  40 mg Subcutaneous Q24H  . famotidine  20 mg Oral Daily  . insulin aspart  0-20 Units Subcutaneous TID WC  . insulin aspart  0-5 Units Subcutaneous QHS  . insulin glargine  11 Units Subcutaneous Daily  . metoprolol tartrate  25 mg Oral BID  . pantoprazole  40 mg Oral Daily  . sodium chloride flush  3 mL Intravenous Q12H  . sodium chloride flush  3 mL Intravenous Q12H  . umeclidinium-vilanterol  1 puff Inhalation Daily   Continuous Infusions: . sodium chloride     PRN Meds: sodium chloride, acetaminophen, albuterol, alum & mag hydroxide-simeth, diazepam, levETIRAcetam, nitroGLYCERIN, ondansetron (ZOFRAN) IV, ondansetron (ZOFRAN) IV, pregabalin, sodium chloride flush, traMADol, zolpidem   Vital Signs    Vitals:   01/01/20 0754 01/01/20 0842 01/01/20 0847 01/01/20 1203  BP: (!) 100/39 (!) 108/45  (!) 93/41  Pulse: 66 70  78  Resp: 17  18 18   Temp: 99.2 F (37.3 C)   98.9 F (37.2 C)  TempSrc: Oral   Oral  SpO2: 97%   97%  Weight:      Height:        Intake/Output Summary (Last 24 hours) at 01/01/2020 1314 Last data filed at 01/01/2020 1000 Gross per 24 hour  Intake 0 ml  Output 0 ml  Net 0 ml   Last 3 Weights 01/01/2020 12/31/2019 12/31/2019  Weight (lbs) 246 lb  0.5 oz 252 lb 249 lb  Weight (kg) 111.6 kg 114.306 kg 112.946 kg      Telemetry    Normal sinus rhythm- Personally Reviewed  ECG     - Personally Reviewed  Physical Exam   GEN:  Headache, morbidly obese Neck: No JVD Cardiac: RRR, no murmurs, rubs, or gallops.  Respiratory: Clear to auscultation bilaterally. GI: Soft, nontender, non-distended  MS: No edema; No deformity. Neuro:  Nonfocal  Psych: Normal affect   Labs    High Sensitivity Troponin:   Recent Labs  Lab 12/30/19 1506 12/30/19 2006 12/31/19 0115  TROPONINIHS 7 9 8       Chemistry Recent Labs  Lab 12/30/19 1506  NA 141  K 4.6  CL 107  CO2 23  GLUCOSE 157*  BUN 13  CREATININE 1.06*  CALCIUM 8.7*  GFRNONAA 52*  GFRAA 60*  ANIONGAP 11     Hematology Recent Labs  Lab 12/30/19 1506  WBC 10.2  RBC 4.39  HGB 11.3*  HCT 34.9*  MCV 79.5*  MCH 25.7*  MCHC 32.4  RDW 17.2*  PLT 366    BNPNo results for input(s): BNP, PROBNP in the last 168 hours.   DDimer No results for input(s): DDIMER in the last 168 hours.   Radiology  CARDIAC CATHETERIZATION  Addendum Date: 01/01/2020    Ost LAD to Prox LAD lesion is 65% stenosed.  The left ventricular systolic function is normal.  LV end diastolic pressure is mildly elevated.  The left ventricular ejection fraction is 55-65% by visual estimate.  There is no mitral valve regurgitation.  Mid LAD lesion is 80% stenosed.    Result Date: 12/31/2019  Ost LAD to Prox LAD lesion is 65% stenosed.  The left ventricular systolic function is normal.  LV end diastolic pressure is mildly elevated.  The left ventricular ejection fraction is 55-65% by visual estimate.  There is no mitral valve regurgitation.    ECHOCARDIOGRAM COMPLETE  Result Date: 12/30/2019    ECHOCARDIOGRAM REPORT   Patient Name:   Denise Sharp Date of Exam: 12/30/2019 Medical Rec #:  [id number]         Height:       64.5 in Accession #:    [id number]        Weight:       253.0 lb  Date of Birth:  04-26-1946         BSA:          2.173 m Patient Age:    74 years          BP:           138/82 mmHg Patient Gender: F                 HR:           74 bpm. Exam Location:  [facility] Procedure: 2D Echo, Cardiac Doppler, Color Doppler and Intracardiac            Opacification Agent Indications:    R06.02 SOB; R55 Syncope; R07.9* Chest pain, unspecified  History:        Patient has prior history of Echocardiogram examinations, most                 recent 08/21/2017. CAD, Signs/Symptoms:Shortness of Breath,                 Syncope, Chest Pain and Edema; Risk Factors:Sleep Apnea,                 Hypertension, Diabetes and Non-Smoker.  Sonographer:    Pilar Jarvis RDMS, RVT, RDCS Referring Phys: [phone] [facility]STOP[facility]  Sonographer Comments: Following the echo, the patient was taken directly to the ED due to intermittent chest pain over the last 2-3 weeks IMPRESSIONS  1. Left ventricular ejection fraction, by estimation, is 65 to 70%. The left ventricle has normal function. The left ventricle has no regional wall motion abnormalities. Left ventricular diastolic parameters are consistent with Grade II diastolic dysfunction (pseudonormalization).  2. Right ventricular systolic function is normal. The right ventricular size is normal.  3. The mitral valve is normal in structure. No evidence of mitral valve regurgitation. No evidence of mitral stenosis.  4. The aortic valve is normal in structure. Aortic valve regurgitation is not visualized. No aortic stenosis is present. FINDINGS  Left Ventricle: Left ventricular ejection fraction, by estimation, is 65 to 70%. The left ventricle has normal function. The left ventricle has no regional wall motion abnormalities. Definity contrast agent was given IV to delineate the left ventricular  endocardial borders. The left ventricular internal cavity size was normal in size. There is no left ventricular hypertrophy. Left ventricular diastolic parameters are consistent with  Grade II diastolic dysfunction (pseudonormalization). Right Ventricle: The right ventricular size is  normal. No increase in right ventricular wall thickness. Right ventricular systolic function is normal. Left Atrium: Left atrial size was normal in size. Right Atrium: Right atrial size was normal in size. Pericardium: There is no evidence of pericardial effusion. Mitral Valve: The mitral valve is normal in structure. Normal mobility of the mitral valve leaflets. No evidence of mitral valve regurgitation. No evidence of mitral valve stenosis. MV peak gradient, 6.5 mmHg. The mean mitral valve gradient is 2.5 mmHg. Tricuspid Valve: The tricuspid valve is normal in structure. Tricuspid valve regurgitation is not demonstrated. No evidence of tricuspid stenosis. Aortic Valve: The aortic valve is normal in structure. Aortic valve regurgitation is not visualized. No aortic stenosis is present. Aortic valve mean gradient measures 4.0 mmHg. Aortic valve peak gradient measures 9.1 mmHg. Aortic valve area, by VTI measures 2.22 cm. Pulmonic Valve: The pulmonic valve was normal in structure. Pulmonic valve regurgitation is trivial. No evidence of pulmonic stenosis. Aorta: The aortic root is normal in size and structure. Venous: The inferior vena cava was not well visualized. IAS/Shunts: No atrial level shunt detected by color flow Doppler.  LEFT VENTRICLE PLAX 2D LVIDd:         4.50 cm  Diastology LVIDs:         2.40 cm  LV e' lateral:   4.65 cm/s LV PW:         1.20 cm  LV E/e' lateral: 28.0 LV IVS:        1.00 cm  LV e' medial:    6.38 cm/s LVOT diam:     1.80 cm  LV E/e' medial:  20.4 LV SV:         66 LV SV Index:   30 LVOT Area:     2.54 cm  RIGHT VENTRICLE RV S prime:     14.00 cm/s TAPSE (M-mode): 2.6 cm LEFT ATRIUM             Index LA diam:        4.10 cm 1.89 cm/m LA Vol (A2C):   61.5 ml 28.30 ml/m LA Vol (A4C):   38.0 ml 17.48 ml/m LA Biplane Vol: 48.6 ml 22.36 ml/m  AORTIC VALVE                   PULMONIC VALVE  AV Area (Vmax):    1.84 cm    PV Vmax:       0.84 m/s AV Area (Vmean):   2.33 cm    PV Peak grad:  2.8 mmHg AV Area (VTI):     2.22 cm AV Vmax:           151.00 cm/s AV Vmean:          84.300 cm/s AV VTI:            0.298 m AV Peak Grad:      9.1 mmHg AV Mean Grad:      4.0 mmHg LVOT Vmax:         109.00 cm/s LVOT Vmean:        77.200 cm/s LVOT VTI:          0.260 m LVOT/AV VTI ratio: 0.87  AORTA Ao Root diam: 2.90 cm Ao Asc diam:  3.40 cm Ao Arch diam: 2.9 cm MITRAL VALVE MV Area (PHT): 2.96 cm     SHUNTS MV Peak grad:  6.5 mmHg     Systemic VTI:  0.26 m MV Mean grad:  2.5 mmHg  Systemic Diam: 1.80 cm MV Vmax:       1.27 m/s MV Vmean:      69.5 cm/s MV Decel Time: 256 msec MV E velocity: 130.00 cm/s MV A velocity: 84.80 cm/s MV E/A ratio:  1.53 Kate Sable MD Electronically signed by Kate Sable MD Signature Date/Time: 12/30/2019/3:27:40 PM    Final     Cardiac Studies   Cardiac catheterization 65% ostial/proximal LAD disease 80% mid LAD disease  Patient Profile      74 year old woman with morbid obesity, catheterization 2014 with nonobstructive coronary disease, diabetes type 2, sleep apnea, failure to thrive/weakness, presenting with chest pain/angina.    Cardiac catheterization yesterday 65% ostial LAD disease, 80% mid LAD disease.     Assessment & Plan    Unstable angina Catheterization as above Not intervened upon at that time secondary to concern for tortuosity, haziness in the ostial region.  Case discussed with Dr. Saunders Revel.  He feels that intervention could be performed if approached cautiously.  -Not a good candidate today, hypotensive, likely secondary to Imdur also with nausea, constipation, dizziness Will discuss with Dr. Fletcher Anon whether he feels this can be treated on Monday at this facility  Morbid obesity Failure to thrive at home, Reports a general inability to take care of herself at home, husband unable to care for her -She is incontinent of stool, uses  diapers House is a mess, no one has cleaned and a long time -She prefers to stay in the hospital at this time  Hypotension/orthostasis Imdur held,  she reports a long history of orthostasis Suspect her chronic supine state and lack of mobility contributing to symptoms -If blood pressure remains low, may need IV fluid bolus Unlikely to go home today with such low pressure  Constipation Reports this is not good for her as typically she is very loose, has bowel incontinence, uses depends undergarments She has abdominal pain, nausea today unLikely be able to go home she feels without a bowel movement  Numerous discussions with hospitalist service concerning plan of care Answered patient's numerous list of questions, discussed her numerous complaints/medical symptoms  Total encounter time more than 35 minutes  Greater than 50% was spent in counseling and coordination of care with the patient   For questions or updates, please contact [facility] Please consult [email or web address] for contact info under        Signed, Ida Rogue, MD  01/01/2020, 1:14 PM

## 2020-01-01 NOTE — Evaluation (Signed)
Physical Therapy Evaluation Patient Details Name: Denise Sharp MRN: [id number] DOB: 12-06-1945 Today's Date: 01/01/2020   History of Present Illness  REKIA KUJALA is a 74 y.o. female with medical history significant of morbid obesity, obstructive sleep apnea, hypertension, diabetes, degenerative disc disease, neuropathy, previous syncopal episode, normocephalic coronary artery disease who was seen by cardiology today for routine evaluation and was found to have evidence of cardiac decompensation.  Clinical Impression  Patient received in bed. Agreeable to PT assessment. She reports she is only able to walk a little bit. Concerned about upcoming heart cath procedure. She reports her husband helps her with her lower body dressing as she cannot feel much below the knees. Reports multiple falls in the past. Performed bed mobility with mod independence, transfers with min guard, ambulated 170 feet with min guard and RW. Ambulates at normal cadence. Reports sob with mobility, but continually talking throughout session. O2 sats at 97% after walking. She will continue to benefit from skilled PT while here to improve strength and functional independence for return home.      Follow Up Recommendations Home health PT;Supervision for mobility/OOB    Equipment Recommendations       Recommendations for Other Services       Precautions / Restrictions Precautions Precautions: Fall Restrictions Weight Bearing Restrictions: No      Mobility  Bed Mobility Overal bed mobility: Modified Independent                Transfers Overall transfer level: Needs assistance Equipment used: Rolling walker (2 wheeled) Transfers: Sit to/from Stand Sit to Stand: Min guard            Ambulation/Gait Ambulation/Gait assistance: Min guard Gait Distance (Feet): 170 Feet Assistive device: Rolling walker (2 wheeled) Gait Pattern/deviations: Step-through pattern;Trunk flexed Gait velocity: WNL    General Gait Details: Patient ambulates at normal pace, reports sob but still talking and walking. HR up to 101 with ambulation. O2 sats 97%.  Stairs            Wheelchair Mobility    Modified Rankin (Stroke Patients Only)       Balance Overall balance assessment: Needs assistance Sitting-balance support: Feet supported Sitting balance-Leahy Scale: Fair     Standing balance support: Bilateral upper extremity supported;During functional activity Standing balance-Leahy Scale: Fair Standing balance comment: reliant on RW for stability                             Pertinent Vitals/Pain Pain Assessment: Faces Faces Pain Scale: Hurts a little bit Pain Location: headache Pain Descriptors / Indicators: Aching Pain Intervention(s): Monitored during session    Home Living Family/patient expects to be discharged to:: Private residence Living Arrangements: Spouse/significant other Available Help at Discharge: Family;Available 24 hours/day Type of Home: House Home Access: Ramped entrance     Home Layout: One level Home Equipment: Walker - 2 wheels;Shower seat;Grab bars - toilet;Grab bars - tub/shower Additional Comments: Used SPC prior to admission in home. Uses scooter in store if needed    Prior Function Level of Independence: Needs assistance   Gait / Transfers Assistance Needed: short, household distances; furniture walks  ADL's / Homemaking Assistance Needed: Assist from husband for lower body ADLs including toileting        Hand Dominance        Extremity/Trunk Assessment   Upper Extremity Assessment Upper Extremity Assessment: Generalized weakness    Lower Extremity Assessment Lower  Extremity Assessment: Generalized weakness    Cervical / Trunk Assessment Cervical / Trunk Assessment: Normal  Communication   Communication: No difficulties  Cognition Arousal/Alertness: Awake/alert Behavior During Therapy: WFL for tasks  assessed/performed Overall Cognitive Status: Within Functional Limits for tasks assessed                                 General Comments: very talkative, difficult to keep on task      General Comments      Exercises Other Exercises Other Exercises: Seated LE strengthening exercises: LAQ, hip abd/add, marching x 10 reps independently   Assessment/Plan    PT Assessment Patient needs continued PT services  PT Problem List Decreased mobility;Decreased activity tolerance;Decreased balance;Decreased safety awareness       PT Treatment Interventions Therapeutic activities;Gait training;Therapeutic exercise;Functional mobility training;Balance training;Patient/family education    PT Goals (Current goals can be found in the Care Plan section)  Acute Rehab PT Goals Patient Stated Goal: to return home PT Goal Formulation: With patient Time For Goal Achievement: 01/15/20 Potential to Achieve Goals: Fair    Frequency Min 2X/week   Barriers to discharge Decreased caregiver support lives with husband who provides some assist    Co-evaluation               AM-PAC PT "6 Clicks" Mobility  Outcome Measure Help needed turning from your back to your side while in a flat bed without using bedrails?: None Help needed moving from lying on your back to sitting on the side of a flat bed without using bedrails?: A Little Help needed moving to and from a bed to a chair (including a wheelchair)?: A Little Help needed standing up from a chair using your arms (e.g., wheelchair or bedside chair)?: A Little Help needed to walk in hospital room?: A Little Help needed climbing 3-5 steps with a railing? : A Lot 6 Click Score: 18    End of Session Equipment Utilized During Treatment: Gait belt Activity Tolerance: Patient tolerated treatment well Patient left: in bed;with call bell/phone within reach Nurse Communication: Mobility status PT Visit Diagnosis: Unsteadiness on feet  (R26.81);History of falling (Z91.81);Muscle weakness (generalized) (M62.81)    Time: 1300-1330 PT Time Calculation (min) (ACUTE ONLY): 30 min   Charges:   PT Evaluation $PT Eval Moderate Complexity: 1 Mod PT Treatments $Gait Training: 8-22 mins        Aisha Greenberger, PT, GCS 01/01/20,3:05 PM

## 2020-01-01 NOTE — Progress Notes (Signed)
Denise Sharp at [place] NAME: Denise Sharp    MR#:  [id number]  DATE OF BIRTH:  1946/06/26  SUBJECTIVE:  Headache earlier No cp or sob bp 93/41 Wants to go home and get her things!  REVIEW OF SYSTEMS:   Review of Systems  Constitutional: Negative for chills, fever and weight loss.  HENT: Negative for ear discharge, ear pain and nosebleeds.   Eyes: Negative for blurred vision, pain and discharge.  Respiratory: Negative for sputum production, shortness of breath, wheezing and stridor.   Cardiovascular: Negative for chest pain, palpitations, orthopnea and PND.  Gastrointestinal: Positive for heartburn. Negative for abdominal pain, diarrhea, nausea and vomiting.  Genitourinary: Negative for frequency and urgency.  Musculoskeletal: Negative for back pain and joint pain.  Neurological: Positive for weakness. Negative for sensory change, speech change and focal weakness.  Psychiatric/Behavioral: Negative for depression and hallucinations. The patient is not nervous/anxious.    Tolerating Diet:yes Tolerating PT: pending  DRUG ALLERGIES:   Allergies  Allergen Reactions  . Demeclocycline Hives  . Erythromycin Nausea And Vomiting and Other (See Comments)    Severe irritable bowel  . Flagyl [Metronidazole] Nausea And Vomiting and Other (See Comments)    Severe irritable bowel  . Glucophage [Metformin Hcl] Nausea And Vomiting and Other (See Comments)    "Sick" "I won't take anything that has metformin in it"  . Tetracyclines & Related Hives and Rash  . Diovan [Valsartan] Nausea Only       . Sulfa Antibiotics Rash and Other (See Comments)    As child  . Xanax [Alprazolam] Other (See Comments)    Unknown reaction    VITALS:  Blood pressure (!) 93/41, pulse 78, temperature 98.9 F (37.2 C), temperature source Oral, resp. rate 18, height 5\' 4"  (1.626 m), weight 111.6 kg, SpO2 97 %.  PHYSICAL EXAMINATION:   Physical Exam  GENERAL:  74  y.o.-year-old patient lying in the bed with no acute distress. Obese EYES: Pupils equal, round, reactive to light and accommodation. No scleral icterus.   HEENT: Head atraumatic, normocephalic. Oropharynx and nasopharynx clear.  NECK:  Supple, no jugular venous distention. No thyroid enlargement, no tenderness.  LUNGS: Normal breath sounds bilaterally, no wheezing, rales, rhonchi. No use of accessory muscles of respiration.  CARDIOVASCULAR: S1, S2 normal. No murmurs, rubs, or gallops.  ABDOMEN: Soft, nontender, nondistended. Bowel sounds present. No organomegaly or mass.  EXTREMITIES: No cyanosis, clubbing or edema b/l.    NEUROLOGIC: Cranial nerves II through XII are intact. No focal Motor or sensory deficits b/l.   PSYCHIATRIC:  patient is alert and oriented x 3.  SKIN: No obvious rash, lesion, or ulcer.   LABORATORY PANEL:  CBC Recent Labs  Lab 12/30/19 1506  WBC 10.2  HGB 11.3*  HCT 34.9*  PLT 366    Chemistries  Recent Labs  Lab 12/30/19 1506  NA 141  K 4.6  CL 107  CO2 23  GLUCOSE 157*  BUN 13  CREATININE 1.06*  CALCIUM 8.7*   Cardiac Enzymes No results for input(s): TROPONINI in the last 168 hours. RADIOLOGY:  CARDIAC CATHETERIZATION  Addendum Date: 01/01/2020    Ost LAD to Prox LAD lesion is 65% stenosed.  The left ventricular systolic function is normal.  LV end diastolic pressure is mildly elevated.  The left ventricular ejection fraction is 55-65% by visual estimate.  There is no mitral valve regurgitation.  Mid LAD lesion is 80% stenosed.    Result Date:  12/31/2019  Ost LAD to Prox LAD lesion is 65% stenosed.  The left ventricular systolic function is normal.  LV end diastolic pressure is mildly elevated.  The left ventricular ejection fraction is 55-65% by visual estimate.  There is no mitral valve regurgitation.    ECHOCARDIOGRAM COMPLETE  Result Date: 12/30/2019    ECHOCARDIOGRAM REPORT   Patient Name:   Denise Sharp Date of Exam:  12/30/2019 Medical Rec #:  [id number]         Height:       64.5 in Accession #:    [id number]        Weight:       253.0 lb Date of Birth:  [birth year]         BSA:          2.173 m Patient Age:    74 years          BP:           138/82 mmHg Patient Gender: F                 HR:           74 bpm. Exam Location:  [facility] Procedure: 2D Echo, Cardiac Doppler, Color Doppler and Intracardiac            Opacification Agent Indications:    R06.02 SOB; R55 Syncope; R07.9* Chest pain, unspecified  History:        Patient has prior history of Echocardiogram examinations, most                 recent 08/21/2017. CAD, Signs/Symptoms:Shortness of Breath,                 Syncope, Chest Pain and Edema; Risk Factors:Sleep Apnea,                 Hypertension, Diabetes and Non-Smoker.  Sonographer:    Pilar Jarvis RDMS, RVT, RDCS Referring Phys: [phone] [facility]  Sonographer Comments: Following the echo, the patient was taken directly to the ED due to intermittent chest pain over the last 2-3 weeks IMPRESSIONS  1. Left ventricular ejection fraction, by estimation, is 65 to 70%. The left ventricle has normal function. The left ventricle has no regional wall motion abnormalities. Left ventricular diastolic parameters are consistent with Grade II diastolic dysfunction (pseudonormalization).  2. Right ventricular systolic function is normal. The right ventricular size is normal.  3. The mitral valve is normal in structure. No evidence of mitral valve regurgitation. No evidence of mitral stenosis.  4. The aortic valve is normal in structure. Aortic valve regurgitation is not visualized. No aortic stenosis is present. FINDINGS  Left Ventricle: Left ventricular ejection fraction, by estimation, is 65 to 70%. The left ventricle has normal function. The left ventricle has no regional wall motion abnormalities. Definity contrast agent was given IV to delineate the left ventricular  endocardial borders. The left ventricular internal cavity  size was normal in size. There is no left ventricular hypertrophy. Left ventricular diastolic parameters are consistent with Grade II diastolic dysfunction (pseudonormalization). Right Ventricle: The right ventricular size is normal. No increase in right ventricular wall thickness. Right ventricular systolic function is normal. Left Atrium: Left atrial size was normal in size. Right Atrium: Right atrial size was normal in size. Pericardium: There is no evidence of pericardial effusion. Mitral Valve: The mitral valve is normal in structure. Normal mobility of the mitral valve leaflets. No evidence of mitral valve regurgitation. No evidence  of mitral valve stenosis. MV peak gradient, 6.5 mmHg. The mean mitral valve gradient is 2.5 mmHg. Tricuspid Valve: The tricuspid valve is normal in structure. Tricuspid valve regurgitation is not demonstrated. No evidence of tricuspid stenosis. Aortic Valve: The aortic valve is normal in structure. Aortic valve regurgitation is not visualized. No aortic stenosis is present. Aortic valve mean gradient measures 4.0 mmHg. Aortic valve peak gradient measures 9.1 mmHg. Aortic valve area, by VTI measures 2.22 cm. Pulmonic Valve: The pulmonic valve was normal in structure. Pulmonic valve regurgitation is trivial. No evidence of pulmonic stenosis. Aorta: The aortic root is normal in size and structure. Venous: The inferior vena cava was not well visualized. IAS/Shunts: No atrial level shunt detected by color flow Doppler.  LEFT VENTRICLE PLAX 2D LVIDd:         4.50 cm  Diastology LVIDs:         2.40 cm  LV e' lateral:   4.65 cm/s LV PW:         1.20 cm  LV E/e' lateral: 28.0 LV IVS:        1.00 cm  LV e' medial:    6.38 cm/s LVOT diam:     1.80 cm  LV E/e' medial:  20.4 LV SV:         66 LV SV Index:   30 LVOT Area:     2.54 cm  RIGHT VENTRICLE RV S prime:     14.00 cm/s TAPSE (M-mode): 2.6 cm LEFT ATRIUM             Index LA diam:        4.10 cm 1.89 cm/m LA Vol (A2C):   61.5 ml 28.30  ml/m LA Vol (A4C):   38.0 ml 17.48 ml/m LA Biplane Vol: 48.6 ml 22.36 ml/m  AORTIC VALVE                   PULMONIC VALVE AV Area (Vmax):    1.84 cm    PV Vmax:       0.84 m/s AV Area (Vmean):   2.33 cm    PV Peak grad:  2.8 mmHg AV Area (VTI):     2.22 cm AV Vmax:           151.00 cm/s AV Vmean:          84.300 cm/s AV VTI:            0.298 m AV Peak Grad:      9.1 mmHg AV Mean Grad:      4.0 mmHg LVOT Vmax:         109.00 cm/s LVOT Vmean:        77.200 cm/s LVOT VTI:          0.260 m LVOT/AV VTI ratio: 0.87  AORTA Ao Root diam: 2.90 cm Ao Asc diam:  3.40 cm Ao Arch diam: 2.9 cm MITRAL VALVE MV Area (PHT): 2.96 cm     SHUNTS MV Peak grad:  6.5 mmHg     Systemic VTI:  0.26 m MV Mean grad:  2.5 mmHg     Systemic Diam: 1.80 cm MV Vmax:       1.27 m/s MV Vmean:      69.5 cm/s MV Decel Time: 256 msec MV E velocity: 130.00 cm/s MV A velocity: 84.80 cm/s MV E/A ratio:  1.53 Kate Sable MD Electronically signed by Kate Sable MD Signature Date/Time: 12/30/2019/3:27:40 PM    Final    ASSESSMENT  AND PLAN:  MAO LOCKNER is a 74 y.o. female sent to the ED from Dr. Darnelle Bos office for admission.  Patient with a history of chest pain, OSA, type 2 diabetes who has been experiencing waxing/waning chest pain with shortness of breath over the past month.  She was at Dr. Darnelle Bos office today for an echocardiogram when she noted that her symptoms were worsening.  #1 Unstable angina  -  Cycle enzymes flat   -cardiac cath showed Nonobstructive disease, ostial/proximal LAD 60 to 65% Otherwise no significant disease in other vessels Normal ejection fraction -added imdur  by Dr Tacey Ruiz d/ced due to HA and low bp -needs her lesion be intervened. Cannot be done today due to low BP -pt wants to get it done during this time. Will need to wait till Monday.  #2  Essential hypertension: - Continue blood pressure control.  -Metoprolol  #3 diabetes: Sliding scale insulin with home Lantus 11 units qd  #4  obstructive sleep apnea: CPAP at night  #5 morbid obesity: Dietary counseling  #6 Depression -cont wellbutrin and diazepam  #7 RLS--on keppra as needed  #8 GERD with dysphagia -pt has appt to see dr Allen Norris as out pt -cont PPI and mylanta as needed  DVT prophylaxis: IV heparin Code Status: Full code Family Communication: Husband at bedside Disposition Plan: Home Consults called: Dr. Rockey Situ, cardiology Procedures:Cardiac cath--no intervention--however will need possible intervention next week  Status is: Inpatient  Remains inpatient appropriate because:monitoring overnited after cath. pt wants to stay to get intervention done here   Dispo: The patient is from: Home              Anticipated d/c is to: Home              Anticipated d/c date is: next week since pt wants to get her cath done next week.                  TOTAL TIME TAKING CARE OF THIS PATIENT: *25* minutes.  >50% time spent on counselling and coordination of care  Note: This dictation was prepared with Dragon dictation along with smaller phrase technology. Any transcriptional errors that result from this process are unintentional.  Fritzi Mandes M.D    [facility]   CC: Primary care physician; [name], MDPatient ID: [name], female   DOB: 1946/02/18, 74 y.o.   MRN: [id number]

## 2020-01-02 LAB — GLUCOSE, CAPILLARY
Glucose-Capillary: 136 mg/dL — ABNORMAL HIGH (ref 70–99)
Glucose-Capillary: 155 mg/dL — ABNORMAL HIGH (ref 70–99)
Glucose-Capillary: 136 mg/dL — ABNORMAL HIGH (ref 70–99)
Glucose-Capillary: 201 mg/dL — ABNORMAL HIGH (ref 70–99)

## 2020-01-02 NOTE — H&P (View-Only) (Signed)
Progress Note  Patient Name: Denise Sharp Date of Encounter: 01/02/2020  Primary Cardiologist:   [name], MD   Subjective   She had sone chest pain this earlier today.  Currently pain free.  No SOB.  No distress.  Many questions  Inpatient Medications    Scheduled Meds: . aspirin EC  81 mg Oral Daily  . atorvastatin  40 mg Oral Daily  . buPROPion  150 mg Oral q morning - 10a  . buPROPion  75 mg Oral QHS  . clopidogrel  75 mg Oral Daily  . dicyclomine  10 mg Oral TID AC & HS  . enoxaparin (LOVENOX) injection  40 mg Subcutaneous Q24H  . famotidine  20 mg Oral Daily  . insulin aspart  0-20 Units Subcutaneous TID WC  . insulin aspart  0-5 Units Subcutaneous QHS  . insulin glargine  11 Units Subcutaneous Daily  . metoprolol tartrate  25 mg Oral BID  . pantoprazole  40 mg Oral Daily  . sodium chloride flush  3 mL Intravenous Q12H  . sodium chloride flush  3 mL Intravenous Q12H  . umeclidinium-vilanterol  1 puff Inhalation Daily   Continuous Infusions: . sodium chloride     PRN Meds: sodium chloride, acetaminophen, albuterol, alum & mag hydroxide-simeth, diazepam, levETIRAcetam, nitroGLYCERIN, ondansetron (ZOFRAN) IV, ondansetron (ZOFRAN) IV, pregabalin, sodium chloride flush, traMADol, zolpidem   Vital Signs    Vitals:   01/01/20 1544 01/01/20 2039 01/02/20 0525 01/02/20 0801  BP: (!) 92/38 (!) 110/50 128/65 (!) 115/53  Pulse: 71 70 73 68  Resp: 18 16 18    Temp: 100.1 F (37.8 C) 99.1 F (37.3 C) 98.5 F (36.9 C) 98.8 F (37.1 C)  TempSrc: Oral Oral Oral Oral  SpO2: 96% 99% 97% 97%  Weight:   114.6 kg   Height:        Intake/Output Summary (Last 24 hours) at 01/02/2020 0854 Last data filed at 01/01/2020 1000 Gross per 24 hour  Intake 0 ml  Output 0 ml  Net 0 ml   Filed Weights   12/31/19 1702 01/01/20 0500 01/02/20 0525  Weight: 114.3 kg 111.6 kg 114.6 kg    Telemetry    PVCs, PACs, NSR predominantly. - Personally Reviewed  ECG    NA -  Personally Reviewed  Physical Exam   GEN: No acute distress.   Neck: No  JVD Cardiac: RRR, no murmurs, rubs, or gallops.  Respiratory: Clear  to auscultation bilaterally. GI: Soft, nontender, non-distended  MS: No  edema; No deformity.  Right femoral access site without bleeding or brusing Neuro:  Nonfocal  Psych: Normal affect   Labs    Chemistry Recent Labs  Lab 12/30/19 1506  NA 141  K 4.6  CL 107  CO2 23  GLUCOSE 157*  BUN 13  CREATININE 1.06*  CALCIUM 8.7*  GFRNONAA 52*  GFRAA 60*  ANIONGAP 11     Hematology Recent Labs  Lab 12/30/19 1506  WBC 10.2  RBC 4.39  HGB 11.3*  HCT 34.9*  MCV 79.5*  MCH 25.7*  MCHC 32.4  RDW 17.2*  PLT 366    Cardiac EnzymesNo results for input(s): TROPONINI in the last 168 hours. No results for input(s): TROPIPOC in the last 168 hours.   BNPNo results for input(s): BNP, PROBNP in the last 168 hours.   DDimer No results for input(s): DDIMER in the last 168 hours.   Radiology    CARDIAC CATHETERIZATION  Addendum Date: 01/01/2020  Ost LAD to Prox LAD lesion is 65% stenosed.  The left ventricular systolic function is normal.  LV end diastolic pressure is mildly elevated.  The left ventricular ejection fraction is 55-65% by visual estimate.  There is no mitral valve regurgitation.  Mid LAD lesion is 80% stenosed.    Result Date: 12/31/2019  Ost LAD to Prox LAD lesion is 65% stenosed.  The left ventricular systolic function is normal.  LV end diastolic pressure is mildly elevated.  The left ventricular ejection fraction is 55-65% by visual estimate.  There is no mitral valve regurgitation.     Cardiac Studies   CATH:    Diagnostic Dominance: Right     Patient Profile     74 y.o. female with morbid obesity, catheterization 2014 with nonobstructive coronary disease, diabetes type 2, sleep apnea, failure to thrive/weakness, presenting with chest pain/angina.  Cardiac catheterization yesterday 65% ostial LAD  disease, 80% mid LAD disease.     Assessment & Plan    UNSTABLE ANGINA:  Cath as above with plans for PCI on Monday.  I reviewed the films with her and went through a long discussion about the plans for Monday.  I think she has a clear understanding.  Continue current therapy.   HYPOTENSION:   Improved.  Imdur held.     DYSLIPIDEMIA:  LDL is not at target.  She is now on Lipitor.      For questions or updates, please contact [place] Please consult [email or web address] for contact info under Cardiology/STEMI.   Signed, Minus Breeding, MD  01/02/2020, 8:54 AM

## 2020-01-02 NOTE — Progress Notes (Signed)
Pt had 7 beat run v tach. Pt assessed. Pt lying in bed no complaints noted. Vitals obtained. BP 130/48 MAP 71 HR 80. Cardiology notified. Will continue to monitor.

## 2020-01-02 NOTE — TOC Initial Note (Signed)
Transition of Care [facility]) - Initial/Assessment Note    Patient Details  Name: Denise Sharp MRN: [id number] Date of Birth: 1946/04/11  Transition of Care [facility]) CM/SW Contact:    Anselm Pancoast, RN Phone Number: 01/02/2020, 3:34 PM  Clinical Narrative:                 Spoke with patient at bedside for possible home health needs. Patient states she really needs PCS services but understands they are private pay. Requested [facility] for home health  for physical therapy services. Patient states she is anticipating possible cardiac cath with stents on Monday. Patient concerned as her husband has health issues and is not as able to assist as she needs but patient is still requesting to return home with home health. Patient currently has needed DME including ramp. No other needs at this time. Awaiting reply from [facility] for acceptance.         Patient Goals and CMS Choice        Expected Discharge Plan and Services                                                Prior Living Arrangements/Services                       Activities of Daily Living Home Assistive Devices/Equipment: None ADL Screening (condition at time of admission) Patient's cognitive ability adequate to safely complete daily activities?: Yes Is the patient deaf or have difficulty hearing?: No Does the patient have difficulty seeing, even when wearing glasses/contacts?: No Does the patient have difficulty concentrating, remembering, or making decisions?: No Patient able to express need for assistance with ADLs?: Yes Does the patient have difficulty dressing or bathing?: No Independently performs ADLs?: Yes (appropriate for developmental age) Does the patient have difficulty walking or climbing stairs?: No Weakness of Legs: None Weakness of Arms/Hands: None  Permission Sought/Granted                  Emotional Assessment              Admission diagnosis:  Unstable angina ([place])  [I20.0] Angina at rest [facility]) [I20.8] Chest pain, unspecified type [R07.9] Patient Active Problem List   Diagnosis Date Noted  . Angina at rest [facility]) 12/31/2019  . Dysphagia 11/30/2019  . Bad odor of urine 11/30/2019  . Pruritus 11/06/2019  . Abdominal pain 08/23/2019  . Gastritis 08/07/2019  . Advice given about COVID-19 virus by telephone 08/07/2019  . S/P panniculectomy 06/16/2019  . Major depressive disorder with current active episode 05/30/2019  . Exposure to COVID-19 virus 12/25/2018  . Functional diarrhea 10/22/2018  . Preoperative clearance 08/23/2018  . Bilateral cataracts 06/26/2018  . Status post bariatric surgery 02/05/2018  . GAD (generalized anxiety disorder) 02/01/2018  . Rectocele 01/24/2018  . Vaginal prolapse 01/04/2018  . Hepatic steatosis 01/04/2018  . Microalbuminuria due to type 2 diabetes mellitus ([place]) 11/21/2017  . Hospital discharge follow-up 09/22/2017  . Hypocalcemia 09/22/2017  . Fecal incontinence 09/22/2017  . B12 deficiency 09/22/2017  . Rotator cuff arthropathy, right 08/13/2017  . Primary localized osteoarthrosis of shoulder 08/13/2017  . Encounter for preoperative examination for general surgical procedure 08/03/2017  . 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 Candidiasis, intertrigo 08/03/2017  . Venous stasis dermatitis of  right lower extremity 11/21/2016  . Atypical chest pain 10/16/2016  . Edema 12/13/2015  . Hypersomnia, persistent 06/18/2015  . Mechanical breakdown of implanted electronic neurostimulator of peripheral nerve ([place]) 10/16/2014  . Obesity hypoventilation syndrome ([place]) 08/13/2014  . Frequent falls 06/23/2014  . Chronic cough 05/05/2014  . Adenoma of left adrenal gland 03/24/2014  . Syncope and collapse 03/12/2014  . Vitamin D deficiency 01/06/2014  . Pannus, abdominal 12/23/2013  . Weight gain following gastric bypass surgery 12/03/2013  . Morbid obesity ([place]) 08/25/2013  .  Diaphragmatic hernia 04/06/2013  . Diabetic polyneuropathy ([place]) 03/25/2013  . Abnormality of gait 03/25/2013  . Multiple pulmonary nodules 03/20/2013  . Exertional dyspnea 03/20/2013  . Anemia 08/06/2012  . Iron deficiency anemia 08/06/2012  . Functional disorder of bladder 07/15/2012  . Incomplete bladder emptying 07/15/2012  . 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 Urge incontinence 07/15/2012  . Increased frequency of urination 07/15/2012  . OSA on CPAP 03/02/2012  . Insomnia secondary to anxiety 02/29/2012  . Sciatica 09/05/2011  . Cervical stenosis of spinal canal 06/14/2011  . Restless legs syndrome 03/13/2011  . Degenerative disk disease 03/13/2011  . Essential hypertension 03/12/2011  . Diabetes mellitus type II, controlled ([place]) 03/12/2011  . Hearing loss, right 03/12/2011   PCP:  [name], MD Pharmacy:   [id number] [name], [place] [address] Phone: [phone] Fax: [phone]  [place], [place] [address] Phone: [phone] Fax: [phone]     Social Determinants of Health (SDOH) Interventions    Readmission Risk Interventions No flowsheet data found.

## 2020-01-02 NOTE — Progress Notes (Signed)
Progress Note  Patient Name: Denise Sharp Date of Encounter: 01/02/2020  Primary Cardiologist:   [name], MD   Subjective   She had sone chest pain this earlier today.  Currently pain free.  No SOB.  No distress.  Many questions  Inpatient Medications    Scheduled Meds: . aspirin EC  81 mg Oral Daily  . atorvastatin  40 mg Oral Daily  . buPROPion  150 mg Oral q morning - 10a  . buPROPion  75 mg Oral QHS  . clopidogrel  75 mg Oral Daily  . dicyclomine  10 mg Oral TID AC & HS  . enoxaparin (LOVENOX) injection  40 mg Subcutaneous Q24H  . famotidine  20 mg Oral Daily  . insulin aspart  0-20 Units Subcutaneous TID WC  . insulin aspart  0-5 Units Subcutaneous QHS  . insulin glargine  11 Units Subcutaneous Daily  . metoprolol tartrate  25 mg Oral BID  . pantoprazole  40 mg Oral Daily  . sodium chloride flush  3 mL Intravenous Q12H  . sodium chloride flush  3 mL Intravenous Q12H  . umeclidinium-vilanterol  1 puff Inhalation Daily   Continuous Infusions: . sodium chloride     PRN Meds: sodium chloride, acetaminophen, albuterol, alum & mag hydroxide-simeth, diazepam, levETIRAcetam, nitroGLYCERIN, ondansetron (ZOFRAN) IV, ondansetron (ZOFRAN) IV, pregabalin, sodium chloride flush, traMADol, zolpidem   Vital Signs    Vitals:   01/01/20 1544 01/01/20 2039 01/02/20 0525 01/02/20 0801  BP: (!) 92/38 (!) 110/50 128/65 (!) 115/53  Pulse: 71 70 73 68  Resp: 18 16 18    Temp: 100.1 F (37.8 C) 99.1 F (37.3 C) 98.5 F (36.9 C) 98.8 F (37.1 C)  TempSrc: Oral Oral Oral Oral  SpO2: 96% 99% 97% 97%  Weight:   114.6 kg   Height:        Intake/Output Summary (Last 24 hours) at 01/02/2020 0854 Last data filed at 01/01/2020 1000 Gross per 24 hour  Intake 0 ml  Output 0 ml  Net 0 ml   Filed Weights   12/31/19 1702 01/01/20 0500 01/02/20 0525  Weight: 114.3 kg 111.6 kg 114.6 kg    Telemetry    PVCs, PACs, NSR predominantly. - Personally Reviewed  ECG    NA -  Personally Reviewed  Physical Exam   GEN: No acute distress.   Neck: No  JVD Cardiac: RRR, no murmurs, rubs, or gallops.  Respiratory: Clear  to auscultation bilaterally. GI: Soft, nontender, non-distended  MS: No  edema; No deformity.  Right femoral access site without bleeding or brusing Neuro:  Nonfocal  Psych: Normal affect   Labs    Chemistry Recent Labs  Lab 12/30/19 1506  NA 141  K 4.6  CL 107  CO2 23  GLUCOSE 157*  BUN 13  CREATININE 1.06*  CALCIUM 8.7*  GFRNONAA 52*  GFRAA 60*  ANIONGAP 11     Hematology Recent Labs  Lab 12/30/19 1506  WBC 10.2  RBC 4.39  HGB 11.3*  HCT 34.9*  MCV 79.5*  MCH 25.7*  MCHC 32.4  RDW 17.2*  PLT 366    Cardiac EnzymesNo results for input(s): TROPONINI in the last 168 hours. No results for input(s): TROPIPOC in the last 168 hours.   BNPNo results for input(s): BNP, PROBNP in the last 168 hours.   DDimer No results for input(s): DDIMER in the last 168 hours.   Radiology    CARDIAC CATHETERIZATION  Addendum Date: 01/01/2020  Ost LAD to Prox LAD lesion is 65% stenosed.  The left ventricular systolic function is normal.  LV end diastolic pressure is mildly elevated.  The left ventricular ejection fraction is 55-65% by visual estimate.  There is no mitral valve regurgitation.  Mid LAD lesion is 80% stenosed.    Result Date: 12/31/2019  Ost LAD to Prox LAD lesion is 65% stenosed.  The left ventricular systolic function is normal.  LV end diastolic pressure is mildly elevated.  The left ventricular ejection fraction is 55-65% by visual estimate.  There is no mitral valve regurgitation.     Cardiac Studies   CATH:    Diagnostic Dominance: Right     Patient Profile     74 y.o. female with morbid obesity, catheterization 2014 with nonobstructive coronary disease, diabetes type 2, sleep apnea, failure to thrive/weakness, presenting with chest pain/angina.  Cardiac catheterization yesterday 65% ostial LAD  disease, 80% mid LAD disease.     Assessment & Plan    UNSTABLE ANGINA:  Cath as above with plans for PCI on Monday.  I reviewed the films with her and went through a long discussion about the plans for Monday.  I think she has a clear understanding.  Continue current therapy.   HYPOTENSION:   Improved.  Imdur held.     DYSLIPIDEMIA:  LDL is not at target.  She is now on Lipitor.      For questions or updates, please contact [place] Please consult [email or web address] for contact info under Cardiology/STEMI.   Signed, Minus Breeding, MD  01/02/2020, 8:54 AM

## 2020-01-02 NOTE — Hospital Course (Signed)
Note:

## 2020-01-02 NOTE — Progress Notes (Signed)
[place] at [place] NAME: Denise Sharp    MR#:  [id number]  DATE OF BIRTH:  04/03/1946  SUBJECTIVE:  Mentioned about how she got to have BM after 4 days. Feels ok Some mild cp earlier bp stable  REVIEW OF SYSTEMS:   Review of Systems  Constitutional: Negative for chills, fever and weight loss.  HENT: Negative for ear discharge, ear pain and nosebleeds.   Eyes: Negative for blurred vision, pain and discharge.  Respiratory: Negative for sputum production, shortness of breath, wheezing and stridor.   Cardiovascular: Negative for chest pain, palpitations, orthopnea and PND.  Gastrointestinal: Positive for heartburn. Negative for abdominal pain, diarrhea, nausea and vomiting.  Genitourinary: Negative for frequency and urgency.  Musculoskeletal: Negative for back pain and joint pain.  Neurological: Positive for weakness. Negative for sensory change, speech change and focal weakness.  Psychiatric/Behavioral: Negative for depression and hallucinations. The patient is not nervous/anxious.    Tolerating Diet:yes Tolerating PT: HHPT  DRUG ALLERGIES:   Allergies  Allergen Reactions  . Demeclocycline Hives  . Erythromycin Nausea And Vomiting and Other (See Comments)    Severe irritable bowel  . Flagyl [Metronidazole] Nausea And Vomiting and Other (See Comments)    Severe irritable bowel  . Glucophage [Metformin Hcl] Nausea And Vomiting and Other (See Comments)    "Sick" "I won't take anything that has metformin in it"  . Tetracyclines & Related Hives and Rash  . Diovan [Valsartan] Nausea Only       . Sulfa Antibiotics Rash and Other (See Comments)    As child  . Xanax [Alprazolam] Other (See Comments)    Unknown reaction    VITALS:  Blood pressure (!) 122/47, pulse 79, temperature 98 F (36.7 C), temperature source Oral, resp. rate 18, height 5\' 4"  (1.626 m), weight 114.6 kg, SpO2 99 %.  PHYSICAL EXAMINATION:   Physical  Exam  GENERAL:  74 y.o.-year-old patient lying in the bed with no acute distress. Obese EYES: Pupils equal, round, reactive to light and accommodation. No scleral icterus.   HEENT: Head atraumatic, normocephalic. Oropharynx and nasopharynx clear.  NECK:  Supple, no jugular venous distention. No thyroid enlargement, no tenderness.  LUNGS: Normal breath sounds bilaterally, no wheezing, rales, rhonchi. No use of accessory muscles of respiration.  CARDIOVASCULAR: S1, S2 normal. No murmurs, rubs, or gallops.  ABDOMEN: Soft, nontender, nondistended. Bowel sounds present. No organomegaly or mass.  EXTREMITIES: No cyanosis, clubbing or edema b/l.    NEUROLOGIC: Cranial nerves II through XII are intact. No focal Motor or sensory deficits b/l.   PSYCHIATRIC:  patient is alert and oriented x 3.  SKIN: No obvious rash, lesion, or ulcer.   LABORATORY PANEL:  CBC Recent Labs  Lab 12/30/19 1506  WBC 10.2  HGB 11.3*  HCT 34.9*  PLT 366    Chemistries  Recent Labs  Lab 12/30/19 1506  NA 141  K 4.6  CL 107  CO2 23  GLUCOSE 157*  BUN 13  CREATININE 1.06*  CALCIUM 8.7*   Cardiac Enzymes No results for input(s): TROPONINI in the last 168 hours. RADIOLOGY:  No results found. ASSESSMENT AND PLAN:  Denise Sharp is a 74 y.o. female sent to the ED from Dr. Darnelle Bos office for admission.  Patient with a history of chest pain, OSA, type 2 diabetes who has been experiencing waxing/waning chest pain with shortness of breath over the past month.  She was at Dr. Darnelle Bos office today for  an echocardiogram when she noted that her symptoms were worsening.  #1 Unstable angina  -  Cycle enzymes flat   -cardiac cath showed Nonobstructive disease, ostial/proximal LAD 60 to 65% Otherwise no significant disease in other vessels Normal ejection fraction -added imdur  by Dr Tacey Ruiz d/ced due to HA and low bp -Cath with PCI planned for Monday  #2  Essential hypertension: - Continue blood pressure  control.  -Metoprolol  #3 diabetes: Sliding scale insulin with home Lantus 11 units qd  #4 obstructive sleep apnea: CPAP at night  #5 morbid obesity: Dietary counseling  #6 Depression -cont wellbutrin and diazepam  #7 RLS--on keppra as needed  #8 GERD with dysphagia -pt has appt to see dr Allen Norris as out pt -cont PPI and mylanta as needed  DVT prophylaxis: sq heparin Code Status: Full code Family Communication: Husband at bedside Disposition Plan: Home Consults called: Dr. Rockey Situ, cardiology Procedures:pt will need cath with possible PCI on monday Status is: Inpatient  Remains inpatient appropriate because needs repeat cath with possible PCI on monday  Dispo: The patient is from: Home              Anticipated d/c is to: Home              Anticipated d/c date is: likely Monday or Tuesday after cath                TOTAL TIME TAKING CARE OF THIS PATIENT: *25* minutes.  >50% time spent on counselling and coordination of care  Note: This dictation was prepared with Dragon dictation along with smaller phrase technology. Any transcriptional errors that result from this process are unintentional.  Fritzi Mandes M.D    [facility]   CC: Primary care physician; [name], MDPatient ID: Denise Sharp, female   DOB: 03-Aug-1945, 74 y.o.   MRN: [id number]

## 2020-01-03 LAB — GLUCOSE, CAPILLARY
Glucose-Capillary: 173 mg/dL — ABNORMAL HIGH (ref 70–99)
Glucose-Capillary: 196 mg/dL — ABNORMAL HIGH (ref 70–99)
Glucose-Capillary: 211 mg/dL — ABNORMAL HIGH (ref 70–99)
Glucose-Capillary: 85 mg/dL (ref 70–99)

## 2020-01-03 MED ORDER — ASPIRIN 81 MG PO CHEW
81.0000 mg | CHEWABLE_TABLET | ORAL | Status: AC
  Administered 2020-01-04: 81 mg via ORAL
  Filled 2020-01-03: qty 1

## 2020-01-03 MED ORDER — SODIUM CHLORIDE 0.9 % IV SOLN: 250.0000 mL | INTRAVENOUS | Status: DC | PRN

## 2020-01-03 MED ORDER — SODIUM CHLORIDE 0.9 % IV SOLN
INTRAVENOUS | Status: DC
  Administered 2020-01-04: 1 mL via INTRAVENOUS

## 2020-01-03 MED ORDER — SODIUM CHLORIDE 0.9% FLUSH
3.0000 mL | Freq: Two times a day (BID) | INTRAVENOUS | Status: DC
  Administered 2020-01-03 – 2020-01-04 (×2): 3 mL via INTRAVENOUS

## 2020-01-03 MED ORDER — SODIUM CHLORIDE 0.9% FLUSH: 3.0000 mL | INTRAVENOUS | Status: DC | PRN

## 2020-01-03 NOTE — Progress Notes (Signed)
[place] at [place] NAME: Denise Sharp    MR#:  [id number]  DATE OF BIRTH:  [birth year]  SUBJECTIVE:  C/o chronic diarrhea Feels ok bp soft  REVIEW OF SYSTEMS:   Review of Systems  Constitutional: Negative for chills, fever and weight loss.  HENT: Negative for ear discharge, ear pain and nosebleeds.   Eyes: Negative for blurred vision, pain and discharge.  Respiratory: Negative for sputum production, shortness of breath, wheezing and stridor.   Cardiovascular: Negative for chest pain, palpitations, orthopnea and PND.  Gastrointestinal: Positive for heartburn. Negative for abdominal pain, diarrhea, nausea and vomiting.  Genitourinary: Negative for frequency and urgency.  Musculoskeletal: Negative for back pain and joint pain.  Neurological: Positive for weakness. Negative for sensory change, speech change and focal weakness.  Psychiatric/Behavioral: Negative for depression and hallucinations. The patient is not nervous/anxious.    Tolerating Diet:yes Tolerating PT: HHPT  DRUG ALLERGIES:   Allergies  Allergen Reactions  . Demeclocycline Hives  . Erythromycin Nausea And Vomiting and Other (See Comments)    Severe irritable bowel  . Flagyl [Metronidazole] Nausea And Vomiting and Other (See Comments)    Severe irritable bowel  . Glucophage [Metformin Hcl] Nausea And Vomiting and Other (See Comments)    "Sick" "I won't take anything that has metformin in it"  . Tetracyclines & Related Hives and Rash  . Diovan [Valsartan] Nausea Only       . Sulfa Antibiotics Rash and Other (See Comments)    As child  . Xanax [Alprazolam] Other (See Comments)    Unknown reaction    VITALS:  Blood pressure (!) 117/49, pulse 68, temperature 98 F (36.7 C), temperature source Oral, resp. rate 17, height 5\' 4"  (1.626 m), weight 112.9 kg, SpO2 96 %.  PHYSICAL EXAMINATION:   Physical Exam  GENERAL:  74 y.o.-year-old patient lying in the bed with  no acute distress. Obese EYES: Pupils equal, round, reactive to light and accommodation. No scleral icterus.   HEENT: Head atraumatic, normocephalic. Oropharynx and nasopharynx clear.  NECK:  Supple, no jugular venous distention. No thyroid enlargement, no tenderness.  LUNGS: Normal breath sounds bilaterally, no wheezing, rales, rhonchi. No use of accessory muscles of respiration.  CARDIOVASCULAR: S1, S2 normal. No murmurs, rubs, or gallops.  ABDOMEN: Soft, nontender, nondistended. Bowel sounds present. No organomegaly or mass.  EXTREMITIES: No cyanosis, clubbing or edema b/l.    NEUROLOGIC: Cranial nerves II through XII are intact. No focal Motor or sensory deficits b/l.   PSYCHIATRIC:  patient is alert and oriented x 3.  SKIN: No obvious rash, lesion, or ulcer.   LABORATORY PANEL:  CBC Recent Labs  Lab 12/30/19 1506  WBC 10.2  HGB 11.3*  HCT 34.9*  PLT 366    Chemistries  Recent Labs  Lab 12/30/19 1506  NA 141  K 4.6  CL 107  CO2 23  GLUCOSE 157*  BUN 13  CREATININE 1.06*  CALCIUM 8.7*   Cardiac Enzymes No results for input(s): TROPONINI in the last 168 hours. RADIOLOGY:  No results found. ASSESSMENT AND PLAN:  Denise Sharp is a 74 y.o. female sent to the ED from Dr. Darnelle Bos office for admission.  Patient with a history of chest pain, OSA, type 2 diabetes who has been experiencing waxing/waning chest pain with shortness of breath over the past month.  She was at Dr. Darnelle Bos office today for an echocardiogram when she noted that her symptoms were worsening.  #1  Unstable angina  -  Cycle enzymes flat   -cardiac cath showed Nonobstructive disease, ostial/proximal LAD 60 to 65% Otherwise no significant disease in other vessels Normal ejection fraction -added imdur  by Dr Tacey Ruiz d/ced due to HA and low bp -Cath with PCI planned for Monday  #2  Essential hypertension: - Continue blood pressure control.  -Metoprolol  #3 diabetes: Sliding scale insulin with  home Lantus 11 units qd  #4 obstructive sleep apnea: CPAP at night  #5 morbid obesity: Dietary counseling  #6 Depression -cont wellbutrin and diazepam  #7 RLS--on keppra as needed  #8 GERD with dysphagia -pt has appt to see dr Allen Norris as out pt -cont PPI and mylanta as needed  #9 Chronic Diarrhea Cont prn antidiarrheals as before  DVT prophylaxis: sq heparin Code Status: Full code Family Communication: Husband  Disposition Plan: Home Consults called: Dr. Rockey Situ, cardiology Procedures:pt will need cath with possible PCI on monday Status is: Inpatient  Remains inpatient appropriate because needs repeat cath with possible PCI on monday  Dispo: The patient is from: Home              Anticipated d/c is to: Home              Anticipated d/c date is: likely Monday or Tuesday after cath                TOTAL TIME TAKING CARE OF THIS PATIENT: *25* minutes.  >50% time spent on counselling and coordination of care  Note: This dictation was prepared with Dragon dictation along with smaller phrase technology. Any transcriptional errors that result from this process are unintentional.  Fritzi Mandes M.D    [facility]   CC: Primary care physician; [name], MDPatient ID: Denise Sharp, female   DOB: 1946-05-29, 74 y.o.   MRN: [id number]

## 2020-01-04 ENCOUNTER — Other Ambulatory Visit: Payer: Self-pay

## 2020-01-04 ENCOUNTER — Encounter: Payer: Self-pay | Admitting: Cardiovascular Disease

## 2020-01-04 ENCOUNTER — Encounter
Admission: EM | Disposition: A | Discharge status: Home Health | Payer: Self-pay | Source: Home / Self Care | Attending: Internal Medicine

## 2020-01-04 DIAGNOSIS — E785 Hyperlipidemia, unspecified: Secondary | ICD-10-CM

## 2020-01-04 DIAGNOSIS — I959 Hypotension, unspecified: Secondary | ICD-10-CM

## 2020-01-04 HISTORY — PX: CORONARY STENT INTERVENTION: CATH118234

## 2020-01-04 LAB — GLUCOSE, CAPILLARY
Glucose-Capillary: 141 mg/dL — ABNORMAL HIGH (ref 70–99)
Glucose-Capillary: 133 mg/dL — ABNORMAL HIGH (ref 70–99)
Glucose-Capillary: 213 mg/dL — ABNORMAL HIGH (ref 70–99)
Glucose-Capillary: 171 mg/dL — ABNORMAL HIGH (ref 70–99)

## 2020-01-04 LAB — POCT ACTIVATED CLOTTING TIME: Activated Clotting Time: 395 seconds

## 2020-01-04 SURGERY — CORONARY STENT INTERVENTION
Anesthesia: Moderate Sedation

## 2020-01-04 MED ORDER — SODIUM CHLORIDE 0.9% FLUSH
3.0000 mL | Freq: Two times a day (BID) | INTRAVENOUS | Status: DC
  Administered 2020-01-04: 3 mL via INTRAVENOUS

## 2020-01-04 MED ORDER — IOHEXOL 300 MG/ML  SOLN
INTRAMUSCULAR | Status: DC | PRN
  Administered 2020-01-04: 130 mL

## 2020-01-04 MED ORDER — MIDAZOLAM HCL 2 MG/2ML IJ SOLN
INTRAMUSCULAR | Status: DC | PRN
  Administered 2020-01-04: 1 mg via INTRAVENOUS

## 2020-01-04 MED ORDER — NITROGLYCERIN 1 MG/10 ML FOR IR/CATH LAB
INTRA_ARTERIAL | Status: AC
  Filled 2020-01-04: qty 10

## 2020-01-04 MED ORDER — HEPARIN (PORCINE) IN NACL 1000-0.9 UT/500ML-% IV SOLN
INTRAVENOUS | Status: DC | PRN
  Administered 2020-01-04: 500 mL

## 2020-01-04 MED ORDER — FENTANYL CITRATE (PF) 100 MCG/2ML IJ SOLN
INTRAMUSCULAR | Status: DC | PRN
  Administered 2020-01-04: 50 ug via INTRAVENOUS

## 2020-01-04 MED ORDER — CLOPIDOGREL BISULFATE 75 MG PO TABS
ORAL_TABLET | ORAL | Status: DC | PRN
  Administered 2020-01-04: 300 mg via ORAL

## 2020-01-04 MED ORDER — HEPARIN (PORCINE) IN NACL 1000-0.9 UT/500ML-% IV SOLN
INTRAVENOUS | Status: AC
  Filled 2020-01-04: qty 1000

## 2020-01-04 MED ORDER — VERAPAMIL HCL 2.5 MG/ML IV SOLN
INTRAVENOUS | Status: DC | PRN
  Administered 2020-01-04: 2.5 mg via INTRA_ARTERIAL

## 2020-01-04 MED ORDER — ASPIRIN 81 MG PO CHEW
CHEWABLE_TABLET | ORAL | Status: AC
  Filled 2020-01-04: qty 3

## 2020-01-04 MED ORDER — HEPARIN SODIUM (PORCINE) 1000 UNIT/ML IJ SOLN
INTRAMUSCULAR | Status: AC
  Filled 2020-01-04: qty 1

## 2020-01-04 MED ORDER — SODIUM CHLORIDE 0.9% FLUSH: 3.0000 mL | INTRAVENOUS | Status: DC | PRN

## 2020-01-04 MED ORDER — LIDOCAINE HCL (PF) 1 % IJ SOLN
INTRAMUSCULAR | Status: AC
  Filled 2020-01-04: qty 30

## 2020-01-04 MED ORDER — VERAPAMIL HCL 2.5 MG/ML IV SOLN
INTRAVENOUS | Status: AC
  Filled 2020-01-04: qty 2

## 2020-01-04 MED ORDER — MIDAZOLAM HCL 2 MG/2ML IJ SOLN
INTRAMUSCULAR | Status: AC
  Filled 2020-01-04: qty 2

## 2020-01-04 MED ORDER — SODIUM CHLORIDE 0.9 % IV SOLN: 250.0000 mL | INTRAVENOUS | Status: DC | PRN

## 2020-01-04 MED ORDER — FENTANYL CITRATE (PF) 100 MCG/2ML IJ SOLN
INTRAMUSCULAR | Status: AC
  Filled 2020-01-04: qty 2

## 2020-01-04 MED ORDER — ASPIRIN 81 MG PO CHEW
CHEWABLE_TABLET | ORAL | Status: DC | PRN
  Administered 2020-01-04: 243 mg via ORAL

## 2020-01-04 MED ORDER — HEPARIN SODIUM (PORCINE) 1000 UNIT/ML IJ SOLN
INTRAMUSCULAR | Status: DC | PRN
  Administered 2020-01-04: 12000 [IU] via INTRAVENOUS

## 2020-01-04 MED ORDER — LIDOCAINE HCL (PF) 1 % IJ SOLN
INTRAMUSCULAR | Status: DC | PRN
  Administered 2020-01-04: 5 mL

## 2020-01-04 MED ORDER — SODIUM CHLORIDE 0.9 % WEIGHT BASED INFUSION
1.0000 mL/kg/h | INTRAVENOUS | Status: DC
  Administered 2020-01-04: 1 mL/kg/h via INTRAVENOUS

## 2020-01-04 MED ORDER — CLOPIDOGREL BISULFATE 75 MG PO TABS
ORAL_TABLET | ORAL | Status: AC
  Filled 2020-01-04: qty 4

## 2020-01-04 MED ORDER — NITROGLYCERIN 1 MG/10 ML FOR IR/CATH LAB
INTRA_ARTERIAL | Status: DC | PRN
  Administered 2020-01-04: 200 ug via INTRACORONARY

## 2020-01-04 SURGICAL SUPPLY — 19 items
BALLN MINITREK RX 2.0X8 (BALLOONS) ×2
BALLN ~~LOC~~ TREK RX 3.0X8 (BALLOONS) ×2
BALLN ~~LOC~~ TREK RX 3.75X8 (BALLOONS) ×2
BALLOON MINITREK RX 2.0X8 (BALLOONS) IMPLANT
BALLOON ~~LOC~~ TREK RX 3.0X8 (BALLOONS) IMPLANT
BALLOON ~~LOC~~ TREK RX 3.75X8 (BALLOONS) IMPLANT
CATH LAUNCHER 6FR EBU3.5 (CATHETERS) ×1 IMPLANT
DEVICE INFLAT 30 PLUS (MISCELLANEOUS) ×1 IMPLANT
DEVICE RAD TR BAND REGULAR (VASCULAR PRODUCTS) ×1 IMPLANT
GLIDESHEATH SLEND SS 6F .021 (SHEATH) ×1 IMPLANT
GUIDEWIRE INQWIRE 1.5J.035X260 (WIRE) IMPLANT
INQWIRE 1.5J .035X260CM (WIRE) ×2
KIT MANI 3VAL PERCEP (MISCELLANEOUS) ×2 IMPLANT
PACK CARDIAC CATH (CUSTOM PROCEDURE TRAY) ×2 IMPLANT
PROTECTION STATION PRESSURIZED (MISCELLANEOUS)
STATION PROTECTION PRESSURIZED (MISCELLANEOUS) IMPLANT
STENT RESOLUTE ONYX 2.75X12 (Permanent Stent) ×1 IMPLANT
STENT RESOLUTE ONYX 3.5X12 (Permanent Stent) ×1 IMPLANT
WIRE RUNTHROUGH .014X180CM (WIRE) ×1 IMPLANT

## 2020-01-04 NOTE — Care Management Important Message (Signed)
Important Message  Patient Details  Name: ANIDA DEOL MRN: [id number] Date of Birth: Jun 18, 1946   Medicare Important Message Given:  Yes     [name] A Messiah Rovira 01/04/2020, 11:35 AM

## 2020-01-04 NOTE — Progress Notes (Signed)
Dr. Posey Pronto in to see pt. At bedside. No new orders.

## 2020-01-04 NOTE — Progress Notes (Signed)
PT Cancellation Note  Patient Details Name: Denise Sharp MRN: [id number] DOB: 04-13-1946   Cancelled Treatment:    Reason Eval/Treat Not Completed: Other (comment). Pt currently out of room at this time for PCI and stent placement. Not available for therapy at this time. Will re-attempt next date.   [name] 01/04/2020, 10:16 AM  Greggory Stallion, PT, DPT [phone]

## 2020-01-04 NOTE — Progress Notes (Signed)
Progress Note  Patient Name: Denise Sharp Date of Encounter: 01/04/2020  Primary Cardiologist:   [name], MD   Subjective   She underwent PCI and drug-eluting stent placement to both mid LAD and ostial LAD with no immediate complications.  She feels well with no chest pain or shortness of breath.  Inpatient Medications    Scheduled Meds: . [MAR Hold] aspirin EC  81 mg Oral Daily  . [MAR Hold] atorvastatin  40 mg Oral Daily  . [MAR Hold] buPROPion  150 mg Oral q morning - 10a  . [MAR Hold] buPROPion  75 mg Oral QHS  . [MAR Hold] clopidogrel  75 mg Oral Daily  . [MAR Hold] dicyclomine  10 mg Oral TID AC & HS  . [MAR Hold] enoxaparin (LOVENOX) injection  40 mg Subcutaneous Q24H  . [MAR Hold] famotidine  20 mg Oral Daily  . [MAR Hold] insulin aspart  0-20 Units Subcutaneous TID WC  . [MAR Hold] insulin aspart  0-5 Units Subcutaneous QHS  . [MAR Hold] insulin glargine  11 Units Subcutaneous Daily  . [MAR Hold] metoprolol tartrate  25 mg Oral BID  . [MAR Hold] pantoprazole  40 mg Oral Daily  . [MAR Hold] sodium chloride flush  3 mL Intravenous Q12H  . [MAR Hold] sodium chloride flush  3 mL Intravenous Q12H  . [MAR Hold] sodium chloride flush  3 mL Intravenous Q12H  . [MAR Hold] umeclidinium-vilanterol  1 puff Inhalation Daily   Continuous Infusions: . [MAR Hold] sodium chloride Stopped (01/04/20 0500)  . sodium chloride    . sodium chloride 100 mL/hr at 01/04/20 0919   PRN Meds: [MAR Hold] sodium chloride, sodium chloride, [MAR Hold] acetaminophen, [MAR Hold] albuterol, [MAR Hold] alum & mag hydroxide-simeth, [MAR Hold] diazepam, [MAR Hold] levETIRAcetam, [MAR Hold] nitroGLYCERIN, [MAR Hold] ondansetron (ZOFRAN) IV, [MAR Hold] ondansetron (ZOFRAN) IV, [MAR Hold] pregabalin, [MAR Hold] sodium chloride flush, sodium chloride flush, [MAR Hold] traMADol, [MAR Hold] zolpidem   Vital Signs    Vitals:   01/04/20 0545 01/04/20 0755 01/04/20 0835 01/04/20 0945  BP: (!)  109/48 (!) 109/41  (!) 102/50  Pulse: 65 60  (!) 58  Resp: 20 20  (!) 23  Temp: 97.9 F (36.6 C) 98.5 F (36.9 C)    TempSrc: Oral Oral    SpO2: 100% 100% 100% 97%  Weight:  112.9 kg    Height:  5\' 4"  (1.626 m)      Intake/Output Summary (Last 24 hours) at 01/04/2020 0951 Last data filed at 01/04/2020 0500 Gross per 24 hour  Intake 540 ml  Output 0 ml  Net 540 ml   Filed Weights   01/02/20 0525 01/03/20 0348 01/04/20 0755  Weight: 114.6 kg 112.9 kg 112.9 kg    Telemetry    Normal sinus rhythm. - Personally Reviewed  ECG    NA - Personally Reviewed  Physical Exam   GEN: No acute distress.   Neck: No  JVD Cardiac: RRR, no murmurs, rubs, or gallops.  Respiratory: Clear  to auscultation bilaterally. GI: Soft, nontender, non-distended  MS: No  edema; No deformity.  Right femoral access site without bleeding or brusing.  TR band on the right radial artery Neuro:  Nonfocal  Psych: Normal affect   Labs    Chemistry Recent Labs  Lab 12/30/19 1506  NA 141  K 4.6  CL 107  CO2 23  GLUCOSE 157*  BUN 13  CREATININE 1.06*  CALCIUM 8.7*  GFRNONAA 52*  GFRAA 60*  ANIONGAP 11     Hematology Recent Labs  Lab 12/30/19 1506  WBC 10.2  RBC 4.39  HGB 11.3*  HCT 34.9*  MCV 79.5*  MCH 25.7*  MCHC 32.4  RDW 17.2*  PLT 366    Cardiac EnzymesNo results for input(s): TROPONINI in the last 168 hours. No results for input(s): TROPIPOC in the last 168 hours.   BNPNo results for input(s): BNP, PROBNP in the last 168 hours.   DDimer No results for input(s): DDIMER in the last 168 hours.   Radiology    CARDIAC CATHETERIZATION  Result Date: 01/04/2020  Ost LAD to Prox LAD lesion is 80% stenosed.  Post intervention, there is a 0% residual stenosis.  Post intervention, there is a 0% residual stenosis.  A drug-eluting stent was successfully placed using a STENT RESOLUTE ONYX 3.5X12.  Mid LAD lesion is 80% stenosed.  A drug-eluting stent was successfully placed  using a [place] [id number].  Successful PCI and drug-eluting stent placement to mid as well as ostial LAD. Recommendations: Dual antiplatelet therapy for at least 6 months but should consider long-term dual antiplatelet therapy given that the ostial LAD stent extends a millimeter into the left main coronary artery Aggressive treatment of risk factors. Likely discharge home tomorrow if no issues.    Cardiac Studies   CATH:    Diagnostic Dominance: Right     Patient Profile     74 y.o. female with morbid obesity, catheterization 2014 with nonobstructive coronary disease, diabetes type 2, sleep apnea, failure to thrive/weakness, presenting with chest pain/angina.  Cardiac catheterization yesterday 65% ostial LAD disease, 80% mid LAD disease.     Assessment & Plan    UNSTABLE ANGINA: Status post PCI and drug-eluting stent placement to mid LAD as well as ostial LAD.  The ostial LAD lesion appeared significant after intracoronary nitroglycerin was given.  The procedure was done via the right radial artery.  Continue dual antiplatelet therapy for at least 6 months but preferably long-term given that the ostial LAD stent extends 1 or 2 mm into the left main coronary artery.  Likely discharge home tomorrow.  Continue aggressive treatment of risk factors.  She was referred to cardiac rehab.    HYPOTENSION:   Improved.  Imdur held.     DYSLIPIDEMIA:  LDL is not at target.  She is now on Lipitor.      For questions or updates, please contact [place] Please consult [email or web address] for contact info under Cardiology/STEMI.   Signed, Kathlyn Sacramento, MD  01/04/2020, 9:51 AM

## 2020-01-04 NOTE — Progress Notes (Signed)
[facility] [place] NAME: Denise Sharp    MR#:  [id number]  DATE OF BIRTH:  [birth year]  SUBJECTIVE:  patient seen in specials recovery. She feels better. Status post cardiac cath this morning REVIEW OF SYSTEMS:   Review of Systems  Constitutional: Negative for chills, fever and weight loss.  HENT: Negative for ear discharge, ear pain and nosebleeds.   Eyes: Negative for blurred vision, pain and discharge.  Respiratory: Negative for sputum production, shortness of breath, wheezing and stridor.   Cardiovascular: Negative for chest pain, palpitations, orthopnea and PND.  Gastrointestinal: Positive for heartburn. Negative for abdominal pain, diarrhea, nausea and vomiting.  Genitourinary: Negative for frequency and urgency.  Musculoskeletal: Negative for back pain and joint pain.  Neurological: Positive for weakness. Negative for sensory change, speech change and focal weakness.  Psychiatric/Behavioral: Negative for depression and hallucinations. The patient is not nervous/anxious.    Tolerating Diet:yes Tolerating PT: HHPT  DRUG ALLERGIES:   Allergies  Allergen Reactions  . Demeclocycline Hives  . Erythromycin Nausea And Vomiting and Other (See Comments)    Severe irritable bowel  . Flagyl [Metronidazole] Nausea And Vomiting and Other (See Comments)    Severe irritable bowel  . Glucophage [Metformin Hcl] Nausea And Vomiting and Other (See Comments)    "Sick" "I won't take anything that has metformin in it"  . Tetracyclines & Related Hives and Rash  . Diovan [Valsartan] Nausea Only       . Sulfa Antibiotics Rash and Other (See Comments)    As child  . Xanax [Alprazolam] Other (See Comments)    Unknown reaction    VITALS:  Blood pressure (!) 128/52, pulse (!) 59, temperature 98.6 F (37 C), temperature source Oral, resp. rate 11, height 5\' 4"  (1.626 m), weight 112.9 kg, SpO2 98 %.  PHYSICAL EXAMINATION:   Physical  Exam  GENERAL:  74 y.o.-year-old patient lying in the bed with no acute distress. Obese EYES: Pupils equal, round, reactive to light and accommodation. No scleral icterus.   HEENT: Head atraumatic, normocephalic. Oropharynx and nasopharynx clear.  NECK:  Supple, no jugular venous distention. No thyroid enlargement, no tenderness.  LUNGS: Normal breath sounds bilaterally, no wheezing, rales, rhonchi. No use of accessory muscles of respiration.  CARDIOVASCULAR: S1, S2 normal. No murmurs, rubs, or gallops.  ABDOMEN: Soft, nontender, nondistended. Bowel sounds present. No organomegaly or mass.  EXTREMITIES: No cyanosis, clubbing or edema b/l.    NEUROLOGIC: Cranial nerves II through XII are intact. No focal Motor or sensory deficits b/l.   PSYCHIATRIC:  patient is alert and oriented x 3.  SKIN: No obvious rash, lesion, or ulcer.   LABORATORY PANEL:  CBC Recent Labs  Lab 12/30/19 1506  WBC 10.2  HGB 11.3*  HCT 34.9*  PLT 366    Chemistries  Recent Labs  Lab 12/30/19 1506  NA 141  K 4.6  CL 107  CO2 23  GLUCOSE 157*  BUN 13  CREATININE 1.06*  CALCIUM 8.7*   Cardiac Enzymes No results for input(s): TROPONINI in the last 168 hours. RADIOLOGY:  CARDIAC CATHETERIZATION  Result Date: 01/04/2020  Ost LAD to Prox LAD lesion is 80% stenosed.  Post intervention, there is a 0% residual stenosis.  Post intervention, there is a 0% residual stenosis.  A drug-eluting stent was successfully placed using a STENT RESOLUTE ONYX 3.5X12.  Mid LAD lesion is 80% stenosed.  A drug-eluting stent was successfully placed using a STENT RESOLUTE ONYX  1.03U13.  Successful PCI and drug-eluting stent placement to mid as well as ostial LAD. Recommendations: Dual antiplatelet therapy for at least 6 months but should consider long-term dual antiplatelet therapy given that the ostial LAD stent extends a millimeter into the left main coronary artery Aggressive treatment of risk factors. Likely discharge home  tomorrow if no issues.   ASSESSMENT AND PLAN:  Denise Sharp is a 74 y.o. female sent to the ED from Dr. Darnelle Bos office for admission.  Patient with a history of chest pain, OSA, type 2 diabetes who has been experiencing waxing/waning chest pain with shortness of breath over the past month.  She was at Dr. Darnelle Bos office today for an echocardiogram when she noted that her symptoms were worsening.  #1 Unstable angina  -  Cycle enzymes flat   -6/17>>cardiac cath showed Nonobstructive disease, ostial/proximal LAD 60 to 65% Otherwise no significant disease in other vessels Normal ejection fraction -added imdur  by Dr Tacey Ruiz d/ced due to HA and low bp 6/21>>Cath with PCI and drug-eluting stent placement to both mid LAD and ostial LAD -cont asa+plavix  #2  Essential hypertension: - Continue blood pressure control.  -Metoprolol  #3 diabetes: Sliding scale insulin with home Lantus 11 units qd  #4 obstructive sleep apnea: CPAP at night  #5 morbid obesity: Dietary counseling  #6 Depression -cont wellbutrin and diazepam  #7 RLS--on keppra as needed  #8 GERD with dysphagia -pt has appt to see dr Allen Norris as out pt -cont PPI and mylanta as needed  #9 Chronic Diarrhea Cont prn antidiarrheals as before  DVT prophylaxis: sq heparin Code Status: Full code Family Communication: Husband  Disposition Plan: Home Consults called: Dr. Rockey Situ, cardiology Procedures: status post cath with stent placement times two. Status is: Inpatient  Remains inpatient appropriate because needs to be observed one more night post PCI with stent placement. Dispo: The patient is from: Home              Anticipated d/c is to: Home              Anticipated d/c date is: likely Tuesday               TOTAL TIME TAKING CARE OF THIS PATIENT: *25* minutes.  >50% time spent on counselling and coordination of care  Note: This dictation was prepared with Dragon dictation along with smaller phrase technology. Any  transcriptional errors that result from this process are unintentional.  Fritzi Mandes M.D    [facility]   CC: Primary care physician; [name], MDPatient ID: Denise Sharp, female   DOB: 1946/07/13, 74 y.o.   MRN: [id number]

## 2020-01-04 NOTE — Interval H&P Note (Signed)
History and Physical Interval Note:  01/04/2020 8:36 AM Cath Lab Visit (complete for each Cath Lab visit)  Clinical Evaluation Leading to the Procedure:   ACS: Yes.   Unstable angina  Non-ACS:   N/a       Denise Sharp  has presented today for surgery, with the diagnosis of unstable angina.  The various methods of treatment have been discussed with the patient and family. After consideration of risks, benefits and other options for treatment, the patient has consented to  Procedure(s): LEFT HEART CATH AND CORONARY ANGIOGRAPHY (N/A) as a surgical intervention.  The patient's history has been reviewed, patient examined, no change in status, stable for surgery.  I have reviewed the patient's chart and labs.  Questions were answered to the patient's satisfaction.     Kathlyn [place]

## 2020-01-05 ENCOUNTER — Other Ambulatory Visit: Payer: Self-pay

## 2020-01-05 ENCOUNTER — Telehealth: Payer: Self-pay | Admitting: Internal Medicine

## 2020-01-05 LAB — CBC
HCT: 32.5 % — ABNORMAL LOW (ref 36.0–46.0)
Hemoglobin: 10 g/dL — ABNORMAL LOW (ref 12.0–15.0)
MCH: 24.8 pg — ABNORMAL LOW (ref 26.0–34.0)
MCHC: 30.8 g/dL (ref 30.0–36.0)
MCV: 80.4 fL (ref 80.0–100.0)
Platelets: 310 10*3/uL (ref 150–400)
RBC: 4.04 MIL/uL (ref 3.87–5.11)
RDW: 17 % — ABNORMAL HIGH (ref 11.5–15.5)
WBC: 9.7 10*3/uL (ref 4.0–10.5)
nRBC: 0 % (ref 0.0–0.2)

## 2020-01-05 LAB — BASIC METABOLIC PANEL
Anion gap: 8 (ref 5–15)
BUN: 14 mg/dL (ref 8–23)
CO2: 25 mmol/L (ref 22–32)
Calcium: 8.3 mg/dL — ABNORMAL LOW (ref 8.9–10.3)
Chloride: 107 mmol/L (ref 98–111)
Creatinine, Ser: 1.2 mg/dL — ABNORMAL HIGH (ref 0.44–1.00)
GFR calc Af Amer: 52 mL/min — ABNORMAL LOW (ref >60)
GFR calc non Af Amer: 44 mL/min — ABNORMAL LOW (ref >60)
Glucose, Bld: 146 mg/dL — ABNORMAL HIGH (ref 70–99)
Potassium: 4.1 mmol/L (ref 3.5–5.1)
Sodium: 140 mmol/L (ref 135–145)

## 2020-01-05 LAB — GLUCOSE, CAPILLARY
Glucose-Capillary: 148 mg/dL — ABNORMAL HIGH (ref 70–99)
Glucose-Capillary: 151 mg/dL — ABNORMAL HIGH (ref 70–99)

## 2020-01-05 MED ORDER — NITROGLYCERIN 0.4 MG SL SUBL: 0.4000 mg | SUBLINGUAL_TABLET | SUBLINGUAL | 1 refills | Status: DC | PRN

## 2020-01-05 MED ORDER — CLOPIDOGREL BISULFATE 75 MG PO TABS: 75.0000 mg | ORAL_TABLET | Freq: Every day | ORAL | 0 refills | Status: DC

## 2020-01-05 MED ORDER — ATORVASTATIN CALCIUM 40 MG PO TABS: 40.0000 mg | ORAL_TABLET | Freq: Every day | ORAL | 0 refills | Status: DC

## 2020-01-05 MED ORDER — ASPIRIN EC 81 MG PO TBEC: 81.0000 mg | DELAYED_RELEASE_TABLET | Freq: Every day | ORAL | 2 refills | Status: DC

## 2020-01-05 NOTE — Telephone Encounter (Signed)
Currently admitted. Will need to call tomorrow, 01/05/20.

## 2020-01-05 NOTE — Telephone Encounter (Signed)
-----   Message from Rise Mu, PA-C sent at 01/05/2020  7:42 AM EDT ----- Can you please schedule her for TCM in 7-14 days. Thanks!

## 2020-01-05 NOTE — Progress Notes (Signed)
Physical Therapy Treatment Patient Details Name: Denise Sharp MRN: [id number] DOB: [birth year] Today's Date: 01/05/2020    History of Present Illness Denise Sharp is a 74 y.o. female with medical history significant of morbid obesity, obstructive sleep apnea, hypertension, diabetes, degenerative disc disease, neuropathy, previous syncopal episode, normocephalic coronary artery disease who was seen by cardiology today for routine evaluation and was found to have evidence of cardiac decompensation.    PT Comments    Pt was long sitting in bed upon arriving. She agrees to PT with encouragement. Once motivated, pt was cooperative throughout. Pt is very talkative and easily distracted over was able to perform all task therapist requested. She was able to exit R side of bed without assistance, stood with supervision to RW, and ambulated 200 ft without LOB. She does require +1 standing rest but vitals were stable throughout. She returned to room and was repositioned in recliner. Therapist spent several minutes discussing importance of continued ambulation/exercises when DC home. Pt is agreeable to HHPT. Acute PT will continue to follow per POC. She was seated in recliner with spouse at bedside at conclusion of session.     Follow Up Recommendations  Home health PT;Supervision for mobility/OOB     Equipment Recommendations  None recommended by PT    Recommendations for Other Services       Precautions / Restrictions Precautions Precautions: Fall Restrictions Weight Bearing Restrictions: No    Mobility  Bed Mobility Overal bed mobility: Modified Independent                Transfers Overall transfer level: Needs assistance Equipment used: Rolling walker (2 wheeled) Transfers: Sit to/from Stand Sit to Stand: Supervision            Ambulation/Gait Ambulation/Gait assistance: Supervision Gait Distance (Feet): 200 Feet Assistive device: Rolling walker (2  wheeled) Gait Pattern/deviations: Step-through pattern;Trunk flexed Gait velocity: WNL   General Gait Details: pt ambulated 200 ft with only +1 standing rest. Vitals were stable throughout.   Stairs             Wheelchair Mobility    Modified Rankin (Stroke Patients Only)       Balance Overall balance assessment: Needs assistance Sitting-balance support: Feet supported Sitting balance-Leahy Scale: Good Sitting balance - Comments: no LOB in sitting   Standing balance support: Bilateral upper extremity supported;During functional activity Standing balance-Leahy Scale: Fair Standing balance comment: reliant on RW for stability                            Cognition Arousal/Alertness: Awake/alert Behavior During Therapy: WFL for tasks assessed/performed Overall Cognitive Status: Within Functional Limits for tasks assessed                                 General Comments: very talkative, difficult to keep on task      Exercises Other Exercises Other Exercises: therapist educated pt on importance of continued ambulation and exercise to promote strengthening. She is agreeable to further PT with HHPT ordered at [place] Comments        Pertinent Vitals/Pain Pain Assessment: No/denies pain Pain Intervention(s): Limited activity within patient's tolerance;Monitored during session;Premedicated before session    Home Living                      Prior Function  PT Goals (current goals can now be found in the care plan section) Acute Rehab PT Goals Patient Stated Goal: to return home Progress towards PT goals: Progressing toward goals    Frequency    Min 2X/week      PT Plan Current plan remains appropriate    Co-evaluation              AM-PAC PT "6 Clicks" Mobility   Outcome Measure  Help needed turning from your back to your side while in a flat bed without using bedrails?: A Little Help needed  moving from lying on your back to sitting on the side of a flat bed without using bedrails?: A Little Help needed moving to and from a bed to a chair (including a wheelchair)?: A Little Help needed standing up from a chair using your arms (e.g., wheelchair or bedside chair)?: A Little Help needed to walk in hospital room?: A Little Help needed climbing 3-5 steps with a railing? : A Lot 6 Click Score: 17    End of Session Equipment Utilized During Treatment: Gait belt Activity Tolerance: Patient tolerated treatment well Patient left: in chair;with call bell/phone within reach;with chair alarm set;with family/visitor present Nurse Communication: Mobility status PT Visit Diagnosis: Unsteadiness on feet (R26.81);History of falling (Z91.81);Muscle weakness (generalized) (M62.81)     Time: 2446-9507 PT Time Calculation (min) (ACUTE ONLY): 25 min  Charges:  $Gait Training: 8-22 mins $Therapeutic Activity: 8-22 mins                     Julaine Fusi PTA 01/05/20, 11:59 AM

## 2020-01-05 NOTE — Telephone Encounter (Signed)
Noted  

## 2020-01-05 NOTE — Telephone Encounter (Signed)
TCM....  Patient is being discharged   They saw Gollan/Arida  They are scheduled to see END on 6/30  They were seen for chest pain  They need to be seen within 7-10 days  Pt not placed wait list   Please call

## 2020-01-05 NOTE — Discharge Summary (Signed)
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 Abnormality of gait 03/25/2013  . Adrenal mass, left ([place])   . Anemia    iron deficient post  2 unit txfsn 2009, normal endo/colonoscopy by [facility]  . Arthritis   . Atypical chest pain    a. 04/1986 Cath ([facility]): nl cors, EF 65%; b. 07/2012 Cath [facility]): LM nl, LAD/LCX/RCA w/ min irregs; c. 07/2016 MV [facility]): "Equivocal" w/ small inf wall rev defect and hyperdynamic LV contraction, EF 87%; d. 08/2016 Cardiac CT Ca2+ score [facility]): Ca2+ score 1548; e. 05/2019 MV: No ischemia. Diaph atten. EF 75%.  . Cervical spinal stenosis 1994   due to trauma to back ([employer] accident), has intermittent paralysis and parasthesias  . Cervicogenic headache 03/23/2014  . Depression   . Diastolic dysfunction    a. 12/2019 Echo: EF 65-70%, Gr2 DD. No significant valvular dzs.  . Dizziness    chronic dizziness  . DJD (degenerative joint disease)    a. Chronic R shoulder pain pending R shoulder replacement 07/2017.  [name] Esophageal stenosis March 2011   with transietn outlet obstruction by food, cleared by EGD   . Family history of adverse reaction to anesthesia    daughter PONV  . Gastric bypass status for obesity   . Headache(784.0)   . Hypertension   . IBS (irritable bowel syndrome)   . Left bundle branch block (LBBB)    a. Intermittently present - likely rate related. - patient denies  . Obesity   . Obstructive sleep apnea    using  CPAP  . 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 Restless leg syndrome   . Rotator cuff arthropathy, right 08/13/2017  . Syncope and collapse 03/12/2014  . Type II diabetes mellitus [facility])     HOSPITAL COURSE:   Denise Loflin Pattersonis a 74 y.o.femalesent to the ED from Denise Sharp'soffice for admission. Patient with a history of chest pain, OSA, type 2 diabetes who has been experiencing waxing/waning chest pain with shortness of breath over the past month. She was at Denise. Darnelle Sharp Sharp today for an echocardiogram when she noted that her symptoms were worsening.  #1 CAD invovlving native coronaries with Unstable angina  - Cycled enzymes flat  -6/17>>cardiac cath showed Nonobstructive disease, ostial/proximal LAD 60 to 65% Otherwise no significant disease in other vessels Normal ejection fraction -added imdur  by Denise Sharp d/ced due to HA and low bp 6/21>>Cath with PCI and drug-eluting stent placement to both mid LAD and ostial LAD -cont asa+plavix -held BB due to low DBP--per Denise Sharp  #2 Essential hypertension: - Continue blood pressure control.  -Metoprolol--holding at discharge--can consider starting as outpt  #3 diabetes:Sliding scale insulin with home Lantus 11 units qd  #4 obstructive sleep apnea:CPAP at night  #5 morbid obesity:Dietary counseling  #6 Depression -cont wellbutrin and diazepam  #7 RLS--on keppra as needed  #8 GERD with dysphagia -pt has appt to see Denise Allen Norris as out pt -cont PPI  and mylanta as needed  #9 Chronic Diarrhea Cont prn antidiarrheals as before  DVT prophylaxis: sq heparin Code Status:Full code Family Communication:Husband  Disposition Plan:Home Consults called:Denise Sharp/Denise Sharp/Sharp--cardiology Procedures: status post cath with stent placement times two. Status is: Inpatient  Remains inpatient appropriate because needs to be observed one more night post PCI with stent placement. Dispo: The patient is from:  Home  Anticipated d/c is to: Home  Anticipated d/c date is: today  CONSULTS OBTAINED:  Treatment Team:  Denise Bush, MD  DRUG ALLERGIES:   Allergies  Allergen Reactions  . Demeclocycline Hives  . Erythromycin Nausea And Vomiting and Other (See Comments)    Severe irritable bowel  . Flagyl [Metronidazole] Nausea And Vomiting and Other (See Comments)    Severe irritable bowel  . Glucophage [Metformin Hcl] Nausea And Vomiting and Other (See Comments)    "Sick" "I won't take anything that has metformin in it"  . Tetracyclines & Related Hives and Rash  . Diovan [Valsartan] Nausea Only       . Sulfa Antibiotics Rash and Other (See Comments)    As child  . Xanax [Alprazolam] Other (See Comments)    Unknown reaction    DISCHARGE MEDICATIONS:   Allergies as of 01/05/2020      Reactions   Demeclocycline Hives   Erythromycin Nausea And Vomiting, Other (See Comments)   Severe irritable bowel   Flagyl [metronidazole] Nausea And Vomiting, Other (See Comments)   Severe irritable bowel   Glucophage [metformin Hcl] Nausea And Vomiting, Other (See Comments)   "Sick" "I won't take anything that has metformin in it"   Tetracyclines & Related Hives, Rash   Diovan [valsartan] Nausea Only       Sulfa Antibiotics Rash, Other (See Comments)   As child   Xanax [alprazolam] Other (See Comments)   Unknown reaction      Medication List    TAKE these medications   acetaminophen 650 MG CR tablet Commonly known as: TYLENOL Take 650 mg by mouth every 8 (eight) hours as needed for pain.   albuterol 108 (90 Base) MCG/ACT inhaler Commonly known as: VENTOLIN HFA Inhale 2 puffs into the lungs every 8 (eight) hours as needed for wheezing or shortness of breath.   Anoro Ellipta 62.5-25 MCG/INH Aepb Generic drug: umeclidinium-vilanterol Inhale 1 puff into the lungs daily.   aspirin EC 81 MG tablet Take 1 tablet (81 mg total) by mouth daily.   atorvastatin  40 MG tablet Commonly known as: LIPITOR Take 1 tablet (40 mg total) by mouth daily. Start taking on: January 06, 2020   B-D UF III MINI PEN NEEDLES 31G X 5 MM Misc Generic drug: Insulin Pen Needle USE TO INJECT INSULIN DAILY   benzonatate 200 MG capsule Commonly known as: TESSALON Take 1 capsule (200 mg total) by mouth 2 (two) times daily as needed for cough.   blood glucose meter kit and supplies Use to check sugars up to three times daily. Dx code: E11.9   buPROPion 150 MG 24 hr tablet Commonly known as: Wellbutrin XL Take 1 tablet (150 mg total) by mouth daily. What changed: when to take this   buPROPion 75 MG tablet Commonly known as: WELLBUTRIN TAKE 1-2 TABLETS (75-150 MG TOTAL) BY MOUTH SEE ADMIN INSTRUCTIONS. TAKE 2 TABLETS IN THE MORNING AND 1 TABLET IN THE AFTERNOON What changed: See the new instructions.   butalbital-acetaminophen-caffeine 50-325-40 MG tablet Commonly known as: FIORICET Take 1 tablet by mouth every 6 (six) hours as needed  for headache.   clopidogrel 75 MG tablet Commonly known as: PLAVIX Take 1 tablet (75 mg total) by mouth daily. Start taking on: January 06, 2020   clotrimazole-betamethasone cream Commonly known as: Lotrisone Apply 1 application topically 2 (two) times daily. What changed:   when to take this  reasons to take this   cyanocobalamin 1000 MCG/ML injection Commonly known as: (VITAMIN B-12) Inject 1 ml (1000 mcg ) IM weekly x 4,  Then monthly thereafter What changed:   how much to take  how to take this  when to take this  additional instructions   diazepam 5 MG tablet Commonly known as: VALIUM Take 1 tablet (5 mg total) by mouth at bedtime as needed for anxiety.   dicyclomine 10 MG capsule Commonly known as: BENTYL Take 1 capsule (10 mg total) by mouth 4 (four) times daily -  before meals and at bedtime.   diphenhydrAMINE 25 MG tablet Commonly known as: BENADRYL Take 50 mg by mouth daily as needed for allergies.    diphenoxylate-atropine 2.5-0.025 MG tablet Commonly known as: Lomotil Take 1 tablet by mouth 4 (four) times daily as needed for diarrhea or loose stools.   Eszopiclone 3 MG Tabs Take 1 tablet (3 mg total) by mouth at bedtime.   FreeStyle Libre 14 Day Reader Kerrin Mo 1 applicator by Does not apply route 4 (four) times daily.   FreeStyle Libre 14 Day Sensor Misc Use to test blood sugar 4 times daily   ipratropium 0.06 % nasal spray Commonly known as: ATROVENT Place 2 sprays into both nostrils 4 (four) times daily.   Lantus SoloStar 100 UNIT/ML Solostar Pen Generic drug: insulin glargine Inject 11 Units into the skin daily. What changed: how much to take   levETIRAcetam 750 MG tablet Commonly known as: KEPPRA Take 1 tablet (750 mg total) by mouth at bedtime. What changed:   when to take this  reasons to take this   nitroGLYCERIN 0.4 MG SL tablet Commonly known as: NITROSTAT Place 1 tablet (0.4 mg total) under the tongue every 5 (five) minutes as needed for chest pain.   NONFORMULARY OR COMPOUNDED [place]  Combination Pain Cream -  Baclofen 2%, Doxepin 5%, Gabapentin 6%, Topiramate 2%, Pentoxifylline 3% Apply 1-2 grams to affected area 3-4 times daily Qty. 120 gm 3 refills   Nyamyc powder Generic drug: nystatin Apply 1 g topically 4 (four) times daily as needed (rash).   ondansetron 4 MG disintegrating tablet Commonly known as: Zofran ODT Take 1 tablet (4 mg total) by mouth every 8 (eight) hours as needed for nausea or vomiting.   ONE TOUCH DELICA LANCING DEV Misc Use to test blood glucose once daily. Dx code = E11.9   OneTouch Verio w/Device Kit 1 Device by Does not apply route daily. Use to test blood glucose once daily. Dx code = E11.9   OVER THE COUNTER MEDICATION Apply 1 application topically daily as needed (pain). [place] Gell   pregabalin 100 MG capsule Commonly known as: LYRICA Take 100 mg by mouth at bedtime as needed (leg cramps).    SYRINGE 3CC/25GX1" 25G X 1" 3 ML Misc Use for b12 injections   traMADol 50 MG tablet Commonly known as: ULTRAM Take 0.5 tablets (25 mg total) by mouth every 12 (twelve) hours as needed for severe pain.       If you experience worsening of your admission symptoms, develop shortness of breath, life threatening emergency, suicidal or homicidal thoughts you must seek medical attention  immediately by calling 911 or calling your MD immediately  if symptoms less severe.  You Must read complete instructions/literature along with all the possible adverse reactions/side effects for all the Medicines you take and that have been prescribed to you. Take any new Medicines after you have completely understood and accept all the possible adverse reactions/side effects.   Please note  You were cared for by a hospitalist during your hospital stay. If you have any questions about your discharge medications or the care you received while you were in the hospital after you are discharged, you can call the unit and asked to speak with the hospitalist on call if the hospitalist that took care of you is not available. Once you are discharged, your primary care physician will handle any further medical issues. Please note that NO REFILLS for any discharge medications will be authorized once you are discharged, as it is imperative that you return to your primary care physician (or establish a relationship with a primary care physician if you do not have one) for your aftercare needs so that they can reassess your need for medications and monitor your lab values. Today   SUBJECTIVE   Doing overall ok No cp  VITAL SIGNS:  Blood pressure (!) 121/54, pulse 68, temperature 97.8 F (36.6 C), temperature source Oral, resp. rate 17, height _0  (1.626 m), weight 111.9 kg, SpO2 96 %.  I/O:    Intake/Output Summary (Last 24 hours) at 01/05/2020 1406 Last data filed at 01/05/2020 1342 Gross per 24 hour  Intake 1037.4  ml  Output 800 ml  Net 237.4 ml    PHYSICAL EXAMINATION:  GENERAL:  74 y.o.-year-old patient lying in the bed with no acute distress. Obese EYES: Pupils equal, round, reactive to light and accommodation. No scleral icterus.  HEENT: Head atraumatic, normocephalic. Oropharynx and nasopharynx clear.  NECK:  Supple, no jugular venous distention. No thyroid enlargement, no tenderness.  LUNGS: Normal breath sounds bilaterally, no wheezing, rales,rhonchi or crepitation. No use of accessory muscles of respiration.  CARDIOVASCULAR: S1, S2 normal. No murmurs, rubs, or gallops.  ABDOMEN: Soft, non-tender, non-distended. Bowel sounds present. No organomegaly or mass.  EXTREMITIES: No pedal edema, cyanosis, or clubbing.  NEUROLOGIC: Cranial nerves II through XII are intact. Muscle strength 5/5 in all extremities. Sensation intact. Gait not checked.  PSYCHIATRIC: The patient is alert and oriented x 3.  SKIN: No obvious rash, lesion, or ulcer.   DATA REVIEW:   CBC  Recent Labs  Lab 01/05/20 0656  WBC 9.7  HGB 10.0*  HCT 32.5*  PLT 310    Chemistries  Recent Labs  Lab 01/05/20 0656  NA 140  K 4.1  CL 107  CO2 25  GLUCOSE 146*  BUN 14  CREATININE 1.20*  CALCIUM 8.3*    Microbiology Results   Recent Results (from the past 240 hour(s))  SARS Coronavirus 2 by RT PCR (hospital order, performed in [facility] hospital lab) Nasopharyngeal Nasopharyngeal Swab     Status: None   Collection Time: 12/30/19 11:36 PM   Specimen: Nasopharyngeal Swab  Result Value Ref Range Status   SARS Coronavirus 2 NEGATIVE NEGATIVE Final    Comment: (NOTE) SARS-CoV-2 target nucleic acids are NOT DETECTED.  The SARS-CoV-2 RNA is generally detectable in upper and lower respiratory specimens during the acute phase of infection. The lowest concentration of SARS-CoV-2 viral copies this assay can detect is 250 copies / mL. A negative result does not preclude SARS-CoV-2 infection and should not  be used as  the sole basis for treatment or other patient management decisions.  A negative result may occur with improper specimen collection / handling, submission of specimen other than nasopharyngeal swab, presence of viral mutation(s) within the areas targeted by this assay, and inadequate number of viral copies (<250 copies / mL). A negative result must be combined with clinical observations, patient history, and epidemiological information.  Fact Sheet for Patients:   StrictlyIdeas.no  Fact Sheet for Healthcare Providers: BankingDealers.co.za  This test is not yet approved or  cleared by the [place] FDA and has been authorized for detection and/or diagnosis of SARS-CoV-2 by FDA under an Emergency Use Authorization (EUA).  This EUA will remain in effect (meaning this test can be used) for the duration of the COVID-19 declaration under Section 564(b)(1) of the Act, 21 U.S.C. section 360bbb-3(b)(1), unless the authorization is terminated or revoked sooner.  Performed at [facility], [address] [place], [place] [address]     RADIOLOGY:  CARDIAC CATHETERIZATION  Result Date: 01/04/2020  Ost LAD to Prox LAD lesion is 80% stenosed.  Post intervention, there is a 0% residual stenosis.  Post intervention, there is a 0% residual stenosis.  A drug-eluting stent was successfully placed using a STENT RESOLUTE ONYX 3.5X12.  Mid LAD lesion is 80% stenosed.  A drug-eluting stent was successfully placed using a [place] [id number].  Successful PCI and drug-eluting stent placement to mid as well as ostial LAD. Recommendations: Dual antiplatelet therapy for at least 6 months but should consider long-term dual antiplatelet therapy given that the ostial LAD stent extends a millimeter into the left main coronary artery Aggressive treatment of risk factors. Likely discharge home tomorrow if no issues.     CODE STATUS:     Code  Status Orders  (From admission, onward)         Start     Ordered   12/31/19 0105  Full code  Continuous        12/31/19 0104        Code Status History    Date Active Date Inactive Code Status Order ID Comments User Context   06/16/2019 1508 06/18/2019 1911 Full Code [id number]  [name], MD Inpatient   08/20/2017 0007 08/22/2017 0026 Full Code [id number]  Damita Lack, MD ED   08/13/2017 1900 08/16/2017 2113 Full Code [id number]  [name], MD Inpatient   Advance Care Planning Activity    Advance Directive Documentation     Most Recent Value  Type of Advance Directive Healthcare Power of Attorney, Living will  Pre-existing out of facility DNR order (yellow form or pink MOST form) --  "MOST" Form in Place? --       TOTAL TIME TAKING CARE OF THIS PATIENT: *40* minutes.    Fritzi Mandes M.D  [facility]    CC: Primary care physician; Denise Mc, MD

## 2020-01-05 NOTE — Progress Notes (Signed)
Patient alert and oriented, vss, no complaints of pain.  D/c telemetry and piv.  Escorted out of hospital via wheelchair by volunteers.

## 2020-01-05 NOTE — Progress Notes (Signed)
Inpatient Diabetes Program Recommendations  AACE/ADA: New Consensus Statement on Inpatient Glycemic Control (2015)  Target Ranges:  Prepandial:   less than 140 mg/dL      Peak postprandial:   less than 180 mg/dL (1-2 hours)      Critically ill patients:  140 - 180 mg/dL   Lab Results  Component Value Date   GLUCAP 148 (H) 01/05/2020   HGBA1C 7.7 (H) 12/31/2019    Review of Glycemic Control Results for Denise Sharp, Denise Sharp (MRN [id number]) as of 01/05/2020 11:10  Ref. Range 01/04/2020 08:05 01/04/2020 09:57 01/04/2020 15:43 01/04/2020 20:55 01/05/2020 07:17  Glucose-Capillary Latest Ref Range: 70 - 99 mg/dL 141 (H) 133 (H) 213 (H) 171 (H) 148 (H)   Diabetes history: DM 2 Outpatient Diabetes medications:  Lantus 10-15 units daily,  Current orders for Inpatient glycemic control:  Lantus 11 units daily, Novolog resistant tid with meals and HS  Inpatient Diabetes Program Recommendations:    Referral received regarding patient having questions regarding insulin regimen.   Will see patient today to answer questions.   Thanks,  Adah Perl, RN, BC-ADM Inpatient Diabetes Coordinator Pager [phone]   Addendum:  [name] to patient regarding hospital diabetes medications.  She is concerned regarding the amount of insulin she is getting and the amount of times that blood sugars are being checked.  Explained that often blood sugars are checked more frequently in the hospital and the need for good control of blood sugars in the hospital.  Patient was appreciative of information.  Explained that she would likely go back on her home medications.

## 2020-01-05 NOTE — Telephone Encounter (Signed)
[facility] called and scheduled a HFU appt for 6/30 for pt

## 2020-01-05 NOTE — Plan of Care (Signed)
  Problem: Education: Goal: Understanding of CV disease, CV risk reduction, and recovery process will improve Outcome: Progressing   Problem: Activity: Goal: Ability to return to baseline activity level will improve Outcome: Progressing   Problem: Cardiovascular: Goal: Vascular access site(s) Level 0-1 will be maintained Outcome: Completed/Met

## 2020-01-05 NOTE — Progress Notes (Signed)
Progress Note  Patient Name: Denise Sharp Date of Encounter: 01/05/2020  Primary Cardiologist: End  Subjective   Status post PCI/DEs to the ostial and mid LAD without complications. She feels well this morning and is without chest pain, dyspnea, or palpitations. She has not yet ambulated. Post cath labs are pending. She has had some issues with constipation followed by diarrhea this admission with a history of IBS. BP soft.   Inpatient Medications    Scheduled Meds: . aspirin EC  81 mg Oral Daily  . atorvastatin  40 mg Oral Daily  . buPROPion  150 mg Oral q morning - 10a  . buPROPion  75 mg Oral QHS  . clopidogrel  75 mg Oral Daily  . dicyclomine  10 mg Oral TID AC & HS  . enoxaparin (LOVENOX) injection  40 mg Subcutaneous Q24H  . famotidine  20 mg Oral Daily  . insulin aspart  0-20 Units Subcutaneous TID WC  . insulin aspart  0-5 Units Subcutaneous QHS  . insulin glargine  11 Units Subcutaneous Daily  . pantoprazole  40 mg Oral Daily  . umeclidinium-vilanterol  1 puff Inhalation Daily   Continuous Infusions: . sodium chloride Stopped (01/04/20 0500)  . sodium chloride     PRN Meds: sodium chloride, sodium chloride, acetaminophen, albuterol, alum & mag hydroxide-simeth, diazepam, levETIRAcetam, nitroGLYCERIN, ondansetron (ZOFRAN) IV, ondansetron (ZOFRAN) IV, pregabalin, sodium chloride flush, traMADol, zolpidem   Vital Signs    Vitals:   01/04/20 1541 01/04/20 2053 01/05/20 0551 01/05/20 0717  BP: (!) 106/50 (!) 111/37 (!) 113/36 (!) 101/37  Pulse: 64 74 63 61  Resp: 17 17 16 17   Temp: 98.4 F (36.9 C) 98.8 F (37.1 C) 97.8 F (36.6 C) 97.9 F (36.6 C)  TempSrc: Oral Oral  Oral  SpO2: 95% 97% 96% 98%  Weight:   111.9 kg   Height:        Intake/Output Summary (Last 24 hours) at 01/05/2020 0731 Last data filed at 01/05/2020 0717 Gross per 24 hour  Intake 677.4 ml  Output 501 ml  Net 176.4 ml   Filed Weights   01/03/20 0348 01/04/20 0755 01/05/20 0551   Weight: 112.9 kg 112.9 kg 111.9 kg    Telemetry    SR with rare PVCs - Personally Reviewed  ECG    NSR, 61 bpm, no acute st/t changes - Personally Reviewed  Physical Exam   GEN: No acute distress.   Neck: No JVD. Cardiac: RRR, no murmurs, rubs, or gallops. Right radial cardiac cath site is without bleeding, swelling, bruising, erythema, or TTP. Radial pulse 2+. Right femoral cardiac cath site is well healed without bleeding, bruising, swelling, warmth, erythema, or TTP. No bruit.  Respiratory: Clear to auscultation bilaterally.  GI: Soft, nontender, non-distended.   MS: No edema; No deformity. Neuro:  Alert and oriented x 3; Nonfocal.  Psych: Normal affect.  Labs    Chemistry Recent Labs  Lab 12/30/19 1506  NA 141  K 4.6  CL 107  CO2 23  GLUCOSE 157*  BUN 13  CREATININE 1.06*  CALCIUM 8.7*  GFRNONAA 52*  GFRAA 60*  ANIONGAP 11     Hematology Recent Labs  Lab 12/30/19 1506  WBC 10.2  RBC 4.39  HGB 11.3*  HCT 34.9*  MCV 79.5*  MCH 25.7*  MCHC 32.4  RDW 17.2*  PLT 366    Cardiac EnzymesNo results for input(s): TROPONINI in the last 168 hours. No results for input(s): TROPIPOC in the  last 168 hours.   BNPNo results for input(s): BNP, PROBNP in the last 168 hours.   DDimer No results for input(s): DDIMER in the last 168 hours.   Radiology    CXR 12/30/2019: IMPRESSION: Mild bibasilar atelectasis with likely scarring in the lingula. No other acute cardiopulmonary disease.  Cardiac Studies   2D echo 12/30/2019: 1. Left ventricular ejection fraction, by estimation, is 65 to 70%. The  left ventricle has normal function. The left ventricle has no regional  wall motion abnormalities. Left ventricular diastolic parameters are  consistent with Grade II diastolic  dysfunction (pseudonormalization).  2. Right ventricular systolic function is normal. The right ventricular  size is normal.  3. The mitral valve is normal in structure. No evidence of  mitral valve  regurgitation. No evidence of mitral stenosis.  4. The aortic valve is normal in structure. Aortic valve regurgitation is  not visualized. No aortic stenosis is present. _______________  [facility] 12/31/2019: Coronary angiography:  Coronary dominance: Right  Left mainstem:   Large vessel that bifurcates into the LAD and left circumflex, no significant disease noted  Left anterior descending (LAD):   Large vessel that extends to the apical region, diagonal branch 2 of moderate size, 65% ostial/proximal LAD disease/regions of haziness, 80% mid LAD disease  Left circumflex (LCx):  Large vessel with OM branch 2, no significant disease noted  Right coronary artery (RCA):  Right dominant vessel with PL and PDA, no significant disease noted  Left ventriculography: Left ventricular systolic function is normal, LVEF is estimated at 55-65%, there is no significant mitral regurgitation , no significant aortic valve stenosis  Final Conclusions:   Moderate ostial/proximal LAD disease Severe mid LAD disease Normal ejection fraction  Recommendations:  Difficult decision whether to proceed with aggressive intervention Recent negative stress test, symptoms with some typical and atypical features She is relatively immobile, she does report some dysphagia symptoms, unclear if she is appreciating more GI symptoms Recommend starting isosorbide 30 mg daily Images discussed with Dr. Saunders Revel -We will need to consider PCI of the mid LAD, consider FFR of ostial LAD if she continues to have anginal symptoms.  Would recommend this be done at [facility], consider radial access given right groin access very difficult _______________  [facility] 01/04/2020:  Ost LAD to Prox LAD lesion is 80% stenosed.  Post intervention, there is a 0% residual stenosis.  Post intervention, there is a 0% residual stenosis.  A drug-eluting stent was successfully placed using a STENT RESOLUTE ONYX 3.5X12.  Mid LAD lesion is  80% stenosed.  A drug-eluting stent was successfully placed using a [place] [id number].   Successful PCI and drug-eluting stent placement to mid as well as ostial LAD.  Recommendations: Dual antiplatelet therapy for at least 6 months but should consider long-term dual antiplatelet therapy given that the ostial LAD stent extends a millimeter into the left main coronary artery Aggressive treatment of risk factors. Likely discharge home tomorrow if no issues.    Patient Profile     74 y.o. female with history of DM2, obesity, HLD, and generalized weakness admitted with unstable angina.   Assessment & Plan    1. 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 HLD: -LDL 113 this admission  -She is now on Lipitor -Recheck FLP and LFT in ~ 8 weeks, if LDL remains above goal at that time add Zetia  3. Hypotension: -Likely in the setting of some volume depletion with diarrhea and sedentary period during admission -Imdur previously held -Metoprolol discontinued today as above -Ambulate  4. IBS: -Stable  For questions or updates, please contact [place] Please consult [email or web address] for contact info under Cardiology/STEMI.    Signed, Christell Faith, PA-C [place] Pager: [phone] 01/05/2020, 7:31 AM

## 2020-01-06 ENCOUNTER — Other Ambulatory Visit: Payer: Self-pay | Admitting: Internal Medicine

## 2020-01-06 ENCOUNTER — Telehealth: Payer: Self-pay

## 2020-01-06 DIAGNOSIS — R55 Syncope and collapse: Secondary | ICD-10-CM | POA: Diagnosis not present

## 2020-01-06 DIAGNOSIS — Z955 Presence of coronary angioplasty implant and graft: Secondary | ICD-10-CM | POA: Diagnosis not present

## 2020-01-06 DIAGNOSIS — G2581 Restless legs syndrome: Secondary | ICD-10-CM | POA: Diagnosis not present

## 2020-01-06 DIAGNOSIS — F329 Major depressive disorder, single episode, unspecified: Secondary | ICD-10-CM | POA: Diagnosis not present

## 2020-01-06 DIAGNOSIS — E1140 Type 2 diabetes mellitus with diabetic neuropathy, unspecified: Secondary | ICD-10-CM | POA: Diagnosis not present

## 2020-01-06 DIAGNOSIS — K222 Esophageal obstruction: Secondary | ICD-10-CM | POA: Diagnosis not present

## 2020-01-06 DIAGNOSIS — M6281 Muscle weakness (generalized): Secondary | ICD-10-CM | POA: Diagnosis not present

## 2020-01-06 DIAGNOSIS — M5030 Other cervical disc degeneration, unspecified cervical region: Secondary | ICD-10-CM | POA: Diagnosis not present

## 2020-01-06 DIAGNOSIS — I447 Left bundle-branch block, unspecified: Secondary | ICD-10-CM | POA: Diagnosis not present

## 2020-01-06 DIAGNOSIS — Z48812 Encounter for surgical aftercare following surgery on the circulatory system: Secondary | ICD-10-CM | POA: Diagnosis not present

## 2020-01-06 DIAGNOSIS — G4733 Obstructive sleep apnea (adult) (pediatric): Secondary | ICD-10-CM | POA: Diagnosis not present

## 2020-01-06 DIAGNOSIS — Z794 Long term (current) use of insulin: Secondary | ICD-10-CM | POA: Diagnosis not present

## 2020-01-06 DIAGNOSIS — I25119 Atherosclerotic heart disease of native coronary artery with unspecified angina pectoris: Secondary | ICD-10-CM | POA: Diagnosis not present

## 2020-01-06 DIAGNOSIS — R1310 Dysphagia, unspecified: Secondary | ICD-10-CM | POA: Diagnosis not present

## 2020-01-06 DIAGNOSIS — I119 Hypertensive heart disease without heart failure: Secondary | ICD-10-CM | POA: Diagnosis not present

## 2020-01-06 NOTE — Telephone Encounter (Signed)
Transition Care Management Follow-up Telephone Call  Date of discharge and from where: 01/05/20 from [facility]  How have you been since you were released from the hospital? Patient stated, "I am tired, weak, eating/drinking without issues although my appetite has decreased some." Resting well with cpap.  PT coming today. Denies diarrhea, nausea, vomiting, chest pain and all other symptoms.  Any questions or concerns? Patient states," I would like to discuss a different medication to replace the atorvastatin due to history of leg pain."  Items Reviewed:  Did the pt receive and understand the discharge instructions provided? Increase activity as tolerated.  Medications obtained and verified? Patient declined. Noted she was aware of the changes and the hospital was extremely thorough with explaining. Patient intends to discuss medications in office with PMD. Encouraged patient to call office back if any questions or concerns.   Any new allergies since your discharge? None.  Dietary orders reviewed? Yes, low sodium/heart healthy  Do you have support at home? Yes, husband  Functional Questionnaire: (I = Independent and D = Dependent) ADLs: husband assist as needed  Bathing/Dressing- husband assist  Meal Prep- husband assist  Eating- i  Maintaining continence- i  Transferring/Ambulation- walker, cane  Managing Meds- self; pill box in use  Follow up appointments reviewed:   PCP Hospital f/u appt confirmed? Scheduled to see [name] on 01/13/20 @ 11:00.  [facility] f/u appt confirmed? Scheduled to see Cardiology on 01/13/20 @ 3:20.  Are transportation arrangements needed? No  If their condition worsens, is the pt aware to call PCP or go to the Emergency Dept.? Yes  Was the patient provided with contact information for the PCP's office or ED? Yes  Was to pt encouraged to call back with questions or concerns? Yes

## 2020-01-07 ENCOUNTER — Telehealth: Payer: Self-pay | Admitting: Internal Medicine

## 2020-01-07 NOTE — Telephone Encounter (Signed)
Spoke with patient. 01/04/20 STENT placed. Patient on Plavix and aspirin.  Yesterday, patient felt congested and had to blow her nose. The tissue showed a scab that came out yesterday. This morning she blew her nose twice and bright red blood was noted on the tissue. No other scabs or clots. No blood running out of her nose or down her throat. Denies blood in spit, urine, or stool (no bright red or black).  She remembers wearing the nasal cannula in the hospital and her nose felt dry and irritated with the oxygen. Advised patient to continue Plavix and aspirin, continue to monitor for signs of bleeding. Advised her to avoid blowing her nose as much as possible. She will let us know if this continues or any new symptoms develop.  Patient's next appointment is 01/13/20 with Dr End. Routing to Dr End for review.

## 2020-01-07 NOTE — Telephone Encounter (Signed)
This sounds like self-limited epistaxis, potentially related to recent nasal cannula use in the hospital.  She should continue her current medications, including dual antiplatelet therapy.  If she has further epistaxis or other bleeding, she should let us know.  Antiplatelet therapy should NOT be stopped without cardiology involvement, even if recommended by another non-cardiology provider, given PCI earlier this week.  [name], MD [facility]

## 2020-01-07 NOTE — Telephone Encounter (Signed)
Patient contacted regarding discharge from [facility] on 01/05/20.  Patient understands to follow up with provider Dr End on 01/13/20 at 3:20 pm at [facility]. Patient understands discharge instructions? yes Patient understands medications and regiment? yes Patient understands to bring all medications to this visit? yes Ask patient:  Are you enrolled in My Chart (yes

## 2020-01-07 NOTE — Telephone Encounter (Signed)
No answer. Left message to call back.   

## 2020-01-07 NOTE — Telephone Encounter (Signed)
Patient had stents placed 6/21 and went home 6/22. Patient was told to call office if nose started to bleed. Patient woke up this morning "congested" blew her nose and there was bright red blood, this happened twice. Patient did not notice any clots, there was a scab yesterday.  Please advise

## 2020-01-12 DIAGNOSIS — I25119 Atherosclerotic heart disease of native coronary artery with unspecified angina pectoris: Secondary | ICD-10-CM | POA: Diagnosis not present

## 2020-01-12 DIAGNOSIS — I447 Left bundle-branch block, unspecified: Secondary | ICD-10-CM | POA: Diagnosis not present

## 2020-01-12 DIAGNOSIS — Z48812 Encounter for surgical aftercare following surgery on the circulatory system: Secondary | ICD-10-CM | POA: Diagnosis not present

## 2020-01-12 DIAGNOSIS — I119 Hypertensive heart disease without heart failure: Secondary | ICD-10-CM | POA: Diagnosis not present

## 2020-01-12 DIAGNOSIS — E1140 Type 2 diabetes mellitus with diabetic neuropathy, unspecified: Secondary | ICD-10-CM | POA: Diagnosis not present

## 2020-01-12 DIAGNOSIS — M6281 Muscle weakness (generalized): Secondary | ICD-10-CM | POA: Diagnosis not present

## 2020-01-13 ENCOUNTER — Ambulatory Visit (INDEPENDENT_AMBULATORY_CARE_PROVIDER_SITE_OTHER): Payer: Medicare Other | Admitting: Internal Medicine

## 2020-01-13 ENCOUNTER — Other Ambulatory Visit: Payer: Self-pay

## 2020-01-13 ENCOUNTER — Encounter: Payer: Self-pay | Admitting: Internal Medicine

## 2020-01-13 VITALS — BP 124/70 | HR 80 | Ht 64.5 in | Wt 243.5 lb

## 2020-01-13 VITALS — BP 130/82 | HR 98 | Temp 98.2°F | Ht 64.02 in | Wt 242.8 lb

## 2020-01-13 DIAGNOSIS — R8290 Unspecified abnormal findings in urine: Secondary | ICD-10-CM

## 2020-01-13 DIAGNOSIS — Z794 Long term (current) use of insulin: Secondary | ICD-10-CM

## 2020-01-13 DIAGNOSIS — E1142 Type 2 diabetes mellitus with diabetic polyneuropathy: Secondary | ICD-10-CM | POA: Diagnosis not present

## 2020-01-13 DIAGNOSIS — I5032 Chronic diastolic (congestive) heart failure: Secondary | ICD-10-CM

## 2020-01-13 DIAGNOSIS — I10 Essential (primary) hypertension: Secondary | ICD-10-CM

## 2020-01-13 DIAGNOSIS — E785 Hyperlipidemia, unspecified: Secondary | ICD-10-CM

## 2020-01-13 DIAGNOSIS — I9581 Postprocedural hypotension: Secondary | ICD-10-CM | POA: Diagnosis not present

## 2020-01-13 DIAGNOSIS — I25110 Atherosclerotic heart disease of native coronary artery with unstable angina pectoris: Secondary | ICD-10-CM | POA: Diagnosis not present

## 2020-01-13 DIAGNOSIS — I2510 Atherosclerotic heart disease of native coronary artery without angina pectoris: Secondary | ICD-10-CM

## 2020-01-13 DIAGNOSIS — I2584 Coronary atherosclerosis due to calcified coronary lesion: Secondary | ICD-10-CM | POA: Diagnosis not present

## 2020-01-13 DIAGNOSIS — R944 Abnormal results of kidney function studies: Secondary | ICD-10-CM

## 2020-01-13 DIAGNOSIS — I25118 Atherosclerotic heart disease of native coronary artery with other forms of angina pectoris: Secondary | ICD-10-CM | POA: Insufficient documentation

## 2020-01-13 DIAGNOSIS — E1159 Type 2 diabetes mellitus with other circulatory complications: Secondary | ICD-10-CM

## 2020-01-13 DIAGNOSIS — R059 Cough, unspecified: Secondary | ICD-10-CM

## 2020-01-13 DIAGNOSIS — Z9889 Other specified postprocedural states: Secondary | ICD-10-CM

## 2020-01-13 DIAGNOSIS — Z7902 Long term (current) use of antithrombotics/antiplatelets: Secondary | ICD-10-CM | POA: Diagnosis not present

## 2020-01-13 DIAGNOSIS — R05 Cough: Secondary | ICD-10-CM | POA: Diagnosis not present

## 2020-01-13 LAB — BASIC METABOLIC PANEL
BUN: 22 mg/dL (ref 6–23)
CO2: 20 mEq/L (ref 19–32)
Calcium: 9.1 mg/dL (ref 8.4–10.5)
Chloride: 104 mEq/L (ref 96–112)
Creatinine, Ser: 1.22 mg/dL — ABNORMAL HIGH (ref 0.40–1.20)
GFR: 43.05 mL/min — ABNORMAL LOW (ref >60.00)
Glucose, Bld: 163 mg/dL — ABNORMAL HIGH (ref 70–99)
Potassium: 4.7 mEq/L (ref 3.5–5.1)
Sodium: 138 mEq/L (ref 135–145)

## 2020-01-13 LAB — CBC WITH DIFFERENTIAL/PLATELET
Basophils Absolute: 0.1 10*3/uL (ref 0.0–0.1)
Basophils Relative: 0.7 % (ref 0.0–3.0)
Eosinophils Absolute: 0.1 10*3/uL (ref 0.0–0.7)
Eosinophils Relative: 1.1 % (ref 0.0–5.0)
HCT: 35.4 % — ABNORMAL LOW (ref 36.0–46.0)
Hemoglobin: 11.1 g/dL — ABNORMAL LOW (ref 12.0–15.0)
Lymphocytes Relative: 13.9 % (ref 12.0–46.0)
Lymphs Abs: 1.7 10*3/uL (ref 0.7–4.0)
MCHC: 31.3 g/dL (ref 30.0–36.0)
MCV: 80.7 fl (ref 78.0–100.0)
Monocytes Absolute: 0.6 10*3/uL (ref 0.1–1.0)
Monocytes Relative: 4.8 % (ref 3.0–12.0)
Neutro Abs: 9.8 10*3/uL — ABNORMAL HIGH (ref 1.4–7.7)
Neutrophils Relative %: 79.5 % — ABNORMAL HIGH (ref 43.0–77.0)
Platelets: 380 10*3/uL (ref 150.0–400.0)
RBC: 4.39 Mil/uL (ref 3.87–5.11)
RDW: 17.2 % — ABNORMAL HIGH (ref 11.5–15.5)
WBC: 12.4 10*3/uL — ABNORMAL HIGH (ref 4.0–10.5)

## 2020-01-13 MED ORDER — BENZONATATE 200 MG PO CAPS: 200.0000 mg | ORAL_CAPSULE | Freq: Two times a day (BID) | ORAL | 5 refills | Status: DC | PRN

## 2020-01-13 NOTE — Patient Instructions (Addendum)
Check a 2 hr post prandial blood sugar reading every day ( pick a different meal  Each day)  Continue the fasting sugar daily as well  Talk with dr End about the metallic taste in your mouth    Continue with the diet your daughter is cooking for you,  Just eat small amounts   I think you should continue citalopram (celexa) and wellbutrin   Do not resume the KETO supplements  I do recommend the cardio pulmonary rehab program at [facility] to build your stamina back up

## 2020-01-13 NOTE — Assessment & Plan Note (Signed)
S/p PCI/stent of 2 lesions in LAD January 05 2020 .  Continue asa and plavix.  . Imdur stopped due to hypotension and headache  Prn ntg

## 2020-01-13 NOTE — Patient Instructions (Signed)
I will send a message to Cardiac Rehab letting them know you are ready to proceed. If you do not hear anything in the next week, then please call [phone].  Medication Instructions:  Your physician recommends that you continue on your current medications as directed. Please refer to the Current Medication list given to you today.  *If you need a refill on your cardiac medications before your next appointment, please call your pharmacy*   Follow-Up: At [facility], you and your health needs are our priority.  As part of our continuing mission to provide you with exceptional heart care, we have created designated Provider Care Teams.  These Care Teams include your primary Cardiologist (physician) and Advanced Practice Providers (APPs -  Physician Assistants and Nurse Practitioners) who all work together to provide you with the care you need, when you need it.  We recommend signing up for the patient portal called "MyChart".  Sign up information is provided on this After Visit Summary.  MyChart is used to connect with patients for Virtual Visits (Telemedicine).  Patients are able to view lab/test results, encounter notes, upcoming appointments, etc.  Non-urgent messages can be sent to your provider as well.   To learn more about what you can do with MyChart, go to [email or web address].    Your next appointment:   1 month(s)  The format for your next appointment:   In Person  Provider:    You may see [name], MD or one of the following Advanced Practice Providers on your designated Care Team:    Murray Hodgkins, NP  Christell Faith, PA-C  Marrianne Mood, PA-C

## 2020-01-13 NOTE — Assessment & Plan Note (Signed)
Etiology unclear.  Normotensive currently

## 2020-01-13 NOTE — Assessment & Plan Note (Signed)
Loss of control noted with a1c rise from 7.2 to 7.7  .  Taking Lantus 12 units daily.  Advised to check PP and fasting and for medication adjustment

## 2020-01-13 NOTE — Progress Notes (Signed)
Subjective:  Patient ID: Denise Sharp, female    DOB: 1946-01-28  Age: 74 y.o. MRN: [id number]  CC: The primary encounter diagnosis was Encounter for current long term use of antiplatelet drug. Diagnoses of Cough, Decreased GFR, Bad odor of urine, Controlled type 2 diabetes mellitus with diabetic polyneuropathy, with long-term current use of insulin ([place]), Coronary artery disease due to calcified coronary lesion, S/P cardiac catheterization, and Postprocedural hypotension were also pertinent to this visit.  HPI [name] presents for hospital follow up.     This visit occurred during the SARS-CoV-2 public health emergency.  Safety protocols were in place, including screening questions prior to the visit, additional usage of staff PPE, and extensive cleaning of exam room while observing appropriate contact time as indicated for disinfecting solutions.   She was admitted to [facility] on June 16 with recurrent chest pain/dyspnea .  Underwent a diagnostic cardiac cath,  Followed by a PCI with stents placed in the ostial and mid LAD  For 65% stenosis . PCI with DES x 2  On June 21  Via Right brachial artery . New meds: atorvastatin and plavix. Developed hypotension,  imdur and metoprolol suspended. BP 90/30  On June 22, day of  discharge.  No history of hypertension ,   home readings have been 100/40 since discharge   Has had a metallic taste in mouth since discharge ,  Started during hospitalization . Thinks it's one of the new medications  Tyep 2 DM: A1c rose from 7.2 in November to 7.7  .  Fasting sugars since discharge have been around 150 .  No lows .  Taking lantus 15 units, dose adjusted  For blood sugar.  Getting home PT since discharge   2 visits thus far   Feels tired,  denies chest pain. . Sees Dr End this afternoon .  Has lost weight on daughter's modified KETO diet   Lab Results  Component Value Date   CREATININE 1.20 (H) 01/05/2020   Lab Results  Component Value Date    NA 140 01/05/2020   K 4.1 01/05/2020   CL 107 01/05/2020   CO2 25 01/05/2020   Lab Results  Component Value Date   HGBA1C 7.7 (H) 12/31/2019     Outpatient Medications Prior to Visit  Medication Sig Dispense Refill  . acetaminophen (TYLENOL) 650 MG CR tablet Take 650 mg by mouth every 8 (eight) hours as needed for pain.    [name] albuterol (VENTOLIN HFA) 108 (90 Base) MCG/ACT inhaler Inhale 2 puffs into the lungs every 8 (eight) hours as needed for wheezing or shortness of breath. 8 g 2  . aspirin EC 81 MG tablet Take 1 tablet (81 mg total) by mouth daily. 30 tablet 2  . atorvastatin (LIPITOR) 40 MG tablet Take 1 tablet (40 mg total) by mouth daily. 30 tablet 0  . blood glucose meter kit and supplies Use to check sugars up to three times daily. Dx code: E11.9 1 each 0  . Blood Glucose Monitoring Suppl (ONETOUCH VERIO) w/Device KIT 1 Device by Does not apply route daily. Use to test blood glucose once daily. Dx code = E11.9 1 kit 0  . buPROPion (WELLBUTRIN XL) 150 MG 24 hr tablet Take 1 tablet (150 mg total) by mouth daily. (Patient taking differently: Take 150 mg by mouth every morning. ) 90 tablet 1  . buPROPion (WELLBUTRIN) 75 MG tablet TAKE 1-2 TABLETS (75-150 MG TOTAL) BY MOUTH SEE ADMIN INSTRUCTIONS. TAKE 2 TABLETS  IN THE MORNING AND 1 TABLET IN THE AFTERNOON (Patient taking differently: 75 mg at bedtime. ) 270 tablet 1  . butalbital-acetaminophen-caffeine (FIORICET) 50-325-40 MG tablet Take 1 tablet by mouth every 6 (six) hours as needed for headache. 90 tablet 5  . clopidogrel (PLAVIX) 75 MG tablet Take 1 tablet (75 mg total) by mouth daily. 30 tablet 0  . clotrimazole-betamethasone (LOTRISONE) cream Apply 1 application topically 2 (two) times daily. (Patient taking differently: Apply 1 application topically 2 (two) times daily as needed (rash). ) 45 g 3  . Continuous Blood Gluc Receiver (FREESTYLE LIBRE 14 DAY READER) DEVI 1 applicator by Does not apply route 4 (four) times daily. 1 each 6   . Continuous Blood Gluc Sensor (FREESTYLE LIBRE 14 DAY SENSOR) MISC Use to test blood sugar 4 times daily 1 each 6  . cyanocobalamin (,VITAMIN B-12,) 1000 MCG/ML injection Inject 1 ml (1000 mcg ) IM weekly x 4,  Then monthly thereafter (Patient taking differently: Inject 1,000 mcg into the muscle every 30 (thirty) days. On the 12th day) 110 mL 0  . diazepam (VALIUM) 5 MG tablet Take 1 tablet (5 mg total) by mouth at bedtime as needed for anxiety. 30 tablet 5  . dicyclomine (BENTYL) 10 MG capsule Take 1 capsule (10 mg total) by mouth 4 (four) times daily -  before meals and at bedtime. 360 capsule 3  . diphenhydrAMINE (BENADRYL) 25 MG tablet Take 50 mg by mouth daily as needed for allergies.    . diphenoxylate-atropine (LOMOTIL) 2.5-0.025 MG tablet Take 1 tablet by mouth 4 (four) times daily as needed for diarrhea or loose stools. 60 tablet 0  . Eszopiclone 3 MG TABS Take 1 tablet (3 mg total) by mouth at bedtime. 90 tablet 1  . Insulin Glargine (LANTUS SOLOSTAR) 100 UNIT/ML Solostar Pen Inject 11 Units into the skin daily. (Patient taking differently: Inject 10-15 Units into the skin daily. ) 5 pen 3  . Insulin Pen Needle (B-D UF III MINI PEN NEEDLES) 31G X 5 MM MISC USE TO INJECT INSULIN DAILY 100 each 3  . ipratropium (ATROVENT) 0.06 % nasal spray Place 2 sprays into both nostrils 4 (four) times daily. 15 mL 12  . Lancet Devices (ONE TOUCH DELICA LANCING DEV) MISC Use to test blood glucose once daily. Dx code = E11.9 1 each 0  . levETIRAcetam (KEPPRA) 750 MG tablet Take 1 tablet (750 mg total) by mouth at bedtime. (Patient taking differently: Take 750 mg by mouth at bedtime as needed (restless legs). ) 90 tablet 3  . nitroGLYCERIN (NITROSTAT) 0.4 MG SL tablet Place 1 tablet (0.4 mg total) under the tongue every 5 (five) minutes as needed for chest pain. 20 tablet 1  . NONFORMULARY OR COMPOUNDED [place]  Combination Pain Cream -  Baclofen 2%, Doxepin 5%, Gabapentin 6%, Topiramate 2%,  Pentoxifylline 3% Apply 1-2 grams to affected area 3-4 times daily Qty. 120 gm 3 refills 1 each 3  . nystatin (NYAMYC) powder Apply 1 g topically 4 (four) times daily as needed (rash).     . ondansetron (ZOFRAN ODT) 4 MG disintegrating tablet Take 1 tablet (4 mg total) by mouth every 8 (eight) hours as needed for nausea or vomiting. 20 tablet 0  . ONETOUCH VERIO test strip daily.    [name] OVER THE COUNTER MEDICATION Apply 1 application topically daily as needed (pain). [place] Gell    . pregabalin (LYRICA) 100 MG capsule Take 100 mg by mouth at bedtime as needed (  leg cramps).     . Syringe/Needle, Disp, (SYRINGE 3CC/25GX1") 25G X 1" 3 ML MISC Use for b12 injections 50 each 0  . traMADol (ULTRAM) 50 MG tablet Take 0.5 tablets (25 mg total) by mouth every 12 (twelve) hours as needed for severe pain. 30 tablet 0  . umeclidinium-vilanterol (ANORO ELLIPTA) 62.5-25 MCG/INH AEPB Inhale 1 puff into the lungs daily. 1 each 12  . benzonatate (TESSALON) 200 MG capsule Take 1 capsule (200 mg total) by mouth 2 (two) times daily as needed for cough. 30 capsule 0   No facility-administered medications prior to visit.    Review of Systems;  Patient denies headache, fevers, malaise, unintentional weight loss, skin rash, eye pain, sinus congestion and sinus pain, sore throat, dysphagia,  hemoptysis , cough, dyspnea, wheezing, chest pain, palpitations, orthopnea, edema, abdominal pain, nausea, melena, diarrhea, constipation, flank pain, dysuria, hematuria, urinary  Frequency, nocturia, numbness, tingling, seizures,  Focal weakness, Loss of consciousness,  Tremor, insomnia, depression, anxiety, and suicidal ideation.      Objective:  BP 130/82 (BP Location: Left Arm, Patient Position: Sitting)   Pulse 98   Temp 98.2 F (36.8 C)   Ht 5' 4.02" (1.626 m)   Wt 242 lb 12.8 oz (110.1 kg)   SpO2 98%   BMI 41.66 kg/m   BP Readings from Last 3 Encounters:  01/13/20 130/82  01/05/20 (!) 121/54  11/25/19 120/68     Wt Readings from Last 3 Encounters:  01/13/20 242 lb 12.8 oz (110.1 kg)  01/05/20 246 lb 11.2 oz (111.9 kg)  11/30/19 253 lb (114.8 kg)    General appearance: alert, cooperative and appears stated age Ears: normal TM's and external ear canals both ears Throat: lips, mucosa, and tongue normal; teeth and gums normal Neck: no adenopathy, no carotid bruit, supple, symmetrical, trachea midline and thyroid not enlarged, symmetric, no tenderness/mass/nodules Back: symmetric, no curvature. ROM normal. No CVA tenderness. Lungs: clear to auscultation bilaterally Heart: regular rate and rhythm, S1, S2 normal, no murmur, click, rub or gallop Abdomen: soft, non-tender; bowel sounds normal; no masses,  no organomegaly Pulses: 2+ and symmetric Skin: Skin color, texture, turgor normal. No rashes or lesions Lymph nodes: Cervical, supraclavicular, and axillary nodes normal.  Lab Results  Component Value Date   HGBA1C 7.7 (H) 12/31/2019   HGBA1C 7.2 (H) 06/09/2019   HGBA1C 7.4 (H) 03/06/2019    Lab Results  Component Value Date   CREATININE 1.20 (H) 01/05/2020   CREATININE 1.06 (H) 12/30/2019   CREATININE 1.11 (H) 11/09/2019    Lab Results  Component Value Date   WBC 9.7 01/05/2020   HGB 10.0 (L) 01/05/2020   HCT 32.5 (L) 01/05/2020   PLT 310 01/05/2020   GLUCOSE 146 (H) 01/05/2020   CHOL 183 12/31/2019   TRIG 140 12/31/2019   HDL 42 12/31/2019   LDLDIRECT 111.0 06/16/2015   LDLCALC 113 (H) 12/31/2019   ALT 10 11/03/2019   AST 14 11/03/2019   NA 140 01/05/2020   K 4.1 01/05/2020   CL 107 01/05/2020   CREATININE 1.20 (H) 01/05/2020   BUN 14 01/05/2020   CO2 25 01/05/2020   TSH 3.50 01/01/2019   INR 1.1 12/31/2019   HGBA1C 7.7 (H) 12/31/2019   MICROALBUR 2.8 (H) 06/04/2017    DG Chest 2 View  Result Date: 12/30/2019 CLINICAL DATA:  Two weeks chest pain EXAM: CHEST - 2 VIEW COMPARISON:  CT 11/11/2019, radiograph 10/15/2018 FINDINGS: Some streaky atelectatic changes,  bandlike area opacity in the  lingula likely reflects an area of scarring seen on prior studies. No consolidation, features of edema, pneumothorax, or effusion. The cardiomediastinal contours are unremarkable. Prior reverse right shoulder arthroplasty. Mild to moderate degenerative changes in the left shoulder and spine. Cholecystectomy clips in the right upper quadrant. IMPRESSION: Mild bibasilar atelectasis with likely scarring in the lingula. No other acute cardiopulmonary disease. Electronically Signed   By: Lovena Le M.D.   On: 12/30/2019 16:21   CARDIAC CATHETERIZATION  Addendum Date: 01/01/2020    Ost LAD to Prox LAD lesion is 65% stenosed.  The left ventricular systolic function is normal.  LV end diastolic pressure is mildly elevated.  The left ventricular ejection fraction is 55-65% by visual estimate.  There is no mitral valve regurgitation.  Mid LAD lesion is 80% stenosed.    Result Date: 12/31/2019  Ost LAD to Prox LAD lesion is 65% stenosed.  The left ventricular systolic function is normal.  LV end diastolic pressure is mildly elevated.  The left ventricular ejection fraction is 55-65% by visual estimate.  There is no mitral valve regurgitation.    ECHOCARDIOGRAM COMPLETE  Result Date: 12/30/2019    ECHOCARDIOGRAM REPORT   Patient Name:   AMARAH BROSSMAN Date of Exam: 12/30/2019 Medical Rec #:  [id number]         Height:       64.5 in Accession #:    [id number]        Weight:       253.0 lb Date of Birth:  1946-03-07         BSA:          2.173 m Patient Age:    76 years          BP:           138/82 mmHg Patient Gender: F                 HR:           74 bpm. Exam Location:  [facility] Procedure: 2D Echo, Cardiac Doppler, Color Doppler and Intracardiac            Opacification Agent Indications:    R06.02 SOB; R55 Syncope; R07.9* Chest pain, unspecified  History:        Patient has prior history of Echocardiogram examinations, most                 recent 08/21/2017. CAD,  Signs/Symptoms:Shortness of Breath,                 Syncope, Chest Pain and Edema; Risk Factors:Sleep Apnea,                 Hypertension, Diabetes and Non-Smoker.  Sonographer:    Pilar Jarvis RDMS, RVT, RDCS Referring Phys: [id number] CHRISTOPHER END  Sonographer Comments: Following the echo, the patient was taken directly to the ED due to intermittent chest pain over the last 2-3 weeks IMPRESSIONS  1. Left ventricular ejection fraction, by estimation, is 65 to 70%. The left ventricle has normal function. The left ventricle has no regional wall motion abnormalities. Left ventricular diastolic parameters are consistent with Grade II diastolic dysfunction (pseudonormalization).  2. Right ventricular systolic function is normal. The right ventricular size is normal.  3. The mitral valve is normal in structure. No evidence of mitral valve regurgitation. No evidence of mitral stenosis.  4. The aortic valve is normal in structure. Aortic valve regurgitation is not visualized. No aortic stenosis is present. FINDINGS  Left Ventricle: Left ventricular ejection fraction, by estimation, is 65 to 70%. The left ventricle has normal function. The left ventricle has no regional wall motion abnormalities. Definity contrast agent was given IV to delineate the left ventricular  endocardial borders. The left ventricular internal cavity size was normal in size. There is no left ventricular hypertrophy. Left ventricular diastolic parameters are consistent with Grade II diastolic dysfunction (pseudonormalization). Right Ventricle: The right ventricular size is normal. No increase in right ventricular wall thickness. Right ventricular systolic function is normal. Left Atrium: Left atrial size was normal in size. Right Atrium: Right atrial size was normal in size. Pericardium: There is no evidence of pericardial effusion. Mitral Valve: The mitral valve is normal in structure. Normal mobility of the mitral valve leaflets. No evidence of mitral  valve regurgitation. No evidence of mitral valve stenosis. MV peak gradient, 6.5 mmHg. The mean mitral valve gradient is 2.5 mmHg. Tricuspid Valve: The tricuspid valve is normal in structure. Tricuspid valve regurgitation is not demonstrated. No evidence of tricuspid stenosis. Aortic Valve: The aortic valve is normal in structure. Aortic valve regurgitation is not visualized. No aortic stenosis is present. Aortic valve mean gradient measures 4.0 mmHg. Aortic valve peak gradient measures 9.1 mmHg. Aortic valve area, by VTI measures 2.22 cm. Pulmonic Valve: The pulmonic valve was normal in structure. Pulmonic valve regurgitation is trivial. No evidence of pulmonic stenosis. Aorta: The aortic root is normal in size and structure. Venous: The inferior vena cava was not well visualized. IAS/Shunts: No atrial level shunt detected by color flow Doppler.  LEFT VENTRICLE PLAX 2D LVIDd:         4.50 cm  Diastology LVIDs:         2.40 cm  LV e' lateral:   4.65 cm/s LV PW:         1.20 cm  LV E/e' lateral: 28.0 LV IVS:        1.00 cm  LV e' medial:    6.38 cm/s LVOT diam:     1.80 cm  LV E/e' medial:  20.4 LV SV:         66 LV SV Index:   30 LVOT Area:     2.54 cm  RIGHT VENTRICLE RV S prime:     14.00 cm/s TAPSE (M-mode): 2.6 cm LEFT ATRIUM             Index LA diam:        4.10 cm 1.89 cm/m LA Vol (A2C):   61.5 ml 28.30 ml/m LA Vol (A4C):   38.0 ml 17.48 ml/m LA Biplane Vol: 48.6 ml 22.36 ml/m  AORTIC VALVE                   PULMONIC VALVE AV Area (Vmax):    1.84 cm    PV Vmax:       0.84 m/s AV Area (Vmean):   2.33 cm    PV Peak grad:  2.8 mmHg AV Area (VTI):     2.22 cm AV Vmax:           151.00 cm/s AV Vmean:          84.300 cm/s AV VTI:            0.298 m AV Peak Grad:      9.1 mmHg AV Mean Grad:      4.0 mmHg LVOT Vmax:         109.00 cm/s LVOT Vmean:  77.200 cm/s LVOT VTI:          0.260 m LVOT/AV VTI ratio: 0.87  AORTA Ao Root diam: 2.90 cm Ao Asc diam:  3.40 cm Ao Arch diam: 2.9 cm MITRAL VALVE MV Area  (PHT): 2.96 cm     SHUNTS MV Peak grad:  6.5 mmHg     Systemic VTI:  0.26 m MV Mean grad:  2.5 mmHg     Systemic Diam: 1.80 cm MV Vmax:       1.27 m/s MV Vmean:      69.5 cm/s MV Decel Time: 256 msec MV E velocity: 130.00 cm/s MV A velocity: 84.80 cm/s MV E/A ratio:  1.53 Kate Sable MD Electronically signed by Kate Sable MD Signature Date/Time: 12/30/2019/3:27:40 PM    Final     Assessment & Plan:   Problem List Items Addressed This Visit      Unprioritized   S/P cardiac catheterization    Checking Cr today       Hypotension    Etiology unclear.  Normotensive currently       Diabetes mellitus type II, controlled ([place])    Loss of control noted with a1c rise from 7.2 to 7.7  .  Taking Lantus 12 units daily.  Advised to check PP and fasting and for medication adjustment       Coronary artery disease due to calcified coronary lesion    S/p PCI/stent of 2 lesions in LAD January 05 2020 .  Continue asa and plavix.  . Imdur stopped due to hypotension and headache  Prn ntg       RESOLVED: Bad odor of urine    Other Visit Diagnoses    Encounter for current long term use of antiplatelet drug    -  Primary   Relevant Orders   CBC with Differential/Platelet   Cough       Relevant Medications   benzonatate (TESSALON) 200 MG capsule   Decreased GFR       Relevant Orders   Basic metabolic panel      I am having Tia Masker. Sharlett Iles maintain her traMADol, nystatin, pregabalin, SYRINGE 3CC/25GX1", NONFORMULARY OR COMPOUNDED ITEM, ONE TOUCH DELICA LANCING DEV, clotrimazole-betamethasone, levETIRAcetam, cyanocobalamin, OneTouch Verio, Anoro Ellipta, Lantus SoloStar, buPROPion, butalbital-acetaminophen-caffeine, OVER THE COUNTER MEDICATION, acetaminophen, diphenhydrAMINE, dicyclomine, buPROPion, ondansetron, albuterol, ipratropium, FreeStyle Libre 14 Day Reader, FreeStyle Libre 14 Day Sensor, blood glucose meter kit and supplies, diazepam, diphenoxylate-atropine, Eszopiclone, B-D UF III  MINI PEN NEEDLES, aspirin EC, nitroGLYCERIN, atorvastatin, clopidogrel, OneTouch Verio, and benzonatate.  Meds ordered this encounter  Medications  . benzonatate (TESSALON) 200 MG capsule    Sig: Take 1 capsule (200 mg total) by mouth 2 (two) times daily as needed for cough.    Dispense:  30 capsule    Refill:  5    Medications Discontinued During This Encounter  Medication Reason  . benzonatate (TESSALON) 200 MG capsule Reorder    Follow-up: Return in about 4 weeks (around 02/10/2020).   [name], MD

## 2020-01-13 NOTE — Addendum Note (Signed)
Addended by: Tor Netters I on: 01/13/2020 04:30 PM   Modules accepted: Orders

## 2020-01-13 NOTE — Progress Notes (Signed)
Follow-up Outpatient Visit Date: 01/13/2020  Primary Care Provider: [name], MD [place] [address]  Chief Complaint: Follow-up coronary artery disease  HPI:  Ms. Claude is a 74 y.o. female with history of coronary artery disease status post recent PCI to the ostial and mid LAD in the setting of unstable angina, hypertension, diabetes mellitus, iron deficiency anemia status post gastric bypass x2, irritable bowel syndrome, esophageal stenosis, chronic dizziness with gait instability, cervicogenic headache with cervical spine stenosis, obstructive sleep apnea on CPAP, restless leg syndrome, and adrenal mass, who presents for follow-up of coronary artery disease.  I last saw Ms. Hargrove in the office in mid May, at which time she reported worsening exertional dyspnea.  We agreed to perform an echocardiogram, which was scheduled for last month.  Study showed preserved LVEF.  However, during that encounter, Ms. Mcdonnell complained of significant chest pain over the preceding 2 weeks.  She was sent to the emergency department and ultimately admitted.  Catheterization showed significant disease involving the ostial and mid LAD, which was treated with stenting in a staged fashion.  She contacted our office a few days after hospital discharge, complaining of mild epistaxis.  She was advised to continue dual antiplatelet therapy.  Today, Ms. Ficek reports that she is feeling much better than prior to her recent PCI.  She still feels very tired but has not had any significant dyspnea.  She has started working with physical therapy and would like to participate in cardiac rehabilitation as well.  She reports a single brief episode of chest pain yesterday that she describes as a soreness in the center and right side of her chest.  She wonders if it was internal bruising from her PCI.  She has not had any exertional chest pain.  She is sleeping better, which she  attributes to her recent coronary intervention.  Other than a self-limited episode of mild epistaxis that she attributes to nasal irritation from wearing nasal cannula in the hospital, Ms. Harrington has not had any bleeding.  She denies significant edema.  She is concerned about a persistent metallic taste in her mouth that began during her hospitalization and wonders if it could be a medication that she is receiving.  She has started a new diet and happily reports that she has lost over 14 pounds in the last week and a half.  --------------------------------------------------------------------------------------------------  Cardiovascular Problems:  Intermittent left bundle branch block  Coronary artery disease status post PCI to ostial and mid LAD (12/2018  Risk Factors:  Hypertension, hyperlipidemia, diabetes mellitus, obesity, sedentary lifestyle, and age greater than 23  Cath/PCI:  PCI (01/04/20): Successful PCI to the ostial and mid LAD using Resolute Onyx 3.5 x 12 mm and 2.75 x 12 mm drug-eluting stents, respectively.  LHC (12/31/19): LMCA normal.  Sequential 80% ostial and mid LAD stenoses.  No significant disease involving LCx and RCA  LHC (07/24/12, [facility]): LMCA normal. LAD, LCx, and RCA with minor luminal irregularities.  LHC (04/15/86, [facility]): Normal LMCA, LAD, LCx, and RCA. LVEF 65%.  CV Surgery:  None  EP Procedures and Devices:  None  Non-Invasive Evaluation(s):  TTE (12/30/2019): Normal LV size with LVEF 65-70% and grade 2 diastolic dysfunction.  Normal RV size and function.  No significant valvular abnormality.  Pharmacologic MPI (06/03/19): Low risk study without evidence of ischemia.  Small fixed defect at the apex was felt to be most consistent with artifact and less likely scar.  Cardiac CT  calcium score (09/03/16, Dr. Humphrey Rolls): Coronary calcium score 1548.  Pharmacologic myocardial perfusion stress test (08/08/16, Dr. Humphrey Rolls): "Equivocal stress test" with small  inferior wall reversible defect and hyperdynamic LV contraction (EF 87%).  Transthoracic echocardiogram (08/06/16, Dr.Khan): Technically difficult study with mildly dilated left ventricle. LVEF was normal with normal wall motion. Grade 1 diastolic dysfunction. Mildly dilated RV with normal contraction. Mitral annular calcification present with mild MR. Mild PR and TR also present. Normal pulmonary artery pressure. Severe left and mild right atrial enlargement.  Pharmacologic myocardial perfusion stress test (08/11/15, [facility]): Normal perfusion without ischemia or scar. LVEF 81%.  Recent CV Pertinent Labs: Lab Results  Component Value Date   CHOL 183 12/31/2019   CHOL 155 02/27/2014   HDL 42 12/31/2019   HDL 43 02/27/2014   LDLCALC 113 (H) 12/31/2019   LDLCALC 88 02/27/2014   LDLDIRECT 111.0 06/16/2015   TRIG 140 12/31/2019   TRIG 121 02/27/2014   CHOLHDL 4.4 12/31/2019   INR 1.1 12/31/2019   K 4.1 01/05/2020   K 4.0 06/21/2014   MG 1.5 (L) 10/22/2018   MG 1.5 (L) 08/05/2012   BUN 14 01/05/2020   BUN 11 10/22/2018   BUN 12 06/21/2014   CREATININE 1.20 (H) 01/05/2020   CREATININE 0.88 03/06/2019    Past medical and surgical history were reviewed and updated in EPIC.  Current Meds  Medication Sig  . albuterol (VENTOLIN HFA) 108 (90 Base) MCG/ACT inhaler Inhale 2 puffs into the lungs every 8 (eight) hours as needed for wheezing or shortness of breath.  [name] aspirin EC 81 MG tablet Take 1 tablet (81 mg total) by mouth daily.  [name] atorvastatin (LIPITOR) 40 MG tablet Take 1 tablet (40 mg total) by mouth daily.  . benzonatate (TESSALON) 200 MG capsule Take 1 capsule (200 mg total) by mouth 2 (two) times daily as needed for cough.  . blood glucose meter kit and supplies Use to check sugars up to three times daily. Dx code: E11.9  . Blood Glucose Monitoring Suppl (ONETOUCH VERIO) w/Device KIT 1 Device by Does not apply route daily. Use to test blood glucose once daily. Dx code = E11.9  .  buPROPion (WELLBUTRIN XL) 150 MG 24 hr tablet Take 1 tablet (150 mg total) by mouth daily. (Patient taking differently: Take 150 mg by mouth every morning. )  . buPROPion (WELLBUTRIN) 75 MG tablet TAKE 1-2 TABLETS (75-150 MG TOTAL) BY MOUTH SEE ADMIN INSTRUCTIONS. TAKE 2 TABLETS IN THE MORNING AND 1 TABLET IN THE AFTERNOON (Patient taking differently: 75 mg at bedtime. )  . butalbital-acetaminophen-caffeine (FIORICET) 50-325-40 MG tablet Take 1 tablet by mouth every 6 (six) hours as needed for headache.  . clopidogrel (PLAVIX) 75 MG tablet Take 1 tablet (75 mg total) by mouth daily.  . clotrimazole-betamethasone (LOTRISONE) cream Apply 1 application topically 2 (two) times daily. (Patient taking differently: Apply 1 application topically 2 (two) times daily as needed (rash). )  . Continuous Blood Gluc Receiver (FREESTYLE LIBRE 14 DAY READER) DEVI 1 applicator by Does not apply route 4 (four) times daily.  . Continuous Blood Gluc Sensor (FREESTYLE LIBRE 14 DAY SENSOR) MISC Use to test blood sugar 4 times daily  . cyanocobalamin (,VITAMIN B-12,) 1000 MCG/ML injection Inject 1 ml (1000 mcg ) IM weekly x 4,  Then monthly thereafter (Patient taking differently: Inject 1,000 mcg into the muscle every 30 (thirty) days. On the 12th day)  . diazepam (VALIUM) 5 MG tablet Take 1 tablet (5 mg total) by  mouth at bedtime as needed for anxiety.  . dicyclomine (BENTYL) 10 MG capsule Take 1 capsule (10 mg total) by mouth 4 (four) times daily -  before meals and at bedtime.  . diphenhydrAMINE (BENADRYL) 25 MG tablet Take 50 mg by mouth daily as needed for allergies.  . diphenoxylate-atropine (LOMOTIL) 2.5-0.025 MG tablet Take 1 tablet by mouth 4 (four) times daily as needed for diarrhea or loose stools.  . Insulin Glargine (LANTUS SOLOSTAR) 100 UNIT/ML Solostar Pen Inject 11 Units into the skin daily. (Patient taking differently: Inject 10-15 Units into the skin daily. )  . Insulin Pen Needle (B-D UF III MINI PEN  NEEDLES) 31G X 5 MM MISC USE TO INJECT INSULIN DAILY  . ipratropium (ATROVENT) 0.06 % nasal spray Place 2 sprays into both nostrils 4 (four) times daily.  Elmore Guise Devices (ONE TOUCH DELICA LANCING DEV) MISC Use to test blood glucose once daily. Dx code = E11.9  . levETIRAcetam (KEPPRA) 750 MG tablet Take 1 tablet (750 mg total) by mouth at bedtime. (Patient taking differently: Take 750 mg by mouth at bedtime as needed (restless legs). )  . nitroGLYCERIN (NITROSTAT) 0.4 MG SL tablet Place 1 tablet (0.4 mg total) under the tongue every 5 (five) minutes as needed for chest pain.  . NONFORMULARY OR COMPOUNDED [place]  Combination Pain Cream -  Baclofen 2%, Doxepin 5%, Gabapentin 6%, Topiramate 2%, Pentoxifylline 3% Apply 1-2 grams to affected area 3-4 times daily Qty. 120 gm 3 refills  . nystatin [facility]) powder Apply 1 g topically 4 (four) times daily as needed (rash).   . ondansetron (ZOFRAN ODT) 4 MG disintegrating tablet Take 1 tablet (4 mg total) by mouth every 8 (eight) hours as needed for nausea or vomiting.  Glory Rosebush VERIO test strip daily.  [name] OVER THE COUNTER MEDICATION Apply 1 application topically daily as needed (pain). [place] Gell  . pregabalin (LYRICA) 100 MG capsule Take 100 mg by mouth at bedtime as needed (leg cramps).   . Syringe/Needle, Disp, (SYRINGE 3CC/25GX1") 25G X 1" 3 ML MISC Use for b12 injections  . traMADol (ULTRAM) 50 MG tablet Take 0.5 tablets (25 mg total) by mouth every 12 (twelve) hours as needed for severe pain.  [name] umeclidinium-vilanterol (ANORO ELLIPTA) 62.5-25 MCG/INH AEPB Inhale 1 puff into the lungs daily.    Allergies: Demeclocycline, Erythromycin, Flagyl [metronidazole], Glucophage [metformin hcl], Tetracyclines & related, Diovan [valsartan], Sulfa antibiotics, and Xanax [alprazolam]  Social History   Tobacco Use  . Smoking status: Never Smoker  . Smokeless tobacco: Never Used  Vaping Use  . Vaping Use: Never used  Substance Use  Topics  . Alcohol use: No  . Drug use: No    Family History  Problem Relation Age of Onset  . Heart disease Father   . Hypertension Father   . Prostate cancer Father   . Stroke Father   . Osteoporosis Father   . Stroke Mother   . Depression Mother   . Headache Mother   . Heart disease Mother   . Thyroid disease Mother   . Hypertension Mother   . Diabetes Daughter   . Heart disease Daughter   . Hypertension Daughter   . Hypertension Son     Review of Systems: A 12-system review of systems was performed and was negative except as noted in the HPI.  --------------------------------------------------------------------------------------------------  Physical Exam: BP 124/70 (BP Location: Right Arm, Patient Position: Sitting, Cuff Size: Normal)   Pulse 80   Ht  5' 4.5" (1.638 m)   Wt 243 lb 8 oz (110.5 kg)   SpO2 98%   BMI 41.15 kg/m   General: NAD.  Accompanied by her husband. HEENT: No conjunctival pallor or scleral icterus. Facemask in place. Neck: No JVD or HJR, though body habitus limits evaluation. Lungs: Normal work of breathing. Clear to auscultation bilaterally without wheezes or crackles. Heart: Regular rate and rhythm without murmurs, rubs, or gallops. Abd: Bowel sounds present. Soft, NT/ND without. Ext: No lower extremity edema.  Right radial arteriotomy site with minimal bruising.  No hematoma.  2+ right radial pulse. Skin: Warm and dry without rash.  EKG: Normal sinus rhythm without abnormality.  Lab Results  Component Value Date   WBC 9.7 01/05/2020   HGB 10.0 (L) 01/05/2020   HCT 32.5 (L) 01/05/2020   MCV 80.4 01/05/2020   PLT 310 01/05/2020    Lab Results  Component Value Date   NA 140 01/05/2020   K 4.1 01/05/2020   CL 107 01/05/2020   CO2 25 01/05/2020   BUN 14 01/05/2020   CREATININE 1.20 (H) 01/05/2020   GLUCOSE 146 (H) 01/05/2020   ALT 10 11/03/2019    Lab Results  Component Value Date   CHOL 183 12/31/2019   HDL 42 12/31/2019    LDLCALC 113 (H) 12/31/2019   LDLDIRECT 111.0 06/16/2015   TRIG 140 12/31/2019   CHOLHDL 4.4 12/31/2019    --------------------------------------------------------------------------------------------------  ASSESSMENT AND PLAN: Unstable angina: Ms. Rahl is now status post PCI to the ostial and mid LAD in the setting of unstable angina last week.  She reports feeling significantly better than she has in quite some time.  Single episode of brief chest pain yesterday is atypical and not consistent with recurrent angina.  EKG today remains normal.  We will continue 12 months of dual antiplatelet therapy.  Hopefully, metallic taste will improve with time if it is related to one of her medications.  However, if it persists and is unbearable, we could consider transitioning to ticagrelor.  We will reach out to cardiac rehab to ensure that Ms. Ell is enrolled.  Chronic HFpEF: Ms. Lamia appears grossly euvolemic, though volume assessment is challenging in the setting of morbid obesity.  Recent echo showed normal LVEF.  I encouraged sodium restriction and continued weight loss.  Hypertension: Blood pressure well controlled today.  No medication changes at this time.  Hyperlipidemia: Continue atorvastatin 40 mg daily with target LDL less than 70.  Type 2 diabetes mellitus: Continue current medications and close follow-up with [name].  Follow-up: Return to clinic in 1 month.  [name], MD 01/13/2020 3:21 PM

## 2020-01-13 NOTE — Assessment & Plan Note (Signed)
Checking Cr today

## 2020-01-13 NOTE — Addendum Note (Signed)
Addended by: Tor Netters I on: 01/13/2020 03:58 PM   Modules accepted: Orders

## 2020-01-14 ENCOUNTER — Encounter: Payer: Self-pay | Admitting: Internal Medicine

## 2020-01-14 DIAGNOSIS — I5032 Chronic diastolic (congestive) heart failure: Secondary | ICD-10-CM | POA: Insufficient documentation

## 2020-01-14 DIAGNOSIS — I5030 Unspecified diastolic (congestive) heart failure: Secondary | ICD-10-CM | POA: Insufficient documentation

## 2020-01-14 DIAGNOSIS — E785 Hyperlipidemia, unspecified: Secondary | ICD-10-CM | POA: Insufficient documentation

## 2020-01-14 DIAGNOSIS — I5033 Acute on chronic diastolic (congestive) heart failure: Secondary | ICD-10-CM | POA: Insufficient documentation

## 2020-01-15 ENCOUNTER — Encounter: Payer: Medicare Other | Attending: Cardiovascular Disease | Admitting: *Deleted

## 2020-01-15 ENCOUNTER — Other Ambulatory Visit: Payer: Self-pay | Admitting: Internal Medicine

## 2020-01-15 ENCOUNTER — Other Ambulatory Visit: Payer: Self-pay

## 2020-01-15 DIAGNOSIS — Z79899 Other long term (current) drug therapy: Secondary | ICD-10-CM | POA: Insufficient documentation

## 2020-01-15 DIAGNOSIS — I10 Essential (primary) hypertension: Secondary | ICD-10-CM | POA: Insufficient documentation

## 2020-01-15 DIAGNOSIS — G2581 Restless legs syndrome: Secondary | ICD-10-CM | POA: Insufficient documentation

## 2020-01-15 DIAGNOSIS — Z7902 Long term (current) use of antithrombotics/antiplatelets: Secondary | ICD-10-CM | POA: Insufficient documentation

## 2020-01-15 DIAGNOSIS — I447 Left bundle-branch block, unspecified: Secondary | ICD-10-CM | POA: Insufficient documentation

## 2020-01-15 DIAGNOSIS — E118 Type 2 diabetes mellitus with unspecified complications: Secondary | ICD-10-CM | POA: Insufficient documentation

## 2020-01-15 DIAGNOSIS — F329 Major depressive disorder, single episode, unspecified: Secondary | ICD-10-CM | POA: Insufficient documentation

## 2020-01-15 DIAGNOSIS — Z794 Long term (current) use of insulin: Secondary | ICD-10-CM | POA: Insufficient documentation

## 2020-01-15 DIAGNOSIS — D72823 Leukemoid reaction: Secondary | ICD-10-CM

## 2020-01-15 DIAGNOSIS — Z7982 Long term (current) use of aspirin: Secondary | ICD-10-CM | POA: Insufficient documentation

## 2020-01-15 DIAGNOSIS — R944 Abnormal results of kidney function studies: Secondary | ICD-10-CM

## 2020-01-15 DIAGNOSIS — G4733 Obstructive sleep apnea (adult) (pediatric): Secondary | ICD-10-CM | POA: Insufficient documentation

## 2020-01-15 DIAGNOSIS — Z955 Presence of coronary angioplasty implant and graft: Secondary | ICD-10-CM | POA: Insufficient documentation

## 2020-01-15 NOTE — Progress Notes (Signed)
Initial telephone orientation completed. Diagnosis can be found in [facility] 6/16. EP orientation scheduled for 7/12 at 1pm

## 2020-01-15 NOTE — Progress Notes (Signed)
You have a slightly elevated white count , the cause is not clear.  Kidney function has been  slightly low since April, worse since the cardiac cath.  I recommend increasing your fluid intake and repeating both labs in one week.

## 2020-01-20 ENCOUNTER — Telehealth: Payer: Self-pay | Admitting: Internal Medicine

## 2020-01-20 DIAGNOSIS — Z955 Presence of coronary angioplasty implant and graft: Secondary | ICD-10-CM

## 2020-01-20 DIAGNOSIS — I119 Hypertensive heart disease without heart failure: Secondary | ICD-10-CM | POA: Diagnosis not present

## 2020-01-20 DIAGNOSIS — K222 Esophageal obstruction: Secondary | ICD-10-CM | POA: Diagnosis not present

## 2020-01-20 DIAGNOSIS — F329 Major depressive disorder, single episode, unspecified: Secondary | ICD-10-CM | POA: Diagnosis not present

## 2020-01-20 DIAGNOSIS — I447 Left bundle-branch block, unspecified: Secondary | ICD-10-CM | POA: Diagnosis not present

## 2020-01-20 DIAGNOSIS — M5030 Other cervical disc degeneration, unspecified cervical region: Secondary | ICD-10-CM

## 2020-01-20 DIAGNOSIS — I25119 Atherosclerotic heart disease of native coronary artery with unspecified angina pectoris: Secondary | ICD-10-CM | POA: Diagnosis not present

## 2020-01-20 DIAGNOSIS — R1310 Dysphagia, unspecified: Secondary | ICD-10-CM | POA: Diagnosis not present

## 2020-01-20 DIAGNOSIS — G2581 Restless legs syndrome: Secondary | ICD-10-CM | POA: Diagnosis not present

## 2020-01-20 DIAGNOSIS — R55 Syncope and collapse: Secondary | ICD-10-CM

## 2020-01-20 DIAGNOSIS — Z48812 Encounter for surgical aftercare following surgery on the circulatory system: Secondary | ICD-10-CM | POA: Diagnosis not present

## 2020-01-20 DIAGNOSIS — E1140 Type 2 diabetes mellitus with diabetic neuropathy, unspecified: Secondary | ICD-10-CM | POA: Diagnosis not present

## 2020-01-20 DIAGNOSIS — M6281 Muscle weakness (generalized): Secondary | ICD-10-CM | POA: Diagnosis not present

## 2020-01-20 DIAGNOSIS — G4733 Obstructive sleep apnea (adult) (pediatric): Secondary | ICD-10-CM

## 2020-01-20 DIAGNOSIS — Z794 Long term (current) use of insulin: Secondary | ICD-10-CM | POA: Diagnosis not present

## 2020-01-20 MED ORDER — CITALOPRAM HYDROBROMIDE 10 MG PO TABS: 10.0000 mg | ORAL_TABLET | Freq: Every day | ORAL | 1 refills | Status: DC

## 2020-01-20 NOTE — Telephone Encounter (Signed)
Citalopram sent to [facility]

## 2020-01-20 NOTE — Telephone Encounter (Signed)
She has two different wellbutrin doses on chart.  She will need to confirm which dose she is taking and how before I will refill

## 2020-01-20 NOTE — Telephone Encounter (Signed)
Pt needs depression medication refilled. I did not see on med list. Wants sent to [place]. Please call with questions

## 2020-01-20 NOTE — Telephone Encounter (Addendum)
Patient is taking 10mg  celexa  Daily. Last rx sent in was 07-08-19.  She stated she is taking the Wellbutrin 75 mg and the celexa. She needs celexa sent in for refill.

## 2020-01-21 DIAGNOSIS — I447 Left bundle-branch block, unspecified: Secondary | ICD-10-CM | POA: Diagnosis not present

## 2020-01-21 DIAGNOSIS — E1140 Type 2 diabetes mellitus with diabetic neuropathy, unspecified: Secondary | ICD-10-CM | POA: Diagnosis not present

## 2020-01-21 DIAGNOSIS — Z48812 Encounter for surgical aftercare following surgery on the circulatory system: Secondary | ICD-10-CM | POA: Diagnosis not present

## 2020-01-21 DIAGNOSIS — I119 Hypertensive heart disease without heart failure: Secondary | ICD-10-CM | POA: Diagnosis not present

## 2020-01-21 DIAGNOSIS — I25119 Atherosclerotic heart disease of native coronary artery with unspecified angina pectoris: Secondary | ICD-10-CM | POA: Diagnosis not present

## 2020-01-21 DIAGNOSIS — M6281 Muscle weakness (generalized): Secondary | ICD-10-CM | POA: Diagnosis not present

## 2020-01-22 ENCOUNTER — Other Ambulatory Visit: Payer: Medicare Other

## 2020-01-22 ENCOUNTER — Other Ambulatory Visit (INDEPENDENT_AMBULATORY_CARE_PROVIDER_SITE_OTHER): Payer: Medicare Other

## 2020-01-22 ENCOUNTER — Other Ambulatory Visit: Payer: Self-pay

## 2020-01-22 DIAGNOSIS — R944 Abnormal results of kidney function studies: Secondary | ICD-10-CM | POA: Diagnosis not present

## 2020-01-22 DIAGNOSIS — R8290 Unspecified abnormal findings in urine: Secondary | ICD-10-CM

## 2020-01-22 DIAGNOSIS — E1142 Type 2 diabetes mellitus with diabetic polyneuropathy: Secondary | ICD-10-CM

## 2020-01-22 DIAGNOSIS — D72823 Leukemoid reaction: Secondary | ICD-10-CM

## 2020-01-22 LAB — BASIC METABOLIC PANEL
BUN: 22 mg/dL (ref 6–23)
CO2: 16 mEq/L — ABNORMAL LOW (ref 19–32)
Calcium: 9 mg/dL (ref 8.4–10.5)
Chloride: 107 mEq/L (ref 96–112)
Creatinine, Ser: 1.01 mg/dL (ref 0.40–1.20)
GFR: 53.53 mL/min — ABNORMAL LOW (ref >60.00)
Glucose, Bld: 144 mg/dL — ABNORMAL HIGH (ref 70–99)
Potassium: 4.3 mEq/L (ref 3.5–5.1)
Sodium: 139 mEq/L (ref 135–145)

## 2020-01-22 LAB — URINALYSIS, ROUTINE W REFLEX MICROSCOPIC
Bilirubin Urine: NEGATIVE
Ketones, ur: NEGATIVE
Leukocytes,Ua: NEGATIVE
Nitrite: NEGATIVE
Specific Gravity, Urine: 1.025 (ref 1.000–1.030)
Total Protein, Urine: NEGATIVE
Urine Glucose: NEGATIVE
Urobilinogen, UA: 0.2 (ref 0.0–1.0)
pH: 5.5 (ref 5.0–8.0)

## 2020-01-22 NOTE — Addendum Note (Signed)
Addended by: Tor Netters I on: 01/22/2020 12:16 PM   Modules accepted: Orders

## 2020-01-22 NOTE — Addendum Note (Signed)
Addended by: Tor Netters I on: 01/22/2020 12:18 PM   Modules accepted: Orders

## 2020-01-22 NOTE — Addendum Note (Signed)
Addended by: Tor Netters I on: 01/22/2020 01:27 PM   Modules accepted: Orders

## 2020-01-23 LAB — URINE CULTURE
MICRO NUMBER:: 10686125
Result:: NO GROWTH
SPECIMEN QUALITY:: ADEQUATE

## 2020-01-25 ENCOUNTER — Other Ambulatory Visit
Admission: RE | Admit: 2020-01-25 | Discharge: 2020-01-25 | Disposition: A | Discharge status: Home / Self Care | Payer: Medicare Other | Attending: Internal Medicine | Admitting: Internal Medicine

## 2020-01-25 ENCOUNTER — Other Ambulatory Visit: Payer: Self-pay

## 2020-01-25 VITALS — Ht 65.0 in | Wt 241.2 lb

## 2020-01-25 DIAGNOSIS — F329 Major depressive disorder, single episode, unspecified: Secondary | ICD-10-CM | POA: Diagnosis not present

## 2020-01-25 DIAGNOSIS — G2581 Restless legs syndrome: Secondary | ICD-10-CM | POA: Diagnosis not present

## 2020-01-25 DIAGNOSIS — Z794 Long term (current) use of insulin: Secondary | ICD-10-CM | POA: Diagnosis not present

## 2020-01-25 DIAGNOSIS — Z95818 Presence of other cardiac implants and grafts: Secondary | ICD-10-CM | POA: Insufficient documentation

## 2020-01-25 DIAGNOSIS — Z955 Presence of coronary angioplasty implant and graft: Secondary | ICD-10-CM

## 2020-01-25 DIAGNOSIS — I10 Essential (primary) hypertension: Secondary | ICD-10-CM | POA: Diagnosis not present

## 2020-01-25 DIAGNOSIS — Z7902 Long term (current) use of antithrombotics/antiplatelets: Secondary | ICD-10-CM | POA: Diagnosis not present

## 2020-01-25 DIAGNOSIS — E118 Type 2 diabetes mellitus with unspecified complications: Secondary | ICD-10-CM | POA: Diagnosis not present

## 2020-01-25 DIAGNOSIS — Z79899 Other long term (current) drug therapy: Secondary | ICD-10-CM | POA: Diagnosis not present

## 2020-01-25 DIAGNOSIS — G4733 Obstructive sleep apnea (adult) (pediatric): Secondary | ICD-10-CM | POA: Diagnosis not present

## 2020-01-25 DIAGNOSIS — I447 Left bundle-branch block, unspecified: Secondary | ICD-10-CM | POA: Diagnosis not present

## 2020-01-25 DIAGNOSIS — Z7982 Long term (current) use of aspirin: Secondary | ICD-10-CM | POA: Diagnosis not present

## 2020-01-25 LAB — CBC WITH DIFFERENTIAL/PLATELET
Abs Immature Granulocytes: 0.05 10*3/uL (ref 0.00–0.07)
Basophils Absolute: 0.1 10*3/uL (ref 0.0–0.1)
Basophils Relative: 1 %
Eosinophils Absolute: 0.2 10*3/uL (ref 0.0–0.5)
Eosinophils Relative: 1 %
HCT: 32.9 % — ABNORMAL LOW (ref 36.0–46.0)
Hemoglobin: 10.5 g/dL — ABNORMAL LOW (ref 12.0–15.0)
Immature Granulocytes: 1 %
Lymphocytes Relative: 17 %
Lymphs Abs: 1.8 10*3/uL (ref 0.7–4.0)
MCH: 25.4 pg — ABNORMAL LOW (ref 26.0–34.0)
MCHC: 31.9 g/dL (ref 30.0–36.0)
MCV: 79.5 fL — ABNORMAL LOW (ref 80.0–100.0)
Monocytes Absolute: 0.5 10*3/uL (ref 0.1–1.0)
Monocytes Relative: 4 %
Neutro Abs: 8 10*3/uL — ABNORMAL HIGH (ref 1.7–7.7)
Neutrophils Relative %: 76 %
Platelets: 325 10*3/uL (ref 150–400)
RBC: 4.14 MIL/uL (ref 3.87–5.11)
RDW: 16.9 % — ABNORMAL HIGH (ref 11.5–15.5)
WBC: 10.4 10*3/uL (ref 4.0–10.5)
nRBC: 0 % (ref 0.0–0.2)

## 2020-01-25 LAB — BASIC METABOLIC PANEL
Anion gap: 12 (ref 5–15)
BUN: 17 mg/dL (ref 8–23)
CO2: 20 mmol/L — ABNORMAL LOW (ref 22–32)
Calcium: 8.8 mg/dL — ABNORMAL LOW (ref 8.9–10.3)
Chloride: 107 mmol/L (ref 98–111)
Creatinine, Ser: 1.11 mg/dL — ABNORMAL HIGH (ref 0.44–1.00)
GFR calc Af Amer: 57 mL/min — ABNORMAL LOW (ref >60)
GFR calc non Af Amer: 49 mL/min — ABNORMAL LOW (ref >60)
Glucose, Bld: 187 mg/dL — ABNORMAL HIGH (ref 70–99)
Potassium: 3.9 mmol/L (ref 3.5–5.1)
Sodium: 139 mmol/L (ref 135–145)

## 2020-01-25 LAB — HEMOGLOBIN A1C
Hgb A1c MFr Bld: 7.6 % — ABNORMAL HIGH (ref 4.8–5.6)
Mean Plasma Glucose: 171 mg/dL

## 2020-01-25 NOTE — Progress Notes (Signed)
Cardiac Individual Treatment Plan  Patient Details  Name: Denise Sharp MRN: [id number] Date of Birth: 08-06-1945 Referring Provider:     Cardiac Rehab from 01/25/2020 in [facility] Cardiac and Pulmonary Rehab  Referring Provider Arida (End)      Initial Encounter Date:    Cardiac Rehab from 01/25/2020 in [facility] Cardiac and Pulmonary Rehab  Date 01/25/20      Visit Diagnosis: Status post coronary artery stent placement  Patient's Home Medications on Admission:  Current Outpatient Medications:  .  albuterol (VENTOLIN HFA) 108 (90 Base) MCG/ACT inhaler, Inhale 2 puffs into the lungs every 8 (eight) hours as needed for wheezing or shortness of breath., Disp: 8 g, Rfl: 2 .  aspirin EC 81 MG tablet, Take 1 tablet (81 mg total) by mouth daily., Disp: 30 tablet, Rfl: 2 .  atorvastatin (LIPITOR) 40 MG tablet, Take 1 tablet (40 mg total) by mouth daily., Disp: 30 tablet, Rfl: 0 .  benzonatate (TESSALON) 200 MG capsule, Take 1 capsule (200 mg total) by mouth 2 (two) times daily as needed for cough., Disp: 30 capsule, Rfl: 5 .  blood glucose meter kit and supplies, Use to check sugars up to three times daily. Dx code: E11.9, Disp: 1 each, Rfl: 0 .  Blood Glucose Monitoring Suppl (ONETOUCH VERIO) w/Device KIT, 1 Device by Does not apply route daily. Use to test blood glucose once daily. Dx code = E11.9, Disp: 1 kit, Rfl: 0 .  buPROPion (WELLBUTRIN) 75 MG tablet, TAKE 1-2 TABLETS (75-150 MG TOTAL) BY MOUTH SEE ADMIN INSTRUCTIONS. TAKE 2 TABLETS IN THE MORNING AND 1 TABLET IN THE AFTERNOON (Patient taking differently: 75 mg at bedtime. ), Disp: 270 tablet, Rfl: 1 .  butalbital-acetaminophen-caffeine (FIORICET) 50-325-40 MG tablet, Take 1 tablet by mouth every 6 (six) hours as needed for headache., Disp: 90 tablet, Rfl: 5 .  citalopram (CELEXA) 10 MG tablet, Take 1 tablet (10 mg total) by mouth daily., Disp: 90 tablet, Rfl: 1 .  clopidogrel (PLAVIX) 75 MG tablet, Take 1 tablet (75 mg total) by mouth daily.,  Disp: 30 tablet, Rfl: 0 .  clotrimazole-betamethasone (LOTRISONE) cream, Apply 1 application topically 2 (two) times daily. (Patient taking differently: Apply 1 application topically 2 (two) times daily as needed (rash). ), Disp: 45 g, Rfl: 3 .  Continuous Blood Gluc Receiver (FREESTYLE LIBRE 14 DAY READER) DEVI, 1 applicator by Does not apply route 4 (four) times daily., Disp: 1 each, Rfl: 6 .  Continuous Blood Gluc Sensor (FREESTYLE LIBRE 14 DAY SENSOR) MISC, Use to test blood sugar 4 times daily, Disp: 1 each, Rfl: 6 .  cyanocobalamin (,VITAMIN B-12,) 1000 MCG/ML injection, Inject 1 ml (1000 mcg ) IM weekly x 4,  Then monthly thereafter (Patient taking differently: Inject 1,000 mcg into the muscle every 30 (thirty) days. On the 12th day), Disp: 110 mL, Rfl: 0 .  diazepam (VALIUM) 5 MG tablet, Take 1 tablet (5 mg total) by mouth at bedtime as needed for anxiety., Disp: 30 tablet, Rfl: 5 .  dicyclomine (BENTYL) 10 MG capsule, Take 1 capsule (10 mg total) by mouth 4 (four) times daily -  before meals and at bedtime., Disp: 360 capsule, Rfl: 3 .  diphenhydrAMINE (BENADRYL) 25 MG tablet, Take 50 mg by mouth daily as needed for allergies., Disp: , Rfl:  .  diphenoxylate-atropine (LOMOTIL) 2.5-0.025 MG tablet, Take 1 tablet by mouth 4 (four) times daily as needed for diarrhea or loose stools., Disp: 60 tablet, Rfl: 0 .  Eszopiclone 3  MG TABS, Take 1 tablet (3 mg total) by mouth at bedtime., Disp: 90 tablet, Rfl: 1 .  Insulin Glargine (LANTUS SOLOSTAR) 100 UNIT/ML Solostar Pen, Inject 11 Units into the skin daily. (Patient taking differently: Inject 10-15 Units into the skin daily. ), Disp: 5 pen, Rfl: 3 .  Insulin Pen Needle (B-D UF III MINI PEN NEEDLES) 31G X 5 MM MISC, USE TO INJECT INSULIN DAILY, Disp: 100 each, Rfl: 3 .  ipratropium (ATROVENT) 0.06 % nasal spray, Place 2 sprays into both nostrils 4 (four) times daily., Disp: 15 mL, Rfl: 12 .  Lancet Devices (ONE TOUCH DELICA LANCING DEV) MISC, Use to  test blood glucose once daily. Dx code = E11.9, Disp: 1 each, Rfl: 0 .  levETIRAcetam (KEPPRA) 750 MG tablet, Take 1 tablet (750 mg total) by mouth at bedtime. (Patient taking differently: Take 750 mg by mouth at bedtime as needed (restless legs). ), Disp: 90 tablet, Rfl: 3 .  nitroGLYCERIN (NITROSTAT) 0.4 MG SL tablet, Place 1 tablet (0.4 mg total) under the tongue every 5 (five) minutes as needed for chest pain., Disp: 20 tablet, Rfl: 1 .  NONFORMULARY OR COMPOUNDED ITEM, [facility]  Combination Pain Cream -  Baclofen 2%, Doxepin 5%, Gabapentin 6%, Topiramate 2%, Pentoxifylline 3% Apply 1-2 grams to affected area 3-4 times daily Qty. 120 gm 3 refills, Disp: 1 each, Rfl: 3 .  nystatin (NYAMYC) powder, Apply 1 g topically 4 (four) times daily as needed (rash). , Disp: , Rfl:  .  ondansetron (ZOFRAN ODT) 4 MG disintegrating tablet, Take 1 tablet (4 mg total) by mouth every 8 (eight) hours as needed for nausea or vomiting., Disp: 20 tablet, Rfl: 0 .  ONETOUCH VERIO test strip, daily., Disp: , Rfl:  .  OVER THE COUNTER MEDICATION, Apply 1 application topically daily as needed (pain). [place], Disp: , Rfl:  .  pregabalin (LYRICA) 100 MG capsule, Take 100 mg by mouth at bedtime as needed (leg cramps). , Disp: , Rfl:  .  Syringe/Needle, Disp, (SYRINGE 3CC/25GX1") 25G X 1" 3 ML MISC, Use for b12 injections, Disp: 50 each, Rfl: 0 .  traMADol (ULTRAM) 50 MG tablet, Take 0.5 tablets (25 mg total) by mouth every 12 (twelve) hours as needed for severe pain., Disp: 30 tablet, Rfl: 0 .  umeclidinium-vilanterol (ANORO ELLIPTA) 62.5-25 MCG/INH AEPB, Inhale 1 puff into the lungs daily., Disp: 1 each, Rfl: 12  Past Medical History: Past Medical History:  Diagnosis Date  . Abnormality of gait 03/25/2013  . Adrenal mass, left ([place])   . Anemia    iron deficient post  2 unit txfsn 2009, normal endo/colonoscopy by [facility]  . Arthritis   . Atypical chest pain    a. 04/1986 Cath ([facility]): nl cors, EF 65%; b. 07/2012  Cath [facility]): LM nl, LAD/LCX/RCA w/ min irregs; c. 07/2016 MV [facility]): "Equivocal" w/ small inf wall rev defect and hyperdynamic LV contraction, EF 87%; d. 08/2016 Cardiac CT Ca2+ score [facility]): Ca2+ score 1548; e. 05/2019 MV: No ischemia. Diaph atten. EF 75%.  . Cervical spinal stenosis 1994   due to trauma to back ([employer] accident), has intermittent paralysis and parasthesias  . Cervicogenic headache 03/23/2014  . Depression   . Diastolic dysfunction    a. 12/2019 Echo: EF 65-70%, Gr2 DD. No significant valvular dzs.  . Dizziness    chronic dizziness  . DJD (degenerative joint disease)    a. Chronic R shoulder pain pending R shoulder replacement 07/2017.  [name] Esophageal stenosis  March 2011   with transietn outlet obstruction by food, cleared by EGD   . Family history of adverse reaction to anesthesia    daughter PONV  . Gastric bypass status for obesity   . Headache(784.0)   . Hypertension   . IBS (irritable bowel syndrome)   . Left bundle branch block (LBBB)    a. Intermittently present - likely rate related. - patient denies  . Obesity   . Obstructive sleep apnea    using CPAP  . 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 Restless leg syndrome   . Rotator cuff arthropathy, right 08/13/2017  . Syncope and collapse 03/12/2014  . Type II diabetes mellitus (HCC)     Tobacco Use: Social History   Tobacco Use  Smoking Status Never Smoker  Smokeless Tobacco Never Used    Labs: Recent Review Flowsheet Data    Labs for ITP Cardiac and Pulmonary Rehab Latest Ref Rng & Units 02/24/2018 08/14/2018 03/06/2019 06/09/2019 12/31/2019   Cholestrol 0 - 200 mg/dL 193 - - - 183   LDLCALC 0 - 99 mg/dL 102(H) - - - 113(H)   LDLDIRECT mg/dL - - - - -   HDL >40 mg/dL 51.40 - - - 42   Trlycerides <150 mg/dL 197.0(H) - - - 140   Hemoglobin A1c 4.8 - 5.6 % 7.9(H) 6.5 7.4(H) 7.2(H) 7.7(H)   TCO2 22 - 32 mmol/L - - - - -       Exercise Target Goals: Exercise Program Goal: Individual exercise  prescription set using results from initial 6 min walk test and THRR while considering  patient's activity barriers and safety.   Exercise Prescription Goal: Initial exercise prescription builds to 30-45 minutes a day of aerobic activity, 2-3 days per week.  Home exercise guidelines will be given to patient during program as part of exercise prescription that the participant will acknowledge.   Education: Aerobic Exercise & Resistance Training: - Gives group verbal and written instruction on the various components of exercise. Focuses on aerobic and resistive training programs and the benefits of this training and how to safely progress through these programs..   Education: Exercise & Equipment Safety: - Individual verbal instruction and demonstration of equipment use and safety with use of the equipment.   Cardiac Rehab from 01/25/2020 in [facility] Cardiac and Pulmonary Rehab  Date 01/25/20  Educator AS  Instruction Review Code 1- Verbalizes Understanding      Education: Exercise Physiology & General Exercise Guidelines: - Group verbal and written instruction with models to review the exercise physiology of the cardiovascular system and associated critical values. Provides general exercise guidelines with specific guidelines to those with heart or lung disease.    Education: Flexibility, Balance, Mind/Body Relaxation: Provides group verbal/written instruction on the benefits of flexibility and balance training, including mind/body exercise modes such as yoga, pilates and tai chi.  Demonstration and skill practice provided.   Activity Barriers & Risk Stratification:  Activity Barriers & Cardiac Risk Stratification - 01/15/20 1411      Activity Barriers & Cardiac Risk Stratification   Activity Barriers Arthritis;Back Problems;Muscular Weakness;Neck/Spine Problems;Left Knee Replacement;Right Knee Replacement;Balance Concerns;Assistive Device;Joint Problems    Cardiac Risk Stratification High            6 Minute Walk:  6 Minute Walk    Row Name 01/25/20 1438         6 Minute Walk   Phase Initial     Distance 400 feet     Walk Time 5.5  minutes     # of Rest Breaks 0     MPH 0.75     METS 0.5     RPE 15     Perceived Dyspnea  2     VO2 Peak 1.81     Symptoms Yes (comment)     Comments R hip/buttock 3/10     Resting HR 73 bpm     Resting BP 108/68     Resting Oxygen Saturation  98 %     Exercise Oxygen Saturation  during 6 min walk 97 %     Max Ex. HR 102 bpm     Max Ex. BP 146/60     2 Minute Post BP 130/68            Oxygen Initial Assessment:   Oxygen Re-Evaluation:   Oxygen Discharge (Final Oxygen Re-Evaluation):   Initial Exercise Prescription:  Initial Exercise Prescription - 01/25/20 1400      Date of Initial Exercise RX and Referring Provider   Date 01/25/20    Referring Provider Arida (End)      Recumbant Bike   Level 1    RPM 60    Minutes 15      NuStep   Level 1    SPM 80    Minutes 15      Arm Ergometer   Level 1    RPM 25    Minutes 15           Perform Capillary Blood Glucose checks as needed.  Exercise Prescription Changes:  Exercise Prescription Changes    Row Name 01/25/20 1400             Response to Exercise   Blood Pressure (Admit) 108/68       Blood Pressure (Exercise) 146/66       Blood Pressure (Exit) 130/68       Heart Rate (Admit) 73 bpm       Heart Rate (Exercise) 102 bpm       Heart Rate (Exit) 74 bpm       Oxygen Saturation (Admit) 98 %       Oxygen Saturation (Exercise) 97 %       Rating of Perceived Exertion (Exercise) 15       Perceived Dyspnea (Exercise) 2       Symptoms R hip buttock 3/10              Exercise Comments:   Exercise Goals and Review:  Exercise Goals    Row Name 01/25/20 1454             Exercise Goals   Increase Physical Activity Yes       Intervention Provide advice, education, support and counseling about physical activity/exercise needs.;Develop an  individualized exercise prescription for aerobic and resistive training based on initial evaluation findings, risk stratification, comorbidities and participant's personal goals.       Expected Outcomes Short Term: Attend rehab on a regular basis to increase amount of physical activity.;Long Term: Add in home exercise to make exercise part of routine and to increase amount of physical activity.;Long Term: Exercising regularly at least 3-5 days a week.       Increase Strength and Stamina Yes       Intervention Provide advice, education, support and counseling about physical activity/exercise needs.;Develop an individualized exercise prescription for aerobic and resistive training based on initial evaluation findings, risk stratification, comorbidities and participant's personal goals.  Expected Outcomes Short Term: Increase workloads from initial exercise prescription for resistance, speed, and METs.;Short Term: Perform resistance training exercises routinely during rehab and add in resistance training at home;Long Term: Improve cardiorespiratory fitness, muscular endurance and strength as measured by increased METs and functional capacity (6MWT)       Able to understand and use rate of perceived exertion (RPE) scale Yes       Intervention Provide education and explanation on how to use RPE scale       Expected Outcomes Short Term: Able to use RPE daily in rehab to express subjective intensity level;Long Term:  Able to use RPE to guide intensity level when exercising independently       Knowledge and understanding of Target Heart Rate Range (THRR) Yes       Intervention Provide education and explanation of THRR including how the numbers were predicted and where they are located for reference       Expected Outcomes Short Term: Able to state/look up THRR;Short Term: Able to use daily as guideline for intensity in rehab;Long Term: Able to use THRR to govern intensity when exercising independently        Able to check pulse independently Yes       Intervention Provide education and demonstration on how to check pulse in carotid and radial arteries.;Review the importance of being able to check your own pulse for safety during independent exercise       Expected Outcomes Short Term: Able to explain why pulse checking is important during independent exercise;Long Term: Able to check pulse independently and accurately       Understanding of Exercise Prescription Yes       Intervention Provide education, explanation, and written materials on patient's individual exercise prescription       Expected Outcomes Short Term: Able to explain program exercise prescription;Long Term: Able to explain home exercise prescription to exercise independently              Exercise Goals Re-Evaluation :   Discharge Exercise Prescription (Final Exercise Prescription Changes):  Exercise Prescription Changes - 01/25/20 1400      Response to Exercise   Blood Pressure (Admit) 108/68    Blood Pressure (Exercise) 146/66    Blood Pressure (Exit) 130/68    Heart Rate (Admit) 73 bpm    Heart Rate (Exercise) 102 bpm    Heart Rate (Exit) 74 bpm    Oxygen Saturation (Admit) 98 %    Oxygen Saturation (Exercise) 97 %    Rating of Perceived Exertion (Exercise) 15    Perceived Dyspnea (Exercise) 2    Symptoms R hip buttock 3/10           Nutrition:  Target Goals: Understanding of nutrition guidelines, daily intake of sodium <1583m, cholesterol <2022m calories 30% from fat and 7% or less from saturated fats, daily to have 5 or more servings of fruits and vegetables.  Education: Controlling Sodium/Reading Food Labels -Group verbal and written material supporting the discussion of sodium use in heart healthy nutrition. Review and explanation with models, verbal and written materials for utilization of the food label.   Education: General Nutrition Guidelines/Fats and Fiber: -Group instruction provided by verbal,  written material, models and posters to present the general guidelines for heart healthy nutrition. Gives an explanation and review of dietary fats and fiber.   Biometrics:  Pre Biometrics - 01/25/20 1455      Pre Biometrics   Height '5\' 5"'  (1.651 m)  Weight 241 lb 3.2 oz (109.4 kg)    BMI (Calculated) 40.14            Nutrition Therapy Plan and Nutrition Goals:   Nutrition Assessments:  Nutrition Assessments - 01/25/20 1500      Rate Your Plate Scores   Pre Score 41           MEDIFICTS Score Key:          ?70 Need to make dietary changes          40-70 Heart Healthy Diet         ? 40 Therapeutic Level Cholesterol Diet  Nutrition Goals Re-Evaluation:   Nutrition Goals Discharge (Final Nutrition Goals Re-Evaluation):   Psychosocial: Target Goals: Acknowledge presence or absence of significant depression and/or stress, maximize coping skills, provide positive support system. Participant is able to verbalize types and ability to use techniques and skills needed for reducing stress and depression.   Education: Depression - Provides group verbal and written instruction on the correlation between heart/lung disease and depressed mood, treatment options, and the stigmas associated with seeking treatment.   Education: Sleep Hygiene -Provides group verbal and written instruction about how sleep can affect your health.  Define sleep hygiene, discuss sleep cycles and impact of sleep habits. Review good sleep hygiene tips.     Education: Stress and Anxiety: - Provides group verbal and written instruction about the health risks of elevated stress and causes of high stress.  Discuss the correlation between heart/lung disease and anxiety and treatment options. Review healthy ways to manage with stress and anxiety.    Initial Review & Psychosocial Screening:  Initial Psych Review & Screening - 01/15/20 1414      Initial Review   Current issues with Current Stress  Concerns    Source of Stress Concerns Chronic Illness      Family Dynamics   Good Support System? Yes   husband, daughter     Barriers   Psychosocial barriers to participate in program There are no identifiable barriers or psychosocial needs.;The patient should benefit from training in stress management and relaxation.      Screening Interventions   Interventions Encouraged to exercise;To provide support and resources with identified psychosocial needs;Provide feedback about the scores to participant    Expected Outcomes Short Term goal: Utilizing psychosocial counselor, staff and physician to assist with identification of specific Stressors or current issues interfering with healing process. Setting desired goal for each stressor or current issue identified.;Long Term Goal: Stressors or current issues are controlled or eliminated.;Short Term goal: Identification and review with participant of any Quality of Life or Depression concerns found by scoring the questionnaire.;Long Term goal: The participant improves quality of Life and PHQ9 Scores as seen by post scores and/or verbalization of changes           Quality of Life Scores:   Quality of Life - 01/25/20 1500      Quality of Life   Select Quality of Life      Quality of Life Scores   Health/Function Pre 14.47 %    Socioeconomic Pre 18.25 %    Psych/Spiritual Pre 23.43 %    Family Pre 26.4 %    GLOBAL Pre 18.86 %          Scores of 19 and below usually indicate a poorer quality of life in these areas.  A difference of  2-3 points is a clinically meaningful difference.  A difference of 2-3 points  in the total score of the Quality of Life Index has been associated with significant improvement in overall quality of life, self-image, physical symptoms, and general health in studies assessing change in quality of life.  PHQ-9: Recent Review Flowsheet Data    Depression screen [facility] 2/9 01/25/2020 01/13/2020 06/24/2019 05/28/2019  02/02/2019   Decreased Interest 0 0 0 0 0   Down, Depressed, Hopeless 0 0 0 0 0   PHQ - 2 Score 0 0 0 0 0   Altered sleeping 2  - - - 0   Tired, decreased energy 3  - - - 0   Change in appetite 0 - - - 0   Feeling bad or failure about yourself  0 - - - 0   Trouble concentrating 0 - - - 0   Moving slowly or fidgety/restless 3 - - - 0   Suicidal thoughts 0 - - - 0   PHQ-9 Score 8 - - - 0   Difficult doing work/chores Extremely dIfficult - - - Not difficult at all     Interpretation of Total Score  Total Score Depression Severity:  1-4 = Minimal depression, 5-9 = Mild depression, 10-14 = Moderate depression, 15-19 = Moderately severe depression, 20-27 = Severe depression   Psychosocial Evaluation and Intervention:  Psychosocial Evaluation - 01/15/20 1415      Psychosocial Evaluation & Interventions   Interventions Encouraged to exercise with the program and follow exercise prescription    Comments Siona has a long history of health issues. She is curious to see what she can do during cardiac rehab given her history of mobility issues. She wants to improve her stamina and balance. She reports a good support system of her husband and her daughter.    Expected Outcomes Short: attend cardiac rehab for exercise and education. Long: develop positive self care habits.    Continue Psychosocial Services  Follow up required by staff           Psychosocial Re-Evaluation:   Psychosocial Discharge (Final Psychosocial Re-Evaluation):   Vocational Rehabilitation: Provide vocational rehab assistance to qualifying candidates.   Vocational Rehab Evaluation & Intervention:  Vocational Rehab - 01/15/20 1414      Initial Vocational Rehab Evaluation & Intervention   Assessment shows need for Vocational Rehabilitation No           Education: Education Goals: Education classes will be provided on a variety of topics geared toward better understanding of heart health and risk factor  modification. Participant will state understanding/return demonstration of topics presented as noted by education test scores.  Learning Barriers/Preferences:  Learning Barriers/Preferences - 01/15/20 1414      Learning Barriers/Preferences   Learning Barriers None    Learning Preferences None           General Cardiac Education Topics:  AED/CPR: - Group verbal and written instruction with the use of models to demonstrate the basic use of the AED with the basic ABC's of resuscitation.   Anatomy & Physiology of the Heart: - Group verbal and written instruction and models provide basic cardiac anatomy and physiology, with the coronary electrical and arterial systems. Review of Valvular disease and Heart Failure   Cardiac Procedures: - Group verbal and written instruction to review commonly prescribed medications for heart disease. Reviews the medication, class of the drug, and side effects. Includes the steps to properly store meds and maintain the prescription regimen. (beta blockers and nitrates)   Cardiac Medications I: - Group verbal and  written instruction to review commonly prescribed medications for heart disease. Reviews the medication, class of the drug, and side effects. Includes the steps to properly store meds and maintain the prescription regimen.   Cardiac Medications II: -Group verbal and written instruction to review commonly prescribed medications for heart disease. Reviews the medication, class of the drug, and side effects. (all other drug classes)    Go Sex-Intimacy & Heart Disease, Get SMART - Goal Setting: - Group verbal and written instruction through game format to discuss heart disease and the return to sexual intimacy. Provides group verbal and written material to discuss and apply goal setting through the application of the S.M.A.R.T. Method.   Other Matters of the Heart: - Provides group verbal, written materials and models to describe Stable Angina  and Peripheral Artery. Includes description of the disease process and treatment options available to the cardiac patient.   Infection Prevention: - Provides verbal and written material to individual with discussion of infection control including proper hand washing and proper equipment cleaning during exercise session.   Cardiac Rehab from 01/25/2020 in [facility] Cardiac and Pulmonary Rehab  Date 01/25/20  Educator AS  Instruction Review Code 1- Verbalizes Understanding      Falls Prevention: - Provides verbal and written material to individual with discussion of falls prevention and safety.   Cardiac Rehab from 01/25/2020 in [facility] Cardiac and Pulmonary Rehab  Date 01/25/20  Educator AS  Instruction Review Code 1- Verbalizes Understanding      Other: -Provides group and verbal instruction on various topics (see comments)   Knowledge Questionnaire Score:  Knowledge Questionnaire Score - 01/25/20 1500      Knowledge Questionnaire Score   Pre Score 21/26           Core Components/Risk Factors/Patient Goals at Admission:  Personal Goals and Risk Factors at Admission - 01/25/20 1504      Core Components/Risk Factors/Patient Goals on Admission    Weight Management Yes    Intervention Weight Management: Develop a combined nutrition and exercise program designed to reach desired caloric intake, while maintaining appropriate intake of nutrient and fiber, sodium and fats, and appropriate energy expenditure required for the weight goal.;Weight Management: Provide education and appropriate resources to help participant work on and attain dietary goals.;Weight Management/Obesity: Establish reasonable short term and long term weight goals.    Admit Weight 241 lb 3.2 oz (109.4 kg)    Goal Weight: Short Term 235 lb (106.6 kg)    Goal Weight: Long Term 220 lb (99.8 kg)    Expected Outcomes Short Term: Continue to assess and modify interventions until short term weight is achieved;Long Term:  Adherence to nutrition and physical activity/exercise program aimed toward attainment of established weight goal;Weight Loss: Understanding of general recommendations for a balanced deficit meal plan, which promotes 1-2 lb weight loss per week and includes a negative energy balance of [phone] kcal/d;Understanding recommendations for meals to include 15-35% energy as protein, 25-35% energy from fat, 35-60% energy from carbohydrates, less than 260m of dietary cholesterol, 20-35 gm of total fiber daily;Understanding of distribution of calorie intake throughout the day with the consumption of 4-5 meals/snacks    Intervention Provide education about signs/symptoms and action to take for hypo/hyperglycemia.;Provide education about proper nutrition, including hydration, and aerobic/resistive exercise prescription along with prescribed medications to achieve blood glucose in normal ranges: Fasting glucose 65-99 mg/dL    Expected Outcomes Short Term: Participant verbalizes understanding of the signs/symptoms and immediate care of hyper/hypoglycemia, proper foot care  and importance of medication, aerobic/resistive exercise and nutrition plan for blood glucose control.;Long Term: Attainment of HbA1C < 7%.    Intervention Provide education on lifestyle modifcations including regular physical activity/exercise, weight management, moderate sodium restriction and increased consumption of fresh fruit, vegetables, and low fat dairy, alcohol moderation, and smoking cessation.;Monitor prescription use compliance.    Expected Outcomes Short Term: Continued assessment and intervention until BP is < 140/50m HG in hypertensive participants. < 130/84mHG in hypertensive participants with diabetes, heart failure or chronic kidney disease.;Long Term: Maintenance of blood pressure at goal levels.           Education:Diabetes - Individual verbal and written instruction to review signs/symptoms of diabetes, desired ranges of  glucose level fasting, after meals and with exercise. Acknowledge that pre and post exercise glucose checks will be done for 3 sessions at entry of program.   [facility]  Date 01/15/20  Educator [facility] Review Code 1- VeUnited States Steel Corporationnderstanding      Education: Know Your Numbers and Risk Factors: -Group verbal and written instruction about important numbers in your health.  Discussion of what are risk factors and how they play a role in the disease process.  Review of Cholesterol, Blood Pressure, Diabetes, and BMI and the role they play in your overall health.   Core Components/Risk Factors/Patient Goals Review:    Core Components/Risk Factors/Patient Goals at Discharge (Final Review):    ITP Comments:  ITP Comments    Row Name 01/15/20 1430           ITP Comments Initial telephone orientation completed. [facility] EP orientation scheduled for 7/12 at 1pm              Comments: initial ITP

## 2020-01-25 NOTE — Patient Instructions (Addendum)
Patient Instructions  Patient Details  Name: Denise Sharp MRN: [id number] Date of Birth: [birth year] Referring Provider:  [name], MD  Below are your personal goals for exercise, nutrition, and risk factors. Our goal is to help you stay on track towards obtaining and maintaining these goals. We will be discussing your progress on these goals with you throughout the program.  Initial Exercise Prescription:  Initial Exercise Prescription - 01/25/20 1400      Date of Initial Exercise RX and Referring Provider   Date 01/25/20    Referring Provider Arida (End)      Recumbant Bike   Level 1    RPM 60    Minutes 15      NuStep   Level 1    SPM 80    Minutes 15      Arm Ergometer   Level 1    RPM 25    Minutes 15      Prescription Details   Frequency (times per week) 3    Duration Progress to 30 minutes of continuous aerobic without signs/symptoms of physical distress      Intensity   THRR 40-80% of Max Heartrate 102-131    Ratings of Perceived Exertion 11-15    Perceived Dyspnea 0-4      Resistance Training   Training Prescription Yes    Weight 3 lb    Reps 10-15           Exercise Goals: Frequency: Be able to perform aerobic exercise two to three times per week in program working toward 2-5 days per week of home exercise.  Intensity: Work with a perceived exertion of 11 (fairly light) - 15 (hard) while following your exercise prescription.  We will make changes to your prescription with you as you progress through the program.   Duration: Be able to do 30 to 45 minutes of continuous aerobic exercise in addition to a 5 minute warm-up and a 5 minute cool-down routine.   Nutrition Goals: Your personal nutrition goals will be established when you do your nutrition analysis with the dietician.  The following are general nutrition guidelines to follow: Cholesterol < 200mg /day Sodium < 1500mg /day Fiber: Women over 50 yrs - 21 grams per day  Personal  Goals:  Personal Goals and Risk Factors at Admission - 01/25/20 1504      Core Components/Risk Factors/Patient Goals on Admission    Weight Management Yes    Intervention Weight Management: Develop a combined nutrition and exercise program designed to reach desired caloric intake, while maintaining appropriate intake of nutrient and fiber, sodium and fats, and appropriate energy expenditure required for the weight goal.;Weight Management: Provide education and appropriate resources to help participant work on and attain dietary goals.;Weight Management/Obesity: Establish reasonable short term and long term weight goals.    Admit Weight 241 lb 3.2 oz (109.4 kg)    Goal Weight: Short Term 235 lb (106.6 kg)    Goal Weight: Long Term 220 lb (99.8 kg)    Expected Outcomes Short Term: Continue to assess and modify interventions until short term weight is achieved;Long Term: Adherence to nutrition and physical activity/exercise program aimed toward attainment of established weight goal;Weight Loss: Understanding of general recommendations for a balanced deficit meal plan, which promotes 1-2 lb weight loss per week and includes a negative energy balance of [phone] kcal/d;Understanding recommendations for meals to include 15-35% energy as protein, 25-35% energy from fat, 35-60% energy from carbohydrates, less than 200mg  of dietary cholesterol,  20-35 gm of total fiber daily;Understanding of distribution of calorie intake throughout the day with the consumption of 4-5 meals/snacks    Intervention Provide education about signs/symptoms and action to take for hypo/hyperglycemia.;Provide education about proper nutrition, including hydration, and aerobic/resistive exercise prescription along with prescribed medications to achieve blood glucose in normal ranges: Fasting glucose 65-99 mg/dL    Expected Outcomes Short Term: Participant verbalizes understanding of the signs/symptoms and immediate care of  hyper/hypoglycemia, proper foot care and importance of medication, aerobic/resistive exercise and nutrition plan for blood glucose control.;Long Term: Attainment of HbA1C < 7%.    Intervention Provide education on lifestyle modifcations including regular physical activity/exercise, weight management, moderate sodium restriction and increased consumption of fresh fruit, vegetables, and low fat dairy, alcohol moderation, and smoking cessation.;Monitor prescription use compliance.    Expected Outcomes Short Term: Continued assessment and intervention until BP is < 140/46mm HG in hypertensive participants. < 130/15mm HG in hypertensive participants with diabetes, heart failure or chronic kidney disease.;Long Term: Maintenance of blood pressure at goal levels.           Tobacco Use Initial Evaluation: Social History   Tobacco Use  Smoking Status Never Smoker  Smokeless Tobacco Never Used    Exercise Goals and Review:  Exercise Goals    Row Name 01/25/20 1454             Exercise Goals   Increase Physical Activity Yes       Intervention Provide advice, education, support and counseling about physical activity/exercise needs.;Develop an individualized exercise prescription for aerobic and resistive training based on initial evaluation findings, risk stratification, comorbidities and participant's personal goals.       Expected Outcomes Short Term: Attend rehab on a regular basis to increase amount of physical activity.;Long Term: Add in home exercise to make exercise part of routine and to increase amount of physical activity.;Long Term: Exercising regularly at least 3-5 days a week.       Increase Strength and Stamina Yes       Intervention Provide advice, education, support and counseling about physical activity/exercise needs.;Develop an individualized exercise prescription for aerobic and resistive training based on initial evaluation findings, risk stratification, comorbidities and  participant's personal goals.       Expected Outcomes Short Term: Increase workloads from initial exercise prescription for resistance, speed, and METs.;Short Term: Perform resistance training exercises routinely during rehab and add in resistance training at home;Long Term: Improve cardiorespiratory fitness, muscular endurance and strength as measured by increased METs and functional capacity (6MWT)       Able to understand and use rate of perceived exertion (RPE) scale Yes       Intervention Provide education and explanation on how to use RPE scale       Expected Outcomes Short Term: Able to use RPE daily in rehab to express subjective intensity level;Long Term:  Able to use RPE to guide intensity level when exercising independently       Knowledge and understanding of Target Heart Rate Range (THRR) Yes       Intervention Provide education and explanation of THRR including how the numbers were predicted and where they are located for reference       Expected Outcomes Short Term: Able to state/look up THRR;Short Term: Able to use daily as guideline for intensity in rehab;Long Term: Able to use THRR to govern intensity when exercising independently       Able to check pulse independently Yes  Intervention Provide education and demonstration on how to check pulse in carotid and radial arteries.;Review the importance of being able to check your own pulse for safety during independent exercise       Expected Outcomes Short Term: Able to explain why pulse checking is important during independent exercise;Long Term: Able to check pulse independently and accurately       Understanding of Exercise Prescription Yes       Intervention Provide education, explanation, and written materials on patient's individual exercise prescription       Expected Outcomes Short Term: Able to explain program exercise prescription;Long Term: Able to explain home exercise prescription to exercise independently               Copy of goals given to participant.

## 2020-01-26 ENCOUNTER — Telehealth: Payer: Self-pay | Admitting: Internal Medicine

## 2020-01-26 DIAGNOSIS — D509 Iron deficiency anemia, unspecified: Secondary | ICD-10-CM

## 2020-01-26 NOTE — Telephone Encounter (Signed)
Labs reviewed,  Still need some work on diabetes control,  So please schedule a one month follow up And needs to make sure she is getting at least 60 ounces of water daily.  anemia slightly worse.  Resume iron supplement , and  Needs to pick up a FOBT to do at home

## 2020-01-27 ENCOUNTER — Other Ambulatory Visit: Payer: Self-pay

## 2020-01-27 ENCOUNTER — Encounter: Payer: Self-pay | Admitting: *Deleted

## 2020-01-27 ENCOUNTER — Encounter: Payer: Medicare Other | Admitting: *Deleted

## 2020-01-27 DIAGNOSIS — G4733 Obstructive sleep apnea (adult) (pediatric): Secondary | ICD-10-CM | POA: Diagnosis not present

## 2020-01-27 DIAGNOSIS — Z955 Presence of coronary angioplasty implant and graft: Secondary | ICD-10-CM

## 2020-01-27 DIAGNOSIS — F329 Major depressive disorder, single episode, unspecified: Secondary | ICD-10-CM | POA: Diagnosis not present

## 2020-01-27 DIAGNOSIS — G2581 Restless legs syndrome: Secondary | ICD-10-CM | POA: Diagnosis not present

## 2020-01-27 DIAGNOSIS — I10 Essential (primary) hypertension: Secondary | ICD-10-CM | POA: Diagnosis not present

## 2020-01-27 DIAGNOSIS — I447 Left bundle-branch block, unspecified: Secondary | ICD-10-CM | POA: Diagnosis not present

## 2020-01-27 LAB — GLUCOSE, CAPILLARY
Glucose-Capillary: 105 mg/dL — ABNORMAL HIGH (ref 70–99)
Glucose-Capillary: 136 mg/dL — ABNORMAL HIGH (ref 70–99)
Glucose-Capillary: 150 mg/dL — ABNORMAL HIGH (ref 70–99)

## 2020-01-27 NOTE — Progress Notes (Signed)
Daily Session Note  Patient Details  Name: Denise Sharp MRN: [id number] Date of Birth: [birth year] Referring Provider:     Cardiac Rehab from 01/25/2020 in [facility] Cardiac [facility]  Referring Provider Arida (End)      Encounter Date: 01/27/2020  Check In:  Session Check In - 01/27/20 1418      Check-In   Supervising physician immediately available to respond to emergencies See telemetry face sheet for immediately available ER MD    Location [facility]-Cardiac & Pulmonary Rehab    Staff Present Renita Papa, RN BSN;Joseph Lou Miner, [place] Exercise Physiologist    Virtual Visit No    Medication changes reported     No    Fall or balance concerns reported    No    Warm-up and Cool-down Performed on first and last piece of equipment    Resistance Training Performed Yes    VAD Patient? No    PAD/SET Patient? No      Pain Assessment   Currently in Pain? No/denies              Social History   Tobacco Use  Smoking Status Never Smoker  Smokeless Tobacco Never Used    Goals Met:  Independence with exercise equipment Exercise tolerated well No report of cardiac concerns or symptoms Strength training completed today  Goals Unmet:  Not Applicable  Comments: First full day of exercise!  Patient was oriented to gym and equipment including functions, settings, policies, and procedures.  Patient's individual exercise prescription and treatment plan were reviewed.  All starting workloads were established based on the results of the 6 minute walk test done at initial orientation visit.  The plan for exercise progression was also introduced and progression will be customized based on patient's performance and goals.    Dr. Emily Filbert is Medical Director for [place] and [facility].

## 2020-01-27 NOTE — Progress Notes (Signed)
Cardiac Individual Treatment Plan  Patient Details  Name: Denise Sharp MRN: [id number] Date of Birth: 01-Jun-1946 Referring Provider:     Cardiac Rehab from 01/25/2020 in [facility] Cardiac and Pulmonary Rehab  Referring Provider Arida (End)      Initial Encounter Date:    Cardiac Rehab from 01/25/2020 in [facility] Cardiac and Pulmonary Rehab  Date 01/25/20      Visit Diagnosis: Status post coronary artery stent placement  Patient's Home Medications on Admission:  Current Outpatient Medications:  .  albuterol (VENTOLIN HFA) 108 (90 Base) MCG/ACT inhaler, Inhale 2 puffs into the lungs every 8 (eight) hours as needed for wheezing or shortness of breath., Disp: 8 g, Rfl: 2 .  aspirin EC 81 MG tablet, Take 1 tablet (81 mg total) by mouth daily., Disp: 30 tablet, Rfl: 2 .  atorvastatin (LIPITOR) 40 MG tablet, Take 1 tablet (40 mg total) by mouth daily., Disp: 30 tablet, Rfl: 0 .  benzonatate (TESSALON) 200 MG capsule, Take 1 capsule (200 mg total) by mouth 2 (two) times daily as needed for cough., Disp: 30 capsule, Rfl: 5 .  blood glucose meter kit and supplies, Use to check sugars up to three times daily. Dx code: E11.9, Disp: 1 each, Rfl: 0 .  Blood Glucose Monitoring Suppl (ONETOUCH VERIO) w/Device KIT, 1 Device by Does not apply route daily. Use to test blood glucose once daily. Dx code = E11.9, Disp: 1 kit, Rfl: 0 .  buPROPion (WELLBUTRIN) 75 MG tablet, TAKE 1-2 TABLETS (75-150 MG TOTAL) BY MOUTH SEE ADMIN INSTRUCTIONS. TAKE 2 TABLETS IN THE MORNING AND 1 TABLET IN THE AFTERNOON (Patient taking differently: 75 mg at bedtime. ), Disp: 270 tablet, Rfl: 1 .  butalbital-acetaminophen-caffeine (FIORICET) 50-325-40 MG tablet, Take 1 tablet by mouth every 6 (six) hours as needed for headache., Disp: 90 tablet, Rfl: 5 .  citalopram (CELEXA) 10 MG tablet, Take 1 tablet (10 mg total) by mouth daily., Disp: 90 tablet, Rfl: 1 .  clopidogrel (PLAVIX) 75 MG tablet, Take 1 tablet (75 mg total) by mouth daily.,  Disp: 30 tablet, Rfl: 0 .  clotrimazole-betamethasone (LOTRISONE) cream, Apply 1 application topically 2 (two) times daily. (Patient taking differently: Apply 1 application topically 2 (two) times daily as needed (rash). ), Disp: 45 g, Rfl: 3 .  Continuous Blood Gluc Receiver (FREESTYLE LIBRE 14 DAY READER) DEVI, 1 applicator by Does not apply route 4 (four) times daily., Disp: 1 each, Rfl: 6 .  Continuous Blood Gluc Sensor (FREESTYLE LIBRE 14 DAY SENSOR) MISC, Use to test blood sugar 4 times daily, Disp: 1 each, Rfl: 6 .  cyanocobalamin (,VITAMIN B-12,) 1000 MCG/ML injection, Inject 1 ml (1000 mcg ) IM weekly x 4,  Then monthly thereafter (Patient taking differently: Inject 1,000 mcg into the muscle every 30 (thirty) days. On the 12th day), Disp: 110 mL, Rfl: 0 .  diazepam (VALIUM) 5 MG tablet, Take 1 tablet (5 mg total) by mouth at bedtime as needed for anxiety., Disp: 30 tablet, Rfl: 5 .  dicyclomine (BENTYL) 10 MG capsule, Take 1 capsule (10 mg total) by mouth 4 (four) times daily -  before meals and at bedtime., Disp: 360 capsule, Rfl: 3 .  diphenhydrAMINE (BENADRYL) 25 MG tablet, Take 50 mg by mouth daily as needed for allergies., Disp: , Rfl:  .  diphenoxylate-atropine (LOMOTIL) 2.5-0.025 MG tablet, Take 1 tablet by mouth 4 (four) times daily as needed for diarrhea or loose stools., Disp: 60 tablet, Rfl: 0 .  Eszopiclone 3  MG TABS, Take 1 tablet (3 mg total) by mouth at bedtime., Disp: 90 tablet, Rfl: 1 .  Insulin Glargine (LANTUS SOLOSTAR) 100 UNIT/ML Solostar Pen, Inject 11 Units into the skin daily. (Patient taking differently: Inject 10-15 Units into the skin daily. ), Disp: 5 pen, Rfl: 3 .  Insulin Pen Needle (B-D UF III MINI PEN NEEDLES) 31G X 5 MM MISC, USE TO INJECT INSULIN DAILY, Disp: 100 each, Rfl: 3 .  ipratropium (ATROVENT) 0.06 % nasal spray, Place 2 sprays into both nostrils 4 (four) times daily., Disp: 15 mL, Rfl: 12 .  Lancet Devices (ONE TOUCH DELICA LANCING DEV) MISC, Use to  test blood glucose once daily. Dx code = E11.9, Disp: 1 each, Rfl: 0 .  levETIRAcetam (KEPPRA) 750 MG tablet, Take 1 tablet (750 mg total) by mouth at bedtime. (Patient taking differently: Take 750 mg by mouth at bedtime as needed (restless legs). ), Disp: 90 tablet, Rfl: 3 .  nitroGLYCERIN (NITROSTAT) 0.4 MG SL tablet, Place 1 tablet (0.4 mg total) under the tongue every 5 (five) minutes as needed for chest pain., Disp: 20 tablet, Rfl: 1 .  NONFORMULARY OR COMPOUNDED ITEM, [facility]  Combination Pain Cream -  Baclofen 2%, Doxepin 5%, Gabapentin 6%, Topiramate 2%, Pentoxifylline 3% Apply 1-2 grams to affected area 3-4 times daily Qty. 120 gm 3 refills, Disp: 1 each, Rfl: 3 .  nystatin (NYAMYC) powder, Apply 1 g topically 4 (four) times daily as needed (rash). , Disp: , Rfl:  .  ondansetron (ZOFRAN ODT) 4 MG disintegrating tablet, Take 1 tablet (4 mg total) by mouth every 8 (eight) hours as needed for nausea or vomiting., Disp: 20 tablet, Rfl: 0 .  ONETOUCH VERIO test strip, daily., Disp: , Rfl:  .  OVER THE COUNTER MEDICATION, Apply 1 application topically daily as needed (pain). [place], Disp: , Rfl:  .  pregabalin (LYRICA) 100 MG capsule, Take 100 mg by mouth at bedtime as needed (leg cramps). , Disp: , Rfl:  .  Syringe/Needle, Disp, (SYRINGE 3CC/25GX1") 25G X 1" 3 ML MISC, Use for b12 injections, Disp: 50 each, Rfl: 0 .  traMADol (ULTRAM) 50 MG tablet, Take 0.5 tablets (25 mg total) by mouth every 12 (twelve) hours as needed for severe pain., Disp: 30 tablet, Rfl: 0 .  umeclidinium-vilanterol (ANORO ELLIPTA) 62.5-25 MCG/INH AEPB, Inhale 1 puff into the lungs daily., Disp: 1 each, Rfl: 12  Past Medical History: Past Medical History:  Diagnosis Date  . Abnormality of gait 03/25/2013  . Adrenal mass, left ([place])   . Anemia    iron deficient post  2 unit txfsn 2009, normal endo/colonoscopy by [facility]  . Arthritis   . Atypical chest pain    a. 04/1986 Cath ([facility]): nl cors, EF 65%; b. 07/2012  Cath [facility]): LM nl, LAD/LCX/RCA w/ min irregs; c. 07/2016 MV [facility]): "Equivocal" w/ small inf wall rev defect and hyperdynamic LV contraction, EF 87%; d. 08/2016 Cardiac CT Ca2+ score [facility]): Ca2+ score 1548; e. 05/2019 MV: No ischemia. Diaph atten. EF 75%.  . Cervical spinal stenosis 1994   due to trauma to back ([employer] accident), has intermittent paralysis and parasthesias  . Cervicogenic headache 03/23/2014  . Depression   . Diastolic dysfunction    a. 12/2019 Echo: EF 65-70%, Gr2 DD. No significant valvular dzs.  . Dizziness    chronic dizziness  . DJD (degenerative joint disease)    a. Chronic R shoulder pain pending R shoulder replacement 07/2017.  [name] Esophageal stenosis  March 2011   with transietn outlet obstruction by food, cleared by EGD   . Family history of adverse reaction to anesthesia    daughter PONV  . Gastric bypass status for obesity   . Headache(784.0)   . Hypertension   . IBS (irritable bowel syndrome)   . Left bundle branch block (LBBB)    a. Intermittently present - likely rate related. - patient denies  . Obesity   . Obstructive sleep apnea    using CPAP  . 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 Restless leg syndrome   . Rotator cuff arthropathy, right 08/13/2017  . Syncope and collapse 03/12/2014  . Type II diabetes mellitus (HCC)     Tobacco Use: Social History   Tobacco Use  Smoking Status Never Smoker  Smokeless Tobacco Never Used    Labs: Recent Review Flowsheet Data    Labs for ITP Cardiac and Pulmonary Rehab Latest Ref Rng & Units 08/14/2018 03/06/2019 06/09/2019 12/31/2019 01/25/2020   Cholestrol 0 - 200 mg/dL - - - 183 -   LDLCALC 0 - 99 mg/dL - - - 113(H) -   LDLDIRECT mg/dL - - - - -   HDL >40 mg/dL - - - 42 -   Trlycerides <150 mg/dL - - - 140 -   Hemoglobin A1c 4.8 - 5.6 % 6.5 7.4(H) 7.2(H) 7.7(H) 7.6(H)   TCO2 22 - 32 mmol/L - - - - -       Exercise Target Goals: Exercise Program Goal: Individual exercise prescription set using  results from initial 6 min walk test and THRR while considering  patient's activity barriers and safety.   Exercise Prescription Goal: Initial exercise prescription builds to 30-45 minutes a day of aerobic activity, 2-3 days per week.  Home exercise guidelines will be given to patient during program as part of exercise prescription that the participant will acknowledge.   Education: Aerobic Exercise & Resistance Training: - Gives group verbal and written instruction on the various components of exercise. Focuses on aerobic and resistive training programs and the benefits of this training and how to safely progress through these programs..   Education: Exercise & Equipment Safety: - Individual verbal instruction and demonstration of equipment use and safety with use of the equipment.   Cardiac Rehab from 01/25/2020 in [facility] Cardiac and Pulmonary Rehab  Date 01/25/20  Educator AS  Instruction Review Code 1- Verbalizes Understanding      Education: Exercise Physiology & General Exercise Guidelines: - Group verbal and written instruction with models to review the exercise physiology of the cardiovascular system and associated critical values. Provides general exercise guidelines with specific guidelines to those with heart or lung disease.    Education: Flexibility, Balance, Mind/Body Relaxation: Provides group verbal/written instruction on the benefits of flexibility and balance training, including mind/body exercise modes such as yoga, pilates and tai chi.  Demonstration and skill practice provided.   Activity Barriers & Risk Stratification:  Activity Barriers & Cardiac Risk Stratification - 01/15/20 1411      Activity Barriers & Cardiac Risk Stratification   Activity Barriers Arthritis;Back Problems;Muscular Weakness;Neck/Spine Problems;Left Knee Replacement;Right Knee Replacement;Balance Concerns;Assistive Device;Joint Problems    Cardiac Risk Stratification High           6  Minute Walk:  6 Minute Walk    Row Name 01/25/20 1438         6 Minute Walk   Phase Initial     Distance 400 feet     Walk Time 5.5  minutes     # of Rest Breaks 0     MPH 0.75     METS 0.5     RPE 15     Perceived Dyspnea  2     VO2 Peak 1.81     Symptoms Yes (comment)     Comments R hip/buttock 3/10     Resting HR 73 bpm     Resting BP 108/68     Resting Oxygen Saturation  98 %     Exercise Oxygen Saturation  during 6 min walk 97 %     Max Ex. HR 102 bpm     Max Ex. BP 146/60     2 Minute Post BP 130/68            Oxygen Initial Assessment:   Oxygen Re-Evaluation:   Oxygen Discharge (Final Oxygen Re-Evaluation):   Initial Exercise Prescription:  Initial Exercise Prescription - 01/25/20 1400      Date of Initial Exercise RX and Referring Provider   Date 01/25/20    Referring Provider Arida (End)      Recumbant Bike   Level 1    RPM 60    Minutes 15      NuStep   Level 1    SPM 80    Minutes 15      Arm Ergometer   Level 1    RPM 25    Minutes 15      Prescription Details   Frequency (times per week) 3    Duration Progress to 30 minutes of continuous aerobic without signs/symptoms of physical distress      Intensity   THRR 40-80% of Max Heartrate 102-131    Ratings of Perceived Exertion 11-15    Perceived Dyspnea 0-4      Resistance Training   Training Prescription Yes    Weight 3 lb    Reps 10-15           Perform Capillary Blood Glucose checks as needed.  Exercise Prescription Changes:  Exercise Prescription Changes    Row Name 01/25/20 1400             Response to Exercise   Blood Pressure (Admit) 108/68       Blood Pressure (Exercise) 146/66       Blood Pressure (Exit) 130/68       Heart Rate (Admit) 73 bpm       Heart Rate (Exercise) 102 bpm       Heart Rate (Exit) 74 bpm       Oxygen Saturation (Admit) 98 %       Oxygen Saturation (Exercise) 97 %       Rating of Perceived Exertion (Exercise) 15       Perceived  Dyspnea (Exercise) 2       Symptoms R hip buttock 3/10              Exercise Comments:   Exercise Goals and Review:  Exercise Goals    Row Name 01/25/20 1454             Exercise Goals   Increase Physical Activity Yes       Intervention Provide advice, education, support and counseling about physical activity/exercise needs.;Develop an individualized exercise prescription for aerobic and resistive training based on initial evaluation findings, risk stratification, comorbidities and participant's personal goals.       Expected Outcomes Short Term: Attend rehab on a regular basis to increase amount of physical activity.;Long  Term: Add in home exercise to make exercise part of routine and to increase amount of physical activity.;Long Term: Exercising regularly at least 3-5 days a week.       Increase Strength and Stamina Yes       Intervention Provide advice, education, support and counseling about physical activity/exercise needs.;Develop an individualized exercise prescription for aerobic and resistive training based on initial evaluation findings, risk stratification, comorbidities and participant's personal goals.       Expected Outcomes Short Term: Increase workloads from initial exercise prescription for resistance, speed, and METs.;Short Term: Perform resistance training exercises routinely during rehab and add in resistance training at home;Long Term: Improve cardiorespiratory fitness, muscular endurance and strength as measured by increased METs and functional capacity (6MWT)       Able to understand and use rate of perceived exertion (RPE) scale Yes       Intervention Provide education and explanation on how to use RPE scale       Expected Outcomes Short Term: Able to use RPE daily in rehab to express subjective intensity level;Long Term:  Able to use RPE to guide intensity level when exercising independently       Knowledge and understanding of Target Heart Rate Range (THRR) Yes        Intervention Provide education and explanation of THRR including how the numbers were predicted and where they are located for reference       Expected Outcomes Short Term: Able to state/look up THRR;Short Term: Able to use daily as guideline for intensity in rehab;Long Term: Able to use THRR to govern intensity when exercising independently       Able to check pulse independently Yes       Intervention Provide education and demonstration on how to check pulse in carotid and radial arteries.;Review the importance of being able to check your own pulse for safety during independent exercise       Expected Outcomes Short Term: Able to explain why pulse checking is important during independent exercise;Long Term: Able to check pulse independently and accurately       Understanding of Exercise Prescription Yes       Intervention Provide education, explanation, and written materials on patient's individual exercise prescription       Expected Outcomes Short Term: Able to explain program exercise prescription;Long Term: Able to explain home exercise prescription to exercise independently              Exercise Goals Re-Evaluation :   Discharge Exercise Prescription (Final Exercise Prescription Changes):  Exercise Prescription Changes - 01/25/20 1400      Response to Exercise   Blood Pressure (Admit) 108/68    Blood Pressure (Exercise) 146/66    Blood Pressure (Exit) 130/68    Heart Rate (Admit) 73 bpm    Heart Rate (Exercise) 102 bpm    Heart Rate (Exit) 74 bpm    Oxygen Saturation (Admit) 98 %    Oxygen Saturation (Exercise) 97 %    Rating of Perceived Exertion (Exercise) 15    Perceived Dyspnea (Exercise) 2    Symptoms R hip buttock 3/10           Nutrition:  Target Goals: Understanding of nutrition guidelines, daily intake of sodium <1530m, cholesterol <2027m calories 30% from fat and 7% or less from saturated fats, daily to have 5 or more servings of fruits and  vegetables.  Education: Controlling Sodium/Reading Food Labels -Group verbal and written material supporting the discussion of sodium  use in heart healthy nutrition. Review and explanation with models, verbal and written materials for utilization of the food label.   Education: General Nutrition Guidelines/Fats and Fiber: -Group instruction provided by verbal, written material, models and posters to present the general guidelines for heart healthy nutrition. Gives an explanation and review of dietary fats and fiber.   Biometrics:  Pre Biometrics - 01/25/20 1455      Pre Biometrics   Height '5\' 5"'  (1.651 m)    Weight 241 lb 3.2 oz (109.4 kg)    BMI (Calculated) 40.14            Nutrition Therapy Plan and Nutrition Goals:   Nutrition Assessments:  Nutrition Assessments - 01/25/20 1500      Rate Your Plate Scores   Pre Score 41           MEDIFICTS Score Key:          ?70 Need to make dietary changes          40-70 Heart Healthy Diet         ? 40 Therapeutic Level Cholesterol Diet  Nutrition Goals Re-Evaluation:   Nutrition Goals Discharge (Final Nutrition Goals Re-Evaluation):   Psychosocial: Target Goals: Acknowledge presence or absence of significant depression and/or stress, maximize coping skills, provide positive support system. Participant is able to verbalize types and ability to use techniques and skills needed for reducing stress and depression.   Education: Depression - Provides group verbal and written instruction on the correlation between heart/lung disease and depressed mood, treatment options, and the stigmas associated with seeking treatment.   Education: Sleep Hygiene -Provides group verbal and written instruction about how sleep can affect your health.  Define sleep hygiene, discuss sleep cycles and impact of sleep habits. Review good sleep hygiene tips.     Education: Stress and Anxiety: - Provides group verbal and written instruction about  the health risks of elevated stress and causes of high stress.  Discuss the correlation between heart/lung disease and anxiety and treatment options. Review healthy ways to manage with stress and anxiety.    Initial Review & Psychosocial Screening:  Initial Psych Review & Screening - 01/15/20 1414      Initial Review   Current issues with Current Stress Concerns    Source of Stress Concerns Chronic Illness      Family Dynamics   Good Support System? Yes   husband, daughter     Barriers   Psychosocial barriers to participate in program There are no identifiable barriers or psychosocial needs.;The patient should benefit from training in stress management and relaxation.      Screening Interventions   Interventions Encouraged to exercise;To provide support and resources with identified psychosocial needs;Provide feedback about the scores to participant    Expected Outcomes Short Term goal: Utilizing psychosocial counselor, staff and physician to assist with identification of specific Stressors or current issues interfering with healing process. Setting desired goal for each stressor or current issue identified.;Long Term Goal: Stressors or current issues are controlled or eliminated.;Short Term goal: Identification and review with participant of any Quality of Life or Depression concerns found by scoring the questionnaire.;Long Term goal: The participant improves quality of Life and PHQ9 Scores as seen by post scores and/or verbalization of changes           Quality of Life Scores:   Quality of Life - 01/25/20 1500      Quality of Life   Select Quality of Life  Quality of Life Scores   Health/Function Pre 14.47 %    Socioeconomic Pre 18.25 %    Psych/Spiritual Pre 23.43 %    Family Pre 26.4 %    GLOBAL Pre 18.86 %          Scores of 19 and below usually indicate a poorer quality of life in these areas.  A difference of  2-3 points is a clinically meaningful difference.  A  difference of 2-3 points in the total score of the Quality of Life Index has been associated with significant improvement in overall quality of life, self-image, physical symptoms, and general health in studies assessing change in quality of life.  PHQ-9: Recent Review Flowsheet Data    Depression screen [facility] 2/9 01/25/2020 01/13/2020 06/24/2019 05/28/2019 02/02/2019   Decreased Interest 0 0 0 0 0   Down, Depressed, Hopeless 0 0 0 0 0   PHQ - 2 Score 0 0 0 0 0   Altered sleeping 2  - - - 0   Tired, decreased energy 3  - - - 0   Change in appetite 0 - - - 0   Feeling bad or failure about yourself  0 - - - 0   Trouble concentrating 0 - - - 0   Moving slowly or fidgety/restless 3 - - - 0   Suicidal thoughts 0 - - - 0   PHQ-9 Score 8 - - - 0   Difficult doing work/chores Extremely dIfficult - - - Not difficult at all     Interpretation of Total Score  Total Score Depression Severity:  1-4 = Minimal depression, 5-9 = Mild depression, 10-14 = Moderate depression, 15-19 = Moderately severe depression, 20-27 = Severe depression   Psychosocial Evaluation and Intervention:  Psychosocial Evaluation - 01/15/20 1415      Psychosocial Evaluation & Interventions   Interventions Encouraged to exercise with the program and follow exercise prescription    Comments Natally has a long history of health issues. She is curious to see what she can do during cardiac rehab given her history of mobility issues. She wants to improve her stamina and balance. She reports a good support system of her husband and her daughter.    Expected Outcomes Short: attend cardiac rehab for exercise and education. Long: develop positive self care habits.    Continue Psychosocial Services  Follow up required by staff           Psychosocial Re-Evaluation:   Psychosocial Discharge (Final Psychosocial Re-Evaluation):   Vocational Rehabilitation: Provide vocational rehab assistance to qualifying candidates.   Vocational Rehab  Evaluation & Intervention:  Vocational Rehab - 01/15/20 1414      Initial Vocational Rehab Evaluation & Intervention   Assessment shows need for Vocational Rehabilitation No           Education: Education Goals: Education classes will be provided on a variety of topics geared toward better understanding of heart health and risk factor modification. Participant will state understanding/return demonstration of topics presented as noted by education test scores.  Learning Barriers/Preferences:  Learning Barriers/Preferences - 01/15/20 1414      Learning Barriers/Preferences   Learning Barriers None    Learning Preferences None           General Cardiac Education Topics:  AED/CPR: - Group verbal and written instruction with the use of models to demonstrate the basic use of the AED with the basic ABC's of resuscitation.   Anatomy & Physiology of the Heart: -  Group verbal and written instruction and models provide basic cardiac anatomy and physiology, with the coronary electrical and arterial systems. Review of Valvular disease and Heart Failure   Cardiac Procedures: - Group verbal and written instruction to review commonly prescribed medications for heart disease. Reviews the medication, class of the drug, and side effects. Includes the steps to properly store meds and maintain the prescription regimen. (beta blockers and nitrates)   Cardiac Medications I: - Group verbal and written instruction to review commonly prescribed medications for heart disease. Reviews the medication, class of the drug, and side effects. Includes the steps to properly store meds and maintain the prescription regimen.   Cardiac Medications II: -Group verbal and written instruction to review commonly prescribed medications for heart disease. Reviews the medication, class of the drug, and side effects. (all other drug classes)    Go Sex-Intimacy & Heart Disease, Get SMART - Goal Setting: - Group  verbal and written instruction through game format to discuss heart disease and the return to sexual intimacy. Provides group verbal and written material to discuss and apply goal setting through the application of the S.M.A.R.T. Method.   Other Matters of the Heart: - Provides group verbal, written materials and models to describe Stable Angina and Peripheral Artery. Includes description of the disease process and treatment options available to the cardiac patient.   Infection Prevention: - Provides verbal and written material to individual with discussion of infection control including proper hand washing and proper equipment cleaning during exercise session.   Cardiac Rehab from 01/25/2020 in [facility] Cardiac and Pulmonary Rehab  Date 01/25/20  Educator AS  Instruction Review Code 1- Verbalizes Understanding      Falls Prevention: - Provides verbal and written material to individual with discussion of falls prevention and safety.   Cardiac Rehab from 01/25/2020 in [facility] Cardiac and Pulmonary Rehab  Date 01/25/20  Educator AS  Instruction Review Code 1- Verbalizes Understanding      Other: -Provides group and verbal instruction on various topics (see comments)   Knowledge Questionnaire Score:  Knowledge Questionnaire Score - 01/25/20 1500      Knowledge Questionnaire Score   Pre Score 21/26           Core Components/Risk Factors/Patient Goals at Admission:  Personal Goals and Risk Factors at Admission - 01/25/20 1504      Core Components/Risk Factors/Patient Goals on Admission    Weight Management Yes    Intervention Weight Management: Develop a combined nutrition and exercise program designed to reach desired caloric intake, while maintaining appropriate intake of nutrient and fiber, sodium and fats, and appropriate energy expenditure required for the weight goal.;Weight Management: Provide education and appropriate resources to help participant work on and attain dietary  goals.;Weight Management/Obesity: Establish reasonable short term and long term weight goals.    Admit Weight 241 lb 3.2 oz (109.4 kg)    Goal Weight: Short Term 235 lb (106.6 kg)    Goal Weight: Long Term 220 lb (99.8 kg)    Expected Outcomes Short Term: Continue to assess and modify interventions until short term weight is achieved;Long Term: Adherence to nutrition and physical activity/exercise program aimed toward attainment of established weight goal;Weight Loss: Understanding of general recommendations for a balanced deficit meal plan, which promotes 1-2 lb weight loss per week and includes a negative energy balance of [phone] kcal/d;Understanding recommendations for meals to include 15-35% energy as protein, 25-35% energy from fat, 35-60% energy from carbohydrates, less than 266m of dietary cholesterol,  20-35 gm of total fiber daily;Understanding of distribution of calorie intake throughout the day with the consumption of 4-5 meals/snacks    Intervention Provide education about signs/symptoms and action to take for hypo/hyperglycemia.;Provide education about proper nutrition, including hydration, and aerobic/resistive exercise prescription along with prescribed medications to achieve blood glucose in normal ranges: Fasting glucose 65-99 mg/dL    Expected Outcomes Short Term: Participant verbalizes understanding of the signs/symptoms and immediate care of hyper/hypoglycemia, proper foot care and importance of medication, aerobic/resistive exercise and nutrition plan for blood glucose control.;Long Term: Attainment of HbA1C < 7%.    Intervention Provide education on lifestyle modifcations including regular physical activity/exercise, weight management, moderate sodium restriction and increased consumption of fresh fruit, vegetables, and low fat dairy, alcohol moderation, and smoking cessation.;Monitor prescription use compliance.    Expected Outcomes Short Term: Continued assessment and intervention  until BP is < 140/60m HG in hypertensive participants. < 130/81mHG in hypertensive participants with diabetes, heart failure or chronic kidney disease.;Long Term: Maintenance of blood pressure at goal levels.           Education:Diabetes - Individual verbal and written instruction to review signs/symptoms of diabetes, desired ranges of glucose level fasting, after meals and with exercise. Acknowledge that pre and post exercise glucose checks will be done for 3 sessions at entry of program.   [facility]  Date 01/15/20  [facility] Code 1- VeUnited States Steel Corporationnderstanding      Education: Know Your Numbers and Risk Factors: -Group verbal and written instruction about important numbers in your health.  Discussion of what are risk factors and how they play a role in the disease process.  Review of Cholesterol, Blood Pressure, Diabetes, and BMI and the role they play in your overall health.   Core Components/Risk Factors/Patient Goals Review:    Core Components/Risk Factors/Patient Goals at Discharge (Final Review):    ITP Comments:  ITP Comments    Row Name 01/15/20 1430 01/25/20 1507 01/27/20 1054       ITP Comments Initial telephone orientation completed. [facility] EP orientation scheduled for 7/12 at 1pm Completed 6MWT and gym orientation. Initial ITP created and sent for review to Dr. MaEmily FilbertMedical Director. 30 Day review completed. Medical Director ITP review done, changes made as directed, and signed approval by Medical Director.            Comments:

## 2020-01-28 ENCOUNTER — Encounter: Payer: Medicare Other | Admitting: *Deleted

## 2020-01-28 ENCOUNTER — Ambulatory Visit: Payer: Medicare Other | Admitting: Internal Medicine

## 2020-01-28 ENCOUNTER — Other Ambulatory Visit: Payer: Self-pay

## 2020-01-28 DIAGNOSIS — F329 Major depressive disorder, single episode, unspecified: Secondary | ICD-10-CM | POA: Diagnosis not present

## 2020-01-28 DIAGNOSIS — Z955 Presence of coronary angioplasty implant and graft: Secondary | ICD-10-CM

## 2020-01-28 DIAGNOSIS — I10 Essential (primary) hypertension: Secondary | ICD-10-CM | POA: Diagnosis not present

## 2020-01-28 DIAGNOSIS — G4733 Obstructive sleep apnea (adult) (pediatric): Secondary | ICD-10-CM | POA: Diagnosis not present

## 2020-01-28 DIAGNOSIS — G2581 Restless legs syndrome: Secondary | ICD-10-CM | POA: Diagnosis not present

## 2020-01-28 DIAGNOSIS — I447 Left bundle-branch block, unspecified: Secondary | ICD-10-CM | POA: Diagnosis not present

## 2020-01-28 LAB — GLUCOSE, CAPILLARY
Glucose-Capillary: 125 mg/dL — ABNORMAL HIGH (ref 70–99)
Glucose-Capillary: 99 mg/dL (ref 70–99)

## 2020-01-28 NOTE — Progress Notes (Signed)
Daily Session Note  Patient Details  Name: Denise Sharp MRN: [id number] Date of Birth: 1946/05/31 Referring Provider:     Cardiac Rehab from 01/25/2020 in [facility] Cardiac and Pulmonary Rehab  Referring Provider Arida (End)      Encounter Date: 01/28/2020  Check In:  Session Check In - 01/28/20 1404      Check-In   Supervising physician immediately available to respond to emergencies See telemetry face sheet for immediately available ER MD    Location [facility]-Cardiac & Pulmonary Rehab    Staff Present Renita Papa, RN BSN;Joseph Hood RCP,RRT,BSRT;Laureen [place], [place], RRT, CPFT    Virtual Visit No    Medication changes reported     No    Fall or balance concerns reported    No    Warm-up and Cool-down Performed on first and last piece of equipment    Resistance Training Performed Yes    VAD Patient? No    PAD/SET Patient? No      Pain Assessment   Currently in Pain? No/denies              Social History   Tobacco Use  Smoking Status Never Smoker  Smokeless Tobacco Never Used    Goals Met:  Independence with exercise equipment Exercise tolerated well No report of cardiac concerns or symptoms Strength training completed today  Goals Unmet:  Not Applicable  Comments: Pt able to follow exercise prescription today without complaint.  Will continue to monitor for progression.    Dr. Emily Filbert is Medical Director for [place] [facility].

## 2020-01-28 NOTE — Telephone Encounter (Signed)
Spoke with pt and she stated that she has already seen her lab results and message and appt is already scheduled.

## 2020-02-01 ENCOUNTER — Encounter: Payer: Medicare Other | Admitting: *Deleted

## 2020-02-01 ENCOUNTER — Other Ambulatory Visit: Payer: Self-pay

## 2020-02-01 DIAGNOSIS — I447 Left bundle-branch block, unspecified: Secondary | ICD-10-CM | POA: Diagnosis not present

## 2020-02-01 DIAGNOSIS — Z955 Presence of coronary angioplasty implant and graft: Secondary | ICD-10-CM

## 2020-02-01 DIAGNOSIS — F329 Major depressive disorder, single episode, unspecified: Secondary | ICD-10-CM | POA: Diagnosis not present

## 2020-02-01 DIAGNOSIS — G2581 Restless legs syndrome: Secondary | ICD-10-CM | POA: Diagnosis not present

## 2020-02-01 DIAGNOSIS — I10 Essential (primary) hypertension: Secondary | ICD-10-CM | POA: Diagnosis not present

## 2020-02-01 DIAGNOSIS — G4733 Obstructive sleep apnea (adult) (pediatric): Secondary | ICD-10-CM | POA: Diagnosis not present

## 2020-02-01 LAB — GLUCOSE, CAPILLARY
Glucose-Capillary: 134 mg/dL — ABNORMAL HIGH (ref 70–99)
Glucose-Capillary: 103 mg/dL — ABNORMAL HIGH (ref 70–99)

## 2020-02-01 NOTE — Progress Notes (Signed)
Daily Session Note  Patient Details  Name: Denise Sharp MRN: [id number] Date of Birth: 06/17/1946 Referring Provider:     Cardiac Rehab from 01/25/2020 in [facility] Cardiac and Pulmonary Rehab  Referring Provider Arida (End)      Encounter Date: 02/01/2020  Check In:  Session Check In - 02/01/20 1408      Check-In   Supervising physician immediately available to respond to emergencies See telemetry face sheet for immediately available ER MD    Location [facility]-Cardiac & Pulmonary Rehab    Staff Present Renita Papa, RN Margurite Auerbach, MS Exercise Physiologist;Kelly Amedeo Plenty, BS, ACSM CEP, Exercise Physiologist    Virtual Visit No    Medication changes reported     No    Fall or balance concerns reported    No    Warm-up and Cool-down Performed on first and last piece of equipment    Resistance Training Performed Yes    VAD Patient? No    PAD/SET Patient? No      Pain Assessment   Currently in Pain? No/denies              Social History   Tobacco Use  Smoking Status Never Smoker  Smokeless Tobacco Never Used    Goals Met:  Independence with exercise equipment Exercise tolerated well No report of cardiac concerns or symptoms Strength training completed today  Goals Unmet:  Not Applicable  Comments: Pt able to follow exercise prescription today without complaint.  Will continue to monitor for progression.    Dr. Emily Filbert is Medical Director for [place] and [facility].

## 2020-02-02 DIAGNOSIS — Z23 Encounter for immunization: Secondary | ICD-10-CM | POA: Diagnosis not present

## 2020-02-03 ENCOUNTER — Telehealth: Payer: Self-pay

## 2020-02-03 ENCOUNTER — Other Ambulatory Visit: Payer: Self-pay

## 2020-02-03 ENCOUNTER — Telehealth: Payer: Self-pay | Admitting: Internal Medicine

## 2020-02-03 MED ORDER — ATORVASTATIN CALCIUM 40 MG PO TABS: 40.0000 mg | ORAL_TABLET | Freq: Every day | ORAL | 0 refills | Status: DC

## 2020-02-03 NOTE — Telephone Encounter (Signed)
*  STAT* If patient is at the pharmacy, call can be transferred to refill team.   1. Which medications need to be refilled? (please list name of each medication and dose if known) atorvastatin (LIPITOR) 40 MG 1 tablet by mouth daily   2. Which pharmacy/location (including street and city if local pharmacy) is medication to [facility]? [facility] [address]  3. Do they need a 30 day or 90 day supply? [facility] [address] - would prefer the rest of the refills to go through [facility]

## 2020-02-03 NOTE — Telephone Encounter (Signed)
Requested Prescriptions   Signed Prescriptions Disp Refills  . atorvastatin (LIPITOR) 40 MG tablet 30 tablet 0    Sig: Take 1 tablet (40 mg total) by mouth daily.    Authorizing Provider: END, CHRISTOPHER    Ordering User: Janan Ridge

## 2020-02-03 NOTE — Telephone Encounter (Signed)
Patient is calling to see if she could be seen before 02/23/2020. Informed patient that we have 3 provider on vacation starting next week and will only have 1 provider in the office through the end of August. She states that every time she eats or drinks she chocks and has to throw up to get it up. Informed patient I would ask Dr. Allen Norris nurse to see if there is anything sooner. Asked Ginger and she states Dr. Allen Norris is on vacation till the Monday before her appointment on 02/23/2020 and that was his first appointment that he would have. Informed patient if her symptoms got worse before her appointment on 02/23/2020 she needed to go to the ER. She states she would chock to death as long as you have to wait in the ER. She states last time she went for her heart she waited 8 hours before they took her back. I said she could call [facility] that she saw 1 year ago to see if  They could see her sooner. She states she wants to see Dr. Allen Norris

## 2020-02-03 NOTE — Telephone Encounter (Signed)
*  STAT* If patient is at the pharmacy, call can be transferred to refill team.   1. Which medications need to be refilled? (please list name of each medication and dose if known) Plavix  2. Which pharmacy/location (including street and city if local pharmacy) is medication to be sent to? [facility] [address]  3. Do they need a 30 day or 90 day supply? 30  Patient would lalso ike a 90 day supply sent to [facility] by Mail.

## 2020-02-03 NOTE — Telephone Encounter (Signed)
[facility] and 90 day sent to [facility] By- Mail.

## 2020-02-04 ENCOUNTER — Other Ambulatory Visit: Payer: Self-pay

## 2020-02-04 ENCOUNTER — Other Ambulatory Visit: Payer: Self-pay | Admitting: Internal Medicine

## 2020-02-04 MED ORDER — CLOPIDOGREL BISULFATE 75 MG PO TABS: 75.0000 mg | ORAL_TABLET | Freq: Every day | ORAL | 10 refills | Status: DC

## 2020-02-04 NOTE — Progress Notes (Signed)
Cardiology Office Note    Date:  02/12/2020   ID:  Denise Sharp, DOB Oct 21, 1945, MRN [id number]  PCP:  [name], MD  Cardiologist:  [name], MD  Electrophysiologist:  None   Chief Complaint: Follow-up  History of Present Illness:   Denise Sharp is a 73 y.o. female with history of CAD s/p PCI to the ostial and mid LAD in the setting of unstable angina, HFpEF, DM2, HTN, iron deficiency anemia s/p gastric bypass x 2, IBS, esophageal stenosis, chronic dizziness with gait instability, cervicogenic headache with cervical spine stenosis, OSA on CPAP, RLS, and adrenal mass who presents for follow up of CAD.  Prior ischemic evaluations in 1987 and 2014 via cardiac cath revealed minor luminal irregularities.  Nuclear stress testing in 05/2019 was low risk.  More recently, she was seen in the office in 11/2019 with worsening exertional dyspnea.  Echo in 12/2019 showed preserved LVEF 65 to 70%, no regional wall motion abnormalities, grade 2 diastolic dysfunction, normal RV systolic function and ventricular cavity size, and no significant valvular abnormalities.  During the procedure, she complained of significant chest pain over the preceding 2 weeks and was sent to the ED and ultimately admitted.  Subsequent diagnostic cardiac cath showed significant disease involving the ostial and mid LAD which was treated with PCI/stenting in a staged fashion.  A few days after hospital discharge, she noted mild epistaxis and was advised to continue DAPT.  She was most recently seen in the office on 01/13/2020 and reported she was feeling much better when compared to prior to her recent PCI.  She still noted some fatigue but had not had any significant dyspnea.  She was working with PT and planned to to participate in cardiac rehab as well.  She did have one brief episode of chest pain along the center and right side of her chest.  Otherwise, she denied any exertional symptoms.  She attributed improved  sleep to her recent coronary intervention as well.  She had started a new diet and has lost over 14 pounds.  She comes in today continuing to do well from a cardiac perspective.  She denies any chest pain, dyspnea, palpitations, dizziness, presyncope, syncope.  She is participating in cardiac rehab and tolerating this well.  She does continue to note some generalized fatigue though reports over the past year to year and a half she has had multiple medical ailments for which she has previously had to be bedbound and in this setting notes significant physical deconditioning.  She is planning to discuss dietary changes with dietitian at cardiac rehab.  She continues to note mild epistaxis most mornings when she blows her nose.  In this setting, she continues to note a metallic taste in her mouth.  No frank epistaxis.  She denies any falls, hematochezia, or melena.  She is dealing with some dysphagia and has previously required esophageal dilatation.  She is planning to follow-up with GI for this next month.   Labs independently reviewed: 01/2020 - potassium 3.9, BUN 17, serum creatinine 1.11, A1c 7.6, Hgb 10.5, PLT 325 12/2019 - TC 183, TG 140, HDL 42, LDL 113 10/2019 - albumin 3.6, AST/ALT normal  Past Medical History:  Diagnosis Date  . Abnormality of gait 03/25/2013  . Adrenal mass, left ([place])   . Anemia    iron deficient post  2 unit txfsn 2009, normal endo/colonoscopy by [facility]  . Arthritis   . Atypical chest pain    a.  04/1986 Cath ([facility]): nl cors, EF 65%; b. 07/2012 [facility] LM nl, LAD/LCX/RCA w/ min irregs; c. 07/2016 [facility]): "Equivocal" w/ small inf wall rev defect and hyperdynamic LV contraction, EF 87%; d. 08/2016 Cardiac CT Ca2+ score [facility]): Ca2+ score 1548; e. 05/2019 MV: No ischemia. Diaph atten. EF 75%.  . Cervical spinal stenosis 1994   due to trauma to back ([employer] accident), has intermittent paralysis and parasthesias  . Cervicogenic headache 03/23/2014  . Depression   . Diastolic  dysfunction    a. 12/2019 Echo: EF 65-70%, Gr2 DD. No significant valvular dzs.  . Dizziness    chronic dizziness  . DJD (degenerative joint disease)    a. Chronic R shoulder pain pending R shoulder replacement 07/2017.  [name] Esophageal stenosis March 2011   with transietn outlet obstruction by food, cleared by EGD   . Family history of adverse reaction to anesthesia    daughter PONV  . Gastric bypass status for obesity   . Headache(784.0)   . Hypertension   . IBS (irritable bowel syndrome)   . Left bundle branch block (LBBB)    a. Intermittently present - likely rate related. - patient denies  . Obesity   . Obstructive sleep apnea    using CPAP  . 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 Restless leg syndrome   . Rotator cuff arthropathy, right 08/13/2017  . Syncope and collapse 03/12/2014  . Type II diabetes mellitus ([place])     Past Surgical History:  Procedure Laterality Date  . ABDOMINAL HYSTERECTOMY    . APPENDECTOMY    . COLONOSCOPY    . CORONARY STENT INTERVENTION N/A 01/04/2020   Procedure: CORONARY STENT INTERVENTION;  Surgeon: [name], MD;  Location: [place] [facility];  Service: Cardiovascular;  Laterality: N/A;  LAD   . DIAPHRAGMATIC HERNIA REPAIR  2015  . ESOPHAGEAL DILATION     multiple  . ESOPHAGOGASTRODUODENOSCOPY    . [place]  . GALLBLADDER SURGERY  resection  . GASTRIC BYPASS    . GASTRIC BYPASS  2000, 2005   Dr. Debroah Loop  . [facility] IMPLANT PLACEMENT  April 2013   [place]  . JOINT REPLACEMENT  2007   bilateral knee. [name],  [name]  . LEFT HEART CATH AND CORS/GRAFTS ANGIOGRAPHY N/A 12/31/2019   Procedure: LEFT HEART CATH AND CORS/GRAFTS ANGIOGRAPHY;  Surgeon: [name], MD;  Location: [place] [facility];  Service: Cardiovascular;  Laterality: N/A;  . PANNICULECTOMY  06/16/2019  . PANNICULECTOMY N/A 06/16/2019   Procedure: PANNICULECTOMY;  Surgeon: [name], MD;  Location: [place];  Service: Plastics;   Laterality: N/A;  3 hours, please  . REVERSE SHOULDER ARTHROPLASTY Right 08/13/2017   Procedure: REVERSE RIGHT SHOULDER ARTHROPLASTY;  Surgeon: [name], MD;  Location: [place];  Service: Orthopedics;  Laterality: Right;  . ROTATOR CUFF REPAIR     right  . [place]  . TOTAL ABDOMINAL HYSTERECTOMY W/ BILATERAL SALPINGOOPHORECTOMY  1974  . UMBILICAL HERNIA REPAIR  Aug 11, 2015    Current Medications: Current Meds  Medication Sig  . albuterol (VENTOLIN HFA) 108 (90 Base) MCG/ACT inhaler Inhale 2 puffs into the lungs every 8 (eight) hours as needed for wheezing or shortness of breath.  [name] aspirin EC 81 MG tablet Take 1 tablet (81 mg total) by mouth daily.  [name] atorvastatin (LIPITOR) 40 MG tablet Take 1 tablet (40 mg total) by mouth daily.  . benzonatate (TESSALON) 200 MG capsule  Take 1 capsule (200 mg total) by mouth 2 (two) times daily as needed for cough.  . blood glucose meter kit and supplies Use to check sugars up to three times daily. Dx code: E11.9  . Blood Glucose Monitoring Suppl (ONETOUCH VERIO) w/Device KIT 1 Device by Does not apply route daily. Use to test blood glucose once daily. Dx code = E11.9  . buPROPion (WELLBUTRIN) 75 MG tablet TAKE 1-2 TABLETS (75-150 MG TOTAL) BY MOUTH SEE ADMIN INSTRUCTIONS. TAKE 2 TABLETS IN THE MORNING AND 1 TABLET IN THE AFTERNOON (Patient taking differently: 75 mg at bedtime. )  . butalbital-acetaminophen-caffeine (FIORICET) 50-325-40 MG tablet Take 1 tablet by mouth every 6 (six) hours as needed for headache.  . citalopram (CELEXA) 10 MG tablet Take 1 tablet (10 mg total) by mouth daily.  . clopidogrel (PLAVIX) 75 MG tablet Take 1 tablet (75 mg total) by mouth daily.  . clotrimazole-betamethasone (LOTRISONE) cream Apply 1 application topically 2 (two) times daily. (Patient taking differently: Apply 1 application topically 2 (two) times daily as needed (rash). )  . Continuous Blood Gluc Receiver (FREESTYLE LIBRE 14 DAY READER) DEVI  1 applicator by Does not apply route 4 (four) times daily.  . Continuous Blood Gluc Sensor (FREESTYLE LIBRE 14 DAY SENSOR) MISC Use to test blood sugar 4 times daily  . cyanocobalamin (,VITAMIN B-12,) 1000 MCG/ML injection Inject 1 ml (1000 mcg ) IM weekly x 4,  Then monthly thereafter (Patient taking differently: Inject 1,000 mcg into the muscle every 30 (thirty) days. On the 12th day)  . diazepam (VALIUM) 5 MG tablet Take 1 tablet (5 mg total) by mouth at bedtime as needed for anxiety.  . dicyclomine (BENTYL) 10 MG capsule Take 1 capsule (10 mg total) by mouth 4 (four) times daily -  before meals and at bedtime.  . diphenhydrAMINE (BENADRYL) 25 MG tablet Take 50 mg by mouth daily as needed for allergies.  . diphenoxylate-atropine (LOMOTIL) 2.5-0.025 MG tablet Take 1 tablet by mouth 4 (four) times daily as needed for diarrhea or loose stools.  . DROPLET PEN NEEDLES 31G X 5 MM MISC USE ONE NEEDLE SUBCUTANEOUSLY AS DIRECTED (REMOVE AND DISCARD NEEDLE IN SHARPS CONTAINER IMMEDIATELY AFTER USE)  . Eszopiclone 3 MG TABS Take 1 tablet (3 mg total) by mouth at bedtime.  . Insulin Glargine (LANTUS SOLOSTAR) 100 UNIT/ML Solostar Pen Inject 11 Units into the skin daily. (Patient taking differently: Inject 10-15 Units into the skin daily. )  . ipratropium (ATROVENT) 0.06 % nasal spray Place 2 sprays into both nostrils 4 (four) times daily.  Elmore Guise Devices (ONE TOUCH DELICA LANCING DEV) MISC Use to test blood glucose once daily. Dx code = E11.9  . levETIRAcetam (KEPPRA) 750 MG tablet Take 1 tablet (750 mg total) by mouth at bedtime. (Patient taking differently: Take 750 mg by mouth at bedtime as needed (restless legs). )  . nitroGLYCERIN (NITROSTAT) 0.4 MG SL tablet Place 1 tablet (0.4 mg total) under the tongue every 5 (five) minutes as needed for chest pain.  . NONFORMULARY OR COMPOUNDED [place]  Combination Pain Cream -  Baclofen 2%, Doxepin 5%, Gabapentin 6%, Topiramate 2%, Pentoxifylline  3% Apply 1-2 grams to affected area 3-4 times daily Qty. 120 gm 3 refills  . nystatin [facility]) powder Apply 1 g topically 4 (four) times daily as needed (rash).   . ondansetron (ZOFRAN ODT) 4 MG disintegrating tablet Take 1 tablet (4 mg total) by mouth every 8 (eight) hours as needed for  nausea or vomiting.  Glory Rosebush VERIO test strip daily.  [name] OVER THE COUNTER MEDICATION Apply 1 application topically daily as needed (pain). [place] Gell  . pregabalin (LYRICA) 100 MG capsule Take 100 mg by mouth at bedtime as needed (leg cramps).   . Syringe/Needle, Disp, (SYRINGE 3CC/25GX1") 25G X 1" 3 ML MISC Use for b12 injections  . traMADol (ULTRAM) 50 MG tablet Take 0.5 tablets (25 mg total) by mouth every 12 (twelve) hours as needed for severe pain.  [name] umeclidinium-vilanterol (ANORO ELLIPTA) 62.5-25 MCG/INH AEPB Inhale 1 puff into the lungs daily.    Allergies:   Demeclocycline, Erythromycin, Flagyl [metronidazole], Glucophage [metformin hcl], Tetracyclines & related, Diovan [valsartan], Sulfa antibiotics, and Xanax [alprazolam]   Social History   Socioeconomic History  . Marital status: Married    Spouse name: [name]   . Number of children: 2  . Years of education: College   . Highest education level: Not on file  Occupational History    Employer: retired  Tobacco Use  . Smoking status: Never Smoker  . Smokeless tobacco: Never Used  Vaping Use  . Vaping Use: Never used  Substance and Sexual Activity  . Alcohol use: No  . Drug use: No  . Sexual activity: Not Currently    Birth control/protection: Post-menopausal, Surgical  Other Topics Concern  . Not on file  Social History Narrative   Patient lives at home with husband [name].    Patient has 2 children.    Patient has [employer].    Patient is on disability.    Patient is right handed.    Social Determinants of Health   Financial Resource Strain:   . Difficulty of Paying Living Expenses:   Food Insecurity:   . Worried About  Charity fundraiser in the Last Year:   . Arboriculturist in the Last Year:   Transportation Needs:   . Film/video editor (Medical):   [name] Lack of Transportation (Non-Medical):   Physical Activity:   . Days of Exercise per Week:   . Minutes of Exercise per Session:   Stress:   . Feeling of Stress :   Social Connections:   . Frequency of Communication with Friends and Family:   . Frequency of Social Gatherings with Friends and Family:   . Attends Religious Services:   . Active Member of Clubs or Organizations:   . Attends Archivist Meetings:   [name] Marital Status:      Family History:  The patient's family history includes Depression in her mother; Diabetes in her daughter; Headache in her mother; Heart disease in her daughter, father, and mother; Hypertension in her daughter, father, mother, and son; Osteoporosis in her father; Prostate cancer in her father; Stroke in her father and mother; Thyroid disease in her mother.  ROS:   Review of Systems  Constitutional: Positive for malaise/fatigue. Negative for chills, diaphoresis, fever and weight loss.  HENT: Positive for nosebleeds. Negative for congestion.        Metallic taste in mouth  Eyes: Negative for discharge and redness.  Respiratory: Negative for cough, sputum production, shortness of breath and wheezing.   Cardiovascular: Negative for chest pain, palpitations, orthopnea, claudication, leg swelling and PND.  Gastrointestinal: Negative for abdominal pain, blood in stool, heartburn, melena, nausea and vomiting.  Musculoskeletal: Negative for falls and myalgias.  Skin: Negative for rash.  Neurological: Positive for weakness. Negative for dizziness, tingling, tremors, sensory change, speech change, focal weakness and  loss of consciousness.  Endo/Heme/Allergies: Does not bruise/bleed easily.  Psychiatric/Behavioral: Negative for substance abuse. The patient is not nervous/anxious.   All other systems reviewed and are  negative.    EKGs/Labs/Other Studies Reviewed:    Studies reviewed were summarized above. The additional studies were reviewed today:  2D echo 12/2019: 1. Left ventricular ejection fraction, by estimation, is 65 to 70%. The  left ventricle has normal function. The left ventricle has no regional  wall motion abnormalities. Left ventricular diastolic parameters are  consistent with Grade II diastolic  dysfunction (pseudonormalization).  2. Right ventricular systolic function is normal. The right ventricular  size is normal.  3. The mitral valve is normal in structure. No evidence of mitral valve  regurgitation. No evidence of mitral stenosis.  4. The aortic valve is normal in structure. Aortic valve regurgitation is  not visualized. No aortic stenosis is present. __________  [facility] 12/2019: Coronary angiography:  Coronary dominance: Right  Left mainstem:   Large vessel that bifurcates into the LAD and left circumflex, no significant disease noted  Left anterior descending (LAD):   Large vessel that extends to the apical region, diagonal branch 2 of moderate size, 65% ostial/proximal LAD disease/regions of haziness, 80% mid LAD disease  Left circumflex (LCx):  Large vessel with OM branch 2, no significant disease noted  Right coronary artery (RCA):  Right dominant vessel with PL and PDA, no significant disease noted  Left ventriculography: Left ventricular systolic function is normal, LVEF is estimated at 55-65%, there is no significant mitral regurgitation , no significant aortic valve stenosis  Final Conclusions:   Moderate ostial/proximal LAD disease Severe mid LAD disease Normal ejection fraction  Recommendations:  Difficult decision whether to proceed with aggressive intervention Recent negative stress test, symptoms with some typical and atypical features She is relatively immobile, she does report some dysphagia symptoms, unclear if she is appreciating more GI  symptoms Recommend starting isosorbide 30 mg daily Images discussed with Dr. Saunders Revel -We will need to consider PCI of the mid LAD, consider FFR of ostial LAD if she continues to have anginal symptoms.  Would recommend this be [facility] at [facility], consider radial access given right groin access very difficult __________  PCI 12/2019:  Colon Flattery LAD to Prox LAD lesion is 80% stenosed.  Post intervention, there is a 0% residual stenosis.  Post intervention, there is a 0% residual stenosis.  A drug-eluting stent was successfully placed using a STENT RESOLUTE ONYX 3.5X12.  Mid LAD lesion is 80% stenosed.  A drug-eluting stent was successfully placed using a [place] [id number].   Successful PCI and drug-eluting stent placement to mid as well as ostial LAD.  Recommendations: Dual antiplatelet therapy for at least 6 months but should consider long-term dual antiplatelet therapy given that the ostial LAD stent extends a millimeter into the left main coronary artery Aggressive treatment of risk factors. Likely discharge home tomorrow if no issues.   EKG:  EKG is ordered today.  The EKG ordered today demonstrates NSR, 74 bpm, baseline wandering, no acute ST-T changes  Recent Labs: 11/03/2019: ALT 10 01/25/2020: BUN 17; Creatinine, Ser 1.11; Hemoglobin 10.5; Platelets 325; Potassium 3.9; Sodium 139  Recent Lipid Panel    Component Value Date/Time   CHOL 183 12/31/2019 0115   CHOL 155 02/27/2014 0420   TRIG 140 12/31/2019 0115   TRIG 121 02/27/2014 0420   HDL 42 12/31/2019 0115   HDL 43 02/27/2014 0420   CHOLHDL 4.4 12/31/2019 0115   VLDL  28 12/31/2019 0115   VLDL 24 02/27/2014 0420   LDLCALC 113 (H) 12/31/2019 0115   LDLCALC 88 02/27/2014 0420   LDLDIRECT 111.0 06/16/2015 0936    PHYSICAL EXAM:    VS:  BP (!) 148/80 (BP Location: Right Arm, Patient Position: Sitting, Cuff Size: Normal)   Pulse 74   Ht 5' 4.5" (1.638 m)   Wt (!) 249 lb 8 oz (113.2 kg)   SpO2 98%   BMI 42.17  kg/m   BMI: Body mass index is 42.17 kg/m.  Physical Exam Constitutional:      Appearance: She is well-developed.  HENT:     Head: Normocephalic and atraumatic.  Eyes:     General:        Right eye: No discharge.        Left eye: No discharge.  Neck:     Vascular: No JVD.  Cardiovascular:     Rate and Rhythm: Normal rate and regular rhythm.     Pulses: No midsystolic click and no opening snap.          Posterior tibial pulses are 2+ on the right side and 2+ on the left side.     Heart sounds: Normal heart sounds, S1 normal and S2 normal. Heart sounds not distant. No murmur heard.  No friction rub.  Pulmonary:     Effort: Pulmonary effort is normal. No respiratory distress.     Breath sounds: Normal breath sounds. No decreased breath sounds, wheezing or rales.  Chest:     Chest wall: No tenderness.  Abdominal:     General: There is no distension.     Palpations: Abdomen is soft.     Tenderness: There is no abdominal tenderness.  Musculoskeletal:     Cervical back: Normal range of motion.  Skin:    General: Skin is warm and dry.     Nails: There is no clubbing.  Neurological:     Mental Status: She is alert and oriented to person, place, and time.  Psychiatric:        Speech: Speech normal.        Behavior: Behavior normal.        Thought Content: Thought content normal.        Judgment: Judgment normal.     Wt Readings from Last 3 Encounters:  02/12/20 (!) 249 lb 8 oz (113.2 kg)  02/10/20 (!) 249 lb (112.9 kg)  01/25/20 241 lb 3.2 oz (109.4 kg)     ASSESSMENT & PLAN:   1. CAD involving the native coronary arteries without angina: She continues to do well without symptoms concerning for angina.  EKG remains nonischemic.  Continue dual antiplatelet therapy with aspirin and clopidogrel without interruption for at least the next 12 months.  I do wonder if the metallic taste in her mouth is related to her ongoing epistaxis with blood draining down the back of her  throat.  She will otherwise continue atorvastatin and as needed sublingual nitro.  Continue with cardiac rehab.  She will discuss dietary changes with dietitian there.  No plans for further ischemic evaluation at this time.  2. HFpEF: She appears euvolemic and well compensated, though volume assessment is challenging given morbid obesity.  CBP recommendations as outlined below.  Low-sodium diet recommended.  Not requiring standing diuretic.  3. HTN: Blood pressure is mildly elevated in the office today though she reports well-controlled blood pressure at home.  Not currently requiring antihypertensive therapy.  4. HLD: LDL of 113  from 12/2019.  Goal LDL less than 70.  Remains on atorvastatin 40 mg daily.  Recommend obtaining fasting lipid panel and LFTs next month with further titration of statin therapy as indicated.  5. Epistaxis/metalic taste: She continues to note mild blood mixed mucus in the mornings when she blows her nose.  I do wonder if this is contributing to her metallic taste.  I advised her to follow-up with PCP and ENT if indicated.  Disposition: F/u with Dr. Saunders Revel or an APP in 3 months.   Medication Adjustments/Labs and Tests Ordered: Current medicines are reviewed at length with the patient today.  Concerns regarding medicines are outlined above. Medication changes, Labs and Tests ordered today are summarized above and listed in the Patient Instructions accessible in Encounters.   Signed, Christell Faith, PA-C 02/12/2020 11:21 AM     [place] [address] [place], [place] [address] [phone]

## 2020-02-08 ENCOUNTER — Ambulatory Visit: Payer: Medicare Other | Admitting: Podiatry

## 2020-02-08 ENCOUNTER — Other Ambulatory Visit: Payer: Self-pay

## 2020-02-08 ENCOUNTER — Encounter: Payer: Medicare Other | Admitting: *Deleted

## 2020-02-08 DIAGNOSIS — G4733 Obstructive sleep apnea (adult) (pediatric): Secondary | ICD-10-CM | POA: Diagnosis not present

## 2020-02-08 DIAGNOSIS — I447 Left bundle-branch block, unspecified: Secondary | ICD-10-CM | POA: Diagnosis not present

## 2020-02-08 DIAGNOSIS — Z955 Presence of coronary angioplasty implant and graft: Secondary | ICD-10-CM

## 2020-02-08 DIAGNOSIS — G2581 Restless legs syndrome: Secondary | ICD-10-CM | POA: Diagnosis not present

## 2020-02-08 DIAGNOSIS — I10 Essential (primary) hypertension: Secondary | ICD-10-CM | POA: Diagnosis not present

## 2020-02-08 DIAGNOSIS — F329 Major depressive disorder, single episode, unspecified: Secondary | ICD-10-CM | POA: Diagnosis not present

## 2020-02-08 NOTE — Progress Notes (Signed)
Daily Session Note  Patient Details  Name: Denise Sharp MRN: [id number] Date of Birth: 30-Mar-1946 Referring Provider:     Cardiac Rehab from 01/25/2020 in [facility]  Referring Provider Arida (End)      Encounter Date: 02/08/2020  Check In:  Session Check In - 02/08/20 1409      Check-In   Supervising physician immediately available to respond to emergencies See telemetry face sheet for immediately available ER MD    Location [facility]-Cardiac & Pulmonary Rehab    Staff Present Renita Papa, RN BSN;Joseph [address] [place], [place], ACSM CEP, Exercise Physiologist;Amanda Oletta Darter, [place], ACSM CEP, Exercise Physiologist    Virtual Visit No    Medication changes reported     No    Fall or balance concerns reported    No    Warm-up and Cool-down Performed on first and last piece of equipment    Resistance Training Performed Yes    VAD Patient? No    PAD/SET Patient? No      Pain Assessment   Currently in Pain? No/denies              Social History   Tobacco Use  Smoking Status Never Smoker  Smokeless Tobacco Never Used    Goals Met:  Independence with exercise equipment Exercise tolerated well No report of cardiac concerns or symptoms Strength training completed today  Goals Unmet:  Not Applicable  Comments: Pt able to follow exercise prescription today without complaint.  Will continue to monitor for progression.    Dr. Emily Filbert is Medical Director for [facility].

## 2020-02-10 ENCOUNTER — Other Ambulatory Visit: Payer: Self-pay

## 2020-02-10 ENCOUNTER — Ambulatory Visit: Payer: Medicare Other | Admitting: Orthotics

## 2020-02-10 ENCOUNTER — Encounter: Payer: Medicare Other | Admitting: *Deleted

## 2020-02-10 ENCOUNTER — Encounter: Payer: Self-pay | Admitting: Primary Care

## 2020-02-10 ENCOUNTER — Ambulatory Visit (INDEPENDENT_AMBULATORY_CARE_PROVIDER_SITE_OTHER): Payer: Medicare Other | Admitting: Primary Care

## 2020-02-10 DIAGNOSIS — Z955 Presence of coronary angioplasty implant and graft: Secondary | ICD-10-CM

## 2020-02-10 DIAGNOSIS — Z9889 Other specified postprocedural states: Secondary | ICD-10-CM

## 2020-02-10 DIAGNOSIS — I200 Unstable angina: Secondary | ICD-10-CM

## 2020-02-10 DIAGNOSIS — B351 Tinea unguium: Secondary | ICD-10-CM

## 2020-02-10 DIAGNOSIS — G4733 Obstructive sleep apnea (adult) (pediatric): Secondary | ICD-10-CM | POA: Diagnosis not present

## 2020-02-10 DIAGNOSIS — G2581 Restless legs syndrome: Secondary | ICD-10-CM | POA: Diagnosis not present

## 2020-02-10 DIAGNOSIS — F329 Major depressive disorder, single episode, unspecified: Secondary | ICD-10-CM | POA: Diagnosis not present

## 2020-02-10 DIAGNOSIS — M79676 Pain in unspecified toe(s): Secondary | ICD-10-CM

## 2020-02-10 DIAGNOSIS — E11610 Type 2 diabetes mellitus with diabetic neuropathic arthropathy: Secondary | ICD-10-CM

## 2020-02-10 DIAGNOSIS — Z9989 Dependence on other enabling machines and devices: Secondary | ICD-10-CM

## 2020-02-10 DIAGNOSIS — I447 Left bundle-branch block, unspecified: Secondary | ICD-10-CM | POA: Diagnosis not present

## 2020-02-10 DIAGNOSIS — E1149 Type 2 diabetes mellitus with other diabetic neurological complication: Secondary | ICD-10-CM

## 2020-02-10 DIAGNOSIS — I10 Essential (primary) hypertension: Secondary | ICD-10-CM | POA: Diagnosis not present

## 2020-02-10 NOTE — Progress Notes (Signed)
'@Patient'  ID: Denise Sharp, female    DOB: 01-20-1946, 74 y.o.   MRN: [id number]  Chief Complaint  Patient presents with  . Follow-up    osa on cpap    Referring provider: [name], MD  HPI: 74 year old female, never smoked.  Past medical history significant for obstructive sleep apnea, obesity hypoventilation syndrome, restless leg, hypersomnia, insomnia, IBS, type 2 diabetes, peripheral neuropathy Neri nodules, DVT in 2017.  Patient of Dr. Annamaria Boots seen in office 10/29/2019.  N PSG in 2014 at [facility] showed mild obstructive sleep apnea, AHI 13.2 an hour.  CPAP titration study in 2016 showed an optimal pressure of 7 cm H2O.  Pulmonary function testing in 2020 normal.  Maintained on auto CPAP 4 to 10 cm H2O.  DME company is [employer].  02/10/2020 Patient presents today for 61-monthOSA.  Accompanied by her husband. She had a horrible persistent cough and extreme shortness of breath prior to this visit. She met with cardiology, had heart cath in June and found to have 80% stenosis to ost LAD to Prox LAD and Mid LAD. She had two drug eluting stents placed. Started on dual antiplatelet therapy with asa/plavix. She is doing much better. Reports metallic taste in her mouth since starting Plavix, cardiology office notified and she needs to remain on medication for 6 months before changing. Plans on following keto diet to help with weight loss. She will be needing to meet with dietician. Most of the cough since has gone away since procedure. She is sleeping better as well.    Airview download: 30/30 days used; 100% >4 hours Average usage 8 hours 41 mins Pressure 4-12cm h20 (9.2 cm h20- 95%) Mild air leaks AHI 1.7  Imaging: CT chest wo contrast- No CT findings to explain dyspnea on exertion or shortness of breath. Adjecnt ground-glass opacities noted left upper lobe, stable in 2 year interval since February 2019. These are also stable since CT chest in September 2016. Considered benign etiology.  Aortic atherosclerosis.  Cardiac testing: 01/04/20 Cardiac catheterization- 2 drug eluting stents placed mid and ostial LAD  Allergies  Allergen Reactions  . Demeclocycline Hives  . Erythromycin Nausea And Vomiting and Other (See Comments)    Severe irritable bowel  . Flagyl [Metronidazole] Nausea And Vomiting and Other (See Comments)    Severe irritable bowel  . Glucophage [Metformin Hcl] Nausea And Vomiting and Other (See Comments)    "Sick" "I won't take anything that has metformin in it"  . Tetracyclines & Related Hives and Rash  . Diovan [Valsartan] Nausea Only       . Sulfa Antibiotics Rash and Other (See Comments)    As child  . Xanax [Alprazolam] Other (See Comments)    Unknown reaction    Immunization History  Administered Date(s) Administered  . Fluad Quad(high Dose 65+) 03/06/2019  . Influenza Split 06/13/2011  . Influenza, High Dose Seasonal PF 08/21/2018  . Influenza,inj,Quad PF,6+ Mos 05/04/2014, 05/12/2015  . Influenza-Unspecified 04/15/2012  . Moderna SARS-COVID-2 Vaccination 02/02/2020  . Pneumococcal Conjugate-13 08/11/2013, 01/06/2014  . Pneumococcal Polysaccharide-23 01/19/2010, 09/03/2018  . Tdap 04/18/2016    Past Medical History:  Diagnosis Date  . Abnormality of gait 03/25/2013  . [place]   . Anemia    iron deficient post  2 unit txfsn 2009, [facility] . Arthritis   . Atypical chest pain    a. 04/1986 Cath ([facility]): nl cors, EF 65%; b. 07/2012 Cath ([facility]: LM nl, LAD/LCX/RCA  w/ min irregs; c. 07/2016 [facility]): "Equivocal" w/ small inf wall rev defect and hyperdynamic LV contraction, EF 87%; d. 08/2016 Cardiac CT Ca2+ score [facility]): Ca2+ score 1548; e. 05/2019 MV: No ischemia. Diaph atten. EF 75%.  . Cervical spinal stenosis 1994   due to trauma to back ([employer] accident), has intermittent paralysis and parasthesias  . Cervicogenic headache 03/23/2014  . Depression   . Diastolic dysfunction    a. 12/2019 Echo: EF  65-70%, Gr2 DD. No significant valvular dzs.  . Dizziness    chronic dizziness  . DJD (degenerative joint disease)    a. Chronic R shoulder pain pending R shoulder replacement 07/2017.  [name] Esophageal stenosis March 2011   with transietn outlet obstruction by food, cleared by EGD   . Family history of adverse reaction to anesthesia    daughter PONV  . Gastric bypass status for obesity   . Headache(784.0)   . Hypertension   . IBS (irritable bowel syndrome)   . Left bundle branch block (LBBB)    a. Intermittently present - likely rate related. - patient denies  . Obesity   . Obstructive sleep apnea    using CPAP  . 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 Restless leg syndrome   . Rotator cuff arthropathy, right 08/13/2017  . Syncope and collapse 03/12/2014  . Type II diabetes mellitus (HCC)     Tobacco History: Social History   Tobacco Use  Smoking Status Never Smoker  Smokeless Tobacco Never Used   Counseling given: Not Answered   Outpatient Medications Prior to Visit  Medication Sig Dispense Refill  . albuterol (VENTOLIN HFA) 108 (90 Base) MCG/ACT inhaler Inhale 2 puffs into the lungs every 8 (eight) hours as needed for wheezing or shortness of breath. 8 g 2  . aspirin EC 81 MG tablet Take 1 tablet (81 mg total) by mouth daily. 30 tablet 2  . benzonatate (TESSALON) 200 MG capsule Take 1 capsule (200 mg total) by mouth 2 (two) times daily as needed for cough. 30 capsule 5  . blood glucose meter kit and supplies Use to check sugars up to three times daily. Dx code: E11.9 1 each 0  . Blood Glucose Monitoring Suppl (ONETOUCH VERIO) w/Device KIT 1 Device by Does not apply route daily. Use to test blood glucose once daily. Dx code = E11.9 1 kit 0  . buPROPion (WELLBUTRIN) 75 MG tablet TAKE 1-2 TABLETS (75-150 MG TOTAL) BY MOUTH SEE ADMIN INSTRUCTIONS. TAKE 2 TABLETS IN THE MORNING AND 1 TABLET IN THE AFTERNOON (Patient taking differently: 75 mg at bedtime. ) 270 tablet 1  .  butalbital-acetaminophen-caffeine (FIORICET) 50-325-40 MG tablet Take 1 tablet by mouth every 6 (six) hours as needed for headache. 90 tablet 5  . citalopram (CELEXA) 10 MG tablet Take 1 tablet (10 mg total) by mouth daily. 90 tablet 1  . clopidogrel (PLAVIX) 75 MG tablet Take 1 tablet (75 mg total) by mouth daily. 30 tablet 10  . clotrimazole-betamethasone (LOTRISONE) cream Apply 1 application topically 2 (two) times daily. (Patient taking differently: Apply 1 application topically 2 (two) times daily as needed (rash). ) 45 g 3  . Continuous Blood Gluc Receiver (FREESTYLE LIBRE 14 DAY READER) DEVI 1 applicator by Does not apply route 4 (four) times daily. 1 each 6  . Continuous Blood Gluc Sensor (FREESTYLE LIBRE 14 DAY SENSOR) MISC Use to test blood sugar 4 times daily 1 each 6  . cyanocobalamin (,VITAMIN B-12,) 1000 MCG/ML injection Inject 1 ml (  1000 mcg ) IM weekly x 4,  Then monthly thereafter (Patient taking differently: Inject 1,000 mcg into the muscle every 30 (thirty) days. On the 12th day) 110 mL 0  . diazepam (VALIUM) 5 MG tablet Take 1 tablet (5 mg total) by mouth at bedtime as needed for anxiety. 30 tablet 5  . dicyclomine (BENTYL) 10 MG capsule Take 1 capsule (10 mg total) by mouth 4 (four) times daily -  before meals and at bedtime. 360 capsule 3  . diphenhydrAMINE (BENADRYL) 25 MG tablet Take 50 mg by mouth daily as needed for allergies.    . diphenoxylate-atropine (LOMOTIL) 2.5-0.025 MG tablet Take 1 tablet by mouth 4 (four) times daily as needed for diarrhea or loose stools. 60 tablet 0  . DROPLET PEN NEEDLES 31G X 5 MM MISC USE ONE NEEDLE SUBCUTANEOUSLY AS DIRECTED (REMOVE AND DISCARD NEEDLE IN SHARPS CONTAINER IMMEDIATELY AFTER USE) 100 each 1  . Eszopiclone 3 MG TABS Take 1 tablet (3 mg total) by mouth at bedtime. 90 tablet 1  . Insulin Glargine (LANTUS SOLOSTAR) 100 UNIT/ML Solostar Pen Inject 11 Units into the skin daily. (Patient taking differently: Inject 10-15 Units into the  skin daily. ) 5 pen 3  . ipratropium (ATROVENT) 0.06 % nasal spray Place 2 sprays into both nostrils 4 (four) times daily. 15 mL 12  . Lancet Devices (ONE TOUCH DELICA LANCING DEV) MISC Use to test blood glucose once daily. Dx code = E11.9 1 each 0  . levETIRAcetam (KEPPRA) 750 MG tablet Take 1 tablet (750 mg total) by mouth at bedtime. (Patient taking differently: Take 750 mg by mouth at bedtime as needed (restless legs). ) 90 tablet 3  . nitroGLYCERIN (NITROSTAT) 0.4 MG SL tablet Place 1 tablet (0.4 mg total) under the tongue every 5 (five) minutes as needed for chest pain. 20 tablet 1  . NONFORMULARY OR COMPOUNDED [place]  Combination Pain Cream -  Baclofen 2%, Doxepin 5%, Gabapentin 6%, Topiramate 2%, Pentoxifylline 3% Apply 1-2 grams to affected area 3-4 times daily Qty. 120 gm 3 refills 1 each 3  . nystatin (NYAMYC) powder Apply 1 g topically 4 (four) times daily as needed (rash).     . ondansetron (ZOFRAN ODT) 4 MG disintegrating tablet Take 1 tablet (4 mg total) by mouth every 8 (eight) hours as needed for nausea or vomiting. 20 tablet 0  . ONETOUCH VERIO test strip daily.    [name] OVER THE COUNTER MEDICATION Apply 1 application topically daily as needed (pain). [place] Gell    . pregabalin (LYRICA) 100 MG capsule Take 100 mg by mouth at bedtime as needed (leg cramps).     . Syringe/Needle, Disp, (SYRINGE 3CC/25GX1") 25G X 1" 3 ML MISC Use for b12 injections 50 each 0  . traMADol (ULTRAM) 50 MG tablet Take 0.5 tablets (25 mg total) by mouth every 12 (twelve) hours as needed for severe pain. 30 tablet 0  . umeclidinium-vilanterol (ANORO ELLIPTA) 62.5-25 MCG/INH AEPB Inhale 1 puff into the lungs daily. 1 each 12  . atorvastatin (LIPITOR) 40 MG tablet Take 1 tablet (40 mg total) by mouth daily. (Patient not taking: Reported on 02/10/2020) 90 tablet 0   No facility-administered medications prior to visit.   Review of Systems  Review of Systems  Constitutional: Negative.     Respiratory: Negative for cough and shortness of breath.   Cardiovascular: Negative.    Physical Exam  BP 120/70 (BP Location: Left Arm, Cuff Size: Normal)   Pulse 81  Temp (!) 97.3 F (36.3 C) (Oral)   Ht 5' 4.5" (1.638 m)   Wt (!) 249 lb (112.9 kg)   SpO2 100%   BMI 42.08 kg/m  Physical Exam Constitutional:      General: She is not in acute distress.    Appearance: Normal appearance. She is obese. She is not ill-appearing.  HENT:     Head: Normocephalic and atraumatic.  Cardiovascular:     Rate and Rhythm: Normal rate and regular rhythm.     Comments: Trace BLE edema Pulmonary:     Effort: Pulmonary effort is normal.     Breath sounds: Normal breath sounds. No wheezing or rhonchi.  Musculoskeletal:     Comments: In [place]  Skin:    General: Skin is warm and dry.  Neurological:     General: No focal deficit present.     Mental Status: She is alert and oriented to person, place, and time. Mental status is at baseline.  Psychiatric:        Mood and Affect: Mood normal.        Behavior: Behavior normal.        Thought Content: Thought content normal.        Judgment: Judgment normal.      Lab Results:  CBC    Component Value Date/Time   WBC 10.4 01/25/2020 1203   RBC 4.14 01/25/2020 1203   HGB 10.5 (L) 01/25/2020 1203   HGB 13.1 06/21/2014 1349   HCT 32.9 (L) 01/25/2020 1203   HCT 41.4 06/21/2014 1349   PLT 325 01/25/2020 1203   PLT 308 06/21/2014 1349   MCV 79.5 (L) 01/25/2020 1203   MCV 88 06/21/2014 1349   MCH 25.4 (L) 01/25/2020 1203   MCHC 31.9 01/25/2020 1203   RDW 16.9 (H) 01/25/2020 1203   RDW 15.4 (H) 06/21/2014 1349   LYMPHSABS 1.8 01/25/2020 1203   LYMPHSABS 2.3 02/25/2014 2153   MONOABS 0.5 01/25/2020 1203   MONOABS 0.6 02/25/2014 2153   EOSABS 0.2 01/25/2020 1203   EOSABS 0.1 02/25/2014 2153   BASOSABS 0.1 01/25/2020 1203   BASOSABS 0.1 02/25/2014 2153    BMET    Component Value Date/Time   NA 139 01/25/2020 1203   NA 144 10/22/2018  1456   NA 141 06/21/2014 1349   K 3.9 01/25/2020 1203   K 4.0 06/21/2014 1349   CL 107 01/25/2020 1203   CL 106 06/21/2014 1349   CO2 20 (L) 01/25/2020 1203   CO2 24 06/21/2014 1349   GLUCOSE 187 (H) 01/25/2020 1203   GLUCOSE 174 (H) 06/21/2014 1349   BUN 17 01/25/2020 1203   BUN 11 10/22/2018 1456   BUN 12 06/21/2014 1349   CREATININE 1.11 (H) 01/25/2020 1203   CREATININE 0.88 03/06/2019 1624   CALCIUM 8.8 (L) 01/25/2020 1203   CALCIUM 8.7 06/21/2014 1349   GFRNONAA 49 (L) 01/25/2020 1203   GFRNONAA 57 (L) 06/21/2014 1349   GFRNONAA 55 (L) 02/25/2014 2153   GFRNONAA 70 12/13/2011 0919   GFRAA 57 (L) 01/25/2020 1203   GFRAA >60 06/21/2014 1349   GFRAA >60 02/25/2014 2153   GFRAA 80 12/13/2011 0919    BNP No results found for: BNP  ProBNP    Component Value Date/Time   PROBNP 80.0 12/03/2013 1335    Imaging: No results found.   Assessment & Plan:   OSA on CPAP - Patient is 100% compliant with CPAP and reports benefit from use - Pressure 4-12cm h20; AHI 1.7 -  No changes today - Continue to wear CPAP every night for min 4-6 hours or more - FU in 6 months with Dr. Annamaria Boots   S/P cardiac catheterization - S/p heart catheterization 01/04/20, two drug eluting stents placed LAD  - Dyspnea and cough greatly improved  - Needs dual antiplatelet treatment with ASA/Plavix for 6 months - Continue Atorvastatin 37m daily and cardiopulmonary rehab      EMartyn Ehrich NP 02/10/2020

## 2020-02-10 NOTE — Progress Notes (Signed)
Daily Session Note  Patient Details  Name: Denise Sharp MRN: [id number] Date of Birth: Nov 02, 1945 Referring Provider:     Cardiac Rehab from 01/25/2020 in [facility]  Referring Provider Arida (End)      Encounter Date: 02/10/2020  Check In:  Session Check In - 02/10/20 1359      Check-In   Supervising physician immediately available to respond to emergencies See telemetry face sheet for immediately available ER MD    Location [facility]iac [facility] Pulmonary [facility]    Staff Present Renita Papa, RN BSN;Jessica Luan Pulling, MA, RCEP, CCRP, CCET;Laureen Sprague, [place], RRT, CPFT;Joseph Hamlin [employer]    Virtual Visit No    Medication changes reported     No    Fall or balance concerns reported    No    Warm-up and Cool-down Performed on first and last piece of equipment    Resistance Training Performed Yes    VAD Patient? No    PAD/SET Patient? No      Pain Assessment   Currently in Pain? No/denies              Social History   Tobacco Use  Smoking Status Never Smoker  Smokeless Tobacco Never Used    Goals Met:  Independence with exercise equipment Exercise tolerated well No report of cardiac concerns or symptoms Strength training completed today  Goals Unmet:  Not Applicable  Comments: Pt able to follow exercise prescription today without complaint.  Will continue to monitor for progression.    Dr. Emily Filbert is Medical Director for [facility].

## 2020-02-10 NOTE — Assessment & Plan Note (Signed)
-   Patient is 100% compliant with CPAP and reports benefit from use - Pressure 4-12cm h20; AHI 1.7 - No changes today - Continue to wear CPAP every night for min 4-6 hours or more - FU in 6 months with Dr. Annamaria Boots

## 2020-02-10 NOTE — Progress Notes (Signed)

## 2020-02-10 NOTE — Assessment & Plan Note (Addendum)
-   S/p heart catheterization 01/04/20, two drug eluting stents placed LAD  - Dyspnea and cough greatly improved  - Needs dual antiplatelet treatment with ASA/Plavix for 6 months - Continue Atorvastatin 40mg  daily and cardiopulmonary rehab

## 2020-02-10 NOTE — Patient Instructions (Addendum)
Pleasure meeting you Denise Sharp  Recommendations: Continue Asprin and Plavix Continue Atorvastatin Continue CPAP every night for 4-6 hours or more  Continue cardiopulmonary rehab  Follow-up: 6 months with Dr. Annamaria Boots or sooner if needed   CPAP and BPAP Information CPAP and BPAP are methods of helping a person breathe with the use of air pressure. CPAP stands for "continuous positive airway pressure." BPAP stands for "bi-level positive airway pressure." In both methods, air is blown through your nose or mouth and into your air passages to help you breathe well. CPAP and BPAP use different amounts of pressure to blow air. With CPAP, the amount of pressure stays the same while you breathe in and out. With BPAP, the amount of pressure is increased when you breathe in (inhale) so that you can take larger breaths. Your health care provider will recommend whether CPAP or BPAP would be more helpful for you. Why are CPAP and BPAP treatments used? CPAP or BPAP can be helpful if you have:  Sleep apnea.  Chronic obstructive pulmonary disease (COPD).  Heart failure.  Medical conditions that weaken the muscles of the chest including muscular dystrophy, or neurological diseases such as amyotrophic lateral sclerosis (ALS).  Other problems that cause breathing to be weak, abnormal, or difficult. CPAP is most commonly used for obstructive sleep apnea (OSA) to keep the airways from collapsing when the muscles relax during sleep. How is CPAP or BPAP administered? Both CPAP and BPAP are provided by a small machine with a flexible plastic tube that attaches to a plastic mask. You wear the mask. Air is blown through the mask into your nose or mouth. The amount of pressure that is used to blow the air can be adjusted on the machine. Your health care provider will determine the pressure setting that should be used based on your individual needs. When should CPAP or BPAP be used? In most cases, the mask only  needs to be worn during sleep. Generally, the mask needs to be worn throughout the night and during any daytime naps. People with certain medical conditions may also need to wear the mask at other times when they are awake. Follow instructions from your health care provider about when to use the machine. What are some tips for using the mask?   Because the mask needs to be snug, some people feel trapped or closed-in (claustrophobic) when first using the mask. If you feel this way, you may need to get used to the mask. One way to do this is by holding the mask loosely over your nose or mouth and then gradually applying the mask more snugly. You can also gradually increase the amount of time that you use the mask.  Masks are available in various types and sizes. Some fit over your mouth and nose while others fit over just your nose. If your mask does not fit well, talk with your health care provider about getting a different one.  If you are using a mask that fits over your nose and you tend to breathe through your mouth, a chin strap may be applied to help keep your mouth closed.  The CPAP and BPAP machines have alarms that may sound if the mask comes off or develops a leak.  If you have trouble with the mask, it is very important that you talk with your health care provider about finding a way to make the mask easier to tolerate. Do not stop using the mask. Stopping the use of the  mask could have a negative impact on your health. What are some tips for using the machine?  Place your CPAP or BPAP machine on a secure table or stand near an electrical outlet.  Know where the on/off switch is located on the machine.  Follow instructions from your health care provider about how to set the pressure on your machine and when you should use it.  Do not eat or drink while the CPAP or BPAP machine is on. Food or fluids could get pushed into your lungs by the pressure of the CPAP or BPAP.  Do not smoke.  Tobacco smoke residue can damage the machine.  For home use, CPAP and BPAP machines can be rented or purchased through home health care companies. Many different brands of machines are available. Renting a machine before purchasing may help you find out which particular machine works well for you.  Keep the CPAP or BPAP machine and attachments clean. Ask your health care provider for specific instructions. Get help right away if:  You have redness or open areas around your nose or mouth where the mask fits.  You have trouble using the CPAP or BPAP machine.  You cannot tolerate wearing the CPAP or BPAP mask.  You have pain, discomfort, and bloating in your abdomen. Summary  CPAP and BPAP are methods of helping a person breathe with the use of air pressure.  Both CPAP and BPAP are provided by a small machine with a flexible plastic tube that attaches to a plastic mask.  If you have trouble with the mask, it is very important that you talk with your health care provider about finding a way to make the mask easier to tolerate. This information is not intended to replace advice given to you by your health care provider. Make sure you discuss any questions you have with your health care provider. Document Revised: 10/22/2018 Document Reviewed: 05/21/2016 Elsevier Patient Education  [place].

## 2020-02-11 ENCOUNTER — Ambulatory Visit: Payer: Medicare Other | Admitting: Physician Assistant

## 2020-02-12 ENCOUNTER — Other Ambulatory Visit: Payer: Self-pay

## 2020-02-12 ENCOUNTER — Ambulatory Visit (INDEPENDENT_AMBULATORY_CARE_PROVIDER_SITE_OTHER): Payer: Medicare Other | Admitting: Physician Assistant

## 2020-02-12 ENCOUNTER — Encounter: Payer: Self-pay | Admitting: Physician Assistant

## 2020-02-12 VITALS — BP 148/80 | HR 74 | Ht 64.5 in | Wt 249.5 lb

## 2020-02-12 DIAGNOSIS — I10 Essential (primary) hypertension: Secondary | ICD-10-CM | POA: Diagnosis not present

## 2020-02-12 DIAGNOSIS — I200 Unstable angina: Secondary | ICD-10-CM | POA: Diagnosis not present

## 2020-02-12 DIAGNOSIS — E785 Hyperlipidemia, unspecified: Secondary | ICD-10-CM

## 2020-02-12 DIAGNOSIS — I2510 Atherosclerotic heart disease of native coronary artery without angina pectoris: Secondary | ICD-10-CM

## 2020-02-12 DIAGNOSIS — R040 Epistaxis: Secondary | ICD-10-CM

## 2020-02-12 DIAGNOSIS — I5032 Chronic diastolic (congestive) heart failure: Secondary | ICD-10-CM | POA: Diagnosis not present

## 2020-02-12 NOTE — Patient Instructions (Signed)
Medication Instructions:  Your physician recommends that you continue on your current medications as directed. Please refer to the Current Medication list given to you today.  *If you need a refill on your cardiac medications before your next appointment, please call your pharmacy*   Lab Work: none ordered If you have labs (blood work) drawn today and your tests are completely normal, you will receive your results only by: [name] MyChart Message (if you have MyChart) OR . A paper copy in the mail If you have any lab test that is abnormal or we need to change your treatment, we will call you to review the results.   Testing/Procedures: none ordered   Follow-Up: At [facility], you and your health needs are our priority.  As part of our continuing mission to provide you with exceptional heart care, we have created designated Provider Care Teams.  These Care Teams include your primary Cardiologist (physician) and Advanced Practice Providers (APPs -  Physician Assistants and Nurse Practitioners) who all work together to provide you with the care you need, when you need it.  We recommend signing up for the patient portal called "MyChart".  Sign up information is provided on this After Visit Summary.  MyChart is used to connect with patients for Virtual Visits (Telemedicine).  Patients are able to view lab/test results, encounter notes, upcoming appointments, etc.  Non-urgent messages can be sent to your provider as well.   To learn more about what you can do with MyChart, go to [email or web address].    Your next appointment:   3 month(s)  The format for your next appointment:   In Person  Provider:   [name], MD   Other Instructions

## 2020-02-15 ENCOUNTER — Telehealth: Payer: Self-pay | Admitting: Internal Medicine

## 2020-02-15 ENCOUNTER — Other Ambulatory Visit: Payer: Self-pay

## 2020-02-15 ENCOUNTER — Ambulatory Visit (INDEPENDENT_AMBULATORY_CARE_PROVIDER_SITE_OTHER): Payer: Medicare Other | Admitting: Internal Medicine

## 2020-02-15 DIAGNOSIS — R269 Unspecified abnormalities of gait and mobility: Secondary | ICD-10-CM

## 2020-02-15 DIAGNOSIS — F330 Major depressive disorder, recurrent, mild: Secondary | ICD-10-CM

## 2020-02-15 DIAGNOSIS — I25110 Atherosclerotic heart disease of native coronary artery with unstable angina pectoris: Secondary | ICD-10-CM | POA: Diagnosis not present

## 2020-02-15 DIAGNOSIS — IMO0002 Reserved for concepts with insufficient information to code with codable children: Secondary | ICD-10-CM

## 2020-02-15 DIAGNOSIS — J310 Chronic rhinitis: Secondary | ICD-10-CM | POA: Insufficient documentation

## 2020-02-15 DIAGNOSIS — E1140 Type 2 diabetes mellitus with diabetic neuropathy, unspecified: Secondary | ICD-10-CM | POA: Diagnosis not present

## 2020-02-15 DIAGNOSIS — I200 Unstable angina: Secondary | ICD-10-CM

## 2020-02-15 DIAGNOSIS — H60501 Unspecified acute noninfective otitis externa, right ear: Secondary | ICD-10-CM | POA: Diagnosis not present

## 2020-02-15 DIAGNOSIS — R1310 Dysphagia, unspecified: Secondary | ICD-10-CM | POA: Diagnosis not present

## 2020-02-15 DIAGNOSIS — Z9889 Other specified postprocedural states: Secondary | ICD-10-CM

## 2020-02-15 DIAGNOSIS — E1165 Type 2 diabetes mellitus with hyperglycemia: Secondary | ICD-10-CM

## 2020-02-15 DIAGNOSIS — Z03818 Encounter for observation for suspected exposure to other biological agents ruled out: Secondary | ICD-10-CM

## 2020-02-15 DIAGNOSIS — Z20822 Contact with and (suspected) exposure to covid-19: Secondary | ICD-10-CM | POA: Diagnosis not present

## 2020-02-15 MED ORDER — NEOMYCIN-POLYMYXIN-HC 3.5-10000-1 OT SOLN
3.0000 [drp] | Freq: Four times a day (QID) | OTIC | 0 refills | Status: DC
Start: 2020-02-15 — End: 2020-07-22

## 2020-02-15 MED ORDER — HYOSCYAMINE SULFATE 0.125 MG PO TBDP: ORAL_TABLET | ORAL | 0 refills | Status: DC

## 2020-02-15 NOTE — Telephone Encounter (Signed)
FYI

## 2020-02-15 NOTE — Patient Instructions (Signed)
YOU ARE ADVISED TO GET COVID TESTED BECAUSE OF YOUR RUNNY NOSE   USE THE EAR DROPS 4 TIMES DAILY FOR OTITIS EXTERNA OF THE RIGHT EAR   DISSOLVE THE HYOSCYAMINE UNDER TONGUE  15 MINUTES BEFORE EATING  RESUME DAILY DOSE OF PROTONIX

## 2020-02-15 NOTE — Telephone Encounter (Signed)
Pt wants [name] to know her covid test was negative. She just have a stuff head a sore throat.

## 2020-02-15 NOTE — Assessment & Plan Note (Signed)
COVID TEST WAS NEGATIVE PER PATIENT TODAY

## 2020-02-15 NOTE — Progress Notes (Signed)
Subjective:  Patient ID: Denise Sharp, female    DOB: May 17, 1946  Age: 74 y.o. MRN: [id number]  CC: Diagnoses of Coronary artery disease involving native coronary artery of native heart with unstable angina pectoris ([place]), Abnormality of gait, Lab test negative for COVID-19 virus, Mild episode of recurrent major depressive disorder ([place]), Morbid obesity ([place]), S/P cardiac catheterization, Uncontrolled type 2 diabetes with neuropathy ([place]), Dysphagia, unspecified type, and Acute otitis externa of right ear, unspecified type were pertinent to this visit.  HPI [name] presents for follow up on chronic conditions , right ear pain,  And URI symptoms,  This visit occurred during the SARS-CoV-2 public health emergency.  Safety protocols were in place, including screening questions prior to the visit, additional usage of staff PPE, and extensive cleaning of exam room while observing appropriate contact time as indicated for disinfecting solutions.   Patient reported symptoms of right ear pain  Sinus congestion and rhinitis for the past week once she had been roomed.  She was asked to convert her visit to virtual/telephone but declined. Nurse and MD used appropriate gear and patient was strongly advised to be COVID TESTED after her visit, which she did.  She reports that her rapid test was negative.  1) Right ear pain:  Patient has been using the long nail of her 5th finger to attempt to remove earwax from ear.  She felt she has been unsuccessful, and she notes pain without discharge or bleeding.  She has been having clear rhinitis and sore throat  for several days without fevers,  New Cough, Sinus pain or headaches.  Patient's chronic cough has resolved since she underwent successful cardiac cath in June   2) patient reports intermittent dysphagia for solids  For the past several months.  She has had to vomit/regurgitate on several occasions,  Most recently on Sunday,  To clear her throat. The  majority of the episodes occur with food boluses that are dry in texture,  But has also had occurrence with cold liquids. She has a history of esophageal stricture with multiple dilations required in the past .  She is not taking a PPI for unclear reasons,  But has Pantoprazole rx at home.  She has recently undergone PCI with DES placement (see below).  Dual antiplatelet therapy is recommended for a minimum of 6 months . GI appt is scheduled for next week.   3) CAD:  S/p diagnostic/therapeutic cardiac cath June 17 with DES x 2 to mid and ostial LAD with resolution of stenosis from 80% to 0%.   She is participating in cardiac rehab but feels disappointed that they are not targeting her abdominal fat.  Reinforced with her the goal of cardiac rehab which is to improve her walking endurance .  4) Weight loss:  She had achieved substantia weight loss with strict adherence to KETO diet (diet enforced by daughter's intervention) but has had to modify based on cardiology's recommendations.  5) Type 2 DM:  Reviewed BS brought with her today Post prandials are < 160  80% of the time and fastings are < 130 25% of the time.  Free Style Libre monitor has been received by patient  but instruction on use has  Been deferred today given symptoms concerning for COVID.     Outpatient Medications Prior to Visit  Medication Sig Dispense Refill  . albuterol (VENTOLIN HFA) 108 (90 Base) MCG/ACT inhaler Inhale 2 puffs into the lungs every 8 (eight) hours as needed for  wheezing or shortness of breath. 8 g 2  . aspirin EC 81 MG tablet Take 1 tablet (81 mg total) by mouth daily. 30 tablet 2  . atorvastatin (LIPITOR) 40 MG tablet Take 1 tablet (40 mg total) by mouth daily. 90 tablet 0  . benzonatate (TESSALON) 200 MG capsule Take 1 capsule (200 mg total) by mouth 2 (two) times daily as needed for cough. 30 capsule 5  . blood glucose meter kit and supplies Use to check sugars up to three times daily. Dx code: E11.9 1 each 0  .  Blood Glucose Monitoring Suppl (ONETOUCH VERIO) w/Device KIT 1 Device by Does not apply route daily. Use to test blood glucose once daily. Dx code = E11.9 1 kit 0  . buPROPion (WELLBUTRIN) 75 MG tablet TAKE 1-2 TABLETS (75-150 MG TOTAL) BY MOUTH SEE ADMIN INSTRUCTIONS. TAKE 2 TABLETS IN THE MORNING AND 1 TABLET IN THE AFTERNOON (Patient taking differently: 75 mg at bedtime. ) 270 tablet 1  . butalbital-acetaminophen-caffeine (FIORICET) 50-325-40 MG tablet Take 1 tablet by mouth every 6 (six) hours as needed for headache. 90 tablet 5  . citalopram (CELEXA) 10 MG tablet Take 1 tablet (10 mg total) by mouth daily. 90 tablet 1  . clopidogrel (PLAVIX) 75 MG tablet Take 1 tablet (75 mg total) by mouth daily. 30 tablet 10  . clotrimazole-betamethasone (LOTRISONE) cream Apply 1 application topically 2 (two) times daily. (Patient taking differently: Apply 1 application topically 2 (two) times daily as needed (rash). ) 45 g 3  . Continuous Blood Gluc Receiver (FREESTYLE LIBRE 14 DAY READER) DEVI 1 applicator by Does not apply route 4 (four) times daily. 1 each 6  . Continuous Blood Gluc Sensor (FREESTYLE LIBRE 14 DAY SENSOR) MISC Use to test blood sugar 4 times daily 1 each 6  . cyanocobalamin (,VITAMIN B-12,) 1000 MCG/ML injection Inject 1 ml (1000 mcg ) IM weekly x 4,  Then monthly thereafter (Patient taking differently: Inject 1,000 mcg into the muscle every 30 (thirty) days. On the 12th day) 110 mL 0  . diazepam (VALIUM) 5 MG tablet Take 1 tablet (5 mg total) by mouth at bedtime as needed for anxiety. 30 tablet 5  . dicyclomine (BENTYL) 10 MG capsule Take 1 capsule (10 mg total) by mouth 4 (four) times daily -  before meals and at bedtime. 360 capsule 3  . diphenhydrAMINE (BENADRYL) 25 MG tablet Take 50 mg by mouth daily as needed for allergies.    . diphenoxylate-atropine (LOMOTIL) 2.5-0.025 MG tablet Take 1 tablet by mouth 4 (four) times daily as needed for diarrhea or loose stools. 60 tablet 0  . DROPLET  PEN NEEDLES 31G X 5 MM MISC USE ONE NEEDLE SUBCUTANEOUSLY AS DIRECTED (REMOVE AND DISCARD NEEDLE IN SHARPS CONTAINER IMMEDIATELY AFTER USE) 100 each 1  . Eszopiclone 3 MG TABS Take 1 tablet (3 mg total) by mouth at bedtime. 90 tablet 1  . Insulin Glargine (LANTUS SOLOSTAR) 100 UNIT/ML Solostar Pen Inject 11 Units into the skin daily. (Patient taking differently: Inject 10-15 Units into the skin daily. ) 5 pen 3  . ipratropium (ATROVENT) 0.06 % nasal spray Place 2 sprays into both nostrils 4 (four) times daily. 15 mL 12  . Lancet Devices (ONE TOUCH DELICA LANCING DEV) MISC Use to test blood glucose once daily. Dx code = E11.9 1 each 0  . levETIRAcetam (KEPPRA) 750 MG tablet Take 1 tablet (750 mg total) by mouth at bedtime. (Patient taking differently: Take 750 mg by mouth  at bedtime as needed (restless legs). ) 90 tablet 3  . nitroGLYCERIN (NITROSTAT) 0.4 MG SL tablet Place 1 tablet (0.4 mg total) under the tongue every 5 (five) minutes as needed for chest pain. 20 tablet 1  . NONFORMULARY OR COMPOUNDED [place]  Combination Pain Cream -  Baclofen 2%, Doxepin 5%, Gabapentin 6%, Topiramate 2%, Pentoxifylline 3% Apply 1-2 grams to affected area 3-4 times daily Qty. 120 gm 3 refills 1 each 3  . nystatin (NYAMYC) powder Apply 1 g topically 4 (four) times daily as needed (rash).     . ondansetron (ZOFRAN ODT) 4 MG disintegrating tablet Take 1 tablet (4 mg total) by mouth every 8 (eight) hours as needed for nausea or vomiting. 20 tablet 0  . ONETOUCH VERIO test strip daily.    [name] OVER THE COUNTER MEDICATION Apply 1 application topically daily as needed (pain). [place] Gell    . pregabalin (LYRICA) 100 MG capsule Take 100 mg by mouth at bedtime as needed (leg cramps).     . Syringe/Needle, Disp, (SYRINGE 3CC/25GX1") 25G X 1" 3 ML MISC Use for b12 injections 50 each 0  . traMADol (ULTRAM) 50 MG tablet Take 0.5 tablets (25 mg total) by mouth every 12 (twelve) hours as needed for severe pain. 30  tablet 0  . umeclidinium-vilanterol (ANORO ELLIPTA) 62.5-25 MCG/INH AEPB Inhale 1 puff into the lungs daily. 1 each 12   No facility-administered medications prior to visit.    Review of Systems;  Patient denies headache, fevers, malaise, unintentional weight loss, skin rash, eye pain, sinus congestion and sinus pain, sore throat, dysphagia,  hemoptysis , cough, dyspnea, wheezing, chest pain, palpitations, orthopnea, edema, abdominal pain, nausea, melena, diarrhea, constipation, flank pain, dysuria, hematuria, urinary  Frequency, nocturia, numbness, tingling, seizures,  Focal weakness, Loss of consciousness,  Tremor, insomnia, depression, anxiety, and suicidal ideation.      Objective:  There were no vitals taken for this visit.  BP Readings from Last 3 Encounters:  02/12/20 (!) 148/80  02/10/20 120/70  01/13/20 124/70    Wt Readings from Last 3 Encounters:  02/12/20 (!) 249 lb 8 oz (113.2 kg)  02/10/20 (!) 249 lb (112.9 kg)  01/25/20 241 lb 3.2 oz (109.4 kg)    General appearance: alert, cooperative and appears well and  stated age Ears:  normal TM's bilaterally,  No cerumen accumulation.  Right external canal erythematous and crusted.  Throat: lips, mucosa, and tongue normal; teeth and gums normal Neck: no cervical adenopathy, no carotid bruit, supple, symmetrical, trachea midline and thyroid not enlarged, symmetric, no tenderness/mass/nodules Back: symmetric, no curvature. ROM normal. No CVA tenderness. Lungs: clear to auscultation bilaterally Heart: regular rate and rhythm, S1, S2 normal, no murmur, click, rub or gallop Abdomen: soft, non-tender; bowel sounds normal; no masses,  no organomegaly Pulses: 2+ and symmetric Skin: Skin color, texture, turgor normal. No rashes or lesions Lymph nodes: Cervical, supraclavicular, and axillary nodes normal. Gait:  Waddling and unsteady   Lab Results  Component Value Date   HGBA1C 7.6 (H) 01/25/2020   HGBA1C 7.7 (H) 12/31/2019    HGBA1C 7.2 (H) 06/09/2019    Lab Results  Component Value Date   CREATININE 1.11 (H) 01/25/2020   CREATININE 1.01 01/22/2020   CREATININE 1.22 (H) 01/13/2020    Lab Results  Component Value Date   WBC 10.4 01/25/2020   HGB 10.5 (L) 01/25/2020   HCT 32.9 (L) 01/25/2020   PLT 325 01/25/2020   GLUCOSE 187 (H) 01/25/2020  CHOL 183 12/31/2019   TRIG 140 12/31/2019   HDL 42 12/31/2019   LDLDIRECT 111.0 06/16/2015   LDLCALC 113 (H) 12/31/2019   ALT 10 11/03/2019   AST 14 11/03/2019   NA 139 01/25/2020   K 3.9 01/25/2020   CL 107 01/25/2020   CREATININE 1.11 (H) 01/25/2020   BUN 17 01/25/2020   CO2 20 (L) 01/25/2020   TSH 3.50 01/01/2019   INR 1.1 12/31/2019   HGBA1C 7.6 (H) 01/25/2020   MICROALBUR 2.8 (H) 06/04/2017    No results found.  Assessment & Plan:   Problem List Items Addressed This Visit      Unprioritized   Abnormality of gait (Chronic)    Multifactorial , with deconditioning leading to core and proximal muscle weakness to be addressed by cardiac rehab.       Coronary artery disease involving native coronary artery of native heart with unstable angina pectoris ([place])   Dysphagia    She has been having intermittent dysphagia for solids and liquids and has a history of recurrent esophageal stricture.  ( Per patient  "so many times they said I couldn't have anymore" )>  Last EGD 2014 by Dionne Milo,  And dilation of a benign appearing stricture was done (report in chart) .  Omeprazole 20 mg bid was prescribed at that time.   Referral to Dr Allen Norris for evaluation is underway.  Advised to resume pantoprazole or alternative PPI  IN PERPETUDE and trial of hyoscyamine in the event that she is having spasms.  Barium swallow ordered.       Relevant Orders   DG ESOPHAGUS W SINGLE CM (SOL OR THIN BA)   Lab test negative for COVID-19 virus    She was rapid tested after today's visit and negative per patient.       Major depressive disorder with current active episode     Symptoms have improved on current therapy of citalopram 10 mg and wellbutrin  225 mg in divided doses .  No changes today       Morbid obesity ([place])    I have congratulated her in reduction of   BMI and encouraged  Continued weight loss with goal of 10% of body weight over the next 6 months using a low glycemic index diet and regular exercise a minimum of 5 days per week.        Otitis externa of right ear    Antibiotic drops prescribed.  Patient advised to refrain from inserting fingernails and other forein objects into ears to clear wax and to use Debrox instead in the future       S/P cardiac catheterization    She is s/p PCI and DES to mid and ostial LAD lesions. Dual antiplatelet therapy must continue at least until December 2021 per cardiology      Uncontrolled type 2 diabetes with neuropathy ([place])    Goal is a1c < 7.0  Continue 12 units Lantus daily  Given review of CBGS today and change in diet /activity to avoid lows.    Lab Results  Component Value Date   HGBA1C 7.6 (H) 01/25/2020           I provided 40 minutes of  face-to-face time during this encounter reviewing patient's current problems and past surgeries, labs and imaging studies, providing counseling on the above mentioned problems , and coordination  of care .  I am having Tia Masker. [place] start on hyoscyamine and neomycin-polymyxin-hydrocortisone. I am also having her maintain  her traMADol, nystatin, pregabalin, SYRINGE 3CC/25GX1", NONFORMULARY OR COMPOUNDED ITEM, ONE TOUCH DELICA LANCING DEV, clotrimazole-betamethasone, levETIRAcetam, cyanocobalamin, OneTouch Verio, Anoro Ellipta, Lantus SoloStar, butalbital-acetaminophen-caffeine, OVER THE COUNTER MEDICATION, diphenhydrAMINE, dicyclomine, buPROPion, ondansetron, albuterol, ipratropium, FreeStyle Libre 14 Day Reader, FreeStyle Libre 14 Day Sensor, blood glucose meter kit and supplies, diazepam, diphenoxylate-atropine, Eszopiclone, aspirin EC, nitroGLYCERIN,  OneTouch Verio, benzonatate, citalopram, atorvastatin, Droplet Pen Needles, and clopidogrel.  Meds ordered this encounter  Medications  . hyoscyamine (ANASPAZ) 0.125 MG TBDP disintergrating tablet    Sig: Dissolve under tongue 15 minutes before meals    Dispense:  60 tablet    Refill:  0  . neomycin-polymyxin-hydrocortisone (CORTISPORIN) OTIC solution    Sig: Place 3 drops into both ears 4 (four) times daily. For otitis externa    Dispense:  10 mL    Refill:  0    There are no discontinued medications.  Follow-up: No follow-ups on file.   [name], MD

## 2020-02-16 DIAGNOSIS — H6091 Unspecified otitis externa, right ear: Secondary | ICD-10-CM | POA: Insufficient documentation

## 2020-02-16 NOTE — Assessment & Plan Note (Addendum)
She has been having intermittent dysphagia for solids and liquids and has a history of recurrent esophageal stricture.  ( Per patient  "so many times they said I couldn't have anymore" )>  Last EGD 2014 by Dionne Milo,  And dilation of a benign appearing stricture was done (report in chart) .  Omeprazole 20 mg bid was prescribed at that time.   Referral to Dr Allen Norris for evaluation is underway.  Advised to resume pantoprazole or alternative PPI  IN PERPETUDE and trial of hyoscyamine in the event that she is having spasms.  Barium swallow ordered.

## 2020-02-16 NOTE — Assessment & Plan Note (Signed)
She is s/p PCI and DES to mid and ostial LAD lesions. Dual antiplatelet therapy must continue at least until December 2021 per cardiology

## 2020-02-16 NOTE — Assessment & Plan Note (Signed)
She was rapid tested after today's visit and negative per patient.

## 2020-02-16 NOTE — Assessment & Plan Note (Signed)
I have congratulated her in reduction of   BMI and encouraged  Continued weight loss with goal of 10% of body weight over the next 6 months using a low glycemic index diet and regular exercise a minimum of 5 days per week.     

## 2020-02-16 NOTE — Assessment & Plan Note (Signed)
Symptoms have improved on current therapy of citalopram 10 mg and wellbutrin  225 mg in divided doses .  No changes today

## 2020-02-16 NOTE — Assessment & Plan Note (Signed)
Antibiotic drops prescribed.  Patient advised to refrain from inserting fingernails and other forein objects into ears to clear wax and to use Debrox instead in the future

## 2020-02-16 NOTE — Assessment & Plan Note (Addendum)
Goal is a1c < 7.0  Continue 12 units Lantus daily  Given review of CBGS today and change in diet /activity to avoid lows.    Lab Results  Component Value Date   HGBA1C 7.6 (H) 01/25/2020

## 2020-02-16 NOTE — Assessment & Plan Note (Signed)
Multifactorial , with deconditioning leading to core and proximal muscle weakness to be addressed by cardiac rehab.

## 2020-02-17 ENCOUNTER — Other Ambulatory Visit: Payer: Self-pay

## 2020-02-17 ENCOUNTER — Encounter: Payer: Medicare Other | Attending: Cardiovascular Disease | Admitting: *Deleted

## 2020-02-17 DIAGNOSIS — G4733 Obstructive sleep apnea (adult) (pediatric): Secondary | ICD-10-CM | POA: Diagnosis not present

## 2020-02-17 DIAGNOSIS — Z79899 Other long term (current) drug therapy: Secondary | ICD-10-CM | POA: Insufficient documentation

## 2020-02-17 DIAGNOSIS — Z7902 Long term (current) use of antithrombotics/antiplatelets: Secondary | ICD-10-CM | POA: Diagnosis not present

## 2020-02-17 DIAGNOSIS — Z955 Presence of coronary angioplasty implant and graft: Secondary | ICD-10-CM | POA: Insufficient documentation

## 2020-02-17 DIAGNOSIS — E118 Type 2 diabetes mellitus with unspecified complications: Secondary | ICD-10-CM | POA: Diagnosis not present

## 2020-02-17 DIAGNOSIS — G2581 Restless legs syndrome: Secondary | ICD-10-CM | POA: Insufficient documentation

## 2020-02-17 DIAGNOSIS — F329 Major depressive disorder, single episode, unspecified: Secondary | ICD-10-CM | POA: Insufficient documentation

## 2020-02-17 DIAGNOSIS — Z794 Long term (current) use of insulin: Secondary | ICD-10-CM | POA: Insufficient documentation

## 2020-02-17 DIAGNOSIS — I447 Left bundle-branch block, unspecified: Secondary | ICD-10-CM | POA: Diagnosis not present

## 2020-02-17 DIAGNOSIS — I10 Essential (primary) hypertension: Secondary | ICD-10-CM | POA: Diagnosis not present

## 2020-02-17 DIAGNOSIS — Z7982 Long term (current) use of aspirin: Secondary | ICD-10-CM | POA: Diagnosis not present

## 2020-02-17 NOTE — Progress Notes (Signed)
Daily Session Note  Patient Details  Name: Denise Sharp MRN: [id number] Date of Birth: Oct 30, 1945 Referring Provider:     Cardiac Rehab from 01/25/2020 in [facility]  Referring Provider Arida (End)      Encounter Date: 02/17/2020  Check In:  Session Check In - 02/17/20 1420      Check-In   Supervising physician immediately available to respond to emergencies See telemetry face sheet for immediately available ER MD    Location [facility]-Cardiac & Pulmonary Rehab    Staff Present Renita Papa, RN BSN;Joseph Lou Miner, [place] Exercise Physiologist;Susanne Bice, RN, BSN, CCRP    Virtual Visit No    Medication changes reported     No    Fall or balance concerns reported    No    Warm-up and Cool-down Performed on first and last piece of equipment    Resistance Training Performed Yes    VAD Patient? No    PAD/SET Patient? No      Pain Assessment   Currently in Pain? No/denies              Social History   Tobacco Use  Smoking Status Never Smoker  Smokeless Tobacco Never Used    Goals Met:  Independence with exercise equipment Exercise tolerated well No report of cardiac concerns or symptoms Strength training completed today  Goals Unmet:  Not Applicable  Comments: Pt able to follow exercise prescription today without complaint.  Will continue to monitor for progression.    Dr. Emily Filbert is Medical Director for [place] and [facility].

## 2020-02-18 ENCOUNTER — Other Ambulatory Visit: Payer: Self-pay

## 2020-02-18 ENCOUNTER — Encounter: Payer: Medicare Other | Admitting: *Deleted

## 2020-02-18 DIAGNOSIS — F329 Major depressive disorder, single episode, unspecified: Secondary | ICD-10-CM | POA: Diagnosis not present

## 2020-02-18 DIAGNOSIS — G2581 Restless legs syndrome: Secondary | ICD-10-CM | POA: Diagnosis not present

## 2020-02-18 DIAGNOSIS — Z955 Presence of coronary angioplasty implant and graft: Secondary | ICD-10-CM | POA: Diagnosis not present

## 2020-02-18 DIAGNOSIS — G4733 Obstructive sleep apnea (adult) (pediatric): Secondary | ICD-10-CM | POA: Diagnosis not present

## 2020-02-18 DIAGNOSIS — I447 Left bundle-branch block, unspecified: Secondary | ICD-10-CM | POA: Diagnosis not present

## 2020-02-18 DIAGNOSIS — I10 Essential (primary) hypertension: Secondary | ICD-10-CM | POA: Diagnosis not present

## 2020-02-18 NOTE — Progress Notes (Signed)
Daily Session Note  Patient Details  Name: Denise Sharp MRN: [id number] Date of Birth: Nov 16, 1945 Referring Provider:     Cardiac Rehab from 01/25/2020 in [facility]  Referring Provider Arida (End)      Encounter Date: 02/18/2020  Check In:  Session Check In - 02/18/20 1425      Check-In   Supervising physician immediately available to respond to emergencies See telemetry face sheet for immediately available ER MD    Location [facility]-Cardiac & Pulmonary Rehab    Staff Present Renita Papa, RN BSN;Joseph Hood RCP,RRT,BSRT;Laureen Hercules, [place], RRT, CPFT    Virtual Visit No    Medication changes reported     No    Fall or balance concerns reported    No    Warm-up and Cool-down Performed on first and last piece of equipment    Resistance Training Performed Yes    VAD Patient? No    PAD/SET Patient? No      Pain Assessment   Currently in Pain? No/denies              Social History   Tobacco Use  Smoking Status Never Smoker  Smokeless Tobacco Never Used    Goals Met:  Independence with exercise equipment Exercise tolerated well No report of cardiac concerns or symptoms Strength training completed today  Goals Unmet:  Not Applicable  Comments: Pt able to follow exercise prescription today without complaint.  Will continue to monitor for progression.    Dr. Emily Filbert is Medical Director for [place] and [facility].

## 2020-02-19 ENCOUNTER — Other Ambulatory Visit: Payer: Self-pay | Admitting: Internal Medicine

## 2020-02-22 ENCOUNTER — Encounter: Payer: Medicare Other | Admitting: *Deleted

## 2020-02-22 ENCOUNTER — Other Ambulatory Visit: Payer: Self-pay

## 2020-02-22 ENCOUNTER — Other Ambulatory Visit: Payer: Self-pay | Admitting: Internal Medicine

## 2020-02-22 DIAGNOSIS — F329 Major depressive disorder, single episode, unspecified: Secondary | ICD-10-CM | POA: Diagnosis not present

## 2020-02-22 DIAGNOSIS — I447 Left bundle-branch block, unspecified: Secondary | ICD-10-CM | POA: Diagnosis not present

## 2020-02-22 DIAGNOSIS — G2581 Restless legs syndrome: Secondary | ICD-10-CM | POA: Diagnosis not present

## 2020-02-22 DIAGNOSIS — Z955 Presence of coronary angioplasty implant and graft: Secondary | ICD-10-CM

## 2020-02-22 DIAGNOSIS — I10 Essential (primary) hypertension: Secondary | ICD-10-CM | POA: Diagnosis not present

## 2020-02-22 DIAGNOSIS — G4733 Obstructive sleep apnea (adult) (pediatric): Secondary | ICD-10-CM | POA: Diagnosis not present

## 2020-02-22 NOTE — Progress Notes (Signed)
Daily Session Note  Patient Details  Name: Denise Sharp MRN: [id number] Date of Birth: February 24, 1946 Referring Provider:     Cardiac Rehab from 01/25/2020 in [facility] Cardiac and Pulmonary Rehab  Referring Provider Arida (End)      Encounter Date: 02/22/2020  Check In:  Session Check In - 02/22/20 1353      Check-In   Supervising physician immediately available to respond to emergencies See telemetry face sheet for immediately available ER MD    Location [facility]-Cardiac & Pulmonary Rehab    Staff Present Renita Papa, RN BSN;Jessica Luan Pulling, MA, RCEP, CCRP, Marylynn Pearson, MS Exercise Physiologist    Virtual Visit No    Medication changes reported     No    Fall or balance concerns reported    No    Warm-up and Cool-down Performed on first and last piece of equipment    Resistance Training Performed Yes    VAD Patient? No    PAD/SET Patient? No      Pain Assessment   Currently in Pain? No/denies              Social History   Tobacco Use  Smoking Status Never Smoker  Smokeless Tobacco Never Used    Goals Met:  Independence with exercise equipment Exercise tolerated well No report of cardiac concerns or symptoms Strength training completed today  Goals Unmet:  Not Applicable  Comments: Pt able to follow exercise prescription today without complaint.  Will continue to monitor for progression.    Dr. Emily Filbert is Medical Director for [place] and [facility].

## 2020-02-23 ENCOUNTER — Encounter: Payer: Self-pay | Admitting: Gastroenterology

## 2020-02-23 ENCOUNTER — Ambulatory Visit (INDEPENDENT_AMBULATORY_CARE_PROVIDER_SITE_OTHER): Payer: Medicare Other | Admitting: Gastroenterology

## 2020-02-23 ENCOUNTER — Other Ambulatory Visit: Payer: Self-pay

## 2020-02-23 VITALS — BP 132/59 | HR 87 | Ht 64.5 in | Wt 252.4 lb

## 2020-02-23 DIAGNOSIS — R1310 Dysphagia, unspecified: Secondary | ICD-10-CM

## 2020-02-23 DIAGNOSIS — I200 Unstable angina: Secondary | ICD-10-CM

## 2020-02-23 DIAGNOSIS — R1319 Other dysphagia: Secondary | ICD-10-CM

## 2020-02-23 NOTE — Progress Notes (Signed)
Gastroenterology Consultation  Referring Provider:     [name], MD Primary Care Physician:  [name], MD Primary Gastroenterologist:  Dr. Allen Norris     Reason for Consultation:     Dysphagia        HPI:   Denise Sharp is a very pleasant 74 y.o. y/o female referred for consultation & management of dysphagia by [name], Aris Everts, MD.  This patient comes in today with a report of dysphagia.  She had seen me in the past for diarrhea and she now reports that the diarrhea is mostly under control.  She reports that she had a cardiac catheterization with stent placement recently and was told that she cannot be off of her Plavix for the next 6 months.  The patient also was seen at the GI practice in [facility] by Dr. Carol Ada.  At that time he had mentioned in his note that the patient had seen me and Dr. Vira Agar for dysphagia and that we both had stated that we would not do an upper endoscopy on the patient because she has had repeated dilations in the past.  This is incorrect and has been verified with the patient that she has not seen me for dysphagia in the past.  The patient now reports that she gets choked with multiple different foods.  The patient reports that she will have to spit up the food when it will not go down and she feels like it is choking her and she is not able to get her breath.  Despite her inability to eat the patient denies that she has had any unexplained weight loss.  There is also no report of any black stools or bloody stools.  She also denies any hematemesis.  The last time she was seen for this in [place] was back in January 2019.  Past Medical History:  Diagnosis Date  . Abnormality of gait 03/25/2013  . Adrenal mass, left ([place])   . Anemia    iron deficient post  2 unit txfsn 2009, normal endo/colonoscopy by [facility]  . Arthritis   . Atypical chest pain    a. 04/1986 Cath ([facility]): nl cors, EF 65%; b. 07/2012 [facility]): LM nl, LAD/LCX/RCA w/ min  irregs; c. 07/2016 MV [facility]): "Equivocal" w/ small inf wall rev defect and hyperdynamic LV contraction, EF 87%; d. 08/2016 Cardiac CT Ca2+ score [facility]): Ca2+ score 1548; e. 05/2019 MV: No ischemia. Diaph atten. EF 75%.  . Cervical spinal stenosis 1994   due to trauma to back ([employer] accident), has intermittent paralysis and parasthesias  . Cervicogenic headache 03/23/2014  . Depression   . Diastolic dysfunction    a. 12/2019 Echo: EF 65-70%, Gr2 DD. No significant valvular dzs.  . Dizziness    chronic dizziness  . DJD (degenerative joint disease)    a. Chronic R shoulder pain pending R shoulder replacement 07/2017.  [name] Esophageal stenosis March 2011   with transietn outlet obstruction by food, cleared by EGD   . Family history of adverse reaction to anesthesia    daughter PONV  . Gastric bypass status for obesity   . Headache(784.0)   . Hypertension   . IBS (irritable bowel syndrome)   . Left bundle branch block (LBBB)    a. Intermittently present - likely rate related. - patient denies  . Obesity   . Obstructive sleep apnea    using CPAP  . 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 Restless leg syndrome   .  Rotator cuff arthropathy, right 08/13/2017  . Syncope and collapse 03/12/2014  . Type II diabetes mellitus ([place])     Past Surgical History:  Procedure Laterality Date  . ABDOMINAL HYSTERECTOMY    . APPENDECTOMY    . COLONOSCOPY    . CORONARY STENT INTERVENTION N/A 01/04/2020   Procedure: CORONARY STENT INTERVENTION;  Surgeon: [name], MD;  Location: [place] [facility];  Service: Cardiovascular;  Laterality: N/A;  LAD   . DIAPHRAGMATIC HERNIA REPAIR  2015  . ESOPHAGEAL DILATION     multiple  . ESOPHAGOGASTRODUODENOSCOPY    . [place]  . GALLBLADDER SURGERY  resection  . GASTRIC BYPASS    . GASTRIC BYPASS  2000, 2005   Dr. Debroah Loop  . [facility] IMPLANT PLACEMENT  April 2013   [place]  . JOINT REPLACEMENT  2007   bilateral knee. [name],  [name]   . LEFT HEART CATH AND CORS/GRAFTS ANGIOGRAPHY N/A 12/31/2019   Procedure: LEFT HEART CATH AND CORS/GRAFTS ANGIOGRAPHY;  Surgeon: [name], MD;  Location: [place] [facility];  Service: Cardiovascular;  Laterality: N/A;  . PANNICULECTOMY  06/16/2019  . PANNICULECTOMY N/A 06/16/2019   Procedure: PANNICULECTOMY;  Surgeon: [name], MD;  Location: [place];  Service: Plastics;  Laterality: N/A;  3 hours, please  . REVERSE SHOULDER ARTHROPLASTY Right 08/13/2017   Procedure: REVERSE RIGHT SHOULDER ARTHROPLASTY;  Surgeon: [name], MD;  Location: [place];  Service: Orthopedics;  Laterality: Right;  . ROTATOR CUFF REPAIR     right  . [place]  . TOTAL ABDOMINAL HYSTERECTOMY W/ BILATERAL SALPINGOOPHORECTOMY  1974  . UMBILICAL HERNIA REPAIR  Aug 11, 2015    Prior to Admission medications   Medication Sig Start Date End Date Taking? Authorizing Provider  albuterol (VENTOLIN HFA) 108 (90 Base) MCG/ACT inhaler Inhale 2 puffs into the lungs every 8 (eight) hours as needed for wheezing or shortness of breath. 08/07/19  Yes [name], MD  aspirin EC 81 MG tablet Take 1 tablet (81 mg total) by mouth daily. 01/05/20  Yes Fritzi Mandes, MD  atorvastatin (LIPITOR) 40 MG tablet Take 1 tablet (40 mg total) by mouth daily. 02/03/20  Yes End, Harrell Gave, MD  benzonatate (TESSALON) 200 MG capsule Take 1 capsule (200 mg total) by mouth 2 (two) times daily as needed for cough. 01/13/20  Yes [name], MD  blood glucose meter kit and supplies Use to check sugars up to three times daily. Dx code: E11.9 10/22/19  Yes [name], MD  Blood Glucose Monitoring Suppl (ONETOUCH VERIO) w/Device KIT 1 Device by Does not apply route daily. Use to test blood glucose once daily. Dx code = E11.9 12/30/18  Yes [name], MD  buPROPion (WELLBUTRIN) 75 MG tablet TAKE 2 TABLETS BY MOUTH EVERY MORNING AND 1 TABLET IN THE AFTERNOON 02/19/20  Yes [name], MD    butalbital-acetaminophen-caffeine (FIORICET) 50-325-40 MG tablet Take 1 tablet by mouth every 6 (six) hours as needed for headache. 05/28/19  Yes [name], MD  citalopram (CELEXA) 10 MG tablet Take 1 tablet (10 mg total) by mouth daily. 01/20/20  Yes [name], MD  clopidogrel (PLAVIX) 75 MG tablet Take 1 tablet (75 mg total) by mouth daily. 02/04/20  Yes End, Harrell Gave, MD  clotrimazole-betamethasone (LOTRISONE) cream Apply 1 application topically 2 (two) times daily. Patient taking differently: Apply 1 application topically 2 (two) times daily as needed (rash).  01/24/18  Yes Gae Dry, MD  Continuous Blood Gluc Receiver (FREESTYLE LIBRE 14 DAY READER) DEVI 1 applicator by Does not apply route 4 (four) times daily. 10/02/19  Yes [name], MD  Continuous Blood Gluc Sensor (FREESTYLE LIBRE 14 DAY SENSOR) MISC Use to test blood sugar 4 times daily 10/02/19  Yes [name], MD  cyanocobalamin (,VITAMIN B-12,) 1000 MCG/ML injection Inject 1 ml (1000 mcg ) IM weekly x 4,  Then monthly thereafter Patient taking differently: Inject 1,000 mcg into the muscle every 30 (thirty) days. On the 12th day 09/25/18  Yes [name], MD  diazepam (VALIUM) 5 MG tablet Take 1 tablet (5 mg total) by mouth at bedtime as needed for anxiety. 11/27/19  Yes [name], MD  dicyclomine (BENTYL) 10 MG capsule Take 1 capsule (10 mg total) by mouth 4 (four) times daily -  before meals and at bedtime. 07/29/19  Yes [name], MD  diphenhydrAMINE (BENADRYL) 25 MG tablet Take 50 mg by mouth daily as needed for allergies.   Yes [provider]  diphenoxylate-atropine (LOMOTIL) 2.5-0.025 MG tablet Take 1 tablet by mouth 4 (four) times daily as needed for diarrhea or loose stools. 11/27/19  Yes [name], MD  DROPLET PEN NEEDLES 31G X 5 MM MISC USE ONE NEEDLE SUBCUTANEOUSLY AS DIRECTED (REMOVE AND DISCARD NEEDLE IN SHARPS CONTAINER IMMEDIATELY AFTER USE) 02/04/20  Yes [name], MD  Eszopiclone 3 MG TABS Take 1 tablet (3 mg total) by mouth at bedtime. 11/27/19  Yes [name], MD  hyoscyamine (ANASPAZ) 0.125 MG TBDP disintergrating tablet DISSOLVE UNDER TONGUE 15 MINUTES BEFORE MEALS 02/22/20  Yes [name], MD  Insulin Glargine (LANTUS SOLOSTAR) 100 UNIT/ML Solostar Pen Inject 11 Units into the skin daily. Patient taking differently: Inject 10-15 Units into the skin daily.  03/31/19  Yes [name], MD  ipratropium (ATROVENT) 0.06 % nasal spray Place 2 sprays into both nostrils 4 (four) times daily. 08/21/19  Yes [name], MD  Lancet Devices (ONE TOUCH DELICA LANCING DEV) MISC Use to test blood glucose once daily. Dx code = E11.9 12/16/17  Yes [name], MD  levETIRAcetam (KEPPRA) 750 MG tablet Take 1 tablet (750 mg total) by mouth at bedtime. Patient taking differently: Take 750 mg by mouth at bedtime as needed (restless legs).  08/08/18  Yes Kathrynn Ducking, MD  neomycin-polymyxin-hydrocortisone (CORTISPORIN) OTIC solution Place 3 drops into both ears 4 (four) times daily. For otitis externa 02/15/20  Yes [name], MD  nitroGLYCERIN (NITROSTAT) 0.4 MG SL tablet Place 1 tablet (0.4 mg total) under the tongue every 5 (five) minutes as needed for chest pain. 01/05/20  Yes Fritzi Mandes, MD  NONFORMULARY OR COMPOUNDED [place]  Combination Pain Cream -  Baclofen 2%, Doxepin 5%, Gabapentin 6%, Topiramate 2%, Pentoxifylline 3% Apply 1-2 grams to affected area 3-4 times daily Qty. 120 gm 3 refills 10/14/17  Yes Gardiner Barefoot, DPM  nystatin [facility]) powder Apply 1 g topically 4 (four) times daily as needed (rash).    Yes [provider]  ondansetron (ZOFRAN ODT) 4 MG disintegrating tablet Take 1 tablet (4 mg total) by mouth every 8 (eight) hours as needed for nausea or vomiting. 08/07/19  Yes [name], MD  [facility] VERIO test strip daily. 12/29/19  Yes [provider]  OVER THE COUNTER MEDICATION Apply 1  application topically daily as needed (pain). [place]  Yes [provider]  pregabalin (LYRICA) 100 MG capsule Take 100 mg by mouth at bedtime as needed (leg cramps).    Yes [provider]  Syringe/Needle, Disp, (SYRINGE 3CC/25GX1") 25G X 1" 3 ML MISC Use for b12 injections 09/23/17  Yes [name], MD  traMADol (ULTRAM) 50 MG tablet Take 0.5 tablets (25 mg total) by mouth every 12 (twelve) hours as needed for severe pain. 08/21/17  Yes Regalado, Belkys A, MD  umeclidinium-vilanterol (ANORO ELLIPTA) 62.5-25 MCG/INH AEPB Inhale 1 puff into the lungs daily. 02/24/19  Yes Deneise Lever, MD    Family History  Problem Relation Age of Onset  . Heart disease Father   . Hypertension Father   . Prostate cancer Father   . Stroke Father   . Osteoporosis Father   . Stroke Mother   . Depression Mother   . Headache Mother   . Heart disease Mother   . Thyroid disease Mother   . Hypertension Mother   . Diabetes Daughter   . Heart disease Daughter   . Hypertension Daughter   . Hypertension Son      Social History   Tobacco Use  . Smoking status: Never Smoker  . Smokeless tobacco: Never Used  Vaping Use  . Vaping Use: Never used  Substance Use Topics  . Alcohol use: No  . Drug use: No    Allergies as of 02/23/2020 - Review Complete 02/23/2020  Allergen Reaction Noted  . Demeclocycline Hives 09/18/2013  . Erythromycin Nausea And Vomiting and Other (See Comments) 08/17/2011  . Flagyl [metronidazole] Nausea And Vomiting and Other (See Comments) 08/17/2011  . Glucophage [metformin hcl] Nausea And Vomiting and Other (See Comments) 08/17/2011  . Tetracyclines & related Hives and Rash 03/12/2011  . Diovan [valsartan] Nausea Only 08/17/2011  . Sulfa antibiotics Rash and Other (See Comments) 09/18/2013  . Xanax [alprazolam] Other (See Comments) 08/17/2011    Review of Systems:    All systems reviewed and negative except where noted in HPI.   Physical Exam:  BP  (!) 132/59   Pulse 87   Ht 5' 4.5" (1.638 m)   Wt 252 lb 6.4 oz (114.5 kg)   BMI 42.66 kg/m  No LMP recorded. Patient has had a hysterectomy. General:   Alert,  Well-developed, well-nourished, pleasant and cooperative in NAD Head:  Normocephalic and atraumatic. Eyes:  Sclera clear, no icterus.   Conjunctiva pink. Ears:  Normal auditory acuity. Neck:  Supple; no masses or thyromegaly. Lungs:  Respirations even and unlabored.  Clear throughout to auscultation.   No wheezes, crackles, or rhonchi. No acute distress. Heart:  Regular rate and rhythm; no murmurs, clicks, rubs, or gallops. Abdomen:  Normal bowel sounds.  No bruits.  Soft, non-tender and non-distended without masses, hepatosplenomegaly or hernias noted.  No guarding or rebound tenderness.  Negative Carnett sign.   Rectal:  Deferred.  Pulses:  Normal pulses noted. Extremities:  No clubbing or edema.  No cyanosis. Neurologic:  Alert and oriented x3;  grossly normal neurologically. Skin:  Intact without significant lesions or rashes.  No jaundice. Lymph Nodes:  No significant cervical adenopathy. Psych:  Alert and cooperative. Normal mood and affect.  Imaging Studies: No results found.  Assessment and Plan:   RABIAH GOESER is a 74 y.o. y/o female who comes in today with a history of trouble swallowing.  The patient had a consultation in [place] by another gastroenterologist for the same back in January 2019.  The patient was  on pain medication at that time and it was thought that it may be due to a motility issue due to the pain medications.  The patient now comes in today with a report that she cannot continue going on like this and that she cannot wait for 6 months to stop the Plavix due to her most recent stents being placed.  The patient has been set up for a barium swallow and if this is negative she may need a modified barium swallow to see if there is any issues.  The patient has had a gastric emptying study in the past  that was normal.  The patient has been explained the plan agrees with it.    [name], MD. Marval Regal    Note: This dictation was prepared with Dragon dictation along with smaller phrase technology. Any transcriptional errors that result from this process are unintentional.

## 2020-02-24 ENCOUNTER — Encounter: Payer: Self-pay | Admitting: *Deleted

## 2020-02-24 ENCOUNTER — Encounter: Payer: Medicare Other | Admitting: *Deleted

## 2020-02-24 DIAGNOSIS — Z955 Presence of coronary angioplasty implant and graft: Secondary | ICD-10-CM | POA: Diagnosis not present

## 2020-02-24 DIAGNOSIS — G2581 Restless legs syndrome: Secondary | ICD-10-CM | POA: Diagnosis not present

## 2020-02-24 DIAGNOSIS — F329 Major depressive disorder, single episode, unspecified: Secondary | ICD-10-CM | POA: Diagnosis not present

## 2020-02-24 DIAGNOSIS — G4733 Obstructive sleep apnea (adult) (pediatric): Secondary | ICD-10-CM | POA: Diagnosis not present

## 2020-02-24 DIAGNOSIS — I10 Essential (primary) hypertension: Secondary | ICD-10-CM | POA: Diagnosis not present

## 2020-02-24 DIAGNOSIS — I447 Left bundle-branch block, unspecified: Secondary | ICD-10-CM | POA: Diagnosis not present

## 2020-02-24 NOTE — Progress Notes (Signed)
Cardiac Individual Treatment Plan  Patient Details  Name: Denise Sharp MRN: [id number] Date of Birth: [birth year] Referring Provider:     Cardiac Rehab from 01/25/2020 in [facility] Cardiac and Pulmonary Rehab  Referring Provider Arida (End)      Initial Encounter Date:    Cardiac Rehab from 01/25/2020 in [facility] Cardiac and Pulmonary Rehab  Date 01/25/20      Visit Diagnosis: Status post coronary artery stent placement  Patient's Home Medications on Admission:  Current Outpatient Medications:  .  albuterol (VENTOLIN HFA) 108 (90 Base) MCG/ACT inhaler, Inhale 2 puffs into the lungs every 8 (eight) hours as needed for wheezing or shortness of breath., Disp: 8 g, Rfl: 2 .  aspirin EC 81 MG tablet, Take 1 tablet (81 mg total) by mouth daily., Disp: 30 tablet, Rfl: 2 .  atorvastatin (LIPITOR) 40 MG tablet, Take 1 tablet (40 mg total) by mouth daily., Disp: 90 tablet, Rfl: 0 .  benzonatate (TESSALON) 200 MG capsule, Take 1 capsule (200 mg total) by mouth 2 (two) times daily as needed for cough., Disp: 30 capsule, Rfl: 5 .  blood glucose meter kit and supplies, Use to check sugars up to three times daily. Dx code: E11.9, Disp: 1 each, Rfl: 0 .  Blood Glucose Monitoring Suppl (ONETOUCH VERIO) w/Device KIT, 1 Device by Does not apply route daily. Use to test blood glucose once daily. Dx code = E11.9, Disp: 1 kit, Rfl: 0 .  buPROPion (WELLBUTRIN) 75 MG tablet, TAKE 2 TABLETS BY MOUTH EVERY MORNING AND 1 TABLET IN THE AFTERNOON, Disp: 270 tablet, Rfl: 1 .  butalbital-acetaminophen-caffeine (FIORICET) 50-325-40 MG tablet, Take 1 tablet by mouth every 6 (six) hours as needed for headache., Disp: 90 tablet, Rfl: 5 .  citalopram (CELEXA) 10 MG tablet, Take 1 tablet (10 mg total) by mouth daily., Disp: 90 tablet, Rfl: 1 .  clopidogrel (PLAVIX) 75 MG tablet, Take 1 tablet (75 mg total) by mouth daily., Disp: 30 tablet, Rfl: 10 .  clotrimazole-betamethasone (LOTRISONE) cream, Apply 1 application topically 2  (two) times daily. (Patient taking differently: Apply 1 application topically 2 (two) times daily as needed (rash). ), Disp: 45 g, Rfl: 3 .  Continuous Blood Gluc Receiver (FREESTYLE LIBRE 14 DAY READER) DEVI, 1 applicator by Does not apply route 4 (four) times daily., Disp: 1 each, Rfl: 6 .  Continuous Blood Gluc Sensor (FREESTYLE LIBRE 14 DAY SENSOR) MISC, Use to test blood sugar 4 times daily, Disp: 1 each, Rfl: 6 .  cyanocobalamin (,VITAMIN B-12,) 1000 MCG/ML injection, Inject 1 ml (1000 mcg ) IM weekly x 4,  Then monthly thereafter (Patient taking differently: Inject 1,000 mcg into the muscle every 30 (thirty) days. On the 12th day), Disp: 110 mL, Rfl: 0 .  diazepam (VALIUM) 5 MG tablet, Take 1 tablet (5 mg total) by mouth at bedtime as needed for anxiety., Disp: 30 tablet, Rfl: 5 .  dicyclomine (BENTYL) 10 MG capsule, Take 1 capsule (10 mg total) by mouth 4 (four) times daily -  before meals and at bedtime., Disp: 360 capsule, Rfl: 3 .  diphenhydrAMINE (BENADRYL) 25 MG tablet, Take 50 mg by mouth daily as needed for allergies., Disp: , Rfl:  .  diphenoxylate-atropine (LOMOTIL) 2.5-0.025 MG tablet, Take 1 tablet by mouth 4 (four) times daily as needed for diarrhea or loose stools., Disp: 60 tablet, Rfl: 0 .  DROPLET PEN NEEDLES 31G X 5 MM MISC, USE ONE NEEDLE SUBCUTANEOUSLY AS DIRECTED (REMOVE AND DISCARD NEEDLE IN SHARPS  CONTAINER IMMEDIATELY AFTER USE), Disp: 100 each, Rfl: 1 .  Eszopiclone 3 MG TABS, Take 1 tablet (3 mg total) by mouth at bedtime., Disp: 90 tablet, Rfl: 1 .  hyoscyamine (ANASPAZ) 0.125 MG TBDP disintergrating tablet, DISSOLVE UNDER TONGUE 15 MINUTES BEFORE MEALS, Disp: 60 tablet, Rfl: 0 .  Insulin Glargine (LANTUS SOLOSTAR) 100 UNIT/ML Solostar Pen, Inject 11 Units into the skin daily. (Patient taking differently: Inject 10-15 Units into the skin daily. ), Disp: 5 pen, Rfl: 3 .  ipratropium (ATROVENT) 0.06 % nasal spray, Place 2 sprays into both nostrils 4 (four) times daily.,  Disp: 15 mL, Rfl: 12 .  Lancet Devices (ONE TOUCH DELICA LANCING DEV) MISC, Use to test blood glucose once daily. Dx code = E11.9, Disp: 1 each, Rfl: 0 .  levETIRAcetam (KEPPRA) 750 MG tablet, Take 1 tablet (750 mg total) by mouth at bedtime. (Patient taking differently: Take 750 mg by mouth at bedtime as needed (restless legs). ), Disp: 90 tablet, Rfl: 3 .  neomycin-polymyxin-hydrocortisone (CORTISPORIN) OTIC solution, Place 3 drops into both ears 4 (four) times daily. For otitis externa, Disp: 10 mL, Rfl: 0 .  nitroGLYCERIN (NITROSTAT) 0.4 MG SL tablet, Place 1 tablet (0.4 mg total) under the tongue every 5 (five) minutes as needed for chest pain., Disp: 20 tablet, Rfl: 1 .  NONFORMULARY OR COMPOUNDED ITEM, [facility]  Combination Pain Cream -  Baclofen 2%, Doxepin 5%, Gabapentin 6%, Topiramate 2%, Pentoxifylline 3% Apply 1-2 grams to affected area 3-4 times daily Qty. 120 gm 3 refills, Disp: 1 each, Rfl: 3 .  nystatin (NYAMYC) powder, Apply 1 g topically 4 (four) times daily as needed (rash). , Disp: , Rfl:  .  ondansetron (ZOFRAN ODT) 4 MG disintegrating tablet, Take 1 tablet (4 mg total) by mouth every 8 (eight) hours as needed for nausea or vomiting., Disp: 20 tablet, Rfl: 0 .  ONETOUCH VERIO test strip, daily., Disp: , Rfl:  .  OVER THE COUNTER MEDICATION, Apply 1 application topically daily as needed (pain). [place], Disp: , Rfl:  .  pregabalin (LYRICA) 100 MG capsule, Take 100 mg by mouth at bedtime as needed (leg cramps). , Disp: , Rfl:  .  Syringe/Needle, Disp, (SYRINGE 3CC/25GX1") 25G X 1" 3 ML MISC, Use for b12 injections, Disp: 50 each, Rfl: 0 .  traMADol (ULTRAM) 50 MG tablet, Take 0.5 tablets (25 mg total) by mouth every 12 (twelve) hours as needed for severe pain., Disp: 30 tablet, Rfl: 0 .  umeclidinium-vilanterol (ANORO ELLIPTA) 62.5-25 MCG/INH AEPB, Inhale 1 puff into the lungs daily., Disp: 1 each, Rfl: 12  Past Medical History: Past Medical History:  Diagnosis Date    . Abnormality of gait 03/25/2013  . Adrenal mass, left ([place])   . Anemia    iron deficient post  2 unit txfsn 2009, normal endo/colonoscopy by [facility]  . Arthritis   . Atypical chest pain    a. 04/1986 Cath ([facility]): nl cors, EF 65%; b. 07/2012 Cath [facility]): LM nl, LAD/LCX/RCA w/ min irregs; c. 07/2016 MV [facility]): "Equivocal" w/ small inf wall rev defect and hyperdynamic LV contraction, EF 87%; d. 08/2016 Cardiac CT Ca2+ score [facility]): Ca2+ score 1548; e. 05/2019 MV: No ischemia. Diaph atten. EF 75%.  . Cervical spinal stenosis 1994   due to trauma to back ([employer] accident), has intermittent paralysis and parasthesias  . Cervicogenic headache 03/23/2014  . Depression   . Diastolic dysfunction    a. 12/2019 Echo: EF 65-70%, Gr2 DD. No significant  valvular dzs.  . Dizziness    chronic dizziness  . DJD (degenerative joint disease)    a. Chronic R shoulder pain pending R shoulder replacement 07/2017.  [name] Esophageal stenosis March 2011   with transietn outlet obstruction by food, cleared by EGD   . Family history of adverse reaction to anesthesia    daughter PONV  . Gastric bypass status for obesity   . Headache(784.0)   . Hypertension   . IBS (irritable bowel syndrome)   . Left bundle branch block (LBBB)    a. Intermittently present - likely rate related. - patient denies  . Obesity   . Obstructive sleep apnea    using CPAP  . 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 Restless leg syndrome   . Rotator cuff arthropathy, right 08/13/2017  . Syncope and collapse 03/12/2014  . Type II diabetes mellitus (HCC)     Tobacco Use: Social History   Tobacco Use  Smoking Status Never Smoker  Smokeless Tobacco Never Used    Labs: Recent Review Flowsheet Data    Labs for ITP Cardiac and Pulmonary Rehab Latest Ref Rng & Units 08/14/2018 03/06/2019 06/09/2019 12/31/2019 01/25/2020   Cholestrol 0 - 200 mg/dL - - - 183 -   LDLCALC 0 - 99 mg/dL - - - 113(H) -   LDLDIRECT mg/dL - - - - -   HDL >40 mg/dL - -  - 42 -   Trlycerides <150 mg/dL - - - 140 -   Hemoglobin A1c 4.8 - 5.6 % 6.5 7.4(H) 7.2(H) 7.7(H) 7.6(H)   TCO2 22 - 32 mmol/L - - - - -       Exercise Target Goals: Exercise Program Goal: Individual exercise prescription set using results from initial 6 min walk test and THRR while considering  patient's activity barriers and safety.   Exercise Prescription Goal: Initial exercise prescription builds to 30-45 minutes a day of aerobic activity, 2-3 days per week.  Home exercise guidelines will be given to patient during program as part of exercise prescription that the participant will acknowledge.   Education: Aerobic Exercise & Resistance Training: - Gives group verbal and written instruction on the various components of exercise. Focuses on aerobic and resistive training programs and the benefits of this training and how to safely progress through these programs..   Education: Exercise & Equipment Safety: - Individual verbal instruction and demonstration of equipment use and safety with use of the equipment.   Cardiac Rehab from 02/17/2020 in [facility] Cardiac and Pulmonary Rehab  Date 01/25/20  Educator AS  Instruction Review Code 1- Verbalizes Understanding      Education: Exercise Physiology & General Exercise Guidelines: - Group verbal and written instruction with models to review the exercise physiology of the cardiovascular system and associated critical values. Provides general exercise guidelines with specific guidelines to those with heart or lung disease.    Cardiac Rehab from 02/17/2020 in [facility] Cardiac and Pulmonary Rehab  Date 01/27/20  Educator AS  Instruction Review Code 1- Verbalizes Understanding      Education: Flexibility, Balance, Mind/Body Relaxation: Provides group verbal/written instruction on the benefits of flexibility and balance training, including mind/body exercise modes such as yoga, pilates and tai chi.  Demonstration and skill practice  provided.   Activity Barriers & Risk Stratification:  Activity Barriers & Cardiac Risk Stratification - 01/15/20 1411      Activity Barriers & Cardiac Risk Stratification   Activity Barriers Arthritis;Back Problems;Muscular Weakness;Neck/Spine Problems;Left Knee Replacement;Right Knee Replacement;Balance Concerns;Assistive Device;Joint  Problems    Cardiac Risk Stratification High           6 Minute Walk:  6 Minute Walk    Row Name 01/25/20 1438         6 Minute Walk   Phase Initial     Distance 400 feet     Walk Time 5.5 minutes     # of Rest Breaks 0     MPH 0.75     METS 0.5     RPE 15     Perceived Dyspnea  2     VO2 Peak 1.81     Symptoms Yes (comment)     Comments R hip/buttock 3/10     Resting HR 73 bpm     Resting BP 108/68     Resting Oxygen Saturation  98 %     Exercise Oxygen Saturation  during 6 min walk 97 %     Max Ex. HR 102 bpm     Max Ex. BP 146/60     2 Minute Post BP 130/68            Oxygen Initial Assessment:   Oxygen Re-Evaluation:   Oxygen Discharge (Final Oxygen Re-Evaluation):   Initial Exercise Prescription:  Initial Exercise Prescription - 01/25/20 1400      Date of Initial Exercise RX and Referring Provider   Date 01/25/20    Referring Provider Arida (End)      Recumbant Bike   Level 1    RPM 60    Minutes 15      NuStep   Level 1    SPM 80    Minutes 15      Arm Ergometer   Level 1    RPM 25    Minutes 15      Prescription Details   Frequency (times per week) 3    Duration Progress to 30 minutes of continuous aerobic without signs/symptoms of physical distress      Intensity   THRR 40-80% of Max Heartrate 102-131    Ratings of Perceived Exertion 11-15    Perceived Dyspnea 0-4      Resistance Training   Training Prescription Yes    Weight 3 lb    Reps 10-15           Perform Capillary Blood Glucose checks as needed.  Exercise Prescription Changes:  Exercise Prescription Changes    Row Name  01/25/20 1400 02/09/20 1500 02/23/20 1500         Response to Exercise   Blood Pressure (Admit) 108/68 122/70 126/70     Blood Pressure (Exercise) 146/66 130/68 132/68     Blood Pressure (Exit) 130/68 122/60 118/70     Heart Rate (Admit) 73 bpm 92 bpm 88 bpm     Heart Rate (Exercise) 102 bpm 99 bpm 126 bpm     Heart Rate (Exit) 74 bpm 90 bpm 86 bpm     Oxygen Saturation (Admit) 98 % -- --     Oxygen Saturation (Exercise) 97 % -- --     Rating of Perceived Exertion (Exercise) _0 Perceived Dyspnea (Exercise) 2 -- --     Symptoms R hip buttock 3/10 unable to do bike with hip --     Duration -- Progress to 30 minutes of  aerobic without signs/symptoms of physical distress Progress to 30 minutes of  aerobic without signs/symptoms of physical distress     Intensity -- THRR  unchanged THRR unchanged       Progression   Progression -- Continue to progress workloads to maintain intensity without signs/symptoms of physical distress. Continue to progress workloads to maintain intensity without signs/symptoms of physical distress.     Average METs -- 1.8 2.5       Resistance Training   Training Prescription -- Yes Yes     Weight -- 3 lb 3 lb     Reps -- 10-15 10-15       Interval Training   Interval Training -- No No       Recumbant Bike   Level -- -- 1     RPM -- -- 60     Minutes -- -- 15       NuStep   Level -- 1 4     SPM -- -- 80     Minutes -- 15 15     METs -- 1.8 2.2       Arm Ergometer   Level -- 1 --     Minutes -- 15 --     METs -- 1.8 --            Exercise Comments:   Exercise Goals and Review:  Exercise Goals    Row Name 01/25/20 1454             Exercise Goals   Increase Physical Activity Yes       Intervention Provide advice, education, support and counseling about physical activity/exercise needs.;Develop an individualized exercise prescription for aerobic and resistive training based on initial evaluation findings, risk stratification,  comorbidities and participant's personal goals.       Expected Outcomes Short Term: Attend rehab on a regular basis to increase amount of physical activity.;Long Term: Add in home exercise to make exercise part of routine and to increase amount of physical activity.;Long Term: Exercising regularly at least 3-5 days a week.       Increase Strength and Stamina Yes       Intervention Provide advice, education, support and counseling about physical activity/exercise needs.;Develop an individualized exercise prescription for aerobic and resistive training based on initial evaluation findings, risk stratification, comorbidities and participant's personal goals.       Expected Outcomes Short Term: Increase workloads from initial exercise prescription for resistance, speed, and METs.;Short Term: Perform resistance training exercises routinely during rehab and add in resistance training at home;Long Term: Improve cardiorespiratory fitness, muscular endurance and strength as measured by increased METs and functional capacity (6MWT)       Able to understand and use rate of perceived exertion (RPE) scale Yes       Intervention Provide education and explanation on how to use RPE scale       Expected Outcomes Short Term: Able to use RPE daily in rehab to express subjective intensity level;Long Term:  Able to use RPE to guide intensity level when exercising independently       Knowledge and understanding of Target Heart Rate Range (THRR) Yes       Intervention Provide education and explanation of THRR including how the numbers were predicted and where they are located for reference       Expected Outcomes Short Term: Able to state/look up THRR;Short Term: Able to use daily as guideline for intensity in rehab;Long Term: Able to use THRR to govern intensity when exercising independently       Able to check pulse independently Yes       Intervention Provide  education and demonstration on how to check pulse in carotid and  radial arteries.;Review the importance of being able to check your own pulse for safety during independent exercise       Expected Outcomes Short Term: Able to explain why pulse checking is important during independent exercise;Long Term: Able to check pulse independently and accurately       Understanding of Exercise Prescription Yes       Intervention Provide education, explanation, and written materials on patient's individual exercise prescription       Expected Outcomes Short Term: Able to explain program exercise prescription;Long Term: Able to explain home exercise prescription to exercise independently              Exercise Goals Re-Evaluation :  Exercise Goals Re-Evaluation    Row Name 01/27/20 1421 02/09/20 1511 02/23/20 1522         Exercise Goal Re-Evaluation   Exercise Goals Review Increase Physical Activity;Able to understand and use rate of perceived exertion (RPE) scale;Knowledge and understanding of Target Heart Rate Range (THRR);Understanding of Exercise Prescription;Increase Strength and Stamina;Able to check pulse independently Increase Physical Activity;Increase Strength and Stamina;Understanding of Exercise Prescription Increase Physical Activity;Increase Strength and Stamina;Understanding of Exercise Prescription     Comments Reviewed RPE and dyspnea scales, THR and program prescription with pt today.  Pt voiced understanding and was given a copy of goals to take home. Benelli is off to a good start in rehab.  She is now up to 1.8 METs on the arm crank.  She was unable to do the bike, so we have rearranged her equipment to what she can use.  We will continue to monitor her progress. Lorraine tolerates exercise well and is able to work in the correct THR range.  Staff will monitor progress.     Expected Outcomes Short: Use RPE daily to regulate intensity. Long: Follow program prescription in THR. Short: Continue to attend regularly Long: Continue to improve stamina. Short:  Continue to attend regularly Long: Continue to improve stamina.            Discharge Exercise Prescription (Final Exercise Prescription Changes):  Exercise Prescription Changes - 02/23/20 1500      Response to Exercise   Blood Pressure (Admit) 126/70    Blood Pressure (Exercise) 132/68    Blood Pressure (Exit) 118/70    Heart Rate (Admit) 88 bpm    Heart Rate (Exercise) 126 bpm    Heart Rate (Exit) 86 bpm    Rating of Perceived Exertion (Exercise) 17    Duration Progress to 30 minutes of  aerobic without signs/symptoms of physical distress    Intensity THRR unchanged      Progression   Progression Continue to progress workloads to maintain intensity without signs/symptoms of physical distress.    Average METs 2.5      Resistance Training   Training Prescription Yes    Weight 3 lb    Reps 10-15      Interval Training   Interval Training No      Recumbant Bike   Level 1    RPM 60    Minutes 15      NuStep   Level 4    SPM 80    Minutes 15    METs 2.2           Nutrition:  Target Goals: Understanding of nutrition guidelines, daily intake of sodium <1580m, cholesterol <2031m calories 30% from fat and 7% or less from saturated fats,  daily to have 5 or more servings of fruits and vegetables.  Education: Controlling Sodium/Reading Food Labels -Group verbal and written material supporting the discussion of sodium use in heart healthy nutrition. Review and explanation with models, verbal and written materials for utilization of the food label.   Education: General Nutrition Guidelines/Fats and Fiber: -Group instruction provided by verbal, written material, models and posters to present the general guidelines for heart healthy nutrition. Gives an explanation and review of dietary fats and fiber.   Biometrics:  Pre Biometrics - 01/25/20 1455      Pre Biometrics   Height 5' 5" (1.651 m)    Weight 241 lb 3.2 oz (109.4 kg)    BMI (Calculated) 40.14             Nutrition Therapy Plan and Nutrition Goals:   Nutrition Assessments:  Nutrition Assessments - 01/25/20 1500      Rate Your Plate Scores   Pre Score 41           MEDIFICTS Score Key:          ?70 Need to make dietary changes          40-70 Heart Healthy Diet         ? 40 Therapeutic Level Cholesterol Diet  Nutrition Goals Re-Evaluation:   Nutrition Goals Discharge (Final Nutrition Goals Re-Evaluation):   Psychosocial: Target Goals: Acknowledge presence or absence of significant depression and/or stress, maximize coping skills, provide positive support system. Participant is able to verbalize types and ability to use techniques and skills needed for reducing stress and depression.   Education: Depression - Provides group verbal and written instruction on the correlation between heart/lung disease and depressed mood, treatment options, and the stigmas associated with seeking treatment.   Cardiac Rehab from 02/17/2020 in [facility] Cardiac and Pulmonary Rehab  Date 02/17/20  Educator SB  Instruction Review Code 1- [employer] Understanding      Education: Sleep Hygiene -Provides group verbal and written instruction about how sleep can affect your health.  Define sleep hygiene, discuss sleep cycles and impact of sleep habits. Review good sleep hygiene tips.     Education: Stress and Anxiety: - Provides group verbal and written instruction about the health risks of elevated stress and causes of high stress.  Discuss the correlation between heart/lung disease and anxiety and treatment options. Review healthy ways to manage with stress and anxiety.   Cardiac Rehab from 02/17/2020 in [facility] Cardiac and Pulmonary Rehab  Date 02/17/20  Educator SB  Instruction Review Code 1- Verbalizes Understanding       Initial Review & Psychosocial Screening:  Initial Psych Review & Screening - 01/15/20 1414      Initial Review   Current issues with Current Stress Concerns    Source of  Stress Concerns Chronic Illness      Family Dynamics   Good Support System? Yes   husband, daughter     Barriers   Psychosocial barriers to participate in program There are no identifiable barriers or psychosocial needs.;The patient should benefit from training in stress management and relaxation.      Screening Interventions   Interventions Encouraged to exercise;To provide support and resources with identified psychosocial needs;Provide feedback about the scores to participant    Expected Outcomes Short Term goal: Utilizing psychosocial counselor, staff and physician to assist with identification of specific Stressors or current issues interfering with healing process. Setting desired goal for each stressor or current issue identified.;Long Term Goal: Stressors or  current issues are controlled or eliminated.;Short Term goal: Identification and review with participant of any Quality of Life or Depression concerns found by scoring the questionnaire.;Long Term goal: The participant improves quality of Life and PHQ9 Scores as seen by post scores and/or verbalization of changes           Quality of Life Scores:   Quality of Life - 01/25/20 1500      Quality of Life   Select Quality of Life      Quality of Life Scores   Health/Function Pre 14.47 %    Socioeconomic Pre 18.25 %    Psych/Spiritual Pre 23.43 %    Family Pre 26.4 %    GLOBAL Pre 18.86 %          Scores of 19 and below usually indicate a poorer quality of life in these areas.  A difference of  2-3 points is a clinically meaningful difference.  A difference of 2-3 points in the total score of the Quality of Life Index has been associated with significant improvement in overall quality of life, self-image, physical symptoms, and general health in studies assessing change in quality of life.  PHQ-9: Recent Review Flowsheet Data    Depression screen [facility] 2/9 02/17/2020 01/25/2020 01/13/2020 06/24/2019 05/28/2019   Decreased Interest  1 0 0 0 0   Down, Depressed, Hopeless 0 0 0 0 0   PHQ - 2 Score 1 0 0 0 0   Altered sleeping 2 2  - - -   Tired, decreased energy 3 3  - - -   Change in appetite 2 0 - - -   Feeling bad or failure about yourself  0 0 - - -   Trouble concentrating 2 0 - - -   Moving slowly or fidgety/restless 0 3 - - -   Suicidal thoughts 0 0 - - -   PHQ-9 Score 10 8 - - -   Difficult doing work/chores Somewhat difficult Extremely dIfficult - - -     Interpretation of Total Score  Total Score Depression Severity:  1-4 = Minimal depression, 5-9 = Mild depression, 10-14 = Moderate depression, 15-19 = Moderately severe depression, 20-27 = Severe depression   Psychosocial Evaluation and Intervention:  Psychosocial Evaluation - 01/15/20 1415      Psychosocial Evaluation & Interventions   Interventions Encouraged to exercise with the program and follow exercise prescription    Comments Dequita has a long history of health issues. She is curious to see what she can do during cardiac rehab given her history of mobility issues. She wants to improve her stamina and balance. She reports a good support system of her husband and her daughter.    Expected Outcomes Short: attend cardiac rehab for exercise and education. Long: develop positive self care habits.    Continue Psychosocial Services  Follow up required by staff           Psychosocial Re-Evaluation:  Psychosocial Re-Evaluation    [place] Name 02/17/20 1509             Psychosocial Re-Evaluation   Current issues with Current Sleep Concerns;Current Stress Concerns       Comments Erma states she feels a little bit better since being able to get out and about. She still struggles with chronic insomnia. Her PHQ was reviewed by staff and will be repeated as it is still high.       Expected Outcomes Short: attend cardiac rehab for educaion,  exercise and something to do out of the house. Long: find a positive self care habit.       Continue Psychosocial  Services  Follow up required by staff              Psychosocial Discharge (Final Psychosocial Re-Evaluation):  Psychosocial Re-Evaluation - 02/17/20 1509      Psychosocial Re-Evaluation   Current issues with Current Sleep Concerns;Current Stress Concerns    Comments Christie states she feels a little bit better since being able to get out and about. She still struggles with chronic insomnia. Her PHQ was reviewed by staff and will be repeated as it is still high.    Expected Outcomes Short: attend cardiac rehab for educaion, exercise and something to do out of the house. Long: find a positive self care habit.    Continue Psychosocial Services  Follow up required by staff           Vocational Rehabilitation: Provide vocational rehab assistance to qualifying candidates.   Vocational Rehab Evaluation & Intervention:  Vocational Rehab - 01/15/20 1414      Initial Vocational Rehab Evaluation & Intervention   Assessment shows need for Vocational Rehabilitation No           Education: Education Goals: Education classes will be provided on a variety of topics geared toward better understanding of heart health and risk factor modification. Participant will state understanding/return demonstration of topics presented as noted by education test scores.  Learning Barriers/Preferences:  Learning Barriers/Preferences - 01/15/20 1414      Learning Barriers/Preferences   Learning Barriers None    Learning Preferences None           General Cardiac Education Topics:  AED/CPR: - Group verbal and written instruction with the use of models to demonstrate the basic use of the AED with the basic ABC's of resuscitation.   Anatomy & Physiology of the Heart: - Group verbal and written instruction and models provide basic cardiac anatomy and physiology, with the coronary electrical and arterial systems. Review of Valvular disease and Heart Failure   Cardiac Procedures: - Group verbal and  written instruction to review commonly prescribed medications for heart disease. Reviews the medication, class of the drug, and side effects. Includes the steps to properly store meds and maintain the prescription regimen. (beta blockers and nitrates)   Cardiac Medications I: - Group verbal and written instruction to review commonly prescribed medications for heart disease. Reviews the medication, class of the drug, and side effects. Includes the steps to properly store meds and maintain the prescription regimen.   Cardiac Medications II: -Group verbal and written instruction to review commonly prescribed medications for heart disease. Reviews the medication, class of the drug, and side effects. (all other drug classes)    Go Sex-Intimacy & Heart Disease, Get SMART - Goal Setting: - Group verbal and written instruction through game format to discuss heart disease and the return to sexual intimacy. Provides group verbal and written material to discuss and apply goal setting through the application of the S.M.A.R.T. Method.   Other Matters of the Heart: - Provides group verbal, written materials and models to describe Stable Angina and Peripheral Artery. Includes description of the disease process and treatment options available to the cardiac patient.   Infection Prevention: - Provides verbal and written material to individual with discussion of infection control including proper hand washing and proper equipment cleaning during exercise session.   Cardiac Rehab from 02/17/2020 in [facility] Cardiac and  Pulmonary Rehab  Date 01/25/20  Educator AS  Instruction Review Code 1- Verbalizes Understanding      Falls Prevention: - Provides verbal and written material to individual with discussion of falls prevention and safety.   Cardiac Rehab from 02/17/2020 in [facility] Cardiac and Pulmonary Rehab  Date 01/25/20  Educator AS  Instruction Review Code 1- Verbalizes Understanding      Other: -Provides  group and verbal instruction on various topics (see comments)   Knowledge Questionnaire Score:  Knowledge Questionnaire Score - 01/25/20 1500      Knowledge Questionnaire Score   Pre Score 21/26           Core Components/Risk Factors/Patient Goals at Admission:  Personal Goals and Risk Factors at Admission - 01/25/20 1504      Core Components/Risk Factors/Patient Goals on Admission    Weight Management Yes    Intervention Weight Management: Develop a combined nutrition and exercise program designed to reach desired caloric intake, while maintaining appropriate intake of nutrient and fiber, sodium and fats, and appropriate energy expenditure required for the weight goal.;Weight Management: Provide education and appropriate resources to help participant work on and attain dietary goals.;Weight Management/Obesity: Establish reasonable short term and long term weight goals.    Admit Weight 241 lb 3.2 oz (109.4 kg)    Goal Weight: Short Term 235 lb (106.6 kg)    Goal Weight: Long Term 220 lb (99.8 kg)    Expected Outcomes Short Term: Continue to assess and modify interventions until short term weight is achieved;Long Term: Adherence to nutrition and physical activity/exercise program aimed toward attainment of established weight goal;Weight Loss: Understanding of general recommendations for a balanced deficit meal plan, which promotes 1-2 lb weight loss per week and includes a negative energy balance of [phone] kcal/d;Understanding recommendations for meals to include 15-35% energy as protein, 25-35% energy from fat, 35-60% energy from carbohydrates, less than 278m of dietary cholesterol, 20-35 gm of total fiber daily;Understanding of distribution of calorie intake throughout the day with the consumption of 4-5 meals/snacks    Intervention Provide education about signs/symptoms and action to take for hypo/hyperglycemia.;Provide education about proper nutrition, including hydration, and  aerobic/resistive exercise prescription along with prescribed medications to achieve blood glucose in normal ranges: Fasting glucose 65-99 mg/dL    Expected Outcomes Short Term: Participant verbalizes understanding of the signs/symptoms and immediate care of hyper/hypoglycemia, proper foot care and importance of medication, aerobic/resistive exercise and nutrition plan for blood glucose control.;Long Term: Attainment of HbA1C < 7%.    Intervention Provide education on lifestyle modifcations including regular physical activity/exercise, weight management, moderate sodium restriction and increased consumption of fresh fruit, vegetables, and low fat dairy, alcohol moderation, and smoking cessation.;Monitor prescription use compliance.    Expected Outcomes Short Term: Continued assessment and intervention until BP is < 140/997mHG in hypertensive participants. < 130/803mG in hypertensive participants with diabetes, heart failure or chronic kidney disease.;Long Term: Maintenance of blood pressure at goal levels.           Education:Diabetes - Individual verbal and written instruction to review signs/symptoms of diabetes, desired ranges of glucose level fasting, after meals and with exercise. Acknowledge that pre and post exercise glucose checks will be done for 3 sessions at entry of program.   [facility]  Date 01/15/20  [facility] Code 1- VerUnited States Steel Corporationderstanding      Education: Know Your Numbers and Risk Factors: -Group verbal and  written instruction about important numbers in your health.  Discussion of what are risk factors and how they play a role in the disease process.  Review of Cholesterol, Blood Pressure, Diabetes, and BMI and the role they play in your overall health.   Core Components/Risk Factors/Patient Goals Review:   Goals and Risk Factor Review    Row Name 02/17/20 1454             Core  Components/Risk Factors/Patient Goals Review   Personal Goals Review Diabetes;Weight Management/Obesity;Hypertension       Review Education given on new glucose monitor. Diabetic safety reviewed with patient.       Expected Outcomes Short: keep log of blood sugar. Long: maintain independence in management of diabetes              Core Components/Risk Factors/Patient Goals at Discharge (Final Review):   Goals and Risk Factor Review - 02/17/20 1454      Core Components/Risk Factors/Patient Goals Review   Personal Goals Review Diabetes;Weight Management/Obesity;Hypertension    Review Education given on new glucose monitor. Diabetic safety reviewed with patient.    Expected Outcomes Short: keep log of blood sugar. Long: maintain independence in management of diabetes           ITP Comments:  ITP Comments    Row Name 01/15/20 1430 01/25/20 1507 01/27/20 1054 01/27/20 1419 02/24/20 0641   ITP Comments Initial telephone orientation completed. Diagnosis can be found in [facility] 6/16. EP orientation scheduled for 7/12 at 1pm Completed 6MWT and gym orientation. Initial ITP created and sent for review to Dr. Emily Filbert, Medical Director. 30 Day review completed. Medical Director ITP review done, changes made as directed, and signed approval by Medical Director. First full day of exercise!  Patient was oriented to gym and equipment including functions, settings, policies, and procedures.  Patient's individual exercise prescription and treatment plan were reviewed.  All starting workloads were established based on the results of the 6 minute walk test done at initial orientation visit.  The plan for exercise progression was also introduced and progression will be customized based on patient's performance and goals. 30 Day review completed. Medical Director ITP review done, changes made as directed, and signed approval by Medical Director.          Comments:

## 2020-02-24 NOTE — Progress Notes (Signed)
Daily Session Note  Patient Details  Name: ROCHELLA BENNER MRN: [id number] Date of Birth: April 18, 1946 Referring Provider:     Cardiac Rehab from 01/25/2020 in [facility] Cardiac and Pulmonary Rehab  Referring Provider Arida (End)      Encounter Date: 02/24/2020  Check In:  Session Check In - 02/24/20 1401      Check-In   Supervising physician immediately available to respond to emergencies See telemetry face sheet for immediately available ER MD    Location [facility]-Cardiac & Pulmonary Rehab    Staff Present Renita Papa, RN BSN;Joseph Lou Miner, [place] Exercise Physiologist    Virtual Visit No    Medication changes reported     No    Fall or balance concerns reported    No    Warm-up and Cool-down Performed on first and last piece of equipment    Resistance Training Performed Yes    VAD Patient? No    PAD/SET Patient? No      Pain Assessment   Currently in Pain? No/denies              Social History   Tobacco Use  Smoking Status Never Smoker  Smokeless Tobacco Never Used    Goals Met:  Independence with exercise equipment Exercise tolerated well No report of cardiac concerns or symptoms Strength training completed today  Goals Unmet:  Not Applicable  Comments: Pt able to follow exercise prescription today without complaint.  Will continue to monitor for progression.    Dr. Emily Filbert is Medical Director for [place] and [facility].

## 2020-02-25 ENCOUNTER — Other Ambulatory Visit: Payer: Self-pay

## 2020-02-25 ENCOUNTER — Other Ambulatory Visit: Payer: Self-pay | Admitting: Gastroenterology

## 2020-02-25 ENCOUNTER — Ambulatory Visit
Admission: RE | Admit: 2020-02-25 | Discharge: 2020-02-25 | Disposition: A | Discharge status: Home / Self Care | Payer: Medicare Other | Source: Ambulatory Visit | Attending: Gastroenterology | Admitting: Gastroenterology

## 2020-02-25 ENCOUNTER — Other Ambulatory Visit: Payer: Self-pay | Admitting: Internal Medicine

## 2020-02-25 DIAGNOSIS — R1310 Dysphagia, unspecified: Secondary | ICD-10-CM | POA: Diagnosis not present

## 2020-02-25 DIAGNOSIS — R1319 Other dysphagia: Secondary | ICD-10-CM

## 2020-02-25 DIAGNOSIS — K222 Esophageal obstruction: Secondary | ICD-10-CM | POA: Diagnosis not present

## 2020-02-26 ENCOUNTER — Telehealth: Payer: Self-pay

## 2020-02-26 NOTE — Telephone Encounter (Signed)
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  The issue remains of high risk of bleeding on anticoagulation.  She will need to talk to her cardiologist about when she can stop her anticoagulation because we cannot dilate her on anticoagulation due to the certainty that she will bleed from the dilation.

## 2020-02-26 NOTE — Telephone Encounter (Signed)
Pt notified of barium swallow results.  

## 2020-02-29 ENCOUNTER — Encounter: Payer: Medicare Other | Admitting: *Deleted

## 2020-02-29 ENCOUNTER — Other Ambulatory Visit: Payer: Self-pay

## 2020-02-29 DIAGNOSIS — G4733 Obstructive sleep apnea (adult) (pediatric): Secondary | ICD-10-CM | POA: Diagnosis not present

## 2020-02-29 DIAGNOSIS — G2581 Restless legs syndrome: Secondary | ICD-10-CM | POA: Diagnosis not present

## 2020-02-29 DIAGNOSIS — Z955 Presence of coronary angioplasty implant and graft: Secondary | ICD-10-CM | POA: Diagnosis not present

## 2020-02-29 DIAGNOSIS — F329 Major depressive disorder, single episode, unspecified: Secondary | ICD-10-CM | POA: Diagnosis not present

## 2020-02-29 DIAGNOSIS — I10 Essential (primary) hypertension: Secondary | ICD-10-CM | POA: Diagnosis not present

## 2020-02-29 DIAGNOSIS — I447 Left bundle-branch block, unspecified: Secondary | ICD-10-CM | POA: Diagnosis not present

## 2020-02-29 NOTE — Progress Notes (Signed)
Daily Session Note  Patient Details  Name: Denise Sharp MRN: [id number] Date of Birth: 05/20/1946 Referring Provider:     Cardiac Rehab from 01/25/2020 in [facility] Cardiac and Pulmonary Rehab  Referring Provider Arida (End)      Encounter Date: 02/29/2020  Check In:  Session Check In - 02/29/20 1434      Check-In   Supervising physician immediately available to respond to emergencies See telemetry face sheet for immediately available ER MD    Location [facility]-Cardiac & Pulmonary Rehab    Staff Present Heath Lark, RN, BSN, Jacklynn Bue, MS Exercise Physiologist;Kelly Amedeo Plenty, BS, ACSM CEP, Exercise Physiologist    Virtual Visit No    Medication changes reported     No    Fall or balance concerns reported    No    Warm-up and Cool-down Performed on first and last piece of equipment    Resistance Training Performed Yes    VAD Patient? No    PAD/SET Patient? No      Pain Assessment   Currently in Pain? No/denies              Social History   Tobacco Use  Smoking Status Never Smoker  Smokeless Tobacco Never Used    Goals Met:  Independence with exercise equipment Exercise tolerated well No report of cardiac concerns or symptoms  Goals Unmet:  Not Applicable  Comments: Pt able to follow exercise prescription today without complaint.  Will continue to monitor for progression.    Dr. Emily Filbert is Medical Director for [place] and [facility].

## 2020-03-01 DIAGNOSIS — Z23 Encounter for immunization: Secondary | ICD-10-CM | POA: Diagnosis not present

## 2020-03-03 ENCOUNTER — Encounter: Payer: Self-pay | Admitting: Podiatry

## 2020-03-03 ENCOUNTER — Ambulatory Visit (INDEPENDENT_AMBULATORY_CARE_PROVIDER_SITE_OTHER): Payer: Medicare Other | Admitting: Podiatry

## 2020-03-03 ENCOUNTER — Other Ambulatory Visit: Payer: Self-pay

## 2020-03-03 DIAGNOSIS — M79676 Pain in unspecified toe(s): Secondary | ICD-10-CM | POA: Diagnosis not present

## 2020-03-03 DIAGNOSIS — E11610 Type 2 diabetes mellitus with diabetic neuropathic arthropathy: Secondary | ICD-10-CM | POA: Diagnosis not present

## 2020-03-03 DIAGNOSIS — E1149 Type 2 diabetes mellitus with other diabetic neurological complication: Secondary | ICD-10-CM | POA: Diagnosis not present

## 2020-03-03 DIAGNOSIS — B351 Tinea unguium: Secondary | ICD-10-CM

## 2020-03-03 NOTE — Progress Notes (Signed)
This patient returns to my office for at risk foot care.  This patient requires this care by a professional since this patient will be at risk due to having diabetes and charcot feet.  This patient is unable to cut nails herself since the patient cannot reach her nails.These nails are painful walking and wearing shoes.  This patient presents for at risk foot care today.  General Appearance  Alert, conversant and in no acute stress.  Vascular  Dorsalis pedis and posterior tibial  pulses are palpable  bilaterally.  Capillary return is within normal limits  bilaterally. Temperature is within normal limits  bilaterally.  Neurologic  Senn-Weinstein monofilament wire test absent   bilaterally. Muscle power within normal limits bilaterally.  Nails Thick disfigured discolored nails with subungual debris  from hallux to fifth toes bilaterally. No evidence of bacterial infection or drainage bilaterally.  Orthopedic  No limitations of motion  feet .  No crepitus or effusions noted.  No bony pathology or digital deformities noted. Charcot foot.  Severe rearfoot arthritis.  Skin  normotropic skin with no porokeratosis noted bilaterally.  No signs of infections or ulcers noted.     Onychomycosis  Pain in right toes  Pain in left toes  Consent was obtained for treatment procedures.   Mechanical debridement of nails 1-5  bilaterally performed with a nail nipper.  Filed with dremel without incident.    Return office visit    3 months                  Told patient to return for periodic foot care and evaluation due to potential at risk complications.   Gardiner Barefoot DPM

## 2020-03-07 ENCOUNTER — Encounter: Payer: Medicare Other | Admitting: *Deleted

## 2020-03-07 ENCOUNTER — Telehealth: Payer: Self-pay | Admitting: Internal Medicine

## 2020-03-07 ENCOUNTER — Other Ambulatory Visit: Payer: Self-pay

## 2020-03-07 DIAGNOSIS — G4733 Obstructive sleep apnea (adult) (pediatric): Secondary | ICD-10-CM | POA: Diagnosis not present

## 2020-03-07 DIAGNOSIS — I447 Left bundle-branch block, unspecified: Secondary | ICD-10-CM | POA: Diagnosis not present

## 2020-03-07 DIAGNOSIS — G2581 Restless legs syndrome: Secondary | ICD-10-CM | POA: Diagnosis not present

## 2020-03-07 DIAGNOSIS — I10 Essential (primary) hypertension: Secondary | ICD-10-CM | POA: Diagnosis not present

## 2020-03-07 DIAGNOSIS — Z955 Presence of coronary angioplasty implant and graft: Secondary | ICD-10-CM

## 2020-03-07 DIAGNOSIS — F329 Major depressive disorder, single episode, unspecified: Secondary | ICD-10-CM | POA: Diagnosis not present

## 2020-03-07 MED ORDER — ATORVASTATIN CALCIUM 40 MG PO TABS: 40.0000 mg | ORAL_TABLET | Freq: Every day | ORAL | 0 refills | Status: DC

## 2020-03-07 MED ORDER — CLOPIDOGREL BISULFATE 75 MG PO TABS: 75.0000 mg | ORAL_TABLET | Freq: Every day | ORAL | 0 refills | Status: DC

## 2020-03-07 NOTE — Progress Notes (Signed)
Daily Session Note  Patient Details  Name: Denise Sharp MRN: [id number] Date of Birth: [birth year] Referring Provider:     Cardiac Rehab from 01/25/2020 in [facility] Cardiac and Pulmonary Rehab  Referring Provider Arida (End)      Encounter Date: 03/07/2020  Check In:  Session Check In - 03/07/20 1414      Check-In   Supervising physician immediately available to respond to emergencies See telemetry face sheet for immediately available ER MD    Location [facility]-Cardiac & Pulmonary Rehab    Staff Present Heath Lark, RN, BSN, CCRP;Jessica [place], MA, RCEP, CCRP, [place], BS, ACSM CEP, Exercise Physiologist    Virtual Visit No    Medication changes reported     No    Fall or balance concerns reported    No    Warm-up and Cool-down Performed on first and last piece of equipment    Resistance Training Performed Yes    VAD Patient? No    PAD/SET Patient? No      Pain Assessment   Currently in Pain? No/denies              Social History   Tobacco Use  Smoking Status Never Smoker  Smokeless Tobacco Never Used    Goals Met:  Independence with exercise equipment Exercise tolerated well No report of cardiac concerns or symptoms  Goals Unmet:  Not Applicable  Comments: Pt able to follow exercise prescription today without complaint.  Will continue to monitor for progression.    Dr. Emily Filbert is Medical Director for [place] and [facility].

## 2020-03-07 NOTE — Telephone Encounter (Signed)
Pt called and wanted a call back about her blood sugar readings She couldn't give me exact numbers just said they vary from in the 100's to 300"s

## 2020-03-07 NOTE — Telephone Encounter (Signed)
Requested Prescriptions   Signed Prescriptions Disp Refills   atorvastatin (LIPITOR) 40 MG tablet 90 tablet 0    Sig: Take 1 tablet (40 mg total) by mouth daily.    Authorizing Provider: Rise Mu    Ordering User: Raelene Bott, Mahala Rommel L   clopidogrel (PLAVIX) 75 MG tablet 90 tablet 0    Sig: Take 1 tablet (75 mg total) by mouth daily.    Authorizing Provider: Rise Mu    Ordering User: Raelene Bott, Christerpher Clos L

## 2020-03-07 NOTE — Telephone Encounter (Signed)
Please advise if OK to refill metoprolol. Listed under Historical provider. Not listed on last AVS dated 02/12/2020. Thank you!

## 2020-03-07 NOTE — Telephone Encounter (Signed)
*  STAT* If patient is at the pharmacy, call can be transferred to refill team.   1. Which medications need to be refilled? (please list name of each medication and dose if known)    Atorvastatin 40 mg po q d  Clopidogrel 75 mg po q d  Metoprolol 25 mg po q d  2. Which pharmacy/location (including street and city if local pharmacy) is medication to be sent to?  [facility]   3. Do they need a 30 day or 90 day supply?  [place]

## 2020-03-07 NOTE — Addendum Note (Signed)
Addended by: Raelene Bott, Kinsly Hild L on: 03/07/2020 03:52 PM   Modules accepted: Orders

## 2020-03-07 NOTE — Telephone Encounter (Signed)
Spoke with pt and scheduled her for an appt to discuss her readings with [name] on Thursday. Pt gave a verbal understanding.

## 2020-03-08 NOTE — Telephone Encounter (Signed)
Patient returning call.

## 2020-03-08 NOTE — Telephone Encounter (Signed)
Based on notes, she was prescribed metoprolol before her cath/PCI in June but the medication was stopped after her stent was placed due to hypotension.  I do not believe that it was restarted nor was it listed on her medication list when she was seen for follow-up.  Medication should be stopped and no refills provided.  Thanks.  [name], MD [facility]

## 2020-03-08 NOTE — Telephone Encounter (Signed)
Dr End, please advise if ok to refill. I am having trouble making sure it is ok. Thanks!  Her next appointment with you is 05/18/20.

## 2020-03-08 NOTE — Telephone Encounter (Signed)
Yes-patient states it was given to her after she had heart surgery in June. She also states it was last filled on 02/20/2020 but she would like a 90 day supply of this if she is to continue taking.

## 2020-03-09 ENCOUNTER — Encounter: Payer: Medicare Other | Admitting: *Deleted

## 2020-03-09 ENCOUNTER — Other Ambulatory Visit: Payer: Self-pay

## 2020-03-09 ENCOUNTER — Telehealth: Payer: Self-pay | Admitting: Podiatry

## 2020-03-09 DIAGNOSIS — Z955 Presence of coronary angioplasty implant and graft: Secondary | ICD-10-CM

## 2020-03-09 DIAGNOSIS — G4733 Obstructive sleep apnea (adult) (pediatric): Secondary | ICD-10-CM | POA: Diagnosis not present

## 2020-03-09 DIAGNOSIS — F329 Major depressive disorder, single episode, unspecified: Secondary | ICD-10-CM | POA: Diagnosis not present

## 2020-03-09 DIAGNOSIS — I10 Essential (primary) hypertension: Secondary | ICD-10-CM | POA: Diagnosis not present

## 2020-03-09 DIAGNOSIS — G2581 Restless legs syndrome: Secondary | ICD-10-CM | POA: Diagnosis not present

## 2020-03-09 DIAGNOSIS — I447 Left bundle-branch block, unspecified: Secondary | ICD-10-CM | POA: Diagnosis not present

## 2020-03-09 NOTE — Progress Notes (Signed)
Daily Session Note  Patient Details  Name: Denise Sharp MRN: [id number] Date of Birth: [birth year] Referring Provider:     Cardiac Rehab from 01/25/2020 in [facility] Cardiac and Pulmonary Rehab  Referring Provider Arida (End)      Encounter Date: 03/09/2020  Check In:  Session Check In - 03/09/20 1421      Check-In   Supervising physician immediately available to respond to emergencies See telemetry face sheet for immediately available ER MD    Location [facility]-Cardiac & Pulmonary Rehab    Staff Present Renita Papa, RN Margurite Auerbach, MS Exercise Physiologist;Melissa Caiola RDN, Rowe Pavy, BA, ACSM CEP, Exercise Physiologist    Virtual Visit No    Medication changes reported     No    Fall or balance concerns reported    No    Warm-up and Cool-down Performed on first and last piece of equipment    Resistance Training Performed Yes    VAD Patient? No    PAD/SET Patient? No      Pain Assessment   Currently in Pain? No/denies              Social History   Tobacco Use  Smoking Status Never Smoker  Smokeless Tobacco Never Used    Goals Met:  Independence with exercise equipment Exercise tolerated well No report of cardiac concerns or symptoms Strength training completed today  Goals Unmet:  Not Applicable  Comments: Pt able to follow exercise prescription today without complaint.  Will continue to monitor for progression.    Dr. Emily Filbert is Medical Director for [place] and [facility].

## 2020-03-09 NOTE — Telephone Encounter (Signed)
Pt left message checking status of diabetic shoes.  I returned call and told pt they have shipped but the company website is not telling me where they shipped or if they have came in ([employer] is down). Pt states she is going out of town on the 1st and needs them before that. I told pt I would keep trying and let her know asap about the shoes. She is willing to come to [place] if needed to pick them up.

## 2020-03-09 NOTE — Addendum Note (Signed)
Addended by: Annia Belt on: 03/09/2020 08:51 AM   Modules accepted: Orders

## 2020-03-09 NOTE — Telephone Encounter (Signed)
Patient made aware of Dr Darnelle Bos comments and will remain off metoprolol as she is out of it.

## 2020-03-10 ENCOUNTER — Other Ambulatory Visit: Payer: Self-pay | Admitting: Internal Medicine

## 2020-03-10 ENCOUNTER — Telehealth (INDEPENDENT_AMBULATORY_CARE_PROVIDER_SITE_OTHER): Payer: Medicare Other | Admitting: Internal Medicine

## 2020-03-10 ENCOUNTER — Encounter: Payer: Self-pay | Admitting: Internal Medicine

## 2020-03-10 ENCOUNTER — Telehealth: Payer: Self-pay

## 2020-03-10 ENCOUNTER — Ambulatory Visit (INDEPENDENT_AMBULATORY_CARE_PROVIDER_SITE_OTHER): Payer: Medicare Other | Admitting: Orthotics

## 2020-03-10 ENCOUNTER — Other Ambulatory Visit: Payer: Self-pay

## 2020-03-10 VITALS — BP 122/63 | HR 69 | Temp 98.5°F | Ht 64.5 in | Wt 256.6 lb

## 2020-03-10 DIAGNOSIS — E1140 Type 2 diabetes mellitus with diabetic neuropathy, unspecified: Secondary | ICD-10-CM | POA: Diagnosis not present

## 2020-03-10 DIAGNOSIS — M79676 Pain in unspecified toe(s): Secondary | ICD-10-CM

## 2020-03-10 DIAGNOSIS — M12272 Villonodular synovitis (pigmented), left ankle and foot: Secondary | ICD-10-CM | POA: Diagnosis not present

## 2020-03-10 DIAGNOSIS — M12271 Villonodular synovitis (pigmented), right ankle and foot: Secondary | ICD-10-CM | POA: Diagnosis not present

## 2020-03-10 DIAGNOSIS — I25110 Atherosclerotic heart disease of native coronary artery with unstable angina pectoris: Secondary | ICD-10-CM

## 2020-03-10 DIAGNOSIS — IMO0002 Reserved for concepts with insufficient information to code with codable children: Secondary | ICD-10-CM

## 2020-03-10 DIAGNOSIS — E1149 Type 2 diabetes mellitus with other diabetic neurological complication: Secondary | ICD-10-CM | POA: Diagnosis not present

## 2020-03-10 DIAGNOSIS — B351 Tinea unguium: Secondary | ICD-10-CM

## 2020-03-10 DIAGNOSIS — E1165 Type 2 diabetes mellitus with hyperglycemia: Secondary | ICD-10-CM

## 2020-03-10 MED ORDER — DAPAGLIFLOZIN PROPANEDIOL 5 MG PO TABS: 5.0000 mg | ORAL_TABLET | Freq: Every day | ORAL | 0 refills | Status: DC

## 2020-03-10 NOTE — Progress Notes (Signed)
Telephone  Note  This visit type was conducted due to national recommendations for restrictions regarding the COVID-19 pandemic (e.g. social distancing).  This format is felt to be most appropriate for this patient at this time.  All issues noted in this document were discussed and addressed.  No physical exam was performed (except for noted visual exam findings with Video Visits).   I connected with@ on 03/10/20 at 12:00 PM EDT by  telephone and verified that I am speaking with the correct person using two identifiers. Location patient: home Location provider: work or home office Persons participating in the virtual visit: patient, provider  I discussed the limitations, risks, security and privacy concerns of performing an evaluation and management service by telephone and the availability of in person appointments. I also discussed with the patient that there may be a patient responsible charge related to this service. The patient expressed understanding and agreed to proceed.  Reason for visit: follow up on type 2 DM   HPI:  74 yr old type 2 DM with obesity,  Recent NSTEMI s/p  PTCA /stent June 2021 now on lipitor,  plavix and ASA presents for diabetes follow up  DM:  Has been taking lantus 15 units daily  Since discharge.  Using the  freestyle [place] monitor.  Eating less because of stricture at GE junction  but gaining weight steadily.  States that she has gained  10 lbs since discharge from hospital. Refuses to take metformin , thinks it should be taken off the market   Activity somewhat limited:  Attending cardiopulmonary rehab but no other form of exercise .    Aug 21:  5 am 103     The rest of her BS were > 160 all day.   August 25  119 fasting   189 11 pm   Today fasting 117   11 am 136    Had a nosebleed last night.  Uses CPAP .     ROS: See pertinent positives and negatives per HPI.  Past Medical History:  Diagnosis Date  . Abnormality of gait 03/25/2013  . Adrenal mass, left  ([place])   . Anemia    iron deficient post  2 unit txfsn 2009, normal endo/colonoscopy by [facility]  . Arthritis   . Atypical chest pain    a. 04/1986 Cath ([facility]): nl cors, EF 65%; b. 07/2012 Cath [facility]): LM nl, LAD/LCX/RCA w/ min irregs; c. 07/2016 MV [facility]): "Equivocal" w/ small inf wall rev defect and hyperdynamic LV contraction, EF 87%; d. 08/2016 Cardiac CT Ca2+ score [facility]): Ca2+ score 1548; e. 05/2019 MV: No ischemia. Diaph atten. EF 75%.  . Cervical spinal stenosis 1994   due to trauma to back ([employer] accident), has intermittent paralysis and parasthesias  . Cervicogenic headache 03/23/2014  . Depression   . Diastolic dysfunction    a. 12/2019 Echo: EF 65-70%, Gr2 DD. No significant valvular dzs.  . Dizziness    chronic dizziness  . DJD (degenerative joint disease)    a. Chronic R shoulder pain pending R shoulder replacement 07/2017.  [name] Esophageal stenosis March 2011   with transietn outlet obstruction by food, cleared by EGD   . Family history of adverse reaction to anesthesia    daughter PONV  . Gastric bypass status for obesity   . Headache(784.0)   . Hypertension   . IBS (irritable bowel syndrome)   . Left bundle branch block (LBBB)    a. Intermittently present - likely rate related. - patient  denies  . Obesity   . Obstructive sleep apnea    using CPAP  . 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 Restless leg syndrome   . Rotator cuff arthropathy, right 08/13/2017  . Syncope and collapse 03/12/2014  . Type II diabetes mellitus ([place])     Past Surgical History:  Procedure Laterality Date  . ABDOMINAL HYSTERECTOMY    . APPENDECTOMY    . COLONOSCOPY    . CORONARY STENT INTERVENTION N/A 01/04/2020   Procedure: CORONARY STENT INTERVENTION;  Surgeon: [name], MD;  Location: [facility];  Service: Cardiovascular;  Laterality: N/A;  LAD   . DIAPHRAGMATIC HERNIA REPAIR  2015  . ESOPHAGEAL DILATION     multiple  . ESOPHAGOGASTRODUODENOSCOPY    . [place]  . GALLBLADDER SURGERY  resection  . GASTRIC BYPASS    . GASTRIC BYPASS  2000, 2005   Dr. Debroah Loop  . [facility] IMPLANT PLACEMENT  April 2013   [place]  . JOINT REPLACEMENT  2007   bilateral knee. [name],  [name]  . LEFT HEART CATH AND CORS/GRAFTS ANGIOGRAPHY N/A 12/31/2019   Procedure: LEFT HEART CATH AND CORS/GRAFTS ANGIOGRAPHY;  Surgeon: [name], MD;  Location: [place] [facility];  Service: Cardiovascular;  Laterality: N/A;  . PANNICULECTOMY  06/16/2019  . PANNICULECTOMY N/A 06/16/2019   Procedure: PANNICULECTOMY;  Surgeon: [name], MD;  Location: [place];  Service: Plastics;  Laterality: N/A;  3 hours, please  . REVERSE SHOULDER ARTHROPLASTY Right 08/13/2017   Procedure: REVERSE RIGHT SHOULDER ARTHROPLASTY;  Surgeon: [name], MD;  Location: [place];  Service: Orthopedics;  Laterality: Right;  . ROTATOR CUFF REPAIR     right  . [place]  . TOTAL ABDOMINAL HYSTERECTOMY W/ BILATERAL SALPINGOOPHORECTOMY  1974  . UMBILICAL HERNIA REPAIR  Aug 11, 2015    Family History  Problem Relation Age of Onset  . Heart disease Father   . Hypertension Father   . Prostate cancer Father   . Stroke Father   . Osteoporosis Father   . Stroke Mother   . Depression Mother   . Headache Mother   . Heart disease Mother   . Thyroid disease Mother   . Hypertension Mother   . Diabetes Daughter   . Heart disease Daughter   . Hypertension Daughter   . Hypertension Son     SOCIAL HX:  reports that she has never smoked. She has never used smokeless tobacco. She reports that she does not drink alcohol and does not use drugs.   Current Outpatient Medications:  .  albuterol (VENTOLIN HFA) 108 (90 Base) MCG/ACT inhaler, Inhale 2 puffs into the lungs every 8 (eight) hours as needed for wheezing or shortness of breath., Disp: 8 g, Rfl: 2 .  aspirin EC 81 MG tablet, Take 1 tablet (81 mg total) by mouth daily., Disp: 30 tablet, Rfl: 2 .  atorvastatin  (LIPITOR) 40 MG tablet, Take 1 tablet (40 mg total) by mouth daily., Disp: 90 tablet, Rfl: 0 .  benzonatate (TESSALON) 200 MG capsule, Take 1 capsule (200 mg total) by mouth 2 (two) times daily as needed for cough., Disp: 30 capsule, Rfl: 5 .  blood glucose meter kit and supplies, Use to check sugars up to three times daily. Dx code: E11.9, Disp: 1 each, Rfl: 0 .  Blood Glucose Monitoring Suppl (ONETOUCH VERIO) w/Device KIT, 1 Device by Does not apply route daily. Use to test blood  glucose once daily. Dx code = E11.9, Disp: 1 kit, Rfl: 0 .  buPROPion (WELLBUTRIN) 75 MG tablet, TAKE 2 TABLETS BY MOUTH EVERY MORNING AND 1 TABLET IN THE AFTERNOON, Disp: 270 tablet, Rfl: 1 .  butalbital-acetaminophen-caffeine (FIORICET) 50-325-40 MG tablet, Take 1 tablet by mouth every 6 (six) hours as needed for headache., Disp: 90 tablet, Rfl: 5 .  citalopram (CELEXA) 10 MG tablet, Take 1 tablet (10 mg total) by mouth daily., Disp: 90 tablet, Rfl: 1 .  clopidogrel (PLAVIX) 75 MG tablet, Take 1 tablet (75 mg total) by mouth daily., Disp: 90 tablet, Rfl: 0 .  clotrimazole-betamethasone (LOTRISONE) cream, Apply 1 application topically 2 (two) times daily. (Patient taking differently: Apply 1 application topically 2 (two) times daily as needed (rash). ), Disp: 45 g, Rfl: 3 .  Continuous Blood Gluc Receiver (FREESTYLE LIBRE 14 DAY READER) DEVI, 1 applicator by Does not apply route 4 (four) times daily., Disp: 1 each, Rfl: 6 .  Continuous Blood Gluc Sensor (FREESTYLE LIBRE 14 DAY SENSOR) MISC, Use to test blood sugar 4 times daily, Disp: 1 each, Rfl: 6 .  cyanocobalamin (,VITAMIN B-12,) 1000 MCG/ML injection, Inject 1 ml (1000 mcg ) IM weekly x 4,  Then monthly thereafter (Patient taking differently: Inject 1,000 mcg into the muscle every 30 (thirty) days. On the 12th day), Disp: 110 mL, Rfl: 0 .  diazepam (VALIUM) 5 MG tablet, Take 1 tablet (5 mg total) by mouth at bedtime as needed for anxiety., Disp: 30 tablet, Rfl: 5 .   dicyclomine (BENTYL) 10 MG capsule, Take 1 capsule (10 mg total) by mouth 4 (four) times daily -  before meals and at bedtime., Disp: 360 capsule, Rfl: 3 .  diphenhydrAMINE (BENADRYL) 25 MG tablet, Take 50 mg by mouth daily as needed for allergies., Disp: , Rfl:  .  diphenoxylate-atropine (LOMOTIL) 2.5-0.025 MG tablet, Take 1 tablet by mouth 4 (four) times daily as needed for diarrhea or loose stools., Disp: 60 tablet, Rfl: 0 .  DROPLET PEN NEEDLES 31G X 5 MM MISC, USE ONE NEEDLE SUBCUTANEOUSLY AS DIRECTED (REMOVE AND DISCARD NEEDLE IN SHARPS CONTAINER IMMEDIATELY AFTER USE), Disp: 100 each, Rfl: 1 .  Eszopiclone 3 MG TABS, Take 1 tablet (3 mg total) by mouth at bedtime., Disp: 90 tablet, Rfl: 1 .  hyoscyamine (ANASPAZ) 0.125 MG TBDP disintergrating tablet, DISSOLVE UNDER TONGUE 15 MINUTES BEFORE MEALS, Disp: 60 tablet, Rfl: 0 .  Insulin Glargine (LANTUS SOLOSTAR) 100 UNIT/ML Solostar Pen, Inject 11 Units into the skin daily. (Patient taking differently: Inject 10-15 Units into the skin daily. ), Disp: 5 pen, Rfl: 3 .  ipratropium (ATROVENT) 0.06 % nasal spray, Place 2 sprays into both nostrils 4 (four) times daily., Disp: 15 mL, Rfl: 12 .  Lancet Devices (ONE TOUCH DELICA LANCING DEV) MISC, Use to test blood glucose once daily. Dx code = E11.9, Disp: 1 each, Rfl: 0 .  levETIRAcetam (KEPPRA) 750 MG tablet, Take 1 tablet (750 mg total) by mouth at bedtime. (Patient taking differently: Take 750 mg by mouth at bedtime as needed (restless legs). ), Disp: 90 tablet, Rfl: 3 .  neomycin-polymyxin-hydrocortisone (CORTISPORIN) OTIC solution, Place 3 drops into both ears 4 (four) times daily. For otitis externa, Disp: 10 mL, Rfl: 0 .  nitroGLYCERIN (NITROSTAT) 0.4 MG SL tablet, Place 1 tablet (0.4 mg total) under the tongue every 5 (five) minutes as needed for chest pain., Disp: 20 tablet, Rfl: 1 .  NONFORMULARY OR COMPOUNDED ITEM, [facility]  Combination Pain Cream -  Baclofen 2%, Doxepin 5%, Gabapentin  6%, Topiramate 2%, Pentoxifylline 3% Apply 1-2 grams to affected area 3-4 times daily Qty. 120 gm 3 refills, Disp: 1 each, Rfl: 3 .  nystatin (NYAMYC) powder, Apply 1 g topically 4 (four) times daily as needed (rash). , Disp: , Rfl:  .  ondansetron (ZOFRAN ODT) 4 MG disintegrating tablet, Take 1 tablet (4 mg total) by mouth every 8 (eight) hours as needed for nausea or vomiting., Disp: 20 tablet, Rfl: 0 .  ONETOUCH VERIO test strip, daily., Disp: , Rfl:  .  OVER THE COUNTER MEDICATION, Apply 1 application topically daily as needed (pain). [place], Disp: , Rfl:  .  pregabalin (LYRICA) 100 MG capsule, Take 100 mg by mouth at bedtime as needed (leg cramps). , Disp: , Rfl:  .  Syringe/Needle, Disp, (SYRINGE 3CC/25GX1") 25G X 1" 3 ML MISC, Use for b12 injections, Disp: 50 each, Rfl: 0 .  traMADol (ULTRAM) 50 MG tablet, Take 0.5 tablets (25 mg total) by mouth every 12 (twelve) hours as needed for severe pain., Disp: 30 tablet, Rfl: 0 .  umeclidinium-vilanterol (ANORO ELLIPTA) 62.5-25 MCG/INH AEPB, Inhale 1 puff into the lungs daily., Disp: 1 each, Rfl: 12 .  dapagliflozin propanediol (FARXIGA) 5 MG TABS tablet, Take 1 tablet (5 mg total) by mouth daily before breakfast., Disp: 90 tablet, Rfl: 0  EXAM:   General impression: alert, cooperative and articulate.  No signs of being in distress  Lungs: speech is fluent sentence length suggests that patient is not short of breath and not punctuated by cough, sneezing or sniffing. [name]   Psych: affect normal.  speech is articulate and non pressured .  Denies suicidal thoughts    ASSESSMENT AND PLAN:  Discussed the following assessment and plan:  Uncontrolled type 2 diabetes with neuropathy ([place]) - Plan: Ambulatory referral to Chronic Care Management Services  Coronary artery disease involving native coronary artery of native heart with unstable angina pectoris ([place])  Morbid obesity ([place])  Coronary artery disease involving native coronary artery of  native heart with unstable angina pectoris ([place]) Participating in Cardiopulmonary rehab .  Taking lipitor asa, plavix and metoprolol  Adding Wilder Glade if insurance will cover.   Uncontrolled type 2 diabetes with neuropathy (H[facility]) Currently taking only Lantus 15 units daily.  Will not take metformin.  Using [facility] and noting fasting normoglycemia with post prandial hyperglycemia . Adding Blanche [place],  CCM consult to assist with medication procurement and management of data.   Morbid obesity ([place]) Despite prior bariatric surgery and more recently, panniulectomy.  She reports a 10 lb  weight gain since discharge from rehab , despite reporting  restricted food intake due to recurrent dysphagia secondary to GE stricture (13 mm barium tablet would not pass on DG esophagus). Historically the only time she lost weight was when her daughter cooked for her on the [place] diet      I discussed the assessment and treatment plan with the patient. The patient was provided an opportunity to ask questions and all were answered. The patient agreed with the plan and demonstrated an understanding of the instructions.   The patient was advised to call back or seek an in-person evaluation if the symptoms worsen or if the condition fails to improve as anticipated.  I provided 30 minutes of non-face-to-face time during this encounter.   [name], MD

## 2020-03-10 NOTE — Assessment & Plan Note (Signed)
Currently taking only Lantus 15 units daily.  Will not take metformin.  Using [facility] and noting fasting normoglycemia with post prandial hyperglycemia . Adding Blanche [place],  CCM consult to assist with medication procurement and management of data.

## 2020-03-10 NOTE — Assessment & Plan Note (Signed)
Participating in Cardiopulmonary rehab .  Taking lipitor asa, plavix and metoprolol  Adding Wilder Glade if insurance will cover.

## 2020-03-10 NOTE — Chronic Care Management (AMB) (Signed)
  Chronic Care Management   Note  03/10/2020 Name: JIANNI SHELDEN MRN: [id number] DOB: August 19, 1945  ORLINDA SLOMSKI is a 74 y.o. year old female who is a primary care patient of [name], Aris Everts, MD. I reached out to [name] by phone today in response to a referral sent by Ms. Tia Masker Streeper's PCP, [name]     Ms. Lampkins was given information about Chronic Care Management services today including:  1. CCM service includes personalized support from designated clinical staff supervised by her physician, including individualized plan of care and coordination with other care providers 2. 24/7 contact phone numbers for assistance for urgent and routine care needs. 3. Service will only be billed when office clinical staff spend 20 minutes or more in a month to coordinate care. 4. Only one practitioner may furnish and bill the service in a calendar month. 5. The patient may stop CCM services at any time (effective at the end of the month) by phone call to the office staff. 6. The patient will be responsible for cost sharing (co-pay) of up to 20% of the service fee (after annual deductible is met).  Patient agreed to services and verbal consent obtained.   Follow up plan: Telephone appointment with care management team member scheduled for:03/15/2020  Noreene Larsson, [place], [place], [place] [address] Direct Dial: [phone] [email or web address]

## 2020-03-10 NOTE — Assessment & Plan Note (Signed)
Despite prior bariatric surgery and more recently, panniulectomy.  She reports a 10 lb  weight gain since discharge from rehab , despite reporting  restricted food intake due to recurrent dysphagia secondary to GE stricture (13 mm barium tablet would not pass on DG esophagus). Historically the only time she lost weight was when her daughter cooked for her on the [place]

## 2020-03-15 ENCOUNTER — Ambulatory Visit (INDEPENDENT_AMBULATORY_CARE_PROVIDER_SITE_OTHER): Payer: Medicare Other | Admitting: Pharmacist

## 2020-03-15 DIAGNOSIS — E1140 Type 2 diabetes mellitus with diabetic neuropathy, unspecified: Secondary | ICD-10-CM

## 2020-03-15 DIAGNOSIS — E1165 Type 2 diabetes mellitus with hyperglycemia: Secondary | ICD-10-CM

## 2020-03-15 DIAGNOSIS — I25110 Atherosclerotic heart disease of native coronary artery with unstable angina pectoris: Secondary | ICD-10-CM

## 2020-03-15 DIAGNOSIS — IMO0002 Reserved for concepts with insufficient information to code with codable children: Secondary | ICD-10-CM

## 2020-03-15 NOTE — Chronic Care Management (AMB) (Signed)
Chronic Care Management   Note  03/15/2020 Name: Denise Sharp MRN: [id number] DOB: [birth year]   Subjective:  Denise Sharp is a 74 y.o. year old female who is a primary care patient of [name], Aris Everts, MD. The CCM team was consulted for assistance with chronic disease management and care coordination needs.    Contacted patient for medication management review.   Denise Sharp was given information about Chronic Care Management services today including:  1. CCM service includes personalized support from designated clinical staff supervised by her physician, including individualized plan of care and coordination with other care providers 2. 24/7 contact phone numbers for assistance for urgent and routine care needs. 3. Service will only be billed when office clinical staff spend 20 minutes or more in a month to coordinate care. 4. Only one practitioner may furnish and bill the service in a calendar month. 5. The patient may stop CCM services at any time (effective at the end of the month) by phone call to the office staff. 6. The patient will be responsible for cost sharing (co-pay) of up to 20% of the service fee (after annual deductible is met).  Patient agreed to services and verbal consent obtained.   Review of patient status, including review of consultants reports, laboratory and other test data, was performed as part of comprehensive evaluation and provision of chronic care management services.   SDOH (Social Determinants of Health) assessments and interventions performed:  SDOH Interventions     Most Recent Value  SDOH Interventions  Financial Strain Interventions Intervention Not Indicated       Objective:  Lab Results  Component Value Date   CREATININE 1.11 (H) 01/25/2020   CREATININE 1.01 01/22/2020   CREATININE 1.22 (H) 01/13/2020    Lab Results  Component Value Date   HGBA1C 7.6 (H) 01/25/2020       Component Value Date/Time   CHOL 183 12/31/2019  0115   CHOL 155 02/27/2014 0420   TRIG 140 12/31/2019 0115   TRIG 121 02/27/2014 0420   HDL 42 12/31/2019 0115   HDL 43 02/27/2014 0420   CHOLHDL 4.4 12/31/2019 0115   VLDL 28 12/31/2019 0115   VLDL 24 02/27/2014 0420   LDLCALC 113 (H) 12/31/2019 0115   LDLCALC 88 02/27/2014 0420   LDLDIRECT 111.0 06/16/2015 0936    Clinical ASCVD: No  The 10-year ASCVD risk score (Goff DC Jr., et al., 2013) is: 42.6%   Values used to calculate the score:     Age: 18 years     Sex: Female     Is Non-Hispanic African American: No     Diabetic: Yes     Tobacco smoker: Yes     Systolic Blood Pressure: 382 mmHg     Is BP treated: Yes     HDL Cholesterol: 42 mg/dL     Total Cholesterol: 183 mg/dL    BP Readings from Last 3 Encounters:  03/10/20 122/63  02/23/20 (!) 132/59  02/12/20 (!) 148/80    Allergies  Allergen Reactions  . Demeclocycline Hives  . Erythromycin Nausea And Vomiting and Other (See Comments)    Severe irritable bowel  . Flagyl [Metronidazole] Nausea And Vomiting and Other (See Comments)    Severe irritable bowel  . Glucophage [Metformin Hcl] Nausea And Vomiting and Other (See Comments)    "Sick" "I won't take anything that has metformin in it"  . Tetracyclines & Related Hives and Rash  . Diovan [Valsartan] Nausea Only       .  Sulfa Antibiotics Rash and Other (See Comments)    As child  . Xanax [Alprazolam] Other (See Comments)    Unknown reaction    Medications Reviewed Today    Reviewed by De Hollingshead, [facility] (Pharmacist) on 03/15/20 at [phone]  Med List Status: <None>  Medication Order Taking? Sig Documenting Provider Last Dose Status Informant  albuterol (VENTOLIN HFA) 108 (90 Base) MCG/ACT inhaler [id number] Yes Inhale 2 puffs into the lungs every 8 (eight) hours as needed for wheezing or shortness of breath. [name], MD Taking Active Self  aspirin EC 81 MG tablet [id number] Yes Take 1 tablet (81 mg total) by mouth daily. Fritzi Mandes, MD Taking Active    atorvastatin (LIPITOR) 40 MG tablet [id number] Yes Take 1 tablet (40 mg total) by mouth daily. Rise Mu, PA-C Taking Active   benzonatate (TESSALON) 200 MG capsule [id number] Yes Take 1 capsule (200 mg total) by mouth 2 (two) times daily as needed for cough. [name], MD Taking Active   buPROPion [facility]) 75 MG tablet [id number] Yes TAKE 2 TABLETS BY MOUTH EVERY MORNING AND 1 TABLET IN THE AFTERNOON [name], MD Taking Active   butalbital-acetaminophen-caffeine (FIORICET) 50-325-40 MG tablet [id number] Yes Take 1 tablet by mouth every 6 (six) hours as needed for headache. [name], MD Taking Active Self  citalopram (CELEXA) 10 MG tablet [id number] Yes Take 1 tablet (10 mg total) by mouth daily. [name], MD Taking Active   clopidogrel (PLAVIX) 75 MG tablet [id number] Yes Take 1 tablet (75 mg total) by mouth daily. Rise Mu, PA-C Taking Active   clotrimazole-betamethasone Donalynn Furlong) cream [id number] Yes Apply 1 application topically 2 (two) times daily.  Patient taking differently: Apply 1 application topically 2 (two) times daily as needed (rash).    Gae Dry, MD Taking Active Self  Continuous Blood Gluc Receiver (FREESTYLE LIBRE 14 DAY READER) [place]VI [id number] Yes 1 applicator by Does not apply route 4 (four) times daily. [name], MD Taking Active Self  Continuous Blood Gluc Sensor (FREESTYLE LIBRE [place]) [place] [id number] Yes Use to test blood sugar 4 times daily [name], MD Taking Active Self  cyanocobalamin (,VITAMIN B-12,) 1000 MCG/ML injection [id number] Yes Inject 1 ml (1000 mcg ) IM weekly x 4,  Then monthly thereafter  Patient taking differently: Inject 1,000 mcg into the muscle every 30 (thirty) days. On the 12th day   [name], MD Taking Active Self  dapagliflozin propanediol (FARXIGA) 5 MG TABS tablet [id number] Yes Take 1 tablet (5 mg total) by mouth daily before breakfast. [name], MD Taking Active   diazepam  (VALIUM) 5 MG tablet [id number] Yes Take 1 tablet (5 mg total) by mouth at bedtime as needed for anxiety. [name], MD Taking Active Self           Med Note ([place] Mar 15, 2020  9:21 AM)    dicyclomine (BENTYL) 10 MG capsule [id number] Yes Take 1 capsule (10 mg total) by mouth 4 (four) times daily -  before meals and at bedtime. [name], MD Taking Active Self           Med Note Gillis Santa Dec 31, 2019  2:50 AM) Pt takes differently: take 1 capsule 4 times a day as needed.  diphenhydrAMINE (BENADRYL) 25 MG tablet [id number] No Take 50 mg by mouth daily as needed for allergies.  Patient not taking: Reported on 03/15/2020   [provider] Not Taking Active Self  diphenoxylate-atropine (LOMOTIL) 2.5-0.025 MG tablet [id number] No Take 1 tablet by mouth 4 (four) times daily as needed for diarrhea or loose stools.  Patient not taking: Reported on 03/15/2020   [name], MD Not Taking Active Self  DROPLET PEN NEEDLES 31G X 5 MM MISC [id number] Yes USE ONE NEEDLE SUBCUTANEOUSLY AS DIRECTED (REMOVE AND DISCARD NEEDLE IN SHARPS CONTAINER IMMEDIATELY AFTER USE) [name], MD Taking Active   Eszopiclone 3 MG TABS [id number] Yes Take 1 tablet (3 mg total) by mouth at bedtime. [name], MD Taking Active Self  hyoscyamine (ANASPAZ) 0.125 MG TBDP disintergrating tablet [id number] Yes DISSOLVE UNDER TONGUE 15 MINUTES BEFORE MEALS [name], MD Taking Active   Insulin Glargine (LANTUS SOLOSTAR) 100 UNIT/ML Solostar Pen [id number] Yes Inject 11 Units into the skin daily.  Patient taking differently: Inject 10-15 Units into the skin daily.    [name], MD Taking Active Self  ipratropium (ATROVENT) 0.06 % nasal spray [id number] Yes Place 2 sprays into both nostrils 4 (four) times daily. [name], MD Taking Active Self           Med Note Gillis Santa Dec 31, 2019  2:55 AM) Pt takes differently: Place 2 sprays into each nostril 4  times daily as needed.  levETIRAcetam (KEPPRA) 750 MG tablet [id number] Yes Take 1 tablet (750 mg total) by mouth at bedtime.  Patient taking differently: Take 750 mg by mouth at bedtime as needed (restless legs).    Kathrynn Ducking, MD Taking Active Self           Med Note Gillis Santa Dec 31, 2019  2:58 AM) Pt takes differently: Take 1 tablet at bedtime as needed.  neomycin-polymyxin-hydrocortisone (CORTISPORIN) OTIC solution [id number] Yes Place 3 drops into both ears 4 (four) times daily. For otitis externa [name], MD Taking Active   nitroGLYCERIN (NITROSTAT) 0.4 MG SL tablet [id number] No Place 1 tablet (0.4 mg total) under the tongue every 5 (five) minutes as needed for chest pain.  Patient not taking: Reported on 03/15/2020   Fritzi Mandes, MD Not Taking Active   NONFORMULARY OR COMPOUNDED [place] [id number] Yes [facility]  Combination Pain Cream -  Baclofen 2%, Doxepin 5%, Gabapentin 6%, Topiramate 2%, Pentoxifylline 3% Apply 1-2 grams to affected area 3-4 times daily Qty. 120 gm 3 refills Gardiner Barefoot, DPM Taking Active Self           Med Note Rocky Crafts   Thu Dec 31, 2019  3:01 AM) Pt takes differently: Apply 3-4 times daily as needed.  nystatin [facility]) powder [id number] No Apply 1 g topically 4 (four) times daily as needed (rash).   Patient not taking: Reported on 03/15/2020   [provider] Not Taking Active Self  ondansetron (ZOFRAN ODT) 4 MG disintegrating tablet [id number] No Take 1 tablet (4 mg total) by mouth every 8 (eight) hours as needed for nausea or vomiting.  Patient not taking: Reported on 03/15/2020   [name], MD Not Taking Active Self  OVER THE COUNTER MEDICATION [id number] Yes Apply 1 application topically daily as needed (pain). [place] Gell [provider] Taking Active Self  pregabalin (LYRICA) 100 MG capsule [id number] Yes Take 100 mg by mouth at bedtime as needed (leg cramps).  [provider] Taking Active  Self  Med Note Caryn Section, [place] A   Fri Jun 05, 2019  9:41 AM)    traMADol (ULTRAM) 50 MG tablet [id number] Yes Take 0.5 tablets (25 mg total) by mouth every 12 (twelve) hours as needed for severe pain. Elmarie Shiley, MD Taking Active Self           Med Note Rocky Crafts   Thu Dec 31, 2019  3:03 AM) Pt takes differently: Take 1 tablet (50 mg) every 12 hours as needed for severe pain.  umeclidinium-vilanterol (ANORO ELLIPTA) 62.5-25 MCG/INH AEPB [id number] No Inhale 1 puff into the lungs daily.  Patient not taking: Reported on 03/15/2020   Deneise Lever, MD Not Taking Active Self  Med List Note Jaymes Graff 07/22/17 1307): CPAP           Assessment:   Goals Addressed              This Visit's Progress     Patient Stated   .  PharmD "I want to control my blood sugars better" (pt-stated)        CARE PLAN ENTRY (see longitudinal plan of care for additional care plan information)  Current Barriers:  . Social, financial, community barriers:  o Denies cost concerns with medications at this time. Receives most medications from [facility], occasional medications at local pharmacy if she cannot wait on [place] shipping o Patient reports weight gain since discharge, even though she has been eating in moderation. Does not believe it to be fluid related. Wonders about meeting with someone to discuss meal planning. Not interested in [facility] . Diabetes: uncontrolled; complicated by chronic medical conditions including recent angina s/p stent placement x2, s/p gastric bypass, HFpEF, HTN, depression, most recent A1c 7.5% . Most recent eGFR: ~49 mL/min . Current antihyperglycemic regimen: Lantus 10-15 units daily, Farxiga 5 mg daily - has not received from [facility] yet.  . Denies hypoglycemic symptoms, including dizziness, lightheadedness, shaking, sweating . Current exercise: limited by mobility . Current blood glucose readings: utilizing FreeStyle Libre CGM,  wonders about strategies for keeping adhered when in a swimming pool Date of Review: 03/15/20 Average Glucose: 149 mg/dL Time in Goal: (target 100-140) - Time in range 70-180: 56% - Time above range: 40% - Time below range: 4% . Cardiovascular risk reduction: o Current hypertensive regimen: n/a o Current hyperlipidemia regimen: atorvastatin 40 mg daily o Current antiplatelet regimen: ASA 81 mg daily, clopidogrel 75 mg daily . Eye exam: due . Nephropathy screening: up to date . Foot Exam: due . Depression/Anxiety: citalopram 10 mg daily, bupropion 150 mg QAM, 75 mg QPM, diazepam 5 mg QPM, Lunesta 3 mg QPM . GI (s/p gastric bypass, chronic diarrhea): dicyclomine 10 mg QID PRN, hyoscyamine 10 mg QID PRN; reports some improvement in diarrhea s/p cardiac hospitalization, though unsure why . "Leg cramps": pregabalin 100 mg QPM PRN, levetiracetam 500 mg QPM PRN . COPD: Anoro daily PRN, albuterol HFA PRN  . Allergies/chronic cough: ipratropium nasal PRN, reports more nose bleeds since being on DAPT  Pharmacist Clinical Goal(s):  [name] Over the next 90 days, patient will work with PharmD and primary care provider to address optimized medication management  Interventions: . Comprehensive medication review performed, medication list updated in electronic medical record . Inter-disciplinary care team collaboration (see longitudinal plan of care) . Reviewed goal A1c, goal fasting, goal 2 hour post prandial. Recommended patient adjust goal glucose range to 80-180 . Sent email invitation to [employer] . Should receive  Farxiga in ~1-2 weeks from [place]. Recommended patient reduce Lantus to 8 units daily when she starts [place] to reduce risk of hypoglycemia.  . Discussed mechanism of [place]. Discussed risk of GU infections.  . Discussed utilizing bandages like Tegaderm to protect sensor while swimming.  . Discussed utilizing nasal saline after ipratropium to rehydrate nasal passages and reduce risk of  nosebleeds. Recommended trial of daily antihistamine (generic Zyrtec, Claritin, Allegra, or Xyzal) to target congestion. Recommended avoiding anything w/ diphenhydramine or decongestants . Will discuss w/ PCP regarding any dietician options that are not [place] [facility].   Patient Self Care Activities:  . Patient will scan glucose at least QID , document, and provide at future appointments . Patient will take medications as prescribed . Patient will report any questions or concerns to provider   Initial goal documentation        Plan: - Scheduled f/u call in ~ 5 weeks  Catie Darnelle Maffucci, PharmD, [place], [place] Pharmacist [place] [phone]

## 2020-03-15 NOTE — Patient Instructions (Addendum)
Denise Sharp,   It was great talking with you today!  Follow the instructions in the email from [place] to connect to our [employer] patient portal.   I recommend bandages like Tegaderm to help protect the [place] sensor while swimming.   When you start Farxiga, I recommend you reduce the Lantus to 8 units daily.   Try something like nasal saline to re-hydrate your nasal passages after use of ipratropium. I also recommend trying a daily antihistamine (generic Zyrtec, Claritin, Allegra, or Xyzal) to see if this helps the congestion/cough. Avoid anything with diphenhydramine (Benadryl) or decongestants (like phenylephrine or pseudophedrine).   Call me with any questions!  Catie Darnelle Maffucci, PharmD [phone]   Visit Information  Goals Addressed              This Visit's Progress     Patient Stated   .  PharmD "I want to control my blood sugars better" (pt-stated)        CARE PLAN ENTRY (see longitudinal plan of care for additional care plan information)  Current Barriers:  . Social, financial, community barriers:  o Denies cost concerns with medications at this time. Receives most medications from [facility], occasional medications at local pharmacy if she cannot wait on [place] shipping o Patient reports weight gain since discharge, even though she has been eating in moderation. Does not believe it to be fluid related. Wonders about meeting with someone to discuss meal planning. Not interested in [facility] . Diabetes: uncontrolled; complicated by chronic medical conditions including recent angina s/p stent placement x2, s/p gastric bypass, HFpEF, HTN, depression, most recent A1c 7.5% . Most recent eGFR: ~49 mL/min . Current antihyperglycemic regimen: Lantus 10-15 units daily, Farxiga 5 mg daily - has not received from [facility] yet.  . Denies hypoglycemic symptoms, including dizziness, lightheadedness, shaking, sweating . Current exercise: limited by  mobility . Current blood glucose readings: utilizing FreeStyle Libre CGM, wonders about strategies for keeping adhered when in a swimming pool Date of Review: 03/15/20 Average Glucose: 149 mg/dL Time in Goal: (target 100-140) - Time in range 70-180: 56% - Time above range: 40% - Time below range: 4% . Cardiovascular risk reduction: o Current hypertensive regimen: n/a o Current hyperlipidemia regimen: atorvastatin 40 mg daily o Current antiplatelet regimen: ASA 81 mg daily, clopidogrel 75 mg daily . Eye exam: due . Nephropathy screening: up to date . Foot Exam: due . Depression/Anxiety: citalopram 10 mg daily, bupropion 150 mg QAM, 75 mg QPM, diazepam 5 mg QPM, Lunesta 3 mg QPM . GI (s/p gastric bypass, chronic diarrhea): dicyclomine 10 mg QID PRN, hyoscyamine 10 mg QID PRN; reports some improvement in diarrhea s/p cardiac hospitalization, though unsure why . "Leg cramps": pregabalin 100 mg QPM PRN, levetiracetam 500 mg QPM PRN . COPD: Anoro daily PRN, albuterol HFA PRN  . Allergies/chronic cough: ipratropium nasal PRN, reports more nose bleeds since being on DAPT  Pharmacist Clinical Goal(s):  [name] Over the next 90 days, patient will work with PharmD and primary care provider to address optimized medication management  Interventions: . Comprehensive medication review performed, medication list updated in electronic medical record . Inter-disciplinary care team collaboration (see longitudinal plan of care) . Reviewed goal A1c, goal fasting, goal 2 hour post prandial. Recommended patient adjust goal glucose range to 80-180 . Sent email invitation to [employer] . Should receive Farxiga in ~1-2 weeks from [place]. Recommended patient reduce Lantus to 8 units daily when she starts [place] to reduce  risk of hypoglycemia.  . Discussed mechanism of [place]. Discussed risk of GU infections.  . Discussed utilizing bandages like Tegaderm to protect sensor while swimming.  . Discussed utilizing nasal  saline after ipratropium to rehydrate nasal passages and reduce risk of nosebleeds. Recommended trial of daily antihistamine (generic Zyrtec, Claritin, Allegra, or Xyzal) to target congestion. Recommended avoiding anything w/ diphenhydramine or decongestants . Will discuss w/ PCP regarding any dietician options that are not [place] [facility].   Patient Self Care Activities:  . Patient will scan glucose at least QID , document, and provide at future appointments . Patient will take medications as prescribed . Patient will report any questions or concerns to provider   Initial goal documentation        [name] was given information about Chronic Care Management services today including:  1. CCM service includes personalized support from designated clinical staff supervised by her physician, including individualized plan of care and coordination with other care providers 2. 24/7 contact phone numbers for assistance for urgent and routine care needs. 3. Service will only be billed when office clinical staff spend 20 minutes or more in a month to coordinate care. 4. Only one practitioner may furnish and bill the service in a calendar month. 5. The patient may stop CCM services at any time (effective at the end of the month) by phone call to the office staff. 6. The patient will be responsible for cost sharing (co-pay) of up to 20% of the service fee (after annual deductible is met).  Patient agreed to services and verbal consent obtained.   The patient verbalized understanding of instructions provided today and agreed to receive a mailed copy of patient instruction and/or educational materials.   Plan: - Scheduled f/u call in ~ 5 weeks  Catie Darnelle Maffucci, PharmD, [place], [place] Pharmacist [place] [phone]

## 2020-03-16 ENCOUNTER — Encounter: Payer: Medicare Other | Attending: Cardiovascular Disease

## 2020-03-16 DIAGNOSIS — F329 Major depressive disorder, single episode, unspecified: Secondary | ICD-10-CM | POA: Insufficient documentation

## 2020-03-16 DIAGNOSIS — I10 Essential (primary) hypertension: Secondary | ICD-10-CM | POA: Insufficient documentation

## 2020-03-16 DIAGNOSIS — Z7982 Long term (current) use of aspirin: Secondary | ICD-10-CM | POA: Insufficient documentation

## 2020-03-16 DIAGNOSIS — I447 Left bundle-branch block, unspecified: Secondary | ICD-10-CM | POA: Insufficient documentation

## 2020-03-16 DIAGNOSIS — Z955 Presence of coronary angioplasty implant and graft: Secondary | ICD-10-CM | POA: Insufficient documentation

## 2020-03-16 DIAGNOSIS — G4733 Obstructive sleep apnea (adult) (pediatric): Secondary | ICD-10-CM | POA: Insufficient documentation

## 2020-03-16 DIAGNOSIS — E118 Type 2 diabetes mellitus with unspecified complications: Secondary | ICD-10-CM | POA: Insufficient documentation

## 2020-03-16 DIAGNOSIS — Z79899 Other long term (current) drug therapy: Secondary | ICD-10-CM | POA: Insufficient documentation

## 2020-03-16 DIAGNOSIS — Z7902 Long term (current) use of antithrombotics/antiplatelets: Secondary | ICD-10-CM | POA: Insufficient documentation

## 2020-03-16 DIAGNOSIS — G2581 Restless legs syndrome: Secondary | ICD-10-CM | POA: Insufficient documentation

## 2020-03-16 DIAGNOSIS — Z794 Long term (current) use of insulin: Secondary | ICD-10-CM | POA: Insufficient documentation

## 2020-03-17 ENCOUNTER — Other Ambulatory Visit: Payer: Self-pay | Admitting: Internal Medicine

## 2020-03-23 ENCOUNTER — Encounter: Payer: Self-pay | Admitting: Intensive Care

## 2020-03-23 ENCOUNTER — Emergency Department: Payer: Medicare Other

## 2020-03-23 ENCOUNTER — Other Ambulatory Visit: Payer: Self-pay

## 2020-03-23 ENCOUNTER — Encounter: Payer: Self-pay | Admitting: *Deleted

## 2020-03-23 ENCOUNTER — Emergency Department
Admission: EM | Admit: 2020-03-23 | Discharge: 2020-03-23 | Disposition: A | Discharge status: Home / Self Care | Payer: Medicare Other | Attending: Emergency Medicine | Admitting: Emergency Medicine

## 2020-03-23 DIAGNOSIS — I110 Hypertensive heart disease with heart failure: Secondary | ICD-10-CM | POA: Insufficient documentation

## 2020-03-23 DIAGNOSIS — Z20822 Contact with and (suspected) exposure to covid-19: Secondary | ICD-10-CM | POA: Diagnosis not present

## 2020-03-23 DIAGNOSIS — E119 Type 2 diabetes mellitus without complications: Secondary | ICD-10-CM | POA: Diagnosis not present

## 2020-03-23 DIAGNOSIS — Z794 Long term (current) use of insulin: Secondary | ICD-10-CM | POA: Insufficient documentation

## 2020-03-23 DIAGNOSIS — S40021A Contusion of right upper arm, initial encounter: Secondary | ICD-10-CM | POA: Diagnosis not present

## 2020-03-23 DIAGNOSIS — Z043 Encounter for examination and observation following other accident: Secondary | ICD-10-CM | POA: Diagnosis not present

## 2020-03-23 DIAGNOSIS — I2510 Atherosclerotic heart disease of native coronary artery without angina pectoris: Secondary | ICD-10-CM | POA: Insufficient documentation

## 2020-03-23 DIAGNOSIS — Z79899 Other long term (current) drug therapy: Secondary | ICD-10-CM | POA: Diagnosis not present

## 2020-03-23 DIAGNOSIS — Z96653 Presence of artificial knee joint, bilateral: Secondary | ICD-10-CM | POA: Diagnosis not present

## 2020-03-23 DIAGNOSIS — Y92002 Bathroom of unspecified non-institutional (private) residence single-family (private) house as the place of occurrence of the external cause: Secondary | ICD-10-CM | POA: Insufficient documentation

## 2020-03-23 DIAGNOSIS — E1142 Type 2 diabetes mellitus with diabetic polyneuropathy: Secondary | ICD-10-CM | POA: Diagnosis not present

## 2020-03-23 DIAGNOSIS — Y9389 Activity, other specified: Secondary | ICD-10-CM | POA: Insufficient documentation

## 2020-03-23 DIAGNOSIS — R531 Weakness: Secondary | ICD-10-CM | POA: Diagnosis not present

## 2020-03-23 DIAGNOSIS — R42 Dizziness and giddiness: Secondary | ICD-10-CM | POA: Diagnosis not present

## 2020-03-23 DIAGNOSIS — W19XXXA Unspecified fall, initial encounter: Secondary | ICD-10-CM | POA: Diagnosis not present

## 2020-03-23 DIAGNOSIS — Y999 Unspecified external cause status: Secondary | ICD-10-CM | POA: Diagnosis not present

## 2020-03-23 DIAGNOSIS — Z7982 Long term (current) use of aspirin: Secondary | ICD-10-CM | POA: Insufficient documentation

## 2020-03-23 DIAGNOSIS — I5089 Other heart failure: Secondary | ICD-10-CM | POA: Diagnosis not present

## 2020-03-23 DIAGNOSIS — S0003XA Contusion of scalp, initial encounter: Secondary | ICD-10-CM | POA: Diagnosis not present

## 2020-03-23 DIAGNOSIS — S0990XA Unspecified injury of head, initial encounter: Secondary | ICD-10-CM | POA: Diagnosis present

## 2020-03-23 DIAGNOSIS — I10 Essential (primary) hypertension: Secondary | ICD-10-CM | POA: Diagnosis not present

## 2020-03-23 DIAGNOSIS — Z955 Presence of coronary angioplasty implant and graft: Secondary | ICD-10-CM

## 2020-03-23 DIAGNOSIS — Z7902 Long term (current) use of antithrombotics/antiplatelets: Secondary | ICD-10-CM | POA: Diagnosis not present

## 2020-03-23 LAB — CBC
HCT: 33.8 % — ABNORMAL LOW (ref 36.0–46.0)
Hemoglobin: 10.2 g/dL — ABNORMAL LOW (ref 12.0–15.0)
MCH: 24.3 pg — ABNORMAL LOW (ref 26.0–34.0)
MCHC: 30.2 g/dL (ref 30.0–36.0)
MCV: 80.7 fL (ref 80.0–100.0)
Platelets: 378 10*3/uL (ref 150–400)
RBC: 4.19 MIL/uL (ref 3.87–5.11)
RDW: 17.5 % — ABNORMAL HIGH (ref 11.5–15.5)
WBC: 10.5 10*3/uL (ref 4.0–10.5)
nRBC: 0 % (ref 0.0–0.2)

## 2020-03-23 LAB — BASIC METABOLIC PANEL
Anion gap: 11 (ref 5–15)
BUN: 10 mg/dL (ref 8–23)
CO2: 26 mmol/L (ref 22–32)
Calcium: 8.9 mg/dL (ref 8.9–10.3)
Chloride: 103 mmol/L (ref 98–111)
Creatinine, Ser: 1.05 mg/dL — ABNORMAL HIGH (ref 0.44–1.00)
GFR calc Af Amer: >60 mL/min (ref >60)
GFR calc non Af Amer: 52 mL/min — ABNORMAL LOW (ref >60)
Glucose, Bld: 165 mg/dL — ABNORMAL HIGH (ref 70–99)
Potassium: 4.3 mmol/L (ref 3.5–5.1)
Sodium: 140 mmol/L (ref 135–145)

## 2020-03-23 LAB — SARS CORONAVIRUS 2 BY RT PCR (HOSPITAL ORDER, PERFORMED IN ~~LOC~~ HOSPITAL LAB): SARS Coronavirus 2: NEGATIVE

## 2020-03-23 MED ORDER — ACETAMINOPHEN 500 MG PO TABS
1000.0000 mg | ORAL_TABLET | Freq: Once | ORAL | Status: AC
  Administered 2020-03-23: 1000 mg via ORAL
  Filled 2020-03-23: qty 2

## 2020-03-23 NOTE — ED Notes (Signed)
Pt presents to ED following fall at home this morning. Pt states she bent over to pick something up and got dizzy and fell back backwards. Pt states she hit her head on cement shower chair. Pt denies LOC. Pt also c/o rt shoulder pain from hitting floor, pt states shoulder was replaced 2019. Pt AxO x 4 at bedside.

## 2020-03-23 NOTE — ED Triage Notes (Signed)
Pt comes into the ED via EMS from home, states she was in the shower and slipped and fell back hitting her head takes plavix,. Pt is a/ox3, 148/66, HR80, 100RA, CBG 162

## 2020-03-23 NOTE — Discharge Instructions (Signed)
Please continue your normal care at home and continue all of your prescription medications.  Use Tylenol for pain.  Up to 1000 mg per dose, up to 4 times per day.  Do not take more than 4000 mg of Tylenol/acetaminophen within 24 hours..  We swabbed you for COVID-19 before you left.  Please check your MyChart results for this test results later this evening.  If positive, you will need to quarantine and keep masks around other people until you test negative.  Return to the ED with any fevers, recurrent falls or episodes of passing out.

## 2020-03-23 NOTE — ED Provider Notes (Signed)
[facility] Emergency Department Provider Note ____________________________________________   First MD Initiated Contact with Patient 03/23/20 1318     (approximate)  I have reviewed the triage vital signs and the nursing notes.  HISTORY  Chief Complaint Fall and Dizziness   HPI Denise Sharp is a 74 y.o. femalewho presents to the ED for evaluation of headache and shoulder pain after a fall.   Chart review indicates hx HTN, HLD, DM on insulin, diastolic dysfunction, obesity and OSA.  Patient reports that her husband recently returned from vacation and she was replacing out of the bathroom.  She reports being bent over and replacing items beneath her bathroom sink for a few minutes, when she stood up straight quickly developed immediate lightheaded dizziness and sensation of presyncope.  She reports stumbling backwards and falling into her shower, causing occipital and right shoulder damage.  Patient denies syncope, vomiting or seizure-like activity.  She reports difficulty getting up from the floor, so called 911 for lift assistance.  They urged her to go to the ED for evaluation due to being on DAPT with Plavix.  Here in the ED, patient was reporting 7/10 intensity occipital headache that is constant, aching in nature and nonradiating.  She has not taken any medications prior to arrival.  She reports that she has not eaten or drank anything today, and not taken her morning medications.  She reports concern for her previous right shoulder reverse arthroplasty.  Denies recent illnesses.  She reports being ambulatory since the event.    Past Medical History:  Diagnosis Date  . Abnormality of gait 03/25/2013  . Adrenal mass, left ([place])   . Anemia    iron deficient post  2 unit txfsn 2009, normal endo/colonoscopy by [facility]  . Arthritis   . Atypical chest pain    a. 04/1986 Cath ([facility]): nl cors, EF 65%; b. 07/2012 Cath [facility]): LM nl, LAD/LCX/RCA w/ min irregs; c.  07/2016 MV [facility]): "Equivocal" w/ small inf wall rev defect and hyperdynamic LV contraction, EF 87%; d. 08/2016 Cardiac CT Ca2+ score [facility]): Ca2+ score 1548; e. 05/2019 MV: No ischemia. Diaph atten. EF 75%.  . Cervical spinal stenosis 1994   due to trauma to back ([employer] accident), has intermittent paralysis and parasthesias  . Cervicogenic headache 03/23/2014  . Depression   . Diastolic dysfunction    a. 12/2019 Echo: EF 65-70%, Gr2 DD. No significant valvular dzs.  . Dizziness    chronic dizziness  . DJD (degenerative joint disease)    a. Chronic R shoulder pain pending R shoulder replacement 07/2017.  [name] Esophageal stenosis March 2011   with transietn outlet obstruction by food, cleared by EGD   . Family history of adverse reaction to anesthesia    daughter PONV  . Gastric bypass status for obesity   . Headache(784.0)   . Hypertension   . IBS (irritable bowel syndrome)   . Left bundle branch block (LBBB)    a. Intermittently present - likely rate related. - patient denies  . Obesity   . Obstructive sleep apnea    using CPAP  . 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 Restless leg syndrome   . Rotator cuff arthropathy, right 08/13/2017  . Syncope and collapse 03/12/2014  . Type II diabetes mellitus [facility])     Patient Active Problem List   Diagnosis Date Noted  . Rhinitis 02/15/2020  . Chronic heart failure with preserved ejection fraction (HFpEF) ([place]) 01/14/2020  . Hyperlipidemia LDL goal <70  01/14/2020  . Coronary artery disease involving native coronary artery of native heart with unstable angina pectoris ([place]) 01/13/2020  . S/P cardiac catheterization 01/13/2020  . Angina at rest [facility]) 12/31/2019  . Dysphagia 11/30/2019  . Gastritis 08/07/2019  . Lab test negative for COVID-19 virus 08/07/2019  . S/P panniculectomy 06/16/2019  . Major depressive disorder with current active episode 05/30/2019  . Exposure to COVID-19 virus 12/25/2018  . Functional diarrhea 10/22/2018    . Bilateral cataracts 06/26/2018  . Status post bariatric surgery 02/05/2018  . GAD (generalized anxiety disorder) 02/01/2018  . Rectocele 01/24/2018  . Vaginal prolapse 01/04/2018  . Hepatic steatosis 01/04/2018  . Microalbuminuria due to type 2 diabetes mellitus ([place]) 11/21/2017  . Hospital discharge follow-up 09/22/2017  . Hypocalcemia 09/22/2017  . Hypotension 09/22/2017  . Fecal incontinence 09/22/2017  . B12 deficiency 09/22/2017  . Rotator cuff arthropathy, right 08/13/2017  . Primary localized osteoarthrosis of shoulder 08/13/2017  . Encounter for preoperative examination for general surgical procedure 08/03/2017  . 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 Candidiasis, intertrigo 08/03/2017  . 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 Edema 12/13/2015  . Hypersomnia, persistent 06/18/2015  . Mechanical breakdown of implanted electronic neurostimulator of peripheral nerve ([place]) 10/16/2014  . Obesity hypoventilation syndrome ([place]) 08/13/2014  . Frequent falls 06/23/2014  . Chronic cough 05/05/2014  . Adenoma of left adrenal gland 03/24/2014  . Syncope and collapse 03/12/2014  . Vitamin D deficiency 01/06/2014  . Weight gain following gastric bypass surgery 12/03/2013  . Morbid obesity ([place]) 08/25/2013  . Diaphragmatic hernia 04/06/2013  . Diabetic polyneuropathy ([place]) 03/25/2013  . Abnormality of gait 03/25/2013  . Multiple pulmonary nodules 03/20/2013  . Exertional dyspnea 03/20/2013  . Anemia 08/06/2012  . Iron deficiency anemia 08/06/2012  . Functional disorder of bladder 07/15/2012  . Incomplete bladder emptying 07/15/2012  . 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 Urge incontinence 07/15/2012  . Increased frequency of urination 07/15/2012  . OSA on CPAP 03/02/2012  . Insomnia secondary to anxiety 02/29/2012  . Sciatica 09/05/2011  . Cervical stenosis of spinal canal 06/14/2011  . Restless legs  syndrome 03/13/2011  . Degenerative disk disease 03/13/2011  . Essential hypertension 03/12/2011  . Uncontrolled type 2 diabetes with neuropathy ([place]) 03/12/2011  . Hearing loss, right 03/12/2011    Past Surgical History:  Procedure Laterality Date  . ABDOMINAL HYSTERECTOMY    . APPENDECTOMY    . COLONOSCOPY    . CORONARY STENT INTERVENTION N/A 01/04/2020   Procedure: CORONARY STENT INTERVENTION;  Surgeon: [name], MD;  Location: [place] [facility];  Service: Cardiovascular;  Laterality: N/A;  LAD   . DIAPHRAGMATIC HERNIA REPAIR  2015  . ESOPHAGEAL DILATION     multiple  . ESOPHAGOGASTRODUODENOSCOPY    . [place]  . GALLBLADDER SURGERY  resection  . GASTRIC BYPASS    . GASTRIC BYPASS  2000, 2005   Dr. Debroah Loop  . [facility] IMPLANT PLACEMENT  April 2013   [place]  . JOINT REPLACEMENT  2007   bilateral knee. [name],  [name]  . LEFT HEART CATH AND CORS/GRAFTS ANGIOGRAPHY N/A 12/31/2019   Procedure: LEFT HEART CATH AND CORS/GRAFTS ANGIOGRAPHY;  Surgeon: [name], MD;  Location: [place] [facility];  Service: Cardiovascular;  Laterality: N/A;  . PANNICULECTOMY  06/16/2019  . PANNICULECTOMY N/A 06/16/2019   Procedure: PANNICULECTOMY;  Surgeon: [name], MD;  Location: [place];  Service: Plastics;  Laterality: N/A;  3 hours, please  . REVERSE SHOULDER ARTHROPLASTY Right 08/13/2017   Procedure: REVERSE RIGHT SHOULDER ARTHROPLASTY;  Surgeon: [name], MD;  Location: [place];  Service: Orthopedics;  Laterality: Right;  . ROTATOR CUFF REPAIR     right  . [place]  . TOTAL ABDOMINAL HYSTERECTOMY W/ BILATERAL SALPINGOOPHORECTOMY  1974  . UMBILICAL HERNIA REPAIR  Aug 11, 2015    Prior to Admission medications   Medication Sig Start Date End Date Taking? Authorizing Provider  albuterol (VENTOLIN HFA) 108 (90 Base) MCG/ACT inhaler Inhale 2 puffs into the lungs every 8 (eight) hours as needed for wheezing or shortness of breath.  08/07/19   [name], MD  aspirin EC 81 MG tablet Take 1 tablet (81 mg total) by mouth daily. 01/05/20   Fritzi Mandes, MD  atorvastatin (LIPITOR) 40 MG tablet Take 1 tablet (40 mg total) by mouth daily. 03/07/20   Dunn, Areta Haber, PA-C  benzonatate (TESSALON) 200 MG capsule Take 1 capsule (200 mg total) by mouth 2 (two) times daily as needed for cough. 01/13/20   [name], MD  buPROPion (WELLBUTRIN) 75 MG tablet TAKE 2 TABLETS BY MOUTH EVERY MORNING AND 1 TABLET IN THE AFTERNOON 02/19/20   [name], MD  butalbital-acetaminophen-caffeine (FIORICET) 50-325-40 MG tablet Take 1 tablet by mouth every 6 (six) hours as needed for headache. 05/28/19   [name], MD  citalopram (CELEXA) 10 MG tablet Take 1 tablet (10 mg total) by mouth daily. 01/20/20   [name], MD  clopidogrel (PLAVIX) 75 MG tablet Take 1 tablet (75 mg total) by mouth daily. 03/07/20   Dunn, Areta Haber, PA-C  clotrimazole-betamethasone (LOTRISONE) cream Apply 1 application topically 2 (two) times daily. Patient taking differently: Apply 1 application topically 2 (two) times daily as needed (rash).  01/24/18   Gae Dry, MD  Continuous Blood Gluc Receiver (FREESTYLE LIBRE 14 DAY READER) DEVI 1 applicator by Does not apply route 4 (four) times daily. 10/02/19   [name], MD  Continuous Blood Gluc Sensor (FREESTYLE LIBRE 14 DAY SENSOR) MISC Use to test blood sugar 4 times daily 10/02/19   [name], MD  cyanocobalamin (,VITAMIN B-12,) 1000 MCG/ML injection Inject 1 ml (1000 mcg ) IM weekly x 4,  Then monthly thereafter Patient taking differently: Inject 1,000 mcg into the muscle every 30 (thirty) days. On the 12th day 09/25/18   [name], MD  dapagliflozin propanediol (FARXIGA) 5 MG TABS tablet Take 1 tablet (5 mg total) by mouth daily before breakfast. 03/10/20   [name], MD  diazepam (VALIUM) 5 MG tablet Take 1 tablet (5 mg total) by mouth at bedtime as needed for anxiety. 11/27/19   [name], MD  dicyclomine (BENTYL) 10 MG capsule Take 1 capsule (10 mg total) by mouth 4 (four) times daily -  before meals and at bedtime. 07/29/19   [name], MD  diphenhydrAMINE (BENADRYL) 25 MG tablet Take 50 mg by mouth daily as needed for allergies. Patient not taking: Reported on 03/15/2020    [provider]  diphenoxylate-atropine (LOMOTIL) 2.5-0.025 MG tablet Take 1 tablet by mouth 4 (four) times daily as needed for diarrhea or loose stools. Patient not taking: Reported on 03/15/2020 11/27/19   [name], MD  DROPLET PEN NEEDLES 31G X 5 MM MISC USE ONE NEEDLE SUBCUTANEOUSLY AS DIRECTED (REMOVE AND DISCARD NEEDLE IN SHARPS CONTAINER IMMEDIATELY  AFTER USE) 02/04/20   [name], MD  Eszopiclone 3 MG TABS Take 1 tablet (3 mg total) by mouth at bedtime. 11/27/19   [name], MD  hyoscyamine (ANASPAZ) 0.125 MG TBDP disintergrating tablet DISSOLVE UNDER TONGUE 15 MINUTES BEFORE MEALS 03/17/20   [name], MD  Insulin Glargine (LANTUS SOLOSTAR) 100 UNIT/ML Solostar Pen Inject 11 Units into the skin daily. Patient taking differently: Inject 10-15 Units into the skin daily.  03/31/19   [name], MD  ipratropium (ATROVENT) 0.06 % nasal spray Place 2 sprays into both nostrils 4 (four) times daily. 08/21/19   [name], MD  levETIRAcetam (KEPPRA) 750 MG tablet Take 1 tablet (750 mg total) by mouth at bedtime. Patient taking differently: Take 750 mg by mouth at bedtime as needed (restless legs).  08/08/18   Kathrynn Ducking, MD  neomycin-polymyxin-hydrocortisone (CORTISPORIN) OTIC solution Place 3 drops into both ears 4 (four) times daily. For otitis externa 02/15/20   [name], MD  nitroGLYCERIN (NITROSTAT) 0.4 MG SL tablet Place 1 tablet (0.4 mg total) under the tongue every 5 (five) minutes as needed for chest pain. Patient not taking: Reported on 03/15/2020 01/05/20   Fritzi Mandes, MD  NONFORMULARY OR COMPOUNDED [place]  Combination Pain Cream -    Baclofen 2%, Doxepin 5%, Gabapentin 6%, Topiramate 2%, Pentoxifylline 3% Apply 1-2 grams to affected area 3-4 times daily Qty. 120 gm 3 refills 10/14/17   Gardiner Barefoot, DPM  nystatin [facility]) powder Apply 1 g topically 4 (four) times daily as needed (rash).  Patient not taking: Reported on 03/15/2020    [provider]  ondansetron (ZOFRAN ODT) 4 MG disintegrating tablet Take 1 tablet (4 mg total) by mouth every 8 (eight) hours as needed for nausea or vomiting. Patient not taking: Reported on 03/15/2020 08/07/19   [name], MD  OVER THE COUNTER MEDICATION Apply 1 application topically daily as needed (pain). [place]    [provider]  pregabalin (LYRICA) 100 MG capsule Take 100 mg by mouth at bedtime as needed (leg cramps).     [provider]  traMADol (ULTRAM) 50 MG tablet Take 0.5 tablets (25 mg total) by mouth every 12 (twelve) hours as needed for severe pain. 08/21/17   Regalado, Belkys A, MD  umeclidinium-vilanterol (ANORO ELLIPTA) 62.5-25 MCG/INH AEPB Inhale 1 puff into the lungs daily. Patient not taking: Reported on 03/15/2020 02/24/19   Deneise Lever, MD    Allergies Demeclocycline, Erythromycin, Flagyl [metronidazole], Glucophage [metformin hcl], Tetracyclines & related, Diovan [valsartan], Sulfa antibiotics, and Xanax [alprazolam]  Family History  Problem Relation Age of Onset  . Heart disease Father   . Hypertension Father   . Prostate cancer Father   . Stroke Father   . Osteoporosis Father   . Stroke Mother   . Depression Mother   . Headache Mother   . Heart disease Mother   . Thyroid disease Mother   . Hypertension Mother   . Diabetes Daughter   . Heart disease Daughter   . Hypertension Daughter   . Hypertension Son     Social History Social History   Tobacco Use  . Smoking status: Never Smoker  . Smokeless tobacco: Never Used  Vaping Use  . Vaping Use: Never used  Substance Use Topics  . Alcohol use: No  . Drug use: No     Review of Systems  Constitutional: No fever/chills Eyes: No visual changes. ENT: No sore throat.  Cardiovascular: Denies chest pain. Respiratory: Denies shortness of breath. Gastrointestinal: No abdominal pain.  No nausea, no vomiting.  No diarrhea.  No constipation. Genitourinary: Negative for dysuria. Musculoskeletal: Negative for back pain.  Positive for occipital pain and right shoulder pain. Skin: Negative for rash. Neurological: Negative for  focal weakness or numbness.  Positive for headache.  ____________________________________________   PHYSICAL EXAM:  VITAL SIGNS: Vitals:   03/23/20 1136 03/23/20 1630  BP: (!) 142/56 (!) 141/60  Pulse: 71 68  Resp: 16 16  Temp: 98.2 F (36.8 C)   SpO2: 99% 98%      Constitutional: Alert and oriented. Well appearing and in no acute distress. Eyes: Conjunctivae are normal. PERRL. EOMI. Head: Right occipital hematoma without discrete laceration or evidence of bleeding.  No associated bony step-offs. Nose: No congestion/rhinnorhea. Mouth/Throat: Mucous membranes are moist.  Oropharynx non-erythematous. Neck: No stridor. No cervical spine tenderness to palpation. Cardiovascular: Normal rate, regular rhythm. Grossly normal heart sounds.  Good peripheral circulation. Respiratory: Normal respiratory effort.  No retractions. Lungs CTAB. Gastrointestinal: Soft , nondistended, nontender to palpation. No abdominal bruits. No CVA tenderness. Musculoskeletal: No lower extremity tenderness.  No joint effusions.  Right shoulder with large lateral bruise, about 12 inches in length running longitudinally over her deltoid and about 3 inches wide.  This area is diffusely and mildly tender to palpation.  She has active intact and passive ROM to her right shoulder, and RUE is distally neurovascularly intact without additional signs of trauma. No spinal step-offs or signs of trauma to the back. Logrolling of bilateral lower extremities without  pain. Neurologic:  Normal speech and language. No gross focal neurologic deficits are appreciated. No gait instability noted. Cranial nerves II through XII intact 5/5 strength and sensation in all 4 extremities Skin:  Skin is warm, dry and intact. No rash noted. Psychiatric: Mood and affect are normal. Speech and behavior are normal.  ____________________________________________   LABS (all labs ordered are listed, but only abnormal results are displayed)  Labs Reviewed  BASIC METABOLIC PANEL - Abnormal; Notable for the following components:      Result Value   Glucose, Bld 165 (*)    Creatinine, Ser 1.05 (*)    GFR calc non Af Amer 52 (*)    All other components within normal limits  CBC - Abnormal; Notable for the following components:   Hemoglobin 10.2 (*)    HCT 33.8 (*)    MCH 24.3 (*)    RDW 17.5 (*)    All other components within normal limits  SARS CORONAVIRUS 2 BY RT PCR (HOSPITAL ORDER, [place] [facility])  URINALYSIS, COMPLETE (UACMP) WITH MICROSCOPIC  CBG MONITORING, ED   ____________________________________________  12 Lead EKG  Sinus rhythm, rate of 64 bpm.  Normal axis and intervals.  No evidence of acute ischemia. ____________________________________________  RADIOLOGY  ED MD interpretation: CT head and x-ray of shoulder reviewed without evidence of acute intracranial pathology or acute fracture/dislocation, respectively.  Official radiology report(s): DG Shoulder Right  Result Date: 03/23/2020 CLINICAL DATA:  Fall EXAM: RIGHT SHOULDER - 2+ VIEW COMPARISON:  None. FINDINGS: Reverse shoulder arthroplasty. No evidence of fracture or dislocation IMPRESSION: . 1.  No fracture or dislocation. 2. 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   On: 03/23/2020 14:49   CT HEAD WO CONTRAST  Result Date: 03/23/2020 CLINICAL DATA:  Dizziness, fall. EXAM: CT HEAD WITHOUT CONTRAST TECHNIQUE:  Contiguous axial images were obtained  from the base of the skull through the vertex without intravenous contrast. COMPARISON:  August 19, 2017. FINDINGS: Brain: Mild chronic ischemic white matter disease is noted. No mass effect or midline shift is noted. Ventricular size is within normal limits. There is no evidence of mass lesion, hemorrhage or acute infarction. Vascular: No hyperdense vessel or unexpected calcification. Skull: Normal. Negative for fracture or focal lesion. Sinuses/Orbits: No acute finding. Other: Small right posterior scalp hematoma is noted. IMPRESSION: Mild chronic ischemic white matter disease. Small right posterior scalp hematoma. No acute intracranial abnormality seen. Electronically Signed   By: Marijo Conception M.D.   On: 03/23/2020 13:35    ____________________________________________   PROCEDURES and INTERVENTIONS  Procedure(s) performed (including Critical Care):  Procedures  Medications  acetaminophen (TYLENOL) tablet 1,000 mg (1,000 mg Oral Given 03/23/20 1409)   ____________________________________________   MDM / ED COURSE  74 year old woman on DAPT presents for evaluation after a fall backwards this morning, likely a vasovagal episode, without evidence of significant injury and amenable to outpatient management.  Normal vital signs on room air.  Exam with superficial bruising throughout her right lateral shoulder, and small occipital hematoma, but otherwise without evidence of acute traumatic pathology.  Patient is in no acute distress and looks well.  No signs of neurovascular deficits.  Due to patient being on DAPT, CT head obtained and without evidence of ICH, and only redemonstrates her scalp hematoma.  X-ray of right shoulder does not demonstrate fracture or hardware malalignment with her reverse arthroplasty.  Patient reports no p.o. intake today prior to this dizziness episode and falling backwards.  In the setting of this history, as well as her rapidly  standing up after kneeling for a while to replace items in a low cabinet, her symptoms are most consistent with a vasovagal episode precipitating a fall backwards.  She denies any associated syncope or seizure-like activity.  Patient subsequently tolerates p.o. intake here in the ED with improving symptoms.  Pain well controlled with single dose of Tylenol.  Husband arrives and indicates that she is at her baseline and has no further concerns.  Educated patient on outpatient management of the significant bruising to her right shoulder and what to expect with the bruise settling with gravity and persisting likely for a matter of couple weeks.  We discussed return precautions for the ED and patient is medically stable for discharge home.  Clinical Course as of Mar 24 2027  Wed Mar 23, 2020  1554 Re-assessed. Patient reports feeling well.  Tolerated p.o. intake without complication, abdominal pain, nausea or vomiting.  Husband at the bedside confirms patient's history.  He reports that she looks a lot better.  They further report that they were recently in [place] to visit the [facility] and had exposure to many people without masks and are concerned about Covid.  They are requesting a Covid test for the patient before she leaves.   [DS]    Clinical Course User Index [DS] Vladimir Crofts, MD    ____________________________________________   FINAL CLINICAL IMPRESSION(S) / ED DIAGNOSES  Final diagnoses:  Fall, initial encounter  Lightheadedness  Scalp hematoma, initial encounter  Superficial bruising of arm, right, initial encounter     ED Discharge Orders    None       Maceo Hernan Tamala Julian   Note:  This document was prepared using Dragon voice recognition software and may include unintentional dictation errors.   Vladimir Crofts, MD 03/23/20 2033

## 2020-03-23 NOTE — ED Notes (Signed)
Pt does not have dentures with her given apple sauce to eat at this time

## 2020-03-23 NOTE — Progress Notes (Signed)
Cardiac Individual Treatment Plan  Patient Details  Name: Denise Sharp MRN: [id number] Date of Birth: 1945-12-20 Referring Provider:     Cardiac Rehab from 01/25/2020 in [facility] Cardiac and Pulmonary Rehab  Referring Provider Arida (End)      Initial Encounter Date:    Cardiac Rehab from 01/25/2020 in [facility] Cardiac and Pulmonary Rehab  Date 01/25/20      Visit Diagnosis: Status post coronary artery stent placement  Patient's Home Medications on Admission:  Current Outpatient Medications:  .  albuterol (VENTOLIN HFA) 108 (90 Base) MCG/ACT inhaler, Inhale 2 puffs into the lungs every 8 (eight) hours as needed for wheezing or shortness of breath., Disp: 8 g, Rfl: 2 .  aspirin EC 81 MG tablet, Take 1 tablet (81 mg total) by mouth daily., Disp: 30 tablet, Rfl: 2 .  atorvastatin (LIPITOR) 40 MG tablet, Take 1 tablet (40 mg total) by mouth daily., Disp: 90 tablet, Rfl: 0 .  benzonatate (TESSALON) 200 MG capsule, Take 1 capsule (200 mg total) by mouth 2 (two) times daily as needed for cough., Disp: 30 capsule, Rfl: 5 .  buPROPion (WELLBUTRIN) 75 MG tablet, TAKE 2 TABLETS BY MOUTH EVERY MORNING AND 1 TABLET IN THE AFTERNOON, Disp: 270 tablet, Rfl: 1 .  butalbital-acetaminophen-caffeine (FIORICET) 50-325-40 MG tablet, Take 1 tablet by mouth every 6 (six) hours as needed for headache., Disp: 90 tablet, Rfl: 5 .  citalopram (CELEXA) 10 MG tablet, Take 1 tablet (10 mg total) by mouth daily., Disp: 90 tablet, Rfl: 1 .  clopidogrel (PLAVIX) 75 MG tablet, Take 1 tablet (75 mg total) by mouth daily., Disp: 90 tablet, Rfl: 0 .  clotrimazole-betamethasone (LOTRISONE) cream, Apply 1 application topically 2 (two) times daily. (Patient taking differently: Apply 1 application topically 2 (two) times daily as needed (rash). ), Disp: 45 g, Rfl: 3 .  Continuous Blood Gluc Receiver (FREESTYLE LIBRE 14 DAY READER) DEVI, 1 applicator by Does not apply route 4 (four) times daily., Disp: 1 each, Rfl: 6 .  Continuous  Blood Gluc Sensor (FREESTYLE LIBRE 14 DAY SENSOR) MISC, Use to test blood sugar 4 times daily, Disp: 1 each, Rfl: 6 .  cyanocobalamin (,VITAMIN B-12,) 1000 MCG/ML injection, Inject 1 ml (1000 mcg ) IM weekly x 4,  Then monthly thereafter (Patient taking differently: Inject 1,000 mcg into the muscle every 30 (thirty) days. On the 12th day), Disp: 110 mL, Rfl: 0 .  dapagliflozin propanediol (FARXIGA) 5 MG TABS tablet, Take 1 tablet (5 mg total) by mouth daily before breakfast., Disp: 90 tablet, Rfl: 0 .  diazepam (VALIUM) 5 MG tablet, Take 1 tablet (5 mg total) by mouth at bedtime as needed for anxiety., Disp: 30 tablet, Rfl: 5 .  dicyclomine (BENTYL) 10 MG capsule, Take 1 capsule (10 mg total) by mouth 4 (four) times daily -  before meals and at bedtime., Disp: 360 capsule, Rfl: 3 .  diphenhydrAMINE (BENADRYL) 25 MG tablet, Take 50 mg by mouth daily as needed for allergies. (Patient not taking: Reported on 03/15/2020), Disp: , Rfl:  .  diphenoxylate-atropine (LOMOTIL) 2.5-0.025 MG tablet, Take 1 tablet by mouth 4 (four) times daily as needed for diarrhea or loose stools. (Patient not taking: Reported on 03/15/2020), Disp: 60 tablet, Rfl: 0 .  DROPLET PEN NEEDLES 31G X 5 MM MISC, USE ONE NEEDLE SUBCUTANEOUSLY AS DIRECTED (REMOVE AND DISCARD NEEDLE IN SHARPS CONTAINER IMMEDIATELY AFTER USE), Disp: 100 each, Rfl: 1 .  Eszopiclone 3 MG TABS, Take 1 tablet (3 mg total) by  mouth at bedtime., Disp: 90 tablet, Rfl: 1 .  hyoscyamine (ANASPAZ) 0.125 MG TBDP disintergrating tablet, DISSOLVE UNDER TONGUE 15 MINUTES BEFORE MEALS, Disp: 60 tablet, Rfl: 0 .  Insulin Glargine (LANTUS SOLOSTAR) 100 UNIT/ML Solostar Pen, Inject 11 Units into the skin daily. (Patient taking differently: Inject 10-15 Units into the skin daily. ), Disp: 5 pen, Rfl: 3 .  ipratropium (ATROVENT) 0.06 % nasal spray, Place 2 sprays into both nostrils 4 (four) times daily., Disp: 15 mL, Rfl: 12 .  levETIRAcetam (KEPPRA) 750 MG tablet, Take 1 tablet  (750 mg total) by mouth at bedtime. (Patient taking differently: Take 750 mg by mouth at bedtime as needed (restless legs). ), Disp: 90 tablet, Rfl: 3 .  neomycin-polymyxin-hydrocortisone (CORTISPORIN) OTIC solution, Place 3 drops into both ears 4 (four) times daily. For otitis externa, Disp: 10 mL, Rfl: 0 .  nitroGLYCERIN (NITROSTAT) 0.4 MG SL tablet, Place 1 tablet (0.4 mg total) under the tongue every 5 (five) minutes as needed for chest pain. (Patient not taking: Reported on 03/15/2020), Disp: 20 tablet, Rfl: 1 .  NONFORMULARY OR COMPOUNDED ITEM, [facility]  Combination Pain Cream -  Baclofen 2%, Doxepin 5%, Gabapentin 6%, Topiramate 2%, Pentoxifylline 3% Apply 1-2 grams to affected area 3-4 times daily Qty. 120 gm 3 refills, Disp: 1 each, Rfl: 3 .  nystatin (NYAMYC) powder, Apply 1 g topically 4 (four) times daily as needed (rash).  (Patient not taking: Reported on 03/15/2020), Disp: , Rfl:  .  ondansetron (ZOFRAN ODT) 4 MG disintegrating tablet, Take 1 tablet (4 mg total) by mouth every 8 (eight) hours as needed for nausea or vomiting. (Patient not taking: Reported on 03/15/2020), Disp: 20 tablet, Rfl: 0 .  OVER THE COUNTER MEDICATION, Apply 1 application topically daily as needed (pain). [place] Gell, Disp: , Rfl:  .  pregabalin (LYRICA) 100 MG capsule, Take 100 mg by mouth at bedtime as needed (leg cramps). , Disp: , Rfl:  .  traMADol (ULTRAM) 50 MG tablet, Take 0.5 tablets (25 mg total) by mouth every 12 (twelve) hours as needed for severe pain., Disp: 30 tablet, Rfl: 0 .  umeclidinium-vilanterol (ANORO ELLIPTA) 62.5-25 MCG/INH AEPB, Inhale 1 puff into the lungs daily. (Patient not taking: Reported on 03/15/2020), Disp: 1 each, Rfl: 12  Past Medical History: Past Medical History:  Diagnosis Date  . Abnormality of gait 03/25/2013  . Adrenal mass, left ([place])   . Anemia    iron deficient post  2 unit txfsn 2009, normal endo/colonoscopy by [facility]  . Arthritis   . Atypical chest pain    a.  04/1986 Cath ([facility]): nl cors, EF 65%; b. 07/2012 Cath [facility] LM nl, LAD/LCX/RCA w/ min irregs; c. 07/2016 MV [facility]): "Equivocal" w/ small inf wall rev defect and hyperdynamic LV contraction, EF 87%; d. 08/2016 Cardiac CT Ca2+ score [facility]): Ca2+ score 1548; e. 05/2019 MV: No ischemia. Diaph atten. EF 75%.  . Cervical spinal stenosis 1994   due to trauma to back ([employer] accident), has intermittent paralysis and parasthesias  . Cervicogenic headache 03/23/2014  . Depression   . Diastolic dysfunction    a. 12/2019 Echo: EF 65-70%, Gr2 DD. No significant valvular dzs.  . Dizziness    chronic dizziness  . DJD (degenerative joint disease)    a. Chronic R shoulder pain pending R shoulder replacement 07/2017.  [name] Esophageal stenosis March 2011   with transietn outlet obstruction by food, cleared by EGD   . Family history of adverse reaction to anesthesia  daughter PONV  . Gastric bypass status for obesity   . Headache(784.0)   . Hypertension   . IBS (irritable bowel syndrome)   . Left bundle branch block (LBBB)    a. Intermittently present - likely rate related. - patient denies  . Obesity   . Obstructive sleep apnea    using CPAP  . 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 Restless leg syndrome   . Rotator cuff arthropathy, right 08/13/2017  . Syncope and collapse 03/12/2014  . Type II diabetes mellitus (HCC)     Tobacco Use: Social History   Tobacco Use  Smoking Status Never Smoker  Smokeless Tobacco Never Used    Labs: Recent Review Flowsheet Data    Labs for ITP Cardiac and Pulmonary Rehab Latest Ref Rng & Units 08/14/2018 03/06/2019 06/09/2019 12/31/2019 01/25/2020   Cholestrol 0 - 200 mg/dL - - - 183 -   LDLCALC 0 - 99 mg/dL - - - 113(H) -   LDLDIRECT mg/dL - - - - -   HDL >40 mg/dL - - - 42 -   Trlycerides <150 mg/dL - - - 140 -   Hemoglobin A1c 4.8 - 5.6 % 6.5 7.4(H) 7.2(H) 7.7(H) 7.6(H)   TCO2 22 - 32 mmol/L - - - - -       Exercise Target Goals: Exercise Program  Goal: Individual exercise prescription set using results from initial 6 min walk test and THRR while considering  patient's activity barriers and safety.   Exercise Prescription Goal: Initial exercise prescription builds to 30-45 minutes a day of aerobic activity, 2-3 days per week.  Home exercise guidelines will be given to patient during program as part of exercise prescription that the participant will acknowledge.   Education: Aerobic Exercise & Resistance Training: - Gives group verbal and written instruction on the various components of exercise. Focuses on aerobic and resistive training programs and the benefits of this training and how to safely progress through these programs..   Cardiac Rehab from 03/09/2020 in [facility] Cardiac and Pulmonary Rehab  Date 03/09/20  Educator AS  Instruction Review Code 1- Verbalizes Understanding      Education: Exercise & Equipment Safety: - Individual verbal instruction and demonstration of equipment use and safety with use of the equipment.   Cardiac Rehab from 03/09/2020 in [facility] Cardiac and Pulmonary Rehab  Date 01/25/20  Educator AS  Instruction Review Code 1- Verbalizes Understanding      Education: Exercise Physiology & General Exercise Guidelines: - Group verbal and written instruction with models to review the exercise physiology of the cardiovascular system and associated critical values. Provides general exercise guidelines with specific guidelines to those with heart or lung disease.    Cardiac Rehab from 03/09/2020 in [facility] Cardiac and Pulmonary Rehab  Date 01/27/20  Educator AS  Instruction Review Code 1- Verbalizes Understanding      Education: Flexibility, Balance, Mind/Body Relaxation: Provides group verbal/written instruction on the benefits of flexibility and balance training, including mind/body exercise modes such as yoga, pilates and tai chi.  Demonstration and skill practice provided.   Activity Barriers & Risk  Stratification:  Activity Barriers & Cardiac Risk Stratification - 01/15/20 1411      Activity Barriers & Cardiac Risk Stratification   Activity Barriers Arthritis;Back Problems;Muscular Weakness;Neck/Spine Problems;Left Knee Replacement;Right Knee Replacement;Balance Concerns;Assistive Device;Joint Problems    Cardiac Risk Stratification High           6 Minute Walk:  6 Minute Walk    Row Name 01/25/20  1438         6 Minute Walk   Phase Initial     Distance 400 feet     Walk Time 5.5 minutes     # of Rest Breaks 0     MPH 0.75     METS 0.5     RPE 15     Perceived Dyspnea  2     VO2 Peak 1.81     Symptoms Yes (comment)     Comments R hip/buttock 3/10     Resting HR 73 bpm     Resting BP 108/68     Resting Oxygen Saturation  98 %     Exercise Oxygen Saturation  during 6 min walk 97 %     Max Ex. HR 102 bpm     Max Ex. BP 146/60     2 Minute Post BP 130/68            Oxygen Initial Assessment:   Oxygen Re-Evaluation:   Oxygen Discharge (Final Oxygen Re-Evaluation):   Initial Exercise Prescription:  Initial Exercise Prescription - 01/25/20 1400      Date of Initial Exercise RX and Referring Provider   Date 01/25/20    Referring Provider Arida (End)      Recumbant Bike   Level 1    RPM 60    Minutes 15      NuStep   Level 1    SPM 80    Minutes 15      Arm Ergometer   Level 1    RPM 25    Minutes 15      Prescription Details   Frequency (times per week) 3    Duration Progress to 30 minutes of continuous aerobic without signs/symptoms of physical distress      Intensity   THRR 40-80% of Max Heartrate 102-131    Ratings of Perceived Exertion 11-15    Perceived Dyspnea 0-4      Resistance Training   Training Prescription Yes    Weight 3 lb    Reps 10-15           Perform Capillary Blood Glucose checks as needed.  Exercise Prescription Changes:  Exercise Prescription Changes    Row Name 01/25/20 1400 02/09/20 1500 02/23/20 1500  03/09/20 1300       Response to Exercise   Blood Pressure (Admit) 108/68 122/70 126/70 122/82    Blood Pressure (Exercise) 146/66 130/68 132/68 158/80    Blood Pressure (Exit) 130/68 122/60 118/70 138/80    Heart Rate (Admit) 73 bpm 92 bpm 88 bpm 91 bpm    Heart Rate (Exercise) 102 bpm 99 bpm 126 bpm 85 bpm    Heart Rate (Exit) 74 bpm 90 bpm 86 bpm 75 bpm    Oxygen Saturation (Admit) 98 % -- -- --    Oxygen Saturation (Exercise) 97 % -- -- --    Rating of Perceived Exertion (Exercise) '15 12 17 11    ' Perceived Dyspnea (Exercise) 2 -- -- --    Symptoms R hip buttock 3/10 unable to do bike with hip -- none    Duration -- Progress to 30 minutes of  aerobic without signs/symptoms of physical distress Progress to 30 minutes of  aerobic without signs/symptoms of physical distress Continue with 30 min of aerobic exercise without signs/symptoms of physical distress.    Intensity -- THRR unchanged THRR unchanged THRR unchanged      Progression   Progression -- Continue to  progress workloads to maintain intensity without signs/symptoms of physical distress. Continue to progress workloads to maintain intensity without signs/symptoms of physical distress. Continue to progress workloads to maintain intensity without signs/symptoms of physical distress.    Average METs -- 1.8 2.5 1.6      Resistance Training   Training Prescription -- Yes Yes Yes    Weight -- 3 lb 3 lb 3 lb    Reps -- 10-15 10-15 10-15      Interval Training   Interval Training -- No No No      Recumbant Bike   Level -- -- 1 --    RPM -- -- 60 --    Minutes -- -- 15 --      NuStep   Level -- '1 4 4    ' SPM -- -- 80 --    Minutes -- '15 15 30    ' METs -- 1.8 2.2 1.6      Arm Ergometer   Level -- 1 -- --    Minutes -- 15 -- --    METs -- 1.8 -- --      REL-XR   Level -- -- -- 1    Minutes -- -- -- 30           Exercise Comments:   Exercise Goals and Review:  Exercise Goals    [place] Name 01/25/20 1454              Exercise Goals   Increase Physical Activity Yes       Intervention Provide advice, education, support and counseling about physical activity/exercise needs.;Develop an individualized exercise prescription for aerobic and resistive training based on initial evaluation findings, risk stratification, comorbidities and participant's personal goals.       Expected Outcomes Short Term: Attend rehab on a regular basis to increase amount of physical activity.;Long Term: Add in home exercise to make exercise part of routine and to increase amount of physical activity.;Long Term: Exercising regularly at least 3-5 days a week.       Increase Strength and Stamina Yes       Intervention Provide advice, education, support and counseling about physical activity/exercise needs.;Develop an individualized exercise prescription for aerobic and resistive training based on initial evaluation findings, risk stratification, comorbidities and participant's personal goals.       Expected Outcomes Short Term: Increase workloads from initial exercise prescription for resistance, speed, and METs.;Short Term: Perform resistance training exercises routinely during rehab and add in resistance training at home;Long Term: Improve cardiorespiratory fitness, muscular endurance and strength as measured by increased METs and functional capacity (6MWT)       Able to understand and use rate of perceived exertion (RPE) scale Yes       Intervention Provide education and explanation on how to use RPE scale       Expected Outcomes Short Term: Able to use RPE daily in rehab to express subjective intensity level;Long Term:  Able to use RPE to guide intensity level when exercising independently       Knowledge and understanding of Target Heart Rate Range (THRR) Yes       Intervention Provide education and explanation of THRR including how the numbers were predicted and where they are located for reference       Expected Outcomes Short Term:  Able to state/look up THRR;Short Term: Able to use daily as guideline for intensity in rehab;Long Term: Able to use THRR to govern intensity when exercising independently  Able to check pulse independently Yes       Intervention Provide education and demonstration on how to check pulse in carotid and radial arteries.;Review the importance of being able to check your own pulse for safety during independent exercise       Expected Outcomes Short Term: Able to explain why pulse checking is important during independent exercise;Long Term: Able to check pulse independently and accurately       Understanding of Exercise Prescription Yes       Intervention Provide education, explanation, and written materials on patient's individual exercise prescription       Expected Outcomes Short Term: Able to explain program exercise prescription;Long Term: Able to explain home exercise prescription to exercise independently              Exercise Goals Re-Evaluation :  Exercise Goals Re-Evaluation    Row Name 01/27/20 1421 02/09/20 1511 02/23/20 1522 03/07/20 1424 03/09/20 1354     Exercise Goal Re-Evaluation   Exercise Goals Review Increase Physical Activity;Able to understand and use rate of perceived exertion (RPE) scale;Knowledge and understanding of Target Heart Rate Range (THRR);Understanding of Exercise Prescription;Increase Strength and Stamina;Able to check pulse independently Increase Physical Activity;Increase Strength and Stamina;Understanding of Exercise Prescription Increase Physical Activity;Increase Strength and Stamina;Understanding of Exercise Prescription Increase Physical Activity;Increase Strength and Stamina;Understanding of Exercise Prescription Increase Physical Activity;Increase Strength and Stamina;Understanding of Exercise Prescription   Comments Reviewed RPE and dyspnea scales, THR and program prescription with pt today.  Pt voiced understanding and was given a copy of goals to take  home. Kristene is off to a good start in rehab.  She is now up to 1.8 METs on the arm crank.  She was unable to do the bike, so we have rearranged her equipment to what she can use.  We will continue to monitor her progress. Kanitra tolerates exercise well and is able to work in the correct THR range.  Staff will monitor progress. Dayshia has only been able to walk outside program sessions.  She is very tired most days. Lakasha has been doing well in rehab. She is on level 2 for the T5.  She continues to shuffle her gait with short steps and not open to trying anything else to help.  We will continue to monitor her progress.   Expected Outcomes Short: Use RPE daily to regulate intensity. Long: Follow program prescription in THR. Short: Continue to attend regularly Long: Continue to improve stamina. Short: Continue to attend regularly Long: Continue to improve stamina. Short: talk with Dr about energy level Long:  improve stamina and breathing Short: Continue to work hard in rehab.  Long: Improve stamina          Discharge Exercise Prescription (Final Exercise Prescription Changes):  Exercise Prescription Changes - 03/09/20 1300      Response to Exercise   Blood Pressure (Admit) 122/82    Blood Pressure (Exercise) 158/80    Blood Pressure (Exit) 138/80    Heart Rate (Admit) 91 bpm    Heart Rate (Exercise) 85 bpm    Heart Rate (Exit) 75 bpm    Rating of Perceived Exertion (Exercise) 11    Symptoms none    Duration Continue with 30 min of aerobic exercise without signs/symptoms of physical distress.    Intensity THRR unchanged      Progression   Progression Continue to progress workloads to maintain intensity without signs/symptoms of physical distress.    Average METs 1.6  Resistance Training   Training Prescription Yes    Weight 3 lb    Reps 10-15      Interval Training   Interval Training No      NuStep   Level 4    Minutes 30    METs 1.6      REL-XR   Level 1    Minutes 30            Nutrition:  Target Goals: Understanding of nutrition guidelines, daily intake of sodium <1524m, cholesterol <2063m calories 30% from fat and 7% or less from saturated fats, daily to have 5 or more servings of fruits and vegetables.  Education: Controlling Sodium/Reading Food Labels -Group verbal and written material supporting the discussion of sodium use in heart healthy nutrition. Review and explanation with models, verbal and written materials for utilization of the food label.   Education: General Nutrition Guidelines/Fats and Fiber: -Group instruction provided by verbal, written material, models and posters to present the general guidelines for heart healthy nutrition. Gives an explanation and review of dietary fats and fiber.   Biometrics:  Pre Biometrics - 01/25/20 1455      Pre Biometrics   Height '5\' 5"'  (1.651 m)    Weight 241 lb 3.2 oz (109.4 kg)    BMI (Calculated) 40.14            Nutrition Therapy Plan and Nutrition Goals:  Nutrition Therapy & Goals - 02/29/20 1643      Nutrition Therapy   Diet soft/liquid    Protein (specify units) 90g    Fiber 25 grams    Whole Grain Foods 3 servings    Saturated Fats 12 max. grams    Fruits and Vegetables 5 servings/day    Sodium 1.5 grams      Personal Nutrition Goals   Nutrition Goal ST: small frequent meals of liquids and soft foods. LT: Leighanna: increase energy and cannot perform ADLs like folding laundry - 3 years 3-6 months on bed rest each year. Build muscle.    Comments Pt primary diagnosis for Cardiac Rehab is S/P coronary artery stent placement. 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 Pt also presents with medulloadrenal hyperfunction, OSA on CPAP, dysphagia, and hearing loss. Pt has many conditions which could affect her mobility and ADLs including siatica, degenerative disc disease, arthropathy, and osteoarthrosis. Per family medicine pt has  started a KETO diet enforced by daughter. Her husband,Tony, picks up food a lot. LiPrincettaeports that they don't eat much so they don't cook much and she is lethargic she feels like she cant even defrost broccoli (suggested frozen vegetables that are easy to prepare if restaurants do not have any). 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 LiSherleeports getting nauseous and having reflux even with a bite of food and even sometimes saliva; encouraged pt to talk to her MD regarding this as less than 500kcal/day for 7-8 months can lead to malnutrition and other health issues - if she cannot do the surgery earlier maybe MD can provide tube feeds that bypasses the esophagus. Encouraged soft and liquid foods; pt reports not liking supplements - suggested at home protein shakes with nut butter and greek yogurt, proats (oats with egg whites and nut butter), mashed vegetables and beans, meatloaf, fish, steamed vegetables - pt was not receptive. Pt  reports wanting a meal plan - will provide some meal ideas again, but expect with her lethargy and disinterest with suggestions this may not be helpful. Amberley reports having IBS and having 50-75 bowel movements per day even when NPO at the hospital as well as having a transit time so fast that after eating a salad, fully formed lettuce will be in her stool. Suspect that due to the little amount that she is eating her bowel movements are not as frequent, but frequent enough to be problematic. Suspect that if her transit time was that fast constantly for her whole life she would be malnourished and stuggle to maintain weight as well as meet her needs. Discussed heart healhty/ diabetes friendly eating - focused MNT for IBS and dysphagia.      Intervention Plan   Intervention Prescribe, educate and counsel regarding individualized specific dietary modifications aiming towards targeted core  components such as weight, hypertension, lipid management, diabetes, heart failure and other comorbidities.;Nutrition handout(s) given to patient.    Expected Outcomes Short Term Goal: Understand basic principles of dietary content, such as calories, fat, sodium, cholesterol and nutrients.;Short Term Goal: A plan has been developed with personal nutrition goals set during dietitian appointment.;Long Term Goal: Adherence to prescribed nutrition plan.           Nutrition Assessments:  Nutrition Assessments - 01/25/20 1500      Rate Your Plate Scores   Pre Score 41           MEDIFICTS Score Key:          ?70 Need to make dietary changes          40-70 Heart Healthy Diet         ? 40 Therapeutic Level Cholesterol Diet  Nutrition Goals Re-Evaluation:   Nutrition Goals Discharge (Final Nutrition Goals Re-Evaluation):   Psychosocial: Target Goals: Acknowledge presence or absence of significant depression and/or stress, maximize coping skills, provide positive support system. Participant is able to verbalize types and ability to use techniques and skills needed for reducing stress and depression.   Education: Depression - Provides group verbal and written instruction on the correlation between heart/lung disease and depressed mood, treatment options, and the stigmas associated with seeking treatment.   Cardiac Rehab from 03/09/2020 in [facility] Cardiac and Pulmonary Rehab  Date 02/17/20  Educator SB  Instruction Review Code 1- [employer] Understanding      Education: Sleep Hygiene -Provides group verbal and written instruction about how sleep can affect your health.  Define sleep hygiene, discuss sleep cycles and impact of sleep habits. Review good sleep hygiene tips.     Education: Stress and Anxiety: - Provides group verbal and written instruction about the health risks of elevated stress and causes of high stress.  Discuss the correlation between heart/lung disease and anxiety and  treatment options. Review healthy ways to manage with stress and anxiety.   Cardiac Rehab from 03/09/2020 in [facility] Cardiac and Pulmonary Rehab  Date 02/17/20  Educator SB  Instruction Review Code 1- Verbalizes Understanding       Initial Review & Psychosocial Screening:  Initial Psych Review & Screening - 01/15/20 1414      Initial Review   Current issues with Current Stress Concerns    Source of Stress Concerns Chronic Illness      Family Dynamics   Good Support System? Yes   husband, daughter     Barriers   Psychosocial barriers to participate in program There are no identifiable  barriers or psychosocial needs.;The patient should benefit from training in stress management and relaxation.      Screening Interventions   Interventions Encouraged to exercise;To provide support and resources with identified psychosocial needs;Provide feedback about the scores to participant    Expected Outcomes Short Term goal: Utilizing psychosocial counselor, staff and physician to assist with identification of specific Stressors or current issues interfering with healing process. Setting desired goal for each stressor or current issue identified.;Long Term Goal: Stressors or current issues are controlled or eliminated.;Short Term goal: Identification and review with participant of any Quality of Life or Depression concerns found by scoring the questionnaire.;Long Term goal: The participant improves quality of Life and PHQ9 Scores as seen by post scores and/or verbalization of changes           Quality of Life Scores:   Quality of Life - 01/25/20 1500      Quality of Life   Select Quality of Life      Quality of Life Scores   Health/Function Pre 14.47 %    Socioeconomic Pre 18.25 %    Psych/Spiritual Pre 23.43 %    Family Pre 26.4 %    GLOBAL Pre 18.86 %          Scores of 19 and below usually indicate a poorer quality of life in these areas.  A difference of  2-3 points is a clinically  meaningful difference.  A difference of 2-3 points in the total score of the Quality of Life Index has been associated with significant improvement in overall quality of life, self-image, physical symptoms, and general health in studies assessing change in quality of life.  PHQ-9: Recent Review Flowsheet Data    Depression screen [facility] 2/9 02/24/2020 02/17/2020 01/25/2020 01/13/2020 06/24/2019   Decreased Interest 1 1 0 0 0   Down, Depressed, Hopeless 0 0 0 0 0   PHQ - 2 Score 1 1 0 0 0   Altered sleeping '1 2 2  ' - -   Tired, decreased energy '2 3 3  ' - -   Change in appetite 2 2 0 - -   Feeling bad or failure about yourself  1 0 0 - -   Trouble concentrating 0 2 0 - -   Moving slowly or fidgety/restless 0 0 3 - -   Suicidal thoughts 0 0 0 - -   PHQ-9 Score '7 10 8 ' - -   Difficult doing work/chores Somewhat difficult Somewhat difficult Extremely dIfficult - -     Interpretation of Total Score  Total Score Depression Severity:  1-4 = Minimal depression, 5-9 = Mild depression, 10-14 = Moderate depression, 15-19 = Moderately severe depression, 20-27 = Severe depression   Psychosocial Evaluation and Intervention:  Psychosocial Evaluation - 01/15/20 1415      Psychosocial Evaluation & Interventions   Interventions Encouraged to exercise with the program and follow exercise prescription    Comments Jadea has a long history of health issues. She is curious to see what she can do during cardiac rehab given her history of mobility issues. She wants to improve her stamina and balance. She reports a good support system of her husband and her daughter.    Expected Outcomes Short: attend cardiac rehab for exercise and education. Long: develop positive self care habits.    Continue Psychosocial Services  Follow up required by staff           Psychosocial Re-Evaluation:  Psychosocial Re-Evaluation    Row Name  02/17/20 1509 03/07/20 1421           Psychosocial Re-Evaluation   Current issues with  Current Sleep Concerns;Current Stress Concerns Current Sleep Concerns;Current Stress Concerns      Comments Kynesha states she feels a little bit better since being able to get out and about. She still struggles with chronic insomnia. Her PHQ was reviewed by staff and will be repeated as it is still high. Aubreyanna has been sleepng better according to her Fitbit - between 6-8 hours a night.  She still feels tired and has no energy most days.  Staff recommended she talk with Dr about tiredness as well.      Expected Outcomes Short: attend cardiac rehab for educaion, exercise and something to do out of the house. Long: find a positive self care habit. Short:  continue to attend Cardiac Rehab and sleep well Long:  maintain good sleep      Continue Psychosocial Services  Follow up required by staff --             Psychosocial Discharge (Final Psychosocial Re-Evaluation):  Psychosocial Re-Evaluation - 03/07/20 1421      Psychosocial Re-Evaluation   Current issues with Current Sleep Concerns;Current Stress Concerns    Comments Ashantae has been sleepng better according to her Fitbit - between 6-8 hours a night.  She still feels tired and has no energy most days.  Staff recommended she talk with Dr about tiredness as well.    Expected Outcomes Short:  continue to attend Cardiac Rehab and sleep well Long:  maintain good sleep           Vocational Rehabilitation: Provide vocational rehab assistance to qualifying candidates.   Vocational Rehab Evaluation & Intervention:  Vocational Rehab - 01/15/20 1414      Initial Vocational Rehab Evaluation & Intervention   Assessment shows need for Vocational Rehabilitation No           Education: Education Goals: Education classes will be provided on a variety of topics geared toward better understanding of heart health and risk factor modification. Participant will state understanding/return demonstration of topics presented as noted by education test  scores.  Learning Barriers/Preferences:  Learning Barriers/Preferences - 01/15/20 1414      Learning Barriers/Preferences   Learning Barriers None    Learning Preferences None           General Cardiac Education Topics:  AED/CPR: - Group verbal and written instruction with the use of models to demonstrate the basic use of the AED with the basic ABC's of resuscitation.   Anatomy & Physiology of the Heart: - Group verbal and written instruction and models provide basic cardiac anatomy and physiology, with the coronary electrical and arterial systems. Review of Valvular disease and Heart Failure   Cardiac Procedures: - Group verbal and written instruction to review commonly prescribed medications for heart disease. Reviews the medication, class of the drug, and side effects. Includes the steps to properly store meds and maintain the prescription regimen. (beta blockers and nitrates)   Cardiac Rehab from 03/09/2020 in [facility] Cardiac and Pulmonary Rehab  Date 03/09/20  Educator [facility]  Instruction Review Code 1- Verbalizes Understanding      Cardiac Medications I: - Group verbal and written instruction to review commonly prescribed medications for heart disease. Reviews the medication, class of the drug, and side effects. Includes the steps to properly store meds and maintain the prescription regimen.   Cardiac Medications II: -Group verbal and written instruction  to review commonly prescribed medications for heart disease. Reviews the medication, class of the drug, and side effects. (all other drug classes)   Cardiac Rehab from 03/09/2020 in [facility] Cardiac and Pulmonary Rehab  Date 02/24/20  Educator SB  Instruction Review Code 1- Verbalizes Understanding       Go Sex-Intimacy & Heart Disease, Get SMART - Goal Setting: - Group verbal and written instruction through game format to discuss heart disease and the return to sexual intimacy. Provides group verbal and written material to  discuss and apply goal setting through the application of the S.M.A.R.T. Method.   Cardiac Rehab from 03/09/2020 in [facility] Cardiac and Pulmonary Rehab  Date 03/09/20  Educator [facility]  Instruction Review Code 1- Verbalizes Understanding      Other Matters of the Heart: - Provides group verbal, written materials and models to describe Stable Angina and Peripheral Artery. Includes description of the disease process and treatment options available to the cardiac patient.   Infection Prevention: - Provides verbal and written material to individual with discussion of infection control including proper hand washing and proper equipment cleaning during exercise session.   Cardiac Rehab from 03/09/2020 in [facility] Cardiac and Pulmonary Rehab  Date 01/25/20  Educator AS  Instruction Review Code 1- Verbalizes Understanding      Falls Prevention: - Provides verbal and written material to individual with discussion of falls prevention and safety.   Cardiac Rehab from 03/09/2020 in [facility] Cardiac and Pulmonary Rehab  Date 01/25/20  Educator AS  Instruction Review Code 1- Verbalizes Understanding      Other: -Provides group and verbal instruction on various topics (see comments)   Knowledge Questionnaire Score:  Knowledge Questionnaire Score - 01/25/20 1500      Knowledge Questionnaire Score   Pre Score 21/26           Core Components/Risk Factors/Patient Goals at Admission:  Personal Goals and Risk Factors at Admission - 01/25/20 1504      Core Components/Risk Factors/Patient Goals on Admission    Weight Management Yes    Intervention Weight Management: Develop a combined nutrition and exercise program designed to reach desired caloric intake, while maintaining appropriate intake of nutrient and fiber, sodium and fats, and appropriate energy expenditure required for the weight goal.;Weight Management: Provide education and appropriate resources to help participant work on and attain dietary  goals.;Weight Management/Obesity: Establish reasonable short term and long term weight goals.    Admit Weight 241 lb 3.2 oz (109.4 kg)    Goal Weight: Short Term 235 lb (106.6 kg)    Goal Weight: Long Term 220 lb (99.8 kg)    Expected Outcomes Short Term: Continue to assess and modify interventions until short term weight is achieved;Long Term: Adherence to nutrition and physical activity/exercise program aimed toward attainment of established weight goal;Weight Loss: Understanding of general recommendations for a balanced deficit meal plan, which promotes 1-2 lb weight loss per week and includes a negative energy balance of [phone] kcal/d;Understanding recommendations for meals to include 15-35% energy as protein, 25-35% energy from fat, 35-60% energy from carbohydrates, less than 253m of dietary cholesterol, 20-35 gm of total fiber daily;Understanding of distribution of calorie intake throughout the day with the consumption of 4-5 meals/snacks    Intervention Provide education about signs/symptoms and action to take for hypo/hyperglycemia.;Provide education about proper nutrition, including hydration, and aerobic/resistive exercise prescription along with prescribed medications to achieve blood glucose in normal ranges: Fasting glucose 65-99 mg/dL    Expected Outcomes Short Term:  Participant verbalizes understanding of the signs/symptoms and immediate care of hyper/hypoglycemia, proper foot care and importance of medication, aerobic/resistive exercise and nutrition plan for blood glucose control.;Long Term: Attainment of HbA1C < 7%.    Intervention Provide education on lifestyle modifcations including regular physical activity/exercise, weight management, moderate sodium restriction and increased consumption of fresh fruit, vegetables, and low fat dairy, alcohol moderation, and smoking cessation.;Monitor prescription use compliance.    Expected Outcomes Short Term: Continued assessment and intervention  until BP is < 140/53m HG in hypertensive participants. < 130/863mHG in hypertensive participants with diabetes, heart failure or chronic kidney disease.;Long Term: Maintenance of blood pressure at goal levels.           Education:Diabetes - Individual verbal and written instruction to review signs/symptoms of diabetes, desired ranges of glucose level fasting, after meals and with exercise. Acknowledge that pre and post exercise glucose checks will be done for 3 sessions at entry of program.   [facility]  Date 01/15[facility]20  [facility] Code 1- [employer]derstanding      Education: Know Your Numbers and Risk Factors: -Group verbal and written instruction about important numbers in your health.  Discussion of what are risk factors and how they play a role in the disease process.  Review of Cholesterol, Blood Pressure, Diabetes, and BMI and the role they play in your overall health.   [facility]  Date 02/24/20  Educator SB  Instruction Review Code 1- Verbalizes Understanding      Core Components/Risk Factors/Patient Goals Review:   Goals and Risk Factor Review    Row Name 02/17/20 1454 03/07/20 1418           Core Components/Risk Factors/Patient Goals Review   Personal Goals Review Diabetes;Weight Management/Obesity;Hypertension Diabetes;Weight Management/Obesity;Hypertension      Review Education given on new glucose monitor. Diabetic safety reviewed with patient. 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    LiLoviniaas been checking BG at home it has been out of range at times.  She has appt with Dr TuDerrel Niphursday  about this.      Expected Outcomes Short: keep log of blood sugar. 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    LiAnaleaas been checking BG at home it has been out of range at times.  She has appt with Dr TuDerrel Niphursday  about this.    Expected Outcomes Short: follow up with Dr about BG  Long: manage risk factors and other health problems           ITP Comments:  ITP Comments    Row Name 01/15/20 1430 01/25/20 1507 01/27/20 1054 01/27/20 1419 02/24/20 0641   ITP Comments Initial telephone orientation completed. [facility] EP orientation scheduled for 7/12 at 1pm Completed 6MWT  and gym orientation. Initial ITP created and sent for review to Dr. Emily Filbert, Medical Director. 30 Day review completed. Medical Director ITP review done, changes made as directed, and signed approval by Medical Director. First full day of exercise!  Patient was oriented to gym and equipment including functions, settings, policies, and procedures.  Patient's individual exercise prescription and treatment plan were reviewed.  All starting workloads were established based on the results of the 6 minute walk test done at initial orientation visit.  The plan for exercise progression was also introduced and progression will be customized based on patient's performance and goals. 30 Day review completed. Medical Director ITP review done, changes made as directed, and signed approval by Medical Director.   [place] Name 03/23/20 1625           ITP Comments 30 day review completed. ITP sent to Dr. Emily Filbert, Medical Director of Cardiac and Pulmonary Rehab. Continue with ITP unless changes are made  by physician.              Comments: 30 day review

## 2020-03-23 NOTE — ED Triage Notes (Signed)
Patient arrived by EMS from home for fall. Reports she started feeling dizzy and stumbling backwards causing her to fall into her shower. Reports hitting head. Denies LOC. C/o left shoulder pain where she has hx of surgery

## 2020-03-24 ENCOUNTER — Telehealth: Payer: Self-pay | Admitting: Internal Medicine

## 2020-03-24 ENCOUNTER — Telehealth: Payer: Self-pay

## 2020-03-24 ENCOUNTER — Other Ambulatory Visit: Payer: Self-pay | Admitting: Internal Medicine

## 2020-03-24 MED ORDER — DIPHENOXYLATE-ATROPINE 2.5-0.025 MG PO TABS: 1.0000 | ORAL_TABLET | Freq: Four times a day (QID) | ORAL | 5 refills | Status: DC | PRN

## 2020-03-24 NOTE — Telephone Encounter (Signed)
Denise Sharp fell yesterday and went to ER.  They recommended she rest her shoulder and she plans to return Monday.  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   Denise Sharp has had several Dr appointments and has missed sessions here.

## 2020-03-24 NOTE — Telephone Encounter (Signed)
The Medication she requested  is a controlled substance, cannot be sent to [facility] .[name]

## 2020-03-24 NOTE — Telephone Encounter (Signed)
Patient went ED because she fell hit her head had x-rays she is drizzy and hurt her shoudler ,but need refill on diphenoxylate-atropine (LOMOTIL) 2.5-0.025 MG tablet  Need 6 refills also need it sent to [facility]

## 2020-03-25 NOTE — Telephone Encounter (Signed)
Left message letting pt know that medication has been sent in and that it had to be sent to her local pharmacy because it is a controlled substance.

## 2020-03-28 ENCOUNTER — Encounter: Payer: Medicare Other | Admitting: *Deleted

## 2020-03-28 DIAGNOSIS — G2581 Restless legs syndrome: Secondary | ICD-10-CM | POA: Diagnosis not present

## 2020-03-28 DIAGNOSIS — E118 Type 2 diabetes mellitus with unspecified complications: Secondary | ICD-10-CM | POA: Diagnosis not present

## 2020-03-28 DIAGNOSIS — Z79899 Other long term (current) drug therapy: Secondary | ICD-10-CM | POA: Diagnosis not present

## 2020-03-28 DIAGNOSIS — Z794 Long term (current) use of insulin: Secondary | ICD-10-CM | POA: Diagnosis not present

## 2020-03-28 DIAGNOSIS — Z955 Presence of coronary angioplasty implant and graft: Secondary | ICD-10-CM

## 2020-03-28 DIAGNOSIS — Z7982 Long term (current) use of aspirin: Secondary | ICD-10-CM | POA: Diagnosis not present

## 2020-03-28 DIAGNOSIS — I447 Left bundle-branch block, unspecified: Secondary | ICD-10-CM | POA: Diagnosis not present

## 2020-03-28 DIAGNOSIS — I10 Essential (primary) hypertension: Secondary | ICD-10-CM | POA: Diagnosis not present

## 2020-03-28 DIAGNOSIS — G4733 Obstructive sleep apnea (adult) (pediatric): Secondary | ICD-10-CM | POA: Diagnosis not present

## 2020-03-28 DIAGNOSIS — Z7902 Long term (current) use of antithrombotics/antiplatelets: Secondary | ICD-10-CM | POA: Diagnosis not present

## 2020-03-28 DIAGNOSIS — F329 Major depressive disorder, single episode, unspecified: Secondary | ICD-10-CM | POA: Diagnosis not present

## 2020-03-28 NOTE — Progress Notes (Signed)
Daily Session Note  Patient Details  Name: Denise Sharp MRN: [id number] Date of Birth: 05-Aug-1945 Referring Provider:     Cardiac Rehab from 01/25/2020 in [facility] Cardiac and Pulmonary Rehab  Referring Provider Arida (End)      Encounter Date: 03/28/2020  Check In:  Session Check In - 03/28/20 1428      Check-In   Supervising physician immediately available to respond to emergencies See telemetry face sheet for immediately available ER MD    Location [facility]-Cardiac & Pulmonary Rehab    Staff Present Renita Papa, RN Margurite Auerbach, MS Exercise Physiologist;Kelly Amedeo Plenty, BS, ACSM CEP, Exercise Physiologist    Virtual Visit No    Medication changes reported     No    Fall or balance concerns reported    No    Warm-up and Cool-down Performed on first and last piece of equipment    Resistance Training Performed Yes    VAD Patient? No    PAD/SET Patient? No      Pain Assessment   Currently in Pain? No/denies              Social History   Tobacco Use  Smoking Status Never Smoker  Smokeless Tobacco Never Used    Goals Met:  Independence with exercise equipment Exercise tolerated well No report of cardiac concerns or symptoms Strength training completed today  Goals Unmet:  Not Applicable  Comments: Pt able to follow exercise prescription today without complaint.  Will continue to monitor for progression.    Dr. Emily Filbert is Medical Director for Media and [facility].

## 2020-04-07 ENCOUNTER — Telehealth: Payer: Self-pay | Admitting: Internal Medicine

## 2020-04-07 ENCOUNTER — Encounter: Payer: Self-pay | Admitting: *Deleted

## 2020-04-07 DIAGNOSIS — Z955 Presence of coronary angioplasty implant and graft: Secondary | ICD-10-CM

## 2020-04-07 NOTE — Telephone Encounter (Signed)
Patient fell last Wednesday, she went to the ED. Now she is having problems getting out of chair, can not sit, or walk more then a few steps. Patient is requesting a x-ray.

## 2020-04-07 NOTE — Telephone Encounter (Signed)
The only  Imaging I see done was of head on Sept 9.  Which is 2 weeks ago.  She will need to go have her lumbar spine and pelvis x rayed  She can go to [facility]

## 2020-04-07 NOTE — Telephone Encounter (Signed)
I called and advised patient to go to [facility] walk-in. Patient stated that her husband just had cataract surgery & will go tomorrow when walk-in opens. I asked that she call back if anything else is needed.

## 2020-04-07 NOTE — Telephone Encounter (Signed)
I called patient to triage more in detail. I LM to call back. You have no available appointments would you be willing to order or send patient to [facility] that has xray capabilities?

## 2020-04-08 DIAGNOSIS — M533 Sacrococcygeal disorders, not elsewhere classified: Secondary | ICD-10-CM | POA: Diagnosis not present

## 2020-04-08 NOTE — Telephone Encounter (Signed)
FYI

## 2020-04-08 NOTE — Telephone Encounter (Signed)
Pt went to [facility] today and wants to let PCP know that tailbone is not fractured and that they are going to give her medication to alleviate the pain and help her sleep. FYI

## 2020-04-10 ENCOUNTER — Other Ambulatory Visit: Payer: Self-pay | Admitting: Internal Medicine

## 2020-04-13 ENCOUNTER — Telehealth: Payer: Self-pay | Admitting: Internal Medicine

## 2020-04-13 NOTE — Telephone Encounter (Signed)
Lft vm for pt to call ofc to sch DG barium swallow.

## 2020-04-18 DIAGNOSIS — E113393 Type 2 diabetes mellitus with moderate nonproliferative diabetic retinopathy without macular edema, bilateral: Secondary | ICD-10-CM | POA: Diagnosis not present

## 2020-04-19 ENCOUNTER — Ambulatory Visit (INDEPENDENT_AMBULATORY_CARE_PROVIDER_SITE_OTHER): Payer: Medicare Other | Admitting: Pharmacist

## 2020-04-19 ENCOUNTER — Telehealth: Payer: Medicare Other

## 2020-04-19 DIAGNOSIS — I25110 Atherosclerotic heart disease of native coronary artery with unstable angina pectoris: Secondary | ICD-10-CM

## 2020-04-19 DIAGNOSIS — E1165 Type 2 diabetes mellitus with hyperglycemia: Secondary | ICD-10-CM | POA: Diagnosis not present

## 2020-04-19 DIAGNOSIS — E1140 Type 2 diabetes mellitus with diabetic neuropathy, unspecified: Secondary | ICD-10-CM

## 2020-04-19 DIAGNOSIS — R1310 Dysphagia, unspecified: Secondary | ICD-10-CM

## 2020-04-19 DIAGNOSIS — IMO0002 Reserved for concepts with insufficient information to code with codable children: Secondary | ICD-10-CM

## 2020-04-19 NOTE — Chronic Care Management (AMB) (Signed)
Chronic Care Management   Follow Up Note   04/19/2020 Name: Denise Sharp MRN: [id number] DOB: 1945-10-17  Referred by: [name], MD Reason for referral : Chronic Care Management (Medication Management)   Denise Sharp is a 74 y.o. year old female who is a primary care patient of Tullo, Aris Everts, MD. The CCM team was consulted for assistance with chronic disease management and care coordination needs.    Contacted patient for medication management review.   Review of patient status, including review of consultants reports, relevant laboratory and other test results, and collaboration with appropriate care team members and the patient's provider was performed as part of comprehensive patient evaluation and provision of chronic care management services.    SDOH (Social Determinants of Health) assessments performed: No See Care Plan activities for detailed interventions related to [facility])     Outpatient Encounter Medications as of 04/19/2020  Medication Sig Note  . albuterol (VENTOLIN HFA) 108 (90 Base) MCG/ACT inhaler Inhale 2 puffs into the lungs every 8 (eight) hours as needed for wheezing or shortness of breath.   [name] aspirin EC 81 MG tablet Take 1 tablet (81 mg total) by mouth daily.   [name] atorvastatin (LIPITOR) 40 MG tablet Take 1 tablet (40 mg total) by mouth daily.   . benzonatate (TESSALON) 200 MG capsule Take 1 capsule (200 mg total) by mouth 2 (two) times daily as needed for cough.   [name] buPROPion (WELLBUTRIN) 75 MG tablet TAKE 2 TABLETS BY MOUTH EVERY MORNING AND 1 TABLET IN THE AFTERNOON   . butalbital-acetaminophen-caffeine (FIORICET) 50-325-40 MG tablet Take 1 tablet by mouth every 6 (six) hours as needed for headache.   . citalopram (CELEXA) 10 MG tablet Take 1 tablet (10 mg total) by mouth daily.   . clopidogrel (PLAVIX) 75 MG tablet Take 1 tablet (75 mg total) by mouth daily.   . clotrimazole-betamethasone (LOTRISONE) cream Apply 1 application topically 2 (two)  times daily. (Patient taking differently: Apply 1 application topically 2 (two) times daily as needed (rash). )   . Continuous Blood Gluc Receiver (FREESTYLE LIBRE 14 DAY READER) DEVI 1 applicator by Does not apply route 4 (four) times daily.   . Continuous Blood Gluc Sensor (FREESTYLE LIBRE 14 DAY SENSOR) MISC Use to test blood sugar 4 times daily   . cyanocobalamin (,VITAMIN B-12,) 1000 MCG/ML injection Inject 1 ml (1000 mcg ) IM weekly x 4,  Then monthly thereafter (Patient taking differently: Inject 1,000 mcg into the muscle every 30 (thirty) days. On the 12th day)   . dapagliflozin propanediol (FARXIGA) 5 MG TABS tablet Take 1 tablet (5 mg total) by mouth daily before breakfast.   . diazepam (VALIUM) 5 MG tablet Take 1 tablet (5 mg total) by mouth at bedtime as needed for anxiety.   . dicyclomine (BENTYL) 10 MG capsule Take 1 capsule (10 mg total) by mouth 4 (four) times daily -  before meals and at bedtime. 12/31/2019: Pt takes differently: take 1 capsule 4 times a day as needed.  . diphenhydrAMINE (BENADRYL) 25 MG tablet Take 50 mg by mouth daily as needed for allergies.    . diphenoxylate-atropine (LOMOTIL) 2.5-0.025 MG tablet Take 1 tablet by mouth 4 (four) times daily as needed for diarrhea or loose stools.   . DROPLET PEN NEEDLES 31G X 5 MM MISC USE ONE NEEDLE SUBCUTANEOUSLY AS DIRECTED (REMOVE AND DISCARD NEEDLE IN SHARPS CONTAINER IMMEDIATELY AFTER USE)   . Eszopiclone 3 MG TABS Take 1 tablet (3  mg total) by mouth at bedtime.   . hyoscyamine (ANASPAZ) 0.125 MG TBDP disintergrating tablet DISSOLVE UNDER TONGUE 15 MINUTES BEFORE MEALS   . Insulin Glargine (LANTUS SOLOSTAR) 100 UNIT/ML Solostar Pen Inject 11 Units into the skin daily. (Patient taking differently: Inject 10-15 Units into the skin daily. )   . ipratropium (ATROVENT) 0.06 % nasal spray Place 2 sprays into both nostrils 4 (four) times daily. 12/31/2019: Pt takes differently: Place 2 sprays into each nostril 4 times daily as needed.    . levETIRAcetam (KEPPRA) 750 MG tablet Take 1 tablet (750 mg total) by mouth at bedtime. (Patient taking differently: Take 750 mg by mouth at bedtime as needed (restless legs). ) 12/31/2019: Pt takes differently: Take 1 tablet at bedtime as needed.  . pregabalin (LYRICA) 100 MG capsule Take 100 mg by mouth at bedtime as needed (leg cramps).    . traMADol (ULTRAM) 50 MG tablet Take 0.5 tablets (25 mg total) by mouth every 12 (twelve) hours as needed for severe pain. 12/31/2019: Pt takes differently: Take 1 tablet (50 mg) every 12 hours as needed for severe pain.  [name] umeclidinium-vilanterol (ANORO ELLIPTA) 62.5-25 MCG/INH AEPB Inhale 1 puff into the lungs daily.   [name] neomycin-polymyxin-hydrocortisone (CORTISPORIN) OTIC solution Place 3 drops into both ears 4 (four) times daily. For otitis externa   . nitroGLYCERIN (NITROSTAT) 0.4 MG SL tablet Place 1 tablet (0.4 mg total) under the tongue every 5 (five) minutes as needed for chest pain. (Patient not taking: Reported on 03/15/2020)   . NONFORMULARY OR COMPOUNDED [place]  Combination Pain Cream -  Baclofen 2%, Doxepin 5%, Gabapentin 6%, Topiramate 2%, Pentoxifylline 3% Apply 1-2 grams to affected area 3-4 times daily Qty. 120 gm 3 refills 12/31/2019: Pt takes differently: Apply 3-4 times daily as needed.  . nystatin [facility]) powder Apply 1 g topically 4 (four) times daily as needed (rash).  (Patient not taking: Reported on 03/15/2020)   . ondansetron (ZOFRAN ODT) 4 MG disintegrating tablet Take 1 tablet (4 mg total) by mouth every 8 (eight) hours as needed for nausea or vomiting. (Patient not taking: Reported on 03/15/2020)   . OVER THE COUNTER MEDICATION Apply 1 application topically daily as needed (pain). [place]    No facility-administered encounter medications on file as of 04/19/2020.     Objective:   Goals Addressed              This Visit's Progress     Patient Stated   .  PharmD "I want to control my blood sugars better"  (pt-stated)        CARE PLAN ENTRY (see longitudinal plan of care for additional care plan information)  Current Barriers:  . Social, financial, community barriers:  o Fatigue s/p cardiac surgery. Reports that she thought she broke her tailbone because of pain, however, x-ray showed no concern.  o Appt tomorrow w/ spine specialist. Holding cardiac rehab right now. o Reports concern of a metallic taste in her mouth since cardiac surgery o Reports that her esophagus needs to be stretched, but this cannot occur w/o holding clopidogrel, which cardiology does not want to do for the first 6 months. Patient reports she chokes on food at least twice daily.  o Asks about FOBT that [name] had requested she pick up (01/26/20 note).  . Diabetes: uncontrolled; complicated by chronic medical conditions including recent angina s/p stent placement x2, s/p gastric bypass, HFpEF, HTN, depression, most recent A1c 7.5% . Most recent eGFR: ~  49 mL/min . Current antihyperglycemic regimen: Lantus 8 units daily, Farxiga 5 mg daily (x about 2 weeks) o Patient is extremely concerned in fluctuations in glucose readings since cardiac surgery. Notes that she may have been eating more carbohydrates due to esophagus concerns rather than proteins, which may be cause post-meal elevations . Current meal patterns: o Variable. If no appointments, wakes up at 10-10:30 am. Eats ~11 am for breakfast/lunch. Takes all medications together in the morning before eating.  o Supper: eats ~6-9 pm, depending upon pattern . Current blood glucose readings: utilizing FreeStyle Libre CGM; Reports fastings will generally be well controlled ~110-120s, but readings to >200 later in the day Date of Review: 04/19/20 Average Glucose: 144 mg/dL Time in Goal: (target 70-180) Sensor Usage: 48% - Time in range 70-180: 83% - Time above range: 17% - Time below range: 0% . Cardiovascular risk reduction: o Current hypertensive regimen: n/a o Current  hyperlipidemia regimen: atorvastatin 40 mg daily o Current antiplatelet regimen: ASA 81 mg daily, clopidogrel 75 mg daily . Eye exam: due . Nephropathy screening: up to date . Foot Exam: due . Depression/Anxiety: citalopram 10 mg daily, bupropion 150 mg QAM, 75 mg QPM, diazepam 5 mg QPM, Lunesta 3 mg QPM . GI (s/p gastric bypass, chronic diarrhea): dicyclomine 10 mg QID PRN, hyoscyamine 10 mg QID PRN . "Leg cramps": pregabalin 100 mg QPM PRN, levetiracetam 500 mg QPM PRN . COPD: Anoro daily PRN, albuterol HFA PRN  . Allergies/chronic cough: ipratropium nasal PRN,   Pharmacist Clinical Goal(s):  [name] Over the next 90 days, patient will work with PharmD and primary care provider to address optimized medication management  Interventions: . Comprehensive medication review performed, medication list updated in electronic medical record . Inter-disciplinary care team collaboration (see longitudinal plan of care) . Reviewed normal glucose readings, and appropriateness of more relaxed glucose goals given medical complexity. Given controlled fastings, will avoid increasing Lantus. Continue Farxiga, consider dose increase at next appt.  . Discussed need to scan CGM at least QID (sensor usage only 48%) to pick up all readings. Patient verbalizes understanding . Discussed keeping a food diary to determine what types of meals result in post prandial readings in 200s. Can match this up with CGM downloads at future appt to determine next medication steps. Patient verbalizes understanding . Reviewed medication list w/ literature of medications that can cause a metallic taste. None of patient's medications are associated with a metallic taste. . Will collaborate w/ clinical staff to determine next steps for patient picking up FOBT  Patient Self Care Activities:  . Patient will scan glucose at least QID , document, and provide at future appointments . Patient will take medications as prescribed . Patient will  report any questions or concerns to provider  . Patient will start keeping a food diary to determine what types of meals cause significant sugar elevations  Please see past updates related to this goal by clicking on the "Past Updates" button in the selected goal          Plan:  - Scheduled f/u face to face for CGM download in ~5 weeks  Catie Darnelle Maffucci, PharmD, [place], CPP Clinical Pharmacist [place] [phone]

## 2020-04-19 NOTE — Patient Instructions (Signed)
Visit Information  Goals Addressed              This Visit's Progress     Patient Stated   .  PharmD "I want to control my blood sugars better" (pt-stated)        CARE PLAN ENTRY (see longitudinal plan of care for additional care plan information)  Current Barriers:  . Social, financial, community barriers:  o Fatigue s/p cardiac surgery. Reports that she thought she broke her tailbone because of pain, however, x-ray showed no concern.  o Appt tomorrow w/ spine specialist. Holding cardiac rehab right now. o Reports concern of a metallic taste in her mouth since cardiac surgery o Reports that her esophagus needs to be stretched, but this cannot occur w/o holding clopidogrel, which cardiology does not want to do for the first 6 months. Patient reports she chokes on food at least twice daily.  o Asks about FOBT that [name] had requested she pick up (01/26/20 note).  . Diabetes: uncontrolled; complicated by chronic medical conditions including recent angina s/p stent placement x2, s/p gastric bypass, HFpEF, HTN, depression, most recent A1c 7.5% . Most recent eGFR: ~49 mL/min . Current antihyperglycemic regimen: Lantus 8 units daily, Farxiga 5 mg daily (x about 2 weeks) o Patient is extremely concerned in fluctuations in glucose readings since cardiac surgery. Notes that she may have been eating more carbohydrates due to esophagus concerns rather than proteins, which may be cause post-meal elevations . Current meal patterns: o Variable. If no appointments, wakes up at 10-10:30 am. Eats ~11 am for breakfast/lunch. Takes all medications together in the morning before eating.  o Supper: eats ~6-9 pm, depending upon pattern . Current blood glucose readings: utilizing FreeStyle Libre CGM; Reports fastings will generally be well controlled ~110-120s, but readings to >200 later in the day Date of Review: 04/19/20 Average Glucose: 144 mg/dL Time in Goal: (target 70-180) Sensor Usage: 48% - Time  in range 70-180: 83% - Time above range: 17% - Time below range: 0% . Cardiovascular risk reduction: o Current hypertensive regimen: n/a o Current hyperlipidemia regimen: atorvastatin 40 mg daily o Current antiplatelet regimen: ASA 81 mg daily, clopidogrel 75 mg daily . Eye exam: due . Nephropathy screening: up to date . Foot Exam: due . Depression/Anxiety: citalopram 10 mg daily, bupropion 150 mg QAM, 75 mg QPM, diazepam 5 mg QPM, Lunesta 3 mg QPM . GI (s/p gastric bypass, chronic diarrhea): dicyclomine 10 mg QID PRN, hyoscyamine 10 mg QID PRN . "Leg cramps": pregabalin 100 mg QPM PRN, levetiracetam 500 mg QPM PRN . COPD: Anoro daily PRN, albuterol HFA PRN  . Allergies/chronic cough: ipratropium nasal PRN,   Pharmacist Clinical Goal(s):  [name] Over the next 90 days, patient will work with PharmD and primary care provider to address optimized medication management  Interventions: . Comprehensive medication review performed, medication list updated in electronic medical record . Inter-disciplinary care team collaboration (see longitudinal plan of care) . Reviewed normal glucose readings, and appropriateness of more relaxed glucose goals given medical complexity. Given controlled fastings, will avoid increasing Lantus. Continue Farxiga, consider dose increase at next appt.  . Discussed need to scan CGM at least QID (sensor usage only 48%) to pick up all readings. Patient verbalizes understanding . Discussed keeping a food diary to determine what types of meals result in post prandial readings in 200s. Can match this up with CGM downloads at future appt to determine next medication steps. Patient verbalizes understanding . Reviewed medication list  w/ literature of medications that can cause a metallic taste. None of patient's medications are associated with a metallic taste. . Will collaborate w/ clinical staff to determine next steps for patient picking up FOBT  Patient Self Care Activities:   . Patient will scan glucose at least QID , document, and provide at future appointments . Patient will take medications as prescribed . Patient will report any questions or concerns to provider  . Patient will start keeping a food diary to determine what types of meals cause significant sugar elevations  Please see past updates related to this goal by clicking on the "Past Updates" button in the selected goal         The patient verbalized understanding of instructions provided today and declined a print copy of patient instruction materials.   Plan:  - Scheduled f/u face to face for CGM download in ~5 weeks  Catie Darnelle Maffucci, PharmD, [place], CPP Clinical Pharmacist [place] [phone]

## 2020-04-20 ENCOUNTER — Encounter: Payer: Self-pay | Admitting: *Deleted

## 2020-04-20 ENCOUNTER — Other Ambulatory Visit: Payer: Self-pay | Admitting: Internal Medicine

## 2020-04-20 DIAGNOSIS — Z955 Presence of coronary angioplasty implant and graft: Secondary | ICD-10-CM

## 2020-04-20 NOTE — Progress Notes (Signed)
Cardiac Individual Treatment Plan  Patient Details  Name: Denise Sharp MRN: [id number] Date of Birth: [birth year] Referring Provider:     Cardiac Rehab from 01/25/2020 in [facility] Cardiac and Pulmonary Rehab  Referring Provider Arida (End)      Initial Encounter Date:    Cardiac Rehab from 01/25/2020 in [facility] Cardiac and Pulmonary Rehab  Date 01/25/20      Visit Diagnosis: Status post coronary artery stent placement  Patient's Home Medications on Admission:  Current Outpatient Medications:  .  albuterol (VENTOLIN HFA) 108 (90 Base) MCG/ACT inhaler, Inhale 2 puffs into the lungs every 8 (eight) hours as needed for wheezing or shortness of breath., Disp: 8 g, Rfl: 2 .  aspirin EC 81 MG tablet, Take 1 tablet (81 mg total) by mouth daily., Disp: 30 tablet, Rfl: 2 .  atorvastatin (LIPITOR) 40 MG tablet, Take 1 tablet (40 mg total) by mouth daily., Disp: 90 tablet, Rfl: 0 .  benzonatate (TESSALON) 200 MG capsule, Take 1 capsule (200 mg total) by mouth 2 (two) times daily as needed for cough., Disp: 30 capsule, Rfl: 5 .  buPROPion (WELLBUTRIN) 75 MG tablet, TAKE 2 TABLETS BY MOUTH EVERY MORNING AND 1 TABLET IN THE AFTERNOON, Disp: 270 tablet, Rfl: 1 .  butalbital-acetaminophen-caffeine (FIORICET) 50-325-40 MG tablet, Take 1 tablet by mouth every 6 (six) hours as needed for headache., Disp: 90 tablet, Rfl: 5 .  citalopram (CELEXA) 10 MG tablet, Take 1 tablet (10 mg total) by mouth daily., Disp: 90 tablet, Rfl: 1 .  clopidogrel (PLAVIX) 75 MG tablet, Take 1 tablet (75 mg total) by mouth daily., Disp: 90 tablet, Rfl: 0 .  clotrimazole-betamethasone (LOTRISONE) cream, Apply 1 application topically 2 (two) times daily. (Patient taking differently: Apply 1 application topically 2 (two) times daily as needed (rash). ), Disp: 45 g, Rfl: 3 .  Continuous Blood Gluc Receiver (FREESTYLE LIBRE 14 DAY READER) DEVI, 1 applicator by Does not apply route 4 (four) times daily., Disp: 1 each, Rfl: 6 .  Continuous  Blood Gluc Sensor (FREESTYLE LIBRE 14 DAY SENSOR) MISC, Use to test blood sugar 4 times daily, Disp: 1 each, Rfl: 6 .  cyanocobalamin (,VITAMIN B-12,) 1000 MCG/ML injection, Inject 1 ml (1000 mcg ) IM weekly x 4,  Then monthly thereafter (Patient taking differently: Inject 1,000 mcg into the muscle every 30 (thirty) days. On the 12th day), Disp: 110 mL, Rfl: 0 .  dapagliflozin propanediol (FARXIGA) 5 MG TABS tablet, Take 1 tablet (5 mg total) by mouth daily before breakfast., Disp: 90 tablet, Rfl: 0 .  diazepam (VALIUM) 5 MG tablet, Take 1 tablet (5 mg total) by mouth at bedtime as needed for anxiety., Disp: 30 tablet, Rfl: 5 .  dicyclomine (BENTYL) 10 MG capsule, Take 1 capsule (10 mg total) by mouth 4 (four) times daily -  before meals and at bedtime., Disp: 360 capsule, Rfl: 3 .  diphenhydrAMINE (BENADRYL) 25 MG tablet, Take 50 mg by mouth daily as needed for allergies. , Disp: , Rfl:  .  diphenoxylate-atropine (LOMOTIL) 2.5-0.025 MG tablet, Take 1 tablet by mouth 4 (four) times daily as needed for diarrhea or loose stools., Disp: 60 tablet, Rfl: 5 .  DROPLET PEN NEEDLES 31G X 5 MM MISC, USE ONE NEEDLE SUBCUTANEOUSLY AS DIRECTED (REMOVE AND DISCARD NEEDLE IN SHARPS CONTAINER IMMEDIATELY AFTER USE), Disp: 100 each, Rfl: 1 .  Eszopiclone 3 MG TABS, Take 1 tablet (3 mg total) by mouth at bedtime., Disp: 90 tablet, Rfl: 1 .  hyoscyamine (  ANASPAZ) 0.125 MG TBDP disintergrating tablet, DISSOLVE UNDER TONGUE 15 MINUTES BEFORE MEALS, Disp: 60 tablet, Rfl: 0 .  Insulin Glargine (LANTUS SOLOSTAR) 100 UNIT/ML Solostar Pen, Inject 11 Units into the skin daily. (Patient taking differently: Inject 10-15 Units into the skin daily. ), Disp: 5 pen, Rfl: 3 .  ipratropium (ATROVENT) 0.06 % nasal spray, Place 2 sprays into both nostrils 4 (four) times daily., Disp: 15 mL, Rfl: 12 .  levETIRAcetam (KEPPRA) 750 MG tablet, Take 1 tablet (750 mg total) by mouth at bedtime. (Patient taking differently: Take 750 mg by mouth at  bedtime as needed (restless legs). ), Disp: 90 tablet, Rfl: 3 .  neomycin-polymyxin-hydrocortisone (CORTISPORIN) OTIC solution, Place 3 drops into both ears 4 (four) times daily. For otitis externa, Disp: 10 mL, Rfl: 0 .  nitroGLYCERIN (NITROSTAT) 0.4 MG SL tablet, Place 1 tablet (0.4 mg total) under the tongue every 5 (five) minutes as needed for chest pain. (Patient not taking: Reported on 03/15/2020), Disp: 20 tablet, Rfl: 1 .  NONFORMULARY OR COMPOUNDED ITEM, [facility]  Combination Pain Cream -  Baclofen 2%, Doxepin 5%, Gabapentin 6%, Topiramate 2%, Pentoxifylline 3% Apply 1-2 grams to affected area 3-4 times daily Qty. 120 gm 3 refills, Disp: 1 each, Rfl: 3 .  nystatin (NYAMYC) powder, Apply 1 g topically 4 (four) times daily as needed (rash).  (Patient not taking: Reported on 03/15/2020), Disp: , Rfl:  .  ondansetron (ZOFRAN ODT) 4 MG disintegrating tablet, Take 1 tablet (4 mg total) by mouth every 8 (eight) hours as needed for nausea or vomiting. (Patient not taking: Reported on 03/15/2020), Disp: 20 tablet, Rfl: 0 .  OVER THE COUNTER MEDICATION, Apply 1 application topically daily as needed (pain). [place], Disp: , Rfl:  .  pregabalin (LYRICA) 100 MG capsule, Take 100 mg by mouth at bedtime as needed (leg cramps). , Disp: , Rfl:  .  traMADol (ULTRAM) 50 MG tablet, Take 0.5 tablets (25 mg total) by mouth every 12 (twelve) hours as needed for severe pain., Disp: 30 tablet, Rfl: 0 .  umeclidinium-vilanterol (ANORO ELLIPTA) 62.5-25 MCG/INH AEPB, Inhale 1 puff into the lungs daily., Disp: 1 each, Rfl: 12  Past Medical History: Past Medical History:  Diagnosis Date  . Abnormality of gait 03/25/2013  . Adrenal mass, left ([place])   . Anemia    iron deficient post  2 unit txfsn 2009, normal endo/colonoscopy by [facility]  . Arthritis   . Atypical chest pain    a. 04/1986 Cath ([facility]): nl cors, EF 65%; b. 07/2012 Cath [facility] LM nl, LAD/LCX/RCA w/ min irregs; c. 07/2016 MV [facility]): "Equivocal" w/ small  inf wall rev defect and hyperdynamic LV contraction, EF 87%; d. 08/2016 Cardiac CT Ca2+ score [facility]): Ca2+ score 1548; e. 05/2019 MV: No ischemia. Diaph atten. EF 75%.  . Cervical spinal stenosis 1994   due to trauma to back ([employer] accident), has intermittent paralysis and parasthesias  . Cervicogenic headache 03/23/2014  . Depression   . Diastolic dysfunction    a. 12/2019 Echo: EF 65-70%, Gr2 DD. No significant valvular dzs.  . Dizziness    chronic dizziness  . DJD (degenerative joint disease)    a. Chronic R shoulder pain pending R shoulder replacement 07/2017.  [name] Esophageal stenosis March 2011   with transietn outlet obstruction by food, cleared by EGD   . Family history of adverse reaction to anesthesia    daughter PONV  . Gastric bypass status for obesity   . Headache(784.0)   .  Hypertension   . IBS (irritable bowel syndrome)   . Left bundle branch block (LBBB)    a. Intermittently present - likely rate related. - patient denies  . Obesity   . Obstructive sleep apnea    using CPAP  . 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 Restless leg syndrome   . Rotator cuff arthropathy, right 08/13/2017  . Syncope and collapse 03/12/2014  . Type II diabetes mellitus (HCC)     Tobacco Use: Social History   Tobacco Use  Smoking Status Never Smoker  Smokeless Tobacco Never Used    Labs: Recent Review Flowsheet Data    Labs for ITP Cardiac and Pulmonary Rehab Latest Ref Rng & Units 08/14/2018 03/06/2019 06/09/2019 12/31/2019 01/25/2020   Cholestrol 0 - 200 mg/dL - - - 183 -   LDLCALC 0 - 99 mg/dL - - - 113(H) -   LDLDIRECT mg/dL - - - - -   HDL >40 mg/dL - - - 42 -   Trlycerides <150 mg/dL - - - 140 -   Hemoglobin A1c 4.8 - 5.6 % 6.5 7.4(H) 7.2(H) 7.7(H) 7.6(H)   TCO2 22 - 32 mmol/L - - - - -       Exercise Target Goals: Exercise Program Goal: Individual exercise prescription set using results from initial 6 min walk test and THRR while considering  patient's activity  barriers and safety.   Exercise Prescription Goal: Initial exercise prescription builds to 30-45 minutes a day of aerobic activity, 2-3 days per week.  Home exercise guidelines will be given to patient during program as part of exercise prescription that the participant will acknowledge.   Education: Aerobic Exercise & Resistance Training: - Gives group verbal and written instruction on the various components of exercise. Focuses on aerobic and resistive training programs and the benefits of this training and how to safely progress through these programs..   Cardiac Rehab from 03/09/2020 in [facility] Cardiac and Pulmonary Rehab  Date 03/09/20  Educator AS  Instruction Review Code 1- Verbalizes Understanding      Education: Exercise & Equipment Safety: - Individual verbal instruction and demonstration of equipment use and safety with use of the equipment.   Cardiac Rehab from 03/09/2020 in [facility] Cardiac and Pulmonary Rehab  Date 01/25/20  Educator AS  Instruction Review Code 1- Verbalizes Understanding      Education: Exercise Physiology & General Exercise Guidelines: - Group verbal and written instruction with models to review the exercise physiology of the cardiovascular system and associated critical values. Provides general exercise guidelines with specific guidelines to those with heart or lung disease.    Cardiac Rehab from 03/09/2020 in [facility] Cardiac and Pulmonary Rehab  Date 01/27/20  Educator AS  Instruction Review Code 1- Verbalizes Understanding      Education: Flexibility, Balance, Mind/Body Relaxation: Provides group verbal/written instruction on the benefits of flexibility and balance training, including mind/body exercise modes such as yoga, pilates and tai chi.  Demonstration and skill practice provided.   Activity Barriers & Risk Stratification:  Activity Barriers & Cardiac Risk Stratification - 01/15/20 1411      Activity Barriers & Cardiac Risk Stratification    Activity Barriers Arthritis;Back Problems;Muscular Weakness;Neck/Spine Problems;Left Knee Replacement;Right Knee Replacement;Balance Concerns;Assistive Device;Joint Problems    Cardiac Risk Stratification High           6 Minute Walk:  6 Minute Walk    Row Name 01/25/20 1438         6 Minute Walk   Phase Initial  Distance 400 feet     Walk Time 5.5 minutes     # of Rest Breaks 0     MPH 0.75     METS 0.5     RPE 15     Perceived Dyspnea  2     VO2 Peak 1.81     Symptoms Yes (comment)     Comments R hip/buttock 3/10     Resting HR 73 bpm     Resting BP 108/68     Resting Oxygen Saturation  98 %     Exercise Oxygen Saturation  during 6 min walk 97 %     Max Ex. HR 102 bpm     Max Ex. BP 146/60     2 Minute Post BP 130/68            Oxygen Initial Assessment:   Oxygen Re-Evaluation:   Oxygen Discharge (Final Oxygen Re-Evaluation):   Initial Exercise Prescription:  Initial Exercise Prescription - 01/25/20 1400      Date of Initial Exercise RX and Referring Provider   Date 01/25/20    Referring Provider Arida (End)      Recumbant Bike   Level 1    RPM 60    Minutes 15      NuStep   Level 1    SPM 80    Minutes 15      Arm Ergometer   Level 1    RPM 25    Minutes 15      Prescription Details   Frequency (times per week) 3    Duration Progress to 30 minutes of continuous aerobic without signs/symptoms of physical distress      Intensity   THRR 40-80% of Max Heartrate 102-131    Ratings of Perceived Exertion 11-15    Perceived Dyspnea 0-4      Resistance Training   Training Prescription Yes    Weight 3 lb    Reps 10-15           Perform Capillary Blood Glucose checks as needed.  Exercise Prescription Changes:  Exercise Prescription Changes    Row Name 01/25/20 1400 02/09/20 1500 02/23/20 1500 03/09/20 1300 04/07/20 0800     Response to Exercise   Blood Pressure (Admit) 108/68 122/70 126/70 122/82 130/62   Blood Pressure  (Exercise) 146/66 130/68 132/68 158/80 140/64   Blood Pressure (Exit) 130/68 122/60 118/70 138/80 126/70   Heart Rate (Admit) 73 bpm 92 bpm 88 bpm 91 bpm 87 bpm   Heart Rate (Exercise) 102 bpm 99 bpm 126 bpm 85 bpm 97 bpm   Heart Rate (Exit) 74 bpm 90 bpm 86 bpm 75 bpm 77 bpm   Oxygen Saturation (Admit) 98 % -- -- -- --   Oxygen Saturation (Exercise) 97 % -- -- -- --   Rating of Perceived Exertion (Exercise) _0 Perceived Dyspnea (Exercise) 2 -- -- -- --   Symptoms R hip buttock 3/10 unable to do bike with hip -- none none   Duration -- Progress to 30 minutes of  aerobic without signs/symptoms of physical distress Progress to 30 minutes of  aerobic without signs/symptoms of physical distress Continue with 30 min of aerobic exercise without signs/symptoms of physical distress. Continue with 30 min of aerobic exercise without signs/symptoms of physical distress.   Intensity -- THRR unchanged THRR unchanged THRR unchanged THRR unchanged     Progression   Progression -- Continue to progress workloads to maintain intensity  without signs/symptoms of physical distress. Continue to progress workloads to maintain intensity without signs/symptoms of physical distress. Continue to progress workloads to maintain intensity without signs/symptoms of physical distress. Continue to progress workloads to maintain intensity without signs/symptoms of physical distress.   Average METs -- 1.8 2.5 1.6 1.4     Resistance Training   Training Prescription -- Yes Yes Yes Yes   Weight -- 3 lb 3 lb 3 lb 3 lb   Reps -- 10-15 10-15 10-15 10-15     Interval Training   Interval Training -- No No No No     Recumbant Bike   Level -- -- 1 -- --   RPM -- -- 60 -- --   Minutes -- -- 15 -- --     NuStep   Level -- _0 SPM -- -- 80 -- --   Minutes -- _1 METs -- 1.8 2.2 1.6 1.4     Arm Ergometer   Level -- 1 -- -- --   Minutes -- 15 -- -- --   METs -- 1.8 -- -- --     REL-XR   Level  -- -- -- 1 --   Minutes -- -- -- 30 --          Exercise Comments:   Exercise Goals and Review:  Exercise Goals    Row Name 01/25/20 1454             Exercise Goals   Increase Physical Activity Yes       Intervention Provide advice, education, support and counseling about physical activity/exercise needs.;Develop an individualized exercise prescription for aerobic and resistive training based on initial evaluation findings, risk stratification, comorbidities and participant's personal goals.       Expected Outcomes Short Term: Attend rehab on a regular basis to increase amount of physical activity.;Long Term: Add in home exercise to make exercise part of routine and to increase amount of physical activity.;Long Term: Exercising regularly at least 3-5 days a week.       Increase Strength and Stamina Yes       Intervention Provide advice, education, support and counseling about physical activity/exercise needs.;Develop an individualized exercise prescription for aerobic and resistive training based on initial evaluation findings, risk stratification, comorbidities and participant's personal goals.       Expected Outcomes Short Term: Increase workloads from initial exercise prescription for resistance, speed, and METs.;Short Term: Perform resistance training exercises routinely during rehab and add in resistance training at home;Long Term: Improve cardiorespiratory fitness, muscular endurance and strength as measured by increased METs and functional capacity (6MWT)       Able to understand and use rate of perceived exertion (RPE) scale Yes       Intervention Provide education and explanation on how to use RPE scale       Expected Outcomes Short Term: Able to use RPE daily in rehab to express subjective intensity level;Long Term:  Able to use RPE to guide intensity level when exercising independently       Knowledge and understanding of Target Heart Rate Range (THRR) Yes       Intervention  Provide education and explanation of THRR including how the numbers were predicted and where they are located for reference       Expected Outcomes Short Term: Able to state/look up THRR;Short Term: Able to use daily as guideline for intensity in rehab;Long Term: Able to use THRR to  govern intensity when exercising independently       Able to check pulse independently Yes       Intervention Provide education and demonstration on how to check pulse in carotid and radial arteries.;Review the importance of being able to check your own pulse for safety during independent exercise       Expected Outcomes Short Term: Able to explain why pulse checking is important during independent exercise;Long Term: Able to check pulse independently and accurately       Understanding of Exercise Prescription Yes       Intervention Provide education, explanation, and written materials on patient's individual exercise prescription       Expected Outcomes Short Term: Able to explain program exercise prescription;Long Term: Able to explain home exercise prescription to exercise independently              Exercise Goals Re-Evaluation :  Exercise Goals Re-Evaluation    Row Name 01/27/20 1421 02/09/20 1511 02/23/20 1522 03/07/20 1424 03/09/20 1354     Exercise Goal Re-Evaluation   Exercise Goals Review Increase Physical Activity;Able to understand and use rate of perceived exertion (RPE) scale;Knowledge and understanding of Target Heart Rate Range (THRR);Understanding of Exercise Prescription;Increase Strength and Stamina;Able to check pulse independently Increase Physical Activity;Increase Strength and Stamina;Understanding of Exercise Prescription Increase Physical Activity;Increase Strength and Stamina;Understanding of Exercise Prescription Increase Physical Activity;Increase Strength and Stamina;Understanding of Exercise Prescription Increase Physical Activity;Increase Strength and Stamina;Understanding of Exercise  Prescription   Comments Reviewed RPE and dyspnea scales, THR and program prescription with pt today.  Pt voiced understanding and was given a copy of goals to take home. Synda is off to a good start in rehab.  She is now up to 1.8 METs on the arm crank.  She was unable to do the bike, so we have rearranged her equipment to what she can use.  We will continue to monitor her progress. Jennise tolerates exercise well and is able to work in the correct THR range.  Staff will monitor progress. Jaylenne has only been able to walk outside program sessions.  She is very tired most days. Joslin has been doing well in rehab. She is on level 2 for the T5.  She continues to shuffle her gait with short steps and not open to trying anything else to help.  We will continue to monitor her progress.   Expected Outcomes Short: Use RPE daily to regulate intensity. Long: Follow program prescription in THR. Short: Continue to attend regularly Long: Continue to improve stamina. Short: Continue to attend regularly Long: Continue to improve stamina. Short: talk with Dr about energy level Long:  improve stamina and breathing Short: Continue to work hard in rehab.  Long: Improve stamina   Row Name 04/07/20 0801             Exercise Goal Re-Evaluation   Exercise Goals Review Increase Physical Activity;Increase Strength and Stamina;Understanding of Exercise Prescription       Comments Arisbeth has only attend once since last review due to her fall and her husband's cataract surgery. She was encouraged to walk and move at home as much as possible.  We will pick back up once she returns and continue to monitor her progress.       Expected Outcomes Short: Return to regular exercise Long: Continue to improve stamina.              Discharge Exercise Prescription (Final Exercise Prescription Changes):  Exercise Prescription Changes - 04/07/20  0800      Response to Exercise   Blood Pressure (Admit) 130/62    Blood Pressure (Exercise)  140/64    Blood Pressure (Exit) 126/70    Heart Rate (Admit) 87 bpm    Heart Rate (Exercise) 97 bpm    Heart Rate (Exit) 77 bpm    Rating of Perceived Exertion (Exercise) 11    Symptoms none    Duration Continue with 30 min of aerobic exercise without signs/symptoms of physical distress.    Intensity THRR unchanged      Progression   Progression Continue to progress workloads to maintain intensity without signs/symptoms of physical distress.    Average METs 1.4      Resistance Training   Training Prescription Yes    Weight 3 lb    Reps 10-15      Interval Training   Interval Training No      NuStep   Level 4    Minutes 30    METs 1.4           Nutrition:  Target Goals: Understanding of nutrition guidelines, daily intake of sodium <1540m, cholesterol <2054m calories 30% from fat and 7% or less from saturated fats, daily to have 5 or more servings of fruits and vegetables.  Education: Controlling Sodium/Reading Food Labels -Group verbal and written material supporting the discussion of sodium use in heart healthy nutrition. Review and explanation with models, verbal and written materials for utilization of the food label.   Education: General Nutrition Guidelines/Fats and Fiber: -Group instruction provided by verbal, written material, models and posters to present the general guidelines for heart healthy nutrition. Gives an explanation and review of dietary fats and fiber.   Biometrics:  Pre Biometrics - 01/25/20 1455      Pre Biometrics   Height _0  (1.651 m)    Weight 241 lb 3.2 oz (109.4 kg)    BMI (Calculated) 40.14            Nutrition Therapy Plan and Nutrition Goals:  Nutrition Therapy & Goals - 02/29/20 1643      Nutrition Therapy   Diet soft/liquid    Protein (specify units) 90g    Fiber 25 grams    Whole Grain Foods 3 servings    Saturated Fats 12 max. grams    Fruits and Vegetables 5 servings/day    Sodium 1.5 grams      Personal  Nutrition Goals   Nutrition Goal ST: small frequent meals of liquids and soft foods. LT: Tamiki: increase energy and cannot perform ADLs like folding laundry - 3 years 3-6 months on bed rest each year. Build muscle.    Comments Pt primary diagnosis for Cardiac Rehab is S/P coronary artery stent placement. 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 Pt also presents with medulloadrenal hyperfunction, OSA on CPAP, dysphagia, and hearing loss. Pt has many conditions which could affect her mobility and ADLs including siatica, degenerative disc disease, arthropathy, and osteoarthrosis. Per family medicine pt has started a KETO diet enforced by daughter. Her husband,Tony, picks up food a lot. LiRaenetteeports that they don't eat much so they don't cook much and she is lethargic she feels like she cant even defrost broccoli (suggested frozen vegetables that are easy to prepare if restaurants do not have any). 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 Lylla reports getting nauseous and having reflux even with a bite of food and even sometimes saliva; encouraged pt to talk to her MD regarding this as less than 500kcal/day for 7-8 months can lead to malnutrition and other health issues - if she cannot do the surgery earlier maybe MD can provide tube feeds that bypasses the esophagus. Encouraged soft and liquid foods; pt reports not liking supplements - suggested at home protein shakes with nut butter and greek yogurt, proats (oats with egg whites and nut butter), mashed vegetables and beans, meatloaf, fish, steamed vegetables - pt was not receptive. Pt reports wanting a meal plan - will provide some meal ideas again, but expect with her lethargy and disinterest with suggestions this may not be helpful. Shaka reports having IBS and having 50-75 bowel movements per day  even when NPO at the hospital as well as having a transit time so fast that after eating a salad, fully formed lettuce will be in her stool. Suspect that due to the little amount that she is eating her bowel movements are not as frequent, but frequent enough to be problematic. Suspect that if her transit time was that fast constantly for her whole life she would be malnourished and stuggle to maintain weight as well as meet her needs. Discussed heart healhty/ diabetes friendly eating - focused MNT for IBS and dysphagia.      Intervention Plan   Intervention Prescribe, educate and counsel regarding individualized specific dietary modifications aiming towards targeted core components such as weight, hypertension, lipid management, diabetes, heart failure and other comorbidities.;Nutrition handout(s) given to patient.    Expected Outcomes Short Term Goal: Understand basic principles of dietary content, such as calories, fat, sodium, cholesterol and nutrients.;Short Term Goal: A plan has been developed with personal nutrition goals set during dietitian appointment.;Long Term Goal: Adherence to prescribed nutrition plan.           Nutrition Assessments:  Nutrition Assessments - 01/25/20 1500      Rate Your Plate Scores   Pre Score 41           MEDIFICTS Score Key:          ?70 Need to make dietary changes          40-70 Heart Healthy Diet         ? 40 Therapeutic Level Cholesterol Diet  Nutrition Goals Re-Evaluation:   Nutrition Goals Discharge (Final Nutrition Goals Re-Evaluation):   Psychosocial: Target Goals: Acknowledge presence or absence of significant depression and/or stress, maximize coping skills, provide positive support system. Participant is able to verbalize types and ability to use techniques and skills needed for reducing stress and depression.   Education: Depression - Provides group verbal and written instruction on the correlation between heart/lung disease and  depressed mood, treatment options, and the stigmas associated with seeking treatment.   Cardiac Rehab from 03/09/2020 in [facility] Cardiac and Pulmonary Rehab  Date 02/17/20  Educator SB  Instruction Review Code 1- [employer] Understanding      Education: Sleep Hygiene -Provides group verbal and written instruction about how sleep can affect your health.  Define sleep hygiene, discuss sleep cycles and impact of sleep habits. Review good sleep hygiene tips.     Education: Stress and Anxiety: - Provides group verbal and written instruction about the health risks of elevated stress and causes of high stress.  Discuss the correlation between heart/lung disease and anxiety and treatment options. Review healthy ways to manage with  stress and anxiety.   Cardiac Rehab from 03/09/2020 in [facility] Cardiac and Pulmonary Rehab  Date 02/17/20  Educator SB  Instruction Review Code 1- Verbalizes Understanding       Initial Review & Psychosocial Screening:  Initial Psych Review & Screening - 01/15/20 1414      Initial Review   Current issues with Current Stress Concerns    Source of Stress Concerns Chronic Illness      Family Dynamics   Good Support System? Yes   husband, daughter     Barriers   Psychosocial barriers to participate in program There are no identifiable barriers or psychosocial needs.;The patient should benefit from training in stress management and relaxation.      Screening Interventions   Interventions Encouraged to exercise;To provide support and resources with identified psychosocial needs;Provide feedback about the scores to participant    Expected Outcomes Short Term goal: Utilizing psychosocial counselor, staff and physician to assist with identification of specific Stressors or current issues interfering with healing process. Setting desired goal for each stressor or current issue identified.;Long Term Goal: Stressors or current issues are controlled or eliminated.;Short Term goal:  Identification and review with participant of any Quality of Life or Depression concerns found by scoring the questionnaire.;Long Term goal: The participant improves quality of Life and PHQ9 Scores as seen by post scores and/or verbalization of changes           Quality of Life Scores:   Quality of Life - 01/25/20 1500      Quality of Life   Select Quality of Life      Quality of Life Scores   Health/Function Pre 14.47 %    Socioeconomic Pre 18.25 %    Psych/Spiritual Pre 23.43 %    Family Pre 26.4 %    GLOBAL Pre 18.86 %          Scores of 19 and below usually indicate a poorer quality of life in these areas.  A difference of  2-3 points is a clinically meaningful difference.  A difference of 2-3 points in the total score of the Quality of Life Index has been associated with significant improvement in overall quality of life, self-image, physical symptoms, and general health in studies assessing change in quality of life.  PHQ-9: Recent Review Flowsheet Data    Depression screen [facility] 2/9 02/24/2020 02/17/2020 01/25/2020 01/13/2020 06/24/2019   Decreased Interest 1 1 0 0 0   Down, Depressed, Hopeless 0 0 0 0 0   PHQ - 2 Score 1 1 0 0 0   Altered sleeping _0 - -   Tired, decreased energy _1 - -   Change in appetite 2 2 0 - -   Feeling bad or failure about yourself  1 0 0 - -   Trouble concentrating 0 2 0 - -   Moving slowly or fidgety/restless 0 0 3 - -   Suicidal thoughts 0 0 0 - -   PHQ-9 Score _2 - -   Difficult doing work/chores Somewhat difficult Somewhat difficult Extremely dIfficult - -     Interpretation of Total Score  Total Score Depression Severity:  1-4 = Minimal depression, 5-9 = Mild depression, 10-14 = Moderate depression, 15-19 = Moderately severe depression, 20-27 = Severe depression   Psychosocial Evaluation and Intervention:  Psychosocial Evaluation - 01/15/20 1415      Psychosocial Evaluation & Interventions   Interventions Encouraged to  exercise with  the program and follow exercise prescription    Comments Jailey has a long history of health issues. She is curious to see what she can do during cardiac rehab given her history of mobility issues. She wants to improve her stamina and balance. She reports a good support system of her husband and her daughter.    Expected Outcomes Short: attend cardiac rehab for exercise and education. Long: develop positive self care habits.    Continue Psychosocial Services  Follow up required by staff           Psychosocial Re-Evaluation:  Psychosocial Re-Evaluation    [place] Name 02/17/20 1509 03/07/20 1421           Psychosocial Re-Evaluation   Current issues with Current Sleep Concerns;Current Stress Concerns Current Sleep Concerns;Current Stress Concerns      Comments Jerzie states she feels a little bit better since being able to get out and about. She still struggles with chronic insomnia. Her PHQ was reviewed by staff and will be repeated as it is still high. Merrissa has been sleepng better according to her Fitbit - between 6-8 hours a night.  She still feels tired and has no energy most days.  Staff recommended she talk with Dr about tiredness as well.      Expected Outcomes Short: attend cardiac rehab for educaion, exercise and something to do out of the house. Long: find a positive self care habit. Short:  continue to attend Cardiac Rehab and sleep well Long:  maintain good sleep      Continue Psychosocial Services  Follow up required by staff --             Psychosocial Discharge (Final Psychosocial Re-Evaluation):  Psychosocial Re-Evaluation - 03/07/20 1421      Psychosocial Re-Evaluation   Current issues with Current Sleep Concerns;Current Stress Concerns    Comments Gerrie has been sleepng better according to her Fitbit - between 6-8 hours a night.  She still feels tired and has no energy most days.  Staff recommended she talk with Dr about tiredness as well.    Expected Outcomes  Short:  continue to attend Cardiac Rehab and sleep well Long:  maintain good sleep           Vocational Rehabilitation: Provide vocational rehab assistance to qualifying candidates.   Vocational Rehab Evaluation & Intervention:  Vocational Rehab - 01/15/20 1414      Initial Vocational Rehab Evaluation & Intervention   Assessment shows need for Vocational Rehabilitation No           Education: Education Goals: Education classes will be provided on a variety of topics geared toward better understanding of heart health and risk factor modification. Participant will state understanding/return demonstration of topics presented as noted by education test scores.  Learning Barriers/Preferences:  Learning Barriers/Preferences - 01/15/20 1414      Learning Barriers/Preferences   Learning Barriers None    Learning Preferences None           General Cardiac Education Topics:  AED/CPR: - Group verbal and written instruction with the use of models to demonstrate the basic use of the AED with the basic ABC's of resuscitation.   Anatomy & Physiology of the Heart: - Group verbal and written instruction and models provide basic cardiac anatomy and physiology, with the coronary electrical and arterial systems. Review of Valvular disease and Heart Failure   Cardiac Procedures: - Group verbal and written instruction to review commonly prescribed medications for heart  disease. Reviews the medication, class of the drug, and side effects. Includes the steps to properly store meds and maintain the prescription regimen. (beta blockers and nitrates)   Cardiac Rehab from 03/09/2020 in [facility] Cardiac and Pulmonary Rehab  Date 03/09/20  Educator [facility]  Instruction Review Code 1- Verbalizes Understanding      Cardiac Medications I: - Group verbal and written instruction to review commonly prescribed medications for heart disease. Reviews the medication, class of the drug, and side effects. Includes  the steps to properly store meds and maintain the prescription regimen.   Cardiac Medications II: -Group verbal and written instruction to review commonly prescribed medications for heart disease. Reviews the medication, class of the drug, and side effects. (all other drug classes)   Cardiac Rehab from 03/09/2020 in [facility] Cardiac and Pulmonary Rehab  Date 02/24/20  Educator SB  Instruction Review Code 1- Verbalizes Understanding       Go Sex-Intimacy & Heart Disease, Get SMART - Goal Setting: - Group verbal and written instruction through game format to discuss heart disease and the return to sexual intimacy. Provides group verbal and written material to discuss and apply goal setting through the application of the S.M.A.R.T. Method.   Cardiac Rehab from 03/09/2020 in [facility] Cardiac and Pulmonary Rehab  Date 03/09/20  Educator [facility]  Instruction Review Code 1- Verbalizes Understanding      Other Matters of the Heart: - Provides group verbal, written materials and models to describe Stable Angina and Peripheral Artery. Includes description of the disease process and treatment options available to the cardiac patient.   Infection Prevention: - Provides verbal and written material to individual with discussion of infection control including proper hand washing and proper equipment cleaning during exercise session.   Cardiac Rehab from 03/09/2020 in [facility] Cardiac and Pulmonary Rehab  Date 01/25/20  Educator AS  Instruction Review Code 1- Verbalizes Understanding      Falls Prevention: - Provides verbal and written material to individual with discussion of falls prevention and safety.   Cardiac Rehab from 03/09/2020 in [facility] Cardiac and Pulmonary Rehab  Date 01/25/20  Educator AS  Instruction Review Code 1- Verbalizes Understanding      Other: -Provides group and verbal instruction on various topics (see comments)   Knowledge Questionnaire Score:  Knowledge Questionnaire Score -  01/25/20 1500      Knowledge Questionnaire Score   Pre Score 21/26           Core Components/Risk Factors/Patient Goals at Admission:  Personal Goals and Risk Factors at Admission - 01/25/20 1504      Core Components/Risk Factors/Patient Goals on Admission    Weight Management Yes    Intervention Weight Management: Develop a combined nutrition and exercise program designed to reach desired caloric intake, while maintaining appropriate intake of nutrient and fiber, sodium and fats, and appropriate energy expenditure required for the weight goal.;Weight Management: Provide education and appropriate resources to help participant work on and attain dietary goals.;Weight Management/Obesity: Establish reasonable short term and long term weight goals.    Admit Weight 241 lb 3.2 oz (109.4 kg)    Goal Weight: Short Term 235 lb (106.6 kg)    Goal Weight: Long Term 220 lb (99.8 kg)    Expected Outcomes Short Term: Continue to assess and modify interventions until short term weight is achieved;Long Term: Adherence to nutrition and physical activity/exercise program aimed toward attainment of established weight goal;Weight Loss: Understanding of general recommendations for a balanced deficit meal plan,  which promotes 1-2 lb weight loss per week and includes a negative energy balance of [phone] kcal/d;Understanding recommendations for meals to include 15-35% energy as protein, 25-35% energy from fat, 35-60% energy from carbohydrates, less than 260m of dietary cholesterol, 20-35 gm of total fiber daily;Understanding of distribution of calorie intake throughout the day with the consumption of 4-5 meals/snacks    Intervention Provide education about signs/symptoms and action to take for hypo/hyperglycemia.;Provide education about proper nutrition, including hydration, and aerobic/resistive exercise prescription along with prescribed medications to achieve blood glucose in normal ranges: Fasting glucose 65-99  mg/dL    Expected Outcomes Short Term: Participant verbalizes understanding of the signs/symptoms and immediate care of hyper/hypoglycemia, proper foot care and importance of medication, aerobic/resistive exercise and nutrition plan for blood glucose control.;Long Term: Attainment of HbA1C < 7%.    Intervention Provide education on lifestyle modifcations including regular physical activity/exercise, weight management, moderate sodium restriction and increased consumption of fresh fruit, vegetables, and low fat dairy, alcohol moderation, and smoking cessation.;Monitor prescription use compliance.    Expected Outcomes Short Term: Continued assessment and intervention until BP is < 140/96mHG in hypertensive participants. < 130/8077mG in hypertensive participants with diabetes, heart failure or chronic kidney disease.;Long Term: Maintenance of blood pressure at goal levels.           Education:Diabetes - Individual verbal and written instruction to review signs/symptoms of diabetes, desired ranges of glucose level fasting, after meals and with exercise. Acknowledge that pre and post exercise glucose checks will be done for 3 sessions at entry of program.   [facility]  Date 01/15[facility]20  [facility] Code [facility]- [employer]derstanding      Education: Know Your Numbers and Risk Factors: -Group verbal and written instruction about important numbers in your health.  Discussion of what are risk factors and how they play a role in the disease process.  Review of Cholesterol, Blood Pressure, Diabetes, and BMI and the role they play in your overall health.   [facility]  Date 02/24/20  Educator SB  Instruction Review Code 1- Verbalizes Understanding      Core Components/Risk Factors/Patient Goals Review:   Goals and Risk Factor Review    Row Name 02/17/20 1454 03/07/20 1418            Core Components/Risk Factors/Patient Goals Review   Personal Goals Review Diabetes;Weight Management/Obesity;Hypertension Diabetes;Weight Management/Obesity;Hypertension      Review Education given on new glucose monitor. Diabetic safety reviewed with patient. 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    LinChristals been checking BG at home it has been out of range at times.  She has appt with Dr TulDerrel Nipursday  about this.      Expected Outcomes Short: keep log of blood sugar. 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    Catrice has been checking BG at home it has been out of range at times.  She has appt with Dr [name] Thursday  about this.    Expected Outcomes Short: follow up with Dr about BG  Long: manage risk factors and other health problems           ITP Comments:  ITP Comments    Row Name 01/15/20 1430 01/25/20 1507 01/27/20 1054 01/27/20 1419 02/24/20 0641   ITP Comments Initial telephone orientation completed. Diagnosis can be found in [facility] 6/16. EP orientation scheduled for 7/12 at 1pm Completed 6MWT and gym orientation. Initial ITP created and sent for review to Dr. Emily Filbert, Medical Director. 30 Day review completed. Medical Director ITP review done, changes made as directed, and signed approval by Medical Director. First full day of exercise!  Patient was oriented to gym and equipment including functions, settings, policies,  and procedures.  Patient's individual exercise prescription and treatment plan were reviewed.  All starting workloads were established based on the results of the 6 minute walk test done at initial orientation visit.  The plan for exercise progression was also introduced and progression will be customized based on patient's performance and goals. 30 Day review completed. Medical Director ITP review done, changes made as directed, and signed approval by Medical Director.   [place]organ Name 03/23/20 1625 04/07/20 0801 04/18/20 1720 04/20/20 0614     ITP Comments 30 day review completed. ITP sent to Dr. Emily Filbert, Medical Director of Cardiac and Pulmonary Rehab. Continue with ITP unless changes are made by physician. Manpreet is out from her fall and her husband having surgery. Floris has not attended since last review. 30 Day review completed. Medical Director ITP review done, changes made as directed, and signed approval by Medical Director.           Comments:

## 2020-04-21 ENCOUNTER — Telehealth: Payer: Self-pay

## 2020-04-21 MED ORDER — CITALOPRAM HYDROBROMIDE 10 MG PO TABS
10.0000 mg | ORAL_TABLET | Freq: Every day | ORAL | 1 refills | Status: DC
Start: 2020-04-21 — End: 2020-09-12

## 2020-04-21 NOTE — Telephone Encounter (Signed)
Pt needs refill on citalopram (CELEXA) 10 MG tablet sent to [facility]

## 2020-04-21 NOTE — Telephone Encounter (Signed)
yes

## 2020-04-21 NOTE — Telephone Encounter (Signed)
Does pt still need to come by the office to pick up the iFOB kit?

## 2020-04-21 NOTE — Progress Notes (Signed)
I have collaborated with the care management provider regarding care management and care coordination activities outlined in this encounter and have reviewed this encounter including documentation in the note and care plan. I am certifying that I agree with the content of this note and encounter as supervising physician.  Regards,   Mekhia Brogan, MD    

## 2020-04-21 NOTE — Telephone Encounter (Signed)
**Note Denise-Identified via Obfuscation** -----   Message from Denise Sharp, [place] sent at 04/19/2020  5:07 PM EDT ----- Patient didn't understand Dr. Lupita Dawn instructions in July to come pick up FOBT. What should she do now?

## 2020-04-22 NOTE — Telephone Encounter (Signed)
Spoke with pt and she stated that she would get it picked up next week.

## 2020-04-25 DIAGNOSIS — G894 Chronic pain syndrome: Secondary | ICD-10-CM | POA: Diagnosis not present

## 2020-04-25 DIAGNOSIS — M5136 Other intervertebral disc degeneration, lumbar region: Secondary | ICD-10-CM | POA: Diagnosis not present

## 2020-04-26 ENCOUNTER — Telehealth: Payer: Self-pay | Admitting: Internal Medicine

## 2020-04-26 NOTE — Telephone Encounter (Signed)
Patient call requesting medications list sent to [facility]

## 2020-04-26 NOTE — Telephone Encounter (Signed)
I spoke with patient today & asked her to toss the IFOB kit that she had picked up from the office today because it is expired. Pt asked that we call her once we receive our new ones.

## 2020-04-27 NOTE — Telephone Encounter (Signed)
Called patient and left a voicemail for patient to pick up a new stool kit.

## 2020-05-03 ENCOUNTER — Encounter: Payer: Self-pay | Admitting: *Deleted

## 2020-05-03 DIAGNOSIS — Z955 Presence of coronary angioplasty implant and graft: Secondary | ICD-10-CM

## 2020-05-04 ENCOUNTER — Encounter: Payer: Medicare Other | Attending: Cardiovascular Disease

## 2020-05-04 DIAGNOSIS — G4733 Obstructive sleep apnea (adult) (pediatric): Secondary | ICD-10-CM | POA: Insufficient documentation

## 2020-05-04 DIAGNOSIS — Z7902 Long term (current) use of antithrombotics/antiplatelets: Secondary | ICD-10-CM | POA: Insufficient documentation

## 2020-05-04 DIAGNOSIS — F329 Major depressive disorder, single episode, unspecified: Secondary | ICD-10-CM | POA: Insufficient documentation

## 2020-05-04 DIAGNOSIS — Z7982 Long term (current) use of aspirin: Secondary | ICD-10-CM | POA: Insufficient documentation

## 2020-05-04 DIAGNOSIS — I447 Left bundle-branch block, unspecified: Secondary | ICD-10-CM | POA: Insufficient documentation

## 2020-05-04 DIAGNOSIS — G2581 Restless legs syndrome: Secondary | ICD-10-CM | POA: Insufficient documentation

## 2020-05-04 DIAGNOSIS — I10 Essential (primary) hypertension: Secondary | ICD-10-CM | POA: Insufficient documentation

## 2020-05-04 DIAGNOSIS — E118 Type 2 diabetes mellitus with unspecified complications: Secondary | ICD-10-CM | POA: Insufficient documentation

## 2020-05-04 DIAGNOSIS — Z794 Long term (current) use of insulin: Secondary | ICD-10-CM | POA: Insufficient documentation

## 2020-05-04 DIAGNOSIS — Z955 Presence of coronary angioplasty implant and graft: Secondary | ICD-10-CM | POA: Insufficient documentation

## 2020-05-04 DIAGNOSIS — Z79899 Other long term (current) drug therapy: Secondary | ICD-10-CM | POA: Insufficient documentation

## 2020-05-09 ENCOUNTER — Telehealth: Payer: Self-pay | Admitting: *Deleted

## 2020-05-09 ENCOUNTER — Encounter: Payer: Self-pay | Admitting: *Deleted

## 2020-05-09 DIAGNOSIS — Z955 Presence of coronary angioplasty implant and graft: Secondary | ICD-10-CM

## 2020-05-09 NOTE — Telephone Encounter (Signed)
Called to check in with patient and husband.  Left message on cell phone number.

## 2020-05-09 NOTE — Progress Notes (Signed)
Cardiac Individual Treatment Plan  Patient Details  Name: Denise Sharp MRN: [id number] Date of Birth: [birth year] Referring Provider:     Cardiac Rehab from 01/25/2020 in [facility] Cardiac and Pulmonary Rehab  Referring Provider Arida (End)      Initial Encounter Date:    Cardiac Rehab from 01/25/2020 in [facility] Cardiac and Pulmonary Rehab  Date 01/25/20      Visit Diagnosis: Status post coronary artery stent placement  Patient's Home Medications on Admission:  Current Outpatient Medications:  .  albuterol (VENTOLIN HFA) 108 (90 Base) MCG/ACT inhaler, Inhale 2 puffs into the lungs every 8 (eight) hours as needed for wheezing or shortness of breath., Disp: 8 g, Rfl: 2 .  aspirin EC 81 MG tablet, Take 1 tablet (81 mg total) by mouth daily., Disp: 30 tablet, Rfl: 2 .  atorvastatin (LIPITOR) 40 MG tablet, Take 1 tablet (40 mg total) by mouth daily., Disp: 90 tablet, Rfl: 0 .  benzonatate (TESSALON) 200 MG capsule, Take 1 capsule (200 mg total) by mouth 2 (two) times daily as needed for cough., Disp: 30 capsule, Rfl: 5 .  buPROPion (WELLBUTRIN) 75 MG tablet, TAKE 2 TABLETS BY MOUTH EVERY MORNING AND 1 TABLET IN THE AFTERNOON, Disp: 270 tablet, Rfl: 1 .  butalbital-acetaminophen-caffeine (FIORICET) 50-325-40 MG tablet, Take 1 tablet by mouth every 6 (six) hours as needed for headache., Disp: 90 tablet, Rfl: 5 .  citalopram (CELEXA) 10 MG tablet, Take 1 tablet (10 mg total) by mouth daily., Disp: 90 tablet, Rfl: 1 .  clopidogrel (PLAVIX) 75 MG tablet, Take 1 tablet (75 mg total) by mouth daily., Disp: 90 tablet, Rfl: 0 .  clotrimazole-betamethasone (LOTRISONE) cream, Apply 1 application topically 2 (two) times daily. (Patient taking differently: Apply 1 application topically 2 (two) times daily as needed (rash). ), Disp: 45 g, Rfl: 3 .  Continuous Blood Gluc Receiver (FREESTYLE LIBRE 14 DAY READER) DEVI, 1 applicator by Does not apply route 4 (four) times daily., Disp: 1 each, Rfl: 6 .  Continuous  Blood Gluc Sensor (FREESTYLE LIBRE 14 DAY SENSOR) MISC, Use to test blood sugar 4 times daily, Disp: 1 each, Rfl: 6 .  cyanocobalamin (,VITAMIN B-12,) 1000 MCG/ML injection, Inject 1 ml (1000 mcg ) IM weekly x 4,  Then monthly thereafter (Patient taking differently: Inject 1,000 mcg into the muscle every 30 (thirty) days. On the 12th day), Disp: 110 mL, Rfl: 0 .  dapagliflozin propanediol (FARXIGA) 5 MG TABS tablet, Take 1 tablet (5 mg total) by mouth daily before breakfast., Disp: 90 tablet, Rfl: 0 .  diazepam (VALIUM) 5 MG tablet, Take 1 tablet (5 mg total) by mouth at bedtime as needed for anxiety., Disp: 30 tablet, Rfl: 5 .  dicyclomine (BENTYL) 10 MG capsule, Take 1 capsule (10 mg total) by mouth 4 (four) times daily -  before meals and at bedtime., Disp: 360 capsule, Rfl: 3 .  diphenhydrAMINE (BENADRYL) 25 MG tablet, Take 50 mg by mouth daily as needed for allergies. , Disp: , Rfl:  .  diphenoxylate-atropine (LOMOTIL) 2.5-0.025 MG tablet, Take 1 tablet by mouth 4 (four) times daily as needed for diarrhea or loose stools., Disp: 60 tablet, Rfl: 5 .  DROPLET PEN NEEDLES 31G X 5 MM MISC, USE ONE NEEDLE SUBCUTANEOUSLY AS DIRECTED (REMOVE AND DISCARD NEEDLE IN SHARPS CONTAINER IMMEDIATELY AFTER USE), Disp: 100 each, Rfl: 1 .  Eszopiclone 3 MG TABS, Take 1 tablet (3 mg total) by mouth at bedtime., Disp: 90 tablet, Rfl: 1 .  hyoscyamine (  ANASPAZ) 0.125 MG TBDP disintergrating tablet, DISSOLVE UNDER TONGUE 15 MINUTES BEFORE MEALS, Disp: 360 tablet, Rfl: 1 .  Insulin Glargine (LANTUS SOLOSTAR) 100 UNIT/ML Solostar Pen, Inject 11 Units into the skin daily. (Patient taking differently: Inject 10-15 Units into the skin daily. ), Disp: 5 pen, Rfl: 3 .  ipratropium (ATROVENT) 0.06 % nasal spray, Place 2 sprays into both nostrils 4 (four) times daily., Disp: 15 mL, Rfl: 12 .  levETIRAcetam (KEPPRA) 750 MG tablet, Take 1 tablet (750 mg total) by mouth at bedtime. (Patient taking differently: Take 750 mg by mouth  at bedtime as needed (restless legs). ), Disp: 90 tablet, Rfl: 3 .  neomycin-polymyxin-hydrocortisone (CORTISPORIN) OTIC solution, Place 3 drops into both ears 4 (four) times daily. For otitis externa, Disp: 10 mL, Rfl: 0 .  nitroGLYCERIN (NITROSTAT) 0.4 MG SL tablet, Place 1 tablet (0.4 mg total) under the tongue every 5 (five) minutes as needed for chest pain. (Patient not taking: Reported on 03/15/2020), Disp: 20 tablet, Rfl: 1 .  NONFORMULARY OR COMPOUNDED ITEM, [facility]  Combination Pain Cream -  Baclofen 2%, Doxepin 5%, Gabapentin 6%, Topiramate 2%, Pentoxifylline 3% Apply 1-2 grams to affected area 3-4 times daily Qty. 120 gm 3 refills, Disp: 1 each, Rfl: 3 .  nystatin (NYAMYC) powder, Apply 1 g topically 4 (four) times daily as needed (rash).  (Patient not taking: Reported on 03/15/2020), Disp: , Rfl:  .  ondansetron (ZOFRAN ODT) 4 MG disintegrating tablet, Take 1 tablet (4 mg total) by mouth every 8 (eight) hours as needed for nausea or vomiting. (Patient not taking: Reported on 03/15/2020), Disp: 20 tablet, Rfl: 0 .  OVER THE COUNTER MEDICATION, Apply 1 application topically daily as needed (pain). [place], Disp: , Rfl:  .  pregabalin (LYRICA) 100 MG capsule, Take 100 mg by mouth at bedtime as needed (leg cramps). , Disp: , Rfl:  .  traMADol (ULTRAM) 50 MG tablet, Take 0.5 tablets (25 mg total) by mouth every 12 (twelve) hours as needed for severe pain., Disp: 30 tablet, Rfl: 0 .  umeclidinium-vilanterol (ANORO ELLIPTA) 62.5-25 MCG/INH AEPB, Inhale 1 puff into the lungs daily., Disp: 1 each, Rfl: 12  Past Medical History: Past Medical History:  Diagnosis Date  . Abnormality of gait 03/25/2013  . Adrenal mass, left ([place])   . Anemia    iron deficient post  2 unit txfsn 2009, normal endo/colonoscopy by [facility]  . Arthritis   . Atypical chest pain    a. 04/1986 Cath ([facility]): nl cors, EF 65%; b. 07/2012 Cath [facility]): LM nl, LAD/LCX/RCA w/ min irregs; c. 07/2016 MV [facility]): "Equivocal" w/  small inf wall rev defect and hyperdynamic LV contraction, EF 87%; d. 08/2016 Cardiac CT Ca2+ score [facility]): Ca2+ score 1548; e. 05/2019 MV: No ischemia. Diaph atten. EF 75%.  . Cervical spinal stenosis 1994   due to trauma to back ([employer] accident), has intermittent paralysis and parasthesias  . Cervicogenic headache 03/23/2014  . Depression   . Diastolic dysfunction    a. 12/2019 Echo: EF 65-70%, Gr2 DD. No significant valvular dzs.  . Dizziness    chronic dizziness  . DJD (degenerative joint disease)    a. Chronic R shoulder pain pending R shoulder replacement 07/2017.  [name] Esophageal stenosis March 2011   with transietn outlet obstruction by food, cleared by EGD   . Family history of adverse reaction to anesthesia    daughter PONV  . Gastric bypass status for obesity   . Headache(784.0)   .  Hypertension   . IBS (irritable bowel syndrome)   . Left bundle branch block (LBBB)    a. Intermittently present - likely rate related. - patient denies  . Obesity   . Obstructive sleep apnea    using CPAP  . 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 Restless leg syndrome   . Rotator cuff arthropathy, right 08/13/2017  . Syncope and collapse 03/12/2014  . Type II diabetes mellitus (HCC)     Tobacco Use: Social History   Tobacco Use  Smoking Status Never Smoker  Smokeless Tobacco Never Used    Labs: Recent Review Flowsheet Data    Labs for ITP Cardiac and Pulmonary Rehab Latest Ref Rng & Units 08/14/2018 03/06/2019 06/09/2019 12/31/2019 01/25/2020   Cholestrol 0 - 200 mg/dL - - - 183 -   LDLCALC 0 - 99 mg/dL - - - 113(H) -   LDLDIRECT mg/dL - - - - -   HDL >40 mg/dL - - - 42 -   Trlycerides <150 mg/dL - - - 140 -   Hemoglobin A1c 4.8 - 5.6 % 6.5 7.4(H) 7.2(H) 7.7(H) 7.6(H)   TCO2 22 - 32 mmol/L - - - - -       Exercise Target Goals: Exercise Program Goal: Individual exercise prescription set using results from initial 6 min walk test and THRR while considering  patient's activity  barriers and safety.   Exercise Prescription Goal: Initial exercise prescription builds to 30-45 minutes a day of aerobic activity, 2-3 days per week.  Home exercise guidelines will be given to patient during program as part of exercise prescription that the participant will acknowledge.   Education: Aerobic Exercise & Resistance Training: - Gives group verbal and written instruction on the various components of exercise. Focuses on aerobic and resistive training programs and the benefits of this training and how to safely progress through these programs..   Cardiac Rehab from 03/09/2020 in [facility] Cardiac and Pulmonary Rehab  Date 03/09/20  Educator AS  Instruction Review Code 1- Verbalizes Understanding      Education: Exercise & Equipment Safety: - Individual verbal instruction and demonstration of equipment use and safety with use of the equipment.   Cardiac Rehab from 03/09/2020 in [facility] Cardiac and Pulmonary Rehab  Date 01/25/20  Educator AS  Instruction Review Code 1- Verbalizes Understanding      Education: Exercise Physiology & General Exercise Guidelines: - Group verbal and written instruction with models to review the exercise physiology of the cardiovascular system and associated critical values. Provides general exercise guidelines with specific guidelines to those with heart or lung disease.    Cardiac Rehab from 03/09/2020 in [facility] Cardiac and Pulmonary Rehab  Date 01/27/20  Educator AS  Instruction Review Code 1- Verbalizes Understanding      Education: Flexibility, Balance, Mind/Body Relaxation: Provides group verbal/written instruction on the benefits of flexibility and balance training, including mind/body exercise modes such as yoga, pilates and tai chi.  Demonstration and skill practice provided.   Activity Barriers & Risk Stratification:  Activity Barriers & Cardiac Risk Stratification - 01/15/20 1411      Activity Barriers & Cardiac Risk Stratification    Activity Barriers Arthritis;Back Problems;Muscular Weakness;Neck/Spine Problems;Left Knee Replacement;Right Knee Replacement;Balance Concerns;Assistive Device;Joint Problems    Cardiac Risk Stratification High           6 Minute Walk:  6 Minute Walk    Row Name 01/25/20 1438         6 Minute Walk   Phase Initial  Distance 400 feet     Walk Time 5.5 minutes     # of Rest Breaks 0     MPH 0.75     METS 0.5     RPE 15     Perceived Dyspnea  2     VO2 Peak 1.81     Symptoms Yes (comment)     Comments R hip/buttock 3/10     Resting HR 73 bpm     Resting BP 108/68     Resting Oxygen Saturation  98 %     Exercise Oxygen Saturation  during 6 min walk 97 %     Max Ex. HR 102 bpm     Max Ex. BP 146/60     2 Minute Post BP 130/68            Oxygen Initial Assessment:   Oxygen Re-Evaluation:   Oxygen Discharge (Final Oxygen Re-Evaluation):   Initial Exercise Prescription:  Initial Exercise Prescription - 01/25/20 1400      Date of Initial Exercise RX and Referring Provider   Date 01/25/20    Referring Provider Arida (End)      Recumbant Bike   Level 1    RPM 60    Minutes 15      NuStep   Level 1    SPM 80    Minutes 15      Arm Ergometer   Level 1    RPM 25    Minutes 15      Prescription Details   Frequency (times per week) 3    Duration Progress to 30 minutes of continuous aerobic without signs/symptoms of physical distress      Intensity   THRR 40-80% of Max Heartrate 102-131    Ratings of Perceived Exertion 11-15    Perceived Dyspnea 0-4      Resistance Training   Training Prescription Yes    Weight 3 lb    Reps 10-15           Perform Capillary Blood Glucose checks as needed.  Exercise Prescription Changes:  Exercise Prescription Changes    Row Name 01/25/20 1400 02/09/20 1500 02/23/20 1500 03/09/20 1300 04/07/20 0800     Response to Exercise   Blood Pressure (Admit) 108/68 122/70 126/70 122/82 130/62   Blood Pressure  (Exercise) 146/66 130/68 132/68 158/80 140/64   Blood Pressure (Exit) 130/68 122/60 118/70 138/80 126/70   Heart Rate (Admit) 73 bpm 92 bpm 88 bpm 91 bpm 87 bpm   Heart Rate (Exercise) 102 bpm 99 bpm 126 bpm 85 bpm 97 bpm   Heart Rate (Exit) 74 bpm 90 bpm 86 bpm 75 bpm 77 bpm   Oxygen Saturation (Admit) 98 % -- -- -- --   Oxygen Saturation (Exercise) 97 % -- -- -- --   Rating of Perceived Exertion (Exercise) _0 Perceived Dyspnea (Exercise) 2 -- -- -- --   Symptoms R hip buttock 3/10 unable to do bike with hip -- none none   Duration -- Progress to 30 minutes of  aerobic without signs/symptoms of physical distress Progress to 30 minutes of  aerobic without signs/symptoms of physical distress Continue with 30 min of aerobic exercise without signs/symptoms of physical distress. Continue with 30 min of aerobic exercise without signs/symptoms of physical distress.   Intensity -- THRR unchanged THRR unchanged THRR unchanged THRR unchanged     Progression   Progression -- Continue to progress workloads to maintain intensity  without signs/symptoms of physical distress. Continue to progress workloads to maintain intensity without signs/symptoms of physical distress. Continue to progress workloads to maintain intensity without signs/symptoms of physical distress. Continue to progress workloads to maintain intensity without signs/symptoms of physical distress.   Average METs -- 1.8 2.5 1.6 1.4     Resistance Training   Training Prescription -- Yes Yes Yes Yes   Weight -- 3 lb 3 lb 3 lb 3 lb   Reps -- 10-15 10-15 10-15 10-15     Interval Training   Interval Training -- No No No No     Recumbant Bike   Level -- -- 1 -- --   RPM -- -- 60 -- --   Minutes -- -- 15 -- --     NuStep   Level -- _0 SPM -- -- 80 -- --   Minutes -- _1 METs -- 1.8 2.2 1.6 1.4     Arm Ergometer   Level -- 1 -- -- --   Minutes -- 15 -- -- --   METs -- 1.8 -- -- --     REL-XR   Level  -- -- -- 1 --   Minutes -- -- -- 30 --          Exercise Comments:   Exercise Goals and Review:  Exercise Goals    Row Name 01/25/20 1454             Exercise Goals   Increase Physical Activity Yes       Intervention Provide advice, education, support and counseling about physical activity/exercise needs.;Develop an individualized exercise prescription for aerobic and resistive training based on initial evaluation findings, risk stratification, comorbidities and participant's personal goals.       Expected Outcomes Short Term: Attend rehab on a regular basis to increase amount of physical activity.;Long Term: Add in home exercise to make exercise part of routine and to increase amount of physical activity.;Long Term: Exercising regularly at least 3-5 days a week.       Increase Strength and Stamina Yes       Intervention Provide advice, education, support and counseling about physical activity/exercise needs.;Develop an individualized exercise prescription for aerobic and resistive training based on initial evaluation findings, risk stratification, comorbidities and participant's personal goals.       Expected Outcomes Short Term: Increase workloads from initial exercise prescription for resistance, speed, and METs.;Short Term: Perform resistance training exercises routinely during rehab and add in resistance training at home;Long Term: Improve cardiorespiratory fitness, muscular endurance and strength as measured by increased METs and functional capacity (6MWT)       Able to understand and use rate of perceived exertion (RPE) scale Yes       Intervention Provide education and explanation on how to use RPE scale       Expected Outcomes Short Term: Able to use RPE daily in rehab to express subjective intensity level;Long Term:  Able to use RPE to guide intensity level when exercising independently       Knowledge and understanding of Target Heart Rate Range (THRR) Yes       Intervention  Provide education and explanation of THRR including how the numbers were predicted and where they are located for reference       Expected Outcomes Short Term: Able to state/look up THRR;Short Term: Able to use daily as guideline for intensity in rehab;Long Term: Able to use THRR to  govern intensity when exercising independently       Able to check pulse independently Yes       Intervention Provide education and demonstration on how to check pulse in carotid and radial arteries.;Review the importance of being able to check your own pulse for safety during independent exercise       Expected Outcomes Short Term: Able to explain why pulse checking is important during independent exercise;Long Term: Able to check pulse independently and accurately       Understanding of Exercise Prescription Yes       Intervention Provide education, explanation, and written materials on patient's individual exercise prescription       Expected Outcomes Short Term: Able to explain program exercise prescription;Long Term: Able to explain home exercise prescription to exercise independently              Exercise Goals Re-Evaluation :  Exercise Goals Re-Evaluation    Row Name 01/27/20 1421 02/09/20 1511 02/23/20 1522 03/07/20 1424 03/09/20 1354     Exercise Goal Re-Evaluation   Exercise Goals Review Increase Physical Activity;Able to understand and use rate of perceived exertion (RPE) scale;Knowledge and understanding of Target Heart Rate Range (THRR);Understanding of Exercise Prescription;Increase Strength and Stamina;Able to check pulse independently Increase Physical Activity;Increase Strength and Stamina;Understanding of Exercise Prescription Increase Physical Activity;Increase Strength and Stamina;Understanding of Exercise Prescription Increase Physical Activity;Increase Strength and Stamina;Understanding of Exercise Prescription Increase Physical Activity;Increase Strength and Stamina;Understanding of Exercise  Prescription   Comments Reviewed RPE and dyspnea scales, THR and program prescription with pt today.  Pt voiced understanding and was given a copy of goals to take home. Denise Sharp is off to a good start in rehab.  She is now up to 1.8 METs on the arm crank.  She was unable to do the bike, so we have rearranged her equipment to what she can use.  We will continue to monitor her progress. Denise Sharp tolerates exercise well and is able to work in the correct THR range.  Staff will monitor progress. Denise Sharp has only been able to walk outside program sessions.  She is very tired most days. Denise Sharp has been doing well in rehab. She is on level 2 for the T5.  She continues to shuffle her gait with short steps and not open to trying anything else to help.  We will continue to monitor her progress.   Expected Outcomes Short: Use RPE daily to regulate intensity. Long: Follow program prescription in THR. Short: Continue to attend regularly Long: Continue to improve stamina. Short: Continue to attend regularly Long: Continue to improve stamina. Short: talk with Dr about energy level Long:  improve stamina and breathing Short: Continue to work hard in rehab.  Long: Improve stamina   Row Name 04/07/20 0801 05/03/20 0840           Exercise Goal Re-Evaluation   Exercise Goals Review Increase Physical Activity;Increase Strength and Stamina;Understanding of Exercise Prescription --      Comments Denise Sharp has only attend once since last review due to her fall and her husband's cataract surgery. She was encouraged to walk and move at home as much as possible.  We will pick back up once she returns and continue to monitor her progress. Out sicne last review      Expected Outcomes Short: Return to regular exercise Long: Continue to improve stamina. --             Discharge Exercise Prescription (Final Exercise Prescription Changes):  Exercise Prescription  Changes - 04/07/20 0800      Response to Exercise   Blood Pressure (Admit)  130/62    Blood Pressure (Exercise) 140/64    Blood Pressure (Exit) 126/70    Heart Rate (Admit) 87 bpm    Heart Rate (Exercise) 97 bpm    Heart Rate (Exit) 77 bpm    Rating of Perceived Exertion (Exercise) 11    Symptoms none    Duration Continue with 30 min of aerobic exercise without signs/symptoms of physical distress.    Intensity THRR unchanged      Progression   Progression Continue to progress workloads to maintain intensity without signs/symptoms of physical distress.    Average METs 1.4      Resistance Training   Training Prescription Yes    Weight 3 lb    Reps 10-15      Interval Training   Interval Training No      NuStep   Level 4    Minutes 30    METs 1.4           Nutrition:  Target Goals: Understanding of nutrition guidelines, daily intake of sodium '1500mg'$ , cholesterol '200mg'$ , calories 30% from fat and 7% or less from saturated fats, daily to have 5 or more servings of fruits and vegetables.  Education: Controlling Sodium/Reading Food Labels -Group verbal and written material supporting the discussion of sodium use in heart healthy nutrition. Review and explanation with models, verbal and written materials for utilization of the food label.   Education: General Nutrition Guidelines/Fats and Fiber: -Group instruction provided by verbal, written material, models and posters to present the general guidelines for heart healthy nutrition. Gives an explanation and review of dietary fats and fiber.   Biometrics:  Pre Biometrics - 01/25/20 1455      Pre Biometrics   Height $Remov'5\' 5"'mrkIVb$  (1.651 m)    Weight 241 lb 3.2 oz (109.4 kg)    BMI (Calculated) 40.14            Nutrition Therapy Plan and Nutrition Goals:  Nutrition Therapy & Goals - 02/29/20 1643      Nutrition Therapy   Diet soft/liquid    Protein (specify units) 90g    Fiber 25 grams    Whole Grain Foods 3 servings    Saturated Fats 12 max. grams    Fruits and Vegetables 5 servings/day     Sodium 1.5 grams      Personal Nutrition Goals   Nutrition Goal ST: small frequent meals of liquids and soft foods. LT: Denise Sharp: increase energy and cannot perform ADLs like folding laundry - 3 years 3-6 months on bed rest each year. Build muscle.    Comments Pt primary diagnosis for Cardiac Rehab is S/P coronary artery stent placement. 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 Pt also presents with medulloadrenal hyperfunction, OSA on CPAP, dysphagia, and hearing loss. Pt has many conditions which could affect her mobility and ADLs including siatica, degenerative disc disease, arthropathy, and osteoarthrosis. Per family medicine pt has started a KETO diet enforced by daughter. Her husband,Tony, picks up food a lot. Shevonne reports that they don't eat much so they don't cook much and she is lethargic she feels like she cant even defrost broccoli (suggested frozen vegetables that are easy to prepare if restaurants do not have any). 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 Kajal reports getting nauseous and having reflux even with a bite of food and even sometimes saliva; encouraged pt to talk to her MD regarding this as less than 500kcal/day for 7-8 months can lead to malnutrition and other health issues - if she cannot do the surgery earlier maybe MD can provide tube feeds that bypasses the esophagus. Encouraged soft and liquid foods; pt reports not liking supplements - suggested at home protein shakes with nut butter and greek yogurt, proats (oats with egg whites and nut butter), mashed vegetables and beans, meatloaf, fish, steamed vegetables - pt was not receptive. Pt reports wanting a meal plan - will provide some meal ideas again, but expect with her lethargy and disinterest with suggestions this may not be helpful. Denise Sharp reports having IBS and  having 50-75 bowel movements per day even when NPO at the hospital as well as having a transit time so fast that after eating a salad, fully formed lettuce will be in her stool. Suspect that due to the little amount that she is eating her bowel movements are not as frequent, but frequent enough to be problematic. Suspect that if her transit time was that fast constantly for her whole life she would be malnourished and stuggle to maintain weight as well as meet her needs. Discussed heart healhty/ diabetes friendly eating - focused MNT for IBS and dysphagia.      Intervention Plan   Intervention Prescribe, educate and counsel regarding individualized specific dietary modifications aiming towards targeted core components such as weight, hypertension, lipid management, diabetes, heart failure and other comorbidities.;Nutrition handout(s) given to patient.    Expected Outcomes Short Term Goal: Understand basic principles of dietary content, such as calories, fat, sodium, cholesterol and nutrients.;Short Term Goal: A plan has been developed with personal nutrition goals set during dietitian appointment.;Long Term Goal: Adherence to prescribed nutrition plan.           Nutrition Assessments:  Nutrition Assessments - 01/25/20 1500      Rate Your Plate Scores   Pre Score 41           MEDIFICTS Score Key:          ?70 Need to make dietary changes          40-70 Heart Healthy Diet         ? 40 Therapeutic Level Cholesterol Diet  Nutrition Goals Re-Evaluation:   Nutrition Goals Discharge (Final Nutrition Goals Re-Evaluation):   Psychosocial: Target Goals: Acknowledge presence or absence of significant depression and/or stress, maximize coping skills, provide positive support system. Participant is able to verbalize types and ability to use techniques and skills needed for reducing stress and depression.   Education: Depression - Provides group verbal and written instruction on the correlation  between heart/lung disease and depressed mood, treatment options, and the stigmas associated with seeking treatment.   Cardiac Rehab from 03/09/2020 in [facility] Cardiac and Pulmonary Rehab  Date 02/17/20  Educator SB  Instruction Review Code 1- [employer] Understanding      Education: Sleep Hygiene -Provides group verbal and written instruction about how sleep can affect your health.  Define sleep hygiene, discuss sleep cycles and impact of sleep habits. Review good sleep hygiene tips.     Education: Stress and Anxiety: - Provides group verbal and written instruction about the health risks of elevated stress and causes of high stress.  Discuss the correlation between heart/lung disease and anxiety and treatment options. Review healthy ways  to manage with stress and anxiety.   Cardiac Rehab from 03/09/2020 in [facility] Cardiac and Pulmonary Rehab  Date 02/17/20  Educator SB  Instruction Review Code 1- Verbalizes Understanding       Initial Review & Psychosocial Screening:  Initial Psych Review & Screening - 01/15/20 1414      Initial Review   Current issues with Current Stress Concerns    Source of Stress Concerns Chronic Illness      Family Dynamics   Good Support System? Yes   husband, daughter     Barriers   Psychosocial barriers to participate in program There are no identifiable barriers or psychosocial needs.;The patient should benefit from training in stress management and relaxation.      Screening Interventions   Interventions Encouraged to exercise;To provide support and resources with identified psychosocial needs;Provide feedback about the scores to participant    Expected Outcomes Short Term goal: Utilizing psychosocial counselor, staff and physician to assist with identification of specific Stressors or current issues interfering with healing process. Setting desired goal for each stressor or current issue identified.;Long Term Goal: Stressors or current issues are controlled  or eliminated.;Short Term goal: Identification and review with participant of any Quality of Life or Depression concerns found by scoring the questionnaire.;Long Term goal: The participant improves quality of Life and PHQ9 Scores as seen by post scores and/or verbalization of changes           Quality of Life Scores:   Quality of Life - 01/25/20 1500      Quality of Life   Select Quality of Life      Quality of Life Scores   Health/Function Pre 14.47 %    Socioeconomic Pre 18.25 %    Psych/Spiritual Pre 23.43 %    Family Pre 26.4 %    GLOBAL Pre 18.86 %          Scores of 19 and below usually indicate a poorer quality of life in these areas.  A difference of  2-3 points is a clinically meaningful difference.  A difference of 2-3 points in the total score of the Quality of Life Index has been associated with significant improvement in overall quality of life, self-image, physical symptoms, and general health in studies assessing change in quality of life.  PHQ-9: Recent Review Flowsheet Data    Depression screen [facility] 2/9 02/24/2020 02/17/2020 01/25/2020 01/13/2020 06/24/2019   Decreased Interest 1 1 0 0 0   Down, Depressed, Hopeless 0 0 0 0 0   PHQ - 2 Score 1 1 0 0 0   Altered sleeping $RemoveBeforeDE'1 2 2  'eWGLOCqmKLVmJOf$ - -   Tired, decreased energy $RemoveBeforeDE'2 3 3  'bbBBvraOSOyuhBu$ - -   Change in appetite 2 2 0 - -   Feeling bad or failure about yourself  1 0 0 - -   Trouble concentrating 0 2 0 - -   Moving slowly or fidgety/restless 0 0 3 - -   Suicidal thoughts 0 0 0 - -   PHQ-9 Score $RemoveBef'7 10 8 'JaUvsmIcyh$ - -   Difficult doing work/chores Somewhat difficult Somewhat difficult Extremely dIfficult - -     Interpretation of Total Score  Total Score Depression Severity:  1-4 = Minimal depression, 5-9 = Mild depression, 10-14 = Moderate depression, 15-19 = Moderately severe depression, 20-27 = Severe depression   Psychosocial Evaluation and Intervention:  Psychosocial Evaluation - 01/15/20 1415      Psychosocial Evaluation & Interventions    Interventions Encouraged  to exercise with the program and follow exercise prescription    Comments Denise Sharp has a long history of health issues. She is curious to see what she can do during cardiac rehab given her history of mobility issues. She wants to improve her stamina and balance. She reports a good support system of her husband and her daughter.    Expected Outcomes Short: attend cardiac rehab for exercise and education. Long: develop positive self care habits.    Continue Psychosocial Services  Follow up required by staff           Psychosocial Re-Evaluation:  Psychosocial Re-Evaluation    [place] Name 02/17/20 1509 03/07/20 1421           Psychosocial Re-Evaluation   Current issues with Current Sleep Concerns;Current Stress Concerns Current Sleep Concerns;Current Stress Concerns      Comments Denise Sharp states she feels a little bit better since being able to get out and about. She still struggles with chronic insomnia. Her PHQ was reviewed by staff and will be repeated as it is still high. Denise Sharp has been sleepng better according to her Fitbit - between 6-8 hours a night.  She still feels tired and has no energy most days.  Staff recommended she talk with Dr about tiredness as well.      Expected Outcomes Short: attend cardiac rehab for educaion, exercise and something to do out of the house. Long: find a positive self care habit. Short:  continue to attend Cardiac Rehab and sleep well Long:  maintain good sleep      Continue Psychosocial Services  Follow up required by staff --             Psychosocial Discharge (Final Psychosocial Re-Evaluation):  Psychosocial Re-Evaluation - 03/07/20 1421      Psychosocial Re-Evaluation   Current issues with Current Sleep Concerns;Current Stress Concerns    Comments Denise Sharp has been sleepng better according to her Fitbit - between 6-8 hours a night.  She still feels tired and has no energy most days.  Staff recommended she talk with Dr about tiredness as  well.    Expected Outcomes Short:  continue to attend Cardiac Rehab and sleep well Long:  maintain good sleep           Vocational Rehabilitation: Provide vocational rehab assistance to qualifying candidates.   Vocational Rehab Evaluation & Intervention:  Vocational Rehab - 01/15/20 1414      Initial Vocational Rehab Evaluation & Intervention   Assessment shows need for Vocational Rehabilitation No           Education: Education Goals: Education classes will be provided on a variety of topics geared toward better understanding of heart health and risk factor modification. Participant will state understanding/return demonstration of topics presented as noted by education test scores.  Learning Barriers/Preferences:  Learning Barriers/Preferences - 01/15/20 1414      Learning Barriers/Preferences   Learning Barriers None    Learning Preferences None           General Cardiac Education Topics:  AED/CPR: - Group verbal and written instruction with the use of models to demonstrate the basic use of the AED with the basic ABC's of resuscitation.   Anatomy & Physiology of the Heart: - Group verbal and written instruction and models provide basic cardiac anatomy and physiology, with the coronary electrical and arterial systems. Review of Valvular disease and Heart Failure   Cardiac Procedures: - Group verbal and written instruction to review commonly prescribed  medications for heart disease. Reviews the medication, class of the drug, and side effects. Includes the steps to properly store meds and maintain the prescription regimen. (beta blockers and nitrates)   Cardiac Rehab from 03/09/2020 in [facility] Cardiac and Pulmonary Rehab  Date 03/09/20  Educator [facility]  Instruction Review Code 1- Verbalizes Understanding      Cardiac Medications I: - Group verbal and written instruction to review commonly prescribed medications for heart disease. Reviews the medication, class of the drug,  and side effects. Includes the steps to properly store meds and maintain the prescription regimen.   Cardiac Medications II: -Group verbal and written instruction to review commonly prescribed medications for heart disease. Reviews the medication, class of the drug, and side effects. (all other drug classes)   Cardiac Rehab from 03/09/2020 in [facility] Cardiac and Pulmonary Rehab  Date 02/24/20  Educator SB  Instruction Review Code 1- Verbalizes Understanding       Go Sex-Intimacy & Heart Disease, Get SMART - Goal Setting: - Group verbal and written instruction through game format to discuss heart disease and the return to sexual intimacy. Provides group verbal and written material to discuss and apply goal setting through the application of the S.M.A.R.T. Method.   Cardiac Rehab from 03/09/2020 in [facility] Cardiac and Pulmonary Rehab  Date 03/09/20  Educator [facility]  Instruction Review Code 1- Verbalizes Understanding      Other Matters of the Heart: - Provides group verbal, written materials and models to describe Stable Angina and Peripheral Artery. Includes description of the disease process and treatment options available to the cardiac patient.   Infection Prevention: - Provides verbal and written material to individual with discussion of infection control including proper hand washing and proper equipment cleaning during exercise session.   Cardiac Rehab from 03/09/2020 in [facility] Cardiac and Pulmonary Rehab  Date 01/25/20  Educator AS  Instruction Review Code 1- Verbalizes Understanding      Falls Prevention: - Provides verbal and written material to individual with discussion of falls prevention and safety.   Cardiac Rehab from 03/09/2020 in [facility] Cardiac and Pulmonary Rehab  Date 01/25/20  Educator AS  Instruction Review Code 1- Verbalizes Understanding      Other: -Provides group and verbal instruction on various topics (see comments)   Knowledge Questionnaire Score:  Knowledge  Questionnaire Score - 01/25/20 1500      Knowledge Questionnaire Score   Pre Score 21/26           Core Components/Risk Factors/Patient Goals at Admission:  Personal Goals and Risk Factors at Admission - 01/25/20 1504      Core Components/Risk Factors/Patient Goals on Admission    Weight Management Yes    Intervention Weight Management: Develop a combined nutrition and exercise program designed to reach desired caloric intake, while maintaining appropriate intake of nutrient and fiber, sodium and fats, and appropriate energy expenditure required for the weight goal.;Weight Management: Provide education and appropriate resources to help participant work on and attain dietary goals.;Weight Management/Obesity: Establish reasonable short term and long term weight goals.    Admit Weight 241 lb 3.2 oz (109.4 kg)    Goal Weight: Short Term 235 lb (106.6 kg)    Goal Weight: Long Term 220 lb (99.8 kg)    Expected Outcomes Short Term: Continue to assess and modify interventions until short term weight is achieved;Long Term: Adherence to nutrition and physical activity/exercise program aimed toward attainment of established weight goal;Weight Loss: Understanding of general recommendations for a balanced  deficit meal plan, which promotes 1-2 lb weight loss per week and includes a negative energy balance of [phone] kcal/d;Understanding recommendations for meals to include 15-35% energy as protein, 25-35% energy from fat, 35-60% energy from carbohydrates, less than $RemoveB'200mg'QwDYkiDx$  of dietary cholesterol, 20-35 gm of total fiber daily;Understanding of distribution of calorie intake throughout the day with the consumption of 4-5 meals/snacks    Intervention Provide education about signs/symptoms and action to take for hypo/hyperglycemia.;Provide education about proper nutrition, including hydration, and aerobic/resistive exercise prescription along with prescribed medications to achieve blood glucose in normal ranges:  Fasting glucose 65-99 mg/dL    Expected Outcomes Short Term: Participant verbalizes understanding of the signs/symptoms and immediate care of hyper/hypoglycemia, proper foot care and importance of medication, aerobic/resistive exercise and nutrition plan for blood glucose control.;Long Term: Attainment of HbA1C < 7%.    Intervention Provide education on lifestyle modifcations including regular physical activity/exercise, weight management, moderate sodium restriction and increased consumption of fresh fruit, vegetables, and low fat dairy, alcohol moderation, and smoking cessation.;Monitor prescription use compliance.    Expected Outcomes Short Term: Continued assessment and intervention until BP is < 140/76mm HG in hypertensive participants. < 130/52mm HG in hypertensive participants with diabetes, heart failure or chronic kidney disease.;Long Term: Maintenance of blood pressure at goal levels.           Education:Diabetes - Individual verbal and written instruction to review signs/symptoms of diabetes, desired ranges of glucose level fasting, after meals and with exercise. Acknowledge that pre and post exercise glucose checks will be done for 3 sessions at entry of program.   Cardiac Rehab from 01/15/2020 in [facility] Cardiac and Pulmonary Rehab  Date 01/15/20  Educator [facility]  Instruction Review Code 1- [employer] Understanding      Education: Know Your Numbers and Risk Factors: -Group verbal and written instruction about important numbers in your health.  Discussion of what are risk factors and how they play a role in the disease process.  Review of Cholesterol, Blood Pressure, Diabetes, and BMI and the role they play in your overall health.   Cardiac Rehab from 03/09/2020 in [facility] Cardiac and Pulmonary Rehab  Date 02/24/20  Educator SB  Instruction Review Code 1- Verbalizes Understanding      Core Components/Risk Factors/Patient Goals Review:   Goals and Risk Factor Review    Row Name 02/17/20  1454 03/07/20 1418           Core Components/Risk Factors/Patient Goals Review   Personal Goals Review Diabetes;Weight Management/Obesity;Hypertension Diabetes;Weight Management/Obesity;Hypertension      Review Education given on new glucose monitor. Diabetic safety reviewed with patient. 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    Denise Sharp has been checking BG at home it has been out of range at times.  She has appt with Dr [name] Thursday  about this.      Expected Outcomes Short: keep log of blood sugar. 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    Denise Sharp has been checking BG at home it has been out of range at times.  She has appt with Dr [name] Thursday  about this.    Expected Outcomes Short: follow up with Dr about BG  Long: manage risk factors and other health problems           ITP Comments:  ITP Comments    Row Name 01/15/20 1430 01/25/20 1507 01/27/20 1054 01/27/20 1419 02/24/20 0641   ITP Comments Initial telephone orientation completed. Diagnosis can be found in [facility] 6/16. EP orientation scheduled for 7/12 at 1pm Completed 6MWT and gym orientation. Initial ITP created and sent for review to Dr. Emily Filbert, Medical Director. 30 Day review completed. Medical Director ITP review done, changes made as directed, and signed approval by Medical Director. First full day of exercise!  Patient was oriented to gym and equipment including  functions, settings, policies, and procedures.  Patient's individual exercise prescription and treatment plan were reviewed.  All starting workloads were established based on the results of the 6 minute walk test done at initial orientation visit.  The plan for exercise progression was also introduced and progression will be customized based on patient's performance and goals. 30 Day review completed. Medical Director ITP review done, changes made as directed, and signed approval by Medical Director.   [place] Name 03/23/20 1625 04/07/20 0801 04/18/20 1720 04/20/20 [id number] 05/03/20 0839   ITP Comments 30 day review completed. ITP sent to Dr. Emily Filbert, Medical Director of Cardiac and Pulmonary Rehab. Continue with ITP unless changes are made by physician. Liliah is out from her fall and her husband having surgery. Amarachi has not attended since last review. 30 Day review completed. Medical Director ITP review done, changes made as directed, and signed approval by Medical Director. Denise Sharp has been out with Tony's (her husband) cataract surgery.  Left message about when they plan to return yesterday.  Last visit was 03/28/2020.   [place] Name 05/09/20 1420 05/09/20 1531         ITP Comments Called to check in with patient and husband.  Left message on cell phone number. Pt returned call.  She has an upcoming cardiology appt to see if she can do dental surgery.  Her husband is also needing knee surgery.  She would like to discharge at this time.             Comments: Discharge ITP

## 2020-05-09 NOTE — Progress Notes (Signed)
Discharge Progress Report  Patient Details  Name: Denise Sharp MRN: [id number] Date of Birth: [birth year] Referring Provider:     Cardiac Rehab from 01/25/2020 in [facility]  Referring Provider Arida (End)       Number of Visits: 18  Reason for Discharge:  Early Exit:  Personal and Lack of attendance  Smoking History:  Social History   Tobacco Use  Smoking Status Never Smoker  Smokeless Tobacco Never Used    Diagnosis:  Status post coronary artery stent placement  ADL [facility]SD:   Initial Exercise Prescription:  Initial Exercise Prescription - 01/25/20 1400      Date of Initial Exercise RX and Referring Provider   Date 01/25/20    Referring Provider Arida (End)      Recumbant Bike   Level 1    RPM 60    Minutes 15      NuStep   Level 1    SPM 80    Minutes 15      Arm Ergometer   Level 1    RPM 25    Minutes 15      Prescription Details   Frequency (times per week) 3    Duration Progress to 30 minutes of continuous aerobic without signs/symptoms of physical distress      Intensity   THRR 40-80% of Max Heartrate 102-131    Ratings of Perceived Exertion 11-15    Perceived Dyspnea 0-4      Resistance Training   Training Prescription Yes    Weight 3 lb    Reps 10-15           Discharge Exercise Prescription (Final Exercise Prescription Changes):  Exercise Prescription Changes - 04/07/20 0800      Response to Exercise   Blood Pressure (Admit) 130/62    Blood Pressure (Exercise) 140/64    Blood Pressure (Exit) 126/70    Heart Rate (Admit) 87 bpm    Heart Rate (Exercise) 97 bpm    Heart Rate (Exit) 77 bpm    Rating of Perceived Exertion (Exercise) 11    Symptoms none    Duration Continue with 30 min of aerobic exercise without signs/symptoms of physical distress.    Intensity THRR unchanged      Progression   Progression Continue to progress workloads to maintain intensity without signs/symptoms of physical distress.     Average METs 1.4      Resistance Training   Training Prescription Yes    Weight 3 lb    Reps 10-15      Interval Training   Interval Training No      NuStep   Level 4    Minutes 30    METs 1.4           Functional Capacity:  6 Minute Walk    Row Name 01/25/20 1438         6 Minute Walk   Phase Initial     Distance 400 feet     Walk Time 5.5 minutes     # of Rest Breaks 0     MPH 0.75     METS 0.5     RPE 15     Perceived Dyspnea  2     VO2 Peak 1.81     Symptoms Yes (comment)     Comments R hip/buttock 3/10     Resting HR 73 bpm     Resting BP 108/68     Resting Oxygen  Saturation  98 %     Exercise Oxygen Saturation  during 6 min walk 97 %     Max Ex. HR 102 bpm     Max Ex. BP 146/60     2 Minute Post BP 130/68            Psychological, QOL, Others - Outcomes: PHQ 2/9: Depression screen [facility] 2/9 02/24/2020 02/17/2020 01/25/2020 01/13/2020 06/24/2019  Decreased Interest 1 1 0 0 0  Down, Depressed, Hopeless 0 0 0 0 0  PHQ - 2 Score 1 1 0 0 0  Altered sleeping 1 2 2  - -  Tired, decreased energy 2 3 3  - -  Change in appetite 2 2 0 - -  Feeling bad or failure about yourself  1 0 0 - -  Trouble concentrating 0 2 0 - -  Moving slowly or fidgety/restless 0 0 3 - -  Suicidal thoughts 0 0 0 - -  PHQ-9 Score 7 10 8  - -  Difficult doing work/chores Somewhat difficult Somewhat difficult Extremely dIfficult - -  Some recent data might be hidden    Quality of Life:  Quality of Life - 01/25/20 1500      Quality of Life   Select Quality of Life      Quality of Life Scores   Health/Function Pre 14.47 %    Socioeconomic Pre 18.25 %    Psych/Spiritual Pre 23.43 %    Family Pre 26.4 %    GLOBAL Pre 18.86 %           Personal Goals: Goals established at orientation with interventions provided to work toward goal.  Personal Goals and Risk Factors at Admission - 01/25/20 1504      Core Components/Risk Factors/Patient Goals on Admission    Weight Management  Yes    Intervention Weight Management: Develop a combined nutrition and exercise program designed to reach desired caloric intake, while maintaining appropriate intake of nutrient and fiber, sodium and fats, and appropriate energy expenditure required for the weight goal.;Weight Management: Provide education and appropriate resources to help participant work on and attain dietary goals.;Weight Management/Obesity: Establish reasonable short term and long term weight goals.    Admit Weight 241 lb 3.2 oz (109.4 kg)    Goal Weight: Short Term 235 lb (106.6 kg)    Goal Weight: Long Term 220 lb (99.8 kg)    Expected Outcomes Short Term: Continue to assess and modify interventions until short term weight is achieved;Long Term: Adherence to nutrition and physical activity/exercise program aimed toward attainment of established weight goal;Weight Loss: Understanding of general recommendations for a balanced deficit meal plan, which promotes 1-2 lb weight loss per week and includes a negative energy balance of [phone] kcal/d;Understanding recommendations for meals to include 15-35% energy as protein, 25-35% energy from fat, 35-60% energy from carbohydrates, less than 200mg  of dietary cholesterol, 20-35 gm of total fiber daily;Understanding of distribution of calorie intake throughout the day with the consumption of 4-5 meals/snacks    Intervention Provide education about signs/symptoms and action to take for hypo/hyperglycemia.;Provide education about proper nutrition, including hydration, and aerobic/resistive exercise prescription along with prescribed medications to achieve blood glucose in normal ranges: Fasting glucose 65-99 mg/dL    Expected Outcomes Short Term: Participant verbalizes understanding of the signs/symptoms and immediate care of hyper/hypoglycemia, proper foot care and importance of medication, aerobic/resistive exercise and nutrition plan for blood glucose control.;Long Term: Attainment of HbA1C <  7%.    Intervention Provide education  on lifestyle modifcations including regular physical activity/exercise, weight management, moderate sodium restriction and increased consumption of fresh fruit, vegetables, and low fat dairy, alcohol moderation, and smoking cessation.;Monitor prescription use compliance.    Expected Outcomes Short Term: Continued assessment and intervention until BP is < 140/3mm HG in hypertensive participants. < 130/34mm HG in hypertensive participants with diabetes, heart failure or chronic kidney disease.;Long Term: Maintenance of blood pressure at goal levels.            Personal Goals Discharge:  Goals and Risk Factor Review    Row Name 02/17/20 1454 03/07/20 1418           Core Components/Risk Factors/Patient Goals Review   Personal Goals Review Diabetes;Weight Management/Obesity;Hypertension Diabetes;Weight Management/Obesity;Hypertension      Review Education given on new glucose monitor. Diabetic safety reviewed with patient. 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    Miyuki has been checking BG at home it has been out of range at times.  She has appt with Dr [name] Thursday  about this.      Expected Outcomes Short: keep log of blood sugar. Long: maintain independence in management of diabetes Short: follow up with Dr about BG  Long: manage risk factors and other health problems             Exercise Goals and Review:  Exercise Goals    Row Name 01/25/20 1454             Exercise Goals   Increase Physical Activity Yes       Intervention Provide advice, education, support and counseling about physical activity/exercise needs.;Develop an individualized exercise prescription for aerobic and resistive training based on initial evaluation findings, risk stratification, comorbidities and participant's personal goals.       Expected Outcomes Short Term: Attend rehab on a regular basis  to increase amount of physical activity.;Long Term: Add in home exercise to make exercise part of routine and to increase amount of physical activity.;Long Term: Exercising regularly at least 3-5 days a week.       Increase Strength and Stamina Yes       Intervention Provide advice, education, support and counseling about physical activity/exercise needs.;Develop an individualized exercise prescription for aerobic and resistive training based on initial evaluation findings, risk stratification, comorbidities and participant's personal goals.       Expected Outcomes Short Term: Increase workloads from initial exercise prescription for resistance, speed, and METs.;Short Term: Perform resistance training exercises routinely during rehab and add in resistance training at home;Long Term: Improve cardiorespiratory fitness, muscular endurance and strength as measured by increased METs and functional capacity (6MWT)       Able to understand and use rate of perceived exertion (RPE) scale Yes       Intervention Provide education and explanation on how to use RPE scale       Expected Outcomes Short Term: Able to use RPE daily in rehab to express subjective intensity level;Long Term:  Able to use RPE to guide intensity level when exercising independently       Knowledge and understanding of Target Heart Rate Range (THRR) Yes       Intervention Provide education and explanation of THRR including how the numbers were predicted and where they are located for reference       Expected Outcomes Short Term: Able to state/look up THRR;Short Term: Able to use daily as guideline for intensity  in rehab;Long Term: Able to use THRR to govern intensity when exercising independently       Able to check pulse independently Yes       Intervention Provide education and demonstration on how to check pulse in carotid and radial arteries.;Review the importance of being able to check your own pulse for safety during independent exercise        Expected Outcomes Short Term: Able to explain why pulse checking is important during independent exercise;Long Term: Able to check pulse independently and accurately       Understanding of Exercise Prescription Yes       Intervention Provide education, explanation, and written materials on patient's individual exercise prescription       Expected Outcomes Short Term: Able to explain program exercise prescription;Long Term: Able to explain home exercise prescription to exercise independently              Exercise Goals Re-Evaluation:  Exercise Goals Re-Evaluation    Row Name 01/27/20 1421 02/09/20 1511 02/23/20 1522 03/07/20 1424 03/09/20 1354     Exercise Goal Re-Evaluation   Exercise Goals Review Increase Physical Activity;Able to understand and use rate of perceived exertion (RPE) scale;Knowledge and understanding of Target Heart Rate Range (THRR);Understanding of Exercise Prescription;Increase Strength and Stamina;Able to check pulse independently Increase Physical Activity;Increase Strength and Stamina;Understanding of Exercise Prescription Increase Physical Activity;Increase Strength and Stamina;Understanding of Exercise Prescription Increase Physical Activity;Increase Strength and Stamina;Understanding of Exercise Prescription Increase Physical Activity;Increase Strength and Stamina;Understanding of Exercise Prescription   Comments Reviewed RPE and dyspnea scales, THR and program prescription with pt today.  Pt voiced understanding and was given a copy of goals to take home. Meleana is off to a good start in rehab.  She is now up to 1.8 METs on the arm crank.  She was unable to do the bike, so we have rearranged her equipment to what she can use.  We will continue to monitor her progress. Simi tolerates exercise well and is able to work in the correct THR range.  Staff will monitor progress. Maisey has only been able to walk outside program sessions.  She is very tired most days. Aviya has  been doing well in rehab. She is on level 2 for the T5.  She continues to shuffle her gait with short steps and not open to trying anything else to help.  We will continue to monitor her progress.   Expected Outcomes Short: Use RPE daily to regulate intensity. Long: Follow program prescription in THR. Short: Continue to attend regularly Long: Continue to improve stamina. Short: Continue to attend regularly Long: Continue to improve stamina. Short: talk with Dr about energy level Long:  improve stamina and breathing Short: Continue to work hard in rehab.  Long: Improve stamina   Row Name 04/07/20 0801 05/03/20 0840           Exercise Goal Re-Evaluation   Exercise Goals Review Increase Physical Activity;Increase Strength and Stamina;Understanding of Exercise Prescription --      Comments Tharon has only attend once since last review due to her fall and her husband's cataract surgery. She was encouraged to walk and move at home as much as possible.  We will pick back up once she returns and continue to monitor her progress. Out sicne last review      Expected Outcomes Short: Return to regular exercise Long: Continue to improve stamina. --             Nutrition &  Weight - Outcomes:  Pre Biometrics - 01/25/20 1455      Pre Biometrics   Height 5\' 5"  (1.651 m)    Weight 241 lb 3.2 oz (109.4 kg)    BMI (Calculated) 40.14            Nutrition:  Nutrition Therapy & Goals - 02/29/20 1643      Nutrition Therapy   Diet soft/liquid    Protein (specify units) 90g    Fiber 25 grams    Whole Grain Foods 3 servings    Saturated Fats 12 max. grams    Fruits and Vegetables 5 servings/day    Sodium 1.5 grams      Personal Nutrition Goals   Nutrition Goal ST: small frequent meals of liquids and soft foods. LT: Joellyn: increase energy and cannot perform ADLs like folding laundry - 3 years 3-6 months on bed rest each year. Build muscle.    Comments Pt primary diagnosis for Cardiac Rehab is S/P  coronary artery stent placement. 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 Pt also presents with medulloadrenal hyperfunction, OSA on CPAP, dysphagia, and hearing loss. Pt has many conditions which could affect her mobility and ADLs including siatica, degenerative disc disease, arthropathy, and osteoarthrosis. Per family medicine pt has started a KETO diet enforced by daughter. Her husband,Tony, picks up food a lot. Cruz reports that they don't eat much so they don't cook much and she is lethargic she feels like she cant even defrost broccoli (suggested frozen vegetables that are easy to prepare if restaurants do not have any). 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 Rionna reports getting nauseous and having reflux even with a bite of food and even sometimes saliva; encouraged pt to talk to her MD regarding this as less than 500kcal/day for 7-8 months can lead to malnutrition and other health issues - if she cannot do the surgery earlier maybe MD can provide tube feeds that bypasses the esophagus. Encouraged soft and liquid foods; pt reports not liking supplements - suggested at home protein shakes with nut butter and greek yogurt, proats (oats with egg whites and nut butter), mashed vegetables and beans, meatloaf, fish, steamed vegetables - pt was not receptive. Pt reports wanting a meal plan - will provide some meal ideas again, but expect with her lethargy and disinterest with suggestions this may not be helpful. Lakeyta reports having IBS and having 50-75 bowel movements per day even when NPO at the hospital as well as having a transit time so fast that after eating a salad, fully formed lettuce will be in her stool. Suspect that due to the little amount that she is eating her bowel movements are not as frequent, but frequent enough to be  problematic. Suspect that if her transit time was that fast constantly for her whole life she would be malnourished and stuggle to maintain weight as well as meet her needs. Discussed heart healhty/ diabetes friendly eating - focused MNT for IBS and dysphagia.      Intervention Plan   Intervention Prescribe, educate and counsel regarding individualized specific dietary modifications aiming towards targeted core components such as weight, hypertension, lipid management, diabetes, heart failure and other comorbidities.;Nutrition handout(s) given to patient.    Expected Outcomes Short Term Goal: Understand basic principles of dietary content, such as calories, fat, sodium,  cholesterol and nutrients.;Short Term Goal: A plan has been developed with personal nutrition goals set during dietitian appointment.;Long Term Goal: Adherence to prescribed nutrition plan.           Nutrition Discharge:  Nutrition Assessments - 01/25/20 1500      Rate Your Plate Scores   Pre Score 41           Education Questionnaire Score:  Knowledge Questionnaire Score - 01/25/20 1500      Knowledge Questionnaire Score   Pre Score 21/26           Goals reviewed with patient; copy given to patient.

## 2020-05-10 ENCOUNTER — Telehealth: Payer: Self-pay | Admitting: Internal Medicine

## 2020-05-10 NOTE — Telephone Encounter (Signed)
   Primary Cardiologist: [name], MD  Chart reviewed as part of pre-operative protocol coverage.   Simple dental extractions are considered low risk procedures per guidelines and generally do not require any specific cardiac clearance. It is also generally accepted that for simple extractions and dental cleanings, there is no need to interrupt blood thinner therapy. Patient had cardiac stent placed in 12/2019. Uninterrupted dual antiplatelet therapy recommended for at least 6 months. Therefore, Plavix and Aspirin should be continued.  SBE prophylaxis not required for the patient from a cardiac standpoint.  I will route this recommendation to the requesting party via Epic fax function and remove from pre-op pool.  Please call with questions.  Darreld Mclean, PA-C 05/10/2020, 10:03 AM

## 2020-05-10 NOTE — Telephone Encounter (Signed)
   [facility] Pre-operative Risk Assessment    HEARTCARE STAFF: - Please ensure there is not already an duplicate clearance open for this procedure. - Under Visit Info/Reason for Call, type in Other and utilize the format Clearance MM/DD/YY or Clearance TBD. Do not use dashes or single digits. - If request is for dental extraction, please clarify the # of teeth to be extracted.  Request for surgical clearance:  1. What type of surgery is being performed? EXTRACTIONS 2 TEETH #22, #27  2. When is this surgery scheduled? TBD  3. What type of clearance is required (medical clearance vs. Pharmacy clearance to hold med vs. Both)? BOTH, PATIENT IS ON PLAVIX, BUT DOES NOT NEED TO BE HELD FOR 2 EXTRACTIONS  4. Are there any medications that need to be held prior to surgery and how long? PLAVIX, BUT NEEDS ADVICE  Practice name and name of physician performing surgery? [facility], DR. Lewanda Rife 5. What is the office phone number? [phone]   7.   What is the office fax number? [phone]   8.   Anesthesia type (None, local, MAC, general) ? LOCAL   Denise Sharp 05/10/2020, 9:33 AM  _________________________________________________________________   (provider comments below)

## 2020-05-18 ENCOUNTER — Encounter: Payer: Self-pay | Admitting: Internal Medicine

## 2020-05-18 ENCOUNTER — Other Ambulatory Visit: Payer: Self-pay

## 2020-05-18 ENCOUNTER — Ambulatory Visit (INDEPENDENT_AMBULATORY_CARE_PROVIDER_SITE_OTHER): Payer: Medicare Other | Admitting: Internal Medicine

## 2020-05-18 VITALS — BP 124/70 | HR 72 | Ht 64.5 in | Wt 249.0 lb

## 2020-05-18 DIAGNOSIS — R0600 Dyspnea, unspecified: Secondary | ICD-10-CM

## 2020-05-18 DIAGNOSIS — I2510 Atherosclerotic heart disease of native coronary artery without angina pectoris: Secondary | ICD-10-CM | POA: Diagnosis not present

## 2020-05-18 DIAGNOSIS — Z01810 Encounter for preprocedural cardiovascular examination: Secondary | ICD-10-CM

## 2020-05-18 DIAGNOSIS — I5032 Chronic diastolic (congestive) heart failure: Secondary | ICD-10-CM | POA: Diagnosis not present

## 2020-05-18 DIAGNOSIS — R0609 Other forms of dyspnea: Secondary | ICD-10-CM

## 2020-05-18 DIAGNOSIS — E785 Hyperlipidemia, unspecified: Secondary | ICD-10-CM | POA: Diagnosis not present

## 2020-05-18 NOTE — Patient Instructions (Signed)
Medication Instructions:  Your physician recommends that you continue on your current medications as directed. Please refer to the Current Medication list given to you today.  *If you need a refill on your cardiac medications before your next appointment, please call your pharmacy*  Follow-Up: At [facility], you and your health needs are our priority.  As part of our continuing mission to provide you with exceptional heart care, we have created designated Provider Care Teams.  These Care Teams include your primary Cardiologist (physician) and Advanced Practice Providers (APPs -  Physician Assistants and Nurse Practitioners) who all work together to provide you with the care you need, when you need it.  We recommend signing up for the patient portal called "MyChart".  Sign up information is provided on this After Visit Summary.  MyChart is used to connect with patients for Virtual Visits (Telemedicine).  Patients are able to view lab/test results, encounter notes, upcoming appointments, etc.  Non-urgent messages can be sent to your provider as well.   To learn more about what you can do with MyChart, go to [email or web address].    Your next appointment:   3 month(s)  The format for your next appointment:   In Person  Provider:   You may see Christopher End, MD or one of the following Advanced Practice Providers on your designated Care Team:    Christopher Berge, NP  Ryan Dunn, PA-C  Jacquelyn Visser, PA-C  Cadence Furth, PA-C  

## 2020-05-18 NOTE — Progress Notes (Signed)
Follow-up Outpatient Visit Date: 05/18/2020  Primary Care Provider: [name], MD [address] [place] [address]  Chief Complaint: Shortness of breath  HPI:  Denise Sharp is a 74 y.o. female with history of coronary artery disease status post recent PCI to the ostial and mid LAD in the setting of unstable angina (12/2019), hypertension, diabetes mellitus, iron deficiency anemia status post gastric bypass x2, irritable bowel syndrome, esophageal stenosis, chronic dizziness with gait instability, cervicogenic headache with cervical spine stenosis, obstructive sleep apnea on CPAP, restless leg syndrome, and adrenal mass, who presents for follow-up of coronary artery disease as well as preoperative cardiovascular risk assessment in anticipation of dental extractions.  She was doing well at her last visit with Christell Faith, PA-C, in late July.  She had a fall in early September after developing orthostatic lightheadedness when standing up quickly.  She struck her head in the process and was advised to go to the emergency department where work-up was unrevealing.  Today, [name] states that she has continued to feel quite short of breath, no different than before her PCI to the LAD in June.  It is hard to know if she has had any further chest pain, as she openly admits that it is hard for her to distinguish between shortness of breath, angina, and GI symptoms.  She notes dysphagia with plans for esophageal dilation in the near future when Plavix can be interrupted.  She also wishes to have dental extractions done soon, as she is concerned about worsening tooth infection.  Since her fall in September, [name] continues to have intermittent headaches.  She also feels some lumps on her scalp with that were not previously there.  She reports continued intermittent orthostatic lightheadedness.  She has not passed out.  She denies palpitations.  Her abdomen seems to swell at  times but she does not have edema involving her legs.  She also denies orthopnea.  She does not exercise regularly and had a hard time participating with the exercises in cardiac rehab due to orthopedic issues.  --------------------------------------------------------------------------------------------------  Cardiovascular Problems:  Intermittent left bundle branch block  Coronary artery disease status post PCI to ostial and mid LAD (12/2018  Risk Factors:  Hypertension, hyperlipidemia, diabetes mellitus, obesity, sedentary lifestyle, and age greater than 61  Cath/PCI:  PCI (01/04/20): Successful PCI to the ostial and mid LAD using Resolute Onyx 3.5 x 12 mm and 2.75 x 12 mm drug-eluting stents, respectively.  LHC (12/31/19): LMCA normal.  Sequential 80% ostial and mid LAD stenoses.  No significant disease involving LCx and RCA  LHC (07/24/12, [facility]): LMCA normal. LAD, LCx, and RCA with minor luminal irregularities.  LHC (04/15/86, [facility]): Normal LMCA, LAD, LCx, and RCA. LVEF 65%.  CV Surgery:  None  EP Procedures and Devices:  None  Non-Invasive Evaluation(s):  TTE (12/30/2019): Normal LV size with LVEF 65-70% and grade 2 diastolic dysfunction.  Normal RV size and function.  No significant valvular abnormality.  Pharmacologic MPI (06/03/19): Low risk study without evidence of ischemia. Small fixed defect at the apex was felt to be most consistent with artifact and less likely scar.  Cardiac CT calcium score (09/03/16, Dr. Humphrey Rolls): Coronary calcium score 1548.  Pharmacologic myocardial perfusion stress test (08/08/16, Dr. Humphrey Rolls): "Equivocal stress test" with small inferior wall reversible defect and hyperdynamic LV contraction (EF 87%).  Transthoracic echocardiogram (08/06/16, Dr.Khan): Technically difficult study with mildly dilated left ventricle. LVEF was normal with normal wall motion. Grade 1  diastolic dysfunction. Mildly dilated RV with normal contraction. Mitral annular  calcification present with mild MR. Mild PR and TR also present. Normal pulmonary artery pressure. Severe left and mild right atrial enlargement.  Pharmacologic myocardial perfusion stress test (08/11/15, [facility]): Normal perfusion without ischemia or scar. LVEF 81%.  Recent CV Pertinent Labs: Lab Results  Component Value Date   CHOL 183 12/31/2019   CHOL 155 02/27/2014   HDL 42 12/31/2019   HDL 43 02/27/2014   LDLCALC 113 (H) 12/31/2019   LDLCALC 88 02/27/2014   LDLDIRECT 111.0 06/16/2015   TRIG 140 12/31/2019   TRIG 121 02/27/2014   CHOLHDL 4.4 12/31/2019   INR 1.1 12/31/2019   K 4.3 03/23/2020   K 4.0 06/21/2014   MG 1.5 (L) 10/22/2018   MG 1.5 (L) 08/05/2012   BUN 10 03/23/2020   BUN 11 10/22/2018   BUN 12 06/21/2014   CREATININE 1.05 (H) 03/23/2020   CREATININE 0.88 03/06/2019    Past medical and surgical history were reviewed and updated in EPIC.  Current Meds  Medication Sig  . albuterol (VENTOLIN HFA) 108 (90 Base) MCG/ACT inhaler Inhale 2 puffs into the lungs every 8 (eight) hours as needed for wheezing or shortness of breath.  [name] aspirin EC 81 MG tablet Take 1 tablet (81 mg total) by mouth daily.  [name] atorvastatin (LIPITOR) 40 MG tablet Take 1 tablet (40 mg total) by mouth daily.  . benzonatate (TESSALON) 200 MG capsule Take 1 capsule (200 mg total) by mouth 2 (two) times daily as needed for cough.  [name] buPROPion (WELLBUTRIN) 75 MG tablet TAKE 2 TABLETS BY MOUTH EVERY MORNING AND 1 TABLET IN THE AFTERNOON  . butalbital-acetaminophen-caffeine (FIORICET) 50-325-40 MG tablet Take 1 tablet by mouth every 6 (six) hours as needed for headache.  . citalopram (CELEXA) 10 MG tablet Take 1 tablet (10 mg total) by mouth daily.  . clopidogrel (PLAVIX) 75 MG tablet Take 1 tablet (75 mg total) by mouth daily.  . clotrimazole-betamethasone (LOTRISONE) cream Apply 1 application topically 2 (two) times daily. (Patient taking differently: Apply 1 application topically 2 (two) times daily  as needed (rash). )  . Continuous Blood Gluc Receiver (FREESTYLE LIBRE 14 DAY READER) DEVI 1 applicator by Does not apply route 4 (four) times daily.  . Continuous Blood Gluc Sensor (FREESTYLE LIBRE 14 DAY SENSOR) MISC Use to test blood sugar 4 times daily  . cyanocobalamin (,VITAMIN B-12,) 1000 MCG/ML injection Inject 1 ml (1000 mcg ) IM weekly x 4,  Then monthly thereafter (Patient taking differently: Inject 1,000 mcg into the muscle every 30 (thirty) days. On the 12th day)  . dapagliflozin propanediol (FARXIGA) 5 MG TABS tablet Take 1 tablet (5 mg total) by mouth daily before breakfast.  . diazepam (VALIUM) 5 MG tablet Take 1 tablet (5 mg total) by mouth at bedtime as needed for anxiety.  . dicyclomine (BENTYL) 10 MG capsule Take 1 capsule (10 mg total) by mouth 4 (four) times daily -  before meals and at bedtime.  . diphenhydrAMINE (BENADRYL) 25 MG tablet Take 50 mg by mouth daily as needed for allergies.   . diphenoxylate-atropine (LOMOTIL) 2.5-0.025 MG tablet Take 1 tablet by mouth 4 (four) times daily as needed for diarrhea or loose stools.  . DROPLET PEN NEEDLES 31G X 5 MM MISC USE ONE NEEDLE SUBCUTANEOUSLY AS DIRECTED (REMOVE AND DISCARD NEEDLE IN SHARPS CONTAINER IMMEDIATELY AFTER USE)  . Eszopiclone 3 MG TABS Take 1 tablet (3 mg total) by mouth at bedtime.  [name]  hyoscyamine (ANASPAZ) 0.125 MG TBDP disintergrating tablet DISSOLVE UNDER TONGUE 15 MINUTES BEFORE MEALS  . Insulin Glargine (LANTUS SOLOSTAR) 100 UNIT/ML Solostar Pen Inject 11 Units into the skin daily. (Patient taking differently: Inject 10-15 Units into the skin daily. )  . ipratropium (ATROVENT) 0.06 % nasal spray Place 2 sprays into both nostrils 4 (four) times daily.  [name] levETIRAcetam (KEPPRA) 750 MG tablet Take 1 tablet (750 mg total) by mouth at bedtime. (Patient taking differently: Take 750 mg by mouth at bedtime as needed (restless legs). )  . loperamide (IMODIUM) 2 MG capsule Take by mouth.  . methocarbamol (ROBAXIN) 500 MG  tablet Take 500 mg by mouth at bedtime.  [name] neomycin-polymyxin-hydrocortisone (CORTISPORIN) OTIC solution Place 3 drops into both ears 4 (four) times daily. For otitis externa  . nitroGLYCERIN (NITROSTAT) 0.4 MG SL tablet Place 1 tablet (0.4 mg total) under the tongue every 5 (five) minutes as needed for chest pain.  . NONFORMULARY OR COMPOUNDED [place]  Combination Pain Cream -  Baclofen 2%, Doxepin 5%, Gabapentin 6%, Topiramate 2%, Pentoxifylline 3% Apply 1-2 grams to affected area 3-4 times daily Qty. 120 gm 3 refills  . nystatin [facility]) powder Apply 1 g topically 4 (four) times daily as needed (rash).   . ondansetron (ZOFRAN ODT) 4 MG disintegrating tablet Take 1 tablet (4 mg total) by mouth every 8 (eight) hours as needed for nausea or vomiting.  [name] OVER THE COUNTER MEDICATION Apply 1 application topically daily as needed (pain). [place] Gell  . pregabalin (LYRICA) 100 MG capsule Take 100 mg by mouth at bedtime as needed (leg cramps).   . traMADol (ULTRAM) 50 MG tablet Take 0.5 tablets (25 mg total) by mouth every 12 (twelve) hours as needed for severe pain.  [name] umeclidinium-vilanterol (ANORO ELLIPTA) 62.5-25 MCG/INH AEPB Inhale 1 puff into the lungs daily.    Allergies: Demeclocycline, Erythromycin, Flagyl [metronidazole], Glucophage [metformin hcl], Tetracyclines & related, Diovan [valsartan], Sulfa antibiotics, and Xanax [alprazolam]  Social History   Tobacco Use  . Smoking status: Never Smoker  . Smokeless tobacco: Never Used  Vaping Use  . Vaping Use: Never used  Substance Use Topics  . Alcohol use: No  . Drug use: No    Family History  Problem Relation Age of Onset  . Heart disease Father   . Hypertension Father   . Prostate cancer Father   . Stroke Father   . Osteoporosis Father   . Stroke Mother   . Depression Mother   . Headache Mother   . Heart disease Mother   . Thyroid disease Mother   . Hypertension Mother   . Diabetes Daughter   . Heart  disease Daughter   . Hypertension Daughter   . Hypertension Son     Review of Systems: A 12-system review of systems was performed and was negative except as noted in the HPI.  --------------------------------------------------------------------------------------------------  Physical Exam: BP 124/70 (BP Location: Right Arm, Patient Position: Sitting, Cuff Size: Large)   Pulse 72   Ht 5' 4.5" (1.638 m)   Wt 249 lb (112.9 kg)   SpO2 96%   BMI 42.08 kg/m   General: NAD.  Accompanied by her husband. Neck: No gross JVD, though evaluation is limited by body habitus. Lungs: Mildly diminished breath sounds throughout without wheezes or crackles. Heart: Distant heart sounds.  Regular rate and rhythm without murmurs, rubs, or gallops. Abdomen: Obese but soft.  Nontender/nondistended. Extremities: No lower extremity edema.  EKG (03/23/20 - personally reviewed):  Normal sinus rhythm without abnormality.  Lab Results  Component Value Date   WBC 10.5 03/23/2020   HGB 10.2 (L) 03/23/2020   HCT 33.8 (L) 03/23/2020   MCV 80.7 03/23/2020   PLT 378 03/23/2020    Lab Results  Component Value Date   NA 140 03/23/2020   K 4.3 03/23/2020   CL 103 03/23/2020   CO2 26 03/23/2020   BUN 10 03/23/2020   CREATININE 1.05 (H) 03/23/2020   GLUCOSE 165 (H) 03/23/2020   ALT 10 11/03/2019    Lab Results  Component Value Date   CHOL 183 12/31/2019   HDL 42 12/31/2019   LDLCALC 113 (H) 12/31/2019   LDLDIRECT 111.0 06/16/2015   TRIG 140 12/31/2019   CHOLHDL 4.4 12/31/2019    --------------------------------------------------------------------------------------------------  ASSESSMENT AND PLAN: Coronary artery disease and dyspnea on exertion: Ms. Sorrels continues to have significant shortness of breath with minimal activity despite revascularization of her LAD in June.  Her symptoms are not any worse, though they also have not improved.  I suspect that her dyspnea is multifactorial but  driven primarily by deconditioning and morbid obesity.  I am not sure that repeat ischemia testing at this time would add much.  We discussed adding a diuretic for possible component of HFpEF given grade 2 diastolic dysfunction on prior echo.  However, Ms. Ouellet reports frequent orthostatic lightheadedness that would likely be exacerbated by diuretics or other antianginal medications such as beta blockers, calcium channel blockers, and long-acting nitrates.  We will plan to continue her current medications for secondary prevention.  I would like to complete a minimum of 6 months of dual antiplatelet therapy before discontinuing clopidogrel for elective procedures.  It would be beneficial to continue long-term DAPT given ostial LAD stent extending back into the distal LMCA.  Chronic HFpEF: Ms. Belknap appears grossly euvolemic on exam though body habitus makes assessment challenging.  She reports NYHA class III symptoms.  We discussed adding low-dose furosemide but have agreed to defer this out of concern that she "stays dehydrated" and may have worsening of her frequent orthostatic lightheadedness.  Hyperlipidemia: Continue atorvastatin 40 mg daily.  Ms. Chatmon reports that she has follow-up with Dr. Lupita Dawn office in the next week or 2 and that labs will likely be done then.  If possible, LFTs and lipid panel should be checked at that time.  Morbid obesity: BMI remains above 40 with multiple comorbidities.  Weight loss encouraged through diet and exercise.  Preoperative cardiovascular risk assessment: Ms. Jerde is in need of dental extractions.  She has stable chronic exertional dyspnea without evidence of acute heart failure decompensation or unstable ischemic heart disease.  There is no indication that additional cardiac testing or intervention at this time would further mitigate her perioperative risk for a low risk procedure such as dental extractions.  I would favor continuation of  aspirin and clopidogrel in the perioperative period as long as risk for bleeding is not excessive.  If clopidogrel must be held, I would favor deferring this until after December 10 unless dental extraction must be done more imminently to avoid significant morbidity or mortality.  Follow-up: Return to clinic in 3 months.  [name], MD 05/18/2020 1:46 PM

## 2020-05-19 NOTE — Telephone Encounter (Signed)
Please call to discuss status of pre operative clearance

## 2020-05-19 NOTE — Telephone Encounter (Signed)
Called the requesting office and informed them that I will fax over the recent office note and cardiac clearance to their office

## 2020-05-23 ENCOUNTER — Other Ambulatory Visit: Payer: Self-pay | Admitting: Internal Medicine

## 2020-05-23 NOTE — Telephone Encounter (Signed)
Last OV 03/10/20 and last refill 10/03/18

## 2020-05-24 ENCOUNTER — Ambulatory Visit: Payer: Medicare Other

## 2020-05-24 ENCOUNTER — Ambulatory Visit: Payer: Medicare Other | Admitting: Pharmacist

## 2020-05-24 ENCOUNTER — Other Ambulatory Visit: Payer: Self-pay

## 2020-05-24 DIAGNOSIS — R0600 Dyspnea, unspecified: Secondary | ICD-10-CM

## 2020-05-24 DIAGNOSIS — I10 Essential (primary) hypertension: Secondary | ICD-10-CM

## 2020-05-24 DIAGNOSIS — I25110 Atherosclerotic heart disease of native coronary artery with unstable angina pectoris: Secondary | ICD-10-CM

## 2020-05-24 DIAGNOSIS — E1165 Type 2 diabetes mellitus with hyperglycemia: Secondary | ICD-10-CM

## 2020-05-24 DIAGNOSIS — R0609 Other forms of dyspnea: Secondary | ICD-10-CM

## 2020-05-24 DIAGNOSIS — E1140 Type 2 diabetes mellitus with diabetic neuropathy, unspecified: Secondary | ICD-10-CM

## 2020-05-24 DIAGNOSIS — IMO0002 Reserved for concepts with insufficient information to code with codable children: Secondary | ICD-10-CM

## 2020-05-24 MED ORDER — ANORO ELLIPTA 62.5-25 MCG/INH IN AEPB: 1.0000 | INHALATION_SPRAY | Freq: Every day | RESPIRATORY_TRACT | 3 refills | Status: DC

## 2020-05-24 MED ORDER — DAPAGLIFLOZIN PROPANEDIOL 10 MG PO TABS: 10.0000 mg | ORAL_TABLET | Freq: Every day | ORAL | 3 refills | Status: DC

## 2020-05-24 NOTE — Patient Instructions (Signed)
Visit Information  Goals Addressed              This Visit's Progress     Patient Stated   .  PharmD "I want to control my blood sugars better" (pt-stated)        CARE PLAN ENTRY (see longitudinal plan of care for additional care plan information)  Current Barriers:  . Social, financial, community barriers:  o Continues to report fatigue.  o Reports a rash on her breast that she would like looked at. . Diabetes: uncontrolled; complicated by chronic medical conditions including recent angina s/p stent placement x2, s/p gastric bypass, HFpEF, HTN, depression, most recent A1c 7.5% . Most recent eGFR: ~49 mL/min . Current antihyperglycemic regimen: Lantus 8 units daily, Farxiga 5 mg daily Date of Download: 10/27-11/9/21 % Time CGM is active: 59% Average Glucose: 174 mg/dL Glucose Management Indicator: 7.4%  Glucose Variability: 40.3 (goal <36%) Time in Goal:  - Time in range 70-180: 59% - Time above range: 41% - Time below range: 0% Observed patterns: post supper elevations (generally only eating 1 meal per day) . Cardiovascular risk reduction: o Current hypertensive regimen: n/a o Current hyperlipidemia regimen: atorvastatin 40 mg daily o Current antiplatelet regimen: ASA 81 mg daily, clopidogrel 75 mg daily - DAPT for at least 6 months post intervention, then clopidogrel can be held for procedures, per cardiology  . Eye exam: due . Nephropathy screening: up to date . Foot Exam: due . Depression/Anxiety: citalopram 10 mg daily, bupropion 150 mg QAM, 75 mg QPM, diazepam 5 mg QPM, Lunesta 3 mg QPM . GI (s/p gastric bypass, chronic diarrhea): dicyclomine 10 mg QID PRN, hyoscyamine 10 mg QID PRN . "Leg cramps": pregabalin 100 mg QPM PRN, levetiracetam 500 mg QPM PRN . COPD: Anoro daily PRN, albuterol HFA PRN - realizes that she has been without Anoro for a while, has been taking albuterol more frequently.  . Allergies/chronic cough: ipratropium nasal PRN,   Pharmacist Clinical  Goal(s):  [name] Over the next 90 days, patient will work with PharmD and primary care provider to address optimized medication management  Interventions: . Comprehensive medication review performed, medication list updated in electronic medical record . Inter-disciplinary care team collaboration (see longitudinal plan of care) . Reviewed CGM readings. Increase Farxiga to 10 mg daily. Will likely need mealtime insulin w/ supper; will review glucose at next appointment and determine at that time . Encouraged to schedule an appt w/ provider for breast rash . Collaborated w/ [name] to schedule CMP, Lipids, A1c and have drawn today. Encouraged patient to schedule f/u appt for chronic condition management w/ [name] as soon as able.  . Refilled Anoro. Reviewed principals of maintenance vs rescue inhalers.   Patient Self Care Activities:  . Patient will scan glucose at least QID , document, and provide at future appointments . Patient will take medications as prescribed . Patient will report any questions or concerns to provider  . Patient will start keeping a food diary to determine what types of meals cause significant sugar elevations  Please see past updates related to this goal by clicking on the "Past Updates" button in the selected goal         The patient verbalized understanding of instructions provided today and declined a print copy of patient instruction materials.   Plan:  - Scheduled f/u face to face in ~ 4 weeks  Catie Darnelle Maffucci, PharmD, [place], [place] Pharmacist [place] [phone]

## 2020-05-24 NOTE — Chronic Care Management (AMB) (Signed)
Chronic Care Management   Follow Up Note   05/24/2020 Name: Denise Sharp MRN: [id number] DOB: [birth year]  Referred by: [name], MD Reason for referral : Chronic Care Management (Medication Management)   Denise Sharp is a 74 y.o. year old female who is a primary care patient of Tullo, Aris Everts, MD. The CCM team was consulted for assistance with chronic disease management and care coordination needs.    Review of patient status, including review of consultants reports, relevant laboratory and other test results, and collaboration with appropriate care team members and the patient's provider was performed as part of comprehensive patient evaluation and provision of chronic care management services.    SDOH (Social Determinants of Health) assessments performed: No See Care Plan activities for detailed interventions related to [facility])     Outpatient Encounter Medications as of 05/24/2020  Medication Sig Note  . albuterol (VENTOLIN HFA) 108 (90 Base) MCG/ACT inhaler Inhale 2 puffs into the lungs every 8 (eight) hours as needed for wheezing or shortness of breath.   [name] aspirin EC 81 MG tablet Take 1 tablet (81 mg total) by mouth daily.   [name] atorvastatin (LIPITOR) 40 MG tablet Take 1 tablet (40 mg total) by mouth daily.   . benzonatate (TESSALON) 200 MG capsule Take 1 capsule (200 mg total) by mouth 2 (two) times daily as needed for cough.   [name] buPROPion (WELLBUTRIN) 75 MG tablet TAKE 2 TABLETS BY MOUTH EVERY MORNING AND 1 TABLET IN THE AFTERNOON   . butalbital-acetaminophen-caffeine (FIORICET) 50-325-40 MG tablet TAKE 1 TABLET BY MOUTH EVERY 6 HOURS AS NEEDED   . citalopram (CELEXA) 10 MG tablet Take 1 tablet (10 mg total) by mouth daily.   . clopidogrel (PLAVIX) 75 MG tablet Take 1 tablet (75 mg total) by mouth daily.   . Continuous Blood Gluc Receiver (FREESTYLE LIBRE 14 DAY READER) DEVI 1 applicator by Does not apply route 4 (four) times daily.   . Continuous Blood Gluc Sensor  (FREESTYLE LIBRE 14 DAY SENSOR) MISC Use to test blood sugar 4 times daily   . cyanocobalamin (,VITAMIN B-12,) 1000 MCG/ML injection Inject 1 ml (1000 mcg ) IM weekly x 4,  Then monthly thereafter (Patient taking differently: Inject 1,000 mcg into the muscle every 30 (thirty) days. On the 12th day)   . dapagliflozin propanediol (FARXIGA) 10 MG TABS tablet Take 1 tablet (10 mg total) by mouth daily before breakfast.   . diazepam (VALIUM) 5 MG tablet Take 1 tablet (5 mg total) by mouth at bedtime as needed for anxiety.   . dicyclomine (BENTYL) 10 MG capsule Take 1 capsule (10 mg total) by mouth 4 (four) times daily -  before meals and at bedtime. 12/31/2019: Pt takes differently: take 1 capsule 4 times a day as needed.  . diphenoxylate-atropine (LOMOTIL) 2.5-0.025 MG tablet Take 1 tablet by mouth 4 (four) times daily as needed for diarrhea or loose stools.   . DROPLET PEN NEEDLES 31G X 5 MM MISC USE ONE NEEDLE SUBCUTANEOUSLY AS DIRECTED (REMOVE AND DISCARD NEEDLE IN SHARPS CONTAINER IMMEDIATELY AFTER USE)   . Eszopiclone 3 MG TABS Take 1 tablet (3 mg total) by mouth at bedtime.   . Insulin Glargine (LANTUS SOLOSTAR) 100 UNIT/ML Solostar Pen Inject 11 Units into the skin daily. (Patient taking differently: Inject 10-15 Units into the skin daily. ) 05/24/2020: 8 units  . NONFORMULARY OR COMPOUNDED [place]  Combination Pain Cream -  Baclofen 2%, Doxepin 5%, Gabapentin 6%, Topiramate  2%, Pentoxifylline 3% Apply 1-2 grams to affected area 3-4 times daily Qty. 120 gm 3 refills 12/31/2019: Pt takes differently: Apply 3-4 times daily as needed.  [name] OVER THE COUNTER MEDICATION Apply 1 application topically daily as needed (pain). [place] Gell   . pregabalin (LYRICA) 100 MG capsule Take 100 mg by mouth at bedtime as needed (leg cramps).    [name] umeclidinium-vilanterol (ANORO ELLIPTA) 62.5-25 MCG/INH AEPB Inhale 1 puff into the lungs daily.   . [DISCONTINUED] dapagliflozin propanediol (FARXIGA) 5 MG TABS  tablet Take 1 tablet (5 mg total) by mouth daily before breakfast.   . [DISCONTINUED] umeclidinium-vilanterol (ANORO ELLIPTA) 62.5-25 MCG/INH AEPB Inhale 1 puff into the lungs daily.   . clotrimazole-betamethasone (LOTRISONE) cream Apply 1 application topically 2 (two) times daily. (Patient taking differently: Apply 1 application topically 2 (two) times daily as needed (rash). )   . diphenhydrAMINE (BENADRYL) 25 MG tablet Take 50 mg by mouth daily as needed for allergies.    . hyoscyamine (ANASPAZ) 0.125 MG TBDP disintergrating tablet DISSOLVE UNDER TONGUE 15 MINUTES BEFORE MEALS   . ipratropium (ATROVENT) 0.06 % nasal spray Place 2 sprays into both nostrils 4 (four) times daily. 12/31/2019: Pt takes differently: Place 2 sprays into each nostril 4 times daily as needed.  . levETIRAcetam (KEPPRA) 750 MG tablet Take 1 tablet (750 mg total) by mouth at bedtime. (Patient not taking: Reported on 05/24/2020) 12/31/2019: Pt takes differently: Take 1 tablet at bedtime as needed.  . loperamide (IMODIUM) 2 MG capsule Take by mouth.   . methocarbamol (ROBAXIN) 500 MG tablet Take 500 mg by mouth at bedtime.   [name] neomycin-polymyxin-hydrocortisone (CORTISPORIN) OTIC solution Place 3 drops into both ears 4 (four) times daily. For otitis externa   . nitroGLYCERIN (NITROSTAT) 0.4 MG SL tablet Place 1 tablet (0.4 mg total) under the tongue every 5 (five) minutes as needed for chest pain.   [name] nystatin [facility]) powder Apply 1 g topically 4 (four) times daily as needed (rash).    . ondansetron (ZOFRAN ODT) 4 MG disintegrating tablet Take 1 tablet (4 mg total) by mouth every 8 (eight) hours as needed for nausea or vomiting.   . traMADol (ULTRAM) 50 MG tablet Take 0.5 tablets (25 mg total) by mouth every 12 (twelve) hours as needed for severe pain. 12/31/2019: Pt takes differently: Take 1 tablet (50 mg) every 12 hours as needed for severe pain.   No facility-administered encounter medications on file as of 05/24/2020.      Objective:     Goals Addressed              This Visit's Progress     Patient Stated   .  PharmD "I want to control my blood sugars better" (pt-stated)        CARE PLAN ENTRY (see longitudinal plan of care for additional care plan information)  Current Barriers:  . Social, financial, community barriers:  o Continues to report fatigue.  o Reports a rash on her breast that she would like looked at. . Diabetes: uncontrolled; complicated by chronic medical conditions including recent angina s/p stent placement x2, s/p gastric bypass, HFpEF, HTN, depression, most recent A1c 7.5% . Most recent eGFR: ~49 mL/min . Current antihyperglycemic regimen: Lantus 8 units daily, Farxiga 5 mg daily Date of Download: 10/27-11/9/21 % Time CGM is active: 59% Average Glucose: 174 mg/dL Glucose Management Indicator: 7.4%  Glucose Variability: 40.3 (goal <36%) Time in Goal:  - Time in range 70-180: 59% - Time above  range: 41% - Time below range: 0% Observed patterns: post supper elevations (generally only eating 1 meal per day) . Cardiovascular risk reduction: o Current hypertensive regimen: n/a o Current hyperlipidemia regimen: atorvastatin 40 mg daily o Current antiplatelet regimen: ASA 81 mg daily, clopidogrel 75 mg daily - DAPT for at least 6 months post intervention, then clopidogrel can be held for procedures, per cardiology  . Eye exam: due . Nephropathy screening: up to date . Foot Exam: due . Depression/Anxiety: citalopram 10 mg daily, bupropion 150 mg QAM, 75 mg QPM, diazepam 5 mg QPM, Lunesta 3 mg QPM . GI (s/p gastric bypass, chronic diarrhea): dicyclomine 10 mg QID PRN, hyoscyamine 10 mg QID PRN . "Leg cramps": pregabalin 100 mg QPM PRN, levetiracetam 500 mg QPM PRN . COPD: Anoro daily PRN, albuterol HFA PRN - realizes that she has been without Anoro for a while, has been taking albuterol more frequently.  . Allergies/chronic cough: ipratropium nasal PRN,   Pharmacist Clinical  Goal(s):  [name] Over the next 90 days, patient will work with PharmD and primary care provider to address optimized medication management  Interventions: . Comprehensive medication review performed, medication list updated in electronic medical record . Inter-disciplinary care team collaboration (see longitudinal plan of care) . Reviewed CGM readings. Increase Farxiga to 10 mg daily. Will likely need mealtime insulin w/ supper; will review glucose at next appointment and determine at that time . Encouraged to schedule an appt w/ provider for breast rash . Collaborated w/ [name] to schedule CMP, Lipids, A1c and have drawn today. Encouraged patient to schedule f/u appt for chronic condition management w/ [name] as soon as able.  . Refilled Anoro. Reviewed principals of maintenance vs rescue inhalers.   Patient Self Care Activities:  . Patient will scan glucose at least QID , document, and provide at future appointments . Patient will take medications as prescribed . Patient will report any questions or concerns to provider  . Patient will start keeping a food diary to determine what types of meals cause significant sugar elevations  Please see past updates related to this goal by clicking on the "Past Updates" button in the selected goal          Plan:  - Scheduled f/u face to face in ~ 4 weeks  Catie Darnelle Maffucci, PharmD, [place], CPP Clinical Pharmacist [place] [phone]

## 2020-05-25 ENCOUNTER — Other Ambulatory Visit: Payer: Self-pay | Admitting: Internal Medicine

## 2020-05-25 LAB — COMPREHENSIVE METABOLIC PANEL WITH GFR
ALT: 10 U/L (ref 0–35)
AST: 12 U/L (ref 0–37)
Albumin: 3.6 g/dL (ref 3.5–5.2)
Alkaline Phosphatase: 136 U/L — ABNORMAL HIGH (ref 39–117)
BUN: 9 mg/dL (ref 6–23)
CO2: 24 meq/L (ref 19–32)
Calcium: 8.4 mg/dL (ref 8.4–10.5)
Chloride: 102 meq/L (ref 96–112)
Creatinine, Ser: 1.2 mg/dL (ref 0.40–1.20)
GFR: 44.54 mL/min — ABNORMAL LOW
Glucose, Bld: 135 mg/dL — ABNORMAL HIGH (ref 70–99)
Potassium: 3.7 meq/L (ref 3.5–5.1)
Sodium: 140 meq/L (ref 135–145)
Total Bilirubin: 0.3 mg/dL (ref 0.2–1.2)
Total Protein: 6.8 g/dL (ref 6.0–8.3)

## 2020-05-25 LAB — LIPID PANEL
Cholesterol: 134 mg/dL (ref 0–200)
HDL: 60.4 mg/dL (ref >39.00)
LDL Cholesterol: 48 mg/dL (ref 0–99)
NonHDL: 73.25
Total CHOL/HDL Ratio: 2
Triglycerides: 128 mg/dL (ref 0.0–149.0)
VLDL: 25.6 mg/dL (ref 0.0–40.0)

## 2020-05-25 LAB — HEMOGLOBIN A1C: Hgb A1c MFr Bld: 8.5 % — ABNORMAL HIGH (ref 4.6–6.5)

## 2020-05-25 NOTE — Progress Notes (Signed)
Diabetes not controlled and kidney function is slightly off. Please move appt up to next available to discuss , rather than waiting til mid December.

## 2020-05-27 ENCOUNTER — Telehealth: Payer: Self-pay | Admitting: Internal Medicine

## 2020-05-27 MED ORDER — DIAZEPAM 5 MG PO TABS: 5.0000 mg | ORAL_TABLET | Freq: Every evening | ORAL | 5 refills | Status: DC | PRN

## 2020-05-27 MED ORDER — ESZOPICLONE 3 MG PO TABS
3.0000 mg | ORAL_TABLET | Freq: Every day | ORAL | 1 refills | Status: DC
Start: 2020-05-27 — End: 2020-09-12

## 2020-05-27 NOTE — Telephone Encounter (Signed)
Patient has been informed.

## 2020-05-27 NOTE — Telephone Encounter (Signed)
Pt needs refills on the following sent to [facility]  diazepam (VALIUM) 5 MG tablet methocarbamol (ROBAXIN) 500 MG tablet    SEND TO [facility]  Eszopiclone 3 MG TABS    pt also said that Catie has a copy of her BS log and I moved appt up to 06/07/20

## 2020-05-27 NOTE — Telephone Encounter (Signed)
The methocarbamol did not come from me  And it says she is taking it at bedtime.  I will not refill because I only authorized her to use valium and lunesta at bedtime

## 2020-05-27 NOTE — Telephone Encounter (Signed)
LS:03-10-20 LO: 11-27-19 lunesta and valium 04-08-20 foir robaxin

## 2020-05-27 NOTE — Addendum Note (Signed)
Addended by: [name] on: 05/27/2020 03:00 PM   Modules accepted: Orders

## 2020-06-06 ENCOUNTER — Encounter: Payer: Self-pay | Admitting: Podiatry

## 2020-06-06 ENCOUNTER — Other Ambulatory Visit: Payer: Self-pay

## 2020-06-06 ENCOUNTER — Ambulatory Visit (INDEPENDENT_AMBULATORY_CARE_PROVIDER_SITE_OTHER): Payer: Medicare Other | Admitting: Podiatry

## 2020-06-06 DIAGNOSIS — M79676 Pain in unspecified toe(s): Secondary | ICD-10-CM | POA: Diagnosis not present

## 2020-06-06 DIAGNOSIS — E1149 Type 2 diabetes mellitus with other diabetic neurological complication: Secondary | ICD-10-CM

## 2020-06-06 DIAGNOSIS — E11610 Type 2 diabetes mellitus with diabetic neuropathic arthropathy: Secondary | ICD-10-CM | POA: Diagnosis not present

## 2020-06-06 DIAGNOSIS — B351 Tinea unguium: Secondary | ICD-10-CM | POA: Diagnosis not present

## 2020-06-06 DIAGNOSIS — D689 Coagulation defect, unspecified: Secondary | ICD-10-CM | POA: Diagnosis not present

## 2020-06-06 NOTE — Progress Notes (Signed)
This patient returns to my office for at risk foot care.  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  Patient is taking plavix..  This patient is unable to cut nails herself since the patient cannot reach her nails.These nails are painful walking and wearing shoes.  This patient presents for at risk foot care today.  General Appearance  Alert, conversant and in no acute stress.  Vascular  Dorsalis pedis and posterior tibial  pulses are palpable  bilaterally.  Capillary return is within normal limits  bilaterally. Temperature is within normal limits  bilaterally.  Neurologic  Senn-Weinstein monofilament wire test absent   bilaterally. Muscle power within normal limits bilaterally.  Nails Thick disfigured discolored nails with subungual debris  from hallux to fifth toes bilaterally. No evidence of bacterial infection or drainage bilaterally.  Orthopedic  No limitations of motion  feet .  No crepitus or effusions noted.  No bony pathology or digital deformities noted. Charcot foot.  Severe rearfoot arthritis.  Skin  normotropic skin with no porokeratosis noted bilaterally.  No signs of infections or ulcers noted.     Onychomycosis  Pain in right toes  Pain in left toes  Consent was obtained for treatment procedures.   Mechanical debridement of nails 1-5  bilaterally performed with a nail nipper.  Filed with dremel without incident.    Return office visit    3 months                  Told patient to return for periodic foot care and evaluation due to potential at risk complications.   Gardiner Barefoot DPM

## 2020-06-07 ENCOUNTER — Ambulatory Visit (INDEPENDENT_AMBULATORY_CARE_PROVIDER_SITE_OTHER): Payer: Medicare Other | Admitting: Internal Medicine

## 2020-06-07 ENCOUNTER — Encounter: Payer: Self-pay | Admitting: Internal Medicine

## 2020-06-07 VITALS — BP 142/76 | HR 74 | Temp 98.8°F | Resp 15 | Ht 64.5 in | Wt 250.6 lb

## 2020-06-07 DIAGNOSIS — E1165 Type 2 diabetes mellitus with hyperglycemia: Secondary | ICD-10-CM | POA: Diagnosis not present

## 2020-06-07 DIAGNOSIS — Z1231 Encounter for screening mammogram for malignant neoplasm of breast: Secondary | ICD-10-CM | POA: Diagnosis not present

## 2020-06-07 DIAGNOSIS — R42 Dizziness and giddiness: Secondary | ICD-10-CM | POA: Diagnosis not present

## 2020-06-07 DIAGNOSIS — E662 Morbid (severe) obesity with alveolar hypoventilation: Secondary | ICD-10-CM | POA: Diagnosis not present

## 2020-06-07 DIAGNOSIS — R748 Abnormal levels of other serum enzymes: Secondary | ICD-10-CM | POA: Diagnosis not present

## 2020-06-07 DIAGNOSIS — D500 Iron deficiency anemia secondary to blood loss (chronic): Secondary | ICD-10-CM | POA: Diagnosis not present

## 2020-06-07 DIAGNOSIS — L989 Disorder of the skin and subcutaneous tissue, unspecified: Secondary | ICD-10-CM | POA: Diagnosis not present

## 2020-06-07 DIAGNOSIS — IMO0002 Reserved for concepts with insufficient information to code with codable children: Secondary | ICD-10-CM

## 2020-06-07 DIAGNOSIS — I200 Unstable angina: Secondary | ICD-10-CM

## 2020-06-07 DIAGNOSIS — E1140 Type 2 diabetes mellitus with diabetic neuropathy, unspecified: Secondary | ICD-10-CM | POA: Diagnosis not present

## 2020-06-07 DIAGNOSIS — Z23 Encounter for immunization: Secondary | ICD-10-CM | POA: Diagnosis not present

## 2020-06-07 MED ORDER — INSULIN ASPART 100 UNIT/ML ~~LOC~~ SOLN: 5.0000 [IU] | Freq: Three times a day (TID) | SUBCUTANEOUS | 0 refills | Status: DC

## 2020-06-07 NOTE — Patient Instructions (Addendum)
I am  ADDING 5 UNITS OF MEALTIME INSULIN AT LUNCHTIME  AT AT DINNER TIME   CONTINUE LANTUS 8 UNITS AND FARXIGA 10 MG  AS WELL   REFERRAL TO [facility] FOR THE SKIN LESION ON YOUR BREAST   Your  mammogram has been ordered.  You are encouraged (required) to call to make your appointment at [facility]   POSTPONE YOUR SHINGLES VACCINE FOR 2 WEEKS

## 2020-06-07 NOTE — Progress Notes (Signed)
Subjective:  Patient ID: Denise Sharp, female    DOB: 1945-08-14  Age: 74 y.o. MRN: [id number]  CC: The primary encounter diagnosis was Skin lesion of chest wall. 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  HPI SUSY PLACZEK presents for abnormal spot on right breast  . Last mammogram 2018  This visit occurred during the SARS-CoV-2 public health emergency.  Safety protocols were in place, including screening questions prior to the visit, additional usage of staff PPE, and extensive cleaning of exam room while observing appropriate contact time as indicated for disinfecting solutions.    Patient is a 74 yr old female with T2 DM, morbid obesity with OSA who is here for examination of an abnormal finding on her breast,  ut  has mutliple chronic  complaints  1) She reports profound fatigue and sleepiness,  And is averaging only 3 hours of wakefulness daily. Her Fit Bit says she is sleeping 6-8 hours per night and she is wearing CPAP managed by Dr Annamaria Boots. Workup is underway by pulmonary and cardiology .  She has a history of OHS  2) She is having two teeth extracted Dec 1  3) Type 2 DM:  She has lost control by last A1c .  Currently she is taking  8 units  lantus and 10 mg farxiga  . Fasting sugars are within range, but review of her BS from her [facility] monitor reflect out of range sugars from noon to bedtime , likely due to undisclosed eating habits . (Patient's daughter has reported eating habits in the past that are not reflected accurately in her reports)   Outpatient Medications Prior to Visit  Medication Sig Dispense Refill  . albuterol (VENTOLIN HFA) 108 (90 Base) MCG/ACT inhaler  Inhale 2 puffs into the lungs every 8 (eight) hours as needed for wheezing or shortness of breath. 8 g 2  . aspirin EC 81 MG tablet Take 1 tablet (81 mg total) by mouth daily. 30 tablet 2  . atorvastatin (LIPITOR) 40 MG tablet Take 1 tablet (40 mg total) by mouth daily. 90 tablet 0  . benzonatate (TESSALON) 200 MG capsule Take 1 capsule (200 mg total) by mouth 2 (two) times daily as needed for cough. 30 capsule 5  . buPROPion (WELLBUTRIN) 75 MG tablet TAKE 2 TABLETS BY MOUTH EVERY MORNING AND 1 TABLET IN THE AFTERNOON 270 tablet 1  . butalbital-acetaminophen-caffeine (FIORICET) 50-325-40 MG tablet TAKE 1 TABLET BY MOUTH EVERY 6 HOURS AS NEEDED 60 tablet 5  . citalopram (CELEXA) 10 MG tablet Take 1 tablet (10 mg total) by mouth daily. 90 tablet 1  . clopidogrel (PLAVIX) 75 MG tablet Take 1 tablet (75 mg total) by mouth daily. 90 tablet 0  . clotrimazole-betamethasone (LOTRISONE) cream Apply 1 application topically 2 (two) times daily. (Patient taking differently: Apply 1 application topically 2 (two) times daily as needed (rash). ) 45 g 3  . Continuous Blood Gluc Receiver (FREESTYLE LIBRE 14 DAY READER) DEVI 1 applicator by Does not apply route 4 (four) times daily. 1 each 6  . Continuous Blood Gluc Sensor (FREESTYLE LIBRE 14 DAY SENSOR) MISC Use to test blood sugar 4 times daily 1 each 6  . cyanocobalamin (,VITAMIN B-12,) 1000 MCG/ML injection Inject 1 ml (1000 mcg )  IM weekly x 4,  Then monthly thereafter (Patient taking differently: Inject 1,000 mcg into the muscle every 30 (thirty) days. On the 12th day) 110 mL 0  . dapagliflozin propanediol (FARXIGA) 10 MG TABS tablet Take 1 tablet (10 mg total) by mouth daily before breakfast. 90 tablet 3  . diazepam (VALIUM) 5 MG tablet Take 1 tablet (5 mg total) by mouth at bedtime as needed for anxiety. 30 tablet 5  . dicyclomine (BENTYL) 10 MG capsule Take 1 capsule (10 mg total) by mouth 4 (four) times daily -  before meals and at bedtime. 360 capsule 3  .  diphenhydrAMINE (BENADRYL) 25 MG tablet Take 50 mg by mouth daily as needed for allergies.     . diphenoxylate-atropine (LOMOTIL) 2.5-0.025 MG tablet Take 1 tablet by mouth 4 (four) times daily as needed for diarrhea or loose stools. 60 tablet 5  . DROPLET PEN NEEDLES 31G X 5 MM MISC USE ONE NEEDLE SUBCUTANEOUSLY AS DIRECTED (REMOVE AND DISCARD NEEDLE IN SHARPS CONTAINER IMMEDIATELY AFTER USE) 100 each 1  . Eszopiclone 3 MG TABS Take 1 tablet (3 mg total) by mouth at bedtime. 90 tablet 1  . hyoscyamine (ANASPAZ) 0.125 MG TBDP disintergrating tablet DISSOLVE UNDER TONGUE 15 MINUTES BEFORE MEALS 360 tablet 1  . Insulin Glargine (LANTUS SOLOSTAR) 100 UNIT/ML Solostar Pen Inject 11 Units into the skin daily. (Patient taking differently: Inject 10-15 Units into the skin daily. ) 5 pen 3  . ipratropium (ATROVENT) 0.06 % nasal spray Place 2 sprays into both nostrils 4 (four) times daily. 15 mL 12  . levETIRAcetam (KEPPRA) 750 MG tablet Take 1 tablet (750 mg total) by mouth at bedtime. 90 tablet 3  . loperamide (IMODIUM) 2 MG capsule Take by mouth.    . methocarbamol (ROBAXIN) 500 MG tablet Take 500 mg by mouth at bedtime.    [name] neomycin-polymyxin-hydrocortisone (CORTISPORIN) OTIC solution Place 3 drops into both ears 4 (four) times daily. For otitis externa 10 mL 0  . nitroGLYCERIN (NITROSTAT) 0.4 MG SL tablet Place 1 tablet (0.4 mg total) under the tongue every 5 (five) minutes as needed for chest pain. 20 tablet 1  . NONFORMULARY OR COMPOUNDED [place]  Combination Pain Cream -  Baclofen 2%, Doxepin 5%, Gabapentin 6%, Topiramate 2%, Pentoxifylline 3% Apply 1-2 grams to affected area 3-4 times daily Qty. 120 gm 3 refills 1 each 3  . nystatin (NYAMYC) powder Apply 1 g topically 4 (four) times daily as needed (rash).     . ondansetron (ZOFRAN ODT) 4 MG disintegrating tablet Take 1 tablet (4 mg total) by mouth every 8 (eight) hours as needed for nausea or vomiting. 20 tablet 0  . OVER THE  COUNTER MEDICATION Apply 1 application topically daily as needed (pain). [place] Gell    . pregabalin (LYRICA) 100 MG capsule Take 100 mg by mouth at bedtime as needed (leg cramps).     . traMADol (ULTRAM) 50 MG tablet Take 0.5 tablets (25 mg total) by mouth every 12 (twelve) hours as needed for severe pain. 30 tablet 0  . umeclidinium-vilanterol (ANORO ELLIPTA) 62.5-25 MCG/INH AEPB Inhale 1 puff into the lungs daily. 60 each 3   No facility-administered medications prior to visit.    Review of Systems;  Patient denies headache, fevers, malaise, unintentional weight loss, skin rash, eye pain, sinus congestion and sinus pain, sore throat, dysphagia,  hemoptysis , cough, dyspnea, wheezing, chest pain, palpitations, orthopnea, edema, abdominal pain, nausea, melena, diarrhea, constipation, flank pain, dysuria, hematuria,  urinary  Frequency, nocturia, numbness, tingling, seizures,  Focal weakness, Loss of consciousness,  Tremor, insomnia, depression, anxiety, and suicidal ideation.      Objective:  BP (!) 142/76 (BP Location: Left Arm, Patient Position: Sitting, Cuff Size: Normal)   Pulse 74   Temp 98.8 F (37.1 C) (Oral)   Resp 15   Ht 5' 4.5" (1.638 m)   Wt 250 lb 9.6 oz (113.7 kg)   SpO2 96%   BMI 42.35 kg/m   BP Readings from Last 3 Encounters:  06/07/20 (!) 142/76  05/18/20 124/70  03/23/20 (!) 141/60    Wt Readings from Last 3 Encounters:  06/07/20 250 lb 9.6 oz (113.7 kg)  05/18/20 249 lb (112.9 kg)  03/23/20 250 lb (113.4 kg)    General appearance: alert, cooperative and appears stated age Ears: normal TM's and external ear canals both ears Throat: lips, mucosa, and tongue normal; teeth and gums normal Neck: no adenopathy, no carotid bruit, supple, symmetrical, trachea midline and thyroid not enlarged, symmetric, no tenderness/mass/nodules Back: symmetric, no curvature. ROM normal. No CVA tenderness. Breasts:  No masses bilaterally.  Dime shaped crusting skin lesion on  right breast Lungs: clear to auscultation bilaterally Heart: regular rate and rhythm, S1, S2 normal, no murmur, click, rub or gallop Abdomen: obese, soft, non-tender; bowel sounds normal; no masses,  no organomegaly Pulses: 2+ and symmetric Skin: Skin color, texture, turgor normal. No rashes or lesions Lymph nodes: Cervical, supraclavicular, and axillary nodes normal.  Lab Results  Component Value Date   HGBA1C 8.5 (H) 05/24/2020   HGBA1C 7.6 (H) 01/25/2020   HGBA1C 7.7 (H) 12/31/2019    Lab Results  Component Value Date   CREATININE 1.20 05/24/2020   CREATININE 1.05 (H) 03/23/2020   CREATININE 1.11 (H) 01/25/2020    Lab Results  Component Value Date   WBC 11.1 (H) 06/07/2020   HGB 10.1 (L) 06/07/2020   HCT 32.8 (L) 06/07/2020   PLT 420.0 (H) 06/07/2020   GLUCOSE 135 (H) 05/24/2020   CHOL 134 05/24/2020   TRIG 128.0 05/24/2020   HDL 60.40 05/24/2020   LDLDIRECT 111.0 06/16/2015   LDLCALC 48 05/24/2020   ALT 9 06/07/2020   AST 12 06/07/2020   NA 140 05/24/2020   K 3.7 05/24/2020   CL 102 05/24/2020   CREATININE 1.20 05/24/2020   BUN 9 05/24/2020   CO2 24 05/24/2020   TSH 3.50 01/01/2019   INR 1.1 12/31/2019   HGBA1C 8.5 (H) 05/24/2020   MICROALBUR 2.8 (H) 06/04/2017    DG Shoulder Right  Result Date: 03/23/2020 CLINICAL DATA:  Fall EXAM: RIGHT SHOULDER - 2+ VIEW COMPARISON:  None. FINDINGS: Reverse shoulder arthroplasty. No evidence of fracture or dislocation IMPRESSION: . 1.  No fracture or dislocation. 2. 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   On: 03/23/2020 14:49   CT HEAD WO CONTRAST  Result Date: 03/23/2020 CLINICAL DATA:  Dizziness, fall. EXAM: CT HEAD WITHOUT CONTRAST TECHNIQUE: Contiguous axial images were obtained from the base of the skull through the vertex without intravenous contrast. COMPARISON:  August 19, 2017. FINDINGS: Brain: Mild chronic ischemic white matter disease is noted. No  mass effect or midline shift is noted. Ventricular size is within normal limits. There is no evidence of mass lesion, hemorrhage or acute infarction. Vascular: No hyperdense vessel or unexpected calcification. Skull: Normal. Negative for fracture or focal lesion. Sinuses/Orbits: No acute finding. Other: Small right posterior scalp hematoma is noted. IMPRESSION: Mild  chronic ischemic white matter disease. Small right posterior scalp hematoma. No acute intracranial abnormality seen. Electronically Signed   By: Marijo Conception M.D.   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 Adding 5 units of short acting insulin tid qac   Lab Results  Component Value Date   HGBA1C 8.5 (H) 05/24/2020         Relevant Medications   insulin aspart (NOVOLOG) 100 UNIT/ML injection   Skin lesion of chest wall - Primary    Referral for skin biopsy to rule out squamous cell CA      Relevant Orders   Ambulatory referral to Dermatology   Obesity hypoventilation syndrome ([place])    Likely the cause of her daytime somnolence and fatigue,  Given her body habitus. Work up for other causes is underway by pulmonology/cardiology      Relevant Medications   insulin aspart (NOVOLOG) 100 UNIT/ML injection   Morbid obesity ([place])    Despite prior bariatric surgery and more recently, panniulectomy.   Historically the only time she lost weight was when her daughter cooked for her on the [employer]ET[employer] diet        Relevant Medications   insulin aspart (NOVOLOG) 100 UNIT/ML injection   Iron deficiency anemia    Chronic , due to GI procedures and chronic inflammation.  She is intolerant of oral iron   Lab Results  Component Value Date   IRON 23 (L) 06/07/2020   TIBC 373 03/06/2019   FERRITIN 12.2 06/07/2020   Lab Results  Component Value Date   WBC  11.1 (H) 06/07/2020   HGB 10.1 (L) 06/07/2020   HCT 32.8 (L) 06/07/2020   MCV 74.8 (L) 06/07/2020   PLT 420.0 (H) 06/07/2020         Elevated alkaline phosphatase level    Etiology unclear.  Serologies for viral hepatitis ordered       Relevant Orders   Alkaline phosphatase, isoenzymes (Completed)   ANA   Anti-Smith antibody (Completed)   Hepatic function panel (Completed)   Hepatitis B core antibody, total   Hepatitis B surface antibody,qualitative   Hepatitis C antibody   Hepatitis B surface antigen   IBC + Ferritin (Completed)   Mitochondrial antibodies    Other Visit Diagnoses    Breast cancer screening by mammogram       Relevant Orders   MM 3D SCREEN BREAST BILATERAL   Dizziness       Relevant Orders   CBC with Differential/Platelet (Completed)   Need for immunization against influenza       Relevant Orders   Flu Vaccine QUAD High Dose(Fluad) (Completed)      I am having Tia Masker. Silveira start on insulin aspart. I am also having her maintain her traMADol, nystatin, pregabalin, NONFORMULARY OR COMPOUNDED ITEM, clotrimazole-betamethasone, levETIRAcetam, cyanocobalamin, Lantus SoloStar, OVER THE COUNTER MEDICATION, diphenhydrAMINE, dicyclomine, ondansetron, albuterol, ipratropium, FreeStyle Libre 14 Day Reader, FreeStyle Libre 14 Day Sensor, aspirin EC, nitroGLYCERIN, benzonatate, Droplet Pen Needles, neomycin-polymyxin-hydrocortisone, buPROPion, atorvastatin, clopidogrel, diphenoxylate-atropine, hyoscyamine, citalopram, loperamide, methocarbamol, dapagliflozin propanediol, Anoro Ellipta, butalbital-acetaminophen-caffeine, Eszopiclone, and diazepam.  Meds ordered this encounter  Medications  . insulin aspart (NOVOLOG) 100 UNIT/ML injection    Sig: Inject 5 Units into the skin 3 (three) times daily before meals.    Dispense:  10 mL    Refill:  0    There are no discontinued medications.  Follow-up: Return in about 3 months (around 09/07/2020) for follow up  diabetes.   [name], MD

## 2020-06-08 LAB — CBC WITH DIFFERENTIAL/PLATELET
Basophils Absolute: 0.1 10*3/uL (ref 0.0–0.1)
Basophils Relative: 1 % (ref 0.0–3.0)
Eosinophils Absolute: 0.2 10*3/uL (ref 0.0–0.7)
Eosinophils Relative: 2 % (ref 0.0–5.0)
HCT: 32.8 % — ABNORMAL LOW (ref 36.0–46.0)
Hemoglobin: 10.1 g/dL — ABNORMAL LOW (ref 12.0–15.0)
Lymphocytes Relative: 16.3 % (ref 12.0–46.0)
Lymphs Abs: 1.8 10*3/uL (ref 0.7–4.0)
MCHC: 30.7 g/dL (ref 30.0–36.0)
MCV: 74.8 fl — ABNORMAL LOW (ref 78.0–100.0)
Monocytes Absolute: 0.7 10*3/uL (ref 0.1–1.0)
Monocytes Relative: 5.9 % (ref 3.0–12.0)
Neutro Abs: 8.3 10*3/uL — ABNORMAL HIGH (ref 1.4–7.7)
Neutrophils Relative %: 74.8 % (ref 43.0–77.0)
Platelets: 420 10*3/uL — ABNORMAL HIGH (ref 150.0–400.0)
RBC: 4.38 Mil/uL (ref 3.87–5.11)
RDW: 19.3 % — ABNORMAL HIGH (ref 11.5–15.5)
WBC: 11.1 10*3/uL — ABNORMAL HIGH (ref 4.0–10.5)

## 2020-06-08 LAB — IBC + FERRITIN
Ferritin: 12.2 ng/mL (ref 10.0–291.0)
Iron: 23 ug/dL — ABNORMAL LOW (ref 42–145)
Saturation Ratios: 5.6 % — ABNORMAL LOW (ref 20.0–50.0)
Transferrin: 295 mg/dL (ref 212.0–360.0)

## 2020-06-08 LAB — HEPATIC FUNCTION PANEL
ALT: 9 U/L (ref 0–35)
AST: 12 U/L (ref 0–37)
Albumin: 3.6 g/dL (ref 3.5–5.2)
Alkaline Phosphatase: 134 U/L — ABNORMAL HIGH (ref 39–117)
Bilirubin, Direct: 0.1 mg/dL (ref 0.0–0.3)
Total Bilirubin: 0.3 mg/dL (ref 0.2–1.2)
Total Protein: 6.9 g/dL (ref 6.0–8.3)

## 2020-06-08 LAB — ANTI-SMITH ANTIBODY: ENA SM Ab Ser-aCnc: <1 AI

## 2020-06-10 DIAGNOSIS — L989 Disorder of the skin and subcutaneous tissue, unspecified: Secondary | ICD-10-CM | POA: Insufficient documentation

## 2020-06-10 DIAGNOSIS — R748 Abnormal levels of other serum enzymes: Secondary | ICD-10-CM | POA: Insufficient documentation

## 2020-06-10 LAB — ANTI-NUCLEAR AB-TITER (ANA TITER): ANA Titer 1: 1:40 {titer} — ABNORMAL HIGH

## 2020-06-10 LAB — ANA: Anti Nuclear Antibody (ANA): POSITIVE — AB

## 2020-06-10 LAB — MITOCHONDRIAL ANTIBODIES: Mitochondrial M2 Ab, IgG: <=20 U

## 2020-06-10 LAB — HEPATITIS B SURFACE ANTIGEN: Hepatitis B Surface Ag: NONREACTIVE

## 2020-06-10 LAB — HEPATITIS B SURFACE ANTIBODY,QUALITATIVE: Hep B S Ab: NONREACTIVE

## 2020-06-10 LAB — HEPATITIS C ANTIBODY
Hepatitis C Ab: NONREACTIVE
SIGNAL TO CUT-OFF: 0.01 (ref <1.00)

## 2020-06-10 LAB — HEPATITIS B CORE ANTIBODY, TOTAL: Hep B Core Total Ab: NONREACTIVE

## 2020-06-10 NOTE — Assessment & Plan Note (Signed)
Secondary to lack of exercise and abnormal eating habits. Adding 5 units of short acting insulin tid qac   Lab Results  Component Value Date   HGBA1C 8.5 (H) 05/24/2020

## 2020-06-10 NOTE — Progress Notes (Signed)
Your additional labs to investigate the cause of your liver enzyme are negative for viral infections and autoimmune disease.  Fatty liver is likely the cause , which cannot be cured, but can be managed   with weight loss .  Your iron stores are very low and your hemoglobin has dropped slightly, so you will need to be referred to a  hematologist  too arrange for iron infusions.  If you are in agreement to the referral, let me know and I will make the referral  Regards,   Deborra Medina, MD

## 2020-06-10 NOTE — Assessment & Plan Note (Signed)
Likely the cause of her daytime somnolence and fatigue,  Given her body habitus. Work up for other causes is underway by pulmonology/cardiology

## 2020-06-10 NOTE — Assessment & Plan Note (Signed)
Etiology unclear.  Serologies for viral hepatitis ordered

## 2020-06-10 NOTE — Assessment & Plan Note (Signed)
Chronic , due to GI procedures and chronic inflammation.  She is intolerant of oral iron   Lab Results  Component Value Date   IRON 23 (L) 06/07/2020   TIBC 373 03/06/2019   FERRITIN 12.2 06/07/2020   Lab Results  Component Value Date   WBC 11.1 (H) 06/07/2020   HGB 10.1 (L) 06/07/2020   HCT 32.8 (L) 06/07/2020   MCV 74.8 (L) 06/07/2020   PLT 420.0 (H) 06/07/2020

## 2020-06-10 NOTE — Assessment & Plan Note (Addendum)
Despite prior bariatric surgery and more recently, panniulectomy.   Historically the only time she lost weight was when her daughter cooked for her on the [place]

## 2020-06-10 NOTE — Assessment & Plan Note (Signed)
Referral for skin biopsy to rule out squamous cell CA

## 2020-06-11 LAB — ALKALINE PHOSPHATASE, ISOENZYMES
Alkaline Phosphatase: 153 IU/L — ABNORMAL HIGH (ref 44–121)
BONE FRACTION: 37 % (ref 14–68)
INTESTINAL FRAC.: 3 % (ref 0–18)
LIVER FRACTION: 60 % (ref 18–85)

## 2020-06-13 ENCOUNTER — Telehealth: Payer: Self-pay | Admitting: Internal Medicine

## 2020-06-13 MED ORDER — DROPLET PEN NEEDLES 31G X 5 MM MISC
1 refills | Status: DC
Start: 2020-06-13 — End: 2021-02-17

## 2020-06-13 NOTE — Telephone Encounter (Signed)
Patient called in stated that she need the needles to go with insulin aspart (NOVOLOG) 100 UNIT/ML injection

## 2020-06-14 ENCOUNTER — Telehealth: Payer: Self-pay | Admitting: Internal Medicine

## 2020-06-14 ENCOUNTER — Telehealth: Payer: Self-pay

## 2020-06-14 DIAGNOSIS — D2262 Melanocytic nevi of left upper limb, including shoulder: Secondary | ICD-10-CM | POA: Diagnosis not present

## 2020-06-14 DIAGNOSIS — D225 Melanocytic nevi of trunk: Secondary | ICD-10-CM | POA: Diagnosis not present

## 2020-06-14 DIAGNOSIS — L821 Other seborrheic keratosis: Secondary | ICD-10-CM | POA: Diagnosis not present

## 2020-06-14 DIAGNOSIS — D2272 Melanocytic nevi of left lower limb, including hip: Secondary | ICD-10-CM | POA: Diagnosis not present

## 2020-06-14 DIAGNOSIS — D2271 Melanocytic nevi of right lower limb, including hip: Secondary | ICD-10-CM | POA: Diagnosis not present

## 2020-06-14 DIAGNOSIS — D2261 Melanocytic nevi of right upper limb, including shoulder: Secondary | ICD-10-CM | POA: Diagnosis not present

## 2020-06-14 MED ORDER — "INSULIN SYRINGE 30G X 5/16"" 1 ML MISC": 1.0000 | Freq: Three times a day (TID) | 0 refills | Status: DC | PRN

## 2020-06-14 MED ORDER — INSULIN ASPART 100 UNIT/ML ~~LOC~~ SOLN: 5.0000 [IU] | Freq: Three times a day (TID) | SUBCUTANEOUS | 0 refills | Status: DC

## 2020-06-14 NOTE — Telephone Encounter (Signed)
No message

## 2020-06-14 NOTE — Telephone Encounter (Signed)
Pharmacy called asking for dx code for insulin and an rx for needles for her vial of insulin.

## 2020-06-14 NOTE — Telephone Encounter (Addendum)
Patient called back she is all confused about her insulin medication and what pharmacy it is suppose to go to would like a call back .

## 2020-06-14 NOTE — Telephone Encounter (Signed)
Spoken to pharmacy see other phone note.

## 2020-06-16 MED ORDER — NOVOLOG FLEXPEN 100 UNIT/ML ~~LOC~~ SOPN: 5.0000 [IU] | PEN_INJECTOR | Freq: Three times a day (TID) | SUBCUTANEOUS | 11 refills | Status: DC

## 2020-06-16 NOTE — Telephone Encounter (Signed)
Redone

## 2020-06-16 NOTE — Addendum Note (Signed)
Addended by: [name] on: 06/16/2020 09:54 AM   Modules accepted: Orders

## 2020-06-16 NOTE — Telephone Encounter (Signed)
[facility] called pt is now wanting the Flex Pen for Novolog  They also need a diagnosis code faxed to them b

## 2020-06-20 ENCOUNTER — Other Ambulatory Visit: Payer: Self-pay | Admitting: Internal Medicine

## 2020-06-21 ENCOUNTER — Other Ambulatory Visit: Payer: Self-pay

## 2020-06-23 ENCOUNTER — Ambulatory Visit: Payer: Medicare Other | Admitting: Pharmacist

## 2020-06-23 ENCOUNTER — Other Ambulatory Visit: Payer: Self-pay | Admitting: Internal Medicine

## 2020-06-23 ENCOUNTER — Other Ambulatory Visit: Payer: Self-pay

## 2020-06-23 DIAGNOSIS — I10 Essential (primary) hypertension: Secondary | ICD-10-CM

## 2020-06-23 DIAGNOSIS — D500 Iron deficiency anemia secondary to blood loss (chronic): Secondary | ICD-10-CM

## 2020-06-23 DIAGNOSIS — E1142 Type 2 diabetes mellitus with diabetic polyneuropathy: Secondary | ICD-10-CM

## 2020-06-23 DIAGNOSIS — E1140 Type 2 diabetes mellitus with diabetic neuropathy, unspecified: Secondary | ICD-10-CM

## 2020-06-23 DIAGNOSIS — R0600 Dyspnea, unspecified: Secondary | ICD-10-CM

## 2020-06-23 DIAGNOSIS — E1165 Type 2 diabetes mellitus with hyperglycemia: Secondary | ICD-10-CM

## 2020-06-23 DIAGNOSIS — R0609 Other forms of dyspnea: Secondary | ICD-10-CM

## 2020-06-23 DIAGNOSIS — I25110 Atherosclerotic heart disease of native coronary artery with unstable angina pectoris: Secondary | ICD-10-CM

## 2020-06-23 DIAGNOSIS — IMO0002 Reserved for concepts with insufficient information to code with codable children: Secondary | ICD-10-CM

## 2020-06-23 DIAGNOSIS — D508 Other iron deficiency anemias: Secondary | ICD-10-CM

## 2020-06-23 MED ORDER — FREESTYLE LIBRE 2 READER DEVI: 1 refills | Status: DC

## 2020-06-23 MED ORDER — NOVOLOG FLEXPEN 100 UNIT/ML ~~LOC~~ SOPN: PEN_INJECTOR | SUBCUTANEOUS | 11 refills | Status: DC

## 2020-06-23 MED ORDER — FREESTYLE LIBRE 14 DAY SENSOR MISC: 3 refills | Status: DC

## 2020-06-23 NOTE — Patient Instructions (Addendum)
Increase Lantus to 10 units daily   Increase Novolog to 8 units before meals (about 15-20 minutes before you start eating).   Continue Farxiga 10 mg daily.    Your  mammogram has been ordered.  You are encouraged (required) to call to make your appointment at [facility]  Visit Information  Patient Care Plan: Medication Management    Problem Identified: Diabetes, CHF     Long-Range Goal: Disease Progression Prevention   This Visit's Progress: On track  Priority: High  Note:   Current Barriers:  . Unable to achieve control of diabetes   Pharmacist Clinical Goal(s):  [name] Over the next 90 days, patient will achieve control of diabetes as evidenced by improvement in A1c through collaboration with PharmD and provider  Interventions: . Inter-disciplinary care team collaboration (see longitudinal plan of care) . Comprehensive medication review performed; medication list updated in electronic medical record  Health Maintenance: . Provided phone number for [place] to schedule mammogram . Reviewed lab results. Patient interested in referral for iron infusions per PCP. Note sent to PCP to ask for referral to be placed.   Diabetes: . Uncontrolled; current treatment: Farxiga 10 mg daily, Lantus 8 units daily, Novolog 5 units up to TID with meals  . Current glucose readings:  Date of Download: 11/26-12/9/12 % Time CGM is active: 46% Average Glucose: 179 mg/dL Glucose Variability: 30.4 (goal <36%) Time in Goal:  - Time in range 70-180: 65% - Time above range: 35% - Time below range: 0% Observed patterns: no patterns discernable d/t variability of scanning . Denies hyperglycemic symptoms, but is concerned about elevated readings . Current meal patterns: two meals per day; 1 late morning, 1 evening . Current exercise: limited by deconditioning . Reviewed pharmacokinetic principals of basal vs bolus insulin. Confirmed that patient is appropriately. injecting mealtime insulin  prior to meals.  . Increase Lantus to 10 units daily. Increase Novolog to 8 units with meals. Continue Farxiga 10 mg daily . Discussed that Elenor Legato 2 has low glucose alarm functions. Patient interested. Script for [employer] 2 + sensors sent to [facility].   Hyperlipidemia: . Controlled; current treatment: atorvastatin 40 mg daily  . Recommended to continue current regimen at this time  Depression/Anxiety with Insomnia: . Moderately well managed. Notes that sleep has been worse lately d/t "leg cramps". Current treatment: citalopram 10 mg daily, bupropion 150 mg QAM, 75 mg QPM, diazepam 5 mg QPM, Lunesta 3 mg QPM . Recommended to continue current regimen at this time  Chronic Diarrhea, S/p Gastric Bypass, Esophageal "growth" . Moderately well managed, given patient history. Current regimen: dicyclomine 10 mg QID PRN, hyoscyamine 10 mg QID PRN  Nocturnal "leg cramps" . More exacerbated lately, patient unsure why. Current regimen: pregabalin 100 mg QPM PRN, levetiracetam 500 mg QPM PRN . Recommended to continue current regimen at this time  COPD:  . Well controlled; Current regimen: Anoro 62.5/25 mcg daily, albuterol HFA PRN  . Continue current regimen at this time  Patient Goals/Self-Care Activities . Over the next 90 days, patient will:  - take medications as prescribed check blood glucose TID using CGm, document, and provide at future appointments  Follow Up Plan: Face to Face appointment with care management team member scheduled for:  ~ 5 weeks      Print copy of patient instructions, educational materials, and care plan provided in person.    Plan: Face to Face appointment with care management team member scheduled for: ~ 5 weeks  Catie  Darnelle Maffucci, PharmD, [place], [place] Clinical Pharmacist [place] [phone]

## 2020-06-23 NOTE — Chronic Care Management (AMB) (Signed)
Chronic Care Management   Pharmacy Note  06/23/2020 Name: Denise Sharp MRN: [id number] DOB: 1946/06/13   Subjective:  Denise Sharp is a 75 y.o. year old female who is a primary care patient of [name], Aris Everts, MD. The CCM team was consulted for assistance with chronic disease management and care coordination needs.    Engaged with patient face to face for follow up visit in response to provider referral for pharmacy case management and/or care coordination services.   Consent to Services:  Ms. Lippman was given information about Chronic Care Management services, agreed to services, and gave verbal consent prior to initiation of services on 03/15/20. Please see initial visit note for detailed documentation.   SDOH (Social Determinants of Health) assessments and interventions performed:  SDOH Interventions   Flowsheet Row Most Recent Value  SDOH Interventions   Financial Strain Interventions Intervention Not Indicated  Stress Interventions Other (Comment)  [encouraged continued medication adherence]  Transportation Interventions Intervention Not Indicated       Objective:  Lab Results  Component Value Date   CREATININE 1.20 05/24/2020   CREATININE 1.05 (H) 03/23/2020   CREATININE 1.11 (H) 01/25/2020    Lab Results  Component Value Date   HGBA1C 8.5 (H) 05/24/2020       Component Value Date/Time   CHOL 134 05/24/2020 1532   CHOL 155 02/27/2014 0420   TRIG 128.0 05/24/2020 1532   TRIG 121 02/27/2014 0420   HDL 60.40 05/24/2020 1532   HDL 43 02/27/2014 0420   CHOLHDL 2 05/24/2020 1532   VLDL 25.6 05/24/2020 1532   VLDL 24 02/27/2014 0420   LDLCALC 48 05/24/2020 1532   LDLCALC 88 02/27/2014 0420   LDLDIRECT 111.0 06/16/2015 0936     BP Readings from Last 3 Encounters:  06/07/20 (!) 142/76  05/18/20 124/70  03/23/20 (!) 141/60    Assessment/Interventions: Review of patient past medical history, allergies, medications, health status, including review  of consultants reports, laboratory and other test data, was performed as part of comprehensive evaluation and provision of chronic care management services.   Allergies  Allergen Reactions  . Demeclocycline Hives  . Erythromycin Nausea And Vomiting and Other (See Comments)    Severe irritable bowel  . Flagyl [Metronidazole] Nausea And Vomiting and Other (See Comments)    Severe irritable bowel  . Glucophage [Metformin Hcl] Nausea And Vomiting and Other (See Comments)    "Sick" "I won't take anything that has metformin in it"  . Tetracyclines & Related Hives and Rash  . Diovan [Valsartan] Nausea Only       . Sulfa Antibiotics Rash and Other (See Comments)    As child  . Xanax [Alprazolam] Other (See Comments)    Unknown reaction    Medications Reviewed Today    Reviewed by Adair Laundry, [place] (Certified Medical Assistant) on 06/07/20 at [facility] List Status: <None>  Medication Order Taking? Sig Documenting Provider Last Dose Status Informant  albuterol (VENTOLIN HFA) 108 (90 Base) MCG/ACT inhaler [id number] Yes Inhale 2 puffs into the lungs every 8 (eight) hours as needed for wheezing or shortness of breath. [name], MD Taking Active Self  aspirin EC 81 MG tablet [id number] Yes Take 1 tablet (81 mg total) by mouth daily. Fritzi Mandes, MD Taking Active   atorvastatin (LIPITOR) 40 MG tablet [id number] Yes Take 1 tablet (40 mg total) by mouth daily. Rise Mu, PA-C Taking Active   benzonatate (TESSALON) 200 MG capsule [id number] Yes Take 1 capsule (  200 mg total) by mouth 2 (two) times daily as needed for cough. [name], MD Taking Active   buPROPion [facility]) 75 MG tablet [id number] Yes TAKE 2 TABLETS BY MOUTH EVERY MORNING AND 1 TABLET IN THE AFTERNOON [name], MD Taking Active   butalbital-acetaminophen-caffeine (FIORICET) 50-325-40 MG tablet [id number] Yes TAKE 1 TABLET BY MOUTH EVERY 6 HOURS AS NEEDED [name], MD Taking Active   citalopram (CELEXA) 10 MG  tablet [id number] Yes Take 1 tablet (10 mg total) by mouth daily. [name], MD Taking Active   clopidogrel (PLAVIX) 75 MG tablet [id number] Yes Take 1 tablet (75 mg total) by mouth daily. Rise Mu, PA-C Taking Active   clotrimazole-betamethasone Donalynn Furlong) cream [id number] Yes Apply 1 application topically 2 (two) times daily.  Patient taking differently: Apply 1 application topically 2 (two) times daily as needed (rash).    Gae Dry, MD Taking Active Self  Continuous Blood Gluc Receiver (FREESTYLE LIBRE 14 DAY READER) [place]VI [id number] Yes 1 applicator by Does not apply route 4 (four) times daily. [name], MD Taking Active Self  Continuous Blood Gluc Sensor (FREESTYLE LIBRE [place]) [place] [id number] Yes Use to test blood sugar 4 times daily [name], MD Taking Active Self  cyanocobalamin (,VITAMIN B-12,) 1000 MCG/ML injection [id number] Yes Inject 1 ml (1000 mcg ) IM weekly x 4,  Then monthly thereafter  Patient taking differently: Inject 1,000 mcg into the muscle every 30 (thirty) days. On the 12th day   [name], MD Taking Active Self  dapagliflozin propanediol (FARXIGA) 10 MG TABS tablet [id number] Yes Take 1 tablet (10 mg total) by mouth daily before breakfast. [name], MD Taking Active   diazepam (VALIUM) 5 MG tablet [id number] Yes Take 1 tablet (5 mg total) by mouth at bedtime as needed for anxiety. [name], MD Taking Active   dicyclomine (BENTYL) 10 MG capsule [id number] Yes Take 1 capsule (10 mg total) by mouth 4 (four) times daily -  before meals and at bedtime. [name], MD Taking Active Self           Med Note Gillis Santa Dec 31, 2019  2:50 AM) Pt takes differently: take 1 capsule 4 times a day as needed.  diphenhydrAMINE (BENADRYL) 25 MG tablet [id number] Yes Take 50 mg by mouth daily as needed for allergies.  [provider] Taking Active Self  diphenoxylate-atropine (LOMOTIL) 2.5-0.025 MG tablet [id number] Yes  Take 1 tablet by mouth 4 (four) times daily as needed for diarrhea or loose stools. [name], MD Taking Active   DROPLET PEN NEEDLES 31G X 5 MM MISC [id number] Yes USE ONE NEEDLE SUBCUTANEOUSLY AS DIRECTED (REMOVE AND DISCARD NEEDLE IN SHARPS CONTAINER IMMEDIATELY AFTER USE) [name], MD Taking Active   Eszopiclone 3 MG TABS [id number] Yes Take 1 tablet (3 mg total) by mouth at bedtime. [name], MD Taking Active   hyoscyamine (ANASPAZ) 0.125 MG TBDP disintergrating tablet [id number] Yes DISSOLVE UNDER TONGUE 15 MINUTES BEFORE MEALS [name], MD Taking Active   Insulin Glargine (LANTUS SOLOSTAR) 100 UNIT/ML Solostar Pen [id number] Yes Inject 11 Units into the skin daily.  Patient taking differently: Inject 10-15 Units into the skin daily.    [name], MD Taking Active Self           Med Note Lula Olszewski May 24, 2020  3:06 PM) 8  units  ipratropium (ATROVENT) 0.06 % nasal spray [id number] Yes Place 2 sprays into both nostrils 4 (four) times daily. [name], MD Taking Active Self           Med Note Gillis Santa Dec 31, 2019  2:55 AM) Pt takes differently: Place 2 sprays into each nostril 4 times daily as needed.  levETIRAcetam (KEPPRA) 750 MG tablet [id number] Yes Take 1 tablet (750 mg total) by mouth at bedtime. Kathrynn Ducking, MD Taking Active Self           Med Note Gillis Santa Dec 31, 2019  2:58 AM) Pt takes differently: Take 1 tablet at bedtime as needed.  loperamide (IMODIUM) 2 MG capsule [id number] Yes Take by mouth. [provider] Taking Active   methocarbamol (ROBAXIN) 500 MG tablet [id number] Yes Take 500 mg by mouth at bedtime. [provider] Taking Active   neomycin-polymyxin-hydrocortisone (CORTISPORIN) OTIC solution [id number] Yes Place 3 drops into both ears 4 (four) times daily. For otitis externa [name], MD Taking Active   nitroGLYCERIN (NITROSTAT) 0.4 MG SL tablet [id number] Yes Place 1  tablet (0.4 mg total) under the tongue every 5 (five) minutes as needed for chest pain. Fritzi Mandes, MD Taking Active   NONFORMULARY OR COMPOUNDED [place] [id number] Yes [facility]  Combination Pain Cream -  Baclofen 2%, Doxepin 5%, Gabapentin 6%, Topiramate 2%, Pentoxifylline 3% Apply 1-2 grams to affected area 3-4 times daily Qty. 120 gm 3 refills Gardiner Barefoot, DPM Taking Active Self           Med Note Rocky Crafts   Thu Dec 31, 2019  3:01 AM) Pt takes differently: Apply 3-4 times daily as needed.  nystatin [facility]) powder [id number] Yes Apply 1 g topically 4 (four) times daily as needed (rash).  [provider] Taking Active Self  ondansetron (ZOFRAN ODT) 4 MG disintegrating tablet [id number] Yes Take 1 tablet (4 mg total) by mouth every 8 (eight) hours as needed for nausea or vomiting. [name], MD Taking Active Self  OVER THE COUNTER MEDICATION [id number] Yes Apply 1 application topically daily as needed (pain). [place] Gell [provider] Taking Active Self  pregabalin (LYRICA) 100 MG capsule [id number] Yes Take 100 mg by mouth at bedtime as needed (leg cramps).  [provider] Taking Active Self           Med Note Caryn Section, KYLE A   Fri Jun 05, 2019  9:41 AM)    traMADol (ULTRAM) 50 MG tablet [id number] Yes Take 0.5 tablets (25 mg total) by mouth every 12 (twelve) hours as needed for severe pain. Elmarie Shiley, MD Taking Active Self           Med Note Rocky Crafts   Thu Dec 31, 2019  3:03 AM) Pt takes differently: Take 1 tablet (50 mg) every 12 hours as needed for severe pain.  umeclidinium-vilanterol (ANORO ELLIPTA) 62.5-25 MCG/INH AEPB [id number] Yes Inhale 1 puff into the lungs daily. [name], MD Taking Active   Med List Note Jaymes Graff 07/22/17 1307): CPAP          Patient Active Problem List   Diagnosis Date Noted  . Skin lesion of chest wall 06/10/2020  . Elevated alkaline phosphatase level 06/10/2020  .  Coagulation defect ([place]) 06/06/2020  . Chronic heart failure with preserved ejection fraction (HFpEF) ([place]) 01/14/2020  . Hyperlipidemia LDL goal <70 01/14/2020  .  Coronary artery disease involving native coronary artery of native heart without angina pectoris 01/13/2020  . S/P cardiac catheterization 01/13/2020  . Angina at rest [facility]) 12/31/2019  . Dysphagia 11/30/2019  . Gastritis 08/07/2019  . Lab test negative for COVID-19 virus 08/07/2019  . S/P panniculectomy 06/16/2019  . Major depressive disorder with current active episode 05/30/2019  . Exposure to COVID-19 virus 12/25/2018  . Functional diarrhea 10/22/2018  . Bilateral cataracts 06/26/2018  . Status post bariatric surgery 02/05/2018  . GAD (generalized anxiety disorder) 02/01/2018  . Rectocele 01/24/2018  . Vaginal prolapse 01/04/2018  . Hepatic steatosis 01/04/2018  . Microalbuminuria due to type 2 diabetes mellitus ([place]) 11/21/2017  . Hospital discharge follow-up 09/22/2017  . Hypocalcemia 09/22/2017  . Hypotension 09/22/2017  . Fecal incontinence 09/22/2017  . B12 deficiency 09/22/2017  . Rotator cuff arthropathy, right 08/13/2017  . Primary localized osteoarthrosis of shoulder 08/13/2017  . Encounter for preoperative examination for general surgical procedure 08/03/2017  . 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 Candidiasis, intertrigo 08/03/2017  . 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 Edema 12/13/2015  . Hypersomnia, persistent 06/18/2015  . Mechanical breakdown of implanted electronic neurostimulator of peripheral nerve ([place]) 10/16/2014  . Obesity hypoventilation syndrome ([place]) 08/13/2014  . Frequent falls 06/23/2014  . Chronic cough 05/05/2014  . Adenoma of left adrenal gland 03/24/2014  . Vitamin D deficiency 01/06/2014  . Weight gain following gastric bypass surgery 12/03/2013  . Morbid obesity ([place]) 08/25/2013  . Diaphragmatic hernia 04/06/2013   . Diabetic polyneuropathy ([place]) 03/25/2013  . Abnormality of gait 03/25/2013  . Multiple pulmonary nodules 03/20/2013  . Dyspnea on exertion 03/20/2013  . Anemia 08/06/2012  . Iron deficiency anemia 08/06/2012  . Functional disorder of bladder 07/15/2012  . Incomplete bladder emptying 07/15/2012  . 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 Urge incontinence 07/15/2012  . Increased frequency of urination 07/15/2012  . OSA on CPAP 03/02/2012  . Insomnia secondary to anxiety 02/29/2012  . Sciatica 09/05/2011  . Cervical stenosis of spinal canal 06/14/2011  . Restless legs syndrome 03/13/2011  . Degenerative disk disease 03/13/2011  . Essential hypertension 03/12/2011  . Uncontrolled type 2 diabetes with neuropathy ([place]) 03/12/2011  . Hearing loss, right 03/12/2011    Medication Assistance: None required. Patient affirms current coverage meets needs.   Patient Care Plan: Medication Management    Problem Identified: Diabetes, CHF     Long-Range Goal: Disease Progression Prevention   This Visit's Progress: On track  Priority: High  Note:   Current Barriers:  . Unable to achieve control of diabetes   Pharmacist Clinical Goal(s):  [name] Over the next 90 days, patient will achieve control of diabetes as evidenced by improvement in A1c through collaboration with PharmD and provider  Interventions: . Inter-disciplinary care team collaboration (see longitudinal plan of care) . Comprehensive medication review performed; medication list updated in electronic medical record  Health Maintenance: . Provided phone number for [place] to schedule mammogram . Reviewed lab results. Patient interested in referral for iron infusions per PCP. Note sent to PCP to ask for referral to be placed.   Diabetes: . Uncontrolled; current treatment: Farxiga 10 mg daily, Lantus 8 units daily, Novolog 5 units up to TID with meals  . Current glucose readings:  Date of Download: 11/26-12/9/12 % Time  CGM is active: 46% Average Glucose: 179 mg/dL Glucose Variability: 30.4 (goal <36%) Time in Goal:  - Time in range 70-180: 65% - Time  above range: 35% - Time below range: 0% Observed patterns: no patterns discernable d/t variability of scanning . Denies hyperglycemic symptoms, but is concerned about elevated readings . Current meal patterns: two meals per day; 1 late morning, 1 evening . Current exercise: limited by deconditioning . Reviewed pharmacokinetic principals of basal vs bolus insulin. Confirmed that patient is appropriately. injecting mealtime insulin prior to meals.  . Increase Lantus to 10 units daily. Increase Novolog to 8 units with meals. Continue Farxiga 10 mg daily . Discussed that Elenor Legato 2 has low glucose alarm functions. Patient interested. Script for [employer] 2 + sensors sent to [employer] [facility].   Hyperlipidemia: . Controlled; current treatment: atorvastatin 40 mg daily  . Recommended to continue current regimen at this time  Depression/Anxiety with Insomnia: . Moderately well managed. Notes that sleep has been worse lately d/t "leg cramps". Current treatment: citalopram 10 mg daily, bupropion 150 mg QAM, 75 mg QPM, diazepam 5 mg QPM, Lunesta 3 mg QPM . Recommended to continue current regimen at this time  Chronic Diarrhea, S/p Gastric Bypass, Esophageal "growth" . Moderately well managed, given patient history. Current regimen: dicyclomine 10 mg QID PRN, hyoscyamine 10 mg QID PRN  Nocturnal "leg cramps" . More exacerbated lately, patient unsure why. Current regimen: pregabalin 100 mg QPM PRN, levetiracetam 500 mg QPM PRN . Recommended to continue current regimen at this time  COPD:  . Well controlled; Current regimen: Anoro 62.5/25 mcg daily, albuterol HFA PRN  . Continue current regimen at this time  Patient Goals/Self-Care Activities . Over the next 90 days, patient will:  - take medications as prescribed check blood glucose TID using CGm, document, and provide at  future appointments  Follow Up Plan: Face to Face appointment with care management team member scheduled for:  ~ 5 weeks       Plan: Face to Face appointment with care management team member scheduled for: ~ 5 weeks  Catie Darnelle Maffucci, PharmD, [place], CPP Clinical Pharmacist [place] [phone]

## 2020-06-27 ENCOUNTER — Ambulatory Visit: Payer: Medicare Other | Admitting: Internal Medicine

## 2020-06-28 ENCOUNTER — Inpatient Hospital Stay: Payer: Medicare Other

## 2020-06-28 ENCOUNTER — Inpatient Hospital Stay: Payer: Medicare Other | Attending: Internal Medicine | Admitting: Internal Medicine

## 2020-06-28 ENCOUNTER — Encounter: Payer: Self-pay | Admitting: Internal Medicine

## 2020-06-28 ENCOUNTER — Other Ambulatory Visit: Payer: Self-pay

## 2020-06-28 DIAGNOSIS — K589 Irritable bowel syndrome without diarrhea: Secondary | ICD-10-CM | POA: Diagnosis not present

## 2020-06-28 DIAGNOSIS — F32A Depression, unspecified: Secondary | ICD-10-CM | POA: Insufficient documentation

## 2020-06-28 DIAGNOSIS — R51 Headache with orthostatic component, not elsewhere classified: Secondary | ICD-10-CM | POA: Diagnosis not present

## 2020-06-28 DIAGNOSIS — I200 Unstable angina: Secondary | ICD-10-CM | POA: Diagnosis not present

## 2020-06-28 DIAGNOSIS — D509 Iron deficiency anemia, unspecified: Secondary | ICD-10-CM | POA: Insufficient documentation

## 2020-06-28 DIAGNOSIS — Z90722 Acquired absence of ovaries, bilateral: Secondary | ICD-10-CM | POA: Diagnosis not present

## 2020-06-28 DIAGNOSIS — E611 Iron deficiency: Secondary | ICD-10-CM | POA: Diagnosis not present

## 2020-06-28 DIAGNOSIS — R1909 Other intra-abdominal and pelvic swelling, mass and lump: Secondary | ICD-10-CM | POA: Diagnosis not present

## 2020-06-28 DIAGNOSIS — Z79899 Other long term (current) drug therapy: Secondary | ICD-10-CM | POA: Diagnosis not present

## 2020-06-28 DIAGNOSIS — E538 Deficiency of other specified B group vitamins: Secondary | ICD-10-CM | POA: Insufficient documentation

## 2020-06-28 DIAGNOSIS — R5383 Other fatigue: Secondary | ICD-10-CM | POA: Insufficient documentation

## 2020-06-28 DIAGNOSIS — Z90710 Acquired absence of both cervix and uterus: Secondary | ICD-10-CM | POA: Diagnosis not present

## 2020-06-28 DIAGNOSIS — Z9884 Bariatric surgery status: Secondary | ICD-10-CM | POA: Insufficient documentation

## 2020-06-28 DIAGNOSIS — I447 Left bundle-branch block, unspecified: Secondary | ICD-10-CM | POA: Insufficient documentation

## 2020-06-28 DIAGNOSIS — E1140 Type 2 diabetes mellitus with diabetic neuropathy, unspecified: Secondary | ICD-10-CM | POA: Insufficient documentation

## 2020-06-28 DIAGNOSIS — I10 Essential (primary) hypertension: Secondary | ICD-10-CM | POA: Diagnosis not present

## 2020-06-28 DIAGNOSIS — R42 Dizziness and giddiness: Secondary | ICD-10-CM | POA: Insufficient documentation

## 2020-06-28 DIAGNOSIS — Z8041 Family history of malignant neoplasm of ovary: Secondary | ICD-10-CM | POA: Diagnosis not present

## 2020-06-28 DIAGNOSIS — G4730 Sleep apnea, unspecified: Secondary | ICD-10-CM | POA: Diagnosis not present

## 2020-06-28 DIAGNOSIS — R531 Weakness: Secondary | ICD-10-CM | POA: Insufficient documentation

## 2020-06-28 NOTE — Assessment & Plan Note (Addendum)
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  Recommend IV iron infusion.  Previously no reactions.  #B12 deficiency secondary gastric bypass at home iron injections.  Thank you Dr.Tullo for allowing me to participate in the care of your pleasant patient. Please do not hesitate to contact me with questions or concerns in the interim.  # DISPOSITION: # IV ferrahem weekly x 4 # follow up in 2 months; MD;labs- cbc/bmp; ldh; possible Ferrahem- Dr.B

## 2020-06-28 NOTE — Progress Notes (Signed)
Pt states she is extremely SOB. Very tired, weak and low energy. Can not walk long distances without giving out. Having nose bleeds over a week. Has dizziness and light headedness often.

## 2020-06-28 NOTE — Progress Notes (Signed)
[place] NOTE  Patient Care Team: [name], MD as PCP - General (Internal Medicine) End, Harrell Gave, MD as PCP - Cardiology (Cardiology) Chiropractic, Freddi Che as Referring Physician De Hollingshead, RPH-CPP (Pharmacist)  CHIEF COMPLAINTS/PURPOSE OF CONSULTATION:    HEMATOLOGY HISTORY  # IRON DEFICIENT ANEMIA [place] TO [place]; 2018 IV iron infusion [Dr. Finnegan] EGD/colonoscopy- ?2021 [Dr.Wohl]; NOV 2021-hemoglobin 10.  Iron saturation 5% ferritin 12.  # GASTRIC BYPASS x2 [4098]; CAD [s/p stent; Dr.Arida]  HISTORY OF PRESENTING ILLNESS:  Denise Sharp 74 y.o.  female has been referred to [place] for further evaluation/work-up for anemia.  Patient stated that she has been treated with iron infusions in the past because of iron absorption from gastric bypass.  Her last infusions were more than 3 years ago.  Blood in stools: None Change in bowel habits- None Blood in urine: None Difficulty swallowing: None Abnormal weight loss: None Iron supplementation: Prior Blood transfusions:   Vaginal bleeding: None   Review of Systems  Constitutional: Positive for malaise/fatigue. Negative for chills, diaphoresis, fever and weight loss.  HENT: Negative for nosebleeds and sore throat.   Eyes: Negative for double vision.  Respiratory: Negative for cough, hemoptysis, sputum production, shortness of breath and wheezing.   Cardiovascular: Negative for chest pain, palpitations, orthopnea and leg swelling.  Gastrointestinal: Positive for diarrhea. Negative for abdominal pain, blood in stool, constipation, heartburn, melena, nausea and vomiting.  Genitourinary: Negative for dysuria, frequency and urgency.  Musculoskeletal: Positive for back pain. Negative for joint pain.  Skin: Negative.  Negative for itching and rash.  Neurological: Negative for dizziness, tingling, focal weakness, weakness and headaches.  Endo/Heme/Allergies: Does not bruise/bleed  easily.  Psychiatric/Behavioral: Negative for depression. The patient is not nervous/anxious and does not have insomnia.     MEDICAL HISTORY:  Past Medical History:  Diagnosis Date  . Abnormality of gait 03/25/2013  . Adrenal mass, left ([place])   . Anemia    iron deficient post  2 unit txfsn 2009, normal endo/colon[facility] by [facility]  . Arthritis   . Atypical chest pain    a. 04/1986 Cath ([facility]): nl cors, EF 65%; b. 07/2012 Cath [facility]): LM nl, LAD/LCX/RCA w/ min irregs; c. 07/2016 MV [facility]): "Equivocal" w/ small inf wall rev defect and hyperdynamic LV contraction, EF 87%; d. 08/2016 Cardiac CT Ca2+ score [facility]): Ca2+ score 1548; e. 05/2019 MV: No ischemia. Diaph atten. EF 75%.  . Cervical spinal stenosis 1994   due to trauma to back ([employer] accident), has intermittent paralysis and parasthesias  . Cervicogenic headache 03/23/2014  . Depression   . Diastolic dysfunction    a. 12/2019 Echo: EF 65-70%, Gr2 DD. No significant valvular dzs.  . Dizziness    chronic dizziness  . DJD (degenerative joint disease)    a. Chronic R shoulder pain pending R shoulder replacement 07/2017.  [name] Esophageal stenosis March 2011   with transietn outlet obstruction by food, cleared by EGD   . Family history of adverse reaction to anesthesia    daughter PONV  . Gastric bypass status for obesity   . Headache(784.0)   . Hypertension   . IBS (irritable bowel syndrome)   . Left bundle branch block (LBBB)    a. Intermittently present - likely rate related. - patient denies  . Obesity   . Obstructive sleep apnea    using CPAP  . 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 Restless leg syndrome   . Rotator cuff arthropathy, right 08/13/2017  . Syncope  and collapse 03/12/2014  . Type II diabetes mellitus ([place])     SURGICAL HISTORY: Past Surgical History:  Procedure Laterality Date  . ABDOMINAL HYSTERECTOMY    . APPENDECTOMY    . COLONOSCOPY    . CORONARY STENT INTERVENTION N/A 01/04/2020   Procedure: CORONARY  STENT INTERVENTION;  Surgeon: [name], MD;  Location: [place] [facility];  Service: Cardiovascular;  Laterality: N/A;  LAD   . DIAPHRAGMATIC HERNIA REPAIR  2015  . ESOPHAGEAL DILATION     multiple  . ESOPHAGOGASTRODUODENOSCOPY    . [place]  . GALLBLADDER SURGERY  resection  . GASTRIC BYPASS    . GASTRIC BYPASS  2000, 2005   Dr. Debroah Loop  . [facility] IMPLANT PLACEMENT  April 2013   [place]  . JOINT REPLACEMENT  2007   bilateral knee. [name],  [name]  . LEFT HEART CATH AND CORS/GRAFTS ANGIOGRAPHY N/A 12/31/2019   Procedure: LEFT HEART CATH AND CORS/GRAFTS ANGIOGRAPHY;  Surgeon: [name], MD;  Location: [place] [facility];  Service: Cardiovascular;  Laterality: N/A;  . PANNICULECTOMY  06/16/2019  . PANNICULECTOMY N/A 06/16/2019   Procedure: PANNICULECTOMY;  Surgeon: [name], MD;  Location: [place];  Service: Plastics;  Laterality: N/A;  3 hours, please  . REVERSE SHOULDER ARTHROPLASTY Right 08/13/2017   Procedure: REVERSE RIGHT SHOULDER ARTHROPLASTY;  Surgeon: [name], MD;  Location: [place];  Service: Orthopedics;  Laterality: Right;  . ROTATOR CUFF REPAIR     right  . [place]  . TOTAL ABDOMINAL HYSTERECTOMY W/ BILATERAL SALPINGOOPHORECTOMY  1974  . UMBILICAL HERNIA REPAIR  Aug 11, 2015    SOCIAL HISTORY: Social History   Socioeconomic History  . Marital status: Married    Spouse name: [name]   . Number of children: 2  . Years of education: College   . Highest education level: Not on file  Occupational History    Employer: retired  Tobacco Use  . Smoking status: Never Smoker  . Smokeless tobacco: Never Used  Vaping Use  . Vaping Use: Never used  Substance and Sexual Activity  . Alcohol use: No  . Drug use: No  . Sexual activity: Not Currently    Birth control/protection: Post-menopausal, Surgical  Other Topics Concern  . Not on file  Social History Narrative   Patient lives at home with husband [name].  Patient has 2 children. Patient has [employer]. Patient is on disability; retd RN; Patient is right handed.    Social Determinants of Health   Financial Resource Strain: Low Risk   . Difficulty of Paying Living Expenses: Not hard at all  Food Insecurity: Not on file  Transportation Needs: No Transportation Needs  . Lack of Transportation (Medical): No  . Lack of Transportation (Non-Medical): No  Physical Activity: Not on file  Stress: Stress Concern Present  . Feeling of Stress : To some extent  Social Connections: Not on file  Intimate Partner Violence: Not on file    FAMILY HISTORY: Family History  Problem Relation Age of Onset  . Heart disease Father   . Hypertension Father   . Prostate cancer Father   . Stroke Father   . Osteoporosis Father   . Stroke Mother   . Depression Mother   . Headache Mother   . Heart disease Mother   . Thyroid disease Mother   . Hypertension Mother   . Diabetes Daughter   . Heart disease Daughter   .  Hypertension Daughter   . Hypertension Son     ALLERGIES:  is allergic to demeclocycline, erythromycin, flagyl [metronidazole], glucophage [metformin hcl], tetracyclines & related, diovan [valsartan], sulfa antibiotics, and xanax [alprazolam].  MEDICATIONS:  Current Outpatient Medications  Medication Sig Dispense Refill  . albuterol (VENTOLIN HFA) 108 (90 Base) MCG/ACT inhaler Inhale 2 puffs into the lungs every 8 (eight) hours as needed for wheezing or shortness of breath. 8 g 2  . aspirin EC 81 MG tablet Take 1 tablet (81 mg total) by mouth daily. 30 tablet 2  . atorvastatin (LIPITOR) 40 MG tablet Take 1 tablet (40 mg total) by mouth daily. 90 tablet 0  . benzonatate (TESSALON) 200 MG capsule Take 1 capsule (200 mg total) by mouth 2 (two) times daily as needed for cough. 30 capsule 5  . buPROPion (WELLBUTRIN) 75 MG tablet TAKE 2 TABLETS BY MOUTH EVERY MORNING AND 1 TABLET IN THE AFTERNOON 270 tablet 1  .  butalbital-acetaminophen-caffeine (FIORICET) 50-325-40 MG tablet TAKE 1 TABLET BY MOUTH EVERY 6 HOURS AS NEEDED 60 tablet 5  . citalopram (CELEXA) 10 MG tablet Take 1 tablet (10 mg total) by mouth daily. 90 tablet 1  . clopidogrel (PLAVIX) 75 MG tablet Take 1 tablet (75 mg total) by mouth daily. 90 tablet 0  . clotrimazole-betamethasone (LOTRISONE) cream Apply 1 application topically 2 (two) times daily. (Patient taking differently: Apply 1 application topically 2 (two) times daily as needed (rash).) 45 g 3  . Continuous Blood Gluc Receiver (FREESTYLE LIBRE 2 READER) DEVI Use to check sugar at least TID 1 each 1  . Continuous Blood Gluc Sensor (FREESTYLE LIBRE 14 DAY SENSOR) MISC Use to check sugar at least 3 times daily 6 each 3  . cyanocobalamin (,VITAMIN B-12,) 1000 MCG/ML injection Inject 1 ml (1000 mcg ) IM weekly x 4,  Then monthly thereafter (Patient taking differently: Inject 1,000 mcg into the muscle every 30 (thirty) days. On the 12th day) 110 mL 0  . dapagliflozin propanediol (FARXIGA) 10 MG TABS tablet Take 1 tablet (10 mg total) by mouth daily before breakfast. 90 tablet 3  . diazepam (VALIUM) 5 MG tablet Take 1 tablet (5 mg total) by mouth at bedtime as needed for anxiety. 30 tablet 5  . dicyclomine (BENTYL) 10 MG capsule Take 1 capsule (10 mg total) by mouth 4 (four) times daily -  before meals and at bedtime. 360 capsule 3  . diphenhydrAMINE (BENADRYL) 25 MG tablet Take 50 mg by mouth daily as needed for allergies.     . diphenoxylate-atropine (LOMOTIL) 2.5-0.025 MG tablet Take 1 tablet by mouth 4 (four) times daily as needed for diarrhea or loose stools. 60 tablet 5  . Eszopiclone 3 MG TABS Take 1 tablet (3 mg total) by mouth at bedtime. 90 tablet 1  . hyoscyamine (ANASPAZ) 0.125 MG TBDP disintergrating tablet DISSOLVE UNDER TONGUE 15 MINUTES BEFORE MEALS 360 tablet 1  . insulin aspart (NOVOLOG FLEXPEN) 100 UNIT/ML FlexPen Inject 8 units up to 3 times daily before meals 15 mL 11  .  Insulin Glargine (LANTUS SOLOSTAR) 100 UNIT/ML Solostar Pen Inject 11 Units into the skin daily. (Patient taking differently: Inject 10-15 Units into the skin daily.) 5 pen 3  . Insulin Pen Needle (DROPLET PEN NEEDLES) 31G X 5 MM MISC USE ONE NEEDLE SUBCUTANEOUSLY AS DIRECTED (REMOVE AND DISCARD NEEDLE IN SHARPS CONTAINER IMMEDIATELY AFTER USE) 100 each 1  . Insulin Syringe-Needle U-100 (INSULIN SYRINGE 1CC/30GX5/16") 30G X 5/16" 1 ML MISC 1 Stick by  Does not apply route 3 (three) times daily as needed. DX.E11.65 100 each 0  . ipratropium (ATROVENT) 0.06 % nasal spray Place 2 sprays into both nostrils 4 (four) times daily. 15 mL 12  . levETIRAcetam (KEPPRA) 750 MG tablet Take 1 tablet (750 mg total) by mouth at bedtime. 90 tablet 3  . loperamide (IMODIUM) 2 MG capsule Take by mouth.    . methocarbamol (ROBAXIN) 500 MG tablet Take 500 mg by mouth at bedtime.    [name] neomycin-polymyxin-hydrocortisone (CORTISPORIN) OTIC solution Place 3 drops into both ears 4 (four) times daily. For otitis externa 10 mL 0  . nitroGLYCERIN (NITROSTAT) 0.4 MG SL tablet Place 1 tablet (0.4 mg total) under the tongue every 5 (five) minutes as needed for chest pain. 20 tablet 1  . NONFORMULARY OR COMPOUNDED [place]  Combination Pain Cream -  Baclofen 2%, Doxepin 5%, Gabapentin 6%, Topiramate 2%, Pentoxifylline 3% Apply 1-2 grams to affected area 3-4 times daily Qty. 120 gm 3 refills 1 each 3  . nystatin (MYCOSTATIN/NYSTOP) powder Apply 1 g topically 4 (four) times daily as needed (rash).     . ondansetron (ZOFRAN ODT) 4 MG disintegrating tablet Take 1 tablet (4 mg total) by mouth every 8 (eight) hours as needed for nausea or vomiting. 20 tablet 0  . OVER THE COUNTER MEDICATION Apply 1 application topically daily as needed (pain). [place] Gell    . pregabalin (LYRICA) 100 MG capsule Take 100 mg by mouth at bedtime as needed (leg cramps).     . traMADol (ULTRAM) 50 MG tablet Take 0.5 tablets (25 mg total) by mouth  every 12 (twelve) hours as needed for severe pain. 30 tablet 0  . umeclidinium-vilanterol (ANORO ELLIPTA) 62.5-25 MCG/INH AEPB Inhale 1 puff into the lungs daily. 60 each 3   No current facility-administered medications for this visit.      PHYSICAL EXAMINATION:   Vitals:   06/28/20 1409  BP: (!) 142/59  Pulse: 96  Resp: 18  Temp: 98.3 F (36.8 C)  SpO2: 97%   Filed Weights   06/28/20 1409  Weight: 243 lb (110.2 kg)    Physical Exam Constitutional:      Comments: Alone; ambulating independently.   HENT:     Head: Normocephalic and atraumatic.     Mouth/Throat:     Mouth: Oropharynx is clear and moist.     Pharynx: No oropharyngeal exudate.  Eyes:     Pupils: Pupils are equal, round, and reactive to light.  Cardiovascular:     Rate and Rhythm: Normal rate and regular rhythm.     Pulses: Normal pulses.     Heart sounds: Normal heart sounds.  Pulmonary:     Effort: No respiratory distress.     Breath sounds: No wheezing.  Abdominal:     General: Bowel sounds are normal. There is no distension.     Palpations: Abdomen is soft. There is no mass.     Tenderness: There is no abdominal tenderness. There is no guarding or rebound.  Musculoskeletal:        General: No tenderness or edema. Normal range of motion.     Cervical back: Normal range of motion and neck supple.  Skin:    General: Skin is warm.  Neurological:     Mental Status: She is alert and oriented to person, place, and time.  Psychiatric:        Mood and Affect: Affect normal.     LABORATORY DATA:  I  have reviewed the data as listed Lab Results  Component Value Date   WBC 11.1 (H) 06/07/2020   HGB 10.1 (L) 06/07/2020   HCT 32.8 (L) 06/07/2020   MCV 74.8 (L) 06/07/2020   PLT 420.0 (H) 06/07/2020   Recent Labs    11/03/19 1500 11/09/19 1513 01/05/20 0656 01/13/20 1630 01/25/20 1203 03/23/20 1204 05/24/20 1532 06/07/20 1546  NA 139   < > 140   < > 139 140 140  --   K 3.9   < > 4.1   < >  3.9 4.3 3.7  --   CL 105   < > 107   < > 107 103 102  --   CO2 23   < > 25   < > 20* 26 24  --   GLUCOSE 161*   < > 146*   < > 187* 165* 135*  --   BUN 15   < > 14   < > 17 10 9   --   CREATININE 1.00   < > 1.20*   < > 1.11* 1.05* 1.20  --   CALCIUM 8.7   < > 8.3*   < > 8.8* 8.9 8.4  --   GFRNONAA  --    < > 44*  --  49* 52*  --   --   GFRAA  --    < > 52*  --  57* >60  --   --   PROT 7.1  --   --   --   --   --  6.8 6.9  ALBUMIN 3.6  --   --   --   --   --  3.6 3.6  AST 14  --   --   --   --   --  12 12  ALT 10  --   --   --   --   --  10 9  ALKPHOS 133*  --   --   --   --   --  136* 153*  134*  BILITOT 0.3  --   --   --   --   --  0.3 0.3  BILIDIR  --   --   --   --   --   --   --  0.1   < > = values in this interval not displayed.     No results found.  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  Recommend IV iron infusion.  Previously no reactions.  #B12 deficiency secondary gastric bypass at home iron injections.  Thank you Dr.Tullo for allowing me to participate in the care of your pleasant patient. Please do not hesitate to contact me with questions or concerns in the interim.  # DISPOSITION: # IV ferrahem weekly x 4 # follow up in 2 months; MD;labs- cbc/bmp; ldh; possible Ferrahem- Dr.B  All questions were answered. The patient knows to call the clinic with any problems, questions or concerns.    Cammie Sickle, MD 06/28/2020 9:00 PM

## 2020-07-04 ENCOUNTER — Telehealth: Payer: Self-pay | Admitting: Internal Medicine

## 2020-07-04 NOTE — Telephone Encounter (Signed)
Pt would like a call back about her medication patches and where they need to go for it to be covered under insurance

## 2020-07-05 ENCOUNTER — Ambulatory Visit: Payer: Medicare Other | Admitting: Pharmacist

## 2020-07-05 DIAGNOSIS — I25110 Atherosclerotic heart disease of native coronary artery with unstable angina pectoris: Secondary | ICD-10-CM

## 2020-07-05 DIAGNOSIS — E1140 Type 2 diabetes mellitus with diabetic neuropathy, unspecified: Secondary | ICD-10-CM

## 2020-07-05 DIAGNOSIS — E1165 Type 2 diabetes mellitus with hyperglycemia: Secondary | ICD-10-CM

## 2020-07-05 DIAGNOSIS — I10 Essential (primary) hypertension: Secondary | ICD-10-CM

## 2020-07-05 DIAGNOSIS — IMO0002 Reserved for concepts with insufficient information to code with codable children: Secondary | ICD-10-CM

## 2020-07-05 NOTE — Chronic Care Management (AMB) (Signed)
Chronic Care Management   Pharmacy Note  07/05/2020 Name: Denise Sharp MRN: [id number] DOB: 1946/01/11  Subjective:  Denise Sharp is a 74 y.o. year old female who is a primary care patient of [name], Aris Everts, MD. The CCM team was consulted for assistance with chronic disease management and care coordination needs.    Engaged with patient by telephone for device access question   in response to provider referral for pharmacy case management and/or care coordination services.   Consent to Services:  Denise Sharp was given information about Chronic Care Management services, agreed to services, and gave verbal consent prior to initiation of services on 03/15/20. Please see initial visit note for detailed documentation.   Objective:  Lab Results  Component Value Date   CREATININE 1.20 05/24/2020   CREATININE 1.05 (H) 03/23/2020   CREATININE 1.11 (H) 01/25/2020    Lab Results  Component Value Date   HGBA1C 8.5 (H) 05/24/2020       Component Value Date/Time   CHOL 134 05/24/2020 1532   CHOL 155 02/27/2014 0420   TRIG 128.0 05/24/2020 1532   TRIG 121 02/27/2014 0420   HDL 60.40 05/24/2020 1532   HDL 43 02/27/2014 0420   CHOLHDL 2 05/24/2020 1532   VLDL 25.6 05/24/2020 1532   VLDL 24 02/27/2014 0420   LDLCALC 48 05/24/2020 1532   LDLCALC 88 02/27/2014 0420   LDLDIRECT 111.0 06/16/2015 0936    BP Readings from Last 3 Encounters:  06/28/20 (!) 142/59  06/07/20 (!) 142/76  05/18/20 124/70    Assessment/Interventions: Review of patient past medical history, allergies, medications, health status, including review of consultants reports, laboratory and other test data, was performed as part of comprehensive evaluation and provision of chronic care management services.   SDOH (Social Determinants of Health) assessments and interventions performed:    CCM Care Plan  Allergies  Allergen Reactions  . Demeclocycline Hives  . Erythromycin Nausea And Vomiting and  Other (See Comments)    Severe irritable bowel  . Flagyl [Metronidazole] Nausea And Vomiting and Other (See Comments)    Severe irritable bowel  . Glucophage [Metformin Hcl] Nausea And Vomiting and Other (See Comments)    "Sick" "I won't take anything that has metformin in it"  . Tetracyclines & Related Hives and Rash  . Diovan [Valsartan] Nausea Only       . Sulfa Antibiotics Rash and Other (See Comments)    As child  . Xanax [Alprazolam] Other (See Comments)    Unknown reaction    Medications Reviewed Today    Reviewed by Lorrin Jackson, CMA (Certified Medical Assistant) on 06/28/20 at 1428  Med List Status: <None>  Medication Order Taking? Sig Documenting Provider Last Dose Status Informant  albuterol (VENTOLIN HFA) 108 (90 Base) MCG/ACT inhaler [id number] Yes Inhale 2 puffs into the lungs every 8 (eight) hours as needed for wheezing or shortness of breath. [name], MD Taking Active Self  aspirin EC 81 MG tablet [id number] Yes Take 1 tablet (81 mg total) by mouth daily. Fritzi Mandes, MD Taking Active   atorvastatin (LIPITOR) 40 MG tablet [id number] Yes Take 1 tablet (40 mg total) by mouth daily. Rise Mu, PA-C Taking Active   benzonatate (TESSALON) 200 MG capsule [id number] Yes Take 1 capsule (200 mg total) by mouth 2 (two) times daily as needed for cough. [name], MD Taking Active   buPROPion [facility]) 75 MG tablet [id number] Yes TAKE 2 TABLETS BY MOUTH EVERY MORNING AND  1 TABLET IN THE AFTERNOON [name], MD Taking Active   butalbital-acetaminophen-caffeine (FIORICET) [phone] MG tablet [id number] Yes TAKE 1 TABLET BY MOUTH EVERY 6 HOURS AS NEEDED [name], MD Taking Active   citalopram (CELEXA) 10 MG tablet [id number] Yes Take 1 tablet (10 mg total) by mouth daily. [name], MD Taking Active   clopidogrel (PLAVIX) 75 MG tablet [id number] Yes Take 1 tablet (75 mg total) by mouth daily. Rise Mu, PA-C Taking Active   clotrimazole-betamethasone  Donalynn Furlong) cream [id number] Yes Apply 1 application topically 2 (two) times daily.  Patient taking differently: Apply 1 application topically 2 (two) times daily as needed (rash).   Gae Dry, MD Taking Active Self  Continuous Blood Gluc Receiver (FREESTYLE LIBRE 2 READER) DEVI [id number] Yes Use to check sugar at least TID [name], MD Taking Active   Continuous Blood Gluc Sensor (FREESTYLE LIBRE [place]) [place] [id number] Yes Use to check sugar at least 3 times daily [name], MD Taking Active   cyanocobalamin (,VITAMIN B-12,) 1000 MCG/ML injection [id number] Yes Inject 1 ml (1000 mcg ) IM weekly x 4,  Then monthly thereafter  Patient taking differently: Inject 1,000 mcg into the muscle every 30 (thirty) days. On the 12th day   [name], MD Taking Active Self  dapagliflozin propanediol (FARXIGA) 10 MG TABS tablet [id number] Yes Take 1 tablet (10 mg total) by mouth daily before breakfast. [name], MD Taking Active   diazepam (VALIUM) 5 MG tablet [id number] Yes Take 1 tablet (5 mg total) by mouth at bedtime as needed for anxiety. [name], MD Taking Active   dicyclomine (BENTYL) 10 MG capsule [id number] Yes Take 1 capsule (10 mg total) by mouth 4 (four) times daily -  before meals and at bedtime. [name], MD Taking Active Self           Med Note Gillis Santa Dec 31, 2019  2:50 AM) Pt takes differently: take 1 capsule 4 times a day as needed.  diphenhydrAMINE (BENADRYL) 25 MG tablet [id number] Yes Take 50 mg by mouth daily as needed for allergies.  [provider] Taking Active Self  diphenoxylate-atropine (LOMOTIL) 2.5-0.025 MG tablet [id number] Yes Take 1 tablet by mouth 4 (four) times daily as needed for diarrhea or loose stools. [name], MD Taking Active   Eszopiclone 3 MG TABS [id number] Yes Take 1 tablet (3 mg total) by mouth at bedtime. [name], MD Taking Active   hyoscyamine (ANASPAZ) 0.125 MG TBDP disintergrating  tablet [id number] Yes DISSOLVE UNDER TONGUE 15 MINUTES BEFORE MEALS [name], MD Taking Active   insulin aspart (NOVOLOG FLEXPEN) 100 UNIT/ML FlexPen [id number] Yes Inject 8 units up to 3 times daily before meals [name], MD Taking Active   Insulin Glargine (LANTUS SOLOSTAR) 100 UNIT/ML Solostar Pen [id number] Yes Inject 11 Units into the skin daily.  Patient taking differently: Inject 10-15 Units into the skin daily.   [name], MD Taking Active            Med Note ([place] May 24, 2020  3:06 PM) 8 units  Insulin Pen Needle (DROPLET PEN NEEDLES) 31G X 5 MM MISC [id number] Yes USE ONE NEEDLE SUBCUTANEOUSLY AS DIRECTED (REMOVE AND DISCARD NEEDLE IN SHARPS CONTAINER IMMEDIATELY AFTER USE) [name], MD Taking Active   Insulin Syringe-Needle U-100 (INSULIN SYRINGE 1CC/30GX5/16") 30G X 5/16" 1  ML MISC [id number] Yes 1 Stick by Does not apply route 3 (three) times daily as needed. DX.E11.65 [name], MD Taking Active   ipratropium (ATROVENT) 0.06 % nasal spray [id number] Yes Place 2 sprays into both nostrils 4 (four) times daily. [name], MD Taking Active Self           Med Note Gillis Santa Dec 31, 2019  2:55 AM) Pt takes differently: Place 2 sprays into each nostril 4 times daily as needed.  levETIRAcetam (KEPPRA) 750 MG tablet [id number] Yes Take 1 tablet (750 mg total) by mouth at bedtime. Kathrynn Ducking, MD Taking Active Self           Med Note Gillis Santa Dec 31, 2019  2:58 AM) Pt takes differently: Take 1 tablet at bedtime as needed.  loperamide (IMODIUM) 2 MG capsule [id number] Yes Take by mouth. [provider] Taking Active   methocarbamol (ROBAXIN) 500 MG tablet [id number] Yes Take 500 mg by mouth at bedtime. [provider] Taking Active   neomycin-polymyxin-hydrocortisone (CORTISPORIN) OTIC solution [id number] Yes Place 3 drops into both ears 4 (four) times daily. For otitis externa [name], MD  Taking Active   nitroGLYCERIN (NITROSTAT) 0.4 MG SL tablet [id number] Yes Place 1 tablet (0.4 mg total) under the tongue every 5 (five) minutes as needed for chest pain. Fritzi Mandes, MD Taking Active   NONFORMULARY OR COMPOUNDED [place] [id number] Yes [facility]  Combination Pain Cream -  Baclofen 2%, Doxepin 5%, Gabapentin 6%, Topiramate 2%, Pentoxifylline 3% Apply 1-2 grams to affected area 3-4 times daily Qty. 120 gm 3 refills Gardiner Barefoot, DPM Taking Active Self           Med Note Rocky Crafts   Thu Dec 31, 2019  3:01 AM) Pt takes differently: Apply 3-4 times daily as needed.  nystatin (MYCOSTATIN/NYSTOP) powder [id number] Yes Apply 1 g topically 4 (four) times daily as needed (rash).  [provider] Taking Active Self  ondansetron (ZOFRAN ODT) 4 MG disintegrating tablet [id number] Yes Take 1 tablet (4 mg total) by mouth every 8 (eight) hours as needed for nausea or vomiting. [name], MD Taking Active Self  OVER THE COUNTER MEDICATION [id number] Yes Apply 1 application topically daily as needed (pain). [place] Gell [provider] Taking Active Self  pregabalin (LYRICA) 100 MG capsule [id number] Yes Take 100 mg by mouth at bedtime as needed (leg cramps).  [provider] Taking Active Self           Med Note Caryn Section, KYLE A   Fri Jun 05, 2019  9:41 AM)    traMADol (ULTRAM) 50 MG tablet [id number] Yes Take 0.5 tablets (25 mg total) by mouth every 12 (twelve) hours as needed for severe pain. Elmarie Shiley, MD Taking Active Self           Med Note Rocky Crafts   Thu Dec 31, 2019  3:03 AM) Pt takes differently: Take 1 tablet (50 mg) every 12 hours as needed for severe pain.  umeclidinium-vilanterol (ANORO ELLIPTA) 62.5-25 MCG/INH AEPB [id number] Yes Inhale 1 puff into the lungs daily. [name], MD Taking Active   Med List Note Jaymes Graff 07/22/17 1307): CPAP          Patient Active Problem List   Diagnosis Date Noted  . Iron  deficiency 06/28/2020  . Skin lesion of chest wall 06/10/2020  . Elevated  alkaline phosphatase level 06/10/2020  . Coagulation defect ([place]) 06/06/2020  . Chronic heart failure with preserved ejection fraction (HFpEF) ([place]) 01/14/2020  . Hyperlipidemia LDL goal <70 01/14/2020  . Coronary artery disease involving native coronary artery of native heart without angina pectoris 01/13/2020  . S/P cardiac catheterization 01/13/2020  . Angina at rest [facility]) 12/31/2019  . Dysphagia 11/30/2019  . Gastritis 08/07/2019  . Lab test negative for COVID-19 virus 08/07/2019  . S/P panniculectomy 06/16/2019  . Major depressive disorder with current active episode 05/30/2019  . Exposure to COVID-19 virus 12/25/2018  . Functional diarrhea 10/22/2018  . Bilateral cataracts 06/26/2018  . Status post bariatric surgery 02/05/2018  . GAD (generalized anxiety disorder) 02/01/2018  . Rectocele 01/24/2018  . Vaginal prolapse 01/04/2018  . Hepatic steatosis 01/04/2018  . Microalbuminuria due to type 2 diabetes mellitus ([place]) 11/21/2017  . Hospital discharge follow-up 09/22/2017  . Hypocalcemia 09/22/2017  . Hypotension 09/22/2017  . Fecal incontinence 09/22/2017  . B12 deficiency 09/22/2017  . Rotator cuff arthropathy, right 08/13/2017  . Primary localized osteoarthrosis of shoulder 08/13/2017  . Encounter for preoperative examination for general surgical procedure 08/03/2017  . 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 Candidiasis, intertrigo 08/03/2017  . 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 Edema 12/13/2015  . Hypersomnia, persistent 06/18/2015  . Mechanical breakdown of implanted electronic neurostimulator of peripheral nerve ([place]) 10/16/2014  . Obesity hypoventilation syndrome ([place]) 08/13/2014  . Frequent falls 06/23/2014  . Chronic cough 05/05/2014  . Adenoma of left adrenal gland 03/24/2014  . Vitamin D deficiency 01/06/2014  . Weight  gain following gastric bypass surgery 12/03/2013  . Morbid obesity ([place]) 08/25/2013  . Diaphragmatic hernia 04/06/2013  . Diabetic polyneuropathy ([place]) 03/25/2013  . Abnormality of gait 03/25/2013  . Multiple pulmonary nodules 03/20/2013  . Dyspnea on exertion 03/20/2013  . Anemia 08/06/2012  . Iron deficiency anemia 08/06/2012  . Functional disorder of bladder 07/15/2012  . Incomplete bladder emptying 07/15/2012  . 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 Urge incontinence 07/15/2012  . Increased frequency of urination 07/15/2012  . OSA on CPAP 03/02/2012  . Insomnia secondary to anxiety 02/29/2012  . Sciatica 09/05/2011  . Cervical stenosis of spinal canal 06/14/2011  . Restless legs syndrome 03/13/2011  . Degenerative disk disease 03/13/2011  . Essential hypertension 03/12/2011  . Uncontrolled type 2 diabetes with neuropathy ([place]) 03/12/2011  . Hearing loss, right 03/12/2011    Conditions to be addressed/monitored per PCP order: CAD, HTN, HLD and DMII  Patient Care Plan: Medication Management    Problem Identified: Diabetes, CHF     Long-Range Goal: Disease Progression Prevention   This Visit's Progress: On track  Recent Progress: On track  Priority: High  Note:   Current Barriers:  . Unable to achieve control of diabetes   Pharmacist Clinical Goal(s):  [name] Over the next 90 days, patient will achieve control of diabetes as evidenced by improvement in A1c through collaboration with PharmD and provider  Interventions: . Inter-disciplinary care team collaboration (see longitudinal plan of care) . Comprehensive medication review performed; medication list updated in electronic medical record  Diabetes: . Uncontrolled; current treatment: Farxiga 10 mg daily, Lantus 10 units daily, Novolog 8 units up to TID with meals  o Hx intolerance to metformin . Patient calls to report that [place] noted they do not supply [place] 2, and that they had not been supplying [place]  14 day. Patient is not sure where order  for [place] 2 needs to be sent. Will place order to [place] for benefits investigation.  .   Hyperlipidemia: . Controlled; current treatment: atorvastatin 40 mg daily  . Recommended to continue current regimen at this time  Depression/Anxiety with Insomnia: . Moderately well managed. Notes that sleep has been worse lately d/t "leg cramps". Current treatment: citalopram 10 mg daily, bupropion 150 mg QAM, 75 mg QPM, diazepam 5 mg QPM, Lunesta 3 mg QPM . Recommended to continue current regimen at this time  Chronic Diarrhea, S/p Gastric Bypass, Esophageal "growth" . Moderately well managed, given patient history. Current regimen: dicyclomine 10 mg QID PRN, hyoscyamine 10 mg QID PRN  Nocturnal "leg cramps" . More exacerbated lately, patient unsure why. Current regimen: pregabalin 100 mg QPM PRN, levetiracetam 500 mg QPM PRN . Recommended to continue current regimen at this time  COPD:  . Well controlled; Current regimen: Anoro 62.5/25 mcg daily, albuterol HFA PRN  . Continue current regimen at this time  Patient Goals/Self-Care Activities . Over the next 90 days, patient will:  - take medications as prescribed check blood glucose TID using CGM, document, and provide at future appointments  Follow Up Plan: Face to Face appointment with care management team member scheduled for:  ~ 3 weeks      Medication Assistance: None required. Patient affirms current coverage meets needs.   Plan: Telephone follow up appointment with care management team member scheduled for: ~ 3 weeks as previously scheduled  Catie Darnelle Maffucci, PharmD, [place], [place] Clinical Pharmacist [employer] at [employer] [phone]

## 2020-07-05 NOTE — Patient Instructions (Signed)
Visit Information  Patient Care Plan: Medication Management    Problem Identified: Diabetes, CHF     Long-Range Goal: Disease Progression Prevention   This Visit's Progress: On track  Recent Progress: On track  Priority: High  Note:   Current Barriers:  . Unable to achieve control of diabetes   Pharmacist Clinical Goal(s):  [name] Over the next 90 days, patient will achieve control of diabetes as evidenced by improvement in A1c through collaboration with PharmD and provider  Interventions: . Inter-disciplinary care team collaboration (see longitudinal plan of care) . Comprehensive medication review performed; medication list updated in electronic medical record  Diabetes: . Uncontrolled; current treatment: Farxiga 10 mg daily, Lantus 10 units daily, Novolog 8 units up to TID with meals  o Hx intolerance to metformin . Patient calls to report that [place] noted they do not supply [place] 2, and that they had not been supplying [place] 14 day. Patient is not sure where order for [facility] 2 needs to be sent. Will place order to [place] for benefits investigation.  .   Hyperlipidemia: . Controlled; current treatment: atorvastatin 40 mg daily  . Recommended to continue current regimen at this time  Depression/Anxiety with Insomnia: . Moderately well managed. Notes that sleep has been worse lately d/t "leg cramps". Current treatment: citalopram 10 mg daily, bupropion 150 mg QAM, 75 mg QPM, diazepam 5 mg QPM, Lunesta 3 mg QPM . Recommended to continue current regimen at this time  Chronic Diarrhea, S/p Gastric Bypass, Esophageal "growth" . Moderately well managed, given patient history. Current regimen: dicyclomine 10 mg QID PRN, hyoscyamine 10 mg QID PRN  Nocturnal "leg cramps" . More exacerbated lately, patient unsure why. Current regimen: pregabalin 100 mg QPM PRN, levetiracetam 500 mg QPM PRN . Recommended to continue current regimen at this time  COPD:  . Well controlled; Current  regimen: Anoro 62.5/25 mcg daily, albuterol HFA PRN  . Continue current regimen at this time  Patient Goals/Self-Care Activities . Over the next 90 days, patient will:  - take medications as prescribed check blood glucose TID using CGM, document, and provide at future appointments  Follow Up Plan: Face to Face appointment with care management team member scheduled for:  ~ 3 weeks       The patient verbalized understanding of instructions, educational materials, and care plan provided today and declined offer to receive copy of patient instructions, educational materials, and care plan.   Plan: Telephone follow up appointment with care management team member scheduled for: ~ 3 weeks as previously scheduled  Catie Darnelle Maffucci, PharmD, [place], [place] Clinical Pharmacist [employer] at [employer] [phone]

## 2020-07-06 ENCOUNTER — Inpatient Hospital Stay: Payer: Medicare Other

## 2020-07-06 VITALS — BP 125/58 | HR 77 | Temp 97.0°F

## 2020-07-06 DIAGNOSIS — R5383 Other fatigue: Secondary | ICD-10-CM | POA: Diagnosis not present

## 2020-07-06 DIAGNOSIS — R531 Weakness: Secondary | ICD-10-CM | POA: Diagnosis not present

## 2020-07-06 DIAGNOSIS — Z9884 Bariatric surgery status: Secondary | ICD-10-CM | POA: Diagnosis not present

## 2020-07-06 DIAGNOSIS — D509 Iron deficiency anemia, unspecified: Secondary | ICD-10-CM

## 2020-07-06 DIAGNOSIS — R1909 Other intra-abdominal and pelvic swelling, mass and lump: Secondary | ICD-10-CM | POA: Diagnosis not present

## 2020-07-06 DIAGNOSIS — Z79899 Other long term (current) drug therapy: Secondary | ICD-10-CM | POA: Diagnosis not present

## 2020-07-06 DIAGNOSIS — E611 Iron deficiency: Secondary | ICD-10-CM

## 2020-07-06 MED ORDER — SODIUM CHLORIDE 0.9 % IV SOLN
510.0000 mg | Freq: Once | INTRAVENOUS | Status: AC
  Administered 2020-07-06: 510 mg via INTRAVENOUS
  Filled 2020-07-06: qty 510

## 2020-07-06 MED ORDER — SODIUM CHLORIDE 0.9 % IV SOLN
Freq: Once | INTRAVENOUS | Status: AC
  Filled 2020-07-06: qty 250

## 2020-07-06 NOTE — Progress Notes (Signed)
Pt tolerated Feraheme infusion well with no signs of complications. VSS. Pt stable for discharge. Pt escorted out via wheelchair to be driven home by husband.   Edi Gorniak [employer]

## 2020-07-13 ENCOUNTER — Other Ambulatory Visit: Payer: Self-pay

## 2020-07-13 ENCOUNTER — Inpatient Hospital Stay: Payer: Medicare Other

## 2020-07-13 VITALS — BP 125/72 | HR 73 | Temp 98.3°F | Resp 19

## 2020-07-13 DIAGNOSIS — D509 Iron deficiency anemia, unspecified: Secondary | ICD-10-CM

## 2020-07-13 DIAGNOSIS — Z79899 Other long term (current) drug therapy: Secondary | ICD-10-CM | POA: Diagnosis not present

## 2020-07-13 DIAGNOSIS — E611 Iron deficiency: Secondary | ICD-10-CM

## 2020-07-13 DIAGNOSIS — R5383 Other fatigue: Secondary | ICD-10-CM | POA: Diagnosis not present

## 2020-07-13 DIAGNOSIS — Z9884 Bariatric surgery status: Secondary | ICD-10-CM | POA: Diagnosis not present

## 2020-07-13 DIAGNOSIS — R1909 Other intra-abdominal and pelvic swelling, mass and lump: Secondary | ICD-10-CM | POA: Diagnosis not present

## 2020-07-13 DIAGNOSIS — R531 Weakness: Secondary | ICD-10-CM | POA: Diagnosis not present

## 2020-07-13 MED ORDER — SODIUM CHLORIDE 0.9 % IV SOLN
510.0000 mg | Freq: Once | INTRAVENOUS | Status: AC
  Administered 2020-07-13: 510 mg via INTRAVENOUS
  Filled 2020-07-13: qty 510

## 2020-07-13 MED ORDER — SODIUM CHLORIDE 0.9 % IV SOLN
Freq: Once | INTRAVENOUS | Status: AC
  Filled 2020-07-13: qty 250

## 2020-07-13 NOTE — Progress Notes (Signed)
Pt tolerated infusion well with no signs of complications. VSS. Pt stable for discharge. Pt escorted out via wheelchair to be driven home by husband.   Denise Sharp [employer]

## 2020-07-14 NOTE — Chronic Care Management (AMB) (Signed)
ADDENDUM: Patient is injecting Novolog based upon her blood sugars. She is injecting 1-8 units up to TID with meals.   Catie Darnelle Maffucci, PharmD, [place], [place] Clinical Pharmacist [employer] at [place]

## 2020-07-17 ENCOUNTER — Encounter: Payer: Self-pay | Admitting: Emergency Medicine

## 2020-07-17 ENCOUNTER — Emergency Department: Payer: Medicare Other

## 2020-07-17 ENCOUNTER — Emergency Department
Admission: EM | Admit: 2020-07-17 | Discharge: 2020-07-17 | Disposition: A | Discharge status: Home / Self Care | Payer: Medicare Other | Attending: Emergency Medicine | Admitting: Emergency Medicine

## 2020-07-17 ENCOUNTER — Other Ambulatory Visit: Payer: Self-pay

## 2020-07-17 DIAGNOSIS — W19XXXA Unspecified fall, initial encounter: Secondary | ICD-10-CM | POA: Diagnosis not present

## 2020-07-17 DIAGNOSIS — R413 Other amnesia: Secondary | ICD-10-CM | POA: Insufficient documentation

## 2020-07-17 DIAGNOSIS — R410 Disorientation, unspecified: Secondary | ICD-10-CM | POA: Diagnosis not present

## 2020-07-17 DIAGNOSIS — Z7984 Long term (current) use of oral hypoglycemic drugs: Secondary | ICD-10-CM | POA: Diagnosis not present

## 2020-07-17 DIAGNOSIS — Z794 Long term (current) use of insulin: Secondary | ICD-10-CM | POA: Insufficient documentation

## 2020-07-17 DIAGNOSIS — Z96653 Presence of artificial knee joint, bilateral: Secondary | ICD-10-CM | POA: Diagnosis not present

## 2020-07-17 DIAGNOSIS — E1140 Type 2 diabetes mellitus with diabetic neuropathy, unspecified: Secondary | ICD-10-CM | POA: Diagnosis not present

## 2020-07-17 DIAGNOSIS — E1142 Type 2 diabetes mellitus with diabetic polyneuropathy: Secondary | ICD-10-CM | POA: Diagnosis not present

## 2020-07-17 DIAGNOSIS — Z7982 Long term (current) use of aspirin: Secondary | ICD-10-CM | POA: Insufficient documentation

## 2020-07-17 DIAGNOSIS — I2510 Atherosclerotic heart disease of native coronary artery without angina pectoris: Secondary | ICD-10-CM | POA: Insufficient documentation

## 2020-07-17 DIAGNOSIS — I509 Heart failure, unspecified: Secondary | ICD-10-CM | POA: Insufficient documentation

## 2020-07-17 DIAGNOSIS — I110 Hypertensive heart disease with heart failure: Secondary | ICD-10-CM | POA: Insufficient documentation

## 2020-07-17 DIAGNOSIS — R4182 Altered mental status, unspecified: Secondary | ICD-10-CM | POA: Diagnosis not present

## 2020-07-17 DIAGNOSIS — Z7902 Long term (current) use of antithrombotics/antiplatelets: Secondary | ICD-10-CM | POA: Insufficient documentation

## 2020-07-17 DIAGNOSIS — R0902 Hypoxemia: Secondary | ICD-10-CM | POA: Diagnosis not present

## 2020-07-17 LAB — CBC
HCT: 38.6 % (ref 36.0–46.0)
Hemoglobin: 11.4 g/dL — ABNORMAL LOW (ref 12.0–15.0)
MCH: 24.8 pg — ABNORMAL LOW (ref 26.0–34.0)
MCHC: 29.5 g/dL — ABNORMAL LOW (ref 30.0–36.0)
MCV: 83.9 fL (ref 80.0–100.0)
Platelets: 354 10*3/uL (ref 150–400)
RBC: 4.6 MIL/uL (ref 3.87–5.11)
RDW: 24.1 % — ABNORMAL HIGH (ref 11.5–15.5)
WBC: 10.5 10*3/uL (ref 4.0–10.5)
nRBC: 0 % (ref 0.0–0.2)

## 2020-07-17 LAB — COMPREHENSIVE METABOLIC PANEL
ALT: 12 U/L (ref 0–44)
AST: 18 U/L (ref 15–41)
Albumin: 3.5 g/dL (ref 3.5–5.0)
Alkaline Phosphatase: 129 U/L — ABNORMAL HIGH (ref 38–126)
Anion gap: 14 (ref 5–15)
BUN: 14 mg/dL (ref 8–23)
CO2: 20 mmol/L — ABNORMAL LOW (ref 22–32)
Calcium: 9.1 mg/dL (ref 8.9–10.3)
Chloride: 106 mmol/L (ref 98–111)
Creatinine, Ser: 1.03 mg/dL — ABNORMAL HIGH (ref 0.44–1.00)
GFR, Estimated: 57 mL/min — ABNORMAL LOW (ref >60)
Glucose, Bld: 117 mg/dL — ABNORMAL HIGH (ref 70–99)
Potassium: 3.7 mmol/L (ref 3.5–5.1)
Sodium: 140 mmol/L (ref 135–145)
Total Bilirubin: 0.5 mg/dL (ref 0.3–1.2)
Total Protein: 7.6 g/dL (ref 6.5–8.1)

## 2020-07-17 LAB — URINALYSIS, COMPLETE (UACMP) WITH MICROSCOPIC
Bilirubin Urine: NEGATIVE
Glucose, UA: >=500 mg/dL — AB
Ketones, ur: NEGATIVE mg/dL
Leukocytes,Ua: NEGATIVE
Nitrite: NEGATIVE
Protein, ur: NEGATIVE mg/dL
Specific Gravity, Urine: 1.023 (ref 1.005–1.030)
pH: 5 (ref 5.0–8.0)

## 2020-07-17 LAB — CBG MONITORING, ED: Glucose-Capillary: 100 mg/dL — ABNORMAL HIGH (ref 70–99)

## 2020-07-17 LAB — TSH: TSH: 3.695 u[IU]/mL (ref 0.350–4.500)

## 2020-07-17 NOTE — ED Notes (Signed)
[phone] Daughter Alyse Low requesting to be called with updates

## 2020-07-17 NOTE — ED Provider Notes (Signed)
[facility] Emergency Department Provider Note  ____________________________________________   Event Date/Time   First MD Initiated Contact with Patient 07/17/20 2034     (approximate)  I have reviewed the triage vital signs and the nursing notes.   HISTORY  Chief Complaint Altered Mental Status   HPI JAMAE TISON is a 75 y.o. female with a past medical history of DJD, IBS, obesity, DM, depression, CHF, OSA, peripheral neuropathy and arthritis who presents accompanied by her daughter for assessment with concerns for some difficulty with her memory earlier this morning.  Patient states she is not sure if this is related to a fall she had in September but notes she has had chronic headaches since a fall in September.  She states she has not fallen since then.  She states she talk to her PCP who advised her to come to the emergency room to make sure she does not have any bruising or bleeding.  She states she does not have any acute chest pain, abdominal pain, urinary symptoms and has not had any recent vomiting, diarrhea, rash or acute extremity pain.  Per her daughter who is at bedside now appears much better than she did earlier today.  Patient states her sugars are in the 150s this morning that she just remembers feeling like her days and nights are mixed up and not knowing which were medicines to take but that she feels slightly better now.  No clear alleviating aggravating precipitating events.          Past Medical History:  Diagnosis Date  . Abnormality of gait 03/25/2013  . Adrenal mass, left ([place])   . Anemia    iron deficient post  2 unit txfsn 2009, normal endo/colonoscopy by [facility]  . Arthritis   . Atypical chest pain    a. 04/1986 Cath ([facility]): nl cors, EF 65%; b. 07/2012 Cath [facility]): LM nl, LAD/LCX/RCA w/ min irregs; c. 07/2016 MV [facility]): "Equivocal" w/ small inf wall rev defect and hyperdynamic LV contraction, EF 87%; d. 08/2016 Cardiac CT Ca2+ score  [facility]): Ca2+ score 1548; e. 05/2019 MV: No ischemia. Diaph atten. EF 75%.  . Cervical spinal stenosis 1994   due to trauma to back ([employer] accident), has intermittent paralysis and parasthesias  . Cervicogenic headache 03/23/2014  . Depression   . Diastolic dysfunction    a. 12/2019 Echo: EF 65-70%, Gr2 DD. No significant valvular dzs.  . Dizziness    chronic dizziness  . DJD (degenerative joint disease)    a. Chronic R shoulder pain pending R shoulder replacement 07/2017.  [name] Esophageal stenosis March 2011   with transietn outlet obstruction by food, cleared by EGD   . Family history of adverse reaction to anesthesia    daughter PONV  . Gastric bypass status for obesity   . Headache(784.0)   . Hypertension   . IBS (irritable bowel syndrome)   . Left bundle branch block (LBBB)    a. Intermittently present - likely rate related. - patient denies  . Obesity   . Obstructive sleep apnea    using CPAP  . 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 Restless leg syndrome   . Rotator cuff arthropathy, right 08/13/2017  . Syncope and collapse 03/12/2014  . Type II diabetes mellitus [facility])     Patient Active Problem List   Diagnosis Date Noted  . Iron deficiency 06/28/2020  . Skin lesion of chest wall 06/10/2020  . Elevated alkaline phosphatase level 06/10/2020  . Coagulation  defect ([place]) 06/06/2020  . Chronic heart failure with preserved ejection fraction (HFpEF) ([place]) 01/14/2020  . Hyperlipidemia LDL goal <70 01/14/2020  . Coronary artery disease involving native coronary artery of native heart without angina pectoris 01/13/2020  . S/P cardiac catheterization 01/13/2020  . Angina at rest [facility]) 12/31/2019  . Dysphagia 11/30/2019  . Gastritis 08/07/2019  . Lab test negative for COVID-19 virus 08/07/2019  . S/P panniculectomy 06/16/2019  . Major depressive disorder with current active episode 05/30/2019  . Exposure to COVID-19 virus 12/25/2018  . Functional diarrhea 10/22/2018  .  Bilateral cataracts 06/26/2018  . Status post bariatric surgery 02/05/2018  . GAD (generalized anxiety disorder) 02/01/2018  . Rectocele 01/24/2018  . Vaginal prolapse 01/04/2018  . Hepatic steatosis 01/04/2018  . Microalbuminuria due to type 2 diabetes mellitus ([place]) 11/21/2017  . Hospital discharge follow-up 09/22/2017  . Hypocalcemia 09/22/2017  . Hypotension 09/22/2017  . Fecal incontinence 09/22/2017  . B12 deficiency 09/22/2017  . Rotator cuff arthropathy, right 08/13/2017  . Primary localized osteoarthrosis of shoulder 08/13/2017  . Encounter for preoperative examination for general surgical procedure 08/03/2017  . 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 Candidiasis, intertrigo 08/03/2017  . 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 Edema 12/13/2015  . Hypersomnia, persistent 06/18/2015  . Mechanical breakdown of implanted electronic neurostimulator of peripheral nerve ([place]) 10/16/2014  . Obesity hypoventilation syndrome ([place]) 08/13/2014  . Frequent falls 06/23/2014  . Chronic cough 05/05/2014  . Adenoma of left adrenal gland 03/24/2014  . Vitamin D deficiency 01/06/2014  . Weight gain following gastric bypass surgery 12/03/2013  . Morbid obesity ([place]) 08/25/2013  . Diaphragmatic hernia 04/06/2013  . Diabetic polyneuropathy ([place]) 03/25/2013  . Abnormality of gait 03/25/2013  . Multiple pulmonary nodules 03/20/2013  . Dyspnea on exertion 03/20/2013  . Anemia 08/06/2012  . Iron deficiency anemia 08/06/2012  . Functional disorder of bladder 07/15/2012  . Incomplete bladder emptying 07/15/2012  . 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[place]) 07/15/2012  . Urge incontinence 07/15/2012  . Increased frequency of urination 07/15/2012  . OSA on CPAP 03/02/2012  . Insomnia secondary to anxiety 02/29/2012  . Sciatica 09/05/2011  . Cervical stenosis of spinal canal 06/14/2011  . Restless legs syndrome 03/13/2011  . Degenerative  disk disease 03/13/2011  . Essential hypertension 03/12/2011  . Uncontrolled type 2 diabetes with neuropathy ([place]) 03/12/2011  . Hearing loss, right 03/12/2011    Past Surgical History:  Procedure Laterality Date  . ABDOMINAL HYSTERECTOMY    . APPENDECTOMY    . COLONOSCOPY    . CORONARY STENT INTERVENTION N/A 01/04/2020   Procedure: CORONARY STENT INTERVENTION;  Surgeon: [name], MD;  Location: [facility];  Service: Cardiovascular;  Laterality: N/A;  LAD   . DIAPHRAGMATIC HERNIA REPAIR  2015  . ESOPHAGEAL DILATION     multiple  . ESOPHAGOGASTRODUODENOSCOPY    . [place]  . GALLBLADDER SURGERY  resection  . GASTRIC BYPASS    . GASTRIC BYPASS  2000, 2005   Dr. Debroah Loop  . [facility] IMPLANT PLACEMENT  April 2013   [place]  . JOINT REPLACEMENT  2007   bilateral knee. [name],  [name]  . LEFT HEART CATH AND CORS/GRAFTS ANGIOGRAPHY N/A 12/31/2019   Procedure: LEFT HEART CATH AND CORS/GRAFTS ANGIOGRAPHY;  Surgeon: [name], MD;  Location: [facility];  Service: Cardiovascular;  Laterality: N/A;  . PANNICULECTOMY  06/16/2019  . PANNICULECTOMY N/A 06/16/2019   Procedure: PANNICULECTOMY;  Surgeon: [name], MD;  Location: [facility];  Service: Plastics;  Laterality: N/A;  3 hours, please  . REVERSE SHOULDER ARTHROPLASTY Right 08/13/2017   Procedure: REVERSE RIGHT SHOULDER ARTHROPLASTY;  Surgeon: [name], MD;  Location: [facility];  Service: Orthopedics;  Laterality: Right;  . ROTATOR CUFF REPAIR     right  . [place]  . TOTAL ABDOMINAL HYSTERECTOMY W/ BILATERAL SALPINGOOPHORECTOMY  1974  . UMBILICAL HERNIA REPAIR  Aug 11, 2015    Prior to Admission medications   Medication Sig Start Date End Date Taking? Authorizing Provider  albuterol (VENTOLIN HFA) 108 (90 Base) MCG/ACT inhaler Inhale 2 puffs into the lungs every 8 (eight) hours as needed for wheezing or shortness of breath. 08/07/19   [name], MD   aspirin EC 81 MG tablet Take 1 tablet (81 mg total) by mouth daily. 01/05/20   Fritzi Mandes, MD  atorvastatin (LIPITOR) 40 MG tablet Take 1 tablet (40 mg total) by mouth daily. 03/07/20   Dunn, Areta Haber, PA-C  benzonatate (TESSALON) 200 MG capsule Take 1 capsule (200 mg total) by mouth 2 (two) times daily as needed for cough. 01/13/20   [name], MD  buPROPion (WELLBUTRIN) 75 MG tablet TAKE 2 TABLETS BY MOUTH EVERY MORNING AND 1 TABLET IN THE AFTERNOON 02/19/20   [name], MD  butalbital-acetaminophen-caffeine (FIORICET) [phone] MG tablet TAKE 1 TABLET BY MOUTH EVERY 6 HOURS AS NEEDED 05/25/20   [name], MD  citalopram (CELEXA) 10 MG tablet Take 1 tablet (10 mg total) by mouth daily. 04/21/20   [name], MD  clopidogrel (PLAVIX) 75 MG tablet Take 1 tablet (75 mg total) by mouth daily. 03/07/20   Dunn, Areta Haber, PA-C  clotrimazole-betamethasone (LOTRISONE) cream Apply 1 application topically 2 (two) times daily. Patient taking differently: Apply 1 application topically 2 (two) times daily as needed (rash). 01/24/18   Gae Dry, MD  Continuous Blood Gluc Receiver (FREESTYLE LIBRE 2 READER) DEVI Use to check sugar at least TID 06/23/20   [name], MD  Continuous Blood Gluc Sensor (FREESTYLE LIBRE 14 DAY SENSOR) MISC Use to check sugar at least 3 times daily 06/23/20   [name], MD  cyanocobalamin (,VITAMIN B-12,) 1000 MCG/ML injection Inject 1 ml (1000 mcg ) IM weekly x 4,  Then monthly thereafter Patient taking differently: Inject 1,000 mcg into the muscle every 30 (thirty) days. On the 12th day 09/25/18   [name], MD  dapagliflozin propanediol (FARXIGA) 10 MG TABS tablet Take 1 tablet (10 mg total) by mouth daily before breakfast. 05/24/20   [name], MD  diazepam (VALIUM) 5 MG tablet Take 1 tablet (5 mg total) by mouth at bedtime as needed for anxiety. 05/27/20   [name], MD  dicyclomine (BENTYL) 10 MG capsule Take 1 capsule (10 mg total) by  mouth 4 (four) times daily -  before meals and at bedtime. 07/29/19   [name], MD  diphenhydrAMINE (BENADRYL) 25 MG tablet Take 50 mg by mouth daily as needed for allergies.     [provider]  diphenoxylate-atropine (LOMOTIL) 2.5-0.025 MG tablet Take 1 tablet by mouth 4 (four) times daily as needed for diarrhea or loose stools. 03/24/20   [name], MD  Eszopiclone 3 MG TABS Take 1 tablet (3 mg total) by mouth at bedtime. 05/27/20   [name], MD  hyoscyamine (ANASPAZ) 0.125 MG TBDP disintergrating  tablet DISSOLVE UNDER TONGUE 15 MINUTES BEFORE MEALS 04/21/20   [name], MD  insulin aspart (NOVOLOG FLEXPEN) 100 UNIT/ML FlexPen Inject 8 units up to 3 times daily before meals 06/23/20   [name], MD  Insulin Glargine (LANTUS SOLOSTAR) 100 UNIT/ML Solostar Pen Inject 11 Units into the skin daily. Patient taking differently: Inject 10-15 Units into the skin daily. 03/31/19   [name], MD  Insulin Pen Needle (DROPLET PEN NEEDLES) 31G X 5 MM MISC USE ONE NEEDLE SUBCUTANEOUSLY AS DIRECTED (REMOVE AND DISCARD NEEDLE IN SHARPS CONTAINER IMMEDIATELY AFTER USE) 06/13/20   [name], MD  Insulin Syringe-Needle U-100 (INSULIN SYRINGE 1CC/30GX5/16") 30G X 5/16" 1 ML MISC 1 Stick by Does not apply route 3 (three) times daily as needed. DX.E11.65 06/14/20   [name], MD  ipratropium (ATROVENT) 0.06 % nasal spray Place 2 sprays into both nostrils 4 (four) times daily. 08/21/19   [name], MD  levETIRAcetam (KEPPRA) 750 MG tablet Take 1 tablet (750 mg total) by mouth at bedtime. 08/08/18   Kathrynn Ducking, MD  loperamide (IMODIUM) 2 MG capsule Take by mouth.    [provider]  methocarbamol (ROBAXIN) 500 MG tablet Take 500 mg by mouth at bedtime. 04/08/20   [provider]  neomycin-polymyxin-hydrocortisone (CORTISPORIN) OTIC solution Place 3 drops into both ears 4 (four) times daily. For otitis externa 02/15/20   [name], MD   nitroGLYCERIN (NITROSTAT) 0.4 MG SL tablet Place 1 tablet (0.4 mg total) under the tongue every 5 (five) minutes as needed for chest pain. 01/05/20   Fritzi Mandes, MD  NONFORMULARY OR COMPOUNDED [place]  Combination Pain Cream -  Baclofen 2%, Doxepin 5%, Gabapentin 6%, Topiramate 2%, Pentoxifylline 3% Apply 1-2 grams to affected area 3-4 times daily Qty. 120 gm 3 refills 10/14/17   Gardiner Barefoot, DPM  nystatin (MYCOSTATIN/NYSTOP) powder Apply 1 g topically 4 (four) times daily as needed (rash).     [provider]  ondansetron (ZOFRAN ODT) 4 MG disintegrating tablet Take 1 tablet (4 mg total) by mouth every 8 (eight) hours as needed for nausea or vomiting. 08/07/19   [name], MD  OVER THE COUNTER MEDICATION Apply 1 application topically daily as needed (pain). [place]    [provider]  pregabalin (LYRICA) 100 MG capsule Take 100 mg by mouth at bedtime as needed (leg cramps).     [provider]  traMADol (ULTRAM) 50 MG tablet Take 0.5 tablets (25 mg total) by mouth every 12 (twelve) hours as needed for severe pain. 08/21/17   Regalado, Belkys A, MD  umeclidinium-vilanterol (ANORO ELLIPTA) 62.5-25 MCG/INH AEPB Inhale 1 puff into the lungs daily. 05/24/20   [name], MD    Allergies Demeclocycline, Erythromycin, Flagyl [metronidazole], Glucophage [metformin hcl], Tetracyclines & related, Diovan [valsartan], Sulfa antibiotics, and Xanax [alprazolam]  Family History  Problem Relation Age of Onset  . Heart disease Father   . Hypertension Father   . Prostate cancer Father   . Stroke Father   . Osteoporosis Father   . Stroke Mother   . Depression Mother   . Headache Mother   . Heart disease Mother   . Thyroid disease Mother   . Hypertension Mother   . Diabetes Daughter   . Heart disease Daughter   . Hypertension Daughter   . Hypertension Son     Social History Social History   Tobacco Use  . Smoking status: Never  Smoker  .  Smokeless tobacco: Never Used  Vaping Use  . Vaping Use: Never used  Substance Use Topics  . Alcohol use: No  . Drug use: No    Review of Systems  Review of Systems  Constitutional: Negative for chills and fever.  HENT: Negative for sore throat.   Eyes: Negative for pain.  Respiratory: Negative for cough and stridor.   Cardiovascular: Negative for chest pain.  Gastrointestinal: Negative for vomiting.  Genitourinary: Negative for dysuria.  Musculoskeletal: Positive for joint pain ( chronic in multiple joints). Negative for myalgias.  Skin: Negative for rash.  Neurological: Positive for headaches. Negative for seizures and loss of consciousness.  Endo/Heme/Allergies: Bruises/bleeds easily.  Psychiatric/Behavioral: Positive for memory loss. Negative for suicidal ideas.  All other systems reviewed and are negative.     ____________________________________________   PHYSICAL EXAM:  VITAL SIGNS: ED Triage Vitals  Enc Vitals Group     BP 07/17/20 1145 (!) 118/53     Pulse Rate 07/17/20 1145 71     Resp 07/17/20 1145 16     Temp 07/17/20 1145 98.8 F (37.1 C)     Temp Source 07/17/20 1145 Oral     SpO2 07/17/20 1145 96 %     Weight 07/17/20 1146 243 lb (110.2 kg)     Height 07/17/20 1146 5\' 4"  (1.626 m)     Head Circumference --      Peak Flow --      Pain Score 07/17/20 1145 3     Pain Loc --      Pain Edu? --      Excl. in [place]? --    Vitals:   07/17/20 1634 07/17/20 2108  BP: (!) 144/57 132/66  Pulse: 77 64  Resp: 16 16  Temp: 98.3 F (36.8 C)   SpO2: 100% 98%   Physical Exam Vitals and nursing note reviewed.  Constitutional:      General: She is not in acute distress.    Appearance: She is well-developed and well-nourished. She is obese.  HENT:     Head: Normocephalic and atraumatic.     Right Ear: External ear normal.     Left Ear: External ear normal.     Nose: Nose normal.     Mouth/Throat:     Mouth: Mucous membranes are moist.  Eyes:      Conjunctiva/sclera: Conjunctivae normal.  Cardiovascular:     Rate and Rhythm: Normal rate and regular rhythm.     Heart sounds: No murmur heard.   Pulmonary:     Effort: Pulmonary effort is normal. No respiratory distress.     Breath sounds: Normal breath sounds.  Abdominal:     Palpations: Abdomen is soft.     Tenderness: There is no abdominal tenderness.  Musculoskeletal:        General: No edema.     Cervical back: Neck supple.  Skin:    General: Skin is warm and dry.     Capillary Refill: Capillary refill takes less than 2 seconds.  Neurological:     Mental Status: She is alert and oriented to person, place, and time.  Psychiatric:        Mood and Affect: Mood and affect and mood normal.      Cranial nerves II through XII grossly intact.  No pronator drift.  No finger dysmetria.  Symmetric 5/5 strength of all extremities.  Sensation intact to light touch in all extremities.  Unremarkable unassisted gait. ____________________________________________  LABS (all labs ordered are listed, but only abnormal results are displayed)  Labs Reviewed  COMPREHENSIVE METABOLIC PANEL - Abnormal; Notable for the following components:      Result Value   CO2 20 (*)    Glucose, Bld 117 (*)    Creatinine, Ser 1.03 (*)    Alkaline Phosphatase 129 (*)    GFR, Estimated 57 (*)    All other components within normal limits  CBC - Abnormal; Notable for the following components:   Hemoglobin 11.4 (*)    MCH 24.8 (*)    MCHC 29.5 (*)    RDW 24.1 (*)    All other components within normal limits  URINALYSIS, COMPLETE (UACMP) WITH MICROSCOPIC - Abnormal; Notable for the following components:   Color, Urine YELLOW (*)    APPearance CLOUDY (*)    Glucose, UA >=500 (*)    Hgb urine dipstick SMALL (*)    Bacteria, UA MANY (*)    All other components within normal limits  CBG MONITORING, ED - Abnormal; Notable for the following components:   Glucose-Capillary 100 (*)    All other components  within normal limits  TSH  CBG MONITORING, ED   ____________________________________________  ____________________________________________  RADIOLOGY  ED MD interpretation: No intracranial hemorrhage, mass-effect, calvarial skull fracture or other acute intracranial process  Official radiology report(s): CT Head Wo Contrast  Result Date: 07/17/2020 CLINICAL DATA:  Mental status change EXAM: CT HEAD WITHOUT CONTRAST TECHNIQUE: Contiguous axial images were obtained from the base of the skull through the vertex without intravenous contrast. COMPARISON:  CT head 03/23/2020 FINDINGS: Brain: Mild atrophy without hydrocephalus. Patchy white matter hypodensity bilaterally stable. Negative for acute infarct, hemorrhage, mass. Vascular: Negative for hyperdense vessel Skull: Negative calvarium Sinuses/Orbits: Negative paranasal sinuses. Bilateral cataract extraction Other: None IMPRESSION: No acute abnormality. Stable atrophy and chronic white matter changes. Electronically Signed   By: Franchot Gallo M.D.   On: 07/17/2020 17:17    ____________________________________________   PROCEDURES  Procedure(s) performed (including Critical Care):  Procedures   ____________________________________________   INITIAL IMPRESSION / ASSESSMENT AND PLAN / ED COURSE      Patient presents accompanied by her daughter for assessment with concerns for some memory loss she experienced this morning not noting whether was day or night and mixing up her medications.  Patient has not had any subsequent falls since a fall several months ago although has had persistent headache since then.  She states she does not remember falling or hitting her head although she thinks she stubbed her toe.  She denies any acute chest pain, shortness of breath, nausea, or cough.  She does note she wears CPAP and sometimes wakes up at night feeling short of breath but currently feels okay.  Overall unclear etiology of patient's memory  difficulties earlier this morning.  No evidence on CT of any acute injury or other acute intracranial process.  No findings on exam to suggest CVA.  Patient is not hypoglycemic in emergency room and sounds like she checked her sugar and she was not hypoglycemic then.  There were no historical or exam findings to suggest other acute infectious process and UA is unremarkable for evidence of acute infection.  CBC is unremarkable for evidence of leukocytosis or acute anemia.  CMP shows no significant electrolyte or metabolic derangements.  Presentation is not consistent with hypertensive emergency, meningitis, significant metabolic derangement, toxic ingestion or other clear etiology for some difficulty with her memory this morning.  Given stable vital signs  with otherwise nonfocal exam and patient stating she feels better and her daughter stating she has been back to baseline otherwise reassuring CT head and work-up I believe she is safe for discharge with plan for outpatient PCP and neurology follow-up.  Discharged stable condition.  Return precautions advised and discussed.    ____________________________________________   FINAL CLINICAL IMPRESSION(S) / ED DIAGNOSES  Final diagnoses:  Memory problem    Medications - No data to display   ED Discharge Orders    None       Note:  This document was prepared using Dragon voice recognition software and may include unintentional dictation errors.   Lucrezia Starch, MD 07/17/20 2132

## 2020-07-17 NOTE — ED Notes (Signed)
Pt assisted off ED with wheelchair

## 2020-07-17 NOTE — ED Triage Notes (Signed)
Pt to ED via [facility] for increasing confusion. Pt states that she does not remember anything from 0200 until she woke up this morning. Pt states that she does not remember taking her teeth out last night, she doesn't remember turning her TV off, and when she woke up she thought it was Sunday night instead of Sunday morning.   Pt states that she feel 2-3 months ago and hit her head, pt was seen in the ED at that time but her CT was negative. Pt states that since she fell. Her confusion seems to be getting worse. Pt is in NAD at this time.

## 2020-07-17 NOTE — ED Notes (Signed)
Pt reports her daughter called Dr. Lupita Dawn office and they told her to come to the ED and get a CT scan of the head because she fell a couple of months and is still having some memory problems.

## 2020-07-17 NOTE — ED Triage Notes (Signed)
Pt in via EMS from home with c/o falling 2-3 months ago and has progressively gotten more and more confused. Pt did receive a CT scan post fall and it was negative. 130/67, HR 74, FSBS 152. Chronic anemia and get transfusions every Wednesday

## 2020-07-17 NOTE — ED Notes (Signed)
Pt ambultaed to BR for urine sample. Upon back to bed pt endorsing increased pain behind eyes/temples. Pt denies any vision change with pain. PT stating that pain is dissipating with time.

## 2020-07-17 NOTE — ED Notes (Signed)
Pt given crackers, peanut butter, and apple juice, ok per Dr. Cheri Fowler

## 2020-07-18 ENCOUNTER — Telehealth: Payer: Self-pay | Admitting: Internal Medicine

## 2020-07-18 NOTE — Telephone Encounter (Signed)
Pt was seen at the ED yesterday for memory problems  Pt said that her CT scan was fine but feels like her "head is about to explode"  Pt would like a call back to discuss

## 2020-07-18 NOTE — Telephone Encounter (Signed)
Overall unclear etiology of patient's memory difficulties earlier this morning.  No evidence on CT of any acute injury or other acute intracranial process.  No findings on exam to suggest CVA.  Patient is not hypoglycemic in emergency room and sounds like she checked her sugar and she was not hypoglycemic then.  There were no historical or exam findings to suggest other acute infectious process and UA is unremarkable for evidence of acute infection.  CBC is unremarkable for evidence of leukocytosis or acute anemia.  CMP shows no significant electrolyte or metabolic derangements.  Presentation is not consistent with hypertensive emergency, meningitis, significant metabolic derangement, toxic ingestion or other clear etiology for some difficulty with her memory this morning.  Given stable vital signs with otherwise nonfocal exam and patient stating she feels better and her daughter stating she has been back to baseline otherwise reassuring CT head and work-up I believe she is safe for discharge with plan for outpatient PCP and neurology follow-up.  Discharged stable condition.  Return precautions advised and discussed. Overall unclear etiology of patient's memory difficulties earlier this morning.  No evidence on CT of any acute injury or other acute intracranial process.  No findings on exam to suggest CVA.  Patient is not hypoglycemic in emergency room and sounds like she checked her sugar and she was not hypoglycemic then.  There were no historical or exam findings to suggest other acute infectious process and UA is unremarkable for evidence of acute infection.  CBC is unremarkable for evidence of leukocytosis or acute anemia.  CMP shows no significant electrolyte or metabolic derangements.  Presentation is not consistent with hypertensive emergency, meningitis, significant metabolic derangement, toxic ingestion or other clear etiology for some difficulty with her memory this morning.  Given stable  vital signs with otherwise nonfocal exam and patient stating she feels better and her daughter stating she has been back to baseline otherwise reassuring CT head and work-up I believe she is safe for discharge with plan for outpatient PCP and neurology follow-up.  Discharged stable condition.  Return precautions advised and discussed.   Patient was evaluated in ER on 1/2/21ER note above        [place] [facility] Patient Name: Denise Sharp Gender: Female DOB: 16-Aug-1945 Age: 75 Y 9 M 22 D Return Phone Number: [phone] (Primary), [phone] (Secondary) Address: City/State/Zip: [place] [address] Client [facility] Night - Cl Client Site [place] Physician Deborra Medina - MD Contact Type Call Who Is Calling Patient / Member / Family / Caregiver Call Type Triage / Clinical Caller Name Margreta Journey Relationship To Patient Daughter Return Phone Number [phone] (Secondary) Chief Complaint CONFUSION - new onset Reason for Call Symptomatic / Request for [place] states her mother is disoriented with on set confusion. Translation No Nurse Assessment Nurse: Velta Addison, RN, [place] Date/Time (Eastern Time): 07/17/2020 10:06:35 AM Confirm and document reason for call. If symptomatic, describe symptoms. ---Caller states her mother is disoriented with new on set confusion. Caller states she was fine yesterday. Called this morning and states she has her days and nights messed up, wants to go to bed, FSBS 151 mg/dl, wants to know why she has her morning meds in her case when she needs to take her night meds. Daughter states she knows certain things, like she knows her, her dad, but its like she has lost a whole  day from the time she went to sleep to now. Does the patient have any new or worsening symptoms?  ---Yes Will a triage be completed? ---Yes Related visit to physician within the last 2 weeks? ---No Does the PT have any chronic conditions? (i.e. diabetes, asthma, this includes High risk factors for pregnancy, etc.) ---Unknown Is this a behavioral health or substance abuse call? ---No Guidelines Guideline Title Affirmed Question Affirmed Notes Nurse Date/Time (Eastern Time) Neurologic Deficit Difficult to awaken or acting confused (e.g., disoriented, slurred speech) Parrott, RN, [place] 07/17/2020 10:13:56 AM Disp. Time [name] Time) Disposition Final User 07/17/2020 10:05:26 AM Send to Urgent Ellison Carwin, [place] PLEASE NOTE: All timestamps contained within this report are represented as [place] Standard Time. CONFIDENTIALTY NOTICE: This fax transmission is intended only for the addressee. It contains information that is legally privileged, confidential or otherwise protected from use or disclosure. If you are not the intended recipient, you are strictly prohibited from reviewing, disclosing, copying using or disseminating any of this information or taking any action in reliance on or regarding this information. If you have received this fax in error, please notify us immediately by telephone so that we can arrange for its return to [place]. Phone: [phone], Toll-Free: [phone], Fax: [phone] Page: 2 of 2 Call Id: [id number] [place]. Time [name] Time) Disposition Final User 07/17/2020 10:17:26 AM 911 Outcome Documentation Parrott, RN, [place] Reason: Daughter was calling from her house. She is getting her father to call 911 from their address 07/17/2020 10:14:18 AM Call EMS 911 Now Yes Parrott, RN, Interior and spatial designer Understands Yes PreDisposition InappropriateToAsk Care Advice Given Per Guideline CALL EMS 911 NOW: CARE ADVICE given per Neurologic Deficit (Adult) guideline. Comments User: Hamilton Capri, RN Date/Time (Eastern Time): 07/17/2020 10:15:42  AM Daughter states she has neuropathy so they cannot tell if she is having new numbness/weakeness, her dad said she does not have any facial pulling or abnormalities that he can tell. 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 She keeps going from subject to subject, cannot concentrate, not focused. Referrals GO TO FACILITY UNDECIDE

## 2020-07-18 NOTE — Telephone Encounter (Signed)
Spoke with patient scheduled fro virtual on Friday headache is better, scheduled ER follow up. Advised symptoms worsen to callback or to be evaluated.

## 2020-07-20 ENCOUNTER — Inpatient Hospital Stay: Payer: Medicare Other | Attending: Internal Medicine

## 2020-07-20 ENCOUNTER — Ambulatory Visit: Payer: Medicare Other | Admitting: Pharmacist

## 2020-07-20 VITALS — BP 130/59 | HR 67 | Temp 97.3°F | Resp 16

## 2020-07-20 DIAGNOSIS — D509 Iron deficiency anemia, unspecified: Secondary | ICD-10-CM | POA: Diagnosis not present

## 2020-07-20 DIAGNOSIS — I10 Essential (primary) hypertension: Secondary | ICD-10-CM

## 2020-07-20 DIAGNOSIS — IMO0002 Reserved for concepts with insufficient information to code with codable children: Secondary | ICD-10-CM

## 2020-07-20 DIAGNOSIS — Z79899 Other long term (current) drug therapy: Secondary | ICD-10-CM | POA: Diagnosis not present

## 2020-07-20 DIAGNOSIS — I25110 Atherosclerotic heart disease of native coronary artery with unstable angina pectoris: Secondary | ICD-10-CM

## 2020-07-20 DIAGNOSIS — E1140 Type 2 diabetes mellitus with diabetic neuropathy, unspecified: Secondary | ICD-10-CM

## 2020-07-20 DIAGNOSIS — E611 Iron deficiency: Secondary | ICD-10-CM

## 2020-07-20 DIAGNOSIS — E1165 Type 2 diabetes mellitus with hyperglycemia: Secondary | ICD-10-CM

## 2020-07-20 MED ORDER — SODIUM CHLORIDE 0.9 % IV SOLN
Freq: Once | INTRAVENOUS | Status: AC
  Filled 2020-07-20: qty 250

## 2020-07-20 MED ORDER — SODIUM CHLORIDE 0.9 % IV SOLN
510.0000 mg | Freq: Once | INTRAVENOUS | Status: AC
  Administered 2020-07-20: 510 mg via INTRAVENOUS
  Filled 2020-07-20: qty 510

## 2020-07-20 NOTE — Progress Notes (Signed)
Patient received Feraheme 12/29. ER visit 1/2 due to memory issues/disorientation. She inquires whether the infusion and neurologic symptoms may be related. Denise Casa NP to chairside. Will proceed with infusion today. Tolerated Feraheme well. Discharged home in stable condition.

## 2020-07-20 NOTE — Patient Instructions (Signed)
Visit Information  Patient Care Plan: Medication Management    Problem Identified: Diabetes, CHF     Long-Range Goal: Disease Progression Prevention   Recent Progress: On track  Priority: High  Note:   Current Barriers:  . Unable to achieve control of diabetes   Pharmacist Clinical Goal(s):  [name] Over the next 90 days, patient will achieve control of diabetes as evidenced by improvement in A1c through collaboration with PharmD and provider  Interventions: . Inter-disciplinary care team collaboration (see longitudinal plan of care) . Comprehensive medication review performed; medication list updated in electronic medical record  Diabetes: . Uncontrolled; current treatment: Farxiga 10 mg daily, Lantus 10 units daily, Novolog up to 8 units up to TID with meals  o Hx intolerance to metformin . Calls today for status on order for [place] 2 to [place]. Communication from [place] notes that further documentation regarding usage of mealtime insulin. Patient has upcoming appointment w/ PCP on Friday, will discuss at that time . She also reports recent ED visit for confusion. Work up negative. Patient wonders if Novolog can cause confusion. Discussed that if she is not having episodes of hypoglycemia, confusion is unlikely with this medication. However, she is still interested in considering alternative mealtime insulin. Discussed that we could try Humalog, but I am not convinced that her confusion is related to Novolog itself. She will discuss further with PCP at appt on Friday.   Hyperlipidemia: . Controlled; current treatment: atorvastatin 40 mg daily  . Recommended to continue current regimen at this time  Depression/Anxiety with Insomnia: . Moderately well managed. Confusion with day vs night after a nap lately. Current treatment: citalopram 10 mg daily, bupropion 150 mg QAM, 75 mg QPM, diazepam 5 mg QPM, Lunesta 3 mg QPM . Discuss use of multiple sedating medications with PCP at  upcoming appointment, given episode of confusion.   Chronic Diarrhea, S/p Gastric Bypass, Esophageal "growth" . Moderately well managed, given patient history. Current regimen: dicyclomine 10 mg QID PRN, hyoscyamine 10 mg QID PRN  Nocturnal "leg cramps" . More exacerbated recently per patient report. Current regimen: pregabalin 100 mg QPM PRN, levetiracetam 500 mg QPM PRN . Recommended to continue current regimen at this time  COPD:  . Well controlled; Current regimen: Anoro 62.5/25 mcg daily, albuterol HFA PRN  . Continue current regimen at this time  Patient Goals/Self-Care Activities . Over the next 90 days, patient will:  - take medications as prescribed check blood glucose TID using CGM, document, and provide at future appointments  Follow Up Plan: Face to Face appointment with care management team member scheduled for:  ~ 2 weeks      The patient verbalized understanding of instructions, educational materials, and care plan provided today and declined offer to receive copy of patient instructions, educational materials, and care plan.    Plan: Face to Face appointment with care management team member scheduled for:  ~ 2 weeks as previously scheduled   Catie Darnelle Maffucci, PharmD, [place], [place] Clinical Pharmacist [employer] at [facility] [phone]

## 2020-07-20 NOTE — Chronic Care Management (AMB) (Signed)
Chronic Care Management   Pharmacy Note  07/20/2020 Name: Denise Sharp MRN: [id number] DOB: 1945-08-14  Subjective:  Denise Sharp is a 75 y.o. year old female who is a primary care patient of [name], Aris Everts, MD. The CCM team was consulted for assistance with chronic disease management and care coordination needs.    Engaged with patient by telephone for response to her call regarding medication monitoring access in response to provider referral for pharmacy case management and/or care coordination services.   Consent to Services:  Patient was given information about Chronic Care Management services, agreed to services, and gave verbal consent prior to initiation of services on 03/15/20. Please see initial visit note for detailed documentation.   Objective:  Lab Results  Component Value Date   CREATININE 1.03 (H) 07/17/2020   CREATININE 1.20 05/24/2020   CREATININE 1.05 (H) 03/23/2020    Lab Results  Component Value Date   HGBA1C 8.5 (H) 05/24/2020       Component Value Date/Time   CHOL 134 05/24/2020 1532   CHOL 155 02/27/2014 0420   TRIG 128.0 05/24/2020 1532   TRIG 121 02/27/2014 0420   HDL 60.40 05/24/2020 1532   HDL 43 02/27/2014 0420   CHOLHDL 2 05/24/2020 1532   VLDL 25.6 05/24/2020 1532   VLDL 24 02/27/2014 0420   LDLCALC 48 05/24/2020 1532   LDLCALC 88 02/27/2014 0420   LDLDIRECT 111.0 06/16/2015 0936    BP Readings from Last 3 Encounters:  07/17/20 127/61  07/13/20 125/72  07/06/20 (!) 125/58    Assessment/Interventions: Review of patient past medical history, allergies, medications, health status, including review of consultants reports, laboratory and other test data, was performed as part of comprehensive evaluation and provision of chronic care management services.   SDOH (Social Determinants of Health) assessments and interventions performed:    CCM Care Plan  Allergies  Allergen Reactions  . Demeclocycline Hives  . Erythromycin Nausea  And Vomiting and Other (See Comments)    Severe irritable bowel  . Flagyl [Metronidazole] Nausea And Vomiting and Other (See Comments)    Severe irritable bowel  . Glucophage [Metformin Hcl] Nausea And Vomiting and Other (See Comments)    "Sick" "I won't take anything that has metformin in it"  . Tetracyclines & Related Hives and Rash  . Diovan [Valsartan] Nausea Only       . Sulfa Antibiotics Rash and Other (See Comments)    As child  . Xanax [Alprazolam] Other (See Comments)    Unknown reaction    Medications Reviewed Today    Reviewed by Lorrin Jackson, CMA (Certified Medical Assistant) on 06/28/20 at 1428  Med List Status: <None>  Medication Order Taking? Sig Documenting Provider Last Dose Status Informant  albuterol (VENTOLIN HFA) 108 (90 Base) MCG/ACT inhaler [id number] Yes Inhale 2 puffs into the lungs every 8 (eight) hours as needed for wheezing or shortness of breath. [name], MD Taking Active Self  aspirin EC 81 MG tablet [id number] Yes Take 1 tablet (81 mg total) by mouth daily. Fritzi Mandes, MD Taking Active   atorvastatin (LIPITOR) 40 MG tablet [id number] Yes Take 1 tablet (40 mg total) by mouth daily. Rise Mu, PA-C Taking Active   benzonatate (TESSALON) 200 MG capsule [id number] Yes Take 1 capsule (200 mg total) by mouth 2 (two) times daily as needed for cough. [name], MD Taking Active   buPROPion [facility]) 75 MG tablet [id number] Yes TAKE 2 TABLETS BY MOUTH EVERY MORNING  AND 1 TABLET IN THE AFTERNOON [name], MD Taking Active   butalbital-acetaminophen-caffeine (FIORICET) [phone] MG tablet [id number] Yes TAKE 1 TABLET BY MOUTH EVERY 6 HOURS AS NEEDED [name], MD Taking Active   citalopram (CELEXA) 10 MG tablet [id number] Yes Take 1 tablet (10 mg total) by mouth daily. [name], MD Taking Active   clopidogrel (PLAVIX) 75 MG tablet [id number] Yes Take 1 tablet (75 mg total) by mouth daily. Rise Mu, PA-C Taking Active    clotrimazole-betamethasone Donalynn Furlong) cream [id number] Yes Apply 1 application topically 2 (two) times daily.  Patient taking differently: Apply 1 application topically 2 (two) times daily as needed (rash).   Gae Dry, MD Taking Active Self  Continuous Blood Gluc Receiver (FREESTYLE LIBRE 2 READER) [place] [id number] Yes Use to check sugar at least TID [name], MD Taking Active   Continuous Blood Gluc Sensor (FREESTYLE LIBRE [place]) [place] [id number] Yes Use to check sugar at least 3 times daily [name], MD Taking Active   cyanocobalamin (,VITAMIN B-12,) 1000 MCG/ML injection [id number] Yes Inject 1 ml (1000 mcg ) IM weekly x 4,  Then monthly thereafter  Patient taking differently: Inject 1,000 mcg into the muscle every 30 (thirty) days. On the 12th day   [name], MD Taking Active Self  dapagliflozin propanediol (FARXIGA) 10 MG TABS tablet [id number] Yes Take 1 tablet (10 mg total) by mouth daily before breakfast. [name], MD Taking Active   diazepam (VALIUM) 5 MG tablet [id number] Yes Take 1 tablet (5 mg total) by mouth at bedtime as needed for anxiety. [name], MD Taking Active   dicyclomine (BENTYL) 10 MG capsule [id number] Yes Take 1 capsule (10 mg total) by mouth 4 (four) times daily -  before meals and at bedtime. [name], MD Taking Active Self           Med Note Gillis Santa Dec 31, 2019  2:50 AM) Pt takes differently: take 1 capsule 4 times a day as needed.  diphenhydrAMINE (BENADRYL) 25 MG tablet [id number] Yes Take 50 mg by mouth daily as needed for allergies.  [provider] Taking Active Self  diphenoxylate-atropine (LOMOTIL) 2.5-0.025 MG tablet [id number] Yes Take 1 tablet by mouth 4 (four) times daily as needed for diarrhea or loose stools. [name], MD Taking Active   Eszopiclone 3 MG TABS [id number] Yes Take 1 tablet (3 mg total) by mouth at bedtime. [name], MD Taking Active   hyoscyamine (ANASPAZ)  0.125 MG TBDP disintergrating tablet [id number] Yes DISSOLVE UNDER TONGUE 15 MINUTES BEFORE MEALS [name], MD Taking Active   insulin aspart (NOVOLOG FLEXPEN) 100 UNIT/ML FlexPen [id number] Yes Inject 8 units up to 3 times daily before meals [name], MD Taking Active   Insulin Glargine (LANTUS SOLOSTAR) 100 UNIT/ML Solostar Pen [id number] Yes Inject 11 Units into the skin daily.  Patient taking differently: Inject 10-15 Units into the skin daily.   [name], MD Taking Active            Med Note ([place] May 24, 2020  3:06 PM) 8 units  Insulin Pen Needle (DROPLET PEN NEEDLES) 31G X 5 MM MISC [id number] Yes USE ONE NEEDLE SUBCUTANEOUSLY AS DIRECTED (REMOVE AND DISCARD NEEDLE IN SHARPS CONTAINER IMMEDIATELY AFTER USE) [name], MD Taking Active   Insulin Syringe-Needle U-100 (INSULIN SYRINGE 1CC/30GX5/16") 30G X 5/16"  [place] [id number] Yes 1 Stick by Does not apply route 3 (three) times daily as needed. DX.E11.65 [name], MD Taking Active   ipratropium (ATROVENT) 0.06 % nasal spray [id number] Yes Place 2 sprays into both nostrils 4 (four) times daily. [name], MD Taking Active Self           Med Note Gillis Santa Dec 31, 2019  2:55 AM) Pt takes differently: Place 2 sprays into each nostril 4 times daily as needed.  levETIRAcetam (KEPPRA) 750 MG tablet [id number] Yes Take 1 tablet (750 mg total) by mouth at bedtime. Kathrynn Ducking, MD Taking Active Self           Med Note Gillis Santa Dec 31, 2019  2:58 AM) Pt takes differently: Take 1 tablet at bedtime as needed.  loperamide (IMODIUM) 2 MG capsule [id number] Yes Take by mouth. [provider] Taking Active   methocarbamol (ROBAXIN) 500 MG tablet [id number] Yes Take 500 mg by mouth at bedtime. [provider] Taking Active   neomycin-polymyxin-hydrocortisone (CORTISPORIN) OTIC solution [id number] Yes Place 3 drops into both ears 4 (four) times daily. For otitis  externa [name], MD Taking Active   nitroGLYCERIN (NITROSTAT) 0.4 MG SL tablet [id number] Yes Place 1 tablet (0.4 mg total) under the tongue every 5 (five) minutes as needed for chest pain. Fritzi Mandes, MD Taking Active   NONFORMULARY OR COMPOUNDED [place] [id number] Yes [facility]  Combination Pain Cream -  Baclofen 2%, Doxepin 5%, Gabapentin 6%, Topiramate 2%, Pentoxifylline 3% Apply 1-2 grams to affected area 3-4 times daily Qty. 120 gm 3 refills Gardiner Barefoot, DPM Taking Active Self           Med Note Rocky Crafts   Thu Dec 31, 2019  3:01 AM) Pt takes differently: Apply 3-4 times daily as needed.  nystatin (MYCOSTATIN/NYSTOP) powder [id number] Yes Apply 1 g topically 4 (four) times daily as needed (rash).  [provider] Taking Active Self  ondansetron (ZOFRAN ODT) 4 MG disintegrating tablet [id number] Yes Take 1 tablet (4 mg total) by mouth every 8 (eight) hours as needed for nausea or vomiting. [name], MD Taking Active Self  OVER THE COUNTER MEDICATION [id number] Yes Apply 1 application topically daily as needed (pain). [place] G[place] [provider] Taking Active Self  pregabalin (LYRICA) 100 MG capsule [id number] Yes Take 100 mg by mouth at bedtime as needed (leg cramps).  [provider] Taking Active Self           Med Note Caryn Section, KYLE A   Fri Jun 05, 2019  9:41 AM)    traMADol (ULTRAM) 50 MG tablet [id number] Yes Take 0.5 tablets (25 mg total) by mouth every 12 (twelve) hours as needed for severe pain. Elmarie Shiley, MD Taking Active Self           Med Note Rocky Crafts   Thu Dec 31, 2019  3:03 AM) Pt takes differently: Take 1 tablet (50 mg) every 12 hours as needed for severe pain.  umeclidinium-vilanterol (ANORO ELLIPTA) 62.5-25 MCG/INH AEPB [id number] Yes Inhale 1 puff into the lungs daily. [name], MD Taking Active   Med List Note Jaymes Graff 07/22/17 1307): CPAP          Patient Active Problem List    Diagnosis Date Noted  . Iron deficiency 06/28/2020  . Skin lesion of chest wall 06/10/2020  .  Elevated alkaline phosphatase level 06/10/2020  . Coagulation defect ([place]) 06/06/2020  . Chronic heart failure with preserved ejection fraction (HFpEF) ([place]) 01/14/2020  . Hyperlipidemia LDL goal <70 01/14/2020  . Coronary artery disease involving native coronary artery of native heart without angina pectoris 01/13/2020  . S/P cardiac catheterization 01/13/2020  . Angina at rest [facility]) 12/31/2019  . Dysphagia 11/30/2019  . Gastritis 08/07/2019  . Lab test negative for COVID-19 virus 08/07/2019  . S/P panniculectomy 06/16/2019  . Major depressive disorder with current active episode 05/30/2019  . Exposure to COVID-19 virus 12/25/2018  . Functional diarrhea 10/22/2018  . Bilateral cataracts 06/26/2018  . Status post bariatric surgery 02/05/2018  . GAD (generalized anxiety disorder) 02/01/2018  . Rectocele 01/24/2018  . Vaginal prolapse 01/04/2018  . Hepatic steatosis 01/04/2018  . Microalbuminuria due to type 2 diabetes mellitus ([place]) 11/21/2017  . Hospital discharge follow-up 09/22/2017  . Hypocalcemia 09/22/2017  . Hypotension 09/22/2017  . Fecal incontinence 09/22/2017  . B12 deficiency 09/22/2017  . Rotator cuff arthropathy, right 08/13/2017  . Primary localized osteoarthrosis of shoulder 08/13/2017  . Encounter for preoperative examination for general surgical procedure 08/03/2017  . 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 Candidiasis, intertrigo 08/03/2017  . 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 Edema 12/13/2015  . Hypersomnia, persistent 06/18/2015  . Mechanical breakdown of implanted electronic neurostimulator of peripheral nerve ([place]) 10/16/2014  . Obesity hypoventilation syndrome ([place]) 08/13/2014  . Frequent falls 06/23/2014  . Chronic cough 05/05/2014  . Adenoma of left adrenal gland 03/24/2014  . Vitamin D  deficiency 01/06/2014  . Weight gain following gastric bypass surgery 12/03/2013  . Morbid obesity ([place]) 08/25/2013  . Diaphragmatic hernia 04/06/2013  . Diabetic polyneuropathy ([place]) 03/25/2013  . Abnormality of gait 03/25/2013  . Multiple pulmonary nodules 03/20/2013  . Dyspnea on exertion 03/20/2013  . Anemia 08/06/2012  . Iron deficiency anemia 08/06/2012  . Functional disorder of bladder 07/15/2012  . Incomplete bladder emptying 07/15/2012  . 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 Urge incontinence 07/15/2012  . Increased frequency of urination 07/15/2012  . OSA on CPAP 03/02/2012  . Insomnia secondary to anxiety 02/29/2012  . Sciatica 09/05/2011  . Cervical stenosis of spinal canal 06/14/2011  . Restless legs syndrome 03/13/2011  . Degenerative disk disease 03/13/2011  . Essential hypertension 03/12/2011  . Uncontrolled type 2 diabetes with neuropathy ([place]) 03/12/2011  . Hearing loss, right 03/12/2011    Conditions to be addressed/monitored: CAD, HTN and DM  Patient Care Plan: Medication Management    Problem Identified: Diabetes, CHF     Long-Range Goal: Disease Progression Prevention   Recent Progress: On track  Priority: High  Note:   Current Barriers:  . Unable to achieve control of diabetes   Pharmacist Clinical Goal(s):  [name] Over the next 90 days, patient will achieve control of diabetes as evidenced by improvement in A1c through collaboration with PharmD and provider  Interventions: . Inter-disciplinary care team collaboration (see longitudinal plan of care) . Comprehensive medication review performed; medication list updated in electronic medical record  Diabetes: . Uncontrolled; current treatment: Farxiga 10 mg daily, Lantus 10 units daily, Novolog up to 8 units up to TID with meals  o Hx intolerance to metformin . Calls today for status on order for [place] 2 to [place]. Communication from [place] notes that further documentation  regarding usage of mealtime insulin. Patient has upcoming appointment w/ PCP on Friday, will discuss at that  time . She also reports recent ED visit for confusion. Work up negative. Patient wonders if Novolog can cause confusion. Discussed that if she is not having episodes of hypoglycemia, confusion is unlikely with this medication. However, she is still interested in considering alternative mealtime insulin. Discussed that we could try Humalog, but I am not convinced that her confusion is related to Novolog itself. She will discuss further with PCP at appt on Friday.   Hyperlipidemia: . Controlled; current treatment: atorvastatin 40 mg daily  . Recommended to continue current regimen at this time  Depression/Anxiety with Insomnia: . Moderately well managed. Confusion with day vs night after a nap lately. Current treatment: citalopram 10 mg daily, bupropion 150 mg QAM, 75 mg QPM, diazepam 5 mg QPM, Lunesta 3 mg QPM . Discuss use of multiple sedating medications with PCP at upcoming appointment, given episode of confusion.   Chronic Diarrhea, S/p Gastric Bypass, Esophageal "growth" . Moderately well managed, given patient history. Current regimen: dicyclomine 10 mg QID PRN, hyoscyamine 10 mg QID PRN  Nocturnal "leg cramps" . More exacerbated recently per patient report. Current regimen: pregabalin 100 mg QPM PRN, levetiracetam 500 mg QPM PRN . Recommended to continue current regimen at this time  COPD:  . Well controlled; Current regimen: [place] 62.5/25 mcg daily, albuterol HFA PRN  . Continue current regimen at this time  Patient Goals/Self-Care Activities . Over the next 90 days, patient will:  - take medications as prescribed check blood glucose TID using CGM, document, and provide at future appointments  Follow Up Plan: Face to Face appointment with care management team member scheduled for:  ~ 2 weeks      Medication Assistance: None required. Patient affirms current coverage  meets needs.   Plan: Face to Face appointment with care management team member scheduled for:  ~ 2 weeks as previously scheduled   Catie Darnelle Maffucci, PharmD, [place], [place] Clinical Pharmacist [employer] at [facility] [phone]

## 2020-07-22 ENCOUNTER — Telehealth (INDEPENDENT_AMBULATORY_CARE_PROVIDER_SITE_OTHER): Payer: Medicare Other | Admitting: Internal Medicine

## 2020-07-22 ENCOUNTER — Other Ambulatory Visit: Payer: Self-pay

## 2020-07-22 ENCOUNTER — Other Ambulatory Visit (INDEPENDENT_AMBULATORY_CARE_PROVIDER_SITE_OTHER): Payer: Medicare Other

## 2020-07-22 ENCOUNTER — Encounter: Payer: Self-pay | Admitting: Internal Medicine

## 2020-07-22 VITALS — Temp 98.3°F | Ht 65.0 in | Wt 243.0 lb

## 2020-07-22 DIAGNOSIS — IMO0002 Reserved for concepts with insufficient information to code with codable children: Secondary | ICD-10-CM

## 2020-07-22 DIAGNOSIS — E1140 Type 2 diabetes mellitus with diabetic neuropathy, unspecified: Secondary | ICD-10-CM | POA: Diagnosis not present

## 2020-07-22 DIAGNOSIS — Z20828 Contact with and (suspected) exposure to other viral communicable diseases: Secondary | ICD-10-CM | POA: Diagnosis not present

## 2020-07-22 DIAGNOSIS — R519 Headache, unspecified: Secondary | ICD-10-CM

## 2020-07-22 DIAGNOSIS — R410 Disorientation, unspecified: Secondary | ICD-10-CM

## 2020-07-22 DIAGNOSIS — Z20822 Contact with and (suspected) exposure to covid-19: Secondary | ICD-10-CM | POA: Diagnosis not present

## 2020-07-22 DIAGNOSIS — E1165 Type 2 diabetes mellitus with hyperglycemia: Secondary | ICD-10-CM

## 2020-07-22 LAB — POCT INFLUENZA A/B
Influenza A, POC: NEGATIVE
Influenza B, POC: NEGATIVE

## 2020-07-22 NOTE — Progress Notes (Addendum)
Telephone  Note  This visit type was conducted due to national recommendations for restrictions regarding the COVID-19 pandemic (e.g. social distancing).  This format is felt to be most appropriate for this patient at this time.  All issues noted in this document were discussed and addressed.  No physical exam was performed (except for noted visual exam findings with Video Visits).   I connected with@ on 07/22/20 at 11:00 AM EST by  telephone and verified that I am speaking with the correct person using two identifiers. Location patient: home Location provider: work or home office Persons participating in the virtual visit: patient, provider  I discussed the limitations, risks, security and privacy concerns of performing an evaluation and management service by telephone and the availability of in person appointments. I also discussed with the patient that there may be a patient responsible charge related to this service. The patient expressed understanding and agreed to proceed.  Reason for visit:   HPI:  Denise Sharp is a 75 yr old female with morbid obesity ,  OSA/OHS,history of bariatric surgery .  type 2 DM, and CAD with recent PTCA with stents who presents for ER follow up after waking up from with a prolonged disorientation episode which occurred after falling asleep without using CPAP.   By the time of the ER evaluation her disorientation had completely resolved.   ER records reviewed with patient including head CT and labs which ruled out UTI, deydhdration  and thyroid dysfunction. She was evaluated with a Head CT which was was negative for acute bleeds.     She continues to gain weight despite reporting frequent episodes of nausea .She cannot walk due to severe back pain ("excruciating") saw Ortho in September and plain films done.  Pain clinic referral offered by orthopedics but deferred    She is worried that she was exposed to [place] her ER STAY OF TEN HOURS  and wants to be  tested before she has her booster.  She has recurrent  headaches  Of uncertain etiology   History of anemia:   Hemoglobin improving. By January labs   OHS:  Discussed her need for deep breathing   To improve ventilation and oxygenation  T2DM;  Sugars have been uncontrolled without use of mealtime short acting insulin. She has been using 8 units of Novolog with each meal and monitoring her sugars closely. Fatigue  Has about 3 hours of workable energy    ROS: See pertinent positives and negatives per HPI.  Past Medical History:  Diagnosis Date  . Abnormality of gait 03/25/2013  . Adrenal mass, left ([place])   . Anemia    iron deficient post  2 unit txfsn 2009, normal endo/colonoscopy by [facility]  . Arthritis   . Atypical chest pain    a. 04/1986 Cath ([facility]): nl cors, EF 65%; b. 07/2012 Cath [facility]): LM nl, LAD/LCX/RCA w/ min irregs; c. 07/2016 MV [facility]): "Equivocal" w/ small inf wall rev defect and hyperdynamic LV contraction, EF 87%; d. 08/2016 Cardiac CT Ca2+ score [facility]): Ca2+ score 1548; e. 05/2019 MV: No ischemia. Diaph atten. EF 75%.  . Cervical spinal stenosis 1994   due to trauma to back ([employer] accident), has intermittent paralysis and parasthesias  . Cervicogenic headache 03/23/2014  . Depression   . Diastolic dysfunction    a. 12/2019 Echo: EF 65-70%, Gr2 DD. No significant valvular dzs.  . Dizziness    chronic dizziness  . DJD (degenerative joint disease)    a. Chronic R  shoulder pain pending R shoulder replacement 07/2017.  [name] Esophageal stenosis March 2011   with transietn outlet obstruction by food, cleared by EGD   . Family history of adverse reaction to anesthesia    daughter PONV  . Gastric bypass status for obesity   . Headache(784.0)   . Hypertension   . IBS (irritable bowel syndrome)   . Left bundle branch block (LBBB)    a. Intermittently present - likely rate related. - patient denies  . Obesity   . Obstructive sleep apnea    using CPAP  . Polyneuropathy in  diabetes(357.2) 03/25/2013  . Restless leg syndrome   . Rotator cuff arthropathy, right 08/13/2017  . Syncope and collapse 03/12/2014  . Type II diabetes mellitus ([place])     Past Surgical History:  Procedure Laterality Date  . ABDOMINAL HYSTERECTOMY    . APPENDECTOMY    . COLONOSCOPY    . CORONARY STENT INTERVENTION N/A 01/04/2020   Procedure: CORONARY STENT INTERVENTION;  Surgeon: [name], MD;  Location: [place] [facility];  Service: Cardiovascular;  Laterality: N/A;  LAD   . DIAPHRAGMATIC HERNIA REPAIR  2015  . ESOPHAGEAL DILATION     multiple  . ESOPHAGOGASTRODUODENOSCOPY    . [place]  . GALLBLADDER SURGERY  resection  . GASTRIC BYPASS    . GASTRIC BYPASS  2000, 2005   Dr. Debroah Loop  . [facility] IMPLANT PLACEMENT  April 2013   [place]  . JOINT REPLACEMENT  2007   bilateral knee. [name],  [name]  . LEFT HEART CATH AND CORS/GRAFTS ANGIOGRAPHY N/A 12/31/2019   Procedure: LEFT HEART CATH AND CORS/GRAFTS ANGIOGRAPHY;  Surgeon: [name], MD;  Location: [place] [facility];  Service: Cardiovascular;  Laterality: N/A;  . PANNICULECTOMY  06/16/2019  . PANNICULECTOMY N/A 06/16/2019   Procedure: PANNICULECTOMY;  Surgeon: [name], MD;  Location: [place];  Service: Plastics;  Laterality: N/A;  3 hours, please  . REVERSE SHOULDER ARTHROPLASTY Right 08/13/2017   Procedure: REVERSE RIGHT SHOULDER ARTHROPLASTY;  Surgeon: [name], MD;  Location: [place];  Service: Orthopedics;  Laterality: Right;  . ROTATOR CUFF REPAIR     right  . [place]  . TOTAL ABDOMINAL HYSTERECTOMY W/ BILATERAL SALPINGOOPHORECTOMY  1974  . UMBILICAL HERNIA REPAIR  Aug 11, 2015    Family History  Problem Relation Age of Onset  . Heart disease Father   . Hypertension Father   . Prostate cancer Father   . Stroke Father   . Osteoporosis Father   . Stroke Mother   . Depression Mother   . Headache Mother   . Heart disease Mother   . Thyroid disease  Mother   . Hypertension Mother   . Diabetes Daughter   . Heart disease Daughter   . Hypertension Daughter   . Hypertension Son     SOCIAL HX:  reports that she has never smoked. She has never used smokeless tobacco. She reports that she does not drink alcohol and does not use drugs.   Current Outpatient Medications:  .  albuterol (VENTOLIN HFA) 108 (90 Base) MCG/ACT inhaler, Inhale 2 puffs into the lungs every 8 (eight) hours as needed for wheezing or shortness of breath., Disp: 8 g, Rfl: 2 .  aspirin EC 81 MG tablet, Take 1 tablet (81 mg total) by mouth daily., Disp: 30 tablet, Rfl: 2 .  atorvastatin (LIPITOR) 40 MG tablet, Take 1 tablet (40 mg total) by mouth  daily., Disp: 90 tablet, Rfl: 0 .  buPROPion (WELLBUTRIN) 75 MG tablet, TAKE 2 TABLETS BY MOUTH EVERY MORNING AND 1 TABLET IN THE AFTERNOON, Disp: 270 tablet, Rfl: 1 .  butalbital-acetaminophen-caffeine (FIORICET) 50-325-40 MG tablet, TAKE 1 TABLET BY MOUTH EVERY 6 HOURS AS NEEDED, Disp: 60 tablet, Rfl: 5 .  citalopram (CELEXA) 10 MG tablet, Take 1 tablet (10 mg total) by mouth daily., Disp: 90 tablet, Rfl: 1 .  clopidogrel (PLAVIX) 75 MG tablet, Take 1 tablet (75 mg total) by mouth daily., Disp: 90 tablet, Rfl: 0 .  clotrimazole-betamethasone (LOTRISONE) cream, Apply 1 application topically 2 (two) times daily. (Patient taking differently: Apply 1 application topically 2 (two) times daily as needed (rash).), Disp: 45 g, Rfl: 3 .  Continuous Blood Gluc Receiver (FREESTYLE LIBRE 2 READER) DEVI, Use to check sugar at least TID, Disp: 1 each, Rfl: 1 .  Continuous Blood Gluc Sensor (FREESTYLE LIBRE 14 DAY SENSOR) MISC, Use to check sugar at least 3 times daily, Disp: 6 each, Rfl: 3 .  cyanocobalamin (,VITAMIN B-12,) 1000 MCG/ML injection, Inject 1 ml (1000 mcg ) IM weekly x 4,  Then monthly thereafter (Patient taking differently: Inject 1,000 mcg into the muscle every 30 (thirty) days. On the 12th day), Disp: 110 mL, Rfl: 0 .  dapagliflozin  propanediol (FARXIGA) 10 MG TABS tablet, Take 1 tablet (10 mg total) by mouth daily before breakfast., Disp: 90 tablet, Rfl: 3 .  diazepam (VALIUM) 5 MG tablet, Take 1 tablet (5 mg total) by mouth at bedtime as needed for anxiety., Disp: 30 tablet, Rfl: 5 .  dicyclomine (BENTYL) 10 MG capsule, Take 1 capsule (10 mg total) by mouth 4 (four) times daily -  before meals and at bedtime., Disp: 360 capsule, Rfl: 3 .  diphenhydrAMINE (BENADRYL) 25 MG tablet, Take 50 mg by mouth daily as needed for allergies. , Disp: , Rfl:  .  diphenoxylate-atropine (LOMOTIL) 2.5-0.025 MG tablet, Take 1 tablet by mouth 4 (four) times daily as needed for diarrhea or loose stools., Disp: 60 tablet, Rfl: 5 .  Eszopiclone 3 MG TABS, Take 1 tablet (3 mg total) by mouth at bedtime., Disp: 90 tablet, Rfl: 1 .  hyoscyamine (ANASPAZ) 0.125 MG TBDP disintergrating tablet, DISSOLVE UNDER TONGUE 15 MINUTES BEFORE MEALS, Disp: 360 tablet, Rfl: 1 .  insulin aspart (NOVOLOG FLEXPEN) 100 UNIT/ML FlexPen, Inject 8 units up to 3 times daily before meals, Disp: 15 mL, Rfl: 11 .  Insulin Glargine (LANTUS SOLOSTAR) 100 UNIT/ML Solostar Pen, Inject 11 Units into the skin daily. (Patient taking differently: Inject 10-15 Units into the skin daily.), Disp: 5 pen, Rfl: 3 .  Insulin Pen Needle (DROPLET PEN NEEDLES) 31G X 5 MM MISC, USE ONE NEEDLE SUBCUTANEOUSLY AS DIRECTED (REMOVE AND DISCARD NEEDLE IN SHARPS CONTAINER IMMEDIATELY AFTER USE), Disp: 100 each, Rfl: 1 .  Insulin Syringe-Needle U-100 (INSULIN SYRINGE 1CC/30GX5/16") 30G X 5/16" 1 ML MISC, 1 Stick by Does not apply route 3 (three) times daily as needed. DX.[id number], Disp: 100 each, Rfl: 0 .  ipratropium (ATROVENT) 0.06 % nasal spray, Place 2 sprays into both nostrils 4 (four) times daily., Disp: 15 mL, Rfl: 12 .  loperamide (IMODIUM) 2 MG capsule, Take by mouth., Disp: , Rfl:  .  nitroGLYCERIN (NITROSTAT) 0.4 MG SL tablet, Place 1 tablet (0.4 mg total) under the tongue every 5 (five) minutes  as needed for chest pain., Disp: 20 tablet, Rfl: 1 .  NONFORMULARY OR COMPOUNDED ITEM, [facility]  Combination Pain Cream -  Baclofen 2%, Doxepin 5%, Gabapentin 6%, Topiramate 2%, Pentoxifylline 3% Apply 1-2 grams to affected area 3-4 times daily Qty. 120 gm 3 refills, Disp: 1 each, Rfl: 3 .  nystatin (MYCOSTATIN/NYSTOP) powder, Apply 1 g topically 4 (four) times daily as needed (rash). , Disp: , Rfl:  .  pregabalin (LYRICA) 100 MG capsule, Take 100 mg by mouth at bedtime as needed (leg cramps). , Disp: , Rfl:  .  traMADol (ULTRAM) 50 MG tablet, Take 0.5 tablets (25 mg total) by mouth every 12 (twelve) hours as needed for severe pain., Disp: 30 tablet, Rfl: 0 .  umeclidinium-vilanterol (ANORO ELLIPTA) 62.5-25 MCG/INH AEPB, Inhale 1 puff into the lungs daily., Disp: 60 each, Rfl: 3  EXAM:  VITALS per patient if applicable:  GENERAL: alert, oriented, appears well and in no acute distress  HEENT: atraumatic, conjunttiva clear, no obvious abnormalities on inspection of external nose and ears  NECK: normal movements of the head and neck  LUNGS: on inspection no signs of respiratory distress, breathing rate appears normal, no obvious gross SOB, gasping or wheezing  CV: no obvious cyanosis  Denise: moves all visible extremities without noticeable abnormality  PSYCH/NEURO: pleasant and cooperative, no obvious depression or anxiety, speech and thought processing grossly intact  ASSESSMENT AND PLAN:  Discussed the following assessment and plan:  New onset of headaches after age 85 - Plan: Ambulatory referral to Neurology, POCT Influenza A/B  Exposure to COVID-19 virus - Plan: Novel Coronavirus, NAA ([facility])  Exposure to influenza - Plan: POCT Influenza A/B, CANCELED: POC Influenza A&B ([facility] test)  Transient disorientation  Uncontrolled type 2 diabetes with neuropathy (HCC)  Transient disorientation The most likely etiology was hypoxia from her OSA/OHS.  TIA less likely given use of  Plavix since her stent placement in June.  Referring to Neurology for their opinion per pateint request.   New onset of headaches after age 84 Likely cervicogenic given her history of cervical spine stenosis and recently normal head CT. However she is requesting neurologic evaluation and In light of her recent episdoe of transient disorientation,  Will refer as requested.  She is unable to have an MRI brain due to history of bladder interstim placement   Uncontrolled type 2 diabetes with neuropathy ([place]) She requires the use of short acting insulin with each meal and is currently using 8 units qac and adjusting using a sliding scale.  She requires the use of a CBG monitor to achieve tighter glycemic control.     I discussed the assessment and treatment plan with the patient. The patient was provided an opportunity to ask questions and all were answered. The patient agreed with the plan and demonstrated an understanding of the instructions.   The patient was advised to call back or seek an in-person evaluation if the symptoms worsen or if the condition fails to improve as anticipated.  I provided 30 minutes of non-face-to-face time during this encounter.   [name], MD

## 2020-07-24 NOTE — Assessment & Plan Note (Signed)
Likely cervicogenic given her history of cervical spine stenosis and recently normal head CT. However she is requesting neurologic evaluation and In light of her recent episdoe of transient disorientation,  Will refer as requested.  She is unable to have an MRI brain due to history of bladder interstim placement

## 2020-07-24 NOTE — Assessment & Plan Note (Addendum)
The most likely etiology was hypoxia from her OSA/OHS.  TIA less likely given use of Plavix since her stent placement in June.  Referring to Neurology for their opinion per pateint request.

## 2020-07-24 NOTE — Assessment & Plan Note (Signed)
She requires the use of short acting insulin with each meal and is currently using 8 units qac and adjusting using a sliding scale.  She requires the use of a CBG monitor to achieve tighter glycemic control.

## 2020-07-27 ENCOUNTER — Inpatient Hospital Stay: Payer: Medicare Other

## 2020-07-27 ENCOUNTER — Other Ambulatory Visit: Payer: Self-pay

## 2020-07-27 VITALS — BP 149/61 | HR 79 | Temp 98.9°F | Resp 24

## 2020-07-27 DIAGNOSIS — D509 Iron deficiency anemia, unspecified: Secondary | ICD-10-CM

## 2020-07-27 DIAGNOSIS — Z79899 Other long term (current) drug therapy: Secondary | ICD-10-CM | POA: Diagnosis not present

## 2020-07-27 DIAGNOSIS — E611 Iron deficiency: Secondary | ICD-10-CM

## 2020-07-27 LAB — NOVEL CORONAVIRUS, NAA: SARS-CoV-2, NAA: NOT DETECTED

## 2020-07-27 MED ORDER — SODIUM CHLORIDE 0.9 % IV SOLN
510.0000 mg | Freq: Once | INTRAVENOUS | Status: AC
  Administered 2020-07-27: 510 mg via INTRAVENOUS
  Filled 2020-07-27: qty 510

## 2020-07-27 MED ORDER — SODIUM CHLORIDE 0.9 % IV SOLN
Freq: Once | INTRAVENOUS | Status: AC
  Filled 2020-07-27: qty 250

## 2020-07-27 NOTE — Progress Notes (Signed)
   Your covid 19 test and influenza tests were negative.  You should continue to protect yourself from future infection with social distancing and masking   Regards,   Deborra Medina, MD

## 2020-07-27 NOTE — Progress Notes (Signed)
Pt tolerated feraheme infusion well with no complications.  Pt left infusion suite stable and ambulatory.  

## 2020-07-29 ENCOUNTER — Telehealth: Payer: Self-pay | Admitting: Internal Medicine

## 2020-07-29 MED ORDER — CYANOCOBALAMIN 1000 MCG/ML IJ SOLN: 1000.0000 ug | INTRAMUSCULAR | 1 refills | Status: DC

## 2020-07-29 NOTE — Telephone Encounter (Signed)
Pt needs a refill on cyanocobalamin (,VITAMIN B-12,) 1000 MCG/ML injection sent to [facility]

## 2020-07-29 NOTE — Addendum Note (Signed)
Addended by: Elpidio Galea T on: 07/29/2020 10:04 AM   Modules accepted: Orders

## 2020-08-01 ENCOUNTER — Institutional Professional Consult (permissible substitution): Payer: Medicare Other | Admitting: Neurology

## 2020-08-02 ENCOUNTER — Ambulatory Visit: Payer: Medicare Other

## 2020-08-03 ENCOUNTER — Ambulatory Visit: Payer: Medicare Other | Admitting: Pharmacist

## 2020-08-03 DIAGNOSIS — E1165 Type 2 diabetes mellitus with hyperglycemia: Secondary | ICD-10-CM

## 2020-08-03 DIAGNOSIS — IMO0002 Reserved for concepts with insufficient information to code with codable children: Secondary | ICD-10-CM

## 2020-08-03 DIAGNOSIS — D500 Iron deficiency anemia secondary to blood loss (chronic): Secondary | ICD-10-CM

## 2020-08-03 DIAGNOSIS — E1140 Type 2 diabetes mellitus with diabetic neuropathy, unspecified: Secondary | ICD-10-CM

## 2020-08-03 DIAGNOSIS — I10 Essential (primary) hypertension: Secondary | ICD-10-CM

## 2020-08-03 DIAGNOSIS — I25110 Atherosclerotic heart disease of native coronary artery with unstable angina pectoris: Secondary | ICD-10-CM

## 2020-08-03 NOTE — Chronic Care Management (AMB) (Signed)
Chronic Care Management Pharmacy Note  08/03/2020 Name:  Denise Sharp MRN:  [id number] DOB:  [birth year]  Subjective: Denise Sharp is an 75 y.o. year old female who is a primary patient of [name], Aris Everts, MD.  The CCM team was consulted for assistance with disease management and care coordination needs.    Engaged with patient by telephone for follow up visit in response to provider referral for pharmacy case management and/or care coordination services.   Consent to Services:  The patient was given information about Chronic Care Management services, agreed to services, and gave verbal consent prior to initiation of services.  Please see initial visit note for detailed documentation.   Objective:  Lab Results  Component Value Date   CREATININE 1.03 (H) 07/17/2020   CREATININE 1.20 05/24/2020   CREATININE 1.05 (H) 03/23/2020    Lab Results  Component Value Date   HGBA1C 8.5 (H) 05/24/2020       Component Value Date/Time   CHOL 134 05/24/2020 1532   CHOL 155 02/27/2014 0420   TRIG 128.0 05/24/2020 1532   TRIG 121 02/27/2014 0420   HDL 60.40 05/24/2020 1532   HDL 43 02/27/2014 0420   CHOLHDL 2 05/24/2020 1532   VLDL 25.6 05/24/2020 1532   VLDL 24 02/27/2014 0420   LDLCALC 48 05/24/2020 1532   LDLCALC 88 02/27/2014 0420   LDLDIRECT 111.0 06/16/2015 0936     BP Readings from Last 3 Encounters:  07/27/20 (!) 149/61  07/20/20 (!) 130/59  07/17/20 127/61    Assessment: Review of patient past medical history, allergies, medications, health status, including review of consultants reports, laboratory and other test data, was performed as part of comprehensive evaluation and provision of chronic care management services.   SDOH:  (Social Determinants of Health) assessments and interventions performed:  SDOH Interventions   Flowsheet Row Most Recent Value  SDOH Interventions   Financial Strain Interventions Intervention Not Indicated      CCM Care  Plan  Allergies  Allergen Reactions  . Demeclocycline Hives  . Erythromycin Nausea And Vomiting and Other (See Comments)    Severe irritable bowel  . Flagyl [Metronidazole] Nausea And Vomiting and Other (See Comments)    Severe irritable bowel  . Glucophage [Metformin Hcl] Nausea And Vomiting and Other (See Comments)    "Sick" "I won't take anything that has metformin in it"  . Tetracyclines & Related Hives and Rash  . Diovan [Valsartan] Nausea Only       . Sulfa Antibiotics Rash and Other (See Comments)    As child  . Xanax [Alprazolam] Other (See Comments)    Unknown reaction    Medications Reviewed Today    Reviewed by De Hollingshead, RPH-CPP (Pharmacist) on 08/03/20 at 1546  Med List Status: <None>  Medication Order Taking? Sig Documenting Provider Last Dose Status Informant  albuterol (VENTOLIN HFA) 108 (90 Base) MCG/ACT inhaler [id number]  Inhale 2 puffs into the lungs every 8 (eight) hours as needed for wheezing or shortness of breath. [name], MD  Active Self  aspirin EC 81 MG tablet [id number] Yes Take 1 tablet (81 mg total) by mouth daily. Fritzi Mandes, MD Taking Active   atorvastatin (LIPITOR) 40 MG tablet [id number] Yes Take 1 tablet (40 mg total) by mouth daily. Rise Mu, PA-C Taking Active   buPROPion [facility]) 75 MG tablet [id number] Yes TAKE 2 TABLETS BY MOUTH EVERY MORNING AND 1 TABLET IN THE AFTERNOON [name], MD Taking Active  butalbital-acetaminophen-caffeine (FIORICET) 50-325-40 MG tablet [id number]  TAKE 1 TABLET BY MOUTH EVERY 6 HOURS AS NEEDED [name], MD  Active   citalopram (CELEXA) 10 MG tablet [id number] Yes Take 1 tablet (10 mg total) by mouth daily. [name], MD Taking Active   clopidogrel (PLAVIX) 75 MG tablet [id number] Yes Take 1 tablet (75 mg total) by mouth daily. Rise Mu, PA-C Taking Active   clotrimazole-betamethasone Donalynn Furlong) cream [id number]  Apply 1 application topically 2 (two) times daily.  Patient  taking differently: Apply 1 application topically 2 (two) times daily as needed (rash).   Gae Dry, MD  Active   Continuous Blood Gluc Receiver (FREESTYLE LIBRE 2 READER) [place] [id number] Yes Use to check sugar at least TID [name], MD Taking Active   Continuous Blood Gluc Sensor (FREESTYLE LIBRE [place]) [place] [id number] Yes Use to check sugar at least 3 times daily [name], MD Taking Active   cyanocobalamin (,VITAMIN B-12,) 1000 MCG/ML injection [id number] Yes Inject 1 mL (1,000 mcg total) into the muscle every 30 (thirty) days. Inject 1 ml (1000 mcg ) IM weekly x 4,  Then monthly thereafter [name], MD Taking Active   dapagliflozin propanediol (FARXIGA) 10 MG TABS tablet [id number] Yes Take 1 tablet (10 mg total) by mouth daily before breakfast. [name], MD Taking Active   diazepam (VALIUM) 5 MG tablet [id number]  Take 1 tablet (5 mg total) by mouth at bedtime as needed for anxiety. [name], MD  Active   dicyclomine (BENTYL) 10 MG capsule [id number]  Take 1 capsule (10 mg total) by mouth 4 (four) times daily -  before meals and at bedtime. [name], MD  Active Self           Med Note Rocky Crafts   Thu Dec 31, 2019  2:50 AM) Pt takes differently: take 1 capsule 4 times a day as needed.  diphenhydrAMINE (BENADRYL) 25 MG tablet [id number]  Take 50 mg by mouth daily as needed for allergies.  [provider]  Active Self  diphenoxylate-atropine (LOMOTIL) 2.5-0.025 MG tablet [id number]  Take 1 tablet by mouth 4 (four) times daily as needed for diarrhea or loose stools. [name], MD  Active   Eszopiclone 3 MG TABS [id number] Yes Take 1 tablet (3 mg total) by mouth at bedtime. [name], MD Taking Active   hyoscyamine (ANASPAZ) 0.125 MG TBDP disintergrating tablet [id number] Yes DISSOLVE UNDER TONGUE 15 MINUTES BEFORE MEALS [name], MD Taking Active   insulin aspart (NOVOLOG FLEXPEN) 100 UNIT/ML FlexPen [id number] Yes Inject 8  units up to 3 times daily before meals [name], MD Taking Active   Insulin Glargine (LANTUS SOLOSTAR) 100 UNIT/ML Solostar Pen [id number] Yes Inject 11 Units into the skin daily.  Patient taking differently: Inject 10-15 Units into the skin daily.   [name], MD Taking Active            Med Note De Hollingshead   Tue May 24, 2020  3:06 PM) 8 units  Insulin Pen Needle (DROPLET PEN NEEDLES) 31G X 5 MM MISC [id number] Yes USE ONE NEEDLE SUBCUTANEOUSLY AS DIRECTED (REMOVE AND DISCARD NEEDLE IN SHARPS CONTAINER IMMEDIATELY AFTER USE) [name], MD Taking Active   Insulin Syringe-Needle U-100 (INSULIN SYRINGE 1CC/30GX5/16") 30G X 5/16" 1 ML MISC [id number]  1 Stick by Does not apply route 3 (three) times daily as needed. DX.E11.65 Deborra Medina  L, MD  Active   ipratropium (ATROVENT) 0.06 % nasal spray [id number]  Place 2 sprays into both nostrils 4 (four) times daily. [name], MD  Active Self           Med Note Gillis Santa Dec 31, 2019  2:55 AM) Pt takes differently: Place 2 sprays into each nostril 4 times daily as needed.  loperamide (IMODIUM) 2 MG capsule [id number]  Take by mouth. [provider]  Active   nitroGLYCERIN (NITROSTAT) 0.4 MG SL tablet [id number]  Place 1 tablet (0.4 mg total) under the tongue every 5 (five) minutes as needed for chest pain. Fritzi Mandes, MD  Active   NONFORMULARY OR COMPOUNDED [place] [id number]  [place]  Combination Pain Cream -  Baclofen 2%, Doxepin 5%, Gabapentin 6%, Topiramate 2%, Pentoxifylline 3% Apply 1-2 grams to affected area 3-4 times daily Qty. 120 gm 3 refills Gardiner Barefoot, DPM  Active Self           Med Note Rocky Crafts   Thu Dec 31, 2019  3:01 AM) Pt takes differently: Apply 3-4 times daily as needed.  nystatin (MYCOSTATIN/NYSTOP) powder [id number]  Apply 1 g topically 4 (four) times daily as needed (rash).  [provider]  Active Self  pregabalin (LYRICA) 100 MG capsule [id number] Yes Take  100 mg by mouth at bedtime as needed (leg cramps).  [provider] Taking Active Self           Med Note Caryn Section, [place] A   Fri Jun 05, 2019  9:41 AM)    traMADol (ULTRAM) 50 MG tablet [id number]  Take 0.5 tablets (25 mg total) by mouth every 12 (twelve) hours as needed for severe pain. Regalado, Cassie Freer, MD  Active Self           Med Note Rocky Crafts   Thu Dec 31, 2019  3:03 AM) Pt takes differently: Take 1 tablet (50 mg) every 12 hours as needed for severe pain.  umeclidinium-vilanterol (ANORO ELLIPTA) 62.5-25 MCG/INH AEPB [id number] Yes Inhale 1 puff into the lungs daily. [name], MD Taking Active   Med List Note Jaymes Graff 07/22/17 1307): CPAP          Patient Active Problem List   Diagnosis Date Noted  . Iron deficiency 06/28/2020  . Skin lesion of chest wall 06/10/2020  . Elevated alkaline phosphatase level 06/10/2020  . Coagulation defect ([place]) 06/06/2020  . Chronic heart failure with preserved ejection fraction (HFpEF) ([place]) 01/14/2020  . Hyperlipidemia LDL goal <70 01/14/2020  . Coronary artery disease involving native coronary artery of native heart without angina pectoris 01/13/2020  . S/P cardiac catheterization 01/13/2020  . Angina at rest [facility]) 12/31/2019  . Dysphagia 11/30/2019  . Gastritis 08/07/2019  . Lab test negative for COVID-19 virus 08/07/2019  . S/P panniculectomy 06/16/2019  . Major depressive disorder with current active episode 05/30/2019  . Exposure to COVID-19 virus 12/25/2018  . Functional diarrhea 10/22/2018  . Bilateral cataracts 06/26/2018  . Status post bariatric surgery 02/05/2018  . GAD (generalized anxiety disorder) 02/01/2018  . Rectocele 01/24/2018  . Vaginal prolapse 01/04/2018  . Hepatic steatosis 01/04/2018  . Microalbuminuria due to type 2 diabetes mellitus ([place]) 11/21/2017  . Hospital discharge follow-up 09/22/2017  . Hypocalcemia 09/22/2017  . Hypotension 09/22/2017  . Fecal incontinence 09/22/2017   . B12 deficiency 09/22/2017  . Transient disorientation 08/20/2017  . Rotator cuff arthropathy, right 08/13/2017  . Primary  localized osteoarthrosis of shoulder 08/13/2017  . Encounter for preoperative examination for general surgical procedure 08/03/2017  . 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 Candidiasis, intertrigo 08/03/2017  . 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 Edema 12/13/2015  . Hypersomnia, persistent 06/18/2015  . Mechanical breakdown of implanted electronic neurostimulator of peripheral nerve ([place]) 10/16/2014  . Obesity hypoventilation syndrome ([place]) 08/13/2014  . Frequent falls 06/23/2014  . Chronic cough 05/05/2014  . Adenoma of left adrenal gland 03/24/2014  . New onset of headaches after age 26 03/23/2014  . Vitamin D deficiency 01/06/2014  . Weight gain following gastric bypass surgery 12/03/2013  . Morbid obesity ([place]) 08/25/2013  . Diaphragmatic hernia 04/06/2013  . Diabetic polyneuropathy ([place]) 03/25/2013  . Abnormality of gait 03/25/2013  . Multiple pulmonary nodules 03/20/2013  . Dyspnea on exertion 03/20/2013  . Anemia 08/06/2012  . Iron deficiency anemia 08/06/2012  . Functional disorder of bladder 07/15/2012  . Incomplete bladder emptying 07/15/2012  . 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 Urge incontinence 07/15/2012  . Increased frequency of urination 07/15/2012  . OSA on CPAP 03/02/2012  . Insomnia secondary to anxiety 02/29/2012  . Sciatica 09/05/2011  . Cervical stenosis of spinal canal 06/14/2011  . Restless legs syndrome 03/13/2011  . Degenerative disk disease 03/13/2011  . Essential hypertension 03/12/2011  . Uncontrolled type 2 diabetes with neuropathy ([place]) 03/12/2011  . Hearing loss, right 03/12/2011    Conditions to be addressed/monitored: CHF, CAD, HTN, HLD, COPD and DMII  Care Plan : Medication Management  Updates made by De Hollingshead, RPH-CPP  since 08/03/2020 12:00 AM    Problem: Diabetes, CHF     Long-Range Goal: Disease Progression Prevention   This Visit's Progress: On track  Recent Progress: On track  Priority: High  Note:   Current Barriers:  . Unable to achieve control of diabetes   Pharmacist Clinical Goal(s):  [name] Over the next 90 days, patient will achieve control of diabetes as evidenced by improvement in A1c through collaboration with PharmD and provider  Interventions: . 1:1 collaboration with [name], MD regarding development and update of comprehensive plan of care as evidenced by provider attestation and co-signature . Inter-disciplinary care team collaboration (see longitudinal plan of care) . Comprehensive medication review performed; medication list updated in electronic medical record  Health Maintenance: . Reviews the recent episode of ED visit for confusion. Reports continued headache. Neurology appointment had to be r/s due to inclement weather  . Reviewed need for COVID booster, Shingrix vaccination. Patient asked when to receive these. Recommended that she and husband prioritize COVID vaccination. Noted that there is no specific time required for separation from Shingrix, but as both COVID booster (and Shingrix second dose) are more likely to have short term side effects, she can separate by a few weeks.  . She expressed concerns regarding wanting to be active at church but being concerned about exposure, as not everyone at her church wears masks. Encouraged to weigh her personal health and safety highly and remain masked when around others.   Diabetes: . Uncontrolled; current treatment: Farxiga 10 mg daily, Lantus 10 units daily, Novolog up to 8 units up to TID with meals  o Hx intolerance to metformin d/t diarrhea o Hx Trulicity - exacerbated diarrhea  o Hx glipizide - stopped previously when A1c was well controlled  o Hx Januvia - stopped previously when Trulicity was started  o Avoiding TZD  due to HF .  Current glucose readings:  Date of Download: 08/03/20 Average Glucose: ~124 mg/dL Time in Goal:  - Time in range 70-179: 87% - Time above range: 10 % - Time below range: 3 % - Denies any patterns of hypoglycemia. More so related to later meal times if she sleeps  . Received order for [facility] from [place].  . Recommend to continue current regimen at this time. Patient notes that she would like to eventually reduce daily injection burden. Discussed that we could consider re-starting DPP4 (consider Tradjenta d/t lack of renal dose adjustments) to see if it would allow for reduction in mealtime insulin moving forward.  . Will collaborate w/ PCP to determine when she would like f/u and lab work for patient.  Hyperlipidemia: . Controlled per last lab; current treatment: atorvastatin 40 mg daily  . Recommended to continue current regimen at this time  Depression/Anxiety with Insomnia: . Moderately well managed. Wonder if recent ED episode for confusion was related to afternoon napping. Current treatment: citalopram 10 mg daily, bupropion 150 mg QAM, 75 mg QPM, diazepam 5 mg QPM, Lunesta 3 mg QPM . Continue to monitor for increased sedation w/ multiple CNS sedating medications. Neurology appointment upcoming to work up reason for episode of confusion earlier this month.  Chronic Diarrhea, S/p Gastric Bypass, Esophageal "growth" . Moderately well managed, given patient history. Current regimen: dicyclomine 10 mg QID PRN, hyoscyamine 10 mg QID PRN . Notes that she needs her esophagus stretched, but is awaiting cardiology clearance to be able to hold clopidogrel. Has cardiology f/u upcoming.  Nocturnal "leg cramps", as well as chronic pain . More exacerbated recently per patient report. Current regimen: pregabalin 100 mg QPM PRN;  . Recommended to continue current regimen at this time. Monitor for increased sedation  COPD:  . Well controlled; Current regimen: Anoro 62.5/25 mcg  daily, albuterol HFA PRN, notes she has not required use recently . Continue current regimen at this time.   Patient Goals/Self-Care Activities . Over the next 90 days, patient will:  - take medications as prescribed check blood glucose TID using CGM, document, and provide at future appointments  Follow Up Plan: Telephone follow up appointment with care management team member scheduled for: ~ 8 weeks       Medication Assistance: None required.  Patient affirms current coverage meets needs.  Follow Up:  Patient agrees to Care Plan and Follow-up.  Plan: Telephone follow up appointment with care management team member scheduled for:  ~ 8 weeks  Catie Darnelle Maffucci, PharmD, [place], [place] Clinical Pharmacist [employer] at [employer] [phone]

## 2020-08-03 NOTE — Patient Instructions (Signed)
Visit Information  Patient Care Plan: Medication Management    Problem Identified: Diabetes, CHF     Long-Range Goal: Disease Progression Prevention   This Visit's Progress: On track  Recent Progress: On track  Priority: High  Note:   Current Barriers:  . Unable to achieve control of diabetes   Pharmacist Clinical Goal(s):  [name] Over the next 90 days, patient will achieve control of diabetes as evidenced by improvement in A1c through collaboration with PharmD and provider  Interventions: . 1:1 collaboration with [name], MD regarding development and update of comprehensive plan of care as evidenced by provider attestation and co-signature . Inter-disciplinary care team collaboration (see longitudinal plan of care) . Comprehensive medication review performed; medication list updated in electronic medical record  Health Maintenance: . Reviews the recent episode of ED visit for confusion. Reports continued headache. Neurology appointment had to be r/s due to inclement weather  . Reviewed need for COVID booster, Shingrix vaccination. Patient asked when to receive these. Recommended that she and husband prioritize COVID vaccination. Noted that there is no specific time required for separation from Shingrix, but as both COVID booster (and Shingrix second dose) are more likely to have short term side effects, she can separate by a few weeks.  . She expressed concerns regarding wanting to be active at church but being concerned about exposure, as not everyone at her church wears masks. Encouraged to weigh her personal health and safety highly and remain masked when around others.   Diabetes: . Uncontrolled; current treatment: Farxiga 10 mg daily, Lantus 10 units daily, Novolog up to 8 units up to TID with meals  o Hx intolerance to metformin d/t diarrhea o Hx Trulicity - exacerbated diarrhea  o Hx glipizide - stopped previously when A1c was well controlled  o Hx Januvia - stopped  previously when Trulicity was started  o Avoiding TZD due to HF . Current glucose readings:  Date of Download: 08/03/20 Average Glucose: ~124 mg/dL Time in Goal:  - Time in range 70-179: 87% - Time above range: 10 % - Time below range: 3 % - Denies any patterns of hypoglycemia. More so related to later meal times if she sleeps  . Received order for [facility] 2 from [place].  . Recommend to continue current regimen at this time. Patient notes that she would like to eventually reduce daily injection burden. Discussed that we could consider re-starting DPP4 (consider Tradjenta d/t lack of renal dose adjustments) to see if it would allow for reduction in mealtime insulin moving forward.  . Will collaborate w/ PCP to determine when she would like f/u and lab work for patient.  Hyperlipidemia: . Controlled per last lab; current treatment: atorvastatin 40 mg daily  . Recommended to continue current regimen at this time  Depression/Anxiety with Insomnia: . Moderately well managed. Wonder if recent ED episode for confusion was related to afternoon napping. Current treatment: citalopram 10 mg daily, bupropion 150 mg QAM, 75 mg QPM, diazepam 5 mg QPM, Lunesta 3 mg QPM . Continue to monitor for increased sedation w/ multiple CNS sedating medications. Neurology appointment upcoming to work up reason for episode of confusion earlier this month.  Chronic Diarrhea, S/p Gastric Bypass, Esophageal "growth" . Moderately well managed, given patient history. Current regimen: dicyclomine 10 mg QID PRN, hyoscyamine 10 mg QID PRN . Notes that she needs her esophagus stretched, but is awaiting cardiology clearance to be able to hold clopidogrel. Has cardiology f/u upcoming.  Nocturnal "leg cramps",  as well as chronic pain . More exacerbated recently per patient report. Current regimen: pregabalin 100 mg QPM PRN;  . Recommended to continue current regimen at this time. Monitor for increased sedation  COPD:   . Well controlled; Current regimen: Anoro 62.5/25 mcg daily, albuterol HFA PRN, notes she has not required use recently . Continue current regimen at this time.   Patient Goals/Self-Care Activities . Over the next 90 days, patient will:  - take medications as prescribed check blood glucose TID using CGM, document, and provide at future appointments  Follow Up Plan: Telephone follow up appointment with care management team member scheduled for: ~ 8 weeks       The patient verbalized understanding of instructions, educational materials, and care plan provided today and declined offer to receive copy of patient instructions, educational materials, and care plan.   Plan: Telephone follow up appointment with care management team member scheduled for:  ~ 8 weeks  Catie Darnelle Maffucci, PharmD, [place], [place] Clinical Pharmacist [employer] at [employer] [phone]

## 2020-08-09 ENCOUNTER — Other Ambulatory Visit: Payer: Self-pay | Admitting: Surgical

## 2020-08-09 DIAGNOSIS — Z9889 Other specified postprocedural states: Secondary | ICD-10-CM

## 2020-08-11 NOTE — Progress Notes (Signed)
HPI F never smoker followed for OSA, Hypersomnia, Insomnia complicated by IBS NPSG 7/78/24 at [facility]/ Sleep [facility]   Mild OSA, AHI 13.2/ hr, weight 218 lbs   CPAP 6 NPSG 05/22/15- CPAP Titration study- Optimal 7 cwp,  MSLT 05/23/15 ( she had taken Lyrica the night before during CPAP titration against protocol). 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 Walk Test 02/19/2019- Min sat 97%, Max HR 117. PFT 05/04/19- WNL FEV1/FVC 88%, TLC 96%, DLCO 85%. 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 Melvyn Novas for pulmonary, complicated by DM 2/peripheral neuropathy, degenerative disc disease, DVT 2017, CPAP 4-10/ [employer] Download compliance 100%, AHI 1.1/ hr Comfortable with CPAP. Complains that she feels more SOB, mainly DOE, every day since surgery Dec 1 to remove abdominal pannus. Physical activity very limited doe 2 months after surgery, little participation in PT/ rehab. Now can only walk short distances in home.  Denies Covid infection, blood clotting or bleeding or leg pain.  Would like to add a rescue inhaler. Some dry cough and wheeze.  Admits weight gain again since not active. Denies heart disease. Previous imaging in 2014 noted ground glass nodules, but these had resolve as of CT chest 2019.    Observations/Objective: PFT 05/04/2019- WNL    Assessment and Plan: OSA- benefits from CPA auto 4-10 with good compliance and control Dyspnea on Exertion- probably mostly obesity and deconditioning. Since she relates it to time of her surgery, we do want to make sure she has not had recurrent VTE.- Plan CTa to r/o PE Hx lung nodules 2014-  resolved, c/w benign inflammatory issue at that time.  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 Melvyn Novas for pulmonary, complicated by DM 2/peripheral neuropathy, degenerative disc disease, DVT 2017,IBS, , CHF, CAD/ stents, HTN, Iron Def Anemia, hx Bariatric Surgery, -Ipratropium 0.06% nasal, eszopiclone 3, Ventolin hfa, Anoro,  CPAP 4-12/ [employer]>> today change to 4-15 Download-compliance 100%, AHI 1/ hr Body weight today- 257 lbs covid vax- 2 Moderna Flu vax-had -----OSA, can't tell if cpap has turned off, or working correct, tired during day, doesn't feel she is getting enough sleep Some wheeze and cough. DOE 1-2 rooms.  Worse reflux past 2 weeks, pending GI w/u. Known stricture. Getting IV iron.  CPAP isnt cutting off automatically anymore. Notes some wheeze and cough with DOE walking 1-2 rooms in home. Reflux worse in last 2 weeks. Known stricture- pending GI w/u. CXR 12/30/19-  IMPRESSION: Mild bibasilar atelectasis with likely scarring in the lingula. No other acute cardiopulmonary disease.   ROS-see HPI   + = positive Constitutional:    weight loss, night sweats, fevers, chills,+ fatigue, lassitude. HEENT:   + headaches, +ifficulty swallowing, tooth/dental problems, sore throat,       sneezing, itching, ear ache, +nasal congestion, post nasal drip, snoring CV:    chest pain, orthopnea, PND, + swelling in lower extremities, anasarca,  dizziness, palpitations Resp:  +shortness of breath with exertion or at rest.                productive cough,   +non-productive cough, coughing up of blood.              change in color of mucus. + wheezing.   Skin:    rash or lesions. GI:  + IBS GU: dysuria, change in color of urine, no urgency or frequency.  MS:   +joint pain, stiffness, decreased range of  motion, back pain. Neuro-     nothing unusual Psych:  change in mood or affect.  depression or anxiety.   memory loss.  OBJ- Physical Exam General- Alert, Oriented, Affect-appropriate, Distress- none acute, + morbidly obese, wheelchair Skin- rash-none, lesions- none, excoriation- none. + Herpetic fever blister lip Lymphadenopathy- none Head- atraumatic            Eyes- Gross vision intact, PERRLA, conjunctivae and secretions clear            Ears- Hearing, canals-normal            Nose- Clear, no-Septal dev, mucus, polyps, erosion, perforation             Throat- Mallampati III , mucosa clear , drainage- none, tonsils- atrophic, + dentures Neck- flexible , trachea midline, no stridor , thyroid nl, carotid no bruit Chest - symmetrical excursion , unlabored           Heart/CV- RRR , no murmur , no gallop  , no rub, nl s1 s2                           - JVD- none , edema , stasis changes- none, varices- none           Lung- clear to P&A, wheeze- none, cough- none , dullness-none, rub- none, rales-none,                                Chest wall-  Abd-  Br/ Gen/ Rectal- Not done, not indicated Extrem- cyanosis- none, clubbing, none, atrophy- none, strength- nl, + cane Neuro- grossly intact to observation

## 2020-08-12 ENCOUNTER — Other Ambulatory Visit: Payer: Self-pay

## 2020-08-12 ENCOUNTER — Encounter: Payer: Self-pay | Admitting: Internal Medicine

## 2020-08-12 ENCOUNTER — Ambulatory Visit (INDEPENDENT_AMBULATORY_CARE_PROVIDER_SITE_OTHER): Payer: Medicare Other | Admitting: Internal Medicine

## 2020-08-12 VITALS — BP 130/78 | HR 71 | Temp 97.7°F | Ht 64.5 in | Wt 257.8 lb

## 2020-08-12 DIAGNOSIS — Z9989 Dependence on other enabling machines and devices: Secondary | ICD-10-CM | POA: Diagnosis not present

## 2020-08-12 DIAGNOSIS — G4733 Obstructive sleep apnea (adult) (pediatric): Secondary | ICD-10-CM

## 2020-08-12 DIAGNOSIS — R0600 Dyspnea, unspecified: Secondary | ICD-10-CM | POA: Diagnosis not present

## 2020-08-12 DIAGNOSIS — I25119 Atherosclerotic heart disease of native coronary artery with unspecified angina pectoris: Secondary | ICD-10-CM

## 2020-08-12 DIAGNOSIS — R0609 Other forms of dyspnea: Secondary | ICD-10-CM

## 2020-08-12 NOTE — Patient Instructions (Signed)
Order- DME [employer]- please increase CPAP range to auto 4-15. Please service or replace machine if eligible - she complains machine won't cut off automatically.  Please seek opportunities to walk more to help build stamina and control weight.  Please call if we can help

## 2020-08-19 ENCOUNTER — Ambulatory Visit (INDEPENDENT_AMBULATORY_CARE_PROVIDER_SITE_OTHER): Payer: Medicare Other | Admitting: Internal Medicine

## 2020-08-19 ENCOUNTER — Other Ambulatory Visit: Payer: Self-pay

## 2020-08-19 ENCOUNTER — Encounter: Payer: Self-pay | Admitting: Internal Medicine

## 2020-08-19 VITALS — BP 138/70 | HR 72 | Ht 64.5 in | Wt 260.1 lb

## 2020-08-19 DIAGNOSIS — E785 Hyperlipidemia, unspecified: Secondary | ICD-10-CM | POA: Diagnosis not present

## 2020-08-19 DIAGNOSIS — I10 Essential (primary) hypertension: Secondary | ICD-10-CM | POA: Diagnosis not present

## 2020-08-19 DIAGNOSIS — I5033 Acute on chronic diastolic (congestive) heart failure: Secondary | ICD-10-CM | POA: Diagnosis not present

## 2020-08-19 DIAGNOSIS — I2510 Atherosclerotic heart disease of native coronary artery without angina pectoris: Secondary | ICD-10-CM

## 2020-08-19 DIAGNOSIS — E1169 Type 2 diabetes mellitus with other specified complication: Secondary | ICD-10-CM | POA: Diagnosis not present

## 2020-08-19 DIAGNOSIS — I25119 Atherosclerotic heart disease of native coronary artery with unspecified angina pectoris: Secondary | ICD-10-CM

## 2020-08-19 MED ORDER — POTASSIUM CHLORIDE CRYS ER 20 MEQ PO TBCR: 20.0000 meq | EXTENDED_RELEASE_TABLET | Freq: Every day | ORAL | 5 refills | Status: DC

## 2020-08-19 MED ORDER — FUROSEMIDE 40 MG PO TABS: 40.0000 mg | ORAL_TABLET | Freq: Every day | ORAL | 5 refills | Status: DC

## 2020-08-19 NOTE — Patient Instructions (Signed)
Medication Instructions:  Your physician has recommended you make the following change in your medication:  1- START Furosemide 40 mg by mouth once a day. 2- START Potassium Chloride 20 mEq by mouth once a day.  *If you need a refill on your cardiac medications before your next appointment, please call your pharmacy*  Lab Work: none If you have labs (blood work) drawn today and your tests are completely normal, you will receive your results only by: [name] MyChart Message (if you have MyChart) OR . A paper copy in the mail If you have any lab test that is abnormal or we need to change your treatment, we will call you to review the results.   Testing/Procedures: none   Follow-Up: At [facility], you and your health needs are our priority.  As part of our continuing mission to provide you with exceptional heart care, we have created designated Provider Care Teams.  These Care Teams include your primary Cardiologist (physician) and Advanced Practice Providers (APPs -  Physician Assistants and Nurse Practitioners) who all work together to provide you with the care you need, when you need it.  We recommend signing up for the patient portal called "MyChart".  Sign up information is provided on this After Visit Summary.  MyChart is used to connect with patients for Virtual Visits (Telemedicine).  Patients are able to view lab/test results, encounter notes, upcoming appointments, etc.  Non-urgent messages can be sent to your provider as well.   To learn more about what you can do with MyChart, go to [email or web address].    Your next appointment:   2 week(s)  The format for your next appointment:   In Person  Provider:   You will see one of the following Advanced Practice Providers on your designated Care Team:    Murray Hodgkins, NP  Christell Faith, PA-C  Marrianne Mood, PA-C  Cadence Chipley, [place]  Laurann Montana, NP

## 2020-08-19 NOTE — Progress Notes (Signed)
Follow-up Outpatient Visit Date: 08/19/2020  Primary Care Provider: [name], MD [address] [place] [address]  Chief Complaint: Shortness of breath  HPI:  Denise Sharp is a 75 y.o. female with history of coronary artery disease status post recent PCI to the ostial and mid LAD in the setting of unstable angina (12/2019), hypertension, diabetes mellitus, iron deficiency anemia status post gastric bypass x2, irritable bowel syndrome, esophageal stenosis, chronic dizziness with gait instability, cervicogenic headache with cervical spine stenosis, obstructive sleep apnea on CPAP, restless leg syndrome, and adrenal mass, who presents for follow-up of coronary artery disease and chronic shortness of breath.  I last saw her in early November, at which time Denise Sharp reported continued significant shortness of breath unchanged following her PCI in the summer.  We considered adding standing diuretic therapy to treat a component of HFpEF, though we ultimately agreed to defer this given concerns about poor fluid intake and intermittent orthostatic lightheadedness.  No further testing was undertaken.  Today, Denise Sharp reports that she still feels very short of breath.  At times, she wonders if it could be due to esophageal problems, as she feels like she chokes on her own saliva.  She notes that her EGD has been on hold in the setting of PCI last summer and ongoing antiplatelet therapy.  She has experienced a few episodes of chest discomfort that she describes as "a heavy pain."  It has prompted her to take SL NTG four times over the last month.  The NTG help relieve the pain.  She also feels like she has been retaining more fluid, as her legs are swollen even when she gets up in the morning.  She has not had any dietary changes since our last visit.  She denies palpitations or  lightheadedness.  --------------------------------------------------------------------------------------------------  Cardiovascular Problems:  Intermittent left bundle branch block  Coronary artery disease status post PCI to ostial and mid LAD (12/2018)  Risk Factors:  Hypertension, hyperlipidemia, diabetes mellitus, obesity, sedentary lifestyle, and age greater than 48  Cath/PCI:  PCI (01/04/20): Successful PCI to the ostial and mid LAD using Resolute Onyx 3.5 x 12 mm and 2.75 x 12 mm drug-eluting stents, respectively.  LHC (12/31/19): LMCA normal. Sequential 80% ostial and mid LAD stenoses. No significant disease involving LCx and RCA  LHC (07/24/12, [facility]): LMCA normal. LAD, LCx, and RCA with minor luminal irregularities.  LHC (04/15/86, [facility]): Normal LMCA, LAD, LCx, and RCA. LVEF 65%.  CV Surgery:  None  EP Procedures and Devices:  None  Non-Invasive Evaluation(s):  TTE (12/30/2019): Normal LV size with LVEF 65-70% and grade 2 diastolic dysfunction. Normal RV size and function. No significant valvular abnormality.  Pharmacologic MPI (06/03/19): Low risk study without evidence of ischemia. Small fixed defect at the apex was felt to be most consistent with artifact and less likely scar.  Cardiac CT calcium score (09/03/16, Dr. Humphrey Rolls): Coronary calcium score 1548.  Pharmacologic myocardial perfusion stress test (08/08/16, Dr. Humphrey Rolls): "Equivocal stress test" with small inferior wall reversible defect and hyperdynamic LV contraction (EF 87%).  Transthoracic echocardiogram (08/06/16, Dr.Khan): Technically difficult study with mildly dilated left ventricle. LVEF was normal with normal wall motion. Grade 1 diastolic dysfunction. Mildly dilated RV with normal contraction. Mitral annular calcification present with mild MR. Mild PR and TR also present. Normal pulmonary artery pressure. Severe left and mild right atrial enlargement.  Pharmacologic myocardial perfusion stress test  (08/11/15, [facility]): Normal perfusion without ischemia or scar. LVEF 81%.  Recent CV Pertinent Labs: Lab Results  Component Value Date   CHOL 134 05/24/2020   CHOL 155 02/27/2014   HDL 60.40 05/24/2020   HDL 43 02/27/2014   LDLCALC 48 05/24/2020   LDLCALC 88 02/27/2014   LDLDIRECT 111.0 06/16/2015   TRIG 128.0 05/24/2020   TRIG 121 02/27/2014   CHOLHDL 2 05/24/2020   INR 1.1 12/31/2019   K 3.7 07/17/2020   K 4.0 06/21/2014   MG 1.5 (L) 10/22/2018   MG 1.5 (L) 08/05/2012   BUN 14 07/17/2020   BUN 11 10/22/2018   BUN 12 06/21/2014   CREATININE 1.03 (H) 07/17/2020   CREATININE 0.88 03/06/2019    Past medical and surgical history were reviewed and updated in EPIC.  Current Meds  Medication Sig  . albuterol (VENTOLIN HFA) 108 (90 Base) MCG/ACT inhaler Inhale 2 puffs into the lungs every 8 (eight) hours as needed for wheezing or shortness of breath.  [name] aspirin EC 81 MG tablet Take 1 tablet (81 mg total) by mouth daily.  [name] atorvastatin (LIPITOR) 40 MG tablet Take 1 tablet (40 mg total) by mouth daily.  [name] buPROPion (WELLBUTRIN) 75 MG tablet TAKE 2 TABLETS BY MOUTH EVERY MORNING AND 1 TABLET IN THE AFTERNOON  . butalbital-acetaminophen-caffeine (FIORICET) 50-325-40 MG tablet TAKE 1 TABLET BY MOUTH EVERY 6 HOURS AS NEEDED  . citalopram (CELEXA) 10 MG tablet Take 1 tablet (10 mg total) by mouth daily.  . clopidogrel (PLAVIX) 75 MG tablet Take 1 tablet (75 mg total) by mouth daily.  . clotrimazole-betamethasone (LOTRISONE) cream Apply 1 application topically 2 (two) times daily. (Patient taking differently: Apply 1 application topically 2 (two) times daily as needed (rash).)  . Continuous Blood Gluc Receiver (FREESTYLE LIBRE 2 READER) DEVI Use to check sugar at least TID  . Continuous Blood Gluc Sensor (FREESTYLE LIBRE 14 DAY SENSOR) MISC Use to check sugar at least 3 times daily  . cyanocobalamin (,VITAMIN B-12,) 1000 MCG/ML injection Inject 1 mL (1,000 mcg total) into the muscle every  30 (thirty) days. Inject 1 ml (1000 mcg ) IM weekly x 4,  Then monthly thereafter  . dapagliflozin propanediol (FARXIGA) 10 MG TABS tablet Take 1 tablet (10 mg total) by mouth daily before breakfast.  . diazepam (VALIUM) 5 MG tablet Take 1 tablet (5 mg total) by mouth at bedtime as needed for anxiety.  . dicyclomine (BENTYL) 10 MG capsule Take 1 capsule (10 mg total) by mouth 4 (four) times daily -  before meals and at bedtime.  . diphenhydrAMINE (BENADRYL) 25 MG tablet Take 50 mg by mouth daily as needed for allergies.   . diphenoxylate-atropine (LOMOTIL) 2.5-0.025 MG tablet Take 1 tablet by mouth 4 (four) times daily as needed for diarrhea or loose stools.  . Eszopiclone 3 MG TABS Take 1 tablet (3 mg total) by mouth at bedtime.  . hyoscyamine (ANASPAZ) 0.125 MG TBDP disintergrating tablet DISSOLVE UNDER TONGUE 15 MINUTES BEFORE MEALS  . insulin aspart (NOVOLOG FLEXPEN) 100 UNIT/ML FlexPen Inject 8 units up to 3 times daily before meals  . Insulin Glargine (LANTUS SOLOSTAR) 100 UNIT/ML Solostar Pen Inject 11 Units into the skin daily. (Patient taking differently: Inject 10-15 Units into the skin daily.)  . Insulin Pen Needle (DROPLET PEN NEEDLES) 31G X 5 MM MISC USE ONE NEEDLE SUBCUTANEOUSLY AS DIRECTED (REMOVE AND DISCARD NEEDLE IN SHARPS CONTAINER IMMEDIATELY AFTER USE)  . Insulin Syringe-Needle U-100 (INSULIN SYRINGE 1CC/30GX5/16") 30G X 5/16" 1 ML MISC 1 Stick by Does not apply route  3 (three) times daily as needed. [id number]  . ipratropium (ATROVENT) 0.06 % nasal spray Place 2 sprays into both nostrils 4 (four) times daily.  [name] loperamide (IMODIUM) 2 MG capsule Take by mouth.  . nitroGLYCERIN (NITROSTAT) 0.4 MG SL tablet Place 1 tablet (0.4 mg total) under the tongue every 5 (five) minutes as needed for chest pain.  . NONFORMULARY OR COMPOUNDED Burns  Combination Pain Cream -  Baclofen 2%, Doxepin 5%, Gabapentin 6%, Topiramate 2%, Pentoxifylline 3% Apply 1-2 grams to affected  area 3-4 times daily Qty. 120 gm 3 refills  . nystatin (MYCOSTATIN/NYSTOP) powder Apply 1 g topically 4 (four) times daily as needed (rash).   . pregabalin (LYRICA) 100 MG capsule Take 100 mg by mouth at bedtime as needed (leg cramps).   . traMADol (ULTRAM) 50 MG tablet Take 0.5 tablets (25 mg total) by mouth every 12 (twelve) hours as needed for severe pain.  [name] umeclidinium-vilanterol (ANORO ELLIPTA) 62.5-25 MCG/INH AEPB Inhale 1 puff into the lungs daily.    Allergies: Demeclocycline, Erythromycin, Flagyl [metronidazole], Glucophage [metformin hcl], Tetracyclines & related, Diovan [valsartan], Sulfa antibiotics, and Xanax [alprazolam]  Social History   Tobacco Use  . Smoking status: Never Smoker  . Smokeless tobacco: Never Used  Vaping Use  . Vaping Use: Never used  Substance Use Topics  . Alcohol use: No  . Drug use: No    Family History  Problem Relation Age of Onset  . Heart disease Father   . Hypertension Father   . Prostate cancer Father   . Stroke Father   . Osteoporosis Father   . Stroke Mother   . Depression Mother   . Headache Mother   . Heart disease Mother   . Thyroid disease Mother   . Hypertension Mother   . Diabetes Daughter   . Heart disease Daughter   . Hypertension Daughter   . Hypertension Son     Review of Systems: Denise Sharp struck her left great toe on the door recently and noted prolonged bleeding.  It is now covered with a scab and has not been hurting.  Otherwise, a 12-system review of systems was performed and was negative except as noted in the HPI.  --------------------------------------------------------------------------------------------------  Physical Exam: BP 138/70 (BP Location: Left Arm, Patient Position: Sitting, Cuff Size: Large)   Pulse 72   Ht 5' 4.5" (1.638 m)   Wt 260 lb 2 oz (118 kg)   SpO2 98%   BMI 43.96 kg/m   General:  NAD, seated in a wheelchair.  She is accompanied by her husband. Neck: Unable to assess JVP  or HJR due to body habitus. Lungs: Mildly diminished breath sounds throughout without wheezes or crackles. Heart: Distant heart sounds.  Regular rate and rhythm without murmurs. Abdomen: Soft, nontender, nondistended. Extremities: Trace calf edema bilaterally.  Left great toe with small eschar.  No surrounding erythema.  1+ left PT/DP pulses.  EKG:  NSR with low voltage.  Otherwise, no significant abnormality or change from prior tracing on 03/23/2020.  Lab Results  Component Value Date   WBC 10.5 07/17/2020   HGB 11.4 (L) 07/17/2020   HCT 38.6 07/17/2020   MCV 83.9 07/17/2020   PLT 354 07/17/2020    Lab Results  Component Value Date   NA 140 07/17/2020   K 3.7 07/17/2020   CL 106 07/17/2020   CO2 20 (L) 07/17/2020   BUN 14 07/17/2020   CREATININE 1.03 (H) 07/17/2020   GLUCOSE 117 (H) 07/17/2020  ALT 12 07/17/2020    Lab Results  Component Value Date   CHOL 134 05/24/2020   HDL 60.40 05/24/2020   LDLCALC 48 05/24/2020   LDLDIRECT 111.0 06/16/2015   TRIG 128.0 05/24/2020   CHOLHDL 2 05/24/2020    --------------------------------------------------------------------------------------------------  ASSESSMENT AND PLAN: Coronary artery disease with chest pain: Denise Sharp reports "heavy pain" in the chest intermittently over the last month.  It has not clearly been exertional but improved with SL NTG.  Prior MPI's have been normal, though cath last summer showed severe multifocal LAD disease that was treated with 2 non-overlapping DES.  Denise Sharp also complains of HFpEF sx (chronic dyspnea and worsening edema) that could be contributing as well.  We have agreed to a trial of furosemide 40 mg daily and KCl 20 mEq daily.  If her symptoms do not improve, we will need to consider moving forward with [facility].  We will continue DAPT with aspirin and clopidogrel for now.  Defer clearance for EGD pending reevaluation in the office in ~2 weeks.  Acute on chronic HFpEF: Volume exam  is challenging due to morbid obesity.  Denise Sharp reports continued NYHA class III symptoms and worsening edema.  As above, we will add furosemide 40 mg daily.  Hypertension: BP mildly elevated.  We will add furosemide but otherwise continue current medications.  Hyperlipidemia associated with type 2 diabetes mellitus: LDL well-controlled on last check in 05/2020.  Continue atorvastatin 40 mg daily.  Morbid obesity: BMI > 40 with multiple comorbidities.  Weight loss encouraged.  Follow-up: RTC in 2 weeks with BMP at that time.  [name], MD 08/19/2020 2:24 PM

## 2020-08-21 ENCOUNTER — Encounter: Payer: Self-pay | Admitting: Internal Medicine

## 2020-08-21 DIAGNOSIS — E1169 Type 2 diabetes mellitus with other specified complication: Secondary | ICD-10-CM | POA: Insufficient documentation

## 2020-08-21 DIAGNOSIS — E785 Hyperlipidemia, unspecified: Secondary | ICD-10-CM | POA: Insufficient documentation

## 2020-08-31 ENCOUNTER — Other Ambulatory Visit: Payer: Self-pay

## 2020-08-31 ENCOUNTER — Inpatient Hospital Stay: Payer: Medicare Other | Attending: Internal Medicine

## 2020-08-31 ENCOUNTER — Inpatient Hospital Stay (HOSPITAL_BASED_OUTPATIENT_CLINIC_OR_DEPARTMENT_OTHER): Payer: Medicare Other | Admitting: Internal Medicine

## 2020-08-31 ENCOUNTER — Inpatient Hospital Stay: Payer: Medicare Other

## 2020-08-31 DIAGNOSIS — R5383 Other fatigue: Secondary | ICD-10-CM | POA: Diagnosis not present

## 2020-08-31 DIAGNOSIS — F329 Major depressive disorder, single episode, unspecified: Secondary | ICD-10-CM | POA: Diagnosis not present

## 2020-08-31 DIAGNOSIS — E611 Iron deficiency: Secondary | ICD-10-CM

## 2020-08-31 DIAGNOSIS — Z79899 Other long term (current) drug therapy: Secondary | ICD-10-CM | POA: Insufficient documentation

## 2020-08-31 DIAGNOSIS — Z794 Long term (current) use of insulin: Secondary | ICD-10-CM | POA: Insufficient documentation

## 2020-08-31 DIAGNOSIS — D509 Iron deficiency anemia, unspecified: Secondary | ICD-10-CM | POA: Diagnosis not present

## 2020-08-31 DIAGNOSIS — I10 Essential (primary) hypertension: Secondary | ICD-10-CM | POA: Diagnosis not present

## 2020-08-31 DIAGNOSIS — I25119 Atherosclerotic heart disease of native coronary artery with unspecified angina pectoris: Secondary | ICD-10-CM | POA: Diagnosis not present

## 2020-08-31 DIAGNOSIS — E669 Obesity, unspecified: Secondary | ICD-10-CM | POA: Diagnosis not present

## 2020-08-31 DIAGNOSIS — Z7982 Long term (current) use of aspirin: Secondary | ICD-10-CM | POA: Insufficient documentation

## 2020-08-31 DIAGNOSIS — G4730 Sleep apnea, unspecified: Secondary | ICD-10-CM | POA: Diagnosis not present

## 2020-08-31 DIAGNOSIS — Z9884 Bariatric surgery status: Secondary | ICD-10-CM | POA: Diagnosis not present

## 2020-08-31 DIAGNOSIS — K589 Irritable bowel syndrome without diarrhea: Secondary | ICD-10-CM | POA: Diagnosis not present

## 2020-08-31 DIAGNOSIS — E538 Deficiency of other specified B group vitamins: Secondary | ICD-10-CM | POA: Diagnosis not present

## 2020-08-31 DIAGNOSIS — G629 Polyneuropathy, unspecified: Secondary | ICD-10-CM | POA: Diagnosis not present

## 2020-08-31 DIAGNOSIS — M1990 Unspecified osteoarthritis, unspecified site: Secondary | ICD-10-CM | POA: Insufficient documentation

## 2020-08-31 LAB — CBC WITH DIFFERENTIAL/PLATELET
Abs Immature Granulocytes: 0.07 10*3/uL (ref 0.00–0.07)
Basophils Absolute: 0.1 10*3/uL (ref 0.0–0.1)
Basophils Relative: 1 %
Eosinophils Absolute: 0.2 10*3/uL (ref 0.0–0.5)
Eosinophils Relative: 2 %
HCT: 41.2 % (ref 36.0–46.0)
Hemoglobin: 13.7 g/dL (ref 12.0–15.0)
Immature Granulocytes: 1 %
Lymphocytes Relative: 23 %
Lymphs Abs: 2.3 10*3/uL (ref 0.7–4.0)
MCH: 30.5 pg (ref 26.0–34.0)
MCHC: 33.3 g/dL (ref 30.0–36.0)
MCV: 91.8 fL (ref 80.0–100.0)
Monocytes Absolute: 0.6 10*3/uL (ref 0.1–1.0)
Monocytes Relative: 6 %
Neutro Abs: 6.6 10*3/uL (ref 1.7–7.7)
Neutrophils Relative %: 67 %
Platelets: 277 10*3/uL (ref 150–400)
RBC: 4.49 MIL/uL (ref 3.87–5.11)
RDW: 23.1 % — ABNORMAL HIGH (ref 11.5–15.5)
WBC: 9.7 10*3/uL (ref 4.0–10.5)
nRBC: 0 % (ref 0.0–0.2)

## 2020-08-31 LAB — BASIC METABOLIC PANEL
Anion gap: 12 (ref 5–15)
BUN: 15 mg/dL (ref 8–23)
CO2: 22 mmol/L (ref 22–32)
Calcium: 8.8 mg/dL — ABNORMAL LOW (ref 8.9–10.3)
Chloride: 106 mmol/L (ref 98–111)
Creatinine, Ser: 1.13 mg/dL — ABNORMAL HIGH (ref 0.44–1.00)
GFR, Estimated: 51 mL/min — ABNORMAL LOW (ref >60)
Glucose, Bld: 188 mg/dL — ABNORMAL HIGH (ref 70–99)
Potassium: 4.1 mmol/L (ref 3.5–5.1)
Sodium: 140 mmol/L (ref 135–145)

## 2020-08-31 LAB — LACTATE DEHYDROGENASE: LDH: 139 U/L (ref 98–192)

## 2020-08-31 NOTE — Progress Notes (Signed)
**Note Denise-Identified via Obfuscation** Denise Sharp NOTE  Patient Care Team: Denise Mc, MD as PCP - General (Internal Medicine) Denise Sharp, Denise Gave, MD as PCP - Cardiology (Cardiology) Chiropractic, Denise Sharp as Referring Physician Denise Sharp, Denise Sharp (Pharmacist)  CHIEF COMPLAINTS/PURPOSE OF CONSULTATION:    HEMATOLOGY HISTORY  # IRON DEFICIENT ANEMIA Denise Sharp TO [place]; 2018 IV iron infusion [Denise Sharp] EGD/colonoscopy- ?2021 [Denise Sharp]; NOV 2021-hemoglobin 10.  Iron saturation 5% ferritin 12.  # GASTRIC BYPASS x2 [9678]; CAD [s/p stent; Denise Sharp]  HISTORY OF PRESENTING ILLNESS:  Denise Sharp 75 y.o.  female iron deficiency secondary gastric bypass is here for follow-up.  Patient has been recently evaluated in the hospital with a CT scan of the head for episode of confusion.  She also has been evaluated cardiology for worsening fatigue-on Lasix/potassium supplementation.  No significant improvement of fatigue noted.  As per patient consideration of cardiac cath.  Also has episodes of dysphagia noted to have a stricture awaiting evaluation with GI.   Review of Systems  Constitutional: Positive for malaise/fatigue. Negative for chills, diaphoresis, fever and weight loss.  HENT: Negative for nosebleeds and sore throat.   Eyes: Negative for double vision.  Respiratory: Negative for cough, hemoptysis, sputum production, shortness of breath and wheezing.   Cardiovascular: Negative for chest pain, palpitations, orthopnea and leg swelling.  Gastrointestinal: Positive for diarrhea. Negative for abdominal pain, blood in stool, constipation, heartburn, melena, nausea and vomiting.  Genitourinary: Negative for dysuria, frequency and urgency.  Musculoskeletal: Positive for back pain. Negative for joint pain.  Skin: Negative.  Negative for itching and rash.  Neurological: Negative for dizziness, tingling, focal weakness, weakness and headaches.  Endo/Heme/Allergies: Does not bruise/bleed  easily.  Psychiatric/Behavioral: Negative for depression. The patient is not nervous/anxious and does not have insomnia.     MEDICAL HISTORY:  Past Medical History:  Diagnosis Date  . Abnormality of gait 03/25/2013  . Adrenal mass, left (Denise Sharp)   . Anemia    iron deficient post  2 unit txfsn 2009, normal endo/colonoscopy by Denise Sharp  . Arthritis   . Atypical chest pain    a. 04/1986 Cath ([facility]): nl cors, EF 65%; b. 07/2012 Cath [facility]): LM nl, LAD/LCX/RCA w/ min irregs; c. 07/2016 MV Denise Sharp): "Equivocal" w/ small inf wall rev defect and hyperdynamic LV contraction, EF 87%; d. 08/2016 Cardiac CT Ca2+ score Denise Sharp): Ca2+ score 1548; e. 05/2019 MV: No ischemia. Diaph atten. EF 75%.  . Cervical spinal stenosis 1994   due to trauma to back ([employer] accident), has intermittent paralysis and parasthesias  . Cervicogenic headache 03/23/2014  . Depression   . Diastolic dysfunction    a. 12/2019 Echo: EF 65-70%, Gr2 DD. No significant valvular dzs.  . Dizziness    chronic dizziness  . DJD (degenerative joint disease)    a. Chronic R shoulder pain pending R shoulder replacement 07/2017.  [name] Esophageal stenosis March 2011   with transietn outlet obstruction by food, cleared by EGD   . Family history of adverse reaction to anesthesia    daughter Denise Sharp  . Gastric bypass status for obesity   . Headache(784.0)   . Hypertension   . IBS (irritable bowel syndrome)   . Left bundle branch block (LBBB)    a. Intermittently present - likely rate related. - patient denies  . Obesity   . Obstructive sleep apnea    using CPAP  . 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 Restless leg syndrome   . Rotator cuff arthropathy, right 08/13/2017  . Syncope and  collapse 03/12/2014  . Type II diabetes mellitus (Denise Sharp)     SURGICAL HISTORY: Past Surgical History:  Procedure Laterality Date  . ABDOMINAL HYSTERECTOMY    . APPENDECTOMY    . COLONOSCOPY    . CORONARY STENT INTERVENTION N/A 01/04/2020   Procedure: CORONARY  STENT INTERVENTION;  Surgeon: Denise Hampshire, MD;  Location: [facility];  Service: Cardiovascular;  Laterality: N/A;  LAD   . DIAPHRAGMATIC HERNIA REPAIR  2015  . ESOPHAGEAL DILATION     multiple  . ESOPHAGOGASTRODUODENOSCOPY    . [place]  . GALLBLADDER SURGERY  resection  . GASTRIC BYPASS    . GASTRIC BYPASS  2000, 2005   Dr. Debroah Sharp  . [facility] IMPLANT PLACEMENT  Denise Sharp   Denise Sharp  . JOINT REPLACEMENT  2007   bilateral knee. [name],  Sharp  . LEFT HEART CATH AND CORS/GRAFTS ANGIOGRAPHY N/A 12/31/2019   Procedure: LEFT HEART CATH AND CORS/GRAFTS ANGIOGRAPHY;  Surgeon: Denise Merritts, MD;  Location: Denise Sharp;  Service: Cardiovascular;  Laterality: N/A;  . PANNICULECTOMY  06/16/2019  . PANNICULECTOMY N/A 06/16/2019   Procedure: PANNICULECTOMY;  Surgeon: Denise Presume, MD;  Location: Denise Sharp;  Service: Plastics;  Laterality: N/A;  3 hours, please  . REVERSE SHOULDER ARTHROPLASTY Right 08/13/2017   Procedure: REVERSE RIGHT SHOULDER ARTHROPLASTY;  Surgeon: Denise Bond, MD;  Location: Denise Sharp;  Service: Orthopedics;  Laterality: Right;  . ROTATOR CUFF REPAIR     right  . [place]  . TOTAL ABDOMINAL HYSTERECTOMY W/ BILATERAL SALPINGOOPHORECTOMY  1974  . UMBILICAL HERNIA REPAIR  Aug 11, 2015    SOCIAL HISTORY: Social History   Socioeconomic History  . Marital status: Married    Spouse name: Denise Sharp   . Number of children: 2  . Years of education: College   . Highest education level: Not on file  Occupational History    Employer: retired  Tobacco Use  . Smoking status: Never Smoker  . Smokeless tobacco: Never Used  Vaping Use  . Vaping Use: Never used  Substance and Sexual Activity  . Alcohol use: No  . Drug use: No  . Sexual activity: Not Currently    Birth control/protection: Post-menopausal, Surgical  Other Topics Concern  . Not on file  Social History Narrative   Patient lives at home with husband Denise Sharp.  Patient has 2 children. Patient has [employer]. Patient is on disability; retd RN; Patient is right handed.    Social Determinants of Health   Financial Resource Strain: Low Risk   . Difficulty of Paying Living Expenses: Not hard at all  Food Insecurity: Not on file  Transportation Needs: No Transportation Needs  . Lack of Transportation (Medical): No  . Lack of Transportation (Non-Medical): No  Physical Activity: Not on file  Stress: Stress Concern Present  . Feeling of Stress : Rather much  Social Connections: Not on file  Intimate Partner Violence: Not on file    FAMILY HISTORY: Family History  Problem Relation Age of Onset  . Heart disease Father   . Hypertension Father   . Prostate cancer Father   . Stroke Father   . Osteoporosis Father   . Stroke Mother   . Depression Mother   . Headache Mother   . Heart disease Mother   . Thyroid disease Mother   . Hypertension Mother   . Diabetes Daughter   . Heart disease Daughter   . Hypertension  Daughter   . Hypertension Son     ALLERGIES:  is allergic to demeclocycline, erythromycin, flagyl [metronidazole], glucophage [metformin hcl], tetracyclines & related, diovan [valsartan], sulfa antibiotics, and xanax [alprazolam].  MEDICATIONS:  Current Outpatient Medications  Medication Sig Dispense Refill  . albuterol (VENTOLIN HFA) 108 (90 Base) MCG/ACT inhaler Inhale 2 puffs into the lungs every 8 (eight) hours as needed for wheezing or shortness of breath. 8 g 2  . aspirin EC 81 MG tablet Take 1 tablet (81 mg total) by mouth daily. 30 tablet 2  . atorvastatin (LIPITOR) 40 MG tablet Take 1 tablet (40 mg total) by mouth daily. 90 tablet 0  . buPROPion (WELLBUTRIN) 75 MG tablet TAKE 2 TABLETS BY MOUTH EVERY MORNING AND 1 TABLET IN THE AFTERNOON 270 tablet 1  . butalbital-acetaminophen-caffeine (FIORICET) 50-325-40 MG tablet TAKE 1 TABLET BY MOUTH EVERY 6 HOURS AS NEEDED 60 tablet 5  . citalopram (CELEXA) 10 MG tablet Take 1  tablet (10 mg total) by mouth daily. 90 tablet 1  . clopidogrel (PLAVIX) 75 MG tablet Take 1 tablet (75 mg total) by mouth daily. 90 tablet 0  . clotrimazole-betamethasone (LOTRISONE) cream Apply 1 application topically 2 (two) times daily. (Patient taking differently: Apply 1 application topically 2 (two) times daily as needed (rash).) 45 g 3  . Continuous Blood Gluc Receiver (FREESTYLE LIBRE 2 READER) DEVI Use to check sugar at least TID 1 each 1  . Continuous Blood Gluc Sensor (FREESTYLE LIBRE 14 DAY SENSOR) MISC Use to check sugar at least 3 times daily 6 each 3  . cyanocobalamin (,VITAMIN B-12,) 1000 MCG/ML injection Inject 1 mL (1,000 mcg total) into the muscle every 30 (thirty) days. Inject 1 ml (1000 mcg ) IM weekly x 4,  Then monthly thereafter 3 mL 1  . dapagliflozin propanediol (FARXIGA) 10 MG TABS tablet Take 1 tablet (10 mg total) by mouth daily before breakfast. 90 tablet 3  . diazepam (VALIUM) 5 MG tablet Take 1 tablet (5 mg total) by mouth at bedtime as needed for anxiety. 30 tablet 5  . dicyclomine (BENTYL) 10 MG capsule Take 1 capsule (10 mg total) by mouth 4 (four) times daily -  before meals and at bedtime. 360 capsule 3  . diphenhydrAMINE (BENADRYL) 25 MG tablet Take 50 mg by mouth daily as needed for allergies.     . diphenoxylate-atropine (LOMOTIL) 2.5-0.025 MG tablet Take 1 tablet by mouth 4 (four) times daily as needed for diarrhea or loose stools. 60 tablet 5  . Eszopiclone 3 MG TABS Take 1 tablet (3 mg total) by mouth at bedtime. 90 tablet 1  . furosemide (LASIX) 40 MG tablet Take 1 tablet (40 mg total) by mouth daily. 30 tablet 5  . hyoscyamine (ANASPAZ) 0.125 MG TBDP disintergrating tablet DISSOLVE UNDER TONGUE 15 MINUTES BEFORE MEALS 360 tablet 1  . insulin aspart (NOVOLOG FLEXPEN) 100 UNIT/ML FlexPen Inject 8 units up to 3 times daily before meals 15 mL 11  . Insulin Glargine (LANTUS SOLOSTAR) 100 UNIT/ML Solostar Pen Inject 11 Units into the skin daily. (Patient taking  differently: Inject 10-15 Units into the skin daily.) 5 pen 3  . Insulin Pen Needle (DROPLET PEN NEEDLES) 31G X 5 MM MISC USE ONE NEEDLE SUBCUTANEOUSLY AS DIRECTED (REMOVE AND DISCARD NEEDLE IN SHARPS CONTAINER IMMEDIATELY AFTER USE) 100 each 1  . Insulin Syringe-Needle U-100 (INSULIN SYRINGE 1CC/30GX5/16") 30G X 5/16" 1 ML MISC 1 Stick by Does not apply route 3 (three) times daily as needed. DX.E11.65 100  each 0  . ipratropium (ATROVENT) 0.06 % nasal spray Place 2 sprays into both nostrils 4 (four) times daily. 15 mL 12  . loperamide (IMODIUM) 2 MG capsule Take by mouth.    . nitroGLYCERIN (NITROSTAT) 0.4 MG SL tablet Place 1 tablet (0.4 mg total) under the tongue every 5 (five) minutes as needed for chest pain. 20 tablet 1  . NONFORMULARY OR COMPOUNDED [place]  Combination Pain Cream -  Baclofen 2%, Doxepin 5%, Gabapentin 6%, Topiramate 2%, Pentoxifylline 3% Apply 1-2 grams to affected area 3-4 times daily Qty. 120 gm 3 refills 1 each 3  . nystatin (MYCOSTATIN/NYSTOP) powder Apply 1 g topically 4 (four) times daily as needed (rash).     . potassium chloride SA (KLOR-CON) 20 MEQ tablet Take 1 tablet (20 mEq total) by mouth daily. 30 tablet 5  . pregabalin (LYRICA) 100 MG capsule Take 100 mg by mouth at bedtime as needed (leg cramps).     . traMADol (ULTRAM) 50 MG tablet Take 0.5 tablets (25 mg total) by mouth every 12 (twelve) hours as needed for severe pain. 30 tablet 0  . umeclidinium-vilanterol (ANORO ELLIPTA) 62.5-25 MCG/INH AEPB Inhale 1 puff into the lungs daily. 60 each 3   No current facility-administered medications for this visit.      PHYSICAL EXAMINATION:   Vitals:   08/31/20 1331  BP: 130/72  Pulse: 83  Resp: 20  Temp: 98.5 F (36.9 C)  SpO2: 97%   Filed Weights   08/31/20 1331  Weight: 253 lb (114.8 kg)    Physical Exam Constitutional:      Comments: Alone; in a wheelchair.  Obese.  HENT:     Head: Normocephalic and atraumatic.      Mouth/Throat:     Pharynx: No oropharyngeal exudate.  Eyes:     Pupils: Pupils are equal, round, and reactive to light.  Cardiovascular:     Rate and Rhythm: Normal rate and regular rhythm.     Pulses: Normal pulses.     Heart sounds: Normal heart sounds.  Pulmonary:     Effort: Pulmonary effort is normal. No respiratory distress.     Breath sounds: Normal breath sounds. No wheezing.  Abdominal:     General: Bowel sounds are normal. There is no distension.     Palpations: Abdomen is soft. There is no mass.     Tenderness: There is no abdominal tenderness. There is no guarding or rebound.  Musculoskeletal:        General: No tenderness. Normal range of motion.     Cervical back: Normal range of motion and neck supple.  Skin:    General: Skin is warm.  Neurological:     Mental Status: She is alert and oriented to person, place, and time.  Psychiatric:        Mood and Affect: Affect normal.     LABORATORY DATA:  I have reviewed the data as listed Sharp Results  Component Value Date   WBC 9.7 08/31/2020   HGB 13.7 08/31/2020   HCT 41.2 08/31/2020   MCV 91.8 08/31/2020   PLT 277 08/31/2020   Recent Labs    01/05/20 0656 01/13/20 1630 01/25/20 1203 03/23/20 1204 05/24/20 1532 06/07/20 1546 07/17/20 1150 08/31/20 1307  NA 140   < > 139 140 140  --  140 140  K 4.1   < > 3.9 4.3 3.7  --  3.7 4.1  CL 107   < > 107 103 102  --  106 106  CO2 25   < > 20* 26 24  --  20* 22  GLUCOSE 146*   < > 187* 165* 135*  --  117* 188*  BUN 14   < > 17 10 9   --  14 15  CREATININE 1.20*   < > 1.11* 1.05* 1.20  --  1.03* 1.13*  CALCIUM 8.3*   < > 8.8* 8.9 8.4  --  9.1 8.8*  GFRNONAA 44*  --  49* 52*  --   --  57* 51*  GFRAA 52*  --  57* >60  --   --   --   --   PROT  --   --   --   --  6.8 6.9 7.6  --   ALBUMIN  --   --   --   --  3.6 3.6 3.5  --   AST  --   --   --   --  12 12 18   --   ALT  --   --   --   --  10 9 12   --   ALKPHOS  --   --   --   --  136* 153*  134* 129*  --    BILITOT  --   --   --   --  0.3 0.3 0.5  --   BILIDIR  --   --   --   --   --  0.1  --   --    < > = values in this interval not displayed.     No results found.  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  S/p Feraheme infusion hemoglobin today is 13.  Hold Feraheme.  # B12 deficiency secondary gastric bypass at home iron injections.  # Fatigue: ? Cardiac [DeniseEnd]- on Lasix/ Kdur; ? Cardiac cath.  # Esophagus: dysphagia ? stricture- Awaiting EGD with Denise Sharp.  Esophagogram not very concerning for malignancy.  # DISPOSITION: # NO ferrahem today # follow up in 4 months; MD;labs- cbc/bmp; iron studies/ferritin; ldh; possible Ferrahem- DeniseB  All questions were answered. The patient knows to call the clinic with any problems, questions or concerns.    Cammie Sickle, MD 08/31/2020 2:21 PM

## 2020-08-31 NOTE — Progress Notes (Signed)
Has concerns of low energy since heart surgery in June.

## 2020-08-31 NOTE — Assessment & Plan Note (Addendum)
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  S/p Feraheme infusion hemoglobin today is 13.  Hold Feraheme.  # B12 deficiency secondary gastric bypass at home iron injections.  # Fatigue: ? Cardiac [Dr.End]- on Lasix/ Kdur; ? Cardiac cath.  # Esophagus: dysphagia ? stricture- Awaiting EGD with Dr.Wohl.  Esophagogram not very concerning for malignancy.  # DISPOSITION: # NO ferrahem today # follow up in 4 months; MD;labs- cbc/bmp; iron studies/ferritin; ldh; possible Ferrahem- Dr.B

## 2020-09-02 ENCOUNTER — Ambulatory Visit (INDEPENDENT_AMBULATORY_CARE_PROVIDER_SITE_OTHER): Payer: Medicare Other | Admitting: Medical

## 2020-09-02 ENCOUNTER — Other Ambulatory Visit: Payer: Self-pay

## 2020-09-02 ENCOUNTER — Encounter: Payer: Self-pay | Admitting: Physician Assistant

## 2020-09-02 VITALS — BP 120/80 | HR 88 | Ht 64.5 in | Wt 251.0 lb

## 2020-09-02 DIAGNOSIS — R0600 Dyspnea, unspecified: Secondary | ICD-10-CM | POA: Diagnosis not present

## 2020-09-02 DIAGNOSIS — I5032 Chronic diastolic (congestive) heart failure: Secondary | ICD-10-CM

## 2020-09-02 DIAGNOSIS — E785 Hyperlipidemia, unspecified: Secondary | ICD-10-CM

## 2020-09-02 DIAGNOSIS — I25119 Atherosclerotic heart disease of native coronary artery with unspecified angina pectoris: Secondary | ICD-10-CM

## 2020-09-02 DIAGNOSIS — R079 Chest pain, unspecified: Secondary | ICD-10-CM

## 2020-09-02 DIAGNOSIS — R0609 Other forms of dyspnea: Secondary | ICD-10-CM

## 2020-09-02 DIAGNOSIS — I10 Essential (primary) hypertension: Secondary | ICD-10-CM

## 2020-09-02 NOTE — Patient Instructions (Addendum)
Medication Instructions:   NONE  Labwork:  NONE  Testing/Procedures:    [address], [place] [address] Dept: [phone] Loc: [phone]  Denise Sharp  09/02/2020  You are scheduled for a Cardiac Catheterization on Tuesday, February 22 with Dr. Harrell Gave End.  1. Please arrive at the [facility] medical mall (Main Entrance A) at 10:30 AM (This time is one hour before your procedure to ensure your preparation). Free valet parking service is available.   Special note: Every effort is made to have your procedure done on time. Please understand that emergencies sometimes delay scheduled procedures.  2. Diet: Do not eat solid foods after midnight.  The patient may have clear liquids until 5am upon the day of the procedure.  3. COVID PRE- TEST: You will need a COVID TEST prior to the procedure:  LOCATION: [place] [facility]Thru Testing site.  DATE/TIME:  Today after visit  And call number on sign  4. Medication instructions in preparation for your procedure:   Contrast Allergy: No   Takes 1/2 dose of Insulin the evening before your procedure and hold insulin day of procedure. Please hold oral diabetic medications day of procedure.  On the morning of your procedure, take your Aspirin and Plavix/Clopidogrel and any morning medicines NOT listed above.  You may use sips of water.  Hold Furosemide 40 mg the day of Procedure.  5. Plan for one night stay--bring personal belongings. 6. Bring a current list of your medications and current insurance cards. 7. You MUST have a responsible person to drive you home. 8. Someone MUST be with you the first 24 hours after you arrive home or your discharge will be delayed. 9. Please wear clothes that are easy to get on and off and wear slip-on shoes.  Thank you for allowing [place] to care for you!   -- [facility]  COVID PRE- TEST: You will need a COVID TEST prior to the procedure:  LOCATION: [place] [facility]Thru Testing site.  DATE/TIME: GO TODAY AND CALL THE NUMBER ON THE SIGN       Follow-Up:  1-2 wk follow up  Any Other Special Instructions Will Be Listed Below (If Applicable).      If you need a refill on your cardiac medications before your next appointment, please call your pharmacy.

## 2020-09-02 NOTE — H&P (View-Only) (Signed)
Cardiology Office Note:    Date:  09/02/2020   ID:  DENELL COTHERN, DOB 03-28-1946, MRN [id number]  PCP:  [name], MD  [facility] Cardiologist:  [name], MD  [facility] Electrophysiologist:  None   Referring MD: [name], MD   Chief Complaint: 2 week follow-up for SOB and CP  History of Present Illness:    Denise Sharp is a 75 y.o. female with a hx of CAD s/p recent PCI/DESx2 to the ostial and mid LAD in the setting of unstable angina (12/2019), HTN, DM2, iron deficiency anemia s/p gastric bypass x2, IBS, esophageal stenosis, morbid obesity, chronic dizziness with gait instability, cervicogenic headache with cervical spine stenosis, OSA on CPAP, restless leg syndrome, and adrenal mass, who presents for 2 week follow-up.   She was seen 08/19/20 for follow-up of CAD and reported SOB and chest pain requiring SL NTG. She was started on lasix 40 mg daily and potassium. Might need R/L HC if sxs do not improve. Also needing EGD clearance, however this was deferred in the setting of cardiac symptoms.   Today, the patient reports she has been taking lasix every day. Weight has gone down 260lbs>251lbs. Patient says she is still short of breath and can't walk across the room. She also reports chest pain on the right side. Chest pain is worse with exertion, although has not needed SL NTG. At the last visit she reported left-sided chest pain on the left side before requiring NTG, but this has not reoccurred. Recent labs 2/16 were unremarkable.  TSH wnl.    Past Medical History:  Diagnosis Date  . Abnormality of gait 03/25/2013  . Adrenal mass, left ([place])   . Anemia    iron deficient post  2 unit txfsn 2009, normal endo/colonoscopy by [facility]  . Arthritis   . Atypical chest pain    a. 04/1986 Cath ([facility]): nl cors, EF 65%; b. 07/2012 Cath [facility]): LM nl, LAD/LCX/RCA w/ min irregs; c. 07/2016 MV [facility]): "Equivocal" w/ small inf wall rev defect and hyperdynamic LV  contraction, EF 87%; d. 08/2016 Cardiac CT Ca2+ score [facility]): Ca2+ score 1548; e. 05/2019 MV: No ischemia. Diaph atten. EF 75%.  . Cervical spinal stenosis 1994   due to trauma to back ([employer] accident), has intermittent paralysis and parasthesias  . Cervicogenic headache 03/23/2014  . Depression   . Diastolic dysfunction    a. 12/2019 Echo: EF 65-70%, Gr2 DD. No significant valvular dzs.  . Dizziness    chronic dizziness  . DJD (degenerative joint disease)    a. Chronic R shoulder pain pending R shoulder replacement 07/2017.  [name] Esophageal stenosis March 2011   with transietn outlet obstruction by food, cleared by EGD   . Family history of adverse reaction to anesthesia    daughter PONV  . Gastric bypass status for obesity   . Headache(784.0)   . Hypertension   . IBS (irritable bowel syndrome)   . Left bundle branch block (LBBB)    a. Intermittently present - likely rate related. - patient denies  . Obesity   . Obstructive sleep apnea    using CPAP  . 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 Restless leg syndrome   . Rotator cuff arthropathy, right 08/13/2017  . Syncope and collapse 03/12/2014  . Type II diabetes mellitus ([place])     Past Surgical History:  Procedure Laterality Date  . ABDOMINAL HYSTERECTOMY    . APPENDECTOMY    . CARDIAC CATHETERIZATION    .  COLONOSCOPY    . CORONARY STENT INTERVENTION N/A 01/04/2020   Procedure: CORONARY STENT INTERVENTION;  Surgeon: [name], MD;  Location: [place] [facility];  Service: Cardiovascular;  Laterality: N/A;  LAD   . DIAPHRAGMATIC HERNIA REPAIR  2015  . ESOPHAGEAL DILATION     multiple  . ESOPHAGOGASTRODUODENOSCOPY    . [place]  . GALLBLADDER SURGERY  resection  . GASTRIC BYPASS    . GASTRIC BYPASS  2000, 2005   Dr. Debroah Loop  . [facility] IMPLANT PLACEMENT  April 2013   [place]  . JOINT REPLACEMENT  2007   bilateral knee. [name],  [name]  . LEFT HEART CATH AND CORS/GRAFTS ANGIOGRAPHY N/A  12/31/2019   Procedure: LEFT HEART CATH AND CORS/GRAFTS ANGIOGRAPHY;  Surgeon: [name], MD;  Location: [place] [facility];  Service: Cardiovascular;  Laterality: N/A;  . PANNICULECTOMY  06/16/2019  . PANNICULECTOMY N/A 06/16/2019   Procedure: PANNICULECTOMY;  Surgeon: [name], MD;  Location: [place];  Service: Plastics;  Laterality: N/A;  3 hours, please  . REVERSE SHOULDER ARTHROPLASTY Right 08/13/2017   Procedure: REVERSE RIGHT SHOULDER ARTHROPLASTY;  Surgeon: [name], MD;  Location: [place];  Service: Orthopedics;  Laterality: Right;  . ROTATOR CUFF REPAIR     right  . [place]  . TOTAL ABDOMINAL HYSTERECTOMY W/ BILATERAL SALPINGOOPHORECTOMY  1974  . UMBILICAL HERNIA REPAIR  Aug 11, 2015    Current Medications: Current Meds  Medication Sig  . albuterol (VENTOLIN HFA) 108 (90 Base) MCG/ACT inhaler Inhale 2 puffs into the lungs every 8 (eight) hours as needed for wheezing or shortness of breath.  [name] aspirin EC 81 MG tablet Take 1 tablet (81 mg total) by mouth daily.  [name] atorvastatin (LIPITOR) 40 MG tablet Take 1 tablet (40 mg total) by mouth daily.  [name] buPROPion (WELLBUTRIN) 75 MG tablet TAKE 2 TABLETS BY MOUTH EVERY MORNING AND 1 TABLET IN THE AFTERNOON  . butalbital-acetaminophen-caffeine (FIORICET) 50-325-40 MG tablet TAKE 1 TABLET BY MOUTH EVERY 6 HOURS AS NEEDED  . citalopram (CELEXA) 10 MG tablet Take 1 tablet (10 mg total) by mouth daily.  . clopidogrel (PLAVIX) 75 MG tablet Take 1 tablet (75 mg total) by mouth daily.  . clotrimazole-betamethasone (LOTRISONE) cream Apply 1 application topically 2 (two) times daily as needed.  . Continuous Blood Gluc Receiver (FREESTYLE LIBRE 2 READER) DEVI Use to check sugar at least TID  . Continuous Blood Gluc Sensor (FREESTYLE LIBRE 14 DAY SENSOR) MISC Use to check sugar at least 3 times daily  . cyanocobalamin (,VITAMIN B-12,) 1000 MCG/ML injection Inject 1 mL (1,000 mcg total) into the muscle every 30  (thirty) days. Inject 1 ml (1000 mcg ) IM weekly x 4,  Then monthly thereafter  . dapagliflozin propanediol (FARXIGA) 10 MG TABS tablet Take 1 tablet (10 mg total) by mouth daily before breakfast.  . diazepam (VALIUM) 5 MG tablet Take 1 tablet (5 mg total) by mouth at bedtime as needed for anxiety.  . diphenhydrAMINE (BENADRYL) 25 MG tablet Take 50 mg by mouth daily as needed for allergies.   . diphenoxylate-atropine (LOMOTIL) 2.5-0.025 MG tablet Take 1 tablet by mouth 4 (four) times daily as needed for diarrhea or loose stools.  . Eszopiclone 3 MG TABS Take 1 tablet (3 mg total) by mouth at bedtime.  . furosemide (LASIX) 40 MG tablet Take 1 tablet (40 mg total) by mouth daily.  . hyoscyamine (ANASPAZ) 0.125 MG TBDP  disintergrating tablet DISSOLVE UNDER TONGUE 15 MINUTES BEFORE MEALS  . insulin aspart (NOVOLOG FLEXPEN) 100 UNIT/ML FlexPen Inject 8 units up to 3 times daily before meals  . Insulin Glargine (LANTUS SOLOSTAR) 100 UNIT/ML Solostar Pen Inject 11 Units into the skin daily.  . Insulin Pen Needle (DROPLET PEN NEEDLES) 31G X 5 MM MISC USE ONE NEEDLE SUBCUTANEOUSLY AS DIRECTED (REMOVE AND DISCARD NEEDLE IN SHARPS CONTAINER IMMEDIATELY AFTER USE)  . Insulin Syringe-Needle U-100 (INSULIN SYRINGE 1CC/30GX5/16") 30G X 5/16" 1 ML MISC 1 Stick by Does not apply route 3 (three) times daily as needed. [id number]  . ipratropium (ATROVENT) 0.06 % nasal spray Place 2 sprays into both nostrils 4 (four) times daily as needed for rhinitis.  [name] loperamide (IMODIUM) 2 MG capsule Take by mouth.  . nitroGLYCERIN (NITROSTAT) 0.4 MG SL tablet Place 1 tablet (0.4 mg total) under the tongue every 5 (five) minutes as needed for chest pain.  . NONFORMULARY OR COMPOUNDED Will  Combination Pain Cream -  Baclofen 2%, Doxepin 5%, Gabapentin 6%, Topiramate 2%, Pentoxifylline 3% Apply 1-2 grams to affected area 3-4 times daily Qty. 120 gm 3 refills  . nystatin (MYCOSTATIN/NYSTOP) powder Apply 1 g  topically 4 (four) times daily as needed (rash).   . potassium chloride SA (KLOR-CON) 20 MEQ tablet Take 1 tablet (20 mEq total) by mouth daily.  . pregabalin (LYRICA) 100 MG capsule Take 100 mg by mouth at bedtime as needed (leg cramps).   . traMADol (ULTRAM) 50 MG tablet Take 0.5 tablets (25 mg total) by mouth every 12 (twelve) hours as needed for severe pain.  [name] umeclidinium-vilanterol (ANORO ELLIPTA) 62.5-25 MCG/INH AEPB Inhale 1 puff into the lungs daily.     Allergies:   Demeclocycline, Erythromycin, Flagyl [metronidazole], Glucophage [metformin hcl], Tetracyclines & related, Diovan [valsartan], Sulfa antibiotics, and Xanax [alprazolam]   Social History   Socioeconomic History  . Marital status: Married    Spouse name: [name]   . Number of children: 2  . Years of education: College   . Highest education level: Not on file  Occupational History    Employer: retired  Tobacco Use  . Smoking status: Never Smoker  . Smokeless tobacco: Never Used  Vaping Use  . Vaping Use: Never used  Substance and Sexual Activity  . Alcohol use: No  . Drug use: No  . Sexual activity: Not Currently    Birth control/protection: Post-menopausal, Surgical  Other Topics Concern  . Not on file  Social History Narrative   Patient lives at home with husband [name]. Patient has 2 children. Patient has [employer]. Patient is on disability; retd RN; Patient is right handed.    Social Determinants of Health   Financial Resource Strain: Low Risk   . Difficulty of Paying Living Expenses: Not hard at all  Food Insecurity: Not on file  Transportation Needs: No Transportation Needs  . Lack of Transportation (Medical): No  . Lack of Transportation (Non-Medical): No  Physical Activity: Not on file  Stress: Stress Concern Present  . Feeling of Stress : Rather much  Social Connections: Not on file     Family History: The patient's family history includes Depression in her mother; Diabetes in her  daughter; Headache in her mother; Heart disease in her daughter, father, and mother; Hypertension in her daughter, father, mother, and son; Osteoporosis in her father; Prostate cancer in her father; Stroke in her father and mother; Thyroid disease in her mother.  ROS:   Please see  the history of present illness.     All other systems reviewed and are negative.  EKGs/Labs/Other Studies Reviewed:    The following studies were reviewed today:  Cardiac cath 12/2019   Ost LAD to Prox LAD lesion is 80% stenosed.  Post intervention, there is a 0% residual stenosis.  Post intervention, there is a 0% residual stenosis.  A drug-eluting stent was successfully placed using a STENT RESOLUTE ONYX 3.5X12.  Mid LAD lesion is 80% stenosed.  A drug-eluting stent was successfully placed using a [place] [id number].   Successful PCI and drug-eluting stent placement to mid as well as ostial LAD.  Recommendations: Dual antiplatelet therapy for at least 6 months but should consider long-term dual antiplatelet therapy given that the ostial LAD stent extends a millimeter into the left main coronary artery Aggressive treatment of risk factors. Likely discharge home tomorrow if no issues.   Coronary Diagrams   Diagnostic Dominance: Right    Intervention        Echo 12/2019 1. Left ventricular ejection fraction, by estimation, is 65 to 70%. The  left ventricle has normal function. The left ventricle has no regional  wall motion abnormalities. Left ventricular diastolic parameters are  consistent with Grade II diastolic  dysfunction (pseudonormalization).  2. Right ventricular systolic function is normal. The right ventricular  size is normal.  3. The mitral valve is normal in structure. No evidence of mitral valve  regurgitation. No evidence of mitral stenosis.  4. The aortic valve is normal in structure. Aortic valve regurgitation is  not visualized. No aortic stenosis is  present.   EKG:  EKG is not ordered today.   Recent Labs: 07/17/2020: ALT 12; TSH 3.695 08/31/2020: BUN 15; Creatinine, Ser 1.13; Hemoglobin 13.7; Platelets 277; Potassium 4.1; Sodium 140  Recent Lipid Panel    Component Value Date/Time   CHOL 134 05/24/2020 1532   CHOL 155 02/27/2014 0420   TRIG 128.0 05/24/2020 1532   TRIG 121 02/27/2014 0420   HDL 60.40 05/24/2020 1532   HDL 43 02/27/2014 0420   CHOLHDL 2 05/24/2020 1532   VLDL 25.6 05/24/2020 1532   VLDL 24 02/27/2014 0420   LDLCALC 48 05/24/2020 1532   LDLCALC 88 02/27/2014 0420   LDLDIRECT 111.0 06/16/2015 0936     Risk Assessment/Calculations:       Physical Exam:    VS:  BP 120/80 (BP Location: Right Arm, Patient Position: Sitting, Cuff Size: Large)   Pulse 88   Ht 5' 4.5" (1.638 m)   Wt 251 lb (113.9 kg)   SpO2 96%   BMI 42.42 kg/m     Wt Readings from Last 3 Encounters:  09/02/20 251 lb (113.9 kg)  08/31/20 253 lb (114.8 kg)  08/19/20 260 lb 2 oz (118 kg)     GEN:  Well nourished, well developed in no acute distress HEENT: Normal NECK: No JVD; No carotid bruits LYMPHATICS: No lymphadenopathy CARDIAC: RRR, no murmurs, rubs, gallops RESPIRATORY:  Clear to auscultation without rales, wheezing or rhonchi  ABDOMEN: Soft, non-tender, non-distended MUSCULOSKELETAL:  Trace edema; No deformity  SKIN: Warm and dry NEUROLOGIC:  Alert and oriented x 3 PSYCHIATRIC:  Normal affect   ASSESSMENT:    1. Coronary artery disease involving native coronary artery of native heart with angina pectoris ([place])   2. Essential hypertension   3. Hyperlipidemia LDL goal <70   4. Chronic heart failure with preserved ejection fraction (HFpEF) ([place])   5. Morbid obesity ([place])   6.  Dyspnea on exertion   7. Chest pain of uncertain etiology    PLAN:    In order of problems listed above:  CAD s/p PCI ostial and mLAD 12/2019 with persistent chest pain  Patient reports exertional right sided chest pain. She has not needed NTG.  She is still having persistent shortness of breath as well. Given persistence of symptoms would set patient up for R/L heart cath. This will also give a definite answer prior to EGD. Continue DAPT with aspirin and plavix.   SOB/HFpEF Patient has been taking lasix 40mg  daily and potassium. Weight is down 9lbs. Recent BMET stable. She has trace edema on exam. SOB is unchanged since the last visit, overall no real improvement. Plan for R/L heart cath as above. Continue current dose of lasix.   HTN BP good today. Continue current medications  HLD Continue statin  Morbid obesity I feel this does contribute to chronic shortness of breath. Weight loss encouraged.   Disposition: Follow up after cath with APP/MD   Shared Decision Making/Informed Consent   Shared Decision Making/Informed Consent The risks [stroke (1 in 1000), death (1 in 1000), kidney failure [usually temporary] (1 in 500), bleeding (1 in 200), allergic reaction [possibly serious] (1 in 200)], benefits (diagnostic support and management of coronary artery disease) and alternatives of a cardiac catheterization were discussed in detail with Ms. Sharlett Iles and she is willing to proceed.        Signed, Steffon Gladu Ninfa Meeker, PA-C  09/02/2020 4:56 PM    [facility]

## 2020-09-02 NOTE — Progress Notes (Signed)
Cardiology Office Note:    Date:  09/02/2020   ID:  Denise Sharp, DOB Mar 16, 1946, MRN [id number]  PCP:  Denise Mc, MD  [facility] Cardiologist:  [name], MD  [facility] Electrophysiologist:  None   Referring MD: Denise Mc, MD   Chief Complaint: 2 week follow-up for SOB and CP  History of Present Illness:    Denise Sharp is a 75 y.o. female with a hx of CAD s/p recent PCI/DESx2 to the ostial and mid LAD in the setting of unstable angina (12/2019), HTN, DM2, iron deficiency anemia s/p gastric bypass x2, IBS, esophageal stenosis, morbid obesity, chronic dizziness with gait instability, cervicogenic headache with cervical spine stenosis, OSA on CPAP, restless leg syndrome, and adrenal mass, who presents for 2 week follow-up.   She was seen 08/19/20 for follow-up of CAD and reported SOB and chest pain requiring SL NTG. She was started on lasix 40 mg daily and potassium. Might need R/L HC if sxs do not improve. Also needing EGD clearance, however this was deferred in the setting of cardiac symptoms.   Today, the patient reports she has been taking lasix every day. Weight has gone down 260lbs>251lbs. Patient says she is still short of breath and can't walk across the room. She also reports chest pain on the right side. Chest pain is worse with exertion, although has not needed SL NTG. At the last visit she reported left-sided chest pain on the left side before requiring NTG, but this has not reoccurred. Recent labs 2/16 were unremarkable.  TSH wnl.    Past Medical History:  Diagnosis Date  . Abnormality of gait 03/25/2013  . Adrenal mass, left ([place])   . Anemia    iron deficient post  2 unit txfsn 2009, normal endo/colonoscopy by [facility]  . Arthritis   . Atypical chest pain    a. 04/1986 Cath ([facility]): nl cors, EF 65%; b. 07/2012 Cath [facility]): LM nl, LAD/LCX/RCA w/ min irregs; c. 07/2016 MV [facility]): "Equivocal" w/ small inf wall rev defect and hyperdynamic LV  contraction, EF 87%; d. 08/2016 Cardiac CT Ca2+ score [facility]): Ca2+ score 1548; e. 05/2019 MV: No ischemia. Diaph atten. EF 75%.  . Cervical spinal stenosis 1994   due to trauma to back ([employer] accident), has intermittent paralysis and parasthesias  . Cervicogenic headache 03/23/2014  . Depression   . Diastolic dysfunction    a. 12/2019 Echo: EF 65-70%, Gr2 DD. No significant valvular dzs.  . Dizziness    chronic dizziness  . DJD (degenerative joint disease)    a. Chronic R shoulder pain pending R shoulder replacement 07/2017.  [name] Esophageal stenosis March 2011   with transietn outlet obstruction by food, cleared by EGD   . Family history of adverse reaction to anesthesia    daughter PONV  . Gastric bypass status for obesity   . Headache(784.0)   . Hypertension   . IBS (irritable bowel syndrome)   . Left bundle branch block (LBBB)    a. Intermittently present - likely rate related. - patient denies  . Obesity   . Obstructive sleep apnea    using CPAP  . 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 Restless leg syndrome   . Rotator cuff arthropathy, right 08/13/2017  . Syncope and collapse 03/12/2014  . Type II diabetes mellitus ([place])     Past Surgical History:  Procedure Laterality Date  . ABDOMINAL HYSTERECTOMY    . APPENDECTOMY    . CARDIAC CATHETERIZATION    .  COLONOSCOPY    . CORONARY STENT INTERVENTION N/A 01/04/2020   Procedure: CORONARY STENT INTERVENTION;  Surgeon: [name], MD;  Location: [place] [facility];  Service: Cardiovascular;  Laterality: N/A;  LAD   . DIAPHRAGMATIC HERNIA REPAIR  2015  . ESOPHAGEAL DILATION     multiple  . ESOPHAGOGASTRODUODENOSCOPY    . [place]  . GALLBLADDER SURGERY  resection  . GASTRIC BYPASS    . GASTRIC BYPASS  2000, 2005   Dr. Debroah Loop  . [facility] IMPLANT PLACEMENT  April 2013   [place]  . JOINT REPLACEMENT  2007   bilateral knee. [name],  [name]  . LEFT HEART CATH AND CORS/GRAFTS ANGIOGRAPHY N/A  12/31/2019   Procedure: LEFT HEART CATH AND CORS/GRAFTS ANGIOGRAPHY;  Surgeon: [name], MD;  Location: [place] [facility];  Service: Cardiovascular;  Laterality: N/A;  . PANNICULECTOMY  06/16/2019  . PANNICULECTOMY N/A 06/16/2019   Procedure: PANNICULECTOMY;  Surgeon: [name], MD;  Location: [place];  Service: Plastics;  Laterality: N/A;  3 hours, please  . REVERSE SHOULDER ARTHROPLASTY Right 08/13/2017   Procedure: REVERSE RIGHT SHOULDER ARTHROPLASTY;  Surgeon: [name], MD;  Location: [place];  Service: Orthopedics;  Laterality: Right;  . ROTATOR CUFF REPAIR     right  . [place]  . TOTAL ABDOMINAL HYSTERECTOMY W/ BILATERAL SALPINGOOPHORECTOMY  1974  . UMBILICAL HERNIA REPAIR  Aug 11, 2015    Current Medications: Current Meds  Medication Sig  . albuterol (VENTOLIN HFA) 108 (90 Base) MCG/ACT inhaler Inhale 2 puffs into the lungs every 8 (eight) hours as needed for wheezing or shortness of breath.  [name] aspirin EC 81 MG tablet Take 1 tablet (81 mg total) by mouth daily.  [name] atorvastatin (LIPITOR) 40 MG tablet Take 1 tablet (40 mg total) by mouth daily.  [name] buPROPion (WELLBUTRIN) 75 MG tablet TAKE 2 TABLETS BY MOUTH EVERY MORNING AND 1 TABLET IN THE AFTERNOON  . butalbital-acetaminophen-caffeine (FIORICET) 50-325-40 MG tablet TAKE 1 TABLET BY MOUTH EVERY 6 HOURS AS NEEDED  . citalopram (CELEXA) 10 MG tablet Take 1 tablet (10 mg total) by mouth daily.  . clopidogrel (PLAVIX) 75 MG tablet Take 1 tablet (75 mg total) by mouth daily.  . clotrimazole-betamethasone (LOTRISONE) cream Apply 1 application topically 2 (two) times daily as needed.  . Continuous Blood Gluc Receiver (FREESTYLE LIBRE 2 READER) DEVI Use to check sugar at least TID  . Continuous Blood Gluc Sensor (FREESTYLE LIBRE 14 DAY SENSOR) MISC Use to check sugar at least 3 times daily  . cyanocobalamin (,VITAMIN B-12,) 1000 MCG/ML injection Inject 1 mL (1,000 mcg total) into the muscle every 30  (thirty) days. Inject 1 ml (1000 mcg ) IM weekly x 4,  Then monthly thereafter  . dapagliflozin propanediol (FARXIGA) 10 MG TABS tablet Take 1 tablet (10 mg total) by mouth daily before breakfast.  . diazepam (VALIUM) 5 MG tablet Take 1 tablet (5 mg total) by mouth at bedtime as needed for anxiety.  . diphenhydrAMINE (BENADRYL) 25 MG tablet Take 50 mg by mouth daily as needed for allergies.   . diphenoxylate-atropine (LOMOTIL) 2.5-0.025 MG tablet Take 1 tablet by mouth 4 (four) times daily as needed for diarrhea or loose stools.  . Eszopiclone 3 MG TABS Take 1 tablet (3 mg total) by mouth at bedtime.  . furosemide (LASIX) 40 MG tablet Take 1 tablet (40 mg total) by mouth daily.  . hyoscyamine (ANASPAZ) 0.125 MG TBDP  disintergrating tablet DISSOLVE UNDER TONGUE 15 MINUTES BEFORE MEALS  . insulin aspart (NOVOLOG FLEXPEN) 100 UNIT/ML FlexPen Inject 8 units up to 3 times daily before meals  . Insulin Glargine (LANTUS SOLOSTAR) 100 UNIT/ML Solostar Pen Inject 11 Units into the skin daily.  . Insulin Pen Needle (DROPLET PEN NEEDLES) 31G X 5 MM MISC USE ONE NEEDLE SUBCUTANEOUSLY AS DIRECTED (REMOVE AND DISCARD NEEDLE IN SHARPS CONTAINER IMMEDIATELY AFTER USE)  . Insulin Syringe-Needle U-100 (INSULIN SYRINGE 1CC/30GX5/16") 30G X 5/16" 1 ML MISC 1 Stick by Does not apply route 3 (three) times daily as needed. [id number]  . ipratropium (ATROVENT) 0.06 % nasal spray Place 2 sprays into both nostrils 4 (four) times daily as needed for rhinitis.  [name] loperamide (IMODIUM) 2 MG capsule Take by mouth.  . nitroGLYCERIN (NITROSTAT) 0.4 MG SL tablet Place 1 tablet (0.4 mg total) under the tongue every 5 (five) minutes as needed for chest pain.  . NONFORMULARY OR COMPOUNDED [place]  Combination Pain Cream -  Baclofen 2%, Doxepin 5%, Gabapentin 6%, Topiramate 2%, Pentoxifylline 3% Apply 1-2 grams to affected area 3-4 times daily Qty. 120 gm 3 refills  . nystatin (MYCOSTATIN/NYSTOP) powder Apply 1 g  topically 4 (four) times daily as needed (rash).   . potassium chloride SA (KLOR-CON) 20 MEQ tablet Take 1 tablet (20 mEq total) by mouth daily.  . pregabalin (LYRICA) 100 MG capsule Take 100 mg by mouth at bedtime as needed (leg cramps).   . traMADol (ULTRAM) 50 MG tablet Take 0.5 tablets (25 mg total) by mouth every 12 (twelve) hours as needed for severe pain.  [name] umeclidinium-vilanterol (ANORO ELLIPTA) 62.5-25 MCG/INH AEPB Inhale 1 puff into the lungs daily.     Allergies:   Demeclocycline, Erythromycin, Flagyl [metronidazole], Glucophage [metformin hcl], Tetracyclines & related, Diovan [valsartan], Sulfa antibiotics, and Xanax [alprazolam]   Social History   Socioeconomic History  . Marital status: Married    Spouse name: [name]   . Number of children: 2  . Years of education: College   . Highest education level: Not on file  Occupational History    Employer: retired  Tobacco Use  . Smoking status: Never Smoker  . Smokeless tobacco: Never Used  Vaping Use  . Vaping Use: Never used  Substance and Sexual Activity  . Alcohol use: No  . Drug use: No  . Sexual activity: Not Currently    Birth control/protection: Post-menopausal, Surgical  Other Topics Concern  . Not on file  Social History Narrative   Patient lives at home with husband [name]. Patient has 2 children. Patient has [employer]. Patient is on disability; retd RN; Patient is right handed.    Social Determinants of Health   Financial Resource Strain: Low Risk   . Difficulty of Paying Living Expenses: Not hard at all  Food Insecurity: Not on file  Transportation Needs: No Transportation Needs  . Lack of Transportation (Medical): No  . Lack of Transportation (Non-Medical): No  Physical Activity: Not on file  Stress: Stress Concern Present  . Feeling of Stress : Rather much  Social Connections: Not on file     Family History: The patient's family history includes Depression in her mother; Diabetes in her  daughter; Headache in her mother; Heart disease in her daughter, father, and mother; Hypertension in her daughter, father, mother, and son; Osteoporosis in her father; Prostate cancer in her father; Stroke in her father and mother; Thyroid disease in her mother.  ROS:   Please see  the history of present illness.     All other systems reviewed and are negative.  EKGs/Labs/Other Studies Reviewed:    The following studies were reviewed today:  Cardiac cath 12/2019   Ost LAD to Prox LAD lesion is 80% stenosed.  Post intervention, there is a 0% residual stenosis.  Post intervention, there is a 0% residual stenosis.  A drug-eluting stent was successfully placed using a STENT RESOLUTE ONYX 3.5X12.  Mid LAD lesion is 80% stenosed.  A drug-eluting stent was successfully placed using a [place] [id number].   Successful PCI and drug-eluting stent placement to mid as well as ostial LAD.  Recommendations: Dual antiplatelet therapy for at least 6 months but should consider long-term dual antiplatelet therapy given that the ostial LAD stent extends a millimeter into the left main coronary artery Aggressive treatment of risk factors. Likely discharge home tomorrow if no issues.   Coronary Diagrams   Diagnostic Dominance: Right    Intervention        Echo 12/2019 1. Left ventricular ejection fraction, by estimation, is 65 to 70%. The  left ventricle has normal function. The left ventricle has no regional  wall motion abnormalities. Left ventricular diastolic parameters are  consistent with Grade II diastolic  dysfunction (pseudonormalization).  2. Right ventricular systolic function is normal. The right ventricular  size is normal.  3. The mitral valve is normal in structure. No evidence of mitral valve  regurgitation. No evidence of mitral stenosis.  4. The aortic valve is normal in structure. Aortic valve regurgitation is  not visualized. No aortic stenosis is  present.   EKG:  EKG is not ordered today.   Recent Labs: 07/17/2020: ALT 12; TSH 3.695 08/31/2020: BUN 15; Creatinine, Ser 1.13; Hemoglobin 13.7; Platelets 277; Potassium 4.1; Sodium 140  Recent Lipid Panel    Component Value Date/Time   CHOL 134 05/24/2020 1532   CHOL 155 02/27/2014 0420   TRIG 128.0 05/24/2020 1532   TRIG 121 02/27/2014 0420   HDL 60.40 05/24/2020 1532   HDL 43 02/27/2014 0420   CHOLHDL 2 05/24/2020 1532   VLDL 25.6 05/24/2020 1532   VLDL 24 02/27/2014 0420   LDLCALC 48 05/24/2020 1532   LDLCALC 88 02/27/2014 0420   LDLDIRECT 111.0 06/16/2015 0936     Risk Assessment/Calculations:       Physical Exam:    VS:  BP 120/80 (BP Location: Right Arm, Patient Position: Sitting, Cuff Size: Large)   Pulse 88   Ht 5' 4.5" (1.638 m)   Wt 251 lb (113.9 kg)   SpO2 96%   BMI 42.42 kg/m     Wt Readings from Last 3 Encounters:  09/02/20 251 lb (113.9 kg)  08/31/20 253 lb (114.8 kg)  08/19/20 260 lb 2 oz (118 kg)     GEN:  Well nourished, well developed in no acute distress HEENT: Normal NECK: No JVD; No carotid bruits LYMPHATICS: No lymphadenopathy CARDIAC: RRR, no murmurs, rubs, gallops RESPIRATORY:  Clear to auscultation without rales, wheezing or rhonchi  ABDOMEN: Soft, non-tender, non-distended MUSCULOSKELETAL:  Trace edema; No deformity  SKIN: Warm and dry NEUROLOGIC:  Alert and oriented x 3 PSYCHIATRIC:  Normal affect   ASSESSMENT:    1. Coronary artery disease involving native coronary artery of native heart with angina pectoris ([place])   2. Essential hypertension   3. Hyperlipidemia LDL goal <70   4. Chronic heart failure with preserved ejection fraction (HFpEF) ([place])   5. Morbid obesity ([place])   6.  Dyspnea on exertion   7. Chest pain of uncertain etiology    PLAN:    In order of problems listed above:  CAD s/p PCI ostial and mLAD 12/2019 with persistent chest pain  Patient reports exertional right sided chest pain. She has not needed NTG.  She is still having persistent shortness of breath as well. Given persistence of symptoms would set patient up for R/L heart cath. This will also give a definite answer prior to EGD. Continue DAPT with aspirin and plavix.   SOB/HFpEF Patient has been taking lasix 40mg  daily and potassium. Weight is down 9lbs. Recent BMET stable. She has trace edema on exam. SOB is unchanged since the last visit, overall no real improvement. Plan for R/L heart cath as above. Continue current dose of lasix.   HTN BP good today. Continue current medications  HLD Continue statin  Morbid obesity I feel this does contribute to chronic shortness of breath. Weight loss encouraged.   Disposition: Follow up after cath with APP/MD   Shared Decision Making/Informed Consent   Shared Decision Making/Informed Consent The risks [stroke (1 in 1000), death (1 in 1000), kidney failure [usually temporary] (1 in 500), bleeding (1 in 200), allergic reaction [possibly serious] (1 in 200)], benefits (diagnostic support and management of coronary artery disease) and alternatives of a cardiac catheterization were discussed in detail with Ms. Sharlett Iles and she is willing to proceed.        Signed, Haifa Hatton Ninfa Meeker, PA-C  09/02/2020 4:56 PM    [facility]

## 2020-09-03 ENCOUNTER — Other Ambulatory Visit: Payer: Self-pay | Admitting: Internal Medicine

## 2020-09-03 DIAGNOSIS — IMO0002 Reserved for concepts with insufficient information to code with codable children: Secondary | ICD-10-CM

## 2020-09-03 DIAGNOSIS — E1165 Type 2 diabetes mellitus with hyperglycemia: Secondary | ICD-10-CM

## 2020-09-03 DIAGNOSIS — E1140 Type 2 diabetes mellitus with diabetic neuropathy, unspecified: Secondary | ICD-10-CM

## 2020-09-05 ENCOUNTER — Other Ambulatory Visit
Admission: RE | Admit: 2020-09-05 | Discharge: 2020-09-05 | Disposition: A | Discharge status: Home / Self Care | Payer: Medicare Other | Source: Ambulatory Visit | Attending: Internal Medicine | Admitting: Internal Medicine

## 2020-09-05 ENCOUNTER — Other Ambulatory Visit: Payer: Self-pay

## 2020-09-05 ENCOUNTER — Telehealth: Payer: Self-pay | Admitting: Internal Medicine

## 2020-09-05 DIAGNOSIS — Z20822 Contact with and (suspected) exposure to covid-19: Secondary | ICD-10-CM | POA: Insufficient documentation

## 2020-09-05 DIAGNOSIS — Z01812 Encounter for preprocedural laboratory examination: Secondary | ICD-10-CM | POA: Diagnosis not present

## 2020-09-05 LAB — SARS CORONAVIRUS 2 (TAT 6-24 HRS): SARS Coronavirus 2: NEGATIVE

## 2020-09-05 NOTE — Telephone Encounter (Signed)
Pt states that [facility] does not have refills ordered for those meds that she listed in this first message. Please advise.

## 2020-09-05 NOTE — Telephone Encounter (Signed)
Patient wants it to be ok'd for her to have 2 visitors during procedure and hospital stay.  Her husband is taking her but is scheduled for knee replacement and may not be able to tolerate staying for the duration.    Patient would like something documented so hospital will allow 2 support persons.

## 2020-09-05 NOTE — Telephone Encounter (Signed)
Spoke with [name] in [place]. She said that the husband and friend could swap out while in the waiting area but only one person in there at a time.  Advised patient of this but that if she were admitted, only one person is allowed to visit during the hospital stay. Advised she would have to talk with the floor nurse supervisor once she is admitted for further advice on that.  She was appreciative.

## 2020-09-06 ENCOUNTER — Ambulatory Visit
Admission: RE | Admit: 2020-09-06 | Discharge: 2020-09-06 | Disposition: A | Discharge status: Home / Self Care | Payer: Medicare Other | Attending: Internal Medicine | Admitting: Internal Medicine

## 2020-09-06 ENCOUNTER — Encounter: Payer: Self-pay | Admitting: Internal Medicine

## 2020-09-06 ENCOUNTER — Other Ambulatory Visit: Payer: Self-pay

## 2020-09-06 ENCOUNTER — Encounter
Admission: RE | Disposition: A | Discharge status: Home / Self Care | Payer: Self-pay | Source: Home / Self Care | Attending: Internal Medicine

## 2020-09-06 DIAGNOSIS — I110 Hypertensive heart disease with heart failure: Secondary | ICD-10-CM | POA: Diagnosis not present

## 2020-09-06 DIAGNOSIS — Z888 Allergy status to other drugs, medicaments and biological substances status: Secondary | ICD-10-CM | POA: Insufficient documentation

## 2020-09-06 DIAGNOSIS — E785 Hyperlipidemia, unspecified: Secondary | ICD-10-CM | POA: Diagnosis not present

## 2020-09-06 DIAGNOSIS — Z6841 Body Mass Index (BMI) 40.0 and over, adult: Secondary | ICD-10-CM | POA: Insufficient documentation

## 2020-09-06 DIAGNOSIS — Z882 Allergy status to sulfonamides status: Secondary | ICD-10-CM | POA: Diagnosis not present

## 2020-09-06 DIAGNOSIS — I25110 Atherosclerotic heart disease of native coronary artery with unstable angina pectoris: Secondary | ICD-10-CM | POA: Diagnosis not present

## 2020-09-06 DIAGNOSIS — R0602 Shortness of breath: Secondary | ICD-10-CM

## 2020-09-06 DIAGNOSIS — R0600 Dyspnea, unspecified: Secondary | ICD-10-CM

## 2020-09-06 DIAGNOSIS — R0609 Other forms of dyspnea: Secondary | ICD-10-CM | POA: Diagnosis not present

## 2020-09-06 DIAGNOSIS — Z955 Presence of coronary angioplasty implant and graft: Secondary | ICD-10-CM | POA: Diagnosis not present

## 2020-09-06 DIAGNOSIS — Z794 Long term (current) use of insulin: Secondary | ICD-10-CM | POA: Diagnosis not present

## 2020-09-06 DIAGNOSIS — I25119 Atherosclerotic heart disease of native coronary artery with unspecified angina pectoris: Secondary | ICD-10-CM | POA: Diagnosis not present

## 2020-09-06 DIAGNOSIS — I200 Unstable angina: Secondary | ICD-10-CM

## 2020-09-06 DIAGNOSIS — Z7902 Long term (current) use of antithrombotics/antiplatelets: Secondary | ICD-10-CM | POA: Diagnosis not present

## 2020-09-06 DIAGNOSIS — R079 Chest pain, unspecified: Secondary | ICD-10-CM

## 2020-09-06 DIAGNOSIS — Z7982 Long term (current) use of aspirin: Secondary | ICD-10-CM | POA: Insufficient documentation

## 2020-09-06 DIAGNOSIS — I509 Heart failure, unspecified: Secondary | ICD-10-CM | POA: Insufficient documentation

## 2020-09-06 DIAGNOSIS — Z79899 Other long term (current) drug therapy: Secondary | ICD-10-CM | POA: Diagnosis not present

## 2020-09-06 HISTORY — PX: RIGHT/LEFT HEART CATH AND CORONARY ANGIOGRAPHY: CATH118266

## 2020-09-06 LAB — GLUCOSE, CAPILLARY
Glucose-Capillary: 186 mg/dL — ABNORMAL HIGH (ref 70–99)
Glucose-Capillary: 177 mg/dL — ABNORMAL HIGH (ref 70–99)

## 2020-09-06 SURGERY — RIGHT/LEFT HEART CATH AND CORONARY ANGIOGRAPHY
Anesthesia: Moderate Sedation

## 2020-09-06 MED ORDER — FENTANYL CITRATE (PF) 100 MCG/2ML IJ SOLN
INTRAMUSCULAR | Status: AC
  Filled 2020-09-06: qty 2

## 2020-09-06 MED ORDER — LIDOCAINE HCL (PF) 1 % IJ SOLN
INTRAMUSCULAR | Status: AC
  Filled 2020-09-06: qty 30

## 2020-09-06 MED ORDER — SODIUM CHLORIDE 0.9 % IV SOLN: 250.0000 mL | INTRAVENOUS | Status: DC | PRN

## 2020-09-06 MED ORDER — FENTANYL CITRATE (PF) 100 MCG/2ML IJ SOLN
INTRAMUSCULAR | Status: DC | PRN
  Administered 2020-09-06: 25 ug via INTRAVENOUS

## 2020-09-06 MED ORDER — VERAPAMIL HCL 2.5 MG/ML IV SOLN
INTRAVENOUS | Status: AC
  Filled 2020-09-06: qty 2

## 2020-09-06 MED ORDER — HEPARIN (PORCINE) IN NACL 1000-0.9 UT/500ML-% IV SOLN
INTRAVENOUS | Status: DC | PRN
  Administered 2020-09-06 (×2): 500 mL

## 2020-09-06 MED ORDER — SODIUM CHLORIDE 0.9 % IV SOLN: INTRAVENOUS | Status: DC

## 2020-09-06 MED ORDER — IOHEXOL 300 MG/ML  SOLN
INTRAMUSCULAR | Status: DC | PRN
  Administered 2020-09-06: 46 mL

## 2020-09-06 MED ORDER — SODIUM CHLORIDE 0.9% FLUSH: 3.0000 mL | INTRAVENOUS | Status: DC | PRN

## 2020-09-06 MED ORDER — ONDANSETRON HCL 4 MG/2ML IJ SOLN
INTRAMUSCULAR | Status: AC
  Filled 2020-09-06: qty 2

## 2020-09-06 MED ORDER — VERAPAMIL HCL 2.5 MG/ML IV SOLN
INTRAVENOUS | Status: DC | PRN
  Administered 2020-09-06: 2.5 mg via INTRA_ARTERIAL

## 2020-09-06 MED ORDER — HEPARIN (PORCINE) IN NACL 1000-0.9 UT/500ML-% IV SOLN
INTRAVENOUS | Status: AC
  Filled 2020-09-06: qty 1000

## 2020-09-06 MED ORDER — SODIUM CHLORIDE 0.9% FLUSH: 3.0000 mL | Freq: Two times a day (BID) | INTRAVENOUS | Status: DC

## 2020-09-06 MED ORDER — HEPARIN SODIUM (PORCINE) 1000 UNIT/ML IJ SOLN
INTRAMUSCULAR | Status: AC
  Filled 2020-09-06: qty 1

## 2020-09-06 MED ORDER — LIDOCAINE HCL (PF) 1 % IJ SOLN
INTRAMUSCULAR | Status: DC | PRN
  Administered 2020-09-06 (×2): 2 mL

## 2020-09-06 MED ORDER — MIDAZOLAM HCL 2 MG/2ML IJ SOLN
INTRAMUSCULAR | Status: AC
  Filled 2020-09-06: qty 2

## 2020-09-06 MED ORDER — MIDAZOLAM HCL 2 MG/2ML IJ SOLN
INTRAMUSCULAR | Status: DC | PRN
  Administered 2020-09-06: 1 mg via INTRAVENOUS

## 2020-09-06 MED ORDER — HEPARIN SODIUM (PORCINE) 1000 UNIT/ML IJ SOLN
INTRAMUSCULAR | Status: DC | PRN
  Administered 2020-09-06: 5000 [IU] via INTRAVENOUS

## 2020-09-06 MED ORDER — ONDANSETRON HCL 4 MG/2ML IJ SOLN
INTRAMUSCULAR | Status: DC | PRN
  Administered 2020-09-06: 4 mg via INTRAVENOUS

## 2020-09-06 SURGICAL SUPPLY — 15 items
CATH BALLN WEDGE 5F 110CM (CATHETERS) ×1 IMPLANT
CATH INFINITI 5FR JK (CATHETERS) ×1 IMPLANT
CATH INFINITI JR4 5F (CATHETERS) ×1 IMPLANT
CATH LAUNCHER 5F EBU3.0 (CATHETERS) IMPLANT
CATHETER LAUNCHER 5F EBU3.0 (CATHETERS) ×2
DEVICE RAD TR BAND REGULAR (VASCULAR PRODUCTS) ×1 IMPLANT
GLIDESHEATH SLEND A-KIT 6F 22G (SHEATH) ×1 IMPLANT
GUIDEWIRE INQWIRE 1.5J.035X260 (WIRE) IMPLANT
INQWIRE 1.5J .035X260CM (WIRE) ×2
PACK CARDIAC CATH (CUSTOM PROCEDURE TRAY) ×2 IMPLANT
PROTECTION STATION PRESSURIZED (MISCELLANEOUS) ×2
SET ATX SIMPLICITY (MISCELLANEOUS) ×1 IMPLANT
SHEATH GLIDE SLENDER 4/5FR (SHEATH) ×1 IMPLANT
STATION PROTECTION PRESSURIZED (MISCELLANEOUS) IMPLANT
WIRE HITORQ VERSACORE ST 145CM (WIRE) ×1 IMPLANT

## 2020-09-06 NOTE — Interval H&P Note (Signed)
History and Physical Interval Note:  09/06/2020 12:19 PM  [name]  has presented today for surgery, with the diagnosis of accelerating angina.  The various methods of treatment have been discussed with the patient and family. After consideration of risks, benefits and other options for treatment, the patient has consented to  Procedure(s): RIGHT/LEFT HEART CATH AND CORONARY ANGIOGRAPHY (N/A) as a surgical intervention.  The patient's history has been reviewed, patient examined, no change in status, stable for surgery.  I have reviewed the patient's chart and labs.  Questions were answered to the patient's satisfaction.    Cath Lab Visit (complete for each Cath Lab visit)  Clinical Evaluation Leading to the Procedure:   ACS: No.  Non-ACS:    Anginal Classification: CCS IV  Anti-ischemic medical therapy: No Therapy  Non-Invasive Test Results: No non-invasive testing performed  Prior CABG: No previous CABG  Denise Sharp

## 2020-09-07 NOTE — Progress Notes (Signed)
Cardiology Office Note    Date:  09/09/2020   ID:  Denise Sharp, DOB 02/16/1946, MRN [id number]  PCP:  [name], MD  Cardiologist:  [name], MD  Electrophysiologist:  None   Chief Complaint: Cath follow up  History of Present Illness:   Denise Sharp is a 75 y.o. female with history of CAD s/p PCI to the ostial and mid LAD in the setting of unstable angina, HFpEF, DM2, HTN, iron deficiency anemia s/p gastric bypass x 2, IBS, esophageal stenosis, chronic dizziness with gait instability, cervicogenic headache with cervical spine stenosis, OSA on CPAP, RLS, and adrenal mass who presents for follow up of  diagnostic R/LHC.  Prior ischemic evaluations in 1987 and 2014 via cardiac cath revealed minor luminal irregularities.  Nuclear stress testing in 05/2019 was low risk.  More recently, she was seen in the office in 11/2019 with worsening exertional dyspnea.  Echo in 12/2019 showed preserved LVEF 65 to 70%, no regional wall motion abnormalities, grade 2 diastolic dysfunction, normal RV systolic function and ventricular cavity size, and no significant valvular abnormalities.  During the procedure, she complained of significant chest pain over the preceding 2 weeks and was sent to the ED and ultimately admitted.  Subsequent diagnostic cardiac cath showed significant disease involving the ostial and mid LAD which was treated with PCI in a staged fashion.   Following this, she continued to note unchanged shortness of breath as well as chest discomfort that was nitro responsive. She did wonder if some of her symptoms were related to underlying esophageal problems as her EGD had been on hold in the setting of her PCI in the summer 2021 and ongoing antiplatelet therapy. She underwent trial of outpatient diuresis for possible HFpEF component though when she was seen on 09/02/2020 her symptoms persisted. In this setting, she underwent diagnostic R/LHC on 09/06/2020 which showed mild  nonobstructive CAD with 40% ostial RCA stenosis as well as widely patent ostial/proximal and mid LAD stents with the distal LAD tapering to a small vessel. There were mildly elevated left heart and pulmonary artery pressures as well as severely elevated right heart filling pressure and low cardiac output/index consistent with HFpEF. Medical management with diuresis was recommended.  She comes in today accompanied by her husband and daughter.  She continues to feel about the same with exertional dyspnea and fatigue.  Since starting furosemide her weight is down 10 pounds from earlier this month, though symptoms are largely unchanged.  She does drink more than 2 L of liquid on a daily basis and consumes a diet high in sodium with frequent takeout, adding salt to food, and foods that are high in sodium.  She does note her abdomen feels distended which is typically where she holds onto her fluid.  She has stable two-pillow orthopnea.  There is some associated early satiety though she does have some underlying esophageal dysfunction which is being followed by GI.  She is quite sedentary at home, spending most of her time either laying or sitting in her bed however this is largely because of generalized weakness and unsteadiness when she is walking with fear of falling.  It is for this reason historically escalation of medical therapy has been limited.   Labs independently reviewed: 08/2020 - Hgb 13.7, PLT 277, potassium 4.1, BUN 15, serum creatinine 1.13 07/2020 - TSH normal, albumin 3.5, AST/ALT normal 05/2020 -TC 134, TG 128, HDL 60, LDL 48, A1c 8.5  Past Medical History:  Diagnosis  Date  . Abnormality of gait 03/25/2013  . Adrenal mass, left ([place])   . Anemia    iron deficient post  2 unit txfsn 2009, normal endo/colonoscopy by [facility]  . Arthritis   . Atypical chest pain    a. 04/1986 Cath ([facility]): nl cors, EF 65%; b. 07/2012 Cath [facility]): LM nl, LAD/LCX/RCA w/ min irregs; c. 07/2016 MV [facility]): "Equivocal" w/  small inf wall rev defect and hyperdynamic LV contraction, EF 87%; d. 08/2016 Cardiac CT Ca2+ score [facility]): Ca2+ score 1548; e. 05/2019 MV: No ischemia. Diaph atten. EF 75%.  . Cervical spinal stenosis 1994   due to trauma to back ([employer] accident), has intermittent paralysis and parasthesias  . Cervicogenic headache 03/23/2014  . Depression   . Diastolic dysfunction    a. 12/2019 Echo: EF 65-70%, Gr2 DD. No significant valvular dzs.  . Dizziness    chronic dizziness  . DJD (degenerative joint disease)    a. Chronic R shoulder pain pending R shoulder replacement 07/2017.  [name] Esophageal stenosis March 2011   with transietn outlet obstruction by food, cleared by EGD   . Family history of adverse reaction to anesthesia    daughter PONV  . Gastric bypass status for obesity   . Headache(784.0)   . Hypertension   . IBS (irritable bowel syndrome)   . Left bundle branch block (LBBB)    a. Intermittently present - likely rate related. - patient denies  . Obesity   . Obstructive sleep apnea    using CPAP  . 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 Restless leg syndrome   . Rotator cuff arthropathy, right 08/13/2017  . Syncope and collapse 03/12/2014  . Type II diabetes mellitus ([place])     Past Surgical History:  Procedure Laterality Date  . ABDOMINAL HYSTERECTOMY    . APPENDECTOMY    . CARDIAC CATHETERIZATION    . COLONOSCOPY    . CORONARY STENT INTERVENTION N/A 01/04/2020   Procedure: CORONARY STENT INTERVENTION;  Surgeon: [name], MD;  Location: [place] [facility];  Service: Cardiovascular;  Laterality: N/A;  LAD   . DIAPHRAGMATIC HERNIA REPAIR  2015  . ESOPHAGEAL DILATION     multiple  . ESOPHAGOGASTRODUODENOSCOPY    . [place]  . GALLBLADDER SURGERY  resection  . GASTRIC BYPASS    . GASTRIC BYPASS  2000, 2005   Dr. Debroah Loop  . [facility] IMPLANT PLACEMENT  April 2013   [place]  . JOINT REPLACEMENT  2007   bilateral knee. [name],  [name]  . LEFT  HEART CATH AND CORS/GRAFTS ANGIOGRAPHY N/A 12/31/2019   Procedure: LEFT HEART CATH AND CORS/GRAFTS ANGIOGRAPHY;  Surgeon: [name], MD;  Location: [place] [facility];  Service: Cardiovascular;  Laterality: N/A;  . PANNICULECTOMY  06/16/2019  . PANNICULECTOMY N/A 06/16/2019   Procedure: PANNICULECTOMY;  Surgeon: [name], MD;  Location: [place];  Service: Plastics;  Laterality: N/A;  3 hours, please  . REVERSE SHOULDER ARTHROPLASTY Right 08/13/2017   Procedure: REVERSE RIGHT SHOULDER ARTHROPLASTY;  Surgeon: [name], MD;  Location: [place];  Service: Orthopedics;  Laterality: Right;  . RIGHT/LEFT HEART CATH AND CORONARY ANGIOGRAPHY N/A 09/06/2020   Procedure: RIGHT/LEFT HEART CATH AND CORONARY ANGIOGRAPHY;  Surgeon: [name], MD;  Location: [place] [facility];  Service: Cardiovascular;  Laterality: N/A;  . ROTATOR CUFF REPAIR     right  . [place]  . TOTAL ABDOMINAL HYSTERECTOMY W/ BILATERAL SALPINGOOPHORECTOMY  3244  . UMBILICAL HERNIA REPAIR  Aug 11, 2015    Current Medications: Current Meds  Medication Sig  . albuterol (VENTOLIN HFA) 108 (90 Base) MCG/ACT inhaler Inhale 2 puffs into the lungs every 8 (eight) hours as needed for wheezing or shortness of breath.  [name] aspirin EC 81 MG tablet Take 1 tablet (81 mg total) by mouth daily.  [name] atorvastatin (LIPITOR) 40 MG tablet Take 1 tablet (40 mg total) by mouth daily.  [name] buPROPion (WELLBUTRIN) 75 MG tablet Take 75 mg by mouth 2 (two) times daily.  . butalbital-acetaminophen-caffeine (FIORICET) 50-325-40 MG tablet TAKE 1 TABLET BY MOUTH EVERY 6 HOURS AS NEEDED  . citalopram (CELEXA) 10 MG tablet Take 1 tablet (10 mg total) by mouth daily.  . clopidogrel (PLAVIX) 75 MG tablet Take 1 tablet (75 mg total) by mouth daily.  . clotrimazole-betamethasone (LOTRISONE) cream Apply 1 application topically 2 (two) times daily as needed.  . Continuous Blood Gluc Receiver (FREESTYLE LIBRE 2 READER) DEVI Use to  check sugar at least TID  . Continuous Blood Gluc Sensor (FREESTYLE LIBRE 14 DAY SENSOR) MISC Use to check sugar at least 3 times daily  . cyanocobalamin (,VITAMIN B-12,) 1000 MCG/ML injection Inject 1 mL (1,000 mcg total) into the muscle every 30 (thirty) days. Inject 1 ml (1000 mcg ) IM weekly x 4,  Then monthly thereafter  . dapagliflozin propanediol (FARXIGA) 10 MG TABS tablet Take 1 tablet (10 mg total) by mouth daily before breakfast.  . diazepam (VALIUM) 5 MG tablet Take 1 tablet (5 mg total) by mouth at bedtime as needed for anxiety.  . diphenhydrAMINE (BENADRYL) 25 MG tablet Take 50 mg by mouth daily as needed for allergies.   . diphenoxylate-atropine (LOMOTIL) 2.5-0.025 MG tablet Take 1 tablet by mouth 4 (four) times daily as needed for diarrhea or loose stools.  . Eszopiclone 3 MG TABS Take 1 tablet (3 mg total) by mouth at bedtime.  . hyoscyamine (ANASPAZ) 0.125 MG TBDP disintergrating tablet DISSOLVE UNDER TONGUE 15 MINUTES BEFORE MEALS  . insulin aspart (NOVOLOG FLEXPEN) 100 UNIT/ML FlexPen Inject 8 units up to 3 times daily before meals  . Insulin Glargine (LANTUS SOLOSTAR) 100 UNIT/ML Solostar Pen Inject 11 Units into the skin daily.  . Insulin Pen Needle (DROPLET PEN NEEDLES) 31G X 5 MM MISC USE ONE NEEDLE SUBCUTANEOUSLY AS DIRECTED (REMOVE AND DISCARD NEEDLE IN SHARPS CONTAINER IMMEDIATELY AFTER USE)  . Insulin Syringe-Needle U-100 (INSULIN SYRINGE 1CC/30GX5/16") 30G X 5/16" 1 ML MISC 1 Stick by Does not apply route 3 (three) times daily as needed. [id number]  . ipratropium (ATROVENT) 0.06 % nasal spray Place 2 sprays into both nostrils 4 (four) times daily as needed for rhinitis.  [name] loperamide (IMODIUM) 2 MG capsule Take by mouth as needed.  . nitroGLYCERIN (NITROSTAT) 0.4 MG SL tablet Place 1 tablet (0.4 mg total) under the tongue every 5 (five) minutes as needed for chest pain.  . NONFORMULARY OR COMPOUNDED [place]  Combination Pain Cream -  Baclofen 2%, Doxepin  5%, Gabapentin 6%, Topiramate 2%, Pentoxifylline 3% Apply 1-2 grams to affected area 3-4 times daily Qty. 120 gm 3 refills  . nystatin (MYCOSTATIN/NYSTOP) powder Apply 1 g topically 4 (four) times daily as needed (rash).   . potassium chloride SA (KLOR-CON) 20 MEQ tablet Take 1 tablet (20 mEq total) by mouth daily.  . pregabalin (LYRICA) 100 MG capsule Take 100 mg by mouth at bedtime as needed (leg cramps).   . torsemide (DEMADEX) 20  MG tablet Take 2 tablets (40 mg total) by mouth daily for 3 days, THEN 1 tablet (20 mg total) daily for 27 days.  . traMADol (ULTRAM) 50 MG tablet Take 0.5 tablets (25 mg total) by mouth every 12 (twelve) hours as needed for severe pain.  [name] umeclidinium-vilanterol (ANORO ELLIPTA) 62.5-25 MCG/INH AEPB Inhale 1 puff into the lungs daily.  . [DISCONTINUED] furosemide (LASIX) 40 MG tablet Take 1 tablet (40 mg total) by mouth daily.    Allergies:   Demeclocycline, Erythromycin, Flagyl [metronidazole], Glucophage [metformin hcl], Tetracyclines & related, Diovan [valsartan], Sulfa antibiotics, and Xanax [alprazolam]   Social History   Socioeconomic History  . Marital status: Married    Spouse name: [name]   . Number of children: 2  . Years of education: College   . Highest education level: Not on file  Occupational History    Employer: retired  Tobacco Use  . Smoking status: Never Smoker  . Smokeless tobacco: Never Used  Vaping Use  . Vaping Use: Never used  Substance and Sexual Activity  . Alcohol use: No  . Drug use: No  . Sexual activity: Not Currently    Birth control/protection: Post-menopausal, Surgical  Other Topics Concern  . Not on file  Social History Narrative   Patient lives at home with husband [name]. Patient has 2 children. Patient has [employer]. Patient is on disability; retd RN; Patient is right handed.    Social Determinants of Health   Financial Resource Strain: Low Risk   . Difficulty of Paying Living Expenses: Not hard at all   Food Insecurity: Not on file  Transportation Needs: No Transportation Needs  . Lack of Transportation (Medical): No  . Lack of Transportation (Non-Medical): No  Physical Activity: Not on file  Stress: Stress Concern Present  . Feeling of Stress : Rather much  Social Connections: Not on file     Family History:  The patient's family history includes Depression in her mother; Diabetes in her daughter; Headache in her mother; Heart disease in her daughter, father, and mother; Hypertension in her daughter, father, mother, and son; Osteoporosis in her father; Prostate cancer in her father; Stroke in her father and mother; Thyroid disease in her mother.  ROS:   Review of Systems  Constitutional: Positive for malaise/fatigue. Negative for chills, diaphoresis, fever and weight loss.  HENT: Negative for congestion.   Eyes: Negative for discharge and redness.  Respiratory: Positive for shortness of breath. Negative for cough, sputum production and wheezing.   Cardiovascular: Positive for orthopnea. Negative for chest pain, palpitations, claudication, leg swelling and PND.  Gastrointestinal: Negative for abdominal pain, heartburn, nausea and vomiting.       Abdominal distention Dysphagia  Musculoskeletal: Negative for falls and myalgias.  Skin: Negative for rash.  Neurological: Positive for weakness. Negative for dizziness, tingling, tremors, sensory change, speech change, focal weakness and loss of consciousness.  Endo/Heme/Allergies: Does not bruise/bleed easily.  Psychiatric/Behavioral: Negative for substance abuse. The patient is not nervous/anxious.   All other systems reviewed and are negative.    EKGs/Labs/Other Studies Reviewed:    Studies reviewed were summarized above. The additional studies were reviewed today:  [facility] 09/06/2020: Conclusions: 1. Non-obstructive coronary artery disease with 40% ostial RCA stenosis. 2. Widely patient ostial/proximal and mid LAD stents, with the  distal LAD tapering to a small vessel. 3. Mildly elevated left heart and pulmonary artery pressures. 4. Severely elevated right heart filling pressures. 5. Low Fick cardiac output/index consistent with HFpEF.  Recommendations:  1. Continue gentle diuresis. 2. Encourage weight loss and exercise. 3. Consider outpatient referral to advanced heart failure clinic for further evaluation/management of chronic HFpEF. 4. Continue secondary prevention of coronary artery disease. __________  2D echo 12/2019: 1. Left ventricular ejection fraction, by estimation, is 65 to 70%. The  left ventricle has normal function. The left ventricle has no regional  wall motion abnormalities. Left ventricular diastolic parameters are  consistent with Grade II diastolic  dysfunction (pseudonormalization).  2. Right ventricular systolic function is normal. The right ventricular  size is normal.  3. The mitral valve is normal in structure. No evidence of mitral valve  regurgitation. No evidence of mitral stenosis.  4. The aortic valve is normal in structure. Aortic valve regurgitation is  not visualized. No aortic stenosis is present. __________  [facility] 12/2019: Coronary angiography:  Coronary dominance: Right  Left mainstem: Large vessel that bifurcates into the LAD and left circumflex, no significant disease noted  Left anterior descending (LAD): Large vessel that extends to the apical region, diagonal branch 2 of moderate size, 65% ostial/proximal LAD disease/regions of haziness, 80% mid LAD disease  Left circumflex (LCx): Large vessel with OM branch 2, no significant disease noted  Right coronary artery (RCA): Right dominant vessel with PL and PDA, no significant disease noted  Left ventriculography: Left ventricular systolic function is normal, LVEF is estimated at 55-65%, there is no significant mitral regurgitation , no significant aortic valve stenosis  Final Conclusions:  Moderate  ostial/proximal LAD disease Severe mid LAD disease Normal ejection fraction  Recommendations:  Difficult decision whether to proceed with aggressive intervention Recent negative stress test, symptoms with some typical and atypical features She is relatively immobile, she does report some dysphagia symptoms, unclear if she is appreciating more GI symptoms Recommend starting isosorbide 30 mg daily Images discussed with Dr. Saunders Revel -We will need to consider PCI of the mid LAD, consider FFR of ostial LAD if she continues to have anginal symptoms. Would recommend this be done at [facility], consider radial access given right groin access very difficult __________  PCI 12/2019:  Colon Flattery LAD to Prox LAD lesion is 80% stenosed.  Post intervention, there is a 0% residual stenosis.  Post intervention, there is a 0% residual stenosis.  A drug-eluting stent was successfully placed using a STENT RESOLUTE ONYX 3.5X12.  Mid LAD lesion is 80% stenosed.  A drug-eluting stent was successfully placed using a [place] [id number].  Successful PCI and drug-eluting stent placement to mid as well as ostial LAD.  Recommendations: Dual antiplatelet therapy for at least 6 months but should consider long-term dual antiplatelet therapy given that the ostial LAD stent extends a millimeter into the left main coronary artery Aggressive treatment of risk factors. Likely discharge home tomorrow if no issues.   EKG:  EKG is ordered today.  The EKG ordered today demonstrates NSR, 93 bpm, no acute ST-T changes  Recent Labs: 07/17/2020: ALT 12; TSH 3.695 08/31/2020: BUN 15; Creatinine, Ser 1.13; Hemoglobin 13.7; Platelets 277; Potassium 4.1; Sodium 140  Recent Lipid Panel    Component Value Date/Time   CHOL 134 05/24/2020 1532   CHOL 155 02/27/2014 0420   TRIG 128.0 05/24/2020 1532   TRIG 121 02/27/2014 0420   HDL 60.40 05/24/2020 1532   HDL 43 02/27/2014 0420   CHOLHDL 2 05/24/2020 1532   VLDL 25.6  05/24/2020 1532   VLDL 24 02/27/2014 0420   LDLCALC 48 05/24/2020 1532   LDLCALC 88 02/27/2014 0420   LDLDIRECT  111.0 06/16/2015 0936    PHYSICAL EXAM:    VS:  BP 120/78 (BP Location: Left Arm, Patient Position: Sitting, Cuff Size: Normal)   Pulse 93   Ht 5' 4.5" (1.638 m)   Wt 250 lb (113.4 kg)   SpO2 96%   BMI 42.25 kg/m   BMI: Body mass index is 42.25 kg/m.  Physical Exam Vitals reviewed.  Constitutional:      Appearance: She is well-developed and well-nourished.  HENT:     Head: Normocephalic and atraumatic.  Eyes:     General:        Right eye: No discharge.        Left eye: No discharge.  Neck:     Comments: JVD difficult to assess secondary to body habitus Cardiovascular:     Rate and Rhythm: Normal rate and regular rhythm.     Pulses: No midsystolic click and no opening snap.          Posterior tibial pulses are 2+ on the right side and 2+ on the left side.     Heart sounds: Normal heart sounds, S1 normal and S2 normal. Heart sounds not distant. No murmur heard. No friction rub.  Pulmonary:     Effort: Pulmonary effort is normal. No respiratory distress.     Breath sounds: Decreased breath sounds present. No wheezing or rales.  Chest:     Chest wall: No tenderness.  Abdominal:     General: There is distension.     Palpations: Abdomen is soft.     Tenderness: There is no abdominal tenderness.  Musculoskeletal:     Cervical back: Normal range of motion.     Right lower leg: Edema present.     Left lower leg: Edema present.     Comments: Trace bilateral pretibial edema  Skin:    General: Skin is warm and dry.     Nails: There is no clubbing or cyanosis.  Neurological:     Mental Status: She is alert and oriented to person, place, and time.  Psychiatric:        Mood and Affect: Mood and affect normal.        Speech: Speech normal.        Behavior: Behavior normal.        Thought Content: Thought content normal.        Judgment: Judgment normal.      Wt Readings from Last 3 Encounters:  09/09/20 250 lb (113.4 kg)  09/06/20 250 lb (113.4 kg)  09/02/20 251 lb (113.9 kg)     ASSESSMENT & PLAN:   1. CAD involving the native coronary arteries without angina: Recent LHC on 09/06/2020 showed nonobstructive CAD with patent stents along the LAD with continued medical therapy being advised.  Continue DAPT with ASA and clopidogrel without interruption through at least 12 months dating back to date of original PCI in 12/2019.  Continue risk factor modification and secondary prevention with atorvastatin and as needed SL NTG.  Cardiac risk ratification for potential EGD continues to be deferred in the setting of acute on chronic HFpEF.  2. Acute on chronic HFpEF: Suspect acute on chronic diastolic CHF is the main contributing factor to her underlying dyspnea though there is also a degree of obesity with physical deconditioning contributing as well.  She has NYHA class III symptoms.  RHC demonstrated mildly elevated left heart and pulmonary artery pressures with severely elevated right heart filling pressures with a low cardiac output and index consistent  with HFpEF.  Transition furosemide to torsemide 40 mg daily for 3 days followed by 20 mg daily thereafter.  She does consume a significant amount of liquid and a diet consisting of high salt intake.  We had a long discussion with her, her husband, and her daughter regarding the needed dietary changes.  It was discussed there can be some degree of renal dysfunction associated with diuresis of right heart pressures.  She will follow-up in 1 week for labs to check a BMP and I will see her again in clinic 1 week after that (2 weeks from today).  Look to further escalate medical therapy with possible addition of MRA moving forward.  Continue SGLT2 inhibitor.  CHF education.  3. HTN: Blood pressure is well controlled in the office today.  Continue current medical therapy as outlined above.  4. Iron deficiency  anemia: Stable on last check.  Follow-up with PCP as directed.  This was not discussed in detail at today's visit.  5. Morbid obesity with OSA: Weight loss is advised through heart healthy diet and regular exercise.  Disposition: F/u with Dr. Saunders Revel or an APP in 2 weeks.   Medication Adjustments/Labs and Tests Ordered: Current medicines are reviewed at length with the patient today.  Concerns regarding medicines are outlined above. Medication changes, Labs and Tests ordered today are summarized above and listed in the Patient Instructions accessible in Encounters.   Signed, Christell Faith, PA-C 09/09/2020 4:21 PM     [address] [place], [place] [address] [phone]

## 2020-09-08 ENCOUNTER — Ambulatory Visit: Payer: Medicare Other | Admitting: Podiatry

## 2020-09-09 ENCOUNTER — Encounter: Payer: Self-pay | Admitting: Physician Assistant

## 2020-09-09 ENCOUNTER — Ambulatory Visit: Payer: Medicare Other | Admitting: Physician Assistant

## 2020-09-09 ENCOUNTER — Other Ambulatory Visit: Payer: Self-pay

## 2020-09-09 ENCOUNTER — Ambulatory Visit (INDEPENDENT_AMBULATORY_CARE_PROVIDER_SITE_OTHER): Payer: Medicare Other | Admitting: Physician Assistant

## 2020-09-09 VITALS — BP 120/78 | HR 93 | Ht 64.5 in | Wt 250.0 lb

## 2020-09-09 DIAGNOSIS — I2510 Atherosclerotic heart disease of native coronary artery without angina pectoris: Secondary | ICD-10-CM | POA: Diagnosis not present

## 2020-09-09 DIAGNOSIS — I10 Essential (primary) hypertension: Secondary | ICD-10-CM | POA: Diagnosis not present

## 2020-09-09 DIAGNOSIS — I5033 Acute on chronic diastolic (congestive) heart failure: Secondary | ICD-10-CM | POA: Diagnosis not present

## 2020-09-09 DIAGNOSIS — D509 Iron deficiency anemia, unspecified: Secondary | ICD-10-CM

## 2020-09-09 DIAGNOSIS — I25119 Atherosclerotic heart disease of native coronary artery with unspecified angina pectoris: Secondary | ICD-10-CM

## 2020-09-09 MED ORDER — TORSEMIDE 20 MG PO TABS: ORAL_TABLET | ORAL | 0 refills | Status: DC

## 2020-09-09 NOTE — Patient Instructions (Addendum)
Medication Instructions:  Your physician has recommended you make the following change in your medication:   1. STOP Furosemide 2. START Torsemide 20 mg take 2 tablets (40 mg) once a day for 3 days then go to one tablet (20 mg) once daily   *If you need a refill on your cardiac medications before your next appointment, please call your pharmacy*   Lab Work: BMET in one week  Go to [facility] entrance and check in at registration. No appointment is needed for this.   If you have labs (blood work) drawn today and your tests are completely normal, you will receive your results only by: [name] MyChart Message (if you have MyChart) OR . A paper copy in the mail If you have any lab test that is abnormal or we need to change your treatment, we will call you to review the results.   Testing/Procedures: None   Follow-Up: At [facility], you and your health needs are our priority.  As part of our continuing mission to provide you with exceptional heart care, we have created designated Provider Care Teams.  These Care Teams include your primary Cardiologist (physician) and Advanced Practice Providers (APPs -  Physician Assistants and Nurse Practitioners) who all work together to provide you with the care you need, when you need it.   Your next appointment:   2 week(s)  The format for your next appointment:   In Person  Provider:   [name], MD or Christell Faith, PA-C

## 2020-09-12 ENCOUNTER — Other Ambulatory Visit: Payer: Self-pay

## 2020-09-12 ENCOUNTER — Telehealth: Payer: Self-pay | Admitting: Internal Medicine

## 2020-09-12 MED ORDER — DAPAGLIFLOZIN PROPANEDIOL 10 MG PO TABS: 10.0000 mg | ORAL_TABLET | Freq: Every day | ORAL | 1 refills | Status: DC

## 2020-09-12 MED ORDER — CITALOPRAM HYDROBROMIDE 10 MG PO TABS: 10.0000 mg | ORAL_TABLET | Freq: Every day | ORAL | 1 refills | Status: DC

## 2020-09-12 MED ORDER — ESZOPICLONE 3 MG PO TABS: 3.0000 mg | ORAL_TABLET | Freq: Every day | ORAL | 1 refills | Status: DC

## 2020-09-12 NOTE — Telephone Encounter (Signed)
[facility] would not electronically send to [facility]  rx printed

## 2020-09-12 NOTE — Addendum Note (Signed)
Addended by: Thressa Sheller on: 09/12/2020 08:21 AM   Modules accepted: Orders

## 2020-09-12 NOTE — Addendum Note (Signed)
Addended by: Thressa Sheller on: 09/12/2020 08:19 AM   Modules accepted: Orders

## 2020-09-13 ENCOUNTER — Ambulatory Visit (INDEPENDENT_AMBULATORY_CARE_PROVIDER_SITE_OTHER): Payer: Medicare Other | Admitting: Neurology

## 2020-09-13 ENCOUNTER — Other Ambulatory Visit: Payer: Self-pay

## 2020-09-13 ENCOUNTER — Encounter: Payer: Self-pay | Admitting: Neurology

## 2020-09-13 VITALS — BP 143/78 | HR 91 | Ht 64.0 in | Wt 239.3 lb

## 2020-09-13 DIAGNOSIS — M542 Cervicalgia: Secondary | ICD-10-CM | POA: Diagnosis not present

## 2020-09-13 DIAGNOSIS — E1142 Type 2 diabetes mellitus with diabetic polyneuropathy: Secondary | ICD-10-CM | POA: Diagnosis not present

## 2020-09-13 DIAGNOSIS — R519 Headache, unspecified: Secondary | ICD-10-CM

## 2020-09-13 DIAGNOSIS — R269 Unspecified abnormalities of gait and mobility: Secondary | ICD-10-CM | POA: Diagnosis not present

## 2020-09-13 DIAGNOSIS — G4486 Cervicogenic headache: Secondary | ICD-10-CM

## 2020-09-13 DIAGNOSIS — I25119 Atherosclerotic heart disease of native coronary artery with unspecified angina pectoris: Secondary | ICD-10-CM

## 2020-09-13 MED ORDER — PREGABALIN 50 MG PO CAPS: 50.0000 mg | ORAL_CAPSULE | Freq: Two times a day (BID) | ORAL | 2 refills | Status: DC

## 2020-09-13 NOTE — Telephone Encounter (Signed)
Placed in quick sign folder.  

## 2020-09-13 NOTE — Progress Notes (Signed)
Reason for visit: Headache  Referring physician: Dr. Donita Brooks is a 75 y.o. female  History of present illness:  Denise Sharp is a 75 year old right-handed white female with a history of multiple medical issues.  The patient has diabetes and morbid obesity, she has a severe peripheral neuropathy associated with this that has resulted in a significant gait instability problem.  The patient has recently converted to using a walker for ambulation, she has reported several recent falls, she may bump her head with the following events.  She has had longstanding problems with low back pain and neck pain, and cervicogenic headache.  She has had daily headaches over the last 2 or 3 years.  She recently was found to have problems with elevated right heart pressures, she has chronic shortness of breath with this.  She reports that her headaches are bifrontal but may be associated with pain in the back of the head when the neck is hurting.  She reports dizziness as well.  She has undergone physical therapy for gait instability without benefit in the past.  She has a history of vitamin B12 deficiency but she is on B12 supplementation.  She reports some chronic issues with memory that have been present since an episode of transient coma in 2019.  Due to the ongoing headaches, she is sent to this office for an evaluation.  She has Lyrica that she takes at home but she has not been taking it recently.  She does have Fioricet that she takes if needed but states that she may take it no more than once a week.  Past Medical History:  Diagnosis Date  . Abnormality of gait 03/25/2013  . Adrenal mass, left ([place])   . Anemia    iron deficient post  2 unit txfsn 2009, normal endo/colonoscopy by [facility]  . Arthritis   . Atypical chest pain    a. 04/1986 Cath ([facility]): nl cors, EF 65%; b. 07/2012 Cath [facility]): LM nl, LAD/LCX/RCA w/ min irregs; c. 07/2016 MV [facility]): "Equivocal" w/ small inf wall rev defect and  hyperdynamic LV contraction, EF 87%; d. 08/2016 Cardiac CT Ca2+ score [facility]): Ca2+ score 1548; e. 05/2019 MV: No ischemia. Diaph atten. EF 75%.  . Cervical spinal stenosis 1994   due to trauma to back ([employer] accident), has intermittent paralysis and parasthesias  . Cervicogenic headache 03/23/2014  . Depression   . Diastolic dysfunction    a. 12/2019 Echo: EF 65-70%, Gr2 DD. No significant valvular dzs.  . Dizziness    chronic dizziness  . DJD (degenerative joint disease)    a. Chronic R shoulder pain pending R shoulder replacement 07/2017.  [name] Esophageal stenosis March 2011   with transietn outlet obstruction by food, cleared by EGD   . Family history of adverse reaction to anesthesia    daughter PONV  . Gastric bypass status for obesity   . Headache(784.0)   . Hypertension   . IBS (irritable bowel syndrome)   . Left bundle branch block (LBBB)    a. Intermittently present - likely rate related. - patient denies  . Obesity   . Obstructive sleep apnea    using CPAP  . 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 Restless leg syndrome   . Rotator cuff arthropathy, right 08/13/2017  . Syncope and collapse 03/12/2014  . Type II diabetes mellitus ([place])     Past Surgical History:  Procedure Laterality Date  . ABDOMINAL HYSTERECTOMY    . APPENDECTOMY    .  CARDIAC CATHETERIZATION    . COLONOSCOPY    . CORONARY STENT INTERVENTION N/A 01/04/2020   Procedure: CORONARY STENT INTERVENTION;  Surgeon: [name], MD;  Location: [place] [facility];  Service: Cardiovascular;  Laterality: N/A;  LAD   . DIAPHRAGMATIC HERNIA REPAIR  2015  . ESOPHAGEAL DILATION     multiple  . ESOPHAGOGASTRODUODENOSCOPY    . [place]  . GALLBLADDER SURGERY  resection  . GASTRIC BYPASS    . GASTRIC BYPASS  2000, 2005   Dr. Debroah Loop  . [facility] IMPLANT PLACEMENT  April 2013   [place]  . JOINT REPLACEMENT  2007   bilateral knee. [name],  [name]  . LEFT HEART CATH AND CORS/GRAFTS  ANGIOGRAPHY N/A 12/31/2019   Procedure: LEFT HEART CATH AND CORS/GRAFTS ANGIOGRAPHY;  Surgeon: [name], MD;  Location: [place] [facility];  Service: Cardiovascular;  Laterality: N/A;  . PANNICULECTOMY  06/16/2019  . PANNICULECTOMY N/A 06/16/2019   Procedure: PANNICULECTOMY;  Surgeon: [name], MD;  Location: [place];  Service: Plastics;  Laterality: N/A;  3 hours, please  . REVERSE SHOULDER ARTHROPLASTY Right 08/13/2017   Procedure: REVERSE RIGHT SHOULDER ARTHROPLASTY;  Surgeon: [name], MD;  Location: [place];  Service: Orthopedics;  Laterality: Right;  . RIGHT/LEFT HEART CATH AND CORONARY ANGIOGRAPHY N/A 09/06/2020   Procedure: RIGHT/LEFT HEART CATH AND CORONARY ANGIOGRAPHY;  Surgeon: [name], MD;  Location: [place] [facility];  Service: Cardiovascular;  Laterality: N/A;  . ROTATOR CUFF REPAIR     right  . [place]  . TOTAL ABDOMINAL HYSTERECTOMY W/ BILATERAL SALPINGOOPHORECTOMY  1974  . UMBILICAL HERNIA REPAIR  Aug 11, 2015    Family History  Problem Relation Age of Onset  . Heart disease Father   . Hypertension Father   . Prostate cancer Father   . Stroke Father   . Osteoporosis Father   . Stroke Mother   . Depression Mother   . Headache Mother   . Heart disease Mother   . Thyroid disease Mother   . Hypertension Mother   . Diabetes Daughter   . Heart disease Daughter   . Hypertension Daughter   . Hypertension Son     Social history:  reports that she has never smoked. She has never used smokeless tobacco. She reports that she does not drink alcohol and does not use drugs.  Medications:  Prior to Admission medications   Medication Sig Start Date End Date Taking? Authorizing Provider  albuterol (VENTOLIN HFA) 108 (90 Base) MCG/ACT inhaler Inhale 2 puffs into the lungs every 8 (eight) hours as needed for wheezing or shortness of breath. 08/07/19  Yes [name], MD  aspirin EC 81 MG tablet Take 1 tablet (81 mg total) by  mouth daily. 01/05/20  Yes Fritzi Mandes, MD  atorvastatin (LIPITOR) 40 MG tablet Take 1 tablet (40 mg total) by mouth daily. 03/07/20  Yes Dunn, Areta Haber, PA-C  buPROPion (WELLBUTRIN) 75 MG tablet Take 75 mg by mouth 2 (two) times daily.   Yes [provider]  butalbital-acetaminophen-caffeine (FIORICET) 50-325-40 MG tablet TAKE 1 TABLET BY MOUTH EVERY 6 HOURS AS NEEDED 05/25/20  Yes [name], MD  citalopram (CELEXA) 10 MG tablet Take 1 tablet (10 mg total) by mouth daily. 09/12/20  Yes [name], MD  clopidogrel (PLAVIX) 75 MG tablet Take 1 tablet (75 mg total) by mouth daily. 03/07/20  Yes Rise Mu, PA-C  clotrimazole-betamethasone (  LOTRISONE) cream Apply 1 application topically 2 (two) times daily as needed.   Yes [provider]  Continuous Blood Gluc Receiver (FREESTYLE LIBRE 2 READER) DEVI Use to check sugar at least TID 06/23/20  Yes [name], MD  Continuous Blood Gluc Sensor (FREESTYLE LIBRE 14 DAY SENSOR) MISC Use to check sugar at least 3 times daily 06/23/20  Yes [name], MD  cyanocobalamin (,VITAMIN B-12,) 1000 MCG/ML injection Inject 1 mL (1,000 mcg total) into the muscle every 30 (thirty) days. Inject 1 ml (1000 mcg ) IM weekly x 4,  Then monthly thereafter 07/29/20  Yes [name], MD  dapagliflozin propanediol (FARXIGA) 10 MG TABS tablet Take 1 tablet (10 mg total) by mouth daily before breakfast. 09/12/20  Yes [name], MD  diazepam (VALIUM) 5 MG tablet Take 1 tablet (5 mg total) by mouth at bedtime as needed for anxiety. 05/27/20  Yes [name], MD  diphenhydrAMINE (BENADRYL) 25 MG tablet Take 50 mg by mouth daily as needed for allergies.    Yes [provider]  diphenoxylate-atropine (LOMOTIL) 2.5-0.025 MG tablet Take 1 tablet by mouth 4 (four) times daily as needed for diarrhea or loose stools. 03/24/20  Yes [name], MD  Eszopiclone 3 MG TABS Take 1 tablet (3 mg total) by mouth at bedtime. 09/12/20  Yes [name], MD  hyoscyamine (ANASPAZ) 0.125 MG TBDP disintergrating tablet DISSOLVE UNDER TONGUE 15 MINUTES BEFORE MEALS 04/21/20  Yes [name], MD  insulin aspart (NOVOLOG FLEXPEN) 100 UNIT/ML FlexPen Inject 8 units up to 3 times daily before meals 06/23/20  Yes [name], MD  Insulin Glargine (LANTUS SOLOSTAR) 100 UNIT/ML Solostar Pen Inject 11 Units into the skin daily. 03/31/19  Yes [name], MD  Insulin Pen Needle (DROPLET PEN NEEDLES) 31G X 5 MM MISC USE ONE NEEDLE SUBCUTANEOUSLY AS DIRECTED (REMOVE AND DISCARD NEEDLE IN SHARPS CONTAINER IMMEDIATELY AFTER USE) 06/13/20  Yes [name], MD  Insulin Syringe-Needle U-100 (INSULIN SYRINGE 1CC/30GX5/16") 30G X 5/16" 1 ML MISC 1 Stick by Does not apply route 3 (three) times daily as needed. DX.E11.65 06/14/20  Yes [name], MD  ipratropium (ATROVENT) 0.06 % nasal spray Place 2 sprays into both nostrils 4 (four) times daily as needed for rhinitis.   Yes [provider]  loperamide (IMODIUM) 2 MG capsule Take by mouth as needed.   Yes [provider]  nitroGLYCERIN (NITROSTAT) 0.4 MG SL tablet Place 1 tablet (0.4 mg total) under the tongue every 5 (five) minutes as needed for chest pain. 01/05/20  Yes Fritzi Mandes, MD  NONFORMULARY OR COMPOUNDED [place]  Combination Pain Cream -  Baclofen 2%, Doxepin 5%, Gabapentin 6%, Topiramate 2%, Pentoxifylline 3% Apply 1-2 grams to affected area 3-4 times daily Qty. 120 gm 3 refills 10/14/17  Yes Gardiner Barefoot, DPM  nystatin (MYCOSTATIN/NYSTOP) powder Apply 1 g topically 4 (four) times daily as needed (rash).    Yes [provider]  potassium chloride SA (KLOR-CON) 20 MEQ tablet Take 1 tablet (20 mEq total) by mouth daily. 08/19/20  Yes End, Harrell Gave, MD  pregabalin (LYRICA) 100 MG capsule Take 100 mg by mouth at bedtime as needed (leg cramps).    Yes [provider]  torsemide (DEMADEX) 20 MG tablet Take 2 tablets (40 mg total) by mouth  daily for 3 days, THEN 1 tablet (20 mg total) daily for 27 days. 09/09/20 10/09/20 Yes Dunn, Areta Haber, PA-C  traMADol (ULTRAM) 50 MG tablet Take 0.5 tablets (25 mg total) by mouth every 12 (twelve) hours as needed for severe pain. 08/21/17  Yes Regalado, Belkys A, MD  umeclidinium-vilanterol (ANORO ELLIPTA) 62.5-25 MCG/INH AEPB Inhale 1 puff into the lungs daily. 05/24/20  Yes [name], MD      Allergies  Allergen Reactions  . Demeclocycline Hives  . Erythromycin Nausea And Vomiting and Other (See Comments)    Severe irritable bowel  . Flagyl [Metronidazole] Nausea And Vomiting and Other (See Comments)    Severe irritable bowel  . Glucophage [Metformin Hcl] Nausea And Vomiting and Other (See Comments)    "Sick" "I won't take anything that has metformin in it"  . Tetracyclines & Related Hives and Rash  . Diovan [Valsartan] Nausea Only       . Sulfa Antibiotics Rash and Other (See Comments)    As child  . Xanax [Alprazolam] Other (See Comments)    Unknown reaction    ROS:  Out of a complete 14 system review of symptoms, the patient complains only of the following symptoms, and all other reviewed systems are negative.  Headache Memory problems Walking difficulty Neck pain, low back pain Dizziness  Blood pressure (!) 143/78, pulse 91, height 5\' 4"  (1.626 m), weight 239 lb 4.8 oz (108.5 kg).  Physical Exam  General: The patient is alert and cooperative at the time of the examination.  The patient is markedly obese.  Eyes: Pupils are equal, round, and reactive to light. Discs are flat bilaterally.  Neck: The neck is supple, no carotid bruits are noted.  Respiratory: The respiratory examination is clear.  Cardiovascular: The cardiovascular examination reveals a regular rate and rhythm, no obvious murmurs or rubs are noted.  Skin: Extremities are with 1-2+ edema below the knees bilaterally.  Neurologic Exam  Mental status: The patient is alert and oriented x 3 at the time  of the examination. The patient has apparent normal recent and remote memory, with an apparently normal attention span and concentration ability.  Cranial nerves: Facial symmetry is present. There is good sensation of the face to pinprick and soft touch bilaterally. The strength of the facial muscles and the muscles to head turning and shoulder shrug are normal bilaterally. Speech is well enunciated, no aphasia or dysarthria is noted. Extraocular movements are full. Visual fields are full. The tongue is midline, and the patient has symmetric elevation of the soft palate. No obvious hearing deficits are noted.  Motor: The motor testing reveals 5 over 5 strength of all 4 extremities. Good symmetric motor tone is noted throughout.  Sensory: Sensory testing is intact to pinprick, soft touch, vibration sensation, and position sense on the upper extremities.  With the lower extremities there is a stocking pattern pinprick sensory deficit up to the knees bilaterally with significant impairment of vibration and position sense in both feet.  No evidence of extinction is noted.  Coordination: Cerebellar testing reveals good finger-nose-finger and heel-to-shin bilaterally.  Gait and station: Gait is slightly wide-based, the patient can walk short distances independently but usually uses a walker.  Tandem gait was not attempted.  Romberg is negative but is slightly unsteady.  Reflexes: Deep tendon reflexes are symmetric, but are somewhat brisk bilaterally, with exception of depression of ankle jerk reflexes bilaterally. Toes are downgoing bilaterally.   CT head 07/17/20:  IMPRESSION: No acute abnormality. Stable atrophy and chronic white matter Changes.  * CT scan images were reviewed online. I agree with the written  report.    Assessment/Plan:  1.  Chronic daily headache, cervicogenic headache  2.  Chronic low back pain  3.  Reports of memory disturbance  4.  Diabetic peripheral neuropathy  5.   Gait disturbance  6.  Chronic dizziness  The patient has recent been found to have elevations in right heart pressure which theoretically could cause headache.  The patient however has a longstanding history of neck pain and cervicogenic headache that likely still an issue.  On clinical examination today, the patient was noted to have brisk reflexes which appears to be a new finding from prior evaluations done several years ago.  For this reason, CT scan of the cervical spine will be done.  She cannot have MRI as she has an InterStim stimulator in place.  The patient will go back on Lyrica taking 50 mg twice daily.  If the CT of the cervical spine is unremarkable, I will get her in to physical therapy for neuromuscular therapy on the neck and shoulders.  She will follow up here in 4 months.  Jill Alexanders MD 09/13/2020 10:19 AM  [facility] [address] [place], [place] [address]  Phone [phone] Fax [phone]

## 2020-09-14 ENCOUNTER — Telehealth: Payer: Self-pay | Admitting: Internal Medicine

## 2020-09-14 ENCOUNTER — Other Ambulatory Visit: Payer: Self-pay | Admitting: Internal Medicine

## 2020-09-14 DIAGNOSIS — E1142 Type 2 diabetes mellitus with diabetic polyneuropathy: Secondary | ICD-10-CM

## 2020-09-14 MED ORDER — LANTUS SOLOSTAR 100 UNIT/ML ~~LOC~~ SOPN: 11.0000 [IU] | PEN_INJECTOR | Freq: Every day | SUBCUTANEOUS | 2 refills | Status: DC

## 2020-09-14 NOTE — Telephone Encounter (Signed)
Medication has been refilled. Also called and spoke with pt to let her know that [name] does agree with Dr. Marisue Humble changes. Pt gave a verbal understanding.

## 2020-09-14 NOTE — Telephone Encounter (Addendum)
Send to [facility] prescription for  Insulin Glargine (LANTUS SOLOSTAR) 100 UNIT/ML Solostar Pen  Patient is requesting that [name] Review her records from her last visit with Dr. [facility]. She stated he has made some medication changes and wants to know if you agree with the changes.

## 2020-09-14 NOTE — Telephone Encounter (Signed)
Yes I agree with the change from furosemide to torsemide, but I also agree with her need to start eating less take out foot that is high in sodium

## 2020-09-15 ENCOUNTER — Other Ambulatory Visit: Payer: Self-pay | Admitting: Nurse Practitioner

## 2020-09-15 ENCOUNTER — Other Ambulatory Visit
Admission: RE | Admit: 2020-09-15 | Discharge: 2020-09-15 | Disposition: A | Discharge status: Home / Self Care | Payer: Medicare Other | Attending: Physician Assistant | Admitting: Physician Assistant

## 2020-09-15 ENCOUNTER — Other Ambulatory Visit: Payer: Self-pay

## 2020-09-15 ENCOUNTER — Telehealth: Payer: Self-pay | Admitting: Neurology

## 2020-09-15 DIAGNOSIS — N179 Acute kidney failure, unspecified: Secondary | ICD-10-CM

## 2020-09-15 DIAGNOSIS — I2510 Atherosclerotic heart disease of native coronary artery without angina pectoris: Secondary | ICD-10-CM | POA: Insufficient documentation

## 2020-09-15 LAB — BASIC METABOLIC PANEL
Anion gap: 14 (ref 5–15)
BUN: 43 mg/dL — ABNORMAL HIGH (ref 8–23)
CO2: 22 mmol/L (ref 22–32)
Calcium: 9.1 mg/dL (ref 8.9–10.3)
Chloride: 100 mmol/L (ref 98–111)
Creatinine, Ser: 1.66 mg/dL — ABNORMAL HIGH (ref 0.44–1.00)
GFR, Estimated: 32 mL/min — ABNORMAL LOW (ref >60)
Glucose, Bld: 286 mg/dL — ABNORMAL HIGH (ref 70–99)
Potassium: 4.6 mmol/L (ref 3.5–5.1)
Sodium: 136 mmol/L (ref 135–145)

## 2020-09-15 NOTE — Telephone Encounter (Signed)
lvm for pt to call back to tell a time & day she liked  Medicare/[employer] supp/[place] no Josem Kaufmann

## 2020-09-20 ENCOUNTER — Other Ambulatory Visit
Admission: RE | Admit: 2020-09-20 | Discharge: 2020-09-20 | Disposition: A | Discharge status: Home / Self Care | Payer: Medicare Other | Source: Ambulatory Visit | Attending: Nurse Practitioner | Admitting: Nurse Practitioner

## 2020-09-20 DIAGNOSIS — N179 Acute kidney failure, unspecified: Secondary | ICD-10-CM

## 2020-09-20 LAB — BASIC METABOLIC PANEL
Anion gap: 10 (ref 5–15)
BUN: 30 mg/dL — ABNORMAL HIGH (ref 8–23)
CO2: 23 mmol/L (ref 22–32)
Calcium: 9 mg/dL (ref 8.9–10.3)
Chloride: 105 mmol/L (ref 98–111)
Creatinine, Ser: 1.47 mg/dL — ABNORMAL HIGH (ref 0.44–1.00)
GFR, Estimated: 37 mL/min — ABNORMAL LOW (ref >60)
Glucose, Bld: 146 mg/dL — ABNORMAL HIGH (ref 70–99)
Potassium: 3.9 mmol/L (ref 3.5–5.1)
Sodium: 138 mmol/L (ref 135–145)

## 2020-09-20 NOTE — Telephone Encounter (Signed)
Patient is scheduled at [facility] for Friday 09/23/20 to [facility] [place] 1:[facility] pm [facility]. The patient is aware of time & day.

## 2020-09-21 ENCOUNTER — Telehealth: Payer: Self-pay | Admitting: *Deleted

## 2020-09-21 ENCOUNTER — Telehealth: Payer: Self-pay | Admitting: Physician Assistant

## 2020-09-21 MED ORDER — TORSEMIDE 10 MG PO TABS: 10.0000 mg | ORAL_TABLET | Freq: Every day | ORAL | 1 refills | Status: DC

## 2020-09-21 NOTE — Progress Notes (Signed)
Cardiology Office Note    Date:  09/23/2020   ID:  Denise Sharp, DOB 08/10/1945, MRN [id number]  PCP:  [name], MD  Cardiologist:  [name], MD  Electrophysiologist:  None   Chief Complaint: Follow-up  History of Present Illness:   Denise Sharp is a 75 y.o. female with history of CAD s/p PCI to the ostial and mid LAD in the setting of unstable angina,HFpEF,DM2, HTN, iron deficiency anemia s/p gastric bypass x 2, IBS, esophageal stenosis, chronic dizziness with gait instability, cervicogenic headache with cervical spine stenosis, OSA on CPAP, RLS, and adrenal mass who presents for follow up ofCHF.  Prior ischemic evaluations in [phone] via cardiac cath revealed minor luminal irregularities. Nuclear stress testing in 05/2019 was low risk. More recently, she was seen in the office in 11/2019 with worsening exertional dyspnea. Echo in 12/2019 showed preserved LVEF 65 to 70%, no regional wall motion abnormalities, grade 2 diastolic dysfunction, normal RV systolic function and ventricular cavity size, and no significant valvular abnormalities.During the procedure, she complained of significant chest pain over the preceding 2 weeks and was sent to the ED and ultimately admitted. Subsequent diagnostic cardiac cath showed significant disease involving the ostial and mid LAD which was treated with PCI in a staged fashion. Following this, she continued to note unchanged shortness of breath as well as chest discomfort that was nitro responsive. She did wonder if some of her symptoms were related to underlying esophageal problems as her EGD had been on hold in the setting of her PCI in the summer 2021 and ongoing antiplatelet therapy. She underwent trial of outpatient diuresis for possible HFpEF component though when she was seen on 09/02/2020 her symptoms persisted. In this setting, she underwent diagnostic R/LHC on 09/06/2020 which showed mild nonobstructive CAD with 40%  ostial RCA stenosis as well as widely patent ostial/proximal and mid LAD stents with the distal LAD tapering to a small vessel. There were mildly elevated left heart and pulmonary artery pressures as well as severely elevated right heart filling pressure and low cardiac output/index consistent with HFpEF. Medical management with diuresis was recommended.   She was last seen in the office in 08/2020 and continued to feel the same with stable exertional dyspnea and fatigue.  Since starting torsemide her weight was down 10 pounds from her visit earlier in 08/2020, though her symptoms were largely unchanged.  It was noted she was drinking more than 2 L of liquid per day and consumed a diet high in sodium with frequent takeout food, adding salt to food, and eating foods that were generally high in sodium.  Her torsemide was briefly increased to 40 mg daily for 3 days followed by 20 mg daily thereafter.  With this she noted significant improvement in her weight with a weight trending around 243 pounds by her home scale though did have an increase in her renal function with a BUN of 43 and a serum creatinine 1.66 (baseline around 1.0-1.1).  Given this she was advised to hold torsemide for 1 day and resume reduced dose of 10 mg daily.  Follow-up labs on 3/8 showed improved renal function though this did remain above her baseline (creat 1.47).  She contacted our office on 3/9 noting a 4 pound weight gain overnight and increased fatigue after having a busy day the day prior.  It was noted she had consumed a diet high in sodium on 3/8.  It was also noted she was taking torsemide 20  mg daily rather than the recommended 10 mg daily.  Given her renal function remained elevated above baseline it was recommended she cut out sodium and take torsemide 10 mg daily as previously directed given mild AKI.  Today, she reports that her weight is down 3 lbs by our scale. She continues to have breathlessness when she is active at home  although she has only become more active over the past few days/week since her husband had surgery. Her daughter is with her today and reports that prior to a week ago, the patient would only walk to the bathroom and back to the bed. Her daughter has been pushing her to be more active. The patient called on Wednesday to report a  4 lb weight gain and mid-sternal chest heaviness since awakening that morning. She took 2 sl ntg and the pain subsided. She was advised to reduce torsemide to 10 mg daily in the setting of her recent bump in creatinine and scheduled for an appointment today.  She forgot to weigh herself at home yesterday or today but it seems that in comparison to our scale, that she is still 3 lbs over her dry weight. She has taken torsemide 10 mg yesterday and today. She does not note significant urine output except on the torsemide 40 mg. She feels that she is carrying additional weight in her abdomen but not in the tops of her feet, the 2 places she typically sees swelling when she is volume overloaded. She reports that she has been working on eating a lower sodium diet. On one occasion this week she did eat [employer]. She is drinking more water than previously and is limiting fluid intake to 2 L per day. She has been using a motorized cart or a walker with a seat when she goes out. Over the past few days, she has been doing more around the house including cooking and laundry. Her daughter reports that she has to stop and has very labored breathing when she walks the hallway from the bedroom to the kitchen.   Labs independently reviewed: 09/2020 - potassium 3.9, BUN 30, serum creatinine 1.47 (improved from 1.66 one week prior) 08/2020 - Hgb 13.7, PLT 277 07/2020 - TSH normal, albumin 3.5, AST/ALT normal 05/2020 -TC 134, TG 128, HDL 60, LDL 48, A1c 8.5  Past Medical History:  Diagnosis Date  . (HFpEF) heart failure with preserved ejection fraction ([place])    a. 12/2019 Echo: EF 65-70%, Gr2 DD. No  significant valvular dzs.  . Abnormality of gait 03/25/2013  . Adrenal mass, left ([place])   . Anemia    iron deficient post  2 unit txfsn 2009, normal endo/colonoscopy by [facility]  . Arthritis   . CAD (coronary artery disease) with h/o Atypical Chest Pain    a. 04/1986 Cath ([facility]): nl cors, EF 65%; b. 07/2012 Cath [facility]): Diff minor irregs; c. 07/2016 MV [facility]): "Equivocal"; d. 08/2016 Cardiac CT Ca2+ score [facility]): Ca2+ 2595; e. 05/2019 MV: No ischemia. EF 75%; f. 12/2019 PCI: LM nl, LAD 65ost (3.5x12 Resolute DES), 1m (2.75x12 Resolute DES),LCX/RCA nl; g. 08/2020 Cath: patent LAD stents, RCA 40ost, elev RH pressures, EF >65%.  . Cervical spinal stenosis 1994   due to trauma to back ([employer] accident), has intermittent paralysis and parasthesias  . Cervicogenic headache 03/23/2014  . Depression   . Dizziness    chronic dizziness  . DJD (degenerative joint disease)    a. Chronic R shoulder pain pending R shoulder replacement 07/2017.  [name]  Esophageal stenosis March 2011   with transietn outlet obstruction by food, cleared by EGD   . Family history of adverse reaction to anesthesia    daughter PONV  . Gastric bypass status for obesity   . Headache(784.0)   . Hypertension   . IBS (irritable bowel syndrome)   . Left bundle branch block (LBBB)    a. Intermittently present - likely rate related. - patient denies  . Obesity   . Obstructive sleep apnea    using CPAP  . 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 Restless leg syndrome   . Rotator cuff arthropathy, right 08/13/2017  . Syncope and collapse 03/12/2014  . Type II diabetes mellitus ([place])     Past Surgical History:  Procedure Laterality Date  . ABDOMINAL HYSTERECTOMY    . APPENDECTOMY    . CARDIAC CATHETERIZATION    . COLONOSCOPY    . CORONARY STENT INTERVENTION N/A 01/04/2020   Procedure: CORONARY STENT INTERVENTION;  Surgeon: [name], MD;  Location: [place] [facility];  Service: Cardiovascular;  Laterality: N/A;  LAD   .  DIAPHRAGMATIC HERNIA REPAIR  2015  . ESOPHAGEAL DILATION     multiple  . ESOPHAGOGASTRODUODENOSCOPY    . [place]  . GALLBLADDER SURGERY  resection  . GASTRIC BYPASS    . GASTRIC BYPASS  2000, 2005   Dr. Debroah Loop  . [facility] IMPLANT PLACEMENT  April 2013   [place]  . JOINT REPLACEMENT  2007   bilateral knee. [name],  [name]  . LEFT HEART CATH AND CORS/GRAFTS ANGIOGRAPHY N/A 12/31/2019   Procedure: LEFT HEART CATH AND CORS/GRAFTS ANGIOGRAPHY;  Surgeon: [name], MD;  Location: [place] [facility];  Service: Cardiovascular;  Laterality: N/A;  . PANNICULECTOMY  06/16/2019  . PANNICULECTOMY N/A 06/16/2019   Procedure: PANNICULECTOMY;  Surgeon: [name], MD;  Location: [place];  Service: Plastics;  Laterality: N/A;  3 hours, please  . REVERSE SHOULDER ARTHROPLASTY Right 08/13/2017   Procedure: REVERSE RIGHT SHOULDER ARTHROPLASTY;  Surgeon: [name], MD;  Location: [place];  Service: Orthopedics;  Laterality: Right;  . RIGHT/LEFT HEART CATH AND CORONARY ANGIOGRAPHY N/A 09/06/2020   Procedure: RIGHT/LEFT HEART CATH AND CORONARY ANGIOGRAPHY;  Surgeon: [name], MD;  Location: [place] [facility];  Service: Cardiovascular;  Laterality: N/A;  . ROTATOR CUFF REPAIR     right  . [place]  . TOTAL ABDOMINAL HYSTERECTOMY W/ BILATERAL SALPINGOOPHORECTOMY  1974  . UMBILICAL HERNIA REPAIR  Aug 11, 2015    Current Medications: Prior to Admission medications   Medication Sig Start Date End Date Taking? Authorizing Provider  albuterol (VENTOLIN HFA) 108 (90 Base) MCG/ACT inhaler Inhale 2 puffs into the lungs every 8 (eight) hours as needed for wheezing or shortness of breath. 08/07/19  Yes [name], MD  aspirin EC 81 MG tablet Take 1 tablet (81 mg total) by mouth daily. 01/05/20  Yes Fritzi Mandes, MD  atorvastatin (LIPITOR) 40 MG tablet Take 1 tablet (40 mg total) by mouth daily. 03/07/20  Yes Dunn, Areta Haber, PA-C  buPROPion (WELLBUTRIN) 75  MG tablet Take 75 mg by mouth 2 (two) times daily.   Yes [provider]  butalbital-acetaminophen-caffeine (FIORICET) 50-325-40 MG tablet TAKE 1 TABLET BY MOUTH EVERY 6 HOURS AS NEEDED 05/25/20  Yes [name], MD  citalopram (CELEXA) 10 MG tablet Take 1 tablet (10 mg total) by mouth daily. 09/12/20  Yes [name], MD  clopidogrel (PLAVIX) 75 MG tablet Take 1 tablet (75 mg total) by mouth daily. 03/07/20  Yes Dunn, Areta Haber, PA-C  clotrimazole-betamethasone (LOTRISONE) cream Apply 1 application topically 2 (two) times daily as needed.   Yes [provider]  Continuous Blood Gluc Receiver (FREESTYLE LIBRE 2 READER) DEVI Use to check sugar at least TID 06/23/20  Yes [name], MD  Continuous Blood Gluc Sensor (FREESTYLE LIBRE 14 DAY SENSOR) MISC Use to check sugar at least 3 times daily 06/23/20  Yes [name], MD  cyanocobalamin (,VITAMIN B-12,) 1000 MCG/ML injection Inject 1 mL (1,000 mcg total) into the muscle every 30 (thirty) days. Inject 1 ml (1000 mcg ) IM weekly x 4,  Then monthly thereafter 07/29/20  Yes [name], MD  dapagliflozin propanediol (FARXIGA) 10 MG TABS tablet Take 1 tablet (10 mg total) by mouth daily before breakfast. 09/12/20  Yes [name], MD  diazepam (VALIUM) 5 MG tablet Take 1 tablet (5 mg total) by mouth at bedtime as needed for anxiety. 05/27/20  Yes [name], MD  diphenhydrAMINE (BENADRYL) 25 MG tablet Take 50 mg by mouth daily as needed for allergies.    Yes [provider]  diphenoxylate-atropine (LOMOTIL) 2.5-0.025 MG tablet Take 1 tablet by mouth 4 (four) times daily as needed for diarrhea or loose stools. 03/24/20  Yes [name], MD  Eszopiclone 3 MG TABS Take 1 tablet (3 mg total) by mouth at bedtime. 09/12/20  Yes [name], MD  hyoscyamine (ANASPAZ) 0.125 MG TBDP disintergrating tablet DISSOLVE UNDER TONGUE 15 MINUTES BEFORE MEALS 04/21/20  Yes [name], MD  insulin aspart (NOVOLOG  FLEXPEN) 100 UNIT/ML FlexPen Inject 8 units up to 3 times daily before meals 06/23/20  Yes [name], MD  insulin glargine (LANTUS SOLOSTAR) 100 UNIT/ML Solostar Pen Inject 11 Units into the skin daily. 09/14/20  Yes [name], MD  Insulin Pen Needle (DROPLET PEN NEEDLES) 31G X 5 MM MISC USE ONE NEEDLE SUBCUTANEOUSLY AS DIRECTED (REMOVE AND DISCARD NEEDLE IN SHARPS CONTAINER IMMEDIATELY AFTER USE) 06/13/20  Yes [name], MD  Insulin Syringe-Needle U-100 (INSULIN SYRINGE 1CC/30GX5/16") 30G X 5/16" 1 ML MISC 1 Stick by Does not apply route 3 (three) times daily as needed. DX.E11.65 06/14/20  Yes [name], MD  ipratropium (ATROVENT) 0.06 % nasal spray Place 2 sprays into both nostrils 4 (four) times daily as needed for rhinitis.   Yes [provider]  loperamide (IMODIUM) 2 MG capsule Take by mouth as needed.   Yes [provider]  NONFORMULARY OR COMPOUNDED [place]  Combination Pain Cream -  Baclofen 2%, Doxepin 5%, Gabapentin 6%, Topiramate 2%, Pentoxifylline 3% Apply 1-2 grams to affected area 3-4 times daily Qty. 120 gm 3 refills 10/14/17  Yes Gardiner Barefoot, DPM  nystatin (MYCOSTATIN/NYSTOP) powder Apply 1 g topically 4 (four) times daily as needed (rash).    Yes [provider]  potassium chloride SA (KLOR-CON) 20 MEQ tablet Take 1 tablet (20 mEq total) by mouth daily. 08/19/20  Yes End, Harrell Gave, MD  pregabalin (LYRICA) 50 MG capsule Take 1 capsule (50 mg total) by mouth 2 (two) times daily. 09/13/20  Yes Kathrynn Ducking, MD  torsemide (DEMADEX) 20 MG tablet Take 1 tablet (20 mg total) by mouth as directed. Take 1 tablet daily with extra tablet if weight up by 2 pounds in 24 hours 09/23/20 12/22/20 Yes Theora Gianotti, NP  traMADol Veatrice Bourbon) 50  MG tablet Take 0.5 tablets (25 mg total) by mouth every 12 (twelve) hours as needed for severe pain. 08/21/17  Yes Regalado, Belkys A, MD  umeclidinium-vilanterol (ANORO ELLIPTA) 62.5-25  MCG/INH AEPB Inhale 1 puff into the lungs daily. 05/24/20  Yes [name], MD  nitroGLYCERIN (NITROSTAT) 0.4 MG SL tablet Place 1 tablet (0.4 mg total) under the tongue every 5 (five) minutes as needed for chest pain. 09/23/20   Theora Gianotti, NP   Allergies:   Demeclocycline, Erythromycin, Flagyl [metronidazole], Glucophage [metformin hcl], Tetracyclines & related, Diovan [valsartan], Sulfa antibiotics, and Xanax [alprazolam]   ROS:  ++chest discomfort with mild exertion, dyspnea, abdominal swelling. Denies orthopnea, dizziness, syncope, palpitations, lower extremity edema. All other systems reviewed and are otherwise negative except as noted above.    EKGs/Labs/Other Studies Reviewed:    EKG:  EKG is ordered today.  The EKG ordered today demonstrates RSR @ 77, no ST/T wave abnormalities. No acute change from previous.   Recent Labs: 07/17/2020: ALT 12; TSH 3.695 08/31/2020: Hemoglobin 13.7; Platelets 277 09/20/2020: BUN 30; Creatinine, Ser 1.47; Potassium 3.9; Sodium 138  Recent Lipid Panel    Component Value Date/Time   CHOL 134 05/24/2020 1532   CHOL 155 02/27/2014 0420   TRIG 128.0 05/24/2020 1532   TRIG 121 02/27/2014 0420   HDL 60.40 05/24/2020 1532   HDL 43 02/27/2014 0420   CHOLHDL 2 05/24/2020 1532   VLDL 25.6 05/24/2020 1532   VLDL 24 02/27/2014 0420   LDLCALC 48 05/24/2020 1532   LDLCALC 88 02/27/2014 0420   LDLDIRECT 111.0 06/16/2015 0936    PHYSICAL EXAM:    GEN: Obese, well nourished, in no acute distress. HEENT: normal. Neck: Supple, no JVD although difficult to assess due to girth,  No carotid bruits, or masses. Cardiac: RRR, no murmurs, rubs, or gallops. No clubbing, cyanosis, edema.  Radials/PT 2+ and equal bilaterally.  Respiratory:  Respirations regular and unlabored, breath sounds diminished bilaterally. No rales/crackles. GI: Obese, distended and semi-firm particularly in lower area near umbilicus. Abdomen soft in upper area underneath breasts.  Nontender, BS + x 4. MS: no deformity or atrophy. Skin: warm and dry, no rash. Neuro:  Strength and sensation are intact. Psych: Normal affect.  VS:  BP 118/72 (BP Location: Right Arm, Patient Position: Sitting, Cuff Size: Large)   Pulse 77   Ht 5' 4.5" (1.638 m)   Wt 250 lb (113.4 kg)   SpO2 95%   BMI 42.25 kg/m   Wt Readings from Last 3 Encounters:  09/23/20 250 lb (113.4 kg)  09/13/20 239 lb 4.8 oz (108.5 kg)  09/09/20 250 lb (113.4 kg)     ASSESSMENT & PLAN:   1. Acute on chronic HFpEF: Recent adjustment in outpt diuretic dosing in the setting of wt gain.  Torsemide dose down-titrated after creat rose to 1.66 (last 1.47 on 3/8 after reducing to torsemide 20 daily).  ReDS Vest score today is 39%. It is difficult to assess her volume status due to her neck and abdominal girth. She has been more active over the past several days but continues to c/o breathlessness with limited walking. She previously spent a large amount of time in her bed. She appears to be very deconditioned. Encouraged her to be more active with stationary exercise equipment or in a pool. She is interested in getting a PT referral to a center that has a pool at some point (not ready yet). Encouraged her to continue to work on increasing activity and  getting stronger. Will resume torsemide at 20 mg daily (taking 10mg  past 2 days) with instruction to take an additional 20 mg with a 2 lb weight gain in 24 hours. Advised her to call the office if weight does not go down the following day. Advised daughter to call also if weight goes well below her dry weight of 242 lb. Will have her return in 1 week to re-eval volume status/wt and bmet.  2. CAD involving the native coronary arteries with angina: LHC on 09/06/2020 showed nonobstructive CAD w/patent LAD stents, continue medical therapy advised. Continue DAPT w/Plavix and ASA through June 2022 (12 mo from original PCI 12/2019). She had an episode of chest heaviness 2 days ago on a  day that she also reports 4 lb weight gain. The pain resolved with sl ntg. She is pain free today. There is a component of tenderness with palpation and she reports the pain gets worse when she is walking. She has to stop frequently to rest when she exerts herself so it is unclear if pain is related to volume overload, MSK, or CAD. Will continue to monitor.   3. Essential HTN: Blood pressure 118/72, well controlled. Will continue current medications with focus on adequate diuretic dosage for management of HFpEF.   4. Iron deficiency anemia: Hgb 13.7 on 08/31/20. Stable. Managed by PCP. Will continue to follow.   5. Morbid obesity with OSA: Encouraged her to work on increasing activity even when she is seated. She is interested in pool therapy and would like to go to same center where her husband will get therapy for his knee - once she finds out what center that is. I agreed to write a referral to PT once she clarifies where she'd like it performed. Encouraged continued improvement in diet and weight loss.   6. HL:  LDL 48 in Nov.  Cont statin rx.  Disposition: Take Torsemide 20 mg daily. Take an additional 20 mg if weight increases by 2 lb in 24 hours. Call the office if weight does not decrease after 2nd dose. F/u with Dr. Saunders Revel or an APP in 1 week w/ bmet @ that time.   Signed, Murray Hodgkins, NP 09/23/2020 5:20 PM     [place] [address] [place], [place] [address] [phone]

## 2020-09-21 NOTE — Telephone Encounter (Signed)
-----   Message from Theora Gianotti, NP sent at 09/20/2020  6:33 PM EST ----- Creatinine a little better, but still elevated above prior baseline.  Based on our last discussion, she is currently taking torsemide 10mg  daily.  Please eval re: if wt up/symptoms worse.  Encourage PO fluids.  F/u w/ Thurmond Butts on 3/11 as planned.

## 2020-09-21 NOTE — Telephone Encounter (Signed)
Pt c/o of Chest Pain: STAT if CP now or developed within 24 hours  1. Are you having CP right now? Yes mid sternum heaviness and increasing   2. Are you experiencing any other symptoms (ex. SOB, nausea, vomiting, sweating)? Weight gain 4 lbs overnight Swelling in feet fatigue extreme weakness    3. How long have you been experiencing CP?   Since this morning   4. Is your CP continuous or coming and going? Continuous and increasing   5. Have you taken Nitroglycerin? No but wanting to do so now    ?

## 2020-09-21 NOTE — Telephone Encounter (Signed)
Incoming triage call received. Patient sts that she has had a lot going on the last couple of weeks. She is normally sedentary, but her husband who is normally her care taker recently had a TKR and she has now become his care giver. She reports a 4lb weight gain overnight. I asked her if there has been a change in her diet. She did have bean and bacon soup and grill cheese for lunch yesteerday. Advised her to be mindful of her sodium in take.She was up doing a lot yesterday that she normally does not do and she was exhausted by the end of the day.  Pt sts that she got up early this morning to let in her contractor. Initially she felt well. She weighed her self and noted she was up 4lbs. She was 243lbs yesterday and she is 247lbs this morning. She sat down on the couch and sts that she began to have chest pain 4 out of 10. She took [facility] and called the office.  During the conversation pt stated that the chest pain was improving after her Nitro use. She denies increased sob, and reports improvement in her DOE. She denies n/v, diaphoresis,  orthopnea or PND.  Patient made aware of her 09/20/20 bmet results. Carmela Rima, NP noted that the patient is taking Torsemide 10 mg qd, but the patient reports she is in fact taking Torsemide 20 mg daily since she had 40 mg tabs on hand.  She is scheduled to see Christell Faith, PA on 09/23/20. Advised the patient that it is ok to take another Nitro if needed as instructed.. She is to seek emergent care for CP not relieved with Nitro x2. She is reluctant to go to the ED stating that she does not have any one to care for her husband, but is agreeable with that recommendation.  Advise the patient that I will fwd the update to [facility] and call back with his recommendation.

## 2020-09-21 NOTE — Telephone Encounter (Signed)
Notified pt of lab results and provider's recc.  Pt reported that she is actually going to start new dose of Torsemide 10mg  tomorrow. She has continued 20mg  up through today. Also has had 4lb wt gain from yesterday.  See other telephone note from today, pt called in w/ chest pain.  Pt will follow up with Ryan on 3/11 as scheduled.

## 2020-09-21 NOTE — Telephone Encounter (Addendum)
Spoke with the patient and made her aware of Ryan's response and recommendation. Rx for Torsemide 10 mg daily sent to the patients pharmacy.  Reiterated again to the patient the importance of the sodium restriction.  She verbalized understanding.  Pt sts that the CP has resolved. She is just going to take it easy today. Adv her tio keep her scheduled appt with Thurmond Butts on 09/23/20 She can contact the office in the interim if needed.  Patient verbalized understanding and voiced appreciation for the call back.

## 2020-09-21 NOTE — Telephone Encounter (Signed)
Recent R/LHC showed nonobstructive CAD with severely elevated right heart pressures consistent with HFpEF. Suspect symptoms are related to dietary indiscretion and over doing it yesterday.  With AKI, I would not escalate her torsemide at this point as this is likely to just worsen her renal function. She needs to eliminate salt from her diet. This was discussed in detail with her at her recent visit. Recommend she reduce torsemide to 10 mg daily as previously directed by Gerald Stabs based on AKI. It was also discussed there is frequently some degree of renal injury while undergoing diuresis in this setting with elevated right heart pressures.

## 2020-09-23 ENCOUNTER — Other Ambulatory Visit: Payer: Self-pay

## 2020-09-23 ENCOUNTER — Ambulatory Visit (INDEPENDENT_AMBULATORY_CARE_PROVIDER_SITE_OTHER): Payer: Medicare Other | Admitting: Nurse Practitioner

## 2020-09-23 ENCOUNTER — Ambulatory Visit
Admission: RE | Admit: 2020-09-23 | Discharge: 2020-09-23 | Disposition: A | Discharge status: Home / Self Care | Payer: Medicare Other | Source: Ambulatory Visit | Attending: Neurology | Admitting: Neurology

## 2020-09-23 ENCOUNTER — Encounter: Payer: Self-pay | Admitting: Nurse Practitioner

## 2020-09-23 VITALS — BP 118/72 | HR 77 | Ht 64.5 in | Wt 250.0 lb

## 2020-09-23 DIAGNOSIS — E785 Hyperlipidemia, unspecified: Secondary | ICD-10-CM | POA: Diagnosis not present

## 2020-09-23 DIAGNOSIS — M542 Cervicalgia: Secondary | ICD-10-CM | POA: Diagnosis not present

## 2020-09-23 DIAGNOSIS — I10 Essential (primary) hypertension: Secondary | ICD-10-CM

## 2020-09-23 DIAGNOSIS — I25119 Atherosclerotic heart disease of native coronary artery with unspecified angina pectoris: Secondary | ICD-10-CM | POA: Diagnosis not present

## 2020-09-23 DIAGNOSIS — I5033 Acute on chronic diastolic (congestive) heart failure: Secondary | ICD-10-CM

## 2020-09-23 MED ORDER — TORSEMIDE 20 MG PO TABS: 20.0000 mg | ORAL_TABLET | ORAL | 3 refills | Status: DC

## 2020-09-23 MED ORDER — NITROGLYCERIN 0.4 MG SL SUBL: 0.4000 mg | SUBLINGUAL_TABLET | SUBLINGUAL | 1 refills | Status: DC | PRN

## 2020-09-23 NOTE — Patient Instructions (Signed)
Medication Instructions:  Your physician has recommended you make the following change in your medication:   1. INCREASE Torsemide 20 mg once daily with extra tablet if weight is up by 2 pounds in 24 hours.   *If you need a refill on your cardiac medications before your next appointment, please call your pharmacy*   Lab Work: None  If you have labs (blood work) drawn today and your tests are completely normal, you will receive your results only by: [name] MyChart Message (if you have MyChart) OR . A paper copy in the mail If you have any lab test that is abnormal or we need to change your treatment, we will call you to review the results.   Testing/Procedures: None   Follow-Up: At [facility], you and your health needs are our priority.  As part of our continuing mission to provide you with exceptional heart care, we have created designated Provider Care Teams.  These Care Teams include your primary Cardiologist (physician) and Advanced Practice Providers (APPs -  Physician Assistants and Nurse Practitioners) who all work together to provide you with the care you need, when you need it.   Your next appointment:   1 week(s)  The format for your next appointment:   In Person  Provider:   [name], MD or Christell Faith, PA-C

## 2020-09-25 ENCOUNTER — Telehealth: Payer: Self-pay | Admitting: Neurology

## 2020-09-25 NOTE — Telephone Encounter (Signed)
  I called the patient.  CT scan of the cervical spine shows significant C1 to arthritis, degenerative changes at multiple levels but no evidence of spinal stenosis or spinal cord impingement that could be adding to her gait problem.  Certainly could be a source of some of her neck discomfort and headache.  May consider an epidural steroid injection in the future if her blood sugars with her diabetes are under good control.  CT cervical 09/23/20:  IMPRESSION: 1. 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 This could be associated with calcific tendinitis of the longus colli. However, there is no evidence of retro pharyngeal inflammation, therefore I favor this is just part of the C1-2 arthritis. None the less, it could contribute to craniocervical pain syndromes. 2. Degenerative spondylosis at C5-6 and C6-7 with foraminal encroachment that could affect either or both C6 and left C7 nerves. Findings could also contribute to cervicalgia. 3. Mild facet osteoarthritis on the left at C7-T1. 4. Mild right foraminal narrowing at C3-4. Mild left foraminal narrowing at C4-5.

## 2020-09-26 ENCOUNTER — Ambulatory Visit (INDEPENDENT_AMBULATORY_CARE_PROVIDER_SITE_OTHER): Payer: Medicare Other | Admitting: Pharmacist

## 2020-09-26 DIAGNOSIS — D500 Iron deficiency anemia secondary to blood loss (chronic): Secondary | ICD-10-CM | POA: Diagnosis not present

## 2020-09-26 DIAGNOSIS — E1140 Type 2 diabetes mellitus with diabetic neuropathy, unspecified: Secondary | ICD-10-CM | POA: Diagnosis not present

## 2020-09-26 DIAGNOSIS — M4802 Spinal stenosis, cervical region: Secondary | ICD-10-CM

## 2020-09-26 DIAGNOSIS — E1165 Type 2 diabetes mellitus with hyperglycemia: Secondary | ICD-10-CM | POA: Diagnosis not present

## 2020-09-26 DIAGNOSIS — E1142 Type 2 diabetes mellitus with diabetic polyneuropathy: Secondary | ICD-10-CM | POA: Diagnosis not present

## 2020-09-26 DIAGNOSIS — I2510 Atherosclerotic heart disease of native coronary artery without angina pectoris: Secondary | ICD-10-CM | POA: Diagnosis not present

## 2020-09-26 DIAGNOSIS — IMO0002 Reserved for concepts with insufficient information to code with codable children: Secondary | ICD-10-CM

## 2020-09-26 DIAGNOSIS — I5033 Acute on chronic diastolic (congestive) heart failure: Secondary | ICD-10-CM | POA: Diagnosis not present

## 2020-09-26 MED ORDER — FREESTYLE LIBRE 2 READER DEVI: 1 refills | Status: DC

## 2020-09-26 NOTE — Patient Instructions (Signed)
Visit Information  PATIENT GOALS: Goals Addressed              This Visit's Progress     Patient Stated   .  Medication Monitoring (pt-stated)        Patient Goals/Self-Care Activities . Over the next 90 days, patient will:  - take medications as prescribed check blood glucose at least three times daily using CGM, document, and provide at future appointments       Patient verbalizes understanding of instructions provided today and agrees to view in [place].   Plan: Telephone follow up appointment with care management team member scheduled for:  ~ 6 weeks  Catie Darnelle Maffucci, PharmD, [place], [place] Clinical Pharmacist [employer] [facility] [employer] [phone]

## 2020-09-26 NOTE — Progress Notes (Signed)
Chronic Care Management Pharmacy Note  09/26/2020 Name:  Denise Sharp MRN:  [id number] DOB:  [birth year]  Subjective: Denise Sharp is an 75 y.o. year old female who is a primary patient of [name], Aris Everts, MD.  The CCM team was consulted for assistance with disease management and care coordination needs.    Engaged with patient by telephone for follow up visit in response to provider referral for pharmacy case management and/or care coordination services.   Consent to Services:  The patient was given information about Chronic Care Management services, agreed to services, and gave verbal consent prior to initiation of services.  Please see initial visit note for detailed documentation.   Patient Care Team: [name], MD as PCP - General (Internal Medicine) End, Harrell Gave, MD as PCP - Cardiology (Cardiology) Chiropractic, Freddi Che as Referring Physician De Hollingshead, RPH-CPP (Pharmacist)  Recent office visits: None since our last call  Recent consult visits:  1/28 - Pulmonary , adjusted CPAP settings  2/4 - Cardiology, Dr. Saunders Revel- added furosemide 40 mg daily, K 20 mEq  2/16- Hematology Dr. Rogue Bussing for iron deficiency anemia  2/18 - Cardiology Cadence Furth -> ordered [facility]  2/25 - Cardiology Dr. Saunders Revel f/u [facility], discussed a low sodium diet; continue DAPT 12 months (till 12/2020); d/c furosemide and started torsemide 40 mg daily  3/11 - Cardiology Ignacia Bayley, adjustment of diuretic regimen and discussion of PT  Hospital visits:  2/22- R/LHC - mild nonobstructive CAD but elevated R filling pressures; medical mgmt w/ diuresis  Objective:  Lab Results  Component Value Date   CREATININE 1.47 (H) 09/20/2020   BUN 30 (H) 09/20/2020   GFR 44.54 (L) 05/24/2020   GFRNONAA 37 (L) 09/20/2020   GFRAA >60 03/23/2020   NA 138 09/20/2020   K 3.9 09/20/2020   CALCIUM 9.0 09/20/2020   CO2 23 09/20/2020    Lab Results  Component Value Date/Time   HGBA1C 8.5  (H) 05/24/2020 03:32 PM   HGBA1C 7.6 (H) 01/25/2020 12:03 PM   HGBA1C 6.5 08/14/2018 12:00 AM   HGBA1C 6.3 10/30/2012 02:37 PM   GFR 44.54 (L) 05/24/2020 03:32 PM   GFR 53.53 (L) 01/22/2020 01:29 PM   MICROALBUR 2.8 (H) 06/04/2017 02:12 PM   MICROALBUR 9.5 (H) 06/29/2016 11:44 AM    Last diabetic Eye exam:  Lab Results  Component Value Date/Time   HMDIABEYEEXA No Retinopathy 10/15/2016 12:00 AM    Last diabetic Foot exam:  Lab Results  Component Value Date/Time   HMDIABFOOTEX abnormal 06/23/2014 12:00 AM     Lab Results  Component Value Date   CHOL 134 05/24/2020   HDL 60.40 05/24/2020   LDLCALC 48 05/24/2020   LDLDIRECT 111.0 06/16/2015   TRIG 128.0 05/24/2020   CHOLHDL 2 05/24/2020    Hepatic Function Latest Ref Rng & Units 07/17/2020 06/07/2020 06/07/2020  Total Protein 6.5 - 8.1 g/dL 7.6 - 6.9  Albumin 3.5 - 5.0 g/dL 3.5 - 3.6  AST 15 - 41 U/L 18 - 12  ALT 0 - 44 U/L 12 - 9  Alk Phosphatase 38 - 126 U/L 129(H) 153(H) 134(H)  Total Bilirubin 0.3 - 1.2 mg/dL 0.5 - 0.3  Bilirubin, Direct 0.0 - 0.3 mg/dL - - 0.1    Lab Results  Component Value Date/Time   TSH 3.695 07/17/2020 11:50 AM   TSH 3.50 01/01/2019 01:45 PM   TSH 2.279 08/20/2017 05:01 AM   TSH 4.400 06/16/2015 09:36 AM   FREET4 1.10 05/03/2015 04:20  PM    CBC Latest Ref Rng & Units 08/31/2020 07/17/2020 06/07/2020  WBC 4.0 - 10.5 K/uL 9.7 10.5 11.1(H)  Hemoglobin 12.0 - 15.0 g/dL 13.7 11.4(L) 10.1(L)  Hematocrit 36.0 - 46.0 % 41.2 38.6 32.8(L)  Platelets 150 - 400 K/uL 277 354 420.0(H)    Lab Results  Component Value Date/Time   VD25OH 21.65 (L) 06/04/2017 02:12 PM   VD25OH 23.44 (L) 11/19/2016 02:31 PM    Clinical ASCVD: Yes  The 10-year ASCVD risk score Mikey Bussing DC Jr., et al., 2013) is: 42.3%   Values used to calculate the score:     Age: 61 years     Sex: Female     Is Non-Hispanic African American: No     Diabetic: Yes     Tobacco smoker: Yes     Systolic Blood Pressure: 110 mmHg     Is BP  treated: Yes     HDL Cholesterol: 60.4 mg/dL     Total Cholesterol: 134 mg/dL    Depression screen [facility] 2/9 02/24/2020 02/17/2020 01/25/2020  Decreased Interest 1 1 0  Down, Depressed, Hopeless 0 0 0  PHQ - 2 Score 1 1 0  Altered sleeping '1 2 2  ' Tired, decreased energy '2 3 3  ' Change in appetite 2 2 0  Feeling bad or failure about yourself  1 0 0  Trouble concentrating 0 2 0  Moving slowly or fidgety/restless 0 0 3  Suicidal thoughts 0 0 0  PHQ-9 Score '7 10 8  ' Difficult doing work/chores Somewhat difficult Somewhat difficult Extremely dIfficult  Some recent data might be hidden      Social History   Tobacco Use  Smoking Status Never Smoker  Smokeless Tobacco Never Used   BP Readings from Last 3 Encounters:  09/23/20 118/72  09/13/20 (!) 143/78  09/09/20 120/78   Pulse Readings from Last 3 Encounters:  09/23/20 77  09/13/20 91  09/09/20 93   Wt Readings from Last 3 Encounters:  09/23/20 250 lb (113.4 kg)  09/13/20 239 lb 4.8 oz (108.5 kg)  09/09/20 250 lb (113.4 kg)    Assessment/Interventions: Review of patient past medical history, allergies, medications, health status, including review of consultants reports, laboratory and other test data, was performed as part of comprehensive evaluation and provision of chronic care management services.   SDOH:  (Social Determinants of Health) assessments and interventions performed: Yes SDOH Interventions   Flowsheet Row Most Recent Value  SDOH Interventions   Financial Strain Interventions Intervention Not Indicated  Physical Activity Interventions Other (Comments)  [requests referral for water PT]      CCM Care Plan  Allergies  Allergen Reactions  . Demeclocycline Hives  . Erythromycin Nausea And Vomiting and Other (See Comments)    Severe irritable bowel  . Flagyl [Metronidazole] Nausea And Vomiting and Other (See Comments)    Severe irritable bowel  . Glucophage [Metformin Hcl] Nausea And Vomiting and Other (See  Comments)    "Sick" "I won't take anything that has metformin in it"  . Tetracyclines & Related Hives and Rash  . Diovan [Valsartan] Nausea Only       . Sulfa Antibiotics Rash and Other (See Comments)    As child  . Xanax [Alprazolam] Other (See Comments)    Unknown reaction    Medications Reviewed Today    Reviewed by De Hollingshead, RPH-CPP (Pharmacist) on 09/26/20 at [place] List Status: <None>  Medication Order Taking? Sig Documenting Provider Last Dose Status Informant  albuterol (VENTOLIN HFA) 108 (90 Base) MCG/ACT inhaler [id number]  Inhale 2 puffs into the lungs every 8 (eight) hours as needed for wheezing or shortness of breath. [name], MD  Active Self  aspirin EC 81 MG tablet [id number] Yes Take 1 tablet (81 mg total) by mouth daily. Fritzi Mandes, MD Taking Active Self  atorvastatin (LIPITOR) 40 MG tablet [id number] Yes Take 1 tablet (40 mg total) by mouth daily. Rise Mu, PA-C Taking Active Self  buPROPion [facility]) 75 MG tablet [id number]  Take 75 mg by mouth 2 (two) times daily. [provider]  Active   butalbital-acetaminophen-caffeine (FIORICET) 50-325-40 MG tablet [id number] Yes TAKE 1 TABLET BY MOUTH EVERY 6 HOURS AS NEEDED [name], MD Taking Active Self  citalopram (CELEXA) 10 MG tablet [id number] Yes Take 1 tablet (10 mg total) by mouth daily. [name], MD Taking Active   clopidogrel (PLAVIX) 75 MG tablet [id number] Yes Take 1 tablet (75 mg total) by mouth daily. Rise Mu, PA-C Taking Active Self  clotrimazole-betamethasone (LOTRISONE) cream [id number]  Apply 1 application topically 2 (two) times daily as needed. [provider]  Active Self  Continuous Blood Gluc Receiver (FREESTYLE LIBRE 2 READER) [place] [id number] Yes Use to check sugar at least TID [name], MD Taking Active Self  Continuous Blood Gluc Sensor (FREESTYLE LIBRE 14 DAY SENSOR) [place] [id number] Yes Use to check sugar at least 3 times daily [name], MD Taking Active Self  cyanocobalamin (,VITAMIN B-12,) 1000 MCG/ML injection [id number] Yes Inject 1 mL (1,000 mcg total) into the muscle every 30 (thirty) days. Inject 1 ml (1000 mcg ) IM weekly x 4,  Then monthly thereafter [name], MD Taking Active Self  dapagliflozin propanediol (FARXIGA) 10 MG TABS tablet [id number] Yes Take 1 tablet (10 mg total) by mouth daily before breakfast. [name], MD Taking Active   diazepam (VALIUM) 5 MG tablet [id number] Yes Take 1 tablet (5 mg total) by mouth at bedtime as needed for anxiety. [name], MD Taking Active Self  diphenhydrAMINE (BENADRYL) 25 MG tablet [id number] No Take 50 mg by mouth daily as needed for allergies.   Patient not taking: Reported on 09/26/2020   [provider] Not Taking Active Self  diphenoxylate-atropine (LOMOTIL) 2.5-0.025 MG tablet [id number] Yes Take 1 tablet by mouth 4 (four) times daily as needed for diarrhea or loose stools. [name], MD Taking Active Self  Eszopiclone 3 MG TABS [id number] Yes Take 1 tablet (3 mg total) by mouth at bedtime. [name], MD Taking Active   hyoscyamine (ANASPAZ) 0.125 MG TBDP disintergrating tablet [id number] Yes DISSOLVE UNDER TONGUE 15 MINUTES BEFORE MEALS [name], MD Taking Active Self  insulin aspart (NOVOLOG FLEXPEN) 100 UNIT/ML FlexPen [id number] Yes Inject 8 units up to 3 times daily before meals [name], MD Taking Active Self           Med Note [facility], BRANDY L   Fri Sep 09, 2020  2:00 PM)    insulin glargine (LANTUS SOLOSTAR) 100 UNIT/ML Solostar Pen [id number] Yes Inject 11 Units into the skin daily. [name], MD Taking Active   Insulin Pen Needle (DROPLET PEN NEEDLES) 31G X 5 MM MISC [id number] Yes USE ONE NEEDLE SUBCUTANEOUSLY AS DIRECTED (REMOVE AND DISCARD NEEDLE IN SHARPS CONTAINER IMMEDIATELY AFTER USE) [name], MD Taking Active Self  ipratropium (ATROVENT) 0.06 % nasal spray [id number]  Place 2 sprays into  both nostrils 4 (four) times daily as needed for rhinitis. [provider]  Active Self  loperamide (IMODIUM) 2 MG capsule [id number]  Take by mouth as needed. [provider]  Active Self  nitroGLYCERIN (NITROSTAT) 0.4 MG SL tablet [id number] Yes Place 1 tablet (0.4 mg total) under the tongue every 5 (five) minutes as needed for chest pain. Theora Gianotti, NP Taking Active   NONFORMULARY OR COMPOUNDED [place] [id number] Yes [facility]  Combination Pain Cream -  Baclofen 2%, Doxepin 5%, Gabapentin 6%, Topiramate 2%, Pentoxifylline 3% Apply 1-2 grams to affected area 3-4 times daily Qty. 120 gm 3 refills Gardiner Barefoot, DPM Taking Active Self           Med Note [facility], BRANDY L   Fri Sep 02, 2020  2:40 PM)    nystatin (MYCOSTATIN/NYSTOP) powder [id number] No Apply 1 g topically 4 (four) times daily as needed (rash).   Patient not taking: Reported on 09/26/2020   [provider] Not Taking Active Self  potassium chloride SA (KLOR-CON) 20 MEQ tablet [id number] Yes Take 1 tablet (20 mEq total) by mouth daily. End, Harrell Gave, MD Taking Active Self  pregabalin (LYRICA) 50 MG capsule [id number] Yes Take 1 capsule (50 mg total) by mouth 2 (two) times daily. Kathrynn Ducking, MD Taking Active   torsemide (DEMADEX) 20 MG tablet [id number] Yes Take 1 tablet (20 mg total) by mouth as directed. Take 1 tablet daily with extra tablet if weight up by 2 pounds in 24 hours Theora Gianotti, NP Taking Active   traMADol (ULTRAM) 50 MG tablet [id number] No Take 0.5 tablets (25 mg total) by mouth every 12 (twelve) hours as needed for severe pain.  Patient not taking: Reported on 09/26/2020   Elmarie Shiley, MD Not Taking Active Self           Med Note [facility], BRANDY L   Fri Sep 02, 2020  2:40 PM)    umeclidinium-vilanterol [facility] ELLIPTA) 62.5-25 MCG/INH AEPB [id number]  Inhale 1 puff into the lungs daily. [name], MD  Active Self  Med  List Note Jaymes Graff 07/22/17 1307): CPAP          Patient Active Problem List   Diagnosis Date Noted  . Hyperlipidemia associated with type 2 diabetes mellitus ([place]) 08/21/2020  . Iron deficiency 06/28/2020  . Skin lesion of chest wall 06/10/2020  . Elevated alkaline phosphatase level 06/10/2020  . Coagulation defect ([place]) 06/06/2020  . Acute on chronic heart failure with preserved ejection fraction (HFpEF) ([place]) 01/14/2020  . Hyperlipidemia LDL goal <70 01/14/2020  . Coronary artery disease involving native coronary artery of native heart without angina pectoris 01/13/2020  . S/P cardiac catheterization 01/13/2020  . Accelerating angina ([place]) 12/31/2019  . Dysphagia 11/30/2019  . Gastritis 08/07/2019  . Lab test negative for COVID-19 virus 08/07/2019  . S/P panniculectomy 06/16/2019  . Major depressive disorder with current active episode 05/30/2019  . Exposure to COVID-19 virus 12/25/2018  . Functional diarrhea 10/22/2018  . Bilateral cataracts 06/26/2018  . Status post bariatric surgery 02/05/2018  . GAD (generalized anxiety disorder) 02/01/2018  . Rectocele 01/24/2018  . Vaginal prolapse 01/04/2018  . Hepatic steatosis 01/04/2018  . Microalbuminuria due to type 2 diabetes mellitus ([place]) 11/21/2017  . Hospital discharge follow-up 09/22/2017  . Hypocalcemia 09/22/2017  . Hypotension 09/22/2017  . Fecal incontinence 09/22/2017  . B12 deficiency 09/22/2017  . Transient disorientation 08/20/2017  . Rotator cuff arthropathy, right 08/13/2017  .  Primary localized osteoarthrosis of shoulder 08/13/2017  . Encounter for preoperative examination for general surgical procedure 08/03/2017  . 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 Candidiasis, intertrigo 08/03/2017  . 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 Edema 12/13/2015  . Hypersomnia, persistent 06/18/2015  . Mechanical breakdown of implanted  electronic neurostimulator of peripheral nerve ([place]) 10/16/2014  . Obesity hypoventilation syndrome ([place]) 08/13/2014  . Frequent falls 06/23/2014  . Chronic cough 05/05/2014  . Adenoma of left adrenal gland 03/24/2014  . New onset of headaches after age 3 03/23/2014  . Vitamin D deficiency 01/06/2014  . Weight gain following gastric bypass surgery 12/03/2013  . Morbid obesity ([place]) 08/25/2013  . Diaphragmatic hernia 04/06/2013  . Diabetic polyneuropathy ([place]) 03/25/2013  . Abnormality of gait 03/25/2013  . Multiple pulmonary nodules 03/20/2013  . Shortness of breath 03/20/2013  . Anemia 08/06/2012  . Iron deficiency anemia 08/06/2012  . Functional disorder of bladder 07/15/2012  . Incomplete bladder emptying 07/15/2012  . 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 Urge incontinence 07/15/2012  . Increased frequency of urination 07/15/2012  . OSA on CPAP 03/02/2012  . Insomnia secondary to anxiety 02/29/2012  . Sciatica 09/05/2011  . Cervical stenosis of spinal canal 06/14/2011  . Restless legs syndrome 03/13/2011  . Degenerative disk disease 03/13/2011  . Essential hypertension 03/12/2011  . Uncontrolled type 2 diabetes with neuropathy ([place]) 03/12/2011  . Hearing loss, right 03/12/2011    Immunization History  Administered Date(s) Administered  . Fluad Quad(high Dose 65+) 03/06/2019, 06/07/2020  . Influenza Split 06/13/2011  . Influenza, High Dose Seasonal PF 08/21/2018  . Influenza,inj,Quad PF,6+ Mos 05/04/2014, 05/12/2015  . Influenza-Unspecified 04/15/2012  . Moderna Sars-Covid-2 Vaccination 02/02/2020, 03/01/2020  . Pneumococcal Conjugate-13 08/11/2013, 01/06/2014  . Pneumococcal Polysaccharide-23 01/19/2010, 09/03/2018  . Tdap 04/18/2016    Conditions to be addressed/monitored:  Hypertension, Hyperlipidemia, Diabetes, Heart Failure and Chronic Kidney Disease  Care Plan : Medication Management  Updates made by De Hollingshead, RPH-CPP since 09/26/2020  12:00 AM    Problem: Diabetes, CHF     Long-Range Goal: Disease Progression Prevention   This Visit's Progress: On track  Recent Progress: On track  Priority: High  Note:   Current Barriers:  . Unable to achieve control of diabetes   Pharmacist Clinical Goal(s):  [name] Over the next 90 days, patient will achieve control of diabetes as evidenced by improvement in A1c through collaboration with PharmD and provider  Interventions: . 1:1 collaboration with [name], MD regarding development and update of comprehensive plan of care as evidenced by provider attestation and co-signature . Inter-disciplinary care team collaboration (see longitudinal plan of care) . Comprehensive medication review performed; medication list updated in electronic medical record  SDOH: . Reports stress recently related to her husband's recent knee replacement. She has been having to do more cooking, cleaning. Finds this difficult due to back pain, SOB. Discussed Care Guide referral for meal delivery and RN CM referral for disease management education and support. Patient amenable to both . Interested in PT referral to [facility] as her husband is planning on going there and she is interested in water therapy there. Will collaborate w/ PCP for this referral  Diabetes: . Uncontrolled; current treatment: Farxiga 10 mg daily, Lantus 10 units daily, Novolog up to 8 units up to TID with meals  o Hx intolerance to metformin d/t diarrhea o Hx Trulicity - exacerbated diarrhea  o Hx glipizide - stopped  previously when A1c was well controlled  o Hx Januvia - stopped previously when Trulicity was started  o Avoiding TZD due to HF . Current glucose readings: reports readings were in the 200s up until the last week when she and her daughter have been working on drastic diet changes. Reports readings now in 130-150s.  . Denies hypoglycemia . Reports she received the [place] 2 sensors from [employer], but they  did not send a reader because it had been too soon since her last reader was run through her insurance. She is interested in paying out of pocket. Script sent to local pharmacy for [place] 2 reader.  . Given better BG control with dietary changes and upcoming increase in physical activity with physical therapy, will defer medication changes at this time. Continue current regimen of Lantus 10 units daily and Novolog 8 units with meals.   HFpEF: . Moderately well controlled; current regimen: torsemide 20 mg daily; extensive recent visits with heart care in attempt to manage fluid burden without significant negative impact on renal function . Most recent LVEF normal 65-70% . Encouraged to continue to weigh daily, document, and report to cardiology.  . Discussed importance of low sodium diet. Patient's daughter notes that they are working on ordering more fresh fruits and vegetables. RN CM referral to disease management eduction.   Hyperlipidemia, secondary ASCVD prevention: . Controlled per last lab; current treatment: atorvastatin 40 mg daily  . Most recent LHC showed nonobstructive CAD w/ patent stenting.  . Current antiplatelet regimen: aspirin 81 mg daily, clopidogrel 75 mg daily - plan for DAPT for full 12 months s/p stenting (12/2020) . Recommended to continue current regimen at this time along with cardiology collaboration  Depression/Anxiety with Insomnia: . Moderately well managed, given current stressors with her husband's health. Current treatment: citalopram 10 mg daily, bupropion 150 mg QAM, 75 mg QPM, diazepam 5 mg QPM, Lunesta 3 mg QPM . Continue to monitor for increased sedation w/ multiple CNS sedating medications.   Chronic Diarrhea, S/p Gastric Bypass, Esophageal "growth" . Moderately well managed, given patient history. Current regimen: dicyclomine 10 mg QID PRN, hyoscyamine 10 mg QID PRN . Notes that she needs her esophagus stretched, but is awaiting cardiology clearance to be  able to hold clopidogrel. Full 12 months of DAPT recommended.   Nocturnal "leg cramps", as well as chronic pain . More exacerbated recently per patient report. Current regimen: pregabalin 100 mg QPM PRN;  . Recommended to continue current regimen at this time. Monitor for increased sedation with fluctuations in renal function  COPD:  . Well controlled; Current regimen: Anoro 62.5/25 mcg daily, albuterol HFA PRN, notes she has not required use recently . Continue current regimen at this time.   Patient Goals/Self-Care Activities . Over the next 90 days, patient will:  - take medications as prescribed check blood glucose at least three times daily using CGM, document, and provide at future appointments  Follow Up Plan: Telephone follow up appointment with care management team member scheduled for: ~ 6 weeks        Medication Assistance: None required.  Patient affirms current coverage meets needs.  Patient's preferred pharmacy is:  [name] [id number]: [phone]: [phone] [facility] [id number] [place] [address]Phone: [phone]: [phone] [facility] [place] [address]  [place] [address] Phone: [phone] Fax: [phone]   Care Plan and Follow Up Patient Decision:  Patient agrees to Care Plan and Follow-up.  Plan: Telephone follow up appointment with care management team member scheduled for:  ~ 6 weeks  Catie Darnelle Maffucci, PharmD, [place], [place] Clinical Pharmacist [employer] [facility] [employer] [phone]

## 2020-09-27 ENCOUNTER — Telehealth: Payer: Self-pay | Admitting: Internal Medicine

## 2020-09-27 NOTE — Telephone Encounter (Signed)
   Telephone encounter was:  Unsuccessful.  09/27/2020 Name: Denise Sharp MRN: [id number] DOB: 08-17-1945  Unsuccessful outbound call made today to assist with:  Food Insecurity  Outreach Attempt:  1st Attempt  A HIPAA compliant voice message was left requesting a return call.  Instructed patient to call back at [phone].  [place], Care Management Phone: [phone] Email: [email or web address]

## 2020-09-28 ENCOUNTER — Other Ambulatory Visit
Admission: RE | Admit: 2020-09-28 | Discharge: 2020-09-28 | Disposition: A | Discharge status: Home / Self Care | Payer: Medicare Other | Source: Ambulatory Visit | Attending: Internal Medicine | Admitting: Internal Medicine

## 2020-09-28 ENCOUNTER — Ambulatory Visit: Payer: Medicare Other | Admitting: Internal Medicine

## 2020-09-28 ENCOUNTER — Ambulatory Visit (INDEPENDENT_AMBULATORY_CARE_PROVIDER_SITE_OTHER): Payer: Medicare Other | Admitting: Internal Medicine

## 2020-09-28 ENCOUNTER — Other Ambulatory Visit: Payer: Self-pay

## 2020-09-28 ENCOUNTER — Encounter: Payer: Self-pay | Admitting: Internal Medicine

## 2020-09-28 VITALS — BP 100/60 | HR 73 | Ht 64.5 in | Wt 251.2 lb

## 2020-09-28 DIAGNOSIS — E1169 Type 2 diabetes mellitus with other specified complication: Secondary | ICD-10-CM | POA: Diagnosis not present

## 2020-09-28 DIAGNOSIS — E785 Hyperlipidemia, unspecified: Secondary | ICD-10-CM

## 2020-09-28 DIAGNOSIS — I2510 Atherosclerotic heart disease of native coronary artery without angina pectoris: Secondary | ICD-10-CM

## 2020-09-28 DIAGNOSIS — I10 Essential (primary) hypertension: Secondary | ICD-10-CM

## 2020-09-28 DIAGNOSIS — I5032 Chronic diastolic (congestive) heart failure: Secondary | ICD-10-CM

## 2020-09-28 LAB — BASIC METABOLIC PANEL
Anion gap: 11 (ref 5–15)
BUN: 28 mg/dL — ABNORMAL HIGH (ref 8–23)
CO2: 24 mmol/L (ref 22–32)
Calcium: 9.5 mg/dL (ref 8.9–10.3)
Chloride: 104 mmol/L (ref 98–111)
Creatinine, Ser: 1.5 mg/dL — ABNORMAL HIGH (ref 0.44–1.00)
GFR, Estimated: 36 mL/min — ABNORMAL LOW (ref >60)
Glucose, Bld: 174 mg/dL — ABNORMAL HIGH (ref 70–99)
Potassium: 5 mmol/L (ref 3.5–5.1)
Sodium: 139 mmol/L (ref 135–145)

## 2020-09-28 LAB — MAGNESIUM: Magnesium: 2.3 mg/dL (ref 1.7–2.4)

## 2020-09-28 NOTE — Progress Notes (Signed)
Follow-up Outpatient Visit Date: 09/28/2020  Primary Care Provider: [name], MD [address] [place] [address]  Chief Complaint: Leg cramps and shortness of breath  HPI:  [name] is a 75 y.o. female with history of CAD status post PCI to the ostial and mid LAD in the setting of unstable angina in 12/2019, hypertension, type 2 diabetes mellitus, iron deficiency anemia following gastric bypass x2, irritable bowel syndrome, esophageal stenosis, chronic dizziness with gait instability, cervicogenic headache with cervical spine stenosis, obstructive sleep apnea on CPAP, restless leg syndrome, and adrenal mass, who presents for follow-up of coronary artery disease and chronic HFpEF.  She was last seen in our office a week ago by Ignacia Bayley, NP, after right and left heart catheterization in late February demonstrated patent LAD stents and no other significant CAD.  She was found to have mildly elevated left heart and pulmonary artery pressures and severely elevated right heart filling pressures with reduced Fick cardiac output/index.  At her last visit, Denise Sharp was advised to increase torsemide to 20 mg daily with additional doses for weight gain/swelling.  She does not feel like her breathing or edema are much better.  She has complained of increased leg cramps that she attributes to aggressive diuresis.  She also complains of significant back pain and feels like this is contributing to her shortness of breath.  She has not had any chest pain, palpitations, or lightheadedness.  She is planning to begin physical therapy and water aerobics in the next few weeks in hopes this will help her back pain and deconditioning.  She remains compliant with DAPT without significant bleeding.  --------------------------------------------------------------------------------------------------  Cardiovascular Problems:  Intermittent left bundle branch block  Coronary artery  disease status post PCI to ostial and mid LAD (12/2018)  Risk Factors:  Hypertension, hyperlipidemia, diabetes mellitus, obesity, sedentary lifestyle, and age greater than 34  Cath/PCI:  R/LHC (09/06/2020): Nonobstructive CAD with 40% ostial RCA stents.  Widely patent ostial/proximal and mid LAD stents with distal LAD tapering to a small vessel.  No significant disease noted in the LCx.  Mildly elevated left heart and pulmonary artery pressures (PCWP 22, mean PA 27).  Severely elevated right heart filling pressures (mean RA 17).  Moderately reduced Fick cardiac output/index (CO 3.9, CI 1.8).  PCI (01/04/20): Successful PCI to the ostial and mid LAD using Resolute Onyx 3.5 x 12 mm and 2.75 x 12 mm drug-eluting stents, respectively.  LHC (12/31/19): LMCA normal. Sequential 80% ostial and mid LAD stenoses. No significant disease involving LCx and RCA  LHC (07/24/12, [facility]): LMCA normal. LAD, LCx, and RCA with minor luminal irregularities.  LHC (04/15/86, [facility]): Normal LMCA, LAD, LCx, and RCA. LVEF 65%.  CV Surgery:  None  EP Procedures and Devices:  None  Non-Invasive Evaluation(s):  TTE (12/30/2019): Normal LV size with LVEF 65-70% and grade 2 diastolic dysfunction. Normal RV size and function. No significant valvular abnormality.  Pharmacologic MPI (06/03/19): Low risk study without evidence of ischemia. Small fixed defect at the apex was felt to be most consistent with artifact and less likely scar.  Cardiac CT calcium score (09/03/16, Dr. Humphrey Rolls): Coronary calcium score 1548.  Pharmacologic myocardial perfusion stress test (08/08/16, Dr. Humphrey Rolls): "Equivocal stress test" with small inferior wall reversible defect and hyperdynamic LV contraction (EF 87%).  Transthoracic echocardiogram (08/06/16, Dr.Khan): Technically difficult study with mildly dilated left ventricle. LVEF was normal with normal wall motion. Grade 1 diastolic dysfunction. Mildly dilated RV with normal contraction.  Mitral annular calcification present with mild MR. Mild PR and TR also present. Normal pulmonary artery pressure. Severe left and mild right atrial enlargement.  Pharmacologic myocardial perfusion stress test (08/11/15, [facility]): Normal perfusion without ischemia or scar. LVEF 81%.   Recent CV Pertinent Labs: Lab Results  Component Value Date   CHOL 134 05/24/2020   CHOL 155 02/27/2014   HDL 60.40 05/24/2020   HDL 43 02/27/2014   LDLCALC 48 05/24/2020   LDLCALC 88 02/27/2014   LDLDIRECT 111.0 06/16/2015   TRIG 128.0 05/24/2020   TRIG 121 02/27/2014   CHOLHDL 2 05/24/2020   INR 1.1 12/31/2019   K 5.0 09/28/2020   K 4.0 06/21/2014   MG 2.3 09/28/2020   MG 1.5 (L) 08/05/2012   BUN 28 (H) 09/28/2020   BUN 11 10/22/2018   BUN 12 06/21/2014   CREATININE 1.50 (H) 09/28/2020   CREATININE 0.88 03/06/2019    Past medical and surgical history were reviewed and updated in EPIC.  Current Meds  Medication Sig  . albuterol (VENTOLIN HFA) 108 (90 Base) MCG/ACT inhaler Inhale 2 puffs into the lungs every 8 (eight) hours as needed for wheezing or shortness of breath.  [name] aspirin EC 81 MG tablet Take 1 tablet (81 mg total) by mouth daily.  [name] atorvastatin (LIPITOR) 40 MG tablet Take 1 tablet (40 mg total) by mouth daily.  [name] buPROPion (WELLBUTRIN) 75 MG tablet Take 75 mg by mouth 2 (two) times daily.  . butalbital-acetaminophen-caffeine (FIORICET) 50-325-40 MG tablet TAKE 1 TABLET BY MOUTH EVERY 6 HOURS AS NEEDED  . citalopram (CELEXA) 10 MG tablet Take 1 tablet (10 mg total) by mouth daily.  . clopidogrel (PLAVIX) 75 MG tablet Take 1 tablet (75 mg total) by mouth daily.  . clotrimazole-betamethasone (LOTRISONE) cream Apply 1 application topically 2 (two) times daily as needed.  . Continuous Blood Gluc Receiver (FREESTYLE LIBRE 2 READER) DEVI Use to check sugar at least TID  . cyanocobalamin (,VITAMIN B-12,) 1000 MCG/ML injection Inject 1 mL (1,000 mcg total) into the muscle every 30 (thirty)  days. Inject 1 ml (1000 mcg ) IM weekly x 4,  Then monthly thereafter  . dapagliflozin propanediol (FARXIGA) 10 MG TABS tablet Take 1 tablet (10 mg total) by mouth daily before breakfast.  . diazepam (VALIUM) 5 MG tablet Take 1 tablet (5 mg total) by mouth at bedtime as needed for anxiety.  . diphenhydrAMINE (BENADRYL) 25 MG tablet Take 50 mg by mouth daily as needed for allergies.  . diphenoxylate-atropine (LOMOTIL) 2.5-0.025 MG tablet Take 1 tablet by mouth 4 (four) times daily as needed for diarrhea or loose stools.  . Eszopiclone 3 MG TABS Take 1 tablet (3 mg total) by mouth at bedtime.  . hyoscyamine (ANASPAZ) 0.125 MG TBDP disintergrating tablet DISSOLVE UNDER TONGUE 15 MINUTES BEFORE MEALS  . insulin aspart (NOVOLOG FLEXPEN) 100 UNIT/ML FlexPen Inject 8 units up to 3 times daily before meals  . insulin glargine (LANTUS SOLOSTAR) 100 UNIT/ML Solostar Pen Inject 11 Units into the skin daily.  . Insulin Pen Needle (DROPLET PEN NEEDLES) 31G X 5 MM MISC USE ONE NEEDLE SUBCUTANEOUSLY AS DIRECTED (REMOVE AND DISCARD NEEDLE IN SHARPS CONTAINER IMMEDIATELY AFTER USE)  . ipratropium (ATROVENT) 0.06 % nasal spray Place 2 sprays into both nostrils 4 (four) times daily as needed for rhinitis.  [name] loperamide (IMODIUM) 2 MG capsule Take by mouth as needed.  . nitroGLYCERIN (NITROSTAT) 0.4 MG SL tablet Place 1 tablet (0.4 mg total) under the tongue every 5 (  five) minutes as needed for chest pain.  . NONFORMULARY OR COMPOUNDED [place]  Combination Pain Cream -  Baclofen 2%, Doxepin 5%, Gabapentin 6%, Topiramate 2%, Pentoxifylline 3% Apply 1-2 grams to affected area 3-4 times daily Qty. 120 gm 3 refills  . nystatin (MYCOSTATIN/NYSTOP) powder Apply 1 g topically 4 (four) times daily as needed (rash).  . potassium chloride SA (KLOR-CON) 20 MEQ tablet Take 1 tablet (20 mEq total) by mouth daily.  . pregabalin (LYRICA) 50 MG capsule Take 1 capsule (50 mg total) by mouth 2 (two) times daily.  [name]  torsemide (DEMADEX) 20 MG tablet Take 1 tablet (20 mg total) by mouth as directed. Take 1 tablet daily with extra tablet if weight up by 2 pounds in 24 hours  . traMADol (ULTRAM) 50 MG tablet Take 0.5 tablets (25 mg total) by mouth every 12 (twelve) hours as needed for severe pain.  [name] umeclidinium-vilanterol (ANORO ELLIPTA) 62.5-25 MCG/INH AEPB Inhale 1 puff into the lungs daily.    Allergies: Demeclocycline, Erythromycin, Flagyl [metronidazole], Glucophage [metformin hcl], Tetracyclines & related, Diovan [valsartan], Sulfa antibiotics, and Xanax [alprazolam]  Social History   Tobacco Use  . Smoking status: Never Smoker  . Smokeless tobacco: Never Used  Vaping Use  . Vaping Use: Never used  Substance Use Topics  . Alcohol use: No  . Drug use: No    Family History  Problem Relation Age of Onset  . Heart disease Father   . Hypertension Father   . Prostate cancer Father   . Stroke Father   . Osteoporosis Father   . Stroke Mother   . Depression Mother   . Headache Mother   . Heart disease Mother   . Thyroid disease Mother   . Hypertension Mother   . Diabetes Daughter   . Heart disease Daughter   . Hypertension Daughter   . Hypertension Son     Review of Systems: A 12-system review of systems was performed and was negative except as noted in the HPI.  --------------------------------------------------------------------------------------------------  Physical Exam: BP 100/60 (BP Location: Left Arm, Patient Position: Sitting, Cuff Size: Normal)   Pulse 73   Ht 5' 4.5" (1.638 m)   Wt 251 lb 4 oz (114 kg)   SpO2 98%   BMI 42.46 kg/m   ReDS Vest: 31% (39% on 09/23/20)  General: Morbidly obese woman seated in a wheelchair. Neck: Unable to assess JVP due to body habitus. Lungs: Clear to auscultation bilaterally without wheezes or crackles. Heart: Regular rate and rhythm without murmurs, rubs, or gallops. Abdomen: Soft, nontender, nondistended. Extremities: Trace pretibial  edema bilaterally.  EKG:  Normal sinus rhythm without abnormality.  Lab Results  Component Value Date   WBC 9.7 08/31/2020   HGB 13.7 08/31/2020   HCT 41.2 08/31/2020   MCV 91.8 08/31/2020   PLT 277 08/31/2020    Lab Results  Component Value Date   NA 139 09/28/2020   K 5.0 09/28/2020   CL 104 09/28/2020   CO2 24 09/28/2020   BUN 28 (H) 09/28/2020   CREATININE 1.50 (H) 09/28/2020   GLUCOSE 174 (H) 09/28/2020   ALT 12 07/17/2020    Lab Results  Component Value Date   CHOL 134 05/24/2020   HDL 60.40 05/24/2020   LDLCALC 48 05/24/2020   LDLDIRECT 111.0 06/16/2015   TRIG 128.0 05/24/2020   CHOLHDL 2 05/24/2020    --------------------------------------------------------------------------------------------------  ASSESSMENT AND PLAN: Coronary artery disease: Patient has chronic shortness of breath though I do not  believe this is primarily due to coronary insufficiency given nonobstructive disease with widely patent LAD stents on catheterization last month.  We will plan to complete 12 months of DAPT from the time of her PCI in the setting of unstable angina in 12/2019.  Chronic HFpEF: Volume status remains very difficult to assess due to morbid obesity and poor functional capacity that is also limited by back pain.  ReDS Vest reading has improved to 31% from last week after escalation of torsemide.  Given her leg cramping, I will check a BMP and magnesium level today to ensure electrolytes and renal function are stable.  I hope to continue current dose of torsemide 20 mg daily.  I encouraged Denise Sharp to begin working with physical therapy in an attempt to improve her functional status.  I will refer her to the advanced heart failure clinic in [facility] for further evaluation and management of her chronic HFpEF that seems to be driven primarily by right heart failure on her recent catheterization.  Hypertension: Blood pressure is borderline low today but without significant  symptoms of hypotension.  Continue current medications.  Hyperlipidemia associated with type 2 diabetes mellitus: LDL and triglycerides well controlled on last check in 05/2020.  Continue atorvastatin 40 mg daily.  Ongoing management of diabetes mellitus per [name].  Morbid obesity: Denise Sharp has made significant lifestyle modifications since her catheterization and is trying to limit her sodium and sugar intake.  I congratulated her on these lifestyle changes and encouraged her to continue with them.  Follow-up: Return to clinic in 1 month.  [name], MD 09/30/2020 11:28 AM

## 2020-09-28 NOTE — Patient Instructions (Signed)
Medication Instructions:  Your physician recommends that you continue on your current medications as directed. Please refer to the Current Medication list given to you today.  *If you need a refill on your cardiac medications before your next appointment, please call your pharmacy*  Lab Work: Your physician recommends that you return for lab work in: [place], [place].  If you have labs (blood work) drawn today and your tests are completely normal, you will receive your results only by: [name] MyChart Message (if you have MyChart) OR . A paper copy in the mail If you have any lab test that is abnormal or we need to change your treatment, we will call you to review the results.  Testing/Procedures: none  Follow-Up: At [facility], you and your health needs are our priority.  As part of our continuing mission to provide you with exceptional heart care, we have created designated Provider Care Teams.  These Care Teams include your primary Cardiologist (physician) and Advanced Practice Providers (APPs -  Physician Assistants and Nurse Practitioners) who all work together to provide you with the care you need, when you need it.  We recommend signing up for the patient portal called "MyChart".  Sign up information is provided on this After Visit Summary.  MyChart is used to connect with patients for Virtual Visits (Telemedicine).  Patients are able to view lab/test results, encounter notes, upcoming appointments, etc.  Non-urgent messages can be sent to your provider as well.   To learn more about what you can do with MyChart, go to [email or web address].    Your next appointment:   1 month(s) with Dr End or APP  The format for your next appointment:   In Person  Provider:   You may see [name], MD  or one of the following Advanced Practice Providers on your designated Care Team:    Murray Hodgkins, NP  Christell Faith, PA-C  Marrianne Mood, PA-C  Cadence [place], [place]  Laurann [place], NP    Other Instructions  You have been referred to The Advanced Heart Failure clinic in [place]. Please call the number on the next page to schedule an appointment.

## 2020-09-29 ENCOUNTER — Telehealth: Payer: Self-pay

## 2020-09-29 NOTE — Chronic Care Management (AMB) (Signed)
  Chronic Care Management   Note  09/29/2020 Name: Denise Sharp MRN: [id number] DOB: 1945-07-30  Denise Sharp is a 75 y.o. year old female who is a primary care patient of [name], Aris Everts, MD. Denise Sharp is currently enrolled in care management services. An additional referral for RN CM  was placed.   Follow up plan: Unsuccessful telephone outreach attempt made. A HIPAA compliant phone message was left for the patient providing contact information and requesting a return call.  The care management team will reach out to the patient again over the next 5 days.  If patient returns call to provider office, please advise to call [place]  at [place], [place], [place], [place] [address] Direct Dial: [phone] [email or web address] Website: [email or web address]

## 2020-09-30 ENCOUNTER — Encounter: Payer: Self-pay | Admitting: Internal Medicine

## 2020-10-01 ENCOUNTER — Other Ambulatory Visit: Payer: Self-pay | Admitting: Physician Assistant

## 2020-10-03 ENCOUNTER — Telehealth: Payer: Self-pay | Admitting: Internal Medicine

## 2020-10-03 NOTE — Telephone Encounter (Signed)
Patient calling back. States she's been working hard to bring her bring her weight down. Decreased salt to almost zero, drinking only 2 L of fluid per day. Daily weights have been fluctuating around 242.8 to 242 to 240. 3/20 238 lb 3/21 240.2 lb  She was thrilled her weight had finally gotten below 240 yesterday; however, today it went back up to 240.2 lb. Breathing is much better and her feet swelling is better as well. Spinal pain issues are still present.  On Torsemide 40 mg, patient reports she voided every couple hours during the day. On Torsemide 20 mg, patient reports only voiding 3-4 times during waking hours. She would like to know if there is anything else she can do at this time. She has Torsemide 10 mg, 20 mg and 40 mg at home so she can take extra if advised.   Dr End referred her to [facility].  I received staff message that Kevan Rosebush, RN will have the chart reviewed by the HF team and then contact patient for appointment.  Routing to Dr Saunders Revel as Juluis Rainier.

## 2020-10-03 NOTE — Telephone Encounter (Signed)
No answer. Left message to call back on mobile and home number.

## 2020-10-03 NOTE — Telephone Encounter (Signed)
.  Pt c/o swelling: STAT is pt has developed SOB within 24 hours  1) How much weight have you gained and in what time span? 2 POUNDS OVERNIGHT   2) If swelling, where is the swelling located? Bilateral feet  3) Are you currently taking a fluid pill? yes  4) Are you currently SOB? yes  5) Do you have a log of your daily weights (if so, list)? Yesterday 238, this morning 240.2  6) Have you gained 3 pounds in a day or 5 pounds in a week? no  7) Have you traveled recently? no

## 2020-10-04 NOTE — Telephone Encounter (Signed)
I recommend continuing torsemide 20 mg daily, though she can take a second dose if she gains 2 pounds in 1 day or 5 pounds in 1 week.  She should continue with her low sodium diet.  [name], MD [facility]

## 2020-10-04 NOTE — Telephone Encounter (Signed)
Patient notified and verbalized understanding of Dr Darnelle Bos recommendations. States her weight this morning was 233 lb which is down about 7 pounds.  She said she did not take the Torsemide until last evening around 7 pm and this morning was down 7 lbs. She was very glad to be down that much. She will continue to decrease her salt intake and monitor her weight.

## 2020-10-07 ENCOUNTER — Telehealth: Payer: Self-pay | Admitting: *Deleted

## 2020-10-07 NOTE — Chronic Care Management (AMB) (Signed)
  Chronic Care Management   Note  10/07/2020 Name: IONIA SCHEY MRN: [id number] DOB: 1946-06-28  LENIX BENOIST is a 75 y.o. year old female who is a primary care patient of [name], Aris Everts, MD. DAIZY OUTEN is currently enrolled in care management services. An additional referral for RN CM  was placed.   Follow up plan: Unsuccessful telephone outreach attempt made. A HIPAA compliant phone message was left for the patient providing contact information and requesting a return call.  The care management team will reach out to the patient again over the next 7 days.  If patient returns call to provider office, please advise to call [place]  at [place], [place], [place], [place] [address] Direct Dial: [phone] [email or web address] Website: [email or web address]

## 2020-10-07 NOTE — Telephone Encounter (Signed)
-----   Message from Armando Gang sent at 10/03/2020 10:39 AM EDT ----- Regarding: FW: Referral to Advanced heart Failure Clinic in [place] I have forward this message to [place] with CHF- once the referral has been reviewed by the Department, they will make the appointment.     ----- Message ----- From: Annia Belt, RN Sent: 09/30/2020   3:01 PM EDT To: [facility] Subject: Referral to Advanced heart Failure Clinic in#  Dr End requests Referral to Advanced heart Failure Clinic in [place].  Thanks, Meyer Cory RN

## 2020-10-10 NOTE — Chronic Care Management (AMB) (Signed)
  Chronic Care Management   Note  10/10/2020 Name: Denise Sharp MRN: [id number] DOB: 1945-09-19  Denise Sharp is a 75 y.o. year old female who is a primary care patient of [name], Aris Everts, MD. Denise Sharp is currently enrolled in care management services. An additional referral for RNCM was placed.   Follow up plan: Telephone appointment with care management team member scheduled for:04/06/20222  Denise Sharp, [place], [place], [place] [address] Direct Dial: [phone] [email or web address] Website: [email or web address]

## 2020-10-10 NOTE — Chronic Care Management (AMB) (Signed)
  Chronic Care Management   Note  10/10/2020 Name: Denise Sharp MRN: [id number] DOB: 05-29-1946  Denise Sharp is a 75 y.o. year old female who is a primary care patient of [name], Aris Everts, MD. Denise Sharp is currently enrolled in care management services. An additional referral for RN CM was placed.   Follow up plan: Unsuccessful telephone outreach attempt made. A HIPAA compliant phone message was left for the patient providing contact information and requesting a return call.  The care management team will reach out to the patient again over the next 5 days.  If patient returns call to provider office, please advise to call [facility] [place]  at [place], [place] [address], [place], [place] [address] Direct Dial: [phone] [email or web address] Website: [email or web address]

## 2020-10-11 NOTE — Progress Notes (Signed)
Patient has been scheduled for 10/19/2020

## 2020-10-13 ENCOUNTER — Other Ambulatory Visit: Payer: Self-pay | Admitting: Physician Assistant

## 2020-10-13 NOTE — Assessment & Plan Note (Addendum)
Benefits from CPAP with good compliance and control. Plan- continue CPAP auto 4-12. We are going to try changing auto range to 4-15 and [employer] is to service or replace machine.

## 2020-10-13 NOTE — Assessment & Plan Note (Signed)
Recognize potential contribution to dyspnea. Now getting iron supplements.

## 2020-10-13 NOTE — Assessment & Plan Note (Signed)
Limited mobility and limited ability to change metabolic rate. Must emphasize strict attention to diet and may not be able.

## 2020-10-15 ENCOUNTER — Encounter: Payer: Self-pay | Admitting: Internal Medicine

## 2020-10-15 NOTE — Assessment & Plan Note (Signed)
Lung function pretty good, admitting occasional mild cough or wheeze. Bigger factors are obesity, deconditioning and cardiac problemsl Plan- continue Rescue inhaler and Anoro. Walk more and eat less. 

## 2020-10-17 ENCOUNTER — Telehealth: Payer: Self-pay | Admitting: Internal Medicine

## 2020-10-17 NOTE — Telephone Encounter (Signed)
Patient is on restricted fluid intake from cardiologist. Her Elenor Legato is stating her sugars are above 300. She is feeling fatigued and nauseated no other sx. She took her blood sugar on her finger stick and it was 299 while on the phone. She wants to know can she go up on her medication or is there anything she can really do. She is not taking any medication to raise her BS.

## 2020-10-17 NOTE — Telephone Encounter (Signed)
Pt called her Blood sugar was 327 at 2:27 and now its down to 304 she took her lantus and 2 Novolog. She has only eaten one meal today  She wanted to know if she needs to take more insulin. She is nauseous and fatigued

## 2020-10-17 NOTE — Telephone Encounter (Signed)
Patient has been notified

## 2020-10-17 NOTE — Telephone Encounter (Signed)
Increase her  lantus dose to 15 units daily if she has been taking 11 units.

## 2020-10-18 ENCOUNTER — Telehealth: Payer: Self-pay | Admitting: Internal Medicine

## 2020-10-18 NOTE — Telephone Encounter (Signed)
   Telephone encounter was:  Successful.  10/18/2020 Name: [name] MRN: [id number] DOB: [birth year]  Denise Sharp is a 75 y.o. year old female who is a primary care patient of [name], Aris Everts, MD . The community resource team was consulted for assistance with [place] guide performed the following interventions: Patient provided with information about care guide support team and interviewed to confirm resource needs Discussed resources to assist with food needs. She said that she doesn't need food, she needs someone to make her meals with the food that she has in the house. I recommended that we place her on MOM's meals temporarily and she declined saying that she couldn't place these meals in her freezer with all the food they have. She declined the service and help at this moment saying that they hired a housekeeper that will help more until her husband heals. No further needs at this time. .  Follow Up Plan:  No further follow up planned at this time. The patient has been provided with needed resources.  [place], Care Management Phone: [phone] Email: [email or web address]

## 2020-10-19 ENCOUNTER — Telehealth: Payer: Self-pay | Admitting: Internal Medicine

## 2020-10-19 ENCOUNTER — Ambulatory Visit: Payer: Medicare Other | Admitting: Pharmacist

## 2020-10-19 ENCOUNTER — Ambulatory Visit (INDEPENDENT_AMBULATORY_CARE_PROVIDER_SITE_OTHER): Payer: Medicare Other | Admitting: *Deleted

## 2020-10-19 DIAGNOSIS — I5033 Acute on chronic diastolic (congestive) heart failure: Secondary | ICD-10-CM

## 2020-10-19 DIAGNOSIS — E1140 Type 2 diabetes mellitus with diabetic neuropathy, unspecified: Secondary | ICD-10-CM | POA: Diagnosis not present

## 2020-10-19 DIAGNOSIS — I2510 Atherosclerotic heart disease of native coronary artery without angina pectoris: Secondary | ICD-10-CM

## 2020-10-19 DIAGNOSIS — I10 Essential (primary) hypertension: Secondary | ICD-10-CM

## 2020-10-19 DIAGNOSIS — E1142 Type 2 diabetes mellitus with diabetic polyneuropathy: Secondary | ICD-10-CM | POA: Diagnosis not present

## 2020-10-19 DIAGNOSIS — IMO0002 Reserved for concepts with insufficient information to code with codable children: Secondary | ICD-10-CM

## 2020-10-19 DIAGNOSIS — E1165 Type 2 diabetes mellitus with hyperglycemia: Secondary | ICD-10-CM | POA: Diagnosis not present

## 2020-10-19 NOTE — Telephone Encounter (Signed)
Pt called she needs information on her Elenor Legato supplies

## 2020-10-20 ENCOUNTER — Telehealth: Payer: Self-pay

## 2020-10-20 MED ORDER — DIPHENOXYLATE-ATROPINE 2.5-0.025 MG PO TABS: 1.0000 | ORAL_TABLET | Freq: Four times a day (QID) | ORAL | 5 refills | Status: DC | PRN

## 2020-10-20 MED ORDER — BUPROPION HCL 75 MG PO TABS: 75.0000 mg | ORAL_TABLET | Freq: Two times a day (BID) | ORAL | 1 refills | Status: DC

## 2020-10-20 NOTE — Chronic Care Management (AMB) (Addendum)
Chronic Care Management Pharmacy Note  10/20/2020 Name:  Denise Sharp MRN:  [id number] DOB:  [birth year]  Subjective: Denise Sharp is an 75 y.o. year old female who is a primary patient of [name], Aris Everts, MD.  The CCM team was consulted for assistance with disease management and care coordination needs.    Engaged with patient by telephone for  device access needs  in response to provider referral for pharmacy case management and/or care coordination services.   Consent to Services:  The patient was given information about Chronic Care Management services, agreed to services, and gave verbal consent prior to initiation of services.  Please see initial visit note for detailed documentation.   Patient Care Team: [name], MD as PCP - General (Internal Medicine) End, Harrell Gave, MD as PCP - Cardiology (Cardiology) Chiropractic, Freddi Che as Referring Physician De Hollingshead, RPH-CPP (Pharmacist) Leona Singleton, RN as Case Manager  Recent office visits: None since our last visit  Recent consult visits: None since our last visit  Hospital visits: See last CCM visit encounter  Objective:  Lab Results  Component Value Date   CREATININE 1.50 (H) 09/28/2020   CREATININE 1.47 (H) 09/20/2020   CREATININE 1.66 (H) 09/15/2020    Lab Results  Component Value Date   HGBA1C 8.5 (H) 05/24/2020   Last diabetic Eye exam:  Lab Results  Component Value Date/Time   HMDIABEYEEXA No Retinopathy 10/15/2016 12:00 AM    Last diabetic Foot exam:  Lab Results  Component Value Date/Time   HMDIABFOOTEX abnormal 06/23/2014 12:00 AM        Component Value Date/Time   CHOL 134 05/24/2020 1532   CHOL 155 02/27/2014 0420   TRIG 128.0 05/24/2020 1532   TRIG 121 02/27/2014 0420   HDL 60.40 05/24/2020 1532   HDL 43 02/27/2014 0420   CHOLHDL 2 05/24/2020 1532   VLDL 25.6 05/24/2020 1532   VLDL 24 02/27/2014 0420   LDLCALC 48 05/24/2020 1532   LDLCALC 88 02/27/2014  0420   LDLDIRECT 111.0 06/16/2015 0936    Hepatic Function Latest Ref Rng & Units 07/17/2020 06/07/2020 06/07/2020  Total Protein 6.5 - 8.1 g/dL 7.6 - 6.9  Albumin 3.5 - 5.0 g/dL 3.5 - 3.6  AST 15 - 41 U/L 18 - 12  ALT 0 - 44 U/L 12 - 9  Alk Phosphatase 38 - 126 U/L 129(H) 153(H) 134(H)  Total Bilirubin 0.3 - 1.2 mg/dL 0.5 - 0.3  Bilirubin, Direct 0.0 - 0.3 mg/dL - - 0.1    Lab Results  Component Value Date/Time   TSH 3.695 07/17/2020 11:50 AM   TSH 3.50 01/01/2019 01:45 PM   TSH 2.279 08/20/2017 05:01 AM   TSH 4.400 06/16/2015 09:36 AM   FREET4 1.10 05/03/2015 04:20 PM    CBC Latest Ref Rng & Units 08/31/2020 07/17/2020 06/07/2020  WBC 4.0 - 10.5 K/uL 9.7 10.5 11.1(H)  Hemoglobin 12.0 - 15.0 g/dL 13.7 11.4(L) 10.1(L)  Hematocrit 36.0 - 46.0 % 41.2 38.6 32.8(L)  Platelets 150 - 400 K/uL 277 354 420.0(H)    Lab Results  Component Value Date/Time   VD25OH 21.65 (L) 06/04/2017 02:12 PM   VD25OH 23.44 (L) 11/19/2016 02:31 PM    Clinical ASCVD: Yes  The 10-year ASCVD risk score Mikey Bussing DC Jr., et al., 2013) is: 32.6%   Values used to calculate the score:     Age: 32 years     Sex: Female     Is Non-Hispanic African American:  No     Diabetic: Yes     Tobacco smoker: Yes     Systolic Blood Pressure: 446 mmHg     Is BP treated: Yes     HDL Cholesterol: 60.4 mg/dL     Total Cholesterol: 134 mg/dL    Social History   Tobacco Use  Smoking Status Never Smoker  Smokeless Tobacco Never Used   BP Readings from Last 3 Encounters:  09/28/20 100/60  09/23/20 118/72  09/13/20 (!) 143/78   Pulse Readings from Last 3 Encounters:  09/28/20 73  09/23/20 77  09/13/20 91   Wt Readings from Last 3 Encounters:  09/28/20 251 lb 4 oz (114 kg)  09/23/20 250 lb (113.4 kg)  09/13/20 239 lb 4.8 oz (108.5 kg)    Assessment: Review of patient past medical history, allergies, medications, health status, including review of consultants reports, laboratory and other test data, was performed  as part of comprehensive evaluation and provision of chronic care management services.   SDOH:  (Social Determinants of Health) assessments and interventions performed:  SDOH Interventions    Flowsheet Row Most Recent Value  SDOH Interventions   SDOH Interventions for the Following Domains Financial Strain  Financial Strain Interventions Intervention Not Indicated       CCM Care Plan  Allergies  Allergen Reactions   Demeclocycline Hives   Erythromycin Nausea And Vomiting and Other (See Comments)    Severe irritable bowel   Flagyl [Metronidazole] Nausea And Vomiting and Other (See Comments)    Severe irritable bowel   Glucophage [Metformin Hcl] Nausea And Vomiting and Other (See Comments)    "Sick" "I won't take anything that has metformin in it"   Tetracyclines & Related Hives and Rash   Diovan [Valsartan] Nausea Only        Sulfa Antibiotics Rash and Other (See Comments)    As child   Xanax [Alprazolam] Other (See Comments)    Unknown reaction    Medications Reviewed Today     Reviewed by Leona Singleton, RN (Registered Nurse) on 10/19/20 at [place] List Status: <None>   Medication Order Taking? Sig Documenting Provider Last Dose Status Informant  albuterol (VENTOLIN HFA) 108 (90 Base) MCG/ACT inhaler [id number] Yes Inhale 2 puffs into the lungs every 8 (eight) hours as needed for wheezing or shortness of breath. [name], MD Taking Active Self  aspirin EC 81 MG tablet [id number] Yes Take 1 tablet (81 mg total) by mouth daily. Fritzi Mandes, MD Taking Active Self  atorvastatin (LIPITOR) 40 MG tablet [id number] Yes Take 1 tablet (40 mg total) by mouth daily. Rise Mu, PA-C Taking Active Self  buPROPion [facility]) 75 MG tablet [id number] Yes Take 75 mg by mouth 2 (two) times daily. [provider] Taking Active   butalbital-acetaminophen-caffeine (FIORICET) 50-325-40 MG tablet [id number] Yes TAKE 1 TABLET BY MOUTH EVERY 6 HOURS AS NEEDED [name], MD  Taking Active Self  citalopram (CELEXA) 10 MG tablet [id number] Yes Take 1 tablet (10 mg total) by mouth daily. [name], MD Taking Active   clopidogrel (PLAVIX) 75 MG tablet [id number] Yes Take 1 tablet (75 mg total) by mouth daily. Rise Mu, PA-C Taking Active Self  clotrimazole-betamethasone Donalynn Furlong) cream [id number] Yes Apply 1 application topically 2 (two) times daily as needed. [provider] Taking Active Self  Continuous Blood Gluc Receiver (FREESTYLE LIBRE 2 READER) DEVI [id number]  Use to check sugar at least TID [name], MD  Active  cyanocobalamin (,VITAMIN B-12,) 1000 MCG/ML injection [id number] Yes Inject 1 mL (1,000 mcg total) into the muscle every 30 (thirty) days. Inject 1 ml (1000 mcg ) IM weekly x 4,  Then monthly thereafter [name], MD Taking Active Self  dapagliflozin propanediol (FARXIGA) 10 MG TABS tablet [id number] Yes Take 1 tablet (10 mg total) by mouth daily before breakfast. [name], MD Taking Active   diazepam (VALIUM) 5 MG tablet [id number] Yes Take 1 tablet (5 mg total) by mouth at bedtime as needed for anxiety. [name], MD Taking Active Self  diphenhydrAMINE (BENADRYL) 25 MG tablet [id number] Yes Take 50 mg by mouth daily as needed for allergies. [provider] Taking Active   diphenoxylate-atropine (LOMOTIL) 2.5-0.025 MG tablet [id number] Yes Take 1 tablet by mouth 4 (four) times daily as needed for diarrhea or loose stools. [name], MD Taking Active Self  Eszopiclone 3 MG TABS [id number] Yes Take 1 tablet (3 mg total) by mouth at bedtime. [name], MD Taking Active   hyoscyamine (ANASPAZ) 0.125 MG TBDP disintergrating tablet [id number] Yes DISSOLVE UNDER TONGUE 15 MINUTES BEFORE MEALS [name], MD Taking Active Self  insulin aspart (NOVOLOG FLEXPEN) 100 UNIT/ML FlexPen [id number] Yes Inject 8 units up to 3 times daily before meals [name], MD Taking Active Self           Med Note  [facility], BRANDY L   Fri Sep 09, 2020  2:00 PM)    insulin glargine (LANTUS SOLOSTAR) 100 UNIT/ML Solostar Pen [id number] Yes Inject 11 Units into the skin daily. [name], MD Taking Active            Med Note Shelby Mattocks, [facility] D   Wed Oct 19, 2020 10:12 AM) Increased to 15 units daily  Insulin Pen Needle (DROPLET PEN NEEDLES) 31G X 5 MM MISC [id number]  USE ONE NEEDLE SUBCUTANEOUSLY AS DIRECTED (REMOVE AND DISCARD NEEDLE IN SHARPS CONTAINER IMMEDIATELY AFTER USE) [name], MD  Active Self  ipratropium (ATROVENT) 0.06 % nasal spray [id number] Yes Place 2 sprays into both nostrils 4 (four) times daily as needed for rhinitis. [provider] Taking Active Self  loperamide (IMODIUM) 2 MG capsule [id number] Yes Take by mouth as needed. [provider] Taking Active Self  nitroGLYCERIN (NITROSTAT) 0.4 MG SL tablet [id number] Yes Place 1 tablet (0.4 mg total) under the tongue every 5 (five) minutes as needed for chest pain. Theora Gianotti, NP Taking Active   NONFORMULARY OR COMPOUNDED [place] [id number] Yes [facility]  Combination Pain Cream -  Baclofen 2%, Doxepin 5%, Gabapentin 6%, Topiramate 2%, Pentoxifylline 3% Apply 1-2 grams to affected area 3-4 times daily Qty. 120 gm 3 refills Gardiner Barefoot, DPM Taking Active Self           Med Note [facility], BRANDY L   Fri Sep 02, 2020  2:40 PM)    nystatin (MYCOSTATIN/NYSTOP) powder [id number] Yes Apply 1 g topically 4 (four) times daily as needed (rash). [provider] Taking Active   potassium chloride SA (KLOR-CON) 20 MEQ tablet [id number] Yes Take 1 tablet (20 mEq total) by mouth daily. End, Harrell Gave, MD Taking Active Self  pregabalin (LYRICA) 50 MG capsule [id number] Yes Take 1 capsule (50 mg total) by mouth 2 (two) times daily. Kathrynn Ducking, MD Taking Active   torsemide (DEMADEX) 20 MG tablet [id number] Yes Take 1 tablet (20 mg total) by mouth daily. [name], MD Taking  Active  traMADol (ULTRAM) 50 MG tablet [id number] Yes Take 0.5 tablets (25 mg total) by mouth every 12 (twelve) hours as needed for severe pain. Elmarie Shiley, MD Taking Active            Med Note [facility], BRANDY L   Fri Sep 02, 2020  2:40 PM)    umeclidinium-vilanterol [facility] ELLIPTA) 62.5-25 MCG/INH AEPB [id number] Yes Inhale 1 puff into the lungs daily. [name], MD Taking Active Self  Med List Note Wynona Canes, [place] 07/22/17 1307): CPAP            Patient Active Problem List   Diagnosis Date Noted   Hyperlipidemia associated with type 2 diabetes mellitus ([place]) 08/21/2020   Iron deficiency 06/28/2020   Skin lesion of chest wall 06/10/2020   Elevated alkaline phosphatase level 06/10/2020   Coagulation defect ([place]) 06/06/2020   Chronic heart failure with preserved ejection fraction (HFpEF) ([place]) 01/14/2020   Hyperlipidemia LDL goal <70 01/14/2020   Coronary artery disease involving native coronary artery of native heart without angina pectoris 01/13/2020   S/P cardiac catheterization 01/13/2020   Accelerating angina ([place]) 12/31/2019   Dysphagia 11/30/2019   Gastritis 08/07/2019   Lab test negative for COVID-19 virus 08/07/2019   S/P panniculectomy 06/16/2019   Major depressive disorder with current active episode 05/30/2019   Exposure to COVID-19 virus 12/25/2018   Functional diarrhea 10/22/2018   Bilateral cataracts 06/26/2018   Status post bariatric surgery 02/05/2018   GAD (generalized anxiety disorder) 02/01/2018   Rectocele 01/24/2018   Vaginal prolapse 01/04/2018   Hepatic steatosis 01/04/2018   Microalbuminuria due to type 2 diabetes mellitus ([place]) 11/21/2017   Hospital discharge follow-up 09/22/2017   Hypocalcemia 09/22/2017   Hypotension 09/22/2017   Fecal incontinence 09/22/2017   B12 deficiency 09/22/2017   Transient disorientation 08/20/2017   Rotator cuff arthropathy, right 08/13/2017   Primary localized osteoarthrosis of  shoulder 08/13/2017   Encounter for preoperative examination for general surgical procedure 08/03/2017   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 ([place]) 08/03/2017   Candidiasis, intertrigo 08/03/2017   Venous stasis dermatitis of right lower extremity 11/21/2016   Edema 12/13/2015   Hypersomnia, persistent 06/18/2015   Mechanical breakdown of implanted electronic neurostimulator of peripheral nerve ([place]) 10/16/2014   Obesity hypoventilation syndrome ([place]) 08/13/2014   Frequent falls 06/23/2014   Chronic cough 05/05/2014   Adenoma of left adrenal gland 03/24/2014   New onset of headaches after age 61 03/23/2014   Vitamin D deficiency 01/06/2014   Weight gain following gastric bypass surgery 12/03/2013   Morbid obesity ([place]) 08/25/2013   Diaphragmatic hernia 04/06/2013   Diabetic polyneuropathy ([place]) 03/25/2013   Abnormality of gait 03/25/2013   Multiple pulmonary nodules 03/20/2013   DOE (dyspnea on exertion) 03/20/2013   Anemia 08/06/2012   Iron deficiency anemia 08/06/2012   Functional disorder of bladder 07/15/2012   Incomplete bladder emptying 07/15/2012   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[place]) 07/15/2012   Urge incontinence 07/15/2012   Increased frequency of urination 07/15/2012   OSA on CPAP 03/02/2012   Insomnia secondary to anxiety 02/29/2012   Sciatica 09/05/2011   Cervical stenosis of spinal canal 06/14/2011   Restless legs syndrome 03/13/2011   Degenerative disk disease 03/13/2011   Essential hypertension 03/12/2011   Uncontrolled type 2 diabetes with neuropathy ([place]) 03/12/2011   Hearing loss, right 03/12/2011    Immunization History  Administered Date(s) Administered   Fluad Quad(high Dose 65+) 03/06/2019, 06/07/2020   Influenza Split 06/13/2011   Influenza, High  Dose Seasonal PF 08/21/2018   Influenza,inj,Quad PF,6+ Mos 05/04/2014, 05/12/2015   Influenza-Unspecified 04/15/2012   Moderna Sars-Covid-2 Vaccination 02/02/2020, 03/01/2020    Pneumococcal Conjugate-13 08/11/2013, 01/06/2014   Pneumococcal Polysaccharide-23 01/19/2010, 09/03/2018   Tdap 04/18/2016    Conditions to be addressed/monitored: CHF, CAD, HLD and DMII  Care Plan : Medication Management  Updates made by De Hollingshead, RPH-CPP since 10/20/2020 12:00 AM     Problem: Diabetes, CHF      Long-Range Goal: Disease Progression Prevention   This Visit's Progress: On track  Recent Progress: On track  Priority: High  Note:   Current Barriers:  Unable to achieve control of diabetes   Pharmacist Clinical Goal(s):  Over the next 90 days, patient will achieve control of diabetes as evidenced by improvement in A1c through collaboration with PharmD and provider  Interventions: 1:1 collaboration with [name], MD regarding development and update of comprehensive plan of care as evidenced by provider attestation and co-signature Inter-disciplinary care team collaboration (see longitudinal plan of care) Comprehensive medication review performed; medication list updated in electronic medical record   Diabetes: Uncontrolled; current treatment: Farxiga 10 mg daily, Lantus 15 units daily, Novolog up to 8 units up to TID with meals  Hx intolerance to metformin d/t diarrhea Hx Trulicity - exacerbated diarrhea  Hx glipizide - stopped previously when A1c was well controlled  Hx Januvia - stopped previously when Trulicity was started  Avoiding TZD due to HF Calls today. The old company that was supplying [employer]) needs the provider to call to cancel her Elenor Legato 14 day order, and we need to re-submit a DWO with clinical notes to [employer] for [place] 2. Called [employer] [phone]) and canceled Libre 14 order. Communicated w/ CSS rep, collaborated w/ PCP to complete a new DWO and submitted recent clinical notes. Will collaborate w/ [facility] on any other documentation needs.   HFpEF: Moderately well controlled; current regimen: torsemide  20 mg daily; Most recent LVEF normal 65-70% Encouraged to continue to weigh daily, document, and report to cardiology.  Previously recommended to continue current regimen.   Hyperlipidemia, secondary ASCVD prevention: Controlled per last lab; current treatment: atorvastatin 40 mg daily  Most recent LHC showed nonobstructive CAD w/ patent stenting.  Current antiplatelet regimen: aspirin 81 mg daily, clopidogrel 75 mg daily - plan for DAPT for full 12 months s/p stenting (12/2020) Previously recommended to continue current regimen at this time along with cardiology collaboration  Depression/Anxiety with Insomnia: Moderately well managed, given current stressors with her husband's health. Current treatment: citalopram 10 mg daily, bupropion 150 mg QAM, 75 mg QPM, diazepam 5 mg QPM, Lunesta 3 mg QPM Continue to monitor for increased sedation w/ multiple CNS sedating medications. Previously recommended to continue current regimen at this time  Chronic Diarrhea, S/p Gastric Bypass, Esophageal "growth" Moderately well managed, given patient history. Current regimen: dicyclomine 10 mg QID PRN, hyoscyamine 10 mg QID PRN Notes that she needs her esophagus stretched, but is awaiting cardiology clearance to be able to hold clopidogrel. Full 12 months of DAPT recommended.  Previously recommended to continue current regimen at this time  Nocturnal "leg cramps", as well as chronic pain More exacerbated recently per patient report. Current regimen: pregabalin 100 mg QPM PRN;  Previously recommended to continue current regimen at this time. Monitor for increased sedation with fluctuations in renal function  COPD:  Well controlled; Current regimen: Anoro 62.5/25 mcg daily, albuterol HFA PRN, notes she has not required use  recently Previously recommended to continue current regimen at this time.   Patient Goals/Self-Care Activities Over the next 90 days, patient will:  - take medications as  prescribed check blood glucose at least three times daily using CGM, document, and provide at future appointments  Follow Up Plan: Telephone follow up appointment with care management team member scheduled for: ~ 1 week as previously scheduled        Medication Assistance: None required.  Patient affirms current coverage meets needs.  Patient's preferred pharmacy is:  [name]: [phone]: [phone] [facility] [place] [address] [place] [address]Phone: [phone]: [phone] [facility] [place] [address]Phone: [phone]: [phone] Follow Up:  Patient agrees to Care Plan and Follow-up.  Plan: Telephone follow up appointment with care management team member scheduled for:  ~ 1 week as scheduled  Catie TDarnelle Maffucci PharmD, [place] CPP Clinical Pharmacist [facility] [phone]  Encounter details: CCM Time Spent       Value Time User   Time spent with patient (minutes)  14 10/20/2020 10:39 AM TDe Hollingshead RPH-CPP   Time spent performing Chart review  0 10/20/2020 10:39 AM TDe Hollingshead RPH-CPP   Total time (minutes)  14 10/20/2020 10:39 AM TDe Hollingshead RPH-CPP      Moderate to High Complex Decision Making       Value Time User   Moderate to High complex decision making  No 10/20/2020 10:39 AM TDe Hollingshead RPH-CPP      CCM Services: routine   Prior to outreach and patient consent for Chronic Care Management, I referred this patient for services after reviewing the nominated patient list or from a personal encounter with the patient.  I have personally reviewed this encounter including the documentation in this note and have collaborated with the care management provider  regarding care management and care coordination activities to include development and update of the comprehensive care plan. I am certifying that I agree with the content of this note and encounter as supervising physician.

## 2020-10-20 NOTE — Telephone Encounter (Signed)
-----   Message from Leona Singleton, RN sent at 10/20/2020 10:30 AM EDT ----- Regarding: Refill Medications Hi Janett Billow,  I spoke with Ms. [place] yesterday.  She is requesting refills for medications [facility]  The cost is a lot cheaper through [facility].  She currently needs refills for:  1----diphenoxylate-atropine (LOMOTIL) 2.5-0.025 MG tablet Dose: 1 tablet Route: Oral Frequency: 4 times daily PRN for diarrhea or loose stools  2----buPROPion (WELLBUTRIN) 75 MG tablet Dose: 75 mg Route: Oral Frequency: 2 times daily  Dispense Quantity: --     [facility] [place] [place] - [address]  [address], [place] [address]  Phone:  [phone] Fax:  [phone]   Again these are the two she spicifically told me about, but wanted make sure all her medications were called into [facility], unless it is a narcotic or something needing to start right away and can not wait to be mailed to her.  Please let me know if you have questions or concerns.  Thanks,  Hubert Azure RN, MSN RN Care Management Coordinator [place] [phone] [email or web address]

## 2020-10-20 NOTE — Telephone Encounter (Signed)
I have refilled the wellbutrin. Pt would like a refill of the lomotil sent to [facility].

## 2020-10-20 NOTE — Addendum Note (Signed)
Addended by: [name] on: 10/20/2020 12:59 PM   Modules accepted: Orders

## 2020-10-20 NOTE — Chronic Care Management (AMB) (Signed)
Chronic Care Management   CCM RN Visit Note  10/20/2020 Name: Denise Sharp MRN: [id number] DOB: [birth year]  Subjective: Denise Sharp is a 75 y.o. year old female who is a primary care patient of [name], Aris Everts, MD. The care management team was consulted for assistance with disease management and care coordination needs.    Engaged with patient by telephone for initial visit in response to provider referral for case management and/or care coordination services.   Consent to Services:  The patient was given the following information about Chronic Care Management services today, agreed to services, and gave verbal consent: 1. CCM service includes personalized support from designated clinical staff supervised by the primary care provider, including individualized plan of care and coordination with other care providers 2. 24/7 contact phone numbers for assistance for urgent and routine care needs. 3. Service will only be billed when office clinical staff spend 20 minutes or more in a month to coordinate care. 4. Only one practitioner may furnish and bill the service in a calendar month. 5.The patient may stop CCM services at any time (effective at the end of the month) by phone call to the office staff. 6. The patient will be responsible for cost sharing (co-pay) of up to 20% of the service fee (after annual deductible is met). Patient agreed to services and consent obtained.  Patient agreed to services and verbal consent obtained.   Assessment: Review of patient past medical history, allergies, medications, health status, including review of consultants reports, laboratory and other test data, was performed as part of comprehensive evaluation and provision of chronic care management services.   SDOH (Social Determinants of Health) assessments and interventions performed:  SDOH Interventions   Flowsheet Row Most Recent Value  SDOH Interventions   Food Insecurity Interventions Intervention  Not Indicated  Financial Strain Interventions Intervention Not Indicated  Housing Interventions Intervention Not Indicated  Intimate Partner Violence Interventions Intervention Not Indicated  Transportation Interventions Intervention Not Indicated       CCM Care Plan  Allergies  Allergen Reactions  . Demeclocycline Hives  . Erythromycin Nausea And Vomiting and Other (See Comments)    Severe irritable bowel  . Flagyl [Metronidazole] Nausea And Vomiting and Other (See Comments)    Severe irritable bowel  . Glucophage [Metformin Hcl] Nausea And Vomiting and Other (See Comments)    "Sick" "I won't take anything that has metformin in it"  . Tetracyclines & Related Hives and Rash  . Diovan [Valsartan] Nausea Only       . Sulfa Antibiotics Rash and Other (See Comments)    As child  . Xanax [Alprazolam] Other (See Comments)    Unknown reaction    Outpatient Encounter Medications as of 10/19/2020  Medication Sig Note  . albuterol (VENTOLIN HFA) 108 (90 Base) MCG/ACT inhaler Inhale 2 puffs into the lungs every 8 (eight) hours as needed for wheezing or shortness of breath.   [name] aspirin EC 81 MG tablet Take 1 tablet (81 mg total) by mouth daily.   [name] atorvastatin (LIPITOR) 40 MG tablet Take 1 tablet (40 mg total) by mouth daily.   . butalbital-acetaminophen-caffeine (FIORICET) 50-325-40 MG tablet TAKE 1 TABLET BY MOUTH EVERY 6 HOURS AS NEEDED   . citalopram (CELEXA) 10 MG tablet Take 1 tablet (10 mg total) by mouth daily.   . clopidogrel (PLAVIX) 75 MG tablet Take 1 tablet (75 mg total) by mouth daily.   . clotrimazole-betamethasone (LOTRISONE) cream Apply 1 application topically 2 (two)  times daily as needed.   . cyanocobalamin (,VITAMIN B-12,) 1000 MCG/ML injection Inject 1 mL (1,000 mcg total) into the muscle every 30 (thirty) days. Inject 1 ml (1000 mcg ) IM weekly x 4,  Then monthly thereafter   . dapagliflozin propanediol (FARXIGA) 10 MG TABS tablet Take 1 tablet (10 mg total) by mouth  daily before breakfast.   . diazepam (VALIUM) 5 MG tablet Take 1 tablet (5 mg total) by mouth at bedtime as needed for anxiety.   . diphenhydrAMINE (BENADRYL) 25 MG tablet Take 50 mg by mouth daily as needed for allergies.   . Eszopiclone 3 MG TABS Take 1 tablet (3 mg total) by mouth at bedtime.   . hyoscyamine (ANASPAZ) 0.125 MG TBDP disintergrating tablet DISSOLVE UNDER TONGUE 15 MINUTES BEFORE MEALS   . insulin aspart (NOVOLOG FLEXPEN) 100 UNIT/ML FlexPen Inject 8 units up to 3 times daily before meals   . insulin glargine (LANTUS SOLOSTAR) 100 UNIT/ML Solostar Pen Inject 11 Units into the skin daily. 10/19/2020: Increased to 15 units daily  . ipratropium (ATROVENT) 0.06 % nasal spray Place 2 sprays into both nostrils 4 (four) times daily as needed for rhinitis.   [name] loperamide (IMODIUM) 2 MG capsule Take by mouth as needed.   . nitroGLYCERIN (NITROSTAT) 0.4 MG SL tablet Place 1 tablet (0.4 mg total) under the tongue every 5 (five) minutes as needed for chest pain.   . NONFORMULARY OR COMPOUNDED [place]  Combination Pain Cream -  Baclofen 2%, Doxepin 5%, Gabapentin 6%, Topiramate 2%, Pentoxifylline 3% Apply 1-2 grams to affected area 3-4 times daily Qty. 120 gm 3 refills   . nystatin (MYCOSTATIN/NYSTOP) powder Apply 1 g topically 4 (four) times daily as needed (rash).   . potassium chloride SA (KLOR-CON) 20 MEQ tablet Take 1 tablet (20 mEq total) by mouth daily.   . pregabalin (LYRICA) 50 MG capsule Take 1 capsule (50 mg total) by mouth 2 (two) times daily.   [name] torsemide (DEMADEX) 20 MG tablet Take 1 tablet (20 mg total) by mouth daily.   . traMADol (ULTRAM) 50 MG tablet Take 0.5 tablets (25 mg total) by mouth every 12 (twelve) hours as needed for severe pain.   [name] umeclidinium-vilanterol (ANORO ELLIPTA) 62.5-25 MCG/INH AEPB Inhale 1 puff into the lungs daily.   . [DISCONTINUED] buPROPion (WELLBUTRIN) 75 MG tablet Take 75 mg by mouth 2 (two) times daily.   . [DISCONTINUED]  diphenoxylate-atropine (LOMOTIL) 2.5-0.025 MG tablet Take 1 tablet by mouth 4 (four) times daily as needed for diarrhea or loose stools.   . Continuous Blood Gluc Receiver (FREESTYLE LIBRE 2 READER) DEVI Use to check sugar at least TID   . Insulin Pen Needle (DROPLET PEN NEEDLES) 31G X 5 MM MISC USE ONE NEEDLE SUBCUTANEOUSLY AS DIRECTED (REMOVE AND DISCARD NEEDLE IN SHARPS CONTAINER IMMEDIATELY AFTER USE)    No facility-administered encounter medications on file as of 10/19/2020.    Patient Active Problem List   Diagnosis Date Noted  . Hyperlipidemia associated with type 2 diabetes mellitus ([place]) 08/21/2020  . Iron deficiency 06/28/2020  . Skin lesion of chest wall 06/10/2020  . Elevated alkaline phosphatase level 06/10/2020  . Coagulation defect ([place]) 06/06/2020  . Chronic heart failure with preserved ejection fraction (HFpEF) ([place]) 01/14/2020  . Hyperlipidemia LDL goal <70 01/14/2020  . Coronary artery disease involving native coronary artery of native heart without angina pectoris 01/13/2020  . S/P cardiac catheterization 01/13/2020  . Accelerating angina ([place]) 12/31/2019  . Dysphagia 11/30/2019  .  Gastritis 08/07/2019  . Lab test negative for COVID-19 virus 08/07/2019  . S/P panniculectomy 06/16/2019  . Major depressive disorder with current active episode 05/30/2019  . Exposure to COVID-19 virus 12/25/2018  . Functional diarrhea 10/22/2018  . Bilateral cataracts 06/26/2018  . Status post bariatric surgery 02/05/2018  . GAD (generalized anxiety disorder) 02/01/2018  . Rectocele 01/24/2018  . Vaginal prolapse 01/04/2018  . Hepatic steatosis 01/04/2018  . Microalbuminuria due to type 2 diabetes mellitus ([place]) 11/21/2017  . Hospital discharge follow-up 09/22/2017  . Hypocalcemia 09/22/2017  . Hypotension 09/22/2017  . Fecal incontinence 09/22/2017  . B12 deficiency 09/22/2017  . Transient disorientation 08/20/2017  . Rotator cuff arthropathy, right 08/13/2017  . Primary  localized osteoarthrosis of shoulder 08/13/2017  . Encounter for preoperative examination for general surgical procedure 08/03/2017  . 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 Candidiasis, intertrigo 08/03/2017  . 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 Edema 12/13/2015  . Hypersomnia, persistent 06/18/2015  . Mechanical breakdown of implanted electronic neurostimulator of peripheral nerve ([place]) 10/16/2014  . Obesity hypoventilation syndrome ([place]) 08/13/2014  . Frequent falls 06/23/2014  . Chronic cough 05/05/2014  . Adenoma of left adrenal gland 03/24/2014  . New onset of headaches after age 57 03/23/2014  . Vitamin D deficiency 01/06/2014  . Weight gain following gastric bypass surgery 12/03/2013  . Morbid obesity ([place]) 08/25/2013  . Diaphragmatic hernia 04/06/2013  . Diabetic polyneuropathy ([place]) 03/25/2013  . Abnormality of gait 03/25/2013  . Multiple pulmonary nodules 03/20/2013  . DOE (dyspnea on exertion) 03/20/2013  . Anemia 08/06/2012  . Iron deficiency anemia 08/06/2012  . Functional disorder of bladder 07/15/2012  . Incomplete bladder emptying 07/15/2012  . 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 Urge incontinence 07/15/2012  . Increased frequency of urination 07/15/2012  . OSA on CPAP 03/02/2012  . Insomnia secondary to anxiety 02/29/2012  . Sciatica 09/05/2011  . Cervical stenosis of spinal canal 06/14/2011  . Restless legs syndrome 03/13/2011  . Degenerative disk disease 03/13/2011  . Essential hypertension 03/12/2011  . Uncontrolled type 2 diabetes with neuropathy ([place]) 03/12/2011  . Hearing loss, right 03/12/2011    Conditions to be addressed/monitored:CHF and DMII  Care Plan : Heart Failure (Adult)  Updates made by Leona Singleton, RN since 10/20/2020 12:00 AM  Problem: Symptom Exacerbation (Heart Failure)   Priority: Medium  Long-Range Goal: Symptom Exacerbation Prevented or Minimized    Start Date: 10/19/2020  Expected End Date: 04/14/2021  Priority: Medium  Current Barriers:  [name] Knowledge deficit related to basic heart failure pathophysiology and self care management patient reporting an increase in shortness of breath over the past few months.  Does state it has gotten better as she has become a little more active.  Weighs daily, last weighing 242.6 pounds (239-243 typical range).  Denies any swelling in ankles or abdomen.  Patient stating she now gets up early, instead of sleeping in.  Plans to start doing water exercises. Case Manager Clinical Goal(s):  . patient will weigh self daily and record . patient will verbalize understanding of Heart Failure Action Plan and when to call doctor . patient will take all Heart Failure mediations as prescribed . patient will weigh daily and record (notifying MD of 3 lb weight gain over night or 5 lb in a week) Interventions:  . Collaboration with [name], MD regarding development and update of comprehensive plan of care as evidenced by provider attestation and co-signature .  Inter-disciplinary care team collaboration (see longitudinal plan of care) . Basic overview and discussion of pathophysiology of Heart Failure reviewed  . Provided verbal education on low sodium diet . Assessed need for readable accurate scales in home . Provided education about placing scale on hard, flat surface . Advised patient to weigh each morning after emptying bladder . Discussed importance of daily weight and advised patient to weigh and record daily . Reviewed role of diuretics in prevention of fluid overload and management of heart failure . Barriers to lifestyle changes reviewed and addressed . Barriers to treatment reviewed and addressed . Depression screen reviewed . Medication-adherence assessment completed . Rescue (action) plan reviewed . Self-awareness of signs/symptoms of worsening disease encouraged . Activity or exercise based on  tolerance encouraged and encouraged to pace activity allowing for rest . Reviewed foods high in sodium, discussed eating fresh fruits and vegetables and avoiding canned and processed foods, encouraged to continue to read food labels Patient Goals/Self-Care Activities: . Call office if I gain more than 2 pounds in one day or 5 pounds in one week . Watch for swelling in feet, ankles and legs every day . Weigh myself daily, first thing in morning after emptying bladder . Record weighs in log for provider review . Eat fresh or frozen vegetables, avoid canned and processed foods . Increase activity as tolerated Follow Up Plan: The care management team will reach out to the patient again over the next 20 business days.     Care Plan : Diabetes Type 2 (Adult)  Updates made by Leona Singleton, RN since 10/20/2020 12:00 AM  Problem: Glycemic Management (Diabetes, Type 2)   Priority: High  Goal: Patient will report no sustained fasting blood sugars greater than 200 within the next 60 days   Start Date: 10/19/2020  Expected End Date: 04/14/2021  Priority: High  Objective:  Lab Results  Component Value Date   HGBA1C 8.5 (H) 05/24/2020  Current Barriers:  [name] Knowledge Deficits related to basic Diabetes pathophysiology and self care/management as evidenced by patient reporting elevated blood sugars >300 recently.  Acknowledges increase in insulin dose per provider instructions.  Fasting blood sugar this morning was 209. Case Manager Clinical Goal(s):  . patient will demonstrate improved adherence to prescribed treatment plan for diabetes self care/management as evidenced by: 3 times a day monitoring and recording of CBG,  adherence to ADA/ carb modified diet, adherence to prescribed medication regimen, contacting provider for new or worsened symptoms or questions Interventions:  . Collaboration with [name], MD regarding development and update of comprehensive plan of care as evidenced by provider  attestation and co-signature . Inter-disciplinary care team collaboration (see longitudinal plan of care) . Provided education to patient about basic DM disease process . Reviewed medications with patient and discussed importance of medication adherence . Discussed plans with patient for ongoing care management follow up and provided patient with direct contact information for care management team . Provided patient with written educational materials related to hypo and hyperglycemia and importance of correct treatment . Advised patient, providing education and rationale, to check cbg 3 times a day and record, calling CCM Pharmacist or PCP for findings outside established parameters.   . Barriers to adherence to treatment plan identified . Blood glucose readings reviewed . Resources required to improve adherence to care identified . Self-awareness of signs/symptoms of hypo or hyperglycemia encouraged . Use of blood glucose monitoring log promoted . Discussed minimizing drinking sweet tea and reviewed healthier low  carbohydrate food and drink options . Confirmed patient has not had yearly eye exam and encouraged patient to schedule as soon as possible . Encouraged to continue to work with CCM Pharmacist . Encouraged to attend all scheduled provider appointments  . Discussed and encouraged to adhere to prescribed ADA/carb modified  Assessed barriers to management plan, such as food insecurity, age, developmental ability, depression, anxiety, fear of hypoglycemia or weight gain, as well as medication cost, side effects and complicated regimen.  Patient Goals/Self-Care Activities: . Check blood sugar at least 3 times a day . Check blood sugar if I feel it is too high or too low . Enter blood sugar readings and medication/insulin into daily log for provider review . Schedule yearly diabetic eye exam . Contact CCM Pharmacist or PCP if blood sugars sustained over 250 consistently . Try to avoid  drinking sweet tea with regular sugar Follow Up Plan: The care management team will reach out to the patient again over the next 20 business days.       Plan:The care management team will reach out to the patient again over the next 20 business days.  Hubert Azure RN, MSN RN Care Management Coordinator [place] [phone] [email or web address]

## 2020-10-20 NOTE — Telephone Encounter (Signed)
Please REMIND pattersom that lomotil is a controlled substance bc it contains atropine so it must go to [facility]

## 2020-10-20 NOTE — Telephone Encounter (Signed)
Pt is aware that rxs have been refilled and that the [facility]il must go to [facility] because it is a controlled substance. Pt gave a verbal understanding.

## 2020-10-20 NOTE — Telephone Encounter (Signed)
Returned call. See CCM documentation 

## 2020-10-20 NOTE — Patient Instructions (Addendum)
Visit Information   PATIENT GOALS:  Goals Addressed              This Visit's Progress   .  (RNCM) Monitor and Manage My Blood Sugar-Diabetes Type 2        Timeframe:  Long-Range Goal Priority:  High Start Date:     10/19/20                      Expected End Date:   04/14/21                    Follow Up Date 11/04/20    . Check blood sugar at least 3 times a day . Check blood sugar if I feel it is too high or too low . Enter blood sugar readings and medication/insulin into daily log for provider review . Schedule yearly diabetic eye exam . Contact CCM Pharmacist or PCP if blood sugars sustained over 250 consistently . Try to avoid drinking sweet tea with regular sugar   Why is this important?    Checking your blood sugar at home helps to keep it from getting very high or very low.   Writing the results in a diary or log helps the doctor know how to care for you.   Your blood sugar log should have the time, date and the results.   Also, write down the amount of insulin or other medicine that you take.   Other information, like what you ate, exercise done and how you were feeling, will also be helpful.     Notes:    [name]  (RNCM) Track and Manage Fluids and Swelling-Heart Failure        Timeframe:  Long-Range Goal Priority:  Medium Start Date: 10/19/20                            Expected End Date:    04/14/21                   Follow Up Date 11/04/20    . Call office if I gain more than 2 pounds in one day or 5 pounds in one week . Watch for swelling in feet, ankles and legs every day . Weigh myself daily, first thing in morning after emptying bladder . Record weighs in log for provider review . Eat fresh or frozen vegetables, avoid canned and processed foods . Increase activity as tolerated   Why is this important?    It is important to check your weight daily and watch how much salt and liquids you have.   It will help you to manage your heart failure.    Notes:      Consent to CCM Services: Ms. Trimble was given information about Chronic Care Management services today including:  1. CCM service includes personalized support from designated clinical staff supervised by her physician, including individualized plan of care and coordination with other care providers 2. 24/7 contact phone numbers for assistance for urgent and routine care needs. 3. Service will only be billed when office clinical staff spend 20 minutes or more in a month to coordinate care. 4. Only one practitioner may furnish and bill the service in a calendar month. 5. The patient may stop CCM services at any time (effective at the end of the month) by phone call to the office staff. 6. The patient will be responsible for cost sharing (co-pay)  of up to 20% of the service fee (after annual deductible is met).  Patient agreed to services and verbal consent obtained.   Patient verbalizes understanding of instructions provided today and agrees to view in [place].   The care management team will reach out to the patient again over the next 20 business days.   Hubert Azure RN, MSN RN Care Management Coordinator [place] [phone] [email or web address]   CLINICAL CARE PLAN: Patient Care Plan: Heart Failure (Adult)  Problem Identified: Symptom Exacerbation (Heart Failure)   Priority: Medium  Long-Range Goal: Symptom Exacerbation Prevented or Minimized   Start Date: 10/19/2020  Expected End Date: 04/14/2021  Priority: Medium  Current Barriers:  [name] Knowledge deficit related to basic heart failure pathophysiology and self care management patient reporting an increase in shortness of breath over the past few months.  Does state it has gotten better as she has become a little more active.  Weighs daily, last weighing 242.6 pounds (239-243 typical range).  Denies any swelling in ankles or abdomen.  Patient stating she now gets up early, instead of  sleeping in.  Plans to start doing water exercises. Case Manager Clinical Goal(s):  . patient will weigh self daily and record . patient will verbalize understanding of Heart Failure Action Plan and when to call doctor . patient will take all Heart Failure mediations as prescribed . patient will weigh daily and record (notifying MD of 3 lb weight gain over night or 5 lb in a week) Interventions:  . Collaboration with [name], MD regarding development and update of comprehensive plan of care as evidenced by provider attestation and co-signature . Inter-disciplinary care team collaboration (see longitudinal plan of care) . Basic overview and discussion of pathophysiology of Heart Failure reviewed  . Provided verbal education on low sodium diet . Assessed need for readable accurate scales in home . Provided education about placing scale on hard, flat surface . Advised patient to weigh each morning after emptying bladder . Discussed importance of daily weight and advised patient to weigh and record daily . Reviewed role of diuretics in prevention of fluid overload and management of heart failure . Barriers to lifestyle changes reviewed and addressed . Barriers to treatment reviewed and addressed . Depression screen reviewed . Medication-adherence assessment completed . Rescue (action) plan reviewed . Self-awareness of signs/symptoms of worsening disease encouraged . Activity or exercise based on tolerance encouraged and encouraged to pace activity allowing for rest . Reviewed foods high in sodium, discussed eating fresh fruits and vegetables and avoiding canned and processed foods, encouraged to continue to read food labels Patient Goals/Self-Care Activities: . Call office if I gain more than 2 pounds in one day or 5 pounds in one week . Watch for swelling in feet, ankles and legs every day . Weigh myself daily, first thing in morning after emptying bladder . Record weighs in log for  provider review . Eat fresh or frozen vegetables, avoid canned and processed foods . Increase activity as tolerated Follow Up Plan: The care management team will reach out to the patient again over the next 20 business days.     Patient Care Plan: Diabetes Type 2 (Adult)  Problem Identified: Glycemic Management (Diabetes, Type 2)   Priority: High  Goal: Patient will report no sustained fasting blood sugars greater than 200 within the next 60 days   Start Date: 10/19/2020  Expected End Date: 04/14/2021  Priority: High  Objective:  Lab Results  Component Value Date  HGBA1C 8.5 (H) 05/24/2020  Current Barriers:  [name] Knowledge Deficits related to basic Diabetes pathophysiology and self care/management as evidenced by patient reporting elevated blood sugars >300 recently.  Acknowledges increase in insulin dose per provider instructions.  Fasting blood sugar this morning was 209. Case Manager Clinical Goal(s):  . patient will demonstrate improved adherence to prescribed treatment plan for diabetes self care/management as evidenced by: 3 times a day monitoring and recording of CBG,  adherence to ADA/ carb modified diet, adherence to prescribed medication regimen, contacting provider for new or worsened symptoms or questions Interventions:  . Collaboration with [name], MD regarding development and update of comprehensive plan of care as evidenced by provider attestation and co-signature . Inter-disciplinary care team collaboration (see longitudinal plan of care) . Provided education to patient about basic DM disease process . Reviewed medications with patient and discussed importance of medication adherence . Discussed plans with patient for ongoing care management follow up and provided patient with direct contact information for care management team . Provided patient with written educational materials related to hypo and hyperglycemia and importance of correct treatment . Advised patient,  providing education and rationale, to check cbg 3 times a day and record, calling CCM Pharmacist or PCP for findings outside established parameters.   . Barriers to adherence to treatment plan identified . Blood glucose readings reviewed . Resources required to improve adherence to care identified . Self-awareness of signs/symptoms of hypo or hyperglycemia encouraged . Use of blood glucose monitoring log promoted . Discussed minimizing drinking sweet tea and reviewed healthier low carbohydrate food and drink options . Confirmed patient has not had yearly eye exam and encouraged patient to schedule as soon as possible . Encouraged to continue to work with CCM Pharmacist . Encouraged to attend all scheduled provider appointments  . Discussed and encouraged to adhere to prescribed ADA/carb modified  Assessed barriers to management plan, such as food insecurity, age, developmental ability, depression, anxiety, fear of hypoglycemia or weight gain, as well as medication cost, side effects and complicated regimen.  Patient Goals/Self-Care Activities: . Check blood sugar at least 3 times a day . Check blood sugar if I feel it is too high or too low . Enter blood sugar readings and medication/insulin into daily log for provider review . Schedule yearly diabetic eye exam . Contact CCM Pharmacist or PCP if blood sugars sustained over 250 consistently . Try to avoid drinking sweet tea with regular sugar Follow Up Plan: The care management team will reach out to the patient again over the next 20 business days.        Low-Sodium Eating Plan Sodium, which is an element that makes up salt, helps you maintain a healthy balance of fluids in your body. Too much sodium can increase your blood pressure and cause fluid and waste to be held in your body. Your health care provider or dietitian may recommend following this plan if you have high blood pressure (hypertension), kidney disease, liver disease, or  heart failure. Eating less sodium can help lower your blood pressure, reduce swelling, and protect your heart, liver, and kidneys. What are tips for following this plan? Reading food labels  The Nutrition Facts label lists the amount of sodium in one serving of the food. If you eat more than one serving, you must multiply the listed amount of sodium by the number of servings.  Choose foods with less than 140 mg of sodium per serving.  Avoid foods with 300 mg  of sodium or more per serving. Shopping  Look for lower-sodium products, often labeled as "low-sodium" or "no salt added."  Always check the sodium content, even if foods are labeled as "unsalted" or "no salt added."  Buy fresh foods. ? Avoid canned foods and pre-made or frozen meals. ? Avoid canned, cured, or processed meats.  Buy breads that have less than 80 mg of sodium per slice.   Cooking  Eat more home-cooked food and less restaurant, buffet, and fast food.  Avoid adding salt when cooking. Use salt-free seasonings or herbs instead of table salt or sea salt. Check with your health care provider or pharmacist before using salt substitutes.  Cook with plant-based oils, such as canola, sunflower, or olive oil.   Meal planning  When eating at a restaurant, ask that your food be prepared with less salt or no salt, if possible. Avoid dishes labeled as brined, pickled, cured, smoked, or made with soy sauce, miso, or teriyaki sauce.  Avoid foods that contain MSG (monosodium glutamate). MSG is sometimes added to [place] food, bouillon, and some canned foods.  Make meals that can be grilled, baked, poached, roasted, or steamed. These are generally made with less sodium. General information Most people on this plan should limit their sodium intake to 1,500-2,000 mg (milligrams) of sodium each day. What foods should I eat? Fruits Fresh, frozen, or canned fruit. Fruit juice. Vegetables Fresh or frozen vegetables. "No salt added"  canned vegetables. "No salt added" tomato sauce and paste. Low-sodium or reduced-sodium tomato and vegetable juice. Grains Low-sodium cereals, including oats, puffed wheat and rice, and shredded wheat. Low-sodium crackers. Unsalted rice. Unsalted pasta. Low-sodium bread. Whole-grain breads and whole-grain pasta. Meats and other proteins Fresh or frozen (no salt added) meat, poultry, seafood, and fish. Low-sodium canned tuna and salmon. Unsalted nuts. Dried peas, beans, and lentils without added salt. Unsalted canned beans. Eggs. Unsalted nut butters. Dairy Milk. Soy milk. Cheese that is naturally low in sodium, such as ricotta cheese, fresh mozzarella, or Swiss cheese. Low-sodium or reduced-sodium cheese. Cream cheese. Yogurt. Seasonings and condiments Fresh and dried herbs and spices. Salt-free seasonings. Low-sodium mustard and ketchup. Sodium-free salad dressing. Sodium-free light mayonnaise. Fresh or refrigerated horseradish. Lemon juice. Vinegar. Other foods Homemade, reduced-sodium, or low-sodium soups. Unsalted popcorn and pretzels. Low-salt or salt-free chips. The items listed above may not be a complete list of foods and beverages you can eat. Contact a dietitian for more information. What foods should I avoid? Vegetables Sauerkraut, pickled vegetables, and relishes. Olives. [place] fries. Onion rings. Regular canned vegetables (not low-sodium or reduced-sodium). Regular canned tomato sauce and paste (not low-sodium or reduced-sodium). Regular tomato and vegetable juice (not low-sodium or reduced-sodium). Frozen vegetables in sauces. Grains Instant hot cereals. Bread stuffing, pancake, and biscuit mixes. Croutons. Seasoned rice or pasta mixes. Noodle soup cups. Boxed or frozen macaroni and cheese. Regular salted crackers. Self-rising flour. Meats and other proteins Meat or fish that is salted, canned, smoked, spiced, or pickled. Precooked or cured meat, such as sausages or meat loaves.  [place] Salines. Ham. Pepperoni. Hot dogs. Corned beef. Chipped beef. Salt pork. Jerky. Pickled herring. Anchovies and sardines. Regular canned tuna. Salted nuts. Dairy Processed cheese and cheese spreads. Hard cheeses. Cheese curds. Blue cheese. Feta cheese. String cheese. Regular cottage cheese. Buttermilk. Canned milk. Fats and oils Salted butter. Regular margarine. Ghee. Bacon fat. Seasonings and condiments Onion salt, garlic salt, seasoned salt, table salt, and sea salt. Canned and packaged gravies. Worcestershire sauce. Tartar sauce. Barbecue sauce.  Teriyaki sauce. Soy sauce, including reduced-sodium. Steak sauce. Fish sauce. Oyster sauce. Cocktail sauce. Horseradish that you find on the shelf. Regular ketchup and mustard. Meat flavorings and tenderizers. Bouillon cubes. Hot sauce. Pre-made or packaged marinades. Pre-made or packaged taco seasonings. Relishes. Regular salad dressings. Salsa. Other foods Salted popcorn and pretzels. Corn chips and puffs. Potato and tortilla chips. Canned or dried soups. Pizza. Frozen entrees and pot pies. The items listed above may not be a complete list of foods and beverages you should avoid. Contact a dietitian for more information. Summary  Eating less sodium can help lower your blood pressure, reduce swelling, and protect your heart, liver, and kidneys.  Most people on this plan should limit their sodium intake to 1,500-2,000 mg (milligrams) of sodium each day.  Canned, boxed, and frozen foods are high in sodium. Restaurant foods, fast foods, and pizza are also very high in sodium. You also get sodium by adding salt to food.  Try to cook at home, eat more fresh fruits and vegetables, and eat less fast food and canned, processed, or prepared foods. This information is not intended to replace advice given to you by your health care provider. Make sure you discuss any questions you have with your health care provider. Document Revised: 08/07/2019 Document Reviewed:  06/03/2019 Elsevier Patient Education  2021 [employer].

## 2020-10-20 NOTE — Patient Instructions (Signed)
Visit Information  PATIENT GOALS: Goals Addressed              This Visit's Progress     Patient Stated   .  Medication Monitoring (pt-stated)        Patient Goals/Self-Care Activities . Over the next 90 days, patient will:  - take medications as prescribed check blood glucose at least three times daily using CGM, document, and provide at future appointments        Patient verbalizes understanding of instructions provided today and agrees to view in [place].    Plan: Telephone follow up appointment with care management team member scheduled for:  ~ 1 week as scheduled  Catie Darnelle Maffucci, PharmD, [place], [place] Clinical Pharmacist [employer] at [employer] [phone]

## 2020-10-24 DIAGNOSIS — M6281 Muscle weakness (generalized): Secondary | ICD-10-CM | POA: Diagnosis not present

## 2020-10-24 DIAGNOSIS — R2681 Unsteadiness on feet: Secondary | ICD-10-CM | POA: Diagnosis not present

## 2020-10-24 DIAGNOSIS — R2689 Other abnormalities of gait and mobility: Secondary | ICD-10-CM | POA: Diagnosis not present

## 2020-10-26 ENCOUNTER — Ambulatory Visit: Payer: Medicare Other | Admitting: Pharmacist

## 2020-10-26 DIAGNOSIS — R519 Headache, unspecified: Secondary | ICD-10-CM

## 2020-10-26 DIAGNOSIS — I2510 Atherosclerotic heart disease of native coronary artery without angina pectoris: Secondary | ICD-10-CM

## 2020-10-26 DIAGNOSIS — I10 Essential (primary) hypertension: Secondary | ICD-10-CM

## 2020-10-26 DIAGNOSIS — E1142 Type 2 diabetes mellitus with diabetic polyneuropathy: Secondary | ICD-10-CM

## 2020-10-26 MED ORDER — LANTUS SOLOSTAR 100 UNIT/ML ~~LOC~~ SOPN: 15.0000 [IU] | PEN_INJECTOR | Freq: Every day | SUBCUTANEOUS | 3 refills | Status: DC

## 2020-10-26 NOTE — Chronic Care Management (AMB) (Signed)
Chronic Care Management Pharmacy Note  10/26/2020 Name:  Denise Sharp MRN:  [id number] DOB:  1945-12-13  Subjective: Denise Sharp is an 75 y.o. year old female who is a primary patient of [name], Aris Everts, MD.  The CCM team was consulted for assistance with disease management and care coordination needs.    Engaged with patient by telephone for follow up visit in response to provider referral for pharmacy case management and/or care coordination services.   Consent to Services:  The patient was given information about Chronic Care Management services, agreed to services, and gave verbal consent prior to initiation of services.  Please see initial visit note for detailed documentation.   Patient Care Team: [name], MD as PCP - General (Internal Medicine) End, Harrell Gave, MD as PCP - Cardiology (Cardiology) Chiropractic, Freddi Che as Referring Physician De Hollingshead, RPH-CPP (Pharmacist) Leona Singleton, RN as Case Manager  Recent office visits: None since our last call  Recent consult visits: None since our last call  Hospital visits:  2/22 - heart catheterization  Objective:  Lab Results  Component Value Date   CREATININE 1.50 (H) 09/28/2020   CREATININE 1.47 (H) 09/20/2020   CREATININE 1.66 (H) 09/15/2020    Lab Results  Component Value Date   HGBA1C 8.5 (H) 05/24/2020   Last diabetic Eye exam:  Lab Results  Component Value Date/Time   HMDIABEYEEXA No Retinopathy 10/15/2016 12:00 AM    Last diabetic Foot exam:  Lab Results  Component Value Date/Time   HMDIABFOOTEX abnormal 06/23/2014 12:00 AM        Component Value Date/Time   CHOL 134 05/24/2020 1532   CHOL 155 02/27/2014 0420   TRIG 128.0 05/24/2020 1532   TRIG 121 02/27/2014 0420   HDL 60.40 05/24/2020 1532   HDL 43 02/27/2014 0420   CHOLHDL 2 05/24/2020 1532   VLDL 25.6 05/24/2020 1532   VLDL 24 02/27/2014 0420   LDLCALC 48 05/24/2020 1532   LDLCALC 88 02/27/2014 0420    LDLDIRECT 111.0 06/16/2015 0936    Hepatic Function Latest Ref Rng & Units 07/17/2020 06/07/2020 06/07/2020  Total Protein 6.5 - 8.1 g/dL 7.6 - 6.9  Albumin 3.5 - 5.0 g/dL 3.5 - 3.6  AST 15 - 41 U/L 18 - 12  ALT 0 - 44 U/L 12 - 9  Alk Phosphatase 38 - 126 U/L 129(H) 153(H) 134(H)  Total Bilirubin 0.3 - 1.2 mg/dL 0.5 - 0.3  Bilirubin, Direct 0.0 - 0.3 mg/dL - - 0.1    Lab Results  Component Value Date/Time   TSH 3.695 07/17/2020 11:50 AM   TSH 3.50 01/01/2019 01:45 PM   TSH 2.279 08/20/2017 05:01 AM   TSH 4.400 06/16/2015 09:36 AM   FREET4 1.10 05/03/2015 04:20 PM    CBC Latest Ref Rng & Units 08/31/2020 07/17/2020 06/07/2020  WBC 4.0 - 10.5 K/uL 9.7 10.5 11.1(H)  Hemoglobin 12.0 - 15.0 g/dL 13.7 11.4(L) 10.1(L)  Hematocrit 36.0 - 46.0 % 41.2 38.6 32.8(L)  Platelets 150 - 400 K/uL 277 354 420.0(H)    Lab Results  Component Value Date/Time   VD25OH 21.65 (L) 06/04/2017 02:12 PM   VD25OH 23.44 (L) 11/19/2016 02:31 PM    Clinical ASCVD: Yes   Social History   Tobacco Use  Smoking Status Never Smoker  Smokeless Tobacco Never Used   BP Readings from Last 3 Encounters:  09/28/20 100/60  09/23/20 118/72  09/13/20 (!) 143/78   Pulse Readings from Last 3 Encounters:  09/28/20 73  09/23/20 77  09/13/20 91   Wt Readings from Last 3 Encounters:  09/28/20 251 lb 4 oz (114 kg)  09/23/20 250 lb (113.4 kg)  09/13/20 239 lb 4.8 oz (108.5 kg)    Assessment: Review of patient past medical history, allergies, medications, health status, including review of consultants reports, laboratory and other test data, was performed as part of comprehensive evaluation and provision of chronic care management services.   SDOH:  (Social Determinants of Health) assessments and interventions performed:  SDOH Interventions   Flowsheet Row Most Recent Value  SDOH Interventions   SDOH Interventions for the Following Domains Physical Activity  [starting physical therapy]      CCM Care  Plan  Allergies  Allergen Reactions  . Demeclocycline Hives  . Erythromycin Nausea And Vomiting and Other (See Comments)    Severe irritable bowel  . Flagyl [Metronidazole] Nausea And Vomiting and Other (See Comments)    Severe irritable bowel  . Glucophage [Metformin Hcl] Nausea And Vomiting and Other (See Comments)    "Sick" "I won't take anything that has metformin in it"  . Tetracyclines & Related Hives and Rash  . Diovan [Valsartan] Nausea Only       . Sulfa Antibiotics Rash and Other (See Comments)    As child  . Xanax [Alprazolam] Other (See Comments)    Unknown reaction    Medications Reviewed Today    Reviewed by De Hollingshead, RPH-CPP (Pharmacist) on 10/26/20 at 1359  Med List Status: <None>  Medication Order Taking? Sig Documenting Provider Last Dose Status Informant  albuterol (VENTOLIN HFA) 108 (90 Base) MCG/ACT inhaler [id number] Yes Inhale 2 puffs into the lungs every 8 (eight) hours as needed for wheezing or shortness of breath. [name], MD Taking Active Self  aspirin EC 81 MG tablet [id number] Yes Take 1 tablet (81 mg total) by mouth daily. Fritzi Mandes, MD Taking Active Self  atorvastatin (LIPITOR) 40 MG tablet [id number] Yes Take 1 tablet (40 mg total) by mouth daily. Rise Mu, PA-C Taking Active Self  buPROPion [facility]) 75 MG tablet [id number] Yes Take 1 tablet (75 mg total) by mouth 2 (two) times daily. [name], MD Taking Active   butalbital-acetaminophen-caffeine (FIORICET) [phone] MG tablet [id number] Yes TAKE 1 TABLET BY MOUTH EVERY 6 HOURS AS NEEDED [name], MD Taking Active Self  citalopram (CELEXA) 10 MG tablet [id number] Yes Take 1 tablet (10 mg total) by mouth daily. [name], MD Taking Active   clopidogrel (PLAVIX) 75 MG tablet [id number] Yes Take 1 tablet (75 mg total) by mouth daily. Rise Mu, PA-C Taking Active Self  clotrimazole-betamethasone Donalynn Furlong) cream [id number] Yes Apply 1 application topically 2 (two)  times daily as needed. [provider] Taking Active Self  Continuous Blood Gluc Receiver (FREESTYLE LIBRE 2 READER) DEVI [id number] Yes Use to check sugar at least TID [name], MD Taking Active   cyanocobalamin (,VITAMIN B-12,) 1000 MCG/ML injection [id number] Yes Inject 1 mL (1,000 mcg total) into the muscle every 30 (thirty) days. Inject 1 ml (1000 mcg ) IM weekly x 4,  Then monthly thereafter [name], MD Taking Active Self  dapagliflozin propanediol (FARXIGA) 10 MG TABS tablet [id number] Yes Take 1 tablet (10 mg total) by mouth daily before breakfast. [name], MD Taking Active   diazepam (VALIUM) 5 MG tablet [id number] Yes Take 1 tablet (5 mg total) by mouth at bedtime as needed for anxiety. [name],  MD Taking Active Self  diphenhydrAMINE (BENADRYL) 25 MG tablet [id number] Yes Take 50 mg by mouth daily as needed for allergies. [provider] Taking Active   diphenoxylate-atropine (LOMOTIL) 2.5-0.025 MG tablet [id number] Yes Take 1 tablet by mouth 4 (four) times daily as needed for diarrhea or loose stools. Sherlene Shams, MD Taking Active   Eszopiclone 3 MG TABS [id number] Yes Take 1 tablet (3 mg total) by mouth at bedtime. Sherlene Shams, MD Taking Active   fexofenadine [facility]) 180 MG tablet [id number] Yes Take 180 mg by mouth daily. [provider] Taking Active   hyoscyamine (ANASPAZ) 0.125 MG TBDP disintergrating tablet [id number] Yes DISSOLVE UNDER TONGUE 15 MINUTES BEFORE MEALS Sherlene Shams, MD Taking Active Self  insulin aspart (NOVOLOG FLEXPEN) 100 UNIT/ML FlexPen [id number] Yes Inject 8 units up to 3 times daily before meals Sherlene Shams, MD Taking Active Self           Med Note [facility], BRANDY L   Fri Sep 09, 2020  2:00 PM)    insulin glargine (LANTUS SOLOSTAR) 100 UNIT/ML Solostar Pen [id number] Yes Inject 11 Units into the skin daily. Sherlene Shams, MD Taking Active            Med Note Gretchen Short, [facility] D   Wed Oct 19, 2020 10:12 AM) Increased to 15 units daily  Insulin Pen Needle (DROPLET PEN NEEDLES) 31G X 5 MM MISC [id number]  USE ONE NEEDLE SUBCUTANEOUSLY AS DIRECTED (REMOVE AND DISCARD NEEDLE IN SHARPS CONTAINER IMMEDIATELY AFTER USE) Sherlene Shams, MD  Active Self  ipratropium (ATROVENT) 0.06 % nasal spray [id number] Yes Place 2 sprays into both nostrils 4 (four) times daily as needed for rhinitis. [provider] Taking Active Self  loperamide (IMODIUM) 2 MG capsule [id number] Yes Take by mouth as needed. [provider] Taking Active Self  nitroGLYCERIN (NITROSTAT) 0.4 MG SL tablet [id number] No Place 1 tablet (0.4 mg total) under the tongue every 5 (five) minutes as needed for chest pain.  Patient not taking: Reported on 10/26/2020   Creig Hines, NP Not Taking Active   NONFORMULARY OR COMPOUNDED ITEM [id number] Yes [facility]  Combination Pain Cream -  Baclofen 2%, Doxepin 5%, Gabapentin 6%, Topiramate 2%, Pentoxifylline 3% Apply 1-2 grams to affected area 3-4 times daily Qty. 120 gm 3 refills Helane Gunther, DPM Taking Active Self           Med Note [facility], BRANDY L   Fri Sep 02, 2020  2:40 PM)    nystatin (MYCOSTATIN/NYSTOP) powder [id number] No Apply 1 g topically 4 (four) times daily as needed (rash).  Patient not taking: Reported on 10/26/2020   [provider] Not Taking Active   potassium chloride SA (KLOR-CON) 20 MEQ tablet [id number] Yes Take 1 tablet (20 mEq total) by mouth daily. End, Cristal Deer, MD Taking Active Self  pregabalin (LYRICA) 50 MG capsule [id number] Yes Take 1 capsule (50 mg total) by mouth 2 (two) times daily. York Spaniel, MD Taking Active   torsemide (DEMADEX) 20 MG tablet [id number] Yes Take 1 tablet (20 mg total) by mouth daily. End, Cristal Deer, MD Taking Active   traMADol (ULTRAM) 50 MG tablet [id number] Yes Take 0.5 tablets (25 mg total) by mouth every 12 (twelve) hours as needed for severe pain. Alba Cory, MD Taking Active            Med Note [facility] Sherlie Ban, Ruthell Rummage   Fri Sep 02, 2020  2:40 PM)    umeclidinium-vilanterol (ANORO ELLIPTA) 62.5-25 MCG/INH AEPB [id number] Yes Inhale 1 puff into the lungs daily. [name], MD Taking Active Self  Med List Note Jaymes Graff 07/22/17 1307): CPAP          Patient Active Problem List   Diagnosis Date Noted  . Hyperlipidemia associated with type 2 diabetes mellitus ([place]) 08/21/2020  . Iron deficiency 06/28/2020  . Skin lesion of chest wall 06/10/2020  . Elevated alkaline phosphatase level 06/10/2020  . Coagulation defect ([place]) 06/06/2020  . Chronic heart failure with preserved ejection fraction (HFpEF) ([place]) 01/14/2020  . Hyperlipidemia LDL goal <70 01/14/2020  . Coronary artery disease involving native coronary artery of native heart without angina pectoris 01/13/2020  . S/P cardiac catheterization 01/13/2020  . Accelerating angina ([place]) 12/31/2019  . Dysphagia 11/30/2019  . Gastritis 08/07/2019  . Lab test negative for COVID-19 virus 08/07/2019  . S/P panniculectomy 06/16/2019  . Major depressive disorder with current active episode 05/30/2019  . Exposure to COVID-19 virus 12/25/2018  . Functional diarrhea 10/22/2018  . Bilateral cataracts 06/26/2018  . Status post bariatric surgery 02/05/2018  . GAD (generalized anxiety disorder) 02/01/2018  . Rectocele 01/24/2018  . Vaginal prolapse 01/04/2018  . Hepatic steatosis 01/04/2018  . Microalbuminuria due to type 2 diabetes mellitus ([place]) 11/21/2017  . Hospital discharge follow-up 09/22/2017  . Hypocalcemia 09/22/2017  . Hypotension 09/22/2017  . Fecal incontinence 09/22/2017  . B12 deficiency 09/22/2017  . Transient disorientation 08/20/2017  . Rotator cuff arthropathy, right 08/13/2017  . Primary localized osteoarthrosis of shoulder 08/13/2017  . Encounter for preoperative examination for general surgical procedure 08/03/2017  . 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 Candidiasis, intertrigo 08/03/2017  . 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 Edema 12/13/2015  . Hypersomnia, persistent 06/18/2015  . Mechanical breakdown of implanted electronic neurostimulator of peripheral nerve ([place]) 10/16/2014  . Obesity hypoventilation syndrome ([place]) 08/13/2014  . Frequent falls 06/23/2014  . Chronic cough 05/05/2014  . Adenoma of left adrenal gland 03/24/2014  . New onset of headaches after age 81 03/23/2014  . Vitamin D deficiency 01/06/2014  . Weight gain following gastric bypass surgery 12/03/2013  . Morbid obesity ([place]) 08/25/2013  . Diaphragmatic hernia 04/06/2013  . Diabetic polyneuropathy ([place]) 03/25/2013  . Abnormality of gait 03/25/2013  . Multiple pulmonary nodules 03/20/2013  . DOE (dyspnea on exertion) 03/20/2013  . Anemia 08/06/2012  . Iron deficiency anemia 08/06/2012  . Functional disorder of bladder 07/15/2012  . Incomplete bladder emptying 07/15/2012  . 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 Urge incontinence 07/15/2012  . Increased frequency of urination 07/15/2012  . OSA on CPAP 03/02/2012  . Insomnia secondary to anxiety 02/29/2012  . Sciatica 09/05/2011  . Cervical stenosis of spinal canal 06/14/2011  . Restless legs syndrome 03/13/2011  . Degenerative disk disease 03/13/2011  . Essential hypertension 03/12/2011  . Uncontrolled type 2 diabetes with neuropathy ([place]) 03/12/2011  . Hearing loss, right 03/12/2011    Immunization History  Administered Date(s) Administered  . Fluad Quad(high Dose 65+) 03/06/2019, 06/07/2020  . Influenza Split 06/13/2011  . Influenza, High Dose Seasonal PF 08/21/2018  . Influenza,inj,Quad PF,6+ Mos 05/04/2014, 05/12/2015  . Influenza-Unspecified 04/15/2012  . Moderna Sars-Covid-2 Vaccination 02/02/2020, 03/01/2020  . Pneumococcal Conjugate-13 08/11/2013, 01/06/2014  . Pneumococcal Polysaccharide-23  01/19/2010, 09/03/2018  . Tdap 04/18/2016    Conditions to be addressed/monitored: CHF, CAD, HTN, HLD and DMII  Care Plan :  Medication Management  Updates made by De Hollingshead, RPH-CPP since 10/26/2020 12:00 AM    Problem: Diabetes, CHF     Long-Range Goal: Disease Progression Prevention   This Visit's Progress: On track  Recent Progress: On track  Priority: High  Note:   Current Barriers:  . Unable to achieve control of diabetes   Pharmacist Clinical Goal(s):  [name] Over the next 90 days, patient will achieve control of diabetes as evidenced by improvement in A1c through collaboration with PharmD and provider  Interventions: . 1:1 collaboration with [name], MD regarding development and update of comprehensive plan of care as evidenced by provider attestation and co-signature . Inter-disciplinary care team collaboration (see longitudinal plan of care) . Comprehensive medication review performed; medication list updated in electronic medical record   Diabetes: . Uncontrolled; current treatment: Farxiga 10 mg daily, Lantus 15 units daily, Novolog up to 8 units up to TID with meals  o Hx intolerance to metformin d/t diarrhea o Hx Trulicity - exacerbated diarrhea  o Hx glipizide - stopped previously when A1c was well controlled  o Hx Januvia - stopped previously when Trulicity was started  o Avoiding TZD due to HF . Patient to call [facility] to authorize continued fills for [place] 2. She notes she will do this toady. % Time CGM is active: 40% Average Glucose: 160 mg/dL 221 -> 254 -> 128 -> 134 Time in Goal:  - Time in range 70-180: 73% - Time above range: 26% - Time below range: 1% . Notes that she is getting ready to start physical therapy in the pool with her husband. Anticipate improvement in glucose w/ increased physical activity . Recommend to continue current regimen at this time.   HFpEF: . Moderately well controlled; current regimen: torsemide 20 mg  daily, potassium 20 mEq daily  . Reports that she hates the pill size of potassium and the taste . Reports weight is remaining within 1-2 lbs. She indicates she still has a fluid build up. Notes that she is on a fluid restricted diet. Also reports that she wonders if her Interstim device is resulting in less urine output. After cardiology appointment next week, she plans to reach back out to urology [facility] at [facility]  . Most recent LVEF normal 65-70% . Encouraged to continue to weigh daily, document, and report to cardiology. Encouraged to discuss concerns with fluid build up with cardiology next week. Encouraged to discuss potassium packets with him as well. . Recommended to continue current regimen.   Hyperlipidemia, secondary ASCVD prevention: . Controlled per last lab; current treatment: atorvastatin 40 mg daily  . Most recent LHC showed nonobstructive CAD w/ patent stenting.  . Current antiplatelet regimen: aspirin 81 mg daily, clopidogrel 75 mg daily - plan for DAPT for full 12 months s/p stenting (12/2020) . Recommended to continue current regimen at this time along with cardiology collaboration.  Depression/Anxiety with Insomnia: . Moderately well managed. Current treatment: citalopram 10 mg daily, bupropion 150 mg QAM, 75 mg QPM, diazepam 5 mg QPM, Lunesta 3 mg QPM . Continue to monitor for increased sedation w/ multiple CNS sedating medications. Recommended to continue current regimen at this time  Chronic Diarrhea, S/p Gastric Bypass, Esophageal "growth" . Moderately well managed, given patient history. Current regimen: dicyclomine 10 mg QID PRN, hyoscyamine 10 mg QID PRN . Notes that she needs her esophagus stretched, but is awaiting cardiology clearance to be able to hold clopidogrel. Full 12 months of DAPT recommended.  [name]  Previously recommended to continue current regimen at this time  Nocturnal "leg cramps", as well as chronic pain . More exacerbated recently per  patient report. Current regimen: pregabalin 100 mg QPM PRN;  . Recommended to continue current regimen at this time. Monitor for increased sedation with fluctuations in renal function  COPD:  . Well controlled; Current regimen: Anoro 62.5/25 mcg daily, albuterol HFA PRN, notes overall improvement in SOB recently . Recommended to continue current regimen at this time.   Patient Goals/Self-Care Activities . Over the next 90 days, patient will:  - take medications as prescribed check blood glucose at least three times daily using CGM, document, and provide at future appointments  Follow Up Plan: Face to Face appointment with care management team member scheduled for:  ~ 8 weeks      Medication Assistance: None required.  Patient affirms current coverage meets needs.  Patient's preferred pharmacy is:  [name] [id number]: [phone]: [phone] [facility] [place]- [address] [place] [address]Phone: [phone]: [phone] [facility] [place] [address]Phone: [phone]: [phone]   Follow Up:  Patient agrees to Care Plan and Follow-up.  Plan: Face to Face appointment with care management team member scheduled for: ~ 8 weeks  Catie TDarnelle Maffucci PharmD, [place] CSaxonClinical Pharmacist LOccidental [facility] [employer] [phone]

## 2020-10-26 NOTE — Patient Instructions (Signed)
Visit Information  PATIENT GOALS: Goals Addressed              This Visit's Progress     Patient Stated   .  Medication Monitoring (pt-stated)        Patient Goals/Self-Care Activities . Over the next 90 days, patient will:  - take medications as prescribed check blood glucose at least three times daily using CGM, document, and provide at future appointments       Patient verbalizes understanding of instructions provided today and agrees to view in [place].   Plan: Face to Face appointment with care management team member scheduled for: ~ 8 weeks  Catie Darnelle Maffucci, PharmD, [place], [place] Clinical Pharmacist [employer] at [employer] [phone]

## 2020-11-02 ENCOUNTER — Ambulatory Visit (INDEPENDENT_AMBULATORY_CARE_PROVIDER_SITE_OTHER): Payer: Medicare Other | Admitting: Nurse Practitioner

## 2020-11-02 ENCOUNTER — Encounter: Payer: Self-pay | Admitting: Nurse Practitioner

## 2020-11-02 ENCOUNTER — Ambulatory Visit (INDEPENDENT_AMBULATORY_CARE_PROVIDER_SITE_OTHER): Payer: Medicare Other

## 2020-11-02 ENCOUNTER — Other Ambulatory Visit: Payer: Self-pay

## 2020-11-02 VITALS — BP 110/70 | HR 77 | Ht 64.5 in | Wt 244.0 lb

## 2020-11-02 DIAGNOSIS — R002 Palpitations: Secondary | ICD-10-CM | POA: Diagnosis not present

## 2020-11-02 DIAGNOSIS — I25119 Atherosclerotic heart disease of native coronary artery with unspecified angina pectoris: Secondary | ICD-10-CM | POA: Diagnosis not present

## 2020-11-02 DIAGNOSIS — N1831 Chronic kidney disease, stage 3a: Secondary | ICD-10-CM | POA: Diagnosis not present

## 2020-11-02 DIAGNOSIS — I2510 Atherosclerotic heart disease of native coronary artery without angina pectoris: Secondary | ICD-10-CM

## 2020-11-02 DIAGNOSIS — E785 Hyperlipidemia, unspecified: Secondary | ICD-10-CM

## 2020-11-02 DIAGNOSIS — I5032 Chronic diastolic (congestive) heart failure: Secondary | ICD-10-CM | POA: Diagnosis not present

## 2020-11-02 DIAGNOSIS — I10 Essential (primary) hypertension: Secondary | ICD-10-CM

## 2020-11-02 NOTE — Progress Notes (Signed)
Office Visit    Patient Name: Denise Sharp Date of Encounter: 11/02/2020  Primary Care Provider:  [name], MD Primary Cardiologist:  [name], MD  Chief Complaint    75 year old female with a history of CAD status post PCI to the ostial and mid LAD, HFpEF, type 2 diabetes mellitus, hypertension, iron deficiency anemia status post gastric bypass x2, irritable bowel syndrome, esophageal stenosis, chronic dizziness with gait instability, cervicogenic headache with cervical spine stenosis, obstructive sleep apnea on CPAP, restless leg syndrome, and adrenal mass, presents for follow-up of heart failure.  Past Medical History    Past Medical History:  Diagnosis Date  . (HFpEF) heart failure with preserved ejection fraction ([place])    a. 12/2019 Echo: EF 65-70%, Gr2 DD. No significant valvular dzs.  . Abnormality of gait 03/25/2013  . Adrenal mass, left ([place])   . Anemia    iron deficient post  2 unit txfsn 2009, normal endo/colonoscopy by [facility]  . Arthritis   . CAD (coronary artery disease) with h/o Atypical Chest Pain    a. 04/1986 Cath ([facility]): nl cors, EF 65%; b. 07/2012 Cath [facility]): Diff minor irregs; c. 07/2016 MV [facility]): "Equivocal"; d. 08/2016 Cardiac CT Ca2+ score [facility]): Ca2+ 5638; e. 05/2019 MV: No ischemia. EF 75%; f. 12/2019 PCI: LM nl, LAD 65ost (3.5x12 Resolute DES), 49m (2.75x12 Resolute DES),LCX/RCA nl; g. 08/2020 Cath: patent LAD stents, RCA 40ost, elev RH pressures, EF >65%.  . Cervical spinal stenosis 1994   due to trauma to back ([employer] accident), has intermittent paralysis and parasthesias  . Cervicogenic headache 03/23/2014  . Depression   . Dizziness    chronic dizziness  . DJD (degenerative joint disease)    a. Chronic R shoulder pain pending R shoulder replacement 07/2017.  [name] Esophageal stenosis March 2011   with transietn outlet obstruction by food, cleared by EGD   . Family history of adverse reaction to anesthesia    daughter PONV  . Gastric bypass  status for obesity   . Headache(784.0)   . Hypertension   . IBS (irritable bowel syndrome)   . Left bundle branch block (LBBB)    a. Intermittently present - likely rate related. - patient denies  . Obesity   . Obstructive sleep apnea    using CPAP  . 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 Restless leg syndrome   . Rotator cuff arthropathy, right 08/13/2017  . Syncope and collapse 03/12/2014  . Type II diabetes mellitus ([place])    Past Surgical History:  Procedure Laterality Date  . ABDOMINAL HYSTERECTOMY    . APPENDECTOMY    . CARDIAC CATHETERIZATION    . COLONOSCOPY    . CORONARY STENT INTERVENTION N/A 01/04/2020   Procedure: CORONARY STENT INTERVENTION;  Surgeon: [name], MD;  Location: [place] [facility];  Service: Cardiovascular;  Laterality: N/A;  LAD   . DIAPHRAGMATIC HERNIA REPAIR  2015  . ESOPHAGEAL DILATION     multiple  . ESOPHAGOGASTRODUODENOSCOPY    . [place]  . GALLBLADDER SURGERY  resection  . GASTRIC BYPASS    . GASTRIC BYPASS  2000, 2005   Dr. Debroah Loop  . [facility] IMPLANT PLACEMENT  April 2013   [place]  . JOINT REPLACEMENT  2007   bilateral knee. [name],  [name]  . LEFT HEART CATH AND CORS/GRAFTS ANGIOGRAPHY N/A 12/31/2019   Procedure: LEFT HEART CATH AND CORS/GRAFTS ANGIOGRAPHY;  Surgeon: [name], MD;  Location: [place] [facility];  Service: Cardiovascular;  Laterality: N/A;  . PANNICULECTOMY  06/16/2019  . PANNICULECTOMY N/A 06/16/2019   Procedure: PANNICULECTOMY;  Surgeon: [name], MD;  Location: [place];  Service: Plastics;  Laterality: N/A;  3 hours, please  . REVERSE SHOULDER ARTHROPLASTY Right 08/13/2017   Procedure: REVERSE RIGHT SHOULDER ARTHROPLASTY;  Surgeon: [name], MD;  Location: [place];  Service: Orthopedics;  Laterality: Right;  . RIGHT/LEFT HEART CATH AND CORONARY ANGIOGRAPHY N/A 09/06/2020   Procedure: RIGHT/LEFT HEART CATH AND CORONARY ANGIOGRAPHY;  Surgeon: [name], MD;   Location: [place] [facility];  Service: Cardiovascular;  Laterality: N/A;  . ROTATOR CUFF REPAIR     right  . [place]  . TOTAL ABDOMINAL HYSTERECTOMY W/ BILATERAL SALPINGOOPHORECTOMY  1974  . UMBILICAL HERNIA REPAIR  Aug 11, 2015    Allergies  Allergies  Allergen Reactions  . Demeclocycline Hives  . Erythromycin Nausea And Vomiting and Other (See Comments)    Severe irritable bowel  . Flagyl [Metronidazole] Nausea And Vomiting and Other (See Comments)    Severe irritable bowel  . Glucophage [Metformin Hcl] Nausea And Vomiting and Other (See Comments)    "Sick" "I won't take anything that has metformin in it"  . Tetracyclines & Related Hives and Rash  . Diovan [Valsartan] Nausea Only       . Sulfa Antibiotics Rash and Other (See Comments)    As child  . Xanax [Alprazolam] Other (See Comments)    Unknown reaction    History of Present Illness    75 year old female with above complex past medical history including CAD, HFpEF, type 2 diabetes mellitus, hypertension, iron deficiency anemia, obesity status post gastric bypass x2, irritable bowel syndrome, esophageal stenosis, chronic dizziness with gait instability, cervicogenic headache with cervical spine stenosis, obstructive sleep apnea on CPAP, restless leg syndrome, and adrenal mass.  She previously underwent ischemic evaluations and diagnostic catheterizations in 1987 in 2004, both showing minor luminal irregularities.  Stress testing in November 2020 was low risk.  In the setting of worsening dyspnea, echocardiogram in June 2021 showed normal LV function with grade 2 diastolic dysfunction and without significant valvular abnormalities.  Due to complaints of chest pain during echocardiography, she underwent diagnostic catheterization which showed disease involving the ostial and mid LAD and otherwise nonobstructive disease.  The LAD was treated with 2 drug-eluting stents.  In February of this year, she continued  to complain of dyspnea and chest discomfort and underwent diagnostic catheterization revealing mild, nonobstructive CAD with a 40% stenosis in ostial RCA and widely patent ostial/proximal/mid LAD stents.  She had elevated filling pressures and diuresis was recommended, and she was placed on torsemide.  Torsemide dose has been adjusted since discharge in the setting of intermittent volume excess, along with acute kidney injury.  At her last office visit on March 16, ReDS Vest reading was improved to 31% on 20 mg of torsemide daily.  Labs are stable with a creatinine of 1.50 and potassium of 5.0.  She was referred to advanced heart failure clinic in [place].  Since her last visit, she has been feeling better.  She has been making more of an effort to walk and was able to walk into the office today (wheeled on recent previous occasions).  She has noted some improvement in dyspnea on exertion and has also been tolerating current torsemide dose of 20 mg daily.  Her weight is down 7 pounds since her last visit and she notes significant improvement  in lower extremity swelling.  She reports today that she has been experiencing tachypalpitations occurring most mornings of the week, lasting about 30 to 60 minutes, and resolving spontaneously.  She does feel short of breath when these occur.  Her husband has checked her blood pressure during episodes and he notes that her blood pressure and heart rates were normal.  She is not so sure however because she feels as though her heart is beating out of her chest.  She is willing to wear a Zio monitor to clarify.  She denies chest pain, PND, orthopnea, dizziness, syncope, or early satiety.  Home Medications    Prior to Admission medications   Medication Sig Start Date End Date Taking? Authorizing Provider  albuterol (VENTOLIN HFA) 108 (90 Base) MCG/ACT inhaler Inhale 2 puffs into the lungs every 8 (eight) hours as needed for wheezing or shortness of breath. 08/07/19    [name], MD  aspirin EC 81 MG tablet Take 1 tablet (81 mg total) by mouth daily. 01/05/20   Fritzi Mandes, MD  atorvastatin (LIPITOR) 40 MG tablet Take 1 tablet (40 mg total) by mouth daily. 03/07/20   Dunn, Areta Haber, PA-C  buPROPion (WELLBUTRIN) 75 MG tablet Take 1 tablet (75 mg total) by mouth 2 (two) times daily. 10/20/20   [name], MD  butalbital-acetaminophen-caffeine (FIORICET) [id number] MG tablet TAKE 1 TABLET BY MOUTH EVERY 6 HOURS AS NEEDED 05/25/20   [name], MD  citalopram (CELEXA) 10 MG tablet Take 1 tablet (10 mg total) by mouth daily. 09/12/20   [name], MD  clopidogrel (PLAVIX) 75 MG tablet Take 1 tablet (75 mg total) by mouth daily. 03/07/20   Dunn, Areta Haber, PA-C  clotrimazole-betamethasone (LOTRISONE) cream Apply 1 application topically 2 (two) times daily as needed.    [provider]  Continuous Blood Gluc Receiver (FREESTYLE LIBRE 2 READER) DEVI Use to check sugar at least TID 09/26/20   [name], MD  cyanocobalamin (,VITAMIN B-12,) 1000 MCG/ML injection Inject 1 mL (1,000 mcg total) into the muscle every 30 (thirty) days. Inject 1 ml (1000 mcg ) IM weekly x 4,  Then monthly thereafter 07/29/20   [name], MD  dapagliflozin propanediol (FARXIGA) 10 MG TABS tablet Take 1 tablet (10 mg total) by mouth daily before breakfast. 09/12/20   [name], MD  diazepam (VALIUM) 5 MG tablet Take 1 tablet (5 mg total) by mouth at bedtime as needed for anxiety. 05/27/20   [name], MD  diphenhydrAMINE (BENADRYL) 25 MG tablet Take 50 mg by mouth daily as needed for allergies.    [provider]  diphenoxylate-atropine (LOMOTIL) 2.5-0.025 MG tablet Take 1 tablet by mouth 4 (four) times daily as needed for diarrhea or loose stools. 10/20/20   [name], MD  Eszopiclone 3 MG TABS Take 1 tablet (3 mg total) by mouth at bedtime. 09/12/20   [name], MD  fexofenadine (ALLEGRA) 180 MG tablet Take 180 mg by mouth daily.    [provider]  hyoscyamine (ANASPAZ) 0.125 MG TBDP disintergrating tablet DISSOLVE UNDER TONGUE 15 MINUTES BEFORE MEALS 04/21/20   [name], MD  insulin aspart (NOVOLOG FLEXPEN) 100 UNIT/ML FlexPen Inject 8 units up to 3 times daily before meals 06/23/20   [name], MD  insulin glargine (LANTUS SOLOSTAR) 100 UNIT/ML Solostar Pen Inject 15 Units into the skin daily. 10/26/20   [name], MD  Insulin Pen Needle (DROPLET  PEN NEEDLES) 31G X 5 MM MISC USE ONE NEEDLE SUBCUTANEOUSLY AS DIRECTED (REMOVE AND DISCARD NEEDLE IN SHARPS CONTAINER IMMEDIATELY AFTER USE) 06/13/20   [name], MD  ipratropium (ATROVENT) 0.06 % nasal spray Place 2 sprays into both nostrils 4 (four) times daily as needed for rhinitis.    [provider]  loperamide (IMODIUM) 2 MG capsule Take by mouth as needed.    [provider]  nitroGLYCERIN (NITROSTAT) 0.4 MG SL tablet Place 1 tablet (0.4 mg total) under the tongue every 5 (five) minutes as needed for chest pain. Patient not taking: Reported on 10/26/2020 09/23/20   Theora Gianotti, NP  NONFORMULARY OR COMPOUNDED [place]  Combination Pain Cream -  Baclofen 2%, Doxepin 5%, Gabapentin 6%, Topiramate 2%, Pentoxifylline 3% Apply 1-2 grams to affected area 3-4 times daily Qty. 120 gm 3 refills 10/14/17   Gardiner Barefoot, DPM  nystatin (MYCOSTATIN/NYSTOP) powder Apply 1 g topically 4 (four) times daily as needed (rash). Patient not taking: Reported on 10/26/2020    [provider]  potassium chloride SA (KLOR-CON) 20 MEQ tablet Take 1 tablet (20 mEq total) by mouth daily. 08/19/20   End, Harrell Gave, MD  pregabalin (LYRICA) 50 MG capsule Take 1 capsule (50 mg total) by mouth 2 (two) times daily. 09/13/20   Kathrynn Ducking, MD  torsemide (DEMADEX) 20 MG tablet Take 1 tablet (20 mg total) by mouth daily. 10/03/20   End, Harrell Gave, MD  traMADol (ULTRAM) 50 MG tablet Take 0.5 tablets (25 mg total) by mouth every 12  (twelve) hours as needed for severe pain. 08/21/17   Regalado, Belkys A, MD  umeclidinium-vilanterol (ANORO ELLIPTA) 62.5-25 MCG/INH AEPB Inhale 1 puff into the lungs daily. 05/24/20   [name], MD    Review of Systems    Feeling better than on previous visits.  Resolution of lower extremity swelling and improvement in dyspnea on exertion.  No chest pain, PND, orthopnea, dizziness, syncope, or early satiety.  She has been having frequent and somewhat prolonged palpitations as outlined above.  All other systems reviewed and are otherwise negative except as noted above.  Physical Exam    VS:  BP 110/70 (BP Location: Left Arm, Patient Position: Sitting, Cuff Size: Normal)   Pulse 77   Ht 5' 4.5" (1.638 m)   Wt 244 lb (110.7 kg)   SpO2 98%   BMI 41.24 kg/m  , BMI Body mass index is 41.24 kg/m. GEN: Obese, in no acute distress. HEENT: normal. Neck: Supple, no JVD, carotid bruits, or masses. Cardiac: RRR, no murmurs, rubs, or gallops. No clubbing, cyanosis, edema.  Radials/PT 2+ and equal bilaterally.  Respiratory:  Respirations regular and unlabored, clear to auscultation bilaterally. GI: Obese, soft, nontender, nondistended, BS + x 4. MS: no deformity or atrophy. Skin: warm and dry, no rash. Neuro:  Strength and sensation are intact. Psych: Normal affect.  Accessory Clinical Findings    ECG personally reviewed by me today -regular sinus rhythm, 79, baseline artifact- no acute changes.  Lab Results  Component Value Date   WBC 9.7 08/31/2020   HGB 13.7 08/31/2020   HCT 41.2 08/31/2020   MCV 91.8 08/31/2020   PLT 277 08/31/2020   Lab Results  Component Value Date   CREATININE 1.50 (H) 09/28/2020   BUN 28 (H) 09/28/2020   NA 139 09/28/2020   K 5.0 09/28/2020   CL 104 09/28/2020   CO2 24 09/28/2020   Lab Results  Component Value Date  ALT 12 07/17/2020   AST 18 07/17/2020   ALKPHOS 129 (H) 07/17/2020   BILITOT 0.5 07/17/2020   Lab Results  Component Value Date    CHOL 134 05/24/2020   HDL 60.40 05/24/2020   LDLCALC 48 05/24/2020   LDLDIRECT 111.0 06/16/2015   TRIG 128.0 05/24/2020   CHOLHDL 2 05/24/2020    Lab Results  Component Value Date   HGBA1C 8.5 (H) 05/24/2020    Assessment & Plan    1.  Chronic heart failure with preserved ejection fraction: Patient has been tolerating torsemide 20 mg daily with relatively stable labs on March 16-creatinine 1.50 at that time.  She is down 7 pounds since her last visit and has noted improvement in activity tolerance and resolution of lower extremity swelling.  She is euvolemic on examination and ReDS Vest reading is normal at 30.  I will follow-up a basic metabolic panel today.  Continue current dose of torsemide pending labs.  Continue SGLT2 inhibitor.  Heart rate and blood pressure well controlled.  2.  Palpitations: Patient has been having tachypalpitations most mornings over the past week.  Symptoms are associated with dyspnea and last between 30 and 60 minutes.  Her husband says he has checked her blood pressure and heart rate during episodes and both have been normal though patient questions the accuracy of the heart rate findings given her symptoms.  She is willing to place a Zio for the next week.  3.  Coronary artery disease: Status post recent catheterization showing nonobstructive CAD with patent LAD stents.  She has not been having any chest pain and has noticed an improvement in baseline level of dyspnea with increased activity and volume management.  Continue dual antiplatelet therapy and statin.  4.  Essential hypertension: Stable on diuretic therapy.  5.  Deconditioning: Patient to begin water-based physical therapy next Monday.  She is looking forward to this.  6.  Hyperlipidemia: LDL 48 in November of last year.  Continue statin therapy.  7.  Stage III chronic kidney disease: Creatinine 1.50 in mid March.  Follow-up today in the setting of ongoing diuresis and weight loss.  8.  Type 2  diabetes mellitus: A1c 8.5 in November.  She remains on insulin dapagliflozin.  9.  Disposition: Placing Zio monitor today secondary to palpitations.  Basic metabolic panel today.  We will follow-up in clinic in 4 to 6 weeks.  Murray Hodgkins, NP 11/02/2020, 1:41 PM

## 2020-11-02 NOTE — Patient Instructions (Addendum)
Medication Instructions:  No changes  *If you need a refill on your cardiac medications before your next appointment, please call your pharmacy*   Lab Work: BMET today  If you have labs (blood work) drawn today and your tests are completely normal, you will receive your results only by: [name] MyChart Message (if you have MyChart) OR . A paper copy in the mail If you have any lab test that is abnormal or we need to change your treatment, we will call you to review the results.   Testing/Procedures: Your physician has recommended that you wear a Zio monitor up until you go for your pool therapy.   This monitor is a medical device that records the heart's electrical activity. Doctors most often use these monitors to diagnose arrhythmias. Arrhythmias are problems with the speed or rhythm of the heartbeat. The monitor is a small device applied to your chest. You can wear one while you do your normal daily activities. While wearing this monitor if you have any symptoms to push the button and record what you felt. Once you have worn this monitor for the period of time provider prescribed (Usually 14 days), you will return the monitor device in the postage paid box. Once it is returned they will download the data collected and provide [place] with a report which the provider will then review and we will call you with those results. Important tips:  1. Avoid showering during the first 24 hours of wearing the monitor. 2. Avoid excessive sweating to help maximize wear time. 3. Do not submerge the device, no hot tubs, and no swimming pools. 4. Keep any lotions or oils away from the patch. 5. After 24 hours you may shower with the patch on. Take brief showers with your back facing the shower head.  6. Do not remove patch once it has been placed because that will interrupt data and decrease adhesive wear time. 7. Push the button when you have any symptoms and write down what you were feeling. 8. Once you have  completed wearing your monitor, remove and place into box which has postage paid and place in your outgoing mailbox.  9. If for some reason you have misplaced your box then call our office and we can provide another box and/or mail it off for you.    Follow-Up: At [facility], you and your health needs are our priority.  As part of our continuing mission to provide you with exceptional heart care, we have created designated Provider Care Teams.  These Care Teams include your primary Cardiologist (physician) and Advanced Practice Providers (APPs -  Physician Assistants and Nurse Practitioners) who all work together to provide you with the care you need, when you need it.   Your next appointment:   4-6 week(s)  The format for your next appointment:   In Person  Provider:   [name], MD or Denise Hodgkins, NP  Heart failure clinic in [place] is [phone] If you do not hear from them in a week give them a call.

## 2020-11-03 ENCOUNTER — Telehealth: Payer: Self-pay | Admitting: *Deleted

## 2020-11-03 LAB — BASIC METABOLIC PANEL
BUN/Creatinine Ratio: 15 (ref 12–28)
BUN: 22 mg/dL (ref 8–27)
CO2: 21 mmol/L (ref 20–29)
Calcium: 9.8 mg/dL (ref 8.7–10.3)
Chloride: 98 mmol/L (ref 96–106)
Creatinine, Ser: 1.48 mg/dL — ABNORMAL HIGH (ref 0.57–1.00)
Glucose: 240 mg/dL — ABNORMAL HIGH (ref 65–99)
Potassium: 3.9 mmol/L (ref 3.5–5.2)
Sodium: 142 mmol/L (ref 134–144)
eGFR: 37 mL/min/{1.73_m2} — ABNORMAL LOW (ref >59)

## 2020-11-03 NOTE — Telephone Encounter (Signed)
Reviewed results with patient and she inquired if it was safe to get her shingles vaccine. Discussed with APP here in office and he advised that she should get vaccine. She verbalized understanding of results, recommendations to get vaccine, and she had no further questions at this time.

## 2020-11-03 NOTE — Telephone Encounter (Signed)
Left voicemail message for patient to call back for review of results  

## 2020-11-03 NOTE — Telephone Encounter (Signed)
-----   Message from Theora Gianotti, NP sent at 11/03/2020  7:26 AM EDT ----- Kidney function and electrolytes are stable on current dose of torsemide.

## 2020-11-04 ENCOUNTER — Ambulatory Visit: Payer: Medicare Other | Admitting: *Deleted

## 2020-11-04 DIAGNOSIS — E1140 Type 2 diabetes mellitus with diabetic neuropathy, unspecified: Secondary | ICD-10-CM | POA: Diagnosis not present

## 2020-11-04 DIAGNOSIS — I2510 Atherosclerotic heart disease of native coronary artery without angina pectoris: Secondary | ICD-10-CM | POA: Diagnosis not present

## 2020-11-04 DIAGNOSIS — E1165 Type 2 diabetes mellitus with hyperglycemia: Secondary | ICD-10-CM | POA: Diagnosis not present

## 2020-11-04 DIAGNOSIS — I10 Essential (primary) hypertension: Secondary | ICD-10-CM | POA: Diagnosis not present

## 2020-11-04 DIAGNOSIS — I5033 Acute on chronic diastolic (congestive) heart failure: Secondary | ICD-10-CM | POA: Diagnosis not present

## 2020-11-04 DIAGNOSIS — IMO0002 Reserved for concepts with insufficient information to code with codable children: Secondary | ICD-10-CM

## 2020-11-04 DIAGNOSIS — E1142 Type 2 diabetes mellitus with diabetic polyneuropathy: Secondary | ICD-10-CM | POA: Diagnosis not present

## 2020-11-04 NOTE — Patient Instructions (Signed)
Visit Information  PATIENT GOALS: Goals Addressed            This Visit's Progress   . (RNCM) Monitor and Manage My Blood Sugar-Diabetes Type 2   On track    Timeframe:  Long-Range Goal Priority:  High Start Date:     10/19/20                      Expected End Date:   04/14/21                    Follow Up Date 11/25/20    . Check blood sugar at least 3 times a day . Check blood sugar if I feel it is too high or too low . Enter blood sugar readings and medication/insulin into daily log for provider review . Schedule yearly diabetic eye exam . Contact CCM Pharmacist or PCP if blood sugars sustained over 250 consistently . Try to avoid drinking sweet tea with regular sugar   Why is this important?    Checking your blood sugar at home helps to keep it from getting very high or very low.   Writing the results in a diary or log helps the doctor know how to care for you.   Your blood sugar log should have the time, date and the results.   Also, write down the amount of insulin or other medicine that you take.   Other information, like what you ate, exercise done and how you were feeling, will also be helpful.     Notes:     [name] (RNCM) Track and Manage Fluids and Swelling-Heart Failure   On track    Timeframe:  Long-Range Goal Priority:  Medium Start Date: 10/19/20                            Expected End Date:    04/14/21                   Follow Up Date 11/25/20    . Call office if I gain more than 2 pounds in one day or 5 pounds in one week . Watch for swelling in feet, ankles and legs every day . Weigh myself daily, first thing in morning after emptying bladder . Record weighs in log for provider review . Eat fresh or frozen vegetables, avoid canned and processed foods . Increase activity as tolerated . Take heart monitor off prior to water exercises   Why is this important?    It is important to check your weight daily and watch how much salt and liquids you have.   It  will help you to manage your heart failure.    Notes:        Patient verbalizes understanding of instructions provided today and agrees to view in [place].   The care management team will reach out to the patient again over the next 30 business days.   Hubert Azure RN, MSN RN Care Management Coordinator [place] [phone] [email or web address]

## 2020-11-04 NOTE — Chronic Care Management (AMB) (Signed)
Chronic Care Management   CCM RN Visit Note  11/04/2020 Name: Denise Sharp MRN: [id number] DOB: [birth year]  Subjective: Denise Sharp is a 75 y.o. year old female who is a primary care patient of [name], Aris Everts, MD. The care management team was consulted for assistance with disease management and care coordination needs.    Engaged with patient by telephone for follow up visit in response to provider referral for case management and/or care coordination services.   Consent to Services:  The patient was given information about Chronic Care Management services, agreed to services, and gave verbal consent prior to initiation of services.  Please see initial visit note for detailed documentation.   Patient agreed to services and verbal consent obtained.   Assessment: Review of patient past medical history, allergies, medications, health status, including review of consultants reports, laboratory and other test data, was performed as part of comprehensive evaluation and provision of chronic care management services.   SDOH (Social Determinants of Health) assessments and interventions performed:    CCM Care Plan  Allergies  Allergen Reactions  . Demeclocycline Hives  . Erythromycin Nausea And Vomiting and Other (See Comments)    Severe irritable bowel  . Flagyl [Metronidazole] Nausea And Vomiting and Other (See Comments)    Severe irritable bowel  . Glucophage [Metformin Hcl] Nausea And Vomiting and Other (See Comments)    "Sick" "I won't take anything that has metformin in it"  . Tetracyclines & Related Hives and Rash  . Diovan [Valsartan] Nausea Only       . Sulfa Antibiotics Rash and Other (See Comments)    As child  . Xanax [Alprazolam] Other (See Comments)    Unknown reaction    Outpatient Encounter Medications as of 11/04/2020  Medication Sig  . insulin aspart (NOVOLOG FLEXPEN) 100 UNIT/ML FlexPen Inject 8 units up to 3 times daily before meals  . insulin glargine  (LANTUS SOLOSTAR) 100 UNIT/ML Solostar Pen Inject 15 Units into the skin daily.  [name] torsemide (DEMADEX) 20 MG tablet Take 1 tablet (20 mg total) by mouth daily.  [name] umeclidinium-vilanterol (ANORO ELLIPTA) 62.5-25 MCG/INH AEPB Inhale 1 puff into the lungs daily.  [name] albuterol (VENTOLIN HFA) 108 (90 Base) MCG/ACT inhaler Inhale 2 puffs into the lungs every 8 (eight) hours as needed for wheezing or shortness of breath.  [name] aspirin EC 81 MG tablet Take 1 tablet (81 mg total) by mouth daily.  [name] atorvastatin (LIPITOR) 40 MG tablet Take 1 tablet (40 mg total) by mouth daily.  [name] buPROPion (WELLBUTRIN) 75 MG tablet Take 1 tablet (75 mg total) by mouth 2 (two) times daily.  . butalbital-acetaminophen-caffeine (FIORICET) 50-325-40 MG tablet TAKE 1 TABLET BY MOUTH EVERY 6 HOURS AS NEEDED  . citalopram (CELEXA) 10 MG tablet Take 1 tablet (10 mg total) by mouth daily.  . clopidogrel (PLAVIX) 75 MG tablet Take 1 tablet (75 mg total) by mouth daily.  . clotrimazole-betamethasone (LOTRISONE) cream Apply 1 application topically 2 (two) times daily as needed.  . Continuous Blood Gluc Receiver (FREESTYLE LIBRE 2 READER) DEVI Use to check sugar at least TID  . cyanocobalamin (,VITAMIN B-12,) 1000 MCG/ML injection Inject 1 mL (1,000 mcg total) into the muscle every 30 (thirty) days. Inject 1 ml (1000 mcg ) IM weekly x 4,  Then monthly thereafter  . dapagliflozin propanediol (FARXIGA) 10 MG TABS tablet Take 1 tablet (10 mg total) by mouth daily before breakfast.  . diazepam (VALIUM) 5 MG tablet Take 1 tablet (  5 mg total) by mouth at bedtime as needed for anxiety.  . diphenhydrAMINE (BENADRYL) 25 MG tablet Take 50 mg by mouth daily as needed for allergies.  . diphenoxylate-atropine (LOMOTIL) 2.5-0.025 MG tablet Take 1 tablet by mouth 4 (four) times daily as needed for diarrhea or loose stools.  . Eszopiclone 3 MG TABS Take 1 tablet (3 mg total) by mouth at bedtime.  . fexofenadine (ALLEGRA) 180 MG tablet Take 180 mg by mouth  daily.  . hyoscyamine (ANASPAZ) 0.125 MG TBDP disintergrating tablet DISSOLVE UNDER TONGUE 15 MINUTES BEFORE MEALS  . Insulin Pen Needle (DROPLET PEN NEEDLES) 31G X 5 MM MISC USE ONE NEEDLE SUBCUTANEOUSLY AS DIRECTED (REMOVE AND DISCARD NEEDLE IN SHARPS CONTAINER IMMEDIATELY AFTER USE)  . ipratropium (ATROVENT) 0.06 % nasal spray Place 2 sprays into both nostrils 4 (four) times daily as needed for rhinitis.  [name] loperamide (IMODIUM) 2 MG capsule Take by mouth as needed.  . nitroGLYCERIN (NITROSTAT) 0.4 MG SL tablet Place 1 tablet (0.4 mg total) under the tongue every 5 (five) minutes as needed for chest pain.  . NONFORMULARY OR COMPOUNDED [place]  Combination Pain Cream -  Baclofen 2%, Doxepin 5%, Gabapentin 6%, Topiramate 2%, Pentoxifylline 3% Apply 1-2 grams to affected area 3-4 times daily Qty. 120 gm 3 refills  . nystatin (MYCOSTATIN/NYSTOP) powder Apply 1 g topically 4 (four) times daily as needed (rash).  . potassium chloride SA (KLOR-CON) 20 MEQ tablet Take 1 tablet (20 mEq total) by mouth daily.  . pregabalin (LYRICA) 50 MG capsule Take 1 capsule (50 mg total) by mouth 2 (two) times daily.  . traMADol (ULTRAM) 50 MG tablet Take 0.5 tablets (25 mg total) by mouth every 12 (twelve) hours as needed for severe pain.   No facility-administered encounter medications on file as of 11/04/2020.    Patient Active Problem List   Diagnosis Date Noted  . Hyperlipidemia associated with type 2 diabetes mellitus ([place]) 08/21/2020  . Iron deficiency 06/28/2020  . Skin lesion of chest wall 06/10/2020  . Elevated alkaline phosphatase level 06/10/2020  . Coagulation defect ([place]) 06/06/2020  . Chronic heart failure with preserved ejection fraction (HFpEF) ([place]) 01/14/2020  . Hyperlipidemia LDL goal <70 01/14/2020  . Coronary artery disease involving native coronary artery of native heart without angina pectoris 01/13/2020  . S/P cardiac catheterization 01/13/2020  . Accelerating angina  ([place]) 12/31/2019  . Dysphagia 11/30/2019  . Gastritis 08/07/2019  . Lab test negative for COVID-19 virus 08/07/2019  . S/P panniculectomy 06/16/2019  . Major depressive disorder with current active episode 05/30/2019  . Exposure to COVID-19 virus 12/25/2018  . Functional diarrhea 10/22/2018  . Bilateral cataracts 06/26/2018  . Status post bariatric surgery 02/05/2018  . GAD (generalized anxiety disorder) 02/01/2018  . Rectocele 01/24/2018  . Vaginal prolapse 01/04/2018  . Hepatic steatosis 01/04/2018  . Microalbuminuria due to type 2 diabetes mellitus ([place]) 11/21/2017  . Hospital discharge follow-up 09/22/2017  . Hypocalcemia 09/22/2017  . Hypotension 09/22/2017  . Fecal incontinence 09/22/2017  . B12 deficiency 09/22/2017  . Transient disorientation 08/20/2017  . Rotator cuff arthropathy, right 08/13/2017  . Primary localized osteoarthrosis of shoulder 08/13/2017  . Encounter for preoperative examination for general surgical procedure 08/03/2017  . 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 Candidiasis, intertrigo 08/03/2017  . 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 Edema 12/13/2015  . Hypersomnia, persistent 06/18/2015  . Mechanical breakdown of implanted electronic neurostimulator of peripheral nerve ([place])  10/16/2014  . Obesity hypoventilation syndrome ([place]) 08/13/2014  . Frequent falls 06/23/2014  . Chronic cough 05/05/2014  . Adenoma of left adrenal gland 03/24/2014  . New onset of headaches after age 59 03/23/2014  . Vitamin D deficiency 01/06/2014  . Weight gain following gastric bypass surgery 12/03/2013  . Morbid obesity ([place]) 08/25/2013  . Diaphragmatic hernia 04/06/2013  . Diabetic polyneuropathy ([place]) 03/25/2013  . Abnormality of gait 03/25/2013  . Multiple pulmonary nodules 03/20/2013  . DOE (dyspnea on exertion) 03/20/2013  . Anemia 08/06/2012  . Iron deficiency anemia 08/06/2012  . Functional  disorder of bladder 07/15/2012  . Incomplete bladder emptying 07/15/2012  . 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 Urge incontinence 07/15/2012  . Increased frequency of urination 07/15/2012  . OSA on CPAP 03/02/2012  . Insomnia secondary to anxiety 02/29/2012  . Sciatica 09/05/2011  . Cervical stenosis of spinal canal 06/14/2011  . Restless legs syndrome 03/13/2011  . Degenerative disk disease 03/13/2011  . Essential hypertension 03/12/2011  . Uncontrolled type 2 diabetes with neuropathy ([place]) 03/12/2011  . Hearing loss, right 03/12/2011    Conditions to be addressed/monitored:CHF and DMII  Care Plan : Heart Failure (Adult)  Updates made by Leona Singleton, RN since 11/04/2020 12:00 AM  Problem: Symptom Exacerbation (Heart Failure)   Priority: Medium  Long-Range Goal: Symptom Exacerbation Prevented or Minimized   Start Date: 10/19/2020  Expected End Date: 04/14/2021  This Visit's Progress: On track  Priority: Medium  Current Barriers:  [name] Knowledge deficit related to basic heart failure pathophysiology and self care management patient reporting she continues to feel better.  Less shortness of breath with activity.  Weight this morning was 240.6 pounds (239-243 typical range).  Denies any swelling in ankles or abdomen.  Does report feel swell daily, but resolve when sleeping at night.  Patient stating she now gets up early, instead of sleeping in.  Plans to start doing water exercises this coming Monday.  Does report she has been having palpitations when awakening in the mornings.  Has seen Cardiology and is now wearing cardiac monitor.  Reports she has not had any episodes since monitor has been on. Case Manager Clinical Goal(s):  . patient will weigh self daily and record . patient will verbalize understanding of Heart Failure Action Plan and when to call doctor . patient will take all Heart Failure mediations as prescribed . patient will weigh daily and record (notifying MD  of 3 lb weight gain over night or 5 lb in a week) Interventions:  . Collaboration with [name], MD regarding development and update of comprehensive plan of care as evidenced by provider attestation and co-signature . Inter-disciplinary care team collaboration (see longitudinal plan of care) . Basic overview and discussion of pathophysiology of Heart Failure reviewed  . Provided verbal education on low sodium diet . Provided education about placing scale on hard, flat surface . Advised patient to weigh each morning after emptying bladder . Discussed importance of daily weight and advised patient to weigh and record daily . Reviewed role of diuretics in prevention of fluid overload and management of heart failure; encouraged compliance with current dosing of Demadex . Barriers to lifestyle changes reviewed and addressed . Barriers to treatment reviewed and addressed . Medication-adherence assessment completed . Rescue (action) plan reviewed . Discussed palpitations and encouraged patient to contact Cardiology if they resume when she starts water exercises . Self-awareness of signs/symptoms of worsening disease encouraged . Activity or exercise based on tolerance encouraged  and encouraged to pace activity allowing for rest . Reviewed foods high in sodium, discussed eating fresh fruits and vegetables and avoiding canned and processed foods, encouraged to continue to read food labels Patient Goals/Self-Care Activities: . Call office if I gain more than 2 pounds in one day or 5 pounds in one week . Watch for swelling in feet, ankles and legs every day . Weigh myself daily, first thing in morning after emptying bladder . Record weighs in log for provider review . Eat fresh or frozen vegetables, avoid canned and processed foods . Increase activity as tolerated . Take heart monitor off prior to water exercises Follow Up Plan: The care management team will reach out to the patient again over  the next 30 business days.     Care Plan : Diabetes Type 2 (Adult)  Updates made by Leona Singleton, RN since 11/04/2020 12:00 AM  Problem: Glycemic Management (Diabetes, Type 2)   Priority: High  Long-Range Goal: Patient will report no sustained fasting blood sugars greater than 200 within the next 60 days   Start Date: 10/19/2020  Expected End Date: 04/14/2021  This Visit's Progress: Not on track  Priority: High  Objective:  Lab Results  Component Value Date   HGBA1C 8.5 (H) 05/24/2020  Current Barriers:  [name] Knowledge Deficits related to basic Diabetes pathophysiology and self care/management as evidenced by patient blood sugar ranges of 160-180's fasting and ranges of 200's in afternoons.  Fasting blood sugar this morning was 169.  Reports compliance with her medications.  Denies any hypoglycemic episodes and is looking forward to increasing activity level. Case Manager Clinical Goal(s):  . patient will demonstrate improved adherence to prescribed treatment plan for diabetes self care/management as evidenced by: 3 times a day monitoring and recording of CBG,  adherence to ADA/ carb modified diet, adherence to prescribed medication regimen, contacting provider for new or worsened symptoms or questions Interventions:  . Collaboration with [name], MD regarding development and update of comprehensive plan of care as evidenced by provider attestation and co-signature . Inter-disciplinary care team collaboration (see longitudinal plan of care) . Provided education to patient about basic DM disease process . Reviewed medications with patient and discussed importance of medication adherence . Discussed plans with patient for ongoing care management follow up and provided patient with direct contact information for care management team . Provided patient with written educational materials related to hypo and hyperglycemia and importance of correct treatment . Advised patient, providing  education and rationale, to check cbg 3 times a day and record, calling CCM Pharmacist or PCP for findings outside established parameters.   . Barriers to adherence to treatment plan identified . Blood glucose readings reviewed . Resources required to improve adherence to care identified . Self-awareness of signs/symptoms of hypo or hyperglycemia encouraged . Use of blood glucose monitoring log promoted . Discussed minimizing drinking sweet tea and reviewed healthier low carbohydrate food and drink options . Confirmed patient has not had yearly eye exam and encouraged patient to schedule as soon as possible . Encouraged to continue to work with CCM Pharmacist; encouraged patient to contact CCM Pharmacist for sustained elevations . Encouraged to attend all scheduled provider appointments  . Discussed and encouraged to adhere to prescribed ADA/carb modified  Assessed barriers to management plan, such as food insecurity, age, developmental ability, depression, anxiety, fear of hypoglycemia or weight gain, as well as medication cost, side effects and complicated regimen.   Discussed continuous blood sugar monitoring  system and need to scan multiple times a day to check blood sugars Patient Goals/Self-Care Activities: . Check blood sugar at least 3 times a day . Check blood sugar if I feel it is too high or too low . Enter blood sugar readings and medication/insulin into daily log for provider review . Schedule yearly diabetic eye exam . Contact CCM Pharmacist or PCP if blood sugars sustained over 250 consistently . Try to avoid drinking sweet tea with regular sugar Follow Up Plan: The care management team will reach out to the patient again over the next 30 business days.       Plan:The care management team will reach out to the patient again over the next 30 business days.  Hubert Azure RN, MSN RN Care Management Coordinator [place] [phone] [email or web address]

## 2020-11-07 DIAGNOSIS — R2681 Unsteadiness on feet: Secondary | ICD-10-CM | POA: Diagnosis not present

## 2020-11-07 DIAGNOSIS — R2689 Other abnormalities of gait and mobility: Secondary | ICD-10-CM | POA: Diagnosis not present

## 2020-11-07 DIAGNOSIS — M6281 Muscle weakness (generalized): Secondary | ICD-10-CM | POA: Diagnosis not present

## 2020-11-07 DIAGNOSIS — R002 Palpitations: Secondary | ICD-10-CM | POA: Diagnosis not present

## 2020-11-09 DIAGNOSIS — M6281 Muscle weakness (generalized): Secondary | ICD-10-CM | POA: Diagnosis not present

## 2020-11-09 DIAGNOSIS — R2689 Other abnormalities of gait and mobility: Secondary | ICD-10-CM | POA: Diagnosis not present

## 2020-11-09 DIAGNOSIS — R2681 Unsteadiness on feet: Secondary | ICD-10-CM | POA: Diagnosis not present

## 2020-11-10 ENCOUNTER — Telehealth: Payer: Self-pay | Admitting: Internal Medicine

## 2020-11-10 NOTE — Telephone Encounter (Signed)
lft pt vm to call ofc to follow up on PT referral. thanks

## 2020-11-10 NOTE — Telephone Encounter (Signed)
Patient was returning call about referral 

## 2020-11-14 DIAGNOSIS — R002 Palpitations: Secondary | ICD-10-CM | POA: Diagnosis not present

## 2020-11-14 DIAGNOSIS — R2689 Other abnormalities of gait and mobility: Secondary | ICD-10-CM | POA: Diagnosis not present

## 2020-11-14 DIAGNOSIS — M6281 Muscle weakness (generalized): Secondary | ICD-10-CM | POA: Diagnosis not present

## 2020-11-14 DIAGNOSIS — R2681 Unsteadiness on feet: Secondary | ICD-10-CM | POA: Diagnosis not present

## 2020-11-15 ENCOUNTER — Encounter: Payer: Self-pay | Admitting: Internal Medicine

## 2020-11-15 ENCOUNTER — Ambulatory Visit (INDEPENDENT_AMBULATORY_CARE_PROVIDER_SITE_OTHER): Payer: Medicare Other | Admitting: Internal Medicine

## 2020-11-15 ENCOUNTER — Other Ambulatory Visit: Payer: Self-pay

## 2020-11-15 ENCOUNTER — Ambulatory Visit (INDEPENDENT_AMBULATORY_CARE_PROVIDER_SITE_OTHER): Payer: Medicare Other | Admitting: Pharmacist

## 2020-11-15 VITALS — BP 114/58 | HR 93 | Temp 96.6°F | Resp 15 | Ht 64.5 in | Wt 251.4 lb

## 2020-11-15 DIAGNOSIS — R3989 Other symptoms and signs involving the genitourinary system: Secondary | ICD-10-CM

## 2020-11-15 DIAGNOSIS — N1832 Chronic kidney disease, stage 3b: Secondary | ICD-10-CM

## 2020-11-15 DIAGNOSIS — M316 Other giant cell arteritis: Secondary | ICD-10-CM

## 2020-11-15 DIAGNOSIS — R300 Dysuria: Secondary | ICD-10-CM

## 2020-11-15 DIAGNOSIS — I2609 Other pulmonary embolism with acute cor pulmonale: Secondary | ICD-10-CM

## 2020-11-15 DIAGNOSIS — F330 Major depressive disorder, recurrent, mild: Secondary | ICD-10-CM | POA: Diagnosis not present

## 2020-11-15 DIAGNOSIS — F419 Anxiety disorder, unspecified: Secondary | ICD-10-CM

## 2020-11-15 DIAGNOSIS — E1140 Type 2 diabetes mellitus with diabetic neuropathy, unspecified: Secondary | ICD-10-CM

## 2020-11-15 DIAGNOSIS — I10 Essential (primary) hypertension: Secondary | ICD-10-CM

## 2020-11-15 DIAGNOSIS — IMO0002 Reserved for concepts with insufficient information to code with codable children: Secondary | ICD-10-CM

## 2020-11-15 DIAGNOSIS — I4729 Other ventricular tachycardia: Secondary | ICD-10-CM

## 2020-11-15 DIAGNOSIS — D689 Coagulation defect, unspecified: Secondary | ICD-10-CM

## 2020-11-15 DIAGNOSIS — L97529 Non-pressure chronic ulcer of other part of left foot with unspecified severity: Secondary | ICD-10-CM

## 2020-11-15 DIAGNOSIS — I5032 Chronic diastolic (congestive) heart failure: Secondary | ICD-10-CM

## 2020-11-15 DIAGNOSIS — R269 Unspecified abnormalities of gait and mobility: Secondary | ICD-10-CM

## 2020-11-15 DIAGNOSIS — E1165 Type 2 diabetes mellitus with hyperglycemia: Secondary | ICD-10-CM

## 2020-11-15 DIAGNOSIS — I2510 Atherosclerotic heart disease of native coronary artery without angina pectoris: Secondary | ICD-10-CM

## 2020-11-15 DIAGNOSIS — E279 Disorder of adrenal gland, unspecified: Secondary | ICD-10-CM | POA: Diagnosis not present

## 2020-11-15 DIAGNOSIS — E11621 Type 2 diabetes mellitus with foot ulcer: Secondary | ICD-10-CM | POA: Diagnosis not present

## 2020-11-15 DIAGNOSIS — E1142 Type 2 diabetes mellitus with diabetic polyneuropathy: Secondary | ICD-10-CM

## 2020-11-15 DIAGNOSIS — F5105 Insomnia due to other mental disorder: Secondary | ICD-10-CM

## 2020-11-15 DIAGNOSIS — I25119 Atherosclerotic heart disease of native coronary artery with unspecified angina pectoris: Secondary | ICD-10-CM | POA: Diagnosis not present

## 2020-11-15 DIAGNOSIS — I5033 Acute on chronic diastolic (congestive) heart failure: Secondary | ICD-10-CM

## 2020-11-15 HISTORY — DX: Other ventricular tachycardia: I47.29

## 2020-11-15 LAB — POCT GLYCOSYLATED HEMOGLOBIN (HGB A1C): Hemoglobin A1C: 8.2 % — AB (ref 4.0–5.6)

## 2020-11-15 MED ORDER — METOLAZONE 2.5 MG PO TABS: 2.5000 mg | ORAL_TABLET | Freq: Every day | ORAL | 2 refills | Status: DC

## 2020-11-15 NOTE — Patient Instructions (Addendum)
Sometimes fluid pills do not get absorbed well if the stomach wall is swollen with fluid, OS THEY CAN'T DO THEIR JOB.   I am prescribing zaroxolyn ,  Which will help the torsemide work if you take it 30 minutes BEFORE the torsemide dose each day   Return in one week to have your kidney function checked   Take your Lantus dose in the evening instead of morning STARTING TONIGHT

## 2020-11-15 NOTE — Chronic Care Management (AMB) (Signed)
Chronic Care Management Pharmacy Note  11/15/2020 Name:  Denise Sharp MRN:  [id number] DOB:  [birth year]  Subjective: Denise Sharp is an 75 y.o. year old female who is a primary patient of [name], Aris Everts, MD.  The CCM team was consulted for assistance with disease management and care coordination needs.    Engaged with patient face to face for follow up visit in response to provider referral for pharmacy case management and/or care coordination services.   Consent to Services:  The patient was given information about Chronic Care Management services, agreed to services, and gave verbal consent prior to initiation of services.  Please see initial visit note for detailed documentation.   Patient Care Team: [name], MD as PCP - General (Internal Medicine) End, Harrell Gave, MD as PCP - Cardiology (Cardiology) Chiropractic, Freddi Che as Referring Physician De Hollingshead, RPH-CPP (Pharmacist) Leona Singleton, RN as Case Manager  Recent office visits:  4/22 - RN CM f/u  Recent consult visits:  4/20- cardiology Sharolyn Douglas - recommended Zio patch, continue other medications  Hospital visits:  2/22 - heart catheterization   Objective:  Lab Results  Component Value Date   CREATININE 1.48 (H) 11/02/2020   CREATININE 1.50 (H) 09/28/2020   CREATININE 1.47 (H) 09/20/2020    Lab Results  Component Value Date   HGBA1C 8.2 (A) 11/15/2020   Last diabetic Eye exam:  Lab Results  Component Value Date/Time   HMDIABEYEEXA No Retinopathy 10/15/2016 12:00 AM    Last diabetic Foot exam:  Lab Results  Component Value Date/Time   HMDIABFOOTEX abnormal 06/23/2014 12:00 AM        Component Value Date/Time   CHOL 134 05/24/2020 1532   CHOL 155 02/27/2014 0420   TRIG 128.0 05/24/2020 1532   TRIG 121 02/27/2014 0420   HDL 60.40 05/24/2020 1532   HDL 43 02/27/2014 0420   CHOLHDL 2 05/24/2020 1532   VLDL 25.6 05/24/2020 1532   VLDL 24 02/27/2014 0420   LDLCALC  48 05/24/2020 1532   LDLCALC 88 02/27/2014 0420   LDLDIRECT 111.0 06/16/2015 0936    Hepatic Function Latest Ref Rng & Units 07/17/2020 06/07/2020 06/07/2020  Total Protein 6.5 - 8.1 g/dL 7.6 - 6.9  Albumin 3.5 - 5.0 g/dL 3.5 - 3.6  AST 15 - 41 U/L 18 - 12  ALT 0 - 44 U/L 12 - 9  Alk Phosphatase 38 - 126 U/L 129(H) 153(H) 134(H)  Total Bilirubin 0.3 - 1.2 mg/dL 0.5 - 0.3  Bilirubin, Direct 0.0 - 0.3 mg/dL - - 0.1    Lab Results  Component Value Date/Time   TSH 3.695 07/17/2020 11:50 AM   TSH 3.50 01/01/2019 01:45 PM   TSH 2.279 08/20/2017 05:01 AM   TSH 4.400 06/16/2015 09:36 AM   FREET4 1.10 05/03/2015 04:20 PM    CBC Latest Ref Rng & Units 08/31/2020 07/17/2020 06/07/2020  WBC 4.0 - 10.5 K/uL 9.7 10.5 11.1(H)  Hemoglobin 12.0 - 15.0 g/dL 13.7 11.4(L) 10.1(L)  Hematocrit 36.0 - 46.0 % 41.2 38.6 32.8(L)  Platelets 150 - 400 K/uL 277 354 420.0(H)    Lab Results  Component Value Date/Time   VD25OH 21.65 (L) 06/04/2017 02:12 PM   VD25OH 23.44 (L) 11/19/2016 02:31 PM    Clinical ASCVD: Yes  The 10-year ASCVD risk score Mikey Bussing DC Jr., et al., 2013) is: 40.2%   Values used to calculate the score:     Age: 13 years     Sex: Female  Is Non-Hispanic African American: No     Diabetic: Yes     Tobacco smoker: Yes     Systolic Blood Pressure: 409 mmHg     Is BP treated: Yes     HDL Cholesterol: 60.4 mg/dL     Total Cholesterol: 134 mg/dL     Social History   Tobacco Use  Smoking Status Never Smoker  Smokeless Tobacco Never Used   BP Readings from Last 3 Encounters:  11/15/20 (!) 114/58  11/02/20 110/70  09/28/20 100/60   Pulse Readings from Last 3 Encounters:  11/15/20 93  11/02/20 77  09/28/20 73   Wt Readings from Last 3 Encounters:  11/15/20 251 lb 6.4 oz (114 kg)  11/02/20 244 lb (110.7 kg)  09/28/20 251 lb 4 oz (114 kg)    Assessment: Review of patient past medical history, allergies, medications, health status, including review of consultants reports,  laboratory and other test data, was performed as part of comprehensive evaluation and provision of chronic care management services.   SDOH:  (Social Determinants of Health) assessments and interventions performed:  SDOH Interventions   Flowsheet Row Most Recent Value  SDOH Interventions   Financial Strain Interventions Intervention Not Indicated  Physical Activity Interventions Other (Comments)  [pool PT]      CCM Care Plan  Allergies  Allergen Reactions  . Demeclocycline Hives  . Erythromycin Nausea And Vomiting and Other (See Comments)    Severe irritable bowel  . Flagyl [Metronidazole] Nausea And Vomiting and Other (See Comments)    Severe irritable bowel  . Glucophage [Metformin Hcl] Nausea And Vomiting and Other (See Comments)    "Sick" "I won't take anything that has metformin in it"  . Tetracyclines & Related Hives and Rash  . Diovan [Valsartan] Nausea Only       . Sulfa Antibiotics Rash and Other (See Comments)    As child  . Xanax [Alprazolam] Other (See Comments)    Unknown reaction    Medications Reviewed Today    Reviewed by Adair Laundry, [place] (Certified Medical Assistant) on 11/15/20 at [place] List Status: <None>  Medication Order Taking? Sig Documenting Provider Last Dose Status Informant  albuterol (VENTOLIN HFA) 108 (90 Base) MCG/ACT inhaler [id number] Yes Inhale 2 puffs into the lungs every 8 (eight) hours as needed for wheezing or shortness of breath. [name], MD Taking Active Self  aspirin EC 81 MG tablet [id number] Yes Take 1 tablet (81 mg total) by mouth daily. Fritzi Mandes, MD Taking Active Self  atorvastatin (LIPITOR) 40 MG tablet [id number] Yes Take 1 tablet (40 mg total) by mouth daily. Rise Mu, PA-C Taking Active Self  buPROPion [facility]) 75 MG tablet [id number] Yes Take 1 tablet (75 mg total) by mouth 2 (two) times daily. [name], MD Taking Active   butalbital-acetaminophen-caffeine (FIORICET) [phone] MG tablet [id number]  Yes TAKE 1 TABLET BY MOUTH EVERY 6 HOURS AS NEEDED [name], MD Taking Active Self  citalopram (CELEXA) 10 MG tablet [id number] Yes Take 1 tablet (10 mg total) by mouth daily. [name], MD Taking Active   clopidogrel (PLAVIX) 75 MG tablet [id number] Yes Take 1 tablet (75 mg total) by mouth daily. Rise Mu, PA-C Taking Active Self  clotrimazole-betamethasone Donalynn Furlong) cream [id number] Yes Apply 1 application topically 2 (two) times daily as needed. [provider] Taking Active Self  Continuous Blood Gluc Receiver (FREESTYLE LIBRE 2 READER) DEVI [id number] Yes Use to check sugar at least TID Tullo,  Aris Everts, MD Taking Active   cyanocobalamin (,VITAMIN B-12,) 1000 MCG/ML injection [id number] Yes Inject 1 mL (1,000 mcg total) into the muscle every 30 (thirty) days. Inject 1 ml (1000 mcg ) IM weekly x 4,  Then monthly thereafter [name], MD Taking Active Self  dapagliflozin propanediol (FARXIGA) 10 MG TABS tablet [id number] Yes Take 1 tablet (10 mg total) by mouth daily before breakfast. [name], MD Taking Active   diazepam (VALIUM) 5 MG tablet [id number] Yes Take 1 tablet (5 mg total) by mouth at bedtime as needed for anxiety.  Patient taking differently: Take 5 mg by mouth at bedtime.   [name], MD Taking Active Self  diphenhydrAMINE (BENADRYL) 25 MG tablet [id number] Yes Take 50 mg by mouth daily as needed for allergies. [provider] Taking Active   diphenoxylate-atropine (LOMOTIL) 2.5-0.025 MG tablet [id number] Yes Take 1 tablet by mouth 4 (four) times daily as needed for diarrhea or loose stools. [name], MD Taking Active   Eszopiclone 3 MG TABS [id number] Yes Take 1 tablet (3 mg total) by mouth at bedtime. [name], MD Taking Active   fexofenadine [facility]) 180 MG tablet [id number] Yes Take 180 mg by mouth daily. [provider] Taking Active   hyoscyamine (ANASPAZ) 0.125 MG TBDP disintergrating tablet [id number] Yes  DISSOLVE UNDER TONGUE 15 MINUTES BEFORE MEALS [name], MD Taking Active Self  insulin aspart (NOVOLOG FLEXPEN) 100 UNIT/ML FlexPen [id number] Yes Inject 8 units up to 3 times daily before meals [name], MD Taking Active Self           Med Note [facility], BRANDY L   Fri Sep 09, 2020  2:00 PM)    insulin glargine (LANTUS SOLOSTAR) 100 UNIT/ML Solostar Pen [id number] Yes Inject 15 Units into the skin daily. [name], MD Taking Active   Insulin Pen Needle (DROPLET PEN NEEDLES) 31G X 5 MM MISC [id number] Yes USE ONE NEEDLE SUBCUTANEOUSLY AS DIRECTED (REMOVE AND DISCARD NEEDLE IN SHARPS CONTAINER IMMEDIATELY AFTER USE) [name], MD Taking Active Self  ipratropium (ATROVENT) 0.06 % nasal spray [id number] Yes Place 2 sprays into both nostrils 4 (four) times daily as needed for rhinitis. [provider] Taking Active Self  loperamide (IMODIUM) 2 MG capsule [id number] Yes Take by mouth as needed. [provider] Taking Active Self  nitroGLYCERIN (NITROSTAT) 0.4 MG SL tablet [id number] Yes Place 1 tablet (0.4 mg total) under the tongue every 5 (five) minutes as needed for chest pain. Theora Gianotti, NP Taking Active   NONFORMULARY OR COMPOUNDED [place] [id number] Yes [facility]  Combination Pain Cream -  Baclofen 2%, Doxepin 5%, Gabapentin 6%, Topiramate 2%, Pentoxifylline 3% Apply 1-2 grams to affected area 3-4 times daily Qty. 120 gm 3 refills Gardiner Barefoot, DPM Taking Active Self           Med Note [facility], BRANDY L   Fri Sep 02, 2020  2:40 PM)    nystatin (MYCOSTATIN/NYSTOP) powder [id number] Yes Apply 1 g topically 4 (four) times daily as needed (rash). [provider] Taking Active   potassium chloride SA (KLOR-CON) 20 MEQ tablet [id number] Yes Take 1 tablet (20 mEq total) by mouth daily. End, Harrell Gave, MD Taking Active Self  pregabalin (LYRICA) 50 MG capsule [id number] Yes Take 1 capsule (50 mg total) by mouth 2 (two)  times daily. Kathrynn Ducking, MD Taking Active   torsemide (DEMADEX) 20 MG tablet [id number] Yes Take 1 tablet (  20 mg total) by mouth daily. End, Harrell Gave, MD Taking Active   traMADol (ULTRAM) 50 MG tablet [id number] Yes Take 0.5 tablets (25 mg total) by mouth every 12 (twelve) hours as needed for severe pain. Elmarie Shiley, MD Taking Active            Med Note [facility], BRANDY L   Fri Sep 02, 2020  2:40 PM)    umeclidinium-vilanterol [facility] ELLIPTA) 62.5-25 MCG/INH AEPB [id number] Yes Inhale 1 puff into the lungs daily. [name], MD Taking Active Self  Med List Note Jaymes Graff 07/22/17 1307): CPAP          Patient Active Problem List   Diagnosis Date Noted  . Hyperlipidemia associated with type 2 diabetes mellitus ([place]) 08/21/2020  . Iron deficiency 06/28/2020  . Skin lesion of chest wall 06/10/2020  . Elevated alkaline phosphatase level 06/10/2020  . Coagulation defect ([place]) 06/06/2020  . Chronic heart failure with preserved ejection fraction (HFpEF) ([place]) 01/14/2020  . Hyperlipidemia LDL goal <70 01/14/2020  . Coronary artery disease involving native coronary artery of native heart without angina pectoris 01/13/2020  . S/P cardiac catheterization 01/13/2020  . Accelerating angina ([place]) 12/31/2019  . Dysphagia 11/30/2019  . Gastritis 08/07/2019  . Lab test negative for COVID-19 virus 08/07/2019  . S/P panniculectomy 06/16/2019  . Major depressive disorder with current active episode 05/30/2019  . Exposure to COVID-19 virus 12/25/2018  . Functional diarrhea 10/22/2018  . Bilateral cataracts 06/26/2018  . Status post bariatric surgery 02/05/2018  . GAD (generalized anxiety disorder) 02/01/2018  . Rectocele 01/24/2018  . Vaginal prolapse 01/04/2018  . Hepatic steatosis 01/04/2018  . Microalbuminuria due to type 2 diabetes mellitus ([place]) 11/21/2017  . Hospital discharge follow-up 09/22/2017  . Hypocalcemia 09/22/2017  . Hypotension  09/22/2017  . Fecal incontinence 09/22/2017  . B12 deficiency 09/22/2017  . Transient disorientation 08/20/2017  . Rotator cuff arthropathy, right 08/13/2017  . Primary localized osteoarthrosis of shoulder 08/13/2017  . Encounter for preoperative examination for general surgical procedure 08/03/2017  . 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 Candidiasis, intertrigo 08/03/2017  . 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 Edema 12/13/2015  . Hypersomnia, persistent 06/18/2015  . Mechanical breakdown of implanted electronic neurostimulator of peripheral nerve ([place]) 10/16/2014  . Obesity hypoventilation syndrome ([place]) 08/13/2014  . Frequent falls 06/23/2014  . Chronic cough 05/05/2014  . Adenoma of left adrenal gland 03/24/2014  . New onset of headaches after age 47 03/23/2014  . Vitamin D deficiency 01/06/2014  . Weight gain following gastric bypass surgery 12/03/2013  . Morbid obesity ([place]) 08/25/2013  . Diaphragmatic hernia 04/06/2013  . Diabetic polyneuropathy ([place]) 03/25/2013  . Abnormality of gait 03/25/2013  . Multiple pulmonary nodules 03/20/2013  . DOE (dyspnea on exertion) 03/20/2013  . Anemia 08/06/2012  . Iron deficiency anemia 08/06/2012  . Functional disorder of bladder 07/15/2012  . Incomplete bladder emptying 07/15/2012  . 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 Urge incontinence 07/15/2012  . Increased frequency of urination 07/15/2012  . OSA on CPAP 03/02/2012  . Insomnia secondary to anxiety 02/29/2012  . Sciatica 09/05/2011  . Cervical stenosis of spinal canal 06/14/2011  . Restless legs syndrome 03/13/2011  . Degenerative disk disease 03/13/2011  . Essential hypertension 03/12/2011  . Uncontrolled type 2 diabetes with neuropathy ([place]) 03/12/2011  . Hearing loss, right 03/12/2011    Immunization History  Administered Date(s) Administered  . Fluad Quad(high Dose 65+)  03/06/2019, 06/07/2020   . Influenza Split 06/13/2011  . Influenza, High Dose Seasonal PF 08/21/2018  . Influenza,inj,Quad PF,6+ Mos 05/04/2014, 05/12/2015  . Influenza-Unspecified 04/15/2012  . Moderna Sars-Covid-2 Vaccination 02/02/2020, 03/01/2020, 09/01/2020  . Pneumococcal Conjugate-13 08/11/2013, 01/06/2014  . Pneumococcal Polysaccharide-23 01/19/2010, 09/03/2018  . Tdap 04/18/2016    Conditions to be addressed/monitored: CAD, HTN, HLD and DMII  Care Plan : Medication Management  Updates made by De Hollingshead, RPH-CPP since 11/15/2020 12:00 AM    Problem: Diabetes, CHF     Long-Range Goal: Disease Progression Prevention   This Visit's Progress: On track  Recent Progress: On track  Priority: High  Note:   Current Barriers:  . Unable to achieve control of diabetes   Pharmacist Clinical Goal(s):  [name] Over the next 90 days, patient will achieve control of diabetes as evidenced by improvement in A1c through collaboration with PharmD and provider  Interventions: . 1:1 collaboration with [name], MD regarding development and update of comprehensive plan of care as evidenced by provider attestation and co-signature . Inter-disciplinary care team collaboration (see longitudinal plan of care) . Comprehensive medication review performed; medication list updated in electronic medical record   Diabetes: . Uncontrolled; current treatment: Farxiga 10 mg daily, Lantus 15 units QAM, Novolog up to 8 units up to TID with meals  o Hx intolerance to metformin d/t diarrhea o Hx Trulicity - exacerbated diarrhea  o Hx glipizide - stopped previously when A1c was well controlled  o Hx Januvia - stopped previously when Trulicity was started  o Avoiding TZD due to HF . Patient to call [facility] to authorize continued fills for [place] 2. Provided sample today. Date of Download: 10/30/20-11/12/20 % Time CGM is active: 66% Average Glucose: 192 mg/dL Glucose Management Indicator: 7.9  Glucose Variability:  39.5 (goal <36%) Time in Goal:  - Time in range 70-180: 61% - Time above range: 39% - Time below range: 0% Observed patterns: elevations from 12-4 am. Patient denies eating at this time. Notes she gets up to use the bathroom, sometimes drinks a sip of a drink  . Discussed w/ PCP [name]. Move Lantus to PM administration. Continue Humalog with meals and [place] daily.  HFpEF: . Moderately well controlled; current regimen: torsemide 20 mg daily, potassium 20 mEq daily  . Concerned about lack of urine output and weight gain (over time). Worsened renal function with torsemide 40 mg daily.  . Most recent LVEF normal 65-70% . Discussed use of metolazone w/ PCP. PCP in agreement.   Hyperlipidemia, secondary ASCVD prevention: . Controlled per last lab; current treatment: atorvastatin 40 mg daily  . Most recent LHC showed nonobstructive CAD w/ patent stenting.  . Current antiplatelet regimen: aspirin 81 mg daily, clopidogrel 75 mg daily - plan for DAPT for full 12 months s/p stenting (12/2020) . Recommended to continue current regimen at this time along with cardiology collaboration.  Depression/Anxiety with Insomnia: . Moderately well managed. Current treatment: citalopram 10 mg daily, bupropion 150 mg QAM, 75 mg QPM, diazepam 5 mg QPM, Lunesta 3 mg QPM . Continue to monitor for increased sedation w/ multiple CNS sedating medications. Recommended to continue current regimen at this time  Chronic Diarrhea, S/p Gastric Bypass, Esophageal "growth" . Moderately well managed, given patient history. Current regimen: dicyclomine 10 mg QID PRN, hyoscyamine 10 mg QID PRN . Notes that she needs her esophagus stretched, but is awaiting cardiology clearance to be able to hold clopidogrel. . Previously recommended to continue current regimen  at this time  Nocturnal "leg cramps", as well as chronic pain . More exacerbated recently per patient report. Current regimen: pregabalin 100 mg QPM PRN;   . Previously recommended to continue current regimen at this time. Monitor for increased sedation with fluctuations in renal function  COPD:  . Well controlled; Current regimen: Anoro 62.5/25 mcg daily, albuterol HFA PRN, notes overall improvement in SOB recently . Previously recommended to continue current regimen at this time.   Patient Goals/Self-Care Activities . Over the next 90 days, patient will:  - take medications as prescribed check blood glucose at least three times daily using CGM, document, and provide at future appointments  Follow Up Plan: Face to Face appointment with care management team member scheduled for:  ~ 5 weeks       Medication Assistance: None required.  Patient affirms current coverage meets needs.  Patient's preferred pharmacy is:  [name] [address]Phone: [phone]: [phone] [facility] [place] [place] [address]Phone: [phone]: [phone] [facility] [place] [address]Phone: [phone]: [phone]  Follow Up:  Patient agrees to Care Plan and Follow-up.  Plan: Telephone follow up appointment with care management team member scheduled for:  ~ 5 weeks  Catie TDarnelle Maffucci PharmD, BM[place] CBeloitClinical Pharmacist LOccidental Petroleumat BJohnson & [phone]

## 2020-11-15 NOTE — Progress Notes (Signed)
Subjective:  Patient ID: Denise Sharp, female    DOB: 10/24/1945  Age: 75 y.o. MRN: [id number]  CC: The primary encounter diagnosis was Dysuria. Diagnoses of Stage 3b chronic kidney disease ([place]), Uncontrolled type 2 diabetes with neuropathy ([place]), Chronic heart failure with preserved ejection fraction (HFpEF) ([place]), Temporal arteritis syndrome ([place]), Other pulmonary embolism with acute cor pulmonale, unspecified chronicity (HCC), Mild episode of recurrent major depressive disorder ([place]), Disorder of adrenal gland, unspecified ([place]), Coagulation defect ([place]), Type 2 diabetes mellitus with left diabetic foot ulcer ([place]), Essential hypertension, Coronary artery disease involving native coronary artery of native heart without angina pectoris, Diabetic polyneuropathy associated with type 2 diabetes mellitus ([place]), Type 2 diabetes mellitus with diabetic neuropathy, without long-term current use of insulin ([place]), Abnormality of gait, Insomnia secondary to anxiety, Morbid obesity ([place]), and Bladder pain were also pertinent to this visit.  HPI Denise Sharp presents follow up on multiple issues, including new onset headaches, right sided heart failure,  And presumed anasarca    This visit occurred during the SARS-CoV-2 public health emergency.  Safety protocols were in place, including screening questions prior to the visit, additional usage of staff PPE, and extensive cleaning of exam room while observing appropriate contact time as indicated for disinfecting solutions.   1) fluid retention/anasarca.  Presumed based on ultrasound guided measurements by cardiology .  She has been taking torsemide at 20 mg daily ;  Higher doses resulted in AKI.  No prior trial of  metolazone.  Has abdominal distension.  Has been restricted to 2 L fluid daily.     2) cc of  suspected UTI.  She has been having low back pain for 10 days followed by development of bladder pain with voiding yesterday.  cannot provide a  urine sample at the present time. Taking Farxiga for diabetes/CAD   3) Right foot and lower tibia pain.  Started hurting last night.. no history of fall.  Has neuropathy and stumbles a lot .  No bruising or swelling   4)  Weakness, deconditioning.  Getting water PT at [facility] in [place] .  Has plans to continue water exercises at an exercise facility.  Feels lie  75 yr old in the water.    5)T2DM:  She  feels ok  not  exercising except with PT water therapy.  She is gaining weight  despite minimal food intake reported .  Checking  blood sugars several times daily using the CBG.  Information downloaded and printed.  Midnight to 2 am CBS are continually 200 .  BS have been under 130 fasting.  Adherence to diet unclear (eats hot dogs for lunch with bun) .  . . Appetite is small, ; she reports eating very little .  Outpatient Medications Prior to Visit  Medication Sig Dispense Refill  . albuterol (VENTOLIN HFA) 108 (90 Base) MCG/ACT inhaler Inhale 2 puffs into the lungs every 8 (eight) hours as needed for wheezing or shortness of breath. 8 g 2  . aspirin EC 81 MG tablet Take 1 tablet (81 mg total) by mouth daily. 30 tablet 2  . atorvastatin (LIPITOR) 40 MG tablet Take 1 tablet (40 mg total) by mouth daily. 90 tablet 0  . buPROPion (WELLBUTRIN) 75 MG tablet Take 1 tablet (75 mg total) by mouth 2 (two) times daily. 180 tablet 1  . butalbital-acetaminophen-caffeine (FIORICET) 50-325-40 MG tablet TAKE 1 TABLET BY MOUTH EVERY 6 HOURS AS NEEDED 60 tablet 5  . citalopram (CELEXA) 10 MG  tablet Take 1 tablet (10 mg total) by mouth daily. 90 tablet 1  . clopidogrel (PLAVIX) 75 MG tablet Take 1 tablet (75 mg total) by mouth daily. 90 tablet 0  . clotrimazole-betamethasone (LOTRISONE) cream Apply 1 application topically 2 (two) times daily as needed.    . Continuous Blood Gluc Receiver (FREESTYLE LIBRE 2 READER) DEVI Use to check sugar at least TID 1 each 1  . cyanocobalamin (,VITAMIN B-12,) 1000 MCG/ML injection  Inject 1 mL (1,000 mcg total) into the muscle every 30 (thirty) days. Inject 1 ml (1000 mcg ) IM weekly x 4,  Then monthly thereafter 3 mL 1  . dapagliflozin propanediol (FARXIGA) 10 MG TABS tablet Take 1 tablet (10 mg total) by mouth daily before breakfast. 90 tablet 1  . diazepam (VALIUM) 5 MG tablet Take 1 tablet (5 mg total) by mouth at bedtime as needed for anxiety. (Patient taking differently: Take 5 mg by mouth at bedtime.) 30 tablet 5  . diphenhydrAMINE (BENADRYL) 25 MG tablet Take 50 mg by mouth daily as needed for allergies.    . diphenoxylate-atropine (LOMOTIL) 2.5-0.025 MG tablet Take 1 tablet by mouth 4 (four) times daily as needed for diarrhea or loose stools. 60 tablet 5  . Eszopiclone 3 MG TABS Take 1 tablet (3 mg total) by mouth at bedtime. 90 tablet 1  . fexofenadine (ALLEGRA) 180 MG tablet Take 180 mg by mouth daily.    . hyoscyamine (ANASPAZ) 0.125 MG TBDP disintergrating tablet DISSOLVE UNDER TONGUE 15 MINUTES BEFORE MEALS 360 tablet 1  . insulin aspart (NOVOLOG FLEXPEN) 100 UNIT/ML FlexPen Inject 8 units up to 3 times daily before meals 15 mL 11  . insulin glargine (LANTUS SOLOSTAR) 100 UNIT/ML Solostar Pen Inject 15 Units into the skin daily. 15 mL 3  . Insulin Pen Needle (DROPLET PEN NEEDLES) 31G X 5 MM MISC USE ONE NEEDLE SUBCUTANEOUSLY AS DIRECTED (REMOVE AND DISCARD NEEDLE IN SHARPS CONTAINER IMMEDIATELY AFTER USE) 100 each 1  . ipratropium (ATROVENT) 0.06 % nasal spray Place 2 sprays into both nostrils 4 (four) times daily as needed for rhinitis.    [name] loperamide (IMODIUM) 2 MG capsule Take by mouth as needed.    . nitroGLYCERIN (NITROSTAT) 0.4 MG SL tablet Place 1 tablet (0.4 mg total) under the tongue every 5 (five) minutes as needed for chest pain. 25 tablet 1  . NONFORMULARY OR COMPOUNDED [place]  Combination Pain Cream -  Baclofen 2%, Doxepin 5%, Gabapentin 6%, Topiramate 2%, Pentoxifylline 3% Apply 1-2 grams to affected area 3-4 times daily Qty. 120  gm 3 refills 1 each 3  . nystatin (MYCOSTATIN/NYSTOP) powder Apply 1 g topically 4 (four) times daily as needed (rash).    . potassium chloride SA (KLOR-CON) 20 MEQ tablet Take 1 tablet (20 mEq total) by mouth daily. 30 tablet 5  . pregabalin (LYRICA) 50 MG capsule Take 1 capsule (50 mg total) by mouth 2 (two) times daily. 60 capsule 2  . torsemide (DEMADEX) 20 MG tablet Take 1 tablet (20 mg total) by mouth daily. 30 tablet 5  . traMADol (ULTRAM) 50 MG tablet Take 0.5 tablets (25 mg total) by mouth every 12 (twelve) hours as needed for severe pain. 30 tablet 0  . umeclidinium-vilanterol (ANORO ELLIPTA) 62.5-25 MCG/INH AEPB Inhale 1 puff into the lungs daily. 60 each 3   No facility-administered medications prior to visit.    Review of Systems;  Patient denies headache, fevers, malaise, unintentional weight loss, skin rash, eye pain, sinus  congestion and sinus pain, sore throat, dysphagia,  hemoptysis , cough, dyspnea, wheezing, chest pain, palpitations, orthopnea, edema, abdominal pain, nausea, melena, diarrhea, constipation, flank pain, dysuria, hematuria, urinary  Frequency, nocturia, numbness, tingling, seizures,  Focal weakness, Loss of consciousness,  Tremor, insomnia, depression, anxiety, and suicidal ideation.      Objective:  BP (!) 114/58 (BP Location: Left Arm, Patient Position: Sitting, Cuff Size: Normal)   Pulse 93   Temp (!) 96.6 F (35.9 C) (Temporal)   Resp 15   Ht 5' 4.5" (1.638 m)   Wt 251 lb 6.4 oz (114 kg)   SpO2 94%   BMI 42.49 kg/m   BP Readings from Last 3 Encounters:  11/15/20 (!) 114/58  11/02/20 110/70  09/28/20 100/60    Wt Readings from Last 3 Encounters:  11/15/20 251 lb 6.4 oz (114 kg)  11/02/20 244 lb (110.7 kg)  09/28/20 251 lb 4 oz (114 kg)    General appearance: alert, cooperative and appears stated age Ears: normal TM's and external ear canals both ears Throat: lips, mucosa, and tongue normal; teeth and gums normal Neck: no adenopathy, no  carotid bruit, supple, symmetrical, trachea midline and thyroid not enlarged, symmetric, no tenderness/mass/nodules Back: symmetric, no curvature. ROM normal. No CVA tenderness. Lungs: clear to auscultation bilaterally.  No rales  Heart: regular rate and rhythm, S1, S2 normal, no murmur, click, rub or gallop Abdomen: soft, non-tender; bowel sounds normal; no masses,  no organomegaly Pulses: 2+ and symmetric Skin: Skin color, texture, turgor normal. No rashes or lesions. No pitting edema . No bruising  Lymph nodes: Cervical, supraclavicular, and axillary nodes normal.  Lab Results  Component Value Date   HGBA1C 8.2 (A) 11/15/2020   HGBA1C 8.5 (H) 05/24/2020   HGBA1C 7.6 (H) 01/25/2020    Lab Results  Component Value Date   CREATININE 1.48 (H) 11/02/2020   CREATININE 1.50 (H) 09/28/2020   CREATININE 1.47 (H) 09/20/2020    Lab Results  Component Value Date   WBC 9.7 08/31/2020   HGB 13.7 08/31/2020   HCT 41.2 08/31/2020   PLT 277 08/31/2020   GLUCOSE 240 (H) 11/02/2020   CHOL 134 05/24/2020   TRIG 128.0 05/24/2020   HDL 60.40 05/24/2020   LDLDIRECT 111.0 06/16/2015   LDLCALC 48 05/24/2020   ALT 12 07/17/2020   AST 18 07/17/2020   NA 142 11/02/2020   K 3.9 11/02/2020   CL 98 11/02/2020   CREATININE 1.48 (H) 11/02/2020   BUN 22 11/02/2020   CO2 21 11/02/2020   TSH 3.695 07/17/2020   INR 1.1 12/31/2019   HGBA1C 8.2 (A) 11/15/2020   MICROALBUR 2.8 (H) 06/04/2017    No results found.  Assessment & Plan:   Problem List Items Addressed This Visit      Unprioritized   Diabetic polyneuropathy ([place]) (Chronic)    With frequent falls/stumbles due to foot drop. Multifactorial , with deconditioning leading to core and proximal muscle weakness to be addressed by cardiac rehab.       Abnormality of gait (Chronic)    She has a waddling gait due to neuropathy,  Charcot joint and morbid obesity.  Continue use of diabetic shoes and PT       Essential hypertension    At goal  on current regimen of torsemide ,       Relevant Medications   metolazone (ZAROXOLYN) 2.5 MG tablet   Type 2 diabetes mellitus with diabetic neuropathy, unspecified (HCC)    glycemic control is still  problematic despite access to frequent testing with CBG monitor, use of Lantus,  Farxiga and mealtime insulin. . She CONTINUES TO REQUIRE  the use of short acting insulin with each meal and is currently using 10 units qac and adjusting using a sliding scale.  Use of CBG moitor has enabled identification of midnight to 2 am blood sugar elevations occurring regularly,  She denies late night snacking,  But I suspect this may be occurring given no other explanation of her weight gain has been found.  Today I advised her to change  lantus to evening dosing as a trial , using 15  units to address midnight elevations   Lab Results  Component Value Date   HGBA1C 8.2 (A) 11/15/2020   Lab Results  Component Value Date   LABMICR Comment 11/11/2014   MICROALBUR 2.8 (H) 06/04/2017   MICROALBUR 9.5 (H) 06/29/2016            Insomnia secondary to anxiety    Currently taking lunesta 3 mg and valium 5 mg.  I iwll not increase her medications due to concurrent OSA?OHS syndrome      Morbid obesity ([place])    Despite prior bariatric surgery and more recently, panniulectomy.   Historically the only time she lost weight was when her daughter cooked for her and adhered to the [employer] diet        Bladder pain    She has no evidence of UTI or inflammation by UA/Culture. Symptoms are chronic and aggravated by glucosuria due to use of [place].      Coronary artery disease involving native coronary artery of native heart without angina pectoris    Participating in Cardiopulmonary rehab .  Taking lipitor asa, plavix metoprolol  And  Farxiga       Relevant Medications   metolazone (ZAROXOLYN) 2.5 MG tablet   Chronic heart failure with preserved ejection fraction (HFpEF) ([place])    Unclear if her constant weight  gain Is fluid vs fat.  Continue torsemide 20 mg daily,  Prn doses for additional weight gain.  Adding metolazone for gut edema       Relevant Medications   metolazone (ZAROXOLYN) 2.5 MG tablet   Coagulation defect (HCC)   Temporal arteritis syndrome (HCC)   Relevant Medications   metolazone (ZAROXOLYN) 2.5 MG tablet   Other pulmonary embolism with acute cor pulmonale, unspecified chronicity (HCC)   Relevant Medications   metolazone (ZAROXOLYN) 2.5 MG tablet   Mild episode of recurrent major depressive disorder (HCC)    Symptoms are stable on current therapy of citalopram 10 mg and wellbutrin  225 mg in divided doses .  No changes today        Other Visit Diagnoses    Dysuria    -  Primary   Relevant Orders   Urinalysis, Routine w reflex microscopic (Completed)   Urine Culture (Completed)   Stage 3b chronic kidney disease ([place])       Relevant Orders   Basic metabolic panel   Disorder of adrenal gland, unspecified ([place])   (Chronic)          40 minutes  Was spent  reviewing patient's current problems and recent encounters with her specialists, reviewing and ordering  labs and imaging studies, providing counseling on the above mentioned problems , and evaluating patient  In a face to face visit  .   I am having Tia Masker. Thelen start on metolazone. I am also having her maintain her traMADol, nystatin, NONFORMULARY OR  COMPOUNDED ITEM, diphenhydrAMINE, albuterol, aspirin EC, atorvastatin, clopidogrel, hyoscyamine, loperamide, Anoro Ellipta, butalbital-acetaminophen-caffeine, diazepam, Droplet Pen Needles, NovoLOG FlexPen, cyanocobalamin, potassium chloride SA, clotrimazole-betamethasone, ipratropium, citalopram, dapagliflozin propanediol, Eszopiclone, pregabalin, nitroGLYCERIN, FreeStyle Libre 2 Reader, torsemide, buPROPion, diphenoxylate-atropine, fexofenadine, and Lantus SoloStar.  Meds ordered this encounter  Medications  . metolazone (ZAROXOLYN) 2.5 MG tablet    Sig: Take 1  tablet (2.5 mg total) by mouth daily. 30 minutes before torsemide dose    Dispense:  30 tablet    Refill:  2    There are no discontinued medications.  Follow-up: Return in about 4 weeks (around 12/13/2020) for follow up diabetes.   [name], MD

## 2020-11-15 NOTE — Patient Instructions (Addendum)
Visit Information  PATIENT GOALS: Goals Addressed              This Visit's Progress     Patient Stated   .  Medication Monitoring (pt-stated)        Patient Goals/Self-Care Activities . Over the next 90 days, patient will:  - take medications as prescribed check blood glucose at least three times daily using CGM, document, and provide at future appointments        Patient verbalizes understanding of instructions provided today and agrees to view in [place].   Plan: Telephone follow up appointment with care management team member scheduled for:  ~ 5 weeks  Catie Darnelle Maffucci, PharmD, [place], [place] Clinical Pharmacist [employer] [facility] [employer] [phone]

## 2020-11-16 LAB — URINALYSIS, ROUTINE W REFLEX MICROSCOPIC
Bilirubin Urine: NEGATIVE
Hgb urine dipstick: NEGATIVE
Ketones, ur: NEGATIVE
Leukocytes,Ua: NEGATIVE
Nitrite: NEGATIVE
Specific Gravity, Urine: 1.015 (ref 1.000–1.030)
Total Protein, Urine: NEGATIVE
Urine Glucose: >=1000 — AB
Urobilinogen, UA: 0.2 (ref 0.0–1.0)
pH: 5 (ref 5.0–8.0)

## 2020-11-16 LAB — URINE CULTURE
MICRO NUMBER:: 11843733
Result:: NO GROWTH
SPECIMEN QUALITY:: ADEQUATE

## 2020-11-17 DIAGNOSIS — M316 Other giant cell arteritis: Secondary | ICD-10-CM | POA: Insufficient documentation

## 2020-11-17 DIAGNOSIS — I2609 Other pulmonary embolism with acute cor pulmonale: Secondary | ICD-10-CM | POA: Insufficient documentation

## 2020-11-17 DIAGNOSIS — F330 Major depressive disorder, recurrent, mild: Secondary | ICD-10-CM | POA: Insufficient documentation

## 2020-11-17 NOTE — Assessment & Plan Note (Addendum)
She has a waddling gait due to neuropathy,  Charcot joint and morbid obesity.  Continue use of diabetic shoes and PT

## 2020-11-17 NOTE — Assessment & Plan Note (Addendum)
Despite prior bariatric surgery and more recently, panniulectomy.   Historically the only time she lost weight was when her daughter cooked for her and adhered to the [place]

## 2020-11-17 NOTE — Assessment & Plan Note (Signed)
Symptoms are stable on current therapy of citalopram 10 mg and wellbutrin  225 mg in divided doses .  No changes today

## 2020-11-17 NOTE — Assessment & Plan Note (Signed)
Participating in Cardiopulmonary rehab .  Taking lipitor asa, plavix metoprolol  And  [place]

## 2020-11-17 NOTE — Assessment & Plan Note (Signed)
With frequent falls/stumbles due to foot drop. Multifactorial , with deconditioning leading to core and proximal muscle weakness to be addressed by cardiac rehab.

## 2020-11-17 NOTE — Assessment & Plan Note (Addendum)
glycemic control is still problematic despite access to frequent testing with CBG monitor, use of Lantus,  Farxiga and mealtime insulin. . She CONTINUES TO REQUIRE  the use of short acting insulin with each meal and is currently using 10 units qac and adjusting using a sliding scale.  Use of CBG moitor has enabled identification of midnight to 2 am blood sugar elevations occurring regularly,  She denies late night snacking,  But I suspect this may be occurring given no other explanation of her weight gain has been found.  Today I advised her to change  lantus to evening dosing as a trial , using 15  units to address midnight elevations   Lab Results  Component Value Date   HGBA1C 8.2 (A) 11/15/2020   Lab Results  Component Value Date   LABMICR Comment 11/11/2014   MICROALBUR 2.8 (H) 06/04/2017   MICROALBUR 9.5 (H) 06/29/2016

## 2020-11-17 NOTE — Assessment & Plan Note (Signed)
Currently taking lunesta 3 mg and valium 5 mg.  I iwll not increase her medications due to concurrent OSA?OHS syndrome

## 2020-11-17 NOTE — Assessment & Plan Note (Signed)
At goal on current regimen of torsemide ,

## 2020-11-17 NOTE — Assessment & Plan Note (Signed)
Unclear if her constant weight gain Is fluid vs fat.  Continue torsemide 20 mg daily,  Prn doses for additional weight gain.  Adding metolazone for gut edema

## 2020-11-17 NOTE — Assessment & Plan Note (Signed)
She has no evidence of UTI or inflammation by UA/Culture. Symptoms are chronic and aggravated by glucosuria due to use of [place].

## 2020-11-20 ENCOUNTER — Other Ambulatory Visit: Payer: Self-pay

## 2020-11-20 ENCOUNTER — Emergency Department
Admission: EM | Admit: 2020-11-20 | Discharge: 2020-11-20 | Disposition: A | Discharge status: Home / Self Care | Payer: Medicare Other | Attending: Emergency Medicine | Admitting: Emergency Medicine

## 2020-11-20 ENCOUNTER — Emergency Department: Payer: Medicare Other

## 2020-11-20 ENCOUNTER — Encounter: Payer: Self-pay | Admitting: Intensive Care

## 2020-11-20 DIAGNOSIS — Z23 Encounter for immunization: Secondary | ICD-10-CM | POA: Insufficient documentation

## 2020-11-20 DIAGNOSIS — Z7902 Long term (current) use of antithrombotics/antiplatelets: Secondary | ICD-10-CM | POA: Insufficient documentation

## 2020-11-20 DIAGNOSIS — Z794 Long term (current) use of insulin: Secondary | ICD-10-CM | POA: Diagnosis not present

## 2020-11-20 DIAGNOSIS — J9811 Atelectasis: Secondary | ICD-10-CM | POA: Diagnosis not present

## 2020-11-20 DIAGNOSIS — M25561 Pain in right knee: Secondary | ICD-10-CM | POA: Diagnosis not present

## 2020-11-20 DIAGNOSIS — E1140 Type 2 diabetes mellitus with diabetic neuropathy, unspecified: Secondary | ICD-10-CM | POA: Insufficient documentation

## 2020-11-20 DIAGNOSIS — Z043 Encounter for examination and observation following other accident: Secondary | ICD-10-CM | POA: Diagnosis not present

## 2020-11-20 DIAGNOSIS — S0992XA Unspecified injury of nose, initial encounter: Secondary | ICD-10-CM | POA: Diagnosis present

## 2020-11-20 DIAGNOSIS — Z96653 Presence of artificial knee joint, bilateral: Secondary | ICD-10-CM | POA: Insufficient documentation

## 2020-11-20 DIAGNOSIS — I10 Essential (primary) hypertension: Secondary | ICD-10-CM | POA: Insufficient documentation

## 2020-11-20 DIAGNOSIS — S8001XA Contusion of right knee, initial encounter: Secondary | ICD-10-CM | POA: Diagnosis not present

## 2020-11-20 DIAGNOSIS — M50322 Other cervical disc degeneration at C5-C6 level: Secondary | ICD-10-CM | POA: Diagnosis not present

## 2020-11-20 DIAGNOSIS — I2510 Atherosclerotic heart disease of native coronary artery without angina pectoris: Secondary | ICD-10-CM | POA: Insufficient documentation

## 2020-11-20 DIAGNOSIS — Y9301 Activity, walking, marching and hiking: Secondary | ICD-10-CM | POA: Insufficient documentation

## 2020-11-20 DIAGNOSIS — W010XXA Fall on same level from slipping, tripping and stumbling without subsequent striking against object, initial encounter: Secondary | ICD-10-CM | POA: Insufficient documentation

## 2020-11-20 DIAGNOSIS — E785 Hyperlipidemia, unspecified: Secondary | ICD-10-CM | POA: Diagnosis not present

## 2020-11-20 DIAGNOSIS — Z7982 Long term (current) use of aspirin: Secondary | ICD-10-CM | POA: Insufficient documentation

## 2020-11-20 DIAGNOSIS — S0990XA Unspecified injury of head, initial encounter: Secondary | ICD-10-CM | POA: Diagnosis not present

## 2020-11-20 DIAGNOSIS — S0121XA Laceration without foreign body of nose, initial encounter: Secondary | ICD-10-CM | POA: Diagnosis not present

## 2020-11-20 DIAGNOSIS — E1169 Type 2 diabetes mellitus with other specified complication: Secondary | ICD-10-CM | POA: Diagnosis not present

## 2020-11-20 DIAGNOSIS — Z041 Encounter for examination and observation following transport accident: Secondary | ICD-10-CM | POA: Diagnosis not present

## 2020-11-20 DIAGNOSIS — D689 Coagulation defect, unspecified: Secondary | ICD-10-CM | POA: Diagnosis not present

## 2020-11-20 DIAGNOSIS — M47812 Spondylosis without myelopathy or radiculopathy, cervical region: Secondary | ICD-10-CM | POA: Diagnosis not present

## 2020-11-20 DIAGNOSIS — Z79899 Other long term (current) drug therapy: Secondary | ICD-10-CM | POA: Diagnosis not present

## 2020-11-20 DIAGNOSIS — Y9222 Religious institution as the place of occurrence of the external cause: Secondary | ICD-10-CM | POA: Diagnosis not present

## 2020-11-20 DIAGNOSIS — R221 Localized swelling, mass and lump, neck: Secondary | ICD-10-CM | POA: Diagnosis not present

## 2020-11-20 DIAGNOSIS — M7918 Myalgia, other site: Secondary | ICD-10-CM

## 2020-11-20 MED ORDER — TETANUS-DIPHTH-ACELL PERTUSSIS 5-2.5-18.5 LF-MCG/0.5 IM SUSY
0.5000 mL | PREFILLED_SYRINGE | Freq: Once | INTRAMUSCULAR | Status: AC
  Administered 2020-11-20: 0.5 mL via INTRAMUSCULAR
  Filled 2020-11-20: qty 0.5

## 2020-11-20 MED ORDER — BACITRACIN-NEOMYCIN-POLYMYXIN 400-5-5000 EX OINT
TOPICAL_OINTMENT | Freq: Once | CUTANEOUS | Status: AC
  Administered 2020-11-20: 1 via TOPICAL
  Filled 2020-11-20: qty 1

## 2020-11-20 MED ORDER — ACETAMINOPHEN 325 MG PO TABS
650.0000 mg | ORAL_TABLET | Freq: Once | ORAL | Status: AC
  Administered 2020-11-20: 650 mg via ORAL
  Filled 2020-11-20: qty 2

## 2020-11-20 MED ORDER — HYDROCODONE-ACETAMINOPHEN 5-325 MG PO TABS: 1.0000 | ORAL_TABLET | Freq: Four times a day (QID) | ORAL | 0 refills | Status: AC | PRN

## 2020-11-20 NOTE — ED Notes (Signed)
Patient to CT at this time

## 2020-11-20 NOTE — ED Notes (Signed)
Right knee cleaned with sterile saline, ointment and light gauze bandage applied.

## 2020-11-20 NOTE — Discharge Instructions (Signed)
Please keep the laceration clean and dry.   Follow up with primary care for concerns.  Return to the ER if unable to schedule an appointment.

## 2020-11-20 NOTE — ED Provider Notes (Signed)
[facility] Emergency Department Provider Note ____________________________________________   Event Date/Time   First MD Initiated Contact with Patient 11/20/20 1010     (approximate)  I have reviewed the triage vital signs and the nursing notes.   HISTORY  Chief Complaint Fall  HPI Denise Sharp is a 75 y.o. 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 She was walking into [place], tripped over a curb, and fell. She landed on her right knee and hit her face on the concrete.      Past Medical History:  Diagnosis Date  . (HFpEF) heart failure with preserved ejection fraction ([place])    a. 12/2019 Echo: EF 65-70%, Gr2 DD. No significant valvular dzs.  . Abnormality of gait 03/25/2013  . Adrenal mass, left ([place])   . Anemia    iron deficient post  2 unit txfsn 2009, normal endo/colonoscopy by [facility]  . Arthritis   . CAD (coronary artery disease) with h/o Atypical Chest Pain    a. 04/1986 Cath ([facility]): nl cors, EF 65%; b. 07/2012 Cath [facility]): Diff minor irregs; c. 07/2016 MV [facility]): "Equivocal"; d. 08/2016 Cardiac CT Ca2+ score [facility]): Ca2+ 7517; e. 05/2019 MV: No ischemia. EF 75%; f. 12/2019 PCI: LM nl, LAD 65ost (3.5x12 Resolute DES), 64m (2.75x12 Resolute DES),LCX/RCA nl; g. 08/2020 Cath: patent LAD stents, RCA 40ost, elev RH pressures, EF >65%.  . Cervical spinal stenosis 1994   due to trauma to back ([employer] accident), has intermittent paralysis and parasthesias  . Cervicogenic headache 03/23/2014  . Depression   . Dizziness    chronic dizziness  . DJD (degenerative joint disease)    a. Chronic R shoulder pain pending R shoulder replacement 07/2017.  [name] Esophageal stenosis March 2011   with transietn outlet obstruction by food, cleared by EGD   . Family history of adverse reaction to  anesthesia    daughter PONV  . Gastric bypass status for obesity   . Headache(784.0)   . Hypertension   . IBS (irritable bowel syndrome)   . Left bundle branch block (LBBB)    a. Intermittently present - likely rate related. - patient denies  . Obesity   . Obstructive sleep apnea    using CPAP  . 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 Restless leg syndrome   . Rotator cuff arthropathy, right 08/13/2017  . Syncope and collapse 03/12/2014  . Type II diabetes mellitus [facility])     Patient Active Problem List   Diagnosis Date Noted  . Temporal arteritis syndrome ([place]) 11/17/2020  . Other pulmonary embolism with acute cor pulmonale, unspecified chronicity ([place]) 11/17/2020  . Mild episode of recurrent major depressive disorder ([place]) 11/17/2020  . Hyperlipidemia associated with type 2 diabetes mellitus ([place]) 08/21/2020  . Iron deficiency 06/28/2020  . Skin lesion of chest wall 06/10/2020  . Elevated alkaline phosphatase level 06/10/2020  . Coagulation defect ([place]) 06/06/2020  . Chronic heart failure with preserved ejection fraction (HFpEF) ([place]) 01/14/2020  . Hyperlipidemia LDL goal <70 01/14/2020  . Coronary artery disease involving native coronary artery of native heart without angina pectoris 01/13/2020  . S/P cardiac catheterization 01/13/2020  . Accelerating angina ([place]) 12/31/2019  . Dysphagia 11/30/2019  . Bladder pain 08/23/2019  . Gastritis 08/07/2019  . Lab test negative for COVID-19 virus 08/07/2019  . S/P panniculectomy 06/16/2019  . Major depressive disorder with  current active episode 05/30/2019  . Exposure to COVID-19 virus 12/25/2018  . Functional diarrhea 10/22/2018  . Bilateral cataracts 06/26/2018  . Status post bariatric surgery 02/05/2018  . GAD (generalized anxiety disorder) 02/01/2018  . Rectocele 01/24/2018  . Vaginal prolapse 01/04/2018  . Hepatic steatosis 01/04/2018  . Microalbuminuria due to type 2 diabetes mellitus ([place]) 11/21/2017  . Hospital  discharge follow-up 09/22/2017  . Hypocalcemia 09/22/2017  . Hypotension 09/22/2017  . Fecal incontinence 09/22/2017  . B12 deficiency 09/22/2017  . Transient disorientation 08/20/2017  . Rotator cuff arthropathy, right 08/13/2017  . Primary localized osteoarthrosis of shoulder 08/13/2017  . Encounter for preoperative examination for general surgical procedure 08/03/2017  . 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 Candidiasis, intertrigo 08/03/2017  . 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 Edema 12/13/2015  . Hypersomnia, persistent 06/18/2015  . Mechanical breakdown of implanted electronic neurostimulator of peripheral nerve ([place]) 10/16/2014  . Obesity hypoventilation syndrome ([place]) 08/13/2014  . Frequent falls 06/23/2014  . Chronic cough 05/05/2014  . Adenoma of left adrenal gland 03/24/2014  . New onset of headaches after age 40 03/23/2014  . Vitamin D deficiency 01/06/2014  . Weight gain following gastric bypass surgery 12/03/2013  . Morbid obesity ([place]) 08/25/2013  . Diaphragmatic hernia 04/06/2013  . Diabetic polyneuropathy ([place]) 03/25/2013  . Abnormality of gait 03/25/2013  . Multiple pulmonary nodules 03/20/2013  . DOE (dyspnea on exertion) 03/20/2013  . Iron deficiency anemia 08/06/2012  . Functional disorder of bladder 07/15/2012  . Incomplete bladder emptying 07/15/2012  . 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 Urge incontinence 07/15/2012  . Increased frequency of urination 07/15/2012  . OSA on CPAP 03/02/2012  . Insomnia secondary to anxiety 02/29/2012  . Sciatica 09/05/2011  . Cervical stenosis of spinal canal 06/14/2011  . Restless legs syndrome 03/13/2011  . Essential hypertension 03/12/2011  . Type 2 diabetes mellitus with diabetic neuropathy, unspecified ([place]) 03/12/2011  . Hearing loss, right 03/12/2011    Past Surgical History:  Procedure Laterality Date  . ABDOMINAL HYSTERECTOMY     . APPENDECTOMY    . CARDIAC CATHETERIZATION    . COLONOSCOPY    . CORONARY STENT INTERVENTION N/A 01/04/2020   Procedure: CORONARY STENT INTERVENTION;  Surgeon: [name], MD;  Location: [place] [facility];  Service: Cardiovascular;  Laterality: N/A;  LAD   . DIAPHRAGMATIC HERNIA REPAIR  2015  . ESOPHAGEAL DILATION     multiple  . ESOPHAGOGASTRODUODENOSCOPY    . [place]  . GALLBLADDER SURGERY  resection  . GASTRIC BYPASS    . GASTRIC BYPASS  2000, 2005   Dr. Debroah Loop  . [facility] IMPLANT PLACEMENT  April 2013   [place]  . JOINT REPLACEMENT  2007   bilateral knee. [name],  [name]  . LEFT HEART CATH AND CORS/GRAFTS ANGIOGRAPHY N/A 12/31/2019   Procedure: LEFT HEART CATH AND CORS/GRAFTS ANGIOGRAPHY;  Surgeon: [name], MD;  Location: [place] [facility];  Service: Cardiovascular;  Laterality: N/A;  . PANNICULECTOMY  06/16/2019  . PANNICULECTOMY N/A 06/16/2019   Procedure: PANNICULECTOMY;  Surgeon: [name], MD;  Location: [place];  Service: Plastics;  Laterality: N/A;  3 hours, please  . REVERSE SHOULDER ARTHROPLASTY Right 08/13/2017   Procedure: REVERSE RIGHT SHOULDER ARTHROPLASTY;  Surgeon: [name], MD;  Location: [place];  Service: Orthopedics;  Laterality: Right;  . RIGHT/LEFT HEART CATH AND CORONARY ANGIOGRAPHY N/A 09/06/2020   Procedure: RIGHT/LEFT HEART CATH AND  CORONARY ANGIOGRAPHY;  Surgeon: [name], MD;  Location: [place] [facility];  Service: Cardiovascular;  Laterality: N/A;  . ROTATOR CUFF REPAIR     right  . [place]  . TOTAL ABDOMINAL HYSTERECTOMY W/ BILATERAL SALPINGOOPHORECTOMY  1974  . UMBILICAL HERNIA REPAIR  Aug 11, 2015    Prior to Admission medications   Medication Sig Start Date End Date Taking? Authorizing Provider  HYDROcodone-acetaminophen (NORCO/VICODIN) 5-325 MG tablet Take 1 tablet by mouth every 6 (six) hours as needed for up to 3 days for severe pain. 11/20/20 11/23/20 Yes Lynzee Lindquist,  Goddess Gebbia B, FNP  albuterol (VENTOLIN HFA) 108 (90 Base) MCG/ACT inhaler Inhale 2 puffs into the lungs every 8 (eight) hours as needed for wheezing or shortness of breath. 08/07/19   [name], MD  aspirin EC 81 MG tablet Take 1 tablet (81 mg total) by mouth daily. 01/05/20   Fritzi Mandes, MD  atorvastatin (LIPITOR) 40 MG tablet Take 1 tablet (40 mg total) by mouth daily. 03/07/20   Dunn, Areta Haber, PA-C  buPROPion (WELLBUTRIN) 75 MG tablet Take 1 tablet (75 mg total) by mouth 2 (two) times daily. 10/20/20   [name], MD  butalbital-acetaminophen-caffeine (FIORICET) [phone] MG tablet TAKE 1 TABLET BY MOUTH EVERY 6 HOURS AS NEEDED 05/25/20   [name], MD  citalopram (CELEXA) 10 MG tablet Take 1 tablet (10 mg total) by mouth daily. 09/12/20   [name], MD  clopidogrel (PLAVIX) 75 MG tablet Take 1 tablet (75 mg total) by mouth daily. 03/07/20   Dunn, Areta Haber, PA-C  clotrimazole-betamethasone (LOTRISONE) cream Apply 1 application topically 2 (two) times daily as needed.    [provider]  Continuous Blood Gluc Receiver (FREESTYLE LIBRE 2 READER) DEVI Use to check sugar at least TID 09/26/20   [name], MD  cyanocobalamin (,VITAMIN B-12,) 1000 MCG/ML injection Inject 1 mL (1,000 mcg total) into the muscle every 30 (thirty) days. Inject 1 ml (1000 mcg ) IM weekly x 4,  Then monthly thereafter 07/29/20   [name], MD  dapagliflozin propanediol (FARXIGA) 10 MG TABS tablet Take 1 tablet (10 mg total) by mouth daily before breakfast. 09/12/20   [name], MD  diazepam (VALIUM) 5 MG tablet Take 1 tablet (5 mg total) by mouth at bedtime as needed for anxiety. Patient taking differently: Take 5 mg by mouth at bedtime. 05/27/20   [name], MD  diphenhydrAMINE (BENADRYL) 25 MG tablet Take 50 mg by mouth daily as needed for allergies.    [provider]  diphenoxylate-atropine (LOMOTIL) 2.5-0.025 MG tablet Take 1 tablet by mouth 4 (four) times daily as needed for  diarrhea or loose stools. 10/20/20   [name], MD  Eszopiclone 3 MG TABS Take 1 tablet (3 mg total) by mouth at bedtime. 09/12/20   [name], MD  fexofenadine (ALLEGRA) 180 MG tablet Take 180 mg by mouth daily.    [provider]  hyoscyamine (ANASPAZ) 0.125 MG TBDP disintergrating tablet DISSOLVE UNDER TONGUE 15 MINUTES BEFORE MEALS 04/21/20   [name], MD  insulin aspart (NOVOLOG FLEXPEN) 100 UNIT/ML FlexPen Inject 8 units up to 3 times daily before meals 06/23/20   [name], MD  insulin glargine (LANTUS SOLOSTAR) 100 UNIT/ML Solostar Pen Inject 15 Units into the skin daily. 10/26/20   [name], MD  Insulin Pen Needle (DROPLET PEN NEEDLES) 31G X 5 MM MISC USE  ONE NEEDLE SUBCUTANEOUSLY AS DIRECTED (REMOVE AND DISCARD NEEDLE IN SHARPS CONTAINER IMMEDIATELY AFTER USE) 06/13/20   [name], MD  ipratropium (ATROVENT) 0.06 % nasal spray Place 2 sprays into both nostrils 4 (four) times daily as needed for rhinitis.    [provider]  loperamide (IMODIUM) 2 MG capsule Take by mouth as needed.    [provider]  metolazone (ZAROXOLYN) 2.5 MG tablet Take 1 tablet (2.5 mg total) by mouth daily. 30 minutes before torsemide dose 11/15/20   [name], MD  nitroGLYCERIN (NITROSTAT) 0.4 MG SL tablet Place 1 tablet (0.4 mg total) under the tongue every 5 (five) minutes as needed for chest pain. 09/23/20   Theora Gianotti, NP  NONFORMULARY OR COMPOUNDED [place]  Combination Pain Cream -  Baclofen 2%, Doxepin 5%, Gabapentin 6%, Topiramate 2%, Pentoxifylline 3% Apply 1-2 grams to affected area 3-4 times daily Qty. 120 gm 3 refills 10/14/17   Gardiner Barefoot, DPM  nystatin (MYCOSTATIN/NYSTOP) powder Apply 1 g topically 4 (four) times daily as needed (rash).    [provider]  potassium chloride SA (KLOR-CON) 20 MEQ tablet Take 1 tablet (20 mEq total) by mouth daily. 08/19/20   End, Harrell Gave, MD  pregabalin (LYRICA)  50 MG capsule Take 1 capsule (50 mg total) by mouth 2 (two) times daily. 09/13/20   Kathrynn Ducking, MD  torsemide (DEMADEX) 20 MG tablet Take 1 tablet (20 mg total) by mouth daily. 10/03/20   End, Harrell Gave, MD  traMADol (ULTRAM) 50 MG tablet Take 0.5 tablets (25 mg total) by mouth every 12 (twelve) hours as needed for severe pain. 08/21/17   Regalado, Belkys A, MD  umeclidinium-vilanterol (ANORO ELLIPTA) 62.5-25 MCG/INH AEPB Inhale 1 puff into the lungs daily. 05/24/20   [name], MD    Allergies Demeclocycline, Erythromycin, Flagyl [metronidazole], Glucophage [metformin hcl], Tetracyclines & related, Diovan [valsartan], Sulfa antibiotics, and Xanax [alprazolam]  Family History  Problem Relation Age of Onset  . Heart disease Father   . Hypertension Father   . Prostate cancer Father   . Stroke Father   . Osteoporosis Father   . Stroke Mother   . Depression Mother   . Headache Mother   . Heart disease Mother   . Thyroid disease Mother   . Hypertension Mother   . Diabetes Daughter   . Heart disease Daughter   . Hypertension Daughter   . Hypertension Son     Social History Social History   Tobacco Use  . Smoking status: Never Smoker  . Smokeless tobacco: Never Used  Vaping Use  . Vaping Use: Never used  Substance Use Topics  . Alcohol use: No  . Drug use: Yes    Comment: prescribed valium    Review of Systems  Constitutional: No fever/chills  Eyes: No visual changes. ENT: No sore throat. Cardiovascular: Denies chest pain. Respiratory: Denies shortness of breath. Gastrointestinal: No abdominal pain.  No nausea, no vomiting.   Genitourinary: Negative for dysuria. Musculoskeletal: Negative for back pain.  Negative for neck pain.  Positive for right knee pain Skin: Negative for rash.  Positive for laceration over right side of bridge of nose and facial abrasions. Neurological: Positive for headaches, negative focal weakness or  numbness. ____________________________________________   PHYSICAL EXAM:  VITAL SIGNS: ED Triage Vitals  Enc Vitals Group     BP 11/20/20 1008 (!) 109/50     Pulse Rate 11/20/20 1008 78     Resp 11/20/20 1008 16  Temp 11/20/20 1008 98.2 F (36.8 C)     Temp Source 11/20/20 1008 Oral     SpO2 11/20/20 1008 97 %     Weight 11/20/20 1003 244 lb (110.7 kg)     Height 11/20/20 1003 5' 4.5" (1.638 m)     Head Circumference --      Peak Flow --      Pain Score 11/20/20 1003 8     Pain Loc --      Pain Edu? --      Excl. in [place]? --     Constitutional: Alert and oriented.  Uncomfortable appearing and in no acute distress. Eyes: Conjunctivae are normal. PERRL. EOMI. Head: Atraumatic. Nose: No congestion/rhinnorhea. Mouth/Throat: Mucous membranes are moist.  Oropharynx non-erythematous.  Early ecchymosis overlying upper lip.  No dental injury.  No active bleeding Neck: No stridor.   Hematological/Lymphatic/Immunilogical: No cervical lymphadenopathy. Cardiovascular: Normal rate, regular rhythm. Grossly normal heart sounds.  Good peripheral circulation. Respiratory: Normal respiratory effort.  No retractions. Lungs CTAB. Gastrointestinal: Soft and nontender. No distention. No abdominal bruits. Genitourinary:  Musculoskeletal: Prepatellar tenderness over the right knee. Neurologic:  Normal speech and language. No gross focal neurologic deficits are appreciated. No gait instability. Skin:  0.5cm laceration to the right upper bridge of nose. Scattered abrasions over the face with early ecchymosis. Psychiatric: Mood and affect are normal. Speech and behavior are normal.  ____________________________________________   LABS (all labs ordered are listed, but only abnormal results are displayed)  Labs Reviewed - No data to display ____________________________________________  EKG  Not indicated. ____________________________________________  RADIOLOGY  ED MD interpretation:     Right knee and chest images are negative for acute findings.  CT maxillofacial, head, and cervical spine negative for acute concerns.  I, Sherrie George, personally viewed and evaluated these images (plain radiographs) as part of my medical decision making, as well as reviewing the written report by the radiologist.  Official radiology report(s): DG Chest 2 View  Result Date: 11/20/2020 CLINICAL DATA:  Pain after a fall EXAM: CHEST - 2 VIEW COMPARISON:  Chest radiograph dated 10/15/2018 and CT chest dated 11/11/2019. FINDINGS: The heart size and mediastinal contours are within normal limits. There is mild left lower lung atelectasis/scarring. The right lung is clear. There is no pleural effusion or pneumothorax. Right shoulder arthroplasty is noted. Degenerative changes are seen in the spine. IMPRESSION: Mild left lower lung atelectasis/scarring. Electronically Signed   By: Zerita Boers M.D.   On: 11/20/2020 11:36   CT Head Wo Contrast  Result Date: 11/20/2020 CLINICAL DATA:  Fall EXAM: CT HEAD WITHOUT CONTRAST CT MAXILLOFACIAL WITHOUT CONTRAST CT CERVICAL SPINE WITHOUT CONTRAST TECHNIQUE: Multidetector CT imaging of the head, cervical spine, and maxillofacial structures were performed using the standard protocol without intravenous contrast. Multiplanar CT image reconstructions of the cervical spine and maxillofacial structures were also generated. COMPARISON:  None. FINDINGS: CT HEAD FINDINGS Brain: No evidence of acute infarction, hemorrhage, hydrocephalus, extra-axial collection or mass lesion/mass effect. Scattered dural calcifications. Mild periventricular and subcortical white matter hypoattenuation consistent with chronic microvascular ischemic white matter disease. Vascular: No hyperdense vessel or unexpected calcification. Skull: Normal. Negative for fracture or focal lesion. Hyperostosis frontalis interna. Other: None. CT MAXILLOFACIAL FINDINGS Osseous: No fracture or mandibular  dislocation. No destructive process. Orbits: Negative. No traumatic or inflammatory finding. Sinuses: Largely clear. Opacification of 1 of the frontal sinus air cells is noted. Soft tissues: Mild soft tissue swelling overlying the bridge of the nose more right than left.  No evidence of underlying fracture. Mild irregularity of the skin consistent with reported laceration. CT CERVICAL SPINE FINDINGS Alignment: Normal. Skull base and vertebrae: Limited visualization of the mid and distal cervical spine secondary to marked beam hardening artifact related to patient body habitus. No convincing evidence of fracture or malalignment. Soft tissues and spinal canal: No prevertebral fluid or swelling. No visible canal hematoma within the limitations as described above. Disc levels: Bulky degenerative calcifications at the atlantodental interval. Multilevel degenerative disc disease most significant at C5-C6. multilevel bilateral facet arthropathy. Upper chest: Negative. Other: None. IMPRESSION: CT HEAD 1. No acute intracranial abnormality. CT FACE 1. Focal soft tissue contusion and laceration overlying the bridge of the nose without evidence of underlying fracture. 2. 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 CT CSPINE 1. No acute fracture or malalignment. 2. Multilevel cervical spondylosis and bilateral facet arthropathy noted incidentally. Electronically Signed   By: Jacqulynn Cadet M.D.   On: 11/20/2020 11:35   CT Cervical Spine Wo Contrast  Result Date: 11/20/2020 CLINICAL DATA:  Fall EXAM: CT HEAD WITHOUT CONTRAST CT MAXILLOFACIAL WITHOUT CONTRAST CT CERVICAL SPINE WITHOUT CONTRAST TECHNIQUE: Multidetector CT imaging of the head, cervical spine, and maxillofacial structures were performed using the standard protocol without intravenous contrast. Multiplanar CT image reconstructions of the cervical spine and maxillofacial structures were also generated.  COMPARISON:  None. FINDINGS: CT HEAD FINDINGS Brain: No evidence of acute infarction, hemorrhage, hydrocephalus, extra-axial collection or mass lesion/mass effect. Scattered dural calcifications. Mild periventricular and subcortical white matter hypoattenuation consistent with chronic microvascular ischemic white matter disease. Vascular: No hyperdense vessel or unexpected calcification. Skull: Normal. Negative for fracture or focal lesion. Hyperostosis frontalis interna. Other: None. CT MAXILLOFACIAL FINDINGS Osseous: No fracture or mandibular dislocation. No destructive process. Orbits: Negative. No traumatic or inflammatory finding. Sinuses: Largely clear. Opacification of 1 of the frontal sinus air cells is noted. Soft tissues: Mild soft tissue swelling overlying the bridge of the nose more right than left. No evidence of underlying fracture. Mild irregularity of the skin consistent with reported laceration. CT CERVICAL SPINE FINDINGS Alignment: Normal. Skull base and vertebrae: Limited visualization of the mid and distal cervical spine secondary to marked beam hardening artifact related to patient body habitus. No convincing evidence of fracture or malalignment. Soft tissues and spinal canal: No prevertebral fluid or swelling. No visible canal hematoma within the limitations as described above. Disc levels: Bulky degenerative calcifications at the atlantodental interval. Multilevel degenerative disc disease most significant at C5-C6. multilevel bilateral facet arthropathy. Upper chest: Negative. Other: None. IMPRESSION: CT HEAD 1. No acute intracranial abnormality. CT FACE 1. Focal soft tissue contusion and laceration overlying the bridge of the nose without evidence of underlying fracture. 2. 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 CT CSPINE 1. No acute fracture or malalignment. 2. Multilevel cervical spondylosis and bilateral facet arthropathy  noted incidentally. Electronically Signed   By: Jacqulynn Cadet M.D.   On: 11/20/2020 11:35   DG Knee Complete 4 Views Right  Result Date: 11/20/2020 CLINICAL DATA:  Status post fall EXAM: RIGHT KNEE - COMPLETE 4+ VIEW COMPARISON:  None. FINDINGS: Surgical changes of total knee arthroplasty without evidence of hardware complication or periprosthetic fracture. Soft tissue thickening overlies the lower pole of the patella and the patellar ligament consistent with soft tissue contusion. No knee joint effusion. IMPRESSION: Prepatellar soft tissue contusion without evidence of underlying fracture or malalignment. Total knee arthroplasty without  hardware complication. Electronically Signed   By: Jacqulynn Cadet M.D.   On: 11/20/2020 10:25   CT Maxillofacial Wo Contrast  Result Date: 11/20/2020 CLINICAL DATA:  Fall EXAM: CT HEAD WITHOUT CONTRAST CT MAXILLOFACIAL WITHOUT CONTRAST CT CERVICAL SPINE WITHOUT CONTRAST TECHNIQUE: Multidetector CT imaging of the head, cervical spine, and maxillofacial structures were performed using the standard protocol without intravenous contrast. Multiplanar CT image reconstructions of the cervical spine and maxillofacial structures were also generated. COMPARISON:  None. FINDINGS: CT HEAD FINDINGS Brain: No evidence of acute infarction, hemorrhage, hydrocephalus, extra-axial collection or mass lesion/mass effect. Scattered dural calcifications. Mild periventricular and subcortical white matter hypoattenuation consistent with chronic microvascular ischemic white matter disease. Vascular: No hyperdense vessel or unexpected calcification. Skull: Normal. Negative for fracture or focal lesion. Hyperostosis frontalis interna. Other: None. CT MAXILLOFACIAL FINDINGS Osseous: No fracture or mandibular dislocation. No destructive process. Orbits: Negative. No traumatic or inflammatory finding. Sinuses: Largely clear. Opacification of 1 of the frontal sinus air cells is noted. Soft tissues:  Mild soft tissue swelling overlying the bridge of the nose more right than left. No evidence of underlying fracture. Mild irregularity of the skin consistent with reported laceration. CT CERVICAL SPINE FINDINGS Alignment: Normal. Skull base and vertebrae: Limited visualization of the mid and distal cervical spine secondary to marked beam hardening artifact related to patient body habitus. No convincing evidence of fracture or malalignment. Soft tissues and spinal canal: No prevertebral fluid or swelling. No visible canal hematoma within the limitations as described above. Disc levels: Bulky degenerative calcifications at the atlantodental interval. Multilevel degenerative disc disease most significant at C5-C6. multilevel bilateral facet arthropathy. Upper chest: Negative. Other: None. IMPRESSION: CT HEAD 1. No acute intracranial abnormality. CT FACE 1. Focal soft tissue contusion and laceration overlying the bridge of the nose without evidence of underlying fracture. 2. 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 CT CSPINE 1. No acute fracture or malalignment. 2. Multilevel cervical spondylosis and bilateral facet arthropathy noted incidentally. Electronically Signed   By: Jacqulynn Cadet M.D.   On: 11/20/2020 11:35    ____________________________________________   PROCEDURES  Procedure(s) performed (including Critical Care):  Marland KitchenMarland KitchenLaceration Repair  Date/Time: 11/20/2020 3:34 PM Performed by: Victorino Dike, FNP Authorized by: Victorino Dike, FNP   Consent:    Consent obtained:  Verbal   Consent given by:  Patient   Risks discussed:  Poor cosmetic result Universal protocol:    Imaging studies available: yes   Laceration details:    Location:  Face   Face location:  Nose   Length (cm):  1 Treatment:    Area cleansed with:  Chlorhexidine   Irrigation solution:  Sterile saline   Irrigation method:  Tap Skin repair:    Repair  method:  Tissue adhesive Approximation:    Approximation:  Close Repair type:    Repair type:  Simple Post-procedure details:    Dressing:  Open (no dressing)    ____________________________________________   INITIAL IMPRESSION / ASSESSMENT AND PLAN   76 year old female presenting to the ER after mechanical non-syncopal fall. See HPI for further details. Plan will be to get CT head, cervical spine, and maxillofacial bones. Will also x-ray right knee. She is unsure of her last Tdap booster and will receive one today.  DIFFERENTIAL DIAGNOSIS  Intracranial abnormality, facial fracture, knee injury  ED COURSE  75 year old female presenting to the emergency department after sustaining a mechanical, nonsyncopal fall prior to arrival.  See HPI for further details.  CT of the head, cervical spine, and maxillofacial bones are all negative for acute concerns.  Imaging of the right knee is negative for acute findings as well.  Wound overlying the bridge of the nose cleaned and repaired as above.  Tdap booster given.  Abrasion to the prepatellar surface of the right knee cleaned and bandaged.  Patient states that she takes tramadol on a routine basis and feels that she will need a stronger pain medication.  She was advised that narcotics may cause dizziness, drowsiness, and increased risk of falls.  She understands these risks.  Because this was a mechanical and nonsyncopal fall it seems reasonable to prescribe a few Norco.  Patient will be discharged home with head injury instructions as well as wound care instructions.  Strict ER return precautions discussed.  Patient is to follow-up with her primary care provider for nonemergent symptoms of concern.      ___________________________________________   FINAL CLINICAL IMPRESSION(S) / ED DIAGNOSES  Final diagnoses:  Minor head injury, initial encounter  Laceration of nose without complication, initial encounter  Musculoskeletal pain      ED Discharge Orders         Ordered    HYDROcodone-acetaminophen (NORCO/VICODIN) 5-325 MG tablet  Every 6 hours PRN        11/20/20 1212           Denise Sharp was evaluated in Emergency Department on 11/20/2020 for the symptoms described in the history of present illness. She was evaluated in the context of the global COVID-19 pandemic, which necessitated consideration that the patient might be at risk for infection with the SARS-CoV-2 virus that causes COVID-19. Institutional protocols and algorithms that pertain to the evaluation of patients at risk for COVID-19 are in a state of rapid change based on information released by regulatory bodies including the CDC and federal and state organizations. These policies and algorithms were followed during the patient's care in the ED.   Note:  This document was prepared using Dragon voice recognition software and may include unintentional dictation errors.   Victorino Dike, FNP 11/20/20 [place]    Naaman Plummer, MD 11/21/20 [phone]

## 2020-11-20 NOTE — ED Triage Notes (Signed)
Patient [place] curb [place] this [place]. Patient has bruising and laceration noted to nose and right side of face. Bruising and abrasion noted to right knee. PAtient reports being able to ambulate after fall to car to come to ER.

## 2020-11-22 ENCOUNTER — Other Ambulatory Visit: Payer: Self-pay

## 2020-11-22 ENCOUNTER — Other Ambulatory Visit (INDEPENDENT_AMBULATORY_CARE_PROVIDER_SITE_OTHER): Payer: Medicare Other

## 2020-11-22 DIAGNOSIS — N1832 Chronic kidney disease, stage 3b: Secondary | ICD-10-CM

## 2020-11-23 ENCOUNTER — Other Ambulatory Visit: Payer: Self-pay | Admitting: Internal Medicine

## 2020-11-23 ENCOUNTER — Telehealth: Payer: Self-pay | Admitting: *Deleted

## 2020-11-23 DIAGNOSIS — N179 Acute kidney failure, unspecified: Secondary | ICD-10-CM

## 2020-11-23 DIAGNOSIS — N1832 Chronic kidney disease, stage 3b: Secondary | ICD-10-CM

## 2020-11-23 LAB — BASIC METABOLIC PANEL
BUN: 42 mg/dL — ABNORMAL HIGH (ref 6–23)
CO2: 32 mEq/L (ref 19–32)
Calcium: 9.3 mg/dL (ref 8.4–10.5)
Chloride: 92 mEq/L — ABNORMAL LOW (ref 96–112)
Creatinine, Ser: 1.83 mg/dL — ABNORMAL HIGH (ref 0.40–1.20)
GFR: 26.75 mL/min — ABNORMAL LOW (ref >60.00)
Glucose, Bld: 191 mg/dL — ABNORMAL HIGH (ref 70–99)
Potassium: 3.6 mEq/L (ref 3.5–5.1)
Sodium: 139 mEq/L (ref 135–145)

## 2020-11-23 MED ORDER — METOPROLOL TARTRATE 25 MG PO TABS: 12.5000 mg | ORAL_TABLET | Freq: Two times a day (BID) | ORAL | 3 refills | Status: DC

## 2020-11-23 MED ORDER — METOPROLOL TARTRATE 25 MG PO TABS: 12.5000 mg | ORAL_TABLET | Freq: Two times a day (BID) | ORAL | 1 refills | Status: DC

## 2020-11-23 NOTE — Telephone Encounter (Signed)
Spoke with patient and reviewed results and recommendations. She requested that I send in 30 day supply with one refill to [facility] and then send another to [facility] for mail order. Reviewed that the metoprolol 25 mg should be taken 1/2 tablet twice a day. She verbalized understanding of results and recommendations. Confirmed upcoming appointment as well. She verbalized understanding with no further questions at this time.

## 2020-11-23 NOTE — Telephone Encounter (Signed)
-----   Message from Theora Gianotti, NP sent at 11/23/2020  9:52 AM EDT ----- Multiple brief episodes of fast heart beats stemming from both the top and bottom chambers.  These did not result in triggers and were presumably asymptomatic.  Triggered events were largely associated with normal heart rhythms, though there was some artifact on the lead, so difficult to say for sure that she wasn't having an arrhythmia that was obscured by the artifact.  One triggered event was associated with a PVC.  Considering documentation of fast heart beats, I'd like her to start metoprolol 12.5mg  BID.  With recent echo and cath findings, no additional w/u is required at this time.  Keep f/u with Dr. Saunders Revel on 6/1.

## 2020-11-25 ENCOUNTER — Other Ambulatory Visit: Payer: Self-pay | Admitting: Internal Medicine

## 2020-11-25 ENCOUNTER — Ambulatory Visit: Payer: Medicare Other | Admitting: *Deleted

## 2020-11-25 DIAGNOSIS — E1140 Type 2 diabetes mellitus with diabetic neuropathy, unspecified: Secondary | ICD-10-CM

## 2020-11-25 DIAGNOSIS — I2510 Atherosclerotic heart disease of native coronary artery without angina pectoris: Secondary | ICD-10-CM | POA: Diagnosis not present

## 2020-11-25 DIAGNOSIS — I5032 Chronic diastolic (congestive) heart failure: Secondary | ICD-10-CM

## 2020-11-25 DIAGNOSIS — I5033 Acute on chronic diastolic (congestive) heart failure: Secondary | ICD-10-CM

## 2020-11-25 DIAGNOSIS — I10 Essential (primary) hypertension: Secondary | ICD-10-CM | POA: Diagnosis not present

## 2020-11-25 DIAGNOSIS — E1165 Type 2 diabetes mellitus with hyperglycemia: Secondary | ICD-10-CM

## 2020-11-25 MED ORDER — TORSEMIDE 10 MG PO TABS: 10.0000 mg | ORAL_TABLET | Freq: Every day | ORAL | 2 refills | Status: DC

## 2020-11-25 NOTE — Patient Instructions (Signed)
Visit Information  PATIENT GOALS: Goals Addressed            This Visit's Progress   . (RNCM) Monitor and Manage My Blood Sugar-Diabetes Type 2   On track    Timeframe:  Long-Range Goal Priority:  High Start Date:     10/19/20                      Expected End Date:   04/14/21                    Follow Up Date 12/09/20    . Check blood sugar at least 3 times a day . Check blood sugar if I feel it is too high or too low . Enter blood sugar readings and medication/insulin into daily log for provider review . Schedule yearly diabetic eye exam . Contact CCM Pharmacist or PCP if blood sugars sustained over 250 consistently . Try to avoid drinking sweet tea with regular sugar . Use walker or wheelchair for all ambulation   Why is this important?    Checking your blood sugar at home helps to keep it from getting very high or very low.   Writing the results in a diary or log helps the doctor know how to care for you.   Your blood sugar log should have the time, date and the results.   Also, write down the amount of insulin or other medicine that you take.   Other information, like what you ate, exercise done and how you were feeling, will also be helpful.     Notes:     [name] (RNCM) Track and Manage Fluids and Swelling-Heart Failure   On track    Timeframe:  Long-Range Goal Priority:  Medium Start Date: 10/19/20                            Expected End Date:    04/14/21                   Follow Up Date 12/09/20    . Call office if I gain more than 2 pounds in one day or 5 pounds in one week . Watch for swelling in feet, ankles and legs every day . Weigh myself daily, first thing in morning after emptying bladder . Record weighs in log for provider review . Eat fresh or frozen vegetables, avoid canned and processed foods . Increase activity as tolerated . Monitor urine output with decrease dose of fluid medication   Why is this important?    It is important to check your weight  daily and watch how much salt and liquids you have.   It will help you to manage your heart failure.    Notes:        Patient verbalizes understanding of instructions provided today and agrees to view in [place].   The care management team will reach out to the patient again over the next 20 business days.   Hubert Azure RN, MSN RN Care Management Coordinator [place] [phone] [email or web address]

## 2020-11-25 NOTE — Chronic Care Management (AMB) (Signed)
Chronic Care Management   CCM RN Visit Note  11/25/2020 Name: Denise Sharp MRN: [id number] DOB: October 31, 1945  Subjective: Denise Sharp is a 75 y.o. year old female who is a primary care patient of [name], Aris Everts, MD. The care management team was consulted for assistance with disease management and care coordination needs.    Engaged with patient by telephone for follow up visit in response to provider referral for case management and/or care coordination services.   Consent to Services:  The patient was given information about Chronic Care Management services, agreed to services, and gave verbal consent prior to initiation of services.  Please see initial visit note for detailed documentation.   Patient agreed to services and verbal consent obtained.   Assessment: Review of patient past medical history, allergies, medications, health status, including review of consultants reports, laboratory and other test data, was performed as part of comprehensive evaluation and provision of chronic care management services.   SDOH (Social Determinants of Health) assessments and interventions performed:    CCM Care Plan  Allergies  Allergen Reactions  . Demeclocycline Hives  . Erythromycin Nausea And Vomiting and Other (See Comments)    Severe irritable bowel  . Flagyl [Metronidazole] Nausea And Vomiting and Other (See Comments)    Severe irritable bowel  . Glucophage [Metformin Hcl] Nausea And Vomiting and Other (See Comments)    "Sick" "I won't take anything that has metformin in it"  . Tetracyclines & Related Hives and Rash  . Diovan [Valsartan] Nausea Only       . Sulfa Antibiotics Rash and Other (See Comments)    As child  . Xanax [Alprazolam] Other (See Comments)    Unknown reaction    Outpatient Encounter Medications as of 11/25/2020  Medication Sig Note  . dapagliflozin propanediol (FARXIGA) 10 MG TABS tablet Take 1 tablet (10 mg total) by mouth daily before breakfast.    . insulin aspart (NOVOLOG FLEXPEN) 100 UNIT/ML FlexPen Inject 8 units up to 3 times daily before meals   . insulin glargine (LANTUS SOLOSTAR) 100 UNIT/ML Solostar Pen Inject 15 Units into the skin daily.   [name] torsemide (DEMADEX) 20 MG tablet Take 1 tablet (20 mg total) by mouth daily. 11/25/2020: Reports was told to take 10 mg daily  . albuterol (VENTOLIN HFA) 108 (90 Base) MCG/ACT inhaler Inhale 2 puffs into the lungs every 8 (eight) hours as needed for wheezing or shortness of breath.   [name] aspirin EC 81 MG tablet Take 1 tablet (81 mg total) by mouth daily.   [name] atorvastatin (LIPITOR) 40 MG tablet Take 1 tablet (40 mg total) by mouth daily.   [name] buPROPion (WELLBUTRIN) 75 MG tablet Take 1 tablet (75 mg total) by mouth 2 (two) times daily.   . butalbital-acetaminophen-caffeine (FIORICET) 50-325-40 MG tablet TAKE 1 TABLET BY MOUTH EVERY 6 HOURS AS NEEDED   . citalopram (CELEXA) 10 MG tablet Take 1 tablet (10 mg total) by mouth daily.   . clopidogrel (PLAVIX) 75 MG tablet Take 1 tablet (75 mg total) by mouth daily.   . clotrimazole-betamethasone (LOTRISONE) cream Apply 1 application topically 2 (two) times daily as needed.   . Continuous Blood Gluc Receiver (FREESTYLE LIBRE 2 READER) DEVI Use to check sugar at least TID   . cyanocobalamin (,VITAMIN B-12,) 1000 MCG/ML injection Inject 1 mL (1,000 mcg total) into the muscle every 30 (thirty) days. Inject 1 ml (1000 mcg ) IM weekly x 4,  Then monthly thereafter   .  diazepam (VALIUM) 5 MG tablet Take 1 tablet (5 mg total) by mouth at bedtime as needed for anxiety. (Patient taking differently: Take 5 mg by mouth at bedtime.)   . diphenhydrAMINE (BENADRYL) 25 MG tablet Take 50 mg by mouth daily as needed for allergies.   . diphenoxylate-atropine (LOMOTIL) 2.5-0.025 MG tablet Take 1 tablet by mouth 4 (four) times daily as needed for diarrhea or loose stools.   . Eszopiclone 3 MG TABS Take 1 tablet (3 mg total) by mouth at bedtime.   . fexofenadine (ALLEGRA) 180  MG tablet Take 180 mg by mouth daily.   . hyoscyamine (ANASPAZ) 0.125 MG TBDP disintergrating tablet DISSOLVE UNDER TONGUE 15 MINUTES BEFORE MEALS   . Insulin Pen Needle (DROPLET PEN NEEDLES) 31G X 5 MM MISC USE ONE NEEDLE SUBCUTANEOUSLY AS DIRECTED (REMOVE AND DISCARD NEEDLE IN SHARPS CONTAINER IMMEDIATELY AFTER USE)   . ipratropium (ATROVENT) 0.06 % nasal spray Place 2 sprays into both nostrils 4 (four) times daily as needed for rhinitis.   [name] loperamide (IMODIUM) 2 MG capsule Take by mouth as needed.   . metolazone (ZAROXOLYN) 2.5 MG tablet Take 1 tablet (2.5 mg total) by mouth daily. 30 minutes before torsemide dose   . metoprolol tartrate (LOPRESSOR) 25 MG tablet Take 0.5 tablets (12.5 mg total) by mouth 2 (two) times daily. 11/25/2020: Picking up from pharmacy  . metoprolol tartrate (LOPRESSOR) 25 MG tablet Take 0.5 tablets (12.5 mg total) by mouth 2 (two) times daily.   . nitroGLYCERIN (NITROSTAT) 0.4 MG SL tablet Place 1 tablet (0.4 mg total) under the tongue every 5 (five) minutes as needed for chest pain.   . NONFORMULARY OR COMPOUNDED [place]  Combination Pain Cream -  Baclofen 2%, Doxepin 5%, Gabapentin 6%, Topiramate 2%, Pentoxifylline 3% Apply 1-2 grams to affected area 3-4 times daily Qty. 120 gm 3 refills   . nystatin (MYCOSTATIN/NYSTOP) powder Apply 1 g topically 4 (four) times daily as needed (rash).   . potassium chloride SA (KLOR-CON) 20 MEQ tablet Take 1 tablet (20 mEq total) by mouth daily.   . pregabalin (LYRICA) 50 MG capsule Take 1 capsule (50 mg total) by mouth 2 (two) times daily.   . traMADol (ULTRAM) 50 MG tablet Take 0.5 tablets (25 mg total) by mouth every 12 (twelve) hours as needed for severe pain.   [name] umeclidinium-vilanterol (ANORO ELLIPTA) 62.5-25 MCG/INH AEPB Inhale 1 puff into the lungs daily.    No facility-administered encounter medications on file as of 11/25/2020.    Patient Active Problem List   Diagnosis Date Noted  . Temporal arteritis  syndrome ([place]) 11/17/2020  . Other pulmonary embolism with acute cor pulmonale, unspecified chronicity ([place]) 11/17/2020  . Mild episode of recurrent major depressive disorder ([place]) 11/17/2020  . Hyperlipidemia associated with type 2 diabetes mellitus ([place]) 08/21/2020  . Iron deficiency 06/28/2020  . Skin lesion of chest wall 06/10/2020  . Elevated alkaline phosphatase level 06/10/2020  . Coagulation defect ([place]) 06/06/2020  . Chronic heart failure with preserved ejection fraction (HFpEF) ([place]) 01/14/2020  . Hyperlipidemia LDL goal <70 01/14/2020  . Coronary artery disease involving native coronary artery of native heart without angina pectoris 01/13/2020  . S/P cardiac catheterization 01/13/2020  . Accelerating angina ([place]) 12/31/2019  . Dysphagia 11/30/2019  . Bladder pain 08/23/2019  . Gastritis 08/07/2019  . Lab test negative for COVID-19 virus 08/07/2019  . S/P panniculectomy 06/16/2019  . Major depressive disorder with current active episode 05/30/2019  . Exposure to COVID-19 virus  12/25/2018  . Functional diarrhea 10/22/2018  . Bilateral cataracts 06/26/2018  . Status post bariatric surgery 02/05/2018  . GAD (generalized anxiety disorder) 02/01/2018  . Rectocele 01/24/2018  . Vaginal prolapse 01/04/2018  . Hepatic steatosis 01/04/2018  . Microalbuminuria due to type 2 diabetes mellitus ([place]) 11/21/2017  . Hospital discharge follow-up 09/22/2017  . Hypocalcemia 09/22/2017  . Hypotension 09/22/2017  . Fecal incontinence 09/22/2017  . B12 deficiency 09/22/2017  . Transient disorientation 08/20/2017  . Rotator cuff arthropathy, right 08/13/2017  . Primary localized osteoarthrosis of shoulder 08/13/2017  . Encounter for preoperative examination for general surgical procedure 08/03/2017  . 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 Candidiasis, intertrigo 08/03/2017  . 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 Edema  12/13/2015  . Hypersomnia, persistent 06/18/2015  . Mechanical breakdown of implanted electronic neurostimulator of peripheral nerve ([place]) 10/16/2014  . Obesity hypoventilation syndrome ([place]) 08/13/2014  . Frequent falls 06/23/2014  . Chronic cough 05/05/2014  . Adenoma of left adrenal gland 03/24/2014  . New onset of headaches after age 34 03/23/2014  . Vitamin D deficiency 01/06/2014  . Weight gain following gastric bypass surgery 12/03/2013  . Morbid obesity ([place]) 08/25/2013  . Diaphragmatic hernia 04/06/2013  . Diabetic polyneuropathy ([place]) 03/25/2013  . Abnormality of gait 03/25/2013  . Multiple pulmonary nodules 03/20/2013  . DOE (dyspnea on exertion) 03/20/2013  . Iron deficiency anemia 08/06/2012  . Functional disorder of bladder 07/15/2012  . Incomplete bladder emptying 07/15/2012  . 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 Urge incontinence 07/15/2012  . Increased frequency of urination 07/15/2012  . OSA on CPAP 03/02/2012  . Insomnia secondary to anxiety 02/29/2012  . Sciatica 09/05/2011  . Cervical stenosis of spinal canal 06/14/2011  . Restless legs syndrome 03/13/2011  . Essential hypertension 03/12/2011  . Type 2 diabetes mellitus with diabetic neuropathy, unspecified ([place]) 03/12/2011  . Hearing loss, right 03/12/2011    Conditions to be addressed/monitored:CHF and DMII  Care Plan : Heart Failure (Adult)  Updates made by Leona Singleton, RN since 11/25/2020 12:00 AM  Problem: Symptom Exacerbation (Heart Failure)   Priority: Medium  Long-Range Goal: Symptom Exacerbation Prevented or Minimized   Start Date: 10/19/2020  Expected End Date: 04/14/2021  This Visit's Progress: On track  Recent Progress: On track  Priority: Medium  Current Barriers:  [name] Knowledge deficit related to basic heart failure pathophysiology and self care management patient reporting she continues to deny shortness of breath or lower extremity swelling (does have some swelling in her  right knee from fall).  Continues to weigh daily.  Weight this morning was 244 pounds.  Patient concerned about decrease dose in fluid medication (Torsemide-started reduced 10 mg dose today).  Discussed daily weight monitoring and when to call provider based on weight.  Discussed importance of taking Metolazone 30 minutes prior to Torsemide.  Reporting needing to start new medication for periods of increased heart rates after wearing heart monitor (Lopressor-still needs to pick up from pharmacy). Case Manager Clinical Goal(s):  . patient will weigh self daily and record . patient will verbalize understanding of Heart Failure Action Plan and when to call doctor . patient will take all Heart Failure mediations as prescribed . patient will weigh daily and record (notifying MD of 3 lb weight gain over night or 5 lb in a week) Interventions:  . Collaboration with [name], MD regarding development and update of comprehensive plan of care as evidenced by provider  attestation and co-signature . Inter-disciplinary care team collaboration (see longitudinal plan of care) . Basic overview and discussion of pathophysiology of Heart Failure reviewed  . Provided verbal education on low sodium diet . Provided education about placing scale on hard, flat surface and advised patient to weigh each morning after emptying bladder . Discussed importance of daily weight and advised patient to weigh and record daily . Reviewed role of diuretics in prevention of fluid overload and management of heart failure; encouraged compliance with current dosing of Demadex; encouraged to monitor urine output with reduced dose and weight and to notify provider for increased weight or decreased output . Discussed importance of taking Metolazone 30 minutes prior to taking Demadex . Barriers to lifestyle changes reviewed and addressed and barriers to treatment reviewed and addressed . Medication-adherence assessment completed and  encouraged . Rescue (action) plan reviewed . Discussed palpitations and encouraged patient to pick new medication up from pharmacy and to start medication as prescribed . Self-awareness of signs/symptoms of worsening disease encouraged . Encouraged to increase activity or exercise based on tolerance and encouraged to pace activity allowing for rest . Reviewed foods high in sodium, discussed eating fresh fruits and vegetables and avoiding canned and processed foods, encouraged to continue to read food labels Patient Goals/Self-Care Activities: . Call office if I gain more than 2 pounds in one day or 5 pounds in one week . Watch for swelling in feet, ankles and legs every day . Weigh myself daily, first thing in morning after emptying bladder . Record weighs in log for provider review . Eat fresh or frozen vegetables, avoid canned and processed foods . Increase activity as tolerated . Monitor urine output with decrease dose of fluid medication Follow Up Plan: The care management team will reach out to the patient again over the next 20 business days.     Care Plan : Diabetes Type 2 (Adult)  Updates made by Leona Singleton, RN since 11/25/2020 12:00 AM  Problem: Glycemic Management (Diabetes, Type 2)   Priority: High  Long-Range Goal: Patient will report no sustained fasting blood sugars greater than 200 within the next 60 days   Start Date: 10/19/2020  Expected End Date: 04/14/2021  This Visit's Progress: Not on track  Recent Progress: Not on track  Priority: High  Objective:  Lab Results  Component Value Date   HGBA1C 8.2 (A) 11/15/2020  Current Barriers:  [name] Knowledge Deficits related to basic Diabetes pathophysiology and self care/management as evidenced by patient 's continued elevated blood sugars.  Fasting blood sugar this morning was 159 with recent fasting ranges of 140-200's.  (200's in the last week).  Denies any hypoglycemic episodes any recent change in diet.  Does report fall  last Sunday on Mother's Day while going into church.  Resulted in abrasion/laceration to face, cuts in mouth, right knee swollen, etc.  Still complaining of dizziness with standing (encouraged to contact primary care provider) Case Manager Clinical Goal(s):  . patient will demonstrate improved adherence to prescribed treatment plan for diabetes self care/management as evidenced by: 3 times a day monitoring and recording of CBG,  adherence to ADA/ carb modified diet, adherence to prescribed medication regimen, contacting provider for new or worsened symptoms or questions Interventions:  . Collaboration with [name], MD regarding development and update of comprehensive plan of care as evidenced by provider attestation and co-signature . Inter-disciplinary care team collaboration (see longitudinal plan of care) . Provided education to patient about basic DM disease  process . Reviewed medications with patient and discussed importance of medication adherence . Discussed plans with patient for ongoing care management follow up and provided patient with direct contact information for care management team . Provided patient with educational materials related to hypo and hyperglycemia and importance of correct treatment . Advised patient, providing education and rationale, to check cbg 3 times a day and record, calling CCM Pharmacist or PCP for findings outside established parameters.   . Barriers to adherence to treatment plan identified . Blood glucose readings reviewed and discussed ways to help lower ranges . Resources required to improve adherence to care identified . Self-awareness of signs/symptoms of hypo or hyperglycemia encouraged . Use of blood glucose monitoring log promoted . Discussed minimizing drinking sweet tea and reviewed healthier low carbohydrate food and drink options . Confirmed patient has not had yearly eye exam and encouraged patient to schedule as soon as  possible . Encouraged to continue to work with CCM Pharmacist; encouraged patient to contact CCM Pharmacist for sustained elevations . Encouraged to attend all scheduled provider appointments  . Discussed and encouraged to adhere to prescribed ADA/carb modified  Assessed barriers to management plan, such as food insecurity, age, developmental ability, depression, anxiety, fear of hypoglycemia or weight gain, as well as medication cost, side effects and complicated regimen.   Discussed continuous blood sugar monitoring system and need to scan multiple times a day to check blood sugars  Fall precautions and preventions reviewed and discussed; encouraged to use walker with all ambulation;  Instructed to contact primary care provider for continued dizziness Patient Goals/Self-Care Activities: . Check blood sugar at least 3 times a day . Check blood sugar if I feel it is too high or too low . Enter blood sugar readings and medication/insulin into daily log for provider review . Schedule yearly diabetic eye exam . Contact CCM Pharmacist or PCP if blood sugars sustained over 250 consistently . Try to avoid drinking sweet tea with regular sugar . Use walker or wheelchair for all ambulation Follow Up Plan: The care management team will reach out to the patient again over the next 20 business days.       Plan:The care management team will reach out to the patient again over the next 20 business days.  Hubert Azure RN, MSN RN Care Management Coordinator [place] [phone] [email or web address]

## 2020-11-27 IMAGING — DX DG SHOULDER 2+V*R*
3 series · 3 of 3 positions shown · non-contrast
Comparison: None.

CLINICAL DATA: Fall

EXAM:
RIGHT SHOULDER - 2+ VIEW

[shoulder axial]
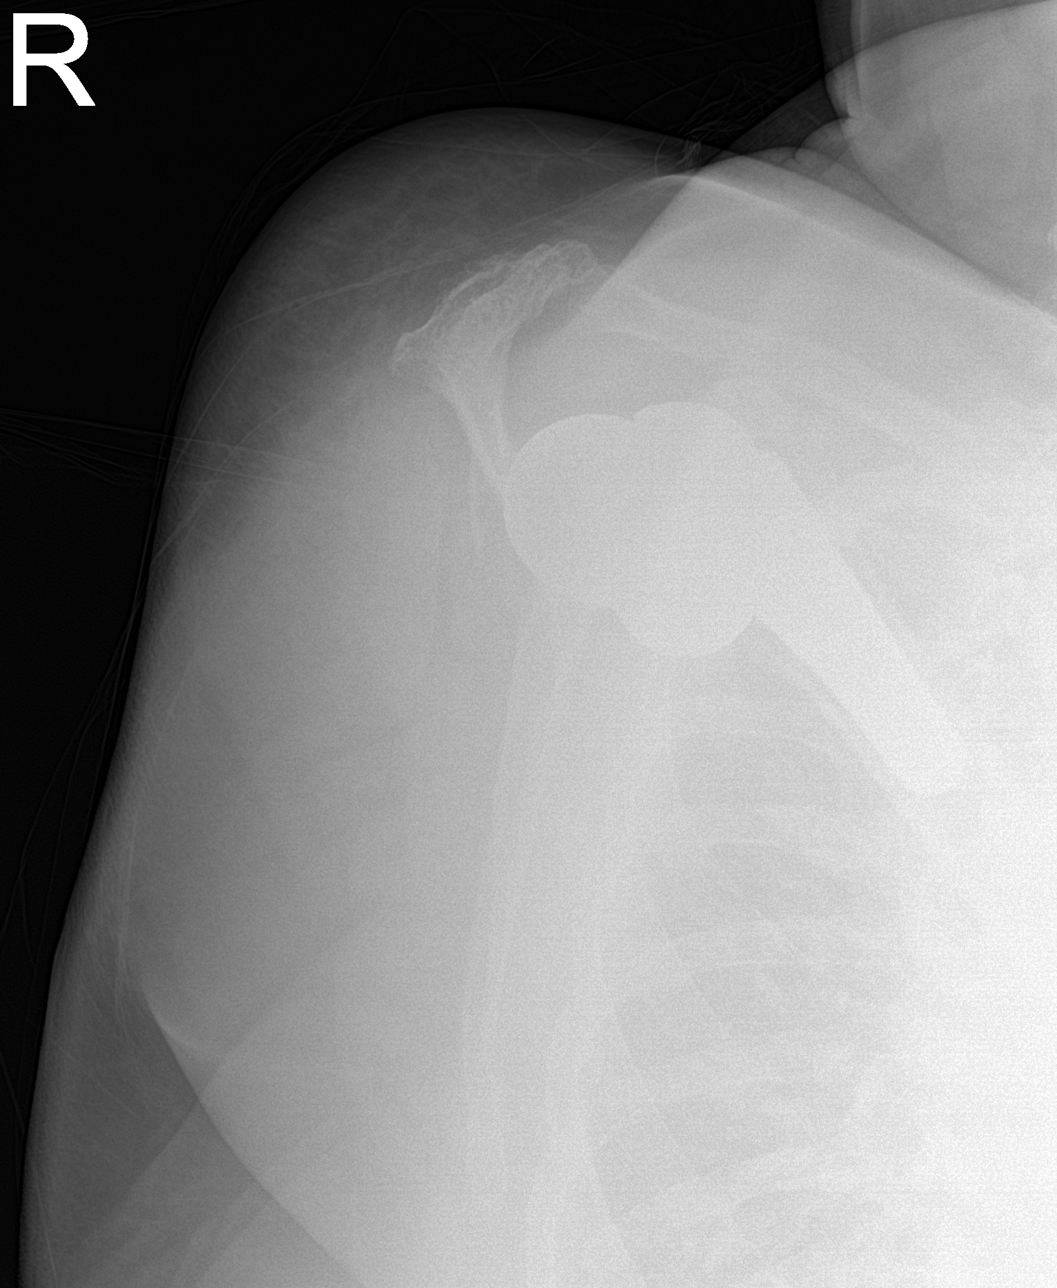

[shoulder ap]
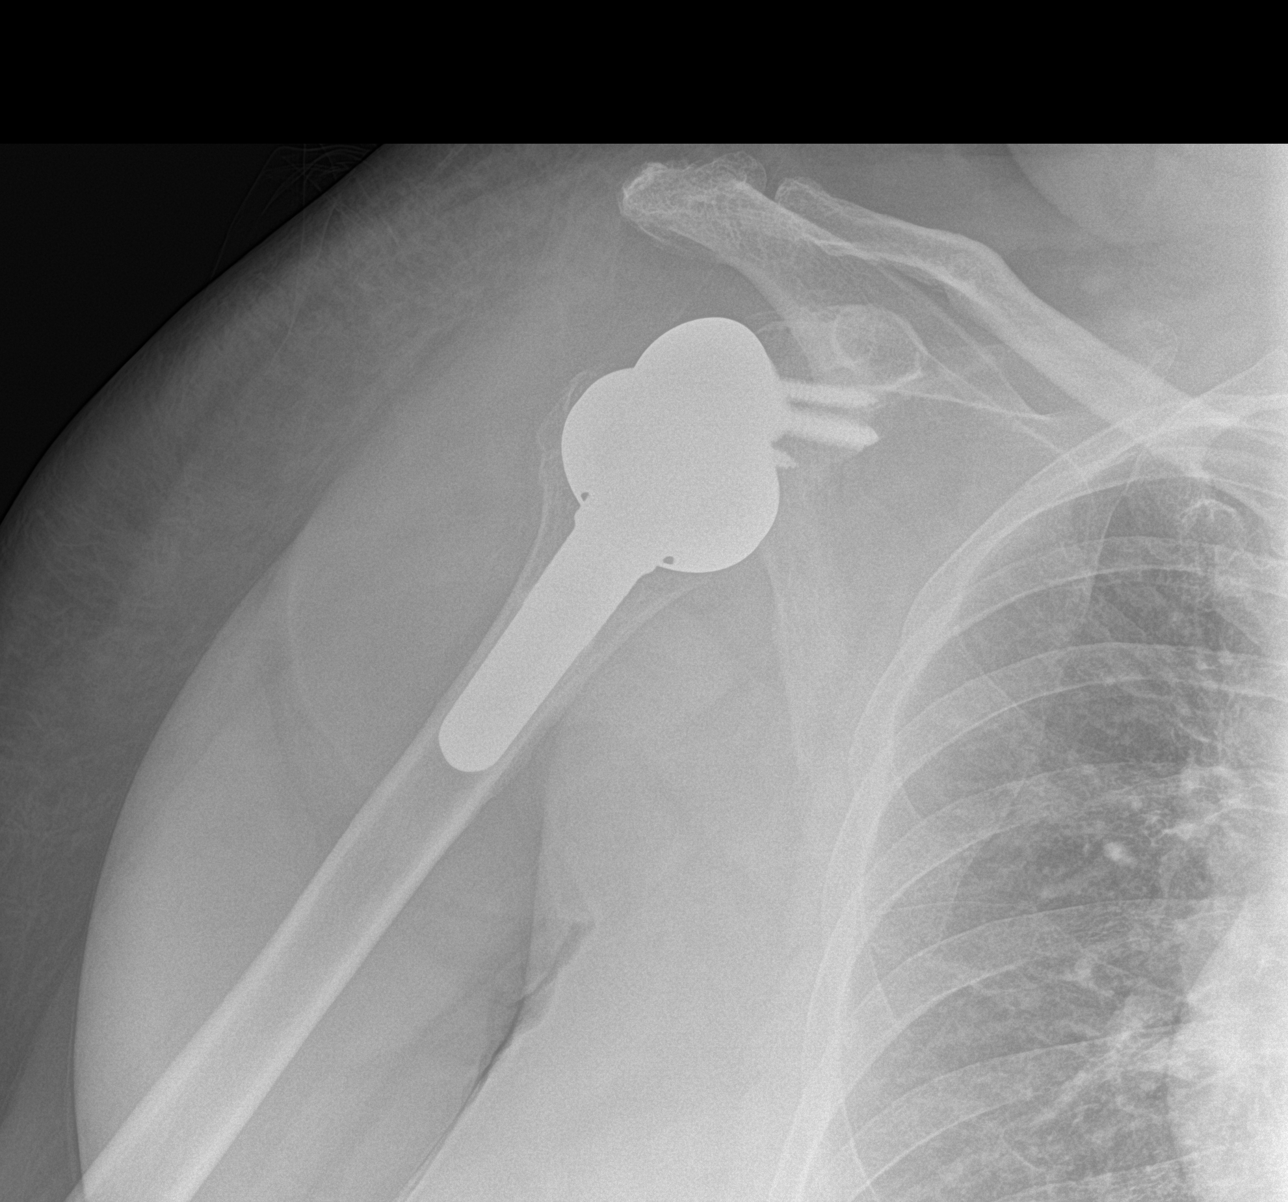

[shoulder obl]
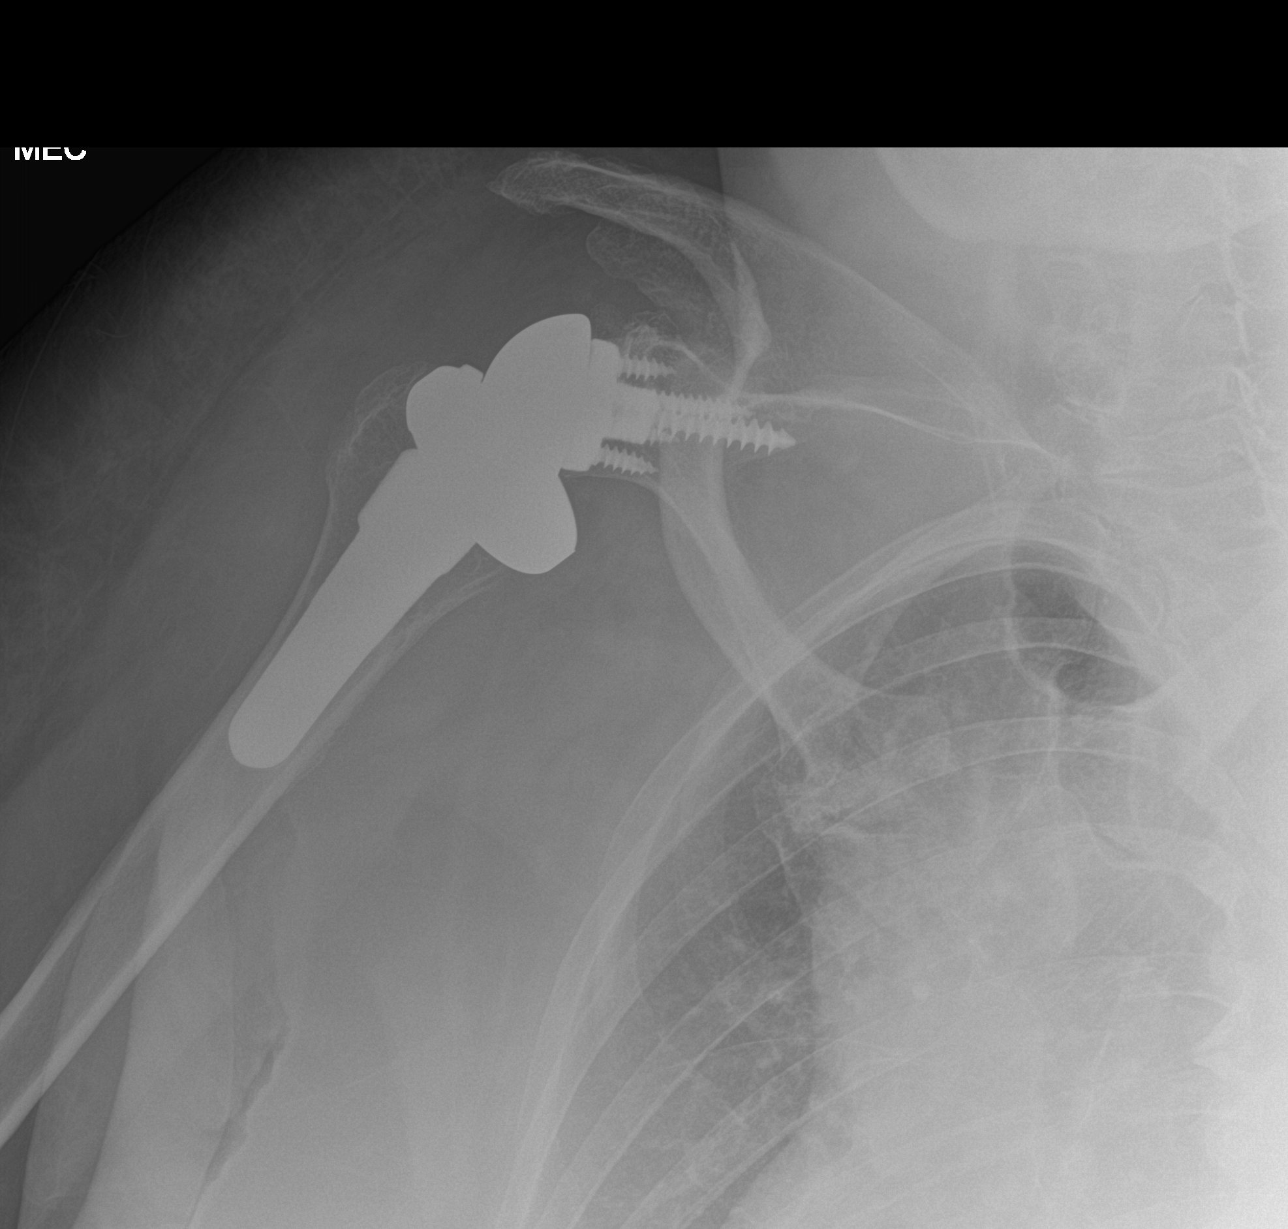

[3 of 3 positions shown; findings below may reference images not displayed]

FINDINGS: Reverse shoulder arthroplasty. No evidence of fracture or
dislocation
IMPRESSION: .

1.  No fracture or dislocation.
2. Reversed shoulder arthroplasty without complication

## 2020-11-28 ENCOUNTER — Telehealth: Payer: Self-pay | Admitting: *Deleted

## 2020-11-28 NOTE — Telephone Encounter (Signed)
Left Message for patient to return my call concerning labs.

## 2020-11-28 NOTE — Telephone Encounter (Signed)
-----   Message from [name], MD sent at 11/25/2020  5:10 PM EDT ----- Continue the metolazone.  A new rx for torsemide 10 mg has been sent to your pharmacy  Regards,   Deborra Medina, MD

## 2020-11-28 NOTE — Telephone Encounter (Signed)
See result note patient aware lab scheduled.

## 2020-11-29 ENCOUNTER — Telehealth: Payer: Self-pay | Admitting: Internal Medicine

## 2020-11-29 MED ORDER — ATORVASTATIN CALCIUM 40 MG PO TABS: 40.0000 mg | ORAL_TABLET | Freq: Every day | ORAL | 0 refills | Status: DC

## 2020-11-29 NOTE — Telephone Encounter (Signed)
Patient needs a refill on her atorvastatin (LIPITOR) 40 MG tablet through her mail order pharmacy.

## 2020-12-05 ENCOUNTER — Other Ambulatory Visit: Payer: Medicare Other

## 2020-12-06 ENCOUNTER — Other Ambulatory Visit: Payer: Self-pay

## 2020-12-06 ENCOUNTER — Ambulatory Visit: Payer: Medicare Other | Admitting: Pharmacist

## 2020-12-06 ENCOUNTER — Other Ambulatory Visit (INDEPENDENT_AMBULATORY_CARE_PROVIDER_SITE_OTHER): Payer: Medicare Other

## 2020-12-06 DIAGNOSIS — N1832 Chronic kidney disease, stage 3b: Secondary | ICD-10-CM

## 2020-12-06 DIAGNOSIS — G2581 Restless legs syndrome: Secondary | ICD-10-CM

## 2020-12-06 DIAGNOSIS — E1140 Type 2 diabetes mellitus with diabetic neuropathy, unspecified: Secondary | ICD-10-CM

## 2020-12-06 DIAGNOSIS — N179 Acute kidney failure, unspecified: Secondary | ICD-10-CM

## 2020-12-06 DIAGNOSIS — I10 Essential (primary) hypertension: Secondary | ICD-10-CM

## 2020-12-06 DIAGNOSIS — I5032 Chronic diastolic (congestive) heart failure: Secondary | ICD-10-CM

## 2020-12-06 NOTE — Chronic Care Management (AMB) (Signed)
Chronic Care Management Pharmacy Note  12/06/2020 Name:  Denise Sharp MRN:  [id number] DOB:  1945/11/04  Subjective: Denise Sharp is an 75 y.o. year old female who is a primary patient of [name], Aris Everts, MD.  The CCM team was consulted for assistance with disease management and care coordination needs.    Engaged with patient face to face for device access concerns in response to provider referral for pharmacy case management and/or care coordination services.   Consent to Services:  The patient was given information about Chronic Care Management services, agreed to services, and gave verbal consent prior to initiation of services.  Please see initial visit note for detailed documentation.   Patient Care Team: [name], MD as PCP - General (Internal Medicine) End, Harrell Gave, MD as PCP - Cardiology (Cardiology) Chiropractic, Freddi Che as Referring Physician De Hollingshead, RPH-CPP (Pharmacist) Leona Singleton, RN as Case Manager  Recent office visits:  5/13 - RM CM call  Recent consult visits: None since our last call  Hospital visits: None in previous 6 months  Objective:  Lab Results  Component Value Date   CREATININE 1.83 (H) 11/22/2020   CREATININE 1.48 (H) 11/02/2020   CREATININE 1.50 (H) 09/28/2020    Lab Results  Component Value Date   HGBA1C 8.2 (A) 11/15/2020   Last diabetic Eye exam:  Lab Results  Component Value Date/Time   HMDIABEYEEXA No Retinopathy 10/15/2016 12:00 AM    Last diabetic Foot exam:  Lab Results  Component Value Date/Time   HMDIABFOOTEX abnormal 06/23/2014 12:00 AM        Component Value Date/Time   CHOL 134 05/24/2020 1532   CHOL 155 02/27/2014 0420   TRIG 128.0 05/24/2020 1532   TRIG 121 02/27/2014 0420   HDL 60.40 05/24/2020 1532   HDL 43 02/27/2014 0420   CHOLHDL 2 05/24/2020 1532   VLDL 25.6 05/24/2020 1532   VLDL 24 02/27/2014 0420   LDLCALC 48 05/24/2020 1532   LDLCALC 88 02/27/2014 0420    LDLDIRECT 111.0 06/16/2015 0936    Hepatic Function Latest Ref Rng & Units 07/17/2020 06/07/2020 06/07/2020  Total Protein 6.5 - 8.1 g/dL 7.6 - 6.9  Albumin 3.5 - 5.0 g/dL 3.5 - 3.6  AST 15 - 41 U/L 18 - 12  ALT 0 - 44 U/L 12 - 9  Alk Phosphatase 38 - 126 U/L 129(H) 153(H) 134(H)  Total Bilirubin 0.3 - 1.2 mg/dL 0.5 - 0.3  Bilirubin, Direct 0.0 - 0.3 mg/dL - - 0.1    Lab Results  Component Value Date/Time   TSH 3.695 07/17/2020 11:50 AM   TSH 3.50 01/01/2019 01:45 PM   TSH 2.279 08/20/2017 05:01 AM   TSH 4.400 06/16/2015 09:36 AM   FREET4 1.10 05/03/2015 04:20 PM    CBC Latest Ref Rng & Units 08/31/2020 07/17/2020 06/07/2020  WBC 4.0 - 10.5 K/uL 9.7 10.5 11.1(H)  Hemoglobin 12.0 - 15.0 g/dL 13.7 11.4(L) 10.1(L)  Hematocrit 36.0 - 46.0 % 41.2 38.6 32.8(L)  Platelets 150 - 400 K/uL 277 354 420.0(H)    Lab Results  Component Value Date/Time   VD25OH 21.65 (L) 06/04/2017 02:12 PM   VD25OH 23.44 (L) 11/19/2016 02:31 PM    Clinical ASCVD: Yes  The 10-year ASCVD risk score Mikey Bussing DC Jr., et al., 2013) is: 37.4%   Values used to calculate the score:     Age: 41 years     Sex: Female     Is Non-Hispanic African American:  No     Diabetic: Yes     Tobacco smoker: Yes     Systolic Blood Pressure: 417 mmHg     Is BP treated: Yes     HDL Cholesterol: 60.4 mg/dL     Total Cholesterol: 134 mg/dL      Social History   Tobacco Use  Smoking Status Never Smoker  Smokeless Tobacco Never Used   BP Readings from Last 3 Encounters:  11/20/20 (!) 109/50  11/15/20 (!) 114/58  11/02/20 110/70   Pulse Readings from Last 3 Encounters:  11/20/20 78  11/15/20 93  11/02/20 77   Wt Readings from Last 3 Encounters:  11/20/20 244 lb (110.7 kg)  11/15/20 251 lb 6.4 oz (114 kg)  11/02/20 244 lb (110.7 kg)    Assessment: Review of patient past medical history, allergies, medications, health status, including review of consultants reports, laboratory and other test data, was performed as  part of comprehensive evaluation and provision of chronic care management services.   SDOH:  (Social Determinants of Health) assessments and interventions performed:none today    CCM Care Plan  Allergies  Allergen Reactions  . Demeclocycline Hives  . Erythromycin Nausea And Vomiting and Other (See Comments)    Severe irritable bowel  . Flagyl [Metronidazole] Nausea And Vomiting and Other (See Comments)    Severe irritable bowel  . Glucophage [Metformin Hcl] Nausea And Vomiting and Other (See Comments)    "Sick" "I won't take anything that has metformin in it"  . Tetracyclines & Related Hives and Rash  . Diovan [Valsartan] Nausea Only       . Sulfa Antibiotics Rash and Other (See Comments)    As child  . Xanax [Alprazolam] Other (See Comments)    Unknown reaction    Medications Reviewed Today    Reviewed by Leona Singleton, RN (Registered Nurse) on 11/25/20 at 1208  Med List Status: <None>  Medication Order Taking? Sig Documenting Provider Last Dose Status Informant  albuterol (VENTOLIN HFA) 108 (90 Base) MCG/ACT inhaler [id number]  Inhale 2 puffs into the lungs every 8 (eight) hours as needed for wheezing or shortness of breath. [name], MD  Active Self  aspirin EC 81 MG tablet [id number]  Take 1 tablet (81 mg total) by mouth daily. Fritzi Mandes, MD  Active Self  atorvastatin (LIPITOR) 40 MG tablet [id number]  Take 1 tablet (40 mg total) by mouth daily. Rise Mu, PA-C  Active Self  buPROPion [facility]) 75 MG tablet [id number]  Take 1 tablet (75 mg total) by mouth 2 (two) times daily. [name], MD  Active   butalbital-acetaminophen-caffeine (FIORICET) 50-325-40 MG tablet [id number]  TAKE 1 TABLET BY MOUTH EVERY 6 HOURS AS NEEDED [name], MD  Active Self  citalopram (CELEXA) 10 MG tablet [id number]  Take 1 tablet (10 mg total) by mouth daily. [name], MD  Active   clopidogrel (PLAVIX) 75 MG tablet [id number]  Take 1 tablet (75 mg total) by mouth daily.  Rise Mu, PA-C  Active Self  clotrimazole-betamethasone Donalynn Furlong) cream [id number]  Apply 1 application topically 2 (two) times daily as needed. [provider]  Active Self  Continuous Blood Gluc Receiver (FREESTYLE LIBRE 2 READER) DEVI [id number]  Use to check sugar at least TID [name], MD  Active   cyanocobalamin (,VITAMIN B-12,) 1000 MCG/ML injection [id number]  Inject 1 mL (1,000 mcg total) into the muscle every 30 (thirty) days. Inject 1 ml (1000 mcg ) IM  weekly x 4,  Then monthly thereafter [name], MD  Active Self  dapagliflozin propanediol (FARXIGA) 10 MG TABS tablet [id number] Yes Take 1 tablet (10 mg total) by mouth daily before breakfast. [name], MD Taking Active   diazepam (VALIUM) 5 MG tablet [id number]  Take 1 tablet (5 mg total) by mouth at bedtime as needed for anxiety.  Patient taking differently: Take 5 mg by mouth at bedtime.   [name], MD  Active Self  diphenhydrAMINE (BENADRYL) 25 MG tablet [id number]  Take 50 mg by mouth daily as needed for allergies. [provider]  Active   diphenoxylate-atropine (LOMOTIL) 2.5-0.025 MG tablet [id number]  Take 1 tablet by mouth 4 (four) times daily as needed for diarrhea or loose stools. [name], MD  Active   Eszopiclone 3 MG TABS [id number]  Take 1 tablet (3 mg total) by mouth at bedtime. [name], MD  Active   fexofenadine (ALLEGRA) 180 MG tablet [id number]  Take 180 mg by mouth daily. [provider]  Active   hyoscyamine (ANASPAZ) 0.125 MG TBDP disintergrating tablet [id number]  DISSOLVE UNDER TONGUE 15 MINUTES BEFORE MEALS [name], MD  Active Self  insulin aspart (NOVOLOG FLEXPEN) 100 UNIT/ML FlexPen [id number] Yes Inject 8 units up to 3 times daily before meals [name], MD Taking Active Self           Med Note [facility], BRANDY L   Fri Sep 09, 2020  2:00 PM)    insulin glargine (LANTUS SOLOSTAR) 100 UNIT/ML Solostar Pen [id number] Yes Inject 15  Units into the skin daily. [name], MD Taking Active   Insulin Pen Needle (DROPLET PEN NEEDLES) 31G X 5 MM MISC [id number]  USE ONE NEEDLE SUBCUTANEOUSLY AS DIRECTED (REMOVE AND DISCARD NEEDLE IN SHARPS CONTAINER IMMEDIATELY AFTER USE) [name], MD  Active Self  ipratropium (ATROVENT) 0.06 % nasal spray [id number]  Place 2 sprays into both nostrils 4 (four) times daily as needed for rhinitis. [provider]  Active Self  loperamide (IMODIUM) 2 MG capsule [id number]  Take by mouth as needed. [provider]  Active Self  metolazone (ZAROXOLYN) 2.5 MG tablet [id number]  Take 1 tablet (2.5 mg total) by mouth daily. 30 minutes before torsemide dose [name], MD  Active   metoprolol tartrate (LOPRESSOR) 25 MG tablet [id number]  Take 0.5 tablets (12.5 mg total) by mouth 2 (two) times daily. Theora Gianotti, NP  Active            Med Note [facility]   Fri Nov 25, 2020 12:07 PM) Picking up from pharmacy  metoprolol tartrate (LOPRESSOR) 25 MG tablet [id number]  Take 0.5 tablets (12.5 mg total) by mouth 2 (two) times daily. Theora Gianotti, NP  Active   nitroGLYCERIN (NITROSTAT) 0.4 MG SL tablet [id number]  Place 1 tablet (0.4 mg total) under the tongue every 5 (five) minutes as needed for chest pain. Theora Gianotti, NP  Active   NONFORMULARY OR COMPOUNDED [place] [id number]  [place]  Combination Pain Cream -  Baclofen 2%, Doxepin 5%, Gabapentin 6%, Topiramate 2%, Pentoxifylline 3% Apply 1-2 grams to affected area 3-4 times daily Qty. 120 gm 3 refills Gardiner Barefoot, DPM  Active Self           Med Note [facility], BRANDY L   Fri Sep 02, 2020  2:40 PM)    nystatin (MYCOSTATIN/NYSTOP) powder [id number]  Apply 1 g topically  4 (four) times daily as needed (rash). [provider]  Active   potassium chloride SA (KLOR-CON) 20 MEQ tablet [id number]  Take 1 tablet (20 mEq total) by mouth daily. End, Harrell Gave, MD  Active  Self  pregabalin (LYRICA) 50 MG capsule [id number]  Take 1 capsule (50 mg total) by mouth 2 (two) times daily. Kathrynn Ducking, MD  Active   torsemide (DEMADEX) 20 MG tablet [id number] Yes Take 1 tablet (20 mg total) by mouth daily. [name], MD Taking Active            Med Note [facility] D   Fri Nov 25, 2020 11:56 AM) Reports was told to take 10 mg daily  traMADol (ULTRAM) 50 MG tablet [id number]  Take 0.5 tablets (25 mg total) by mouth every 12 (twelve) hours as needed for severe pain. Elmarie Shiley, MD  Active            Med Note [facility], BRANDY L   Fri Sep 02, 2020  2:40 PM)    umeclidinium-vilanterol [facility] ELLIPTA) 62.5-25 MCG/INH AEPB [id number]  Inhale 1 puff into the lungs daily. [name], MD  Active Self  Med List Note Jaymes Graff 07/22/17 1307): CPAP          Patient Active Problem List   Diagnosis Date Noted  . Temporal arteritis syndrome ([place]) 11/17/2020  . Other pulmonary embolism with acute cor pulmonale, unspecified chronicity ([place]) 11/17/2020  . Mild episode of recurrent major depressive disorder ([place]) 11/17/2020  . Hyperlipidemia associated with type 2 diabetes mellitus ([place]) 08/21/2020  . Iron deficiency 06/28/2020  . Skin lesion of chest wall 06/10/2020  . Elevated alkaline phosphatase level 06/10/2020  . Coagulation defect ([place]) 06/06/2020  . Chronic heart failure with preserved ejection fraction (HFpEF) ([place]) 01/14/2020  . Hyperlipidemia LDL goal <70 01/14/2020  . Coronary artery disease involving native coronary artery of native heart without angina pectoris 01/13/2020  . S/P cardiac catheterization 01/13/2020  . Accelerating angina ([place]) 12/31/2019  . Dysphagia 11/30/2019  . Bladder pain 08/23/2019  . Gastritis 08/07/2019  . Lab test negative for COVID-19 virus 08/07/2019  . S/P panniculectomy 06/16/2019  . Major depressive disorder with current active episode 05/30/2019  . Exposure to COVID-19 virus  12/25/2018  . Functional diarrhea 10/22/2018  . Bilateral cataracts 06/26/2018  . Status post bariatric surgery 02/05/2018  . GAD (generalized anxiety disorder) 02/01/2018  . Rectocele 01/24/2018  . Vaginal prolapse 01/04/2018  . Hepatic steatosis 01/04/2018  . Microalbuminuria due to type 2 diabetes mellitus ([place]) 11/21/2017  . Hospital discharge follow-up 09/22/2017  . Hypocalcemia 09/22/2017  . Hypotension 09/22/2017  . Fecal incontinence 09/22/2017  . B12 deficiency 09/22/2017  . Transient disorientation 08/20/2017  . Rotator cuff arthropathy, right 08/13/2017  . Primary localized osteoarthrosis of shoulder 08/13/2017  . Encounter for preoperative examination for general surgical procedure 08/03/2017  . 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 Candidiasis, intertrigo 08/03/2017  . 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 Edema 12/13/2015  . Hypersomnia, persistent 06/18/2015  . Mechanical breakdown of implanted electronic neurostimulator of peripheral nerve ([place]) 10/16/2014  . Obesity hypoventilation syndrome ([place]) 08/13/2014  . Frequent falls 06/23/2014  . Chronic cough 05/05/2014  . Adenoma of left adrenal gland 03/24/2014  . New onset of headaches after age 42 03/23/2014  . Vitamin D deficiency 01/06/2014  . Weight gain following gastric bypass surgery 12/03/2013  . Morbid obesity ([place]) 08/25/2013  .  Diaphragmatic hernia 04/06/2013  . Diabetic polyneuropathy ([place]) 03/25/2013  . Abnormality of gait 03/25/2013  . Multiple pulmonary nodules 03/20/2013  . DOE (dyspnea on exertion) 03/20/2013  . Iron deficiency anemia 08/06/2012  . Functional disorder of bladder 07/15/2012  . Incomplete bladder emptying 07/15/2012  . 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 Urge incontinence 07/15/2012  . Increased frequency of urination 07/15/2012  . OSA on CPAP 03/02/2012  . Insomnia secondary to anxiety 02/29/2012  .  Sciatica 09/05/2011  . Cervical stenosis of spinal canal 06/14/2011  . Restless legs syndrome 03/13/2011  . Essential hypertension 03/12/2011  . Type 2 diabetes mellitus with diabetic neuropathy, unspecified ([place]) 03/12/2011  . Hearing loss, right 03/12/2011    Immunization History  Administered Date(s) Administered  . Fluad Quad(high Dose 65+) 03/06/2019, 06/07/2020  . Influenza Split 06/13/2011  . Influenza, High Dose Seasonal PF 08/21/2018  . Influenza,inj,Quad PF,6+ Mos 05/04/2014, 05/12/2015  . Influenza-Unspecified 04/15/2012  . Moderna Sars-Covid-2 Vaccination 02/02/2020, 03/01/2020, 09/01/2020  . Pneumococcal Conjugate-13 08/11/2013, 01/06/2014  . Pneumococcal Polysaccharide-23 01/19/2010, 09/03/2018  . Tdap 04/18/2016, 11/20/2020    Conditions to be addressed/monitored: CHF, CAD, HTN, HLD and DMII  Care Plan : Medication Management  Updates made by De Hollingshead, RPH-CPP since 12/06/2020 12:00 AM    Problem: Diabetes, CHF     Long-Range Goal: Disease Progression Prevention   This Visit's Progress: On track  Recent Progress: On track  Priority: High  Note:   Current Barriers:  . Unable to achieve control of diabetes   Pharmacist Clinical Goal(s):  [name] Over the next 90 days, patient will achieve control of diabetes as evidenced by improvement in A1c through collaboration with PharmD and provider  Interventions: . 1:1 collaboration with [name], MD regarding development and update of comprehensive plan of care as evidenced by provider attestation and co-signature . Inter-disciplinary care team collaboration (see longitudinal plan of care) . Comprehensive medication review performed; medication list updated in electronic medical record  Diabetes: . Uncontrolled; current treatment: Farxiga 10 mg daily, Lantus 15 units QAM, Novolog up to 8 units up to TID with meals  o Hx intolerance to metformin d/t diarrhea o Hx Trulicity - exacerbated diarrhea  o Hx  glipizide - stopped previously when A1c was well controlled  o Hx Januvia - stopped previously when Trulicity was started  o Avoiding TZD due to HF . Notes today that she is having a difficult time communicating with [facility] to have next shipment authorized. They needed the date that services were ended w/her prior supplier. I provided this, they will re-process. I will call back tomorrow to f/u.   HFpEF: . Moderately well controlled; current regimen: torsemide 10 mg daily, potassium 20 mEq daily, metolazone 2.5 mg daily . Continue current regimen at this time. BMP collected today.   Hyperlipidemia, secondary ASCVD prevention: . Controlled per last lab; current treatment: atorvastatin 40 mg daily  . Most recent LHC showed nonobstructive CAD w/ patent stenting.  . Current antiplatelet regimen: aspirin 81 mg daily, clopidogrel 75 mg daily - plan for DAPT for full 12 months s/p stenting (12/2020) . Previously recommended to continue current regimen at this time along with cardiology collaboration.  Depression/Anxiety with Insomnia: . Moderately well managed. Current treatment: citalopram 10 mg daily, bupropion 150 mg QAM, 75 mg QPM, diazepam 5 mg QPM, Lunesta 3 mg QPM . Continue to monitor for increased sedation w/ multiple CNS sedating medications. Previously recommended to continue current regimen at this time  Chronic  Diarrhea, S/p Gastric Bypass, Esophageal "growth" . Moderately well managed, given patient history. Current regimen: dicyclomine 10 mg QID PRN, hyoscyamine 10 mg QID PRN . Notes that she needs her esophagus stretched, but is awaiting cardiology clearance to be able to hold clopidogrel. . Previously recommended to continue current regimen at this time  Nocturnal "leg cramps", as well as chronic pain . More exacerbated recently per patient report. Current regimen: pregabalin 100 mg QPM PRN;  . Previously recommended to continue current regimen at this time. Monitor for  increased sedation with fluctuations in renal function  COPD:  . Well controlled; Current regimen: Anoro 62.5/25 mcg daily, albuterol HFA PRN, notes overall improvement in SOB recently . Previously recommended to continue current regimen at this time.   Patient Goals/Self-Care Activities . Over the next 90 days, patient will:  - take medications as prescribed check blood glucose at least three times daily using CGM, document, and provide at future appointments  Follow Up Plan: Face to Face appointment with care management team member scheduled for:  ~ 3 weeks as previously scheduled      Medication Assistance: None required.  Patient affirms current coverage meets needs.  Patient's preferred pharmacy is:  [name]: [phone]: [phone] [facility] [place] [address] [place] [address]Phone: [phone]: [phone] [facility] [place] [address]Phone: [phone]: [phone] Follow Up:  Patient agrees to Care Plan and Follow-up.  Plan: Face to Face appointment with care management team member scheduled for: ~ 3 weeks  Catie TDarnelle Maffucci PharmD, BChamita CCressonClinical Pharmacist LOccidental Petroleumat BJohnson [employer] [phone]

## 2020-12-06 NOTE — Patient Instructions (Signed)
Visit Information  PATIENT GOALS: Goals Addressed              This Visit's Progress     Patient Stated   .  Medication Monitoring (pt-stated)        Patient Goals/Self-Care Activities . Over the next 90 days, patient will:  - take medications as prescribed check blood glucose at least three times daily using CGM, document, and provide at future appointments       Patient verbalizes understanding of instructions provided today and agrees to view in [place].   Plan: Face to Face appointment with care management team member scheduled for: ~ 3 weeks  Catie Darnelle Maffucci, PharmD, [place], [place] Clinical Pharmacist [employer] [facility] [employer] [phone]

## 2020-12-07 ENCOUNTER — Ambulatory Visit: Payer: Medicare Other | Admitting: Pharmacist

## 2020-12-07 DIAGNOSIS — E1140 Type 2 diabetes mellitus with diabetic neuropathy, unspecified: Secondary | ICD-10-CM

## 2020-12-07 DIAGNOSIS — I5032 Chronic diastolic (congestive) heart failure: Secondary | ICD-10-CM

## 2020-12-07 DIAGNOSIS — N1832 Chronic kidney disease, stage 3b: Secondary | ICD-10-CM

## 2020-12-07 DIAGNOSIS — I10 Essential (primary) hypertension: Secondary | ICD-10-CM

## 2020-12-07 LAB — BASIC METABOLIC PANEL
BUN: 55 mg/dL — ABNORMAL HIGH (ref 6–23)
CO2: 27 mEq/L (ref 19–32)
Calcium: 9.4 mg/dL (ref 8.4–10.5)
Chloride: 93 mEq/L — ABNORMAL LOW (ref 96–112)
Creatinine, Ser: 1.79 mg/dL — ABNORMAL HIGH (ref 0.40–1.20)
GFR: 27.46 mL/min — ABNORMAL LOW (ref >60.00)
Glucose, Bld: 357 mg/dL — ABNORMAL HIGH (ref 70–99)
Potassium: 3.5 mEq/L (ref 3.5–5.1)
Sodium: 137 mEq/L (ref 135–145)

## 2020-12-07 NOTE — Chronic Care Management (AMB) (Signed)
Chronic Care Management Pharmacy Note  12/07/2020 Name:  Denise Sharp MRN:  [id number] DOB:  1945/07/18  Subjective: Denise Sharp is an 75 y.o. year old female who is a primary patient of [name], Aris Everts, MD.  The CCM team was consulted for assistance with disease management and care coordination needs.    Engaged with patient by telephone for device access follow up in response to provider referral for pharmacy case management and/or care coordination services.   Consent to Services:  The patient was given information about Chronic Care Management services, agreed to services, and gave verbal consent prior to initiation of services.  Please see initial visit note for detailed documentation.   Patient Care Team: [name], MD as PCP - General (Internal Medicine) End, Harrell Gave, MD as PCP - Cardiology (Cardiology) Chiropractic, Freddi Che as Referring Physician De Hollingshead, RPH-CPP (Pharmacist) Leona Singleton, RN as Case Manager  Objective:  Lab Results  Component Value Date   CREATININE 1.79 (H) 12/06/2020   CREATININE 1.83 (H) 11/22/2020   CREATININE 1.48 (H) 11/02/2020    Lab Results  Component Value Date   HGBA1C 8.2 (A) 11/15/2020   Last diabetic Eye exam:  Lab Results  Component Value Date/Time   HMDIABEYEEXA No Retinopathy 10/15/2016 12:00 AM    Last diabetic Foot exam:  Lab Results  Component Value Date/Time   HMDIABFOOTEX abnormal 06/23/2014 12:00 AM        Component Value Date/Time   CHOL 134 05/24/2020 1532   CHOL 155 02/27/2014 0420   TRIG 128.0 05/24/2020 1532   TRIG 121 02/27/2014 0420   HDL 60.40 05/24/2020 1532   HDL 43 02/27/2014 0420   CHOLHDL 2 05/24/2020 1532   VLDL 25.6 05/24/2020 1532   VLDL 24 02/27/2014 0420   LDLCALC 48 05/24/2020 1532   LDLCALC 88 02/27/2014 0420   LDLDIRECT 111.0 06/16/2015 0936    Hepatic Function Latest Ref Rng & Units 07/17/2020 06/07/2020 06/07/2020  Total Protein 6.5 - 8.1 g/dL 7.6  - 6.9  Albumin 3.5 - 5.0 g/dL 3.5 - 3.6  AST 15 - 41 U/L 18 - 12  ALT 0 - 44 U/L 12 - 9  Alk Phosphatase 38 - 126 U/L 129(H) 153(H) 134(H)  Total Bilirubin 0.3 - 1.2 mg/dL 0.5 - 0.3  Bilirubin, Direct 0.0 - 0.3 mg/dL - - 0.1    Lab Results  Component Value Date/Time   TSH 3.695 07/17/2020 11:50 AM   TSH 3.50 01/01/2019 01:45 PM   TSH 2.279 08/20/2017 05:01 AM   TSH 4.400 06/16/2015 09:36 AM   FREET4 1.10 05/03/2015 04:20 PM    CBC Latest Ref Rng & Units 08/31/2020 07/17/2020 06/07/2020  WBC 4.0 - 10.5 K/uL 9.7 10.5 11.1(H)  Hemoglobin 12.0 - 15.0 g/dL 13.7 11.4(L) 10.1(L)  Hematocrit 36.0 - 46.0 % 41.2 38.6 32.8(L)  Platelets 150 - 400 K/uL 277 354 420.0(H)    Lab Results  Component Value Date/Time   VD25OH 21.65 (L) 06/04/2017 02:12 PM   VD25OH 23.44 (L) 11/19/2016 02:31 PM    Clinical ASCVD: Yes  The 10-year ASCVD risk score Mikey Bussing DC Jr., et al., 2013) is: 37.4%   Values used to calculate the score:     Age: 84 years     Sex: Female     Is Non-Hispanic African American: No     Diabetic: Yes     Tobacco smoker: Yes     Systolic Blood Pressure: 283 mmHg  Is BP treated: Yes     HDL Cholesterol: 60.4 mg/dL     Total Cholesterol: 134 mg/dL    Other: (CHADS2VASc if Afib, PHQ9 if depression, MMRC or CAT for COPD, ACT, DEXA)  Social History   Tobacco Use  Smoking Status Never Smoker  Smokeless Tobacco Never Used   BP Readings from Last 3 Encounters:  11/20/20 (!) 109/50  11/15/20 (!) 114/58  11/02/20 110/70   Pulse Readings from Last 3 Encounters:  11/20/20 78  11/15/20 93  11/02/20 77   Wt Readings from Last 3 Encounters:  11/20/20 244 lb (110.7 kg)  11/15/20 251 lb 6.4 oz (114 kg)  11/02/20 244 lb (110.7 kg)    Assessment: Review of patient past medical history, allergies, medications, health status, including review of consultants reports, laboratory and other test data, was performed as part of comprehensive evaluation and provision of chronic care  management services.   SDOH:  (Social Determinants of Health) assessments and interventions performed:    CCM Care Plan  Allergies  Allergen Reactions  . Demeclocycline Hives  . Erythromycin Nausea And Vomiting and Other (See Comments)    Severe irritable bowel  . Flagyl [Metronidazole] Nausea And Vomiting and Other (See Comments)    Severe irritable bowel  . Glucophage [Metformin Hcl] Nausea And Vomiting and Other (See Comments)    "Sick" "I won't take anything that has metformin in it"  . Tetracyclines & Related Hives and Rash  . Diovan [Valsartan] Nausea Only       . Sulfa Antibiotics Rash and Other (See Comments)    As child  . Xanax [Alprazolam] Other (See Comments)    Unknown reaction    Medications Reviewed Today    Reviewed by Leona Singleton, RN (Registered Nurse) on 11/25/20 at 1208  Med List Status: <None>  Medication Order Taking? Sig Documenting Provider Last Dose Status Informant  albuterol (VENTOLIN HFA) 108 (90 Base) MCG/ACT inhaler [id number]  Inhale 2 puffs into the lungs every 8 (eight) hours as needed for wheezing or shortness of breath. [name], MD  Active Self  aspirin EC 81 MG tablet [id number]  Take 1 tablet (81 mg total) by mouth daily. Fritzi Mandes, MD  Active Self  atorvastatin (LIPITOR) 40 MG tablet [id number]  Take 1 tablet (40 mg total) by mouth daily. Rise Mu, PA-C  Active Self  buPROPion [facility]) 75 MG tablet [id number]  Take 1 tablet (75 mg total) by mouth 2 (two) times daily. [name], MD  Active   butalbital-acetaminophen-caffeine (FIORICET) 50-325-40 MG tablet [id number]  TAKE 1 TABLET BY MOUTH EVERY 6 HOURS AS NEEDED [name], MD  Active Self  citalopram (CELEXA) 10 MG tablet [id number]  Take 1 tablet (10 mg total) by mouth daily. [name], MD  Active   clopidogrel (PLAVIX) 75 MG tablet [id number]  Take 1 tablet (75 mg total) by mouth daily. Rise Mu, PA-C  Active Self  clotrimazole-betamethasone Donalynn Furlong)  cream [id number]  Apply 1 application topically 2 (two) times daily as needed. [provider]  Active Self  Continuous Blood Gluc Receiver (FREESTYLE LIBRE 2 READER) DEVI [id number]  Use to check sugar at least TID [name], MD  Active   cyanocobalamin (,VITAMIN B-12,) 1000 MCG/ML injection [id number]  Inject 1 mL (1,000 mcg total) into the muscle every 30 (thirty) days. Inject 1 ml (1000 mcg ) IM weekly x 4,  Then monthly thereafter [name], MD  Active Self  dapagliflozin propanediol (FARXIGA) 10 MG TABS tablet [id number] Yes Take 1 tablet (10 mg total) by mouth daily before breakfast. [name], MD Taking Active   diazepam (VALIUM) 5 MG tablet [id number]  Take 1 tablet (5 mg total) by mouth at bedtime as needed for anxiety.  Patient taking differently: Take 5 mg by mouth at bedtime.   [name], MD  Active Self  diphenhydrAMINE (BENADRYL) 25 MG tablet [id number]  Take 50 mg by mouth daily as needed for allergies. [provider]  Active   diphenoxylate-atropine (LOMOTIL) 2.5-0.025 MG tablet [id number]  Take 1 tablet by mouth 4 (four) times daily as needed for diarrhea or loose stools. [name], MD  Active   Eszopiclone 3 MG TABS [id number]  Take 1 tablet (3 mg total) by mouth at bedtime. [name], MD  Active   fexofenadine (ALLEGRA) 180 MG tablet [id number]  Take 180 mg by mouth daily. [provider]  Active   hyoscyamine (ANASPAZ) 0.125 MG TBDP disintergrating tablet [id number]  DISSOLVE UNDER TONGUE 15 MINUTES BEFORE MEALS [name], MD  Active Self  insulin aspart (NOVOLOG FLEXPEN) 100 UNIT/ML FlexPen [id number] Yes Inject 8 units up to 3 times daily before meals [name], MD Taking Active Self           Med Note [facility], BRANDY L   Fri Sep 09, 2020  2:00 PM)    insulin glargine (LANTUS SOLOSTAR) 100 UNIT/ML Solostar Pen [id number] Yes Inject 15 Units into the skin daily. [name], MD Taking Active   Insulin  Pen Needle (DROPLET PEN NEEDLES) 31G X 5 MM MISC [id number]  USE ONE NEEDLE SUBCUTANEOUSLY AS DIRECTED (REMOVE AND DISCARD NEEDLE IN SHARPS CONTAINER IMMEDIATELY AFTER USE) [name], MD  Active Self  ipratropium (ATROVENT) 0.06 % nasal spray [id number]  Place 2 sprays into both nostrils 4 (four) times daily as needed for rhinitis. [provider]  Active Self  loperamide (IMODIUM) 2 MG capsule [id number]  Take by mouth as needed. [provider]  Active Self  metolazone (ZAROXOLYN) 2.5 MG tablet [id number]  Take 1 tablet (2.5 mg total) by mouth daily. 30 minutes before torsemide dose [name], MD  Active   metoprolol tartrate (LOPRESSOR) 25 MG tablet [id number]  Take 0.5 tablets (12.5 mg total) by mouth 2 (two) times daily. Theora Gianotti, NP  Active            Med Note [facility]   Fri Nov 25, 2020 12:07 PM) Picking up from pharmacy  metoprolol tartrate (LOPRESSOR) 25 MG tablet [id number]  Take 0.5 tablets (12.5 mg total) by mouth 2 (two) times daily. Theora Gianotti, NP  Active   nitroGLYCERIN (NITROSTAT) 0.4 MG SL tablet [id number]  Place 1 tablet (0.4 mg total) under the tongue every 5 (five) minutes as needed for chest pain. Theora Gianotti, NP  Active   NONFORMULARY OR COMPOUNDED [place] [id number]  [place]  Combination Pain Cream -  Baclofen 2%, Doxepin 5%, Gabapentin 6%, Topiramate 2%, Pentoxifylline 3% Apply 1-2 grams to affected area 3-4 times daily Qty. 120 gm 3 refills Gardiner Barefoot, DPM  Active Self           Med Note [facility], BRANDY L   Fri Sep 02, 2020  2:40 PM)    nystatin (MYCOSTATIN/NYSTOP) powder [id number]  Apply 1 g topically 4 (four) times daily as needed (rash). [provider]  Active   potassium  chloride SA (KLOR-CON) 20 MEQ tablet [id number]  Take 1 tablet (20 mEq total) by mouth daily. End, Harrell Gave, MD  Active Self  pregabalin (LYRICA) 50 MG capsule [id number]  Take 1 capsule (50  mg total) by mouth 2 (two) times daily. Kathrynn Ducking, MD  Active   torsemide (DEMADEX) 20 MG tablet [id number] Yes Take 1 tablet (20 mg total) by mouth daily. [name], MD Taking Active            Med Note [facility]   Fri Nov 25, 2020 11:56 AM) Reports was told to take 10 mg daily  traMADol (ULTRAM) 50 MG tablet [id number]  Take 0.5 tablets (25 mg total) by mouth every 12 (twelve) hours as needed for severe pain. Elmarie Shiley, MD  Active            Med Note [facility], BRANDY L   Fri Sep 02, 2020  2:40 PM)    umeclidinium-vilanterol [facility] ELLIPTA) 62.5-25 MCG/INH AEPB [id number]  Inhale 1 puff into the lungs daily. [name], MD  Active Self  Med List Note Jaymes Graff 07/22/17 1307): CPAP          Patient Active Problem List   Diagnosis Date Noted  . Temporal arteritis syndrome ([place]) 11/17/2020  . Other pulmonary embolism with acute cor pulmonale, unspecified chronicity ([place]) 11/17/2020  . Mild episode of recurrent major depressive disorder ([place]) 11/17/2020  . Hyperlipidemia associated with type 2 diabetes mellitus ([place]) 08/21/2020  . Iron deficiency 06/28/2020  . Skin lesion of chest wall 06/10/2020  . Elevated alkaline phosphatase level 06/10/2020  . Coagulation defect ([place]) 06/06/2020  . Chronic heart failure with preserved ejection fraction (HFpEF) ([place]) 01/14/2020  . Hyperlipidemia LDL goal <70 01/14/2020  . Coronary artery disease involving native coronary artery of native heart without angina pectoris 01/13/2020  . S/P cardiac catheterization 01/13/2020  . Accelerating angina ([place]) 12/31/2019  . Dysphagia 11/30/2019  . Bladder pain 08/23/2019  . Gastritis 08/07/2019  . Lab test negative for COVID-19 virus 08/07/2019  . S/P panniculectomy 06/16/2019  . Major depressive disorder with current active episode 05/30/2019  . Exposure to COVID-19 virus 12/25/2018  . Functional diarrhea 10/22/2018  . Bilateral cataracts  06/26/2018  . Status post bariatric surgery 02/05/2018  . GAD (generalized anxiety disorder) 02/01/2018  . Rectocele 01/24/2018  . Vaginal prolapse 01/04/2018  . Hepatic steatosis 01/04/2018  . Microalbuminuria due to type 2 diabetes mellitus ([place]) 11/21/2017  . Hospital discharge follow-up 09/22/2017  . Hypocalcemia 09/22/2017  . Hypotension 09/22/2017  . Fecal incontinence 09/22/2017  . B12 deficiency 09/22/2017  . Transient disorientation 08/20/2017  . Rotator cuff arthropathy, right 08/13/2017  . Primary localized osteoarthrosis of shoulder 08/13/2017  . Encounter for preoperative examination for general surgical procedure 08/03/2017  . 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 Candidiasis, intertrigo 08/03/2017  . 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 Edema 12/13/2015  . Hypersomnia, persistent 06/18/2015  . Mechanical breakdown of implanted electronic neurostimulator of peripheral nerve ([place]) 10/16/2014  . Obesity hypoventilation syndrome ( [place]) 08/13/2014  . Frequent falls 06/23/2014  . Chronic cough 05/05/2014  . Adenoma of left adrenal gland 03/24/2014  . New onset of headaches after age 26 03/23/2014  . Vitamin D deficiency 01/06/2014  . Weight gain following gastric bypass surgery 12/03/2013  . Morbid obesity ([place]) 08/25/2013  . Diaphragmatic hernia 04/06/2013  . Diabetic polyneuropathy ([place]) 03/25/2013  . Abnormality of  gait 03/25/2013  . Multiple pulmonary nodules 03/20/2013  . DOE (dyspnea on exertion) 03/20/2013  . Iron deficiency anemia 08/06/2012  . Functional disorder of bladder 07/15/2012  . Incomplete bladder emptying 07/15/2012  . 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 Urge incontinence 07/15/2012  . Increased frequency of urination 07/15/2012  . OSA on CPAP 03/02/2012  . Insomnia secondary to anxiety 02/29/2012  . Sciatica 09/05/2011  . Cervical stenosis of spinal canal 06/14/2011   . Restless legs syndrome 03/13/2011  . Essential hypertension 03/12/2011  . Type 2 diabetes mellitus with diabetic neuropathy, unspecified ([place]) 03/12/2011  . Hearing loss, right 03/12/2011    Immunization History  Administered Date(s) Administered  . Fluad Quad(high Dose 65+) 03/06/2019, 06/07/2020  . Influenza Split 06/13/2011  . Influenza, High Dose Seasonal PF 08/21/2018  . Influenza,inj,Quad PF,6+ Mos 05/04/2014, 05/12/2015  . Influenza-Unspecified 04/15/2012  . Moderna Sars-Covid-2 Vaccination 02/02/2020, 03/01/2020, 09/01/2020  . Pneumococcal Conjugate-13 08/11/2013, 01/06/2014  . Pneumococcal Polysaccharide-23 01/19/2010, 09/03/2018  . Tdap 04/18/2016, 11/20/2020    Conditions to be addressed/monitored: CAD, HTN, HLD and DMII  Care Plan : Medication Management  Updates made by De Hollingshead, RPH-CPP since 12/07/2020 12:00 AM    Problem: Diabetes, CHF     Long-Range Goal: Disease Progression Prevention   This Visit's Progress: On track  Recent Progress: On track  Priority: High  Note:   Current Barriers:  . Unable to achieve control of diabetes   Pharmacist Clinical Goal(s):  [name] Over the next 90 days, patient will achieve control of diabetes as evidenced by improvement in A1c through collaboration with PharmD and provider  Interventions: . 1:1 collaboration with [name], MD regarding development and update of comprehensive plan of care as evidenced by provider attestation and co-signature . Inter-disciplinary care team collaboration (see longitudinal plan of care) . Comprehensive medication review performed; medication list updated in electronic medical record  Diabetes: . Uncontrolled; current treatment: Farxiga 10 mg daily, Lantus 15 units QAM, Novolog up to 8 units up to TID with meals  o Hx intolerance to metformin d/t diarrhea o Hx Trulicity - exacerbated diarrhea  o Hx glipizide - stopped previously when A1c was well controlled  o Hx Januvia  - stopped previously when Trulicity was started  o Avoiding TZD due to HF . Noted yesterday that she is having a difficult time communicating with [facility] to have next shipment authorized. I called back today to f/u. They note that Medicare is showing a shipment from the prior CGM supplier on 4/14. We canceled their order om 4/6, and patient did not receive an order from them. However, it has been 30 days since that last bill so they can go ahead and ship. They removed the insurance hold and processed the order today with me on the phone. The order will ship out today, allow 5-7 business days to receive it.   HFpEF: . Moderately well controlled; current regimen: torsemide 10 mg daily, potassium 20 mEq daily, metolazone 2.5 mg daily . Continue current regimen at this time. BMP collected today.   Hyperlipidemia, secondary ASCVD prevention: . Controlled per last lab; current treatment: atorvastatin 40 mg daily  . Most recent LHC showed nonobstructive CAD w/ patent stenting.  . Current antiplatelet regimen: aspirin 81 mg daily, clopidogrel 75 mg daily - plan for DAPT for full 12 months s/p stenting (12/2020) . Previously recommended to continue current regimen at this time along with cardiology collaboration.  Depression/Anxiety with Insomnia: . Moderately well managed. Current  treatment: citalopram 10 mg daily, bupropion 150 mg QAM, 75 mg QPM, diazepam 5 mg QPM, Lunesta 3 mg QPM . Continue to monitor for increased sedation w/ multiple CNS sedating medications. Previously recommended to continue current regimen at this time  Chronic Diarrhea, S/p Gastric Bypass, Esophageal "growth" . Moderately well managed, given patient history. Current regimen: dicyclomine 10 mg QID PRN, hyoscyamine 10 mg QID PRN . Notes that she needs her esophagus stretched, but is awaiting cardiology clearance to be able to hold clopidogrel. . Previously recommended to continue current regimen at this time  Nocturnal "leg  cramps", as well as chronic pain . More exacerbated recently per patient report. Current regimen: pregabalin 100 mg QPM PRN;  . Previously recommended to continue current regimen at this time. Monitor for increased sedation with fluctuations in renal function  COPD:  . Well controlled; Current regimen: Anoro 62.5/25 mcg daily, albuterol HFA PRN, notes overall improvement in SOB recently . Previously recommended to continue current regimen at this time.   Patient Goals/Self-Care Activities . Over the next 90 days, patient will:  - take medications as prescribed check blood glucose at least three times daily using CGM, document, and provide at future appointments  Follow Up Plan: Face to Face appointment with care management team member scheduled for:  ~ 3 weeks as previously scheduled      Medication Assistance: None required.  Patient affirms current coverage meets needs.  Patient's preferred pharmacy is:  [name]: [phone]: [phone] [facility] [place] [address] [place] [address]Phone: [phone]: [phone] [facility] [place] [address]Phone: [phone]: [phone]  Follow Up:  Patient agrees to Care Plan and Follow-up.  Plan: Telephone follow up appointment with care management team member scheduled for:  ~ 3 weeks as previously scheduled  Catie TDarnelle Maffucci PharmD, BSalt [place] CHughesvilleClinical Pharmacist LOccidental [facility] [phone]

## 2020-12-07 NOTE — Patient Instructions (Signed)
Visit Information  PATIENT GOALS: Goals Addressed              This Visit's Progress     Patient Stated   .  Medication Monitoring (pt-stated)        Patient Goals/Self-Care Activities . Over the next 90 days, patient will:  - take medications as prescribed check blood glucose at least three times daily using CGM, document, and provide at future appointments        Patient verbalizes understanding of instructions provided today and agrees to view in [place].   Plan: Telephone follow up appointment with care management team member scheduled for:  ~ 3 weeks as previously scheduled  Catie Darnelle Maffucci, PharmD, [place], [place] Clinical Pharmacist [employer] at [employer] [phone]

## 2020-12-09 ENCOUNTER — Encounter: Payer: Self-pay | Admitting: Internal Medicine

## 2020-12-09 ENCOUNTER — Other Ambulatory Visit: Payer: Self-pay | Admitting: Internal Medicine

## 2020-12-09 ENCOUNTER — Telehealth: Payer: Self-pay | Admitting: Internal Medicine

## 2020-12-09 ENCOUNTER — Ambulatory Visit: Payer: Medicare Other | Admitting: *Deleted

## 2020-12-09 DIAGNOSIS — I5032 Chronic diastolic (congestive) heart failure: Secondary | ICD-10-CM

## 2020-12-09 DIAGNOSIS — E1122 Type 2 diabetes mellitus with diabetic chronic kidney disease: Secondary | ICD-10-CM | POA: Insufficient documentation

## 2020-12-09 DIAGNOSIS — E1140 Type 2 diabetes mellitus with diabetic neuropathy, unspecified: Secondary | ICD-10-CM

## 2020-12-09 DIAGNOSIS — R296 Repeated falls: Secondary | ICD-10-CM

## 2020-12-09 DIAGNOSIS — R601 Generalized edema: Secondary | ICD-10-CM

## 2020-12-09 DIAGNOSIS — N183 Type 2 diabetes mellitus with diabetic chronic kidney disease: Secondary | ICD-10-CM | POA: Insufficient documentation

## 2020-12-09 DIAGNOSIS — I2510 Atherosclerotic heart disease of native coronary artery without angina pectoris: Secondary | ICD-10-CM | POA: Diagnosis not present

## 2020-12-09 DIAGNOSIS — N179 Acute kidney failure, unspecified: Secondary | ICD-10-CM

## 2020-12-09 DIAGNOSIS — I10 Essential (primary) hypertension: Secondary | ICD-10-CM | POA: Diagnosis not present

## 2020-12-09 DIAGNOSIS — E1165 Type 2 diabetes mellitus with hyperglycemia: Secondary | ICD-10-CM | POA: Diagnosis not present

## 2020-12-09 DIAGNOSIS — I5033 Acute on chronic diastolic (congestive) heart failure: Secondary | ICD-10-CM | POA: Diagnosis not present

## 2020-12-09 NOTE — Chronic Care Management (AMB) (Signed)
Chronic Care Management   CCM RN Visit Note  12/09/2020 Name: Denise Sharp MRN: [id number] DOB: [birth year]  Subjective: Denise Sharp is a 75 y.o. year old female who is a primary care patient of [name], Aris Everts, MD. The care management team was consulted for assistance with disease management and care coordination needs.    Engaged with patient by telephone for follow up visit in response to provider referral for case management and/or care coordination services.   Consent to Services:  The patient was given information about Chronic Care Management services, agreed to services, and gave verbal consent prior to initiation of services.  Please see initial visit note for detailed documentation.   Patient agreed to services and verbal consent obtained.   Assessment: Review of patient past medical history, allergies, medications, health status, including review of consultants reports, laboratory and other test data, was performed as part of comprehensive evaluation and provision of chronic care management services.   SDOH (Social Determinants of Health) assessments and interventions performed:    CCM Care Plan  Allergies  Allergen Reactions  . Demeclocycline Hives  . Erythromycin Nausea And Vomiting and Other (See Comments)    Severe irritable bowel  . Flagyl [Metronidazole] Nausea And Vomiting and Other (See Comments)    Severe irritable bowel  . Glucophage [Metformin Hcl] Nausea And Vomiting and Other (See Comments)    "Sick" "I won't take anything that has metformin in it"  . Tetracyclines & Related Hives and Rash  . Diovan [Valsartan] Nausea Only       . Sulfa Antibiotics Rash and Other (See Comments)    As child  . Xanax [Alprazolam] Other (See Comments)    Unknown reaction    Outpatient Encounter Medications as of 12/09/2020  Medication Sig Note  . metolazone (ZAROXOLYN) 2.5 MG tablet Take 1 tablet (2.5 mg total) by mouth daily. 30 minutes before torsemide dose    . torsemide (DEMADEX) 10 MG tablet Take 1 tablet (10 mg total) by mouth daily.   [name] albuterol (VENTOLIN HFA) 108 (90 Base) MCG/ACT inhaler Inhale 2 puffs into the lungs every 8 (eight) hours as needed for wheezing or shortness of breath.   [name] aspirin EC 81 MG tablet Take 1 tablet (81 mg total) by mouth daily.   [name] atorvastatin (LIPITOR) 40 MG tablet Take 1 tablet (40 mg total) by mouth daily.   [name] buPROPion (WELLBUTRIN) 75 MG tablet Take 1 tablet (75 mg total) by mouth 2 (two) times daily.   . butalbital-acetaminophen-caffeine (FIORICET) 50-325-40 MG tablet TAKE 1 TABLET BY MOUTH EVERY 6 HOURS AS NEEDED   . citalopram (CELEXA) 10 MG tablet Take 1 tablet (10 mg total) by mouth daily.   . clopidogrel (PLAVIX) 75 MG tablet Take 1 tablet (75 mg total) by mouth daily.   . clotrimazole-betamethasone (LOTRISONE) cream Apply 1 application topically 2 (two) times daily as needed.   . Continuous Blood Gluc Receiver (FREESTYLE LIBRE 2 READER) DEVI Use to check sugar at least TID   . cyanocobalamin (,VITAMIN B-12,) 1000 MCG/ML injection Inject 1 mL (1,000 mcg total) into the muscle every 30 (thirty) days. Inject 1 ml (1000 mcg ) IM weekly x 4,  Then monthly thereafter   . dapagliflozin propanediol (FARXIGA) 10 MG TABS tablet Take 1 tablet (10 mg total) by mouth daily before breakfast.   . diazepam (VALIUM) 5 MG tablet Take 1 tablet (5 mg total) by mouth at bedtime as needed for anxiety. (Patient taking differently: Take 5  mg by mouth at bedtime.)   . diphenhydrAMINE (BENADRYL) 25 MG tablet Take 50 mg by mouth daily as needed for allergies.   . diphenoxylate-atropine (LOMOTIL) 2.5-0.025 MG tablet Take 1 tablet by mouth 4 (four) times daily as needed for diarrhea or loose stools.   . Eszopiclone 3 MG TABS Take 1 tablet (3 mg total) by mouth at bedtime.   . fexofenadine (ALLEGRA) 180 MG tablet Take 180 mg by mouth daily.   . hyoscyamine (ANASPAZ) 0.125 MG TBDP disintergrating tablet DISSOLVE UNDER TONGUE 15 MINUTES  BEFORE MEALS   . insulin aspart (NOVOLOG FLEXPEN) 100 UNIT/ML FlexPen Inject 8 units up to 3 times daily before meals   . insulin glargine (LANTUS SOLOSTAR) 100 UNIT/ML Solostar Pen Inject 15 Units into the skin daily.   . Insulin Pen Needle (DROPLET PEN NEEDLES) 31G X 5 MM MISC USE ONE NEEDLE SUBCUTANEOUSLY AS DIRECTED (REMOVE AND DISCARD NEEDLE IN SHARPS CONTAINER IMMEDIATELY AFTER USE)   . ipratropium (ATROVENT) 0.06 % nasal spray Place 2 sprays into both nostrils 4 (four) times daily as needed for rhinitis.   [name] loperamide (IMODIUM) 2 MG capsule Take by mouth as needed.   . metoprolol tartrate (LOPRESSOR) 25 MG tablet Take 0.5 tablets (12.5 mg total) by mouth 2 (two) times daily. 11/25/2020: Picking up from pharmacy  . metoprolol tartrate (LOPRESSOR) 25 MG tablet Take 0.5 tablets (12.5 mg total) by mouth 2 (two) times daily.   . nitroGLYCERIN (NITROSTAT) 0.4 MG SL tablet Place 1 tablet (0.4 mg total) under the tongue every 5 (five) minutes as needed for chest pain.   . NONFORMULARY OR COMPOUNDED [place]  Combination Pain Cream -  Baclofen 2%, Doxepin 5%, Gabapentin 6%, Topiramate 2%, Pentoxifylline 3% Apply 1-2 grams to affected area 3-4 times daily Qty. 120 gm 3 refills   . nystatin (MYCOSTATIN/NYSTOP) powder Apply 1 g topically 4 (four) times daily as needed (rash).   . potassium chloride SA (KLOR-CON) 20 MEQ tablet Take 1 tablet (20 mEq total) by mouth daily.   . pregabalin (LYRICA) 50 MG capsule Take 1 capsule (50 mg total) by mouth 2 (two) times daily.   . traMADol (ULTRAM) 50 MG tablet Take 0.5 tablets (25 mg total) by mouth every 12 (twelve) hours as needed for severe pain.   [name] umeclidinium-vilanterol (ANORO ELLIPTA) 62.5-25 MCG/INH AEPB Inhale 1 puff into the lungs daily.    No facility-administered encounter medications on file as of 12/09/2020.    Patient Active Problem List   Diagnosis Date Noted  . Temporal arteritis syndrome ([place]) 11/17/2020  . Other pulmonary  embolism with acute cor pulmonale, unspecified chronicity ([place]) 11/17/2020  . Mild episode of recurrent major depressive disorder ([place]) 11/17/2020  . Hyperlipidemia associated with type 2 diabetes mellitus ([place]) 08/21/2020  . Iron deficiency 06/28/2020  . Skin lesion of chest wall 06/10/2020  . Elevated alkaline phosphatase level 06/10/2020  . Coagulation defect ([place]) 06/06/2020  . Chronic heart failure with preserved ejection fraction (HFpEF) ([place]) 01/14/2020  . Hyperlipidemia LDL goal <70 01/14/2020  . Coronary artery disease involving native coronary artery of native heart without angina pectoris 01/13/2020  . S/P cardiac catheterization 01/13/2020  . Accelerating angina ([place]) 12/31/2019  . Dysphagia 11/30/2019  . Bladder pain 08/23/2019  . Gastritis 08/07/2019  . Lab test negative for COVID-19 virus 08/07/2019  . S/P panniculectomy 06/16/2019  . Major depressive disorder with current active episode 05/30/2019  . Exposure to COVID-19 virus 12/25/2018  . Functional diarrhea 10/22/2018  .  Bilateral cataracts 06/26/2018  . Status post bariatric surgery 02/05/2018  . GAD (generalized anxiety disorder) 02/01/2018  . Rectocele 01/24/2018  . Vaginal prolapse 01/04/2018  . Hepatic steatosis 01/04/2018  . Microalbuminuria due to type 2 diabetes mellitus ([place]) 11/21/2017  . Hospital discharge follow-up 09/22/2017  . Hypocalcemia 09/22/2017  . Hypotension 09/22/2017  . Fecal incontinence 09/22/2017  . B12 deficiency 09/22/2017  . Transient disorientation 08/20/2017  . Rotator cuff arthropathy, right 08/13/2017  . Primary localized osteoarthrosis of shoulder 08/13/2017  . Encounter for preoperative examination for general surgical procedure 08/03/2017  . 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 Candidiasis, intertrigo 08/03/2017  . 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 Edema 12/13/2015  . Hypersomnia, persistent 06/18/2015   . Mechanical breakdown of implanted electronic neurostimulator of peripheral nerve ([place]) 10/16/2014  . Obesity hypoventilation syndrome ([place]) 08/13/2014  . Frequent falls 06/23/2014  . Chronic cough 05/05/2014  . Adenoma of left adrenal gland 03/24/2014  . New onset of headaches after age 19 03/23/2014  . Vitamin D deficiency 01/06/2014  . Weight gain following gastric bypass surgery 12/03/2013  . Morbid obesity ([place]) 08/25/2013  . Diaphragmatic hernia 04/06/2013  . Diabetic polyneuropathy ([place]) 03/25/2013  . Abnormality of gait 03/25/2013  . Multiple pulmonary nodules 03/20/2013  . DOE (dyspnea on exertion) 03/20/2013  . Iron deficiency anemia 08/06/2012  . Functional disorder of bladder 07/15/2012  . Incomplete bladder emptying 07/15/2012  . 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 Urge incontinence 07/15/2012  . Increased frequency of urination 07/15/2012  . OSA on CPAP 03/02/2012  . Insomnia secondary to anxiety 02/29/2012  . Sciatica 09/05/2011  . Cervical stenosis of spinal canal 06/14/2011  . Restless legs syndrome 03/13/2011  . Essential hypertension 03/12/2011  . Type 2 diabetes mellitus with diabetic neuropathy, unspecified ([place]) 03/12/2011  . Hearing loss, right 03/12/2011    Conditions to be addressed/monitored:CHF, DMII and Falls  Care Plan : Heart Failure (Adult)  Updates made by Leona Singleton, RN since 12/09/2020 12:00 AM  Problem: Symptom Exacerbation (Heart Failure)   Priority: Medium  Long-Range Goal: Symptom Exacerbation Prevented or Minimized   Start Date: 10/19/2020  Expected End Date: 04/14/2021  This Visit's Progress: On track  Recent Progress: On track  Priority: Medium  Current Barriers:  [name] Knowledge deficit related to basic heart failure pathophysiology and self care management patient reporting she continues to have shortness of breath, as normal and some chest pains.  States she has not had to take NTG in the last 2 weeks.  Denies any  lower extremity swelling (does have some swelling in her right knee from fall).  Continues to weigh daily.  Weight yesterday was 244 pounds.  States she feels new diuretic medications are working well thus far.  Patient concerned about not having her bladder inerstim implant checked in the past few years and a recent increase in urinary incontinence.  States she turned up her stimulus on the implant and incontinence has improved. Case Manager Clinical Goal(s):  . patient will weigh self daily and record . patient will verbalize understanding of Heart Failure Action Plan and when to call doctor . patient will take all Heart Failure mediations as prescribed . patient will weigh daily and record (notifying MD of 3 lb weight gain over night or 5 lb in a week) Interventions:  . Collaboration with [name], MD regarding development and update of comprehensive plan of care as evidenced by provider  attestation and co-signature . Inter-disciplinary care team collaboration (see longitudinal plan of care) . Basic overview and discussion of pathophysiology of Heart Failure reviewed  . Provided verbal education on low sodium diet . Provided education about placing scale on hard, flat surface and advised patient to weigh each morning after emptying bladder . Discussed importance of daily weight and advised patient to weigh and record daily . Reviewed role of diuretics in prevention of fluid overload and management of heart failure; encouraged compliance with current dosing of Demadex; encouraged to monitor urine output with reduced dose and weight and to notify provider for increased weight or decreased output . Discussed importance of taking Metolazone 30 minutes prior to taking Demadex . Barriers to lifestyle changes reviewed and addressed and barriers to treatment reviewed and addressed . Medication-adherence assessment completed and encouraged . Rescue (action) plan reviewed . Discussed palpitations  and encouraged patient to pick new medication up from pharmacy and to start medication as prescribed . Self-awareness of signs/symptoms of worsening disease encouraged . Fall precautions and preventions reviewed and discussed . Discussed urinary incontinence and possible need for bedside commode . Encouraged patient to contact Urology at [facility] to discuss bladder interstim implant and increase in incontinence . Encouraged to increase activity or exercise based on tolerance and encouraged to pace activity allowing for rest . Reviewed foods high in sodium, discussed eating fresh fruits and vegetables and avoiding canned and processed foods, encouraged to continue to read food labels Patient Goals/Self-Care Activities: . Call office if I gain more than 2 pounds in one day or 5 pounds in one week . Watch for swelling in feet, ankles and legs every day . Weigh myself daily, first thing in morning after emptying bladder . Record weighs in log for provider review . Eat fresh or frozen vegetables, avoid canned and processed foods . Monitor urine output with decrease dose of fluid medication . Administrator for bladder Interstim Implant Follow Up Plan: The care management team will reach out to the patient again over the next 20 business days.     Care Plan : Diabetes Type 2 (Adult)  Updates made by Leona Singleton, RN since 12/09/2020 12:00 AM  Problem: Glycemic Management (Diabetes, Type 2)   Priority: High  Long-Range Goal: Patient will report no sustained fasting blood sugars greater than 200 within the next 60 days   Start Date: 10/19/2020  Expected End Date: 04/14/2021  This Visit's Progress: Not on track  Recent Progress: Not on track  Priority: High  Objective:  Lab Results  Component Value Date   HGBA1C 8.2 (A) 11/15/2020  Current Barriers:  [name] Knowledge Deficits related to basic Diabetes pathophysiology and self care/management as evidenced by patient 's continued elevated blood  sugars.  Fasting blood sugar this morning was 167 with recent ranges of 140-200's.  (200-300's in the afternoons).  States she is unsure why blood sugars continue to be elevated, she does not feel like it is something she is eating.  Denies any hypoglycemic episodes or any recent change in diet.  Does report another fall this past Saturday on 01/03/21, sliding off a stool.  Discussed home health physical therapy; patient declines at this time.  Also reports husband had fall on Sunday and family is discussing questionable need for ALF. Case Manager Clinical Goal(s):  . patient will demonstrate improved adherence to prescribed treatment plan for diabetes self care/management as evidenced by: 3 times a day monitoring and recording of CBG,  adherence to  ADA/ carb modified diet, adherence to prescribed medication regimen, contacting provider for new or worsened symptoms or questions Interventions:  . Collaboration with [name], MD regarding development and update of comprehensive plan of care as evidenced by provider attestation and co-signature . Inter-disciplinary care team collaboration (see longitudinal plan of care) . Provided education to patient about basic DM disease process . Reviewed medications with patient and discussed importance of medication adherence . Discussed plans with patient for ongoing care management follow up and provided patient with direct contact information for care management team . Provided patient with educational materials related to hypo and hyperglycemia and importance of correct treatment . Advised patient, providing education and rationale, to check cbg 3 times a day and record, calling CCM Pharmacist or PCP for findings outside established parameters.   . Barriers to adherence to treatment plan identified . Blood glucose readings reviewed and discussed ways to help lower ranges . Resources required to improve adherence to care identified . Self-awareness of  signs/symptoms of hypo or hyperglycemia encouraged . Use of blood glucose monitoring log promoted . Discussed and reviewed healthier low carbohydrate food and drink options . Confirmed patient has not had yearly eye exam and encouraged patient to schedule as soon as possible . Encouraged to continue to work with CCM Pharmacist; encouraged patient to contact CCM Pharmacist for sustained elevations . Encouraged to attend all scheduled provider appointments  . Discussed and encouraged to adhere to prescribed ADA/carb modified  Assessed barriers to management plan, such as food insecurity, age, developmental ability, depression, anxiety, fear of hypoglycemia or weight gain, as well as medication cost, side effects and complicated regimen.   Discussed continuous blood sugar monitoring system and need to scan multiple times a day to check blood sugars  Fall precautions and preventions reviewed and discussed; encouraged to use walker with all ambulation;  Discuss home health therapy/nursing related to increase in falls; patient declines referral at this time  Encouraged patient to really consider retirement community/assisted living facility for safety Patient Goals/Self-Care Activities: . Check blood sugar at least 3 times a day . Check blood sugar if I feel it is too high or too low . Enter blood sugar readings and medication/insulin into daily log for provider review . Schedule yearly diabetic eye exam . Contact CCM Pharmacist or PCP if blood sugars sustained over 250 consistently . Try to avoid drinking sweet tea with regular sugar . Use walker or wheelchair for all ambulation . Increase activity as tolerated; Fall precautions and preventions . Please consider Home Health Physical Therapy Follow Up Plan: The care management team will reach out to the patient again over the next 30 business days.       Plan:The care management team will reach out to the patient again over the next 20  business days.  Hubert Azure RN, MSN RN Care Management Coordinator [place] [phone] [email or web address]

## 2020-12-09 NOTE — Assessment & Plan Note (Addendum)
Persistent despite reduced diuretic use .  She has a normal EF but evidence of HFpEF by cardiac cath Feb 2022.   Will dc torsemide and metolazone and repeat renal function in one week   Lab Results  Component Value Date   CREATININE 1.79 (H) 12/06/2020   Lab Results  Component Value Date   LABMICR Comment 11/11/2014   MICROALBUR 2.8 (H) 06/04/2017   MICROALBUR 9.5 (H) 06/29/2016

## 2020-12-09 NOTE — Telephone Encounter (Signed)
Patient was returning your call she stated that she was in bathroom and you call her back

## 2020-12-09 NOTE — Patient Instructions (Signed)
Visit Information  PATIENT GOALS: Goals Addressed            This Visit's Progress   . (RNCM) Monitor and Manage My Blood Sugar-Diabetes Type 2   Not on track    Timeframe:  Long-Range Goal Priority:  High Start Date:     10/19/20                      Expected End Date:   06/14/21                    Follow Up Date 12/23/20    . Check blood sugar at least 3 times a day . Check blood sugar if I feel it is too high or too low . Enter blood sugar readings and medication/insulin into daily log for provider review . Schedule yearly diabetic eye exam . Contact CCM Pharmacist or PCP if blood sugars sustained over 250 consistently . Try to avoid drinking sweet tea with regular sugar . Use walker or wheelchair for all ambulation . Increase activity as tolerated; Fall precautions and preventions . Please consider Home Health Physical Therapy  Why is this important?    Checking your blood sugar at home helps to keep it from getting very high or very low.   Writing the results in a diary or log helps the doctor know how to care for you.   Your blood sugar log should have the time, date and the results.   Also, write down the amount of insulin or other medicine that you take.   Other information, like what you ate, exercise done and how you were feeling, will also be helpful.     Notes:     [name] (RNCM) Track and Manage Fluids and Swelling-Heart Failure   On track    Timeframe:  Long-Range Goal Priority:  Medium Start Date: 10/19/20                            Expected End Date:    06/14/21                   Follow Up Date 12/23/20    . Call office if I gain more than 2 pounds in one day or 5 pounds in one week . Watch for swelling in feet, ankles and legs every day . Weigh myself daily, first thing in morning after emptying bladder . Record weighs in log for provider review . Eat fresh or frozen vegetables, avoid canned and processed foods . Monitor urine output with decrease dose of  fluid medication   Why is this important?    It is important to check your weight daily and watch how much salt and liquids you have.   It will help you to manage your heart failure.    Notes:        Patient verbalizes understanding of instructions provided today and agrees to view in [place].   The care management team will reach out to the patient again over the next 30 business days.   Hubert Azure RN, MSN RN Care Management Coordinator [place] [phone] [email or web address]

## 2020-12-09 NOTE — Telephone Encounter (Signed)
This patient is not on my schedule today, she is on the nurse care manager's schedule. I will route this message to [place].

## 2020-12-14 ENCOUNTER — Other Ambulatory Visit
Admission: RE | Admit: 2020-12-14 | Discharge: 2020-12-14 | Disposition: A | Discharge status: Home / Self Care | Payer: Medicare Other | Attending: Internal Medicine | Admitting: Internal Medicine

## 2020-12-14 ENCOUNTER — Other Ambulatory Visit: Payer: Self-pay

## 2020-12-14 ENCOUNTER — Encounter: Payer: Self-pay | Admitting: Internal Medicine

## 2020-12-14 ENCOUNTER — Ambulatory Visit (INDEPENDENT_AMBULATORY_CARE_PROVIDER_SITE_OTHER): Payer: Medicare Other | Admitting: Internal Medicine

## 2020-12-14 VITALS — BP 110/60 | HR 86 | Ht 64.5 in | Wt 247.0 lb

## 2020-12-14 DIAGNOSIS — I2510 Atherosclerotic heart disease of native coronary artery without angina pectoris: Secondary | ICD-10-CM | POA: Diagnosis not present

## 2020-12-14 DIAGNOSIS — I5032 Chronic diastolic (congestive) heart failure: Secondary | ICD-10-CM | POA: Insufficient documentation

## 2020-12-14 DIAGNOSIS — I471 Supraventricular tachycardia: Secondary | ICD-10-CM | POA: Diagnosis not present

## 2020-12-14 LAB — CBC
HCT: 38.3 % (ref 36.0–46.0)
Hemoglobin: 12.5 g/dL (ref 12.0–15.0)
MCH: 31.3 pg (ref 26.0–34.0)
MCHC: 32.6 g/dL (ref 30.0–36.0)
MCV: 95.8 fL (ref 80.0–100.0)
Platelets: 304 10*3/uL (ref 150–400)
RBC: 4 MIL/uL (ref 3.87–5.11)
RDW: 14.4 % (ref 11.5–15.5)
WBC: 13.7 10*3/uL — ABNORMAL HIGH (ref 4.0–10.5)
nRBC: 0 % (ref 0.0–0.2)

## 2020-12-14 LAB — BASIC METABOLIC PANEL
Anion gap: 12 (ref 5–15)
BUN: 41 mg/dL — ABNORMAL HIGH (ref 8–23)
CO2: 26 mmol/L (ref 22–32)
Calcium: 8.6 mg/dL — ABNORMAL LOW (ref 8.9–10.3)
Chloride: 99 mmol/L (ref 98–111)
Creatinine, Ser: 1.64 mg/dL — ABNORMAL HIGH (ref 0.44–1.00)
GFR, Estimated: 32 mL/min — ABNORMAL LOW (ref >60)
Glucose, Bld: 200 mg/dL — ABNORMAL HIGH (ref 70–99)
Potassium: 3.2 mmol/L — ABNORMAL LOW (ref 3.5–5.1)
Sodium: 137 mmol/L (ref 135–145)

## 2020-12-14 LAB — BRAIN NATRIURETIC PEPTIDE: B Natriuretic Peptide: 83.4 pg/mL (ref 0.0–100.0)

## 2020-12-14 NOTE — Progress Notes (Signed)
Follow-up Outpatient Visit Date: 12/14/2020  Primary Care Provider: [name], MD [address] [place] [address]  Chief Complaint: Shortness of breath and fatigue  HPI:  Ms. Swofford is a 75 y.o. female with history of CAD status post PCI to the ostial and mid LAD in the setting of unstable angina in 12/2019, hypertension, type 2 diabetes mellitus, iron deficiency anemia following gastric bypass x2, irritable bowel syndrome, esophageal stenosis, chronic dizziness with gait instability, cervicogenic headache with cervical spine stenosis, obstructive sleep apnea on CPAP, restless leg syndrome, and adrenal mass, who presents for follow-up of chronic HFpEF and coronary artery disease.  I last saw her in March following right and left heart catheterization that showed nonobstructive CAD with patent LAD stents and severely elevated right heart filling pressures.  Diuresis was escalated, though this has needed to be scaled back due to worsening renal function.  She was last seen in our office on 11/02/2020 by Ignacia Bayley, NP, at which time she reported feeling better with less exertional dyspnea.  She complained of frequent tachypalpitations happening almost daily and lasting up to 60 minutes.  Subsequent event monitor showed several episodes of NSVT and PSVT.  She was started on low-dose metoprolol but does not feel any different but reports that she has had minimal palpitations since her last visit.  Today, Ms. Waldron continues to complain of significant fatigue and shortness of breath with minimal activity such as walking through the office today.  She is concerned that she is not making much urine despite having been on metolazone and torsemide.  Her worsening creatinine is also of concern to her.  She was contacted yesterday by [name] and advised to stop all diuretics.  She continues to notice mild leg swelling at times.  She feels like her abdomen is distended as well.  She  denies orthopnea and PND, remaining compliant with her CPAP.  She reports fleeting chest pains that are not exertional.  She denies lightheadedness, though she has had a few low blood pressures that have led to considerable fatigue.  She has yet to be contacted by the heart failure clinic to schedule a consultation.  --------------------------------------------------------------------------------------------------  Past Medical History:  Diagnosis Date  . (HFpEF) heart failure with preserved ejection fraction ([place])    a. 12/2019 Echo: EF 65-70%, Gr2 DD. No significant valvular dzs.  . Abnormality of gait 03/25/2013  . Adrenal mass, left ([place])   . Anemia    iron deficient post  2 unit txfsn 2009, normal endo/colonoscopy by [facility]  . Arthritis   . CAD (coronary artery disease) with h/o Atypical Chest Pain    a. 04/1986 Cath ([facility]): nl cors, EF 65%; b. 07/2012 Cath [facility]): Diff minor irregs; c. 07/2016 MV [facility]): "Equivocal"; d. 08/2016 Cardiac CT Ca2+ score [facility]): Ca2+ 9357; e. 05/2019 MV: No ischemia. EF 75%; f. 12/2019 PCI: LM nl, LAD 65ost (3.5x12 Resolute DES), 66m (2.75x12 Resolute DES),LCX/RCA nl; g. 08/2020 Cath: patent LAD stents, RCA 40ost, elev RH pressures, EF >65%.  . Cervical spinal stenosis 1994   due to trauma to back ([employer] accident), has intermittent paralysis and parasthesias  . Cervicogenic headache 03/23/2014  . Depression   . Dizziness    chronic dizziness  . DJD (degenerative joint disease)    a. Chronic R shoulder pain pending R shoulder replacement 07/2017.  [name] Esophageal stenosis March 2011   with transietn outlet obstruction by food, cleared by EGD   . Family history of  adverse reaction to anesthesia    daughter PONV  . Gastric bypass status for obesity   . Headache(784.0)   . Hypertension   . IBS (irritable bowel syndrome)   . Left bundle branch block (LBBB)    a. Intermittently present - likely rate related. - patient denies  . Obesity   . Obstructive sleep apnea     using CPAP  . 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 Restless leg syndrome   . Rotator cuff arthropathy, right 08/13/2017  . Syncope and collapse 03/12/2014  . Type II diabetes mellitus ([place])    Past Surgical History:  Procedure Laterality Date  . ABDOMINAL HYSTERECTOMY    . APPENDECTOMY    . CARDIAC CATHETERIZATION    . COLONOSCOPY    . CORONARY STENT INTERVENTION N/A 01/04/2020   Procedure: CORONARY STENT INTERVENTION;  Surgeon: [name], MD;  Location: [place] [facility];  Service: Cardiovascular;  Laterality: N/A;  LAD   . DIAPHRAGMATIC HERNIA REPAIR  2015  . ESOPHAGEAL DILATION     multiple  . ESOPHAGOGASTRODUODENOSCOPY    . [place]  . GALLBLADDER SURGERY  resection  . GASTRIC BYPASS    . GASTRIC BYPASS  2000, 2005   Dr. Debroah Loop  . [facility] IMPLANT PLACEMENT  April 2013   [place]  . JOINT REPLACEMENT  2007   bilateral knee. [name],  [name]  . LEFT HEART CATH AND CORS/GRAFTS ANGIOGRAPHY N/A 12/31/2019   Procedure: LEFT HEART CATH AND CORS/GRAFTS ANGIOGRAPHY;  Surgeon: [name], MD;  Location: [place] [facility];  Service: Cardiovascular;  Laterality: N/A;  . PANNICULECTOMY  06/16/2019  . PANNICULECTOMY N/A 06/16/2019   Procedure: PANNICULECTOMY;  Surgeon: [name], MD;  Location: [place];  Service: Plastics;  Laterality: N/A;  3 hours, please  . REVERSE SHOULDER ARTHROPLASTY Right 08/13/2017   Procedure: REVERSE RIGHT SHOULDER ARTHROPLASTY;  Surgeon: [name], MD;  Location: [place];  Service: Orthopedics;  Laterality: Right;  . RIGHT/LEFT HEART CATH AND CORONARY ANGIOGRAPHY N/A 09/06/2020   Procedure: RIGHT/LEFT HEART CATH AND CORONARY ANGIOGRAPHY;  Surgeon: [name], MD;  Location: [place] [facility];  Service: Cardiovascular;  Laterality: N/A;  . ROTATOR CUFF REPAIR     right  . [place]  . TOTAL ABDOMINAL HYSTERECTOMY W/ BILATERAL SALPINGOOPHORECTOMY  1974  . UMBILICAL HERNIA REPAIR  Aug 11, 2015    Current Meds  Medication Sig  . albuterol (VENTOLIN HFA) 108 (90 Base) MCG/ACT inhaler Inhale 2 puffs into the lungs every 8 (eight) hours as needed for wheezing or shortness of breath.  [name] aspirin EC 81 MG tablet Take 1 tablet (81 mg total) by mouth daily.  [name] atorvastatin (LIPITOR) 40 MG tablet Take 1 tablet (40 mg total) by mouth daily.  [name] buPROPion (WELLBUTRIN) 75 MG tablet Take 1 tablet (75 mg total) by mouth 2 (two) times daily.  . butalbital-acetaminophen-caffeine (FIORICET) 50-325-40 MG tablet TAKE 1 TABLET BY MOUTH EVERY 6 HOURS AS NEEDED  . citalopram (CELEXA) 10 MG tablet Take 1 tablet (10 mg total) by mouth daily.  . clopidogrel (PLAVIX) 75 MG tablet Take 1 tablet (75 mg total) by mouth daily.  . clotrimazole-betamethasone (LOTRISONE) cream Apply 1 application topically 2 (two) times daily as needed.  . Continuous Blood Gluc Receiver (FREESTYLE LIBRE 2 READER) DEVI Use to check sugar at least TID  . cyanocobalamin (,VITAMIN B-12,) 1000 MCG/ML injection Inject 1 mL (1,000 mcg total) into the muscle  every 30 (thirty) days. Inject 1 ml (1000 mcg ) IM weekly x 4,  Then monthly thereafter  . dapagliflozin propanediol (FARXIGA) 10 MG TABS tablet Take 1 tablet (10 mg total) by mouth daily before breakfast.  . diazepam (VALIUM) 5 MG tablet Take 1 tablet (5 mg total) by mouth at bedtime as needed for anxiety.  . diphenhydrAMINE (BENADRYL) 25 MG tablet Take 50 mg by mouth daily as needed for allergies.  . diphenoxylate-atropine (LOMOTIL) 2.5-0.025 MG tablet Take 1 tablet by mouth 4 (four) times daily as needed for diarrhea or loose stools.  . Eszopiclone 3 MG TABS Take 1 tablet (3 mg total) by mouth at bedtime.  . fexofenadine (ALLEGRA) 180 MG tablet Take 180 mg by mouth daily.  . hyoscyamine (ANASPAZ) 0.125 MG TBDP disintergrating tablet DISSOLVE UNDER TONGUE 15 MINUTES BEFORE MEALS  . insulin aspart (NOVOLOG FLEXPEN) 100 UNIT/ML FlexPen Inject 8 units up to 3 times daily before  meals  . insulin glargine (LANTUS SOLOSTAR) 100 UNIT/ML Solostar Pen Inject 15 Units into the skin daily.  . Insulin Pen Needle (DROPLET PEN NEEDLES) 31G X 5 MM MISC USE ONE NEEDLE SUBCUTANEOUSLY AS DIRECTED (REMOVE AND DISCARD NEEDLE IN SHARPS CONTAINER IMMEDIATELY AFTER USE)  . ipratropium (ATROVENT) 0.06 % nasal spray Place 2 sprays into both nostrils 4 (four) times daily as needed for rhinitis.  [name] loperamide (IMODIUM) 2 MG capsule Take by mouth as needed.  . metoprolol tartrate (LOPRESSOR) 25 MG tablet Take 0.5 tablets (12.5 mg total) by mouth 2 (two) times daily.  . nitroGLYCERIN (NITROSTAT) 0.4 MG SL tablet Place 1 tablet (0.4 mg total) under the tongue every 5 (five) minutes as needed for chest pain.  . NONFORMULARY OR COMPOUNDED [place]  Combination Pain Cream -  Baclofen 2%, Doxepin 5%, Gabapentin 6%, Topiramate 2%, Pentoxifylline 3% Apply 1-2 grams to affected area 3-4 times daily Qty. 120 gm 3 refills  . nystatin (MYCOSTATIN/NYSTOP) powder Apply 1 g topically 4 (four) times daily as needed (rash).  . potassium chloride SA (KLOR-CON) 20 MEQ tablet Take 1 tablet (20 mEq total) by mouth daily.  . pregabalin (LYRICA) 50 MG capsule Take 1 capsule (50 mg total) by mouth 2 (two) times daily.  . traMADol (ULTRAM) 50 MG tablet Take 0.5 tablets (25 mg total) by mouth every 12 (twelve) hours as needed for severe pain.  [name] umeclidinium-vilanterol (ANORO ELLIPTA) 62.5-25 MCG/INH AEPB Inhale 1 puff into the lungs daily.    Allergies: Demeclocycline, Erythromycin, Flagyl [metronidazole], Glucophage [metformin hcl], Tetracyclines & related, Diovan [valsartan], Sulfa antibiotics, and Xanax [alprazolam]  Social History   Tobacco Use  . Smoking status: Never Smoker  . Smokeless tobacco: Never Used  Vaping Use  . Vaping Use: Never used  Substance Use Topics  . Alcohol use: No  . Drug use: Yes    Comment: prescribed valium    Family History  Problem Relation Age of Onset  .  Heart disease Father   . Hypertension Father   . Prostate cancer Father   . Stroke Father   . Osteoporosis Father   . Stroke Mother   . Depression Mother   . Headache Mother   . Heart disease Mother   . Thyroid disease Mother   . Hypertension Mother   . Diabetes Daughter   . Heart disease Daughter   . Hypertension Daughter   . Hypertension Son     Review of Systems: A 12-system review of systems was performed and was negative except as noted  in the HPI.  --------------------------------------------------------------------------------------------------  Physical Exam: BP 110/60 (BP Location: Right Arm, Patient Position: Sitting, Cuff Size: Large)   Pulse 86   Ht 5' 4.5" (1.638 m)   Wt 247 lb (112 kg)   SpO2 94%   BMI 41.74 kg/m   General:  NAD.  Accompanied by her husband. Neck: No obvious JVD, though body habitus limits evaluation. Lungs: Mildly diminished throughout without wheezes or crackles. Heart: Regular rate and rhythm without murmurs, rubs, or gallops. Abdomen: Obese but soft and nontender. Extremities: Trace ankle edema.   Lab Results  Component Value Date   WBC 13.7 (H) 12/14/2020   HGB 12.5 12/14/2020   HCT 38.3 12/14/2020   MCV 95.8 12/14/2020   PLT 304 12/14/2020    Lab Results  Component Value Date   NA 137 12/14/2020   K 3.2 (L) 12/14/2020   CL 99 12/14/2020   CO2 26 12/14/2020   BUN 41 (H) 12/14/2020   CREATININE 1.64 (H) 12/14/2020   GLUCOSE 200 (H) 12/14/2020   ALT 12 07/17/2020    Lab Results  Component Value Date   CHOL 134 05/24/2020   HDL 60.40 05/24/2020   LDLCALC 48 05/24/2020   LDLDIRECT 111.0 06/16/2015   TRIG 128.0 05/24/2020   CHOLHDL 2 05/24/2020    --------------------------------------------------------------------------------------------------  ASSESSMENT AND PLAN: Chronic HFrEF: Unfortunately, Ms. Vore continues to be quite symptomatic with NYHA class III heart failure symptoms.  We have struggled  diuresing her due to worsening renal function.  Her mildly elevated left and severely elevated right heart filling pressures as well as reduced Fick cardiac output.  I worry that right heart failure is dominating her symptoms and that she may have an element of cardiorenal syndrome.  Her weight is stable today with minimal edema on exam.  For now, I will have her hold metolazone and torsemide.  We will recheck a BMP today as well as a CBC and BNP.  We will contact the heart failure clinic in [place] to try to schedule evaluation as soon as possible.  Ms. Jhaveri should let us know if her symptoms worsen.  If that is the case, she may need to be hospitalized with repeat right heart catheterization to help guide diuresis plus minus inotrope therapy to help support her right heart.  Coronary artery disease: Ms. Legere notes a few fleeting chest pains as well as aforementioned shortness of breath.  Given that catheterization earlier this year showed nonobstructive disease with patent LAD stents, I do not think that myocardial ischemia is driving her symptoms.  We will continue current medications for secondary prevention.  She is approaching the 69-month mark from her PCI to the LAD in the setting of unstable angina.  At follow-up, we will need to discuss transitioning to single antiplatelet therapy.  PSVT: Minimal palpitations noted since prior visit.  We discussed discontinuing metoprolol but will continue this for now.  Morbid obesity: I suspect that obesity and deconditioning are playing a role in Ms. Funari's shortness of breath, though she is finding it difficult to participate in any regular activities, including water aerobics.  Follow-up: Return to clinic in 1 month.  [name], MD 12/15/2020 3:27 PM

## 2020-12-14 NOTE — Patient Instructions (Addendum)
Medication Instructions:  Your physician has recommended you make the following change in your medication:   HOLD Metolazone until you hear back from our office  *If you need a refill on your cardiac medications before your next appointment, please call your pharmacy*   Lab Work:  -  Your physician recommends that you have lab work TODAY at the [facility] at [facility]: BMET, CBC, BNP  -  Please go to the [facility]. You will check in at the front desk to the right as you walk into the atrium.   Testing/Procedures:  None ordered   Follow-Up: At [facility], you and your health needs are our priority.  As part of our continuing mission to provide you with exceptional heart care, we have created designated Provider Care Teams.  These Care Teams include your primary Cardiologist (physician) and Advanced Practice Providers (APPs -  Physician Assistants and Nurse Practitioners) who all work together to provide you with the care you need, when you need it.  We recommend signing up for the patient portal called "MyChart".  Sign up information is provided on this After Visit Summary.  MyChart is used to connect with patients for Virtual Visits (Telemedicine).  Patients are able to view lab/test results, encounter notes, upcoming appointments, etc.  Non-urgent messages can be sent to your provider as well.   To learn more about what you can do with MyChart, go to [email or web address].    Your next appointment:   1 month(s)  The format for your next appointment:   In Person  Provider:   You may see [name], MD or one of the following Advanced Practice Providers on your designated Care Team:    Murray Hodgkins, NP  Christell Faith, PA-C  Marrianne Mood, PA-C  Cadence Kathlen Mody, [place]  Laurann [place], NP    Other Instructions  Your heart failure clinic referral has been scheduled for: 01/11/21 @ 10:00 AM (see details below)

## 2020-12-15 ENCOUNTER — Telehealth: Payer: Self-pay | Admitting: *Deleted

## 2020-12-15 ENCOUNTER — Encounter: Payer: Self-pay | Admitting: Internal Medicine

## 2020-12-15 DIAGNOSIS — I5032 Chronic diastolic (congestive) heart failure: Secondary | ICD-10-CM

## 2020-12-15 DIAGNOSIS — I471 Supraventricular tachycardia: Secondary | ICD-10-CM | POA: Insufficient documentation

## 2020-12-15 DIAGNOSIS — Z79899 Other long term (current) drug therapy: Secondary | ICD-10-CM

## 2020-12-15 MED ORDER — TORSEMIDE 10 MG PO TABS: 10.0000 mg | ORAL_TABLET | Freq: Every day | ORAL | Status: DC | PRN

## 2020-12-15 NOTE — Telephone Encounter (Signed)
-----   Message from [name], MD sent at 12/15/2020 10:47 AM EDT ----- Please let Denise Sharp know that her kidney function is still abnormal but a little better compared with last month.  She has a mildly elevated white blood cell count, which is nonspecific but could be due to an underlying infection.  Her BNP is normal, suggesting that she does not have acute heart failure at this time.  I recommend continuing to hold metolazone at this time.  She should use torsemide 10 mg daily only as needed for weight gain (>2 pounds in 1 day or 5 pounds in 1 week) or worsening leg swelling.  Her potassium is a little low; she should continue taking potassium chloride 20 mEq daily.  We should repeat a BMP in ~2 weeks.  She should f/u with [place] and the HF clinic as scheduled.  I will forward the results to [name] for her review as well.

## 2020-12-15 NOTE — Telephone Encounter (Signed)
Spoke to pt. Notified of lab results and provider's recc.  Pt voiced understanding. She will:  - Continue to HOLD Metolazone  - Take Torsemide 10mg  daily only AS NEEDED for wt gain >2lbs in 1 day or 5lbs in 1 week, or worsening leg swelling.  - Continue taking Potassium 53mEq daily  - Repeat BMET in 2 weeks at the [facility]  - Follow up with HF clinic and our office as scheduled.   Pt states that she has plenty of Torsemide 10mg  on hand and does not need refills at this time.  Pt will also follow up with [name] regarding elevated WBC count.   Med list updated.  Lab orders placed.  Pt has no questions at this time.

## 2020-12-16 ENCOUNTER — Other Ambulatory Visit: Payer: Self-pay | Admitting: Internal Medicine

## 2020-12-19 ENCOUNTER — Telehealth: Payer: Self-pay | Admitting: Internal Medicine

## 2020-12-19 NOTE — Telephone Encounter (Signed)
Spoke to pt. Has wt gain of 5lbs since last Friday 6/3 and incr swelling in feet. Denies shortness of breath.  Per lab results on 6/2 Dr. Darnelle Bos plan below:  "I recommend continuing to hold metolazone at this time.  She should use torsemide 10 mg daily only as needed for weight gain (>2 pounds in 1 day or 5 pounds in 1 week) or worsening leg swelling.  Her potassium is a little low; she should continue taking potassium chloride 20 mEq daily.   Pt was given this recc on 6/2 but pt states she misunderstood and took Metoprolol instead of Torsemide 10mg  AS NEEDED per wt instructions.  Pt has not taken Torsemide since weight gain.   Advised pt to take Torsemide 10mg  now and recheck weight tomorrow morning. Follow PRN instructions until return to baseline weight.  Advised pt to call and let me know if by Wednesday morning her wt has not returned to baseline with extra Torsemide.  Pt voiced understanding and has no further questions.

## 2020-12-19 NOTE — Telephone Encounter (Signed)
Pt c/o swelling: STAT is pt has developed SOB within 24 hours  1. If swelling, where is the swelling located? Feet  2. How much weight have you gained and in what time span? 4.4 lbs from 6/4-6/5, down 1 lb on 6/6  3. Have you gained 3 pounds in a day or 5 pounds in a week? yes  4. Do you have a log of your daily weights (if so, list)?  6/6  250.2 6/5  251.2 6/4  246.8      6/3  245.4 6/2  252(in office)244 at home 6/1  244.8 5/31 244.6  5. Are you currently taking a fluid pill? Yes, took on Saturday and Sunday   6. Are you currently SOB? Not at this time  7. Have you traveled recently? No

## 2020-12-21 ENCOUNTER — Other Ambulatory Visit: Payer: Self-pay

## 2020-12-21 ENCOUNTER — Encounter: Payer: Self-pay | Admitting: Podiatry

## 2020-12-21 ENCOUNTER — Ambulatory Visit (INDEPENDENT_AMBULATORY_CARE_PROVIDER_SITE_OTHER): Payer: Medicare Other | Admitting: Podiatry

## 2020-12-21 DIAGNOSIS — E1149 Type 2 diabetes mellitus with other diabetic neurological complication: Secondary | ICD-10-CM

## 2020-12-21 DIAGNOSIS — M79676 Pain in unspecified toe(s): Secondary | ICD-10-CM | POA: Diagnosis not present

## 2020-12-21 DIAGNOSIS — D689 Coagulation defect, unspecified: Secondary | ICD-10-CM

## 2020-12-21 DIAGNOSIS — I25119 Atherosclerotic heart disease of native coronary artery with unspecified angina pectoris: Secondary | ICD-10-CM

## 2020-12-21 DIAGNOSIS — B351 Tinea unguium: Secondary | ICD-10-CM | POA: Diagnosis not present

## 2020-12-21 NOTE — Progress Notes (Signed)
She presents today chief complaint of painfully elongated toenails.  Objective: Toenails are long thick yellow dystrophic-like mycotic painful palpation.  No open lesions or wounds.  Assessment: Onychomycosis.  Plan: Debridement of toenails 1 through 5 bilateral.

## 2020-12-22 NOTE — Telephone Encounter (Signed)
Spoke to pt. Notified of Dr. Darnelle Bos recc.  Pt verbalized understanding.  She will take Torsemide 20mg  daily x 2 days, then 10mg  daily thereafter until weight returns to baseline.  Pt will have repeat BMET either Monday or Tuesday of next week.  Pt has no further questions at this time.

## 2020-12-22 NOTE — Telephone Encounter (Signed)
Please have Ms. Argueta take torsemide 20 mg daily x 2 days, followed by 10 mg daily thereafter until weight has returned to baseline.  She should have a BMP done early next week.  [name], MD [facility]

## 2020-12-22 NOTE — Telephone Encounter (Signed)
Patient calling to update and see if she should continue medication  Please call to discuss

## 2020-12-22 NOTE — Telephone Encounter (Signed)
Spoke to pt. Please also see conversation below from 6/6.  Reports wt not decr since 2 doses Torsemide 10mg  earlier this week.  Swelling in feet HAVE resolved completely. Denies shortness of breath.   Wt at home on 6/2 244lbs Today 6/9 wt at home 251.2lbs.  Pt will take Torsemide 10mg  today d/t PRN instructions to take for 5lb wt gain in 1 week.  She also took Torsemide both Monday and Tuesday this week. With no improvement of wt, only swelling improved.   Pt also reports she took SL Nitroglycerin twice this week. Chest pain resolved after one dose both times.  Pt reports "I don't have typical chest pain, I can feel my heart continuing to work harder and it causes pain."   Pt does report having diarrhea last night, unsure of cause. She is afebrile.  Diarrhea has subsided.  Pt confirms monitoring sodium intake strictly.   She will repeat BMET next week 6/16.  Pt wanted to give update of s/s and to make sure she is not taking too much Torsemide since wt is not decreasing after 2 doses. Notified pt I will make Dr. Saunders Revel aware and call her back with further recc.

## 2020-12-23 ENCOUNTER — Ambulatory Visit (INDEPENDENT_AMBULATORY_CARE_PROVIDER_SITE_OTHER): Payer: Medicare Other | Admitting: *Deleted

## 2020-12-23 DIAGNOSIS — I5032 Chronic diastolic (congestive) heart failure: Secondary | ICD-10-CM | POA: Diagnosis not present

## 2020-12-23 DIAGNOSIS — E1140 Type 2 diabetes mellitus with diabetic neuropathy, unspecified: Secondary | ICD-10-CM

## 2020-12-23 NOTE — Chronic Care Management (AMB) (Signed)
Chronic Care Management   CCM RN Visit Note  12/23/2020 Name: Denise Sharp MRN: [id number] DOB: May 28, 1946  Subjective: Denise Sharp is a 75 y.o. year old female who is a primary care patient of [name], Aris Everts, MD. The care management team was consulted for assistance with disease management and care coordination needs.    Engaged with patient by telephone for follow up visit in response to provider referral for case management and/or care coordination services.   Consent to Services:  The patient was given information about Chronic Care Management services, agreed to services, and gave verbal consent prior to initiation of services.  Please see initial visit note for detailed documentation.   Patient agreed to services and verbal consent obtained.   Assessment: Review of patient past medical history, allergies, medications, health status, including review of consultants reports, laboratory and other test data, was performed as part of comprehensive evaluation and provision of chronic care management services.   SDOH (Social Determinants of Health) assessments and interventions performed:    CCM Care Plan  Allergies  Allergen Reactions   Demeclocycline Hives   Erythromycin Nausea And Vomiting and Other (See Comments)    Severe irritable bowel   Flagyl [Metronidazole] Nausea And Vomiting and Other (See Comments)    Severe irritable bowel   Glucophage [Metformin Hcl] Nausea And Vomiting and Other (See Comments)    "Sick" "I won't take anything that has metformin in it"   Tetracyclines & Related Hives and Rash   Diovan [Valsartan] Nausea Only        Sulfa Antibiotics Rash and Other (See Comments)    As child   Xanax [Alprazolam] Other (See Comments)    Unknown reaction    Outpatient Encounter Medications as of 12/23/2020  Medication Sig   insulin aspart (NOVOLOG FLEXPEN) 100 UNIT/ML FlexPen Inject 8 units up to 3 times daily before meals   insulin glargine (LANTUS  SOLOSTAR) 100 UNIT/ML Solostar Pen Inject 15 Units into the skin daily.   torsemide (DEMADEX) 10 MG tablet Take 1 tablet (10 mg total) by mouth daily as needed (for weight gain greater than 2 lbs in 1 day or 5 lbs in 1 week or worsening leg swelling).   albuterol (VENTOLIN HFA) 108 (90 Base) MCG/ACT inhaler Inhale 2 puffs into the lungs every 8 (eight) hours as needed for wheezing or shortness of breath.   aspirin EC 81 MG tablet Take 1 tablet (81 mg total) by mouth daily.   atorvastatin (LIPITOR) 40 MG tablet Take 1 tablet (40 mg total) by mouth daily.   buPROPion (WELLBUTRIN) 75 MG tablet Take 1 tablet (75 mg total) by mouth 2 (two) times daily.   butalbital-acetaminophen-caffeine (FIORICET) 50-325-40 MG tablet TAKE 1 TABLET BY MOUTH EVERY 6 HOURS AS NEEDED   citalopram (CELEXA) 10 MG tablet Take 1 tablet (10 mg total) by mouth daily.   clopidogrel (PLAVIX) 75 MG tablet Take 1 tablet (75 mg total) by mouth daily.   clotrimazole-betamethasone (LOTRISONE) cream Apply 1 application topically 2 (two) times daily as needed.   Continuous Blood Gluc Receiver (FREESTYLE LIBRE 2 READER) DEVI Use to check sugar at least TID   cyanocobalamin (,VITAMIN B-12,) 1000 MCG/ML injection Inject 1 mL (1,000 mcg total) into the muscle every 30 (thirty) days. Inject 1 ml (1000 mcg ) IM weekly x 4,  Then monthly thereafter   dapagliflozin propanediol (FARXIGA) 10 MG TABS tablet Take 1 tablet (10 mg total) by mouth daily before breakfast.   diazepam (  VALIUM) 5 MG tablet TAKE 1 TABLET (5 MG TOTAL) BY MOUTH AT BEDTIME AS NEEDED FOR ANXIETY.   diphenhydrAMINE (BENADRYL) 25 MG tablet Take 50 mg by mouth daily as needed for allergies.   diphenoxylate-atropine (LOMOTIL) 2.5-0.025 MG tablet Take 1 tablet by mouth 4 (four) times daily as needed for diarrhea or loose stools.   Eszopiclone 3 MG TABS Take 1 tablet (3 mg total) by mouth at bedtime.   fexofenadine (ALLEGRA) 180 MG tablet Take 180 mg by mouth daily.   hyoscyamine  (ANASPAZ) 0.125 MG TBDP disintergrating tablet DISSOLVE UNDER TONGUE 15 MINUTES BEFORE MEALS   Insulin Pen Needle (DROPLET PEN NEEDLES) 31G X 5 MM MISC USE ONE NEEDLE SUBCUTANEOUSLY AS DIRECTED (REMOVE AND DISCARD NEEDLE IN SHARPS CONTAINER IMMEDIATELY AFTER USE)   ipratropium (ATROVENT) 0.06 % nasal spray Place 2 sprays into both nostrils 4 (four) times daily as needed for rhinitis.   loperamide (IMODIUM) 2 MG capsule Take by mouth as needed.   metoprolol tartrate (LOPRESSOR) 25 MG tablet Take 0.5 tablets (12.5 mg total) by mouth 2 (two) times daily.   nitroGLYCERIN (NITROSTAT) 0.4 MG SL tablet Place 1 tablet (0.4 mg total) under the tongue every 5 (five) minutes as needed for chest pain.   NONFORMULARY OR COMPOUNDED [place]  Combination Pain Cream -  Baclofen 2%, Doxepin 5%, Gabapentin 6%, Topiramate 2%, Pentoxifylline 3% Apply 1-2 grams to affected area 3-4 times daily Qty. 120 gm 3 refills   nystatin (MYCOSTATIN/NYSTOP) powder Apply 1 g topically 4 (four) times daily as needed (rash).   potassium chloride SA (KLOR-CON) 20 MEQ tablet Take 1 tablet (20 mEq total) by mouth daily.   pregabalin (LYRICA) 50 MG capsule Take 1 capsule (50 mg total) by mouth 2 (two) times daily.   traMADol (ULTRAM) 50 MG tablet Take 0.5 tablets (25 mg total) by mouth every 12 (twelve) hours as needed for severe pain.   umeclidinium-vilanterol (ANORO ELLIPTA) 62.5-25 MCG/INH AEPB Inhale 1 puff into the lungs daily.   No facility-administered encounter medications on file as of 12/23/2020.    Patient Active Problem List   Diagnosis Date Noted   PSVT (paroxysmal supraventricular tachycardia) ([place]) 12/15/2020   Acute renal failure (ARF) ([place]) 12/09/2020   Temporal arteritis syndrome ([place]) 11/17/2020   Other pulmonary embolism with acute cor pulmonale, unspecified chronicity ([place]) 11/17/2020   Mild episode of recurrent major depressive disorder ([place]) 11/17/2020   Hyperlipidemia associated with type 2  diabetes mellitus ([place]) 08/21/2020   Iron deficiency 06/28/2020   Skin lesion of chest wall 06/10/2020   Elevated alkaline phosphatase level 06/10/2020   Coagulation defect ([place]) 06/06/2020   Chronic heart failure with preserved ejection fraction (HFpEF) ([place]) 01/14/2020   Hyperlipidemia LDL goal <70 01/14/2020   Coronary artery disease involving native coronary artery of native heart without angina pectoris 01/13/2020   S/P cardiac catheterization 01/13/2020   Accelerating angina ([place]) 12/31/2019   Dysphagia 11/30/2019   Bladder pain 08/23/2019   Gastritis 08/07/2019   Lab test negative for COVID-19 virus 08/07/2019   S/P panniculectomy 06/16/2019   Major depressive disorder with current active episode 05/30/2019   Exposure to COVID-19 virus 12/25/2018   Functional diarrhea 10/22/2018   Bilateral cataracts 06/26/2018   Status post bariatric surgery 02/05/2018   GAD (generalized anxiety disorder) 02/01/2018   Rectocele 01/24/2018   Vaginal prolapse 01/04/2018   Hepatic steatosis 01/04/2018   Microalbuminuria due to type 2 diabetes mellitus ([place]) 11/21/2017   Hospital discharge follow-up 09/22/2017   Hypocalcemia  09/22/2017   Hypotension 09/22/2017   Fecal incontinence 09/22/2017   B12 deficiency 09/22/2017   Transient disorientation 08/20/2017   Rotator cuff arthropathy, right 08/13/2017   Primary localized osteoarthrosis of shoulder 08/13/2017   Encounter for preoperative examination for general surgical procedure 08/03/2017   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 ([place]) 08/03/2017   Candidiasis, intertrigo 08/03/2017   Venous stasis dermatitis of right lower extremity 11/21/2016   Edema 12/13/2015   Hypersomnia, persistent 06/18/2015   Mechanical breakdown of implanted electronic neurostimulator of peripheral nerve ([place]) 10/16/2014   Obesity hypoventilation syndrome ([place]) 08/13/2014   Frequent falls 06/23/2014   Chronic cough 05/05/2014   Adenoma of left  adrenal gland 03/24/2014   New onset of headaches after age 50 03/23/2014   Vitamin D deficiency 01/06/2014   Weight gain following gastric bypass surgery 12/03/2013   Morbid obesity ([place]) 08/25/2013   Diaphragmatic hernia 04/06/2013   Diabetic polyneuropathy ([place]) 03/25/2013   Abnormality of gait 03/25/2013   Multiple pulmonary nodules 03/20/2013   DOE (dyspnea on exertion) 03/20/2013   Iron deficiency anemia 08/06/2012   Functional disorder of bladder 07/15/2012   Incomplete bladder emptying 07/15/2012   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[place]) 07/15/2012   Urge incontinence 07/15/2012   Increased frequency of urination 07/15/2012   OSA on CPAP 03/02/2012   Insomnia secondary to anxiety 02/29/2012   Sciatica 09/05/2011   Cervical stenosis of spinal canal 06/14/2011   Restless legs syndrome 03/13/2011   Essential hypertension 03/12/2011   Type 2 diabetes mellitus with diabetic neuropathy, unspecified ([place]) 03/12/2011   Hearing loss, right 03/12/2011    Conditions to be addressed/monitored:CHF and DMII  Care Plan : Heart Failure (Adult)  Updates made by Leona Singleton, RN since 12/23/2020 12:00 AM     Problem: Symptom Exacerbation (Heart Failure)   Priority: Medium     Long-Range Goal: Symptom Exacerbation Prevented or Minimized   Start Date: 10/19/2020  Expected End Date: 04/14/2021  This Visit's Progress: Not on track  Recent Progress: On track  Priority: Medium  Note:   Current Barriers:  Knowledge deficit related to basic heart failure pathophysiology and self care management patient reporting she continues to have shortness of breath, as normal and some chest pains.  States she has needed to take NTG twice in the last 2 weeks.  Reports some swelling in her abdomen and firmness; but feels she may be constipated also.  Continues to weigh daily.  Weight is increased to 251.2 pounds (2 pounds higher than yesterday).  States she has been communicating with Cardiology for dosing  of fluid medication, Demadex (currently taking 10 mg daily).  Continues to adhere to 2 liters per day fluid restriction. Case Manager Clinical Goal(s):  patient will weigh self daily and record patient will verbalize understanding of Heart Failure Action Plan and when to call doctor patient will take all Heart Failure mediations as prescribed patient will weigh daily and record (notifying MD of 3 lb weight gain over night or 5 lb in a week) Interventions:  Collaboration with [name], MD regarding development and update of comprehensive plan of care as evidenced by provider attestation and co-signature Inter-disciplinary care team collaboration (see longitudinal plan of care) Basic overview and discussion of pathophysiology of Heart Failure reviewed  Provided verbal education on low sodium diet Provided education about placing scale on hard, flat surface and advised patient to weigh each morning after emptying bladder Discussed importance of daily weight and advised patient to weigh and record  daily Reviewed role of diuretics in prevention of fluid overload and management of heart failure; encouraged to monitor urine output with reduced dose and weight and to notify provider for increased weight or decreased output Barriers to lifestyle changes reviewed and addressed and barriers to treatment reviewed and addressed Medication-adherence assessment completed and encouraged Rescue (action) plan reviewed Self-awareness of signs/symptoms of worsening disease encouraged Fall precautions and preventions reviewed and discussed Discussed urinary incontinence and possible need for bedside commode Encouraged patient to contact Urology at [facility] to discuss bladder interstim implant and increase in incontinence Encouraged to increase activity or exercise based on tolerance and encouraged to pace activity allowing for rest Reviewed foods high in sodium, discussed eating fresh fruits and vegetables and  avoiding canned and processed foods, encouraged to continue to read food labels Discussed and instructed If weight continues to be up and abdomen is distended, please contact Cardiology by Monday 12/26/20 Patient Goals/Self-Care Activities: Call office if I gain more than 2 pounds in one day or 5 pounds in one week Watch for swelling in feet, ankles and legs every day Weigh myself daily, first thing in morning after emptying bladder Record weighs in log for provider review Eat fresh or frozen vegetables, avoid canned and processed foods Monitor urine output with decrease dose of fluid medication If weight continues to be up and abdomen is distended, please contact Cardiology by Monday 12/26/20 Follow Up Plan: The care management team will reach out to the patient again over the next 20 business days.       Care Plan : Diabetes Type 2 (Adult)  Updates made by Leona Singleton, RN since 12/23/2020 12:00 AM     Problem: Glycemic Management (Diabetes, Type 2)   Priority: High     Long-Range Goal: Patient will report no sustained fasting blood sugars greater than 200 within the next 60 days   Start Date: 10/19/2020  Expected End Date: 04/14/2021  This Visit's Progress: Not on track  Recent Progress: Not on track  Priority: High  Note:   Objective:  Lab Results  Component Value Date   HGBA1C 8.2 (A) 11/15/2020  Current Barriers:  Knowledge Deficits related to basic Diabetes pathophysiology and self care/management as evidenced by patient 's continued elevated blood sugars.  Fasting blood sugar this morning was 136 with recent ranges of 140's this week; ranges of 200-300's weeks before.  States she is unsure why blood sugars continue to be elevated, she does not feel like it is something she is eating.  Denies any hypoglycemic episodes or any recent change in diet.  Denies any falls since Saturday on 01/03/21, sliding off a stool.  Patient is concerned about lots of her medications affecting her  kidneys and would like to speak with provider about medication review and posible discontinuation of some of her medications. Case Manager Clinical Goal(s):  patient will demonstrate improved adherence to prescribed treatment plan for diabetes self care/management as evidenced by: 3 times a day monitoring and recording of CBG,  adherence to ADA/ carb modified diet, adherence to prescribed medication regimen, contacting provider for new or worsened symptoms or questions Interventions:  Collaboration with [name], MD regarding development and update of comprehensive plan of care as evidenced by provider attestation and co-signature Inter-disciplinary care team collaboration (see longitudinal plan of care) Provided education to patient about basic DM disease process Reviewed medications with patient and discussed importance of medication adherence Discussed plans with patient for ongoing care management follow up and  provided patient with direct contact information for care management team Provided patient with educational materials related to hypo and hyperglycemia and importance of correct treatment Advised patient, providing education and rationale, to check cbg 3 times a day and record, calling CCM Pharmacist or PCP for findings outside established parameters.   Barriers to adherence to treatment plan identified Blood glucose readings reviewed and discussed ways to help lower ranges Resources required to improve adherence to care identified Self-awareness of signs/symptoms of hypo or hyperglycemia encouraged Use of blood glucose monitoring log promoted Discussed and reviewed healthier low carbohydrate food and drink options Confirmed patient has not had yearly eye exam and encouraged patient to schedule as soon as possible Encouraged to continue to work with CCM Pharmacist; encouraged patient to contact CCM Pharmacist for sustained elevations Encouraged to attend all scheduled provider  appointments  Discussed and encouraged to adhere to prescribed ADA/carb modified Assessed barriers to management plan, such as food insecurity, age, developmental ability, depression, anxiety, fear of hypoglycemia or weight gain, as well as medication cost, side effects and complicated regimen.  Discussed continuous blood sugar monitoring system and need to scan multiple times a day to check blood sugars Fall precautions and preventions reviewed and discussed; encouraged to use walker with all ambulation; Discuss home health therapy/nursing related to increase in falls; patient declines referral at this time Encouraged patient to really consider retirement community/assisted living facility for safety Long discussion with patient about medications and encouraged patient to discuss all medications with provider and CCM Pharmacist; RNCM will send message to CCM Pharmacist and PCP that patient is wanting medication review and possible weaning off of some medications Patient Goals/Self-Care Activities: Check blood sugar at least 3 times a day Check blood sugar if I feel it is too high or too low Enter blood sugar readings and medication/insulin into daily log for provider review Schedule/attend yearly diabetic eye exam Contact CCM Pharmacist or PCP if blood sugars sustained over 250 consistently Try to avoid drinking sweet tea with regular sugar Use walker or wheelchair for all ambulation Increase activity as tolerated; Fall precautions and preventions Please consider Home Health Physical Therapy Follow Up Plan: The care management team will reach out to the patient again over the next 20 business days.       Plan:The care management team will reach out to the patient again over the next 20 business days.  Hubert Azure RN, MSN RN Care Management Coordinator [place] [phone] [email or web address]

## 2020-12-23 NOTE — Patient Instructions (Signed)
Visit Information  PATIENT GOALS:  Goals Addressed             This Visit's Progress    (RNCM) Monitor and Manage My Blood Sugar-Diabetes Type 2   Not on track    Timeframe:  Long-Range Goal Priority:  High Start Date:     10/19/20                      Expected End Date:   06/14/21                    Follow Up Date 01/09/21    Check blood sugar at least 3 times a day Check blood sugar if I feel it is too high or too low Enter blood sugar readings and medication/insulin into daily log for provider review Schedule/attend yearly diabetic eye exam Contact CCM Pharmacist or PCP if blood sugars sustained over 250 consistently Try to avoid drinking sweet tea with regular sugar Use walker or wheelchair for all ambulation Increase activity as tolerated; Fall precautions and preventions Please consider Home Health Physical Therapy  Why is this important?   Checking your blood sugar at home helps to keep it from getting very high or very low.  Writing the results in a diary or log helps the doctor know how to care for you.  Your blood sugar log should have the time, date and the results.  Also, write down the amount of insulin or other medicine that you take.  Other information, like what you ate, exercise done and how you were feeling, will also be helpful.     Notes:       (RNCM) Track and Manage Fluids and Swelling-Heart Failure   Not on track    Timeframe:  Long-Range Goal Priority:  Medium Start Date: 10/19/20                            Expected End Date:    06/14/21                   Follow Up Date 01/09/21    Call office if I gain more than 2 pounds in one day or 5 pounds in one week Watch for swelling in feet, ankles and legs every day Weigh myself daily, first thing in morning after emptying bladder Record weighs in log for provider review Eat fresh or frozen vegetables, avoid canned and processed foods Monitor urine output with decrease dose of fluid medication If weight  continues to be up and abdomen is distended, please contact Cardiology by Monday 12/26/20   Why is this important?   It is important to check your weight daily and watch how much salt and liquids you have.  It will help you to manage your heart failure.    Notes:          Patient verbalizes understanding of instructions provided today and agrees to view in [place].   The care management team will reach out to the patient again over the next 20 business days.   Hubert Azure RN, MSN RN Care Management Coordinator [place] [phone] [email or web address]

## 2020-12-26 ENCOUNTER — Other Ambulatory Visit
Admission: RE | Admit: 2020-12-26 | Discharge: 2020-12-26 | Disposition: A | Discharge status: Home / Self Care | Payer: Medicare Other | Source: Ambulatory Visit | Attending: Internal Medicine | Admitting: Internal Medicine

## 2020-12-26 DIAGNOSIS — N179 Acute kidney failure, unspecified: Secondary | ICD-10-CM

## 2020-12-26 DIAGNOSIS — Z79899 Other long term (current) drug therapy: Secondary | ICD-10-CM | POA: Insufficient documentation

## 2020-12-26 DIAGNOSIS — I5032 Chronic diastolic (congestive) heart failure: Secondary | ICD-10-CM | POA: Diagnosis not present

## 2020-12-26 LAB — BASIC METABOLIC PANEL
Anion gap: 8 (ref 5–15)
BUN: 19 mg/dL (ref 8–23)
CO2: 26 mmol/L (ref 22–32)
Calcium: 8.8 mg/dL — ABNORMAL LOW (ref 8.9–10.3)
Chloride: 106 mmol/L (ref 98–111)
Creatinine, Ser: 1.37 mg/dL — ABNORMAL HIGH (ref 0.44–1.00)
GFR, Estimated: 40 mL/min — ABNORMAL LOW (ref >60)
Glucose, Bld: 182 mg/dL — ABNORMAL HIGH (ref 70–99)
Potassium: 3.9 mmol/L (ref 3.5–5.1)
Sodium: 140 mmol/L (ref 135–145)

## 2020-12-27 ENCOUNTER — Other Ambulatory Visit: Payer: Self-pay | Admitting: *Deleted

## 2020-12-27 DIAGNOSIS — D509 Iron deficiency anemia, unspecified: Secondary | ICD-10-CM

## 2020-12-27 MED ORDER — TORSEMIDE 10 MG PO TABS: 10.0000 mg | ORAL_TABLET | Freq: Every day | ORAL | Status: DC | PRN

## 2020-12-27 NOTE — Telephone Encounter (Signed)
Patient calling  Would like to check the status of lab results  Patient is also 2 pounds up from yesterday and 10.8 pounds up since 06/01 Would like to know what to do with fluid pill Please call to discuss

## 2020-12-27 NOTE — Telephone Encounter (Signed)
Spoke to pt. Notified of lab results below:  End, Sharp Gave, MD  Solmon Ice, RN Please let Denise Sharp know that her kidney function has improved and is actually the best that it has been in several months.  I recommend that she take torsemide 10-20 mg daily as needed for edema/weight gain and follow-upwith US and the heart failure clinic as scheduled.   Pt voiced understanding and will take Torsemide 10-20 mg daily until she returns to baseline weight. Then will follow PRN instructions noted above. Pt will follow up as scheduled 01/11/21 with CHF clinic with our office 01/20/21.  Pt has no further questions at this time.

## 2020-12-27 NOTE — Addendum Note (Signed)
Addended by: Darlyne Russian on: 12/27/2020 03:29 PM   Modules accepted: Orders

## 2020-12-28 ENCOUNTER — Inpatient Hospital Stay: Payer: Medicare Other | Attending: Internal Medicine

## 2020-12-28 ENCOUNTER — Inpatient Hospital Stay: Payer: Medicare Other

## 2020-12-28 ENCOUNTER — Inpatient Hospital Stay (HOSPITAL_BASED_OUTPATIENT_CLINIC_OR_DEPARTMENT_OTHER): Payer: Medicare Other | Admitting: Internal Medicine

## 2020-12-28 DIAGNOSIS — E611 Iron deficiency: Secondary | ICD-10-CM

## 2020-12-28 DIAGNOSIS — E538 Deficiency of other specified B group vitamins: Secondary | ICD-10-CM | POA: Insufficient documentation

## 2020-12-28 DIAGNOSIS — K589 Irritable bowel syndrome without diarrhea: Secondary | ICD-10-CM | POA: Diagnosis not present

## 2020-12-28 DIAGNOSIS — Z7982 Long term (current) use of aspirin: Secondary | ICD-10-CM | POA: Diagnosis not present

## 2020-12-28 DIAGNOSIS — Z9884 Bariatric surgery status: Secondary | ICD-10-CM | POA: Insufficient documentation

## 2020-12-28 DIAGNOSIS — E119 Type 2 diabetes mellitus without complications: Secondary | ICD-10-CM | POA: Diagnosis not present

## 2020-12-28 DIAGNOSIS — K909 Intestinal malabsorption, unspecified: Secondary | ICD-10-CM | POA: Insufficient documentation

## 2020-12-28 DIAGNOSIS — I2510 Atherosclerotic heart disease of native coronary artery without angina pectoris: Secondary | ICD-10-CM | POA: Insufficient documentation

## 2020-12-28 DIAGNOSIS — D509 Iron deficiency anemia, unspecified: Secondary | ICD-10-CM | POA: Diagnosis not present

## 2020-12-28 DIAGNOSIS — Z794 Long term (current) use of insulin: Secondary | ICD-10-CM | POA: Insufficient documentation

## 2020-12-28 DIAGNOSIS — I5032 Chronic diastolic (congestive) heart failure: Secondary | ICD-10-CM | POA: Diagnosis not present

## 2020-12-28 DIAGNOSIS — G629 Polyneuropathy, unspecified: Secondary | ICD-10-CM | POA: Insufficient documentation

## 2020-12-28 DIAGNOSIS — Z8042 Family history of malignant neoplasm of prostate: Secondary | ICD-10-CM | POA: Diagnosis not present

## 2020-12-28 DIAGNOSIS — M129 Arthropathy, unspecified: Secondary | ICD-10-CM | POA: Diagnosis not present

## 2020-12-28 DIAGNOSIS — Z79899 Other long term (current) drug therapy: Secondary | ICD-10-CM | POA: Diagnosis not present

## 2020-12-28 DIAGNOSIS — K222 Esophageal obstruction: Secondary | ICD-10-CM | POA: Diagnosis not present

## 2020-12-28 DIAGNOSIS — I110 Hypertensive heart disease with heart failure: Secondary | ICD-10-CM | POA: Insufficient documentation

## 2020-12-28 DIAGNOSIS — R1310 Dysphagia, unspecified: Secondary | ICD-10-CM | POA: Insufficient documentation

## 2020-12-28 LAB — BASIC METABOLIC PANEL
Anion gap: 9 (ref 5–15)
BUN: 18 mg/dL (ref 8–23)
CO2: 25 mmol/L (ref 22–32)
Calcium: 8.7 mg/dL — ABNORMAL LOW (ref 8.9–10.3)
Chloride: 107 mmol/L (ref 98–111)
Creatinine, Ser: 1.28 mg/dL — ABNORMAL HIGH (ref 0.44–1.00)
GFR, Estimated: 44 mL/min — ABNORMAL LOW (ref >60)
Glucose, Bld: 157 mg/dL — ABNORMAL HIGH (ref 70–99)
Potassium: 4 mmol/L (ref 3.5–5.1)
Sodium: 141 mmol/L (ref 135–145)

## 2020-12-28 LAB — CBC WITH DIFFERENTIAL/PLATELET
Abs Immature Granulocytes: 0.14 10*3/uL — ABNORMAL HIGH (ref 0.00–0.07)
Basophils Absolute: 0.1 10*3/uL (ref 0.0–0.1)
Basophils Relative: 1 %
Eosinophils Absolute: 0.2 10*3/uL (ref 0.0–0.5)
Eosinophils Relative: 1 %
HCT: 36.7 % (ref 36.0–46.0)
Hemoglobin: 11.7 g/dL — ABNORMAL LOW (ref 12.0–15.0)
Immature Granulocytes: 1 %
Lymphocytes Relative: 20 %
Lymphs Abs: 2.2 10*3/uL (ref 0.7–4.0)
MCH: 31 pg (ref 26.0–34.0)
MCHC: 31.9 g/dL (ref 30.0–36.0)
MCV: 97.3 fL (ref 80.0–100.0)
Monocytes Absolute: 0.6 10*3/uL (ref 0.1–1.0)
Monocytes Relative: 6 %
Neutro Abs: 7.8 10*3/uL — ABNORMAL HIGH (ref 1.7–7.7)
Neutrophils Relative %: 71 %
Platelets: 297 10*3/uL (ref 150–400)
RBC: 3.77 MIL/uL — ABNORMAL LOW (ref 3.87–5.11)
RDW: 14.7 % (ref 11.5–15.5)
WBC: 10.9 10*3/uL — ABNORMAL HIGH (ref 4.0–10.5)
nRBC: 0 % (ref 0.0–0.2)

## 2020-12-28 LAB — LACTATE DEHYDROGENASE: LDH: 142 U/L (ref 98–192)

## 2020-12-28 LAB — FERRITIN: Ferritin: 256 ng/mL (ref 11–307)

## 2020-12-28 LAB — IRON AND TIBC
Iron: 48 ug/dL (ref 28–170)
Saturation Ratios: 19 % (ref 10.4–31.8)
TIBC: 253 ug/dL (ref 250–450)
UIBC: 205 ug/dL

## 2020-12-28 MED ORDER — SODIUM CHLORIDE 0.9 % IV SOLN
510.0000 mg | Freq: Once | INTRAVENOUS | Status: AC
  Administered 2020-12-28: 510 mg via INTRAVENOUS
  Filled 2020-12-28: qty 510

## 2020-12-28 MED ORDER — SODIUM CHLORIDE 0.9 % IV SOLN
Freq: Once | INTRAVENOUS | Status: AC
  Filled 2020-12-28: qty 250

## 2020-12-28 NOTE — Patient Instructions (Signed)
[facility]  Discharge Instructions: Thank you for choosing [facility] to provide your oncology and hematology care.  If you have a lab appointment with the [facility], please go directly to the [facility] and check in at the registration area.  Wear comfortable clothing and clothing appropriate for easy access to any Portacath or PICC line.   We strive to give you quality time with your provider. You may need to reschedule your appointment if you arrive late (15 or more minutes).  Arriving late affects you and other patients whose appointments are after yours.  Also, if you miss three or more appointments without notifying the office, you may be dismissed from the clinic at the provider's discretion.      For prescription refill requests, have your pharmacy contact our office and allow 72 hours for refills to be completed.    Today you received the following : feraheme   To help prevent nausea and vomiting after your treatment, we encourage you to take your nausea medication as directed.  BELOW ARE SYMPTOMS THAT SHOULD BE REPORTED IMMEDIATELY: *FEVER GREATER THAN 100.4 F (38 C) OR HIGHER *CHILLS OR SWEATING *NAUSEA AND VOMITING THAT IS NOT CONTROLLED WITH YOUR NAUSEA MEDICATION *UNUSUAL SHORTNESS OF BREATH *UNUSUAL BRUISING OR BLEEDING *URINARY PROBLEMS (pain or burning when urinating, or frequent urination) *BOWEL PROBLEMS (unusual diarrhea, constipation, pain near the anus) TENDERNESS IN MOUTH AND THROAT WITH OR WITHOUT PRESENCE OF ULCERS (sore throat, sores in mouth, or a toothache) UNUSUAL RASH, SWELLING OR PAIN  UNUSUAL VAGINAL DISCHARGE OR ITCHING   Items with * indicate a potential emergency and should be followed up as soon as possible or go to the Emergency Department if any problems should occur.  Please show the CHEMOTHERAPY ALERT CARD or IMMUNOTHERAPY ALERT CARD at check-in to the Emergency Department and triage  nurse.  Should you have questions after your visit or need to cancel or reschedule your appointment, please contact [facility]  [phone] and follow the prompts.  Office hours are 8:00 a.m. to 4:30 p.m. Monday - Friday. Please note that voicemails left after 4:00 p.m. may not be returned until the following business day.  We are closed weekends and major holidays. You have access to a nurse at all times for urgent questions. Please call the main number to the clinic [phone] and follow the prompts.  For any non-urgent questions, you may also contact your provider using MyChart. We now offer e-Visits for anyone 75 and older to request care online for non-urgent symptoms. For details visit [email or web address].   Also download the MyChart app! Go to the app store, search "MyChart", open the app, select [facility], and log in with your MyChart username and password.  Due to Covid, a mask is required upon entering the hospital/clinic. If you do not have a mask, one will be given to you upon arrival. For doctor visits, patients may have 1 support person aged 61 or older with them. For treatment visits, patients cannot have anyone with them due to current Covid guidelines and our immunocompromised population.

## 2020-12-28 NOTE — Progress Notes (Signed)
[facility] NOTE  Patient Care Team: [name], MD as PCP - General (Internal Medicine) End, Harrell Gave, MD as PCP - Cardiology (Cardiology) Chiropractic, Freddi Che as Referring Physician De Hollingshead, [place] (Pharmacist) Leona Singleton, RN as Case Manager  CHIEF COMPLAINTS/PURPOSE OF CONSULTATION:    HEMATOLOGY HISTORY  # IRON DEFICIENT ANEMIA [place] TO [place]; 2018 IV iron infusion [Dr. Grayland Ormond EGD/colonoscopy- ?2021 [Dr.Wohl]; NOV 2021-hemoglobin 10.  Iron saturation 5% ferritin 12.  # GASTRIC BYPASS x2 [0737]; CAD [s/p stent; Dr.Arida]  HISTORY OF PRESENTING ILLNESS:  Denise Sharp 75 y.o.  female iron deficiency secondary gastric bypass is here for follow-up.  Denies any nausea vomiting.  Complains of fatigue.  Chronic diarrhea.  Intermittent dysphagia.Dr.wohl.   Review of Systems  Constitutional:  Positive for malaise/fatigue. Negative for chills, diaphoresis, fever and weight loss.  HENT:  Negative for nosebleeds and sore throat.   Eyes:  Negative for double vision.  Respiratory:  Negative for cough, hemoptysis, sputum production, shortness of breath and wheezing.   Cardiovascular:  Negative for chest pain, palpitations, orthopnea and leg swelling.  Gastrointestinal:  Positive for diarrhea. Negative for abdominal pain, blood in stool, constipation, heartburn, melena, nausea and vomiting.  Genitourinary:  Negative for dysuria, frequency and urgency.  Musculoskeletal:  Positive for back pain. Negative for joint pain.  Skin: Negative.  Negative for itching and rash.  Neurological:  Negative for dizziness, tingling, focal weakness, weakness and headaches.  Endo/Heme/Allergies:  Does not bruise/bleed easily.  Psychiatric/Behavioral:  Negative for depression. The patient is not nervous/anxious and does not have insomnia.    MEDICAL HISTORY:  Past Medical History:  Diagnosis Date  . (HFpEF) heart failure with preserved  ejection fraction ([place])    a. 12/2019 Echo: EF 65-70%, Gr2 DD. No significant valvular dzs.  . Abnormality of gait 03/25/2013  . Adrenal mass, left ([place])   . Anemia    iron deficient post  2 unit txfsn 2009, normal endo/colonoscopy by [facility]  . Arthritis   . CAD (coronary artery disease) with h/o Atypical Chest Pain    a. 04/1986 Cath ([facility]): nl cors, EF 65%; b. 07/2012 Cath [facility]): Diff minor irregs; c. 07/2016 MV [facility]): "Equivocal"; d. 08/2016 Cardiac CT Ca2+ score [facility]): Ca2+ 1062; e. 05/2019 MV: No ischemia. EF 75%; f. 12/2019 PCI: LM nl, LAD 65ost (3.5x12 Resolute DES), 82m (2.75x12 Resolute DES),LCX/RCA nl; g. 08/2020 Cath: patent LAD stents, RCA 40ost, elev RH pressures, EF >65%.  . Cervical spinal stenosis 1994   due to trauma to back ([employer] accident), has intermittent paralysis and parasthesias  . Cervicogenic headache 03/23/2014  . Depression   . Dizziness    chronic dizziness  . DJD (degenerative joint disease)    a. Chronic R shoulder pain pending R shoulder replacement 07/2017.  [name] Esophageal stenosis March 2011   with transietn outlet obstruction by food, cleared by EGD   . Family history of adverse reaction to anesthesia    daughter PONV  . Gastric bypass status for obesity   . Headache(784.0)   . Hypertension   . IBS (irritable bowel syndrome)   . Left bundle branch block (LBBB)    a. Intermittently present - likely rate related. - patient denies  . Obesity   . Obstructive sleep apnea    using CPAP  . 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 Restless leg syndrome   . Rotator cuff arthropathy, right 08/13/2017  . Syncope and collapse 03/12/2014  . Type II diabetes mellitus (  [place])     SURGICAL HISTORY: Past Surgical History:  Procedure Laterality Date  . ABDOMINAL HYSTERECTOMY    . APPENDECTOMY    . CARDIAC CATHETERIZATION    . COLONOSCOPY    . CORONARY STENT INTERVENTION N/A 01/04/2020   Procedure: CORONARY STENT INTERVENTION;  Surgeon: [name], MD;   Location: [facility];  Service: Cardiovascular;  Laterality: N/A;  LAD   . DIAPHRAGMATIC HERNIA REPAIR  2015  . ESOPHAGEAL DILATION     multiple  . ESOPHAGOGASTRODUODENOSCOPY    . [place]  . GALLBLADDER SURGERY  resection  . GASTRIC BYPASS    . GASTRIC BYPASS  2000, 2005   Dr. Debroah Loop  . [facility] IMPLANT PLACEMENT  April 2013   [place]  . JOINT REPLACEMENT  2007   bilateral knee. [name],  [name]  . LEFT HEART CATH AND CORS/GRAFTS ANGIOGRAPHY N/A 12/31/2019   Procedure: LEFT HEART CATH AND CORS/GRAFTS ANGIOGRAPHY;  Surgeon: [name], MD;  Location: [facility];  Service: Cardiovascular;  Laterality: N/A;  . PANNICULECTOMY  06/16/2019  . PANNICULECTOMY N/A 06/16/2019   Procedure: PANNICULECTOMY;  Surgeon: [name], MD;  Location: [facility];  Service: Plastics;  Laterality: N/A;  3 hours, please  . REVERSE SHOULDER ARTHROPLASTY Right 08/13/2017   Procedure: REVERSE RIGHT SHOULDER ARTHROPLASTY;  Surgeon: [name], MD;  Location: [facility];  Service: Orthopedics;  Laterality: Right;  . RIGHT/LEFT HEART CATH AND CORONARY ANGIOGRAPHY N/A 09/06/2020   Procedure: RIGHT/LEFT HEART CATH AND CORONARY ANGIOGRAPHY;  Surgeon: [name], MD;  Location: [facility];  Service: Cardiovascular;  Laterality: N/A;  . ROTATOR CUFF REPAIR     right  . [place]  . TOTAL ABDOMINAL HYSTERECTOMY W/ BILATERAL SALPINGOOPHORECTOMY  1974  . UMBILICAL HERNIA REPAIR  Aug 11, 2015    SOCIAL HISTORY: Social History   Socioeconomic History  . Marital status: Married    Spouse name: [name]   . Number of children: 2  . Years of education: College   . Highest education level: Not on file  Occupational History    Employer: retired  Tobacco Use  . Smoking status: Never  . Smokeless tobacco: Never  Vaping Use  . Vaping Use: Never used  Substance and Sexual Activity  . Alcohol use: No  . Drug use: Yes    Comment: prescribed valium   . Sexual activity: Not Currently    Birth control/protection: Post-menopausal, Surgical  Other Topics Concern  . Not on file  Social History Narrative   Patient lives at home with husband [name].    Right Handed   Drinks caffeinated tea occasionally   Social Determinants of Health   Financial Resource Strain: Low Risk   . Difficulty of Paying Living Expenses: Not hard at all  Food Insecurity: No Food Insecurity  . Worried About Charity fundraiser in the Last Year: Never true  . Ran Out of Food in the Last Year: Never true  Transportation Needs: No Transportation Needs  . Lack of Transportation (Medical): No  . Lack of Transportation (Non-Medical): No  Physical Activity: Insufficiently Active  . Days of Exercise per Week: 2 days  . Minutes of Exercise per Session: 40 min  Stress: Stress Concern Present  . Feeling of Stress : Rather much  Social Connections: Not on file  Intimate Partner Violence: Not At Risk  . Fear of Current or Ex-Partner: No  . Emotionally Abused: No  .  Physically Abused: No  . Sexually Abused: No    FAMILY HISTORY: Family History  Problem Relation Age of Onset  . Heart disease Father   . Hypertension Father   . Prostate cancer Father   . Stroke Father   . Osteoporosis Father   . Stroke Mother   . Depression Mother   . Headache Mother   . Heart disease Mother   . Thyroid disease Mother   . Hypertension Mother   . Diabetes Daughter   . Heart disease Daughter   . Hypertension Daughter   . Hypertension Son     ALLERGIES:  is allergic to demeclocycline, erythromycin, flagyl [metronidazole], glucophage [metformin hcl], tetracyclines & related, diovan [valsartan], sulfa antibiotics, and xanax [alprazolam].  MEDICATIONS:  Current Outpatient Medications  Medication Sig Dispense Refill  . albuterol (VENTOLIN HFA) 108 (90 Base) MCG/ACT inhaler Inhale 2 puffs into the lungs every 8 (eight) hours as needed for wheezing or shortness of breath. 8 g 2  .  aspirin EC 81 MG tablet Take 1 tablet (81 mg total) by mouth daily. 30 tablet 2  . atorvastatin (LIPITOR) 40 MG tablet Take 1 tablet (40 mg total) by mouth daily. 90 tablet 0  . clopidogrel (PLAVIX) 75 MG tablet Take 1 tablet (75 mg total) by mouth daily. 90 tablet 0  . Continuous Blood Gluc Receiver (FREESTYLE LIBRE 2 READER) DEVI Use to check sugar at least TID 1 each 1  . cyanocobalamin (,VITAMIN B-12,) 1000 MCG/ML injection Inject 1 mL (1,000 mcg total) into the muscle every 30 (thirty) days. Inject 1 ml (1000 mcg ) IM weekly x 4,  Then monthly thereafter 3 mL 1  . dapagliflozin propanediol (FARXIGA) 10 MG TABS tablet Take 1 tablet (10 mg total) by mouth daily before breakfast. 90 tablet 1  . diphenoxylate-atropine (LOMOTIL) 2.5-0.025 MG tablet Take 1 tablet by mouth 4 (four) times daily as needed for diarrhea or loose stools. 60 tablet 5  . hyoscyamine (ANASPAZ) 0.125 MG TBDP disintergrating tablet DISSOLVE UNDER TONGUE 15 MINUTES BEFORE MEALS 360 tablet 1  . insulin aspart (NOVOLOG FLEXPEN) 100 UNIT/ML FlexPen Inject 8 units up to 3 times daily before meals 15 mL 11  . insulin glargine (LANTUS SOLOSTAR) 100 UNIT/ML Solostar Pen Inject 15 Units into the skin daily. 15 mL 3  . Insulin Pen Needle (DROPLET PEN NEEDLES) 31G X 5 MM MISC USE ONE NEEDLE SUBCUTANEOUSLY AS DIRECTED (REMOVE AND DISCARD NEEDLE IN SHARPS CONTAINER IMMEDIATELY AFTER USE) 100 each 1  . ipratropium (ATROVENT) 0.06 % nasal spray Place 2 sprays into both nostrils 4 (four) times daily as needed for rhinitis.    . metoprolol tartrate (LOPRESSOR) 25 MG tablet Take 0.5 tablets (12.5 mg total) by mouth 2 (two) times daily. 90 tablet 3  . nystatin (MYCOSTATIN/NYSTOP) powder Apply 1 g topically 4 (four) times daily as needed (rash).    . potassium chloride SA (KLOR-CON) 20 MEQ tablet Take 1 tablet (20 mEq total) by mouth daily. 30 tablet 5  . torsemide (DEMADEX) 10 MG tablet Take 1-2 tablets (10-20 mg total) by mouth daily as needed  (for weight gain greater than 2 lbs in 1 day or 5 lbs in 1 week or worsening leg swelling).    . traMADol (ULTRAM) 50 MG tablet Take 0.5 tablets (25 mg total) by mouth every 12 (twelve) hours as needed for severe pain. 30 tablet 0  . umeclidinium-vilanterol (ANORO ELLIPTA) 62.5-25 MCG/INH AEPB Inhale 1 puff into the lungs daily. 60 each 3  .  clotrimazole-betamethasone (LOTRISONE) cream Apply 1 application topically 2 (two) times daily as needed.    . diphenhydrAMINE (BENADRYL) 25 MG tablet Take 50 mg by mouth daily as needed for allergies.    . fexofenadine (ALLEGRA) 180 MG tablet Take 180 mg by mouth daily.    [name] loperamide (IMODIUM) 2 MG capsule Take by mouth as needed.    . nitroGLYCERIN (NITROSTAT) 0.4 MG SL tablet Place 1 tablet (0.4 mg total) under the tongue every 5 (five) minutes as needed for chest pain. 25 tablet 1  . NONFORMULARY OR COMPOUNDED [place]  Combination Pain Cream -  Baclofen 2%, Doxepin 5%, Gabapentin 6%, Topiramate 2%, Pentoxifylline 3% Apply 1-2 grams to affected area 3-4 times daily Qty. 120 gm 3 refills 1 each 3  . pregabalin (LYRICA) 50 MG capsule TAKE 1 CAPSULE BY MOUTH 2 TIMES DAILY. 180 capsule 1   No current facility-administered medications for this visit.      PHYSICAL EXAMINATION:   Vitals:   12/28/20 1354  BP: 111/67  Pulse: 67  Resp: 20  Temp: 97.8 F (36.6 C)   Filed Weights   12/28/20 1354  Weight: 257 lb 3.2 oz (116.7 kg)    Physical Exam Constitutional:      Comments: Alone; in a wheelchair.  Obese.  HENT:     Head: Normocephalic and atraumatic.     Mouth/Throat:     Pharynx: No oropharyngeal exudate.  Eyes:     Pupils: Pupils are equal, round, and reactive to light.  Cardiovascular:     Rate and Rhythm: Normal rate and regular rhythm.     Pulses: Normal pulses.     Heart sounds: Normal heart sounds.  Pulmonary:     Effort: Pulmonary effort is normal. No respiratory distress.     Breath sounds: Normal breath  sounds. No wheezing.  Abdominal:     General: Bowel sounds are normal. There is no distension.     Palpations: Abdomen is soft. There is no mass.     Tenderness: no abdominal tenderness There is no guarding or rebound.  Musculoskeletal:        General: No tenderness. Normal range of motion.     Cervical back: Normal range of motion and neck supple.  Skin:    General: Skin is warm.  Neurological:     Mental Status: She is alert and oriented to person, place, and time.  Psychiatric:        Mood and Affect: Affect normal.    LABORATORY DATA:  I have reviewed the data as listed Lab Results  Component Value Date   WBC 10.9 (H) 12/28/2020   HGB 11.7 (L) 12/28/2020   HCT 36.7 12/28/2020   MCV 97.3 12/28/2020   PLT 297 12/28/2020   Recent Labs    01/25/20 1203 03/23/20 1204 05/24/20 1532 06/07/20 1546 07/17/20 1150 08/31/20 1307 12/14/20 1714 12/26/20 1306 12/28/20 1324 01/06/21 1135  NA 139 140 140  --  140   < > 137 140 141 140  K 3.9 4.3 3.7  --  3.7   < > 3.2* 3.9 4.0 3.9  CL 107 103 102  --  106   < > 99 106 107 99  CO2 20* 26 24  --  20*   < > 26 26 25 29   GLUCOSE 187* 165* 135*  --  117*   < > 200* 182* 157* 128*  BUN 17 10 9   --  14   < > 41*  19 18 17   CREATININE 1.11* 1.05* 1.20  --  1.03*   < > 1.64* 1.37* 1.28* 1.27*  CALCIUM 8.8* 8.9 8.4  --  9.1   < > 8.6* 8.8* 8.7* 9.6  GFRNONAA 49* 52*  --   --  57*   < > 32* 40* 44*  --   GFRAA 57* >60  --   --   --   --   --   --   --   --   PROT  --   --  6.8 6.9 7.6  --   --   --   --   --   ALBUMIN  --   --  3.6 3.6 3.5  --   --   --   --   --   AST  --   --  12 12 18   --   --   --   --   --   ALT  --   --  10 9 12   --   --   --   --   --   ALKPHOS  --   --  136* 153*  134* 129*  --   --   --   --   --   BILITOT  --   --  0.3 0.3 0.5  --   --   --   --   --   BILIDIR  --   --   --  0.1  --   --   --   --   --   --    < > = values in this interval not displayed.     No results found.  Iron deficiency # Iron  deficiency/secondary to gastric bypass/malabsorption: Symptomatic anemia hemoglobin ferritin 12 saturation 5% hemoglobin 10 [November 2021].  S/p Feraheme infusion hemoglobin today is 11.3.  proceed Feraheme.  # B12 deficiency secondary gastric bypass at home iron injections.  # Fatigue: ? Cardiac/CHF [Dr.End]- on Lasix/ Kdur; ? Cardiac cath.  # Esophagus: dysphagia ? stricture- Awaiting EGD with Dr.Wohl.  Esophagogram not very concerning for malignancy.  # DISPOSITION: # ferrahem today # follow up in 4 months; MD;labs- cbc/bmp;; possible Ferrahem- Dr.B  All questions were answered. The patient knows to call the clinic with any problems, questions or concerns.    Cammie Sickle, MD 01/07/2021 11:48 PM

## 2020-12-28 NOTE — Assessment & Plan Note (Signed)
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  S/p Feraheme infusion hemoglobin today is 11.3.  proceed Feraheme.  # B12 deficiency secondary gastric bypass at home iron injections.  # Fatigue: ? Cardiac/CHF [Dr.End]- on Lasix/ Kdur; ? Cardiac cath.  # Esophagus: dysphagia ? stricture- Awaiting EGD with Dr.Wohl.  Esophagogram not very concerning for malignancy.  # DISPOSITION: # ferrahem today # follow up in 4 months; MD;labs- cbc/bmp;; possible Ferrahem- Dr.B

## 2021-01-02 ENCOUNTER — Telehealth: Payer: Self-pay | Admitting: Internal Medicine

## 2021-01-02 ENCOUNTER — Other Ambulatory Visit: Payer: Self-pay | Admitting: Neurology

## 2021-01-02 NOTE — Telephone Encounter (Signed)
Attempted to call pt. No answer. Lmtcb.   Current PRN instructions are as follows: Torsemide 10-20mg  daily only AS NEEDED for wt gain >2lbs in 1 day or 5lbs in 1 week, or worsening leg swelling.  Notified pt on vm with details of sliding scale instructions above (Ok per DPR to LDM).  Asked pt to call me back so that I may review any further s/s.

## 2021-01-02 NOTE — Telephone Encounter (Signed)
Patient can be scheduled further out, 10 days past 12/30/20 test date with no symptoms. Or we can change her visit to virtual and we can still go over medication in detail and send in any refills needed.

## 2021-01-02 NOTE — Telephone Encounter (Signed)
Lm on patient's voice mail to please call office to setup virtual or schedule 10 days out from last symptoms.

## 2021-01-02 NOTE — Telephone Encounter (Signed)
Patient calling  States that her weight is back up 2.8 pounds  Wants to discuss fluid pill Please call to discuss

## 2021-01-02 NOTE — Telephone Encounter (Signed)
Called patient to confirm her 01/06/21 appointment with Dr [name]. Patient had symptoms last week and tested negative for covid on 12/30/2020. It sounded like patient had nose congestion, but denied it. Patient got very up set when told this appointment would have to be a virtual, per office policy's . She said," NO! I have to come into the office to go over all my medications. What is recommended to do ? Schedule further out?

## 2021-01-03 NOTE — Telephone Encounter (Signed)
Pt called and said that she tested the weekend before the 17th for Covid  Pt said that she does have a cough and congestion but it is due to her having fluid around her heart that has been going on for 3 months plus   Her Blood Sugar is running 350+ and wants and in office appt

## 2021-01-03 NOTE — Telephone Encounter (Signed)
Pt returned call. Discussed weight gain 2.8 lb wt gain in one day.  Pt will take Torsemide 20mg  per sliding scale.  Will continue to follow PRN instructions Torsemide 10-20mg  AS NEEDED detailed below.   Pt with no further questions at this time.

## 2021-01-03 NOTE — Telephone Encounter (Signed)
Spoke with pt and she stated that she is unable to come by today because her husband is gone to a funeral and she stated she is too dizzy to drive herself.

## 2021-01-03 NOTE — Telephone Encounter (Signed)
Pt has been advised by Santiago Glad that appt would need to be changed to a virtual visit or moved out 10 days. Pt originally stated that she tested herself this past Friday but is now stating that it was the Friday before on 12/23/20. Pt did state that she is having cough and congestion due to CHF and it has been going on for 3 months. Does appt need to be virtual?

## 2021-01-04 ENCOUNTER — Other Ambulatory Visit: Payer: Self-pay

## 2021-01-04 ENCOUNTER — Other Ambulatory Visit: Payer: Medicare Other

## 2021-01-04 DIAGNOSIS — R059 Cough, unspecified: Secondary | ICD-10-CM | POA: Diagnosis not present

## 2021-01-04 NOTE — Telephone Encounter (Signed)
Patient scheduled today at 2:15 for PCR Covid. Given this do we need to change to VV for her appointment Friday?

## 2021-01-04 NOTE — Telephone Encounter (Signed)
LMTCB to schedule patient for Covid test today that has been ordered.

## 2021-01-04 NOTE — Telephone Encounter (Signed)
Denise Sharp well tell patient when she is swabbed at office. I have changed appointment to virtual.

## 2021-01-05 LAB — NOVEL CORONAVIRUS, NAA: SARS-CoV-2, NAA: NOT DETECTED

## 2021-01-05 LAB — SARS-COV-2, NAA 2 DAY TAT

## 2021-01-06 ENCOUNTER — Encounter: Payer: Self-pay | Admitting: Internal Medicine

## 2021-01-06 ENCOUNTER — Other Ambulatory Visit: Payer: Self-pay

## 2021-01-06 ENCOUNTER — Ambulatory Visit (INDEPENDENT_AMBULATORY_CARE_PROVIDER_SITE_OTHER): Payer: Medicare Other | Admitting: Internal Medicine

## 2021-01-06 VITALS — BP 126/74 | HR 84 | Temp 95.9°F | Resp 16 | Ht 64.5 in | Wt 253.8 lb

## 2021-01-06 DIAGNOSIS — N183 Chronic kidney disease, stage 3 unspecified: Secondary | ICD-10-CM | POA: Diagnosis not present

## 2021-01-06 DIAGNOSIS — E1140 Type 2 diabetes mellitus with diabetic neuropathy, unspecified: Secondary | ICD-10-CM

## 2021-01-06 DIAGNOSIS — E662 Morbid (severe) obesity with alveolar hypoventilation: Secondary | ICD-10-CM | POA: Diagnosis not present

## 2021-01-06 DIAGNOSIS — R269 Unspecified abnormalities of gait and mobility: Secondary | ICD-10-CM | POA: Diagnosis not present

## 2021-01-06 DIAGNOSIS — N1831 Chronic kidney disease, stage 3a: Secondary | ICD-10-CM | POA: Diagnosis not present

## 2021-01-06 DIAGNOSIS — E1122 Type 2 diabetes mellitus with diabetic chronic kidney disease: Secondary | ICD-10-CM

## 2021-01-06 DIAGNOSIS — I25119 Atherosclerotic heart disease of native coronary artery with unspecified angina pectoris: Secondary | ICD-10-CM

## 2021-01-06 LAB — BASIC METABOLIC PANEL
BUN: 17 mg/dL (ref 6–23)
CO2: 29 mEq/L (ref 19–32)
Calcium: 9.6 mg/dL (ref 8.4–10.5)
Chloride: 99 mEq/L (ref 96–112)
Creatinine, Ser: 1.27 mg/dL — ABNORMAL HIGH (ref 0.40–1.20)
GFR: 41.43 mL/min — ABNORMAL LOW (ref >60.00)
Glucose, Bld: 128 mg/dL — ABNORMAL HIGH (ref 70–99)
Potassium: 3.9 mEq/L (ref 3.5–5.1)
Sodium: 140 mEq/L (ref 135–145)

## 2021-01-06 NOTE — Progress Notes (Signed)
Subjective:  Patient ID: Denise Sharp, female    DOB: May 08, 1946  Age: 75 y.o. MRN: [id number]  CC: The primary encounter diagnosis was Stage 3a chronic kidney disease ([place]). Diagnoses of Abnormality of gait, CKD stage 3 due to type 2 diabetes mellitus ([place]), Type 2 diabetes mellitus with diabetic neuropathy, without long-term current use of insulin ([place]), and Obesity hypoventilation syndrome ([place]) were also pertinent to this visit.  HPI Denise Sharp presents of follow up on extreme fatigue and weakness   This visit occurred during the SARS-CoV-2 public health emergency.  Safety protocols were in place, including screening questions prior to the visit, additional usage of staff PPE, and extensive cleaning of exam room while observing appropriate contact time as indicated for disinfecting solutions.   Denise Sharp is a 75 yr old white female with a history of  CAD s/p AMI in June 2021 with HFpEF by Feb 2022 right/left heart cath,  morbid obesity complicated by OSA and OHS, and chronic insomnia, type 2  DM  who presents with profound fatigue and new onset CKD.  She is here today to review her meds out of concern for their effect on her GFR .  She was recently asked to suspend torsemide due to worsening renal function but it was resumed after a significant weight gain was noted by hardiology.  She gained 12 lbs during a period of torsemide reduction to 10 mg daily .  She  Has been taking torsemide 20 mg daily for the past week  and has lost 5 lbs on 20 mg dose .  She reports "feeling the fluid in my lungs and around my heart" .  However her chest x ray and BNP have been normal . She remains on fluid restriction 2 L by cardiology  Appetite has been poor.   She has multiple complaints today and in general feels lousy:.    Throat hurts.  Needs dental work.   Back hurts bilaterally .  right hip hurts.  Sees orthopedics told she had no hip joint issues   Has OSA.   has been wearing New CPAP  machine prescribed by pulmonary for the past year.  Falls asleep frequently during the day. Too fatigued to consider cardiopulmonary rehab or returning to water aerobics.  Reviewed the diagnosis and symptoms of OBESITY HYPOVENTILATION SYNDROME TO PATIENT   DM TYPE 2:  Report that she eats only a few spoonfuls of soup or salad,  yet evening BS are frequently > 250 and up to 350  .  "I have not given up bread" /. Not cooking due to back pain .  Husband does most of the cooking or they eat out.   Balance getting worse.  Not getting PT in pool currently due to recent call with abrasions . Too weak to get out of the pool.  Too unstable to walk independently once out of the pool.       Outpatient Medications Prior to Visit  Medication Sig Dispense Refill   albuterol (VENTOLIN HFA) 108 (90 Base) MCG/ACT inhaler Inhale 2 puffs into the lungs every 8 (eight) hours as needed for wheezing or shortness of breath. 8 g 2   aspirin EC 81 MG tablet Take 1 tablet (81 mg total) by mouth daily. 30 tablet 2   atorvastatin (LIPITOR) 40 MG tablet Take 1 tablet (40 mg total) by mouth daily. 90 tablet 0   clopidogrel (PLAVIX) 75 MG tablet Take 1 tablet (75 mg total) by mouth  daily. 90 tablet 0   clotrimazole-betamethasone (LOTRISONE) cream Apply 1 application topically 2 (two) times daily as needed.     Continuous Blood Gluc Receiver (FREESTYLE LIBRE 2 READER) DEVI Use to check sugar at least TID 1 each 1   dapagliflozin propanediol (FARXIGA) 10 MG TABS tablet Take 1 tablet (10 mg total) by mouth daily before breakfast. 90 tablet 1   diphenhydrAMINE (BENADRYL) 25 MG tablet Take 50 mg by mouth daily as needed for allergies.     diphenoxylate-atropine (LOMOTIL) 2.5-0.025 MG tablet Take 1 tablet by mouth 4 (four) times daily as needed for diarrhea or loose stools. 60 tablet 5   fexofenadine (ALLEGRA) 180 MG tablet Take 180 mg by mouth daily.     hyoscyamine (ANASPAZ) 0.125 MG TBDP disintergrating tablet DISSOLVE UNDER  TONGUE 15 MINUTES BEFORE MEALS 360 tablet 1   insulin aspart (NOVOLOG FLEXPEN) 100 UNIT/ML FlexPen Inject 8 units up to 3 times daily before meals 15 mL 11   insulin glargine (LANTUS SOLOSTAR) 100 UNIT/ML Solostar Pen Inject 15 Units into the skin daily. 15 mL 3   Insulin Pen Needle (DROPLET PEN NEEDLES) 31G X 5 MM MISC USE ONE NEEDLE SUBCUTANEOUSLY AS DIRECTED (REMOVE AND DISCARD NEEDLE IN SHARPS CONTAINER IMMEDIATELY AFTER USE) 100 each 1   ipratropium (ATROVENT) 0.06 % nasal spray Place 2 sprays into both nostrils 4 (four) times daily as needed for rhinitis.     loperamide (IMODIUM) 2 MG capsule Take by mouth as needed.     metoprolol tartrate (LOPRESSOR) 25 MG tablet Take 0.5 tablets (12.5 mg total) by mouth 2 (two) times daily. 90 tablet 3   nitroGLYCERIN (NITROSTAT) 0.4 MG SL tablet Place 1 tablet (0.4 mg total) under the tongue every 5 (five) minutes as needed for chest pain. 25 tablet 1   NONFORMULARY OR COMPOUNDED ITEM [facility]  Combination Pain Cream -  Baclofen 2%, Doxepin 5%, Gabapentin 6%, Topiramate 2%, Pentoxifylline 3% Apply 1-2 grams to affected area 3-4 times daily Qty. 120 gm 3 refills 1 each 3   nystatin (MYCOSTATIN/NYSTOP) powder Apply 1 g topically 4 (four) times daily as needed (rash).     potassium chloride SA (KLOR-CON) 20 MEQ tablet Take 1 tablet (20 mEq total) by mouth daily. 30 tablet 5   pregabalin (LYRICA) 50 MG capsule TAKE 1 CAPSULE BY MOUTH 2 TIMES DAILY. 180 capsule 1   torsemide (DEMADEX) 10 MG tablet Take 1-2 tablets (10-20 mg total) by mouth daily as needed (for weight gain greater than 2 lbs in 1 day or 5 lbs in 1 week or worsening leg swelling).     traMADol (ULTRAM) 50 MG tablet Take 0.5 tablets (25 mg total) by mouth every 12 (twelve) hours as needed for severe pain. 30 tablet 0   umeclidinium-vilanterol (ANORO ELLIPTA) 62.5-25 MCG/INH AEPB Inhale 1 puff into the lungs daily. 60 each 3   buPROPion (WELLBUTRIN) 75 MG tablet Take 1 tablet (75 mg  total) by mouth 2 (two) times daily. 180 tablet 1   butalbital-acetaminophen-caffeine (FIORICET) 50-325-40 MG tablet TAKE 1 TABLET BY MOUTH EVERY 6 HOURS AS NEEDED 60 tablet 5   citalopram (CELEXA) 10 MG tablet Take 1 tablet (10 mg total) by mouth daily. 90 tablet 1   cyanocobalamin (,VITAMIN B-12,) 1000 MCG/ML injection Inject 1 mL (1,000 mcg total) into the muscle every 30 (thirty) days. Inject 1 ml (1000 mcg ) IM weekly x 4,  Then monthly thereafter 3 mL 1   diazepam (VALIUM) 5 MG tablet TAKE  1 TABLET (5 MG TOTAL) BY MOUTH AT BEDTIME AS NEEDED FOR ANXIETY. (Patient taking differently: Take 5 mg by mouth at bedtime as needed for sedation.) 30 tablet 5   Eszopiclone 3 MG TABS Take 1 tablet (3 mg total) by mouth at bedtime. 90 tablet 1   No facility-administered medications prior to visit.    Review of Systems;  Patient denies headache, fevers, malaise, unintentional weight loss, skin rash, eye pain, sinus congestion and sinus pain,  dysphagia,  hemoptysis , cough,  wheezing, chest pain, palpitations, orthopnea, , abdominal pain, nausea, melena, , constipation, flank pain, dysuria, hematuria, urinary  Frequency, nocturia, numbness, tingling, seizures,  Focal weakness, Loss of consciousness,  Tremor, , depression, anxiety, and suicidal ideation.      Objective:  BP 126/74 (BP Location: Left Arm, Patient Position: Sitting, Cuff Size: Normal)   Pulse 84   Temp (!) 95.9 F (35.5 C) (Temporal)   Resp 16   Ht 5' 4.5" (1.638 m)   Wt 253 lb 12.8 oz (115.1 kg)   SpO2 95%   BMI 42.89 kg/m   BP Readings from Last 3 Encounters:  01/06/21 126/74  12/28/20 111/67  12/14/20 110/60    Wt Readings from Last 3 Encounters:  01/06/21 253 lb 12.8 oz (115.1 kg)  12/28/20 257 lb 3.2 oz (116.7 kg)  12/14/20 247 lb (112 kg)    General appearance: alert, cooperative and appears stated age.  Body habitus with abnormally large midsection  Ears: normal TM's and external ear canals both ears Throat:  lips, mucosa, and tongue normal; teeth and gums normal Neck: no adenopathy, no carotid bruit, supple, symmetrical, trachea midline and thyroid not enlarged, symmetric, no tenderness/mass/nodules Back: symmetric, no curvature. ROM normal. No CVA tenderness. Lungs: clear to auscultation bilaterally Heart: regular rate and rhythm, S1, S2 normal, no murmur, click, rub or gallop Abdomen: soft, non-tender; bowel sounds normal; no masses,  no organomegaly Pulses: 2+ and symmetric Skin: Skin color, texture, turgor normal. No rashes or lesions Lymph nodes: Cervical, supraclavicular, and axillary nodes normal.  Lab Results  Component Value Date   HGBA1C 8.2 (A) 11/15/2020   HGBA1C 8.5 (H) 05/24/2020   HGBA1C 7.6 (H) 01/25/2020    Lab Results  Component Value Date   CREATININE 1.27 (H) 01/06/2021   CREATININE 1.28 (H) 12/28/2020   CREATININE 1.37 (H) 12/26/2020    Lab Results  Component Value Date   WBC 10.9 (H) 12/28/2020   HGB 11.7 (L) 12/28/2020   HCT 36.7 12/28/2020   PLT 297 12/28/2020   GLUCOSE 128 (H) 01/06/2021   CHOL 134 05/24/2020   TRIG 128.0 05/24/2020   HDL 60.40 05/24/2020   LDLDIRECT 111.0 06/16/2015   LDLCALC 48 05/24/2020   ALT 12 07/17/2020   AST 18 07/17/2020   NA 140 01/06/2021   K 3.9 01/06/2021   CL 99 01/06/2021   CREATININE 1.27 (H) 01/06/2021   BUN 17 01/06/2021   CO2 29 01/06/2021   TSH 3.695 07/17/2020   INR 1.1 12/31/2019   HGBA1C 8.2 (A) 11/15/2020   MICROALBUR 2.8 (H) 06/04/2017    No results found.  Assessment & Plan:   Problem List Items Addressed This Visit       Unprioritized   Abnormality of gait (Chronic)    She has a waddling gait due to neuropathy,  Charcot joint and morbid obesity.  Continue use of diabetic shoes and strongly encouraged her to resume PT to maintain proximal muscle strength and work on balance.  CKD stage 3 due to type 2 diabetes mellitus ([place])    She did not  Tolerate previous attempts at aggressive  diuresis due to decline in GFR.  Her GFR improved with suspension but has not normalized; however it has been steady  with increase in torsemide dose to 20 mg daily. Referral to nephrology recommended.   Lab Results  Component Value Date   CREATININE 1.27 (H) 01/06/2021          Relevant Orders   Ambulatory referral to Nephrology   Obesity hypoventilation syndrome ([place])    Secondary to body habitus and longstanding OSA.  Encouraged to stop all sedating medications , including lunesta and diazepam , and practice incentive spirometry        Type 2 diabetes mellitus with diabetic neuropathy, unspecified ([place])    Her readings have been quite elevated despite a reportedly small appetite.  She is not following a low glycemic index diet because her husband has been cooking since she has become so fatigued .  Regimen reviewed. She is taking [place], Lantus and Novolog.  I have asked to chec a pre and post meal CBG to guide therapy       Other Visit Diagnoses     Stage 3a chronic kidney disease ([place])    -  Primary   Relevant Orders   Basic metabolic panel (Completed)   Basic metabolic panel   Ambulatory referral to Nephrology       I have discontinued Tia Masker. Niu's butalbital-acetaminophen-caffeine, citalopram, Eszopiclone, buPROPion, and diazepam. I am also having her maintain her traMADol, nystatin, NONFORMULARY OR COMPOUNDED ITEM, diphenhydrAMINE, albuterol, aspirin EC, clopidogrel, hyoscyamine, loperamide, Anoro Ellipta, Droplet Pen Needles, NovoLOG FlexPen, potassium chloride SA, clotrimazole-betamethasone, ipratropium, dapagliflozin propanediol, nitroGLYCERIN, FreeStyle Libre 2 Reader, diphenoxylate-atropine, fexofenadine, Lantus SoloStar, metoprolol tartrate, atorvastatin, torsemide, and pregabalin.  No orders of the defined types were placed in this encounter.   Medications Discontinued During This Encounter  Medication Reason   butalbital-acetaminophen-caffeine  (FIORICET) 50-325-40 MG tablet    buPROPion (WELLBUTRIN) 75 MG tablet    citalopram (CELEXA) 10 MG tablet    diazepam (VALIUM) 5 MG tablet    Eszopiclone 3 MG TABS     A total of 40 minutes was spent with patient more than half of which was spent in counseling patient on her conditions including OHS and CKD , reviewing and explaining recent labs and imaging studies done, and coordination of care.   Follow-up: No follow-ups on file.   [name], MD

## 2021-01-06 NOTE — Patient Instructions (Addendum)
I agree with stopping citalopram, wellbutrin and lunesta.  Wear your CPAP whenever you take a nap.  Beware of soups that contain pasta and rice  Record EVERYTHING you eat for your evening meal and check a pre and post sugar several nights per week instead of fasting    Obesity-Hypoventilation Syndrome Obesity-hypoventilation syndrome (OHS) is a condition in which a person cannot efficiently move air in and out of the lungs (ventilate). This condition causes hypoventilation, which means the blood will have a buildup of carbon dioxideand a drop in oxygen levels. Key factors for OHS include having too much body fat, or obesity, and having high levels of awake daytime carbon dioxide (hypercapnia). OHS can increase the risk for: Cor pulmonale, or right-sided heart failure. Congestive heart failure. Pulmonary hypertension, or high blood pressure in the arteries in the lungs. Too many red blood cells in the body. Disability or death. What are the causes? This condition may be caused by: Being obese with a BMI (body mass index) greater than or equal to 30 kg/m2. Having too much fat around the abdomen, chest, and neck. The brain being unable to properly manage the high carbon dioxide and low oxygen levels. Hormones made by fat cells. These hormones may interfere with breathing. Sleep apnea. This is when breathing stops, pauses, or is shallow during sleep. What are the signs or symptoms? Symptoms of this condition include: Feeling sleepy during the day. Headaches. These may be worse in the morning. Shortness of breath. Snoring, choking, gasping, or trouble breathing during sleep. Poor concentration or poor memory. Mood changes or feeling irritable. Depression. How is this diagnosed? This condition may be diagnosed by: BMI measurement. Blood tests to measure blood levels of serum bicarbonate, carbon dioxide, and oxygen. Pulse oximetry to measure the amount of oxygen in your blood. This uses  a small device that is placed on your finger, earlobe, or toe. Polysomnogram, or sleep study, to check your breathing patterns and levels of oxygen and carbon dioxide while you sleep. You may also have other tests, including: A chest X-ray to rule out other breathing problems. Lung tests, or pulmonary function tests, to rule out other breathing problems. ECG (electrocardiogram) or echocardiogram to check for signs of heart failure. How is this treated? This condition may be treated with: A device such as a continuous positive airway pressure (CPAP) machine or a bi-level positive airway pressure (BPAP) machine. These devices deliver pressure and sometimes oxygen to make breathing easier. A mask may be placed over your nose or mouth. Oxygen if your blood oxygen levels are low. A weight-loss program. Bariatric, or weight-loss, surgery. Tracheostomy. A tube is placed in the windpipe through the neck to help with breathing. Follow these instructions at home:  Medicines Take over-the-counter and prescription medicines only as told by your health care provider. Ask your health care provider what medicines are safe for you. You may be told to avoid medicines such as sedatives and narcotics. These can affect breathing and make OHS worse. Sleeping habits If you are prescribed a CPAP or a BPAP machine, make sure you understand how to use it. Use your CPAP or BPAP machine only as told by your health care provider. Try to get at least 8 hours of sleep every night. Eating and drinking  Eat foods that are high in fiber, such as beans, whole grains, and fresh fruits and vegetables. Limit foods that are high in fat and processed sugars, such as fried or sweet foods. Drink enough fluid  to keep your urine pale yellow. Do not drink alcohol if: Your health care provider tells you not to drink. You are pregnant, may be pregnant, or are planning to become pregnant.  General instructions Follow a diet and  exercise plan that helps you reach and keep a healthy weight as told by your health care provider. Exercise regularly as told by your health care provider. Do not use any products that contain nicotine or tobacco, such as cigarettes, e-cigarettes, and chewing tobacco. If you need help quitting, ask your health care provider. Keep all follow-up visits as told by your health care provider. This is important. Contact a health care provider if: You develop new or worsening shortness of breath. You are having trouble waking up or staying awake. You are confused. You have chest pain. You have fast or irregular heartbeats. You are dizzy or you faint. You develop a cough. You have a fever. Get help right away if: You have any symptoms of a stroke. "BE FAST" is an easy way to remember the main warning signs of a stroke: B - Balance. Signs are dizziness, sudden trouble walking, or loss of balance. E - Eyes. Signs are trouble seeing or a sudden change in vision. F - Face. Signs are sudden weakness or numbness of the face, or the face or eyelid drooping on one side. A - Arms. Signs are weakness or numbness in an arm. This happens suddenly and usually on one side of the body. S - Speech. Signs are sudden trouble speaking, slurred speech, or trouble understanding what people say. T - Time. Time to call emergency services. Write down what time symptoms started. You have other signs of a stroke, such as: A sudden, severe headache with no known cause. Nausea or vomiting. Seizure. These symptoms may represent a serious problem that is an emergency. Do not wait to see if the symptoms will go away. Get medical help right away. Call your local emergency services (911 in the [place] Do not drive yourself to the hospital. If you ever feel like you may hurt yourself or others, or have thoughts about taking your own life, get help right away. Go to your nearest emergency department or: Call your local emergency  services (911 in the [place] Call a suicide crisis helpline, such as the [facility] at 989-830-3058. This is open 24 hours a day in the [place] Text the Crisis Text Line at 561-707-2612 (in the [place].). Summary Obesity-hypoventilation syndrome (OHS) causes hypoventilation, which means the blood will have a buildup of carbon dioxideand a drop in oxygen levels. Key factors for OHS include having too much body fat, or obesity, and having high levels of awake daytime carbon dioxide (hypercapnia). OHS can increase the risk for heart failure, pulmonary hypertension, disability, and death. Follow your diet and exercise plan as told by your health care provider. This information is not intended to replace advice given to you by your health care provider. Make sure you discuss any questions you have with your healthcare provider. Document Revised: 06/26/2019 Document Reviewed: 06/26/2019 Elsevier Patient Education  2022 [employer].

## 2021-01-07 ENCOUNTER — Encounter: Payer: Self-pay | Admitting: Internal Medicine

## 2021-01-09 ENCOUNTER — Ambulatory Visit: Payer: Medicare Other | Admitting: *Deleted

## 2021-01-09 ENCOUNTER — Other Ambulatory Visit: Payer: Self-pay

## 2021-01-09 DIAGNOSIS — I5032 Chronic diastolic (congestive) heart failure: Secondary | ICD-10-CM

## 2021-01-09 DIAGNOSIS — E1140 Type 2 diabetes mellitus with diabetic neuropathy, unspecified: Secondary | ICD-10-CM

## 2021-01-09 MED ORDER — CYANOCOBALAMIN 1000 MCG/ML IJ SOLN: 1000.0000 ug | INTRAMUSCULAR | 1 refills | Status: DC

## 2021-01-09 NOTE — Assessment & Plan Note (Signed)
She has a waddling gait due to neuropathy,  Charcot joint and morbid obesity.  Continue use of diabetic shoes and strongly encouraged her to resume PT to maintain proximal muscle strength and work on balance.

## 2021-01-09 NOTE — Assessment & Plan Note (Addendum)
Her readings have been quite elevated despite a reportedly small appetite.  She is not following a low glycemic index diet because her husband has been cooking since she has become so fatigued .  Regimen reviewed. She is taking [place], Lantus and Novolog.  I have asked to chec a pre and post meal CBG to guide therapy

## 2021-01-09 NOTE — Assessment & Plan Note (Signed)
Secondary to body habitus and longstanding OSA.  Encouraged to stop all sedating medications , including lunesta and diazepam , and practice incentive spirometry

## 2021-01-09 NOTE — Assessment & Plan Note (Addendum)
She did not  Tolerate previous attempts at aggressive diuresis due to decline in GFR.  Her GFR improved with suspension but has not normalized; however it has been steady  with increase in torsemide dose to 20 mg daily. Referral to nephrology recommended.   Lab Results  Component Value Date   CREATININE 1.27 (H) 01/06/2021

## 2021-01-09 NOTE — Chronic Care Management (AMB) (Signed)
Chronic Care Management   CCM RN Visit Note  01/09/2021 Name: Denise Sharp MRN: [id number] DOB: [birth year]  Subjective: Denise Sharp is a 75 y.o. year old female who is a primary care patient of [name], Aris Everts, MD. The care management team was consulted for assistance with disease management and care coordination needs.    Engaged with patient by telephone for follow up visit in response to provider referral for case management and/or care coordination services.   Consent to Services:  The patient was given information about Chronic Care Management services, agreed to services, and gave verbal consent prior to initiation of services.  Please see initial visit note for detailed documentation.   Patient agreed to services and verbal consent obtained.   Assessment: Review of patient past medical history, allergies, medications, health status, including review of consultants reports, laboratory and other test data, was performed as part of comprehensive evaluation and provision of chronic care management services.   SDOH (Social Determinants of Health) assessments and interventions performed:    CCM Care Plan  Allergies  Allergen Reactions   Demeclocycline Hives   Erythromycin Nausea And Vomiting and Other (See Comments)    Severe irritable bowel   Flagyl [Metronidazole] Nausea And Vomiting and Other (See Comments)    Severe irritable bowel   Glucophage [Metformin Hcl] Nausea And Vomiting and Other (See Comments)    "Sick" "I won't take anything that has metformin in it"   Tetracyclines & Related Hives and Rash   Diovan [Valsartan] Nausea Only        Sulfa Antibiotics Rash and Other (See Comments)    As child   Xanax [Alprazolam] Other (See Comments)    Unknown reaction    Outpatient Encounter Medications as of 01/09/2021  Medication Sig   dapagliflozin propanediol (FARXIGA) 10 MG TABS tablet Take 1 tablet (10 mg total) by mouth daily before breakfast.   insulin  aspart (NOVOLOG FLEXPEN) 100 UNIT/ML FlexPen Inject 8 units up to 3 times daily before meals   insulin glargine (LANTUS SOLOSTAR) 100 UNIT/ML Solostar Pen Inject 15 Units into the skin daily.   albuterol (VENTOLIN HFA) 108 (90 Base) MCG/ACT inhaler Inhale 2 puffs into the lungs every 8 (eight) hours as needed for wheezing or shortness of breath.   aspirin EC 81 MG tablet Take 1 tablet (81 mg total) by mouth daily.   atorvastatin (LIPITOR) 40 MG tablet Take 1 tablet (40 mg total) by mouth daily.   clopidogrel (PLAVIX) 75 MG tablet Take 1 tablet (75 mg total) by mouth daily.   clotrimazole-betamethasone (LOTRISONE) cream Apply 1 application topically 2 (two) times daily as needed.   Continuous Blood Gluc Receiver (FREESTYLE LIBRE 2 READER) DEVI Use to check sugar at least TID   diphenhydrAMINE (BENADRYL) 25 MG tablet Take 50 mg by mouth daily as needed for allergies.   diphenoxylate-atropine (LOMOTIL) 2.5-0.025 MG tablet Take 1 tablet by mouth 4 (four) times daily as needed for diarrhea or loose stools.   fexofenadine (ALLEGRA) 180 MG tablet Take 180 mg by mouth daily.   hyoscyamine (ANASPAZ) 0.125 MG TBDP disintergrating tablet DISSOLVE UNDER TONGUE 15 MINUTES BEFORE MEALS   Insulin Pen Needle (DROPLET PEN NEEDLES) 31G X 5 MM MISC USE ONE NEEDLE SUBCUTANEOUSLY AS DIRECTED (REMOVE AND DISCARD NEEDLE IN SHARPS CONTAINER IMMEDIATELY AFTER USE)   ipratropium (ATROVENT) 0.06 % nasal spray Place 2 sprays into both nostrils 4 (four) times daily as needed for rhinitis.   loperamide (IMODIUM) 2 MG capsule Take  by mouth as needed.   metoprolol tartrate (LOPRESSOR) 25 MG tablet Take 0.5 tablets (12.5 mg total) by mouth 2 (two) times daily.   nitroGLYCERIN (NITROSTAT) 0.4 MG SL tablet Place 1 tablet (0.4 mg total) under the tongue every 5 (five) minutes as needed for chest pain.   NONFORMULARY OR COMPOUNDED [place]  Combination Pain Cream -  Baclofen 2%, Doxepin 5%, Gabapentin 6%, Topiramate 2%,  Pentoxifylline 3% Apply 1-2 grams to affected area 3-4 times daily Qty. 120 gm 3 refills   nystatin (MYCOSTATIN/NYSTOP) powder Apply 1 g topically 4 (four) times daily as needed (rash).   potassium chloride SA (KLOR-CON) 20 MEQ tablet Take 1 tablet (20 mEq total) by mouth daily.   pregabalin (LYRICA) 50 MG capsule TAKE 1 CAPSULE BY MOUTH 2 TIMES DAILY.   torsemide (DEMADEX) 10 MG tablet Take 1-2 tablets (10-20 mg total) by mouth daily as needed (for weight gain greater than 2 lbs in 1 day or 5 lbs in 1 week or worsening leg swelling).   traMADol (ULTRAM) 50 MG tablet Take 0.5 tablets (25 mg total) by mouth every 12 (twelve) hours as needed for severe pain.   umeclidinium-vilanterol (ANORO ELLIPTA) 62.5-25 MCG/INH AEPB Inhale 1 puff into the lungs daily.   [DISCONTINUED] cyanocobalamin (,VITAMIN B-12,) 1000 MCG/ML injection Inject 1 mL (1,000 mcg total) into the muscle every 30 (thirty) days. Inject 1 ml (1000 mcg ) IM weekly x 4,  Then monthly thereafter   No facility-administered encounter medications on file as of 01/09/2021.    Patient Active Problem List   Diagnosis Date Noted   PSVT (paroxysmal supraventricular tachycardia) ([place]) 12/15/2020   Acute renal failure (ARF) ([place]) 12/09/2020   Temporal arteritis syndrome ([place]) 11/17/2020   Other pulmonary embolism with acute cor pulmonale, unspecified chronicity ([place]) 11/17/2020   Mild episode of recurrent major depressive disorder ([place]) 11/17/2020   Hyperlipidemia associated with type 2 diabetes mellitus ([place]) 08/21/2020   Iron deficiency 06/28/2020   Skin lesion of chest wall 06/10/2020   Elevated alkaline phosphatase level 06/10/2020   Coagulation defect ([place]) 06/06/2020   Chronic heart failure with preserved ejection fraction (HFpEF) ([place]) 01/14/2020   Hyperlipidemia LDL goal <70 01/14/2020   Coronary artery disease involving native coronary artery of native heart without angina pectoris 01/13/2020   S/P cardiac catheterization  01/13/2020   Accelerating angina ([place]) 12/31/2019   Dysphagia 11/30/2019   Bladder pain 08/23/2019   Gastritis 08/07/2019   Lab test negative for COVID-19 virus 08/07/2019   S/P panniculectomy 06/16/2019   Major depressive disorder with current active episode 05/30/2019   Exposure to COVID-19 virus 12/25/2018   Functional diarrhea 10/22/2018   Bilateral cataracts 06/26/2018   Status post bariatric surgery 02/05/2018   GAD (generalized anxiety disorder) 02/01/2018   Rectocele 01/24/2018   Vaginal prolapse 01/04/2018   Hepatic steatosis 01/04/2018   Microalbuminuria due to type 2 diabetes mellitus ([place]) 11/21/2017   Hospital discharge follow-up 09/22/2017   Hypocalcemia 09/22/2017   Hypotension 09/22/2017   Fecal incontinence 09/22/2017   B12 deficiency 09/22/2017   Transient disorientation 08/20/2017   Rotator cuff arthropathy, right 08/13/2017   Primary localized osteoarthrosis of shoulder 08/13/2017   Encounter for preoperative examination for general surgical procedure 08/03/2017   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 ([place]) 08/03/2017   Candidiasis, intertrigo 08/03/2017   Venous stasis dermatitis of right lower extremity 11/21/2016   Edema 12/13/2015   Hypersomnia, persistent 06/18/2015   Mechanical breakdown of implanted electronic neurostimulator of peripheral  nerve ([place]) 10/16/2014   Obesity hypoventilation syndrome ([place]) 08/13/2014   Frequent falls 06/23/2014   Chronic cough 05/05/2014   Adenoma of left adrenal gland 03/24/2014   New onset of headaches after age 67 03/23/2014   Vitamin D deficiency 01/06/2014   Weight gain following gastric bypass surgery 12/03/2013   Morbid obesity ([place]) 08/25/2013   Diaphragmatic hernia 04/06/2013   Diabetic polyneuropathy ([place]) 03/25/2013   Abnormality of gait 03/25/2013   Multiple pulmonary nodules 03/20/2013   DOE (dyspnea on exertion) 03/20/2013   Iron deficiency anemia 08/06/2012   Functional disorder of  bladder 07/15/2012   Incomplete bladder emptying 07/15/2012   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[place]) 07/15/2012   Urge incontinence 07/15/2012   Increased frequency of urination 07/15/2012   OSA on CPAP 03/02/2012   Insomnia secondary to anxiety 02/29/2012   Sciatica 09/05/2011   Cervical stenosis of spinal canal 06/14/2011   Restless legs syndrome 03/13/2011   Essential hypertension 03/12/2011   Type 2 diabetes mellitus with diabetic neuropathy, unspecified ([place]) 03/12/2011   Hearing loss, right 03/12/2011    Conditions to be addressed/monitored:CHF and DMII  Care Plan : Heart Failure (Adult)  Updates made by Leona Singleton, RN since 01/09/2021 12:00 AM     Problem: Symptom Exacerbation (Heart Failure)   Priority: Medium     Long-Range Goal: Symptom Exacerbation Prevented or Minimized   Start Date: 10/19/2020  Expected End Date: 07/14/2021  This Visit's Progress: On track  Recent Progress: Not on track  Priority: Medium  Note:   Current Barriers:  Knowledge deficit related to basic heart failure pathophysiology and self care management patient reporting she continues to have shortness of breath, as normal and some chest pains.  Continues to weigh daily.  Weight is decreased to 248.8 (baseline is near 244) States she has been communicating with Cardiology for dosing of fluid medication, Demadex (currently taking 20 mg daily until near her baseline weight).  Continues to adhere to 2 liters per day fluid restriction.  Excited about upcoming Heart Failure Clinic consultation. Case Manager Clinical Goal(s):  patient will weigh self daily and record patient will verbalize understanding of Heart Failure Action Plan and when to call doctor patient will take all Heart Failure mediations as prescribed patient will weigh daily and record (notifying MD of 3 lb weight gain over night or 5 lb in a week) Interventions:  Collaboration with [name], MD regarding development and update of  comprehensive plan of care as evidenced by provider attestation and co-signature Inter-disciplinary care team collaboration (see longitudinal plan of care) Basic overview and discussion of pathophysiology of Heart Failure reviewed  Provided verbal education on low sodium diet Provided education about placing scale on hard, flat surface and advised patient to weigh each morning after emptying bladder Discussed importance of daily weight and advised patient to weigh and record daily Reviewed role of diuretics in prevention of fluid overload and management of heart failure; encouraged to monitor for increased weight or decreased output Barriers to lifestyle changes reviewed and addressed and barriers to treatment reviewed and addressed Medication-adherence assessment completed and encouraged Rescue (action) plan reviewed Self-awareness of signs/symptoms of worsening disease encouraged Fall precautions and preventions reviewed and discussed Encouraged to increase activity or exercise based on tolerance and encouraged to pace activity allowing for rest Reviewed foods high in sodium, discussed eating fresh fruits and vegetables and avoiding canned and processed foods, encouraged to continue to read food labels Reviewed upcoming medical appointments and encouraged patient to attend HF  Clinic appointment on 01/11/21 Provided patient with emotional support and empathy Discussed deep breathing and relaxation techniques and encouraged patient to practice Patient Goals/Self-Care Activities: Call office if I gain more than 2 pounds in one day or 5 pounds in one week Watch for swelling in feet, ankles and legs every day Weigh myself daily, first thing in morning after emptying bladder Record weighs in log for provider review Eat fresh or frozen vegetables, avoid canned and processed foods Attend Heart Failure Clinic appointment on 01/11/21 Follow Up Plan: The care management team will reach out to the  patient again over the next 20 business days.       Care Plan : Diabetes Type 2 (Adult)  Updates made by Leona Singleton, RN since 01/09/2021 12:00 AM     Problem: Glycemic Management (Diabetes, Type 2)   Priority: High     Long-Range Goal: Patient will report no sustained fasting blood sugars greater than 200 within the next 60 days   Start Date: 10/19/2020  Expected End Date: 07/14/2021  This Visit's Progress: Not on track  Recent Progress: Not on track  Priority: High  Note:   Objective:  Lab Results  Component Value Date   HGBA1C 8.2 (A) 11/15/2020  Current Barriers:  Knowledge Deficits related to basic Diabetes pathophysiology and self care/management as evidenced by patient 's continued elevated blood sugars.  Fasting blood sugar this morning was 207 with recent ranges of 200-300's.  States she is unsure why blood sugars continue to be elevated, she does not feel like it is something she is eating.  Denies any hypoglycemic episodes or any recent change in diet.  Encouraged patient to contact CCM Pharmacist related to insulin adjustments.  Denies any falls since Saturday on 01/03/21, sliding off a stool.  Patient is reporting medications have been adjusted by PCP last week after long discussion. Case Manager Clinical Goal(s):  patient will demonstrate improved adherence to prescribed treatment plan for diabetes self care/management as evidenced by: 3 times a day monitoring and recording of CBG,  adherence to ADA/ carb modified diet, adherence to prescribed medication regimen, contacting provider for new or worsened symptoms or questions Interventions:  Collaboration with [name], MD regarding development and update of comprehensive plan of care as evidenced by provider attestation and co-signature Inter-disciplinary care team collaboration (see longitudinal plan of care) Provided education to patient about basic DM disease process Reviewed medications with patient and  discussed importance of medication adherence Discussed plans with patient for ongoing care management follow up and provided patient with direct contact information for care management team Provided patient with educational materials related to hypo and hyperglycemia and importance of correct treatment Advised patient, providing education and rationale, to check cbg 3 times a day and record, calling CCM Pharmacist or PCP for findings outside established parameters.   Barriers to adherence to treatment plan identified Blood glucose readings reviewed and discussed ways to help lower ranges Resources required to improve adherence to care identified Self-awareness of signs/symptoms of hypo or hyperglycemia encouraged Use of blood glucose monitoring log promoted Discussed and reviewed healthier low carbohydrate food and drink options Confirmed patient has not had yearly eye exam and encouraged patient to schedule as soon as possible Encouraged to continue to work with CCM Pharmacist; encouraged patient to contact CCM Pharmacist for sustained elevations; provided patient with CCM Pharmacist contact number; help arrange in person follow up appointment Encouraged to attend all scheduled provider appointments  Discussed and encouraged to adhere  to prescribed ADA/carb modified, low sodium fluid restriction, discussed and offered Diabetic Nutrition Class referral to discuss blending diabetic and heart failure diet (patient declines at this time) Assessed barriers to management plan, such as food insecurity, age, developmental ability, depression, anxiety, fear of hypoglycemia or weight gain, as well as medication cost, side effects and complicated regimen.  Discussed continuous blood sugar monitoring system and need to scan multiple times a day to check blood sugars Fall precautions and preventions reviewed and discussed; encouraged to use walker with all ambulation; Discuss home health therapy/nursing related  to increase in falls; patient declines referral at this time Encouraged patient to really consider retirement community/assisted living facility for safety Instructed and encouraged patient to not make any medication changes/adjustments without discussing with PCP first Discussed importance of sleep and rest with patient in overall health goals Patient Goals/Self-Care Activities: Check blood sugar at least 3 times a day Check blood sugar if I feel it is too high or too low Enter blood sugar readings and medication/insulin into daily log for provider review Schedule/attend yearly diabetic eye exam Contact CCM Pharmacist or PCP if blood sugars sustained over 250 consistently Try to avoid drinking sweet tea with regular sugar Use walker or wheelchair for all ambulation Increase activity as tolerated; Fall precautions and preventions Please consider Home Health Physical Therapy Follow Up Plan: The care management team will reach out to the patient again over the next 20 business days.       Plan:The care management team will reach out to the patient again over the next 20 business days.  Hubert Azure RN, MSN RN Care Management Coordinator [place] [phone] [email or web address]

## 2021-01-09 NOTE — Patient Instructions (Signed)
Visit Information  PATIENT GOALS:  Goals Addressed             This Visit's Progress    (RNCM) Monitor and Manage My Blood Sugar-Diabetes Type 2   Not on track    Timeframe:  Long-Range Goal Priority:  High Start Date:     10/19/20                      Expected End Date:   06/14/21                    Follow Up Date 01/25/21    Check blood sugar at least 3 times a day Check blood sugar if I feel it is too high or too low Enter blood sugar readings and medication/insulin into daily log for provider review Schedule/attend yearly diabetic eye exam Contact CCM Pharmacist or PCP if blood sugars sustained over 250 consistently Try to avoid drinking sweet tea with regular sugar Use walker or wheelchair for all ambulation Increase activity as tolerated; Fall precautions and preventions Please consider Home Health Physical Therapy  Why is this important?   Checking your blood sugar at home helps to keep it from getting very high or very low.  Writing the results in a diary or log helps the doctor know how to care for you.  Your blood sugar log should have the time, date and the results.  Also, write down the amount of insulin or other medicine that you take.  Other information, like what you ate, exercise done and how you were feeling, will also be helpful.     Notes:       (RNCM) Track and Manage Fluids and Swelling-Heart Failure   On track    Timeframe:  Long-Range Goal Priority:  Medium Start Date: 10/19/20                            Expected End Date:    06/14/21                   Follow Up Date 01/25/21    Call office if I gain more than 2 pounds in one day or 5 pounds in one week Watch for swelling in feet, ankles and legs every day Weigh myself daily, first thing in morning after emptying bladder Record weighs in log for provider review Eat fresh or frozen vegetables, avoid canned and processed foods Attend Heart Failure Clinic appointment on 01/11/21   Why is this  important?   It is important to check your weight daily and watch how much salt and liquids you have.  It will help you to manage your heart failure.    Notes:          Patient verbalizes understanding of instructions provided today and agrees to view in [place].   The care management team will reach out to the patient again over the next 20 business days.   Hubert Azure RN, MSN RN Care Management Coordinator [facility] [phone] [email or web address]

## 2021-01-11 ENCOUNTER — Ambulatory Visit (HOSPITAL_COMMUNITY)
Admission: RE | Admit: 2021-01-11 | Discharge: 2021-01-11 | Disposition: A | Discharge status: Home / Self Care | Payer: Medicare Other | Source: Ambulatory Visit | Attending: Internal Medicine | Admitting: Internal Medicine

## 2021-01-11 ENCOUNTER — Other Ambulatory Visit: Payer: Self-pay

## 2021-01-11 ENCOUNTER — Encounter (HOSPITAL_COMMUNITY): Payer: Self-pay | Admitting: Internal Medicine

## 2021-01-11 VITALS — BP 122/70 | HR 79 | Wt 249.4 lb

## 2021-01-11 DIAGNOSIS — I471 Supraventricular tachycardia: Secondary | ICD-10-CM | POA: Insufficient documentation

## 2021-01-11 DIAGNOSIS — G4733 Obstructive sleep apnea (adult) (pediatric): Secondary | ICD-10-CM | POA: Insufficient documentation

## 2021-01-11 DIAGNOSIS — Z79899 Other long term (current) drug therapy: Secondary | ICD-10-CM | POA: Insufficient documentation

## 2021-01-11 DIAGNOSIS — G2581 Restless legs syndrome: Secondary | ICD-10-CM | POA: Insufficient documentation

## 2021-01-11 DIAGNOSIS — N1831 Chronic kidney disease, stage 3a: Secondary | ICD-10-CM | POA: Diagnosis not present

## 2021-01-11 DIAGNOSIS — Z6841 Body Mass Index (BMI) 40.0 and over, adult: Secondary | ICD-10-CM | POA: Insufficient documentation

## 2021-01-11 DIAGNOSIS — Z7982 Long term (current) use of aspirin: Secondary | ICD-10-CM | POA: Diagnosis not present

## 2021-01-11 DIAGNOSIS — Z955 Presence of coronary angioplasty implant and graft: Secondary | ICD-10-CM | POA: Insufficient documentation

## 2021-01-11 DIAGNOSIS — I10 Essential (primary) hypertension: Secondary | ICD-10-CM | POA: Diagnosis not present

## 2021-01-11 DIAGNOSIS — Z7902 Long term (current) use of antithrombotics/antiplatelets: Secondary | ICD-10-CM | POA: Diagnosis not present

## 2021-01-11 DIAGNOSIS — I5032 Chronic diastolic (congestive) heart failure: Secondary | ICD-10-CM | POA: Insufficient documentation

## 2021-01-11 DIAGNOSIS — Z8249 Family history of ischemic heart disease and other diseases of the circulatory system: Secondary | ICD-10-CM | POA: Diagnosis not present

## 2021-01-11 DIAGNOSIS — Z794 Long term (current) use of insulin: Secondary | ICD-10-CM | POA: Insufficient documentation

## 2021-01-11 DIAGNOSIS — I25110 Atherosclerotic heart disease of native coronary artery with unstable angina pectoris: Secondary | ICD-10-CM | POA: Insufficient documentation

## 2021-01-11 DIAGNOSIS — K589 Irritable bowel syndrome without diarrhea: Secondary | ICD-10-CM | POA: Insufficient documentation

## 2021-01-11 DIAGNOSIS — I130 Hypertensive heart and chronic kidney disease with heart failure and stage 1 through stage 4 chronic kidney disease, or unspecified chronic kidney disease: Secondary | ICD-10-CM | POA: Diagnosis not present

## 2021-01-11 DIAGNOSIS — Z9884 Bariatric surgery status: Secondary | ICD-10-CM | POA: Diagnosis not present

## 2021-01-11 DIAGNOSIS — I2721 Secondary pulmonary arterial hypertension: Secondary | ICD-10-CM

## 2021-01-11 DIAGNOSIS — E1122 Type 2 diabetes mellitus with diabetic chronic kidney disease: Secondary | ICD-10-CM | POA: Diagnosis not present

## 2021-01-11 DIAGNOSIS — R42 Dizziness and giddiness: Secondary | ICD-10-CM | POA: Diagnosis not present

## 2021-01-11 DIAGNOSIS — R82998 Other abnormal findings in urine: Secondary | ICD-10-CM | POA: Diagnosis not present

## 2021-01-11 NOTE — Patient Instructions (Signed)
Discuss Tirzepatide with Dr [name]  Please call our office in November to schedule your follow up appointment  If you have any questions or concerns before your next appointment please send [place] a message through [facility] or call our office at [phone].    TO LEAVE A MESSAGE FOR THE NURSE SELECT OPTION 2, PLEASE LEAVE A MESSAGE INCLUDING: YOUR NAME DATE OF BIRTH CALL BACK NUMBER REASON FOR CALL**this is important as we prioritize the call backs  YOU WILL RECEIVE A CALL BACK THE SAME DAY AS LONG AS YOU CALL BEFORE 4:00 PM  At the [facility], you and your health needs are our priority. As part of our continuing mission to provide you with exceptional heart care, we have created designated Provider Care Teams. These Care Teams include your primary Cardiologist (physician) and Advanced Practice Providers (APPs- Physician Assistants and Nurse Practitioners) who all work together to provide you with the care you need, when you need it.   You may see any of the following providers on your designated Care Team at your next follow up: Dr Glori Bickers Dr Loralie Champagne Dr Patrice Paradise, NP Lyda Jester, [place] Ginnie Smart Audry Riles, PharmD   Please be sure to bring in all your medications bottles to every appointment.

## 2021-01-11 NOTE — Progress Notes (Signed)
ADVANCED HF CLINIC CONSULT NOTE  Referring Physician: End, Coleridge Primary Care: Deborra Medina Primary Cardiologist: [name]  HPI:  Ms. Boza is a 75 y.o. female with history of CAD s/p DES x2 to mid/ostial LAD 12/2019 , Tobacco Use Disorder, hypertension, type 2 diabetes mellitus, iron deficiency anemia following gastric bypass x2, irritable bowel syndrome, esophageal stenosis, chronic dizziness with gait instability, cervicogenic headache with cervical spine stenosis, obstructive sleep apnea on CPAP, restless leg syndrome, and adrenal mass.   2013 had an echo for dizziness and dyspnea that returned with EF 07-62%, normal diastolic function.    She had difficult to control hypertension and was on multiple agents, in 2014 she underwent renal artery angiography which was normal and coronary angiography which was also normal with minimal luminal irregularities.    Normal PFT's 04/2021  12/2019 she had worsening chest pain for two weeks which progressed to unstable angina.  She was taken for [facility] which demonstrated 65% ostial LAD disease, 80% mid LAD disease.  LVEDP only mildly elevated, LV gram with normal EF.  She underwent PCI with DES x2 to mid/ostial LAD.  ECHO EF 65-70%, G2DD, normal RV, valves okay  Saw Dr. Saunders Revel 08/2020 despite intervention still felt very poorly with class III symptoms and starting to retain more fluid.  He started her on lasix.  Given little improvement he took her for [facility] which demonstrated Non-obstructive coronary artery disease with 40% ostial RCA stenosis. Widely patent LAD stents.  RHC:  RA (mean): 17 mmHg RV (S/EDP): 40/17 mmHg PA (S/D, mean): 40/20 (27) mmHg PCWP (mean): 22 mmHg  Ao sat: 96% PA sat: 68%  Fick CO: 3.9 L/min Fick CI: 1.8 L/min/m^2  He continued diuresis, a referral was placed to AHF clinic. Diuretics adjusted visit to visit switched to torsemide.  At one point metolazone added. Seen by Ignacia Bayley 10/2020 for palpitations  had a zio monitor placed for 4 days which demonstrated 11 NSVT longest run 11 beats and 14 SVT episodes longest 18 beats. She was started on metoprolol.  She developed an AKI thought to be secondary to overdiuresis and PCP tapered back torsemide, renal function improved but weight gain ensued and it was started back at a lower dose.  She was referred to nephrology.     Here for new consultation.  Says she feels sleepy and easily fatigued since the [facility] in 2021.  Thinks she's on too much medicine recently asked her PCP to take her off all medications except heart/diabetes meds. Not sleeping well since no longer taking diazepam, buproprion, eszopiclone, citalopram, fioricet.  She gets fatigued very easily.  Couldn't stand up long enough to put saran wrap on her dinner last night due to dizziness, fatigue.  Blood pressures at home are much lower she says she has bp in the 80's / 40's but the log she brought today had normotensive values.  Taking Torsemide 20mg  daily, doesn't urinate with the 10mg .  Weight trends between 245-250 usually.      Review of Systems: [y] = yes, [ ]  = no   General: Weight gain [ ] ; Weight loss [ ] ; Anorexia [ ] ; Fatigue [ y]; Fever [ ] ; Chills [ ] ; Weakness Blue.Reese ]  Cardiac: Chest pain/pressure [ ] ; Resting SOB Blue.Reese ]; Exertional SOB Blue.Reese ]; Orthopnea [ ] ; Pedal Edema Blue.Reese ]; Palpitations [ ] ; Syncope [ ] ; Presyncope [ ] ; Paroxysmal nocturnal dyspnea[ ]   Pulmonary: Cough [ ] ; Wheezing[ ] ; Hemoptysis[ ] ; Sputum [ ] ; Snoring [ ]   GI: Vomiting[ ] ; Dysphagia[ ] ; Melena[ ] ; Hematochezia [ ] ; Heartburn[ ] ; Abdominal pain [ ] ; Constipation [ ] ; Diarrhea [ ] ; BRBPR [ ]   GU: Hematuria[ ] ; Dysuria [ ] ; Nocturia[ ]   Vascular: Pain in legs with walking [ ] ; Pain in feet with lying flat [ ] ; Non-healing sores [ ] ; Stroke [ ] ; TIA [ ] ; Slurred speech [ ] ;  Neuro: Headaches[ ] ; Vertigo[ ] ; Seizures[ ] ; Paresthesias[ ] ;Blurred vision [ ] ; Diplopia [ ] ; Vision changes [ ]   Ortho/Skin: Arthritis [ y];  Joint pain [ y]; Muscle pain [ ] ; Joint swelling [ ] ; Back Pain [ ] ; Rash [ ]   Psych: Depression[ y]; Anxiety[]   Heme: Bleeding problems [ ] ; Clotting disorders [ ] ; Anemia [ ]   Endocrine: Diabetes Blue.Reese ]; Thyroid dysfunction[ ]    Past Medical History:  Diagnosis Date   (HFpEF) heart failure with preserved ejection fraction ([place])    a. 12/2019 Echo: EF 65-70%, Gr2 DD. No significant valvular dzs.   Abnormality of gait 03/25/2013   Adrenal mass, left ([place])    Anemia    iron deficient post  2 unit txfsn 2009, normal endo/colonoscopy by Wohl   Arthritis    CAD (coronary artery disease) with h/o Atypical Chest Pain    a. 04/1986 Cath ([facility]): nl cors, EF 65%; b. 07/2012 Cath [facility]): Diff minor irregs; c. 07/2016 MV [facility]): "Equivocal"; d. 08/2016 Cardiac CT Ca2+ score [facility]): Ca2+ 9702; e. 05/2019 MV: No ischemia. EF 75%; f. 12/2019 PCI: LM nl, LAD 65ost (3.5x12 Resolute DES), 53m (2.75x12 Resolute DES),LCX/RCA nl; g. 08/2020 Cath: patent LAD stents, RCA 40ost, elev RH pressures, EF >65%.   Cervical spinal stenosis 1994   due to trauma to back ([employer] accident), has intermittent paralysis and parasthesias   Cervicogenic headache 03/23/2014   Depression    Dizziness    chronic dizziness   DJD (degenerative joint disease)    a. Chronic R shoulder pain pending R shoulder replacement 07/2017.   Esophageal stenosis March 2011   with transietn outlet obstruction by food, cleared by EGD    Family history of adverse reaction to anesthesia    daughter PONV   Gastric bypass status for obesity    Headache(784.0)    Hypertension    IBS (irritable bowel syndrome)    Left bundle branch block (LBBB)    a. Intermittently present - likely rate related. - patient denies   Obesity    Obstructive sleep apnea    using CPAP   Polyneuropathy in diabetes(357.2) 03/25/2013   Restless leg syndrome    Rotator cuff arthropathy, right 08/13/2017   Syncope and collapse 03/12/2014   Type II diabetes mellitus (HCC)      Current Outpatient Medications  Medication Sig Dispense Refill   albuterol (VENTOLIN HFA) 108 (90 Base) MCG/ACT inhaler Inhale 2 puffs into the lungs every 8 (eight) hours as needed for wheezing or shortness of breath. 8 g 2   aspirin EC 81 MG tablet Take 1 tablet (81 mg total) by mouth daily. 30 tablet 2   atorvastatin (LIPITOR) 40 MG tablet Take 1 tablet (40 mg total) by mouth daily. 90 tablet 0   clopidogrel (PLAVIX) 75 MG tablet Take 1 tablet (75 mg total) by mouth daily. 90 tablet 0   clotrimazole-betamethasone (LOTRISONE) cream Apply 1 application topically 2 (two) times daily as needed.     Continuous Blood Gluc Receiver (FREESTYLE LIBRE 2 READER) DEVI Use to check sugar at least TID 1 each 1   cyanocobalamin (,  VITAMIN B-12,) 1000 MCG/ML injection Inject 1 mL (1,000 mcg total) into the muscle every 30 (thirty) days. Inject 1 ml (1000 mcg ) IM weekly x 4,  Then monthly thereafter 3 mL 1   Dapagliflozin Propanediol (FARXIGA PO) Take 20 mg by mouth daily.     diphenhydrAMINE (BENADRYL) 25 MG tablet Take 50 mg by mouth daily as needed for allergies.     diphenoxylate-atropine (LOMOTIL) 2.5-0.025 MG tablet Take 1 tablet by mouth 4 (four) times daily as needed for diarrhea or loose stools. 60 tablet 5   fexofenadine (ALLEGRA) 180 MG tablet Take 180 mg by mouth daily.     hyoscyamine (ANASPAZ) 0.125 MG TBDP disintergrating tablet DISSOLVE UNDER TONGUE 15 MINUTES BEFORE MEALS 360 tablet 1   insulin aspart (NOVOLOG FLEXPEN) 100 UNIT/ML FlexPen Inject 8 units up to 3 times daily before meals 15 mL 11   insulin glargine (LANTUS SOLOSTAR) 100 UNIT/ML Solostar Pen Inject 15 Units into the skin daily. 15 mL 3   Insulin Pen Needle (DROPLET PEN NEEDLES) 31G X 5 MM MISC USE ONE NEEDLE SUBCUTANEOUSLY AS DIRECTED (REMOVE AND DISCARD NEEDLE IN SHARPS CONTAINER IMMEDIATELY AFTER USE) 100 each 1   ipratropium (ATROVENT) 0.06 % nasal spray Place 2 sprays into both nostrils 4 (four) times daily as needed for  rhinitis.     loperamide (IMODIUM) 2 MG capsule Take by mouth as needed.     metoprolol tartrate (LOPRESSOR) 25 MG tablet Take 0.5 tablets (12.5 mg total) by mouth 2 (two) times daily. 90 tablet 3   nitroGLYCERIN (NITROSTAT) 0.4 MG SL tablet Place 1 tablet (0.4 mg total) under the tongue every 5 (five) minutes as needed for chest pain. 25 tablet 1   NONFORMULARY OR COMPOUNDED ITEM [facility]  Combination Pain Cream -  Baclofen 2%, Doxepin 5%, Gabapentin 6%, Topiramate 2%, Pentoxifylline 3% Apply 1-2 grams to affected area 3-4 times daily Qty. 120 gm 3 refills 1 each 3   nystatin (MYCOSTATIN/NYSTOP) powder Apply 1 g topically 4 (four) times daily as needed (rash).     potassium chloride SA (KLOR-CON) 20 MEQ tablet Take 1 tablet (20 mEq total) by mouth daily. 30 tablet 5   pregabalin (LYRICA) 50 MG capsule TAKE 1 CAPSULE BY MOUTH 2 TIMES DAILY. 180 capsule 1   torsemide (DEMADEX) 10 MG tablet Take 1-2 tablets (10-20 mg total) by mouth daily as needed (for weight gain greater than 2 lbs in 1 day or 5 lbs in 1 week or worsening leg swelling).     traMADol (ULTRAM) 50 MG tablet Take 0.5 tablets (25 mg total) by mouth every 12 (twelve) hours as needed for severe pain. 30 tablet 0   umeclidinium-vilanterol (ANORO ELLIPTA) 62.5-25 MCG/INH AEPB Inhale 1 puff into the lungs daily. 60 each 3   No current facility-administered medications for this encounter.    Allergies  Allergen Reactions   Demeclocycline Hives   Erythromycin Nausea And Vomiting and Other (See Comments)    Severe irritable bowel   Flagyl [Metronidazole] Nausea And Vomiting and Other (See Comments)    Severe irritable bowel   Glucophage [Metformin Hcl] Nausea And Vomiting and Other (See Comments)    "Sick" "I won't take anything that has metformin in it"   Tetracyclines & Related Hives and Rash   Diovan [Valsartan] Nausea Only        Sulfa Antibiotics Rash and Other (See Comments)    As child   Xanax [Alprazolam] Other  (See Comments)    Unknown reaction  Social History   Socioeconomic History   Marital status: Married    Spouse name: [name]    Number of children: 2   Years of education: College    Highest education level: Not on file  Occupational History    Employer: retired  Tobacco Use   Smoking status: Never   Smokeless tobacco: Never  Vaping Use   Vaping Use: Never used  Substance and Sexual Activity   Alcohol use: No   Drug use: Yes    Comment: prescribed valium   Sexual activity: Not Currently    Birth control/protection: Post-menopausal, Surgical  Other Topics Concern   Not on file  Social History Narrative   Patient lives at home with husband [name].    Right Handed   Drinks caffeinated tea occasionally   Social Determinants of Health   Financial Resource Strain: Low Risk    Difficulty of Paying Living Expenses: Not hard at all  Food Insecurity: No Food Insecurity   Worried About Charity fundraiser in the Last Year: Never true   Arboriculturist in the Last Year: Never true  Transportation Needs: No Transportation Needs   Lack of Transportation (Medical): No   Lack of Transportation (Non-Medical): No  Physical Activity: Insufficiently Active   Days of Exercise per Week: 2 days   Minutes of Exercise per Session: 40 min  Stress: Stress Concern Present   Feeling of Stress : Rather much  Social Connections: Not on file  Intimate Partner Violence: Not At Risk   Fear of Current or Ex-Partner: No   Emotionally Abused: No   Physically Abused: No   Sexually Abused: No      Family History  Problem Relation Age of Onset   Heart disease Father    Hypertension Father    Prostate cancer Father    Stroke Father    Osteoporosis Father    Stroke Mother    Depression Mother    Headache Mother    Heart disease Mother    Thyroid disease Mother    Hypertension Mother    Diabetes Daughter    Heart disease Daughter    Hypertension Daughter    Hypertension Son      Vitals:   01/11/21 0958  BP: 122/70  Pulse: 79  SpO2: 95%  Weight: 113.1 kg (249 lb 6.4 oz)    PHYSICAL EXAM: General:  Well appearing. No respiratory difficulty HEENT: normal Neck: supple. no JVD. Carotids 2+ bilat; no bruits. No lymphadenopathy or thryomegaly appreciated. Cor: PMI nondisplaced. Regular rate & rhythm. No rubs, gallops or murmurs. Lungs: clear Abdomen: soft, nontender, nondistended. No hepatosplenomegaly. No bruits or masses. Good bowel sounds. Extremities: no cyanosis, clubbing, rash, edema Neuro: alert & oriented x 3, cranial nerves grossly intact. moves all 4 extremities w/o difficulty. Affect pleasant.   ASSESSMENT & PLAN:  Chronic Diastolic CHF: -last ECHO 0/7867 EF 65-70%, G2DD, mild-mod RV dysfunction, valves okay -RHC 08/2020 above, PAPI 1.5, RV dysfunction, mildly elevated PA pressures, wedge 22, Fick CO: 3.9 L/min, Fick CI: 1.8 L/min/m^2 -NYHA class III, euvolemic on exam, REDS 30% -Currently on Farxiga 10, metoprolol tartrate 12.5 BID, Torsemide 10-20mg  PRN daily -Expect a large component of her symptoms is driven by her being overweight and deconditioned -Would benefit from aggressive weight loss, consider GLP-1RA possibly new agent tirzepatide  CAD: -unstable angina 12/2019 s/p DES x2 to mid/ostial LAD  -Repeat LHC 08/2020 Non-obstructive coronary artery disease with 40% ostial RCA stenosis. Widely patent LAD stents -On  DAPT, Statin, bb  T2DM: -Continue Farxiga -Consider GLP-1RA possibly new agent tirzepatide for weight loss   HTN: -well controlled  CKD3a: -stable, recent cr 01/06/2021 1.27  OSA: -on cpap QHS even with daytime naps  Morbid Obesity: -Body mass index is 42.15 kg/m. -discussed importance of weight loss, may benefit from GLP-1RA as outlined above  Vickki Muff, MD  Patient seen and examined with the above-signed Advanced Practice Provider and/or Housestaff. I personally reviewed laboratory data, imaging studies and  relevant notes. I independently examined the patient and formulated the important aspects of the plan. I have edited the note to reflect any of my changes or salient points. I have personally discussed the plan with the patient and/or family.  75 y/o morbidly obese woman with h/o gastric bypass followed by revision, DM2, CAD, OSA, PAH and CKD 3 referred by Dr. Saunders Revel for further evaluation of her dyspnea and HF.   ECHO 12/2019 EF 65-70%, G2DD, mild-mod RV dysfunction, valves ok RHC 2/22 RA 17 PA 40/20 (27) PCWP 22   Diuresed post cath and got AKI. But little improvement in symptoms. Wears CPAP nightly Complains of severe fatigue and SOB with minimal exertion   ReDS 30% (normal lung water 25-35%)  General:  Obese woman sitting in chair No resp difficulty HEENT: normal Neck: supple. no JVD. Carotids 2+ bilat; no bruits. No lymphadenopathy or thryomegaly appreciated. Cor: PMI nondisplaced. Regular rate & rhythm. No rubs, gallops or murmurs. Lungs: clear Abdomen: obese soft, nontender, nondistended. No hepatosplenomegaly. No bruits or masses. Good bowel sounds. Extremities: no cyanosis, clubbing, rash, edema Neuro: alert & orientedx3, cranial nerves grossly intact. moves all 4 extremities w/o difficulty. Affect pleasant  Difficult situation. She has NYHA IIIB symptoms. Work-up reveals diastolic HF, mild PAH with RV dysfunction. Currently volume status optimized and renal function has normalized with minimal improvement in symptoms  I think the main driver of her symptoms now is her longstanding obesity (she was 125 pounds when she graduated high school) and OHS. She has tried hard to lose weight and has been unsuccessful.  Unfortunately, I don't think we have much more to offer her from HF perspective as her fluid overload is just the tip of her symptom iceberg.. I advised her to adjust diuretics as needed to keep fluid status where it is at currently and consider another attempt at weight loss with  [place] and/or tirzepatide. Given her condition, I do not think she will get any meaningful symptom improvement unless she can lose about 30 pounds or more.   Glori Bickers, MD  2:10 PM

## 2021-01-11 NOTE — Progress Notes (Signed)
ReDS Vest / Clip - 01/11/21 1100       ReDS Vest / Clip   Station Marker B    Ruler Value 37    ReDS Value Range Low volume    ReDS Actual Value 30

## 2021-01-12 DIAGNOSIS — N3281 Overactive bladder: Secondary | ICD-10-CM | POA: Diagnosis not present

## 2021-01-12 DIAGNOSIS — G4733 Obstructive sleep apnea (adult) (pediatric): Secondary | ICD-10-CM | POA: Diagnosis not present

## 2021-01-12 DIAGNOSIS — E1122 Type 2 diabetes mellitus with diabetic chronic kidney disease: Secondary | ICD-10-CM | POA: Diagnosis not present

## 2021-01-12 DIAGNOSIS — R3914 Feeling of incomplete bladder emptying: Secondary | ICD-10-CM | POA: Diagnosis not present

## 2021-01-12 DIAGNOSIS — E1129 Type 2 diabetes mellitus with other diabetic kidney complication: Secondary | ICD-10-CM | POA: Diagnosis not present

## 2021-01-12 DIAGNOSIS — I509 Heart failure, unspecified: Secondary | ICD-10-CM | POA: Insufficient documentation

## 2021-01-12 DIAGNOSIS — I10 Essential (primary) hypertension: Secondary | ICD-10-CM | POA: Diagnosis not present

## 2021-01-12 DIAGNOSIS — E611 Iron deficiency: Secondary | ICD-10-CM | POA: Diagnosis not present

## 2021-01-12 DIAGNOSIS — R609 Edema, unspecified: Secondary | ICD-10-CM | POA: Diagnosis not present

## 2021-01-12 DIAGNOSIS — E7800 Pure hypercholesterolemia, unspecified: Secondary | ICD-10-CM | POA: Diagnosis not present

## 2021-01-12 DIAGNOSIS — D649 Anemia, unspecified: Secondary | ICD-10-CM | POA: Diagnosis not present

## 2021-01-12 DIAGNOSIS — R809 Proteinuria, unspecified: Secondary | ICD-10-CM | POA: Diagnosis not present

## 2021-01-12 DIAGNOSIS — E663 Overweight: Secondary | ICD-10-CM | POA: Diagnosis not present

## 2021-01-18 ENCOUNTER — Other Ambulatory Visit (HOSPITAL_COMMUNITY): Payer: Self-pay | Admitting: Nephrology

## 2021-01-18 ENCOUNTER — Encounter: Payer: Self-pay | Admitting: Neurology

## 2021-01-18 ENCOUNTER — Other Ambulatory Visit: Payer: Self-pay | Admitting: Nephrology

## 2021-01-18 ENCOUNTER — Ambulatory Visit: Payer: Medicare Other | Admitting: Neurology

## 2021-01-18 ENCOUNTER — Telehealth: Payer: Self-pay | Admitting: Neurology

## 2021-01-18 DIAGNOSIS — E1122 Type 2 diabetes mellitus with diabetic chronic kidney disease: Secondary | ICD-10-CM

## 2021-01-18 NOTE — Telephone Encounter (Signed)
This patient did not show for a revisit appointment today. 

## 2021-01-19 ENCOUNTER — Other Ambulatory Visit: Payer: Self-pay

## 2021-01-19 ENCOUNTER — Ambulatory Visit (INDEPENDENT_AMBULATORY_CARE_PROVIDER_SITE_OTHER): Payer: Medicare Other | Admitting: Pharmacist

## 2021-01-19 DIAGNOSIS — I10 Essential (primary) hypertension: Secondary | ICD-10-CM | POA: Diagnosis not present

## 2021-01-19 DIAGNOSIS — E1142 Type 2 diabetes mellitus with diabetic polyneuropathy: Secondary | ICD-10-CM

## 2021-01-19 DIAGNOSIS — I5032 Chronic diastolic (congestive) heart failure: Secondary | ICD-10-CM

## 2021-01-19 DIAGNOSIS — E1140 Type 2 diabetes mellitus with diabetic neuropathy, unspecified: Secondary | ICD-10-CM | POA: Diagnosis not present

## 2021-01-19 MED ORDER — NOVOLOG FLEXPEN 100 UNIT/ML ~~LOC~~ SOPN: PEN_INJECTOR | SUBCUTANEOUS | 3 refills | Status: DC

## 2021-01-19 MED ORDER — OZEMPIC (0.25 OR 0.5 MG/DOSE) 2 MG/1.5ML ~~LOC~~ SOPN: 0.5000 mg | PEN_INJECTOR | SUBCUTANEOUS | 2 refills | Status: DC

## 2021-01-19 NOTE — Patient Instructions (Addendum)
It was great to see you today!  Start Ozempic 0.25 mg once weekly for 4 weeks, then increase to 0.5 mg weekly. This medication may cause stomach upset, queasiness, or constipation, especially when first starting. This generally improves over time. Call our office if these symptoms occur and worsen, or if you have severe symptoms such as vomiting, diarrhea, or stomach pain.   For now, continue Lantus at 15 units every evening.   Start taking Novolog like this:  - If premeal glucose <150 give 8 units - If premeal glucose 150-200, give 10 units - If premeal glucose 200-250, give 12 units - If premeal glucose 250-300, give 14 units  - If premeal glucose >300, give 16 units  When you scan before a meal log what dose of Novolog   We can tweak this moving forward.    Visit Information  PATIENT GOALS:  Goals Addressed               This Visit's Progress     Patient Stated     Medication Monitoring (pt-stated)        Patient Goals/Self-Care Activities Over the next 90 days, patient will:  - take medications as prescribed check blood glucose at least three times daily using CGM, document, and provide at future appointments           Print copy of patient instructions, educational materials, and care plan provided in person.   Plan: Face to Face appointment with care management team member scheduled for: ~ 8 weeks  Catie Darnelle Maffucci, PharmD, [place], [place] Clinical Pharmacist [employer] [facility] [employer] [phone]

## 2021-01-19 NOTE — Progress Notes (Signed)
Cardiology Office Note:    Date:  01/20/2021   ID:  Denise Sharp, DOB [birth year], MRN [id number]  PCP:  [name], MD  [facility] Cardiologist:  [name], MD  [facility] Electrophysiologist:  None   Referring MD: [name], MD   Chief Complaint: 1 month follow-up  History of Present Illness:    Denise Sharp is a 75 y.o. female with a hx of CAD status post DES x2 to mid/ostial LAD 01/03/2020, tobacco use, hypertension, type 2 diabetes, iron deficiency anemia following gastric bypass x2, IBS, esophageal stenosis, chronic dizziness with gait instability, cervicogenic headache with cervical spine stenosis, OSA on CPAP, restless leg syndrome, adrenal mass.  Had echo 2013 for dizziness and dyspnea that showed EF 55 to 76%, normal diastolic dysfunction.  There is difficult to control hypertension on multiple agents.  2014 she underwent renal artery angiography which was normal and coronary angiography which was also normal with minimal luminal irregularities.  In 01/03/2020 she had worsening chest pain for 2 weeks which progressed to unstable angina.  She was taken for left heart cath which showed 65% ostial LAD disease, 80% mid LAD disease, LVEDP only mildly elevated, LV gram with normal EF.  She underwent PCI with DES x2 to mid/ostial LAD.  Echo showed EF 65 to 54%, grade 2 diastolic dysfunction, normal RV, no significant valvular abnormality.  Seen back in follow-up 09/04/2020 despite intervention and still felt very poorly with shortness of breath.  She was started on Lasix with little improvement.  Patient was set up for right and left heart cath which showed nonobstructive coronary disease with 40% ostial RCA stenosis, widely patent LAD stents.  Right heart cath showed RA 17 mmHg, PCWP 22 mmHg.  She was switched to torsemide, and at one point metolazone was added.  Patient saw Jorja Loa 11/02/2020 for palpitations and nausea monitor placed for 4-day showed 11  nonsustained VT longest run 11 beats, 14 SVT runs episode longest 18 beats.  She was started on metoprolol.  Patient developed AKI thought to be secondary to overdiuresis and torsemide was decreased.  No function improved but weight gain continued.  He was referred to nephrology and advanced heart failure clinic.  Patient saw advanced heart failure clinic 01/11/2021.  She reported low blood pressures, however seem to be normotensive on her log.  She was taking torsemide 20 mg daily.  Weight trend 2045-250lbs.  Was felt most of her symptoms were due to her longstanding obesity and OHS.  Today, the patient reports she had some chills last. Also had some hot flashes. She fell back asleep. She was recently taken off sleep meds and feels she is going through withdrawals from this. Some nights she gets no sleep. Physically exhausted. Reports low bps at home, 80-90s/40s. Although she says she doesn't routinely take her bps. BP log shows normotensive pressures. Saw nephrologist, she said that GFR in the 40s is good, that we can adjust torsemide as needed. She takes torsemide 20mg  daily. He recommended patient can possibly go down to the torsemide 10 mg daily. Follows no salt diet. She elevates legs.  Reported chest pain, twice in the last month. IT was heavy crushing pain, she felt short of breath, lethargic and dizzy. She was laying down. She took a SL NITRO x 1 and this relieved the pain. Pain lasts for about 5 minutes before taking the NITRO. Then she seems to fall asleep right afterwards. Also feel SOB, worse with exertion. She  suspects breathing is related to weakness. She did cardiac rehab, but stopped after a fall.    Past Medical History:  Diagnosis Date   (HFpEF) heart failure with preserved ejection fraction ([place])    a. 12/2019 Echo: EF 65-70%, Gr2 DD. No significant valvular dzs.   Abnormality of gait 03/25/2013   Adrenal mass, left ([place])    Anemia    iron deficient post  2 unit txfsn 2009, normal  endo/colonoscopy by Wohl   Arthritis    CAD (coronary artery disease) with h/o Atypical Chest Pain    a. 04/1986 Cath ([facility]): nl cors, EF 65%; b. 07/2012 Cath [facility]): Diff minor irregs; c. 07/2016 MV [facility]): "Equivocal"; d. 08/2016 Cardiac CT Ca2+ score [facility]): Ca2+ 4081; e. 05/2019 MV: No ischemia. EF 75%; f. 12/2019 PCI: LM nl, LAD 65ost (3.5x12 Resolute DES), 68m (2.75x12 Resolute DES),LCX/RCA nl; g. 08/2020 Cath: patent LAD stents, RCA 40ost, elev RH pressures, EF >65%.   Cervical spinal stenosis 1994   due to trauma to back ([employer] accident), has intermittent paralysis and parasthesias   Cervicogenic headache 03/23/2014   Depression    Dizziness    chronic dizziness   DJD (degenerative joint disease)    a. Chronic R shoulder pain pending R shoulder replacement 07/2017.   Esophageal stenosis March 2011   with transietn outlet obstruction by food, cleared by EGD    Family history of adverse reaction to anesthesia    daughter PONV   Gastric bypass status for obesity    Headache(784.0)    Hypertension    IBS (irritable bowel syndrome)    Left bundle branch block (LBBB)    a. Intermittently present - likely rate related. - patient denies   Obesity    Obstructive sleep apnea    using CPAP   Polyneuropathy in diabetes(357.2) 03/25/2013   Restless leg syndrome    Rotator cuff arthropathy, right 08/13/2017   Syncope and collapse 03/12/2014   Type II diabetes mellitus ([place])     Past Surgical History:  Procedure Laterality Date   ABDOMINAL HYSTERECTOMY     APPENDECTOMY     CARDIAC CATHETERIZATION     COLONOSCOPY     CORONARY STENT INTERVENTION N/A 01/04/2020   Procedure: CORONARY STENT INTERVENTION;  Surgeon: [name], MD;  Location: [place] [facility];  Service: Cardiovascular;  Laterality: N/A;  LAD    DIAPHRAGMATIC HERNIA REPAIR  2015   ESOPHAGEAL DILATION     multiple   ESOPHAGOGASTRODUODENOSCOPY     GALLBLADDER SURGERY  1976   GALLBLADDER SURGERY  resection   GASTRIC  BYPASS     GASTRIC BYPASS  2000, 2005   Dr. [name] IMPLANT PLACEMENT  April 2013   [place] REPLACEMENT  2007   bilateral knee. [name],  [name]   LEFT HEART CATH AND CORS/GRAFTS ANGIOGRAPHY N/A 12/31/2019   Procedure: LEFT HEART CATH AND CORS/GRAFTS ANGIOGRAPHY;  Surgeon: [name], MD;  Location: [place] [facility];  Service: Cardiovascular;  Laterality: N/A;   PANNICULECTOMY  06/16/2019   PANNICULECTOMY N/A 06/16/2019   Procedure: PANNICULECTOMY;  Surgeon: [name], MD;  Location: [place];  Service: Plastics;  Laterality: N/A;  3 hours, please   REVERSE SHOULDER ARTHROPLASTY Right 08/13/2017   Procedure: REVERSE RIGHT SHOULDER ARTHROPLASTY;  Surgeon: [name], MD;  Location: [place];  Service: Orthopedics;  Laterality: Right;   RIGHT/LEFT HEART CATH AND CORONARY ANGIOGRAPHY N/A 09/06/2020   Procedure: RIGHT/LEFT HEART CATH AND CORONARY ANGIOGRAPHY;  Surgeon: [name], MD;  Location: [place] [facility];  Service: Cardiovascular;  Laterality: N/A;   ROTATOR CUFF REPAIR     right   SPINE SURGERY  1995   [place]   TOTAL ABDOMINAL HYSTERECTOMY W/ BILATERAL SALPINGOOPHORECTOMY  2951   UMBILICAL HERNIA REPAIR  Aug 11, 2015    Current Medications: Current Meds  Medication Sig   aspirin EC 81 MG tablet Take 1 tablet (81 mg total) by mouth daily.   atorvastatin (LIPITOR) 40 MG tablet Take 1 tablet (40 mg total) by mouth daily.   clopidogrel (PLAVIX) 75 MG tablet Take 1 tablet (75 mg total) by mouth daily.   clotrimazole-betamethasone (LOTRISONE) cream Apply 1 application topically 2 (two) times daily as needed.   Continuous Blood Gluc Receiver (FREESTYLE LIBRE 2 READER) DEVI Use to check sugar at least TID   cyanocobalamin (,VITAMIN B-12,) 1000 MCG/ML injection Inject 1 mL (1,000 mcg total) into the muscle every 30 (thirty) days. Inject 1 ml (1000 mcg ) IM weekly x 4,  Then monthly thereafter   Dapagliflozin Propanediol (FARXIGA PO) Take 10 mg by  mouth daily.   diphenhydrAMINE (BENADRYL) 25 MG tablet Take 50 mg by mouth daily as needed for allergies.   diphenoxylate-atropine (LOMOTIL) 2.5-0.025 MG tablet Take 1 tablet by mouth 4 (four) times daily as needed for diarrhea or loose stools.   fexofenadine (ALLEGRA) 180 MG tablet Take 180 mg by mouth daily.   hyoscyamine (ANASPAZ) 0.125 MG TBDP disintergrating tablet DISSOLVE UNDER TONGUE 15 MINUTES BEFORE MEALS   insulin aspart (NOVOLOG FLEXPEN) 100 UNIT/ML FlexPen Inject 8-16 units up to 3 times daily before meals   insulin glargine (LANTUS SOLOSTAR) 100 UNIT/ML Solostar Pen Inject 15 Units into the skin daily.   Insulin Pen Needle (DROPLET PEN NEEDLES) 31G X 5 MM MISC USE ONE NEEDLE SUBCUTANEOUSLY AS DIRECTED (REMOVE AND DISCARD NEEDLE IN SHARPS CONTAINER IMMEDIATELY AFTER USE)   loperamide (IMODIUM) 2 MG capsule Take by mouth as needed.   metoprolol tartrate (LOPRESSOR) 25 MG tablet Take 0.5 tablets (12.5 mg total) by mouth 2 (two) times daily.   nitroGLYCERIN (NITROSTAT) 0.4 MG SL tablet Place 1 tablet (0.4 mg total) under the tongue every 5 (five) minutes as needed for chest pain.   NONFORMULARY OR COMPOUNDED [place]  Combination Pain Cream -  Baclofen 2%, Doxepin 5%, Gabapentin 6%, Topiramate 2%, Pentoxifylline 3% Apply 1-2 grams to affected area 3-4 times daily Qty. 120 gm 3 refills   nystatin (MYCOSTATIN/NYSTOP) powder Apply 1 g topically 4 (four) times daily as needed (rash).   potassium chloride SA (KLOR-CON) 20 MEQ tablet Take 1 tablet (20 mEq total) by mouth daily.   pregabalin (LYRICA) 50 MG capsule TAKE 1 CAPSULE BY MOUTH 2 TIMES DAILY.   Semaglutide,0.25 or 0.5MG /DOS, (OZEMPIC, 0.25 OR 0.5 MG/DOSE,) 2 MG/1.5ML SOPN Inject 0.5 mg into the skin once a week.   torsemide (DEMADEX) 10 MG tablet Take 1-2 tablets (10-20 mg total) by mouth daily as needed (for weight gain greater than 2 lbs in 1 day or 5 lbs in 1 week or worsening leg swelling).   traMADol (ULTRAM) 50  MG tablet Take 0.5 tablets (25 mg total) by mouth every 12 (twelve) hours as needed for severe pain.     Allergies:   Demeclocycline, Erythromycin, Flagyl [metronidazole], Glucophage [metformin hcl], Tetracyclines & related, Diovan [valsartan], Sulfa antibiotics, and Xanax [alprazolam]   Social History   Socioeconomic History   Marital status: Married    Spouse name: Denise Sharp  Number of children: 2   Years of education: College    Highest education level: Not on file  Occupational History    Employer: retired  Tobacco Use   Smoking status: Never   Smokeless tobacco: Never  Vaping Use   Vaping Use: Never used  Substance and Sexual Activity   Alcohol use: No   Drug use: Yes    Comment: prescribed valium   Sexual activity: Not Currently    Birth control/protection: Post-menopausal, Surgical  Other Topics Concern   Not on file  Social History Narrative   Patient lives at home with husband Denise Sharp.    Right Handed   Drinks caffeinated tea occasionally   Social Determinants of Health   Financial Resource Strain: Low Risk    Difficulty of Paying Living Expenses: Not hard at all  Food Insecurity: No Food Insecurity   Worried About Charity fundraiser in the Last Year: Never true   Arboriculturist in the Last Year: Never true  Transportation Needs: No Transportation Needs   Lack of Transportation (Medical): No   Lack of Transportation (Non-Medical): No  Physical Activity: Insufficiently Active   Days of Exercise per Week: 2 days   Minutes of Exercise per Session: 40 min  Stress: Stress Concern Present   Feeling of Stress : Rather much  Social Connections: Not on file     Family History: The patient's family history includes Depression in her mother; Diabetes in her daughter; Headache in her mother; Heart disease in her daughter, father, and mother; Hypertension in her daughter, father, mother, and son; Osteoporosis in her father; Prostate cancer in her father; Stroke in her  father and mother; Thyroid disease in her mother.  ROS:   Please see the history of present illness.     All other systems reviewed and are negative.  EKGs/Labs/Other Studies Reviewed:    The following studies were reviewed today:  Cardiac cath 09/06/2020 Conclusions: Non-obstructive coronary artery disease with 40% ostial RCA stenosis. Widely patient ostial/proximal and mid LAD stents, with the distal LAD tapering to a small vessel. Mildly elevated left heart and pulmonary artery pressures. Severely elevated right heart filling pressures. Low Fick cardiac output/index consistent with HFpEF.   Recommendations: Continue gentle diuresis. Encourage weight loss and exercise. Consider outpatient referral to advanced heart failure clinic for further evaluation/management of chronic HFpEF. Continue secondary prevention of coronary artery disease.   [name], MD [place]  Cardiac cath 01/04/2020  Ost LAD to Prox LAD lesion is 80% stenosed. Post intervention, there is a 0% residual stenosis. Post intervention, there is a 0% residual stenosis. A drug-eluting stent was successfully placed using a STENT RESOLUTE ONYX 3.5X12. Mid LAD lesion is 80% stenosed. A drug-eluting stent was successfully placed using a [place] [id number].   Successful PCI and drug-eluting stent placement to mid as well as ostial LAD.   Recommendations: Dual antiplatelet therapy for at least 6 months but should consider long-term dual antiplatelet therapy given that the ostial LAD stent extends a millimeter into the left main coronary artery Aggressive treatment of risk factors. Likely discharge home tomorrow if no issues.  Echo 12/30/2019 1. Left ventricular ejection fraction, by estimation, is 65 to 70%. The  left ventricle has normal function. The left ventricle has no regional  wall motion abnormalities. Left ventricular diastolic parameters are  consistent with Grade II diastolic   dysfunction (pseudonormalization).   2. Right ventricular systolic function is normal. The right ventricular  size  is normal.   3. The mitral valve is normal in structure. No evidence of mitral valve  regurgitation. No evidence of mitral stenosis.   4. The aortic valve is normal in structure. Aortic valve regurgitation is  not visualized. No aortic stenosis is present.   Heart monitor 11/15/2020   The patient was monitored for 4 days, 22 hours. The predominant rhythm was sinus with an average rate of 77 bpm (range 53-121 bpm and sinus). There were rare PACs and PVCs. 11 episodes of nonsustained ventricular tachycardia were observed, lasting up to 8 beats with a maximum rate of 210 bpm. 14 atrial runs lasting up to 18 beats with a maximum rate of 250 bpm occurred. Some episodes labeled as supraventricular tachycardia with aberrancy could represent nonsustained ventricular tachycardia. Patient triggered events correspond to sinus rhythm and sinus rhythm with PVCs and artifact.   Predominantly sinus rhythm with rare PACs and PVCs.  Several episodes of nonsustained ventricular tachycardia and supraventricular tachycardia were observed.    EKG:  EKG is ordered today.  The ekg ordered today demonstrates NSR, 73bpm, TWI aVL, Qtc 472ms  Recent Labs: 07/17/2020: ALT 12; TSH 3.695 09/28/2020: Magnesium 2.3 12/14/2020: B Natriuretic Peptide 83.4 12/28/2020: Hemoglobin 11.7; Platelets 297 01/06/2021: BUN 17; Creatinine, Ser 1.27; Potassium 3.9; Sodium 140  Recent Lipid Panel    Component Value Date/Time   CHOL 134 05/24/2020 1532   CHOL 155 02/27/2014 0420   TRIG 128.0 05/24/2020 1532   TRIG 121 02/27/2014 0420   HDL 60.40 05/24/2020 1532   HDL 43 02/27/2014 0420   CHOLHDL 2 05/24/2020 1532   VLDL 25.6 05/24/2020 1532   VLDL 24 02/27/2014 0420   LDLCALC 48 05/24/2020 1532   LDLCALC 88 02/27/2014 0420   LDLDIRECT 111.0 06/16/2015 0936     Risk Assessment/Calculations:       Physical  Exam:    VS:  BP 108/70 (BP Location: Left Arm, Patient Position: Sitting, Cuff Size: Normal)   Pulse 73   Ht 5' 4.5" (1.638 m)   Wt 244 lb (110.7 kg)   SpO2 96%   BMI 41.24 kg/m     Wt Readings from Last 3 Encounters:  01/20/21 244 lb (110.7 kg)  01/11/21 249 lb 6.4 oz (113.1 kg)  01/06/21 253 lb 12.8 oz (115.1 kg)     GEN:  Well nourished, well developed in no acute distress HEENT: Normal NECK: No JVD; No carotid bruits LYMPHATICS: No lymphadenopathy CARDIAC: RRR, no murmurs, rubs, gallops RESPIRATORY:  Clear to auscultation without rales, wheezing or rhonchi  ABDOMEN: Soft, non-tender, non-distended MUSCULOSKELETAL:  No edema; No deformity  SKIN: Warm and dry NEUROLOGIC:  Alert and oriented x 3 PSYCHIATRIC:  Normal affect   ASSESSMENT:    1. Chronic diastolic heart failure ([place])   2. Coronary artery disease involving native coronary artery of native heart without angina pectoris   3. PSVT (paroxysmal supraventricular tachycardia) ([place])   4. Dyspnea on exertion   5. Precordial pain   6. CKD stage 3 due to type 2 diabetes mellitus ([place])   7. Prolonged QT interval    PLAN:    In order of problems listed above:  Chest pain  CAD s/p DES x2 mid and ostial LAD in 12/2019 Patient reports 2 episodes of chest heaviness with associated sob relieved with SL NTG. She had cath 2/022 that showed patent stents and 40% ostial RCA lesion. EKG today with nonspecific T wave changes. I will order Myoview lexiscan stress test. It has been  1 year with DAPT since stents, cath reports says at least 6 month but consider long term DAPT, will confirm with MD ok to stop plavix. Also patient reports she will need dental surgery in the future. Continue statin and BB.  HFpEF Patient saw AHF  clinic 6/29 and felt there was not much to offer from a HF standpoint, felt some of her symptoms would improve with weight loss. Patients is euvolemic on exam today. Weight is stable at 244lbs. She eats low salt  diet and elevates legs. Per patient, Nephrologist said it is ok to adjust torsemide dose as needed. I will get a BMET today. Continue torsemide 20mg  daily for now. We will reevaluate torsemide dose at follow-up.   pSVT No reported palpitations. Continue Lopressor 12.5mg  BID  Morbid obesity/Deconditioning She started cardiac rehab, but stopped due to a mechanical fall. She says she will call to see if she can continue. Likely contributing to sob and fatigue.   Prlonged Qt Qtc 50ms by EKG. On multiple medications that can affect Qtc prescribed by PCP. Patient says she takes most of them PRN. Repeat EKG at follow-up.  HTN Normotensive today and on bp log. Continue metoprolol.   CKD stage 3 BMET today   Disposition: Follow up in 1 month(s) with APP/MD   Shared Decision Making/Informed Consent   Shared Decision Making/Informed Consent The risks [chest pain, shortness of breath, cardiac arrhythmias, dizziness, blood pressure fluctuations, myocardial infarction, stroke/transient ischemic attack, nausea, vomiting, allergic reaction, radiation exposure, metallic taste sensation and life-threatening complications (estimated to be 1 in 10,000)], benefits (risk stratification, diagnosing coronary artery disease, treatment guidance) and alternatives of a nuclear stress test were discussed in detail with [name] and she agrees to proceed.    Signed, Tadeusz Stahl Ninfa Meeker, PA-C  01/20/2021 4:56 PM    [facility]

## 2021-01-19 NOTE — Chronic Care Management (AMB) (Signed)
Chronic Care Management Pharmacy Note  01/19/2021 Name:  Denise Sharp MRN:  [id number] DOB:  [birth year]   Subjective: Denise Sharp is an 75 y.o. year old female who is a primary patient of [name], Aris Everts, MD.  The CCM team was consulted for assistance with disease management and care coordination needs.    Engaged with patient face to face for follow up visit in response to provider referral for pharmacy case management and/or care coordination services.   Consent to Services:  The patient was given information about Chronic Care Management services, agreed to services, and gave verbal consent prior to initiation of services.  Please see initial visit note for detailed documentation.   Patient Care Team: [name], MD as PCP - General (Internal Medicine) End, Harrell Gave, MD as PCP - Cardiology (Cardiology) Chiropractic, Freddi Che as Referring Physician De Hollingshead, RPH-CPP (Pharmacist) Leona Singleton, RN as Case Manager  Objective:  Lab Results  Component Value Date   CREATININE 1.27 (H) 01/06/2021   CREATININE 1.28 (H) 12/28/2020   CREATININE 1.37 (H) 12/26/2020    Lab Results  Component Value Date   HGBA1C 8.2 (A) 11/15/2020   Last diabetic Eye exam:  Lab Results  Component Value Date/Time   HMDIABEYEEXA No Retinopathy 10/15/2016 12:00 AM    Last diabetic Foot exam:  Lab Results  Component Value Date/Time   HMDIABFOOTEX abnormal 06/23/2014 12:00 AM        Component Value Date/Time   CHOL 134 05/24/2020 1532   CHOL 155 02/27/2014 0420   TRIG 128.0 05/24/2020 1532   TRIG 121 02/27/2014 0420   HDL 60.40 05/24/2020 1532   HDL 43 02/27/2014 0420   CHOLHDL 2 05/24/2020 1532   VLDL 25.6 05/24/2020 1532   VLDL 24 02/27/2014 0420   LDLCALC 48 05/24/2020 1532   LDLCALC 88 02/27/2014 0420   LDLDIRECT 111.0 06/16/2015 0936    Hepatic Function Latest Ref Rng & Units 07/17/2020 06/07/2020 06/07/2020  Total Protein 6.5 - 8.1 g/dL 7.6 - 6.9   Albumin 3.5 - 5.0 g/dL 3.5 - 3.6  AST 15 - 41 U/L 18 - 12  ALT 0 - 44 U/L 12 - 9  Alk Phosphatase 38 - 126 U/L 129(H) 153(H) 134(H)  Total Bilirubin 0.3 - 1.2 mg/dL 0.5 - 0.3  Bilirubin, Direct 0.0 - 0.3 mg/dL - - 0.1    Lab Results  Component Value Date/Time   TSH 3.695 07/17/2020 11:50 AM   TSH 3.50 01/01/2019 01:45 PM   TSH 2.279 08/20/2017 05:01 AM   TSH 4.400 06/16/2015 09:36 AM   FREET4 1.10 05/03/2015 04:20 PM    CBC Latest Ref Rng & Units 12/28/2020 12/14/2020 08/31/2020  WBC 4.0 - 10.5 K/uL 10.9(H) 13.7(H) 9.7  Hemoglobin 12.0 - 15.0 g/dL 11.7(L) 12.5 13.7  Hematocrit 36.0 - 46.0 % 36.7 38.3 41.2  Platelets 150 - 400 K/uL 297 304 277    Lab Results  Component Value Date/Time   VD25OH 21.65 (L) 06/04/2017 02:12 PM   VD25OH 23.44 (L) 11/19/2016 02:31 PM    Clinical ASCVD: Yes  The 10-year ASCVD risk score Mikey Bussing DC Jr., et al., 2013) is: 46.1%   Values used to calculate the score:     Age: 11 years     Sex: Female     Is Non-Hispanic African American: No     Diabetic: Yes     Tobacco smoker: Yes     Systolic Blood Pressure: 831 mmHg  Is BP treated: Yes     HDL Cholesterol: 60.4 mg/dL     Total Cholesterol: 134 mg/dL    Social History   Tobacco Use  Smoking Status Never  Smokeless Tobacco Never   BP Readings from Last 3 Encounters:  01/11/21 122/70  01/06/21 126/74  12/28/20 111/67   Pulse Readings from Last 3 Encounters:  01/11/21 79  01/06/21 84  12/28/20 67   Wt Readings from Last 3 Encounters:  01/11/21 249 lb 6.4 oz (113.1 kg)  01/06/21 253 lb 12.8 oz (115.1 kg)  12/28/20 257 lb 3.2 oz (116.7 kg)    Assessment: Review of patient past medical history, allergies, medications, health status, including review of consultants reports, laboratory and other test data, was performed as part of comprehensive evaluation and provision of chronic care management services.   SDOH:  (Social Determinants of Health) assessments and interventions performed:     CCM Care Plan  Allergies  Allergen Reactions   Demeclocycline Hives   Erythromycin Nausea And Vomiting and Other (See Comments)    Severe irritable bowel   Flagyl [Metronidazole] Nausea And Vomiting and Other (See Comments)    Severe irritable bowel   Glucophage [Metformin Hcl] Nausea And Vomiting and Other (See Comments)    "Sick" "I won't take anything that has metformin in it"   Tetracyclines & Related Hives and Rash   Diovan [Valsartan] Nausea Only        Sulfa Antibiotics Rash and Other (See Comments)    As child   Xanax [Alprazolam] Other (See Comments)    Unknown reaction    Medications Reviewed Today     Reviewed by Stanford Scotland, RN (Registered Nurse) on 01/11/21 at [place] List Status: <None>   Medication Order Taking? Sig Documenting Provider Last Dose Status Informant  albuterol (VENTOLIN HFA) 108 (90 Base) MCG/ACT inhaler [id number] Yes Inhale 2 puffs into the lungs every 8 (eight) hours as needed for wheezing or shortness of breath. [name], MD Taking Active Self  aspirin EC 81 MG tablet [id number] Yes Take 1 tablet (81 mg total) by mouth daily. Fritzi Mandes, MD Taking Active Self  atorvastatin (LIPITOR) 40 MG tablet [id number] Yes Take 1 tablet (40 mg total) by mouth daily. [name], MD Taking Active   clopidogrel (PLAVIX) 75 MG tablet [id number] Yes Take 1 tablet (75 mg total) by mouth daily. Rise Mu, PA-C Taking Active Self  clotrimazole-betamethasone Donalynn Furlong) cream [id number] Yes Apply 1 application topically 2 (two) times daily as needed. [provider] Taking Active Self  Continuous Blood Gluc Receiver (FREESTYLE LIBRE 2 READER) DEVI [id number] Yes Use to check sugar at least TID [name], MD Taking Active   cyanocobalamin (,VITAMIN B-12,) 1000 MCG/ML injection [id number] Yes Inject 1 mL (1,000 mcg total) into the muscle every 30 (thirty) days. Inject 1 ml (1000 mcg ) IM weekly x 4,  Then monthly thereafter [name], MD Taking Active   Dapagliflozin Propanediol (FARXIGA PO) [id number] Yes Take 20 mg by mouth daily. [provider] Taking Active   diphenhydrAMINE (BENADRYL) 25 MG tablet [id number] Yes Take 50 mg by mouth daily as needed for allergies. [provider] Taking Active   diphenoxylate-atropine (LOMOTIL) 2.5-0.025 MG tablet [id number] Yes Take 1 tablet by mouth 4 (four) times daily as needed for diarrhea or loose stools. [name], MD Taking Active   fexofenadine [facility]) 180 MG tablet [id number] Yes Take 180 mg by mouth daily. [provider] Taking Active   hyoscyamine (ANASPAZ) 0.125 MG TBDP disintergrating tablet [id number] Yes DISSOLVE UNDER TONGUE 15 MINUTES BEFORE MEALS [name], MD Taking Active Self  insulin aspart (NOVOLOG FLEXPEN) 100 UNIT/ML FlexPen [id number] Yes Inject 8 units up to 3 times daily before meals [name], MD Taking Active Self           Med Note [facility], BRANDY L   Fri Sep 09, 2020  2:00 PM)    insulin glargine (LANTUS SOLOSTAR) 100 UNIT/ML Solostar Pen [id number] Yes Inject 15 Units into the skin daily. [name], MD Taking Active   Insulin Pen Needle (DROPLET PEN NEEDLES) 31G X 5 MM MISC [id number] Yes USE ONE NEEDLE SUBCUTANEOUSLY AS DIRECTED (REMOVE AND DISCARD NEEDLE IN SHARPS CONTAINER IMMEDIATELY AFTER USE) [name], MD Taking Active Self  ipratropium (ATROVENT) 0.06 % nasal spray [id number] Yes Place 2 sprays into both nostrils 4 (four) times daily as needed for rhinitis. [provider] Taking Active Self  loperamide (IMODIUM) 2 MG capsule [id number] Yes Take by mouth as needed. [provider] Taking Active   metoprolol tartrate (LOPRESSOR) 25 MG tablet [id number] Yes Take 0.5 tablets (12.5 mg total) by mouth 2 (two) times daily. Theora Gianotti, NP Taking Active            Med Note [facility], [place]   Wed Dec 14, 2020  3:43 PM)    nitroGLYCERIN (NITROSTAT) 0.4 MG SL  tablet [id number] Yes Place 1 tablet (0.4 mg total) under the tongue every 5 (five) minutes as needed for chest pain. Theora Gianotti, NP Taking Active   NONFORMULARY OR COMPOUNDED [place] [id number] Yes [facility]  Combination Pain Cream -  Baclofen 2%, Doxepin 5%, Gabapentin 6%, Topiramate 2%, Pentoxifylline 3% Apply 1-2 grams to affected area 3-4 times daily Qty. 120 gm 3 refills Gardiner Barefoot, DPM Taking Active            Med Note [facility], BRANDY L   Fri Sep 02, 2020  2:40 PM)    nystatin (MYCOSTATIN/NYSTOP) powder [id number] Yes Apply 1 g topically 4 (four) times daily as needed (rash). [provider] Taking Active   potassium chloride SA (KLOR-CON) 20 MEQ tablet [id number] Yes Take 1 tablet (20 mEq total) by mouth daily. End, Harrell Gave, MD Taking Active Self  pregabalin (LYRICA) 50 MG capsule [id number] Yes TAKE 1 CAPSULE BY MOUTH 2 TIMES DAILY. Kathrynn Ducking, MD Taking Active   torsemide [facility]) 10 MG tablet [id number] Yes Take 1-2 tablets (10-20 mg total) by mouth daily as needed (for weight gain greater than 2 lbs in 1 day or 5 lbs in 1 week or worsening leg swelling). End, Harrell Gave, MD Taking Active   traMADol (ULTRAM) 50 MG tablet [id number] Yes Take 0.5 tablets (25 mg total) by mouth every 12 (twelve) hours as needed for severe pain. Elmarie Shiley, MD Taking Active            Med Note [facility], BRANDY L   Fri Sep 02, 2020  2:40 PM)    umeclidinium-vilanterol [facility] ELLIPTA) 62.5-25 MCG/INH AEPB [id number] Yes Inhale 1 puff into the lungs daily. [name], MD Taking Active Self  Med List Note Jaymes Graff 07/22/17 1307): CPAP            Patient Active Problem List   Diagnosis Date Noted   PSVT (paroxysmal supraventricular tachycardia) ([place]) 12/15/2020   CKD stage 3 due to type 2  diabetes mellitus ([place]) 12/09/2020   Temporal arteritis syndrome ([place]) 11/17/2020   Other pulmonary embolism with acute cor  pulmonale, unspecified chronicity ([place]) 11/17/2020   Mild episode of recurrent major depressive disorder ([place]) 11/17/2020   Hyperlipidemia associated with type 2 diabetes mellitus ([place]) 08/21/2020   Iron deficiency 06/28/2020   Skin lesion of chest wall 06/10/2020   Elevated alkaline phosphatase level 06/10/2020   Coagulation defect ([place]) 06/06/2020   Chronic heart failure with preserved ejection fraction (HFpEF) ([place]) 01/14/2020   Hyperlipidemia LDL goal <70 01/14/2020   Coronary artery disease involving native coronary artery of native heart without angina pectoris 01/13/2020   S/P cardiac catheterization 01/13/2020   Accelerating angina (HCC) 12/31/2019   Dysphagia 11/30/2019   Bladder pain 08/23/2019   Gastritis 08/07/2019   Lab test negative for COVID-19 virus 08/07/2019   S/P panniculectomy 06/16/2019   Major depressive disorder with current active episode 05/30/2019   Exposure to COVID-19 virus 12/25/2018   Functional diarrhea 10/22/2018   Bilateral cataracts 06/26/2018   Status post bariatric surgery 02/05/2018   GAD (generalized anxiety disorder) 02/01/2018   Rectocele 01/24/2018   Vaginal prolapse 01/04/2018   Hepatic steatosis 01/04/2018   Microalbuminuria due to type 2 diabetes mellitus ([place]) 11/21/2017   Hospital discharge follow-up 09/22/2017   Hypocalcemia 09/22/2017   Hypotension 09/22/2017   Fecal incontinence 09/22/2017   B12 deficiency 09/22/2017   Transient disorientation 08/20/2017   Rotator cuff arthropathy, right 08/13/2017   Primary localized osteoarthrosis of shoulder 08/13/2017   Encounter for preoperative examination for general surgical procedure 08/03/2017   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 ([place]) 08/03/2017   Candidiasis, intertrigo 08/03/2017   Venous stasis dermatitis of right lower extremity 11/21/2016   Hypersomnia, persistent 06/18/2015   Mechanical breakdown of implanted electronic neurostimulator of peripheral nerve  ([place]) 10/16/2014   Obesity hypoventilation syndrome ([place]) 08/13/2014   Frequent falls 06/23/2014   Chronic cough 05/05/2014   Adenoma of left adrenal gland 03/24/2014   New onset of headaches after age 67 03/23/2014   Vitamin D deficiency 01/06/2014   Weight gain following gastric bypass surgery 12/03/2013   Morbid obesity ([place]) 08/25/2013   Diaphragmatic hernia 04/06/2013   Diabetic polyneuropathy ([place]) 03/25/2013   Abnormality of gait 03/25/2013   Multiple pulmonary nodules 03/20/2013   DOE (dyspnea on exertion) 03/20/2013   Iron deficiency anemia 08/06/2012   Functional disorder of bladder 07/15/2012   Incomplete bladder emptying 07/15/2012   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[place]) 07/15/2012   Urge incontinence 07/15/2012   Increased frequency of urination 07/15/2012   OSA on CPAP 03/02/2012   Insomnia secondary to anxiety 02/29/2012   Sciatica 09/05/2011   Cervical stenosis of spinal canal 06/14/2011   Restless legs syndrome 03/13/2011   Essential hypertension 03/12/2011   Type 2 diabetes mellitus with diabetic neuropathy, unspecified ([place]) 03/12/2011   Hearing loss, right 03/12/2011    Immunization History  Administered Date(s) Administered   Fluad Quad(high Dose 65+) 03/06/2019, 06/07/2020   Influenza Split 06/13/2011   Influenza, High Dose Seasonal PF 08/21/2018   Influenza,inj,Quad PF,6+ Mos 05/04/2014, 05/12/2015   Influenza-Unspecified 04/15/2012   Moderna Sars-Covid-2 Vaccination 02/02/2020, 03/01/2020, 09/01/2020   Pneumococcal Conjugate-13 08/11/2013, 01/06/2014   Pneumococcal Polysaccharide-23 01/19/2010, 09/03/2018   Tdap 04/18/2016, 11/20/2020    Conditions to be addressed/monitored: CHF, HLD, and DMII  Care Plan : Medication Management  Updates made by De Hollingshead, RPH-CPP since 01/19/2021 12:00 AM     Problem: Diabetes, CHF      Long-Range Goal:  Disease Progression Prevention   This Visit's Progress: On track  Recent Progress: On track   Priority: High  Note:   Current Barriers:  Unable to achieve control of diabetes   Pharmacist Clinical Goal(s):  Over the next 90 days, patient will achieve control of diabetes as evidenced by improvement in A1c through collaboration with PharmD and provider  Interventions: 1:1 collaboration with [name], MD regarding development and update of comprehensive plan of care as evidenced by provider attestation and co-signature Inter-disciplinary care team collaboration (see longitudinal plan of care) Comprehensive medication review performed; medication list updated in electronic medical record  Diabetes: Uncontrolled; current treatment: Farxiga 10 mg daily, Lantus 15 units QAM, Novolog 8-16 units up to TID with meals  Hx intolerance to metformin d/t diarrhea Hx Trulicity - exacerbated diarrhea  Hx glipizide - stopped previously when A1c was well controlled  Hx Januvia - stopped previously when Trulicity was started  Avoiding TZD due to HF Current glucose readings: using Libre 2 Cgm Agree with cardiology that weight loss would be preferable. Discussed that we had tried GLP1 with Trulicity in the past and there was a question that it maybe exacerbated diarrhea. Patient reports that she still struggles with diarrhea. Discussed Ozempic vs Mounjaro. Uncertain Medicare coverage for Mounjaro at this time. Counseled on GLP1 agonists, including mechanism of action, side effects, and benefits. No personal or family history of medullary thyroid cancer. Counseled on potential side effects of nausea, stomach upset, queasiness, constipation, and that these generally improve over time. Advised to contact our office with more severe symptoms, including nausea, diarrhea, stomach pain. Patient verbalized understanding. Start Ozempic 0.25 mg weekly for 4 weeks, then increase to 0.5 mg weekly. Given free 30 day supply coupon card to take to the pharmacy. Advised that constipation is more commonly seen due to  delayed gastric emptying. Discussed to reduce doses of anti-diarrheals if constipation develops.  Patient reports stress and exhaustion regarding trying to balance a low carbohydrate, low sodium, low fluid diet. Provided empathetic listening.   HFpEF in the setting of CKD: Appropriately managed; current regimen: torsemide 10-20 mg daily (based on fluid status), potassium 20 mEq daily, metoprolol tartrate 12.5 mg BID; established with cardiology, advanced HF clinic, nephrology BP at goal today in office. Reports very low readings at home. Encouraged her to discuss this with cardiology tomorrow.  Continue current regimen at this time.   Hyperlipidemia, secondary ASCVD prevention: Controlled per last lab result; current treatment: atorvastatin 40 mg daily  Current antiplatelet regimen: aspirin 81 mg daily, clopidogrel 75 mg daily Recommended to continue current regimen at this time along with cardiology collaboration. Discussed intended duration of DAPT with cardiology.   Depression/Anxiety with Insomnia: Uncontrolled. Patient requested to discontinue any CNS sedating medications, including citalopram, bupropion, diazepam, and Lunesta. Reports she has not been sleeping well and is exhausted throughout the day Consider reinitiation of SSRI and/or bupropion, as these are least likely to be causing sedation, if patient requests moving forward. Agree with trial without diazepam and Lunesta.   Chronic Diarrhea, S/p Gastric Bypass, Esophageal "growth" Moderately well managed, given patient history. Current regimen: hyoscyamine 10 mg QID PRN; also using lomotil 4 tabs daily and loperamide BID Notes that she needs her esophagus stretched, but is awaiting cardiology clearance to be able to hold clopidogrel. Recommended to continue current regimen at this time. Monitor for any constipation with addition of Ozempic that would allow for patient to reduce doses of anti-diarrheal agents  Nocturnal "leg cramps",  as well as chronic pain More exacerbated recently per patient report. Current regimen: pregabalin 100 mg BID Also reports tremors, severe cramps. Missed appointment with neurology yesterday.  Encouraged to call [facility] to reschedule follow up Previously recommended to continue current regimen at this time. Monitor for increased sedation with fluctuations in renal function  COPD:  Well controlled; Current regimen: Anoro 62.5/25 mcg daily, albuterol HFA PRN Previously recommended to continue current regimen at this time.  Patient Goals/Self-Care Activities Over the next 90 days, patient will:  - take medications as prescribed check blood glucose at least three times daily using CGM, document, and provide at future appointments  Follow Up Plan: Face to Face appointment with care management team member scheduled for:  ~ 8 weeks      Medication Assistance: None required.  Patient affirms current coverage meets needs.  Patient's preferred pharmacy is:  [name] [place]: [phone]: [phone] [facility] [place] [address] [place] [address]Phone: [phone]: [phone] [facility] [place] [address]Phone: [phone]: [phone]   Follow Up:  Patient agrees to Care Plan and Follow-up.  Plan: Face to Face appointment with care management team member scheduled for: ~ 8 weeks  Catie TDarnelle Maffucci PharmD, BLyncourt CPevelyClinical Pharmacist LOccidental Petroleumat BJohnson & [phone]

## 2021-01-20 ENCOUNTER — Ambulatory Visit (INDEPENDENT_AMBULATORY_CARE_PROVIDER_SITE_OTHER): Payer: Medicare Other | Admitting: Medical

## 2021-01-20 ENCOUNTER — Encounter: Payer: Self-pay | Admitting: Medical

## 2021-01-20 VITALS — BP 108/70 | HR 73 | Ht 64.5 in | Wt 244.0 lb

## 2021-01-20 DIAGNOSIS — I2510 Atherosclerotic heart disease of native coronary artery without angina pectoris: Secondary | ICD-10-CM

## 2021-01-20 DIAGNOSIS — R0600 Dyspnea, unspecified: Secondary | ICD-10-CM | POA: Diagnosis not present

## 2021-01-20 DIAGNOSIS — I5032 Chronic diastolic (congestive) heart failure: Secondary | ICD-10-CM

## 2021-01-20 DIAGNOSIS — N183 Chronic kidney disease, stage 3 unspecified: Secondary | ICD-10-CM | POA: Diagnosis not present

## 2021-01-20 DIAGNOSIS — I471 Supraventricular tachycardia: Secondary | ICD-10-CM

## 2021-01-20 DIAGNOSIS — E1122 Type 2 diabetes mellitus with diabetic chronic kidney disease: Secondary | ICD-10-CM | POA: Diagnosis not present

## 2021-01-20 DIAGNOSIS — R9431 Abnormal electrocardiogram [ECG] [EKG]: Secondary | ICD-10-CM

## 2021-01-20 DIAGNOSIS — R072 Precordial pain: Secondary | ICD-10-CM

## 2021-01-20 DIAGNOSIS — R0609 Other forms of dyspnea: Secondary | ICD-10-CM

## 2021-01-20 NOTE — Patient Instructions (Addendum)
Medication Instructions:  - Your physician recommends that you continue on your current medications as directed. Please refer to the Current Medication list given to you today.  *If you need a refill on your cardiac medications before your next appointment, please call your pharmacy*   Lab Work: - Your physician recommends that you have lab work today: BMP   If you have labs (blood work) drawn today and your tests are completely normal, you will receive your results only by: MyChart Message (if you have MyChart) OR A paper copy in the mail If you have any lab test that is abnormal or we need to change your treatment, we will call you to review the results.   Testing/Procedures: -Your physician has requested that you have a lexiscan myoview.   [place]  Your caregiver has ordered a Stress Test with nuclear imaging. The purpose of this test is to evaluate the blood supply to your heart muscle. This procedure is referred to as a "Non-Invasive Stress Test." This is because other than having an IV started in your vein, nothing is inserted or "invades" your body. Cardiac stress tests are done to find areas of poor blood flow to the heart by determining the extent of coronary artery disease (CAD). Some patients exercise on a treadmill, which naturally increases the blood flow to your heart, while others who are  unable to walk on a treadmill due to physical limitations have a pharmacologic/chemical stress agent called Lexiscan . This medicine will mimic walking on a treadmill by temporarily increasing your coronary blood flow.   Please note: these test may take anywhere between 2-4 hours to complete  PLEASE REPORT TO [place] AT THE FIRST DESK WILL DIRECT YOU WHERE TO GO  Date of Procedure:_____________________________________  Arrival Time for Procedure:______________________________  Instructions regarding medication:   __x__ : Hold all diabetes  medications (oral & insulin) the morning of procedure  __x__:  Hold betablocker(s) night before procedure and morning of procedure (METOPROLOL)  __x__:  Hold TORSEMIDE the morning of your procedure  __x___: Dennis Bast may take all of your other regular morning medications not listed above the morning of your test with enough water to get them down safely  PLEASE NOTIFY THE OFFICE AT LEAST 24 HOURS IN ADVANCE IF YOU ARE UNABLE TO [place].  [phone] AND  PLEASE NOTIFY NUCLEAR MEDICINE AT [facility] AT LEAST 24 HOURS IN ADVANCE IF YOU ARE UNABLE TO KEEP YOUR APPOINTMENT. [phone]  How to prepare for your Myoview test:  Do not eat or drink after midnight No caffeine for 24 hours prior to test No smoking 24 hours prior to test. Your medication may be taken with water.  If your doctor stopped a medication because of this test, do not take that medication. Ladies, please do not wear dresses.  Skirts or pants are appropriate. Please wear a short sleeve shirt. No perfume, cologne or lotion. Wear comfortable walking shoes. No heels!   Follow-Up: At [facility], you and your health needs are our priority.  As part of our continuing mission to provide you with exceptional heart care, we have created designated Provider Care Teams.  These Care Teams include your primary Cardiologist (physician) and Advanced Practice Providers (APPs -  Physician Assistants and Nurse Practitioners) who all work together to provide you with the care you need, when you need it.  We recommend signing up for the patient portal called "MyChart".  Sign up information is provided  on this After Visit Summary.  MyChart is used to connect with patients for Virtual Visits (Telemedicine).  Patients are able to view lab/test results, encounter notes, upcoming appointments, etc.  Non-urgent messages can be sent to your provider as well.   To learn more about what you can do with MyChart, go to [email or web address].     Your next appointment:   After Cardiac testing is completed   The format for your next appointment:   In Person  Provider:   You may see [name], MD or one of the following Advanced Practice Providers on your designated Care Team:   Murray Hodgkins, NP Christell Faith, PA-C Marrianne Mood, PA-C Cadence Kathlen Mody, [place]   Other Instructions  Cardiac Nuclear Scan A cardiac nuclear scan is a test that is done to check the flow of blood to your heart. It is done when you are resting and when you are exercising. The test looks for problems such as: Not enough blood reaching a portion of the heart. The heart muscle not working as it should. You may need this test if: You have heart disease. You have had lab results that are not normal. You have had heart surgery or a balloon procedure to open up blocked arteries (angioplasty). You have chest pain. You have shortness of breath. In this test, a special dye (tracer) is put into your bloodstream. The tracer will travel to your heart. A camera will then take pictures of your heart to see how the tracer moves through yourheart. This test is usually done at a hospital and takes 2-4 hours. Tell a doctor about: Any allergies you have. All medicines you are taking, including vitamins, herbs, eye drops, creams, and over-the-counter medicines. Any problems you or family members have had with anesthetic medicines. Any blood disorders you have. Any surgeries you have had. Any medical conditions you have. Whether you are pregnant or may be pregnant. What are the risks? Generally, this is a safe test. However, problems may occur, such as: Serious chest pain and heart attack. This is only a risk if the stress portion of the test is done. Rapid heartbeat. A feeling of warmth in your chest. This feeling usually does not last long. Allergic reaction to the tracer. What happens before the test? Ask your doctor about changing or stopping your  normal medicines. This is important. Follow instructions from your doctor about what you cannot eat or drink. Remove your jewelry on the day of the test. What happens during the test? An IV tube will be inserted into one of your veins. Your doctor will give you a small amount of tracer through the IV tube. You will wait for 20-40 minutes while the tracer moves through your bloodstream. Your heart will be monitored with an electrocardiogram (ECG). You will lie down on an exam table. Pictures of your heart will be taken for about 15-20 minutes. You may also have a stress test. For this test, one of these things may be done: You will be asked to exercise on a treadmill or a stationary bike. You will be given medicines that will make your heart work harder. This is done if you are unable to exercise. When blood flow to your heart has peaked, a tracer will again be given through the IV tube. After 20-40 minutes, you will get back on the exam table. More pictures will be taken of your heart. Depending on the tracer that is used, more pictures may need to be taken 3-4  hours later. Your IV tube will be removed when the test is over. The test may vary among doctors and hospitals. What happens after the test? Ask your doctor: Whether you can return to your normal schedule, including diet, activities, and medicines. Whether you should drink more fluids. This will help to remove the tracer from your body. Drink enough fluid to keep your pee (urine) pale yellow. Ask your doctor, or the department that is doing the test: When will my results be ready? How will I get my results? Summary A cardiac nuclear scan is a test that is done to check the flow of blood to your heart. Tell your doctor whether you are pregnant or may be pregnant. Before the test, ask your doctor about changing or stopping your normal medicines. This is important. Ask your doctor whether you can return to your normal activities. You  may be asked to drink more fluids. This information is not intended to replace advice given to you by your health care provider. Make sure you discuss any questions you have with your healthcare provider. Document Revised: 10/22/2018 Document Reviewed: 12/16/2017 Elsevier Patient Education  [place].

## 2021-01-21 LAB — BASIC METABOLIC PANEL
BUN/Creatinine Ratio: 24 (ref 12–28)
BUN: 48 mg/dL — ABNORMAL HIGH (ref 8–27)
CO2: 25 mmol/L (ref 20–29)
Calcium: 9.4 mg/dL (ref 8.7–10.3)
Chloride: 86 mmol/L — ABNORMAL LOW (ref 96–106)
Creatinine, Ser: 1.97 mg/dL — ABNORMAL HIGH (ref 0.57–1.00)
Glucose: 299 mg/dL — ABNORMAL HIGH (ref 65–99)
Potassium: 3.5 mmol/L (ref 3.5–5.2)
Sodium: 137 mmol/L (ref 134–144)
eGFR: 26 mL/min/{1.73_m2} — ABNORMAL LOW (ref >59)

## 2021-01-23 ENCOUNTER — Other Ambulatory Visit: Payer: Self-pay | Admitting: Nephrology

## 2021-01-23 ENCOUNTER — Other Ambulatory Visit: Payer: Self-pay | Admitting: *Deleted

## 2021-01-23 DIAGNOSIS — I2510 Atherosclerotic heart disease of native coronary artery without angina pectoris: Secondary | ICD-10-CM

## 2021-01-23 DIAGNOSIS — N183 Chronic kidney disease, stage 3 unspecified: Secondary | ICD-10-CM

## 2021-01-23 DIAGNOSIS — E1122 Type 2 diabetes mellitus with diabetic chronic kidney disease: Secondary | ICD-10-CM

## 2021-01-23 DIAGNOSIS — I5032 Chronic diastolic (congestive) heart failure: Secondary | ICD-10-CM

## 2021-01-23 DIAGNOSIS — N185 Chronic kidney disease, stage 5: Secondary | ICD-10-CM

## 2021-01-23 MED ORDER — TORSEMIDE 10 MG PO TABS: 10.0000 mg | ORAL_TABLET | Freq: Every day | ORAL | Status: DC | PRN

## 2021-01-25 ENCOUNTER — Ambulatory Visit: Payer: Medicare Other | Admitting: *Deleted

## 2021-01-25 DIAGNOSIS — I5032 Chronic diastolic (congestive) heart failure: Secondary | ICD-10-CM

## 2021-01-25 DIAGNOSIS — E1140 Type 2 diabetes mellitus with diabetic neuropathy, unspecified: Secondary | ICD-10-CM

## 2021-01-25 NOTE — Chronic Care Management (AMB) (Signed)
  Care Management   Follow Up Note   01/25/2021 Name: Denise Sharp MRN: [id number] DOB: 01-Nov-1945   Referred by: [name], MD Reason for referral : Case Closure (CCM RNCM Discipline Closure)   Successful telephone outreach to patient for CCM RNCM follow up.   Patient answered and stated she would like to discontinue RNCM CCM services.  Agrees to continue with CCM Pharmacist.  Follow Up Plan: No further follow up required: As patient is declining CCM RNCM services at this time.  Continues to work with CCM Pharmacist. [place].   Hubert Azure RN, MSN RN Care Management Coordinator [place] [phone] [email or web address]

## 2021-01-25 NOTE — Patient Instructions (Signed)
Visit Information ? ?Thank you for allowing me to share the care management and care coordination services that are available to you as part of your health plan and services through your primary care provider and medical home. Please reach out to me at [phone]   if the care management/care coordination team may be of assistance to you in the future.  ? ?Indie Boehne RN, MSN ?RN Care Management Coordinator ?[facility] ?[phone] ?[email or web address] ? ?

## 2021-01-26 ENCOUNTER — Other Ambulatory Visit: Payer: Self-pay

## 2021-01-26 ENCOUNTER — Encounter
Admission: RE | Admit: 2021-01-26 | Discharge: 2021-01-26 | Disposition: A | Discharge status: Home / Self Care | Payer: Medicare Other | Source: Ambulatory Visit | Attending: Medical | Admitting: Medical

## 2021-01-26 ENCOUNTER — Telehealth: Payer: Self-pay | Admitting: Internal Medicine

## 2021-01-26 ENCOUNTER — Telehealth: Payer: Self-pay

## 2021-01-26 DIAGNOSIS — R072 Precordial pain: Secondary | ICD-10-CM | POA: Diagnosis not present

## 2021-01-26 MED ORDER — TECHNETIUM TC 99M TETROFOSMIN IV KIT
30.0000 | PACK | Freq: Once | INTRAVENOUS | Status: AC | PRN
  Administered 2021-01-26: 29.29 via INTRAVENOUS

## 2021-01-26 MED ORDER — TECHNETIUM TC 99M TETROFOSMIN IV KIT
10.0000 | PACK | Freq: Once | INTRAVENOUS | Status: AC | PRN
  Administered 2021-01-26: 10.56 via INTRAVENOUS

## 2021-01-26 MED ORDER — REGADENOSON 0.4 MG/5ML IV SOLN
0.4000 mg | Freq: Once | INTRAVENOUS | Status: AC
  Administered 2021-01-26: 0.4 mg via INTRAVENOUS

## 2021-01-26 NOTE — Telephone Encounter (Signed)
Official stress test results still pending.  Hopefully results will be back later tonight or tomorrow.  I will defer management to her hematuria to her nephrologist.  Denise Bush, MD [facility]

## 2021-01-26 NOTE — Telephone Encounter (Signed)
Patients states she has about a cup of blood in the toilet coming from he vagina. I transferred the call to [facility] . Access nurse would not pick up.

## 2021-01-26 NOTE — Telephone Encounter (Signed)
Pt called and stated that she has been bleeding with urination since yesterday. Pt stated that the amount is any where from a cup to a 1/4 of cup at a time. She stated that she went by her kidney doctor today to see if they would see and they stated that she would need to see a nephrologist but that see needs to go to the ED as well. Pt went on home because at the time the bleeding had stopped. She went for a nuclear stress test today and since then she has urinated twice and both times has had about a 1/2 cup of blood. She also stated that when she brushed her teeth this morning when she spit there was dark blood in the sink. I advised pt that she needs to be seen at the ED. Pt stated that as soon as her husband got home she would have him take her.

## 2021-01-26 NOTE — Telephone Encounter (Signed)
Called patient and spoke with her. She had informed me that she just got off of the phone with the nephrology doctor, Dr. Lanora Manis and he told her with the blood in her urine she needs to go to the ER. I advised the same. I also advised that she go ahead and hold her Torsemide, as I have read in Dr. Darnelle Bos OV note and Cadence Furth's OV note she was to be using prn for gentle diuresing and the patient stated that she has been taking Torsemide 10 MG daily, and using 20 MG on days she feels she needs it. I reviewed the daily / weekly weighing for managing prn use and informed her that she should hold it for now with the blood in her urine.   The patient stated that she does not want to go to the ER now and will go in the morning or the middle of the night if she has more blood in her urine.  I advised she should go no matter what, and patient stated that she will, but wants to take a nap first as the stress test wore her out this morning.  Patient thanked me for the call back, and stated she will follow up after her ER visit.

## 2021-01-26 NOTE — Telephone Encounter (Signed)
Patient calling  Patient had stress test today and wants to know how to  proceed with fluid pill Also noticed there is blood in urine  Please call to discuss

## 2021-01-27 ENCOUNTER — Emergency Department: Payer: Medicare Other

## 2021-01-27 ENCOUNTER — Telehealth: Payer: Self-pay | Admitting: Internal Medicine

## 2021-01-27 ENCOUNTER — Other Ambulatory Visit: Payer: Self-pay

## 2021-01-27 ENCOUNTER — Emergency Department
Admission: EM | Admit: 2021-01-27 | Discharge: 2021-01-27 | Disposition: A | Discharge status: Home / Self Care | Payer: Medicare Other | Attending: Emergency Medicine | Admitting: Emergency Medicine

## 2021-01-27 DIAGNOSIS — R319 Hematuria, unspecified: Secondary | ICD-10-CM | POA: Insufficient documentation

## 2021-01-27 DIAGNOSIS — I2510 Atherosclerotic heart disease of native coronary artery without angina pectoris: Secondary | ICD-10-CM | POA: Insufficient documentation

## 2021-01-27 DIAGNOSIS — I509 Heart failure, unspecified: Secondary | ICD-10-CM | POA: Diagnosis not present

## 2021-01-27 DIAGNOSIS — Z79899 Other long term (current) drug therapy: Secondary | ICD-10-CM | POA: Insufficient documentation

## 2021-01-27 DIAGNOSIS — N183 Chronic kidney disease, stage 3 unspecified: Secondary | ICD-10-CM | POA: Diagnosis not present

## 2021-01-27 DIAGNOSIS — Z7902 Long term (current) use of antithrombotics/antiplatelets: Secondary | ICD-10-CM | POA: Diagnosis not present

## 2021-01-27 DIAGNOSIS — Z7982 Long term (current) use of aspirin: Secondary | ICD-10-CM | POA: Diagnosis not present

## 2021-01-27 DIAGNOSIS — Z794 Long term (current) use of insulin: Secondary | ICD-10-CM | POA: Insufficient documentation

## 2021-01-27 DIAGNOSIS — E1122 Type 2 diabetes mellitus with diabetic chronic kidney disease: Secondary | ICD-10-CM | POA: Diagnosis not present

## 2021-01-27 DIAGNOSIS — I130 Hypertensive heart and chronic kidney disease with heart failure and stage 1 through stage 4 chronic kidney disease, or unspecified chronic kidney disease: Secondary | ICD-10-CM | POA: Diagnosis not present

## 2021-01-27 DIAGNOSIS — E1142 Type 2 diabetes mellitus with diabetic polyneuropathy: Secondary | ICD-10-CM | POA: Insufficient documentation

## 2021-01-27 DIAGNOSIS — N281 Cyst of kidney, acquired: Secondary | ICD-10-CM | POA: Diagnosis not present

## 2021-01-27 DIAGNOSIS — Z96653 Presence of artificial knee joint, bilateral: Secondary | ICD-10-CM | POA: Insufficient documentation

## 2021-01-27 DIAGNOSIS — M5136 Other intervertebral disc degeneration, lumbar region: Secondary | ICD-10-CM | POA: Diagnosis not present

## 2021-01-27 DIAGNOSIS — K5730 Diverticulosis of large intestine without perforation or abscess without bleeding: Secondary | ICD-10-CM | POA: Diagnosis not present

## 2021-01-27 LAB — CBC
HCT: 45 % (ref 36.0–46.0)
Hemoglobin: 14.8 g/dL (ref 12.0–15.0)
MCH: 30.8 pg (ref 26.0–34.0)
MCHC: 32.9 g/dL (ref 30.0–36.0)
MCV: 93.8 fL (ref 80.0–100.0)
Platelets: 286 10*3/uL (ref 150–400)
RBC: 4.8 MIL/uL (ref 3.87–5.11)
RDW: 14 % (ref 11.5–15.5)
WBC: 11.5 10*3/uL — ABNORMAL HIGH (ref 4.0–10.5)
nRBC: 0 % (ref 0.0–0.2)

## 2021-01-27 LAB — BASIC METABOLIC PANEL
Anion gap: 14 (ref 5–15)
BUN: 40 mg/dL — ABNORMAL HIGH (ref 8–23)
CO2: 29 mmol/L (ref 22–32)
Calcium: 9.1 mg/dL (ref 8.9–10.3)
Chloride: 95 mmol/L — ABNORMAL LOW (ref 98–111)
Creatinine, Ser: 1.48 mg/dL — ABNORMAL HIGH (ref 0.44–1.00)
GFR, Estimated: 37 mL/min — ABNORMAL LOW (ref >60)
Glucose, Bld: 238 mg/dL — ABNORMAL HIGH (ref 70–99)
Potassium: 3.3 mmol/L — ABNORMAL LOW (ref 3.5–5.1)
Sodium: 138 mmol/L (ref 135–145)

## 2021-01-27 LAB — URINALYSIS, COMPLETE (UACMP) WITH MICROSCOPIC
Bacteria, UA: NONE SEEN
Bilirubin Urine: NEGATIVE
Glucose, UA: >=500 mg/dL — AB
Hgb urine dipstick: NEGATIVE
Ketones, ur: NEGATIVE mg/dL
Leukocytes,Ua: NEGATIVE
Nitrite: NEGATIVE
Protein, ur: NEGATIVE mg/dL
Specific Gravity, Urine: 1.018 (ref 1.005–1.030)
pH: 5 (ref 5.0–8.0)

## 2021-01-27 LAB — NM MYOCAR MULTI W/SPECT W/WALL MOTION / EF
Estimated workload: 1 METS
Exercise duration (min): 0 min
Exercise duration (sec): 0 s
LV dias vol: 15 mL (ref 46–106)
LV sys vol: 2 mL
MPHR: 145 {beats}/min
Peak HR: 92 {beats}/min
Percent HR: 63 %
Rest HR: 72 {beats}/min
SDS: 0
SRS: 0
SSS: 0
TID: 1.67

## 2021-01-27 LAB — HEPATIC FUNCTION PANEL
ALT: 19 U/L (ref 0–44)
AST: 27 U/L (ref 15–41)
Albumin: 3.6 g/dL (ref 3.5–5.0)
Alkaline Phosphatase: 111 U/L (ref 38–126)
Bilirubin, Direct: <0.1 mg/dL (ref 0.0–0.2)
Total Bilirubin: 0.7 mg/dL (ref 0.3–1.2)
Total Protein: 7.5 g/dL (ref 6.5–8.1)

## 2021-01-27 NOTE — ED Provider Notes (Signed)
[facility] Emergency Department Provider Note  ____________________________________________   None    (approximate)  I have reviewed the triage vital signs and the nursing notes.   HISTORY  Chief Complaint Hematuria  HPI Denise Sharp is a 75 y.o. female below medical history including heart failure, anemia, and hypertension, presents to the ED for evaluation of gross hematuria.  Patient presents at the advice of her nephrologist, after she admits to 4 separate episodes of moderate blood on toilet tissue.  She notes some burning when she pees as well as some low abdominal pain.  Patient has a intrastem in her bladder.  Denies any nausea, vomiting, diarrhea.  She also denies any fever, chills, sweats.  Past Medical History:  Diagnosis Date   (HFpEF) heart failure with preserved ejection fraction ([place])    a. 12/2019 Echo: EF 65-70%, Gr2 DD. No significant valvular dzs.   Abnormality of gait 03/25/2013   Adrenal mass, left ([place])    Anemia    iron deficient post  2 unit txfsn 2009, normal endo/colonoscopy by Wohl   Arthritis    CAD (coronary artery disease) with h/o Atypical Chest Pain    a. 04/1986 Cath ([facility]): nl cors, EF 65%; b. 07/2012 Cath [facility]): Diff minor irregs; c. 07/2016 MV [facility]): "Equivocal"; d. 08/2016 Cardiac CT Ca2+ score [facility]): Ca2+ 8280; e. 05/2019 MV: No ischemia. EF 75%; f. 12/2019 PCI: LM nl, LAD 65ost (3.5x12 Resolute DES), 32m (2.75x12 Resolute DES),LCX/RCA nl; g. 08/2020 Cath: patent LAD stents, RCA 40ost, elev RH pressures, EF >65%.   Cervical spinal stenosis 1994   due to trauma to back ([employer] accident), has intermittent paralysis and parasthesias   Cervicogenic headache 03/23/2014   Depression    Dizziness    chronic dizziness   DJD (degenerative joint disease)    a. Chronic R shoulder pain pending R shoulder replacement 07/2017.   Esophageal stenosis March 2011   with transietn outlet obstruction by food, cleared by EGD    Family  history of adverse reaction to anesthesia    daughter PONV   Gastric bypass status for obesity    Headache(784.0)    Hypertension    IBS (irritable bowel syndrome)    Left bundle branch block (LBBB)    a. Intermittently present - likely rate related. - patient denies   Obesity    Obstructive sleep apnea    using CPAP   Polyneuropathy in diabetes(357.2) 03/25/2013   Restless leg syndrome    Rotator cuff arthropathy, right 08/13/2017   Syncope and collapse 03/12/2014   Type II diabetes mellitus [facility])     Patient Active Problem List   Diagnosis Date Noted   PSVT (paroxysmal supraventricular tachycardia) ([place]) 12/15/2020   CKD stage 3 due to type 2 diabetes mellitus ([place]) 12/09/2020   Temporal arteritis syndrome ([place]) 11/17/2020   Other pulmonary embolism with acute cor pulmonale, unspecified chronicity ([place]) 11/17/2020   Mild episode of recurrent major depressive disorder ([place]) 11/17/2020   Hyperlipidemia associated with type 2 diabetes mellitus ([place]) 08/21/2020   Iron deficiency 06/28/2020   Skin lesion of chest wall 06/10/2020   Elevated alkaline phosphatase level 06/10/2020   Coagulation defect ([place]) 06/06/2020   Chronic heart failure with preserved ejection fraction (HFpEF) ([place]) 01/14/2020   Hyperlipidemia LDL goal <70 01/14/2020   Coronary artery disease involving native coronary artery of native heart without angina pectoris 01/13/2020   S/P cardiac catheterization 01/13/2020   Accelerating angina ([place]) 12/31/2019   Dysphagia 11/30/2019  Bladder pain 08/23/2019   Gastritis 08/07/2019   Lab test negative for COVID-19 virus 08/07/2019   S/P panniculectomy 06/16/2019   Major depressive disorder with current active episode 05/30/2019   Exposure to COVID-19 virus 12/25/2018   Functional diarrhea 10/22/2018   Bilateral cataracts 06/26/2018   Status post bariatric surgery 02/05/2018   GAD (generalized anxiety disorder) 02/01/2018   Rectocele 01/24/2018   Vaginal prolapse  01/04/2018   Hepatic steatosis 01/04/2018   Microalbuminuria due to type 2 diabetes mellitus ([name]) 11/21/2017   Hospital discharge follow-up 09/22/2017   Hypocalcemia 09/22/2017   Hypotension 09/22/2017   Fecal incontinence 09/22/2017   B12 deficiency 09/22/2017   Transient disorientation 08/20/2017   Rotator cuff arthropathy, right 08/13/2017   Primary localized osteoarthrosis of shoulder 08/13/2017   Encounter for preoperative examination for general surgical procedure 08/03/2017   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 ([place]) 08/03/2017   Candidiasis, intertrigo 08/03/2017   Venous stasis dermatitis of right lower extremity 11/21/2016   Hypersomnia, persistent 06/18/2015   Mechanical breakdown of implanted electronic neurostimulator of peripheral nerve ([place]) 10/16/2014   Obesity hypoventilation syndrome ([place]) 08/13/2014   Frequent falls 06/23/2014   Chronic cough 05/05/2014   Adenoma of left adrenal gland 03/24/2014   New onset of headaches after age 38 03/23/2014   Vitamin D deficiency 01/06/2014   Weight gain following gastric bypass surgery 12/03/2013   Morbid obesity ([place]) 08/25/2013   Diaphragmatic hernia 04/06/2013   Diabetic polyneuropathy ([place]) 03/25/2013   Abnormality of gait 03/25/2013   Multiple pulmonary nodules 03/20/2013   DOE (dyspnea on exertion) 03/20/2013   Iron deficiency anemia 08/06/2012   Functional disorder of bladder 07/15/2012   Incomplete bladder emptying 07/15/2012   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[place]) 07/15/2012   Urge incontinence 07/15/2012   Increased frequency of urination 07/15/2012   OSA on CPAP 03/02/2012   Insomnia secondary to anxiety 02/29/2012   Sciatica 09/05/2011   Cervical stenosis of spinal canal 06/14/2011   Restless legs syndrome 03/13/2011   Essential hypertension 03/12/2011   Type 2 diabetes mellitus with diabetic neuropathy, unspecified ([place]) 03/12/2011   Hearing loss, right 03/12/2011    Past Surgical  History:  Procedure Laterality Date   ABDOMINAL HYSTERECTOMY     APPENDECTOMY     CARDIAC CATHETERIZATION     COLONOSCOPY     CORONARY STENT INTERVENTION N/A 01/04/2020   Procedure: CORONARY STENT INTERVENTION;  Surgeon: [name], MD;  Location: [place] [facility];  Service: Cardiovascular;  Laterality: N/A;  LAD    DIAPHRAGMATIC HERNIA REPAIR  2015   ESOPHAGEAL DILATION     multiple   ESOPHAGOGASTRODUODENOSCOPY     GALLBLADDER SURGERY  1976   GALLBLADDER SURGERY  resection   GASTRIC BYPASS     GASTRIC BYPASS  2000, 2005   Dr. [name] IMPLANT PLACEMENT  April 2013   [place] REPLACEMENT  2007   bilateral knee. [name],  [name]   LEFT HEART CATH AND CORS/GRAFTS ANGIOGRAPHY N/A 12/31/2019   Procedure: LEFT HEART CATH AND CORS/GRAFTS ANGIOGRAPHY;  Surgeon: [name], MD;  Location: [place] [facility];  Service: Cardiovascular;  Laterality: N/A;   PANNICULECTOMY  06/16/2019   PANNICULECTOMY N/A 06/16/2019   Procedure: PANNICULECTOMY;  Surgeon: [name], MD;  Location: [place];  Service: Plastics;  Laterality: N/A;  3 hours, please   REVERSE SHOULDER ARTHROPLASTY Right 08/13/2017   Procedure: REVERSE RIGHT SHOULDER ARTHROPLASTY;  Surgeon: [name], MD;  Location: [place];  Service: Orthopedics;  Laterality: Right;   RIGHT/LEFT HEART CATH AND CORONARY ANGIOGRAPHY N/A 09/06/2020   Procedure: RIGHT/LEFT HEART CATH AND CORONARY ANGIOGRAPHY;  Surgeon: [name], MD;  Location: [place] [facility];  Service: Cardiovascular;  Laterality: N/A;   ROTATOR CUFF REPAIR     right   SPINE SURGERY  1995   [place]   TOTAL ABDOMINAL HYSTERECTOMY W/ BILATERAL SALPINGOOPHORECTOMY  9381   UMBILICAL HERNIA REPAIR  Aug 11, 2015    Prior to Admission medications   Medication Sig Start Date End Date Taking? Authorizing Provider  albuterol (VENTOLIN HFA) 108 (90 Base) MCG/ACT inhaler Inhale 2 puffs into the lungs every 8 (eight) hours as needed for wheezing  or shortness of breath. Patient not taking: No sig reported 08/07/19   [name], MD  aspirin EC 81 MG tablet Take 1 tablet (81 mg total) by mouth daily. 01/05/20   Fritzi Mandes, MD  atorvastatin (LIPITOR) 40 MG tablet Take 1 tablet (40 mg total) by mouth daily. 11/29/20   [name], MD  clopidogrel (PLAVIX) 75 MG tablet Take 1 tablet (75 mg total) by mouth daily. 03/07/20   Dunn, Areta Haber, PA-C  clotrimazole-betamethasone (LOTRISONE) cream Apply 1 application topically 2 (two) times daily as needed.    [provider]  Continuous Blood Gluc Receiver (FREESTYLE LIBRE 2 READER) DEVI Use to check sugar at least TID 09/26/20   [name], MD  cyanocobalamin (,VITAMIN B-12,) 1000 MCG/ML injection Inject 1 mL (1,000 mcg total) into the muscle every 30 (thirty) days. Inject 1 ml (1000 mcg ) IM weekly x 4,  Then monthly thereafter 01/09/21   [name], MD  Dapagliflozin Propanediol (FARXIGA PO) Take 10 mg by mouth daily.    [provider]  diphenhydrAMINE (BENADRYL) 25 MG tablet Take 50 mg by mouth daily as needed for allergies.    [provider]  diphenoxylate-atropine (LOMOTIL) 2.5-0.025 MG tablet Take 1 tablet by mouth 4 (four) times daily as needed for diarrhea or loose stools. 10/20/20   [name], MD  fexofenadine (ALLEGRA) 180 MG tablet Take 180 mg by mouth daily.    [provider]  hyoscyamine (ANASPAZ) 0.125 MG TBDP disintergrating tablet DISSOLVE UNDER TONGUE 15 MINUTES BEFORE MEALS 04/21/20   [name], MD  insulin aspart (NOVOLOG FLEXPEN) 100 UNIT/ML FlexPen Inject 8-16 units up to 3 times daily before meals 01/19/21   [name], MD  insulin glargine (LANTUS SOLOSTAR) 100 UNIT/ML Solostar Pen Inject 15 Units into the skin daily. 10/26/20   [name], MD  Insulin Pen Needle (DROPLET PEN NEEDLES) 31G X 5 MM MISC USE ONE NEEDLE SUBCUTANEOUSLY AS DIRECTED (REMOVE AND DISCARD NEEDLE IN SHARPS CONTAINER IMMEDIATELY AFTER USE)  06/13/20   [name], MD  loperamide (IMODIUM) 2 MG capsule Take by mouth as needed.    [provider]  metoprolol tartrate (LOPRESSOR) 25 MG tablet Take 0.5 tablets (12.5 mg total) by mouth 2 (two) times daily. 11/23/20 02/21/21  Theora Gianotti, NP  nitroGLYCERIN (NITROSTAT) 0.4 MG SL tablet Place 1 tablet (0.4 mg total) under the tongue every 5 (five) minutes as needed for chest pain. 09/23/20   Theora Gianotti, NP  NONFORMULARY OR COMPOUNDED Pax  Combination Pain Cream -  Baclofen 2%, Doxepin 5%, Gabapentin 6%, Topiramate 2%, Pentoxifylline 3% Apply 1-2 grams to affected area 3-4 times daily Qty. 120 gm 3 refills 10/14/17   Gardiner Barefoot, DPM  nystatin (  MYCOSTATIN/NYSTOP) powder Apply 1 g topically 4 (four) times daily as needed (rash).    [provider]  potassium chloride SA (KLOR-CON) 20 MEQ tablet Take 1 tablet (20 mEq total) by mouth daily. 08/19/20   End, Harrell Gave, MD  pregabalin (LYRICA) 50 MG capsule TAKE 1 CAPSULE BY MOUTH 2 TIMES DAILY. 01/02/21   Kathrynn Ducking, MD  Semaglutide,0.25 or 0.5MG /DOS, (OZEMPIC, 0.25 OR 0.5 MG/DOSE,) 2 MG/1.5ML SOPN Inject 0.5 mg into the skin once a week. 01/19/21   [name], MD  torsemide (DEMADEX) 10 MG tablet Take 1 tablet (10 mg total) by mouth daily as needed (for weight gain greater than 2 lbs in 1 day or 5 lbs in 1 week or worsening leg swelling). 01/23/21   Furth, Cadence H, PA-C  traMADol (ULTRAM) 50 MG tablet Take 0.5 tablets (25 mg total) by mouth every 12 (twelve) hours as needed for severe pain. 08/21/17   Regalado, Belkys A, MD  umeclidinium-vilanterol (ANORO ELLIPTA) 62.5-25 MCG/INH AEPB Inhale 1 puff into the lungs daily. Patient not taking: Reported on 01/20/2021 05/24/20   [name], MD    Allergies Demeclocycline, Erythromycin, Flagyl [metronidazole], Glucophage [metformin hcl], Tetracyclines & related, Diovan [valsartan], Sulfa antibiotics, and Xanax  [alprazolam]  Family History  Problem Relation Age of Onset   Heart disease Father    Hypertension Father    Prostate cancer Father    Stroke Father    Osteoporosis Father    Stroke Mother    Depression Mother    Headache Mother    Heart disease Mother    Thyroid disease Mother    Hypertension Mother    Diabetes Daughter    Heart disease Daughter    Hypertension Daughter    Hypertension Son     Social History Social History   Tobacco Use   Smoking status: Never   Smokeless tobacco: Never  Vaping Use   Vaping Use: Never used  Substance Use Topics   Alcohol use: No   Drug use: Yes    Comment: prescribed valium    Review of Systems  Constitutional: No fever/chills Eyes: No visual changes. ENT: No sore throat. Cardiovascular: Denies chest pain. Respiratory: Denies shortness of breath. Gastrointestinal: No abdominal pain.  No nausea, no vomiting.  No diarrhea.  No constipation. Genitourinary: Negative for dysuria. Reports gross hematuria Musculoskeletal: Negative for back pain. Skin: Negative for rash. Neurological: Negative for headaches, focal weakness or numbness.  ____________________________________________   PHYSICAL EXAM:  VITAL SIGNS: ED Triage Vitals  Enc Vitals Group     BP 01/27/21 1051 119/65     Pulse Rate 01/27/21 1051 85     Resp 01/27/21 1051 18     Temp 01/27/21 1051 98.6 F (37 C)     Temp Source 01/27/21 1051 Oral     SpO2 01/27/21 1051 97 %     Weight 01/27/21 1059 246 lb 3.2 oz (111.7 kg)     Height 01/27/21 1059 5\' 4"  (1.626 m)     Head Circumference --      Peak Flow --      Pain Score 01/27/21 1059 4     Pain Loc --      Pain Edu? --      Excl. in [place]? --    Constitutional: Alert and oriented. Well appearing and in no acute distress. Eyes: Conjunctivae are normal. PERRL. EOMI. Head: Atraumatic. Neck: No stridor.   Cardiovascular: Normal rate, regular rhythm. Grossly normal heart sounds.  Good  peripheral  circulation. Respiratory: Normal respiratory effort.  No retractions. Lungs CTAB. Gastrointestinal: Soft and nontender. No distention. No abdominal bruits. No CVA tenderness.  Negative stool guaiac on exam.  No external rectal lesions or hemorrhoids noted. Genitourinary: normal external genitalia Musculoskeletal: No lower extremity tenderness nor edema.  No joint effusions. Neurologic:  Normal speech and language. No gross focal neurologic deficits are appreciated. No gait instability. Skin:  Skin is warm, dry and intact. No rash noted. Psychiatric: Mood and affect are normal. Speech and behavior are normal.  ____________________________________________   LABS (all labs ordered are listed, but only abnormal results are displayed)  Labs Reviewed  URINALYSIS, COMPLETE (UACMP) WITH MICROSCOPIC - Abnormal; Notable for the following components:      Result Value   Color, Urine YELLOW (*)    APPearance HAZY (*)    Glucose, UA >=500 (*)    All other components within normal limits  CBC - Abnormal; Notable for the following components:   WBC 11.5 (*)    All other components within normal limits  BASIC METABOLIC PANEL - Abnormal; Notable for the following components:   Potassium 3.3 (*)    Chloride 95 (*)    Glucose, Bld 238 (*)    BUN 40 (*)    Creatinine, Ser 1.48 (*)    GFR, Estimated 37 (*)    All other components within normal limits  HEPATIC FUNCTION PANEL   ____________________________________________  EKG  ____________________________________________  RADIOLOGY I, Melvenia Needles, personally viewed and evaluated these images (plain radiographs) as part of my medical decision making, as well as reviewing the written report by the radiologist.  ED MD interpretation:  agree with report  Official radiology report(s): CT ABDOMEN PELVIS WO CONTRAST  Result Date: 01/27/2021 CLINICAL DATA:  Hematuria. EXAM: CT ABDOMEN AND PELVIS WITHOUT CONTRAST TECHNIQUE: Multidetector  CT imaging of the abdomen and pelvis was performed following the standard protocol without IV contrast. COMPARISON:  03/24/2016 FINDINGS: Lower chest: Mitral annular calcification noted. Hepatobiliary: The liver shows diffusely decreased attenuation suggesting fat deposition. No focal abnormality in the liver on this study without intravenous contrast. Gallbladder surgically absent. No intrahepatic or extrahepatic biliary dilation. Pancreas: No focal mass lesion. No dilatation of the main duct. No intraparenchymal cyst. No peripancreatic edema. Spleen: No splenomegaly. No focal mass lesion. Adrenals/Urinary Tract: Right adrenal gland unremarkable. 16 mm left adrenal nodule is stable since prior consistent with benign etiology, likely lipid poor adenoma. No calcified stone disease in either kidney or ureter. Central sinus cysts again noted both kidneys, better demonstrated on the previous study performed with intravenous contrast material. No secondary changes in either kidney or ureter. The urinary bladder appears normal for the degree of distention. Stomach/Bowel: Status post gastric bypass. Roux limb is nondilated. No small bowel wall thickening. No small bowel dilatation. The terminal ileum is normal. The appendix is not well visualized, but there is no edema or inflammation in the region of the cecum. No gross colonic mass. No colonic wall thickening. Diverticular changes are noted in the left colon without evidence of diverticulitis. Vascular/Lymphatic: There is mild atherosclerotic calcification of the abdominal aorta without aneurysm. There is no gastrohepatic or hepatoduodenal ligament lymphadenopathy. No retroperitoneal or mesenteric lymphadenopathy. No pelvic sidewall lymphadenopathy. Reproductive: Uterus surgically absent.  There is no adnexal mass. Other: No intraperitoneal free fluid. Musculoskeletal: Right sacral stimulator device evident. No worrisome lytic or sclerotic osseous abnormality.  Degenerative disc disease noted lumbar spine. IMPRESSION: 1. No acute findings in the abdomen or  pelvis. Specifically, no findings to explain the patient's history of hematuria. No urinary stone disease. No secondary changes in either kidney or ureter. 2. Hepatic steatosis. 3. 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 4. Left colonic diverticulosis without diverticulitis. 5. Aortic Atherosclerosis (ICD10-I70.0). Electronically Signed   By: Misty Stanley M.D.   On: 01/27/2021 15:41    ____________________________________________   PROCEDURES  Procedure(s) performed (including Critical Care):  Procedures   ____________________________________________   INITIAL IMPRESSION / ASSESSMENT AND PLAN / ED COURSE  As part of my medical decision making, I reviewed the following data within the [place] reviewed as above, Radiograph reviewed NAD, Evaluated by EM attending K. Paduchowski, and Notes from prior ED visits    Differential diagnosis includes, but is not limited to, acute appendicitis, diverticulitis, urinary tract infection/pyelonephritis, endometriosis, bowel obstruction, colitis, renal colic, gastroenteritis, hernia, etc.  ED evaluation of intermittent gross hematuria on presentation to the ED.  Patient found to be hemologically stable on presentation.  She is without any acute pain or gross hematuria at this time.  Labs are reassuring as it shows no acute anemia, and CT imaging does not reveal any acute intra-abdominal process to explain the patient's current symptoms.  Patient will be discharged at this time to follow-up with her nephrologist or seek further evaluation with urology.  Return precautions have been reviewed. ____________________________________________   FINAL CLINICAL IMPRESSION(S) / ED DIAGNOSES  Final diagnoses:  Hematuria, unspecified type     ED Discharge Orders     None         Note:  This document was prepared using Dragon voice recognition software and may include unintentional dictation errors.     Melvenia Needles, PA-C 01/27/21 1712    Vladimir Crofts, MD 01/28/21 [phone]

## 2021-01-27 NOTE — ED Provider Notes (Signed)
-----------------------------------------   5:01 PM on 01/27/2021 ----------------------------------------- Patient has been seen and evaluated in the emergency department with complaint of hematuria.  Patient's work-up is reassuring today.  No signs of blood within the urine.  Lab work is largely at baseline besides mild dehydration.  Patient appears extremely well, no complaints at this time.  Is eating in bed.  Patient CT scan is nonrevealing.  Did discuss urology follow-up for further evaluation.  Patient also has hemorrhoids which could have intermittent hemorrhoidal bleeding as the source however patient is not sure and denies any active bleeding currently.  Given the reassuring lab work CT scan and emergency department work-up I do believe the patient is safe for PCP follow-up.   Harvest Dark, MD 01/27/21 [phone]

## 2021-01-27 NOTE — ED Triage Notes (Signed)
Pt states that she started having hematuria on Wednesday- pt states she has had 4 voids with a moderate amount of blood in the toilet- pt states that she was sent for a CT scan of her bladder and blood work from her nephrologist- pt states that she has burning when she pees and lower, stabbing abdominal pain- pt states she has an intrastem in her bladder

## 2021-01-27 NOTE — Telephone Encounter (Signed)
Called pt to follow up with her. Pt stated that she did not go to the ED last night but she is getting ready to go this morning. Pt stated that she is not currently bleeding however now she is unable to urinate. Pt was advised that she needs to be evaluated and stated that she is going this morning.

## 2021-01-27 NOTE — Telephone Encounter (Signed)
Noted, pt currently on her way to the ED for rectal and urinary bleeding. Refer to phone encounter from yesterday, pt spoke with RN regarding fluid retention and what meds to take  F/u appt 7/18 with Cadence if not admitted into hospital

## 2021-01-27 NOTE — Telephone Encounter (Signed)
Pt c/o swelling: STAT is pt has developed SOB within 24 hours  If swelling, where is the swelling located? none  How much weight have you gained and in what time span? At least 3 lbs over night  Have you gained 3 pounds in a day or 5 pounds in a week? 3 lbs over night  Do you have a log of your daily weights (if so, list)?   Are you currently taking a fluid pill? Needing advice, has not taken since 7/13  Are you currently SOB? yes  Have you traveled recently?   Patient is actively going to ED now due to active rectal bleeding last night and blood in the urine, patient unable to urinate this morning. Bleeding started on 7/13 at night. Pain in bladder area like she was hit.  Pt is going to have a CT scan of the bladder Patient had stress test on 7/14 at [facility]

## 2021-01-27 NOTE — Telephone Encounter (Signed)
See other telephone encounter.

## 2021-01-27 NOTE — Progress Notes (Signed)
Cardiology Office Note:    Date:  01/27/2021   ID:  Denise Sharp, DOB February 13, 1946, MRN [id number]  PCP:  [name], MD  [facility] Cardiologist:  [name], MD  [facility] Electrophysiologist:  None   Referring MD: [name], MD   Chief Complaint: Stress test follow-up  History of Present Illness:    Denise Sharp is a 75 y.o. female with a hx of CAD status post DES x2 to mid/ostial LAD 01/03/2020, tobacco use, hypertension, type 2 diabetes, iron deficiency anemia following gastric bypass x2, IBS, esophageal stenosis, chronic dizziness with gait instability, cervicogenic headache with cervical spine stenosis, OSA on CPAP, restless leg syndrome, adrenal mass.  Patient underwent echo 2013 for dizziness and dyspnea that showed EF 55 to 38%, normal diastolic dysfunction.  She has difficult to control hypertension on multiple agents.  In 2014 she underwent renal artery angiography which was normal and coronary angiography which was also normal with minimal luminal irregularities.  01/03/2020 she had worsening chest pain for 2 weeks which progressed to unstable angina.  She was taken for left heart cath which showed 65% ostial LAD disease, 80% mid LAD disease, LVEDP only mildly elevated, LV gram with normal EF.  She underwent PCI with DES x2 to mid/ostial LAD.  Echo showed EF 65 to 10%, grade 2 diastolic dysfunction, normal RV, no significant valvular abnormality.  She was seen back on 09/04/2020 despite intervention she still felt fairly poorly with shortness of breath.  She was started on Lasix with little improvement.  She was set up for right and left heart cath which showed nonobstructive coronary disease with 40% ostial RCA stenosis, widely patent LAD stents.  Right heart cath showed RA 17 mmHg, PCWP 22 mmHg.  She was switched to torsemide at one-point and metolazone was added.  She saw Georjean Mode 11/02/2020 for palpitations and nausea.  Heart monitor was placed  showing 11 beats NSVT longest run 11 beats, 14 SVT episodes longest was 18 beats.  She was started on metoprolol.  Patient developed AKI thought to be secondary to overdiuresis and torsemide was decreased.  She was for to nephrology and advanced heart failure clinic.  Patient's advanced heart failure clinic 01/11/2021.  She reported low blood pressures however they seem to be normotensive on her log.  She was taking torsemide 20 mg daily.  Weight trend 245 to 250 pounds.  It was felt most of her symptoms were due to her longstanding obesity and OHS, not much else from a heart failure standpoint to add on.  Seen in clinic 01/20/2021 and reported persistent chest pain.  A Myoview Lexiscan was ordered. Torsemide dose of 20mg  daily was continued.  Today, the patient reports her weights have been up. She has been taking Torsemide 10mg  daily but feels this doesn't control her weight well. Weight up 4lbs by her home log. She took an extra torsemide 3 times last week to keep her weight down. She reports worsening sob when weight is up. In chart review appears she was supposed to be taking Torsemide 20mg  daily. No LLE, orthopnea. Also following with nephrology who reported higher torsemide doses is ok. She denies chest pain. Does reports some palpitaitons in the AM. Also has PVCs on EKG. Myoview stress test was reviewed, EKG portion showed PACs/PVCs. Ok to stop plavix since we are 1 year out stenting. Also requesting we call to make a urology appointment.    Past Medical History:  Diagnosis Date   (  HFpEF) heart failure with preserved ejection fraction ([place])    a. 12/2019 Echo: EF 65-70%, Gr2 DD. No significant valvular dzs.   Abnormality of gait 03/25/2013   Adrenal mass, left ([place])    Anemia    iron deficient post  2 unit txfsn 2009, normal endo/colonoscopy by [facility]   Arthritis    CAD (coronary artery disease) with h/o Atypical Chest Pain    a. 04/1986 Cath ([facility]): nl cors, EF 65%; b. 07/2012 Cath [facility]): Diff  minor irregs; c. 07/2016 MV [facility]): "Equivocal"; d. 08/2016 Cardiac CT Ca2+ score [facility]): Ca2+ 9381; e. 05/2019 MV: No ischemia. EF 75%; f. 12/2019 PCI: LM nl, LAD 65ost (3.5x12 Resolute DES), 63m (2.75x12 Resolute DES),LCX/RCA nl; g. 08/2020 Cath: patent LAD stents, RCA 40ost, elev RH pressures, EF >65%.   Cervical spinal stenosis 1994   due to trauma to back ([employer] accident), has intermittent paralysis and parasthesias   Cervicogenic headache 03/23/2014   Depression    Dizziness    chronic dizziness   DJD (degenerative joint disease)    a. Chronic R shoulder pain pending R shoulder replacement 07/2017.   Esophageal stenosis March 2011   with transietn outlet obstruction by food, cleared by EGD    Family history of adverse reaction to anesthesia    daughter PONV   Gastric bypass status for obesity    Headache(784.0)    Hypertension    IBS (irritable bowel syndrome)    Left bundle branch block (LBBB)    a. Intermittently present - likely rate related. - patient denies   Obesity    Obstructive sleep apnea    using CPAP   Polyneuropathy in diabetes(357.2) 03/25/2013   Restless leg syndrome    Rotator cuff arthropathy, right 08/13/2017   Syncope and collapse 03/12/2014   Type II diabetes mellitus ([place])     Past Surgical History:  Procedure Laterality Date   ABDOMINAL HYSTERECTOMY     APPENDECTOMY     CARDIAC CATHETERIZATION     COLONOSCOPY     CORONARY STENT INTERVENTION N/A 01/04/2020   Procedure: CORONARY STENT INTERVENTION;  Surgeon: [name], MD;  Location: [place] [facility];  Service: Cardiovascular;  Laterality: N/A;  LAD    DIAPHRAGMATIC HERNIA REPAIR  2015   ESOPHAGEAL DILATION     multiple   ESOPHAGOGASTRODUODENOSCOPY     GALLBLADDER SURGERY  1976   GALLBLADDER SURGERY  resection   GASTRIC BYPASS     GASTRIC BYPASS  2000, 2005   Dr. [name] IMPLANT PLACEMENT  April 2013   [place] REPLACEMENT  2007   bilateral knee. [name],  [name]   LEFT  HEART CATH AND CORS/GRAFTS ANGIOGRAPHY N/A 12/31/2019   Procedure: LEFT HEART CATH AND CORS/GRAFTS ANGIOGRAPHY;  Surgeon: [name], MD;  Location: [place] [facility];  Service: Cardiovascular;  Laterality: N/A;   PANNICULECTOMY  06/16/2019   PANNICULECTOMY N/A 06/16/2019   Procedure: PANNICULECTOMY;  Surgeon: [name], MD;  Location: [place];  Service: Plastics;  Laterality: N/A;  3 hours, please   REVERSE SHOULDER ARTHROPLASTY Right 08/13/2017   Procedure: REVERSE RIGHT SHOULDER ARTHROPLASTY;  Surgeon: [name], MD;  Location: [place];  Service: Orthopedics;  Laterality: Right;   RIGHT/LEFT HEART CATH AND CORONARY ANGIOGRAPHY N/A 09/06/2020   Procedure: RIGHT/LEFT HEART CATH AND CORONARY ANGIOGRAPHY;  Surgeon: [name], MD;  Location: [place] [facility];  Service: Cardiovascular;  Laterality: N/A;   ROTATOR CUFF REPAIR  right   [place]   TOTAL ABDOMINAL HYSTERECTOMY W/ BILATERAL SALPINGOOPHORECTOMY  5732   UMBILICAL HERNIA REPAIR  Aug 11, 2015    Current Medications: No outpatient medications have been marked as taking for the 01/30/21 encounter (Appointment) with Kathlen Mody, Earnie Bechard H, PA-C.     Allergies:   Demeclocycline, Erythromycin, Flagyl [metronidazole], Glucophage [metformin hcl], Tetracyclines & related, Diovan [valsartan], Sulfa antibiotics, and Xanax [alprazolam]   Social History   Socioeconomic History   Marital status: Married    Spouse name: [name]    Number of children: 2   Years of education: College    Highest education level: Not on file  Occupational History    Employer: retired  Tobacco Use   Smoking status: Never   Smokeless tobacco: Never  Vaping Use   Vaping Use: Never used  Substance and Sexual Activity   Alcohol use: No   Drug use: Yes    Comment: prescribed valium   Sexual activity: Not Currently    Birth control/protection: Post-menopausal, Surgical  Other Topics Concern   Not on file  Social History  Narrative   Patient lives at home with husband [name].    Right Handed   Drinks caffeinated tea occasionally   Social Determinants of Health   Financial Resource Strain: Low Risk    Difficulty of Paying Living Expenses: Not hard at all  Food Insecurity: No Food Insecurity   Worried About Charity fundraiser in the Last Year: Never true   Arboriculturist in the Last Year: Never true  Transportation Needs: No Transportation Needs   Lack of Transportation (Medical): No   Lack of Transportation (Non-Medical): No  Physical Activity: Insufficiently Active   Days of Exercise per Week: 2 days   Minutes of Exercise per Session: 40 min  Stress: Stress Concern Present   Feeling of Stress : Rather much  Social Connections: Not on file     Family History: The patient's family history includes Depression in her mother; Diabetes in her daughter; Headache in her mother; Heart disease in her daughter, father, and mother; Hypertension in her daughter, father, mother, and son; Osteoporosis in her father; Prostate cancer in her father; Stroke in her father and mother; Thyroid disease in her mother.  ROS:   Please see the history of present illness.     All other systems reviewed and are negative.  EKGs/Labs/Other Studies Reviewed:    The following studies were reviewed today:  Cardiac cath 09/06/2020 Conclusions: Non-obstructive coronary artery disease with 40% ostial RCA stenosis. Widely patient ostial/proximal and mid LAD stents, with the distal LAD tapering to a small vessel. Mildly elevated left heart and pulmonary artery pressures. Severely elevated right heart filling pressures. Low Fick cardiac output/index consistent with HFpEF.   Recommendations: Continue gentle diuresis. Encourage weight loss and exercise. Consider outpatient referral to advanced heart failure clinic for further evaluation/management of chronic HFpEF. Continue secondary prevention of coronary artery disease.    [name], MD [facility] [place] [facility]   Cardiac cath 01/04/2020   Ost LAD to Prox LAD lesion is 80% stenosed. Post intervention, there is a 0% residual stenosis. Post intervention, there is a 0% residual stenosis. A drug-eluting stent was successfully placed using a STENT RESOLUTE ONYX 3.5X12. Mid LAD lesion is 80% stenosed. A drug-eluting stent was successfully placed using a Marlow [id number].   Successful PCI and drug-eluting stent placement to mid as well as ostial LAD.   Recommendations: Dual  antiplatelet therapy for at least 6 months but should consider long-term dual antiplatelet therapy given that the ostial LAD stent extends a millimeter into the left main coronary artery Aggressive treatment of risk factors. Likely discharge home tomorrow if no issues.   Echo 12/30/2019 1. Left ventricular ejection fraction, by estimation, is 65 to 70%. The  left ventricle has normal function. The left ventricle has no regional  wall motion abnormalities. Left ventricular diastolic parameters are  consistent with Grade II diastolic  dysfunction (pseudonormalization).   2. Right ventricular systolic function is normal. The right ventricular  size is normal.   3. The mitral valve is normal in structure. No evidence of mitral valve  regurgitation. No evidence of mitral stenosis.   4. The aortic valve is normal in structure. Aortic valve regurgitation is  not visualized. No aortic stenosis is present.   Heart monitor 11/15/2020   The patient was monitored for 4 days, 22 hours. The predominant rhythm was sinus with an average rate of 77 bpm (range 53-121 bpm and sinus). There were rare PACs and PVCs. 11 episodes of nonsustained ventricular tachycardia were observed, lasting up to 8 beats with a maximum rate of 210 bpm. 14 atrial runs lasting up to 18 beats with a maximum rate of 250 bpm occurred. Some episodes labeled as supraventricular tachycardia with aberrancy could represent  nonsustained ventricular tachycardia. Patient triggered events correspond to sinus rhythm and sinus rhythm with PVCs and artifact.   Predominantly sinus rhythm with rare PACs and PVCs.  Several episodes of nonsustained ventricular tachycardia and supraventricular tachycardia were observed.  EKG:  EKG is  ordered today.  The ekg ordered today demonstrates NSR, 94bpm PVCs, PAC, possible LAD, poor R wave progression  Recent Labs: 07/17/2020: ALT 12; TSH 3.695 09/28/2020: Magnesium 2.3 12/14/2020: B Natriuretic Peptide 83.4 12/28/2020: Hemoglobin 11.7; Platelets 297 01/20/2021: BUN 48; Creatinine, Ser 1.97; Potassium 3.5; Sodium 137  Recent Lipid Panel    Component Value Date/Time   CHOL 134 05/24/2020 1532   CHOL 155 02/27/2014 0420   TRIG 128.0 05/24/2020 1532   TRIG 121 02/27/2014 0420   HDL 60.40 05/24/2020 1532   HDL 43 02/27/2014 0420   CHOLHDL 2 05/24/2020 1532   VLDL 25.6 05/24/2020 1532   VLDL 24 02/27/2014 0420   LDLCALC 48 05/24/2020 1532   LDLCALC 88 02/27/2014 0420   LDLDIRECT 111.0 06/16/2015 0936    Physical Exam:    VS:  There were no vitals taken for this visit.    Wt Readings from Last 3 Encounters:  01/20/21 244 lb (110.7 kg)  01/11/21 249 lb 6.4 oz (113.1 kg)  01/06/21 253 lb 12.8 oz (115.1 kg)     GEN:  Well nourished, well developed in no acute distress HEENT: Normal NECK: No JVD; No carotid bruits LYMPHATICS: No lymphadenopathy CARDIAC: RRR, no murmurs, rubs, gallops RESPIRATORY:  Clear to auscultation without rales, wheezing or rhonchi  ABDOMEN: Soft, non-tender, non-distended MUSCULOSKELETAL:  No edema; No deformity  SKIN: Warm and dry NEUROLOGIC:  Alert and oriented x 3 PSYCHIATRIC:  Normal affect   ASSESSMENT:    1. Chest pain, unspecified type   2. Stage 3a chronic kidney disease ([place])   3. Morbid obesity ([place])   4. Coronary artery disease involving native coronary artery of native heart with unstable angina pectoris ([place])   5. Chronic  diastolic heart failure ([place])   6. PSVT (paroxysmal supraventricular tachycardia) ([facility])    PLAN:    In order of problems listed above:  CAD status post DES x2 mid/ostial LAD in 01/03/2020 Cath 08/2020 showed patent stents and 40% ostial RCA lesion. Recent Myoview showed no new ischemia, overall low risk scan. She denies further chest pain episodes. She has completed DAPT for 1 year, I will stop plavix. Continue Aspirin, BB, and statin.  HFpEF Patient reports weight gain on Torsemide 10mg  daily. Prior visit recommended she continue with the 20mg  torsemide, so unsure where the confusion was. She had to take Torsemide 20mg  mg a couple days last week to get weight back down. .Weight today is 3lbs up since the last visit. Feels breathing is a little worse when weight is up. Also following with nephrology who said higher torsemide doses is ok. Lungs clear, no JVD appreciated, no LLE. After a long discussion, plan for torsemide 20mg  M,W,F and 10mg  on the other days. BMET in a week. Continue with daily weights, no salt diet, leg elevation. Also, she sees nephrology later this week, further recommendations welcome.   pSVT/PVCs Reports palpitations in the mornings, also PVCs on EKG. I wil increase metoprolol to 25mg  BID. BP and heart rate good.   Morbid obesity/deconditioning Patient reports has been difficult to lose weight, she feels weight loss would not improve her symptoms anyway's.   Hypertension Increase metoprolol as above. She will monitor BP at home and inform us if it is too low.   CKD stage III BMET in a week with change in Torsemide. She sees nephrology later this week.   Disposition: Follow up in 3 month(s) with Dr. Rockey Situ   Signed, Brennen Camper Ninfa Meeker, PA-C  01/27/2021 9:52 AM    [place]

## 2021-01-27 NOTE — Discharge Instructions (Addendum)
Your exam, CT, and labs are normal and reassuring at this time.  Follow-up with urology for ongoing symptom management as discussed.  Return to the ED if needed.

## 2021-01-30 ENCOUNTER — Encounter: Payer: Self-pay | Admitting: Medical

## 2021-01-30 ENCOUNTER — Encounter: Payer: Self-pay | Admitting: Internal Medicine

## 2021-01-30 ENCOUNTER — Other Ambulatory Visit: Payer: Self-pay

## 2021-01-30 ENCOUNTER — Ambulatory Visit (INDEPENDENT_AMBULATORY_CARE_PROVIDER_SITE_OTHER): Payer: Medicare Other | Admitting: Medical

## 2021-01-30 VITALS — BP 110/70 | HR 94 | Ht 64.5 in | Wt 247.5 lb

## 2021-01-30 DIAGNOSIS — I25110 Atherosclerotic heart disease of native coronary artery with unstable angina pectoris: Secondary | ICD-10-CM | POA: Diagnosis not present

## 2021-01-30 DIAGNOSIS — R079 Chest pain, unspecified: Secondary | ICD-10-CM | POA: Diagnosis not present

## 2021-01-30 DIAGNOSIS — N1831 Chronic kidney disease, stage 3a: Secondary | ICD-10-CM

## 2021-01-30 DIAGNOSIS — I471 Supraventricular tachycardia: Secondary | ICD-10-CM | POA: Diagnosis not present

## 2021-01-30 DIAGNOSIS — I5032 Chronic diastolic (congestive) heart failure: Secondary | ICD-10-CM

## 2021-01-30 MED ORDER — TORSEMIDE 10 MG PO TABS: ORAL_TABLET | ORAL | 0 refills | Status: DC

## 2021-01-30 MED ORDER — METOPROLOL TARTRATE 25 MG PO TABS: 25.0000 mg | ORAL_TABLET | Freq: Two times a day (BID) | ORAL | 3 refills | Status: DC

## 2021-01-30 NOTE — Patient Instructions (Addendum)
Medication Instructions:  Your physician has recommended you make the following change in your medication:   INCREASE Metoprolol tartrate 25 mg twice a day STOP Plavix (clopidogrel) CHANGE Torsemide  Take 20 mg every Monday, Wednesday, and Friday Take 10 mg Tuesday, Thursday, Saturday, and Sunday.    *If you need a refill on your cardiac medications before your next appointment, please call your pharmacy*   Lab Work: BMET in one week over at the [employer] at [facility], no appointment is needed and just go to 1st desk on the right to check in, past the screening table.  Lab hours: Monday- Friday (7:30 am- 5:30 pm)  If you have labs (blood work) drawn today and your tests are completely normal, you will receive your results only by: [place] (if you have MyChart) OR A paper copy in the mail If you have any lab test that is abnormal or we need to change your treatment, we will call you to review the results.   Testing/Procedures: None   Follow-Up: At [facility], you and your health needs are our priority.  As part of our continuing mission to provide you with exceptional heart care, we have created designated Provider Care Teams.  These Care Teams include your primary Cardiologist (physician) and Advanced Practice Providers (APPs -  Physician Assistants and Nurse Practitioners) who all work together to provide you with the care you need, when you need it.   Your next appointment:   3 month(s)  The format for your next appointment:   In Person  Provider:   Ida Rogue, MD  Referral placed for Urology and someone should be in touch to get that scheduled.

## 2021-01-30 NOTE — Telephone Encounter (Signed)
Noted  

## 2021-01-31 DIAGNOSIS — M9902 Segmental and somatic dysfunction of thoracic region: Secondary | ICD-10-CM | POA: Diagnosis not present

## 2021-01-31 DIAGNOSIS — M9901 Segmental and somatic dysfunction of cervical region: Secondary | ICD-10-CM | POA: Diagnosis not present

## 2021-01-31 DIAGNOSIS — M5459 Other low back pain: Secondary | ICD-10-CM | POA: Diagnosis not present

## 2021-01-31 DIAGNOSIS — M9903 Segmental and somatic dysfunction of lumbar region: Secondary | ICD-10-CM | POA: Diagnosis not present

## 2021-02-01 ENCOUNTER — Telehealth: Payer: Self-pay

## 2021-02-01 DIAGNOSIS — M9901 Segmental and somatic dysfunction of cervical region: Secondary | ICD-10-CM | POA: Diagnosis not present

## 2021-02-01 DIAGNOSIS — M9902 Segmental and somatic dysfunction of thoracic region: Secondary | ICD-10-CM | POA: Diagnosis not present

## 2021-02-01 DIAGNOSIS — M5459 Other low back pain: Secondary | ICD-10-CM | POA: Diagnosis not present

## 2021-02-01 DIAGNOSIS — M9903 Segmental and somatic dysfunction of lumbar region: Secondary | ICD-10-CM | POA: Diagnosis not present

## 2021-02-01 NOTE — Telephone Encounter (Signed)
Patient informed, Due to the high volume of calls and your symptoms we have to forward your call to our Triage Nurse to expedient your call. Please hold for the transfer.  Patient transferred to [facility] at Access Nurse. Due to  severe back and right hip pain for a couple of days now. She went to the chiropractor and states that she needs pain meds because she is sore from that office visit with him. I let the pat coordinator know that we have no appts avail virtually or in office at the time of call.

## 2021-02-01 NOTE — Telephone Encounter (Signed)
Called and spoke with Denise Sharp and she states that she was very upset with access nurse and their treatment option. Denise Sharp states that she has chronic pain and it is treated by the nerve doctor. She states that she went to the chiropractor for her back issues and when she goes, she is in chronic pain and needs pain medicine to continue the treatments. Pt states that she went to the chiropractor yesterday and stayed up all night in pain and called in this morning around 3am to request medication to treat her pain. Pt states that the "Stupid Person" she spoke to at access nurse told her to contact 911 to treat her for her pain and right leg as she states that she could not put weight on it. Pt states that she could not put weight on that leg for years and that was not the issue and she declines going to urgent care or calling 911. Pt states that she was instructed by her chiropractor to contact her PCP for pain medication for the duration of her treatments and she wants to know if [name] can prescribe a narcotic for her pain at night. Please advise.

## 2021-02-01 NOTE — Telephone Encounter (Signed)
Let patient know that she cannot have 2 different physicians prescribing controlled substances.. she is getting tramadol from her pain management doctor, so she needs to contact that doctor for narcotics.

## 2021-02-01 NOTE — Telephone Encounter (Signed)
Access Nurse called to advise that the PT refuse to call 911 and stated they just wanted pain meds for their symptoms. Triage Nurse wanted to let us know and have [place] call PT.

## 2021-02-01 NOTE — Telephone Encounter (Signed)
LMTCB

## 2021-02-02 ENCOUNTER — Other Ambulatory Visit: Payer: Self-pay | Admitting: Internal Medicine

## 2021-02-02 ENCOUNTER — Telehealth: Payer: Self-pay | Admitting: Internal Medicine

## 2021-02-02 DIAGNOSIS — M9902 Segmental and somatic dysfunction of thoracic region: Secondary | ICD-10-CM | POA: Diagnosis not present

## 2021-02-02 DIAGNOSIS — M9901 Segmental and somatic dysfunction of cervical region: Secondary | ICD-10-CM | POA: Diagnosis not present

## 2021-02-02 DIAGNOSIS — M5459 Other low back pain: Secondary | ICD-10-CM | POA: Diagnosis not present

## 2021-02-02 DIAGNOSIS — M9903 Segmental and somatic dysfunction of lumbar region: Secondary | ICD-10-CM | POA: Diagnosis not present

## 2021-02-02 NOTE — Telephone Encounter (Signed)
Pt c/o swelling: STAT is pt has developed SOB within 24 hours  If swelling, where is the swelling located? Slight swelling in feet  How much weight have you gained and in what time span? 2.8 lbs overnight  Have you gained 3 pounds in a day or 5 pounds in a week?   Do you have a log of your daily weights (if so, list)?   Are you currently taking a fluid pill? Yes, wants to be advised on whether to take 10 mg or 20 mg. Patient was alternating but unsure if it is working due to the fluid increase   Are you currently SOB? no  Have you traveled recently?

## 2021-02-02 NOTE — Telephone Encounter (Signed)
Pt returned call to Jessica

## 2021-02-02 NOTE — Telephone Encounter (Signed)
Spoke to pt.  Wt gain 2.8 lbs overnight and minimal swelling in feet. Denies SOB.   Last ov with Cadence Kathlen Mody, NP 7/18.  At visit Torsemide dose changed to:  Take 20 mg every Monday, Wednesday, and Friday Take 10 mg Tuesday, Thursday, Saturday, and Sunday.   Pt denies missed doses of Torsemide.  Potassium also 3.3 on 7/15. Creat 1.38 / GFR 37.   Pt to have repeat BMET today (1 week post ov) after she sees Nephrologist this afternoon.   Pt denies incr sodium intake or any other changes r/t wt incr.  She has taken Torsemide 10mg  today per above instruction.  Notified pt will discuss with provider and will call back with recc.  Pt voiced understanding.

## 2021-02-02 NOTE — Telephone Encounter (Signed)
Spoke with pt and she stated that she does not see a pain management doctor. Pt stated that [name] prescribes her Tramadol. Pt was under the impression that Tramadol is a muscle relaxer so she has not tried taking it. She also stated that she has not taken anything to help with the pain because she wasn't sure if a narcotic was going to be called for the pain. Pt has a rx for tramadol.

## 2021-02-02 NOTE — Telephone Encounter (Signed)
Provided access nurse documentation

## 2021-02-02 NOTE — Telephone Encounter (Signed)
Patient having labs redrawn today to check K+ level. Waiting on results before refilling medication (per Darlyne Russian, RN).

## 2021-02-03 ENCOUNTER — Telehealth: Payer: Self-pay | Admitting: *Deleted

## 2021-02-03 ENCOUNTER — Other Ambulatory Visit
Admission: RE | Admit: 2021-02-03 | Discharge: 2021-02-03 | Disposition: A | Discharge status: Home / Self Care | Payer: Medicare Other | Attending: Medical | Admitting: Medical

## 2021-02-03 DIAGNOSIS — E1122 Type 2 diabetes mellitus with diabetic chronic kidney disease: Secondary | ICD-10-CM | POA: Diagnosis not present

## 2021-02-03 DIAGNOSIS — E611 Iron deficiency: Secondary | ICD-10-CM

## 2021-02-03 DIAGNOSIS — N183 Chronic kidney disease, stage 3 unspecified: Secondary | ICD-10-CM | POA: Diagnosis not present

## 2021-02-03 DIAGNOSIS — E876 Hypokalemia: Secondary | ICD-10-CM

## 2021-02-03 DIAGNOSIS — I5032 Chronic diastolic (congestive) heart failure: Secondary | ICD-10-CM | POA: Diagnosis not present

## 2021-02-03 DIAGNOSIS — Z79899 Other long term (current) drug therapy: Secondary | ICD-10-CM

## 2021-02-03 DIAGNOSIS — I2510 Atherosclerotic heart disease of native coronary artery without angina pectoris: Secondary | ICD-10-CM | POA: Insufficient documentation

## 2021-02-03 DIAGNOSIS — I25110 Atherosclerotic heart disease of native coronary artery with unstable angina pectoris: Secondary | ICD-10-CM

## 2021-02-03 DIAGNOSIS — N1831 Chronic kidney disease, stage 3a: Secondary | ICD-10-CM

## 2021-02-03 LAB — BASIC METABOLIC PANEL
Anion gap: 13 (ref 5–15)
BUN: 47 mg/dL — ABNORMAL HIGH (ref 8–23)
CO2: 32 mmol/L (ref 22–32)
Calcium: 8.8 mg/dL — ABNORMAL LOW (ref 8.9–10.3)
Chloride: 91 mmol/L — ABNORMAL LOW (ref 98–111)
Creatinine, Ser: 1.8 mg/dL — ABNORMAL HIGH (ref 0.44–1.00)
GFR, Estimated: 29 mL/min — ABNORMAL LOW (ref >60)
Glucose, Bld: 184 mg/dL — ABNORMAL HIGH (ref 70–99)
Potassium: 3.1 mmol/L — ABNORMAL LOW (ref 3.5–5.1)
Sodium: 136 mmol/L (ref 135–145)

## 2021-02-03 MED ORDER — TORSEMIDE 10 MG PO TABS
10.0000 mg | ORAL_TABLET | Freq: Every day | ORAL | Status: DC
Start: 2021-02-03 — End: 2021-03-21

## 2021-02-03 NOTE — Telephone Encounter (Signed)
Spoke with pt. She was unable to have labs yesterday.  Pt is en [facility] now [facility]. Provider will result note from labs today to verify Potassium dosage.  Otherwise, notified pt per Dr. Saunders Revel she may take Torsemide 20mg  for wt gain 3lbs overnight and/or 5lbs in one week until she returns to baseline weight moving forward.  Pt voiced understanding.

## 2021-02-03 NOTE — Telephone Encounter (Signed)
-----   Message from [place], PA-C sent at 02/03/2021  3:01 PM EDT ----- Labs showed some dehydration on the higher doses of torsemide. I recommend torsemide 10mg  daily. If the nephrologist want to prescribe the higher dose torsemide than they can do that. Re-check BMET in a week.

## 2021-02-03 NOTE — Telephone Encounter (Signed)
Spoke to pt, notified of lab results and provider's recc.  Also notified of Cadence Furth, [place] recc via secure chat regarding Potassium 3.1:   "Let's have her take potassium 54mEq for 2 days"  Pt will take TWO tablets (Potassium 41mEq) for 70mEq daily x 2 days Then will resume Potassium 49mEq DAILY thereafter Will DECREASE Torsemide to 10mg  DAILY Will have repeat [facility].  Pt confirms she has enough Potassium and Torsemide on hand.  Med list updated. Lab orders placed.  Of note, pt states she missed her nephrology appt yesterday 7/21 and it was r/s to September.   Pt with no further questions/concerns at this time.

## 2021-02-03 NOTE — Telephone Encounter (Signed)
Please advise if OK to refill same dose. Thank you!

## 2021-02-06 ENCOUNTER — Telehealth: Payer: Self-pay | Admitting: Internal Medicine

## 2021-02-06 ENCOUNTER — Ambulatory Visit: Payer: Self-pay | Admitting: Urology

## 2021-02-06 NOTE — Telephone Encounter (Signed)
Spoke to pt and verified she is to repeat BMET this Friday.  Advised pt to have labs in the morning on Friday so that we will have results before the weekend. Pt voiced understanding.

## 2021-02-06 NOTE — Telephone Encounter (Signed)
Patient calling Would like to clarify when to complete upcoming lab work

## 2021-02-07 ENCOUNTER — Telehealth: Payer: Self-pay | Admitting: Internal Medicine

## 2021-02-07 ENCOUNTER — Telehealth: Payer: Self-pay

## 2021-02-07 MED ORDER — PREDNISONE 10 MG PO TABS: ORAL_TABLET | ORAL | 0 refills | Status: DC

## 2021-02-07 NOTE — Telephone Encounter (Signed)
Called and spoke with [name]. Massiah verbalized understanding and scheduled for an appointment with 03/01/21 at 1:30 pm.   [name] had a request to be never be transferred to Access Nurse again. She was very upset at being transferred to access nurse at 10am and states that she should not have been transferred to an After Hours service during normal business hours. I informed her that that is a new procedure and it is slowly being worked into [facility] and it is the new process for our office. I let her know that we do get the messages from Access nurse and call the patient back. Lively states that not all of her [facility] affiliated doctors use access nurse and she does not want to speak to them and have to explain her medical history every time she calls our office with an issue. Please advise.

## 2021-02-07 NOTE — Telephone Encounter (Signed)
Pt called and states that she fell in the shower on 02/06/21. She states that she may have a pinched nerve. She states that she is unable to walk as she is unable to bear any weight on that leg. right leg-upper front thigh pain. She states that she can not sit up right and will not drive to an appt or go to the ED. I transferred to [place] at Access Nurse to triage and let her know that we do not have any appts avail today in office or virtually.

## 2021-02-07 NOTE — Telephone Encounter (Signed)
Patient calling  Wanted to make office aware that she is in extreme pain from a chiropractor visit  Dr [name] placed her on a 6 day taper of prednisone

## 2021-02-07 NOTE — Telephone Encounter (Signed)
Called and spoke with [name]. Denise Sharp states that she is having pain along her lower mid back and lower leg. Pt states that the pain is down near her lower left buttock close to her spine. Pt states that the pain has been present for a while and that she went to a chiropractor and the pain did not subside and got worse. Pt went to the emergency room for Lower back pain and blood in her urine on 01/27/21 and she had to wait in the lobby for hours. Pt states that they did not give her any medication or treatment for her bloody urine or back pain. Pt states that she was laying in bed unable to move because of the pain for around 3 days. She was able to force herself up and make it to the shower where she slipped off of her shower chair. Pt states that her leg pain and back pain are both getting worse and they are keeping her bedridden. Pt declined going the ED or Urgent care due to the long wait times, pt states that she can not stand or sit for too long due to the ongoing pain. Pt states that she will only go if we can guarantee that she can be seen immediately. Pt is concerned about going to the ED and only being sent to another doctors office for the ongoing pain and not being treated same day. Denise Sharp fears that she damaged her spine and possibly has a pinched nerve in her right lower leg. Pt states the pain started on Monday of last week. Pt states it is a 12 on the pain scale.  Pt takes Tramadol and she states that it helps her sleep but does not help with the pain as much. Please Advise.   Pt was very distressed over the Access Nurse calls. She states that she called our office multiple times to only be trasnferred to access nurse. She states that they only referred her to the ED and did not assist her. She was upset that they are not connected to Epic and they have none of her information that is on her chart. She was upset as well that they asked questions that were unrelated to the reason she was calling, like  her diabetes when she called in for back pain.

## 2021-02-08 ENCOUNTER — Ambulatory Visit: Payer: Medicare Other

## 2021-02-08 ENCOUNTER — Observation Stay
Admission: EM | Admit: 2021-02-08 | Discharge: 2021-02-10 | Disposition: A | Discharge status: Home / Self Care | Payer: Medicare Other | Attending: Internal Medicine | Admitting: Internal Medicine

## 2021-02-08 ENCOUNTER — Other Ambulatory Visit: Payer: Medicare Other

## 2021-02-08 ENCOUNTER — Emergency Department: Payer: Medicare Other

## 2021-02-08 ENCOUNTER — Other Ambulatory Visit: Payer: Self-pay

## 2021-02-08 DIAGNOSIS — Z794 Long term (current) use of insulin: Secondary | ICD-10-CM | POA: Insufficient documentation

## 2021-02-08 DIAGNOSIS — I9589 Other hypotension: Principal | ICD-10-CM | POA: Insufficient documentation

## 2021-02-08 DIAGNOSIS — E11610 Type 2 diabetes mellitus with diabetic neuropathic arthropathy: Secondary | ICD-10-CM | POA: Diagnosis present

## 2021-02-08 DIAGNOSIS — I10 Essential (primary) hypertension: Secondary | ICD-10-CM | POA: Diagnosis present

## 2021-02-08 DIAGNOSIS — I5032 Chronic diastolic (congestive) heart failure: Secondary | ICD-10-CM | POA: Diagnosis not present

## 2021-02-08 DIAGNOSIS — S199XXA Unspecified injury of neck, initial encounter: Secondary | ICD-10-CM | POA: Diagnosis not present

## 2021-02-08 DIAGNOSIS — E861 Hypovolemia: Secondary | ICD-10-CM | POA: Insufficient documentation

## 2021-02-08 DIAGNOSIS — R531 Weakness: Secondary | ICD-10-CM | POA: Diagnosis not present

## 2021-02-08 DIAGNOSIS — M852 Hyperostosis of skull: Secondary | ICD-10-CM | POA: Diagnosis not present

## 2021-02-08 DIAGNOSIS — Z7982 Long term (current) use of aspirin: Secondary | ICD-10-CM | POA: Insufficient documentation

## 2021-02-08 DIAGNOSIS — E1122 Type 2 diabetes mellitus with diabetic chronic kidney disease: Secondary | ICD-10-CM | POA: Diagnosis not present

## 2021-02-08 DIAGNOSIS — S3991XA Unspecified injury of abdomen, initial encounter: Secondary | ICD-10-CM | POA: Diagnosis not present

## 2021-02-08 DIAGNOSIS — Z9049 Acquired absence of other specified parts of digestive tract: Secondary | ICD-10-CM | POA: Diagnosis not present

## 2021-02-08 DIAGNOSIS — M549 Dorsalgia, unspecified: Secondary | ICD-10-CM | POA: Diagnosis not present

## 2021-02-08 DIAGNOSIS — Z96653 Presence of artificial knee joint, bilateral: Secondary | ICD-10-CM | POA: Insufficient documentation

## 2021-02-08 DIAGNOSIS — F411 Generalized anxiety disorder: Secondary | ICD-10-CM | POA: Diagnosis present

## 2021-02-08 DIAGNOSIS — R296 Repeated falls: Secondary | ICD-10-CM

## 2021-02-08 DIAGNOSIS — E1142 Type 2 diabetes mellitus with diabetic polyneuropathy: Secondary | ICD-10-CM | POA: Diagnosis not present

## 2021-02-08 DIAGNOSIS — I2510 Atherosclerotic heart disease of native coronary artery without angina pectoris: Secondary | ICD-10-CM | POA: Insufficient documentation

## 2021-02-08 DIAGNOSIS — N183 Chronic kidney disease, stage 3 unspecified: Secondary | ICD-10-CM | POA: Insufficient documentation

## 2021-02-08 DIAGNOSIS — R109 Unspecified abdominal pain: Secondary | ICD-10-CM | POA: Insufficient documentation

## 2021-02-08 DIAGNOSIS — R52 Pain, unspecified: Secondary | ICD-10-CM | POA: Diagnosis not present

## 2021-02-08 DIAGNOSIS — M545 Low back pain, unspecified: Secondary | ICD-10-CM | POA: Diagnosis not present

## 2021-02-08 DIAGNOSIS — R3989 Other symptoms and signs involving the genitourinary system: Secondary | ICD-10-CM | POA: Diagnosis present

## 2021-02-08 DIAGNOSIS — Z20822 Contact with and (suspected) exposure to covid-19: Secondary | ICD-10-CM | POA: Insufficient documentation

## 2021-02-08 DIAGNOSIS — Z79899 Other long term (current) drug therapy: Secondary | ICD-10-CM | POA: Insufficient documentation

## 2021-02-08 DIAGNOSIS — S27322A Contusion of lung, bilateral, initial encounter: Secondary | ICD-10-CM | POA: Diagnosis not present

## 2021-02-08 DIAGNOSIS — I130 Hypertensive heart and chronic kidney disease with heart failure and stage 1 through stage 4 chronic kidney disease, or unspecified chronic kidney disease: Secondary | ICD-10-CM | POA: Insufficient documentation

## 2021-02-08 DIAGNOSIS — M4312 Spondylolisthesis, cervical region: Secondary | ICD-10-CM | POA: Diagnosis not present

## 2021-02-08 DIAGNOSIS — M47812 Spondylosis without myelopathy or radiculopathy, cervical region: Secondary | ICD-10-CM | POA: Diagnosis not present

## 2021-02-08 DIAGNOSIS — D3500 Benign neoplasm of unspecified adrenal gland: Secondary | ICD-10-CM | POA: Diagnosis not present

## 2021-02-08 DIAGNOSIS — M4802 Spinal stenosis, cervical region: Secondary | ICD-10-CM | POA: Diagnosis present

## 2021-02-08 DIAGNOSIS — I959 Hypotension, unspecified: Secondary | ICD-10-CM | POA: Diagnosis not present

## 2021-02-08 DIAGNOSIS — I25118 Atherosclerotic heart disease of native coronary artery with other forms of angina pectoris: Secondary | ICD-10-CM | POA: Diagnosis present

## 2021-02-08 DIAGNOSIS — G319 Degenerative disease of nervous system, unspecified: Secondary | ICD-10-CM | POA: Diagnosis not present

## 2021-02-08 DIAGNOSIS — S0990XA Unspecified injury of head, initial encounter: Secondary | ICD-10-CM | POA: Diagnosis not present

## 2021-02-08 DIAGNOSIS — W19XXXA Unspecified fall, initial encounter: Secondary | ICD-10-CM | POA: Diagnosis not present

## 2021-02-08 LAB — URINE DRUG SCREEN, QUALITATIVE (ARMC ONLY)
Amphetamines, Ur Screen: NOT DETECTED
Barbiturates, Ur Screen: NOT DETECTED
Benzodiazepine, Ur Scrn: POSITIVE — AB
Cannabinoid 50 Ng, Ur ~~LOC~~: NOT DETECTED
Cocaine Metabolite,Ur ~~LOC~~: NOT DETECTED
MDMA (Ecstasy)Ur Screen: NOT DETECTED
Methadone Scn, Ur: NOT DETECTED
Opiate, Ur Screen: POSITIVE — AB
Phencyclidine (PCP) Ur S: NOT DETECTED
Tricyclic, Ur Screen: NOT DETECTED

## 2021-02-08 LAB — TYPE AND SCREEN
ABO/RH(D): A POS
Antibody Screen: NEGATIVE

## 2021-02-08 LAB — CBC
HCT: 44.9 % (ref 36.0–46.0)
Hemoglobin: 15.2 g/dL — ABNORMAL HIGH (ref 12.0–15.0)
MCH: 31.8 pg (ref 26.0–34.0)
MCHC: 33.9 g/dL (ref 30.0–36.0)
MCV: 93.9 fL (ref 80.0–100.0)
Platelets: 279 10*3/uL (ref 150–400)
RBC: 4.78 MIL/uL (ref 3.87–5.11)
RDW: 14 % (ref 11.5–15.5)
WBC: 14.2 10*3/uL — ABNORMAL HIGH (ref 4.0–10.5)
nRBC: 0 % (ref 0.0–0.2)

## 2021-02-08 LAB — URINALYSIS, COMPLETE (UACMP) WITH MICROSCOPIC
Bacteria, UA: NONE SEEN
Bilirubin Urine: NEGATIVE
Glucose, UA: 150 mg/dL — AB
Hgb urine dipstick: NEGATIVE
Ketones, ur: 20 mg/dL — AB
Leukocytes,Ua: NEGATIVE
Nitrite: NEGATIVE
Protein, ur: NEGATIVE mg/dL
Specific Gravity, Urine: 1.034 — ABNORMAL HIGH (ref 1.005–1.030)
Squamous Epithelial / HPF: NONE SEEN (ref 0–5)
pH: 7 (ref 5.0–8.0)

## 2021-02-08 LAB — RESP PANEL BY RT-PCR (FLU A&B, COVID) ARPGX2
Influenza A by PCR: NEGATIVE
Influenza B by PCR: NEGATIVE
SARS Coronavirus 2 by RT PCR: NEGATIVE

## 2021-02-08 LAB — COMPREHENSIVE METABOLIC PANEL
ALT: 20 U/L (ref 0–44)
AST: 42 U/L — ABNORMAL HIGH (ref 15–41)
Albumin: 3.3 g/dL — ABNORMAL LOW (ref 3.5–5.0)
Alkaline Phosphatase: 102 U/L (ref 38–126)
Anion gap: 15 (ref 5–15)
BUN: 27 mg/dL — ABNORMAL HIGH (ref 8–23)
CO2: 29 mmol/L (ref 22–32)
Calcium: 8.8 mg/dL — ABNORMAL LOW (ref 8.9–10.3)
Chloride: 95 mmol/L — ABNORMAL LOW (ref 98–111)
Creatinine, Ser: 1.45 mg/dL — ABNORMAL HIGH (ref 0.44–1.00)
GFR, Estimated: 38 mL/min — ABNORMAL LOW (ref >60)
Glucose, Bld: 191 mg/dL — ABNORMAL HIGH (ref 70–99)
Potassium: 4.4 mmol/L (ref 3.5–5.1)
Sodium: 139 mmol/L (ref 135–145)
Total Bilirubin: 1.9 mg/dL — ABNORMAL HIGH (ref 0.3–1.2)
Total Protein: 7.3 g/dL (ref 6.5–8.1)

## 2021-02-08 LAB — TSH: TSH: 2.209 u[IU]/mL (ref 0.350–4.500)

## 2021-02-08 LAB — LACTIC ACID, PLASMA: Lactic Acid, Venous: 2.4 mmol/L (ref 0.5–1.9)

## 2021-02-08 LAB — GLUCOSE, CAPILLARY: Glucose-Capillary: 200 mg/dL — ABNORMAL HIGH (ref 70–99)

## 2021-02-08 LAB — VITAMIN B12: Vitamin B-12: 685 pg/mL (ref 180–914)

## 2021-02-08 MED ORDER — ONDANSETRON HCL 4 MG/2ML IJ SOLN
4.0000 mg | Freq: Once | INTRAMUSCULAR | Status: AC
  Administered 2021-02-08: 4 mg via INTRAVENOUS
  Filled 2021-02-08: qty 2

## 2021-02-08 MED ORDER — INSULIN ASPART 100 UNIT/ML IJ SOLN
0.0000 [IU] | Freq: Three times a day (TID) | INTRAMUSCULAR | Status: DC
  Administered 2021-02-09 (×2): 4 [IU] via SUBCUTANEOUS
  Administered 2021-02-09: 3 [IU] via SUBCUTANEOUS
  Administered 2021-02-10 (×2): 4 [IU] via SUBCUTANEOUS
  Filled 2021-02-08 (×5): qty 1

## 2021-02-08 MED ORDER — HEPARIN SODIUM (PORCINE) 5000 UNIT/ML IJ SOLN
5000.0000 [IU] | Freq: Three times a day (TID) | INTRAMUSCULAR | Status: DC
  Administered 2021-02-08 – 2021-02-10 (×6): 5000 [IU] via SUBCUTANEOUS
  Filled 2021-02-08 (×6): qty 1

## 2021-02-08 MED ORDER — ATORVASTATIN CALCIUM 20 MG PO TABS
40.0000 mg | ORAL_TABLET | Freq: Every day | ORAL | Status: DC
  Administered 2021-02-09 – 2021-02-10 (×2): 40 mg via ORAL
  Filled 2021-02-08 (×2): qty 2

## 2021-02-08 MED ORDER — ALBUTEROL SULFATE (2.5 MG/3ML) 0.083% IN NEBU: 2.5000 mg | INHALATION_SOLUTION | Freq: Three times a day (TID) | RESPIRATORY_TRACT | Status: DC | PRN

## 2021-02-08 MED ORDER — ASPIRIN EC 81 MG PO TBEC
81.0000 mg | DELAYED_RELEASE_TABLET | Freq: Every day | ORAL | Status: DC
  Administered 2021-02-09 – 2021-02-10 (×2): 81 mg via ORAL
  Filled 2021-02-08 (×2): qty 1

## 2021-02-08 MED ORDER — MORPHINE SULFATE (PF) 2 MG/ML IV SOLN: 1.0000 mg | INTRAVENOUS | Status: DC | PRN

## 2021-02-08 MED ORDER — ONDANSETRON HCL 4 MG/2ML IJ SOLN
4.0000 mg | Freq: Four times a day (QID) | INTRAMUSCULAR | Status: DC | PRN
Start: 2021-02-08 — End: 2021-02-10

## 2021-02-08 MED ORDER — PREGABALIN 50 MG PO CAPS
50.0000 mg | ORAL_CAPSULE | Freq: Two times a day (BID) | ORAL | Status: DC
  Administered 2021-02-08 – 2021-02-10 (×4): 50 mg via ORAL
  Filled 2021-02-08 (×4): qty 1

## 2021-02-08 MED ORDER — ALBUTEROL SULFATE HFA 108 (90 BASE) MCG/ACT IN AERS
2.0000 | INHALATION_SPRAY | Freq: Three times a day (TID) | RESPIRATORY_TRACT | Status: DC | PRN
  Filled 2021-02-08: qty 6.7

## 2021-02-08 MED ORDER — SODIUM CHLORIDE 0.9 % IV BOLUS
500.0000 mL | Freq: Once | INTRAVENOUS | Status: AC
  Administered 2021-02-08: 500 mL via INTRAVENOUS

## 2021-02-08 MED ORDER — DAPAGLIFLOZIN PROPANEDIOL 5 MG PO TABS
10.0000 mg | ORAL_TABLET | Freq: Every day | ORAL | Status: DC
  Administered 2021-02-09 – 2021-02-10 (×2): 10 mg via ORAL
  Filled 2021-02-08 (×2): qty 2

## 2021-02-08 MED ORDER — ACETAMINOPHEN 325 MG PO TABS: 650.0000 mg | ORAL_TABLET | Freq: Four times a day (QID) | ORAL | Status: DC | PRN

## 2021-02-08 MED ORDER — VANCOMYCIN HCL 1500 MG/300ML IV SOLN
1500.0000 mg | Freq: Once | INTRAVENOUS | Status: AC
  Administered 2021-02-08: 1500 mg via INTRAVENOUS
  Filled 2021-02-08: qty 300

## 2021-02-08 MED ORDER — ACETAMINOPHEN 650 MG RE SUPP: 650.0000 mg | Freq: Four times a day (QID) | RECTAL | Status: DC | PRN

## 2021-02-08 MED ORDER — METOPROLOL TARTRATE 25 MG PO TABS
25.0000 mg | ORAL_TABLET | Freq: Two times a day (BID) | ORAL | Status: DC
  Administered 2021-02-08 – 2021-02-10 (×4): 25 mg via ORAL
  Filled 2021-02-08 (×4): qty 1

## 2021-02-08 MED ORDER — ACETAMINOPHEN 500 MG PO TABS
1000.0000 mg | ORAL_TABLET | Freq: Four times a day (QID) | ORAL | Status: DC | PRN
  Administered 2021-02-10: 1000 mg via ORAL
  Filled 2021-02-08: qty 2

## 2021-02-08 MED ORDER — ONDANSETRON HCL 4 MG PO TABS
4.0000 mg | ORAL_TABLET | Freq: Four times a day (QID) | ORAL | Status: DC | PRN
Start: 2021-02-08 — End: 2021-02-10

## 2021-02-08 MED ORDER — LIDOCAINE 5 % EX PTCH
2.0000 | MEDICATED_PATCH | CUTANEOUS | Status: DC
  Administered 2021-02-08 – 2021-02-09 (×2): 2 via TRANSDERMAL
  Filled 2021-02-08 (×3): qty 2

## 2021-02-08 MED ORDER — UMECLIDINIUM-VILANTEROL 62.5-25 MCG/INH IN AEPB
1.0000 | INHALATION_SPRAY | Freq: Every day | RESPIRATORY_TRACT | Status: DC
  Administered 2021-02-08 – 2021-02-10 (×3): 1 via RESPIRATORY_TRACT
  Filled 2021-02-08: qty 14

## 2021-02-08 MED ORDER — IOHEXOL 350 MG/ML SOLN
75.0000 mL | Freq: Once | INTRAVENOUS | Status: AC | PRN
  Administered 2021-02-08: 75 mL via INTRAVENOUS

## 2021-02-08 MED ORDER — KETOROLAC TROMETHAMINE 15 MG/ML IJ SOLN
15.0000 mg | Freq: Three times a day (TID) | INTRAMUSCULAR | Status: AC | PRN
  Administered 2021-02-08 – 2021-02-09 (×2): 15 mg via INTRAVENOUS
  Filled 2021-02-08 (×3): qty 1

## 2021-02-08 MED ORDER — NITROGLYCERIN 0.4 MG SL SUBL: 0.4000 mg | SUBLINGUAL_TABLET | SUBLINGUAL | Status: DC | PRN

## 2021-02-08 MED ORDER — INSULIN GLARGINE-YFGN 100 UNIT/ML ~~LOC~~ SOLN
15.0000 [IU] | Freq: Every day | SUBCUTANEOUS | Status: DC
  Administered 2021-02-08 – 2021-02-09 (×2): 15 [IU] via SUBCUTANEOUS
  Filled 2021-02-08 (×4): qty 0.15

## 2021-02-08 MED ORDER — SODIUM CHLORIDE 0.9 % IV BOLUS
1000.0000 mL | Freq: Once | INTRAVENOUS | Status: AC
  Administered 2021-02-08: 1000 mL via INTRAVENOUS

## 2021-02-08 MED ORDER — INSULIN ASPART 100 UNIT/ML IJ SOLN
0.0000 [IU] | Freq: Every day | INTRAMUSCULAR | Status: DC
  Administered 2021-02-09: 2 [IU] via SUBCUTANEOUS
  Filled 2021-02-08: qty 1

## 2021-02-08 MED ORDER — SODIUM CHLORIDE 0.9 % IV SOLN
1.0000 g | Freq: Once | INTRAVENOUS | Status: AC
  Administered 2021-02-08: 1 g via INTRAVENOUS
  Filled 2021-02-08: qty 10

## 2021-02-08 MED ORDER — DEXAMETHASONE SODIUM PHOSPHATE 10 MG/ML IJ SOLN
10.0000 mg | Freq: Two times a day (BID) | INTRAMUSCULAR | Status: DC | PRN
  Filled 2021-02-08: qty 1

## 2021-02-08 MED ORDER — ONDANSETRON HCL 4 MG/2ML IJ SOLN
INTRAMUSCULAR | Status: AC
  Administered 2021-02-08: 2 mg
  Filled 2021-02-08: qty 2

## 2021-02-08 MED ORDER — FENTANYL CITRATE (PF) 100 MCG/2ML IJ SOLN
50.0000 ug | INTRAMUSCULAR | Status: DC | PRN
  Filled 2021-02-08: qty 2

## 2021-02-08 MED ORDER — VANCOMYCIN HCL IN DEXTROSE 1-5 GM/200ML-% IV SOLN
1000.0000 mg | Freq: Once | INTRAVENOUS | Status: AC
  Administered 2021-02-08: 1000 mg via INTRAVENOUS
  Filled 2021-02-08: qty 200

## 2021-02-08 MED ORDER — SEMAGLUTIDE(0.25 OR 0.5MG/DOS) 2 MG/1.5ML ~~LOC~~ SOPN: 0.5000 mg | PEN_INJECTOR | SUBCUTANEOUS | Status: DC

## 2021-02-08 NOTE — ED Provider Notes (Signed)
[facility] Emergency Department Provider Note    Event Date/Time   First MD Initiated Contact with Patient 02/08/21 1023     (approximate)  I have reviewed the triage vital signs and the nursing notes.   HISTORY  Chief Complaint No chief complaint on file.    HPI LIBORIA PUTNAM is a 75 y.o. female below listed past medical history presents to the ER for evaluation of malaise weakness and fall.  Has had multiple falls in the past week due to increasing weakness.  Describes as a generalized feeling of malaise no lateralizing numbness or weakness.  Has had decreased p.o. intake.  Has been on diuretics recently for reported history of congestive heart failure.  No recent antibiotics.  Denies any chest pain or pressure.  Past Medical History:  Diagnosis Date   (HFpEF) heart failure with preserved ejection fraction ([place])    a. 12/2019 Echo: EF 65-70%, Gr2 DD. No significant valvular dzs.   Abnormality of gait 03/25/2013   Adrenal mass, left ([place])    Anemia    iron deficient post  2 unit txfsn 2009, normal endo/colonoscopy by Wohl   Arthritis    CAD (coronary artery disease) with h/o Atypical Chest Pain    a. 04/1986 Cath ([facility]): nl cors, EF 65%; b. 07/2012 Cath [facility]): Diff minor irregs; c. 07/2016 MV [facility]): "Equivocal"; d. 08/2016 Cardiac CT Ca2+ score [facility]): Ca2+ 2841; e. 05/2019 MV: No ischemia. EF 75%; f. 12/2019 PCI: LM nl, LAD 65ost (3.5x12 Resolute DES), 60m (2.75x12 Resolute DES),LCX/RCA nl; g. 08/2020 Cath: patent LAD stents, RCA 40ost, elev RH pressures, EF >65%.   Cervical spinal stenosis 1994   due to trauma to back ([employer] accident), has intermittent paralysis and parasthesias   Cervicogenic headache 03/23/2014   Depression    Dizziness    chronic dizziness   DJD (degenerative joint disease)    a. Chronic R shoulder pain pending R shoulder replacement 07/2017.   Esophageal stenosis March 2011   with transietn outlet obstruction by food, cleared by  EGD    Family history of adverse reaction to anesthesia    daughter PONV   Gastric bypass status for obesity    Headache(784.0)    Hypertension    IBS (irritable bowel syndrome)    Left bundle branch block (LBBB)    a. Intermittently present - likely rate related. - patient denies   Obesity    Obstructive sleep apnea    using CPAP   Polyneuropathy in diabetes(357.2) 03/25/2013   Restless leg syndrome    Rotator cuff arthropathy, right 08/13/2017   Syncope and collapse 03/12/2014   Type II diabetes mellitus ([place])    Family History  Problem Relation Age of Onset   Heart disease Father    Hypertension Father    Prostate cancer Father    Stroke Father    Osteoporosis Father    Stroke Mother    Depression Mother    Headache Mother    Heart disease Mother    Thyroid disease Mother    Hypertension Mother    Diabetes Daughter    Heart disease Daughter    Hypertension Daughter    Hypertension Son    Past Surgical History:  Procedure Laterality Date   ABDOMINAL HYSTERECTOMY     APPENDECTOMY     CARDIAC CATHETERIZATION     COLONOSCOPY     CORONARY STENT INTERVENTION N/A 01/04/2020   Procedure: CORONARY STENT INTERVENTION;  Surgeon: [name], MD;  Location: [place] [facility];  Service: Cardiovascular;  Laterality: N/A;  LAD    DIAPHRAGMATIC HERNIA REPAIR  2015   ESOPHAGEAL DILATION     multiple   ESOPHAGOGASTRODUODENOSCOPY     GALLBLADDER SURGERY  1976   GALLBLADDER SURGERY  resection   GASTRIC BYPASS     GASTRIC BYPASS  2000, 2005   Dr. [name] IMPLANT PLACEMENT  April 2013   [place] REPLACEMENT  2007   bilateral knee. [name],  [name]   LEFT HEART CATH AND CORS/GRAFTS ANGIOGRAPHY N/A 12/31/2019   Procedure: LEFT HEART CATH AND CORS/GRAFTS ANGIOGRAPHY;  Surgeon: [name], MD;  Location: [place] [facility];  Service: Cardiovascular;  Laterality: N/A;   PANNICULECTOMY  06/16/2019   PANNICULECTOMY N/A 06/16/2019   Procedure:  PANNICULECTOMY;  Surgeon: [name], MD;  Location: [place];  Service: Plastics;  Laterality: N/A;  3 hours, please   REVERSE SHOULDER ARTHROPLASTY Right 08/13/2017   Procedure: REVERSE RIGHT SHOULDER ARTHROPLASTY;  Surgeon: [name], MD;  Location: [place];  Service: Orthopedics;  Laterality: Right;   RIGHT/LEFT HEART CATH AND CORONARY ANGIOGRAPHY N/A 09/06/2020   Procedure: RIGHT/LEFT HEART CATH AND CORONARY ANGIOGRAPHY;  Surgeon: [name], MD;  Location: [place] [facility];  Service: Cardiovascular;  Laterality: N/A;   ROTATOR CUFF REPAIR     right   SPINE SURGERY  1995   [place]   TOTAL ABDOMINAL HYSTERECTOMY W/ BILATERAL SALPINGOOPHORECTOMY  5009   UMBILICAL HERNIA REPAIR  Aug 11, 2015   Patient Active Problem List   Diagnosis Date Noted   PSVT (paroxysmal supraventricular tachycardia) ([place]) 12/15/2020   CKD stage 3 due to type 2 diabetes mellitus ([place]) 12/09/2020   Temporal arteritis syndrome ([place]) 11/17/2020   Other pulmonary embolism with acute cor pulmonale, unspecified chronicity ([place]) 11/17/2020   Mild episode of recurrent major depressive disorder ([place]) 11/17/2020   Hyperlipidemia associated with type 2 diabetes mellitus ([place]) 08/21/2020   Iron deficiency 06/28/2020   Skin lesion of chest wall 06/10/2020   Elevated alkaline phosphatase level 06/10/2020   Coagulation defect ([place]) 06/06/2020   Chronic heart failure with preserved ejection fraction (HFpEF) ([place]) 01/14/2020   Hyperlipidemia LDL goal <70 01/14/2020   Coronary artery disease involving native coronary artery of native heart without angina pectoris 01/13/2020   S/P cardiac catheterization 01/13/2020   Accelerating angina ([place]) 12/31/2019   Dysphagia 11/30/2019   Bladder pain 08/23/2019   Gastritis 08/07/2019   Lab test negative for COVID-19 virus 08/07/2019   S/P panniculectomy 06/16/2019   Major depressive disorder with current active episode 05/30/2019   Exposure to COVID-19 virus 12/25/2018    Functional diarrhea 10/22/2018   Bilateral cataracts 06/26/2018   Status post bariatric surgery 02/05/2018   GAD (generalized anxiety disorder) 02/01/2018   Rectocele 01/24/2018   Vaginal prolapse 01/04/2018   Hepatic steatosis 01/04/2018   Microalbuminuria due to type 2 diabetes mellitus ([place]) 11/21/2017   Hospital discharge follow-up 09/22/2017   Hypocalcemia 09/22/2017   Hypotension 09/22/2017   Fecal incontinence 09/22/2017   B12 deficiency 09/22/2017   Transient disorientation 08/20/2017   Rotator cuff arthropathy, right 08/13/2017   Primary localized osteoarthrosis of shoulder 08/13/2017   Encounter for preoperative examination for general surgical procedure 08/03/2017   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 ([place]) 08/03/2017   Candidiasis, intertrigo 08/03/2017   Venous stasis dermatitis of right lower extremity 11/21/2016   Hypersomnia, persistent 06/18/2015   Mechanical breakdown of implanted electronic neurostimulator of  peripheral nerve ([place]) 10/16/2014   Obesity hypoventilation syndrome ([place]) 08/13/2014   Frequent falls 06/23/2014   Chronic cough 05/05/2014   Adenoma of left adrenal gland 03/24/2014   New onset of headaches after age 59 03/23/2014   Vitamin D deficiency 01/06/2014   Weight gain following gastric bypass surgery 12/03/2013   Morbid obesity ([place]) 08/25/2013   Diaphragmatic hernia 04/06/2013   Diabetic polyneuropathy ([place]) 03/25/2013   Abnormality of gait 03/25/2013   Multiple pulmonary nodules 03/20/2013   DOE (dyspnea on exertion) 03/20/2013   Iron deficiency anemia 08/06/2012   Functional disorder of bladder 07/15/2012   Incomplete bladder emptying 07/15/2012   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 Authorizing Provider  albuterol (VENTOLIN HFA) 108 (90 Base) MCG/ACT inhaler Inhale 2 puffs into the lungs every 8 (eight) hours as needed for wheezing or shortness of breath. 08/07/19   [name], MD  aspirin EC 81 MG tablet Take 1 tablet (81 mg total) by mouth daily. 01/05/20   Fritzi Mandes, MD  atorvastatin (LIPITOR) 40 MG tablet Take 1 tablet (40 mg total) by mouth daily. 11/29/20   [name], MD  clotrimazole-betamethasone (LOTRISONE) cream Apply 1 application topically 2 (two) times daily as needed.    [provider]  Continuous Blood Gluc Receiver (FREESTYLE LIBRE 2 READER) DEVI Use to check sugar at least TID 09/26/20   [name], MD  cyanocobalamin (,VITAMIN B-12,) 1000 MCG/ML injection Inject 1 mL (1,000 mcg total) into the muscle every 30 (thirty) days. Inject 1 ml (1000 mcg ) IM weekly x 4,  Then monthly thereafter 01/09/21   [name], MD  Dapagliflozin Propanediol (FARXIGA PO) Take 10 mg by mouth daily.    [provider]  diphenhydrAMINE (BENADRYL) 25 MG tablet Take 50 mg by mouth daily as needed for allergies.    [provider]  diphenoxylate-atropine (LOMOTIL) 2.5-0.025 MG tablet Take 1 tablet by mouth 4 (four) times daily as needed for diarrhea or loose stools. 10/20/20   [name], MD  fexofenadine (ALLEGRA) 180 MG tablet Take 180 mg by mouth daily.    [provider]  hyoscyamine (ANASPAZ) 0.125 MG TBDP disintergrating tablet DISSOLVE UNDER TONGUE 15 MINUTES BEFORE MEALS 04/21/20   [name], MD  insulin aspart (NOVOLOG FLEXPEN) 100 UNIT/ML FlexPen Inject 8-16 units up to 3 times daily before meals 01/19/21   [name], MD  insulin glargine (LANTUS  SOLOSTAR) 100 UNIT/ML Solostar Pen Inject 15 Units into the skin daily. 10/26/20   [name], MD  Insulin Pen Needle (DROPLET PEN NEEDLES) 31G X 5 MM MISC USE ONE NEEDLE SUBCUTANEOUSLY AS DIRECTED (REMOVE AND DISCARD NEEDLE IN SHARPS CONTAINER IMMEDIATELY AFTER USE) 06/13/20   [name], MD  KLOR-CON M20 20 MEQ tablet TAKE 1 TABLET BY MOUTH EVERY DAY 02/03/21   End, Harrell Gave, MD  loperamide (IMODIUM) 2 MG capsule Take by mouth as needed.    [provider]  metoprolol tartrate (LOPRESSOR) 25 MG tablet Take  1 tablet (25 mg total) by mouth 2 (two) times daily. 01/30/21 04/30/21  Furth, Cadence H, PA-C  nitroGLYCERIN (NITROSTAT) 0.4 MG SL tablet Place 1 tablet (0.4 mg total) under the tongue every 5 (five) minutes as needed for chest pain. 09/23/20   Theora Gianotti, NP  NONFORMULARY OR COMPOUNDED [place]  Combination Pain Cream -  Baclofen 2%, Doxepin 5%, Gabapentin 6%, Topiramate 2%, Pentoxifylline 3% Apply 1-2 grams to affected area 3-4 times daily Qty. 120 gm 3 refills 10/14/17   Gardiner Barefoot, DPM  nystatin (MYCOSTATIN/NYSTOP) powder Apply 1 g topically 4 (four) times daily as needed (rash).    [provider]  predniSONE (DELTASONE) 10 MG tablet 6 tablets on Day 1 , then reduce by 1 tablet daily until gone 02/07/21   [name], MD  pregabalin (LYRICA) 50 MG capsule TAKE 1 CAPSULE BY MOUTH 2 TIMES DAILY. 01/02/21   Kathrynn Ducking, MD  Semaglutide,0.25 or 0.5MG /DOS, (OZEMPIC, 0.25 OR 0.5 MG/DOSE,) 2 MG/1.5ML SOPN Inject 0.5 mg into the skin once a week. 01/19/21   [name], MD  torsemide (DEMADEX) 10 MG tablet Take 1 tablet (10 mg total) by mouth daily. 02/03/21   Furth, Cadence H, PA-C  traMADol (ULTRAM) 50 MG tablet Take 0.5 tablets (25 mg total) by mouth every 12 (twelve) hours as needed for severe pain. 08/21/17   Regalado, Belkys A, MD  umeclidinium-vilanterol (ANORO ELLIPTA) 62.5-25 MCG/INH AEPB Inhale 1 puff into the lungs daily.  05/24/20   [name], MD    Allergies Demeclocycline, Erythromycin, Flagyl [metronidazole], Glucophage [metformin hcl], Tetracyclines & related, Diovan [valsartan], Sulfa antibiotics, and Xanax [alprazolam]    Social History Social History   Tobacco Use   Smoking status: Never   Smokeless tobacco: Never  Vaping Use   Vaping Use: Never used  Substance Use Topics   Alcohol use: No   Drug use: Yes    Comment: prescribed valium    Review of Systems Patient denies headaches, rhinorrhea, blurry vision, numbness, shortness of breath, chest pain, edema, cough, abdominal pain, nausea, vomiting, diarrhea, dysuria, fevers, rashes or hallucinations unless otherwise stated above in HPI. ____________________________________________   PHYSICAL EXAM:  VITAL SIGNS: Vitals:   02/08/21 1331 02/08/21 1443  BP: (!) 93/48 (!) 114/43  Pulse: 78 68  Resp: 16 16  Temp:    SpO2: 99% 98%    Constitutional: Alert and oriented. Frail and elderly appearing Eyes: Conjunctivae are normal.  Head: Atraumatic. Nose: No congestion/rhinnorhea. Mouth/Throat: Mucous membranes are moist.   Neck: No stridor. Painless ROM.  Cardiovascular: Normal rate, regular rhythm. Grossly normal heart sounds.  Good peripheral circulation. Respiratory: Normal respiratory effort.  No retractions. Lungs CTAB. Gastrointestinal: Soft with mild  No distention. No abdominal bruits. No CVA tenderness. Genitourinary:  Musculoskeletal: No lower extremity tenderness nor edema.  No joint effusions. Neurologic:  Normal speech and language. No gross focal neurologic deficits are appreciated. No facial droop Skin:  Skin is warm, dry and intact. No rash noted. Psychiatric: Mood and affect are normal. Speech and behavior are normal.  ____________________________________________   LABS (all labs ordered are listed, but only abnormal results are displayed)  Results for orders placed or performed during the hospital encounter  of 02/08/21 (from the past 24 hour(s))  CBC     Status: Abnormal   Collection Time: 02/08/21 10:54 AM  Result Value Ref Range   WBC 14.2 (H) 4.0 - 10.5 K/uL   RBC 4.78 3.87 - 5.11 MIL/uL  Hemoglobin 15.2 (H) 12.0 - 15.0 g/dL   HCT 44.9 36.0 - 46.0 %   MCV 93.9 80.0 - 100.0 fL   MCH 31.8 26.0 - 34.0 pg   MCHC 33.9 30.0 - 36.0 g/dL   RDW 14.0 11.5 - 15.5 %   Platelets 279 150 - 400 K/uL   nRBC 0.0 0.0 - 0.2 %  Comprehensive metabolic panel     Status: Abnormal   Collection Time: 02/08/21 10:54 AM  Result Value Ref Range   Sodium 139 135 - 145 mmol/L   Potassium 4.4 3.5 - 5.1 mmol/L   Chloride 95 (L) 98 - 111 mmol/L   CO2 29 22 - 32 mmol/L   Glucose, Bld 191 (H) 70 - 99 mg/dL   BUN 27 (H) 8 - 23 mg/dL   Creatinine, Ser 1.45 (H) 0.44 - 1.00 mg/dL   Calcium 8.8 (L) 8.9 - 10.3 mg/dL   Total Protein 7.3 6.5 - 8.1 g/dL   Albumin 3.3 (L) 3.5 - 5.0 g/dL   AST 42 (H) 15 - 41 U/L   ALT 20 0 - 44 U/L   Alkaline Phosphatase 102 38 - 126 U/L   Total Bilirubin 1.9 (H) 0.3 - 1.2 mg/dL   GFR, Estimated 38 (L) >60 mL/min   Anion gap 15 5 - 15  Lactic acid, plasma     Status: Abnormal   Collection Time: 02/08/21 11:13 AM  Result Value Ref Range   Lactic Acid, Venous 2.4 (HH) 0.5 - 1.9 mmol/L  Type and screen [place]     Status: None   Collection Time: 02/08/21 12:29 PM  Result Value Ref Range   ABO/RH(D) A POS    Antibody Screen NEG    Sample Expiration      02/11/2021,2359 Performed at [facility], [address] [place], [place] [address]   Urinalysis, Complete w Microscopic     Status: Abnormal   Collection Time: 02/08/21  1:23 PM  Result Value Ref Range   Color, Urine YELLOW (A) YELLOW   APPearance CLEAR (A) CLEAR   Specific Gravity, Urine 1.034 (H) 1.005 - 1.030   pH 7.0 5.0 - 8.0   Glucose, UA 150 (A) NEGATIVE mg/dL   Hgb urine dipstick NEGATIVE NEGATIVE   Bilirubin Urine NEGATIVE NEGATIVE   Ketones, ur 20 (A) NEGATIVE mg/dL   Protein,  ur NEGATIVE NEGATIVE mg/dL   Nitrite NEGATIVE NEGATIVE   Leukocytes,Ua NEGATIVE NEGATIVE   RBC / HPF 0-5 0 - 5 RBC/hpf   WBC, UA 0-5 0 - 5 WBC/hpf   Bacteria, UA NONE SEEN NONE SEEN   Squamous Epithelial / LPF NONE SEEN 0 - 5   Mucus PRESENT   Resp Panel by RT-PCR (Flu A&B, Covid) Nasopharyngeal Swab     Status: None   Collection Time: 02/08/21  1:23 PM   Specimen: Nasopharyngeal Swab; Nasopharyngeal(NP) swabs in vial transport medium  Result Value Ref Range   SARS Coronavirus 2 by RT PCR NEGATIVE NEGATIVE   Influenza A by PCR NEGATIVE NEGATIVE   Influenza B by PCR NEGATIVE NEGATIVE   *Note: Due to a large number of results and/or encounters for the requested time period, some results have not been displayed. A complete set of results can be found in Results Review.   ____________________________________________ ____________________________________________  [id number]  I personally reviewed all radiographic images ordered to evaluate for the above acute complaints and reviewed radiology reports and findings.  These findings were personally discussed with the patient.  Please see medical record for radiology report.  ____________________________________________   PROCEDURES  Procedure(s) performed:  Procedures    Critical Care performed: no ____________________________________________   INITIAL IMPRESSION / ASSESSMENT AND PLAN / ED COURSE  Pertinent labs & imaging results that were available during my care of the patient were reviewed by me and considered in my medical decision making (see chart for details).   DDX: sepsis, dehydration, electrolyte abn, uti, pyelo, hematoma, covid,  FLORETTE THAI is a 75 y.o. who presents to the ED with his as described above.  Patient frail-appearing hypotensive mentating appropriately has had falls and increasing weakness.  Concern for possible sepsis.  Blood work sent for above differential.  Will order CT imaging.  Clinical Course  as of 02/08/21 1543  Wed Feb 08, 2021  1230 Lactate is elevated given her leukocytosis low blood pressure will start Rocephin she was reportedly showing signs of hematuria and possible cystitis recently. [PR]  1356 Urine very purulent appearing likely source of infection.  CT imaging reviewed does not show any evidence of stone or hydronephrosis or other source of infection. [PR]  1457 Urinalysis without evidence of clear infection may be secondary to poor hygiene.  Blood pressure is improved with IV hydration may be simply secondary to significant diuresis but given white count and positive sirs criteria will continue to plan to cover with antibiotics and discussed with hospitalist for observation given concern for sepsis with SIRS. [PR]    Clinical Course User Index [PR] Merlyn Lot, MD    The patient was evaluated in Emergency Department today for the symptoms described in the history of present illness. He/she was evaluated in the context of the global COVID-19 pandemic, which necessitated consideration that the patient might be at risk for infection with the SARS-CoV-2 virus that causes COVID-19. Institutional protocols and algorithms that pertain to the evaluation of patients at risk for COVID-19 are in a state of rapid change based on information released by regulatory bodies including the CDC and federal and state organizations. These policies and algorithms were followed during the patient's care in the ED.  As part of my medical decision making, I reviewed the following data within the [facility] notes reviewed and incorporated, Labs reviewed, notes from prior ED visits and [place] Controlled Substance Database   ____________________________________________   FINAL CLINICAL IMPRESSION(S) / ED DIAGNOSES  Final diagnoses:  Weakness  Hypotension due to hypovolemia      NEW MEDICATIONS STARTED DURING THIS VISIT:  New Prescriptions   No medications on file      Note:  This document was prepared using Dragon voice recognition software and may include unintentional dictation errors.    Merlyn Lot, MD 02/08/21 [phone]

## 2021-02-08 NOTE — Plan of Care (Signed)

## 2021-02-08 NOTE — ED Triage Notes (Signed)
BIB EMS from home. Fall on monday. CC is pain in the buttocks region.  Vitals WNL for EMS. BGL 210. Pt states she hasnt had her insulin today. She hasnt had her heart meds x 3 days.

## 2021-02-08 NOTE — Telephone Encounter (Signed)
Routing to Dr. Saunders Revel as an Juluis Rainier.

## 2021-02-08 NOTE — Consult Note (Signed)
PHARMACY -  BRIEF ANTIBIOTIC NOTE   Pharmacy has received consult(s) for Vancomycin from an ED provider.  The patient's profile has been reviewed for ht/wt/allergies/indication/available labs.    One time order(s) placed for: Vancomycin 2.5g x1 in ED.  Further antibiotics/pharmacy consults should be ordered by admitting physician if indicated.                       Thank you, Lorna Dibble 02/08/2021  3:05 PM

## 2021-02-08 NOTE — H&P (Signed)
History and Physical   Denise Sharp LAG:[id number] DOB: [birth year] DOA: 02/08/2021  PCP: [name], MD  Outpatient Specialists: Dr. Saunders Revel, cardiologist Patient coming from: Home via EMS  I have personally briefly reviewed patient's old medical records in [place].  Chief Concern: Frequent falls  HPI: Denise Sharp is a 75 y.o. female with medical history significant for coronary artery disease status post PCI to LAD, obesity, hypertension, obstructive sleep apnea, hyperlipidemia, insulin-dependent diabetes mellitus, chronic back pain, presents emergency department for chief concerns of frequent falls and generalized weakness.  Patient was taking a shower and sitting on the building shower bench when she stood up, she slipped on the wet and sudsy shower floor and hit her low back.  Patient reports this happened approximately 02/06/2021.  Since then patient has been hurting, not able to get out of bed, and had nausea and not able to take her p.o. medications for the last several days.  She has only taken her long-acting insulin.  At bedside, she endorses low back pain with occasional sharp pain down her right leg.  She denies numbness of her sacrum.  She reports that she called her primary care doctor and her primary care doctor refused to give her pain medications however ultimately relented and prescribed to her 6 days of prednisone.  At bedside patient denies chest pain, shortness of breath, fever, dysuria. Patient states she has frequent diarrhea and this is normal for her.  Social history: She lives at home with her husband.  She denies tobacco, EtOH, recreational drug use.  ROS: Constitutional: no weight change, no fever ENT/Mouth: no sore throat, no rhinorrhea Eyes: no eye pain, no vision changes Cardiovascular: no chest pain, no dyspnea,  no edema, no palpitations Respiratory: no cough, no sputum, no wheezing Gastrointestinal: no nausea, no vomiting, no diarrhea,  no constipation Genitourinary: no urinary incontinence, no dysuria, no hematuria Musculoskeletal: no arthralgias, no myalgias Skin: no skin lesions, no pruritus, Neuro: + weakness, no loss of consciousness, no syncope Psych: no anxiety, no depression, + decrease appetite Heme/Lymph: no bruising, no bleeding  ED Course: Discussed with emergency medicine provider, patient requiring hospitalization for meeting sepsis criteria.  Vitals in the emergency department was remarkable for temperature 90.5, respiration rate of 16, heart rate of 66, initial blood pressure 87/30, and improved to 114/43.  SPO2 of 99% on room air.  Labs in the emergency department was remarkable for sodium 139, potassium 4.4, chloride 95, bicarb 29, BUN 27, serum creatinine of 1.45, nonfasting blood glucose 191.  WBC 14.2, hemoglobin 15.2, platelets 279, EGFR 38.  Lactic acid was initially elevated at 2.4.  T bili 1.9.  COVID/influenza A/influenza B PCR were negative.  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  Moderate chronic microvascular ischemic disease.   - CT cervical spine without contrast was read as no evidence of acute fracture or traumatic malalignment.  Similar multilevel degenerative change, greatest at C5-C6. - CT chest abdomen and pelvis with contrast was ordered and read as no acute findings in the  chest, abdomen, pelvis.  Stable left adrenal gland adenoma.  Surgical changes from gastric bypass surgery. No complicating features identified.  Status postcholecystectomy. No biliary dilatation. - CT of the L-spine was read as no evidence of acute fracture or traumatic malalignment.  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 Osteopenia.  Radiologist read that MRI could better characterize the canal and foramina.   - MRI with and without contrast of the lumbar spine was ordered however patient has neurostimulator and paperwork states but the MRI of the head can only be done under special circumstances - Checking TSH, B12 - Low clinical suspicion for cardiovascular etiology at this time due to patient recently had nuclear medicine pharmacological myocardial perfusion study and was read as no significant ischemia.  Normal wall motion.  No EKG changes concerning for ischemia at peak stress or in recovery. CT attenuation correction images with no significant aortic atherosclerosis, stents noted in the LAD -Pain control: Acetaminophen 1000 as needed for mild pain, ketorolac 15 mg IV every 8 hours as needed for moderate pain, morphine 1 mg IV every 2 hours as needed for severe pain - If the above pain is not appropriately controlled with the above intervention, a.m. team to consider CT lumbar spine with contrast to assess for compressive foraminal narrowing however at this time patient denies saddle anesthesia - Decadron 10 mg IV every 12 hours for pain, two doses ordered - PT, OT, fall precautions  # Hypertension-resumed metoprolol tartrate 25 mg p.o. twice daily  # Obesity-recommended weight loss safely outpatient - Status post gastric bypass surgery  # Obstructive sleep apnea-CPAP nightly ordered  # Diabetic polyneuropathy-resumed Lyrica 50 mg PO BID  # Insulin-dependent diabetes mellitus - Resumed home long-acting 15 units  subcutaneous nightly - Resumed semaglutide 0.5 mg subcutaneous every Sunday - Farxiga 10 mg p.o. daily - Insulin SSI with at bedtime coverage ordered Generalized anxiety disorder  # Coronary artery disease-resumed aspirin 81 mg daily, atorvastatin 40 mg nightly, metoprolol tartrate 25 mg twice daily - Patient does not have chest pain at this time - Resumed nitroglycerin 0.4 mg sublingual every 5 minutes as needed for chest pain  # Polypharmacy putting patient at risk for frequent falls - Extensive education with daughter Margreta Journey that patient should not be taking Benadryl if can be avoided - Recommended Allegra nonsedating and/or Claritin nonsedating over-the-counter for allergies - Holding home tramadol - Extensively discussed that the combination of Lyrica, tramadol, Benadryl will increase patient's risk of falling and respiratory distress  # Lumbar ecchymosis-secondary to trauma in shower, present on admission, approximately 20 to 25 cm in length and 7 to 10 cm in diameter that is dark purple  Chart reviewed.   Holter monitor 11/15/2020: worn monitor for 4 days, 22 hours.  Predominant rhythm with sinus with average rate of 77 bpm. Rare PACs, PVCs.  11 episode of nonsustained ventricular tachycardia were observed, lasting up to 8 beats with a maximum rate of 210 bpm. Patient was advised by cardiology service to take metoprolol 12.5 mg twice daily.  No additional work-up is required at this time.  Left heart cath on 09/06/2020: Was read as nonobstructive coronary artery disease with 40% ostial RCA stenosis.  Widely patent ostial/proximal and mid LAD stents, with distal LAD tapering to small vessel.  Mildly elevated left heart and  pulmonary artery pressures.  Severely elevated right heart filling pressures.  Low Fick cardiac output/index consistent with heart failure preserved ejection fraction.  Left heart cath on 01/04/2020: Patient had successful PCI and drug-eluting stent placement to mid as  well as ostial LAD.  Patient was recommended to remain on dual antiplatelet therapy for at least 6 months  Cardiac echo on 12/30/2019: Ejection fraction estimated at 65 to 70%.  Left ventricle has normal function.  Left ventricle has no regional wall motion abnormalities.  Grade 2 diastolic dysfunction.  DVT prophylaxis: Heparin 5000 units every 8 hours subcutaneous Code Status: Full code Diet: Heart healthy/carb modified Family Communication: Updated daughter, Margreta Journey Disposition Plan: Pending clinical course Consults called: None at this time Admission status: MedSurg, observation, 24 hours telemetry ordered  Past Medical History:  Diagnosis Date   (HFpEF) heart failure with preserved ejection fraction ([place])    a. 12/2019 Echo: EF 65-70%, Gr2 DD. No significant valvular dzs.   Abnormality of gait 03/25/2013   Adrenal mass, left ([place])    Anemia    iron deficient post  2 unit txfsn 2009, normal endo/colonoscopy by [facility]   Arthritis    CAD (coronary artery disease) with h/o Atypical Chest Pain    a. 04/1986 Cath ([facility]): nl cors, EF 65%; b. 07/2012 Cath [facility]): Diff minor irregs; c. 07/2016 MV [facility]): "Equivocal"; d. 08/2016 Cardiac CT Ca2+ score [facility]): Ca2+ 3428; e. 05/2019 MV: No ischemia. EF 75%; f. 12/2019 PCI: LM nl, LAD 65ost (3.5x12 Resolute DES), 33m(2.75x12 Resolute DES),LCX/RCA nl; g. 08/2020 Cath: patent LAD stents, RCA 40ost, elev RH pressures, EF >65%.   Cervical spinal stenosis 1994   due to trauma to back ([employer] accident), has intermittent paralysis and parasthesias   Cervicogenic headache 03/23/2014   Depression    Dizziness    chronic dizziness   DJD (degenerative joint disease)    a. Chronic R shoulder pain pending R shoulder replacement 07/2017.   Esophageal stenosis March 2011   with transietn outlet obstruction by food, cleared by EGD    Family history of adverse reaction to anesthesia    daughter PONV   Gastric bypass status for obesity    Headache(784.0)     Hypertension    IBS (irritable bowel syndrome)    Left bundle branch block (LBBB)    a. Intermittently present - likely rate related. - patient denies   Obesity    Obstructive sleep apnea    using CPAP   Polyneuropathy in diabetes(357.2) 03/25/2013   Restless leg syndrome    Rotator cuff arthropathy, right 08/13/2017   Syncope and collapse 03/12/2014   Type II diabetes mellitus ([place]    Past Surgical History:  Procedure Laterality Date   ABDOMINAL HYSTERECTOMY     APPENDECTOMY     CARDIAC CATHETERIZATION     COLONOSCOPY     CORONARY STENT INTERVENTION N/A 01/04/2020   Procedure: CORONARY STENT INTERVENTION;  Surgeon: AWellington Hampshire MD;  [facility];  Service: Cardiovascular;  Laterality: N/A;  LAD    DIAPHRAGMATIC HERNIA REPAIR  2015   ESOPHAGEAL DILATION     multiple   ESOPHAGOGASTRODUODENOSCOPY     GALLBLADDER SURGERY  1976   GALLBLADDER SURGERY  resection   GASTRIC BYPASS     GASTRIC BYPASS  2000, 2005   Dr. REra SkeenIMPLANT PLACEMENT  April 2013   [place]an[place]PLACEMENT  2007   bilateral knee. [name],  [name]   LEFT HEART CATH  AND CORS/GRAFTS ANGIOGRAPHY N/A 12/31/2019   Procedure: LEFT HEART CATH AND CORS/GRAFTS ANGIOGRAPHY;  Surgeon: [name], MD;  Location: [place] [facility];  Service: Cardiovascular;  Laterality: N/A;   PANNICULECTOMY  06/16/2019   PANNICULECTOMY N/A 06/16/2019   Procedure: PANNICULECTOMY;  Surgeon: [name], MD;  Location: [place];  Service: Plastics;  Laterality: N/A;  3 hours, please   REVERSE SHOULDER ARTHROPLASTY Right 08/13/2017   Procedure: REVERSE RIGHT SHOULDER ARTHROPLASTY;  Surgeon: [name], MD;  Location: [place];  Service: Orthopedics;  Laterality: Right;   RIGHT/LEFT HEART CATH AND CORONARY ANGIOGRAPHY N/A 09/06/2020   Procedure: RIGHT/LEFT HEART CATH AND CORONARY ANGIOGRAPHY;  Surgeon: [name], MD;  Location: [place] [facility];  Service: Cardiovascular;  Laterality: N/A;    ROTATOR CUFF REPAIR     right   SPINE SURGERY  1995   [place]   TOTAL ABDOMINAL HYSTERECTOMY W/ BILATERAL SALPINGOOPHORECTOMY  4854   UMBILICAL HERNIA REPAIR  Aug 11, 2015   Social History:  reports that she has never smoked. She has never used smokeless tobacco. She reports current drug use. She reports that she does not drink alcohol.  Allergies  Allergen Reactions   Demeclocycline Hives   Erythromycin Nausea And Vomiting and Other (See Comments)    Severe irritable bowel   Flagyl [Metronidazole] Nausea And Vomiting and Other (See Comments)    Severe irritable bowel   Glucophage [Metformin Hcl] Nausea And Vomiting and Other (See Comments)    "Sick" "I won't take anything that has metformin in it"   Tetracyclines & Related Hives and Rash   Diovan [Valsartan] Nausea Only        Sulfa Antibiotics Rash and Other (See Comments)    As child   Xanax [Alprazolam] Other (See Comments)    Unknown reaction   Family History  Problem Relation Age of Onset   Heart disease Father    Hypertension Father    Prostate cancer Father    Stroke Father    Osteoporosis Father    Stroke Mother    Depression Mother    Headache Mother    Heart disease Mother    Thyroid disease Mother    Hypertension Mother    Diabetes Daughter    Heart disease Daughter    Hypertension Daughter    Hypertension Son    Family history: Family history reviewed and not pertinent  Prior to Admission medications   Medication Sig Start Date End Date Taking? Authorizing Provider  albuterol (VENTOLIN HFA) 108 (90 Base) MCG/ACT inhaler Inhale 2 puffs into the lungs every 8 (eight) hours as needed for wheezing or shortness of breath. 08/07/19  Yes [name], MD  aspirin EC 81 MG tablet Take 1 tablet (81 mg total) by mouth daily. 01/05/20  Yes Fritzi Mandes, MD  atorvastatin (LIPITOR) 40 MG tablet Take 1 tablet (40 mg total) by mouth daily. 11/29/20  Yes [name], MD  clotrimazole-betamethasone (LOTRISONE)  cream Apply 1 application topically 2 (two) times daily as needed.   Yes [provider]  cyanocobalamin (,VITAMIN B-12,) 1000 MCG/ML injection Inject 1 mL (1,000 mcg total) into the muscle every 30 (thirty) days. Inject 1 ml (1000 mcg ) IM weekly x 4,  Then monthly thereafter 01/09/21  Yes [name], MD  dapagliflozin propanediol (FARXIGA) 10 MG TABS tablet Take 10 mg by mouth daily.   Yes [provider]  diphenhydrAMINE (BENADRYL) 25 MG tablet Take 50 mg by mouth daily as  needed for allergies.   Yes [provider]  diphenoxylate-atropine (LOMOTIL) 2.5-0.025 MG tablet Take 1 tablet by mouth 4 (four) times daily as needed for diarrhea or loose stools. 10/20/20  Yes [name], MD  fexofenadine (ALLEGRA) 180 MG tablet Take 180 mg by mouth daily.   Yes [provider]  insulin aspart (NOVOLOG FLEXPEN) 100 UNIT/ML FlexPen Inject 8-16 units up to 3 times daily before meals 01/19/21  Yes [name], MD  insulin glargine (LANTUS SOLOSTAR) 100 UNIT/ML Solostar Pen Inject 15 Units into the skin daily. Patient taking differently: Inject 15 Units into the skin at bedtime. 10/26/20  Yes [name], MD  Insulin Pen Needle (DROPLET PEN NEEDLES) 31G X 5 MM MISC USE ONE NEEDLE SUBCUTANEOUSLY AS DIRECTED (REMOVE AND DISCARD NEEDLE IN SHARPS CONTAINER IMMEDIATELY AFTER USE) 06/13/20  Yes [name], MD  KLOR-CON M20 20 MEQ tablet TAKE 1 TABLET BY MOUTH EVERY DAY 02/03/21  Yes End, Harrell Gave, MD  loperamide (IMODIUM) 2 MG capsule Take 2-4 mg by mouth as needed for diarrhea or loose stools.   Yes [provider]  metoprolol tartrate (LOPRESSOR) 25 MG tablet Take 1 tablet (25 mg total) by mouth 2 (two) times daily. 01/30/21 04/30/21 Yes Furth, Cadence H, PA-C  nitroGLYCERIN (NITROSTAT) 0.4 MG SL tablet Place 1 tablet (0.4 mg total) under the tongue every 5 (five) minutes as needed for chest pain. 09/23/20  Yes Theora Gianotti, NP  NONFORMULARY OR  COMPOUNDED [place]  Combination Pain Cream -  Baclofen 2%, Doxepin 5%, Gabapentin 6%, Topiramate 2%, Pentoxifylline 3% Apply 1-2 grams to affected area 3-4 times daily Qty. 120 gm 3 refills 10/14/17  Yes Gardiner Barefoot, DPM  nystatin (MYCOSTATIN/NYSTOP) powder Apply 1 g topically 4 (four) times daily as needed (rash).   Yes [provider]  predniSONE (DELTASONE) 10 MG tablet 6 tablets on Day 1 , then reduce by 1 tablet daily until gone 02/07/21  Yes Tullo, Aris Everts, MD  pregabalin (LYRICA) 50 MG capsule TAKE 1 CAPSULE BY MOUTH 2 TIMES DAILY. 01/02/21  Yes Kathrynn Ducking, MD  Semaglutide,0.25 or 0.5MG/DOS, (OZEMPIC, 0.25 OR 0.5 MG/DOSE,) 2 MG/1.5ML SOPN Inject 0.5 mg into the skin once a week. Patient taking differently: Inject 0.5 mg into the skin every Sunday. 01/19/21  Yes [name], MD  torsemide (DEMADEX) 10 MG tablet Take 1 tablet (10 mg total) by mouth daily. Patient taking differently: Take 10-20 mg by mouth See admin instructions. Take 1 tablet (31m) by mouth daily if <2 lbs gained and take 2 tablets (289m by mouth daily if >2 lbs gained 02/03/21  Yes Furth, Cadence H, PA-C  umeclidinium-vilanterol (ANORO ELLIPTA) 62.5-25 MCG/INH AEPB Inhale 1 puff into the lungs daily. 05/24/20  Yes TuCrecencio McMD  Continuous Blood Gluc Receiver (FREESTYLE LIBRE 2 READER) DEVI Use to check sugar at least TID 09/26/20   TuCrecencio McMD  hyoscyamine (ANASPAZ) 0.125 MG TBDP disintergrating tablet DISSOLVE UNDER TONGUE 15 MINUTES BEFORE MEALS Patient not taking: No sig reported 04/21/20   TuCrecencio McMD  traMADol (ULTRAM) 50 MG tablet Take 0.5 tablets (25 mg total) by mouth every 12 (twelve) hours as needed for severe pain. Patient not taking: No sig reported 08/21/17   ReElmarie ShileyMD   Physical Exam: Vitals:   02/08/21 1143 02/08/21 1331 02/08/21 1443 02/08/21 1906  BP: (!) 100/40 (!) 93/48 (!) 114/43 (!) 108/52  Pulse: 77 78 68 60  Resp: '16 16 16 18   ' Temp:    98.4 F (36.9 C)  TempSrc:      SpO2: 99% 99% 98% 100%  Weight:      Height:       Constitutional: appears age-appropriate, NAD, calm, comfortable Eyes: PERRL, lids and conjunctivae normal ENMT: Mucous membranes are moist. Posterior pharynx clear of any exudate or lesions. Age-appropriate dentition. Hearing appropriate Neck: normal, supple, no masses, no thyromegaly Respiratory: clear to auscultation bilaterally, no wheezing, no crackles. Normal respiratory effort. No accessory muscle use.  Cardiovascular: Regular rate and rhythm, no murmurs / rubs / gallops. No extremity edema. 2+ pedal pulses. No carotid bruits.  Abdomen: no tenderness, no masses palpated, no hepatosplenomegaly. Bowel sounds positive.  Musculoskeletal: no clubbing / cyanosis. No joint deformity upper and lower extremities. Good ROM, no contractures, no atrophy. Normal muscle tone.  Skin: no rashes, lesions, ulcers. No induration Neurologic: Sensation intact. Strength 5/5 in all 4.  Psychiatric: Normal judgment and insight. Alert and oriented x 3. Normal mood.   EKG: independently reviewed, showing sinus rhythm with rate of 66, qtc 467  Chest x-ray on Admission: I personally reviewed and I agree with radiologist reading as below.  CT Head Wo Contrast  Result Date: 02/08/2021 CLINICAL DATA:  Head trauma, minor (Age >= 65y) EXAM: CT HEAD WITHOUT CONTRAST TECHNIQUE: Contiguous axial images were obtained from the base of the skull through the vertex without intravenous contrast. COMPARISON:  Nov 20, 2020. FINDINGS: Brain: No evidence of acute large vascular territory infarction, hemorrhage, hydrocephalus, extra-axial collection or mass lesion/mass effect. Similar moderate patchy white matter hypoattenuation, nonspecific but most likely related to chronic microvascular ischemic disease. Mild for age atrophy. Dural ossification. Vascular: Calcific intracranial atherosclerosis. No hyperdense vessel identified. Skull: No  acute fracture.  Hyperostosis frontalis. Sinuses/Orbits: Visualized sinuses are largely clear with mild ethmoid air cell mucosal thickening. No acute orbital findings. Other: No mastoid effusions. IMPRESSION: 1. No evidence of acute intracranial abnormality. 2. Moderate chronic microvascular ischemic disease. Electronically Signed   By: Margaretha Sheffield MD   On: 02/08/2021 12:42   CT CERVICAL SPINE WO CONTRAST  Result Date: 02/08/2021 CLINICAL DATA:  Head trauma, minor (Age >= 65y).  Fall on Monday. EXAM: CT CERVICAL SPINE WITHOUT CONTRAST TECHNIQUE: Multidetector CT imaging of the cervical spine was performed without intravenous contrast. Multiplanar CT image reconstructions were also generated. COMPARISON:  Nov 20, 2020. FINDINGS: Alignment: Similar alignment. Similar mild straightening. Similar slight retrolisthesis of C5 on C6, likely degenerative given degenerative changes at this level. Skull base and vertebrae: No evidence of acute fracture. Vertebral body heights are similar to prior. Osteopenia. Soft tissues and spinal canal: No prevertebral fluid or swelling. No visible canal hematoma. The canal is obscured in the lower cervical and upper thoracic spine due to streak artifact. Disc levels: Craniocervical degenerative change. Multilevel degenerative disc disease, greatest at C5-C6 where there is disc height loss, endplate sclerosis and endplate spurring. Multilevel facet/uncovertebral hypertrophy. Upper chest: C concurrent CT chest/abdomen/pelvis for intrathoracic evaluation. IMPRESSION: 1. No evidence of acute fracture or traumatic malalignment. 2. Similar multilevel degenerative change, greatest at C5-C6. Electronically Signed   By: Margaretha Sheffield MD   On: 02/08/2021 12:47   CT CHEST ABDOMEN PELVIS W CONTRAST  Result Date: 02/08/2021 CLINICAL DATA:  [place] 2 days ago. Chest and abdominal pain. EXAM: CT CHEST, ABDOMEN, AND PELVIS WITH CONTRAST TECHNIQUE: Multidetector CT imaging of the chest,  abdomen and pelvis was performed following the standard protocol during bolus administration  of intravenous contrast. CONTRAST:  36m OMNIPAQUE IOHEXOL 350 MG/ML SOLN COMPARISON:  01/27/2021 FINDINGS: CT CHEST FINDINGS Cardiovascular: The heart is normal in size. No pericardial effusion. The aorta is normal in caliber. No dissection. Branch vessels are patent. Stable coronary artery calcifications. Stable mitral valve annular calcifications. Mediastinum/Nodes: No mediastinal or hilar mass or adenopathy or hematoma. The esophagus is grossly normal. Lungs/Pleura: No acute pulmonary findings. No pulmonary contusion, pleural effusion or pneumothorax. No worrisome pulmonary lesions. Musculoskeletal: No chest wall contusions or hematomas. The bony thorax is intact. A total right shoulder arthroplasty is noted. CT ABDOMEN PELVIS FINDINGS Hepatobiliary: No acute hepatic injury or perihepatic fluid. No worrisome hepatic lesions or intrahepatic biliary dilatation. The gallbladder is surgically absent. No common bile duct dilatation. Pancreas: No mass, inflammation or ductal dilatation. No evidence of acute injury. No peripancreatic fluid collections. Spleen: Normal size. No acute injury or perisplenic fluid. Adrenals/Urinary Tract: Stable left adrenal gland nodules consistent with benign adenomas. The kidneys are unremarkable. No renal lesions or hydronephrosis. No acute renal injury or perinephric fluid collection. Stomach/Bowel: Surgical changes from gastric bypass surgery. No complicating features are identified. The small bowel and colon are grossly normal. Vascular/Lymphatic: Scattered vascular calcifications but no aneurysm or dissection. The branch vessels are patent. The major venous structures are patent. No mesenteric or retroperitoneal mass, adenopathy or hematoma. Reproductive: The uterus is surgically absent. Both ovaries are still present and appear normal. Other: No free abdominal/pelvic fluid or free air.  Calcification along the midline of the lower abdominal wall likely related to prior surgery. Musculoskeletal: Both hips are normally located. No hip or pelvic fractures are identified. Pubic symphysis and SI joints are maintained. Moderate degenerative changes. The lumbar vertebral bodies are normally aligned. No acute fracture. IMPRESSION: 1. No acute findings in the chest, abdomen or pelvis. 2. Stable left adrenal gland adenoma. 3. Surgical changes from gastric bypass surgery. No complicating features are identified. 4. Status post cholecystectomy. No biliary dilatation. Electronically Signed   By: PMarijo SanesM.D.   On: 02/08/2021 13:00   CT L-SPINE NO CHARGE  Result Date: 02/08/2021 CLINICAL DATA:  Abdominal trauma, initial encounter S39.91XA (ICD-10-CM). Fall on Monday with low back pain and buttock pain. EXAM: CT LUMBAR SPINE WITHOUT CONTRAST TECHNIQUE: Multidetector CT imaging of the lumbar spine was performed without intravenous contrast administration. Multiplanar CT image reconstructions were also generated. COMPARISON:  Lumbar radiographs September 24, 2012. CT abdomen/pelvis January 27, 2021. FINDINGS: Segmentation: 5 non rib-bearing lumbar vertebral bodies. Alignment: Similar alignment to recent CT abdomen/pelvis. Straightening of the normal lumbar lordosis without substantial sagittal subluxation. Mild broad dextrocurvature. Vertebrae: Vertebral body heights are maintained and similar to recent CT abdomen/pelvis. No evidence of acute fracture. Benign T12 vertebral venous malformation osteopenia. Paraspinal and other soft tissues: Please see concurrent CT chest abdomen pelvis for characterization of the abdomen/pelvis. Similar dystrophic calcifications in the posterior paraspinal soft tissues. Disc levels: Moderate to severe multilevel degenerative change. This includes marked disc height loss at L3-L4 with partial fusion across the disc space. Additionally, there is moderate to severe disc height loss  at L4-L5 and endplate sclerosis with vacuum disc phenomenon at L2-L3. posterior disc bulges at multiple levels, likely greatest at L2-L3. Prominent spinous processes throughout the lumbar spine, which can be seen with Baastrup disease. Multilevel facet/uncovertebral hypertrophy with varying degrees of neural foraminal stenosis, likely greatest on the left at L3-L4. Other: Partially imaged right sacral stimulator device. Aorto bi-iliac calcific atherosclerosis. IMPRESSION: 1. No evidence of acute fracture or traumatic  malalignment. 2. 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 MRI could better characterize the canal and foramina if clinically indicated. 3. Osteopenia. Electronically Signed   By: Margaretha Sheffield MD   On: 02/08/2021 12:38    Labs on Admission: I have personally reviewed following labs  CBC: Recent Labs  Lab 02/08/21 1054  WBC 14.2*  HGB 15.2*  HCT 44.9  MCV 93.9  PLT 785   Basic Metabolic Panel: Recent Labs  Lab 02/03/21 1229 02/08/21 1054  NA 136 139  K 3.1* 4.4  CL 91* 95*  CO2 32 29  GLUCOSE 184* 191*  BUN 47* 27*  CREATININE 1.80* 1.45*  CALCIUM 8.8* 8.8*   GFR: Estimated Creatinine Clearance: 41.4 mL/min (A) (by C-G formula based on SCr of 1.45 mg/dL (H)).  Liver Function Tests: Recent Labs  Lab 02/08/21 1054  AST 42*  ALT 20  ALKPHOS 102  BILITOT 1.9*  PROT 7.3  ALBUMIN 3.3*   Thyroid Function Tests: Recent Labs    02/08/21 1855  TSH 2.209   Anemia Panel: No results for input(s): VITAMINB12, FOLATE, FERRITIN, TIBC, IRON, RETICCTPCT in the last 72 hours.  Urine analysis:    Component Value Date/Time   COLORURINE YELLOW (A) 02/08/2021 1323   APPEARANCEUR CLEAR (A) 02/08/2021 1323   APPEARANCEUR Clear 11/11/2014 1602   LABSPEC 1.034 (H) 02/08/2021 1323   LABSPEC 1.006 06/21/2014 1623   PHURINE 7.0 02/08/2021 1323   GLUCOSEU 150 (A) 02/08/2021 1323   GLUCOSEU >=1000 (A)  11/15/2020 1622   HGBUR NEGATIVE 02/08/2021 1323   BILIRUBINUR NEGATIVE 02/08/2021 1323   BILIRUBINUR 2+ 04/25/2018 1523   BILIRUBINUR Negative 11/11/2014 1602   BILIRUBINUR Negative 06/21/2014 1623   KETONESUR 20 (A) 02/08/2021 1323   PROTEINUR NEGATIVE 02/08/2021 1323   UROBILINOGEN 0.2 11/15/2020 1622   NITRITE NEGATIVE 02/08/2021 1323   LEUKOCYTESUR NEGATIVE 02/08/2021 1323   LEUKOCYTESUR Negative 06/21/2014 1623   Dr. Tobie Poet [facility]  If 7PM-7AM, please contact overnight-coverage provider If 7AM-7PM, please contact day coverage provider [email or web address]  02/08/2021, 8:48 PM

## 2021-02-08 NOTE — Telephone Encounter (Signed)
Providing access nurse documentation.      

## 2021-02-08 NOTE — ED Notes (Signed)
Kara RN aware of assigned bed 

## 2021-02-08 NOTE — ED Notes (Signed)
Patient transported to CT 

## 2021-02-09 ENCOUNTER — Encounter: Payer: Self-pay | Admitting: Internal Medicine

## 2021-02-09 ENCOUNTER — Ambulatory Visit: Payer: Medicare Other | Admitting: Pharmacist

## 2021-02-09 DIAGNOSIS — N183 Chronic kidney disease, stage 3 unspecified: Secondary | ICD-10-CM | POA: Diagnosis not present

## 2021-02-09 DIAGNOSIS — E1140 Type 2 diabetes mellitus with diabetic neuropathy, unspecified: Secondary | ICD-10-CM

## 2021-02-09 DIAGNOSIS — E11610 Type 2 diabetes mellitus with diabetic neuropathic arthropathy: Secondary | ICD-10-CM | POA: Diagnosis not present

## 2021-02-09 DIAGNOSIS — I2510 Atherosclerotic heart disease of native coronary artery without angina pectoris: Secondary | ICD-10-CM | POA: Diagnosis not present

## 2021-02-09 DIAGNOSIS — I9589 Other hypotension: Secondary | ICD-10-CM | POA: Diagnosis not present

## 2021-02-09 DIAGNOSIS — I10 Essential (primary) hypertension: Secondary | ICD-10-CM

## 2021-02-09 DIAGNOSIS — E1122 Type 2 diabetes mellitus with diabetic chronic kidney disease: Secondary | ICD-10-CM | POA: Diagnosis not present

## 2021-02-09 DIAGNOSIS — M4802 Spinal stenosis, cervical region: Secondary | ICD-10-CM | POA: Diagnosis not present

## 2021-02-09 DIAGNOSIS — G2581 Restless legs syndrome: Secondary | ICD-10-CM

## 2021-02-09 LAB — BASIC METABOLIC PANEL
Anion gap: 12 (ref 5–15)
Anion gap: 9 (ref 5–15)
BUN: 25 mg/dL — ABNORMAL HIGH (ref 8–23)
BUN: 27 mg/dL — ABNORMAL HIGH (ref 8–23)
CO2: 27 mmol/L (ref 22–32)
CO2: 31 mmol/L (ref 22–32)
Calcium: 8.4 mg/dL — ABNORMAL LOW (ref 8.9–10.3)
Calcium: 8.5 mg/dL — ABNORMAL LOW (ref 8.9–10.3)
Chloride: 100 mmol/L (ref 98–111)
Chloride: 102 mmol/L (ref 98–111)
Creatinine, Ser: 1.48 mg/dL — ABNORMAL HIGH (ref 0.44–1.00)
Creatinine, Ser: 1.47 mg/dL — ABNORMAL HIGH (ref 0.44–1.00)
GFR, Estimated: 37 mL/min — ABNORMAL LOW (ref >60)
GFR, Estimated: 37 mL/min — ABNORMAL LOW (ref >60)
Glucose, Bld: 131 mg/dL — ABNORMAL HIGH (ref 70–99)
Glucose, Bld: 176 mg/dL — ABNORMAL HIGH (ref 70–99)
Potassium: 3.1 mmol/L — ABNORMAL LOW (ref 3.5–5.1)
Potassium: 3.6 mmol/L (ref 3.5–5.1)
Sodium: 139 mmol/L (ref 135–145)
Sodium: 142 mmol/L (ref 135–145)

## 2021-02-09 LAB — CBC
HCT: 39.9 % (ref 36.0–46.0)
Hemoglobin: 13.2 g/dL (ref 12.0–15.0)
MCH: 31.4 pg (ref 26.0–34.0)
MCHC: 33.1 g/dL (ref 30.0–36.0)
MCV: 95 fL (ref 80.0–100.0)
Platelets: 238 10*3/uL (ref 150–400)
RBC: 4.2 MIL/uL (ref 3.87–5.11)
RDW: 14.4 % (ref 11.5–15.5)
WBC: 9.2 10*3/uL (ref 4.0–10.5)
nRBC: 0 % (ref 0.0–0.2)

## 2021-02-09 LAB — MAGNESIUM: Magnesium: 1.8 mg/dL (ref 1.7–2.4)

## 2021-02-09 LAB — GLUCOSE, CAPILLARY
Glucose-Capillary: 143 mg/dL — ABNORMAL HIGH (ref 70–99)
Glucose-Capillary: 192 mg/dL — ABNORMAL HIGH (ref 70–99)
Glucose-Capillary: 153 mg/dL — ABNORMAL HIGH (ref 70–99)
Glucose-Capillary: 218 mg/dL — ABNORMAL HIGH (ref 70–99)

## 2021-02-09 MED ORDER — POTASSIUM CHLORIDE CRYS ER 20 MEQ PO TBCR
40.0000 meq | EXTENDED_RELEASE_TABLET | Freq: Once | ORAL | Status: AC
  Administered 2021-02-09: 40 meq via ORAL
  Filled 2021-02-09: qty 2

## 2021-02-09 MED ORDER — POTASSIUM CHLORIDE CRYS ER 20 MEQ PO TBCR
20.0000 meq | EXTENDED_RELEASE_TABLET | Freq: Every day | ORAL | Status: DC
  Administered 2021-02-10: 20 meq via ORAL
  Filled 2021-02-09: qty 1

## 2021-02-09 NOTE — TOC Progression Note (Signed)
Transition of Care [facility]) - Progression Note    Patient Details  Name: Denise Sharp MRN: [id number] Date of Birth: 1946/07/05  Transition of Care [facility]) CM/SW [place], RN Phone Number: 02/09/2021, 2:30 PM  Clinical Narrative:  Patient lives at home with her husband, who is not able to take care of her or assist her when she falls.  Daughter and son would like her to have constant supervision.  She is amenable to home health and obtaining a personal care aide to assist her in the house and transport her for MD/other appointments.  Patient has been a client of [facility] in the past, and is amenable to speak to coordinator, Floydene Flock about her needs on discharge.  Corene Cornea contacted, will speak with patient about the services for which she is eligible and which [facility] can provide  Orders will be for PT.OT. Nurse, Nurse Aide and Social Work.    TOC explained that we cannot refer PCA agencies directly, but Personal Care Aides are able to assist with needs in the household and they are typically private pay funded.  Patient stated she is aware of this and will speak with [facility] first and then determine her other needs.  TOC contact information given, TOC to follow to discharge.          Expected Discharge Plan and Services                                                 Social Determinants of Health (SDOH) Interventions    Readmission Risk Interventions No flowsheet data found.

## 2021-02-09 NOTE — Evaluation (Signed)
Occupational Therapy Evaluation Patient Details Name: Denise Sharp MRN: [id number] DOB: 1945/11/26 Today's Date: 02/09/2021    History of Present Illness Pt admitted for cervical stenosis of spine with complaints of pain in buttocks s/p fall in shower. HIstory includes multiple falls, CAD s/p PCI to LAD, obestity, HTN, HLD, and DM.   Clinical Impression   Pt was seen for OT evaluation this date. Prior to hospital admission, pt was having increasing falls 2/2 decreased balance and weakness. Pt lives with her spouse and has 2 ramped entrances. Currently pt demonstrates impairments as described below (See OT problem list) which functionally limit her ability to perform ADL/self-care tasks. Pt currently requires at least MOD A for LB ADL tasks. Pt declined EOB/OOB ADL 2/2 severe R buttock pain with any movement. Pt endorses also just working with PT and ambulating in the hallway. Pt/spouse educated in AE for toileting, showering. Will provide handout. Pt would benefit from skilled OT services to address noted impairments and functional limitations (see below for any additional details) in order to maximize safety and independence while minimizing falls risk and caregiver burden. Upon hospital discharge, recommend HHOT to maximize pt safety and return to functional independence during meaningful occupations of daily life.     Follow Up Recommendations  Home health OT;Other (comment) (supervision for OOB/ADL)    Equipment Recommendations  Other (comment) (reacher, toileting aide, anti skip tape for shower floor)    Recommendations for Other Services       Precautions / Restrictions Precautions Precautions: Fall Restrictions Weight Bearing Restrictions: No      Mobility Bed Mobility      General bed mobility comments: pt declined 2/2 pain    Transfers          General transfer comment: pt declined 2/2 pain    Balance                              ADL either  performed or assessed with clinical judgement   ADL Overall ADL's : Needs assistance/impaired                                       General ADL Comments: Anticipate pt to require MOD A+ for LB ADL tasks 2/2 R buttock pain     Vision         Perception     Praxis      Pertinent Vitals/Pain Pain Assessment: 0-10 Pain Score: 10-Worst pain ever Faces Pain Scale: Hurts even more Pain Location: R buttock with any movement; 2/10 at rest wiht no movement Pain Descriptors / Indicators: Aching;Cramping Pain Intervention(s): Limited activity within patient's tolerance;Monitored during session;Premedicated before session;Repositioned     Hand Dominance Right   Extremity/Trunk Assessment Upper Extremity Assessment Upper Extremity Assessment: Overall WFL for tasks assessed (grossly WFL, pt endorses decreased bilat shoulder ROM 2/2 previous injury and surgery, but is able to don overhead shirts wihtout assist)   Lower Extremity Assessment Lower Extremity Assessment: Generalized weakness (B LE grossly 4/5; chronic B LE decreased sensation distal to knees)       Communication Communication Communication: No difficulties   Cognition Arousal/Alertness: Awake/alert Behavior During Therapy: WFL for tasks assessed/performed Overall Cognitive Status: Within Functional Limits for tasks assessed  General Comments: hyperverbal, cues to redirect   General Comments       Exercises Other Exercises Other Exercises: Pt/spouse educated in AE for toileting to improve independence   Shoulder Instructions      Home Living Family/patient expects to be discharged to:: Private residence Living Arrangements: Spouse/significant other Available Help at Discharge: Family;Available 24 hours/day Type of Home: House Home Access: Ramped entrance     Home Layout: One level     Bathroom Shower/Tub: Walk-in shower         Home  Equipment: Environmental consultant - 2 wheels;Walker - 4 wheels;Cane - single point;Adaptive equipment Adaptive Equipment: Other (Comment) Additional Comments: has toileting aide      Prior Functioning/Environment Level of Independence: Independent with assistive device(s)        Comments: furniture ambulates within home and uses rollator for community distances. Reports multiple falls while on step and has difficulty with depth perception and foot clearance.        OT Problem List: Decreased strength;Pain;Obesity;Decreased knowledge of use of DME or AE      OT Treatment/Interventions: Self-care/ADL training;Therapeutic exercise;Therapeutic activities;DME and/or AE instruction;Patient/family education    OT Goals(Current goals can be found in the care plan section) Acute Rehab OT Goals Patient Stated Goal: to stop having falls OT Goal Formulation: With patient/family Time For Goal Achievement: 02/23/21 Potential to Achieve Goals: Good ADL Goals Pt Will Transfer to Toilet: with modified independence;ambulating (c LRAD PRN) Pt Will Perform Toileting - Clothing Manipulation and hygiene: with modified independence;with adaptive equipment;sit to/from stand Additional ADL Goal #1: Pt will verbalize plan to implement at least 2 learned falls prevention strategies.  OT Frequency: Min 1X/week   Barriers to D/C:            Co-evaluation              AM-PAC OT "6 Clicks" Daily Activity     Outcome Measure Help from another person eating meals?: None Help from another person taking care of personal grooming?: None Help from another person toileting, which includes using toliet, bedpan, or urinal?: A Lot Help from another person bathing (including washing, rinsing, drying)?: A Lot Help from another person to put on and taking off regular upper body clothing?: None Help from another person to put on and taking off regular lower body clothing?: A Lot 6 Click Score: 18   End of Session     Activity Tolerance: Patient limited by pain Patient left: in bed;with call bell/phone within reach;with bed alarm set;with family/visitor present  OT Visit Diagnosis: Other abnormalities of gait and mobility (R26.89);Pain Pain - Right/Left: Right Pain - part of body: Hip (buttock)                Time: 6301-6010 OT Time Calculation (min): 34 min Charges:  OT General Charges $OT Visit: 1 Visit OT Evaluation $OT Eval Moderate Complexity: 1 Mod OT Treatments $Self Care/Home Management : 23-37 mins  Hanley Hays, MPH, MS, OTR/L ascom [phone] 02/09/21, 1:14 PM

## 2021-02-09 NOTE — Patient Instructions (Signed)
Visit Information  PATIENT GOALS:  Goals Addressed               This Visit's Progress     Patient Stated     Medication Monitoring (pt-stated)        Patient Goals/Self-Care Activities Over the next 90 days, patient will:  - take medications as prescribed check blood glucose at least three times daily using CGM, document, and provide at future appointments        Patient verbalizes understanding of instructions provided today and agrees to view in [place].   Plan: Telephone follow up appointment with care management team member scheduled for:  6 weeks as previously scheduled  Catie Darnelle Maffucci, PharmD, [place], [place] Clinical Pharmacist [employer] [facility] [employer] [phone]

## 2021-02-09 NOTE — Progress Notes (Signed)
[place] at [place] NAME: Denise Sharp    MR#:  [id number]  PCP: [name], MD  DATE OF BIRTH:  05-06-1946  SUBJECTIVE:  CHIEF COMPLAINT:  No chief complaint on file. Explains her home situation and ongoing difficulty with ambulation and multiple falls.  Husband at bedside. REVIEW OF SYSTEMS:  Review of Systems  Constitutional:  Negative for diaphoresis, fever, malaise/fatigue and weight loss.  HENT:  Negative for ear discharge, ear pain, hearing loss, nosebleeds, sore throat and tinnitus.   Eyes:  Negative for blurred vision and pain.  Respiratory:  Negative for cough, hemoptysis, shortness of breath and wheezing.   Cardiovascular:  Negative for chest pain, palpitations, orthopnea and leg swelling.  Gastrointestinal:  Negative for abdominal pain, blood in stool, constipation, diarrhea, heartburn, nausea and vomiting.  Genitourinary:  Negative for dysuria, frequency and urgency.  Musculoskeletal:  Positive for falls. Negative for back pain and myalgias.  Skin:  Negative for itching and rash.  Neurological:  Negative for dizziness, tingling, tremors, focal weakness, seizures, weakness and headaches.  Psychiatric/Behavioral:  Negative for depression. The patient is not nervous/anxious.   DRUG ALLERGIES:   Allergies  Allergen Reactions   Demeclocycline Hives   Erythromycin Nausea And Vomiting and Other (See Comments)    Severe irritable bowel   Flagyl [Metronidazole] Nausea And Vomiting and Other (See Comments)    Severe irritable bowel   Glucophage [Metformin Hcl] Nausea And Vomiting and Other (See Comments)    "Sick" "I won't take anything that has metformin in it"   Tetracyclines & Related Hives and Rash   Diovan [Valsartan] Nausea Only        Sulfa Antibiotics Rash and Other (See Comments)    As child   Xanax [Alprazolam] Other (See Comments)    Unknown reaction   VITALS:  Blood pressure (!) 115/53, pulse 69, temperature 97.9 F  (36.6 C), resp. rate 18, height 5\' 4"  (1.626 m), weight 112.2 kg, SpO2 99 %. PHYSICAL EXAMINATION:  Physical Exam 75 year old female lying in the bed comfortably without any acute distress Eyes pupil equal round reactive to light and accommodation no scleral icterus Lungs clear to auscultation bilaterally, no wheezing rales rhonchi crepitation Cardiovascular S1-S2 normal, no murmur rales or gallop Abdomen soft, benign Neuro: Alert and oriented, nonfocal Skin: Lumbar ecchymosis present status post fall Psychiatry: Normal judgment and insight, normal mood LABORATORY PANEL:  Female CBC Recent Labs  Lab 02/09/21 0435  WBC 9.2  HGB 13.2  HCT 39.9  PLT 238   ------------------------------------------------------------------------------------------------------------------ Chemistries  Recent Labs  Lab 02/08/21 1054 02/09/21 0435 02/09/21 1424  NA 139   < > 142  K 4.4   < > 3.6  CL 95*   < > 102  CO2 29   < > 31  GLUCOSE 191*   < > 176*  BUN 27*   < > 27*  CREATININE 1.45*   < > 1.47*  CALCIUM 8.8*   < > 8.5*  MG  --   --  1.8  AST 42*  --   --   ALT 20  --   --   ALKPHOS 102  --   --   BILITOT 1.9*  --   --    < > = values in this interval not displayed.   MEDICATIONS:  Scheduled Meds:  aspirin EC  81 mg Oral Daily   atorvastatin  40 mg Oral Daily   dapagliflozin  propanediol  10 mg Oral Daily   heparin  5,000 Units Subcutaneous Q8H   insulin aspart  0-20 Units Subcutaneous TID WC   insulin aspart  0-5 Units Subcutaneous QHS   insulin glargine-yfgn  15 Units Subcutaneous QHS   lidocaine  2 patch Transdermal Q24H   metoprolol tartrate  25 mg Oral BID   [START ON 02/10/2021] potassium chloride  20 mEq Oral Daily   pregabalin  50 mg Oral BID   umeclidinium-vilanterol  1 puff Inhalation Daily   Continuous Infusions: RADIOLOGY:  No results found. ASSESSMENT AND PLAN:  75 y.o. 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 ([place])   GAD (generalized anxiety disorder)   Bladder pain   Coronary artery disease involving native coronary artery of native heart without angina pectoris   CKD stage 3 due to type 2 diabetes mellitus ([place])  Frequent falls: Multifactorial including chronic pain, peripheral neuropathy, morbid obesity, polypharmacy -Patient has had extensive work-up including CT head, cervical spine, chest, abdomen and pelvis, lumbar spine essentially not showing any acute pathology -PT/OT recommends home health which are being set up by [facility] for discharge tomorrow -I have requested patient/husband to set up nursing aide or private caregivers for supervision and help at home -Fall precautions -Her pain is better controlled so we will hold off Decadron at this time  #Hypokalemia Repleted and resolved  # Hypertension-continue metoprolol tartrate 25 mg p.o. twice daily  # Obesity-recommended weight loss safely outpatient - Status post gastric bypass surgery   # Obstructive sleep apnea-CPAP nightly ordered  # Diabetic polyneuropathy-continue Lyrica 50 mg PO BID   # Insulin-dependent diabetes mellitus - Continue home long-acting 15 units subcutaneous nightly - Farxiga 10 mg p.o. daily - Insulin SSI with at bedtime coverage ordered -We will hold off Decadron at this time   #Generalized anxiety disorder  # Coronary artery disease-continue aspirin 81 mg daily, atorvastatin 40 mg nightly, metoprolol tartrate 25 mg twice daily - Patient is asymptomatic from cardiac standpoint - nitroglycerin 0.4 mg sublingual every 5 minutes  as needed for chest pain   # Polypharmacy putting patient at risk for frequent falls - Admitting physician has done extensive education with daughter Margreta Journey that patient should not be taking Benadryl if can be avoided - Recommended Allegra nonsedating and/or Claritin nonsedating over-the-counter for allergies - Holding home tramadol for now - Also discussed that the combination of Lyrica, tramadol, Benadryl will increase patient's risk of falling and respiratory distress -Recommend outpatient pain management and neurology follow-up   # Lumbar ecchymosis-secondary to trauma in shower, present on admission, approximately 20 to 25 cm in length and 7 to 10 cm in diameter that is dark purple  Patient requesting overnight monitoring and discharge tomorrow so that they can set up home health aide and private caregivers.  I have recommended patient to follow-up with her PCP, cardiology and [facility] neurology as an outpatient  Body mass index is 42.46 kg/m.  Net IO Since Admission: -360 mL [02/09/21 1553]      LOS: 0 days   Consultants: PT, OT, TOC    Antibiotics: None, received a dose of  vancomycin in the ED  Status is: Observation  The patient remains OBS appropriate and will d/c before 2 midnights.  Dispo: The patient is from: Home              Anticipated d/c is to: Home              Patient currently is not medically stable to d/c.   Difficult to place patient No    DVT prophylaxis:       heparin injection 5,000 Units Start: 02/08/21 1615 Place TED hose Start: 02/08/21 1601     Family Communication: Updated husband at bedside on 7/28   All the records are reviewed and case discussed with Nursing and TOC team. Management plans discussed with the patient, family and they are in agreement.  CODE STATUS: Full Code Level of care: Med-Surg  TOTAL TIME TAKING CARE OF THIS PATIENT: 35 minutes.   More than 50% of the time was spent in counseling/coordination of care:  YES  POSSIBLE D/C IN 1 DAYS, DEPENDING ON CLINICAL CONDITION.   Max Sane M.D on 02/09/2021 at 3:53 PM  [facility]   CC: Primary care physician; [name], MD  Note: This dictation was prepared with Dragon dictation along with smaller phrase technology. Any transcriptional errors that result from this process are unintentional.

## 2021-02-09 NOTE — Progress Notes (Addendum)
Occupational Therapy Treatment Patient Details Name: Denise Sharp MRN: [id number] DOB: February 16, 1946 Today's Date: 02/09/2021    History of present illness Pt admitted for cervical stenosis of spine with complaints of pain in buttocks s/p fall in shower. HIstory includes multiple falls, CAD s/p PCI to LAD, obestity, HTN, HLD, and DM.   OT comments  Pt seen for brief OT tx to educate on AE/DME for toileting and showering. Spouse present. Handout provided with various toileting aides available and handheld bidet options to support modified independence with toileting hygiene and pericare. Antiskid tape in handout as well for the shower floor to minimize falls risk. Pt/spouse verbalized understanding. Appreciative of handout to support decision making and plan to purchase. Pt continues to benefit from skilled OT services. Continue to recommend HHOT at discharge.     Follow Up Recommendations  Home health OT;Other (comment) (supervision OOB/ADL)    Equipment Recommendations  Other (comment) (reacher, toileting aide, anti skid tape for shower)    Recommendations for Other Services      Precautions / Restrictions Precautions Precautions: Fall Restrictions Weight Bearing Restrictions: No       Mobility Bed Mobility      General bed mobility comments: pt declined 2/2 pain    Transfers          General transfer comment: pt declined 2/2 pain    Balance                              ADL either performed or assessed with clinical judgement   ADL Overall ADL's : Needs assistance/impaired                                       General ADL Comments: Anticipate pt to require MOD A+ for LB ADL tasks 2/2 R buttock pain     Vision       Perception     Praxis      Cognition Arousal/Alertness: Awake/alert Behavior During Therapy: WFL for tasks assessed/performed Overall Cognitive Status: Within Functional Limits for tasks assessed                                  General Comments: hyperverbal, cues to redirect        Exercises Other Exercises Other Exercises: Pt/spouse provided with handout with various toileting aides available and handheld bidet options to support modified independence with toileting hygiene and pericare. Antiskid tape in handout as well for the shower floor to minimize falls risk   Shoulder Instructions       General Comments      Pertinent Vitals/ Pain       Pain Assessment: 0-10 Pain Score: 10-Worst pain ever Faces Pain Scale: Hurts even more Pain Location: R buttock with any movement; 2/10 at rest wiht no movement Pain Descriptors / Indicators: Aching;Cramping Pain Intervention(s): Limited activity within patient's tolerance;Monitored during session;Premedicated before session;Repositioned  Home Living Family/patient expects to be discharged to:: Private residence Living Arrangements: Spouse/significant other Available Help at Discharge: Family;Available 24 hours/day Type of Home: House Home Access: Ramped entrance     Home Layout: One level     Bathroom Shower/Tub: Walk-in shower         Home Equipment: Environmental consultant - 2 wheels;Walker - 4 wheels;Cane - single point;Adaptive equipment  Adaptive Equipment: Other (Comment) Additional Comments: has toileting aide      Prior Functioning/Environment Level of Independence: Independent with assistive device(s)        Comments: furniture ambulates within home and uses rollator for community distances. Reports multiple falls while on step and has difficulty with depth perception and foot clearance.   Frequency  Min 1X/week        Progress Toward Goals  OT Goals(current goals can now be found in the care plan section)  Progress towards OT goals: Progressing toward goals  Acute Rehab OT Goals Patient Stated Goal: to stop having falls OT Goal Formulation: With patient/family Time For Goal Achievement: 02/23/21 Potential  to Achieve Goals: Good ADL Goals Pt Will Transfer to Toilet: with modified independence;ambulating (c LRAD PRN) Pt Will Perform Toileting - Clothing Manipulation and hygiene: with modified independence;with adaptive equipment;sit to/from stand Additional ADL Goal #1: Pt will verbalize plan to implement at least 2 learned falls prevention strategies.  Plan Discharge plan remains appropriate;Frequency remains appropriate    Co-evaluation                 AM-PAC OT "6 Clicks" Daily Activity     Outcome Measure   Help from another person eating meals?: None Help from another person taking care of personal grooming?: None Help from another person toileting, which includes using toliet, bedpan, or urinal?: A Lot Help from another person bathing (including washing, rinsing, drying)?: A Lot Help from another person to put on and taking off regular upper body clothing?: None Help from another person to put on and taking off regular lower body clothing?: A Lot 6 Click Score: 18    End of Session    OT Visit Diagnosis: Other abnormalities of gait and mobility (R26.89);Pain Pain - Right/Left: Right Pain - part of body: Hip (buttock)   Activity Tolerance Patient tolerated treatment well   Patient Left in bed;with call bell/phone within reach;with bed alarm set;with family/visitor present   Nurse Communication          Time: 2202-5427 OT Time Calculation (min): 8 min  Charges: OT General Charges $OT Visit: 1 Visit OT Treatments $Self Care/Home Management : 8-22 mins  Hanley Hays, MPH, MS, OTR/L ascom [phone] 02/09/21, 1:49 PM

## 2021-02-09 NOTE — Chronic Care Management (AMB) (Signed)
Chronic Care Management Pharmacy Note  02/09/2021 Name:  Denise Sharp MRN:  [id number] DOB:  [birth year]  Subjective: Denise Sharp is an 75 y.o. year old female who is a primary patient of [name], Aris Everts, MD.  The CCM team was consulted for assistance with disease management and care coordination needs.    Care coordination  for  device access  in response to provider referral for pharmacy case management and/or care coordination services.   Consent to Services:  The patient was given information about Chronic Care Management services, agreed to services, and gave verbal consent prior to initiation of services.  Please see initial visit note for detailed documentation.   Patient Care Team: [name], MD as PCP - General (Internal Medicine) End, Harrell Gave, MD as PCP - Cardiology (Cardiology) Chiropractic, Freddi Che as Referring Physician De Hollingshead, RPH-CPP (Pharmacist)   Objective:  Lab Results  Component Value Date   CREATININE 1.48 (H) 02/09/2021   CREATININE 1.45 (H) 02/08/2021   CREATININE 1.80 (H) 02/03/2021    Lab Results  Component Value Date   HGBA1C 8.2 (A) 11/15/2020   Last diabetic Eye exam:  Lab Results  Component Value Date/Time   HMDIABEYEEXA No Retinopathy 10/15/2016 12:00 AM    Last diabetic Foot exam:  Lab Results  Component Value Date/Time   HMDIABFOOTEX abnormal 06/23/2014 12:00 AM        Component Value Date/Time   CHOL 134 05/24/2020 1532   CHOL 155 02/27/2014 0420   TRIG 128.0 05/24/2020 1532   TRIG 121 02/27/2014 0420   HDL 60.40 05/24/2020 1532   HDL 43 02/27/2014 0420   CHOLHDL 2 05/24/2020 1532   VLDL 25.6 05/24/2020 1532   VLDL 24 02/27/2014 0420   LDLCALC 48 05/24/2020 1532   LDLCALC 88 02/27/2014 0420   LDLDIRECT 111.0 06/16/2015 0936    Hepatic Function Latest Ref Rng & Units 02/08/2021 01/27/2021 07/17/2020  Total Protein 6.5 - 8.1 g/dL 7.3 7.5 7.6  Albumin 3.5 - 5.0 g/dL 3.3(L) 3.6 3.5  AST 15 - 41  U/L 42(H) 27 18  ALT 0 - 44 U/L _0 Alk Phosphatase 38 - 126 U/L 102 111 129(H)  Total Bilirubin 0.3 - 1.2 mg/dL 1.9(H) 0.7 0.5  Bilirubin, Direct 0.0 - 0.2 mg/dL - <0.1 -    Lab Results  Component Value Date/Time   TSH 2.209 02/08/2021 06:55 PM   TSH 3.695 07/17/2020 11:50 AM   TSH 3.50 01/01/2019 01:45 PM   TSH 4.400 06/16/2015 09:36 AM   FREET4 1.10 05/03/2015 04:20 PM    CBC Latest Ref Rng & Units 02/09/2021 02/08/2021 01/27/2021  WBC 4.0 - 10.5 K/uL 9.2 14.2(H) 11.5(H)  Hemoglobin 12.0 - 15.0 g/dL 13.2 15.2(H) 14.8  Hematocrit 36.0 - 46.0 % 39.9 44.9 45.0  Platelets 150 - 400 K/uL 238 279 286    Lab Results  Component Value Date/Time   VD25OH 21.65 (L) 06/04/2017 02:12 PM   VD25OH 23.44 (L) 11/19/2016 02:31 PM    Clinical ASCVD: Yes  The 10-year ASCVD risk score Mikey Bussing DC Jr., et al., 2013) is: 30.8%   Values used to calculate the score:     Age: 43 years     Sex: Female     Is Non-Hispanic African American: No     Diabetic: Yes     Tobacco smoker: No     Systolic Blood Pressure: 503 mmHg     Is BP treated: Yes  HDL Cholesterol: 60.4 mg/dL     Total Cholesterol: 134 mg/dL    Social History   Tobacco Use  Smoking Status Never  Smokeless Tobacco Never   BP Readings from Last 3 Encounters:  02/09/21 (!) 117/57  01/30/21 110/70  01/27/21 (!) 103/52   Pulse Readings from Last 3 Encounters:  02/09/21 64  01/30/21 94  01/27/21 77   Wt Readings from Last 3 Encounters:  02/08/21 247 lb 5.7 oz (112.2 kg)  01/30/21 247 lb 8 oz (112.3 kg)  01/27/21 246 lb 3.2 oz (111.7 kg)    Assessment: Review of patient past medical history, allergies, medications, health status, including review of consultants reports, laboratory and other test data, was performed as part of comprehensive evaluation and provision of chronic care management services.   SDOH:  (Social Determinants of Health) assessments and interventions performed:  SDOH Interventions    Flowsheet  Row Most Recent Value  SDOH Interventions   Financial Strain Interventions Intervention Not Indicated       CCM Care Plan  Allergies  Allergen Reactions   Demeclocycline Hives   Erythromycin Nausea And Vomiting and Other (See Comments)    Severe irritable bowel   Flagyl [Metronidazole] Nausea And Vomiting and Other (See Comments)    Severe irritable bowel   Glucophage [Metformin Hcl] Nausea And Vomiting and Other (See Comments)    "Sick" "I won't take anything that has metformin in it"   Tetracyclines & Related Hives and Rash   Diovan [Valsartan] Nausea Only        Sulfa Antibiotics Rash and Other (See Comments)    As child   Xanax [Alprazolam] Other (See Comments)    Unknown reaction    Medications Reviewed Today     Reviewed by Criss Alvine, DO (Physician) on 02/08/21 at 2007  Med List Status: Complete   Medication Order Taking? Sig Documenting Provider Last Dose Status Informant  albuterol (VENTOLIN HFA) 108 (90 Base) MCG/ACT inhaler [id number] Yes Inhale 2 puffs into the lungs every 8 (eight) hours as needed for wheezing or shortness of breath. [name], MD Unknown PRN Active Self  aspirin EC 81 MG tablet [id number] Yes Take 1 tablet (81 mg total) by mouth daily. Fritzi Mandes, MD Past Week Unknown Active Self  atorvastatin (LIPITOR) 40 MG tablet [id number] Yes Take 1 tablet (40 mg total) by mouth daily. [name], MD Past Week Unknown Active Self  clotrimazole-betamethasone (LOTRISONE) cream [id number] Yes Apply 1 application topically 2 (two) times daily as needed. [provider] Unknown PRN Active Self  Continuous Blood Gluc Receiver (FREESTYLE LIBRE 2 READER) DEVI [id number]  Use to check sugar at least TID [name], MD  Active Self  cyanocobalamin (,VITAMIN B-12,) 1000 MCG/ML injection [id number] Yes Inject 1 mL (1,000 mcg total) into the muscle every 30 (thirty) days. Inject 1 ml (1000 mcg ) IM weekly x 4,  Then monthly thereafter [name], MD   Active Self  dapagliflozin propanediol (FARXIGA) 10 MG TABS tablet [id number] Yes Take 10 mg by mouth daily. [provider]  Active Self  diphenhydrAMINE (BENADRYL) 25 MG tablet [id number] Yes Take 50 mg by mouth daily as needed for allergies. [provider] Unknown PRN Active Self  diphenoxylate-atropine (LOMOTIL) 2.5-0.025 MG tablet [id number] Yes Take 1 tablet by mouth 4 (four) times daily as needed for diarrhea or loose stools. [name], MD Unknown PRN Active Pharmacy Records  Med Note Francene Finders   Wed Feb 08, 2021  4:55 PM)    fexofenadine (ALLEGRA) 180 MG tablet [id number] Yes Take 180 mg by mouth daily. [provider]  Active Self  hyoscyamine (ANASPAZ) 0.125 MG TBDP disintergrating tablet [id number] No DISSOLVE UNDER TONGUE 15 MINUTES BEFORE MEALS  Patient not taking: No sig reported   [name], MD Not Taking Active Pharmacy Records  insulin aspart (NOVOLOG FLEXPEN) 100 UNIT/ML FlexPen [id number] Yes Inject 8-16 units up to 3 times daily before meals [name], MD  Active Self           Med Note Francene Finders   Wed Feb 08, 2021  4:55 PM) Based upon sliding scale  insulin glargine (LANTUS SOLOSTAR) 100 UNIT/ML Solostar Pen [id number] Yes Inject 15 Units into the skin daily.  Patient taking differently: Inject 15 Units into the skin at bedtime.   [name], MD 02/07/2021 2100 Active Self  Insulin Pen Needle (DROPLET PEN NEEDLES) 31G X 5 MM MISC [id number] Yes USE ONE NEEDLE SUBCUTANEOUSLY AS DIRECTED (REMOVE AND DISCARD NEEDLE IN SHARPS CONTAINER IMMEDIATELY AFTER USE) [name], MD  Active Self  KLOR-CON M20 20 MEQ tablet [id number] Yes TAKE 1 TABLET BY MOUTH EVERY DAY End, Christopher, MD Past Week Unknown Active Self  loperamide (IMODIUM) 2 MG capsule [id number] Yes Take 2-4 mg by mouth as needed for diarrhea or loose stools. [provider] Unknown PRN Active Self  metoprolol tartrate (LOPRESSOR) 25 MG tablet  [id number] Yes Take 1 tablet (25 mg total) by mouth 2 (two) times daily. Furth, Cadence H, PA-C Past Week Unknown Active Self  nitroGLYCERIN (NITROSTAT) 0.4 MG SL tablet [id number] Yes Place 1 tablet (0.4 mg total) under the tongue every 5 (five) minutes as needed for chest pain. Theora Gianotti, NP Unknown PRN Active Self  NONFORMULARY OR COMPOUNDED [place] [id number] Yes [facility]  Combination Pain Cream -  Baclofen 2%, Doxepin 5%, Gabapentin 6%, Topiramate 2%, Pentoxifylline 3% Apply 1-2 grams to affected area 3-4 times daily Qty. 120 gm 3 refills Gardiner Barefoot, DPM  Active Self           Med Note [facility], BRANDY L   Fri Sep 02, 2020  2:40 PM)    nystatin (MYCOSTATIN/NYSTOP) powder [id number] Yes Apply 1 g topically 4 (four) times daily as needed (rash). [provider] Unknown PRN Active Self  predniSONE (DELTASONE) 10 MG tablet [id number] Yes 6 tablets on Day 1 , then reduce by 1 tablet daily until gone [name], MD 02/07/2021 0800 Active Self  pregabalin (LYRICA) 50 MG capsule [id number] Yes TAKE 1 CAPSULE BY MOUTH 2 TIMES DAILY. Kathrynn Ducking, MD Past Week Unknown Active Self  Semaglutide,0.25 or 0.5MG/DOS, (OZEMPIC, 0.25 OR 0.5 MG/DOSE,) 2 MG/1.5ML SOPN [id number] Yes Inject 0.5 mg into the skin once a week.  Patient taking differently: Inject 0.5 mg into the skin every Sunday.   [name], MD 02/05/2021 Unknown Active Self  torsemide (DEMADEX) 10 MG tablet [id number] Yes Take 1 tablet (10 mg total) by mouth daily.  Patient taking differently: Take 10-20 mg by mouth See admin instructions. Take 1 tablet (49m) by mouth daily if <2 lbs gained and take 2 tablets (268m by mouth daily if >2 lbs gained   Furth, CaCrown HoldingsPA-C Past Week Unknown Active Self  traMADol (ULTRAM) 50 MG tablet [id number] Take 0.5 tablets (25 mg total) by mouth every 12 (twelve) hours as needed for severe  pain.  Patient not taking: No sig reported   Elmarie Shiley, MD  Not Taking Consider Medication Status and Discontinue Pharmacy Records           Med Note [facility] [place], BRANDY L   Fri Sep 02, 2020  2:40 PM)    umeclidinium-vilanterol [facility] ELLIPTA) 62.5-25 MCG/INH AEPB [id number] Yes Inhale 1 puff into the lungs daily. [name], MD Past Week Unknown Active Self  Med List Note Wynona Canes, [place] 07/22/17 1307): CPAP            Patient Active Problem List   Diagnosis Date Noted   PSVT (paroxysmal supraventricular tachycardia) ([place]) 12/15/2020   CKD stage 3 due to type 2 diabetes mellitus ([place]) 12/09/2020   Temporal arteritis syndrome ([place]) 11/17/2020   Other pulmonary embolism with acute cor pulmonale, unspecified chronicity ([place]) 11/17/2020   Mild episode of recurrent major depressive disorder ([place]) 11/17/2020   Hyperlipidemia associated with type 2 diabetes mellitus ([place]) 08/21/2020   Iron deficiency 06/28/2020   Skin lesion of chest wall 06/10/2020   Elevated alkaline phosphatase level 06/10/2020   Coagulation defect ([place]) 06/06/2020   Chronic heart failure with preserved ejection fraction (HFpEF) ([place]) 01/14/2020   Hyperlipidemia LDL goal <70 01/14/2020   Coronary artery disease involving native coronary artery of native heart without angina pectoris 01/13/2020   S/P cardiac catheterization 01/13/2020   Accelerating angina (HCC) 12/31/2019   Dysphagia 11/30/2019   Bladder pain 08/23/2019   Gastritis 08/07/2019   Lab test negative for COVID-19 virus 08/07/2019   S/P panniculectomy 06/16/2019   Major depressive disorder with current active episode 05/30/2019   Exposure to COVID-19 virus 12/25/2018   Functional diarrhea 10/22/2018   Bilateral cataracts 06/26/2018   Status post bariatric surgery 02/05/2018   GAD (generalized anxiety disorder) 02/01/2018   Rectocele 01/24/2018   Vaginal prolapse 01/04/2018   Hepatic steatosis 01/04/2018   Microalbuminuria due to type 2 diabetes mellitus ([place]) 11/21/2017   Hospital  discharge follow-up 09/22/2017   Hypocalcemia 09/22/2017   Hypotension 09/22/2017   Fecal incontinence 09/22/2017   B12 deficiency 09/22/2017   Transient disorientation 08/20/2017   Rotator cuff arthropathy, right 08/13/2017   Primary localized osteoarthrosis of shoulder 08/13/2017   Encounter for preoperative examination for general surgical procedure 08/03/2017   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 ([place]) 08/03/2017   Candidiasis, intertrigo 08/03/2017   Venous stasis dermatitis of right lower extremity 11/21/2016   Hypersomnia, persistent 06/18/2015   Mechanical breakdown of implanted electronic neurostimulator of peripheral nerve ([place]) 10/16/2014   Obesity hypoventilation syndrome ([place]) 08/13/2014   Frequent falls 06/23/2014   Chronic cough 05/05/2014   Adenoma of left adrenal gland 03/24/2014   New onset of headaches after age 18 03/23/2014   Vitamin D deficiency 01/06/2014   Weight gain following gastric bypass surgery 12/03/2013   Morbid obesity ([place]) 08/25/2013   Diaphragmatic hernia 04/06/2013   Diabetic polyneuropathy ([place]) 03/25/2013   Abnormality of gait 03/25/2013   Multiple pulmonary nodules 03/20/2013   DOE (dyspnea on exertion) 03/20/2013   Iron deficiency anemia 08/06/2012   Functional disorder of bladder 07/15/2012   Incomplete bladder emptying 07/15/2012   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[place]) 07/15/2012   Urge incontinence 07/15/2012   Increased frequency of urination 07/15/2012   OSA on CPAP 03/02/2012   Insomnia secondary to anxiety 02/29/2012   Sciatica 09/05/2011   Cervical stenosis of spinal canal 06/14/2011   Restless legs syndrome 03/13/2011   Essential hypertension 03/12/2011   Type 2  diabetes mellitus with diabetic neuropathy, unspecified ([place]) 03/12/2011   Hearing loss, right 03/12/2011    Immunization History  Administered Date(s) Administered   Fluad Quad(high Dose 65+) 03/06/2019, 06/07/2020   Influenza Split 06/13/2011    Influenza, High Dose Seasonal PF 08/21/2018   Influenza,inj,Quad PF,6+ Mos 05/04/2014, 05/12/2015   Influenza-Unspecified 04/15/2012   Moderna Sars-Covid-2 Vaccination 02/02/2020, 03/01/2020, 09/01/2020   Pneumococcal Conjugate-13 08/11/2013, 01/06/2014   Pneumococcal Polysaccharide-23 01/19/2010, 09/03/2018   Tdap 04/18/2016, 11/20/2020    Conditions to be addressed/monitored: CHF, HTN, HLD, and DMII  Care Plan : Medication Management  Updates made by De Hollingshead, RPH-CPP since 02/09/2021 12:00 AM     Problem: Diabetes, CHF      Long-Range Goal: Disease Progression Prevention   This Visit's Progress: On track  Recent Progress: On track  Priority: High  Note:   Current Barriers:  Unable to achieve control of diabetes   Pharmacist Clinical Goal(s):  Over the next 90 days, patient will achieve control of diabetes as evidenced by improvement in A1c through collaboration with PharmD and provider  Interventions: 1:1 collaboration with [name], MD regarding development and update of comprehensive plan of care as evidenced by provider attestation and co-signature Inter-disciplinary care team collaboration (see longitudinal plan of care) Comprehensive medication review performed; medication list updated in electronic medical record  Diabetes: Uncontrolled; current treatment: Farxiga 10 mg daily, Lantus 15 units QAM, Novolog 8-16 units up to TID with meals, Ozempic 0.25 mg weekly Hx intolerance to metformin d/t diarrhea Hx Trulicity - exacerbated diarrhea  Hx glipizide - stopped previously when A1c was well controlled  Hx Januvia - stopped previously when Trulicity was started  Avoiding TZD due to HF Current glucose readings: using Libre 2 CGM Calls the office to report that her Elenor Legato sensor was removed for imaging in the hospital and requests a sample. Will set at the front deks.   HFpEF in the setting of CKD: Appropriately managed; current regimen: torsemide  10-20 mg daily (based on fluid status), potassium 20 mEq daily, metoprolol tartrate 12.5 mg BID; established with cardiology, advanced HF clinic, nephrology Follow for medication changes at discharge.   Hyperlipidemia, secondary ASCVD prevention: Controlled per last lab result; current treatment: atorvastatin 40 mg daily  Current antiplatelet regimen: aspirin 81 mg daily, clopidogrel 75 mg daily Follow for medication changes at discharge  Depression/Anxiety with Insomnia: Uncontrolled per last office visit with me Consider reinitiation of SSRI and/or bupropion, as these are least likely to be causing sedation, if patient requests moving forward. Agree with trial without diazepam and Lunesta.  Follow for medication changes at discharge  Chronic Diarrhea, S/p Gastric Bypass, Esophageal "growth" Moderately well managed, given patient history. Current regimen: hyoscyamine 10 mg QID PRN; also using lomotil 4 tabs daily and loperamide BID Follow for medication changes at discharge  Nocturnal "leg cramps", as well as chronic pain More exacerbated recently per patient report. Current regimen: pregabalin 100 mg BID Also reports tremors, severe cramps. Missed appointment with neurology yesterday.  Encouraged to call [facility] to reschedule follow up Previously recommended to continue current regimen at this time. Monitor for increased sedation with fluctuations in renal function Follow for medication changes at discharge  COPD:  Well controlled; Current regimen: Anoro 62.5/25 mcg daily, albuterol HFA PRN Previously recommended to continue current regimen at this time. Follow for medication changes at discharge  Patient Goals/Self-Care Activities Over the next 90 days, patient will:  - take medications as prescribed check blood glucose at  least three times daily using CGM, document, and provide at future appointments  Follow Up Plan: Face to Face appointment with care management team  member scheduled for:  ~ 6 weeks as previously scheduled     Medication Assistance: None required.  Patient affirms current coverage meets needs.  Patient's preferred pharmacy is:  [name] [facility] [id number]: [phone]: [phone] [facility] [place] [address] [place] [address]Phone: [phone]: [phone] [facility] [place] [place] [address]Phone: [phone]: [phone] Follow Up:  Patient agrees to Care Plan and Follow-up.  Plan: Telephone follow up appointment with care management team member scheduled for:  6 weeks as previously scheduled  Catie TDarnelle Maffucci PharmD, BWest [place] CJim ThorpeClinical Pharmacist LOccidental Petroleumat BJohnson & [phone]

## 2021-02-09 NOTE — Evaluation (Signed)
Physical Therapy Evaluation Patient Details Name: Denise Sharp MRN: [id number] DOB: Jul 02, 1946 Today's Date: 02/09/2021   History of Present Illness  Pt admitted for cervical stenosis of spine with complaints of pain in buttocks s/p fall in shower. HIstory includes multiple falls, CAD s/p PCI to LAD, obestity, HTN, HLD, and DM.  Clinical Impression  Pt is a pleasant 75 year old female who was admitted for cervical stenosis of spine. Pt reports multiple falls with most recent in shower with bruising noted. Pt performs bed mobility with supervision, transfers with cga, and ambulation with cga and RW. No LOB noted while using RW. Encouraged pt to use AD at all times as most of her falls occur in home. For community distances, recommend supervision for stairs/curbs. Pt demonstrates deficits with strength/mobility/balance. Discussed at length, she isn't appropriate for SNF level at this time. Would benefit from skilled PT to address above deficits and promote optimal return to PLOF. Recommend transition to [facility] upon discharge from acute hospitalization. Also recommend home safety evaluation for decreasing fall risk.     Follow Up Recommendations Home health PT;Supervision for mobility/OOB    Equipment Recommendations  None recommended by PT    Recommendations for Other Services       Precautions / Restrictions Precautions Precautions: Fall Restrictions Weight Bearing Restrictions: No      Mobility  Bed Mobility Overal bed mobility: Needs Assistance Bed Mobility: Supine to Sit     Supine to sit: Supervision     General bed mobility comments: able to demonstrate all bed mobility with supervision. Pain reports when returning back to supine position.    Transfers Overall transfer level: Needs assistance Equipment used: Rolling walker (2 wheeled) Transfers: Sit to/from Stand Sit to Stand: Min guard         General transfer comment: safe technique with upright posture and  once standing, able to use RW safely. Good weight acceptance on B LE  Ambulation/Gait Ambulation/Gait assistance: Min guard Gait Distance (Feet): 120 Feet Assistive device: Rolling walker (2 wheeled) Gait Pattern/deviations: Step-through pattern     General Gait Details: ambulated with good reciprocal gait pattern and safe technique. LImited pain during mobility, however increases with position change  Stairs            Wheelchair Mobility    Modified Rankin (Stroke Patients Only)       Balance Overall balance assessment: History of Falls;Needs assistance Sitting-balance support: Feet supported Sitting balance-Leahy Scale: Good     Standing balance support: Bilateral upper extremity supported Standing balance-Leahy Scale: Fair                               Pertinent Vitals/Pain Pain Assessment: Faces Faces Pain Scale: Hurts even more Pain Location: R buttock Pain Descriptors / Indicators: Aching;Cramping Pain Intervention(s): Limited activity within patient's tolerance;Repositioned    Home Living Family/patient expects to be discharged to:: Private residence Living Arrangements: Spouse/significant other Available Help at Discharge: Family;Available 24 hours/day Type of Home: House Home Access: Ramped entrance     Home Layout: One level Home Equipment: Walker - 2 wheels;Walker - 4 wheels;Cane - single point      Prior Function Level of Independence: Independent with assistive device(s)         Comments: furniture ambulates within home and uses rollator for community distances. Reports multiple falls while on step and has difficulty with depth perception and foot clearance.     Hand  Dominance        Extremity/Trunk Assessment   Upper Extremity Assessment Upper Extremity Assessment: Overall WFL for tasks assessed    Lower Extremity Assessment Lower Extremity Assessment: Generalized weakness (B LE grossly 4/5; chronic B LE decreased  sensation distal to knees)       Communication   Communication: No difficulties  Cognition Arousal/Alertness: Awake/alert Behavior During Therapy: WFL for tasks assessed/performed Overall Cognitive Status: Within Functional Limits for tasks assessed                                 General Comments: very talkative      General Comments      Exercises     Assessment/Plan    PT Assessment Patient needs continued PT services  PT Problem List Decreased strength;Decreased balance;Decreased mobility;Pain;Decreased safety awareness       PT Treatment Interventions Gait training;Therapeutic exercise;DME instruction;Balance training    PT Goals (Current goals can be found in the Care Plan section)  Acute Rehab PT Goals Patient Stated Goal: to stop having falls PT Goal Formulation: With patient Time For Goal Achievement: 02/23/21 Potential to Achieve Goals: Good    Frequency Min 2X/week   Barriers to discharge        Co-evaluation               AM-PAC PT "6 Clicks" Mobility  Outcome Measure Help needed turning from your back to your side while in a flat bed without using bedrails?: None Help needed moving from lying on your back to sitting on the side of a flat bed without using bedrails?: None Help needed moving to and from a bed to a chair (including a wheelchair)?: A Little Help needed standing up from a chair using your arms (e.g., wheelchair or bedside chair)?: A Little Help needed to walk in hospital room?: A Little Help needed climbing 3-5 steps with a railing? : A Little 6 Click Score: 20    End of Session Equipment Utilized During Treatment: Gait belt Activity Tolerance: Patient limited by pain Patient left: in bed;with bed alarm set;with family/visitor present Nurse Communication: Mobility status PT Visit Diagnosis: History of falling (Z91.81);Difficulty in walking, not elsewhere classified (R26.2);Pain Pain - Right/Left: Right Pain -  part of body:  (buttock)    Time: 5374-8270 PT Time Calculation (min) (ACUTE ONLY): 42 min   Charges:   PT Evaluation $PT Eval Low Complexity: 1 Low PT Treatments $Gait Training: 23-37 mins        Greggory Stallion, PT, DPT [phone]   Denise Sharp 02/09/2021, 12:51 PM

## 2021-02-09 NOTE — Plan of Care (Signed)

## 2021-02-09 NOTE — Progress Notes (Signed)
PHARMACY CONSULT NOTE - FOLLOW UP  Pharmacy Consult for Electrolyte Monitoring and Replacement   Recent Labs: Potassium (mmol/L)  Date Value  02/09/2021 3.6  06/21/2014 4.0   Magnesium (mg/dL)  Date Value  02/09/2021 1.8  08/05/2012 1.5 (L)   Calcium (mg/dL)  Date Value  02/09/2021 8.5 (L)   Calcium, Total (mg/dL)  Date Value  06/21/2014 8.7   Albumin (g/dL)  Date Value  02/08/2021 3.3 (L)  10/30/2012 3.0 (L)   Sodium (mmol/L)  Date Value  02/09/2021 142  01/20/2021 137  06/21/2014 141    Assessment: 75 yo female admitted with generalized weakness and frequent falls. Pharmacy has been consulted to monitor and replace electrolytes.   Goal of Therapy:  Electrolytes WNL  Plan:  Labs redrawn this afternoon and electrolytes WNL. No additional replacement needed.  KCL 20 mEg PO daily ordered (PTA med)  BMP ordered with AM labs.  Pharmacy will continue to monitor and replace electrolytes as needed.   Pernell Dupre, PharmD, BCPS Clinical Pharmacist 02/09/2021 3:01 PM

## 2021-02-10 ENCOUNTER — Telehealth: Payer: Self-pay

## 2021-02-10 ENCOUNTER — Telehealth: Payer: Self-pay | Admitting: Internal Medicine

## 2021-02-10 ENCOUNTER — Encounter: Payer: Self-pay | Admitting: Internal Medicine

## 2021-02-10 DIAGNOSIS — E861 Hypovolemia: Secondary | ICD-10-CM | POA: Diagnosis not present

## 2021-02-10 DIAGNOSIS — I9589 Other hypotension: Secondary | ICD-10-CM

## 2021-02-10 DIAGNOSIS — M4802 Spinal stenosis, cervical region: Secondary | ICD-10-CM | POA: Diagnosis not present

## 2021-02-10 DIAGNOSIS — R531 Weakness: Secondary | ICD-10-CM | POA: Diagnosis not present

## 2021-02-10 DIAGNOSIS — R3989 Other symptoms and signs involving the genitourinary system: Secondary | ICD-10-CM

## 2021-02-10 LAB — BASIC METABOLIC PANEL
Anion gap: 8 (ref 5–15)
BUN: 23 mg/dL (ref 8–23)
CO2: 29 mmol/L (ref 22–32)
Calcium: 8.5 mg/dL — ABNORMAL LOW (ref 8.9–10.3)
Chloride: 103 mmol/L (ref 98–111)
Creatinine, Ser: 1.34 mg/dL — ABNORMAL HIGH (ref 0.44–1.00)
GFR, Estimated: 41 mL/min — ABNORMAL LOW (ref >60)
Glucose, Bld: 119 mg/dL — ABNORMAL HIGH (ref 70–99)
Potassium: 3.9 mmol/L (ref 3.5–5.1)
Sodium: 140 mmol/L (ref 135–145)

## 2021-02-10 LAB — MAGNESIUM: Magnesium: 1.9 mg/dL (ref 1.7–2.4)

## 2021-02-10 LAB — GLUCOSE, CAPILLARY
Glucose-Capillary: 158 mg/dL — ABNORMAL HIGH (ref 70–99)
Glucose-Capillary: 158 mg/dL — ABNORMAL HIGH (ref 70–99)

## 2021-02-10 LAB — HEMOGLOBIN A1C
Hgb A1c MFr Bld: 8.9 % — ABNORMAL HIGH (ref 4.8–5.6)
Mean Plasma Glucose: 209 mg/dL

## 2021-02-10 MED ORDER — METOPROLOL TARTRATE 25 MG PO TABS: 25.0000 mg | ORAL_TABLET | Freq: Every day | ORAL | 0 refills | Status: DC

## 2021-02-10 MED ORDER — LIDOCAINE 5 % EX PTCH
1.0000 | MEDICATED_PATCH | CUTANEOUS | Status: DC
  Administered 2021-02-10: 1 via TRANSDERMAL
  Filled 2021-02-10: qty 1

## 2021-02-10 NOTE — Telephone Encounter (Signed)
Patient calling Scheduled hospital f/u 08/03 with Dr End  Would like to know if ok to take the changes on medications that she was discharged with  Please call to discuss

## 2021-02-10 NOTE — Progress Notes (Signed)
Denise Sharp to be D/C'd Home per MD order.  Discussed with the patient and all questions fully answered.  VSS, Skin clean, dry and intact without evidence of skin break down, no evidence of skin tears noted. IV catheter discontinued intact. Site without signs and symptoms of complications. Dressing and pressure applied.  An After Visit Summary was printed and given to the patient. Patient prescriptions sent to pharmacy. Social work discussed with patient about home health services.  D/c education completed with patient/family including follow up instructions, medication list, d/c activities limitations if indicated, with other d/c instructions as indicated by MD - patient able to verbalize understanding, all questions fully answered.   Patient instructed to return to ED, call 911, or call MD for any changes in condition.   Patient escorted via [place], and D/C home via private auto.  Denise Sharp 02/10/2021 1:28 PM

## 2021-02-10 NOTE — Telephone Encounter (Signed)
Pt called and states that she was in the hospital for a couple of days and it reads on the discharge papers that she needs to see her provider in about three days. Pt states that she already has an appt on 03/01/21 and wants to know if that will suffice. Please call her back on her cell phone [phone].

## 2021-02-10 NOTE — Progress Notes (Signed)
Occupational Therapy Treatment Patient Details Name: Denise Sharp MRN: [id number] DOB: Mar 06, 1946 Today's Date: 02/10/2021    History of present illness Pt admitted for cervical stenosis of spine with complaints of pain in buttocks s/p fall in shower. HIstory includes multiple falls, CAD s/p PCI to LAD, obestity, HTN, HLD, and DM.   OT comments  Pt seen for OT tx. Pt reported losing handout from previous date. New handout provided with new AE recommendations and pt/spouse instructed in recommendations and OT answered questions. Pt placed new handout in her purse for safe keeping. Pt continues to benefit from skilled OT services. Continue to recommend [place].    Follow Up Recommendations  Home health OT;Other (comment)    Equipment Recommendations  Other (comment) (reacher, toileting aide, non skid tape for shower)    Recommendations for Other Services      Precautions / Restrictions Precautions Precautions: Fall Restrictions Weight Bearing Restrictions: No       Mobility Bed Mobility                    Transfers                      Balance                                           ADL either performed or assessed with clinical judgement   ADL                                               Vision       Perception     Praxis      Cognition                                                Exercises Other Exercises Other Exercises: Pt reported losing handout from previous date. New handout provided with new AE recommendations and pt/spouse instructed in recommendations and OT answered questions. Pt placed new handout in her purse for safe keeping.   Shoulder Instructions       General Comments      Pertinent Vitals/ Pain          Home Living                                          Prior Functioning/Environment              Frequency  Min 1X/week         Progress Toward Goals  OT Goals(current goals can now be found in the care plan section)  Progress towards OT goals: Progressing toward goals  Acute Rehab OT Goals Patient Stated Goal: to stop having falls OT Goal Formulation: With patient/family Time For Goal Achievement: 02/23/21 Potential to Achieve Goals: Good  Plan Discharge plan remains appropriate;Frequency remains appropriate    Co-evaluation                 AM-PAC OT "6 Clicks" Daily Activity  Outcome Measure   Help from another person eating meals?: None Help from another person taking care of personal grooming?: None Help from another person toileting, which includes using toliet, bedpan, or urinal?: A Lot Help from another person bathing (including washing, rinsing, drying)?: A Lot Help from another person to put on and taking off regular upper body clothing?: None Help from another person to put on and taking off regular lower body clothing?: A Lot 6 Click Score: 18    End of Session    OT Visit Diagnosis: Other abnormalities of gait and mobility (R26.89)   Activity Tolerance Patient tolerated treatment well   Patient Left in bed;with call bell/phone within reach;with bed alarm set;with family/visitor present   Nurse Communication          Time: 1119-1130 OT Time Calculation (min): 11 min  Charges: OT General Charges $OT Visit: 1 Visit OT Treatments $Self Care/Home Management : 8-22 mins  Hanley Hays, MPH, MS, OTR/L ascom [phone] 02/10/21, 1:50 PM

## 2021-02-10 NOTE — Telephone Encounter (Signed)
Left message for patient to return call back.  

## 2021-02-11 DIAGNOSIS — R42 Dizziness and giddiness: Secondary | ICD-10-CM | POA: Diagnosis not present

## 2021-02-11 DIAGNOSIS — F32A Depression, unspecified: Secondary | ICD-10-CM | POA: Diagnosis not present

## 2021-02-11 DIAGNOSIS — E1142 Type 2 diabetes mellitus with diabetic polyneuropathy: Secondary | ICD-10-CM | POA: Diagnosis not present

## 2021-02-11 DIAGNOSIS — M545 Low back pain, unspecified: Secondary | ICD-10-CM | POA: Diagnosis not present

## 2021-02-11 DIAGNOSIS — R296 Repeated falls: Secondary | ICD-10-CM | POA: Diagnosis not present

## 2021-02-11 DIAGNOSIS — F411 Generalized anxiety disorder: Secondary | ICD-10-CM | POA: Diagnosis not present

## 2021-02-11 DIAGNOSIS — E11610 Type 2 diabetes mellitus with diabetic neuropathic arthropathy: Secondary | ICD-10-CM | POA: Diagnosis not present

## 2021-02-11 DIAGNOSIS — Z6841 Body Mass Index (BMI) 40.0 and over, adult: Secondary | ICD-10-CM | POA: Diagnosis not present

## 2021-02-11 DIAGNOSIS — E1342 Other specified diabetes mellitus with diabetic polyneuropathy: Secondary | ICD-10-CM | POA: Diagnosis not present

## 2021-02-11 DIAGNOSIS — G8929 Other chronic pain: Secondary | ICD-10-CM | POA: Diagnosis not present

## 2021-02-11 DIAGNOSIS — E785 Hyperlipidemia, unspecified: Secondary | ICD-10-CM | POA: Diagnosis not present

## 2021-02-11 DIAGNOSIS — I110 Hypertensive heart disease with heart failure: Secondary | ICD-10-CM | POA: Diagnosis not present

## 2021-02-11 DIAGNOSIS — M1990 Unspecified osteoarthritis, unspecified site: Secondary | ICD-10-CM | POA: Diagnosis not present

## 2021-02-11 DIAGNOSIS — Z9181 History of falling: Secondary | ICD-10-CM | POA: Diagnosis not present

## 2021-02-11 DIAGNOSIS — G4733 Obstructive sleep apnea (adult) (pediatric): Secondary | ICD-10-CM | POA: Diagnosis not present

## 2021-02-11 DIAGNOSIS — W182XXD Fall in (into) shower or empty bathtub, subsequent encounter: Secondary | ICD-10-CM | POA: Diagnosis not present

## 2021-02-11 DIAGNOSIS — K589 Irritable bowel syndrome without diarrhea: Secondary | ICD-10-CM | POA: Diagnosis not present

## 2021-02-11 DIAGNOSIS — I2510 Atherosclerotic heart disease of native coronary artery without angina pectoris: Secondary | ICD-10-CM | POA: Diagnosis not present

## 2021-02-11 DIAGNOSIS — I5030 Unspecified diastolic (congestive) heart failure: Secondary | ICD-10-CM | POA: Diagnosis not present

## 2021-02-11 DIAGNOSIS — Z794 Long term (current) use of insulin: Secondary | ICD-10-CM | POA: Diagnosis not present

## 2021-02-11 DIAGNOSIS — N183 Chronic kidney disease, stage 3 unspecified: Secondary | ICD-10-CM | POA: Diagnosis not present

## 2021-02-11 DIAGNOSIS — M4802 Spinal stenosis, cervical region: Secondary | ICD-10-CM | POA: Diagnosis not present

## 2021-02-11 DIAGNOSIS — G2581 Restless legs syndrome: Secondary | ICD-10-CM | POA: Diagnosis not present

## 2021-02-11 DIAGNOSIS — E1122 Type 2 diabetes mellitus with diabetic chronic kidney disease: Secondary | ICD-10-CM | POA: Diagnosis not present

## 2021-02-11 NOTE — Discharge Summary (Signed)
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 12/2019 Echo: EF 65-70%, Gr2 DD. No significant valvular dzs.   Abnormality of gait 03/25/2013   Adrenal mass, left ([place])    Anemia    iron deficient post  2 unit txfsn 2009, normal endo/colonoscopy by [facility]   Arthritis    CAD (coronary artery disease) with h/o Atypical Chest Pain    a. 04/1986 Cath ([facility]): nl cors, EF 65%; b. 07/2012 Cath [facility]): Diff minor irregs; c. 07/2016 MV [facility]): "Equivocal"; d. 08/2016 Cardiac CT Ca2+ score [facility]): Ca2+ 5852; e. 05/2019 MV: No ischemia. EF 75%; f. 12/2019 PCI: LM nl, LAD 65ost (3.5x12 Resolute DES), 29m (2.75x12 Resolute DES),LCX/RCA nl; g. 08/2020 Cath: patent LAD stents, RCA 40ost, elev RH pressures, EF >65%.   Cervical spinal stenosis 1994   due to trauma to back ([employer] accident), has intermittent paralysis and parasthesias   Cervicogenic headache 03/23/2014   Depression    Dizziness    chronic dizziness   DJD (degenerative joint disease)    a. Chronic R shoulder pain pending R  shoulder replacement 07/2017.   Esophageal stenosis March 2011   with transietn outlet obstruction by food, cleared by EGD    Family history of adverse reaction to anesthesia    daughter PONV   Gastric bypass status for obesity    Headache(784.0)    Hypertension    IBS (irritable bowel syndrome)    Left bundle branch block (LBBB)    a. Intermittently present - likely rate related. - patient denies   Obesity    Obstructive sleep apnea    using CPAP   Polyneuropathy in diabetes(357.2) 03/25/2013   Restless leg syndrome    Rotator cuff arthropathy, right 08/13/2017   Syncope and collapse 03/12/2014   Type II diabetes mellitus ([place])    HOSPITAL COURSE:  75 y.o. female with medical history significant for coronary artery disease status post PCI to LAD, obesity, hypertension, obstructive sleep apnea, hyperlipidemia, insulin-dependent diabetes mellitus, chronic back pain, admitted for frequent falls and generalized weakness   Frequent falls: Multifactorial including chronic pain, peripheral neuropathy, morbid obesity, polypharmacy -Patient has had extensive work-up including CT head, cervical spine, chest, abdomen and pelvis, lumbar spine essentially not showing any acute pathology -PT/OT recommends home health which are being set up by TOC at discharge  -I have requested patient/husband to set up nursing aide or private caregivers for supervision and help at home   #Hypokalemia Repleted and resolved   # Hypotension with Hypertension- Reduce dose of  metoprolol tartrate 25 mg p.o. to once daily at discharge instead of twice daily due to low normal BP.  # Obesity- Status post gastric bypass surgery   # Obstructive sleep apnea-CPAP nightly   # Diabetic polyneuropathy-continue Lyrica 50 mg PO BID   # Insulin-dependent diabetes mellitus - resume home regimen at D/C.  #Generalized anxiety disorder  # Coronary artery disease-continue aspirin 81 mg daily, atorvastatin 40 mg nightly,  metoprolol tartrate 25 mg once daily (instead of BID). Patient is asymptomatic from cardiac standpoint   # Polypharmacy putting patient at risk for frequent falls - Extensive education with patient to avoid Benadryl  - Recommended Allegra nonsedating and/or Claritin nonsedating over-the-counter for allergies - Stop tramadol  - Also discussed that the combination of Lyrica, tramadol, Benadryl will increase patient's risk of falling and respiratory distress -Recommend outpatient pain management and neurology follow-up   # Lumbar ecchymosis-secondary to trauma in shower, present on admission, approximately 20 to 25 cm in length and 7 to 10 cm in diameter that is dark purple. Conservative mgmt   DISCHARGE CONDITIONS:  stable CONSULTS OBTAINED:   DRUG ALLERGIES:   Allergies  Allergen Reactions   Demeclocycline Hives   Erythromycin Nausea And Vomiting and Other (See Comments)    Severe irritable bowel   Flagyl [Metronidazole] Nausea And Vomiting and Other (See Comments)    Severe irritable bowel   Glucophage [Metformin Hcl] Nausea And Vomiting and Other (See Comments)    "Sick" "I won't take anything that has metformin in it"   Tetracyclines & Related Hives and Rash   Diovan [Valsartan] Nausea Only        Sulfa Antibiotics Rash and Other (See Comments)    As child   Xanax [Alprazolam] Other (See Comments)    Unknown reaction   DISCHARGE MEDICATIONS:   Allergies as of 02/10/2021       Reactions   Demeclocycline Hives   Erythromycin Nausea And Vomiting, Other (See Comments)   Severe irritable bowel   Flagyl [metronidazole] Nausea And Vomiting, Other (See Comments)   Severe irritable bowel   Glucophage [metformin Hcl] Nausea And Vomiting, Other (See Comments)   "Sick" "I won't take anything that has metformin in it"   Tetracyclines & Related Hives, Rash   Diovan [valsartan] Nausea Only       Sulfa Antibiotics Rash, Other (See Comments)   As child   Xanax [alprazolam] Other  (See Comments)   Unknown reaction        Medication List     STOP taking these medications    diphenhydrAMINE 25 MG tablet Commonly known as: BENADRYL   hyoscyamine 0.125 MG Tbdp disintergrating tablet Commonly known as: ANASPAZ   loperamide 2 MG capsule Commonly known as: IMODIUM   predniSONE 10 MG tablet Commonly known as: DELTASONE   traMADol 50 MG tablet Commonly known as: ULTRAM       TAKE these medications    albuterol 108 (90 Base) MCG/ACT inhaler Commonly known as: VENTOLIN HFA Inhale 2 puffs into the lungs every 8 (eight) hours as needed for wheezing or shortness of breath.   Anoro Ellipta 62.5-25 MCG/INH Aepb Generic drug: umeclidinium-vilanterol Inhale 1 puff into the lungs daily.   aspirin EC 81 MG tablet Take 1 tablet (81 mg total) by mouth daily.   atorvastatin 40 MG tablet Commonly known as: LIPITOR Take 1 tablet (40 mg total) by mouth daily.   clotrimazole-betamethasone cream Commonly known as: LOTRISONE Apply 1 application topically 2 (two) times daily as  needed.   cyanocobalamin 1000 MCG/ML injection Commonly known as: (VITAMIN B-12) Inject 1 mL (1,000 mcg total) into the muscle every 30 (thirty) days. Inject 1 ml (1000 mcg ) IM weekly x 4,  Then monthly thereafter   dapagliflozin propanediol 10 MG Tabs tablet Commonly known as: FARXIGA Take 10 mg by mouth daily.   diphenoxylate-atropine 2.5-0.025 MG tablet Commonly known as: Lomotil Take 1 tablet by mouth 4 (four) times daily as needed for diarrhea or loose stools.   Droplet Pen Needles 31G X 5 MM Misc Generic drug: Insulin Pen Needle USE ONE NEEDLE SUBCUTANEOUSLY AS DIRECTED (REMOVE AND DISCARD NEEDLE IN SHARPS CONTAINER IMMEDIATELY AFTER USE)   fexofenadine 180 MG tablet Commonly known as: ALLEGRA Take 180 mg by mouth daily.   FreeStyle Libre 2 Reader [employer] Use to check sugar at least TID   Klor-Con M20 20 MEQ tablet Generic drug: potassium chloride SA TAKE 1 TABLET BY  MOUTH EVERY DAY   Lantus SoloStar 100 UNIT/ML Solostar Pen Generic drug: insulin glargine Inject 15 Units into the skin daily. What changed: when to take this   metoprolol tartrate 25 MG tablet Commonly known as: LOPRESSOR Take 1 tablet (25 mg total) by mouth daily. What changed: when to take this   nitroGLYCERIN 0.4 MG SL tablet Commonly known as: NITROSTAT Place 1 tablet (0.4 mg total) under the tongue every 5 (five) minutes as needed for chest pain.   NONFORMULARY OR COMPOUNDED [place]  Combination Pain Cream -  Baclofen 2%, Doxepin 5%, Gabapentin 6%, Topiramate 2%, Pentoxifylline 3% Apply 1-2 grams to affected area 3-4 times daily Qty. 120 gm 3 refills   NovoLOG FlexPen 100 UNIT/ML FlexPen Generic drug: insulin aspart Inject 8-16 units up to 3 times daily before meals   nystatin powder Commonly known as: MYCOSTATIN/NYSTOP Apply 1 g topically 4 (four) times daily as needed (rash).   Ozempic (0.25 or 0.5 MG/DOSE) 2 MG/1.5ML Sopn Generic drug: Semaglutide(0.25 or 0.5MG /DOS) Inject 0.5 mg into the skin once a week. What changed: when to take this   pregabalin 50 MG capsule Commonly known as: LYRICA TAKE 1 CAPSULE BY MOUTH 2 TIMES DAILY.   torsemide 10 MG tablet Commonly known as: DEMADEX Take 1 tablet (10 mg total) by mouth daily. What changed:  how much to take when to take this additional instructions       DISCHARGE INSTRUCTIONS:   DIET:  Cardiac diet DISCHARGE CONDITION:  Stable ACTIVITY:  Activity as tolerated OXYGEN:  Home Oxygen: No.  Oxygen Delivery: room air DISCHARGE LOCATION:  Home with HHPT, OT, SW, N. AID, Nurse   If you experience worsening of your admission symptoms, develop shortness of breath, life threatening emergency, suicidal or homicidal thoughts you must seek medical attention immediately by calling 911 or calling your MD immediately  if symptoms less severe.  You Must read complete instructions/literature along  with all the possible adverse reactions/side effects for all the Medicines you take and that have been prescribed to you. Take any new Medicines after you have completely understood and accpet all the possible adverse reactions/side effects.   Please note  You were cared for by a hospitalist during your hospital stay. If you have any questions about your discharge medications or the care you received while you were in the hospital after you are discharged, you can call the unit and asked to speak with the hospitalist on call if the hospitalist that took care of you is not available. Once you are discharged,  your primary care physician will handle any further medical issues. Please note that NO REFILLS for any discharge medications will be authorized once you are discharged, as it is imperative that you return to your primary care physician (or establish a relationship with a primary care physician if you do not have one) for your aftercare needs so that they can reassess your need for medications and monitor your lab values.    On the day of Discharge:  VITAL SIGNS:  Blood pressure 119/60, pulse 64, temperature 98.9 F (37.2 C), resp. rate 16, height 5\' 4"  (1.626 m), weight 112.2 kg, SpO2 100 %. PHYSICAL EXAMINATION:  GENERAL:  75 y.o.-year-old patient lying in the bed with no acute distress.  EYES: Pupils equal, round, reactive to light and accommodation. No scleral icterus. Extraocular muscles intact.  HEENT: Head atraumatic, normocephalic. Oropharynx and nasopharynx clear.  NECK:  Supple, no jugular venous distention. No thyroid enlargement, no tenderness.  LUNGS: Normal breath sounds bilaterally, no wheezing, rales,rhonchi or crepitation. No use of accessory muscles of respiration.  CARDIOVASCULAR: S1, S2 normal. No murmurs, rubs, or gallops.  ABDOMEN: Soft, non-tender, non-distended. Bowel sounds present. No organomegaly or mass.  EXTREMITIES: No pedal edema, cyanosis, or clubbing.   NEUROLOGIC: Cranial nerves II through XII are intact. Muscle strength 5/5 in all extremities. Sensation intact. Gait not checked.  PSYCHIATRIC: The patient is alert and oriented x 3.  SKIN: No obvious rash, lesion, or ulcer.  DATA REVIEW:   CBC Recent Labs  Lab 02/09/21 0435  WBC 9.2  HGB 13.2  HCT 39.9  PLT 238    Chemistries  Recent Labs  Lab 02/08/21 1054 02/09/21 0435 02/10/21 0555  NA 139   < > 140  K 4.4   < > 3.9  CL 95*   < > 103  CO2 29   < > 29  GLUCOSE 191*   < > 119*  BUN 27*   < > 23  CREATININE 1.45*   < > 1.34*  CALCIUM 8.8*   < > 8.5*  MG  --    < > 1.9  AST 42*  --   --   ALT 20  --   --   ALKPHOS 102  --   --   BILITOT 1.9*  --   --    < > = values in this interval not displayed.     Outpatient follow-up  Follow-up Information     [name], MD. Schedule an appointment as soon as possible for a visit in 3 day(s).   Specialty: Internal Medicine Why: [facility] Discharge F/UP Contact information: [address] [place] [address] [phone]         End, Harrell Gave, MD. Schedule an appointment as soon as possible for a visit in 1 week(s).   Specialty: Cardiology Why: [facility] Discharge F/UP Contact information: [address] [place] [address] [phone]         Lyla Son, MD. Schedule an appointment as soon as possible for a visit in 4 week(s).   Specialty: Nephrology Contact information: [address] [place] [address] [phone]                    Management plans discussed with the patient, family and they are in agreement.  CODE STATUS: Prior   TOTAL TIME TAKING CARE OF THIS PATIENT: 45 minutes.    Max Sane M.D on 02/11/2021 at 2:47  PM  [facility]   CC: Primary care physician; [name], MD   Note: This dictation was prepared with Dragon dictation along with smaller phrase technology. Any  transcriptional errors that result from this process are unintentional.

## 2021-02-13 ENCOUNTER — Ambulatory Visit (INDEPENDENT_AMBULATORY_CARE_PROVIDER_SITE_OTHER): Payer: Medicare Other | Admitting: Urology

## 2021-02-13 ENCOUNTER — Encounter: Payer: Self-pay | Admitting: Internal Medicine

## 2021-02-13 ENCOUNTER — Other Ambulatory Visit: Payer: Self-pay | Admitting: Nephrology

## 2021-02-13 ENCOUNTER — Other Ambulatory Visit: Payer: Self-pay

## 2021-02-13 ENCOUNTER — Encounter: Payer: Self-pay | Admitting: Urology

## 2021-02-13 VITALS — BP 111/74 | HR 67 | Ht 64.0 in | Wt 244.0 lb

## 2021-02-13 DIAGNOSIS — N3281 Overactive bladder: Secondary | ICD-10-CM

## 2021-02-13 DIAGNOSIS — R310 Gross hematuria: Secondary | ICD-10-CM

## 2021-02-13 DIAGNOSIS — I10 Essential (primary) hypertension: Secondary | ICD-10-CM

## 2021-02-13 DIAGNOSIS — D649 Anemia, unspecified: Secondary | ICD-10-CM

## 2021-02-13 DIAGNOSIS — R319 Hematuria, unspecified: Secondary | ICD-10-CM

## 2021-02-13 DIAGNOSIS — I25119 Atherosclerotic heart disease of native coronary artery with unspecified angina pectoris: Secondary | ICD-10-CM | POA: Diagnosis not present

## 2021-02-13 DIAGNOSIS — N1831 Chronic kidney disease, stage 3a: Secondary | ICD-10-CM

## 2021-02-13 LAB — CULTURE, BLOOD (ROUTINE X 2)
Culture: NO GROWTH
Culture: NO GROWTH
Special Requests: ADEQUATE
Special Requests: ADEQUATE

## 2021-02-13 NOTE — Telephone Encounter (Signed)
Pt called returning a call  °

## 2021-02-13 NOTE — Patient Instructions (Signed)
Cystoscopy Cystoscopy is a procedure that is used to help diagnose and sometimes treat conditions that affect the lower urinary tract. The lower urinary tract includes the bladder and the urethra. The urethra is the tube that drains urine from the bladder. Cystoscopy is done using a thin, tube-shaped instrument with a light and camera at the end (cystoscope). The cystoscope may be hard or flexible, depending on the goal of the procedure. The cystoscope is inserted through the urethra, into the bladder. Cystoscopy may be recommended if you have: Urinary tract infections that keep coming back. Blood in the urine (hematuria). An inability to control when you urinate (urinary incontinence) or an overactive bladder. Unusual cells found in a urine sample. A blockage in the urethra, such as a urinary stone. Painful urination. An abnormality in the bladder found during an intravenous pyelogram (IVP) or CT scan. Cystoscopy may also be done to remove a sample of tissue to be examined under a microscope (biopsy). What are the risks? Generally, this is a safe procedure. However, problems may occur, including: Infection. Bleeding.  What happens during the procedure?  You will be given one or more of the following: A medicine to numb the area (local anesthetic). The area around the opening of your urethra will be cleaned. The cystoscope will be passed through your urethra into your bladder. Germ-free (sterile) fluid will flow through the cystoscope to fill your bladder. The fluid will stretch your bladder so that your health care provider can clearly examine your bladder walls. Your doctor will look at the urethra and bladder. The cystoscope will be removed The procedure may vary among health care providers  What can I expect after the procedure? After the procedure, it is common to have: Some soreness or pain in your abdomen and urethra. Urinary symptoms. These include: Mild pain or burning when you  urinate. Pain should stop within a few minutes after you urinate. This may last for up to 1 week. A small amount of blood in your urine for several days. Feeling like you need to urinate but producing only a small amount of urine. Follow these instructions at home: General instructions Return to your normal activities as told by your health care provider.  Do not drive for 24 hours if you were given a sedative during your procedure. Watch for any blood in your urine. If the amount of blood in your urine increases, call your health care provider. If a tissue sample was removed for testing (biopsy) during your procedure, it is up to you to get your test results. Ask your health care provider, or the department that is doing the test, when your results will be ready. Drink enough fluid to keep your urine pale yellow. Keep all follow-up visits as told by your health care provider. This is important. Contact a health care provider if you: Have pain that gets worse or does not get better with medicine, especially pain when you urinate. Have trouble urinating. Have more blood in your urine. Get help right away if you: Have blood clots in your urine. Have abdominal pain. Have a fever or chills. Are unable to urinate. Summary Cystoscopy is a procedure that is used to help diagnose and sometimes treat conditions that affect the lower urinary tract. Cystoscopy is done using a thin, tube-shaped instrument with a light and camera at the end. After the procedure, it is common to have some soreness or pain in your abdomen and urethra. Watch for any blood in your urine.   If the amount of blood in your urine increases, call your health care provider. If you were prescribed an antibiotic medicine, take it as told by your health care provider. Do not stop taking the antibiotic even if you start to feel better. This information is not intended to replace advice given to you by your health care provider. Make  sure you discuss any questions you have with your health care provider. Document Revised: 06/24/2018 Document Reviewed: 06/24/2018 Elsevier Patient Education  2020 [employer]  

## 2021-02-13 NOTE — Addendum Note (Signed)
Addended by: Evelina Bucy on: 02/13/2021 04:30 PM   Modules accepted: Orders

## 2021-02-13 NOTE — Progress Notes (Signed)
02/13/2021 2:55 PM   Denise Sharp November 09, 1945 [id number]  Referring provider: Kathlen Mody, Cadence H, PA-C [address] [place],  [place] [address]  Chief Complaint  Patient presents with   Hematuria    HPI: Recent hospital discharge.  Frequent falls.  Morbid obesity.  Chronic pain.  Peripheral neuropathy.  Extensive work-up.  Urine culture in May 2022 negative.  CT scan July 2022 normal kidneys  Saw the patient and I believe many years ago in [place].  She had failed multiple medications.  Because of obesity and other factors did not think she was a good candidate for refractory therapies.  It turns out she had InterStim 2015 in [place].  Almost completely dry can hold it for many hours.  She holds it too long she has a bit urge incontinence  I think last month she saw gross hematuria not treated.  Has some discomfort associated with it.  And she was admitted to the hospital.  Takes daily aspirin.  No blood thinner.  No smoking history   PMH: Past Medical History:  Diagnosis Date   (HFpEF) heart failure with preserved ejection fraction ([place])    a. 12/2019 Echo: EF 65-70%, Gr2 DD. No significant valvular dzs.   Abnormality of gait 03/25/2013   Adrenal mass, left ([place])    Anemia    iron deficient post  2 unit txfsn 2009, normal endo/colonoscopy by [facility]   Arthritis    CAD (coronary artery disease) with h/o Atypical Chest Pain    a. 04/1986 Cath ([facility]): nl cors, EF 65%; b. 07/2012 Cath [facility]): Diff minor irregs; c. 07/2016 MV [facility]): "Equivocal"; d. 08/2016 Cardiac CT Ca2+ score [facility]): Ca2+ 0737; e. 05/2019 MV: No ischemia. EF 75%; f. 12/2019 PCI: LM nl, LAD 65ost (3.5x12 Resolute DES), 48m (2.75x12 Resolute DES),LCX/RCA nl; g. 08/2020 Cath: patent LAD stents, RCA 40ost, elev RH pressures, EF >65%.   Cervical spinal stenosis 1994   due to trauma to back ([employer] accident), has intermittent paralysis and parasthesias   Cervicogenic headache 03/23/2014   Depression     Dizziness    chronic dizziness   DJD (degenerative joint disease)    a. Chronic R shoulder pain pending R shoulder replacement 07/2017.   Esophageal stenosis March 2011   with transietn outlet obstruction by food, cleared by EGD    Family history of adverse reaction to anesthesia    daughter PONV   Gastric bypass status for obesity    Headache(784.0)    Hypertension    IBS (irritable bowel syndrome)    Left bundle branch block (LBBB)    a. Intermittently present - likely rate related. - patient denies   Obesity    Obstructive sleep apnea    using CPAP   Polyneuropathy in diabetes(357.2) 03/25/2013   Restless leg syndrome    Rotator cuff arthropathy, right 08/13/2017   Syncope and collapse 03/12/2014   Type II diabetes mellitus ([place])     Surgical History: Past Surgical History:  Procedure Laterality Date   ABDOMINAL HYSTERECTOMY     APPENDECTOMY     CARDIAC CATHETERIZATION     COLONOSCOPY     CORONARY STENT INTERVENTION N/A 01/04/2020   Procedure: CORONARY STENT INTERVENTION;  Surgeon: [name], MD;  Location: [facility];  Service: Cardiovascular;  Laterality: N/A;  LAD    DIAPHRAGMATIC HERNIA REPAIR  2015   ESOPHAGEAL DILATION     multiple   ESOPHAGOGASTRODUODENOSCOPY     GALLBLADDER SURGERY  1976   GALLBLADDER SURGERY  resection   GASTRIC BYPASS     GASTRIC BYPASS  2000, 2005   Dr. [name] IMPLANT PLACEMENT  April 2013   [place] REPLACEMENT  2007   bilateral knee. [name],  [name]   LEFT HEART CATH AND CORS/GRAFTS ANGIOGRAPHY N/A 12/31/2019   Procedure: LEFT HEART CATH AND CORS/GRAFTS ANGIOGRAPHY;  Surgeon: [name], MD;  Location: [facility];  Service: Cardiovascular;  Laterality: N/A;   PANNICULECTOMY  06/16/2019   PANNICULECTOMY N/A 06/16/2019   Procedure: PANNICULECTOMY;  Surgeon: [name], MD;  Location: [facility];  Service: Plastics;  Laterality: N/A;  3 hours, please   REVERSE SHOULDER ARTHROPLASTY Right  08/13/2017   Procedure: REVERSE RIGHT SHOULDER ARTHROPLASTY;  Surgeon: [name], MD;  Location: [facility];  Service: Orthopedics;  Laterality: Right;   RIGHT/LEFT HEART CATH AND CORONARY ANGIOGRAPHY N/A 09/06/2020   Procedure: RIGHT/LEFT HEART CATH AND CORONARY ANGIOGRAPHY;  Surgeon: [name], MD;  Location: [facility];  Service: Cardiovascular;  Laterality: N/A;   ROTATOR CUFF REPAIR     right   SPINE SURGERY  1995   [place]   TOTAL ABDOMINAL HYSTERECTOMY W/ BILATERAL SALPINGOOPHORECTOMY  5053   UMBILICAL HERNIA REPAIR  Aug 11, 2015    Home Medications:  Allergies as of 02/13/2021       Reactions   Demeclocycline Hives   Erythromycin Nausea And Vomiting, Other (See Comments)   Severe irritable bowel   Flagyl [metronidazole] Nausea And Vomiting, Other (See Comments)   Severe irritable bowel   Glucophage [metformin Hcl] Nausea And Vomiting, Other (See Comments)   "Sick" "I won't take anything that has metformin in it"   Tetracyclines & Related Hives, Rash   Diovan [valsartan] Nausea Only       Sulfa Antibiotics Rash, Other (See Comments)   As child   Xanax [alprazolam] Other (See Comments)   Unknown reaction        Medication List        Accurate as of February 13, 2021  2:55 PM. If you have any questions, ask your nurse or doctor.          albuterol 108 (90 Base) MCG/ACT inhaler Commonly known as: VENTOLIN HFA Inhale 2 puffs into the lungs every 8 (eight) hours as needed for wheezing or shortness of breath.   Anoro Ellipta 62.5-25 MCG/INH Aepb Generic drug: umeclidinium-vilanterol Inhale 1 puff into the lungs daily.   aspirin EC 81 MG tablet Take 1 tablet (81 mg total) by mouth daily.   atorvastatin 40 MG tablet Commonly known as: LIPITOR Take 1 tablet (40 mg total) by mouth daily.   clotrimazole-betamethasone cream Commonly known as: LOTRISONE Apply 1 application topically 2 (two) times daily as needed.   cyanocobalamin 1000 MCG/ML  injection Commonly known as: (VITAMIN B-12) Inject 1 mL (1,000 mcg total) into the muscle every 30 (thirty) days. Inject 1 ml (1000 mcg ) IM weekly x 4,  Then monthly thereafter   dapagliflozin propanediol 10 MG Tabs tablet Commonly known as: FARXIGA Take 10 mg by mouth daily.   diphenoxylate-atropine 2.5-0.025 MG tablet Commonly known as: Lomotil Take 1 tablet by mouth 4 (four) times daily as needed for diarrhea or loose stools.   Droplet Pen Needles 31G X 5 MM Misc Generic drug: Insulin Pen Needle USE ONE NEEDLE SUBCUTANEOUSLY AS DIRECTED (REMOVE AND DISCARD NEEDLE IN SHARPS CONTAINER IMMEDIATELY AFTER USE)   fexofenadine 180 MG tablet Commonly  known as: ALLEGRA Take 180 mg by mouth daily.   FreeStyle Libre 2 Reader [employer] Use to check sugar at least TID   Klor-Con M20 20 MEQ tablet Generic drug: potassium chloride SA TAKE 1 TABLET BY MOUTH EVERY DAY   Lantus SoloStar 100 UNIT/ML Solostar Pen Generic drug: insulin glargine Inject 15 Units into the skin daily.   metoprolol tartrate 25 MG tablet Commonly known as: LOPRESSOR Take 1 tablet (25 mg total) by mouth daily.   nitroGLYCERIN 0.4 MG SL tablet Commonly known as: NITROSTAT Place 1 tablet (0.4 mg total) under the tongue every 5 (five) minutes as needed for chest pain.   NONFORMULARY OR COMPOUNDED [place]  Combination Pain Cream -  Baclofen 2%, Doxepin 5%, Gabapentin 6%, Topiramate 2%, Pentoxifylline 3% Apply 1-2 grams to affected area 3-4 times daily Qty. 120 gm 3 refills   NovoLOG FlexPen 100 UNIT/ML FlexPen Generic drug: insulin aspart Inject 8-16 units up to 3 times daily before meals   nystatin powder Commonly known as: MYCOSTATIN/NYSTOP Apply 1 g topically 4 (four) times daily as needed (rash).   Ozempic (0.25 or 0.5 MG/DOSE) 2 MG/1.5ML Sopn Generic drug: Semaglutide(0.25 or 0.5MG /DOS) Inject 0.5 mg into the skin once a week.   pregabalin 50 MG capsule Commonly known as: LYRICA TAKE 1  CAPSULE BY MOUTH 2 TIMES DAILY.   torsemide 10 MG tablet Commonly known as: DEMADEX Take 1 tablet (10 mg total) by mouth daily.        Allergies:  Allergies  Allergen Reactions   Demeclocycline Hives   Erythromycin Nausea And Vomiting and Other (See Comments)    Severe irritable bowel   Flagyl [Metronidazole] Nausea And Vomiting and Other (See Comments)    Severe irritable bowel   Glucophage [Metformin Hcl] Nausea And Vomiting and Other (See Comments)    "Sick" "I won't take anything that has metformin in it"   Tetracyclines & Related Hives and Rash   Diovan [Valsartan] Nausea Only        Sulfa Antibiotics Rash and Other (See Comments)    As child   Xanax [Alprazolam] Other (See Comments)    Unknown reaction    Family History: Family History  Problem Relation Age of Onset   Heart disease Father    Hypertension Father    Prostate cancer Father    Stroke Father    Osteoporosis Father    Stroke Mother    Depression Mother    Headache Mother    Heart disease Mother    Thyroid disease Mother    Hypertension Mother    Diabetes Daughter    Heart disease Daughter    Hypertension Daughter    Hypertension Son     Social History:  reports that she has never smoked. She has never used smokeless tobacco. She reports current drug use. She reports that she does not drink alcohol.  ROS:                                        Physical Exam: BP 111/74   Pulse 67   Ht 5\' 4"  (1.626 m)   Wt 110.7 kg   BMI 41.88 kg/m   Constitutional:  Alert and oriented, No acute distress. HEENT: [place] AT, moist mucus membranes.  Trachea midline, no masses. Cardiovascular: No clubbing, cyanosis, or edema. Respiratory: Normal respiratory effort, no increased work of breathing. GI: Abdomen is soft,  nontender, nondistended, no abdominal masses GU: Obesity in a wheelchair Skin: No rashes, bruises or suspicious lesions. Lymph: No cervical or inguinal  adenopathy. Neurologic: Grossly intact, no focal deficits, moving all 4 extremities. Psychiatric: Normal mood and affect.  Laboratory Data: Lab Results  Component Value Date   WBC 9.2 02/09/2021   HGB 13.2 02/09/2021   HCT 39.9 02/09/2021   MCV 95.0 02/09/2021   PLT 238 02/09/2021    Lab Results  Component Value Date   CREATININE 1.34 (H) 02/10/2021    No results found for: PSA  No results found for: TESTOSTERONE  Lab Results  Component Value Date   HGBA1C 8.9 (H) 02/08/2021    Urinalysis    Component Value Date/Time   COLORURINE YELLOW (A) 02/08/2021 1323   APPEARANCEUR CLEAR (A) 02/08/2021 1323   APPEARANCEUR Clear 11/11/2014 1602   LABSPEC 1.034 (H) 02/08/2021 1323   LABSPEC 1.006 06/21/2014 1623   PHURINE 7.0 02/08/2021 1323   GLUCOSEU 150 (A) 02/08/2021 1323   GLUCOSEU >=1000 (A) 11/15/2020 1622   HGBUR NEGATIVE 02/08/2021 1323   BILIRUBINUR NEGATIVE 02/08/2021 1323   BILIRUBINUR 2+ 04/25/2018 1523   BILIRUBINUR Negative 11/11/2014 1602   BILIRUBINUR Negative 06/21/2014 1623   KETONESUR 20 (A) 02/08/2021 1323   PROTEINUR NEGATIVE 02/08/2021 1323   UROBILINOGEN 0.2 11/15/2020 1622   NITRITE NEGATIVE 02/08/2021 1323   LEUKOCYTESUR NEGATIVE 02/08/2021 1323   LEUKOCYTESUR Negative 06/21/2014 1623    Pertinent Imaging: Chart reviewed  Assessment & Plan: Reassess with cystoscopy for hematuria.  InterStim working beautifully and does not need replacement and with this was discussed.  My travel schedule in clinic schedule is very hectic this month and the patient may have cystoscopy by one of my partners next week to accommodate the patient.  I suspect it will be normal  1. Hematuria, unspecified type  - Urinalysis, Complete   No follow-ups on file.  Reece Packer, MD  [facility] [address], [place], [place] [address] [phone]

## 2021-02-13 NOTE — Telephone Encounter (Signed)
Patient returned Brock's phone call.

## 2021-02-14 ENCOUNTER — Telehealth: Payer: Self-pay

## 2021-02-14 DIAGNOSIS — N183 Chronic kidney disease, stage 3 unspecified: Secondary | ICD-10-CM | POA: Diagnosis not present

## 2021-02-14 DIAGNOSIS — E1142 Type 2 diabetes mellitus with diabetic polyneuropathy: Secondary | ICD-10-CM | POA: Diagnosis not present

## 2021-02-14 DIAGNOSIS — E11610 Type 2 diabetes mellitus with diabetic neuropathic arthropathy: Secondary | ICD-10-CM | POA: Diagnosis not present

## 2021-02-14 DIAGNOSIS — E1122 Type 2 diabetes mellitus with diabetic chronic kidney disease: Secondary | ICD-10-CM | POA: Diagnosis not present

## 2021-02-14 DIAGNOSIS — I110 Hypertensive heart disease with heart failure: Secondary | ICD-10-CM | POA: Diagnosis not present

## 2021-02-14 DIAGNOSIS — I5030 Unspecified diastolic (congestive) heart failure: Secondary | ICD-10-CM | POA: Diagnosis not present

## 2021-02-14 NOTE — Telephone Encounter (Signed)
Spoken to patient , she stated everything that was said is wrong. She wanted a appointment to fix what the hospital did with her medication. She has cardiologist appointment tomorrow. She wants to have a FU to discuss and fix her medication. She feels she needs to be seen sooner than 03-01-21. There are no soon available appointments. She wants a call back. Please advise.

## 2021-02-14 NOTE — Telephone Encounter (Signed)
Transition Care Management Unsuccessful Follow-up Telephone Call  Date of discharge and from where:  02/10/21 from [facility]  Attempts:  1st Attempt  Reason for unsuccessful TCM follow-up call:  Unable to reach patient

## 2021-02-14 NOTE — Telephone Encounter (Signed)
Prior to note below, attempted to call pt, unable to reach pt, phone was disconnected.  Per msg below, pt states no further call back needed, she will discuss at [facility] tomorrow 02/15/21.

## 2021-02-14 NOTE — Telephone Encounter (Signed)
Patient returning call and states she will discuss at [facility] tomorrow.  No need for a call back.

## 2021-02-14 NOTE — Telephone Encounter (Signed)
Pt would like to be seen sooner than her appt on 03/01/2021. Would it be okay to see if some one that is coming in for a follow be moved out or is there somewhere that you would like to work her in?

## 2021-02-14 NOTE — Telephone Encounter (Signed)
Pt has been scheduled for this Friday at 8:30am. Pt is aware of appt date and time.

## 2021-02-15 ENCOUNTER — Ambulatory Visit: Payer: Medicare Other | Admitting: Urology

## 2021-02-15 ENCOUNTER — Ambulatory Visit (INDEPENDENT_AMBULATORY_CARE_PROVIDER_SITE_OTHER): Payer: Medicare Other | Admitting: Internal Medicine

## 2021-02-15 ENCOUNTER — Other Ambulatory Visit: Payer: Self-pay

## 2021-02-15 VITALS — BP 106/64 | HR 78 | Ht 64.5 in | Wt 237.0 lb

## 2021-02-15 DIAGNOSIS — I2510 Atherosclerotic heart disease of native coronary artery without angina pectoris: Secondary | ICD-10-CM

## 2021-02-15 DIAGNOSIS — E1159 Type 2 diabetes mellitus with other circulatory complications: Secondary | ICD-10-CM

## 2021-02-15 DIAGNOSIS — I5032 Chronic diastolic (congestive) heart failure: Secondary | ICD-10-CM | POA: Diagnosis not present

## 2021-02-15 DIAGNOSIS — Z794 Long term (current) use of insulin: Secondary | ICD-10-CM | POA: Diagnosis not present

## 2021-02-15 DIAGNOSIS — W19XXXS Unspecified fall, sequela: Secondary | ICD-10-CM

## 2021-02-15 DIAGNOSIS — E785 Hyperlipidemia, unspecified: Secondary | ICD-10-CM | POA: Diagnosis not present

## 2021-02-15 DIAGNOSIS — R296 Repeated falls: Secondary | ICD-10-CM | POA: Diagnosis not present

## 2021-02-15 DIAGNOSIS — I10 Essential (primary) hypertension: Secondary | ICD-10-CM

## 2021-02-15 MED ORDER — FREESTYLE LIBRE 2 SENSOR MISC: 1.0000 "application " | 0 refills | Status: DC

## 2021-02-15 NOTE — Progress Notes (Signed)
Follow-up Outpatient Visit Date: 02/15/2021  Primary Care Provider: [name], MD [address] [place] [address]  Chief Complaint: Multiple complaints  HPI:  Denise Sharp is a 75 y.o. female with history of CAD status post PCI to the ostial and mid LAD in the setting of unstable angina in 12/2019, chronic HFpEF, hypertension, type 2 diabetes mellitus, iron deficiency anemia following gastric bypass x2, irritable bowel syndrome, esophageal stenosis, chronic dizziness with gait instability, cervicogenic headache with cervical spine stenosis, obstructive sleep apnea on CPAP, restless leg syndrome, and adrenal mass, who presents for follow-up of chronic HFpEF and coronary artery disease.  I last saw her in early June at which time she continued to complain of marked fatigue and shortness of breath.  She was subsequently seen by Dr. Haroldine Laws in the heart failure clinic, who felt that her symptoms were largely driven by her morbid obesity and deconditioning.  Weight loss was recommended.  She was seen by Cadence Kathlen Mody, PA, last month, at which time she reported being exhausted and also noted some soft blood pressure readings at home.  At follow-up visit a week later, her weights were actually going up and she continued to feel short of breath.  Preceding pharmacologic myocardial perfusion stress test showed no evidence of ischemia.  She was hospitalized last month with frequent falls including a large ecchymosis overlying the low back after falling in the bath/shower.  Today, [name] has multiple complaints including generalized weakness and continued low back and right thigh pain after her recent falls.  She now has home health aides assisting her around-the-clock.  Prior to her fall, she was actually starting to feel little better with less shortness of breath.  Unfortunately, she feels like her energy has worsened.  Leg edema is stable.  She denies chest pain and  palpitations.  She has felt lightheaded ever since coming home from the hospital despite de-escalation of her blood pressure medications.  --------------------------------------------------------------------------------------------------  Past Medical History:  Diagnosis Date   (HFpEF) heart failure with preserved ejection fraction ([place])    a. 12/2019 Echo: EF 65-70%, Gr2 DD. No significant valvular dzs.   Abnormality of gait 03/25/2013   Adrenal mass, left ([place])    Anemia    iron deficient post  2 unit txfsn 2009, normal endo/colonoscopy by [facility]   Arthritis    CAD (coronary artery disease) with h/o Atypical Chest Pain    a. 04/1986 Cath ([facility]): nl cors, EF 65%; b. 07/2012 Cath [facility]): Diff minor irregs; c. 07/2016 MV [facility]): "Equivocal"; d. 08/2016 Cardiac CT Ca2+ score [facility]): Ca2+ 8416; e. 05/2019 MV: No ischemia. EF 75%; f. 12/2019 PCI: LM nl, LAD 65ost (3.5x12 Resolute DES), 57m (2.75x12 Resolute DES),LCX/RCA nl; g. 08/2020 Cath: patent LAD stents, RCA 40ost, elev RH pressures, EF >65%.   Cervical spinal stenosis 1994   due to trauma to back ([employer] accident), has intermittent paralysis and parasthesias   Cervicogenic headache 03/23/2014   Depression    Dizziness    chronic dizziness   DJD (degenerative joint disease)    a. Chronic R shoulder pain pending R shoulder replacement 07/2017.   Esophageal stenosis March 2011   with transietn outlet obstruction by food, cleared by EGD    Family history of adverse reaction to anesthesia    daughter PONV   Gastric bypass status for obesity    Headache(784.0)    Hypertension    IBS (irritable bowel syndrome)    Left bundle branch block (  LBBB)    a. Intermittently present - likely rate related. - patient denies   Obesity    Obstructive sleep apnea    using CPAP   Polyneuropathy in diabetes(357.2) 03/25/2013   Restless leg syndrome    Rotator cuff arthropathy, right 08/13/2017   Syncope and collapse 03/12/2014   Type II diabetes mellitus ([place])     Past Surgical History:  Procedure Laterality Date   ABDOMINAL HYSTERECTOMY     APPENDECTOMY     CARDIAC CATHETERIZATION     COLONOSCOPY     CORONARY STENT INTERVENTION N/A 01/04/2020   Procedure: CORONARY STENT INTERVENTION;  Surgeon: [name], MD;  Location: [facility];  Service: Cardiovascular;  Laterality: N/A;  LAD    DIAPHRAGMATIC HERNIA REPAIR  2015   ESOPHAGEAL DILATION     multiple   ESOPHAGOGASTRODUODENOSCOPY     GALLBLADDER SURGERY  1976   GALLBLADDER SURGERY  resection   GASTRIC BYPASS     GASTRIC BYPASS  2000, 2005   Dr. [name] IMPLANT PLACEMENT  April 2013   [facility] REPLACEMENT  2007   bilateral knee. [name],  [name]   LEFT HEART CATH AND CORS/GRAFTS ANGIOGRAPHY N/A 12/31/2019   Procedure: LEFT HEART CATH AND CORS/GRAFTS ANGIOGRAPHY;  Surgeon: [name], MD;  Location: [facility];  Service: Cardiovascular;  Laterality: N/A;   PANNICULECTOMY  06/16/2019   PANNICULECTOMY N/A 06/16/2019   Procedure: PANNICULECTOMY;  Surgeon: [name], MD;  Location: [facility];  Service: Plastics;  Laterality: N/A;  3 hours, please   REVERSE SHOULDER ARTHROPLASTY Right 08/13/2017   Procedure: REVERSE RIGHT SHOULDER ARTHROPLASTY;  Surgeon: [name], MD;  Location: [facility];  Service: Orthopedics;  Laterality: Right;   RIGHT/LEFT HEART CATH AND CORONARY ANGIOGRAPHY N/A 09/06/2020   Procedure: RIGHT/LEFT HEART CATH AND CORONARY ANGIOGRAPHY;  Surgeon: [name], MD;  Location: [facility];  Service: Cardiovascular;  Laterality: N/A;   ROTATOR CUFF REPAIR     right   SPINE SURGERY  1995   [place]   TOTAL ABDOMINAL HYSTERECTOMY W/ BILATERAL SALPINGOOPHORECTOMY  6256   UMBILICAL HERNIA REPAIR  Aug 11, 2015     Current Meds  Medication Sig   albuterol (VENTOLIN HFA) 108 (90 Base) MCG/ACT inhaler Inhale 2 puffs into the lungs every 8 (eight) hours as needed for wheezing or shortness of breath.   aspirin EC 81 MG  tablet Take 1 tablet (81 mg total) by mouth daily.   atorvastatin (LIPITOR) 40 MG tablet Take 1 tablet (40 mg total) by mouth daily.   clotrimazole-betamethasone (LOTRISONE) cream Apply 1 application topically 2 (two) times daily as needed.   Continuous Blood Gluc Receiver (FREESTYLE LIBRE 2 READER) DEVI Use to check sugar at least TID   Continuous Blood Gluc Sensor (FREESTYLE LIBRE 2 SENSOR) MISC 1 application by Does not apply route every 14 (fourteen) days.   cyanocobalamin (,VITAMIN B-12,) 1000 MCG/ML injection Inject 1 mL (1,000 mcg total) into the muscle every 30 (thirty) days. Inject 1 ml (1000 mcg ) IM weekly x 4,  Then monthly thereafter   dapagliflozin propanediol (FARXIGA) 10 MG TABS tablet Take 10 mg by mouth daily.   diphenoxylate-atropine (LOMOTIL) 2.5-0.025 MG tablet Take 1 tablet by mouth 4 (four) times daily as needed for diarrhea or loose stools.   fexofenadine (ALLEGRA) 180 MG tablet Take 180 mg by mouth daily.   insulin aspart (NOVOLOG FLEXPEN) 100 UNIT/ML FlexPen Inject 8-16 units up to  3 times daily before meals   insulin glargine (LANTUS SOLOSTAR) 100 UNIT/ML Solostar Pen Inject 15 Units into the skin daily.   Insulin Pen Needle (DROPLET PEN NEEDLES) 31G X 5 MM MISC USE ONE NEEDLE SUBCUTANEOUSLY AS DIRECTED (REMOVE AND DISCARD NEEDLE IN SHARPS CONTAINER IMMEDIATELY AFTER USE)   KLOR-CON M20 20 MEQ tablet TAKE 1 TABLET BY MOUTH EVERY DAY   Lidocaine 4 % PTCH Apply topically daily.   nitroGLYCERIN (NITROSTAT) 0.4 MG SL tablet Place 1 tablet (0.4 mg total) under the tongue every 5 (five) minutes as needed for chest pain.   NONFORMULARY OR COMPOUNDED [employer]  Combination Pain Cream -  Baclofen 2%, Doxepin 5%, Gabapentin 6%, Topiramate 2%, Pentoxifylline 3% Apply 1-2 grams to affected area 3-4 times daily Qty. 120 gm 3 refills   nystatin (MYCOSTATIN/NYSTOP) powder Apply 1 g topically 4 (four) times daily as needed (rash).   pregabalin (LYRICA) 50 MG capsule Take  50 mg by mouth daily.   Semaglutide,0.25 or 0.5MG /DOS, (OZEMPIC, 0.25 OR 0.5 MG/DOSE,) 2 MG/1.5ML SOPN Inject 0.5 mg into the skin once a week.   torsemide (DEMADEX) 10 MG tablet Take 1 tablet (10 mg total) by mouth daily.   umeclidinium-vilanterol (ANORO ELLIPTA) 62.5-25 MCG/INH AEPB Inhale 1 puff into the lungs daily.   [DISCONTINUED] metoprolol tartrate (LOPRESSOR) 25 MG tablet Take 1 tablet (25 mg total) by mouth daily.    Allergies: Demeclocycline, Erythromycin, Flagyl [metronidazole], Glucophage [metformin hcl], Tetracyclines & related, Diovan [valsartan], Sulfa antibiotics, and Xanax [alprazolam]  Social History   Tobacco Use   Smoking status: Never   Smokeless tobacco: Never  Vaping Use   Vaping Use: Never used  Substance Use Topics   Alcohol use: No   Drug use: Yes    Comment: prescribed valium    Family History  Problem Relation Age of Onset   Heart disease Father    Hypertension Father    Prostate cancer Father    Stroke Father    Osteoporosis Father    Stroke Mother    Depression Mother    Headache Mother    Heart disease Mother    Thyroid disease Mother    Hypertension Mother    Diabetes Daughter    Heart disease Daughter    Hypertension Daughter    Hypertension Son     Review of Systems: A 12-system review of systems was performed and was negative except as noted in the HPI.  --------------------------------------------------------------------------------------------------  Physical Exam: BP 106/64 (BP Location: Right Arm, Patient Position: Sitting, Cuff Size: Normal)   Pulse 78   Ht 5' 4.5" (1.638 m)   Wt 237 lb (107.5 kg)   SpO2 98%   BMI 40.05 kg/m   General: Seated in a wheelchair and accompanied by her husband. Neck: No gross JVD or HJR, though body habitus limits evaluation. Lungs: Mildly diminished breath sounds throughout without wheezes or crackles. Heart: Distant heart sounds.  Regular rate and rhythm without murmurs, rubs, or  gallops. Abdomen: Soft, nontender, nondistended. Extremities: No lower extremity edema.  EKG:  Normal sinus rhythm without abnormality.  Lab Results  Component Value Date   WBC 9.2 02/09/2021   HGB 13.2 02/09/2021   HCT 39.9 02/09/2021   MCV 95.0 02/09/2021   PLT 238 02/09/2021    Lab Results  Component Value Date   NA 140 02/10/2021   K 3.9 02/10/2021   CL 103 02/10/2021   CO2 29 02/10/2021   BUN 23 02/10/2021   CREATININE 1.34 (H) 02/10/2021  GLUCOSE 119 (H) 02/10/2021   ALT 20 02/08/2021    Lab Results  Component Value Date   CHOL 134 05/24/2020   HDL 60.40 05/24/2020   LDLCALC 48 05/24/2020   LDLDIRECT 111.0 06/16/2015   TRIG 128.0 05/24/2020   CHOLHDL 2 05/24/2020    --------------------------------------------------------------------------------------------------  ASSESSMENT AND PLAN: Coronary artery disease without angina: Ms. Jalloh has not had any further chest pain.  She still has chronic fatigue that is likely multifactorial and exacerbated by her recent fall and hospitalization.  Myocardial perfusion stress test last month was low risk without ischemia or scar.  We will continue current medications for secondary prevention including aspirin and atorvastatin.  We will discontinue metoprolol in the setting of soft blood pressure and lightheadedness.  Chronic HFpEF: Ms. Sypher continues to have NYHA class III symptoms though she appears largely euvolemic.  I agree with Dr. Haroldine Laws that deconditioning and morbid obesity are likely the driving forces behind Ms. Mordecai's symptoms.  We will discontinue metoprolol given soft blood pressure and lightheadedness.  Ms. Uyeno should continue torsemide 10 mg daily and Farxiga.  Hyperlipidemia: LDL well controlled on last check in 05/2020.  Continue atorvastatin 40 mg daily.  DM: Ms. Heeg requests two Waneta Martins sensors, as she has had to remove them prematurely for testing.  Prescription for two  sensors sent today.  Ongoing management of DM per [name].  Falls: Continue to work with physical therapy.  24-hour home health assistance now available.  [name] is scheduled for follow-up with [name] tomorrow.  Morbid obesity: Weight loss encouraged through diet and exercise.  Follow-up: Return to clinic in 1 month with APP.  [name], MD 02/16/2021 4:39 PM

## 2021-02-15 NOTE — Patient Instructions (Signed)
Medication Instructions:   Your physician has recommended you make the following change in your medication:   STOP Metoprolol   *If you need a refill on your cardiac medications before your next appointment, please call your pharmacy*   Lab Work:  None ordered  Testing/Procedures:  None ordered   Follow-Up: At [facility], you and your health needs are our priority.  As part of our continuing mission to provide you with exceptional heart care, we have created designated Provider Care Teams.  These Care Teams include your primary Cardiologist (physician) and Advanced Practice Providers (APPs -  Physician Assistants and Nurse Practitioners) who all work together to provide you with the care you need, when you need it.  We recommend signing up for the patient portal called "MyChart".  Sign up information is provided on this After Visit Summary.  MyChart is used to connect with patients for Virtual Visits (Telemedicine).  Patients are able to view lab/test results, encounter notes, upcoming appointments, etc.  Non-urgent messages can be sent to your provider as well.   To learn more about what you can do with MyChart, go to [email or web address].    Your next appointment:   1 month(s)  The format for your next appointment:   In Person  Provider:    You will see one of the following Advanced Practice Providers on your designated Care Team:   Murray Hodgkins, NP Christell Faith, PA-C Marrianne Mood, PA-C Cadence Saybrook, [place]

## 2021-02-16 ENCOUNTER — Ambulatory Visit
Admission: RE | Admit: 2021-02-16 | Discharge: 2021-02-16 | Disposition: A | Discharge status: Home / Self Care | Payer: Medicare Other | Source: Ambulatory Visit | Attending: Nephrology | Admitting: Nephrology

## 2021-02-16 ENCOUNTER — Encounter: Payer: Self-pay | Admitting: Internal Medicine

## 2021-02-16 DIAGNOSIS — E119 Type 2 diabetes mellitus without complications: Secondary | ICD-10-CM | POA: Insufficient documentation

## 2021-02-16 DIAGNOSIS — N1831 Chronic kidney disease, stage 3a: Secondary | ICD-10-CM | POA: Diagnosis not present

## 2021-02-16 DIAGNOSIS — N183 Chronic kidney disease, stage 3 unspecified: Secondary | ICD-10-CM | POA: Diagnosis not present

## 2021-02-16 DIAGNOSIS — W19XXXA Unspecified fall, initial encounter: Secondary | ICD-10-CM | POA: Insufficient documentation

## 2021-02-16 DIAGNOSIS — N261 Atrophy of kidney (terminal): Secondary | ICD-10-CM | POA: Diagnosis not present

## 2021-02-16 NOTE — Telephone Encounter (Signed)
Transition Care Management Unsuccessful Follow-up Telephone Call  Date of discharge and from where:  02/11/21 from [facility]  Attempts:  2nd Attempt  Reason for unsuccessful TCM follow-up call:  Left voice message

## 2021-02-17 ENCOUNTER — Ambulatory Visit (INDEPENDENT_AMBULATORY_CARE_PROVIDER_SITE_OTHER): Payer: Medicare Other | Admitting: Internal Medicine

## 2021-02-17 ENCOUNTER — Encounter: Payer: Self-pay | Admitting: Internal Medicine

## 2021-02-17 ENCOUNTER — Other Ambulatory Visit: Payer: Self-pay

## 2021-02-17 VITALS — BP 116/72 | HR 88 | Temp 98.8°F | Ht 64.5 in | Wt 232.8 lb

## 2021-02-17 DIAGNOSIS — G8911 Acute pain due to trauma: Secondary | ICD-10-CM

## 2021-02-17 DIAGNOSIS — E1122 Type 2 diabetes mellitus with diabetic chronic kidney disease: Secondary | ICD-10-CM | POA: Diagnosis not present

## 2021-02-17 DIAGNOSIS — I25119 Atherosclerotic heart disease of native coronary artery with unspecified angina pectoris: Secondary | ICD-10-CM | POA: Diagnosis not present

## 2021-02-17 DIAGNOSIS — I5030 Unspecified diastolic (congestive) heart failure: Secondary | ICD-10-CM | POA: Diagnosis not present

## 2021-02-17 DIAGNOSIS — R0600 Dyspnea, unspecified: Secondary | ICD-10-CM | POA: Diagnosis not present

## 2021-02-17 DIAGNOSIS — N183 Chronic kidney disease, stage 3 unspecified: Secondary | ICD-10-CM | POA: Diagnosis not present

## 2021-02-17 DIAGNOSIS — E1140 Type 2 diabetes mellitus with diabetic neuropathy, unspecified: Secondary | ICD-10-CM

## 2021-02-17 DIAGNOSIS — I872 Venous insufficiency (chronic) (peripheral): Secondary | ICD-10-CM

## 2021-02-17 DIAGNOSIS — E1142 Type 2 diabetes mellitus with diabetic polyneuropathy: Secondary | ICD-10-CM | POA: Diagnosis not present

## 2021-02-17 DIAGNOSIS — R0609 Other forms of dyspnea: Secondary | ICD-10-CM

## 2021-02-17 DIAGNOSIS — J209 Acute bronchitis, unspecified: Secondary | ICD-10-CM | POA: Diagnosis not present

## 2021-02-17 DIAGNOSIS — R300 Dysuria: Secondary | ICD-10-CM | POA: Diagnosis not present

## 2021-02-17 DIAGNOSIS — I110 Hypertensive heart disease with heart failure: Secondary | ICD-10-CM | POA: Diagnosis not present

## 2021-02-17 DIAGNOSIS — E11610 Type 2 diabetes mellitus with diabetic neuropathic arthropathy: Secondary | ICD-10-CM | POA: Diagnosis not present

## 2021-02-17 LAB — URINALYSIS, ROUTINE W REFLEX MICROSCOPIC
Bilirubin Urine: NEGATIVE
Hgb urine dipstick: NEGATIVE
Ketones, ur: NEGATIVE
Nitrite: NEGATIVE
Specific Gravity, Urine: >=1.03 — AB (ref 1.000–1.030)
Urine Glucose: 500 — AB
Urobilinogen, UA: 0.2 (ref 0.0–1.0)
pH: 5 (ref 5.0–8.0)

## 2021-02-17 MED ORDER — ONDANSETRON 4 MG PO TBDP: 4.0000 mg | ORAL_TABLET | Freq: Three times a day (TID) | ORAL | 0 refills | Status: DC | PRN

## 2021-02-17 MED ORDER — NOVOLOG FLEXPEN 100 UNIT/ML ~~LOC~~ SOPN: PEN_INJECTOR | SUBCUTANEOUS | 3 refills | Status: DC

## 2021-02-17 MED ORDER — DIPHENOXYLATE-ATROPINE 2.5-0.025 MG PO TABS: 1.0000 | ORAL_TABLET | Freq: Four times a day (QID) | ORAL | 5 refills | Status: DC | PRN

## 2021-02-17 MED ORDER — ALBUTEROL SULFATE HFA 108 (90 BASE) MCG/ACT IN AERS: 2.0000 | INHALATION_SPRAY | Freq: Three times a day (TID) | RESPIRATORY_TRACT | 2 refills | Status: DC | PRN

## 2021-02-17 MED ORDER — ATORVASTATIN CALCIUM 40 MG PO TABS: 40.0000 mg | ORAL_TABLET | Freq: Every day | ORAL | 0 refills | Status: DC

## 2021-02-17 MED ORDER — CYANOCOBALAMIN 1000 MCG/ML IJ SOLN: 1000.0000 ug | INTRAMUSCULAR | 1 refills | Status: DC

## 2021-02-17 MED ORDER — TRAMADOL HCL 50 MG PO TABS: 50.0000 mg | ORAL_TABLET | Freq: Three times a day (TID) | ORAL | 0 refills | Status: AC | PRN

## 2021-02-17 MED ORDER — LANTUS SOLOSTAR 100 UNIT/ML ~~LOC~~ SOPN
15.0000 [IU] | PEN_INJECTOR | Freq: Every day | SUBCUTANEOUS | 3 refills | Status: DC
Start: 2021-02-17 — End: 2021-03-14

## 2021-02-17 MED ORDER — DROPLET PEN NEEDLES 31G X 5 MM MISC
1 refills | Status: DC
Start: 2021-02-17 — End: 2022-02-12

## 2021-02-17 MED ORDER — ANORO ELLIPTA 62.5-25 MCG/INH IN AEPB
1.0000 | INHALATION_SPRAY | Freq: Every day | RESPIRATORY_TRACT | 3 refills | Status: DC
Start: 2021-02-17 — End: 2021-03-09

## 2021-02-17 MED ORDER — OZEMPIC (0.25 OR 0.5 MG/DOSE) 2 MG/1.5ML ~~LOC~~ SOPN: 0.5000 mg | PEN_INJECTOR | SUBCUTANEOUS | 2 refills | Status: DC

## 2021-02-17 MED ORDER — FREESTYLE LIBRE 2 SENSOR MISC: 1.0000 "application " | 0 refills | Status: DC

## 2021-02-17 NOTE — Patient Instructions (Addendum)
You can resume tramadol for pain management ,  it should not be taken within 4 hours of the lyrica.  Do not use benadryl anymore. Use one of the new antihistamines for itching and for allergies:  claritin,  allegra or zyrtec  We will rule out a UTI  with a urinalysis and culture

## 2021-02-17 NOTE — Progress Notes (Signed)
Subjective:  Patient ID: Denise Sharp, female    DOB: Aug 21, 1945  Age: 75 y.o. MRN: [id number]  CC: The primary encounter diagnosis was Dysuria. 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  HPI Denise Sharp presents for  management of persistent back and  right leg pain   This visit occurred during the SARS-CoV-2 public health emergency.  Safety protocols were in place, including screening questions prior to the visit, additional usage of staff PPE, and extensive cleaning of exam room while observing appropriate contact time as indicated for disinfecting solutions.   She describes sharp pain from right buttock to right thigh.   The pain started after a fall in shower ,   occurred on July 25 ,when she  used right leg to brace herself from falling but struck back on  the WAY DOWN.  Back was bruised , pain was severe and she was taken to  ER .   She was admitted for pain management.  CT of cervical and lumbar spine,  abd and pelvis done.  No acute injuries.  Was given  toradol during admission to hospital for pain management .  Told not to combine lyrica ,  tramadol and benadryl because of sedation and hypotension so husband threw out her tramadol.   metoprolol also stopped.   She reports  that her pain has been  so severe without the tramadol that she has not been able to eat or drink due to persistnet nausea.  She Has lost 12 lbs since her last visit. PT is scheduled but she does not feel she can tolerate it without additional  analgesics.  She Takes lyrica in the morning only.  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    Outpatient Medications Prior to Visit  Medication Sig Dispense Refill   aspirin EC 81 MG tablet Take 1 tablet (81 mg total) by mouth daily. 30 tablet 2   clotrimazole-betamethasone (LOTRISONE) cream Apply 1 application topically 2 (two) times daily as needed.     Continuous Blood Gluc Receiver (FREESTYLE LIBRE 2 READER) DEVI Use to check sugar at least TID 1 each 1   dapagliflozin propanediol (FARXIGA) 10 MG TABS tablet Take 10 mg by mouth daily.     fexofenadine (ALLEGRA) 180 MG tablet Take 180 mg by mouth daily.     KLOR-CON M20 20 MEQ tablet TAKE 1 TABLET BY MOUTH EVERY DAY 90 tablet 0   Lidocaine 4 % PTCH Apply topically daily.     nitroGLYCERIN (NITROSTAT) 0.4 MG SL tablet Place 1 tablet (0.4 mg total) under the tongue every 5 (five) minutes as needed for chest pain. 25 tablet 1   NONFORMULARY OR COMPOUNDED ITEM [facility]  Combination Pain Cream -  Baclofen 2%, Doxepin 5%, Gabapentin 6%, Topiramate 2%, Pentoxifylline 3% Apply 1-2 grams to affected area 3-4 times daily Qty. 120 gm 3 refills 1 each 3   nystatin (MYCOSTATIN/NYSTOP) powder Apply 1 g topically 4 (four) times daily as needed (rash).     pregabalin (LYRICA) 50 MG capsule Take 50 mg by mouth daily.  torsemide (DEMADEX) 10 MG tablet Take 1 tablet (10 mg total) by mouth daily.     albuterol (VENTOLIN HFA) 108 (90 Base) MCG/ACT inhaler Inhale 2 puffs into the lungs every 8 (eight) hours as needed for wheezing or shortness of breath. 8 g 2   atorvastatin (LIPITOR) 40 MG tablet Take 1 tablet (40 mg total) by mouth daily. 90 tablet 0   Continuous Blood Gluc Sensor (FREESTYLE LIBRE 2 SENSOR) MISC 1 application by Does not apply route every 14 (fourteen) days. 2 each 0   cyanocobalamin (,VITAMIN B-12,) 1000 MCG/ML injection Inject 1 mL (1,000 mcg total) into the muscle every 30 (thirty) days. Inject 1 ml (1000 mcg ) IM weekly x 4,  Then monthly thereafter 3  mL 1   diphenoxylate-atropine (LOMOTIL) 2.5-0.025 MG tablet Take 1 tablet by mouth 4 (four) times daily as needed for diarrhea or loose stools. 60 tablet 5   insulin aspart (NOVOLOG FLEXPEN) 100 UNIT/ML FlexPen Inject 8-16 units up to 3 times daily before meals 45 mL 3   insulin glargine (LANTUS SOLOSTAR) 100 UNIT/ML Solostar Pen Inject 15 Units into the skin daily. 15 mL 3   Insulin Pen Needle (DROPLET PEN NEEDLES) 31G X 5 MM MISC USE ONE NEEDLE SUBCUTANEOUSLY AS DIRECTED (REMOVE AND DISCARD NEEDLE IN SHARPS CONTAINER IMMEDIATELY AFTER USE) 100 each 1   Semaglutide,0.25 or 0.5MG /DOS, (OZEMPIC, 0.25 OR 0.5 MG/DOSE,) 2 MG/1.5ML SOPN Inject 0.5 mg into the skin once a week. 1.5 mL 2   umeclidinium-vilanterol (ANORO ELLIPTA) 62.5-25 MCG/INH AEPB Inhale 1 puff into the lungs daily. 60 each 3   No facility-administered medications prior to visit.    Review of Systems;  Patient denies headache, fevers, malaise,  skin rash, eye pain, sinus congestion and sinus pain, sore throat, dysphagia,  hemoptysis , cough, dyspnea, wheezing, chest pain, palpitations, orthopnea, edema, abdominal pain,  melena, diarrhea, constipation, flank pain, dysuria, hematuria, urinary  Frequency, nocturia, numbness, tingling, seizures,  Focal weakness, Loss of consciousness,  Tremor, insomnia, depression, anxiety, and suicidal ideation.      Objective:  BP 116/72   Pulse 88   Temp 98.8 F (37.1 C) (Oral)   Ht 5' 4.5" (1.638 m)   Wt 232 lb 12.8 oz (105.6 kg)   SpO2 97%   BMI 39.34 kg/m   BP Readings from Last 3 Encounters:  02/17/21 116/72  02/15/21 106/64  02/13/21 111/74    Wt Readings from Last 3 Encounters:  02/17/21 232 lb 12.8 oz (105.6 kg)  02/15/21 237 lb (107.5 kg)  02/13/21 244 lb (110.7 kg)    General appearance: alert, cooperative and appears stated age Ears: normal TM's and external ear canals both ears Throat: lips, mucosa, and tongue normal; teeth and gums normal Neck: no adenopathy, no  carotid bruit, supple, symmetrical, trachea midline and thyroid not enlarged, symmetric, no tenderness/mass/nodules Back: symmetric, no curvature. ROM normal. No CVA tenderness. Lungs: clear to auscultation bilaterally Heart: regular rate and rhythm, S1, S2 normal, no murmur, click, rub or gallop Abdomen: soft, non-tender; bowel sounds normal; no masses,  no organomegaly Pulses: 2+ and symmetric Skin: Skin color, texture, turgor normal. No rashes or lesions Lymph nodes: Cervical, supraclavicular, and axillary nodes normal.  Lab Results  Component Value Date   HGBA1C 8.9 (H) 02/08/2021   HGBA1C 8.2 (A) 11/15/2020   HGBA1C 8.5 (H) 05/24/2020    Lab Results  Component Value Date   CREATININE 1.34 (H) 02/10/2021   CREATININE 1.47 (H) 02/09/2021   CREATININE 1.48 (H)  02/09/2021    Lab Results  Component Value Date   WBC 9.2 02/09/2021   HGB 13.2 02/09/2021   HCT 39.9 02/09/2021   PLT 238 02/09/2021   GLUCOSE 119 (H) 02/10/2021   CHOL 134 05/24/2020   TRIG 128.0 05/24/2020   HDL 60.40 05/24/2020   LDLDIRECT 111.0 06/16/2015   LDLCALC 48 05/24/2020   ALT 20 02/08/2021   AST 42 (H) 02/08/2021   NA 140 02/10/2021   K 3.9 02/10/2021   CL 103 02/10/2021   CREATININE 1.34 (H) 02/10/2021   BUN 23 02/10/2021   CO2 29 02/10/2021   TSH 2.209 02/08/2021   INR 1.1 12/31/2019   HGBA1C 8.9 (H) 02/08/2021   MICROALBUR 2.8 (H) 06/04/2017    No results found.  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   Acute pain due to injury    She has no fractures from recent fall, which resulted in an admission with CT scans from cervical spine to lumbar spine as well as abd and pelvis  Resume tramadol for pain management as her pain may be due to withdrawal        Other Visit Diagnoses     Dysuria    -  Primary   Relevant Orders   Urinalysis, Routine w reflex microscopic (Completed)   Urine Culture (Completed)   Acute bronchitis, unspecified organism       Relevant Medications   albuterol (VENTOLIN HFA) 108 (90 Base) MCG/ACT inhaler   DM type 2 with diabetic peripheral neuropathy (H[facility])       Relevant Medications   atorvastatin (LIPITOR) 40 MG tablet   insulin glargine (LANTUS SOLOSTAR) 100 UNIT/ML Solostar Pen   insulin aspart (NOVOLOG FLEXPEN) 100 UNIT/ML FlexPen   Semaglutide,0.25 or 0.5MG /DOS, (OZEMPIC, 0.25 OR 0.5 MG/DOSE,) 2 MG/1.5ML SOPN      Meds ordered this encounter  Medications   DISCONTD: ondansetron (ZOFRAN ODT) 4 MG disintegrating tablet    Sig: Take 1 tablet (4 mg total) by mouth every 8 (eight) hours as needed for nausea or vomiting.    Dispense:  20 tablet    Refill:  0   traMADol (ULTRAM) 50 MG tablet    Sig: Take 1 tablet (50 mg total) by mouth every 8 (eight) hours as needed for up to 7 days.    Dispense:  21 tablet    Refill:  0   albuterol (VENTOLIN HFA) 108 (90 Base) MCG/ACT inhaler    Sig: Inhale 2 puffs into the lungs every 8 (eight) hours as needed for wheezing or shortness of breath.    Dispense:  8 g    Refill:  2   atorvastatin (LIPITOR) 40 MG tablet    Sig: Take 1 tablet (40 mg total) by mouth daily.    Dispense:  90 tablet    Refill:  0   Continuous Blood Gluc Sensor (FREESTYLE LIBRE 2 SENSOR) MISC    Sig: 1 application by Does not apply  route every 14 (fourteen) days.    Dispense:  2 each    Refill:  0    Please dispense 2 sensors   cyanocobalamin (,VITAMIN B-12,) 1000 MCG/ML injection     Sig: Inject 1 mL (1,000 mcg total) into the muscle every 30 (thirty) days. Inject 1 ml (1000 mcg ) IM weekly x 4,  Then monthly thereafter    Dispense:  3 mL    Refill:  1   diphenoxylate-atropine (LOMOTIL) 2.5-0.025 MG tablet    Sig: Take 1 tablet by mouth 4 (four) times daily as needed for diarrhea or loose stools.    Dispense:  60 tablet    Refill:  5   insulin glargine (LANTUS SOLOSTAR) 100 UNIT/ML Solostar Pen    Sig: Inject 15 Units into the skin daily.    Dispense:  15 mL    Refill:  3   insulin aspart (NOVOLOG FLEXPEN) 100 UNIT/ML FlexPen    Sig: Inject 8-16 units up to 3 times daily before meals    Dispense:  45 mL    Refill:  3   Insulin Pen Needle (DROPLET PEN NEEDLES) 31G X 5 MM MISC    Sig: USE ONE NEEDLE SUBCUTANEOUSLY AS DIRECTED (REMOVE AND DISCARD NEEDLE IN SHARPS CONTAINER IMMEDIATELY AFTER USE)    Dispense:  100 each    Refill:  1   ondansetron (ZOFRAN ODT) 4 MG disintegrating tablet    Sig: Take 1 tablet (4 mg total) by mouth every 8 (eight) hours as needed for nausea or vomiting.    Dispense:  20 tablet    Refill:  0   Semaglutide,0.25 or 0.5MG /DOS, (OZEMPIC, 0.25 OR 0.5 MG/DOSE,) 2 MG/1.5ML SOPN    Sig: Inject 0.5 mg into the skin once a week.    Dispense:  1.5 mL    Refill:  2   umeclidinium-vilanterol (ANORO ELLIPTA) 62.5-25 MCG/INH AEPB    Sig: Inhale 1 puff into the lungs daily.    Dispense:  60 each    Refill:  3    Order Specific Question:   Lot Number?    Answer:   [id number]    Order Specific Question:   Manufacturer?    Answer:   [employer] [[id number]]    Order Specific Question:   Quantity    Answer:   1    Comments:   sample    Medications Discontinued During This Encounter  Medication Reason   albuterol (VENTOLIN HFA) 108 (90 Base) MCG/ACT inhaler Reorder   umeclidinium-vilanterol (ANORO ELLIPTA) 62.5-25 MCG/INH AEPB Reorder   Insulin Pen Needle (DROPLET PEN NEEDLES) 31G X 5 MM MISC Reorder   diphenoxylate-atropine (LOMOTIL) 2.5-0.025 MG tablet  Reorder   insulin glargine (LANTUS SOLOSTAR) 100 UNIT/ML Solostar Pen Reorder   atorvastatin (LIPITOR) 40 MG tablet Reorder   cyanocobalamin (,VITAMIN B-12,) 1000 MCG/ML injection Reorder   Semaglutide,0.25 or 0.5MG /DOS, (OZEMPIC, 0.25 OR 0.5 MG/DOSE,) 2 MG/1.5ML SOPN Reorder   insulin aspart (NOVOLOG FLEXPEN) 100 UNIT/ML FlexPen Reorder   Continuous Blood Gluc Sensor (FREESTYLE LIBRE 2 SENSOR) MISC Reorder   ondansetron (ZOFRAN ODT) 4 MG disintegrating tablet Reorder    Follow-up: No follow-ups on file.   [name], MD

## 2021-02-18 LAB — URINE CULTURE
MICRO NUMBER:: 12207500
SPECIMEN QUALITY:: ADEQUATE

## 2021-02-20 DIAGNOSIS — E1122 Type 2 diabetes mellitus with diabetic chronic kidney disease: Secondary | ICD-10-CM | POA: Diagnosis not present

## 2021-02-20 DIAGNOSIS — I110 Hypertensive heart disease with heart failure: Secondary | ICD-10-CM | POA: Diagnosis not present

## 2021-02-20 DIAGNOSIS — E1142 Type 2 diabetes mellitus with diabetic polyneuropathy: Secondary | ICD-10-CM | POA: Diagnosis not present

## 2021-02-20 DIAGNOSIS — N183 Chronic kidney disease, stage 3 unspecified: Secondary | ICD-10-CM | POA: Diagnosis not present

## 2021-02-20 DIAGNOSIS — I5030 Unspecified diastolic (congestive) heart failure: Secondary | ICD-10-CM | POA: Diagnosis not present

## 2021-02-20 DIAGNOSIS — E11610 Type 2 diabetes mellitus with diabetic neuropathic arthropathy: Secondary | ICD-10-CM | POA: Diagnosis not present

## 2021-02-20 NOTE — Assessment & Plan Note (Signed)
She has no fractures from recent fall, which resulted in an admission with CT scans from cervical spine to lumbar spine as well as abd and pelvis  Resume tramadol for pain management as her pain may be due to withdrawal

## 2021-02-20 NOTE — Assessment & Plan Note (Deleted)
She has no fractures from recent fall, which resulted in an admission with CT scans from cervical spine to lumbar spine as well as abd and pelvis  Resume tramadol for pain management as her pain may be due to withdrawal

## 2021-02-21 DIAGNOSIS — E1122 Type 2 diabetes mellitus with diabetic chronic kidney disease: Secondary | ICD-10-CM | POA: Diagnosis not present

## 2021-02-21 DIAGNOSIS — N183 Chronic kidney disease, stage 3 unspecified: Secondary | ICD-10-CM | POA: Diagnosis not present

## 2021-02-21 DIAGNOSIS — E11610 Type 2 diabetes mellitus with diabetic neuropathic arthropathy: Secondary | ICD-10-CM | POA: Diagnosis not present

## 2021-02-21 DIAGNOSIS — E1142 Type 2 diabetes mellitus with diabetic polyneuropathy: Secondary | ICD-10-CM | POA: Diagnosis not present

## 2021-02-21 DIAGNOSIS — I5030 Unspecified diastolic (congestive) heart failure: Secondary | ICD-10-CM | POA: Diagnosis not present

## 2021-02-21 DIAGNOSIS — I110 Hypertensive heart disease with heart failure: Secondary | ICD-10-CM | POA: Diagnosis not present

## 2021-02-21 NOTE — Progress Notes (Signed)
   02/22/21  CC:  Chief Complaint  Patient presents with   Cysto     HPI: Denise Sharp is a 75 y.o.female who presents today for a cystoscopy.   Last seen on 02/13/2021 by Dr Nicki Reaper MacDiarmid who states because of her obesity failed multiple medications and was not a good candidate for refractory therapies.   She underwent an InterStim in 2015 and experienced less frequency. Dr MacDiarmind believes she may be holding it too long and has a bit of urge incontinence.   Notably seen 5 days ago by PCP for dysuria.   Per urinalysis today 11-30 WBC , 3-10 RBC. Urine culture grew mixed flora.      Vitals:   02/22/21 1138  BP: (!) 162/69  Pulse: 93   NED. A&Ox3.   No respiratory distress   Abd soft, NT, ND Irritated/ excoriated labia with vaginal stenosis; normal urethra  Cystoscopy Procedure Note  Patient identification was confirmed, informed consent was obtained, and patient was prepped using Betadine solution.  Lidocaine jelly was administered per urethral meatus.    Procedure: - Flexible cystoscope introduced, inflamed vulva.  - Thorough search of the bladder revealed:    normal urethral meatus    no stones    no ulcers     no tumors    no urethral polyps    no trabeculation    Diffusely erythematous mucus with inflammation at the trigone and debris throughout      - Ureteral orifices were normal in position and appearance.  Post-Procedure: - Patient tolerated the procedure well  Assessment/ Plan:  Acute cystitis  - will treat her with presumed UTI  - Will send urine culture today as it was from a catheter  - Prescribed antibiotic Bactrim due to vaginal inflammation and debris   2. Vulvar inflammation  - May contribute to burning during urination and will refer to OB-GYN   2. Urinary urgency incontinence  - Follow-up with Dr Matilde Sprang to discuss InterStim issues  - InterStim interrogation requested by patient - recommend repeat cystoscopy to make  sure erythema has resolved with Dr. Matilde Sprang.   Follow-up with Dr Matilde Sprang or PA for InterStim and scope. Referal to OB-GYN for vulvar irritation.   I,Kailey Littlejohn,acting as a Education administrator for Hollice Espy, MD.,have documented all relevant documentation on the behalf of Hollice Espy, MD,as directed by  Hollice Espy, MD while in the presence of Hollice Espy, MD.  I have reviewed the above documentation for accuracy and completeness, and I agree with the above.   Hollice Espy, MD

## 2021-02-22 ENCOUNTER — Other Ambulatory Visit: Payer: Self-pay

## 2021-02-22 ENCOUNTER — Ambulatory Visit (INDEPENDENT_AMBULATORY_CARE_PROVIDER_SITE_OTHER): Payer: Medicare Other | Admitting: Urology

## 2021-02-22 VITALS — BP 162/69 | HR 93

## 2021-02-22 DIAGNOSIS — R319 Hematuria, unspecified: Secondary | ICD-10-CM

## 2021-02-22 DIAGNOSIS — N9089 Other specified noninflammatory disorders of vulva and perineum: Secondary | ICD-10-CM

## 2021-02-22 LAB — URINALYSIS, COMPLETE
Bilirubin, UA: NEGATIVE
Ketones, UA: NEGATIVE
Leukocytes,UA: NEGATIVE
Nitrite, UA: NEGATIVE
Protein,UA: NEGATIVE
Specific Gravity, UA: 1.02 (ref 1.005–1.030)
Urobilinogen, Ur: 1 mg/dL (ref 0.2–1.0)
pH, UA: 5 (ref 5.0–7.5)

## 2021-02-22 LAB — MICROSCOPIC EXAMINATION

## 2021-02-22 MED ORDER — SULFAMETHOXAZOLE-TRIMETHOPRIM 800-160 MG PO TABS: 1.0000 | ORAL_TABLET | Freq: Two times a day (BID) | ORAL | 0 refills | Status: DC

## 2021-02-23 ENCOUNTER — Other Ambulatory Visit: Payer: Self-pay | Admitting: Internal Medicine

## 2021-02-23 DIAGNOSIS — E11610 Type 2 diabetes mellitus with diabetic neuropathic arthropathy: Secondary | ICD-10-CM | POA: Diagnosis not present

## 2021-02-23 DIAGNOSIS — I5030 Unspecified diastolic (congestive) heart failure: Secondary | ICD-10-CM | POA: Diagnosis not present

## 2021-02-23 DIAGNOSIS — I110 Hypertensive heart disease with heart failure: Secondary | ICD-10-CM | POA: Diagnosis not present

## 2021-02-23 DIAGNOSIS — E1122 Type 2 diabetes mellitus with diabetic chronic kidney disease: Secondary | ICD-10-CM | POA: Diagnosis not present

## 2021-02-23 DIAGNOSIS — N183 Chronic kidney disease, stage 3 unspecified: Secondary | ICD-10-CM | POA: Diagnosis not present

## 2021-02-23 DIAGNOSIS — E1142 Type 2 diabetes mellitus with diabetic polyneuropathy: Secondary | ICD-10-CM | POA: Diagnosis not present

## 2021-02-24 ENCOUNTER — Telehealth: Payer: Self-pay

## 2021-02-24 NOTE — Telephone Encounter (Signed)
Pt is scheduled om 9/12 with [facility] at 1:30.

## 2021-02-24 NOTE — Telephone Encounter (Signed)
[facility] referring for Vulvar irritation. Called and left voicemail for patient to call back to be scheduled.

## 2021-02-27 DIAGNOSIS — I2510 Atherosclerotic heart disease of native coronary artery without angina pectoris: Secondary | ICD-10-CM | POA: Diagnosis not present

## 2021-02-27 DIAGNOSIS — Z9181 History of falling: Secondary | ICD-10-CM

## 2021-02-27 DIAGNOSIS — W182XXD Fall in (into) shower or empty bathtub, subsequent encounter: Secondary | ICD-10-CM | POA: Diagnosis not present

## 2021-02-27 DIAGNOSIS — K589 Irritable bowel syndrome without diarrhea: Secondary | ICD-10-CM

## 2021-02-27 DIAGNOSIS — Z6841 Body Mass Index (BMI) 40.0 and over, adult: Secondary | ICD-10-CM

## 2021-02-27 DIAGNOSIS — M545 Low back pain, unspecified: Secondary | ICD-10-CM | POA: Diagnosis not present

## 2021-02-27 DIAGNOSIS — R296 Repeated falls: Secondary | ICD-10-CM

## 2021-02-27 DIAGNOSIS — G2581 Restless legs syndrome: Secondary | ICD-10-CM

## 2021-02-27 DIAGNOSIS — R42 Dizziness and giddiness: Secondary | ICD-10-CM

## 2021-02-27 DIAGNOSIS — M4802 Spinal stenosis, cervical region: Secondary | ICD-10-CM | POA: Diagnosis not present

## 2021-02-27 DIAGNOSIS — F32A Depression, unspecified: Secondary | ICD-10-CM

## 2021-02-27 DIAGNOSIS — I110 Hypertensive heart disease with heart failure: Secondary | ICD-10-CM | POA: Diagnosis not present

## 2021-02-27 DIAGNOSIS — E1142 Type 2 diabetes mellitus with diabetic polyneuropathy: Secondary | ICD-10-CM | POA: Diagnosis not present

## 2021-02-27 DIAGNOSIS — M1990 Unspecified osteoarthritis, unspecified site: Secondary | ICD-10-CM

## 2021-02-27 DIAGNOSIS — E1122 Type 2 diabetes mellitus with diabetic chronic kidney disease: Secondary | ICD-10-CM | POA: Diagnosis not present

## 2021-02-27 DIAGNOSIS — E11610 Type 2 diabetes mellitus with diabetic neuropathic arthropathy: Secondary | ICD-10-CM | POA: Diagnosis not present

## 2021-02-27 DIAGNOSIS — E1342 Other specified diabetes mellitus with diabetic polyneuropathy: Secondary | ICD-10-CM

## 2021-02-27 DIAGNOSIS — N183 Chronic kidney disease, stage 3 unspecified: Secondary | ICD-10-CM | POA: Diagnosis not present

## 2021-02-27 DIAGNOSIS — G8929 Other chronic pain: Secondary | ICD-10-CM | POA: Diagnosis not present

## 2021-02-27 DIAGNOSIS — E785 Hyperlipidemia, unspecified: Secondary | ICD-10-CM | POA: Diagnosis not present

## 2021-02-27 DIAGNOSIS — G4733 Obstructive sleep apnea (adult) (pediatric): Secondary | ICD-10-CM

## 2021-02-27 DIAGNOSIS — F411 Generalized anxiety disorder: Secondary | ICD-10-CM

## 2021-02-27 DIAGNOSIS — Z794 Long term (current) use of insulin: Secondary | ICD-10-CM

## 2021-02-27 DIAGNOSIS — I5030 Unspecified diastolic (congestive) heart failure: Secondary | ICD-10-CM | POA: Diagnosis not present

## 2021-02-27 LAB — CULTURE, URINE COMPREHENSIVE

## 2021-02-28 ENCOUNTER — Telehealth: Payer: Self-pay | Admitting: *Deleted

## 2021-02-28 ENCOUNTER — Encounter: Payer: Self-pay | Admitting: Physician Assistant

## 2021-02-28 ENCOUNTER — Other Ambulatory Visit: Payer: Self-pay

## 2021-02-28 ENCOUNTER — Ambulatory Visit (INDEPENDENT_AMBULATORY_CARE_PROVIDER_SITE_OTHER): Payer: Medicare Other | Admitting: Physician Assistant

## 2021-02-28 VITALS — BP 105/70 | HR 80 | Ht 64.5 in | Wt 236.0 lb

## 2021-02-28 DIAGNOSIS — I5030 Unspecified diastolic (congestive) heart failure: Secondary | ICD-10-CM | POA: Diagnosis not present

## 2021-02-28 DIAGNOSIS — E1122 Type 2 diabetes mellitus with diabetic chronic kidney disease: Secondary | ICD-10-CM | POA: Diagnosis not present

## 2021-02-28 DIAGNOSIS — N183 Chronic kidney disease, stage 3 unspecified: Secondary | ICD-10-CM | POA: Diagnosis not present

## 2021-02-28 DIAGNOSIS — I110 Hypertensive heart disease with heart failure: Secondary | ICD-10-CM | POA: Diagnosis not present

## 2021-02-28 DIAGNOSIS — E1142 Type 2 diabetes mellitus with diabetic polyneuropathy: Secondary | ICD-10-CM | POA: Diagnosis not present

## 2021-02-28 DIAGNOSIS — N3941 Urge incontinence: Secondary | ICD-10-CM

## 2021-02-28 DIAGNOSIS — E11610 Type 2 diabetes mellitus with diabetic neuropathic arthropathy: Secondary | ICD-10-CM | POA: Diagnosis not present

## 2021-02-28 MED ORDER — FOSFOMYCIN TROMETHAMINE 3 G PO PACK: 3.0000 g | PACK | Freq: Once | ORAL | 0 refills | Status: AC

## 2021-02-28 MED ORDER — FOSFOMYCIN TROMETHAMINE 3 G PO PACK: 3.0000 g | PACK | Freq: Once | ORAL | 0 refills | Status: DC

## 2021-02-28 NOTE — Telephone Encounter (Addendum)
Left VM with details, asked to return call. Sent in fosfomycin to [facility]  ----- Message from Hollice Espy, MD sent at 02/28/2021  8:36 AM EDT ----- She ended up growing a multidrug-resistant bacteria that is resistant to oral medications.  We can try fosfomycin 3 g x 1 to see if that works otherwise we will need to send her over to infectious disease.  Hollice Espy, MD

## 2021-02-28 NOTE — Telephone Encounter (Signed)
Patient notified

## 2021-02-28 NOTE — Patient Instructions (Signed)
Tracking Your Bladder Symptoms    Patient Name:___________________________________________________   Sample: Day   Daytime Voids  Nighttime Voids Urgency for the Day(0-4) Number of Accidents Beverage Comments  Monday IIII II 2 I Water IIII Coffee  I      Week Starting:____________________________________   Day Daytime  Voids Nighttime  Voids Urgency for  The Day(0-4) Number of Accidents Beverages Comments                                                           This week my symptoms were:  O much better  O better O the same O worse   

## 2021-02-28 NOTE — Progress Notes (Signed)
Interstim Management   Patient is present today for interstim check. Patient complains of symptoms incontinence, symptoms started 3 weeks after a fall on 02/06/21 seen in ED. Patient is currently being treated for a UTI will complete abx tomorrow. It was explained that would not typically make changes due to UTI possibly causing symptom change but due to the recent fall will perform an Impedance check and Program setting sensation response. Patient would also like to know current batter life:  Impedance check was done and there is no Impedance noted -Battery life was noted to be 13-25 months -Patient is currently on Program # 3 setting 1.7, increased to 1.85 when patient began feeling sensation in right buttocks, she states she typically fells sensation in the vaginal area with this program -Program was changed to Program #2 setting 1.5, increased to 1.55 patient is feeling sensation in vaginal area more to the right side -Program was changed to Program #1 setting 1.2 , patient is feeling sensation in the mid vaginal area   Patient was left on Program #1. Patient should keep a voiding dairy for 1 week and return for nurse visit to reassess and see if symptoms improve or if more adjustments need to be made.  Detailed instruction/review of interstim remote control and program adjustment was given to patient at today's visit.

## 2021-03-01 ENCOUNTER — Ambulatory Visit: Payer: Medicare Other | Admitting: Internal Medicine

## 2021-03-01 DIAGNOSIS — N183 Chronic kidney disease, stage 3 unspecified: Secondary | ICD-10-CM | POA: Diagnosis not present

## 2021-03-01 DIAGNOSIS — E1122 Type 2 diabetes mellitus with diabetic chronic kidney disease: Secondary | ICD-10-CM | POA: Diagnosis not present

## 2021-03-01 DIAGNOSIS — I5030 Unspecified diastolic (congestive) heart failure: Secondary | ICD-10-CM | POA: Diagnosis not present

## 2021-03-01 DIAGNOSIS — E1142 Type 2 diabetes mellitus with diabetic polyneuropathy: Secondary | ICD-10-CM | POA: Diagnosis not present

## 2021-03-01 DIAGNOSIS — E11610 Type 2 diabetes mellitus with diabetic neuropathic arthropathy: Secondary | ICD-10-CM | POA: Diagnosis not present

## 2021-03-01 DIAGNOSIS — I110 Hypertensive heart disease with heart failure: Secondary | ICD-10-CM | POA: Diagnosis not present

## 2021-03-06 ENCOUNTER — Other Ambulatory Visit: Payer: Self-pay

## 2021-03-06 ENCOUNTER — Encounter: Payer: Self-pay | Admitting: Internal Medicine

## 2021-03-06 ENCOUNTER — Ambulatory Visit (INDEPENDENT_AMBULATORY_CARE_PROVIDER_SITE_OTHER): Payer: Medicare Other

## 2021-03-06 DIAGNOSIS — I110 Hypertensive heart disease with heart failure: Secondary | ICD-10-CM | POA: Diagnosis not present

## 2021-03-06 DIAGNOSIS — E11610 Type 2 diabetes mellitus with diabetic neuropathic arthropathy: Secondary | ICD-10-CM | POA: Diagnosis not present

## 2021-03-06 DIAGNOSIS — N183 Chronic kidney disease, stage 3 unspecified: Secondary | ICD-10-CM | POA: Diagnosis not present

## 2021-03-06 DIAGNOSIS — E1142 Type 2 diabetes mellitus with diabetic polyneuropathy: Secondary | ICD-10-CM | POA: Diagnosis not present

## 2021-03-06 DIAGNOSIS — I5030 Unspecified diastolic (congestive) heart failure: Secondary | ICD-10-CM | POA: Diagnosis not present

## 2021-03-06 DIAGNOSIS — E1122 Type 2 diabetes mellitus with diabetic chronic kidney disease: Secondary | ICD-10-CM | POA: Diagnosis not present

## 2021-03-06 DIAGNOSIS — N3941 Urge incontinence: Secondary | ICD-10-CM

## 2021-03-06 NOTE — Progress Notes (Signed)
Patient present today to follow up on Interstim management. After program adjustment last week patient states she is doing well. Symptoms are much better. Voiding diary shows 2-4 day time voids 0-1 night time voids mild urgency and 0-1 accident. Patient states she is not feeling well today, GU upset and trouble with elevated blood sugar levels. She was encouraged to follow up with PCP and monitor Blood sugar levels. She will keep her program as is and keep follow up with Dr. Matilde Sprang as planned for repeat cysto. At that visit will discuss scheduling 1 year follow up for interstim replacement due to battery life. Patient is in agreement with this plan.

## 2021-03-07 ENCOUNTER — Telehealth: Payer: Self-pay | Admitting: Internal Medicine

## 2021-03-07 DIAGNOSIS — N183 Chronic kidney disease, stage 3 unspecified: Secondary | ICD-10-CM | POA: Diagnosis not present

## 2021-03-07 DIAGNOSIS — I110 Hypertensive heart disease with heart failure: Secondary | ICD-10-CM | POA: Diagnosis not present

## 2021-03-07 DIAGNOSIS — E1142 Type 2 diabetes mellitus with diabetic polyneuropathy: Secondary | ICD-10-CM | POA: Diagnosis not present

## 2021-03-07 DIAGNOSIS — E11610 Type 2 diabetes mellitus with diabetic neuropathic arthropathy: Secondary | ICD-10-CM | POA: Diagnosis not present

## 2021-03-07 DIAGNOSIS — I5030 Unspecified diastolic (congestive) heart failure: Secondary | ICD-10-CM | POA: Diagnosis not present

## 2021-03-07 DIAGNOSIS — E1122 Type 2 diabetes mellitus with diabetic chronic kidney disease: Secondary | ICD-10-CM | POA: Diagnosis not present

## 2021-03-07 NOTE — Telephone Encounter (Signed)
No available appts in our office this week. Pt was advised to go to [facility] and stated they she will go to the one across the street tomorrow morning when they open.

## 2021-03-07 NOTE — Telephone Encounter (Signed)
Malachy Mood from [facility], [phone], called to advise that the PT has a rash on her right side of her face that she has concerns about it possibly being shingles. She states that it is blistering up and that the PT is stating it is painful and spreading down her face. They wanted to be seen this week but nothing available with Dr.Tullo. Please advise.

## 2021-03-08 ENCOUNTER — Encounter: Payer: Self-pay | Admitting: Emergency Medicine

## 2021-03-08 ENCOUNTER — Other Ambulatory Visit: Payer: Self-pay

## 2021-03-08 ENCOUNTER — Ambulatory Visit
Admission: EM | Admit: 2021-03-08 | Discharge: 2021-03-08 | Disposition: A | Discharge status: Home / Self Care | Payer: Medicare Other | Attending: Emergency Medicine | Admitting: Emergency Medicine

## 2021-03-08 DIAGNOSIS — N183 Chronic kidney disease, stage 3 unspecified: Secondary | ICD-10-CM | POA: Diagnosis not present

## 2021-03-08 DIAGNOSIS — I5030 Unspecified diastolic (congestive) heart failure: Secondary | ICD-10-CM | POA: Diagnosis not present

## 2021-03-08 DIAGNOSIS — B029 Zoster without complications: Secondary | ICD-10-CM | POA: Diagnosis not present

## 2021-03-08 DIAGNOSIS — I110 Hypertensive heart disease with heart failure: Secondary | ICD-10-CM | POA: Diagnosis not present

## 2021-03-08 DIAGNOSIS — E1142 Type 2 diabetes mellitus with diabetic polyneuropathy: Secondary | ICD-10-CM | POA: Diagnosis not present

## 2021-03-08 DIAGNOSIS — E1122 Type 2 diabetes mellitus with diabetic chronic kidney disease: Secondary | ICD-10-CM | POA: Diagnosis not present

## 2021-03-08 DIAGNOSIS — E11610 Type 2 diabetes mellitus with diabetic neuropathic arthropathy: Secondary | ICD-10-CM | POA: Diagnosis not present

## 2021-03-08 MED ORDER — VALACYCLOVIR HCL 1 G PO TABS: 1000.0000 mg | ORAL_TABLET | Freq: Two times a day (BID) | ORAL | 0 refills | Status: AC

## 2021-03-08 NOTE — Discharge Instructions (Addendum)
Schedule an appointment with your eye specialist within the next 2-3 days for examination of left eye  Apply cool compresses to areas of concern  Take valacyclovir as prescribed  Return for any worsening of symptoms to include: fever, chills, vomiting, visual changes, or eye pain

## 2021-03-08 NOTE — ED Triage Notes (Signed)
Pt here with rash that has started to scab over on right and middle forehead and down nose. Pain behind right eye, but no vision changes x 2 weeks.

## 2021-03-08 NOTE — ED Provider Notes (Signed)
Chief Complaint   Chief Complaint  Patient presents with   Rash     Subjective, HPI  Denise Sharp is a very pleasant 75 y.o. female who presents with painful facial rash over right forehead and onto bridge of nose for which she noticed about 2 weeks ago.  Patient reports some discomfort behind the right eye, but no visual changes.  History obtained from patient.   Patient's problem list, past medical and social history, medications, and allergies were reviewed by me and updated in Epic.    ROS  See HPI.  Objective   Vitals:   03/08/21 0926  BP: 114/75  Pulse: 84  Resp: 18  Temp: 98.8 F (37.1 C)  SpO2: 95%    Vital signs and nursing note reviewed.  General: Appears well-developed and well-nourished. No acute distress.  HEENT: Normocephalic, atraumatic, hearing grossly intact. EOMI, PERRLA, no drainage. No rhinorrhea. Moist mucous membranes.  Neck: Normal range of motion, neck is supple.  Cardiovascular: Normal rate.  Pulm/Chest: No respiratory distress.   Musculoskeletal: No joint deformity, normal range of motion.  Skin: Erythematous excoriated rash noted to right side of forehead extended into hairline, right side of nasal bridge.  Data  No results found for any visits on 03/08/21.   Assessment & Plan  1. Herpes zoster without complication - valACYclovir (VALTREX) 1000 MG tablet; Take 1 tablet (1,000 mg total) by mouth 2 (two) times daily for 7 days.  Dispense: 14 tablet; Refill: 0  75 y.o. female presents with painful facial rash over right forehead and onto bridge of nose for which she noticed about 2 weeks ago.  Patient reports some discomfort behind the right eye, but no visual changes.  Chart review completed.  Given assessment findings, likely facial rash consistent with shingles.  Patient's last GFR was 41, dosed valacyclovir 1 g twice daily for the next 7 days.  Spoke with the patient about following up with her ophthalmologist within the next 2 to 3 days  as she will need an eye exam with a slit-lamp.  Patient is not experiencing any visual changes today and no significant severe pain to the right eye.  She does report some mild discomfort behind the right eye.  PERRLA, EOMI.  Patient's rash does not extend over the eyelid on the right side or to the inferior orbit of the right eye.  Patient states that she will follow-up with her ophthalmologist today to see if they can schedule her for an appointment to be seen this afternoon.  Advised of strict return precautions for worsening of symptoms to include acute visual changes, worsening eye pain, fever or chills.  Advised to use cool compresses to the areas of rash to help with discomfort.  Patient verbalized understanding and agreed with plan.  Patient stable upon discharge.  Return as needed.  Plan:   Discharge Instructions      Schedule an appointment with your eye specialist within the next 2-3 days for examination of left eye  Apply cool compresses to areas of concern  Take valacyclovir as prescribed  Return for any worsening of symptoms to include: fever, chills, vomiting, visual changes, or eye pain         Serafina Royals, FNP 03/08/21 1025

## 2021-03-09 ENCOUNTER — Other Ambulatory Visit: Payer: Self-pay | Admitting: Internal Medicine

## 2021-03-09 ENCOUNTER — Ambulatory Visit (INDEPENDENT_AMBULATORY_CARE_PROVIDER_SITE_OTHER): Payer: Medicare Other | Admitting: Neurology

## 2021-03-09 ENCOUNTER — Encounter: Payer: Self-pay | Admitting: Neurology

## 2021-03-09 VITALS — BP 118/70 | HR 89 | Ht 64.0 in | Wt 241.0 lb

## 2021-03-09 DIAGNOSIS — I25119 Atherosclerotic heart disease of native coronary artery with unspecified angina pectoris: Secondary | ICD-10-CM

## 2021-03-09 DIAGNOSIS — E1142 Type 2 diabetes mellitus with diabetic polyneuropathy: Secondary | ICD-10-CM | POA: Diagnosis not present

## 2021-03-09 DIAGNOSIS — R269 Unspecified abnormalities of gait and mobility: Secondary | ICD-10-CM | POA: Diagnosis not present

## 2021-03-09 DIAGNOSIS — B0229 Other postherpetic nervous system involvement: Secondary | ICD-10-CM | POA: Diagnosis not present

## 2021-03-09 HISTORY — DX: Other postherpetic nervous system involvement: B02.29

## 2021-03-09 MED ORDER — PREGABALIN 50 MG PO CAPS: 50.0000 mg | ORAL_CAPSULE | Freq: Two times a day (BID) | ORAL | 3 refills | Status: DC

## 2021-03-09 NOTE — Telephone Encounter (Signed)
Historical medication  Last OV: 02/17/2021 Next OV: not scheduled

## 2021-03-09 NOTE — Progress Notes (Signed)
Reason for visit: Peripheral neuropathy, gait disturbance, recent right V1 shingles outbreak, postherpetic neuralgia  Denise Sharp is an 75 y.o. female  History of present illness:  Denise Sharp is a 75 year old right-handed white female with a history of diabetes, obesity, and a significant diabetic peripheral neuropathy associated with a gait disorder.  She has had issues with episodic falls.  The patient is now using a walker religiously when she ambulates.  The patient has some assistance in the home environment.  She still is operating motor vehicle and she believes that she can do this safely, but her husband usually is the one who drives.  She has noted onset of a right V1 distribution shingles outbreak, she is having a lot of discomfort with this currently.  She is on Lyrica but is only taking 50 mg daily.  She is having difficulty sleeping because of the pain.  She has not fallen recently with a walker.  She comes here for further evaluation.  Past Medical History:  Diagnosis Date   (HFpEF) heart failure with preserved ejection fraction ([place])    a. 12/2019 Echo: EF 65-70%, Gr2 DD. No significant valvular dzs.   Abnormality of gait 03/25/2013   Adrenal mass, left ([place])    Anemia    iron deficient post  2 unit txfsn 2009, normal endo/colonoscopy by [facility]   Arthritis    CAD (coronary artery disease) with h/o Atypical Chest Pain    a. 04/1986 Cath ([facility]): nl cors, EF 65%; b. 07/2012 Cath [facility]): Diff minor irregs; c. 07/2016 MV [facility]): "Equivocal"; d. 08/2016 Cardiac CT Ca2+ score [facility]): Ca2+ 0350; e. 05/2019 MV: No ischemia. EF 75%; f. 12/2019 PCI: LM nl, LAD 65ost (3.5x12 Resolute DES), 57m (2.75x12 Resolute DES),LCX/RCA nl; g. 08/2020 Cath: patent LAD stents, RCA 40ost, elev RH pressures, EF >65%.   Cervical spinal stenosis 1994   due to trauma to back ([employer] accident), has intermittent paralysis and parasthesias   Cervicogenic headache 03/23/2014   Depression    Dizziness     chronic dizziness   DJD (degenerative joint disease)    a. Chronic R shoulder pain pending R shoulder replacement 07/2017.   Esophageal stenosis March 2011   with transietn outlet obstruction by food, cleared by EGD    Family history of adverse reaction to anesthesia    daughter PONV   Gastric bypass status for obesity    Headache(784.0)    Hypertension    IBS (irritable bowel syndrome)    Left bundle branch block (LBBB)    a. Intermittently present - likely rate related. - patient denies   Obesity    Obstructive sleep apnea    using CPAP   Polyneuropathy in diabetes(357.2) 03/25/2013   Restless leg syndrome    Rotator cuff arthropathy, right 08/13/2017   Syncope and collapse 03/12/2014   Type II diabetes mellitus ([place])     Past Surgical History:  Procedure Laterality Date   ABDOMINAL HYSTERECTOMY     APPENDECTOMY     CARDIAC CATHETERIZATION     COLONOSCOPY     CORONARY STENT INTERVENTION N/A 01/04/2020   Procedure: CORONARY STENT INTERVENTION;  Surgeon: [name], MD;  Location: [place] [facility];  Service: Cardiovascular;  Laterality: N/A;  LAD    DIAPHRAGMATIC HERNIA REPAIR  2015   ESOPHAGEAL DILATION     multiple   ESOPHAGOGASTRODUODENOSCOPY     GALLBLADDER SURGERY  1976   GALLBLADDER SURGERY  resection   GASTRIC BYPASS  GASTRIC BYPASS  2000, 2005   Dr. [name] IMPLANT PLACEMENT  April 2013   [place] REPLACEMENT  2007   bilateral knee. [name],  [name]   LEFT HEART CATH AND CORS/GRAFTS ANGIOGRAPHY N/A 12/31/2019   Procedure: LEFT HEART CATH AND CORS/GRAFTS ANGIOGRAPHY;  Surgeon: [name], MD;  Location: [place] [facility];  Service: Cardiovascular;  Laterality: N/A;   PANNICULECTOMY  06/16/2019   PANNICULECTOMY N/A 06/16/2019   Procedure: PANNICULECTOMY;  Surgeon: [name], MD;  Location: [place];  Service: Plastics;  Laterality: N/A;  3 hours, please   REVERSE SHOULDER ARTHROPLASTY Right 08/13/2017   Procedure: REVERSE  RIGHT SHOULDER ARTHROPLASTY;  Surgeon: [name], MD;  Location: [place];  Service: Orthopedics;  Laterality: Right;   RIGHT/LEFT HEART CATH AND CORONARY ANGIOGRAPHY N/A 09/06/2020   Procedure: RIGHT/LEFT HEART CATH AND CORONARY ANGIOGRAPHY;  Surgeon: [name], MD;  Location: [place] [facility];  Service: Cardiovascular;  Laterality: N/A;   ROTATOR CUFF REPAIR     right   SPINE SURGERY  1995   [place]   TOTAL ABDOMINAL HYSTERECTOMY W/ BILATERAL SALPINGOOPHORECTOMY  1610   UMBILICAL HERNIA REPAIR  Aug 11, 2015    Family History  Problem Relation Age of Onset   Heart disease Father    Hypertension Father    Prostate cancer Father    Stroke Father    Osteoporosis Father    Stroke Mother    Depression Mother    Headache Mother    Heart disease Mother    Thyroid disease Mother    Hypertension Mother    Diabetes Daughter    Heart disease Daughter    Hypertension Daughter    Hypertension Son     Social history:  reports that she has never smoked. She has never used smokeless tobacco. She reports current drug use. She reports that she does not drink alcohol.    Allergies  Allergen Reactions   Demeclocycline Hives   Erythromycin Nausea And Vomiting and Other (See Comments)    Severe irritable bowel   Flagyl [Metronidazole] Nausea And Vomiting and Other (See Comments)    Severe irritable bowel   Glucophage [Metformin Hcl] Nausea And Vomiting and Other (See Comments)    "Sick" "I won't take anything that has metformin in it"   Tetracyclines & Related Hives and Rash   Diovan [Valsartan] Nausea Only        Sulfa Antibiotics Rash and Other (See Comments)    As child   Xanax [Alprazolam] Other (See Comments)    Unknown reaction    Medications:  Prior to Admission medications   Medication Sig Start Date End Date Taking? Authorizing Provider  aspirin EC 81 MG tablet Take 1 tablet (81 mg total) by mouth daily. 01/05/20  Yes Fritzi Mandes, MD  atorvastatin (LIPITOR) 40 MG  tablet Take 1 tablet (40 mg total) by mouth daily. 02/17/21  Yes [name], MD  clotrimazole-betamethasone (LOTRISONE) cream Apply 1 application topically 2 (two) times daily as needed.   Yes [provider]  Continuous Blood Gluc Receiver (FREESTYLE LIBRE 2 READER) DEVI Use to check sugar at least TID 09/26/20  Yes [name], MD  Continuous Blood Gluc Sensor (FREESTYLE LIBRE 2 SENSOR) MISC 1 application by Does not apply route every 14 (fourteen) days. 02/17/21  Yes [name], MD  cyanocobalamin (,VITAMIN B-12,) 1000 MCG/ML injection Inject 1 mL (1,000 mcg total) into the muscle every 30 (thirty) days.  Inject 1 ml (1000 mcg ) IM weekly x 4,  Then monthly thereafter 02/17/21  Yes [name], MD  dapagliflozin propanediol (FARXIGA) 10 MG TABS tablet Take 10 mg by mouth daily.   Yes [provider]  diphenoxylate-atropine (LOMOTIL) 2.5-0.025 MG tablet Take 1 tablet by mouth 4 (four) times daily as needed for diarrhea or loose stools. 02/17/21  Yes [name], MD  erythromycin ophthalmic ointment 1 application at bedtime.   Yes [provider]  fexofenadine (ALLEGRA) 180 MG tablet Take 180 mg by mouth daily.   Yes [provider]  insulin aspart (NOVOLOG FLEXPEN) 100 UNIT/ML FlexPen Inject 8-16 units up to 3 times daily before meals 02/17/21  Yes [name], MD  insulin glargine (LANTUS SOLOSTAR) 100 UNIT/ML Solostar Pen Inject 15 Units into the skin daily. 02/17/21  Yes [name], MD  Insulin Pen Needle (DROPLET PEN NEEDLES) 31G X 5 MM MISC USE ONE NEEDLE SUBCUTANEOUSLY AS DIRECTED (REMOVE AND DISCARD NEEDLE IN SHARPS CONTAINER IMMEDIATELY AFTER USE) 02/17/21  Yes [name], MD  KLOR-CON M20 20 MEQ tablet TAKE 1 TABLET BY MOUTH EVERY DAY 02/03/21  Yes End, Harrell Gave, MD  Lidocaine 4 % PTCH Apply topically daily.   Yes [provider]  metolazone (ZAROXOLYN) 2.5 MG tablet Take 2.5 mg by mouth daily. 02/15/21  Yes [provider]  nitroGLYCERIN (NITROSTAT) 0.4 MG SL tablet Place 1 tablet (0.4 mg total) under the tongue every 5 (five) minutes as needed for chest pain. 09/23/20  Yes Theora Gianotti, NP  NONFORMULARY OR COMPOUNDED [place]  Combination Pain Cream -  Baclofen 2%, Doxepin 5%, Gabapentin 6%, Topiramate 2%, Pentoxifylline 3% Apply 1-2 grams to affected area 3-4 times daily Qty. 120 gm 3 refills 10/14/17  Yes Gardiner Barefoot, DPM  nystatin (MYCOSTATIN/NYSTOP) powder Apply 1 g topically 4 (four) times daily as needed (rash).   Yes [provider]  ondansetron (ZOFRAN-ODT) 4 MG disintegrating tablet DISSOLVE ONE TABLET ON TONGUE EVERY 8 HOURS AS NEEDED FOR NAUSEA OR VOMITING 02/24/21  Yes [name], MD  Polyethyl Glycol-Propyl Glycol (SYSTANE) 0.4-0.3 % GEL ophthalmic gel Place 1 application into both eyes.   Yes [provider]  pregabalin (LYRICA) 50 MG capsule Take 50 mg by mouth daily.   Yes [provider]  Semaglutide,0.25 or 0.5MG /DOS, (OZEMPIC, 0.25 OR 0.5 MG/DOSE,) 2 MG/1.5ML SOPN Inject 0.5 mg into the skin once a week. 02/17/21  Yes [name], MD  sulfamethoxazole-trimethoprim (BACTRIM DS) 800-160 MG tablet Take 1 tablet by mouth 2 (two) times daily. 02/22/21  Yes Hollice Espy, MD  torsemide (DEMADEX) 10 MG tablet Take 1 tablet (10 mg total) by mouth daily. 02/03/21  Yes Furth, Cadence H, PA-C  valACYclovir (VALTREX) 1000 MG tablet Take 1 tablet (1,000 mg total) by mouth 2 (two) times daily for 7 days. 03/08/21 03/15/21 Yes Boddu, Erasmo Downer, FNP    ROS:  Out of a complete 14 system review of symptoms, the patient complains only of the following symptoms, and all other reviewed systems are negative.  Shingles pain, right face Walking difficulty Numbness in the feet  Blood pressure 118/70, pulse 89, height 5\' 4"  (1.626 m), weight 241 lb (109.3 kg), SpO2 95 %.  Physical Exam  General: The patient is alert and cooperative at the  time of the examination.  The patient is markedly obese.  Skin: No significant peripheral edema is noted.   Neurologic Exam  Mental status: The patient  is alert and oriented x 3 at the time of the examination. The patient has apparent normal recent and remote memory, with an apparently normal attention span and concentration ability.   Cranial nerves: Facial symmetry is present. Speech is normal, no aphasia or dysarthria is noted. Extraocular movements are full. Visual fields are full.  Motor: The patient has good strength in all 4 extremities.  Sensory examination: Soft touch sensation is symmetric on the face, arms, and legs.  Coordination: The patient has good finger-nose-finger and heel-to-shin bilaterally.  Gait and station: The patient has the ability to ambulate with a walker, she has fairly good stride and turns with a walker.  Romberg is negative, no drift is seen.  Tandem gait was not attempted.  Reflexes: Deep tendon reflexes are symmetric.  The reflexes are relatively normal in arms, decreased in the legs.   CT cervical 09/23/20:   IMPRESSION: 1. 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 This could be associated with calcific tendinitis of the longus colli. However, there is no evidence of retro pharyngeal inflammation, therefore I favor this is just part of the C1-2 arthritis. None the less, it could contribute to craniocervical pain syndromes. 2. Degenerative spondylosis at C5-6 and C6-7 with foraminal encroachment that could affect either or both C6 and left C7 nerves. Findings could also contribute to cervicalgia. 3. Mild facet osteoarthritis on the left at C7-T1. 4. Mild right foraminal narrowing at C3-4. Mild left foraminal narrowing at C4-5.   Assessment/Plan:  1.  Diabetic peripheral neuropathy  2.  Gait disorder  3.  Right V1 distribution shingles outbreak, postherpetic neuralgia  The patient will be increased on the Lyrica at  least temporarily taking 50 mg twice daily for the neuralgia pain.  Once the facial pain reduces, she can cut back to 1 capsule daily.  A prescription was sent in.  She will follow-up here in 6 months, in the future she can be followed through Dr. Krista Blue.  Jill Alexanders MD 03/09/2021 11:17 AM  [facility] [address] [place], [place] [address]  Phone [phone] Fax [phone]

## 2021-03-13 DIAGNOSIS — G2581 Restless legs syndrome: Secondary | ICD-10-CM | POA: Diagnosis not present

## 2021-03-13 DIAGNOSIS — E1142 Type 2 diabetes mellitus with diabetic polyneuropathy: Secondary | ICD-10-CM | POA: Diagnosis not present

## 2021-03-13 DIAGNOSIS — M4802 Spinal stenosis, cervical region: Secondary | ICD-10-CM | POA: Diagnosis not present

## 2021-03-13 DIAGNOSIS — N183 Chronic kidney disease, stage 3 unspecified: Secondary | ICD-10-CM | POA: Diagnosis not present

## 2021-03-13 DIAGNOSIS — Z6841 Body Mass Index (BMI) 40.0 and over, adult: Secondary | ICD-10-CM | POA: Diagnosis not present

## 2021-03-13 DIAGNOSIS — E785 Hyperlipidemia, unspecified: Secondary | ICD-10-CM | POA: Diagnosis not present

## 2021-03-13 DIAGNOSIS — I5030 Unspecified diastolic (congestive) heart failure: Secondary | ICD-10-CM | POA: Diagnosis not present

## 2021-03-13 DIAGNOSIS — G8929 Other chronic pain: Secondary | ICD-10-CM | POA: Diagnosis not present

## 2021-03-13 DIAGNOSIS — I110 Hypertensive heart disease with heart failure: Secondary | ICD-10-CM | POA: Diagnosis not present

## 2021-03-13 DIAGNOSIS — E1342 Other specified diabetes mellitus with diabetic polyneuropathy: Secondary | ICD-10-CM | POA: Diagnosis not present

## 2021-03-13 DIAGNOSIS — I2510 Atherosclerotic heart disease of native coronary artery without angina pectoris: Secondary | ICD-10-CM | POA: Diagnosis not present

## 2021-03-13 DIAGNOSIS — M545 Low back pain, unspecified: Secondary | ICD-10-CM | POA: Diagnosis not present

## 2021-03-13 DIAGNOSIS — F32A Depression, unspecified: Secondary | ICD-10-CM | POA: Diagnosis not present

## 2021-03-13 DIAGNOSIS — E1122 Type 2 diabetes mellitus with diabetic chronic kidney disease: Secondary | ICD-10-CM | POA: Diagnosis not present

## 2021-03-13 DIAGNOSIS — R296 Repeated falls: Secondary | ICD-10-CM | POA: Diagnosis not present

## 2021-03-13 DIAGNOSIS — W182XXD Fall in (into) shower or empty bathtub, subsequent encounter: Secondary | ICD-10-CM | POA: Diagnosis not present

## 2021-03-13 DIAGNOSIS — E11610 Type 2 diabetes mellitus with diabetic neuropathic arthropathy: Secondary | ICD-10-CM | POA: Diagnosis not present

## 2021-03-13 DIAGNOSIS — M1990 Unspecified osteoarthritis, unspecified site: Secondary | ICD-10-CM | POA: Diagnosis not present

## 2021-03-13 DIAGNOSIS — R42 Dizziness and giddiness: Secondary | ICD-10-CM | POA: Diagnosis not present

## 2021-03-13 DIAGNOSIS — K589 Irritable bowel syndrome without diarrhea: Secondary | ICD-10-CM | POA: Diagnosis not present

## 2021-03-13 DIAGNOSIS — G4733 Obstructive sleep apnea (adult) (pediatric): Secondary | ICD-10-CM | POA: Diagnosis not present

## 2021-03-13 DIAGNOSIS — F411 Generalized anxiety disorder: Secondary | ICD-10-CM | POA: Diagnosis not present

## 2021-03-13 DIAGNOSIS — Z794 Long term (current) use of insulin: Secondary | ICD-10-CM | POA: Diagnosis not present

## 2021-03-13 DIAGNOSIS — Z9181 History of falling: Secondary | ICD-10-CM | POA: Diagnosis not present

## 2021-03-14 ENCOUNTER — Ambulatory Visit (INDEPENDENT_AMBULATORY_CARE_PROVIDER_SITE_OTHER): Payer: Medicare Other | Admitting: Pharmacist

## 2021-03-14 ENCOUNTER — Other Ambulatory Visit: Payer: Self-pay

## 2021-03-14 DIAGNOSIS — E1142 Type 2 diabetes mellitus with diabetic polyneuropathy: Secondary | ICD-10-CM

## 2021-03-14 DIAGNOSIS — B029 Zoster without complications: Secondary | ICD-10-CM | POA: Diagnosis not present

## 2021-03-14 DIAGNOSIS — E1140 Type 2 diabetes mellitus with diabetic neuropathy, unspecified: Secondary | ICD-10-CM | POA: Diagnosis not present

## 2021-03-14 MED ORDER — NOVOLOG FLEXPEN 100 UNIT/ML ~~LOC~~ SOPN: PEN_INJECTOR | SUBCUTANEOUS | 3 refills | Status: DC

## 2021-03-14 MED ORDER — TRESIBA FLEXTOUCH 100 UNIT/ML ~~LOC~~ SOPN: 15.0000 [IU] | PEN_INJECTOR | Freq: Every day | SUBCUTANEOUS | 3 refills | Status: DC

## 2021-03-14 MED ORDER — OZEMPIC (0.25 OR 0.5 MG/DOSE) 2 MG/1.5ML ~~LOC~~ SOPN: 0.5000 mg | PEN_INJECTOR | SUBCUTANEOUS | 3 refills | Status: DC

## 2021-03-14 NOTE — Patient Instructions (Addendum)
  I'm going to call your insurance and see if either Toujeo or Tyler Aas (longer acting insulins) are preferred. I'll also check on the Ozempic.   Talk to the eye doctor who saw you today about whether you need another course of valacycovir.   Adjust Novolog sliding scale:  - If pre-meal glucose 100-150 - inject 10 units - If pre-meal glucose 151-200 - inject 12 units - If pre-meal glucose 201-250 - inject 14 units - If pre-meal glucose 251-300 - inject 16 units - If pre-meal glucose 301-350 - inject 18 units  - If pre-meal glucose 350 > inject 20  units   Call with any questions!  Catie Darnelle Maffucci, PharmD [phone]  Visit Information  PATIENT GOALS:  Goals Addressed               This Visit's Progress     Patient Stated     Medication Monitoring (pt-stated)        Patient Goals/Self-Care Activities Over the next 90 days, patient will:  - take medications as prescribed check blood glucose at least three times daily using CGM, document, and provide at future appointments         Print copy of patient instructions, educational materials, and care plan provided in person.   Plan: Face to Face appointment with care management team member scheduled for: ~ 8 weeks  Catie Darnelle Maffucci, PharmD, [place], [place] Clinical Pharmacist [employer] [facility] [employer] [phone]

## 2021-03-14 NOTE — Chronic Care Management (AMB) (Signed)
Chronic Care Management Pharmacy Note  03/14/2021 Name:  Denise Sharp MRN:  [id number] DOB:  10/23/1945   Subjective: Denise Sharp is an 75 y.o. year old female who is a primary patient of [name], Aris Everts, MD.  The CCM team was consulted for assistance with disease management and care coordination needs.    Engaged with patient face to face for follow up visit in response to provider referral for pharmacy case management and/or care coordination services.   Consent to Services:  The patient was given information about Chronic Care Management services, agreed to services, and gave verbal consent prior to initiation of services.  Please see initial visit note for detailed documentation.   Patient Care Team: [name], MD as PCP - General (Internal Medicine) End, Harrell Gave, MD as PCP - Cardiology (Cardiology) Chiropractic, Freddi Che as Referring Physician De Hollingshead, RPH-CPP (Pharmacist)  Objective:  Lab Results  Component Value Date   CREATININE 1.34 (H) 02/10/2021   CREATININE 1.47 (H) 02/09/2021   CREATININE 1.48 (H) 02/09/2021    Lab Results  Component Value Date   HGBA1C 8.9 (H) 02/08/2021   Last diabetic Eye exam:  Lab Results  Component Value Date/Time   HMDIABEYEEXA No Retinopathy 10/15/2016 12:00 AM    Last diabetic Foot exam:  Lab Results  Component Value Date/Time   HMDIABFOOTEX abnormal 06/23/2014 12:00 AM        Component Value Date/Time   CHOL 134 05/24/2020 1532   CHOL 155 02/27/2014 0420   TRIG 128.0 05/24/2020 1532   TRIG 121 02/27/2014 0420   HDL 60.40 05/24/2020 1532   HDL 43 02/27/2014 0420   CHOLHDL 2 05/24/2020 1532   VLDL 25.6 05/24/2020 1532   VLDL 24 02/27/2014 0420   LDLCALC 48 05/24/2020 1532   LDLCALC 88 02/27/2014 0420   LDLDIRECT 111.0 06/16/2015 0936    Hepatic Function Latest Ref Rng & Units 02/08/2021 01/27/2021 07/17/2020  Total Protein 6.5 - 8.1 g/dL 7.3 7.5 7.6  Albumin 3.5 - 5.0 g/dL 3.3(L) 3.6  3.5  AST 15 - 41 U/L 42(H) 27 18  ALT 0 - 44 U/L '20 19 12  ' Alk Phosphatase 38 - 126 U/L 102 111 129(H)  Total Bilirubin 0.3 - 1.2 mg/dL 1.9(H) 0.7 0.5  Bilirubin, Direct 0.0 - 0.2 mg/dL - <0.1 -    Lab Results  Component Value Date/Time   TSH 2.209 02/08/2021 06:55 PM   TSH 3.695 07/17/2020 11:50 AM   TSH 3.50 01/01/2019 01:45 PM   TSH 4.400 06/16/2015 09:36 AM   FREET4 1.10 05/03/2015 04:20 PM    CBC Latest Ref Rng & Units 02/09/2021 02/08/2021 01/27/2021  WBC 4.0 - 10.5 K/uL 9.2 14.2(H) 11.5(H)  Hemoglobin 12.0 - 15.0 g/dL 13.2 15.2(H) 14.8  Hematocrit 36.0 - 46.0 % 39.9 44.9 45.0  Platelets 150 - 400 K/uL 238 279 286    Lab Results  Component Value Date/Time   VD25OH 21.65 (L) 06/04/2017 02:12 PM   VD25OH 23.44 (L) 11/19/2016 02:31 PM    Clinical ASCVD: Yes  The 10-year ASCVD risk score Mikey Bussing DC Jr., et al., 2013) is: 31.2%   Values used to calculate the score:     Age: 63 years     Sex: Female     Is Non-Hispanic African American: No     Diabetic: Yes     Tobacco smoker: No     Systolic Blood Pressure: 923 mmHg     Is BP treated: Yes  HDL Cholesterol: 60.4 mg/dL     Total Cholesterol: 134 mg/dL     Social History   Tobacco Use  Smoking Status Never  Smokeless Tobacco Never   BP Readings from Last 3 Encounters:  03/09/21 118/70  03/08/21 114/75  02/28/21 105/70   Pulse Readings from Last 3 Encounters:  03/09/21 89  03/08/21 84  02/28/21 80   Wt Readings from Last 3 Encounters:  03/09/21 241 lb (109.3 kg)  02/28/21 236 lb (107 kg)  02/17/21 232 lb 12.8 oz (105.6 kg)    Assessment: Review of patient past medical history, allergies, medications, health status, including review of consultants reports, laboratory and other test data, was performed as part of comprehensive evaluation and provision of chronic care management services.   SDOH:  (Social Determinants of Health) assessments and interventions performed:  SDOH Interventions    Flowsheet  Row Most Recent Value  SDOH Interventions   Financial Strain Interventions Intervention Not Indicated       CCM Care Plan  Allergies  Allergen Reactions   Demeclocycline Hives   Erythromycin Nausea And Vomiting and Other (See Comments)    Severe irritable bowel   Flagyl [Metronidazole] Nausea And Vomiting and Other (See Comments)    Severe irritable bowel   Glucophage [Metformin Hcl] Nausea And Vomiting and Other (See Comments)    "Sick" "I won't take anything that has metformin in it"   Tetracyclines & Related Hives and Rash   Diovan [Valsartan] Nausea Only        Sulfa Antibiotics Rash and Other (See Comments)    As child   Xanax [Alprazolam] Other (See Comments)    Unknown reaction    Medications Reviewed Today     Reviewed by De Hollingshead, RPH-CPP (Pharmacist) on 03/14/21 at 1654  Med List Status: <None>   Medication Order Taking? Sig Documenting Provider Last Dose Status Informant  aspirin EC 81 MG tablet [id number]  Take 1 tablet (81 mg total) by mouth daily. Fritzi Mandes, MD  Active Self  atorvastatin (LIPITOR) 40 MG tablet [id number]  Take 1 tablet (40 mg total) by mouth daily. [name], MD  Active   clotrimazole-betamethasone Donalynn Furlong) cream [id number]  Apply 1 application topically 2 (two) times daily as needed. [provider]  Active Self  Continuous Blood Gluc Receiver (FREESTYLE LIBRE 2 READER) DEVI [id number]  Use to check sugar at least TID [name], MD  Active Self  Continuous Blood Gluc Sensor (FREESTYLE LIBRE 2 SENSOR) MISC [id number]  1 application by Does not apply route every 14 (fourteen) days. [name], MD  Active   cyanocobalamin (,VITAMIN B-12,) 1000 MCG/ML injection [id number]  Inject 1 mL (1,000 mcg total) into the muscle every 30 (thirty) days. Inject 1 ml (1000 mcg ) IM weekly x 4,  Then monthly thereafter [name], MD  Active   dapagliflozin propanediol (FARXIGA) 10 MG TABS tablet [id number] Yes Take 10 mg by  mouth daily. [provider] Taking Active Self  diphenoxylate-atropine (LOMOTIL) 2.5-0.025 MG tablet [id number]  Take 1 tablet by mouth 4 (four) times daily as needed for diarrhea or loose stools. [name], MD  Active   erythromycin ophthalmic ointment [id number]  1 application at bedtime. [provider]  Active   fexofenadine (ALLEGRA) 180 MG tablet [id number]  Take 180 mg by mouth daily. [provider]  Active Self  insulin aspart (NOVOLOG FLEXPEN) 100 UNIT/ML FlexPen [id number]  Inject 10-20 units up to three times daily  before meals; If pre-meal glucose 100-150 - inject 10 units, 151-200 - inject 12 units; 201-250 - inject 14 units; 251-300 - inject 16 units, 301-350 - inject 18 units ; 350 > inject 20  units Tullo, Aris Everts, MD  Active   insulin degludec (TRESIBA FLEXTOUCH) 100 UNIT/ML FlexTouch Pen [id number] Yes Inject 15 Units into the skin daily. [name], MD  Active   Insulin Pen Needle (DROPLET PEN NEEDLES) 31G X 5 MM MISC [id number]  USE ONE NEEDLE SUBCUTANEOUSLY AS DIRECTED (REMOVE AND DISCARD NEEDLE IN SHARPS CONTAINER IMMEDIATELY AFTER USE) [name], MD  Active   KLOR-CON M20 20 MEQ tablet [id number]  TAKE 1 TABLET BY MOUTH EVERY DAY End, Christopher, MD  Active Self  Lidocaine 4 % PTCH [id number]  Apply topically daily. [provider]  Active   metolazone (ZAROXOLYN) 2.5 MG tablet [id number]  Take 2.5 mg by mouth daily. [provider]  Active   nitroGLYCERIN (NITROSTAT) 0.4 MG SL tablet [id number]  Place 1 tablet (0.4 mg total) under the tongue every 5 (five) minutes as needed for chest pain. Theora Gianotti, NP  Active Self  NONFORMULARY OR COMPOUNDED [place] [id number]  [place]  Combination Pain Cream -  Baclofen 2%, Doxepin 5%, Gabapentin 6%, Topiramate 2%, Pentoxifylline 3% Apply 1-2 grams to affected area 3-4 times daily Qty. 120 gm 3 refills Gardiner Barefoot, DPM  Active Self           Med Note  [facility], BRANDY L   Fri Sep 02, 2020  2:40 PM)    nystatin (MYCOSTATIN/NYSTOP) powder [id number]  Apply 1 g topically 4 (four) times daily as needed (rash). [provider]  Active Self  ondansetron (ZOFRAN-ODT) 4 MG disintegrating tablet [id number]  DISSOLVE ONE TABLET ON TONGUE EVERY 8 HOURS AS NEEDED FOR NAUSEA OR VOMITING [name], MD  Active   Polyethyl Glycol-Propyl Glycol (SYSTANE) 0.4-0.3 % GEL ophthalmic gel [id number]  Place 1 application into both eyes. [provider]  Active   pregabalin (LYRICA) 50 MG capsule [id number]  Take 1 capsule (50 mg total) by mouth 2 (two) times daily. Kathrynn Ducking, MD  Active   Semaglutide,0.25 or 0.5MG/DOS, (OZEMPIC, 0.25 OR 0.5 MG/DOSE,) 2 MG/1.5ML SOPN [id number]  Inject 0.5 mg into the skin once a week. [name], MD  Active   Discontinued 03/14/21 1654 (Completed Course)   torsemide (DEMADEX) 10 MG tablet [id number]  Take 1 tablet (10 mg total) by mouth daily. Kathlen Mody, Cadence H, PA-C  Active Self  valACYclovir (VALTREX) 1000 MG tablet [id number] Yes Take 1 tablet (1,000 mg total) by mouth 2 (two) times daily for 7 days. Serafina Royals, FNP Taking Active   Med List Note Modena Nunnery, [place]th[place] T, [place] 07/22/17 1307): CPAP            Patient Active Problem List   Diagnosis Date Noted   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/09/2021   Diabetes mellitus ([place]) 02/16/2021   Fall 02/16/2021   PSVT (paroxysmal supraventricular tachycardia) ([place]) 12/15/2020   CKD stage 3 due to type 2 diabetes mellitus ([place]) 12/09/2020   Temporal arteritis syndrome ([place]) 11/17/2020   Other pulmonary embolism with acute cor pulmonale, unspecified chronicity ([place]) 11/17/2020   Mild episode of recurrent major depressive disorder ([place]) 11/17/2020   Hyperlipidemia associated with type 2 diabetes mellitus ([place]) 08/21/2020   Iron deficiency 06/28/2020   Skin lesion of chest wall 06/10/2020   Elevated alkaline phosphatase level 06/10/2020   Coagulation  defect ([place]) 06/06/2020  Chronic heart failure with preserved ejection fraction (HFpEF) ([place]) 01/14/2020   Hyperlipidemia LDL goal <70 01/14/2020   Coronary artery disease involving native coronary artery of native heart without angina pectoris 01/13/2020   S/P cardiac catheterization 01/13/2020   Accelerating angina (HCC) 12/31/2019   Dysphagia 11/30/2019   Bladder pain 08/23/2019   Gastritis 08/07/2019   Lab test negative for COVID-19 virus 08/07/2019   S/P panniculectomy 06/16/2019   Major depressive disorder with current active episode 05/30/2019   Exposure to COVID-19 virus 12/25/2018   Functional diarrhea 10/22/2018   Bilateral cataracts 06/26/2018   Status post bariatric surgery 02/05/2018   GAD (generalized anxiety disorder) 02/01/2018   Rectocele 01/24/2018   Vaginal prolapse 01/04/2018   Hepatic steatosis 01/04/2018   Microalbuminuria due to type 2 diabetes mellitus ([place]) 11/21/2017   Hospital discharge follow-up 09/22/2017   Hypocalcemia 09/22/2017   Hypotension 09/22/2017   Fecal incontinence 09/22/2017   B12 deficiency 09/22/2017   Transient disorientation 08/20/2017   Rotator cuff arthropathy, right 08/13/2017   Primary localized osteoarthrosis of shoulder 08/13/2017   Encounter for preoperative examination for general surgical procedure 08/03/2017   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 ([place]) 08/03/2017   Candidiasis, intertrigo 08/03/2017   Acute pain due to injury 03/05/2017   Venous stasis dermatitis of right lower extremity 11/21/2016   Hypersomnia, persistent 06/18/2015   Mechanical breakdown of implanted electronic neurostimulator of peripheral nerve ([place]) 10/16/2014   Obesity hypoventilation syndrome ([place]) 08/13/2014   Frequent falls 06/23/2014   Chronic cough 05/05/2014   Adenoma of left adrenal gland 03/24/2014   New onset of headaches after age 77 03/23/2014   Vitamin D deficiency 01/06/2014   Weight gain following gastric bypass  surgery 12/03/2013   Morbid obesity ([place]) 08/25/2013   Weakness 08/04/2013   Diaphragmatic hernia 04/06/2013   Diabetic polyneuropathy ([place]) 03/25/2013   Abnormality of gait 03/25/2013   Multiple pulmonary nodules 03/20/2013   DOE (dyspnea on exertion) 03/20/2013   Iron deficiency anemia 08/06/2012   Functional disorder of bladder 07/15/2012   Incomplete bladder emptying 07/15/2012   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[place]) 07/15/2012   Urge incontinence 07/15/2012   Increased frequency of urination 07/15/2012   OSA on CPAP 03/02/2012   Insomnia secondary to anxiety 02/29/2012   Sciatica 09/05/2011   Cervical stenosis of spinal canal 06/14/2011   Restless legs syndrome 03/13/2011   Essential hypertension 03/12/2011   Type 2 diabetes mellitus with diabetic neuropathy, unspecified ([place]) 03/12/2011   Hearing loss, right 03/12/2011    Immunization History  Administered Date(s) Administered   Fluad Quad(high Dose 65+) 03/06/2019, 06/07/2020   Influenza Split 06/13/2011   Influenza, High Dose Seasonal PF 08/21/2018   Influenza,inj,Quad PF,6+ Mos 05/04/2014, 05/12/2015   Influenza-Unspecified 04/15/2012   Moderna Sars-Covid-2 Vaccination 02/02/2020, 03/01/2020, 09/01/2020   Pneumococcal Conjugate-13 08/11/2013, 01/06/2014   Pneumococcal Polysaccharide-23 01/19/2010, 09/03/2018   Tdap 04/18/2016, 11/20/2020    Conditions to be addressed/monitored: CHF, HTN, HLD, and DMII  Care Plan : Medication Management  Updates made by De Hollingshead, RPH-CPP since 03/14/2021 12:00 AM     Problem: Diabetes, CHF      Long-Range Goal: Disease Progression Prevention   This Visit's Progress: On track  Recent Progress: On track  Priority: High  Note:   Current Barriers:  Unable to achieve control of diabetes   Pharmacist Clinical Goal(s):  Over the next 90 days, patient will achieve control of diabetes as evidenced by improvement in A1c through collaboration with PharmD and  provider  Interventions: 1:1 collaboration with [name], MD regarding development and update of comprehensive plan of care as evidenced by provider attestation and co-signature Inter-disciplinary care team collaboration (see longitudinal plan of care) Comprehensive medication review performed; medication list updated in electronic medical record  Health Maintenance Yearly diabetic eye exam: due - follow for results of current retinal work up Yearly diabetic foot exam: up to date Urine microalbumin: up to date Yearly influenza vaccination: due Td/Tdap vaccination: up to date Pneumonia vaccination: up to date COVID vaccinations: up to date Shingrix vaccinations: due - will discuss after resolution of acute episode Colonoscopy: up to date   SDOH: Significant stress lately. Reports lots of provider appointments, in addition to her hot water tank bursting, currently staying at hotel while that is being prepared. Requests letter from PCP noting that her dog is an emotional support animal. Will collaborate w/ PCP.   Diabetes: Uncontrolled; current treatment: Farxiga 10 mg daily, Lantus 15 units QPM, Novolog 8-16 units up to TID with meals, Ozempic 0.5 mg weekly Denies GI upset, diarrhea, constipation with Ozempic. Notes some decrease appetite.  Hx intolerance to metformin d/t diarrhea Hx Trulicity - exacerbated diarrhea  Hx glipizide - stopped previously when A1c was well controlled  Hx Januvia - stopped previously when Trulicity was started  Avoiding TZD due to HF Current glucose readings: using Libre 2 CGM Date of Download: 03/01/21-03/14/21 % Time CGM is active: 77% Average Glucose: 209 mg/dL Glucose Management Indicator: 8.3  Glucose Variability: 37.9 (goal <36%) Time in Goal:  - Time in range 70-180: 42% - Time above range: 58% - Time below range: 0% Observed patterns: post prandial elevations. Increased elevations throughout the day.  Reports that she needs a refill on  Palominas 2 sensors. Submitted refill request to [place] via [facility] Discussed that transition to a basal insulin with a longer duration of action may provide better glucose lowering throughout the day. Contacted [employer], Tyler Aas is covered. Stop Lantus, start Tresiba 15 units daily.  Increase Novolog sliding scale by 2 units. Inject 10-20 units up to three times daily before meals; If pre-meal glucose 100-150 - inject 10 units, 151-200 - inject 12 units; 201-250 - inject 14 units; 251-300 - inject 16 units, 301-350 - inject 18 units ; 350 > inject 20 units Continue Farxiga 10 mg daily for DM, HF, CKD.  Reports that [employer] told her they would not cover Ozempic. She received an order earlier this month. Called [employer], they note that Ozempic is on formulary. Sent script for 3 month supply.   HFpEF in the setting of CKD: Appropriately managed; current regimen: torsemide 10 mg daily; potassium 20 mEq daily, metoprolol tartrate 12.5 mg BID; established with cardiology, advanced HF clinic, nephrology Follow for medication changes at discharge.   Heart Failure: Appropriately managed; current treatment:  ARNI/ACEi/ARB: none, hx intolerance to ARBs due to nausea, upcoming appointment with nephrology Beta blocker: metoprolol tartrate 12.5 mg BID SGLT2: Farxiga 10 mg daily Mineralocorticoid Receptor Antagonist: none d/t eGFR Diuretic: torsemide 10 mg daily Recommended to continue current regimen at this time along with collaboration with cardiology, nephrology.   Hyperlipidemia, secondary ASCVD prevention: Controlled per last lab result; current treatment: atorvastatin 40 mg daily  Current antiplatelet regimen: aspirin 81 mg daily, clopidogrel 75 mg daily Recommended to continue current regimen at this time  Nocturnal "leg cramps", as well as chronic pain, Current Shingles More exacerbated recently per patient report. Current regimen: pregabalin 50 mg BID increased from daily due to  Shingles. Completing valacyclovir 1 g BID (dose reduced d/t renal function); established with Dr. Jannifer Franklin neurology.  Reports a visit with eye doctor today who did have concerns about retinal involvement of shingles, referred to retinal specialist in [place]. Patient asks if she needs a longer duration of valacyclovir while waiting to get in with retinal specialist. Discussed with PCP. Encouraged patient to determine this with eye doctor who saw her today.  Does report increased sedation with increased pregabalin dose, but also reports she has not been sleeping well recently.  Recommended to continue current regimen at this time.   Chronic Diarrhea, S/p Gastric Bypass, Esophageal "growth" Moderately well managed, given patient history. Current regimen: hyoscyamine 10 mg QID PRN; also using lomotil 4 tabs daily and loperamide BID Recommended to continue current regimen at this time  COPD, allergies:  Well controlled; Current regimen: Anoro 62.5/25 mcg daily, albuterol HFA PRN; fexofenadine 180 mg daily  Recommended to continue current regimen at this time.  Patient Goals/Self-Care Activities Over the next 90 days, patient will:  - take medications as prescribed check blood glucose at least three times daily using CGM, document, and provide at future appointments  Follow Up Plan: Face to Face appointment with care management team member scheduled for:  ~ 8 weeks     Medication Assistance: None required.  Patient affirms current coverage meets needs.  Patient's preferred pharmacy is:  [name] [address]: [phone]: [phone] [facility] [place] [address] [place] [address]Phone: [phone]: [phone] [facility] [place] [address]Phone: [phone]:  [phone]  Follow Up:  Patient agrees to Care Plan and Follow-up.  Plan: Face to Face appointment with care management team member scheduled for: ~ 8 weeks  Catie TDarnelle Maffucci PharmD, BPort St. John CAshbyClinical Pharmacist LOccidental Petroleumat BJohnson & [phone]

## 2021-03-15 DIAGNOSIS — I5030 Unspecified diastolic (congestive) heart failure: Secondary | ICD-10-CM | POA: Diagnosis not present

## 2021-03-15 DIAGNOSIS — E1142 Type 2 diabetes mellitus with diabetic polyneuropathy: Secondary | ICD-10-CM | POA: Diagnosis not present

## 2021-03-15 DIAGNOSIS — E11610 Type 2 diabetes mellitus with diabetic neuropathic arthropathy: Secondary | ICD-10-CM | POA: Diagnosis not present

## 2021-03-15 DIAGNOSIS — H18413 Arcus senilis, bilateral: Secondary | ICD-10-CM | POA: Diagnosis not present

## 2021-03-15 DIAGNOSIS — E1122 Type 2 diabetes mellitus with diabetic chronic kidney disease: Secondary | ICD-10-CM | POA: Diagnosis not present

## 2021-03-15 DIAGNOSIS — I110 Hypertensive heart disease with heart failure: Secondary | ICD-10-CM | POA: Diagnosis not present

## 2021-03-15 DIAGNOSIS — N183 Chronic kidney disease, stage 3 unspecified: Secondary | ICD-10-CM | POA: Diagnosis not present

## 2021-03-15 DIAGNOSIS — B0239 Other herpes zoster eye disease: Secondary | ICD-10-CM | POA: Diagnosis not present

## 2021-03-15 DIAGNOSIS — Z961 Presence of intraocular lens: Secondary | ICD-10-CM | POA: Diagnosis not present

## 2021-03-15 DIAGNOSIS — E119 Type 2 diabetes mellitus without complications: Secondary | ICD-10-CM | POA: Diagnosis not present

## 2021-03-16 DIAGNOSIS — R82998 Other abnormal findings in urine: Secondary | ICD-10-CM | POA: Diagnosis not present

## 2021-03-16 DIAGNOSIS — I10 Essential (primary) hypertension: Secondary | ICD-10-CM | POA: Diagnosis not present

## 2021-03-16 DIAGNOSIS — E1122 Type 2 diabetes mellitus with diabetic chronic kidney disease: Secondary | ICD-10-CM | POA: Diagnosis not present

## 2021-03-16 DIAGNOSIS — G4733 Obstructive sleep apnea (adult) (pediatric): Secondary | ICD-10-CM | POA: Diagnosis not present

## 2021-03-16 DIAGNOSIS — R609 Edema, unspecified: Secondary | ICD-10-CM | POA: Diagnosis not present

## 2021-03-16 DIAGNOSIS — I509 Heart failure, unspecified: Secondary | ICD-10-CM | POA: Diagnosis not present

## 2021-03-20 ENCOUNTER — Other Ambulatory Visit: Payer: Self-pay | Admitting: Internal Medicine

## 2021-03-21 ENCOUNTER — Ambulatory Visit: Payer: Medicare Other | Admitting: Pharmacist

## 2021-03-21 ENCOUNTER — Telehealth: Payer: Self-pay | Admitting: Pharmacist

## 2021-03-21 DIAGNOSIS — N183 Type 2 diabetes mellitus with diabetic chronic kidney disease: Secondary | ICD-10-CM

## 2021-03-21 DIAGNOSIS — B0229 Other postherpetic nervous system involvement: Secondary | ICD-10-CM

## 2021-03-21 DIAGNOSIS — E1169 Type 2 diabetes mellitus with other specified complication: Secondary | ICD-10-CM

## 2021-03-21 DIAGNOSIS — E1122 Type 2 diabetes mellitus with diabetic chronic kidney disease: Secondary | ICD-10-CM

## 2021-03-21 DIAGNOSIS — E1142 Type 2 diabetes mellitus with diabetic polyneuropathy: Secondary | ICD-10-CM

## 2021-03-21 MED ORDER — DAPAGLIFLOZIN PROPANEDIOL 10 MG PO TABS: 10.0000 mg | ORAL_TABLET | Freq: Every day | ORAL | 3 refills | Status: DC

## 2021-03-21 NOTE — Telephone Encounter (Signed)
Medication Samples have been labeled and logged for the patient and placed at the front desk  Drug name: Wilder Glade       Strength: 10 mg        Qty: 3 boxes  UDJ:[id number]  Exp.Date: 06/15/23  Dosing instructions: Take 1 tablet by mouth daily  The patient has been instructed regarding the correct time, dose, and frequency of taking this medication, including desired effects and most common side effects.   De Hollingshead 12:46 PM 03/21/2021

## 2021-03-21 NOTE — Chronic Care Management (AMB) (Signed)
Chronic Care Management Pharmacy Note  03/21/2021 Name:  Denise Sharp MRN:  [id number] DOB:  05/14/1946   Subjective: Denise Sharp is an 75 y.o. year old female who is a primary patient of [name], Aris Everts, MD.  The CCM team was consulted for assistance with disease management and care coordination needs.    Engaged with patient by telephone for  medication access question  in response to provider referral for pharmacy case management and/or care coordination services.   Consent to Services:  The patient was given information about Chronic Care Management services, agreed to services, and gave verbal consent prior to initiation of services.  Please see initial visit note for detailed documentation.   Patient Care Team: [name], MD as PCP - General (Internal Medicine) End, Harrell Gave, MD as PCP - Cardiology (Cardiology) Chiropractic, Freddi Che as Referring Physician De Hollingshead, RPH-CPP (Pharmacist)    Objective:  Lab Results  Component Value Date   CREATININE 1.34 (H) 02/10/2021   CREATININE 1.47 (H) 02/09/2021   CREATININE 1.48 (H) 02/09/2021    Lab Results  Component Value Date   HGBA1C 8.9 (H) 02/08/2021   Last diabetic Eye exam:  Lab Results  Component Value Date/Time   HMDIABEYEEXA No Retinopathy 10/15/2016 12:00 AM    Last diabetic Foot exam:  Lab Results  Component Value Date/Time   HMDIABFOOTEX abnormal 06/23/2014 12:00 AM        Component Value Date/Time   CHOL 134 05/24/2020 1532   CHOL 155 02/27/2014 0420   TRIG 128.0 05/24/2020 1532   TRIG 121 02/27/2014 0420   HDL 60.40 05/24/2020 1532   HDL 43 02/27/2014 0420   CHOLHDL 2 05/24/2020 1532   VLDL 25.6 05/24/2020 1532   VLDL 24 02/27/2014 0420   LDLCALC 48 05/24/2020 1532   LDLCALC 88 02/27/2014 0420   LDLDIRECT 111.0 06/16/2015 0936    Hepatic Function Latest Ref Rng & Units 02/08/2021 01/27/2021 07/17/2020  Total Protein 6.5 - 8.1 g/dL 7.3 7.5 7.6  Albumin 3.5 - 5.0  g/dL 3.3(L) 3.6 3.5  AST 15 - 41 U/L 42(H) 27 18  ALT 0 - 44 U/L _0 Alk Phosphatase 38 - 126 U/L 102 111 129(H)  Total Bilirubin 0.3 - 1.2 mg/dL 1.9(H) 0.7 0.5  Bilirubin, Direct 0.0 - 0.2 mg/dL - <0.1 -    Lab Results  Component Value Date/Time   TSH 2.209 02/08/2021 06:55 PM   TSH 3.695 07/17/2020 11:50 AM   TSH 3.50 01/01/2019 01:45 PM   TSH 4.400 06/16/2015 09:36 AM   FREET4 1.10 05/03/2015 04:20 PM    CBC Latest Ref Rng & Units 02/09/2021 02/08/2021 01/27/2021  WBC 4.0 - 10.5 K/uL 9.2 14.2(H) 11.5(H)  Hemoglobin 12.0 - 15.0 g/dL 13.2 15.2(H) 14.8  Hematocrit 36.0 - 46.0 % 39.9 44.9 45.0  Platelets 150 - 400 K/uL 238 279 286    Lab Results  Component Value Date/Time   VD25OH 21.65 (L) 06/04/2017 02:12 PM   VD25OH 23.44 (L) 11/19/2016 02:31 PM    Clinical ASCVD: Yes  The 10-year ASCVD risk score Mikey Bussing DC Jr., et al., 2013) is: 31.2%   Values used to calculate the score:     Age: 75 years     Sex: Female     Is Non-Hispanic African American: No     Diabetic: Yes     Tobacco smoker: No     Systolic Blood Pressure: 154 mmHg     Is BP treated:  Yes     HDL Cholesterol: 60.4 mg/dL     Total Cholesterol: 134 mg/dL     Social History   Tobacco Use  Smoking Status Never  Smokeless Tobacco Never   BP Readings from Last 3 Encounters:  03/09/21 118/70  03/08/21 114/75  02/28/21 105/70   Pulse Readings from Last 3 Encounters:  03/09/21 89  03/08/21 84  02/28/21 80   Wt Readings from Last 3 Encounters:  03/09/21 241 lb (109.3 kg)  02/28/21 236 lb (107 kg)  02/17/21 232 lb 12.8 oz (105.6 kg)    Assessment: Review of patient past medical history, allergies, medications, health status, including review of consultants reports, laboratory and other test data, was performed as part of comprehensive evaluation and provision of chronic care management services.   SDOH:  (Social Determinants of Health) assessments and interventions performed:  SDOH Interventions     Flowsheet Row Most Recent Value  SDOH Interventions   Financial Strain Interventions Intervention Not Indicated       CCM Care Plan  Allergies  Allergen Reactions   Demeclocycline Hives   Erythromycin Nausea And Vomiting and Other (See Comments)    Severe irritable bowel   Flagyl [Metronidazole] Nausea And Vomiting and Other (See Comments)    Severe irritable bowel   Glucophage [Metformin Hcl] Nausea And Vomiting and Other (See Comments)    "Sick" "I won't take anything that has metformin in it"   Tetracyclines & Related Hives and Rash   Diovan [Valsartan] Nausea Only        Sulfa Antibiotics Rash and Other (See Comments)    As child   Xanax [Alprazolam] Other (See Comments)    Unknown reaction    Medications Reviewed Today     Reviewed by De Hollingshead, RPH-CPP (Pharmacist) on 03/14/21 at 1654  Med List Status: <None>   Medication Order Taking? Sig Documenting Provider Last Dose Status Informant  aspirin EC 81 MG tablet [id number]  Take 1 tablet (81 mg total) by mouth daily. Fritzi Mandes, MD  Active Self  atorvastatin (LIPITOR) 40 MG tablet [id number]  Take 1 tablet (40 mg total) by mouth daily. [name], MD  Active   clotrimazole-betamethasone Donalynn Furlong) cream [id number]  Apply 1 application topically 2 (two) times daily as needed. [provider]  Active Self  Continuous Blood Gluc Receiver (FREESTYLE LIBRE 2 READER) DEVI [id number]  Use to check sugar at least TID [name], MD  Active Self  Continuous Blood Gluc Sensor (FREESTYLE LIBRE 2 SENSOR) MISC [id number]  1 application by Does not apply route every 14 (fourteen) days. [name], MD  Active   cyanocobalamin (,VITAMIN B-12,) 1000 MCG/ML injection [id number]  Inject 1 mL (1,000 mcg total) into the muscle every 30 (thirty) days. Inject 1 ml (1000 mcg ) IM weekly x 4,  Then monthly thereafter [name], MD  Active   dapagliflozin propanediol (FARXIGA) 10 MG TABS tablet [id number] Yes  Take 10 mg by mouth daily. [provider] Taking Active Self  diphenoxylate-atropine (LOMOTIL) 2.5-0.025 MG tablet [id number]  Take 1 tablet by mouth 4 (four) times daily as needed for diarrhea or loose stools. [name], MD  Active   erythromycin ophthalmic ointment [id number]  1 application at bedtime. [provider]  Active   fexofenadine (ALLEGRA) 180 MG tablet [id number]  Take 180 mg by mouth daily. [provider]  Active Self  insulin aspart (NOVOLOG FLEXPEN) 100 UNIT/ML FlexPen [id number]  Inject 10-20 units  up to three times daily before meals; If pre-meal glucose 100-150 - inject 10 units, 151-200 - inject 12 units; 201-250 - inject 14 units; 251-300 - inject 16 units, 301-350 - inject 18 units ; 350 > inject 20  units Tullo, Aris Everts, MD  Active   insulin degludec (TRESIBA FLEXTOUCH) 100 UNIT/ML FlexTouch Pen [id number] Yes Inject 15 Units into the skin daily. [name], MD  Active   Insulin Pen Needle (DROPLET PEN NEEDLES) 31G X 5 MM MISC [id number]  USE ONE NEEDLE SUBCUTANEOUSLY AS DIRECTED (REMOVE AND DISCARD NEEDLE IN SHARPS CONTAINER IMMEDIATELY AFTER USE) [name], MD  Active   KLOR-CON M20 20 MEQ tablet [id number]  TAKE 1 TABLET BY MOUTH EVERY DAY End, Christopher, MD  Active Self  Lidocaine 4 % PTCH [id number]  Apply topically daily. [provider]  Active   metolazone (ZAROXOLYN) 2.5 MG tablet [id number]  Take 2.5 mg by mouth daily. [provider]  Active   nitroGLYCERIN (NITROSTAT) 0.4 MG SL tablet [id number]  Place 1 tablet (0.4 mg total) under the tongue every 5 (five) minutes as needed for chest pain. Theora Gianotti, NP  Active Self  NONFORMULARY OR COMPOUNDED [place] [id number]  [place]  Combination Pain Cream -  Baclofen 2%, Doxepin 5%, Gabapentin 6%, Topiramate 2%, Pentoxifylline 3% Apply 1-2 grams to affected area 3-4 times daily Qty. 120 gm 3 refills Gardiner Barefoot, DPM  Active Self           Med  Note [facility], BRANDY L   Fri Sep 02, 2020  2:40 PM)    nystatin (MYCOSTATIN/NYSTOP) powder [id number]  Apply 1 g topically 4 (four) times daily as needed (rash). [provider]  Active Self  ondansetron (ZOFRAN-ODT) 4 MG disintegrating tablet [id number]  DISSOLVE ONE TABLET ON TONGUE EVERY 8 HOURS AS NEEDED FOR NAUSEA OR VOMITING [name], MD  Active   Polyethyl Glycol-Propyl Glycol (SYSTANE) 0.4-0.3 % GEL ophthalmic gel [id number]  Place 1 application into both eyes. [provider]  Active   pregabalin (LYRICA) 50 MG capsule [id number]  Take 1 capsule (50 mg total) by mouth 2 (two) times daily. Kathrynn Ducking, MD  Active   Semaglutide,0.25 or 0.5MG/DOS, (OZEMPIC, 0.25 OR 0.5 MG/DOSE,) 2 MG/1.5ML SOPN [id number]  Inject 0.5 mg into the skin once a week. [name], MD  Active   Discontinued 03/14/21 1654 (Completed Course)   torsemide (DEMADEX) 10 MG tablet [id number]  Take 1 tablet (10 mg total) by mouth daily. Kathlen Mody, Cadence H, PA-C  Active Self  valACYclovir (VALTREX) 1000 MG tablet [id number] Yes Take 1 tablet (1,000 mg total) by mouth 2 (two) times daily for 7 days. Serafina Royals, FNP Taking Active   Med List Note [place] Nunnery, [place] T, [place] 07/22/17 1307): CPAP            Patient Active Problem List   Diagnosis Date Noted   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/09/2021   Diabetes mellitus ([place]) 02/16/2021   Fall 02/16/2021   PSVT (paroxysmal supraventricular tachycardia) ([place]) 12/15/2020   CKD stage 3 due to type 2 diabetes mellitus ([place]) 12/09/2020   Temporal arteritis syndrome ([place]) 11/17/2020   Other pulmonary embolism with acute cor pulmonale, unspecified chronicity ([place]) 11/17/2020   Mild episode of recurrent major depressive disorder ([place]) 11/17/2020   Hyperlipidemia associated with type 2 diabetes mellitus ([place]) 08/21/2020   Iron deficiency 06/28/2020   Skin lesion of chest wall 06/10/2020   Elevated alkaline phosphatase level 06/10/2020  Coagulation defect ([place]) 06/06/2020   Chronic heart failure with preserved ejection fraction (HFpEF) ([place]) 01/14/2020   Hyperlipidemia LDL goal <70 01/14/2020   Coronary artery disease involving native coronary artery of native heart without angina pectoris 01/13/2020   S/P cardiac catheterization 01/13/2020   Accelerating angina (HCC) 12/31/2019   Dysphagia 11/30/2019   Bladder pain 08/23/2019   Gastritis 08/07/2019   Lab test negative for COVID-19 virus 08/07/2019   S/P panniculectomy 06/16/2019   Major depressive disorder with current active episode 05/30/2019   Exposure to COVID-19 virus 12/25/2018   Functional diarrhea 10/22/2018   Bilateral cataracts 06/26/2018   Status post bariatric surgery 02/05/2018   GAD (generalized anxiety disorder) 02/01/2018   Rectocele 01/24/2018   Vaginal prolapse 01/04/2018   Hepatic steatosis 01/04/2018   Microalbuminuria due to type 2 diabetes mellitus ([place]) 11/21/2017   Hospital discharge follow-up 09/22/2017   Hypocalcemia 09/22/2017   Hypotension 09/22/2017   Fecal incontinence 09/22/2017   B12 deficiency 09/22/2017   Transient disorientation 08/20/2017   Rotator cuff arthropathy, right 08/13/2017   Primary localized osteoarthrosis of shoulder 08/13/2017   Encounter for preoperative examination for general surgical procedure 08/03/2017   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 ([place]) 08/03/2017   Candidiasis, intertrigo 08/03/2017   Acute pain due to injury 03/05/2017   Venous stasis dermatitis of right lower extremity 11/21/2016   Hypersomnia, persistent 06/18/2015   Mechanical breakdown of implanted electronic neurostimulator of peripheral nerve ([place]) 10/16/2014   Obesity hypoventilation syndrome ([place]) 08/13/2014   Frequent falls 06/23/2014   Chronic cough 05/05/2014   Adenoma of left adrenal gland 03/24/2014   New onset of headaches after age 40 03/23/2014   Vitamin D deficiency 01/06/2014   Weight gain following  gastric bypass surgery 12/03/2013   Morbid obesity ([place]) 08/25/2013   Weakness 08/04/2013   Diaphragmatic hernia 04/06/2013   Diabetic polyneuropathy ([place]) 03/25/2013   Abnormality of gait 03/25/2013   Multiple pulmonary nodules 03/20/2013   DOE (dyspnea on exertion) 03/20/2013   Iron deficiency anemia 08/06/2012   Functional disorder of bladder 07/15/2012   Incomplete bladder emptying 07/15/2012   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[place]) 07/15/2012   Urge incontinence 07/15/2012   Increased frequency of urination 07/15/2012   OSA on CPAP 03/02/2012   Insomnia secondary to anxiety 02/29/2012   Sciatica 09/05/2011   Cervical stenosis of spinal canal 06/14/2011   Restless legs syndrome 03/13/2011   Essential hypertension 03/12/2011   Type 2 diabetes mellitus with diabetic neuropathy, unspecified ([place]) 03/12/2011   Hearing loss, right 03/12/2011    Immunization History  Administered Date(s) Administered   Fluad Quad(high Dose 65+) 03/06/2019, 06/07/2020   Influenza Split 06/13/2011   Influenza, High Dose Seasonal PF 08/21/2018   Influenza,inj,Quad PF,6+ Mos 05/04/2014, 05/12/2015   Influenza-Unspecified 04/15/2012   Moderna Sars-Covid-2 Vaccination 02/02/2020, 03/01/2020, 09/01/2020   Pneumococcal Conjugate-13 08/11/2013, 01/06/2014   Pneumococcal Polysaccharide-23 01/19/2010, 09/03/2018   Tdap 04/18/2016, 11/20/2020    Conditions to be addressed/monitored: CHF and DMII  Care Plan : Medication Management  Updates made by De Hollingshead, RPH-CPP since 03/21/2021 12:00 AM     Problem: Diabetes, CHF      Long-Range Goal: Disease Progression Prevention   This Visit's Progress: On track  Recent Progress: On track  Priority: High  Note:   Current Barriers:  Unable to achieve control of diabetes   Pharmacist Clinical Goal(s):  Over the next 90 days, patient will achieve control of diabetes as evidenced by improvement in A1c through collaboration with PharmD  and  provider  Interventions: 1:1 collaboration with [name], MD regarding development and update of comprehensive plan of care as evidenced by provider attestation and co-signature Inter-disciplinary care team collaboration (see longitudinal plan of care) Comprehensive medication review performed; medication list updated in electronic medical record  Health Maintenance Yearly diabetic eye exam: due - follow for results of current retinal work up Yearly diabetic foot exam: up to date Urine microalbumin: up to date Yearly influenza vaccination: due Td/Tdap vaccination: up to date Pneumonia vaccination: up to date COVID vaccinations: up to date Shingrix vaccinations: due - will discuss after resolution of acute episode Colonoscopy: up to date   SDOH: Significant stress lately.   Diabetes: Uncontrolled; current treatment: Farxiga 10 mg daily, Tresiba 15 units daily, Novolog 10-20 units up to TID with meals, Ozempic 0.5 mg weekly Denies GI upset, diarrhea, constipation with Ozempic. Notes some decrease appetite.  Hx intolerance to metformin d/t diarrhea Hx Trulicity - exacerbated diarrhea  Hx glipizide - stopped previously when A1c was well controlled  Hx Januvia - stopped previously when Trulicity was started  Avoiding TZD due to HF Current glucose readings: using Libre 2 CGM Reports that she ran out of [place], she assumed [facility] would send a refill automatically. Requested a refill be sent to [facility]. Prepared sample to tide her over until supply can arrive.  Reviewed that Tyler Aas will replace Lantus. She verbalized understanding.   HFpEF in the setting of CKD: Appropriately managed; current regimen: torsemide 10 mg daily; potassium 20 mEq daily, metoprolol tartrate 12.5 mg BID; established with cardiology, advanced HF clinic, nephrology Follow for medication changes at discharge.   Heart Failure: Appropriately managed; current treatment:  ARNI/ACEi/ARB: none, hx intolerance  to ARBs due to nausea, upcoming appointment with nephrology Beta blocker: metoprolol tartrate 12.5 mg BID SGLT2: Farxiga 10 mg daily Mineralocorticoid Receptor Antagonist: none d/t eGFR Diuretic: torsemide 10 mg daily Recommended to continue current regimen at this time along with collaboration with cardiology, nephrology.   Hyperlipidemia, secondary ASCVD prevention: Controlled per last lab result; current treatment: atorvastatin 40 mg daily  Current antiplatelet regimen: aspirin 81 mg daily, clopidogrel 75 mg daily Recommended to continue current regimen at this time  Nocturnal "leg cramps", as well as chronic pain, Current Shingles More exacerbated recently per patient report. Current regimen: pregabalin 50 mg BID increased from daily due to Shingles. Completing valacyclovir 1 g BID (dose reduced d/t renal function); established with Dr. Jannifer Franklin neurology.  Reports a visit with eye doctor today who did have concerns about retinal involvement of shingles, referred to retinal specialist in [place]. Patient asks if she needs a longer duration of valacyclovir while waiting to get in with retinal specialist. Discussed with PCP. Encouraged patient to determine this with eye doctor who saw her today.  Does report increased sedation with increased pregabalin dose, but also reports she has not been sleeping well recently.  Recommended to continue current regimen at this time.  Chronic Diarrhea, S/p Gastric Bypass, Esophageal "growth" Moderately well managed, given patient history. Current regimen: hyoscyamine 10 mg QID PRN; also using lomotil 4 tabs daily and loperamide BID Recommended to continue current regimen at this time  COPD, allergies:  Well controlled; Current regimen: Anoro 62.5/25 mcg daily, albuterol HFA PRN; fexofenadine 180 mg daily  Recommended to continue current regimen at this time.  Patient Goals/Self-Care Activities Over the next 90 days, patient will:  - take medications  as prescribed check blood glucose at least three times daily using CGM, document,  and provide at future appointments  Follow Up Plan: Face to Face appointment with care management team member scheduled for:  ~ 8 weeks     Medication Assistance: None required.  Patient affirms current coverage meets needs.  Patient's preferred pharmacy is:  [name] [place]: [phone]: [phone] [facility] [place]- [address] [place] [address]Phone: [phone]: [phone] [facility] [place] [place] [address]Phone: [phone]: [phone] Follow Up:  Patient agrees to Care Plan and Follow-up.  Plan: Telephone follow up appointment with care management team member scheduled for:  ~ 8 weeks  Catie TDarnelle Maffucci PharmD, [place] CGoldenClinical Pharmacist LOccidental Petroleumat BJohnson [employer] [phone]

## 2021-03-21 NOTE — Telephone Encounter (Signed)
Spoke to patient. See CCM documentation

## 2021-03-21 NOTE — Patient Instructions (Signed)
Visit Information  PATIENT GOALS:  Goals Addressed               This Visit's Progress     Patient Stated     Medication Monitoring (pt-stated)        Patient Goals/Self-Care Activities Over the next 90 days, patient will:  - take medications as prescribed check blood glucose at least three times daily using CGM, document, and provide at future appointments        Patient verbalizes understanding of instructions provided today and agrees to view in [place].    Plan: Telephone follow up appointment with care management team member scheduled for:  ~ 8 weeks  Catie Darnelle Maffucci, PharmD, [place], [place] Clinical Pharmacist [employer] at [employer] [phone]

## 2021-03-22 DIAGNOSIS — N183 Chronic kidney disease, stage 3 unspecified: Secondary | ICD-10-CM | POA: Diagnosis not present

## 2021-03-22 DIAGNOSIS — E11610 Type 2 diabetes mellitus with diabetic neuropathic arthropathy: Secondary | ICD-10-CM | POA: Diagnosis not present

## 2021-03-22 DIAGNOSIS — I5030 Unspecified diastolic (congestive) heart failure: Secondary | ICD-10-CM | POA: Diagnosis not present

## 2021-03-22 DIAGNOSIS — I110 Hypertensive heart disease with heart failure: Secondary | ICD-10-CM | POA: Diagnosis not present

## 2021-03-22 DIAGNOSIS — E1122 Type 2 diabetes mellitus with diabetic chronic kidney disease: Secondary | ICD-10-CM | POA: Diagnosis not present

## 2021-03-22 DIAGNOSIS — E1142 Type 2 diabetes mellitus with diabetic polyneuropathy: Secondary | ICD-10-CM | POA: Diagnosis not present

## 2021-03-22 NOTE — Progress Notes (Signed)
Cardiology Office Note:    Date:  03/23/2021   ID:  MASIYAH JORSTAD, DOB [birth year], MRN [id number]  PCP:  [name], MD  [facility] Cardiologist:  [name], MD  [facility] Electrophysiologist:  None   Referring MD: [name], MD   Chief Complaint: 1 month follow-up  History of Present Illness:    KALASIA CRAFTON is a 75 y.o. female with a hx of hx of CAD status post DES x2 to mid/ostial LAD 01/03/2020, tobacco use, hypertension, type 2 diabetes, iron deficiency anemia following gastric bypass x2, IBS, esophageal stenosis, chronic dizziness with gait instability, cervicogenic headache with cervical spine stenosis, OSA on CPAP, restless leg syndrome, adrenal mass.   Patient underwent echo 2013 for dizziness and dyspnea that showed EF 55 to 66%, normal diastolic dysfunction.  She has difficult to control hypertension on multiple agents.  In 2014 she underwent renal artery angiography which was normal and coronary angiography which was also normal with minimal luminal irregularities.   01/03/2020 she had worsening chest pain for 2 weeks which progressed to unstable angina.  She was taken for left heart cath which showed 65% ostial LAD disease, 80% mid LAD disease, LVEDP only mildly elevated, LV gram with normal EF.  She underwent PCI with DES x2 to mid/ostial LAD.  Echo showed EF 65 to 59%, grade 2 diastolic dysfunction, normal RV, no significant valvular abnormality.   She was seen back on 09/04/2020 despite intervention she still felt fairly poorly with shortness of breath.  She was started on Lasix with little improvement.  She was set up for right and left heart cath which showed nonobstructive coronary disease with 40% ostial RCA stenosis, widely patent LAD stents.  Right heart cath showed RA 17 mmHg, PCWP 22 mmHg.  She was switched to torsemide at one-point and metolazone was added.  She saw Georjean Mode 11/02/2020 for palpitations and nausea.  Heart monitor was  placed showing 11 beats NSVT longest run 11 beats, 14 SVT episodes longest was 18 beats.  She was started on metoprolol.  Patient developed AKI thought to be secondary to overdiuresis and torsemide was decreased.  She was for to nephrology and advanced heart failure clinic.   Patient's advanced heart failure clinic 01/11/2021.  She reported low blood pressures however they seem to be normotensive on her log.  She was taking torsemide 20 mg daily.  Weight trend 245 to 250 pounds.  It was felt most of her symptoms were due to her longstanding obesity and OHS, not much else from a heart failure standpoint to add on.   Seen in clinic 01/30/2021 and reported persistent chest pain.  A Myoview Lexiscan was ordered. Torsemide dose of 20mg  daily was continued.  Myocardial perfusion stress test showed low risk without ischemia or scar.  Last seen 02/15/21 reported persistent fatigue suspected to be multifactorial.  Metoprolol was discontinued given soft blood pressure and lightheadedness.  Today, the patient reports she was doing ok until she got shingles. She saw neurology and was having a lot of pain with the shingles. Lyrica was increased . She says since lyrica was increased blood pressure dropped. She reports yesterday SBPs down to 70-80s . She can tell because she has headache. She is not eating and drinking normally due to shingles medicine, she has nasuea. She is on lasix 10mg  daily. Has some dependent pedal edema. Weight up 4 lbs from last visit, but appears euvolemic. No LLE, rothopnea, pnd. Has palpitation sin the  morning but BB stopped at the last visit.    Past Medical History:  Diagnosis Date   (HFpEF) heart failure with preserved ejection fraction ([place])    a. 12/2019 Echo: EF 65-70%, Gr2 DD. No significant valvular dzs.   Abnormality of gait 03/25/2013   Adrenal mass, left ([place])    Anemia    iron deficient post  2 unit txfsn 2009, normal endo/colonoscopy by Wohl   Arthritis    CAD (coronary artery  disease) with h/o Atypical Chest Pain    a. 04/1986 Cath ([facility]): nl cors, EF 65%; b. 07/2012 Cath [facility]): Diff minor irregs; c. 07/2016 MV [facility]): "Equivocal"; d. 08/2016 Cardiac CT Ca2+ score [facility]): Ca2+ 6010; e. 05/2019 MV: No ischemia. EF 75%; f. 12/2019 PCI: LM nl, LAD 65ost (3.5x12 Resolute DES), 81m (2.75x12 Resolute DES),LCX/RCA nl; g. 08/2020 Cath: patent LAD stents, RCA 40ost, elev RH pressures, EF >65%.   Cervical spinal stenosis 1994   due to trauma to back ([employer] accident), has intermittent paralysis and parasthesias   Cervicogenic headache 03/23/2014   Depression    Dizziness    chronic dizziness   DJD (degenerative joint disease)    a. Chronic R shoulder pain pending R shoulder replacement 07/2017.   Esophageal stenosis March 2011   with transietn outlet obstruction by food, cleared by EGD    Family history of adverse reaction to anesthesia    daughter PONV   Gastric bypass status for obesity    Headache(784.0)    Hypertension    IBS (irritable bowel syndrome)    Left bundle branch block (LBBB)    a. Intermittently present - likely rate related. - patient denies   Obesity    Obstructive sleep apnea    using CPAP   Polyneuropathy in diabetes(357.2) 03/25/2013   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/09/2021   Right V1 distribution   Restless leg syndrome    Rotator cuff arthropathy, right 08/13/2017   Syncope and collapse 03/12/2014   Type II diabetes mellitus ([place])     Past Surgical History:  Procedure Laterality Date   ABDOMINAL HYSTERECTOMY     APPENDECTOMY     CARDIAC CATHETERIZATION     COLONOSCOPY     CORONARY STENT INTERVENTION N/A 01/04/2020   Procedure: CORONARY STENT INTERVENTION;  Surgeon: [name], MD;  Location: [place] [facility];  Service: Cardiovascular;  Laterality: N/A;  LAD    DIAPHRAGMATIC HERNIA REPAIR  2015   ESOPHAGEAL DILATION     multiple   ESOPHAGOGASTRODUODENOSCOPY     GALLBLADDER SURGERY  1976   GALLBLADDER SURGERY  resection   GASTRIC BYPASS      GASTRIC BYPASS  2000, 2005   Dr. [name] IMPLANT PLACEMENT  April 2013   [place] REPLACEMENT  2007   bilateral knee. [name],  [name]   LEFT HEART CATH AND CORS/GRAFTS ANGIOGRAPHY N/A 12/31/2019   Procedure: LEFT HEART CATH AND CORS/GRAFTS ANGIOGRAPHY;  Surgeon: [name], MD;  Location: [place] [facility];  Service: Cardiovascular;  Laterality: N/A;   PANNICULECTOMY  06/16/2019   PANNICULECTOMY N/A 06/16/2019   Procedure: PANNICULECTOMY;  Surgeon: [name], MD;  Location: [place];  Service: Plastics;  Laterality: N/A;  3 hours, please   REVERSE SHOULDER ARTHROPLASTY Right 08/13/2017   Procedure: REVERSE RIGHT SHOULDER ARTHROPLASTY;  Surgeon: [name], MD;  Location: [place];  Service: Orthopedics;  Laterality: Right;   RIGHT/LEFT HEART CATH AND CORONARY ANGIOGRAPHY N/A 09/06/2020   Procedure: RIGHT/LEFT HEART CATH  AND CORONARY ANGIOGRAPHY;  Surgeon: [name], MD;  Location: [place] [facility];  Service: Cardiovascular;  Laterality: N/A;   ROTATOR CUFF REPAIR     right   SPINE SURGERY  1995   [place]   TOTAL ABDOMINAL HYSTERECTOMY W/ BILATERAL SALPINGOOPHORECTOMY  5638   UMBILICAL HERNIA REPAIR  Aug 11, 2015    Current Medications: Current Meds  Medication Sig   aspirin EC 81 MG tablet Take 1 tablet (81 mg total) by mouth daily.   atorvastatin (LIPITOR) 40 MG tablet Take 1 tablet (40 mg total) by mouth daily.   clotrimazole-betamethasone (LOTRISONE) cream Apply 1 application topically 2 (two) times daily as needed.   Continuous Blood Gluc Receiver (FREESTYLE LIBRE 2 READER) DEVI Use to check sugar at least TID   Continuous Blood Gluc Sensor (FREESTYLE LIBRE 2 SENSOR) MISC 1 application by Does not apply route every 14 (fourteen) days.   cyanocobalamin (,VITAMIN B-12,) 1000 MCG/ML injection Inject 1 mL (1,000 mcg total) into the muscle every 30 (thirty) days. Inject 1 ml (1000 mcg ) IM weekly x 4,  Then monthly thereafter   dapagliflozin  propanediol (FARXIGA) 10 MG TABS tablet Take 1 tablet (10 mg total) by mouth daily.   diphenoxylate-atropine (LOMOTIL) 2.5-0.025 MG tablet Take 1 tablet by mouth 4 (four) times daily as needed for diarrhea or loose stools.   erythromycin ophthalmic ointment 1 application at bedtime.   fexofenadine (ALLEGRA) 180 MG tablet Take 180 mg by mouth daily.   insulin aspart (NOVOLOG FLEXPEN) 100 UNIT/ML FlexPen Inject 10-20 units up to three times daily before meals; If pre-meal glucose 100-150 - inject 10 units, 151-200 - inject 12 units; 201-250 - inject 14 units; 251-300 - inject 16 units, 301-350 - inject 18 units ; 350 > inject 20  units   insulin degludec (TRESIBA FLEXTOUCH) 100 UNIT/ML FlexTouch Pen Inject 15 Units into the skin daily.   Insulin Pen Needle (DROPLET PEN NEEDLES) 31G X 5 MM MISC USE ONE NEEDLE SUBCUTANEOUSLY AS DIRECTED (REMOVE AND DISCARD NEEDLE IN SHARPS CONTAINER IMMEDIATELY AFTER USE)   KLOR-CON M20 20 MEQ tablet TAKE 1 TABLET BY MOUTH EVERY DAY   Lidocaine 4 % PTCH Apply topically daily.   metolazone (ZAROXOLYN) 2.5 MG tablet TAKE 1 TABLET (2.5 MG TOTAL) BY MOUTH DAILY. 30 MINUTES BEFORE TORSEMIDE DOSE   nitroGLYCERIN (NITROSTAT) 0.4 MG SL tablet Place 1 tablet (0.4 mg total) under the tongue every 5 (five) minutes as needed for chest pain.   NONFORMULARY OR COMPOUNDED [place]  Combination Pain Cream -  Baclofen 2%, Doxepin 5%, Gabapentin 6%, Topiramate 2%, Pentoxifylline 3% Apply 1-2 grams to affected area 3-4 times daily Qty. 120 gm 3 refills   nystatin (MYCOSTATIN/NYSTOP) powder Apply 1 g topically 4 (four) times daily as needed (rash).   ondansetron (ZOFRAN-ODT) 4 MG disintegrating tablet DISSOLVE ONE TABLET ON TONGUE EVERY 8 HOURS AS NEEDED FOR NAUSEA OR VOMITING   Polyethyl Glycol-Propyl Glycol (SYSTANE) 0.4-0.3 % GEL ophthalmic gel Place 1 application into both eyes.   pregabalin (LYRICA) 50 MG capsule Take 1 capsule (50 mg total) by mouth 2 (two) times  daily.   Semaglutide,0.25 or 0.5MG /DOS, (OZEMPIC, 0.25 OR 0.5 MG/DOSE,) 2 MG/1.5ML SOPN Inject 0.5 mg into the skin once a week.   torsemide (DEMADEX) 10 MG tablet TAKE 1 TABLET BY MOUTH EVERY DAY   valACYclovir (VALTREX) 500 MG tablet Take 500 mg by mouth 2 (two) times daily.     Allergies:   Demeclocycline, Erythromycin, Flagyl [metronidazole],  Glucophage [metformin hcl], Tetracyclines & related, Diovan [valsartan], Sulfa antibiotics, and Xanax [alprazolam]   Social History   Socioeconomic History   Marital status: Married    Spouse name: [name]    Number of children: 2   Years of education: College    Highest education level: Not on file  Occupational History    Employer: retired  Tobacco Use   Smoking status: Never   Smokeless tobacco: Never  Vaping Use   Vaping Use: Never used  Substance and Sexual Activity   Alcohol use: No   Drug use: Yes    Comment: prescribed valium   Sexual activity: Not Currently    Birth control/protection: Post-menopausal, Surgical  Other Topics Concern   Not on file  Social History Narrative   Patient lives at home with husband [name].    Right Handed   Drinks caffeinated tea occasionally   Social Determinants of Health   Financial Resource Strain: Low Risk    Difficulty of Paying Living Expenses: Not hard at all  Food Insecurity: No Food Insecurity   Worried About Charity fundraiser in the Last Year: Never true   Arboriculturist in the Last Year: Never true  Transportation Needs: No Transportation Needs   Lack of Transportation (Medical): No   Lack of Transportation (Non-Medical): No  Physical Activity: Insufficiently Active   Days of Exercise per Week: 2 days   Minutes of Exercise per Session: 40 min  Stress: Stress Concern Present   Feeling of Stress : Rather much  Social Connections: Not on file     Family History: The patient's family history includes Depression in her mother; Diabetes in her daughter; Headache in her mother;  Heart disease in her daughter, father, and mother; Hypertension in her daughter, father, mother, and son; Osteoporosis in her father; Prostate cancer in her father; Stroke in her father and mother; Thyroid disease in her mother.  ROS:   Please see the history of present illness.     All other systems reviewed and are negative.  EKGs/Labs/Other Studies Reviewed:    The following studies were reviewed today:  Cardiac cath 09/06/2020 Conclusions: Non-obstructive coronary artery disease with 40% ostial RCA stenosis. Widely patient ostial/proximal and mid LAD stents, with the distal LAD tapering to a small vessel. Mildly elevated left heart and pulmonary artery pressures. Severely elevated right heart filling pressures. Low Fick cardiac output/index consistent with HFpEF.   Recommendations: Continue gentle diuresis. Encourage weight loss and exercise. Consider outpatient referral to advanced heart failure clinic for further evaluation/management of chronic HFpEF. Continue secondary prevention of coronary artery disease.   [name], MD [place]   Cardiac cath 01/04/2020   Ost LAD to Prox LAD lesion is 80% stenosed. Post intervention, there is a 0% residual stenosis. Post intervention, there is a 0% residual stenosis. A drug-eluting stent was successfully placed using a STENT RESOLUTE ONYX 3.5X12. Mid LAD lesion is 80% stenosed. A drug-eluting stent was successfully placed using a Berthold [id number].   Successful PCI and drug-eluting stent placement to mid as well as ostial LAD.   Recommendations: Dual antiplatelet therapy for at least 6 months but should consider long-term dual antiplatelet therapy given that the ostial LAD stent extends a millimeter into the left main coronary artery Aggressive treatment of risk factors. Likely discharge home tomorrow if no issues.   Echo 12/30/2019 1. Left ventricular ejection fraction, by estimation, is 65 to 70%. The   left ventricle has normal function.  The left ventricle has no regional  wall motion abnormalities. Left ventricular diastolic parameters are  consistent with Grade II diastolic  dysfunction (pseudonormalization).   2. Right ventricular systolic function is normal. The right ventricular  size is normal.   3. The mitral valve is normal in structure. No evidence of mitral valve  regurgitation. No evidence of mitral stenosis.   4. The aortic valve is normal in structure. Aortic valve regurgitation is  not visualized. No aortic stenosis is present.   Heart monitor 11/15/2020   The patient was monitored for 4 days, 22 hours. The predominant rhythm was sinus with an average rate of 77 bpm (range 53-121 bpm and sinus). There were rare PACs and PVCs. 11 episodes of nonsustained ventricular tachycardia were observed, lasting up to 8 beats with a maximum rate of 210 bpm. 14 atrial runs lasting up to 18 beats with a maximum rate of 250 bpm occurred. Some episodes labeled as supraventricular tachycardia with aberrancy could represent nonsustained ventricular tachycardia. Patient triggered events correspond to sinus rhythm and sinus rhythm with PVCs and artifact.   Predominantly sinus rhythm with rare PACs and PVCs.  Several episodes of nonsustained ventricular tachycardia and supraventricular tachycardia were observed.  EKG:  EKG is  ordered today.  The ekg ordered today demonstrates NSR, 97bpm, PVC, low voltage, no signficant change  Recent Labs: 12/14/2020: B Natriuretic Peptide 83.4 02/08/2021: ALT 20; TSH 2.209 02/09/2021: Hemoglobin 13.2; Platelets 238 02/10/2021: BUN 23; Creatinine, Ser 1.34; Magnesium 1.9; Potassium 3.9; Sodium 140  Recent Lipid Panel    Component Value Date/Time   CHOL 134 05/24/2020 1532   CHOL 155 02/27/2014 0420   TRIG 128.0 05/24/2020 1532   TRIG 121 02/27/2014 0420   HDL 60.40 05/24/2020 1532   HDL 43 02/27/2014 0420   CHOLHDL 2 05/24/2020 1532   VLDL 25.6  05/24/2020 1532   VLDL 24 02/27/2014 0420   LDLCALC 48 05/24/2020 1532   LDLCALC 88 02/27/2014 0420   LDLDIRECT 111.0 06/16/2015 0936     Physical Exam:    VS:  BP (!) 100/58 (BP Location: Left Arm, Patient Position: Sitting, Cuff Size: Normal)   Pulse 97   Ht 5\' 4"  (1.626 m)   Wt 245 lb (111.1 kg)   SpO2 96%   BMI 42.05 kg/m     Wt Readings from Last 3 Encounters:  03/23/21 245 lb (111.1 kg)  03/09/21 241 lb (109.3 kg)  02/28/21 236 lb (107 kg)     GEN:  Well nourished, well developed in no acute distress HEENT: Normal NECK: No JVD; No carotid bruits LYMPHATICS: No lymphadenopathy CARDIAC: RRR, no murmurs, rubs, gallops RESPIRATORY:  Clear to auscultation without rales, wheezing or rhonchi  ABDOMEN: Soft, non-tender, non-distended MUSCULOSKELETAL:  No edema; No deformity  SKIN: Warm and dry NEUROLOGIC:  Alert and oriented x 3 PSYCHIATRIC:  Normal affect   ASSESSMENT:    1. Essential hypertension   2. Chronic heart failure with preserved ejection fraction (HFpEF) ([place])   3. Coronary artery disease involving native coronary artery of native heart without angina pectoris   4. PSVT (paroxysmal supraventricular tachycardia) ([place])   5. Coronary artery disease involving native coronary artery of native heart with unstable angina pectoris ([place])   6. Hyperlipidemia associated with type 2 diabetes mellitus ([place])   7. Morbid obesity ([place])    PLAN:    In order of problems listed above:  Hypotension  H/o Hypertension Reports SBPs in the 70-80s. Today 100/58. Metoprolol was stopped at the  last visit. She is on Torsemide 10mg  dialy. Does not appear volume up. I will check BMET and BNP. She is on medication for shingles that she says causes nausea. May be dry and need more fluids. I will not change any medications today.  CAD without UA status post DES x2 mid/ostial LAD in 01/03/2020 No worsening anginal symptoms. Cath 08/2020 showed patent stents and 40% ostial RCA lesion.  Subsequent Myoview showed no new ischemia, overall low risk scan. Continue Aspirin, BB and statin.   HFpEF Appears euvolemic on exam. Continue Torsemide 10mg  daily. BMET and BNP as above. BB previously stopped for hypotension. Continue Farxiga.  Palpitations pSVT/PVCs Metoprolol previously stopped for hypotension  HLD LDL 48 05/2020. Continue Lipitor 40mg  daily.   Morbid obesity/deconditioning Contributing to symptoms. Recommend weight loss  Disposition: Follow up in 1 month(s) with MD   Signed, Arturo Sofranko Ninfa Meeker, PA-C  03/23/2021 8:41 PM    [facility]

## 2021-03-23 ENCOUNTER — Ambulatory Visit: Payer: Medicare Other | Admitting: Podiatry

## 2021-03-23 ENCOUNTER — Encounter: Payer: Self-pay | Admitting: Medical

## 2021-03-23 ENCOUNTER — Ambulatory Visit (INDEPENDENT_AMBULATORY_CARE_PROVIDER_SITE_OTHER): Payer: Medicare Other | Admitting: Medical

## 2021-03-23 ENCOUNTER — Other Ambulatory Visit: Payer: Self-pay

## 2021-03-23 VITALS — BP 100/58 | HR 97 | Ht 64.0 in | Wt 245.0 lb

## 2021-03-23 DIAGNOSIS — E1129 Type 2 diabetes mellitus with other diabetic kidney complication: Secondary | ICD-10-CM | POA: Diagnosis not present

## 2021-03-23 DIAGNOSIS — I471 Supraventricular tachycardia: Secondary | ICD-10-CM

## 2021-03-23 DIAGNOSIS — I5032 Chronic diastolic (congestive) heart failure: Secondary | ICD-10-CM | POA: Diagnosis not present

## 2021-03-23 DIAGNOSIS — E1122 Type 2 diabetes mellitus with diabetic chronic kidney disease: Secondary | ICD-10-CM | POA: Diagnosis not present

## 2021-03-23 DIAGNOSIS — R609 Edema, unspecified: Secondary | ICD-10-CM | POA: Diagnosis not present

## 2021-03-23 DIAGNOSIS — G4733 Obstructive sleep apnea (adult) (pediatric): Secondary | ICD-10-CM | POA: Diagnosis not present

## 2021-03-23 DIAGNOSIS — I509 Heart failure, unspecified: Secondary | ICD-10-CM | POA: Diagnosis not present

## 2021-03-23 DIAGNOSIS — I25110 Atherosclerotic heart disease of native coronary artery with unstable angina pectoris: Secondary | ICD-10-CM

## 2021-03-23 DIAGNOSIS — E7800 Pure hypercholesterolemia, unspecified: Secondary | ICD-10-CM | POA: Diagnosis not present

## 2021-03-23 DIAGNOSIS — N3281 Overactive bladder: Secondary | ICD-10-CM | POA: Diagnosis not present

## 2021-03-23 DIAGNOSIS — E1169 Type 2 diabetes mellitus with other specified complication: Secondary | ICD-10-CM

## 2021-03-23 DIAGNOSIS — E785 Hyperlipidemia, unspecified: Secondary | ICD-10-CM | POA: Diagnosis not present

## 2021-03-23 DIAGNOSIS — I2510 Atherosclerotic heart disease of native coronary artery without angina pectoris: Secondary | ICD-10-CM

## 2021-03-23 DIAGNOSIS — R3914 Feeling of incomplete bladder emptying: Secondary | ICD-10-CM | POA: Diagnosis not present

## 2021-03-23 DIAGNOSIS — R809 Proteinuria, unspecified: Secondary | ICD-10-CM | POA: Diagnosis not present

## 2021-03-23 DIAGNOSIS — I10 Essential (primary) hypertension: Secondary | ICD-10-CM

## 2021-03-23 DIAGNOSIS — D500 Iron deficiency anemia secondary to blood loss (chronic): Secondary | ICD-10-CM | POA: Diagnosis not present

## 2021-03-23 DIAGNOSIS — E611 Iron deficiency: Secondary | ICD-10-CM | POA: Diagnosis not present

## 2021-03-23 DIAGNOSIS — N1832 Chronic kidney disease, stage 3b: Secondary | ICD-10-CM | POA: Diagnosis not present

## 2021-03-23 NOTE — Patient Instructions (Signed)
Medication Instructions:   Your physician recommends that you continue on your current medications as directed. Please refer to the Current Medication list given to you today.  *If you need a refill on your cardiac medications before your next appointment, please call your pharmacy*   Lab Work:  TODAY: BMET, BNP  If you have labs (blood work) drawn today and your tests are completely normal, you will receive your results only by: [place] (if you have MyChart) OR A paper copy in the mail If you have any lab test that is abnormal or we need to change your treatment, we will call you to review the results.   Testing/Procedures:  None ordered   Follow-Up: At [facility], you and your health needs are our priority.  As part of our continuing mission to provide you with exceptional heart care, we have created designated Provider Care Teams.  These Care Teams include your primary Cardiologist (physician) and Advanced Practice Providers (APPs -  Physician Assistants and Nurse Practitioners) who all work together to provide you with the care you need, when you need it.  We recommend signing up for the patient portal called "MyChart".  Sign up information is provided on this After Visit Summary.  MyChart is used to connect with patients for Virtual Visits (Telemedicine).  Patients are able to view lab/test results, encounter notes, upcoming appointments, etc.  Non-urgent messages can be sent to your provider as well.   To learn more about what you can do with MyChart, go to [email or web address].    Your next appointment:    Follow up as scheduled with Dr. Saunders Revel

## 2021-03-24 LAB — BASIC METABOLIC PANEL
BUN/Creatinine Ratio: 17 (ref 12–28)
BUN: 23 mg/dL (ref 8–27)
CO2: 20 mmol/L (ref 20–29)
Calcium: 9.4 mg/dL (ref 8.7–10.3)
Chloride: 98 mmol/L (ref 96–106)
Creatinine, Ser: 1.34 mg/dL — ABNORMAL HIGH (ref 0.57–1.00)
Glucose: 246 mg/dL — ABNORMAL HIGH (ref 65–99)
Potassium: 4 mmol/L (ref 3.5–5.2)
Sodium: 140 mmol/L (ref 134–144)
eGFR: 41 mL/min/{1.73_m2} — ABNORMAL LOW (ref >59)

## 2021-03-27 ENCOUNTER — Other Ambulatory Visit: Payer: Self-pay

## 2021-03-27 ENCOUNTER — Ambulatory Visit (INDEPENDENT_AMBULATORY_CARE_PROVIDER_SITE_OTHER): Payer: Medicare Other | Admitting: Obstetrics and Gynecology

## 2021-03-27 ENCOUNTER — Encounter: Payer: Self-pay | Admitting: Obstetrics and Gynecology

## 2021-03-27 VITALS — BP 118/72 | Ht 64.0 in | Wt 288.8 lb

## 2021-03-27 DIAGNOSIS — Z01419 Encounter for gynecological examination (general) (routine) without abnormal findings: Secondary | ICD-10-CM | POA: Diagnosis not present

## 2021-03-27 DIAGNOSIS — Z1231 Encounter for screening mammogram for malignant neoplasm of breast: Secondary | ICD-10-CM | POA: Diagnosis not present

## 2021-03-27 DIAGNOSIS — B3731 Acute candidiasis of vulva and vagina: Secondary | ICD-10-CM

## 2021-03-27 DIAGNOSIS — B373 Candidiasis of vulva and vagina: Secondary | ICD-10-CM | POA: Diagnosis not present

## 2021-03-27 MED ORDER — FLUCONAZOLE 150 MG PO TABS
150.0000 mg | ORAL_TABLET | ORAL | 0 refills | Status: AC
Start: 2021-03-27 — End: 2021-03-31

## 2021-03-27 NOTE — Progress Notes (Signed)
Patient ID: Denise Sharp, female   DOB: 1945/07/23, 75 y.o.   MRN: [id number]  Reason for Consult: Gynecologic Exam   Referred by [name], MD  Subjective:     HPI:  Denise Sharp is a 75 y.o. female.  She was referred for vaginal irritation, however she reports that she is not experiencing vaginal irritation.  She reports that she was seen by her primary care physician who referred her to [place] today since she has not seen an obstetrician gynecologist in many years.  She has a history of a hysterectomy.  She is uncertain if she retains her fallopian tubes and ovaries.  She does not know when she last had a mammogram performed.  Gynecological History  No LMP recorded. Patient has had a hysterectomy. Menarche: unsure Menopause: unsure  History of fibroids, polyps, or ovarian cysts? : no  History of PCOS? no Hstory of Endometriosis? no History of abnormal pap smears? no Have you had any sexually transmitted infections in the past? no  Last Pap: unknown  She identifies as a female. She is not sexually active.   She denies dyspareunia. She denies postcoital bleeding.   Obstetrical History   Past Medical History:  Diagnosis Date   (HFpEF) heart failure with preserved ejection fraction ([place])    a. 12/2019 Echo: EF 65-70%, Gr2 DD. No significant valvular dzs.   Abnormality of gait 03/25/2013   Adrenal mass, left ([place])    Anemia    iron deficient post  2 unit txfsn 2009, normal endo/colonoscopy by [facility]   Arthritis    CAD (coronary artery disease) with h/o Atypical Chest Pain    a. 04/1986 Cath ([facility]): nl cors, EF 65%; b. 07/2012 Cath [facility]): Diff minor irregs; c. 07/2016 MV [facility]): "Equivocal"; d. 08/2016 Cardiac CT Ca2+ score [facility]): Ca2+ 9528; e. 05/2019 MV: No ischemia. EF 75%; f. 12/2019 PCI: LM nl, LAD 65ost (3.5x12 Resolute DES), 73m (2.75x12 Resolute DES),LCX/RCA nl; g. 08/2020 Cath: patent LAD stents, RCA 40ost, elev RH pressures, EF >65%.   Cervical spinal stenosis  1994   due to trauma to back ([employer] accident), has intermittent paralysis and parasthesias   Cervicogenic headache 03/23/2014   Depression    Dizziness    chronic dizziness   DJD (degenerative joint disease)    a. Chronic R shoulder pain pending R shoulder replacement 07/2017.   Esophageal stenosis March 2011   with transietn outlet obstruction by food, cleared by EGD    Family history of adverse reaction to anesthesia    daughter PONV   Gastric bypass status for obesity    Headache(784.0)    Hypertension    IBS (irritable bowel syndrome)    Left bundle branch block (LBBB)    a. Intermittently present - likely rate related. - patient denies   Obesity    Obstructive sleep apnea    using CPAP   Polyneuropathy in diabetes(357.2) 03/25/2013   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/09/2021   Right V1 distribution   Restless leg syndrome    Rotator cuff arthropathy, right 08/13/2017   Syncope and collapse 03/12/2014   Type II diabetes mellitus ([place])    Family History  Problem Relation Age of Onset   Heart disease Father    Hypertension Father    Prostate cancer Father    Stroke Father    Osteoporosis Father    Stroke Mother    Depression Mother    Headache Mother    Heart disease Mother    Thyroid  disease Mother    Hypertension Mother    Diabetes Daughter    Heart disease Daughter    Hypertension Daughter    Hypertension Son    Past Surgical History:  Procedure Laterality Date   ABDOMINAL HYSTERECTOMY     APPENDECTOMY     CARDIAC CATHETERIZATION     COLONOSCOPY     CORONARY STENT INTERVENTION N/A 01/04/2020   Procedure: CORONARY STENT INTERVENTION;  Surgeon: [name], MD;  Location: [place] [facility];  Service: Cardiovascular;  Laterality: N/A;  LAD    DIAPHRAGMATIC HERNIA REPAIR  2015   ESOPHAGEAL DILATION     multiple   ESOPHAGOGASTRODUODENOSCOPY     GALLBLADDER SURGERY  1976   GALLBLADDER SURGERY  resection   GASTRIC BYPASS     GASTRIC BYPASS  2000, 2005   Dr.  [name] IMPLANT PLACEMENT  April 2013   Kay REPLACEMENT  2007   bilateral knee. [name],  [name]   LEFT HEART CATH AND CORS/GRAFTS ANGIOGRAPHY N/A 12/31/2019   Procedure: LEFT HEART CATH AND CORS/GRAFTS ANGIOGRAPHY;  Surgeon: [name], MD;  Location: [place] [facility];  Service: Cardiovascular;  Laterality: N/A;   PANNICULECTOMY  06/16/2019   PANNICULECTOMY N/A 06/16/2019   Procedure: PANNICULECTOMY;  Surgeon: [name], MD;  Location: [place];  Service: Plastics;  Laterality: N/A;  3 hours, please   REVERSE SHOULDER ARTHROPLASTY Right 08/13/2017   Procedure: REVERSE RIGHT SHOULDER ARTHROPLASTY;  Surgeon: [name], MD;  Location: [place];  Service: Orthopedics;  Laterality: Right;   RIGHT/LEFT HEART CATH AND CORONARY ANGIOGRAPHY N/A 09/06/2020   Procedure: RIGHT/LEFT HEART CATH AND CORONARY ANGIOGRAPHY;  Surgeon: [name], MD;  Location: [place] [facility];  Service: Cardiovascular;  Laterality: N/A;   ROTATOR CUFF REPAIR     right   SPINE SURGERY  1995   [place]   TOTAL ABDOMINAL HYSTERECTOMY W/ BILATERAL SALPINGOOPHORECTOMY  8546   UMBILICAL HERNIA REPAIR  Aug 11, 2015    Short Social History:  Social History   Tobacco Use   Smoking status: Never   Smokeless tobacco: Never  Substance Use Topics   Alcohol use: No    Allergies  Allergen Reactions   Demeclocycline Hives   Erythromycin Nausea And Vomiting and Other (See Comments)    Severe irritable bowel   Flagyl [Metronidazole] Nausea And Vomiting and Other (See Comments)    Severe irritable bowel   Glucophage [Metformin Hcl] Nausea And Vomiting and Other (See Comments)    "Sick" "I won't take anything that has metformin in it"   Tetracyclines & Related Hives and Rash   Diovan [Valsartan] Nausea Only        Sulfa Antibiotics Rash and Other (See Comments)    As child   Xanax [Alprazolam] Other (See Comments)    Unknown reaction    Current Outpatient Medications   Medication Sig Dispense Refill   aspirin EC 81 MG tablet Take 1 tablet (81 mg total) by mouth daily. 30 tablet 2   atorvastatin (LIPITOR) 40 MG tablet Take 1 tablet (40 mg total) by mouth daily. 90 tablet 0   clotrimazole-betamethasone (LOTRISONE) cream Apply 1 application topically 2 (two) times daily as needed.     Continuous Blood Gluc Receiver (FREESTYLE LIBRE 2 READER) DEVI Use to check sugar at least TID 1 each 1   Continuous Blood Gluc Sensor (FREESTYLE LIBRE 2 SENSOR) MISC 1 application by Does not apply route every 14 (fourteen) days. 2  each 0   cyanocobalamin (,VITAMIN B-12,) 1000 MCG/ML injection Inject 1 mL (1,000 mcg total) into the muscle every 30 (thirty) days. Inject 1 ml (1000 mcg ) IM weekly x 4,  Then monthly thereafter 3 mL 1   dapagliflozin propanediol (FARXIGA) 10 MG TABS tablet Take 1 tablet (10 mg total) by mouth daily. 90 tablet 3   diphenoxylate-atropine (LOMOTIL) 2.5-0.025 MG tablet Take 1 tablet by mouth 4 (four) times daily as needed for diarrhea or loose stools. 60 tablet 5   erythromycin ophthalmic ointment 1 application at bedtime.     fexofenadine (ALLEGRA) 180 MG tablet Take 180 mg by mouth daily.     insulin aspart (NOVOLOG FLEXPEN) 100 UNIT/ML FlexPen Inject 10-20 units up to three times daily before meals; If pre-meal glucose 100-150 - inject 10 units, 151-200 - inject 12 units; 201-250 - inject 14 units; 251-300 - inject 16 units, 301-350 - inject 18 units ; 350 > inject 20  units 54 mL 3   insulin degludec (TRESIBA FLEXTOUCH) 100 UNIT/ML FlexTouch Pen Inject 15 Units into the skin daily. 15 mL 3   Insulin Pen Needle (DROPLET PEN NEEDLES) 31G X 5 MM MISC USE ONE NEEDLE SUBCUTANEOUSLY AS DIRECTED (REMOVE AND DISCARD NEEDLE IN SHARPS CONTAINER IMMEDIATELY AFTER USE) 100 each 1   KLOR-CON M20 20 MEQ tablet TAKE 1 TABLET BY MOUTH EVERY DAY 90 tablet 0   Lidocaine 4 % PTCH Apply topically daily.     metolazone (ZAROXOLYN) 2.5 MG tablet TAKE 1 TABLET (2.5 MG TOTAL)  BY MOUTH DAILY. 30 MINUTES BEFORE TORSEMIDE DOSE 30 tablet 2   nitroGLYCERIN (NITROSTAT) 0.4 MG SL tablet Place 1 tablet (0.4 mg total) under the tongue every 5 (five) minutes as needed for chest pain. 25 tablet 1   NONFORMULARY OR COMPOUNDED ITEM [facility]  Combination Pain Cream -  Baclofen 2%, Doxepin 5%, Gabapentin 6%, Topiramate 2%, Pentoxifylline 3% Apply 1-2 grams to affected area 3-4 times daily Qty. 120 gm 3 refills 1 each 3   nystatin (MYCOSTATIN/NYSTOP) powder Apply 1 g topically 4 (four) times daily as needed (rash).     ondansetron (ZOFRAN-ODT) 4 MG disintegrating tablet DISSOLVE ONE TABLET ON TONGUE EVERY 8 HOURS AS NEEDED FOR NAUSEA OR VOMITING 30 tablet 0   Polyethyl Glycol-Propyl Glycol (SYSTANE) 0.4-0.3 % GEL ophthalmic gel Place 1 application into both eyes.     pregabalin (LYRICA) 50 MG capsule Take 1 capsule (50 mg total) by mouth 2 (two) times daily. 60 capsule 3   Semaglutide,0.25 or 0.5MG /DOS, (OZEMPIC, 0.25 OR 0.5 MG/DOSE,) 2 MG/1.5ML SOPN Inject 0.5 mg into the skin once a week. 4.5 mL 3   torsemide (DEMADEX) 10 MG tablet TAKE 1 TABLET BY MOUTH EVERY DAY 90 tablet 1   valACYclovir (VALTREX) 500 MG tablet Take 500 mg by mouth 2 (two) times daily.     No current facility-administered medications for this visit.    Review of Systems  Constitutional: Positive for fatigue. Negative for chills, fever and unexpected weight change.  HENT: Negative for trouble swallowing.  Eyes: Negative for loss of vision.  Respiratory: Negative for cough, shortness of breath and wheezing.  Cardiovascular: Negative for chest pain, leg swelling, palpitations and syncope.  GI: Positive for nausea. Negative for abdominal pain, blood in stool, diarrhea and vomiting.  GU: Positive for frequency. Negative for difficulty urinating, dysuria and hematuria.  Musculoskeletal: Positive for joint pain and myalgias. Negative for back pain and leg pain.  Skin: Positive for rash.  Neurological:  Positive for dizziness. Negative for headaches, light-headedness, numbness and seizures.  Psychiatric: Negative for behavioral problem, confusion, depressed mood and sleep disturbance.       Objective:  Objective   Vitals:   03/27/21 1333  BP: 118/72  Weight: 288 lb 12.8 oz (131 kg)  Height: 5\' 4"  (1.626 m)   Body mass index is 49.57 kg/m.  Physical Exam Vitals and nursing note reviewed. Exam conducted with a chaperone present.  Constitutional:      Appearance: Normal appearance. She is well-developed.  HENT:     Head: Normocephalic and atraumatic.  Eyes:     Extraocular Movements: Extraocular movements intact.     Pupils: Pupils are equal, round, and reactive to light.  Cardiovascular:     Rate and Rhythm: Normal rate and regular rhythm.  Pulmonary:     Effort: Pulmonary effort is normal. No respiratory distress.     Breath sounds: Normal breath sounds.  Abdominal:     General: Abdomen is flat.     Palpations: Abdomen is soft.  Genitourinary:    Comments: External: Erythematous appearing vulva. No lesions noted. White discharge present, suspicious for yeast infection Speculum examination: Deferred Bimanual examination: Uterus absent.  No adnexal masses. No adnexal tenderness. Pelvis not fixed. Normal vaginal cuff.  Breast exam: breast equal without skin changes, nipple discharge, breast lump or enlarged lymph nodes Musculoskeletal:        General: No signs of injury.  Skin:    General: Skin is warm and dry.  Neurological:     Mental Status: She is alert and oriented to person, place, and time.  Psychiatric:        Behavior: Behavior normal.        Thought Content: Thought content normal.        Judgment: Judgment normal.    Assessment/Plan:     75 year old referred for pelvic and breast exam Normal pelvic and breast exam today on examination. I ordered her screening mammogram. Following of vaginal discharge suggestive of yeast vaginitis.  Nuswab sent for  confirmation.  Prescription for Diflucan sent presumptively.   More than 20 minutes were spent face to face with the patient in the room, reviewing the medical record, labs and images, and coordinating care for the patient. The plan of management was discussed in detail and counseling was provided.       Adrian Prows MD [facility] 03/27/2021 1:39 PM

## 2021-03-27 NOTE — Patient Instructions (Signed)
[facility] for Calcium and Vitamin D  Age (yr) Calcium Recommended Dietary Allowance (mg/day) Vitamin D Recommended Dietary Allowance (international units/day)  9-18 1,300 600  19-50 1,000 600  51-70 1,200 600  71 and older 1,200 800  Data from [employer]. Dietary reference intakes: calcium, vitamin D. [place], [place]: [employer]; 2011.   Budget-Friendly Healthy Eating There are many ways to save money at the grocery store and continue to eat healthy. You can be successful if you: Plan meals according to your budget. Make a grocery list and only purchase food according to your grocery list. Prepare food yourself at home. What are tips for following this plan? Reading food labels Compare food labels between brand name foods and the store brand. Often the nutritional value is the same, but the store brand is lower cost. Look for products that do not have added sugar, fat, or salt (sodium). These often cost the same but are healthier for you. Products may be labeled as: Sugar-free. Nonfat. Low-fat. Sodium-free. Low-sodium. Look for lean ground beef labeled as at least 92% lean and 8% fat. Shopping  Buy only the items on your grocery list and go only to the areas of the store that have the items on your list. Use coupons only for foods and brands you normally buy. Avoid buying items you wouldn't normally buy simply because they are on sale. Check online and in newspapers for weekly deals. Buy healthy items from the bulk bins when available, such as herbs, spices, flour, pasta, nuts, and dried fruit. Buy fruits and vegetables that are in season. Prices are usually lower on in-season produce. Look at the unit price on the price tag. Use it to compare different brands and sizes to find out which item is the best deal. Choose healthy items that are often low-cost, such as carrots, potatoes, apples, bananas, and  oranges. Dried or canned beans are a low-cost protein source. Buy in bulk and freeze extra food. Items you can buy in bulk include meats, fish, poultry, frozen fruits, and frozen vegetables. Avoid buying "ready-to-eat" foods, such as pre-cut fruits and vegetables and pre-made salads. If possible, shop around to discover where you can find the best prices. Consider other retailers such as dollar stores, larger [employer], local fruit and vegetable stands, and farmers markets. Do not shop when you are hungry. If you shop while hungry, it may be hard to stick to your list and budget. Resist impulse buying. Use your grocery list as your official plan for the week. Buy a variety of vegetables and fruits by purchasing fresh, frozen, and canned items. Look at the top and bottom shelves for deals. Foods at eye level (eye level of an adult or child) are usually more expensive. Be efficient with your time when shopping. The more time you spend at the store, the more money you are likely to spend. To save money when choosing more expensive foods like meats and dairy: Choose cheaper cuts of meat, such as bone-in chicken thighs and drumsticks instead of skinless and boneless chicken. When you are ready to prepare the chicken, you can remove the skin yourself to make it healthier. Choose lean meats like chicken or [place] instead of beef. Choose canned seafood, such as tuna, salmon, or sardines. Buy eggs as a low-cost source of protein. Buy dried beans and peas, such as lentils, split peas, or kidney beans instead of meats. Dried beans and peas are a good alternative  source of protein. Buy the larger tubs of yogurt instead of individual-sized containers. Choose water instead of sodas and other sweetened beverages. Avoid buying chips, cookies, and other "junk food." These items are usually expensive and not healthy. Cooking Make extra food and freeze the extras in meal-sized containers or in individual  portions for fast meals and snacks. Pre-cook on days when you have extra time to prepare meals in advance. You can keep these meals in the fridge or freezer and reheat for a quick meal. When you come home from the grocery store, wash, peel, and cut fruits and vegetables so they are ready to use and eat. This will help reduce food waste. Meal planning Do not eat out or get fast food. Prepare food at home. Make a grocery list and make sure to bring it with you to the store. If you have a smart phone, you could use your phone to create your shopping list. Plan meals and snacks according to a grocery list and budget you create. Use leftovers in your meal plan for the week. Look for recipes where you can cook once and make enough food for two meals. Prepare budget-friendly types of meals like stews, casseroles, and stir-fry dishes. Try some meatless meals or try "no cook" meals like salads. Make sure that half your plate is filled with fruits or vegetables. Choose from fresh, frozen, or canned fruits and vegetables. If eating canned, remember to rinse them before eating. This will remove any excess salt added for packaging. Summary Eating healthy on a budget is possible if you plan your meals according to your budget, purchase according to your budget and grocery list, and prepare food yourself. Tips for buying more food on a limited budget include buying generic brands, using coupons only for foods you normally buy, and buying healthy items from the bulk bins when available. Tips for buying cheaper food to replace expensive food include choosing cheaper, lean cuts of meat, and buying dried beans and peas. This information is not intended to replace advice given to you by your health care provider. Make sure you discuss any questions you have with your health care provider. Document Revised: 04/14/2020 Document Reviewed: 04/14/2020 Elsevier Patient Education  [place]  protect organs, store calcium, anchor muscles, and support the whole body. Keeping your bones strong is important, especially as you get older. You can take actions to help keep your bones strong and healthy. Why is keeping my bones healthy important? Keeping your bones healthy is important because your body constantly replaces bone cells. Cells get old, and new cells take their place. As we age, we lose bone cells because the body may not be able to make enough new cells to replace the old cells. The amount of bone cells and bone tissue you have is referred to as bone mass. The higher your bone mass, the stronger your bones. The aging process leads to an overall loss of bone mass in the body, which can increase the likelihood of: Joint pain and stiffness. Broken bones. A condition in which the bones become weak and brittle (osteoporosis). A large decline in bone mass occurs in older adults. In women, it occurs about the time of menopause. What actions can I take to keep my bones healthy? Good health habits are important for maintaining healthy bones. This includes eating nutritious foods and exercising regularly. To have healthy bones, you need to get enough of the right minerals and vitamins. Most nutrition  experts recommend getting these nutrients from the foods that you eat. In some cases, taking supplements may also be recommended. Doing certain types of exercise is also important for bone health. What are the nutritional recommendations for healthy bones? Eating a well-balanced diet with plenty of calcium and vitamin D will help to protect your bones. Nutritional recommendations vary from person to person. Ask your health care provider what is healthy for you. Here are some general guidelines. Get enough calcium Calcium is the most important (essential) mineral for bone health. Most people can get enough calcium from their diet, but supplements may be recommended for people who are at risk for  osteoporosis. Good sources of calcium include: Dairy products, such as low-fat or nonfat milk, cheese, and yogurt. Dark green leafy vegetables, such as bok choy and broccoli. Calcium-fortified foods, such as orange juice, cereal, bread, soy beverages, and tofu products. Nuts, such as almonds. Follow these recommended amounts for daily calcium intake: Children, age 73-3: 700 mg. Children, age 75-8: 1,000 mg. Children, age 25-13: 1,300 mg. Teens, age 735-18: 1,300 mg. Adults, age 32-50: 1,000 mg. Adults, age 53-70: Men: 1,000 mg. Women: 1,200 mg. Adults, age 69 or older: 1,200 mg. Pregnant and breastfeeding females: Teens: 1,300 mg. Adults: 1,000 mg. Get enough vitamin D Vitamin D is the most essential vitamin for bone health. It helps the body absorb calcium. Sunlight stimulates the skin to make vitamin D, so be sure to get enough sunlight. If you live in a cold climate or you do not get outside often, your health care provider may recommend that you take vitamin D supplements. Good sources of vitamin D in your diet include: Egg yolks. Saltwater fish. Milk and cereal fortified with vitamin D. Follow these recommended amounts for daily vitamin D intake: Children and teens, age 73-18: 600 international units. Adults, age 750 or younger: 400-800 international units. Adults, age 70 or older: 800-1,000 international units. Get other important nutrients Other nutrients that are important for bone health include: Phosphorus. This mineral is found in meat, poultry, dairy foods, nuts, and legumes. The recommended daily intake for adult men and adult women is 700 mg. Magnesium. This mineral is found in seeds, nuts, dark green vegetables, and legumes. The recommended daily intake for adult men is 400-420 mg. For adult women, it is 310-320 mg. Vitamin K. This vitamin is found in green leafy vegetables. The recommended daily intake is 120 mg for adult men and 90 mg for adult women. What type of physical  activity is best for building and maintaining healthy bones? Weight-bearing and strength-building activities are important for building and maintaining healthy bones. Weight-bearing activities cause muscles and bones to work against gravity. Strength-building activities increase the strength of the muscles that support bones. Weight-bearing and muscle-building activities include: Walking and hiking. Jogging and running. Dancing. Gym exercises. Lifting weights. Tennis and racquetball. Climbing stairs. Aerobics. Adults should get at least 30 minutes of moderate physical activity on most days. Children should get at least 60 minutes of moderate physical activity on most days. Ask your health care provider what type of exercise is best for you. How can I find out if my bone mass is low? Bone mass can be measured with an X-ray test called a bone mineral density (BMD) test. This test is recommended for all women who are age 21 or older. It may also be recommended for: Men who are age 75 or older. People who are at risk for osteoporosis because of: Having bones that  break easily. Having a long-term disease that weakens bones, such as kidney disease or rheumatoid arthritis. Having menopause earlier than normal. Taking medicine that weakens bones, such as steroids, thyroid hormones, or hormone treatment for breast cancer or prostate cancer. Smoking. Drinking three or more alcoholic drinks a day. If you find that you have a low bone mass, you may be able to prevent osteoporosis or further bone loss by changing your diet and lifestyle. Where can I find more information? For more information, check out the following websites: [place]: [employer] of Health: www.bones.SouthExposed.es International Osteoporosis Foundation: Administrator.iofbonehealth.org Summary The aging process leads to an overall loss of bone mass in the body, which can increase the likelihood of  broken bones and osteoporosis. Eating a well-balanced diet with plenty of calcium and vitamin D will help to protect your bones. Weight-bearing and strength-building activities are also important for building and maintaining strong bones. Bone mass can be measured with an X-ray test called a bone mineral density (BMD) test. This information is not intended to replace advice given to you by your health care provider. Make sure you discuss any questions you have with your health care provider. Document Revised: 07/29/2017 Document Reviewed: 07/29/2017 Elsevier Patient Education  2022 [employer].

## 2021-03-28 DIAGNOSIS — E11610 Type 2 diabetes mellitus with diabetic neuropathic arthropathy: Secondary | ICD-10-CM | POA: Diagnosis not present

## 2021-03-28 DIAGNOSIS — I110 Hypertensive heart disease with heart failure: Secondary | ICD-10-CM | POA: Diagnosis not present

## 2021-03-28 DIAGNOSIS — I5030 Unspecified diastolic (congestive) heart failure: Secondary | ICD-10-CM | POA: Diagnosis not present

## 2021-03-28 DIAGNOSIS — E1142 Type 2 diabetes mellitus with diabetic polyneuropathy: Secondary | ICD-10-CM | POA: Diagnosis not present

## 2021-03-28 DIAGNOSIS — N183 Chronic kidney disease, stage 3 unspecified: Secondary | ICD-10-CM | POA: Diagnosis not present

## 2021-03-28 DIAGNOSIS — E1122 Type 2 diabetes mellitus with diabetic chronic kidney disease: Secondary | ICD-10-CM | POA: Diagnosis not present

## 2021-03-29 DIAGNOSIS — E1142 Type 2 diabetes mellitus with diabetic polyneuropathy: Secondary | ICD-10-CM | POA: Diagnosis not present

## 2021-03-29 DIAGNOSIS — I110 Hypertensive heart disease with heart failure: Secondary | ICD-10-CM | POA: Diagnosis not present

## 2021-03-29 DIAGNOSIS — N183 Chronic kidney disease, stage 3 unspecified: Secondary | ICD-10-CM | POA: Diagnosis not present

## 2021-03-29 DIAGNOSIS — E1122 Type 2 diabetes mellitus with diabetic chronic kidney disease: Secondary | ICD-10-CM | POA: Diagnosis not present

## 2021-03-29 DIAGNOSIS — E11610 Type 2 diabetes mellitus with diabetic neuropathic arthropathy: Secondary | ICD-10-CM | POA: Diagnosis not present

## 2021-03-29 DIAGNOSIS — I5030 Unspecified diastolic (congestive) heart failure: Secondary | ICD-10-CM | POA: Diagnosis not present

## 2021-03-29 LAB — BRAIN NATRIURETIC PEPTIDE

## 2021-03-30 ENCOUNTER — Telehealth: Payer: Self-pay

## 2021-03-30 ENCOUNTER — Ambulatory Visit (INDEPENDENT_AMBULATORY_CARE_PROVIDER_SITE_OTHER): Payer: Medicare Other | Admitting: Podiatry

## 2021-03-30 ENCOUNTER — Encounter: Payer: Self-pay | Admitting: Podiatry

## 2021-03-30 ENCOUNTER — Other Ambulatory Visit: Payer: Self-pay

## 2021-03-30 VITALS — BP 100/78 | HR 84 | Temp 99.0°F

## 2021-03-30 DIAGNOSIS — B351 Tinea unguium: Secondary | ICD-10-CM

## 2021-03-30 DIAGNOSIS — E11610 Type 2 diabetes mellitus with diabetic neuropathic arthropathy: Secondary | ICD-10-CM | POA: Diagnosis not present

## 2021-03-30 DIAGNOSIS — E1149 Type 2 diabetes mellitus with other diabetic neurological complication: Secondary | ICD-10-CM

## 2021-03-30 DIAGNOSIS — M79676 Pain in unspecified toe(s): Secondary | ICD-10-CM

## 2021-03-30 DIAGNOSIS — D689 Coagulation defect, unspecified: Secondary | ICD-10-CM

## 2021-03-30 LAB — NUSWAB BV AND CANDIDA, NAA
Atopobium vaginae: HIGH Score — AB
Candida albicans, NAA: NEGATIVE
Candida glabrata, NAA: POSITIVE — AB

## 2021-03-30 NOTE — Telephone Encounter (Signed)
Error

## 2021-03-30 NOTE — Progress Notes (Signed)
This patient returns to my office for at risk foot care.  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  Patient is taking plavix..  This patient is unable to cut nails herself since the patient cannot reach her nails.These nails are painful walking and wearing shoes.  This patient presents for at risk foot care today.  General Appearance  Alert, conversant and in no acute stress.  Vascular  Dorsalis pedis and posterior tibial  pulses are palpable  bilaterally.  Capillary return is within normal limits  bilaterally. Temperature is within normal limits  bilaterally.  Neurologic  Senn-Weinstein monofilament wire test absent   bilaterally. Muscle power within normal limits bilaterally.  Nails Thick disfigured discolored nails with subungual debris  from hallux to fifth toes bilaterally. No evidence of bacterial infection or drainage bilaterally.  Orthopedic  No limitations of motion  feet .  No crepitus or effusions noted.  No bony pathology or digital deformities noted. Charcot foot.  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  Skin  normotropic skin with no porokeratosis noted bilaterally.  No signs of infections or ulcers noted.     Onychomycosis  Pain in right toes  Pain in left toes  Consent was obtained for treatment procedures.   Mechanical debridement of nails 1-5  bilaterally performed with a nail nipper.  Filed with dremel without incident.    Return office visit    3 months                  Told patient to return for periodic foot care and evaluation due to potential at risk complications.   Gardiner Barefoot DPM

## 2021-04-03 DIAGNOSIS — E1122 Type 2 diabetes mellitus with diabetic chronic kidney disease: Secondary | ICD-10-CM | POA: Diagnosis not present

## 2021-04-03 DIAGNOSIS — E11610 Type 2 diabetes mellitus with diabetic neuropathic arthropathy: Secondary | ICD-10-CM | POA: Diagnosis not present

## 2021-04-03 DIAGNOSIS — E1142 Type 2 diabetes mellitus with diabetic polyneuropathy: Secondary | ICD-10-CM | POA: Diagnosis not present

## 2021-04-03 DIAGNOSIS — I110 Hypertensive heart disease with heart failure: Secondary | ICD-10-CM | POA: Diagnosis not present

## 2021-04-03 DIAGNOSIS — N183 Chronic kidney disease, stage 3 unspecified: Secondary | ICD-10-CM | POA: Diagnosis not present

## 2021-04-03 DIAGNOSIS — I5030 Unspecified diastolic (congestive) heart failure: Secondary | ICD-10-CM | POA: Diagnosis not present

## 2021-04-04 ENCOUNTER — Other Ambulatory Visit (INDEPENDENT_AMBULATORY_CARE_PROVIDER_SITE_OTHER): Payer: Medicare Other

## 2021-04-04 ENCOUNTER — Other Ambulatory Visit: Payer: Self-pay

## 2021-04-04 ENCOUNTER — Telehealth: Payer: Self-pay | Admitting: *Deleted

## 2021-04-04 ENCOUNTER — Ambulatory Visit
Admission: RE | Admit: 2021-04-04 | Discharge: 2021-04-04 | Disposition: A | Discharge status: Home / Self Care | Payer: Medicare Other | Source: Ambulatory Visit | Attending: Obstetrics and Gynecology | Admitting: Obstetrics and Gynecology

## 2021-04-04 DIAGNOSIS — I5032 Chronic diastolic (congestive) heart failure: Secondary | ICD-10-CM | POA: Diagnosis not present

## 2021-04-04 DIAGNOSIS — Z1231 Encounter for screening mammogram for malignant neoplasm of breast: Secondary | ICD-10-CM | POA: Diagnosis not present

## 2021-04-04 NOTE — Telephone Encounter (Signed)
-----   Message from [place], PA-C sent at 04/04/2021  7:48 AM EDT ----- Can we please call patient and ask her to re-draw BNP. Unsure as to why this could not result. Thanks

## 2021-04-04 NOTE — Telephone Encounter (Signed)
Pt scheduled to have repeat BNP in [place] office today at 2:30 PM.  Pt states she has a mammogram at 1:40 and will come right after this appt.

## 2021-04-05 LAB — BRAIN NATRIURETIC PEPTIDE: BNP: 45 pg/mL (ref 0.0–100.0)

## 2021-04-06 ENCOUNTER — Telehealth: Payer: Self-pay | Admitting: Internal Medicine

## 2021-04-06 DIAGNOSIS — E1122 Type 2 diabetes mellitus with diabetic chronic kidney disease: Secondary | ICD-10-CM | POA: Diagnosis not present

## 2021-04-06 DIAGNOSIS — E11610 Type 2 diabetes mellitus with diabetic neuropathic arthropathy: Secondary | ICD-10-CM | POA: Diagnosis not present

## 2021-04-06 DIAGNOSIS — I5030 Unspecified diastolic (congestive) heart failure: Secondary | ICD-10-CM | POA: Diagnosis not present

## 2021-04-06 DIAGNOSIS — I110 Hypertensive heart disease with heart failure: Secondary | ICD-10-CM | POA: Diagnosis not present

## 2021-04-06 DIAGNOSIS — N183 Chronic kidney disease, stage 3 unspecified: Secondary | ICD-10-CM | POA: Diagnosis not present

## 2021-04-06 DIAGNOSIS — E1142 Type 2 diabetes mellitus with diabetic polyneuropathy: Secondary | ICD-10-CM | POA: Diagnosis not present

## 2021-04-06 NOTE — Telephone Encounter (Signed)
Patient returning call for lab results. 

## 2021-04-10 ENCOUNTER — Ambulatory Visit (INDEPENDENT_AMBULATORY_CARE_PROVIDER_SITE_OTHER): Payer: Medicare Other | Admitting: Urology

## 2021-04-10 ENCOUNTER — Other Ambulatory Visit: Payer: Self-pay

## 2021-04-10 VITALS — BP 106/70 | HR 89

## 2021-04-10 DIAGNOSIS — I25119 Atherosclerotic heart disease of native coronary artery with unspecified angina pectoris: Secondary | ICD-10-CM | POA: Diagnosis not present

## 2021-04-10 DIAGNOSIS — N3946 Mixed incontinence: Secondary | ICD-10-CM | POA: Diagnosis not present

## 2021-04-10 NOTE — Progress Notes (Signed)
04/10/2021 8:59 AM   Denise Sharp Jun 03, 1946 [id number]  Referring provider: [name], MD [place],  [place] [address]  Chief Complaint  Patient presents with   Hematuria    HPI: Recent hospital discharge.  Frequent falls.  Morbid obesity.  Chronic pain.  Peripheral neuropathy.  Extensive work-up.  Urine culture in May 2022 negative.  CT scan July 2022 normal kidneys   Saw the patient and I believe many years ago in [place].  She had failed multiple medications.  Because of obesity and other factors did not think she was a good candidate for refractory therapies.  It turns out she had InterStim 2015 in [place].  Almost completely dry can hold it for many hours.  She holds it too long she has a bit urge incontinence  I think last month she saw gross hematuria not treated.  Has some discomfort associated with it.  And she was admitted to the hospital.  Takes daily aspirin.  No blood thinner.  No smoking history    Reassess with cystoscopy for hematuria.  InterStim working beautifully and does not need replacement and with this was discussed.  My travel schedule in clinic schedule is very hectic this month and the patient may have cystoscopy by one of my partners next week to accommodate the patient.  I suspect it will be normal   Today Frequency stable.  Patient recently had cystoscopy in August 2022 by Dr. Erlene Quan.  She had a lot of erythema and mucus with inflammation of the trigone and debris throughout.  She was treated with Bactrim and it was suggested to have repeat cystoscopy to make sure her erythema had resolved.  Recommended InterStim interrogation.  She did have a positive culture with multidrug resistance and had to be given fosfomycin.  Program was interrogated with nurse visit protocol set.  When she saw [name] in follow-up she was doing well.  Symptoms were much better.  Patient is completely dry.  Clinically not infected.  She is  very pleased.  She has a lot of cardiac issues and we discussed her fluid pills   PMH: Past Medical History:  Diagnosis Date   (HFpEF) heart failure with preserved ejection fraction ([place])    a. 12/2019 Echo: EF 65-70%, Gr2 DD. No significant valvular dzs.   Abnormality of gait 03/25/2013   Adrenal mass, left ([place])    Anemia    iron deficient post  2 unit txfsn 2009, normal endo/colonoscopy by [facility]   Arthritis    CAD (coronary artery disease) with h/o Atypical Chest Pain    a. 04/1986 Cath ([facility]): nl cors, EF 65%; b. 07/2012 Cath [facility]): Diff minor irregs; c. 07/2016 [facility]): "Equivocal"; d. 08/2016 Cardiac CT Ca2+ score [facility]): Ca2+ 1914; e. 05/2019 MV: No ischemia. EF 75%; f. 12/2019 PCI: LM nl, LAD 65ost (3.5x12 Resolute DES), 71m (2.75x12 Resolute DES),LCX/RCA nl; g. 08/2020 Cath: patent LAD stents, RCA 40ost, elev RH pressures, EF >65%.   Cervical spinal stenosis 1994   due to trauma to back ([employer] accident), has intermittent paralysis and parasthesias   Cervicogenic headache 03/23/2014   Depression    Dizziness    chronic dizziness   DJD (degenerative joint disease)    a. Chronic R shoulder pain pending R shoulder replacement 07/2017.   Esophageal stenosis March 2011   with transietn outlet obstruction by food, cleared by EGD    Family history of adverse reaction to anesthesia    daughter  PONV   Gastric bypass status for obesity    Headache(784.0)    Hypertension    IBS (irritable bowel syndrome)    Left bundle branch block (LBBB)    a. Intermittently present - likely rate related. - patient denies   Obesity    Obstructive sleep apnea    using CPAP   Polyneuropathy in diabetes(357.2) 03/25/2013   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/09/2021   Right V1 distribution   Restless leg syndrome    Rotator cuff arthropathy, right 08/13/2017   Syncope and collapse 03/12/2014   Type II diabetes mellitus ([place])     Surgical History: Past Surgical History:  Procedure Laterality Date   ABDOMINAL  HYSTERECTOMY     APPENDECTOMY     CARDIAC CATHETERIZATION     COLONOSCOPY     CORONARY STENT INTERVENTION N/A 01/04/2020   Procedure: CORONARY STENT INTERVENTION;  Surgeon: [name], MD;  Location: [facility];  Service: Cardiovascular;  Laterality: N/A;  LAD    DIAPHRAGMATIC HERNIA REPAIR  2015   ESOPHAGEAL DILATION     multiple   ESOPHAGOGASTRODUODENOSCOPY     GALLBLADDER SURGERY  1976   GALLBLADDER SURGERY  resection   GASTRIC BYPASS     GASTRIC BYPASS  2000, 2005   Dr. [name] IMPLANT PLACEMENT  April 2013   [place] REPLACEMENT  2007   bilateral knee. [name],  [name]   LEFT HEART CATH AND CORS/GRAFTS ANGIOGRAPHY N/A 12/31/2019   Procedure: LEFT HEART CATH AND CORS/GRAFTS ANGIOGRAPHY;  Surgeon: [name], MD;  Location: [facility];  Service: Cardiovascular;  Laterality: N/A;   PANNICULECTOMY  06/16/2019   PANNICULECTOMY N/A 06/16/2019   Procedure: PANNICULECTOMY;  Surgeon: [name], MD;  Location: [facility];  Service: Plastics;  Laterality: N/A;  3 hours, please   REVERSE SHOULDER ARTHROPLASTY Right 08/13/2017   Procedure: REVERSE RIGHT SHOULDER ARTHROPLASTY;  Surgeon: [name], MD;  Location: [facility];  Service: Orthopedics;  Laterality: Right;   RIGHT/LEFT HEART CATH AND CORONARY ANGIOGRAPHY N/A 09/06/2020   Procedure: RIGHT/LEFT HEART CATH AND CORONARY ANGIOGRAPHY;  Surgeon: [name], MD;  Location: [facility];  Service: Cardiovascular;  Laterality: N/A;   ROTATOR CUFF REPAIR     right   SPINE SURGERY  1995   [place]   TOTAL ABDOMINAL HYSTERECTOMY W/ BILATERAL SALPINGOOPHORECTOMY  5409   UMBILICAL HERNIA REPAIR  Aug 11, 2015    Home Medications:  Allergies as of 04/10/2021       Reactions   Demeclocycline Hives   Erythromycin Nausea And Vomiting, Other (See Comments)   Severe irritable bowel   Flagyl [metronidazole] Nausea And Vomiting, Other (See Comments)   Severe irritable bowel   Glucophage  [metformin Hcl] Nausea And Vomiting, Other (See Comments)   "Sick" "I won't take anything that has metformin in it"   Tetracyclines & Related Hives, Rash   Diovan [valsartan] Nausea Only       Sulfa Antibiotics Rash, Other (See Comments)   As child   Xanax [alprazolam] Other (See Comments)   Unknown reaction        Medication List        Accurate as of April 10, 2021  8:59 AM. If you have any questions, ask your nurse or doctor.          aspirin EC 81 MG tablet Take 1 tablet (81 mg total) by mouth daily.   atorvastatin 40 MG tablet Commonly known as: LIPITOR  Take 1 tablet (40 mg total) by mouth daily.   clotrimazole-betamethasone cream Commonly known as: LOTRISONE Apply 1 application topically 2 (two) times daily as needed.   cyanocobalamin 1000 MCG/ML injection Commonly known as: (VITAMIN B-12) Inject 1 mL (1,000 mcg total) into the muscle every 30 (thirty) days. Inject 1 ml (1000 mcg ) IM weekly x 4,  Then monthly thereafter   dapagliflozin propanediol 10 MG Tabs tablet Commonly known as: FARXIGA Take 1 tablet (10 mg total) by mouth daily.   diphenoxylate-atropine 2.5-0.025 MG tablet Commonly known as: Lomotil Take 1 tablet by mouth 4 (four) times daily as needed for diarrhea or loose stools.   Droplet Pen Needles 31G X 5 MM Misc Generic drug: Insulin Pen Needle USE ONE NEEDLE SUBCUTANEOUSLY AS DIRECTED (REMOVE AND DISCARD NEEDLE IN SHARPS CONTAINER IMMEDIATELY AFTER USE)   erythromycin ophthalmic ointment 1 application at bedtime.   fexofenadine 180 MG tablet Commonly known as: ALLEGRA Take 180 mg by mouth daily.   FreeStyle Libre 2 Reader Kerrin Mo Use to check sugar at least TID   FreeStyle Libre 2 Sensor Misc 1 application by Does not apply route every 14 (fourteen) days.   Klor-Con M20 20 MEQ tablet Generic drug: potassium chloride SA TAKE 1 TABLET BY MOUTH EVERY DAY   Lidocaine 4 % Ptch Apply topically daily.   metolazone 2.5 MG  tablet Commonly known as: ZAROXOLYN TAKE 1 TABLET (2.5 MG TOTAL) BY MOUTH DAILY. 30 MINUTES BEFORE TORSEMIDE DOSE   nitroGLYCERIN 0.4 MG SL tablet Commonly known as: NITROSTAT Place 1 tablet (0.4 mg total) under the tongue every 5 (five) minutes as needed for chest pain.   NONFORMULARY OR COMPOUNDED [place]  Combination Pain Cream -  Baclofen 2%, Doxepin 5%, Gabapentin 6%, Topiramate 2%, Pentoxifylline 3% Apply 1-2 grams to affected area 3-4 times daily Qty. 120 gm 3 refills   NovoLOG FlexPen 100 UNIT/ML FlexPen Generic drug: insulin aspart Inject 10-20 units up to three times daily before meals; If pre-meal glucose 100-150 - inject 10 units, 151-200 - inject 12 units; 201-250 - inject 14 units; 251-300 - inject 16 units, 301-350 - inject 18 units ; 350 > inject 20  units   nystatin powder Commonly known as: MYCOSTATIN/NYSTOP Apply 1 g topically 4 (four) times daily as needed (rash).   ondansetron 4 MG disintegrating tablet Commonly known as: ZOFRAN-ODT DISSOLVE ONE TABLET ON TONGUE EVERY 8 HOURS AS NEEDED FOR NAUSEA OR VOMITING   Ozempic (0.25 or 0.5 MG/DOSE) 2 MG/1.5ML Sopn Generic drug: Semaglutide(0.25 or 0.5MG /DOS) Inject 0.5 mg into the skin once a week.   pregabalin 50 MG capsule Commonly known as: LYRICA Take 1 capsule (50 mg total) by mouth 2 (two) times daily.   Systane 0.4-0.3 % Gel ophthalmic gel Generic drug: Polyethyl Glycol-Propyl Glycol Place 1 application into both eyes.   torsemide 10 MG tablet Commonly known as: DEMADEX TAKE 1 TABLET BY MOUTH EVERY DAY   Tresiba FlexTouch 100 UNIT/ML FlexTouch Pen Generic drug: insulin degludec Inject 15 Units into the skin daily.   valACYclovir 500 MG tablet Commonly known as: VALTREX Take 500 mg by mouth 2 (two) times daily.        Allergies:  Allergies  Allergen Reactions   Demeclocycline Hives   Erythromycin Nausea And Vomiting and Other (See Comments)    Severe irritable bowel    Flagyl [Metronidazole] Nausea And Vomiting and Other (See Comments)    Severe irritable bowel   Glucophage [Metformin Hcl] Nausea And Vomiting and  Other (See Comments)    "Sick" "I won't take anything that has metformin in it"   Tetracyclines & Related Hives and Rash   Diovan [Valsartan] Nausea Only        Sulfa Antibiotics Rash and Other (See Comments)    As child   Xanax [Alprazolam] Other (See Comments)    Unknown reaction    Family History: Family History  Problem Relation Age of Onset   Heart disease Father    Hypertension Father    Prostate cancer Father    Stroke Father    Osteoporosis Father    Stroke Mother    Depression Mother    Headache Mother    Heart disease Mother    Thyroid disease Mother    Hypertension Mother    Diabetes Daughter    Heart disease Daughter    Hypertension Daughter    Hypertension Son     Social History:  reports that she has never smoked. She has never used smokeless tobacco. She reports current drug use. She reports that she does not drink alcohol.  ROS:                                        Physical Exam: BP 106/70   Pulse 89   Constitutional:  Alert and oriented, No acute distress. HEENT: [place] AT, moist mucus membranes.  Trachea midline, no masses.   Laboratory Data: Lab Results  Component Value Date   WBC 9.2 02/09/2021   HGB 13.2 02/09/2021   HCT 39.9 02/09/2021   MCV 95.0 02/09/2021   PLT 238 02/09/2021    Lab Results  Component Value Date   CREATININE 1.34 (H) 03/23/2021    No results found for: PSA  No results found for: TESTOSTERONE  Lab Results  Component Value Date   HGBA1C 8.9 (H) 02/08/2021    Urinalysis    Component Value Date/Time   COLORURINE YELLOW 02/17/2021 1008   APPEARANCEUR Hazy (A) 02/22/2021 1138   LABSPEC >=1.030 (A) 02/17/2021 1008   LABSPEC 1.006 06/21/2014 1623   PHURINE 5.0 02/17/2021 1008   GLUCOSEU 3+ (A) 02/22/2021 1138   GLUCOSEU 500 (A) 02/17/2021  1008   HGBUR NEGATIVE 02/17/2021 1008   BILIRUBINUR Negative 02/22/2021 1138   BILIRUBINUR Negative 06/21/2014 1623   KETONESUR NEGATIVE 02/17/2021 1008   PROTEINUR Negative 02/22/2021 1138   PROTEINUR NEGATIVE 02/08/2021 1323   UROBILINOGEN 0.2 02/17/2021 1008   NITRITE Negative 02/22/2021 1138   NITRITE NEGATIVE 02/17/2021 1008   LEUKOCYTESUR Negative 02/22/2021 1138   LEUKOCYTESUR SMALL (A) 02/17/2021 1008   LEUKOCYTESUR Negative 06/21/2014 1623    Pertinent Imaging:   Assessment & Plan: I will see her in 1 year.  If she starts leaking we first need to rule out bladder infection.  She does not need another cystoscopy since the findings were in keeping with a positive culture  There are no diagnoses linked to this encounter.  No follow-ups on file.  Reece Packer, MD  [facility] [address], [place], [place] [address] [phone]

## 2021-04-13 ENCOUNTER — Other Ambulatory Visit: Payer: Self-pay | Admitting: Internal Medicine

## 2021-04-13 MED ORDER — AMOXICILLIN 500 MG PO CAPS: ORAL_CAPSULE | ORAL | 1 refills | Status: DC

## 2021-04-13 NOTE — Progress Notes (Signed)
Amoxiicillin sent to pharmacy for  pre procedure

## 2021-04-25 ENCOUNTER — Other Ambulatory Visit: Payer: Self-pay | Admitting: *Deleted

## 2021-04-25 ENCOUNTER — Other Ambulatory Visit: Payer: Self-pay

## 2021-04-25 DIAGNOSIS — D509 Iron deficiency anemia, unspecified: Secondary | ICD-10-CM

## 2021-04-25 DIAGNOSIS — E611 Iron deficiency: Secondary | ICD-10-CM

## 2021-04-26 ENCOUNTER — Other Ambulatory Visit: Payer: Self-pay

## 2021-04-26 ENCOUNTER — Inpatient Hospital Stay: Payer: Medicare Other | Attending: Internal Medicine

## 2021-04-26 ENCOUNTER — Inpatient Hospital Stay: Payer: Medicare Other

## 2021-04-26 ENCOUNTER — Encounter: Payer: Self-pay | Admitting: Internal Medicine

## 2021-04-26 ENCOUNTER — Inpatient Hospital Stay (HOSPITAL_BASED_OUTPATIENT_CLINIC_OR_DEPARTMENT_OTHER): Payer: Medicare Other | Admitting: Internal Medicine

## 2021-04-26 DIAGNOSIS — D509 Iron deficiency anemia, unspecified: Secondary | ICD-10-CM | POA: Insufficient documentation

## 2021-04-26 DIAGNOSIS — M129 Arthropathy, unspecified: Secondary | ICD-10-CM | POA: Insufficient documentation

## 2021-04-26 DIAGNOSIS — Z79899 Other long term (current) drug therapy: Secondary | ICD-10-CM | POA: Insufficient documentation

## 2021-04-26 DIAGNOSIS — E1122 Type 2 diabetes mellitus with diabetic chronic kidney disease: Secondary | ICD-10-CM | POA: Diagnosis not present

## 2021-04-26 DIAGNOSIS — E669 Obesity, unspecified: Secondary | ICD-10-CM | POA: Diagnosis not present

## 2021-04-26 DIAGNOSIS — G4733 Obstructive sleep apnea (adult) (pediatric): Secondary | ICD-10-CM | POA: Insufficient documentation

## 2021-04-26 DIAGNOSIS — E1142 Type 2 diabetes mellitus with diabetic polyneuropathy: Secondary | ICD-10-CM | POA: Diagnosis not present

## 2021-04-26 DIAGNOSIS — K589 Irritable bowel syndrome without diarrhea: Secondary | ICD-10-CM | POA: Diagnosis not present

## 2021-04-26 DIAGNOSIS — Z794 Long term (current) use of insulin: Secondary | ICD-10-CM | POA: Insufficient documentation

## 2021-04-26 DIAGNOSIS — E611 Iron deficiency: Secondary | ICD-10-CM | POA: Diagnosis not present

## 2021-04-26 DIAGNOSIS — E538 Deficiency of other specified B group vitamins: Secondary | ICD-10-CM | POA: Diagnosis not present

## 2021-04-26 DIAGNOSIS — R1310 Dysphagia, unspecified: Secondary | ICD-10-CM | POA: Insufficient documentation

## 2021-04-26 DIAGNOSIS — I130 Hypertensive heart and chronic kidney disease with heart failure and stage 1 through stage 4 chronic kidney disease, or unspecified chronic kidney disease: Secondary | ICD-10-CM | POA: Diagnosis not present

## 2021-04-26 DIAGNOSIS — Z9884 Bariatric surgery status: Secondary | ICD-10-CM | POA: Insufficient documentation

## 2021-04-26 DIAGNOSIS — R5383 Other fatigue: Secondary | ICD-10-CM | POA: Insufficient documentation

## 2021-04-26 DIAGNOSIS — I2510 Atherosclerotic heart disease of native coronary artery without angina pectoris: Secondary | ICD-10-CM | POA: Insufficient documentation

## 2021-04-26 DIAGNOSIS — Z7982 Long term (current) use of aspirin: Secondary | ICD-10-CM | POA: Insufficient documentation

## 2021-04-26 LAB — CBC WITH DIFFERENTIAL/PLATELET
Abs Immature Granulocytes: 0.12 10*3/uL — ABNORMAL HIGH (ref 0.00–0.07)
Basophils Absolute: 0.1 10*3/uL (ref 0.0–0.1)
Basophils Relative: 1 %
Eosinophils Absolute: 0.1 10*3/uL (ref 0.0–0.5)
Eosinophils Relative: 1 %
HCT: 42 % (ref 36.0–46.0)
Hemoglobin: 13.7 g/dL (ref 12.0–15.0)
Immature Granulocytes: 1 %
Lymphocytes Relative: 28 %
Lymphs Abs: 3.8 10*3/uL (ref 0.7–4.0)
MCH: 31.6 pg (ref 26.0–34.0)
MCHC: 32.6 g/dL (ref 30.0–36.0)
MCV: 96.8 fL (ref 80.0–100.0)
Monocytes Absolute: 0.8 10*3/uL (ref 0.1–1.0)
Monocytes Relative: 6 %
Neutro Abs: 8.9 10*3/uL — ABNORMAL HIGH (ref 1.7–7.7)
Neutrophils Relative %: 63 %
Platelets: 308 10*3/uL (ref 150–400)
RBC: 4.34 MIL/uL (ref 3.87–5.11)
RDW: 15.1 % (ref 11.5–15.5)
WBC: 13 10*3/uL — ABNORMAL HIGH (ref 4.0–10.5)
nRBC: 0 % (ref 0.0–0.2)

## 2021-04-26 LAB — BASIC METABOLIC PANEL
Anion gap: 19 — ABNORMAL HIGH (ref 5–15)
BUN: 44 mg/dL — ABNORMAL HIGH (ref 8–23)
CO2: 27 mmol/L (ref 22–32)
Calcium: 8.1 mg/dL — ABNORMAL LOW (ref 8.9–10.3)
Chloride: 89 mmol/L — ABNORMAL LOW (ref 98–111)
Creatinine, Ser: 1.75 mg/dL — ABNORMAL HIGH (ref 0.44–1.00)
GFR, Estimated: 30 mL/min — ABNORMAL LOW (ref >60)
Glucose, Bld: 374 mg/dL — ABNORMAL HIGH (ref 70–99)
Potassium: 2.9 mmol/L — ABNORMAL LOW (ref 3.5–5.1)
Sodium: 135 mmol/L (ref 135–145)

## 2021-04-26 MED ORDER — POTASSIUM CHLORIDE 20 MEQ/15ML (10%) PO SOLN: 20.0000 meq | Freq: Two times a day (BID) | ORAL | 0 refills | Status: DC

## 2021-04-26 NOTE — Progress Notes (Signed)
[place] NOTE  Patient Care Team: [name], MD as PCP - General (Internal Medicine) End, Harrell Gave, MD as PCP - Cardiology (Cardiology) Chiropractic, Freddi Che as Referring Physician De Hollingshead, RPH-CPP (Pharmacist)  CHIEF COMPLAINTS/PURPOSE OF CONSULTATION:    HEMATOLOGY HISTORY  # IRON DEFICIENT ANEMIA [place] TO [place]; 2018 IV iron infusion [Dr. Finnegan] EGD/colonoscopy- ?2021 [Dr.Wohl]; NOV 2021-hemoglobin 10.  Iron saturation 5% ferritin 12.  # GASTRIC BYPASS x2 [2005]; CAD/CHF [s/p stent; Dr.End]; CKD stage III [Dr.Korrapati]; poorly controlled diabetes- [Dr.Tullo]  HISTORY OF PRESENTING ILLNESS: Alone.  Walking with a rolling walker. Denise Sharp 75 y.o.  female iron deficiency secondary gastric bypass is here for follow-up.  Patient feels poorly.  Complains of fatigue.  Complains of cramping.  Nausea no vomiting.  Patient states her diuretic was increased to 20 mg approximately 1 month ago.  She states that she had not been taking her potassium pills for the last couple of days because of difficulty swallowing.  She is awaiting reevaluation with Dr. Allen Norris re: endoscopy given the difficulty swallowing.  Patient had a prior esophagram in August 2021 that showed-stricture.  Endoscopy not done at the time because of anticoagulation.   Review of Systems  Constitutional:  Positive for malaise/fatigue. Negative for chills, diaphoresis, fever and weight loss.  HENT:  Negative for nosebleeds and sore throat.   Eyes:  Negative for double vision.  Respiratory:  Negative for cough, hemoptysis, sputum production, shortness of breath and wheezing.   Cardiovascular:  Negative for chest pain, palpitations, orthopnea and leg swelling.  Gastrointestinal:  Positive for nausea. Negative for abdominal pain, blood in stool, constipation, heartburn, melena and vomiting.  Genitourinary:  Negative for dysuria, frequency and urgency.   Musculoskeletal:  Positive for back pain, joint pain and myalgias.  Skin: Negative.  Negative for itching and rash.  Neurological:  Negative for dizziness, tingling, focal weakness, weakness and headaches.  Endo/Heme/Allergies:  Does not bruise/bleed easily.  Psychiatric/Behavioral:  Negative for depression. The patient is not nervous/anxious and does not have insomnia.    MEDICAL HISTORY:  Past Medical History:  Diagnosis Date  . (HFpEF) heart failure with preserved ejection fraction ([place])    a. 12/2019 Echo: EF 65-70%, Gr2 DD. No significant valvular dzs.  . Abnormality of gait 03/25/2013  . Adrenal mass, left ([place])   . Anemia    iron deficient post  2 unit txfsn 2009, normal endo/colonoscopy by [facility]  . Arthritis   . CAD (coronary artery disease) with h/o Atypical Chest Pain    a. 04/1986 Cath ([facility]): nl cors, EF 65%; b. 07/2012 Cath [facility]): Diff minor irregs; c. 07/2016 MV [facility]): "Equivocal"; d. 08/2016 Cardiac CT Ca2+ score [facility]): Ca2+ 0240; e. 05/2019 MV: No ischemia. EF 75%; f. 12/2019 PCI: LM nl, LAD 65ost (3.5x12 Resolute DES), 28m (2.75x12 Resolute DES),LCX/RCA nl; g. 08/2020 Cath: patent LAD stents, RCA 40ost, elev RH pressures, EF >65%.  . Cervical spinal stenosis 1994   due to trauma to back ([employer] accident), has intermittent paralysis and parasthesias  . Cervicogenic headache 03/23/2014  . Depression   . Dizziness    chronic dizziness  . DJD (degenerative joint disease)    a. Chronic R shoulder pain pending R shoulder replacement 07/2017.  [name] Esophageal stenosis March 2011   with transietn outlet obstruction by food, cleared by EGD   . Family history of adverse reaction to anesthesia    daughter PONV  . Gastric bypass status for obesity   .  Headache(784.0)   . Hypertension   . IBS (irritable bowel syndrome)   . Left bundle branch block (LBBB)    a. Intermittently present - likely rate related. - patient denies  . Obesity   . Obstructive sleep apnea    using CPAP  .  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/09/2021   Right V1 distribution  . Restless leg syndrome   . Rotator cuff arthropathy, right 08/13/2017  . Syncope and collapse 03/12/2014  . Type II diabetes mellitus ([place])     SURGICAL HISTORY: Past Surgical History:  Procedure Laterality Date  . ABDOMINAL HYSTERECTOMY    . APPENDECTOMY    . CARDIAC CATHETERIZATION    . COLONOSCOPY    . CORONARY STENT INTERVENTION N/A 01/04/2020   Procedure: CORONARY STENT INTERVENTION;  Surgeon: [name], MD;  Location: [place] [facility];  Service: Cardiovascular;  Laterality: N/A;  LAD   . DIAPHRAGMATIC HERNIA REPAIR  2015  . ESOPHAGEAL DILATION     multiple  . ESOPHAGOGASTRODUODENOSCOPY    . [place]  . GALLBLADDER SURGERY  resection  . GASTRIC BYPASS    . GASTRIC BYPASS  2000, 2005   Dr. Debroah Loop  . [facility] IMPLANT PLACEMENT  April 2013   [place]  . JOINT REPLACEMENT  2007   bilateral knee. [name],  [name]  . LEFT HEART CATH AND CORS/GRAFTS ANGIOGRAPHY N/A 12/31/2019   Procedure: LEFT HEART CATH AND CORS/GRAFTS ANGIOGRAPHY;  Surgeon: [name], MD;  Location: [place] [facility];  Service: Cardiovascular;  Laterality: N/A;  . PANNICULECTOMY  06/16/2019  . PANNICULECTOMY N/A 06/16/2019   Procedure: PANNICULECTOMY;  Surgeon: [name], MD;  Location: [place];  Service: Plastics;  Laterality: N/A;  3 hours, please  . REVERSE SHOULDER ARTHROPLASTY Right 08/13/2017   Procedure: REVERSE RIGHT SHOULDER ARTHROPLASTY;  Surgeon: [name], MD;  Location: [place];  Service: Orthopedics;  Laterality: Right;  . RIGHT/LEFT HEART CATH AND CORONARY ANGIOGRAPHY N/A 09/06/2020   Procedure: RIGHT/LEFT HEART CATH AND CORONARY ANGIOGRAPHY;  Surgeon: [name], MD;  Location: [place] [facility];  Service: Cardiovascular;  Laterality: N/A;  . ROTATOR CUFF REPAIR     right  . [place]  . TOTAL ABDOMINAL HYSTERECTOMY W/  BILATERAL SALPINGOOPHORECTOMY  1974  . UMBILICAL HERNIA REPAIR  Aug 11, 2015    SOCIAL HISTORY: Social History   Socioeconomic History  . Marital status: Married    Spouse name: [name]   . Number of children: 2  . Years of education: College   . Highest education level: Not on file  Occupational History    Employer: retired  Tobacco Use  . Smoking status: Never  . Smokeless tobacco: Never  Vaping Use  . Vaping Use: Never used  Substance and Sexual Activity  . Alcohol use: No  . Drug use: Yes    Comment: prescribed valium  . Sexual activity: Not Currently    Birth control/protection: Post-menopausal, Surgical  Other Topics Concern  . Not on file  Social History Narrative   Patient lives at home with husband [name].    Right Handed   Drinks caffeinated tea occasionally   Social Determinants of Health   Financial Resource Strain: Low Risk   . Difficulty of Paying Living Expenses: Not hard at all  Food Insecurity: No Food Insecurity  . Worried About Charity fundraiser in the Last Year: Never true  . Ran Out of Food  in the Last Year: Never true  Transportation Needs: No Transportation Needs  . Lack of Transportation (Medical): No  . Lack of Transportation (Non-Medical): No  Physical Activity: Insufficiently Active  . Days of Exercise per Week: 2 days  . Minutes of Exercise per Session: 40 min  Stress: Stress Concern Present  . Feeling of Stress : Rather much  Social Connections: Not on file  Intimate Partner Violence: Not At Risk  . Fear of Current or Ex-Partner: No  . Emotionally Abused: No  . Physically Abused: No  . Sexually Abused: No    FAMILY HISTORY: Family History  Problem Relation Age of Onset  . Heart disease Father   . Hypertension Father   . Prostate cancer Father   . Stroke Father   . Osteoporosis Father   . Stroke Mother   . Depression Mother   . Headache Mother   . Heart disease Mother   . Thyroid disease Mother   . Hypertension Mother   .  Diabetes Daughter   . Heart disease Daughter   . Hypertension Daughter   . Hypertension Son     ALLERGIES:  is allergic to demeclocycline, erythromycin, flagyl [metronidazole], glucophage [metformin hcl], tetracyclines & related, diovan [valsartan], sulfa antibiotics, and xanax [alprazolam].  MEDICATIONS:  Current Outpatient Medications  Medication Sig Dispense Refill  . aspirin EC 81 MG tablet Take 1 tablet (81 mg total) by mouth daily. 30 tablet 2  . atorvastatin (LIPITOR) 40 MG tablet Take 1 tablet (40 mg total) by mouth daily. 90 tablet 0  . clotrimazole-betamethasone (LOTRISONE) cream Apply 1 application topically 2 (two) times daily as needed.    . Continuous Blood Gluc Receiver (FREESTYLE LIBRE 2 READER) DEVI Use to check sugar at least TID 1 each 1  . Continuous Blood Gluc Sensor (FREESTYLE LIBRE 2 SENSOR) MISC 1 application by Does not apply route every 14 (fourteen) days. 2 each 0  . cyanocobalamin (,VITAMIN B-12,) 1000 MCG/ML injection Inject 1 mL (1,000 mcg total) into the muscle every 30 (thirty) days. Inject 1 ml (1000 mcg ) IM weekly x 4,  Then monthly thereafter 3 mL 1  . dapagliflozin propanediol (FARXIGA) 10 MG TABS tablet Take 1 tablet (10 mg total) by mouth daily. 90 tablet 3  . diphenoxylate-atropine (LOMOTIL) 2.5-0.025 MG tablet Take 1 tablet by mouth 4 (four) times daily as needed for diarrhea or loose stools. 60 tablet 5  . erythromycin ophthalmic ointment 1 application at bedtime.    . fexofenadine (ALLEGRA) 180 MG tablet Take 180 mg by mouth daily.    . insulin aspart (NOVOLOG FLEXPEN) 100 UNIT/ML FlexPen Inject 10-20 units up to three times daily before meals; If pre-meal glucose 100-150 - inject 10 units, 151-200 - inject 12 units; 201-250 - inject 14 units; 251-300 - inject 16 units, 301-350 - inject 18 units ; 350 > inject 20  units 54 mL 3  . insulin degludec (TRESIBA FLEXTOUCH) 100 UNIT/ML FlexTouch Pen Inject 15 Units into the skin daily. 15 mL 3  . Insulin Pen  Needle (DROPLET PEN NEEDLES) 31G X 5 MM MISC USE ONE NEEDLE SUBCUTANEOUSLY AS DIRECTED (REMOVE AND DISCARD NEEDLE IN SHARPS CONTAINER IMMEDIATELY AFTER USE) 100 each 1  . KLOR-CON M20 20 MEQ tablet TAKE 1 TABLET BY MOUTH EVERY DAY 90 tablet 0  . Lidocaine 4 % PTCH Apply topically daily.    . metolazone (ZAROXOLYN) 2.5 MG tablet TAKE 1 TABLET (2.5 MG TOTAL) BY MOUTH DAILY. 30 MINUTES BEFORE TORSEMIDE DOSE 30  tablet 2  . nitroGLYCERIN (NITROSTAT) 0.4 MG SL tablet Place 1 tablet (0.4 mg total) under the tongue every 5 (five) minutes as needed for chest pain. 25 tablet 1  . NONFORMULARY OR COMPOUNDED [place]  Combination Pain Cream -  Baclofen 2%, Doxepin 5%, Gabapentin 6%, Topiramate 2%, Pentoxifylline 3% Apply 1-2 grams to affected area 3-4 times daily Qty. 120 gm 3 refills 1 each 3  . nystatin (MYCOSTATIN/NYSTOP) powder Apply 1 g topically 4 (four) times daily as needed (rash).    . ondansetron (ZOFRAN-ODT) 4 MG disintegrating tablet DISSOLVE ONE TABLET ON TONGUE EVERY 8 HOURS AS NEEDED FOR NAUSEA OR VOMITING 30 tablet 0  . Polyethyl Glycol-Propyl Glycol (SYSTANE) 0.4-0.3 % GEL ophthalmic gel Place 1 application into both eyes.    . potassium chloride 20 MEQ/15ML (10%) SOLN Take 15 mLs (20 mEq total) by mouth 2 (two) times daily. 473 mL 0  . pregabalin (LYRICA) 50 MG capsule Take 1 capsule (50 mg total) by mouth 2 (two) times daily. 60 capsule 3  . Semaglutide,0.25 or 0.5MG /DOS, (OZEMPIC, 0.25 OR 0.5 MG/DOSE,) 2 MG/1.5ML SOPN Inject 0.5 mg into the skin once a week. 4.5 mL 3  . torsemide (DEMADEX) 10 MG tablet TAKE 1 TABLET BY MOUTH EVERY DAY 90 tablet 1  . valACYclovir (VALTREX) 500 MG tablet Take 500 mg by mouth 2 (two) times daily.    [name] amoxicillin (AMOXIL) 500 MG capsule 4 capsules 1-2 hours pre procedure (Patient not taking: Reported on 04/26/2021) 4 capsule 1   No current facility-administered medications for this visit.      PHYSICAL EXAMINATION:   Vitals:    04/26/21 1315  BP: (!) 110/52  Pulse: 81  Temp: 97.8 F (36.6 C)    Filed Weights   04/26/21 1315  Weight: 240 lb 9.6 oz (109.1 kg)     Physical Exam Constitutional:      Comments: Obese.  HENT:     Head: Normocephalic and atraumatic.     Mouth/Throat:     Pharynx: No oropharyngeal exudate.  Eyes:     Pupils: Pupils are equal, round, and reactive to light.  Cardiovascular:     Rate and Rhythm: Normal rate and regular rhythm.     Pulses: Normal pulses.     Heart sounds: Normal heart sounds.  Pulmonary:     Effort: Pulmonary effort is normal. No respiratory distress.     Breath sounds: Normal breath sounds. No wheezing.  Abdominal:     General: Bowel sounds are normal. There is no distension.     Palpations: Abdomen is soft. There is no mass.     Tenderness: There is no abdominal tenderness. There is no guarding or rebound.  Musculoskeletal:        General: No tenderness. Normal range of motion.     Cervical back: Normal range of motion and neck supple.  Skin:    General: Skin is warm.  Neurological:     Mental Status: She is alert and oriented to person, place, and time.  Psychiatric:        Mood and Affect: Affect normal.    LABORATORY DATA:  I have reviewed the data as listed Lab Results  Component Value Date   WBC 13.0 (H) 04/26/2021   HGB 13.7 04/26/2021   HCT 42.0 04/26/2021   MCV 96.8 04/26/2021   PLT 308 04/26/2021   Recent Labs    06/07/20 1546 07/17/20 1150 08/31/20 1307 01/27/21 1137 02/03/21 1229 02/08/21 1054 02/09/21  6578 02/09/21 1424 02/10/21 0555 03/23/21 1141 04/26/21 1237  NA  --  140   < >  --    < > 139   < > 142 140 140 135  K  --  3.7   < >  --    < > 4.4   < > 3.6 3.9 4.0 2.9*  CL  --  106   < >  --    < > 95*   < > 102 103 98 89*  CO2  --  20*   < >  --    < > 29   < > 31 29 20 27   GLUCOSE  --  117*   < >  --    < > 191*   < > 176* 119* 246* 374*  BUN  --  14   < >  --    < > 27*   < > 27* 23 23 44*  CREATININE  --  1.03*    < >  --    < > 1.45*   < > 1.47* 1.34* 1.34* 1.75*  CALCIUM  --  9.1   < >  --    < > 8.8*   < > 8.5* 8.5* 9.4 8.1*  GFRNONAA  --  57*   < >  --    < > 38*   < > 37* 41*  --  30*  PROT 6.9 7.6  --  7.5  --  7.3  --   --   --   --   --   ALBUMIN 3.6 3.5  --  3.6  --  3.3*  --   --   --   --   --   AST 12 18  --  27  --  42*  --   --   --   --   --   ALT 9 12  --  19  --  20  --   --   --   --   --   ALKPHOS 153*  134* 129*  --  111  --  102  --   --   --   --   --   BILITOT 0.3 0.5  --  0.7  --  1.9*  --   --   --   --   --   BILIDIR 0.1  --   --  <0.1  --   --   --   --   --   --   --   IBILI  --   --   --  NOT CALCULATED  --   --   --   --   --   --   --    < > = values in this interval not displayed.     MM 3D SCREEN BREAST BILATERAL  Result Date: 04/10/2021 CLINICAL DATA:  Screening. EXAM: DIGITAL SCREENING BILATERAL MAMMOGRAM WITH TOMOSYNTHESIS AND CAD TECHNIQUE: Bilateral screening digital craniocaudal and mediolateral oblique mammograms were obtained. Bilateral screening digital breast tomosynthesis was performed. The images were evaluated with computer-aided detection. COMPARISON:  Previous exam(s). ACR Breast Density Category b: There are scattered areas of fibroglandular density. FINDINGS: There are no findings suspicious for malignancy. IMPRESSION: No mammographic evidence of malignancy. A result letter of this screening mammogram will be mailed directly to the patient. RECOMMENDATION: Screening mammogram in one year. (Code:SM-B-01Y) BI-RADS CATEGORY  1: Negative. Electronically Signed   By: Thomas Hoff  Dimitrova M.D.   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   #  S/p Feraheme infusion hemoglobin today is 13. HOLD Feraheme today-see below  # B12 deficiency secondary gastric bypass at home iron injections.  #Renal/electrolytes :  hypokalemia 2.9 potassium; creatinine 1.7 [baseline 1.3]-on diuretic Demadex 20 mg a day [Dr.Korrapati]; inability to swallow- Kdur.  I called in liquid potassium 15 mEq twice daily.  Discussed with Dr.End; cardiology; Dr.Korrapti/nephrology;Dr.Tullo, PCP.  Patient has a follow-up with cardiology next week-repeat labs.  #Poorly controlled diabetes-blood sugars-374; on insulin/recommend follow-up with PCP.   # Fatigue: ? Cardiac/CHF [Dr.End]- on Lasix/ Kdur; see above.   # Esophagus: dysphagia- on esophagogram ? Stricture [aug 2021]- Awaitingappt [? EGD] with Dr.Wohl [appt on oct, 29th]  # DISPOSITION: # HOLD ferrahem today # follow up in 4 months; NP- ;labs- cbc/bmp;; possible Ferrahem- Dr.B  All questions were answered. The patient knows to call the clinic with any problems, questions or concerns.    Cammie Sickle, MD 04/26/2021 2:59 PM

## 2021-04-26 NOTE — Assessment & Plan Note (Addendum)
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   #  S/p Feraheme infusion hemoglobin today is 13. HOLD Feraheme today-see below  # B12 deficiency secondary gastric bypass at home iron injections.  #Renal/electrolytes : hypokalemia 2.9 potassium; creatinine 1.7 [baseline 1.3]-on diuretic Demadex 20 mg a day [Dr.Korrapati]; inability to swallow- Kdur.  I called in liquid potassium 15 mEq twice daily.  Discussed with Dr.End; cardiology; Dr.Korrapti/nephrology;Dr.Tullo, PCP.  Patient has a follow-up with cardiology next week-repeat labs.  #Poorly controlled diabetes-blood sugars-374; on insulin/recommend follow-up with PCP.   # Fatigue: ? Cardiac/CHF [Dr.End]- on Lasix/ Kdur; see above.   # Esophagus: dysphagia- on esophagogram ? Stricture [aug 2021]- Awaitingappt [? EGD] with Dr.Wohl [appt on oct, 29th]  # DISPOSITION: # HOLD ferrahem today # follow up in 4 months; NP- ;labs- cbc/bmp;; possible Ferrahem- Dr.B

## 2021-05-02 ENCOUNTER — Ambulatory Visit: Payer: Medicare Other | Admitting: Cardiovascular Disease

## 2021-05-03 ENCOUNTER — Other Ambulatory Visit: Payer: Self-pay

## 2021-05-03 ENCOUNTER — Other Ambulatory Visit
Admission: RE | Admit: 2021-05-03 | Discharge: 2021-05-03 | Disposition: A | Discharge status: Home / Self Care | Payer: Medicare Other | Attending: Internal Medicine | Admitting: Internal Medicine

## 2021-05-03 ENCOUNTER — Ambulatory Visit (INDEPENDENT_AMBULATORY_CARE_PROVIDER_SITE_OTHER): Payer: Medicare Other | Admitting: Internal Medicine

## 2021-05-03 ENCOUNTER — Encounter: Payer: Self-pay | Admitting: Internal Medicine

## 2021-05-03 VITALS — BP 110/66 | HR 91 | Ht 64.5 in | Wt 244.0 lb

## 2021-05-03 DIAGNOSIS — E1169 Type 2 diabetes mellitus with other specified complication: Secondary | ICD-10-CM | POA: Diagnosis not present

## 2021-05-03 DIAGNOSIS — I5032 Chronic diastolic (congestive) heart failure: Secondary | ICD-10-CM

## 2021-05-03 DIAGNOSIS — E785 Hyperlipidemia, unspecified: Secondary | ICD-10-CM | POA: Diagnosis not present

## 2021-05-03 DIAGNOSIS — I2510 Atherosclerotic heart disease of native coronary artery without angina pectoris: Secondary | ICD-10-CM

## 2021-05-03 DIAGNOSIS — I5033 Acute on chronic diastolic (congestive) heart failure: Secondary | ICD-10-CM

## 2021-05-03 DIAGNOSIS — I25119 Atherosclerotic heart disease of native coronary artery with unspecified angina pectoris: Secondary | ICD-10-CM | POA: Diagnosis not present

## 2021-05-03 LAB — BASIC METABOLIC PANEL WITH GFR
Anion gap: 11 (ref 5–15)
BUN: 20 mg/dL (ref 8–23)
CO2: 25 mmol/L (ref 22–32)
Calcium: 8.4 mg/dL — ABNORMAL LOW (ref 8.9–10.3)
Chloride: 101 mmol/L (ref 98–111)
Creatinine, Ser: 1.33 mg/dL — ABNORMAL HIGH (ref 0.44–1.00)
GFR, Estimated: 42 mL/min — ABNORMAL LOW
Glucose, Bld: 184 mg/dL — ABNORMAL HIGH (ref 70–99)
Potassium: 3.2 mmol/L — ABNORMAL LOW (ref 3.5–5.1)
Sodium: 137 mmol/L (ref 135–145)

## 2021-05-03 MED ORDER — TORSEMIDE 20 MG PO TABS: 20.0000 mg | ORAL_TABLET | Freq: Every day | ORAL | 2 refills | Status: DC

## 2021-05-03 NOTE — Patient Instructions (Addendum)
Medication Instructions:  Your physician has recommended you make the following change in your medication:   INCREASE Torsemide to 20 mg once a day  *If you need a refill on your cardiac medications before your next appointment, please call your pharmacy*   Lab Work: BMET today over at [employer] entrance  If you have labs (blood work) drawn today and your tests are completely normal, you will receive your results only by: [place] (if you have MyChart) OR A paper copy in the mail If you have any lab test that is abnormal or we need to change your treatment, we will call you to review the results.   Testing/Procedures: None   Follow-Up: At [facility], you and your health needs are our priority.  As part of our continuing mission to provide you with exceptional heart care, we have created designated Provider Care Teams.  These Care Teams include your primary Cardiologist (physician) and Advanced Practice Providers (APPs -  Physician Assistants and Nurse Practitioners) who all work together to provide you with the care you need, when you need it.   Your next appointment:   3 week(s)  The format for your next appointment:   In Person  Provider:   You may see [name], MD or one of the following Advanced Practice Providers on your designated Care Team:   Murray Hodgkins, NP Christell Faith, PA-C Marrianne Mood, PA-C Cadence [place], [place]

## 2021-05-03 NOTE — Progress Notes (Signed)
Follow-up Outpatient Visit Date: 05/03/2021  Primary Care Provider: [name], MD [address] [place] [address]  Chief Complaint: Shortness of breath and fluid retention  HPI:  Ms. Gunawan is a 75 y.o. female with history of CAD status post PCI to the ostial and mid LAD in the setting of unstable angina in 12/2019, chronic HFpEF, hypertension, type 2 diabetes mellitus, iron deficiency anemia following gastric bypass x2, irritable bowel syndrome, esophageal stenosis, chronic dizziness with gait instability, cervicogenic headache with cervical spine stenosis, obstructive sleep apnea on CPAP, restless leg syndrome, and adrenal mass, who presents for follow-up of coronary artery disease and HFpEF.  She was last seen in our office in early September by Tarri Glenn, [place], at which time she reported having a lot of pain from recent shingles episode.  Blood pressures were noted to be low at home following escalation of Lyrica.  She reported less oral intake due to shingles.  She was continued on her current medications.  It appears at some point nephrology increased her torsemide to 20 mg daily.  She was noted to be hypokalemic with acute kidney injury last week at her visit with Dr. Rogue Bussing.  Torsemide was decreased and potassium repleted.  She has remained on metolazone 2.5 mg daily, that was added by her PCP at some point in the last few months.  Since her torsemide was decreased, Ms. Shannon reports that she has felt progressively worse, with increasing leg swelling and aching, as well as exertional dyspnea and chest pressure.  She is finding it hard to cook an entire meal without having to sit down or take a break.  She has completed physical therapy and is not doing any regular exercises.  She also complains of headaches and dizziness.  She has difficulty swallowing and is scheduled to see Dr. Allen Norris later this month.  She reports that she is only eating a few bites of food  at meals but continues to put on weight.  --------------------------------------------------------------------------------------------------  Past Medical History:  Diagnosis Date   (HFpEF) heart failure with preserved ejection fraction ([place])    a. 12/2019 Echo: EF 65-70%, Gr2 DD. No significant valvular dzs.   Abnormality of gait 03/25/2013   Adrenal mass, left ([place])    Anemia    iron deficient post  2 unit txfsn 2009, normal endo/colonoscopy by [facility]   Arthritis    CAD (coronary artery disease) with h/o Atypical Chest Pain    a. 04/1986 Cath ([facility]): nl cors, EF 65%; b. 07/2012 Cath [facility]): Diff minor irregs; c. 07/2016 MV [facility]): "Equivocal"; d. 08/2016 Cardiac CT Ca2+ score [facility]): Ca2+ 3220; e. 05/2019 MV: No ischemia. EF 75%; f. 12/2019 PCI: LM nl, LAD 65ost (3.5x12 Resolute DES), 31m (2.75x12 Resolute DES),LCX/RCA nl; g. 08/2020 Cath: patent LAD stents, RCA 40ost, elev RH pressures, EF >65%.   Cervical spinal stenosis 1994   due to trauma to back ([employer] accident), has intermittent paralysis and parasthesias   Cervicogenic headache 03/23/2014   Depression    Dizziness    chronic dizziness   DJD (degenerative joint disease)    a. Chronic R shoulder pain pending R shoulder replacement 07/2017.   Esophageal stenosis March 2011   with transietn outlet obstruction by food, cleared by EGD    Family history of adverse reaction to anesthesia    daughter PONV   Gastric bypass status for obesity    Headache(784.0)    Hypertension    IBS (irritable bowel syndrome)  Left bundle branch block (LBBB)    a. Intermittently present - likely rate related. - patient denies   Obesity    Obstructive sleep apnea    using CPAP   Polyneuropathy in diabetes(357.2) 03/25/2013   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/09/2021   Right V1 distribution   Restless leg syndrome    Rotator cuff arthropathy, right 08/13/2017   Syncope and collapse 03/12/2014   Type II diabetes mellitus ([place])    Past Surgical History:   Procedure Laterality Date   ABDOMINAL HYSTERECTOMY     APPENDECTOMY     CARDIAC CATHETERIZATION     COLONOSCOPY     CORONARY STENT INTERVENTION N/A 01/04/2020   Procedure: CORONARY STENT INTERVENTION;  Surgeon: [name], MD;  Location: [facility];  Service: Cardiovascular;  Laterality: N/A;  LAD    DIAPHRAGMATIC HERNIA REPAIR  2015   ESOPHAGEAL DILATION     multiple   ESOPHAGOGASTRODUODENOSCOPY     GALLBLADDER SURGERY  1976   GALLBLADDER SURGERY  resection   GASTRIC BYPASS     GASTRIC BYPASS  2000, 2005   Dr. [name] IMPLANT PLACEMENT  April 2013   [place] REPLACEMENT  2007   bilateral knee. [name],  [name]   LEFT HEART CATH AND CORS/GRAFTS ANGIOGRAPHY N/A 12/31/2019   Procedure: LEFT HEART CATH AND CORS/GRAFTS ANGIOGRAPHY;  Surgeon: [name], MD;  Location: [facility];  Service: Cardiovascular;  Laterality: N/A;   PANNICULECTOMY  06/16/2019   PANNICULECTOMY N/A 06/16/2019   Procedure: PANNICULECTOMY;  Surgeon: [name], MD;  Location: [facility];  Service: Plastics;  Laterality: N/A;  3 hours, please   REVERSE SHOULDER ARTHROPLASTY Right 08/13/2017   Procedure: REVERSE RIGHT SHOULDER ARTHROPLASTY;  Surgeon: [name], MD;  Location: [facility];  Service: Orthopedics;  Laterality: Right;   RIGHT/LEFT HEART CATH AND CORONARY ANGIOGRAPHY N/A 09/06/2020   Procedure: RIGHT/LEFT HEART CATH AND CORONARY ANGIOGRAPHY;  Surgeon: [name], MD;  Location: [facility];  Service: Cardiovascular;  Laterality: N/A;   ROTATOR CUFF REPAIR     right   SPINE SURGERY  1995   [place]   TOTAL ABDOMINAL HYSTERECTOMY W/ BILATERAL SALPINGOOPHORECTOMY  0037   UMBILICAL HERNIA REPAIR  Aug 11, 2015    Current Meds  Medication Sig   amoxicillin (AMOXIL) 500 MG capsule 4 capsules 1-2 hours pre procedure   aspirin EC 81 MG tablet Take 1 tablet (81 mg total) by mouth daily.   atorvastatin (LIPITOR) 40 MG tablet Take 1 tablet (40 mg  total) by mouth daily.   clotrimazole-betamethasone (LOTRISONE) cream Apply 1 application topically 2 (two) times daily as needed.   Continuous Blood Gluc Receiver (FREESTYLE LIBRE 2 READER) DEVI Use to check sugar at least TID   Continuous Blood Gluc Sensor (FREESTYLE LIBRE 2 SENSOR) MISC 1 application by Does not apply route every 14 (fourteen) days.   cyanocobalamin (,VITAMIN B-12,) 1000 MCG/ML injection Inject 1 mL (1,000 mcg total) into the muscle every 30 (thirty) days. Inject 1 ml (1000 mcg ) IM weekly x 4,  Then monthly thereafter   dapagliflozin propanediol (FARXIGA) 10 MG TABS tablet Take 1 tablet (10 mg total) by mouth daily.   diphenoxylate-atropine (LOMOTIL) 2.5-0.025 MG tablet Take 1 tablet by mouth 4 (four) times daily as needed for diarrhea or loose stools.   erythromycin ophthalmic ointment 1 application at bedtime.   fexofenadine (ALLEGRA) 180 MG tablet Take 180 mg by mouth daily.   insulin aspart (  NOVOLOG FLEXPEN) 100 UNIT/ML FlexPen Inject 10-20 units up to three times daily before meals; If pre-meal glucose 100-150 - inject 10 units, 151-200 - inject 12 units; 201-250 - inject 14 units; 251-300 - inject 16 units, 301-350 - inject 18 units ; 350 > inject 20  units   insulin degludec (TRESIBA FLEXTOUCH) 100 UNIT/ML FlexTouch Pen Inject 15 Units into the skin daily.   Insulin Pen Needle (DROPLET PEN NEEDLES) 31G X 5 MM MISC USE ONE NEEDLE SUBCUTANEOUSLY AS DIRECTED (REMOVE AND DISCARD NEEDLE IN SHARPS CONTAINER IMMEDIATELY AFTER USE)   Lidocaine 4 % PTCH Apply topically daily.   metolazone (ZAROXOLYN) 2.5 MG tablet TAKE 1 TABLET (2.5 MG TOTAL) BY MOUTH DAILY. 30 MINUTES BEFORE TORSEMIDE DOSE   nitroGLYCERIN (NITROSTAT) 0.4 MG SL tablet Place 1 tablet (0.4 mg total) under the tongue every 5 (five) minutes as needed for chest pain.   NONFORMULARY OR COMPOUNDED [place]  Combination Pain Cream -  Baclofen 2%, Doxepin 5%, Gabapentin 6%, Topiramate 2%, Pentoxifylline  3% Apply 1-2 grams to affected area 3-4 times daily Qty. 120 gm 3 refills   nystatin (MYCOSTATIN/NYSTOP) powder Apply 1 g topically 4 (four) times daily as needed (rash).   ondansetron (ZOFRAN-ODT) 4 MG disintegrating tablet DISSOLVE ONE TABLET ON TONGUE EVERY 8 HOURS AS NEEDED FOR NAUSEA OR VOMITING   Polyethyl Glycol-Propyl Glycol (SYSTANE) 0.4-0.3 % GEL ophthalmic gel Place 1 application into both eyes.   potassium chloride 20 MEQ/15ML (10%) SOLN Take 15 mLs (20 mEq total) by mouth 2 (two) times daily.   pregabalin (LYRICA) 50 MG capsule Take 1 capsule (50 mg total) by mouth 2 (two) times daily.   Semaglutide,0.25 or 0.5MG /DOS, (OZEMPIC, 0.25 OR 0.5 MG/DOSE,) 2 MG/1.5ML SOPN Inject 0.5 mg into the skin once a week.   torsemide (DEMADEX) 20 MG tablet Take 1 tablet (20 mg total) by mouth daily.   valACYclovir (VALTREX) 500 MG tablet Take 500 mg by mouth 2 (two) times daily.   [DISCONTINUED] torsemide (DEMADEX) 10 MG tablet TAKE 1 TABLET BY MOUTH EVERY DAY    Allergies: Demeclocycline, Erythromycin, Flagyl [metronidazole], Glucophage [metformin hcl], Tetracyclines & related, Diovan [valsartan], Sulfa antibiotics, and Xanax [alprazolam]  Social History   Tobacco Use   Smoking status: Never   Smokeless tobacco: Never  Vaping Use   Vaping Use: Never used  Substance Use Topics   Alcohol use: No   Drug use: Yes    Comment: prescribed valium    Family History  Problem Relation Age of Onset   Heart disease Father    Hypertension Father    Prostate cancer Father    Stroke Father    Osteoporosis Father    Stroke Mother    Depression Mother    Headache Mother    Heart disease Mother    Thyroid disease Mother    Hypertension Mother    Diabetes Daughter    Heart disease Daughter    Hypertension Daughter    Hypertension Son     Review of Systems: A 12-system review of systems was performed and was negative except as noted in the  HPI.  --------------------------------------------------------------------------------------------------  Physical Exam: BP 110/66 (BP Location: Left Arm, Patient Position: Sitting, Cuff Size: Large)   Pulse 91   Ht 5' 4.5" (1.638 m)   Wt 244 lb (110.7 kg)   SpO2 99%   BMI 41.24 kg/m   General:  NAD.  Accompanied by her husband. Neck: Unable to assess JVP due to body habitus. Lungs: Clear  to auscultation bilaterally without wheezes or crackles. Heart: Regular rate and rhythm without murmurs, rubs, or gallops. Abdomen: Soft, nontender, nondistended. Extremities: Trace pretibial edema with compression stockings in place.  EKG:  Baseline wander noted.  Normal sinus rhythm with PAC's, mild QT prolongation, and nonspecific ST changes.  QTc slightly longer compared with 03/23/2021.  Otherwise, no significant interval change.  Lab Results  Component Value Date   WBC 13.0 (H) 04/26/2021   HGB 13.7 04/26/2021   HCT 42.0 04/26/2021   MCV 96.8 04/26/2021   PLT 308 04/26/2021    Lab Results  Component Value Date   NA 137 05/03/2021   K 3.2 (L) 05/03/2021   CL 101 05/03/2021   CO2 25 05/03/2021   BUN 20 05/03/2021   CREATININE 1.33 (H) 05/03/2021   GLUCOSE 184 (H) 05/03/2021   ALT 20 02/08/2021    Lab Results  Component Value Date   CHOL 134 05/24/2020   HDL 60.40 05/24/2020   LDLCALC 48 05/24/2020   LDLDIRECT 111.0 06/16/2015   TRIG 128.0 05/24/2020   CHOLHDL 2 05/24/2020    --------------------------------------------------------------------------------------------------  ASSESSMENT AND PLAN: Acute on chronic HFpEF: Ms. Hitz reports significant worsening of dyspnea, edema, and chest pressure with deescalation of torsemide earlier this month in the setting of AKI and hypokalemia.  Volume exam is very challenging due to her morbid obesity.  She also has poor functional status at baseline.  We will recheck a BMP today with plans to increase torsemide to 20 mg daily.  I  favor stopping metolazone and adding spironolactone, based on results of BMP.  I suspect her heart failure symptoms are largely driven by right heart failure and that she has a narrow euvolemic window.  Continue dapagliflozin in the setting of HFpEF and DM.  She was previously seen by AHF clinic; symptoms were felt to be primarily due to deconditioning and morbid obesity.  Coronary artery disease: Increased chest pressure reported in the setting of fluid retention.  Repeat cath in 08/2020 showed non-obstructive disease with patent LAD stents.  EKG today without acute ischemic changes.  I favor management of heart failure, as above, and continuation of current medications for secondary prevention.  Hyperlipidemia associated with type 2 diabetes mellitus: Lipids at goal on last check in 05/2020.  Continue atorvastatin and ongoing management of DM per PCP.  Morbid obesity: BMI remains >40 with multiple comorbidities.  Importance of weight loss through diet and exercise were discussed again at length today.  We are only going to be able to improve heart failure management if Ms. Leone is able to lose weight and improve her function status.  Follow-up: Return to clinic in 3 weeks.  Approximately 50 minutes were devoted to this encounter, of which greater than 50% was spent face-to-face with the patient in counseling.  [name], MD 05/04/2021 7:15 PM

## 2021-05-03 NOTE — Progress Notes (Signed)
Patient ID: Denise Sharp, female   DOB: [birth year], 75 y.o.   MRN: [id number]  Checking out patient and she requested I call and cancel the [facility] because she has tons of that medication at home. She states that when she needs to reorder from [facility] she will call us to send that in for her. Reviewed that we would call to arrange her follow up appointment and to proceed to [facility] entrance to have her labs done. She verbalized understanding of all instructions from her visit today with no further questions.   Called [facility] and they will cancel the prescription.

## 2021-05-04 ENCOUNTER — Encounter: Payer: Self-pay | Admitting: Internal Medicine

## 2021-05-04 ENCOUNTER — Telehealth: Payer: Self-pay | Admitting: Internal Medicine

## 2021-05-04 ENCOUNTER — Telehealth: Payer: Self-pay

## 2021-05-04 DIAGNOSIS — E876 Hypokalemia: Secondary | ICD-10-CM

## 2021-05-04 DIAGNOSIS — Z79899 Other long term (current) drug therapy: Secondary | ICD-10-CM

## 2021-05-04 NOTE — Telephone Encounter (Signed)
2nd attempt to contact the patient. Unable to lmom.

## 2021-05-04 NOTE — Telephone Encounter (Signed)
End, Harrell Gave, MD  P [facility] [place] Triage Creatinine is back to baseline, improved from a week ago.  Potassium is still but better.  I think it is reasonable to increase torsemide to 20 mg daily, as discussed yesterday.  I suggest we stop metolazone, add spironolactone 12.5 mg daily, and increase potassium chloride to 20 mg 3 times daily.  We should plan to repeat a BMP on Monday

## 2021-05-04 NOTE — Chronic Care Management (AMB) (Signed)
  Care Management   Note  05/04/2021 Name: Denise Sharp MRN: [id number] DOB: 18-Oct-1945  Denise Sharp is a 75 y.o. year old female who is a primary care patient of [name], Aris Everts, MD and is actively engaged with the care management team. I reached out to [name] by phone today to assist with re-scheduling a follow up visit with the Pharmacist  Follow up plan: Unsuccessful telephone outreach attempt made. A HIPAA compliant phone message was left for the patient providing contact information and requesting a return call.  The care management team will reach out to the patient again over the next 3 days.  If patient returns call to provider office, please advise to call [facility]  at [place], [place], [place], [place] [address] Direct Dial: [phone] [email or web address] Website: [email or web address]

## 2021-05-04 NOTE — Chronic Care Management (AMB) (Signed)
  Care Management   Note  05/04/2021 Name: Denise Sharp MRN: [id number] DOB: [birth year]  ARTHELIA CALLICOTT is a 75 y.o. year old female who is a primary care patient of [name], Aris Everts, MD and is actively engaged with the care management team. I reached out to [name] by phone today to assist with re-scheduling a follow up visit with the Pharmacist  Follow up plan: Face to Face appointment with care management team member scheduled for: 05/08/2021  Noreene Larsson, [place], [place], [place] [address] Direct Dial: [phone] [email or web address]: [email or web address]

## 2021-05-04 NOTE — Telephone Encounter (Signed)
Called to give the patients lab results and Dr. Darnelle Bos recommendation. Lmtcb on the patients home telephone Called the patients cell. Unable to lmom patients voicemail is full.

## 2021-05-04 NOTE — Telephone Encounter (Signed)
3 week fu per check out 05-03-21 [facility]   No ans no vm .

## 2021-05-06 ENCOUNTER — Other Ambulatory Visit: Payer: Self-pay | Admitting: Internal Medicine

## 2021-05-08 ENCOUNTER — Other Ambulatory Visit: Payer: Self-pay

## 2021-05-08 ENCOUNTER — Ambulatory Visit (INDEPENDENT_AMBULATORY_CARE_PROVIDER_SITE_OTHER): Payer: Medicare Other | Admitting: Pharmacist

## 2021-05-08 ENCOUNTER — Telehealth: Payer: Medicare Other

## 2021-05-08 ENCOUNTER — Other Ambulatory Visit: Payer: Self-pay | Admitting: *Deleted

## 2021-05-08 DIAGNOSIS — E1142 Type 2 diabetes mellitus with diabetic polyneuropathy: Secondary | ICD-10-CM

## 2021-05-08 MED ORDER — SPIRONOLACTONE 25 MG PO TABS: 12.5000 mg | ORAL_TABLET | Freq: Every day | ORAL | 3 refills | Status: DC

## 2021-05-08 MED ORDER — OZEMPIC (1 MG/DOSE) 4 MG/3ML ~~LOC~~ SOPN: 1.0000 mg | PEN_INJECTOR | SUBCUTANEOUS | 1 refills | Status: DC

## 2021-05-08 MED ORDER — NOVOLOG FLEXPEN 100 UNIT/ML ~~LOC~~ SOPN: PEN_INJECTOR | SUBCUTANEOUS | 3 refills | Status: DC

## 2021-05-08 MED ORDER — POTASSIUM CHLORIDE 20 MEQ/15ML (10%) PO SOLN: 20.0000 meq | Freq: Three times a day (TID) | ORAL | 0 refills | Status: DC

## 2021-05-08 MED ORDER — TRESIBA FLEXTOUCH 100 UNIT/ML ~~LOC~~ SOPN: 15.0000 [IU] | PEN_INJECTOR | Freq: Every day | SUBCUTANEOUS | 3 refills | Status: DC

## 2021-05-08 NOTE — Telephone Encounter (Signed)
I spoke with the patient regarding her most recent lab results and Dr. Darnelle Bos recommendations to:  1) increase torsemide to 20 mg once daily as discussed- I have confirmed with the patient she is already doing this 2) stop metolazone 3) start spironolactone 12.5 mg once daily  4) increase potassium chloride to 20 meq TID 5) repeat a BMP Thursday/ Friday this week (as we were unable to reach her prior to the weekend)  The patient voices understanding of the above recommendations and is agreeable.  She would like the spironolactone sent to the local pharmacy for a # 30 day supply. She take liquid potassium and is ok on this for now- no refill needed. She is supposed to see the "throat doctor" on Wednesday to discuss possibly having her esophagus stretched so she can go back on the tablets as the liquid potassium cost $400.  She is agreeable with a repeat BMP on 05/11/21 at 11:15 am in the office.

## 2021-05-08 NOTE — Patient Instructions (Signed)
Ghina,   It was great seeing you today!  Increase Ozempic to 1 mg weekly.   Continue Tresiba 15 units daily. Continue Farxiga 10 mg daily. Continue your Novolog sliding scale, but take a dose with BREAKFAST, LUNCH, and SUPPER.   Take care,   Catie Darnelle Maffucci, PharmD  Visit Information  PATIENT GOALS:  Goals Addressed               This Visit's Progress     Patient Stated     Medication Monitoring (pt-stated)        Patient Goals/Self-Care Activities Over the next 90 days, patient will:  - take medications as prescribed check blood glucose at least three times daily using CGM, document, and provide at future appointments         Print copy of patient instructions, educational materials, and care plan provided in person.   Plan: Face to Face appointment with care management team member scheduled for: 6 weeks  Catie Darnelle Maffucci, PharmD, [place], [place] Clinical Pharmacist [employer] at [employer] [phone]

## 2021-05-08 NOTE — Progress Notes (Signed)
Stopping metolazone

## 2021-05-08 NOTE — Chronic Care Management (AMB) (Signed)
Chronic Care Management Pharmacy Note  05/08/2021 Name:  Denise Sharp MRN:  [id number] DOB:  1946/02/10  Subjective: Denise Sharp is an 75 y.o. year old female who is a primary patient of [name], Aris Everts, MD.  The CCM team was consulted for assistance with disease management and care coordination needs.    Engaged with patient face to face for follow up visit for pharmacy case management and/or care coordination services.   Objective:  Medications Reviewed Today     Reviewed by De Hollingshead, RPH-CPP (Pharmacist) on 05/08/21 at [place] List Status: <None>   Medication Order Taking? Sig Documenting Provider Last Dose Status Informant  amoxicillin (AMOXIL) 500 MG capsule [id number] No 4 capsules 1-2 hours pre procedure  Patient not taking: Reported on 05/08/2021   [name], MD Not Taking Active   aspirin EC 81 MG tablet [id number] Yes Take 1 tablet (81 mg total) by mouth daily. Fritzi Mandes, MD Taking Active Self  atorvastatin (LIPITOR) 40 MG tablet [id number] Yes Take 1 tablet (40 mg total) by mouth daily. [name], MD Taking Active   clotrimazole-betamethasone Donalynn Furlong) cream [id number]  Apply 1 application topically 2 (two) times daily as needed. [provider]  Active Self  Continuous Blood Gluc Receiver (FREESTYLE LIBRE 2 READER) DEVI [id number] Yes Use to check sugar at least TID [name], MD Taking Active Self  Continuous Blood Gluc Sensor (FREESTYLE LIBRE 2 SENSOR) MISC [id number] Yes 1 application by Does not apply route every 14 (fourteen) days. [name], MD Taking Active   cyanocobalamin (,VITAMIN B-12,) 1000 MCG/ML injection [id number] Yes Inject 1 mL (1,000 mcg total) into the muscle every 30 (thirty) days. Inject 1 ml (1000 mcg ) IM weekly x 4,  Then monthly thereafter [name], MD Taking Active   dapagliflozin propanediol (FARXIGA) 10 MG TABS tablet [id number] Yes Take 1 tablet (10 mg total) by mouth daily. [name], MD Taking Active   diphenoxylate-atropine (LOMOTIL) 2.5-0.025 MG tablet [id number] Yes Take 1 tablet by mouth 4 (four) times daily as needed for diarrhea or loose stools. [name], MD Taking Active   erythromycin ophthalmic ointment [id number]  1 application at bedtime. [provider]  Active   fexofenadine (ALLEGRA) 180 MG tablet [id number] No Take 180 mg by mouth daily.  Patient not taking: Reported on 05/08/2021   [provider] Not Taking Active Self  insulin aspart (NOVOLOG FLEXPEN) 100 UNIT/ML FlexPen [id number] Yes Inject 10-20 units up to three times daily before meals; If pre-meal glucose 100-150 - inject 10 units, 151-200 - inject 12 units; 201-250 - inject 14 units; 251-300 - inject 16 units, 301-350 - inject 18 units ; 350 > inject 20  units Tullo, Aris Everts, MD Taking Active   insulin degludec (TRESIBA FLEXTOUCH) 100 UNIT/ML FlexTouch Pen [id number] Yes Inject 15 Units into the skin daily. [name], MD Taking Active   Insulin Pen Needle (DROPLET PEN NEEDLES) 31G X 5 MM MISC [id number] Yes USE ONE NEEDLE SUBCUTANEOUSLY AS DIRECTED (REMOVE AND DISCARD NEEDLE IN SHARPS CONTAINER IMMEDIATELY AFTER USE) [name], MD Taking Active   Lidocaine 4 % PTCH [id number] No Apply topically daily.  Patient not taking: Reported on 05/08/2021   [provider] Not Taking Active   nitroGLYCERIN (NITROSTAT) 0.4 MG SL tablet [id number] No Place 1 tablet (0.4 mg total) under the tongue every 5 (five) minutes as needed for chest pain.  Patient not taking:  Reported on 05/08/2021   Theora Gianotti, NP Not Taking Active Self  NONFORMULARY OR COMPOUNDED [place] [id number] Yes [facility]  Combination Pain Cream -  Baclofen 2%, Doxepin 5%, Gabapentin 6%, Topiramate 2%, Pentoxifylline 3% Apply 1-2 grams to affected area 3-4 times daily Qty. 120 gm 3 refills Gardiner Barefoot, DPM Taking Active Self           Med Note [facility], BRANDY L   Fri  Sep 02, 2020  2:40 PM)    nystatin (MYCOSTATIN/NYSTOP) powder [id number]  Apply 1 g topically 4 (four) times daily as needed (rash). [provider]  Active Self  ondansetron (ZOFRAN-ODT) 4 MG disintegrating tablet [id number] Yes DISSOLVE ONE TABLET ON TONGUE EVERY 8 HOURS AS NEEDED FOR NAUSEA OR VOMITING [name], MD Taking Active   Polyethyl Glycol-Propyl Glycol (SYSTANE) 0.4-0.3 % GEL ophthalmic gel [id number]  Place 1 application into both eyes. [provider]  Active   potassium chloride 20 MEQ/15ML (10%) SOLN [id number] Yes Take 15 mLs (20 mEq total) by mouth 3 (three) times daily. End, Harrell Gave, MD Taking Active   pregabalin (LYRICA) 50 MG capsule [id number] Yes Take 1 capsule (50 mg total) by mouth 2 (two) times daily. Kathrynn Ducking, MD Taking Active   Semaglutide,0.25 or 0.5MG /DOS, (OZEMPIC, 0.25 OR 0.5 MG/DOSE,) 2 MG/1.5ML SOPN [id number] Yes Inject 0.5 mg into the skin once a week. [name], MD Taking Active   spironolactone (ALDACTONE) 25 MG tablet [id number] Yes Take 0.5 tablets (12.5 mg total) by mouth daily. End, Harrell Gave, MD Taking Active   torsemide (DEMADEX) 20 MG tablet [id number] Yes Take 1 tablet (20 mg total) by mouth daily. End, Harrell Gave, MD Taking Active   valACYclovir (VALTREX) 500 MG tablet [id number] Yes Take 500 mg by mouth 2 (two) times daily. [provider] Taking Active   Med List Note Saunders Revel T, CPhT 07/22/17 1307): CPAP            Lab Results  Component Value Date   HGBA1C 8.9 (H) 02/08/2021   Lab Results  Component Value Date   CREATININE 1.33 (H) 05/03/2021   BUN 20 05/03/2021   NA 137 05/03/2021   K 3.2 (L) 05/03/2021   CL 101 05/03/2021   CO2 25 05/03/2021   Lab Results  Component Value Date   CHOL 134 05/24/2020   HDL 60.40 05/24/2020   LDLCALC 48 05/24/2020   LDLDIRECT 111.0 06/16/2015   TRIG 128.0 05/24/2020   CHOLHDL 2 05/24/2020     Assessment/Interventions: Review of patient  past medical history, allergies, medications, health status, including review of consultants reports, laboratory and other test data, was performed as part of comprehensive evaluation and provision of chronic care management services.   SDOH:  (Social Determinants of Health) assessments and interventions performed: Yes SDOH Interventions    Flowsheet Row Most Recent Value  SDOH Interventions   Financial Strain Interventions Intervention Not Indicated        CCM Care Plan  Review of patient past medical history, allergies, medications, health status, including review of consultants reports, laboratory and other test data, was performed as part of comprehensive evaluation and provision of chronic care management services.   Conditions to be addressed/monitored:  Hyperlipidemia, Diabetes, and Heart Failure  Care Plan : Medication Management  Updates made by De Hollingshead, RPH-CPP since 05/08/2021 12:00 AM     Problem: Diabetes, CHF      Long-Range Goal: Disease Progression Prevention   This Visit's Progress: On  track  Recent Progress: On track  Priority: High  Note:   Current Barriers:  Unable to achieve control of diabetes   Pharmacist Clinical Goal(s):  Over the next 90 days, patient will achieve control of diabetes as evidenced by improvement in A1c through collaboration with PharmD and provider  Interventions: 1:1 collaboration with [name], MD regarding development and update of comprehensive plan of care as evidenced by provider attestation and co-signature Inter-disciplinary care team collaboration (see longitudinal plan of care) Comprehensive medication review performed; medication list updated in electronic medical record  Health Maintenance Yearly diabetic eye exam: due Yearly diabetic foot exam: up to date Urine microalbumin: up to date Yearly influenza vaccination: due Td/Tdap vaccination: up to date Pneumonia vaccination: up to date COVID  vaccinations: up to date Shingrix vaccinations: due  Colonoscopy: up to date   Diabetes: Uncontrolled; current treatment: Farxiga 10 mg daily, Tresiba 15 units daily, Novolog 10-20 units up to TID with meals - though today patient notes that she is not taking a dose with lunch, Ozempic 0.5 mg weekly Denies GI upset, diarrhea, constipation with Ozempic.  Hx intolerance to metformin d/t diarrhea Hx Trulicity - exacerbated diarrhea  Hx glipizide - stopped previously when A1c was well controlled  Hx Januvia - stopped previously when Trulicity was started  Avoiding TZD due to HF Current glucose readings: using Libre 2 CGM Date of Download: 10/11-10/24/22 % Time CGM is active: 66% Average Glucose: 220 mg/dL Glucose Management Indicator: 8.6  Glucose Variability: 46 (goal <36%) Time in Goal:  - Time in range 70-180: 46% - Time above range: 54% - Time below range: 0% Observed patterns: post lunch spike Reiterated to take Novolog dose with lunch. This should significantly help manage afternoon spikes.  Increase Ozempic to 1 mg daily   Heart Failure, preserved:  Appropriately managed; current treatment:  ARNI/ACEi/ARB: none d/t intolerance  Beta blocker: metoprolol tartrate 12.5 mg BID SGLT2: Farxiga 10 mg daily Mineralocorticoid Receptor Antagonist: spironolactone 12.5 mg daily Diuretic: torsemide 20 mg daily + potassium oral TID  Confirms understanding of importance of weighing daily. Confirms understanding to contact cardiology/primary care team if weight gain >3 lbs in 1 day or >5 lbs in 1 week Recommended to continue current regimen at this time  Hyperlipidemia, secondary ASCVD prevention: Controlled per last lab result; current treatment: atorvastatin 40 mg daily  Current antiplatelet regimen: aspirin 81 mg daily Previously recommended to continue current regimen at this time  Nocturnal "leg cramps", as well as chronic pain More exacerbated recently per patient report. Current  regimen: pregabalin 50 mg BID, valacyclovir 500 mg BID established with Dr. Jannifer Franklin neurology.  Previously recommended to continue current regimen at this time.  Chronic Diarrhea, S/p Gastric Bypass, Esophageal "growth" Moderately well managed, given patient history. Current regimen: lomotil 4 tabs daily and loperamide BID Previously recommended to continue current regimen at this time  COPD, allergies:  Well controlled; Current regimen: Anoro 62.5/25 mcg daily, albuterol HFA PRN; fexofenadine 180 mg daily  Previously recommended to continue current regimen at this time.  Patient Goals/Self-Care Activities Over the next 90 days, patient will:  - take medications as prescribed check blood glucose at least three times daily using CGM, document, and provide at future appointments  Follow Up Plan: Face to Face appointment with care management team member scheduled for:  ~ 6 weeks      Plan: Face to Face appointment with care management team member scheduled for: 6 weeks  Catie Darnelle Maffucci, PharmD, [place], CPP  Contractor at [place]

## 2021-05-10 ENCOUNTER — Ambulatory Visit (INDEPENDENT_AMBULATORY_CARE_PROVIDER_SITE_OTHER): Payer: Medicare Other | Admitting: Gastroenterology

## 2021-05-10 ENCOUNTER — Other Ambulatory Visit: Payer: Self-pay

## 2021-05-10 ENCOUNTER — Encounter: Payer: Self-pay | Admitting: Gastroenterology

## 2021-05-10 VITALS — BP 118/72 | HR 70 | Temp 97.2°F | Ht 64.5 in | Wt 245.4 lb

## 2021-05-10 DIAGNOSIS — R1319 Other dysphagia: Secondary | ICD-10-CM

## 2021-05-10 DIAGNOSIS — I25119 Atherosclerotic heart disease of native coronary artery with unspecified angina pectoris: Secondary | ICD-10-CM

## 2021-05-10 NOTE — Progress Notes (Signed)
Primary Care Physician: [name], MD  Primary Gastroenterologist:  Dr. [name]  Chief Complaint  Patient presents with   Dysphagia    HPI: Denise Sharp is a 75 y.o. female here with a history of having dysphagia.  The patient had seen me previously and was on Plavix and could not stop the Plavix at that time.  The patient was sent for a barium swallow that showed a mild narrowing in the distal esophagus. The patient did not undergo a EGD at that time due to her being on Plavix. The patient denies being on a PPI at the present time and states that she stopped her PPI over 10 years ago because she denies having any symptoms of heartburn.  The patient does report that she has a lot of belching.  The patient has been having trouble with her dentures and has not been able to chew well and is following up with her dentist today.  Past Medical History:  Diagnosis Date   (HFpEF) heart failure with preserved ejection fraction ([place])    a. 12/2019 Echo: EF 65-70%, Gr2 DD. No significant valvular dzs.   Abnormality of gait 03/25/2013   Adrenal mass, left ([place])    Anemia    iron deficient post  2 unit txfsn 2009, normal endo/colonoscopy by Mukhtar Shams   Arthritis    CAD (coronary artery disease) with h/o Atypical Chest Pain    a. 04/1986 Cath ([facility]): nl cors, EF 65%; b. 07/2012 Cath [facility]): Diff minor irregs; c. 07/2016 MV [facility]): "Equivocal"; d. 08/2016 Cardiac CT Ca2+ score [facility]): Ca2+ 2355; e. 05/2019 MV: No ischemia. EF 75%; f. 12/2019 PCI: LM nl, LAD 65ost (3.5x12 Resolute DES), 37m (2.75x12 Resolute DES),LCX/RCA nl; g. 08/2020 Cath: patent LAD stents, RCA 40ost, elev RH pressures, EF >65%.   Cervical spinal stenosis 1994   due to trauma to back ([employer] accident), has intermittent paralysis and parasthesias   Cervicogenic headache 03/23/2014   Depression    Dizziness    chronic dizziness   DJD (degenerative joint disease)    a. Chronic R shoulder pain pending R shoulder replacement  07/2017.   Esophageal stenosis March 2011   with transietn outlet obstruction by food, cleared by EGD    Family history of adverse reaction to anesthesia    daughter PONV   Gastric bypass status for obesity    Headache(784.0)    Hypertension    IBS (irritable bowel syndrome)    Left bundle branch block (LBBB)    a. Intermittently present - likely rate related. 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 30 tablet 2   atorvastatin (LIPITOR) 40 MG tablet Take 1 tablet (40 mg total) by mouth daily. 90 tablet 0   clotrimazole-betamethasone (LOTRISONE) cream Apply 1 application topically 2 (two) times daily as needed.     Continuous Blood Gluc Receiver (FREESTYLE LIBRE 2 READER) DEVI Use to check sugar at least TID 1 each 1   Continuous Blood Gluc Sensor (FREESTYLE LIBRE 2 SENSOR) MISC 1 application  by Does not apply route every 14 (fourteen) days. 2 each 0   cyanocobalamin (,VITAMIN B-12,) 1000 MCG/ML injection Inject 1 mL (1,000 mcg total) into the muscle every 30 (thirty) days. Inject 1 ml (1000 mcg ) IM weekly x 4,  Then monthly thereafter 3 mL 1   dapagliflozin propanediol (FARXIGA) 10 MG TABS tablet Take 1 tablet (10 mg total) by mouth daily. 90 tablet 3   diphenoxylate-atropine (LOMOTIL) 2.5-0.025 MG tablet Take 1 tablet by mouth 4 (four) times daily as needed for diarrhea or loose stools. 60 tablet 5   erythromycin ophthalmic ointment 1 application at bedtime.     insulin aspart (NOVOLOG FLEXPEN) 100 UNIT/ML FlexPen Inject 10-20 units up to three times daily before meals; If pre-meal  glucose 100-150 - inject 10 units, 151-200 - inject 12 units; 201-250 - inject 14 units; 251-300 - inject 16 units, 301-350 - inject 18 units ; 350 > inject 20  units 54 mL 3   insulin degludec (TRESIBA FLEXTOUCH) 100 UNIT/ML FlexTouch Pen Inject 15 Units into the skin daily. 15 mL 3   Insulin Pen Needle (DROPLET PEN NEEDLES) 31G X 5 MM MISC USE ONE NEEDLE SUBCUTANEOUSLY AS DIRECTED (REMOVE AND DISCARD NEEDLE IN SHARPS CONTAINER IMMEDIATELY AFTER USE) 100 each 1   NONFORMULARY OR COMPOUNDED ITEM [facility]  Combination Pain Cream -  Baclofen 2%, Doxepin 5%, Gabapentin 6%, Topiramate 2%, Pentoxifylline 3% Apply 1-2 grams to affected area 3-4 times daily Qty. 120 gm 3 refills 1 each 3   nystatin (MYCOSTATIN/NYSTOP) powder Apply 1 g topically 4 (four) times daily as needed (rash).     ondansetron (ZOFRAN-ODT) 4 MG disintegrating tablet DISSOLVE ONE TABLET ON TONGUE EVERY 8 HOURS AS NEEDED FOR NAUSEA OR VOMITING 30 tablet 0   Polyethyl Glycol-Propyl Glycol (SYSTANE) 0.4-0.3 % GEL ophthalmic gel Place 1 application into both eyes.     potassium chloride 20 MEQ/15ML (10%) SOLN Take 15 mLs (20 mEq total) by mouth 3 (three) times daily. 473 mL 0   pregabalin (LYRICA) 50 MG capsule Take 1 capsule (50 mg total) by mouth 2 (two) times daily. 60 capsule 3   Semaglutide, 1 MG/DOSE, (OZEMPIC, 1 MG/DOSE,) 4 MG/3ML SOPN Inject 1 mg into the skin once a week. 9 mL 1   spironolactone (ALDACTONE) 25 MG tablet Take 0.5 tablets (12.5 mg total) by mouth daily. 15 tablet 3   torsemide (DEMADEX) 20 MG tablet Take 1 tablet (20 mg total) by mouth daily. 30 tablet 2   valACYclovir (VALTREX) 500 MG tablet Take 500 mg by mouth 2 (two) times daily.     amoxicillin (AMOXIL) 500 MG capsule 4 capsules 1-2 hours pre procedure (Patient not taking: No sig reported) 4 capsule 1   fexofenadine (ALLEGRA) 180 MG tablet Take 180 mg by mouth daily. (Patient not taking: No sig reported)     Lidocaine 4 % PTCH Apply topically daily.  (Patient not taking: No sig reported)     nitroGLYCERIN (NITROSTAT) 0.4 MG SL tablet Place 1 tablet (0.4 mg total) under the tongue every 5 (five) minutes as needed for chest pain. (Patient not taking: No sig reported) 25 tablet 1   No current facility-administered medications for this visit.    Allergies as of 05/10/2021 - Review Complete 05/10/2021  Allergen Reaction Noted   Demeclocycline Hives 09/18/2013   Erythromycin Nausea And Vomiting and Other (See Comments) 08/17/2011   Flagyl [metronidazole] Nausea And Vomiting and Other (See Comments) 08/17/2011   Glucophage [metformin hcl] Nausea And  Vomiting and Other (See Comments) 08/17/2011   Tetracyclines & related Hives and Rash 03/12/2011   Diovan [valsartan] Nausea Only 08/17/2011   Sulfa antibiotics Rash and Other (See Comments) 09/18/2013   Xanax [alprazolam] Other (See Comments) 08/17/2011    ROS:  General: Negative for anorexia, weight loss, fever, chills, fatigue, weakness. ENT: Negative for hoarseness, difficulty swallowing , nasal congestion. CV: Negative for chest pain, angina, palpitations, dyspnea on exertion, peripheral edema.  Respiratory: Negative for dyspnea at rest, dyspnea on exertion, cough, sputum, wheezing.  GI: See history of present illness. GU:  Negative for dysuria, hematuria, urinary incontinence, urinary frequency, nocturnal urination.  Endo: Negative for unusual weight change.    Physical Examination:   BP 118/72 (BP Location: Right Arm, Patient Position: Sitting, Cuff Size: Normal)   Pulse 70   Temp (!) 97.2 F (36.2 C) (Temporal)   Ht 5' 4.5" (1.638 m)   Wt 245 lb 6.4 oz (111.3 kg)   BMI 41.47 kg/m   General: Well-nourished, well-developed in no acute distress.  Eyes: No icterus. Conjunctivae pink. Neuro: Alert and oriented x 3.  Grossly intact. Skin: Warm and dry, no jaundice.   Psych: Alert and cooperative, normal mood and affect.  Labs:    Imaging Studies: No results  found.  Assessment and Plan:   Denise Sharp is a 75 y.o. y/o female who comes in today with a history of dysphagia and esophageal stricture seen on imaging.  The patient could not have a upper endoscopy previously due to her being on Plavix.  The patient has now been off Plavix and is ready to be set up for the upper endoscopy.  The patient will be set up for an EGD.  The patient has been explained the plan and agrees with it.     [name], MD. Marval Regal    Note: This dictation was prepared with Dragon dictation along with smaller phrase technology. Any transcriptional errors that result from this process are unintentional.

## 2021-05-10 NOTE — H&P (View-Only) (Signed)
Primary Care Physician: [name], MD  Primary Gastroenterologist:  Dr. [name]  Chief Complaint  Patient presents with   Dysphagia    HPI: Denise Sharp is a 75 y.o. female here with a history of having dysphagia.  The patient had seen me previously and was on Plavix and could not stop the Plavix at that time.  The patient was sent for a barium swallow that showed a mild narrowing in the distal esophagus. The patient did not undergo a EGD at that time due to her being on Plavix. The patient denies being on a PPI at the present time and states that she stopped her PPI over 10 years ago because she denies having any symptoms of heartburn.  The patient does report that she has a lot of belching.  The patient has been having trouble with her dentures and has not been able to chew well and is following up with her dentist today.  Past Medical History:  Diagnosis Date   (HFpEF) heart failure with preserved ejection fraction ([place])    a. 12/2019 Echo: EF 65-70%, Gr2 DD. No significant valvular dzs.   Abnormality of gait 03/25/2013   Adrenal mass, left ([place])    Anemia    iron deficient post  2 unit txfsn 2009, normal endo/colonoscopy by Beola Vasallo   Arthritis    CAD (coronary artery disease) with h/o Atypical Chest Pain    a. 04/1986 Cath ([facility]): nl cors, EF 65%; b. 07/2012 Cath [facility]): Diff minor irregs; c. 07/2016 MV [facility]): "Equivocal"; d. 08/2016 Cardiac CT Ca2+ score [facility]): Ca2+ 3299; e. 05/2019 MV: No ischemia. EF 75%; f. 12/2019 PCI: LM nl, LAD 65ost (3.5x12 Resolute DES), 71m (2.75x12 Resolute DES),LCX/RCA nl; g. 08/2020 Cath: patent LAD stents, RCA 40ost, elev RH pressures, EF >65%.   Cervical spinal stenosis 1994   due to trauma to back ([employer] accident), has intermittent paralysis and parasthesias   Cervicogenic headache 03/23/2014   Depression    Dizziness    chronic dizziness   DJD (degenerative joint disease)    a. Chronic R shoulder pain pending R shoulder replacement  07/2017.   Esophageal stenosis March 2011   with transietn outlet obstruction by food, cleared by EGD    Family history of adverse reaction to anesthesia    daughter PONV   Gastric bypass status for obesity    Headache(784.0)    Hypertension    IBS (irritable bowel syndrome)    Left bundle branch block (LBBB)    a. Intermittently present - likely rate related. 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 30 tablet 2   atorvastatin (LIPITOR) 40 MG tablet Take 1 tablet (40 mg total) by mouth daily. 90 tablet 0   clotrimazole-betamethasone (LOTRISONE) cream Apply 1 application topically 2 (two) times daily as needed.     Continuous Blood Gluc Receiver (FREESTYLE LIBRE 2 READER) DEVI Use to check sugar at least TID 1 each 1   Continuous Blood Gluc Sensor (FREESTYLE LIBRE 2 SENSOR) MISC 1 application  by Does not apply route every 14 (fourteen) days. 2 each 0   cyanocobalamin (,VITAMIN B-12,) 1000 MCG/ML injection Inject 1 mL (1,000 mcg total) into the muscle every 30 (thirty) days. Inject 1 ml (1000 mcg ) IM weekly x 4,  Then monthly thereafter 3 mL 1   dapagliflozin propanediol (FARXIGA) 10 MG TABS tablet Take 1 tablet (10 mg total) by mouth daily. 90 tablet 3   diphenoxylate-atropine (LOMOTIL) 2.5-0.025 MG tablet Take 1 tablet by mouth 4 (four) times daily as needed for diarrhea or loose stools. 60 tablet 5   erythromycin ophthalmic ointment 1 application at bedtime.     insulin aspart (NOVOLOG FLEXPEN) 100 UNIT/ML FlexPen Inject 10-20 units up to three times daily before meals; If pre-meal  glucose 100-150 - inject 10 units, 151-200 - inject 12 units; 201-250 - inject 14 units; 251-300 - inject 16 units, 301-350 - inject 18 units ; 350 > inject 20  units 54 mL 3   insulin degludec (TRESIBA FLEXTOUCH) 100 UNIT/ML FlexTouch Pen Inject 15 Units into the skin daily. 15 mL 3   Insulin Pen Needle (DROPLET PEN NEEDLES) 31G X 5 MM MISC USE ONE NEEDLE SUBCUTANEOUSLY AS DIRECTED (REMOVE AND DISCARD NEEDLE IN SHARPS CONTAINER IMMEDIATELY AFTER USE) 100 each 1   NONFORMULARY OR COMPOUNDED ITEM [facility]  Combination Pain Cream -  Baclofen 2%, Doxepin 5%, Gabapentin 6%, Topiramate 2%, Pentoxifylline 3% Apply 1-2 grams to affected area 3-4 times daily Qty. 120 gm 3 refills 1 each 3   nystatin (MYCOSTATIN/NYSTOP) powder Apply 1 g topically 4 (four) times daily as needed (rash).     ondansetron (ZOFRAN-ODT) 4 MG disintegrating tablet DISSOLVE ONE TABLET ON TONGUE EVERY 8 HOURS AS NEEDED FOR NAUSEA OR VOMITING 30 tablet 0   Polyethyl Glycol-Propyl Glycol (SYSTANE) 0.4-0.3 % GEL ophthalmic gel Place 1 application into both eyes.     potassium chloride 20 MEQ/15ML (10%) SOLN Take 15 mLs (20 mEq total) by mouth 3 (three) times daily. 473 mL 0   pregabalin (LYRICA) 50 MG capsule Take 1 capsule (50 mg total) by mouth 2 (two) times daily. 60 capsule 3   Semaglutide, 1 MG/DOSE, (OZEMPIC, 1 MG/DOSE,) 4 MG/3ML SOPN Inject 1 mg into the skin once a week. 9 mL 1   spironolactone (ALDACTONE) 25 MG tablet Take 0.5 tablets (12.5 mg total) by mouth daily. 15 tablet 3   torsemide (DEMADEX) 20 MG tablet Take 1 tablet (20 mg total) by mouth daily. 30 tablet 2   valACYclovir (VALTREX) 500 MG tablet Take 500 mg by mouth 2 (two) times daily.     amoxicillin (AMOXIL) 500 MG capsule 4 capsules 1-2 hours pre procedure (Patient not taking: No sig reported) 4 capsule 1   fexofenadine (ALLEGRA) 180 MG tablet Take 180 mg by mouth daily. (Patient not taking: No sig reported)     Lidocaine 4 % PTCH Apply topically daily.  (Patient not taking: No sig reported)     nitroGLYCERIN (NITROSTAT) 0.4 MG SL tablet Place 1 tablet (0.4 mg total) under the tongue every 5 (five) minutes as needed for chest pain. (Patient not taking: No sig reported) 25 tablet 1   No current facility-administered medications for this visit.    Allergies as of 05/10/2021 - Review Complete 05/10/2021  Allergen Reaction Noted   Demeclocycline Hives 09/18/2013   Erythromycin Nausea And Vomiting and Other (See Comments) 08/17/2011   Flagyl [metronidazole] Nausea And Vomiting and Other (See Comments) 08/17/2011   Glucophage [metformin hcl] Nausea And  Vomiting and Other (See Comments) 08/17/2011   Tetracyclines & related Hives and Rash 03/12/2011   Diovan [valsartan] Nausea Only 08/17/2011   Sulfa antibiotics Rash and Other (See Comments) 09/18/2013   Xanax [alprazolam] Other (See Comments) 08/17/2011    ROS:  General: Negative for anorexia, weight loss, fever, chills, fatigue, weakness. ENT: Negative for hoarseness, difficulty swallowing , nasal congestion. CV: Negative for chest pain, angina, palpitations, dyspnea on exertion, peripheral edema.  Respiratory: Negative for dyspnea at rest, dyspnea on exertion, cough, sputum, wheezing.  GI: See history of present illness. GU:  Negative for dysuria, hematuria, urinary incontinence, urinary frequency, nocturnal urination.  Endo: Negative for unusual weight change.    Physical Examination:   BP 118/72 (BP Location: Right Arm, Patient Position: Sitting, Cuff Size: Normal)   Pulse 70   Temp (!) 97.2 F (36.2 C) (Temporal)   Ht 5' 4.5" (1.638 m)   Wt 245 lb 6.4 oz (111.3 kg)   BMI 41.47 kg/m   General: Well-nourished, well-developed in no acute distress.  Eyes: No icterus. Conjunctivae pink. Neuro: Alert and oriented x 3.  Grossly intact. Skin: Warm and dry, no jaundice.   Psych: Alert and cooperative, normal mood and affect.  Labs:    Imaging Studies: No results  found.  Assessment and Plan:   Denise Sharp is a 75 y.o. y/o female who comes in today with a history of dysphagia and esophageal stricture seen on imaging.  The patient could not have a upper endoscopy previously due to her being on Plavix.  The patient has now been off Plavix and is ready to be set up for the upper endoscopy.  The patient will be set up for an EGD.  The patient has been explained the plan and agrees with it.     [name], MD. Marval Regal    Note: This dictation was prepared with Dragon dictation along with smaller phrase technology. Any transcriptional errors that result from this process are unintentional.

## 2021-05-11 ENCOUNTER — Other Ambulatory Visit: Payer: Medicare Other

## 2021-05-11 ENCOUNTER — Encounter
Admission: RE | Disposition: A | Discharge status: Home / Self Care | Payer: Self-pay | Source: Home / Self Care | Attending: Gastroenterology

## 2021-05-11 ENCOUNTER — Encounter: Payer: Self-pay | Admitting: Gastroenterology

## 2021-05-11 ENCOUNTER — Ambulatory Visit: Payer: Medicare Other | Admitting: Certified Registered"

## 2021-05-11 ENCOUNTER — Ambulatory Visit
Admission: RE | Admit: 2021-05-11 | Discharge: 2021-05-11 | Disposition: A | Discharge status: Home / Self Care | Payer: Medicare Other | Attending: Gastroenterology | Admitting: Gastroenterology

## 2021-05-11 ENCOUNTER — Other Ambulatory Visit: Payer: Self-pay

## 2021-05-11 DIAGNOSIS — Z794 Long term (current) use of insulin: Secondary | ICD-10-CM | POA: Diagnosis not present

## 2021-05-11 DIAGNOSIS — I2510 Atherosclerotic heart disease of native coronary artery without angina pectoris: Secondary | ICD-10-CM | POA: Insufficient documentation

## 2021-05-11 DIAGNOSIS — K222 Esophageal obstruction: Secondary | ICD-10-CM | POA: Diagnosis not present

## 2021-05-11 DIAGNOSIS — I110 Hypertensive heart disease with heart failure: Secondary | ICD-10-CM | POA: Insufficient documentation

## 2021-05-11 DIAGNOSIS — I5032 Chronic diastolic (congestive) heart failure: Secondary | ICD-10-CM | POA: Diagnosis not present

## 2021-05-11 DIAGNOSIS — E669 Obesity, unspecified: Secondary | ICD-10-CM | POA: Diagnosis not present

## 2021-05-11 DIAGNOSIS — G4733 Obstructive sleep apnea (adult) (pediatric): Secondary | ICD-10-CM | POA: Insufficient documentation

## 2021-05-11 DIAGNOSIS — Z7982 Long term (current) use of aspirin: Secondary | ICD-10-CM | POA: Insufficient documentation

## 2021-05-11 DIAGNOSIS — K589 Irritable bowel syndrome without diarrhea: Secondary | ICD-10-CM | POA: Insufficient documentation

## 2021-05-11 DIAGNOSIS — Z9884 Bariatric surgery status: Secondary | ICD-10-CM | POA: Insufficient documentation

## 2021-05-11 DIAGNOSIS — Z955 Presence of coronary angioplasty implant and graft: Secondary | ICD-10-CM | POA: Diagnosis not present

## 2021-05-11 DIAGNOSIS — Z6841 Body Mass Index (BMI) 40.0 and over, adult: Secondary | ICD-10-CM | POA: Diagnosis not present

## 2021-05-11 DIAGNOSIS — R1319 Other dysphagia: Secondary | ICD-10-CM | POA: Diagnosis not present

## 2021-05-11 DIAGNOSIS — E1142 Type 2 diabetes mellitus with diabetic polyneuropathy: Secondary | ICD-10-CM | POA: Diagnosis not present

## 2021-05-11 DIAGNOSIS — M1990 Unspecified osteoarthritis, unspecified site: Secondary | ICD-10-CM | POA: Insufficient documentation

## 2021-05-11 HISTORY — PX: ESOPHAGOGASTRODUODENOSCOPY (EGD) WITH PROPOFOL: SHX5813

## 2021-05-11 LAB — GLUCOSE, CAPILLARY
Glucose-Capillary: 67 mg/dL — ABNORMAL LOW (ref 70–99)
Glucose-Capillary: 100 mg/dL — ABNORMAL HIGH (ref 70–99)

## 2021-05-11 SURGERY — ESOPHAGOGASTRODUODENOSCOPY (EGD) WITH PROPOFOL
Anesthesia: General

## 2021-05-11 MED ORDER — DEXTROSE 50 % IV SOLN: 12.5000 g | Freq: Once | INTRAVENOUS | Status: AC

## 2021-05-11 MED ORDER — PROPOFOL 10 MG/ML IV BOLUS
INTRAVENOUS | Status: DC | PRN
  Administered 2021-05-11: 70 mg via INTRAVENOUS

## 2021-05-11 MED ORDER — DEXTROSE 50 % IV SOLN
INTRAVENOUS | Status: AC
  Administered 2021-05-11: 12.5 g via INTRAVENOUS
  Filled 2021-05-11: qty 50

## 2021-05-11 MED ORDER — SODIUM CHLORIDE 0.9 % IV SOLN: INTRAVENOUS | Status: DC

## 2021-05-11 MED ORDER — LIDOCAINE 2% (20 MG/ML) 5 ML SYRINGE
INTRAMUSCULAR | Status: DC | PRN
  Administered 2021-05-11: 25 mg via INTRAVENOUS

## 2021-05-11 MED ORDER — PROPOFOL 500 MG/50ML IV EMUL
INTRAVENOUS | Status: DC | PRN
  Administered 2021-05-11: 120 ug/kg/min via INTRAVENOUS

## 2021-05-11 NOTE — Anesthesia Postprocedure Evaluation (Signed)
Anesthesia Post Note  Patient: Denise Sharp  Procedure(s) Performed: ESOPHAGOGASTRODUODENOSCOPY (EGD) WITH PROPOFOL  Patient location during evaluation: PACU Anesthesia Type: General Level of consciousness: awake and awake and alert Pain management: pain level controlled Vital Signs Assessment: post-procedure vital signs reviewed and stable Respiratory status: spontaneous breathing and respiratory function stable Cardiovascular status: blood pressure returned to baseline Anesthetic complications: no   No notable events documented.   Last Vitals:  Vitals:   05/11/21 1120 05/11/21 1234  BP: (!) 106/49   Pulse: 71   Resp: 18   Temp: 36.6 C (!) 35.6 C  SpO2: 100%     Last Pain:  Vitals:   05/11/21 1254  TempSrc:   PainSc: 0-No pain                 VAN STAVEREN,Adena Sima

## 2021-05-11 NOTE — Transfer of Care (Signed)
Immediate Anesthesia Transfer of Care Note  Patient: Denise Sharp  Procedure(s) Performed: ESOPHAGOGASTRODUODENOSCOPY (EGD) WITH PROPOFOL  Patient Location: Endoscopy Unit  Anesthesia Type:General  Level of Consciousness: awake  Airway & Oxygen Therapy: Patient Spontanous Breathing  Post-op Assessment: Report given to RN and Post -op Vital signs reviewed and stable  Post vital signs: Reviewed  Last Vitals:  Vitals Value Taken Time  BP 110/54 05/11/21 1230  Temp    Pulse 72 05/11/21 1230  Resp 13 05/11/21 1230  SpO2 100 % 05/11/21 1230  Vitals shown include unvalidated device data.  Last Pain:  Vitals:   05/11/21 1229  TempSrc:   PainSc: 0-No pain         Complications: No notable events documented.

## 2021-05-11 NOTE — Op Note (Addendum)
[facility] Gastroenterology Patient Name: Denise Sharp Procedure Date: 05/11/2021 12:11 PM MRN: [id number] Account #: [id number] Date of Birth: 11/25/1945 Admit Type: Outpatient Age: 75 Room: [facility] ENDO ROOM 4 Gender: Female Note Status: Supervisor Override Instrument Name: Altamese Cabal Endoscope [id number] Procedure:             Upper GI endoscopy Indications:           Dysphagia Providers:             [name] MD, MD Referring MD:          Deborra Medina, MD (Referring MD) Medicines:             Propofol per Anesthesia Complications:         No immediate complications. Procedure:             Pre-Anesthesia Assessment:                        - Prior to the procedure, a History and Physical was                         performed, and patient medications and allergies were                         reviewed. The patient's tolerance of previous                         anesthesia was also reviewed. The risks and benefits                         of the procedure and the sedation options and risks                         were discussed with the patient. All questions were                         answered, and informed consent was obtained. Prior                         Anticoagulants: The patient has taken no previous                         anticoagulant or antiplatelet agents. ASA Grade                         Assessment: II - A patient with mild systemic disease.                         After reviewing the risks and benefits, the patient                         was deemed in satisfactory condition to undergo the                         procedure.                        After obtaining informed consent, the endoscope was  passed under direct vision. Throughout the procedure,                         the patient's blood pressure, pulse, and oxygen                         saturations were monitored continuously. The Endoscope                         was  introduced through the mouth, and advanced to the                         jejunum. The upper GI endoscopy was accomplished                         without difficulty. The patient tolerated the                         procedure well. Findings:      One benign-appearing, intrinsic moderate stenosis was found at the       gastroesophageal junction. The stenosis was traversed. A TTS dilator was       passed through the scope. Dilation with a 15-16.5-18 mm balloon dilator       was performed to 16.5 mm. The dilation site was examined following       endoscope reinsertion and showed moderate improvement in luminal       narrowing.      Evidence of a Roux-en-Y gastrojejunostomy was found. The gastrojejunal       anastomosis was characterized by healthy appearing mucosa. This was       traversed. Impression:            - Benign-appearing esophageal stenosis. Dilated.                        - Roux-en-Y gastrojejunostomy with gastrojejunal                         anastomosis characterized by healthy appearing mucosa.                        - No specimens collected. Recommendation:        - Discharge patient to home.                        - Resume previous diet.                        - Continue present medications.                        - Repeat upper endoscopy in 4 weeks for retreatment. Procedure Code(s):     --- Professional ---                        [phone], Esophagogastroduodenoscopy, flexible,                         transoral; with transendoscopic balloon dilation of  esophagus (less than 30 mm diameter) CPT copyright 2019 [employer]. All rights reserved. The codes documented in this report are preliminary and upon coder review may  be revised to meet current compliance requirements. [name] MD, MD 05/11/2021 12:28:50 PM This report has been signed electronically. Number of Addenda: 0 Note Initiated On: 05/11/2021 12:11 PM Estimated Blood  Loss:  Estimated blood loss: none.      [facility]

## 2021-05-11 NOTE — Anesthesia Preprocedure Evaluation (Signed)
Anesthesia Evaluation  Patient identified by MRN, date of birth, ID band Patient awake    Airway Mallampati: II       Dental  (+) Upper Dentures, Lower Dentures   Pulmonary sleep apnea and Continuous Positive Airway Pressure Ventilation ,     + decreased breath sounds      Cardiovascular Exercise Tolerance: Poor hypertension, Pt. on medications + angina with exertion + CAD and + DOE  + dysrhythmias  Rhythm:Irregular     Neuro/Psych  Headaches, Anxiety Depression    GI/Hepatic negative GI ROS, Neg liver ROS,   Endo/Other  diabetes, Poorly Controlled, Type 1  Renal/GU   negative genitourinary   Musculoskeletal   Abdominal (+) + obese,   Peds negative pediatric ROS (+)  Hematology  (+) anemia ,   Anesthesia Other Findings   Reproductive/Obstetrics negative OB ROS                             Anesthesia Physical Anesthesia Plan  ASA: 4  Anesthesia Plan: General   Post-op Pain Management:    Induction:   PONV Risk Score and Plan:   Airway Management Planned: Nasal Cannula  Additional Equipment:   Intra-op Plan:   Post-operative Plan:   Informed Consent: I have reviewed the patients History and Physical, chart, labs and discussed the procedure including the risks, benefits and alternatives for the proposed anesthesia with the patient or authorized representative who has indicated his/her understanding and acceptance.       Plan Discussed with: CRNA and Surgeon  Anesthesia Plan Comments:         Anesthesia Quick Evaluation

## 2021-05-11 NOTE — Interval H&P Note (Signed)
Denise Lame, MD [facility] [address] [place], [place] [address] Phone:[phone] Fax : [phone]  Primary Care Physician:  [name], MD Primary Gastroenterologist:  Dr. Allen Norris  Pre-Procedure History & Physical: HPI:  Denise Sharp is a 75 y.o. female is here for an endoscopy.   Past Medical History:  Diagnosis Date   (HFpEF) heart failure with preserved ejection fraction ([place])    a. 12/2019 Echo: EF 65-70%, Gr2 DD. No significant valvular dzs.   Abnormality of gait 03/25/2013   Adrenal mass, left ([place])    Anemia    iron deficient post  2 unit txfsn 2009, normal endo/colonoscopy by Penny Arrambide   Arthritis    CAD (coronary artery disease) with h/o Atypical Chest Pain    a. 04/1986 Cath ([facility]): nl cors, EF 65%; b. 07/2012 Cath [facility]): Diff minor irregs; c. 07/2016 MV [facility]): "Equivocal"; d. 08/2016 Cardiac CT Ca2+ score [facility]): Ca2+ 5956; e. 05/2019 MV: No ischemia. EF 75%; f. 12/2019 PCI: LM nl, LAD 65ost (3.5x12 Resolute DES), 101m (2.75x12 Resolute DES),LCX/RCA nl; g. 08/2020 Cath: patent LAD stents, RCA 40ost, elev RH pressures, EF >65%.   Cervical spinal stenosis 1994   due to trauma to back ([employer] accident), has intermittent paralysis and parasthesias   Cervicogenic headache 03/23/2014   Depression    Dizziness    chronic dizziness   DJD (degenerative joint disease)    a. Chronic R shoulder pain pending R shoulder replacement 07/2017.   Esophageal stenosis March 2011   with transietn outlet obstruction by food, cleared by EGD    Family history of adverse reaction to anesthesia    daughter PONV   Gastric bypass status for obesity    Headache(784.0)    Hypertension    IBS (irritable bowel syndrome)    Left bundle branch block (LBBB)    a. Intermittently present - likely rate related. - patient denies   Obesity    Obstructive sleep apnea    using CPAP   Polyneuropathy in diabetes(357.2) 03/25/2013   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/09/2021   Right V1 distribution    Restless leg syndrome    Rotator cuff arthropathy, right 08/13/2017   Syncope and collapse 03/12/2014   Type II diabetes mellitus ([place])     Past Surgical History:  Procedure Laterality Date   ABDOMINAL HYSTERECTOMY     APPENDECTOMY     CARDIAC CATHETERIZATION     COLONOSCOPY     CORONARY STENT INTERVENTION N/A 01/04/2020   Procedure: CORONARY STENT INTERVENTION;  Surgeon: [name], MD;  Location: [facility];  Service: Cardiovascular;  Laterality: N/A;  LAD    DIAPHRAGMATIC HERNIA REPAIR  2015   ESOPHAGEAL DILATION     multiple   ESOPHAGOGASTRODUODENOSCOPY     GALLBLADDER SURGERY  1976   GALLBLADDER SURGERY  resection   GASTRIC BYPASS     GASTRIC BYPASS  2000, 2005   Dr. [name] IMPLANT PLACEMENT  April 2013   [place] REPLACEMENT  2007   bilateral knee. [name],  [name]   LEFT HEART CATH AND CORS/GRAFTS ANGIOGRAPHY N/A 12/31/2019   Procedure: LEFT HEART CATH AND CORS/GRAFTS ANGIOGRAPHY;  Surgeon: [name], MD;  Location: [facility];  Service: Cardiovascular;  Laterality: N/A;   PANNICULECTOMY  06/16/2019   PANNICULECTOMY N/A 06/16/2019   Procedure: PANNICULECTOMY;  Surgeon: [name], MD;  Location: [facility];  Service: Plastics;  Laterality: N/A;  3 hours, please   REVERSE SHOULDER  ARTHROPLASTY Right 08/13/2017   Procedure: REVERSE RIGHT SHOULDER ARTHROPLASTY;  Surgeon: [name], MD;  Location: [facility];  Service: Orthopedics;  Laterality: Right;   RIGHT/LEFT HEART CATH AND CORONARY ANGIOGRAPHY N/A 09/06/2020   Procedure: RIGHT/LEFT HEART CATH AND CORONARY ANGIOGRAPHY;  Surgeon: [name], MD;  Location: [facility];  Service: Cardiovascular;  Laterality: N/A;   ROTATOR CUFF REPAIR     right   SPINE SURGERY  1995   [place]   TOTAL ABDOMINAL HYSTERECTOMY W/ BILATERAL SALPINGOOPHORECTOMY  6599   UMBILICAL HERNIA REPAIR  Aug 11, 2015    Prior to Admission medications   Medication Sig Start Date End Date  Taking? Authorizing Provider  aspirin EC 81 MG tablet Take 1 tablet (81 mg total) by mouth daily. 01/05/20  Yes Fritzi Mandes, MD  atorvastatin (LIPITOR) 40 MG tablet Take 1 tablet (40 mg total) by mouth daily. 02/17/21  Yes [name], MD  dapagliflozin propanediol (FARXIGA) 10 MG TABS tablet Take 1 tablet (10 mg total) by mouth daily. 03/21/21  Yes [name], MD  insulin aspart (NOVOLOG FLEXPEN) 100 UNIT/ML FlexPen Inject 10-20 units up to three times daily before meals; If pre-meal glucose 100-150 - inject 10 units, 151-200 - inject 12 units; 201-250 - inject 14 units; 251-300 - inject 16 units, 301-350 - inject 18 units ; 350 > inject 20  units 05/08/21  Yes [name], MD  pregabalin (LYRICA) 50 MG capsule Take 1 capsule (50 mg total) by mouth 2 (two) times daily. 03/09/21  Yes Kathrynn Ducking, MD  amoxicillin (AMOXIL) 500 MG capsule 4 capsules 1-2 hours pre procedure Patient not taking: No sig reported 04/13/21   [name], MD  clotrimazole-betamethasone (LOTRISONE) cream Apply 1 application topically 2 (two) times daily as needed.    [provider]  Continuous Blood Gluc Receiver (FREESTYLE LIBRE 2 READER) DEVI Use to check sugar at least TID 09/26/20   [name], MD  Continuous Blood Gluc Sensor (FREESTYLE LIBRE 2 SENSOR) MISC 1 application by Does not apply route every 14 (fourteen) days. 02/17/21   [name], MD  cyanocobalamin (,VITAMIN B-12,) 1000 MCG/ML injection Inject 1 mL (1,000 mcg total) into the muscle every 30 (thirty) days. Inject 1 ml (1000 mcg ) IM weekly x 4,  Then monthly thereafter 02/17/21   [name], MD  diphenoxylate-atropine (LOMOTIL) 2.5-0.025 MG tablet Take 1 tablet by mouth 4 (four) times daily as needed for diarrhea or loose stools. 02/17/21   [name], MD  erythromycin ophthalmic ointment 1 application at bedtime.    [provider]  fexofenadine (ALLEGRA) 180 MG tablet Take 180 mg by mouth daily. Patient not  taking: No sig reported    [provider]  insulin degludec (TRESIBA FLEXTOUCH) 100 UNIT/ML FlexTouch Pen Inject 15 Units into the skin daily. 05/08/21   [name], MD  Insulin Pen Needle (DROPLET PEN NEEDLES) 31G X 5 MM MISC USE ONE NEEDLE SUBCUTANEOUSLY AS DIRECTED (REMOVE AND DISCARD NEEDLE IN SHARPS CONTAINER IMMEDIATELY AFTER USE) 02/17/21   [name], MD  Lidocaine 4 % PTCH Apply topically daily. Patient not taking: No sig reported    [provider]  nitroGLYCERIN (NITROSTAT) 0.4 MG SL tablet Place 1 tablet (0.4 mg total) under the tongue every 5 (five) minutes as needed for chest pain. Patient not taking: No sig reported 09/23/20   Theora Gianotti, NP  NONFORMULARY OR COMPOUNDED [place]  Combination Pain Cream -  Baclofen 2%, Doxepin 5%, Gabapentin 6%, Topiramate 2%, Pentoxifylline 3% Apply 1-2 grams to affected area 3-4 times daily Qty. 120 gm 3 refills 10/14/17   Gardiner Barefoot, DPM  nystatin (MYCOSTATIN/NYSTOP) powder Apply 1 g topically 4 (four) times daily as needed (rash).    [provider]  ondansetron (ZOFRAN-ODT) 4 MG disintegrating tablet DISSOLVE ONE TABLET ON TONGUE EVERY 8 HOURS AS NEEDED FOR NAUSEA OR VOMITING 02/24/21   [name], MD  Polyethyl Glycol-Propyl Glycol (SYSTANE) 0.4-0.3 % GEL ophthalmic gel Place 1 application into both eyes.    [provider]  potassium chloride 20 MEQ/15ML (10%) SOLN Take 15 mLs (20 mEq total) by mouth 3 (three) times daily. 05/08/21   End, Harrell Gave, MD  Semaglutide, 1 MG/DOSE, (OZEMPIC, 1 MG/DOSE,) 4 MG/3ML SOPN Inject 1 mg into the skin once a week. 05/08/21   [name], MD  spironolactone (ALDACTONE) 25 MG tablet Take 0.5 tablets (12.5 mg total) by mouth daily. 05/08/21 08/06/21  End, Harrell Gave, MD  torsemide (DEMADEX) 20 MG tablet Take 1 tablet (20 mg total) by mouth daily. 05/03/21 08/01/21  End, Harrell Gave, MD  valACYclovir (VALTREX) 500 MG tablet Take  500 mg by mouth 2 (two) times daily. 03/15/21   [provider]    Allergies as of 05/10/2021 - Review Complete 05/10/2021  Allergen Reaction Noted   Demeclocycline Hives 09/18/2013   Erythromycin Nausea And Vomiting and Other (See Comments) 08/17/2011   Flagyl [metronidazole] Nausea And Vomiting and Other (See Comments) 08/17/2011   Glucophage [metformin hcl] Nausea And Vomiting and Other (See Comments) 08/17/2011   Tetracyclines & related Hives and Rash 03/12/2011   Diovan [valsartan] Nausea Only 08/17/2011   Sulfa antibiotics Rash and Other (See Comments) 09/18/2013   Xanax [alprazolam] Other (See Comments) 08/17/2011    Family History  Problem Relation Age of Onset   Heart disease Father    Hypertension Father    Prostate cancer Father    Stroke Father    Osteoporosis Father    Stroke Mother    Depression Mother    Headache Mother    Heart disease Mother    Thyroid disease Mother    Hypertension Mother    Diabetes Daughter    Heart disease Daughter    Hypertension Daughter    Hypertension Son     Social History   Socioeconomic History   Marital status: Married    Spouse name: [name]    Number of children: 2   Years of education: College    Highest education level: Not on file  Occupational History    Employer: retired  Tobacco Use   Smoking status: Never   Smokeless tobacco: Never  Vaping Use   Vaping Use: Never used  Substance and Sexual Activity   Alcohol use: No   Drug use: Yes    Comment: prescribed valium   Sexual activity: Not Currently    Birth control/protection: Post-menopausal, Surgical  Other Topics Concern   Not on file  Social History Narrative   Patient lives at home with husband [name].    Right Handed   Drinks caffeinated tea occasionally   Social Determinants of Health   Financial Resource Strain: Low Risk    Difficulty of Paying Living Expenses: Not hard at all  Food Insecurity: No Food Insecurity   Worried About Ship broker in the Last Year: Never true   Ran Out of Food in the Last Year: Never true  Transportation Needs: No Data processing manager (Medical): No   Lack of Transportation (Non-Medical): No  Physical Activity: Insufficiently Active   Days of Exercise per Week: 2 days   Minutes of Exercise per Session: 40 min  Stress: Stress Concern Present   Feeling of Stress : Rather much  Social Connections: Not on file  Intimate Partner Violence: Not At Risk   Fear of Current or Ex-Partner: No   Emotionally Abused: No   Physically Abused: No   Sexually Abused: No    Review of Systems: See HPI, otherwise negative ROS  Physical Exam: BP (!) 106/49   Pulse 71   Temp 97.9 F (36.6 C) (Temporal)   Resp 18   Ht 5' 4.5" (1.638 m)   Wt 111.3 kg   SpO2 100%   BMI 41.47 kg/m  General:   Alert,  pleasant and cooperative in NAD Head:  Normocephalic and atraumatic. Neck:  Supple; no masses or thyromegaly. Lungs:  Clear throughout to auscultation.    Heart:  Regular rate and rhythm. Abdomen:  Soft, nontender and nondistended. Normal bowel sounds, without guarding, and without rebound.   Neurologic:  Alert and  oriented x4;  grossly normal neurologically.  Impression/Plan: Denise Sharp is here for an endoscopy to be performed for dysphagia  Risks, benefits, limitations, and alternatives regarding  endoscopy have been reviewed with the patient.  Questions have been answered.  All parties agreeable.   Denise Lame, MD  05/11/2021, 12:13 PM

## 2021-05-12 ENCOUNTER — Other Ambulatory Visit (INDEPENDENT_AMBULATORY_CARE_PROVIDER_SITE_OTHER): Payer: Medicare Other

## 2021-05-12 DIAGNOSIS — E876 Hypokalemia: Secondary | ICD-10-CM

## 2021-05-12 DIAGNOSIS — Z79899 Other long term (current) drug therapy: Secondary | ICD-10-CM | POA: Diagnosis not present

## 2021-05-13 LAB — BASIC METABOLIC PANEL
BUN/Creatinine Ratio: 10 — ABNORMAL LOW (ref 12–28)
BUN: 13 mg/dL (ref 8–27)
CO2: 23 mmol/L (ref 20–29)
Calcium: 9.7 mg/dL (ref 8.7–10.3)
Chloride: 102 mmol/L (ref 96–106)
Creatinine, Ser: 1.26 mg/dL — ABNORMAL HIGH (ref 0.57–1.00)
Glucose: 103 mg/dL — ABNORMAL HIGH (ref 70–99)
Potassium: 4 mmol/L (ref 3.5–5.2)
Sodium: 145 mmol/L — ABNORMAL HIGH (ref 134–144)
eGFR: 45 mL/min/{1.73_m2} — ABNORMAL LOW (ref >59)

## 2021-05-15 DIAGNOSIS — E1142 Type 2 diabetes mellitus with diabetic polyneuropathy: Secondary | ICD-10-CM | POA: Diagnosis not present

## 2021-05-17 DIAGNOSIS — S8391XA Sprain of unspecified site of right knee, initial encounter: Secondary | ICD-10-CM | POA: Insufficient documentation

## 2021-05-17 DIAGNOSIS — S93401A Sprain of unspecified ligament of right ankle, initial encounter: Secondary | ICD-10-CM | POA: Insufficient documentation

## 2021-05-17 DIAGNOSIS — M25561 Pain in right knee: Secondary | ICD-10-CM | POA: Insufficient documentation

## 2021-05-24 ENCOUNTER — Telehealth: Payer: Self-pay | Admitting: Gastroenterology

## 2021-05-25 ENCOUNTER — Other Ambulatory Visit: Payer: Self-pay | Admitting: Gastroenterology

## 2021-05-25 ENCOUNTER — Encounter: Payer: Self-pay | Admitting: Gastroenterology

## 2021-05-25 ENCOUNTER — Ambulatory Visit (INDEPENDENT_AMBULATORY_CARE_PROVIDER_SITE_OTHER): Payer: Medicare Other | Admitting: Gastroenterology

## 2021-05-25 ENCOUNTER — Other Ambulatory Visit: Payer: Self-pay

## 2021-05-25 VITALS — BP 132/82 | HR 87 | Temp 97.0°F | Ht 64.5 in | Wt 239.8 lb

## 2021-05-25 DIAGNOSIS — Z20822 Contact with and (suspected) exposure to covid-19: Secondary | ICD-10-CM | POA: Diagnosis not present

## 2021-05-25 DIAGNOSIS — K625 Hemorrhage of anus and rectum: Secondary | ICD-10-CM | POA: Diagnosis not present

## 2021-05-25 DIAGNOSIS — I25119 Atherosclerotic heart disease of native coronary artery with unspecified angina pectoris: Secondary | ICD-10-CM

## 2021-05-25 MED ORDER — PANTOPRAZOLE SODIUM 40 MG PO TBEC: 40.0000 mg | DELAYED_RELEASE_TABLET | Freq: Every day | ORAL | 0 refills | Status: DC

## 2021-05-25 MED ORDER — HYDROCORTISONE ACETATE 25 MG RE SUPP
25.0000 mg | Freq: Every day | RECTAL | 1 refills | Status: DC
Start: 2021-05-25 — End: 2021-09-05

## 2021-05-25 NOTE — H&P (View-Only) (Signed)
Primary Care Physician: [name], MD  Primary Gastroenterologist:  Dr. [name]  Chief Complaint  Patient presents with   Rectal Bleeding    HPI: Denise Sharp is a 75 y.o. female here after having an upper endoscopy by me for dysphagia with a finding of an esophageal stricture that was treated with dilation. That procedure was done on October 27 of this year. The patient is status post gastric bypass surgery.  The patient had called my office yesterday reporting that she had a fall and after the fall started having blood from her rectum.  She had reported that the blood was running down her leg when she would stand up. The patient was using external creams but was hesitant to put anything in her rectum due to the bleeding.  She states that its been going on for a week.  It does not appear to be getting better.  Past Medical History:  Diagnosis Date   (HFpEF) heart failure with preserved ejection fraction ([place])    a. 12/2019 Echo: EF 65-70%, Gr2 DD. No significant valvular dzs.   Abnormality of gait 03/25/2013   Adrenal mass, left ([place])    Anemia    iron deficient post  2 unit txfsn 2009, normal endo/colonoscopy by [facility]   Arthritis    CAD (coronary artery disease) with h/o Atypical Chest Pain    a. 04/1986 Cath ([facility]): nl cors, EF 65%; b. 07/2012 Cath [facility]): Diff minor irregs; c. 07/2016 MV [facility]): "Equivocal"; d. 08/2016 Cardiac CT Ca2+ score [facility]): Ca2+ 4782; e. 05/2019 MV: No ischemia. EF 75%; f. 12/2019 PCI: LM nl, LAD 65ost (3.5x12 Resolute DES), 45m (2.75x12 Resolute DES),LCX/RCA nl; g. 08/2020 Cath: patent LAD stents, RCA 40ost, elev RH pressures, EF >65%.   Cervical spinal stenosis 1994   due to trauma to back ([employer] accident), has intermittent paralysis and parasthesias   Cervicogenic headache 03/23/2014   Depression    Dizziness    chronic dizziness   DJD (degenerative joint disease)    a. Chronic R shoulder pain pending R shoulder replacement 07/2017.    Esophageal stenosis March 2011   with transietn outlet obstruction by food, cleared by EGD    Family history of adverse reaction to anesthesia    daughter PONV   Gastric bypass status for obesity    Headache(784.0)    Hypertension    IBS (irritable bowel syndrome)    Left bundle branch block (LBBB)    a. Intermittently present - likely rate related. 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 30 tablet 2   atorvastatin (LIPITOR) 40 MG tablet Take 1 tablet (40 mg total) by mouth daily. 90 tablet 0   clotrimazole-betamethasone (LOTRISONE) cream Apply 1 application topically 2 (two) times daily as needed.     Continuous Blood Gluc Receiver (FREESTYLE LIBRE 2 READER) DEVI Use to check sugar at least TID 1 each 1   Continuous Blood Gluc Sensor (FREESTYLE LIBRE 2 SENSOR) MISC 1 application by Does not apply route every 14 (fourteen)  days. 2 each 0   cyanocobalamin (,VITAMIN B-12,) 1000 MCG/ML injection Inject 1 mL (1,000 mcg total) into the muscle every 30 (thirty) days. Inject 1 ml (1000 mcg ) IM weekly x 4,  Then monthly thereafter 3 mL 1   dapagliflozin propanediol (FARXIGA) 10 MG TABS tablet Take 1 tablet (10 mg total) by mouth daily. 90 tablet 3   diphenoxylate-atropine (LOMOTIL) 2.5-0.025 MG tablet Take 1 tablet by mouth 4 (four) times daily as needed for diarrhea or loose stools. 60 tablet 5   erythromycin ophthalmic ointment 1 application at bedtime.     hydrocortisone (ANUSOL-HC) 25 MG suppository Place 1 suppository (25 mg total) rectally at bedtime. 24 suppository 1   insulin aspart  (NOVOLOG FLEXPEN) 100 UNIT/ML FlexPen Inject 10-20 units up to three times daily before meals; If pre-meal glucose 100-150 - inject 10 units, 151-200 - inject 12 units; 201-250 - inject 14 units; 251-300 - inject 16 units, 301-350 - inject 18 units ; 350 > inject 20  units 54 mL 3   insulin degludec (TRESIBA FLEXTOUCH) 100 UNIT/ML FlexTouch Pen Inject 15 Units into the skin daily. 15 mL 3   Insulin Pen Needle (DROPLET PEN NEEDLES) 31G X 5 MM MISC USE ONE NEEDLE SUBCUTANEOUSLY AS DIRECTED (REMOVE AND DISCARD NEEDLE IN SHARPS CONTAINER IMMEDIATELY AFTER USE) 100 each 1   NONFORMULARY OR COMPOUNDED ITEM [facility]  Combination Pain Cream -  Baclofen 2%, Doxepin 5%, Gabapentin 6%, Topiramate 2%, Pentoxifylline 3% Apply 1-2 grams to affected area 3-4 times daily Qty. 120 gm 3 refills 1 each 3   nystatin (MYCOSTATIN/NYSTOP) powder Apply 1 g topically 4 (four) times daily as needed (rash).     ondansetron (ZOFRAN-ODT) 4 MG disintegrating tablet DISSOLVE ONE TABLET ON TONGUE EVERY 8 HOURS AS NEEDED FOR NAUSEA OR VOMITING 30 tablet 0   pantoprazole (PROTONIX) 40 MG tablet Take 1 tablet (40 mg total) by mouth daily. 30 tablet 0   Polyethyl Glycol-Propyl Glycol (SYSTANE) 0.4-0.3 % GEL ophthalmic gel Place 1 application into both eyes.     potassium chloride 20 MEQ/15ML (10%) SOLN Take 15 mLs (20 mEq total) by mouth 3 (three) times daily. 473 mL 0   pregabalin (LYRICA) 50 MG capsule Take 1 capsule (50 mg total) by mouth 2 (two) times daily. 60 capsule 3   Semaglutide, 1 MG/DOSE, (OZEMPIC, 1 MG/DOSE,) 4 MG/3ML SOPN Inject 1 mg into the skin once a week. 9 mL 1   spironolactone (ALDACTONE) 25 MG tablet Take 0.5 tablets (12.5 mg total) by mouth daily. 15 tablet 3   torsemide (DEMADEX) 20 MG tablet Take 1 tablet (20 mg total) by mouth daily. 30 tablet 2   valACYclovir (VALTREX) 500 MG tablet Take 500 mg by mouth 2 (two) times daily.     amoxicillin (AMOXIL) 500 MG capsule 4 capsules 1-2 hours pre procedure  (Patient not taking: No sig reported) 4 capsule 1   fexofenadine (ALLEGRA) 180 MG tablet Take 180 mg by mouth daily. (Patient not taking: No sig reported)     Lidocaine 4 % PTCH Apply topically daily. (Patient not taking: No sig reported)     nitroGLYCERIN (NITROSTAT) 0.4 MG SL tablet Place 1 tablet (0.4 mg total) under the tongue every 5 (five) minutes as needed for chest pain. (Patient not taking: No sig reported) 25 tablet 1   No current facility-administered medications for this visit.    Allergies as of 05/25/2021 - Review Complete 05/25/2021  Allergen Reaction Noted   Demeclocycline Hives 09/18/2013  Erythromycin Nausea And Vomiting and Other (See Comments) 08/17/2011   Flagyl [metronidazole] Nausea And Vomiting and Other (See Comments) 08/17/2011   Glucophage [metformin hcl] Nausea And Vomiting and Other (See Comments) 08/17/2011   Tetracyclines & related Hives and Rash 03/12/2011   Diovan [valsartan] Nausea Only 08/17/2011   Sulfa antibiotics Rash and Other (See Comments) 09/18/2013   Xanax [alprazolam] Other (See Comments) 08/17/2011    ROS:  General: Negative for anorexia, weight loss, fever, chills, fatigue, weakness. ENT: Negative for hoarseness, difficulty swallowing , nasal congestion. CV: Negative for chest pain, angina, palpitations, dyspnea on exertion, peripheral edema.  Respiratory: Negative for dyspnea at rest, dyspnea on exertion, cough, sputum, wheezing.  GI: See history of present illness. GU:  Negative for dysuria, hematuria, urinary incontinence, urinary frequency, nocturnal urination.  Endo: Negative for unusual weight change.    Physical Examination:   BP 132/82 (BP Location: Left Arm, Patient Position: Sitting, Cuff Size: Normal)   Pulse 87   Temp (!) 97 F (36.1 C) (Temporal)   Ht 5' 4.5" (1.638 m)   Wt 239 lb 12.8 oz (108.8 kg)   BMI 40.53 kg/m   General: Well-nourished, well-developed in no acute distress.  Eyes: No icterus. Conjunctivae  pink. Rectal : Obvious internal and external hemorrhoids with bleeding from a internal hemorrhoid in the anal canal. neuro: Alert and oriented x 3.  Grossly intact. Skin: Warm and dry, no jaundice.   Psych: Alert and cooperative, normal mood and affect.  Labs:    Imaging Studies: No results found.  Assessment and Plan:   Denise Sharp is a 75 y.o. y/o female with internal hemorrhoids actively oozing some blood.  The patient has been told to have a repeat EGD due to her esophageal stricture seen in the last EGD and will also be started on Anusol suppositories for her rectal bleeding due to her hemorrhoids.  The patient will also be started on Protonix due to her esophageal stricture.  The patient has been explained the plan and agrees with it.     [name], MD. Mar[facility] Regal    Note: This dictation was prepared with Dragon dictation along with smaller phrase technology. Any transcriptional errors that result from this process are unintentional.

## 2021-05-25 NOTE — Telephone Encounter (Signed)
Pt scheduled for an urgent office appt on 05/25/21.

## 2021-05-25 NOTE — Progress Notes (Signed)
Primary Care Physician: [name], MD  Primary Gastroenterologist:  Dr. [name]  Chief Complaint  Patient presents with   Rectal Bleeding    HPI: Denise Sharp is a 75 y.o. female here after having an upper endoscopy by me for dysphagia with a finding of an esophageal stricture that was treated with dilation. That procedure was done on October 27 of this year. The patient is status post gastric bypass surgery.  The patient had called my office yesterday reporting that she had a fall and after the fall started having blood from her rectum.  She had reported that the blood was running down her leg when she would stand up. The patient was using external creams but was hesitant to put anything in her rectum due to the bleeding.  She states that its been going on for a week.  It does not appear to be getting better.  Past Medical History:  Diagnosis Date   (HFpEF) heart failure with preserved ejection fraction ([place])    a. 12/2019 Echo: EF 65-70%, Gr2 DD. No significant valvular dzs.   Abnormality of gait 03/25/2013   Adrenal mass, left ([place])    Anemia    iron deficient post  2 unit txfsn 2009, normal endo/colonoscopy by [facility] Frater   Arthritis    CAD (coronary artery disease) with h/o Atypical Chest Pain    a. 04/1986 Cath ([facility]): nl cors, EF 65%; b. 07/2012 Cath [facility]): Diff minor irregs; c. 07/2016 MV [facility]): "Equivocal"; d. 08/2016 Cardiac CT Ca2+ score [facility]): Ca2+ 3846; e. 05/2019 MV: No ischemia. EF 75%; f. 12/2019 PCI: LM nl, LAD 65ost (3.5x12 Resolute DES), 34m (2.75x12 Resolute DES),LCX/RCA nl; g. 08/2020 Cath: patent LAD stents, RCA 40ost, elev RH pressures, EF >65%.   Cervical spinal stenosis 1994   due to trauma to back ([employer] accident), has intermittent paralysis and parasthesias   Cervicogenic headache 03/23/2014   Depression    Dizziness    chronic dizziness   DJD (degenerative joint disease)    a. Chronic R shoulder pain pending R shoulder replacement 07/2017.    Esophageal stenosis March 2011   with transietn outlet obstruction by food, cleared by EGD    Family history of adverse reaction to anesthesia    daughter PONV   Gastric bypass status for obesity    Headache(784.0)    Hypertension    IBS (irritable bowel syndrome)    Left bundle branch block (LBBB)    a. Intermittently present - likely rate related. 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 30 tablet 2   atorvastatin (LIPITOR) 40 MG tablet Take 1 tablet (40 mg total) by mouth daily. 90 tablet 0   clotrimazole-betamethasone (LOTRISONE) cream Apply 1 application topically 2 (two) times daily as needed.     Continuous Blood Gluc Receiver (FREESTYLE LIBRE 2 READER) DEVI Use to check sugar at least TID 1 each 1   Continuous Blood Gluc Sensor (FREESTYLE LIBRE 2 SENSOR) MISC 1 application by Does not apply route every 14 (fourteen)  days. 2 each 0   cyanocobalamin (,VITAMIN B-12,) 1000 MCG/ML injection Inject 1 mL (1,000 mcg total) into the muscle every 30 (thirty) days. Inject 1 ml (1000 mcg ) IM weekly x 4,  Then monthly thereafter 3 mL 1   dapagliflozin propanediol (FARXIGA) 10 MG TABS tablet Take 1 tablet (10 mg total) by mouth daily. 90 tablet 3   diphenoxylate-atropine (LOMOTIL) 2.5-0.025 MG tablet Take 1 tablet by mouth 4 (four) times daily as needed for diarrhea or loose stools. 60 tablet 5   erythromycin ophthalmic ointment 1 application at bedtime.     hydrocortisone (ANUSOL-HC) 25 MG suppository Place 1 suppository (25 mg total) rectally at bedtime. 24 suppository 1   insulin aspart  (NOVOLOG FLEXPEN) 100 UNIT/ML FlexPen Inject 10-20 units up to three times daily before meals; If pre-meal glucose 100-150 - inject 10 units, 151-200 - inject 12 units; 201-250 - inject 14 units; 251-300 - inject 16 units, 301-350 - inject 18 units ; 350 > inject 20  units 54 mL 3   insulin degludec (TRESIBA FLEXTOUCH) 100 UNIT/ML FlexTouch Pen Inject 15 Units into the skin daily. 15 mL 3   Insulin Pen Needle (DROPLET PEN NEEDLES) 31G X 5 MM MISC USE ONE NEEDLE SUBCUTANEOUSLY AS DIRECTED (REMOVE AND DISCARD NEEDLE IN SHARPS CONTAINER IMMEDIATELY AFTER USE) 100 each 1   NONFORMULARY OR COMPOUNDED ITEM [facility]  Combination Pain Cream -  Baclofen 2%, Doxepin 5%, Gabapentin 6%, Topiramate 2%, Pentoxifylline 3% Apply 1-2 grams to affected area 3-4 times daily Qty. 120 gm 3 refills 1 each 3   nystatin (MYCOSTATIN/NYSTOP) powder Apply 1 g topically 4 (four) times daily as needed (rash).     ondansetron (ZOFRAN-ODT) 4 MG disintegrating tablet DISSOLVE ONE TABLET ON TONGUE EVERY 8 HOURS AS NEEDED FOR NAUSEA OR VOMITING 30 tablet 0   pantoprazole (PROTONIX) 40 MG tablet Take 1 tablet (40 mg total) by mouth daily. 30 tablet 0   Polyethyl Glycol-Propyl Glycol (SYSTANE) 0.4-0.3 % GEL ophthalmic gel Place 1 application into both eyes.     potassium chloride 20 MEQ/15ML (10%) SOLN Take 15 mLs (20 mEq total) by mouth 3 (three) times daily. 473 mL 0   pregabalin (LYRICA) 50 MG capsule Take 1 capsule (50 mg total) by mouth 2 (two) times daily. 60 capsule 3   Semaglutide, 1 MG/DOSE, (OZEMPIC, 1 MG/DOSE,) 4 MG/3ML SOPN Inject 1 mg into the skin once a week. 9 mL 1   spironolactone (ALDACTONE) 25 MG tablet Take 0.5 tablets (12.5 mg total) by mouth daily. 15 tablet 3   torsemide (DEMADEX) 20 MG tablet Take 1 tablet (20 mg total) by mouth daily. 30 tablet 2   valACYclovir (VALTREX) 500 MG tablet Take 500 mg by mouth 2 (two) times daily.     amoxicillin (AMOXIL) 500 MG capsule 4 capsules 1-2 hours pre procedure  (Patient not taking: No sig reported) 4 capsule 1   fexofenadine (ALLEGRA) 180 MG tablet Take 180 mg by mouth daily. (Patient not taking: No sig reported)     Lidocaine 4 % PTCH Apply topically daily. (Patient not taking: No sig reported)     nitroGLYCERIN (NITROSTAT) 0.4 MG SL tablet Place 1 tablet (0.4 mg total) under the tongue every 5 (five) minutes as needed for chest pain. (Patient not taking: No sig reported) 25 tablet 1   No current facility-administered medications for this visit.    Allergies as of 05/25/2021 - Review Complete 05/25/2021  Allergen Reaction Noted   Demeclocycline Hives 09/18/2013  Erythromycin Nausea And Vomiting and Other (See Comments) 08/17/2011   Flagyl [metronidazole] Nausea And Vomiting and Other (See Comments) 08/17/2011   Glucophage [metformin hcl] Nausea And Vomiting and Other (See Comments) 08/17/2011   Tetracyclines & related Hives and Rash 03/12/2011   Diovan [valsartan] Nausea Only 08/17/2011   Sulfa antibiotics Rash and Other (See Comments) 09/18/2013   Xanax [alprazolam] Other (See Comments) 08/17/2011    ROS:  General: Negative for anorexia, weight loss, fever, chills, fatigue, weakness. ENT: Negative for hoarseness, difficulty swallowing , nasal congestion. CV: Negative for chest pain, angina, palpitations, dyspnea on exertion, peripheral edema.  Respiratory: Negative for dyspnea at rest, dyspnea on exertion, cough, sputum, wheezing.  GI: See history of present illness. GU:  Negative for dysuria, hematuria, urinary incontinence, urinary frequency, nocturnal urination.  Endo: Negative for unusual weight change.    Physical Examination:   BP 132/82 (BP Location: Left Arm, Patient Position: Sitting, Cuff Size: Normal)   Pulse 87   Temp (!) 97 F (36.1 C) (Temporal)   Ht 5' 4.5" (1.638 m)   Wt 239 lb 12.8 oz (108.8 kg)   BMI 40.53 kg/m   General: Well-nourished, well-developed in no acute distress.  Eyes: No icterus. Conjunctivae  pink. Rectal : Obvious internal and external hemorrhoids with bleeding from a internal hemorrhoid in the anal canal. neuro: Alert and oriented x 3.  Grossly intact. Skin: Warm and dry, no jaundice.   Psych: Alert and cooperative, normal mood and affect.  Labs:    Imaging Studies: No results found.  Assessment and Plan:   KAILIN PRINCIPATO is a 75 y.o. y/o female with internal hemorrhoids actively oozing some blood.  The patient has been told to have a repeat EGD due to her esophageal stricture seen in the last EGD and will also be started on Anusol suppositories for her rectal bleeding due to her hemorrhoids.  The patient will also be started on Protonix due to her esophageal stricture.  The patient has been explained the plan and agrees with it.     [name], MD. Marval Regal    Note: This dictation was prepared with Dragon dictation along with smaller phrase technology. Any transcriptional errors that result from this process are unintentional.

## 2021-05-26 ENCOUNTER — Ambulatory Visit (INDEPENDENT_AMBULATORY_CARE_PROVIDER_SITE_OTHER): Payer: Medicare Other | Admitting: Medical

## 2021-05-26 ENCOUNTER — Other Ambulatory Visit: Payer: Self-pay

## 2021-05-26 ENCOUNTER — Encounter: Payer: Self-pay | Admitting: Medical

## 2021-05-26 VITALS — BP 122/60 | HR 78 | Ht 64.5 in | Wt 240.0 lb

## 2021-05-26 DIAGNOSIS — I5032 Chronic diastolic (congestive) heart failure: Secondary | ICD-10-CM | POA: Diagnosis not present

## 2021-05-26 DIAGNOSIS — E782 Mixed hyperlipidemia: Secondary | ICD-10-CM | POA: Diagnosis not present

## 2021-05-26 DIAGNOSIS — I25119 Atherosclerotic heart disease of native coronary artery with unspecified angina pectoris: Secondary | ICD-10-CM

## 2021-05-26 DIAGNOSIS — R1319 Other dysphagia: Secondary | ICD-10-CM

## 2021-05-26 NOTE — Progress Notes (Signed)
Cardiology Office Note:    Date:  05/29/2021   ID:  Denise Sharp, DOB [birth year], MRN [id number]  PCP:  [name], MD  [facility] Cardiologist:  [name], MD  [facility] Electrophysiologist:  None   Referring MD: [name], MD   Chief Complaint: 1 month follow-up  History of Present Illness:    Denise Sharp is a 75 y.o. female with a hx of CAD status post DES x2 to mid/ostial LAD 01/03/2020, tobacco use, hypertension, type 2 diabetes, iron deficiency anemia following gastric bypass x2, IBS, esophageal stenosis, chronic dizziness with gait instability, cervicogenic headache with cervical spine stenosis, OSA on CPAP, restless leg syndrome, adrenal mass.   Patient underwent echo 2013 for dizziness and dyspnea that showed EF 55 to 70%, normal diastolic dysfunction.  She has difficult to control hypertension on multiple agents.  In 2014 she underwent renal artery angiography which was normal and coronary angiography which was also normal with minimal luminal irregularities.   01/03/2020 she had worsening chest pain for 2 weeks which progressed to unstable angina.  She was taken for left heart cath which showed 65% ostial LAD disease, 80% mid LAD disease, LVEDP only mildly elevated, LV gram with normal EF.  She underwent PCI with DES x2 to mid/ostial LAD.  Echo showed EF 65 to 35%, grade 2 diastolic dysfunction, normal RV, no significant valvular abnormality.   She was seen back on 09/04/2020 despite intervention she still felt fairly poorly with shortness of breath.  She was started on Lasix with little improvement.  She was set up for right and left heart cath which showed nonobstructive coronary disease with 40% ostial RCA stenosis, widely patent LAD stents.  Right heart cath showed RA 17 mmHg, PCWP 22 mmHg.  She was switched to torsemide at one-point and metolazone was added.  She saw Georjean Mode 11/02/2020 for palpitations and nausea.  Heart monitor was placed  showing 11 beats NSVT longest run 11 beats, 14 SVT episodes longest was 18 beats.  She was started on metoprolol.  Patient developed AKI thought to be secondary to overdiuresis and torsemide was decreased.  She was for to nephrology and advanced heart failure clinic.   Patient's advanced heart failure clinic 01/11/2021.  She reported low blood pressures however they seem to be normotensive on her log.  She was taking torsemide 20 mg daily.  Weight trend 245 to 250 pounds.  It was felt most of her symptoms were due to her longstanding obesity and OHS, not much else from a heart failure standpoint to add on.   Seen in clinic 01/30/2021 and reported persistent chest pain.  A Myoview Lexiscan was ordered. Torsemide dose of 20mg  daily was continued.  Myocardial perfusion stress test showed low risk without ischemia or scar.   Last seen 02/15/21 reported persistent fatigue suspected to be multifactorial.  Metoprolol was discontinued given soft blood pressure and lightheadedness.  Last seen 05/03/21 and felt she was volume up. Torsemide was increased . Metolazone was stopped and spironolactone was added.   Today, she reports she is doing well from a cardiac perspective. She has been having low BG. Fells and tore her rectum. Saw GI and will continue to follow-up with this. She will need to do suppository. They did not do any labs yesterday. Has surgery on 29th for esophageal dilation. No chest pain. No worsening SOB. Has unchanged LLE. Does not wear compression socks or elevate feet.    Past Medical History:  Diagnosis Date   (HFpEF) heart failure with preserved ejection fraction ([place])    a. 12/2019 Echo: EF 65-70%, Gr2 DD. No significant valvular dzs.   Abnormality of gait 03/25/2013   Adrenal mass, left ([place])    Anemia    iron deficient post  2 unit txfsn 2009, normal endo/colonoscopy by [facility]   Arthritis    CAD (coronary artery disease) with h/o Atypical Chest Pain    a. 04/1986 Cath ([facility]): nl cors, EF  65%; b. 07/2012 Cath [facility]): Diff minor irregs; c. 07/2016 MV [facility]): "Equivocal"; d. 08/2016 Cardiac CT Ca2+ score [facility]): Ca2+ 5397; e. 05/2019 MV: No ischemia. EF 75%; f. 12/2019 PCI: LM nl, LAD 65ost (3.5x12 Resolute DES), 38m (2.75x12 Resolute DES),LCX/RCA nl; g. 08/2020 Cath: patent LAD stents, RCA 40ost, elev RH pressures, EF >65%.   Cervical spinal stenosis 1994   due to trauma to back ([employer] accident), has intermittent paralysis and parasthesias   Cervicogenic headache 03/23/2014   Depression    Dizziness    chronic dizziness   DJD (degenerative joint disease)    a. Chronic R shoulder pain pending R shoulder replacement 07/2017.   Esophageal stenosis March 2011   with transietn outlet obstruction by food, cleared by EGD    Family history of adverse reaction to anesthesia    daughter PONV   Gastric bypass status for obesity    Headache(784.0)    Hypertension    IBS (irritable bowel syndrome)    Left bundle branch block (LBBB)    a. Intermittently present - likely rate related. - patient denies   Obesity    Obstructive sleep apnea    using CPAP   Polyneuropathy in diabetes(357.2) 03/25/2013   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/09/2021   Right V1 distribution   Restless leg syndrome    Rotator cuff arthropathy, right 08/13/2017   Syncope and collapse 03/12/2014   Type II diabetes mellitus ([place])     Past Surgical History:  Procedure Laterality Date   ABDOMINAL HYSTERECTOMY     APPENDECTOMY     CARDIAC CATHETERIZATION     COLONOSCOPY     CORONARY STENT INTERVENTION N/A 01/04/2020   Procedure: CORONARY STENT INTERVENTION;  Surgeon: [name], MD;  Location: [place] [facility];  Service: Cardiovascular;  Laterality: N/A;  LAD    DIAPHRAGMATIC HERNIA REPAIR  2015   ESOPHAGEAL DILATION     multiple   ESOPHAGOGASTRODUODENOSCOPY     ESOPHAGOGASTRODUODENOSCOPY (EGD) WITH PROPOFOL N/A 05/11/2021   Procedure: ESOPHAGOGASTRODUODENOSCOPY (EGD) WITH PROPOFOL;  Surgeon: [name], MD;   Location: [facility];  Service: Endoscopy;  Laterality: N/A;   GALLBLADDER SURGERY  1976   GALLBLADDER SURGERY  resection   GASTRIC BYPASS     GASTRIC BYPASS  2000, 2005   Dr. [name] IMPLANT PLACEMENT  April 2013   [place] REPLACEMENT  2007   bilateral knee. [name],  [name]   LEFT HEART CATH AND CORS/GRAFTS ANGIOGRAPHY N/A 12/31/2019   Procedure: LEFT HEART CATH AND CORS/GRAFTS ANGIOGRAPHY;  Surgeon: [name], MD;  Location: [place] [facility];  Service: Cardiovascular;  Laterality: N/A;   PANNICULECTOMY  06/16/2019   PANNICULECTOMY N/A 06/16/2019   Procedure: PANNICULECTOMY;  Surgeon: [name], MD;  Location: [place];  Service: Plastics;  Laterality: N/A;  3 hours, please   REVERSE SHOULDER ARTHROPLASTY Right 08/13/2017   Procedure: REVERSE RIGHT SHOULDER ARTHROPLASTY;  Surgeon: [name], MD;  Location: [place];  Service: Orthopedics;  Laterality: Right;  RIGHT/LEFT HEART CATH AND CORONARY ANGIOGRAPHY N/A 09/06/2020   Procedure: RIGHT/LEFT HEART CATH AND CORONARY ANGIOGRAPHY;  Surgeon: [name], MD;  Location: [place] [facility];  Service: Cardiovascular;  Laterality: N/A;   ROTATOR CUFF REPAIR     right   SPINE SURGERY  1995   [place]   TOTAL ABDOMINAL HYSTERECTOMY W/ BILATERAL SALPINGOOPHORECTOMY  1478   UMBILICAL HERNIA REPAIR  Aug 11, 2015    Current Medications: Current Meds  Medication Sig   aspirin EC 81 MG tablet Take 1 tablet (81 mg total) by mouth daily.   atorvastatin (LIPITOR) 40 MG tablet Take 1 tablet (40 mg total) by mouth daily.   clotrimazole-betamethasone (LOTRISONE) cream Apply 1 application topically 2 (two) times daily as needed.   Continuous Blood Gluc Receiver (FREESTYLE LIBRE 2 READER) DEVI Use to check sugar at least TID   Continuous Blood Gluc Sensor (FREESTYLE LIBRE 2 SENSOR) MISC 1 application by Does not apply route every 14 (fourteen) days.   cyanocobalamin (,VITAMIN B-12,) 1000 MCG/ML injection Inject  1 mL (1,000 mcg total) into the muscle every 30 (thirty) days. Inject 1 ml (1000 mcg ) IM weekly x 4,  Then monthly thereafter   dapagliflozin propanediol (FARXIGA) 10 MG TABS tablet Take 1 tablet (10 mg total) by mouth daily.   diphenoxylate-atropine (LOMOTIL) 2.5-0.025 MG tablet Take 1 tablet by mouth 4 (four) times daily as needed for diarrhea or loose stools.   erythromycin ophthalmic ointment 1 application at bedtime.   fexofenadine (ALLEGRA) 180 MG tablet Take 180 mg by mouth as needed for allergies or rhinitis.   hydrocortisone (ANUSOL-HC) 25 MG suppository Place 1 suppository (25 mg total) rectally at bedtime.   insulin aspart (NOVOLOG FLEXPEN) 100 UNIT/ML FlexPen Inject 10-20 units up to three times daily before meals; If pre-meal glucose 100-150 - inject 10 units, 151-200 - inject 12 units; 201-250 - inject 14 units; 251-300 - inject 16 units, 301-350 - inject 18 units ; 350 > inject 20  units   insulin degludec (TRESIBA FLEXTOUCH) 100 UNIT/ML FlexTouch Pen Inject 15 Units into the skin daily.   Insulin Pen Needle (DROPLET PEN NEEDLES) 31G X 5 MM MISC USE ONE NEEDLE SUBCUTANEOUSLY AS DIRECTED (REMOVE AND DISCARD NEEDLE IN SHARPS CONTAINER IMMEDIATELY AFTER USE)   Lidocaine 4 % PTCH Apply topically as needed (pain).   nitroGLYCERIN (NITROSTAT) 0.4 MG SL tablet Place 1 tablet (0.4 mg total) under the tongue every 5 (five) minutes as needed for chest pain.   NONFORMULARY OR COMPOUNDED [place]  Combination Pain Cream -  Baclofen 2%, Doxepin 5%, Gabapentin 6%, Topiramate 2%, Pentoxifylline 3% Apply 1-2 grams to affected area 3-4 times daily Qty. 120 gm 3 refills   nystatin (MYCOSTATIN/NYSTOP) powder Apply 1 g topically 4 (four) times daily as needed (rash).   ondansetron (ZOFRAN-ODT) 4 MG disintegrating tablet DISSOLVE ONE TABLET ON TONGUE EVERY 8 HOURS AS NEEDED FOR NAUSEA OR VOMITING   pantoprazole (PROTONIX) 40 MG tablet Take 1 tablet (40 mg total) by mouth daily.    Polyethyl Glycol-Propyl Glycol (SYSTANE) 0.4-0.3 % GEL ophthalmic gel Place 1 application into both eyes.   potassium chloride 20 MEQ/15ML (10%) SOLN Take 15 mLs (20 mEq total) by mouth 3 (three) times daily.   pregabalin (LYRICA) 50 MG capsule Take 1 capsule (50 mg total) by mouth 2 (two) times daily.   Semaglutide, 1 MG/DOSE, (OZEMPIC, 1 MG/DOSE,) 4 MG/3ML SOPN Inject 1 mg into the skin once a week.   spironolactone (ALDACTONE) 25  MG tablet Take 0.5 tablets (12.5 mg total) by mouth daily.   torsemide (DEMADEX) 20 MG tablet Take 1 tablet (20 mg total) by mouth daily.   valACYclovir (VALTREX) 500 MG tablet Take 500 mg by mouth 2 (two) times daily.     Allergies:   Demeclocycline, Erythromycin, Flagyl [metronidazole], Glucophage [metformin hcl], Tetracyclines & related, Diovan [valsartan], Sulfa antibiotics, and Xanax [alprazolam]   Social History   Socioeconomic History   Marital status: Married    Spouse name: [name]    Number of children: 2   Years of education: College    Highest education level: Not on file  Occupational History    Employer: retired  Tobacco Use   Smoking status: Never   Smokeless tobacco: Never  Vaping Use   Vaping Use: Never used  Substance and Sexual Activity   Alcohol use: No   Drug use: Yes    Comment: prescribed valium   Sexual activity: Not Currently    Birth control/protection: Post-menopausal, Surgical  Other Topics Concern   Not on file  Social History Narrative   Patient lives at home with husband [name].    Right Handed   Drinks caffeinated tea occasionally   Social Determinants of Health   Financial Resource Strain: Low Risk    Difficulty of Paying Living Expenses: Not hard at all  Food Insecurity: No Food Insecurity   Worried About Charity fundraiser in the Last Year: Never true   Arboriculturist in the Last Year: Never true  Transportation Needs: No Transportation Needs   Lack of Transportation (Medical): No   Lack of Transportation  (Non-Medical): No  Physical Activity: Insufficiently Active   Days of Exercise per Week: 2 days   Minutes of Exercise per Session: 40 min  Stress: Stress Concern Present   Feeling of Stress : Rather much  Social Connections: Not on file     Family History: The patient's family history includes Depression in her mother; Diabetes in her daughter; Headache in her mother; Heart disease in her daughter, father, and mother; Hypertension in her daughter, father, mother, and son; Osteoporosis in her father; Prostate cancer in her father; Stroke in her father and mother; Thyroid disease in her mother.  ROS:   Please see the history of present illness.     All other systems reviewed and are negative.  EKGs/Labs/Other Studies Reviewed:    The following studies were reviewed today:  Cardiac cath 09/06/2020 Conclusions: Non-obstructive coronary artery disease with 40% ostial RCA stenosis. Widely patient ostial/proximal and mid LAD stents, with the distal LAD tapering to a small vessel. Mildly elevated left heart and pulmonary artery pressures. Severely elevated right heart filling pressures. Low Fick cardiac output/index consistent with HFpEF.   Recommendations: Continue gentle diuresis. Encourage weight loss and exercise. Consider outpatient referral to advanced heart failure clinic for further evaluation/management of chronic HFpEF. Continue secondary prevention of coronary artery disease.   [name], MD [place]   Cardiac cath 01/04/2020   Ost LAD to Prox LAD lesion is 80% stenosed. Post intervention, there is a 0% residual stenosis. Post intervention, there is a 0% residual stenosis. A drug-eluting stent was successfully placed using a STENT RESOLUTE ONYX 3.5X12. Mid LAD lesion is 80% stenosed. A drug-eluting stent was successfully placed using a [place] [id number].   Successful PCI and drug-eluting stent placement to mid as well as ostial LAD.    Recommendations: Dual antiplatelet therapy for at least 6 months but should  consider long-term dual antiplatelet therapy given that the ostial LAD stent extends a millimeter into the left main coronary artery Aggressive treatment of risk factors. Likely discharge home tomorrow if no issues.   Echo 12/30/2019 1. Left ventricular ejection fraction, by estimation, is 65 to 70%. The  left ventricle has normal function. The left ventricle has no regional  wall motion abnormalities. Left ventricular diastolic parameters are  consistent with Grade II diastolic  dysfunction (pseudonormalization).   2. Right ventricular systolic function is normal. The right ventricular  size is normal.   3. The mitral valve is normal in structure. No evidence of mitral valve  regurgitation. No evidence of mitral stenosis.   4. The aortic valve is normal in structure. Aortic valve regurgitation is  not visualized. No aortic stenosis is present.   Heart monitor 11/15/2020   The patient was monitored for 4 days, 22 hours. The predominant rhythm was sinus with an average rate of 77 bpm (range 53-121 bpm and sinus). There were rare PACs and PVCs. 11 episodes of nonsustained ventricular tachycardia were observed, lasting up to 8 beats with a maximum rate of 210 bpm. 14 atrial runs lasting up to 18 beats with a maximum rate of 250 bpm occurred. Some episodes labeled as supraventricular tachycardia with aberrancy could represent nonsustained ventricular tachycardia. Patient triggered events correspond to sinus rhythm and sinus rhythm with PVCs and artifact.   Predominantly sinus rhythm with rare PACs and PVCs.  Several episodes of nonsustained ventricular tachycardia and supraventricular tachycardia were observed.  EKG:  EKG is not ordered today.    Recent Labs: 02/08/2021: ALT 20; TSH 2.209 02/10/2021: Magnesium 1.9 04/04/2021: BNP 45.0 04/26/2021: Hemoglobin 13.7; Platelets 308 05/12/2021: BUN 13; Creatinine, Ser  1.26; Potassium 4.0; Sodium 145  Recent Lipid Panel    Component Value Date/Time   CHOL 134 05/24/2020 1532   CHOL 155 02/27/2014 0420   TRIG 128.0 05/24/2020 1532   TRIG 121 02/27/2014 0420   HDL 60.40 05/24/2020 1532   HDL 43 02/27/2014 0420   CHOLHDL 2 05/24/2020 1532   VLDL 25.6 05/24/2020 1532   VLDL 24 02/27/2014 0420   LDLCALC 48 05/24/2020 1532   LDLCALC 88 02/27/2014 0420   LDLDIRECT 111.0 06/16/2015 0936     Physical Exam:    VS:  BP 122/60   Pulse 78   Ht 5' 4.5" (1.638 m)   Wt 240 lb (108.9 kg)   SpO2 95%   BMI 40.56 kg/m     Wt Readings from Last 3 Encounters:  05/26/21 240 lb (108.9 kg)  05/25/21 239 lb 12.8 oz (108.8 kg)  05/11/21 245 lb 6 oz (111.3 kg)     GEN:  Well nourished, well developed in no acute distress HEENT: Normal NECK: No JVD; No carotid bruits LYMPHATICS: No lymphadenopathy CARDIAC: RRR, no murmurs, rubs, gallops RESPIRATORY:  Clear to auscultation without rales, wheezing or rhonchi  ABDOMEN: Soft, non-tender, non-distended MUSCULOSKELETAL:  No edema; No deformity  SKIN: Warm and dry NEUROLOGIC:  Alert and oriented x 3 PSYCHIATRIC:  Normal affect   ASSESSMENT:    1. Chronic diastolic heart failure ([place])   2. Coronary artery disease involving native coronary artery of native heart with angina pectoris ([place])   3. Hyperlipidemia, mixed   4. Morbid obesity ([place])    PLAN:    In order of problems listed above:  HFpEF Patient reports stable symptoms since increase in Torsemide and adding spironolactone Follow-up labs showed stable kidney function. Euvolemic on exam today.  No further changes. Continue spironolactone, torsemide, Farxiga  CAD Symptoms improved with increase in Torsemide. Repeat cah 08/2020 showed nonobstructive disease with patent LAD stents. Continue ASA, statin. Metoprolol previously stopped for hypotension.   HLD Continue statin. Lipid at goal on last check.   Morbid obesity Weight loss  recommended  Disposition: Follow up in 3 month(s) with Md/APP     Signed, Kamayah Pillay Ninfa Meeker, PA-C  05/29/2021 12:54 PM    [facility]

## 2021-05-26 NOTE — Patient Instructions (Signed)
Medication Instructions:  Please Continue your current medication  *If you need a refill on your cardiac medications before your next appointment, please call your pharmacy*   Lab Work: None  Testing/Procedures: None   Follow-Up: At [facility], you and your health needs are our priority.  As part of our continuing mission to provide you with exceptional heart care, we have created designated Provider Care Teams.  These Care Teams include your primary Cardiologist (physician) and Advanced Practice Providers (APPs -  Physician Assistants and Nurse Practitioners) who all work together to provide you with the care you need, when you need it.   Your next appointment:   3 month(s)  The format for your next appointment:   In Person  Provider:   [name], MD

## 2021-06-05 ENCOUNTER — Telehealth: Payer: Self-pay | Admitting: Internal Medicine

## 2021-06-05 NOTE — Telephone Encounter (Signed)
Pt called in regards to BP dropping consistently for two weeks. Pt states she passed out in [employer] two weeks ago. EMT came and helped her get situated so she was never seen in ED. Pt has been dealing with this since. Pt also states her Elenor Legato alarm has been alerting her everyday. Pt states she has been unable to eat due to nausea. Pt states she will eat 2-3 bites and will be completely full, unable to eat more. Pt is concerned her not eating has also effected her BP levels. Pt was sent to access nurse.

## 2021-06-05 NOTE — Telephone Encounter (Signed)
Patient stated her bp occasionally is low like that every now and then she stated to disregard that and focus on the BS. Her only concern was the BS. She stated She did not take any BP medication the day her BP was low and she didn't have chest pain. The BS she stated she will comply with PCP directions.

## 2021-06-05 NOTE — Telephone Encounter (Signed)
I likely won't have time to call her until much later this afternoon. I see documentation in the triage note that she was having chest pain at one point? If chest pain, would recommend urgent evaluation, or at least communication to cardiology if she refuses evaluation.

## 2021-06-05 NOTE — Telephone Encounter (Signed)
Spoke with pt and she stated that she was told that Catie would give her a call as soon as she got out of the room with a pt and pt stated she has not heard from her yet. I explained to pt that when a pt calls in and needs to be triaged the message gets sent to her PCP. Pt stated that she and [name] does not discuss her diabetes during their visits that Catie takes care of all of that. Pt would like the message to be sent to Catie to see what she recommends as far as her insulin and other diabetes medications. Pt was asked if she had an appt with Catie and she stated no. I let pt know that I could send the message to both Catie and [name]. Pt is concerned about her BS dropping to low, at 5 am it was 54 and her bp was 80/40.

## 2021-06-06 ENCOUNTER — Ambulatory Visit (INDEPENDENT_AMBULATORY_CARE_PROVIDER_SITE_OTHER): Payer: Medicare Other | Admitting: Pharmacist

## 2021-06-06 ENCOUNTER — Other Ambulatory Visit: Payer: Self-pay

## 2021-06-06 DIAGNOSIS — E1142 Type 2 diabetes mellitus with diabetic polyneuropathy: Secondary | ICD-10-CM

## 2021-06-06 MED ORDER — TRESIBA FLEXTOUCH 100 UNIT/ML ~~LOC~~ SOPN: 10.0000 [IU] | PEN_INJECTOR | Freq: Every day | SUBCUTANEOUS | 3 refills | Status: DC

## 2021-06-06 NOTE — Patient Instructions (Signed)
Increase Ozempic to 1 mg weekly.  DECREASE Tresiba 10 units once daily.  HOLD on Novolog.  CONTINUE Farxiga 10 mg daily   Schedule your yearly diabetic eye exam.   Call me if you have continued low blood sugars <70 or you start to have high blood sugars >250.   Thanks!  Catie Darnelle Maffucci, PharmD  Visit Information Patient Goals/Self-Care Activities Over the next 90 days, patient will:  - take medications as prescribed check blood glucose at least three times daily using CGM, document, and provide at future appointments        Plan: Face to Face appointment with care management team member scheduled for: 6 weeks   Catie Darnelle Maffucci, PharmD, [place], CPP Clinical Pharmacist [place] at [facility] [phone]   Please call the care guide team at [phone] if you need to cancel or reschedule your appointment.   Print copy of patient instructions, educational materials, and care plan provided in person.

## 2021-06-06 NOTE — Chronic Care Management (AMB) (Signed)
Chronic Care Management CCM Pharmacy Note  06/06/2021 Name:  Denise Sharp MRN:  [id number] DOB:  01-06-1946  Summary: - Dentures aren't fitting right, she has not been eating normally, resulting in low blood sugar episodes  Recommendations/Changes made from today's visit: - Increase Ozempic to 1 mg as previously recommended. Decrease Tresiba to 10 units daily. Stop Novolog. Continue Farxiga 10 mg daily  Subjective: Denise Sharp is an 75 y.o. year old female who is a primary patient of Tullo, Aris Everts, MD.  The CCM team was consulted for assistance with disease management and care coordination needs.    Engaged with patient face to face for follow up visit for pharmacy case management and/or care coordination services.   Objective:  Medications Reviewed Today     Reviewed by Antony Madura PA-C (Physician Assistant Certified) on 42/87/68 at 1544  Med List Status: <None>   Medication Order Taking? Sig Documenting Provider Last Dose Status Informant  aspirin EC 81 MG tablet [id number] Yes Take 1 tablet (81 mg total) by mouth daily. Fritzi Mandes, MD Taking Active Self  atorvastatin (LIPITOR) 40 MG tablet [id number] Yes Take 1 tablet (40 mg total) by mouth daily. [name], MD Taking Active   clotrimazole-betamethasone Donalynn Furlong) cream [id number] Yes Apply 1 application topically 2 (two) times daily as needed. [provider] Taking Active Self  Continuous Blood Gluc Receiver (FREESTYLE LIBRE 2 READER) DEVI [id number] Yes Use to check sugar at least TID [name], MD Taking Active Self  Continuous Blood Gluc Sensor (FREESTYLE LIBRE 2 SENSOR) MISC [id number] Yes 1 application by Does not apply route every 14 (fourteen) days. [name], MD Taking Active   cyanocobalamin (,VITAMIN B-12,) 1000 MCG/ML injection [id number] Yes Inject 1 mL (1,000 mcg total) into the muscle every 30 (thirty) days. Inject 1 ml (1000 mcg ) IM weekly x 4,  Then monthly thereafter  [name], MD Taking Active   dapagliflozin propanediol (FARXIGA) 10 MG TABS tablet [id number] Yes Take 1 tablet (10 mg total) by mouth daily. [name], MD Taking Active   diphenoxylate-atropine (LOMOTIL) 2.5-0.025 MG tablet [id number] Yes Take 1 tablet by mouth 4 (four) times daily as needed for diarrhea or loose stools. [name], MD Taking Active   erythromycin ophthalmic ointment [id number] Yes 1 application at bedtime. [provider] Taking Active   fexofenadine (ALLEGRA) 180 MG tablet [id number] Yes Take 180 mg by mouth as needed for allergies or rhinitis. [provider] Taking Active   hydrocortisone (ANUSOL-HC) 25 MG suppository [id number] Yes Place 1 suppository (25 mg total) rectally at bedtime. [name], MD Taking Active   insulin aspart (NOVOLOG FLEXPEN) 100 UNIT/ML FlexPen [id number] Yes Inject 10-20 units up to three times daily before meals; If pre-meal glucose 100-150 - inject 10 units, 151-200 - inject 12 units; 201-250 - inject 14 units; 251-300 - inject 16 units, 301-350 - inject 18 units ; 350 > inject 20  units Tullo, Aris Everts, MD Taking Active   insulin degludec (TRESIBA FLEXTOUCH) 100 UNIT/ML FlexTouch Pen [id number] Yes Inject 15 Units into the skin daily. [name], MD Taking Active   Insulin Pen Needle (DROPLET PEN NEEDLES) 31G X 5 MM MISC [id number] Yes USE ONE NEEDLE SUBCUTANEOUSLY AS DIRECTED (REMOVE AND DISCARD NEEDLE IN SHARPS CONTAINER IMMEDIATELY AFTER USE) [name], MD Taking Active   Lidocaine 4 % [place] [id number] Yes Apply topically as needed (pain). [provider] Taking Active   nitroGLYCERIN (  NITROSTAT) 0.4 MG SL tablet [id number] Yes Place 1 tablet (0.4 mg total) under the tongue every 5 (five) minutes as needed for chest pain. Theora Gianotti, NP Taking Active   NONFORMULARY OR COMPOUNDED [place] [id number] Yes [facility]  Combination Pain Cream -  Baclofen 2%, Doxepin 5%, Gabapentin 6%,  Topiramate 2%, Pentoxifylline 3% Apply 1-2 grams to affected area 3-4 times daily Qty. 120 gm 3 refills Gardiner Barefoot, DPM Taking Active Self           Med Note [facility], BRANDY L   Fri Sep 02, 2020  2:40 PM)    nystatin (MYCOSTATIN/NYSTOP) powder [id number] Yes Apply 1 g topically 4 (four) times daily as needed (rash). [provider] Taking Active Self  ondansetron (ZOFRAN-ODT) 4 MG disintegrating tablet [id number] Yes DISSOLVE ONE TABLET ON TONGUE EVERY 8 HOURS AS NEEDED FOR NAUSEA OR VOMITING [name], MD Taking Active   pantoprazole (PROTONIX) 40 MG tablet [id number] Yes Take 1 tablet (40 mg total) by mouth daily. [name], MD Taking Active   Polyethyl Glycol-Propyl Glycol (SYSTANE) 0.4-0.3 % GEL ophthalmic gel [id number] Yes Place 1 application into both eyes. [provider] Taking Active   potassium chloride 20 MEQ/15ML (10%) SOLN [id number] Yes Take 15 mLs (20 mEq total) by mouth 3 (three) times daily. End, Harrell Gave, MD Taking Active   pregabalin (LYRICA) 50 MG capsule [id number] Yes Take 1 capsule (50 mg total) by mouth 2 (two) times daily. Kathrynn Ducking, MD Taking Active   Semaglutide, 1 MG/DOSE, (OZEMPIC, 1 MG/DOSE,) 4 MG/3ML SOPN [id number] Yes Inject 1 mg into the skin once a week. [name], MD Taking Active   spironolactone (ALDACTONE) 25 MG tablet [id number] Yes Take 0.5 tablets (12.5 mg total) by mouth daily. End, Harrell Gave, MD Taking Active   torsemide (DEMADEX) 20 MG tablet [id number] Yes Take 1 tablet (20 mg total) by mouth daily. End, Harrell Gave, MD Taking Active   valACYclovir (VALTREX) 500 MG tablet [id number] Yes Take 500 mg by mouth 2 (two) times daily. [provider] Taking Active   Med List Note Saunders Revel T, CPhT 07/22/17 1307): CPAP            Pertinent Labs:   Lab Results  Component Value Date   HGBA1C 8.9 (H) 02/08/2021   Lab Results  Component Value Date   CHOL 134 05/24/2020   HDL 60.40  05/24/2020   LDLCALC 48 05/24/2020   LDLDIRECT 111.0 06/16/2015   TRIG 128.0 05/24/2020   CHOLHDL 2 05/24/2020   Lab Results  Component Value Date   CREATININE 1.26 (H) 05/12/2021   BUN 13 05/12/2021   NA 145 (H) 05/12/2021   K 4.0 05/12/2021   CL 102 05/12/2021   CO2 23 05/12/2021    SDOH:  (Social Determinants of Health) assessments and interventions performed:  SDOH Interventions    Flowsheet Row Most Recent Value  SDOH Interventions   Financial Strain Interventions Intervention Not Indicated       CCM Care Plan  Review of patient past medical history, allergies, medications, health status, including review of consultants reports, laboratory and other test data, was performed as part of comprehensive evaluation and provision of chronic care management services.   Care Plan : Medication Management  Updates made by De Hollingshead, RPH-CPP since 06/06/2021 12:00 AM     Problem: Diabetes, CHF      Long-Range Goal: Disease Progression Prevention   Recent Progress: On track  Priority: High  Note:  Current Barriers:  Unable to achieve control of diabetes   Pharmacist Clinical Goal(s):  Over the next 90 days, patient will achieve control of diabetes as evidenced by improvement in A1c through collaboration with PharmD and provider  Interventions: 1:1 collaboration with [name], MD regarding development and update of comprehensive plan of care as evidenced by provider attestation and co-signature Inter-disciplinary care team collaboration (see longitudinal plan of care) Comprehensive medication review performed; medication list updated in electronic medical record  Health Maintenance Yearly diabetic eye exam: due Yearly diabetic foot exam: up to date Urine microalbumin: up to date Yearly influenza vaccination: due Td/Tdap vaccination: up to date Pneumonia vaccination: up to date COVID vaccinations: up to date Shingrix vaccinations: due  Colonoscopy:  up to date   Diabetes: Uncontrolled; current treatment: Farxiga 10 mg daily, Tresiba 15 units daily, Novolog 10-20 units up to TID with meals, Ozempic 1 mg weekly - though patient did not increase as instructed  Confirms that she received 1 mg pen of Ozempic but that she has been clicking up half way.  Reports issues with her dentures fitting, so she has not been eating normally lately. Resulting in low blood sugars. Reports an episode when she passed out from hypoglycemia at [facility], but she declined evaluation.  Hx intolerance to metformin d/t diarrhea Hx Trulicity - exacerbated diarrhea  Hx glipizide - stopped previously when A1c was well controlled  Hx Januvia - stopped previously when Trulicity was started  Avoiding TZD due to HF Current glucose readings: using Libre 2 CGM Date of Download: 11/9-11/22/22 % Time CGM is active: 92% Average Glucose: 149 mg/dL Glucose Management Indicator: 6.9  Glucose Variability: 45.1 (goal <36%) Time in Goal:  - Time in range 70-180: 67% - Time above range: 28% - Time below range: 5% Observed patterns:  post prandial and fasting hypoglycemia episodes.  Increase Ozempic to 1 mg daily as previously instructed. Goal of minimizing insulin therapy to minimize hypoglycemic episodes. Discussed glucose-dependent mechanism of GLP1.  Decrease Tresiba to 10 units daily. Stop Novolog. Continue Farxiga 10 mg daily.    Heart Failure, preserved:  Appropriately managed; current treatment:  ARNI/ACEi/ARB: none d/t intolerance  Beta blocker: metoprolol tartrate 12.5 mg BID SGLT2: Farxiga 10 mg daily Mineralocorticoid Receptor Antagonist: spironolactone 12.5 mg daily Diuretic: torsemide 20 mg daily + potassium oral TID  Confirms understanding of importance of weighing daily. Confirms understanding to contact cardiology/primary care team if weight gain >3 lbs in 1 day or >5 lbs in 1 week Reports home readings occasionally <100/60, but she reports this happens  "often", but less often recently. Denies chest pain over the past few days, she reports she was just stressed and upset about low blood sugars.  Previously recommended to continue current regimen at this time along with cardiology collaboration.   Hyperlipidemia, secondary ASCVD prevention: Controlled per last lab result; current treatment: atorvastatin 40 mg daily  Current antiplatelet regimen: aspirin 81 mg daily Previously recommended to continue current regimen at this time  Nocturnal "leg cramps", as well as chronic pain More exacerbated recently per patient report. Current regimen: pregabalin 50 mg BID, valacyclovir 500 mg BID established with Dr. Jannifer Franklin neurology.  Previously recommended to continue current regimen at this time.  Chronic Diarrhea, S/p Gastric Bypass, Esophageal "growth" Moderately well managed, given patient history. Current regimen: lomotil 4 tabs daily and loperamide BID Previously recommended to continue current regimen at this time  COPD, allergies:  Well controlled; Current regimen: Anoro 62.5/25 mcg daily, albuterol  HFA PRN; fexofenadine 180 mg daily  Previously recommended to continue current regimen at this time.  Patient Goals/Self-Care Activities Over the next 90 days, patient will:  - take medications as prescribed check blood glucose at least three times daily using CGM, document, and provide at future appointments      Plan: Face to Face appointment with care management team member scheduled for: 6 weeks  Catie Darnelle Maffucci, PharmD, [place], [place] Pharmacist [employer] at [facility] [phone]

## 2021-06-07 ENCOUNTER — Telehealth: Payer: Self-pay | Admitting: Internal Medicine

## 2021-06-07 NOTE — Telephone Encounter (Signed)
Placed call to pt to see if shed like to schedule visit with Dutch Quint today for urinary symptoms. Unable to leave VM

## 2021-06-07 NOTE — Telephone Encounter (Signed)
Pt called in stating that she spoke with you yesterday and forgot to advise you that she is having trouble passing urine. Pt stated that it burns and hurt when she urine. Pt was wondering if she needs to leave a specimen so we can she to see if she have an infection. Pt is requesting callback

## 2021-06-13 ENCOUNTER — Ambulatory Visit: Payer: Medicare Other | Admitting: Anesthesiology

## 2021-06-13 ENCOUNTER — Encounter
Admission: RE | Disposition: A | Discharge status: Home / Self Care | Payer: Self-pay | Source: Home / Self Care | Attending: Gastroenterology

## 2021-06-13 ENCOUNTER — Encounter: Payer: Self-pay | Admitting: Gastroenterology

## 2021-06-13 ENCOUNTER — Ambulatory Visit
Admission: RE | Admit: 2021-06-13 | Discharge: 2021-06-13 | Disposition: A | Discharge status: Home / Self Care | Payer: Medicare Other | Attending: Gastroenterology | Admitting: Gastroenterology

## 2021-06-13 DIAGNOSIS — I447 Left bundle-branch block, unspecified: Secondary | ICD-10-CM | POA: Diagnosis not present

## 2021-06-13 DIAGNOSIS — K222 Esophageal obstruction: Secondary | ICD-10-CM

## 2021-06-13 DIAGNOSIS — M1990 Unspecified osteoarthritis, unspecified site: Secondary | ICD-10-CM | POA: Insufficient documentation

## 2021-06-13 DIAGNOSIS — M5430 Sciatica, unspecified side: Secondary | ICD-10-CM | POA: Insufficient documentation

## 2021-06-13 DIAGNOSIS — K644 Residual hemorrhoidal skin tags: Secondary | ICD-10-CM | POA: Diagnosis not present

## 2021-06-13 DIAGNOSIS — I5032 Chronic diastolic (congestive) heart failure: Secondary | ICD-10-CM | POA: Insufficient documentation

## 2021-06-13 DIAGNOSIS — E1142 Type 2 diabetes mellitus with diabetic polyneuropathy: Secondary | ICD-10-CM | POA: Diagnosis not present

## 2021-06-13 DIAGNOSIS — K648 Other hemorrhoids: Secondary | ICD-10-CM | POA: Diagnosis not present

## 2021-06-13 DIAGNOSIS — R519 Headache, unspecified: Secondary | ICD-10-CM | POA: Insufficient documentation

## 2021-06-13 DIAGNOSIS — I110 Hypertensive heart disease with heart failure: Secondary | ICD-10-CM | POA: Insufficient documentation

## 2021-06-13 DIAGNOSIS — Z955 Presence of coronary angioplasty implant and graft: Secondary | ICD-10-CM | POA: Insufficient documentation

## 2021-06-13 DIAGNOSIS — F418 Other specified anxiety disorders: Secondary | ICD-10-CM | POA: Diagnosis not present

## 2021-06-13 DIAGNOSIS — I25119 Atherosclerotic heart disease of native coronary artery with unspecified angina pectoris: Secondary | ICD-10-CM | POA: Diagnosis not present

## 2021-06-13 DIAGNOSIS — Z9884 Bariatric surgery status: Secondary | ICD-10-CM | POA: Insufficient documentation

## 2021-06-13 DIAGNOSIS — R1319 Other dysphagia: Secondary | ICD-10-CM | POA: Diagnosis not present

## 2021-06-13 DIAGNOSIS — K625 Hemorrhage of anus and rectum: Secondary | ICD-10-CM | POA: Diagnosis present

## 2021-06-13 DIAGNOSIS — G4733 Obstructive sleep apnea (adult) (pediatric): Secondary | ICD-10-CM | POA: Insufficient documentation

## 2021-06-13 DIAGNOSIS — D649 Anemia, unspecified: Secondary | ICD-10-CM | POA: Diagnosis not present

## 2021-06-13 HISTORY — PX: ESOPHAGOGASTRODUODENOSCOPY (EGD) WITH PROPOFOL: SHX5813

## 2021-06-13 LAB — GLUCOSE, CAPILLARY: Glucose-Capillary: 145 mg/dL — ABNORMAL HIGH (ref 70–99)

## 2021-06-13 SURGERY — ESOPHAGOGASTRODUODENOSCOPY (EGD) WITH PROPOFOL
Anesthesia: General

## 2021-06-13 MED ORDER — PROPOFOL 10 MG/ML IV BOLUS
INTRAVENOUS | Status: DC | PRN
  Administered 2021-06-13: 80 mg via INTRAVENOUS

## 2021-06-13 MED ORDER — SODIUM CHLORIDE 0.9 % IV SOLN
INTRAVENOUS | Status: DC
  Administered 2021-06-13: 1000 mL via INTRAVENOUS

## 2021-06-13 MED ORDER — DEXMEDETOMIDINE (PRECEDEX) IN NS 20 MCG/5ML (4 MCG/ML) IV SYRINGE
PREFILLED_SYRINGE | INTRAVENOUS | Status: DC | PRN
  Administered 2021-06-13: 12 ug via INTRAVENOUS

## 2021-06-13 MED ORDER — PROPOFOL 500 MG/50ML IV EMUL
INTRAVENOUS | Status: DC | PRN
  Administered 2021-06-13: 150 ug/kg/min via INTRAVENOUS

## 2021-06-13 MED ORDER — LIDOCAINE HCL (CARDIAC) PF 100 MG/5ML IV SOSY
PREFILLED_SYRINGE | INTRAVENOUS | Status: DC | PRN
  Administered 2021-06-13: 40 mg via INTRAVENOUS

## 2021-06-13 NOTE — Op Note (Signed)
[facility] Gastroenterology Patient Name: Denise Sharp Procedure Date: 06/13/2021 11:05 AM MRN: [id number] Account #: [id number] Date of Birth: April 16, 1946 Admit Type: Outpatient Age: 75 Room: [facility] ENDO ROOM 4 Gender: Female Note Status: Finalized Instrument Name: Altamese Cabal Endoscope [id number] Procedure:             Upper GI endoscopy Indications:           Dysphagia Providers:             [name] MD, MD Medicines:             Propofol per Anesthesia Complications:         No immediate complications. Procedure:             Pre-Anesthesia Assessment:                        - Prior to the procedure, a History and Physical was                         performed, and patient medications and allergies were                         reviewed. The patient's tolerance of previous                         anesthesia was also reviewed. The risks and benefits                         of the procedure and the sedation options and risks                         were discussed with the patient. All questions were                         answered, and informed consent was obtained. Prior                         Anticoagulants: The patient has taken no previous                         anticoagulant or antiplatelet agents. ASA Grade                         Assessment: II - A patient with mild systemic disease.                         After reviewing the risks and benefits, the patient                         was deemed in satisfactory condition to undergo the                         procedure.                        After obtaining informed consent, the endoscope was                         passed under direct vision. Throughout the procedure,  the patient's blood pressure, pulse, and oxygen                         saturations were monitored continuously. The Endoscope                         was introduced through the mouth, and advanced to the                          jejunum. The upper GI endoscopy was accomplished                         without difficulty. The patient tolerated the                         procedure well. Findings:      One benign-appearing, intrinsic mild stenosis was found at the       gastroesophageal junction. The stenosis was traversed. A TTS dilator was       passed through the scope. Dilation with a 15-16.5-18 mm balloon dilator       was performed to 18 mm.      Evidence of a Roux-en-Y gastrojejunostomy was found. The gastrojejunal       anastomosis was characterized by healthy appearing mucosa. This was       traversed.      The examined jejunum was normal. Impression:            - Benign-appearing esophageal stenosis. Dilated.                        - Roux-en-Y gastrojejunostomy with gastrojejunal                         anastomosis characterized by healthy appearing mucosa.                        - Normal examined jejunum.                        - No specimens collected. Recommendation:        - Discharge patient to home.                        - Resume previous diet.                        - Continue present medications.                        - Repeat upper endoscopy PRN for retreatment. Procedure Code(s):     --- Professional ---                        [phone], Esophagogastroduodenoscopy, flexible,                         transoral; with transendoscopic balloon dilation of                         esophagus (less than 30 mm diameter) Diagnosis Code(s):     --- Professional ---  R13.10, Dysphagia, unspecified                        K22.2, Esophageal obstruction CPT copyright 2019 American Medical Association. All rights reserved. The codes documented in this report are preliminary and upon coder review may  be revised to meet current compliance requirements. [name] MD, MD 06/13/2021 11:29:45 AM This report has been signed electronically. Number of Addenda: 0 Note Initiated On:  06/13/2021 11:05 AM Estimated Blood Loss:  Estimated blood loss: none.      [facility]

## 2021-06-13 NOTE — Anesthesia Preprocedure Evaluation (Addendum)
Anesthesia Evaluation  Patient identified by MRN, date of birth, ID band Patient awake    Reviewed: Allergy & Precautions, NPO status , Patient's Chart, lab work & pertinent test results  History of Anesthesia Complications Negative for: history of anesthetic complications  Airway Mallampati: II  TM Distance: >3 FB Neck ROM: Full    Dental  (+) Upper Dentures, Lower Dentures   Pulmonary sleep apnea and Continuous Positive Airway Pressure Ventilation ,    Pulmonary exam normal  (-) decreased breath sounds      Cardiovascular Exercise Tolerance: Poor hypertension, Pt. on medications + angina with exertion + CAD (stents) and + DOE  Normal cardiovascular exam    LBBB   Neuro/Psych  Headaches, Anxiety Depression   Charcot Peripheral neuropathy Sciatica  Scooter/wheelchair/walker due to weak legs, poor strength, deconditioning    GI/Hepatic Neg liver ROS,   S/p RYGBP   Endo/Other  diabetes, Poorly Controlled  Renal/GU CRFRenal disease  negative genitourinary   Musculoskeletal  (+) Arthritis ,   Abdominal (+) + obese,   Peds negative pediatric ROS (+)  Hematology  (+) anemia ,   Anesthesia Other Findings 06/2019 Sabra Heck 3 grade 1 view  01/2021 Pharmacological myocardial perfusion imaging study with no significant  ischemia Normal wall motion, EF estimated at 96% No EKG changes concerning for ischemia at peak stress or in recovery. CT attenuation correction images with no significant aortic atherosclerosis, stents noted in the LAD Low risk scan  Cath 09/06/20 1. Non-obstructive coronary artery disease with 40% ostial RCA stenosis. 2. Widely patient ostial/proximal and mid LAD stents, with the distal LAD tapering to a small vessel. 3. Mildly elevated left heart and pulmonary artery pressures. 4. Severely elevated right heart filling pressures. 5. Low Fick cardiac output/index consistent with HFpEF.  Echo  12/2019  1. Left ventricular ejection fraction, by estimation, is 65 to 70%. The  left ventricle has normal function. The left ventricle has no regional  wall motion abnormalities. Left ventricular diastolic parameters are  consistent with Grade II diastolic  dysfunction (pseudonormalization).  2. Right ventricular systolic function is normal. The right ventricular  size is normal.  3. The mitral valve is normal in structure. No evidence of mitral valve  regurgitation. No evidence of mitral stenosis.  4. The aortic valve is normal in structure. Aortic valve regurgitation is  not visualized. No aortic stenosis is present.   Reproductive/Obstetrics negative OB ROS                           Anesthesia Physical Anesthesia Plan  ASA: 4  Anesthesia Plan: General   Post-op Pain Management:    Induction:   PONV Risk Score and Plan:   Airway Management Planned: Natural Airway  Additional Equipment:   Intra-op Plan:   Post-operative Plan:   Informed Consent: I have reviewed the patients History and Physical, chart, labs and discussed the procedure including the risks, benefits and alternatives for the proposed anesthesia with the patient or authorized representative who has indicated his/her understanding and acceptance.       Plan Discussed with:   Anesthesia Plan Comments: ( )       Anesthesia Quick Evaluation

## 2021-06-13 NOTE — Anesthesia Postprocedure Evaluation (Signed)
Anesthesia Post Note  Patient: Denise Sharp  Procedure(s) Performed: ESOPHAGOGASTRODUODENOSCOPY (EGD) WITH PROPOFOL  Patient location during evaluation: PACU Anesthesia Type: General Level of consciousness: awake and alert Pain management: pain level controlled Vital Signs Assessment: post-procedure vital signs reviewed and stable Respiratory status: spontaneous breathing, nonlabored ventilation, respiratory function stable and patient connected to nasal cannula oxygen Cardiovascular status: blood pressure returned to baseline and stable Postop Assessment: no apparent nausea or vomiting Anesthetic complications: no   No notable events documented.   Last Vitals:  Vitals:   06/13/21 1200 06/13/21 1220  BP: (!) 116/49 (!) 93/37  Pulse: 65 68  Resp: 15 16  Temp:    SpO2: 100% 100%    Last Pain:  Vitals:   06/13/21 1200  TempSrc:   PainSc: 0-No pain                 Genelle Economou M Daissy Yerian

## 2021-06-13 NOTE — Transfer of Care (Signed)
Immediate Anesthesia Transfer of Care Note  Patient: Denise Sharp  Procedure(s) Performed: Procedure(s): ESOPHAGOGASTRODUODENOSCOPY (EGD) WITH PROPOFOL (N/A)  Patient Location: PACU and Endoscopy Unit  Anesthesia Type:General  Level of Consciousness: sedated  Airway & Oxygen Therapy: Patient Spontanous Breathing and Patient connected to nasal cannula oxygen  Post-op Assessment: Report given to RN and Post -op Vital signs reviewed and stable  Post vital signs: Reviewed and stable  Last Vitals:  Vitals:   06/13/21 1018 06/13/21 1133  BP: 123/64 96/65  Pulse: 68   Resp: 18   Temp: (!) 35.6 C   SpO2: 456%     Complications: No apparent anesthesia complications

## 2021-06-13 NOTE — Interval H&P Note (Signed)
[name], MD [facility] [address], [place], [place] [address] Phone:[phone] Fax : [phone]  Primary Care Physician:  [name], MD Primary Gastroenterologist:  Dr. Allen Norris  Pre-Procedure History & Physical: HPI:  Denise Sharp is a 75 y.o. female is here for an endoscopy.   Past Medical History:  Diagnosis Date   (HFpEF) heart failure with preserved ejection fraction ([place])    a. 12/2019 Echo: EF 65-70%, Gr2 DD. No significant valvular dzs.   Abnormality of gait 03/25/2013   Adrenal mass, left ([place])    Anemia    iron deficient post  2 unit txfsn 2009, normal endo/colonoscopy by Nerine Pulse   Arthritis    CAD (coronary artery disease) with h/o Atypical Chest Pain    a. 04/1986 Cath ([facility]): nl cors, EF 65%; b. 07/2012 Cath [facility]): Diff minor irregs; c. 07/2016 MV [facility]): "Equivocal"; d. 08/2016 Cardiac CT Ca2+ score [facility]): Ca2+ 6546; e. 05/2019 MV: No ischemia. EF 75%; f. 12/2019 PCI: LM nl, LAD 65ost (3.5x12 Resolute DES), 69m (2.75x12 Resolute DES),LCX/RCA nl; g. 08/2020 Cath: patent LAD stents, RCA 40ost, elev RH pressures, EF >65%.   Cervical spinal stenosis 1994   due to trauma to back ([employer] accident), has intermittent paralysis and parasthesias   Cervicogenic headache 03/23/2014   Depression    Dizziness    chronic dizziness   DJD (degenerative joint disease)    a. Chronic R shoulder pain pending R shoulder replacement 07/2017.   Esophageal stenosis March 2011   with transietn outlet obstruction by food, cleared by EGD    Family history of adverse reaction to anesthesia    daughter PONV   Gastric bypass status for obesity    Headache(784.0)    Hypertension    IBS (irritable bowel syndrome)    Left bundle branch block (LBBB)    a. Intermittently present - likely rate related. - patient denies   Obesity    Obstructive sleep apnea    using CPAP   Polyneuropathy in diabetes(357.2) 03/25/2013   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/09/2021   Right V1 distribution    Restless leg syndrome    Rotator cuff arthropathy, right 08/13/2017   Syncope and collapse 03/12/2014   Type II diabetes mellitus ([place])     Past Surgical History:  Procedure Laterality Date   ABDOMINAL HYSTERECTOMY     APPENDECTOMY     BACK SURGERY     CARDIAC CATHETERIZATION     COLONOSCOPY     CORONARY ANGIOPLASTY     CORONARY STENT INTERVENTION N/A 01/04/2020   Procedure: CORONARY STENT INTERVENTION;  Surgeon: [name], MD;  Location: [place] [facility];  Service: Cardiovascular;  Laterality: N/A;  LAD    DIAPHRAGMATIC HERNIA REPAIR  2015   ESOPHAGEAL DILATION     multiple   ESOPHAGOGASTRODUODENOSCOPY     ESOPHAGOGASTRODUODENOSCOPY (EGD) WITH PROPOFOL N/A 05/11/2021   Procedure: ESOPHAGOGASTRODUODENOSCOPY (EGD) WITH PROPOFOL;  Surgeon: [name], MD;  Location: [facility];  Service: Endoscopy;  Laterality: N/A;   GALLBLADDER SURGERY  1976   GALLBLADDER SURGERY  resection   GASTRIC BYPASS     GASTRIC BYPASS  2000, 2005   Dr. [name] IMPLANT PLACEMENT  10/2011   [place]   JOINT REPLACEMENT  2007   bilateral knee. [name],  [name]   LEFT HEART CATH AND CORS/GRAFTS ANGIOGRAPHY N/A 12/31/2019   Procedure: LEFT HEART CATH AND CORS/GRAFTS ANGIOGRAPHY;  Surgeon: [name], MD;  Location: [place] [facility];  Service: Cardiovascular;  Laterality: N/A;   PANNICULECTOMY  06/16/2019   PANNICULECTOMY N/A 06/16/2019   Procedure: PANNICULECTOMY;  Surgeon: [name], MD;  Location: [place];  Service: Plastics;  Laterality: N/A;  3 hours, please   REVERSE SHOULDER ARTHROPLASTY Right 08/13/2017   Procedure: REVERSE RIGHT SHOULDER ARTHROPLASTY;  Surgeon: [name], MD;  Location: [place];  Service: Orthopedics;  Laterality: Right;   RIGHT/LEFT HEART CATH AND CORONARY ANGIOGRAPHY N/A 09/06/2020   Procedure: RIGHT/LEFT HEART CATH AND CORONARY ANGIOGRAPHY;  Surgeon: [name], MD;  Location: [place] [facility];  Service: Cardiovascular;   Laterality: N/A;   ROTATOR CUFF REPAIR     right   SPINE SURGERY  1995   [place]   TOTAL ABDOMINAL HYSTERECTOMY W/ BILATERAL SALPINGOOPHORECTOMY  2536   UMBILICAL HERNIA REPAIR  08/11/2015    Prior to Admission medications   Medication Sig Start Date End Date Taking? Authorizing Provider  aspirin EC 81 MG tablet Take 1 tablet (81 mg total) by mouth daily. 01/05/20  Yes Fritzi Mandes, MD  atorvastatin (LIPITOR) 40 MG tablet Take 1 tablet (40 mg total) by mouth daily. 02/17/21  Yes [name], MD  clotrimazole-betamethasone (LOTRISONE) cream Apply 1 application topically 2 (two) times daily as needed.   Yes [provider]  Continuous Blood Gluc Receiver (FREESTYLE LIBRE 2 READER) DEVI Use to check sugar at least TID 09/26/20  Yes [name], MD  Continuous Blood Gluc Sensor (FREESTYLE LIBRE 2 SENSOR) MISC 1 application by Does not apply route every 14 (fourteen) days. 02/17/21  Yes [name], MD  cyanocobalamin (,VITAMIN B-12,) 1000 MCG/ML injection Inject 1 mL (1,000 mcg total) into the muscle every 30 (thirty) days. Inject 1 ml (1000 mcg ) IM weekly x 4,  Then monthly thereafter 02/17/21  Yes [name], MD  dapagliflozin propanediol (FARXIGA) 10 MG TABS tablet Take 1 tablet (10 mg total) by mouth daily. 03/21/21  Yes [name], MD  erythromycin ophthalmic ointment 1 application at bedtime.   Yes [provider]  insulin degludec (TRESIBA FLEXTOUCH) 100 UNIT/ML FlexTouch Pen Inject 10 Units into the skin daily. 06/06/21  Yes [name], MD  Insulin Pen Needle (DROPLET PEN NEEDLES) 31G X 5 MM MISC USE ONE NEEDLE SUBCUTANEOUSLY AS DIRECTED (REMOVE AND DISCARD NEEDLE IN SHARPS CONTAINER IMMEDIATELY AFTER USE) 02/17/21  Yes [name], MD  Lidocaine 4 % PTCH Apply topically as needed (pain).   Yes [provider]  NONFORMULARY OR COMPOUNDED [place]  Combination Pain Cream -  Baclofen 2%, Doxepin 5%, Gabapentin 6%, Topiramate 2%,  Pentoxifylline 3% Apply 1-2 grams to affected area 3-4 times daily Qty. 120 gm 3 refills 10/14/17  Yes Gardiner Barefoot, DPM  nystatin (MYCOSTATIN/NYSTOP) powder Apply 1 g topically 4 (four) times daily as needed (rash).   Yes [provider]  pantoprazole (PROTONIX) 40 MG tablet Take 1 tablet (40 mg total) by mouth daily. 05/25/21  Yes [name], MD  potassium chloride 20 MEQ/15ML (10%) SOLN Take 15 mLs (20 mEq total) by mouth 3 (three) times daily. 05/08/21  Yes End, Harrell Gave, MD  pregabalin (LYRICA) 50 MG capsule Take 1 capsule (50 mg total) by mouth 2 (two) times daily. 03/09/21  Yes Kathrynn Ducking, MD  Semaglutide, 1 MG/DOSE, (OZEMPIC, 1 MG/DOSE,) 4 MG/3ML SOPN Inject 1 mg into the skin once a week. 05/08/21  Yes [name], MD  spironolactone (ALDACTONE) 25 MG tablet Take 0.5 tablets (12.5 mg total) by mouth  daily. 05/08/21 08/06/21 Yes End, Harrell Gave, MD  torsemide (DEMADEX) 20 MG tablet Take 1 tablet (20 mg total) by mouth daily. 05/03/21 08/01/21 Yes End, Harrell Gave, MD  valACYclovir (VALTREX) 500 MG tablet Take 500 mg by mouth 2 (two) times daily. 03/15/21  Yes [provider]  diphenoxylate-atropine (LOMOTIL) 2.5-0.025 MG tablet Take 1 tablet by mouth 4 (four) times daily as needed for diarrhea or loose stools. 02/17/21   [name], MD  fexofenadine (ALLEGRA) 180 MG tablet Take 180 mg by mouth as needed for allergies or rhinitis.    [provider]  hydrocortisone (ANUSOL-HC) 25 MG suppository Place 1 suppository (25 mg total) rectally at bedtime. Patient not taking: Reported on 06/13/2021 05/25/21   [name], MD  nitroGLYCERIN (NITROSTAT) 0.4 MG SL tablet Place 1 tablet (0.4 mg total) under the tongue every 5 (five) minutes as needed for chest pain. 09/23/20   Theora Gianotti, NP  ondansetron (ZOFRAN-ODT) 4 MG disintegrating tablet DISSOLVE ONE TABLET ON TONGUE EVERY 8 HOURS AS NEEDED FOR NAUSEA OR VOMITING 02/24/21   [name],  MD  Polyethyl Glycol-Propyl Glycol (SYSTANE) 0.4-0.3 % GEL ophthalmic gel Place 1 application into both eyes.    [provider]    Allergies as of 05/26/2021 - Review Complete 05/26/2021  Allergen Reaction Noted   Demeclocycline Hives 09/18/2013   Erythromycin Nausea And Vomiting and Other (See Comments) 08/17/2011   Flagyl [metronidazole] Nausea And Vomiting and Other (See Comments) 08/17/2011   Glucophage [metformin hcl] Nausea And Vomiting and Other (See Comments) 08/17/2011   Tetracyclines & related Hives and Rash 03/12/2011   Diovan [valsartan] Nausea Only 08/17/2011   Sulfa antibiotics Rash and Other (See Comments) 09/18/2013   Xanax [alprazolam] Other (See Comments) 08/17/2011    Family History  Problem Relation Age of Onset   Heart disease Father    Hypertension Father    Prostate cancer Father    Stroke Father    Osteoporosis Father    Stroke Mother    Depression Mother    Headache Mother    Heart disease Mother    Thyroid disease Mother    Hypertension Mother    Diabetes Daughter    Heart disease Daughter    Hypertension Daughter    Hypertension Son     Social History   Socioeconomic History   Marital status: Married    Spouse name: [name]    Number of children: 2   Years of education: College    Highest education level: Not on file  Occupational History    Employer: retired  Tobacco Use   Smoking status: Never   Smokeless tobacco: Never  Vaping Use   Vaping Use: Never used  Substance and Sexual Activity   Alcohol use: No   Drug use: Not Currently    Comment: prescribed valium   Sexual activity: Not Currently    Birth control/protection: Post-menopausal, Surgical  Other Topics Concern   Not on file  Social History Narrative   Patient lives at home with husband [name].    Right Handed   Drinks caffeinated tea occasionally   Social Determinants of Health   Financial Resource Strain: Low Risk    Difficulty of Paying Living Expenses: Not  hard at all  Food Insecurity: No Food Insecurity   Worried About Charity fundraiser in the Last Year: Never true   Ran Out of Food in the Last Year: Never true  Transportation Needs: No Transportation Needs   Lack of  Transportation (Medical): No   Lack of Transportation (Non-Medical): No  Physical Activity: Insufficiently Active   Days of Exercise per Week: 2 days   Minutes of Exercise per Session: 40 min  Stress: Stress Concern Present   Feeling of Stress : Rather much  Social Connections: Not on file  Intimate Partner Violence: Not At Risk   Fear of Current or Ex-Partner: No   Emotionally Abused: No   Physically Abused: No   Sexually Abused: No    Review of Systems: See HPI, otherwise negative ROS  Physical Exam: BP 123/64   Pulse 68   Temp (!) 96.1 F (35.6 C) (Temporal)   Resp 18   Ht 5' 4.25" (1.632 m)   Wt 110 kg   SpO2 100%   BMI 41.30 kg/m  General:   Alert,  pleasant and cooperative in NAD Head:  Normocephalic and atraumatic. Neck:  Supple; no masses or thyromegaly. Lungs:  Clear throughout to auscultation.    Heart:  Regular rate and rhythm. Abdomen:  Soft, nontender and nondistended. Normal bowel sounds, without guarding, and without rebound.   Neurologic:  Alert and  oriented x4;  grossly normal neurologically.  Impression/Plan: Denise Sharp is here for an endoscopy to be performed for dysphagia  Risks, benefits, limitations, and alternatives regarding  endoscopy have been reviewed with the patient.  Questions have been answered.  All parties agreeable.   [name], MD  06/13/2021, 11:07 AM

## 2021-06-14 ENCOUNTER — Encounter: Payer: Self-pay | Admitting: Gastroenterology

## 2021-06-14 DIAGNOSIS — E1142 Type 2 diabetes mellitus with diabetic polyneuropathy: Secondary | ICD-10-CM | POA: Diagnosis not present

## 2021-06-20 ENCOUNTER — Other Ambulatory Visit: Payer: Self-pay

## 2021-06-20 ENCOUNTER — Telehealth: Payer: Self-pay | Admitting: Internal Medicine

## 2021-06-20 DIAGNOSIS — E1142 Type 2 diabetes mellitus with diabetic polyneuropathy: Secondary | ICD-10-CM

## 2021-06-20 MED ORDER — TRESIBA FLEXTOUCH 100 UNIT/ML ~~LOC~~ SOPN: 10.0000 [IU] | PEN_INJECTOR | Freq: Every day | SUBCUTANEOUS | 3 refills | Status: DC

## 2021-06-20 NOTE — Telephone Encounter (Signed)
Medication [facility]. Pt is aware.

## 2021-06-20 NOTE — Telephone Encounter (Signed)
Pt is calling in regards to insulin degludec (TRESIBA FLEXTOUCH) 100 UNIT/ML FlexTouch Pen Pt is currently out of town and doesn't have enough insulin in her pen she currently has with her. Pt is currently in [facility] and [facility]. Pt states the pharmacy closes at 9pm and knows that we close at 5 but she is almost completely out.   [facility] Fax: [phone]

## 2021-06-22 NOTE — Telephone Encounter (Signed)
Communication since this message. No mention of reoccurring or UTI symptoms.

## 2021-06-26 ENCOUNTER — Ambulatory Visit: Payer: Medicare Other | Admitting: Pharmacist

## 2021-06-26 ENCOUNTER — Telehealth: Payer: Self-pay | Admitting: Pharmacist

## 2021-06-26 ENCOUNTER — Telehealth: Payer: Medicare Other | Admitting: Gastroenterology

## 2021-06-26 DIAGNOSIS — E1122 Type 2 diabetes mellitus with diabetic chronic kidney disease: Secondary | ICD-10-CM

## 2021-06-26 DIAGNOSIS — E1140 Type 2 diabetes mellitus with diabetic neuropathy, unspecified: Secondary | ICD-10-CM

## 2021-06-26 DIAGNOSIS — E1169 Type 2 diabetes mellitus with other specified complication: Secondary | ICD-10-CM

## 2021-06-26 DIAGNOSIS — N183 Chronic kidney disease, stage 3 unspecified: Secondary | ICD-10-CM

## 2021-06-26 DIAGNOSIS — E1142 Type 2 diabetes mellitus with diabetic polyneuropathy: Secondary | ICD-10-CM

## 2021-06-26 MED ORDER — TRESIBA FLEXTOUCH 100 UNIT/ML ~~LOC~~ SOPN: 8.0000 [IU] | PEN_INJECTOR | Freq: Every day | SUBCUTANEOUS | 3 refills | Status: DC

## 2021-06-26 MED ORDER — OZEMPIC (1 MG/DOSE) 4 MG/3ML ~~LOC~~ SOPN: 0.5000 mg | PEN_INJECTOR | SUBCUTANEOUS | 1 refills | Status: DC

## 2021-06-26 NOTE — Telephone Encounter (Signed)
Received following message in a prior phone note. Called patient. See CCM documentation

## 2021-06-26 NOTE — Telephone Encounter (Signed)
Pt called back stating she is having right sided abdominal pain with increased diarrhea and bloating. More than she normally does. Pt wondering if she is having diverticulitis flare. Would you like to order a CT scan?

## 2021-06-26 NOTE — Telephone Encounter (Signed)
Inbound call from pt requesting a call back stating that she is having stomach issues. She having severe pain. And can not eat. Please advise. Thank you.

## 2021-06-26 NOTE — Telephone Encounter (Signed)
Pt called in regards to BS. Pt states her diabetes is "getting worse." Pt is experiencing a lot of complications with her insulin. Pls give pt a call back on her house phone she is unable to pickup her cellphone calls.   [phone]

## 2021-06-26 NOTE — Telephone Encounter (Signed)
Sharp, Denise   TS   11:32 AM Note Pt called in regards to BS. Pt states her diabetes is "getting worse." Pt is experiencing a lot of complications with her insulin. Pls give pt a call back on her house phone she is unable to pickup her cellphone calls.    [phone]

## 2021-06-26 NOTE — Telephone Encounter (Signed)
See separate phone note.

## 2021-06-26 NOTE — Telephone Encounter (Signed)
LVM for pt to return my call.

## 2021-06-26 NOTE — Chronic Care Management (AMB) (Signed)
**Note Denise-Identified via Obfuscation** Chronic Care Management CCM Pharmacy Note  06/26/2021 Name:  Denise Sharp MRN:  [id number] DOB:  30-Oct-1945  Summary: - Discussed episodes of nausea and hypoglycemia  Recommendations/Changes made from today's visit: - Reduce Ozempic to 0.5 mg weekly. Decrease Tresiba to 8 units daily  Subjective: Denise Sharp is an 75 y.o. year old female who is a primary patient of [place], Aris Everts, Denise Sharp.  The CCM team was consulted for assistance with disease management and care coordination needs.    Engaged with patient by telephone for follow up visit for pharmacy case management and/or care coordination services.   Objective:  Medications Reviewed Today     Reviewed by Denise Balding, Denise Sharp (Registered Nurse) on 06/13/21 at 53  Med List Status: <None>   Medication Order Taking? Sig Documenting Provider Last Dose Status Informant  aspirin EC 81 MG tablet [id number] Yes Take 1 tablet (81 mg total) by mouth daily. Fritzi Mandes, Denise Sharp 06/12/2021 Active Self  atorvastatin (LIPITOR) 40 MG tablet [id number] Yes Take 1 tablet (40 mg total) by mouth daily. Denise Sharp, Denise Sharp 06/12/2021 Active   clotrimazole-betamethasone (LOTRISONE) cream [id number] Yes Apply 1 application topically 2 (two) times daily as needed. Provider, Historical, Denise Sharp 06/12/2021 Active Self  Continuous Blood Gluc Receiver (FREESTYLE LIBRE 2 READER) DEVI [id number] Yes Use to check sugar at least TID Denise Sharp, Denise Sharp 06/12/2021 Active Self  Continuous Blood Gluc Sensor (FREESTYLE LIBRE 2 SENSOR) MISC [id number] Yes 1 application by Does not apply route every 14 (fourteen) days. Denise Sharp, Denise Sharp 06/12/2021 Active   cyanocobalamin (,VITAMIN B-12,) 1000 MCG/ML injection [id number] Yes Inject 1 mL (1,000 mcg total) into the muscle every 30 (thirty) days. Inject 1 ml (1000 mcg ) IM weekly x 4,  Then monthly thereafter Denise Sharp, Denise Sharp Past Month Active   dapagliflozin propanediol (FARXIGA) 10 MG TABS tablet [id number] Yes Take 1  tablet (10 mg total) by mouth daily. Denise Sharp, Denise Sharp 06/12/2021 Active   diphenoxylate-atropine (LOMOTIL) 2.5-0.025 MG tablet [id number] No Take 1 tablet by mouth 4 (four) times daily as needed for diarrhea or loose stools. Denise Sharp, Denise Sharp prn Active   erythromycin ophthalmic ointment [id number] Yes 1 application at bedtime. Provider, Historical, Denise Sharp 06/12/2021 Active   fexofenadine (ALLEGRA) 180 MG tablet [id number] No Take 180 mg by mouth as needed for allergies or rhinitis. Provider, Historical, Denise Sharp prn Active   hydrocortisone (ANUSOL-HC) 25 MG suppository [id number] No Place 1 suppository (25 mg total) rectally at bedtime.  Patient not taking: Reported on 06/13/2021   Denise Lame, Denise Sharp Completed Course Active   insulin degludec (TRESIBA FLEXTOUCH) 100 UNIT/ML FlexTouch Pen [id number] Yes Inject 10 Units into the skin daily. Denise Sharp, Denise Sharp 06/12/2021 Active   Insulin Pen Needle (DROPLET PEN NEEDLES) 31G X 5 MM MISC [id number] Yes USE ONE NEEDLE SUBCUTANEOUSLY AS DIRECTED (REMOVE AND DISCARD NEEDLE IN SHARPS CONTAINER IMMEDIATELY AFTER USE) Denise Sharp, Denise Sharp 06/12/2021 Active   Lidocaine 4 % PTCH [id number] Yes Apply topically as needed (pain). Provider, Historical, Denise Sharp 06/12/2021 Active   nitroGLYCERIN (NITROSTAT) 0.4 MG SL tablet [id number] No Place 1 tablet (0.4 mg total) under the tongue every 5 (five) minutes as needed for chest pain. Theora Gianotti, NP prn Active   NONFORMULARY OR COMPOUNDED Denise Sharp [id number] Yes [facility]  Combination Pain Cream -  Baclofen 2%, Doxepin 5%, Gabapentin 6%, Topiramate 2%, Pentoxifylline 3% Apply 1-2 grams to affected area 3-4 times daily Qty. 120 gm 3 refills Denise Sharp,  Denise Sharp, Denise Sharp 06/12/2021 Active Self           Med Note (NEWCOMER MCCLAIN, BRANDY L   Fri Sep 02, 2020  2:40 PM)    nystatin (MYCOSTATIN/NYSTOP) powder [id number] Yes Apply 1 g topically 4 (four) times daily as needed (rash). Provider, Historical, Denise Sharp 06/12/2021 Active Self   ondansetron (ZOFRAN-ODT) 4 MG disintegrating tablet [id number] No DISSOLVE ONE TABLET ON TONGUE EVERY 8 HOURS AS NEEDED FOR NAUSEA OR VOMITING Denise Sharp, Denise Sharp prn Active   pantoprazole (PROTONIX) 40 MG tablet [id number] Yes Take 1 tablet (40 mg total) by mouth daily. Denise Lame, Denise Sharp 06/12/2021 Active   Polyethyl Glycol-Propyl Glycol (SYSTANE) 0.4-0.3 % GEL ophthalmic gel [id number]  Place 1 application into both eyes. Provider, Historical, Denise Sharp  Active   potassium chloride 20 MEQ/15ML (10%) SOLN [id number] Yes Take 15 mLs (20 mEq total) by mouth 3 (three) times daily. End, Harrell Gave, Denise Sharp 06/12/2021 Active   pregabalin (LYRICA) 50 MG capsule [id number] Yes Take 1 capsule (50 mg total) by mouth 2 (two) times daily. Denise Ducking, Denise Sharp 06/12/2021 Active   Semaglutide, 1 MG/DOSE, (OZEMPIC, 1 MG/DOSE,) 4 MG/3ML SOPN [id number] Yes Inject 1 mg into the skin once a week. Denise Sharp, Denise Sharp Past Month Active            Med Note Denise Sharp Jun 06, 2021  1:59 PM) Taking 0.5 mg weekly  spironolactone (ALDACTONE) 25 MG tablet [id number] Yes Take 0.5 tablets (12.5 mg total) by mouth daily. [name], Denise Sharp 06/12/2021 Active   torsemide (DEMADEX) 20 MG tablet [id number] Yes Take 1 tablet (20 mg total) by mouth daily. [name], Denise Sharp 06/12/2021 Active   valACYclovir (VALTREX) 500 MG tablet [id number] Yes Take 500 mg by mouth 2 (two) times daily. Provider, Historical, Denise Sharp 06/12/2021 Active   Med List Note Denise Sharp, [place] T, CPhT 07/22/17 1307): CPAP            Pertinent Labs:   Lab Results  Component Value Date   HGBA1C 8.9 (H) 02/08/2021   Lab Results  Component Value Date   CHOL 134 05/24/2020   HDL 60.40 05/24/2020   LDLCALC 48 05/24/2020   LDLDIRECT 111.0 06/16/2015   TRIG 128.0 05/24/2020   CHOLHDL 2 05/24/2020   Lab Results  Component Value Date   CREATININE 1.26 (H) 05/12/2021   BUN 13 05/12/2021   NA 145 (H) 05/12/2021   K 4.0 05/12/2021   CL 102 05/12/2021    CO2 23 05/12/2021    SDOH:  (Social Determinants of Health) assessments and interventions performed:  SDOH Interventions    Flowsheet Row Most Recent Value  SDOH Interventions   Financial Strain Interventions Intervention Not Indicated       CCM Care Plan  Review of patient past medical history, allergies, medications, health status, including review of consultants reports, laboratory and other test data, was performed as part of comprehensive evaluation and provision of chronic care management services.   Care Plan : Medication Management  Updates made by Denise Sharp, Denise Sharp since 06/26/2021 12:00 AM     Problem: Diabetes, CHF      Long-Range Goal: Disease Progression Prevention   Recent Progress: On track  Priority: High  Note:   Current Barriers:  Unable to achieve control of diabetes   Pharmacist Clinical Goal(s):  Over the next 90 days, patient will achieve control of diabetes as evidenced by improvement in A1c through collaboration with PharmD and provider  Interventions: 1:1 collaboration  with Denise Sharp, Denise Sharp regarding development and update of comprehensive plan of care as evidenced by provider attestation and co-signature Inter-disciplinary care team collaboration (see longitudinal plan of care) Comprehensive medication review performed; medication list updated in electronic medical record  Health Maintenance Yearly diabetic eye exam: due Yearly diabetic foot exam: up to date Urine microalbumin: up to date Yearly influenza vaccination: due Td/Tdap vaccination: up to date Pneumonia vaccination: up to date COVID vaccinations: up to date Shingrix vaccinations: due  Colonoscopy: up to date   Diabetes: Uncontrolled; current treatment: Farxiga 10 mg daily, Tresiba 10 units daily, Ozempic 1 mg weekly   Reports some low readings overnight. Also reports significant nausea. Was unaware that Ozempic could relate to queasiness/nausea.  Hx intolerance to  metformin d/t diarrhea Hx Trulicity - exacerbated diarrhea  Hx glipizide - stopped previously when A1c was well controlled  Hx Januvia - stopped previously when Trulicity was started  Avoiding TZD due to HF Current glucose readings: using Libre 2 CGM Reduce Ozempic to 0.5 mg weekly. Decrease Tresiba to 8 units daily.   Heart Failure, preserved:  Appropriately managed; current treatment:  ARNI/ACEi/ARB: none d/t intolerance  Beta blocker: metoprolol tartrate 12.5 mg BID SGLT2: Farxiga 10 mg daily Mineralocorticoid Receptor Antagonist: spironolactone 12.5 mg daily Diuretic: torsemide 20 mg daily + potassium oral TID  Previously recommended to continue current regimen at this time along with cardiology collaboration.   Hyperlipidemia, secondary ASCVD prevention: Controlled per last lab result; current treatment: atorvastatin 40 mg daily  Current antiplatelet regimen: aspirin 81 mg daily Previously recommended to continue current regimen at this time  Nocturnal "leg cramps", as well as chronic pain More exacerbated recently per patient report. Current regimen: pregabalin 50 mg BID, valacyclovir 500 mg BID established with Dr. Jannifer Franklin neurology.  Previously recommended to continue current regimen at this time.  Chronic Diarrhea, S/p Gastric Bypass, Esophageal "growth" Moderately well managed, given patient history. Current regimen: lomotil 4 tabs daily and loperamide BID Previously recommended to continue current regimen at this time  COPD, allergies:  Well controlled; Current regimen: Anoro 62.5/25 mcg daily, albuterol HFA PRN; fexofenadine 180 mg daily  Previously recommended to continue current regimen at this time.  Patient Goals/Self-Care Activities Over the next 90 days, patient will:  - take medications as prescribed check blood glucose at least three times daily using CGM, document, and provide at future appointments      Plan: Face to Face appointment with care management  team member scheduled for: 4 weeks  Catie Darnelle Maffucci, PharmD, [place], [place] Pharmacist [employer] at [facility] [phone]

## 2021-06-26 NOTE — Patient Instructions (Signed)
Visit Information  Following are the goals we discussed today:  Patient Goals/Self-Care Activities Over the next 90 days, patient will:  - take medications as prescribed check blood glucose at least three times daily using CGM, document, and provide at future appointments        Plan: Face to Face appointment with care management team member scheduled for: 4 weeks   Catie Darnelle Maffucci, PharmD, [place], CPP Clinical Pharmacist [place] at [facility] [phone]   Please call the care guide team at [phone] if you need to cancel or reschedule your appointment.   Patient verbalizes understanding of instructions provided today and agrees to view in [place].

## 2021-06-27 ENCOUNTER — Other Ambulatory Visit: Payer: Self-pay

## 2021-06-27 ENCOUNTER — Ambulatory Visit: Payer: Medicare Other

## 2021-06-27 ENCOUNTER — Telehealth: Payer: Self-pay

## 2021-06-27 ENCOUNTER — Ambulatory Visit
Admission: RE | Admit: 2021-06-27 | Discharge: 2021-06-27 | Disposition: A | Discharge status: Home / Self Care | Payer: Medicare Other | Source: Ambulatory Visit | Attending: Gastroenterology | Admitting: Gastroenterology

## 2021-06-27 DIAGNOSIS — R197 Diarrhea, unspecified: Secondary | ICD-10-CM

## 2021-06-27 DIAGNOSIS — R1084 Generalized abdominal pain: Secondary | ICD-10-CM

## 2021-06-27 DIAGNOSIS — D3500 Benign neoplasm of unspecified adrenal gland: Secondary | ICD-10-CM | POA: Diagnosis not present

## 2021-06-27 DIAGNOSIS — K5732 Diverticulitis of large intestine without perforation or abscess without bleeding: Secondary | ICD-10-CM

## 2021-06-27 DIAGNOSIS — Z9049 Acquired absence of other specified parts of digestive tract: Secondary | ICD-10-CM | POA: Diagnosis not present

## 2021-06-27 DIAGNOSIS — I700 Atherosclerosis of aorta: Secondary | ICD-10-CM | POA: Diagnosis not present

## 2021-06-27 DIAGNOSIS — K5730 Diverticulosis of large intestine without perforation or abscess without bleeding: Secondary | ICD-10-CM | POA: Diagnosis not present

## 2021-06-27 LAB — POCT I-STAT CREATININE: Creatinine, Ser: 1.3 mg/dL — ABNORMAL HIGH (ref 0.44–1.00)

## 2021-06-27 MED ORDER — IOHEXOL 300 MG/ML  SOLN
100.0000 mL | Freq: Once | INTRAMUSCULAR | Status: AC | PRN
  Administered 2021-06-27: 100 mL via INTRAVENOUS

## 2021-06-27 NOTE — Telephone Encounter (Signed)
LVM for pt to return my call.

## 2021-06-27 NOTE — Telephone Encounter (Signed)
-----   Message from [name], MD sent at 06/27/2021  1:28 PM EST ----- Please let the patient know that the CT scan showed no sign of diverticulitis or any abnormality to explain her symptoms.

## 2021-06-27 NOTE — Telephone Encounter (Signed)
Pt notified and CT scan abd/pelvis with contrast has been ordered STAT. Pt is to go now to [facility].

## 2021-06-28 NOTE — Telephone Encounter (Signed)
Inbound call from pt requesting a call back stating that she was returning your call from yesterday.

## 2021-06-28 NOTE — Telephone Encounter (Signed)
Pt notified of CT scan results.

## 2021-07-18 ENCOUNTER — Other Ambulatory Visit: Payer: Self-pay | Admitting: Internal Medicine

## 2021-07-18 ENCOUNTER — Encounter: Payer: Self-pay | Admitting: Internal Medicine

## 2021-07-18 DIAGNOSIS — E113393 Type 2 diabetes mellitus with moderate nonproliferative diabetic retinopathy without macular edema, bilateral: Secondary | ICD-10-CM | POA: Diagnosis not present

## 2021-07-19 ENCOUNTER — Other Ambulatory Visit: Payer: Self-pay

## 2021-07-19 MED ORDER — ATORVASTATIN CALCIUM 40 MG PO TABS
40.0000 mg | ORAL_TABLET | Freq: Every day | ORAL | 0 refills | Status: DC
  Filled 2021-07-19: qty 90, 90d supply, fill #0

## 2021-07-19 MED ORDER — ONDANSETRON 4 MG PO TBDP
4.0000 mg | ORAL_TABLET | Freq: Three times a day (TID) | ORAL | 0 refills | Status: DC | PRN
  Filled 2021-07-19: qty 30, 10d supply, fill #0

## 2021-07-19 MED ORDER — TRAMADOL HCL 50 MG PO TABS
50.0000 mg | ORAL_TABLET | Freq: Three times a day (TID) | ORAL | 0 refills | Status: DC | PRN
Start: 2021-07-19 — End: 2022-02-12

## 2021-07-20 ENCOUNTER — Ambulatory Visit (INDEPENDENT_AMBULATORY_CARE_PROVIDER_SITE_OTHER): Payer: Medicare Other | Admitting: Gastroenterology

## 2021-07-20 ENCOUNTER — Other Ambulatory Visit: Payer: Self-pay

## 2021-07-20 ENCOUNTER — Encounter: Payer: Self-pay | Admitting: Gastroenterology

## 2021-07-20 VITALS — BP 139/70 | HR 77 | Temp 98.1°F | Wt 239.0 lb

## 2021-07-20 DIAGNOSIS — G4733 Obstructive sleep apnea (adult) (pediatric): Secondary | ICD-10-CM | POA: Diagnosis not present

## 2021-07-20 DIAGNOSIS — N1832 Chronic kidney disease, stage 3b: Secondary | ICD-10-CM | POA: Diagnosis not present

## 2021-07-20 DIAGNOSIS — R3914 Feeling of incomplete bladder emptying: Secondary | ICD-10-CM | POA: Diagnosis not present

## 2021-07-20 DIAGNOSIS — D649 Anemia, unspecified: Secondary | ICD-10-CM | POA: Diagnosis not present

## 2021-07-20 DIAGNOSIS — N3281 Overactive bladder: Secondary | ICD-10-CM | POA: Diagnosis not present

## 2021-07-20 DIAGNOSIS — E1129 Type 2 diabetes mellitus with other diabetic kidney complication: Secondary | ICD-10-CM | POA: Diagnosis not present

## 2021-07-20 DIAGNOSIS — E611 Iron deficiency: Secondary | ICD-10-CM | POA: Diagnosis not present

## 2021-07-20 DIAGNOSIS — R809 Proteinuria, unspecified: Secondary | ICD-10-CM | POA: Diagnosis not present

## 2021-07-20 DIAGNOSIS — E1122 Type 2 diabetes mellitus with diabetic chronic kidney disease: Secondary | ICD-10-CM | POA: Diagnosis not present

## 2021-07-20 DIAGNOSIS — R110 Nausea: Secondary | ICD-10-CM

## 2021-07-20 DIAGNOSIS — R609 Edema, unspecified: Secondary | ICD-10-CM | POA: Diagnosis not present

## 2021-07-20 DIAGNOSIS — I509 Heart failure, unspecified: Secondary | ICD-10-CM | POA: Diagnosis not present

## 2021-07-20 DIAGNOSIS — R1084 Generalized abdominal pain: Secondary | ICD-10-CM

## 2021-07-20 DIAGNOSIS — E7800 Pure hypercholesterolemia, unspecified: Secondary | ICD-10-CM | POA: Diagnosis not present

## 2021-07-20 MED ORDER — DICYCLOMINE HCL 20 MG PO TABS: 20.0000 mg | ORAL_TABLET | Freq: Three times a day (TID) | ORAL | 1 refills | Status: DC

## 2021-07-20 NOTE — Progress Notes (Signed)
Primary Care Physician: [name], MD  Primary Gastroenterologist:  Dr. [name]  Chief Complaint  Patient presents with   Follow-up    Pt reports abd bloating and that she is still not able to eat, no appetite    HPI: Denise Sharp is a 76 y.o. female here with a history of seeing me for rectal bleeding and she was started on Anusol suppositories.  The patient also had reported dysphagia and underwent a upper endoscopy that showed:  - Benign-appearing esophageal stenosis. Dilated. - Roux-en-Y gastrojejunostomy with gastrojejunal anastomosis characterized by healthy appearing mucosa. - Normal examined jejunum. - No specimens collected.  Due to the patient's abdominal discomfort and diarrhea the patient underwent a CT scan of the abdomen.  That CT scan showed:  IMPRESSION: 1. No CT findings of the abdomen or pelvis to explain diarrhea. 2. Sigmoid diverticulosis without evidence of acute diverticulitis. 3. Status post Roux type gastric bypass, hiatal hernia repair, cholecystectomy, and hysterectomy.   The patient's last colonoscopy was in 2015 by me and at that time she was having diarrhea and had random colon biopsies taken throughout the colon that did not show any abnormalities. She has been on Ozempic.  Past Medical History:  Diagnosis Date   (HFpEF) heart failure with preserved ejection fraction ([place])    a. 12/2019 Echo: EF 65-70%, Gr2 DD. No significant valvular dzs.   Abnormality of gait 03/25/2013   Adrenal mass, left ([place])    Anemia    iron deficient post  2 unit txfsn 2009, normal endo/colonoscopy by [facility]   Arthritis    CAD (coronary artery disease) with h/o Atypical Chest Pain    a. 04/1986 Cath ([facility]): nl cors, EF 65%; b. 07/2012 Cath [facility]): Diff minor irregs; c. 07/2016 MV [facility]): "Equivocal"; d. 08/2016 Cardiac CT Ca2+ score [facility]): Ca2+ 5361; e. 05/2019 MV: No ischemia. EF 75%; f. 12/2019 PCI: LM nl, LAD 65ost (3.5x12 Resolute DES), 38m (2.75x12  Resolute DES),LCX/RCA nl; g. 08/2020 Cath: patent LAD stents, RCA 40ost, elev RH pressures, EF >65%.   Cervical spinal stenosis 1994   due to trauma to back ([employer] accident), has intermittent paralysis and parasthesias   Cervicogenic headache 03/23/2014   Depression    Dizziness    chronic dizziness   DJD (degenerative joint disease)    a. Chronic R shoulder pain pending R shoulder replacement 07/2017.   Esophageal stenosis March 2011   with transietn outlet obstruction by food, cleared by EGD    Family history of adverse reaction to anesthesia    daughter PONV   Gastric bypass status for obesity    Headache(784.0)    Hypertension    IBS (irritable bowel syndrome)    Left bundle branch block (LBBB)    a. Intermittently present - likely rate related. 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 30 tablet 2   clotrimazole-betamethasone (LOTRISONE) cream Apply 1 application topically 2 (two) times daily as needed.     Continuous Blood Gluc Receiver (FREESTYLE LIBRE 2 READER) DEVI  Use to check sugar at least TID 1 each 1   Continuous Blood Gluc Sensor (FREESTYLE LIBRE 2 SENSOR) MISC 1 application by Does not apply route every 14 (fourteen) days. 2 each 0   cyanocobalamin (,VITAMIN B-12,) 1000 MCG/ML injection Inject 1 mL (1,000 mcg total) into the muscle every 30 (thirty) days. Inject 1 ml (1000 mcg ) IM weekly x 4,  Then monthly thereafter 3 mL 1   dapagliflozin propanediol (FARXIGA) 10 MG TABS tablet Take 1 tablet (10 mg total) by mouth daily. 90 tablet 3   diphenoxylate-atropine (LOMOTIL)  2.5-0.025 MG tablet Take 1 tablet by mouth 4 (four) times daily as needed for diarrhea or loose stools. 60 tablet 5   erythromycin ophthalmic ointment 1 application at bedtime.     fexofenadine (ALLEGRA) 180 MG tablet Take 180 mg by mouth as needed for allergies or rhinitis.     hydrocortisone (ANUSOL-HC) 25 MG suppository Place 1 suppository (25 mg total) rectally at bedtime. 24 suppository 1   insulin degludec (TRESIBA FLEXTOUCH) 100 UNIT/ML FlexTouch Pen Inject 8 Units into the skin daily. 15 mL 3   Insulin Pen Needle (DROPLET PEN NEEDLES) 31G X 5 MM MISC USE ONE NEEDLE SUBCUTANEOUSLY AS DIRECTED (REMOVE AND DISCARD NEEDLE IN SHARPS CONTAINER IMMEDIATELY AFTER USE) 100 each 1   Lidocaine 4 % PTCH Apply topically as needed (pain).     Multiple Vitamins-Minerals (CENTRUM SILVER 50+WOMEN PO) Take by mouth.     nitroGLYCERIN (NITROSTAT) 0.4 MG SL tablet Place 1 tablet (0.4 mg total) under the tongue every 5 (five) minutes as needed for chest pain. 25 tablet 1   NONFORMULARY OR COMPOUNDED ITEM [facility]  Combination Pain Cream -  Baclofen 2%, Doxepin 5%, Gabapentin 6%, Topiramate 2%, Pentoxifylline 3% Apply 1-2 grams to affected area 3-4 times daily Qty. 120 gm 3 refills 1 each 3   nystatin (MYCOSTATIN/NYSTOP) powder Apply 1 g topically 4 (four) times daily as needed (rash).     ondansetron (ZOFRAN-ODT) 4 MG disintegrating tablet Take 1 tablet (4 mg total) by mouth every 8 (eight) hours as needed for nausea or vomiting. 30 tablet 0   pantoprazole (PROTONIX) 40 MG tablet Take 1 tablet (40 mg total) by mouth daily. 30 tablet 0   Polyethyl Glycol-Propyl Glycol (SYSTANE) 0.4-0.3 % GEL ophthalmic gel Place 1 application into both eyes.     potassium chloride 20 MEQ/15ML (10%) SOLN Take 15 mLs (20 mEq total) by mouth 3 (three) times daily. 473 mL 0   pregabalin (LYRICA) 50 MG capsule Take 1 capsule (50 mg total) by mouth 2 (two) times daily. 60 capsule 3   Semaglutide, 1 MG/DOSE, (OZEMPIC, 1  MG/DOSE,) 4 MG/3ML SOPN Inject 0.5 mg into the skin once a week. 9 mL 1   spironolactone (ALDACTONE) 25 MG tablet Take 0.5 tablets (12.5 mg total) by mouth daily. 15 tablet 3   torsemide (DEMADEX) 20 MG tablet Take 1 tablet (20 mg total) by mouth daily. 30 tablet 2   traMADol (ULTRAM) 50 MG tablet Take 1 tablet (50 mg total) by mouth every 8 (eight) hours as needed for up to 5 days. 15 tablet 0   valACYclovir (VALTREX) 500 MG tablet Take 500 mg by mouth 2 (two) times daily.     atorvastatin (LIPITOR) 40 MG tablet Take 1 tablet (40 mg total) by mouth daily. (Patient not taking: Reported on 07/20/2021) 90 tablet 0   No current facility-administered medications for this visit.    Allergies as of 07/20/2021 -  Review Complete 07/20/2021  Allergen Reaction Noted   Demeclocycline Hives 09/18/2013   Erythromycin Nausea And Vomiting and Other (See Comments) 08/17/2011   Flagyl [metronidazole] Nausea And Vomiting and Other (See Comments) 08/17/2011   Glucophage [metformin hcl] Nausea And Vomiting and Other (See Comments) 08/17/2011   Tetracyclines & related Hives and Rash 03/12/2011   Diovan [valsartan] Nausea Only 08/17/2011   Sulfa antibiotics Rash and Other (See Comments) 09/18/2013   Xanax [alprazolam] Other (See Comments) 08/17/2011    ROS:  General: Negative for anorexia, weight loss, fever, chills, fatigue, weakness. ENT: Negative for hoarseness, difficulty swallowing , nasal congestion. CV: Negative for chest pain, angina, palpitations, dyspnea on exertion, peripheral edema.  Respiratory: Negative for dyspnea at rest, dyspnea on exertion, cough, sputum, wheezing.  GI: See history of present illness. GU:  Negative for dysuria, hematuria, urinary incontinence, urinary frequency, nocturnal urination.  Endo: Negative for unusual weight change.    Physical Examination:   BP 139/70    Pulse 77    Temp 98.1 F (36.7 C) (Oral)    Wt 239 lb (108.4 kg)    BMI 40.71 kg/m   General:  Well-nourished, well-developed in no acute distress.  Eyes: No icterus. Conjunctivae pink. Neuro: Alert and oriented x 3.  Grossly intact. Skin: Warm and dry, no jaundice.   Psych: Alert and cooperative, normal mood and affect.  Labs:    Imaging Studies: CT Abdomen Pelvis W Contrast  Result Date: 06/27/2021 CLINICAL DATA:  Diarrhea, diverticulitis, complication suspected, severe abdominal pain EXAM: CT ABDOMEN AND PELVIS WITH CONTRAST TECHNIQUE: Multidetector CT imaging of the abdomen and pelvis was performed using the standard protocol following bolus administration of intravenous contrast. CONTRAST:  151mL OMNIPAQUE IOHEXOL 300 MG/ML SOLN, additional oral enteric contrast COMPARISON:  02/08/2021 FINDINGS: Lower chest: No acute abnormality. Hepatobiliary: No focal liver abnormality is seen. Status post cholecystectomy. No biliary dilatation. Pancreas: Unremarkable. No pancreatic ductal dilatation or surrounding inflammatory changes. Spleen: Normal in size without significant abnormality. Adrenals/Urinary Tract: Stable, benign small left adrenal adenoma (series 2, image 18). Kidneys are normal, without renal calculi, solid lesion, or hydronephrosis. Bladder is unremarkable. 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 Appendix is not clearly visualized and may be surgically absent. No evidence of bowel wall thickening, distention, or inflammatory changes. Sigmoid diverticula. Vascular/Lymphatic: Aortic atherosclerosis. No enlarged abdominal or pelvic lymph nodes. Reproductive: Status post hysterectomy. Other: No abdominal wall hernia or abnormality. No abdominopelvic ascites. Musculoskeletal: No acute or significant osseous findings. IMPRESSION: 1. No CT findings of the abdomen or pelvis to explain diarrhea. 2. Sigmoid diverticulosis without evidence of acute diverticulitis. 3. Status post Roux type gastric bypass, hiatal hernia repair, cholecystectomy, and hysterectomy.  Aortic Atherosclerosis (ICD10-I70.0). Electronically Signed   By: Delanna Ahmadi M.D.   On: 06/27/2021 12:09    Assessment and Plan:   Denise Sharp is a 76 y.o. y/o female who comes in today with a history abdominal pain with a negative upper endoscopy x2 that have been done while the patient has had the abdominal pain.  The patient also has a history of irritable bowel syndrome and reports that she has bloating.  She states that she barely eats anything and gets nauseous with everything she eats but despite this has not lost any significant weight.  The patient will be started on dicyclomine 40 mg 3 times a day before she eats to see if this helps her symptoms.  She will also talk to her primary care provider  about switching her diabetic medication that may be causing her abdominal discomfort.  The patient has been explained the plan and agrees with it.     [name], MD. Marval Regal    Note: This dictation was prepared with Dragon dictation along with smaller phrase technology. Any transcriptional errors that result from this process are unintentional.

## 2021-07-21 ENCOUNTER — Telehealth: Payer: Self-pay | Admitting: Medical

## 2021-07-21 ENCOUNTER — Telehealth: Payer: Self-pay | Admitting: Internal Medicine

## 2021-07-21 MED ORDER — ATORVASTATIN CALCIUM 40 MG PO TABS: 40.0000 mg | ORAL_TABLET | Freq: Every day | ORAL | 1 refills | Status: DC

## 2021-07-21 MED ORDER — ONDANSETRON 4 MG PO TBDP: 4.0000 mg | ORAL_TABLET | Freq: Three times a day (TID) | ORAL | 0 refills | Status: DC | PRN

## 2021-07-21 MED ORDER — PREGABALIN 50 MG PO CAPS: 50.0000 mg | ORAL_CAPSULE | Freq: Two times a day (BID) | ORAL | 3 refills | Status: DC

## 2021-07-21 NOTE — Telephone Encounter (Signed)
Called and spoke with patient, informed patient Lyrica is a medication that was not prescribed by our office. Per patient, her PCP office did call her back and they will send in the prescription for her today. Patient verbalized understanding and call was ended.

## 2021-07-21 NOTE — Telephone Encounter (Signed)
°*  STAT* If patient is at the pharmacy, call can be transferred to refill team.   1. Which medications need to be refilled? (please list name of each medication and dose if known) lyrica 50 mg po BID  2. Which pharmacy/location (including street and city if local pharmacy) is medication to be sent to?[facility] [place]  3. Do they need a 30 day or 90 day supply? [place]

## 2021-07-21 NOTE — Telephone Encounter (Signed)
Refilled: 03/09/2021 Last OV: 02/17/2021 Next OV: not scheduled   All other medications have been sent to correct pharmacy.

## 2021-07-21 NOTE — Telephone Encounter (Signed)
Patient is requesting to speak with Catie. Please call patient @ [phone] or cell # [phone]

## 2021-07-21 NOTE — Addendum Note (Signed)
Addended by: Adair Laundry on: 07/21/2021 02:39 PM   Modules accepted: Orders

## 2021-07-21 NOTE — Telephone Encounter (Signed)
Patient called back in stated that she have already requested a refill for atorvastatin (LIPITOR) 40 MG tablet last week and [facility] still have not recv request. Patient needs 3 months refill but  need an addition prescription  [facility] will take [facility] two or  3 weeks before they would get meds to her and she will out of medication  by next week.[name]

## 2021-07-24 ENCOUNTER — Telehealth: Payer: Self-pay | Admitting: Internal Medicine

## 2021-07-24 NOTE — Telephone Encounter (Signed)
Called patient back. Left message with husband asking her to return my call if she still needs something

## 2021-07-24 NOTE — Telephone Encounter (Signed)
Pt returning call to Catie

## 2021-07-24 NOTE — Telephone Encounter (Signed)
Called patient back. LVM for her to return my call if she still needed something from me

## 2021-07-25 ENCOUNTER — Other Ambulatory Visit: Payer: Self-pay | Admitting: Gastroenterology

## 2021-07-27 ENCOUNTER — Ambulatory Visit (INDEPENDENT_AMBULATORY_CARE_PROVIDER_SITE_OTHER): Payer: Medicare Other

## 2021-07-27 ENCOUNTER — Other Ambulatory Visit: Payer: Self-pay

## 2021-07-27 ENCOUNTER — Ambulatory Visit (INDEPENDENT_AMBULATORY_CARE_PROVIDER_SITE_OTHER): Payer: Medicare Other | Admitting: Pharmacist

## 2021-07-27 DIAGNOSIS — Z23 Encounter for immunization: Secondary | ICD-10-CM

## 2021-07-27 DIAGNOSIS — I200 Unstable angina: Secondary | ICD-10-CM

## 2021-07-27 DIAGNOSIS — E1142 Type 2 diabetes mellitus with diabetic polyneuropathy: Secondary | ICD-10-CM

## 2021-07-27 DIAGNOSIS — I2510 Atherosclerotic heart disease of native coronary artery without angina pectoris: Secondary | ICD-10-CM

## 2021-07-27 MED ORDER — ATORVASTATIN CALCIUM 40 MG PO TABS: 40.0000 mg | ORAL_TABLET | Freq: Every day | ORAL | 1 refills | Status: DC

## 2021-07-27 MED ORDER — NOVOLOG FLEXPEN 100 UNIT/ML ~~LOC~~ SOPN: PEN_INJECTOR | SUBCUTANEOUS | 1 refills | Status: DC

## 2021-07-27 MED ORDER — DAPAGLIFLOZIN PROPANEDIOL 10 MG PO TABS: 10.0000 mg | ORAL_TABLET | Freq: Every day | ORAL | 3 refills | Status: DC

## 2021-07-27 NOTE — Patient Instructions (Addendum)
Continue Tresiba 8 units daily.   Add Novolog per sliding scale with supper.   Inject 10-20 units with supper: If pre-meal glucose 100-150 - inject 4 units, 151-200 - inject 6 units; 201-250 - inject 8 units; 251-300 - inject 10 units, 301-350 - inject 12 units ; 350 > inject 14 units  We recommend the Shingrix (shingles) vaccine series for all over age 76. It should have a $0 copay on all Medicare plans this year. You can pursue this without a prescription at your local pharmacy, or feel free to call our [place] at [facility] at [phone].  Please have your eye doctor send [place] the results of your yearly diabetic eye exam.   Take care!  Catie Darnelle Maffucci, PharmD  Visit Information  Following are the goals we discussed today:  Patient Goals/Self-Care Activities Over the next 90 days, patient will:  - take medications as prescribed check blood glucose at least three times daily using CGM, document, and provide at future appointments        Plan: Face to Face appointment with care management team member scheduled for: 8 weeks   Catie Darnelle Maffucci, PharmD, [place], CPP Clinical Pharmacist [place] at [facility] [phone]   Please call the care guide team at [phone] if you need to cancel or reschedule your appointment.   Print copy of patient instructions, educational materials, and care plan provided in person.

## 2021-07-27 NOTE — Chronic Care Management (AMB) (Signed)
Chronic Care Management CCM Pharmacy Note  07/27/2021 Name:  Denise Sharp MRN:  [id number] DOB:  1945-12-11  Summary: - Patient stopped Ozempic, does note improved GI symptoms  Recommendations/Changes made from today's visit: - Stop Ozempic. Start Novolog per sliding scale with supper.  - Flu shot given today.  - Discussed vaccinations  Subjective: Denise Sharp is an 76 y.o. year old female who is a primary patient of Tullo, Aris Everts, MD.  The CCM team was consulted for assistance with disease management and care coordination needs.    Engaged with patient face to face for follow up visit for pharmacy case management and/or care coordination services.   Objective:  Medications Reviewed Today     Reviewed by De Hollingshead, RPH-CPP (Pharmacist) on 07/27/21 at [place] List Status: <None>   Medication Order Taking? Sig Documenting Provider Last Dose Status Informant  aspirin EC 81 MG tablet [id number] Yes Take 1 tablet (81 mg total) by mouth daily. Fritzi Mandes, MD Taking Active Self  atorvastatin (LIPITOR) 40 MG tablet [id number]  Take 1 tablet (40 mg total) by mouth daily. [name], MD  Active   clotrimazole-betamethasone Donalynn Furlong) cream [id number]  Apply 1 application topically 2 (two) times daily as needed. [provider]  Active Self  Continuous Blood Gluc Receiver (FREESTYLE LIBRE 2 READER) DEVI [id number] Yes Use to check sugar at least TID [name], MD Taking Active Self  Continuous Blood Gluc Sensor (FREESTYLE LIBRE 2 SENSOR) MISC [id number] Yes 1 application by Does not apply route every 14 (fourteen) days. [name], MD Taking Active   cyanocobalamin (,VITAMIN B-12,) 1000 MCG/ML injection [id number] Yes Inject 1 mL (1,000 mcg total) into the muscle every 30 (thirty) days. Inject 1 ml (1000 mcg ) IM weekly x 4,  Then monthly thereafter [name], MD Taking Active   dapagliflozin propanediol (FARXIGA) 10 MG TABS tablet [id number]   Take 1 tablet (10 mg total) by mouth daily. [name], MD  Active   dicyclomine (BENTYL) 20 MG tablet [id number] Yes Take 1 tablet (20 mg total) by mouth 3 (three) times daily before meals. [name], MD Taking Active   diphenoxylate-atropine (LOMOTIL) 2.5-0.025 MG tablet [id number] Yes Take 1 tablet by mouth 4 (four) times daily as needed for diarrhea or loose stools. [name], MD Taking Active   fexofenadine [facility]) 180 MG tablet [id number] Yes Take 180 mg by mouth as needed for allergies or rhinitis. [provider] Taking Active   hydrocortisone (ANUSOL-HC) 25 MG suppository [id number] Yes Place 1 suppository (25 mg total) rectally at bedtime. [name], MD Taking Active   insulin degludec (TRESIBA FLEXTOUCH) 100 UNIT/ML FlexTouch Pen [id number] Yes Inject 8 Units into the skin daily. [name], MD Taking Active   Insulin Pen Needle (DROPLET PEN NEEDLES) 31G X 5 MM MISC [id number] Yes USE ONE NEEDLE SUBCUTANEOUSLY AS DIRECTED (REMOVE AND DISCARD NEEDLE IN SHARPS CONTAINER IMMEDIATELY AFTER USE) [name], MD Taking Active   Lidocaine 4 % PTCH [id number] No Apply topically as needed (pain).  Patient not taking: Reported on 07/27/2021   [provider] Not Taking Active   Multiple Vitamins-Minerals (CENTRUM SILVER 50+WOMEN PO) [id number] Yes Take by mouth. [provider] Taking Active   nitroGLYCERIN (NITROSTAT) 0.4 MG SL tablet [id number] No Place 1 tablet (0.4 mg total) under the tongue every 5 (five) minutes as needed for chest pain.  Patient not taking: Reported on 07/27/2021  Theora Gianotti, NP Not Taking Active   NONFORMULARY OR COMPOUNDED ITEM [id number]  [place]  Combination Pain Cream -  Baclofen 2%, Doxepin 5%, Gabapentin 6%, Topiramate 2%, Pentoxifylline 3% Apply 1-2 grams to affected area 3-4 times daily Qty. 120 gm 3 refills Gardiner Barefoot, DPM  Active Self           Med Note [facility], BRANDY L   Fri  Sep 02, 2020  2:40 PM)    nystatin (MYCOSTATIN/NYSTOP) powder [id number] Yes Apply 1 g topically 4 (four) times daily as needed (rash). [provider] Taking Active Self  ondansetron (ZOFRAN-ODT) 4 MG disintegrating tablet [id number] Yes Take 1 tablet (4 mg total) by mouth every 8 (eight) hours as needed for nausea or vomiting. [name], MD Taking Active   pantoprazole (PROTONIX) 40 MG tablet [id number] Yes TAKE 1 TABLET BY MOUTH EVERY DAY [name], MD Taking Active   potassium chloride SA (KLOR-CON M) 20 MEQ tablet [id number] Yes Take 20 mEq by mouth once. [provider] Taking Active   pregabalin (LYRICA) 50 MG capsule [id number] Yes Take 1 capsule (50 mg total) by mouth 2 (two) times daily. [name], MD Taking Active   spironolactone (ALDACTONE) 25 MG tablet [id number] Yes Take 0.5 tablets (12.5 mg total) by mouth daily. End, Harrell Gave, MD Taking Active   torsemide (DEMADEX) 20 MG tablet [id number] Yes Take 1 tablet (20 mg total) by mouth daily.  Patient taking differently: Take 20-40 mg by mouth daily.   [name], MD Taking Active   Med List Note Wynona Canes, CPhT 07/22/17 1307): CPAP            Pertinent Labs:   Lab Results  Component Value Date   HGBA1C 8.9 (H) 02/08/2021   Lab Results  Component Value Date   CHOL 134 05/24/2020   HDL 60.40 05/24/2020   LDLCALC 48 05/24/2020   LDLDIRECT 111.0 06/16/2015   TRIG 128.0 05/24/2020   CHOLHDL 2 05/24/2020   Lab Results  Component Value Date   CREATININE 1.30 (H) 06/27/2021   BUN 13 05/12/2021   NA 145 (H) 05/12/2021   K 4.0 05/12/2021   CL 102 05/12/2021   CO2 23 05/12/2021    SDOH:  (Social Determinants of Health) assessments and interventions performed:    [place]  Review of patient past medical history, allergies, medications, health status, including review of consultants reports, laboratory and other test data, was performed as part of comprehensive evaluation  and provision of chronic care management services.   Care Plan : Medication Management  Updates made by De Hollingshead, RPH-CPP since 07/27/2021 12:00 AM     Problem: Diabetes, CHF      Long-Range Goal: Disease Progression Prevention   Recent Progress: On track  Priority: High  Note:   Current Barriers:  Unable to achieve control of diabetes   Pharmacist Clinical Goal(s):  Over the next 90 days, patient will achieve control of diabetes as evidenced by improvement in A1c through collaboration with PharmD and provider  Interventions: 1:1 collaboration with [name], MD regarding development and update of comprehensive plan of care as evidenced by provider attestation and co-signature Inter-disciplinary care team collaboration (see longitudinal plan of care) Comprehensive medication review performed; medication list updated in electronic medical record  Health Maintenance Yearly diabetic eye exam: due - reports she had done last week Yearly diabetic foot exam: due -  Urine microalbumin: due - ordered  Yearly influenza  vaccination: due - received today Td/Tdap vaccination: up to date Pneumonia vaccination: up to date COVID vaccinations: up to date Shingrix vaccinations: due - discussed today, recommended to receive Colonoscopy: up to date   Diabetes: Uncontrolled; current treatment: Farxiga 10 mg daily, Tresiba 8 units daily, Ozempic 1 mg weekly - patient has discontinued after discussion with Dr. Allen Norris. Decreased appetite, and she reports she feels better without it.  Reports some low readings overnight. Also reports significant nausea. Was unaware that Ozempic could relate to queasiness/nausea.  Hx intolerance to metformin d/t diarrhea Hx Trulicity - exacerbated diarrhea  Hx glipizide - stopped previously when A1c was well controlled  Hx Januvia - stopped previously when Trulicity was started  Avoiding TZD due to HF Hx Ozempic - stopped due to GI discomfort Current  glucose readings: using Libre 2 CGM Date of Download: 07/14/21-07/27/21 % Time CGM is active: 70% Average Glucose: 177 mg/dL Glucose Management Indicator: 7.5  Glucose Variability: 33.1 (goal <36%) Time in Goal:  - Time in range 70-180: 61% - Time above range: 39% - Time below range: 0% Observed patterns: fastings at goal, post prandial elevated, worse after supper.  Stop Ozempic. Continue Tresiba 8 units daily. Restart Novolog with sliding scale: Inject 10-20 units with supper: If pre-meal glucose 100-150 - inject 4 units, 151-200 - inject 6 units; 201-250 - inject 8 units; 251-300 - inject 10 units, 301-350 - inject 12 units ; 350 > inject 14 units  Heart Failure, preserved:  Appropriately managed; current treatment:  ARNI/ACEi/ARB: none d/t intolerance  Beta blocker: unable to tolerate d/t hypotension SGLT2: Farxiga 10 mg daily Mineralocorticoid Receptor Antagonist: spironolactone 12.5 mg daily Diuretic: torsemide 20 mg daily + potassium 20 mEq daily   Requests refill on spironolactone. Reviewed this was prescribed by Dr. Saunders Revel. Will message his office for refill Previously recommended to continue current regimen at this time along with cardiology collaboration.   Hyperlipidemia, secondary ASCVD prevention: Controlled per last lab result; current treatment: atorvastatin 40 mg daily  Current antiplatelet regimen: aspirin 81 mg daily Previously recommended to continue current regimen at this time  Nocturnal "leg cramps", as well as chronic pain More exacerbated recently per patient report. Current regimen: pregabalin 50 mg BID, established with Dr. Jannifer Franklin neurology.  Previously recommended to continue current regimen at this time.  Chronic Diarrhea, S/p Gastric Bypass, Esophageal "growth" Moderately well managed, given patient history. Current regimen: lomotil 4 tabs daily and loperamide BID; omeprazole 20 mg daily, recently started dicyclomine 20 mg daily TID PRN Previously  recommended to continue current regimen at this time  COPD, allergies:  Well controlled; Current regimen: Anoro 62.5/25 mcg daily, albuterol HFA PRN; fexofenadine 180 mg daily  Previously recommended to continue current regimen at this time.  Patient Goals/Self-Care Activities Over the next 90 days, patient will:  - take medications as prescribed check blood glucose at least three times daily using CGM, document, and provide at future appointments      Plan: Face to Face appointment with care management team member scheduled for: 8 weeks  Catie Darnelle Maffucci, PharmD, [place], Alex Pharmacist [employer] at [facility] [phone]

## 2021-07-31 ENCOUNTER — Other Ambulatory Visit: Payer: Self-pay | Admitting: Internal Medicine

## 2021-07-31 MED ORDER — SPIRONOLACTONE 25 MG PO TABS: 12.5000 mg | ORAL_TABLET | Freq: Every day | ORAL | 3 refills | Status: DC

## 2021-08-03 ENCOUNTER — Ambulatory Visit (INDEPENDENT_AMBULATORY_CARE_PROVIDER_SITE_OTHER): Payer: Medicare Other | Admitting: Podiatry

## 2021-08-03 ENCOUNTER — Ambulatory Visit (INDEPENDENT_AMBULATORY_CARE_PROVIDER_SITE_OTHER): Payer: Medicare Other

## 2021-08-03 ENCOUNTER — Other Ambulatory Visit: Payer: Self-pay

## 2021-08-03 ENCOUNTER — Ambulatory Visit: Payer: Medicare Other | Admitting: Podiatry

## 2021-08-03 ENCOUNTER — Encounter: Payer: Self-pay | Admitting: Podiatry

## 2021-08-03 ENCOUNTER — Telehealth: Payer: Self-pay

## 2021-08-03 DIAGNOSIS — L02619 Cutaneous abscess of unspecified foot: Secondary | ICD-10-CM | POA: Insufficient documentation

## 2021-08-03 DIAGNOSIS — L03031 Cellulitis of right toe: Secondary | ICD-10-CM | POA: Diagnosis not present

## 2021-08-03 DIAGNOSIS — I200 Unstable angina: Secondary | ICD-10-CM

## 2021-08-03 DIAGNOSIS — B351 Tinea unguium: Secondary | ICD-10-CM

## 2021-08-03 DIAGNOSIS — D689 Coagulation defect, unspecified: Secondary | ICD-10-CM

## 2021-08-03 DIAGNOSIS — E1149 Type 2 diabetes mellitus with other diabetic neurological complication: Secondary | ICD-10-CM | POA: Diagnosis not present

## 2021-08-03 DIAGNOSIS — M2041 Other hammer toe(s) (acquired), right foot: Secondary | ICD-10-CM

## 2021-08-03 DIAGNOSIS — L02611 Cutaneous abscess of right foot: Secondary | ICD-10-CM

## 2021-08-03 DIAGNOSIS — L03039 Cellulitis of unspecified toe: Secondary | ICD-10-CM | POA: Insufficient documentation

## 2021-08-03 DIAGNOSIS — M2042 Other hammer toe(s) (acquired), left foot: Secondary | ICD-10-CM | POA: Diagnosis not present

## 2021-08-03 DIAGNOSIS — M79676 Pain in unspecified toe(s): Secondary | ICD-10-CM

## 2021-08-03 MED ORDER — DOXYCYCLINE HYCLATE 100 MG PO TABS: 100.0000 mg | ORAL_TABLET | Freq: Two times a day (BID) | ORAL | 0 refills | Status: DC

## 2021-08-03 NOTE — Telephone Encounter (Signed)
Patient left message on surgery line stating she is having issues with her interstim. Left message for pt to call office.

## 2021-08-03 NOTE — Telephone Encounter (Signed)
Patient walked in clinic. She states she believes she needs a new interstim placed. Scheduled pt on Monday to see Dr. Matilde Sprang and Interstim rep if needed. Pt  aware.

## 2021-08-03 NOTE — Progress Notes (Addendum)
This patient returns to my office for at risk foot care.  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  Patient is taking plavix..  This patient is unable to cut nails herself since the patient cannot reach her nails.These nails are painful walking and wearing shoes.  This patient presents for at risk foot care today.  General Appearance  Alert, conversant and in no acute stress.  Vascular  Dorsalis pedis and posterior tibial  pulses are palpable  bilaterally.  Capillary return is within normal limits  bilaterally. Temperature is within normal limits  bilaterally.  Neurologic  Senn-Weinstein monofilament wire test absent   bilaterally. Muscle power within normal limits bilaterally.  Nails Thick disfigured discolored nails with subungual debris  from hallux to fifth toes bilaterally. No evidence of bacterial infection or drainage bilaterally.  Orthopedic  No limitations of motion  feet .  No crepitus or effusions noted.  No bony pathology or digital deformities noted. Charcot foot.  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  Severe red swollen  PIPJ second toe right foot. Overlapping second toe left foot  over third toe left foot.  Skin  normotropic skin with no porokeratosis noted bilaterally.  No signs of infections or ulcers noted.   No evidence of skin break/ulcer second toe right foot.  Lilian Kapur to prescribe doxycycline for possible infecton.   Onychomycosis  Pain in right toes  Pain in left toes  Possible infection/arthritis second toe right.  Consent was obtained for treatment procedures.   Mechanical debridement of nails 1-5  bilaterally performed with a nail nipper.  Filed with dremel without incident. After performing nail care we discussed her second toe right foot.  She has red swollen PIPJ second toe right foot.  X-rays were taken revealing severe arthritis.  Chose to  prescribe doxycycline for possible infection.  RTC 3 months for nail care.  If second toe worsens she need to call and follow up with another doctor. She is to let the toe declare itself.   Return office visit    3 months                  Told patient to return for periodic foot care and evaluation due to potential at risk complications.   Gardiner Barefoot DPM

## 2021-08-07 ENCOUNTER — Encounter: Payer: Self-pay | Admitting: Urology

## 2021-08-07 ENCOUNTER — Other Ambulatory Visit: Payer: Self-pay

## 2021-08-07 ENCOUNTER — Ambulatory Visit (INDEPENDENT_AMBULATORY_CARE_PROVIDER_SITE_OTHER): Payer: Medicare Other | Admitting: Urology

## 2021-08-07 VITALS — BP 148/82 | HR 78 | Ht 64.0 in | Wt 239.0 lb

## 2021-08-07 DIAGNOSIS — N3946 Mixed incontinence: Secondary | ICD-10-CM

## 2021-08-07 DIAGNOSIS — I200 Unstable angina: Secondary | ICD-10-CM | POA: Diagnosis not present

## 2021-08-07 LAB — URINALYSIS, COMPLETE
Bilirubin, UA: NEGATIVE
Glucose, UA: NEGATIVE
Ketones, UA: NEGATIVE
Leukocytes,UA: NEGATIVE
Nitrite, UA: NEGATIVE
RBC, UA: NEGATIVE
Specific Gravity, UA: 1.025 (ref 1.005–1.030)
Urobilinogen, Ur: 0.2 mg/dL (ref 0.2–1.0)
pH, UA: 5 (ref 5.0–7.5)

## 2021-08-07 LAB — MICROSCOPIC EXAMINATION: RBC, Urine: NONE SEEN /hpf (ref 0–2)

## 2021-08-07 NOTE — Progress Notes (Signed)
Patient met with Interstim rep Modesta Messing with [employer]. Colletta Maryland let the patient and MD know the following:  Battery is end of life and pt is in need for MRI compatibility device. Provider and Colletta Maryland did discuss the above as well. Pt will be scheduled for surgery and Cardiac clearance is needed.

## 2021-08-07 NOTE — Progress Notes (Signed)
08/07/2021 9:58 AM   Denise Sharp April 17, 1946 [id number]  Referring provider: [name], MD [place],  [place] [address]  No chief complaint on file.   HPI: Recent hospital discharge.  Frequent falls.  Morbid obesity.  Chronic pain.  Peripheral neuropathy.  Extensive work-up.  Urine culture in May 2022 negative.  CT scan July 2022 normal kidneys   Saw the patient and I believe many years ago in [place].  She had failed multiple medications.  Because of obesity and other factors did not think she was a good candidate for refractory therapies.  It turns out she had InterStim 2015 in [place].  Almost completely dry can hold it for many hours.  She holds it too long she has a bit urge incontinence  Reassess with cystoscopy for hematuria.  InterStim working beautifully and does not need replacement and with this was discussed.    Patient recently had cystoscopy in August 2022 by Dr. Erlene Quan.  She had a lot of erythema and mucus with inflammation of the trigone and debris throughout.  She was treated with Bactrim and it was suggested to have repeat cystoscopy to make sure her erythema had resolved.  Recommended InterStim interrogation.  She did have a positive culture with multidrug resistance and had to be given fosfomycin.  Program was interrogated with nurse visit protocol set.  When she saw [name] in follow-up she was doing well.  Symptoms were much better.   Patient is completely dry.  Clinically not infected.  She is very pleased.  She has a lot of cardiac issues and we discussed her fluid pills    I will see her in 1 year.  If she starts leaking we first need to rule out bladder infection.  She does not need another cystoscopy since the findings were in keeping with a positive culture  Today I saw the patient September 2022.  Last urine culture in August 2022 was positive, In the last many weeks patient started having more urge incontinence.  She  now has high-volume urge incontinence wearing 6-8 pads a day.  She will have flooding foot on the floor syndrome in the middle the night.  She has had some burning off and on for a few weeks.  She has some tea colored urine.  Sometimes the urine is clear.  She has intermittent right lower quadrant and right flank discomfort down to the lower hip.  She is on doxycycline in the last week for a toe infection.  Prior to this after reprogramming last time she could hold urination for 12 hours.  She had a normal CT scan December 2022  According to the patient she still has the device that is not MRI compatible.  It sounds like the battery life was almost up last time it was interrogated.  I reviewed the interrogation note from August 2022.  I could not find specifics.  It appears that she had the IPG change October 25, 2014 and the lead may have been 76 years old at that time.  The patient is hoping to have MRIs.  Patient had device trouble suited by [employer] representative.  She still has minimal battery life left.  She has the new IPG but not the MRI compatible lead.  The device is functioning intermittently.  This corresponds to what what was told during last interrogation   PMH: Past Medical History:  Diagnosis Date   (HFpEF) heart failure with preserved ejection fraction ([place])  a. 12/2019 Echo: EF 65-70%, Gr2 DD. No significant valvular dzs.   Abnormality of gait 03/25/2013   Adrenal mass, left ([place])    Anemia    iron deficient post  2 unit txfsn 2009, normal endo/colonoscopy by [facility]   Arthritis    CAD (coronary artery disease) with h/o Atypical Chest Pain    a. 04/1986 Cath ([facility]): nl cors, EF 65%; b. 07/2012 Cath [facility]): Diff minor irregs; c. 07/2016 MV [facility]): "Equivocal"; d. 08/2016 Cardiac CT Ca2+ score [facility]): Ca2+ 2197; e. 05/2019 MV: No ischemia. EF 75%; f. 12/2019 PCI: LM nl, LAD 65ost (3.5x12 Resolute DES), 12m (2.75x12 Resolute DES),LCX/RCA nl; g. 08/2020 Cath: patent LAD stents, RCA 40ost,  elev RH pressures, EF >65%.   Cervical spinal stenosis 1994   due to trauma to back ([employer] accident), has intermittent paralysis and parasthesias   Cervicogenic headache 03/23/2014   Depression    Dizziness    chronic dizziness   DJD (degenerative joint disease)    a. Chronic R shoulder pain pending R shoulder replacement 07/2017.   Esophageal stenosis March 2011   with transietn outlet obstruction by food, cleared by EGD    Family history of adverse reaction to anesthesia    daughter PONV   Gastric bypass status for obesity    Headache(784.0)    Hypertension    IBS (irritable bowel syndrome)    Left bundle branch block (LBBB)    a. Intermittently present - likely rate related. - patient denies   Obesity    Obstructive sleep apnea    using CPAP   Polyneuropathy in diabetes(357.2) 03/25/2013   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/09/2021   Right V1 distribution   Restless leg syndrome    Rotator cuff arthropathy, right 08/13/2017   Syncope and collapse 03/12/2014   Type II diabetes mellitus ([place])     Surgical History: Past Surgical History:  Procedure Laterality Date   ABDOMINAL HYSTERECTOMY     APPENDECTOMY     BACK SURGERY     CARDIAC CATHETERIZATION     COLONOSCOPY     CORONARY ANGIOPLASTY     CORONARY STENT INTERVENTION N/A 01/04/2020   Procedure: CORONARY STENT INTERVENTION;  Surgeon: [name], MD;  Location: [facility];  Service: Cardiovascular;  Laterality: N/A;  LAD    DIAPHRAGMATIC HERNIA REPAIR  2015   ESOPHAGEAL DILATION     multiple   ESOPHAGOGASTRODUODENOSCOPY     ESOPHAGOGASTRODUODENOSCOPY (EGD) WITH PROPOFOL N/A 05/11/2021   Procedure: ESOPHAGOGASTRODUODENOSCOPY (EGD) WITH PROPOFOL;  Surgeon: [name], MD;  Location: [facility];  Service: Endoscopy;  Laterality: N/A;   ESOPHAGOGASTRODUODENOSCOPY (EGD) WITH PROPOFOL N/A 06/13/2021   Procedure: ESOPHAGOGASTRODUODENOSCOPY (EGD) WITH PROPOFOL;  Surgeon: [name], MD;  Location: [facility];   Service: Endoscopy;  Laterality: N/A;   GALLBLADDER SURGERY  1976   GALLBLADDER SURGERY  resection   GASTRIC BYPASS     GASTRIC BYPASS  2000, 2005   Dr. [name] IMPLANT PLACEMENT  10/2011   [place]   JOINT REPLACEMENT  2007   bilateral knee. [name],  [name]   LEFT HEART CATH AND CORS/GRAFTS ANGIOGRAPHY N/A 12/31/2019   Procedure: LEFT HEART CATH AND CORS/GRAFTS ANGIOGRAPHY;  Surgeon: [name], MD;  Location: [facility];  Service: Cardiovascular;  Laterality: N/A;   PANNICULECTOMY  06/16/2019   PANNICULECTOMY N/A 06/16/2019   Procedure: PANNICULECTOMY;  Surgeon: [name], MD;  Location: [facility];  Service: Plastics;  Laterality: N/A;  3 hours, please  REVERSE SHOULDER ARTHROPLASTY Right 08/13/2017   Procedure: REVERSE RIGHT SHOULDER ARTHROPLASTY;  Surgeon: [name], MD;  Location: [facility];  Service: Orthopedics;  Laterality: Right;   RIGHT/LEFT HEART CATH AND CORONARY ANGIOGRAPHY N/A 09/06/2020   Procedure: RIGHT/LEFT HEART CATH AND CORONARY ANGIOGRAPHY;  Surgeon: [name], MD;  Location: [facility];  Service: Cardiovascular;  Laterality: N/A;   ROTATOR CUFF REPAIR     right   SPINE SURGERY  1995   [place]   TOTAL ABDOMINAL HYSTERECTOMY W/ BILATERAL SALPINGOOPHORECTOMY  4098   UMBILICAL HERNIA REPAIR  08/11/2015    Home Medications:  Allergies as of 08/07/2021       Reactions   Demeclocycline Hives   Erythromycin Nausea And Vomiting, Other (See Comments)   Severe irritable bowel   Flagyl [metronidazole] Nausea And Vomiting, Other (See Comments)   Severe irritable bowel   Glucophage [metformin Hcl] Nausea And Vomiting, Other (See Comments)   "Sick" "I won't take anything that has metformin in it"   Tetracyclines & Related Hives, Rash   Diovan [valsartan] Nausea Only       Sulfa Antibiotics Rash, Other (See Comments)   As child   Xanax [alprazolam] Other (See Comments)   Unknown reaction        Medication List         Accurate as of August 07, 2021  9:58 AM. If you have any questions, ask your nurse or doctor.          aspirin EC 81 MG tablet Take 1 tablet (81 mg total) by mouth daily.   atorvastatin 40 MG tablet Commonly known as: LIPITOR Take 1 tablet (40 mg total) by mouth daily.   CENTRUM SILVER 50+WOMEN PO Take by mouth.   clotrimazole-betamethasone cream Commonly known as: LOTRISONE Apply 1 application topically 2 (two) times daily as needed.   cyanocobalamin 1000 MCG/ML injection Commonly known as: (VITAMIN B-12) Inject 1 mL (1,000 mcg total) into the muscle every 30 (thirty) days. Inject 1 ml (1000 mcg ) IM weekly x 4,  Then monthly thereafter   dapagliflozin propanediol 10 MG Tabs tablet Commonly known as: FARXIGA Take 1 tablet (10 mg total) by mouth daily.   dicyclomine 20 MG tablet Commonly known as: BENTYL Take 1 tablet (20 mg total) by mouth 3 (three) times daily before meals.   diphenoxylate-atropine 2.5-0.025 MG tablet Commonly known as: Lomotil Take 1 tablet by mouth 4 (four) times daily as needed for diarrhea or loose stools.   doxycycline 100 MG tablet Commonly known as: VIBRA-TABS Take 1 tablet (100 mg total) by mouth 2 (two) times daily.   Droplet Pen Needles 31G X 5 MM Misc Generic drug: Insulin Pen Needle USE ONE NEEDLE SUBCUTANEOUSLY AS DIRECTED (REMOVE AND DISCARD NEEDLE IN SHARPS CONTAINER IMMEDIATELY AFTER USE)   fexofenadine 180 MG tablet Commonly known as: ALLEGRA Take 180 mg by mouth as needed for allergies or rhinitis.   FreeStyle Libre 2 Reader Kerrin Mo Use to check sugar at least TID   FreeStyle Libre 2 Sensor Misc 1 application by Does not apply route every 14 (fourteen) days.   hydrocortisone 25 MG suppository Commonly known as: ANUSOL-HC Place 1 suppository (25 mg total) rectally at bedtime.   Lidocaine 4 % Ptch Apply topically as needed (pain).   neomycin-polymyxin b-dexamethasone 3.5-10000-0.1 Susp Commonly known as:  MAXITROL Place 1 drop into the left eye 2 (two) times daily.   nitroGLYCERIN 0.4 MG SL tablet Commonly known as: NITROSTAT Place 1 tablet (0.4 mg  total) under the tongue every 5 (five) minutes as needed for chest pain.   NONFORMULARY OR COMPOUNDED [place]  Combination Pain Cream -  Baclofen 2%, Doxepin 5%, Gabapentin 6%, Topiramate 2%, Pentoxifylline 3% Apply 1-2 grams to affected area 3-4 times daily Qty. 120 gm 3 refills   NovoLOG FlexPen 100 UNIT/ML FlexPen Generic drug: insulin aspart Inject 10-20 units with supper: If pre-meal glucose 100-150 - inject 4 units, 151-200 - inject 6 units; 201-250 - inject 8 units; 251-300 - inject 10 units, 301-350 - inject 12 units ; 350 > inject 14 units   nystatin powder Commonly known as: MYCOSTATIN/NYSTOP Apply 1 g topically 4 (four) times daily as needed (rash).   ondansetron 4 MG disintegrating tablet Commonly known as: ZOFRAN-ODT Take 1 tablet (4 mg total) by mouth every 8 (eight) hours as needed for nausea or vomiting.   pantoprazole 40 MG tablet Commonly known as: PROTONIX TAKE 1 TABLET BY MOUTH EVERY DAY   potassium chloride SA 20 MEQ tablet Commonly known as: KLOR-CON M Take 20 mEq by mouth once.   pregabalin 50 MG capsule Commonly known as: LYRICA Take 1 capsule (50 mg total) by mouth 2 (two) times daily.   spironolactone 25 MG tablet Commonly known as: ALDACTONE Take 0.5 tablets (12.5 mg total) by mouth daily.   torsemide 20 MG tablet Commonly known as: DEMADEX Take 1 tablet (20 mg total) by mouth daily. What changed: how much to take   Tresiba FlexTouch 100 UNIT/ML FlexTouch Pen Generic drug: insulin degludec Inject 8 Units into the skin daily.        Allergies:  Allergies  Allergen Reactions   Demeclocycline Hives   Erythromycin Nausea And Vomiting and Other (See Comments)    Severe irritable bowel   Flagyl [Metronidazole] Nausea And Vomiting and Other (See Comments)    Severe irritable  bowel   Glucophage [Metformin Hcl] Nausea And Vomiting and Other (See Comments)    "Sick" "I won't take anything that has metformin in it"   Tetracyclines & Related Hives and Rash   Diovan [Valsartan] Nausea Only        Sulfa Antibiotics Rash and Other (See Comments)    As child   Xanax [Alprazolam] Other (See Comments)    Unknown reaction    Family History: Family History  Problem Relation Age of Onset   Heart disease Father    Hypertension Father    Prostate cancer Father    Stroke Father    Osteoporosis Father    Stroke Mother    Depression Mother    Headache Mother    Heart disease Mother    Thyroid disease Mother    Hypertension Mother    Diabetes Daughter    Heart disease Daughter    Hypertension Daughter    Hypertension Son     Social History:  reports that she has never smoked. She has never used smokeless tobacco. She reports that she does not currently use drugs. She reports that she does not drink alcohol.  ROS:                                        Physical Exam: There were no vitals taken for this visit.  Constitutional:  Alert and oriented, No acute distress. HEENT: [place] AT, moist mucus membranes.  Trachea midline, no masses.  Laboratory Data: Lab Results  Component Value Date  WBC 13.0 (H) 04/26/2021   HGB 13.7 04/26/2021   HCT 42.0 04/26/2021   MCV 96.8 04/26/2021   PLT 308 04/26/2021    Lab Results  Component Value Date   CREATININE 1.30 (H) 06/27/2021    No results found for: PSA  No results found for: TESTOSTERONE  Lab Results  Component Value Date   HGBA1C 8.9 (H) 02/08/2021    Urinalysis    Component Value Date/Time   COLORURINE YELLOW 02/17/2021 1008   APPEARANCEUR Hazy (A) 02/22/2021 1138   LABSPEC >=1.030 (A) 02/17/2021 1008   LABSPEC 1.006 06/21/2014 1623   PHURINE 5.0 02/17/2021 1008   GLUCOSEU 3+ (A) 02/22/2021 1138   GLUCOSEU 500 (A) 02/17/2021 1008   HGBUR NEGATIVE 02/17/2021 1008    BILIRUBINUR Negative 02/22/2021 1138   BILIRUBINUR Negative 06/21/2014 1623   KETONESUR NEGATIVE 02/17/2021 1008   PROTEINUR Negative 02/22/2021 1138   PROTEINUR NEGATIVE 02/08/2021 1323   UROBILINOGEN 0.2 02/17/2021 1008   NITRITE Negative 02/22/2021 1138   NITRITE NEGATIVE 02/17/2021 1008   LEUKOCYTESUR Negative 02/22/2021 1138   LEUKOCYTESUR SMALL (A) 02/17/2021 1008   LEUKOCYTESUR Negative 06/21/2014 1623    Pertinent Imaging:   Assessment & Plan: Patient may have a bladder infection.  Patient has poorly functioning InterStim.  Battery is over 62 years old.  Lead likely is 76 years old.  Explantation described.  Retained tip and sequelae with MRI described.  Higher infection rates described.  Full template utilized.  Patient understands I cannot promise the same excellent results.  She will need medical clearance and I think she seen her cardiologist in February 2023  I went through the entire template that I normally document in my medical records in [facility].  Call if urine culture is positive  1. Mixed incontinence Patient has nonfunctioning - Urinalysis, Complete   No follow-ups on file.  Reece Packer, MD  [facility] [address] [place], [place] [address] [phone]

## 2021-08-09 ENCOUNTER — Other Ambulatory Visit: Payer: Medicare Other

## 2021-08-10 ENCOUNTER — Other Ambulatory Visit (INDEPENDENT_AMBULATORY_CARE_PROVIDER_SITE_OTHER): Payer: Medicare Other

## 2021-08-10 ENCOUNTER — Other Ambulatory Visit: Payer: Self-pay

## 2021-08-10 DIAGNOSIS — E1142 Type 2 diabetes mellitus with diabetic polyneuropathy: Secondary | ICD-10-CM | POA: Diagnosis not present

## 2021-08-10 DIAGNOSIS — I2510 Atherosclerotic heart disease of native coronary artery without angina pectoris: Secondary | ICD-10-CM

## 2021-08-10 LAB — COMPREHENSIVE METABOLIC PANEL
ALT: 15 U/L (ref 0–35)
AST: 18 U/L (ref 0–37)
Albumin: 3.7 g/dL (ref 3.5–5.2)
Alkaline Phosphatase: 147 U/L — ABNORMAL HIGH (ref 39–117)
BUN: 6 mg/dL (ref 6–23)
CO2: 26 mEq/L (ref 19–32)
Calcium: 9 mg/dL (ref 8.4–10.5)
Chloride: 105 mEq/L (ref 96–112)
Creatinine, Ser: 1.02 mg/dL (ref 0.40–1.20)
GFR: 53.68 mL/min — ABNORMAL LOW (ref >60.00)
Glucose, Bld: 133 mg/dL — ABNORMAL HIGH (ref 70–99)
Potassium: 4 mEq/L (ref 3.5–5.1)
Sodium: 142 mEq/L (ref 135–145)
Total Bilirubin: 0.6 mg/dL (ref 0.2–1.2)
Total Protein: 6.8 g/dL (ref 6.0–8.3)

## 2021-08-10 LAB — LIPID PANEL
Cholesterol: 186 mg/dL (ref 0–200)
HDL: 43.1 mg/dL (ref >39.00)
LDL Cholesterol: 108 mg/dL — ABNORMAL HIGH (ref 0–99)
NonHDL: 143.34
Total CHOL/HDL Ratio: 4
Triglycerides: 175 mg/dL — ABNORMAL HIGH (ref 0.0–149.0)
VLDL: 35 mg/dL (ref 0.0–40.0)

## 2021-08-10 LAB — HEMOGLOBIN A1C: Hgb A1c MFr Bld: 7.8 % — ABNORMAL HIGH (ref 4.6–6.5)

## 2021-08-10 LAB — CULTURE, URINE COMPREHENSIVE

## 2021-08-10 NOTE — Addendum Note (Signed)
Addended by: Leeanne Rio on: 08/10/2021 11:18 AM   Modules accepted: Orders

## 2021-08-11 ENCOUNTER — Encounter: Payer: Self-pay | Admitting: Internal Medicine

## 2021-08-11 ENCOUNTER — Other Ambulatory Visit: Payer: Self-pay

## 2021-08-11 ENCOUNTER — Ambulatory Visit (INDEPENDENT_AMBULATORY_CARE_PROVIDER_SITE_OTHER): Payer: Medicare Other | Admitting: Internal Medicine

## 2021-08-11 ENCOUNTER — Other Ambulatory Visit: Payer: Medicare Other

## 2021-08-11 VITALS — BP 136/84 | HR 83 | Temp 98.1°F | Resp 14 | Ht 64.0 in | Wt 243.4 lb

## 2021-08-11 DIAGNOSIS — F330 Major depressive disorder, recurrent, mild: Secondary | ICD-10-CM | POA: Diagnosis not present

## 2021-08-11 DIAGNOSIS — N183 Chronic kidney disease, stage 3 unspecified: Secondary | ICD-10-CM

## 2021-08-11 DIAGNOSIS — E1122 Type 2 diabetes mellitus with diabetic chronic kidney disease: Secondary | ICD-10-CM

## 2021-08-11 DIAGNOSIS — E1140 Type 2 diabetes mellitus with diabetic neuropathy, unspecified: Secondary | ICD-10-CM | POA: Diagnosis not present

## 2021-08-11 DIAGNOSIS — I200 Unstable angina: Secondary | ICD-10-CM

## 2021-08-11 DIAGNOSIS — E1142 Type 2 diabetes mellitus with diabetic polyneuropathy: Secondary | ICD-10-CM

## 2021-08-11 MED ORDER — SPIRONOLACTONE 25 MG PO TABS: 12.5000 mg | ORAL_TABLET | Freq: Every day | ORAL | 3 refills | Status: DC

## 2021-08-11 MED ORDER — ZOSTER VAC RECOMB ADJUVANTED 50 MCG/0.5ML IM SUSR: 0.5000 mL | Freq: Once | INTRAMUSCULAR | 1 refills | Status: AC

## 2021-08-11 MED ORDER — SERTRALINE HCL 25 MG PO TABS: 25.0000 mg | ORAL_TABLET | Freq: Every day | ORAL | 1 refills | Status: DC

## 2021-08-11 NOTE — Patient Instructions (Addendum)
You can change your spironolactone to a full tablet every other day   Please follow up with Catie  or me in 2 -4 weeks to review your blood sugars prior to your surgery  Margreta Journey will need to request FMLA to protect her job   Trial of sertraline for your anxiety  .  Start with one tablet daily with lunch  You may increase to 2 tablets after 2 weeks if tolerated without side effects  You can start your shingles vaccination series now at your pharmacy .    (it requires a 2nd dose 2 to 6 months after the first one) .  It will cause you to have flu  like symptoms for 2 days

## 2021-08-11 NOTE — Progress Notes (Signed)
Subjective:  Patient ID: Denise Sharp, female    DOB: Oct 07, 1945  Age: 76 y.o. MRN: [id number]  CC: Diagnoses of Diabetic polyneuropathy associated with type 2 diabetes mellitus ([place]), Type 2 diabetes mellitus with diabetic neuropathy, without long-term current use of insulin ([place]), Mild episode of recurrent major depressive disorder ([place]), and CKD stage 3 due to type 2 diabetes mellitus ([place]) were pertinent to this visit.   This visit occurred during the SARS-CoV-2 public health emergency.  Safety protocols were in place, including screening questions prior to the visit, additional usage of staff PPE, and extensive cleaning of exam room while observing appropriate contact time as indicated for disinfecting solutions.    HPI VERENIS NICOSIA presents for  Chief Complaint  Patient presents with   Follow-up    Discuss medications, pt has been under lots of stress due to husband being injured and pt having to take care of herself and him since husband was her caregiver.    1)  recent onset of caregiver fatigue.  Patient dozing in office prior to my entry,  reports fatigue and a feeling of helplessness since her husband can no longer help her due to his orthopedic issues.  Daughter Barney Drain  and son/POA Carl Best have recommended transitioning to assisted living given their progressive medical co morbidities .  Currently the patient needs assistance with transportation,  grocery shopping,  and home care. She reports increased anxiety regarding her current situation and decisions that need to be made.   2) Mixed urinary incontinence:  he  saw urology jan 23,  surgery is planned to replace her Medtronic interstim . [name]  3) T2 DM managed with basal insulin and mealtime insulin using CBG.  Average sugar has been 167  .  Met with Catie last week prior to this week's labs.  Novolog stopped  before breakfast and lunch due to recurrent hypoglycemia.   Pre dinner sliding scale,  8 units [place]  .  Weight gain of 4 lbs noted despite reports of minimal feeds.    Outpatient Medications Prior to Visit  Medication Sig Dispense Refill   aspirin EC 81 MG tablet Take 1 tablet (81 mg total) by mouth daily. 30 tablet 2   atorvastatin (LIPITOR) 40 MG tablet Take 1 tablet (40 mg total) by mouth daily. 90 tablet 1   clotrimazole-betamethasone (LOTRISONE) cream Apply 1 application topically 2 (two) times daily as needed.     Continuous Blood Gluc Receiver (FREESTYLE LIBRE 2 READER) DEVI Use to check sugar at least TID 1 each 1   Continuous Blood Gluc Sensor (FREESTYLE LIBRE 2 SENSOR) MISC 1 application by Does not apply route every 14 (fourteen) days. 2 each 0   cyanocobalamin (,VITAMIN B-12,) 1000 MCG/ML injection Inject 1 mL (1,000 mcg total) into the muscle every 30 (thirty) days. Inject 1 ml (1000 mcg ) IM weekly x 4,  Then monthly thereafter 3 mL 1   dapagliflozin propanediol (FARXIGA) 10 MG TABS tablet Take 1 tablet (10 mg total) by mouth daily. 90 tablet 3   dicyclomine (BENTYL) 20 MG tablet Take 1 tablet (20 mg total) by mouth 3 (three) times daily before meals. 90 tablet 1   diphenoxylate-atropine (LOMOTIL) 2.5-0.025 MG tablet Take 1 tablet by mouth 4 (four) times daily as needed for diarrhea or loose stools. 60 tablet 5   doxycycline (VIBRA-TABS) 100 MG tablet Take 1 tablet (100 mg total) by mouth 2 (two) times daily. 20 tablet 0   fexofenadine (ALLEGRA)  180 MG tablet Take 180 mg by mouth as needed for allergies or rhinitis.     hydrocortisone (ANUSOL-HC) 25 MG suppository Place 1 suppository (25 mg total) rectally at bedtime. 24 suppository 1   insulin aspart (NOVOLOG FLEXPEN) 100 UNIT/ML FlexPen Inject 10-20 units with supper: If pre-meal glucose 100-150 - inject 4 units, 151-200 - inject 6 units; 201-250 - inject 8 units; 251-300 - inject 10 units, 301-350 - inject 12 units ; 350 > inject 14 units 15 mL 1   insulin degludec (TRESIBA FLEXTOUCH) 100 UNIT/ML FlexTouch Pen Inject 8 Units into  the skin daily. 15 mL 3   Insulin Pen Needle (DROPLET PEN NEEDLES) 31G X 5 MM MISC USE ONE NEEDLE SUBCUTANEOUSLY AS DIRECTED (REMOVE AND DISCARD NEEDLE IN SHARPS CONTAINER IMMEDIATELY AFTER USE) 100 each 1   Lidocaine 4 % PTCH Apply topically as needed (pain).     Multiple Vitamins-Minerals (CENTRUM SILVER 50+WOMEN PO) Take by mouth.     neomycin-polymyxin b-dexamethasone (MAXITROL) 3.5-10000-0.1 SUSP Place 1 drop into the left eye 2 (two) times daily.     nitroGLYCERIN (NITROSTAT) 0.4 MG SL tablet Place 1 tablet (0.4 mg total) under the tongue every 5 (five) minutes as needed for chest pain. 25 tablet 1   NONFORMULARY OR COMPOUNDED ITEM [facility]  Combination Pain Cream -  Baclofen 2%, Doxepin 5%, Gabapentin 6%, Topiramate 2%, Pentoxifylline 3% Apply 1-2 grams to affected area 3-4 times daily Qty. 120 gm 3 refills 1 each 3   nystatin (MYCOSTATIN/NYSTOP) powder Apply 1 g topically 4 (four) times daily as needed (rash).     ondansetron (ZOFRAN-ODT) 4 MG disintegrating tablet Take 1 tablet (4 mg total) by mouth every 8 (eight) hours as needed for nausea or vomiting. 30 tablet 0   pantoprazole (PROTONIX) 40 MG tablet TAKE 1 TABLET BY MOUTH EVERY DAY 30 tablet 2   potassium chloride SA (KLOR-CON M) 20 MEQ tablet Take 20 mEq by mouth once.     pregabalin (LYRICA) 50 MG capsule Take 1 capsule (50 mg total) by mouth 2 (two) times daily. 60 capsule 3   spironolactone (ALDACTONE) 25 MG tablet Take 0.5 tablets (12.5 mg total) by mouth daily. 45 tablet 3   torsemide (DEMADEX) 20 MG tablet Take 1 tablet (20 mg total) by mouth daily. (Patient taking differently: Take 20-40 mg by mouth daily.) 30 tablet 2   No facility-administered medications prior to visit.    Review of Systems;  Patient denies headache, fevers, malaise, unintentional weight loss, skin rash, eye pain, sinus congestion and sinus pain, sore throat, dysphagia,  hemoptysis , cough, dyspnea, wheezing, chest pain, palpitations,  orthopnea, edema, abdominal pain, nausea, melena, diarrhea, constipation, flank pain, dysuria, hematuria, urinary  Frequency, nocturia, numbness, tingling, seizures,  Focal weakness, Loss of consciousness,  Tremor, insomnia, depression, anxiety, and suicidal ideation.      Objective:  BP 136/84 (BP Location: Left Arm, Patient Position: Sitting, Cuff Size: Small)    Pulse 83    Temp 98.1 F (36.7 C) (Oral)    Resp 14    Ht _0  (1.626 m)    Wt 243 lb 6.4 oz (110.4 kg)    SpO2 98%    BMI 41.78 kg/m   BP Readings from Last 3 Encounters:  08/11/21 136/84  08/07/21 (!) 148/82  07/20/21 139/70    Wt Readings from Last 3 Encounters:  08/11/21 243 lb 6.4 oz (110.4 kg)  08/07/21 239 lb (108.4 kg)  07/20/21 239 lb (108.4 kg)  General appearance: alert, cooperative and appears stated age Ears: normal TM's and external ear canals both ears Throat: lips, mucosa, and tongue normal; teeth and gums normal Neck: no adenopathy, no carotid bruit, supple, symmetrical, trachea midline and thyroid not enlarged, symmetric, no tenderness/mass/nodules Back: symmetric, no curvature. ROM normal. No CVA tenderness. Lungs: clear to auscultation bilaterally Heart: regular rate and rhythm, S1, S2 normal, no murmur, click, rub or gallop Abdomen: soft, non-tender; bowel sounds normal; no masses,  no organomegaly Pulses: 2+ and symmetric Skin: Skin color, texture, turgor normal. No rashes or lesions Lymph nodes: Cervical, supraclavicular, and axillary nodes normal.  Lab Results  Component Value Date   HGBA1C 7.8 (H) 08/10/2021   HGBA1C 8.9 (H) 02/08/2021   HGBA1C 8.2 (A) 11/15/2020    Lab Results  Component Value Date   CREATININE 1.02 08/10/2021   CREATININE 1.30 (H) 06/27/2021   CREATININE 1.26 (H) 05/12/2021    Lab Results  Component Value Date   WBC 13.0 (H) 04/26/2021   HGB 13.7 04/26/2021   HCT 42.0 04/26/2021   PLT 308 04/26/2021   GLUCOSE 133 (H) 08/10/2021   CHOL 186 08/10/2021    TRIG 175.0 (H) 08/10/2021   HDL 43.10 08/10/2021   LDLDIRECT 111.0 06/16/2015   LDLCALC 108 (H) 08/10/2021   ALT 15 08/10/2021   AST 18 08/10/2021   NA 142 08/10/2021   K 4.0 08/10/2021   CL 105 08/10/2021   CREATININE 1.02 08/10/2021   BUN 6 08/10/2021   CO2 26 08/10/2021   TSH 2.209 02/08/2021   INR 1.1 12/31/2019   HGBA1C 7.8 (H) 08/10/2021   MICROALBUR 2.8 (H) 06/04/2017    CT Abdomen Pelvis W Contrast  Result Date: 06/27/2021 CLINICAL DATA:  Diarrhea, diverticulitis, complication suspected, severe abdominal pain EXAM: CT ABDOMEN AND PELVIS WITH CONTRAST TECHNIQUE: Multidetector CT imaging of the abdomen and pelvis was performed using the standard protocol following bolus administration of intravenous contrast. CONTRAST:  11m OMNIPAQUE IOHEXOL 300 MG/ML SOLN, additional oral enteric contrast COMPARISON:  02/08/2021 FINDINGS: Lower chest: No acute abnormality. Hepatobiliary: No focal liver abnormality is seen. Status post cholecystectomy. No biliary dilatation. Pancreas: Unremarkable. No pancreatic ductal dilatation or surrounding inflammatory changes. Spleen: Normal in size without significant abnormality. Adrenals/Urinary Tract: Stable, benign small left adrenal adenoma (series 2, image 18). Kidneys are normal, without renal calculi, solid lesion, or hydronephrosis. Bladder is unremarkable. 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 Appendix is not clearly visualized and may be surgically absent. No evidence of bowel wall thickening, distention, or inflammatory changes. Sigmoid diverticula. Vascular/Lymphatic: Aortic atherosclerosis. No enlarged abdominal or pelvic lymph nodes. Reproductive: Status post hysterectomy. Other: No abdominal wall hernia or abnormality. No abdominopelvic ascites. Musculoskeletal: No acute or significant osseous findings. IMPRESSION: 1. No CT findings of the abdomen or pelvis to explain diarrhea. 2. Sigmoid diverticulosis  without evidence of acute diverticulitis. 3. Status post Roux type gastric bypass, hiatal hernia repair, cholecystectomy, and hysterectomy. Aortic Atherosclerosis (ICD10-I70.0). Electronically Signed   By: ADelanna AhmadiM.D.   On: 06/27/2021 12:09    Assessment & Plan:   Problem List Items Addressed This Visit     [place]    She has minimal sensation on microfilament test,  And requires continued  use of diabetic shoes to prevent pressure  Ulcers.       Relevant Medications   sertraline (ZOLOFT) 25 MG tablet   Type 2 diabetes mellitus with diabetic neuropathy, unspecified (HCC)    Average  CVG is 167 on current regimen which includes 8 units of Tresiba and a sliding scale of novolin with dinner.       Major depressive disorder with current active episode    Aggravated by declining health .  Resuming therapy with sertraline.       Relevant Medications   sertraline (ZOLOFT) 25 MG tablet   CKD stage 3 due to type 2 diabetes mellitus (HCC)    GFR is stable .  Continue Farxiga,  Avoid NSAIDs.   Lab Results  Component Value Date   CREATININE 1.02 08/10/2021         Relevant Medications   spironolactone (ALDACTONE) 25 MG tablet    I spent 30 minutes dedicated to the care of this patient on the date of this encounter to include pre-visit review of patient's medical history,  most recent imaging studies, Face-to-face time with the patient , and post visit ordering of testing and therapeutics.    Follow-up: Return in about 4 weeks (around 09/08/2021).   [name], MD

## 2021-08-13 NOTE — Assessment & Plan Note (Addendum)
Aggravated by declining health .  Resuming therapy with sertraline.

## 2021-08-13 NOTE — Assessment & Plan Note (Addendum)
Average CVG is 167 on current regimen which includes 8 units of Tresiba and a sliding scale of novolin with dinner.

## 2021-08-13 NOTE — Assessment & Plan Note (Signed)
GFR is stable .  Continue Farxiga,  Avoid NSAIDs.   Lab Results  Component Value Date   CREATININE 1.02 08/10/2021

## 2021-08-13 NOTE — Assessment & Plan Note (Signed)
She has minimal sensation on microfilament test,  And requires continued  use of diabetic shoes to prevent pressure  Ulcers.  

## 2021-08-14 ENCOUNTER — Ambulatory Visit: Payer: Medicare Other | Admitting: Internal Medicine

## 2021-08-15 DIAGNOSIS — I200 Unstable angina: Secondary | ICD-10-CM | POA: Diagnosis not present

## 2021-08-15 DIAGNOSIS — E1142 Type 2 diabetes mellitus with diabetic polyneuropathy: Secondary | ICD-10-CM | POA: Diagnosis not present

## 2021-08-15 DIAGNOSIS — I2510 Atherosclerotic heart disease of native coronary artery without angina pectoris: Secondary | ICD-10-CM

## 2021-08-16 NOTE — Progress Notes (Signed)
Follow-up Outpatient Visit Date: 08/18/2021  Primary Care Provider: [name], MD [place] [address]  Chief Complaint: Urinary problems  HPI:  Denise Sharp is a 76 y.o. female with history of CAD status post PCI to the ostial and mid LAD in the setting of unstable angina in 12/2019, chronic HFpEF, hypertension, type 2 diabetes mellitus, iron deficiency anemia following gastric bypass x2, irritable bowel syndrome, esophageal stenosis, chronic dizziness with gait instability, cervicogenic headache with cervical spine stenosis, obstructive sleep apnea on CPAP, restless leg syndrome, and adrenal mass, who presents for follow-up of coronary artery disease and chronic HFpEF.  She was last seen in our office in mid November by Cadence [place], [place], at which time she was doing fairly well from a heart standpoint.  She was scheduled for esophageal dilation with GI and was also dealing with a rectal tear after a mechanical fall in the setting of hypoglycemia.  No medication changes or additional testing were recommended.  Today, [name] reports ongoing issues with her bladder stimulator device, which needs to be replaced.  She request surgical clearance so that she can proceed with this as soon as possible.  She now has significant urinary frequency to the point where she has not been taking her diuretic for several weeks.  Despite this, she notes only mild intermittent leg edema.  She rarely experiences "mild chest heaviness," which has been stable for at least 6 months.  She has been under more stress recently due to health issues with her husband as well as her own ongoing medical problems, and was recently started on sertraline by [name] to help with anxiety.  [name] has chronic fatigue and exhaustion.  Exertional dyspnea is unchanged.  She has not had any further falls.  She notes occasional low blood pressure but attributes some of this to difficulty eating  a lot of foods because of problems with her dentures.  She denies palpitations.  --------------------------------------------------------------------------------------------------  Past Medical History:  Diagnosis Date   (HFpEF) heart failure with preserved ejection fraction ([place])    a. 12/2019 Echo: EF 65-70%, Gr2 DD. No significant valvular dzs.   Abnormality of gait 03/25/2013   Adrenal mass, left ([place])    Anemia    iron deficient post  2 unit txfsn 2009, normal endo/colonoscopy by [facility]   Arthritis    CAD (coronary artery disease) with h/o Atypical Chest Pain    a. 04/1986 Cath ([facility]): nl cors, EF 65%; b. 07/2012 Cath [facility]): Diff minor irregs; c. 07/2016 MV [facility]): "Equivocal"; d. 08/2016 Cardiac CT Ca2+ score [facility]): Ca2+ 0814; e. 05/2019 MV: No ischemia. EF 75%; f. 12/2019 PCI: LM nl, LAD 65ost (3.5x12 Resolute DES), 20m (2.75x12 Resolute DES),LCX/RCA nl; g. 08/2020 Cath: patent LAD stents, RCA 40ost, elev RH pressures, EF >65%.   Cervical spinal stenosis 1994   due to trauma to back ([employer] accident), has intermittent paralysis and parasthesias   Cervicogenic headache 03/23/2014   Depression    Dizziness    chronic dizziness   DJD (degenerative joint disease)    a. Chronic R shoulder pain pending R shoulder replacement 07/2017.   Esophageal stenosis March 2011   with transietn outlet obstruction by food, cleared by EGD    Family history of adverse reaction to anesthesia    daughter PONV   Gastric bypass status for obesity    Headache(784.0)    Hypertension    IBS (irritable bowel syndrome)    Left bundle  branch block (LBBB)    a. Intermittently present - likely rate related. - patient denies   Obesity    Obstructive sleep apnea    using CPAP   Polyneuropathy in diabetes(357.2) 03/25/2013   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/09/2021   Right V1 distribution   Restless leg syndrome    Rotator cuff arthropathy, right 08/13/2017   Syncope and collapse 03/12/2014   Type II diabetes mellitus ([place])     Past Surgical History:  Procedure Laterality Date   ABDOMINAL HYSTERECTOMY     APPENDECTOMY     BACK SURGERY     CARDIAC CATHETERIZATION     COLONOSCOPY     CORONARY ANGIOPLASTY     CORONARY STENT INTERVENTION N/A 01/04/2020   Procedure: CORONARY STENT INTERVENTION;  Surgeon: [name], MD;  Location: [facility];  Service: Cardiovascular;  Laterality: N/A;  LAD    DIAPHRAGMATIC HERNIA REPAIR  2015   ESOPHAGEAL DILATION     multiple   ESOPHAGOGASTRODUODENOSCOPY     ESOPHAGOGASTRODUODENOSCOPY (EGD) WITH PROPOFOL N/A 05/11/2021   Procedure: ESOPHAGOGASTRODUODENOSCOPY (EGD) WITH PROPOFOL;  Surgeon: [name], MD;  Location: [facility];  Service: Endoscopy;  Laterality: N/A;   ESOPHAGOGASTRODUODENOSCOPY (EGD) WITH PROPOFOL N/A 06/13/2021   Procedure: ESOPHAGOGASTRODUODENOSCOPY (EGD) WITH PROPOFOL;  Surgeon: [name], MD;  Location: [facility];  Service: Endoscopy;  Laterality: N/A;   GALLBLADDER SURGERY  1976   GALLBLADDER SURGERY  resection   GASTRIC BYPASS     GASTRIC BYPASS  2000, 2005   Dr. [name] IMPLANT PLACEMENT  10/2011   [place]   JOINT REPLACEMENT  2007   bilateral knee. [name],  [name]   LEFT HEART CATH AND CORS/GRAFTS ANGIOGRAPHY N/A 12/31/2019   Procedure: LEFT HEART CATH AND CORS/GRAFTS ANGIOGRAPHY;  Surgeon: [name], MD;  Location: [facility];  Service: Cardiovascular;  Laterality: N/A;   PANNICULECTOMY  06/16/2019   PANNICULECTOMY N/A 06/16/2019   Procedure: PANNICULECTOMY;  Surgeon: [name], MD;  Location: [facility];  Service: Plastics;  Laterality: N/A;  3 hours, please   REVERSE SHOULDER ARTHROPLASTY Right 08/13/2017   Procedure: REVERSE RIGHT SHOULDER ARTHROPLASTY;  Surgeon: [name], MD;  Location: [facility];  Service: Orthopedics;  Laterality: Right;   RIGHT/LEFT HEART CATH AND CORONARY ANGIOGRAPHY N/A 09/06/2020   Procedure: RIGHT/LEFT HEART CATH AND CORONARY ANGIOGRAPHY;  Surgeon: [name], MD;  Location: [facility];  Service: Cardiovascular;  Laterality: N/A;   ROTATOR CUFF REPAIR     right   SPINE SURGERY  1995   [place]   TOTAL ABDOMINAL HYSTERECTOMY W/ BILATERAL SALPINGOOPHORECTOMY  0254   UMBILICAL HERNIA REPAIR  08/11/2015    Current Meds  Medication Sig   aspirin EC 81 MG tablet Take 1 tablet (81 mg total) by mouth daily.   atorvastatin (LIPITOR) 40 MG tablet Take 1 tablet (40 mg total) by mouth daily.   clotrimazole-betamethasone (LOTRISONE) cream Apply 1 application topically 2 (two) times daily as needed.   Continuous Blood Gluc Receiver (FREESTYLE LIBRE 2 READER) DEVI Use to check sugar at least TID   Continuous Blood Gluc Sensor (FREESTYLE LIBRE 2 SENSOR) MISC 1 application by Does not apply route every 14 (fourteen) days.   cyanocobalamin (,VITAMIN B-12,) 1000 MCG/ML injection Inject 1 mL (1,000 mcg total) into the muscle every 30 (thirty) days. Inject 1 ml (1000 mcg ) IM weekly x 4,  Then monthly thereafter   dapagliflozin propanediol (FARXIGA) 10 MG TABS tablet Take 1 tablet (10  mg total) by mouth daily.   dicyclomine (BENTYL) 20 MG tablet Take 1 tablet (20 mg total) by mouth 3 (three) times daily before meals.   diphenoxylate-atropine (LOMOTIL) 2.5-0.025 MG tablet Take 1 tablet by mouth 4 (four) times daily as needed for diarrhea or loose stools.   fexofenadine (ALLEGRA) 180 MG tablet Take 180 mg by mouth as needed for allergies or rhinitis.   hydrocortisone (ANUSOL-HC) 25 MG suppository Place 1 suppository (25 mg total) rectally at bedtime.   insulin aspart (NOVOLOG FLEXPEN) 100 UNIT/ML FlexPen Inject 10-20 units with supper: If pre-meal glucose 100-150 - inject 4 units, 151-200 - inject 6 units; 201-250 - inject 8 units; 251-300 - inject 10 units, 301-350 - inject 12 units ; 350 > inject 14 units   insulin degludec (TRESIBA FLEXTOUCH) 100 UNIT/ML FlexTouch Pen Inject 8 Units into the skin daily.   Insulin Pen Needle (DROPLET PEN NEEDLES)  31G X 5 MM MISC USE ONE NEEDLE SUBCUTANEOUSLY AS DIRECTED (REMOVE AND DISCARD NEEDLE IN SHARPS CONTAINER IMMEDIATELY AFTER USE)   Lidocaine 4 % PTCH Apply topically as needed (pain).   Multiple Vitamins-Minerals (CENTRUM SILVER 50+WOMEN PO) Take by mouth.   neomycin-polymyxin b-dexamethasone (MAXITROL) 3.5-10000-0.1 SUSP Place 1 drop into the left eye 2 (two) times daily.   nitroGLYCERIN (NITROSTAT) 0.4 MG SL tablet Place 1 tablet (0.4 mg total) under the tongue every 5 (five) minutes as needed for chest pain.   NONFORMULARY OR COMPOUNDED [place]  Combination Pain Cream -  Baclofen 2%, Doxepin 5%, Gabapentin 6%, Topiramate 2%, Pentoxifylline 3% Apply 1-2 grams to affected area 3-4 times daily Qty. 120 gm 3 refills   nystatin (MYCOSTATIN/NYSTOP) powder Apply 1 g topically 4 (four) times daily as needed (rash).   ondansetron (ZOFRAN-ODT) 4 MG disintegrating tablet Take 1 tablet (4 mg total) by mouth every 8 (eight) hours as needed for nausea or vomiting.   pantoprazole (PROTONIX) 40 MG tablet TAKE 1 TABLET BY MOUTH EVERY DAY   potassium chloride SA (KLOR-CON M) 20 MEQ tablet Take 20 mEq by mouth once.   pregabalin (LYRICA) 50 MG capsule Take 1 capsule (50 mg total) by mouth 2 (two) times daily.   sertraline (ZOLOFT) 25 MG tablet Take 1 tablet (25 mg total) by mouth daily.   spironolactone (ALDACTONE) 25 MG tablet Take 0.5 tablets (12.5 mg total) by mouth daily.   torsemide (DEMADEX) 20 MG tablet Take 1 tablet (20 mg total) by mouth daily.    Allergies: Demeclocycline, Erythromycin, Flagyl [metronidazole], Glucophage [metformin hcl], Tetracyclines & related, Diovan [valsartan], Sulfa antibiotics, and Xanax [alprazolam]  Social History   Tobacco Use   Smoking status: Never   Smokeless tobacco: Never  Vaping Use   Vaping Use: Never used  Substance Use Topics   Alcohol use: No   Drug use: Not Currently    Comment: prescribed valium    Family History  Problem Relation Age  of Onset   Heart disease Father    Hypertension Father    Prostate cancer Father    Stroke Father    Osteoporosis Father    Stroke Mother    Depression Mother    Headache Mother    Heart disease Mother    Thyroid disease Mother    Hypertension Mother    Diabetes Daughter    Heart disease Daughter    Hypertension Daughter    Hypertension Son     Review of Systems: A 12-system review of systems was performed and was negative except as noted  in the HPI.  --------------------------------------------------------------------------------------------------  Physical Exam: BP 122/68 (BP Location: Left Arm, Patient Position: Sitting, Cuff Size: Normal)    Pulse 73    Ht 5' 4.5" (1.638 m)    Wt 240 lb 2 oz (108.9 kg)    BMI 40.58 kg/m   General:  NAD.  Accompanied by her husband. Neck: No obvious JVD, though body habitus limits evaluation. Lungs: Clear to auscultation bilaterally without wheezes or crackles. Heart: Regular rate and rhythm with occasional extrasystoles.  Nomurmurs, rubs, or gallops. Abdomen: Obese, soft, nontender, nondistended. Extremities: Trace pretibial edema.  EKG: Normal sinus rhythm with frequent PACs (trigeminy) and mild QT prolongation.  PACs are more frequent than on prior tracing from 05/03/2021.  Otherwise, no significant interval change.  Lab Results  Component Value Date   WBC 13.0 (H) 04/26/2021   HGB 13.7 04/26/2021   HCT 42.0 04/26/2021   MCV 96.8 04/26/2021   PLT 308 04/26/2021    Lab Results  Component Value Date   NA 142 08/10/2021   K 4.0 08/10/2021   CL 105 08/10/2021   CO2 26 08/10/2021   BUN 6 08/10/2021   CREATININE 1.02 08/10/2021   GLUCOSE 133 (H) 08/10/2021   ALT 15 08/10/2021    Lab Results  Component Value Date   CHOL 186 08/10/2021   HDL 43.10 08/10/2021   LDLCALC 108 (H) 08/10/2021   LDLDIRECT 111.0 06/16/2015   TRIG 175.0 (H) 08/10/2021   CHOLHDL 4 08/10/2021     --------------------------------------------------------------------------------------------------  ASSESSMENT AND PLAN: Coronary artery disease with stable angina: Sporadic chest heaviness is unchanged from prior visits.  Most recent catheterization a year ago showed patent ostial and mid LAD stents.  Continue current medications for secondary prevention.  Defer adding antianginal agents at this time due to intolerances in the past and intermittent hypotension reported by the patient.  Chronic HFpEF: Despite being off torsemide for the last few weeks, Ms. Laffey appears fairly euvolemic on exam with only trace pretibial edema.  Her weight is stable.  I think it is reasonable for her to continue her current agents including dapagliflozin, spironolactone, and torsemide (when she is able to take it).  PACs: More frequent on today's EKG though noted in the past as well.  Recent BMP showed normal potassium.  No further intervention at this time.  Hyperlipidemia associated with type 2 diabetes mellitus: Recent lipid panel notable for suboptimal LDL at 108.  We have agreed to increase atorvastatin to 80 mg daily and to keep working on lifestyle modifications, as tolerated.  Lipid panel and ALT will need to be checked at follow-up in 3 months.  We may need to consider trial of a PCSK9 inhibitor or a ezetimibe in the future if lipids do not improve.  Hypertension: Blood pressure normal today.  Defer medication changes.  Morbid obesity: BMI remains greater than 40 with multiple comorbidities.  Weight loss encouraged through diet and exercise.  Preoperative cardiovascular risk assessment: Ms. Dake reports ongoing problems with her bladder stimulator.  I think it is reasonable for her to undergo revision of her bladder stimulator at her convenience without further cardiac testing or intervention, given that she does not have any unstable cardiac symptoms and this is a low risk procedure from  a cardiovascular standpoint.  I suggest that aspirin 81 mg daily be continued in the perioperative period, if possible, given history of prior stenting of the LAD.  Follow-up: Return to clinic in 3 months.  [name], MD 08/18/2021 8:34  AM

## 2021-08-18 ENCOUNTER — Telehealth: Payer: Self-pay | Admitting: Internal Medicine

## 2021-08-18 ENCOUNTER — Ambulatory Visit (INDEPENDENT_AMBULATORY_CARE_PROVIDER_SITE_OTHER): Payer: Medicare Other | Admitting: Internal Medicine

## 2021-08-18 ENCOUNTER — Encounter: Payer: Self-pay | Admitting: Internal Medicine

## 2021-08-18 ENCOUNTER — Other Ambulatory Visit: Payer: Self-pay

## 2021-08-18 ENCOUNTER — Telehealth: Payer: Self-pay | Admitting: Urology

## 2021-08-18 VITALS — BP 122/68 | HR 73 | Ht 64.5 in | Wt 240.1 lb

## 2021-08-18 DIAGNOSIS — I10 Essential (primary) hypertension: Secondary | ICD-10-CM

## 2021-08-18 DIAGNOSIS — I25118 Atherosclerotic heart disease of native coronary artery with other forms of angina pectoris: Secondary | ICD-10-CM

## 2021-08-18 DIAGNOSIS — I5032 Chronic diastolic (congestive) heart failure: Secondary | ICD-10-CM | POA: Diagnosis not present

## 2021-08-18 DIAGNOSIS — Z01810 Encounter for preprocedural cardiovascular examination: Secondary | ICD-10-CM | POA: Diagnosis not present

## 2021-08-18 DIAGNOSIS — E785 Hyperlipidemia, unspecified: Secondary | ICD-10-CM

## 2021-08-18 DIAGNOSIS — I491 Atrial premature depolarization: Secondary | ICD-10-CM | POA: Diagnosis not present

## 2021-08-18 DIAGNOSIS — E1169 Type 2 diabetes mellitus with other specified complication: Secondary | ICD-10-CM

## 2021-08-18 DIAGNOSIS — I2510 Atherosclerotic heart disease of native coronary artery without angina pectoris: Secondary | ICD-10-CM

## 2021-08-18 MED ORDER — ATORVASTATIN CALCIUM 80 MG PO TABS: 80.0000 mg | ORAL_TABLET | Freq: Every day | ORAL | 3 refills | Status: DC

## 2021-08-18 NOTE — Telephone Encounter (Signed)
Pt called in stating that [name] wrote her a script for the shingrix injection so she can get the injection at her pharmacy. Pt stated that she went to her pharmacy to get the shingrix injection and the pharmacist had advise pt that they don't accept the insurance she has. Pharmacist advise that Pt will be responsible for the cost of the shingrix injection. Pt stated that the pharmacy advise her that the injection cost is $200 or more. Pt would like to know if she can come to the office to get the shingrix injection today at the office. Pt was wondering if the walk in clinic that [name] advise her about can do the shingrix injection. Pt requesting callback

## 2021-08-18 NOTE — Telephone Encounter (Signed)
I have scheduled patient for 03/06. Patient aware.

## 2021-08-18 NOTE — Patient Instructions (Signed)
Medication Instructions:   Your physician has recommended you make the following change in your medication:   INCREASE Lipitor (Atorvastatin) 80 mg daily - You may take 2 tablets of current 40 mg tab for total of 80 mg until you receive new Rx.   *If you need a refill on your cardiac medications before your next appointment, please call your pharmacy*   Lab Work:  None ordered  Testing/Procedures:  None ordered   Follow-Up: At [facility], you and your health needs are our priority.  As part of our continuing mission to provide you with exceptional heart care, we have created designated Provider Care Teams.  These Care Teams include your primary Cardiologist (physician) and Advanced Practice Providers (APPs -  Physician Assistants and Nurse Practitioners) who all work together to provide you with the care you need, when you need it.  We recommend signing up for the patient portal called "MyChart".  Sign up information is provided on this After Visit Summary.  MyChart is used to connect with patients for Virtual Visits (Telemedicine).  Patients are able to view lab/test results, encounter notes, upcoming appointments, etc.  Non-urgent messages can be sent to your provider as well.   To learn more about what you can do with MyChart, go to [email or web address].    Your next appointment:   3 month(s)  The format for your next appointment:   In Person  Provider:   You may see [name], MD or one of the following Advanced Practice Providers on your designated Care Team:   Murray Hodgkins, NP Christell Faith, PA-C Cadence Kathlen M[place], [place]

## 2021-08-18 NOTE — Telephone Encounter (Signed)
Pt stopped by to let Dr. Matilde Sprang know cardiologoy cleared her for surgery and she would like to have the surgery asap. She would like a call  back to let her know when the surgery will be scheduled.

## 2021-08-18 NOTE — Telephone Encounter (Signed)
This is the pt that I spoke with to let her know that we are unable to give the Shingrix vaccine in our office to anyone that has medicare. Pt stated that when she went to the pharmacy to get it they told her that because [employer] will cover the vaccine that she will have to get it at her doctor's office.

## 2021-08-19 ENCOUNTER — Encounter: Payer: Self-pay | Admitting: Internal Medicine

## 2021-08-19 DIAGNOSIS — I491 Atrial premature depolarization: Secondary | ICD-10-CM | POA: Insufficient documentation

## 2021-08-23 ENCOUNTER — Encounter: Payer: Self-pay | Admitting: *Deleted

## 2021-08-24 ENCOUNTER — Encounter: Payer: Self-pay | Admitting: Urgent Care

## 2021-08-24 ENCOUNTER — Other Ambulatory Visit: Payer: Self-pay

## 2021-08-24 ENCOUNTER — Telehealth: Payer: Self-pay

## 2021-08-24 DIAGNOSIS — N3941 Urge incontinence: Secondary | ICD-10-CM

## 2021-08-24 DIAGNOSIS — T83110A Breakdown (mechanical) of urinary electronic stimulator device, initial encounter: Secondary | ICD-10-CM

## 2021-08-24 NOTE — Telephone Encounter (Signed)
I spoke with Mrs. Denise Sharp. We have discussed possible surgery dates and Monday March 6th, 2023 was agreed upon by all parties. Patient given information about surgery date, what to expect pre-operatively and post operatively.   We discussed that a Pre-Admission Testing office will be calling to set up the pre-op visit that will take place prior to surgery, and that these appointments are typically done over the phone with a Pre-Admissions RN.   Informed patient that our office will communicate any additional care to be provided after surgery. Patients questions or concerns were discussed during our call. Advised to call our office should there be any additional information, questions or concerns that arise. Patient verbalized understanding.

## 2021-08-24 NOTE — Progress Notes (Signed)
[facility] Posting Form   Surgery Date/Time: Date: 09/18/2021  Surgeon: Dr. Nicki Reaper MacDiarmid  Surgery Location: Day Surgery  Inpt ( No  )   Outpt (Yes)   Obs ( No  )   Diagnosis: N39.41, T83.110A  -CPT: [id number]  Surgery: Removal of Interstim implant, Stage one and Stage two placement of Interstim with Impedence Check.   Stop Anticoagulations: Yes  Cardiac/Medical/Pulmonary Clearance needed: Yes  Clearance needed from Dr: Saunders Revel  Clearance request sent on: Date: 08/18/2021  *Orders entered into EPIC  Date: 08/24/21   *Case booked in EPIC  Date: 08/18/2021  *Notified pt of Surgery: Date: 08/18/2021  *Placed into Prior Authorization Work Fabio Bering Date: 08/24/21   Assistant/laser/rep:Yes, Colletta [place] [employer] Rep will be present.

## 2021-08-29 ENCOUNTER — Inpatient Hospital Stay: Payer: Medicare Other

## 2021-08-29 ENCOUNTER — Inpatient Hospital Stay: Payer: Medicare Other | Admitting: Nurse Practitioner

## 2021-08-31 ENCOUNTER — Ambulatory Visit: Payer: Medicare Other | Admitting: Internal Medicine

## 2021-09-01 ENCOUNTER — Ambulatory Visit: Payer: Medicare Other | Admitting: Medical

## 2021-09-01 DIAGNOSIS — Z20822 Contact with and (suspected) exposure to covid-19: Secondary | ICD-10-CM | POA: Diagnosis not present

## 2021-09-05 ENCOUNTER — Inpatient Hospital Stay: Payer: Medicare Other

## 2021-09-05 ENCOUNTER — Inpatient Hospital Stay: Payer: Medicare Other | Attending: Nurse Practitioner | Admitting: Nurse Practitioner

## 2021-09-05 ENCOUNTER — Encounter: Payer: Self-pay | Admitting: Nurse Practitioner

## 2021-09-05 ENCOUNTER — Other Ambulatory Visit: Payer: Self-pay

## 2021-09-05 VITALS — BP 142/58 | HR 64 | Temp 98.7°F | Resp 19 | Wt 230.1 lb

## 2021-09-05 DIAGNOSIS — G4733 Obstructive sleep apnea (adult) (pediatric): Secondary | ICD-10-CM | POA: Diagnosis not present

## 2021-09-05 DIAGNOSIS — E611 Iron deficiency: Secondary | ICD-10-CM | POA: Diagnosis not present

## 2021-09-05 DIAGNOSIS — I2510 Atherosclerotic heart disease of native coronary artery without angina pectoris: Secondary | ICD-10-CM | POA: Insufficient documentation

## 2021-09-05 DIAGNOSIS — Z794 Long term (current) use of insulin: Secondary | ICD-10-CM | POA: Insufficient documentation

## 2021-09-05 DIAGNOSIS — N183 Chronic kidney disease, stage 3 unspecified: Secondary | ICD-10-CM | POA: Insufficient documentation

## 2021-09-05 DIAGNOSIS — E538 Deficiency of other specified B group vitamins: Secondary | ICD-10-CM | POA: Diagnosis not present

## 2021-09-05 DIAGNOSIS — Z7982 Long term (current) use of aspirin: Secondary | ICD-10-CM | POA: Diagnosis not present

## 2021-09-05 DIAGNOSIS — E1122 Type 2 diabetes mellitus with diabetic chronic kidney disease: Secondary | ICD-10-CM | POA: Diagnosis not present

## 2021-09-05 DIAGNOSIS — I129 Hypertensive chronic kidney disease with stage 1 through stage 4 chronic kidney disease, or unspecified chronic kidney disease: Secondary | ICD-10-CM | POA: Insufficient documentation

## 2021-09-05 DIAGNOSIS — I5032 Chronic diastolic (congestive) heart failure: Secondary | ICD-10-CM | POA: Insufficient documentation

## 2021-09-05 DIAGNOSIS — E1142 Type 2 diabetes mellitus with diabetic polyneuropathy: Secondary | ICD-10-CM | POA: Diagnosis not present

## 2021-09-05 DIAGNOSIS — I509 Heart failure, unspecified: Secondary | ICD-10-CM | POA: Diagnosis not present

## 2021-09-05 DIAGNOSIS — I130 Hypertensive heart and chronic kidney disease with heart failure and stage 1 through stage 4 chronic kidney disease, or unspecified chronic kidney disease: Secondary | ICD-10-CM | POA: Insufficient documentation

## 2021-09-05 DIAGNOSIS — Z79899 Other long term (current) drug therapy: Secondary | ICD-10-CM | POA: Diagnosis not present

## 2021-09-05 DIAGNOSIS — D509 Iron deficiency anemia, unspecified: Secondary | ICD-10-CM | POA: Diagnosis not present

## 2021-09-05 LAB — CBC WITH DIFFERENTIAL/PLATELET
Abs Immature Granulocytes: 0.07 10*3/uL (ref 0.00–0.07)
Basophils Absolute: 0.1 10*3/uL (ref 0.0–0.1)
Basophils Relative: 1 %
Eosinophils Absolute: 0.1 10*3/uL (ref 0.0–0.5)
Eosinophils Relative: 1 %
HCT: 42.3 % (ref 36.0–46.0)
Hemoglobin: 13.5 g/dL (ref 12.0–15.0)
Immature Granulocytes: 1 %
Lymphocytes Relative: 31 %
Lymphs Abs: 3.4 10*3/uL (ref 0.7–4.0)
MCH: 29.8 pg (ref 26.0–34.0)
MCHC: 31.9 g/dL (ref 30.0–36.0)
MCV: 93.4 fL (ref 80.0–100.0)
Monocytes Absolute: 0.6 10*3/uL (ref 0.1–1.0)
Monocytes Relative: 5 %
Neutro Abs: 6.8 10*3/uL (ref 1.7–7.7)
Neutrophils Relative %: 61 %
Platelets: 275 10*3/uL (ref 150–400)
RBC: 4.53 MIL/uL (ref 3.87–5.11)
RDW: 14.4 % (ref 11.5–15.5)
WBC: 11 10*3/uL — ABNORMAL HIGH (ref 4.0–10.5)
nRBC: 0 % (ref 0.0–0.2)

## 2021-09-05 LAB — BASIC METABOLIC PANEL
Anion gap: 9 (ref 5–15)
BUN: 15 mg/dL (ref 8–23)
CO2: 21 mmol/L — ABNORMAL LOW (ref 22–32)
Calcium: 8.7 mg/dL — ABNORMAL LOW (ref 8.9–10.3)
Chloride: 105 mmol/L (ref 98–111)
Creatinine, Ser: 1.21 mg/dL — ABNORMAL HIGH (ref 0.44–1.00)
GFR, Estimated: 47 mL/min — ABNORMAL LOW (ref >60)
Glucose, Bld: 220 mg/dL — ABNORMAL HIGH (ref 70–99)
Potassium: 3.8 mmol/L (ref 3.5–5.1)
Sodium: 135 mmol/L (ref 135–145)

## 2021-09-05 NOTE — Progress Notes (Signed)
[facility] NOTE  Patient Care Team: [name], MD as PCP - General (Internal Medicine) End, Harrell Gave, MD as PCP - Cardiology (Cardiology) Chiropractic, Freddi Che as Referring Physician De Hollingshead, RPH-CPP (Pharmacist) Cammie Sickle, MD as Consulting Physician (Hematology)  CHIEF COMPLAINTS/PURPOSE OF CONSULTATION: Iron Deficiency Anemia   HEMATOLOGY HISTORY  # IRON DEFICIENT ANEMIA [place] TO MALABSORBTION; 2018 IV iron infusion [Dr. Finnegan] EGD/colonoscopy- ?2021 [Dr.Wohl]; NOV 2021-hemoglobin 10.  Iron saturation 5% ferritin 12.  # GASTRIC BYPASS x2 [2005]; CAD/CHF [s/p stent; Dr.End]; CKD stage III [Dr.Korrapati]; poorly controlled diabetes- [Dr.Tullo]   HISTORY OF PRESENTING ILLNESS: Alone.  Walking with a rolling walker. Denise Sharp 76 y.o. 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  She underwent EGD with Dr.  Norris on 06/13/2021, found to have esophageal stenosis which was dilated. She's being followed by urology for incontinence and presence of interstim device.   She reports loss of several friends and family members recently. Her husband may have to undergo open heart surgery. She is disabled and her daughter wants her to come stay with her while husband recovers. Is frustrated at changes in her independence as she and husband age. Her husband is her primary care giver.     Review of Systems  Constitutional:  Positive for malaise/fatigue. Negative for chills, fever and weight loss.  HENT:  Negative for hearing loss, nosebleeds, sore throat and tinnitus.   Eyes:  Negative for blurred vision and double vision.  Respiratory:  Negative for cough, hemoptysis, shortness of breath and wheezing.   Cardiovascular:  Negative for chest pain, palpitations and leg swelling.  Gastrointestinal:  Negative for abdominal pain, blood in  stool, constipation, diarrhea, melena, nausea and vomiting.  Genitourinary:  Negative for dysuria and urgency.  Musculoskeletal:  Negative for back pain, falls, joint pain and myalgias.  Skin:  Negative for itching and rash.  Neurological:  Negative for dizziness, tingling, sensory change, loss of consciousness, weakness and headaches.  Endo/Heme/Allergies:  Negative for environmental allergies. Does not bruise/bleed easily.  Psychiatric/Behavioral:  Negative for depression. The patient is not nervous/anxious and does not have insomnia.    MEDICAL HISTORY:  Past Medical History:  Diagnosis Date   (HFpEF) heart failure with preserved ejection fraction ([place])    a. 12/2019 Echo: EF 65-70%, Gr2 DD. No significant valvular dzs.   Abnormality of gait 03/25/2013   Adrenal mass, left ([place])    Anemia    iron deficient post  2 unit txfsn 2009, normal endo/colonoscopy by Wohl   Arthritis    CAD (coronary artery disease) with h/o Atypical Chest Pain    a. 04/1986 Cath ([facility]): nl cors, EF 65%; b. 07/2012 Cath [facility]): Diff minor irregs; c. 07/2016 MV [facility]): "Equivocal"; d. 08/2016 Cardiac CT Ca2+ score [facility]): Ca2+ 1308; e. 05/2019 MV: No ischemia. EF 75%; f. 12/2019 PCI: LM nl, LAD 65ost (3.5x12 Resolute DES), 63m (2.75x12 Resolute DES),LCX/RCA nl; g. 08/2020 Cath: patent LAD stents, RCA 40ost, elev RH pressures, EF >65%.   Cervical spinal stenosis 1994   due to trauma to back ([employer] accident), has intermittent paralysis and parasthesias   Cervicogenic headache 03/23/2014   Depression    Dizziness    chronic dizziness   DJD (degenerative joint disease)    a. Chronic R shoulder pain pending R shoulder replacement 07/2017.   Esophageal stenosis March 2011   with transietn outlet obstruction by  food, cleared by EGD    Family history of adverse reaction to anesthesia    daughter PONV   Gastric bypass status for obesity    Headache(784.0)    Hypertension    IBS (irritable bowel syndrome)    Left bundle  branch block (LBBB)    a. Intermittently present - likely rate related. - patient denies   Obesity    Obstructive sleep apnea    using CPAP   Polyneuropathy in diabetes(357.2) 03/25/2013   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/09/2021   Right V1 distribution   Restless leg syndrome    Rotator cuff arthropathy, right 08/13/2017   Syncope and collapse 03/12/2014   Type II diabetes mellitus ([place])     SURGICAL HISTORY: Past Surgical History:  Procedure Laterality Date   ABDOMINAL HYSTERECTOMY     APPENDECTOMY     BACK SURGERY     CARDIAC CATHETERIZATION     COLONOSCOPY     CORONARY ANGIOPLASTY     CORONARY STENT INTERVENTION N/A 01/04/2020   Procedure: CORONARY STENT INTERVENTION;  Surgeon: [name], MD;  Location: [place] [facility];  Service: Cardiovascular;  Laterality: N/A;  LAD    DIAPHRAGMATIC HERNIA REPAIR  2015   ESOPHAGEAL DILATION     multiple   ESOPHAGOGASTRODUODENOSCOPY     ESOPHAGOGASTRODUODENOSCOPY (EGD) WITH PROPOFOL N/A 05/11/2021   Procedure: ESOPHAGOGASTRODUODENOSCOPY (EGD) WITH PROPOFOL;  Surgeon: [name], MD;  Location: [facility];  Service: Endoscopy;  Laterality: N/A;   ESOPHAGOGASTRODUODENOSCOPY (EGD) WITH PROPOFOL N/A 06/13/2021   Procedure: ESOPHAGOGASTRODUODENOSCOPY (EGD) WITH PROPOFOL;  Surgeon: [name], MD;  Location: [facility];  Service: Endoscopy;  Laterality: N/A;   GALLBLADDER SURGERY  1976   GALLBLADDER SURGERY  resection   GASTRIC BYPASS     GASTRIC BYPASS  2000, 2005   Dr. [name] IMPLANT PLACEMENT  10/2011   [place]   JOINT REPLACEMENT  2007   bilateral knee. [name],  [name]   LEFT HEART CATH AND CORS/GRAFTS ANGIOGRAPHY N/A 12/31/2019   Procedure: LEFT HEART CATH AND CORS/GRAFTS ANGIOGRAPHY;  Surgeon: [name], MD;  Location: [place] [facility];  Service: Cardiovascular;  Laterality: N/A;   PANNICULECTOMY  06/16/2019   PANNICULECTOMY N/A 06/16/2019   Procedure: PANNICULECTOMY;  Surgeon: [name],  MD;  Location: [place];  Service: Plastics;  Laterality: N/A;  3 hours, please   REVERSE SHOULDER ARTHROPLASTY Right 08/13/2017   Procedure: REVERSE RIGHT SHOULDER ARTHROPLASTY;  Surgeon: [name], MD;  Location: [place];  Service: Orthopedics;  Laterality: Right;   RIGHT/LEFT HEART CATH AND CORONARY ANGIOGRAPHY N/A 09/06/2020   Procedure: RIGHT/LEFT HEART CATH AND CORONARY ANGIOGRAPHY;  Surgeon: [name], MD;  Location: [place] [facility];  Service: Cardiovascular;  Laterality: N/A;   ROTATOR CUFF REPAIR     right   SPINE SURGERY  1995   [place]   TOTAL ABDOMINAL HYSTERECTOMY W/ BILATERAL SALPINGOOPHORECTOMY  4268   UMBILICAL HERNIA REPAIR  08/11/2015    SOCIAL HISTORY: Social History   Socioeconomic History   Marital status: Married    Spouse name: [name]    Number of children: 2   Years of education: College    Highest education level: Not on file  Occupational History    Employer: retired  Tobacco Use   Smoking status: Never    Passive exposure: Never   Smokeless tobacco: Never  Vaping Use   Vaping Use: Never used  Substance and Sexual Activity   Alcohol use: No   Drug use:  Not Currently    Comment: prescribed valium   Sexual activity: Not Currently    Birth control/protection: Post-menopausal, Surgical  Other Topics Concern   Not on file  Social History Narrative   Patient lives at home with husband [name].    Right Handed   Drinks caffeinated tea occasionally   Social Determinants of Health   Financial Resource Strain: Low Risk    Difficulty of Paying Living Expenses: Not hard at all  Food Insecurity: No Food Insecurity   Worried About Charity fundraiser in the Last Year: Never true   Arboriculturist in the Last Year: Never true  Transportation Needs: No Transportation Needs   Lack of Transportation (Medical): No   Lack of Transportation (Non-Medical): No  Physical Activity: Insufficiently Active   Days of Exercise per Week: 2 days   Minutes of  Exercise per Session: 40 min  Stress: Not on file  Social Connections: Not on file  Intimate Partner Violence: Not At Risk   Fear of Current or Ex-Partner: No   Emotionally Abused: No   Physically Abused: No   Sexually Abused: No    FAMILY HISTORY: Family History  Problem Relation Age of Onset   Heart disease Father    Hypertension Father    Prostate cancer Father    Stroke Father    Osteoporosis Father    Stroke Mother    Depression Mother    Headache Mother    Heart disease Mother    Thyroid disease Mother    Hypertension Mother    Diabetes Daughter    Heart disease Daughter    Hypertension Daughter    Hypertension Son     ALLERGIES:  is allergic to demeclocycline, erythromycin, flagyl [metronidazole], glucophage [metformin hcl], tetracyclines & related, diovan [valsartan], sulfa antibiotics, and xanax [alprazolam].  MEDICATIONS:  Current Outpatient Medications  Medication Sig Dispense Refill   aspirin EC 81 MG tablet Take 1 tablet (81 mg total) by mouth daily. 30 tablet 2   atorvastatin (LIPITOR) 80 MG tablet Take 1 tablet (80 mg total) by mouth daily. 90 tablet 3   clotrimazole-betamethasone (LOTRISONE) cream Apply 1 application topically 2 (two) times daily as needed.     Continuous Blood Gluc Receiver (FREESTYLE LIBRE 2 READER) DEVI Use to check sugar at least TID 1 each 1   Continuous Blood Gluc Sensor (FREESTYLE LIBRE 2 SENSOR) MISC 1 application by Does not apply route every 14 (fourteen) days. 2 each 0   cyanocobalamin (,VITAMIN B-12,) 1000 MCG/ML injection Inject 1 mL (1,000 mcg total) into the muscle every 30 (thirty) days. Inject 1 ml (1000 mcg ) IM weekly x 4,  Then monthly thereafter 3 mL 1   dapagliflozin propanediol (FARXIGA) 10 MG TABS tablet Take 1 tablet (10 mg total) by mouth daily. 90 tablet 3   dicyclomine (BENTYL) 20 MG tablet Take 1 tablet (20 mg total) by mouth 3 (three) times daily before meals. 90 tablet 1   diphenoxylate-atropine (LOMOTIL)  2.5-0.025 MG tablet Take 1 tablet by mouth 4 (four) times daily as needed for diarrhea or loose stools. 60 tablet 5   fexofenadine (ALLEGRA) 180 MG tablet Take 180 mg by mouth as needed for allergies or rhinitis.     insulin aspart (NOVOLOG FLEXPEN) 100 UNIT/ML FlexPen Inject 10-20 units with supper: If pre-meal glucose 100-150 - inject 4 units, 151-200 - inject 6 units; 201-250 - inject 8 units; 251-300 - inject 10 units, 301-350 - inject 12 units ; 350 >  inject 14 units 15 mL 1   insulin degludec (TRESIBA FLEXTOUCH) 100 UNIT/ML FlexTouch Pen Inject 8 Units into the skin daily. 15 mL 3   Insulin Pen Needle (DROPLET PEN NEEDLES) 31G X 5 MM MISC USE ONE NEEDLE SUBCUTANEOUSLY AS DIRECTED (REMOVE AND DISCARD NEEDLE IN SHARPS CONTAINER IMMEDIATELY AFTER USE) 100 each 1   Multiple Vitamins-Minerals (CENTRUM SILVER 50+WOMEN PO) Take by mouth.     neomycin-polymyxin b-dexamethasone (MAXITROL) 3.5-10000-0.1 SUSP Place 1 drop into the left eye 2 (two) times daily.     nitroGLYCERIN (NITROSTAT) 0.4 MG SL tablet Place 1 tablet (0.4 mg total) under the tongue every 5 (five) minutes as needed for chest pain. 25 tablet 1   NONFORMULARY OR COMPOUNDED ITEM [facility]  Combination Pain Cream -  Baclofen 2%, Doxepin 5%, Gabapentin 6%, Topiramate 2%, Pentoxifylline 3% Apply 1-2 grams to affected area 3-4 times daily Qty. 120 gm 3 refills 1 each 3   nystatin (MYCOSTATIN/NYSTOP) powder Apply 1 g topically 4 (four) times daily as needed (rash).     ondansetron (ZOFRAN-ODT) 4 MG disintegrating tablet Take 1 tablet (4 mg total) by mouth every 8 (eight) hours as needed for nausea or vomiting. 30 tablet 0   pantoprazole (PROTONIX) 40 MG tablet TAKE 1 TABLET BY MOUTH EVERY DAY 30 tablet 2   potassium chloride SA (KLOR-CON M) 20 MEQ tablet Take 20 mEq by mouth once.     pregabalin (LYRICA) 50 MG capsule Take 1 capsule (50 mg total) by mouth 2 (two) times daily. 60 capsule 3   sertraline (ZOLOFT) 25 MG tablet Take 1  tablet (25 mg total) by mouth daily. 90 tablet 1   spironolactone (ALDACTONE) 25 MG tablet Take 0.5 tablets (12.5 mg total) by mouth daily. 45 tablet 3   torsemide (DEMADEX) 20 MG tablet Take 1 tablet (20 mg total) by mouth daily. 30 tablet 2   hydrocortisone (ANUSOL-HC) 25 MG suppository Place 1 suppository (25 mg total) rectally at bedtime. (Patient not taking: Reported on 09/05/2021) 24 suppository 1   Lidocaine 4 % PTCH Apply topically as needed (pain). (Patient not taking: Reported on 09/05/2021)     No current facility-administered medications for this visit.      PHYSICAL EXAMINATION: Vitals:   09/05/21 1327  BP: (!) 142/58  Pulse: 64  Resp: 19  Temp: 98.7 F (37.1 C)  SpO2: 98%   Filed Weights   09/05/21 1327  Weight: 230 lb 1.6 oz (104.4 kg)     Physical Exam Constitutional:      Appearance: She is obese. She is not ill-appearing.  Pulmonary:     Effort: Pulmonary effort is normal.  Musculoskeletal:     Comments: 4 wheel rolling walker  Skin:    General: Skin is dry.     Coloration: Skin is not pale.  Neurological:     Mental Status: She is alert and oriented to person, place, and time.  Psychiatric:        Behavior: Behavior normal.    LABORATORY DATA:  I have reviewed the data as listed Lab Results  Component Value Date   WBC 11.0 (H) 09/05/2021   HGB 13.5 09/05/2021   HCT 42.3 09/05/2021   MCV 93.4 09/05/2021   PLT 275 09/05/2021   Recent Labs    01/27/21 1137 02/03/21 1229 02/08/21 1054 02/09/21 0435 04/26/21 1237 05/03/21 1805 05/12/21 1126 06/27/21 1124 08/10/21 1049 09/05/21 1306  NA  --    < > 139   < > 135  137 145*  --  142 135  K  --    < > 4.4   < > 2.9* 3.2* 4.0  --  4.0 3.8  CL  --    < > 95*   < > 89* 101 102  --  105 105  CO2  --    < > 29   < > 27 25 23   --  26 21*  GLUCOSE  --    < > 191*   < > 374* 184* 103*  --  133* 220*  BUN  --    < > 27*   < > 44* 20 13  --  6 15  CREATININE  --    < > 1.45*   < > 1.75* 1.33* 1.26*  1.30* 1.02 1.21*  CALCIUM  --    < > 8.8*   < > 8.1* 8.4* 9.7  --  9.0 8.7*  GFRNONAA  --    < > 38*   < > 30* 42*  --   --   --  47*  PROT 7.5  --  7.3  --   --   --   --   --  6.8  --   ALBUMIN 3.6  --  3.3*  --   --   --   --   --  3.7  --   AST 27  --  42*  --   --   --   --   --  18  --   ALT 19  --  20  --   --   --   --   --  15  --   ALKPHOS 111  --  102  --   --   --   --   --  147*  --   BILITOT 0.7  --  1.9*  --   --   --   --   --  0.6  --   BILIDIR <0.1  --   --   --   --   --   --   --   --   --   IBILI NOT CALCULATED  --   --   --   --   --   --   --   --   --    < > = values in this interval not displayed.   Iron/TIBC/Ferritin/ %Sat    Component Value Date/Time   IRON 48 12/28/2020 1324   IRON 33 (L) 10/30/2012 1436   TIBC 253 12/28/2020 1324   TIBC 312 10/30/2012 1436   FERRITIN 256 12/28/2020 1324   FERRITIN 36 10/30/2012 1436   IRONPCTSAT 19 12/28/2020 1324   IRONPCTSAT 8 (L) 03/06/2019 1624   No results found.  No problem-specific Assessment & Plan notes found for this encounter.  # Iron deficiency/secondary to gastric bypass/malabsorption- Ferritin last checked in June 2022 was 256. Iron saturation 19% with normal TIBC at that time. Last feraheme 12/28/20. Hemoglobin remains stable without evidence of microcytosis. Ferritin and iron studies pending at time of visit. Hold IV iron.   # B12 deficiency - secondary gastric bypass at home iron injections. B12 was 685 in July 2022. Continue home injections.   # Leukocytosis- wbc 11. Differential is normal. Monitor.   # Poorly controlled diabetes-HgbA1c 7.8- managed by pcp.  Blood sugar 220 today.   # Renal/electrolytes- potassium normal today. Continue follow up with cardiology & nephrology  # Fatigue:  does not complain of this today   # Esophagus: dysphagia- s/p dilation with Dr. Redell Nazir Norris   DISPOSITION: No feraheme today Rtc in 4 months for labs (cbc, ferritin, iron studies, bmp, b12), Dr. Rogue Bussing, possible  feraheme- la  All questions were answered. The patient knows to call the clinic with any problems, questions or concerns.   Verlon Au, NP 09/05/2021   CC: [name]

## 2021-09-08 ENCOUNTER — Other Ambulatory Visit: Payer: Self-pay

## 2021-09-08 ENCOUNTER — Other Ambulatory Visit
Admission: RE | Admit: 2021-09-08 | Discharge: 2021-09-08 | Disposition: A | Discharge status: Home / Self Care | Payer: Medicare Other | Source: Ambulatory Visit | Attending: Urology | Admitting: Urology

## 2021-09-08 HISTORY — DX: Gastro-esophageal reflux disease without esophagitis: K21.9

## 2021-09-08 HISTORY — DX: Unspecified asthma, uncomplicated: J45.909

## 2021-09-08 HISTORY — DX: Angina pectoris, unspecified: I20.9

## 2021-09-08 HISTORY — DX: Dyspnea, unspecified: R06.00

## 2021-09-08 NOTE — Patient Instructions (Addendum)
Your procedure is scheduled on: 09/18/21 - Monday Report to the Registration Desk on the 1st floor of the [facility]. To find out your arrival time, please call [phone] between 1PM - 3PM on: 09/15/21 - Friday  REMEMBER: Instructions that are not followed completely may result in serious medical risk, up to and including death; or upon the discretion of your surgeon and anesthesiologist your surgery may need to be rescheduled.  Do not eat food or drink any fluids after midnight the night before surgery.  No gum chewing, lozengers or hard candies.  TAKE THESE MEDICATIONS THE MORNING OF SURGERY WITH A SIP OF WATER:  - atorvastatin (LIPITOR) 80 MG tablet - dicyclomine (BENTYL) 20 MG tablet - pantoprazole (PROTONIX) 40 MG tablet, (take one the night before and one on the morning of surgery - helps to prevent nausea after surgery.) - pregabalin (LYRICA) 50 MG capsule - sertraline (ZOLOFT) 25 MG tablet  - dapagliflozin propanediol (FARXIGA) 10 MG TABS tablet - Stop taking beginning 09/15/21, may resume the day after surgery.  - insulin degludec (TRESIBA FLEXTOUCH) 100 UNIT/ML FlexTouch Pen - inject only 1/2 the night before your procedure.  Follow recommendations from Cardiologist, Pulmonologist or PCP regarding stopping Aspirin, Coumadin, Plavix, Eliquis, Pradaxa, or Pletal.  One week prior to surgery: Stop Anti-inflammatories (NSAIDS) such as Advil, Aleve, Ibuprofen, Motrin, Naproxen, Naprosyn and Aspirin based products such as Excedrin, Goodys Powder, BC Powder.  Stop ANY OVER THE COUNTER supplements until after surgery.  You may take Tylenol if needed for pain up until the day of surgery.  No Alcohol for 24 hours before or after surgery.  No Smoking including e-cigarettes for 24 hours prior to surgery.  No chewable tobacco products for at least 6 hours prior to surgery.  No nicotine patches on the day of surgery.  Do not use any "recreational" drugs for at least a week  prior to your surgery.  Please be advised that the combination of cocaine and anesthesia may have negative outcomes, up to and including death. If you test positive for cocaine, your surgery will be cancelled.  On the morning of surgery brush your teeth with toothpaste and water, you may rinse your mouth with mouthwash if you wish. Do not swallow any toothpaste or mouthwash.  Do not wear jewelry, make-up, hairpins, clips or nail polish.  Do not wear lotions, powders, or perfumes.   Do not shave body from the neck down 48 hours prior to surgery just in case you cut yourself which could leave a site for infection.  Also, freshly shaved skin may become irritated if using the CHG soap.  Contact lenses, hearing aids and dentures may not be worn into surgery.  Do not bring valuables to the hospital. [facility] is not responsible for any missing/lost belongings or valuables.   Bring your C-PAP to the hospital with you in case you may have to spend the night.   Notify your doctor if there is any change in your medical condition (cold, fever, infection).  Wear comfortable clothing (specific to your surgery type) to the hospital.  After surgery, you can help prevent lung complications by doing breathing exercises.  Take deep breaths and cough every 1-2 hours. Your doctor may order a device called an Incentive Spirometer to help you take deep breaths. When coughing or sneezing, hold a pillow firmly against your incision with both hands. This is called splinting. Doing this helps protect your incision. It also decreases belly discomfort.  If you  are being admitted to the hospital overnight, leave your suitcase in the car. After surgery it may be brought to your room.  If you are being discharged the day of surgery, you will not be allowed to drive home. You will need a responsible adult (18 years or older) to drive you home and stay with you that night.   If you are taking public  transportation, you will need to have a responsible adult (18 years or older) with you. Please confirm with your physician that it is acceptable to use public transportation.   Please call the [facility] at [phone] if you have any questions about these instructions.  Surgery Visitation Policy:  Patients undergoing a surgery or procedure may have one family member or support person with them as long as that person is not COVID-19 positive or experiencing its symptoms.  That person may remain in the waiting area during the procedure and may rotate out with other people.  Inpatient Visitation:    Visiting hours are 7 a.m. to 8 p.m. Up to two visitors ages 16+ are allowed at one time in a patient room. The visitors may rotate out with other people during the day. Visitors must check out when they leave, or other visitors will not be allowed. One designated support person may remain overnight. The visitor must pass COVID-19 screenings, use hand sanitizer when entering and exiting the patients room and wear a mask at all times, including in the patients room. Patients must also wear a mask when staff or their visitor are in the room. Masking is required regardless of vaccination status.

## 2021-09-11 NOTE — Progress Notes (Signed)
PATIENT: Denise Sharp DOB: [birth year]  REASON FOR VISIT: Follow up for peripheral neuropathy, gait disorder, postherpetic neuralgia HISTORY FROM: Patient PRIMARY NEUROLOGIST: Dr. Krista Blue   HISTORY OF PRESENT ILLNESS: Today 09/12/21 Denise Sharp is here today for follow-up. Doing okay, 1 fall since August, her blood sugar dropped. Uses walker. Recent A1C 7.8. She doesn't drive often. She lives with her husband. No longer having post-herpetic neuralgia. Is taking lyrica 50 mg twice daily.  Has cramping of feet up the legs. Right now is controlled. Hits her at different times. On Monday, having interstim implant for bladder replaced with MRI compatible. Has chronic neck pain and headaches. Injury of C4-6 in past. Claims can go asleep and then wake up not able to move, will have to take anti-inflammatory medications, wait for her spine "to get back in place". Her legs are getting weaker overtime. Has frequent headache, neck pain. Limited ROM of neck. Wants to move her care to [facility] area where her children are located. Wants MRI brain, cervical spine once stimulator is replaced.   HISTORY  03/09/21 Dr. Jannifer Franklin: Denise Sharp is a 76 year old right-handed white female with a history of diabetes, obesity, and a significant diabetic peripheral neuropathy associated with a gait disorder.  She has had issues with episodic falls.  The patient is now using a walker religiously when she ambulates.  The patient has some assistance in the home environment.  She still is operating motor vehicle and she believes that she can do this safely, but her husband usually is the one who drives.  She has noted onset of a right V1 distribution shingles outbreak, she is having a lot of discomfort with this currently.  She is on Lyrica but is only taking 50 mg daily.  She is having difficulty sleeping because of the pain.  She has not fallen recently with a walker.  She comes here for further evaluation.  REVIEW OF SYSTEMS: Out  of a complete 14 system review of symptoms, the patient complains only of the following symptoms, and all other reviewed systems are negative.  See HPI  ALLERGIES: Allergies  Allergen Reactions   Demeclocycline Hives   Erythromycin Nausea And Vomiting and Other (See Comments)    Severe irritable bowel   Flagyl [Metronidazole] Nausea And Vomiting and Other (See Comments)    Severe irritable bowel   Glucophage [Metformin Hcl] Nausea And Vomiting and Other (See Comments)    "Sick" "I won't take anything that has metformin in it"   Tetracyclines & Related Hives and Rash   Diovan [Valsartan] Nausea Only        Sulfa Antibiotics Rash and Other (See Comments)    As child   Xanax [Alprazolam] Other (See Comments)    Unknown reaction    HOME MEDICATIONS: Outpatient Medications Prior to Visit  Medication Sig Dispense Refill   aspirin EC 81 MG tablet Take 1 tablet (81 mg total) by mouth daily. 30 tablet 2   atorvastatin (LIPITOR) 80 MG tablet Take 1 tablet (80 mg total) by mouth daily. 90 tablet 3   clotrimazole-betamethasone (LOTRISONE) cream Apply 1 application topically 2 (two) times daily as needed (irritation).     Continuous Blood Gluc Receiver (FREESTYLE LIBRE 2 READER) DEVI Use to check sugar at least TID 1 each 1   Continuous Blood Gluc Sensor (FREESTYLE LIBRE 2 SENSOR) MISC 1 application by Does not apply route every 14 (fourteen) days. 2 each 0   cyanocobalamin (,VITAMIN B-12,) 1000 MCG/ML injection Inject 1  mL (1,000 mcg total) into the muscle every 30 (thirty) days. Inject 1 ml (1000 mcg ) IM weekly x 4,  Then monthly thereafter 3 mL 1   dapagliflozin propanediol (FARXIGA) 10 MG TABS tablet Take 1 tablet (10 mg total) by mouth daily. 90 tablet 3   dicyclomine (BENTYL) 20 MG tablet Take 1 tablet (20 mg total) by mouth 3 (three) times daily before meals. 90 tablet 1   diphenoxylate-atropine (LOMOTIL) 2.5-0.025 MG tablet Take 1 tablet by mouth 4 (four) times daily as needed for  diarrhea or loose stools. 60 tablet 5   fexofenadine (ALLEGRA) 180 MG tablet Take 180 mg by mouth as needed for allergies or rhinitis.     insulin aspart (NOVOLOG FLEXPEN) 100 UNIT/ML FlexPen Inject 10-20 units with supper: If pre-meal glucose 100-150 - inject 4 units, 151-200 - inject 6 units; 201-250 - inject 8 units; 251-300 - inject 10 units, 301-350 - inject 12 units ; 350 > inject 14 units 15 mL 1   insulin degludec (TRESIBA FLEXTOUCH) 100 UNIT/ML FlexTouch Pen Inject 8 Units into the skin daily. 15 mL 3   Insulin Pen Needle (DROPLET PEN NEEDLES) 31G X 5 MM MISC USE ONE NEEDLE SUBCUTANEOUSLY AS DIRECTED (REMOVE AND DISCARD NEEDLE IN SHARPS CONTAINER IMMEDIATELY AFTER USE) 100 each 1   Multiple Vitamins-Minerals (CENTRUM SILVER 50+WOMEN PO) Take 1 tablet by mouth daily.     nitroGLYCERIN (NITROSTAT) 0.4 MG SL tablet Place 1 tablet (0.4 mg total) under the tongue every 5 (five) minutes as needed for chest pain. 25 tablet 1   nystatin (MYCOSTATIN/NYSTOP) powder Apply 1 g topically 4 (four) times daily as needed (rash).     ondansetron (ZOFRAN-ODT) 4 MG disintegrating tablet Take 1 tablet (4 mg total) by mouth every 8 (eight) hours as needed for nausea or vomiting. 30 tablet 0   OVER THE COUNTER MEDICATION Apply 1 application topically daily as needed (pain). [place] Gel topical pain reliever     pantoprazole (PROTONIX) 40 MG tablet TAKE 1 TABLET BY MOUTH EVERY DAY 30 tablet 2   potassium chloride SA (KLOR-CON M) 20 MEQ tablet Take 20 mEq by mouth once.     pregabalin (LYRICA) 50 MG capsule Take 1 capsule (50 mg total) by mouth 2 (two) times daily. 60 capsule 3   sertraline (ZOLOFT) 25 MG tablet Take 1 tablet (25 mg total) by mouth daily. 90 tablet 1   spironolactone (ALDACTONE) 25 MG tablet Take 0.5 tablets (12.5 mg total) by mouth daily. 45 tablet 3   torsemide (DEMADEX) 20 MG tablet Take 1 tablet (20 mg total) by mouth daily. 30 tablet 2   No facility-administered medications prior to visit.     PAST MEDICAL HISTORY: Past Medical History:  Diagnosis Date   (HFpEF) heart failure with preserved ejection fraction ([place])    a. 12/2019 Echo: EF 65-70%, Gr2 DD. No significant valvular dzs.   Abnormality of gait 03/25/2013   Adrenal mass, left (HCC)    Anemia    iron deficient post  2 unit txfsn 2009, normal endo/colonoscopy by [facility]   Anginal pain ([place])    Arthritis    Asthma    CAD (coronary artery disease) with h/o Atypical Chest Pain    a. 04/1986 Cath ([facility]): nl cors, EF 65%; b. 07/2012 Cath [facility]): Diff minor irregs; c. 07/2016 MV [facility]): "Equivocal"; d. 08/2016 Cardiac CT Ca2+ score [facility]): Ca2+ 3716; e. 05/2019 MV: No ischemia. EF 75%; f. 12/2019 PCI: LM nl, LAD 65ost (3.5x12 Resolute DES),  73m (2.75x12 Resolute DES),LCX/RCA nl; g. 08/2020 Cath: patent LAD stents, RCA 40ost, elev RH pressures, EF >65%.   Cervical spinal stenosis 1994   due to trauma to back ([employer] accident), has intermittent paralysis and parasthesias   Cervicogenic headache 03/23/2014   Depression    Dizziness    chronic dizziness   DJD (degenerative joint disease)    a. Chronic R shoulder pain pending R shoulder replacement 07/2017.   Dyspnea    Esophageal stenosis 09/2009   with transietn outlet obstruction by food, cleared by EGD    Family history of adverse reaction to anesthesia    daughter PONV   Gastric bypass status for obesity    GERD (gastroesophageal reflux disease)    Headache(784.0)    Hypertension    IBS (irritable bowel syndrome)    Left bundle branch block (LBBB)    a. Intermittently present - likely rate related. - patient denies   Obesity    Obstructive sleep apnea    using CPAP   Polyneuropathy in diabetes(357.2) 03/25/2013   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/09/2021   Right V1 distribution   Restless leg syndrome    Rotator cuff arthropathy, right 08/13/2017   Syncope and collapse 03/12/2014   Type II diabetes mellitus ([place])     PAST SURGICAL HISTORY: Past Surgical History:   Procedure Laterality Date   ABDOMINAL HYSTERECTOMY     APPENDECTOMY     BACK SURGERY     CARDIAC CATHETERIZATION     CHOLECYSTECTOMY     COLONOSCOPY     CORONARY ANGIOPLASTY     CORONARY STENT INTERVENTION N/A 01/04/2020   Procedure: CORONARY STENT INTERVENTION;  Surgeon: [name], MD;  Location: [place] [facility];  Service: Cardiovascular;  Laterality: N/A;  LAD    DIAPHRAGMATIC HERNIA REPAIR  2015   ESOPHAGEAL DILATION     multiple   ESOPHAGOGASTRODUODENOSCOPY     ESOPHAGOGASTRODUODENOSCOPY (EGD) WITH PROPOFOL N/A 05/11/2021   Procedure: ESOPHAGOGASTRODUODENOSCOPY (EGD) WITH PROPOFOL;  Surgeon: [name], MD;  Location: [facility];  Service: Endoscopy;  Laterality: N/A;   ESOPHAGOGASTRODUODENOSCOPY (EGD) WITH PROPOFOL N/A 06/13/2021   Procedure: ESOPHAGOGASTRODUODENOSCOPY (EGD) WITH PROPOFOL;  Surgeon: [name], MD;  Location: [facility];  Service: Endoscopy;  Laterality: N/A;   EYE SURGERY     GALLBLADDER SURGERY  1976   GALLBLADDER SURGERY  resection   GASTRIC BYPASS     GASTRIC BYPASS  2000, 2005   Dr. [name] IMPLANT PLACEMENT  10/2011   [place]   JOINT REPLACEMENT  2007   bilateral knee. [name],  [name]   LEFT HEART CATH AND CORS/GRAFTS ANGIOGRAPHY N/A 12/31/2019   Procedure: LEFT HEART CATH AND CORS/GRAFTS ANGIOGRAPHY;  Surgeon: [name], MD;  Location: [place] [facility];  Service: Cardiovascular;  Laterality: N/A;   PANNICULECTOMY  06/16/2019   PANNICULECTOMY N/A 06/16/2019   Procedure: PANNICULECTOMY;  Surgeon: [name], MD;  Location: [place];  Service: Plastics;  Laterality: N/A;  3 hours, please   REVERSE SHOULDER ARTHROPLASTY Right 08/13/2017   Procedure: REVERSE RIGHT SHOULDER ARTHROPLASTY;  Surgeon: [name], MD;  Location: [place];  Service: Orthopedics;  Laterality: Right;   RIGHT/LEFT HEART CATH AND CORONARY ANGIOGRAPHY N/A 09/06/2020   Procedure: RIGHT/LEFT HEART CATH AND CORONARY ANGIOGRAPHY;   Surgeon: [name], MD;  Location: [place] [facility];  Service: Cardiovascular;  Laterality: N/A;   ROTATOR CUFF REPAIR     right   [place]  TOTAL ABDOMINAL HYSTERECTOMY W/ BILATERAL SALPINGOOPHORECTOMY  1660   UMBILICAL HERNIA REPAIR  08/11/2015    FAMILY HISTORY: Family History  Problem Relation Age of Onset   Heart disease Father    Hypertension Father    Prostate cancer Father    Stroke Father    Osteoporosis Father    Stroke Mother    Depression Mother    Headache Mother    Heart disease Mother    Thyroid disease Mother    Hypertension Mother    Diabetes Daughter    Heart disease Daughter    Hypertension Daughter    Hypertension Son     SOCIAL HISTORY: Social History   Socioeconomic History   Marital status: Married    Spouse name: [name]    Number of children: 2   Years of education: College    Highest education level: Not on file  Occupational History    Employer: retired  Tobacco Use   Smoking status: Never    Passive exposure: Never   Smokeless tobacco: Never  Vaping Use   Vaping Use: Never used  Substance and Sexual Activity   Alcohol use: No   Drug use: Not Currently    Comment: prescribed valium   Sexual activity: Not Currently    Birth control/protection: Post-menopausal, Surgical  Other Topics Concern   Not on file  Social History Narrative   Patient lives at home with husband [name].    Right Handed   Drinks caffeinated tea occasionally   Social Determinants of Health   Financial Resource Strain: Low Risk    Difficulty of Paying Living Expenses: Not hard at all  Food Insecurity: No Food Insecurity   Worried About Charity fundraiser in the Last Year: Never true   Arboriculturist in the Last Year: Never true  Transportation Needs: No Transportation Needs   Lack of Transportation (Medical): No   Lack of Transportation (Non-Medical): No  Physical Activity: Insufficiently Active   Days of Exercise per Week: 2  days   Minutes of Exercise per Session: 40 min  Stress: Not on file  Social Connections: Not on file  Intimate Partner Violence: Not At Risk   Fear of Current or Ex-Partner: No   Emotionally Abused: No   Physically Abused: No   Sexually Abused: No   PHYSICAL EXAM  Vitals:   09/12/21 1012  BP: (!) 132/58  Pulse: 64  Weight: 234 lb (106.1 kg)  Height: 5' 4.5" (1.638 m)   Body mass index is 39.55 kg/m.  Generalized: Well developed, in no acute distress   Neurological examination  Mentation: Alert oriented to time, place, history taking. Follows all commands speech and language fluent Cranial nerve II-XII: Pupils were equal round reactive to light. Extraocular movements were full, visual field were full on confrontational test. Facial sensation and strength were normal. Head turning and shoulder shrug  were normal and symmetric. Limited ROM of cervical spine to about 60 degree limitation is more towards the left  Motor: Overall good muscle strength throughout Sensory: Length dependent sensory deficit to soft touch to knees Coordination: Cerebellar testing reveals good finger-nose-finger and heel-to-shin bilaterally.  Gait and station: Gait is shuffling type, short steps, unsteady, is fairly stable with walker Reflexes: Deep tendon reflexes are symmetric and normal bilaterally.   DIAGNOSTIC DATA (LABS, IMAGING, TESTING) - I reviewed patient records, labs, notes, testing and imaging myself where available.  Lab Results  Component Value Date   WBC 11.0 (H) 09/05/2021  HGB 13.5 09/05/2021   HCT 42.3 09/05/2021   MCV 93.4 09/05/2021   PLT 275 09/05/2021      Component Value Date/Time   NA 135 09/05/2021 1306   NA 145 (H) 05/12/2021 1126   NA 141 06/21/2014 1349   K 3.8 09/05/2021 1306   K 4.0 06/21/2014 1349   CL 105 09/05/2021 1306   CL 106 06/21/2014 1349   CO2 21 (L) 09/05/2021 1306   CO2 24 06/21/2014 1349   GLUCOSE 220 (H) 09/05/2021 1306   GLUCOSE 174 (H)  06/21/2014 1349   BUN 15 09/05/2021 1306   BUN 13 05/12/2021 1126   BUN 12 06/21/2014 1349   CREATININE 1.21 (H) 09/05/2021 1306   CREATININE 0.88 03/06/2019 1624   CALCIUM 8.7 (L) 09/05/2021 1306   CALCIUM 8.7 06/21/2014 1349   PROT 6.8 08/10/2021 1049   PROT 7.3 10/30/2012 1436   ALBUMIN 3.7 08/10/2021 1049   ALBUMIN 3.0 (L) 10/30/2012 1436   AST 18 08/10/2021 1049   AST 16 10/30/2012 1436   ALT 15 08/10/2021 1049   ALT 19 10/30/2012 1436   ALKPHOS 147 (H) 08/10/2021 1049   ALKPHOS 125 10/30/2012 1436   BILITOT 0.6 08/10/2021 1049   BILITOT 0.2 10/30/2012 1436   GFRNONAA 47 (L) 09/05/2021 1306   GFRNONAA 57 (L) 06/21/2014 1349   GFRNONAA 55 (L) 02/25/2014 2153   GFRNONAA 70 12/13/2011 0919   GFRAA >60 03/23/2020 1204   GFRAA >60 06/21/2014 1349   GFRAA >60 02/25/2014 2153   GFRAA 80 12/13/2011 0919   Lab Results  Component Value Date   CHOL 186 08/10/2021   HDL 43.10 08/10/2021   LDLCALC 108 (H) 08/10/2021   LDLDIRECT 111.0 06/16/2015   TRIG 175.0 (H) 08/10/2021   CHOLHDL 4 08/10/2021   Lab Results  Component Value Date   HGBA1C 7.8 (H) 08/10/2021   Lab Results  Component Value Date   VITAMINB12 685 02/08/2021   Lab Results  Component Value Date   TSH 2.209 02/08/2021      ASSESSMENT AND PLAN 76 y.o. year old female   1.  Diabetic peripheral neuropathy 2.  Gait disorder 3.  Right V1 distribution shingles outbreak, postherpetic neuralgia-resolved 4. Chronic neck pain, cervicogenic headache   -On Lyrica 50 mg twice daily, neuropathy pain is under good control, PCP has filled the Lyrica  -She has chronic neck pain, cervicogenic headache, has not been able to have MRI of the brain or cervical spine due to incompatible bladder implant, however this will be replaced in the near future, she is requesting to have imaging done, once she is clear, she will contact me and we can pursue this (MRI of the brain and cervical spine)  -In July 2022 CT head showed  moderate chronic microvascular ischemic disease, CT cervical spine showed multilevel degenerative changes greatest at C5-C6  -Depending on imaging we can then decide next follow-up steps, she would like her care to be close to [facility] since her children are now involved in her and her husbands care  Butler Denmark, AGNP-C, DNP 09/12/2021, 10:33 AM [facility] [address] [place], [place] [address] [phone]

## 2021-09-12 ENCOUNTER — Encounter: Payer: Self-pay | Admitting: Neurology

## 2021-09-12 ENCOUNTER — Ambulatory Visit (INDEPENDENT_AMBULATORY_CARE_PROVIDER_SITE_OTHER): Payer: Medicare Other | Admitting: Neurology

## 2021-09-12 VITALS — BP 132/58 | HR 64 | Ht 64.5 in | Wt 234.0 lb

## 2021-09-12 DIAGNOSIS — G4486 Cervicogenic headache: Secondary | ICD-10-CM

## 2021-09-12 DIAGNOSIS — G8929 Other chronic pain: Secondary | ICD-10-CM | POA: Diagnosis not present

## 2021-09-12 DIAGNOSIS — M542 Cervicalgia: Secondary | ICD-10-CM | POA: Diagnosis not present

## 2021-09-12 DIAGNOSIS — E1142 Type 2 diabetes mellitus with diabetic polyneuropathy: Secondary | ICD-10-CM | POA: Diagnosis not present

## 2021-09-12 NOTE — Patient Instructions (Signed)
Let me know when you are cleared for MRI, I can order imaging at that time

## 2021-09-13 ENCOUNTER — Encounter: Payer: Self-pay | Admitting: Urology

## 2021-09-13 ENCOUNTER — Ambulatory Visit: Payer: Medicare Other

## 2021-09-13 ENCOUNTER — Telehealth: Payer: Self-pay | Admitting: Internal Medicine

## 2021-09-13 NOTE — Telephone Encounter (Signed)
Pt want to know if she can change her appt to telephone instead of in person because she is not feeling well. Pt need to know by 230 ?

## 2021-09-13 NOTE — Progress Notes (Signed)
Perioperative Services  Pre-Admission/Anesthesia Testing Clinical Review  Date: 09/13/21  Patient Demographics:  Name: Denise Sharp DOB:   October 28, 1945 MRN:   [id number]  Planned Surgical Procedure(s):    Case: [id number] Date/Time: 09/18/21 1300   Procedures:      REMOVAL OF INTERSTIM IMPLANT     INTERSTIM IMPLANT FIRST STAGE     INTERSTIM IMPLANT SECOND STAGE WITH IMPEDENCE CHECK   Anesthesia type: Choice   Pre-op diagnosis: Malfunctioning Interstim   Location: [facility] OR ROOM 02 / [place] [facility]S FOR ANESTHESIA GROUP   Surgeons: Bjorn Loser, MD   NOTE: Available PAT nursing documentation and vital signs have been reviewed. Clinical nursing staff has updated patient's PMH/PSHx, current medication list, and drug allergies/intolerances to ensure comprehensive history available to assist in medical decision making as it pertains to the aforementioned surgical procedure and anticipated anesthetic course. Extensive review of available clinical information performed. Denise Sharp PMH and PSHx updated with any diagnoses/procedures that  may have been inadvertently omitted during her intake with the pre-admission testing department's nursing staff.  Clinical Discussion:  Denise Sharp is a 76 y.o. female who is submitted for pre-surgical anesthesia review and clearance prior to her undergoing the above procedure. Patient has never been a smoker. Pertinent PMH includes: CAD, angina, LBBB, HFpEF, NSVT, aortic atherosclerosis, HTN, HLD, T2DM, OSAH (requires nocturnal PAP therapy), dyspnea, CKD-III, esophageal stenosis, GERD (on daily PPI), OA, chronic dizziness, IDA, DJD, cervical stenosis, polyneuropathy, PHN, RLS, depression, anxiety.   Patient is followed by cardiology (End, MD). She was last seen in the cardiology clinic on 08/18/2021; notes reviewed.  At the time of her clinic visit, patient was reporting rare episodes of "mild chest heaviness" that had been occurring on an intermittent basis  over the course of the preceding 6 months.  Patient denied any shortness of breath, PND, orthopnea, palpitations, presyncope/syncope.  Patient with exertional dyspnea, peripheral edema, and fatigue.  Patient reported that she was under more stress due to her personal health and the health of her husband.  Patient had recently been started on SSRI for anxiety.  Past medical history significant for cardiovascular diagnoses.  Patient has undergone multiple left heart catheterizations as follows:  Diagnostic left heart catheterization performed in 04/1986 revealed a normal left ventricular systolic function with an EF of 65%.  Coronary anatomy was normal.  Diagnostic left heart catheterization was performed on 07/24/2012 revealing diffuse minor irregularities in the coronary anatomy; no intervention required.  Patient to proceed with medical management.  Diagnostic left heart catheterization performed in 12/2019 revealing multivessel CAD; 65% ostial LAD, 85% mid LCx.  Subsequent PCI was performed placing a 3.5 x 12 mm Resolute DES x1 to the ostial LAD and a 2.75 x 12 mm Resolute DES x1 to the mid LCx.  Diagnostic right/left cardiac catheterization was performed on 09/06/2020 revealing nonobstructive CAD; 40% stenosis of the ostial RCA.  Previously placed stents in the LAD widely patent.  Mean PA pressure 27 mmHg.  PCWP 22 mmHg.  PA saturation 68%.  Cardiac output 3.9 L/min.  Cardiac index 1.8 L/min/m.  TTE performed on 12/30/2019 revealing a normal left ventricular systolic function with an EF of 65 to 70%.  Diastolic Doppler parameters consistent with pseudonormalization (G2DD). There is no evidence of significant valvular regurgitation or transvalvular gradient suggestive of stenosis.     Long-term cardiac event monitor study performed on 11/15/2020 revealing a predominant underlying sinus rhythm at an average heart rate of 77 bpm; range 53-121 bpm).  There was  rare atrial and ventricular ectopy.  There  were 11 episodes of NSVT observed, lasting up to 8 beats with a maximum rate of 210 bpm.  There were 14 atrial runs lasting up to 18 beats with a maximal heart rate of 250 bpm.    Patient has undergone multiple myocardial perfusion imaging studies.  Last study was performed on 01/26/2021 revealing a hyperdynamic left ventricular systolic function with an EF of 96%.  CT attenuation correction images revealed no significant aortic atherosclerosis.  There was no evidence of stress-induced myocardial ischemia or arrhythmia.  Study determined to be low risk.  Blood pressure well controlled at 122/68 on currently prescribed diuretic therapies.  Patient is on a statin for her HLD diagnosis and further ASCVD prevention.  LDL suboptimally controlled.  Discussed diet lifestyle modifications with plans to recheck FLP in 3 months, at which time a PCSK9i or ezetimibe if not better controlled. Patient has as needed SL NTG to use for anginal symptoms, however patient denied recent use.  T2DM reasonably controlled on currently prescribed regimen; last Hgb A1c was down to 7.8% when checked on 08/10/2021 (previously 8.9%).  Of note, patient is on an SGLT2i (dapagliflozin) as part of her T2DM regimen in the setting of known HFpEF. Patient has an OSAH diagnosis and is compliant with prescribed nocturnal PAP therapy.  Functional capacity limited by age-related debility, overall cardiovascular health, and multiple medical comorbidities.  Patient questionably able to achieve 4 METS of activity without angina/anginal equivalent symptoms.  No changes were made to her medication regimen.  Patient to follow-up with outpatient cardiology in 3 months or sooner if needed.  Denise Sharp is scheduled for an REMOVAL OF INTERSTIM IMPLANT; INTERSTIM IMPLANT FIRST STAGE; INTERSTIM IMPLANT SECOND STAGE WITH IMPEDENCE CHECK on 09/18/2021 with Dr. Bjorn Loser, MD. Given patient's past medical history significant for cardiovascular  diagnoses, presurgical cardiac clearance was sought by the PAT team.  Per cardiology, "Denise Sharp reports ongoing problems with her bladder stimulator.  I think it is reasonable for her to undergo revision of her bladder stimulator at her convenience without further cardiac testing or intervention, given that she does not have any unstable cardiac symptoms and this is a low risk procedure from a cardiovascular standpoint.  I suggest that aspirin 81 mg daily be continued in the perioperative period, if possible, given history of prior stenting of the LAD".  Patient denies previous perioperative complications with anesthesia in the past.  However, with that being said, there is a familial history of (+) PONV in a first-degree relative (daughter). In review of the available records, it is noted that patient underwent a general anesthetic course here (ASA IV) in 05/2021 without documented complications.   Vitals with BMI 09/12/2021 09/05/2021 08/18/2021  Height 5' 4.5" - 5' 4.5"  Weight 234 lbs 230 lbs 2 oz 240 lbs 2 oz  BMI 39.56 77.4 12.8  Systolic 786 767 209  Diastolic 58 58 68  Pulse 64 64 73    Providers/Specialists:   NOTE: Primary physician provider listed below. Patient may have been seen by APP or partner within same practice.   PROVIDER ROLE / SPECIALTY LAST Fayrene Fearing, MD Urology (Surgeon) 08/07/2021  [name], MD Primary Care Provider 08/11/2021  End, Harrell Gave, MD Cardiology 08/18/2021  Margette Fast, MD Neurology 09/12/2021  Charlaine Dalton, MD Hematology 09/05/2021  Lyla Son, MD Nephrology 07/20/2021   Allergies:  Demeclocycline, Erythromycin, Flagyl [metronidazole], Glucophage [metformin hcl], Tetracyclines & related, Diovan [valsartan], Sulfa antibiotics,  and Xanax [alprazolam]  Current Home Medications:   No current facility-administered medications for this encounter.    aspirin EC 81 MG tablet   atorvastatin (LIPITOR) 80 MG tablet    clotrimazole-betamethasone (LOTRISONE) cream   cyanocobalamin (,VITAMIN B-12,) 1000 MCG/ML injection   dapagliflozin propanediol (FARXIGA) 10 MG TABS tablet   dicyclomine (BENTYL) 20 MG tablet   diphenoxylate-atropine (LOMOTIL) 2.5-0.025 MG tablet   fexofenadine (ALLEGRA) 180 MG tablet   insulin aspart (NOVOLOG FLEXPEN) 100 UNIT/ML FlexPen   insulin degludec (TRESIBA FLEXTOUCH) 100 UNIT/ML FlexTouch Pen   Multiple Vitamins-Minerals (CENTRUM SILVER 50+WOMEN PO)   nitroGLYCERIN (NITROSTAT) 0.4 MG SL tablet   nystatin (MYCOSTATIN/NYSTOP) powder   ondansetron (ZOFRAN-ODT) 4 MG disintegrating tablet   OVER THE COUNTER MEDICATION   pantoprazole (PROTONIX) 40 MG tablet   potassium chloride SA (KLOR-CON M) 20 MEQ tablet   pregabalin (LYRICA) 50 MG capsule   sertraline (ZOLOFT) 25 MG tablet   spironolactone (ALDACTONE) 25 MG tablet   torsemide (DEMADEX) 20 MG tablet   Continuous Blood Gluc Receiver (FREESTYLE LIBRE 2 READER) DEVI   Continuous Blood Gluc Sensor (FREESTYLE LIBRE 2 SENSOR) MISC   Insulin Pen Needle (DROPLET PEN NEEDLES) 31G X 5 MM MISC   History:   Past Medical History:  Diagnosis Date   (HFpEF) heart failure with preserved ejection fraction ([place])    a. 12/2019 Echo: EF 65-70%, Gr2 DD. No significant valvular dzs.   Abnormality of gait 03/25/2013   Adrenal mass, left (HCC)    Anginal pain (HCC)    Anxiety    Aortic atherosclerosis (HCC)    Arthritis    Asthma    CAD (coronary artery disease) with h/o Atypical Chest Pain    a. 04/1986 Cath ([facility]): nl cors, EF 65%; b. 07/2012 Cath [facility]): Diff minor irregs; c. 07/2016 MV [facility]): "Equivocal"; d. 08/2016 Cardiac CT Ca2+ score [facility]): Ca2+ 2683; e. 05/2019 MV: No ischemia. EF 75%; f. 12/2019 PCI: LM nl, LAD 65ost (3.5x12 Resolute DES), 45m (2.75x12 Resolute DES),LCX/RCA nl; g. 08/2020 Cath: patent LAD stents, RCA 40ost, elev RH pressures, EF >65%.   Cervical spinal stenosis 1994   due to trauma to back ([employer] accident), has  intermittent paralysis and parasthesias   Cervicogenic headache 03/23/2014   CKD (chronic kidney disease), stage III (HCC)    Depression    Diverticulosis    Dizziness    a.) chronic   DJD (degenerative joint disease)    a. Chronic R shoulder pain   Dyspnea    Esophageal stenosis 09/2009   a.) transient outlet obstruction by food, cleared by EGD   Family history of adverse reaction to anesthesia    a.) daughter with (+) PONV   Gastric bypass status for obesity    GERD (gastroesophageal reflux disease)    Headache(784.0)    HLD (hyperlipidemia)    Hypertension    IBS (irritable bowel syndrome)    IDA (iron deficiency anemia)    a.) post 2 unit txfsn 2009, normal endo/colonoscopy by [facility]   Left bundle branch block (LBBB)    a.) Intermittently present - likely rate related.   NSVT (nonsustained ventricular tachycardia) 11/15/2020   a.) Holter study 11/15/2020; 11 episodes of NSVT lasting up to 8 beats with a maximum rate of 210 bpm; 14 atrial runs lasting up to 18 beats with a rate of up to 250 bpm.  Some atrial runs felt to be NSVT.   Obesity    OSA on CPAP    Polyneuropathy in  diabetes(357.2) 03/25/2013   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/09/2021   Right V1 distribution   Restless leg syndrome    Rotator cuff arthropathy, right 08/13/2017   Syncope and collapse 03/12/2014   Type II diabetes mellitus ([place])    Past Surgical History:  Procedure Laterality Date   APPENDECTOMY     BACK SURGERY     CHOLECYSTECTOMY N/A 1976   COLONOSCOPY     CORONARY ANGIOPLASTY     CORONARY STENT INTERVENTION N/A 01/04/2020   Procedure: CORONARY STENT INTERVENTION;  Surgeon: [name], MD;  Location: [place] [facility];  Service: Cardiovascular;  Laterality: N/A;  LAD    DIAPHRAGMATIC HERNIA REPAIR  2015   ESOPHAGEAL DILATION     multiple   ESOPHAGOGASTRODUODENOSCOPY (EGD) WITH PROPOFOL N/A 05/11/2021   Procedure: ESOPHAGOGASTRODUODENOSCOPY (EGD) WITH PROPOFOL;  Surgeon: [name], MD;   Location: [facility];  Service: Endoscopy;  Laterality: N/A;   ESOPHAGOGASTRODUODENOSCOPY (EGD) WITH PROPOFOL N/A 06/13/2021   Procedure: ESOPHAGOGASTRODUODENOSCOPY (EGD) WITH PROPOFOL;  Surgeon: [name], MD;  Location: [facility];  Service: Endoscopy;  Laterality: N/A;   EYE SURGERY     GASTRIC BYPASS  2000, 2005   Dr. [name] IMPLANT PLACEMENT  10/2011   [place]   LEFT HEART CATH AND CORONARY ANGIOGRAPHY N/A 04/1986   LEFT HEART CATH AND CORONARY ANGIOGRAPHY Left 07/24/2012   LEFT HEART CATH AND CORS/GRAFTS ANGIOGRAPHY N/A 12/31/2019   Procedure: LEFT HEART CATH AND CORS/GRAFTS ANGIOGRAPHY;  Surgeon: [name], MD;  Location: [place] [facility];  Service: Cardiovascular;  Laterality: N/A;   PANNICULECTOMY N/A 06/16/2019   Procedure: PANNICULECTOMY;  Surgeon: [name], MD;  Location: [place];  Service: Plastics;  Laterality: N/A;  3 hours, please   REVERSE SHOULDER ARTHROPLASTY Right 08/13/2017   Procedure: REVERSE RIGHT SHOULDER ARTHROPLASTY;  Surgeon: [name], MD;  Location: [place];  Service: Orthopedics;  Laterality: Right;   RIGHT/LEFT HEART CATH AND CORONARY ANGIOGRAPHY N/A 09/06/2020   Procedure: RIGHT/LEFT HEART CATH AND CORONARY ANGIOGRAPHY;  Surgeon: [name], MD;  Location: [place] [facility];  Service: Cardiovascular;  Laterality: N/A;   ROTATOR CUFF REPAIR Right    SPINE SURGERY  1995   [place]   TOTAL ABDOMINAL HYSTERECTOMY W/ BILATERAL SALPINGOOPHORECTOMY  1974   TOTAL KNEE ARTHROPLASTY Bilateral 2007   Procedure: TOTAL KNEE ARTHROPLASTY; Surgeons: Mauri Pole, MD and [name], MD   UMBILICAL HERNIA REPAIR  08/11/2015   Family History  Problem Relation Age of Onset   Heart disease Father    Hypertension Father    Prostate cancer Father    Stroke Father    Osteoporosis Father    Stroke Mother    Depression Mother    Headache Mother    Heart disease Mother    Thyroid disease Mother    Hypertension Mother    Diabetes  Daughter    Heart disease Daughter    Hypertension Daughter    Hypertension Son    Social History   Tobacco Use   Smoking status: Never    Passive exposure: Never   Smokeless tobacco: Never  Vaping Use   Vaping Use: Never used  Substance Use Topics   Alcohol use: No   Drug use: Not Currently    Comment: prescribed valium    Pertinent Clinical Results:  LABS: Labs reviewed: Acceptable for surgery.  No visits with results within 3 Day(s) from this visit.  Latest known visit with results is:  Clinical Support on 09/05/2021  Component Date  Value Ref Range Status   Sodium 09/05/2021 135  135 - 145 mmol/L Final   Potassium 09/05/2021 3.8  3.5 - 5.1 mmol/L Final   Chloride 09/05/2021 105  98 - 111 mmol/L Final   CO2 09/05/2021 21 (L)  22 - 32 mmol/L Final   Glucose, Bld 09/05/2021 220 (H)  70 - 99 mg/dL Final   Glucose reference range applies only to samples taken after fasting for at least 8 hours.   BUN 09/05/2021 15  8 - 23 mg/dL Final   Creatinine, Ser 09/05/2021 1.21 (H)  0.44 - 1.00 mg/dL Final   Calcium 09/05/2021 8.7 (L)  8.9 - 10.3 mg/dL Final   GFR, Estimated 09/05/2021 47 (L)  >60 mL/min Final   Comment: (NOTE) Calculated using the CKD-EPI Creatinine Equation (2021)    Anion gap 09/05/2021 9  5 - 15 Final   Performed at [facility], [address] [place], [place] [address]   WBC 09/05/2021 11.0 (H)  4.0 - 10.5 K/uL Final   RBC 09/05/2021 4.53  3.87 - 5.11 MIL/uL Final   Hemoglobin 09/05/2021 13.5  12.0 - 15.0 g/dL Final   HCT 09/05/2021 42.3  36.0 - 46.0 % Final   MCV 09/05/2021 93.4  80.0 - 100.0 fL Final   MCH 09/05/2021 29.8  26.0 - 34.0 pg Final   MCHC 09/05/2021 31.9  30.0 - 36.0 g/dL Final   RDW 09/05/2021 14.4  11.5 - 15.5 % Final   Platelets 09/05/2021 275  150 - 400 K/uL Final   nRBC 09/05/2021 0.0  0.0 - 0.2 % Final   Neutrophils Relative % 09/05/2021 61  % Final   Neutro Abs 09/05/2021 6.8  1.7 - 7.7 K/uL Final   Lymphocytes Relative  09/05/2021 31  % Final   Lymphs Abs 09/05/2021 3.4  0.7 - 4.0 K/uL Final   Monocytes Relative 09/05/2021 5  % Final   Monocytes Absolute 09/05/2021 0.6  0.1 - 1.0 K/uL Final   Eosinophils Relative 09/05/2021 1  % Final   Eosinophils Absolute 09/05/2021 0.1  0.0 - 0.5 K/uL Final   Basophils Relative 09/05/2021 1  % Final   Basophils Absolute 09/05/2021 0.1  0.0 - 0.1 K/uL Final   Immature Granulocytes 09/05/2021 1  % Final   Abs Immature Granulocytes 09/05/2021 0.07  0.00 - 0.07 K/uL Final   Performed at [facility], [address] [place],  [address]    ECG: Date: 08/18/2021 Time ECG obtained: 0810 AM Rate: 73 bpm Rhythm:  Sinus rhythm with frequent PACs Axis (leads I and aVF): Normal Intervals: QRS 66 ms. QTc 490 ms. ST segment and T wave changes: No evidence of acute ST segment elevation or depression.  Atrial ectopy has increased in frequency since previous tracing. Comparison: Similar to previous tracing obtained on 05/03/2021.   IMAGING / PROCEDURES: MYOCARDIAL PERFUSION IMAGING STUDY (LEXISCAN) performed on 01/26/2021 Hyperdynamic LVEF of 96% No evidence of stress-induced myocardial ischemia or arrhythmia CT attenuation correction images with no significant aortic atherosclerosis.  Stents noted in the LAD. Low risk scan  LONG TERM CARDIAC EVENT MONITOR STUDY performed on 11/15/2020 Predominant underlying sinus rhythm at an average heart rate of 77 bpm; range 53-121 bpm Rare PACs and PVCs 11 episodes of NSVT lasting up to 8 beats with a maximum heart rate of 210 bpm. 14 atrial runs lasting up to 18 beats with a maximal heart rate of 250 bpm Some episodes labeled as SVT with aberrancy could represent an SVT.  Patient triggered events correspond to sinus rhythm and sinus rhythm with PVCs and artifact.  RIGHT/LEFT HEART CATHETERIZATION AND CORONARY ANGIOGRAPHY performed on 09/06/2020 Nonobstructive CAD with a 40% stenosis noted in the ostial RCA Widely patent  ostial, proximal, and mid LAD stents with a distal LAD tapering to a small vessel Mean RA pressure 17 mmHg Mean PA pressure 27 mmHg PCWP 22 mmHg PA saturation 68% Cardiac output 3.9 L/min Cardiac index 1.8 L/min/m     TRANSTHORACIC ECHOCARDIOGRAM performed on 12/30/2019 Left ventricular ejection fraction, by estimation, is 65 to 70%. The left ventricle has normal function. The left ventricle has no regional wall motion abnormalities. Left ventricular diastolic parameters are consistent with Grade II diastolic dysfunction (pseudonormalization).  Right ventricular systolic function is normal. The right ventricular size is normal.  The mitral valve is normal in structure. No evidence of mitral valve regurgitation. No evidence of mitral stenosis.  The aortic valve is normal in structure. Aortic valve regurgitation is not visualized. No aortic stenosis is present.   Impression and Plan:  Denise Sharp has been referred for pre-anesthesia review and clearance prior to her undergoing the planned anesthetic and procedural courses. Available labs, pertinent testing, and imaging results were personally reviewed by me. This patient has been appropriately cleared by cardiology with an overall ACCEPTABLE risk of significant perioperative cardiovascular complications.  Based on clinical review performed today (09/13/21), barring any significant acute changes in the patient's overall condition, it is anticipated that she will be able to proceed with the planned surgical intervention. Any acute changes in clinical condition may necessitate her procedure being postponed and/or cancelled. Patient will meet with anesthesia team (MD and/or CRNA) on the day of her procedure for preoperative evaluation/assessment. Questions regarding anesthetic course will be fielded at that time.   Pre-surgical instructions were reviewed with the patient during her PAT appointment and questions were fielded by PAT clinical  staff. Patient was advised that if any questions or concerns arise prior to her procedure then she should return a call to PAT and/or her surgeon's office to discuss.  Honor Loh, MSN, APRN, FNP-C, CEN [facility]  Peri-operative Services Nurse Practitioner Phone: [phone] Fax: [phone] 09/13/21 4:23 PM  NOTE: This note has been prepared using Dragon dictation software. Despite my best ability to proofread, there is always the potential that unintentional transcriptional errors may still occur from this process.

## 2021-09-15 ENCOUNTER — Other Ambulatory Visit: Payer: Self-pay

## 2021-09-15 ENCOUNTER — Ambulatory Visit (INDEPENDENT_AMBULATORY_CARE_PROVIDER_SITE_OTHER): Payer: Medicare Other | Admitting: Pharmacist

## 2021-09-15 DIAGNOSIS — I2510 Atherosclerotic heart disease of native coronary artery without angina pectoris: Secondary | ICD-10-CM

## 2021-09-15 DIAGNOSIS — N183 Chronic kidney disease, stage 3 unspecified: Secondary | ICD-10-CM

## 2021-09-15 DIAGNOSIS — E1122 Type 2 diabetes mellitus with diabetic chronic kidney disease: Secondary | ICD-10-CM

## 2021-09-15 DIAGNOSIS — E1140 Type 2 diabetes mellitus with diabetic neuropathy, unspecified: Secondary | ICD-10-CM

## 2021-09-15 MED ORDER — NOVOLOG FLEXPEN 100 UNIT/ML ~~LOC~~ SOPN: PEN_INJECTOR | SUBCUTANEOUS | 3 refills | Status: DC

## 2021-09-15 NOTE — Patient Instructions (Addendum)
Sujey,  ? ?Keep up the great work! ? ?Continue Farxiga 10 mg daily and Tresiba 8 units at bedtime.  ? ?Increase the Novolog sliding scale:  ?- If pre-meal glucose <100, inject 4 units; ?- 100-150 - inject 6 units ?- 151-200 - inject 8 units ?- 201-250 - inject 10 units ?- 251-300 - inject 12 units ?- 301-350 - inject 14 units  ?- 350 > inject 16 units ? ?Schedule follow up with [name] in office in about 6-8 weeks.  ? ?Take care! ? ?Catie Darnelle Maffucci, PharmD ? ?Visit Information ? ?Following are the goals we discussed today:  ?  ?Patient Goals/Self-Care Activities ?Over the next 90 days, patient will:  ?- take medications as prescribed ?check blood glucose at least three times daily using CGM, document, and provide at future appointments ?   ?  ?  ?Plan: Telephone follow up appointment with care management team member scheduled for:  3 months ?  ?Catie Darnelle Maffucci, PharmD, BCACP, CPP ?Clinical Pharmacist ?Therapist, music at [employer] ?[phone] ?  ? ? ?Please call the care guide team at [phone] if you need to cancel or reschedule your appointment.  ? ?Print copy of patient instructions, educational materials, and care plan provided in person.  ? ?

## 2021-09-15 NOTE — Chronic Care Management (AMB) (Signed)
Chronic Care Management CCM Pharmacy Note  09/15/2021 Name:  Denise Sharp MRN:  [id number] DOB:  Nov 29, 1945  Summary: - Tolerating regimen. Some post prandial elevations  Recommendations/Changes made from today's visit: - Increase Novolog sliding scale  Subjective: Denise Sharp is an 76 y.o. year old female who is a primary patient of Tullo, Aris Everts, MD.  The CCM team was consulted for assistance with disease management and care coordination needs.    Engaged with patient face to face for follow up visit for pharmacy case management and/or care coordination services.   Objective:  Medications Reviewed Today     Reviewed by Desmond Lope, RN (Registered Nurse) on 09/12/21 at 82  Med List Status: <None>   Medication Order Taking? Sig Documenting Provider Last Dose Status Informant  aspirin EC 81 MG tablet [id number] Yes Take 1 tablet (81 mg total) by mouth daily. Fritzi Mandes, MD Taking Active Self  atorvastatin (LIPITOR) 80 MG tablet [id number] Yes Take 1 tablet (80 mg total) by mouth daily. End, Harrell Gave, MD Taking Active Self  clotrimazole-betamethasone (LOTRISONE) cream [id number] Yes Apply 1 application topically 2 (two) times daily as needed (irritation). [provider] Taking Active Self  Continuous Blood Gluc Receiver (FREESTYLE LIBRE 2 READER) DEVI [id number] Yes Use to check sugar at least TID [name], MD Taking Active Self  Continuous Blood Gluc Sensor (FREESTYLE LIBRE 2 SENSOR) MISC [id number] Yes 1 application by Does not apply route every 14 (fourteen) days. [name], MD Taking Active Self  cyanocobalamin (,VITAMIN B-12,) 1000 MCG/ML injection [id number] Yes Inject 1 mL (1,000 mcg total) into the muscle every 30 (thirty) days. Inject 1 ml (1000 mcg ) IM weekly x 4,  Then monthly thereafter [name], MD Taking Active Self  dapagliflozin propanediol (FARXIGA) 10 MG TABS tablet [id number] Yes Take 1 tablet (10 mg total) by mouth  daily. [name], MD Taking Active Self  dicyclomine (BENTYL) 20 MG tablet [id number] Yes Take 1 tablet (20 mg total) by mouth 3 (three) times daily before meals. [name], MD Taking Active Self  diphenoxylate-atropine (LOMOTIL) 2.5-0.025 MG tablet [id number] Yes Take 1 tablet by mouth 4 (four) times daily as needed for diarrhea or loose stools. [name], MD Taking Active Self  fexofenadine (ALLEGRA) 180 MG tablet [id number] Yes Take 180 mg by mouth as needed for allergies or rhinitis. [provider] Taking Active Self  insulin aspart (NOVOLOG FLEXPEN) 100 UNIT/ML FlexPen [id number] Yes Inject 10-20 units with supper: If pre-meal glucose 100-150 - inject 4 units, 151-200 - inject 6 units; 201-250 - inject 8 units; 251-300 - inject 10 units, 301-350 - inject 12 units ; 350 > inject 14 units Tullo, Aris Everts, MD Taking Active Self  insulin degludec (TRESIBA FLEXTOUCH) 100 UNIT/ML FlexTouch Pen [id number] Yes Inject 8 Units into the skin daily. [name], MD Taking Active Self  Insulin Pen Needle (DROPLET PEN NEEDLES) 31G X 5 MM MISC [id number] Yes USE ONE NEEDLE SUBCUTANEOUSLY AS DIRECTED (REMOVE AND DISCARD NEEDLE IN SHARPS CONTAINER IMMEDIATELY AFTER USE) [name], MD Taking Active Self  Multiple Vitamins-Minerals (CENTRUM SILVER 50+WOMEN PO) [id number] Yes Take 1 tablet by mouth daily. [provider] Taking Active Self  nitroGLYCERIN (NITROSTAT) 0.4 MG SL tablet [id number] Yes Place 1 tablet (0.4 mg total) under the tongue every 5 (five) minutes as needed for chest pain. Theora Gianotti, NP Taking Active Self  nystatin (MYCOSTATIN/NYSTOP) powder [id number] Yes Apply 1 g topically  4 (four) times daily as needed (rash). [provider] Taking Active Self  ondansetron (ZOFRAN-ODT) 4 MG disintegrating tablet [id number] Yes Take 1 tablet (4 mg total) by mouth every 8 (eight) hours as needed for nausea or vomiting. [name], MD Taking Active Self   OVER THE COUNTER MEDICATION [id number] Yes Apply 1 application topically daily as needed (pain). [place] Gel topical pain reliever [provider] Taking Active Self  pantoprazole (PROTONIX) 40 MG tablet [id number] Yes TAKE 1 TABLET BY MOUTH EVERY DAY [name], MD Taking Active Self  potassium chloride SA (KLOR-CON M) 20 MEQ tablet [id number] Yes Take 20 mEq by mouth once. [provider] Taking Active Self  pregabalin (LYRICA) 50 MG capsule [id number] Yes Take 1 capsule (50 mg total) by mouth 2 (two) times daily. [name], MD Taking Active Self  sertraline (ZOLOFT) 25 MG tablet [id number] Yes Take 1 tablet (25 mg total) by mouth daily. [name], MD Taking Active Self  spironolactone (ALDACTONE) 25 MG tablet [id number] Yes Take 0.5 tablets (12.5 mg total) by mouth daily. [name], MD Taking Active Self  torsemide (DEMADEX) 20 MG tablet [id number]  Take 1 tablet (20 mg total) by mouth daily. [name], MD  Expired 09/05/21 2359 Self  Med List Note Modena Nunnery, [place] T, CPhT 07/22/17 1307): CPAP            Pertinent Labs:   Lab Results  Component Value Date   HGBA1C 7.8 (H) 08/10/2021   Lab Results  Component Value Date   CHOL 186 08/10/2021   HDL 43.10 08/10/2021   LDLCALC 108 (H) 08/10/2021   LDLDIRECT 111.0 06/16/2015   TRIG 175.0 (H) 08/10/2021   CHOLHDL 4 08/10/2021   Lab Results  Component Value Date   CREATININE 1.21 (H) 09/05/2021   BUN 15 09/05/2021   NA 135 09/05/2021   K 3.8 09/05/2021   CL 105 09/05/2021   CO2 21 (L) 09/05/2021    SDOH:  (Social Determinants of Health) assessments and interventions performed:  SDOH Interventions    Flowsheet Row Most Recent Value  SDOH Interventions   Financial Strain Interventions Intervention Not Indicated       CCM Care Plan  Review of patient past medical history, allergies, medications, health status, including review of consultants reports, laboratory and other test data, was  performed as part of comprehensive evaluation and provision of chronic care management services.   Care Plan : Medication Management  Updates made by De Hollingshead, RPH-CPP since 09/15/2021 12:00 AM     Problem: Diabetes, CHF      Long-Range Goal: Disease Progression Prevention   Recent Progress: On track  Priority: High  Note:   Current Barriers:  Unable to achieve control of diabetes   Pharmacist Clinical Goal(s):  Over the next 90 days, patient will achieve control of diabetes as evidenced by improvement in A1c through collaboration with PharmD and provider  Interventions: 1:1 collaboration with [name], MD regarding development and update of comprehensive plan of care as evidenced by provider attestation and co-signature Inter-disciplinary care team collaboration (see longitudinal plan of care) Comprehensive medication review performed; medication list updated in electronic medical record  Health Maintenance Yearly diabetic eye exam: due - reports she had done last week Yearly diabetic foot exam: due -  Urine microalbumin: due - ordered  Yearly influenza vaccination: due - received today Td/Tdap vaccination: up to date Pneumonia vaccination: up to date COVID vaccinations: up to date Shingrix vaccinations:  due - discussed today, recommended to receive Colonoscopy: up to date   Diabetes: Uncontrolled; current treatment: Farxiga 10 mg daily, Tresiba 8 units daily, Novolog per sliding scale: If pre-meal glucose 100-150 - inject 4 units, 151-200 - inject 6 units; 201-250 - inject 8 units; 251-300 - inject 10 units, 301-350 - inject 12 units ; 350 > inject 14 units Hx intolerance to metformin d/t diarrhea Hx Trulicity - exacerbated diarrhea  Hx glipizide - stopped previously when A1c was well controlled  Hx Januvia - stopped previously when Trulicity was started  Avoiding TZD due to HF Hx Ozempic - stopped due to GI discomfort Current glucose readings: using Libre  2 CGM Date of Download: 09/02/21-09/15/21 % Time CGM is active: 44% Average Glucose: 183 mg/dL Glucose Management Indicator: not enough data  Glucose Variability: 34.1 (goal <36%) Time in Goal:  - Time in range 70-180: 65% - Time above range: 35% - Time below range: 0% Observed patterns: post prandial elevations, relatively controlled fastings Continue Tresiba 8 units daily and Farxiga 10 mg daily Increase sliding scale: Inject 4-16 units with meals per sliding scale: If pre-meal glucose <100, inject 4 units; 100-150 - inject 6 units, 151-200 - inject 8 units; 201-250 - inject 10 units; 251-300 - inject 12 units, 301-350 - inject 14 units ; 350 > inject 16 units Schedule follow up with PCP in 6-8 weeks  Heart Failure, preserved:  Appropriately managed; current treatment:  ARNI/ACEi/ARB: none d/t intolerance  Beta blocker: unable to tolerate d/t hypotension SGLT2: Farxiga 10 mg daily Mineralocorticoid Receptor Antagonist: spironolactone 12.5 mg daily Diuretic: torsemide 20 mg daily + potassium 20 mEq daily   Previously recommended to continue current regimen at this time along with cardiology collaboration.   Hyperlipidemia, secondary ASCVD prevention: Controlled per last lab result; current treatment: atorvastatin 40 mg daily  Current antiplatelet regimen: aspirin 81 mg daily Previously recommended to continue current regimen at this time  Nocturnal "leg cramps", as well as chronic pain More exacerbated recently per patient report. Current regimen: pregabalin 50 mg BID, established with Dr. Jannifer Franklin neurology.  Previously recommended to continue current regimen at this time.  Chronic Diarrhea, S/p Gastric Bypass, Esophageal "growth" Moderately well managed, given patient history. Current regimen: lomotil 4 tabs daily and loperamide BID; omeprazole 20 mg daily, recently started dicyclomine 20 mg daily TID PRN Previously recommended to continue current regimen at this time  COPD,  allergies:  Well controlled; Current regimen: Anoro 62.5/25 mcg daily, albuterol HFA PRN; fexofenadine 180 mg daily  Previously recommended to continue current regimen at this time.  Patient Goals/Self-Care Activities Over the next 90 days, patient will:  - take medications as prescribed check blood glucose at least three times daily using CGM, document, and provide at future appointments      Plan: Telephone follow up appointment with care management team member scheduled for:  3 months  Catie Darnelle Maffucci, PharmD, [place], [place] Clinical Pharmacist [employer] [facility] [employer] [phone]

## 2021-09-17 NOTE — Progress Notes (Unsigned)
HPI Denise Sharp never smoker followed for OSA, Hypersomnia, Insomnia complicated by IBS NPSG 1/74/94 at [facility]/ Sleep Med   Mild OSA, AHI 13.2/ hr, weight 218 lbs   CPAP 6 NPSG 05/22/15- CPAP Titration study- Optimal 7 cwp,  MSLT 05/23/15 ( she had taken Lyrica the night before during CPAP titration against protocol). 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 Walk Test 02/19/2019- Min sat 97%, Max HR 117. PFT 05/04/19- WNL FEV1/FVC 88%, TLC 96%, DLCO 85%. 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 Melvyn Novas for pulmonary, complicated by DM 2/peripheral neuropathy, degenerative disc disease, DVT 2017,IBS, , CHF, CAD/ stents, HTN, Iron Def Anemia, hx Bariatric Surgery, -Ipratropium 0.06% nasal, eszopiclone 3, Ventolin hfa, Anoro,  CPAP 4-12/ [employer]>> today change to 4-15 Download-compliance 100%, AHI 1/ hr Body weight today- 257 lbs covid vax- 2 Moderna Flu vax-had -----OSA, can't tell if cpap has turned off, or working correct, tired during day, doesn't feel she is getting enough sleep Some wheeze and cough. DOE 1-2 rooms.  Worse reflux past 2 weeks, pending GI w/u. Known stricture. Getting IV iron.  CPAP isnt cutting off automatically anymore. Notes some wheeze and cough with DOE walking 1-2 rooms in home. Reflux worse in last 2 weeks. Known stricture- pending GI w/u. CXR 12/30/19-  IMPRESSION: Mild bibasilar atelectasis with likely scarring in the lingula. No other acute cardiopulmonary disease.  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 Melvyn Novas for pulmonary, complicated by DM 2/peripheral neuropathy,  degenerative disc disease, DVT 2017,I BS,  CHF, CAD/ stents, HTN, Iron Def Anemia, hx Bariatric Surgery, -Ipratropium 0.06% nasal, eszopiclone 3, Ventolin hfa, Anoro,  CPAP 4-15/ [employer] Download-compliance 93%, AHI 0.5/ hr Body weight today-239 lbs covid vax- 3 Moderna Flu vax-had -----Patient is doing good. No concerns   CT chest/abd/pelvis 02/08/21- IMPRESSION: 1. No acute findings in the chest, abdomen or pelvis. 2. Stable left adrenal gland adenoma. 3. Surgical changes from gastric bypass surgery. No complicating features are identified. 4. Status post cholecystectomy. No biliary dilatation.   ROS-see HPI   + = positive Constitutional:    weight loss, night sweats, fevers, chills,+ fatigue, lassitude. HEENT:   + headaches, +ifficulty swallowing, tooth/dental problems, sore throat,       sneezing, itching, ear ache, +nasal congestion, post nasal drip, snoring CV:    chest pain, orthopnea, PND, + swelling in lower extremities, anasarca,                                                    dizziness, palpitations Resp:  +shortness of breath with exertion or at rest.                productive cough,   +non-productive cough, coughing up of blood.              change in color of mucus. + wheezing.   Skin:    rash or lesions. GI:  + IBS GU: dysuria, change in color of urine, no urgency or frequency.  MS:   +joint  pain, stiffness, decreased range of motion, back pain. Neuro-     nothing unusual Psych:  change in mood or affect.  depression or anxiety.   memory loss.  OBJ- Physical Exam General- Alert, Oriented, Affect-appropriate, Distress- none acute, + morbidly obese, wheelchair Skin- rash-none, lesions- none, excoriation- none. + Herpetic fever blister lip Lymphadenopathy- none Head- atraumatic            Eyes- Gross vision intact, PERRLA, conjunctivae and secretions clear            Ears- Hearing, canals-normal            Nose- Clear, no-Septal dev, mucus, polyps, erosion,  perforation             Throat- Mallampati III , mucosa clear , drainage- none, tonsils- atrophic, + dentures Neck- flexible , trachea midline, no stridor , thyroid nl, carotid no bruit Chest - symmetrical excursion , unlabored           Heart/CV- RRR , no murmur , no gallop  , no rub, nl s1 s2                           - JVD- none , edema , stasis changes- none, varices- none           Lung- clear to P&A, wheeze- none, cough- none , dullness-none, rub- none, rales-none,                                Chest wall-  Abd-  Br/ Gen/ Rectal- Not done, not indicated Extrem- cyanosis- none, clubbing, none, atrophy- none, strength- nl, + cane Neuro- grossly intact to observation

## 2021-09-17 NOTE — H&P (Signed)
Recent hospital discharge.  Frequent falls.  Morbid obesity.  Chronic pain.  Peripheral neuropathy.  Extensive work-up.  Urine culture in May 2022 negative.  CT scan July 2022 normal kidneys   Saw the patient and I believe many years ago in [place].  She had failed multiple medications.  Because of obesity and other factors did not think she was a good candidate for refractory therapies.  It turns out she had InterStim 2015 in [place].  Almost completely dry can hold it for many hours.  She holds it too long she has a bit urge incontinence  Reassess with cystoscopy for hematuria.  InterStim working beautifully and does not need replacement and with this was discussed.    Patient recently had cystoscopy in August 2022 by Dr. Erlene Quan.  She had a lot of erythema and mucus with inflammation of the trigone and debris throughout.  She was treated with Bactrim and it was suggested to have repeat cystoscopy to make sure her erythema had resolved.  Recommended InterStim interrogation.  She did have a positive culture with multidrug resistance and had to be given fosfomycin.  Program was interrogated with nurse visit protocol set.  When she saw [name] in follow-up she was doing well.  Symptoms were much better.   Patient is completely dry.  Clinically not infected.  She is very pleased.  She has a lot of cardiac issues and we discussed her fluid pills     I will see her in 1 year.  If she starts leaking we first need to rule out bladder infection.  She does not need another cystoscopy since the findings were in keeping with a positive culture   Today I saw the patient September 2022.  Last urine culture in August 2022 was positive, In the last many weeks patient started having more urge incontinence.  She now has high-volume urge incontinence wearing 6-8 pads a day.  She will have flooding foot on the floor syndrome in the middle the night.  She has had some burning off and on for a few weeks.  She has  some tea colored urine.  Sometimes the urine is clear.  She has intermittent right lower quadrant and right flank discomfort down to the lower hip.  She is on doxycycline in the last week for a toe infection.   Prior to this after reprogramming last time she could hold urination for 12 hours.  She had a normal CT scan December 2022   According to the patient she still has the device that is not MRI compatible.  It sounds like the battery life was almost up last time it was interrogated.  I reviewed the interrogation note from August 2022.  I could not find specifics.  It appears that she had the IPG change October 25, 2014 and the lead may have been 76 years old at that time.   The patient is hoping to have MRIs.  Patient had device trouble suited by [employer] representative.  She still has minimal battery life left.  She has the new IPG but not the MRI compatible lead.  The device is functioning intermittently.  This corresponds to what what was told during last interrogation     PMH:     Past Medical History:  Diagnosis Date   (HFpEF) heart failure with preserved ejection fraction ([place])      a. 12/2019 Echo: EF 65-70%, Gr2 DD. No significant valvular dzs.   Abnormality of gait 03/25/2013   Adrenal  mass, left ([place])     Anemia      iron deficient post  2 unit txfsn 2009, normal endo/colonoscopy by [facility]   Arthritis     CAD (coronary artery disease) with h/o Atypical Chest Pain      a. 04/1986 Cath ([facility]): nl cors, EF 65%; b. 07/2012 Cath [facility]): Diff minor irregs; c. 07/2016 MV [facility]): "Equivocal"; d. 08/2016 Cardiac CT Ca2+ score [facility]): Ca2+ 6789; e. 05/2019 MV: No ischemia. EF 75%; f. 12/2019 PCI: LM nl, LAD 65ost (3.5x12 Resolute DES), 38m(2.75x12 Resolute DES),LCX/RCA nl; g. 08/2020 Cath: patent LAD stents, RCA 40ost, elev RH pressures, EF >65%.   Cervical spinal stenosis 1994    due to trauma to back ([employer] accident), has intermittent paralysis and parasthesias   Cervicogenic headache 03/23/2014    Depression     Dizziness      chronic dizziness   DJD (degenerative joint disease)      a. Chronic R shoulder pain pending R shoulder replacement 07/2017.   Esophageal stenosis March 2011    with transietn outlet obstruction by food, cleared by EGD    Family history of adverse reaction to anesthesia      daughter PONV   Gastric bypass status for obesity     Headache(784.0)     Hypertension     IBS (irritable bowel syndrome)     Left bundle branch block (LBBB)      a. Intermittently present - likely rate related. - patient denies   Obesity     Obstructive sleep apnea      using CPAP   Polyneuropathy in diabetes(357.2) 03/25/2013   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/09/2021    Right V1 distribution   Restless leg syndrome     Rotator cuff arthropathy, right 08/13/2017   Syncope and collapse 03/12/2014   Type II diabetes mellitus ([place]        Surgical History:      Past Surgical History:  Procedure Laterality Date   ABDOMINAL HYSTERECTOMY       APPENDECTOMY       BACK SURGERY       CARDIAC CATHETERIZATION       COLONOSCOPY       CORONARY ANGIOPLASTY       CORONARY STENT INTERVENTION N/A 01/04/2020    Procedure: CORONARY STENT INTERVENTION;  Surgeon: AWellington Hampshire MD;  [facility];  Service: Cardiovascular;  Laterality: N/A;  LAD    DIAPHRAGMATIC HERNIA REPAIR   2015   ESOPHAGEAL DILATION        multiple   ESOPHAGOGASTRODUODENOSCOPY       ESOPHAGOGASTRODUODENOSCOPY (EGD) WITH PROPOFOL N/A 05/11/2021    Procedure: ESOPHAGOGASTRODUODENOSCOPY (EGD) WITH PROPOFOL;  Surgeon: WLucilla Lame MD;  Location: [facility];  Service: Endoscopy;  Laterality: N/A;   ESOPHAGOGASTRODUODENOSCOPY (EGD) WITH PROPOFOL N/A 06/13/2021    Procedure: ESOPHAGOGASTRODUODENOSCOPY (EGD) WITH PROPOFOL;  Surgeon: WLucilla Lame MD;  Location: [facility] ENDOSCOPY;  Service: Endoscopy;  Laterality: N/A;   GALLBLADDER SURGERY   1976   GALLBLADDER SURGERY   resection   GASTRIC BYPASS        GASTRIC BYPASS   2000, 2005    Dr. REra SkeenIMPLANT PLACEMENT   10/2011    [place]   JOINT REPLACEMENT   2007    bilateral knee. [name],  [name]   LEFT HEART CATH AND CORS/GRAFTS ANGIOGRAPHY N/A 12/31/2019    Procedure: LEFT HEART CATH AND CORS/GRAFTS ANGIOGRAPHY;  Surgeon: GMinna Merritts MD;  Location:  [facility];  Service: Cardiovascular;  Laterality: N/A;   PANNICULECTOMY   06/16/2019   PANNICULECTOMY N/A 06/16/2019    Procedure: PANNICULECTOMY;  Surgeon: [name], MD;  Location: [facility];  Service: Plastics;  Laterality: N/A;  3 hours, please   REVERSE SHOULDER ARTHROPLASTY Right 08/13/2017    Procedure: REVERSE RIGHT SHOULDER ARTHROPLASTY;  Surgeon: [name], MD;  Location: [facility];  Service: Orthopedics;  Laterality: Right;   RIGHT/LEFT HEART CATH AND CORONARY ANGIOGRAPHY N/A 09/06/2020    Procedure: RIGHT/LEFT HEART CATH AND CORONARY ANGIOGRAPHY;  Surgeon: [name], MD;  Location: [place] [facility];  Service: Cardiovascular;  Laterality: N/A;   ROTATOR CUFF REPAIR        right   SPINE SURGERY   1995    [place]   TOTAL ABDOMINAL HYSTERECTOMY W/ BILATERAL SALPINGOOPHORECTOMY   7989   UMBILICAL HERNIA REPAIR   08/11/2015      Home Medications:  Allergies as of 08/07/2021         Reactions    Demeclocycline Hives    Erythromycin Nausea And Vomiting, Other (See Comments)    Severe irritable bowel    Flagyl [metronidazole] Nausea And Vomiting, Other (See Comments)    Severe irritable bowel    Glucophage [metformin Hcl] Nausea And Vomiting, Other (See Comments)    "Sick" "I won't take anything that has metformin in it"    Tetracyclines & Related Hives, Rash    Diovan [valsartan] Nausea Only         Sulfa Antibiotics Rash, Other (See Comments)    As child    Xanax [alprazolam] Other (See Comments)    Unknown reaction            Medication List           Accurate as of August 07, 2021  9:58 AM. If you have any questions, ask  your nurse or doctor.              aspirin EC 81 MG tablet Take 1 tablet (81 mg total) by mouth daily.    atorvastatin 40 MG tablet Commonly known as: LIPITOR Take 1 tablet (40 mg total) by mouth daily.    CENTRUM SILVER 50+WOMEN PO Take by mouth.    clotrimazole-betamethasone cream Commonly known as: LOTRISONE Apply 1 application topically 2 (two) times daily as needed.    cyanocobalamin 1000 MCG/ML injection Commonly known as: (VITAMIN B-12) Inject 1 mL (1,000 mcg total) into the muscle every 30 (thirty) days. Inject 1 ml (1000 mcg ) IM weekly x 4,  Then monthly thereafter    dapagliflozin propanediol 10 MG Tabs tablet Commonly known as: FARXIGA Take 1 tablet (10 mg total) by mouth daily.    dicyclomine 20 MG tablet Commonly known as: BENTYL Take 1 tablet (20 mg total) by mouth 3 (three) times daily before meals.    diphenoxylate-atropine 2.5-0.025 MG tablet Commonly known as: Lomotil Take 1 tablet by mouth 4 (four) times daily as needed for diarrhea or loose stools.    doxycycline 100 MG tablet Commonly known as: VIBRA-TABS Take 1 tablet (100 mg total) by mouth 2 (two) times daily.    Droplet Pen Needles 31G X 5 MM Misc Generic drug: Insulin Pen Needle USE ONE NEEDLE SUBCUTANEOUSLY AS DIRECTED (REMOVE AND DISCARD NEEDLE IN SHARPS CONTAINER IMMEDIATELY AFTER USE)    fexofenadine 180 MG tablet Commonly known as: ALLEGRA Take 180 mg by mouth as needed for allergies or rhinitis.    FreeStyle  Libre 2 Reader Kerrin Mo Use to check sugar at least TID    FreeStyle Libre 2 Sensor Misc 1 application by Does not apply route every 14 (fourteen) days.    hydrocortisone 25 MG suppository Commonly known as: ANUSOL-HC Place 1 suppository (25 mg total) rectally at bedtime.    Lidocaine 4 % Ptch Apply topically as needed (pain).    neomycin-polymyxin b-dexamethasone 3.5-10000-0.1 Susp Commonly known as: MAXITROL Place 1 drop into the left eye 2 (two) times daily.     nitroGLYCERIN 0.4 MG SL tablet Commonly known as: NITROSTAT Place 1 tablet (0.4 mg total) under the tongue every 5 (five) minutes as needed for chest pain.    NONFORMULARY OR COMPOUNDED [place]  Combination Pain Cream -  Baclofen 2%, Doxepin 5%, Gabapentin 6%, Topiramate 2%, Pentoxifylline 3% Apply 1-2 grams to affected area 3-4 times daily Qty. 120 gm 3 refills    NovoLOG FlexPen 100 UNIT/ML FlexPen Generic drug: insulin aspart Inject 10-20 units with supper: If pre-meal glucose 100-150 - inject 4 units, 151-200 - inject 6 units; 201-250 - inject 8 units; 251-300 - inject 10 units, 301-350 - inject 12 units ; 350 > inject 14 units    nystatin powder Commonly known as: MYCOSTATIN/NYSTOP Apply 1 g topically 4 (four) times daily as needed (rash).    ondansetron 4 MG disintegrating tablet Commonly known as: ZOFRAN-ODT Take 1 tablet (4 mg total) by mouth every 8 (eight) hours as needed for nausea or vomiting.    pantoprazole 40 MG tablet Commonly known as: PROTONIX TAKE 1 TABLET BY MOUTH EVERY DAY    potassium chloride SA 20 MEQ tablet Commonly known as: KLOR-CON M Take 20 mEq by mouth once.    pregabalin 50 MG capsule Commonly known as: LYRICA Take 1 capsule (50 mg total) by mouth 2 (two) times daily.    spironolactone 25 MG tablet Commonly known as: ALDACTONE Take 0.5 tablets (12.5 mg total) by mouth daily.    torsemide 20 MG tablet Commonly known as: DEMADEX Take 1 tablet (20 mg total) by mouth daily. What changed: how much to take    Tresiba FlexTouch 100 UNIT/ML FlexTouch Pen Generic drug: insulin degludec Inject 8 Units into the skin daily.             Allergies:       Allergies  Allergen Reactions   Demeclocycline Hives   Erythromycin Nausea And Vomiting and Other (See Comments)      Severe irritable bowel   Flagyl [Metronidazole] Nausea And Vomiting and Other (See Comments)      Severe irritable bowel   Glucophage [Metformin Hcl]  Nausea And Vomiting and Other (See Comments)      "Sick" "I won't take anything that has metformin in it"   Tetracyclines & Related Hives and Rash   Diovan [Valsartan] Nausea Only          Sulfa Antibiotics Rash and Other (See Comments)      As child   Xanax [Alprazolam] Other (See Comments)      Unknown reaction      Family History:      Family History  Problem Relation Age of Onset   Heart disease Father     Hypertension Father     Prostate cancer Father     Stroke Father     Osteoporosis Father     Stroke Mother     Depression Mother     Headache Mother     Heart disease Mother  Thyroid disease Mother     Hypertension Mother     Diabetes Daughter     Heart disease Daughter     Hypertension Daughter     Hypertension Son        Social History:  reports that she has never smoked. She has never used smokeless tobacco. She reports that she does not currently use drugs. She reports that she does not drink alcohol.   ROS:                           Physical Exam: There were no vitals taken for this visit.  Constitutional:  Alert and oriented, No acute distress. HEENT: [place] AT, moist mucus membranes.  Trachea midline, no masses.   Laboratory Data: Recent Labs       Lab Results  Component Value Date    WBC 13.0 (H) 04/26/2021    HGB 13.7 04/26/2021    HCT 42.0 04/26/2021    MCV 96.8 04/26/2021    PLT 308 04/26/2021        Recent Labs       Lab Results  Component Value Date    CREATININE 1.30 (H) 06/27/2021        Recent Labs  No results found for: PSA     Recent Labs  No results found for: TESTOSTERONE     Recent Labs       Lab Results  Component Value Date    HGBA1C 8.9 (H) 02/08/2021        Urinalysis Labs (Brief)          Component Value Date/Time    COLORURINE YELLOW 02/17/2021 1008    APPEARANCEUR Hazy (A) 02/22/2021 1138    LABSPEC >=1.030 (A) 02/17/2021 1008    LABSPEC 1.006 06/21/2014 1623    PHURINE 5.0 02/17/2021  1008    GLUCOSEU 3+ (A) 02/22/2021 1138    GLUCOSEU 500 (A) 02/17/2021 1008    HGBUR NEGATIVE 02/17/2021 1008    BILIRUBINUR Negative 02/22/2021 1138    BILIRUBINUR Negative 06/21/2014 1623    KETONESUR NEGATIVE 02/17/2021 1008    PROTEINUR Negative 02/22/2021 1138    PROTEINUR NEGATIVE 02/08/2021 1323    UROBILINOGEN 0.2 02/17/2021 1008    NITRITE Negative 02/22/2021 1138    NITRITE NEGATIVE 02/17/2021 1008    LEUKOCYTESUR Negative 02/22/2021 1138    LEUKOCYTESUR SMALL (A) 02/17/2021 1008    LEUKOCYTESUR Negative 06/21/2014 1623        Pertinent Imaging:     Assessment & Plan: Patient may have a bladder infection.  Patient has poorly functioning InterStim.  Battery is over 28 years old.  Lead likely is 76 years old.  Explantation described.  Retained tip and sequelae with MRI described.  Higher infection rates described.  Full template utilized.  Patient understands I cannot promise the same excellent results.  She will need medical clearance and I think she seen her cardiologist in February 2023   I went through the entire template that I normally document in my medical records in [place].  Call if urine culture is positive

## 2021-09-18 ENCOUNTER — Ambulatory Visit: Payer: Medicare Other | Admitting: Urgent Care

## 2021-09-18 ENCOUNTER — Other Ambulatory Visit: Payer: Self-pay

## 2021-09-18 ENCOUNTER — Ambulatory Visit
Admission: RE | Admit: 2021-09-18 | Discharge: 2021-09-18 | Disposition: A | Discharge status: Home / Self Care | Payer: Medicare Other | Attending: Urology | Admitting: Urology

## 2021-09-18 ENCOUNTER — Ambulatory Visit: Payer: Medicare Other

## 2021-09-18 ENCOUNTER — Encounter: Payer: Self-pay | Admitting: Urology

## 2021-09-18 ENCOUNTER — Encounter
Admission: RE | Disposition: A | Discharge status: Home / Self Care | Payer: Self-pay | Source: Home / Self Care | Attending: Urology

## 2021-09-18 DIAGNOSIS — Z794 Long term (current) use of insulin: Secondary | ICD-10-CM | POA: Insufficient documentation

## 2021-09-18 DIAGNOSIS — I5032 Chronic diastolic (congestive) heart failure: Secondary | ICD-10-CM | POA: Diagnosis not present

## 2021-09-18 DIAGNOSIS — N39498 Other specified urinary incontinence: Secondary | ICD-10-CM | POA: Diagnosis not present

## 2021-09-18 DIAGNOSIS — Z955 Presence of coronary angioplasty implant and graft: Secondary | ICD-10-CM | POA: Diagnosis not present

## 2021-09-18 DIAGNOSIS — Z9889 Other specified postprocedural states: Secondary | ICD-10-CM | POA: Diagnosis not present

## 2021-09-18 DIAGNOSIS — I110 Hypertensive heart disease with heart failure: Secondary | ICD-10-CM | POA: Diagnosis not present

## 2021-09-18 DIAGNOSIS — I2510 Atherosclerotic heart disease of native coronary artery without angina pectoris: Secondary | ICD-10-CM | POA: Diagnosis not present

## 2021-09-18 DIAGNOSIS — N3941 Urge incontinence: Secondary | ICD-10-CM | POA: Insufficient documentation

## 2021-09-18 DIAGNOSIS — T83110A Breakdown (mechanical) of urinary electronic stimulator device, initial encounter: Secondary | ICD-10-CM

## 2021-09-18 DIAGNOSIS — T85193A Other mechanical complication of implanted electronic neurostimulator, generator, initial encounter: Secondary | ICD-10-CM | POA: Diagnosis not present

## 2021-09-18 DIAGNOSIS — E1042 Type 1 diabetes mellitus with diabetic polyneuropathy: Secondary | ICD-10-CM | POA: Diagnosis not present

## 2021-09-18 DIAGNOSIS — G4733 Obstructive sleep apnea (adult) (pediatric): Secondary | ICD-10-CM | POA: Insufficient documentation

## 2021-09-18 DIAGNOSIS — T85615A Breakdown (mechanical) of other nervous system device, implant or graft, initial encounter: Secondary | ICD-10-CM | POA: Insufficient documentation

## 2021-09-18 DIAGNOSIS — K219 Gastro-esophageal reflux disease without esophagitis: Secondary | ICD-10-CM | POA: Diagnosis not present

## 2021-09-18 DIAGNOSIS — T83198A Other mechanical complication of other urinary devices and implants, initial encounter: Secondary | ICD-10-CM | POA: Diagnosis not present

## 2021-09-18 DIAGNOSIS — Z6839 Body mass index (BMI) 39.0-39.9, adult: Secondary | ICD-10-CM | POA: Insufficient documentation

## 2021-09-18 DIAGNOSIS — Y758 Miscellaneous neurological devices associated with adverse incidents, not elsewhere classified: Secondary | ICD-10-CM | POA: Insufficient documentation

## 2021-09-18 DIAGNOSIS — E785 Hyperlipidemia, unspecified: Secondary | ICD-10-CM | POA: Diagnosis not present

## 2021-09-18 DIAGNOSIS — Z419 Encounter for procedure for purposes other than remedying health state, unspecified: Secondary | ICD-10-CM

## 2021-09-18 HISTORY — DX: Obstructive sleep apnea (adult) (pediatric): G47.33

## 2021-09-18 HISTORY — DX: Iron deficiency anemia, unspecified: D50.9

## 2021-09-18 HISTORY — DX: Hyperlipidemia, unspecified: E78.5

## 2021-09-18 HISTORY — PX: INTERSTIM IMPLANT REMOVAL: SHX5131

## 2021-09-18 HISTORY — PX: INTERSTIM IMPLANT PLACEMENT: SHX5130

## 2021-09-18 HISTORY — DX: Diverticulosis of intestine, part unspecified, without perforation or abscess without bleeding: K57.90

## 2021-09-18 HISTORY — DX: Anxiety disorder, unspecified: F41.9

## 2021-09-18 HISTORY — DX: Atherosclerosis of aorta: I70.0

## 2021-09-18 HISTORY — DX: Chronic kidney disease, stage 3 unspecified: N18.30

## 2021-09-18 HISTORY — DX: Obstructive sleep apnea (adult) (pediatric): Z99.89

## 2021-09-18 LAB — GLUCOSE, CAPILLARY
Glucose-Capillary: 138 mg/dL — ABNORMAL HIGH (ref 70–99)
Glucose-Capillary: 163 mg/dL — ABNORMAL HIGH (ref 70–99)

## 2021-09-18 SURGERY — REMOVAL, NEUROSTIMULATOR, SACRAL
Anesthesia: General | Site: Back

## 2021-09-18 MED ORDER — GENTAMICIN SULFATE 40 MG/ML IJ SOLN
5.0000 mg/kg | INTRAVENOUS | Status: AC
  Administered 2021-09-18: 540 mg via INTRAVENOUS
  Filled 2021-09-18: qty 13.5

## 2021-09-18 MED ORDER — LIDOCAINE-EPINEPHRINE 1 %-1:100000 IJ SOLN
INTRAMUSCULAR | Status: AC
  Filled 2021-09-18: qty 1

## 2021-09-18 MED ORDER — BUPIVACAINE-EPINEPHRINE (PF) 0.5% -1:200000 IJ SOLN
INTRAMUSCULAR | Status: AC
  Filled 2021-09-18: qty 30

## 2021-09-18 MED ORDER — ORAL CARE MOUTH RINSE: 15.0000 mL | Freq: Once | OROMUCOSAL | Status: AC

## 2021-09-18 MED ORDER — ONDANSETRON HCL 4 MG/2ML IJ SOLN: 4.0000 mg | Freq: Once | INTRAMUSCULAR | Status: DC | PRN

## 2021-09-18 MED ORDER — EPHEDRINE 5 MG/ML INJ
INTRAVENOUS | Status: AC
  Filled 2021-09-18: qty 5

## 2021-09-18 MED ORDER — ONDANSETRON HCL 4 MG/2ML IJ SOLN
INTRAMUSCULAR | Status: DC | PRN
Start: 2021-09-18 — End: 2021-09-18
  Administered 2021-09-18: 4 mg via INTRAVENOUS

## 2021-09-18 MED ORDER — CEPHALEXIN 500 MG PO CAPS: 500.0000 mg | ORAL_CAPSULE | Freq: Three times a day (TID) | ORAL | 0 refills | Status: DC

## 2021-09-18 MED ORDER — EPHEDRINE SULFATE (PRESSORS) 50 MG/ML IJ SOLN
INTRAMUSCULAR | Status: DC | PRN
  Administered 2021-09-18 (×5): 5 mg via INTRAVENOUS

## 2021-09-18 MED ORDER — CHLORHEXIDINE GLUCONATE 0.12 % MT SOLN
OROMUCOSAL | Status: AC
  Administered 2021-09-18: 15 mL via OROMUCOSAL
  Filled 2021-09-18: qty 15

## 2021-09-18 MED ORDER — PHENYLEPHRINE HCL-NACL 20-0.9 MG/250ML-% IV SOLN
INTRAVENOUS | Status: AC
  Filled 2021-09-18: qty 250

## 2021-09-18 MED ORDER — VANCOMYCIN HCL IN DEXTROSE 1-5 GM/200ML-% IV SOLN
INTRAVENOUS | Status: AC
  Administered 2021-09-18: 1000 mg via INTRAVENOUS
  Filled 2021-09-18: qty 200

## 2021-09-18 MED ORDER — ONDANSETRON HCL 4 MG/2ML IJ SOLN
INTRAMUSCULAR | Status: AC
  Filled 2021-09-18: qty 2

## 2021-09-18 MED ORDER — PROPOFOL 500 MG/50ML IV EMUL
INTRAVENOUS | Status: DC | PRN
  Administered 2021-09-18: 25 ug/kg/min via INTRAVENOUS

## 2021-09-18 MED ORDER — SODIUM CHLORIDE 0.9 % IV SOLN: INTRAVENOUS | Status: DC

## 2021-09-18 MED ORDER — FENTANYL CITRATE (PF) 100 MCG/2ML IJ SOLN: 25.0000 ug | INTRAMUSCULAR | Status: DC | PRN

## 2021-09-18 MED ORDER — PHENYLEPHRINE HCL (PRESSORS) 10 MG/ML IV SOLN
INTRAVENOUS | Status: AC
  Filled 2021-09-18: qty 1

## 2021-09-18 MED ORDER — FENTANYL CITRATE (PF) 100 MCG/2ML IJ SOLN
INTRAMUSCULAR | Status: DC | PRN
  Administered 2021-09-18 (×4): 25 ug via INTRAVENOUS

## 2021-09-18 MED ORDER — HYDROCODONE-ACETAMINOPHEN 5-325 MG PO TABS: 1.0000 | ORAL_TABLET | Freq: Four times a day (QID) | ORAL | 0 refills | Status: DC | PRN

## 2021-09-18 MED ORDER — LIDOCAINE-EPINEPHRINE 1 %-1:100000 IJ SOLN
INTRAMUSCULAR | Status: DC | PRN
  Administered 2021-09-18: 30 mL

## 2021-09-18 MED ORDER — FENTANYL CITRATE (PF) 100 MCG/2ML IJ SOLN
INTRAMUSCULAR | Status: AC
  Filled 2021-09-18: qty 2

## 2021-09-18 MED ORDER — LIDOCAINE HCL (CARDIAC) PF 100 MG/5ML IV SOSY
PREFILLED_SYRINGE | INTRAVENOUS | Status: DC | PRN
  Administered 2021-09-18: 100 mg via INTRAVENOUS

## 2021-09-18 MED ORDER — PROPOFOL 10 MG/ML IV BOLUS
INTRAVENOUS | Status: DC | PRN
  Administered 2021-09-18: 20 mg via INTRAVENOUS
  Administered 2021-09-18: 30 mg via INTRAVENOUS
  Administered 2021-09-18: 20 mg via INTRAVENOUS

## 2021-09-18 MED ORDER — DEXMEDETOMIDINE (PRECEDEX) IN NS 20 MCG/5ML (4 MCG/ML) IV SYRINGE
PREFILLED_SYRINGE | INTRAVENOUS | Status: DC | PRN
  Administered 2021-09-18 (×4): 8 ug via INTRAVENOUS

## 2021-09-18 MED ORDER — CHLORHEXIDINE GLUCONATE 0.12 % MT SOLN: 15.0000 mL | Freq: Once | OROMUCOSAL | Status: AC

## 2021-09-18 MED ORDER — MIDAZOLAM HCL 2 MG/2ML IJ SOLN
INTRAMUSCULAR | Status: AC
  Filled 2021-09-18: qty 2

## 2021-09-18 MED ORDER — PHENYLEPHRINE 40 MCG/ML (10ML) SYRINGE FOR IV PUSH (FOR BLOOD PRESSURE SUPPORT)
PREFILLED_SYRINGE | INTRAVENOUS | Status: AC
  Filled 2021-09-18: qty 10

## 2021-09-18 MED ORDER — VANCOMYCIN HCL IN DEXTROSE 1-5 GM/200ML-% IV SOLN
1000.0000 mg | INTRAVENOUS | Status: AC
  Administered 2021-09-18: 1000 mg via INTRAVENOUS

## 2021-09-18 MED ORDER — MIDAZOLAM HCL 2 MG/2ML IJ SOLN
INTRAMUSCULAR | Status: DC | PRN
  Administered 2021-09-18: 2 mg via INTRAVENOUS

## 2021-09-18 MED ORDER — STERILE WATER FOR IRRIGATION IR SOLN
Status: DC | PRN
  Administered 2021-09-18: 1000 mL

## 2021-09-18 MED ORDER — PROPOFOL 10 MG/ML IV BOLUS
INTRAVENOUS | Status: AC
  Filled 2021-09-18: qty 20

## 2021-09-18 MED ORDER — PHENYLEPHRINE 40 MCG/ML (10ML) SYRINGE FOR IV PUSH (FOR BLOOD PRESSURE SUPPORT)
PREFILLED_SYRINGE | INTRAVENOUS | Status: DC | PRN
  Administered 2021-09-18: 120 ug via INTRAVENOUS
  Administered 2021-09-18: 160 ug via INTRAVENOUS
  Administered 2021-09-18 (×3): 80 ug via INTRAVENOUS
  Administered 2021-09-18: 40 ug via INTRAVENOUS
  Administered 2021-09-18 (×2): 80 ug via INTRAVENOUS

## 2021-09-18 SURGICAL SUPPLY — 67 items
ADH SKN CLS APL DERMABOND .7 (GAUZE/BANDAGES/DRESSINGS) ×1
APL PRP STRL LF DISP 70% ISPRP (MISCELLANEOUS) ×1
APL SKNCLS STERI-STRIP NONHPOA (GAUZE/BANDAGES/DRESSINGS)
BENZOIN TINCTURE PRP APPL 2/3 (GAUZE/BANDAGES/DRESSINGS) IMPLANT
BLADE SURG 15 STRL LF DISP TIS (BLADE) ×2 IMPLANT
BLADE SURG 15 STRL SS (BLADE) ×2
BNDG ADH 2 X3.75 FABRIC TAN LF (GAUZE/BANDAGES/DRESSINGS) IMPLANT
BNDG ADH XL 3.75X2 STRCH LF (GAUZE/BANDAGES/DRESSINGS)
CHLORAPREP W/TINT 26 (MISCELLANEOUS) ×3 IMPLANT
COVER BACK TABLE REUSABLE LG (DRAPES) ×3 IMPLANT
COVER MAYO STAND REUSABLE (DRAPES) ×3 IMPLANT
COVER PROBE FLX POLY STRL (MISCELLANEOUS) ×3 IMPLANT
DERMABOND ADVANCED (GAUZE/BANDAGES/DRESSINGS) ×1
DERMABOND ADVANCED .7 DNX12 (GAUZE/BANDAGES/DRESSINGS) ×2 IMPLANT
DRAPE 3/4 80X56 (DRAPES) ×3 IMPLANT
DRAPE C-ARM 42X72 X-RAY (DRAPES) ×6 IMPLANT
DRAPE C-ARM XRAY 36X54 (DRAPES) ×3 IMPLANT
DRAPE C-ARMOR (DRAPES) ×3 IMPLANT
DRAPE FLUOR MINI C-ARM 54X84 (DRAPES) ×3 IMPLANT
DRAPE INCISE 23X17 IOBAN STRL (DRAPES) ×1
DRAPE INCISE 23X17 STRL (DRAPES) ×2 IMPLANT
DRAPE INCISE IOBAN 23X17 STRL (DRAPES) ×1 IMPLANT
DRAPE INCISE IOBAN 66X45 STRL (DRAPES) ×3 IMPLANT
DRAPE LAPAROSCOPIC ABDOMINAL (DRAPES) ×3 IMPLANT
DRAPE LAPAROTOMY 100X77 ABD (DRAPES) ×3 IMPLANT
DRSG TEGADERM 2-3/8X2-3/4 SM (GAUZE/BANDAGES/DRESSINGS) ×3 IMPLANT
DRSG TEGADERM 4X4.75 (GAUZE/BANDAGES/DRESSINGS) ×3 IMPLANT
DRSG TELFA 3X8 NADH (GAUZE/BANDAGES/DRESSINGS) ×2 IMPLANT
ELECT REM PT RETURN 9FT ADLT (ELECTROSURGICAL) ×2
ELECTRODE REM PT RTRN 9FT ADLT (ELECTROSURGICAL) ×2 IMPLANT
GAUZE 4X4 16PLY ~~LOC~~+RFID DBL (SPONGE) ×3 IMPLANT
GAUZE SPONGE 4X4 12PLY STRL (GAUZE/BANDAGES/DRESSINGS) IMPLANT
GLOVE SURG ENC MOIS LTX SZ7.5 (GLOVE) ×3 IMPLANT
GOWN STRL REUS W/ TWL LRG LVL3 (GOWN DISPOSABLE) ×2 IMPLANT
GOWN STRL REUS W/ TWL XL LVL3 (GOWN DISPOSABLE) ×2 IMPLANT
GOWN STRL REUS W/TWL LRG LVL3 (GOWN DISPOSABLE) ×2
GOWN STRL REUS W/TWL XL LVL3 (GOWN DISPOSABLE) ×2
KIT HANDSET INTERSTIM COMM (NEUROSURGERY SUPPLIES) ×1 IMPLANT
KIT TURNOVER CYSTO (KITS) ×3 IMPLANT
LEAD INTERSTIM 4.32 28 L (Lead) ×1 IMPLANT
MANIFOLD NEPTUNE II (INSTRUMENTS) ×3 IMPLANT
NEEDLE HYPO 22GX1.5 SAFETY (NEEDLE) ×3 IMPLANT
NEUROSTIMULATOR 1.7X2X.06 (UROLOGICAL SUPPLIES) ×1 IMPLANT
NS IRRIG 500ML POUR BTL (IV SOLUTION) ×3 IMPLANT
PACK BASIN MINOR ARMC (MISCELLANEOUS) ×3 IMPLANT
PAD DRESSING TELFA 3X8 NADH (GAUZE/BANDAGES/DRESSINGS) ×2 IMPLANT
PENCIL ELECTRO HAND CTR (MISCELLANEOUS) IMPLANT
STAPLER VISISTAT 35W (STAPLE) IMPLANT
STIMULATOR INTERSTIM 2X1.7X.3 (Miscellaneous) ×3 IMPLANT
STRIP CLOSURE SKIN 1/2X4 (GAUZE/BANDAGES/DRESSINGS) ×3 IMPLANT
SUCTION FRAZIER HANDLE 10FR (MISCELLANEOUS) ×2
SUCTION TUBE FRAZIER 10FR DISP (MISCELLANEOUS) ×2 IMPLANT
SUT SILK 2 0 (SUTURE)
SUT SILK 2-0 18XBRD TIE 12 (SUTURE) IMPLANT
SUT VIC AB 3-0 SH 27 (SUTURE) ×2
SUT VIC AB 3-0 SH 27X BRD (SUTURE) ×4 IMPLANT
SUT VIC AB 4-0 PS2 18 (SUTURE) ×4 IMPLANT
SUT VIC AB 4-0 PS2 27 (SUTURE) ×2 IMPLANT
SUT VIC AB 4-0 RB1 27 (SUTURE)
SUT VIC AB 4-0 RB1 27X BRD (SUTURE) ×6 IMPLANT
SYR 10ML LL (SYRINGE) ×6 IMPLANT
SYR BULB IRRIG 60ML STRL (SYRINGE) ×3 IMPLANT
SYR CONTROL 10ML LL (SYRINGE) IMPLANT
TOWEL OR 17X26 4PK STRL BLUE (TOWEL DISPOSABLE) ×6 IMPLANT
TUBING CONNECTING 10 (TUBING) IMPLANT
WATER STERILE IRR 1000ML POUR (IV SOLUTION) ×3 IMPLANT
WATER STERILE IRR 500ML POUR (IV SOLUTION) ×3 IMPLANT

## 2021-09-18 NOTE — Interval H&P Note (Signed)
History and Physical Interval Note: ? ?09/18/2021 ?11:40 AM ? ?Denise Sharp  has presented today for surgery, with the diagnosis of Malfunctioning Interstim,.  The various methods of treatment have been discussed with the patient and family. After consideration of risks, benefits and other options for treatment, the patient has consented to  Procedure(s): ?REMOVAL OF INTERSTIM IMPLANT (N/A) ?INTERSTIM IMPLANT FIRST STAGE (N/A) ?INTERSTIM IMPLANT SECOND STAGE WITH IMPEDENCE CHECK (N/A) as a surgical intervention.  The patient's history has been reviewed, patient examined, no change in status, stable for surgery.  I have reviewed the patient's chart and labs.  Questions were answered to the patient's satisfaction.   ? ? ?Miraya Cudney A Ceceilia Cephus ? ? ?

## 2021-09-18 NOTE — Op Note (Signed)
Preoperative diagnosis: Refractory urgency incontinence and malfunctioning InterStim ?Postoperative diagnosis: Refractory urgency incontinence and malfunctioning InterStim ?Surgery: Removal of InterStim; stage I and stage II implantation of InterStim and impedance check ?Surgeon: Dr. Nicki Reaper Javaris Wigington ? ?The patient has the above diagnosis and consented the above procedure.  Preoperative antibiotics were given.  Extra care was taken with positioning in the prone position.  Fluoroscopy allowed me to mark the left buttock incision in the right midline incision ? ?I instilled 10 cc of lidocaine epinephrine mixture in the left buttock incision.  I dissected with scalpel through the skin and subcutaneous tissue and then used cutting current to open the pseudocapsule to deliver the IPG.  Hemostat was placed across the lead and the lead was cut. ? ?By pulling on the lead I could see the skin dimple minimally and with the help of fluoroscopy I made a right buttock incision just to the right of the midline.  She turned out to be very deep so I had to extend it in total approximately 3 cm.  The lead was exceptionally deep but I could feel it and it was easily delivered up out of the incision and from the lateral to medial. ? ?I did some sharp and finger dissection towards the bony table.  Under fluoroscopic guidance using my hemostat with my usual technique I delivered the total lead.  Tip was delivered as well.  There was minimal subcutaneous bleeding ? ?Over the next 45 to 60 minutes I tried multiple times to get good motor responses in the S3 foramina.  We had kept copies of where the lead was placed from her previous surgery as a basis.  In my opinion I was in S3 but I could not get bellows or toe response.  I tried both sides multiple times.  I tried multiple angles.  I finally negotiated the S3 on the right and based upon all fluoroscopic and bony landmarks I felt I was in excellent position.  There was always a question  whether or not she was getting some toe response. ? ?In order to get the above position I actually passed the foramen needle through the skin medial to the 3 cm incision to the right of the midline.  I then passed the guide to appropriate depth and made a 1 cm skin incision.  I passed the white trocar to appropriate depth.  I passed a well-prepared lead to the appropriate depth and it curled very nicely.  She may have had toe responses at all 4 setting but once again it was questionable.  The position of the lead was double checked AP and lateral and it looked beautiful relative to the initial lead and it even looked better.  I disengaged the white trocar leaving the lead in good position and took AP and lateral x-rays and hard copies ? ?With a tunneler I passed the lead at appropriate depth from medial to lateral.  It was fashioned to the IPG with screwdriver with the described technique.  It laid in the pseudocapsule nicely tension-free. ? ?Impedance check was done and was normal in all 4-lead positions.  I saw what I felt was a Bellow response during this ? ?I closed the left buttock incision with running 3-0 Vicryl subcutaneous tissue followed by 4-0 Monocryl subcuticular.  I closed the 3 cm incision with 3-0 Vicryl subcutaneous and interrupted 4-0 Vicryl.  I closed the 1 cm incision with interrupted 4-0 Vicryl.  Sterile dressing was applied. ? ?The case was  very long but anesthesia was excellent.  I really felt that the lead could not be placed in a better position and hopefully she will get good responses. ? ? ?

## 2021-09-18 NOTE — Progress Notes (Signed)
Cv and resp exam normal ?Questions answered ?

## 2021-09-18 NOTE — Discharge Instructions (Addendum)
I have reviewed discharge instructions in detail with the patient. They will follow-up with me or their physician as scheduled. My nurse will also be calling the patients as per protocol.   ? ?Start ASA on Friday ?Remove dressings Saturday ? ?AMBULATORY SURGERY  ?DISCHARGE INSTRUCTIONS ? ? ?The drugs that you were given will stay in your system until tomorrow so for the next 24 hours you should not: ? ?Drive an automobile ?Make any legal decisions ?Drink any alcoholic beverage ? ? ?You may resume regular meals tomorrow.  Today it is better to start with liquids and gradually work up to solid foods. ? ?You may eat anything you prefer, but it is better to start with liquids, then soup and crackers, and gradually work up to solid foods. ? ? ?Please notify your doctor immediately if you have any unusual bleeding, trouble breathing, redness and pain at the surgery site, drainage, fever, or pain not relieved by medication. ? ? ? ?Additional Instructions: ? ? ?Please contact your physician with any problems or Same Day Surgery at [phone], Monday through Friday 6 am to 4 pm, or [place] at [facility] number at [phone].  ?

## 2021-09-18 NOTE — Anesthesia Preprocedure Evaluation (Addendum)
Anesthesia Evaluation  ?Patient identified by MRN, date of birth, ID band ?Patient awake ? ? ? ?History of Anesthesia Complications ?Negative for: history of anesthetic complications ? ?Airway ?Mallampati: II ? ? ? ? ? ? Dental ? ?(+) Upper Dentures, Lower Dentures, Dental Advidsory Given ?  ?Pulmonary ?shortness of breath and with exertion, asthma , sleep apnea and Continuous Positive Airway Pressure Ventilation , neg recent URI,  ?  ? ?+ decreased breath sounds ? ? ? ? ? Cardiovascular ?Exercise Tolerance: Poor ?hypertension, Pt. on medications ?+ angina with exertion + CAD and + DOE  ?(-) Past MI and (-) Cardiac Stents + dysrhythmias (-) Valvular Problems/Murmurs ?Rhythm:Irregular  ? ?  ?Neuro/Psych ? Headaches, PSYCHIATRIC DISORDERS Anxiety Depression  Neuromuscular disease   ? GI/Hepatic ?Neg liver ROS, GERD  ,  ?Endo/Other  ?diabetes, Poorly Controlled, Type 1Morbid obesity ? Renal/GU ?CRFRenal disease  ?negative genitourinary ?  ?Musculoskeletal ? ? Abdominal ?(+) + obese,   ?Peds ?negative pediatric ROS ?(+)  Hematology ? ?(+) Blood dyscrasia, anemia ,   ?Anesthesia Other Findings ?Past Medical History: ?No date: (HFpEF) heart failure with preserved ejection fraction ([place]) ?    Comment:  a. 12/2019 Echo: EF 65-70%, Gr2 DD. No significant  ?             valvular dzs. ?03/25/2013: Abnormality of gait ?No date: Adrenal mass, left ([place]) ?No date: Anginal pain ([place]) ?No date: Anxiety ?No date: Aortic atherosclerosis ([place]) ?No date: Arthritis ?No date: Asthma ?No date: CAD (coronary artery disease) with h/o Atypical Chest Pain ?    Comment:  a. 04/1986 Cath ([facility]): nl cors, EF 65%; b. 07/2012 Cath  ?             [facility]): Diff minor irregs; c. 07/2016 MV [facility]):  ?             "Equivocal"; d. 08/2016 Cardiac CT Ca2+ score [facility]): Ca2+ ?             7035; e. 05/2019 MV: No ischemia. EF 75%; f. 12/2019 PCI:  ?             LM nl, LAD 65ost (3.5x12 Resolute DES), 88m(2.75x12  ?              Resolute DES),LCX/RCA nl; g. 08/2020 Cath: patent LAD  ?             stents, RCA 40ost, elev RH pressures, EF >65%. ?1994: Cervical spinal stenosis ?    Comment:  due to trauma to back ([employer] accident), has  ?             intermittent paralysis and parasthesias ?03/23/2014: Cervicogenic headache ?No date: CKD (chronic kidney disease), stage III ([place] ?No date: Depression ?No date: Diverticulosis ?No date: Dizziness ?    Comment:  a.) chronic ?No date: DJD (degenerative joint disease) ?    Comment:  a. Chronic R shoulder pain ?No date: Dyspnea ?09/2009: Esophageal stenosis ?    Comment:  a.) transient outlet obstruction by food, cleared by EGD ?No date: Family history of adverse reaction to anesthesia ?    Comment:  a.) daughter with (+) PONV ?No date: Gastric bypass status for obesity ?No date: GERD (gastroesophageal reflux disease) ?No date: Headache(784.0) ?No date: HLD (hyperlipidemia) ?No date: Hypertension ?No date: IBS (irritable bowel syndrome) ?No date: IDA (iron deficiency anemia) ?    Comment:  a.) post 2 unit txfsn 2009, normal endo/colonoscopy by  ?  [facility] ?No date: Left bundle branch block (LBBB) ?    Comment:  a.) Intermittently present - likely rate related. ?11/15/2020: NSVT (nonsustained ventricular tachycardia) ?    Comment:  a.) Holter study 11/15/2020; 11 episodes of NSVT lasting ?             up to 8 beats with a maximum rate of 210 bpm; 14 atrial  ?             runs lasting up to 18 beats with a rate of up to 250 bpm. ?             Some atrial runs felt to be NSVT. ?No date: Obesity ?No date: OSA on CPAP ?03/25/2013: Polyneuropathy in diabetes(357.2) ?03/09/2021: Postherpetic neuralgia ?    Comment:  Right V1 distribution ?No date: Restless leg syndrome ?08/13/2017: Rotator cuff arthropathy, right ?03/12/2014: Syncope and collapse ?No date: Type II diabetes mellitus ([place]) ? ? Reproductive/Obstetrics ?negative OB ROS ? ?  ? ? ? ? ? ? ? ? ? ? ? ? ? ?  ?  ? ? ? ? ? ? ? ? ?Anesthesia  Physical ? ?Anesthesia Plan ? ?ASA: 4 ? ?Anesthesia Plan: General  ? ?Post-op Pain Management: Minimal or no pain anticipated  ? ?Induction: Intravenous ? ?PONV Risk Score and Plan: 3 and Propofol infusion and TIVA ? ?Airway Management Planned: Nasal Cannula and Natural Airway ? ?Additional Equipment:  ? ?Intra-op Plan:  ? ?Post-operative Plan:  ? ?Informed Consent: I have reviewed the patients History and Physical, chart, labs and discussed the procedure including the risks, benefits and alternatives for the proposed anesthesia with the patient or authorized representative who has indicated his/her understanding and acceptance.  ? ? ? ? ? ?Plan Discussed with: CRNA and Surgeon ? ?Anesthesia Plan Comments:   ? ? ? ? ? ?Anesthesia Quick Evaluation ? ?

## 2021-09-18 NOTE — Transfer of Care (Signed)
Immediate Anesthesia Transfer of Care Note ? ?Patient: Denise Sharp ? ?Procedure(s) Performed: REMOVAL OF INTERSTIM IMPLANT (Back) ?INTERSTIM IMPLANT FIRST STAGE (Back) ?INTERSTIM IMPLANT SECOND STAGE WITH IMPEDENCE CHECK (Back) ? ?Patient Location: PACU ? ?Anesthesia Type:General ? ?Level of Consciousness: awake, alert  and oriented ? ?Airway & Oxygen Therapy: Patient Spontanous Breathing ? ?Post-op Assessment: Report given to RN and Post -op Vital signs reviewed and stable ? ?Post vital signs: Reviewed and stable ? ?Last Vitals:  ?Vitals Value Taken Time  ?BP 118/56   ?Temp    ?Pulse 79 09/18/21 1511  ?Resp 14 09/18/21 1511  ?SpO2 99 % 09/18/21 1511  ?Vitals shown include unvalidated device data. ? ?Last Pain:  ?Vitals:  ? 09/18/21 1145  ?TempSrc: Temporal  ?PainSc: 0-No pain  ?   ? ?  ? ?Complications: No notable events documented. ?

## 2021-09-19 ENCOUNTER — Telehealth: Payer: Self-pay

## 2021-09-19 ENCOUNTER — Telehealth: Payer: Self-pay | Admitting: Internal Medicine

## 2021-09-19 ENCOUNTER — Encounter: Payer: Self-pay | Admitting: Urology

## 2021-09-19 ENCOUNTER — Ambulatory Visit (INDEPENDENT_AMBULATORY_CARE_PROVIDER_SITE_OTHER): Payer: Medicare Other | Admitting: Internal Medicine

## 2021-09-19 DIAGNOSIS — G4733 Obstructive sleep apnea (adult) (pediatric): Secondary | ICD-10-CM

## 2021-09-19 DIAGNOSIS — I200 Unstable angina: Secondary | ICD-10-CM | POA: Diagnosis not present

## 2021-09-19 DIAGNOSIS — Z9989 Dependence on other enabling machines and devices: Secondary | ICD-10-CM | POA: Diagnosis not present

## 2021-09-19 NOTE — Telephone Encounter (Signed)
Called pt  ?I was able to see her DL on Air[facility] and so I advised no need to bring CPAP with her today  ?Nothing further needed ?

## 2021-09-19 NOTE — Patient Instructions (Signed)
We can continue CPAP auto 4-15, mask of choice, humidifier, supplies, AirView/ card ? ?We will transfer your Pulmonary/ Sleep medicine care to [facility] for your travel convenience. ? ? ?

## 2021-09-19 NOTE — Anesthesia Postprocedure Evaluation (Signed)
Anesthesia Post Note ? ?Patient: Denise Sharp ? ?Procedure(s) Performed: REMOVAL OF INTERSTIM IMPLANT (Back) ?INTERSTIM IMPLANT FIRST STAGE (Back) ?INTERSTIM IMPLANT SECOND STAGE WITH IMPEDENCE CHECK (Back) ? ?Patient location during evaluation: PACU ?Anesthesia Type: General ?Level of consciousness: awake and alert ?Pain management: pain level controlled ?Vital Signs Assessment: post-procedure vital signs reviewed and stable ?Respiratory status: spontaneous breathing, nonlabored ventilation, respiratory function stable and patient connected to nasal cannula oxygen ?Cardiovascular status: blood pressure returned to baseline and stable ?Postop Assessment: no apparent nausea or vomiting ?Anesthetic complications: no ? ? ?No notable events documented. ? ? ?Last Vitals:  ?Vitals:  ? 09/18/21 1545 09/18/21 1600  ?BP: (!) 97/39 (!) 105/42  ?Pulse: 69 71  ?Resp: 14 15  ?Temp:  (!) 36.3 ?C  ?SpO2: 99% 100%  ?  ?Last Pain:  ?Vitals:  ? 09/19/21 0839  ?TempSrc:   ?PainSc: 0-No pain  ? ? ?  ?  ?  ?  ?  ?  ? ?Martha Clan ? ? ? ? ?

## 2021-09-19 NOTE — Telephone Encounter (Signed)
Pt called stating she is in a lot of pain. Patient states the pain meds that were called in are out of stock at pharmacy and they are unsure when they will receive medication. ?

## 2021-09-20 NOTE — Telephone Encounter (Signed)
Pt returned call and I read message from [place]. ?

## 2021-09-20 NOTE — Telephone Encounter (Signed)
Left message for pt to return my call.

## 2021-09-21 ENCOUNTER — Telehealth: Payer: Self-pay | Admitting: Internal Medicine

## 2021-09-21 NOTE — Telephone Encounter (Signed)
Pt daughter Ms. Brigitte Pulse called in stating that Pt husband went to doctor about and they received bad news. Pt daughter stated that pt husband has a short time living. Pt daughter stated that pt has so much anxiety that pt is afraid that husband my pass away next to her in bed. Pt daughter stated that pt is wondering if its ok to increase her anxiety medication dosage for a few days. Pt requesting callback.  ?

## 2021-09-21 NOTE — Telephone Encounter (Signed)
Pt would like for [place] to call her back in the morning on her daughter's phone.  ? ?Barney Drain # [phone] ?

## 2021-09-21 NOTE — Telephone Encounter (Signed)
Spoke with pt to see about scheduling her an appt to discuss increasing her Zoloft. Pt stated that she is unable to make an appointment at this time because of the health conditions that her husband is having and not sure what they are going to be doing with him next. Pt was wanting to know if she could just increase her Zoloft to help her get through this time. Pt is currently taking 25 mg daily.  ?

## 2021-09-22 NOTE — Telephone Encounter (Signed)
Pt is aware that she can increase zoloft to 50 mg.  ?

## 2021-09-29 DIAGNOSIS — Z20822 Contact with and (suspected) exposure to covid-19: Secondary | ICD-10-CM | POA: Diagnosis not present

## 2021-10-02 ENCOUNTER — Ambulatory Visit (INDEPENDENT_AMBULATORY_CARE_PROVIDER_SITE_OTHER): Payer: Medicare Other | Admitting: Urology

## 2021-10-02 ENCOUNTER — Other Ambulatory Visit: Payer: Self-pay

## 2021-10-02 VITALS — BP 124/74 | HR 65 | Wt 239.0 lb

## 2021-10-02 DIAGNOSIS — N3946 Mixed incontinence: Secondary | ICD-10-CM

## 2021-10-02 NOTE — Progress Notes (Signed)
? ?10/02/2021 ?10:11 AM  ? ?Denise Sharp ?11-29-1945 ?[id number] ? ?Referring provider: [name], MD ?[address] ?[address] ?[place],  [place] [address] ? ?Chief Complaint  ?Patient presents with  ? Follow-up  ? ? ?HPI: ?I placed an InterStim device September 18, 2021.  I had removed an old device.  The case took a long time and actually tried both sides to get finally good responses.  The lead was placed in the same position but in my opinion improved versus the initial InterStim lead. ? ?The patient had presented with high-volume urge incontinence wearing 6-8 pads a day with flooding foot on the floor syndrome in the middle of the night.  She wanted an MRI compatible device as well.  She had been dry on the InterStim years back. ? ?Little to no pain. ?Incisions look great.  She is completely continent and very happy ? ? ?PMH: ?Past Medical History:  ?Diagnosis Date  ? (HFpEF) heart failure with preserved ejection fraction ([place])   ? a. 12/2019 Echo: EF 65-70%, Gr2 DD. No significant valvular dzs.  ? Abnormality of gait 03/25/2013  ? Adrenal mass, left ([place])   ? Anginal pain ([place])   ? Anxiety   ? Aortic atherosclerosis ([place])   ? Arthritis   ? Asthma   ? CAD (coronary artery disease) with h/o Atypical Chest Pain   ? a. 04/1986 Cath ([facility]): nl cors, EF 65%; b. 07/2012 Cath [facility]): Diff minor irregs; c. 07/2016 MV [facility]): "Equivocal"; d. 08/2016 Cardiac CT Ca2+ score [facility]): Ca2+ 2094; e. 05/2019 MV: No ischemia. EF 75%; f. 12/2019 PCI: LM nl, LAD 65ost (3.5x12 Resolute DES), 4m(2.75x12 Resolute DES),LCX/RCA nl; g. 08/2020 Cath: patent LAD stents, RCA 40ost, elev RH pressures, EF >65%.  ? Cervical spinal stenosis 1994  ? due to trauma to back ([employer] accident), has intermittent paralysis and parasthesias  ? Cervicogenic headache 03/23/2014  ? CKD (chronic kidney disease), stage III ([place]   ? Depression   ? Diverticulosis   ? Dizziness   ? a.) chronic  ? DJD (degenerative joint disease)   ? a. Chronic R shoulder  pain  ? Dyspnea   ? Esophageal stenosis 09/2009  ? a.) transient outlet obstruction by food, cleared by EGD  ? Family history of adverse reaction to anesthesia   ? a.) daughter with (+) PONV  ? Gastric bypass status for obesity   ? GERD (gastroesophageal reflux disease)   ? Headache(784.0)   ? HLD (hyperlipidemia)   ? Hypertension   ? IBS (irritable bowel syndrome)   ? IDA (iron deficiency anemia)   ? a.) post 2 unit txfsn 2009[facility] ? Left bundle branch block (LBBB)   ? a.) Intermittently present - likely rate related.  ? NSVT (nonsustained ventricular tachycardia) 11/15/2020  ? a.) Holter study 11/15/2020; 11 episodes of NSVT lasting up to 8 beats with a maximum rate of 210 bpm; 14 atrial runs lasting up to 18 beats with a rate of up to 250 bpm.  Some atrial runs felt to be NSVT.  ? Obesity   ? OSA on CPAP   ? Polyneuropathy in diabetes(357.2) 03/25/2013  ? 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 Right V1 distribution  ? Restless leg syndrome   ? Rotator cuff arthropathy, right 08/13/2017  ? Syncope and collapse 03/12/2014  ? Type II diabetes mellitus ([place]   ? ? ?Surgical History: ?Past Surgical History:  ?Procedure Laterality Date  ? APPENDECTOMY    ?  BACK SURGERY    ? CHOLECYSTECTOMY N/A 1976  ? COLONOSCOPY    ? CORONARY ANGIOPLASTY    ? CORONARY STENT INTERVENTION N/A 01/04/2020  ? Procedure: CORONARY STENT INTERVENTION;  Surgeon: [name], MD;  Location: [place] [facility];  Service: Cardiovascular;  Laterality: N/A;  LAD   ? DIAPHRAGMATIC HERNIA REPAIR  2015  ? ESOPHAGEAL DILATION    ? multiple  ? ESOPHAGOGASTRODUODENOSCOPY (EGD) WITH PROPOFOL N/A 05/11/2021  ? Procedure: ESOPHAGOGASTRODUODENOSCOPY (EGD) WITH PROPOFOL;  Surgeon: [name], MD;  Location: [facility] ENDOSCOPY;  Service: Endoscopy;  Laterality: N/A;  ? ESOPHAGOGASTRODUODENOSCOPY (EGD) WITH PROPOFOL N/A 06/13/2021  ? Procedure: ESOPHAGOGASTRODUODENOSCOPY (EGD) WITH PROPOFOL;  Surgeon: [name], MD;   Location: [facility] ENDOSCOPY;  Service: Endoscopy;  Laterality: N/A;  ? EYE SURGERY    ? GASTRIC BYPASS  2000, 2005  ? Dr. Debroah Loop  ? INTERSTIM IMPLANT PLACEMENT  10/2011  ? [place]  ? INTERSTIM IMPLANT PLACEMENT N/A 09/18/2021  ? Procedure: INTERSTIM IMPLANT FIRST STAGE;  Surgeon: Bjorn Loser, MD;  Location: [facility] ORS;  Service: Urology;  Laterality: N/A;  ? INTERSTIM IMPLANT PLACEMENT N/A 09/18/2021  ? Procedure: INTERSTIM IMPLANT SECOND STAGE WITH IMPEDENCE CHECK;  Surgeon: Bjorn Loser, MD;  Location: [facility] ORS;  Service: Urology;  Laterality: N/A;  ? INTERSTIM IMPLANT REMOVAL N/A 09/18/2021  ? Procedure: REMOVAL OF INTERSTIM IMPLANT;  Surgeon: Bjorn Loser, MD;  Location: [facility] OR[facility];  Service: Urology;  Laterality: N/A;  ? LEFT HEART CATH AND CORONARY ANGIOGRAPHY N/A 04/1986  ? LEFT HEART CATH AND CORONARY ANGIOGRAPHY Left 07/24/2012  ? LEFT HEART CATH AND CORS/GRAFTS ANGIOGRAPHY N/A 12/31/2019  ? Procedure: LEFT HEART CATH AND CORS/GRAFTS ANGIOGRAPHY;  Surgeon: [name], MD;  Location: [place] [facility];  Service: Cardiovascular;  Laterality: N/A;  ? PANNICULECTOMY N/A 06/16/2019  ? Procedure: PANNICULECTOMY;  Surgeon: [name], MD;  Location: [place];  Service: Plastics;  Laterality: N/A;  3 hours, please  ? REVERSE SHOULDER ARTHROPLASTY Right 08/13/2017  ? Procedure: REVERSE RIGHT SHOULDER ARTHROPLASTY;  Surgeon: [name], MD;  Location: [place];  Service: Orthopedics;  Laterality: Right;  ? RIGHT/LEFT HEART CATH AND CORONARY ANGIOGRAPHY N/A 09/06/2020  ? Procedure: RIGHT/LEFT HEART CATH AND CORONARY ANGIOGRAPHY;  Surgeon: [name], MD;  Location: [place] [facility];  Service: Cardiovascular;  Laterality: N/A;  ? ROTATOR CUFF REPAIR Right   ? [place]  ? [place]  ? TOTAL ABDOMINAL HYSTERECTOMY W/ BILATERAL SALPINGOOPHORECTOMY  1974  ? TOTAL KNEE ARTHROPLASTY Bilateral 2007  ? Procedure: TOTAL KNEE ARTHROPLASTY; Surgeons: Mauri Pole, MD and [name], MD  ? UMBILICAL HERNIA  REPAIR  08/11/2015  ? ? ?Home Medications:  ?Allergies as of 10/02/2021   ? ?   Reactions  ? Demeclocycline Hives  ? Erythromycin Nausea And Vomiting, Other (See Comments)  ? Severe irritable bowel  ? Flagyl [metronidazole] Nausea And Vomiting, Other (See Comments)  ? Severe irritable bowel  ? Glucophage [metformin Hcl] Nausea And Vomiting, Other (See Comments)  ? "Sick" "I won't take anything that has metformin in it"  ? Tetracyclines & Related Hives, Rash  ? Diovan [valsartan] Nausea Only  ?    ? Sulfa Antibiotics Rash, Other (See Comments)  ? As child  ? Xanax [alprazolam] Other (See Comments)  ? Unknown reaction  ? ?  ? ?  ?Medication List  ?  ? ?  ? Accurate as of October 02, 2021 10:11 AM. If you have any questions, ask your nurse or doctor.  ?  ?  ? ?  ? ?  STOP taking these medications   ? ?cephALEXin 500 MG capsule ?Commonly known as: Keflex ?Stopped by: Reece Packer, MD ?  ? ?  ? ?TAKE these medications   ? ?atorvastatin 80 MG tablet ?Commonly known as: LIPITOR ?Take 1 tablet (80 mg total) by mouth daily. ?  ?azithromycin 250 MG tablet ?Commonly known as: ZITHROMAX ?TAKE 2 TABLETS BY MOUTH TODAY, THEN TAKE 1 TABLET DAILY FOR 4 DAYS ?  ?CENTRUM SILVER 50+WOMEN PO ?Take 1 tablet by mouth daily. ?  ?clotrimazole-betamethasone cream ?Commonly known as: LOTRISONE ?Apply 1 application topically 2 (two) times daily as needed (irritation). ?  ?cyanocobalamin 1000 MCG/ML injection ?Commonly known as: (VITAMIN B-12) ?Inject 1 mL (1,000 mcg total) into the muscle every 30 (thirty) days. Inject 1 ml (1000 mcg ) IM weekly x 4,  Then monthly thereafter ?  ?dapagliflozin propanediol 10 MG Tabs tablet ?Commonly known as: FARXIGA ?Take 1 tablet (10 mg total) by mouth daily. ?  ?dicyclomine 20 MG tablet ?Commonly known as: BENTYL ?Take 1 tablet (20 mg total) by mouth 3 (three) times daily before meals. ?  ?diphenoxylate-atropine 2.5-0.025 MG tablet ?Commonly known as: Lomotil ?Take 1 tablet by mouth 4 (four) times daily as  needed for diarrhea or loose stools. ?  ?Droplet Pen Needles 31G X 5 MM Misc ?Generic drug: Insulin Pen Needle ?USE ONE NEEDLE SUBCUTANEOUSLY AS DIRECTED (REMOVE AND DISCARD NEEDLE IN SHARPS CONTAINER IMMEDIATEL

## 2021-10-12 DIAGNOSIS — Z20822 Contact with and (suspected) exposure to covid-19: Secondary | ICD-10-CM | POA: Diagnosis not present

## 2021-10-13 DIAGNOSIS — E1140 Type 2 diabetes mellitus with diabetic neuropathy, unspecified: Secondary | ICD-10-CM

## 2021-10-13 DIAGNOSIS — E1122 Type 2 diabetes mellitus with diabetic chronic kidney disease: Secondary | ICD-10-CM

## 2021-10-13 DIAGNOSIS — N183 Chronic kidney disease, stage 3 unspecified: Secondary | ICD-10-CM | POA: Diagnosis not present

## 2021-10-13 DIAGNOSIS — I2510 Atherosclerotic heart disease of native coronary artery without angina pectoris: Secondary | ICD-10-CM | POA: Diagnosis not present

## 2021-10-14 ENCOUNTER — Encounter: Payer: Self-pay | Admitting: Internal Medicine

## 2021-10-14 NOTE — Assessment & Plan Note (Signed)
Limited mobility makes effective weight loss more difficult. ?Consider referral to organized support program. ?

## 2021-10-14 NOTE — Assessment & Plan Note (Signed)
Benefits from CPAP with good compliance and control ?Plan- continue autopaap 4-15 ?

## 2021-10-19 DIAGNOSIS — D508 Other iron deficiency anemias: Secondary | ICD-10-CM | POA: Diagnosis not present

## 2021-10-19 DIAGNOSIS — E611 Iron deficiency: Secondary | ICD-10-CM | POA: Diagnosis not present

## 2021-10-19 DIAGNOSIS — I509 Heart failure, unspecified: Secondary | ICD-10-CM | POA: Diagnosis not present

## 2021-10-19 DIAGNOSIS — R609 Edema, unspecified: Secondary | ICD-10-CM | POA: Diagnosis not present

## 2021-10-19 DIAGNOSIS — E1129 Type 2 diabetes mellitus with other diabetic kidney complication: Secondary | ICD-10-CM | POA: Diagnosis not present

## 2021-10-19 DIAGNOSIS — N1832 Chronic kidney disease, stage 3b: Secondary | ICD-10-CM | POA: Diagnosis not present

## 2021-10-19 DIAGNOSIS — Z9884 Bariatric surgery status: Secondary | ICD-10-CM | POA: Diagnosis not present

## 2021-10-19 DIAGNOSIS — D649 Anemia, unspecified: Secondary | ICD-10-CM | POA: Insufficient documentation

## 2021-10-19 DIAGNOSIS — E7800 Pure hypercholesterolemia, unspecified: Secondary | ICD-10-CM | POA: Diagnosis not present

## 2021-10-19 DIAGNOSIS — R3914 Feeling of incomplete bladder emptying: Secondary | ICD-10-CM | POA: Diagnosis not present

## 2021-10-19 DIAGNOSIS — E1122 Type 2 diabetes mellitus with diabetic chronic kidney disease: Secondary | ICD-10-CM | POA: Diagnosis not present

## 2021-10-19 DIAGNOSIS — R809 Proteinuria, unspecified: Secondary | ICD-10-CM | POA: Diagnosis not present

## 2021-10-19 DIAGNOSIS — G4733 Obstructive sleep apnea (adult) (pediatric): Secondary | ICD-10-CM | POA: Diagnosis not present

## 2021-10-23 ENCOUNTER — Ambulatory Visit
Admission: RE | Admit: 2021-10-23 | Discharge: 2021-10-23 | Disposition: A | Discharge status: Home / Self Care | Payer: Medicare Other | Source: Ambulatory Visit | Attending: Urgent Care | Admitting: Urgent Care

## 2021-10-23 ENCOUNTER — Ambulatory Visit (INDEPENDENT_AMBULATORY_CARE_PROVIDER_SITE_OTHER): Payer: Medicare Other

## 2021-10-23 VITALS — BP 106/63 | HR 79 | Temp 97.7°F | Resp 16

## 2021-10-23 DIAGNOSIS — J189 Pneumonia, unspecified organism: Secondary | ICD-10-CM

## 2021-10-23 DIAGNOSIS — R059 Cough, unspecified: Secondary | ICD-10-CM | POA: Diagnosis not present

## 2021-10-23 MED ORDER — MOXIFLOXACIN HCL 400 MG PO TABS
400.0000 mg | ORAL_TABLET | Freq: Every day | ORAL | 0 refills | Status: AC
Start: 2021-10-23 — End: 2021-10-30

## 2021-10-23 NOTE — ED Triage Notes (Signed)
Patient presents to Urgent Care with complaints of cough, SOB, right ear fullness, nasal congestion x 3 weeks. Treating symptoms with mucinex, inhaler, and allergy meds no improvement.  ? ?Denies fever.   ?

## 2021-10-23 NOTE — Discharge Instructions (Addendum)
Your chest x-ray is suggestive of a left lower lobe pneumonia. ?Typically we treat these on an outpatient basis with Augmentin and doxycycline, however you have allergies to these treatments. ?Therefore we will try moxifloxacin instead.  Please take 1 tablet daily for 7 days.  If severe ankle pain occurs, please stop the medication and contact our office. ?Take a probiotic or yogurt to help prevent GI side effects. ?Please follow-up with your primary care physician and have them repeat a chest x-ray in 6 to 10 weeks. ?If any worsening shortness of breath, chest pain please had to the emergency room. ?

## 2021-10-23 NOTE — ED Provider Notes (Signed)
Your chest x-ray shows ?[facility] ? ? ? ?CSN: [id number] ?Arrival date & time: 10/23/21  1558 ? ? ?  ? ?History   ?Chief Complaint ?Chief Complaint  ?Patient presents with  ? Cough  ?  And congestion - Entered by patient  ? ? ?HPI ?Denise Sharp is a 76 y.o. female.  ? 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 She states she has had a cough, congestion and tightness in her chest for the past 3 weeks. She reports a substernal pressure. She does have a cardiac history and states she even took a nitro tablet to see if it was her heart. She reported no improvement with the nitro. She has been taking numerous OTC medications including mucinex, antihistamines, cough suppressants and inhalers without relief. She denies a fever. She is not a smoker. She does have OSA and uses CPAP nightly. She states she has even been using her CPAP during the day recently "almost like supplemental oxygen." She reports her husband is having a very intense cardiac surgery next Monday and is hoping to workup and treat "whatever this is very aggressively" as she will be his care taker. ? ? ?Cough ?Associated symptoms: shortness of breath   ? ?Past Medical History:  ?Diagnosis Date  ? (HFpEF) heart failure with preserved ejection fraction ([place])   ? a. 12/2019 Echo: EF 65-70%, Gr2 DD. No significant valvular dzs.  ? Abnormality of gait 03/25/2013  ? Adrenal mass, left ([place])   ? Anginal pain ([place])   ? Anxiety   ? Aortic atherosclerosis ([place])   ? Arthritis   ? Asthma   ? CAD (coronary artery disease) with h/o Atypical Chest Pain   ? a. 04/1986 Cath ([facility]): nl cors, EF 65%; b. 07/2012 Cath [facility]): Diff minor irregs; c. 07/2016 MV [facility]): "Equivocal"; d. 08/2016 Cardiac CT Ca2+ score [facility]): Ca2+ 2947; e. 05/2019 MV: No ischemia. EF 75%; f. 12/2019 PCI: LM nl, LAD 65ost (3.5x12 Resolute DES), 7m(2.75x12 Resolute DES),LCX/RCA nl; g. 08/2020 Cath: patent LAD stents, RCA  40ost, elev RH pressures, EF >65%.  ? Cervical spinal stenosis 1994  ? due to trauma to back ([employer] accident), has intermittent paralysis and parasthesias  ? Cervicogenic headache 03/23/2014  ? CKD (chronic kidney disease), stage III ([place]   ? Depression   ? Diverticulosis   ? Dizziness   ? a.) chronic  ? DJD (degenerative joint disease)   ? a. Chronic R shoulder pain  ? Dyspnea   ? Esophageal stenosis 09/2009  ? a.) transient outlet obstruction by food, cleared by EGD  ? Family history of adverse reaction to anesthesia   ? a.) daughter with (+) PONV  ? Gastric bypass status for obesity   ? GERD (gastroesophageal reflux disease)   ? Headache(784.0)   ? HLD (hyperlipidemia)   ? Hypertension   ? IBS (irritable bowel syndrome)   ? IDA (iron deficiency anemia)   ? a.) post 2 unit txfsn 2009, [facility] ? Left bundle branch block (LBBB)   ? a.) Intermittently present - likely rate related.  ? NSVT (nonsustained ventricular tachycardia) ([place] 11/15/2020  ? a.) Holter study 11/15/2020; 11 episodes of NSVT lasting up to 8 beats with a maximum rate of 210 bpm; 14 atrial runs lasting up to 18 beats with a rate of up to 250 bpm.  Some atrial runs felt to be NSVT.  ? Obesity   ? OSA  on CPAP   ? Polyneuropathy in diabetes(357.2) 03/25/2013  ? 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 Right V1 distribution  ? Restless leg syndrome   ? Rotator cuff arthropathy, right 08/13/2017  ? Syncope and collapse 03/12/2014  ? Type II diabetes mellitus ([place])   ? ? ?Patient Active Problem List  ? Diagnosis Date Noted  ? Chronic neck pain 09/12/2021  ? PAC (premature atrial contraction) 08/19/2021  ?     08/03/2021  ? Cellulitis and abscess of toe 08/03/2021  ? Stricture and stenosis of esophagus   ? 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 Diabetes mellitus ([place]) 02/16/2021  ? PSVT (paroxysmal supraventricular tachycardia) ([place]) 12/15/2020  ? CKD stage 3 due to type 2 diabetes mellitus ([place]) 12/09/2020  ? Other pulmonary embolism  with acute cor pulmonale, unspecified chronicity ([place]) 11/17/2020  ? Mild episode of recurrent major depressive disorder ([place]) 11/17/2020  ? Hyperlipidemia associated with type 2 diabetes mellitus ([place]) 08/21/2020  ? Iron deficiency 06/28/2020  ? Elevated alkaline phosphatase level 06/10/2020  ? Coagulation defect ([place]) 06/06/2020  ? Chronic heart failure with preserved ejection fraction (HFpEF) ([place]) 01/14/2020  ? Coronary artery disease involving native coronary artery of native heart without angina pectoris 01/13/2020  ? S/P cardiac catheterization 01/13/2020  ? Accelerating angina ([place]) 12/31/2019  ? Dysphagia 11/30/2019  ? Bladder pain 08/23/2019  ? Gastritis 08/07/2019  ? Lab test negative for COVID-19 virus 08/07/2019  ? S/P panniculectomy 06/16/2019  ? Major depressive disorder with current active episode 05/30/2019  ? Exposure to COVID-19 virus 12/25/2018  ? Functional diarrhea 10/22/2018  ? Bilateral cataracts 06/26/2018  ? Status post bariatric surgery 02/05/2018  ? GAD (generalized anxiety disorder) 02/01/2018  ? Rectocele 01/24/2018  ? Vaginal prolapse 01/04/2018  ? Hepatic steatosis 01/04/2018  ? Microalbuminuria due to type 2 diabetes mellitus ([place]) 11/21/2017  ? Hospital discharge follow-up 09/22/2017  ? Fecal incontinence 09/22/2017  ? B12 deficiency 09/22/2017  ? Rotator cuff arthropathy, right 08/13/2017  ? Primary localized osteoarthrosis of shoulder 08/13/2017  ? Encounter for preoperative examination for general surgical procedure 08/03/2017  ? 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 Candidiasis, intertrigo 08/03/2017  ? Venous stasis dermatitis of right lower extremity 11/21/2016  ? Hypersomnia, persistent 06/18/2015  ? Mechanical breakdown of implanted electronic neurostimulator of peripheral nerve ([place]) 10/16/2014  ? Obesity hypoventilation syndrome ([place]) 08/13/2014  ? Frequent falls 06/23/2014  ? Chronic cough 05/05/2014  ? Adenoma of left adrenal gland  03/24/2014  ? Cervicogenic headache 03/23/2014  ? Vitamin D deficiency 01/06/2014  ? Weight gain following gastric bypass surgery 12/03/2013  ? Morbid obesity ([place]) 08/25/2013  ? Weakness 08/04/2013  ? Diaphragmatic hernia 04/06/2013  ? Diabetic polyneuropathy ([place]) 03/25/2013  ? Abnormality of gait 03/25/2013  ? Multiple pulmonary nodules 03/20/2013  ? DOE (dyspnea on exertion) 03/20/2013  ? Iron deficiency anemia 08/06/2012  ? Functional disorder of bladder 07/15/2012  ? Urge incontinence 07/15/2012  ? Detrusor overactivity 07/15/2012  ? OSA on CPAP 03/02/2012  ? Insomnia secondary to anxiety 02/29/2012  ? Sciatica 09/05/2011  ? Cervical stenosis of spinal canal 06/14/2011  ? Restless legs syndrome 03/13/2011  ? Essential hypertension 03/12/2011  ? Type 2 diabetes mellitus with diabetic neuropathy, unspecified ([place]) 03/12/2011  ? Hearing loss, right 03/12/2011  ? ? ?Past Surgical History:  ?Procedure Laterality Date  ? APPENDECTOMY    ? BACK SURGERY    ? CHOLECYSTECTOMY N/A 1976  ? COLONOSCOPY    ? CORONARY ANGIOPLASTY    ?  CORONARY STENT INTERVENTION N/A 01/04/2020  ? Procedure: CORONARY STENT INTERVENTION;  Surgeon: [name], MD;  Location: [facility];  Service: Cardiovascular;  Laterality: N/A;  LAD   ? DIAPHRAGMATIC HERNIA REPAIR  2015  ? ESOPHAGEAL DILATION    ? multiple  ? ESOPHAGOGASTRODUODENOSCOPY (EGD) WITH PROPOFOL N/A 05/11/2021  ? Procedure: ESOPHAGOGASTRODUODENOSCOPY (EGD) WITH PROPOFOL;  Surgeon: [name], MD;  Location: [facility] ENDOSCOPY;  Service: Endoscopy;  Laterality: N/A;  ? ESOPHAGOGASTRODUODENOSCOPY (EGD) WITH PROPOFOL N/A 06/13/2021  ? Procedure: ESOPHAGOGASTRODUODENOSCOPY (EGD) WITH PROPOFOL;  Surgeon: [name], MD;  Location: [facility] ENDOSCOPY;  Service: Endoscopy;  Laterality: N/A;  ? EYE SURGERY    ? GASTRIC BYPASS  2000, 2005  ? Dr. Debroah Loop  ? INTERSTIM IMPLANT PLACEMENT  10/2011  ? [place]  ? INTERSTIM IMPLANT PLACEMENT N/A 09/18/2021  ? Procedure: INTERSTIM IMPLANT  FIRST STAGE;  Surgeon: Bjorn Loser, MD;  Location: [facility] ORS;  Service: Urology;  Laterality: N/A;  ? INTERSTIM IMPLANT PLACEMENT N/A 09/18/2021  ? Procedure: INTERSTIM IMPLANT SECOND STAGE WITH IMPED

## 2021-10-30 DIAGNOSIS — Z20822 Contact with and (suspected) exposure to covid-19: Secondary | ICD-10-CM | POA: Diagnosis not present

## 2021-11-02 ENCOUNTER — Ambulatory Visit (INDEPENDENT_AMBULATORY_CARE_PROVIDER_SITE_OTHER): Payer: Medicare Other | Admitting: Podiatry

## 2021-11-02 ENCOUNTER — Encounter: Payer: Self-pay | Admitting: Podiatry

## 2021-11-02 ENCOUNTER — Telehealth: Payer: Self-pay | Admitting: Internal Medicine

## 2021-11-02 DIAGNOSIS — M2041 Other hammer toe(s) (acquired), right foot: Secondary | ICD-10-CM

## 2021-11-02 DIAGNOSIS — B351 Tinea unguium: Secondary | ICD-10-CM

## 2021-11-02 DIAGNOSIS — M79676 Pain in unspecified toe(s): Secondary | ICD-10-CM

## 2021-11-02 DIAGNOSIS — D689 Coagulation defect, unspecified: Secondary | ICD-10-CM

## 2021-11-02 DIAGNOSIS — E1149 Type 2 diabetes mellitus with other diabetic neurological complication: Secondary | ICD-10-CM

## 2021-11-02 NOTE — Telephone Encounter (Signed)
Patient scheduled with [facility] for 11/03/21, patient seen 7 days ago for DX pneumonia, not feeling any better but speaking in full sentences and able to yell she was not going to ED. ?

## 2021-11-02 NOTE — Progress Notes (Signed)
This patient returns to my office for at risk foot care.  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  Patient is taking plavix..  This patient is unable to cut nails herself since the patient cannot reach her nails.These nails are painful walking and wearing shoes.  This patient presents for at risk foot care today.  General Appearance  Alert, conversant and in no acute stress.  Vascular  Dorsalis pedis and posterior tibial  pulses are palpable  bilaterally.  Capillary return is within normal limits  bilaterally. Temperature is within normal limits  bilaterally.  Neurologic  Senn-Weinstein monofilament wire test absent   bilaterally. Muscle power within normal limits bilaterally.  Nails Thick disfigured discolored nails with subungual debris  from hallux to fifth toes bilaterally. No evidence of bacterial infection or drainage bilaterally.  Orthopedic  No limitations of motion  feet .  No crepitus or effusions noted.  No bony pathology or digital deformities noted. Charcot foot.  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  Skin  normotropic skin with no porokeratosis noted bilaterally.  No signs of infections or ulcers noted.  Laceration second toe right foot.  No bleeding or redness noted.   Onychomycosis  Pain in right toes  Pain in left toes  Consent was obtained for treatment procedures.   Mechanical debridement of nails 1-5  bilaterally performed with a nail nipper.  Filed with dremel without incident.    Return office visit    3 months                  Told patient to return for periodic foot care and evaluation due to potential at risk complications.   Jamarion [place] DPM  

## 2021-11-02 NOTE — Telephone Encounter (Signed)
Pt called in stating that around April 3rd, 2023 that she started off with a cough... Pt stated that she thought the cough was allergies... Pt stated that next her chest started hurting... pt stated that around April 10,2023 that she went to a walk in clinic because she really wasn't feel well.... Pt stated that she was diagnose with left lower lobe pneumonia.... Pt stated that she was prescribe with moxifloxacin for 7 days... Pt stated that she is not feeling any better... Pt stated that she feel weaker today... Pt stated that she have shortness of breathe, no fever, negative for Covid and have a cough. Pt was transfer to access nurse. No avail appt...  ?

## 2021-11-03 ENCOUNTER — Ambulatory Visit (INDEPENDENT_AMBULATORY_CARE_PROVIDER_SITE_OTHER): Payer: Medicare Other | Admitting: Internal Medicine

## 2021-11-03 ENCOUNTER — Encounter: Payer: Self-pay | Admitting: Internal Medicine

## 2021-11-03 VITALS — BP 124/70 | HR 77 | Temp 99.4°F | Resp 14 | Ht 64.5 in | Wt 228.4 lb

## 2021-11-03 DIAGNOSIS — R051 Acute cough: Secondary | ICD-10-CM

## 2021-11-03 DIAGNOSIS — J189 Pneumonia, unspecified organism: Secondary | ICD-10-CM | POA: Diagnosis not present

## 2021-11-03 DIAGNOSIS — J9801 Acute bronchospasm: Secondary | ICD-10-CM

## 2021-11-03 MED ORDER — BENZONATATE 200 MG PO CAPS: 200.0000 mg | ORAL_CAPSULE | Freq: Three times a day (TID) | ORAL | 0 refills | Status: DC | PRN

## 2021-11-03 MED ORDER — LEVOFLOXACIN 750 MG PO TABS: 750.0000 mg | ORAL_TABLET | Freq: Every day | ORAL | 0 refills | Status: DC

## 2021-11-03 MED ORDER — DM-GUAIFENESIN ER 60-1200 MG PO TB12: 1.0000 | ORAL_TABLET | Freq: Two times a day (BID) | ORAL | 0 refills | Status: DC

## 2021-11-03 MED ORDER — ALBUTEROL SULFATE HFA 108 (90 BASE) MCG/ACT IN AERS: 1.0000 | INHALATION_SPRAY | Freq: Four times a day (QID) | RESPIRATORY_TRACT | 2 refills | Status: DC | PRN

## 2021-11-03 NOTE — Patient Instructions (Addendum)
Mucinex dm or robitussin dm for cough  ?Call back for chest xray in 6 weeks from 10/23/21 please ? ?Community-Acquired Pneumonia, Adult ?Pneumonia is a lung infection that causes inflammation and the buildup of mucus and fluids in the lungs. This may cause coughing and difficulty breathing. Community-acquired pneumonia is pneumonia that develops in people who are not, and have not recently been, in a hospital or other health care facility. ?Usually, pneumonia develops as a result of an illness that is caused by a virus, such as the common cold and the flu (influenza). It can also be caused by bacteria or fungi. While the common cold and influenza can pass from person to person (are contagious), pneumonia itself is not considered contagious. ?What are the causes? ?This condition may be caused by: ?Viruses. ?Bacteria. ?Fungi, such as molds or mushrooms. ?What increases the risk? ?The following factors may make you more likely to develop this condition: ?Having certain medical conditions, such as: ?A long-term (chronic) disease, which may include chronic obstructive pulmonary disease (COPD), asthma, heart failure, cystic fibrosis, diabetes, kidney disease, sickle cell disease, and human immunodeficiency virus (HIV). ?A condition that increases the risk of breathing in (aspirating) mucus and other fluids from your mouth and nose. ?A weakened body defense system (immune system). ?Having had your spleen removed (splenectomy). The spleen is the organ that helps fight germs and infections. ?Not cleaning your teeth and gums well (poor dental hygiene). ?Using tobacco products. ?Traveling to places where germs that cause pneumonia are present. ?Being near certain animals, or animal habitats, that have germs that cause pneumonia. ?Being older than 76 years of age. ?What are the signs or symptoms? ?Symptoms of this condition include: ?A dry cough or a wet (productive) cough. ?A fever. ?Sweating or chills. ?Chest pain, especially  when breathing deeply or coughing. ?Fast breathing, difficulty breathing, or shortness of breath. ?Tiredness (fatigue). ?Muscle aches. ?How is this diagnosed? ?This condition may be diagnosed based on your medical history or a physical exam. You may also have tests, including: ?Chest X-rays. ?Tests of the level of oxygen and other gases in your blood. ?Tests of: ?Your blood. ?Mucus from your lungs (sputum). ?Fluid around your lungs (pleural fluid). ?Your urine. ?If your pneumonia is severe, other tests may be done to learn more about the cause. ?How is this treated? ?Treatment for this condition depends on many factors, such as the cause of your pneumonia, your medicines, and other medical conditions that you have. ?For most adults, pneumonia may be treated at home. In some cases, treatment must happen in a hospital and may include: ?Medicines that are given by mouth (orally) or through an IV, including: ?Antibiotic medicines, if bacteria caused the pneumonia. ?Medicines that kill viruses (antiviral medicines), if a virus caused the pneumonia. ?Oxygen therapy. ?Severe pneumonia, although rare, may require the following treatments: ?Mechanical ventilation.This procedure uses a machine to help you breathe if you cannot breathe well on your own or maintain a safe level of blood oxygen. ?Thoracentesis. This procedure removes any buildup of pleural fluid to help with breathing. ?Follow these instructions at home: ? ?Medicines ?Take over-the-counter and prescription medicines only as told by your health care provider. ?Take cough medicine only if you have trouble sleeping. Cough medicine can prevent your body from removing mucus from your lungs. ?If you were prescribed an antibiotic medicine, take it as told by your health care provider. Do not stop taking the antibiotic even if you start to feel better. ?Lifestyle ? ?  ? ?  Do not drink alcohol. ?Do not use any products that contain nicotine or tobacco, such as cigarettes,  e-cigarettes, and chewing tobacco. If you need help quitting, ask your health care provider. ?Eat a healthy diet. This includes plenty of vegetables, fruits, whole grains, low-fat dairy products, and lean protein. ?General instructions ?Rest a lot and get at least 8 hours of sleep each night. ?Sleep in a partly upright position at night. Place a few pillows under your head or sleep in a reclining chair. ?Return to your normal activities as told by your health care provider. Ask your health care provider what activities are safe for you. ?Drink enough fluid to keep your urine pale yellow. This helps to thin the mucus in your lungs. ?If your throat is sore, gargle with a salt-water mixture 3-4 times a day or as needed. To make a salt-water mixture, completely dissolve ?-1 tsp (3-6 g) of salt in 1 cup (237 mL) of warm water. ?Keep all follow-up visits as told by your health care provider. This is important. ?How is this prevented? ?You can lower your risk of developing community-acquired pneumonia by: ?Getting the pneumonia vaccine. There are different types and schedules of pneumonia vaccines. Ask your health care provider which option is best for you. Consider getting the pneumonia vaccine if: ?You are older than 76 years of age. ?You are 37-80 years of age and are receiving cancer treatment, have chronic lung disease, or have other medical conditions that affect your immune system. Ask your health care provider if this applies to you. ?Getting your influenza vaccine every year. Ask your health care provider which type of vaccine is best for you. ?Getting regular dental checkups. ?Washing your hands often with soap and water for at least 20 seconds. If soap and water are not available, use hand sanitizer. ?Contact a health care provider if you have: ?A fever. ?Trouble sleeping because you cannot control your cough with cough medicine. ?Get help right away if: ?Your shortness of breath becomes worse. ?Your chest pain  increases. ?Your sickness becomes worse, especially if you are an older adult or have a weak immune system. ?You cough up blood. ?These symptoms may represent a serious problem that is an emergency. Do not wait to see if the symptoms will go away. Get medical help right away. Call your local emergency services (911 in the [place] Do not drive yourself to the hospital. ?Summary ?Pneumonia is an infection of the lungs. ?Community-acquired pneumonia develops in people who have not been in the hospital. It can be caused by bacteria, viruses, or fungi. ?This condition may be treated with antibiotics or antiviral medicines. ?Severe pneumonia may require a hospital stay and treatment to help with breathing. ?This information is not intended to replace advice given to you by your health care provider. Make sure you discuss any questions you have with your health care provider. ?Document Revised: 04/14/2019 Document Reviewed: 04/14/2019 ?Elsevier Patient Education ? [place]. ? ?

## 2021-11-03 NOTE — Progress Notes (Signed)
Chief Complaint  ?Patient presents with  ? Acute Visit  ?  L lower lobe pneumonia, not feeling any better ongoing x 7days. Was seen by [facility] was prescribed moxifloxacin last dose Sunday. Now treating with otc mucinex with minor relief. Pt c/o sob while sitting,lying standing. Cough makes her dizzy at times if continuous. CXR at [facility]  ? ?F/u  ?1. 10/23/21 lll pneumonia c/o reduced appetite and at times chokes with her food and coughing so much throat is sore, she also has +PND, low grade fever phelgm to dry cough. She was given 7 days of avelox w/o relief but still fatigue and at times sob and coughing O2 96% today  ? ? ?Review of Systems  ?Respiratory:  Positive for cough, shortness of breath and wheezing.   ?Past Medical History:  ?Diagnosis Date  ? (HFpEF) heart failure with preserved ejection fraction ([place])   ? a. 12/2019 Echo: EF 65-70%, Gr2 DD. No significant valvular dzs.  ? Abnormality of gait 03/25/2013  ? Adrenal mass, left ([place])   ? Anginal pain ([place])   ? Anxiety   ? Aortic atherosclerosis ([place])   ? Arthritis   ? Asthma   ? CAD (coronary artery disease) with h/o Atypical Chest Pain   ? a. 04/1986 Cath ([facility]): nl cors, EF 65%; b. 07/2012 Cath [facility]): Diff minor irregs; c. 07/2016 MV [facility]): "Equivocal"; d. 08/2016 Cardiac CT Ca2+ score [facility]s): Ca2+ 4098; e. 05/2019 MV: No ischemia. EF 75%; f. 12/2019 PCI: LM nl, LAD 65ost (3.5x12 Resolute DES), 65m(2.75x12 Resolute DES),LCX/RCA nl; g. 08/2020 Cath: patent LAD stents, RCA 40ost, elev RH pressures, EF >65%.  ? Cervical spinal stenosis 1994  ? due to trauma to back ([employer] accident), has intermittent paralysis and parasthesias  ? Cervicogenic headache 03/23/2014  ? CKD (chronic kidney disease), stage III ([place]   ? Depression   ? Diverticulosis   ? Dizziness   ? a.) chronic  ? DJD (degenerative joint disease)   ? a. Chronic R shoulder pain  ? Dyspnea   ? Esophageal stenosis 09/2009  ? a.) transient outlet obstruction by food, cleared by EGD  ? Family history of adverse  reaction to anesthesia   ? a.) daughter with (+) PONV  ? Gastric bypass status for obesity   ? GERD (gastroesophageal reflux disease)   ? Headache(784.0)   ? HLD (hyperlipidemia)   ? Hypertension   ? IBS (irritable bowel syndrome)   ? IDA (iron deficiency anemia)   ? a.) post 2 unit txfsn 2009, [facility] ? Left bundle branch block (LBBB)   ? a.) Intermittently present - likely rate related.  ? NSVT (nonsustained ventricular tachycardia) ([place] 11/15/2020  ? a.) Holter study 11/15/2020; 11 episodes of NSVT lasting up to 8 beats with a maximum rate of 210 bpm; 14 atrial runs lasting up to 18 beats with a rate of up to 250 bpm.  Some atrial runs felt to be NSVT.  ? Obesity   ? OSA on CPAP   ? Polyneuropathy in diabetes(357.2) 03/25/2013  ? 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 Right V1 distribution  ? Restless leg syndrome   ? Rotator cuff arthropathy, right 08/13/2017  ? Syncope and collapse 03/12/2014  ? Type II diabetes mellitus ([place]   ? ?Past Surgical History:  ?Procedure Laterality Date  ? APPENDECTOMY    ? BACK SURGERY    ? CHOLECYSTECTOMY N/A 1976  ? COLONOSCOPY    ? CORONARY ANGIOPLASTY    ? CORONARY  STENT INTERVENTION N/A 01/04/2020  ? Procedure: CORONARY STENT INTERVENTION;  Surgeon: [name], MD;  Location: [place] CV [facility];  Service: Cardiovascular;  Laterality: N/A;  LAD   ? DIAPHRAGMATIC HERNIA REPAIR  2015  ? ESOPHAGEAL DILATION    ? multiple  ? ESOPHAGOGASTRODUODENOSCOPY (EGD) WITH PROPOFOL N/A 05/11/2021  ? Procedure: ESOPHAGOGASTRODUODENOSCOPY (EGD) WITH PROPOFOL;  Surgeon: [name], MD;  Location: [facility] ENDOSCOPY;  Service: Endoscopy;  Laterality: N/A;  ? ESOPHAGOGASTRODUODENOSCOPY (EGD) WITH PROPOFOL N/A 06/13/2021  ? Procedure: ESOPHAGOGASTRODUODENOSCOPY (EGD) WITH PROPOFOL;  Surgeon: [name], MD;  Location: [facility] ENDOSCOPY;  Service: Endoscopy;  Laterality: N/A;  ? EYE SURGERY    ? GASTRIC BYPASS  2000, 2005  ? Dr. Debroah Loop  ? INTERSTIM IMPLANT PLACEMENT   10/2011  ? [place]  ? INTERSTIM IMPLANT PLACEMENT N/A 09/18/2021  ? Procedure: INTERSTIM IMPLANT FIRST STAGE;  Surgeon: Bjorn Loser, MD;  Location: [facility] ORS;  Service: Urology;  Laterality: N/A;  ? INTERSTIM IMPLANT PLACEMENT N/A 09/18/2021  ? Procedure: INTERSTIM IMPLANT SECOND STAGE WITH IMPEDENCE CHECK;  Surgeon: Bjorn Loser, MD;  Location: [facility] ORS;  Service: Urology;  Laterality: N/A;  ? INTERSTIM IMPLANT REMOVAL N/A 09/18/2021  ? Procedure: REMOVAL OF INTERSTIM IMPLANT;  Surgeon: Bjorn Loser, MD;  Location: [facility] ORS;  Service: Urology;  Laterality: N/A;  ? LEFT HEART CATH AND CORONARY ANGIOGRAPHY N/A 04/1986  ? LEFT HEART CATH AND CORONARY ANGIOGRAPHY Left 07/24/2012  ? LEFT HEART CATH AND CORS/GRAFTS ANGIOGRAPHY N/A 12/31/2019  ? Procedure: LEFT HEART CATH AND CORS/GRAFTS ANGIOGRAPHY;  Surgeon: [name], MD;  Location: [place] CV LAB;  Service: Cardiovascular;  Laterality: N/A;  ? PANNICULECTOMY N/A 06/16/2019  ? Procedure: PANNICULECTOMY;  Surgeon: [name], MD;  Location: [place];  Service: Plastics;  Laterality: N/A;  3 hours, please  ? REVERSE SHOULDER ARTHROPLASTY Right 08/13/2017  ? Procedure: REVERSE RIGHT SHOULDER ARTHROPLASTY;  Surgeon: [name], MD;  Location: [place];  Service: Orthopedics;  Laterality: Right;  ? RIGHT/LEFT HEART CATH AND CORONARY ANGIOGRAPHY N/A 09/06/2020  ? Procedure: RIGHT/LEFT HEART CATH AND CORONARY ANGIOGRAPHY;  Surgeon: [name], MD;  Location: [place] CV LAB;  Service: Cardiovascular;  Laterality: N/A;  ? ROTATOR CUFF REPAIR Right   ? [place]  ? [place]  ? TOTAL ABDOMINAL HYSTERECTOMY W/ BILATERAL SALPINGOOPHORECTOMY  1974  ? TOTAL KNEE ARTHROPLASTY Bilateral 2007  ? Procedure: TOTAL KNEE ARTHROPLASTY; Surgeons: Mauri Pole, MD and [name], MD  ? UMBILICAL HERNIA REPAIR  08/11/2015  ? ?Family History  ?Problem Relation Age of Onset  ? Heart disease Father   ? Hypertension Father   ? Prostate cancer Father   ? Stroke  Father   ? Osteoporosis Father   ? Stroke Mother   ? Depression Mother   ? Headache Mother   ? Heart disease Mother   ? Thyroid disease Mother   ? Hypertension Mother   ? Diabetes Daughter   ? Heart disease Daughter   ? Hypertension Daughter   ? Hypertension Son   ? ?Social History  ? ?Socioeconomic History  ? Marital status: Married  ?  Spouse name: [name]   ? Number of children: 2  ? Years of education: College   ? Highest education level: Not on file  ?Occupational History  ?  Employer: retired  ?Tobacco Use  ? Smoking status: Never  ?  Passive exposure: Never  ? Smokeless tobacco: Never  ?Vaping Use  ? Vaping Use: Never used  ?Substance and Sexual  Activity  ? Alcohol use: No  ? Drug use: Not Currently  ?  Comment: prescribed valium  ? Sexual activity: Not Currently  ?  Birth control/protection: Post-menopausal, Surgical  ?Other Topics Concern  ? Not on file  ?Social History Narrative  ? Patient lives at home with husband [name].   ? Right Handed  ? Drinks caffeinated tea occasionally  ? ?Social Determinants of Health  ? ?Financial Resource Strain: Low Risk   ? Difficulty of Paying Living Expenses: Not hard at all  ?Food Insecurity: Not on file  ?Transportation Needs: Not on file  ?Physical Activity: Insufficiently Active  ? Days of Exercise per Week: 2 days  ? Minutes of Exercise per Session: 40 min  ?Stress: Not on file  ?Social Connections: Not on file  ?Intimate Partner Violence: Not on file  ? ?Current Meds  ?Medication Sig  ? albuterol (VENTOLIN HFA) 108 (90 Base) MCG/ACT inhaler Inhale 1-2 puffs into the lungs every 6 (six) hours as needed for wheezing or shortness of breath.  ? atorvastatin (LIPITOR) 80 MG tablet Take 1 tablet (80 mg total) by mouth daily.  ? benzonatate (TESSALON) 200 MG capsule Take 1 capsule (200 mg total) by mouth 3 (three) times daily as needed for cough.  ? clotrimazole-betamethasone (LOTRISONE) cream Apply 1 application topically 2 (two) times daily as needed (irritation).  ?  Continuous Blood Gluc Receiver (FREESTYLE LIBRE 2 READER) DEVI Use to check sugar at least TID  ? Continuous Blood Gluc Sensor (FREESTYLE LIBRE 2 SENSOR) MISC 1 application by Does not apply route every 14 (fourtee

## 2021-11-07 ENCOUNTER — Telehealth: Payer: Self-pay | Admitting: Internal Medicine

## 2021-11-07 DIAGNOSIS — Z20828 Contact with and (suspected) exposure to other viral communicable diseases: Secondary | ICD-10-CM | POA: Diagnosis not present

## 2021-11-07 NOTE — Telephone Encounter (Signed)
Spoke with pt and she stated that she would pick up the [facility] paperwork today.  ?

## 2021-11-07 NOTE — Telephone Encounter (Signed)
Pt called wanting an update on FMLA paperwork. Pt said to call on cell phone ? ?

## 2021-11-13 DIAGNOSIS — Z20822 Contact with and (suspected) exposure to covid-19: Secondary | ICD-10-CM | POA: Diagnosis not present

## 2021-11-13 HISTORY — PX: TOOTH EXTRACTION: SUR596

## 2021-11-16 ENCOUNTER — Ambulatory Visit (INDEPENDENT_AMBULATORY_CARE_PROVIDER_SITE_OTHER): Payer: Medicare Other | Admitting: Internal Medicine

## 2021-11-16 ENCOUNTER — Encounter: Payer: Self-pay | Admitting: Internal Medicine

## 2021-11-16 ENCOUNTER — Other Ambulatory Visit
Admission: RE | Admit: 2021-11-16 | Discharge: 2021-11-16 | Disposition: A | Discharge status: Home / Self Care | Payer: Medicare Other | Attending: Internal Medicine | Admitting: Internal Medicine

## 2021-11-16 VITALS — BP 130/64 | HR 66 | Ht 64.5 in | Wt 229.0 lb

## 2021-11-16 DIAGNOSIS — I5032 Chronic diastolic (congestive) heart failure: Secondary | ICD-10-CM

## 2021-11-16 DIAGNOSIS — I25118 Atherosclerotic heart disease of native coronary artery with other forms of angina pectoris: Secondary | ICD-10-CM

## 2021-11-16 DIAGNOSIS — E782 Mixed hyperlipidemia: Secondary | ICD-10-CM | POA: Diagnosis not present

## 2021-11-16 DIAGNOSIS — E1169 Type 2 diabetes mellitus with other specified complication: Secondary | ICD-10-CM

## 2021-11-16 DIAGNOSIS — E785 Hyperlipidemia, unspecified: Secondary | ICD-10-CM

## 2021-11-16 DIAGNOSIS — I10 Essential (primary) hypertension: Secondary | ICD-10-CM

## 2021-11-16 LAB — COMPREHENSIVE METABOLIC PANEL
ALT: 11 U/L (ref 0–44)
AST: 16 U/L (ref 15–41)
Albumin: 2.9 g/dL — ABNORMAL LOW (ref 3.5–5.0)
Alkaline Phosphatase: 150 U/L — ABNORMAL HIGH (ref 38–126)
Anion gap: 7 (ref 5–15)
BUN: 7 mg/dL — ABNORMAL LOW (ref 8–23)
CO2: 28 mmol/L (ref 22–32)
Calcium: 8.4 mg/dL — ABNORMAL LOW (ref 8.9–10.3)
Chloride: 105 mmol/L (ref 98–111)
Creatinine, Ser: 0.92 mg/dL (ref 0.44–1.00)
GFR, Estimated: >60 mL/min (ref >60)
Glucose, Bld: 112 mg/dL — ABNORMAL HIGH (ref 70–99)
Potassium: 4.1 mmol/L (ref 3.5–5.1)
Sodium: 140 mmol/L (ref 135–145)
Total Bilirubin: 0.6 mg/dL (ref 0.3–1.2)
Total Protein: 6.6 g/dL (ref 6.5–8.1)

## 2021-11-16 LAB — LIPID PANEL
Cholesterol: 135 mg/dL (ref 0–200)
HDL: 44 mg/dL (ref >40)
LDL Cholesterol: 75 mg/dL (ref 0–99)
Total CHOL/HDL Ratio: 3.1 RATIO
Triglycerides: 81 mg/dL (ref <150)
VLDL: 16 mg/dL (ref 0–40)

## 2021-11-16 MED ORDER — ATORVASTATIN CALCIUM 40 MG PO TABS: 80.0000 mg | ORAL_TABLET | Freq: Every day | ORAL | 0 refills | Status: DC

## 2021-11-16 NOTE — Progress Notes (Signed)
? ?Follow-up Outpatient Visit ?Date: 11/16/2021 ? ?Primary Care Provider: ?[name], MD ?[address] ?[place] [address] ? ?Chief Complaint: Follow-up coronary artery disease and HFpEF ? ?HPI:  Denise Sharp is a 76 y.o. female with history of CAD status post PCI to the ostial and mid LAD in the setting of unstable angina in 12/2019, chronic HFpEF, hypertension, type 2 diabetes mellitus, iron deficiency anemia following gastric bypass x2, irritable bowel syndrome, esophageal stenosis, chronic dizziness with gait instability, cervicogenic headache with cervical spine stenosis, obstructive sleep apnea on CPAP, restless leg syndrome, and adrenal mass, who presents for follow-up of coronary artery disease and chronic HFpEF.  I last saw her in the office in early February, at which time she was concerned about ongoing issues with her bladder stimulator device that needed to be replaced.  Due to urinary frequency, she was not taking her diuretics regularly.  She reported stable mild chest heaviness and exertional dyspnea.  We agreed to increase atorvastatin to 80 mg daily to improve her lipid control.  She was diagnosed with left lower lobe pneumonia last month.  She was treated with a 7-day course of moxifloxacin. ? ?Today, Ms. Yearwood has several complaints.  She is most concerned about dental pain that began a week or 2 after several teeth were pulled in a partial place.  She feels like the partial is too large and not fitting well.  She was taking tramadol that she still had on hand but has run out of this.  Acetaminophen does not seem to be controlling the pain.  She continues to have chest pain that began around the time of her pneumonia diagnosis.  It is gradually improving but has not resolved.  She just finished her second course of antibiotics.  Her shortness of breath is almost back to normal, though she still has chronic dyspnea on exertion.  She has not taken torsemide for at least 2  weeks because of having to leave her home for various appointments as well as to visit her husband who was recently hospitalized at [facility] for a cardiac procedure.  She feels like her legs are a little bit swollen, though her weight is actually down over the last few months. ? ?-------------------------------------------------------------------------------------------------- ? ?Past Medical History:  ?Diagnosis Date  ? (HFpEF) heart failure with preserved ejection fraction ([place])   ? a. 12/2019 Echo: EF 65-70%, Gr2 DD. No significant valvular dzs.  ? Abnormality of gait 03/25/2013  ? Adrenal mass, left ([place])   ? Anginal pain ([place])   ? Anxiety   ? Aortic atherosclerosis ([place])   ? Arthritis   ? Asthma   ? CAD (coronary artery disease) with h/o Atypical Chest Pain   ? a. 04/1986 Cath ([facility]): nl cors, EF 65%; b. 07/2012 Cath [facility]): Diff minor irregs; c. 07/2016 MV [facility]): "Equivocal"; d. 08/2016 Cardiac CT Ca2+ score [facility] Rolls): Ca2+ 6045; e. 05/2019 MV: No ischemia. EF 75%; f. 12/2019 PCI: LM nl, LAD 65ost (3.5x12 Resolute DES), 47m(2.75x12 Resolute DES),LCX/RCA nl; g. 08/2020 Cath: patent LAD stents, RCA 40ost, elev RH pressures, EF >65%.  ? Cervical spinal stenosis 1994  ? due to trauma to back ([employer] accident), has intermittent paralysis and parasthesias  ? Cervicogenic headache 03/23/2014  ? CKD (chronic kidney disease), stage III ([place]   ? Depression   ? Diverticulosis   ? Dizziness   ? a.) chronic  ? DJD (degenerative joint disease)   ? a. Chronic R shoulder pain  ?  Dyspnea   ? Esophageal stenosis 09/2009  ? a.) transient outlet obstruction by food, cleared by EGD  ? Family history of adverse reaction to anesthesia   ? a.) daughter with (+) PONV  ? Gastric bypass status for obesity   ? GERD (gastroesophageal reflux disease)   ? Headache(784.0)   ? HLD (hyperlipidemia)   ? Hypertension   ? IBS (irritable bowel syndrome)   ? IDA (iron deficiency anemia)   ? a.) post 2 unit txfsn 2009, normal endo/colonoscopy by [facility]  ? Left  bundle branch block (LBBB)   ? a.) Intermittently present - likely rate related.  ? NSVT (nonsustained ventricular tachycardia) ([place]) 11/15/2020  ? a.) Holter study 11/15/2020; 11 episodes of NSVT lasting up to 8 beats with a maximum rate of 210 bpm; 14 atrial runs lasting up to 18 beats with a rate of up to 250 bpm.  Some atrial runs felt to be NSVT.  ? Obesity   ? OSA on CPAP   ? Polyneuropathy in diabetes(357.2) 03/25/2013  ? 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 Right V1 distribution  ? Restless leg syndrome   ? Rotator cuff arthropathy, right 08/13/2017  ? Syncope and collapse 03/12/2014  ? Type II diabetes mellitus ([place])   ? ?Past Surgical History:  ?Procedure Laterality Date  ? APPENDECTOMY    ? BACK SURGERY    ? CHOLECYSTECTOMY N/A 1976  ? COLONOSCOPY    ? CORONARY ANGIOPLASTY    ? CORONARY STENT INTERVENTION N/A 01/04/2020  ? Procedure: CORONARY STENT INTERVENTION;  Surgeon: [name], MD;  Location: [place] CV [facility];  Service: Cardiovascular;  Laterality: N/A;  LAD   ? DIAPHRAGMATIC HERNIA REPAIR  2015  ? ESOPHAGEAL DILATION    ? multiple  ? ESOPHAGOGASTRODUODENOSCOPY (EGD) WITH PROPOFOL N/A 05/11/2021  ? Procedure: ESOPHAGOGASTRODUODENOSCOPY (EGD) WITH PROPOFOL;  Surgeon: [name], MD;  Location: [facility] ENDOSCOPY;  Service: Endoscopy;  Laterality: N/A;  ? ESOPHAGOGASTRODUODENOSCOPY (EGD) WITH PROPOFOL N/A 06/13/2021  ? Procedure: ESOPHAGOGASTRODUODENOSCOPY (EGD) WITH PROPOFOL;  Surgeon: [name], MD;  Location[facility]COPY;  Service: Endoscopy;  Laterality: N/A;  ? EYE SURGERY    ? GASTRIC BYPASS  2000, 2005  ? Dr. Debroah Loop  ? INTERSTIM IMPLANT PLACEMENT  10/2011  ? [place]  ? INTERSTIM IMPLANT PLACEMENT N/A 09/18/2021  ? Procedure: INTERSTIM IMPLANT FIRST STAGE;  Surgeon: Bjorn Loser, MD;  Location: [facility];  Service: Urology;  Laterality: N/A;  ? INTERSTIM IMPLANT PLACEMENT N/A 09/18/2021  ? Procedure: INTERSTIM IMPLANT SECOND STAGE WITH IMPEDENCE CHECK;  Surgeon: Bjorn Loser, MD;  Location: [facility] ORS;  Service: Urology;  Laterality: N/A;  ? INTERSTIM IMPLANT REMOVAL N/A 09/18/2021  ? Procedure: REMOVAL OF INTERSTIM IMPLANT;  Surgeon: Bjorn Loser, MD;  Location: [facility] OR[facility];  Service: Urology;  Laterality: N/A;  ? LEFT HEART CATH AND CORONARY ANGIOGRAPHY N/A 04/1986  ? LEFT HEART CATH AND CORONARY ANGIOGRAPHY Left 07/24/2012  ? LEFT HEART CATH AND CORS/GRAFTS ANGIOGRAPHY N/A 12/31/2019  ? Procedure: LEFT HEART CATH AND CORS/GRAFTS ANGIOGRAPHY;  Surgeon: [name], MD;  Location: [place] [facility];  Service: Cardiovascular;  Laterality: N/A;  ? PANNICULECTOMY N/A 06/16/2019  ? Procedure: PANNICULECTOMY;  Surgeon: [name], MD;  Location: [place];  Service: Plastics;  Laterality: N/A;  3 hours, please  ? REVERSE SHOULDER ARTHROPLASTY Right 08/13/2017  ? Procedure: REVERSE RIGHT SHOULDER ARTHROPLASTY;  Surgeon: [name], MD;  Location: [place];  Service: Orthopedics;  Laterality: Right;  ? RIGHT/LEFT HEART CATH AND CORONARY  ANGIOGRAPHY N/A 09/06/2020  ? Procedure: RIGHT/LEFT HEART CATH AND CORONARY ANGIOGRAPHY;  Surgeon: [name], MD;  Location: [place] [facility];  Service: Cardiovascular;  Laterality: N/A;  ? ROTATOR CUFF REPAIR Right   ? [place]  ? [place]  ? TOOTH EXTRACTION  11/13/2021  ? TOTAL ABDOMINAL HYSTERECTOMY W/ BILATERAL SALPINGOOPHORECTOMY  1974  ? TOTAL KNEE ARTHROPLASTY Bilateral 2007  ? Procedure: TOTAL KNEE ARTHROPLASTY; Surgeons: Mauri Pole, MD and [name], MD  ? UMBILICAL HERNIA REPAIR  08/11/2015  ? ? ?Current Meds  ?Medication Sig  ? albuterol (VENTOLIN HFA) 108 (90 Base) MCG/ACT inhaler Inhale 1-2 puffs into the lungs every 6 (six) hours as needed for wheezing or shortness of breath.  ? aspirin EC 81 MG tablet Take 81 mg by mouth daily. Swallow whole.  ? benzonatate (TESSALON) 200 MG capsule Take 1 capsule (200 mg total) by mouth 3 (three) times daily as needed for cough.  ? clotrimazole-betamethasone (LOTRISONE) cream  Apply 1 application topically 2 (two) times daily as needed (irritation).  ? Continuous Blood Gluc Receiver (FREESTYLE LIBRE 2 READER) DEVI Use to check sugar at least TID  ? Continuous Blood Gluc Sensor (FR

## 2021-11-16 NOTE — Patient Instructions (Signed)
Medication Instructions:  ? ?Your physician recommends that you continue on your current medications as directed. Please refer to the Current Medication list given to you today. ? ?*If you need a refill on your cardiac medications before your next appointment, please call your pharmacy* ? ? ?Lab Work: ? ?Today at the medical mall at [facility]: CMET, Lipid panel ? ?-  Please go to the [facility]  ?-  You will check in at the front desk to the right as you walk into the atrium.  ? ?If you have labs (blood work) drawn today and your tests are completely normal, you will receive your results only by: ?MyChart Message (if you have MyChart) OR ?A paper copy in the mail ?If you have any lab test that is abnormal or we need to change your treatment, we will call you to review the results. ? ? ?Testing/Procedures: ? ?None ordered ? ? ?Follow-Up: ?At [facility], you and your health needs are our priority.  As part of our continuing mission to provide you with exceptional heart care, we have created designated Provider Care Teams.  These Care Teams include your primary Cardiologist (physician) and Advanced Practice Providers (APPs -  Physician Assistants and Nurse Practitioners) who all work together to provide you with the care you need, when you need it. ? ?We recommend signing up for the patient portal called "MyChart".  Sign up information is provided on this After Visit Summary.  MyChart is used to connect with patients for Virtual Visits (Telemedicine).  Patients are able to view lab/test results, encounter notes, upcoming appointments, etc.  Non-urgent messages can be sent to your provider as well.   ?To learn more about what you can do with MyChart, go to [email or web address].   ? ?Your next appointment:   ?6 month(s) ? ?The format for your next appointment:   ?In Person ? ?Provider:   ?You may see [name], MD or one of the following Advanced Practice Providers on your designated Care Team:    ?Murray Hodgkins, NP ?Christell Faith, PA-C ?Cadence Kathlen Mody, PA-C{ ? ? ?Important Information About Sugar ? ? ? ? ? ? ?

## 2021-11-18 ENCOUNTER — Encounter: Payer: Self-pay | Admitting: Internal Medicine

## 2021-11-20 ENCOUNTER — Ambulatory Visit: Payer: Medicare Other | Admitting: Internal Medicine

## 2021-11-20 ENCOUNTER — Telehealth: Payer: Self-pay | Admitting: *Deleted

## 2021-11-20 NOTE — Telephone Encounter (Signed)
-----   Message from [name], MD sent at 11/20/2021 12:53 PM EDT ----- ?Please let Denise Sharp know that her kidney function and electrolytes are normal.  Her cholesterol has improved, though her LDL is still a little above goal.  I recommend that we increase atorvastatin to 80 mg daily, if she is agreeable.  He albumin has dropped since January, suggesting an element of malnutrition.  Alkaline phosphatase is also still elevated.  I recommend that she discuss these abnormalities with Denise Sharp at their next visit.  I will forward the results to Denise Sharp for her review as well. ?

## 2021-11-20 NOTE — Telephone Encounter (Signed)
Spoke with pt, notified of lab results and Dr. Darnelle Bos recc.  ?Pt voiced understanding. She is agreeable to incr atorvastatin to 80 mg daily, but would like to keep 40 mg tablet and take 2 daily, d/t 80 mg tablet is too large. Pt does not need refills at this time.  ? ?Pt has appt with [name] tomorrow and will discuss abnormal lab results at visit.  ? ?Pt has no further questions at this time.  ?

## 2021-11-21 ENCOUNTER — Ambulatory Visit (INDEPENDENT_AMBULATORY_CARE_PROVIDER_SITE_OTHER): Payer: Medicare Other | Admitting: Internal Medicine

## 2021-11-21 ENCOUNTER — Encounter: Payer: Self-pay | Admitting: Internal Medicine

## 2021-11-21 VITALS — BP 132/70 | HR 99 | Temp 98.4°F | Ht 64.5 in | Wt 229.4 lb

## 2021-11-21 DIAGNOSIS — Z794 Long term (current) use of insulin: Secondary | ICD-10-CM

## 2021-11-21 DIAGNOSIS — I10 Essential (primary) hypertension: Secondary | ICD-10-CM

## 2021-11-21 DIAGNOSIS — E11621 Type 2 diabetes mellitus with foot ulcer: Secondary | ICD-10-CM

## 2021-11-21 DIAGNOSIS — I200 Unstable angina: Secondary | ICD-10-CM

## 2021-11-21 DIAGNOSIS — L97529 Non-pressure chronic ulcer of other part of left foot with unspecified severity: Secondary | ICD-10-CM | POA: Diagnosis not present

## 2021-11-21 DIAGNOSIS — I471 Supraventricular tachycardia, unspecified: Secondary | ICD-10-CM

## 2021-11-21 DIAGNOSIS — E1140 Type 2 diabetes mellitus with diabetic neuropathy, unspecified: Secondary | ICD-10-CM

## 2021-11-21 DIAGNOSIS — E440 Moderate protein-calorie malnutrition: Secondary | ICD-10-CM

## 2021-11-21 DIAGNOSIS — I2609 Other pulmonary embolism with acute cor pulmonale: Secondary | ICD-10-CM

## 2021-11-21 DIAGNOSIS — E46 Unspecified protein-calorie malnutrition: Secondary | ICD-10-CM | POA: Insufficient documentation

## 2021-11-21 NOTE — Patient Instructions (Addendum)
You are malnourished ..  you should change your breakfast to scrambled eggs with cheese  ?instead of grits and/or oatmeal ? ?Eat  more fish  for protein  ? ? ?Bring your diabetes monitor so we can download your blood sugars.   ?

## 2021-11-21 NOTE — Progress Notes (Signed)
? ?Subjective:  ?Patient ID: Denise Sharp, female    DOB: 1946/05/01  Age: 76 y.o. MRN: [id number] ? ?CC: The primary encounter diagnosis was Essential hypertension. Diagnoses of Type 2 diabetes mellitus with diabetic neuropathy, without long-term current use of insulin ([place]), Type 2 diabetes mellitus with left diabetic foot ulcer ([place]), Other pulmonary embolism with acute cor pulmonale, unspecified chronicity ([facility]), PSVT (paroxysmal supraventricular tachycardia) ([place]), Moderate protein-calorie malnutrition ([place]), and Type 2 diabetes mellitus with diabetic neuropathy, with long-term current use of insulin ([place]) were also pertinent to this visit. ? ? ?This visit occurred during the SARS-CoV-2 public health emergency.  Safety protocols were in place, including screening questions prior to the visit, additional usage of staff PPE, and extensive cleaning of exam room while observing appropriate contact time as indicated for disinfecting solutions.   ?HPI ?[name] presents for  ?Chief Complaint  ?Patient presents with  ? Follow-up  ?  8 week follow up   ? ?1) T2DM:    fasting sugars have been 120 .  25% of the tie she notes that her  bedtime sugars are elevated to 200.  Taking Tresiba 10 units at  night and Farxiga in the morning .  Has novolog for prn use before dinner,  haven't had to use it . Uses a freestyle libre monitor  but has not brought it with her today. ? ?2) protein malnutrition:  "I can't drink those shakes,  they make me throw up"  has not adjusted to new dentures ,   cannot bite and chew.  Has an open wound of gum due to dehiscence . Eating oatmeal   grits or egg.  ? ?3) Recovered from a LLL pneumonia in April.  Cough has resolved.  ? ?Outpatient Medications Prior to Visit  ?Medication Sig Dispense Refill  ? albuterol (VENTOLIN HFA) 108 (90 Base) MCG/ACT inhaler Inhale 1-2 puffs into the lungs every 6 (six) hours as needed for wheezing or shortness of breath. 18 g 2  ? aspirin EC 81 MG  tablet Take 81 mg by mouth daily. Swallow whole.    ? atorvastatin (LIPITOR) 40 MG tablet Take 2 tablets (80 mg total) by mouth daily. 180 tablet 0  ? clotrimazole-betamethasone (LOTRISONE) cream Apply 1 application topically 2 (two) times daily as needed (irritation).    ? Continuous Blood Gluc Receiver (FREESTYLE LIBRE 2 READER) DEVI Use to check sugar at least TID 1 each 1  ? Continuous Blood Gluc Sensor (FREESTYLE LIBRE 2 SENSOR) MISC 1 application by Does not apply route every 14 (fourteen) days. 2 each 0  ? cyanocobalamin (,VITAMIN B-12,) 1000 MCG/ML injection Inject 1 mL (1,000 mcg total) into the muscle every 30 (thirty) days. Inject 1 ml (1000 mcg ) IM weekly x 4,  Then monthly thereafter 3 mL 1  ? dapagliflozin propanediol (FARXIGA) 10 MG TABS tablet Take 1 tablet (10 mg total) by mouth daily. 90 tablet 3  ? dicyclomine (BENTYL) 20 MG tablet Take 1 tablet (20 mg total) by mouth 3 (three) times daily before meals. 90 tablet 1  ? diphenoxylate-atropine (LOMOTIL) 2.5-0.025 MG tablet Take 1 tablet by mouth 4 (four) times daily as needed for diarrhea or loose stools. 60 tablet 5  ? fexofenadine (ALLEGRA) 180 MG tablet Take 180 mg by mouth as needed for allergies or rhinitis.    ? HYDROcodone-acetaminophen (NORCO/VICODIN) 5-325 MG tablet Take 1-2 tablets by mouth every 6 (six) hours as needed for moderate pain. 14 tablet 0  ? insulin  aspart (NOVOLOG FLEXPEN) 100 UNIT/ML FlexPen Inject 4-16 units with meals per sliding scale: If pre-meal glucose <100, inject 4 units; 100-150 - inject 6 units, 151-200 - inject 8 units; 201-250 - inject 10 units; 251-300 - inject 12 units, 301-350 - inject 14 units ; 350 > inject 16 units 15 mL 3  ? insulin degludec (TRESIBA FLEXTOUCH) 100 UNIT/ML FlexTouch Pen Inject 8 Units into the skin daily. 15 mL 3  ? Insulin Pen Needle (DROPLET PEN NEEDLES) 31G X 5 MM MISC USE ONE NEEDLE SUBCUTANEOUSLY AS DIRECTED (REMOVE AND DISCARD NEEDLE IN SHARPS CONTAINER IMMEDIATELY AFTER USE) 100 each  1  ? Multiple Vitamins-Minerals (CENTRUM SILVER 50+WOMEN PO) Take 1 tablet by mouth daily.    ? nitroGLYCERIN (NITROSTAT) 0.4 MG SL tablet Place 1 tablet (0.4 mg total) under the tongue every 5 (five) minutes as needed for chest pain. 25 tablet 1  ? nystatin (MYCOSTATIN/NYSTOP) powder Apply 1 g topically 4 (four) times daily as needed (rash).    ? ondansetron (ZOFRAN-ODT) 4 MG disintegrating tablet Take 1 tablet (4 mg total) by mouth every 8 (eight) hours as needed for nausea or vomiting. 30 tablet 0  ? OVER THE COUNTER MEDICATION Apply 1 application topically daily as needed (pain). [place] Gel topical pain reliever    ? pantoprazole (PROTONIX) 40 MG tablet TAKE 1 TABLET BY MOUTH EVERY DAY 30 tablet 2  ? pregabalin (LYRICA) 50 MG capsule Take 1 capsule (50 mg total) by mouth 2 (two) times daily. 60 capsule 3  ? sertraline (ZOLOFT) 25 MG tablet Take 1 tablet (25 mg total) by mouth daily. 90 tablet 1  ? spironolactone (ALDACTONE) 25 MG tablet Take 0.5 tablets (12.5 mg total) by mouth daily. 45 tablet 3  ? benzonatate (TESSALON) 200 MG capsule Take 1 capsule (200 mg total) by mouth 3 (three) times daily as needed for cough. (Patient not taking: Reported on 11/21/2021) 30 capsule 0  ? Dextromethorphan-Guaifenesin 60-1200 MG 12hr tablet Take 1 tablet by mouth every 12 (twelve) hours. Pull for patient coming to dry through (Patient not taking: Reported on 11/21/2021) 60 tablet 0  ? torsemide (DEMADEX) 20 MG tablet Take 1 tablet (20 mg total) by mouth daily. (Patient not taking: Reported on 11/16/2021) 30 tablet 2  ? ?No facility-administered medications prior to visit.  ? ? ?Review of Systems; ? ?Patient denies headache, fevers, malaise, unintentional weight loss, skin rash, eye pain, sinus congestion and sinus pain, sore throat, dysphagia,  hemoptysis , cough, dyspnea, wheezing, chest pain, palpitations, orthopnea, edema, abdominal pain, nausea, melena, diarrhea, constipation, flank pain, dysuria, hematuria, urinary  Frequency,  nocturia, numbness, tingling, seizures,  Focal weakness, Loss of consciousness,  Tremor, insomnia, depression, anxiety, and suicidal ideation.   ? ? ? ?Objective:  ?BP 132/70 (BP Location: Left Arm, Patient Position: Sitting, Cuff Size: Normal)   Pulse 99   Temp 98.4 ?F (36.9 ?C) (Oral)   Ht 5' 4.5" (1.638 m)   Wt 229 lb 6.4 oz (104.1 kg)   SpO2 94%   BMI 38.77 kg/m?  ? ?BP Readings from Last 3 Encounters:  ?11/21/21 132/70  ?11/16/21 130/64  ?11/03/21 124/70  ? ? ?Wt Readings from Last 3 Encounters:  ?11/21/21 229 lb 6.4 oz (104.1 kg)  ?11/16/21 229 lb (103.9 kg)  ?11/03/21 228 lb 6.4 oz (103.6 kg)  ? ? ?General appearance: alert, cooperative and appears stated age ?Ears: normal TM's and external ear canals both ears ?Throat: lips, mucosa, and tongue normal; teeth and gums normal ?Neck:  no adenopathy, no carotid bruit, supple, symmetrical, trachea midline and thyroid not enlarged, symmetric, no tenderness/mass/nodules ?Back: symmetric, no curvature. ROM normal. No CVA tenderness. ?Lungs: clear to auscultation bilaterally ?Heart: regular rate and rhythm, S1, S2 normal, no murmur, click, rub or gallop ?Abdomen: soft, non-tender; bowel sounds normal; no masses,  no organomegaly ?Pulses: 2+ and symmetric ?Skin: Skin color, texture, turgor normal. No rashes or lesions ?Lymph nodes: Cervical, supraclavicular, and axillary nodes normal. ? ?Lab Results  ?Component Value Date  ? HGBA1C 7.8 (H) 08/10/2021  ? HGBA1C 8.9 (H) 02/08/2021  ? HGBA1C 8.2 (A) 11/15/2020  ? ? ?Lab Results  ?Component Value Date  ? CREATININE 0.92 11/16/2021  ? CREATININE 1.21 (H) 09/05/2021  ? CREATININE 1.02 08/10/2021  ? ? ?Lab Results  ?Component Value Date  ? WBC 11.0 (H) 09/05/2021  ? HGB 13.5 09/05/2021  ? HCT 42.3 09/05/2021  ? PLT 275 09/05/2021  ? GLUCOSE 112 (H) 11/16/2021  ? CHOL 135 11/16/2021  ? TRIG 81 11/16/2021  ? HDL 44 11/16/2021  ? LDLDIRECT 111.0 06/16/2015  ? Gisela 75 11/16/2021  ? ALT 11 11/16/2021  ? AST 16 11/16/2021   ? NA 140 11/16/2021  ? K 4.1 11/16/2021  ? CL 105 11/16/2021  ? CREATININE 0.92 11/16/2021  ? BUN 7 (L) 11/16/2021  ? CO2 28 11/16/2021  ? TSH 2.209 02/08/2021  ? INR 1.1 12/31/2019  ? HGBA1C 7.8 (H) 01/26/202

## 2021-11-21 NOTE — Assessment & Plan Note (Addendum)
Managed with  tresiba 10 units Farxiga 10 mg .  She is unable to provide details of her elevated readings and has been asked to submit data fro her CBG monitor for review 

## 2021-11-21 NOTE — Assessment & Plan Note (Signed)
Secondary to prolonged edentulate status and intolerance for protein drinks.  Diet reviewed in detail and amended  ?

## 2021-11-22 DIAGNOSIS — Z20822 Contact with and (suspected) exposure to covid-19: Secondary | ICD-10-CM | POA: Diagnosis not present

## 2021-11-22 LAB — MICROALBUMIN / CREATININE URINE RATIO
Creatinine,U: 93.8 mg/dL
Microalb Creat Ratio: 0.7 mg/g (ref 0.0–30.0)
Microalb, Ur: <0.7 mg/dL (ref 0.0–1.9)

## 2021-11-22 LAB — HEMOGLOBIN A1C: Hgb A1c MFr Bld: 7.4 % — ABNORMAL HIGH (ref 4.6–6.5)

## 2021-12-18 ENCOUNTER — Telehealth: Payer: Medicare Other

## 2022-01-02 ENCOUNTER — Inpatient Hospital Stay: Payer: Medicare Other

## 2022-01-02 ENCOUNTER — Inpatient Hospital Stay: Payer: Medicare Other | Admitting: Internal Medicine

## 2022-01-04 ENCOUNTER — Other Ambulatory Visit: Payer: Self-pay

## 2022-01-04 DIAGNOSIS — E611 Iron deficiency: Secondary | ICD-10-CM

## 2022-01-04 MED FILL — Ferumoxytol Inj 510 MG/17ML (30 MG/ML) (Elemental Fe): INTRAVENOUS | Qty: 17 | Status: AC

## 2022-01-05 ENCOUNTER — Inpatient Hospital Stay (HOSPITAL_BASED_OUTPATIENT_CLINIC_OR_DEPARTMENT_OTHER): Payer: Medicare Other | Admitting: Internal Medicine

## 2022-01-05 ENCOUNTER — Encounter: Payer: Self-pay | Admitting: Internal Medicine

## 2022-01-05 ENCOUNTER — Inpatient Hospital Stay: Payer: Medicare Other

## 2022-01-05 ENCOUNTER — Inpatient Hospital Stay: Payer: Medicare Other | Attending: Internal Medicine

## 2022-01-05 DIAGNOSIS — E1121 Type 2 diabetes mellitus with diabetic nephropathy: Secondary | ICD-10-CM | POA: Diagnosis not present

## 2022-01-05 DIAGNOSIS — Z7984 Long term (current) use of oral hypoglycemic drugs: Secondary | ICD-10-CM | POA: Diagnosis not present

## 2022-01-05 DIAGNOSIS — K911 Postgastric surgery syndromes: Secondary | ICD-10-CM | POA: Insufficient documentation

## 2022-01-05 DIAGNOSIS — I2510 Atherosclerotic heart disease of native coronary artery without angina pectoris: Secondary | ICD-10-CM | POA: Insufficient documentation

## 2022-01-05 DIAGNOSIS — I5032 Chronic diastolic (congestive) heart failure: Secondary | ICD-10-CM | POA: Insufficient documentation

## 2022-01-05 DIAGNOSIS — Z9884 Bariatric surgery status: Secondary | ICD-10-CM | POA: Diagnosis not present

## 2022-01-05 DIAGNOSIS — E1122 Type 2 diabetes mellitus with diabetic chronic kidney disease: Secondary | ICD-10-CM | POA: Diagnosis not present

## 2022-01-05 DIAGNOSIS — Z794 Long term (current) use of insulin: Secondary | ICD-10-CM | POA: Diagnosis not present

## 2022-01-05 DIAGNOSIS — E611 Iron deficiency: Secondary | ICD-10-CM

## 2022-01-05 DIAGNOSIS — Z7982 Long term (current) use of aspirin: Secondary | ICD-10-CM | POA: Insufficient documentation

## 2022-01-05 DIAGNOSIS — I130 Hypertensive heart and chronic kidney disease with heart failure and stage 1 through stage 4 chronic kidney disease, or unspecified chronic kidney disease: Secondary | ICD-10-CM | POA: Diagnosis not present

## 2022-01-05 DIAGNOSIS — D508 Other iron deficiency anemias: Secondary | ICD-10-CM | POA: Insufficient documentation

## 2022-01-05 DIAGNOSIS — E876 Hypokalemia: Secondary | ICD-10-CM | POA: Diagnosis not present

## 2022-01-05 DIAGNOSIS — Z79899 Other long term (current) drug therapy: Secondary | ICD-10-CM | POA: Diagnosis not present

## 2022-01-05 DIAGNOSIS — N183 Chronic kidney disease, stage 3 unspecified: Secondary | ICD-10-CM | POA: Insufficient documentation

## 2022-01-05 LAB — CBC WITH DIFFERENTIAL/PLATELET
Abs Immature Granulocytes: 0.07 10*3/uL (ref 0.00–0.07)
Basophils Absolute: 0.1 10*3/uL (ref 0.0–0.1)
Basophils Relative: 1 %
Eosinophils Absolute: 0.2 10*3/uL (ref 0.0–0.5)
Eosinophils Relative: 1 %
HCT: 39.6 % (ref 36.0–46.0)
Hemoglobin: 12.8 g/dL (ref 12.0–15.0)
Immature Granulocytes: 1 %
Lymphocytes Relative: 38 %
Lymphs Abs: 4.3 10*3/uL — ABNORMAL HIGH (ref 0.7–4.0)
MCH: 29.7 pg (ref 26.0–34.0)
MCHC: 32.3 g/dL (ref 30.0–36.0)
MCV: 91.9 fL (ref 80.0–100.0)
Monocytes Absolute: 0.6 10*3/uL (ref 0.1–1.0)
Monocytes Relative: 5 %
Neutro Abs: 6.1 10*3/uL (ref 1.7–7.7)
Neutrophils Relative %: 54 %
Platelets: 334 10*3/uL (ref 150–400)
RBC: 4.31 MIL/uL (ref 3.87–5.11)
RDW: 15.3 % (ref 11.5–15.5)
WBC: 11.3 10*3/uL — ABNORMAL HIGH (ref 4.0–10.5)
nRBC: 0 % (ref 0.0–0.2)

## 2022-01-05 LAB — BASIC METABOLIC PANEL
Anion gap: 10 (ref 5–15)
BUN: 11 mg/dL (ref 8–23)
CO2: 28 mmol/L (ref 22–32)
Calcium: 7.9 mg/dL — ABNORMAL LOW (ref 8.9–10.3)
Chloride: 103 mmol/L (ref 98–111)
Creatinine, Ser: 1.11 mg/dL — ABNORMAL HIGH (ref 0.44–1.00)
GFR, Estimated: 52 mL/min — ABNORMAL LOW (ref >60)
Glucose, Bld: 119 mg/dL — ABNORMAL HIGH (ref 70–99)
Potassium: 2.8 mmol/L — ABNORMAL LOW (ref 3.5–5.1)
Sodium: 141 mmol/L (ref 135–145)

## 2022-01-05 LAB — VITAMIN B12: Vitamin B-12: 973 pg/mL — ABNORMAL HIGH (ref 180–914)

## 2022-01-05 LAB — IRON AND TIBC
Iron: 37 ug/dL (ref 28–170)
Saturation Ratios: 19 % (ref 10.4–31.8)
TIBC: 193 ug/dL — ABNORMAL LOW (ref 250–450)
UIBC: 156 ug/dL

## 2022-01-05 LAB — FERRITIN: Ferritin: 333 ng/mL — ABNORMAL HIGH (ref 11–307)

## 2022-01-07 ENCOUNTER — Encounter: Payer: Self-pay | Admitting: Internal Medicine

## 2022-01-10 ENCOUNTER — Telehealth: Payer: Self-pay | Admitting: Internal Medicine

## 2022-01-10 NOTE — Telephone Encounter (Signed)
Pt c/o of Chest Pain: STAT if CP now or developed within 24 hours  1. Are you having CP right now? yes  2. Are you experiencing any other symptoms (ex. SOB, nausea, vomiting, sweating)? Nausea,headache CP  3. How long have you been experiencing CP? Off & on several weeks getting worse  4. Is your CP continuous or coming and going? Continuous   5. Have you taken Nitroglycerin? no ?
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
## 2022-01-11 ENCOUNTER — Telehealth: Payer: Self-pay | Admitting: Internal Medicine

## 2022-01-11 ENCOUNTER — Encounter: Payer: Self-pay | Admitting: Nurse Practitioner

## 2022-01-11 ENCOUNTER — Ambulatory Visit (INDEPENDENT_AMBULATORY_CARE_PROVIDER_SITE_OTHER): Payer: Medicare Other | Admitting: Nurse Practitioner

## 2022-01-11 VITALS — BP 112/62 | HR 71 | Ht 64.5 in | Wt 232.0 lb

## 2022-01-11 DIAGNOSIS — R079 Chest pain, unspecified: Secondary | ICD-10-CM | POA: Diagnosis not present

## 2022-01-11 DIAGNOSIS — I200 Unstable angina: Secondary | ICD-10-CM

## 2022-01-11 DIAGNOSIS — R0602 Shortness of breath: Secondary | ICD-10-CM

## 2022-01-11 MED ORDER — ISOSORBIDE MONONITRATE ER 30 MG PO TB24: 15.0000 mg | ORAL_TABLET | Freq: Every day | ORAL | 3 refills | Status: DC

## 2022-01-11 NOTE — Telephone Encounter (Signed)
Called to schedule patient from cancellation with Sharolyn Douglas 6-29 at 310 pm

## 2022-01-11 NOTE — Telephone Encounter (Signed)
Pt states that she received all and would like a callback. Please advise

## 2022-01-11 NOTE — Progress Notes (Signed)
Office Visit    Patient Name: Denise Sharp Date of Encounter: 01/11/2022  Primary Care Provider:  [name], MD Primary Cardiologist:  [name], MD Primary Electrophysiologist: None  Chief Complaint    [name] is a 76 y.o. female with PMH of HFpEF, DM type II, HTN, CAD s/p PCI to ostial and mid LAD, IDA s/p gastric bypass x2, IBS, esophageal stenosis, chronic dizziness with gait instability, headache with cervical spinal stenosis, OSA on CPAP who presents today for complaint of chest pain.   Past Medical History    Past Medical History:  Diagnosis Date   (HFpEF) heart failure with preserved ejection fraction ([place])    a. 12/2019 Echo: EF 65-70%, Gr2 DD. No significant valvular dzs.   Abnormality of gait 03/25/2013   Adrenal mass, left (HCC)    Anginal pain (HCC)    Anxiety    Aortic atherosclerosis (HCC)    Arthritis    Asthma    CAD (coronary artery disease) with h/o Atypical Chest Pain    a. 04/1986 Cath ([facility]): nl cors, EF 65%; b. 07/2012 Cath [facility]): Diff minor irregs; c. 07/2016 MV [facility]): "Equivocal"; d. 08/2016 Cardiac CT Ca2+ score [facility]): Ca2+ 0539; e. 05/2019 MV: No ischemia. EF 75%; f. 12/2019 PCI: LM nl, LAD 65ost (3.5x12 Resolute DES), 65m(2.75x12 Resolute DES),LCX/RCA nl; g. 08/2020 Cath: patent LAD stents, RCA 40ost, elev RH pressures, EF >65%.   Cervical spinal stenosis 1994   due to trauma to back ([employer] accident), has intermittent paralysis and parasthesias   Cervicogenic headache 03/23/2014   CKD (chronic kidney disease), stage III (HCC)    Depression    Diverticulosis    Dizziness    a.) chronic   DJD (degenerative joint disease)    a. Chronic R shoulder pain   Dyspnea    Esophageal stenosis 09/2009   a.) transient outlet obstruction by food, cleared by EGD   Family history of adverse reaction to anesthesia    a.) daughter with (+) PONV   Gastric bypass status for obesity    GERD (gastroesophageal reflux disease)     Headache(784.0)    HLD (hyperlipidemia)    Hypertension    IBS (irritable bowel syndrome)    IDA (iron deficiency anemia)    a.) post 2 unit txfsn 2009, normal endo/colonoscopy by Wohl   Left bundle branch block (LBBB)    a.) Intermittently present - likely rate related.   NSVT (nonsustained ventricular tachycardia) ([place] 11/15/2020   a.) Holter study 11/15/2020; 11 episodes of NSVT lasting up to 8 beats with a maximum rate of 210 bpm; 14 atrial runs lasting up to 18 beats with a rate of up to 250 bpm.  Some atrial runs felt to be NSVT.   Obesity    OSA on CPAP    Polyneuropathy in diabetes(357.2) 03/25/2013   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/09/2021   Right V1 distribution   Restless leg syndrome    Rotator cuff arthropathy, right 08/13/2017   Syncope and collapse 03/12/2014   Type II diabetes mellitus ([place]    Past Surgical History:  Procedure Laterality Date   APPENDECTOMY     BACK SURGERY     CHOLECYSTECTOMY N/A 1976   COLONOSCOPY     CORONARY ANGIOPLASTY     CORONARY STENT INTERVENTION N/A 01/04/2020   Procedure: CORONARY STENT INTERVENTION;  Surgeon: AWellington Hampshire MD;  [facility];  Service: Cardiovascular;  Laterality: N/A;  LAD    DIAPHRAGMATIC  HERNIA REPAIR  2015   ESOPHAGEAL DILATION     multiple   ESOPHAGOGASTRODUODENOSCOPY (EGD) WITH PROPOFOL N/A 05/11/2021   Procedure: ESOPHAGOGASTRODUODENOSCOPY (EGD) WITH PROPOFOL;  Surgeon: [name], MD;  Location: [facility];  Service: Endoscopy;  Laterality: N/A;   ESOPHAGOGASTRODUODENOSCOPY (EGD) WITH PROPOFOL N/A 06/13/2021   Procedure: ESOPHAGOGASTRODUODENOSCOPY (EGD) WITH PROPOFOL;  Surgeon: [name], MD;  Location: [facility];  Service: Endoscopy;  Laterality: N/A;   EYE SURGERY     GASTRIC BYPASS  2000, 2005   Dr. [name] IMPLANT PLACEMENT  10/2011   Hollice Gong IMPLANT PLACEMENT N/A 09/18/2021   Procedure: Barrie Lyme IMPLANT FIRST STAGE;  Surgeon: Bjorn Loser,  MD;  Location: [facility];  Service: Urology;  Laterality: N/A;   INTERSTIM IMPLANT PLACEMENT N/A 09/18/2021   Procedure: Barrie Lyme IMPLANT SECOND STAGE WITH IMPEDENCE CHECK;  Surgeon: Bjorn Loser, MD;  Location: [facility];  Service: Urology;  Laterality: N/A;   INTERSTIM IMPLANT REMOVAL N/A 09/18/2021   Procedure: REMOVAL OF INTERSTIM IMPLANT;  Surgeon: Bjorn Loser, MD;  Location: [facility];  Service: Urology;  Laterality: N/A;   LEFT HEART CATH AND CORONARY ANGIOGRAPHY N/A 04/1986   LEFT HEART CATH AND CORONARY ANGIOGRAPHY Left 07/24/2012   LEFT HEART CATH AND CORS/GRAFTS ANGIOGRAPHY N/A 12/31/2019   Procedure: LEFT HEART CATH AND CORS/GRAFTS ANGIOGRAPHY;  Surgeon: [name], MD;  Location: [place] [facility];  Service: Cardiovascular;  Laterality: N/A;   PANNICULECTOMY N/A 06/16/2019   Procedure: PANNICULECTOMY;  Surgeon: [name], MD;  Location: [place];  Service: Plastics;  Laterality: N/A;  3 hours, please   REVERSE SHOULDER ARTHROPLASTY Right 08/13/2017   Procedure: REVERSE RIGHT SHOULDER ARTHROPLASTY;  Surgeon: [name], MD;  Location: [place];  Service: Orthopedics;  Laterality: Right;   RIGHT/LEFT HEART CATH AND CORONARY ANGIOGRAPHY N/A 09/06/2020   Procedure: RIGHT/LEFT HEART CATH AND CORONARY ANGIOGRAPHY;  Surgeon: [name], MD;  Location: [place] [facility];  Service: Cardiovascular;  Laterality: N/A;   ROTATOR CUFF REPAIR Right    [place] EXTRACTION  11/13/2021   TOTAL ABDOMINAL HYSTERECTOMY W/ BILATERAL SALPINGOOPHORECTOMY  1974   TOTAL KNEE ARTHROPLASTY Bilateral 2007   Procedure: TOTAL KNEE ARTHROPLASTY; Surgeons: Mauri Pole, MD and [name], MD   UMBILICAL HERNIA REPAIR  08/11/2015    Allergies  Allergies  Allergen Reactions   Demeclocycline Hives   Erythromycin Nausea And Vomiting and Other (See Comments)    Severe irritable bowel   Flagyl [Metronidazole] Nausea And Vomiting and Other (See Comments)     Severe irritable bowel   Glucophage [Metformin Hcl] Nausea And Vomiting and Other (See Comments)    "Sick" "I won't take anything that has metformin in it"   Tetracyclines & Related Hives and Rash   Diovan [Valsartan] Nausea Only        Sulfa Antibiotics Rash and Other (See Comments)    As child   Xanax [Alprazolam] Other (See Comments)    Unknown reaction    History of Present Illness    JORRYN HERSHBERGER is a 76 year old female with the above-mentioned past medical history presents today for complaint of chest pain.  This person has extensive cardiac history that dates back to 33.  LHC performed 07/2012 revealed minor luminal irregularities.  Subsequent ischemic evaluation via nuclear stress test 01/24/2019 showed low risk study.  2D echo performed 12/2019 with EF of 65-70%,, grade 2 DD with normal RV systolic function and no significant  valvular abnormalities.  Patient reported increased chest pain 2 weeks and was admitted to the ED.  She underwent LHC with staged PCI to the ostial and mid LAD.  Patient continued to have chest discomfort that responded to nitroglycerin however her symptoms persisted and she underwent R/LHC on 08/2020 that showed nonobstructive CAD with 40% ostial RCA stenosis and mLAD stent patent with tapering small vessel. She also was noted to have severely elevated right heart pressure and low cardiac output consistent with reduced EF. She was treated with GDMT and diuretics.     In February 2023 she was seen by Dr. Saunders Revel for surgical clearance for replacement of urinary bladder stimulator.  She reported some mild chest heaviness and exertional dyspnea during visit. She was also being treated for PNA and was on ABX.  Due to urinary frequency she was not taking her diuretics as prescribed.  She noted only mild leg edema and reported occasional low blood pressures.  And anginal agents were deferred at that time due to hypotension. Her atorvastatin was increased to 80 daily and lipid  panel and ALT was checked with plan to add ezetimibe or PCSK9 inhibitor if lipids are not improved.  She was last seen on 11/2021 in follow-up and was having ongoing chest pain that began when she contracted PNA and had not resolved.  Today she completed 2 rounds of antibiotics but stated that her shortness of breath was almost back to normal.  She was not using torsemide due to traveling back and forth to see her husband who was hospitalized at [facility] for cardiac condition.  She did note some increased swelling in her legs but her weight was significantly down compared to last month's visit.  Since last being seen in the office patient reports today that she has been having chest pain off and on that is more constant at times.  She rates her pain today at a 3 out of 4 but states that it fluctuates to a 6 out of 8 at times.  She has been taking her torsemide inconsistently but states today she took it and has experienced some diarrhea as well as urination.  She is not experiencing any chest pain similar to her lower lobe pneumonia pain.  She is also held all of her blood pressure and diabetic medications today due to not feeling well.  Her weight today is down 5 pounds from last week.  She denies  palpitations, dyspnea, PND, orthopnea, nausea, vomiting, dizziness, syncope, edema, weight gain, or early satiety.   Home Medications    Current Outpatient Medications  Medication Sig Dispense Refill   albuterol (VENTOLIN HFA) 108 (90 Base) MCG/ACT inhaler Inhale 1-2 puffs into the lungs every 6 (six) hours as needed for wheezing or shortness of breath. 18 g 2   aspirin EC 81 MG tablet Take 81 mg by mouth daily. Swallow whole.     atorvastatin (LIPITOR) 40 MG tablet Take 2 tablets (80 mg total) by mouth daily. 180 tablet 0   clotrimazole-betamethasone (LOTRISONE) cream Apply 1 application topically 2 (two) times daily as needed (irritation).     Continuous Blood Gluc Receiver (FREESTYLE LIBRE 2 READER) DEVI Use  to check sugar at least TID 1 each 1   Continuous Blood Gluc Sensor (FREESTYLE LIBRE 2 SENSOR) MISC 1 application by Does not apply route every 14 (fourteen) days. 2 each 0   cyanocobalamin (,VITAMIN B-12,) 1000 MCG/ML injection Inject 1 mL (1,000 mcg total) into the muscle every 30 (thirty) days.  Inject 1 ml (1000 mcg ) IM weekly x 4,  Then monthly thereafter 3 mL 1   dapagliflozin propanediol (FARXIGA) 10 MG TABS tablet Take 1 tablet (10 mg total) by mouth daily. 90 tablet 3   dicyclomine (BENTYL) 20 MG tablet Take 1 tablet (20 mg total) by mouth 3 (three) times daily before meals. 90 tablet 1   diphenoxylate-atropine (LOMOTIL) 2.5-0.025 MG tablet Take 1 tablet by mouth 4 (four) times daily as needed for diarrhea or loose stools. 60 tablet 5   fexofenadine (ALLEGRA) 180 MG tablet Take 180 mg by mouth as needed for allergies or rhinitis.     HYDROcodone-acetaminophen (NORCO/VICODIN) 5-325 MG tablet Take 1-2 tablets by mouth every 6 (six) hours as needed for moderate pain. 14 tablet 0   insulin aspart (NOVOLOG FLEXPEN) 100 UNIT/ML FlexPen Inject 4-16 units with meals per sliding scale: If pre-meal glucose <100, inject 4 units; 100-150 - inject 6 units, 151-200 - inject 8 units; 201-250 - inject 10 units; 251-300 - inject 12 units, 301-350 - inject 14 units ; 350 > inject 16 units 15 mL 3   insulin degludec (TRESIBA FLEXTOUCH) 100 UNIT/ML FlexTouch Pen Inject 8 Units into the skin daily. 15 mL 3   Insulin Pen Needle (DROPLET PEN NEEDLES) 31G X 5 MM MISC USE ONE NEEDLE SUBCUTANEOUSLY AS DIRECTED (REMOVE AND DISCARD NEEDLE IN SHARPS CONTAINER IMMEDIATELY AFTER USE) 100 each 1   Multiple Vitamins-Minerals (CENTRUM SILVER 50+WOMEN PO) Take 1 tablet by mouth daily.     nitroGLYCERIN (NITROSTAT) 0.4 MG SL tablet Place 1 tablet (0.4 mg total) under the tongue every 5 (five) minutes as needed for chest pain. 25 tablet 1   nystatin (MYCOSTATIN/NYSTOP) powder Apply 1 g topically 4 (four) times daily as needed  (rash).     ondansetron (ZOFRAN-ODT) 4 MG disintegrating tablet Take 1 tablet (4 mg total) by mouth every 8 (eight) hours as needed for nausea or vomiting. 30 tablet 0   OVER THE COUNTER MEDICATION Apply 1 application topically daily as needed (pain). [place] Gel topical pain reliever     pantoprazole (PROTONIX) 40 MG tablet TAKE 1 TABLET BY MOUTH EVERY DAY 30 tablet 2   pregabalin (LYRICA) 50 MG capsule Take 1 capsule (50 mg total) by mouth 2 (two) times daily. 60 capsule 3   sertraline (ZOLOFT) 25 MG tablet Take 1 tablet (25 mg total) by mouth daily. 90 tablet 1   spironolactone (ALDACTONE) 25 MG tablet Take 0.5 tablets (12.5 mg total) by mouth daily. 45 tablet 3   No current facility-administered medications for this visit.     Review of Systems  Please see the history of present illness.    (+) Waxing and waning chest pain (+) SOB and tiredness  All other systems reviewed and are otherwise negative except as noted above.  Physical Exam    Wt Readings from Last 3 Encounters:  01/05/22 237 lb 6.4 oz (107.7 kg)  11/21/21 229 lb 6.4 oz (104.1 kg)  11/16/21 229 lb (103.9 kg)   EH:UDJSH were no vitals filed for this visit.,There is no height or weight on file to calculate BMI.  Constitutional:      Appearance: Healthy appearance. Not in distress.  Neck:     Vascular: JVD normal.  Pulmonary:     Effort: Pulmonary effort is normal.     Breath sounds: No wheezing. No rales. Diminished in the bases Cardiovascular:     Normal rate. Regular rhythm. Normal S1. Normal S2.  Murmurs: There is no murmur.  Edema:    Trace ankle edema.  Abdominal:     Palpations: Abdomen is soft non tender. There is no hepatomegaly.  Skin:    General: Skin is warm and dry.  Neurological:     General: No focal deficit present.     Mental Status: Alert and oriented to person, place and time.     Cranial Nerves: Cranial nerves are intact.  EKG/LABS/Other Studies Reviewed    ECG personally reviewed by  me today -sinus rhythm with PACs and rate of 71 with no acute changes noted compared to previous EKG  Lab Results  Component Value Date   WBC 11.3 (H) 01/05/2022   HGB 12.8 01/05/2022   HCT 39.6 01/05/2022   MCV 91.9 01/05/2022   PLT 334 01/05/2022   Lab Results  Component Value Date   CREATININE 1.11 (H) 01/05/2022   BUN 11 01/05/2022   NA 141 01/05/2022   K 2.8 (L) 01/05/2022   CL 103 01/05/2022   CO2 28 01/05/2022   Lab Results  Component Value Date   ALT 11 11/16/2021   AST 16 11/16/2021   ALKPHOS 150 (H) 11/16/2021   BILITOT 0.6 11/16/2021   Lab Results  Component Value Date   CHOL 135 11/16/2021   HDL 44 11/16/2021   LDLCALC 75 11/16/2021   LDLDIRECT 111.0 06/16/2015   TRIG 81 11/16/2021   CHOLHDL 3.1 11/16/2021    Lab Results  Component Value Date   HGBA1C 7.4 (H) 11/21/2021    Assessment & Plan    1.  HFpEF: -Patient is fairly euvolemic on examination today with trace edema in her lower extremities.  She resumed a dose of her torsemide today and reported some diarrhea but otherwise is tolerating medication well. -She was advised to minimize her sodium intake and perform daily weights -Continue spironolactone 25 mg daily and torsemide if tolerated  2.  CAD/Stable Angina: -s/p PCI to the ostial and mid LAD in 2021 -Patient continues to endorse anginal chest pain that is waxing and waning in intensity and is relieved with as needed nitroglycerin at times. -We will order Lexiscan Myoview to evaluate for possible ischemic changes related to related chest pain -Start Imdur 15 mg daily -Patient advised to go to the ED if chest pain becomes excessive and increases in onset and intensity  3.  Hypertension:: -Patient's blood pressure today is well controlled at 112/62 -Continue current antihypertensive regimen  3.  Hyperlipidemia: -Most recent LDL cholesterol is 108 still above goal of less than 70 -No medication changes at this time due to ongoing chest pain  but may address at future appointment -Continue Lipitor 40 mg daily  4.  DM type II: -Patient reports blood sugar fluctuation to 224 today with previous control noted at home -Continue current diabetes management per PCP   Disposition: Follow-up with [name], MD or APP in 2 weeks    Medication Adjustments/Labs and Tests Ordered: Current medicines are reviewed at length with the patient today.  Concerns regarding medicines are outlined above.   Signed, Mable Fill, Marissa Nestle, NP 01/11/2022, 1:06 PM [place]

## 2022-01-11 NOTE — Patient Instructions (Signed)
Medication Instructions:  Your physician has recommended you make the following change in your medication:   START Imdur 15 mg daily   *If you need a refill on your cardiac medications before your next appointment, please call your pharmacy*   Lab Work: None ordered  If you have labs (blood work) drawn today and your tests are completely normal, you will receive your results only by: [place] (if you have MyChart) OR A paper copy in the mail If you have any lab test that is abnormal or we need to change your treatment, we will call you to review the results.   Testing/Procedures: [place]  Your caregiver has ordered a Stress Test with nuclear imaging. The purpose of this test is to evaluate the blood supply to your heart muscle. This procedure is referred to as a "Non-Invasive Stress Test." This is because other than having an IV started in your vein, nothing is inserted or "invades" your body. Cardiac stress tests are done to find areas of poor blood flow to the heart by determining the extent of coronary artery disease (CAD). Some patients exercise on a treadmill, which naturally increases the blood flow to your heart, while others who are  unable to walk on a treadmill due to physical limitations have a pharmacologic/chemical stress agent called Lexiscan . This medicine will mimic walking on a treadmill by temporarily increasing your coronary blood flow.   Please note: these test may take anywhere between 2-4 hours to complete  PLEASE REPORT TO [place] AT THE FIRST DESK WILL DIRECT YOU WHERE TO GO  Date of Procedure:_____________________________________  Arrival Time for Procedure:______________________________  Instructions regarding medication:   X : Hold diabetes medication morning of procedure   PLEASE NOTIFY THE OFFICE AT LEAST 24 HOURS IN ADVANCE IF YOU ARE UNABLE TO KEEP YOUR APPOINTMENT.  [phone] AND  PLEASE NOTIFY  NU[facility]AR [facility]ICINE AT [facility] AT LEAST 24 HOURS IN ADVANCE IF YOU ARE UNABLE TO KEEP YOUR APPOINTMENT. [phone]  How to prepare for your Myoview test:  Do not eat or drink after midnight No caffeine for 24 hours prior to test No smoking 24 hours prior to test. Your medication may be taken with water.  If your doctor stopped a medication because of this test, do not take that medication. Ladies, please do not wear dresses.  Skirts or pants are appropriate. Please wear a short sleeve shirt. No perfume, cologne or lotion. Wear comfortable walking shoes. No heels!   Follow-Up: At [facility], you and your health needs are our priority.  As part of our continuing mission to provide you with exceptional heart care, we have created designated Provider Care Teams.  These Care Teams include your primary Cardiologist (physician) and Advanced Practice Providers (APPs -  Physician Assistants and Nurse Practitioners) who all work together to provide you with the care you need, when you need it.  We recommend signing up for the patient portal called "MyChart".  Sign up information is provided on this After Visit Summary.  MyChart is used to connect with patients for Virtual Visits (Telemedicine).  Patients are able to view lab/test results, encounter notes, upcoming appointments, etc.  Non-urgent messages can be sent to your provider as well.   To learn more about what you can do with MyChart, go to [email or web address].    Your next appointment:   2-3 week(s)  The format for your next appointment:   In Person  Provider:  You may see [name], MD or one of the following Advanced Practice Providers on your designated Care Team:   Murray Hodgkins, NP Christell Faith, PA-C Cadence Kathlen Mody, [place]   Other Instructions N/A  Important Information About Sugar

## 2022-01-11 NOTE — Telephone Encounter (Signed)
Scheduled patient 01-11-22 per cancellation list with Sharolyn Douglas at 310 pm

## 2022-01-11 NOTE — Telephone Encounter (Signed)
Being addressed by scheduling.

## 2022-01-18 ENCOUNTER — Encounter
Admission: RE | Admit: 2022-01-18 | Discharge: 2022-01-18 | Disposition: A | Discharge status: Home / Self Care | Payer: Medicare Other | Source: Ambulatory Visit | Attending: Nurse Practitioner | Admitting: Nurse Practitioner

## 2022-01-18 ENCOUNTER — Other Ambulatory Visit: Payer: Self-pay | Admitting: Gastroenterology

## 2022-01-18 DIAGNOSIS — R079 Chest pain, unspecified: Secondary | ICD-10-CM | POA: Diagnosis not present

## 2022-01-18 DIAGNOSIS — R0602 Shortness of breath: Secondary | ICD-10-CM | POA: Insufficient documentation

## 2022-01-18 LAB — NM MYOCAR MULTI W/SPECT W/WALL MOTION / EF
Estimated workload: 1
Exercise duration (min): 0 min
Exercise duration (sec): 0 s
LV dias vol: 60 mL (ref 46–106)
LV sys vol: 23 mL
MPHR: 144 {beats}/min
Nuc Stress EF: 62 %
Peak HR: 83 {beats}/min
Percent HR: 57 %
Rest HR: 59 {beats}/min
Rest Nuclear Isotope Dose: 10.7 mCi
SDS: 3
SRS: 2
SSS: 5
ST Depression (mm): 0 mm
Stress Nuclear Isotope Dose: 31.7 mCi
TID: 0.96

## 2022-01-18 MED ORDER — REGADENOSON 0.4 MG/5ML IV SOLN
0.4000 mg | Freq: Once | INTRAVENOUS | Status: AC
Start: 2022-01-18 — End: 2022-01-18
  Administered 2022-01-18: 0.4 mg via INTRAVENOUS

## 2022-01-18 MED ORDER — TECHNETIUM TC 99M TETROFOSMIN IV KIT
10.6800 | PACK | Freq: Once | INTRAVENOUS | Status: AC | PRN
  Administered 2022-01-18: 10.68 via INTRAVENOUS

## 2022-01-18 MED ORDER — TECHNETIUM TC 99M TETROFOSMIN IV KIT
31.7300 | PACK | Freq: Once | INTRAVENOUS | Status: AC | PRN
  Administered 2022-01-18: 31.73 via INTRAVENOUS

## 2022-01-28 ENCOUNTER — Encounter: Payer: Self-pay | Admitting: Internal Medicine

## 2022-01-28 ENCOUNTER — Other Ambulatory Visit: Payer: Self-pay | Admitting: Internal Medicine

## 2022-01-29 ENCOUNTER — Other Ambulatory Visit: Payer: Self-pay

## 2022-01-29 DIAGNOSIS — E113393 Type 2 diabetes mellitus with moderate nonproliferative diabetic retinopathy without macular edema, bilateral: Secondary | ICD-10-CM | POA: Diagnosis not present

## 2022-01-29 DIAGNOSIS — E1142 Type 2 diabetes mellitus with diabetic polyneuropathy: Secondary | ICD-10-CM

## 2022-01-29 MED ORDER — DAPAGLIFLOZIN PROPANEDIOL 10 MG PO TABS: 10.0000 mg | ORAL_TABLET | Freq: Every day | ORAL | 0 refills | Status: DC

## 2022-01-29 NOTE — Telephone Encounter (Signed)
Refilled 07/21/21 Last OV 11/21/21 Next OV not scheduled

## 2022-02-01 ENCOUNTER — Other Ambulatory Visit: Payer: Self-pay

## 2022-02-01 ENCOUNTER — Encounter: Payer: Self-pay | Admitting: Internal Medicine

## 2022-02-01 ENCOUNTER — Encounter: Payer: Self-pay | Admitting: Podiatry

## 2022-02-01 ENCOUNTER — Ambulatory Visit (INDEPENDENT_AMBULATORY_CARE_PROVIDER_SITE_OTHER): Payer: Medicare Other | Admitting: Podiatry

## 2022-02-01 ENCOUNTER — Ambulatory Visit (INDEPENDENT_AMBULATORY_CARE_PROVIDER_SITE_OTHER): Payer: Medicare Other | Admitting: Internal Medicine

## 2022-02-01 VITALS — BP 118/72 | HR 68 | Ht 64.5 in | Wt 233.8 lb

## 2022-02-01 DIAGNOSIS — M79676 Pain in unspecified toe(s): Secondary | ICD-10-CM | POA: Diagnosis not present

## 2022-02-01 DIAGNOSIS — I5032 Chronic diastolic (congestive) heart failure: Secondary | ICD-10-CM

## 2022-02-01 DIAGNOSIS — I25118 Atherosclerotic heart disease of native coronary artery with other forms of angina pectoris: Secondary | ICD-10-CM | POA: Diagnosis not present

## 2022-02-01 DIAGNOSIS — B351 Tinea unguium: Secondary | ICD-10-CM

## 2022-02-01 DIAGNOSIS — E1149 Type 2 diabetes mellitus with other diabetic neurological complication: Secondary | ICD-10-CM

## 2022-02-01 DIAGNOSIS — E785 Hyperlipidemia, unspecified: Secondary | ICD-10-CM

## 2022-02-01 DIAGNOSIS — D689 Coagulation defect, unspecified: Secondary | ICD-10-CM

## 2022-02-01 DIAGNOSIS — G453 Amaurosis fugax: Secondary | ICD-10-CM | POA: Diagnosis not present

## 2022-02-01 DIAGNOSIS — I10 Essential (primary) hypertension: Secondary | ICD-10-CM | POA: Diagnosis not present

## 2022-02-01 DIAGNOSIS — E1169 Type 2 diabetes mellitus with other specified complication: Secondary | ICD-10-CM | POA: Diagnosis not present

## 2022-02-01 MED ORDER — RANOLAZINE ER 500 MG PO TB12: 500.0000 mg | ORAL_TABLET | Freq: Two times a day (BID) | ORAL | 0 refills | Status: DC

## 2022-02-01 NOTE — Progress Notes (Signed)
This patient returns to my office for at risk foot care.  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  Patient is taking plavix..  This patient is unable to cut nails herself since the patient cannot reach her nails.These nails are painful walking and wearing shoes.  This patient presents for at risk foot care today.  General Appearance  Alert, conversant and in no acute stress.  Vascular  Dorsalis pedis and posterior tibial  pulses are palpable  bilaterally.  Capillary return is within normal limits  bilaterally. Temperature is within normal limits  bilaterally.  Neurologic  Senn-Weinstein monofilament wire test absent   bilaterally. Muscle power within normal limits bilaterally.  Nails Thick disfigured discolored nails with subungual debris  from hallux to fifth toes bilaterally. No evidence of bacterial infection or drainage bilaterally.  Orthopedic  No limitations of motion  feet .  No crepitus or effusions noted.  No bony pathology or digital deformities noted. Charcot foot.  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  Skin  normotropic skin with no porokeratosis noted bilaterally.  No signs of infections or ulcers noted.  Laceration second toe right foot.  No bleeding or redness noted.   Onychomycosis  Pain in right toes  Pain in left toes  Consent was obtained for treatment procedures.   Mechanical debridement of nails 1-5  bilaterally performed with a nail nipper.  Filed with dremel without incident.    Return office visit    3 months                  Told patient to return for periodic foot care and evaluation due to potential at risk complications.   Gardiner Barefoot DPM

## 2022-02-01 NOTE — Patient Instructions (Signed)
Medication Instructions:  STOP Imdur  START Ranexa 500 mg 2 times a day  *If you need a refill on your cardiac medications before your next appointment, please call your pharmacy*   Lab Work: NONE ordered at this time of appointment   If you have labs (blood work) drawn today and your tests are completely normal, you will receive your results only by: [place] (if you have MyChart) OR A paper copy in the mail If you have any lab test that is abnormal or we need to change your treatment, we will call you to review the results.   Testing/Procedures: Your physician has requested that you have a carotid duplex. This test is an ultrasound of the carotid arteries in your neck. It looks at blood flow through these arteries that supply the brain with blood. Allow one hour for this exam. There are no restrictions or special instructions.   Follow-Up: At [facility], you and your health needs are our priority.  As part of our continuing mission to provide you with exceptional heart care, we have created designated Provider Care Teams.  These Care Teams include your primary Cardiologist (physician) and Advanced Practice Providers (APPs -  Physician Assistants and Nurse Practitioners) who all work together to provide you with the care you need, when you need it.   Your next appointment:   4-6 week(s)  The format for your next appointment:   In Person  Provider:   You may see [name], MD or one of the following Advanced Practice Providers on your designated Care Team:   Murray Hodgkins, NP Christell Faith, PA-C Cadence Kathlen Mody, [place]    Other Instructions   Important Information About Sugar

## 2022-02-01 NOTE — Progress Notes (Signed)
Follow-up Outpatient Visit Date: 02/01/2022  Primary Care Provider: [name], MD [place] [address]  Chief Complaint: Follow-up coronary artery disease and HFpEF  HPI:  Denise Sharp is a 76 y.o. female with history of CAD status post PCI to the ostial and mid LAD in the setting of unstable angina in 12/2019, chronic HFpEF, hypertension, type 2 diabetes mellitus, iron deficiency anemia following gastric bypass x2, irritable bowel syndrome, esophageal stenosis, chronic dizziness with gait instability, cervicogenic headache with cervical spine stenosis, obstructive sleep apnea on CPAP, restless leg syndrome, and adrenal mass, who presents for follow-up of CAD and HFpEF.  She was last seen in the office by Ambrose Pancoast, NP, in late June, at which time she reported intermittent chest pain.  She admitted to only using torsemide intermittently.  She was referred for pharmacologic MPI, which was low risk without evidence of ischemia or scar.  Today, [name] has multiple complaints.  She still feels exhausted all the time and feels like this is getting progressively worse.  She often notes that her blood pressures seem a bit low at home, which exacerbates her fatigue.  She has recently noticed that when she wakes up in the mornings, her vision is dark, as if someone has pulled a sheet over her eyes.  It often takes an hour of waking up for this to resolve.  She also continues to have daily chest pain.  It is not responsive to nitroglycerin and seems to be worsened when her blood sugar is high.  Notably, however, she feels like her overall chest pain is a bit better since isosorbide mononitrate was added at her last visit.  She notes that her blood sugars continue to fluctuate significantly despite being compliant with her medications and often times not eating anything.  Chronic exertional dyspnea is relatively stable.  She notes some leg edema but attributes this in  part to only taking her diuretic intermittently as it interferes with her activities, especially going to see multiple doctors.  She tries to take it at least every other day.  --------------------------------------------------------------------------------------------------  Past Medical History:  Diagnosis Date   (HFpEF) heart failure with preserved ejection fraction ([place])    a. 12/2019 Echo: EF 65-70%, Gr2 DD. No significant valvular dzs.   Abnormality of gait 03/25/2013   Adrenal mass, left (HCC)    Anginal pain (HCC)    Anxiety    Aortic atherosclerosis (HCC)    Arthritis    Asthma    CAD (coronary artery disease) with h/o Atypical Chest Pain    a. 04/1986 Cath ([facility]): nl cors, EF 65%; b. 07/2012 Cath [facility]): Diff minor irregs; c. 07/2016 MV [facility]): "Equivocal"; d. 08/2016 Cardiac CT Ca2+ score [facility]): Ca2+ 3716; e. 05/2019 MV: No ischemia. EF 75%; f. 12/2019 PCI: LM nl, LAD 65ost (3.5x12 Resolute DES), 44m(2.75x12 Resolute DES),LCX/RCA nl; g. 08/2020 Cath: patent LAD stents, RCA 40ost, elev RH pressures, EF >65%.   Cervical spinal stenosis 1994   due to trauma to back ([employer] accident), has intermittent paralysis and parasthesias   Cervicogenic headache 03/23/2014   CKD (chronic kidney disease), stage III (HCC)    Depression    Diverticulosis    Dizziness    a.) chronic   DJD (degenerative joint disease)    a. Chronic R shoulder pain   Dyspnea    Esophageal stenosis 09/2009   a.) transient outlet obstruction by food, cleared by EGD   Family history of adverse  reaction to anesthesia    a.) daughter with (+) PONV   Gastric bypass status for obesity    GERD (gastroesophageal reflux disease)    Headache(784.0)    HLD (hyperlipidemia)    Hypertension    IBS (irritable bowel syndrome)    IDA (iron deficiency anemia)    a.) post 2 unit txfsn 2009, normal endo/colonoscopy by [facility]   Left bundle branch block (LBBB)    a.) Intermittently present - likely rate related.   NSVT  (nonsustained ventricular tachycardia) ([place]) 11/15/2020   a.) Holter study 11/15/2020; 11 episodes of NSVT lasting up to 8 beats with a maximum rate of 210 bpm; 14 atrial runs lasting up to 18 beats with a rate of up to 250 bpm.  Some atrial runs felt to be NSVT.   NSVT (nonsustained ventricular tachycardia) ([place])    a. 10/2020 [facility]: 11 runs of NSVT up to 8 beats.   Obesity    OSA on CPAP    Polyneuropathy in diabetes(357.2) 03/25/2013   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/09/2021   Right V1 distribution   PSVT (paroxysmal supraventricular tachycardia) ([place])    a. 10/2020 [facility]: 14 atrial runs up to 18 beats, max HR 250.   Restless leg syndrome    Rotator cuff arthropathy, right 08/13/2017   Syncope and collapse 03/12/2014   Type II diabetes mellitus ([place])    Past Surgical History:  Procedure Laterality Date   APPENDECTOMY     BACK SURGERY     CHOLECYSTECTOMY N/A 1976   COLONOSCOPY     CORONARY ANGIOPLASTY     CORONARY STENT INTERVENTION N/A 01/04/2020   Procedure: CORONARY STENT INTERVENTION;  Surgeon: [name], MD;  Location: [place] [facility];  Service: Cardiovascular;  Laterality: N/A;  LAD    DIAPHRAGMATIC HERNIA REPAIR  2015   ESOPHAGEAL DILATION     multiple   ESOPHAGOGASTRODUODENOSCOPY (EGD) WITH PROPOFOL N/A 05/11/2021   Procedure: ESOPHAGOGASTRODUODENOSCOPY (EGD) WITH PROPOFOL;  Surgeon: [name], MD;  Location: [facility];  Service: Endoscopy;  Laterality: N/A;   ESOPHAGOGASTRODUODENOSCOPY (EGD) WITH PROPOFOL N/A 06/13/2021   Procedure: ESOPHAGOGASTRODUODENOSCOPY (EGD) WITH PROPOFOL;  Surgeon: [name], MD;  Location: [facility];  Service: Endoscopy;  Laterality: N/A;   EYE SURGERY     GASTRIC BYPASS  2000, 2005   Dr. [name] IMPLANT PLACEMENT  10/2011   Hollice Gong IMPLANT PLACEMENT N/A 09/18/2021   Procedure: Barrie Lyme IMPLANT FIRST STAGE;  Surgeon: Bjorn Loser, MD;  Location: [facility];  Service: Urology;  Laterality: N/A;    INTERSTIM IMPLANT PLACEMENT N/A 09/18/2021   Procedure: Barrie Lyme IMPLANT SECOND STAGE WITH IMPEDENCE CHECK;  Surgeon: Bjorn Loser, MD;  Location: [facility];  Service: Urology;  Laterality: N/A;   INTERSTIM IMPLANT REMOVAL N/A 09/18/2021   Procedure: REMOVAL OF INTERSTIM IMPLANT;  Surgeon: Bjorn Loser, MD;  Location: [facility];  Service: Urology;  Laterality: N/A;   LEFT HEART CATH AND CORONARY ANGIOGRAPHY N/A 04/1986   LEFT HEART CATH AND CORONARY ANGIOGRAPHY Left 07/24/2012   LEFT HEART CATH AND CORS/GRAFTS ANGIOGRAPHY N/A 12/31/2019   Procedure: LEFT HEART CATH AND CORS/GRAFTS ANGIOGRAPHY;  Surgeon: [name], MD;  Location: [place] [facility];  Service: Cardiovascular;  Laterality: N/A;   PANNICULECTOMY N/A 06/16/2019   Procedure: PANNICULECTOMY;  Surgeon: [name], MD;  Location: [place];  Service: Plastics;  Laterality: N/A;  3 hours, please   REVERSE SHOULDER ARTHROPLASTY Right 08/13/2017   Procedure: REVERSE RIGHT SHOULDER ARTHROPLASTY;  Surgeon:  [name], MD;  Location: [place];  Service: Orthopedics;  Laterality: Right;   RIGHT/LEFT HEART CATH AND CORONARY ANGIOGRAPHY N/A 09/06/2020   Procedure: RIGHT/LEFT HEART CATH AND CORONARY ANGIOGRAPHY;  Surgeon: [name], MD;  Location: [place] [facility];  Service: Cardiovascular;  Laterality: N/A;   ROTATOR CUFF REPAIR Right    [place] EXTRACTION  11/13/2021   TOTAL ABDOMINAL HYSTERECTOMY W/ BILATERAL SALPINGOOPHORECTOMY  1974   TOTAL KNEE ARTHROPLASTY Bilateral 2007   Procedure: TOTAL KNEE ARTHROPLASTY; Surgeons: Mauri Pole, MD and [name], MD   UMBILICAL HERNIA REPAIR  08/11/2015    Current Meds  Medication Sig   albuterol (VENTOLIN HFA) 108 (90 Base) MCG/ACT inhaler Inhale 1-2 puffs into the lungs every 6 (six) hours as needed for wheezing or shortness of breath.   aspirin EC 81 MG tablet Take 81 mg by mouth daily. Swallow whole.   atorvastatin (LIPITOR) 40 MG tablet Take  2 tablets (80 mg total) by mouth daily.   clotrimazole-betamethasone (LOTRISONE) cream Apply 1 application topically 2 (two) times daily as needed (irritation).   Continuous Blood Gluc Receiver (FREESTYLE LIBRE 2 READER) DEVI Use to check sugar at least TID   Continuous Blood Gluc Sensor (FREESTYLE LIBRE 2 SENSOR) MISC 1 application by Does not apply route every 14 (fourteen) days.   cyanocobalamin (,VITAMIN B-12,) 1000 MCG/ML injection Inject 1 mL (1,000 mcg total) into the muscle every 30 (thirty) days. Inject 1 ml (1000 mcg ) IM weekly x 4,  Then monthly thereafter   dapagliflozin propanediol (FARXIGA) 10 MG TABS tablet Take 1 tablet (10 mg total) by mouth daily.   dicyclomine (BENTYL) 20 MG tablet Take 1 tablet (20 mg total) by mouth 3 (three) times daily before meals.   diphenoxylate-atropine (LOMOTIL) 2.5-0.025 MG tablet Take 1 tablet by mouth 4 (four) times daily as needed for diarrhea or loose stools.   fexofenadine (ALLEGRA) 180 MG tablet Take 180 mg by mouth as needed for allergies or rhinitis.   HYDROcodone-acetaminophen (NORCO/VICODIN) 5-325 MG tablet Take 1-2 tablets by mouth every 6 (six) hours as needed for moderate pain.   insulin aspart (NOVOLOG FLEXPEN) 100 UNIT/ML FlexPen Inject 4-16 units with meals per sliding scale: If pre-meal glucose <100, inject 4 units; 100-150 - inject 6 units, 151-200 - inject 8 units; 201-250 - inject 10 units; 251-300 - inject 12 units, 301-350 - inject 14 units ; 350 > inject 16 units   insulin degludec (TRESIBA FLEXTOUCH) 100 UNIT/ML FlexTouch Pen Inject 8 Units into the skin daily.   Insulin Pen Needle (DROPLET PEN NEEDLES) 31G X 5 MM MISC USE ONE NEEDLE SUBCUTANEOUSLY AS DIRECTED (REMOVE AND DISCARD NEEDLE IN SHARPS CONTAINER IMMEDIATELY AFTER USE)   Multiple Vitamins-Minerals (CENTRUM SILVER 50+WOMEN PO) Take 1 tablet by mouth daily.   nitroGLYCERIN (NITROSTAT) 0.4 MG SL tablet Place 1 tablet (0.4 mg total) under the tongue every 5 (five) minutes as  needed for chest pain.   nystatin (MYCOSTATIN/NYSTOP) powder Apply 1 g topically 4 (four) times daily as needed (rash).   ondansetron (ZOFRAN-ODT) 4 MG disintegrating tablet Take 1 tablet (4 mg total) by mouth every 8 (eight) hours as needed for nausea or vomiting.   OVER THE COUNTER MEDICATION Apply 1 application topically daily as needed (pain). [place] Gel topical pain reliever   pantoprazole (PROTONIX) 40 MG tablet TAKE 1 TABLET BY MOUTH EVERY DAY   pregabalin (LYRICA) 50 MG capsule TAKE 1 CAPSULE BY MOUTH 2 TIMES DAILY.  sertraline (ZOLOFT) 25 MG tablet Take 1 tablet (25 mg total) by mouth daily.   spironolactone (ALDACTONE) 25 MG tablet Take 0.5 tablets (12.5 mg total) by mouth daily.   [DISCONTINUED] isosorbide mononitrate (IMDUR) 30 MG 24 hr tablet Take 0.5 tablets (15 mg total) by mouth daily.   [DISCONTINUED] ranolazine (RANEXA) 500 MG 12 hr tablet Take 1 tablet (500 mg total) by mouth 2 (two) times daily for 3 days.    Allergies: Demeclocycline, Erythromycin, Flagyl [metronidazole], Glucophage [metformin hcl], Tetracyclines & related, Diovan [valsartan], Sulfa antibiotics, and Xanax [alprazolam]  Social History   Tobacco Use   Smoking status: Never    Passive exposure: Never   Smokeless tobacco: Never  Vaping Use   Vaping Use: Never used  Substance Use Topics   Alcohol use: No   Drug use: Not Currently    Comment: prescribed valium    Family History  Problem Relation Age of Onset   Heart disease Father    Hypertension Father    Prostate cancer Father    Stroke Father    Osteoporosis Father    Stroke Mother    Depression Mother    Headache Mother    Heart disease Mother    Thyroid disease Mother    Hypertension Mother    Diabetes Daughter    Heart disease Daughter    Hypertension Daughter    Hypertension Son     Review of Systems: A 12-system review of systems was performed and was negative except as noted in the  HPI.  --------------------------------------------------------------------------------------------------  Physical Exam: BP 118/72   Pulse 68   Ht 5' 4.5" (1.638 m)   Wt 233 lb 12.8 oz (106.1 kg)   SpO2 96%   BMI 39.51 kg/m   General:  NAD.  Accompanied by her husband. Neck: No gross JVD though body habitus limits evaluation. Lungs: Mildly diminished breath sounds throughout without wheezes or crackles. Heart: Distant heart sounds without murmurs, rubs, or gallops. Abdomen: Soft, nontender, nondistended. Extremities: Trace pretibial edema bilaterally.  EKG: Normal sinus rhythm without abnormality.  Lab Results  Component Value Date   WBC 11.3 (H) 01/05/2022   HGB 12.8 01/05/2022   HCT 39.6 01/05/2022   MCV 91.9 01/05/2022   PLT 334 01/05/2022    Lab Results  Component Value Date   NA 141 01/05/2022   K 2.8 (L) 01/05/2022   CL 103 01/05/2022   CO2 28 01/05/2022   BUN 11 01/05/2022   CREATININE 1.11 (H) 01/05/2022   GLUCOSE 119 (H) 01/05/2022   ALT 11 11/16/2021    Lab Results  Component Value Date   CHOL 135 11/16/2021   HDL 44 11/16/2021   LDLCALC 75 11/16/2021   LDLDIRECT 111.0 06/16/2015   TRIG 81 11/16/2021   CHOLHDL 3.1 11/16/2021    --------------------------------------------------------------------------------------------------  ASSESSMENT AND PLAN: Coronary artery disease with stable angina: Ms. Martus continues to have daily atypical chest pain.  She notes that it is not responsive to nitroglycerin though interestingly seems to have gotten a bit better with isosorbide mononitrate.  However, since addition of isosorbide mononitrate, Ms. Stay reports low blood pressures and vision darkening.  I wonder if this may be due to the symptomatic hypotension.  Her blood pressure is normal today.  I recommended discontinuation of isosorbide mononitrate and initiation of ranolazine 500 mg twice daily.  Continue secondary prevention with aspirin and  atorvastatin.  Chronic HFpEF: Volume status difficult to assess though [name] continues to complain of leg edema in the  setting of inconsistent diuretic use (only current diuretic is spironolactone; unclear why she is no longer on a loop diuretic as she was encouraged to use torsemide regularly at our last visit in May).  Hypokalemia was noted last month at her visit with Dr. Rogue Bussing.  His notes indicate that she was still on torsemide at that time but I wonder if it was discontinued on account of her low potassium.  I have asked her to take spironolactone daily.  We will reassess need for addition of a loop diuretic at her follow-up visit, at which time BMP should be repeated.  Hypertension: Blood pressure well controlled today and low at times at home.  As above, we will discontinue isosorbide mononitrate.  No other medication changes at this time.  Hyperlipidemia associated with type 2 diabetes mellitus: Continue atorvastatin, though dose escalation may need to be considered in the future given most recent LDL was above goal at 63 in May.  Ongoing management of labile diabetes per [name].  Amaurosis fugax: Ms. Kamps describes darkening of her vision, usually when she first wakes up in the morning.  While atypical for cerebrovascular insufficiency, I think it would be prudent to repeat a carotid Doppler to exclude significant stenosis given her multiple risk factors.  Morbid obesity: BMI remains greater than 35 with multiple comorbidities including coronary artery disease, chronic HFpEF, and diabetes mellitus.  I have encouraged Ms. State to keep working on weight loss through diet and exercise.  Follow-up: Return to clinic in 4-6 weeks.  [name], MD 02/03/2022 11:20 AM

## 2022-02-03 ENCOUNTER — Encounter: Payer: Self-pay | Admitting: Internal Medicine

## 2022-02-05 ENCOUNTER — Telehealth: Payer: Self-pay | Admitting: Neurology

## 2022-02-05 ENCOUNTER — Ambulatory Visit (INDEPENDENT_AMBULATORY_CARE_PROVIDER_SITE_OTHER): Payer: Medicare Other

## 2022-02-05 DIAGNOSIS — G453 Amaurosis fugax: Secondary | ICD-10-CM

## 2022-02-05 NOTE — Telephone Encounter (Signed)
Denise Sharp's notes from 09/12/21: She has chronic neck pain, cervicogenic headache, has not been able to have MRI of the brain or cervical spine due to incompatible bladder implant, however this will be replaced in the near future, she is requesting to have imaging done, once she is clear, she will contact me and we can pursue this (MRI of the brain and cervical spine). ____________________________________ Her new bladder implant has been in place now since February 2023. She is able to have MRI scans now. She is asking for orders to be placed.

## 2022-02-05 NOTE — Telephone Encounter (Signed)
This patient left me a voice mail asking to schedule the MRI that was talked about at her last visit. Can you please orders please?

## 2022-02-06 ENCOUNTER — Encounter: Payer: Self-pay | Admitting: Internal Medicine

## 2022-02-06 ENCOUNTER — Ambulatory Visit (INDEPENDENT_AMBULATORY_CARE_PROVIDER_SITE_OTHER): Payer: Medicare Other | Admitting: Internal Medicine

## 2022-02-06 ENCOUNTER — Telehealth: Payer: Self-pay | Admitting: Internal Medicine

## 2022-02-06 VITALS — BP 118/62 | HR 84 | Temp 98.1°F | Ht 64.5 in | Wt 235.0 lb

## 2022-02-06 DIAGNOSIS — E11621 Type 2 diabetes mellitus with foot ulcer: Secondary | ICD-10-CM

## 2022-02-06 DIAGNOSIS — E1122 Type 2 diabetes mellitus with diabetic chronic kidney disease: Secondary | ICD-10-CM

## 2022-02-06 DIAGNOSIS — E1140 Type 2 diabetes mellitus with diabetic neuropathy, unspecified: Secondary | ICD-10-CM

## 2022-02-06 DIAGNOSIS — N183 Chronic kidney disease, stage 3 unspecified: Secondary | ICD-10-CM | POA: Diagnosis not present

## 2022-02-06 DIAGNOSIS — Z8669 Personal history of other diseases of the nervous system and sense organs: Secondary | ICD-10-CM | POA: Insufficient documentation

## 2022-02-06 NOTE — Assessment & Plan Note (Signed)
Poor glycemic control due to dietary non adherence and medical non adherence.  Instruction given.  Rare episodes of hypoglycemia due to using novolog inappropriately.  Hyperglycemia due to diet and improper use of sliding scale.  increase Tresiba to 20 units . contiue [place].

## 2022-02-06 NOTE — Progress Notes (Signed)
Subjective:  Patient ID: Denise Sharp, female    DOB: Jun 08, 1946  Age: 76 y.o. MRN: [id number]  CC: The primary encounter diagnosis was CKD stage 3 due to type 2 diabetes mellitus ([place]). Diagnoses of Type 2 diabetes mellitus with left diabetic foot ulcer ([place]), Hx of amaurosis fugax, and Type 2 diabetes mellitus with diabetic neuropathy, with long-term current use of insulin ([place]) were also pertinent to this visit.   HPI Denise Sharp presents for FOLLOW UP ON TYPE 2 DIABETES  Chief Complaint  Patient presents with   Follow-up    Follow up on diabetes    T2DM:  She  feels generally well,  But is not  exercising regularly or trying to lose weight. "I have a lot going on right now"  she is using the Freestyle Libe 2 for monitoring of blood sugars and review of data shows that she is out of range 40% of the time.   Very rarely has lows.  Had a syncopal episode yesterday attributed to hypoglycemic event that occurred around  3 PM  caused by taking Novolog 6 units at 2:30  pm FOR CBG 250.  Syncopal event occurred while shopping  CBG was 55  .  EMS was called and gave her soda.  Subsequent reading was 240.  Recalls one other episode that occurred while shopping .   Today her most recent reading is over 400.  She states that she is taking Farxiga 10 mg daily, novolog sliding scale ,  and taking 15 units of [place].drinks sweet tea twice daily .  Not following a low GI diet.    2) HAVING VISUAL "BLACKOUT SPELLS"  that occur upon waking every morning.  Vision goes dark for about 30 minutes.  Clarnce Flock her  optometrist Dr Gloriann Loan  last week.   Nothing wrong with her vision .  He warned her that the sertraline could be the cause.  She is taking 25 mg sertraline. Has not had follow up with ophthalmologist Dr  Tommy Rainwater since her cataract surgery.    Outpatient Medications Prior to Visit  Medication Sig Dispense Refill   albuterol (VENTOLIN HFA) 108 (90 Base) MCG/ACT inhaler Inhale 1-2 puffs into the  lungs every 6 (six) hours as needed for wheezing or shortness of breath. 18 g 2   aspirin EC 81 MG tablet Take 81 mg by mouth daily. Swallow whole.     atorvastatin (LIPITOR) 40 MG tablet Take 2 tablets (80 mg total) by mouth daily. 180 tablet 0   clotrimazole-betamethasone (LOTRISONE) cream Apply 1 application topically 2 (two) times daily as needed (irritation).     Continuous Blood Gluc Receiver (FREESTYLE LIBRE 2 READER) DEVI Use to check sugar at least TID 1 each 1   Continuous Blood Gluc Sensor (FREESTYLE LIBRE 2 SENSOR) MISC 1 application by Does not apply route every 14 (fourteen) days. 2 each 0   cyanocobalamin (,VITAMIN B-12,) 1000 MCG/ML injection Inject 1 mL (1,000 mcg total) into the muscle every 30 (thirty) days. Inject 1 ml (1000 mcg ) IM weekly x 4,  Then monthly thereafter 3 mL 1   dapagliflozin propanediol (FARXIGA) 10 MG TABS tablet Take 1 tablet (10 mg total) by mouth daily. 30 tablet 0   dicyclomine (BENTYL) 20 MG tablet Take 1 tablet (20 mg total) by mouth 3 (three) times daily before meals. 90 tablet 1   diphenoxylate-atropine (LOMOTIL) 2.5-0.025 MG tablet Take 1 tablet by mouth 4 (four) times daily as needed for  diarrhea or loose stools. 60 tablet 5   fexofenadine (ALLEGRA) 180 MG tablet Take 180 mg by mouth as needed for allergies or rhinitis.     HYDROcodone-acetaminophen (NORCO/VICODIN) 5-325 MG tablet Take 1-2 tablets by mouth every 6 (six) hours as needed for moderate pain. 14 tablet 0   insulin aspart (NOVOLOG FLEXPEN) 100 UNIT/ML FlexPen Inject 4-16 units with meals per sliding scale: If pre-meal glucose <100, inject 4 units; 100-150 - inject 6 units, 151-200 - inject 8 units; 201-250 - inject 10 units; 251-300 - inject 12 units, 301-350 - inject 14 units ; 350 > inject 16 units 15 mL 3   insulin degludec (TRESIBA FLEXTOUCH) 100 UNIT/ML FlexTouch Pen Inject 8 Units into the skin daily. 15 mL 3   Insulin Pen Needle (DROPLET PEN NEEDLES) 31G X 5 MM MISC USE ONE NEEDLE  SUBCUTANEOUSLY AS DIRECTED (REMOVE AND DISCARD NEEDLE IN SHARPS CONTAINER IMMEDIATELY AFTER USE) 100 each 1   Multiple Vitamins-Minerals (CENTRUM SILVER 50+WOMEN PO) Take 1 tablet by mouth daily.     nitroGLYCERIN (NITROSTAT) 0.4 MG SL tablet Place 1 tablet (0.4 mg total) under the tongue every 5 (five) minutes as needed for chest pain. 25 tablet 1   nystatin (MYCOSTATIN/NYSTOP) powder Apply 1 g topically 4 (four) times daily as needed (rash).     ondansetron (ZOFRAN-ODT) 4 MG disintegrating tablet Take 1 tablet (4 mg total) by mouth every 8 (eight) hours as needed for nausea or vomiting. 30 tablet 0   OVER THE COUNTER MEDICATION Apply 1 application topically daily as needed (pain). [place] Gel topical pain reliever     pantoprazole (PROTONIX) 40 MG tablet TAKE 1 TABLET BY MOUTH EVERY DAY 30 tablet 2   pregabalin (LYRICA) 50 MG capsule TAKE 1 CAPSULE BY MOUTH 2 TIMES DAILY. 60 capsule 2   ranolazine (RANEXA) 500 MG 12 hr tablet Take 1 tablet (500 mg total) by mouth 2 (two) times daily. 180 tablet 0   ranolazine (RANEXA) 500 MG 12 hr tablet Take 1 tablet (500 mg total) by mouth 2 (two) times daily. 60 tablet 0   sertraline (ZOLOFT) 25 MG tablet Take 1 tablet (25 mg total) by mouth daily. 90 tablet 1   spironolactone (ALDACTONE) 25 MG tablet Take 0.5 tablets (12.5 mg total) by mouth daily. 45 tablet 3   No facility-administered medications prior to visit.    Review of Systems;  Patient denies headache, fevers, malaise, unintentional weight loss, skin rash, eye pain, sinus congestion and sinus pain, sore throat, dysphagia,  hemoptysis , cough, dyspnea, wheezing, chest pain, palpitations, orthopnea, edema, abdominal pain, nausea, melena, diarrhea, constipation, flank pain, dysuria, hematuria, urinary  Frequency, nocturia, numbness, tingling, seizures,  Focal weakness, Loss of consciousness,  Tremor, insomnia, depression, anxiety, and suicidal ideation.      Objective:  BP 118/62 (BP Location: Left  Arm, Patient Position: Sitting, Cuff Size: Normal)   Pulse 84   Temp 98.1 F (36.7 C) (Oral)   Ht 5' 4.5" (1.638 m)   Wt 235 lb (106.6 kg)   SpO2 98%   BMI 39.71 kg/m   BP Readings from Last 3 Encounters:  02/06/22 118/62  02/01/22 118/72  01/11/22 112/62    Wt Readings from Last 3 Encounters:  02/06/22 235 lb (106.6 kg)  02/01/22 233 lb 12.8 oz (106.1 kg)  01/11/22 232 lb (105.2 kg)    General appearance: alert, cooperative and appears stated age Ears: normal TM's and external ear canals both ears Throat: lips, mucosa, and tongue normal;  teeth and gums normal Neck: no adenopathy, no carotid bruit, supple, symmetrical, trachea midline and thyroid not enlarged, symmetric, no tenderness/mass/nodules Back: symmetric, no curvature. ROM normal. No CVA tenderness. Lungs: clear to auscultation bilaterally Heart: regular rate and rhythm, S1, S2 normal, no murmur, click, rub or gallop Abdomen: soft, non-tender; bowel sounds normal; no masses,  no organomegaly Pulses: 2+ and symmetric Skin: Skin color, texture, turgor normal. No rashes or lesions Lymph nodes: Cervical, supraclavicular, and axillary nodes normal. Psych: affect normal, makes good eye contact. No fidgeting,  does not appear anxious.  Denies suicidal thoughts :  Lab Results  Component Value Date   HGBA1C 7.4 (H) 11/21/2021   HGBA1C 7.8 (H) 08/10/2021   HGBA1C 8.9 (H) 02/08/2021    Lab Results  Component Value Date   CREATININE 1.11 (H) 01/05/2022   CREATININE 0.92 11/16/2021   CREATININE 1.21 (H) 09/05/2021    Lab Results  Component Value Date   WBC 11.3 (H) 01/05/2022   HGB 12.8 01/05/2022   HCT 39.6 01/05/2022   PLT 334 01/05/2022   GLUCOSE 119 (H) 01/05/2022   CHOL 135 11/16/2021   TRIG 81 11/16/2021   HDL 44 11/16/2021   LDLDIRECT 111.0 06/16/2015   LDLCALC 75 11/16/2021   ALT 11 11/16/2021   AST 16 11/16/2021   NA 141 01/05/2022   K 2.8 (L) 01/05/2022   CL 103 01/05/2022   CREATININE 1.11 (H)  01/05/2022   BUN 11 01/05/2022   CO2 28 01/05/2022   TSH 2.209 02/08/2021   INR 1.1 12/31/2019   HGBA1C 7.4 (H) 11/21/2021   MICROALBUR <0.7 11/21/2021    NM Myocar Multi W/Spect W/Wall Motion / EF  Result Date: 01/18/2022 Pharmacological myocardial perfusion imaging study with no significant  ischemia Normal wall motion, EF estimated at 95% No EKG changes concerning for ischemia at peak stress or in recovery. CT attenuation correction images with coronary calcification in the LAD Low risk scan Signed, Esmond Plants, MD, Ph.D [facility]    Assessment & Plan:   Problem List Items Addressed This Visit     Type 2 diabetes mellitus with diabetic neuropathy, unspecified ([place])    Poor glycemic control due to dietary non adherence and medical non adherence.  Instruction given.  Rare episodes of hypoglycemia due to using novolog inappropriately.  Hyperglycemia due to diet and improper use of sliding scale.  increase Tresiba to 20 units . contiue [place].       CKD stage 3 due to type 2 diabetes mellitus ([place]) - Primary   Hx of amaurosis fugax    She reports  having recurrent episodes of binocular vision loss lasting 30 minutes  Or longer  Occurring only in the morning upon waking.  She has chronic microvascular ischemic disesae by July 2022 CT head.   unlikely to be  arteritis,  Given the bilateral involvement and episodic nature. Consider migraine aura.  Carotid artery stenosis.   Ophthalmology referral needed.       Relevant Orders   Sedimentation rate   C-reactive protein   US Carotid Duplex Bilateral   Ambulatory referral to Ophthalmology   RESOLVED: Type 2 diabetes mellitus with left diabetic foot ulcer ([place])    Managed with  tresiba 10 units Farxiga 10 mg .  She is unable to provide details of her elevated readings and has been asked to submit data fro her CBG monitor for review      Relevant Orders   Hemoglobin A1c   Comprehensive metabolic panel  Lipid panel    I spent a  total of   minutes with this patient in a face to face visit on the date of this encounter reviewing the last office visit with me on        ,  most recent with patient's cardiologist in    ,  patient'ss diet and eating habits, home blood pressure readings ,  most recent imaging study ,   and post visit ordering of testing and therapeutics.    Follow-up: Return in about 3 months (around 05/09/2022).   [name], MD

## 2022-02-06 NOTE — Assessment & Plan Note (Addendum)
She reports  having recurrent episodes of binocular vision loss lasting 30 minutes  Or longer  Occurring only in the morning upon waking.  She has chronic microvascular ischemic disesae by July 2022 CT head.   unlikely to be  arteritis,  Given the bilateral involvement and episodic nature. Consider migraine aura.  Carotid artery stenosis.   Ophthalmology referral needed.

## 2022-02-06 NOTE — Telephone Encounter (Signed)
Please schedule Denise Sharp a lab visit ASAP  ,  and let her know I have ordered an ophthalomology evaluation and carotid dopplers because of the episodes of vision loss she reported yesterday

## 2022-02-06 NOTE — Assessment & Plan Note (Signed)
Managed with  tresiba 10 units Farxiga 10 mg .  She is unable to provide details of her elevated readings and has been asked to submit data fro her CBG monitor for review

## 2022-02-06 NOTE — Progress Notes (Unsigned)
PATIENT: Denise Sharp DOB: 02/13/1946  REASON FOR VISIT: Multiple Complaints  HISTORY FROM: Patient, husband PRIMARY NEUROLOGIST: Dr. Krista Blue   HISTORY OF PRESENT ILLNESS: Today 02/07/22 [name] is here today to discuss imaging. Has known spinal disease. Had known bladder interstim that was MRI incompatible, earlier this year, new device implanted that is MRI compatible. Continues with chronic neck pain, occipital headaches, cervicogenic headache. Limited ROM to her neck.   DM is under poor control, passed out this week while shopping, her glucose was 55, started with blurry vision, has trouble processing, was standing at the buggy, was shaking all over, she was confused but could talk, she then passed out, got her some orange juice. Was tired and sleepy after. She took her insulin late after eating, which is why her sugar dropped.  Is on insulin, Tresiba, Farxiga.  Some morning right when she wakes up vision is black, lays down, then sometimes black circles with red rings, does have mild headache daily, going on several months, no other migraine features. When she goes to movies, her eyes don't adjust. History of cataract surgery, has been referred to ophthmaology. Zoloft was stopped to see if contributing.   Sugars are labile can go from 50-400 quickly. Poor hearing, wears hearing aids. Can have frequent falls.   Update 09/12/21 SS: Denise Sharp is here today for follow-up. Doing okay, 1 fall since August, her blood sugar dropped. Uses walker. Recent A1C 7.8. She doesn't drive often. She lives with her husband. No longer having post-herpetic neuralgia. Is taking lyrica 50 mg twice daily.  Has cramping of feet up the legs. Right now is controlled. Hits her at different times. On Monday, having interstim implant for bladder replaced with MRI compatible. Has chronic neck pain and headaches. Injury of C4-6 in past. Claims can go asleep and then wake up not able to move, will have to take  anti-inflammatory medications, wait for her spine "to get back in place". Her legs are getting weaker overtime. Has frequent headache, neck pain. Limited ROM of neck. Wants to move her care to [facility] area where her children are located. Wants MRI brain, cervical spine once stimulator is replaced.   HISTORY  03/09/21 Dr. Jannifer Franklin: Denise Sharp is a 76 year old right-handed white female with a history of diabetes, obesity, and a significant diabetic peripheral neuropathy associated with a gait disorder.  She has had issues with episodic falls.  The patient is now using a walker religiously when she ambulates.  The patient has some assistance in the home environment.  She still is operating motor vehicle and she believes that she can do this safely, but her husband usually is the one who drives.  She has noted onset of a right V1 distribution shingles outbreak, she is having a lot of discomfort with this currently.  She is on Lyrica but is only taking 50 mg daily.  She is having difficulty sleeping because of the pain.  She has not fallen recently with a walker.  She comes here for further evaluation.  REVIEW OF SYSTEMS: Out of a complete 14 system review of symptoms, the patient complains only of the following symptoms, and all other reviewed systems are negative.  See HPI  ALLERGIES: Allergies  Allergen Reactions   Demeclocycline Hives   Erythromycin Nausea And Vomiting and Other (See Comments)    Severe irritable bowel   Flagyl [Metronidazole] Nausea And Vomiting and Other (See Comments)    Severe irritable bowel   Glucophage [  Metformin Hcl] Nausea And Vomiting and Other (See Comments)    "Sick" "I won't take anything that has metformin in it"   Tetracyclines & Related Hives and Rash   Diovan [Valsartan] Nausea Only        Sulfa Antibiotics Rash and Other (See Comments)    As child   Xanax [Alprazolam] Other (See Comments)    Unknown reaction    HOME MEDICATIONS: Outpatient Medications  Prior to Visit  Medication Sig Dispense Refill   albuterol (VENTOLIN HFA) 108 (90 Base) MCG/ACT inhaler Inhale 1-2 puffs into the lungs every 6 (six) hours as needed for wheezing or shortness of breath. 18 g 2   aspirin EC 81 MG tablet Take 81 mg by mouth daily. Swallow whole.     atorvastatin (LIPITOR) 40 MG tablet Take 2 tablets (80 mg total) by mouth daily. 180 tablet 0   clotrimazole-betamethasone (LOTRISONE) cream Apply 1 application topically 2 (two) times daily as needed (irritation).     Continuous Blood Gluc Receiver (FREESTYLE LIBRE 2 READER) DEVI Use to check sugar at least TID 1 each 1   Continuous Blood Gluc Sensor (FREESTYLE LIBRE 2 SENSOR) MISC 1 application by Does not apply route every 14 (fourteen) days. 2 each 0   cyanocobalamin (,VITAMIN B-12,) 1000 MCG/ML injection Inject 1 mL (1,000 mcg total) into the muscle every 30 (thirty) days. Inject 1 ml (1000 mcg ) IM weekly x 4,  Then monthly thereafter 3 mL 1   dapagliflozin propanediol (FARXIGA) 10 MG TABS tablet Take 1 tablet (10 mg total) by mouth daily. 30 tablet 0   dicyclomine (BENTYL) 20 MG tablet Take 1 tablet (20 mg total) by mouth 3 (three) times daily before meals. 90 tablet 1   diphenoxylate-atropine (LOMOTIL) 2.5-0.025 MG tablet Take 1 tablet by mouth 4 (four) times daily as needed for diarrhea or loose stools. 60 tablet 5   fexofenadine (ALLEGRA) 180 MG tablet Take 180 mg by mouth as needed for allergies or rhinitis.     HYDROcodone-acetaminophen (NORCO/VICODIN) 5-325 MG tablet Take 1-2 tablets by mouth every 6 (six) hours as needed for moderate pain. 14 tablet 0   insulin aspart (NOVOLOG FLEXPEN) 100 UNIT/ML FlexPen Inject 4-16 units with meals per sliding scale: If pre-meal glucose <100, inject 4 units; 100-150 - inject 6 units, 151-200 - inject 8 units; 201-250 - inject 10 units; 251-300 - inject 12 units, 301-350 - inject 14 units ; 350 > inject 16 units 15 mL 3   insulin degludec (TRESIBA FLEXTOUCH) 100 UNIT/ML  FlexTouch Pen Inject 8 Units into the skin daily. 15 mL 3   Insulin Pen Needle (DROPLET PEN NEEDLES) 31G X 5 MM MISC USE ONE NEEDLE SUBCUTANEOUSLY AS DIRECTED (REMOVE AND DISCARD NEEDLE IN SHARPS CONTAINER IMMEDIATELY AFTER USE) 100 each 1   Multiple Vitamins-Minerals (CENTRUM SILVER 50+WOMEN PO) Take 1 tablet by mouth daily.     nitroGLYCERIN (NITROSTAT) 0.4 MG SL tablet Place 1 tablet (0.4 mg total) under the tongue every 5 (five) minutes as needed for chest pain. 25 tablet 1   nystatin (MYCOSTATIN/NYSTOP) powder Apply 1 g topically 4 (four) times daily as needed (rash).     ondansetron (ZOFRAN-ODT) 4 MG disintegrating tablet Take 1 tablet (4 mg total) by mouth every 8 (eight) hours as needed for nausea or vomiting. 30 tablet 0   OVER THE COUNTER MEDICATION Apply 1 application topically daily as needed (pain). [place] Gel topical pain reliever     pantoprazole (PROTONIX) 40 MG tablet TAKE 1  TABLET BY MOUTH EVERY DAY 30 tablet 2   pregabalin (LYRICA) 50 MG capsule TAKE 1 CAPSULE BY MOUTH 2 TIMES DAILY. 60 capsule 2   ranolazine (RANEXA) 500 MG 12 hr tablet Take 1 tablet (500 mg total) by mouth 2 (two) times daily. 180 tablet 0   sertraline (ZOLOFT) 25 MG tablet Take 1 tablet (25 mg total) by mouth daily. 90 tablet 1   spironolactone (ALDACTONE) 25 MG tablet Take 0.5 tablets (12.5 mg total) by mouth daily. 45 tablet 3   ranolazine (RANEXA) 500 MG 12 hr tablet Take 1 tablet (500 mg total) by mouth 2 (two) times daily. 60 tablet 0   No facility-administered medications prior to visit.    PAST MEDICAL HISTORY: Past Medical History:  Diagnosis Date   (HFpEF) heart failure with preserved ejection fraction ([place])    a. 12/2019 Echo: EF 65-70%, Gr2 DD. No significant valvular dzs.   Abnormality of gait 03/25/2013   Adrenal mass, left (HCC)    Anginal pain (HCC)    Anxiety    Aortic atherosclerosis (HCC)    Arthritis    Asthma    CAD (coronary artery disease) with h/o Atypical Chest Pain    a.  04/1986 Cath ([facility]): nl cors, EF 65%; b. 07/2012 Cath [facility]): Diff minor irregs; c. 07/2016 MV [facility]): "Equivocal"; d. 08/2016 Cardiac CT Ca2+ score [facility]): Ca2+ 1610; e. 05/2019 MV: No ischemia. EF 75%; f. 12/2019 PCI: LM nl, LAD 65ost (3.5x12 Resolute DES), 29m(2.75x12 Resolute DES),LCX/RCA nl; g. 08/2020 Cath: patent LAD stents, RCA 40ost, elev RH pressures, EF >65%.   Cervical spinal stenosis 1994   due to trauma to back ([employer] accident), has intermittent paralysis and parasthesias   Cervicogenic headache 03/23/2014   CKD (chronic kidney disease), stage III (HCC)    Depression    Diverticulosis    Dizziness    a.) chronic   DJD (degenerative joint disease)    a. Chronic R shoulder pain   Dyspnea    Esophageal stenosis 09/2009   a.) transient outlet obstruction by food, cleared by EGD   Family history of adverse reaction to anesthesia    a.) daughter with (+) PONV   Gastric bypass status for obesity    GERD (gastroesophageal reflux disease)    Headache(784.0)    HLD (hyperlipidemia)    Hypertension    IBS (irritable bowel syndrome)    IDA (iron deficiency anemia)    a.) post 2 unit txfsn 2009, normal endo/colonoscopy by [facility]   Left bundle branch block (LBBB)    a.) Intermittently present - likely rate related.   NSVT (nonsustained ventricular tachycardia) ([place] 11/15/2020   a.) Holter study 11/15/2020; 11 episodes of NSVT lasting up to 8 beats with a maximum rate of 210 bpm; 14 atrial runs lasting up to 18 beats with a rate of up to 250 bpm.  Some atrial runs felt to be NSVT.   NSVT (nonsustained ventricular tachycardia) ([facility] [place]    a. 10/2020 [facility]: 11 runs of NSVT up to 8 beats.   Obesity    OSA on CPAP    Polyneuropathy in diabetes(357.2) 03/25/2013   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/09/2021   Right V1 distribution   PSVT (paroxysmal supraventricular tachycardia) ([facility]    a. 10/2020 [facility]: 14 atrial runs up to 18 beats, max HR 250.   Restless leg syndrome    Rotator cuff arthropathy, right  08/13/2017   Syncope and collapse 03/12/2014   Type II diabetes mellitus ([place]  PAST SURGICAL HISTORY: Past Surgical History:  Procedure Laterality Date   APPENDECTOMY     BACK SURGERY     CHOLECYSTECTOMY N/A 1976   COLONOSCOPY     CORONARY ANGIOPLASTY     CORONARY STENT INTERVENTION N/A 01/04/2020   Procedure: CORONARY STENT INTERVENTION;  Surgeon: [name], MD;  Location: [place] [facility];  Service: Cardiovascular;  Laterality: N/A;  LAD    DIAPHRAGMATIC HERNIA REPAIR  2015   ESOPHAGEAL DILATION     multiple   ESOPHAGOGASTRODUODENOSCOPY (EGD) WITH PROPOFOL N/A 05/11/2021   Procedure: ESOPHAGOGASTRODUODENOSCOPY (EGD) WITH PROPOFOL;  Surgeon: [name], MD;  Location: [facility];  Service: Endoscopy;  Laterality: N/A;   ESOPHAGOGASTRODUODENOSCOPY (EGD) WITH PROPOFOL N/A 06/13/2021   Procedure: ESOPHAGOGASTRODUODENOSCOPY (EGD) WITH PROPOFOL;  Surgeon: [name], MD;  Location: [facility];  Service: Endoscopy;  Laterality: N/A;   EYE SURGERY     GASTRIC BYPASS  2000, 2005   Dr. [name] IMPLANT PLACEMENT  10/2011   Hollice Gong IMPLANT PLACEMENT N/A 09/18/2021   Procedure: Barrie Lyme IMPLANT FIRST STAGE;  Surgeon: Bjorn Loser, MD;  Location: [facility];  Service: Urology;  Laterality: N/A;   INTERSTIM IMPLANT PLACEMENT N/A 09/18/2021   Procedure: Barrie Lyme IMPLANT SECOND STAGE WITH IMPEDENCE CHECK;  Surgeon: Bjorn Loser, MD;  Location: [facility];  Service: Urology;  Laterality: N/A;   INTERSTIM IMPLANT REMOVAL N/A 09/18/2021   Procedure: REMOVAL OF INTERSTIM IMPLANT;  Surgeon: Bjorn Loser, MD;  Location: [facility];  Service: Urology;  Laterality: N/A;   LEFT HEART CATH AND CORONARY ANGIOGRAPHY N/A 04/1986   LEFT HEART CATH AND CORONARY ANGIOGRAPHY Left 07/24/2012   LEFT HEART CATH AND CORS/GRAFTS ANGIOGRAPHY N/A 12/31/2019   Procedure: LEFT HEART CATH AND CORS/GRAFTS ANGIOGRAPHY;  Surgeon: [name], MD;  Location:  [place] [facility];  Service: Cardiovascular;  Laterality: N/A;   PANNICULECTOMY N/A 06/16/2019   Procedure: PANNICULECTOMY;  Surgeon: [name], MD;  Location: [place];  Service: Plastics;  Laterality: N/A;  3 hours, please   REVERSE SHOULDER ARTHROPLASTY Right 08/13/2017   Procedure: REVERSE RIGHT SHOULDER ARTHROPLASTY;  Surgeon: [name], MD;  Location: [place];  Service: Orthopedics;  Laterality: Right;   RIGHT/LEFT HEART CATH AND CORONARY ANGIOGRAPHY N/A 09/06/2020   Procedure: RIGHT/LEFT HEART CATH AND CORONARY ANGIOGRAPHY;  Surgeon: [name], MD;  Location: [place] [facility];  Service: Cardiovascular;  Laterality: N/A;   ROTATOR CUFF REPAIR Right    SPINE SURGERY  1995   [place]   TOOTH EXTRACTION  11/13/2021   TOTAL ABDOMINAL HYSTERECTOMY W/ BILATERAL SALPINGOOPHORECTOMY  1974   TOTAL KNEE ARTHROPLASTY Bilateral 2007   Procedure: TOTAL KNEE ARTHROPLASTY; Surgeons: Mauri Pole, MD and [name], MD   UMBILICAL HERNIA REPAIR  08/11/2015    FAMILY HISTORY: Family History  Problem Relation Age of Onset   Heart disease Father    Hypertension Father    Prostate cancer Father    Stroke Father    Osteoporosis Father    Stroke Mother    Depression Mother    Headache Mother    Heart disease Mother    Thyroid disease Mother    Hypertension Mother    Diabetes Daughter    Heart disease Daughter    Hypertension Daughter    Hypertension Son     SOCIAL HISTORY: Social History   Socioeconomic History   Marital status: Married    Spouse name: [name]    Number of children: 2   Years of  education: College    Highest education level: Not on file  Occupational History    Employer: retired  Tobacco Use   Smoking status: Never    Passive exposure: Never   Smokeless tobacco: Never  Vaping Use   Vaping Use: Never used  Substance and Sexual Activity   Alcohol use: No   Drug use: Not Currently    Comment: prescribed valium   Sexual activity: Not Currently    Birth  control/protection: Post-menopausal, Surgical  Other Topics Concern   Not on file  Social History Narrative   Patient lives at home with husband [name].    Right Handed   Drinks caffeinated tea occasionally   Social Determinants of Health   Financial Resource Strain: Low Risk  (09/15/2021)   Overall Financial Resource Strain (CARDIA)    Difficulty of Paying Living Expenses: Not hard at all  Food Insecurity: No Food Insecurity (10/19/2020)   Hunger Vital Sign    Worried About Running Out of Food in the Last Year: Never true    Ran Out of Food in the Last Year: Never true  Transportation Needs: No Transportation Needs (10/19/2020)   PRAPARE - Hydrologist (Medical): No    Lack of Transportation (Non-Medical): No  Physical Activity: Insufficiently Active (11/15/2020)   Exercise Vital Sign    Days of Exercise per Week: 2 days    Minutes of Exercise per Session: 40 min  Stress: Stress Concern Present (08/03/2020)   [place]    Feeling of Stress : Rather much  Social Connections: Not on file  Intimate Partner Violence: Not At Risk (10/19/2020)   Humiliation, Afraid, Rape, and Kick questionnaire    Fear of Current or Ex-Partner: No    Emotionally Abused: No    Physically Abused: No    Sexually Abused: No   PHYSICAL EXAM  Vitals:   02/07/22 1355  BP: 124/72  Pulse: 71  Weight: 235 lb (106.6 kg)  Height: '5\' 4"'  (1.626 m)   Body mass index is 40.34 kg/m.  Generalized: Well developed, in no acute distress   Neurological examination  Mentation: Alert oriented to time, place, history taking. Follows all commands speech and language fluent Cranial nerve II-XII: Pupils were equal round reactive to light. Extraocular movements were full, visual field were full on confrontational test. Facial sensation and strength were normal. Head turning and shoulder shrug  were normal and symmetric. Limited ROM of  cervical spine to about 60 degree limitation is more towards the left  Motor: Overall good muscle strength throughout Sensory: Length dependent sensory deficit to soft touch to knees Coordination: Cerebellar testing reveals good finger-nose-finger and heel-to-shin bilaterally.  Gait and station: Gait is shuffling type, short steps, unsteady, , uses walker in the hall Reflexes: Deep tendon reflexes are symmetric and normal bilaterally.   DIAGNOSTIC DATA (LABS, IMAGING, TESTING) - I reviewed patient records, labs, notes, testing and imaging myself where available.  Lab Results  Component Value Date   WBC 11.3 (H) 01/05/2022   HGB 12.8 01/05/2022   HCT 39.6 01/05/2022   MCV 91.9 01/05/2022   PLT 334 01/05/2022      Component Value Date/Time   NA 141 01/05/2022 1446   NA 145 (H) 05/12/2021 1126   NA 141 06/21/2014 1349   K 2.8 (L) 01/05/2022 1446   K 4.0 06/21/2014 1349   CL 103 01/05/2022 1446   CL 106 06/21/2014 1349  CO2 28 01/05/2022 1446   CO2 24 06/21/2014 1349   GLUCOSE 119 (H) 01/05/2022 1446   GLUCOSE 174 (H) 06/21/2014 1349   BUN 11 01/05/2022 1446   BUN 13 05/12/2021 1126   BUN 12 06/21/2014 1349   CREATININE 1.11 (H) 01/05/2022 1446   CREATININE 0.88 03/06/2019 1624   CALCIUM 7.9 (L) 01/05/2022 1446   CALCIUM 8.7 06/21/2014 1349   PROT 6.6 11/16/2021 1203   PROT 7.3 10/30/2012 1436   ALBUMIN 2.9 (L) 11/16/2021 1203   ALBUMIN 3.0 (L) 10/30/2012 1436   AST 16 11/16/2021 1203   AST 16 10/30/2012 1436   ALT 11 11/16/2021 1203   ALT 19 10/30/2012 1436   ALKPHOS 150 (H) 11/16/2021 1203   ALKPHOS 125 10/30/2012 1436   BILITOT 0.6 11/16/2021 1203   BILITOT 0.2 10/30/2012 1436   GFRNONAA 52 (L) 01/05/2022 1446   GFRNONAA 57 (L) 06/21/2014 1349   GFRNONAA 55 (L) 02/25/2014 2153   GFRNONAA 70 12/13/2011 0919   GFRAA >60 03/23/2020 1204   GFRAA >60 06/21/2014 1349   GFRAA >60 02/25/2014 2153   GFRAA 80 12/13/2011 0919   Lab Results  Component Value Date    CHOL 135 11/16/2021   HDL 44 11/16/2021   LDLCALC 75 11/16/2021   LDLDIRECT 111.0 06/16/2015   TRIG 81 11/16/2021   CHOLHDL 3.1 11/16/2021   Lab Results  Component Value Date   HGBA1C 7.4 (H) 11/21/2021   Lab Results  Component Value Date   VITAMINB12 973 (H) 01/05/2022   Lab Results  Component Value Date   TSH 2.209 02/08/2021    ASSESSMENT AND PLAN 76 y.o. year old female   1.  Diabetic peripheral neuropathy 2.  Gait disorder 3.  Chronic neck pain, cervicogenic headache  4.  Syncope, in the setting of hypoglycemia 5.  Type 2 diabetes, under poor control 6.  Episodes of morning visual darkening  -Above symptoms in the setting of poor glycemic control, dietary non-adherence -Check MRI of the brain, cervical spine, had bladder InterStim replaced, now reportedly MRI compatible, for years has been unable to have MRI -Check EEG, recent syncope sounds as if related to hypoglycemia, husband describes seizure like activity -Pending referral to ophthalmology for visual loss episodes, could be migrainous, but no other migraine features, unclear etiology; does have OSA on CPAP nightly -ESR, CRP drawn yesterday at PCP -Has seen cardiology, episodes of darkening, potentially related to hypotension, carotid US showed minimal stenosis -Follow-up in 4 months with Dr. Krista Blue to establish care  Butler Denmark, Laqueta Jean, [facility] [address] [place], [place] [address] [phone]

## 2022-02-06 NOTE — Patient Instructions (Addendum)
Increase the TRESIBA to 20 units starting today.   If you are drinking sweet tea with any meal,  you will need to add 3 units of Novolog to your current sliding scale.  Do NOT take novolog if you are not eating .  Do NOT take it after you have eaten.  YOU WILL BOTTOM OUT.   You can suspend the sertraline to see if the visual changes stop.  If they do not,  please check your blood sugar and your blood pressure when they occur  Return for labs on or after august 9    I no longer write letters for emotional support animals.  If you feel you need this I will be happy to refer you to a psychiatrist

## 2022-02-06 NOTE — Telephone Encounter (Signed)
Denise Sharp would like to complete a visit with the patient.   She declined a mychart appt. She would rather come into the office. She has been scheduled on 02/07/22.

## 2022-02-07 ENCOUNTER — Ambulatory Visit (INDEPENDENT_AMBULATORY_CARE_PROVIDER_SITE_OTHER): Payer: Medicare Other | Admitting: Neurology

## 2022-02-07 ENCOUNTER — Telehealth: Payer: Self-pay | Admitting: Internal Medicine

## 2022-02-07 ENCOUNTER — Encounter: Payer: Self-pay | Admitting: Neurology

## 2022-02-07 VITALS — BP 124/72 | HR 71 | Ht 64.0 in | Wt 235.0 lb

## 2022-02-07 DIAGNOSIS — M542 Cervicalgia: Secondary | ICD-10-CM | POA: Diagnosis not present

## 2022-02-07 DIAGNOSIS — G4486 Cervicogenic headache: Secondary | ICD-10-CM | POA: Diagnosis not present

## 2022-02-07 DIAGNOSIS — G8929 Other chronic pain: Secondary | ICD-10-CM | POA: Diagnosis not present

## 2022-02-07 DIAGNOSIS — E1142 Type 2 diabetes mellitus with diabetic polyneuropathy: Secondary | ICD-10-CM | POA: Diagnosis not present

## 2022-02-07 DIAGNOSIS — R269 Unspecified abnormalities of gait and mobility: Secondary | ICD-10-CM | POA: Diagnosis not present

## 2022-02-07 DIAGNOSIS — R55 Syncope and collapse: Secondary | ICD-10-CM | POA: Diagnosis not present

## 2022-02-07 NOTE — Telephone Encounter (Signed)
Pt c/o medication issue:  1. Name of Medication: imdur   2. How are you currently taking this medication (dosage and times per day)? Not sure  3. Are you having a reaction (difficulty breathing--STAT)? no  4. What is your medication issue? Patient states she was told to stop taking imdur and start another medication. She says she has looked through her medication bottles and does not see anything with that name. She would like to verify if it has another name.

## 2022-02-07 NOTE — Telephone Encounter (Signed)
Spoke with Denise Sharp to let her know that the order has been canceled. Denise Sharp stated to let you know that she just left the Neurology office and she has been scheduled for an EEG on 02/19/2022.

## 2022-02-07 NOTE — Patient Instructions (Signed)
Orders Placed This Encounter  Procedures   MR BRAIN WO CONTRAST   MR CERVICAL SPINE WO CONTRAST   EEG adult

## 2022-02-07 NOTE — Addendum Note (Signed)
Addended by: [name] on: 02/07/2022 12:40 PM   Modules accepted: Orders

## 2022-02-07 NOTE — Telephone Encounter (Signed)
Called patient back and informed her that Isosorbide is the other name of the medication. Patient was able to find it and was grateful for the call back.

## 2022-02-07 NOTE — Telephone Encounter (Signed)
Spoke with pt and scheduled the lab appt. Pt is wanting to know if the carotid doppler is the same thing as the carotid US because she had that done on Friday.

## 2022-02-08 ENCOUNTER — Telehealth: Payer: Self-pay | Admitting: Neurology

## 2022-02-08 NOTE — Telephone Encounter (Signed)
medicare/[employer] sup NPR sent to [facility]

## 2022-02-09 ENCOUNTER — Telehealth: Payer: Self-pay | Admitting: Internal Medicine

## 2022-02-09 ENCOUNTER — Other Ambulatory Visit (INDEPENDENT_AMBULATORY_CARE_PROVIDER_SITE_OTHER): Payer: Medicare Other

## 2022-02-09 DIAGNOSIS — Z8669 Personal history of other diseases of the nervous system and sense organs: Secondary | ICD-10-CM

## 2022-02-09 DIAGNOSIS — E11621 Type 2 diabetes mellitus with foot ulcer: Secondary | ICD-10-CM

## 2022-02-09 DIAGNOSIS — L97529 Non-pressure chronic ulcer of other part of left foot with unspecified severity: Secondary | ICD-10-CM

## 2022-02-09 LAB — SEDIMENTATION RATE: Sed Rate: 60 mm/hr — ABNORMAL HIGH (ref 0–30)

## 2022-02-09 LAB — COMPREHENSIVE METABOLIC PANEL
ALT: 11 U/L (ref 0–35)
AST: 13 U/L (ref 0–37)
Albumin: 3.5 g/dL (ref 3.5–5.2)
Alkaline Phosphatase: 132 U/L — ABNORMAL HIGH (ref 39–117)
BUN: 11 mg/dL (ref 6–23)
CO2: 24 mEq/L (ref 19–32)
Calcium: 8.7 mg/dL (ref 8.4–10.5)
Chloride: 107 mEq/L (ref 96–112)
Creatinine, Ser: 1.06 mg/dL (ref 0.40–1.20)
GFR: 51.07 mL/min — ABNORMAL LOW (ref >60.00)
Glucose, Bld: 129 mg/dL — ABNORMAL HIGH (ref 70–99)
Potassium: 3.7 mEq/L (ref 3.5–5.1)
Sodium: 143 mEq/L (ref 135–145)
Total Bilirubin: 0.7 mg/dL (ref 0.2–1.2)
Total Protein: 6.3 g/dL (ref 6.0–8.3)

## 2022-02-09 LAB — LIPID PANEL
Cholesterol: 111 mg/dL (ref 0–200)
HDL: 49.1 mg/dL (ref >39.00)
LDL Cholesterol: 41 mg/dL (ref 0–99)
NonHDL: 61.73
Total CHOL/HDL Ratio: 2
Triglycerides: 103 mg/dL (ref 0.0–149.0)
VLDL: 20.6 mg/dL (ref 0.0–40.0)

## 2022-02-09 LAB — C-REACTIVE PROTEIN: CRP: 1.6 mg/dL (ref 0.5–20.0)

## 2022-02-09 LAB — HEMOGLOBIN A1C: Hgb A1c MFr Bld: 7.7 % — ABNORMAL HIGH (ref 4.6–6.5)

## 2022-02-09 NOTE — Telephone Encounter (Signed)
Is it okay to give pt a sample libre sensor?

## 2022-02-09 NOTE — Telephone Encounter (Addendum)
Pt came into office to drop off a note for the cma and pt asked if we have any libre samples available because she is going out of town. Place in colored folder up front

## 2022-02-12 ENCOUNTER — Ambulatory Visit (INDEPENDENT_AMBULATORY_CARE_PROVIDER_SITE_OTHER): Payer: Medicare Other | Admitting: Urology

## 2022-02-12 ENCOUNTER — Encounter: Payer: Self-pay | Admitting: Urology

## 2022-02-12 ENCOUNTER — Other Ambulatory Visit: Payer: Self-pay

## 2022-02-12 VITALS — BP 127/75 | HR 75 | Ht 64.0 in | Wt 235.0 lb

## 2022-02-12 DIAGNOSIS — I200 Unstable angina: Secondary | ICD-10-CM

## 2022-02-12 DIAGNOSIS — N3946 Mixed incontinence: Secondary | ICD-10-CM | POA: Diagnosis not present

## 2022-02-12 DIAGNOSIS — E1142 Type 2 diabetes mellitus with diabetic polyneuropathy: Secondary | ICD-10-CM

## 2022-02-12 DIAGNOSIS — N200 Calculus of kidney: Secondary | ICD-10-CM

## 2022-02-12 LAB — URINALYSIS, COMPLETE
Bilirubin, UA: NEGATIVE
Ketones, UA: NEGATIVE
Nitrite, UA: POSITIVE — AB
Specific Gravity, UA: 1.015 (ref 1.005–1.030)
Urobilinogen, Ur: 0.2 mg/dL (ref 0.2–1.0)
pH, UA: 5 (ref 5.0–7.5)

## 2022-02-12 LAB — MICROSCOPIC EXAMINATION: WBC, UA: >30 /hpf — AB (ref 0–5)

## 2022-02-12 MED ORDER — ONETOUCH VERIO VI STRP: ORAL_STRIP | 1 refills | Status: DC

## 2022-02-12 MED ORDER — CIPROFLOXACIN HCL 250 MG PO TABS: 250.0000 mg | ORAL_TABLET | Freq: Two times a day (BID) | ORAL | 0 refills | Status: AC

## 2022-02-12 MED ORDER — TRESIBA FLEXTOUCH 100 UNIT/ML ~~LOC~~ SOPN: 20.0000 [IU] | PEN_INJECTOR | Freq: Every day | SUBCUTANEOUS | 3 refills | Status: DC

## 2022-02-12 MED ORDER — DAPAGLIFLOZIN PROPANEDIOL 10 MG PO TABS: 10.0000 mg | ORAL_TABLET | Freq: Every day | ORAL | 0 refills | Status: DC

## 2022-02-12 MED ORDER — DROPLET PEN NEEDLES 31G X 5 MM MISC: 1 refills | Status: DC

## 2022-02-12 NOTE — Telephone Encounter (Signed)
Pt is aware that [employer] sensor has been placed up front for pick up.

## 2022-02-12 NOTE — Telephone Encounter (Signed)
Refilled: 07/19/2021 Last OV: 02/06/2022 Next OV: not scheduled

## 2022-02-12 NOTE — Telephone Encounter (Signed)
LMTCB

## 2022-02-12 NOTE — Progress Notes (Signed)
02/12/2022 9:48 AM   Denise Sharp 1945-10-20 [id number]  Referring provider: [name], MD [place],  [place] [address]  Chief Complaint  Patient presents with   Follow-up   Urinary Incontinence    1 year follow-up    HPI: I placed an InterStim device September 18, 2021.  I had removed an old device.  The case took a long time and actually tried both sides to get finally good responses.  The lead was placed in the same position but in my opinion improved versus the initial InterStim lead.   The patient had presented with high-volume urge incontinence wearing 6-8 pads a day with flooding foot on the floor syndrome in the middle of the night.  She wanted an MRI compatible device as well.  She had been dry on the InterStim years back.   Little to no pain. Incisions look great.  She is completely continent and very happy    Today Excellent bladder control.  In the last 3-week she has had some left-sided pain and she wondered if it could be due to her InterStim.  Clinically not infected  Incisions look great.  No tenderness.  Patient is urine looked infected.  She had a negative CT scan in December 2022.  She had a negative cystoscopy by Dr. Erlene Quan in August 2022.  This is being done for blood in the urine.  She had a positive culture on that day.  She had to be given fosfomycin.  PMH: Past Medical History:  Diagnosis Date   (HFpEF) heart failure with preserved ejection fraction ([place])    a. 12/2019 Echo: EF 65-70%, Gr2 DD. No significant valvular dzs.   Abnormality of gait 03/25/2013   Adrenal mass, left (HCC)    Anginal pain (HCC)    Anxiety    Aortic atherosclerosis (HCC)    Arthritis    Asthma    CAD (coronary artery disease) with h/o Atypical Chest Pain    a. 04/1986 Cath ([facility]): nl cors, EF 65%; b. 07/2012 Cath [facility]): Diff minor irregs; c. 07/2016 MV [facility]): "Equivocal"; d. 08/2016 Cardiac CT Ca2+ score [facility]): Ca2+ 5465; e. 05/2019 MV: No  ischemia. EF 75%; f. 12/2019 PCI: LM nl, LAD 65ost (3.5x12 Resolute DES), 90m(2.75x12 Resolute DES),LCX/RCA nl; g. 08/2020 Cath: patent LAD stents, RCA 40ost, elev RH pressures, EF >65%.   Cervical spinal stenosis 1994   due to trauma to back ([employer] accident), has intermittent paralysis and parasthesias   Cervicogenic headache 03/23/2014   CKD (chronic kidney disease), stage III (HCC)    Depression    Diverticulosis    Dizziness    a.) chronic   DJD (degenerative joint disease)    a. Chronic R shoulder pain   Dyspnea    Esophageal stenosis 09/2009   a.) transient outlet obstruction by food, cleared by EGD   Family history of adverse reaction to anesthesia    a.) daughter with (+) PONV   Gastric bypass status for obesity    GERD (gastroesophageal reflux disease)    Headache(784.0)    HLD (hyperlipidemia)    Hypertension    IBS (irritable bowel syndrome)    IDA (iron deficiency anemia)    a.) post 2 unit txfsn 2009, normal endo/colonoscopy by [facility]   Left bundle branch block (LBBB)    a.) Intermittently present - likely rate related.   NSVT (nonsustained ventricular tachycardia) ([place] 11/15/2020   a.) Holter study 11/15/2020; 11 episodes of NSVT lasting  up to 8 beats with a maximum rate of 210 bpm; 14 atrial runs lasting up to 18 beats with a rate of up to 250 bpm.  Some atrial runs felt to be NSVT.   NSVT (nonsustained ventricular tachycardia) ([place])    a. 10/2020 [facility]: 11 runs of NSVT up to 8 beats.   Obesity    OSA on CPAP    Polyneuropathy in diabetes(357.2) 03/25/2013   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/09/2021   Right V1 distribution   PSVT (paroxysmal supraventricular tachycardia) ([place])    a. 10/2020 [facility]: 14 atrial runs up to 18 beats, max HR 250.   Restless leg syndrome    Rotator cuff arthropathy, right 08/13/2017   Syncope and collapse 03/12/2014   Type II diabetes mellitus ([place])     Surgical History: Past Surgical History:  Procedure Laterality Date   APPENDECTOMY     BACK  SURGERY     CHOLECYSTECTOMY N/A 1976   COLONOSCOPY     CORONARY ANGIOPLASTY     CORONARY STENT INTERVENTION N/A 01/04/2020   Procedure: CORONARY STENT INTERVENTION;  Surgeon: [name], MD;  Location: [facility];  Service: Cardiovascular;  Laterality: N/A;  LAD    DIAPHRAGMATIC HERNIA REPAIR  2015   ESOPHAGEAL DILATION     multiple   ESOPHAGOGASTRODUODENOSCOPY (EGD) WITH PROPOFOL N/A 05/11/2021   Procedure: ESOPHAGOGASTRODUODENOSCOPY (EGD) WITH PROPOFOL;  Surgeon: [name], MD;  Location: [facility];  Service: Endoscopy;  Laterality: N/A;   ESOPHAGOGASTRODUODENOSCOPY (EGD) WITH PROPOFOL N/A 06/13/2021   Procedure: ESOPHAGOGASTRODUODENOSCOPY (EGD) WITH PROPOFOL;  Surgeon: [name], MD;  Location: [facility];  Service: Endoscopy;  Laterality: N/A;   EYE SURGERY     GASTRIC BYPASS  2000, 2005   Dr. [name] IMPLANT PLACEMENT  10/2011   Hollice Gong IMPLANT PLACEMENT N/A 09/18/2021   Procedure: Barrie Lyme IMPLANT FIRST STAGE;  Surgeon: Bjorn Loser, MD;  Location: [facility];  Service: Urology;  Laterality: N/A;   INTERSTIM IMPLANT PLACEMENT N/A 09/18/2021   Procedure: Barrie Lyme IMPLANT SECOND STAGE WITH IMPEDENCE CHECK;  Surgeon: Bjorn Loser, MD;  Location: [facility];  Service: Urology;  Laterality: N/A;   INTERSTIM IMPLANT REMOVAL N/A 09/18/2021   Procedure: REMOVAL OF INTERSTIM IMPLANT;  Surgeon: Bjorn Loser, MD;  Location: [facility];  Service: Urology;  Laterality: N/A;   LEFT HEART CATH AND CORONARY ANGIOGRAPHY N/A 04/1986   LEFT HEART CATH AND CORONARY ANGIOGRAPHY Left 07/24/2012   LEFT HEART CATH AND CORS/GRAFTS ANGIOGRAPHY N/A 12/31/2019   Procedure: LEFT HEART CATH AND CORS/GRAFTS ANGIOGRAPHY;  Surgeon: [name], MD;  Location: [place] [facility];  Service: Cardiovascular;  Laterality: N/A;   PANNICULECTOMY N/A 06/16/2019   Procedure: PANNICULECTOMY;  Surgeon: [name], MD;  Location: [facility];  Service:  Plastics;  Laterality: N/A;  3 hours, please   REVERSE SHOULDER ARTHROPLASTY Right 08/13/2017   Procedure: REVERSE RIGHT SHOULDER ARTHROPLASTY;  Surgeon: [name], MD;  Location: [facility];  Service: Orthopedics;  Laterality: Right;   RIGHT/LEFT HEART CATH AND CORONARY ANGIOGRAPHY N/A 09/06/2020   Procedure: RIGHT/LEFT HEART CATH AND CORONARY ANGIOGRAPHY;  Surgeon: [name], MD;  Location: [facility];  Service: Cardiovascular;  Laterality: N/A;   ROTATOR CUFF REPAIR Right    SPINE SURGERY  1995   [place]   TOOTH EXTRACTION  11/13/2021   TOTAL ABDOMINAL HYSTERECTOMY W/ BILATERAL SALPINGOOPHORECTOMY  1974   TOTAL KNEE ARTHROPLASTY Bilateral 2007   Procedure: TOTAL KNEE ARTHROPLASTY; Surgeons: Mauri Pole, MD and  [name], MD   UMBILICAL HERNIA REPAIR  08/11/2015    Home Medications:  Allergies as of 02/12/2022       Reactions   Demeclocycline Hives   Erythromycin Nausea And Vomiting, Other (See Comments)   Severe irritable bowel   Flagyl [metronidazole] Nausea And Vomiting, Other (See Comments)   Severe irritable bowel   Glucophage [metformin Hcl] Nausea And Vomiting, Other (See Comments)   "Sick" "I won't take anything that has metformin in it"   Tetracyclines & Related Hives, Rash   Diovan [valsartan] Nausea Only       Sulfa Antibiotics Rash, Other (See Comments)   As child   Xanax [alprazolam] Other (See Comments)   Unknown reaction        Medication List        Accurate as of February 12, 2022  9:48 AM. If you have any questions, ask your nurse or doctor.          albuterol 108 (90 Base) MCG/ACT inhaler Commonly known as: VENTOLIN HFA Inhale 1-2 puffs into the lungs every 6 (six) hours as needed for wheezing or shortness of breath.   aspirin EC 81 MG tablet Take 81 mg by mouth daily. Swallow whole.   atorvastatin 40 MG tablet Commonly known as: LIPITOR Take 2 tablets (80 mg total) by mouth daily.   CENTRUM SILVER 50+WOMEN PO Take 1 tablet by mouth  daily.   clotrimazole-betamethasone cream Commonly known as: LOTRISONE Apply 1 application topically 2 (two) times daily as needed (irritation).   cyanocobalamin 1000 MCG/ML injection Commonly known as: VITAMIN B12 Inject 1 mL (1,000 mcg total) into the muscle every 30 (thirty) days. Inject 1 ml (1000 mcg ) IM weekly x 4,  Then monthly thereafter   dapagliflozin propanediol 10 MG Tabs tablet Commonly known as: FARXIGA Take 1 tablet (10 mg total) by mouth daily.   dicyclomine 20 MG tablet Commonly known as: BENTYL Take 1 tablet (20 mg total) by mouth 3 (three) times daily before meals.   diphenoxylate-atropine 2.5-0.025 MG tablet Commonly known as: Lomotil Take 1 tablet by mouth 4 (four) times daily as needed for diarrhea or loose stools.   Droplet Pen Needles 31G X 5 MM Misc Generic drug: Insulin Pen Needle USE ONE NEEDLE SUBCUTANEOUSLY AS DIRECTED (REMOVE AND DISCARD NEEDLE IN SHARPS CONTAINER IMMEDIATELY AFTER USE)   fexofenadine 180 MG tablet Commonly known as: ALLEGRA Take 180 mg by mouth as needed for allergies or rhinitis.   FreeStyle Libre 2 Reader Kerrin Mo Use to check sugar at least TID   FreeStyle Libre 2 Sensor Misc 1 application by Does not apply route every 14 (fourteen) days.   HYDROcodone-acetaminophen 5-325 MG tablet Commonly known as: NORCO/VICODIN Take 1-2 tablets by mouth every 6 (six) hours as needed for moderate pain.   nitroGLYCERIN 0.4 MG SL tablet Commonly known as: NITROSTAT Place 1 tablet (0.4 mg total) under the tongue every 5 (five) minutes as needed for chest pain.   NovoLOG FlexPen 100 UNIT/ML FlexPen Generic drug: insulin aspart Inject 4-16 units with meals per sliding scale: If pre-meal glucose <100, inject 4 units; 100-150 - inject 6 units, 151-200 - inject 8 units; 201-250 - inject 10 units; 251-300 - inject 12 units, 301-350 - inject 14 units ; 350 > inject 16 units   nystatin powder Commonly known as: MYCOSTATIN/NYSTOP Apply 1 g  topically 4 (four) times daily as needed (rash).   ondansetron 4 MG disintegrating tablet Commonly known as: ZOFRAN-ODT Take 1 tablet (4 mg  total) by mouth every 8 (eight) hours as needed for nausea or vomiting.   OVER THE COUNTER MEDICATION Apply 1 application topically daily as needed (pain). [place] Gel topical pain reliever   pantoprazole 40 MG tablet Commonly known as: PROTONIX TAKE 1 TABLET BY MOUTH EVERY DAY   pregabalin 50 MG capsule Commonly known as: LYRICA TAKE 1 CAPSULE BY MOUTH 2 TIMES DAILY.   ranolazine 500 MG 12 hr tablet Commonly known as: Ranexa Take 1 tablet (500 mg total) by mouth 2 (two) times daily.   sertraline 25 MG tablet Commonly known as: ZOLOFT Take 1 tablet (25 mg total) by mouth daily.   spironolactone 25 MG tablet Commonly known as: ALDACTONE Take 0.5 tablets (12.5 mg total) by mouth daily.   Tyler Aas FlexTouch 100 UNIT/ML FlexTouch Pen Generic drug: insulin degludec Inject 8 Units into the skin daily.        Allergies:  Allergies  Allergen Reactions   Demeclocycline Hives   Erythromycin Nausea And Vomiting and Other (See Comments)    Severe irritable bowel   Flagyl [Metronidazole] Nausea And Vomiting and Other (See Comments)    Severe irritable bowel   Glucophage [Metformin Hcl] Nausea And Vomiting and Other (See Comments)    "Sick" "I won't take anything that has metformin in it"   Tetracyclines & Related Hives and Rash   Diovan [Valsartan] Nausea Only        Sulfa Antibiotics Rash and Other (See Comments)    As child   Xanax [Alprazolam] Other (See Comments)    Unknown reaction    Family History: Family History  Problem Relation Age of Onset   Heart disease Father    Hypertension Father    Prostate cancer Father    Stroke Father    Osteoporosis Father    Stroke Mother    Depression Mother    Headache Mother    Heart disease Mother    Thyroid disease Mother    Hypertension Mother    Diabetes Daughter    Heart disease  Daughter    Hypertension Daughter    Hypertension Son     Social History:  reports that she has never smoked. She has never been exposed to tobacco smoke. She has never used smokeless tobacco. She reports that she does not currently use drugs. She reports that she does not drink alcohol.  ROS:                                        Physical Exam: There were no vitals taken for this visit.  Constitutional:  Alert and oriented, No acute distress. HEENT: [place] AT, moist mucus membranes.  Trachea midline, no masses.   Laboratory Data: Lab Results  Component Value Date   WBC 11.3 (H) 01/05/2022   HGB 12.8 01/05/2022   HCT 39.6 01/05/2022   MCV 91.9 01/05/2022   PLT 334 01/05/2022    Lab Results  Component Value Date   CREATININE 1.06 02/09/2022    No results found for: "PSA"  No results found for: "TESTOSTERONE"  Lab Results  Component Value Date   HGBA1C 7.7 (H) 02/09/2022    Urinalysis    Component Value Date/Time   COLORURINE YELLOW 02/17/2021 1008   APPEARANCEUR Clear 08/07/2021 0952   LABSPEC >=1.030 (A) 02/17/2021 1008   LABSPEC 1.006 06/21/2014 1623   PHURINE 5.0 02/17/2021 1008   GLUCOSEU Negative 08/07/2021 7209  GLUCOSEU 500 (A) 02/17/2021 1008   HGBUR NEGATIVE 02/17/2021 1008   BILIRUBINUR Negative 08/07/2021 0952   BILIRUBINUR Negative 06/21/2014 1623   KETONESUR NEGATIVE 02/17/2021 1008   PROTEINUR Trace (A) 08/07/2021 0952   PROTEINUR NEGATIVE 02/08/2021 1323   UROBILINOGEN 0.2 02/17/2021 1008   NITRITE Negative 08/07/2021 0952   NITRITE NEGATIVE 02/17/2021 1008   LEUKOCYTESUR Negative 08/07/2021 0952   LEUKOCYTESUR SMALL (A) 02/17/2021 1008   LEUKOCYTESUR Negative 06/21/2014 1623    Pertinent Imaging:   Assessment & Plan: Patient might have a bladder infection.  Reassurance given about InterStim.  Call if culture positive.  See the patient in 1 year.  Called in ciprofloxacin 250 mg twice a day for 7 days  1. Mixed  incontinence  - Urinalysis, Complete   No follow-ups on file.  Reece Packer, MD  [facility] [address], [place], [place] [address] [phone]

## 2022-02-12 NOTE — Telephone Encounter (Signed)
Patient returned call transferred to [facility].

## 2022-02-13 MED ORDER — TRAMADOL HCL 50 MG PO TABS: 50.0000 mg | ORAL_TABLET | Freq: Every day | ORAL | 0 refills | Status: AC | PRN

## 2022-02-16 LAB — CULTURE, URINE COMPREHENSIVE

## 2022-02-19 ENCOUNTER — Encounter: Payer: Self-pay | Admitting: Internal Medicine

## 2022-02-19 ENCOUNTER — Ambulatory Visit (INDEPENDENT_AMBULATORY_CARE_PROVIDER_SITE_OTHER): Payer: Medicare Other | Admitting: Neurology

## 2022-02-19 DIAGNOSIS — R55 Syncope and collapse: Secondary | ICD-10-CM

## 2022-02-20 ENCOUNTER — Telehealth: Payer: Self-pay | Admitting: Internal Medicine

## 2022-02-20 NOTE — Telephone Encounter (Signed)
Pt called in requesting refill on medication (Continuous Blood Gluc Sensor (FREESTYLE LIBRE 2 SENSOR) MISC) and (glucose blood (ONETOUCH VERIO) test strip)... Pt stated that she needs a script for the strip [facility].... Pt stated that she needs the script for the senors to go to [facility]... Pt state that she sent you a mychart message... Message is below this note:    Pt requesting callback  Please contact me the senor I got from you was bad I was out of town had to buy new one touch [employer] reflect machine for vacation can't get replacement sensor for several days [facility] for [employer] reflect and needle pins to prick finger [facility] needs new rx for them to continue sending my senors each month I have EEG this afternoon at 2 if questions call before that r later afternoon thanks

## 2022-02-21 NOTE — Telephone Encounter (Signed)
See telephone encounter.

## 2022-02-22 ENCOUNTER — Telehealth: Payer: Self-pay | Admitting: Neurology

## 2022-02-22 ENCOUNTER — Other Ambulatory Visit: Payer: Self-pay

## 2022-02-22 MED ORDER — ONETOUCH VERIO VI STRP: ORAL_STRIP | 1 refills | Status: DC

## 2022-02-22 MED ORDER — ONETOUCH DELICA LANCETS 33G MISC: 4 refills | Status: AC

## 2022-02-22 MED ORDER — FREESTYLE LIBRE 2 SENSOR MISC: 1.0000 "application " | 0 refills | Status: DC

## 2022-02-22 NOTE — Telephone Encounter (Signed)
I called and let patient know that I did sent her sensors to [facility] & strips [facility]ets to [facility] for her.

## 2022-02-22 NOTE — Telephone Encounter (Signed)
I called the patient back. She has continued pain and discomfort in her neck. Also, had two days this week of significant stiffness which is some better today.   She was seen in our office 02/07/22. Sarah ordered MRI's of cervical and brain at that appt. She was out of town then had a death in her family. She has not had the tests yet. She would like to move forward with the scans.  The orders were sent to [facility]. I provided her with the number to call to set up the appt [phone]). Once the results are available, she is aware we will contact her.

## 2022-02-22 NOTE — Telephone Encounter (Signed)
Pt started having neck stiffness with pain on Tuesday, 02/20/22. Would like a call from the nurse to discuss what to do.

## 2022-02-23 ENCOUNTER — Other Ambulatory Visit: Payer: Self-pay

## 2022-02-23 MED ORDER — ONETOUCH VERIO VI STRP: ORAL_STRIP | 1 refills | Status: AC

## 2022-02-23 NOTE — Telephone Encounter (Signed)
I called and spoke with pharmacy & they had not received the script I sent yesterday. I have resent & [facility]. Patient aware if this & they they will get ready for her to pick up.

## 2022-02-23 NOTE — Telephone Encounter (Signed)
Pt called stating when she went to go pick up her medication, her test strips were not in there.

## 2022-02-26 DIAGNOSIS — R42 Dizziness and giddiness: Secondary | ICD-10-CM | POA: Diagnosis not present

## 2022-02-26 DIAGNOSIS — H9201 Otalgia, right ear: Secondary | ICD-10-CM | POA: Diagnosis not present

## 2022-02-26 DIAGNOSIS — H903 Sensorineural hearing loss, bilateral: Secondary | ICD-10-CM | POA: Diagnosis not present

## 2022-03-01 ENCOUNTER — Ambulatory Visit
Admission: RE | Admit: 2022-03-01 | Discharge: 2022-03-01 | Disposition: A | Discharge status: Home / Self Care | Payer: Medicare Other | Source: Ambulatory Visit | Attending: Neurology | Admitting: Neurology

## 2022-03-01 DIAGNOSIS — M542 Cervicalgia: Secondary | ICD-10-CM | POA: Diagnosis not present

## 2022-03-01 DIAGNOSIS — R519 Headache, unspecified: Secondary | ICD-10-CM | POA: Diagnosis not present

## 2022-03-01 DIAGNOSIS — G319 Degenerative disease of nervous system, unspecified: Secondary | ICD-10-CM | POA: Diagnosis not present

## 2022-03-01 DIAGNOSIS — G4486 Cervicogenic headache: Secondary | ICD-10-CM

## 2022-03-01 DIAGNOSIS — R413 Other amnesia: Secondary | ICD-10-CM | POA: Diagnosis not present

## 2022-03-06 ENCOUNTER — Telehealth: Payer: Self-pay | Admitting: Neurology

## 2022-03-06 ENCOUNTER — Encounter: Payer: Self-pay | Admitting: Neurology

## 2022-03-06 DIAGNOSIS — G8929 Other chronic pain: Secondary | ICD-10-CM

## 2022-03-06 DIAGNOSIS — M542 Cervicalgia: Secondary | ICD-10-CM

## 2022-03-06 NOTE — Telephone Encounter (Signed)
I called the patient, I left a message, MRI of cervical spine is overall limited by artifact but, at C5-C6 showed severe right and moderate left neural foraminal narrowing.  MRI of the brain showed moderate chronic small vessel ischemic changes, progressed from prior MRI in 2011.  Questionable small chronic lacunar infarct in the left thalamus.  Given MRI cervical spine findings, neck pain, occipital headaches, will refer to neurosurgery for evaluation. She is on aspirin, continue to work with PCP for management of vascular risk factors. I also sent her a my chart message.  IMPRESSION: 1. Evaluation is limited by motion artifact. Within this limitation there is mild spinal canal stenosis at C5-C6 with severe right and moderate left neural foraminal narrowing. 2. C3-C4 borderline spinal canal stenosis with moderate right and mild left neural foraminal narrowing. 3. C4-C5 mild-to-moderate right and moderate left neural foraminal narrowing.  IMPRESSION: No evidence of acute intracranial abnormality.   Chronic small-vessel ischemic changes which are moderate in the cerebral white matter, and mild in the pons. Findings have progressed from the prior MRI of 05/08/2010.   Prominent perivascular space versus small chronic lacunar infarct within the left thalamus, new from the prior MRI.   Mild generalized parenchymal atrophy.

## 2022-03-07 NOTE — Addendum Note (Signed)
Addended by: Suzzanne Cloud on: 03/07/2022 10:16 AM   Modules accepted: Orders

## 2022-03-07 NOTE — Telephone Encounter (Signed)
I called the patient reviewed MRI findings (see prior telephone note). Still neck pain, limited ROM, occipital headache, sometimes wakes up and can't move her right arm, this goes away. Feels losing use of right arm and leg overtime. Please call her house and cell phone for appointment. Referral for neurosurgical opinion placed.

## 2022-03-08 ENCOUNTER — Telehealth: Payer: Self-pay | Admitting: Neurology

## 2022-03-08 NOTE — Procedures (Signed)
   HISTORY: 76 year old female with history of poorly controlled diabetes presenting with passing out episode  TECHNIQUE:  This is a routine 16 channel EEG recording with one channel devoted to a limited EKG recording.  It was performed during wakefulness, drowsiness and asleep.  Hyperventilation and photic stimulation were performed as activating procedures.  There are minimum muscle and movement artifact noted.  Upon maximum arousal, posterior dominant waking rhythm consistent of rhythmic alpha range activity with mixed beta activity.  Activities are symmetric over the bilateral posterior derivations and attenuated with eye opening.  Hyperventilation produced mild/moderate buildup with higher amplitude and the slower activities noted.  Photic stimulation did not alter the tracing.  During EEG recording, patient developed drowsiness and entered sleep, sleep EEG demonstrated architecture, there were frontal centrally dominant vertex waves and symmetric sleep spindles noted.  During EEG recording, there was no epileptiform discharge noted.  EKG demonstrate sinus rhythm, with heart rate of 60 bpm  CONCLUSION: This is a  normal awake and asleep EEG.  There is no electrodiagnostic evidence of epileptiform discharge.  Marcial Pacas, M.D. Ph.D.  [facility] [place], [place] [address] Phone: [phone] Fax:      [phone]

## 2022-03-08 NOTE — Telephone Encounter (Signed)
Sent to [facility] ph # [phone]

## 2022-03-19 ENCOUNTER — Other Ambulatory Visit: Payer: Self-pay | Admitting: Internal Medicine

## 2022-03-19 DIAGNOSIS — F330 Major depressive disorder, recurrent, mild: Secondary | ICD-10-CM

## 2022-03-21 ENCOUNTER — Telehealth: Payer: Self-pay | Admitting: Internal Medicine

## 2022-03-21 NOTE — Telephone Encounter (Signed)
Calling in to get Dr. Saunders Revel to look at her MRI results before her appt. Please advise

## 2022-03-22 ENCOUNTER — Encounter: Payer: Self-pay | Admitting: Medical

## 2022-03-22 ENCOUNTER — Ambulatory Visit: Payer: Medicare Other | Attending: Medical | Admitting: Medical

## 2022-03-22 ENCOUNTER — Other Ambulatory Visit
Admission: RE | Admit: 2022-03-22 | Discharge: 2022-03-22 | Disposition: A | Discharge status: Home / Self Care | Payer: Medicare Other | Source: Ambulatory Visit | Attending: Medical | Admitting: Medical

## 2022-03-22 VITALS — BP 120/70 | HR 95 | Ht 64.5 in | Wt 233.0 lb

## 2022-03-22 DIAGNOSIS — I5032 Chronic diastolic (congestive) heart failure: Secondary | ICD-10-CM | POA: Diagnosis not present

## 2022-03-22 DIAGNOSIS — E782 Mixed hyperlipidemia: Secondary | ICD-10-CM | POA: Insufficient documentation

## 2022-03-22 DIAGNOSIS — I25118 Atherosclerotic heart disease of native coronary artery with other forms of angina pectoris: Secondary | ICD-10-CM | POA: Insufficient documentation

## 2022-03-22 DIAGNOSIS — I10 Essential (primary) hypertension: Secondary | ICD-10-CM | POA: Diagnosis not present

## 2022-03-22 LAB — BASIC METABOLIC PANEL
Anion gap: 14 (ref 5–15)
BUN: 13 mg/dL (ref 8–23)
CO2: 24 mmol/L (ref 22–32)
Calcium: 8.3 mg/dL — ABNORMAL LOW (ref 8.9–10.3)
Chloride: 101 mmol/L (ref 98–111)
Creatinine, Ser: 1.32 mg/dL — ABNORMAL HIGH (ref 0.44–1.00)
GFR, Estimated: 42 mL/min — ABNORMAL LOW (ref >60)
Glucose, Bld: 301 mg/dL — ABNORMAL HIGH (ref 70–99)
Potassium: 3.4 mmol/L — ABNORMAL LOW (ref 3.5–5.1)
Sodium: 139 mmol/L (ref 135–145)

## 2022-03-22 MED ORDER — RANOLAZINE ER 1000 MG PO TB12: 1000.0000 mg | ORAL_TABLET | Freq: Two times a day (BID) | ORAL | 3 refills | Status: DC

## 2022-03-22 NOTE — Patient Instructions (Signed)
Medication Instructions:   Your physician has recommended you make the following change in your medication:  INCREASE Ranexa - take one tablet ('1000mg'$ ) by mouth twice a day.   *If you need a refill on your cardiac medications before your next appointment, please call your pharmacy*   Lab Work:  Your physician recommends that you have lab work completed today at the medical mall - [facility]  If you have labs (blood work) drawn today and your tests are completely normal, you will receive your results only by: MyChart Message (if you have MyChart) OR A paper copy in the mail If you have any lab test that is abnormal or we need to change your treatment, we will call you to review the results.   Testing/Procedures:  None Ordered   Follow-Up: At [facility], you and your health needs are our priority.  As part of our continuing mission to provide you with exceptional heart care, we have created designated Provider Care Teams.  These Care Teams include your primary Cardiologist (physician) and Advanced Practice Providers (APPs -  Physician Assistants and Nurse Practitioners) who all work together to provide you with the care you need, when you need it.  We recommend signing up for the patient portal called "MyChart".  Sign up information is provided on this After Visit Summary.  MyChart is used to connect with patients for Virtual Visits (Telemedicine).  Patients are able to view lab/test results, encounter notes, upcoming appointments, etc.  Non-urgent messages can be sent to your provider as well.   To learn more about what you can do with MyChart, go to [email or web address].    Your next appointment:   2 month(s)  The format for your next appointment:   In Person  Provider:   You may see [name], MD or one of the following Advanced Practice Providers on your designated Care Team:   Murray Hodgkins, NP Christell Faith, PA-C Cadence Kathlen Mody, PA-C Gerrie Nordmann, NP    Important Information About Sugar

## 2022-03-22 NOTE — Progress Notes (Signed)
Cardiology Office Note:    Date:  03/23/2022   ID:  Denise Sharp, DOB 1946/04/12, MRN [id number]  PCP:  [name], MD  [facility] Cardiologist:  [name], MD  [facility] Electrophysiologist:  None   Referring MD: [name], MD   Chief Complaint: 2 month follow-up  History of Present Illness:    Denise Sharp is a 76 y.o. female with a hx of of CAD status post DES x2 to mid/ostial LAD 01/03/2020, tobacco use, hypertension, type 2 diabetes, iron deficiency anemia following gastric bypass x2, IBS, esophageal stenosis, chronic dizziness with gait instability, cervicogenic headache with cervical spine stenosis, OSA on CPAP, restless leg syndrome, adrenal mass.   Patient underwent echo 2013 for dizziness and dyspnea that showed EF 55 to 40%, normal diastolic dysfunction.  She has difficult to control hypertension on multiple agents.  In 2014 she underwent renal artery angiography which was normal and coronary angiography which was also normal with minimal luminal irregularities.   01/03/2020 she had worsening chest pain for 2 weeks which progressed to unstable angina.  She was taken for left heart cath which showed 65% ostial LAD disease, 80% mid LAD disease, LVEDP only mildly elevated, LV gram with normal EF.  She underwent PCI with DES x2 to mid/ostial LAD.  Echo showed EF 65 to 98%, grade 2 diastolic dysfunction, normal RV, no significant valvular abnormality.   She was seen back on 09/04/2020 despite intervention she still felt fairly poorly with shortness of breath.  She was started on Lasix with little improvement.  She was set up for right and left heart cath which showed nonobstructive coronary disease with 40% ostial RCA stenosis, widely patent LAD stents.  Right heart cath showed RA 17 mmHg, PCWP 22 mmHg.  She was switched to torsemide at one-point and metolazone was added.  She saw Georjean Mode 11/02/2020 for palpitations and nausea.  Heart monitor was placed  showing 11 beats NSVT longest run 11 beats, 14 SVT episodes longest was 18 beats.  She was started on metoprolol.  Patient developed AKI thought to be secondary to overdiuresis and torsemide was decreased.  She was for to nephrology and advanced heart failure clinic.   Seen in clinic 01/30/2021 and reported persistent chest pain.  A Myoview Lexiscan was ordered. Torsemide dose of '20mg'$  daily was continued.  Myocardial perfusion stress test showed low risk without ischemia or scar.  Seen in June 2023 reported intermittent chest pain. A Myoview Lexiscan was ordered, this was low risk without evidence of ischemia or scar. She was started on Imdur.   Last seen 02/01/22 and reported dizziness and lightheadedness on Imdur, so this was stopped. She was started on Ranexa.   Today, the patient reports hearing is getting worse. She also had a MRI of the head showing 2 possible old infarcts, she will see neurology.  BP is good today. She is still having chest pain despite taking the Ranexa. Chest pain is sharp on the left sie and can be at rest or on exertion. The chest pain can wake her up at night. The patient reports intermittent lower leg edema, but she is not taking fluid pills daily. She only took torsemide twice this week.   BMET today Increase Ranexa '1000mg'$ BID  Past Medical History:  Diagnosis Date   (HFpEF) heart failure with preserved ejection fraction ([place])    a. 12/2019 Echo: EF 65-70%, Gr2 DD. No significant valvular dzs.   Abnormality of gait 03/25/2013  Adrenal mass, left (HCC)    Anginal pain (HCC)    Anxiety    Aortic atherosclerosis (HCC)    Arthritis    Asthma    CAD (coronary artery disease) with h/o Atypical Chest Pain    a. 04/1986 Cath ([facility]): nl cors, EF 65%; b. 07/2012 [facility]): Diff minor irregs; c. 07/2016 [facility]): "Equivocal"; d. 08/2016 Cardiac CT Ca2+ score [facility]): Ca2+ 3710; e. 05/2019 MV: No ischemia. EF 75%; f. 12/2019 PCI: LM nl, LAD 65ost (3.5x12 Resolute DES), 64m (2.75x12 Resolute DES),LCX/RCA nl; g. 08/2020 Cath: patent LAD stents, RCA 40ost, elev RH pressures, EF >65%.   Cervical spinal stenosis 1994   due to trauma to back ([employer] accident), has intermittent paralysis and parasthesias   Cervicogenic headache 03/23/2014   CKD (chronic kidney disease), stage III (HCC)    Depression    Diverticulosis    Dizziness    a.) chronic   DJD (degenerative joint disease)    a. Chronic R shoulder pain   Dyspnea    Esophageal stenosis 09/2009   a.) transient outlet obstruction by food, cleared by EGD   Family history of adverse reaction to anesthesia    a.) daughter with (+) PONV   Gastric bypass status for obesity    GERD (gastroesophageal reflux disease)    Headache(784.0)    HLD (hyperlipidemia)    Hypertension    IBS (irritable bowel syndrome)    IDA (iron deficiency anemia)    a.) post 2 unit txfsn 2009, normal endo/colonoscopy by Wohl   Left bundle branch block (LBBB)    a.) Intermittently present - likely rate related.   NSVT (nonsustained ventricular tachycardia) ([place] 11/15/2020   a.) Holter study 11/15/2020; 11 episodes of NSVT lasting up to 8 beats with a maximum rate of 210 bpm; 14 atrial runs lasting up to 18 beats with a rate of up to 250 bpm.  Some atrial runs felt to be NSVT.   NSVT (nonsustained ventricular tachycardia) ([place]    a. 10/2020 [facility]: 11 runs of NSVT up to 8 beats.   Obesity    OSA on CPAP    Polyneuropathy in diabetes(357.2) 03/25/2013   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/09/2021   Right V1 distribution   PSVT (paroxysmal supraventricular tachycardia) ([place]    a. 10/2020 [facility]: 14 atrial runs up to 18 beats, max HR 250.   Restless leg syndrome    Rotator cuff arthropathy, right 08/13/2017   Syncope and collapse 03/12/2014   Type II diabetes mellitus ([place]     Past Surgical History:  Procedure Laterality Date   APPENDECTOMY     BACK SURGERY     CHOLECYSTECTOMY N/A 1976   COLONOSCOPY     CORONARY ANGIOPLASTY     CORONARY  STENT INTERVENTION N/A 01/04/2020   Procedure: CORONARY STENT INTERVENTION;  Surgeon: AWellington Hampshire MD;  [place] [facility];  Service: Cardiovascular;  Laterality: N/A;  LAD    DIAPHRAGMATIC HERNIA REPAIR  2015   ESOPHAGEAL DILATION     multiple   ESOPHAGOGASTRODUODENOSCOPY (EGD) WITH PROPOFOL N/A 05/11/2021   Procedure: ESOPHAGOGASTRODUODENOSCOPY (EGD) WITH PROPOFOL;  Surgeon: WLucilla Lame MD;  Location: [facility];  Service: Endoscopy;  Laterality: N/A;   ESOPHAGOGASTRODUODENOSCOPY (EGD) WITH PROPOFOL N/A 06/13/2021   Procedure: ESOPHAGOGASTRODUODENOSCOPY (EGD) WITH PROPOFOL;  Surgeon: WLucilla Lame MD;  Location: [facility];  Service: Endoscopy;  Laterality: N/A;   EYE SURGERY     GASTRIC BYPASS  2000, 2005   Dr. REra Skeen  IMPLANT PLACEMENT  10/2011   Hollice Gong IMPLANT PLACEMENT N/A 09/18/2021   Procedure: Barrie Lyme IMPLANT FIRST STAGE;  Surgeon: Bjorn Loser, MD;  Location: [facility];  Service: Urology;  Laterality: N/A;   INTERSTIM IMPLANT PLACEMENT N/A 09/18/2021   Procedure: Barrie Lyme IMPLANT SECOND STAGE WITH IMPEDENCE CHECK;  Surgeon: Bjorn Loser, MD;  Location: [facility];  Service: Urology;  Laterality: N/A;   INTERSTIM IMPLANT REMOVAL N/A 09/18/2021   Procedure: REMOVAL OF INTERSTIM IMPLANT;  Surgeon: Bjorn Loser, MD;  Location: [facility];  Service: Urology;  Laterality: N/A;   LEFT HEART CATH AND CORONARY ANGIOGRAPHY N/A 04/1986   LEFT HEART CATH AND CORONARY ANGIOGRAPHY Left 07/24/2012   LEFT HEART CATH AND CORS/GRAFTS ANGIOGRAPHY N/A 12/31/2019   Procedure: LEFT HEART CATH AND CORS/GRAFTS ANGIOGRAPHY;  Surgeon: [name], MD;  Location: [place] [facility];  Service: Cardiovascular;  Laterality: N/A;   PANNICULECTOMY N/A 06/16/2019   Procedure: PANNICULECTOMY;  Surgeon: [name], MD;  Location: [place];  Service: Plastics;  Laterality: N/A;  3 hours, please   REVERSE SHOULDER ARTHROPLASTY Right 08/13/2017    Procedure: REVERSE RIGHT SHOULDER ARTHROPLASTY;  Surgeon: [name], MD;  Location: [place];  Service: Orthopedics;  Laterality: Right;   RIGHT/LEFT HEART CATH AND CORONARY ANGIOGRAPHY N/A 09/06/2020   Procedure: RIGHT/LEFT HEART CATH AND CORONARY ANGIOGRAPHY;  Surgeon: [name], MD;  Location: [place] [facility];  Service: Cardiovascular;  Laterality: N/A;   ROTATOR CUFF REPAIR Right    [place] EXTRACTION  11/13/2021   TOTAL ABDOMINAL HYSTERECTOMY W/ BILATERAL SALPINGOOPHORECTOMY  1974   TOTAL KNEE ARTHROPLASTY Bilateral 2007   Procedure: TOTAL KNEE ARTHROPLASTY; Surgeons: Mauri Pole, MD and [name], MD   UMBILICAL HERNIA REPAIR  08/11/2015    Current Medications: Current Meds  Medication Sig   albuterol (VENTOLIN HFA) 108 (90 Base) MCG/ACT inhaler Inhale 1-2 puffs into the lungs every 6 (six) hours as needed for wheezing or shortness of breath.   aspirin EC 81 MG tablet Take 81 mg by mouth daily. Swallow whole.   atorvastatin (LIPITOR) 40 MG tablet Take 2 tablets (80 mg total) by mouth daily.   clotrimazole-betamethasone (LOTRISONE) cream Apply 1 application topically 2 (two) times daily as needed (irritation).   Continuous Blood Gluc Receiver (FREESTYLE LIBRE 2 READER) DEVI Use to check sugar at least TID   Continuous Blood Gluc Sensor (FREESTYLE LIBRE 2 SENSOR) MISC 1 application  by Does not apply route every 14 (fourteen) days.   cyanocobalamin (,VITAMIN B-12,) 1000 MCG/ML injection Inject 1 mL (1,000 mcg total) into the muscle every 30 (thirty) days. Inject 1 ml (1000 mcg ) IM weekly x 4,  Then monthly thereafter   dapagliflozin propanediol (FARXIGA) 10 MG TABS tablet Take 1 tablet (10 mg total) by mouth daily.   dicyclomine (BENTYL) 20 MG tablet Take 1 tablet (20 mg total) by mouth 3 (three) times daily before meals.   diphenoxylate-atropine (LOMOTIL) 2.5-0.025 MG tablet Take 1 tablet by mouth 4 (four) times daily as needed for diarrhea or  loose stools.   fexofenadine (ALLEGRA) 180 MG tablet Take 180 mg by mouth as needed for allergies or rhinitis.   glucose blood (ONETOUCH VERIO) test strip Use to check blood sugar twice daily.   HYDROcodone-acetaminophen (NORCO/VICODIN) 5-325 MG tablet Take 1-2 tablets by mouth every 6 (six) hours as needed for moderate pain.   insulin aspart (NOVOLOG FLEXPEN) 100 UNIT/ML FlexPen Inject 4-16 units with meals per  sliding scale: If pre-meal glucose <100, inject 4 units; 100-150 - inject 6 units, 151-200 - inject 8 units; 201-250 - inject 10 units; 251-300 - inject 12 units, 301-350 - inject 14 units ; 350 > inject 16 units   insulin degludec (TRESIBA FLEXTOUCH) 100 UNIT/ML FlexTouch Pen Inject 20 Units into the skin daily.   Insulin Pen Needle (DROPLET PEN NEEDLES) 31G X 5 MM MISC USE ONE NEEDLE SUBCUTANEOUSLY AS DIRECTED (REMOVE AND DISCARD NEEDLE IN SHARPS CONTAINER IMMEDIATELY AFTER USE)   Multiple Vitamins-Minerals (CENTRUM SILVER 50+WOMEN PO) Take 1 tablet by mouth daily.   nitroGLYCERIN (NITROSTAT) 0.4 MG SL tablet Place 1 tablet (0.4 mg total) under the tongue every 5 (five) minutes as needed for chest pain.   nystatin (MYCOSTATIN/NYSTOP) powder Apply 1 g topically 4 (four) times daily as needed (rash).   ondansetron (ZOFRAN-ODT) 4 MG disintegrating tablet Take 1 tablet (4 mg total) by mouth every 8 (eight) hours as needed for nausea or vomiting.   OneTouch Delica Lancets 13K MISC Used to check blood sugar two times a day.   OVER THE COUNTER MEDICATION Apply 1 application topically daily as needed (pain). [place] Gel topical pain reliever   pantoprazole (PROTONIX) 40 MG tablet TAKE 1 TABLET BY MOUTH EVERY DAY   pregabalin (LYRICA) 50 MG capsule TAKE 1 CAPSULE BY MOUTH 2 TIMES DAILY.   sertraline (ZOLOFT) 25 MG tablet TAKE 1 TABLET (25 MG TOTAL) BY MOUTH DAILY.   spironolactone (ALDACTONE) 25 MG tablet Take 0.5 tablets (12.5 mg total) by mouth daily.   [DISCONTINUED] ranolazine (RANEXA) 500 MG 12  hr tablet Take 1 tablet (500 mg total) by mouth 2 (two) times daily.     Allergies:   Demeclocycline, Erythromycin, Flagyl [metronidazole], Glucophage [metformin hcl], Tetracyclines & related, Diovan [valsartan], Sulfa antibiotics, and Xanax [alprazolam]   Social History   Socioeconomic History   Marital status: Married    Spouse name: [name]    Number of children: 2   Years of education: College    Highest education level: Not on file  Occupational History    Employer: retired  Tobacco Use   Smoking status: Never    Passive exposure: Never   Smokeless tobacco: Never  Vaping Use   Vaping Use: Never used  Substance and Sexual Activity   Alcohol use: No   Drug use: Not Currently    Comment: prescribed valium   Sexual activity: Not Currently    Birth control/protection: Post-menopausal, Surgical  Other Topics Concern   Not on file  Social History Narrative   Patient lives at home with husband [name].    Right Handed   Drinks caffeinated tea occasionally   Social Determinants of Health   Financial Resource Strain: Low Risk  (09/15/2021)   Overall Financial Resource Strain (CARDIA)    Difficulty of Paying Living Expenses: Not hard at all  Food Insecurity: No Food Insecurity (10/19/2020)   Hunger Vital Sign    Worried About Running Out of Food in the Last Year: Never true    Ran Out of Food in the Last Year: Never true  Transportation Needs: No Transportation Needs (10/19/2020)   PRAPARE - Hydrologist (Medical): No    Lack of Transportation (Non-Medical): No  Physical Activity: Insufficiently Active (11/15/2020)   Exercise Vital Sign    Days of Exercise per Week: 2 days    Minutes of Exercise per Session: 40 min  Stress: Stress Concern Present (08/03/2020)   [employer]  [employer] - Occupational Stress Questionnaire    Feeling of Stress : Rather much  Social Connections: Not on file     Family History: The patient's family history  includes Depression in her mother; Diabetes in her daughter; Headache in her mother; Heart disease in her daughter, father, and mother; Hypertension in her daughter, father, mother, and son; Osteoporosis in her father; Prostate cancer in her father; Stroke in her father and mother; Thyroid disease in her mother.  ROS:   Please see the history of present illness.     All other systems reviewed and are negative.  EKGs/Labs/Other Studies Reviewed:    The following studies were reviewed today:  Cardiac Cath 08/2020 Conclusions: Non-obstructive coronary artery disease with 40% ostial RCA stenosis. Widely patient ostial/proximal and mid LAD stents, with the distal LAD tapering to a small vessel. Mildly elevated left heart and pulmonary artery pressures. Severely elevated right heart filling pressures. Low Fick cardiac output/index consistent with HFpEF.   Recommendations: Continue gentle diuresis. Encourage weight loss and exercise. Consider outpatient referral to advanced heart failure clinic for further evaluation/management of chronic HFpEF. Continue secondary prevention of coronary artery disease.   [name], MD [facility], [facility]   Myoview Lexiscan 01/2022 Narrative & Impression  Pharmacological myocardial perfusion imaging study with no significant  ischemia Normal wall motion, EF estimated at 95% No EKG changes concerning for ischemia at peak stress or in recovery. CT attenuation correction images with coronary calcification in the LAD Low risk scan   Signed, Esmond Plants, MD, Ph.D [facility]      EKG:  EKG is ordered today.  The ekg ordered today demonstrates NSR 95bpm, nonspecific T wave changes  Recent Labs: 04/04/2021: BNP 45.0 01/05/2022: Hemoglobin 12.8; Platelets 334 02/09/2022: ALT 11 03/22/2022: BUN 13; Creatinine, Ser 1.32; Potassium 3.4; Sodium 139  Recent Lipid Panel    Component Value Date/Time   CHOL 111 02/09/2022 1119   CHOL 155 02/27/2014 0420    TRIG 103.0 02/09/2022 1119   TRIG 121 02/27/2014 0420   HDL 49.10 02/09/2022 1119   HDL 43 02/27/2014 0420   CHOLHDL 2 02/09/2022 1119   VLDL 20.6 02/09/2022 1119   VLDL 24 02/27/2014 0420   LDLCALC 41 02/09/2022 1119   LDLCALC 88 02/27/2014 0420   LDLDIRECT 111.0 06/16/2015 0936    Physical Exam:    VS:  BP 120/70 (BP Location: Left Arm, Patient Position: Sitting, Cuff Size: Large)   Pulse 95   Ht 5' 4.5" (1.638 m)   Wt 233 lb (105.7 kg)   SpO2 95%   BMI 39.38 kg/m     Wt Readings from Last 3 Encounters:  03/22/22 233 lb (105.7 kg)  02/12/22 235 lb (106.6 kg)  02/07/22 235 lb (106.6 kg)     GEN:  Well nourished, well developed in no acute distress HEENT: Normal NECK: No JVD; No carotid bruits LYMPHATICS: No lymphadenopathy CARDIAC: RRR, no murmurs, rubs, gallops RESPIRATORY:  Clear to auscultation without rales, wheezing or rhonchi  ABDOMEN: Soft, non-tender, non-distended MUSCULOSKELETAL:  No edema; No deformity  SKIN: Warm and dry NEUROLOGIC:  Alert and oriented x 3 PSYCHIATRIC:  Normal affect   ASSESSMENT:    1. Coronary artery disease of native artery of native heart with stable angina pectoris ([place])   2. Chronic diastolic heart failure ([place])   3. Essential hypertension   4. Hyperlipidemia, mixed    PLAN:    In order of problems listed above:  CAD with stable angina The patient  reports persistent chest pain despite Ranexa. Chest pain sounds fairly atypical. Prior heart cath and Myoview Lexiscan were overall reassuring. I will increase Ranexa to '1000mg'$  BID. Continue Aspirin and statin.   HFpEF Patient reports intermittent use of spironolactone. She reports occasional lower leg edema. Today, she appears euvolemic on exam. I will check a BMET today. Continue Farxiga and Spironolactone.   HTN BP is good today, continue current medications.   HLD LDL 41. Continue Lipitor '80mg'$  daily.   Disposition: Follow up in 2 month(s) with MD    Signed, Flavius Repsher Ninfa Meeker, PA-C  03/23/2022 9:53 AM    [place]

## 2022-03-22 NOTE — Telephone Encounter (Signed)
Pt has follow up appointment scheduled today with Cadence Kathlen Mody, NP.

## 2022-03-27 ENCOUNTER — Telehealth: Payer: Self-pay

## 2022-03-27 DIAGNOSIS — I5032 Chronic diastolic (congestive) heart failure: Secondary | ICD-10-CM

## 2022-03-27 NOTE — Telephone Encounter (Signed)
-----   Message from [place], PA-C sent at 03/27/2022 10:35 AM EDT ----- Labs showed mildly low potasium. Kidney function is worse. Can we give her 59mq of K to take. Also lets stop the spironolactone. BMET in 1 week

## 2022-03-27 NOTE — Telephone Encounter (Addendum)
Patient made aware of lab results and Cadence Furth, [place] recommendation. Pt will stop spironolactone.  There were additional medications not listed on the pt med list:  Torsemide 20 mg daily. An additional  20 mg daily for swelling or sob Potassium 20 meq daily.  Updated the pt current med list. Pt will take 80 meq of potassium today. Pt will have a repeat bmp next week [facility].  Pt verbalized understanding and voiced appreciation for the call.

## 2022-03-28 DIAGNOSIS — M47812 Spondylosis without myelopathy or radiculopathy, cervical region: Secondary | ICD-10-CM | POA: Diagnosis not present

## 2022-03-29 NOTE — Telephone Encounter (Signed)
Furth, Cadence H, PA-C  You 1 minute ago (11:43 AM)    thanks

## 2022-04-02 ENCOUNTER — Telehealth: Payer: Self-pay | Admitting: Neurology

## 2022-04-02 NOTE — Telephone Encounter (Signed)
Pt states she has been told that she does not suffer from seizures but this past weekend pt states she loss control of her legs and arms, vision was not clear, poor concentration.  Pt states she did not go to any ED or urgent care, she just went to lay down.  Pt states even up until today she is without energy.  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  Pt does not want want to wait until Nov to be seen.  Pt would like to know if she will need to see another Neurologist or if something else is suggested, please call.  Also pt states her spouse told her this episode lasted about 10 mins.

## 2022-04-02 NOTE — Telephone Encounter (Signed)
I reviewed chart. Pt previous pt of Dr. Aletha Halim and has not been seen by MD since 2022, requested to move appt from November due to symptoms. Pt scheduled for tomorrow with Dr. Krista Blue for evaluation.

## 2022-04-03 ENCOUNTER — Encounter: Payer: Self-pay | Admitting: Neurology

## 2022-04-03 ENCOUNTER — Other Ambulatory Visit: Payer: Self-pay | Admitting: Gastroenterology

## 2022-04-03 ENCOUNTER — Other Ambulatory Visit
Admission: RE | Admit: 2022-04-03 | Discharge: 2022-04-03 | Disposition: A | Discharge status: Home / Self Care | Payer: Medicare Other | Attending: Medical | Admitting: Medical

## 2022-04-03 ENCOUNTER — Ambulatory Visit (INDEPENDENT_AMBULATORY_CARE_PROVIDER_SITE_OTHER): Payer: Medicare Other | Admitting: Neurology

## 2022-04-03 VITALS — BP 131/68 | HR 85 | Ht 64.5 in | Wt 229.5 lb

## 2022-04-03 DIAGNOSIS — I5032 Chronic diastolic (congestive) heart failure: Secondary | ICD-10-CM | POA: Insufficient documentation

## 2022-04-03 DIAGNOSIS — R42 Dizziness and giddiness: Secondary | ICD-10-CM

## 2022-04-03 DIAGNOSIS — R269 Unspecified abnormalities of gait and mobility: Secondary | ICD-10-CM | POA: Diagnosis not present

## 2022-04-03 DIAGNOSIS — E1142 Type 2 diabetes mellitus with diabetic polyneuropathy: Secondary | ICD-10-CM | POA: Diagnosis not present

## 2022-04-03 LAB — BASIC METABOLIC PANEL
Anion gap: 11 (ref 5–15)
BUN: 21 mg/dL (ref 8–23)
CO2: 22 mmol/L (ref 22–32)
Calcium: 9 mg/dL (ref 8.9–10.3)
Chloride: 104 mmol/L (ref 98–111)
Creatinine, Ser: 1.56 mg/dL — ABNORMAL HIGH (ref 0.44–1.00)
GFR, Estimated: 34 mL/min — ABNORMAL LOW (ref >60)
Glucose, Bld: 331 mg/dL — ABNORMAL HIGH (ref 70–99)
Potassium: 4.4 mmol/L (ref 3.5–5.1)
Sodium: 137 mmol/L (ref 135–145)

## 2022-04-03 NOTE — Progress Notes (Signed)
Chief Complaint  Patient presents with   Follow-up    Rm 14. Accompanied by husband, Denise Sharp. TOC from Dr. Jannifer Franklin. States she had a seizure like event on Saturday afternoon.      ASSESSMENT AND PLAN  Denise Sharp is a 76 y.o. female   Near syncope episode  Sometimes associated with diabetes, but this time the described episode clearly was documented glucose level around 170s, happened in the sitting down position,  Cardiac monitoring in May 2022 showed multiple brief episode of fast heartbeat, it was difficult for sure she will not have an arrhythmia  With her increased frequency of dizziness, near passing out episode, will refer her to 30 days cardiac monitoring, to rule out cardiac etiology  Extensive neurology evaluation including ultrasound of carotid artery, MRI of the brain showed no significant abnormality,  Encouraged to increase water intake, gradually ramp up activity to increase exercise tolerance  DIAGNOSTIC DATA (LABS, IMAGING, TESTING) - I reviewed patient records, labs, notes, testing and imaging myself where available.   MEDICAL HISTORY:  Denise Sharp, is a 76 year old female, accompanied by her husband Denise Sharp, seen in request by her [facility] care doctor [name] for evaluation of dizziness episode, she was patient of Dr. Jannifer Franklin in the past,  I reviewed and summarized the referring note. PMHX. HLD DM CAD s/p stent Urinary incontinence,  Gastric Bypass twice,  Bilateral Knee replacement    She has diabetic peripheral neuropathy, unsteady gait, chronic low back pain, limited activity, mostly sedentary, reported few episodes of lightheadedness, near passing out over the past few weeks  Few months ago, she was at [facility] fair, had breakfast, walk few isles, around 2 PM, while she was ready for her lunch, riding on her scooter, she suddenly felt lightheaded, wooziness sensation, difficulty focusing, her husband has to help her sliding down to the  chair by the table, did not loss of consciousness, after eating only small amount of meal, she felt better, glucose level was 170 range,  She reported few episode of near passing out in the past, sometimes related to low glucose,  Personally reviewed MRI of the brain March 04, 2022, mild small vessel disease, no acute abnormality MRI of cervical spine, multilevel degenerative changes, no significant canal stenosis, variable degree of foraminal narrowing Nuclear medicine myocardial perfusion study, normal wall motion, normal ejection fraction, CT imaging showed calcification in the LAD, low risk scan  She did have a history of irregular heart rate in the past,   PHYSICAL EXAM:   Vitals:   04/03/22 1039  BP: 131/68  Pulse: 85  Weight: 229 lb 8 oz (104.1 kg)  Height: 5' 4.5" (1.638 m)   Sitting down 120/78, HR 80, standing up 108/70, HR 88.  Body mass index is 38.79 kg/m.  PHYSICAL EXAMNIATION:  Gen: NAD, conversant, well nourised, well groomed                     Cardiovascular: Regular rate rhythm, no peripheral edema, warm, nontender. Eyes: Conjunctivae clear without exudates or hemorrhage Neck: Supple, no carotid bruits. Pulmonary: Clear to auscultation bilaterally   NEUROLOGICAL EXAM:  MENTAL STATUS: Speech/cognition: Awake, alert, oriented to history taking and casual conversation CRANIAL NERVES: CN II: Visual fields are full to confrontation. Pupils are round equal and briskly reactive to light. CN III, IV, VI: extraocular movement are normal. No ptosis. CN V: Facial sensation is intact to light touch CN VII: Face is symmetric with normal eye  closure  CN VIII: Hearing is normal to causal conversation. CN IX, X: Phonation is normal. CN XI: Head turning and shoulder shrug are intact  MOTOR: Mild bilateral ankle dorsiflexion weakness  REFLEXES: Reflexes are 2+ and symmetric at the biceps, triceps, knees, absent and ankles. Plantar responses are  flexor.  Sensory: Length dependent decreased to light touch, pinprick and vibratory sensation to knee level  COORDINATION: There is no trunk or limb dysmetria noted.  GAIT/STANCE: Push-up to get up from seated position, cautious   REVIEW OF SYSTEMS:  Full 14 system review of systems performed and notable only for as above All other review of systems were negative.   ALLERGIES: Allergies  Allergen Reactions   Demeclocycline Hives   Erythromycin Nausea And Vomiting and Other (See Comments)    Severe irritable bowel   Flagyl [Metronidazole] Nausea And Vomiting and Other (See Comments)    Severe irritable bowel   Glucophage [Metformin Hcl] Nausea And Vomiting and Other (See Comments)    "Sick" "I won't take anything that has metformin in it"   Tetracyclines & Related Hives and Rash   Diovan [Valsartan] Nausea Only        Sulfa Antibiotics Rash and Other (See Comments)    As child   Xanax [Alprazolam] Other (See Comments)    Unknown reaction    HOME MEDICATIONS: Current Outpatient Medications  Medication Sig Dispense Refill   albuterol (VENTOLIN HFA) 108 (90 Base) MCG/ACT inhaler Inhale 1-2 puffs into the lungs every 6 (six) hours as needed for wheezing or shortness of breath. 18 g 2   aspirin EC 81 MG tablet Take 81 mg by mouth daily. Swallow whole.     atorvastatin (LIPITOR) 40 MG tablet Take 2 tablets (80 mg total) by mouth daily. 180 tablet 0   clotrimazole-betamethasone (LOTRISONE) cream Apply 1 application topically 2 (two) times daily as needed (irritation).     Continuous Blood Gluc Receiver (FREESTYLE LIBRE 2 READER) DEVI Use to check sugar at least TID 1 each 1   Continuous Blood Gluc Sensor (FREESTYLE LIBRE 2 SENSOR) MISC 1 application  by Does not apply route every 14 (fourteen) days. 2 each 0   cyanocobalamin (,VITAMIN B-12,) 1000 MCG/ML injection Inject 1 mL (1,000 mcg total) into the muscle every 30 (thirty) days. Inject 1 ml (1000 mcg ) IM weekly x 4,  Then  monthly thereafter 3 mL 1   dapagliflozin propanediol (FARXIGA) 10 MG TABS tablet Take 1 tablet (10 mg total) by mouth daily. 90 tablet 0   dicyclomine (BENTYL) 20 MG tablet Take 1 tablet (20 mg total) by mouth 3 (three) times daily before meals. 90 tablet 1   diphenoxylate-atropine (LOMOTIL) 2.5-0.025 MG tablet Take 1 tablet by mouth 4 (four) times daily as needed for diarrhea or loose stools. 60 tablet 5   fexofenadine (ALLEGRA) 180 MG tablet Take 180 mg by mouth as needed for allergies or rhinitis.     glucose blood (ONETOUCH VERIO) test strip Use to check blood sugar twice daily. 100 each 1   HYDROcodone-acetaminophen (NORCO/VICODIN) 5-325 MG tablet Take 1-2 tablets by mouth every 6 (six) hours as needed for moderate pain. 14 tablet 0   insulin aspart (NOVOLOG FLEXPEN) 100 UNIT/ML FlexPen Inject 4-16 units with meals per sliding scale: If pre-meal glucose <100, inject 4 units; 100-150 - inject 6 units, 151-200 - inject 8 units; 201-250 - inject 10 units; 251-300 - inject 12 units, 301-350 - inject 14 units ; 350 > inject 16 units 15  mL 3   insulin degludec (TRESIBA FLEXTOUCH) 100 UNIT/ML FlexTouch Pen Inject 20 Units into the skin daily. 15 mL 3   Insulin Pen Needle (DROPLET PEN NEEDLES) 31G X 5 MM MISC USE ONE NEEDLE SUBCUTANEOUSLY AS DIRECTED (REMOVE AND DISCARD NEEDLE IN SHARPS CONTAINER IMMEDIATELY AFTER USE) 100 each 1   Multiple Vitamins-Minerals (CENTRUM SILVER 50+WOMEN PO) Take 1 tablet by mouth daily.     nitroGLYCERIN (NITROSTAT) 0.4 MG SL tablet Place 1 tablet (0.4 mg total) under the tongue every 5 (five) minutes as needed for chest pain. 25 tablet 1   nystatin (MYCOSTATIN/NYSTOP) powder Apply 1 g topically 4 (four) times daily as needed (rash).     ondansetron (ZOFRAN-ODT) 4 MG disintegrating tablet Take 1 tablet (4 mg total) by mouth every 8 (eight) hours as needed for nausea or vomiting. 30 tablet 0   OneTouch Delica Lancets 21H MISC Used to check blood sugar two times a day. 100  each 4   OVER THE COUNTER MEDICATION Apply 1 application topically daily as needed (pain). [place] Gel topical pain reliever     pantoprazole (PROTONIX) 40 MG tablet TAKE 1 TABLET BY MOUTH EVERY DAY 30 tablet 2   potassium chloride SA (KLOR-CON M) 20 MEQ tablet Take 20 mEq by mouth daily.     pregabalin (LYRICA) 50 MG capsule TAKE 1 CAPSULE BY MOUTH 2 TIMES DAILY. 60 capsule 2   ranolazine (RANEXA) 1000 MG SR tablet Take 1 tablet (1,000 mg total) by mouth 2 (two) times daily. 90 tablet 3   sertraline (ZOLOFT) 25 MG tablet TAKE 1 TABLET (25 MG TOTAL) BY MOUTH DAILY. 90 tablet 1   torsemide (DEMADEX) 20 MG tablet Take 20 mg by mouth daily. Take 1 tablet daily. Take an additional tablet for swelling or shortness of breath     No current facility-administered medications for this visit.    PAST MEDICAL HISTORY: Past Medical History:  Diagnosis Date   (HFpEF) heart failure with preserved ejection fraction ([place])    a. 12/2019 Echo: EF 65-70%, Gr2 DD. No significant valvular dzs.   Abnormality of gait 03/25/2013   Adrenal mass, left (HCC)    Anginal pain (HCC)    Anxiety    Aortic atherosclerosis (HCC)    Arthritis    Asthma    CAD (coronary artery disease) with h/o Atypical Chest Pain    a. 04/1986 Cath ([facility]): nl cors, EF 65%; b. 07/2012 Cath [facility]): Diff minor irregs; c. 07/2016 MV [facility]): "Equivocal"; d. 08/2016 Cardiac CT Ca2+ score [facility]): Ca2+ 0865; e. 05/2019 MV: No ischemia. EF 75%; f. 12/2019 PCI: LM nl, LAD 65ost (3.5x12 Resolute DES), 33m(2.75x12 Resolute DES),LCX/RCA nl; g. 08/2020 Cath: patent LAD stents, RCA 40ost, elev RH pressures, EF >65%.   Cervical spinal stenosis 1994   due to trauma to back ([employer] accident), has intermittent paralysis and parasthesias   Cervicogenic headache 03/23/2014   CKD (chronic kidney disease), stage III (HCC)    Depression    Diverticulosis    Dizziness    a.) chronic   DJD (degenerative joint disease)    a. Chronic R shoulder pain   Dyspnea     Esophageal stenosis 09/2009   a.) transient outlet obstruction by food, cleared by EGD   Family history of adverse reaction to anesthesia    a.) daughter with (+) PONV   Gastric bypass status for obesity    GERD (gastroesophageal reflux disease)    Headache(784.0)    HLD (hyperlipidemia)  Hypertension    IBS (irritable bowel syndrome)    IDA (iron deficiency anemia)    a.) post 2 unit txfsn 2009, normal endo/colonoscopy by [facility]   Left bundle branch block (LBBB)    a.) Intermittently present - likely rate related.   NSVT (nonsustained ventricular tachycardia) ([place]) 11/15/2020   a.) Holter study 11/15/2020; 11 episodes of NSVT lasting up to 8 beats with a maximum rate of 210 bpm; 14 atrial runs lasting up to 18 beats with a rate of up to 250 bpm.  Some atrial runs felt to be NSVT.   NSVT (nonsustained ventricular tachycardia) ([place])    a. 10/2020 [facility]: 11 runs of NSVT up to 8 beats.   Obesity    OSA on CPAP    Polyneuropathy in diabetes(357.2) 03/25/2013   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/09/2021   Right V1 distribution   PSVT (paroxysmal supraventricular tachycardia) ([place])    a. 10/2020 [facility]: 14 atrial runs up to 18 beats, max HR 250.   Restless leg syndrome    Rotator cuff arthropathy, right 08/13/2017   Syncope and collapse 03/12/2014   Type II diabetes mellitus ([place])     PAST SURGICAL HISTORY: Past Surgical History:  Procedure Laterality Date   APPENDECTOMY     BACK SURGERY     CHOLECYSTECTOMY N/A 1976   COLONOSCOPY     CORONARY ANGIOPLASTY     CORONARY STENT INTERVENTION N/A 01/04/2020   Procedure: CORONARY STENT INTERVENTION;  Surgeon: [name], MD;  Location: [place] [facility];  Service: Cardiovascular;  Laterality: N/A;  LAD    DIAPHRAGMATIC HERNIA REPAIR  2015   ESOPHAGEAL DILATION     multiple   ESOPHAGOGASTRODUODENOSCOPY (EGD) WITH PROPOFOL N/A 05/11/2021   Procedure: ESOPHAGOGASTRODUODENOSCOPY (EGD) WITH PROPOFOL;  Surgeon: [name], MD;  Location: [facility]  ENDOSCOPY;  Service: Endoscopy;  Laterality: N/A;   ESOPHAGOGASTRODUODENOSCOPY (EGD) WITH PROPOFOL N/A 06/13/2021   Procedure: ESOPHAGOGASTRODUODENOSCOPY (EGD) WITH PROPOFOL;  Surgeon: [name], MD;  Location: [facility] ENDOSCOPY;  Service: Endoscopy;  Laterality: N/A;   EYE SURGERY     GASTRIC BYPASS  2000, 2005   Dr. [name] IMPLANT PLACEMENT  10/2011   Hollice Gong IMPLANT PLACEMENT N/A 09/18/2021   Procedure: Barrie Lyme IMPLANT FIRST STAGE;  Surgeon: Bjorn Loser, MD;  Location: [facility] ORS;  Service: Urology;  Laterality: N/A;   INTERSTIM IMPLANT PLACEMENT N/A 09/18/2021   Procedure: Barrie Lyme IMPLANT SECOND STAGE WITH IMPEDENCE CHECK;  Surgeon: Bjorn Loser, MD;  Location: [facility] ORS;  Service: Urology;  Laterality: N/A;   INTERSTIM IMPLANT REMOVAL N/A 09/18/2021   Procedure: REMOVAL OF INTERSTIM IMPLANT;  Surgeon: Bjorn Loser, MD;  Location: [facility] ORS;  Service: Urology;  Laterality: N/A;   LEFT HEART CATH AND CORONARY ANGIOGRAPHY N/A 04/1986   LEFT HEART CATH AND CORONARY ANGIOGRAPHY Left 07/24/2012   LEFT HEART CATH AND CORS/GRAFTS ANGIOGRAPHY N/A 12/31/2019   Procedure: LEFT HEART CATH AND CORS/GRAFTS ANGIOGRAPHY;  Surgeon: [name], MD;  Location: [place] [facility];  Service: Cardiovascular;  Laterality: N/A;   PANNICULECTOMY N/A 06/16/2019   Procedure: PANNICULECTOMY;  Surgeon: [name], MD;  Location: [place];  Service: Plastics;  Laterality: N/A;  3 hours, please   REVERSE SHOULDER ARTHROPLASTY Right 08/13/2017   Procedure: REVERSE RIGHT SHOULDER ARTHROPLASTY;  Surgeon: [name], MD;  Location: [place];  Service: Orthopedics;  Laterality: Right;   RIGHT/LEFT HEART CATH AND CORONARY ANGIOGRAPHY N/A 09/06/2020   Procedure: RIGHT/LEFT HEART CATH AND CORONARY ANGIOGRAPHY;  Surgeon:  End, Harrell Gave, MD;  Location: [place] [facility];  Service: Cardiovascular;  Laterality: N/A;   ROTATOR CUFF REPAIR Right    SPINE SURGERY  1995    [place]   TOOTH EXTRACTION  11/13/2021   TOTAL ABDOMINAL HYSTERECTOMY W/ BILATERAL SALPINGOOPHORECTOMY  1974   TOTAL KNEE ARTHROPLASTY Bilateral 2007   Procedure: TOTAL KNEE ARTHROPLASTY; Surgeons: Mauri Pole, MD and [name], MD   UMBILICAL HERNIA REPAIR  08/11/2015    FAMILY HISTORY: Family History  Problem Relation Age of Onset   Heart disease Father    Hypertension Father    Prostate cancer Father    Stroke Father    Osteoporosis Father    Stroke Mother    Depression Mother    Headache Mother    Heart disease Mother    Thyroid disease Mother    Hypertension Mother    Diabetes Daughter    Heart disease Daughter    Hypertension Daughter    Hypertension Son     SOCIAL HISTORY: Social History   Socioeconomic History   Marital status: Married    Spouse name: Denise Sharp    Number of children: 2   Years of education: College    Highest education level: Not on file  Occupational History    Employer: retired  Tobacco Use   Smoking status: Never    Passive exposure: Never   Smokeless tobacco: Never  Vaping Use   Vaping Use: Never used  Substance and Sexual Activity   Alcohol use: No   Drug use: Not Currently    Comment: prescribed valium   Sexual activity: Not Currently    Birth control/protection: Post-menopausal, Surgical  Other Topics Concern   Not on file  Social History Narrative   Patient lives at home with husband Denise Sharp.    Right Handed   Drinks caffeinated tea occasionally   Social Determinants of Health   Financial Resource Strain: Low Risk  (09/15/2021)   Overall Financial Resource Strain (CARDIA)    Difficulty of Paying Living Expenses: Not hard at all  Food Insecurity: No Food Insecurity (10/19/2020)   Hunger Vital Sign    Worried About Running Out of Food in the Last Year: Never true    Ran Out of Food in the Last Year: Never true  Transportation Needs: No Transportation Needs (10/19/2020)   PRAPARE - Hydrologist (Medical): No     Lack of Transportation (Non-Medical): No  Physical Activity: Insufficiently Active (11/15/2020)   Exercise Vital Sign    Days of Exercise per Week: 2 days    Minutes of Exercise per Session: 40 min  Stress: Stress Concern Present (08/03/2020)   [place]    Feeling of Stress : Rather much  Social Connections: Not on file  Intimate Partner Violence: Not At Risk (10/19/2020)   Humiliation, Afraid, Rape, and Kick questionnaire    Fear of Current or Ex-Partner: No    Emotionally Abused: No    Physically Abused: No    Sexually Abused: No      Marcial Pacas, M.D. Ph.D.  [facility] [address], [place], [place] [address] Ph: [phone] Fax: [phone]  CC:  [name], MD [address] [place],  [place] [address]  [name], MD

## 2022-04-09 ENCOUNTER — Ambulatory Visit: Payer: Medicare Other | Admitting: Urology

## 2022-04-11 ENCOUNTER — Ambulatory Visit (INDEPENDENT_AMBULATORY_CARE_PROVIDER_SITE_OTHER): Payer: Medicare Other | Admitting: Family Medicine

## 2022-04-11 ENCOUNTER — Encounter: Payer: Self-pay | Admitting: Family Medicine

## 2022-04-11 DIAGNOSIS — J989 Respiratory disorder, unspecified: Secondary | ICD-10-CM | POA: Diagnosis not present

## 2022-04-11 DIAGNOSIS — I200 Unstable angina: Secondary | ICD-10-CM

## 2022-04-11 LAB — POC COVID19 BINAXNOW: SARS Coronavirus 2 Ag: NEGATIVE

## 2022-04-11 MED ORDER — FLUTICASONE PROPIONATE 50 MCG/ACT NA SUSP: 2.0000 | Freq: Every day | NASAL | 6 refills | Status: AC

## 2022-04-11 MED ORDER — BENZONATATE 200 MG PO CAPS: 200.0000 mg | ORAL_CAPSULE | Freq: Two times a day (BID) | ORAL | 0 refills | Status: DC | PRN

## 2022-04-11 NOTE — Progress Notes (Signed)
Denise Rumps, MD Phone: [phone]  Denise Sharp is a 76 y.o. female who presents today for same-day visit.  Cough: Patient notes symptoms started on 04/06/2022.  She has had cough with postnasal drip over the last several days with some sore throat.  Her ears feel congested.  She had some trouble breathing when she is coughing though otherwise is at her baseline.  She notes her chest hurts when she coughs.  She notes the cough is dry.  She has a lack of energy.  She did not test for COVID.  She has been taking some 100 mg Tessalon Perles which were initially beneficial though it become less beneficial.  Social History   Tobacco Use  Smoking Status Never   Passive exposure: Never  Smokeless Tobacco Never    Current Outpatient Medications on File Prior to Visit  Medication Sig Dispense Refill   albuterol (VENTOLIN HFA) 108 (90 Base) MCG/ACT inhaler Inhale 1-2 puffs into the lungs every 6 (six) hours as needed for wheezing or shortness of breath. 18 g 2   aspirin EC 81 MG tablet Take 81 mg by mouth daily. Swallow whole.     atorvastatin (LIPITOR) 40 MG tablet Take 2 tablets (80 mg total) by mouth daily. 180 tablet 0   clotrimazole-betamethasone (LOTRISONE) cream Apply 1 application topically 2 (two) times daily as needed (irritation).     Continuous Blood Gluc Receiver (FREESTYLE LIBRE 2 READER) DEVI Use to check sugar at least TID 1 each 1   Continuous Blood Gluc Sensor (FREESTYLE LIBRE 2 SENSOR) MISC 1 application  by Does not apply route every 14 (fourteen) days. 2 each 0   cyanocobalamin (,VITAMIN B-12,) 1000 MCG/ML injection Inject 1 mL (1,000 mcg total) into the muscle every 30 (thirty) days. Inject 1 ml (1000 mcg ) IM weekly x 4,  Then monthly thereafter 3 mL 1   dapagliflozin propanediol (FARXIGA) 10 MG TABS tablet Take 1 tablet (10 mg total) by mouth daily. 90 tablet 0   dicyclomine (BENTYL) 20 MG tablet TAKE 1 TABLET (20 MG TOTAL) BY MOUTH 3 (THREE) TIMES DAILY BEFORE  MEALS. 90 tablet 0   diphenoxylate-atropine (LOMOTIL) 2.5-0.025 MG tablet Take 1 tablet by mouth 4 (four) times daily as needed for diarrhea or loose stools. 60 tablet 5   fexofenadine (ALLEGRA) 180 MG tablet Take 180 mg by mouth as needed for allergies or rhinitis.     glucose blood (ONETOUCH VERIO) test strip Use to check blood sugar twice daily. 100 each 1   HYDROcodone-acetaminophen (NORCO/VICODIN) 5-325 MG tablet Take 1-2 tablets by mouth every 6 (six) hours as needed for moderate pain. 14 tablet 0   insulin aspart (NOVOLOG FLEXPEN) 100 UNIT/ML FlexPen Inject 4-16 units with meals per sliding scale: If pre-meal glucose <100, inject 4 units; 100-150 - inject 6 units, 151-200 - inject 8 units; 201-250 - inject 10 units; 251-300 - inject 12 units, 301-350 - inject 14 units ; 350 > inject 16 units 15 mL 3   insulin degludec (TRESIBA FLEXTOUCH) 100 UNIT/ML FlexTouch Pen Inject 20 Units into the skin daily. 15 mL 3   Insulin Pen Needle (DROPLET PEN NEEDLES) 31G X 5 MM MISC USE ONE NEEDLE SUBCUTANEOUSLY AS DIRECTED (REMOVE AND DISCARD NEEDLE IN SHARPS CONTAINER IMMEDIATELY AFTER USE) 100 each 1   Multiple Vitamins-Minerals (CENTRUM SILVER 50+WOMEN PO) Take 1 tablet by mouth daily.     nitroGLYCERIN (NITROSTAT) 0.4 MG SL tablet Place 1 tablet (0.4 mg total) under the tongue every 5 (  five) minutes as needed for chest pain. 25 tablet 1   nystatin (MYCOSTATIN/NYSTOP) powder Apply 1 g topically 4 (four) times daily as needed (rash).     ondansetron (ZOFRAN-ODT) 4 MG disintegrating tablet Take 1 tablet (4 mg total) by mouth every 8 (eight) hours as needed for nausea or vomiting. 30 tablet 0   OneTouch Delica Lancets 50D MISC Used to check blood sugar two times a day. 100 each 4   OVER THE COUNTER MEDICATION Apply 1 application topically daily as needed (pain). [place] Gel topical pain reliever     pantoprazole (PROTONIX) 40 MG tablet TAKE 1 TABLET BY MOUTH EVERY DAY 30 tablet 2   potassium chloride SA (KLOR-CON  M) 20 MEQ tablet Take 20 mEq by mouth daily.     pregabalin (LYRICA) 50 MG capsule TAKE 1 CAPSULE BY MOUTH 2 TIMES DAILY. 60 capsule 2   ranolazine (RANEXA) 1000 MG SR tablet Take 1 tablet (1,000 mg total) by mouth 2 (two) times daily. 90 tablet 3   sertraline (ZOLOFT) 25 MG tablet TAKE 1 TABLET (25 MG TOTAL) BY MOUTH DAILY. 90 tablet 1   torsemide (DEMADEX) 20 MG tablet Take 20 mg by mouth daily. Take 1 tablet daily. Take an additional tablet for swelling or shortness of breath     No current facility-administered medications on file prior to visit.     ROS see history of present illness  Objective  Physical Exam Vitals:   04/11/22 1334  BP: 132/82  Pulse: 77  Temp: 98.2 F (36.8 C)  SpO2: 99%    BP Readings from Last 3 Encounters:  04/11/22 132/82  04/03/22 131/68  03/22/22 120/70   Wt Readings from Last 3 Encounters:  04/11/22 232 lb 3.2 oz (105.3 kg)  04/03/22 229 lb 8 oz (104.1 kg)  03/22/22 233 lb (105.7 kg)    Physical Exam Constitutional:      General: She is not in acute distress.    Appearance: She is not diaphoretic.  HENT:     Right Ear: Tympanic membrane normal.     Left Ear: Tympanic membrane normal.     Mouth/Throat:     Mouth: Mucous membranes are moist.     Pharynx: Oropharynx is clear. No oropharyngeal exudate.  Cardiovascular:     Rate and Rhythm: Normal rate and regular rhythm.     Heart sounds: Normal heart sounds.  Pulmonary:     Effort: Pulmonary effort is normal.     Breath sounds: Normal breath sounds.  Lymphadenopathy:     Cervical: No cervical adenopathy.  Skin:    General: Skin is warm and dry.  Neurological:     Mental Status: She is alert.      Assessment/Plan: Please see individual problem list.  Problem List Items Addressed This Visit     Respiratory illness    Patient with likely viral respiratory illness versus allergies.  Discussed there were no findings of a bacterial illness on her exam.  Discussed supportive care.   We will try a slightly higher dose Tessalon Perles 200 mg twice daily as needed for cough.  If [employer] are not beneficial we could consider a narcotic cough suppressant if needed.  She will try Flonase to see if that will help with her drainage symptoms.  If she develops significant congestion she will let us know.  If her symptoms are not improving by next Monday she will let us know.  If she has significantly worsening symptoms, shortness of breath,  fever of 103 F or higher, or has cough productive of blood she will seek medical attention in person for reevaluation.      Relevant Medications   benzonatate (TESSALON) 200 MG capsule   fluticasone (FLONASE) 50 MCG/ACT nasal spray   Other Relevant Orders   POC COVID-19 (Completed)   Patient notes she has seen neurology for evaluation of "seizures" and they did not think she was having seizures. They referred her to cardiology for evaluation of these episodes. She has an appointment in November. I discussed she could call cardiology and see if they could see her sooner if she wanted though she should at least keep her scheduled visit.   Return if symptoms worsen or fail to improve.   Denise Rumps, MD [place]

## 2022-04-11 NOTE — Assessment & Plan Note (Addendum)
Patient with likely viral respiratory illness versus allergies.  Discussed there were no findings of a bacterial illness on her exam.  Discussed supportive care.  We will try a slightly higher dose Tessalon Perles 200 mg twice daily as needed for cough.  If [employer] are not beneficial we could consider a narcotic cough suppressant if needed.  She will try Flonase to see if that will help with her drainage symptoms.  If she develops significant congestion she will let us know.  If her symptoms are not improving by next Monday she will let us know.  If she has significantly worsening symptoms, shortness of breath, fever of 103 F or higher, or has cough productive of blood she will seek medical attention in person for reevaluation.

## 2022-04-11 NOTE — Patient Instructions (Signed)
Nice to see you. Please try the Tessalon Perles and Flonase.  If these are not beneficial for your symptoms please let us know. If you develop cough productive of blood, shortness of breath, fever 103 F or higher, or any significantly worsening symptoms please seek medical attention in person.

## 2022-04-18 ENCOUNTER — Telehealth: Payer: Self-pay | Admitting: Neurology

## 2022-04-18 DIAGNOSIS — R42 Dizziness and giddiness: Secondary | ICD-10-CM

## 2022-04-18 DIAGNOSIS — R55 Syncope and collapse: Secondary | ICD-10-CM

## 2022-04-18 NOTE — Telephone Encounter (Signed)
Order placed incorrectly for cardiac event monitor, please resend for [facility]

## 2022-04-18 NOTE — Telephone Encounter (Signed)
New order placed

## 2022-04-18 NOTE — Addendum Note (Signed)
Addended by: Florian Buff C on: 04/18/2022 10:27 AM   Modules accepted: Orders

## 2022-04-19 ENCOUNTER — Other Ambulatory Visit: Payer: Self-pay | Admitting: *Deleted

## 2022-04-19 DIAGNOSIS — N3941 Urge incontinence: Secondary | ICD-10-CM

## 2022-04-19 NOTE — Addendum Note (Signed)
Addended by: Despina Hidden on: 04/19/2022 11:21 AM   Modules accepted: Orders

## 2022-04-20 ENCOUNTER — Ambulatory Visit (INDEPENDENT_AMBULATORY_CARE_PROVIDER_SITE_OTHER): Payer: Medicare Other | Admitting: Physician Assistant

## 2022-04-20 VITALS — BP 130/74 | HR 78 | Ht 64.5 in | Wt 233.0 lb

## 2022-04-20 DIAGNOSIS — N390 Urinary tract infection, site not specified: Secondary | ICD-10-CM

## 2022-04-20 DIAGNOSIS — N3946 Mixed incontinence: Secondary | ICD-10-CM | POA: Diagnosis not present

## 2022-04-20 DIAGNOSIS — N3000 Acute cystitis without hematuria: Secondary | ICD-10-CM

## 2022-04-20 DIAGNOSIS — N3941 Urge incontinence: Secondary | ICD-10-CM | POA: Diagnosis not present

## 2022-04-20 LAB — URINALYSIS, COMPLETE
Bilirubin, UA: NEGATIVE
Ketones, UA: NEGATIVE
Nitrite, UA: NEGATIVE
Protein,UA: NEGATIVE
Specific Gravity, UA: 1.01 (ref 1.005–1.030)
Urobilinogen, Ur: 0.2 mg/dL (ref 0.2–1.0)
pH, UA: 5 (ref 5.0–7.5)

## 2022-04-20 LAB — MICROSCOPIC EXAMINATION: WBC, UA: >30 /hpf — AB (ref 0–5)

## 2022-04-20 MED ORDER — CEFDINIR 300 MG PO CAPS: 300.0000 mg | ORAL_CAPSULE | Freq: Two times a day (BID) | ORAL | 0 refills | Status: AC

## 2022-04-20 MED ORDER — PREMARIN 0.625 MG/GM VA CREA: TOPICAL_CREAM | VAGINAL | 4 refills | Status: AC

## 2022-04-20 NOTE — Patient Instructions (Signed)
Recurrent UTI Prevention Strategies  Start taking an over-the-counter cranberry supplement for urinary tract health. Take this once or twice daily on an empty stomach, e.g. right before bed. Start taking an over-the-counter d-mannose supplement. Take this daily per packaging instructions. Start taking an over-the-counter probiotic containing the bacterial species called Lactobacillus. Take this daily. Start vaginal estrogen cream. Apply a pea-sized amount around the opening of the urethra every day for 2 weeks, then three times weekly forever.  

## 2022-04-20 NOTE — Progress Notes (Signed)
04/20/2022 2:53 PM   Denise Sharp 02/11/1946 [id number]  CC: Chief Complaint  Patient presents with   Follow-up   HPI: Denise Sharp is a 76 y.o. female with PMH uncontrolled diabetes on insulin, OSA, and OAB wet with mixed incontinence who underwent InterStim exchange with Dr. Matilde Sprang in March 2023 who presents today for evaluation of worsening urinary leakage.  She is accompanied today by her husband, who contributes to HPI.  Today she reports gradual worsening in urinary leakage including high-volume leaks over the past 2 to 3 weeks.  She has had some dysuria and cloudy urine over the past week.   She was previously well managed on InterStim and largely dry.  She states she used to be on program 3 and that worked well for her, however she has been tweaking her settings ever since her symptoms started worsening in the past couple of weeks.  Dr. Matilde Sprang treated her for UTI in July this year.  She wonders what she can do for UTI prevention.  In-office UA today positive for 3+ glucose, trace intact blood, and 1+ leukocytes; urine microscopy with >30 WBCs/HPF and moderate bacteria.   PMH: Past Medical History:  Diagnosis Date   (HFpEF) heart failure with preserved ejection fraction ([place])    a. 12/2019 Echo: EF 65-70%, Gr2 DD. No significant valvular dzs.   Abnormality of gait 03/25/2013   Adrenal mass, left (HCC)    Anginal pain (HCC)    Anxiety    Aortic atherosclerosis (HCC)    Arthritis    Asthma    CAD (coronary artery disease) with h/o Atypical Chest Pain    a. 04/1986 Cath ([facility]): nl cors, EF 65%; b. 07/2012 Cath [facility]): Diff minor irregs; c. 07/2016 MV [facility]): "Equivocal"; d. 08/2016 Cardiac CT Ca2+ score [facility]): Ca2+ 4098; e. 05/2019 MV: No ischemia. EF 75%; f. 12/2019 PCI: LM nl, LAD 65ost (3.5x12 Resolute DES), 82m(2.75x12 Resolute DES),LCX/RCA nl; g. 08/2020 Cath: patent LAD stents, RCA 40ost, elev RH pressures, EF >65%.   Cervical spinal stenosis 1994   due  to trauma to back ([employer] accident), has intermittent paralysis and parasthesias   Cervicogenic headache 03/23/2014   CKD (chronic kidney disease), stage III (HCC)    Depression    Diverticulosis    Dizziness    a.) chronic   DJD (degenerative joint disease)    a. Chronic R shoulder pain   Dyspnea    Esophageal stenosis 09/2009   a.) transient outlet obstruction by food, cleared by EGD   Family history of adverse reaction to anesthesia    a.) daughter with (+) PONV   Gastric bypass status for obesity    GERD (gastroesophageal reflux disease)    Headache(784.0)    HLD (hyperlipidemia)    Hypertension    IBS (irritable bowel syndrome)    IDA (iron deficiency anemia)    a.) post 2 unit txfsn 2009, normal endo/colonoscopy by [facility]   Left bundle branch block (LBBB)    a.) Intermittently present - likely rate related.   NSVT (nonsustained ventricular tachycardia) ([place] 11/15/2020   a.) Holter study 11/15/2020; 11 episodes of NSVT lasting up to 8 beats with a maximum rate of 210 bpm; 14 atrial runs lasting up to 18 beats with a rate of up to 250 bpm.  Some atrial runs felt to be NSVT.   NSVT (nonsustained ventricular tachycardia) [place]    a. 10/2020 [facility]: 11 runs of NSVT up to 8 beats.   Obesity  OSA on CPAP    Polyneuropathy in diabetes(357.2) 03/25/2013   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/09/2021   Right V1 distribution   PSVT (paroxysmal supraventricular tachycardia) ([place])    a. 10/2020 [facility]: 14 atrial runs up to 18 beats, max HR 250.   Restless leg syndrome    Rotator cuff arthropathy, right 08/13/2017   Syncope and collapse 03/12/2014   Type II diabetes mellitus ([place])     Surgical History: Past Surgical History:  Procedure Laterality Date   APPENDECTOMY     BACK SURGERY     CHOLECYSTECTOMY N/A 1976   COLONOSCOPY     CORONARY ANGIOPLASTY     CORONARY STENT INTERVENTION N/A 01/04/2020   Procedure: CORONARY STENT INTERVENTION;  Surgeon: [name], MD;  Location: [place] [facility];  Service: Cardiovascular;  Laterality: N/A;  LAD    DIAPHRAGMATIC HERNIA REPAIR  2015   ESOPHAGEAL DILATION     multiple   ESOPHAGOGASTRODUODENOSCOPY (EGD) WITH PROPOFOL N/A 05/11/2021   Procedure: ESOPHAGOGASTRODUODENOSCOPY (EGD) WITH PROPOFOL;  Surgeon: [name], MD;  Location: [facility];  Service: Endoscopy;  Laterality: N/A;   ESOPHAGOGASTRODUODENOSCOPY (EGD) WITH PROPOFOL N/A 06/13/2021   Procedure: ESOPHAGOGASTRODUODENOSCOPY (EGD) WITH PROPOFOL;  Surgeon: [name], MD;  Location: [facility];  Service: Endoscopy;  Laterality: N/A;   EYE SURGERY     GASTRIC BYPASS  2000, 2005   Dr. [name] IMPLANT PLACEMENT  10/2011   Hollice Gong IMPLANT PLACEMENT N/A 09/18/2021   Procedure: Barrie Lyme IMPLANT FIRST STAGE;  Surgeon: Bjorn Loser, MD;  Location: [facility];  Service: Urology;  Laterality: N/A;   INTERSTIM IMPLANT PLACEMENT N/A 09/18/2021   Procedure: Barrie Lyme IMPLANT SECOND STAGE WITH IMPEDENCE CHECK;  Surgeon: Bjorn Loser, MD;  Location: [facility];  Service: Urology;  Laterality: N/A;   INTERSTIM IMPLANT REMOVAL N/A 09/18/2021   Procedure: REMOVAL OF INTERSTIM IMPLANT;  Surgeon: Bjorn Loser, MD;  Location: [facility];  Service: Urology;  Laterality: N/A;   LEFT HEART CATH AND CORONARY ANGIOGRAPHY N/A 04/1986   LEFT HEART CATH AND CORONARY ANGIOGRAPHY Left 07/24/2012   LEFT HEART CATH AND CORS/GRAFTS ANGIOGRAPHY N/A 12/31/2019   Procedure: LEFT HEART CATH AND CORS/GRAFTS ANGIOGRAPHY;  Surgeon: [name], MD;  Location: [place] [facility];  Service: Cardiovascular;  Laterality: N/A;   PANNICULECTOMY N/A 06/16/2019   Procedure: PANNICULECTOMY;  Surgeon: [name], MD;  Location: [place];  Service: Plastics;  Laterality: N/A;  3 hours, please   REVERSE SHOULDER ARTHROPLASTY Right 08/13/2017   Procedure: REVERSE RIGHT SHOULDER ARTHROPLASTY;  Surgeon: [name], MD;  Location: [facility];  Service: Orthopedics;   Laterality: Right;   RIGHT/LEFT HEART CATH AND CORONARY ANGIOGRAPHY N/A 09/06/2020   Procedure: RIGHT/LEFT HEART CATH AND CORONARY ANGIOGRAPHY;  Surgeon: [name], MD;  Location: [facility];  Service: Cardiovascular;  Laterality: N/A;   ROTATOR CUFF REPAIR Right    SPINE SURGERY  1995   [place]   TOOTH EXTRACTION  11/13/2021   TOTAL ABDOMINAL HYSTERECTOMY W/ BILATERAL SALPINGOOPHORECTOMY  1974   TOTAL KNEE ARTHROPLASTY Bilateral 2007   Procedure: TOTAL KNEE ARTHROPLASTY; Surgeons: Mauri Pole, MD and [name], MD   UMBILICAL HERNIA REPAIR  08/11/2015    Home Medications:  Allergies as of 04/20/2022       Reactions   Demeclocycline Hives   Erythromycin Nausea And Vomiting, Other (See Comments)   Severe irritable bowel   Flagyl [metronidazole] Nausea And Vomiting, Other (See Comments)   Severe irritable bowel   Glucophage [  metformin Hcl] Nausea And Vomiting, Other (See Comments)   "Sick" "I won't take anything that has metformin in it"   Tetracyclines & Related Hives, Rash   Metformin    Diovan [valsartan] Nausea Only       Sulfa Antibiotics Rash, Other (See Comments)   As child   Xanax [alprazolam] Other (See Comments)   Unknown reaction        Medication List        Accurate as of April 20, 2022  2:53 PM. If you have any questions, ask your nurse or doctor.          albuterol 108 (90 Base) MCG/ACT inhaler Commonly known as: VENTOLIN HFA Inhale 1-2 puffs into the lungs every 6 (six) hours as needed for wheezing or shortness of breath.   aspirin EC 81 MG tablet Take 81 mg by mouth daily. Swallow whole.   atorvastatin 40 MG tablet Commonly known as: LIPITOR Take 2 tablets (80 mg total) by mouth daily.   benzonatate 200 MG capsule Commonly known as: TESSALON Take 1 capsule (200 mg total) by mouth 2 (two) times daily as needed for cough.   cefdinir 300 MG capsule Commonly known as: OMNICEF Take 1 capsule (300 mg total) by mouth 2 (two) times daily  for 5 days.   CENTRUM SILVER 50+WOMEN PO Take 1 tablet by mouth daily.   clotrimazole-betamethasone cream Commonly known as: LOTRISONE Apply 1 application topically 2 (two) times daily as needed (irritation).   cyanocobalamin 1000 MCG/ML injection Commonly known as: VITAMIN B12 Inject 1 mL (1,000 mcg total) into the muscle every 30 (thirty) days. Inject 1 ml (1000 mcg ) IM weekly x 4,  Then monthly thereafter   dapagliflozin propanediol 10 MG Tabs tablet Commonly known as: FARXIGA Take 1 tablet (10 mg total) by mouth daily.   dicyclomine 20 MG tablet Commonly known as: BENTYL TAKE 1 TABLET (20 MG TOTAL) BY MOUTH 3 (THREE) TIMES DAILY BEFORE MEALS.   diphenoxylate-atropine 2.5-0.025 MG tablet Commonly known as: Lomotil Take 1 tablet by mouth 4 (four) times daily as needed for diarrhea or loose stools.   Droplet Pen Needles 31G X 5 MM Misc Generic drug: Insulin Pen Needle USE ONE NEEDLE SUBCUTANEOUSLY AS DIRECTED (REMOVE AND DISCARD NEEDLE IN SHARPS CONTAINER IMMEDIATELY AFTER USE)   fexofenadine 180 MG tablet Commonly known as: ALLEGRA Take 180 mg by mouth as needed for allergies or rhinitis.   fluticasone 50 MCG/ACT nasal spray Commonly known as: FLONASE Place 2 sprays into both nostrils daily.   FreeStyle Libre 2 Reader Kerrin Mo Use to check sugar at least TID   FreeStyle Libre 2 Sensor Misc 1 application  by Does not apply route every 14 (fourteen) days.   HYDROcodone-acetaminophen 5-325 MG tablet Commonly known as: NORCO/VICODIN Take 1-2 tablets by mouth every 6 (six) hours as needed for moderate pain.   nitroGLYCERIN 0.4 MG SL tablet Commonly known as: NITROSTAT Place 1 tablet (0.4 mg total) under the tongue every 5 (five) minutes as needed for chest pain.   NovoLOG FlexPen 100 UNIT/ML FlexPen Generic drug: insulin aspart Inject 4-16 units with meals per sliding scale: If pre-meal glucose <100, inject 4 units; 100-150 - inject 6 units, 151-200 - inject 8 units;  201-250 - inject 10 units; 251-300 - inject 12 units, 301-350 - inject 14 units ; 350 > inject 16 units   nystatin powder Commonly known as: MYCOSTATIN/NYSTOP Apply 1 g topically 4 (four) times daily as needed (rash).   ondansetron 4  MG disintegrating tablet Commonly known as: ZOFRAN-ODT Take 1 tablet (4 mg total) by mouth every 8 (eight) hours as needed for nausea or vomiting.   OneTouch Delica Lancets 95G Misc Used to check blood sugar two times a day.   OneTouch Verio test strip Generic drug: glucose blood Use to check blood sugar twice daily.   OVER THE COUNTER MEDICATION Apply 1 application topically daily as needed (pain). [place] Gel topical pain reliever   pantoprazole 40 MG tablet Commonly known as: PROTONIX TAKE 1 TABLET BY MOUTH EVERY DAY   potassium chloride SA 20 MEQ tablet Commonly known as: KLOR-CON M Take 20 mEq by mouth daily.   pregabalin 50 MG capsule Commonly known as: LYRICA TAKE 1 CAPSULE BY MOUTH 2 TIMES DAILY.   Premarin vaginal cream Generic drug: conjugated estrogens Apply one pea-sized amount around the opening of the urethra daily for 2 weeks, then 3 times weekly moving forward.   ranolazine 1000 MG SR tablet Commonly known as: Ranexa Take 1 tablet (1,000 mg total) by mouth 2 (two) times daily.   sertraline 25 MG tablet Commonly known as: ZOLOFT TAKE 1 TABLET (25 MG TOTAL) BY MOUTH DAILY.   torsemide 20 MG tablet Commonly known as: DEMADEX Take 20 mg by mouth daily. Take 1 tablet daily. Take an additional tablet for swelling or shortness of breath   Tresiba FlexTouch 100 UNIT/ML FlexTouch Pen Generic drug: insulin degludec Inject 20 Units into the skin daily.        Allergies:  Allergies  Allergen Reactions   Demeclocycline Hives   Erythromycin Nausea And Vomiting and Other (See Comments)    Severe irritable bowel   Flagyl [Metronidazole] Nausea And Vomiting and Other (See Comments)    Severe irritable bowel   Glucophage  [Metformin Hcl] Nausea And Vomiting and Other (See Comments)    "Sick" "I won't take anything that has metformin in it"   Tetracyclines & Related Hives and Rash   Metformin    Diovan [Valsartan] Nausea Only        Sulfa Antibiotics Rash and Other (See Comments)    As child   Xanax [Alprazolam] Other (See Comments)    Unknown reaction    Family History: Family History  Problem Relation Age of Onset   Heart disease Father    Hypertension Father    Prostate cancer Father    Stroke Father    Osteoporosis Father    Stroke Mother    Depression Mother    Headache Mother    Heart disease Mother    Thyroid disease Mother    Hypertension Mother    Diabetes Daughter    Heart disease Daughter    Hypertension Daughter    Hypertension Son     Social History:   reports that she has never smoked. She has never been exposed to tobacco smoke. She has never used smokeless tobacco. She reports that she does not currently use drugs. She reports that she does not drink alcohol.  Physical Exam: BP 130/74   Pulse 78   Ht 5' 4.5" (1.638 m)   Wt 233 lb (105.7 kg)   BMI 39.38 kg/m   Constitutional:  Alert and oriented, no acute distress, nontoxic appearing HEENT: [place], AT Cardiovascular: No clubbing, cyanosis, or edema Respiratory: Normal respiratory effort, no increased work of breathing Skin: No rashes, bruises or suspicious lesions Neurologic: Grossly intact, no focal deficits, moving all 4 extremities Psychiatric: Normal mood and affect  Laboratory Data: Results for orders placed  or performed in visit on 04/20/22  Microscopic Examination   Urine  Result Value Ref Range   WBC, UA >30 (A) 0 - 5 /hpf   RBC, Urine 0-2 0 - 2 /hpf   Epithelial Cells (non renal) 0-10 0 - 10 /hpf   Bacteria, UA Moderate (A) None seen/Few  Urinalysis, Complete  Result Value Ref Range   Specific Gravity, UA 1.010 1.005 - 1.030   pH, UA 5.0 5.0 - 7.5   Color, UA Yellow Yellow   Appearance Ur Hazy (A)  Clear   Leukocytes,UA 1+ (A) Negative   Protein,UA Negative Negative/Trace   Glucose, UA 3+ (A) Negative   Ketones, UA Negative Negative   RBC, UA Trace (A) Negative   Bilirubin, UA Negative Negative   Urobilinogen, Ur 0.2 0.2 - 1.0 mg/dL   Nitrite, UA Negative Negative   Microscopic Examination See below:    *Note: Due to a large number of results and/or encounters for the requested time period, some results have not been displayed. A complete set of results can be found in Results Review.   Assessment & Plan:   1. Mixed incontinence Worsening urinary incontinence including high-volume leaks over the past 2+ weeks, likely secondary to #2 below.  I initially recommended deferring InterStim management today as originally planned, however since she has been tweaking her programming on her own I elected to proceed with this regardless.  See separate note for details on this.  I will bring her back in 2 weeks for symptom recheck and InterStim recheck after she has been treated with culture appropriate antibiotics.  2. Acute cystitis without hematuria UA appears grossly infected today.  We discussed that acute cystitis can acutely worsen OAB symptoms.  We will treat with empiric Omnicef and send for culture for further evaluation. - Urinalysis, Complete - CULTURE, URINE COMPREHENSIVE - cefdinir (OMNICEF) 300 MG capsule; Take 1 capsule (300 mg total) by mouth 2 (two) times daily for 5 days.  Dispense: 10 capsule; Refill: 0  3. Recurrent UTI This is her second positive urine culture in about 3 months, meeting the clinical definition for recurrent UTIs.  At this point, recommend starting vaginal estrogen cream for UTI prevention.  I explained that this may also improve her urge incontinence.  I explained that topical vaginal estrogen has multiple mechanisms of action to reduce the frequency of UTI including bulking up the urogenital tissue, increasing self-lubrication, acidifying the urogenital  environment, and rebalancing the microbiome. Additionally, I explained that topical vaginal estrogen is not expected to be associated with systemic hormone absorption, and it has not been shown to increase the risk for heart attack, stroke, or other clots.  - conjugated estrogens (PREMARIN) vaginal cream; Apply one pea-sized amount around the opening of the urethra daily for 2 weeks, then 3 times weekly moving forward.  Dispense: 30 g; Refill: 4  Return in about 2 weeks (around 05/04/2022) for intertiem recheck and symptom recheck.  Debroah Loop, PA-C  [facility] [address], [place], [place] [address] [phone]

## 2022-04-20 NOTE — Progress Notes (Signed)
InterStim Management  Impedence check results: None Recent falls: None  Program Setting to light flutter sensation Location of sensation  1  Inferior right buttock  2  Inferior left buttock  3 0.8 Inferior left buttock  4  Mid right buttock  5  Inferior left buttock  6  Inferior right buttock  7  Inferior right buttock   At the conclusion of today's visit, her device was set to P3, setting 0.8.  Patient reported that she felt the sensation starting to migrate more toward the "bladder area" as her conversation continued.

## 2022-04-24 ENCOUNTER — Ambulatory Visit: Payer: Medicare Other | Attending: Neurology

## 2022-04-24 DIAGNOSIS — R55 Syncope and collapse: Secondary | ICD-10-CM

## 2022-04-24 DIAGNOSIS — R42 Dizziness and giddiness: Secondary | ICD-10-CM

## 2022-04-24 LAB — CULTURE, URINE COMPREHENSIVE

## 2022-04-25 DIAGNOSIS — R42 Dizziness and giddiness: Secondary | ICD-10-CM | POA: Diagnosis not present

## 2022-04-25 DIAGNOSIS — R55 Syncope and collapse: Secondary | ICD-10-CM | POA: Diagnosis not present

## 2022-04-26 DIAGNOSIS — I509 Heart failure, unspecified: Secondary | ICD-10-CM | POA: Diagnosis not present

## 2022-04-26 DIAGNOSIS — E1122 Type 2 diabetes mellitus with diabetic chronic kidney disease: Secondary | ICD-10-CM | POA: Diagnosis not present

## 2022-04-26 DIAGNOSIS — N1832 Chronic kidney disease, stage 3b: Secondary | ICD-10-CM | POA: Diagnosis not present

## 2022-04-26 DIAGNOSIS — R809 Proteinuria, unspecified: Secondary | ICD-10-CM | POA: Diagnosis not present

## 2022-04-26 DIAGNOSIS — R609 Edema, unspecified: Secondary | ICD-10-CM | POA: Diagnosis not present

## 2022-04-26 DIAGNOSIS — N3281 Overactive bladder: Secondary | ICD-10-CM | POA: Diagnosis not present

## 2022-04-26 DIAGNOSIS — E785 Hyperlipidemia, unspecified: Secondary | ICD-10-CM | POA: Diagnosis not present

## 2022-04-26 DIAGNOSIS — G4733 Obstructive sleep apnea (adult) (pediatric): Secondary | ICD-10-CM | POA: Diagnosis not present

## 2022-04-26 DIAGNOSIS — R3914 Feeling of incomplete bladder emptying: Secondary | ICD-10-CM | POA: Diagnosis not present

## 2022-04-26 DIAGNOSIS — D649 Anemia, unspecified: Secondary | ICD-10-CM | POA: Diagnosis not present

## 2022-04-26 DIAGNOSIS — D508 Other iron deficiency anemias: Secondary | ICD-10-CM | POA: Diagnosis not present

## 2022-04-26 DIAGNOSIS — Z9884 Bariatric surgery status: Secondary | ICD-10-CM | POA: Diagnosis not present

## 2022-04-30 ENCOUNTER — Other Ambulatory Visit: Payer: Self-pay | Admitting: Gastroenterology

## 2022-04-30 ENCOUNTER — Other Ambulatory Visit: Payer: Self-pay | Admitting: Family Medicine

## 2022-04-30 ENCOUNTER — Other Ambulatory Visit: Payer: Self-pay | Admitting: Internal Medicine

## 2022-04-30 DIAGNOSIS — J989 Respiratory disorder, unspecified: Secondary | ICD-10-CM

## 2022-04-30 MED ORDER — BENZONATATE 200 MG PO CAPS: 200.0000 mg | ORAL_CAPSULE | Freq: Two times a day (BID) | ORAL | 0 refills | Status: DC | PRN

## 2022-04-30 MED ORDER — POTASSIUM CHLORIDE CRYS ER 20 MEQ PO TBCR: 20.0000 meq | EXTENDED_RELEASE_TABLET | Freq: Every day | ORAL | 1 refills | Status: DC

## 2022-04-30 MED ORDER — ONDANSETRON 4 MG PO TBDP
4.0000 mg | ORAL_TABLET | Freq: Three times a day (TID) | ORAL | 0 refills | Status: DC | PRN
Start: 2022-04-30 — End: 2022-07-13

## 2022-04-30 MED ORDER — DICYCLOMINE HCL 20 MG PO TABS: 20.0000 mg | ORAL_TABLET | Freq: Three times a day (TID) | ORAL | 0 refills | Status: DC

## 2022-04-30 NOTE — Telephone Encounter (Signed)
Rx sent through e-scribe Pt is aware of her needing to schedule a follow up at the beginning of 2024

## 2022-05-01 ENCOUNTER — Ambulatory Visit (INDEPENDENT_AMBULATORY_CARE_PROVIDER_SITE_OTHER): Payer: Medicare Other | Admitting: Physician Assistant

## 2022-05-01 ENCOUNTER — Ambulatory Visit
Admission: RE | Admit: 2022-05-01 | Discharge: 2022-05-01 | Disposition: A | Discharge status: Home / Self Care | Payer: Medicare Other | Attending: Physician Assistant | Admitting: Physician Assistant

## 2022-05-01 ENCOUNTER — Ambulatory Visit
Admission: RE | Admit: 2022-05-01 | Discharge: 2022-05-01 | Disposition: A | Discharge status: Home / Self Care | Payer: Medicare Other | Source: Ambulatory Visit | Attending: Physician Assistant | Admitting: Physician Assistant

## 2022-05-01 VITALS — BP 124/66 | HR 73 | Ht 64.5 in | Wt 233.0 lb

## 2022-05-01 DIAGNOSIS — N3946 Mixed incontinence: Secondary | ICD-10-CM | POA: Insufficient documentation

## 2022-05-01 DIAGNOSIS — R81 Glycosuria: Secondary | ICD-10-CM

## 2022-05-01 DIAGNOSIS — R300 Dysuria: Secondary | ICD-10-CM

## 2022-05-01 LAB — MICROSCOPIC EXAMINATION: WBC, UA: >30 /hpf — AB (ref 0–5)

## 2022-05-01 LAB — URINALYSIS, COMPLETE
Bilirubin, UA: NEGATIVE
Ketones, UA: NEGATIVE
Nitrite, UA: NEGATIVE
Protein,UA: NEGATIVE
Specific Gravity, UA: 1.02 (ref 1.005–1.030)
Urobilinogen, Ur: 0.2 mg/dL (ref 0.2–1.0)
pH, UA: 5 (ref 5.0–7.5)

## 2022-05-01 NOTE — Progress Notes (Signed)
05/01/2022 11:47 AM   Denise Sharp 1945/12/05 [id number]  CC: Chief Complaint  Patient presents with   Follow-up    Interiem and recheck    HPI: Denise Sharp is a 76 y.o. female with PMH uncontrolled diabetes on insulin and Farxiga, OSA, and OAB wet with mixed incontinence who underwent InterStim exchange with Dr. Matilde Sprang in March 2023 who presents today for InterStim recheck after I treated her for UTI in the setting of acutely worsened incontinence episodes 2 weeks ago.   Today she reports she was only having intermittent, mild dysuria when I started her on antibiotics for possible UTI.  The day after she completed prescribed Omnicef, her dysuria significantly worsened.  She describes severe dysuria now.  Notably, her most recent urine culture with me earlier this month finalized with mixed urogenital flora.  She underwent cystoscopy with Dr. Erlene Quan in August 2022 for evaluation of microscopic hematuria with findings of diffuse erythema and inflammation at the trigone.  With regard to her urinary leakage, she states that she has not made any tweaks to the device since I last saw her.  At this point, she feels that the InterStim is not working at all.  She continues to report frequent high-volume leaks that have started over the past ~5 weeks.  She was previously well managed with her InterStim and denies any recent high velocity falls, but admits to a fall episode approximately 3 months ago in which her husband was able to catch her and lower her gently to the floor.  She has been on [place] for the past 2 years.  She reports an approximate 51-monthhistory of highly fluctuant blood sugars.  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  Measured residual 225 mL with last void approximately 2  hours prior.  PMH: Past Medical History:  Diagnosis Date   (HFpEF) heart failure with preserved ejection fraction ([place]    a. 12/2019 Echo: EF 65-70%, Gr2 DD. No significant valvular dzs.   Abnormality of gait 03/25/2013   Adrenal mass, left (HCC)    Anginal pain (HCC)    Anxiety    Aortic atherosclerosis (HCC)    Arthritis    Asthma    CAD (coronary artery disease) with h/o Atypical Chest Pain    a. 04/1986 Cath ([facility]): nl cors, EF 65%; b. 07/2012 Cath ([facility]: Diff minor irregs; c. 07/2016 MV ([facility]: "Equivocal"; d. 08/2016 Cardiac CT Ca2+ score ([facility]: Ca2+ 10998 e. 05/2019 MV: No ischemia. EF 75%; f. 12/2019 PCI: LM nl, LAD 65ost (3.5x12 Resolute DES), 827m2.75x12 Resolute DES),LCX/RCA nl; g. 08/2020 Cath: patent LAD stents, RCA 40ost, elev RH pressures, EF >65%.   Cervical spinal stenosis 1994   due to trauma to back ([employer] accident), has intermittent paralysis and parasthesias   Cervicogenic headache 03/23/2014   CKD (chronic kidney disease), stage III (HCC)    Depression    Diverticulosis    Dizziness    a.) chronic   DJD (degenerative joint disease)    a. Chronic R shoulder pain   Dyspnea    Esophageal stenosis 09/2009   a.) transient outlet obstruction by food, cleared by EGD   Family history of adverse reaction to anesthesia    a.) daughter with (+) PONV   Gastric bypass status for obesity    GERD (gastroesophageal reflux  disease)    Headache(784.0)    HLD (hyperlipidemia)    Hypertension    IBS (irritable bowel syndrome)    IDA (iron deficiency anemia)    a.) post 2 unit txfsn 2009, normal endo/colonoscopy by [facility]   Left bundle branch block (LBBB)    a.) Intermittently present - likely rate related.   NSVT (nonsustained ventricular tachycardia) ([place]) 11/15/2020   a.) Holter study 11/15/2020; 11 episodes of NSVT lasting up to 8 beats with a maximum rate of 210 bpm; 14 atrial runs lasting up to 18 beats with a rate of up to 250 bpm.  Some atrial runs felt to be NSVT.    NSVT (nonsustained ventricular tachycardia) ([place])    a. 10/2020 [facility]: 11 runs of NSVT up to 8 beats.   Obesity    OSA on CPAP    Polyneuropathy in diabetes(357.2) 03/25/2013   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/09/2021   Right V1 distribution   PSVT (paroxysmal supraventricular tachycardia) ([place])    a. 10/2020 [facility]: 14 atrial runs up to 18 beats, max HR 250.   Restless leg syndrome    Rotator cuff arthropathy, right 08/13/2017   Syncope and collapse 03/12/2014   Type II diabetes mellitus ([employer])     Surgical History: Past Surgical History:  Procedure Laterality Date   APPENDECTOMY     BACK SURGERY     CHOLECYSTECTOMY N/A 1976   COLONOSCOPY     CORONARY ANGIOPLASTY     CORONARY STENT INTERVENTION N/A 01/04/2020   Procedure: CORONARY STENT INTERVENTION;  Surgeon: [name], MD;  Location: [place] [facility];  Service: Cardiovascular;  Laterality: N/A;  LAD    DIAPHRAGMATIC HERNIA REPAIR  2015   ESOPHAGEAL DILATION     multiple   ESOPHAGOGASTRODUODENOSCOPY (EGD) WITH PROPOFOL N/A 05/11/2021   Procedure: ESOPHAGOGASTRODUODENOSCOPY (EGD) WITH PROPOFOL;  Surgeon: [name], MD;  Location: [facility];  Service: Endoscopy;  Laterality: N/A;   ESOPHAGOGASTRODUODENOSCOPY (EGD) WITH PROPOFOL N/A 06/13/2021   Procedure: ESOPHAGOGASTRODUODENOSCOPY (EGD) WITH PROPOFOL;  Surgeon: [name], MD;  Location: [facility];  Service: Endoscopy;  Laterality: N/A;   EYE SURGERY     GASTRIC BYPASS  2000, 2005   Dr. [name] IMPLANT PLACEMENT  10/2011   Hollice Gong IMPLANT PLACEMENT N/A 09/18/2021   Procedure: Barrie Lyme IMPLANT FIRST STAGE;  Surgeon: Bjorn Loser, MD;  Location: [facility];  Service: Urology;  Laterality: N/A;   INTERSTIM IMPLANT PLACEMENT N/A 09/18/2021   Procedure: Barrie Lyme IMPLANT SECOND STAGE WITH IMPEDENCE CHECK;  Surgeon: Bjorn Loser, MD;  Location: [facility];  Service: Urology;  Laterality: N/A;   INTERSTIM IMPLANT REMOVAL N/A 09/18/2021    Procedure: REMOVAL OF INTERSTIM IMPLANT;  Surgeon: Bjorn Loser, MD;  Location: [facility];  Service: Urology;  Laterality: N/A;   LEFT HEART CATH AND CORONARY ANGIOGRAPHY N/A 04/1986   LEFT HEART CATH AND CORONARY ANGIOGRAPHY Left 07/24/2012   LEFT HEART CATH AND CORS/GRAFTS ANGIOGRAPHY N/A 12/31/2019   Procedure: LEFT HEART CATH AND CORS/GRAFTS ANGIOGRAPHY;  Surgeon: [name], MD;  Location: [place] [facility];  Service: Cardiovascular;  Laterality: N/A;   PANNICULECTOMY N/A 06/16/2019   Procedure: PANNICULECTOMY;  Surgeon: [name], MD;  Location: [place];  Service: Plastics;  Laterality: N/A;  3 hours, please   REVERSE SHOULDER ARTHROPLASTY Right 08/13/2017   Procedure: REVERSE RIGHT SHOULDER ARTHROPLASTY;  Surgeon: [name], MD;  Location: [place];  Service: Orthopedics;  Laterality: Right;   RIGHT/LEFT HEART CATH AND CORONARY ANGIOGRAPHY  N/A 09/06/2020   Procedure: RIGHT/LEFT HEART CATH AND CORONARY ANGIOGRAPHY;  Surgeon: [name], MD;  Location: [place] [facility];  Service: Cardiovascular;  Laterality: N/A;   ROTATOR CUFF REPAIR Right    SPINE SURGERY  1995   [place]   TOOTH EXTRACTION  11/13/2021   TOTAL ABDOMINAL HYSTERECTOMY W/ BILATERAL SALPINGOOPHORECTOMY  1974   TOTAL KNEE ARTHROPLASTY Bilateral 2007   Procedure: TOTAL KNEE ARTHROPLASTY; Surgeons: Mauri Pole, MD and [name], MD   UMBILICAL HERNIA REPAIR  08/11/2015    Home Medications:  Allergies as of 05/01/2022       Reactions   Demeclocycline Hives   Erythromycin Nausea And Vomiting, Other (See Comments)   Severe irritable bowel   Flagyl [metronidazole] Nausea And Vomiting, Other (See Comments)   Severe irritable bowel   Glucophage [metformin Hcl] Nausea And Vomiting, Other (See Comments)   "Sick" "I won't take anything that has metformin in it"   Tetracyclines & Related Hives, Rash   Metformin    Diovan [valsartan] Nausea Only       Sulfa Antibiotics Rash, Other (See Comments)   As  child   Xanax [alprazolam] Other (See Comments)   Unknown reaction        Medication List        Accurate as of May 01, 2022 11:47 AM. If you have any questions, ask your nurse or doctor.          albuterol 108 (90 Base) MCG/ACT inhaler Commonly known as: VENTOLIN HFA Inhale 1-2 puffs into the lungs every 6 (six) hours as needed for wheezing or shortness of breath.   aspirin EC 81 MG tablet Take 81 mg by mouth daily. Swallow whole.   atorvastatin 40 MG tablet Commonly known as: LIPITOR Take 2 tablets (80 mg total) by mouth daily.   benzonatate 200 MG capsule Commonly known as: TESSALON Take 1 capsule (200 mg total) by mouth 2 (two) times daily as needed for cough.   CENTRUM SILVER 50+WOMEN PO Take 1 tablet by mouth daily.   clotrimazole-betamethasone cream Commonly known as: LOTRISONE Apply 1 application topically 2 (two) times daily as needed (irritation).   cyanocobalamin 1000 MCG/ML injection Commonly known as: VITAMIN B12 Inject 1 mL (1,000 mcg total) into the muscle every 30 (thirty) days. Inject 1 ml (1000 mcg ) IM weekly x 4,  Then monthly thereafter   dapagliflozin propanediol 10 MG Tabs tablet Commonly known as: FARXIGA Take 1 tablet (10 mg total) by mouth daily.   dicyclomine 20 MG tablet Commonly known as: BENTYL Take 1 tablet (20 mg total) by mouth 3 (three) times daily before meals.   diphenoxylate-atropine 2.5-0.025 MG tablet Commonly known as: Lomotil Take 1 tablet by mouth 4 (four) times daily as needed for diarrhea or loose stools.   Droplet Pen Needles 31G X 5 MM Misc Generic drug: Insulin Pen Needle USE ONE NEEDLE SUBCUTANEOUSLY AS DIRECTED (REMOVE AND DISCARD NEEDLE IN SHARPS CONTAINER IMMEDIATELY AFTER USE)   fexofenadine 180 MG tablet Commonly known as: ALLEGRA Take 180 mg by mouth as needed for allergies or rhinitis.   fexofenadine 180 MG tablet Commonly known as: ALLEGRA 1 (one) time each day if needed   fluticasone 50  MCG/ACT nasal spray Commonly known as: FLONASE Place 2 sprays into both nostrils daily.   FreeStyle Libre 2 Reader Kerrin Mo Use to check sugar at least TID   FreeStyle Libre 2 Sensor Misc 1 application  by Does not apply route every 14 (fourteen) days.   HYDROcodone-acetaminophen  5-325 MG tablet Commonly known as: NORCO/VICODIN 1 (one) time each day if needed What changed: Another medication with the same name was removed. Continue taking this medication, and follow the directions you see here.   nitroGLYCERIN 0.4 MG SL tablet Commonly known as: NITROSTAT Place 1 tablet (0.4 mg total) under the tongue every 5 (five) minutes as needed for chest pain.   NovoLOG FlexPen 100 UNIT/ML FlexPen Generic drug: insulin aspart Inject 4-16 units with meals per sliding scale: If pre-meal glucose <100, inject 4 units; 100-150 - inject 6 units, 151-200 - inject 8 units; 201-250 - inject 10 units; 251-300 - inject 12 units, 301-350 - inject 14 units ; 350 > inject 16 units   nystatin powder Commonly known as: MYCOSTATIN/NYSTOP Apply 1 g topically 4 (four) times daily as needed (rash).   ondansetron 4 MG disintegrating tablet Commonly known as: ZOFRAN-ODT Take 1 tablet (4 mg total) by mouth every 8 (eight) hours as needed for nausea or vomiting.   OneTouch Delica Lancets 38B Misc Used to check blood sugar two times a day.   OneTouch Verio test strip Generic drug: glucose blood Use to check blood sugar twice daily.   OVER THE COUNTER MEDICATION Apply 1 application topically daily as needed (pain). [place] Gel topical pain reliever   pantoprazole 40 MG tablet Commonly known as: PROTONIX TAKE 1 TABLET BY MOUTH EVERY DAY   potassium chloride SA 20 MEQ tablet Commonly known as: KLOR-CON M Take 1 tablet (20 mEq total) by mouth daily.   pregabalin 50 MG capsule Commonly known as: LYRICA TAKE 1 CAPSULE BY MOUTH 2 TIMES DAILY.   Premarin vaginal cream Generic drug: conjugated estrogens Apply  one pea-sized amount around the opening of the urethra daily for 2 weeks, then 3 times weekly moving forward.   ranolazine 1000 MG SR tablet Commonly known as: Ranexa Take 1 tablet (1,000 mg total) by mouth 2 (two) times daily.   sertraline 25 MG tablet Commonly known as: ZOLOFT TAKE 1 TABLET (25 MG TOTAL) BY MOUTH DAILY.   torsemide 20 MG tablet Commonly known as: DEMADEX Take 20 mg by mouth daily. Take 1 tablet daily. Take an additional tablet for swelling or shortness of breath   Tresiba FlexTouch 100 UNIT/ML FlexTouch Pen Generic drug: insulin degludec Inject 20 Units into the skin daily.        Allergies:  Allergies  Allergen Reactions   Demeclocycline Hives   Erythromycin Nausea And Vomiting and Other (See Comments)    Severe irritable bowel   Flagyl [Metronidazole] Nausea And Vomiting and Other (See Comments)    Severe irritable bowel   Glucophage [Metformin Hcl] Nausea And Vomiting and Other (See Comments)    "Sick" "I won't take anything that has metformin in it"   Tetracyclines & Related Hives and Rash   Metformin    Diovan [Valsartan] Nausea Only        Sulfa Antibiotics Rash and Other (See Comments)    As child   Xanax [Alprazolam] Other (See Comments)    Unknown reaction    Family History: Family History  Problem Relation Age of Onset   Heart disease Father    Hypertension Father    Prostate cancer Father    Stroke Father    Osteoporosis Father    Stroke Mother    Depression Mother    Headache Mother    Heart disease Mother    Thyroid disease Mother    Hypertension Mother    Diabetes Daughter  Heart disease Daughter    Hypertension Daughter    Hypertension Son     Social History:   reports that she has never smoked. She has never been exposed to tobacco smoke. She has never used smokeless tobacco. She reports that she does not currently use drugs. She reports that she does not drink alcohol.  Physical Exam: BP 124/66   Pulse 73   Ht  5' 4.5" (1.638 m)   Wt 233 lb (105.7 kg)   BMI 39.38 kg/m   Constitutional:  Alert and oriented, no acute distress, nontoxic appearing HEENT: [place], AT Cardiovascular: No clubbing, cyanosis, or edema Respiratory: Normal respiratory effort, no increased work of breathing GU: Sparse pubic hair.  Moderate clitoral phimosis.  Negative cough test.  Speculum exam limited due to redundant tissue.  No evidence of pelvic organ prolapse at the level of the vaginal introitus. Skin: No rashes, bruises or suspicious lesions Neurologic: Grossly intact, no focal deficits, moving all 4 extremities Psychiatric: Normal mood and affect  Laboratory Data: Results for orders placed or performed in visit on 05/01/22  Microscopic Examination   Urine  Result Value Ref Range   WBC, UA >30 (A) 0 - 5 /hpf   RBC, Urine 0-2 0 - 2 /hpf   Epithelial Cells (non renal) 0-10 0 - 10 /hpf   Bacteria, UA Moderate (A) None seen/Few   Yeast, UA Present (A) None seen  Urinalysis, Complete  Result Value Ref Range   Specific Gravity, UA 1.020 1.005 - 1.030   pH, UA 5.0 5.0 - 7.5   Color, UA Yellow Yellow   Appearance Ur Cloudy (A) Clear   Leukocytes,UA 1+ (A) Negative   Protein,UA Negative Negative/Trace   Glucose, UA 3+ (A) Negative   Ketones, UA Negative Negative   RBC, UA Trace (A) Negative   Bilirubin, UA Negative Negative   Urobilinogen, Ur 0.2 0.2 - 1.0 mg/dL   Nitrite, UA Negative Negative   Microscopic Examination See below:    *Note: Due to a large number of results and/or encounters for the requested time period, some results have not been displayed. A complete set of results can be found in Results Review.   In and Out Catheterization  Patient is present today for a I & O catheterization due to possible UTI. Patient was cleaned and prepped in a sterile fashion with betadine . A 14FR cath was inserted no complications were noted , 256m of urine return was noted, urine was yellow in color. A clean urine  sample was collected for UA and culture. Bladder was drained and catheter was removed without difficulty.    Performed by: SDebroah Loop PA-C and TMardelle Matte CMA  Assessment & Plan:   1. Mixed incontinence Recent worsening likely multifactorial possible UTI and glucosuria as below.  I changed her InterStim setting today to program 4, intensity 1.2.  Notably, she tends to have sensations in the buttocks at lower intensity settings but as you increase the intensity her sensation moves into the vaginal area.  She reported today that she felt sensation in the vagina appropriately on this new setting.  I have also asked her to obtain a KUB today to assess for appropriate lead positioning given her recent fall in case this was a higher velocity fall than she registered.  We will call her with her results. - DG Abd 1 View; Future  2. Dysuria Cath UA remains grossly infected appearing today.  Will send for culture and treat per results.  If negative, recommend repeat cystoscopy for further evaluation. - Urinalysis, Complete - CULTURE, URINE COMPREHENSIVE  3. Glucosuria Likely contributory to #1 and #2 above and secondary to her uncontrolled insulin-dependent diabetes and Farxiga use.  Return for Will call with results.  Debroah Loop, PA-C  [facility] [address], [place], [place] [address] [phone]

## 2022-05-04 ENCOUNTER — Ambulatory Visit: Payer: Medicare Other | Admitting: Physician Assistant

## 2022-05-05 LAB — CULTURE, URINE COMPREHENSIVE

## 2022-05-07 ENCOUNTER — Telehealth: Payer: Self-pay | Admitting: *Deleted

## 2022-05-07 DIAGNOSIS — N3946 Mixed incontinence: Secondary | ICD-10-CM

## 2022-05-07 NOTE — Telephone Encounter (Signed)
.  left message to have patient return my call.   Per Dr. Matilde Sprang:  Treat yest in urine with 3 days of diflucan  Continue to adjust settings of interstim  Does she know how to adjust programs ?

## 2022-05-07 NOTE — Telephone Encounter (Signed)
May 04, 2022 Debroah Loop, PA-C to Bjorn Loser, MD      05/04/22  8:57 AM I've been seeing her for Interstim management. She fell a couple of months ago but her husband caught her and lowered her to the ground, so they felt it was a low velocity fall. I got a KUB just to be sure. Can you please let me know your thoughts?

## 2022-05-08 MED ORDER — FLUCONAZOLE 150 MG PO TABS: 150.0000 mg | ORAL_TABLET | Freq: Every day | ORAL | 0 refills | Status: AC

## 2022-05-08 MED ORDER — FLUCONAZOLE 200 MG PO TABS: 200.0000 mg | ORAL_TABLET | Freq: Every day | ORAL | 0 refills | Status: AC

## 2022-05-08 NOTE — Telephone Encounter (Signed)
Spoke with patient and sent rx to pharmacy  Patient is stating that the program isn't working, she is " flooding her shoes" with urine. Pt is also waiting on a call back from sam with an appt??  Please advise

## 2022-05-08 NOTE — Telephone Encounter (Signed)
I've sent 3 days of [facility]luc[facility] to [facility] on [address]. Please have her take this. She can follow up with me in clinic on 10/31 OR wait until the following week; I have my full day of Interstim training on 11/3.

## 2022-05-09 NOTE — Telephone Encounter (Signed)
I left a message for the patient to return my call.

## 2022-05-10 ENCOUNTER — Encounter: Payer: Self-pay | Admitting: *Deleted

## 2022-05-10 ENCOUNTER — Ambulatory Visit: Payer: Medicare Other | Admitting: Podiatry

## 2022-05-10 DIAGNOSIS — R42 Dizziness and giddiness: Secondary | ICD-10-CM | POA: Diagnosis not present

## 2022-05-10 NOTE — Telephone Encounter (Signed)
I left a message for the patient to return my call.

## 2022-05-10 NOTE — Telephone Encounter (Signed)
.  left message to have patient return my call.  Sent mycharge

## 2022-05-12 ENCOUNTER — Encounter: Payer: Self-pay | Admitting: Internal Medicine

## 2022-05-12 ENCOUNTER — Other Ambulatory Visit: Payer: Self-pay | Admitting: Gastroenterology

## 2022-05-14 ENCOUNTER — Other Ambulatory Visit: Payer: Self-pay

## 2022-05-14 ENCOUNTER — Ambulatory Visit (INDEPENDENT_AMBULATORY_CARE_PROVIDER_SITE_OTHER): Payer: Medicare Other | Admitting: Physician Assistant

## 2022-05-14 ENCOUNTER — Encounter: Payer: Self-pay | Admitting: Physician Assistant

## 2022-05-14 ENCOUNTER — Other Ambulatory Visit: Payer: Self-pay | Admitting: Internal Medicine

## 2022-05-14 VITALS — BP 116/72 | HR 88 | Ht 64.5 in | Wt 235.0 lb

## 2022-05-14 DIAGNOSIS — J9801 Acute bronchospasm: Secondary | ICD-10-CM

## 2022-05-14 DIAGNOSIS — N3946 Mixed incontinence: Secondary | ICD-10-CM

## 2022-05-14 DIAGNOSIS — J189 Pneumonia, unspecified organism: Secondary | ICD-10-CM

## 2022-05-14 DIAGNOSIS — R051 Acute cough: Secondary | ICD-10-CM

## 2022-05-14 DIAGNOSIS — E1142 Type 2 diabetes mellitus with diabetic polyneuropathy: Secondary | ICD-10-CM

## 2022-05-14 MED ORDER — BUTALBITAL-APAP-CAFFEINE 50-325-40 MG PO TABS
1.0000 | ORAL_TABLET | Freq: Four times a day (QID) | ORAL | 0 refills | Status: DC | PRN
Start: 2022-05-14 — End: 2022-07-23

## 2022-05-14 MED ORDER — DAPAGLIFLOZIN PROPANEDIOL 10 MG PO TABS: 10.0000 mg | ORAL_TABLET | Freq: Every day | ORAL | 3 refills | Status: DC

## 2022-05-14 NOTE — Progress Notes (Signed)
Patient presents to clinic today for InterStim management. She continues to report high volume, flooding leaks on P4, setting 1.2. She was previously poorly controlled on P3, setting 0.8. Prior to Interstim exchange earlier this year, she says she was well managed on P3 for many years.  We ran through the remaining programs today and she continued to feel sensation primarily in the buttocks with these.  Program Notes  1 Inferior right buttock  2 Inferior left buttock  3 Uncontrolled leaks on intensity 0.8, sensation in bottom right buttock (extending down right leg at higher intensity)  4 Uncontrolled leaks on intensity 1.2, sensation in perineum/vagina  5 Inferior left buttock  6 Inferior right buttock  7 Inferior right buttock   At the conclusion of today's visit, her device was set to P3, setting 1.2. I will discuss this further with our InterStim rep later this week and follow up with her via telephone early next week.

## 2022-05-17 NOTE — Telephone Encounter (Signed)
LMTCB. Need to schedule pt a follow up appt with [name] can be virtual if pt prefers.

## 2022-05-19 ENCOUNTER — Telehealth: Payer: Self-pay | Admitting: Physician Assistant

## 2022-05-19 NOTE — Telephone Encounter (Signed)
Paged by [employer] service.  Patient had 11 beats of nonsustained VT at 1157 central time.  Underlying rhythm is sinus.  I have called patient.  She was laying at that time.  No specific symptoms at that time.  Since then feeling numbness and tingling in her right arm.  Some sort of breath.  However this was not occurring during her event.  Advised to monitor symptoms.

## 2022-05-21 NOTE — Telephone Encounter (Signed)
Thank you for the update.  I will review the rhythm strip tomorrow when I am back in the office.  No further intervention recommended at this time.  [name] should continue to wear the monitor as previously recommended.  Denise Bush, MD [facility]

## 2022-05-23 ENCOUNTER — Telehealth: Payer: Self-pay

## 2022-05-23 NOTE — Telephone Encounter (Signed)
Pt called stating she was returning [place] call from last Thursday.   Pt stated to CB on her Cellphone as she will not be home.

## 2022-05-24 NOTE — Telephone Encounter (Signed)
Spoke with pt and scheduled her for a follow up appt.

## 2022-05-24 NOTE — Telephone Encounter (Signed)
Spoke with pt and scheduled her a follow up appt.

## 2022-05-25 ENCOUNTER — Ambulatory Visit (INDEPENDENT_AMBULATORY_CARE_PROVIDER_SITE_OTHER): Payer: Medicare Other | Admitting: Podiatry

## 2022-05-25 ENCOUNTER — Ambulatory Visit (INDEPENDENT_AMBULATORY_CARE_PROVIDER_SITE_OTHER): Payer: Medicare Other

## 2022-05-25 DIAGNOSIS — M2041 Other hammer toe(s) (acquired), right foot: Secondary | ICD-10-CM | POA: Diagnosis not present

## 2022-05-25 DIAGNOSIS — L03031 Cellulitis of right toe: Secondary | ICD-10-CM

## 2022-05-25 DIAGNOSIS — E11610 Type 2 diabetes mellitus with diabetic neuropathic arthropathy: Secondary | ICD-10-CM | POA: Diagnosis not present

## 2022-05-25 DIAGNOSIS — M79676 Pain in unspecified toe(s): Secondary | ICD-10-CM

## 2022-05-25 DIAGNOSIS — E1149 Type 2 diabetes mellitus with other diabetic neurological complication: Secondary | ICD-10-CM

## 2022-05-25 DIAGNOSIS — B351 Tinea unguium: Secondary | ICD-10-CM | POA: Diagnosis not present

## 2022-05-25 DIAGNOSIS — E08621 Diabetes mellitus due to underlying condition with foot ulcer: Secondary | ICD-10-CM | POA: Diagnosis not present

## 2022-05-25 DIAGNOSIS — L97511 Non-pressure chronic ulcer of other part of right foot limited to breakdown of skin: Secondary | ICD-10-CM | POA: Diagnosis not present

## 2022-05-25 DIAGNOSIS — L02611 Cutaneous abscess of right foot: Secondary | ICD-10-CM

## 2022-05-25 DIAGNOSIS — I200 Unstable angina: Secondary | ICD-10-CM

## 2022-05-25 NOTE — Progress Notes (Unsigned)
  Subjective:  Patient ID: [name], female    DOB: 27-Sep-1945,  MRN: [id number]  [name] presents to clinic today for {jgcomplaint:23593}  Chief Complaint  Patient presents with   Nail Problem    Thick painful toenails, 3 month follow up   New problem(s): None. {jgcomplaint:23593}  PCP is [name], MD , and last visit was {Time; dates multiple:15870}.  Allergies  Allergen Reactions   Demeclocycline Hives   Erythromycin Nausea And Vomiting and Other (See Comments)    Severe irritable bowel   Flagyl [Metronidazole] Nausea And Vomiting and Other (See Comments)    Severe irritable bowel   Glucophage [Metformin Hcl] Nausea And Vomiting and Other (See Comments)    "Sick" "I won't take anything that has metformin in it"   Tetracyclines & Related Hives and Rash   Metformin    Diovan [Valsartan] Nausea Only        Sulfa Antibiotics Rash and Other (See Comments)    As child   Xanax [Alprazolam] Other (See Comments)    Unknown reaction    Review of Systems: Negative except as noted in the HPI.  Objective: No changes noted in today's physical examination.  MARGARETTE VANNATTER is a pleasant 76 y.o. female {jgbodyhabitus:24098} AAO x 3.  Assessment/Plan: 1. Pain due to onychomycosis of toenail   2. 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 Charcot foot due to diabetes mellitus ([place])   4. Type II diabetes mellitus with neurological manifestations ([place])     No orders of the defined types were placed in this encounter.   {Jgplan:23602::"-Patient/POA to call should there be question/concern in the interim."}   Return in about 3 weeks (around 06/15/2022).  Marzetta Board, DPM

## 2022-05-25 NOTE — Patient Instructions (Signed)
DRESSING CHANGES RIGHT FOOT:   PHARMACY SHOPPING LIST: Saline or Wound Cleanser for cleaning wound 2 x 2 inch sterile gauze for cleaning wound BETADINE SOLUTION  A. IF DISPENSED, WEAR SURGICAL SHOE OR WALKING BOOT AT ALL TIMES.  B. IF PRESCRIBED ORAL ANTIBIOTICS, TAKE ALL MEDICATION AS PRESCRIBED UNTIL ALL ARE GONE.  C. IF DOCTOR HAS DESIGNATED NONWEIGHTBEARING STATUS, PLEASE ADHERE TO INSTRUCTIONS.  1. KEEP RIGHT FOOT DRY AT ALL TIMES!!!!  2. CLEANSE ULCER WITH SALINE OR WOUND CLEANSER.  3. DAB DRY WITH GAUZE SPONGE.  4. APPLY A LIGHT AMOUNT OF Betadine Solution TO BASE OF ULCER.  5. APPLY OUTER DRESSING AS INSTRUCTED.  6. WEAR SURGICAL SHOE/BOOT DAILY AT ALL TIMES. IF SUPPLIED, WEAR HEEL PROTECTORS AT ALL TIMES WHEN IN BED.  7. DO NOT WALK BAREFOOT!!!  8.  IF YOU EXPERIENCE ANY FEVER, CHILLS, NIGHTSWEATS, NAUSEA OR VOMITING, ELEVATED OR LOW BLOOD SUGARS, REPORT TO EMERGENCY ROOM.  9. IF YOU EXPERIENCE INCREASED REDNESS, PAIN, SWELLING, DISCOLORATION, ODOR, PUS, DRAINAGE OR WARMTH OF YOUR FOOT, REPORT TO EMERGENCY ROOM.

## 2022-05-27 ENCOUNTER — Encounter: Payer: Self-pay | Admitting: Podiatry

## 2022-05-31 ENCOUNTER — Other Ambulatory Visit: Payer: Self-pay | Admitting: Internal Medicine

## 2022-05-31 ENCOUNTER — Other Ambulatory Visit
Admission: RE | Admit: 2022-05-31 | Discharge: 2022-05-31 | Disposition: A | Discharge status: Home / Self Care | Payer: Medicare Other | Attending: Internal Medicine | Admitting: Internal Medicine

## 2022-05-31 ENCOUNTER — Ambulatory Visit: Payer: Medicare Other | Attending: Internal Medicine | Admitting: Internal Medicine

## 2022-05-31 ENCOUNTER — Encounter: Payer: Self-pay | Admitting: Internal Medicine

## 2022-05-31 ENCOUNTER — Telehealth: Payer: Self-pay | Admitting: Physician Assistant

## 2022-05-31 VITALS — BP 126/82 | HR 67 | Ht 64.5 in | Wt 246.0 lb

## 2022-05-31 DIAGNOSIS — E785 Hyperlipidemia, unspecified: Secondary | ICD-10-CM | POA: Diagnosis not present

## 2022-05-31 DIAGNOSIS — I200 Unstable angina: Secondary | ICD-10-CM | POA: Insufficient documentation

## 2022-05-31 DIAGNOSIS — I10 Essential (primary) hypertension: Secondary | ICD-10-CM | POA: Diagnosis not present

## 2022-05-31 DIAGNOSIS — E1169 Type 2 diabetes mellitus with other specified complication: Secondary | ICD-10-CM | POA: Insufficient documentation

## 2022-05-31 DIAGNOSIS — I5033 Acute on chronic diastolic (congestive) heart failure: Secondary | ICD-10-CM | POA: Insufficient documentation

## 2022-05-31 LAB — BASIC METABOLIC PANEL
Anion gap: 10 (ref 5–15)
BUN: 14 mg/dL (ref 8–23)
CO2: 22 mmol/L (ref 22–32)
Calcium: 9.1 mg/dL (ref 8.9–10.3)
Chloride: 110 mmol/L (ref 98–111)
Creatinine, Ser: 1.07 mg/dL — ABNORMAL HIGH (ref 0.44–1.00)
GFR, Estimated: 54 mL/min — ABNORMAL LOW (ref >60)
Glucose, Bld: 127 mg/dL — ABNORMAL HIGH (ref 70–99)
Potassium: 4.7 mmol/L (ref 3.5–5.1)
Sodium: 142 mmol/L (ref 135–145)

## 2022-05-31 LAB — CBC
HCT: 39.2 % (ref 36.0–46.0)
Hemoglobin: 12.7 g/dL (ref 12.0–15.0)
MCH: 29.9 pg (ref 26.0–34.0)
MCHC: 32.4 g/dL (ref 30.0–36.0)
MCV: 92.2 fL (ref 80.0–100.0)
Platelets: 306 10*3/uL (ref 150–400)
RBC: 4.25 MIL/uL (ref 3.87–5.11)
RDW: 15.6 % — ABNORMAL HIGH (ref 11.5–15.5)
WBC: 10.7 10*3/uL — ABNORMAL HIGH (ref 4.0–10.5)
nRBC: 0 % (ref 0.0–0.2)

## 2022-05-31 LAB — BRAIN NATRIURETIC PEPTIDE: B Natriuretic Peptide: 111.6 pg/mL — ABNORMAL HIGH (ref 0.0–100.0)

## 2022-05-31 NOTE — Telephone Encounter (Signed)
I just spoke with the patient via telephone. She is undergoing bilateral heart catheterization with Dr. Saunders Revel on Monday. She reiterated that her diabetes has become more difficult to control in the past several months.  We discussed that I think her urinary symptoms are multifactorial, with her diabetes and HFpEF are very likely contributing. That said, with her diabetic neuropathy I do think it's possible that her sacral nerve has become overstimulated, leading to decreased efficacy of her InterStim device.  I recommend a 2-week InterStim holiday followed by a trial of P4 on cycling mode to avoid further overstimulation. She understands her urinary symptoms may worsen while InterStim is off. She prefers to defer turning it off until after her cardiac procedure early next week, which is reasonable.  She will contact our office early next week following her procedure to discuss next steps. I offered her an office visit to turn off her device; alternatively she may turn it off herself at home and follow up with me 2 weeks later for programming. She will let us know what she prefers.

## 2022-05-31 NOTE — Patient Instructions (Signed)
Medication Instructions:  Your Physician recommend you continue on your current medication as directed.    *If you need a refill on your cardiac medications before your next appointment, please call your pharmacy*   Lab Work: Your provider would like for you to have the following labs drawn today: (CBC, BMP, BNP).   Please go to the [facility] entrance and check in at the front desk.  You do not need an appointment.  They are open from 7am-6 pm.  You do not need to be fasting.  If you have labs (blood work) drawn today and your tests are completely normal, you will receive your results only by: [place] (if you have MyChart) OR A paper copy in the mail If you have any lab test that is abnormal or we need to change your treatment, we will call you to review the results.   Testing/Procedures: Your physician has requested that you have a cardiac catheterization. Cardiac catheterization is used to diagnose and/or treat various heart conditions. Doctors may recommend this procedure for a number of different reasons. The most common reason is to evaluate chest pain. Chest pain can be a symptom of coronary artery disease (CAD), and cardiac catheterization can show whether plaque is narrowing or blocking your heart's arteries. This procedure is also used to evaluate the valves, as well as measure the blood flow and oxygen levels in different parts of your heart. For further information please visit [email or web address]. Please follow instruction sheet, as given. [facility]   Follow-Up: At [facility], you and your health needs are our priority.  As part of our continuing mission to provide you with exceptional heart care, we have created designated Provider Care Teams.  These Care Teams include your primary Cardiologist (physician) and Advanced Practice Providers (APPs -  Physician Assistants and Nurse Practitioners) who all work together to provide you with the care  you need, when you need it.  We recommend signing up for the patient portal called "MyChart".  Sign up information is provided on this After Visit Summary.  MyChart is used to connect with patients for Virtual Visits (Telemedicine).  Patients are able to view lab/test results, encounter notes, upcoming appointments, etc.  Non-urgent messages can be sent to your provider as well.   To learn more about what you can do with MyChart, go to [email or web address].    Your next appointment:   3 week(s)  The format for your next appointment:   In Person  Provider:   You may see [name], MD or one of the following Advanced Practice Providers on your designated Care Team:   Murray Hodgkins, NP Christell Faith, PA-C Cadence Kathlen Mody, PA-C Gerrie Nordmann, NP      [place] A DEPT OF [name] DEPT OF [place]. [facility] [place], [place] [id number] Dept: [place]: [place]  05/31/2022  You are scheduled for a Cardiac Catheterization on Monday, November 20 with Dr. Harrell Gave End.  1. Please arrive at the [facility] (Main Entrance A) at [facility]: [place], [place] [address] at 8:30 AM (This time is two hours before your procedure to ensure your preparation). Free valet parking service is available.   Special note: Every effort is made to have your procedure done on time. Please understand that emergencies sometimes delay scheduled procedures.  2. Diet: Do not  eat solid foods after midnight.  The patient may have clear liquids until 5am upon the day of the procedure.  3. Labs: You will need to have blood drawn on today (CBC, BMP, BNP) 4. Medication instructions in preparation for your procedure:   Contrast Allergy: No   Take only 1/2 units of insulin the night before your procedure. Do not take any insulin on the day of the  procedure.    On the morning of your procedure, take your Aspirin 81 mg and any morning medicines NOT listed above.  You may use sips of water.  5. Plan for one night stay--bring personal belongings. 6. Bring a current list of your medications and current insurance cards. 7. You MUST have a responsible person to drive you home. 8. Someone MUST be with you the first 24 hours after you arrive home or your discharge will be delayed. 9. Please wear clothes that are easy to get on and off and wear slip-on shoes.  Thank you for allowing [place] to care for you!   -- [facility]

## 2022-05-31 NOTE — Telephone Encounter (Signed)
Pt stopped by office and wanted to let Sam know there's been no improvement w/bladder control.  Dr End, may want her to start fluid pills again and she is having a cardiac cath on Monday in [place].  If you need to talk to her, [phone]

## 2022-05-31 NOTE — Progress Notes (Signed)
Follow-up Outpatient Visit Date: 05/31/2022  Primary Care Provider: [name], MD [place] [address]  Chief Complaint: "Everything is failing"  HPI:  Denise Sharp is a 76 y.o. female with history of CAD status post PCI to the ostial and mid LAD in the setting of unstable angina in 12/2019, chronic HFpEF, hypertension, type 2 diabetes mellitus, iron deficiency anemia following gastric bypass x2, irritable bowel syndrome, esophageal stenosis, chronic dizziness with gait instability, cervicogenic headache with cervical spine stenosis, obstructive sleep apnea on CPAP, restless leg syndrome, and adrenal mass, who presents for follow-up of coronary artery disease and HFpEF.  She was last seen in our office in September by Tarri Glenn, [place], at which time she complained of continued chest pain despite having been transition from isosorbide mononitrate to ranolazine in July due to lightheadedness.  She also complained of worsening hearing with preceding brain MRI showing chronic small vessel ischemic changes in the cerebral white matter and pons, which had progressed from the prior study in 2011.  She has subsequently seen neurology and underwent event monitoring that showed no evidence of atrial fibrillation/flutter but several episodes of NSVT lasting up to 11 beats.  Today, Denise Sharp has innumerable complaints including intermittent chest pain with activity and rest, progressive shortness of breath, progressive weight gain, cough (she is concerned that this may represent fluid retention), urinary incontinence, pain in her hands, numbness and tingling in arms, headaches, and myalgias.  She feels like everything is getting worse.  She has not been taking any torsemide for several weeks because she is now incontinent almost all the time.  She is worried that torsemide might worsen this.  She has gained almost 20 pounds in the last few weeks on account of this.  She is  trying to work with her urologist to see if the bladder stimulator can be adjusted to help with this, though she has not had any success thus far.  --------------------------------------------------------------------------------------------------  Past Medical History:  Diagnosis Date   (HFpEF) heart failure with preserved ejection fraction ([place])    a. 12/2019 Echo: EF 65-70%, Gr2 DD. No significant valvular dzs.   Abnormality of gait 03/25/2013   Adrenal mass, left (HCC)    Anginal pain (HCC)    Anxiety    Aortic atherosclerosis (HCC)    Arthritis    Asthma    CAD (coronary artery disease) with h/o Atypical Chest Pain    a. 04/1986 Cath ([facility]): nl cors, EF 65%; b. 07/2012 Cath [facility]): Diff minor irregs; c. 07/2016 MV [facility]): "Equivocal"; d. 08/2016 Cardiac CT Ca2+ score [facility]): Ca2+ 6213; e. 05/2019 MV: No ischemia. EF 75%; f. 12/2019 PCI: LM nl, LAD 65ost (3.5x12 Resolute DES), 11m(2.75x12 Resolute DES),LCX/RCA nl; g. 08/2020 Cath: patent LAD stents, RCA 40ost, elev RH pressures, EF >65%.   Cervical spinal stenosis 1994   due to trauma to back ([employer] accident), has intermittent paralysis and parasthesias   Cervicogenic headache 03/23/2014   CKD (chronic kidney disease), stage III (HCC)    Depression    Diverticulosis    Dizziness    a.) chronic   DJD (degenerative joint disease)    a. Chronic R shoulder pain   Dyspnea    Esophageal stenosis 09/2009   a.) transient outlet obstruction by food, cleared by EGD   Family history of adverse reaction to anesthesia    a.) daughter with (+) PONV   Gastric bypass status for obesity    GERD (gastroesophageal  reflux disease)    Headache(784.0)    HLD (hyperlipidemia)    Hypertension    IBS (irritable bowel syndrome)    IDA (iron deficiency anemia)    a.) post 2 unit txfsn 2009, normal endo/colonoscopy by [facility]   Left bundle branch block (LBBB)    a.) Intermittently present - likely rate related.   NSVT (nonsustained ventricular tachycardia)  ([place]) 11/15/2020   a.) Holter study 11/15/2020; 11 episodes of NSVT lasting up to 8 beats with a maximum rate of 210 bpm; 14 atrial runs lasting up to 18 beats with a rate of up to 250 bpm.  Some atrial runs felt to be NSVT.   NSVT (nonsustained ventricular tachycardia) ([place])    a. 10/2020 [facility]: 11 runs of NSVT up to 8 beats.   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 10/2020 [facility]: 14 atrial runs up to 18 beats, max HR 250.   Restless leg syndrome    Rotator cuff arthropathy, right 08/13/2017   Syncope and collapse 03/12/2014   Type II diabetes mellitus ([place])    Past Surgical History:  Procedure Laterality Date   APPENDECTOMY     BACK SURGERY     CHOLECYSTECTOMY N/A 1976   COLONOSCOPY     CORONARY ANGIOPLASTY     CORONARY STENT INTERVENTION N/A 01/04/2020   Procedure: CORONARY STENT INTERVENTION;  Surgeon: [name], MD;  Location: [facility];  Service: Cardiovascular;  Laterality: N/A;  LAD    DIAPHRAGMATIC HERNIA REPAIR  2015   ESOPHAGEAL DILATION     multiple   ESOPHAGOGASTRODUODENOSCOPY (EGD) WITH PROPOFOL N/A 05/11/2021   Procedure: ESOPHAGOGASTRODUODENOSCOPY (EGD) WITH PROPOFOL;  Surgeon: [name], MD;  Location: [facility];  Service: Endoscopy;  Laterality: N/A;   ESOPHAGOGASTRODUODENOSCOPY (EGD) WITH PROPOFOL N/A 06/13/2021   Procedure: ESOPHAGOGASTRODUODENOSCOPY (EGD) WITH PROPOFOL;  Surgeon: [name], MD;  Location: [facility];  Service: Endoscopy;  Laterality: N/A;   EYE SURGERY     GASTRIC BYPASS  2000, 2005   Dr. [name] IMPLANT PLACEMENT  10/2011   Hollice Gong IMPLANT PLACEMENT N/A 09/18/2021   Procedure: Barrie Lyme IMPLANT FIRST STAGE;  Surgeon: Bjorn Loser, MD;  Location: [facility];  Service: Urology;  Laterality: N/A;   INTERSTIM IMPLANT PLACEMENT N/A 09/18/2021    Procedure: Barrie Lyme IMPLANT SECOND STAGE WITH IMPEDENCE CHECK;  Surgeon: Bjorn Loser, MD;  Location: [facility];  Service: Urology;  Laterality: N/A;   INTERSTIM IMPLANT REMOVAL N/A 09/18/2021   Procedure: REMOVAL OF INTERSTIM IMPLANT;  Surgeon: Bjorn Loser, MD;  Location: [facility];  Service: Urology;  Laterality: N/A;   LEFT HEART CATH AND CORONARY ANGIOGRAPHY N/A 04/1986   LEFT HEART CATH AND CORONARY ANGIOGRAPHY Left 07/24/2012   LEFT HEART CATH AND CORS/GRAFTS ANGIOGRAPHY N/A 12/31/2019   Procedure: LEFT HEART CATH AND CORS/GRAFTS ANGIOGRAPHY;  Surgeon: [name], MD;  Location: [place] [facility];  Service: Cardiovascular;  Laterality: N/A;   PANNICULECTOMY N/A 06/16/2019   Procedure: PANNICULECTOMY;  Surgeon: [name], MD;  Location: [facility];  Service: Plastics;  Laterality: N/A;  3 hours, please   REVERSE SHOULDER ARTHROPLASTY Right 08/13/2017   Procedure: REVERSE RIGHT SHOULDER ARTHROPLASTY;  Surgeon: [name], MD;  Location: [place];  Service: Orthopedics;  Laterality: Right;   RIGHT/LEFT HEART CATH AND CORONARY ANGIOGRAPHY N/A 09/06/2020  Procedure: RIGHT/LEFT HEART CATH AND CORONARY ANGIOGRAPHY;  Surgeon: [name], MD;  Location: [place] [facility];  Service: Cardiovascular;  Laterality: N/A;   ROTATOR CUFF REPAIR Right    [place] EXTRACTION  11/13/2021   TOTAL ABDOMINAL HYSTERECTOMY W/ BILATERAL SALPINGOOPHORECTOMY  1974   TOTAL KNEE ARTHROPLASTY Bilateral 2007   Procedure: TOTAL KNEE ARTHROPLASTY; Surgeons: Mauri Pole, MD and [name], MD   UMBILICAL HERNIA REPAIR  08/11/2015    Current Meds  Medication Sig   albuterol (VENTOLIN HFA) 108 (90 Base) MCG/ACT inhaler Inhale 1-2 puffs into the lungs every 6 (six) hours as needed for wheezing or shortness of breath.   aspirin EC 81 MG tablet Take 81 mg by mouth daily. Swallow whole.   atorvastatin (LIPITOR) 40 MG tablet Take 2 tablets (80 mg total) by mouth daily.    benzonatate (TESSALON) 200 MG capsule Take 1 capsule (200 mg total) by mouth 2 (two) times daily as needed for cough.   butalbital-acetaminophen-caffeine (FIORICET) 50-325-40 MG tablet Take 1 tablet by mouth every 6 (six) hours as needed for headache.   clotrimazole-betamethasone (LOTRISONE) cream Apply 1 application topically 2 (two) times daily as needed (irritation).   conjugated estrogens (PREMARIN) vaginal cream Apply one pea-sized amount around the opening of the urethra daily for 2 weeks, then 3 times weekly moving forward.   Continuous Blood Gluc Receiver (FREESTYLE LIBRE 2 READER) DEVI Use to check sugar at least TID   Continuous Blood Gluc Sensor (FREESTYLE LIBRE 2 SENSOR) MISC 1 application  by Does not apply route every 14 (fourteen) days.   cyanocobalamin (,VITAMIN B-12,) 1000 MCG/ML injection Inject 1 mL (1,000 mcg total) into the muscle every 30 (thirty) days. Inject 1 ml (1000 mcg ) IM weekly x 4,  Then monthly thereafter   dapagliflozin propanediol (FARXIGA) 10 MG TABS tablet Take 1 tablet (10 mg total) by mouth daily.   dicyclomine (BENTYL) 20 MG tablet Take 1 tablet (20 mg total) by mouth 3 (three) times daily before meals.   diphenoxylate-atropine (LOMOTIL) 2.5-0.025 MG tablet Take 1 tablet by mouth 4 (four) times daily as needed for diarrhea or loose stools.   fexofenadine (ALLEGRA) 180 MG tablet Take 180 mg by mouth as needed for allergies or rhinitis.   fluticasone (FLONASE) 50 MCG/ACT nasal spray Place 2 sprays into both nostrils daily.   glucose blood (ONETOUCH VERIO) test strip Use to check blood sugar twice daily.   insulin aspart (NOVOLOG FLEXPEN) 100 UNIT/ML FlexPen Inject 4-16 units with meals per sliding scale: If pre-meal glucose <100, inject 4 units; 100-150 - inject 6 units, 151-200 - inject 8 units; 201-250 - inject 10 units; 251-300 - inject 12 units, 301-350 - inject 14 units ; 350 > inject 16 units   insulin degludec (TRESIBA FLEXTOUCH) 100 UNIT/ML FlexTouch Pen  Inject 20 Units into the skin daily.   Insulin Pen Needle (DROPLET PEN NEEDLES) 31G X 5 MM MISC USE ONE NEEDLE SUBCUTANEOUSLY AS DIRECTED (REMOVE AND DISCARD NEEDLE IN SHARPS CONTAINER IMMEDIATELY AFTER USE)   Multiple Vitamins-Minerals (CENTRUM SILVER 50+WOMEN PO) Take 1 tablet by mouth daily.   nitroGLYCERIN (NITROSTAT) 0.4 MG SL tablet Place 1 tablet (0.4 mg total) under the tongue every 5 (five) minutes as needed for chest pain.   nystatin (MYCOSTATIN/NYSTOP) powder Apply 1 g topically 4 (four) times daily as needed (rash).   ondansetron (ZOFRAN-ODT) 4 MG disintegrating tablet Take 1 tablet (4 mg total) by mouth every 8 (eight) hours  as needed for nausea or vomiting.   OneTouch Delica Lancets 87F MISC Used to check blood sugar two times a day.   OVER THE COUNTER MEDICATION Apply 1 application topically daily as needed (pain). [place] Gel topical pain reliever   pantoprazole (PROTONIX) 40 MG tablet Take 1 tablet (40 mg total) by mouth daily. MUST SCHEDULE OFFICE VIST   potassium chloride SA (KLOR-CON M) 20 MEQ tablet Take 1 tablet (20 mEq total) by mouth daily.   pregabalin (LYRICA) 50 MG capsule TAKE 1 CAPSULE BY MOUTH 2 TIMES DAILY.   ranolazine (RANEXA) 1000 MG SR tablet Take 1 tablet (1,000 mg total) by mouth 2 (two) times daily.   sertraline (ZOLOFT) 25 MG tablet TAKE 1 TABLET (25 MG TOTAL) BY MOUTH DAILY.   torsemide (DEMADEX) 20 MG tablet Take 20 mg by mouth daily. Take 1 tablet daily. Take an additional tablet for swelling or shortness of breath    Allergies: Demeclocycline, Erythromycin, Flagyl [metronidazole], Glucophage [metformin hcl], Tetracyclines & related, Metformin, Diovan [valsartan], Sulfa antibiotics, and Xanax [alprazolam]  Social History   Tobacco Use   Smoking status: Never    Passive exposure: Never   Smokeless tobacco: Never  Vaping Use   Vaping Use: Never used  Substance Use Topics   Alcohol use: No   Drug use: Not Currently    Comment: prescribed valium     Family History  Problem Relation Age of Onset   Heart disease Father    Hypertension Father    Prostate cancer Father    Stroke Father    Osteoporosis Father    Stroke Mother    Depression Mother    Headache Mother    Heart disease Mother    Thyroid disease Mother    Hypertension Mother    Diabetes Daughter    Heart disease Daughter    Hypertension Daughter    Hypertension Son     Review of Systems: A 12-system review of systems was performed and was negative except as noted in the HPI.  --------------------------------------------------------------------------------------------------  Physical Exam: BP 126/82 (BP Location: Right Arm, Patient Position: Sitting, Cuff Size: Normal)   Pulse 67   Ht 5' 4.5" (1.638 m)   Wt 246 lb (111.6 kg)   SpO2 97%   BMI 41.57 kg/m   General:  NAD.  Seated in a wheelchair and accompanied by her husband. Neck: Unable to assess JVP due to body habitus. Lungs: Mildly diminished breath sounds throughout without wheezes or crackles.. Heart: Distant heart sounds.  Regular rate and rhythm without murmurs, rubs, or gallops. Abdomen: Obese but soft.  Nontender. Extremities: Trace edema bilaterally.  EKG:  Normal sinus rhythm with low voltage.  Otherwise, no significant abnormality.  Lab Results  Component Value Date   WBC 11.3 (H) 01/05/2022   HGB 12.8 01/05/2022   HCT 39.6 01/05/2022   MCV 91.9 01/05/2022   PLT 334 01/05/2022    Lab Results  Component Value Date   NA 137 04/03/2022   K 4.4 04/03/2022   CL 104 04/03/2022   CO2 22 04/03/2022   BUN 21 04/03/2022   CREATININE 1.56 (H) 04/03/2022   GLUCOSE 331 (H) 04/03/2022   ALT 11 02/09/2022    Lab Results  Component Value Date   CHOL 111 02/09/2022   HDL 49.10 02/09/2022   LDLCALC 41 02/09/2022   LDLDIRECT 111.0 06/16/2015   TRIG 103.0 02/09/2022   CHOLHDL 2 02/09/2022     --------------------------------------------------------------------------------------------------  ASSESSMENT AND PLAN: Coronary artery disease with accelerating  angina: Ms. Jose has a long history of chest pains that many years.  She underwent PCI to the LAD in 12/2019, though it is unclear that this provided her with much relief of her symptoms.  She has undergone subsequent catheterization and 2 stress tests, none of which showed any recurrent ischemia.  Her catheterization in 2022 was notable for severely elevated right heart pressures and mildly elevated left heart/PA pressures.  Given that she is now having significant pain at rest, we discussed further evaluation/treatment options including medical therapy, repeat noninvasive ischemia testing, and catheterization.  She would like for [place] to be as aggressive as possible and has agreed to move forward with right and left heart catheterization with possible PCI next week.  We will continue current medications for secondary prevention.  Acute on chronic HFpEF: I suspect Denise Sharp's symptoms are also driven in part by her HFpEF and progressive weight gain that is likely due to fluid retention in the setting of noncompliance with her diuretic therapy.  We will check a BMP and BNP today, given that she had some AKI in September.  Based on results, we will likely need to restart torsemide pending right and left heart catheterization next week.  I acknowledged her concerns regarding her incontinence, though ultimately this seems to be present regardless of if she is taking diuretics or not.  She was previously evaluated in the heart failure clinic; it was felt that her symptoms were primarily driven by deconditioning and morbid obesity.  If her symptoms do not improve and catheterization does not reveal any new findings to explain her symptoms and offer therapeutic targets, I think it would be reasonable to consider consultation with a palliative  care specialist.  Hypertension: Blood pressure well controlled today.  Hyperlipidemia associated with type 2 diabetes mellitus: LDL well controlled on last check in July.  Continue atorvastatin.  Ongoing management of DM per [name].  Shared Decision Making/Informed Consent The risks [stroke (1 in 1000), death (1 in 1000), kidney failure [usually temporary] (1 in 500), bleeding (1 in 200), allergic reaction [possibly serious] (1 in 200)], benefits (diagnostic support and management of coronary artery disease) and alternatives of a cardiac catheterization were discussed in detail with Denise Sharp and she is willing to proceed.  Follow-up: Return to clinic in 3 weeks.  [name], MD 05/31/2022 10:12 AM

## 2022-05-31 NOTE — H&P (View-Only) (Signed)
Follow-up Outpatient Visit Date: 05/31/2022  Primary Care Provider: [name], MD [place] [address]  Chief Complaint: "Everything is failing"  HPI:  Denise Sharp is a 76 y.o. female with history of CAD status post PCI to the ostial and mid LAD in the setting of unstable angina in 12/2019, chronic HFpEF, hypertension, type 2 diabetes mellitus, iron deficiency anemia following gastric bypass x2, irritable bowel syndrome, esophageal stenosis, chronic dizziness with gait instability, cervicogenic headache with cervical spine stenosis, obstructive sleep apnea on CPAP, restless leg syndrome, and adrenal mass, who presents for follow-up of coronary artery disease and HFpEF.  She was last seen in our office in September by Tarri Glenn, [place], at which time she complained of continued chest pain despite having been transition from isosorbide mononitrate to ranolazine in July due to lightheadedness.  She also complained of worsening hearing with preceding brain MRI showing chronic small vessel ischemic changes in the cerebral white matter and pons, which had progressed from the prior study in 2011.  She has subsequently seen neurology and underwent event monitoring that showed no evidence of atrial fibrillation/flutter but several episodes of NSVT lasting up to 11 beats.  Today, Denise Sharp has innumerable complaints including intermittent chest pain with activity and rest, progressive shortness of breath, progressive weight gain, cough (she is concerned that this may represent fluid retention), urinary incontinence, pain in her hands, numbness and tingling in arms, headaches, and myalgias.  She feels like everything is getting worse.  She has not been taking any torsemide for several weeks because she is now incontinent almost all the time.  She is worried that torsemide might worsen this.  She has gained almost 20 pounds in the last few weeks on account of this.  She is  trying to work with her urologist to see if the bladder stimulator can be adjusted to help with this, though she has not had any success thus far.  --------------------------------------------------------------------------------------------------  Past Medical History:  Diagnosis Date   (HFpEF) heart failure with preserved ejection fraction ([facility])    a. 12/2019 Echo: EF 65-70%, Gr2 DD. No significant valvular dzs.   Abnormality of gait 03/25/2013   Adrenal mass, left (HCC)    Anginal pain (HCC)    Anxiety    Aortic atherosclerosis (HCC)    Arthritis    Asthma    CAD (coronary artery disease) with h/o Atypical Chest Pain    a. 04/1986 Cath ([facility]): nl cors, EF 65%; b. 07/2012 Cath [facility]): Diff minor irregs; c. 07/2016 MV [facility]): "Equivocal"; d. 08/2016 Cardiac CT Ca2+ score [facility]): Ca2+ 2440; e. 05/2019 MV: No ischemia. EF 75%; f. 12/2019 PCI: LM nl, LAD 65ost (3.5x12 Resolute DES), 7m(2.75x12 Resolute DES),LCX/RCA nl; g. 08/2020 Cath: patent LAD stents, RCA 40ost, elev RH pressures, EF >65%.   Cervical spinal stenosis 1994   due to trauma to back ([employer] accident), has intermittent paralysis and parasthesias   Cervicogenic headache 03/23/2014   CKD (chronic kidney disease), stage III (HCC)    Depression    Diverticulosis    Dizziness    a.) chronic   DJD (degenerative joint disease)    a. Chronic R shoulder pain   Dyspnea    Esophageal stenosis 09/2009   a.) transient outlet obstruction by food, cleared by EGD   Family history of adverse reaction to anesthesia    a.) daughter with (+) PONV   Gastric bypass status for obesity    GERD (gastroesophageal  reflux disease)    Headache(784.0)    HLD (hyperlipidemia)    Hypertension    IBS (irritable bowel syndrome)    IDA (iron deficiency anemia)    a.) post 2 unit txfsn 2009, normal endo/colonoscopy by [facility]   Left bundle branch block (LBBB)    a.) Intermittently present - likely rate related.   NSVT (nonsustained ventricular tachycardia)  ([place]) 11/15/2020   a.) Holter study 11/15/2020; 11 episodes of NSVT lasting up to 8 beats with a maximum rate of 210 bpm; 14 atrial runs lasting up to 18 beats with a rate of up to 250 bpm.  Some atrial runs felt to be NSVT.   NSVT (nonsustained ventricular tachycardia) ([place])    a. 10/2020 [facility]: 11 runs of NSVT up to 8 beats.   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 10/2020 [facility]: 14 atrial runs up to 18 beats, max HR 250.   Restless leg syndrome    Rotator cuff arthropathy, right 08/13/2017   Syncope and collapse 03/12/2014   Type II diabetes mellitus ([place])    Past Surgical History:  Procedure Laterality Date   APPENDECTOMY     BACK SURGERY     CHOLECYSTECTOMY N/A 1976   COLONOSCOPY     CORONARY ANGIOPLASTY     CORONARY STENT INTERVENTION N/A 01/04/2020   Procedure: CORONARY STENT INTERVENTION;  Surgeon: [name], MD;  Location: [place] [facility];  Service: Cardiovascular;  Laterality: N/A;  LAD    DIAPHRAGMATIC HERNIA REPAIR  2015   ESOPHAGEAL DILATION     multiple   ESOPHAGOGASTRODUODENOSCOPY (EGD) WITH PROPOFOL N/A 05/11/2021   Procedure: ESOPHAGOGASTRODUODENOSCOPY (EGD) WITH PROPOFOL;  Surgeon: [name], MD;  Location: [facility];  Service: Endoscopy;  Laterality: N/A;   ESOPHAGOGASTRODUODENOSCOPY (EGD) WITH PROPOFOL N/A 06/13/2021   Procedure: ESOPHAGOGASTRODUODENOSCOPY (EGD) WITH PROPOFOL;  Surgeon: [name], MD;  Location: [facility];  Service: Endoscopy;  Laterality: N/A;   EYE SURGERY     GASTRIC BYPASS  2000, 2005   Dr. [name] IMPLANT PLACEMENT  10/2011   Hollice Gong IMPLANT PLACEMENT N/A 09/18/2021   Procedure: Barrie Lyme IMPLANT FIRST STAGE;  Surgeon: Bjorn Loser, MD;  Location: [facility];  Service: Urology;  Laterality: N/A;   INTERSTIM IMPLANT PLACEMENT N/A 09/18/2021    Procedure: Barrie Lyme IMPLANT SECOND STAGE WITH IMPEDENCE CHECK;  Surgeon: Bjorn Loser, MD;  Location: [facility];  Service: Urology;  Laterality: N/A;   INTERSTIM IMPLANT REMOVAL N/A 09/18/2021   Procedure: REMOVAL OF INTERSTIM IMPLANT;  Surgeon: Bjorn Loser, MD;  Location: [facility];  Service: Urology;  Laterality: N/A;   LEFT HEART CATH AND CORONARY ANGIOGRAPHY N/A 04/1986   LEFT HEART CATH AND CORONARY ANGIOGRAPHY Left 07/24/2012   LEFT HEART CATH AND CORS/GRAFTS ANGIOGRAPHY N/A 12/31/2019   Procedure: LEFT HEART CATH AND CORS/GRAFTS ANGIOGRAPHY;  Surgeon: [name], MD;  Location: [place] [facility];  Service: Cardiovascular;  Laterality: N/A;   PANNICULECTOMY N/A 06/16/2019   Procedure: PANNICULECTOMY;  Surgeon: [name], MD;  Location: [place];  Service: Plastics;  Laterality: N/A;  3 hours, please   REVERSE SHOULDER ARTHROPLASTY Right 08/13/2017   Procedure: REVERSE RIGHT SHOULDER ARTHROPLASTY;  Surgeon: [name], MD;  Location: [place] [place];  Service: Orthopedics;  Laterality: Right;   RIGHT/LEFT HEART CATH AND CORONARY ANGIOGRAPHY N/A 09/06/2020  Procedure: RIGHT/LEFT HEART CATH AND CORONARY ANGIOGRAPHY;  Surgeon: [name], MD;  Location: [place] [facility];  Service: Cardiovascular;  Laterality: N/A;   ROTATOR CUFF REPAIR Right    [place] EXTRACTION  11/13/2021   TOTAL ABDOMINAL HYSTERECTOMY W/ BILATERAL SALPINGOOPHORECTOMY  1974   TOTAL KNEE ARTHROPLASTY Bilateral 2007   Procedure: TOTAL KNEE ARTHROPLASTY; Surgeons: Mauri Pole, MD and [name], MD   UMBILICAL HERNIA REPAIR  08/11/2015    Current Meds  Medication Sig   albuterol (VENTOLIN HFA) 108 (90 Base) MCG/ACT inhaler Inhale 1-2 puffs into the lungs every 6 (six) hours as needed for wheezing or shortness of breath.   aspirin EC 81 MG tablet Take 81 mg by mouth daily. Swallow whole.   atorvastatin (LIPITOR) 40 MG tablet Take 2 tablets (80 mg total) by mouth daily.    benzonatate (TESSALON) 200 MG capsule Take 1 capsule (200 mg total) by mouth 2 (two) times daily as needed for cough.   butalbital-acetaminophen-caffeine (FIORICET) 50-325-40 MG tablet Take 1 tablet by mouth every 6 (six) hours as needed for headache.   clotrimazole-betamethasone (LOTRISONE) cream Apply 1 application topically 2 (two) times daily as needed (irritation).   conjugated estrogens (PREMARIN) vaginal cream Apply one pea-sized amount around the opening of the urethra daily for 2 weeks, then 3 times weekly moving forward.   Continuous Blood Gluc Receiver (FREESTYLE LIBRE 2 READER) DEVI Use to check sugar at least TID   Continuous Blood Gluc Sensor (FREESTYLE LIBRE 2 SENSOR) MISC 1 application  by Does not apply route every 14 (fourteen) days.   cyanocobalamin (,VITAMIN B-12,) 1000 MCG/ML injection Inject 1 mL (1,000 mcg total) into the muscle every 30 (thirty) days. Inject 1 ml (1000 mcg ) IM weekly x 4,  Then monthly thereafter   dapagliflozin propanediol (FARXIGA) 10 MG TABS tablet Take 1 tablet (10 mg total) by mouth daily.   dicyclomine (BENTYL) 20 MG tablet Take 1 tablet (20 mg total) by mouth 3 (three) times daily before meals.   diphenoxylate-atropine (LOMOTIL) 2.5-0.025 MG tablet Take 1 tablet by mouth 4 (four) times daily as needed for diarrhea or loose stools.   fexofenadine (ALLEGRA) 180 MG tablet Take 180 mg by mouth as needed for allergies or rhinitis.   fluticasone (FLONASE) 50 MCG/ACT nasal spray Place 2 sprays into both nostrils daily.   glucose blood (ONETOUCH VERIO) test strip Use to check blood sugar twice daily.   insulin aspart (NOVOLOG FLEXPEN) 100 UNIT/ML FlexPen Inject 4-16 units with meals per sliding scale: If pre-meal glucose <100, inject 4 units; 100-150 - inject 6 units, 151-200 - inject 8 units; 201-250 - inject 10 units; 251-300 - inject 12 units, 301-350 - inject 14 units ; 350 > inject 16 units   insulin degludec (TRESIBA FLEXTOUCH) 100 UNIT/ML FlexTouch Pen  Inject 20 Units into the skin daily.   Insulin Pen Needle (DROPLET PEN NEEDLES) 31G X 5 MM MISC USE ONE NEEDLE SUBCUTANEOUSLY AS DIRECTED (REMOVE AND DISCARD NEEDLE IN SHARPS CONTAINER IMMEDIATELY AFTER USE)   Multiple Vitamins-Minerals (CENTRUM SILVER 50+WOMEN PO) Take 1 tablet by mouth daily.   nitroGLYCERIN (NITROSTAT) 0.4 MG SL tablet Place 1 tablet (0.4 mg total) under the tongue every 5 (five) minutes as needed for chest pain.   nystatin (MYCOSTATIN/NYSTOP) powder Apply 1 g topically 4 (four) times daily as needed (rash).   ondansetron (ZOFRAN-ODT) 4 MG disintegrating tablet Take 1 tablet (4 mg total) by mouth every 8 (eight) hours  as needed for nausea or vomiting.   OneTouch Delica Lancets 85U MISC Used to check blood sugar two times a day.   OVER THE COUNTER MEDICATION Apply 1 application topically daily as needed (pain). [place] Gel topical pain reliever   pantoprazole (PROTONIX) 40 MG tablet Take 1 tablet (40 mg total) by mouth daily. MUST SCHEDULE OFFICE VIST   potassium chloride SA (KLOR-CON M) 20 MEQ tablet Take 1 tablet (20 mEq total) by mouth daily.   pregabalin (LYRICA) 50 MG capsule TAKE 1 CAPSULE BY MOUTH 2 TIMES DAILY.   ranolazine (RANEXA) 1000 MG SR tablet Take 1 tablet (1,000 mg total) by mouth 2 (two) times daily.   sertraline (ZOLOFT) 25 MG tablet TAKE 1 TABLET (25 MG TOTAL) BY MOUTH DAILY.   torsemide (DEMADEX) 20 MG tablet Take 20 mg by mouth daily. Take 1 tablet daily. Take an additional tablet for swelling or shortness of breath    Allergies: Demeclocycline, Erythromycin, Flagyl [metronidazole], Glucophage [metformin hcl], Tetracyclines & related, Metformin, Diovan [valsartan], Sulfa antibiotics, and Xanax [alprazolam]  Social History   Tobacco Use   Smoking status: Never    Passive exposure: Never   Smokeless tobacco: Never  Vaping Use   Vaping Use: Never used  Substance Use Topics   Alcohol use: No   Drug use: Not Currently    Comment: prescribed valium     Family History  Problem Relation Age of Onset   Heart disease Father    Hypertension Father    Prostate cancer Father    Stroke Father    Osteoporosis Father    Stroke Mother    Depression Mother    Headache Mother    Heart disease Mother    Thyroid disease Mother    Hypertension Mother    Diabetes Daughter    Heart disease Daughter    Hypertension Daughter    Hypertension Son     Review of Systems: A 12-system review of systems was performed and was negative except as noted in the HPI.  --------------------------------------------------------------------------------------------------  Physical Exam: BP 126/82 (BP Location: Right Arm, Patient Position: Sitting, Cuff Size: Normal)   Pulse 67   Ht 5' 4.5" (1.638 m)   Wt 246 lb (111.6 kg)   SpO2 97%   BMI 41.57 kg/m   General:  NAD.  Seated in a wheelchair and accompanied by her husband. Neck: Unable to assess JVP due to body habitus. Lungs: Mildly diminished breath sounds throughout without wheezes or crackles.. Heart: Distant heart sounds.  Regular rate and rhythm without murmurs, rubs, or gallops. Abdomen: Obese but soft.  Nontender. Extremities: Trace edema bilaterally.  EKG:  Normal sinus rhythm with low voltage.  Otherwise, no significant abnormality.  Lab Results  Component Value Date   WBC 11.3 (H) 01/05/2022   HGB 12.8 01/05/2022   HCT 39.6 01/05/2022   MCV 91.9 01/05/2022   PLT 334 01/05/2022    Lab Results  Component Value Date   NA 137 04/03/2022   K 4.4 04/03/2022   CL 104 04/03/2022   CO2 22 04/03/2022   BUN 21 04/03/2022   CREATININE 1.56 (H) 04/03/2022   GLUCOSE 331 (H) 04/03/2022   ALT 11 02/09/2022    Lab Results  Component Value Date   CHOL 111 02/09/2022   HDL 49.10 02/09/2022   LDLCALC 41 02/09/2022   LDLDIRECT 111.0 06/16/2015   TRIG 103.0 02/09/2022   CHOLHDL 2 02/09/2022     --------------------------------------------------------------------------------------------------  ASSESSMENT AND PLAN: Coronary artery disease with accelerating  angina: Ms. Spaugh has a long history of chest pains that many years.  She underwent PCI to the LAD in 12/2019, though it is unclear that this provided her with much relief of her symptoms.  She has undergone subsequent catheterization and 2 stress tests, none of which showed any recurrent ischemia.  Her catheterization in 2022 was notable for severely elevated right heart pressures and mildly elevated left heart/PA pressures.  Given that she is now having significant pain at rest, we discussed further evaluation/treatment options including medical therapy, repeat noninvasive ischemia testing, and catheterization.  She would like for [place] to be as aggressive as possible and has agreed to move forward with right and left heart catheterization with possible PCI next week.  We will continue current medications for secondary prevention.  Acute on chronic HFpEF: I suspect Ms. Mauss's symptoms are also driven in part by her HFpEF and progressive weight gain that is likely due to fluid retention in the setting of noncompliance with her diuretic therapy.  We will check a BMP and BNP today, given that she had some AKI in September.  Based on results, we will likely need to restart torsemide pending right and left heart catheterization next week.  I acknowledged her concerns regarding her incontinence, though ultimately this seems to be present regardless of if she is taking diuretics or not.  She was previously evaluated in the heart failure clinic; it was felt that her symptoms were primarily driven by deconditioning and morbid obesity.  If her symptoms do not improve and catheterization does not reveal any new findings to explain her symptoms and offer therapeutic targets, I think it would be reasonable to consider consultation with a palliative  care specialist.  Hypertension: Blood pressure well controlled today.  Hyperlipidemia associated with type 2 diabetes mellitus: LDL well controlled on last check in July.  Continue atorvastatin.  Ongoing management of DM per [name].  Shared Decision Making/Informed Consent The risks [stroke (1 in 1000), death (1 in 1000), kidney failure [usually temporary] (1 in 500), bleeding (1 in 200), allergic reaction [possibly serious] (1 in 200)], benefits (diagnostic support and management of coronary artery disease) and alternatives of a cardiac catheterization were discussed in detail with Ms. Sharlett Iles and she is willing to proceed.  Follow-up: Return to clinic in 3 weeks.  [name], MD 05/31/2022 10:12 AM

## 2022-06-01 ENCOUNTER — Encounter: Payer: Self-pay | Admitting: Internal Medicine

## 2022-06-01 NOTE — Telephone Encounter (Signed)
Refilled: 01/29/2022 Last OV: 02/06/2022 Next OV: 06/18/2022

## 2022-06-04 ENCOUNTER — Other Ambulatory Visit: Payer: Self-pay | Admitting: Internal Medicine

## 2022-06-04 ENCOUNTER — Encounter (HOSPITAL_COMMUNITY)
Admission: RE | Disposition: A | Discharge status: Home / Self Care | Payer: Self-pay | Source: Home / Self Care | Attending: Internal Medicine

## 2022-06-04 ENCOUNTER — Other Ambulatory Visit: Payer: Self-pay

## 2022-06-04 ENCOUNTER — Ambulatory Visit (HOSPITAL_COMMUNITY)
Admission: RE | Admit: 2022-06-04 | Discharge: 2022-06-04 | Disposition: A | Discharge status: Home / Self Care | Payer: Medicare Other | Attending: Internal Medicine | Admitting: Internal Medicine

## 2022-06-04 DIAGNOSIS — E1169 Type 2 diabetes mellitus with other specified complication: Secondary | ICD-10-CM | POA: Diagnosis not present

## 2022-06-04 DIAGNOSIS — I130 Hypertensive heart and chronic kidney disease with heart failure and stage 1 through stage 4 chronic kidney disease, or unspecified chronic kidney disease: Secondary | ICD-10-CM | POA: Diagnosis not present

## 2022-06-04 DIAGNOSIS — Z6841 Body Mass Index (BMI) 40.0 and over, adult: Secondary | ICD-10-CM | POA: Insufficient documentation

## 2022-06-04 DIAGNOSIS — I25110 Atherosclerotic heart disease of native coronary artery with unstable angina pectoris: Secondary | ICD-10-CM | POA: Diagnosis not present

## 2022-06-04 DIAGNOSIS — N183 Chronic kidney disease, stage 3 unspecified: Secondary | ICD-10-CM | POA: Diagnosis not present

## 2022-06-04 DIAGNOSIS — E785 Hyperlipidemia, unspecified: Secondary | ICD-10-CM | POA: Insufficient documentation

## 2022-06-04 DIAGNOSIS — Z79899 Other long term (current) drug therapy: Secondary | ICD-10-CM | POA: Diagnosis not present

## 2022-06-04 DIAGNOSIS — Z794 Long term (current) use of insulin: Secondary | ICD-10-CM | POA: Insufficient documentation

## 2022-06-04 DIAGNOSIS — Z955 Presence of coronary angioplasty implant and graft: Secondary | ICD-10-CM | POA: Insufficient documentation

## 2022-06-04 DIAGNOSIS — E1122 Type 2 diabetes mellitus with diabetic chronic kidney disease: Secondary | ICD-10-CM | POA: Insufficient documentation

## 2022-06-04 DIAGNOSIS — I200 Unstable angina: Secondary | ICD-10-CM

## 2022-06-04 DIAGNOSIS — I25119 Atherosclerotic heart disease of native coronary artery with unspecified angina pectoris: Secondary | ICD-10-CM | POA: Insufficient documentation

## 2022-06-04 DIAGNOSIS — I5033 Acute on chronic diastolic (congestive) heart failure: Secondary | ICD-10-CM | POA: Insufficient documentation

## 2022-06-04 HISTORY — PX: RIGHT/LEFT HEART CATH AND CORONARY ANGIOGRAPHY: CATH118266

## 2022-06-04 LAB — GLUCOSE, CAPILLARY
Glucose-Capillary: 109 mg/dL — ABNORMAL HIGH (ref 70–99)
Glucose-Capillary: 121 mg/dL — ABNORMAL HIGH (ref 70–99)
Glucose-Capillary: 119 mg/dL — ABNORMAL HIGH (ref 70–99)

## 2022-06-04 LAB — POCT I-STAT EG7
Acid-base deficit: 2 mmol/L (ref 0.0–2.0)
Bicarbonate: 23.2 mmol/L (ref 20.0–28.0)
Calcium, Ion: 1.17 mmol/L (ref 1.15–1.40)
HCT: 35 % — ABNORMAL LOW (ref 36.0–46.0)
Hemoglobin: 11.9 g/dL — ABNORMAL LOW (ref 12.0–15.0)
O2 Saturation: 68 %
Potassium: 4.4 mmol/L (ref 3.5–5.1)
Sodium: 142 mmol/L (ref 135–145)
TCO2: 24 mmol/L (ref 22–32)
pCO2, Ven: 41.3 mmHg — ABNORMAL LOW (ref 44–60)
pH, Ven: 7.357 (ref 7.25–7.43)
pO2, Ven: 37 mmHg (ref 32–45)

## 2022-06-04 LAB — POCT I-STAT 7, (LYTES, BLD GAS, ICA,H+H)
Acid-base deficit: 2 mmol/L (ref 0.0–2.0)
Bicarbonate: 22.5 mmol/L (ref 20.0–28.0)
Calcium, Ion: 1.17 mmol/L (ref 1.15–1.40)
HCT: 35 % — ABNORMAL LOW (ref 36.0–46.0)
Hemoglobin: 11.9 g/dL — ABNORMAL LOW (ref 12.0–15.0)
O2 Saturation: 97 %
Potassium: 4.4 mmol/L (ref 3.5–5.1)
Sodium: 141 mmol/L (ref 135–145)
TCO2: 24 mmol/L (ref 22–32)
pCO2 arterial: 38 mmHg (ref 32–48)
pH, Arterial: 7.381 (ref 7.35–7.45)
pO2, Arterial: 89 mmHg (ref 83–108)

## 2022-06-04 SURGERY — RIGHT/LEFT HEART CATH AND CORONARY ANGIOGRAPHY
Anesthesia: LOCAL

## 2022-06-04 MED ORDER — FENTANYL CITRATE (PF) 100 MCG/2ML IJ SOLN
INTRAMUSCULAR | Status: DC | PRN
  Administered 2022-06-04: 25 ug via INTRAVENOUS

## 2022-06-04 MED ORDER — METOPROLOL SUCCINATE ER 25 MG PO TB24: 25.0000 mg | ORAL_TABLET | Freq: Every day | ORAL | 0 refills | Status: DC

## 2022-06-04 MED ORDER — HEPARIN (PORCINE) IN NACL 1000-0.9 UT/500ML-% IV SOLN
INTRAVENOUS | Status: AC
  Filled 2022-06-04: qty 500

## 2022-06-04 MED ORDER — IOHEXOL 350 MG/ML SOLN
INTRAVENOUS | Status: DC | PRN
  Administered 2022-06-04: 30 mL

## 2022-06-04 MED ORDER — MIDAZOLAM HCL 2 MG/2ML IJ SOLN
INTRAMUSCULAR | Status: AC
  Filled 2022-06-04: qty 2

## 2022-06-04 MED ORDER — SODIUM CHLORIDE 0.9 % IV SOLN: 250.0000 mL | INTRAVENOUS | Status: DC | PRN

## 2022-06-04 MED ORDER — MIDAZOLAM HCL 2 MG/2ML IJ SOLN
INTRAMUSCULAR | Status: DC | PRN
  Administered 2022-06-04: 1 mg via INTRAVENOUS

## 2022-06-04 MED ORDER — HEPARIN SODIUM (PORCINE) 1000 UNIT/ML IJ SOLN
INTRAMUSCULAR | Status: DC | PRN
  Administered 2022-06-04: 5000 [IU] via INTRAVENOUS

## 2022-06-04 MED ORDER — LIDOCAINE HCL (PF) 1 % IJ SOLN
INTRAMUSCULAR | Status: DC | PRN
  Administered 2022-06-04 (×2): 2 mL

## 2022-06-04 MED ORDER — SODIUM CHLORIDE 0.9% FLUSH: 3.0000 mL | INTRAVENOUS | Status: DC | PRN

## 2022-06-04 MED ORDER — SODIUM CHLORIDE 0.9% FLUSH: 3.0000 mL | Freq: Two times a day (BID) | INTRAVENOUS | Status: DC

## 2022-06-04 MED ORDER — HYDRALAZINE HCL 20 MG/ML IJ SOLN: 10.0000 mg | INTRAMUSCULAR | Status: DC | PRN

## 2022-06-04 MED ORDER — ONDANSETRON HCL 4 MG/2ML IJ SOLN: 4.0000 mg | Freq: Four times a day (QID) | INTRAMUSCULAR | Status: DC | PRN

## 2022-06-04 MED ORDER — HEPARIN SODIUM (PORCINE) 1000 UNIT/ML IJ SOLN
INTRAMUSCULAR | Status: AC
  Filled 2022-06-04: qty 10

## 2022-06-04 MED ORDER — VERAPAMIL HCL 2.5 MG/ML IV SOLN
INTRAVENOUS | Status: DC | PRN
  Administered 2022-06-04: 10 mL via INTRA_ARTERIAL

## 2022-06-04 MED ORDER — HEPARIN (PORCINE) IN NACL 1000-0.9 UT/500ML-% IV SOLN
INTRAVENOUS | Status: DC | PRN
  Administered 2022-06-04 (×2): 500 mL

## 2022-06-04 MED ORDER — ASPIRIN 81 MG PO CHEW: 81.0000 mg | CHEWABLE_TABLET | ORAL | Status: DC

## 2022-06-04 MED ORDER — METOPROLOL SUCCINATE ER 25 MG PO TB24: 25.0000 mg | ORAL_TABLET | Freq: Every day | ORAL | 3 refills | Status: DC

## 2022-06-04 MED ORDER — ACETAMINOPHEN 325 MG PO TABS: 650.0000 mg | ORAL_TABLET | ORAL | Status: DC | PRN

## 2022-06-04 MED ORDER — SODIUM CHLORIDE 0.9 % IV SOLN: INTRAVENOUS | Status: DC

## 2022-06-04 MED ORDER — VERAPAMIL HCL 2.5 MG/ML IV SOLN
INTRAVENOUS | Status: AC
  Filled 2022-06-04: qty 2

## 2022-06-04 MED ORDER — LIDOCAINE HCL (PF) 1 % IJ SOLN
INTRAMUSCULAR | Status: AC
  Filled 2022-06-04: qty 30

## 2022-06-04 MED ORDER — FENTANYL CITRATE (PF) 100 MCG/2ML IJ SOLN
INTRAMUSCULAR | Status: AC
  Filled 2022-06-04: qty 2

## 2022-06-04 MED ORDER — VERAPAMIL HCL 2.5 MG/ML IV SOLN: INTRA_ARTERIAL | Status: DC | PRN

## 2022-06-04 MED ORDER — TORSEMIDE 20 MG PO TABS: 20.0000 mg | ORAL_TABLET | Freq: Every day | ORAL | Status: DC | PRN

## 2022-06-04 SURGICAL SUPPLY — 15 items
CATH INFINITI JR4 5F (CATHETERS) IMPLANT
CATH LAUNCHER 5F EBU3.0 (CATHETERS) IMPLANT
CATH SWAN GANZ 7F STRAIGHT (CATHETERS) IMPLANT
CATHETER LAUNCHER 5F EBU3.0 (CATHETERS) ×1
DEVICE RAD COMP TR BAND LRG (VASCULAR PRODUCTS) IMPLANT
GLIDESHEATH SLEND SS 6F .021 (SHEATH) IMPLANT
GLIDESHEATH SLENDER 7FR .021G (SHEATH) IMPLANT
GUIDEWIRE INQWIRE 1.5J.035X260 (WIRE) IMPLANT
INQWIRE 1.5J .035X260CM (WIRE) ×1
KIT ESSENTIALS PG (KITS) IMPLANT
KIT HEART LEFT (KITS) ×2 IMPLANT
PACK CARDIAC CATHETERIZATION (CUSTOM PROCEDURE TRAY) ×2 IMPLANT
SHEATH PROBE COVER 6X72 (BAG) IMPLANT
TRANSDUCER W/STOPCOCK (MISCELLANEOUS) ×2 IMPLANT
TUBING CIL FLEX 10 FLL-RA (TUBING) ×2 IMPLANT

## 2022-06-04 NOTE — Discharge Instructions (Signed)

## 2022-06-04 NOTE — Interval H&P Note (Signed)
History and Physical Interval Note:  06/04/2022 10:42 AM  Denise Sharp  has presented today for surgery, with the diagnosis of acute on chronic heart failure with preserved ejection fraction and accelerating angina.  The various methods of treatment have been discussed with the patient and family. After consideration of risks, benefits and other options for treatment, the patient has consented to  Procedure(s): RIGHT/LEFT HEART CATH AND CORONARY ANGIOGRAPHY (N/A) as a surgical intervention.  The patient's history has been reviewed, patient examined, no change in status, stable for surgery.  I have reviewed the patient's chart and labs.  Questions were answered to the patient's satisfaction.    Cath Lab Visit (complete for each Cath Lab visit)  Clinical Evaluation Leading to the Procedure:   ACS: No.  Non-ACS:    Anginal Classification: CCS IV  Anti-ischemic medical therapy: Minimal Therapy (1 class of medications)  Non-Invasive Test Results: Low-risk stress test findings: cardiac mortality <1%/year  Prior CABG: No previous CABG  Denise Sharp

## 2022-06-05 ENCOUNTER — Encounter (HOSPITAL_COMMUNITY): Payer: Self-pay | Admitting: Internal Medicine

## 2022-06-11 ENCOUNTER — Encounter: Payer: Self-pay | Admitting: Neurology

## 2022-06-11 ENCOUNTER — Ambulatory Visit (INDEPENDENT_AMBULATORY_CARE_PROVIDER_SITE_OTHER): Payer: Medicare Other | Admitting: Neurology

## 2022-06-11 VITALS — BP 142/72 | HR 75

## 2022-06-11 DIAGNOSIS — I679 Cerebrovascular disease, unspecified: Secondary | ICD-10-CM

## 2022-06-11 DIAGNOSIS — R42 Dizziness and giddiness: Secondary | ICD-10-CM

## 2022-06-11 DIAGNOSIS — E1142 Type 2 diabetes mellitus with diabetic polyneuropathy: Secondary | ICD-10-CM

## 2022-06-11 DIAGNOSIS — I200 Unstable angina: Secondary | ICD-10-CM

## 2022-06-11 DIAGNOSIS — R519 Headache, unspecified: Secondary | ICD-10-CM

## 2022-06-11 DIAGNOSIS — G8929 Other chronic pain: Secondary | ICD-10-CM | POA: Diagnosis not present

## 2022-06-11 NOTE — Progress Notes (Signed)
Chief Complaint  Patient presents with   Follow-up    Rm 36, with husband  Reports no changes        ASSESSMENT AND PLAN  Denise Sharp is a 76 y.o. female   Diabetic peripheral neuropathy Near syncope episode OSA, using CPAP Worsening chronic neck pain, headache, light sensitivity,  Her reported syncope episode could due to orthostatic hypotension, evidence of diabetic peripheral neuropathy,  suboptimal control of obstructive sleep apnea deconditioning, cervical muscle tension can contributed to her headache,  MRI of brain and neck showed mild to moderate small vessel disease, cervical spine showed multilevel degenerative changes no significant canal foraminal narrowing  She is on polypharmacy treatment, will try Botox injection as migraine prevention, preauthorization paperwork June 11, 2022  Return to clinic in 3 to 4 weeks for injection   DIAGNOSTIC DATA (LABS, IMAGING, TESTING) - I reviewed patient records, labs, notes, testing and imaging myself where available.   MEDICAL HISTORY:  Denise Sharp, is a 76 year old female, accompanied by her husband Denise Sharp, seen in request by her Denise Sharp care doctor Denise Sharp for evaluation of dizziness episode, she was patient of Denise Sharp in the past,  I reviewed and summarized the referring note. PMHX. HLD DM CAD s/p stent Urinary incontinence,  Gastric Bypass twice,  Bilateral Knee replacement    She has diabetic peripheral neuropathy, unsteady gait, chronic low back pain, limited activity, mostly sedentary, reported few episodes of lightheadedness, near passing out over the past few weeks  Few months ago, she was at [facility] fair, had breakfast, walk few isles, around 2 PM, while she was ready for her lunch, riding on her scooter, she suddenly felt lightheaded, wooziness sensation, difficulty focusing, her husband has to help her sliding down to the chair by the table, did not loss of consciousness, after  eating only small amount of meal, she felt better, glucose level was 170 range,  She reported few episode of near passing out in the past, sometimes related to low glucose,  Personally reviewed MRI of the brain March 04, 2022, mild small vessel disease, no acute abnormality MRI of cervical spine, multilevel degenerative changes, no significant canal stenosis, variable degree of foraminal narrowing Nuclear medicine myocardial perfusion study, normal wall motion, normal ejection fraction, CT imaging showed calcification in the LAD, low risk scan  She did have a history of irregular heart rate in the past,  UPDATE Jun 11 2022: She is accompanied by her husband at today's clinical visit, complains of feeling miserable all the time, moderate daily headache, standing from occipital region, spreading forward, constant moderate, sometimes to the point of light noise sensitivity, has to close her eyes, resting for a while,  Also complains of hearing loss, seen by Denise Sharp, also evaluated for her dizziness, reported no significant vestibular dysfunction  She has progressive worsening diabetic peripheral neuropathy, numbness from knee down, gait abnormality,  Recent cardiac evaluation, cardiac catheter showed no significant coronary artery disease, patent stent,  Cardiac Cath on Jun 04 2022:  Mild-moderate, non-obstructive coronary artery disease, similar to prior catheterization in 08/2020. Widely patent ostial LAD stent. Patent mid LAD stent with minimal in stent restenosis (~10% narrowing). Upper normal left and right heart filling pressures. Borderline elevated pulmonary artery pressure. Normal to mildly reduced cardiac output/index.  PHYSICAL EXAM:   Vitals:   06/11/22 1512  BP: (!) 142/72  Pulse: 75   Sitting down 115/61, HR 73,  Standing up 129/61, HR 81,  standing up xone  minute 134/72, HR 80.  PHYSICAL EXAMNIATION:  Gen: NAD, conversant, well nourised, well groomed                      Cardiovascular: Regular rate rhythm, no peripheral edema, warm, nontender. Eyes: Conjunctivae clear without exudates or hemorrhage Neck: Supple, no carotid bruits. Pulmonary: Clear to auscultation bilaterally   NEUROLOGICAL EXAM:  MENTAL STATUS: Speech/cognition: Depressed looking deconditioned obese elderly female, alert, oriented to history taking and casual conversation CRANIAL NERVES: CN II: Visual fields are full to confrontation. Pupils are round equal and briskly reactive to light. CN III, IV, VI: extraocular movement are normal. No ptosis. CN V: Facial sensation is intact to light touch CN VII: Face is symmetric with normal eye closure  CN VIII: Hearing is normal to causal conversation. CN IX, X: Phonation is normal. CN XI: Head turning and shoulder shrug are intact  MOTOR: Mild bilateral ankle dorsiflexion weakness  REFLEXES: Reflexes are 2+ and symmetric at the biceps, triceps, knees, absent and ankles. Plantar responses are flexor.  Sensory: Length dependent decreased to light touch, pinprick and vibratory sensation to knee level  COORDINATION: There is no trunk or limb dysmetria noted.  GAIT/STANCE: Push-up to get up from seated position, cautious   REVIEW OF SYSTEMS:  Full 14 system review of systems performed and notable only for as above All other review of systems were negative.   ALLERGIES: Allergies  Allergen Reactions   Demeclocycline Hives   Erythromycin Nausea And Vomiting and Other (See Comments)    Severe irritable bowel   Flagyl [Metronidazole] Nausea And Vomiting and Other (See Comments)    Severe irritable bowel   Glucophage [Metformin Hcl] Nausea And Vomiting and Other (See Comments)    "Sick" "I won't take anything that has metformin in it"   Tetracyclines & Related Hives and Rash   Diovan [Valsartan] Nausea Only        Xanax [Alprazolam] Other (See Comments)    Hyperactivity     HOME MEDICATIONS: Current Outpatient Medications   Medication Sig Dispense Refill   albuterol (VENTOLIN HFA) 108 (90 Base) MCG/ACT inhaler Inhale 1-2 puffs into the lungs every 6 (six) hours as needed for wheezing or shortness of breath. 18 g 2   aspirin EC 81 MG tablet Take 81 mg by mouth daily. Swallow whole.     atorvastatin (LIPITOR) 40 MG tablet Take 2 tablets (80 mg total) by mouth daily. 180 tablet 0   benzonatate (TESSALON) 200 MG capsule Take 1 capsule (200 mg total) by mouth 2 (two) times daily as needed for cough. 20 capsule 0   butalbital-acetaminophen-caffeine (FIORICET) 50-325-40 MG tablet Take 1 tablet by mouth every 6 (six) hours as needed for headache. 60 tablet 0   Cholecalciferol (VITAMIN D3) 250 MCG (10000 UT) capsule Take 10,000 Units by mouth daily.     clotrimazole-betamethasone (LOTRISONE) cream Apply 1 application topically 2 (two) times daily as needed (irritation).     conjugated estrogens (PREMARIN) vaginal cream Apply one pea-sized amount around the opening of the urethra daily for 2 weeks, then 3 times weekly moving forward. (Patient taking differently: Apply one pea-sized amount around the opening of the urethra  3 times weekly) 30 g 4   Continuous Blood Gluc Receiver (FREESTYLE LIBRE 2 READER) DEVI Use to check sugar at least TID 1 each 1   Continuous Blood Gluc Sensor (FREESTYLE LIBRE 2 SENSOR) MISC 1 application  by Does not apply route every 14 (fourteen) days. 2  each 0   cyanocobalamin (,VITAMIN B-12,) 1000 MCG/ML injection Inject 1 mL (1,000 mcg total) into the muscle every 30 (thirty) days. Inject 1 ml (1000 mcg ) IM weekly x 4,  Then monthly thereafter 3 mL 1   dapagliflozin propanediol (FARXIGA) 10 MG TABS tablet Take 1 tablet (10 mg total) by mouth daily. 90 tablet 3   dicyclomine (BENTYL) 20 MG tablet Take 1 tablet (20 mg total) by mouth 3 (three) times daily before meals. 90 tablet 0   diphenoxylate-atropine (LOMOTIL) 2.5-0.025 MG tablet Take 1 tablet by mouth 4 (four) times daily as needed for diarrhea or  loose stools. 60 tablet 5   fexofenadine (ALLEGRA) 180 MG tablet Take 180 mg by mouth as needed for allergies or rhinitis.     fluticasone (FLONASE) 50 MCG/ACT nasal spray Place 2 sprays into both nostrils daily. (Patient taking differently: Place 2 sprays into both nostrils 3 (three) times daily as needed for allergies.) 16 g 6   glucose blood (ONETOUCH VERIO) test strip Use to check blood sugar twice daily. 100 each 1   Homeopathic Products (LEG CRAMPS PO) Take 1 tablet by mouth at bedtime as needed (neuropathy pain).     insulin aspart (NOVOLOG FLEXPEN) 100 UNIT/ML FlexPen Inject 4-16 units with meals per sliding scale: If pre-meal glucose <100, inject 4 units; 100-150 - inject 6 units, 151-200 - inject 8 units; 201-250 - inject 10 units; 251-300 - inject 12 units, 301-350 - inject 14 units ; 350 > inject 16 units (Patient taking differently: Inject 4-14 Units into the skin 3 (three) times daily.) 15 mL 3   insulin degludec (TRESIBA FLEXTOUCH) 100 UNIT/ML FlexTouch Pen Inject 20 Units into the skin daily. (Patient taking differently: Inject 10-20 Units into the skin at bedtime.) 15 mL 3   Insulin Pen Needle (DROPLET PEN NEEDLES) 31G X 5 MM MISC USE ONE NEEDLE SUBCUTANEOUSLY AS DIRECTED (REMOVE AND DISCARD NEEDLE IN SHARPS CONTAINER IMMEDIATELY AFTER USE) 100 each 1   loperamide (IMODIUM) 2 MG capsule Take 4-6 mg by mouth 3 (three) times daily before meals.     magnesium gluconate (MAGONATE) 500 MG tablet Take 500 mg by mouth daily.     metoprolol succinate (TOPROL XL) 25 MG 24 hr tablet Take 1 tablet (25 mg total) by mouth daily. 90 tablet 3   nitroGLYCERIN (NITROSTAT) 0.4 MG SL tablet Place 1 tablet (0.4 mg total) under the tongue every 5 (five) minutes as needed for chest pain. 25 tablet 1   nystatin (MYCOSTATIN/NYSTOP) powder Apply 1 g topically 4 (four) times daily as needed (rash).     ondansetron (ZOFRAN-ODT) 4 MG disintegrating tablet Take 1 tablet (4 mg total) by mouth every 8 (eight) hours  as needed for nausea or vomiting. 30 tablet 0   OneTouch Delica Lancets 44W MISC Used to check blood sugar two times a day. 100 each 4   OVER THE COUNTER MEDICATION Apply 1 application topically daily as needed (pain). [place] Gel topical pain reliever     pantoprazole (PROTONIX) 40 MG tablet Take 1 tablet (40 mg total) by mouth daily. MUST SCHEDULE OFFICE VIST 30 tablet 1   Polyethyl Glycol-Propyl Glycol (SYSTANE OP) Place 1 drop into both eyes 4 (four) times daily as needed (dry eyes).     potassium chloride SA (KLOR-CON M) 20 MEQ tablet Take 1 tablet (20 mEq total) by mouth daily. 30 tablet 1   pregabalin (LYRICA) 50 MG capsule TAKE 1 CAPSULE BY MOUTH TWICE A DAY 60 capsule  2   ranolazine (RANEXA) 1000 MG SR tablet Take 1 tablet (1,000 mg total) by mouth 2 (two) times daily. 90 tablet 3   sertraline (ZOLOFT) 25 MG tablet TAKE 1 TABLET (25 MG TOTAL) BY MOUTH DAILY. 90 tablet 1   No current facility-administered medications for this visit.    PAST MEDICAL HISTORY: Past Medical History:  Diagnosis Date   (HFpEF) heart failure with preserved ejection fraction ([place])    a. 12/2019 Echo: EF 65-70%, Gr2 DD. No significant valvular dzs.   Abnormality of gait 03/25/2013   Adrenal mass, left (HCC)    Anginal pain (HCC)    Anxiety    Aortic atherosclerosis (HCC)    Arthritis    Asthma    CAD (coronary artery disease) with h/o Atypical Chest Pain    a. 04/1986 Cath ([facility]): nl cors, EF 65%; b. 07/2012 Cath [facility]): Diff minor irregs; c. 07/2016 MV [facility]): "Equivocal"; d. 08/2016 Cardiac CT Ca2+ score [facility]): Ca2+ 4970; e. 05/2019 MV: No ischemia. EF 75%; f. 12/2019 PCI: LM nl, LAD 65ost (3.5x12 Resolute DES), 63m(2.75x12 Resolute DES),LCX/RCA nl; g. 08/2020 Cath: patent LAD stents, RCA 40ost, elev RH pressures, EF >65%.   Cervical spinal stenosis 1994   due to trauma to back ([employer] accident), has intermittent paralysis and parasthesias   Cervicogenic headache 03/23/2014   CKD (chronic kidney disease),  stage III (HCC)    Depression    Diverticulosis    Dizziness    a.) chronic   DJD (degenerative joint disease)    a. Chronic R shoulder pain   Dyspnea    Esophageal stenosis 09/2009   a.) transient outlet obstruction by food, cleared by EGD   Family history of adverse reaction to anesthesia    a.) daughter with (+) PONV   Gastric bypass status for obesity    GERD (gastroesophageal reflux disease)    Headache(784.0)    HLD (hyperlipidemia)    Hypertension    IBS (irritable bowel syndrome)    IDA (iron deficiency anemia)    a.) post 2 unit txfsn 2009, normal endo/colonoscopy by [facility]   Left bundle branch block (LBBB)    a.) Intermittently present - likely rate related.   NSVT (nonsustained ventricular tachycardia) ([place] 11/15/2020   a.) Holter study 11/15/2020; 11 episodes of NSVT lasting up to 8 beats with a maximum rate of 210 bpm; 14 atrial runs lasting up to 18 beats with a rate of up to 250 bpm.  Some atrial runs felt to be NSVT.   NSVT (nonsustained ventricular tachycardia) ([place]    a. 10/2020 [facility]: 11 runs of NSVT up to 8 beats.   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 10/2020 [facility]: 14 atrial runs up to 18 beats, max HR 250.   Restless leg syndrome    Rotator cuff arthropathy, right 08/13/2017   Syncope and collapse 03/12/2014   Type II diabetes mellitus ([facility]     PAST SURGICAL HISTORY: Past Surgical History:  Procedure Laterality Date   APPENDECTOMY     BACK SURGERY     CHOLECYSTECTOMY N/A 1976   COLONOSCOPY     CORONARY ANGIOPLASTY     CORONARY STENT INTERVENTION N/A 01/04/2020   Procedure: CORONARY STENT INTERVENTION;  Surgeon: AWellington Hampshire MD;  [facility];  Service: Cardiovascular;  Laterality: N/A;  LAD  DIAPHRAGMATIC HERNIA REPAIR  2015   ESOPHAGEAL DILATION     multiple    ESOPHAGOGASTRODUODENOSCOPY (EGD) WITH PROPOFOL N/A 05/11/2021   Procedure: ESOPHAGOGASTRODUODENOSCOPY (EGD) WITH PROPOFOL;  Surgeon: [name], MD;  Location: [facility];  Service: Endoscopy;  Laterality: N/A;   ESOPHAGOGASTRODUODENOSCOPY (EGD) WITH PROPOFOL N/A 06/13/2021   Procedure: ESOPHAGOGASTRODUODENOSCOPY (EGD) WITH PROPOFOL;  Surgeon: [name], MD;  Location: [facility];  Service: Endoscopy;  Laterality: N/A;   EYE SURGERY     GASTRIC BYPASS  2000, 2005   Dr. [name] IMPLANT PLACEMENT  10/2011   Hollice Gong IMPLANT PLACEMENT N/A 09/18/2021   Procedure: Barrie Lyme IMPLANT FIRST STAGE;  Surgeon: Bjorn Loser, MD;  Location: [facility];  Service: Urology;  Laterality: N/A;   INTERSTIM IMPLANT PLACEMENT N/A 09/18/2021   Procedure: Barrie Lyme IMPLANT SECOND STAGE WITH IMPEDENCE CHECK;  Surgeon: Bjorn Loser, MD;  Location: [facility];  Service: Urology;  Laterality: N/A;   INTERSTIM IMPLANT REMOVAL N/A 09/18/2021   Procedure: REMOVAL OF INTERSTIM IMPLANT;  Surgeon: Bjorn Loser, MD;  Location: [facility];  Service: Urology;  Laterality: N/A;   LEFT HEART CATH AND CORONARY ANGIOGRAPHY N/A 04/1986   LEFT HEART CATH AND CORONARY ANGIOGRAPHY Left 07/24/2012   LEFT HEART CATH AND CORS/GRAFTS ANGIOGRAPHY N/A 12/31/2019   Procedure: LEFT HEART CATH AND CORS/GRAFTS ANGIOGRAPHY;  Surgeon: [name], MD;  Location: [place] [facility];  Service: Cardiovascular;  Laterality: N/A;   PANNICULECTOMY N/A 06/16/2019   Procedure: PANNICULECTOMY;  Surgeon: [name], MD;  Location: [place];  Service: Plastics;  Laterality: N/A;  3 hours, please   REVERSE SHOULDER ARTHROPLASTY Right 08/13/2017   Procedure: REVERSE RIGHT SHOULDER ARTHROPLASTY;  Surgeon: [name], MD;  Location: [place];  Service: Orthopedics;  Laterality: Right;   RIGHT/LEFT HEART CATH AND CORONARY ANGIOGRAPHY N/A 09/06/2020   Procedure: RIGHT/LEFT HEART CATH AND CORONARY ANGIOGRAPHY;   Surgeon: [name], MD;  Location: [place] [facility];  Service: Cardiovascular;  Laterality: N/A;   RIGHT/LEFT HEART CATH AND CORONARY ANGIOGRAPHY N/A 06/04/2022   Procedure: RIGHT/LEFT HEART CATH AND CORONARY ANGIOGRAPHY;  Surgeon: [name], MD;  Location: [place] [facility];  Service: Cardiovascular;  Laterality: N/A;   ROTATOR CUFF REPAIR Right    SPINE SURGERY  1995   [place]   TOOTH EXTRACTION  11/13/2021   TOTAL ABDOMINAL HYSTERECTOMY W/ BILATERAL SALPINGOOPHORECTOMY  1974   TOTAL KNEE ARTHROPLASTY Bilateral 2007   Procedure: TOTAL KNEE ARTHROPLASTY; Surgeons: Mauri Pole, MD and [name], MD   UMBILICAL HERNIA REPAIR  08/11/2015    FAMILY HISTORY: Family History  Problem Relation Age of Onset   Heart disease Father    Hypertension Father    Prostate cancer Father    Stroke Father    Osteoporosis Father    Stroke Mother    Depression Mother    Headache Mother    Heart disease Mother    Thyroid disease Mother    Hypertension Mother    Diabetes Daughter    Heart disease Daughter    Hypertension Daughter    Hypertension Son     SOCIAL HISTORY: Social History   Socioeconomic History   Marital status: Married    Spouse name: Denise Sharp    Number of children: 2   Years of education: College    Highest education level: Not on file  Occupational History    Employer: retired  Tobacco Use   Smoking status: Never    Passive exposure: Never   Smokeless  tobacco: Never  Vaping Use   Vaping Use: Never used  Substance and Sexual Activity   Alcohol use: No   Drug use: Not Currently    Comment: prescribed valium   Sexual activity: Not Currently    Birth control/protection: Post-menopausal, Surgical  Other Topics Concern   Not on file  Social History Narrative   Patient lives at home with husband Denise Sharp.    Right Handed   Drinks caffeinated tea occasionally   Social Determinants of Health   Financial Resource Strain: Low Risk  (09/15/2021)   Overall Financial  Resource Strain (CARDIA)    Difficulty of Paying Living Expenses: Not hard at all  Food Insecurity: No Food Insecurity (10/19/2020)   Hunger Vital Sign    Worried About Running Out of Food in the Last Year: Never true    Ran Out of Food in the Last Year: Never true  Transportation Needs: No Transportation Needs (10/19/2020)   PRAPARE - Hydrologist (Medical): No    Lack of Transportation (Non-Medical): No  Physical Activity: Insufficiently Active (11/15/2020)   Exercise Vital Sign    Days of Exercise per Week: 2 days    Minutes of Exercise per Session: 40 min  Stress: Stress Concern Present (08/03/2020)   [place]    Feeling of Stress : Rather much  Social Connections: Not on file  Intimate Partner Violence: Not At Risk (10/19/2020)   Humiliation, Afraid, Rape, and Kick questionnaire    Fear of Current or Ex-Partner: No    Emotionally Abused: No    Physically Abused: No    Sexually Abused: No      Marcial Pacas, M.D. Ph.D.  [facility] [address], [place], [place] [address] Ph: [phone] Fax: [phone]  CC:  Denise Mc, MD [address] [place],  [place] [address]  Denise Mc, MD

## 2022-06-13 ENCOUNTER — Telehealth: Payer: Self-pay

## 2022-06-13 NOTE — Telephone Encounter (Signed)
PA is needed for Botox 200 units Diagnosis code: L41.030

## 2022-06-15 ENCOUNTER — Other Ambulatory Visit (HOSPITAL_COMMUNITY): Payer: Self-pay

## 2022-06-15 ENCOUNTER — Encounter: Payer: Self-pay | Admitting: Internal Medicine

## 2022-06-15 NOTE — Telephone Encounter (Signed)
Per [facility] Prior Authorization not needed for Botox  Buy and [employer]

## 2022-06-18 ENCOUNTER — Ambulatory Visit: Payer: Medicare Other | Admitting: Internal Medicine

## 2022-06-18 NOTE — Telephone Encounter (Signed)
Spoke with pt and pt stated she would like to discuss other treatment options other than botox and would like a call back

## 2022-06-19 NOTE — Telephone Encounter (Signed)
Patient is on polypharmacy treatment already, out of other medication choices, Botox as migraine prevention probably has the least the side effect, it is okay not to proceed per her request,

## 2022-06-19 NOTE — Telephone Encounter (Signed)
MyChart message sent to patient in regards to concerns.

## 2022-06-21 ENCOUNTER — Ambulatory Visit: Payer: Medicare Other | Admitting: Podiatry

## 2022-06-21 ENCOUNTER — Ambulatory Visit: Payer: Medicare Other | Attending: Internal Medicine | Admitting: Internal Medicine

## 2022-06-21 VITALS — BP 140/74 | HR 68 | Ht 64.5 in | Wt 248.0 lb

## 2022-06-21 DIAGNOSIS — E785 Hyperlipidemia, unspecified: Secondary | ICD-10-CM | POA: Insufficient documentation

## 2022-06-21 DIAGNOSIS — E1169 Type 2 diabetes mellitus with other specified complication: Secondary | ICD-10-CM | POA: Diagnosis not present

## 2022-06-21 DIAGNOSIS — I10 Essential (primary) hypertension: Secondary | ICD-10-CM | POA: Diagnosis not present

## 2022-06-21 DIAGNOSIS — I25118 Atherosclerotic heart disease of native coronary artery with other forms of angina pectoris: Secondary | ICD-10-CM | POA: Insufficient documentation

## 2022-06-21 DIAGNOSIS — I5032 Chronic diastolic (congestive) heart failure: Secondary | ICD-10-CM | POA: Insufficient documentation

## 2022-06-21 NOTE — Patient Instructions (Addendum)
Medication Instructions:   STOP TAKING:  Potassium 20 meq daily Ranexa 1000 mg twice a day  *If you need a refill on your cardiac medications before your next appointment, please call your pharmacy*   Lab Work: Please have potassium lab work checked at your next primary care office visit.   Testing/Procedures: None ordered today   Follow-Up: At [facility], you and your health needs are our priority.  As part of our continuing mission to provide you with exceptional heart care, we have created designated Provider Care Teams.  These Care Teams include your primary Cardiologist (physician) and Advanced Practice Providers (APPs -  Physician Assistants and Nurse Practitioners) who all work together to provide you with the care you need, when you need it.  We recommend signing up for the patient portal called "MyChart".  Sign up information is provided on this After Visit Summary.  MyChart is used to connect with patients for Virtual Visits (Telemedicine).  Patients are able to view lab/test results, encounter notes, upcoming appointments, etc.  Non-urgent messages can be sent to your provider as well.   To learn more about what you can do with MyChart, go to [email or web address].    Your next appointment:   3 month(s)  The format for your next appointment:   In Person  Provider:   You may see [name], MD or one of the following Advanced Practice Providers on your designated Care Team:   Murray Hodgkins, NP Christell Faith, PA-C Cadence Kathlen Mody, PA-C Gerrie Nordmann, NP

## 2022-06-21 NOTE — Progress Notes (Signed)
Follow-up Outpatient Visit Date: 06/21/2022  Primary Care Provider: [name], MD [place] [address]  Chief Complaint: Follow-up CAD and HFpEF  HPI:  Ms. Gadway is a 76 y.o. female with history of CAD status post PCI to the ostial and mid LAD in the setting of unstable angina in 12/2019, chronic HFpEF, hypertension, type 2 diabetes mellitus, iron deficiency anemia following gastric bypass x2, irritable bowel syndrome, esophageal stenosis, chronic dizziness with gait instability, cervicogenic headache with cervical spine stenosis, obstructive sleep apnea on CPAP, restless leg syndrome, and adrenal mass, who presents for follow-up of coronary artery disease and chronic HFpEF.  I last saw her about 3 weeks ago, at which time she had multiple complaints including intermittent chest pain, progressive shortness of breath, progressive weight gain, and cough (she attributed this to fluid retention).  We agreed to proceed with right and left heart catheterization, which showed stable mild-moderate nonobstructive CAD with patent LAD stents.  Left and right heart filling pressures were upper normal with normal to mildly reduced cardiac output.  No obvious explanation for the patient's progressive symptoms and recent NSVT was identified.  She was restarted on low-dose metoprolol.  Today, Ms. Stallone reports that she is feeling a bit better.  She has not had any further chest pain since her cath last month.  Breathing is also a little better, though her function capacity remains limited.  She is tolerating low-dose metoprolol well, though she is concerned that a lot of her symptoms may be related to all of the medications that she is taking.  She is hoping to come off as many medications as possible.  She has not had any significant edema, nor palpitations or  lightheadedness.  --------------------------------------------------------------------------------------------------  Past Medical History:  Diagnosis Date   (HFpEF) heart failure with preserved ejection fraction ([place])    a. 12/2019 Echo: EF 65-70%, Gr2 DD. No significant valvular dzs.   Abnormality of gait 03/25/2013   Adrenal mass, left (HCC)    Anginal pain (HCC)    Anxiety    Aortic atherosclerosis (HCC)    Arthritis    Asthma    CAD (coronary artery disease) with h/o Atypical Chest Pain    a. 04/1986 Cath ([facility]): nl cors, EF 65%; b. 07/2012 Cath [facility]): Diff minor irregs; c. 07/2016 MV [facility]): "Equivocal"; d. 08/2016 Cardiac CT Ca2+ score [facility]): Ca2+ 1856; e. 05/2019 MV: No ischemia. EF 75%; f. 12/2019 PCI: LM nl, LAD 65ost (3.5x12 Resolute DES), 64m(2.75x12 Resolute DES),LCX/RCA nl; g. 08/2020 Cath: patent LAD stents, RCA 40ost, elev RH pressures, EF >65%.   Cervical spinal stenosis 1994   due to trauma to back ([employer] accident), has intermittent paralysis and parasthesias   Cervicogenic headache 03/23/2014   CKD (chronic kidney disease), stage III (HCC)    Depression    Diverticulosis    Dizziness    a.) chronic   DJD (degenerative joint disease)    a. Chronic R shoulder pain   Dyspnea    Esophageal stenosis 09/2009   a.) transient outlet obstruction by food, cleared by EGD   Family history of adverse reaction to anesthesia    a.) daughter with (+) PONV   Gastric bypass status for obesity    GERD (gastroesophageal reflux disease)    Headache(784.0)    HLD (hyperlipidemia)    Hypertension    IBS (irritable bowel syndrome)    IDA (iron deficiency anemia)    a.) post 2 unit txfsn 2009, normal  endo/colonoscopy by [facility]   Left bundle branch block (LBBB)    a.) Intermittently present - likely rate related.   NSVT (nonsustained ventricular tachycardia) ([place]) 11/15/2020   a.) Holter study 11/15/2020; 11 episodes of NSVT lasting up to 8 beats with a maximum rate of 210 bpm; 14  atrial runs lasting up to 18 beats with a rate of up to 250 bpm.  Some atrial runs felt to be NSVT.   NSVT (nonsustained ventricular tachycardia) ([place])    a. 10/2020 Z[facility]: 11 runs of NSVT up to 8 beats.   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 10/2020 [facility]: 14 atrial runs up to 18 beats, max HR 250.   Restless leg syndrome    Rotator cuff arthropathy, right 08/13/2017   Syncope and collapse 03/12/2014   Type II diabetes mellitus ([place])    Past Surgical History:  Procedure Laterality Date   APPENDECTOMY     BACK SURGERY     CHOLECYSTECTOMY N/A 1976   COLONOSCOPY     CORONARY ANGIOPLASTY     CORONARY STENT INTERVENTION N/A 01/04/2020   Procedure: CORONARY STENT INTERVENTION;  Surgeon: [name], MD;  Location: [facility];  Service: Cardiovascular;  Laterality: N/A;  LAD    DIAPHRAGMATIC HERNIA REPAIR  2015   ESOPHAGEAL DILATION     multiple   ESOPHAGOGASTRODUODENOSCOPY (EGD) WITH PROPOFOL N/A 05/11/2021   Procedure: ESOPHAGOGASTRODUODENOSCOPY (EGD) WITH PROPOFOL;  Surgeon: [name], MD;  Location: [facility];  Service: Endoscopy;  Laterality: N/A;   ESOPHAGOGASTRODUODENOSCOPY (EGD) WITH PROPOFOL N/A 06/13/2021   Procedure: ESOPHAGOGASTRODUODENOSCOPY (EGD) WITH PROPOFOL;  Surgeon: [name], MD;  Location: [facility];  Service: Endoscopy;  Laterality: N/A;   EYE SURGERY     GASTRIC BYPASS  2000, 2005   Dr. [name] IMPLANT PLACEMENT  10/2011   Hollice Gong IMPLANT PLACEMENT N/A 09/18/2021   Procedure: Barrie Lyme IMPLANT FIRST STAGE;  Surgeon: Bjorn Loser, MD;  Location: [facility];  Service: Urology;  Laterality: N/A;   INTERSTIM IMPLANT PLACEMENT N/A 09/18/2021   Procedure: Barrie Lyme IMPLANT SECOND STAGE WITH IMPEDENCE CHECK;  Surgeon: Bjorn Loser, MD;  Location: [facility];  Service:  Urology;  Laterality: N/A;   INTERSTIM IMPLANT REMOVAL N/A 09/18/2021   Procedure: REMOVAL OF INTERSTIM IMPLANT;  Surgeon: Bjorn Loser, MD;  Location: [facility];  Service: Urology;  Laterality: N/A;   LEFT HEART CATH AND CORONARY ANGIOGRAPHY N/A 04/1986   LEFT HEART CATH AND CORONARY ANGIOGRAPHY Left 07/24/2012   LEFT HEART CATH AND CORS/GRAFTS ANGIOGRAPHY N/A 12/31/2019   Procedure: LEFT HEART CATH AND CORS/GRAFTS ANGIOGRAPHY;  Surgeon: [name], MD;  Location: [facility];  Service: Cardiovascular;  Laterality: N/A;   PANNICULECTOMY N/A 06/16/2019   Procedure: PANNICULECTOMY;  Surgeon: [name], MD;  Location: [facility];  Service: Plastics;  Laterality: N/A;  3 hours, please   REVERSE SHOULDER ARTHROPLASTY Right 08/13/2017   Procedure: REVERSE RIGHT SHOULDER ARTHROPLASTY;  Surgeon: [name], MD;  Location: [facility];  Service: Orthopedics;  Laterality: Right;   RIGHT/LEFT HEART CATH AND CORONARY ANGIOGRAPHY N/A 09/06/2020   Procedure: RIGHT/LEFT HEART CATH AND CORONARY ANGIOGRAPHY;  Surgeon: [name], MD;  Location: [facility];  Service: Cardiovascular;  Laterality: N/A;   RIGHT/LEFT HEART CATH AND CORONARY ANGIOGRAPHY N/A 06/04/2022   Procedure: RIGHT/LEFT  HEART CATH AND CORONARY ANGIOGRAPHY;  Surgeon: [name], MD;  Location: [place];  Service: Cardiovascular;  Laterality: N/A;   ROTATOR CUFF REPAIR Right    [place] EXTRACTION  11/13/2021   TOTAL ABDOMINAL HYSTERECTOMY W/ BILATERAL SALPINGOOPHORECTOMY  1974   TOTAL KNEE ARTHROPLASTY Bilateral 2007   Procedure: TOTAL KNEE ARTHROPLASTY; Surgeons: Mauri Pole, MD and [name], MD   UMBILICAL HERNIA REPAIR  08/11/2015    Current Meds  Medication Sig   albuterol (VENTOLIN HFA) 108 (90 Base) MCG/ACT inhaler Inhale 1-2 puffs into the lungs every 6 (six) hours as needed for wheezing or shortness of breath.   aspirin EC 81 MG tablet Take 81 mg by mouth  daily. Swallow whole.   atorvastatin (LIPITOR) 40 MG tablet Take 2 tablets (80 mg total) by mouth daily.   benzonatate (TESSALON) 200 MG capsule Take 1 capsule (200 mg total) by mouth 2 (two) times daily as needed for cough.   butalbital-acetaminophen-caffeine (FIORICET) 50-325-40 MG tablet Take 1 tablet by mouth every 6 (six) hours as needed for headache.   Cholecalciferol (VITAMIN D3) 250 MCG (10000 UT) capsule Take 10,000 Units by mouth daily.   clotrimazole-betamethasone (LOTRISONE) cream Apply 1 application topically 2 (two) times daily as needed (irritation).   conjugated estrogens (PREMARIN) vaginal cream Apply one pea-sized amount around the opening of the urethra daily for 2 weeks, then 3 times weekly moving forward. (Patient taking differently: Apply one pea-sized amount around the opening of the urethra  3 times weekly)   Continuous Blood Gluc Receiver (FREESTYLE LIBRE 2 READER) DEVI Use to check sugar at least TID   Continuous Blood Gluc Sensor (FREESTYLE LIBRE 2 SENSOR) MISC 1 application  by Does not apply route every 14 (fourteen) days.   cyanocobalamin (,VITAMIN B-12,) 1000 MCG/ML injection Inject 1 mL (1,000 mcg total) into the muscle every 30 (thirty) days. Inject 1 ml (1000 mcg ) IM weekly x 4,  Then monthly thereafter   dapagliflozin propanediol (FARXIGA) 10 MG TABS tablet Take 1 tablet (10 mg total) by mouth daily.   dicyclomine (BENTYL) 20 MG tablet Take 1 tablet (20 mg total) by mouth 3 (three) times daily before meals.   diphenoxylate-atropine (LOMOTIL) 2.5-0.025 MG tablet Take 1 tablet by mouth 4 (four) times daily as needed for diarrhea or loose stools.   fexofenadine (ALLEGRA) 180 MG tablet Take 180 mg by mouth as needed for allergies or rhinitis.   fluticasone (FLONASE) 50 MCG/ACT nasal spray Place 2 sprays into both nostrils daily. (Patient taking differently: Place 2 sprays into both nostrils 3 (three) times daily as needed for allergies.)   glucose blood (ONETOUCH VERIO)  test strip Use to check blood sugar twice daily.   Homeopathic Products (LEG CRAMPS PO) Take 1 tablet by mouth at bedtime as needed (neuropathy pain).   insulin aspart (NOVOLOG FLEXPEN) 100 UNIT/ML FlexPen Inject 4-16 units with meals per sliding scale: If pre-meal glucose <100, inject 4 units; 100-150 - inject 6 units, 151-200 - inject 8 units; 201-250 - inject 10 units; 251-300 - inject 12 units, 301-350 - inject 14 units ; 350 > inject 16 units (Patient taking differently: Inject 4-14 Units into the skin 3 (three) times daily.)   insulin degludec (TRESIBA FLEXTOUCH) 100 UNIT/ML FlexTouch Pen Inject 20 Units into the skin daily. (Patient taking differently: Inject 10-20 Units into the skin at bedtime.)   Insulin Pen Needle (DROPLET PEN NEEDLES) 31G X 5 MM MISC USE ONE  NEEDLE SUBCUTANEOUSLY AS DIRECTED (REMOVE AND DISCARD NEEDLE IN SHARPS CONTAINER IMMEDIATELY AFTER USE)   loperamide (IMODIUM) 2 MG capsule Take 4-6 mg by mouth 3 (three) times daily before meals.   magnesium gluconate (MAGONATE) 500 MG tablet Take 500 mg by mouth daily.   metoprolol succinate (TOPROL XL) 25 MG 24 hr tablet Take 1 tablet (25 mg total) by mouth daily.   nitroGLYCERIN (NITROSTAT) 0.4 MG SL tablet Place 1 tablet (0.4 mg total) under the tongue every 5 (five) minutes as needed for chest pain.   nystatin (MYCOSTATIN/NYSTOP) powder Apply 1 g topically 4 (four) times daily as needed (rash).   ondansetron (ZOFRAN-ODT) 4 MG disintegrating tablet Take 1 tablet (4 mg total) by mouth every 8 (eight) hours as needed for nausea or vomiting.   OneTouch Delica Lancets 08X MISC Used to check blood sugar two times a day.   OVER THE COUNTER MEDICATION Apply 1 application topically daily as needed (pain). [place] Gel topical pain reliever   pantoprazole (PROTONIX) 40 MG tablet Take 1 tablet (40 mg total) by mouth daily. MUST SCHEDULE OFFICE VIST   Polyethyl Glycol-Propyl Glycol (SYSTANE OP) Place 1 drop into both eyes 4 (four) times daily as  needed (dry eyes).   potassium chloride SA (KLOR-CON M) 20 MEQ tablet Take 1 tablet (20 mEq total) by mouth daily.   pregabalin (LYRICA) 50 MG capsule TAKE 1 CAPSULE BY MOUTH TWICE A DAY   ranolazine (RANEXA) 1000 MG SR tablet Take 1 tablet (1,000 mg total) by mouth 2 (two) times daily.   sertraline (ZOLOFT) 25 MG tablet TAKE 1 TABLET (25 MG TOTAL) BY MOUTH DAILY.    Allergies: Demeclocycline, Erythromycin, Flagyl [metronidazole], Glucophage [metformin hcl], Tetracyclines & related, Diovan [valsartan], and Xanax [alprazolam]  Social History   Tobacco Use   Smoking status: Never    Passive exposure: Never   Smokeless tobacco: Never  Vaping Use   Vaping Use: Never used  Substance Use Topics   Alcohol use: No   Drug use: Not Currently    Comment: prescribed valium    Family History  Problem Relation Age of Onset   Heart disease Father    Hypertension Father    Prostate cancer Father    Stroke Father    Osteoporosis Father    Stroke Mother    Depression Mother    Headache Mother    Heart disease Mother    Thyroid disease Mother    Hypertension Mother    Diabetes Daughter    Heart disease Daughter    Hypertension Daughter    Hypertension Son     Review of Systems: A 12-system review of systems was performed and was negative except as noted in the HPI.  --------------------------------------------------------------------------------------------------  Physical Exam: BP (!) 140/74 (BP Location: Left Arm, Patient Position: Sitting, Cuff Size: Normal)   Pulse 68   Ht 5' 4.5" (1.638 m)   Wt 248 lb (112.5 kg)   SpO2 98%   BMI 41.91 kg/m  Repeat BP: 128/70  General:  NAD. Neck: No JVD or HJR, though body habitus limits evaluation. Lungs: Clear to auscultation bilaterally without wheezes or crackles. Heart: Regular rate and rhythm without murmurs, rubs, or gallops. Abdomen: Soft, nontender, nondistended. Extremities: No lower extremity edema.  Right radial and  antecubital cath sites are well-healed.  No hematoma appreciated.  Right radial pulse is 1+.  EKG:  Normal sinus rhythm with artifact.  No significant abnormality.  Lab Results  Component Value Date   WBC 10.7 (  H) 05/31/2022   HGB 11.9 (L) 06/04/2022   HCT 35.0 (L) 06/04/2022   MCV 92.2 05/31/2022   PLT 306 05/31/2022    Lab Results  Component Value Date   NA 141 06/04/2022   K 4.4 06/04/2022   CL 110 05/31/2022   CO2 22 05/31/2022   BUN 14 05/31/2022   CREATININE 1.07 (H) 05/31/2022   GLUCOSE 127 (H) 05/31/2022   ALT 11 02/09/2022    Lab Results  Component Value Date   CHOL 111 02/09/2022   HDL 49.10 02/09/2022   LDLCALC 41 02/09/2022   LDLDIRECT 111.0 06/16/2015   TRIG 103.0 02/09/2022   CHOLHDL 2 02/09/2022    --------------------------------------------------------------------------------------------------  ASSESSMENT AND PLAN: Coronary artery disease with stable angina: Ms. Springsteen is feeling better today without recurrent chest pain.  Recent cath showed non-obstructive CAD with patent LAD stents.  I agree with her that polypharmacy could be contributing to some of her symptoms.  We have agreed to discontinue ranolazine.  Continue aspirin, atorvastatin, and metoprolol for secondary prevention and some antianginal therapy in case she still has a component of microvascular dysfunction.  Chronic HFpEF: Volume assessment is challenging on account of body habitus and limited functional capacity.  However, recent R/LHC hemodynamics did not show significant volume overload.  We will forego adding a standing diuretic in light of this and problems with urinary frequency in the past.  Continue SGLT-2 inhibitor for management of HFpEF and DM.  We will hold potassium supplement given lack of ongoing diuretic therapy.  I encouraged Ms. Kutzer to increase her dietary potassium intake to compensate for this and to discuss a repeat BMP with [name] at their visit later this  month.  Hyperlipidemia with type 2 diabetes mellitus: Continue atorvastatin; ongoing management of DM per [name].  Hypertension: BP mildly elevated on initial check but improved to 128/70 on recheck.  Continue low-dose metoprolol  Morbid obesity: BMI remains > 40.  Weight loss encouraged through diet and exercise, as tolerated.  Follow-up: Return to clinic in 3 months.  [name], MD 06/21/2022 11:03 AM

## 2022-06-22 ENCOUNTER — Encounter: Payer: Self-pay | Admitting: Internal Medicine

## 2022-06-27 NOTE — Telephone Encounter (Signed)
MyChart messgae sent to patient. 

## 2022-07-02 ENCOUNTER — Telehealth: Payer: Self-pay

## 2022-07-02 NOTE — Patient Outreach (Signed)
  Care Coordination   07/02/2022 Name: Denise Sharp MRN: [id number] DOB: Aug 11, 1945   Care Coordination Outreach Attempts:  An unsuccessful telephone outreach was attempted today to offer the patient information about available care coordination services as a benefit of their health plan. Unable to reach or leave voice message.  Phone rang busy  Follow Up Plan:  Additional outreach attempts will be made to offer the patient care coordination information and services.   Encounter Outcome:  No Answer   Care Coordination Interventions:  No, not indicated    Quinn Plowman [facility] [phone] direct line

## 2022-07-05 ENCOUNTER — Ambulatory Visit (INDEPENDENT_AMBULATORY_CARE_PROVIDER_SITE_OTHER): Payer: Medicare Other | Admitting: Podiatry

## 2022-07-05 DIAGNOSIS — B351 Tinea unguium: Secondary | ICD-10-CM

## 2022-07-05 DIAGNOSIS — M79676 Pain in unspecified toe(s): Secondary | ICD-10-CM

## 2022-07-05 DIAGNOSIS — E1149 Type 2 diabetes mellitus with other diabetic neurological complication: Secondary | ICD-10-CM

## 2022-07-12 NOTE — Progress Notes (Addendum)
This patient returns to my office for at risk foot care.  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  Patient is taking plavix..  This patient is unable to cut nails herself since the patient cannot reach her nails.These nails are painful walking and wearing shoes.  This patient presents for at risk foot care today.  General Appearance  Alert, conversant and in no acute stress.  Vascular  Dorsalis pedis and posterior tibial  pulses are palpable  bilaterally.  Capillary return is within normal limits  bilaterally. Temperature is within normal limits  bilaterally.  Neurologic  Senn-Weinstein monofilament wire test absent   bilaterally. Muscle power within normal limits bilaterally.  Nails Thick disfigured discolored nails with subungual debris  from hallux to fifth toes bilaterally. No evidence of bacterial infection or drainage bilaterally.  Orthopedic  No limitations of motion  feet .  No crepitus or effusions noted.  No bony pathology or digital deformities noted. Charcot foot.  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  Skin  normotropic skin with no porokeratosis noted bilaterally.  No signs of infections or ulcers noted.  Laceration second toe right foot.  No bleeding or redness noted.   Onychomycosis  Pain in right toes  Pain in left toes  Consent was obtained for treatment procedures.   Mechanical debridement of nails 1-5  bilaterally performed with a nail nipper.  Filed with dremel without incident.  This was done as a courtesy as patient has required return back before 61 days   Return office visit    3 months                  Told patient to return for periodic foot care and evaluation due to potential at risk complications.   Nicholes Rough D.P.M.

## 2022-07-13 ENCOUNTER — Encounter: Payer: Self-pay | Admitting: Internal Medicine

## 2022-07-13 ENCOUNTER — Ambulatory Visit (INDEPENDENT_AMBULATORY_CARE_PROVIDER_SITE_OTHER): Payer: Medicare Other | Admitting: Internal Medicine

## 2022-07-13 VITALS — BP 160/62 | HR 80 | Temp 97.9°F | Ht 64.5 in | Wt 250.4 lb

## 2022-07-13 DIAGNOSIS — I200 Unstable angina: Secondary | ICD-10-CM

## 2022-07-13 DIAGNOSIS — E1169 Type 2 diabetes mellitus with other specified complication: Secondary | ICD-10-CM

## 2022-07-13 DIAGNOSIS — E785 Hyperlipidemia, unspecified: Secondary | ICD-10-CM

## 2022-07-13 DIAGNOSIS — N183 Chronic kidney disease, stage 3 unspecified: Secondary | ICD-10-CM

## 2022-07-13 DIAGNOSIS — Z794 Long term (current) use of insulin: Secondary | ICD-10-CM | POA: Diagnosis not present

## 2022-07-13 DIAGNOSIS — E1142 Type 2 diabetes mellitus with diabetic polyneuropathy: Secondary | ICD-10-CM | POA: Diagnosis not present

## 2022-07-13 DIAGNOSIS — J989 Respiratory disorder, unspecified: Secondary | ICD-10-CM

## 2022-07-13 DIAGNOSIS — I10 Essential (primary) hypertension: Secondary | ICD-10-CM

## 2022-07-13 DIAGNOSIS — E1140 Type 2 diabetes mellitus with diabetic neuropathy, unspecified: Secondary | ICD-10-CM

## 2022-07-13 DIAGNOSIS — R296 Repeated falls: Secondary | ICD-10-CM | POA: Diagnosis not present

## 2022-07-13 DIAGNOSIS — E1122 Type 2 diabetes mellitus with diabetic chronic kidney disease: Secondary | ICD-10-CM

## 2022-07-13 DIAGNOSIS — Z23 Encounter for immunization: Secondary | ICD-10-CM | POA: Diagnosis not present

## 2022-07-13 DIAGNOSIS — R0609 Other forms of dyspnea: Secondary | ICD-10-CM

## 2022-07-13 MED ORDER — BENZONATATE 200 MG PO CAPS: 200.0000 mg | ORAL_CAPSULE | Freq: Two times a day (BID) | ORAL | 0 refills | Status: DC | PRN

## 2022-07-13 MED ORDER — FREESTYLE LIBRE 2 SENSOR MISC: 1.0000 "application " | 0 refills | Status: DC

## 2022-07-13 MED ORDER — ONDANSETRON 4 MG PO TBDP: 4.0000 mg | ORAL_TABLET | Freq: Three times a day (TID) | ORAL | 0 refills | Status: DC | PRN

## 2022-07-13 MED ORDER — TRESIBA FLEXTOUCH 100 UNIT/ML ~~LOC~~ SOPN: 20.0000 [IU] | PEN_INJECTOR | Freq: Every day | SUBCUTANEOUS | 3 refills | Status: DC

## 2022-07-13 NOTE — Assessment & Plan Note (Signed)
Lung function pretty good, admitting occasional mild cough or wheeze. Bigger factors are obesity, deconditioning and cardiac problemsl Plan- continue Rescue inhaler and Anoro. Walk more and eat less.

## 2022-07-13 NOTE — Assessment & Plan Note (Addendum)
GFR is stable .  Continue Farxiga,  Avoid NSAIDs.   Lab Results  Component Value Date   CREATININE 1.07 (H) 05/31/2022

## 2022-07-13 NOTE — Progress Notes (Addendum)
Subjective:  Patient ID: Denise Sharp, female    DOB: December 20, 1945  Age: 76 y.o. MRN: [id number]  CC: The primary encounter diagnosis was Essential hypertension. Diagnoses of Type 2 diabetes mellitus with diabetic neuropathy, with long-term current use of insulin ([place]), Hyperlipidemia associated with type 2 diabetes mellitus ([place]), Respiratory illness, DM type 2 with diabetic peripheral neuropathy ([place]), CKD stage 3 due to type 2 diabetes mellitus ([place]), Diabetic polyneuropathy associated with type 2 diabetes mellitus ([place]), DOE (dyspnea on exertion), Frequent falls, and Need for immunization against influenza were also pertinent to this visit.   HPI Denise Sharp presents for  Chief Complaint  Patient presents with   Medical Management of Chronic Issues    Diabetes, hypertension   1) T2DM with neuropathy:  she  states that her blood sugars have been ranging from 60 to 90 fasting,  then will hump to 220 without eating anything . She reports compliance with farxiga,  SSI novolog qac 8-10 before each meal. She reports eating very small amounts of food ,  but  Has sweet tea with every meal ,and drinks it throughout the day,  She takes a varying amount of Tresiba,  from 10 to 20 units depending on what her BS is that evening.  She sleeps in occasionally (when she doesn't have a doctor's appt) and skips breakfast.  She does mot exercise due to frequent falls, complicated by her neuropathy   averages 3 hours per day OUT OF BED.   2) Obesity:  gaining weight , 15 lbs since Oct 30.  Frequent falls,  gets dizzy with exertion.   Reviewed choices for exercise.  Has been "kicked out of PT"  etc etc  Has a  recumbent bike but can't keep her feet on the pedal because she has no feeling in her feet   3) s/p cardiac cath Nov 23 LAD stent .  Potassium and Ranexa were stopped 2 weeks ago.     4) OSA :  wearing CPAP "all night long"  Outpatient Medications Prior to Visit  Medication Sig Dispense Refill    aspirin EC 81 MG tablet Take 81 mg by mouth daily. Swallow whole.     atorvastatin (LIPITOR) 40 MG tablet Take 2 tablets (80 mg total) by mouth daily. 180 tablet 0   butalbital-acetaminophen-caffeine (FIORICET) 50-325-40 MG tablet Take 1 tablet by mouth every 6 (six) hours as needed for headache. 60 tablet 0   Cholecalciferol (VITAMIN D3) 250 MCG (10000 UT) capsule Take 10,000 Units by mouth daily.     clotrimazole-betamethasone (LOTRISONE) cream Apply 1 application topically 2 (two) times daily as needed (irritation).     conjugated estrogens (PREMARIN) vaginal cream Apply one pea-sized amount around the opening of the urethra daily for 2 weeks, then 3 times weekly moving forward. (Patient taking differently: Apply one pea-sized amount around the opening of the urethra  3 times weekly) 30 g 4   Continuous Blood Gluc Receiver (FREESTYLE LIBRE 2 READER) DEVI Use to check sugar at least TID 1 each 1   cyanocobalamin (,VITAMIN B-12,) 1000 MCG/ML injection Inject 1 mL (1,000 mcg total) into the muscle every 30 (thirty) days. Inject 1 ml (1000 mcg ) IM weekly x 4,  Then monthly thereafter 3 mL 1   dapagliflozin propanediol (FARXIGA) 10 MG TABS tablet Take 1 tablet (10 mg total) by mouth daily. 90 tablet 3   dicyclomine (BENTYL) 20 MG tablet Take 1 tablet (20 mg total) by mouth 3 (three) times  daily before meals. 90 tablet 0   diphenoxylate-atropine (LOMOTIL) 2.5-0.025 MG tablet Take 1 tablet by mouth 4 (four) times daily as needed for diarrhea or loose stools. 60 tablet 5   fexofenadine (ALLEGRA) 180 MG tablet Take 180 mg by mouth as needed for allergies or rhinitis.     fluticasone (FLONASE) 50 MCG/ACT nasal spray Place 2 sprays into both nostrils daily. (Patient taking differently: Place 2 sprays into both nostrils 3 (three) times daily as needed for allergies.) 16 g 6   glucose blood (ONETOUCH VERIO) test strip Use to check blood sugar twice daily. 100 each 1   Homeopathic Products (LEG CRAMPS PO) Take 1  tablet by mouth at bedtime as needed (neuropathy pain).     insulin aspart (NOVOLOG FLEXPEN) 100 UNIT/ML FlexPen Inject 4-16 units with meals per sliding scale: If pre-meal glucose <100, inject 4 units; 100-150 - inject 6 units, 151-200 - inject 8 units; 201-250 - inject 10 units; 251-300 - inject 12 units, 301-350 - inject 14 units ; 350 > inject 16 units (Patient taking differently: Inject 4-14 Units into the skin 3 (three) times daily.) 15 mL 3   Insulin Pen Needle (DROPLET PEN NEEDLES) 31G X 5 MM MISC USE ONE NEEDLE SUBCUTANEOUSLY AS DIRECTED (REMOVE AND DISCARD NEEDLE IN SHARPS CONTAINER IMMEDIATELY AFTER USE) 100 each 1   loperamide (IMODIUM) 2 MG capsule Take 4-6 mg by mouth 3 (three) times daily before meals.     magnesium gluconate (MAGONATE) 500 MG tablet Take 500 mg by mouth daily.     metoprolol succinate (TOPROL XL) 25 MG 24 hr tablet Take 1 tablet (25 mg total) by mouth daily. 90 tablet 3   nitroGLYCERIN (NITROSTAT) 0.4 MG SL tablet Place 1 tablet (0.4 mg total) under the tongue every 5 (five) minutes as needed for chest pain. 25 tablet 1   nystatin (MYCOSTATIN/NYSTOP) powder Apply 1 g topically 4 (four) times daily as needed (rash).     OneTouch Delica Lancets 96Q MISC Used to check blood sugar two times a day. 100 each 4   OVER THE COUNTER MEDICATION Apply 1 application topically daily as needed (pain). [place] Gel topical pain reliever     pantoprazole (PROTONIX) 40 MG tablet Take 1 tablet (40 mg total) by mouth daily. MUST SCHEDULE OFFICE VIST 30 tablet 1   Polyethyl Glycol-Propyl Glycol (SYSTANE OP) Place 1 drop into both eyes 4 (four) times daily as needed (dry eyes).     pregabalin (LYRICA) 50 MG capsule TAKE 1 CAPSULE BY MOUTH TWICE A DAY 60 capsule 2   sertraline (ZOLOFT) 25 MG tablet TAKE 1 TABLET (25 MG TOTAL) BY MOUTH DAILY. 90 tablet 1   benzonatate (TESSALON) 200 MG capsule Take 1 capsule (200 mg total) by mouth 2 (two) times daily as needed for cough. 20 capsule 0    Continuous Blood Gluc Sensor (FREESTYLE LIBRE 2 SENSOR) MISC 1 application  by Does not apply route every 14 (fourteen) days. 2 each 0   insulin degludec (TRESIBA FLEXTOUCH) 100 UNIT/ML FlexTouch Pen Inject 20 Units into the skin daily. (Patient taking differently: Inject 10-20 Units into the skin at bedtime.) 15 mL 3   ondansetron (ZOFRAN-ODT) 4 MG disintegrating tablet Take 1 tablet (4 mg total) by mouth every 8 (eight) hours as needed for nausea or vomiting. 30 tablet 0   albuterol (VENTOLIN HFA) 108 (90 Base) MCG/ACT inhaler Inhale 1-2 puffs into the lungs every 6 (six) hours as needed for wheezing or shortness of breath. (Patient  not taking: Reported on 07/13/2022) 18 g 2   No facility-administered medications prior to visit.    Review of Systems;  Patient denies headache, fevers, malaise, unintentional weight loss, skin rash, eye pain, sinus congestion and sinus pain, sore throat, dysphagia,  hemoptysis , cough, dyspnea, wheezing, chest pain, palpitations, orthopnea, edema, abdominal pain, nausea, melena, diarrhea, constipation, flank pain, dysuria, hematuria, urinary  Frequency, nocturia, numbness, tingling, seizures,  Focal weakness, Loss of consciousness,  Tremor, insomnia, depression, anxiety, and suicidal ideation.      Objective:  BP (!) 160/62   Pulse 80   Temp 97.9 F (36.6 C) (Oral)   Ht 5' 4.5" (1.638 m)   Wt 250 lb 6.4 oz (113.6 kg)   SpO2 96%   BMI 42.32 kg/m   BP Readings from Last 3 Encounters:  07/13/22 (!) 160/62  06/21/22 (!) 140/74  06/11/22 (!) 142/72    Wt Readings from Last 3 Encounters:  07/13/22 250 lb 6.4 oz (113.6 kg)  06/21/22 248 lb (112.5 kg)  06/04/22 238 lb 3.2 oz (108 kg)    Physical Exam Vitals reviewed.  Constitutional:      General: She is not in acute distress.    Appearance: Normal appearance. She is obese. She is not ill-appearing, toxic-appearing or diaphoretic.  HENT:     Head: Normocephalic.  Eyes:     General: No scleral  icterus.       Right eye: No discharge.        Left eye: No discharge.     Conjunctiva/sclera: Conjunctivae normal.  Cardiovascular:     Rate and Rhythm: Normal rate and regular rhythm.     Heart sounds: Normal heart sounds.  Pulmonary:     Effort: Pulmonary effort is normal. No respiratory distress.     Breath sounds: Normal breath sounds.  Musculoskeletal:        General: Normal range of motion.  Skin:    General: Skin is warm and dry.  Neurological:     Mental Status: She is alert and oriented to person, place, and time. Mental status is at baseline.     Sensory: Sensory deficit present.     Gait: Gait abnormal.     Comments: Waddling gait  Psychiatric:        Mood and Affect: Mood normal.        Behavior: Behavior normal.        Thought Content: Thought content normal.        Judgment: Judgment normal.     Lab Results  Component Value Date   HGBA1C 7.1 (H) 07/13/2022   HGBA1C 7.7 (H) 02/09/2022   HGBA1C 7.4 (H) 11/21/2021    Lab Results  Component Value Date   CREATININE 1.14 (H) 07/13/2022   CREATININE 1.07 (H) 05/31/2022   CREATININE 1.56 (H) 04/03/2022    Lab Results  Component Value Date   WBC 10.7 (H) 05/31/2022   HGB 11.9 (L) 06/04/2022   HCT 35.0 (L) 06/04/2022   PLT 306 05/31/2022   GLUCOSE 117 (H) 07/13/2022   CHOL 201 (H) 07/13/2022   TRIG 246 (H) 07/13/2022   HDL 50 07/13/2022   LDLDIRECT 126 (H) 07/13/2022   LDLCALC 114 (H) 07/13/2022   ALT 7 07/13/2022   AST 11 07/13/2022   NA 140 07/13/2022   K 4.4 07/13/2022   CL 106 07/13/2022   CREATININE 1.14 (H) 07/13/2022   BUN 14 07/13/2022   CO2 20 07/13/2022   TSH 2.209 02/08/2021   INR  1.1 12/31/2019   HGBA1C 7.1 (H) 07/13/2022   MICROALBUR <0.7 11/21/2021    CARDIAC CATHETERIZATION  Result Date: 06/04/2022 Conclusions: Mild-moderate, non-obstructive coronary artery disease, similar to prior catheterization in 08/2020. Widely patent ostial LAD stent. Patent mid LAD stent with minimal in  stent restenosis (~10% narrowing). Upper normal left and right heart filling pressures. Borderline elevated pulmonary artery pressure. Normal to mildly reduced cardiac output/index. Recommendations: No obvious findings to explain the patient's progressive symptoms and NSVT on recent event monitor. Restart metoprolol succinate 25 mg daily. Aggressive secondary prevention of coronary artery disease and medical therapy for chronic HFpEF. [name], MD [facility]   Assessment & Plan:  .Essential hypertension -     COMPLETE METABOLIC PANEL WITH GFR  Type 2 diabetes mellitus with diabetic neuropathy, with long-term current use of insulin (HCC) -     Hemoglobin A1c  Hyperlipidemia associated with type 2 diabetes mellitus (H[facility]) -     LDL cholesterol, direct -     Lipid panel  Respiratory illness -     Benzonatate; Take 1 capsule (200 mg total) by mouth 2 (two) times daily as needed for cough.  Dispense: 20 capsule; Refill: 0  DM type 2 with diabetic peripheral neuropathy [facility]) Assessment & Plan: Downloaded and reviewed CBG monitor,  she is in range 75% of the time.  Lowest sugars occurring overnight.  Frequent spikes each day due to sipping on sweet tea.  Cautioned not to react to alarms after eating but to wait for the 2 hr post prandial readings.  Advised to start limiting her sweet tea and EXERCISING daily to improve her insulin resistance  Orders: -     Tresiba FlexTouch; Inject 20 Units into the skin daily.  Dispense: 15 mL; Refill: 3  CKD stage 3 due to type 2 diabetes mellitus ([place]) Assessment & Plan: GFR is stable .  Continue Farxiga,  Avoid NSAIDs.   Lab Results  Component Value Date   CREATININE 1.07 (H) 05/31/2022     Diabetic polyneuropathy associated with type 2 diabetes mellitus ([place]) Assessment & Plan: She has minimal sensation on microfilament test,  And requires continued  use of diabetic shoes to prevent pressure  Ulcers.    DOE (dyspnea on  exertion) Assessment & Plan: Lung function pretty good, admitting occasional mild cough or wheeze. Bigger factors are obesity, deconditioning and cardiac problemsl Plan- continue Rescue inhaler and Anoro. Walk more and eat less.   Frequent falls Assessment & Plan: Secondary to daytime hypersomnolence due to OHS ,  Abnormal gait secondary to neuropathy , and physical deconditioning resulting in lack of core strength.  Her recurrent falls  are impossible to prevent until patient becomes more physically fit.Prognosis is poor given her lack of initiative and insight    Need for immunization against influenza -     Flu Vaccine QUAD High Dose(Fluad)  Other orders -     FreeStyle Libre 2 Sensor; 1 application  by Does not apply route every 14 (fourteen) days.  Dispense: 2 each; Refill: 0 -     Ondansetron; Take 1 tablet (4 mg total) by mouth every 8 (eight) hours as needed for nausea or vomiting.  Dispense: 30 tablet; Refill: 0     In addition to the time spent reviewing data downloaded from her continuous blood glucose monitor, I provided 40 minutes of face-to-face time during this encounter reviewing patient's last visit with me, patient's  most recent visit with cardiology,  nephrology,  and neurology,  recent surgical and non surgical procedures, previous  labs and imaging studies, counseling on currently addressed issues,  and post visit ordering to diagnostics and therapeutics .   Follow-up: No follow-ups on file.   [name], MD

## 2022-07-13 NOTE — Assessment & Plan Note (Signed)
Downloaded and reviewed CBG monitor,  she is in range 75% of the time.  Lowest sugars occurring overnight.  Frequent spikes each day due to sipping on sweet tea.  Cautioned not to react to alarms after eating but to wait for the 2 hr post prandial readings.  Advised to start limiting her sweet tea and EXERCISING daily to improve her insulin resistance

## 2022-07-13 NOTE — Assessment & Plan Note (Signed)
Secondary to daytime hypersomnolence due to OHS ,  Abnormal gait secondary to neuropathy , and physical deconditioning resulting in lack of core strength.  Her recurrent falls  are impossible to prevent until patient becomes more physically fit.Prognosis is poor given her lack of initiative and insight

## 2022-07-13 NOTE — Patient Instructions (Addendum)
Take 15 units of Tresiba at night   Your blood sugars are spiking because of your sweet tea . Please switch to sugar free   Stop the sertraline (zoloft)   You need to start being more active.  You are not burning any calories in bed .  I recommend you figure out how to get your feet to stay on the recumbent bike because it is the safest form of exercise for you

## 2022-07-13 NOTE — Assessment & Plan Note (Signed)
She has minimal sensation on microfilament test,  And requires continued  use of diabetic shoes to prevent pressure  Ulcers.

## 2022-07-15 LAB — COMPLETE METABOLIC PANEL WITH GFR
AG Ratio: 1.2 (calc) (ref 1.0–2.5)
ALT: 7 U/L (ref 6–29)
AST: 11 U/L (ref 10–35)
Albumin: 3.7 g/dL (ref 3.6–5.1)
Alkaline phosphatase (APISO): 144 U/L (ref 37–153)
BUN/Creatinine Ratio: 12 (calc) (ref 6–22)
BUN: 14 mg/dL (ref 7–25)
CO2: 20 mmol/L (ref 20–32)
Calcium: 8.9 mg/dL (ref 8.6–10.4)
Chloride: 106 mmol/L (ref 98–110)
Creat: 1.14 mg/dL — ABNORMAL HIGH (ref 0.60–1.00)
Globulin: 3 g/dL (calc) (ref 1.9–3.7)
Glucose, Bld: 117 mg/dL — ABNORMAL HIGH (ref 65–99)
Potassium: 4.4 mmol/L (ref 3.5–5.3)
Sodium: 140 mmol/L (ref 135–146)
Total Bilirubin: 0.3 mg/dL (ref 0.2–1.2)
Total Protein: 6.7 g/dL (ref 6.1–8.1)
eGFR: 50 mL/min/{1.73_m2} — ABNORMAL LOW (ref >=60)

## 2022-07-15 LAB — LIPID PANEL
Cholesterol: 201 mg/dL — ABNORMAL HIGH (ref <200)
HDL: 50 mg/dL (ref >=50)
LDL Cholesterol (Calc): 114 mg/dL (calc) — ABNORMAL HIGH
Non-HDL Cholesterol (Calc): 151 mg/dL (calc) — ABNORMAL HIGH (ref <130)
Total CHOL/HDL Ratio: 4 (calc) (ref <5.0)
Triglycerides: 246 mg/dL — ABNORMAL HIGH (ref <150)

## 2022-07-15 LAB — HEMOGLOBIN A1C
Hgb A1c MFr Bld: 7.1 % of total Hgb — ABNORMAL HIGH (ref <5.7)
Mean Plasma Glucose: 157 mg/dL
eAG (mmol/L): 8.7 mmol/L

## 2022-07-15 LAB — LDL CHOLESTEROL, DIRECT: Direct LDL: 126 mg/dL — ABNORMAL HIGH (ref <100)

## 2022-07-16 ENCOUNTER — Other Ambulatory Visit: Payer: Self-pay | Admitting: Internal Medicine

## 2022-07-18 ENCOUNTER — Telehealth: Payer: Self-pay | Admitting: Internal Medicine

## 2022-07-18 NOTE — Telephone Encounter (Signed)
After identifying patient. I have called and explained to patient why she cannot receive the Vaccine in office .   That she can however receive at her local pharmacy or at the [place]. Patient still continued to ask which pharmacy advised her this was her responsibility to find the covered pharmacy but from the information I could gather from her insurance any local pharmacy is covered. I also offered for patient to pay up front and she could ask her insurance to re inverse her for the cost was the only option I could give for her to have the vaccine administered here after giving the cost patient stated she would call her insurance to see where she can receive the Vaccine.

## 2022-07-21 ENCOUNTER — Other Ambulatory Visit: Payer: Self-pay | Admitting: Internal Medicine

## 2022-07-23 NOTE — Telephone Encounter (Signed)
Refilled: 05/14/2022 Last OV: 07/13/2022 Next OV: not scheduled

## 2022-07-25 ENCOUNTER — Ambulatory Visit: Payer: Medicare Other | Admitting: *Deleted

## 2022-07-25 ENCOUNTER — Inpatient Hospital Stay: Payer: Medicare Other

## 2022-07-25 ENCOUNTER — Encounter: Payer: Self-pay | Admitting: Internal Medicine

## 2022-07-25 ENCOUNTER — Inpatient Hospital Stay: Payer: Medicare Other | Attending: Internal Medicine

## 2022-07-25 ENCOUNTER — Inpatient Hospital Stay (HOSPITAL_BASED_OUTPATIENT_CLINIC_OR_DEPARTMENT_OTHER): Payer: Medicare Other | Admitting: Internal Medicine

## 2022-07-25 ENCOUNTER — Ambulatory Visit
Admission: RE | Admit: 2022-07-25 | Discharge: 2022-07-25 | Disposition: A | Discharge status: Home / Self Care | Payer: Medicare Other | Source: Ambulatory Visit | Attending: Internal Medicine | Admitting: Internal Medicine

## 2022-07-25 ENCOUNTER — Other Ambulatory Visit: Payer: Self-pay

## 2022-07-25 VITALS — BP 132/73 | HR 72 | Temp 97.6°F | Resp 18

## 2022-07-25 VITALS — BP 139/72 | HR 71 | Temp 97.8°F | Resp 18 | Wt 250.0 lb

## 2022-07-25 DIAGNOSIS — R188 Other ascites: Secondary | ICD-10-CM | POA: Diagnosis not present

## 2022-07-25 DIAGNOSIS — E1149 Type 2 diabetes mellitus with other diabetic neurological complication: Secondary | ICD-10-CM

## 2022-07-25 DIAGNOSIS — E1121 Type 2 diabetes mellitus with diabetic nephropathy: Secondary | ICD-10-CM

## 2022-07-25 DIAGNOSIS — E538 Deficiency of other specified B group vitamins: Secondary | ICD-10-CM | POA: Diagnosis not present

## 2022-07-25 DIAGNOSIS — N183 Chronic kidney disease, stage 3 unspecified: Secondary | ICD-10-CM | POA: Diagnosis not present

## 2022-07-25 DIAGNOSIS — D508 Other iron deficiency anemias: Secondary | ICD-10-CM | POA: Diagnosis not present

## 2022-07-25 DIAGNOSIS — R140 Abdominal distension (gaseous): Secondary | ICD-10-CM | POA: Insufficient documentation

## 2022-07-25 DIAGNOSIS — K912 Postsurgical malabsorption, not elsewhere classified: Secondary | ICD-10-CM | POA: Diagnosis not present

## 2022-07-25 DIAGNOSIS — E1122 Type 2 diabetes mellitus with diabetic chronic kidney disease: Secondary | ICD-10-CM | POA: Insufficient documentation

## 2022-07-25 DIAGNOSIS — Z8042 Family history of malignant neoplasm of prostate: Secondary | ICD-10-CM | POA: Insufficient documentation

## 2022-07-25 DIAGNOSIS — E876 Hypokalemia: Secondary | ICD-10-CM | POA: Insufficient documentation

## 2022-07-25 DIAGNOSIS — I5030 Unspecified diastolic (congestive) heart failure: Secondary | ICD-10-CM | POA: Insufficient documentation

## 2022-07-25 DIAGNOSIS — E611 Iron deficiency: Secondary | ICD-10-CM

## 2022-07-25 DIAGNOSIS — E559 Vitamin D deficiency, unspecified: Secondary | ICD-10-CM

## 2022-07-25 DIAGNOSIS — M2041 Other hammer toe(s) (acquired), right foot: Secondary | ICD-10-CM

## 2022-07-25 DIAGNOSIS — D509 Iron deficiency anemia, unspecified: Secondary | ICD-10-CM

## 2022-07-25 DIAGNOSIS — I700 Atherosclerosis of aorta: Secondary | ICD-10-CM | POA: Insufficient documentation

## 2022-07-25 DIAGNOSIS — E08621 Diabetes mellitus due to underlying condition with foot ulcer: Secondary | ICD-10-CM

## 2022-07-25 DIAGNOSIS — I130 Hypertensive heart and chronic kidney disease with heart failure and stage 1 through stage 4 chronic kidney disease, or unspecified chronic kidney disease: Secondary | ICD-10-CM | POA: Diagnosis not present

## 2022-07-25 DIAGNOSIS — R635 Abnormal weight gain: Secondary | ICD-10-CM | POA: Insufficient documentation

## 2022-07-25 DIAGNOSIS — E1142 Type 2 diabetes mellitus with diabetic polyneuropathy: Secondary | ICD-10-CM | POA: Diagnosis not present

## 2022-07-25 LAB — COMPREHENSIVE METABOLIC PANEL
ALT: 15 U/L (ref 0–44)
AST: 22 U/L (ref 15–41)
Albumin: 3.4 g/dL — ABNORMAL LOW (ref 3.5–5.0)
Alkaline Phosphatase: 145 U/L — ABNORMAL HIGH (ref 38–126)
Anion gap: 9 (ref 5–15)
BUN: 15 mg/dL (ref 8–23)
CO2: 24 mmol/L (ref 22–32)
Calcium: 8.5 mg/dL — ABNORMAL LOW (ref 8.9–10.3)
Chloride: 106 mmol/L (ref 98–111)
Creatinine, Ser: 1.09 mg/dL — ABNORMAL HIGH (ref 0.44–1.00)
GFR, Estimated: 53 mL/min — ABNORMAL LOW (ref >60)
Glucose, Bld: 164 mg/dL — ABNORMAL HIGH (ref 70–99)
Potassium: 4.3 mmol/L (ref 3.5–5.1)
Sodium: 139 mmol/L (ref 135–145)
Total Bilirubin: 0.2 mg/dL — ABNORMAL LOW (ref 0.3–1.2)
Total Protein: 7.1 g/dL (ref 6.5–8.1)

## 2022-07-25 LAB — CBC WITH DIFFERENTIAL/PLATELET
Abs Immature Granulocytes: 0.09 K/uL — ABNORMAL HIGH (ref 0.00–0.07)
Basophils Absolute: 0.1 K/uL (ref 0.0–0.1)
Basophils Relative: 1 %
Eosinophils Absolute: 0.2 K/uL (ref 0.0–0.5)
Eosinophils Relative: 2 %
HCT: 41.5 % (ref 36.0–46.0)
Hemoglobin: 13.3 g/dL (ref 12.0–15.0)
Immature Granulocytes: 1 %
Lymphocytes Relative: 32 %
Lymphs Abs: 3.3 K/uL (ref 0.7–4.0)
MCH: 29.8 pg (ref 26.0–34.0)
MCHC: 32 g/dL (ref 30.0–36.0)
MCV: 93 fL (ref 80.0–100.0)
Monocytes Absolute: 0.6 K/uL (ref 0.1–1.0)
Monocytes Relative: 5 %
Neutro Abs: 6.2 K/uL (ref 1.7–7.7)
Neutrophils Relative %: 59 %
Platelets: 298 K/uL (ref 150–400)
RBC: 4.46 MIL/uL (ref 3.87–5.11)
RDW: 13.7 % (ref 11.5–15.5)
WBC: 10.3 K/uL (ref 4.0–10.5)
nRBC: 0 % (ref 0.0–0.2)

## 2022-07-25 LAB — PROTIME-INR
INR: 1.1 (ref 0.8–1.2)
Prothrombin Time: 14 seconds (ref 11.4–15.2)

## 2022-07-25 LAB — VITAMIN B12: Vitamin B-12: 273 pg/mL (ref 180–914)

## 2022-07-25 LAB — APTT: aPTT: 30 seconds (ref 24–36)

## 2022-07-25 MED ORDER — SODIUM CHLORIDE 0.9 % IV SOLN
Freq: Once | INTRAVENOUS | Status: AC
  Filled 2022-07-25: qty 250

## 2022-07-25 MED ORDER — IOHEXOL 300 MG/ML  SOLN
100.0000 mL | Freq: Once | INTRAMUSCULAR | Status: AC | PRN
  Administered 2022-07-25: 100 mL via INTRAVENOUS

## 2022-07-25 MED ORDER — SODIUM CHLORIDE 0.9 % IV SOLN
510.0000 mg | Freq: Once | INTRAVENOUS | Status: AC
  Administered 2022-07-25: 510 mg via INTRAVENOUS
  Filled 2022-07-25: qty 510

## 2022-07-25 NOTE — Progress Notes (Addendum)
Patient presents to the office today for diabetic shoe and insole measuring.  Patient was measured with brannock device to determine size and width for 1 pair of extra depth shoes and foam casted for 3 pair of insoles.   Documentation of medical necessity will be sent to patient's treating diabetic doctor to verify and sign.   ~Patient states she did bring by her signed documentation from [name] at last visit here, but couldn't find record of it, so went ahead and re-submitted it.  Patient's diabetic provider: Dr. Deborra Medina  Shoes and insoles will be ordered at that time and patient will be notified for an appointment for fitting when they arrive.   Shoe size (per patient): Patient says unsure of current size, but thought 9   Brannock measurement: RIGHT: 7.5 E, LEFT: 7 E  Patient shoe selection-   Shoe choice:   Apex [id number]  Shoe size ordered: Women's 8 X-Wide  *Ordered larger size due to foot deformity

## 2022-07-25 NOTE — Progress Notes (Signed)
Patient is concerned with continued wt gain despite not eating much, has been speaking with PCP.  No energy, feels only has 3 hours a day where she can do anything.

## 2022-07-25 NOTE — Progress Notes (Signed)
[place] NOTE  Patient Care Team: [name], MD as PCP - General (Internal Medicine) End, Harrell Gave, MD as PCP - Cardiology (Cardiology) Chiropractic, Freddi Che as Referring Physician Cammie Sickle, MD as Consulting Physician (Hematology)  CHIEF COMPLAINTS/PURPOSE OF CONSULTATION: Anemia   HEMATOLOGY HISTORY  # IRON DEFICIENT ANEMIA [place] TO [place]; 2018 IV iron infusion [Dr. Finnegan] EGD/colonoscopy- ?2021 [Dr.Wohl]; NOV 2021-hemoglobin 10.  Iron saturation 5% ferritin 12. EGD- NOV 2022- Dr.Wohl.  # GASTRIC BYPASS x2 [2005]; CAD/CHF [s/p stent; Dr.End]; CKD stage III [Dr.Korrapati]; poorly controlled diabetes- [Dr.Tullo]  HISTORY OF PRESENTING ILLNESS: Alone.  Walking with a rolling walker.  Denise Sharp 77 y.o.  female with multiple medical comorbidities including CHF diuretics hypokalemia iron deficiency secondary gastric bypass is here for follow-up.  Patient is concerned with continued wt gain despite not eating much.  Also complains of weight gain.  Complains of abdominal bloating.  Denies any worsening swelling in the legs.  Patient is compliant with her diuretics. No energy, feels only has 3 hours a day where she can do anything.   complains of fatigue.  Complains of cramping.  Nausea no vomiting.    Review of Systems  Constitutional:  Positive for malaise/fatigue. Negative for chills, diaphoresis, fever and weight loss.  HENT:  Negative for nosebleeds and sore throat.   Eyes:  Negative for double vision.  Respiratory:  Negative for cough, hemoptysis, sputum production, shortness of breath and wheezing.   Cardiovascular:  Negative for chest pain, palpitations, orthopnea and leg swelling.  Gastrointestinal:  Positive for nausea. Negative for abdominal pain, blood in stool, constipation, heartburn, melena and vomiting.  Genitourinary:  Negative for dysuria, frequency and urgency.  Musculoskeletal:  Positive for back pain,  joint pain and myalgias.  Skin: Negative.  Negative for itching and rash.  Neurological:  Negative for dizziness, tingling, focal weakness, weakness and headaches.  Endo/Heme/Allergies:  Does not bruise/bleed easily.  Psychiatric/Behavioral:  Negative for depression. The patient is not nervous/anxious and does not have insomnia.     MEDICAL HISTORY:  Past Medical History:  Diagnosis Date   (HFpEF) heart failure with preserved ejection fraction ([place])    a. 12/2019 Echo: EF 65-70%, Gr2 DD. No significant valvular dzs.   Abnormality of gait 03/25/2013   Adrenal mass, left (HCC)    Anginal pain (HCC)    Anxiety    Aortic atherosclerosis (HCC)    Arthritis    Asthma    CAD (coronary artery disease) with h/o Atypical Chest Pain    a. 04/1986 Cath ([facility]): nl cors, EF 65%; b. 07/2012 Cath [facility]): Diff minor irregs; c. 07/2016 MV [facility]): "Equivocal"; d. 08/2016 Cardiac CT Ca2+ score [facility]): Ca2+ 6440; e. 05/2019 MV: No ischemia. EF 75%; f. 12/2019 PCI: LM nl, LAD 65ost (3.5x12 Resolute DES), 66m(2.75x12 Resolute DES),LCX/RCA nl; g. 08/2020 Cath: patent LAD stents, RCA 40ost, elev RH pressures, EF >65%.   Cervical spinal stenosis 1994   due to trauma to back ([employer] accident), has intermittent paralysis and parasthesias   Cervicogenic headache 03/23/2014   CKD (chronic kidney disease), stage III (HCC)    Depression    Diverticulosis    Dizziness    a.) chronic   DJD (degenerative joint disease)    a. Chronic R shoulder pain   Dyspnea    Esophageal stenosis 09/2009   a.) transient outlet obstruction by food, cleared by EGD   Family history of adverse reaction to anesthesia    a.) daughter with (+)  PONV   Gastric bypass status for obesity    GERD (gastroesophageal reflux disease)    Headache(784.0)    HLD (hyperlipidemia)    Hypertension    IBS (irritable bowel syndrome)    IDA (iron deficiency anemia)    a.) post 2 unit txfsn 2009, normal endo/colonoscopy by [facility]   Left bundle branch  block (LBBB)    a.) Intermittently present - likely rate related.   NSVT (nonsustained ventricular tachycardia) ([place]) 11/15/2020   a.) Holter study 11/15/2020; 11 episodes of NSVT lasting up to 8 beats with a maximum rate of 210 bpm; 14 atrial runs lasting up to 18 beats with a rate of up to 250 bpm.  Some atrial runs felt to be NSVT.   NSVT (nonsustained ventricular tachycardia) ([place])    a. 10/2020 Zio: 11 runs of NSVT up to 8 beats.   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 10/2020 [facility]io: 14 atrial runs up to 18 beats, max HR 250.   Restless leg syndrome    Rotator cuff arthropathy, right 08/13/2017   Syncope and collapse 03/12/2014   Type II diabetes mellitus ([place])     SURGICAL HISTORY: Past Surgical History:  Procedure Laterality Date   APPENDECTOMY     BACK SURGERY     CHOLECYSTECTOMY N/A 1976   COLONOSCOPY     CORONARY ANGIOPLASTY     CORONARY STENT INTERVENTION N/A 01/04/2020   Procedure: CORONARY STENT INTERVENTION;  Surgeon: [name], MD;  Location: [place] [facility];  Service: Cardiovascular;  Laterality: N/A;  LAD    DIAPHRAGMATIC HERNIA REPAIR  2015   ESOPHAGEAL DILATION     multiple   ESOPHAGOGASTRODUODENOSCOPY (EGD) WITH PROPOFOL N/A 05/11/2021   Procedure: ESOPHAGOGASTRODUODENOSCOPY (EGD) WITH PROPOFOL;  Surgeon: [name], MD;  Location: [facility];  Service: Endoscopy;  Laterality: N/A;   ESOPHAGOGASTRODUODENOSCOPY (EGD) WITH PROPOFOL N/A 06/13/2021   Procedure: ESOPHAGOGASTRODUODENOSCOPY (EGD) WITH PROPOFOL;  Surgeon: [name], MD;  Location: [facility];  Service: Endoscopy;  Laterality: N/A;   EYE SURGERY     GASTRIC BYPASS  2000, 2005   Dr. [name] IMPLANT PLACEMENT  10/2011   Hollice Gong IMPLANT PLACEMENT N/A 09/18/2021   Procedure: Barrie Lyme IMPLANT FIRST STAGE;   Surgeon: Bjorn Loser, MD;  Location: [facility];  Service: Urology;  Laterality: N/A;   INTERSTIM IMPLANT PLACEMENT N/A 09/18/2021   Procedure: Barrie Lyme IMPLANT SECOND STAGE WITH IMPEDENCE CHECK;  Surgeon: Bjorn Loser, MD;  Location: [facility];  Service: Urology;  Laterality: N/A;   INTERSTIM IMPLANT REMOVAL N/A 09/18/2021   Procedure: REMOVAL OF INTERSTIM IMPLANT;  Surgeon: Bjorn Loser, MD;  Location: [facility];  Service: Urology;  Laterality: N/A;   LEFT HEART CATH AND CORONARY ANGIOGRAPHY N/A 04/1986   LEFT HEART CATH AND CORONARY ANGIOGRAPHY Left 07/24/2012   LEFT HEART CATH AND CORS/GRAFTS ANGIOGRAPHY N/A 12/31/2019   Procedure: LEFT HEART CATH AND CORS/GRAFTS ANGIOGRAPHY;  Surgeon: [name], MD;  Location: [place] [facility];  Service: Cardiovascular;  Laterality: N/A;   PANNICULECTOMY N/A 06/16/2019   Procedure: PANNICULECTOMY;  Surgeon: [name], MD;  Location: [place];  Service: Plastics;  Laterality: N/A;  3 hours, please   REVERSE SHOULDER ARTHROPLASTY Right 08/13/2017   Procedure: REVERSE RIGHT SHOULDER ARTHROPLASTY;  Surgeon: [name], MD;  Location: [place];  Service: Orthopedics;  Laterality: Right;   RIGHT/LEFT HEART CATH AND CORONARY ANGIOGRAPHY N/A 09/06/2020   Procedure: RIGHT/LEFT HEART CATH AND CORONARY ANGIOGRAPHY;  Surgeon: [name], MD;  Location: [place] [facility];  Service: Cardiovascular;  Laterality: N/A;   RIGHT/LEFT HEART CATH AND CORONARY ANGIOGRAPHY N/A 06/04/2022   Procedure: RIGHT/LEFT HEART CATH AND CORONARY ANGIOGRAPHY;  Surgeon: [name], MD;  Location: [place] [facility];  Service: Cardiovascular;  Laterality: N/A;   ROTATOR CUFF REPAIR Right    SPINE SURGERY  1995   [place]   TOOTH EXTRACTION  11/13/2021   TOTAL ABDOMINAL HYSTERECTOMY W/ BILATERAL SALPINGOOPHORECTOMY  1974   TOTAL KNEE ARTHROPLASTY Bilateral 2007   Procedure: TOTAL KNEE ARTHROPLASTY; Surgeons: Mauri Pole, MD and [name], MD   UMBILICAL  HERNIA REPAIR  08/11/2015    SOCIAL HISTORY: Social History   Socioeconomic History   Marital status: Married    Spouse name: [name]    Number of children: 2   Years of education: College    Highest education level: Not on file  Occupational History    Employer: retired  Tobacco Use   Smoking status: Never    Passive exposure: Never   Smokeless tobacco: Never  Vaping Use   Vaping Use: Never used  Substance and Sexual Activity   Alcohol use: No   Drug use: Not Currently    Comment: prescribed valium   Sexual activity: Not Currently    Birth control/protection: Post-menopausal, Surgical  Other Topics Concern   Not on file  Social History Narrative   Patient lives at home with husband [name].    Right Handed   Drinks caffeinated tea occasionally   Social Determinants of Health   Financial Resource Strain: Low Risk  (09/15/2021)   Overall Financial Resource Strain (CARDIA)    Difficulty of Paying Living Expenses: Not hard at all  Food Insecurity: No Food Insecurity (10/19/2020)   Hunger Vital Sign    Worried About Running Out of Food in the Last Year: Never true    Ran Out of Food in the Last Year: Never true  Transportation Needs: No Transportation Needs (10/19/2020)   PRAPARE - Hydrologist (Medical): No    Lack of Transportation (Non-Medical): No  Physical Activity: Insufficiently Active (11/15/2020)   Exercise Vital Sign    Days of Exercise per Week: 2 days    Minutes of Exercise per Session: 40 min  Stress: Stress Concern Present (08/03/2020)   [place]    Feeling of Stress : Rather much  Social Connections: Not on file  Intimate Partner Violence: Not At Risk (10/19/2020)   Humiliation, Afraid, Rape, and Kick questionnaire    Fear of Current or Ex-Partner: No    Emotionally Abused: No    Physically Abused: No    Sexually Abused: No    FAMILY HISTORY: Family History   Problem Relation Age of Onset   Heart disease Father    Hypertension Father    Prostate cancer Father    Stroke Father    Osteoporosis Father    Stroke Mother    Depression Mother    Headache Mother    Heart disease Mother    Thyroid disease Mother    Hypertension Mother    Diabetes Daughter    Heart disease Daughter    Hypertension Daughter    Hypertension Son     ALLERGIES:  is allergic to demeclocycline, erythromycin, flagyl [metronidazole], glucophage [metformin  hcl], tetracyclines & related, diovan [valsartan], and xanax [alprazolam].  MEDICATIONS:  Current Outpatient Medications  Medication Sig Dispense Refill   aspirin EC 81 MG tablet Take 81 mg by mouth daily. Swallow whole.     benzonatate (TESSALON) 200 MG capsule Take 1 capsule (200 mg total) by mouth 2 (two) times daily as needed for cough. 20 capsule 0   butalbital-acetaminophen-caffeine (FIORICET) 50-325-40 MG tablet TAKE 1 TABLET BY MOUTH EVERY 6 HOURS AS NEEDED FOR HEADACHE. 60 tablet 2   Cholecalciferol (VITAMIN D3) 250 MCG (10000 UT) capsule Take 10,000 Units by mouth daily.     clotrimazole-betamethasone (LOTRISONE) cream Apply 1 application topically 2 (two) times daily as needed (irritation).     conjugated estrogens (PREMARIN) vaginal cream Apply one pea-sized amount around the opening of the urethra daily for 2 weeks, then 3 times weekly moving forward. (Patient taking differently: Apply one pea-sized amount around the opening of the urethra  3 times weekly) 30 g 4   Continuous Blood Gluc Receiver (FREESTYLE LIBRE 2 READER) DEVI Use to check sugar at least TID 1 each 1   Continuous Blood Gluc Sensor (FREESTYLE LIBRE 2 SENSOR) MISC 1 application  by Does not apply route every 14 (fourteen) days. 2 each 0   cyanocobalamin (,VITAMIN B-12,) 1000 MCG/ML injection Inject 1 mL (1,000 mcg total) into the muscle every 30 (thirty) days. Inject 1 ml (1000 mcg ) IM weekly x 4,  Then monthly thereafter 3 mL 1    dapagliflozin propanediol (FARXIGA) 10 MG TABS tablet Take 1 tablet (10 mg total) by mouth daily. 90 tablet 3   dicyclomine (BENTYL) 20 MG tablet Take 1 tablet (20 mg total) by mouth 3 (three) times daily before meals. 90 tablet 0   fexofenadine (ALLEGRA) 180 MG tablet Take 180 mg by mouth as needed for allergies or rhinitis.     fluticasone (FLONASE) 50 MCG/ACT nasal spray Place 2 sprays into both nostrils daily. (Patient taking differently: Place 2 sprays into both nostrils 3 (three) times daily as needed for allergies.) 16 g 6   glucose blood (ONETOUCH VERIO) test strip Use to check blood sugar twice daily. 100 each 1   Homeopathic Products (LEG CRAMPS PO) Take 1 tablet by mouth at bedtime as needed (neuropathy pain).     insulin aspart (NOVOLOG FLEXPEN) 100 UNIT/ML FlexPen Inject 4-16 units with meals per sliding scale: If pre-meal glucose <100, inject 4 units; 100-150 - inject 6 units, 151-200 - inject 8 units; 201-250 - inject 10 units; 251-300 - inject 12 units, 301-350 - inject 14 units ; 350 > inject 16 units (Patient taking differently: Inject 4-14 Units into the skin 3 (three) times daily.) 15 mL 3   insulin degludec (TRESIBA FLEXTOUCH) 100 UNIT/ML FlexTouch Pen Inject 20 Units into the skin daily. 15 mL 3   Insulin Pen Needle (DROPLET PEN NEEDLES) 31G X 5 MM MISC USE ONE NEEDLE SUBCUTANEOUSLY AS DIRECTED (REMOVE AND DISCARD NEEDLE IN SHARPS CONTAINER IMMEDIATELY AFTER USE) 100 each 1   loperamide (IMODIUM) 2 MG capsule Take 4-6 mg by mouth 3 (three) times daily before meals.     magnesium gluconate (MAGONATE) 500 MG tablet Take 500 mg by mouth daily.     metoprolol succinate (TOPROL XL) 25 MG 24 hr tablet Take 1 tablet (25 mg total) by mouth daily. 90 tablet 3   nitroGLYCERIN (NITROSTAT) 0.4 MG SL tablet Place 1 tablet (0.4 mg total) under the tongue every 5 (five) minutes as needed for chest pain.  25 tablet 1   nystatin (MYCOSTATIN/NYSTOP) powder Apply 1 g topically 4 (four) times daily as  needed (rash).     ondansetron (ZOFRAN-ODT) 4 MG disintegrating tablet Take 1 tablet (4 mg total) by mouth every 8 (eight) hours as needed for nausea or vomiting. 30 tablet 0   OneTouch Delica Lancets 01X MISC Used to check blood sugar two times a day. 100 each 4   OVER THE COUNTER MEDICATION Apply 1 application topically daily as needed (pain). [place] Gel topical pain reliever     pantoprazole (PROTONIX) 40 MG tablet Take 1 tablet (40 mg total) by mouth daily. MUST SCHEDULE OFFICE VIST 30 tablet 1   Polyethyl Glycol-Propyl Glycol (SYSTANE OP) Place 1 drop into both eyes 4 (four) times daily as needed (dry eyes).     pregabalin (LYRICA) 50 MG capsule TAKE 1 CAPSULE BY MOUTH TWICE A DAY 60 capsule 2   sertraline (ZOLOFT) 25 MG tablet TAKE 1 TABLET (25 MG TOTAL) BY MOUTH DAILY. 90 tablet 1   albuterol (VENTOLIN HFA) 108 (90 Base) MCG/ACT inhaler Inhale 1-2 puffs into the lungs every 6 (six) hours as needed for wheezing or shortness of breath. (Patient not taking: Reported on 07/13/2022) 18 g 2   atorvastatin (LIPITOR) 40 MG tablet Take 2 tablets (80 mg total) by mouth daily. (Patient not taking: Reported on 07/25/2022) 180 tablet 0   diphenoxylate-atropine (LOMOTIL) 2.5-0.025 MG tablet Take 1 tablet by mouth 4 (four) times daily as needed for diarrhea or loose stools. (Patient not taking: Reported on 07/25/2022) 60 tablet 5   No current facility-administered medications for this visit.      PHYSICAL EXAMINATION:   Vitals:   07/25/22 1300  BP: 139/72  Pulse: 71  Resp: 18  Temp: 97.8 F (36.6 C)  SpO2: 97%    Filed Weights   07/25/22 1300  Weight: 250 lb (113.4 kg)     Physical Exam Constitutional:      Comments: Obese.  HENT:     Head: Normocephalic and atraumatic.     Mouth/Throat:     Pharynx: No oropharyngeal exudate.  Eyes:     Pupils: Pupils are equal, round, and reactive to light.  Cardiovascular:     Rate and Rhythm: Normal rate and regular rhythm.     Pulses: Normal  pulses.     Heart sounds: Normal heart sounds.  Pulmonary:     Effort: Pulmonary effort is normal. No respiratory distress.     Breath sounds: Normal breath sounds. No wheezing.  Abdominal:     General: Bowel sounds are normal. There is no distension.     Palpations: Abdomen is soft. There is no mass.     Tenderness: There is no abdominal tenderness. There is no guarding or rebound.  Musculoskeletal:        General: No tenderness. Normal range of motion.     Cervical back: Normal range of motion and neck supple.  Skin:    General: Skin is warm.  Neurological:     Mental Status: She is alert and oriented to person, place, and time.  Psychiatric:        Mood and Affect: Affect normal.     LABORATORY DATA:  I have reviewed the data as listed Lab Results  Component Value Date   WBC 10.3 07/25/2022   HGB 13.3 07/25/2022   HCT 41.5 07/25/2022   MCV 93.0 07/25/2022   PLT 298 07/25/2022   Recent Labs    11/16/21 1203 01/05/22  1446 02/09/22 1119 03/22/22 1626 04/03/22 1213 05/31/22 1142 06/04/22 1112 06/04/22 1115 07/13/22 1529 07/25/22 1311  NA 140   < > 143   < > 137 142   < > 141 140 139  K 4.1   < > 3.7   < > 4.4 4.7   < > 4.4 4.4 4.3  CL 105   < > 107   < > 104 110  --   --  106 106  CO2 28   < > 24   < > 22 22  --   --  20 24  GLUCOSE 112*   < > 129*   < > 331* 127*  --   --  117* 164*  BUN 7*   < > 11   < > 21 14  --   --  14 15  CREATININE 0.92   < > 1.06   < > 1.56* 1.07*  --   --  1.14* 1.09*  CALCIUM 8.4*   < > 8.7   < > 9.0 9.1  --   --  8.9 8.5*  GFRNONAA >60   < >  --    < > 34* 54*  --   --   --  53*  PROT 6.6  --  6.3  --   --   --   --   --  6.7 7.1  ALBUMIN 2.9*  --  3.5  --   --   --   --   --   --  3.4*  AST 16  --  13  --   --   --   --   --  11 22  ALT 11  --  11  --   --   --   --   --  7 15  ALKPHOS 150*  --  132*  --   --   --   --   --   --  145*  BILITOT 0.6  --  0.7  --   --   --   --   --  0.3 0.2*   < > = values in this interval not  displayed.     No results found.  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   # hemoglobin today is 13. Iron studies/ferritin- pending today. Proceed with Feraheme today-see below  # Weight gain/bloated/ poor appetite- ? Etiology- ? Sec to CHF vs others. Check CT scan AP ASAP.  Also recommend follow-up with GI.  # B12 deficiency secondary gastric bypass at home iron injections- stable   # Fatigue: ? Cardiac/CHF [Dr.End]- on Lasix/ Kdur; see above. stable   # Esophagus: on esophagogram ? Stricture [aug 2021]- Awaiting gappt [? EGD]s/p Dr.Wohl STABLE/better.   # RN will call with results/forward to Dr.wohl.   # DISPOSITION: # Ferrahem today # CT AP ASAP # follow up 4 month; MD: labs- cbc/cmp; B12 levels;  possible [place] Bouchard- Dr.B  All questions were answered. The patient knows to call the clinic with any problems, questions or concerns.    Cammie Sickle, MD 07/25/2022 8:35 PM

## 2022-07-25 NOTE — Assessment & Plan Note (Addendum)
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   # hemoglobin today is 13. Iron studies/ferritin- pending today. Proceed with Feraheme today-see below  # Weight gain/bloated/ poor appetite- ? Etiology- ? Sec to CHF vs others. Check CT scan AP ASAP.  Also recommend follow-up with GI.  # B12 deficiency secondary gastric bypass at home iron injections- stable   # Fatigue: ? Cardiac/CHF [Dr.End]- on Lasix/ Kdur; see above. stable   # Esophagus: on esophagogram ? Stricture [aug 2021]- Awaiting gappt [? EGD]s/p Dr.Wohl STABLE/better.   # RN will call with results/forward to Dr.wohl.   # DISPOSITION: # Ferrahem today # CT AP ASAP # follow up 4 month; MD: labs- cbc/cmp; B12 levels;  possible Ferrahem- Dr.B

## 2022-07-26 ENCOUNTER — Telehealth: Payer: Self-pay | Admitting: Gastroenterology

## 2022-07-26 ENCOUNTER — Telehealth: Payer: Self-pay | Admitting: *Deleted

## 2022-07-26 NOTE — Telephone Encounter (Signed)
Patient called stating that she went to her cancer center doctor and that she is having some abdominal bloating and she had a stat ct scan done. She states that she needs to see Dr. Allen Norris fairly quickly. Where can I schedule this patient?

## 2022-07-26 NOTE — Telephone Encounter (Signed)
Patient called asking for results of CT. Next appointment Is not until May. Any further instructions?  CLINICAL DATA:  Abdominal bloating and 30 lb weight gain in past 2 months. Nausea. Fatigue.   EXAM: CT ABDOMEN AND PELVIS WITH CONTRAST   TECHNIQUE: Multidetector CT imaging of the abdomen and pelvis was performed using the standard protocol following bolus administration of intravenous contrast.   RADIATION DOSE REDUCTION: This exam was performed according to the departmental dose-optimization program which includes automated exposure control, adjustment of the mA and/or kV according to patient size and/or use of iterative reconstruction technique.   CONTRAST:  163m OMNIPAQUE IOHEXOL 300 MG/ML  SOLN   COMPARISON:  06/27/2021   FINDINGS: Lower Chest: No acute findings.   Hepatobiliary: No hepatic masses identified. Prior cholecystectomy. No evidence of biliary obstruction.   Pancreas:  No mass or inflammatory changes.   Spleen: Within normal limits in size and appearance.   Adrenals/Urinary Tract: Stable small left adrenal nodule, consistent with benign adenoma (no followup imaging is recommended). No suspicious masses identified. No evidence of ureteral calculi or hydronephrosis. Unremarkable unopacified urinary bladder.   Stomach/Bowel: Prior gastric bypass surgery again noted. No evidence of obstruction, inflammatory process or abnormal fluid collections.   Vascular/Lymphatic: No pathologically enlarged lymph nodes. No acute vascular findings. Aortic atherosclerotic calcification incidentally noted.   Reproductive: Prior hysterectomy noted. Adnexal regions are unremarkable in appearance.   Other:  None.  No evidence of ascites.   Musculoskeletal:  No suspicious bone lesions identified.   IMPRESSION: No acute findings or other significant abnormality within the abdomen or pelvis.   Aortic Atherosclerosis (ICD10-I70.0).     Electronically Signed   By:  JMarlaine HindM.D.   On: 07/26/2022 12:04

## 2022-07-27 ENCOUNTER — Encounter: Payer: Self-pay | Admitting: Internal Medicine

## 2022-07-27 LAB — MISC LABCORP TEST (SEND OUT): Labcorp test code: 81950

## 2022-07-27 NOTE — Telephone Encounter (Signed)
Patient saw the message this morning

## 2022-07-30 ENCOUNTER — Other Ambulatory Visit: Payer: Self-pay | Admitting: Internal Medicine

## 2022-07-30 MED ORDER — ATORVASTATIN CALCIUM 80 MG PO TABS: 80.0000 mg | ORAL_TABLET | Freq: Every day | ORAL | 1 refills | Status: DC

## 2022-07-30 MED ORDER — PANTOPRAZOLE SODIUM 40 MG PO TBEC: 40.0000 mg | DELAYED_RELEASE_TABLET | Freq: Every day | ORAL | 1 refills | Status: DC

## 2022-07-30 NOTE — Telephone Encounter (Signed)
Pt need a refill on Atorvastatin and pantoprazole [facility]

## 2022-07-30 NOTE — Telephone Encounter (Signed)
Pt stated that the atorvastatin dose was changed at her last appt with you from 40 mg to 80 mg and she is now out of the rx. Pt also stated that she does not see the GI doctor until next week and will have to go without the protonix until then. If these can be sent in she [facility] day [facility].

## 2022-07-30 NOTE — Addendum Note (Signed)
Addended by: Adair Laundry on: 07/30/2022 05:17 PM   Modules accepted: Orders

## 2022-07-30 NOTE — Telephone Encounter (Signed)
Atorvastatin was last filled by Dr. Saunders Revel.   Pantoprazole was last filled by Dr. Allen Norris.   Is it okay to refuse and let pt know that she will need to call there office a have filled.

## 2022-07-31 DIAGNOSIS — H16223 Keratoconjunctivitis sicca, not specified as Sjogren's, bilateral: Secondary | ICD-10-CM | POA: Diagnosis not present

## 2022-08-03 ENCOUNTER — Telehealth: Payer: Self-pay | Admitting: Internal Medicine

## 2022-08-03 MED ORDER — NITROGLYCERIN 0.4 MG SL SUBL: 0.4000 mg | SUBLINGUAL_TABLET | SUBLINGUAL | 0 refills | Status: DC | PRN

## 2022-08-03 NOTE — Telephone Encounter (Signed)
Requested Prescriptions   Signed Prescriptions Disp Refills   nitroGLYCERIN (NITROSTAT) 0.4 MG SL tablet 25 tablet 0    Sig: Place 1 tablet (0.4 mg total) under the tongue every 5 (five) minutes as needed for chest pain.    Authorizing Provider: END, CHRISTOPHER    Ordering User: Britt Bottom

## 2022-08-03 NOTE — Telephone Encounter (Signed)
*  STAT* If patient is at the pharmacy, call can be transferred to refill team.   1. Which medications need to be refilled? (please list name of each medication and dose if known) nitroGLYCERIN (NITROSTAT) 0.4 MG SL tablet  2. Which pharmacy/location (including street and city if local pharmacy) is medication to be sent to?  [id number] [place]   3. Do they need a 30 day or 90 day supply? 974 Pt states that the RX she has now has gone out of date and she is going out of town and would like a refill called in today before she is to go out of town.

## 2022-08-06 ENCOUNTER — Encounter: Payer: Self-pay | Admitting: Gastroenterology

## 2022-08-06 ENCOUNTER — Ambulatory Visit (INDEPENDENT_AMBULATORY_CARE_PROVIDER_SITE_OTHER): Payer: Medicare Other | Admitting: Gastroenterology

## 2022-08-06 VITALS — BP 150/73 | HR 73 | Temp 98.0°F | Ht 64.5 in | Wt 244.0 lb

## 2022-08-06 DIAGNOSIS — R140 Abdominal distension (gaseous): Secondary | ICD-10-CM

## 2022-08-06 MED ORDER — DICYCLOMINE HCL 20 MG PO TABS: 20.0000 mg | ORAL_TABLET | Freq: Three times a day (TID) | ORAL | 0 refills | Status: DC

## 2022-08-06 MED ORDER — PANTOPRAZOLE SODIUM 40 MG PO TBEC: 40.0000 mg | DELAYED_RELEASE_TABLET | Freq: Every day | ORAL | 0 refills | Status: DC

## 2022-08-06 NOTE — Progress Notes (Signed)
Primary Care Physician: [name], MD  Primary Gastroenterologist:  Dr. [name]  Chief Complaint  Patient presents with   Bloated    HPI: Denise Sharp is a 77 y.o. female here with a report of abdominal bloating and weight gain.  The patient had a CT scan done by her oncologist and called shortly after that stating that she had to be seen urgently because of the CT scan.  The CT scan showed:  IMPRESSION: No acute findings or other significant abnormality within the abdomen or pelvis.   Aortic Atherosclerosis (ICD10-I70.0).  The patient has a history of abdominal pain with 2 negative upper endoscopies and a history of irritable bowel syndrome with bloating.  She had reported in the past that she barely eats anything and has not had any significant weight loss.  She was started on dicyclomine 3 times a day to see if that would help her symptoms.  It was also recommended that she talk about switching her diabetic medications with her PCP as a possible cause of her bloating. The patient reports that she is bloated shortly after eating and lasts until she usually lays down and then feels better. The patient reports that she has not been Eating much but continues to have weight gain.  She has also had gastric bypass surgery x 2.  Past Medical History:  Diagnosis Date   (HFpEF) heart failure with preserved ejection fraction ([place])    a. 12/2019 Echo: EF 65-70%, Gr2 DD. No significant valvular dzs.   Abnormality of gait 03/25/2013   Adrenal mass, left (HCC)    Anginal pain (HCC)    Anxiety    Aortic atherosclerosis (HCC)    Arthritis    Asthma    CAD (coronary artery disease) with h/o Atypical Chest Pain    a. 04/1986 Cath ([facility]): nl cors, EF 65%; b. 07/2012 Cath [facility]): Diff minor irregs; c. 07/2016 MV [facility]): "Equivocal"; d. 08/2016 Cardiac CT Ca2+ score [facility]): Ca2+ 2683; e. 05/2019 MV: No ischemia. EF 75%; f. 12/2019 PCI: LM nl, LAD 65ost (3.5x12 Resolute DES), 37m(2.75x12  Resolute DES),LCX/RCA nl; g. 08/2020 Cath: patent LAD stents, RCA 40ost, elev RH pressures, EF >65%.   Cervical spinal stenosis 1994   due to trauma to back ([employer] accident), has intermittent paralysis and parasthesias   Cervicogenic headache 03/23/2014   CKD (chronic kidney disease), stage III (HCC)    Depression    Diverticulosis    Dizziness    a.) chronic   DJD (degenerative joint disease)    a. Chronic R shoulder pain   Dyspnea    Esophageal stenosis 09/2009   a.) transient outlet obstruction by food, cleared by EGD   Family history of adverse reaction to anesthesia    a.) daughter with (+) PONV   Gastric bypass status for obesity    GERD (gastroesophageal reflux disease)    Headache(784.0)    HLD (hyperlipidemia)    Hypertension    IBS (irritable bowel syndrome)    IDA (iron deficiency anemia)    a.) post 2 unit txfsn 2009, normal endo/colonoscopy by [facility] Kral   Left bundle branch block (LBBB)    a.) Intermittently present - likely rate related.   NSVT (nonsustained ventricular tachycardia) ([place] 11/15/2020   a.) Holter study 11/15/2020; 11 episodes of NSVT lasting up to 8 beats with a maximum rate of 210 bpm; 14 atrial runs lasting up to 18 beats with a rate of up to 250 bpm.  Some  atrial runs felt to be NSVT.   NSVT (nonsustained ventricular tachycardia) ([place])    a. 10/2020 [facility]: 11 runs of NSVT up to 8 beats.   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 10/2020 [facility]: 14 atrial runs up to 18 beats, max HR 250.   Restless leg syndrome    Rotator cuff arthropathy, right 08/13/2017   Syncope and collapse 03/12/2014   Type II diabetes mellitus (HCC)     Current Outpatient Medications  Medication Sig Dispense Refill   albuterol (VENTOLIN HFA) 108 (90 Base) MCG/ACT inhaler Inhale 1-2 puffs into the lungs every 6 (six) hours as needed for  wheezing or shortness of breath. (Patient not taking: Reported on 07/13/2022) 18 g 2   aspirin EC 81 MG tablet Take 81 mg by mouth daily. Swallow whole.     atorvastatin (LIPITOR) 80 MG tablet Take 1 tablet (80 mg total) by mouth daily. 90 tablet 1   benzonatate (TESSALON) 200 MG capsule Take 1 capsule (200 mg total) by mouth 2 (two) times daily as needed for cough. 20 capsule 0   butalbital-acetaminophen-caffeine (FIORICET) 50-325-40 MG tablet TAKE 1 TABLET BY MOUTH EVERY 6 HOURS AS NEEDED FOR HEADACHE. 60 tablet 2   Cholecalciferol (VITAMIN D3) 250 MCG (10000 UT) capsule Take 10,000 Units by mouth daily.     clotrimazole-betamethasone (LOTRISONE) cream Apply 1 application topically 2 (two) times daily as needed (irritation).     conjugated estrogens (PREMARIN) vaginal cream Apply one pea-sized amount around the opening of the urethra daily for 2 weeks, then 3 times weekly moving forward. (Patient taking differently: Apply one pea-sized amount around the opening of the urethra  3 times weekly) 30 g 4   Continuous Blood Gluc Receiver (FREESTYLE LIBRE 2 READER) DEVI Use to check sugar at least TID 1 each 1   Continuous Blood Gluc Sensor (FREESTYLE LIBRE 2 SENSOR) MISC 1 application  by Does not apply route every 14 (fourteen) days. 2 each 0   cyanocobalamin (,VITAMIN B-12,) 1000 MCG/ML injection Inject 1 mL (1,000 mcg total) into the muscle every 30 (thirty) days. Inject 1 ml (1000 mcg ) IM weekly x 4,  Then monthly thereafter 3 mL 1   dapagliflozin propanediol (FARXIGA) 10 MG TABS tablet Take 1 tablet (10 mg total) by mouth daily. 90 tablet 3   dicyclomine (BENTYL) 20 MG tablet Take 1 tablet (20 mg total) by mouth 3 (three) times daily before meals. 90 tablet 0   diphenoxylate-atropine (LOMOTIL) 2.5-0.025 MG tablet Take 1 tablet by mouth 4 (four) times daily as needed for diarrhea or loose stools. (Patient not taking: Reported on 07/25/2022) 60 tablet 5   fexofenadine (ALLEGRA) 180 MG tablet Take 180 mg  by mouth as needed for allergies or rhinitis.     fluticasone (FLONASE) 50 MCG/ACT nasal spray Place 2 sprays into both nostrils daily. (Patient taking differently: Place 2 sprays into both nostrils 3 (three) times daily as needed for allergies.) 16 g 6   glucose blood (ONETOUCH VERIO) test strip Use to check blood sugar twice daily. 100 each 1   Homeopathic Products (LEG CRAMPS PO) Take 1 tablet by mouth at bedtime as needed (neuropathy pain).     insulin aspart (NOVOLOG FLEXPEN) 100 UNIT/ML FlexPen Inject 4-16 units with meals per sliding scale: If pre-meal glucose <100, inject 4 units; 100-150 -  inject 6 units, 151-200 - inject 8 units; 201-250 - inject 10 units; 251-300 - inject 12 units, 301-350 - inject 14 units ; 350 > inject 16 units (Patient taking differently: Inject 4-14 Units into the skin 3 (three) times daily.) 15 mL 3   insulin degludec (TRESIBA FLEXTOUCH) 100 UNIT/ML FlexTouch Pen Inject 20 Units into the skin daily. 15 mL 3   Insulin Pen Needle (DROPLET PEN NEEDLES) 31G X 5 MM MISC USE ONE NEEDLE SUBCUTANEOUSLY AS DIRECTED (REMOVE AND DISCARD NEEDLE IN SHARPS CONTAINER IMMEDIATELY AFTER USE) 100 each 1   loperamide (IMODIUM) 2 MG capsule Take 4-6 mg by mouth 3 (three) times daily before meals.     magnesium gluconate (MAGONATE) 500 MG tablet Take 500 mg by mouth daily.     metoprolol succinate (TOPROL XL) 25 MG 24 hr tablet Take 1 tablet (25 mg total) by mouth daily. 90 tablet 3   nitroGLYCERIN (NITROSTAT) 0.4 MG SL tablet Place 1 tablet (0.4 mg total) under the tongue every 5 (five) minutes as needed for chest pain. 25 tablet 0   nystatin (MYCOSTATIN/NYSTOP) powder Apply 1 g topically 4 (four) times daily as needed (rash).     ondansetron (ZOFRAN-ODT) 4 MG disintegrating tablet Take 1 tablet (4 mg total) by mouth every 8 (eight) hours as needed for nausea or vomiting. 30 tablet 0   OneTouch Delica Lancets 81O MISC Used to check blood sugar two times a day. 100 each 4   OVER THE  COUNTER MEDICATION Apply 1 application topically daily as needed (pain). [place] Gel topical pain reliever     pantoprazole (PROTONIX) 40 MG tablet Take 1 tablet (40 mg total) by mouth daily. 30 tablet 0   Polyethyl Glycol-Propyl Glycol (SYSTANE OP) Place 1 drop into both eyes 4 (four) times daily as needed (dry eyes).     pregabalin (LYRICA) 50 MG capsule TAKE 1 CAPSULE BY MOUTH TWICE A DAY 60 capsule 2   sertraline (ZOLOFT) 25 MG tablet TAKE 1 TABLET (25 MG TOTAL) BY MOUTH DAILY. 90 tablet 1   No current facility-administered medications for this visit.    Allergies as of 08/06/2022 - Review Complete 07/25/2022  Allergen Reaction Noted   Demeclocycline Hives 09/18/2013   Erythromycin Nausea And Vomiting and Other (See Comments) 08/17/2011   Flagyl [metronidazole] Nausea And Vomiting and Other (See Comments) 08/17/2011   Glucophage [metformin hcl] Nausea And Vomiting and Other (See Comments) 08/17/2011   Tetracyclines & related Hives and Rash 03/12/2011   Diovan [valsartan] Nausea Only 08/17/2011   Xanax [alprazolam] Other (See Comments) 08/17/2011    ROS:  General: Negative for anorexia, weight loss, fever, chills, fatigue, weakness. ENT: Negative for hoarseness, difficulty swallowing , nasal congestion. CV: Negative for chest pain, angina, palpitations, dyspnea on exertion, peripheral edema.  Respiratory: Negative for dyspnea at rest, dyspnea on exertion, cough, sputum, wheezing.  GI: See history of present illness. GU:  Negative for dysuria, hematuria, urinary incontinence, urinary frequency, nocturnal urination.  Endo: Negative for unusual weight change.    Physical Examination:   BP (!) 150/73 (BP Location: Left Arm, Patient Position: Sitting, Cuff Size: Normal)   Pulse 73   Temp 98 F (36.7 C) (Oral)   Ht 5' 4.5" (1.638 m)   Wt 244 lb (110.7 kg)   BMI 41.24 kg/m   General: Well-nourished, well-developed in no acute distress.  Eyes: No icterus. Conjunctivae  pink. Abdomen: Bowel sounds are normal, nontender, nondistended, no hepatosplenomegaly or masses, no abdominal bruits or hernia ,  no rebound or guarding.   Neuro: Alert and oriented x 3.  Grossly intact. Skin: Warm and dry, no jaundice.   Psych: Alert and cooperative, normal mood and affect.  Labs:    Imaging Studies: CT ABDOMEN PELVIS W CONTRAST  Result Date: 07/26/2022 CLINICAL DATA:  Abdominal bloating and 30 lb weight gain in past 2 months. Nausea. Fatigue. EXAM: CT ABDOMEN AND PELVIS WITH CONTRAST TECHNIQUE: Multidetector CT imaging of the abdomen and pelvis was performed using the standard protocol following bolus administration of intravenous contrast. RADIATION DOSE REDUCTION: This exam was performed according to the departmental dose-optimization program which includes automated exposure control, adjustment of the mA and/or kV according to patient size and/or use of iterative reconstruction technique. CONTRAST:  1105m OMNIPAQUE IOHEXOL 300 MG/ML  SOLN COMPARISON:  06/27/2021 FINDINGS: Lower Chest: No acute findings. Hepatobiliary: No hepatic masses identified. Prior cholecystectomy. No evidence of biliary obstruction. Pancreas:  No mass or inflammatory changes. Spleen: Within normal limits in size and appearance. Adrenals/Urinary Tract: Stable small left adrenal nodule, consistent with benign adenoma (no followup imaging is recommended). No suspicious masses identified. No evidence of ureteral calculi or hydronephrosis. Unremarkable unopacified urinary bladder. Stomach/Bowel: Prior gastric bypass surgery again noted. No evidence of obstruction, inflammatory process or abnormal fluid collections. Vascular/Lymphatic: No pathologically enlarged lymph nodes. No acute vascular findings. Aortic atherosclerotic calcification incidentally noted. Reproductive: Prior hysterectomy noted. Adnexal regions are unremarkable in appearance. Other:  None.  No evidence of ascites. Musculoskeletal:  No suspicious  bone lesions identified. IMPRESSION: No acute findings or other significant abnormality within the abdomen or pelvis. Aortic Atherosclerosis (ICD10-I70.0). Electronically Signed   By: JMarlaine HindM.D.   On: 07/26/2022 12:04    Assessment and Plan:   LAKIA MONTALBANis a 77y.o. y/o female who comes in with chronic bloating with gastric bypass surgery and abdominal distention with a CT scan showing no sign of any abnormalities to explain her symptoms. The patient has been given a handout about a low FODMAP diet and will have her dicyclomine and Protonix refilled.  The patient has been explained the pathophysiology of bloating and her abdominal distention.  The patient has been explained the plan and agrees with it.     DLucilla Lame MD. FMarval Regal   Note: This dictation was prepared with Dragon dictation along with smaller phrase technology. Any transcriptional errors that result from this process are unintentional.

## 2022-08-06 NOTE — Patient Instructions (Addendum)
Blood pressure checked at the end of visit after 20-30 minutes of face-to-face with physician. Patient advised to recheck and monitor at home, reach out to cardiologist. Patient expressed understanding

## 2022-08-07 DIAGNOSIS — E113393 Type 2 diabetes mellitus with moderate nonproliferative diabetic retinopathy without macular edema, bilateral: Secondary | ICD-10-CM | POA: Diagnosis not present

## 2022-08-07 MED ORDER — DICYCLOMINE HCL 20 MG PO TABS: 20.0000 mg | ORAL_TABLET | Freq: Three times a day (TID) | ORAL | 2 refills | Status: DC

## 2022-08-07 MED ORDER — PANTOPRAZOLE SODIUM 40 MG PO TBEC: 40.0000 mg | DELAYED_RELEASE_TABLET | Freq: Every day | ORAL | 2 refills | Status: DC

## 2022-08-07 NOTE — Addendum Note (Signed)
Addended by: Lurlean Nanny on: 08/07/2022 01:48 PM   Modules accepted: Orders

## 2022-08-15 ENCOUNTER — Ambulatory Visit (INDEPENDENT_AMBULATORY_CARE_PROVIDER_SITE_OTHER): Payer: Medicare Other | Admitting: Neurology

## 2022-08-15 ENCOUNTER — Encounter: Payer: Self-pay | Admitting: Neurology

## 2022-08-15 ENCOUNTER — Telehealth: Payer: Self-pay | Admitting: Neurology

## 2022-08-15 ENCOUNTER — Encounter: Payer: Self-pay | Admitting: Internal Medicine

## 2022-08-15 VITALS — BP 142/73 | HR 69 | Ht 64.0 in | Wt 248.0 lb

## 2022-08-15 DIAGNOSIS — R519 Headache, unspecified: Secondary | ICD-10-CM

## 2022-08-15 DIAGNOSIS — M542 Cervicalgia: Secondary | ICD-10-CM | POA: Diagnosis not present

## 2022-08-15 DIAGNOSIS — G8929 Other chronic pain: Secondary | ICD-10-CM | POA: Diagnosis not present

## 2022-08-15 MED ORDER — DULOXETINE HCL 30 MG PO CPEP: 30.0000 mg | ORAL_CAPSULE | Freq: Every day | ORAL | 5 refills | Status: DC

## 2022-08-15 NOTE — Telephone Encounter (Signed)
Referral for Neurosurgery fax to [facility]. Phone: [phone], Fax: [phone]. 

## 2022-08-15 NOTE — Progress Notes (Signed)
Chief Complaint  Patient presents with   Follow-up    RM 12 with spouse Denise Sharp Pt is well, reports headaches have not improved since last visit.       ASSESSMENT AND PLAN  Denise Sharp is a 77 y.o. female   1.  Diabetic peripheral neuropathy 2.  Near syncope episode 3. OSA, using CPAP 4.  Chronic neck pain, headache, light sensitivity -Start Cymbalta 30 mg daily for neck pain, headache -Headaches are consistent with cervicogenic, but potentially have migraine component  -We discussed considering CGRP in the future, she is not interested in Botox, may also consider referral to pain clinic for consideration of cervical injections  -MRI cervical spine has shown mild canal stenosis at C5-6 with severe right and moderate left neuroforaminal narrowing  -MRI of brain and neck showed mild to moderate small vessel disease -We discussed PT for the neck pain, potentially neuromuscular therapy, has not been helpful, but not clear she has done neuromuscular -If Cymbalta is helpful, can increase the dose, I will see her back in 6 months   DIAGNOSTIC DATA (LABS, IMAGING, TESTING) - I reviewed patient records, labs, notes, testing and imaging myself where available.   MEDICAL HISTORY:  Denise Sharp, is a 77 year old female, accompanied by her husband Denise Sharp, seen in request by her [facility] care doctor Denise Sharp for evaluation of dizziness episode, she was patient of Dr. Jannifer Sharp in the past,  I reviewed and summarized the referring note. PMHX. HLD DM CAD s/p stent Urinary incontinence,  Gastric Bypass twice,  Bilateral Knee replacement    She has diabetic peripheral neuropathy, unsteady gait, chronic low back pain, limited activity, mostly sedentary, reported few episodes of lightheadedness, near passing out over the past few weeks  Few months ago, she was at [facility] fair, had breakfast, walk few isles, around 2 PM, while she was ready for her lunch, riding on her scooter,  she suddenly felt lightheaded, wooziness sensation, difficulty focusing, her husband has to help her sliding down to the chair by the table, did not loss of consciousness, after eating only small amount of meal, she felt better, glucose level was 170 range,  She reported few episode of near passing out in the past, sometimes related to low glucose,  Personally reviewed MRI of the brain March 04, 2022, mild small vessel disease, no acute abnormality MRI of cervical spine, multilevel degenerative changes, no significant canal stenosis, variable degree of foraminal narrowing Nuclear medicine myocardial perfusion study, normal wall motion, normal ejection fraction, CT imaging showed calcification in the LAD, low risk scan  She did have a history of irregular heart rate in the past,  UPDATE Jun 11 2022: She is accompanied by her husband at today's clinical visit, complains of feeling miserable all the time, moderate daily headache, standing from occipital region, spreading forward, constant moderate, sometimes to the point of light noise sensitivity, has to close her eyes, resting for a while,  Also complains of hearing loss, seen by ENT, also evaluated for her dizziness, reported no significant vestibular dysfunction  She has progressive worsening diabetic peripheral neuropathy, numbness from knee down, gait abnormality,  Recent cardiac evaluation, cardiac catheter showed no significant coronary artery disease, patent stent,  Cardiac Cath on Jun 04 2022:  Mild-moderate, non-obstructive coronary artery disease, similar to prior catheterization in 08/2020. Widely patent ostial LAD stent. Patent mid LAD stent with minimal in stent restenosis (~10% narrowing). Upper normal left and right heart filling pressures. Borderline elevated  pulmonary artery pressure. Normal to mildly reduced cardiac output/index.  Update August 15, 2022 SS: Is not interested in Botox for headaches. Has seen neurosurgery  about the neck, nothing surgical, injections not discussed at the time her diabetes was under poor control. Headaches are occipital, feel better in a dark room. PCP gives Fioricet as needed, sparingly, no more than 3 a week. Spends most of the day in bed, due to a variety of reasons of her poor health. More than 3 hours of activity makes her physically exhausted. Got iron infusion. Has gained 30 lbs since November.   PHYSICAL EXAM:   Vitals:   08/15/22 1419 08/15/22 1424  BP: (!) 142/66 (!) 142/73  Pulse: 69 69  Weight: 248 lb (112.5 kg)   Height: '5\' 4"'$  (1.626 m)     PHYSICAL EXAMNIATION:  Gen: NAD, conversant, well nourised, well groomed                      NEUROLOGICAL EXAM:  MENTAL STATUS: Speech/cognition: Deconditioned, obese elderly female, alert, oriented to history taking and casual conversation CRANIAL NERVES: CN II: Visual fields are full to confrontation. Pupils are round equal and briskly reactive to light. CN III, IV, VI: extraocular movement are normal. No ptosis. CN V: Facial sensation is intact to light touch CN VII: Face is symmetric with normal eye closure  CN VIII: Hearing is normal to causal conversation. CN IX, X: Phonation is normal. CN XI: Head turning and shoulder shrug are intact  MOTOR: no significant muscle weakness, overall deconditioned   REFLEXES: Reflexes are 2+ and symmetric   Sensory: Length dependent decreased to light touch  COORDINATION: There is no trunk or limb dysmetria noted.  GAIT/STANCE: Push-up to get up from seated position, cautious, unsteady, uses walker in the hallway  REVIEW OF SYSTEMS:  Full 14 system review of systems performed and notable only for as above  See HPI  ALLERGIES: Allergies  Allergen Reactions   Demeclocycline Hives   Erythromycin Nausea And Vomiting and Other (See Comments)    Severe irritable bowel   Flagyl [Metronidazole] Nausea And Vomiting and Other (See Comments)    Severe irritable bowel    Glucophage [Metformin Hcl] Nausea And Vomiting and Other (See Comments)    "Sick" "I won't take anything that has metformin in it"   Tetracyclines & Related Hives and Rash   Diovan [Valsartan] Nausea Only        Xanax [Alprazolam] Other (See Comments)    Hyperactivity     HOME MEDICATIONS: Current Outpatient Medications  Medication Sig Dispense Refill   albuterol (VENTOLIN HFA) 108 (90 Base) MCG/ACT inhaler Inhale 1-2 puffs into the lungs every 6 (six) hours as needed for wheezing or shortness of breath. 18 g 2   aspirin EC 81 MG tablet Take 81 mg by mouth daily. Swallow whole.     atorvastatin (LIPITOR) 80 MG tablet Take 1 tablet (80 mg total) by mouth daily. 90 tablet 1   benzonatate (TESSALON) 200 MG capsule Take 1 capsule (200 mg total) by mouth 2 (two) times daily as needed for cough. 20 capsule 0   butalbital-acetaminophen-caffeine (FIORICET) 50-325-40 MG tablet TAKE 1 TABLET BY MOUTH EVERY 6 HOURS AS NEEDED FOR HEADACHE. 60 tablet 2   Cholecalciferol (VITAMIN D3) 250 MCG (10000 UT) capsule Take 10,000 Units by mouth daily.     clotrimazole-betamethasone (LOTRISONE) cream Apply 1 application topically 2 (two) times daily as needed (irritation).     conjugated estrogens (  PREMARIN) vaginal cream Apply one pea-sized amount around the opening of the urethra daily for 2 weeks, then 3 times weekly moving forward. (Patient taking differently: Apply one pea-sized amount around the opening of the urethra  3 times weekly) 30 g 4   Continuous Blood Gluc Receiver (FREESTYLE LIBRE 2 READER) DEVI Use to check sugar at least TID 1 each 1   Continuous Blood Gluc Sensor (FREESTYLE LIBRE 2 SENSOR) MISC 1 application  by Does not apply route every 14 (fourteen) days. 2 each 0   cyanocobalamin (,VITAMIN B-12,) 1000 MCG/ML injection Inject 1 mL (1,000 mcg total) into the muscle every 30 (thirty) days. Inject 1 ml (1000 mcg ) IM weekly x 4,  Then monthly thereafter 3 mL 1   dapagliflozin propanediol (FARXIGA)  10 MG TABS tablet Take 1 tablet (10 mg total) by mouth daily. 90 tablet 3   dicyclomine (BENTYL) 20 MG tablet Take 1 tablet (20 mg total) by mouth 3 (three) times daily before meals. 180 tablet 2   diphenoxylate-atropine (LOMOTIL) 2.5-0.025 MG tablet Take 1 tablet by mouth 4 (four) times daily as needed for diarrhea or loose stools. 60 tablet 5   DULoxetine (CYMBALTA) 30 MG capsule Take 1 capsule (30 mg total) by mouth daily. 30 capsule 5   fexofenadine (ALLEGRA) 180 MG tablet Take 180 mg by mouth as needed for allergies or rhinitis.     fluticasone (FLONASE) 50 MCG/ACT nasal spray Place 2 sprays into both nostrils daily. (Patient taking differently: Place 2 sprays into both nostrils 3 (three) times daily as needed for allergies.) 16 g 6   glucose blood (ONETOUCH VERIO) test strip Use to check blood sugar twice daily. 100 each 1   insulin aspart (NOVOLOG FLEXPEN) 100 UNIT/ML FlexPen Inject 4-16 units with meals per sliding scale: If pre-meal glucose <100, inject 4 units; 100-150 - inject 6 units, 151-200 - inject 8 units; 201-250 - inject 10 units; 251-300 - inject 12 units, 301-350 - inject 14 units ; 350 > inject 16 units (Patient taking differently: Inject 4-14 Units into the skin 3 (three) times daily.) 15 mL 3   insulin degludec (TRESIBA FLEXTOUCH) 100 UNIT/ML FlexTouch Pen Inject 20 Units into the skin daily. 15 mL 3   Insulin Pen Needle (DROPLET PEN NEEDLES) 31G X 5 MM MISC USE ONE NEEDLE SUBCUTANEOUSLY AS DIRECTED (REMOVE AND DISCARD NEEDLE IN SHARPS CONTAINER IMMEDIATELY AFTER USE) 100 each 1   loperamide (IMODIUM) 2 MG capsule Take 4-6 mg by mouth 3 (three) times daily before meals.     magnesium gluconate (MAGONATE) 500 MG tablet Take 500 mg by mouth daily.     metoprolol succinate (TOPROL XL) 25 MG 24 hr tablet Take 1 tablet (25 mg total) by mouth daily. 90 tablet 3   nitroGLYCERIN (NITROSTAT) 0.4 MG SL tablet Place 1 tablet (0.4 mg total) under the tongue every 5 (five) minutes as needed  for chest pain. 25 tablet 0   nystatin (MYCOSTATIN/NYSTOP) powder Apply 1 g topically 4 (four) times daily as needed (rash).     ondansetron (ZOFRAN-ODT) 4 MG disintegrating tablet Take 1 tablet (4 mg total) by mouth every 8 (eight) hours as needed for nausea or vomiting. 30 tablet 0   OneTouch Delica Lancets 92E MISC Used to check blood sugar two times a day. 100 each 4   OVER THE COUNTER MEDICATION Apply 1 application topically daily as needed (pain). [place] Gel topical pain reliever     pantoprazole (PROTONIX) 40 MG tablet Take 1  tablet (40 mg total) by mouth daily. 90 tablet 2   Polyethyl Glycol-Propyl Glycol (SYSTANE OP) Place 1 drop into both eyes 4 (four) times daily as needed (dry eyes).     pregabalin (LYRICA) 50 MG capsule TAKE 1 CAPSULE BY MOUTH TWICE A DAY 60 capsule 2   No current facility-administered medications for this visit.    PAST MEDICAL HISTORY: Past Medical History:  Diagnosis Date   (HFpEF) heart failure with preserved ejection fraction ([place])    a. 12/2019 Echo: EF 65-70%, Gr2 DD. No significant valvular dzs.   Abnormality of gait 03/25/2013   Adrenal mass, left (HCC)    Anginal pain (HCC)    Anxiety    Aortic atherosclerosis (HCC)    Arthritis    Asthma    CAD (coronary artery disease) with h/o Atypical Chest Pain    a. 04/1986 Cath ([facility]): nl cors, EF 65%; b. 07/2012 Cath [facility]): Diff minor irregs; c. 07/2016 MV [facility]): "Equivocal"; d. 08/2016 Cardiac CT Ca2+ score [facility]): Ca2+ 3154; e. 05/2019 MV: No ischemia. EF 75%; f. 12/2019 PCI: LM nl, LAD 65ost (3.5x12 Resolute DES), 73m(2.75x12 Resolute DES),LCX/RCA nl; g. 08/2020 Cath: patent LAD stents, RCA 40ost, elev RH pressures, EF >65%.   Cervical spinal stenosis 1994   due to trauma to back ([employer] accident), has intermittent paralysis and parasthesias   Cervicogenic headache 03/23/2014   CKD (chronic kidney disease), stage III (HCC)    Depression    Diverticulosis    Dizziness    a.) chronic   DJD (degenerative  joint disease)    a. Chronic R shoulder pain   Dyspnea    Esophageal stenosis 09/2009   a.) transient outlet obstruction by food, cleared by EGD   Family history of adverse reaction to anesthesia    a.) daughter with (+) PONV   Gastric bypass status for obesity    GERD (gastroesophageal reflux disease)    Headache(784.0)    HLD (hyperlipidemia)    Hypertension    IBS (irritable bowel syndrome)    IDA (iron deficiency anemia)    a.) post 2 unit txfsn 2009, normal endo/colonoscopy by [facility]   Left bundle branch block (LBBB)    a.) Intermittently present - likely rate related.   NSVT (nonsustained ventricular tachycardia) ([place] 11/15/2020   a.) Holter study 11/15/2020; 11 episodes of NSVT lasting up to 8 beats with a maximum rate of 210 bpm; 14 atrial runs lasting up to 18 beats with a rate of up to 250 bpm.  Some atrial runs felt to be NSVT.   NSVT (nonsustained ventricular tachycardia) ([place]    a. 10/2020 [facility]: 11 runs of NSVT up to 8 beats.   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 10/2020 [facility]: 14 atrial runs up to 18 beats, max HR 250.   Restless leg syndrome    Rotator cuff arthropathy, right 08/13/2017   Syncope and collapse 03/12/2014   Type II diabetes mellitus ([place]     PAST SURGICAL HISTORY: Past Surgical History:  Procedure Laterality Date   APPENDECTOMY     BACK SURGERY     CHOLECYSTECTOMY N/A 1976   COLONOSCOPY     CORONARY ANGIOPLASTY     CORONARY STENT INTERVENTION N/A 01/04/2020   Procedure: CORONARY STENT INTERVENTION;  Surgeon: AWellington Hampshire MD;  [facility];  Service: Cardiovascular;  Laterality: N/A;  LAD    DIAPHRAGMATIC HERNIA REPAIR  2015   ESOPHAGEAL DILATION     multiple   ESOPHAGOGASTRODUODENOSCOPY (EGD) WITH PROPOFOL N/A 05/11/2021   Procedure: ESOPHAGOGASTRODUODENOSCOPY (EGD) WITH  PROPOFOL;  Surgeon: [name], MD;  Location: [facility];  Service: Endoscopy;  Laterality: N/A;   ESOPHAGOGASTRODUODENOSCOPY (EGD) WITH PROPOFOL N/A 06/13/2021   Procedure: ESOPHAGOGASTRODUODENOSCOPY (EGD) WITH PROPOFOL;  Surgeon: [name], MD;  Location: [facility];  Service: Endoscopy;  Laterality: N/A;   EYE SURGERY     GASTRIC BYPASS  2000, 2005   Dr. [name] IMPLANT PLACEMENT  10/2011   Hollice Gong IMPLANT PLACEMENT N/A 09/18/2021   Procedure: Barrie Lyme IMPLANT FIRST STAGE;  Surgeon: Bjorn Loser, MD;  Location: [facility];  Service: Urology;  Laterality: N/A;   INTERSTIM IMPLANT PLACEMENT N/A 09/18/2021   Procedure: Barrie Lyme IMPLANT SECOND STAGE WITH IMPEDENCE CHECK;  Surgeon: Bjorn Loser, MD;  Location: [facility];  Service: Urology;  Laterality: N/A;   INTERSTIM IMPLANT REMOVAL N/A 09/18/2021   Procedure: REMOVAL OF INTERSTIM IMPLANT;  Surgeon: Bjorn Loser, MD;  Location: [facility];  Service: Urology;  Laterality: N/A;   LEFT HEART CATH AND CORONARY ANGIOGRAPHY N/A 04/1986   LEFT HEART CATH AND CORONARY ANGIOGRAPHY Left 07/24/2012   LEFT HEART CATH AND CORS/GRAFTS ANGIOGRAPHY N/A 12/31/2019   Procedure: LEFT HEART CATH AND CORS/GRAFTS ANGIOGRAPHY;  Surgeon: [name], MD;  Location: [facility];  Service: Cardiovascular;  Laterality: N/A;   PANNICULECTOMY N/A 06/16/2019   Procedure: PANNICULECTOMY;  Surgeon: [name], MD;  Location: [facility];  Service: Plastics;  Laterality: N/A;  3 hours, please   REVERSE SHOULDER ARTHROPLASTY Right 08/13/2017   Procedure: REVERSE RIGHT SHOULDER ARTHROPLASTY;  Surgeon: [name], MD;  Location: [facility];  Service: Orthopedics;  Laterality: Right;   RIGHT/LEFT HEART CATH AND CORONARY ANGIOGRAPHY N/A 09/06/2020   Procedure: RIGHT/LEFT HEART CATH AND CORONARY ANGIOGRAPHY;  Surgeon: [name], MD;  Location: [facility];  Service: Cardiovascular;  Laterality: N/A;    RIGHT/LEFT HEART CATH AND CORONARY ANGIOGRAPHY N/A 06/04/2022   Procedure: RIGHT/LEFT HEART CATH AND CORONARY ANGIOGRAPHY;  Surgeon: [name], MD;  Location: [facility];  Service: Cardiovascular;  Laterality: N/A;   ROTATOR CUFF REPAIR Right    SPINE SURGERY  1995   [place]   TOOTH EXTRACTION  11/13/2021   TOTAL ABDOMINAL HYSTERECTOMY W/ BILATERAL SALPINGOOPHORECTOMY  1974   TOTAL KNEE ARTHROPLASTY Bilateral 2007   Procedure: TOTAL KNEE ARTHROPLASTY; Surgeons: Mauri Pole, MD and [name], MD   UMBILICAL HERNIA REPAIR  08/11/2015    FAMILY HISTORY: Family History  Problem Relation Age of Onset   Heart disease Father    Hypertension Father    Prostate cancer Father    Stroke Father    Osteoporosis Father    Stroke Mother    Depression Mother    Headache Mother    Heart disease Mother    Thyroid disease Mother    Hypertension Mother    Diabetes Daughter    Heart disease Daughter    Hypertension Daughter    Hypertension Son     SOCIAL HISTORY: Social History   Socioeconomic History   Marital status: Married    Spouse name: Denise Sharp    Number of children: 2   Years of education: College    Highest education level: Not on file  Occupational History    Employer: retired  Tobacco Use   Smoking status:  Never    Passive exposure: Never   Smokeless tobacco: Never  Vaping Use   Vaping Use: Never used  Substance and Sexual Activity   Alcohol use: No   Drug use: Not Currently    Comment: prescribed valium   Sexual activity: Not Currently    Birth control/protection: Post-menopausal, Surgical  Other Topics Concern   Not on file  Social History Narrative   Patient lives at home with husband Denise Sharp.    Right Handed   Drinks caffeinated tea occasionally   Social Determinants of Health   Financial Resource Strain: Low Risk  (09/15/2021)   Overall Financial Resource Strain (CARDIA)    Difficulty of Paying Living Expenses: Not hard at all  Food Insecurity: No Food  Insecurity (10/19/2020)   Hunger Vital Sign    Worried About Running Out of Food in the Last Year: Never true    Ran Out of Food in the Last Year: Never true  Transportation Needs: No Transportation Needs (10/19/2020)   PRAPARE - Hydrologist (Medical): No    Lack of Transportation (Non-Medical): No  Physical Activity: Insufficiently Active (11/15/2020)   Exercise Vital Sign    Days of Exercise per Week: 2 days    Minutes of Exercise per Session: 40 min  Stress: Stress Concern Present (08/03/2020)   [place]    Feeling of Stress : Rather much  Social Connections: Not on file  Intimate Partner Violence: Not At Risk (10/19/2020)   Humiliation, Afraid, Rape, and Kick questionnaire    Fear of Current or Ex-Partner: No    Emotionally Abused: No    Physically Abused: No    Sexually Abused: No    Butler Denmark, Laqueta Jean, DNP  [facility] [address], [place], [place] [address] [phone]

## 2022-08-15 NOTE — Patient Instructions (Signed)
I will start Cymbalta 30 mg daily to help your neck pain, headache, if you like it, we can increase the dose if needed  Follow up in 6 months

## 2022-08-16 ENCOUNTER — Encounter: Payer: Self-pay | Admitting: Internal Medicine

## 2022-08-21 ENCOUNTER — Ambulatory Visit (INDEPENDENT_AMBULATORY_CARE_PROVIDER_SITE_OTHER): Payer: Medicare Other

## 2022-08-21 DIAGNOSIS — M2042 Other hammer toe(s) (acquired), left foot: Secondary | ICD-10-CM | POA: Diagnosis not present

## 2022-08-21 DIAGNOSIS — M2041 Other hammer toe(s) (acquired), right foot: Secondary | ICD-10-CM

## 2022-08-21 DIAGNOSIS — E1149 Type 2 diabetes mellitus with other diabetic neurological complication: Secondary | ICD-10-CM | POA: Diagnosis not present

## 2022-08-28 ENCOUNTER — Telehealth: Payer: Self-pay | Admitting: Neurology

## 2022-08-28 MED ORDER — DULOXETINE HCL 30 MG PO CPEP: 30.0000 mg | ORAL_CAPSULE | Freq: Every day | ORAL | 4 refills | Status: DC

## 2022-08-28 NOTE — Telephone Encounter (Signed)
Refill sent to [facility].

## 2022-08-28 NOTE — Telephone Encounter (Signed)
Pt called stating that she is [facility] Please advise.

## 2022-08-30 ENCOUNTER — Ambulatory Visit: Payer: Medicare Other | Admitting: Podiatry

## 2022-08-30 ENCOUNTER — Telehealth: Payer: Medicare Other | Admitting: Nurse Practitioner

## 2022-08-30 ENCOUNTER — Telehealth: Payer: Medicare Other | Admitting: Internal Medicine

## 2022-08-30 ENCOUNTER — Telehealth (INDEPENDENT_AMBULATORY_CARE_PROVIDER_SITE_OTHER): Payer: Medicare Other | Admitting: Internal Medicine

## 2022-08-30 ENCOUNTER — Encounter: Payer: Self-pay | Admitting: Internal Medicine

## 2022-08-30 VITALS — Ht 64.0 in | Wt 248.0 lb

## 2022-08-30 DIAGNOSIS — U071 COVID-19: Secondary | ICD-10-CM

## 2022-08-30 NOTE — Patient Instructions (Signed)
You are no longer contagious and you may leave the house,  but I would wear a mask until your runny nose resolves  Use Benadryl for the runnny nose Afrin nasal spray every 12 hours for 5 days ONLY   for congestion Tylenol 1000 mg every 12 hours for headache Zofran for the nausea Tessalon perles for the cough

## 2022-08-30 NOTE — Progress Notes (Signed)
Virtual Visit via [place]   Note   This format is felt to be most appropriate for this patient at this time.  All issues noted in this document were discussed and addressed.  No physical exam was performed (except for noted visual exam findings with Video Visits).   I connected with Denise Sharp  on 08/30/22 at 12:15 PM EST by a video enabled telemedicine application and verified that I am speaking with the correct person using two identifiers. Location patient: home Location provider: work or home office Persons participating in the virtual visit: patient, provider  I discussed the limitations, risks, security and privacy concerns of performing an evaluation and management service by telephone and the availability of in person appointments. I also discussed with the patient that there may be a patient responsible charge related to this service. The patient expressed understanding and agreed to proceed.   Reason for visit: COVID INFECTION   HPI:  77 YR OLD FEMALE with type 2 DM , morbid obesity with OHS/OSA, CAD presents for follow up on COVID infection. . She has been feeing poorly since Feb 4..  tested positive on Feb 9  .Did not have fevers or  dyspnea but cough, anorexia, nausea and loose stools.  Had a formed stool yesterday x 3 , but still having  nausea which she is attributing somewhat to excessive  PND    ROS: See pertinent positives and negatives per HPI.  Past Medical History:  Diagnosis Date   (HFpEF) heart failure with preserved ejection fraction ([place])    a. 12/2019 Echo: EF 65-70%, Gr2 DD. No significant valvular dzs.   Abnormality of gait 03/25/2013   Adrenal mass, left (HCC)    Anginal pain (HCC)    Anxiety    Aortic atherosclerosis (HCC)    Arthritis    Asthma    CAD (coronary artery disease) with h/o Atypical Chest Pain    a. 04/1986 Cath ([facility]): nl cors, EF 65%; b. 07/2012 Cath [facility]): Diff minor irregs; c. 07/2016 MV [facility]): "Equivocal"; d. 08/2016 Cardiac CT Ca2+ score  [facility]): Ca2+ [id number]; e. 05/2019 MV: No ischemia. EF 75%; f. 12/2019 PCI: LM nl, LAD 65ost (3.5x12 Resolute DES), 36m(2.75x12 Resolute DES),LCX/RCA nl; g. 08/2020 Cath: patent LAD stents, RCA 40ost, elev RH pressures, EF >65%.   Cervical spinal stenosis 1994   due to trauma to back ([employer] accident), has intermittent paralysis and parasthesias   Cervicogenic headache 03/23/2014   CKD (chronic kidney disease), stage III (HCC)    Depression    Diverticulosis    Dizziness    a.) chronic   DJD (degenerative joint disease)    a. Chronic R shoulder pain   Dyspnea    Esophageal stenosis 09/2009   a.) transient outlet obstruction by food, cleared by EGD   Family history of adverse reaction to anesthesia    a.) daughter with (+) PONV   Gastric bypass status for obesity    GERD (gastroesophageal reflux disease)    Headache(784.0)    HLD (hyperlipidemia)    Hypertension    IBS (irritable bowel syndrome)    IDA (iron deficiency anemia)    a.) post 2 unit txfsn 2009, normal endo/colonoscopy by [facility]   Left bundle branch block (LBBB)    a.) Intermittently present - likely rate related.   NSVT (nonsust[place]cardia[place] 11/15[place]   a.) Holter study 11/15/2020; 11 episodes of NSVT lasting up to 8 beats with a maximum rate of 210 bpm; 14 atrial runs lasting  up to 18 beats with a rate of up to 250 bpm.  Some atrial runs felt to be NSVT.   NSVT (nonsustained ventricular tachycardia) ([place])    a. 10/2020 [facility]: 11 runs of NSVT up to 8 beats.   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 10/2020 [facility]: 14 atrial runs up to 18 beats, max HR 250.   Restless leg syndrome    Rotator cuff arthropathy, right 08/13/2017   Syncope and collapse 03/12/2014   Type II diabetes mellitus ([place])     Past Surgical History:  Procedure Laterality Date    APPENDECTOMY     BACK SURGERY     CHOLECYSTECTOMY N/A 1976   COLONOSCOPY     CORONARY ANGIOPLASTY     CORONARY STENT INTERVENTION N/A 01/04/2020   Procedure: CORONARY STENT INTERVENTION;  Surgeon: [name], MD;  Location: [place] [facility];  Service: Cardiovascular;  Laterality: N/A;  LAD    DIAPHRAGMATIC HERNIA REPAIR  2015   ESOPHAGEAL DILATION     multiple   ESOPHAGOGASTRODUODENOSCOPY (EGD) WITH PROPOFOL N/A 05/11/2021   Procedure: ESOPHAGOGASTRODUODENOSCOPY (EGD) WITH PROPOFOL;  Surgeon: [name], MD;  Location: [facility];  Service: Endoscopy;  Laterality: N/A;   ESOPHAGOGASTRODUODENOSCOPY (EGD) WITH PROPOFOL N/A 06/13/2021   Procedure: ESOPHAGOGASTRODUODENOSCOPY (EGD) WITH PROPOFOL;  Surgeon: [name], MD;  Location: [facility];  Service: Endoscopy;  Laterality: N/A;   EYE SURGERY     GASTRIC BYPASS  2000, 2005   Dr. [name] IMPLANT PLACEMENT  10/2011   Hollice Gong IMPLANT PLACEMENT N/A 09/18/2021   Procedure: Barrie Lyme IMPLANT FIRST STAGE;  Surgeon: Bjorn Loser, MD;  Location: [facility];  Service: Urology;  Laterality: N/A;   INTERSTIM IMPLANT PLACEMENT N/A 09/18/2021   Procedure: Barrie Lyme IMPLANT SECOND STAGE WITH IMPEDENCE CHECK;  Surgeon: Bjorn Loser, MD;  Location: [facility];  Service: Urology;  Laterality: N/A;   INTERSTIM IMPLANT REMOVAL N/A 09/18/2021   Procedure: REMOVAL OF INTERSTIM IMPLANT;  Surgeon: Bjorn Loser, MD;  Location: [facility];  Service: Urology;  Laterality: N/A;   LEFT HEART CATH AND CORONARY ANGIOGRAPHY N/A 04/1986   LEFT HEART CATH AND CORONARY ANGIOGRAPHY Left 07/24/2012   LEFT HEART CATH AND CORS/GRAFTS ANGIOGRAPHY N/A 12/31/2019   Procedure: LEFT HEART CATH AND CORS/GRAFTS ANGIOGRAPHY;  Surgeon: [name], MD;  Location: [place] [facility];  Service: Cardiovascular;  Laterality: N/A;   PANNICULECTOMY N/A 06/16/2019   Procedure: PANNICULECTOMY;  Surgeon: [name], MD;   Location: [place];  Service: Plastics;  Laterality: N/A;  3 hours, please   REVERSE SHOULDER ARTHROPLASTY Right 08/13/2017   Procedure: REVERSE RIGHT SHOULDER ARTHROPLASTY;  Surgeon: [name], MD;  Location: [place];  Service: Orthopedics;  Laterality: Right;   RIGHT/LEFT HEART CATH AND CORONARY ANGIOGRAPHY N/A 09/06/2020   Procedure: RIGHT/LEFT HEART CATH AND CORONARY ANGIOGRAPHY;  Surgeon: [name], MD;  Location: [place] [facility];  Service: Cardiovascular;  Laterality: N/A;   RIGHT/LEFT HEART CATH AND CORONARY ANGIOGRAPHY N/A 06/04/2022   Procedure: RIGHT/LEFT HEART CATH AND CORONARY ANGIOGRAPHY;  Surgeon: [name], MD;  Location: [place] [facility];  Service: Cardiovascular;  Laterality: N/A;   ROTATOR CUFF REPAIR Right    SPINE SURGERY  1995   [place]   TOOTH EXTRACTION  11/13/2021   TOTAL ABDOMINAL HYSTERECTOMY W/ BILATERAL SALPINGOOPHORECTOMY  1974   TOTAL KNEE ARTHROPLASTY Bilateral 2007   Procedure: TOTAL KNEE ARTHROPLASTY; Surgeons: Mauri Pole, MD and [name], MD   UMBILICAL HERNIA REPAIR  08/11/2015    Family History  Problem Relation Age of Onset   Heart disease Father    Hypertension Father    Prostate cancer Father    Stroke Father    Osteoporosis Father    Stroke Mother    Depression Mother    Headache Mother    Heart disease Mother    Thyroid disease Mother    Hypertension Mother    Diabetes Daughter    Heart disease Daughter    Hypertension Daughter    Hypertension Son     SOCIAL HX:  reports that she has never smoked. She has never been exposed to tobacco smoke. She has never used smokeless tobacco. She reports that she does not currently use drugs. She reports that she does not drink alcohol.    Current Outpatient Medications:    albuterol (VENTOLIN HFA) 108 (90 Base) MCG/ACT inhaler, Inhale 1-2 puffs into the lungs every 6 (six) hours as needed for wheezing or shortness of breath., Disp: 18 g, Rfl: 2   aspirin EC 81 MG tablet, Take 81 mg by  mouth daily. Swallow whole., Disp: , Rfl:    atorvastatin (LIPITOR) 80 MG tablet, Take 1 tablet (80 mg total) by mouth daily., Disp: 90 tablet, Rfl: 1   benzonatate (TESSALON) 200 MG capsule, Take 1 capsule (200 mg total) by mouth 2 (two) times daily as needed for cough., Disp: 20 capsule, Rfl: 0   butalbital-acetaminophen-caffeine (FIORICET) 50-325-40 MG tablet, TAKE 1 TABLET BY MOUTH EVERY 6 HOURS AS NEEDED FOR HEADACHE., Disp: 60 tablet, Rfl: 2   Cholecalciferol (VITAMIN D3) 250 MCG (10000 UT) capsule, Take 10,000 Units by mouth daily., Disp: , Rfl:    clotrimazole-betamethasone (LOTRISONE) cream, Apply 1 application topically 2 (two) times daily as needed (irritation)., Disp: , Rfl:    conjugated estrogens (PREMARIN) vaginal cream, Apply one pea-sized amount around the opening of the urethra daily for 2 weeks, then 3 times weekly moving forward. (Patient taking differently: Apply one pea-sized amount around the opening of the urethra  3 times weekly), Disp: 30 g, Rfl: 4   Continuous Blood Gluc Receiver (FREESTYLE LIBRE 2 READER) DEVI, Use to check sugar at least TID, Disp: 1 each, Rfl: 1   Continuous Blood Gluc Sensor (FREESTYLE LIBRE 2 SENSOR) MISC, 1 application  by Does not apply route every 14 (fourteen) days., Disp: 2 each, Rfl: 0   cyanocobalamin (,VITAMIN B-12,) 1000 MCG/ML injection, Inject 1 mL (1,000 mcg total) into the muscle every 30 (thirty) days. Inject 1 ml (1000 mcg ) IM weekly x 4,  Then monthly thereafter, Disp: 3 mL, Rfl: 1   dapagliflozin propanediol (FARXIGA) 10 MG TABS tablet, Take 1 tablet (10 mg total) by mouth daily., Disp: 90 tablet, Rfl: 3   dicyclomine (BENTYL) 20 MG tablet, Take 1 tablet (20 mg total) by mouth 3 (three) times daily before meals., Disp: 180 tablet, Rfl: 2   diphenoxylate-atropine (LOMOTIL) 2.5-0.025 MG tablet, Take 1 tablet by mouth 4 (four) times daily as needed for diarrhea or loose stools., Disp: 60 tablet, Rfl: 5   DULoxetine (CYMBALTA) 30 MG capsule,  Take 1 capsule (30 mg total) by mouth daily., Disp: 90 capsule, Rfl: 4   fexofenadine (ALLEGRA) 180 MG tablet, Take 180 mg by mouth as needed for allergies or rhinitis., Disp: , Rfl:    fluticasone (FLONASE) 50  MCG/ACT nasal spray, Place 2 sprays into both nostrils daily. (Patient taking differently: Place 2 sprays into both nostrils 3 (three) times daily as needed for allergies.), Disp: 16 g, Rfl: 6   glucose blood (ONETOUCH VERIO) test strip, Use to check blood sugar twice daily., Disp: 100 each, Rfl: 1   insulin aspart (NOVOLOG FLEXPEN) 100 UNIT/ML FlexPen, Inject 4-16 units with meals per sliding scale: If pre-meal glucose <100, inject 4 units; 100-150 - inject 6 units, 151-200 - inject 8 units; 201-250 - inject 10 units; 251-300 - inject 12 units, 301-350 - inject 14 units ; 350 > inject 16 units (Patient taking differently: Inject 4-14 Units into the skin 3 (three) times daily.), Disp: 15 mL, Rfl: 3   insulin degludec (TRESIBA FLEXTOUCH) 100 UNIT/ML FlexTouch Pen, Inject 20 Units into the skin daily., Disp: 15 mL, Rfl: 3   Insulin Pen Needle (DROPLET PEN NEEDLES) 31G X 5 MM MISC, USE ONE NEEDLE SUBCUTANEOUSLY AS DIRECTED (REMOVE AND DISCARD NEEDLE IN SHARPS CONTAINER IMMEDIATELY AFTER USE), Disp: 100 each, Rfl: 1   loperamide (IMODIUM) 2 MG capsule, Take 4-6 mg by mouth 3 (three) times daily before meals., Disp: , Rfl:    magnesium gluconate (MAGONATE) 500 MG tablet, Take 500 mg by mouth daily., Disp: , Rfl:    metoprolol succinate (TOPROL XL) 25 MG 24 hr tablet, Take 1 tablet (25 mg total) by mouth daily., Disp: 90 tablet, Rfl: 3   nitroGLYCERIN (NITROSTAT) 0.4 MG SL tablet, Place 1 tablet (0.4 mg total) under the tongue every 5 (five) minutes as needed for chest pain., Disp: 25 tablet, Rfl: 0   nystatin (MYCOSTATIN/NYSTOP) powder, Apply 1 g topically 4 (four) times daily as needed (rash)., Disp: , Rfl:    ondansetron (ZOFRAN-ODT) 4 MG disintegrating tablet, Take 1 tablet (4 mg total) by mouth  every 8 (eight) hours as needed for nausea or vomiting., Disp: 30 tablet, Rfl: 0   OneTouch Delica Lancets 99991111 MISC, Used to check blood sugar two times a day., Disp: 100 each, Rfl: 4   OVER THE COUNTER MEDICATION, Apply 1 application topically daily as needed (pain). [place] Gel topical pain reliever, Disp: , Rfl:    pantoprazole (PROTONIX) 40 MG tablet, Take 1 tablet (40 mg total) by mouth daily., Disp: 90 tablet, Rfl: 2   Polyethyl Glycol-Propyl Glycol (SYSTANE OP), Place 1 drop into both eyes 4 (four) times daily as needed (dry eyes)., Disp: , Rfl:    pregabalin (LYRICA) 50 MG capsule, TAKE 1 CAPSULE BY MOUTH TWICE A DAY, Disp: 60 capsule, Rfl: 2  EXAM:  VITALS per patient if applicable:  GENERAL: alert, oriented, appears well and in no acute distress  HEENT: atraumatic, conjunttiva clear, no obvious abnormalities on inspection of external nose and ears  NECK: normal movements of the head and neck  LUNGS: on inspection no signs of respiratory distress, breathing rate appears normal, no obvious gross SOB, gasping or wheezing  CV: no obvious cyanosis  MS: moves all visible extremities without noticeable abnormality  PSYCH/NEURO: pleasant and cooperative, no obvious depression or anxiety, speech and thought processing grossly intact  ASSESSMENT AND PLAN: COVID-19 virus infection Assessment & Plan: Uncomplicated infection,  recovery in progress. Supportive care for management of symptoms outlined.        I discussed the assessment and treatment plan with the patient. The patient was provided an opportunity to ask questions and all were answered. The patient agreed with the plan and demonstrated an understanding of the instructions.  The patient was advised to call back or seek an in-person evaluation if the symptoms worsen or if the condition fails to improve as anticipated.   I spent 20 minutes dedicated to the care of this patient on the date of this encounter to include  pre-visit review of her medical history,  Face-to-face time with the patient , and post visit ordering of testing and therapeutics.    [name], MD

## 2022-09-01 DIAGNOSIS — U071 COVID-19: Secondary | ICD-10-CM | POA: Insufficient documentation

## 2022-09-01 NOTE — Assessment & Plan Note (Addendum)
Uncomplicated infection,  recovery in progress. Supportive care for management of symptoms outlined.

## 2022-09-05 DIAGNOSIS — Z9884 Bariatric surgery status: Secondary | ICD-10-CM | POA: Diagnosis not present

## 2022-09-05 DIAGNOSIS — D649 Anemia, unspecified: Secondary | ICD-10-CM | POA: Diagnosis not present

## 2022-09-05 DIAGNOSIS — R609 Edema, unspecified: Secondary | ICD-10-CM | POA: Diagnosis not present

## 2022-09-05 DIAGNOSIS — I10 Essential (primary) hypertension: Secondary | ICD-10-CM | POA: Diagnosis not present

## 2022-09-05 DIAGNOSIS — I509 Heart failure, unspecified: Secondary | ICD-10-CM | POA: Diagnosis not present

## 2022-09-05 DIAGNOSIS — E1122 Type 2 diabetes mellitus with diabetic chronic kidney disease: Secondary | ICD-10-CM | POA: Diagnosis not present

## 2022-09-05 DIAGNOSIS — N1832 Chronic kidney disease, stage 3b: Secondary | ICD-10-CM | POA: Diagnosis not present

## 2022-09-05 DIAGNOSIS — N3281 Overactive bladder: Secondary | ICD-10-CM | POA: Diagnosis not present

## 2022-09-05 DIAGNOSIS — E785 Hyperlipidemia, unspecified: Secondary | ICD-10-CM | POA: Diagnosis not present

## 2022-09-05 DIAGNOSIS — G4733 Obstructive sleep apnea (adult) (pediatric): Secondary | ICD-10-CM | POA: Diagnosis not present

## 2022-09-05 DIAGNOSIS — R809 Proteinuria, unspecified: Secondary | ICD-10-CM | POA: Diagnosis not present

## 2022-09-05 DIAGNOSIS — D508 Other iron deficiency anemias: Secondary | ICD-10-CM | POA: Diagnosis not present

## 2022-09-20 ENCOUNTER — Ambulatory Visit: Payer: Medicare Other | Admitting: Podiatry

## 2022-09-21 ENCOUNTER — Encounter: Payer: Self-pay | Admitting: Nurse Practitioner

## 2022-09-21 ENCOUNTER — Ambulatory Visit (INDEPENDENT_AMBULATORY_CARE_PROVIDER_SITE_OTHER): Payer: Medicare Other | Admitting: Nurse Practitioner

## 2022-09-21 VITALS — BP 110/70 | HR 108 | Temp 98.6°F | Ht 64.0 in | Wt 246.8 lb

## 2022-09-21 DIAGNOSIS — E1149 Type 2 diabetes mellitus with other diabetic neurological complication: Secondary | ICD-10-CM | POA: Diagnosis not present

## 2022-09-21 DIAGNOSIS — E160 Drug-induced hypoglycemia without coma: Secondary | ICD-10-CM

## 2022-09-21 DIAGNOSIS — T383X5A Adverse effect of insulin and oral hypoglycemic [antidiabetic] drugs, initial encounter: Secondary | ICD-10-CM

## 2022-09-21 MED ORDER — GVOKE HYPOPEN 2-PACK 0.5 MG/0.1ML ~~LOC~~ SOAJ: SUBCUTANEOUS | 2 refills | Status: DC

## 2022-09-21 NOTE — Progress Notes (Unsigned)
Denise Morrow, NP-C Phone: [phone]  Denise Sharp is a 77 y.o. female who presents today for low blood sugar.   Patient reports having recurrent problems with low blood sugar. She is a type 2 diabetic. She takes [place] daily and uses [place] and [employer]. She reports having to call 911 on Wednesday while out shopping due to almost passing out from her blood sugar dropping. She reports checking her FreeStyle Libre sensor which said her blood sugar was 69. She reports getting very lightheaded, weak, and felt at though she was hallucinating. She reports sitting down and drinking orange juice until EMS arrived, when they did her blood sugar was up to 96. She denies LOC. She did not go to the hospital. She reports this is not the first time an episode like this has occurred. She reports the episodes of hypoglycemia are occurring more often and are beginning to inhibit her life, due to the fears of having episodes while out. She reports taking her medications daily. She does report giving herself extra insulin during her meals because her sugar is high while she is eating.   DIABETES Disease Monitoring: Blood Sugar ranges- Under 170 Polyuria/phagia/dipsia- No      Optho- Yes Medications: Compliance- Farxiga, Tresiba, Novolog Hypoglycemic symptoms- Yes Lab Results  Component Value Date   HGBA1C 7.1 (H) 07/13/2022    Social History   Tobacco Use  Smoking Status Never   Passive exposure: Never  Smokeless Tobacco Never    Current Outpatient Medications on File Prior to Visit  Medication Sig Dispense Refill   albuterol (VENTOLIN HFA) 108 (90 Base) MCG/ACT inhaler Inhale 1-2 puffs into the lungs every 6 (six) hours as needed for wheezing or shortness of breath. 18 g 2   aspirin EC 81 MG tablet Take 81 mg by mouth daily. Swallow whole.     atorvastatin (LIPITOR) 80 MG tablet Take 1 tablet (80 mg total) by mouth daily. 90 tablet 1   benzonatate (TESSALON) 200 MG capsule Take 1 capsule (200  mg total) by mouth 2 (two) times daily as needed for cough. 20 capsule 0   butalbital-acetaminophen-caffeine (FIORICET) 50-325-40 MG tablet TAKE 1 TABLET BY MOUTH EVERY 6 HOURS AS NEEDED FOR HEADACHE. 60 tablet 2   Cholecalciferol (VITAMIN D3) 250 MCG (10000 UT) capsule Take 10,000 Units by mouth daily.     clotrimazole-betamethasone (LOTRISONE) cream Apply 1 application topically 2 (two) times daily as needed (irritation).     conjugated estrogens (PREMARIN) vaginal cream Apply one pea-sized amount around the opening of the urethra daily for 2 weeks, then 3 times weekly moving forward. (Patient taking differently: Apply one pea-sized amount around the opening of the urethra  3 times weekly) 30 g 4   Continuous Blood Gluc Receiver (FREESTYLE LIBRE 2 READER) DEVI Use to check sugar at least TID 1 each 1   Continuous Blood Gluc Sensor (FREESTYLE LIBRE 2 SENSOR) MISC 1 application  by Does not apply route every 14 (fourteen) days. 2 each 0   cyanocobalamin (,VITAMIN B-12,) 1000 MCG/ML injection Inject 1 mL (1,000 mcg total) into the muscle every 30 (thirty) days. Inject 1 ml (1000 mcg ) IM weekly x 4,  Then monthly thereafter 3 mL 1   dapagliflozin propanediol (FARXIGA) 10 MG TABS tablet Take 1 tablet (10 mg total) by mouth daily. 90 tablet 3   dicyclomine (BENTYL) 20 MG tablet Take 1 tablet (20 mg total) by mouth 3 (three) times daily before meals. 180 tablet 2   diphenoxylate-atropine (  LOMOTIL) 2.5-0.025 MG tablet Take 1 tablet by mouth 4 (four) times daily as needed for diarrhea or loose stools. 60 tablet 5   DULoxetine (CYMBALTA) 30 MG capsule Take 1 capsule (30 mg total) by mouth daily. 90 capsule 4   fexofenadine (ALLEGRA) 180 MG tablet Take 180 mg by mouth as needed for allergies or rhinitis.     fluticasone (FLONASE) 50 MCG/ACT nasal spray Place 2 sprays into both nostrils daily. (Patient taking differently: Place 2 sprays into both nostrils 3 (three) times daily as needed for allergies.) 16 g 6    glucose blood (ONETOUCH VERIO) test strip Use to check blood sugar twice daily. 100 each 1   insulin aspart (NOVOLOG FLEXPEN) 100 UNIT/ML FlexPen Inject 4-16 units with meals per sliding scale: If pre-meal glucose <100, inject 4 units; 100-150 - inject 6 units, 151-200 - inject 8 units; 201-250 - inject 10 units; 251-300 - inject 12 units, 301-350 - inject 14 units ; 350 > inject 16 units (Patient taking differently: Inject 4-14 Units into the skin 3 (three) times daily.) 15 mL 3   insulin degludec (TRESIBA FLEXTOUCH) 100 UNIT/ML FlexTouch Pen Inject 20 Units into the skin daily. 15 mL 3   Insulin Pen Needle (DROPLET PEN NEEDLES) 31G X 5 MM MISC USE ONE NEEDLE SUBCUTANEOUSLY AS DIRECTED (REMOVE AND DISCARD NEEDLE IN SHARPS CONTAINER IMMEDIATELY AFTER USE) 100 each 1   loperamide (IMODIUM) 2 MG capsule Take 4-6 mg by mouth 3 (three) times daily before meals.     magnesium gluconate (MAGONATE) 500 MG tablet Take 500 mg by mouth daily.     metoprolol succinate (TOPROL XL) 25 MG 24 hr tablet Take 1 tablet (25 mg total) by mouth daily. 90 tablet 3   nitroGLYCERIN (NITROSTAT) 0.4 MG SL tablet Place 1 tablet (0.4 mg total) under the tongue every 5 (five) minutes as needed for chest pain. 25 tablet 0   nystatin (MYCOSTATIN/NYSTOP) powder Apply 1 g topically 4 (four) times daily as needed (rash).     ondansetron (ZOFRAN-ODT) 4 MG disintegrating tablet Take 1 tablet (4 mg total) by mouth every 8 (eight) hours as needed for nausea or vomiting. 30 tablet 0   OneTouch Delica Lancets 99991111 MISC Used to check blood sugar two times a day. 100 each 4   OVER THE COUNTER MEDICATION Apply 1 application topically daily as needed (pain). [place] Gel topical pain reliever     pantoprazole (PROTONIX) 40 MG tablet Take 1 tablet (40 mg total) by mouth daily. 90 tablet 2   Polyethyl Glycol-Propyl Glycol (SYSTANE OP) Place 1 drop into both eyes 4 (four) times daily as needed (dry eyes).     pregabalin (LYRICA) 50 MG capsule TAKE 1  CAPSULE BY MOUTH TWICE A DAY 60 capsule 2   No current facility-administered medications on file prior to visit.     ROS see history of present illness  Objective  Physical Exam Vitals:   09/21/22 1442  BP: 110/70  Pulse: (!) 108  Temp: 98.6 F (37 C)  SpO2: 97%    BP Readings from Last 3 Encounters:  09/21/22 110/70  08/15/22 (!) 142/73  08/06/22 (!) 150/73   Wt Readings from Last 3 Encounters:  09/21/22 246 lb 12.8 oz (111.9 kg)  08/30/22 248 lb (112.5 kg)  08/15/22 248 lb (112.5 kg)    Physical Exam Constitutional:      General: She is not in acute distress.    Appearance: Normal appearance.  HENT:  Head: Normocephalic.  Cardiovascular:     Rate and Rhythm: Normal rate and regular rhythm.     Heart sounds: Normal heart sounds.  Pulmonary:     Effort: Pulmonary effort is normal.     Breath sounds: Normal breath sounds.  Skin:    General: Skin is warm and dry.  Neurological:     General: No focal deficit present.     Mental Status: She is alert.  Psychiatric:        Mood and Affect: Mood normal.        Behavior: Behavior normal.    Assessment/Plan: Please see individual problem list.  Hypoglycemia due to insulin Assessment & Plan: Episodes likely due to inappropriate use of Novolog. Educated patient on proper insulin use. Counseled on checking blood sugar prior to eating and 1-2 hours postprandial. Gvoke pen sent in for patient and husband to have on hand for emergencies or severe hypoglycemia. Education provided on use. Discussed keeping glucose tablets/snacks nearby if traveling or out shopping for mild hypoglycemic events. Encouraged patient to stay adequately hydrated. Will refer to Pharmacy and Endocrinology for additional medication management assistance.   Orders: -     Gvoke HypoPen 2-Pack; Inject 1 auto injector subcutaneously as needed for severe hypoglycemia. May repeat x 1 after 15 minutes if needed.  Dispense: 0.1 mL; Refill: 2 -      Ambulatory referral to Endocrinology -     AMB Referral to Chronic Care Management Services  Type II diabetes mellitus with neurological manifestations ([place]) Assessment & Plan: A1c- 7.1 Recent increased hypoglycemic events. Encouraged patient to monitor blood sugars closely and counseled on Novolog use. Discussed healthy diet and adequate hydration. Continue [place] as prescribed. Refer to Endocrine and Pharmacy.   Orders: -     Gvoke HypoPen 2-Pack; Inject 1 auto injector subcutaneously as needed for severe hypoglycemia. May repeat x 1 after 15 minutes if needed.  Dispense: 0.1 mL; Refill: 2 -     Ambulatory referral to Endocrinology -     AMB Referral to Chronic Care Management Services   Return if symptoms worsen or fail to improve.   Denise Morrow, NP-C [place]

## 2022-09-21 NOTE — Patient Instructions (Signed)
It was nice to meet you.   Please make sure you are drinking plenty of water.   Keep a close eye on your blood sugars.   Watch closely how much insulin you are giving yourself.

## 2022-09-24 ENCOUNTER — Telehealth: Payer: Self-pay | Admitting: Internal Medicine

## 2022-09-24 DIAGNOSIS — E160 Drug-induced hypoglycemia without coma: Secondary | ICD-10-CM | POA: Insufficient documentation

## 2022-09-24 NOTE — Assessment & Plan Note (Signed)
A1c- 7.1 Recent increased hypoglycemic events. Encouraged patient to monitor blood sugars closely and counseled on Novolog use. Discussed healthy diet and adequate hydration. Continue [place] as prescribed. Refer to Endocrine and Pharmacy.

## 2022-09-24 NOTE — Assessment & Plan Note (Addendum)
Episodes likely due to inappropriate use of Novolog. Educated patient on proper insulin use. Counseled on checking blood sugar prior to eating and 1-2 hours postprandial. Gvoke pen sent in for patient and husband to have on hand for emergencies or severe hypoglycemia. Education provided on use. Discussed keeping glucose tablets/snacks nearby if traveling or out shopping for mild hypoglycemic events. Encouraged patient to stay adequately hydrated. Will refer to Pharmacy and Endocrinology for additional medication management assistance.

## 2022-09-24 NOTE — Telephone Encounter (Signed)
Lft pt vm to call ofc regarding referral. thanks 

## 2022-09-25 ENCOUNTER — Telehealth: Payer: Self-pay | Admitting: Internal Medicine

## 2022-09-25 DIAGNOSIS — E1149 Type 2 diabetes mellitus with other diabetic neurological complication: Secondary | ICD-10-CM

## 2022-09-25 DIAGNOSIS — E160 Drug-induced hypoglycemia without coma: Secondary | ICD-10-CM

## 2022-09-25 MED ORDER — GVOKE HYPOPEN 2-PACK 0.5 MG/0.1ML ~~LOC~~ SOAJ: SUBCUTANEOUS | 2 refills | Status: DC

## 2022-09-25 NOTE — Addendum Note (Signed)
Addended by: Gracy Racer on: 09/25/2022 03:51 PM   Modules accepted: Orders

## 2022-09-25 NOTE — Telephone Encounter (Signed)
I spoke with pt she states the medication of Glucagon (GVOKE HYPOPEN 2-PACK) 0.5 MG/0.1ML SOAJ should be sent to  [facility] [place], [place] - [address] Phone: [phone]  Fax: [phone]     Please advise and Thank you!

## 2022-09-25 NOTE — Telephone Encounter (Signed)
Sent the med to the correct pharmacy

## 2022-09-27 ENCOUNTER — Encounter: Payer: Self-pay | Admitting: Internal Medicine

## 2022-09-27 ENCOUNTER — Ambulatory Visit: Payer: Medicare Other | Attending: Internal Medicine | Admitting: Internal Medicine

## 2022-09-27 ENCOUNTER — Telehealth: Payer: Self-pay | Admitting: Internal Medicine

## 2022-09-27 ENCOUNTER — Other Ambulatory Visit
Admission: RE | Admit: 2022-09-27 | Discharge: 2022-09-27 | Disposition: A | Discharge status: Home / Self Care | Payer: Medicare Other | Source: Ambulatory Visit | Attending: Internal Medicine | Admitting: Internal Medicine

## 2022-09-27 ENCOUNTER — Telehealth: Payer: Self-pay

## 2022-09-27 VITALS — BP 122/62 | HR 115 | Ht 64.5 in | Wt 244.0 lb

## 2022-09-27 DIAGNOSIS — E785 Hyperlipidemia, unspecified: Secondary | ICD-10-CM | POA: Insufficient documentation

## 2022-09-27 DIAGNOSIS — E1169 Type 2 diabetes mellitus with other specified complication: Secondary | ICD-10-CM | POA: Insufficient documentation

## 2022-09-27 DIAGNOSIS — I25118 Atherosclerotic heart disease of native coronary artery with other forms of angina pectoris: Secondary | ICD-10-CM | POA: Insufficient documentation

## 2022-09-27 DIAGNOSIS — I4819 Other persistent atrial fibrillation: Secondary | ICD-10-CM | POA: Insufficient documentation

## 2022-09-27 DIAGNOSIS — R55 Syncope and collapse: Secondary | ICD-10-CM | POA: Insufficient documentation

## 2022-09-27 DIAGNOSIS — I4891 Unspecified atrial fibrillation: Secondary | ICD-10-CM | POA: Diagnosis not present

## 2022-09-27 DIAGNOSIS — I5032 Chronic diastolic (congestive) heart failure: Secondary | ICD-10-CM | POA: Diagnosis not present

## 2022-09-27 LAB — CBC
HCT: 42.6 % (ref 36.0–46.0)
Hemoglobin: 13.6 g/dL (ref 12.0–15.0)
MCH: 29.4 pg (ref 26.0–34.0)
MCHC: 31.9 g/dL (ref 30.0–36.0)
MCV: 92.2 fL (ref 80.0–100.0)
Platelets: 301 10*3/uL (ref 150–400)
RBC: 4.62 MIL/uL (ref 3.87–5.11)
RDW: 14.7 % (ref 11.5–15.5)
WBC: 13.6 10*3/uL — ABNORMAL HIGH (ref 4.0–10.5)
nRBC: 0 % (ref 0.0–0.2)

## 2022-09-27 LAB — COMPREHENSIVE METABOLIC PANEL
ALT: 14 U/L (ref 0–44)
AST: 20 U/L (ref 15–41)
Albumin: 3.2 g/dL — ABNORMAL LOW (ref 3.5–5.0)
Alkaline Phosphatase: 122 U/L (ref 38–126)
Anion gap: 9 (ref 5–15)
BUN: 17 mg/dL (ref 8–23)
CO2: 26 mmol/L (ref 22–32)
Calcium: 8.4 mg/dL — ABNORMAL LOW (ref 8.9–10.3)
Chloride: 106 mmol/L (ref 98–111)
Creatinine, Ser: 1.1 mg/dL — ABNORMAL HIGH (ref 0.44–1.00)
GFR, Estimated: 52 mL/min — ABNORMAL LOW (ref >60)
Glucose, Bld: 109 mg/dL — ABNORMAL HIGH (ref 70–99)
Potassium: 4 mmol/L (ref 3.5–5.1)
Sodium: 141 mmol/L (ref 135–145)
Total Bilirubin: 0.5 mg/dL (ref 0.3–1.2)
Total Protein: 7 g/dL (ref 6.5–8.1)

## 2022-09-27 LAB — TSH: TSH: 2.887 u[IU]/mL (ref 0.350–4.500)

## 2022-09-27 LAB — MAGNESIUM: Magnesium: 2 mg/dL (ref 1.7–2.4)

## 2022-09-27 MED ORDER — APIXABAN 5 MG PO TABS: 5.0000 mg | ORAL_TABLET | Freq: Two times a day (BID) | ORAL | 3 refills | Status: DC

## 2022-09-27 MED ORDER — APIXABAN 5 MG PO TABS: 5.0000 mg | ORAL_TABLET | Freq: Two times a day (BID) | ORAL | 0 refills | Status: DC

## 2022-09-27 MED ORDER — METOPROLOL SUCCINATE ER 25 MG PO TB24: 25.0000 mg | ORAL_TABLET | Freq: Two times a day (BID) | ORAL | 3 refills | Status: DC

## 2022-09-27 NOTE — Telephone Encounter (Signed)
[employer] executive) called stating pt would like to be called regarding her sugar because her sugar is 40 points off

## 2022-09-27 NOTE — Telephone Encounter (Signed)
LMTCB

## 2022-09-27 NOTE — Progress Notes (Signed)
Follow-up Outpatient Visit Date: 09/27/2022  Primary Care Provider: [name], MD [place] [address]  Chief Complaint: Fatigue, shortness of breath, and syncope  HPI:  Denise Sharp is a 77 y.o. female with history of CAD status post PCI to the ostial and mid LAD in the setting of unstable angina in 12/2019, chronic HFpEF, hypertension, type 2 diabetes mellitus, iron deficiency anemia following gastric bypass x2, irritable bowel syndrome, esophageal stenosis, chronic dizziness with gait instability, cervicogenic headache with cervical spine stenosis, obstructive sleep apnea on CPAP, restless leg syndrome, and adrenal mass, who presents for follow-up of disease and HFpEF.  I last saw her in December, at which time she was feeling a little bit better than at prior visits.  Preceding catheterization in late November showed nonobstructive CAD with patent LAD stents.  We agreed to discontinue potassium supplement and ranolazine due to concerns that polypharmacy may be contributing to her symptoms.  Today, Denise Sharp reports that she has been feeling worse for the last few months.  She has noticed a progressive decline in her energy.  She had an episode of near syncope last Wednesday while shopping.  It was precipitated by hypoglycemia.  She has noticed some palpitations as well as intermittent chest pain radiating to the left arm.  It is not related to certain activities.  She also feels out of breath with minimal activity.  Lower extremity edema comes and goes.  She notes that her heart rate has been elevated the last couple of weeks, including at recent visit at her PCPs office.  At times, she also feels like her body trembles.  She has been evaluated by neurology for possible seizures.  Denise Sharp had COVID-19 last month and was not treated with antiviral therapy as she was diagnosed too late in the course of her  illness.  --------------------------------------------------------------------------------------------------  Past Medical History:  Diagnosis Date   (HFpEF) heart failure with preserved ejection fraction ([place])    a. 12/2019 Echo: EF 65-70%, Gr2 DD. No significant valvular dzs.   Abnormality of gait 03/25/2013   Adrenal mass, left (HCC)    Anginal pain (HCC)    Anxiety    Aortic atherosclerosis (HCC)    Arthritis    Asthma    CAD (coronary artery disease) with h/o Atypical Chest Pain    a. 04/1986 Cath ([facility]): nl cors, EF 65%; b. 07/2012 Cath [facility]): Diff minor irregs; c. 07/2016 MV [facility]): "Equivocal"; d. 08/2016 Cardiac CT Ca2+ score [facility]): Ca2+ [id number]; e. 05/2019 MV: No ischemia. EF 75%; f. 12/2019 PCI: LM nl, LAD 65ost (3.5x12 Resolute DES), 6m(2.75x12 Resolute DES),LCX/RCA nl; g. 08/2020 Cath: patent LAD stents, RCA 40ost, elev RH pressures, EF >65%.   Cervical spinal stenosis 1994   due to trauma to back ([employer] accident), has intermittent paralysis and parasthesias   Cervicogenic headache 03/23/2014   CKD (chronic kidney disease), stage III (HCC)    Depression    Diverticulosis    Dizziness    a.) chronic   DJD (degenerative joint disease)    a. Chronic R shoulder pain   Dyspnea    Esophageal stenosis 09/2009   a.) transient outlet obstruction by food, cleared by EGD   Family history of adverse reaction to anesthesia    a.) daughter with (+) PONV   Gastric bypass status for obesity    GERD (gastroesophageal reflux disease)    Headache(784.0)    HLD (hyperlipidemia)    Hypertension  IBS (irritable bowel syndrome)    IDA (iron deficiency anemia)    a.) post 2 unit txfsn 2009, normal endo/colonoscopy by [facility]   Left bundle branch block (LBBB)    a.) Intermittently present - likely rate related.   NSVT (nonsustained ventricular tachycardia) ([place]) 11/15/2020   a.) Holter study 11/15/2020; 11 episodes of NSVT lasting up to 8 beats with a maximum rate of 210 bpm; 14 atrial runs  lasting up to 18 beats with a rate of up to 250 bpm.  Some atrial runs felt to be NSVT.   NSVT (nonsustained ventricular tachycardia) ([place])    a. 10/2020 Zio: 11 runs of NSVT up to 8 beats.   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 10/2020 Zio: 14 atrial runs up to 18 beats, max HR 250.   Restless leg syndrome    Rotator cuff arthropathy, right 08/13/2017   Syncope and collapse 03/12/2014   Type II diabetes mellitus ([place])    Past Surgical History:  Procedure Laterality Date   APPENDECTOMY     BACK SURGERY     CHOLECYSTECTOMY N/A 1976   COLONOSCOPY     CORONARY ANGIOPLASTY     CORONARY STENT INTERVENTION N/A 01/04/2020   Procedure: CORONARY STENT INTERVENTION;  Surgeon: [name], MD;  Location: [facility];  Service: Cardiovascular;  Laterality: N/A;  LAD    DIAPHRAGMATIC HERNIA REPAIR  2015   ESOPHAGEAL DILATION     multiple   ESOPHAGOGASTRODUODENOSCOPY (EGD) WITH PROPOFOL N/A 05/11/2021   Procedure: ESOPHAGOGASTRODUODENOSCOPY (EGD) WITH PROPOFOL;  Surgeon: [name], MD;  Location: [facility];  Service: Endoscopy;  Laterality: N/A;   ESOPHAGOGASTRODUODENOSCOPY (EGD) WITH PROPOFOL N/A 06/13/2021   Procedure: ESOPHAGOGASTRODUODENOSCOPY (EGD) WITH PROPOFOL;  Surgeon: [name], MD;  Location: [facility];  Service: Endoscopy;  Laterality: N/A;   EYE SURGERY     GASTRIC BYPASS  2000, 2005   Dr. [name] IMPLANT PLACEMENT  10/2011   Hollice Gong IMPLANT PLACEMENT N/A 09/18/2021   Procedure: Barrie Lyme IMPLANT FIRST STAGE;  Surgeon: Bjorn Loser, MD;  Location: [facility];  Service: Urology;  Laterality: N/A;   INTERSTIM IMPLANT PLACEMENT N/A 09/18/2021   Procedure: Barrie Lyme IMPLANT SECOND STAGE WITH IMPEDENCE CHECK;  Surgeon: Bjorn Loser, MD;  Location: [facility];  Service: Urology;   Laterality: N/A;   INTERSTIM IMPLANT REMOVAL N/A 09/18/2021   Procedure: REMOVAL OF INTERSTIM IMPLANT;  Surgeon: Bjorn Loser, MD;  Location: [facility];  Service: Urology;  Laterality: N/A;   LEFT HEART CATH AND CORONARY ANGIOGRAPHY N/A 04/1986   LEFT HEART CATH AND CORONARY ANGIOGRAPHY Left 07/24/2012   LEFT HEART CATH AND CORS/GRAFTS ANGIOGRAPHY N/A 12/31/2019   Procedure: LEFT HEART CATH AND CORS/GRAFTS ANGIOGRAPHY;  Surgeon: [name], MD;  Location: [facility];  Service: Cardiovascular;  Laterality: N/A;   PANNICULECTOMY N/A 06/16/2019   Procedure: PANNICULECTOMY;  Surgeon: [name], MD;  Location: [facility];  Service: Plastics;  Laterality: N/A;  3 hours, please   REVERSE SHOULDER ARTHROPLASTY Right 08/13/2017   Procedure: REVERSE RIGHT SHOULDER ARTHROPLASTY;  Surgeon: [name], MD;  Location: [facility];  Service: Orthopedics;  Laterality: Right;   RIGHT/LEFT HEART CATH AND CORONARY ANGIOGRAPHY N/A 09/06/2020   Procedure: RIGHT/LEFT HEART CATH AND CORONARY ANGIOGRAPHY;  Surgeon: [name], MD;  Location: [facility];  Service: Cardiovascular;  Laterality: N/A;   RIGHT/LEFT HEART CATH AND CORONARY ANGIOGRAPHY N/A 06/04/2022   Procedure: RIGHT/LEFT HEART CATH AND CORONARY ANGIOGRAPHY;  Surgeon: [name], MD;  Location: [facility];  Service: Cardiovascular;  Laterality: N/A;   ROTATOR CUFF REPAIR Right    [place] EXTRACTION  11/13/2021   TOTAL ABDOMINAL HYSTERECTOMY W/ BILATERAL SALPINGOOPHORECTOMY  1974   TOTAL KNEE ARTHROPLASTY Bilateral 2007   Procedure: TOTAL KNEE ARTHROPLASTY; Surgeons: Mauri Pole, MD and [name], MD   UMBILICAL HERNIA REPAIR  08/11/2015    Current Meds  Medication Sig   albuterol (VENTOLIN HFA) 108 (90 Base) MCG/ACT inhaler Inhale 1-2 puffs into the lungs every 6 (six) hours as needed for wheezing or shortness of breath.   aspirin EC 81 MG tablet Take 81 mg by mouth daily. Swallow  whole.   atorvastatin (LIPITOR) 80 MG tablet Take 1 tablet (80 mg total) by mouth daily.   benzonatate (TESSALON) 200 MG capsule Take 1 capsule (200 mg total) by mouth 2 (two) times daily as needed for cough.   butalbital-acetaminophen-caffeine (FIORICET) 50-325-40 MG tablet TAKE 1 TABLET BY MOUTH EVERY 6 HOURS AS NEEDED FOR HEADACHE.   Cholecalciferol (VITAMIN D3) 250 MCG (10000 UT) capsule Take 10,000 Units by mouth daily.   clotrimazole-betamethasone (LOTRISONE) cream Apply 1 application topically 2 (two) times daily as needed (irritation).   conjugated estrogens (PREMARIN) vaginal cream Apply one pea-sized amount around the opening of the urethra daily for 2 weeks, then 3 times weekly moving forward. (Patient taking differently: Apply one pea-sized amount around the opening of the urethra  3 times weekly)   cyanocobalamin (,VITAMIN B-12,) 1000 MCG/ML injection Inject 1 mL (1,000 mcg total) into the muscle every 30 (thirty) days. Inject 1 ml (1000 mcg ) IM weekly x 4,  Then monthly thereafter   dapagliflozin propanediol (FARXIGA) 10 MG TABS tablet Take 1 tablet (10 mg total) by mouth daily.   dicyclomine (BENTYL) 20 MG tablet Take 1 tablet (20 mg total) by mouth 3 (three) times daily before meals.   DULoxetine (CYMBALTA) 30 MG capsule Take 1 capsule (30 mg total) by mouth daily.   fexofenadine (ALLEGRA) 180 MG tablet Take 180 mg by mouth as needed for allergies or rhinitis.   fluticasone (FLONASE) 50 MCG/ACT nasal spray Place 2 sprays into both nostrils daily. (Patient taking differently: Place 2 sprays into both nostrils 3 (three) times daily as needed for allergies.)   Glucagon (GVOKE HYPOPEN 2-PACK) 0.5 MG/0.1ML SOAJ Inject 1 auto injector subcutaneously as needed for severe hypoglycemia. May repeat x 1 after 15 minutes if needed.   insulin aspart (NOVOLOG FLEXPEN) 100 UNIT/ML FlexPen Inject 4-16 units with meals per sliding scale: If pre-meal glucose <100, inject 4 units; 100-150 - inject 6  units, 151-200 - inject 8 units; 201-250 - inject 10 units; 251-300 - inject 12 units, 301-350 - inject 14 units ; 350 > inject 16 units (Patient taking differently: Inject 4-14 Units into the skin 3 (three) times daily.)   insulin degludec (TRESIBA FLEXTOUCH) 100 UNIT/ML FlexTouch Pen Inject 20 Units into the skin daily.   loperamide (IMODIUM) 2 MG capsule Take 4-6 mg by mouth 3 (three) times daily before meals.   magnesium gluconate (MAGONATE) 500 MG tablet Take 500 mg by mouth daily.   metoprolol succinate (TOPROL XL) 25 MG 24 hr tablet Take 1 tablet (25 mg total) by mouth daily.   nitroGLYCERIN (NITROSTAT) 0.4 MG SL  tablet Place 1 tablet (0.4 mg total) under the tongue every 5 (five) minutes as needed for chest pain.   nystatin (MYCOSTATIN/NYSTOP) powder Apply 1 g topically 4 (four) times daily as needed (rash).   ondansetron (ZOFRAN-ODT) 4 MG disintegrating tablet Take 1 tablet (4 mg total) by mouth every 8 (eight) hours as needed for nausea or vomiting.   pantoprazole (PROTONIX) 40 MG tablet Take 1 tablet (40 mg total) by mouth daily.   Polyethyl Glycol-Propyl Glycol (SYSTANE OP) Place 1 drop into both eyes 4 (four) times daily as needed (dry eyes).   pregabalin (LYRICA) 50 MG capsule TAKE 1 CAPSULE BY MOUTH TWICE A DAY    Allergies: Demeclocycline, Erythromycin, Flagyl [metronidazole], Glucophage [metformin hcl], Tetracyclines & related, Furosemide, Tetracycline, Diovan [valsartan], Sulfa antibiotics, and Xanax [alprazolam]  Social History   Tobacco Use   Smoking status: Never    Passive exposure: Never   Smokeless tobacco: Never  Vaping Use   Vaping Use: Never used  Substance Use Topics   Alcohol use: No   Drug use: Not Currently    Comment: prescribed valium    Family History  Problem Relation Age of Onset   Heart disease Father    Hypertension Father    Prostate cancer Father    Stroke Father    Osteoporosis Father    Stroke Mother    Depression Mother    Headache  Mother    Heart disease Mother    Thyroid disease Mother    Hypertension Mother    Diabetes Daughter    Heart disease Daughter    Hypertension Daughter    Hypertension Son     Review of Systems: A 12-system review of systems was performed and was negative except as noted in the HPI.  --------------------------------------------------------------------------------------------------  Physical Exam: BP 122/62 (BP Location: Left Arm, Patient Position: Sitting, Cuff Size: Normal)   Pulse (!) 115   Ht 5' 4.5" (1.638 m)   Wt 244 lb (110.7 kg)   SpO2 96%   BMI 41.24 kg/m   General:  NAD. Neck: No JVD or HJR. Lungs: Clear to auscultation bilaterally without wheezes or crackles. Heart: Tachycardic and irregularly irregular rhythm without murmurs, rubs, or gallops. Abdomen: Soft, nontender, nondistended. Extremities: No lower extremity edema.  EKG: Atrial fibrillation with rapid ventricular response (heart rate 115 bpm) with PVCs versus aberrancy and nonspecific ST/T changes.  Atrial fibrillation is new since 06/21/2022.  Lab Results  Component Value Date   WBC 10.3 07/25/2022   HGB 13.3 07/25/2022   HCT 41.5 07/25/2022   MCV 93.0 07/25/2022   PLT 298 07/25/2022    Lab Results  Component Value Date   NA 139 07/25/2022   K 4.3 07/25/2022   CL 106 07/25/2022   CO2 24 07/25/2022   BUN 15 07/25/2022   CREATININE 1.09 (H) 07/25/2022   GLUCOSE 164 (H) 07/25/2022   ALT 15 07/25/2022    Lab Results  Component Value Date   CHOL 201 (H) 07/13/2022   HDL 50 07/13/2022   LDLCALC 114 (H) 07/13/2022   LDLDIRECT 126 (H) 07/13/2022   TRIG 246 (H) 07/13/2022   CHOLHDL 4.0 07/13/2022    --------------------------------------------------------------------------------------------------  ASSESSMENT AND PLAN: New-onset atrial fibrillation: Denise Sharp is in atrial fibrillation today, which is a new finding for her.  I suspect that some of her progressive fatigue, exertional  dyspnea, and vague chest discomfort could be related to this.  Her ventricular rates are mildly elevated today.  We have agreed to increase  metoprolol succinate to 25 mg twice daily.  In the setting of a CHA2DS2-VASc score of at least 6, anticoagulation is indicated.  We will therefore start apixaban 5 mg twice daily and discontinue aspirin.  I will also check a CBC, CMP, magnesium level, and TSH today.  We will have her follow-up with [place] in 2 to 3 weeks.  If she remains in atrial fibrillation at that time, we will need to arrange for cardioversion once she has completed 4 weeks of therapeutic anticoagulation.  Coronary artery disease with stable angina: Denise Sharp reports some atypical chest pain has been an ongoing issue for her.  Prior PCI's may have helped her chest pain, though it is difficult to discern.  I suspect that her symptoms may be exacerbated by new onset atrial fibrillation.  As above, we will increase metoprolol succinate for improved heart rate control as well as antianginal therapy.  We will start apixaban 5 mg twice daily and lieu of aspirin in the setting of atrial fibrillation.  Continue high intensity statin therapy.  Chronic HFpEF: Denise Sharp appears fairly euvolemic on exam today.  Exercise intolerance is likely exacerbated by atrial fibrillation.  As above, will increase metoprolol.  No other medication changes at this time.  Hyperlipidemia associated with type 2 diabetes mellitus: LDL suboptimally controlled on last check in 06/2022.  Continue atorvastatin 80 mg daily for now.  Readdress addition of ezetimibe versus transition to rosuvastatin at follow-up for target LDL less than 70.  Syncope: Recurrent syncopal episodes seem to happen most frequently when Denise Sharp has hypoglycemic episodes.  Given new finding of atrial fibrillation, posttermination pauses would also be a consideration.  However, the patient has already worn a 30-day event monitor which did not show  any significant arrhythmias.  If syncope continues, particularly in the absence of hypoglycemia, [place] may need to see EP for consideration of ILR placement.  Morbid obesity: BMI remains greater than 40 with multiple comorbidities.  Weight loss again encouraged, though activity is currently limited by exercise intolerance in the setting of aforementioned cardiac issues.  Follow-up: Return to clinic in 2 to 3 weeks.  [name], MD 09/27/2022 4:27 PM

## 2022-09-27 NOTE — Progress Notes (Signed)
   Care Guide Note  09/27/2022 Name: CHLOEY RICARD MRN: [id number] DOB: 03-Feb-1946  Referred by: [name], MD Reason for referral : Care Coordination (Outreach to schedule with Pharm d )   YOLANDRA HABIG is a 77 y.o. year old female who is a primary care patient of [name], Aris Everts, MD. JASILYN HOLDERMAN was referred to the pharmacist for assistance related to DM.    Successful contact was made with the patient to discuss pharmacy services including being ready for the pharmacist to call at least 5 minutes before the scheduled appointment time, to have medication bottles and any blood sugar or blood pressure readings ready for review. The patient agreed to meet with the pharmacist via with the pharmacist via telephone visit on (date/time).  10/15/2022  Noreene Larsson, [place], [place] [address] Direct Dial: [phone] [email or web address]

## 2022-09-27 NOTE — Patient Instructions (Addendum)
Medication Instructions:  Your physician recommends the following medication changes.  STOP TAKING: Aspirin 81 mg daily  START TAKING: Eliquis 5 mg by mouth twice a day (Samples provided today)  INCREASE: Metoprolol Succinate to 25 mg by mouth twice a day  *If you need a refill on your cardiac medications before your next appointment, please call your pharmacy*   Lab Work: Your provider would like for you to have following labs drawn: (CBC, CMP, Mg, TSH).   Please go to the [facility] entrance and check in at the front desk.  You do not need an appointment.  They are open from 7am-6 pm.     Testing/Procedures: None ordered today   Follow-Up: At [facility], you and your health needs are our priority.  As part of our continuing mission to provide you with exceptional heart care, we have created designated Provider Care Teams.  These Care Teams include your primary Cardiologist (physician) and Advanced Practice Providers (APPs -  Physician Assistants and Nurse Practitioners) who all work together to provide you with the care you need, when you need it.  We recommend signing up for the patient portal called "MyChart".  Sign up information is provided on this After Visit Summary.  MyChart is used to connect with patients for Virtual Visits (Telemedicine).  Patients are able to view lab/test results, encounter notes, upcoming appointments, etc.  Non-urgent messages can be sent to your provider as well.   To learn more about what you can do with MyChart, go to [email or web address].    Your next appointment:   2-3 week(s)  Provider:   You may see [name], MD or one of the following Advanced Practice Providers on your designated Care Team:   Murray Hodgkins, NP Christell Faith, PA-C Cadence Kathlen Mody, PA-C Gerrie Nordmann, NP

## 2022-09-28 ENCOUNTER — Telehealth: Payer: Self-pay | Admitting: Internal Medicine

## 2022-09-28 MED ORDER — METOPROLOL SUCCINATE ER 25 MG PO TB24: 25.0000 mg | ORAL_TABLET | Freq: Two times a day (BID) | ORAL | 0 refills | Status: DC

## 2022-09-28 NOTE — Telephone Encounter (Signed)
*  STAT* If patient is at the pharmacy, call can be transferred to refill team.   1. Which medications need to be refilled? (please list name of each medication and dose if known) metoprolol succinate (TOPROL XL) 25 MG 24 hr tablet Take 1 tablet (25 mg total) by mouth 2 (two) times daily.   2. Which pharmacy/location (including street and city if local pharmacy) is medication to be sent to? [id number] [name], [place]    3. Do they need a 30 day or 90 day supply? 30 Day

## 2022-09-28 NOTE — Telephone Encounter (Signed)
Patient returned Jessica's phone call. 

## 2022-10-01 DIAGNOSIS — Z0279 Encounter for issue of other medical certificate: Secondary | ICD-10-CM

## 2022-10-02 NOTE — Telephone Encounter (Signed)
Spoke with and she stated that she saw Tomasita Morrow, NP about a week ago and she was supposed to call back and let her know that her Elenor Legato sensor is off about 40 points from an actual finger stick. Pt is scheduled to talk to Catie on 10/15/2022.   Pt also wanted to let [name] know that her A fib has gotten worse and when she followed up with cardiology they doubled her heart medication and put her on Eliquis. They advised her try this for 2 weeks and if her heart does not stay below 100 bpm then they will try shocking her heart back into rhythm.

## 2022-10-03 NOTE — Telephone Encounter (Signed)
LMOM to CB. 

## 2022-10-03 NOTE — Telephone Encounter (Signed)
Pt called back and I read the message to her and she want the provider to order the dexcom and there are three insurances that she want the provider to run

## 2022-10-04 ENCOUNTER — Encounter: Payer: Self-pay | Admitting: Internal Medicine

## 2022-10-04 ENCOUNTER — Other Ambulatory Visit: Payer: Self-pay | Admitting: Nurse Practitioner

## 2022-10-04 DIAGNOSIS — E1149 Type 2 diabetes mellitus with other diabetic neurological complication: Secondary | ICD-10-CM

## 2022-10-04 MED ORDER — DEXCOM G7 SENSOR MISC: 11 refills | Status: DC

## 2022-10-11 ENCOUNTER — Encounter: Payer: Self-pay | Admitting: Podiatry

## 2022-10-11 ENCOUNTER — Ambulatory Visit (INDEPENDENT_AMBULATORY_CARE_PROVIDER_SITE_OTHER): Payer: Medicare Other | Admitting: Podiatry

## 2022-10-11 DIAGNOSIS — E1149 Type 2 diabetes mellitus with other diabetic neurological complication: Secondary | ICD-10-CM | POA: Diagnosis not present

## 2022-10-11 DIAGNOSIS — M79676 Pain in unspecified toe(s): Secondary | ICD-10-CM

## 2022-10-11 DIAGNOSIS — B351 Tinea unguium: Secondary | ICD-10-CM | POA: Diagnosis not present

## 2022-10-11 NOTE — Progress Notes (Signed)
This patient returns to my office for at risk foot care.  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  Patient is taking plavix..  This patient is unable to cut nails herself since the patient cannot reach her nails.These nails are painful walking and wearing shoes.  This patient presents for at risk foot care today. ? ?General Appearance  Alert, conversant and in no acute stress. ? ?Vascular  Dorsalis pedis and posterior tibial  pulses are palpable  bilaterally.  Capillary return is within normal limits  bilaterally. Temperature is within normal limits  bilaterally. ? ?Neurologic  Senn-Weinstein monofilament wire test absent   bilaterally. Muscle power within normal limits bilaterally. ? ?Nails Thick disfigured discolored nails with subungual debris  from hallux to fifth toes bilaterally. No evidence of bacterial infection or drainage bilaterally. ? ?Orthopedic  No limitations of motion  feet .  No crepitus or effusions noted.  No bony pathology or digital deformities noted. Charcot foot.  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 ? ?Skin  normotropic skin with no porokeratosis noted bilaterally.  No signs of infections or ulcers noted.  Laceration second toe right foot.  No bleeding or redness noted.  ? ?Onychomycosis  Pain in right toes  Pain in left toes ? ?Consent was obtained for treatment procedures.   Mechanical debridement of nails 1-5  bilaterally performed with a nail nipper.  Filed with dremel without incident.  ? ? ?Return office visit    3 months                  Told patient to return for periodic foot care and evaluation due to potential at risk complications. ? ? ?Amaka Gluth DPM  ?

## 2022-10-15 ENCOUNTER — Other Ambulatory Visit: Payer: Medicare Other | Admitting: Pharmacist

## 2022-10-15 DIAGNOSIS — E1142 Type 2 diabetes mellitus with diabetic polyneuropathy: Secondary | ICD-10-CM

## 2022-10-15 MED ORDER — TRESIBA FLEXTOUCH 100 UNIT/ML ~~LOC~~ SOPN: 12.0000 [IU] | PEN_INJECTOR | Freq: Every day | SUBCUTANEOUS | 3 refills | Status: DC

## 2022-10-15 MED ORDER — ATORVASTATIN CALCIUM 80 MG PO TABS: 80.0000 mg | ORAL_TABLET | Freq: Every day | ORAL | 1 refills | Status: DC

## 2022-10-15 MED ORDER — FIASP FLEXTOUCH 100 UNIT/ML ~~LOC~~ SOPN: 4.0000 [IU] | PEN_INJECTOR | Freq: Three times a day (TID) | SUBCUTANEOUS | 11 refills | Status: DC

## 2022-10-15 NOTE — Patient Instructions (Addendum)
Iridiana,   1) Increase Tresiba to 12 units daily. Do not adjust this medication based on blood sugars  2) Let's try Fiasp, a more-rapid-acting version of Novolog. Continue your current sliding scale.   The [place] measures the glucose level in your skin tissue, while a fingerstick measures the glucose level in your blood. If you blood sugar is changing, these two numbers will be different. Here is the explanation on the [place] website: [email or web address][email or web address]  Let me know if you have any questions or concerns.   Thanks!  Catie Hedwig Morton, PharmD, [place], [facility] [phone]

## 2022-10-15 NOTE — Progress Notes (Signed)
10/15/2022 Name: Denise Sharp MRN: [id number] DOB: 11-26-1945  Chief Complaint  Patient presents with   Medication Management   Hypertension   Diabetes   Hyperlipidemia    Denise Sharp is a 77 y.o. year old female who presented for a telephone visit.   They were referred to the pharmacist by their PCP for assistance in managing complex medication management.    Subjective:  Care Team: Primary Care Provider: [name], MD ; Next Scheduled Visit: not scheduled  Medication Access/Adherence  Current Pharmacy:  [id number] [name], [place] - [address] [place] [address] [place] [address] Phone: [phone] Fax: [phone]  [facility] [place], [place] [address] [facility] [address] [place] [address] Phone: [phone] Fax: [phone]   Patient reports affordability concerns with their medications: No  Patient reports access/transportation concerns to their pharmacy: No  Patient reports adherence concerns with their medications:  No     Diabetes:  Current medications: Farxiga 10 mg daily, Tresiba 10-15 units daily - reports that she has been doing 10 or 15 units based on if her pre-bedtime readings are below or above 200, Novolog 4-16 units daily per sliding scale Previous medications: metformin (GI upset, refuses to retry); avoid pioglitazone due to HF; GI upset with Ozempic and Trulicity  Novolog:  - [id number]: 4 units - 100-150: 6 units - 151-200: 8 units - 201-250: 10 units - 251 - 300: 12 units - 300 - 350: 14 units  Date of Download: past 7 days Average Glucose: 201 mg/dL - 205/159/139/173 Time in Goal:  - Time in range 70-180: 51% - Time above range: 47% - Time below range: 3%  Concern over accuracy of Libre. Notes that Libre readings and finger sticks are not always the same. Reports she takes Novolog ~ 5-10 minutes before meals and it feels like glucose readings increase to 250-300 while she is  still eating her meal.   Was able to pick up Gvoke pen. Reviewed use today  Patient reports hypoglycemic s/sx including dizziness, shakiness, sweating even when glucose readings are in 90s  Hyperlipidemia/ASCVD Risk Reduction  Current lipid lowering medications: atorvastatin 80 mg daily  Antiplatelet regimen: none due to DOAC  Heart Failure:  Current medications:  ACEi/ARB/ARNI: previously on, limited by hypotension SGLT2i: Farxiga 10 mg daily Beta blocker: metoprolol succinate 25 mg twice daily Mineralocorticoid Receptor Antagonist: none, stopped due to renal function Diuretic regimen: none  Atrial Fibrillation:  Current medications: Rate Control: metoprolol succinate 25 mg twice daily  Rhythm Control: n/a Anticoagulation Regimen: Eliquis 5 mg twice daily  Recent diagnosis as of March. Follow up this week to determine next steps for management.   Objective:  Lab Results  Component Value Date   HGBA1C 7.1 (H) 07/13/2022    Lab Results  Component Value Date   CREATININE 1.10 (H) 09/27/2022   BUN 17 09/27/2022   NA 141 09/27/2022   K 4.0 09/27/2022   CL 106 09/27/2022   CO2 26 09/27/2022    Lab Results  Component Value Date   CHOL 201 (H) 07/13/2022   HDL 50 07/13/2022   LDLCALC 114 (H) 07/13/2022   LDLDIRECT 126 (H) 07/13/2022   TRIG 246 (H) 07/13/2022   CHOLHDL 4.0 07/13/2022    Medications Reviewed Today     Reviewed by Gardiner Barefoot, DPM (Physician) on 10/11/22 at 1431  Med List Status: <None>   Medication Order Taking? Sig Documenting Provider Last Dose  Status Informant  albuterol (VENTOLIN HFA) 108 (90 Base) MCG/ACT inhaler [id number] No Inhale 1-2 puffs into the lungs every 6 (six) hours as needed for wheezing or shortness of breath. McLean-Scocuzza, Nino Glow, MD Taking Active Self  apixaban (ELIQUIS) 5 MG TABS tablet [id number]  Take 1 tablet (5 mg total) by mouth 2 (two) times daily. Lot # [id number] Exp. 5/25 End, Harrell Gave, MD  Active    atorvastatin (LIPITOR) 80 MG tablet [id number] No Take 1 tablet (80 mg total) by mouth daily. [name], MD Taking Active   benzonatate (TESSALON) 200 MG capsule [id number] No Take 1 capsule (200 mg total) by mouth 2 (two) times daily as needed for cough. [name], MD Taking Active   butalbital-acetaminophen-caffeine (FIORICET) [phone] MG tablet [id number] No TAKE 1 TABLET BY MOUTH EVERY 6 HOURS AS NEEDED FOR HEADACHE. [name], MD Taking Active   Cholecalciferol (VITAMIN D3) 250 MCG (10000 UT) capsule [id number] No Take 10,000 Units by mouth daily. [provider] Taking Active Self  clotrimazole-betamethasone (LOTRISONE) cream [id number] No Apply 1 application topically 2 (two) times daily as needed (irritation). [provider] Taking Active Self  conjugated estrogens (PREMARIN) vaginal cream [id number] No Apply one pea-sized amount around the opening of the urethra daily for 2 weeks, then 3 times weekly moving forward.  Patient taking differently: Apply one pea-sized amount around the opening of the urethra  3 times weekly   Debroah Loop, PA-C Taking Active Self  Continuous Blood Gluc Receiver (FREESTYLE LIBRE 2 READER) DEVI [id number] No Use to check sugar at least TID  Patient not taking: Reported on 09/27/2022   [name], MD Not Taking Active Self  Continuous Blood Gluc Sensor (DEXCOM G7 SENSOR) MISC [id number]  Apply 1 sensor as directed every 10 days to continuously monitor glucose. Tomasita Morrow, NP  Active   cyanocobalamin (,VITAMIN B-12,) 1000 MCG/ML injection [id number] No Inject 1 mL (1,000 mcg total) into the muscle every 30 (thirty) days. Inject 1 ml (1000 mcg ) IM weekly x 4,  Then monthly thereafter [name], MD Taking Active Self  dapagliflozin propanediol (FARXIGA) 10 MG TABS tablet [id number] No Take 1 tablet (10 mg total) by mouth daily. [name], MD Taking Active Self  dicyclomine (BENTYL) 20 MG tablet [id number] No Take  1 tablet (20 mg total) by mouth 3 (three) times daily before meals. [name], MD Taking Active   diphenoxylate-atropine (LOMOTIL) 2.5-0.025 MG tablet [id number] No Take 1 tablet by mouth 4 (four) times daily as needed for diarrhea or loose stools.  Patient not taking: Reported on 09/27/2022   [name], MD Not Taking Active Self  DULoxetine (CYMBALTA) 30 MG capsule [id number] No Take 1 capsule (30 mg total) by mouth daily. Suzzanne Cloud, NP Taking Active   fexofenadine [facility]) 180 MG tablet [id number] No Take 180 mg by mouth as needed for allergies or rhinitis. [provider] Taking Active Self  fluticasone (FLONASE) 50 MCG/ACT nasal spray [id number] No Place 2 sprays into both nostrils daily.  Patient taking differently: Place 2 sprays into both nostrils 3 (three) times daily as needed for allergies.   Leone Haven, MD Taking Active Self  Glucagon (GVOKE HYPOPEN 2-PACK) 0.5 MG/0.1ML SOAJ [id number] No Inject 1 auto injector subcutaneously as needed for severe hypoglycemia. May repeat x 1 after 15 minutes if needed. Tomasita Morrow, NP Taking Active   glucose blood [facility] VERIO) test strip [id number] No Use to check blood sugar twice daily.  Patient not taking: Reported on 09/27/2022   [name], MD Not Taking Active Self  insulin aspart (NOVOLOG FLEXPEN) 100 UNIT/ML FlexPen [id number] No Inject 4-16 units with meals per sliding scale: If pre-meal glucose <100, inject 4 units; 100-150 - inject 6 units, 151-200 - inject 8 units; 201-250 - inject 10 units; 251-300 - inject 12 units, 301-350 - inject 14 units ; 350 > inject 16 units  Patient taking differently: Inject 4-14 Units into the skin 3 (three) times daily.   [name], MD Taking Active Self  insulin degludec (TRESIBA FLEXTOUCH) 100 UNIT/ML FlexTouch Pen [id number] No Inject 20 Units into the skin daily. [name], MD Taking Active   Insulin Pen Needle (DROPLET PEN NEEDLES) 31G X 5 MM MISC [id number] No USE  ONE NEEDLE SUBCUTANEOUSLY AS DIRECTED (REMOVE AND DISCARD NEEDLE IN SHARPS CONTAINER IMMEDIATELY AFTER USE)  Patient not taking: Reported on 09/27/2022   [name], MD Not Taking Active Self  loperamide (IMODIUM) 2 MG capsule [id number] No Take 4-6 mg by mouth 3 (three) times daily before meals. [provider] Taking Active Self  magnesium gluconate (MAGONATE) 500 MG tablet [id number] No Take 500 mg by mouth daily. [provider] Taking Active Self  metoprolol succinate (TOPROL XL) 25 MG 24 hr tablet [id number]  Take 1 tablet (25 mg total) by mouth 2 (two) times daily. End, Harrell Gave, MD  Active   nitroGLYCERIN (NITROSTAT) 0.4 MG SL tablet [id number] No Place 1 tablet (0.4 mg total) under the tongue every 5 (five) minutes as needed for chest pain. End, Harrell Gave, MD Taking Active   nystatin (MYCOSTATIN/NYSTOP) powder [id number] No Apply 1 g topically 4 (four) times daily as needed (rash). [provider] Taking Active Self  ondansetron (ZOFRAN-ODT) 4 MG disintegrating tablet [id number] No Take 1 tablet (4 mg total) by mouth every 8 (eight) hours as needed for nausea or vomiting. [name], MD Taking Active   OneTouch Delica Lancets [id number] [place] [id number] No Used to check blood sugar two times a day.  Patient not taking: Reported on 09/27/2022   [name], MD Not Taking Active Self  OVER THE COUNTER MEDICATION [id number] No Apply 1 application topically daily as needed (pain). [place] Gel topical pain reliever  Patient not taking: Reported on 09/27/2022   [provider] Not Taking Active Self  pantoprazole (PROTONIX) 40 MG tablet [id number] No Take 1 tablet (40 mg total) by mouth daily. [name], MD Taking Active   Polyethyl Glycol-Propyl Glycol [facility] OP) [id number] No Place 1 drop into both eyes 4 (four) times daily as needed (dry eyes). [provider] Taking Active Self  pregabalin (LYRICA) 50 MG capsule [id number] No TAKE 1 CAPSULE BY  MOUTH TWICE A DAY [name], MD Taking Active   Med List Note Wynona Canes, [place] 07/22/17 1307): CPAP              Assessment/Plan:   Diabetes: - Currently controlled at more relaxed goal of <8%, however, concerns with hypoglycemic symptoms even with low-normal glucose readings. Discussed more relaxed goal; recommend more lenient goals of <150 pre prandial, <200 post prandial - Reviewed dietary modifications including: focus on proteins, fibers as able - Extensive education on differences between CGM and blood glucose checks, lag time; educated that if glucose is increasing or decreasing, interstitial glucose levels and blood glucose levels will not be the same. Discussed that she should avoid being concerned with "validating" CGM readings through blood glucose fingersticks.  Adjusted low glucose alarm to <90 and turned off high glucose alarm to avoid increased stress.  - Educated on avoiding changing basal insulin dose based on blood sugars due to extended onset/duration of action of basal insulin, risk of stacking. Increase Tresiba to 12 units daily. Change Novolog to [place], as a more rapid acting insulin it may take effect sooner during her meal to prevent quick excursions - Recommend to check glucose continuously using Wise.  - Educated to only use glucagon if loss of consciousness related to hypoglycemia  Hyperlipidemia/ASCVD Risk Reduction: - Currently uncontrolled.  - Recommend to refill atorvastatin, continue atorvastatin 80 mg daily.  Heart Failure: - Currently appropriately managed per cardiology/nephrology - Recommend to continue current regimen at this time  Atrial Fibrillation: - Currently under evaluation by cardiology - Recommend to continue current regimen at this time  Follow Up Plan: phone call in 4 weeks  Catie Hedwig Morton, PharmD, [place], [facility] [phone]

## 2022-10-18 ENCOUNTER — Ambulatory Visit: Payer: Medicare Other | Attending: Internal Medicine | Admitting: Internal Medicine

## 2022-10-18 ENCOUNTER — Encounter: Payer: Self-pay | Admitting: Internal Medicine

## 2022-10-18 VITALS — BP 102/60 | HR 89 | Ht 64.5 in | Wt 244.0 lb

## 2022-10-18 DIAGNOSIS — E785 Hyperlipidemia, unspecified: Secondary | ICD-10-CM | POA: Diagnosis not present

## 2022-10-18 DIAGNOSIS — I25118 Atherosclerotic heart disease of native coronary artery with other forms of angina pectoris: Secondary | ICD-10-CM | POA: Diagnosis not present

## 2022-10-18 DIAGNOSIS — I4891 Unspecified atrial fibrillation: Secondary | ICD-10-CM

## 2022-10-18 DIAGNOSIS — I4819 Other persistent atrial fibrillation: Secondary | ICD-10-CM

## 2022-10-18 DIAGNOSIS — E1169 Type 2 diabetes mellitus with other specified complication: Secondary | ICD-10-CM | POA: Diagnosis not present

## 2022-10-18 NOTE — Patient Instructions (Signed)
Medication Instructions:  Your Physician recommend you continue on your current medication as directed.    *If you need a refill on your cardiac medications before your next appointment, please call your pharmacy*   Lab Work: Your provider would like for you to return 1 week prior to procedure to have the following labs drawn: (CBC, BMP).   Please go to the [facility] entrance and check in at the front desk.  You do not need an appointment.  They are open from 7am-6 pm.  You will not need to be fasting.    Testing/Procedures: Cardioversion (Please see instructions below)   Follow-Up: At [facility], you and your health needs are our priority.  As part of our continuing mission to provide you with exceptional heart care, we have created designated Provider Care Teams.  These Care Teams include your primary Cardiologist (physician) and Advanced Practice Providers (APPs -  Physician Assistants and Nurse Practitioners) who all work together to provide you with the care you need, when you need it.  We recommend signing up for the patient portal called "MyChart".  Sign up information is provided on this After Visit Summary.  MyChart is used to connect with patients for Virtual Visits (Telemedicine).  Patients are able to view lab/test results, encounter notes, upcoming appointments, etc.  Non-urgent messages can be sent to your provider as well.   To learn more about what you can do with MyChart, go to [email or web address].    Your next appointment:   4 week(s)   Provider:   You will see one of the following Advanced Practice Providers on your designated Care Team:   Murray Hodgkins, NP Christell Faith, PA-C Cadence Kathlen Mody, PA-C Gerrie Nordmann, NP  You are scheduled for a Cardioversion on October 30, 2022 with Dr. Saunders Revel.    Please arrive at the [facility], [place], [place] at 6:30 am (This is 1 hour prior to your procedure  time).  Proceed to the Check-In Desk directly inside the entrance.  Procedure Parking: Use the entrance off of the [place] side of the hospital. Turn right upon entering and follow the driveway to parking that is directly in front of the [place]. There is no valet parking available at this entrance, however there is an awning directly in front of the [place] for drop off/ pick up for patients  DIET: Nothing to eat or drink after midnight except a sip of water with medications (see medication instructions below)  Medication Instructions: Hold all diabetic medication the morning of procedure Continue your anticoagulant: Eliquis If you miss a dose, please call us at [phone] You will need to continue your anticoagulant after your procedure until you are told by your provider that it is safe to stop.   Labs: 1 week prior to procedure FYI: For your safety, and to allow [place] to monitor your vital signs accurately during the surgery/procedure we request that if you have artificial nails, gel coating, SNS etc. Please have those removed prior to your surgery/procedure. Not having the nail coverings /polish removed may result in cancellation or delay of your surgery/procedure.  You must have a responsible person to drive you home and stay in the waiting area during your procedure. Failure to do so could result in cancellation.  Bring your insurance cards.  If you have any questions after you get home, please call the office at [phone]  *  Special Note: Every effort is made to have your procedure done on time. Occasionally there are emergencies that occur at the hospital that may cause delays. Please be patient if a delay does occur.

## 2022-10-19 NOTE — H&P (View-Only) (Signed)
 Follow-up Outpatient Visit Date: 10/18/2022  Primary Care Provider: Tullo, Teresa L, MD [address] [place] [address]  Chief Complaint: Follow-up atrial fibrillation  HPI:  Denise Sharp is a 77 y.o. female with history of CAD status post PCI to the ostial and mid LAD in the setting of unstable angina in 12/2019, chronic HFpEF, recently diagnosed atrial fibrillation, hypertension, type 2 diabetes mellitus, iron deficiency anemia following gastric bypass x2, irritable bowel syndrome, esophageal stenosis, chronic dizziness with gait instability, cervicogenic headache with cervical spine stenosis, obstructive sleep apnea on CPAP, restless leg syndrome, and adrenal mass , who presents for follow-up of atrial fibrillation.  I last saw her about 3 weeks ago, at which time she had multiple complaints.  She was found to be in atrial fibrillation, which was a new diagnosis for her.  We agreed to increase metoprolol for improved rate control as well as add apixaban 5 mg BID.  Today, Ms. Moccia reports that she is feeling a little better, though she continues to have significant fatigue and shortness of breath with minimal activity.  She has also had intermittent episodes of chest pain for which she has needed SL nitroglycerin twice.  Heart rates have been quite labile, up to 140 bpm at times.  However, overall heart rate trend has been improving with some resting readings in the 80's the last couple of days.  She denies edema but feels like her abdomen gets bloated anytime she eats, even a small meal.  She has been compliant with her medications and has not missed any doses of apixaban since our visit last month.  --------------------------------------------------------------------------------------------------  Past Medical History:  Diagnosis Date   (HFpEF) heart failure with preserved ejection fraction    a. 12/2019 Echo: EF 65-70%, Gr2 DD. No significant valvular dzs.    Abnormality of gait 03/25/2013   Adrenal mass, left    Anginal pain    Anxiety    Aortic atherosclerosis    Arthritis    Asthma    CAD (coronary artery disease) with h/o Atypical Chest Pain    a. 04/1986 Cath ([facility]): nl cors, EF 65%; b. 07/2012 Cath ([facility]): Diff minor irregs; c. 07/2016 MV ([facility]): "Equivocal"; d. 08/2016 Cardiac CT Ca2+ score ([facility]): Ca2+ 1548; e. 05/2019 MV: No ischemia. EF 75%; f. 12/2019 PCI: LM nl, LAD 65ost (3.5x12 Resolute DES), 85m (2.75x12 Resolute DES),LCX/RCA nl; g. 08/2020 Cath: patent LAD stents, RCA 40ost, elev RH pressures, EF >65%.   Cervical spinal stenosis 1994   due to trauma to back ([employer] accident), has intermittent paralysis and parasthesias   Cervicogenic headache 03/23/2014   CKD (chronic kidney disease), stage III    Depression    Diverticulosis    Dizziness    a.) chronic   DJD (degenerative joint disease)    a. Chronic R shoulder pain   Dyspnea    Esophageal stenosis 09/2009   a.) transient outlet obstruction by food, cleared by EGD   Family history of adverse reaction to anesthesia    a.) daughter with (+) PONV   Gastric bypass status for obesity    GERD (gastroesophageal reflux disease)    Headache(784.0)    HLD (hyperlipidemia)    Hypertension    IBS (irritable bowel syndrome)    IDA (iron deficiency anemia)    a.) post 2 unit txfsn 2009, normal endo/colonoscopy by [facility]   Left bundle branch block (LBBB)    a.) Intermittently present - likely rate related.   NSVT (nonsustained   ventricular tachycardia) 11/15/2020   a.) Holter study 11/15/2020; 11 episodes of NSVT lasting up to 8 beats with a maximum rate of 210 bpm; 14 atrial runs lasting up to 18 beats with a rate of up to 250 bpm.  Some atrial runs felt to be NSVT.   NSVT (nonsustained ventricular tachycardia)    a. 10/2020 [facility]: 11 runs of NSVT up to 8 beats.   Obesity    OSA on CPAP    Polyneuropathy in diabetes(357.2) 03/25/2013   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/09/2021   Right V1  distribution   PSVT (paroxysmal supraventricular tachycardia)    a. 10/2020 [facility]: 14 atrial runs up to 18 beats, max HR 250.   Restless leg syndrome    Rotator cuff arthropathy, right 08/13/2017   Syncope and collapse 03/12/2014   Type II diabetes mellitus    Past Surgical History:  Procedure Laterality Date   APPENDECTOMY     BACK SURGERY     CHOLECYSTECTOMY N/A 1976   COLONOSCOPY     CORONARY ANGIOPLASTY     CORONARY STENT INTERVENTION N/A 01/04/2020   Procedure: CORONARY STENT INTERVENTION;  Surgeon: Iran Ouch, MD;  Location: [facility];  Service: Cardiovascular;  Laterality: N/A;  LAD    DIAPHRAGMATIC HERNIA REPAIR  2015   ESOPHAGEAL DILATION     multiple   ESOPHAGOGASTRODUODENOSCOPY (EGD) WITH PROPOFOL N/A 05/11/2021   Procedure: ESOPHAGOGASTRODUODENOSCOPY (EGD) WITH PROPOFOL;  Surgeon: [name], MD;  Location: [facility];  Service: Endoscopy;  Laterality: N/A;   ESOPHAGOGASTRODUODENOSCOPY (EGD) WITH PROPOFOL N/A 06/13/2021   Procedure: ESOPHAGOGASTRODUODENOSCOPY (EGD) WITH PROPOFOL;  Surgeon: [name], MD;  Location: [facility];  Service: Endoscopy;  Laterality: N/A;   EYE SURGERY     GASTRIC BYPASS  2000, 2005   Dr. Arlana Pouch IMPLANT PLACEMENT  10/2011   Jeannette How IMPLANT PLACEMENT N/A 09/18/2021   Procedure: Leane Platt IMPLANT FIRST STAGE;  Surgeon: Alfredo Martinez, MD;  Location: [facility];  Service: Urology;  Laterality: N/A;   INTERSTIM IMPLANT PLACEMENT N/A 09/18/2021   Procedure: Leane Platt IMPLANT SECOND STAGE WITH IMPEDENCE CHECK;  Surgeon: Alfredo Martinez, MD;  Location: [facility];  Service: Urology;  Laterality: N/A;   INTERSTIM IMPLANT REMOVAL N/A 09/18/2021   Procedure: REMOVAL OF INTERSTIM IMPLANT;  Surgeon: Alfredo Martinez, MD;  Location: [facility];  Service: Urology;  Laterality: N/A;   LEFT HEART CATH AND CORONARY ANGIOGRAPHY N/A 04/1986   LEFT HEART CATH AND CORONARY ANGIOGRAPHY Left 07/24/2012   LEFT  HEART CATH AND CORS/GRAFTS ANGIOGRAPHY N/A 12/31/2019   Procedure: LEFT HEART CATH AND CORS/GRAFTS ANGIOGRAPHY;  Surgeon: Antonieta Iba, MD;  Location: [facility];  Service: Cardiovascular;  Laterality: N/A;   PANNICULECTOMY N/A 06/16/2019   Procedure: PANNICULECTOMY;  Surgeon: Allena Napoleon, MD;  Location: [facility];  Service: Plastics;  Laterality: N/A;  3 hours, please   REVERSE SHOULDER ARTHROPLASTY Right 08/13/2017   Procedure: REVERSE RIGHT SHOULDER ARTHROPLASTY;  Surgeon: Teryl Lucy, MD;  Location: [facility];  Service: Orthopedics;  Laterality: Right;   RIGHT/LEFT HEART CATH AND CORONARY ANGIOGRAPHY N/A 09/06/2020   Procedure: RIGHT/LEFT HEART CATH AND CORONARY ANGIOGRAPHY;  Surgeon: [name], MD;  Location: [facility];  Service: Cardiovascular;  Laterality: N/A;   RIGHT/LEFT HEART CATH AND CORONARY ANGIOGRAPHY N/A 06/04/2022   Procedure: RIGHT/LEFT HEART CATH AND CORONARY ANGIOGRAPHY;  Surgeon: [name], MD;  Location: [facility];  Service: Cardiovascular;  Laterality: N/A;   ROTATOR CUFF REPAIR  Right    SPINE SURGERY  1995   [place]   TOOTH EXTRACTION  11/13/2021   TOTAL ABDOMINAL HYSTERECTOMY W/ BILATERAL SALPINGOOPHORECTOMY  1974   TOTAL KNEE ARTHROPLASTY Bilateral 2007   Procedure: TOTAL KNEE ARTHROPLASTY; Surgeons: Gerrit Heckaliff, MD and [name], MD   UMBILICAL HERNIA REPAIR  08/11/2015    Current Meds  Medication Sig   albuterol (VENTOLIN HFA) 108 (90 Base) MCG/ACT inhaler Inhale 1-2 puffs into the lungs every 6 (six) hours as needed for wheezing or shortness of breath.   apixaban (ELIQUIS) 5 MG TABS tablet Take 1 tablet (5 mg total) by mouth 2 (two) times daily. Lot # [id number] Exp. 5/25   atorvastatin (LIPITOR) 80 MG tablet Take 1 tablet (80 mg total) by mouth daily.   benzonatate (TESSALON) 200 MG capsule Take 1 capsule (200 mg total) by mouth 2 (two) times daily as needed for cough.   butalbital-acetaminophen-caffeine (FIORICET) 50-325-40  MG tablet TAKE 1 TABLET BY MOUTH EVERY 6 HOURS AS NEEDED FOR HEADACHE.   Cholecalciferol (VITAMIN D3) 250 MCG (10000 UT) capsule Take 10,000 Units by mouth daily.   clotrimazole-betamethasone (LOTRISONE) cream Apply 1 application topically 2 (two) times daily as needed (irritation).   conjugated estrogens (PREMARIN) vaginal cream Apply one pea-sized amount around the opening of the urethra daily for 2 weeks, then 3 times weekly moving forward. (Patient taking differently: Apply one pea-sized amount around the opening of the urethra  3 times weekly)   Continuous Blood Gluc Receiver (FREESTYLE LIBRE 2 READER) DEVI Use to check sugar at least TID   cyanocobalamin (,VITAMIN B-12,) 1000 MCG/ML injection Inject 1 mL (1,000 mcg total) into the muscle every 30 (thirty) days. Inject 1 ml (1000 mcg ) IM weekly x 4,  Then monthly thereafter   dapagliflozin propanediol (FARXIGA) 10 MG TABS tablet Take 1 tablet (10 mg total) by mouth daily.   dicyclomine (BENTYL) 20 MG tablet Take 1 tablet (20 mg total) by mouth 3 (three) times daily before meals.   DULoxetine (CYMBALTA) 30 MG capsule Take 1 capsule (30 mg total) by mouth daily.   fexofenadine (ALLEGRA) 180 MG tablet Take 180 mg by mouth as needed for allergies or rhinitis.   fluticasone (FLONASE) 50 MCG/ACT nasal spray Place 2 sprays into both nostrils daily. (Patient taking differently: Place 2 sprays into both nostrils 3 (three) times daily as needed for allergies.)   Glucagon (GVOKE HYPOPEN 2-PACK) 0.5 MG/0.1ML SOAJ Inject 1 auto injector subcutaneously as needed for severe hypoglycemia. May repeat x 1 after 15 minutes if needed.   glucose blood (ONETOUCH VERIO) test strip Use to check blood sugar twice daily.   insulin aspart (FIASP FLEXTOUCH) 100 UNIT/ML FlexTouch Pen Inject 4-14 Units into the skin 3 (three) times daily. Inject 4-16 units with meals per sliding scale: If pre-meal glucose <100, inject 4 units; 100-150 - inject 6 units, 151-200 - inject 8  units; 201-250 - inject 10 units; 251-300 - inject 12 units, 301-350 - inject 14 units ; 350 > inject 16 units   insulin degludec (TRESIBA FLEXTOUCH) 100 UNIT/ML FlexTouch Pen Inject 12 Units into the skin daily.   Insulin Pen Needle (DROPLET PEN NEEDLES) 31G X 5 MM MISC USE ONE NEEDLE SUBCUTANEOUSLY AS DIRECTED (REMOVE AND DISCARD NEEDLE IN SHARPS CONTAINER IMMEDIATELY AFTER USE)   loperamide (IMODIUM) 2 MG capsule Take 4-6 mg by mouth 3 (three) times daily before meals.   magnesium gluconate (MAGONATE) 500 MG tablet Take 500 mg by mouth daily.   metoprolol succinate (TOPROL  XL) 25 MG 24 hr tablet Take 1 tablet (25 mg total) by mouth 2 (two) times daily.   nitroGLYCERIN (NITROSTAT) 0.4 MG SL tablet Place 1 tablet (0.4 mg total) under the tongue every 5 (five) minutes as needed for chest pain.   nystatin (MYCOSTATIN/NYSTOP) powder Apply 1 g topically 4 (four) times daily as needed (rash).   ondansetron (ZOFRAN-ODT) 4 MG disintegrating tablet Take 1 tablet (4 mg total) by mouth every 8 (eight) hours as needed for nausea or vomiting.   OneTouch Delica Lancets 33G MISC Used to check blood sugar two times a day.   OVER THE COUNTER MEDICATION Apply 1 application  topically daily as needed (pain). [place] Gel topical pain reliever   pantoprazole (PROTONIX) 40 MG tablet Take 1 tablet (40 mg total) by mouth daily.   Polyethyl Glycol-Propyl Glycol (SYSTANE OP) Place 1 drop into both eyes 4 (four) times daily as needed (dry eyes).   pregabalin (LYRICA) 50 MG capsule TAKE 1 CAPSULE BY MOUTH TWICE A DAY    Allergies: Demeclocycline, Erythromycin, Flagyl [metronidazole], Glucophage [metformin hcl], Tetracyclines & related, Furosemide, Tetracycline, Diovan [valsartan], Sulfa antibiotics, and Xanax [alprazolam]  Social History   Tobacco Use   Smoking status: Never    Passive exposure: Never   Smokeless tobacco: Never  Vaping Use   Vaping Use: Never used  Substance Use Topics   Alcohol use: No   Drug use:  Not Currently    Comment: prescribed valium    Family History  Problem Relation Age of Onset   Heart disease Father    Hypertension Father    Prostate cancer Father    Stroke Father    Osteoporosis Father    Stroke Mother    Depression Mother    Headache Mother    Heart disease Mother    Thyroid disease Mother    Hypertension Mother    Diabetes Daughter    Heart disease Daughter    Hypertension Daughter    Hypertension Son     Review of Systems: A 12-system review of systems was performed and was negative except as noted in the HPI.  --------------------------------------------------------------------------------------------------  Physical Exam: BP 102/60 (BP Location: Right Arm, Patient Position: Sitting, Cuff Size: Normal)   Pulse 89   Ht 5' 4.5" (1.638 m)   Wt 244 lb (110.7 kg)   SpO2 98%   BMI 41.24 kg/m   General:  NAD. Neck: No JVD or HJR. Lungs: Mildly diminished breath sounds throughout without wheezes or crackles. Heart: Irregularly irregular rhythm without murmurs; distant heart sounds. Abdomen: Obese but soft with mild RLQ tenderness. Extremities: No lower extremity edema.  EKG:  Atrial fibrillation (HR 89 bpm) with low voltage.  Lab Results  Component Value Date   WBC 13.6 (H) 09/27/2022   HGB 13.6 09/27/2022   HCT 42.6 09/27/2022   MCV 92.2 09/27/2022   PLT 301 09/27/2022    Lab Results  Component Value Date   NA 141 09/27/2022   K 4.0 09/27/2022   CL 106 09/27/2022   CO2 26 09/27/2022   BUN 17 09/27/2022   CREATININE 1.10 (H) 09/27/2022   GLUCOSE 109 (H) 09/27/2022   ALT 14 09/27/2022    Lab Results  Component Value Date   CHOL 201 (H) 07/13/2022   HDL 50 07/13/2022   LDLCALC 114 (H) 07/13/2022   LDLDIRECT 126 (H) 07/13/2022   TRIG 246 (H) 07/13/2022   CHOLHDL 4.0 07/13/2022    --------------------------------------------------------------------------------------------------  ASSESSMENT AND PLAN: Persistent atrial  fibrillation:  Ventricular rates are better today, though Ms. Jarold Mottoatterson continues to note some higher readings at home.  I suspect some of her chest pain, dyspnea, and fatigue are related to this (though some of these symptoms have been longstanding).  Her borderline low BP today precludes further escalation of metoprolol.  We will therefore continue her current doses of metoprolol and apixaban.  I stressed the importance of remaining compliant with her anticoagulation.  We will proceed with cardioversion in just over a week, as she will have completed 4 weeks of therapeutic anticoagulation at that time.  We discussed doing TEE/DCCV sooner, but given her complex GI history with prior gastric bypass and esophageal stricture requiring dilation, I would like to avoid TEE if possible.  If we are unable to maintain sinus rhythm following cardioversion, EP consultation will need to be considered.  Coronary artery disease with stable angina: Most recent cath in 05/2022 showed patent stents with otherwise mild CAD.  I suspect her chronic intermittent chest pain is not due to significant epicardial CAD.  Continue medical therapy and secondary prevention.  Hyperlipidemia associated with type 2 diabetes mellitus: LDL suboptimally controlled on last check in 06/2022.  Continue atovastatin 80 mg daily with repeat lipid panel with next blood draw to see if adjunctive therapy with ezetimibe or PCSK9 inhibitor is needed.  Ongoing management of DM per Dr. Darrick Huntsmanullo.  Morbid obesity: BMI remains >40 with multiple comorbidites.  Patient would benefit from weight loss and increased activity once a-fib is better-controlled.  Shared Decision Making/Informed Consent The risks (stroke, cardiac arrhythmias rarely resulting in the need for a temporary or permanent pacemaker, skin irritation or burns and complications associated with conscious sedation including aspiration, arrhythmia, respiratory failure and death), benefits  (restoration of normal sinus rhythm) and alternatives of a direct current cardioversion were explained in detail to Ms. Jarold MottoPatterson and she agrees to proceed.   Follow-up: Return to clinic in 3-4 weeks.  Yvonne Kendallhristopher Johnatha Zeidman, MD 10/19/2022 12:49 PM

## 2022-10-19 NOTE — Progress Notes (Signed)
Follow-up Outpatient Visit Date: 10/18/2022  Primary Care Provider: Sherlene Shams, MD [address] [place] [address]  Chief Complaint: Follow-up atrial fibrillation  HPI:  Denise Sharp is a 77 y.o. female with history of CAD status post PCI to the ostial and mid LAD in the setting of unstable angina in 12/2019, chronic HFpEF, recently diagnosed atrial fibrillation, hypertension, type 2 diabetes mellitus, iron deficiency anemia following gastric bypass x2, irritable bowel syndrome, esophageal stenosis, chronic dizziness with gait instability, cervicogenic headache with cervical spine stenosis, obstructive sleep apnea on CPAP, restless leg syndrome, and adrenal mass , who presents for follow-up of atrial fibrillation.  I last saw her about 3 weeks ago, at which time she had multiple complaints.  She was found to be in atrial fibrillation, which was a new diagnosis for her.  We agreed to increase metoprolol for improved rate control as well as add apixaban 5 mg BID.  Today, [name] reports that she is feeling a little better, though she continues to have significant fatigue and shortness of breath with minimal activity.  She has also had intermittent episodes of chest pain for which she has needed SL nitroglycerin twice.  Heart rates have been quite labile, up to 140 bpm at times.  However, overall heart rate trend has been improving with some resting readings in the 80's the last couple of days.  She denies edema but feels like her abdomen gets bloated anytime she eats, even a small meal.  She has been compliant with her medications and has not missed any doses of apixaban since our visit last month.  --------------------------------------------------------------------------------------------------  Past Medical History:  Diagnosis Date   (HFpEF) heart failure with preserved ejection fraction    a. 12/2019 Echo: EF 65-70%, Gr2 DD. No significant valvular dzs.    Abnormality of gait 03/25/2013   Adrenal mass, left    Anginal pain    Anxiety    Aortic atherosclerosis    Arthritis    Asthma    CAD (coronary artery disease) with h/o Atypical Chest Pain    a. 04/1986 Cath ([facility]): nl cors, EF 65%; b. 07/2012 Cath [facility]): Diff minor irregs; c. 07/2016 MV [facility]): "Equivocal"; d. 08/2016 Cardiac CT Ca2+ score [facility]): Ca2+ 1548; e. 05/2019 MV: No ischemia. EF 75%; f. 12/2019 PCI: LM nl, LAD 65ost (3.5x12 Resolute DES), 46m (2.75x12 Resolute DES),LCX/RCA nl; g. 08/2020 Cath: patent LAD stents, RCA 40ost, elev RH pressures, EF >65%.   Cervical spinal stenosis 1994   due to trauma to back ([employer] accident), has intermittent paralysis and parasthesias   Cervicogenic headache 03/23/2014   CKD (chronic kidney disease), stage III    Depression    Diverticulosis    Dizziness    a.) chronic   DJD (degenerative joint disease)    a. Chronic R shoulder pain   Dyspnea    Esophageal stenosis 09/2009   a.) transient outlet obstruction by food, cleared by EGD   Family history of adverse reaction to anesthesia    a.) daughter with (+) PONV   Gastric bypass status for obesity    GERD (gastroesophageal reflux disease)    Headache(784.0)    HLD (hyperlipidemia)    Hypertension    IBS (irritable bowel syndrome)    IDA (iron deficiency anemia)    a.) post 2 unit txfsn 2009, normal endo/colonoscopy by [facility]   Left bundle branch block (LBBB)    a.) Intermittently present - likely rate related.   NSVT (nonsustained  ventricular tachycardia) 11/15/2020   a.) Holter study 11/15/2020; 11 episodes of NSVT lasting up to 8 beats with a maximum rate of 210 bpm; 14 atrial runs lasting up to 18 beats with a rate of up to 250 bpm.  Some atrial runs felt to be NSVT.   NSVT (nonsustained ventricular tachycardia)    a. 10/2020 [facility]: 11 runs of NSVT up to 8 beats.   Obesity    OSA on CPAP    Polyneuropathy in diabetes(357.2) 03/25/2013   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/09/2021   Right V1  distribution   PSVT (paroxysmal supraventricular tachycardia)    a. 10/2020 [facility]: 14 atrial runs up to 18 beats, max HR 250.   Restless leg syndrome    Rotator cuff arthropathy, right 08/13/2017   Syncope and collapse 03/12/2014   Type II diabetes mellitus    Past Surgical History:  Procedure Laterality Date   APPENDECTOMY     BACK SURGERY     CHOLECYSTECTOMY N/A 1976   COLONOSCOPY     CORONARY ANGIOPLASTY     CORONARY STENT INTERVENTION N/A 01/04/2020   Procedure: CORONARY STENT INTERVENTION;  Surgeon: Iran Ouch, MD;  Location: [facility];  Service: Cardiovascular;  Laterality: N/A;  LAD    DIAPHRAGMATIC HERNIA REPAIR  2015   ESOPHAGEAL DILATION     multiple   ESOPHAGOGASTRODUODENOSCOPY (EGD) WITH PROPOFOL N/A 05/11/2021   Procedure: ESOPHAGOGASTRODUODENOSCOPY (EGD) WITH PROPOFOL;  Surgeon: [name], MD;  Location: [facility];  Service: Endoscopy;  Laterality: N/A;   ESOPHAGOGASTRODUODENOSCOPY (EGD) WITH PROPOFOL N/A 06/13/2021   Procedure: ESOPHAGOGASTRODUODENOSCOPY (EGD) WITH PROPOFOL;  Surgeon: [name], MD;  Location: [facility];  Service: Endoscopy;  Laterality: N/A;   EYE SURGERY     GASTRIC BYPASS  2000, 2005   Dr. Arlana Pouch IMPLANT PLACEMENT  10/2011   Jeannette How IMPLANT PLACEMENT N/A 09/18/2021   Procedure: Leane Platt IMPLANT FIRST STAGE;  Surgeon: Alfredo Martinez, MD;  Location: [facility];  Service: Urology;  Laterality: N/A;   INTERSTIM IMPLANT PLACEMENT N/A 09/18/2021   Procedure: Leane Platt IMPLANT SECOND STAGE WITH IMPEDENCE CHECK;  Surgeon: Alfredo Martinez, MD;  Location: [facility];  Service: Urology;  Laterality: N/A;   INTERSTIM IMPLANT REMOVAL N/A 09/18/2021   Procedure: REMOVAL OF INTERSTIM IMPLANT;  Surgeon: Alfredo Martinez, MD;  Location: [facility];  Service: Urology;  Laterality: N/A;   LEFT HEART CATH AND CORONARY ANGIOGRAPHY N/A 04/1986   LEFT HEART CATH AND CORONARY ANGIOGRAPHY Left 07/24/2012   LEFT  HEART CATH AND CORS/GRAFTS ANGIOGRAPHY N/A 12/31/2019   Procedure: LEFT HEART CATH AND CORS/GRAFTS ANGIOGRAPHY;  Surgeon: Antonieta Iba, MD;  Location: [facility];  Service: Cardiovascular;  Laterality: N/A;   PANNICULECTOMY N/A 06/16/2019   Procedure: PANNICULECTOMY;  Surgeon: Allena Napoleon, MD;  Location: [facility];  Service: Plastics;  Laterality: N/A;  3 hours, please   REVERSE SHOULDER ARTHROPLASTY Right 08/13/2017   Procedure: REVERSE RIGHT SHOULDER ARTHROPLASTY;  Surgeon: Teryl Lucy, MD;  Location: [facility];  Service: Orthopedics;  Laterality: Right;   RIGHT/LEFT HEART CATH AND CORONARY ANGIOGRAPHY N/A 09/06/2020   Procedure: RIGHT/LEFT HEART CATH AND CORONARY ANGIOGRAPHY;  Surgeon: [name], MD;  Location: [facility];  Service: Cardiovascular;  Laterality: N/A;   RIGHT/LEFT HEART CATH AND CORONARY ANGIOGRAPHY N/A 06/04/2022   Procedure: RIGHT/LEFT HEART CATH AND CORONARY ANGIOGRAPHY;  Surgeon: [name], MD;  Location: [facility];  Service: Cardiovascular;  Laterality: N/A;   ROTATOR CUFF REPAIR  Right    SPINE SURGERY  1995   [place]   TOOTH EXTRACTION  11/13/2021   TOTAL ABDOMINAL HYSTERECTOMY W/ BILATERAL SALPINGOOPHORECTOMY  1974   TOTAL KNEE ARTHROPLASTY Bilateral 2007   Procedure: TOTAL KNEE ARTHROPLASTY; Surgeons: Gerrit Heckaliff, MD and [name], MD   UMBILICAL HERNIA REPAIR  08/11/2015    Current Meds  Medication Sig   albuterol (VENTOLIN HFA) 108 (90 Base) MCG/ACT inhaler Inhale 1-2 puffs into the lungs every 6 (six) hours as needed for wheezing or shortness of breath.   apixaban (ELIQUIS) 5 MG TABS tablet Take 1 tablet (5 mg total) by mouth 2 (two) times daily. Lot # [id number] Exp. 5/25   atorvastatin (LIPITOR) 80 MG tablet Take 1 tablet (80 mg total) by mouth daily.   benzonatate (TESSALON) 200 MG capsule Take 1 capsule (200 mg total) by mouth 2 (two) times daily as needed for cough.   butalbital-acetaminophen-caffeine (FIORICET) 50-325-40  MG tablet TAKE 1 TABLET BY MOUTH EVERY 6 HOURS AS NEEDED FOR HEADACHE.   Cholecalciferol (VITAMIN D3) 250 MCG (10000 UT) capsule Take 10,000 Units by mouth daily.   clotrimazole-betamethasone (LOTRISONE) cream Apply 1 application topically 2 (two) times daily as needed (irritation).   conjugated estrogens (PREMARIN) vaginal cream Apply one pea-sized amount around the opening of the urethra daily for 2 weeks, then 3 times weekly moving forward. (Patient taking differently: Apply one pea-sized amount around the opening of the urethra  3 times weekly)   Continuous Blood Gluc Receiver (FREESTYLE LIBRE 2 READER) DEVI Use to check sugar at least TID   cyanocobalamin (,VITAMIN B-12,) 1000 MCG/ML injection Inject 1 mL (1,000 mcg total) into the muscle every 30 (thirty) days. Inject 1 ml (1000 mcg ) IM weekly x 4,  Then monthly thereafter   dapagliflozin propanediol (FARXIGA) 10 MG TABS tablet Take 1 tablet (10 mg total) by mouth daily.   dicyclomine (BENTYL) 20 MG tablet Take 1 tablet (20 mg total) by mouth 3 (three) times daily before meals.   DULoxetine (CYMBALTA) 30 MG capsule Take 1 capsule (30 mg total) by mouth daily.   fexofenadine (ALLEGRA) 180 MG tablet Take 180 mg by mouth as needed for allergies or rhinitis.   fluticasone (FLONASE) 50 MCG/ACT nasal spray Place 2 sprays into both nostrils daily. (Patient taking differently: Place 2 sprays into both nostrils 3 (three) times daily as needed for allergies.)   Glucagon (GVOKE HYPOPEN 2-PACK) 0.5 MG/0.1ML SOAJ Inject 1 auto injector subcutaneously as needed for severe hypoglycemia. May repeat x 1 after 15 minutes if needed.   glucose blood (ONETOUCH VERIO) test strip Use to check blood sugar twice daily.   insulin aspart (FIASP FLEXTOUCH) 100 UNIT/ML FlexTouch Pen Inject 4-14 Units into the skin 3 (three) times daily. Inject 4-16 units with meals per sliding scale: If pre-meal glucose <100, inject 4 units; 100-150 - inject 6 units, 151-200 - inject 8  units; 201-250 - inject 10 units; 251-300 - inject 12 units, 301-350 - inject 14 units ; 350 > inject 16 units   insulin degludec (TRESIBA FLEXTOUCH) 100 UNIT/ML FlexTouch Pen Inject 12 Units into the skin daily.   Insulin Pen Needle (DROPLET PEN NEEDLES) 31G X 5 MM MISC USE ONE NEEDLE SUBCUTANEOUSLY AS DIRECTED (REMOVE AND DISCARD NEEDLE IN SHARPS CONTAINER IMMEDIATELY AFTER USE)   loperamide (IMODIUM) 2 MG capsule Take 4-6 mg by mouth 3 (three) times daily before meals.   magnesium gluconate (MAGONATE) 500 MG tablet Take 500 mg by mouth daily.   metoprolol succinate (TOPROL  XL) 25 MG 24 hr tablet Take 1 tablet (25 mg total) by mouth 2 (two) times daily.   nitroGLYCERIN (NITROSTAT) 0.4 MG SL tablet Place 1 tablet (0.4 mg total) under the tongue every 5 (five) minutes as needed for chest pain.   nystatin (MYCOSTATIN/NYSTOP) powder Apply 1 g topically 4 (four) times daily as needed (rash).   ondansetron (ZOFRAN-ODT) 4 MG disintegrating tablet Take 1 tablet (4 mg total) by mouth every 8 (eight) hours as needed for nausea or vomiting.   OneTouch Delica Lancets 33G MISC Used to check blood sugar two times a day.   OVER THE COUNTER MEDICATION Apply 1 application  topically daily as needed (pain). [place] Gel topical pain reliever   pantoprazole (PROTONIX) 40 MG tablet Take 1 tablet (40 mg total) by mouth daily.   Polyethyl Glycol-Propyl Glycol (SYSTANE OP) Place 1 drop into both eyes 4 (four) times daily as needed (dry eyes).   pregabalin (LYRICA) 50 MG capsule TAKE 1 CAPSULE BY MOUTH TWICE A DAY    Allergies: Demeclocycline, Erythromycin, Flagyl [metronidazole], Glucophage [metformin hcl], Tetracyclines & related, Furosemide, Tetracycline, Diovan [valsartan], Sulfa antibiotics, and Xanax [alprazolam]  Social History   Tobacco Use   Smoking status: Never    Passive exposure: Never   Smokeless tobacco: Never  Vaping Use   Vaping Use: Never used  Substance Use Topics   Alcohol use: No   Drug use:  Not Currently    Comment: prescribed valium    Family History  Problem Relation Age of Onset   Heart disease Father    Hypertension Father    Prostate cancer Father    Stroke Father    Osteoporosis Father    Stroke Mother    Depression Mother    Headache Mother    Heart disease Mother    Thyroid disease Mother    Hypertension Mother    Diabetes Daughter    Heart disease Daughter    Hypertension Daughter    Hypertension Son     Review of Systems: A 12-system review of systems was performed and was negative except as noted in the HPI.  --------------------------------------------------------------------------------------------------  Physical Exam: BP 102/60 (BP Location: Right Arm, Patient Position: Sitting, Cuff Size: Normal)   Pulse 89   Ht 5' 4.5" (1.638 m)   Wt 244 lb (110.7 kg)   SpO2 98%   BMI 41.24 kg/m   General:  NAD. Neck: No JVD or HJR. Lungs: Mildly diminished breath sounds throughout without wheezes or crackles. Heart: Irregularly irregular rhythm without murmurs; distant heart sounds. Abdomen: Obese but soft with mild RLQ tenderness. Extremities: No lower extremity edema.  EKG:  Atrial fibrillation (HR 89 bpm) with low voltage.  Lab Results  Component Value Date   WBC 13.6 (H) 09/27/2022   HGB 13.6 09/27/2022   HCT 42.6 09/27/2022   MCV 92.2 09/27/2022   PLT 301 09/27/2022    Lab Results  Component Value Date   NA 141 09/27/2022   K 4.0 09/27/2022   CL 106 09/27/2022   CO2 26 09/27/2022   BUN 17 09/27/2022   CREATININE 1.10 (H) 09/27/2022   GLUCOSE 109 (H) 09/27/2022   ALT 14 09/27/2022    Lab Results  Component Value Date   CHOL 201 (H) 07/13/2022   HDL 50 07/13/2022   LDLCALC 114 (H) 07/13/2022   LDLDIRECT 126 (H) 07/13/2022   TRIG 246 (H) 07/13/2022   CHOLHDL 4.0 07/13/2022    --------------------------------------------------------------------------------------------------  ASSESSMENT AND PLAN: Persistent atrial  fibrillation:  Ventricular rates are better today, though Ms. Jarold Mottoatterson continues to note some higher readings at home.  I suspect some of her chest pain, dyspnea, and fatigue are related to this (though some of these symptoms have been longstanding).  Her borderline low BP today precludes further escalation of metoprolol.  We will therefore continue her current doses of metoprolol and apixaban.  I stressed the importance of remaining compliant with her anticoagulation.  We will proceed with cardioversion in just over a week, as she will have completed 4 weeks of therapeutic anticoagulation at that time.  We discussed doing TEE/DCCV sooner, but given her complex GI history with prior gastric bypass and esophageal stricture requiring dilation, I would like to avoid TEE if possible.  If we are unable to maintain sinus rhythm following cardioversion, EP consultation will need to be considered.  Coronary artery disease with stable angina: Most recent cath in 05/2022 showed patent stents with otherwise mild CAD.  I suspect her chronic intermittent chest pain is not due to significant epicardial CAD.  Continue medical therapy and secondary prevention.  Hyperlipidemia associated with type 2 diabetes mellitus: LDL suboptimally controlled on last check in 06/2022.  Continue atovastatin 80 mg daily with repeat lipid panel with next blood draw to see if adjunctive therapy with ezetimibe or PCSK9 inhibitor is needed.  Ongoing management of DM per Dr. Darrick Huntsmanullo.  Morbid obesity: BMI remains >40 with multiple comorbidites.  Patient would benefit from weight loss and increased activity once a-fib is better-controlled.  Shared Decision Making/Informed Consent The risks (stroke, cardiac arrhythmias rarely resulting in the need for a temporary or permanent pacemaker, skin irritation or burns and complications associated with conscious sedation including aspiration, arrhythmia, respiratory failure and death), benefits  (restoration of normal sinus rhythm) and alternatives of a direct current cardioversion were explained in detail to Ms. Jarold MottoPatterson and she agrees to proceed.   Follow-up: Return to clinic in 3-4 weeks.  Yvonne Kendallhristopher Jonda Alanis, MD 10/19/2022 12:49 PM

## 2022-10-22 ENCOUNTER — Encounter: Payer: Self-pay | Admitting: Nurse Practitioner

## 2022-10-23 ENCOUNTER — Other Ambulatory Visit
Admission: RE | Admit: 2022-10-23 | Discharge: 2022-10-23 | Disposition: A | Discharge status: Home / Self Care | Payer: Medicare Other | Attending: Internal Medicine | Admitting: Internal Medicine

## 2022-10-23 DIAGNOSIS — I25118 Atherosclerotic heart disease of native coronary artery with other forms of angina pectoris: Secondary | ICD-10-CM

## 2022-10-23 DIAGNOSIS — E785 Hyperlipidemia, unspecified: Secondary | ICD-10-CM | POA: Insufficient documentation

## 2022-10-23 DIAGNOSIS — I4819 Other persistent atrial fibrillation: Secondary | ICD-10-CM

## 2022-10-23 DIAGNOSIS — E1169 Type 2 diabetes mellitus with other specified complication: Secondary | ICD-10-CM | POA: Diagnosis not present

## 2022-10-23 LAB — CBC
HCT: 44.5 % (ref 36.0–46.0)
Hemoglobin: 14.1 g/dL (ref 12.0–15.0)
MCH: 29.8 pg (ref 26.0–34.0)
MCHC: 31.7 g/dL (ref 30.0–36.0)
MCV: 94.1 fL (ref 80.0–100.0)
Platelets: 277 10*3/uL (ref 150–400)
RBC: 4.73 MIL/uL (ref 3.87–5.11)
RDW: 15.1 % (ref 11.5–15.5)
WBC: 11.2 10*3/uL — ABNORMAL HIGH (ref 4.0–10.5)
nRBC: 0 % (ref 0.0–0.2)

## 2022-10-23 LAB — BASIC METABOLIC PANEL
Anion gap: 8 (ref 5–15)
BUN: 17 mg/dL (ref 8–23)
CO2: 20 mmol/L — ABNORMAL LOW (ref 22–32)
Calcium: 8.4 mg/dL — ABNORMAL LOW (ref 8.9–10.3)
Chloride: 110 mmol/L (ref 98–111)
Creatinine, Ser: 1.24 mg/dL — ABNORMAL HIGH (ref 0.44–1.00)
GFR, Estimated: 45 mL/min — ABNORMAL LOW (ref >60)
Glucose, Bld: 235 mg/dL — ABNORMAL HIGH (ref 70–99)
Potassium: 4.3 mmol/L (ref 3.5–5.1)
Sodium: 138 mmol/L (ref 135–145)

## 2022-10-23 LAB — LIPID PANEL
Cholesterol: 103 mg/dL (ref 0–200)
HDL: 38 mg/dL — ABNORMAL LOW (ref >40)
LDL Cholesterol: 45 mg/dL (ref 0–99)
Total CHOL/HDL Ratio: 2.7 RATIO
Triglycerides: 98 mg/dL (ref <150)
VLDL: 20 mg/dL (ref 0–40)

## 2022-10-26 ENCOUNTER — Other Ambulatory Visit: Payer: Self-pay | Admitting: Internal Medicine

## 2022-10-30 ENCOUNTER — Encounter
Admission: RE | Disposition: A | Discharge status: Home / Self Care | Payer: Self-pay | Source: Home / Self Care | Attending: Internal Medicine

## 2022-10-30 ENCOUNTER — Encounter: Payer: Self-pay | Admitting: Internal Medicine

## 2022-10-30 ENCOUNTER — Ambulatory Visit
Admission: RE | Admit: 2022-10-30 | Discharge: 2022-10-30 | Disposition: A | Discharge status: Home / Self Care | Payer: Medicare Other | Attending: Internal Medicine | Admitting: Internal Medicine

## 2022-10-30 ENCOUNTER — Other Ambulatory Visit: Payer: Self-pay

## 2022-10-30 ENCOUNTER — Ambulatory Visit: Payer: Medicare Other | Admitting: Certified Registered Nurse Anesthetist

## 2022-10-30 DIAGNOSIS — Z9884 Bariatric surgery status: Secondary | ICD-10-CM | POA: Diagnosis not present

## 2022-10-30 DIAGNOSIS — I5032 Chronic diastolic (congestive) heart failure: Secondary | ICD-10-CM | POA: Diagnosis not present

## 2022-10-30 DIAGNOSIS — I25118 Atherosclerotic heart disease of native coronary artery with other forms of angina pectoris: Secondary | ICD-10-CM | POA: Diagnosis not present

## 2022-10-30 DIAGNOSIS — I4819 Other persistent atrial fibrillation: Secondary | ICD-10-CM | POA: Insufficient documentation

## 2022-10-30 DIAGNOSIS — Z833 Family history of diabetes mellitus: Secondary | ICD-10-CM | POA: Insufficient documentation

## 2022-10-30 DIAGNOSIS — Z8249 Family history of ischemic heart disease and other diseases of the circulatory system: Secondary | ICD-10-CM | POA: Diagnosis not present

## 2022-10-30 DIAGNOSIS — I5030 Unspecified diastolic (congestive) heart failure: Secondary | ICD-10-CM | POA: Diagnosis not present

## 2022-10-30 DIAGNOSIS — N183 Chronic kidney disease, stage 3 unspecified: Secondary | ICD-10-CM | POA: Insufficient documentation

## 2022-10-30 DIAGNOSIS — K219 Gastro-esophageal reflux disease without esophagitis: Secondary | ICD-10-CM | POA: Diagnosis not present

## 2022-10-30 DIAGNOSIS — I4891 Unspecified atrial fibrillation: Secondary | ICD-10-CM | POA: Diagnosis not present

## 2022-10-30 DIAGNOSIS — G4733 Obstructive sleep apnea (adult) (pediatric): Secondary | ICD-10-CM | POA: Insufficient documentation

## 2022-10-30 DIAGNOSIS — Z794 Long term (current) use of insulin: Secondary | ICD-10-CM | POA: Insufficient documentation

## 2022-10-30 DIAGNOSIS — E1169 Type 2 diabetes mellitus with other specified complication: Secondary | ICD-10-CM | POA: Diagnosis not present

## 2022-10-30 DIAGNOSIS — E1122 Type 2 diabetes mellitus with diabetic chronic kidney disease: Secondary | ICD-10-CM | POA: Diagnosis not present

## 2022-10-30 DIAGNOSIS — Z7901 Long term (current) use of anticoagulants: Secondary | ICD-10-CM | POA: Insufficient documentation

## 2022-10-30 DIAGNOSIS — Z7984 Long term (current) use of oral hypoglycemic drugs: Secondary | ICD-10-CM | POA: Insufficient documentation

## 2022-10-30 DIAGNOSIS — D631 Anemia in chronic kidney disease: Secondary | ICD-10-CM | POA: Diagnosis not present

## 2022-10-30 DIAGNOSIS — I130 Hypertensive heart and chronic kidney disease with heart failure and stage 1 through stage 4 chronic kidney disease, or unspecified chronic kidney disease: Secondary | ICD-10-CM | POA: Diagnosis not present

## 2022-10-30 DIAGNOSIS — E785 Hyperlipidemia, unspecified: Secondary | ICD-10-CM | POA: Diagnosis not present

## 2022-10-30 DIAGNOSIS — E1140 Type 2 diabetes mellitus with diabetic neuropathy, unspecified: Secondary | ICD-10-CM | POA: Diagnosis not present

## 2022-10-30 HISTORY — PX: CARDIOVERSION: SHX1299

## 2022-10-30 LAB — GLUCOSE, CAPILLARY: Glucose-Capillary: 136 mg/dL — ABNORMAL HIGH (ref 70–99)

## 2022-10-30 SURGERY — CARDIOVERSION
Anesthesia: General

## 2022-10-30 MED ORDER — PROPOFOL 10 MG/ML IV BOLUS
INTRAVENOUS | Status: DC | PRN
  Administered 2022-10-30: 40 mg via INTRAVENOUS

## 2022-10-30 MED ORDER — PROPOFOL 10 MG/ML IV BOLUS
INTRAVENOUS | Status: AC
  Filled 2022-10-30: qty 20

## 2022-10-30 MED ORDER — METOPROLOL SUCCINATE ER 25 MG PO TB24: 25.0000 mg | ORAL_TABLET | Freq: Every day | ORAL | 0 refills | Status: DC

## 2022-10-30 MED ORDER — SODIUM CHLORIDE 0.9 % IV SOLN: INTRAVENOUS | Status: DC

## 2022-10-30 NOTE — Anesthesia Preprocedure Evaluation (Signed)
Anesthesia Evaluation  Patient identified by MRN, date of birth, ID band Patient awake    History of Anesthesia Complications Negative for: history of anesthetic complications  Airway Mallampati: II       Dental  (+) Upper Dentures, Lower Dentures, Dental Advidsory Given   Pulmonary shortness of breath and with exertion, asthma , sleep apnea and Continuous Positive Airway Pressure Ventilation , neg recent URI    + decreased breath sounds      Cardiovascular Exercise Tolerance: Poor hypertension, Pt. on medications + angina (stable) with exertion + CAD and + DOE  (-) Past MI and (-) Cardiac Stents + dysrhythmias Atrial Fibrillation (-) Valvular Problems/Murmurs Rhythm:Irregular     Neuro/Psych  Headaches PSYCHIATRIC DISORDERS Anxiety Depression     Neuromuscular disease    GI/Hepatic Neg liver ROS,GERD  ,,  Endo/Other  diabetes, Poorly Controlled, Type 1  Morbid obesity  Renal/GU CRFRenal disease  negative genitourinary   Musculoskeletal   Abdominal  (+) + obese  Peds negative pediatric ROS (+)  Hematology negative hematology ROS (+)   Anesthesia Other Findings Past Medical History: No date: (HFpEF) heart failure with preserved ejection fraction (HCC)     Comment:  a. 12/2019 Echo: EF 65-70%, Gr2 DD. No significant               valvular dzs. 03/25/2013: Abnormality of gait No date: Adrenal mass, left (HCC) No date: Anginal pain (HCC) No date: Anxiety No date: Aortic atherosclerosis (HCC) No date: Arthritis No date: Asthma No date: CAD (coronary artery disease) with h/o Atypical Chest Pain     Comment:  a. 04/1986 Cath ([facility]): nl cors, EF 65%; b. 07/2012 Cath               [facility]): Diff minor irregs; c. 07/2016 MV [facility]):               "Equivocal"; d. 08/2016 Cardiac CT Ca2+ score [facility]): Ca2+              1548; e. 05/2019 MV: No ischemia. EF 75%; f. 12/2019 PCI:               LM nl, LAD 65ost (3.5x12 Resolute DES),  64m (2.75x12               Resolute DES),LCX/RCA nl; g. 08/2020 Cath: patent LAD               stents, RCA 40ost, elev RH pressures, EF >65%. 1994: Cervical spinal stenosis     Comment:  due to trauma to back ([employer] accident), has               intermittent paralysis and parasthesias 03/23/2014: Cervicogenic headache No date: CKD (chronic kidney disease), stage III (HCC) No date: Depression No date: Diverticulosis No date: Dizziness     Comment:  a.) chronic No date: DJD (degenerative joint disease)     Comment:  a. Chronic R shoulder pain No date: Dyspnea 09/2009: Esophageal stenosis     Comment:  a.) transient outlet obstruction by food, cleared by EGD No date: Family history of adverse reaction to anesthesia     Comment:  a.) daughter with (+) PONV No date: Gastric bypass status for obesity No date: GERD (gastroesophageal reflux disease) No date: Headache(784.0) No date: HLD (hyperlipidemia) No date: Hypertension No date: IBS (irritable bowel syndrome) No date: IDA (iron deficiency anemia)     Comment:  a.) post 2 unit txfsn 2009, normal endo/colonoscopy by  [facility] No date: Left bundle branch block (LBBB)     Comment:  a.) Intermittently present - likely rate related. 11/15/2020: NSVT (nonsustained ventricular tachycardia)     Comment:  a.) Holter study 11/15/2020; 11 episodes of NSVT lasting              up to 8 beats with a maximum rate of 210 bpm; 14 atrial               runs lasting up to 18 beats with a rate of up to 250 bpm.              Some atrial runs felt to be NSVT. No date: Obesity No date: OSA on CPAP 03/25/2013: Polyneuropathy in diabetes(357.2) 03/09/2021: Postherpetic neuralgia     Comment:  Right V1 distribution No date: Restless leg syndrome 08/13/2017: Rotator cuff arthropathy, right 03/12/2014: Syncope and collapse No date: Type II diabetes mellitus (HCC)   Reproductive/Obstetrics negative OB ROS                              Anesthesia Physical Anesthesia Plan  ASA: 4  Anesthesia Plan: General   Post-op Pain Management: Minimal or no pain anticipated   Induction: Intravenous  PONV Risk Score and Plan: 3 and Propofol infusion and TIVA  Airway Management Planned: Nasal Cannula and Natural Airway  Additional Equipment:   Intra-op Plan:   Post-operative Plan:   Informed Consent: I have reviewed the patients History and Physical, chart, labs and discussed the procedure including the risks, benefits and alternatives for the proposed anesthesia with the patient or authorized representative who has indicated his/her understanding and acceptance.       Plan Discussed with: CRNA and Surgeon  Anesthesia Plan Comments:         Anesthesia Quick Evaluation

## 2022-10-30 NOTE — Addendum Note (Signed)
Addended by: Nicholes Rough on: 10/30/2022 12:06 PM   Modules accepted: Level of Service

## 2022-10-30 NOTE — CV Procedure (Signed)
    Cardioversion Note  TYERA EATHERLY [id number] Dec 23, 1945  Procedure: DC Cardioversion Indications: Persistent atrial fibrillation  Procedure Details Consent: Obtained Time Out: Verified patient identification, verified procedure, site/side was marked, verified correct patient position, special equipment/implants available, Radiology Safety Procedures followed,  medications/allergies/relevent history reviewed, required imaging and test results available.  Performed  The patient has been on adequate anticoagulation.  The patient received IV propofol for sedation.  Synchronous cardioversion was performed at 200 joules x 1.  The cardioversion was successful with restoration of sinus bradycardia.  Complications: No apparent complications Patient did tolerate procedure well.  Recommendations: -Continue apixaban 5 mg BID. -Discontinue metoprolol. -Follow-up as scheduled on 11/27/2022.  [name]., MD 10/30/2022, 7:44 AM

## 2022-10-30 NOTE — Anesthesia Procedure Notes (Signed)
Date/Time: 10/30/2022 7:34 AM  Performed by: Joanette Gula, Kanoelani Dobies, CRNAPre-anesthesia Checklist: Patient identified, Emergency Drugs available, Suction available, Patient being monitored and Timeout performed Patient Re-evaluated:Patient Re-evaluated prior to induction Oxygen Delivery Method: Nasal cannula Induction Type: IV induction

## 2022-10-30 NOTE — Transfer of Care (Signed)
Immediate Anesthesia Transfer of Care Note  Patient: Denise Sharp  Procedure(s) Performed: CARDIOVERSION  Patient Location: Nursing Unit  Anesthesia Type:General  Level of Consciousness: awake, alert , and oriented  Airway & Oxygen Therapy: Patient Spontanous Breathing and Patient connected to nasal cannula oxygen  Post-op Assessment: Report given to RN and Post -op Vital signs reviewed and stable  Post vital signs: Reviewed and stable  Last Vitals:  Vitals Value Taken Time  BP 117/71 10/30/22 0733  Temp    Pulse 84 10/30/22 0733  Resp 14 10/30/22 0733  SpO2 100 % 10/30/22 0733  Vitals shown include unvalidated device data.  Last Pain:  Vitals:   10/30/22 0659  TempSrc: Oral  PainSc: 0-No pain         Complications: No notable events documented.

## 2022-10-30 NOTE — Interval H&P Note (Signed)
History and Physical Interval Note:  10/30/2022 7:33 AM  Denise Sharp  has presented today for surgery, with the diagnosis of atrial fibrillation.  The various methods of treatment have been discussed with the patient and family. After consideration of risks, benefits and other options for treatment, the patient has consented to  Procedure(s): CARDIOVERSION (N/A) as a surgical intervention.  The patient's history has been reviewed, patient examined, no change in status, stable for surgery.  I have reviewed the patient's chart and labs.  Questions were answered to the patient's satisfaction.     Jacky Dross

## 2022-10-30 NOTE — Anesthesia Postprocedure Evaluation (Signed)
Anesthesia Post Note  Patient: Denise Sharp  Procedure(s) Performed: CARDIOVERSION  Patient location during evaluation: Specials Recovery Anesthesia Type: General Level of consciousness: awake and alert Pain management: pain level controlled Vital Signs Assessment: post-procedure vital signs reviewed and stable Respiratory status: spontaneous breathing, nonlabored ventilation, respiratory function stable and patient connected to nasal cannula oxygen Cardiovascular status: blood pressure returned to baseline and stable Postop Assessment: no apparent nausea or vomiting Anesthetic complications: no   No notable events documented.   Last Vitals:  Vitals:   10/30/22 0800 10/30/22 0815  BP: (!) 82/60 91/64  Pulse: (!) 50 (!) 58  Resp: 13 15  Temp:    SpO2: 98% 97%    Last Pain:  Vitals:   10/30/22 0815  TempSrc:   PainSc: 0-No pain                 Lenard Simmer

## 2022-10-31 ENCOUNTER — Encounter: Payer: Self-pay | Admitting: Internal Medicine

## 2022-11-12 ENCOUNTER — Other Ambulatory Visit (HOSPITAL_COMMUNITY): Payer: Self-pay

## 2022-11-12 ENCOUNTER — Other Ambulatory Visit: Payer: Self-pay | Admitting: Internal Medicine

## 2022-11-12 ENCOUNTER — Other Ambulatory Visit: Payer: Medicare Other | Admitting: Pharmacist

## 2022-11-12 DIAGNOSIS — E1142 Type 2 diabetes mellitus with diabetic polyneuropathy: Secondary | ICD-10-CM

## 2022-11-12 MED ORDER — NOVOLOG FLEXPEN 100 UNIT/ML ~~LOC~~ SOPN: PEN_INJECTOR | SUBCUTANEOUS | 11 refills | Status: DC

## 2022-11-12 MED ORDER — TRESIBA FLEXTOUCH 100 UNIT/ML ~~LOC~~ SOPN
12.0000 [IU] | PEN_INJECTOR | Freq: Every day | SUBCUTANEOUS | 3 refills | Status: DC
Start: 2022-11-12 — End: 2023-06-24

## 2022-11-12 MED ORDER — ATORVASTATIN CALCIUM 80 MG PO TABS: 80.0000 mg | ORAL_TABLET | Freq: Every day | ORAL | 1 refills | Status: DC

## 2022-11-12 MED ORDER — DAPAGLIFLOZIN PROPANEDIOL 10 MG PO TABS
10.0000 mg | ORAL_TABLET | Freq: Every day | ORAL | 3 refills | Status: DC
Start: 2022-11-12 — End: 2023-09-30

## 2022-11-12 NOTE — Patient Instructions (Signed)
Denise Sharp,   It was great talking with you today!  Continue your current regimen. Remember to take your Novolog about 10 minutes before you start eating.   Take care,   Catie Eppie Gibson, PharmD, BCACP, CPP [facility] [phone]

## 2022-11-12 NOTE — Progress Notes (Signed)
11/12/2022 Name: CHENAE BRAGER MRN: [id number] DOB: 07/19/1945  Chief Complaint  Patient presents with   Medication Management   Diabetes   Hypertension   Hyperlipidemia    KEALA DRUM is a 77 y.o. year old female who presented for a telephone visit.   They were referred to the pharmacist by their PCP for assistance in managing diabetes.   Subjective:  Care Team: Primary Care Provider: Sherlene Shams, MD ; Next Scheduled Visit: not scheduled Cardiologist: Fransico Michael; Next Scheduled Visit: 11/27/22 Pulmonologist: Craige Cotta; Next Scheduled Visit: 11/14/22 Hematology/Oncology: Kizzie Bane; Next Scheduled Visit: 11/23/22  Medication Access/Adherence  Current Pharmacy:  [id number] [place], [place] - [address] [place] [address] Phone: [phone] Fax: [phone]  [facility] - [place], [place] - [address] [facility] [address] [place] [address] [place] [address] Phone: [phone] Fax: [phone]   Patient reports affordability concerns with their medications: No  Patient reports access/transportation concerns to their pharmacy: No  Patient reports adherence concerns with their medications:  No     Diabetes:  Current medications: Farxiga 10 mg daily Tresiba 12 units daily; Novolog per sliding scale (Inject 4-16 units with meals per sliding scale: If pre-meal glucose <100, inject 4 units; 100-150 - inject 6 units, 151-200 - inject 8 units; 201-250 - inject 10 units; 251-300 - inject 12 units, 301-350 - inject 14 units ; 350 > inject 16 units);  Medications tried in the past: Previous medications: metformin (GI upset, refuses to retry); avoid pioglitazone due to HF; GI upset with Ozempic and Trulicity   Reports she never received Fiasp  Date of Download: 11/12/22 % Time CGM is active: 68% Average Glucose: 173 mg/dL Glucose Management Indicator: 7.4%  Glucose Variability: 34.8 (goal <36%) Time in Goal:  - Time in range 70-180:  68% - Time above range: 32% - Time below range: 0% Observed patterns: continues to have post-prandial elevations periodically, but better controlled.          Patient denies hypoglycemic s/sx including dizziness, shakiness, sweating.   Reports now that she is feeling better after cardioversion, she plans to be more active as her strength returns. Did not discuss dietary choices today   Hyperlipidemia/ASCVD Risk Reduction  Current lipid lowering medications: atorvastatin 80 mg daily  Antiplatelet regimen: none due to DOAC  Heart Failure:  Current medications:  ACEi/ARB/ARNI: previously on, limited by hypotension SGLT2i: Farxiga 10 mg daily Beta blocker: none after cardioversion Mineralocorticoid Receptor Antagonist: none, stopped due to renal function Diuretic regimen: none  Atrial Fibrillation:  Current medications: Rate Control: none Rhythm Control: none Anticoagulation Regimen: Eliquis 5 mg twice daily    Objective:  Lab Results  Component Value Date   HGBA1C 7.1 (H) 07/13/2022    Lab Results  Component Value Date   CREATININE 1.24 (H) 10/23/2022   BUN 17 10/23/2022   NA 138 10/23/2022   K 4.3 10/23/2022   CL 110 10/23/2022   CO2 20 (L) 10/23/2022    Lab Results  Component Value Date   CHOL 103 10/23/2022   HDL 38 (L) 10/23/2022   LDLCALC 45 10/23/2022   LDLDIRECT 126 (H) 07/13/2022   TRIG 98 10/23/2022   CHOLHDL 2.7 10/23/2022    Medications Reviewed Today     Reviewed by Alden Hipp, RPH-CPP (Pharmacist) on 11/12/22 at 1352  Med List Status: <None>   Medication Order Taking? Sig Documenting Provider Last Dose Status Informant  albuterol (VENTOLIN HFA) 108 (90 Base)  MCG/ACT inhaler [id number]  Inhale 1-2 puffs into the lungs every 6 (six) hours as needed for wheezing or shortness of breath. McLean-Scocuzza, Pasty Spillers, MD  Active Self  apixaban (ELIQUIS) 5 MG TABS tablet [id number]  Take 1 tablet (5 mg total) by mouth 2 (two) times  daily. Lot # [id number] Exp. 5/25 End, Cristal Deer, MD  Active   atorvastatin (LIPITOR) 80 MG tablet [id number] Yes Take 1 tablet (80 mg total) by mouth daily. Sherlene Shams, MD Taking Active   benzonatate (TESSALON) 200 MG capsule [id number]  Take 1 capsule (200 mg total) by mouth 2 (two) times daily as needed for cough. Sherlene Shams, MD  Active   butalbital-acetaminophen-caffeine (FIORICET) 50-325-40 MG tablet [id number]  TAKE 1 TABLET BY MOUTH EVERY 6 HOURS AS NEEDED FOR HEADACHE. Sherlene Shams, MD  Active   Cholecalciferol (VITAMIN D3) 250 MCG (10000 UT) capsule [id number]  Take 10,000 Units by mouth daily. [provider]  Active Self  clotrimazole-betamethasone (LOTRISONE) cream [id number]  Apply 1 application topically 2 (two) times daily as needed (irritation). [provider]  Active Self  conjugated estrogens (PREMARIN) vaginal cream [id number]  Apply one pea-sized amount around the opening of the urethra daily for 2 weeks, then 3 times weekly moving forward.  Patient taking differently: Apply one pea-sized amount around the opening of the urethra  3 times weekly   Carman Ching, PA-C  Active Self  Continuous Blood Gluc Receiver (FREESTYLE LIBRE 2 READER) DEVI [id number] Yes Use to check sugar at least TID Sherlene Shams, MD Taking Active Self  cyanocobalamin (,VITAMIN B-12,) 1000 MCG/ML injection [id number]  Inject 1 mL (1,000 mcg total) into the muscle every 30 (thirty) days. Inject 1 ml (1000 mcg ) IM weekly x 4,  Then monthly thereafter Sherlene Shams, MD  Active Self  dapagliflozin propanediol (FARXIGA) 10 MG TABS tablet [id number] Yes Take 1 tablet (10 mg total) by mouth daily. Sherlene Shams, MD Taking Active   dicyclomine (BENTYL) 20 MG tablet [id number]  Take 1 tablet (20 mg total) by mouth 3 (three) times daily before meals. [name], MD  Active   DULoxetine (CYMBALTA) 30 MG capsule [id number]  Take 1 capsule (30 mg total) by mouth daily. Glean Salvo,  NP  Active   fexofenadine [facility]) 180 MG tablet [id number]  Take 180 mg by mouth as needed for allergies or rhinitis. [provider]  Active Self  fluticasone (FLONASE) 50 MCG/ACT nasal spray [id number]  Place 2 sprays into both nostrils daily.  Patient taking differently: Place 2 sprays into both nostrils 3 (three) times daily as needed for allergies.   Glori Luis, MD  Active Self  Glucagon (GVOKE HYPOPEN 2-PACK) 0.5 MG/0.1ML Ivory Broad [id number]  Inject 1 auto injector subcutaneously as needed for severe hypoglycemia. May repeat x 1 after 15 minutes if needed. Bethanie Dicker, NP  Active   glucose blood [facility] VERIO) test strip [id number]  Use to check blood sugar twice daily. Sherlene Shams, MD  Active Self  insulin aspart (FIASP FLEXTOUCH) 100 UNIT/ML FlexTouch Pen [id number]  Inject 4-14 Units into the skin 3 (three) times daily. Inject 4-16 units with meals per sliding scale: If pre-meal glucose <100, inject 4 units; 100-150 - inject 6 units, 151-200 - inject 8 units; 201-250 - inject 10 units; 251-300 - inject 12 units, 301-350 - inject 14 units ; 350 > inject 16 units Tullo, Mar Daring, MD  Active   insulin degludec (TRESIBA FLEXTOUCH) 100 UNIT/ML FlexTouch  Pen [id number]  Inject 12 Units into the skin daily. Sherlene Shams, MD  Active   Insulin Pen Needle (DROPLET PEN NEEDLES) 31G X 5 MM MISC [id number]  USE ONE NEEDLE SUBCUTANEOUSLY AS DIRECTED (REMOVE AND DISCARD NEEDLE IN SHARPS CONTAINER IMMEDIATELY AFTER USE) Sherlene Shams, MD  Active Self  loperamide (IMODIUM) 2 MG capsule [id number]  Take 4-6 mg by mouth 3 (three) times daily before meals. [provider]  Active Self  magnesium gluconate (MAGONATE) 500 MG tablet [id number]  Take 500 mg by mouth daily. [provider]  Active Self  nitroGLYCERIN (NITROSTAT) 0.4 MG SL tablet [id number]  Place 1 tablet (0.4 mg total) under the tongue every 5 (five) minutes as needed for chest pain. End, Cristal Deer, MD  Active    nystatin (MYCOSTATIN/NYSTOP) powder [id number]  Apply 1 g topically 4 (four) times daily as needed (rash). [provider]  Active Self  ondansetron (ZOFRAN-ODT) 4 MG disintegrating tablet [id number]  Take 1 tablet (4 mg total) by mouth every 8 (eight) hours as needed for nausea or vomiting. Sherlene Shams, MD  Active   OneTouch Delica Lancets 33G [place] [id number]  Used to check blood sugar two times a day. Sherlene Shams, MD  Active Self  OVER THE COUNTER MEDICATION [id number]  Apply 1 application  topically daily as needed (pain). [place] Gel topical pain reliever [provider]  Active Self  pantoprazole (PROTONIX) 40 MG tablet [id number]  Take 1 tablet (40 mg total) by mouth daily. [name], MD  Active   Polyethyl Glycol-Propyl Glycol [facility]) [id number]  Place 1 drop into both eyes 4 (four) times daily as needed (dry eyes). [provider]  Active Self  pregabalin (LYRICA) 50 MG capsule [id number]  TAKE 1 CAPSULE BY MOUTH TWICE A DAY Sherlene Shams, MD  Active   Med List Note Haywood Pao, [place] 07/22/17 1307): CPAP              Assessment/Plan:   Diabetes: - Currently uncontrolled but improving - Reviewed long term cardiovascular and renal outcomes of uncontrolled blood sugar - Reviewed goal A1c, goal fasting, and goal 2 hour post prandial glucose - Contacted [facility]. They do not carry Fiasp. Copay at a local pharmacy will be $141. Patient elects to continue current regimen at this time. Patient does have a greater bolus:basal ratio, but fastings appear to be well controlled. Reviewed importance of taking mealtime insulin ~10 minutes prior to starting meal. I believe some elevations are because she is going out to eat and she notes that she waits until the food arrives before taking Novolog.  - Recommend to check glucose continuously using CGM - Assisted in scheduling follow up with PCP. Endocrinology on 5/23.   Hyperlipidemia/ASCVD Risk  Reduction: - Currently controlled.  - Recommend to continue current regimen at this time.    Heart Failure: - Currently appropriately managed - Recommend to continue current regimen at this time. Continue to follow with cardiology.    Atrial Fibrillation: - Currently controlled - Recommend to continue current regimen at this time   Follow Up Plan: pharmacy needs met, follow up with PCP as scheduled today.   Catie Eppie Gibson, PharmD, BCACP, CPP [facility] [phone]

## 2022-11-12 NOTE — Addendum Note (Signed)
Addended by: Sandy Salaam on: 11/12/2022 04:23 PM   Modules accepted: Orders

## 2022-11-12 NOTE — Telephone Encounter (Signed)
Pt is completely out of Lyrica and would like to get [facility] and also have a refill sent to [facility] mail order pharmacy.

## 2022-11-12 NOTE — Telephone Encounter (Signed)
Prescription Request  11/12/2022  LOV: 07/13/2022  What is the name of the medication or equipment? pregabalin (LYRICA) 50 MG capsule,. Patient is out of medication and [facility] Also a another refill after that sent to Mail order pharmacy.   Have you contacted your pharmacy to request a refill? Yes   Which pharmacy would you like this sent to?  [id number] [place], [place] - [address] [place] [address] Phone: [phone] Fax: [phone]  [facility] - [place], [place] - [address] [facility] [address] [place] [address] [place] [address] Phone: [phone] Fax: [phone]    Patient notified that their request is being sent to the clinical staff for review and that they should receive a response within 2 business days.   Please advise at [facility] [phone]

## 2022-11-13 ENCOUNTER — Encounter: Payer: Self-pay | Admitting: Internal Medicine

## 2022-11-13 ENCOUNTER — Other Ambulatory Visit: Payer: Self-pay | Admitting: Internal Medicine

## 2022-11-13 MED ORDER — PREGABALIN 50 MG PO CAPS: 50.0000 mg | ORAL_CAPSULE | Freq: Two times a day (BID) | ORAL | 2 refills | Status: DC

## 2022-11-14 ENCOUNTER — Ambulatory Visit (INDEPENDENT_AMBULATORY_CARE_PROVIDER_SITE_OTHER): Payer: Medicare Other | Admitting: Pulmonary Disease

## 2022-11-14 ENCOUNTER — Encounter: Payer: Self-pay | Admitting: Pulmonary Disease

## 2022-11-14 VITALS — BP 116/78 | HR 67 | Temp 97.7°F | Ht 64.5 in | Wt 244.0 lb

## 2022-11-14 DIAGNOSIS — G4733 Obstructive sleep apnea (adult) (pediatric): Secondary | ICD-10-CM

## 2022-11-14 NOTE — Progress Notes (Signed)
[place] [facility], Critical Care, and Sleep Medicine  Chief Complaint  Patient presents with   Follow-up    CPAP doing good. No mask or pressure problems.    Past Surgical History:  She  has a past surgical history that includes Gastric bypass (2000, 2005); Appendectomy; Rotator cuff repair (Right); Total knee arthroplasty (Bilateral, 2007); Spine surgery (1995); Interstim Implant placement (10/2011); Total abdominal hysterectomy w/ bilateral salpingoophorectomy (1974); Umbilical hernia repair (08/11/2015); Diaphragmatic hernia repair (2015); Colonoscopy; Esophageal dilation; Reverse shoulder arthroplasty (Right, 08/13/2017); Panniculectomy (N/A, 06/16/2019); LEFT HEART CATH AND CORS/GRAFTS ANGIOGRAPHY (N/A, 12/31/2019); CORONARY STENT INTERVENTION (N/A, 01/04/2020); LEFT HEART CATH AND CORONARY ANGIOGRAPHY (N/A, 04/1986); RIGHT/LEFT HEART CATH AND CORONARY ANGIOGRAPHY (N/A, 09/06/2020); Esophagogastroduodenoscopy (egd) with propofol (N/A, 05/11/2021); Coronary angioplasty; Back surgery; Esophagogastroduodenoscopy (egd) with propofol (N/A, 06/13/2021); Cholecystectomy (N/A, 1976); Eye surgery; LEFT HEART CATH AND CORONARY ANGIOGRAPHY (Left, 07/24/2012); Interstim Implant removal (N/A, 09/18/2021); Interstim Implant placement (N/A, 09/18/2021); Interstim Implant placement (N/A, 09/18/2021); Tooth extraction (11/13/2021); RIGHT/LEFT HEART CATH AND CORONARY ANGIOGRAPHY (N/A, 06/04/2022); and Cardioversion (N/A, 10/30/2022).  Past Medical History:  HFpEF, Anxiety, OA, CAD, Cervical spinal stenosis, Headache, CKD 3, Depression, Diverticulosis, Dizziness with unsteady gait, Esophageal stenosis, s/p Gastric bypass, GERD, HLD, HTN, IBS, IDA, LBBB, NSVT, DM type 2, Neuropathy, Postherpetic neuralgia  Constitutional:  BP 116/78 (BP Location: Left Arm, Cuff Size: Large)   Pulse 67   Temp 97.7 F (36.5 C)   Ht 5' 4.5" (1.638 m)   Wt 244 lb (110.7 kg)   SpO2 96%   BMI 41.24 kg/m   Brief Summary:   Denise Sharp is a 77 y.o. female with obstructive sleep apnea.      Subjective:   She is here with her husband.  Previously seen by Denise Sharp.  She uses CPAP nightly.  No issues with mask fit.  Uses nasal pillow mask.  Still feels tired during the day.  Gets winded with minimal activity.  Has trouble with dizziness and neuropathy.  Also back and joint pain make it difficult for her to be active.  She was going to the pool for exercise, but had a fall after getting hypoglycemic and was told she couldn't use the pool anymore.  She recently had cardioversion and was told it can take time for her body to adjust to normal heart rate and rhythm.  Physical Exam:   Appearance - well kempt   ENMT - no sinus tenderness, no oral exudate, no LAN, Mallampati 3 airway, no stridor  Respiratory - equal breath sounds bilaterally, no wheezing or rales  CV - s1s2 regular rate and rhythm, no murmurs  Ext - no clubbing, no edema  Skin - no rashes  Psych - normal mood and affect   Pulmonary testing:    Chest Imaging:    Sleep Tests:  PSG 08/14/12 >> AHI 13.2, SpO2 low 85% Auto CPAP 10/14/22 to 11/12/22 >> used on 30 of 30 nights with average 7 hrs 33 min.  Average AHI 0.8 with median CPAP 7 and 95 th percentile CPAP 10 cm H2O  Cardiac Tests:  Echo 12/30/19 >> EF 65 to 70%, grade 2 DD  Social History:  She  reports that she has never smoked. She has never been exposed to tobacco smoke. She has never used smokeless tobacco. She reports that she does not currently use drugs. She reports that she does not drink alcohol.  Family History:  Her family history includes Depression in her mother; Diabetes in her daughter; Headache in  her mother; Heart disease in her daughter, father, and mother; Hypertension in her daughter, father, mother, and son; Osteoporosis in her father; Prostate cancer in her father; Stroke in her father and mother; Thyroid disease in her mother.      Assessment/Plan:   Obstructive sleep apnea. - she is compliant with CPAP and reports benefit from therapy - she uses [employer] for her DME - current CPAP ordered April 2019 - continue auto CPAP 4 to 15 cm H2O  Dyspnea on exertion. - likely from diastolic CHF and deconditioning - discussed importance of maintaining a regular exercise regimen as tolerated  Persistent atrial fibrillation. - followed by Denise Sharp with cardiology   Time Spent Involved in Patient Care on Day of Examination:  38 minutes  Follow up:   Patient Instructions  Follow up in 4 months  Medication List:   Allergies as of 11/14/2022       Reactions   Demeclocycline Hives   Erythromycin Nausea And Vomiting, Other (See Comments)   Severe irritable bowel   Flagyl [metronidazole] Nausea And Vomiting, Other (See Comments)   Severe irritable bowel   Glucophage [metformin Hcl] Nausea And Vomiting, Other (See Comments)   "Sick" "I won't take anything that has metformin in it"   Tetracyclines & Related Hives, Rash   Furosemide Swelling, Other (See Comments)   Other reaction(s): Localized superficial swelling of skin Has opposite effect   Tetracycline Other (See Comments)   Diovan [valsartan] Nausea Only       Sulfa Antibiotics Other (See Comments), Rash   As child   Xanax [alprazolam] Other (See Comments)   Hyperactivity         Medication List        Accurate as of Nov 14, 2022 11:25 AM. If you have any questions, ask your nurse or doctor.          albuterol 108 (90 Base) MCG/ACT inhaler Commonly known as: VENTOLIN HFA Inhale 1-2 puffs into the lungs every 6 (six) hours as needed for wheezing or shortness of breath.   apixaban 5 MG Tabs tablet Commonly known as: ELIQUIS Take 1 tablet (5 mg total) by mouth 2 (two) times daily. Lot # [id number] Exp. 5/25   atorvastatin 80 MG tablet Commonly known as: LIPITOR Take 1 tablet (80 mg total) by mouth daily.   benzonatate 200 MG  capsule Commonly known as: TESSALON Take 1 capsule (200 mg total) by mouth 2 (two) times daily as needed for cough.   butalbital-acetaminophen-caffeine 50-325-40 MG tablet Commonly known as: FIORICET TAKE 1 TABLET BY MOUTH EVERY 6 HOURS AS NEEDED FOR HEADACHE.   clotrimazole-betamethasone cream Commonly known as: LOTRISONE Apply 1 application topically 2 (two) times daily as needed (irritation).   cyanocobalamin 1000 MCG/ML injection Commonly known as: VITAMIN B12 Inject 1 mL (1,000 mcg total) into the muscle every 30 (thirty) days. Inject 1 ml (1000 mcg ) IM weekly x 4,  Then monthly thereafter   dapagliflozin propanediol 10 MG Tabs tablet Commonly known as: FARXIGA Take 1 tablet (10 mg total) by mouth daily.   dicyclomine 20 MG tablet Commonly known as: BENTYL Take 1 tablet (20 mg total) by mouth 3 (three) times daily before meals.   Droplet Pen Needles 31G X 5 MM Misc Generic drug: Insulin Pen Needle USE ONE NEEDLE SUBCUTANEOUSLY AS DIRECTED (REMOVE AND DISCARD NEEDLE IN SHARPS CONTAINER IMMEDIATELY AFTER USE)   DULoxetine 30 MG capsule Commonly known as: Cymbalta Take 1 capsule (30 mg total) by mouth  daily.   fexofenadine 180 MG tablet Commonly known as: ALLEGRA Take 180 mg by mouth as needed for allergies or rhinitis.   fluticasone 50 MCG/ACT nasal spray Commonly known as: FLONASE Place 2 sprays into both nostrils daily. What changed:  when to take this reasons to take this   FreeStyle Libre 2 Reader Hardie Pulley Use to check sugar at least TID   Gvoke HypoPen 2-Pack 0.5 MG/0.1ML Soaj Generic drug: Glucagon Inject 1 auto injector subcutaneously as needed for severe hypoglycemia. May repeat x 1 after 15 minutes if needed.   loperamide 2 MG capsule Commonly known as: IMODIUM Take 4-6 mg by mouth 3 (three) times daily before meals.   magnesium gluconate 500 MG tablet Commonly known as: MAGONATE Take 500 mg by mouth daily.   nitroGLYCERIN 0.4 MG SL tablet Commonly  known as: NITROSTAT Place 1 tablet (0.4 mg total) under the tongue every 5 (five) minutes as needed for chest pain.   NovoLOG FlexPen 100 UNIT/ML FlexPen Generic drug: insulin aspart (Inject 4-16 units with meals per sliding scale; max daily dose 36 units) If pre-meal glucose <100, inject 4 units; 100-150 - inject 6 units, 151-200 - inject 8 units; 201-250 - inject 10 units; 251-300 - inject 12 units, 301-350 - inject 14 units ; 350 > inject 16 units);   nystatin powder Commonly known as: MYCOSTATIN/NYSTOP Apply 1 g topically 4 (four) times daily as needed (rash).   ondansetron 4 MG disintegrating tablet Commonly known as: ZOFRAN-ODT Take 1 tablet (4 mg total) by mouth every 8 (eight) hours as needed for nausea or vomiting.   OneTouch Delica Lancets 33G Misc Used to check blood sugar two times a day.   OneTouch Verio test strip Generic drug: glucose blood Use to check blood sugar twice daily.   OVER THE COUNTER MEDICATION Apply 1 application  topically daily as needed (pain). [place] Gel topical pain reliever   pantoprazole 40 MG tablet Commonly known as: PROTONIX Take 1 tablet (40 mg total) by mouth daily.   pregabalin 50 MG capsule Commonly known as: LYRICA Take 1 capsule (50 mg total) by mouth 2 (two) times daily.   Premarin vaginal cream Generic drug: conjugated estrogens Apply one pea-sized amount around the opening of the urethra daily for 2 weeks, then 3 times weekly moving forward. What changed: additional instructions   SYSTANE OP Place 1 drop into both eyes 4 (four) times daily as needed (dry eyes).   Evaristo Bury FlexTouch 100 UNIT/ML FlexTouch Pen Generic drug: insulin degludec Inject 12 Units into the skin daily.   Vitamin D3 250 MCG (10000 UT) capsule Take 10,000 Units by mouth daily.        Signature:  Coralyn Helling, MD [facility] Pulmonary/Critical Care Pager - [phone] 11/14/2022, 11:25 AM

## 2022-11-14 NOTE — Patient Instructions (Signed)
Follow up in 4 months 

## 2022-11-23 ENCOUNTER — Inpatient Hospital Stay: Payer: Medicare Other

## 2022-11-23 ENCOUNTER — Encounter: Payer: Self-pay | Admitting: Medical Oncology

## 2022-11-23 ENCOUNTER — Inpatient Hospital Stay: Payer: Medicare Other | Attending: Internal Medicine | Admitting: Medical Oncology

## 2022-11-23 VITALS — BP 130/61 | HR 85 | Temp 98.7°F | Wt 244.0 lb

## 2022-11-23 DIAGNOSIS — E1142 Type 2 diabetes mellitus with diabetic polyneuropathy: Secondary | ICD-10-CM | POA: Diagnosis not present

## 2022-11-23 DIAGNOSIS — I5032 Chronic diastolic (congestive) heart failure: Secondary | ICD-10-CM | POA: Diagnosis not present

## 2022-11-23 DIAGNOSIS — I471 Supraventricular tachycardia, unspecified: Secondary | ICD-10-CM | POA: Diagnosis not present

## 2022-11-23 DIAGNOSIS — Z7901 Long term (current) use of anticoagulants: Secondary | ICD-10-CM | POA: Insufficient documentation

## 2022-11-23 DIAGNOSIS — I130 Hypertensive heart and chronic kidney disease with heart failure and stage 1 through stage 4 chronic kidney disease, or unspecified chronic kidney disease: Secondary | ICD-10-CM | POA: Diagnosis not present

## 2022-11-23 DIAGNOSIS — K909 Intestinal malabsorption, unspecified: Secondary | ICD-10-CM | POA: Diagnosis not present

## 2022-11-23 DIAGNOSIS — Z9884 Bariatric surgery status: Secondary | ICD-10-CM | POA: Diagnosis not present

## 2022-11-23 DIAGNOSIS — E1165 Type 2 diabetes mellitus with hyperglycemia: Secondary | ICD-10-CM | POA: Insufficient documentation

## 2022-11-23 DIAGNOSIS — Z79899 Other long term (current) drug therapy: Secondary | ICD-10-CM | POA: Diagnosis not present

## 2022-11-23 DIAGNOSIS — E611 Iron deficiency: Secondary | ICD-10-CM

## 2022-11-23 DIAGNOSIS — I509 Heart failure, unspecified: Secondary | ICD-10-CM | POA: Diagnosis not present

## 2022-11-23 DIAGNOSIS — I2510 Atherosclerotic heart disease of native coronary artery without angina pectoris: Secondary | ICD-10-CM | POA: Insufficient documentation

## 2022-11-23 DIAGNOSIS — N183 Chronic kidney disease, stage 3 unspecified: Secondary | ICD-10-CM | POA: Insufficient documentation

## 2022-11-23 DIAGNOSIS — G8929 Other chronic pain: Secondary | ICD-10-CM | POA: Insufficient documentation

## 2022-11-23 DIAGNOSIS — D509 Iron deficiency anemia, unspecified: Secondary | ICD-10-CM | POA: Insufficient documentation

## 2022-11-23 DIAGNOSIS — Z794 Long term (current) use of insulin: Secondary | ICD-10-CM | POA: Insufficient documentation

## 2022-11-23 DIAGNOSIS — J45909 Unspecified asthma, uncomplicated: Secondary | ICD-10-CM | POA: Diagnosis not present

## 2022-11-23 DIAGNOSIS — Z7984 Long term (current) use of oral hypoglycemic drugs: Secondary | ICD-10-CM | POA: Insufficient documentation

## 2022-11-23 DIAGNOSIS — E1122 Type 2 diabetes mellitus with diabetic chronic kidney disease: Secondary | ICD-10-CM | POA: Diagnosis not present

## 2022-11-23 DIAGNOSIS — E876 Hypokalemia: Secondary | ICD-10-CM | POA: Diagnosis not present

## 2022-11-23 LAB — COMPREHENSIVE METABOLIC PANEL
ALT: 18 U/L (ref 0–44)
AST: 20 U/L (ref 15–41)
Albumin: 3.6 g/dL (ref 3.5–5.0)
Alkaline Phosphatase: 131 U/L — ABNORMAL HIGH (ref 38–126)
Anion gap: 12 (ref 5–15)
BUN: 21 mg/dL (ref 8–23)
CO2: 24 mmol/L (ref 22–32)
Calcium: 9 mg/dL (ref 8.9–10.3)
Chloride: 104 mmol/L (ref 98–111)
Creatinine, Ser: 1.24 mg/dL — ABNORMAL HIGH (ref 0.44–1.00)
GFR, Estimated: 45 mL/min — ABNORMAL LOW (ref >60)
Glucose, Bld: 169 mg/dL — ABNORMAL HIGH (ref 70–99)
Potassium: 4.2 mmol/L (ref 3.5–5.1)
Sodium: 140 mmol/L (ref 135–145)
Total Bilirubin: 0.5 mg/dL (ref 0.3–1.2)
Total Protein: 7.8 g/dL (ref 6.5–8.1)

## 2022-11-23 LAB — CBC WITH DIFFERENTIAL/PLATELET
Abs Immature Granulocytes: 0.12 10*3/uL — ABNORMAL HIGH (ref 0.00–0.07)
Basophils Absolute: 0.1 10*3/uL (ref 0.0–0.1)
Basophils Relative: 1 %
Eosinophils Absolute: 0.1 10*3/uL (ref 0.0–0.5)
Eosinophils Relative: 1 %
HCT: 43.1 % (ref 36.0–46.0)
Hemoglobin: 13.7 g/dL (ref 12.0–15.0)
Immature Granulocytes: 1 %
Lymphocytes Relative: 27 %
Lymphs Abs: 3.5 10*3/uL (ref 0.7–4.0)
MCH: 29.6 pg (ref 26.0–34.0)
MCHC: 31.8 g/dL (ref 30.0–36.0)
MCV: 93.1 fL (ref 80.0–100.0)
Monocytes Absolute: 0.8 10*3/uL (ref 0.1–1.0)
Monocytes Relative: 6 %
Neutro Abs: 8.5 10*3/uL — ABNORMAL HIGH (ref 1.7–7.7)
Neutrophils Relative %: 64 %
Platelets: 301 10*3/uL (ref 150–400)
RBC: 4.63 MIL/uL (ref 3.87–5.11)
RDW: 15.4 % (ref 11.5–15.5)
WBC: 13.1 10*3/uL — ABNORMAL HIGH (ref 4.0–10.5)
nRBC: 0 % (ref 0.0–0.2)

## 2022-11-23 LAB — VITAMIN B12: Vitamin B-12: 414 pg/mL (ref 180–914)

## 2022-11-23 MED FILL — Ferumoxytol Inj 510 MG/17ML (30 MG/ML) (Elemental Fe): INTRAVENOUS | Qty: 17 | Status: AC

## 2022-11-23 NOTE — Progress Notes (Signed)
[facility] CONSULT NOTE  Patient Care Team: Sherlene Shams, MD as PCP - General (Internal Medicine) End, Cristal Deer, MD as PCP - Cardiology (Cardiology) Chiropractic, Chriss Czar as Referring Physician Earna Coder, MD as Consulting Physician (Hematology)  CHIEF COMPLAINTS/PURPOSE OF CONSULTATION: Anemia   HEMATOLOGY HISTORY  # IRON DEFICIENT ANEMIA SEC TO MALABSORBTION; 2018 IV iron infusion [Dr. Finnegan] EGD/colonoscopy- ?2021 [Dr.Wohl]; NOV 2021-hemoglobin 10.  Iron saturation 5% ferritin 12. EGD- NOV 2022- Dr.Wohl.  # GASTRIC BYPASS x2 [2005]; CAD/CHF [s/p stent; Dr.End]; CKD stage III [Dr.Korrapati]; poorly controlled diabetes- [Dr.Tullo]  HISTORY OF PRESENTING ILLNESS: Alone.  Walking with a rolling walker.  [name] 77 y.o.  female with multiple medical comorbidities including CHF diuretics hypokalemia iron deficiency secondary gastric bypass is here for follow-up.  Patient states that she has been doing a lot better since her last visit. Since then she has had a cardioversion. She has less fatigue, less edema. Still has chronic pain but this is stable. She is so happy that her A. Fib/SVT is now well controlled. She denies any bleeding episodes or significant bruising.   Review of Systems  Constitutional:  Negative for chills, diaphoresis, fever, malaise/fatigue and weight loss.  HENT:  Negative for nosebleeds and sore throat.   Eyes:  Negative for double vision.  Respiratory:  Negative for cough, hemoptysis, sputum production, shortness of breath and wheezing.   Cardiovascular:  Negative for chest pain, palpitations, orthopnea and leg swelling.  Gastrointestinal:  Positive for nausea. Negative for abdominal pain, blood in stool, constipation, heartburn, melena and vomiting.  Genitourinary:  Negative for dysuria, frequency and urgency.  Musculoskeletal:  Positive for back pain, joint pain and myalgias.  Skin: Negative.  Negative for itching and  rash.  Neurological:  Negative for dizziness, tingling, focal weakness, weakness and headaches.  Endo/Heme/Allergies:  Does not bruise/bleed easily.  Psychiatric/Behavioral:  Negative for depression. The patient is not nervous/anxious and does not have insomnia.     MEDICAL HISTORY:  Past Medical History:  Diagnosis Date   (HFpEF) heart failure with preserved ejection fraction (HCC)    a. 12/2019 Echo: EF 65-70%, Gr2 DD. No significant valvular dzs.   Abnormality of gait 03/25/2013   Adrenal mass, left (HCC)    Anginal pain (HCC)    Anxiety    Aortic atherosclerosis (HCC)    Arthritis    Asthma    CAD (coronary artery disease) with h/o Atypical Chest Pain    a. 04/1986 Cath ([facility]): nl cors, EF 65%; b. 07/2012 Cath [facility]): Diff minor irregs; c. 07/2016 MV [facility]): "Equivocal"; d. 08/2016 Cardiac CT Ca2+ score [facility]): Ca2+ 1548; e. 05/2019 MV: No ischemia. EF 75%; f. 12/2019 PCI: LM nl, LAD 65ost (3.5x12 Resolute DES), 39m (2.75x12 Resolute DES),LCX/RCA nl; g. 08/2020 Cath: patent LAD stents, RCA 40ost, elev RH pressures, EF >65%.   Cervical spinal stenosis 1994   due to trauma to back ([employer] accident), has intermittent paralysis and parasthesias   Cervicogenic headache 03/23/2014   CKD (chronic kidney disease), stage III (HCC)    Depression    Diverticulosis    Dizziness    a.) chronic   DJD (degenerative joint disease)    a. Chronic R shoulder pain   Dyspnea    Esophageal stenosis 09/2009   a.) transient outlet obstruction by food, cleared by EGD   Family history of adverse reaction to anesthesia    a.) daughter with (+) PONV   Gastric bypass status for obesity  GERD (gastroesophageal reflux disease)    Headache(784.0)    HLD (hyperlipidemia)    Hypertension    IBS (irritable bowel syndrome)    IDA (iron deficiency anemia)    a.) post 2 unit txfsn 2009, normal endo/colonoscopy by [facility]   Left bundle branch block (LBBB)    a.) Intermittently present - likely rate related.    NSVT (nonsustained ventricular tachycardia) (HCC) 11/15/2020   a.) Holter study 11/15/2020; 11 episodes of NSVT lasting up to 8 beats with a maximum rate of 210 bpm; 14 atrial runs lasting up to 18 beats with a rate of up to 250 bpm.  Some atrial runs felt to be NSVT.   NSVT (nonsustained ventricular tachycardia) (HCC)    a. 10/2020 [facility]: 11 runs of NSVT up to 8 beats.   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 10/2020 [facility]: 14 atrial runs up to 18 beats, max HR 250.   Restless leg syndrome    Rotator cuff arthropathy, right 08/13/2017   Syncope and collapse 03/12/2014   Type II diabetes mellitus (HCC)     SURGICAL HISTORY: Past Surgical History:  Procedure Laterality Date   APPENDECTOMY     BACK SURGERY     CARDIOVERSION N/A 10/30/2022   Procedure: CARDIOVERSION;  Surgeon: [name], MD;  Location: [facility] ORS;  Service: Cardiovascular;  Laterality: N/A;   CHOLECYSTECTOMY N/A 1976   COLONOSCOPY     CORONARY ANGIOPLASTY     CORONARY STENT INTERVENTION N/A 01/04/2020   Procedure: CORONARY STENT INTERVENTION;  Surgeon: Iran Ouch, MD;  Location: [facility] INVASIVE CV LAB;  Service: Cardiovascular;  Laterality: N/A;  LAD    DIAPHRAGMATIC HERNIA REPAIR  2015   ESOPHAGEAL DILATION     multiple   ESOPHAGOGASTRODUODENOSCOPY (EGD) WITH PROPOFOL N/A 05/11/2021   Procedure: ESOPHAGOGASTRODUODENOSCOPY (EGD) WITH PROPOFOL;  Surgeon: [name], MD;  Location: [facility] ENDOSCOPY;  Service: Endoscopy;  Laterality: N/A;   ESOPHAGOGASTRODUODENOSCOPY (EGD) WITH PROPOFOL N/A 06/13/2021   Procedure: ESOPHAGOGASTRODUODENOSCOPY (EGD) WITH PROPOFOL;  Surgeon: [name], MD;  Location: [facility] ENDOSCOPY;  Service: Endoscopy;  Laterality: N/A;   EYE SURGERY     GASTRIC BYPASS  2000, 2005   Dr. Arlana Pouch IMPLANT PLACEMENT  10/2011   Jeannette How IMPLANT PLACEMENT N/A 09/18/2021   Procedure: Leane Platt IMPLANT FIRST STAGE;  Surgeon: Alfredo Martinez, MD;  Location: [facility] ORS;  Service: Urology;  Laterality: N/A;   INTERSTIM IMPLANT PLACEMENT N/A 09/18/2021   Procedure: Leane Platt IMPLANT SECOND STAGE WITH IMPEDENCE CHECK;  Surgeon: Alfredo Martinez, MD;  Location: [facility] ORS;  Service: Urology;  Laterality: N/A;   INTERSTIM IMPLANT REMOVAL N/A 09/18/2021   Procedure: REMOVAL OF INTERSTIM IMPLANT;  Surgeon: Alfredo Martinez, MD;  Location: [facility] ORS;  Service: Urology;  Laterality: N/A;   LEFT HEART CATH AND CORONARY ANGIOGRAPHY N/A 04/1986   LEFT HEART CATH AND CORONARY ANGIOGRAPHY Left 07/24/2012   LEFT HEART CATH AND CORS/GRAFTS ANGIOGRAPHY N/A 12/31/2019   Procedure: LEFT HEART CATH AND CORS/GRAFTS ANGIOGRAPHY;  Surgeon: Antonieta Iba, MD;  Location: [facility] INVAS[facility] CV LAB;  Service: Cardiovascular;  Laterality: N/A;   PANNICULECTOMY N/A 06/16/2019   Procedure: PANNICULECTOMY;  Surgeon: Allena Napoleon, MD;  Location: [facility] OR;  Service: Plastics;  Laterality: N/A;  3 hours, please   REVERSE SHOULDER ARTHROPLASTY Right 08/13/2017   Procedure: REVERSE  RIGHT SHOULDER ARTHROPLASTY;  Surgeon: Teryl Lucy, MD;  [facility];  Service: Orthopedics;  Laterality: Right;   RIGHT/LEFT HEART CATH AND CORONARY ANGIOGRAPHY N/A 09/06/2020   Procedure: RIGHT/LEFT HEART CATH AND CORONARY ANGIOGRAPHY;  Surgeon: [name], MD;  Location: [facility] INV[facility] CV LAB;  Service: Cardiovascular;  Laterality: N/A;   RIGHT/LEFT HEART CATH AND CORONARY ANGIOGRAPHY N/A 06/04/2022   Procedure: RIGHT/LEFT HEART CATH AND CORONARY ANGIOGRAPHY;  Surgeon: [name], MD;  Location: [facility] INVASIVE CV LAB;  Service: Cardiovascular;  Laterality: N/A;   ROTATOR CUFF REPAIR Right    SPINE SURGERY  1995   [place]   TOOTH EXTRACTION  11/13/2021   TOTAL ABDOMINAL HYSTERECTOMY W/ BILATERAL SALPINGOOPHORECTOMY  1974   TOTAL KNEE ARTHROPLASTY Bilateral 2007    Procedure: TOTAL KNEE ARTHROPLASTY; Surgeons: Gerrit Heck, MD and [name], MD   UMBILICAL HERNIA REPAIR  08/11/2015    SOCIAL HISTORY: Social History   Socioeconomic History   Marital status: Married    Spouse name: Alinda Money    Number of children: 2   Years of education: College    Highest education level: Not on file  Occupational History    Employer: retired  Tobacco Use   Smoking status: Never    Passive exposure: Never   Smokeless tobacco: Never  Vaping Use   Vaping Use: Never used  Substance and Sexual Activity   Alcohol use: No   Drug use: Not Currently    Comment: prescribed valium   Sexual activity: Not Currently    Birth control/protection: Post-menopausal, Surgical  Other Topics Concern   Not on file  Social History Narrative   Patient lives at home with husband Alinda Money.    Right Handed   Drinks caffeinated tea occasionally   One pet in house, dog   Social Determinants of Health   Financial Resource Strain: Low Risk  (09/15/2021)   Overall Financial Resource Strain (CARDIA)    Difficulty of Paying Living Expenses: Not hard at all  Food Insecurity: No Food Insecurity (10/19/2020)   Hunger Vital Sign    Worried About Running Out of Food in the Last Year: Never true    Ran Out of Food in the Last Year: Never true  Transportation Needs: No Transportation Needs (10/19/2020)   PRAPARE - Administrator, Civil Service (Medical): No    Lack of Transportation (Non-Medical): No  Physical Activity: Insufficiently Active (11/15/2020)   Exercise Vital Sign    Days of Exercise per Week: 2 days    Minutes of Exercise per Session: 40 min  Stress: Stress Concern Present (08/03/2020)   [employer] of Occupational Health - Occupational Stress Questionnaire    Feeling of Stress : Rather much  Social Connections: Not on file  Intimate Partner Violence: Not At Risk (10/19/2020)   Humiliation, Afraid, Rape, and Kick questionnaire    Fear of Current or Ex-Partner: No     Emotionally Abused: No    Physically Abused: No    Sexually Abused: No    FAMILY HISTORY: Family History  Problem Relation Age of Onset   Heart disease Father    Hypertension Father    Prostate cancer Father    Stroke Father    Osteoporosis Father    Stroke Mother    Depression Mother    Headache Mother    Heart disease Mother    Thyroid disease Mother    Hypertension Mother    Diabetes Daughter    Heart disease Daughter    Hypertension Daughter  Hypertension Son     ALLERGIES:  is allergic to demeclocycline, erythromycin, flagyl [metronidazole], glucophage [metformin hcl], tetracyclines & related, furosemide, tetracycline, diovan [valsartan], sulfa antibiotics, and xanax [alprazolam].  MEDICATIONS:  Current Outpatient Medications  Medication Sig Dispense Refill   albuterol (VENTOLIN HFA) 108 (90 Base) MCG/ACT inhaler Inhale 1-2 puffs into the lungs every 6 (six) hours as needed for wheezing or shortness of breath. 18 g 2   apixaban (ELIQUIS) 5 MG TABS tablet Take 1 tablet (5 mg total) by mouth 2 (two) times daily. Lot # [id number] Exp. 5/25 28 tablet 0   atorvastatin (LIPITOR) 80 MG tablet Take 1 tablet (80 mg total) by mouth daily. 90 tablet 1   benzonatate (TESSALON) 200 MG capsule Take 1 capsule (200 mg total) by mouth 2 (two) times daily as needed for cough. 20 capsule 0   butalbital-acetaminophen-caffeine (FIORICET) 50-325-40 MG tablet TAKE 1 TABLET BY MOUTH EVERY 6 HOURS AS NEEDED FOR HEADACHE. 60 tablet 2   Cholecalciferol (VITAMIN D3) 250 MCG (10000 UT) capsule Take 10,000 Units by mouth daily.     clotrimazole-betamethasone (LOTRISONE) cream Apply 1 application topically 2 (two) times daily as needed (irritation).     conjugated estrogens (PREMARIN) vaginal cream Apply one pea-sized amount around the opening of the urethra daily for 2 weeks, then 3 times weekly moving forward. (Patient taking differently: Apply one pea-sized amount around the opening of the urethra  3  times weekly) 30 g 4   Continuous Blood Gluc Receiver (FREESTYLE LIBRE 2 READER) DEVI Use to check sugar at least TID 1 each 1   cyanocobalamin (,VITAMIN B-12,) 1000 MCG/ML injection Inject 1 mL (1,000 mcg total) into the muscle every 30 (thirty) days. Inject 1 ml (1000 mcg ) IM weekly x 4,  Then monthly thereafter 3 mL 1   dapagliflozin propanediol (FARXIGA) 10 MG TABS tablet Take 1 tablet (10 mg total) by mouth daily. 90 tablet 3   dicyclomine (BENTYL) 20 MG tablet Take 1 tablet (20 mg total) by mouth 3 (three) times daily before meals. 180 tablet 2   DULoxetine (CYMBALTA) 30 MG capsule Take 1 capsule (30 mg total) by mouth daily. 90 capsule 4   fexofenadine (ALLEGRA) 180 MG tablet Take 180 mg by mouth as needed for allergies or rhinitis.     fluticasone (FLONASE) 50 MCG/ACT nasal spray Place 2 sprays into both nostrils daily. (Patient taking differently: Place 2 sprays into both nostrils 3 (three) times daily as needed for allergies.) 16 g 6   Glucagon (GVOKE HYPOPEN 2-PACK) 0.5 MG/0.1ML SOAJ Inject 1 auto injector subcutaneously as needed for severe hypoglycemia. May repeat x 1 after 15 minutes if needed. 0.1 mL 2   glucose blood (ONETOUCH VERIO) test strip Use to check blood sugar twice daily. 100 each 1   insulin aspart (NOVOLOG FLEXPEN) 100 UNIT/ML FlexPen (Inject 4-16 units with meals per sliding scale; max daily dose 36 units) If pre-meal glucose <100, inject 4 units; 100-150 - inject 6 units, 151-200 - inject 8 units; 201-250 - inject 10 units; 251-300 - inject 12 units, 301-350 - inject 14 units ; 350 > inject 16 units); 15 mL 11   insulin degludec (TRESIBA FLEXTOUCH) 100 UNIT/ML FlexTouch Pen Inject 12 Units into the skin daily. 15 mL 3   Insulin Pen Needle (DROPLET PEN NEEDLES) 31G X 5 MM MISC USE ONE NEEDLE SUBCUTANEOUSLY AS DIRECTED (REMOVE AND DISCARD NEEDLE IN SHARPS CONTAINER IMMEDIATELY AFTER USE) 100 each 1   loperamide (IMODIUM) 2 MG capsule  Take 4-6 mg by mouth 3 (three) times  daily before meals.     magnesium gluconate (MAGONATE) 500 MG tablet Take 500 mg by mouth daily.     nitroGLYCERIN (NITROSTAT) 0.4 MG SL tablet Place 1 tablet (0.4 mg total) under the tongue every 5 (five) minutes as needed for chest pain. 25 tablet 0   nystatin (MYCOSTATIN/NYSTOP) powder Apply 1 g topically 4 (four) times daily as needed (rash).     ondansetron (ZOFRAN-ODT) 4 MG disintegrating tablet Take 1 tablet (4 mg total) by mouth every 8 (eight) hours as needed for nausea or vomiting. 30 tablet 0   OneTouch Delica Lancets 33G MISC Used to check blood sugar two times a day. 100 each 4   OVER THE COUNTER MEDICATION Apply 1 application  topically daily as needed (pain). [place] Gel topical pain reliever     pantoprazole (PROTONIX) 40 MG tablet Take 1 tablet (40 mg total) by mouth daily. 90 tablet 2   Polyethyl Glycol-Propyl Glycol (SYSTANE OP) Place 1 drop into both eyes 4 (four) times daily as needed (dry eyes).     pregabalin (LYRICA) 50 MG capsule Take 1 capsule (50 mg total) by mouth 2 (two) times daily. 60 capsule 2   No current facility-administered medications for this visit.     Vitals:   11/23/22 1420  BP: 130/61  Pulse: 85  Temp: 98.7 F (37.1 C)  SpO2: 97%   Wt Readings from Last 3 Encounters:  11/23/22 244 lb (110.7 kg)  11/14/22 244 lb (110.7 kg)  10/30/22 237 lb (107.5 kg)     Physical Exam Constitutional:      Comments: Obese.  HENT:     Head: Normocephalic and atraumatic.     Mouth/Throat:     Pharynx: No oropharyngeal exudate.  Eyes:     Pupils: Pupils are equal, round, and reactive to light.  Cardiovascular:     Rate and Rhythm: Normal rate and regular rhythm.     Pulses: Normal pulses.     Heart sounds: Normal heart sounds.  Pulmonary:     Effort: Pulmonary effort is normal. No respiratory distress.     Breath sounds: Normal breath sounds. No wheezing.  Abdominal:     General: Bowel sounds are normal. There is no distension.     Palpations: Abdomen  is soft. There is no mass.     Tenderness: There is no abdominal tenderness. There is no guarding or rebound.  Musculoskeletal:        General: No tenderness. Normal range of motion.     Cervical back: Normal range of motion and neck supple.  Skin:    General: Skin is warm.  Neurological:     Mental Status: She is alert and oriented to person, place, and time.  Psychiatric:        Mood and Affect: Affect normal.     LABORATORY DATA:  I have reviewed the data as listed Lab Results  Component Value Date   WBC 13.1 (H) 11/23/2022   HGB 13.7 11/23/2022   HCT 43.1 11/23/2022   MCV 93.1 11/23/2022   PLT 301 11/23/2022   Recent Labs    02/09/22 1119 03/22/22 1626 07/13/22 1529 07/25/22 1311 09/27/22 1731 10/23/22 1324  NA 143   < > 140 139 141 138  K 3.7   < > 4.4 4.3 4.0 4.3  CL 107   < > 106 106 106 110  CO2 24   < > 20 24 26  20*  GLUCOSE 129*   < > 117* 164* 109* 235*  BUN 11   < > 14 15 17 17   CREATININE 1.06   < > 1.14* 1.09* 1.10* 1.24*  CALCIUM 8.7   < > 8.9 8.5* 8.4* 8.4*  GFRNONAA  --    < >  --  53* 52* 45*  PROT 6.3  --  6.7 7.1 7.0  --   ALBUMIN 3.5  --   --  3.4* 3.2*  --   AST 13  --  11 22 20   --   ALT 11  --  7 15 14   --   ALKPHOS 132*  --   --  145* 122  --   BILITOT 0.7  --  0.3 0.2* 0.5  --    < > = values in this interval not displayed.    Iron deficiency Iron deficiency/secondary to gastric bypass/malabsorption: hemoglobin ferritin 12 saturation 5% hemoglobin 10 [November 2021].    Today Hgb is 13.7. Iron studies/ferritin- pending today but suspected to be normal given normal MCV and Hgb. Pt would like to hold Feraheme today which is reasonable given her CBC results. RTC 4 months.    # Weight gain/bloated/ poor appetite- Resolved with improvement in cardiac functioning. CT scan from last visit was not concerning for malignancy.    # B12 deficiency secondary gastric bypass. Stable. Continue home monthly IM injections.     # DISPOSITION: No  Feraheme today Follow up 4 month; MD: labs- cbc/cmp, iron, ferritin, B12 levels, retic;  possible Feraheme- [place]      All questions were answered. The patient knows to call the clinic with any problems, questions or concerns.   All questions were answered. The patient knows to call the clinic with any problems, questions or concerns.    Rushie Chestnut, PA-C 11/23/2022 2:34 PM

## 2022-11-27 ENCOUNTER — Encounter: Payer: Self-pay | Admitting: Medical

## 2022-11-27 ENCOUNTER — Ambulatory Visit: Payer: Medicare Other | Attending: Medical | Admitting: Medical

## 2022-11-27 VITALS — BP 104/60 | HR 90 | Ht 64.5 in | Wt 248.0 lb

## 2022-11-27 DIAGNOSIS — E782 Mixed hyperlipidemia: Secondary | ICD-10-CM

## 2022-11-27 DIAGNOSIS — I25118 Atherosclerotic heart disease of native coronary artery with other forms of angina pectoris: Secondary | ICD-10-CM | POA: Diagnosis not present

## 2022-11-27 DIAGNOSIS — I4819 Other persistent atrial fibrillation: Secondary | ICD-10-CM | POA: Diagnosis not present

## 2022-11-27 MED ORDER — METOPROLOL SUCCINATE ER 25 MG PO TB24: 12.5000 mg | ORAL_TABLET | Freq: Every day | ORAL | 3 refills | Status: DC

## 2022-11-27 MED ORDER — EZETIMIBE 10 MG PO TABS: 10.0000 mg | ORAL_TABLET | Freq: Every day | ORAL | 3 refills | Status: DC

## 2022-11-27 NOTE — Progress Notes (Addendum)
Cardiology Office Note:    Date:  11/27/2022   ID:  Denise Sharp, DOB 01/02/1946, MRN [id number]  PCP:  Sherlene Shams, MD  [facility] Cardiologist:  [name], MD  [facility] Electrophysiologist:  None   Referring MD: Sherlene Shams, MD   Chief Complaint: DCCV follow-up  History of Present Illness:    Denise Sharp is a 77 y.o. female with a hx of CAD status post PCI to the ostial and mid LAD in the setting of unstable angina in 12/2019, chronic HFpEF, A-fib, hypertension, type 2 diabetes, iron deficiency anemia following gastric bypass x 2, IBS, esophageal stenosis, chronic dizziness with gait instability, cervicogenic headache with cervical spinal stenosis, OSA on CPAP, restless leg syndrome and adrenal mass who presents for follow-up.  Patient's visit in April 2024 she was found to be in A-fib.  She reported fatigue and shortness of breath.  Patient was set up for TEE cardioversion.  Today, the patient is in NSR with PVCs. She is having GI issues with diarrhea and sugar issues. She is feeling weak. She denies chest pain or shortness of breath. Pulse is staying in the 60s. BP is low today.   Past Medical History:  Diagnosis Date   (HFpEF) heart failure with preserved ejection fraction (HCC)    a. 12/2019 Echo: EF 65-70%, Gr2 DD. No significant valvular dzs.   Abnormality of gait 03/25/2013   Adrenal mass, left (HCC)    Anginal pain (HCC)    Anxiety    Aortic atherosclerosis (HCC)    Arthritis    Asthma    CAD (coronary artery disease) with h/o Atypical Chest Pain    a. 04/1986 Cath ([facility]): nl cors, EF 65%; b. 07/2012 Cath [facility]): Diff minor irregs; c. 07/2016 MV [facility]): "Equivocal"; d. 08/2016 Cardiac CT Ca2+ score [facility]): Ca2+ 1548; e. 05/2019 MV: No ischemia. EF 75%; f. 12/2019 PCI: LM nl, LAD 65ost (3.5x12 Resolute DES), 35m (2.75x12 Resolute DES),LCX/RCA nl; g. 08/2020 Cath: patent LAD stents, RCA 40ost, elev RH pressures, EF >65%.   Cervical spinal stenosis  1994   due to trauma to back ([employer] accident), has intermittent paralysis and parasthesias   Cervicogenic headache 03/23/2014   CKD (chronic kidney disease), stage III (HCC)    Depression    Diverticulosis    Dizziness    a.) chronic   DJD (degenerative joint disease)    a. Chronic R shoulder pain   Dyspnea    Esophageal stenosis 09/2009   a.) transient outlet obstruction by food, cleared by EGD   Family history of adverse reaction to anesthesia    a.) daughter with (+) PONV   Gastric bypass status for obesity    GERD (gastroesophageal reflux disease)    Headache(784.0)    HLD (hyperlipidemia)    Hypertension    IBS (irritable bowel syndrome)    IDA (iron deficiency anemia)    a.) post 2 unit txfsn 2009, normal endo/colonoscopy by [facility]   Left bundle branch block (LBBB)    a.) Intermittently present - likely rate related.   NSVT (nonsustained ventricular tachycardia) (HCC) 11/15/2020   a.) Holter study 11/15/2020; 11 episodes of NSVT lasting up to 8 beats with a maximum rate of 210 bpm; 14 atrial runs lasting up to 18 beats with a rate of up to 250 bpm.  Some atrial runs felt to be NSVT.   NSVT (nonsustained ventricular tachycardia) (HCC)    a. 10/2020 [facility]: 11 runs of NSVT up to 8  beats.   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 10/2020 [facility]: 14 atrial runs up to 18 beats, max HR 250.   Restless leg syndrome    Rotator cuff arthropathy, right 08/13/2017   Syncope and collapse 03/12/2014   Type II diabetes mellitus (HCC)     Past Surgical History:  Procedure Laterality Date   APPENDECTOMY     BACK SURGERY     CARDIOVERSION N/A 10/30/2022   Procedure: CARDIOVERSION;  Surgeon: [name], MD;  Location: [facility] ORS;  Service: Cardiovascular;  Laterality: N/A;   CHOLECYSTECTOMY N/A 1976   COLONOSCOPY     CORONARY ANGIOPLASTY      CORONARY STENT INTERVENTION N/A 01/04/2020   Procedure: CORONARY STENT INTERVENTION;  Surgeon: Iran Ouch, MD;  Location: [facility] INVASIVE CV LAB;  Service: Cardiovascular;  Laterality: N/A;  LAD    DIAPHRAGMATIC HERNIA REPAIR  2015   ESOPHAGEAL DILATION     multiple   ESOPHAGOGASTRODUODENOSCOPY (EGD) WITH PROPOFOL N/A 05/11/2021   Procedure: ESOPHAGOGASTRODUODENOSCOPY (EGD) WITH PROPOFOL;  Surgeon: [name], MD;  Location: [facility] ENDOSCOPY;  Service: Endoscopy;  Laterality: N/A;   ESOPHAGOGASTRODUODENOSCOPY (EGD) WITH PROPOFOL N/A 06/13/2021   Procedure: ESOPHAGOGASTRODUODENOSCOPY (EGD) WITH PROPOFOL;  Surgeon: [name], MD;  Location: [facility] ENDOSCOPY;  Service: Endoscopy;  Laterality: N/A;   EYE SURGERY     GASTRIC BYPASS  2000, 2005   Dr. Arlana Pouch IMPLANT PLACEMENT  10/2011   Jeannette How IMPLANT PLACEMENT N/A 09/18/2021   Procedure: Leane Platt IMPLANT FIRST STAGE;  Surgeon: Alfredo Martinez, MD;  Location: [facility] ORS;  Service: Urology;  Laterality: N/A;   INTERSTIM IMPLANT PLACEMENT N/A 09/18/2021   Procedure: Leane Platt IMPLANT SECOND STAGE WITH IMPEDENCE CHECK;  Surgeon: Alfredo Martinez, MD;  Location: [facility] ORS;  Service: Urology;  Laterality: N/A;   INTERSTIM IMPLANT REMOVAL N/A 09/18/2021   Procedure: REMOVAL OF INTERSTIM IMPLANT;  Surgeon: Alfredo Martinez, MD;  Location: [facility] ORS;  Service: Urology;  Laterality: N/A;   LEFT HEART CATH AND CORONARY ANGIOGRAPHY N/A 04/1986   LEFT HEART CATH AND CORONARY ANGIOGRAPHY Left 07/24/2012   LEFT HEART CATH AND CORS/GRAFTS ANGIOGRAPHY N/A 12/31/2019   Procedure: LEFT HEART CATH AND CORS/GRAFTS ANGIOGRAPHY;  Surgeon: Antonieta Iba, MD;  Location: [facility];  Service: Cardiovascular;  Laterality: N/A;   PANNICULECTOMY N/A 06/16/2019   Procedure: PANNICULECTOMY;  Surgeon: Allena Napoleon, MD;  Location: [facility] OR;  Service: Plastics;  Laterality: N/A;  3 hours, please   REVERSE SHOULDER ARTHROPLASTY Right  08/13/2017   Procedure: REVERSE RIGHT SHOULDER ARTHROPLASTY;  Surgeon: Teryl Lucy, MD;  Location: [facility] OR;  Service: Orthopedics;  Laterality: Right;   RIGHT/LEFT HEART CATH AND CORONARY ANGIOGRAPHY N/A 09/06/2020   Procedure: RIGHT/LEFT HEART CATH AND CORONARY ANGIOGRAPHY;  Surgeon: [name], MD;  Location: [facility];  Service: Cardiovascular;  Laterality: N/A;   RIGHT/LEFT HEART CATH AND CORONARY ANGIOGRAPHY N/A 06/04/2022   Procedure: RIGHT/LEFT HEART CATH AND CORONARY ANGIOGRAPHY;  Surgeon: [name], MD;  Location: [facility];  Service: Cardiovascular;  Laterality: N/A;   ROTATOR CUFF REPAIR Right    SPINE SURGERY  1995   [place]   TOOTH EXTRACTION  11/13/2021   TOTAL ABDOMINAL HYSTERECTOMY W/ BILATERAL SALPINGOOPHORECTOMY  1974   TOTAL KNEE ARTHROPLASTY Bilateral 2007   Procedure: TOTAL KNEE ARTHROPLASTY; Surgeons: Gerrit Heck,  MD and [name], MD   UMBILICAL HERNIA REPAIR  08/11/2015    Current Medications: Current Meds  Medication Sig   albuterol (VENTOLIN HFA) 108 (90 Base) MCG/ACT inhaler Inhale 1-2 puffs into the lungs every 6 (six) hours as needed for wheezing or shortness of breath.   apixaban (ELIQUIS) 5 MG TABS tablet Take 1 tablet (5 mg total) by mouth 2 (two) times daily. Lot # [id number] Exp. 5/25   atorvastatin (LIPITOR) 80 MG tablet Take 1 tablet (80 mg total) by mouth daily.   benzonatate (TESSALON) 200 MG capsule Take 1 capsule (200 mg total) by mouth 2 (two) times daily as needed for cough.   butalbital-acetaminophen-caffeine (FIORICET) 50-325-40 MG tablet TAKE 1 TABLET BY MOUTH EVERY 6 HOURS AS NEEDED FOR HEADACHE.   Cholecalciferol (VITAMIN D3) 250 MCG (10000 UT) capsule Take 10,000 Units by mouth daily.   clotrimazole-betamethasone (LOTRISONE) cream Apply 1 application topically 2 (two) times daily as needed (irritation).   Continuous Blood Gluc Receiver (FREESTYLE LIBRE 2 READER) DEVI Use to check sugar at least TID   cyanocobalamin  (,VITAMIN B-12,) 1000 MCG/ML injection Inject 1 mL (1,000 mcg total) into the muscle every 30 (thirty) days. Inject 1 ml (1000 mcg ) IM weekly x 4,  Then monthly thereafter   dapagliflozin propanediol (FARXIGA) 10 MG TABS tablet Take 1 tablet (10 mg total) by mouth daily.   dicyclomine (BENTYL) 20 MG tablet Take 1 tablet (20 mg total) by mouth 3 (three) times daily before meals.   DULoxetine (CYMBALTA) 30 MG capsule Take 1 capsule (30 mg total) by mouth daily.   ezetimibe (ZETIA) 10 MG tablet Take 1 tablet (10 mg total) by mouth daily.   fexofenadine (ALLEGRA) 180 MG tablet Take 180 mg by mouth as needed for allergies or rhinitis.   fluticasone (FLONASE) 50 MCG/ACT nasal spray Place 2 sprays into both nostrils daily. (Patient taking differently: Place 2 sprays into both nostrils 3 (three) times daily as needed for allergies.)   Glucagon (GVOKE HYPOPEN 2-PACK) 0.5 MG/0.1ML SOAJ Inject 1 auto injector subcutaneously as needed for severe hypoglycemia. May repeat x 1 after 15 minutes if needed.   glucose blood (ONETOUCH VERIO) test strip Use to check blood sugar twice daily.   insulin aspart (NOVOLOG FLEXPEN) 100 UNIT/ML FlexPen (Inject 4-16 units with meals per sliding scale; max daily dose 36 units) If pre-meal glucose <100, inject 4 units; 100-150 - inject 6 units, 151-200 - inject 8 units; 201-250 - inject 10 units; 251-300 - inject 12 units, 301-350 - inject 14 units ; 350 > inject 16 units);   insulin degludec (TRESIBA FLEXTOUCH) 100 UNIT/ML FlexTouch Pen Inject 12 Units into the skin daily.   Insulin Pen Needle (DROPLET PEN NEEDLES) 31G X 5 MM MISC USE ONE NEEDLE SUBCUTANEOUSLY AS DIRECTED (REMOVE AND DISCARD NEEDLE IN SHARPS CONTAINER IMMEDIATELY AFTER USE)   loperamide (IMODIUM) 2 MG capsule Take 4-6 mg by mouth 3 (three) times daily before meals.   magnesium gluconate (MAGONATE) 500 MG tablet Take 500 mg by mouth daily.   metoprolol succinate (TOPROL-XL) 25 MG 24 hr tablet Take 0.5 tablets (12.5  mg total) by mouth daily. Take with or immediately following a meal.   nitroGLYCERIN (NITROSTAT) 0.4 MG SL tablet Place 1 tablet (0.4 mg total) under the tongue every 5 (five) minutes as needed for chest pain.   nystatin (MYCOSTATIN/NYSTOP) powder Apply 1 g topically 4 (four) times daily as needed (rash).   ondansetron (ZOFRAN-ODT) 4 MG disintegrating tablet Take 1 tablet (  4 mg total) by mouth every 8 (eight) hours as needed for nausea or vomiting.   OneTouch Delica Lancets 33G MISC Used to check blood sugar two times a day.   OVER THE COUNTER MEDICATION Apply 1 application  topically daily as needed (pain). [place] Gel topical pain reliever   pantoprazole (PROTONIX) 40 MG tablet Take 1 tablet (40 mg total) by mouth daily.   Polyethyl Glycol-Propyl Glycol (SYSTANE OP) Place 1 drop into both eyes 4 (four) times daily as needed (dry eyes).   pregabalin (LYRICA) 50 MG capsule Take 1 capsule (50 mg total) by mouth 2 (two) times daily.     Allergies:   Demeclocycline, Erythromycin, Flagyl [metronidazole], Glucophage [metformin hcl], Tetracyclines & related, Furosemide, Tetracycline, Diovan [valsartan], Sulfa antibiotics, and Xanax [alprazolam]   Social History   Socioeconomic History   Marital status: Married    Spouse name: Alinda Money    Number of children: 2   Years of education: College    Highest education level: Not on file  Occupational History    Employer: retired  Tobacco Use   Smoking status: Never    Passive exposure: Never   Smokeless tobacco: Never  Vaping Use   Vaping Use: Never used  Substance and Sexual Activity   Alcohol use: No   Drug use: Not Currently    Comment: prescribed valium   Sexual activity: Not Currently    Birth control/protection: Post-menopausal, Surgical  Other Topics Concern   Not on file  Social History Narrative   Patient lives at home with husband Alinda Money.    Right Handed   Drinks caffeinated tea occasionally   One pet in house, dog   Social Determinants  of Health   Financial Resource Strain: Low Risk  (09/15/2021)   Overall Financial Resource Strain (CARDIA)    Difficulty of Paying Living Expenses: Not hard at all  Food Insecurity: No Food Insecurity (10/19/2020)   Hunger Vital Sign    Worried About Running Out of Food in the Last Year: Never true    Ran Out of Food in the Last Year: Never true  Transportation Needs: No Transportation Needs (10/19/2020)   PRAPARE - Administrator, Civil Service (Medical): No    Lack of Transportation (Non-Medical): No  Physical Activity: Insufficiently Active (11/15/2020)   Exercise Vital Sign    Days of Exercise per Week: 2 days    Minutes of Exercise per Session: 40 min  Stress: Stress Concern Present (08/03/2020)   [employer] of Occupational Health - Occupational Stress Questionnaire    Feeling of Stress : Rather much  Social Connections: Not on file     Family History: The patient's family history includes Depression in her mother; Diabetes in her daughter; Headache in her mother; Heart disease in her daughter, father, and mother; Hypertension in her daughter, father, mother, and son; Osteoporosis in her father; Prostate cancer in her father; Stroke in her father and mother; Thyroid disease in her mother.  ROS:   Please see the history of present illness.     All other systems reviewed and are negative.  EKGs/Labs/Other Studies Reviewed:    The following studies were reviewed today:  R/L Cardiac cath 05/2022 Conclusions: Mild-moderate, non-obstructive coronary artery disease, similar to prior catheterization in 08/2020. Widely patent ostial LAD stent. Patent mid LAD stent with minimal in stent restenosis (~10% narrowing). Upper normal left and right heart filling pressures. Borderline elevated pulmonary artery pressure. Normal to mildly reduced cardiac output/index.   Recommendations:  No obvious findings to explain the patient's progressive symptoms and NSVT on recent event  monitor. Restart metoprolol succinate 25 mg daily. Aggressive secondary prevention of coronary artery disease and medical therapy for chronic HFpEF.   [name], MD [facility]  Heart monitor 05/2022    The patient was enrolled for 30 days.   The predominant rhythm was sinus with an average rate of 76 bpm (range 56-122 bpm).   Six episodes of nonsustained ventricular tachycardia were observed, lasting up to 11 beats.   No sustained arrhythmia or prolonged pause occurred.  There was no evidence of atrial fibrillation/flutter.   Patient triggered events correspond to sinus rhythm, PACs, and PVCs.   Predominantly sinus rhythm with several episodes of nonsustained ventricular tachycardia.  No atrial fibrillation/flutter identified.  Myoview lexiscan 01/2022 Narrative & Impression  Pharmacological myocardial perfusion imaging study with no significant  ischemia Normal wall motion, EF estimated at 95% No EKG changes concerning for ischemia at peak stress or in recovery. CT attenuation correction images with coronary calcification in the LAD Low risk scan   Signed, Dossie Arbour, MD, Ph.D [facility]    EKG:  EKG is ordered today.  The ekg ordered today demonstrates NSR with frequent PVCs  Recent Labs: 05/31/2022: B Natriuretic Peptide 111.6 09/27/2022: Magnesium 2.0; TSH 2.887 11/23/2022: ALT 18; BUN 21; Creatinine, Ser 1.24; Hemoglobin 13.7; Platelets 301; Potassium 4.2; Sodium 140  Recent Lipid Panel    Component Value Date/Time   CHOL 103 10/23/2022 1324   CHOL 155 02/27/2014 0420   TRIG 98 10/23/2022 1324   TRIG 121 02/27/2014 0420   HDL 38 (L) 10/23/2022 1324   HDL 43 02/27/2014 0420   CHOLHDL 2.7 10/23/2022 1324   VLDL 20 10/23/2022 1324   VLDL 24 02/27/2014 0420   LDLCALC 45 10/23/2022 1324   LDLCALC 114 (H) 07/13/2022 1529   LDLCALC 88 02/27/2014 0420   LDLDIRECT 126 (H) 07/13/2022 1529     Physical Exam:    VS:  BP 104/60 (BP Location: Left Arm,  Patient Position: Sitting, Cuff Size: Normal)   Pulse 90   Ht 5' 4.5" (1.638 m)   Wt 248 lb (112.5 kg)   SpO2 96%   BMI 41.91 kg/m     Wt Readings from Last 3 Encounters:  11/27/22 248 lb (112.5 kg)  11/23/22 244 lb (110.7 kg)  11/14/22 244 lb (110.7 kg)     GEN:  Well nourished, well developed in no acute distress HEENT: Normal NECK: No JVD; No carotid bruits LYMPHATICS: No lymphadenopathy CARDIAC: RRR, no murmurs, rubs, gallops RESPIRATORY:  Clear to auscultation without rales, wheezing or rhonchi  ABDOMEN: Soft, non-tender, non-distended MUSCULOSKELETAL:  No edema; No deformity  SKIN: Warm and dry NEUROLOGIC:  Alert and oriented x 3 PSYCHIATRIC:  Normal affect   ASSESSMENT:    1. Persistent atrial fibrillation (HCC)   2. Hyperlipidemia, mixed   3. Coronary artery disease of native artery of native heart with stable angina pectoris (HCC)    PLAN:    In order of problems listed above:  Persistent Afib Patient underwent DCCV 4/16 and is in NSR today. EKG shows NSR with frequent PVCs, 90bpm. BP is soft, but patient reports it's normally higher. I will start Toprol 12.5mg  daily for rate control. Continue Eliquis 5mg  BID for stroke ppx.   HLD LDL 45. I will add on Zetia 10mg  daily. Continue Lipitor 80mg  daily.   CAD Patient denies anginal symptoms. Most recent cath in 05/2022 showed patent stents  with otherwise mild CAD. No further ischemic work-up indicated at this time. No ASA with Eliquis. Continue statin, Zetia and BB therapy.   Disposition: Follow up in 2 months with MD/APP   Signed, Wrenley Sayed David Stall, PA-C  11/27/2022 3:46 PM    [facility]

## 2022-11-27 NOTE — Patient Instructions (Signed)
Medication Instructions:  Your physician recommends the following medication changes.  START TAKING: Metoprolol Succinate 12.5 mg by mouth daily Zetia 10 mg by mouth daily  *If you need a refill on your cardiac medications before your next appointment, please call your pharmacy*   Lab Work: None ordered today   Testing/Procedures: None ordered today   Follow-Up: At [facility], you and your health needs are our priority.  As part of our continuing mission to provide you with exceptional heart care, we have created designated Provider Care Teams.  These Care Teams include your primary Cardiologist (physician) and Advanced Practice Providers (APPs -  Physician Assistants and Nurse Practitioners) who all work together to provide you with the care you need, when you need it.  We recommend signing up for the patient portal called "MyChart".  Sign up information is provided on this After Visit Summary.  MyChart is used to connect with patients for Virtual Visits (Telemedicine).  Patients are able to view lab/test results, encounter notes, upcoming appointments, etc.  Non-urgent messages can be sent to your provider as well.   To learn more about what you can do with MyChart, go to [email or web address].    Your next appointment:   2 month(s)  Provider:   You may see [name], MD or one of the following Advanced Practice Providers on your designated Care Team:   Nicolasa Ducking, NP Eula Listen, PA-C Cadence Fransico Michael, PA-C Charlsie Quest, NP

## 2022-12-05 NOTE — Patient Instructions (Signed)

## 2022-12-06 ENCOUNTER — Ambulatory Visit (INDEPENDENT_AMBULATORY_CARE_PROVIDER_SITE_OTHER): Payer: Medicare Other | Admitting: Nurse Practitioner

## 2022-12-06 ENCOUNTER — Encounter: Payer: Self-pay | Admitting: Nurse Practitioner

## 2022-12-06 VITALS — BP 118/66 | HR 73 | Ht 64.5 in | Wt 250.0 lb

## 2022-12-06 DIAGNOSIS — I10 Essential (primary) hypertension: Secondary | ICD-10-CM | POA: Diagnosis not present

## 2022-12-06 DIAGNOSIS — E559 Vitamin D deficiency, unspecified: Secondary | ICD-10-CM

## 2022-12-06 DIAGNOSIS — E1165 Type 2 diabetes mellitus with hyperglycemia: Secondary | ICD-10-CM

## 2022-12-06 DIAGNOSIS — E278 Other specified disorders of adrenal gland: Secondary | ICD-10-CM | POA: Diagnosis not present

## 2022-12-06 DIAGNOSIS — Z794 Long term (current) use of insulin: Secondary | ICD-10-CM | POA: Diagnosis not present

## 2022-12-06 DIAGNOSIS — E782 Mixed hyperlipidemia: Secondary | ICD-10-CM

## 2022-12-06 LAB — POCT GLYCOSYLATED HEMOGLOBIN (HGB A1C): Hemoglobin A1C: 7.8 % — AB (ref 4.0–5.6)

## 2022-12-06 MED ORDER — NOVOLOG FLEXPEN 100 UNIT/ML ~~LOC~~ SOPN: 6.0000 [IU] | PEN_INJECTOR | Freq: Three times a day (TID) | SUBCUTANEOUS | 3 refills | Status: DC

## 2022-12-06 NOTE — Progress Notes (Signed)
Endocrinology Consult Note       12/07/2022, 7:17 AM   Subjective:    Patient ID: Denise Sharp, female    DOB: 1945/09/02.  Denise Sharp is being seen in consultation for management of currently uncontrolled symptomatic diabetes requested by  Sherlene Shams, MD.   Past Medical History:  Diagnosis Date   (HFpEF) heart failure with preserved ejection fraction (HCC)    a. 12/2019 Echo: EF 65-70%, Gr2 DD. No significant valvular dzs.   Abnormality of gait 03/25/2013   Adrenal mass, left (HCC)    Anginal pain (HCC)    Anxiety    Aortic atherosclerosis (HCC)    Arthritis    Asthma    CAD (coronary artery disease) with h/o Atypical Chest Pain    a. 04/1986 Cath ([facility]): nl cors, EF 65%; b. 07/2012 Cath [facility]): Diff minor irregs; c. 07/2016 MV [facility]): "Equivocal"; d. 08/2016 Cardiac CT Ca2+ score [facility]): Ca2+ 1548; e. 05/2019 MV: No ischemia. EF 75%; f. 12/2019 PCI: LM nl, LAD 65ost (3.5x12 Resolute DES), 92m (2.75x12 Resolute DES),LCX/RCA nl; g. 08/2020 Cath: patent LAD stents, RCA 40ost, elev RH pressures, EF >65%.   Cervical spinal stenosis 1994   due to trauma to back ([employer] accident), has intermittent paralysis and parasthesias   Cervicogenic headache 03/23/2014   CKD (chronic kidney disease), stage III (HCC)    Depression    Diverticulosis    Dizziness    a.) chronic   DJD (degenerative joint disease)    a. Chronic R shoulder pain   Dyspnea    Esophageal stenosis 09/2009   a.) transient outlet obstruction by food, cleared by EGD   Family history of adverse reaction to anesthesia    a.) daughter with (+) PONV   Gastric bypass status for obesity    GERD (gastroesophageal reflux disease)    Headache(784.0)    HLD (hyperlipidemia)    Hypertension    IBS (irritable bowel syndrome)    IDA (iron deficiency anemia)    a.) post 2 unit txfsn 2009, normal endo/colonoscopy by [facility]   Left bundle branch block  (LBBB)    a.) Intermittently present - likely rate related.   NSVT (nonsustained ventricular tachycardia) (HCC) 11/15/2020   a.) Holter study 11/15/2020; 11 episodes of NSVT lasting up to 8 beats with a maximum rate of 210 bpm; 14 atrial runs lasting up to 18 beats with a rate of up to 250 bpm.  Some atrial runs felt to be NSVT.   NSVT (nonsustained ventricular tachycardia) (HCC)    a. 10/2020 [facility]: 11 runs of NSVT up to 8 beats.   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 10/2020 [facility]: 14 atrial runs up to 18 beats, max HR 250.   Restless leg syndrome    Rotator cuff arthropathy, right 08/13/2017   Syncope and collapse 03/12/2014   Type II diabetes mellitus (HCC)     Past Surgical History:  Procedure Laterality Date   APPENDECTOMY     BACK SURGERY     CARDIOVERSION  N/A 10/30/2022   Procedure: CARDIOVERSION;  Surgeon: [name], MD;  Location: [facility];  Service: Cardiovascular;  Laterality: N/A;   CHOLECYSTECTOMY N/A 1976   COLONOSCOPY     CORONARY ANGIOPLASTY     CORONARY STENT INTERVENTION N/A 01/04/2020   Procedure: CORONARY STENT INTERVENTION;  Surgeon: Iran Ouch, MD;  Location: [facility];  Service: Cardiovascular;  Laterality: N/A;  LAD    DIAPHRAGMATIC HERNIA REPAIR  2015   ESOPHAGEAL DILATION     multiple   ESOPHAGOGASTRODUODENOSCOPY (EGD) WITH PROPOFOL N/A 05/11/2021   Procedure: ESOPHAGOGASTRODUODENOSCOPY (EGD) WITH PROPOFOL;  Surgeon: [name], MD;  Location: [facility];  Service: Endoscopy;  Laterality: N/A;   ESOPHAGOGASTRODUODENOSCOPY (EGD) WITH PROPOFOL N/A 06/13/2021   Procedure: ESOPHAGOGASTRODUODENOSCOPY (EGD) WITH PROPOFOL;  Surgeon: [name], MD;  Location: [facility];  Service: Endoscopy;  Laterality: N/A;   EYE SURGERY     GASTRIC BYPASS  2000, 2005   Dr. Arlana Pouch  IMPLANT PLACEMENT  10/2011   Jeannette How IMPLANT PLACEMENT N/A 09/18/2021   Procedure: Leane Platt IMPLANT FIRST STAGE;  Surgeon: Alfredo Martinez, MD;  Location: [facility];  Service: Urology;  Laterality: N/A;   INTERSTIM IMPLANT PLACEMENT N/A 09/18/2021   Procedure: Leane Platt IMPLANT SECOND STAGE WITH IMPEDENCE CHECK;  Surgeon: Alfredo Martinez, MD;  Location: [facility];  Service: Urology;  Laterality: N/A;   INTERSTIM IMPLANT REMOVAL N/A 09/18/2021   Procedure: REMOVAL OF INTERSTIM IMPLANT;  Surgeon: Alfredo Martinez, MD;  Location: [facility];  Service: Urology;  Laterality: N/A;   LEFT HEART CATH AND CORONARY ANGIOGRAPHY N/A 04/1986   LEFT HEART CATH AND CORONARY ANGIOGRAPHY Left 07/24/2012   LEFT HEART CATH AND CORS/GRAFTS ANGIOGRAPHY N/A 12/31/2019   Procedure: LEFT HEART CATH AND CORS/GRAFTS ANGIOGRAPHY;  Surgeon: Antonieta Iba, MD;  Location: [facility];  Service: Cardiovascular;  Laterality: N/A;   PANNICULECTOMY N/A 06/16/2019   Procedure: PANNICULECTOMY;  Surgeon: Allena Napoleon, MD;  Location: [facility];  Service: Plastics;  Laterality: N/A;  3 hours, please   REVERSE SHOULDER ARTHROPLASTY Right 08/13/2017   Procedure: REVERSE RIGHT SHOULDER ARTHROPLASTY;  Surgeon: Teryl Lucy, MD;  Location: [facility];  Service: Orthopedics;  Laterality: Right;   RIGHT/LEFT HEART CATH AND CORONARY ANGIOGRAPHY N/A 09/06/2020   Procedure: RIGHT/LEFT HEART CATH AND CORONARY ANGIOGRAPHY;  Surgeon: [name], MD;  Location: [facility];  Service: Cardiovascular;  Laterality: N/A;   RIGHT/LEFT HEART CATH AND CORONARY ANGIOGRAPHY N/A 06/04/2022   Procedure: RIGHT/LEFT HEART CATH AND CORONARY ANGIOGRAPHY;  Surgeon: [name], MD;  Location: [facility];  Service: Cardiovascular;  Laterality: N/A;   ROTATOR CUFF REPAIR Right    SPINE SURGERY  1995   [place]   TOOTH EXTRACTION  11/13/2021   TOTAL ABDOMINAL HYSTERECTOMY W/ BILATERAL SALPINGOOPHORECTOMY  1974    TOTAL KNEE ARTHROPLASTY Bilateral 2007   Procedure: TOTAL KNEE ARTHROPLASTY; Surgeons: Gerrit Heck, MD and [name], MD   UMBILICAL HERNIA REPAIR  08/11/2015    Social History   Socioeconomic History   Marital status: Married    Spouse name: Alinda Money    Number of children: 2   Years of education: College    Highest education level: Not on file  Occupational History    Employer: retired  Tobacco Use   Smoking status: Never    Passive exposure: Never   Smokeless tobacco: Never  Vaping Use   Vaping Use: Never used  Substance and Sexual Activity   Alcohol use: No  Drug use: Not Currently    Comment: prescribed valium   Sexual activity: Not Currently    Birth control/protection: Post-menopausal, Surgical  Other Topics Concern   Not on file  Social History Narrative   Patient lives at home with husband Alinda Money.    Right Handed   Drinks caffeinated tea occasionally   One pet in house, dog   Social Determinants of Health   Financial Resource Strain: Low Risk  (09/15/2021)   Overall Financial Resource Strain (CARDIA)    Difficulty of Paying Living Expenses: Not hard at all  Food Insecurity: No Food Insecurity (10/19/2020)   Hunger Vital Sign    Worried About Running Out of Food in the Last Year: Never true    Ran Out of Food in the Last Year: Never true  Transportation Needs: No Transportation Needs (10/19/2020)   PRAPARE - Administrator, Civil Service (Medical): No    Lack of Transportation (Non-Medical): No  Physical Activity: Insufficiently Active (11/15/2020)   Exercise Vital Sign    Days of Exercise per Week: 2 days    Minutes of Exercise per Session: 40 min  Stress: Stress Concern Present (08/03/2020)   [employer] of Occupational Health - Occupational Stress Questionnaire    Feeling of Stress : Rather much  Social Connections: Not on file    Family History  Problem Relation Age of Onset   Heart disease Father    Hypertension Father    Prostate cancer Father     Stroke Father    Osteoporosis Father    Stroke Mother    Depression Mother    Headache Mother    Heart disease Mother    Thyroid disease Mother    Hypertension Mother    Diabetes Daughter    Heart disease Daughter    Hypertension Daughter    Hypertension Son     Outpatient Encounter Medications as of 12/06/2022  Medication Sig   albuterol (VENTOLIN HFA) 108 (90 Base) MCG/ACT inhaler Inhale 1-2 puffs into the lungs every 6 (six) hours as needed for wheezing or shortness of breath.   apixaban (ELIQUIS) 5 MG TABS tablet Take 1 tablet (5 mg total) by mouth 2 (two) times daily. Lot # [id number] Exp. 5/25   atorvastatin (LIPITOR) 80 MG tablet Take 1 tablet (80 mg total) by mouth daily.   benzonatate (TESSALON) 200 MG capsule Take 1 capsule (200 mg total) by mouth 2 (two) times daily as needed for cough.   butalbital-acetaminophen-caffeine (FIORICET) 50-325-40 MG tablet TAKE 1 TABLET BY MOUTH EVERY 6 HOURS AS NEEDED FOR HEADACHE.   Cholecalciferol (VITAMIN D3) 250 MCG (10000 UT) capsule Take 10,000 Units by mouth daily.   clotrimazole-betamethasone (LOTRISONE) cream Apply 1 application topically 2 (two) times daily as needed (irritation).   Continuous Blood Gluc Receiver (FREESTYLE LIBRE 2 READER) DEVI Use to check sugar at least TID   cyanocobalamin (,VITAMIN B-12,) 1000 MCG/ML injection Inject 1 mL (1,000 mcg total) into the muscle every 30 (thirty) days. Inject 1 ml (1000 mcg ) IM weekly x 4,  Then monthly thereafter   dapagliflozin propanediol (FARXIGA) 10 MG TABS tablet Take 1 tablet (10 mg total) by mouth daily.   dicyclomine (BENTYL) 20 MG tablet Take 1 tablet (20 mg total) by mouth 3 (three) times daily before meals.   DULoxetine (CYMBALTA) 30 MG capsule Take 1 capsule (30 mg total) by mouth daily.   ezetimibe (ZETIA) 10 MG tablet Take 1 tablet (10 mg total) by mouth daily.  fexofenadine (ALLEGRA) 180 MG tablet Take 180 mg by mouth as needed for allergies or rhinitis.   fluticasone  (FLONASE) 50 MCG/ACT nasal spray Place 2 sprays into both nostrils daily. (Patient taking differently: Place 2 sprays into both nostrils 3 (three) times daily as needed for allergies.)   Glucagon (GVOKE HYPOPEN 2-PACK) 0.5 MG/0.1ML SOAJ Inject 1 auto injector subcutaneously as needed for severe hypoglycemia. May repeat x 1 after 15 minutes if needed.   glucose blood (ONETOUCH VERIO) test strip Use to check blood sugar twice daily.   insulin degludec (TRESIBA FLEXTOUCH) 100 UNIT/ML FlexTouch Pen Inject 12 Units into the skin daily.   Insulin Pen Needle (DROPLET PEN NEEDLES) 31G X 5 MM MISC USE ONE NEEDLE SUBCUTANEOUSLY AS DIRECTED (REMOVE AND DISCARD NEEDLE IN SHARPS CONTAINER IMMEDIATELY AFTER USE)   loperamide (IMODIUM) 2 MG capsule Take 4-6 mg by mouth 3 (three) times daily before meals.   magnesium gluconate (MAGONATE) 500 MG tablet Take 500 mg by mouth daily.   metoprolol succinate (TOPROL-XL) 25 MG 24 hr tablet Take 0.5 tablets (12.5 mg total) by mouth daily. Take with or immediately following a meal.   nitroGLYCERIN (NITROSTAT) 0.4 MG SL tablet Place 1 tablet (0.4 mg total) under the tongue every 5 (five) minutes as needed for chest pain.   nystatin (MYCOSTATIN/NYSTOP) powder Apply 1 g topically 4 (four) times daily as needed (rash).   ondansetron (ZOFRAN-ODT) 4 MG disintegrating tablet Take 1 tablet (4 mg total) by mouth every 8 (eight) hours as needed for nausea or vomiting.   OneTouch Delica Lancets 33G MISC Used to check blood sugar two times a day.   OVER THE COUNTER MEDICATION Apply 1 application  topically daily as needed (pain). [place] Gel topical pain reliever   pantoprazole (PROTONIX) 40 MG tablet Take 1 tablet (40 mg total) by mouth daily.   Polyethyl Glycol-Propyl Glycol (SYSTANE OP) Place 1 drop into both eyes 4 (four) times daily as needed (dry eyes).   pregabalin (LYRICA) 50 MG capsule Take 1 capsule (50 mg total) by mouth 2 (two) times daily.   [DISCONTINUED] insulin aspart  (NOVOLOG FLEXPEN) 100 UNIT/ML FlexPen (Inject 4-16 units with meals per sliding scale; max daily dose 36 units) If pre-meal glucose <100, inject 4 units; 100-150 - inject 6 units, 151-200 - inject 8 units; 201-250 - inject 10 units; 251-300 - inject 12 units, 301-350 - inject 14 units ; 350 > inject 16 units);   conjugated estrogens (PREMARIN) vaginal cream Apply one pea-sized amount around the opening of the urethra daily for 2 weeks, then 3 times weekly moving forward. (Patient not taking: Reported on 11/27/2022)   insulin aspart (NOVOLOG FLEXPEN) 100 UNIT/ML FlexPen Inject 6-12 Units into the skin 3 (three) times daily with meals. (Inject 4-16 units with meals per sliding scale; max daily dose 36 units) If pre-meal glucose <100, inject 4 units; 100-150 - inject 6 units, 151-200 - inject 8 units; 201-250 - inject 10 units; 251-300 - inject 12 units, 301-350 - inject 14 units ; 350 > inject 16 units);   No facility-administered encounter medications on file as of 12/06/2022.    ALLERGIES: Allergies  Allergen Reactions   Demeclocycline Hives   Erythromycin Nausea And Vomiting and Other (See Comments)    Severe irritable bowel   Flagyl [Metronidazole] Nausea And Vomiting and Other (See Comments)    Severe irritable bowel   Glucophage [Metformin Hcl] Nausea And Vomiting and Other (See Comments)    "Sick" "I won't take anything that  has metformin in it"   Tetracyclines & Related Hives and Rash   Furosemide Swelling and Other (See Comments)    Other reaction(s): Localized superficial swelling of skin  Has opposite effect   Tetracycline Other (See Comments)   Diovan [Valsartan] Nausea Only        Sulfa Antibiotics Other (See Comments) and Rash    As child   Xanax [Alprazolam] Other (See Comments)    Hyperactivity     VACCINATION STATUS: Immunization History  Administered Date(s) Administered   Fluad Quad(high Dose 65+) 03/06/2019, 06/07/2020, 07/27/2021, 07/13/2022   Influenza Split  06/13/2011   Influenza, High Dose Seasonal PF 08/21/2018   Influenza,inj,Quad PF,6+ Mos 05/04/2014, 05/12/2015   Influenza-Unspecified 04/15/2012   Moderna Sars-Covid-2 Vaccination 02/02/2020, 03/01/2020, 09/01/2020   Pneumococcal Conjugate-13 08/11/2013, 01/06/2014   Pneumococcal Polysaccharide-23 01/19/2010, 09/03/2018   Tdap 04/18/2016, 11/20/2020    Diabetes She presents for her initial diabetic visit. She has type 2 diabetes mellitus. Onset time: Diagnosed at approx age of 21. Her disease course has been fluctuating. Hypoglycemia symptoms include dizziness, nervousness/anxiousness, pallor, sweats and tremors. (syncope) Associated symptoms include fatigue, foot paresthesias, polydipsia and polyuria. Hypoglycemia complications include blackouts and required assistance. Symptoms are stable. Diabetic complications include a CVA (incidental finding), heart disease, nephropathy and peripheral neuropathy. Risk factors for coronary artery disease include obesity, hypertension, diabetes mellitus, dyslipidemia, family history, sedentary lifestyle and post-menopausal. Current diabetic treatment includes intensive insulin program and oral agent (monotherapy) (Currently on Farxiga 10, Tresiba 12 units nightly, Novolog 4-16 units TID). She is compliant with treatment most of the time. Her weight is increasing steadily. She is following a generally unhealthy diet. When asked about meal planning, she reported none. She has not had a previous visit with a dietitian. She rarely participates in exercise. Her home blood glucose trend is fluctuating dramatically. Her overall blood glucose range is 180-200 mg/dl. (She presents today for her consultation, accompanied by her husband, with her CGM showing fluctuating glycemic profile.  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  Her POCT A1c today is 7.8%, increasing from last A1c of 7.1%.  Her diabetes medications dosages were just  recently changed by her PCP for severe hypoglycemia causing blackouts while she was shopping.  She drinks sweet tea, occasional soda, and eats generally 1 meal per day with snacks between.  She admits her sleeping pattern is off, will sleep late, and stay up late, and therefore does not have time to eat 3 meals.  She does not exercise optimally due to mobility deficits (has frequent falls-uses walker/cane).  She is UTD on eye exam, does see podiatry routinely.  Analysis of her CGM shows TIR 47%, TAR 33%, TBR 0%.  ) An ACE inhibitor/angiotensin II receptor blocker is not being taken. She sees a podiatrist.Eye exam is current.     Review of systems  Constitutional: + steadily increasing body weight, current Body mass index is 42.25 kg/m., + fatigue, no subjective hyperthermia, no subjective hypothermia Eyes: no blurry vision, no xerophthalmia ENT: no sore throat, no nodules palpated in throat, no dysphagia/odynophagia, no hoarseness Cardiovascular: no chest pain, no shortness of breath, no palpitations, + intermittent leg swelling Respiratory: no cough, no shortness of breath Gastrointestinal: no nausea/vomiting, + chronic diarrhea with fecal incontinence (says she will pass undigested food particles soon after eating meal) Musculoskeletal: no muscle/joint aches Skin: no rashes, no hyperemia Neurological: no tremors, no numbness, no tingling, no dizziness Psychiatric: no depression, no anxiety  Objective:  BP 118/66 (BP Location: Right Arm, Patient Position: Sitting, Cuff Size: Normal)   Pulse 73   Ht 5' 4.5" (1.638 m)   Wt 250 lb (113.4 kg)   BMI 42.25 kg/m   Wt Readings from Last 3 Encounters:  12/06/22 250 lb (113.4 kg)  11/27/22 248 lb (112.5 kg)  11/23/22 244 lb (110.7 kg)     BP Readings from Last 3 Encounters:  12/06/22 118/66  11/27/22 104/60  11/23/22 130/61     Physical Exam- Limited  Constitutional:  Body mass index is 42.25 kg/m. , not in acute distress, normal  state of mind Eyes:  EOMI, no exophthalmos Neck: Supple Cardiovascular: RRR, no murmurs, rubs, or gallops, no edema Respiratory: Adequate breathing efforts, no crackles, rales, rhonchi, or wheezing Musculoskeletal: no gross deformities, strength intact in all four extremities, no gross restriction of joint movements Skin:  no rashes, no hyperemia Neurological: no tremor with outstretched hands  Diabetic Foot Exam - Simple   No data filed     CMP ( most recent) CMP     Component Value Date/Time   NA 140 11/23/2022 1403   NA 145 (H) 05/12/2021 1126   NA 141 06/21/2014 1349   K 4.2 11/23/2022 1403   K 4.0 06/21/2014 1349   CL 104 11/23/2022 1403   CL 106 06/21/2014 1349   CO2 24 11/23/2022 1403   CO2 24 06/21/2014 1349   GLUCOSE 169 (H) 11/23/2022 1403   GLUCOSE 174 (H) 06/21/2014 1349   BUN 21 11/23/2022 1403   BUN 13 05/12/2021 1126   BUN 12 06/21/2014 1349   CREATININE 1.24 (H) 11/23/2022 1403   CREATININE 1.14 (H) 07/13/2022 1529   CALCIUM 9.0 11/23/2022 1403   CALCIUM 8.7 06/21/2014 1349   PROT 7.8 11/23/2022 1403   PROT 7.3 10/30/2012 1436   ALBUMIN 3.6 11/23/2022 1403   ALBUMIN 3.0 (L) 10/30/2012 1436   AST 20 11/23/2022 1403   AST 16 10/30/2012 1436   ALT 18 11/23/2022 1403   ALT 19 10/30/2012 1436   ALKPHOS 131 (H) 11/23/2022 1403   ALKPHOS 125 10/30/2012 1436   BILITOT 0.5 11/23/2022 1403   BILITOT 0.2 10/30/2012 1436   GFRNONAA 45 (L) 11/23/2022 1403   GFRNONAA 57 (L) 06/21/2014 1349   GFRNONAA 55 (L) 02/25/2014 2153   GFRNONAA 70 12/13/2011 0919   GFRAA >60 03/23/2020 1204   GFRAA >60 06/21/2014 1349   GFRAA >60 02/25/2014 2153   GFRAA 80 12/13/2011 0919     Diabetic Labs (most recent): Lab Results  Component Value Date   HGBA1C 7.8 (A) 12/06/2022   HGBA1C 7.1 (H) 07/13/2022   HGBA1C 7.7 (H) 02/09/2022   MICROALBUR <0.7 11/21/2021   MICROALBUR 2.8 (H) 06/04/2017   MICROALBUR 9.5 (H) 06/29/2016     Lipid Panel ( most recent) Lipid Panel      Component Value Date/Time   CHOL 103 10/23/2022 1324   CHOL 155 02/27/2014 0420   TRIG 98 10/23/2022 1324   TRIG 121 02/27/2014 0420   HDL 38 (L) 10/23/2022 1324   HDL 43 02/27/2014 0420   CHOLHDL 2.7 10/23/2022 1324   VLDL 20 10/23/2022 1324   VLDL 24 02/27/2014 0420   LDLCALC 45 10/23/2022 1324   LDLCALC 114 (H) 07/13/2022 1529   LDLCALC 88 02/27/2014 0420   LDLDIRECT 126 (H) 07/13/2022 1529      Lab Results  Component Value Date   TSH 2.887 09/27/2022   TSH 2.209 02/08/2021   TSH 3.695 07/17/2020  TSH 3.50 01/01/2019   TSH 2.279 08/20/2017   TSH 4.400 06/16/2015   TSH 1.16 05/03/2015   TSH 0.408 (L) 10/29/2014   TSH 5.099 (H) 08/13/2014   TSH 0.93 12/03/2013   FREET4 1.10 05/03/2015           Assessment & Plan:   1) Type 2 diabetes mellitus with hyperglycemia, with long-term current use of insulin (HCC)  She presents today for her consultation, accompanied by her husband, with her CGM showing fluctuating glycemic profile.  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  Her POCT A1c today is 7.8%, increasing from last A1c of 7.1%.  Her diabetes medications dosages were just recently changed by her PCP for severe hypoglycemia causing blackouts while she was shopping.  She drinks sweet tea, occasional soda, and eats generally 1 meal per day with snacks between.  She admits her sleeping pattern is off, will sleep late, and stay up late, and therefore does not have time to eat 3 meals.  She does not exercise optimally due to mobility deficits (has frequent falls-uses walker/cane).  She is UTD on eye exam, does see podiatry routinely.  Analysis of her CGM shows TIR 47%, TAR 33%, TBR 0%.  - Denise Sharp has currently uncontrolled symptomatic type 2 DM since 77 years of age, with most recent A1c of 7.8 %.   -Recent labs reviewed.  - I had a long discussion with her about the progressive nature of diabetes and the pathology behind  its complications. -her diabetes is complicated by CAD, CHF, Neuropathy, CKD stage 3, CVA and she remains at a high risk for more acute and chronic complications which include CAD, CVA, CKD, retinopathy, and neuropathy. These are all discussed in detail with her.  The following Lifestyle Medicine recommendations according to American College of Lifestyle Medicine [facility]) were discussed and offered to patient and she agrees to start the journey:  A. Whole Foods, Plant-based plate comprising of fruits and vegetables, plant-based proteins, whole-grain carbohydrates was discussed in detail with the patient.   A list for source of those nutrients were also provided to the patient.  Patient will use only water or unsweetened tea for hydration. B.  The need to stay away from risky substances including alcohol, smoking; obtaining 7 to 9 hours of restorative sleep, at least 150 minutes of moderate intensity exercise weekly, the importance of healthy social connections,  and stress reduction techniques were discussed. C.  A full color page of  Calorie density of various food groups per pound showing examples of each food groups was provided to the patient.  - I have counseled her on diet and weight management by adopting a carbohydrate restricted/protein rich diet. Patient is encouraged to switch to unprocessed or minimally processed complex starch and increased protein intake (animal or plant source), fruits, and vegetables. -  she is advised to stick to a routine mealtimes to eat 3 meals a day and avoid unnecessary snacks (to snack only to correct hypoglycemia).   - she acknowledges that there is a room for improvement in her food and drink choices. - Suggestion is made for her to avoid simple carbohydrates from her diet including Cakes, Sweet Desserts, Ice Cream, Soda (diet and regular), Sweet Tea, Candies, Chips, Cookies, Store Bought Juices, Alcohol in Excess of 1-2 drinks a day, Artificial Sweeteners, Coffee  Creamer, and "Sugar-free" Products. This will help patient to have more stable blood glucose profile and potentially avoid unintended weight  gain.  - I have approached her with the following individualized plan to manage her diabetes and patient agrees:   -She is advised to continue Tresiba 12 units SQ nightly.  I did adjust her Novolog slightly to 6-12 units TID with meals if glucose is above 90 and she is eating (Specific instructions on how to titrate insulin dosage based on glucose readings given to patient in writing).  She demonstrated her ability to use the SSI chart properly to dose meal time insulin today.  We talked about drinking less sweet tea, or at least only drinking it with her meals and nothing but water in between so the insulin has a chance to work properly  -she is encouraged to start/continue monitoring glucose 4 times daily (using her CGM), before meals and before bed, to log their readings on the clinic sheets provided, and bring them to review at follow up appointment in 3 months.  - she is warned not to take insulin without proper monitoring per orders. - Adjustment parameters are given to her for hypo and hyperglycemia in writing. - she is encouraged to call clinic for blood glucose levels less than 70 or above 300 mg /dl. - she is advised to continue Farxiga 10 mg po daily with breakfast, therapeutically suitable for patient given her CHF and CKD.  I did go over side effects to watch out for for this class of medications such as frequent UTIs, vaginal yeast infections, and yeast infection of the skin.  - she is not a candidate for Metformin due to concurrent renal insufficiency, nor did she tolerate it in the past.  She also did not tolerate GLP1 products due to nausea previously.  - Specific targets for  A1c; LDL, HDL, and Triglycerides were discussed with the patient.  2) Blood Pressure /Hypertension:  her blood pressure is controlled to target.   she is advised to  continue her current medications including Metoprolol 25 mg p.o. daily with breakfast.  3) Lipids/Hyperlipidemia:    Review of her recent lipid panel from 10/23/22 showed controlled LDL at 45 .  she is advised to continue Lipitor 80 mg daily at bedtime and Zetia 10 mg po daily.  Side effects and precautions discussed with her.  4)  Weight/Diet:  her Body mass index is 42.25 kg/m.  -  clearly complicating her diabetes care.   she is a candidate for weight loss. I discussed with her the fact that loss of 5 - 10% of her  current body weight will have the most impact on her diabetes management.  Exercise, and detailed carbohydrates information provided  -  detailed on discharge instructions.  She has had gastric bypass surgery x 2.  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  Recent imaging of abdomen (done for different reason) shows stable mass, still consistent with benignity.  She will not need additional follow up for this.  6) Chronic Care/Health Maintenance: -she is not on ACEI/ARB and is on Statin medications and is encouraged to initiate and continue to follow up with Ophthalmology, Dentist, Podiatrist at least yearly or according to recommendations, and advised to stay away from smoking. I have recommended yearly flu vaccine and pneumonia vaccine at least every 5 years; moderate intensity exercise for up to 150 minutes weekly; and sleep for at least 7 hours a day.  - she is advised to maintain close follow up with Sherlene Shams, MD for primary care needs, as well as  her other providers for optimal and coordinated care.  I do wonder if there could be a component of EPI affecting her digestion of foods.  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   - Time spent in this patient care: 60 min, of which > 50% was spent in counseling her about her diabetes and the rest reviewing her blood glucose logs, discussing her hypoglycemia and  hyperglycemia episodes, reviewing her current and previous labs/studies (including abstraction from other facilities) and medications doses and developing a long term treatment plan based on the latest standards of care/guidelines; and documenting her care.    Please refer to Patient Instructions for Blood Glucose Monitoring and Insulin/Medications Dosing Guide" in media tab for additional information. Please also refer to "Patient Self Inventory" in the Media tab for reviewed elements of pertinent patient history.  [name] participated in the discussions, expressed understanding, and voiced agreement with the above plans.  All questions were answered to her satisfaction. she is encouraged to contact clinic should she have any questions or concerns prior to her return visit.     Follow up plan: - Return in about 3 months (around 03/08/2023) for Diabetes F/U with A1c in office, No previsit labs, Bring meter and logs.    Ronny Bacon, [facility] [address] [place], [place] [address] Phone: [phone] Fax: [phone]  12/07/2022, 7:17 AM

## 2022-12-07 ENCOUNTER — Encounter: Payer: Self-pay | Admitting: Internal Medicine

## 2022-12-13 ENCOUNTER — Encounter: Payer: Self-pay | Admitting: *Deleted

## 2022-12-13 ENCOUNTER — Emergency Department: Payer: Medicare Other

## 2022-12-13 ENCOUNTER — Other Ambulatory Visit: Payer: Self-pay

## 2022-12-13 DIAGNOSIS — J90 Pleural effusion, not elsewhere classified: Secondary | ICD-10-CM | POA: Diagnosis not present

## 2022-12-13 DIAGNOSIS — R1010 Upper abdominal pain, unspecified: Secondary | ICD-10-CM | POA: Diagnosis not present

## 2022-12-13 DIAGNOSIS — I959 Hypotension, unspecified: Secondary | ICD-10-CM | POA: Diagnosis not present

## 2022-12-13 DIAGNOSIS — E119 Type 2 diabetes mellitus without complications: Secondary | ICD-10-CM | POA: Diagnosis not present

## 2022-12-13 DIAGNOSIS — I129 Hypertensive chronic kidney disease with stage 1 through stage 4 chronic kidney disease, or unspecified chronic kidney disease: Secondary | ICD-10-CM | POA: Diagnosis not present

## 2022-12-13 DIAGNOSIS — K219 Gastro-esophageal reflux disease without esophagitis: Secondary | ICD-10-CM | POA: Insufficient documentation

## 2022-12-13 DIAGNOSIS — R079 Chest pain, unspecified: Secondary | ICD-10-CM | POA: Diagnosis not present

## 2022-12-13 DIAGNOSIS — K760 Fatty (change of) liver, not elsewhere classified: Secondary | ICD-10-CM | POA: Diagnosis not present

## 2022-12-13 DIAGNOSIS — R918 Other nonspecific abnormal finding of lung field: Secondary | ICD-10-CM | POA: Diagnosis not present

## 2022-12-13 DIAGNOSIS — N189 Chronic kidney disease, unspecified: Secondary | ICD-10-CM | POA: Insufficient documentation

## 2022-12-13 DIAGNOSIS — R0789 Other chest pain: Secondary | ICD-10-CM | POA: Diagnosis not present

## 2022-12-13 DIAGNOSIS — J9811 Atelectasis: Secondary | ICD-10-CM | POA: Diagnosis not present

## 2022-12-13 LAB — TROPONIN I (HIGH SENSITIVITY): Troponin I (High Sensitivity): 7 ng/L (ref <18)

## 2022-12-13 LAB — CBC
HCT: 35.8 % — ABNORMAL LOW (ref 36.0–46.0)
Hemoglobin: 11 g/dL — ABNORMAL LOW (ref 12.0–15.0)
MCH: 30.7 pg (ref 26.0–34.0)
MCHC: 30.7 g/dL (ref 30.0–36.0)
MCV: 100 fL (ref 80.0–100.0)
Platelets: 224 10*3/uL (ref 150–400)
RBC: 3.58 MIL/uL — ABNORMAL LOW (ref 3.87–5.11)
RDW: 15.6 % — ABNORMAL HIGH (ref 11.5–15.5)
WBC: 12.2 10*3/uL — ABNORMAL HIGH (ref 4.0–10.5)
nRBC: 0 % (ref 0.0–0.2)

## 2022-12-13 NOTE — ED Triage Notes (Addendum)
Pt brought in via ems from home.  Pt was lying in bed tonight and began having chest pain.  Pt took ntg tonight without relief.  Pt reports a headache.  Pt alert  speech clear.  Iv in place

## 2022-12-13 NOTE — ED Triage Notes (Signed)
FIRST NURSE NOTE:  Pt arrived via [facility] from home with c/o CP that started around 20:15, pain with deep breath, L arm pain, felt like she ran a mile, took 2 NTG at home, pain improved, cardioverted 1 month ago out of afib  P 65 NSR 2NTG sprays with EMS  Pain 4/10  104/33  20G R FA NS 500cc with EMS.

## 2022-12-14 ENCOUNTER — Emergency Department: Payer: Medicare Other

## 2022-12-14 ENCOUNTER — Emergency Department
Admission: EM | Admit: 2022-12-14 | Discharge: 2022-12-14 | Disposition: A | Discharge status: Home / Self Care | Payer: Medicare Other | Attending: Emergency Medicine | Admitting: Emergency Medicine

## 2022-12-14 ENCOUNTER — Encounter: Payer: Self-pay | Admitting: Internal Medicine

## 2022-12-14 DIAGNOSIS — K2100 Gastro-esophageal reflux disease with esophagitis, without bleeding: Secondary | ICD-10-CM

## 2022-12-14 DIAGNOSIS — K760 Fatty (change of) liver, not elsewhere classified: Secondary | ICD-10-CM | POA: Diagnosis not present

## 2022-12-14 DIAGNOSIS — J9811 Atelectasis: Secondary | ICD-10-CM | POA: Diagnosis not present

## 2022-12-14 DIAGNOSIS — R1010 Upper abdominal pain, unspecified: Secondary | ICD-10-CM

## 2022-12-14 DIAGNOSIS — R918 Other nonspecific abnormal finding of lung field: Secondary | ICD-10-CM | POA: Diagnosis not present

## 2022-12-14 DIAGNOSIS — K219 Gastro-esophageal reflux disease without esophagitis: Secondary | ICD-10-CM | POA: Diagnosis not present

## 2022-12-14 DIAGNOSIS — J90 Pleural effusion, not elsewhere classified: Secondary | ICD-10-CM | POA: Diagnosis not present

## 2022-12-14 LAB — BASIC METABOLIC PANEL
Anion gap: 9 (ref 5–15)
BUN: 16 mg/dL (ref 8–23)
CO2: 23 mmol/L (ref 22–32)
Calcium: 8.5 mg/dL — ABNORMAL LOW (ref 8.9–10.3)
Chloride: 106 mmol/L (ref 98–111)
Creatinine, Ser: 1.23 mg/dL — ABNORMAL HIGH (ref 0.44–1.00)
GFR, Estimated: 45 mL/min — ABNORMAL LOW (ref >60)
Glucose, Bld: 147 mg/dL — ABNORMAL HIGH (ref 70–99)
Potassium: 4.8 mmol/L (ref 3.5–5.1)
Sodium: 138 mmol/L (ref 135–145)

## 2022-12-14 LAB — HEPATIC FUNCTION PANEL
ALT: 22 U/L (ref 0–44)
AST: 25 U/L (ref 15–41)
Albumin: 3.3 g/dL — ABNORMAL LOW (ref 3.5–5.0)
Alkaline Phosphatase: 127 U/L — ABNORMAL HIGH (ref 38–126)
Bilirubin, Direct: 0.1 mg/dL (ref 0.0–0.2)
Indirect Bilirubin: 0.4 mg/dL (ref 0.3–0.9)
Total Bilirubin: 0.5 mg/dL (ref 0.3–1.2)
Total Protein: 7 g/dL (ref 6.5–8.1)

## 2022-12-14 LAB — PROTIME-INR
INR: 1.3 — ABNORMAL HIGH (ref 0.8–1.2)
Prothrombin Time: 16.2 seconds — ABNORMAL HIGH (ref 11.4–15.2)

## 2022-12-14 LAB — TROPONIN I (HIGH SENSITIVITY): Troponin I (High Sensitivity): 10 ng/L (ref <18)

## 2022-12-14 LAB — LIPASE, BLOOD: Lipase: 34 U/L (ref 11–51)

## 2022-12-14 MED ORDER — METOCLOPRAMIDE HCL 10 MG PO TABS: 10.0000 mg | ORAL_TABLET | Freq: Four times a day (QID) | ORAL | 0 refills | Status: DC | PRN

## 2022-12-14 MED ORDER — SUCRALFATE 1 G PO TABS: 1.0000 g | ORAL_TABLET | Freq: Four times a day (QID) | ORAL | 1 refills | Status: DC

## 2022-12-14 MED ORDER — IOHEXOL 350 MG/ML SOLN
75.0000 mL | Freq: Once | INTRAVENOUS | Status: AC | PRN
  Administered 2022-12-14: 75 mL via INTRAVENOUS

## 2022-12-14 NOTE — ED Provider Notes (Signed)
[facility] Provider Note    Event Date/Time   First MD Initiated Contact with Patient 12/14/22 [phone]     (approximate)   History   Chief Complaint: Chest Pain   HPI  Denise Sharp is a 77 y.o. female with a history of hypertension IBS diabetes CKD who comes the ED complaining of chest pain.  For the location of the pain she indicates her upper abdomen.  She tried taking 2 nitroglycerin at home without relief.  Symptoms occurred at rest while lying in bed reading.  Nonradiating.  No shortness of breath diaphoresis or vomiting.  She called EMS who gave her 2 additional nitroglycerin.  Pain was initially 7/10, by arrival in the ED pain had decreased to 1/10.  She reports still having mild ongoing pain which is described as dull ache.  She also reports that it is painful to take a deep breath.     Physical Exam   Triage Vital Signs: ED Triage Vitals  Enc Vitals Group     BP 12/13/22 2216 112/62     Pulse Rate 12/13/22 2216 (!) 55     Resp 12/13/22 2216 18     Temp 12/13/22 2345 98.9 F (37.2 C)     Temp Source 12/13/22 2216 Oral     SpO2 12/13/22 2216 97 %     Weight 12/13/22 2216 247 lb (112 kg)     Height 12/13/22 2216 5\' 4"  (1.626 m)     Head Circumference --      Peak Flow --      Pain Score 12/13/22 2216 6     Pain Loc --      Pain Edu? --      Excl. in GC? --     Most recent vital signs: Vitals:   12/13/22 2345 12/14/22 0539  BP: 117/63 113/68  Pulse: (!) 103 62  Resp: 20 17  Temp: 98.9 F (37.2 C)   SpO2: 96% 100%    General: Awake, no distress.  CV:  Good peripheral perfusion.  Regular rate and rhythm Resp:  Normal effort.  Clear to auscultation bilaterally Abd:  No distention.  Soft with diffuse upper abdominal tenderness reproducing her pain.  No peritoneal signs Other:  Moist oral mucosa   ED Results / Procedures / Treatments   Labs (all labs ordered are listed, but only abnormal results are displayed) Labs Reviewed   CBC - Abnormal; Notable for the following components:      Result Value   WBC 12.2 (*)    RBC 3.58 (*)    Hemoglobin 11.0 (*)    HCT 35.8 (*)    RDW 15.6 (*)    All other components within normal limits  PROTIME-INR - Abnormal; Notable for the following components:   Prothrombin Time 16.2 (*)    INR 1.3 (*)    All other components within normal limits  BASIC METABOLIC PANEL - Abnormal; Notable for the following components:   Glucose, Bld 147 (*)    Creatinine, Ser 1.23 (*)    Calcium 8.5 (*)    GFR, Estimated 45 (*)    All other components within normal limits  HEPATIC FUNCTION PANEL - Abnormal; Notable for the following components:   Albumin 3.3 (*)    Alkaline Phosphatase 127 (*)    All other components within normal limits  LIPASE, BLOOD  TROPONIN I (HIGH SENSITIVITY)  TROPONIN I (HIGH SENSITIVITY)     EKG Interpreted by me Sinus  bradycardia rate of 56.  Normal axis, normal intervals.  Poor R wave progression.  Normal ST segments and T waves.   RADIOLOGY Chest x-ray interpreted by me, appears normal.  Radiology report reviewed.   PROCEDURES:  Procedures   MEDICATIONS ORDERED IN ED: Medications  iohexol (OMNIPAQUE) 350 MG/ML injection 75 mL (75 mLs Intravenous Contrast Given 12/14/22 0523)     IMPRESSION / MDM / ASSESSMENT AND PLAN / ED COURSE  I reviewed the triage vital signs and the nursing notes.  DDx: Pancreatitis, choledocholithiasis, gastritis, non-STEMI, pulmonary embolism, colitis, bowel obstruction  Patient's presentation is most consistent with acute presentation with potential threat to life or bodily function.  Patient presents with upper abdominal pain with tenderness.  Vital signs are unremarkable.  Lab workup negative.  Will obtain CT chest abdomen pelvis to evaluate for occult pulmonary pathology such as pneumonia or pneumothorax, rule out PE, eval for possible SBO or colitis.   ----------------------------------------- 6:46 AM on  12/14/2022 ----------------------------------------- CT overall unremarkable.  No evidence of PE or other acute findings.  There is a finding of esophageal thickening which may be related to GERD and esophagitis as the cause of symptoms.  She sees Dr. Servando Snare for gastroenterology and had upper endoscopy in 2022.  Encouraged her to follow-up with Dr. Servando Snare again.  Stable for discharge      FINAL CLINICAL IMPRESSION(S) / ED DIAGNOSES   Final diagnoses:  Pain of upper abdomen  Gastroesophageal reflux disease with esophagitis without hemorrhage     Rx / DC Orders   ED Discharge Orders          Ordered    sucralfate (CARAFATE) 1 g tablet  4 times daily        12/14/22 0645    metoCLOPramide (REGLAN) 10 MG tablet  Every 6 hours PRN        12/14/22 0645             Note:  This document was prepared using Dragon voice recognition software and may include unintentional dictation errors.   Sharman Cheek, MD 12/14/22 [phone]

## 2022-12-19 ENCOUNTER — Telehealth: Payer: Self-pay

## 2022-12-19 NOTE — Telephone Encounter (Signed)
Transition Care Management Unsuccessful Follow-up Telephone Call  Date of discharge and from where:  12/14/2022 [facility]  Attempts:  2nd Attempt  Reason for unsuccessful TCM follow-up call:  Left voice message  Denise Sharp [place]  [facility] Population Health Community Resource Care Guide   [email or web address]  ?? [phone]   Website: [email or web address]  [email or web address]      

## 2022-12-19 NOTE — Telephone Encounter (Signed)
Transition Care Management Unsuccessful Follow-up Telephone Call  Date of discharge and from where:  12/14/2022 [facility]  Attempts:  1st Attempt  Reason for unsuccessful TCM follow-up call:  Left voice message  Olivia Pavelko [facility]  [facility] Population Health Community Resource Care Guide   [email or web address]  ?? [phone]   Website: [email or web address]  [email or web address]

## 2022-12-25 ENCOUNTER — Ambulatory Visit (INDEPENDENT_AMBULATORY_CARE_PROVIDER_SITE_OTHER): Payer: Medicare Other | Admitting: Internal Medicine

## 2022-12-25 ENCOUNTER — Encounter: Payer: Self-pay | Admitting: Internal Medicine

## 2022-12-25 ENCOUNTER — Other Ambulatory Visit: Payer: Self-pay | Admitting: Internal Medicine

## 2022-12-25 VITALS — BP 124/58 | HR 74 | Temp 98.9°F | Ht 64.0 in | Wt 253.6 lb

## 2022-12-25 DIAGNOSIS — E1149 Type 2 diabetes mellitus with other diabetic neurological complication: Secondary | ICD-10-CM | POA: Diagnosis not present

## 2022-12-25 DIAGNOSIS — R635 Abnormal weight gain: Secondary | ICD-10-CM

## 2022-12-25 DIAGNOSIS — N183 Chronic kidney disease, stage 3 unspecified: Secondary | ICD-10-CM | POA: Diagnosis not present

## 2022-12-25 DIAGNOSIS — Z794 Long term (current) use of insulin: Secondary | ICD-10-CM | POA: Diagnosis not present

## 2022-12-25 DIAGNOSIS — E1122 Type 2 diabetes mellitus with diabetic chronic kidney disease: Secondary | ICD-10-CM

## 2022-12-25 DIAGNOSIS — Z9884 Bariatric surgery status: Secondary | ICD-10-CM

## 2022-12-25 DIAGNOSIS — E1142 Type 2 diabetes mellitus with diabetic polyneuropathy: Secondary | ICD-10-CM | POA: Diagnosis not present

## 2022-12-25 MED ORDER — NOVOLOG FLEXPEN 100 UNIT/ML ~~LOC~~ SOPN: 10.0000 [IU] | PEN_INJECTOR | Freq: Three times a day (TID) | SUBCUTANEOUS | 3 refills | Status: DC

## 2022-12-25 MED ORDER — CYANOCOBALAMIN 1000 MCG/ML IJ SOLN: 1000.0000 ug | INTRAMUSCULAR | 1 refills | Status: DC

## 2022-12-25 NOTE — Patient Instructions (Addendum)
Increase the Tresiba to 12 units   Increase the baseline mealtime insulin from  6 to 10 units and add 2 extra units for every bump in sugar of 50 pts )      PLEASE PLEASE PLEASE  consider a colostomy  to enable you to be more active and not confined to the house by your diarrhea,

## 2022-12-25 NOTE — Progress Notes (Signed)
+  Subjective:  Patient ID: Denise Sharp, female    DOB: 07/12/1946  Age: 77 y.o. MRN: [id number]  CC: The primary encounter diagnosis was Type II diabetes mellitus with neurological manifestations (HCC). Diagnoses of Diabetic polyneuropathy associated with type 2 diabetes mellitus (HCC), CKD stage 3 due to type 2 diabetes mellitus (HCC), Weight gain following gastric bypass surgery, and Morbid obesity (HCC) were also pertinent to this visit.   HPI Denise Sharp presents for  Chief Complaint  Patient presents with   Medical Management of Chronic Issues   Cc:  "I''ve lost complete control of my bowels "  she reports chronic diarrhea and persistent fecal incontinence even with solid stools,  but  she has been alternating between severe constipation and diarrhea  for several weeks. Has not contacted  Dr Servando Snare.    Taking probiotics .  Using imodium prn but afraid to use it due to fear of constipation. .  She has a spinal cord stimulator,  diabetic neuropathy.  Last colonoscopy 2015 reviewed: internal nonbleeding hemorrhoids,  no polyps. Biopsies done randomly were "  NEGATIVE FOR INTRAEPITHELIAL LYMPHOCYTOSIS, INCREASED SUBEPITHELIAL COLLAGEN, VIRAL CYTOPATHIC EFFECT, DYSPLASIA AND  MALIGNANCY. "  2) Type 2 DM with neuropathy : uncontrolled based on review of her  CBG monitor .  Diet  reviewed:  ate  cereal (Honey bunches of oats) and banana for breakfast today around 10 am ,  her sugar spiked to 380 despite taking 6 units of novolog tid plus sliding scale added.  She admits that she is not following a diabetic diet because "I am overwhelmed"  by her health problems   trying to follow the FODMAP diet rec by Servando Snare  for her IBS symptoms   3) Morbid obesity:   Gaining weight despite eating "nearly nothing" .  Does not leave bedroom for weeks at at a time due to diarrhea and neuropathic foot pain. History of remote gastric  bypass,     Outpatient Medications Prior to Visit  Medication Sig Dispense  Refill   albuterol (VENTOLIN HFA) 108 (90 Base) MCG/ACT inhaler Inhale 1-2 puffs into the lungs every 6 (six) hours as needed for wheezing or shortness of breath. 18 g 2   apixaban (ELIQUIS) 5 MG TABS tablet Take 1 tablet (5 mg total) by mouth 2 (two) times daily. Lot # [id number] Exp. 5/25 28 tablet 0   atorvastatin (LIPITOR) 80 MG tablet Take 1 tablet (80 mg total) by mouth daily. 90 tablet 1   benzonatate (TESSALON) 200 MG capsule Take 1 capsule (200 mg total) by mouth 2 (two) times daily as needed for cough. 20 capsule 0   butalbital-acetaminophen-caffeine (FIORICET) 50-325-40 MG tablet TAKE 1 TABLET BY MOUTH EVERY 6 HOURS AS NEEDED FOR HEADACHE. 60 tablet 2   Cholecalciferol (VITAMIN D3) 250 MCG (10000 UT) capsule Take 10,000 Units by mouth daily.     clotrimazole-betamethasone (LOTRISONE) cream Apply 1 application topically 2 (two) times daily as needed (irritation).     conjugated estrogens (PREMARIN) vaginal cream Apply one pea-sized amount around the opening of the urethra daily for 2 weeks, then 3 times weekly moving forward. 30 g 4   Continuous Blood Gluc Receiver (FREESTYLE LIBRE 2 READER) DEVI Use to check sugar at least TID 1 each 1   dapagliflozin propanediol (FARXIGA) 10 MG TABS tablet Take 1 tablet (10 mg total) by mouth daily. 90 tablet 3   dicyclomine (BENTYL) 20 MG tablet Take 1 tablet (20 mg total) by mouth 3 (  three) times daily before meals. 180 tablet 2   DULoxetine (CYMBALTA) 30 MG capsule Take 1 capsule (30 mg total) by mouth daily. 90 capsule 4   ezetimibe (ZETIA) 10 MG tablet Take 1 tablet (10 mg total) by mouth daily. 90 tablet 3   fexofenadine (ALLEGRA) 180 MG tablet Take 180 mg by mouth as needed for allergies or rhinitis.     fluticasone (FLONASE) 50 MCG/ACT nasal spray Place 2 sprays into both nostrils daily. (Patient taking differently: Place 2 sprays into both nostrils 3 (three) times daily as needed for allergies.) 16 g 6   Glucagon (GVOKE HYPOPEN 2-PACK) 0.5 MG/0.1ML  SOAJ Inject 1 auto injector subcutaneously as needed for severe hypoglycemia. May repeat x 1 after 15 minutes if needed. 0.1 mL 2   glucose blood (ONETOUCH VERIO) test strip Use to check blood sugar twice daily. 100 each 1   insulin degludec (TRESIBA FLEXTOUCH) 100 UNIT/ML FlexTouch Pen Inject 12 Units into the skin daily. 15 mL 3   Insulin Pen Needle (DROPLET PEN NEEDLES) 31G X 5 MM MISC USE ONE NEEDLE SUBCUTANEOUSLY AS DIRECTED (REMOVE AND DISCARD NEEDLE IN SHARPS CONTAINER IMMEDIATELY AFTER USE) 100 each 1   loperamide (IMODIUM) 2 MG capsule Take 4-6 mg by mouth 3 (three) times daily before meals.     magnesium gluconate (MAGONATE) 500 MG tablet Take 500 mg by mouth daily.     metoCLOPramide (REGLAN) 10 MG tablet Take 1 tablet (10 mg total) by mouth every 6 (six) hours as needed. 30 tablet 0   metoprolol succinate (TOPROL-XL) 25 MG 24 hr tablet Take 0.5 tablets (12.5 mg total) by mouth daily. Take with or immediately following a meal. 45 tablet 3   nitroGLYCERIN (NITROSTAT) 0.4 MG SL tablet Place 1 tablet (0.4 mg total) under the tongue every 5 (five) minutes as needed for chest pain. 25 tablet 0   nystatin (MYCOSTATIN/NYSTOP) powder Apply 1 g topically 4 (four) times daily as needed (rash).     ondansetron (ZOFRAN-ODT) 4 MG disintegrating tablet Take 1 tablet (4 mg total) by mouth every 8 (eight) hours as needed for nausea or vomiting. 30 tablet 0   OneTouch Delica Lancets 33G MISC Used to check blood sugar two times a day. 100 each 4   OVER THE COUNTER MEDICATION Apply 1 application  topically daily as needed (pain). [place] Gel topical pain reliever     pantoprazole (PROTONIX) 40 MG tablet Take 1 tablet (40 mg total) by mouth daily. 90 tablet 2   Polyethyl Glycol-Propyl Glycol (SYSTANE OP) Place 1 drop into both eyes 4 (four) times daily as needed (dry eyes).     pregabalin (LYRICA) 50 MG capsule Take 1 capsule (50 mg total) by mouth 2 (two) times daily. 60 capsule 2   sucralfate (CARAFATE) 1 g  tablet Take 1 tablet (1 g total) by mouth 4 (four) times daily. 120 tablet 1   cyanocobalamin (,VITAMIN B-12,) 1000 MCG/ML injection Inject 1 mL (1,000 mcg total) into the muscle every 30 (thirty) days. Inject 1 ml (1000 mcg ) IM weekly x 4,  Then monthly thereafter 3 mL 1   insulin aspart (NOVOLOG FLEXPEN) 100 UNIT/ML FlexPen Inject 6-12 Units into the skin 3 (three) times daily with meals. (Inject 4-16 units with meals per sliding scale; max daily dose 36 units) If pre-meal glucose <100, inject 4 units; 100-150 - inject 6 units, 151-200 - inject 8 units; 201-250 - inject 10 units; 251-300 - inject 12 units, 301-350 - inject 14 units ;  350 > inject 16 units); 45 mL 3   No facility-administered medications prior to visit.    Review of Systems;  Patient denies headache, fevers, malaise, unintentional weight loss, skin rash, eye pain, sinus congestion and sinus pain, sore throat, dysphagia,  hemoptysis , cough, dyspnea, wheezing, chest pain, palpitations, orthopnea, edema, abdominal pain, nausea, melena, diarrhea, constipation, flank pain, dysuria, hematuria, urinary  Frequency, nocturia, numbness, tingling, seizures,  Focal weakness, Loss of consciousness,  Tremor, insomnia, depression, anxiety, and suicidal ideation.      Objective:  BP (!) 124/58   Pulse 74   Temp 98.9 F (37.2 C) (Oral)   Ht 5\' 4"  (1.626 m)   Wt 253 lb 9.6 oz (115 kg)   SpO2 95%   BMI 43.53 kg/m   BP Readings from Last 3 Encounters:  12/25/22 (!) 124/58  12/14/22 (!) 106/56  12/06/22 118/66    Wt Readings from Last 3 Encounters:  12/25/22 253 lb 9.6 oz (115 kg)  12/13/22 247 lb (112 kg)  12/06/22 250 lb (113.4 kg)    Physical Exam Vitals reviewed.  Constitutional:      General: She is not in acute distress.    Appearance: Normal appearance. She is obese. She is not ill-appearing, toxic-appearing or diaphoretic.  HENT:     Head: Normocephalic.  Eyes:     General: No scleral icterus.       Right eye: No  discharge.        Left eye: No discharge.     Conjunctiva/sclera: Conjunctivae normal.  Cardiovascular:     Rate and Rhythm: Normal rate and regular rhythm.     Heart sounds: Normal heart sounds.  Pulmonary:     Effort: Pulmonary effort is normal. No respiratory distress.     Breath sounds: Normal breath sounds.  Abdominal:     General: Bowel sounds are normal.     Palpations: Abdomen is soft.     Comments: Pickwickian body habitus , large pannus   Musculoskeletal:        General: Normal range of motion.  Skin:    General: Skin is warm and dry.  Neurological:     General: No focal deficit present.     Mental Status: She is alert and oriented to person, place, and time. Mental status is at baseline.  Psychiatric:        Mood and Affect: Mood normal.        Behavior: Behavior normal.        Thought Content: Thought content normal.        Judgment: Judgment normal.    Lab Results  Component Value Date   HGBA1C 7.8 (A) 12/06/2022   HGBA1C 7.1 (H) 07/13/2022   HGBA1C 7.7 (H) 02/09/2022    Lab Results  Component Value Date   CREATININE 1.23 (H) 12/13/2022   CREATININE 1.24 (H) 11/23/2022   CREATININE 1.24 (H) 10/23/2022    Lab Results  Component Value Date   WBC 12.2 (H) 12/13/2022   HGB 11.0 (L) 12/13/2022   HCT 35.8 (L) 12/13/2022   PLT 224 12/13/2022   GLUCOSE 147 (H) 12/13/2022   CHOL 103 10/23/2022   TRIG 98 10/23/2022   HDL 38 (L) 10/23/2022   LDLDIRECT 126 (H) 07/13/2022   LDLCALC 45 10/23/2022   ALT 22 12/14/2022   AST 25 12/14/2022   NA 138 12/13/2022   K 4.8 12/13/2022   CL 106 12/13/2022   CREATININE 1.23 (H) 12/13/2022   BUN 16 12/13/2022  CO2 23 12/13/2022   TSH 2.887 09/27/2022   INR 1.3 (H) 12/14/2022   HGBA1C 7.8 (A) 12/06/2022   MICROALBUR <0.7 11/21/2021    CT Angio Chest PE W and/or Wo Contrast  Result Date: 12/14/2022 CLINICAL DATA:  Chest and abdominal pain. EXAM: CT ANGIOGRAPHY CHEST CT ABDOMEN AND PELVIS WITH CONTRAST TECHNIQUE:  Multidetector CT imaging of the chest was performed using the standard protocol during bolus administration of intravenous contrast. Multiplanar CT image reconstructions and MIPs were obtained to evaluate the vascular anatomy. Multidetector CT imaging of the abdomen and pelvis was performed using the standard protocol during bolus administration of intravenous contrast. RADIATION DOSE REDUCTION: This exam was performed according to the departmental dose-optimization program which includes automated exposure control, adjustment of the mA and/or kV according to patient size and/or use of iterative reconstruction technique. CONTRAST:  75mL OMNIPAQUE IOHEXOL 350 MG/ML SOLN COMPARISON:  PA and lateral chest yesterday, PA and lateral chest 10/23/2021, chest CT with contrast 02/08/2021, abdomen and pelvis CT with contrast 07/25/2022 and 06/27/2021. FINDINGS: CTA CHEST FINDINGS Cardiovascular: There is mild panchamber cardiomegaly increased from 2022. There are calcifications in the mitral ring, and three-vessel coronary calcifications greatest in the LAD. There is no pericardial effusion. There is mild aortic tortuosity, scattered plaques in the great vessels. There is no aortic aneurysm, dissection or stenosis. The descending segment is mildly tortuous. The pulmonary arteries are normal in caliber and do not show evidence of thromboemboli. There is no venous distention. Mediastinum/Nodes: No enlarged mediastinal, hilar, or axillary lymph nodes. Thyroid gland and trachea demonstrate no significant findings. There is increased lipomatosis in the superior mediastinum. There is increased moderate thickening in the distal thoracic esophagus. Endoscopic follow-up recommended. Lungs/Pleura: There are trace pleural effusions. There is increased diffuse bronchial thickening. There is increased mosaic attenuation throughout the lung fields consistent with air trapping and small airways disease. There is increased linear atelectasis  in the left upper lobe lingula. No confluent pneumonia is seen. There is a 1 cm ground-glass nodule again noted in the left upper lobe on 3:27, and medial to this an 8 mm ground-glass nodule on 3:26. These are both unchanged dating back to a CTA chest from 08/20/2017 therefore do not require further follow-up. No other lung nodules are seen, no other significant findings. Musculoskeletal: Multilevel thoracic spine bridging enthesopathy and degenerative disc disease. No aggressive or acute bone lesions. Right shoulder arthroplasty is again noted. The visualized chest wall without evidence of masses. Review of the MIP images confirms the above findings. CT ABDOMEN and PELVIS FINDINGS Hepatobiliary: 18 cm liver length. Mild hepatic steatosis. No mass enhancement. Again noted surgical absence of gallbladder without biliary dilatation. Pancreas: Moderately atrophic and otherwise unremarkable. Spleen: No abnormality. Adrenals/Urinary Tract: Chronic and stable 1.5 cm left adrenal genu nodule. No follow-up imaging recommended. No right adrenal mass is seen. The renal cortex is unremarkable. There are small parapelvic renal cysts, also unchanged. There is no urinary stone or obstruction. There is no bladder thickening. Stomach/Bowel: Her marked gastric bypass and hiatal hernia repair. No bowel obstruction or inflammation. An appendix is not seen in this patient. Vascular/Lymphatic: Aortic atherosclerosis. No enlarged abdominal or pelvic lymph nodes. Reproductive: Status post hysterectomy. No adnexal masses. Multiple pelvic phleboliths. Other: Small umbilical fat hernia. No new or incarcerated hernia. No free fluid or free air. There is chronic stranding in the low anterior pelvic wall most likely due to remote trauma or prior surgery. Musculoskeletal: Osteopenia with advanced degenerative change of the lumbar spine.  Again noted is a sacral stimulator implanted in the left flank with the wire extending through the right S3  foramen. There is no aggressive osseous lesion. 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 No acute or other significant findings. Review of the MIP images confirms the above findings. IMPRESSION: 1. No evidence of arterial dilatation or embolus. 2. Aortic and coronary artery atherosclerosis. 3. Cardiomegaly without evidence of CHF. 4. Increased diffuse bronchial thickening and mosaic attenuation, the latter consistent with small airways disease and air trapping. No confluent pneumonia. 5. Stable left upper lobe ground-glass nodules x 5 + years. No follow-up imaging recommended. 6. Increased moderate thickening in the distal thoracic esophagus. Endoscopic follow-up recommended. 7. No acute findings in the abdomen or pelvis. 8. Chronic and stable 1.5 cm left adrenal nodule. No follow-up imaging recommended. 9. Osteopenia and degenerative change. 10. Additional chronic findings detailed above. Electronically Signed   By: Almira Bar M.D.   On: 12/14/2022 06:29   CT ABDOMEN PELVIS W CONTRAST  Result Date: 12/14/2022 CLINICAL DATA:  Chest and abdominal pain. EXAM: CT ANGIOGRAPHY CHEST CT ABDOMEN AND PELVIS WITH CONTRAST TECHNIQUE: Multidetector CT imaging of the chest was performed using the standard protocol during bolus administration of intravenous contrast. Multiplanar CT image reconstructions and MIPs were obtained to evaluate the vascular anatomy. Multidetector CT imaging of the abdomen and pelvis was performed using the standard protocol during bolus administration of intravenous contrast. RADIATION DOSE REDUCTION: This exam was performed according to the departmental dose-optimization program which includes automated exposure control, adjustment of the mA and/or kV according to patient size and/or use of iterative reconstruction technique. CONTRAST:  75mL OMNIPAQUE IOHEXOL 350 MG/ML SOLN COMPARISON:  PA and lateral chest yesterday, PA and lateral chest 10/23/2021, chest CT with contrast 02/08/2021, abdomen and pelvis CT  with contrast 07/25/2022 and 06/27/2021. FINDINGS: CTA CHEST FINDINGS Cardiovascular: There is mild panchamber cardiomegaly increased from 2022. There are calcifications in the mitral ring, and three-vessel coronary calcifications greatest in the LAD. There is no pericardial effusion. There is mild aortic tortuosity, scattered plaques in the great vessels. There is no aortic aneurysm, dissection or stenosis. The descending segment is mildly tortuous. The pulmonary arteries are normal in caliber and do not show evidence of thromboemboli. There is no venous distention. Mediastinum/Nodes: No enlarged mediastinal, hilar, or axillary lymph nodes. Thyroid gland and trachea demonstrate no significant findings. There is increased lipomatosis in the superior mediastinum. There is increased moderate thickening in the distal thoracic esophagus. Endoscopic follow-up recommended. Lungs/Pleura: There are trace pleural effusions. There is increased diffuse bronchial thickening. There is increased mosaic attenuation throughout the lung fields consistent with air trapping and small airways disease. There is increased linear atelectasis in the left upper lobe lingula. No confluent pneumonia is seen. There is a 1 cm ground-glass nodule again noted in the left upper lobe on 3:27, and medial to this an 8 mm ground-glass nodule on 3:26. These are both unchanged dating back to a CTA chest from 08/20/2017 therefore do not require further follow-up. No other lung nodules are seen, no other significant findings. Musculoskeletal: Multilevel thoracic spine bridging enthesopathy and degenerative disc disease. No aggressive or acute bone lesions. Right shoulder arthroplasty is again noted. The visualized chest wall without evidence of masses. Review of the MIP images confirms the above findings. CT ABDOMEN and PELVIS FINDINGS Hepatobiliary: 18 cm liver length. Mild hepatic steatosis. No mass enhancement. Again noted surgical absence of  gallbladder without biliary dilatation. Pancreas: Moderately atrophic and otherwise unremarkable. Spleen: No abnormality. Adrenals/Urinary  Tract: Chronic and stable 1.5 cm left adrenal genu nodule. No follow-up imaging recommended. No right adrenal mass is seen. The renal cortex is unremarkable. There are small parapelvic renal cysts, also unchanged. There is no urinary stone or obstruction. There is no bladder thickening. Stomach/Bowel: Her marked gastric bypass and hiatal hernia repair. No bowel obstruction or inflammation. An appendix is not seen in this patient. Vascular/Lymphatic: Aortic atherosclerosis. No enlarged abdominal or pelvic lymph nodes. Reproductive: Status post hysterectomy. No adnexal masses. Multiple pelvic phleboliths. Other: Small umbilical fat hernia. No new or incarcerated hernia. No free fluid or free air. There is chronic stranding in the low anterior pelvic wall most likely due to remote trauma or prior surgery. Musculoskeletal: Osteopenia with advanced degenerative change of the lumbar spine. Again noted is a sacral stimulator implanted in the left flank with the wire extending through the right S3 foramen. There is no aggressive osseous lesion. 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 No acute or other significant findings. Review of the MIP images confirms the above findings. IMPRESSION: 1. No evidence of arterial dilatation or embolus. 2. Aortic and coronary artery atherosclerosis. 3. Cardiomegaly without evidence of CHF. 4. Increased diffuse bronchial thickening and mosaic attenuation, the latter consistent with small airways disease and air trapping. No confluent pneumonia. 5. Stable left upper lobe ground-glass nodules x 5 + years. No follow-up imaging recommended. 6. Increased moderate thickening in the distal thoracic esophagus. Endoscopic follow-up recommended. 7. No acute findings in the abdomen or pelvis. 8. Chronic and stable 1.5 cm left adrenal nodule. No follow-up imaging recommended. 9.  Osteopenia and degenerative change. 10. Additional chronic findings detailed above. Electronically Signed   By: Almira Bar M.D.   On: 12/14/2022 06:29    Assessment & Plan:  .Type II diabetes mellitus with neurological manifestations (HCC) -     Microalbumin / creatinine urine ratio  Diabetic polyneuropathy associated with type 2 diabetes mellitus (HCC) Assessment & Plan: She has minimal sensation on microfilament test,  And requires continued  use of diabetic shoes to prevent pressure  Ulcers.    CKD stage 3 due to type 2 diabetes mellitus (HCC) Assessment & Plan: GFR is stable .  Diabetes is becoming uncontrolled due to dietary noncompliance.  Continue Farxiga,   basal insulin and adjustement of SSI  Avoid NSAIDs. She Is intolerant of lowest ARB dose due to nausea and hypotension  Lab Results  Component Value Date   HGBA1C 7.8 (A) 12/06/2022      Lab Results  Component Value Date   CREATININE 1.23 (H) 12/13/2022     Weight gain following gastric bypass surgery Assessment & Plan: Secondary to sedentary lifestyle and underestimation of caloric intake.  The cause of her sedentary lifestyle is multifactorial ; fecal incontinence is playing a major role.  Long discussion about the need to consider  colonoscopy   Morbid obesity [facility]) Assessment & Plan: Despite prior bariatric surgery and more recently, panniculectomy.   Historically the only time she lost weight was when her daughter cooked for her and adhered to the KETO diet .  Recommending colostomy to allow increased mobility     Other orders -     NovoLOG FlexPen; Inject 10 Units into the skin 3 (three) times daily with meals. If pre-meal glucose <100, ADD  2 units; 100-150 - ADD 4 units, 151-200 -add  6 units; 201-250 - add 8 units; 251-300 - add 10 units, 301-350 - iadd 12 units ; 350 > add 14 units);  Dispense: 45 mL;  Refill: 3     In addition to time spent revieweing the CBG monitor data,  I provided 30 minutes  of face-to-face time during this encounter reviewing patient's last visit with me, patient's  most recent visit with cardiology,  gastroenterology , previous surgical and non surgical procedures, previous  labs and imaging studies, counseling on currently addressed issues,  and post visit ordering to diagnostics and therapeutics .   Follow-up: Return in about 3 months (around 03/27/2023).   Sherlene Shams, MD

## 2022-12-26 NOTE — Telephone Encounter (Signed)
PA is needed

## 2022-12-27 ENCOUNTER — Other Ambulatory Visit: Payer: Self-pay

## 2022-12-27 ENCOUNTER — Telehealth: Payer: Self-pay | Admitting: Internal Medicine

## 2022-12-27 MED ORDER — CYANOCOBALAMIN 1000 MCG/ML IJ SOLN: 1000.0000 ug | INTRAMUSCULAR | 1 refills | Status: DC

## 2022-12-27 MED ORDER — "LUER LOCK SAFETY SYRINGES 25G X 1"" 3 ML MISC": 0 refills | Status: DC

## 2022-12-27 NOTE — Telephone Encounter (Signed)
Spoke with pt and she stated that the b12 injections were supposed to go to [facility]. I have resent to correct pharmacy.

## 2022-12-27 NOTE — Telephone Encounter (Signed)
Spoke with pt and she stated that the b12 injections were supposed to be sent to [facility] pharmacy. I have resent to correct pharmacy.

## 2022-12-27 NOTE — Telephone Encounter (Signed)
Pt called stating she would like to see If the provider sent in any new medication because she does not remember at the visit

## 2022-12-28 NOTE — Assessment & Plan Note (Addendum)
GFR is stable .  Diabetes is becoming uncontrolled due to dietary noncompliance.  Continue Farxiga,   basal insulin and adjustement of SSI  Avoid NSAIDs. She Is intolerant of lowest ARB dose due to nausea and hypotension  Lab Results  Component Value Date   HGBA1C 7.8 (A) 12/06/2022      Lab Results  Component Value Date   CREATININE 1.23 (H) 12/13/2022

## 2022-12-28 NOTE — Assessment & Plan Note (Signed)
Despite prior bariatric surgery and more recently, panniculectomy.   Historically the only time she lost weight was when her daughter cooked for her and adhered to the KETO diet .  Recommending colostomy to allow increased mobility

## 2022-12-28 NOTE — Assessment & Plan Note (Signed)
She has minimal sensation on microfilament test,  And requires continued  use of diabetic shoes to prevent pressure  Ulcers.  

## 2022-12-28 NOTE — Assessment & Plan Note (Signed)
Secondary to sedentary lifestyle and underestimation of caloric intake.  The cause of her sedentary lifestyle is multifactorial ; fecal incontinence is playing a major role.  Long discussion about the need to consider  colonoscopy

## 2023-01-07 ENCOUNTER — Telehealth: Payer: Self-pay

## 2023-01-07 ENCOUNTER — Ambulatory Visit (INDEPENDENT_AMBULATORY_CARE_PROVIDER_SITE_OTHER): Payer: Medicare Other | Admitting: Physician Assistant

## 2023-01-07 ENCOUNTER — Encounter: Payer: Self-pay | Admitting: Internal Medicine

## 2023-01-07 ENCOUNTER — Telehealth: Payer: Self-pay | Admitting: Internal Medicine

## 2023-01-07 ENCOUNTER — Encounter: Payer: Self-pay | Admitting: Physician Assistant

## 2023-01-07 ENCOUNTER — Other Ambulatory Visit: Payer: Self-pay

## 2023-01-07 VITALS — BP 121/61 | HR 81 | Temp 98.3°F | Ht 64.5 in | Wt 254.8 lb

## 2023-01-07 DIAGNOSIS — K222 Esophageal obstruction: Secondary | ICD-10-CM | POA: Diagnosis not present

## 2023-01-07 DIAGNOSIS — R1319 Other dysphagia: Secondary | ICD-10-CM | POA: Diagnosis not present

## 2023-01-07 DIAGNOSIS — K529 Noninfective gastroenteritis and colitis, unspecified: Secondary | ICD-10-CM

## 2023-01-07 DIAGNOSIS — K580 Irritable bowel syndrome with diarrhea: Secondary | ICD-10-CM | POA: Diagnosis not present

## 2023-01-07 DIAGNOSIS — K2100 Gastro-esophageal reflux disease with esophagitis, without bleeding: Secondary | ICD-10-CM

## 2023-01-07 MED ORDER — SUCRALFATE 1 G PO TABS
1.0000 g | ORAL_TABLET | Freq: Three times a day (TID) | ORAL | 3 refills | Status: DC
Start: 2023-01-07 — End: 2023-04-24

## 2023-01-07 MED ORDER — PANTOPRAZOLE SODIUM 40 MG PO TBEC
40.0000 mg | DELAYED_RELEASE_TABLET | Freq: Every day | ORAL | 3 refills | Status: DC
Start: 2023-01-07 — End: 2023-03-04

## 2023-01-07 NOTE — Progress Notes (Signed)
Celso Amy, PA-C [address]  [address]  [place], [place] [address]  Main: [phone]  Fax: [phone]   Primary Care Physician: Sherlene Shams, MD  Primary Gastroenterologist:  Dr. [name] / Celso Amy, PA-C   CC: ED follow-up noncardiac chest pain, epigastric pain, GERD  HPI: Denise Sharp is a 77 y.o. female presents for ED f/u of Chest Pain.  She has history of hypertension, IBS, diabetes, CKD.  Went to [facility] ED 12/14/2022 to evaluate chest pain.  Was located in her mid upper abdomen.  Took 2 nitroglycerin with no relief.  Cardiac evaluation was unrevealing.    Abdominal pelvic CT 12/14/2022 showed no acute abnormality.  There was esophageal thickening possibly related to GERD and esophagitis.  She was told to follow-up with her GI, Dr. Servando Snare.  She recently took Carafate and Reglan from ED.  Has been on long-term Protonix 40 mg once daily.  Zofran as needed.  Chest pain has improved on this treatment.  She has some difficulty swallowing large pills and dry foods such as baked chicken.  She denies any vomiting or food bolus episodes.  Last EGD 05/2021 showed 1 benign appearing mild stenosis at the GE junction dilated to 18 mm with TTS.  Evidence of Roux-en-Y gastric bypass.  Normal jejunum.  History of iron deficiency anemia, gastric bypass x 2, IBS, Chronic Diarrhea, esophageal stenosis.  Takes Bentyl and Imodium as needed.  Lab 12/13/2022 showed stable hemoglobin 11.0, hematocrit 35, MCV 100.  Has atrial fibrillation.  Underwent cardioversion on 10/30/2022.  History of CAD and had recent follow-up with cardiology.  Currently on Eliquis and is in NSR.  Current Outpatient Medications  Medication Sig Dispense Refill   albuterol (VENTOLIN HFA) 108 (90 Base) MCG/ACT inhaler Inhale 1-2 puffs into the lungs every 6 (six) hours as needed for wheezing or shortness of breath. 18 g 2   apixaban (ELIQUIS) 5 MG TABS tablet Take 1 tablet (5 mg total) by mouth 2 (two) times  daily. Lot # [id number] Exp. 5/25 28 tablet 0   atorvastatin (LIPITOR) 80 MG tablet Take 1 tablet (80 mg total) by mouth daily. 90 tablet 1   benzonatate (TESSALON) 200 MG capsule Take 1 capsule (200 mg total) by mouth 2 (two) times daily as needed for cough. 20 capsule 0   butalbital-acetaminophen-caffeine (FIORICET) 50-325-40 MG tablet TAKE 1 TABLET BY MOUTH EVERY 6 HOURS AS NEEDED FOR HEADACHE. 60 tablet 2   Cholecalciferol (VITAMIN D3) 250 MCG (10000 UT) capsule Take 10,000 Units by mouth daily.     clotrimazole-betamethasone (LOTRISONE) cream Apply 1 application topically 2 (two) times daily as needed (irritation).     conjugated estrogens (PREMARIN) vaginal cream Apply one pea-sized amount around the opening of the urethra daily for 2 weeks, then 3 times weekly moving forward. 30 g 4   Continuous Blood Gluc Receiver (FREESTYLE LIBRE 2 READER) DEVI Use to check sugar at least TID 1 each 1   cyanocobalamin (VITAMIN B12) 1000 MCG/ML injection Inject 1 mL (1,000 mcg total) into the muscle every 30 (thirty) days. Inject 1 ml (1000 mcg ) IM weekly x 4,  Then monthly thereafter 3 mL 1   dapagliflozin propanediol (FARXIGA) 10 MG TABS tablet Take 1 tablet (10 mg total) by mouth daily. 90 tablet 3   dicyclomine (BENTYL) 20 MG tablet Take 1 tablet (20 mg total) by mouth 3 (three) times daily before meals. 180 tablet 2   DULoxetine (CYMBALTA) 30 MG capsule Take  1 capsule (30 mg total) by mouth daily. 90 capsule 4   ezetimibe (ZETIA) 10 MG tablet Take 1 tablet (10 mg total) by mouth daily. 90 tablet 3   fexofenadine (ALLEGRA) 180 MG tablet Take 180 mg by mouth as needed for allergies or rhinitis.     fluticasone (FLONASE) 50 MCG/ACT nasal spray Place 2 sprays into both nostrils daily. (Patient taking differently: Place 2 sprays into both nostrils 3 (three) times daily as needed for allergies.) 16 g 6   Glucagon (GVOKE HYPOPEN 2-PACK) 0.5 MG/0.1ML SOAJ Inject 1 auto injector subcutaneously as needed for severe  hypoglycemia. May repeat x 1 after 15 minutes if needed. 0.1 mL 2   glucose blood (ONETOUCH VERIO) test strip Use to check blood sugar twice daily. 100 each 1   insulin aspart (NOVOLOG FLEXPEN) 100 UNIT/ML FlexPen Inject 10 Units into the skin 3 (three) times daily with meals. If pre-meal glucose <100, ADD  2 units; 100-150 - ADD 4 units, 151-200 -add  6 units; 201-250 - add 8 units; 251-300 - add 10 units, 301-350 - iadd 12 units ; 350 > add 14 units); 45 mL 3   insulin degludec (TRESIBA FLEXTOUCH) 100 UNIT/ML FlexTouch Pen Inject 12 Units into the skin daily. 15 mL 3   Insulin Pen Needle (DROPLET PEN NEEDLES) 31G X 5 MM MISC USE ONE NEEDLE SUBCUTANEOUSLY AS DIRECTED (REMOVE AND DISCARD NEEDLE IN SHARPS CONTAINER IMMEDIATELY AFTER USE) 100 each 1   loperamide (IMODIUM) 2 MG capsule Take 4-6 mg by mouth 3 (three) times daily before meals.     magnesium gluconate (MAGONATE) 500 MG tablet Take 500 mg by mouth daily.     metoprolol succinate (TOPROL-XL) 25 MG 24 hr tablet Take 0.5 tablets (12.5 mg total) by mouth daily. Take with or immediately following a meal. 45 tablet 3   nitroGLYCERIN (NITROSTAT) 0.4 MG SL tablet Place 1 tablet (0.4 mg total) under the tongue every 5 (five) minutes as needed for chest pain. 25 tablet 0   nystatin (MYCOSTATIN/NYSTOP) powder Apply 1 g topically 4 (four) times daily as needed (rash).     ondansetron (ZOFRAN-ODT) 4 MG disintegrating tablet Take 1 tablet (4 mg total) by mouth every 8 (eight) hours as needed for nausea or vomiting. 30 tablet 0   OneTouch Delica Lancets 33G MISC Used to check blood sugar two times a day. 100 each 4   OVER THE COUNTER MEDICATION Apply 1 application  topically daily as needed (pain). [place] Gel topical pain reliever     Polyethyl Glycol-Propyl Glycol (SYSTANE OP) Place 1 drop into both eyes 4 (four) times daily as needed (dry eyes).     pregabalin (LYRICA) 50 MG capsule Take 1 capsule (50 mg total) by mouth 2 (two) times daily. 60 capsule 2    SYRINGE-NEEDLE, DISP, 3 ML (LUER LOCK SAFETY SYRINGES) 25G X 1" 3 ML MISC Use to give b12 injections once monthly 12 each 0   pantoprazole (PROTONIX) 40 MG tablet Take 1 tablet (40 mg total) by mouth daily. 90 tablet 3   sucralfate (CARAFATE) 1 g tablet Take 1 tablet (1 g total) by mouth 3 (three) times daily before meals. 270 tablet 3   No current facility-administered medications for this visit.    Allergies as of 01/07/2023 - Review Complete 01/07/2023  Allergen Reaction Noted   Demeclocycline Hives 09/18/2013   Erythromycin Nausea And Vomiting and Other (See Comments) 08/17/2011   Flagyl [metronidazole] Nausea And Vomiting and Other (See Comments) 08/17/2011  Glucophage [metformin hcl] Nausea And Vomiting and Other (See Comments) 08/17/2011   Tetracyclines & related Hives and Rash 03/12/2011   Furosemide Swelling and Other (See Comments) 03/12/2011   Tetracycline Other (See Comments) 09/27/2022   Diovan [valsartan] Nausea Only 08/17/2011   Sulfa antibiotics Other (See Comments) and Rash 09/18/2013   Xanax [alprazolam] Other (See Comments) 08/17/2011    Past Medical History:  Diagnosis Date   (HFpEF) heart failure with preserved ejection fraction (HCC)    a. 12/2019 Echo: EF 65-70%, Gr2 DD. No significant valvular dzs.   Abnormality of gait 03/25/2013   Adrenal mass, left (HCC)    Anginal pain (HCC)    Anxiety    Aortic atherosclerosis (HCC)    Arthritis    Asthma    CAD (coronary artery disease) with h/o Atypical Chest Pain    a. 04/1986 Cath ([facility]): nl cors, EF 65%; b. 07/2012 Cath [facility]): Diff minor irregs; c. 07/2016 MV [facility]): "Equivocal"; d. 08/2016 Cardiac CT Ca2+ score [facility]): Ca2+ 1548; e. 05/2019 MV: No ischemia. EF 75%; f. 12/2019 PCI: LM nl, LAD 65ost (3.5x12 Resolute DES), 26m (2.75x12 Resolute DES),LCX/RCA nl; g. 08/2020 Cath: patent LAD stents, RCA 40ost, elev RH pressures, EF >65%.   Cervical spinal stenosis 1994   due to trauma to back ([employer] accident), has  intermittent paralysis and parasthesias   Cervicogenic headache 03/23/2014   CKD (chronic kidney disease), stage III (HCC)    Depression    Diverticulosis    Dizziness    a.) chronic   DJD (degenerative joint disease)    a. Chronic R shoulder pain   Dyspnea    Esophageal stenosis 09/2009   a.) transient outlet obstruction by food, cleared by EGD   Family history of adverse reaction to anesthesia    a.) daughter with (+) PONV   Gastric bypass status for obesity    GERD (gastroesophageal reflux disease)    Headache(784.0)    HLD (hyperlipidemia)    Hypertension    IBS (irritable bowel syndrome)    IDA (iron deficiency anemia)    a.) post 2 unit txfsn 2009, normal endo/colonoscopy by [facility]   Left bundle branch block (LBBB)    a.) Intermittently present - likely rate related.   NSVT (nonsustained ventricular tachycardia) (HCC) 11/15/2020   a.) Holter study 11/15/2020; 11 episodes of NSVT lasting up to 8 beats with a maximum rate of 210 bpm; 14 atrial runs lasting up to 18 beats with a rate of up to 250 bpm.  Some atrial runs felt to be NSVT.   NSVT (nonsustained ventricular tachycardia) (HCC)    a. 10/2020 [facility]: 11 runs of NSVT up to 8 beats.   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 10/2020 [facility]: 14 atrial runs up to 18 beats, max HR 250.   Restless leg syndrome    Rotator cuff arthropathy, right 08/13/2017   Syncope and collapse 03/12/2014   Type II diabetes mellitus (HCC)     Past Surgical History:  Procedure Laterality Date   APPENDECTOMY     BACK SURGERY     CARDIOVERSION N/A 10/30/2022   Procedure: CARDIOVERSION;  Surgeon: [name], MD;  Location: [facility];  Service: Cardiovascular;  Laterality: N/A;   CHOLECYSTECTOMY N/A 1976   COLONOSCOPY     CORONARY ANGIOPLASTY     CORONARY STENT INTERVENTION N/A  01/04/2020    Procedure: CORONARY STENT INTERVENTION;  Surgeon: Iran Ouch, MD;  Location: [facility];  Service: Cardiovascular;  Laterality: N/A;  LAD    DIAPHRAGMATIC HERNIA REPAIR  2015   ESOPHAGEAL DILATION     multiple   ESOPHAGOGASTRODUODENOSCOPY (EGD) WITH PROPOFOL N/A 05/11/2021   Procedure: ESOPHAGOGASTRODUODENOSCOPY (EGD) WITH PROPOFOL;  Surgeon: [name], MD;  Location: [facility];  Service: Endoscopy;  Laterality: N/A;   ESOPHAGOGASTRODUODENOSCOPY (EGD) WITH PROPOFOL N/A 06/13/2021   Procedure: ESOPHAGOGASTRODUODENOSCOPY (EGD) WITH PROPOFOL;  Surgeon: [name], MD;  Location: [facility];  Service: Endoscopy;  Laterality: N/A;   EYE SURGERY     GASTRIC BYPASS  2000, 2005   Dr. Arlana Pouch IMPLANT PLACEMENT  10/2011   Jeannette How IMPLANT PLACEMENT N/A 09/18/2021   Procedure: Leane Platt IMPLANT FIRST STAGE;  Surgeon: Alfredo Martinez, MD;  Location: [facility];  Service: Urology;  Laterality: N/A;   INTERSTIM IMPLANT PLACEMENT N/A 09/18/2021   Procedure: Leane Platt IMPLANT SECOND STAGE WITH IMPEDENCE CHECK;  Surgeon: Alfredo Martinez, MD;  Location: [facility];  Service: Urology;  Laterality: N/A;   INTERSTIM IMPLANT REMOVAL N/A 09/18/2021   Procedure: REMOVAL OF INTERSTIM IMPLANT;  Surgeon: Alfredo Martinez, MD;  Location: [facility];  Service: Urology;  Laterality: N/A;   LEFT HEART CATH AND CORONARY ANGIOGRAPHY N/A 04/1986   LEFT HEART CATH AND CORONARY ANGIOGRAPHY Left 07/24/2012   LEFT HEART CATH AND CORS/GRAFTS ANGIOGRAPHY N/A 12/31/2019   Procedure: LEFT HEART CATH AND CORS/GRAFTS ANGIOGRAPHY;  Surgeon: Antonieta Iba, MD;  Location: [facility];  Service: Cardiovascular;  Laterality: N/A;   PANNICULECTOMY N/A 06/16/2019   Procedure: PANNICULECTOMY;  Surgeon: Allena Napoleon, MD;  Location: [facility];  Service: Plastics;  Laterality: N/A;  3 hours, please   REVERSE SHOULDER ARTHROPLASTY Right 08/13/2017   Procedure: REVERSE RIGHT SHOULDER  ARTHROPLASTY;  Surgeon: Teryl Lucy, MD;  Location: [facility];  Service: Orthopedics;  Laterality: Right;   RIGHT/LEFT HEART CATH AND CORONARY ANGIOGRAPHY N/A 09/06/2020   Procedure: RIGHT/LEFT HEART CATH AND CORONARY ANGIOGRAPHY;  Surgeon: [name], MD;  Location: [facility];  Service: Cardiovascular;  Laterality: N/A;   RIGHT/LEFT HEART CATH AND CORONARY ANGIOGRAPHY N/A 06/04/2022   Procedure: RIGHT/LEFT HEART CATH AND CORONARY ANGIOGRAPHY;  Surgeon: [name], MD;  Location: [facility];  Service: Cardiovascular;  Laterality: N/A;   ROTATOR CUFF REPAIR Right    SPINE SURGERY  1995   [place]   TOOTH EXTRACTION  11/13/2021   TOTAL ABDOMINAL HYSTERECTOMY W/ BILATERAL SALPINGOOPHORECTOMY  1974   TOTAL KNEE ARTHROPLASTY Bilateral 2007   Procedure: TOTAL KNEE ARTHROPLASTY; Surgeons: Gerrit Heck, MD and [name], MD   UMBILICAL HERNIA REPAIR  08/11/2015    Review of Systems:    All systems reviewed and negative except where noted in HPI.   Physical Examination:   BP 121/61   Pulse 81   Temp 98.3 F (36.8 C)   Ht 5' 4.5" (1.638 m)   Wt 254 lb 12.8 oz (115.6 kg)   BMI 43.06 kg/m   General: Obese, deconditioned, Well-nourished, well-developed in no acute distress.  Walks with a walker.  Moderate difficulty getting on and off exam table. Eyes: No icterus. Conjunctivae pink. Mouth: Oropharyngeal mucosa moist and pink , no lesions erythema or exudate. Lungs: Clear to auscultation bilaterally. Non-labored. Heart: Regular rate and rhythm, no murmurs rubs or gallops.  Abdomen: Bowel sounds are normal; Abdomen is Soft and very obese; no hepatosplenomegaly, masses  or hernias;  No Abdominal Tenderness; No guarding or rebound tenderness. Extremities: No lower extremity edema. No clubbing or deformities. Neuro: Alert and oriented x 3.  Grossly intact. Skin: Warm and dry, no jaundice.   Psych: Alert and cooperative, normal mood and affect.   Imaging Studies: CT Angio  Chest PE W and/or Wo Contrast  Result Date: 12/14/2022 CLINICAL DATA:  Chest and abdominal pain. EXAM: CT ANGIOGRAPHY CHEST CT ABDOMEN AND PELVIS WITH CONTRAST TECHNIQUE: Multidetector CT imaging of the chest was performed using the standard protocol during bolus administration of intravenous contrast. Multiplanar CT image reconstructions and MIPs were obtained to evaluate the vascular anatomy. Multidetector CT imaging of the abdomen and pelvis was performed using the standard protocol during bolus administration of intravenous contrast. RADIATION DOSE REDUCTION: This exam was performed according to the departmental dose-optimization program which includes automated exposure control, adjustment of the mA and/or kV according to patient size and/or use of iterative reconstruction technique. CONTRAST:  75mL OMNIPAQUE IOHEXOL 350 MG/ML SOLN COMPARISON:  PA and lateral chest yesterday, PA and lateral chest 10/23/2021, chest CT with contrast 02/08/2021, abdomen and pelvis CT with contrast 07/25/2022 and 06/27/2021. FINDINGS: CTA CHEST FINDINGS Cardiovascular: There is mild panchamber cardiomegaly increased from 2022. There are calcifications in the mitral ring, and three-vessel coronary calcifications greatest in the LAD. There is no pericardial effusion. There is mild aortic tortuosity, scattered plaques in the great vessels. There is no aortic aneurysm, dissection or stenosis. The descending segment is mildly tortuous. The pulmonary arteries are normal in caliber and do not show evidence of thromboemboli. There is no venous distention. Mediastinum/Nodes: No enlarged mediastinal, hilar, or axillary lymph nodes. Thyroid gland and trachea demonstrate no significant findings. There is increased lipomatosis in the superior mediastinum. There is increased moderate thickening in the distal thoracic esophagus. Endoscopic follow-up recommended. Lungs/Pleura: There are trace pleural effusions. There is increased diffuse  bronchial thickening. There is increased mosaic attenuation throughout the lung fields consistent with air trapping and small airways disease. There is increased linear atelectasis in the left upper lobe lingula. No confluent pneumonia is seen. There is a 1 cm ground-glass nodule again noted in the left upper lobe on 3:27, and medial to this an 8 mm ground-glass nodule on 3:26. These are both unchanged dating back to a CTA chest from 08/20/2017 therefore do not require further follow-up. No other lung nodules are seen, no other significant findings. Musculoskeletal: Multilevel thoracic spine bridging enthesopathy and degenerative disc disease. No aggressive or acute bone lesions. Right shoulder arthroplasty is again noted. The visualized chest wall without evidence of masses. Review of the MIP images confirms the above findings. CT ABDOMEN and PELVIS FINDINGS Hepatobiliary: 18 cm liver length. Mild hepatic steatosis. No mass enhancement. Again noted surgical absence of gallbladder without biliary dilatation. Pancreas: Moderately atrophic and otherwise unremarkable. Spleen: No abnormality. Adrenals/Urinary Tract: Chronic and stable 1.5 cm left adrenal genu nodule. No follow-up imaging recommended. No right adrenal mass is seen. The renal cortex is unremarkable. There are small parapelvic renal cysts, also unchanged. There is no urinary stone or obstruction. There is no bladder thickening. Stomach/Bowel: Her marked gastric bypass and hiatal hernia repair. No bowel obstruction or inflammation. An appendix is not seen in this patient. Vascular/Lymphatic: Aortic atherosclerosis. No enlarged abdominal or pelvic lymph nodes. Reproductive: Status post hysterectomy. No adnexal masses. Multiple pelvic phleboliths. Other: Small umbilical fat hernia. No new or incarcerated hernia. No free fluid or free air. There is chronic stranding in the low anterior  pelvic wall most likely due to remote trauma or prior surgery.  Musculoskeletal: Osteopenia with advanced degenerative change of the lumbar spine. Again noted is a sacral stimulator implanted in the left flank with the wire extending through the right S3 foramen. There is no aggressive osseous lesion. 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 No acute or other significant findings. Review of the MIP images confirms the above findings. IMPRESSION: 1. No evidence of arterial dilatation or embolus. 2. Aortic and coronary artery atherosclerosis. 3. Cardiomegaly without evidence of CHF. 4. Increased diffuse bronchial thickening and mosaic attenuation, the latter consistent with small airways disease and air trapping. No confluent pneumonia. 5. Stable left upper lobe ground-glass nodules x 5 + years. No follow-up imaging recommended. 6. Increased moderate thickening in the distal thoracic esophagus. Endoscopic follow-up recommended. 7. No acute findings in the abdomen or pelvis. 8. Chronic and stable 1.5 cm left adrenal nodule. No follow-up imaging recommended. 9. Osteopenia and degenerative change. 10. Additional chronic findings detailed above. Electronically Signed   By: Almira Bar M.D.   On: 12/14/2022 06:29   CT ABDOMEN PELVIS W CONTRAST  Result Date: 12/14/2022 CLINICAL DATA:  Chest and abdominal pain. EXAM: CT ANGIOGRAPHY CHEST CT ABDOMEN AND PELVIS WITH CONTRAST TECHNIQUE: Multidetector CT imaging of the chest was performed using the standard protocol during bolus administration of intravenous contrast. Multiplanar CT image reconstructions and MIPs were obtained to evaluate the vascular anatomy. Multidetector CT imaging of the abdomen and pelvis was performed using the standard protocol during bolus administration of intravenous contrast. RADIATION DOSE REDUCTION: This exam was performed according to the departmental dose-optimization program which includes automated exposure control, adjustment of the mA and/or kV according to patient size and/or use of iterative reconstruction  technique. CONTRAST:  75mL OMNIPAQUE IOHEXOL 350 MG/ML SOLN COMPARISON:  PA and lateral chest yesterday, PA and lateral chest 10/23/2021, chest CT with contrast 02/08/2021, abdomen and pelvis CT with contrast 07/25/2022 and 06/27/2021. FINDINGS: CTA CHEST FINDINGS Cardiovascular: There is mild panchamber cardiomegaly increased from 2022. There are calcifications in the mitral ring, and three-vessel coronary calcifications greatest in the LAD. There is no pericardial effusion. There is mild aortic tortuosity, scattered plaques in the great vessels. There is no aortic aneurysm, dissection or stenosis. The descending segment is mildly tortuous. The pulmonary arteries are normal in caliber and do not show evidence of thromboemboli. There is no venous distention. Mediastinum/Nodes: No enlarged mediastinal, hilar, or axillary lymph nodes. Thyroid gland and trachea demonstrate no significant findings. There is increased lipomatosis in the superior mediastinum. There is increased moderate thickening in the distal thoracic esophagus. Endoscopic follow-up recommended. Lungs/Pleura: There are trace pleural effusions. There is increased diffuse bronchial thickening. There is increased mosaic attenuation throughout the lung fields consistent with air trapping and small airways disease. There is increased linear atelectasis in the left upper lobe lingula. No confluent pneumonia is seen. There is a 1 cm ground-glass nodule again noted in the left upper lobe on 3:27, and medial to this an 8 mm ground-glass nodule on 3:26. These are both unchanged dating back to a CTA chest from 08/20/2017 therefore do not require further follow-up. No other lung nodules are seen, no other significant findings. Musculoskeletal: Multilevel thoracic spine bridging enthesopathy and degenerative disc disease. No aggressive or acute bone lesions. Right shoulder arthroplasty is again noted. The visualized chest wall without evidence of masses. Review of  the MIP images confirms the above findings. CT ABDOMEN and PELVIS FINDINGS Hepatobiliary: 18 cm liver length. Mild hepatic steatosis. No  mass enhancement. Again noted surgical absence of gallbladder without biliary dilatation. Pancreas: Moderately atrophic and otherwise unremarkable. Spleen: No abnormality. Adrenals/Urinary Tract: Chronic and stable 1.5 cm left adrenal genu nodule. No follow-up imaging recommended. No right adrenal mass is seen. The renal cortex is unremarkable. There are small parapelvic renal cysts, also unchanged. There is no urinary stone or obstruction. There is no bladder thickening. Stomach/Bowel: Her marked gastric bypass and hiatal hernia repair. No bowel obstruction or inflammation. An appendix is not seen in this patient. Vascular/Lymphatic: Aortic atherosclerosis. No enlarged abdominal or pelvic lymph nodes. Reproductive: Status post hysterectomy. No adnexal masses. Multiple pelvic phleboliths. Other: Small umbilical fat hernia. No new or incarcerated hernia. No free fluid or free air. There is chronic stranding in the low anterior pelvic wall most likely due to remote trauma or prior surgery. Musculoskeletal: Osteopenia with advanced degenerative change of the lumbar spine. Again noted is a sacral stimulator implanted in the left flank with the wire extending through the right S3 foramen. There is no aggressive osseous lesion. 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 No acute or other significant findings. Review of the MIP images confirms the above findings. IMPRESSION: 1. No evidence of arterial dilatation or embolus. 2. Aortic and coronary artery atherosclerosis. 3. Cardiomegaly without evidence of CHF. 4. Increased diffuse bronchial thickening and mosaic attenuation, the latter consistent with small airways disease and air trapping. No confluent pneumonia. 5. Stable left upper lobe ground-glass nodules x 5 + years. No follow-up imaging recommended. 6. Increased moderate thickening in the distal  thoracic esophagus. Endoscopic follow-up recommended. 7. No acute findings in the abdomen or pelvis. 8. Chronic and stable 1.5 cm left adrenal nodule. No follow-up imaging recommended. 9. Osteopenia and degenerative change. 10. Additional chronic findings detailed above. Electronically Signed   By: Almira Bar M.D.   On: 12/14/2022 06:29   DG Chest 2 View  Result Date: 12/13/2022 CLINICAL DATA:  Chest pain EXAM: CHEST - 2 VIEW COMPARISON:  10/23/2021 FINDINGS: Mild cardiomegaly. No acute airspace disease or effusion. No pneumothorax. Right shoulder replacement IMPRESSION: No active cardiopulmonary disease. Mild cardiomegaly. Electronically Signed   By: Jasmine Pang M.D.   On: 12/13/2022 22:53    Assessment and Plan:   WETONA VIRAMONTES is a 76 y.o. y/o female [facility] for ED follow-up of GERD, dysphagia, history of of esophageal stricture.  Last EGD by Dr. Daleen Squibb 05/2021 showed mild distal esophageal stricture dilated to 18 mm.  I am scheduling repeat EGD with possible dilation.  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  Extensive GI evaluation in the past.  GERD with esophagitis  Continue pantoprazole 40 Mg 1 tablet once daily 30 minutes before breakfast.  Continue sucralfate 1 g 3 times daily before meals.  Stop Reglan (metoclopramide).   Esophageal stricture  Scheduling EGD with esophageal dilation I discussed risks of EGD with patient to include risk of bleeding, perforation, and risk of sedation.   Patient expressed understanding and agrees to proceed with EGD.   Dysphagia  Chronic diarrhea Continue Imodium as needed.  Patient expressed interest in pursuing colectomy and colostomy today.  I will have her follow back up with Dr. Servando Snare to discuss referral to tertiary care center. She has long history of chronic diarrhea followed by Dr. Servando Snare for many years.  She has declined colectomy and colostomy in the  past.  Irritable bowel syndrome with diarrhea  Continue dicyclomine and Imodium as needed.  Also recommended she  add OTC fiber supplement such as Metamucil daily to help bulk stool.   Celso Amy, PA-C  Follow up with Dr. Servando Snare after EGD procedure.

## 2023-01-07 NOTE — Telephone Encounter (Signed)
Preoperative team, patient has upcoming cardiology appointment on 01/28/2023.  Please add preoperative cardiac evaluation to appointment notes.  I will defer preoperative cardiac evaluation to appointment.  Pharmacy has provided recommendations for holding anticoagulation.  Thank you.  Thomasene Ripple. Tobenna Needs NP-C     01/07/2023, 3:57 PM [facility] [address] Office [phone] Fax [phone]

## 2023-01-07 NOTE — Telephone Encounter (Signed)
Patient with diagnosis of afib on Eliquis for anticoagulation.    Procedure: colonoscopy is listed as TBD, however pt is scheduled for an EGD in Epic on 02/28/23  CHA2DS2-VASc Score = 7  This indicates a 11.2% annual risk of stroke. The patient's score is based upon: CHF History: 1 HTN History: 1 Diabetes History: 1 Stroke History: 0 Vascular Disease History: 1 Age Score: 2 Gender Score: 1   S/p DCCV 10/30/22.  CrCl 39mL/min using adjusted body weight Platelet count 224K  Per office protocol, patient can hold Eliquis for 1-2 days prior to procedure.    **This guidance is not considered finalized until pre-operative APP has relayed final recommendations.**

## 2023-01-07 NOTE — Telephone Encounter (Signed)
   Pre-operative Risk Assessment    Patient Name: Denise Sharp  DOB: [birth year] MRN: [id number]      Request for Surgical Clearance    Procedure:   colonscopy  Date of Surgery:  Clearance TBD                                 Surgeon:  end Surgeon's Group or Practice Name:   [facility] Phone number:  [phone] Fax number:  [phone]   Type of Clearance Requested:   - Medical    Type of Anesthesia:  General    Additional requests/questions:    SignedShawna Orleans   01/07/2023, 2:42 PM

## 2023-01-07 NOTE — Telephone Encounter (Signed)
Patient scheduled for EGD 02/28/23 I forgot to add Eliquis to her EGD instructions- Clearance has been faxed to DR.Christopher End. Patient has an appointment w/ Dr.End in July.  Patient was told I would get in touch once I hear back from Dr.End.

## 2023-01-07 NOTE — Telephone Encounter (Addendum)
I will update all parties involved the pt has appt 01/28/23 with Cadence Fransico Michael, PAC.   CORRECTION ON CLEARANCE: THE SURGEON IS DR. DARREN WOHL, DR. END IS THE PT'S CARDIOLOGIST.

## 2023-01-14 ENCOUNTER — Ambulatory Visit (INDEPENDENT_AMBULATORY_CARE_PROVIDER_SITE_OTHER): Payer: Medicare Other | Admitting: Podiatry

## 2023-01-14 ENCOUNTER — Encounter: Payer: Self-pay | Admitting: Podiatry

## 2023-01-14 VITALS — BP 138/65

## 2023-01-14 DIAGNOSIS — E1149 Type 2 diabetes mellitus with other diabetic neurological complication: Secondary | ICD-10-CM

## 2023-01-14 DIAGNOSIS — M79676 Pain in unspecified toe(s): Secondary | ICD-10-CM

## 2023-01-14 DIAGNOSIS — B351 Tinea unguium: Secondary | ICD-10-CM

## 2023-01-14 NOTE — Progress Notes (Signed)
  Subjective:  Patient ID: Denise Sharp, female    DOB: 13-May-1946,  MRN: [id number]  Denise Sharp presents to clinic today for at risk foot care with history of diabetic neuropathy and painful thick toenails that are difficult to trim. Pain interferes with ambulation. Aggravating factors include wearing enclosed shoe gear. Pain is relieved with periodic professional debridement.  Chief Complaint  Patient presents with   Nail Problem    DFC,Referring Provider Sherlene Shams, MD,lov:06/24,BS: unknown  ,A1C:7.8      New problem(s): None.   PCP is Sherlene Shams, MD.  Allergies  Allergen Reactions   Demeclocycline Hives   Erythromycin Nausea And Vomiting and Other (See Comments)    Severe irritable bowel   Flagyl [Metronidazole] Nausea And Vomiting and Other (See Comments)    Severe irritable bowel   Glucophage [Metformin Hcl] Nausea And Vomiting and Other (See Comments)    "Sick" "I won't take anything that has metformin in it"   Tetracyclines & Related Hives and Rash   Furosemide Swelling and Other (See Comments)    Other reaction(s): Localized superficial swelling of skin  Has opposite effect   Tetracycline Other (See Comments)   Diovan [Valsartan] Nausea Only        Sulfa Antibiotics Other (See Comments) and Rash    As child   Xanax [Alprazolam] Other (See Comments)    Hyperactivity     Review of Systems: Negative except as noted in the HPI.  Objective: No changes noted in today's physical examination. Vitals:   01/14/23 1500  BP: 138/65   Denise Sharp is a pleasant 77 y.o. female in NAD. AAO x 3.  Vascular Examination: Capillary refill time immediate b/l. Vascular status intact b/l with palpable pedal pulses. Pedal hair absent b/l. No pain with calf compression b/l. Skin temperature gradient WNL b/l. No cyanosis or clubbing b/l. No ischemia or gangrene noted b/l.   Neurological Examination: Protective sensation diminished with 10g monofilament  b/l.  Dermatological Examination: Pedal skin with normal turgor, texture and tone b/l.  No open wounds. No interdigital macerations.   Toenails 1-5 b/l thick, discolored, elongated with subungual debris and pain on dorsal palpation.   No hyperkeratotic nor porokeratotic lesions present on today's visit.  Musculoskeletal Examination: Normal muscle strength 5/5 to all lower extremity muscle groups bilaterally. Charcot deformity noted both feet.[name] No pain, crepitus or joint limitation noted with ROM b/l LE.  Patient ambulates independently without assistive aids.  Radiographs: None  Last A1c:      Latest Ref Rng & Units 12/06/2022    1:29 PM 07/13/2022    3:29 PM 02/09/2022   11:19 AM  Hemoglobin A1C  Hemoglobin-A1c 4.0 - 5.6 % 7.8  7.1  7.7    Assessment/Plan: 1. Pain due to onychomycosis of toenail   2. Type II diabetes mellitus with neurological manifestations [facility])    -Patient was evaluated and treated. All patient's and/or POA's questions/concerns answered on today's visit. -Continue foot and shoe inspections daily. Monitor blood glucose per PCP/Endocrinologist's recommendations. -Patient to continue soft, supportive shoe gear daily. -Toenails 1-5 b/l were debrided in length and girth with sterile nail nippers and dremel without iatrogenic bleeding.  -Patient/POA to call should there be question/concern in the interim.   Return in about 3 months (around 04/16/2023).  Freddie Breech, DPM

## 2023-01-15 ENCOUNTER — Telehealth: Payer: Self-pay | Admitting: Physician Assistant

## 2023-01-15 NOTE — Telephone Encounter (Signed)
Patients called in to see when was her appointment.

## 2023-01-16 ENCOUNTER — Other Ambulatory Visit (HOSPITAL_COMMUNITY): Payer: Self-pay

## 2023-01-16 MED ORDER — HUMALOG JUNIOR KWIKPEN 100 UNIT/ML ~~LOC~~ SOPN: PEN_INJECTOR | SUBCUTANEOUS | 2 refills | Status: DC

## 2023-01-16 NOTE — Telephone Encounter (Signed)
Please change to generic Humalog, as this is covered for $9.14 per test claim. Thank you

## 2023-01-16 NOTE — Telephone Encounter (Signed)
Pharmacy is requesting alternative to novolog due to insurance. Humalog sent . Pleas use same sliding scale as directed with novolog

## 2023-01-21 ENCOUNTER — Ambulatory Visit: Payer: Medicare Other | Admitting: Physician Assistant

## 2023-01-24 ENCOUNTER — Telehealth: Payer: Self-pay | Admitting: Internal Medicine

## 2023-01-24 ENCOUNTER — Other Ambulatory Visit: Payer: Self-pay | Admitting: Internal Medicine

## 2023-01-24 DIAGNOSIS — E113393 Type 2 diabetes mellitus with moderate nonproliferative diabetic retinopathy without macular edema, bilateral: Secondary | ICD-10-CM | POA: Diagnosis not present

## 2023-01-24 DIAGNOSIS — I4819 Other persistent atrial fibrillation: Secondary | ICD-10-CM

## 2023-01-24 MED ORDER — APIXABAN 5 MG PO TABS
5.0000 mg | ORAL_TABLET | Freq: Two times a day (BID) | ORAL | 1 refills | Status: DC
Start: 2023-01-24 — End: 2023-01-29

## 2023-01-24 NOTE — Telephone Encounter (Signed)
Spoke with patient and advised her to call PCP for refill of Lyrica. Patient expressed gratitude for the call. Please review for refill of Eliquis. Thank you!

## 2023-01-24 NOTE — Telephone Encounter (Signed)
Prescription refill request for Eliquis received. Indication: Afib  Last office visit: 11/27/22 Fransico Michael)  Scr:1.23 (12/13/22)   Age: 77 Weight: 115.6kg  Appropriate dose. Refill sent.

## 2023-01-24 NOTE — Telephone Encounter (Signed)
*  STAT* If patient is at the pharmacy, call can be transferred to refill team.   1. Which medications need to be refilled? (please list name of each medication and dose if known)  pregabalin (LYRICA) 50 MG capsule  apixaban (ELIQUIS) 5 MG TABS tablet    2. Which pharmacy/location (including street and city if local pharmacy) is medication to be sent to? [facility] - [place], [place] - [address]   3. Do they need a 30 day or 90 day supply? 90 day

## 2023-01-24 NOTE — Telephone Encounter (Signed)
Prescription Request  01/24/2023  LOV: 12/25/2022  What is the name of the medication or equipment? pregabalin (LYRICA) 50 MG capsule, [facility] needs a new script for B12.   Have you contacted your pharmacy to request a refill? No   Which pharmacy would you like this sent to?   [facility] - [place], [place] - [address] [facility] [address] [place] [address] Phone: [phone] Fax: [phone]    Patient notified that their request is being sent to the clinical staff for review and that they should receive a response within 2 business days.   Please advise at [facility] [phone]

## 2023-01-25 ENCOUNTER — Other Ambulatory Visit: Payer: Self-pay | Admitting: Internal Medicine

## 2023-01-25 MED ORDER — PREGABALIN 50 MG PO CAPS: 50.0000 mg | ORAL_CAPSULE | Freq: Two times a day (BID) | ORAL | 1 refills | Status: DC

## 2023-01-25 MED ORDER — PREGABALIN 50 MG PO CAPS: 50.0000 mg | ORAL_CAPSULE | Freq: Two times a day (BID) | ORAL | 0 refills | Status: DC

## 2023-01-25 MED ORDER — CYANOCOBALAMIN 1000 MCG/ML IJ SOLN: 1000.0000 ug | INTRAMUSCULAR | 1 refills | Status: DC

## 2023-01-25 NOTE — Addendum Note (Signed)
Addended by: Warden Fillers on: 01/25/2023 03:15 PM   Modules accepted: Orders

## 2023-01-25 NOTE — Telephone Encounter (Signed)
B12 sent in and Lyrica pended for approval

## 2023-01-28 ENCOUNTER — Other Ambulatory Visit: Payer: Self-pay | Admitting: Internal Medicine

## 2023-01-28 ENCOUNTER — Encounter: Payer: Self-pay | Admitting: Medical

## 2023-01-28 ENCOUNTER — Ambulatory Visit: Payer: Medicare Other | Attending: Medical | Admitting: Medical

## 2023-01-28 VITALS — BP 120/78 | HR 74 | Ht 64.0 in | Wt 250.4 lb

## 2023-01-28 DIAGNOSIS — I25118 Atherosclerotic heart disease of native coronary artery with other forms of angina pectoris: Secondary | ICD-10-CM | POA: Diagnosis not present

## 2023-01-28 DIAGNOSIS — I5032 Chronic diastolic (congestive) heart failure: Secondary | ICD-10-CM | POA: Diagnosis not present

## 2023-01-28 MED ORDER — FUROSEMIDE 20 MG PO TABS: 20.0000 mg | ORAL_TABLET | Freq: Every day | ORAL | 3 refills | Status: DC

## 2023-01-28 NOTE — Progress Notes (Signed)
Cardiology Office Note:    Date:  01/28/2023   ID:  Denise Sharp, DOB 30-Apr-1946, MRN [id number]  PCP:  Sherlene Shams, MD  [facility] Cardiologist:  [name], MD  [facility] Electrophysiologist:  None   Referring MD: Sherlene Shams, MD   Chief Complaint: 2 month follow-up  History of Present Illness:    Denise Sharp is a 77 y.o. female with a hx of CAD status post PCI to the ostial and mid LAD in the setting of unstable angina in 12/2019, chronic HFpEF, A-fib, hypertension, type 2 diabetes, iron deficiency anemia following gastric bypass x 2, IBS, esophageal stenosis, chronic dizziness with gait instability, cervicogenic headache with cervical spinal stenosis, OSA on CPAP, restless leg syndrome and adrenal mass who presents for follow-up.   Patient's visit in April 2024 she was found to be in A-fib.  She reported fatigue and shortness of breath.  Patient was set up for TEE cardioversion which she underwent 10/30/22.  She was last seen 11/27/22 and was in NSR.   Patient went to the ER in June and thought it was her heart.  Cardiac workup was negative.  CT showed esophageal thickening, which may be related to GERD or esophagitis.  It was felt symptoms were GI related she was recommended to follow-up with GI doctor. She will undergo GI procedure in August.   Today, patient reports chronic fatigue.  It seems that this is longstanding and unchanged.  She reports atypical chest discomfort. It can happen a night or during the day and it's not related to eating. It is below the left breast.  She has pain had pain while pressing in the area. She ports SOB is most likely from weight gain.  She reports worsening swelling and is requesting to go back on Lasix daily.  According to pharmacy recommendations, she can hold Eliquis 1-2 prior to procedure.   Past Medical History:  Diagnosis Date   (HFpEF) heart failure with preserved ejection fraction (HCC)    a. 12/2019 Echo: EF  65-70%, Gr2 DD. No significant valvular dzs.   Abnormality of gait 03/25/2013   Adrenal mass, left (HCC)    Anginal pain (HCC)    Anxiety    Aortic atherosclerosis (HCC)    Arthritis    Asthma    CAD (coronary artery disease) with h/o Atypical Chest Pain    a. 04/1986 Cath ([facility]): nl cors, EF 65%; b. 07/2012 Cath [facility]): Diff minor irregs; c. 07/2016 MV [facility]): "Equivocal"; d. 08/2016 Cardiac CT Ca2+ score [facility]): Ca2+ 1548; e. 05/2019 MV: No ischemia. EF 75%; f. 12/2019 PCI: LM nl, LAD 65ost (3.5x12 Resolute DES), 78m (2.75x12 Resolute DES),LCX/RCA nl; g. 08/2020 Cath: patent LAD stents, RCA 40ost, elev RH pressures, EF >65%.   Cervical spinal stenosis 1994   due to trauma to back ([employer] accident), has intermittent paralysis and parasthesias   Cervicogenic headache 03/23/2014   CKD (chronic kidney disease), stage III (HCC)    Depression    Diverticulosis    Dizziness    a.) chronic   DJD (degenerative joint disease)    a. Chronic R shoulder pain   Dyspnea    Esophageal stenosis 09/2009   a.) transient outlet obstruction by food, cleared by EGD   Family history of adverse reaction to anesthesia    a.) daughter with (+) PONV   Gastric bypass status for obesity    GERD (gastroesophageal reflux disease)    Headache(784.0)    HLD (hyperlipidemia)  Hypertension    IBS (irritable bowel syndrome)    IDA (iron deficiency anemia)    a.) post 2 unit txfsn 2009, normal endo/colonoscopy by [facility]   Left bundle branch block (LBBB)    a.) Intermittently present - likely rate related.   NSVT (nonsustained ventricular tachycardia) (HCC) 11/15/2020   a.) Holter study 11/15/2020; 11 episodes of NSVT lasting up to 8 beats with a maximum rate of 210 bpm; 14 atrial runs lasting up to 18 beats with a rate of up to 250 bpm.  Some atrial runs felt to be NSVT.   NSVT (nonsustained ventricular tachycardia) (HCC)    a. 10/2020 [facility]: 11 runs of NSVT up to 8 beats.   Obesity    OSA on CPAP    Polyneuropathy in  diabetes(357.2) 03/25/2013   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/09/2021   Right V1 distribution   PSVT (paroxysmal supraventricular tachycardia)    a. 10/2020 [facility]: 14 atrial runs up to 18 beats, max HR 250.   Restless leg syndrome    Rotator cuff arthropathy, right 08/13/2017   Syncope and collapse 03/12/2014   Type II diabetes mellitus (HCC)     Past Surgical History:  Procedure Laterality Date   APPENDECTOMY     BACK SURGERY     CARDIOVERSION N/A 10/30/2022   Procedure: CARDIOVERSION;  Surgeon: [name], MD;  Location: [facility] ORS;  Service: Cardiovascular;  Laterality: N/A;   CHOLECYSTECTOMY N/A 1976   COLONOSCOPY     CORONARY ANGIOPLASTY     CORONARY STENT INTERVENTION N/A 01/04/2020   Procedure: CORONARY STENT INTERVENTION;  Surgeon: Iran Ouch, MD;  Location: [facility] INVASIVE CV LAB;  Service: Cardiovascular;  Laterality: N/A;  LAD    DIAPHRAGMATIC HERNIA REPAIR  2015   ESOPHAGEAL DILATION     multiple   ESOPHAGOGASTRODUODENOSCOPY (EGD) WITH PROPOFOL N/A 05/11/2021   Procedure: ESOPHAGOGASTRODUODENOSCOPY (EGD) WITH PROPOFOL;  Surgeon: [name], MD;  Location: [facility] ENDOSCOPY;  Service: Endoscopy;  Laterality: N/A;   ESOPHAGOGASTRODUODENOSCOPY (EGD) WITH PROPOFOL N/A 06/13/2021   Procedure: ESOPHAGOGASTRODUODENOSCOPY (EGD) WITH PROPOFOL;  Surgeon: [name], MD;  Location: [facility] ENDOSCOPY;  Service: Endoscopy;  Laterality: N/A;   EYE SURGERY     GASTRIC BYPASS  2000, 2005   Dr. Arlana Pouch IMPLANT PLACEMENT  10/2011   Jeannette How IMPLANT PLACEMENT N/A 09/18/2021   Procedure: Leane Platt IMPLANT FIRST STAGE;  Surgeon: Alfredo Martinez, MD;  Location: [facility] ORS;  Service: Urology;  Laterality: N/A;   INTERSTIM IMPLANT PLACEMENT N/A 09/18/2021   Procedure: Leane Platt IMPLANT SECOND STAGE WITH IMPEDENCE CHECK;  Surgeon: Alfredo Martinez, MD;  Location: [facility] ORS;  Service: Urology;  Laterality: N/A;   INTERSTIM IMPLANT REMOVAL N/A 09/18/2021   Procedure: REMOVAL  OF INTERSTIM IMPLANT;  Surgeon: Alfredo Martinez, MD;  Location: [facility] ORS;  Service: Urology;  Laterality: N/A;   LEFT HEART CATH AND CORONARY ANGIOGRAPHY N/A 04/1986   LEFT HEART CATH AND CORONARY ANGIOGRAPHY Left 07/24/2012   LEFT HEART CATH AND CORS/GRAFTS ANGIOGRAPHY N/A 12/31/2019   Procedure: LEFT HEART CATH AND CORS/GRAFTS ANGIOGRAPHY;  Surgeon: Antonieta Iba, MD;  Location: [facility] INVASIVE CV LAB;  Service: Cardiovascular;  Laterality: N/A;   PANNICULECTOMY N/A 06/16/2019   Procedure: PANNICULECTOMY;  Surgeon: Allena Napoleon, MD;  Location: [facility] OR;  Service: Plastics;  Laterality: N/A;  3 hours, please   REVERSE SHOULDER ARTHROPLASTY Right 08/13/2017   Procedure: REVERSE RIGHT SHOULDER ARTHROPLASTY;  Surgeon: Teryl Lucy, MD;  Location: [facility] OR;  Service: Orthopedics;  Laterality: Right;  RIGHT/LEFT HEART CATH AND CORONARY ANGIOGRAPHY N/A 09/06/2020   Procedure: RIGHT/LEFT HEART CATH AND CORONARY ANGIOGRAPHY;  Surgeon: [name], MD;  Location: [facility] INVASIVE CV LAB;  Service: Cardiovascular;  Laterality: N/A;   RIGHT/LEFT HEART CATH AND CORONARY ANGIOGRAPHY N/A 06/04/2022   Procedure: RIGHT/LEFT HEART CATH AND CORONARY ANGIOGRAPHY;  Surgeon: [name], MD;  Location: [facility] CV LAB;  Service: Cardiovascular;  Laterality: N/A;   ROTATOR CUFF REPAIR Right    SPINE SURGERY  1995   [place]   TOOTH EXTRACTION  11/13/2021   TOTAL ABDOMINAL HYSTERECTOMY W/ BILATERAL SALPINGOOPHORECTOMY  1974   TOTAL KNEE ARTHROPLASTY Bilateral 2007   Procedure: TOTAL KNEE ARTHROPLASTY; Surgeons: Gerrit Heck, MD and [name], MD   UMBILICAL HERNIA REPAIR  08/11/2015    Current Medications: Current Meds  Medication Sig   albuterol (VENTOLIN HFA) 108 (90 Base) MCG/ACT inhaler Inhale 1-2 puffs into the lungs every 6 (six) hours as needed for wheezing or shortness of breath.   apixaban (ELIQUIS) 5 MG TABS tablet Take 1 tablet (5 mg total) by mouth 2 (two) times daily.   atorvastatin (LIPITOR)  80 MG tablet Take 1 tablet (80 mg total) by mouth daily.   benzonatate (TESSALON) 200 MG capsule Take 1 capsule (200 mg total) by mouth 2 (two) times daily as needed for cough.   butalbital-acetaminophen-caffeine (FIORICET) 50-325-40 MG tablet TAKE 1 TABLET BY MOUTH EVERY 6 HOURS AS NEEDED FOR HEADACHE.   Cholecalciferol (VITAMIN D3) 250 MCG (10000 UT) capsule Take 10,000 Units by mouth daily.   clotrimazole-betamethasone (LOTRISONE) cream Apply 1 application topically 2 (two) times daily as needed (irritation).   conjugated estrogens (PREMARIN) vaginal cream Apply one pea-sized amount around the opening of the urethra daily for 2 weeks, then 3 times weekly moving forward.   Continuous Blood Gluc Receiver (FREESTYLE LIBRE 2 READER) DEVI Use to check sugar at least TID   cyanocobalamin (VITAMIN B12) 1000 MCG/ML injection Inject 1 mL (1,000 mcg total) into the muscle every 30 (thirty) days. Inject 1 ml (1000 mcg ) IM weekly x 4,  Then monthly thereafter   dapagliflozin propanediol (FARXIGA) 10 MG TABS tablet Take 1 tablet (10 mg total) by mouth daily.   dicyclomine (BENTYL) 20 MG tablet Take 1 tablet (20 mg total) by mouth 3 (three) times daily before meals.   DULoxetine (CYMBALTA) 30 MG capsule Take 1 capsule (30 mg total) by mouth daily.   ezetimibe (ZETIA) 10 MG tablet Take 1 tablet (10 mg total) by mouth daily.   fexofenadine (ALLEGRA) 180 MG tablet Take 180 mg by mouth as needed for allergies or rhinitis.   fluticasone (FLONASE) 50 MCG/ACT nasal spray Place 2 sprays into both nostrils daily. (Patient taking differently: Place 2 sprays into both nostrils 3 (three) times daily as needed for allergies.)   Glucagon (GVOKE HYPOPEN 2-PACK) 0.5 MG/0.1ML SOAJ Inject 1 auto injector subcutaneously as needed for severe hypoglycemia. May repeat x 1 after 15 minutes if needed.   glucose blood (ONETOUCH VERIO) test strip Use to check blood sugar twice daily.   insulin aspart (NOVOLOG FLEXPEN) 100 UNIT/ML  FlexPen Inject 10 Units into the skin 3 (three) times daily with meals. If pre-meal glucose <100, ADD  2 units; 100-150 - ADD 4 units, 151-200 -add  6 units; 201-250 - add 8 units; 251-300 - add 10 units, 301-350 - iadd 12 units ; 350 > add 14 units);   insulin degludec (TRESIBA FLEXTOUCH) 100 UNIT/ML FlexTouch Pen Inject 12 Units into the skin daily.  Insulin lispro (HUMALOG JUNIOR KWIKPEN) 100 UNIT/ML 10-20 units qac usign sliding scale   Insulin Pen Needle (DROPLET PEN NEEDLES) 31G X 5 MM MISC USE ONE NEEDLE SUBCUTANEOUSLY AS DIRECTED (REMOVE AND DISCARD NEEDLE IN SHARPS CONTAINER IMMEDIATELY AFTER USE)   loperamide (IMODIUM) 2 MG capsule Take 4-6 mg by mouth 3 (three) times daily before meals.   magnesium gluconate (MAGONATE) 500 MG tablet Take 500 mg by mouth daily.   metoprolol succinate (TOPROL-XL) 25 MG 24 hr tablet Take 0.5 tablets (12.5 mg total) by mouth daily. Take with or immediately following a meal.   nitroGLYCERIN (NITROSTAT) 0.4 MG SL tablet Place 1 tablet (0.4 mg total) under the tongue every 5 (five) minutes as needed for chest pain.   nystatin (MYCOSTATIN/NYSTOP) powder Apply 1 g topically 4 (four) times daily as needed (rash).   ondansetron (ZOFRAN-ODT) 4 MG disintegrating tablet Take 1 tablet (4 mg total) by mouth every 8 (eight) hours as needed for nausea or vomiting.   pantoprazole (PROTONIX) 40 MG tablet Take 1 tablet (40 mg total) by mouth daily.   Polyethyl Glycol-Propyl Glycol (SYSTANE OP) Place 1 drop into both eyes 4 (four) times daily as needed (dry eyes).   pregabalin (LYRICA) 50 MG capsule Take 1 capsule (50 mg total) by mouth 2 (two) times daily.   sucralfate (CARAFATE) 1 g tablet Take 1 tablet (1 g total) by mouth 3 (three) times daily before meals.     Allergies:   Demeclocycline, Erythromycin, Flagyl [metronidazole], Glucophage [metformin hcl], Tetracyclines & related, Furosemide, Tetracycline, Diovan [valsartan], Sulfa antibiotics, and Xanax [alprazolam]    Social History   Socioeconomic History   Marital status: Married    Spouse name: Alinda Money    Number of children: 2   Years of education: College    Highest education level: Not on file  Occupational History    Employer: retired  Tobacco Use   Smoking status: Never    Passive exposure: Never   Smokeless tobacco: Never  Vaping Use   Vaping status: Never Used  Substance and Sexual Activity   Alcohol use: No   Drug use: Not Currently    Comment: prescribed valium   Sexual activity: Not Currently    Birth control/protection: Post-menopausal, Surgical  Other Topics Concern   Not on file  Social History Narrative   Patient lives at home with husband Alinda Money.    Right Handed   Drinks caffeinated tea occasionally   One pet in house, dog   Social Determinants of Health   Financial Resource Strain: Low Risk  (09/15/2021)   Overall Financial Resource Strain (CARDIA)    Difficulty of Paying Living Expenses: Not hard at all  Food Insecurity: No Food Insecurity (10/19/2020)   Hunger Vital Sign    Worried About Running Out of Food in the Last Year: Never true    Ran Out of Food in the Last Year: Never true  Transportation Needs: No Transportation Needs (10/19/2020)   PRAPARE - Administrator, Civil Service (Medical): No    Lack of Transportation (Non-Medical): No  Physical Activity: Insufficiently Active (11/15/2020)   Exercise Vital Sign    Days of Exercise per Week: 2 days    Minutes of Exercise per Session: 40 min  Stress: Stress Concern Present (08/03/2020)   [employer] of Occupational Health - Occupational Stress Questionnaire    Feeling of Stress : Rather much  Social Connections: Not on file     Family History: The patient's family history includes  Depression in her mother; Diabetes in her daughter; Headache in her mother; Heart disease in her daughter, father, and mother; Hypertension in her daughter, father, mother, and son; Osteoporosis in her father; Prostate  cancer in her father; Stroke in her father and mother; Thyroid disease in her mother.  ROS:   Please see the history of present illness.     All other systems reviewed and are negative.  EKGs/Labs/Other Studies Reviewed:    The following studies were reviewed today:  R/L Cardiac cath 05/2022 Conclusions: Mild-moderate, non-obstructive coronary artery disease, similar to prior catheterization in 08/2020. Widely patent ostial LAD stent. Patent mid LAD stent with minimal in stent restenosis (~10% narrowing). Upper normal left and right heart filling pressures. Borderline elevated pulmonary artery pressure. Normal to mildly reduced cardiac output/index.   Recommendations: No obvious findings to explain the patient's progressive symptoms and NSVT on recent event monitor. Restart metoprolol succinate 25 mg daily. Aggressive secondary prevention of coronary artery disease and medical therapy for chronic HFpEF.   [name], MD [facility]   Heart monitor 05/2022    The patient was enrolled for 30 days.   The predominant rhythm was sinus with an average rate of 76 bpm (range 56-122 bpm).   Six episodes of nonsustained ventricular tachycardia were observed, lasting up to 11 beats.   No sustained arrhythmia or prolonged pause occurred.  There was no evidence of atrial fibrillation/flutter.   Patient triggered events correspond to sinus rhythm, PACs, and PVCs.   Predominantly sinus rhythm with several episodes of nonsustained ventricular tachycardia.  No atrial fibrillation/flutter identified.   Myoview lexiscan 01/2022 Narrative & Impression  Pharmacological myocardial perfusion imaging study with no significant  ischemia Normal wall motion, EF estimated at 95% No EKG changes concerning for ischemia at peak stress or in recovery. CT attenuation correction images with coronary calcification in the LAD Low risk scan   Signed, Dossie Arbour, MD, Ph.D [facility]    EKG:   EKG is ordered today.  The ekg ordered today demonstrates normal sinus rhythm, PVCs, PACs, 70 bpm.  Recent Labs: 05/31/2022: B Natriuretic Peptide 111.6 09/27/2022: Magnesium 2.0; TSH 2.887 12/13/2022: BUN 16; Creatinine, Ser 1.23; Hemoglobin 11.0; Platelets 224; Potassium 4.8; Sodium 138 12/14/2022: ALT 22  Recent Lipid Panel    Component Value Date/Time   CHOL 103 10/23/2022 1324   CHOL 155 02/27/2014 0420   TRIG 98 10/23/2022 1324   TRIG 121 02/27/2014 0420   HDL 38 (L) 10/23/2022 1324   HDL 43 02/27/2014 0420   CHOLHDL 2.7 10/23/2022 1324   VLDL 20 10/23/2022 1324   VLDL 24 02/27/2014 0420   LDLCALC 45 10/23/2022 1324   LDLCALC 114 (H) 07/13/2022 1529   LDLCALC 88 02/27/2014 0420   LDLDIRECT 126 (H) 07/13/2022 1529     Physical Exam:    VS:  BP 120/78 (BP Location: Left Arm, Patient Position: Sitting)   Pulse 74   Ht 5\' 4"  (1.626 m)   Wt 250 lb 6.4 oz (113.6 kg)   SpO2 95%   BMI 42.98 kg/m     Wt Readings from Last 3 Encounters:  01/28/23 250 lb 6.4 oz (113.6 kg)  01/07/23 254 lb 12.8 oz (115.6 kg)  12/25/22 253 lb 9.6 oz (115 kg)     GEN:  Well nourished, well developed in no acute distress HEENT: Normal NECK: No JVD; No carotid bruits LYMPHATICS: No lymphadenopathy CARDIAC: RRR, no murmurs, rubs, gallops RESPIRATORY:  Clear to auscultation without rales, wheezing or  rhonchi  ABDOMEN: Soft, non-tender, non-distended MUSCULOSKELETAL:  No edema; No deformity  SKIN: Warm and dry NEUROLOGIC:  Alert and oriented x 3 PSYCHIATRIC:  Normal affect   ASSESSMENT:    1. Coronary artery disease of native artery of native heart with stable angina pectoris [facility])    PLAN:    In order of problems listed above:  Pre-op evaluation for EGD Patient is scheduled for EGD on February 28, 2023 further investigate chest pain.  Prior cardiac workup has been overall reassuring.  Cardiac cath in November 2023 showed mild to moderate nonobstructive CAD, similar to prior cath in 2022,  widely patent ostial LAD stent, patent mid LAD stent.  Recent ER workup for chest pain was negative.  She reports persistent atypical chest pain that is reproducible on exam.  Patient remains in normal sinus rhythm on Eliquis.  According to pharmacy, patient can hold Eliquis 1-2 days prior to procedure.  Restart Eliquis postop per GI recommendations.  Patient reports swelling in her hands and legs, previously on Lasix.  She is requesting to go back on Lasix.  I will send in Lasix 20 mg daily, be met in 2 weeks.  METs greater than 4.  According to RCRI the patient is Class 3 risk, 10.1 % risk of death, MI, or cardiac arrest.  Okay to undergo EGD without further cardiac workup.  Persistent Afib Patient underwent DCCV 4/16. She remains in NSR. Continue toprol for rate control. OK to hold Eliquis 1-2 days prior to procedure as above.   HLD LDL 45. Continue Zetia and Lipitor 80mg  daily  CAD Cath in 05/2022 showed patent stents with otherwise mild CAD. No further ischemic work-up at this time.  No aspirin with Eliquis.  Continue Lipitor, Zetia, Toprol, sublingual nitro.  Disposition: Follow up in 4 month(s) with MD/APP    Signed, Nancyann Cotterman David Stall, PA-C  01/28/2023 3:23 PM    [facility]

## 2023-01-28 NOTE — Patient Instructions (Signed)
Medication Instructions:  Start Lasix (Furosemide) 20 mg once daily *If you need a refill on your cardiac medications before your next appointment, please call your pharmacy*   Lab Work: Your provider would like for you to return in 2 weeks to have the following labs drawn: BMP.   Please go to the [facility] entrance and check in at the front desk.  You do not need an appointment.  They are open from 7am-6 pm.  You don't need to be fasting.  If you have labs (blood work) drawn today and your tests are completely normal, you will receive your results only by: MyChart Message (if you have MyChart) OR A paper copy in the mail If you have any lab test that is abnormal or we need to change your treatment, we will call you to review the results.   Testing/Procedures: none   Follow-Up: At [facility], you and your health needs are our priority.  As part of our continuing mission to provide you with exceptional heart care, we have created designated Provider Care Teams.  These Care Teams include your primary Cardiologist (physician) and Advanced Practice Providers (APPs -  Physician Assistants and Nurse Practitioners) who all work together to provide you with the care you need, when you need it.  We recommend signing up for the patient portal called "MyChart".  Sign up information is provided on this After Visit Summary.  MyChart is used to connect with patients for Virtual Visits (Telemedicine).  Patients are able to view lab/test results, encounter notes, upcoming appointments, etc.  Non-urgent messages can be sent to your provider as well.   To learn more about what you can do with MyChart, go to [email or web address].    Your next appointment:   4 month(s)  Provider:   You may see [name], MD or one of the following Advanced Practice Providers on your designated Care Team:   Cadence Mackay, [place]

## 2023-01-29 ENCOUNTER — Telehealth: Payer: Self-pay | Admitting: Internal Medicine

## 2023-01-29 ENCOUNTER — Other Ambulatory Visit: Payer: Self-pay

## 2023-01-29 DIAGNOSIS — I4819 Other persistent atrial fibrillation: Secondary | ICD-10-CM

## 2023-01-29 MED ORDER — EZETIMIBE 10 MG PO TABS: 10.0000 mg | ORAL_TABLET | Freq: Every day | ORAL | 3 refills | Status: DC

## 2023-01-29 MED ORDER — FUROSEMIDE 20 MG PO TABS: 20.0000 mg | ORAL_TABLET | Freq: Every day | ORAL | 3 refills | Status: DC

## 2023-01-29 MED ORDER — APIXABAN 5 MG PO TABS
5.0000 mg | ORAL_TABLET | Freq: Two times a day (BID) | ORAL | 1 refills | Status: DC
Start: 2023-01-29 — End: 2023-06-19

## 2023-01-29 MED ORDER — METOPROLOL SUCCINATE ER 25 MG PO TB24: 12.5000 mg | ORAL_TABLET | Freq: Every day | ORAL | 3 refills | Status: DC

## 2023-01-29 NOTE — Telephone Encounter (Signed)
Patient notified. Patient verbalized understanding.

## 2023-01-29 NOTE — Telephone Encounter (Signed)
RX sent to pharmacy provided. Thank you!

## 2023-01-29 NOTE — Telephone Encounter (Signed)
*  STAT* If patient is at the pharmacy, call can be transferred to refill team.   1. Which medications need to be refilled? (please list name of each medication and dose if known) Lasix  2. Which pharmacy/location (including street and city if local pharmacy) is medication to be sent to? [facility] - [place], [place] - [address]  3. Do they need a 30 day or 90 day supply?   Patient states yesterday all her medications were sent to the wrong pharmacy. She knows Lasix was called in but does not remember the names of the other medications. Patient would like to have the prescriptions transferred to [facility] because she will not have to pay for it. Patient would like a call back to confirm when this has been taken care of.

## 2023-02-06 ENCOUNTER — Other Ambulatory Visit: Payer: Self-pay | Admitting: Internal Medicine

## 2023-02-11 ENCOUNTER — Ambulatory Visit: Payer: Medicare Other | Admitting: Neurology

## 2023-02-11 ENCOUNTER — Ambulatory Visit: Payer: Medicare Other | Admitting: Urology

## 2023-02-11 ENCOUNTER — Telehealth: Payer: Self-pay

## 2023-02-11 NOTE — Telephone Encounter (Signed)
Pre-op evaluation for EGD Patient is scheduled for EGD on February 28, 2023 further investigate chest pain.  Prior cardiac workup has been overall reassuring.  Cardiac cath in November 2023 showed mild to moderate nonobstructive CAD, similar to prior cath in 2022, widely patent ostial LAD stent, patent mid LAD stent.  Recent ER workup for chest pain was negative.  She reports persistent atypical chest pain that is reproducible on exam.  Patient remains in normal sinus rhythm on Eliquis.  According to pharmacy, patient can hold Eliquis 1-2 days prior to procedure.  Restart Eliquis postop per GI recommendations.  Okay to undergo EGD without further cardiac workup.   Persistent Afib Patient underwent DCCV 4/16. She remains in NSR. Continue toprol for rate control. OK to hold Eliquis 1-2 days prior to procedure as above.   Signed, Cadence David Stall, PA-C  01/28/2023 3:23 PM    [facility]

## 2023-02-13 ENCOUNTER — Ambulatory Visit: Payer: Medicare Other | Admitting: Neurology

## 2023-02-13 ENCOUNTER — Other Ambulatory Visit: Admission: RE | Admit: 2023-02-13 | Payer: Medicare Other | Source: Home / Self Care

## 2023-02-13 ENCOUNTER — Other Ambulatory Visit
Admission: RE | Admit: 2023-02-13 | Discharge: 2023-02-13 | Disposition: A | Discharge status: Home / Self Care | Payer: Medicare Other | Attending: Medical | Admitting: Medical

## 2023-02-13 DIAGNOSIS — I25118 Atherosclerotic heart disease of native coronary artery with other forms of angina pectoris: Secondary | ICD-10-CM

## 2023-02-13 DIAGNOSIS — I5032 Chronic diastolic (congestive) heart failure: Secondary | ICD-10-CM

## 2023-02-13 LAB — BASIC METABOLIC PANEL
Anion gap: 9 (ref 5–15)
BUN: 22 mg/dL (ref 8–23)
CO2: 24 mmol/L (ref 22–32)
Calcium: 8.5 mg/dL — ABNORMAL LOW (ref 8.9–10.3)
Chloride: 104 mmol/L (ref 98–111)
Creatinine, Ser: 1.45 mg/dL — ABNORMAL HIGH (ref 0.44–1.00)
GFR, Estimated: 37 mL/min — ABNORMAL LOW (ref >60)
Glucose, Bld: 217 mg/dL — ABNORMAL HIGH (ref 70–99)
Potassium: 4.1 mmol/L (ref 3.5–5.1)
Sodium: 137 mmol/L (ref 135–145)

## 2023-02-18 ENCOUNTER — Telehealth: Payer: Self-pay | Admitting: Internal Medicine

## 2023-02-18 ENCOUNTER — Other Ambulatory Visit: Payer: Self-pay

## 2023-02-18 DIAGNOSIS — I5032 Chronic diastolic (congestive) heart failure: Secondary | ICD-10-CM

## 2023-02-18 NOTE — Telephone Encounter (Signed)
Pt called stating her insurance would not pay for her insulin anymore so she has to go to Humalog and that goes [facility] not to local pharmacy. Pt would like to be called regarding this

## 2023-02-20 ENCOUNTER — Other Ambulatory Visit: Payer: Self-pay | Admitting: Internal Medicine

## 2023-02-20 MED ORDER — HUMALOG JUNIOR KWIKPEN 100 UNIT/ML ~~LOC~~ SOPN: PEN_INJECTOR | SUBCUTANEOUS | 2 refills | Status: DC

## 2023-02-20 NOTE — Addendum Note (Signed)
Addended by: Sandy Salaam on: 02/20/2023 05:17 PM   Modules accepted: Orders

## 2023-02-20 NOTE — Telephone Encounter (Signed)
Rx was resent to [facility] order pharmacy.

## 2023-02-25 NOTE — Telephone Encounter (Signed)
Left detailed msg on VM per HIPAA  

## 2023-02-28 ENCOUNTER — Encounter
Admission: RE | Disposition: A | Discharge status: Home / Self Care | Payer: Self-pay | Source: Home / Self Care | Attending: Gastroenterology

## 2023-02-28 ENCOUNTER — Ambulatory Visit: Payer: Medicare Other | Admitting: Anesthesiology

## 2023-02-28 ENCOUNTER — Ambulatory Visit
Admission: RE | Admit: 2023-02-28 | Discharge: 2023-02-28 | Disposition: A | Discharge status: Home / Self Care | Payer: Medicare Other | Attending: Gastroenterology | Admitting: Gastroenterology

## 2023-02-28 ENCOUNTER — Encounter: Payer: Self-pay | Admitting: Gastroenterology

## 2023-02-28 DIAGNOSIS — Z7901 Long term (current) use of anticoagulants: Secondary | ICD-10-CM | POA: Diagnosis not present

## 2023-02-28 DIAGNOSIS — R2689 Other abnormalities of gait and mobility: Secondary | ICD-10-CM | POA: Insufficient documentation

## 2023-02-28 DIAGNOSIS — J45909 Unspecified asthma, uncomplicated: Secondary | ICD-10-CM | POA: Diagnosis not present

## 2023-02-28 DIAGNOSIS — E1122 Type 2 diabetes mellitus with diabetic chronic kidney disease: Secondary | ICD-10-CM | POA: Diagnosis not present

## 2023-02-28 DIAGNOSIS — N183 Chronic kidney disease, stage 3 unspecified: Secondary | ICD-10-CM | POA: Diagnosis not present

## 2023-02-28 DIAGNOSIS — K219 Gastro-esophageal reflux disease without esophagitis: Secondary | ICD-10-CM | POA: Diagnosis not present

## 2023-02-28 DIAGNOSIS — R1310 Dysphagia, unspecified: Secondary | ICD-10-CM | POA: Diagnosis not present

## 2023-02-28 DIAGNOSIS — I5032 Chronic diastolic (congestive) heart failure: Secondary | ICD-10-CM | POA: Insufficient documentation

## 2023-02-28 DIAGNOSIS — F418 Other specified anxiety disorders: Secondary | ICD-10-CM | POA: Insufficient documentation

## 2023-02-28 DIAGNOSIS — Z8249 Family history of ischemic heart disease and other diseases of the circulatory system: Secondary | ICD-10-CM | POA: Diagnosis not present

## 2023-02-28 DIAGNOSIS — Z9884 Bariatric surgery status: Secondary | ICD-10-CM | POA: Diagnosis not present

## 2023-02-28 DIAGNOSIS — I2510 Atherosclerotic heart disease of native coronary artery without angina pectoris: Secondary | ICD-10-CM | POA: Insufficient documentation

## 2023-02-28 DIAGNOSIS — G709 Myoneural disorder, unspecified: Secondary | ICD-10-CM | POA: Diagnosis not present

## 2023-02-28 DIAGNOSIS — I130 Hypertensive heart and chronic kidney disease with heart failure and stage 1 through stage 4 chronic kidney disease, or unspecified chronic kidney disease: Secondary | ICD-10-CM | POA: Insufficient documentation

## 2023-02-28 DIAGNOSIS — Z6841 Body Mass Index (BMI) 40.0 and over, adult: Secondary | ICD-10-CM | POA: Diagnosis not present

## 2023-02-28 DIAGNOSIS — Z833 Family history of diabetes mellitus: Secondary | ICD-10-CM | POA: Insufficient documentation

## 2023-02-28 DIAGNOSIS — K222 Esophageal obstruction: Secondary | ICD-10-CM | POA: Diagnosis not present

## 2023-02-28 DIAGNOSIS — Z7984 Long term (current) use of oral hypoglycemic drugs: Secondary | ICD-10-CM | POA: Insufficient documentation

## 2023-02-28 DIAGNOSIS — Z955 Presence of coronary angioplasty implant and graft: Secondary | ICD-10-CM | POA: Diagnosis not present

## 2023-02-28 DIAGNOSIS — G4733 Obstructive sleep apnea (adult) (pediatric): Secondary | ICD-10-CM | POA: Insufficient documentation

## 2023-02-28 DIAGNOSIS — Z794 Long term (current) use of insulin: Secondary | ICD-10-CM | POA: Diagnosis not present

## 2023-02-28 DIAGNOSIS — R1319 Other dysphagia: Secondary | ICD-10-CM | POA: Diagnosis not present

## 2023-02-28 DIAGNOSIS — I4891 Unspecified atrial fibrillation: Secondary | ICD-10-CM | POA: Diagnosis not present

## 2023-02-28 DIAGNOSIS — D631 Anemia in chronic kidney disease: Secondary | ICD-10-CM | POA: Diagnosis not present

## 2023-02-28 HISTORY — PX: ESOPHAGOGASTRODUODENOSCOPY (EGD) WITH PROPOFOL: SHX5813

## 2023-02-28 LAB — GLUCOSE, CAPILLARY: Glucose-Capillary: 191 mg/dL — ABNORMAL HIGH (ref 70–99)

## 2023-02-28 SURGERY — ESOPHAGOGASTRODUODENOSCOPY (EGD) WITH PROPOFOL
Anesthesia: General

## 2023-02-28 MED ORDER — PROPOFOL 10 MG/ML IV BOLUS
INTRAVENOUS | Status: DC | PRN
Start: 2023-02-28 — End: 2023-02-28
  Administered 2023-02-28: 70 mg via INTRAVENOUS
  Administered 2023-02-28: 30 mg via INTRAVENOUS

## 2023-02-28 MED ORDER — LIDOCAINE HCL (CARDIAC) PF 100 MG/5ML IV SOSY
PREFILLED_SYRINGE | INTRAVENOUS | Status: DC | PRN
  Administered 2023-02-28: 100 mg via INTRAVENOUS

## 2023-02-28 MED ORDER — SODIUM CHLORIDE 0.9 % IV SOLN: INTRAVENOUS | Status: DC

## 2023-02-28 MED ORDER — DEXMEDETOMIDINE HCL IN NACL 80 MCG/20ML IV SOLN
INTRAVENOUS | Status: DC | PRN
  Administered 2023-02-28: 8 ug via INTRAVENOUS
  Administered 2023-02-28: 4 ug via INTRAVENOUS

## 2023-02-28 NOTE — H&P (Signed)
Denise Minium, MD [facility] [address] [place], [place] [address] Phone:[phone] Fax : [phone]  Primary Care Physician:  Denise Shams, MD Primary Gastroenterologist:  Denise Sharp  Pre-Procedure History & Physical: HPI:  Denise Sharp is a 77 y.o. female is here for an endoscopy.   Past Medical History:  Diagnosis Date   (HFpEF) heart failure with preserved ejection fraction (HCC)    a. 12/2019 Echo: EF 65-70%, Gr2 DD. No significant valvular dzs.   Abnormality of gait 03/25/2013   Adrenal mass, left (HCC)    Anginal pain (HCC)    Anxiety    Aortic atherosclerosis (HCC)    Arthritis    Asthma    CAD (coronary artery disease) with h/o Atypical Chest Pain    a. 04/1986 Cath ([facility]): nl cors, EF 65%; b. 07/2012 Cath [facility]): Diff minor irregs; c. 07/2016 MV [facility]): "Equivocal"; d. 08/2016 Cardiac CT Ca2+ score [facility]): Ca2+ 1548; e. 05/2019 MV: No ischemia. EF 75%; f. 12/2019 PCI: LM nl, LAD 65ost (3.5x12 Resolute DES), 15m (2.75x12 Resolute DES),LCX/RCA nl; g. 08/2020 Cath: patent LAD stents, RCA 40ost, elev RH pressures, EF >65%.   Cervical spinal stenosis 1994   due to trauma to back ([employer] accident), has intermittent paralysis and parasthesias   Cervicogenic headache 03/23/2014   CKD (chronic kidney disease), stage III (HCC)    Depression    Diverticulosis    Dizziness    a.) chronic   DJD (degenerative joint disease)    a. Chronic R shoulder pain   Dyspnea    Esophageal stenosis 09/2009   a.) transient outlet obstruction by food, cleared by EGD   Family history of adverse reaction to anesthesia    a.) daughter with (+) PONV   Gastric bypass status for obesity    GERD (gastroesophageal reflux disease)    Headache(784.0)    HLD (hyperlipidemia)    Hypertension    IBS (irritable bowel syndrome)    IDA (iron deficiency anemia)    a.) post 2 unit txfsn 2009, normal endo/colonoscopy by    Left bundle branch block (LBBB)    a.) Intermittently present -  likely rate related.   NSVT (nonsustained ventricular tachycardia) (HCC) 11/15/2020   a.) Holter study 11/15/2020; 11 episodes of NSVT lasting up to 8 beats with a maximum rate of 210 bpm; 14 atrial runs lasting up to 18 beats with a rate of up to 250 bpm.  Some atrial runs felt to be NSVT.   NSVT (nonsustained ventricular tachycardia) (HCC)    a. 10/2020 [facility]: 11 runs of NSVT up to 8 beats.   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 10/2020 [facility]: 14 atrial runs up to 18 beats, max HR 250.   Restless leg syndrome    Rotator cuff arthropathy, right 08/13/2017   Syncope and collapse 03/12/2014   Type II diabetes mellitus (HCC)     Past Surgical History:  Procedure Laterality Date   APPENDECTOMY     BACK SURGERY     CARDIOVERSION N/A 10/30/2022   Procedure: CARDIOVERSION;  Surgeon: Denise Kendall, MD;  Location: [facility];  Service: Cardiovascular;  Laterality: N/A;   CHOLECYSTECTOMY N/A 1976   COLONOSCOPY     CORONARY ANGIOPLASTY     CORONARY STENT INTERVENTION N/A 01/04/2020   Procedure: CORONARY STENT INTERVENTION;  Surgeon: Denise Sharp  A, MD;  Location: [facility];  Service: Cardiovascular;  Laterality: N/A;  LAD    DIAPHRAGMATIC HERNIA REPAIR  2015   ESOPHAGEAL DILATION     multiple   ESOPHAGOGASTRODUODENOSCOPY (EGD) WITH PROPOFOL N/A 05/11/2021   Procedure: ESOPHAGOGASTRODUODENOSCOPY (EGD) WITH PROPOFOL;  Surgeon: Denise Minium, MD;  Location: [facility];  Service: Endoscopy;  Laterality: N/A;   ESOPHAGOGASTRODUODENOSCOPY (EGD) WITH PROPOFOL N/A 06/13/2021   Procedure: ESOPHAGOGASTRODUODENOSCOPY (EGD) WITH PROPOFOL;  Surgeon: Denise Minium, MD;  Location: [facility];  Service: Endoscopy;  Laterality: N/A;   EYE SURGERY     GASTRIC BYPASS  2000, 2005   Dr. Arlana Pouch IMPLANT PLACEMENT  10/2011   Denise Sharp IMPLANT PLACEMENT N/A 09/18/2021   Procedure: Leane Platt IMPLANT FIRST STAGE;  Surgeon: Denise Martinez, MD;  Location: [facility];  Service: Urology;  Laterality: N/A;   INTERSTIM IMPLANT PLACEMENT N/A 09/18/2021   Procedure: Leane Platt IMPLANT SECOND STAGE WITH IMPEDENCE CHECK;  Surgeon: Denise Martinez, MD;  Location: [facility];  Service: Urology;  Laterality: N/A;   INTERSTIM IMPLANT REMOVAL N/A 09/18/2021   Procedure: REMOVAL OF INTERSTIM IMPLANT;  Surgeon: Denise Martinez, MD;  Location: [facility];  Service: Urology;  Laterality: N/A;   LEFT HEART CATH AND CORONARY ANGIOGRAPHY N/A 04/1986   LEFT HEART CATH AND CORONARY ANGIOGRAPHY Left 07/24/2012   LEFT HEART CATH AND CORS/GRAFTS ANGIOGRAPHY N/A 12/31/2019   Procedure: LEFT HEART CATH AND CORS/GRAFTS ANGIOGRAPHY;  Surgeon: Denise Iba, MD;  Location: [facility];  Service: Cardiovascular;  Laterality: N/A;   PANNICULECTOMY N/A 06/16/2019   Procedure: PANNICULECTOMY;  Surgeon: Denise Napoleon, MD;  Location: [facility];  Service: Plastics;  Laterality: N/A;  3 hours, please   REVERSE SHOULDER ARTHROPLASTY Right 08/13/2017   Procedure: REVERSE RIGHT SHOULDER ARTHROPLASTY;  Surgeon: Denise Lucy, MD;  Location: [facility];  Service: Orthopedics;  Laterality: Right;   RIGHT/LEFT HEART CATH AND CORONARY ANGIOGRAPHY N/A 09/06/2020   Procedure: RIGHT/LEFT HEART CATH AND CORONARY ANGIOGRAPHY;  Surgeon: Denise Kendall, MD;  Location: [facility];  Service: Cardiovascular;  Laterality: N/A;   RIGHT/LEFT HEART CATH AND CORONARY ANGIOGRAPHY N/A 06/04/2022   Procedure: RIGHT/LEFT HEART CATH AND CORONARY ANGIOGRAPHY;  Surgeon: Denise Kendall, MD;  Location: [facility];  Service: Cardiovascular;  Laterality: N/A;   ROTATOR CUFF REPAIR Right    SPINE SURGERY  1995   [place]   TOOTH EXTRACTION  11/13/2021   TOTAL ABDOMINAL HYSTERECTOMY W/ BILATERAL SALPINGOOPHORECTOMY  1974   TOTAL KNEE ARTHROPLASTY Bilateral 2007    Procedure: TOTAL KNEE ARTHROPLASTY; Surgeons: Denise Heck, MD and [name], MD   UMBILICAL HERNIA REPAIR  08/11/2015    Prior to Admission medications   Medication Sig Start Date End Date Taking? Authorizing Provider  atorvastatin (LIPITOR) 80 MG tablet Take 1 tablet (80 mg total) by mouth daily. 11/12/22  Yes Denise Shams, MD  metoprolol succinate (TOPROL-XL) 25 MG 24 hr tablet Take 0.5 tablets (12.5 mg total) by mouth daily. Take with or immediately following a meal. 01/29/23  Yes End, Cristal Deer, MD  pregabalin (LYRICA) 50 MG capsule Take 1 capsule (50 mg total) by mouth 2 (two) times daily. 01/25/23  Yes Denise Shams, MD  albuterol (VENTOLIN HFA) 108 (90 Base) MCG/ACT inhaler Inhale 1-2 puffs into the lungs every 6 (six) hours as needed for wheezing or shortness of breath. 11/03/21   McLean-Scocuzza, Pasty Spillers, MD  apixaban (ELIQUIS) 5 MG TABS tablet Take 1 tablet (5 mg total)  by mouth 2 (two) times daily. 01/29/23   End, Cristal Deer, MD  benzonatate (TESSALON) 200 MG capsule Take 1 capsule (200 mg total) by mouth 2 (two) times daily as needed for cough. 07/13/22   Denise Shams, MD  butalbital-acetaminophen-caffeine (FIORICET) 50-325-40 MG tablet TAKE 1 TABLET BY MOUTH EVERY 6 HOURS AS NEEDED FOR HEADACHE. 07/23/22   Denise Shams, MD  Cholecalciferol (VITAMIN D3) 250 MCG (10000 UT) capsule Take 10,000 Units by mouth daily.    [provider]  clotrimazole-betamethasone (LOTRISONE) cream Apply 1 application topically 2 (two) times daily as needed (irritation).    [provider]  conjugated estrogens (PREMARIN) vaginal cream Apply one pea-sized amount around the opening of the urethra daily for 2 weeks, then 3 times weekly moving forward. 04/20/22   Carman Ching, PA-C  Continuous Blood Gluc Receiver (FREESTYLE LIBRE 2 READER) DEVI Use to check sugar at least TID 09/26/20   Denise Shams, MD  cyanocobalamin (VITAMIN B12) 1000 MCG/ML injection Inject 1 mL (1,000 mcg total)  into the muscle every 30 (thirty) days. Inject 1 ml (1000 mcg ) IM weekly x 4,  Then monthly thereafter 01/25/23   Denise Shams, MD  dapagliflozin propanediol (FARXIGA) 10 MG TABS tablet Take 1 tablet (10 mg total) by mouth daily. 11/12/22   Denise Shams, MD  dicyclomine (BENTYL) 20 MG tablet Take 1 tablet (20 mg total) by mouth 3 (three) times daily before meals. 08/07/22   Denise Minium, MD  DULoxetine (CYMBALTA) 30 MG capsule Take 1 capsule (30 mg total) by mouth daily. 08/28/22   Glean Salvo, NP  ezetimibe (ZETIA) 10 MG tablet Take 1 tablet (10 mg total) by mouth daily. 01/29/23 04/29/23  End, Cristal Deer, MD  fexofenadine (ALLEGRA) 180 MG tablet Take 180 mg by mouth as needed for allergies or rhinitis.    [provider]  fluticasone (FLONASE) 50 MCG/ACT nasal spray Place 2 sprays into both nostrils daily. Patient taking differently: Place 2 sprays into both nostrils 3 (three) times daily as needed for allergies. 04/11/22   Glori Luis, MD  furosemide (LASIX) 20 MG tablet Take 1 tablet (20 mg total) by mouth daily. 01/29/23 04/29/23  End, Cristal Deer, MD  Glucagon (GVOKE HYPOPEN 2-PACK) 0.5 MG/0.1ML SOAJ Inject 1 auto injector subcutaneously as needed for severe hypoglycemia. May repeat x 1 after 15 minutes if needed. 09/25/22   Bethanie Dicker, NP  glucose blood (ONETOUCH VERIO) test strip Use to check blood sugar twice daily. 02/23/22   Denise Shams, MD  insulin degludec (TRESIBA FLEXTOUCH) 100 UNIT/ML FlexTouch Pen Inject 12 Units into the skin daily. 11/12/22   Denise Shams, MD  Insulin lispro (HUMALOG JUNIOR KWIKPEN) 100 UNIT/ML 10-20 units qac usign sliding scale 02/20/23   Denise Shams, MD  Insulin Pen Needle (DROPLET PEN NEEDLES) 31G X 5 MM MISC USE ONE NEEDLE SUBCUTANEOUSLY AS DIRECTED (REMOVE AND DISCARD NEEDLE IN SHARPS CONTAINER IMMEDIATELY AFTER USE) 02/12/22   Denise Shams, MD  loperamide (IMODIUM) 2 MG capsule Take 4-6 mg by mouth 3 (three) times daily before  meals.    [provider]  magnesium gluconate (MAGONATE) 500 MG tablet Take 500 mg by mouth daily.    [provider]  nitroGLYCERIN (NITROSTAT) 0.4 MG SL tablet Place 1 tablet (0.4 mg total) under the tongue every 5 (five) minutes as needed for chest pain. 08/03/22   End, Cristal Deer, MD  nystatin (MYCOSTATIN/NYSTOP) powder Apply 1 g topically 4 (four) times daily  as needed (rash).    [provider]  ondansetron (ZOFRAN-ODT) 4 MG disintegrating tablet Take 1 tablet (4 mg total) by mouth every 8 (eight) hours as needed for nausea or vomiting. 07/13/22   Denise Shams, MD  OneTouch Delica Lancets 33G MISC Used to check blood sugar two times a day. 02/22/22   Denise Shams, MD  OVER THE COUNTER MEDICATION Apply 1 application  topically daily as needed (pain). [place] Gel topical pain reliever    [provider]  pantoprazole (PROTONIX) 40 MG tablet Take 1 tablet (40 mg total) by mouth daily. 01/07/23 01/02/24  Celso Amy, PA-C  Polyethyl Glycol-Propyl Glycol (SYSTANE OP) Place 1 drop into both eyes 4 (four) times daily as needed (dry eyes).    [provider]  sucralfate (CARAFATE) 1 g tablet Take 1 tablet (1 g total) by mouth 3 (three) times daily before meals. 01/07/23 01/02/24  Celso Amy, PA-C  SYRINGE-NEEDLE, DISP, 3 ML (LUER LOCK SAFETY SYRINGES) 25G X 1" 3 ML MISC Use to give b12 injections once monthly 12/27/22   Denise Shams, MD    Allergies as of 01/07/2023 - Review Complete 01/07/2023  Allergen Reaction Noted   Demeclocycline Hives 09/18/2013   Erythromycin Nausea And Vomiting and Other (See Comments) 08/17/2011   Flagyl [metronidazole] Nausea And Vomiting and Other (See Comments) 08/17/2011   Glucophage [metformin hcl] Nausea And Vomiting and Other (See Comments) 08/17/2011   Tetracyclines & related Hives and Rash 03/12/2011   Furosemide Swelling and Other (See Comments) 03/12/2011   Tetracycline Other (See Comments) 09/27/2022    Diovan [valsartan] Nausea Only 08/17/2011   Sulfa antibiotics Other (See Comments) and Rash 09/18/2013   Xanax [alprazolam] Other (See Comments) 08/17/2011    Family History  Problem Relation Age of Onset   Heart disease Father    Hypertension Father    Prostate cancer Father    Stroke Father    Osteoporosis Father    Stroke Mother    Depression Mother    Headache Mother    Heart disease Mother    Thyroid disease Mother    Hypertension Mother    Diabetes Daughter    Heart disease Daughter    Hypertension Daughter    Hypertension Son     Social History   Socioeconomic History   Marital status: Married    Spouse name: Alinda Money    Number of children: 2   Years of education: College    Highest education level: Not on file  Occupational History    Employer: retired  Tobacco Use   Smoking status: Never    Passive exposure: Never   Smokeless tobacco: Never  Vaping Use   Vaping status: Never Used  Substance and Sexual Activity   Alcohol use: No   Drug use: Not Currently    Comment: prescribed valium   Sexual activity: Not Currently    Birth control/protection: Post-menopausal, Surgical  Other Topics Concern   Not on file  Social History Narrative   Patient lives at home with husband Alinda Money.    Right Handed   Drinks caffeinated tea occasionally   One pet in house, dog   Social Determinants of Health   Financial Resource Strain: Low Risk  (09/15/2021)   Overall Financial Resource Strain (CARDIA)    Difficulty of Paying Living Expenses: Not hard at all  Food Insecurity: No Food Insecurity (10/19/2020)   Hunger Vital Sign    Worried About Running Out of Food in the Last Year:  Never true    Ran Out of Food in the Last Year: Never true  Transportation Needs: No Transportation Needs (10/19/2020)   PRAPARE - Administrator, Civil Service (Medical): No    Lack of Transportation (Non-Medical): No  Physical Activity: Insufficiently Active (11/15/2020)   Exercise Vital  Sign    Days of Exercise per Week: 2 days    Minutes of Exercise per Session: 40 min  Stress: Stress Concern Present (08/03/2020)   [employer] of Occupational Health - Occupational Stress Questionnaire    Feeling of Stress : Rather much  Social Connections: Not on file  Intimate Partner Violence: Not At Risk (10/19/2020)   Humiliation, Afraid, Rape, and Kick questionnaire    Fear of Current or Ex-Partner: No    Emotionally Abused: No    Physically Abused: No    Sexually Abused: No    Review of Systems: See HPI, otherwise negative ROS  Physical Exam: BP 110/70   Pulse 71   Temp (!) 97.3 F (36.3 C) (Temporal)   Resp 18   Ht 5\' 4"  (1.626 m)   Wt 115.6 kg   SpO2 100%   BMI 43.74 kg/m  General:   Alert,  pleasant and cooperative in NAD Head:  Normocephalic and atraumatic. Neck:  Supple; no masses or thyromegaly. Lungs:  Clear throughout to auscultation.    Heart:  Regular rate and rhythm. Abdomen:  Soft, nontender and nondistended. Normal bowel sounds, without guarding, and without rebound.   Neurologic:  Alert and  oriented x4;  grossly normal neurologically.  Impression/Plan: TARIYA BUEHL is here for an endoscopy to be performed for dysphagia  Risks, benefits, limitations, and alternatives regarding  endoscopy have been reviewed with the patient.  Questions have been answered.  All parties agreeable.   Denise Minium, MD  02/28/2023, 8:50 AM

## 2023-02-28 NOTE — Transfer of Care (Signed)
Immediate Anesthesia Transfer of Care Note  Patient: Denise Sharp  Procedure(s) Performed: ESOPHAGOGASTRODUODENOSCOPY (EGD) WITH PROPOFOL  Patient Location: Endoscopy Unit  Anesthesia Type:General  Level of Consciousness: drowsy  Airway & Oxygen Therapy: Patient Spontanous Breathing  Post-op Assessment: Report given to RN and Post -op Vital signs reviewed and stable  Post vital signs: Reviewed and stable  Last Vitals:  Vitals Value Taken Time  BP 93/56 02/28/23 0919  Temp    Pulse 69 02/28/23 0919  Resp 14 02/28/23 0919  SpO2 97 % 02/28/23 0919  Vitals shown include unfiled device data.  Last Pain:  Vitals:   02/28/23 0917  TempSrc:   PainSc: 0-No pain         Complications: No notable events documented.

## 2023-02-28 NOTE — Anesthesia Preprocedure Evaluation (Addendum)
Anesthesia Evaluation  Patient identified by MRN, date of birth, ID band Patient awake    Reviewed: Allergy & Precautions, NPO status , Patient's Chart, lab work & pertinent test results  History of Anesthesia Complications Negative for: history of anesthetic complications  Airway Mallampati: II  TM Distance: >3 FB Neck ROM: full    Dental no notable dental hx. (+) Edentulous Lower, Edentulous Upper   Pulmonary shortness of breath and with exertion, asthma , sleep apnea and Continuous Positive Airway Pressure Ventilation    Pulmonary exam normal        Cardiovascular hypertension, + CAD, + Cardiac Stents and +CHF  + dysrhythmias (LBBB) Atrial Fibrillation and Supra Ventricular Tachycardia   Per cardiology note 01/28/23 Patient is scheduled for EGD on February 28, 2023 further investigate chest pain.  Prior cardiac workup has been overall reassuring.  Cardiac cath in November 2023 showed mild to moderate nonobstructive CAD, similar to prior cath in 2022, widely patent ostial LAD stent, patent mid LAD stent.  Recent ER workup for chest pain was negative.  She reports persistent atypical chest pain that is reproducible on exam.  Patient remains in normal sinus rhythm on Eliquis.  According to pharmacy, patient can hold Eliquis 1-2 days prior to procedure.  Restart Eliquis postop per GI recommendations.  Patient reports swelling in her hands and legs, previously on Lasix.  She is requesting to go back on Lasix.  I will send in Lasix 20 mg daily, be met in 2 weeks.  METs greater than 4.  According to [employer]CR[employer] the patient is Class 3 risk, 10.1 % risk of death, MI, or cardiac arrest.  Okay to undergo EGD without further cardiac workup.   Neuro/Psych  Headaches PSYCHIATRIC DISORDERS Anxiety Depression    Chronic dizziness with gait instability   Neuromuscular disease    GI/Hepatic Neg liver ROS,GERD  Controlled,, Hx of esophageal stenosis     Endo/Other  diabetes  Morbid obesity  Renal/GU Renal disease  negative genitourinary   Musculoskeletal   Abdominal   Peds  Hematology  (+) Blood dyscrasia, anemia   Anesthesia Other Findings Past Medical History: No date: (HFpEF) heart failure with preserved ejection fraction (HCC)     Comment:  a. 12/2019 Echo: EF 65-70%, Gr2 DD. No significant               valvular dzs. 03/25/2013: Abnormality of gait No date: Adrenal mass, left (HCC) No date: Anginal pain (HCC) No date: Anxiety No date: Aortic atherosclerosis (HCC) No date: Arthritis No date: Asthma No date: CAD (coronary artery disease) with h/o Atypical Chest Pain     Comment:  a. 04/1986 Cath ([facility]): nl cors, EF 65%; b. 07/2012 Cath               [facility]): Diff minor irregs; c. 07/2016 MV [place]):               "Equivocal"; d. 08/2016 Cardiac CT Ca2+ score [facility]): Ca2+              1548; e. 05/2019 MV: No ischemia. EF 75%; f. 12/2019 PCI:               LM nl, LAD 65ost (3.5x12 Resolute DES), 47m (2.75x12               Resolute DES),LCX/RCA nl; g. 08/2020 Cath: patent LAD               stents, RCA 40ost, elev RH pressures, EF >65%.  1994: Cervical spinal stenosis     Comment:  due to trauma to back ([employer] accident), has               intermittent paralysis and parasthesias 03/23/2014: Cervicogenic headache No date: CKD (chronic kidney disease), stage III (HCC) No date: Depression No date: Diverticulosis No date: Dizziness     Comment:  a.) chronic No date: DJD (degenerative joint disease)     Comment:  a. Chronic R shoulder pain No date: Dyspnea 09/2009: Esophageal stenosis     Comment:  a.) transient outlet obstruction by food, cleared by EGD No date: Family history of adverse reaction to anesthesia     Comment:  a.) daughter with (+) PONV No date: Gastric bypass status for obesity No date: GERD (gastroesophageal reflux disease) No date: Headache(784.0) No date: HLD (hyperlipidemia) No date: Hypertension No  date: IBS (irritable bowel syndrome) No date: IDA (iron deficiency anemia)     Comment:  a.) post 2 unit txfsn 2009, normal endo/colonoscopy by               Servando Snare No date: Left bundle branch block (LBBB)     Comment:  a.) Intermittently present - likely rate related. 11/15/2020: NSVT (nonsustained ventricular tachycardia) (HCC)     Comment:  a.) Holter study 11/15/2020; 11 episodes of NSVT lasting              up to 8 beats with a maximum rate of 210 bpm; 14 atrial               runs lasting up to 18 beats with a rate of up to 250 bpm.              Some atrial runs felt to be NSVT. No date: NSVT (nonsustained ventricular tachycardia) (HCC)     Comment:  a. 10/2020 [facility]: 11 runs of NSVT up to 8 beats. No date: Obesity No date: OSA on CPAP 03/25/2013: Polyneuropathy in diabetes(357.2) 03/09/2021: Postherpetic neuralgia     Comment:  Right V1 distribution No date: PSVT (paroxysmal supraventricular tachycardia)     Comment:  a. 10/2020 [facility]: 14 atrial runs up to 18 beats, max HR               250. No date: Restless leg syndrome 08/13/2017: Rotator cuff arthropathy, right 03/12/2014: Syncope and collapse No date: Type II diabetes mellitus (HCC)  Past Surgical History: No date: APPENDECTOMY No date: BACK SURGERY 10/30/2022: CARDIOVERSION; N/A     Comment:  Procedure: CARDIOVERSION;  Surgeon: [name],               MD;  Location: [facility] ORS;  Service: Cardiovascular;                Laterality: N/A; 1976: CHOLECYSTECTOMY; N/A No date: COLONOSCOPY No date: CORONARY ANGIOPLASTY 01/04/2020: CORONARY STENT INTERVENTION; N/A     Comment:  Procedure: CORONARY STENT INTERVENTION;  Surgeon: Iran Ouch, MD;  Location: [facility] INVASIVE CV LAB;                Service: Cardiovascular;  Laterality: N/A;  LAD  2015: DIAPHRAGMATIC HERNIA REPAIR No date: ESOPHAGEAL DILATION     Comment:  multiple 05/11/2021: ESOPHAGOGASTRODUODENOSCOPY (EGD) WITH PROPOFOL; N/A     Comment:   Procedure: ESOPHAGOGASTRODUODENOSCOPY (EGD) WITH               PROPOFOL;  Surgeon: Servando Snare,  Darren, MD;  Location: [facility]               ENDOSCOPY;  Service: Endoscopy;  Laterality: N/A; 06/13/2021: ESOPHAGOGASTRODUODENOSCOPY (EGD) WITH PROPOFOL; N/A     Comment:  Procedure: ESOPHAGOGASTRODUODENOSCOPY (EGD) WITH               PROPOFOL;  Surgeon: [name], MD;  Location: [facility]               ENDOSCOPY;  Service: Endoscopy;  Laterality: N/A; No date: EYE SURGERY 2000, 2005: GASTRIC BYPASS     Comment:  Dr. Antony Madura 10/2011: Leane Platt IMPLANT PLACEMENT     Comment:  Achilles Dunk 09/18/2021: INTERSTIM IMPLANT PLACEMENT; N/A     Comment:  Procedure: Leane Platt IMPLANT FIRST STAGE;  Surgeon:               Alfredo Martinez, MD;  Location: [facility] ORS;  Service:               Urology;  Laterality: N/A; 09/18/2021: INTERSTIM IMPLANT PLACEMENT; N/A     Comment:  Procedure: INTERSTIM IMPLANT SECOND STAGE WITH IMPEDENCE              CHECK;  Surgeon: Alfredo Martinez, MD;  Location: [facility]               ORS;  Service: Urology;  Laterality: N/A; 09/18/2021: INTERSTIM IMPLANT REMOVAL; N/A     Comment:  Procedure: REMOVAL OF INTERSTIM IMPLANT;  Surgeon:               Alfredo Martinez, MD;  Location: [facility] ORS;  Service:               Urology;  Laterality: N/A; 04/1986: LEFT HEART CATH AND CORONARY ANGIOGRAPHY; N/A 07/24/2012: LEFT HEART CATH AND CORONARY ANGIOGRAPHY; Left 12/31/2019: LEFT HEART CATH AND CORS/GRAFTS ANGIOGRAPHY; N/A     Comment:  Procedure: LEFT HEART CATH AND CORS/GRAFTS ANGIOGRAPHY;               Surgeon: Antonieta Iba, MD;  Location: [facility] INVASIVE               CV LAB;  Service: Cardiovascular;  Laterality: N/A; 06/16/2019: PANNICULECTOMY; N/A     Comment:  Procedure: PANNICULECTOMY;  Surgeon: Allena Napoleon,               MD;  Location: [facility] OR;  Service: Plastics;  Laterality:               N/A;  3 hours, please 08/13/2017: REVERSE SHOULDER ARTHROPLASTY; Right     Comment:  Procedure:  REVERSE RIGHT SHOULDER ARTHROPLASTY;                Surgeon: Teryl Lucy, MD;  Location: [facility] OR;  Service:               Orthopedics;  Laterality: Right; 09/06/2020: RIGHT/LEFT HEART CATH AND CORONARY ANGIOGRAPHY; N/A     Comment:  Procedure: RIGHT/LEFT HEART CATH AND CORONARY               ANGIOGRAPHY;  Surgeon: [name], MD;  Location:               [facility] INVASIVE CV LAB;  Service: Cardiovascular;                Laterality: N/A; 06/04/2022: RIGHT/LEFT HEART CATH AND CORONARY ANGIOGRAPHY; N/A     Comment:  Procedure: RIGHT/LEFT HEART CATH AND CORONARY  ANGIOGRAPHY;  Surgeon: [name], MD;  [facility];  Service: Cardiovascular;                Laterality: N/A; No date: ROTATOR CUFF REPAIR; Right 1995: SPINE SURGERY     Comment:  [place] 11/13/2021: TOOTH EXTRACTION 1974: TOTAL ABDOMINAL HYSTERECTOMY W/ BILATERAL SALPINGOOPHORECTOMY 2007: TOTAL KNEE ARTHROPLASTY; Bilateral     Comment:  Procedure: TOTAL KNEE ARTHROPLASTY; Surgeons: Gerrit Heck, MD              and [name], MD 08/11/2015: UMBILICAL HERNIA REPAIR  BMI    Body Mass Index: 43.74 kg/m      Reproductive/Obstetrics negative OB ROS                             Anesthesia Physical Anesthesia Plan  ASA: 4  Anesthesia Plan: General   Post-op Pain Management: Minimal or no pain anticipated   Induction: Intravenous  PONV Risk Score and Plan: 2 and Propofol infusion and TIVA  Airway Management Planned: Natural Airway and Nasal Cannula  Additional Equipment:   Intra-op Plan:   Post-operative Plan:   Informed Consent: I have reviewed the patients History and Physical, chart, labs and discussed the procedure including the risks, benefits and alternatives for the proposed anesthesia with the patient or authorized representative who has indicated his/her understanding and acceptance.     Dental Advisory Given  Plan Discussed with:  Anesthesiologist, CRNA and Surgeon  Anesthesia Plan Comments: (Patient consented for risks of anesthesia including but not limited to:  - adverse reactions to medications - risk of airway placement if required - damage to eyes, teeth, lips or other oral mucosa - nerve damage due to positioning  - sore throat or hoarseness - Damage to heart, brain, nerves, lungs, other parts of body or loss of life  Patient voiced understanding.)        Anesthesia Quick Evaluation

## 2023-02-28 NOTE — Op Note (Signed)
[facility] Gastroenterology Patient Name: Denise Sharp Procedure Date: 02/28/2023 8:52 AM MRN: [id number] Account #: [id number] Date of Birth: 09/14/1945 Admit Type: Outpatient Age: 77 Room: [facility] ENDO ROOM 4 Gender: Female Note Status: Finalized Instrument Name: Upper Endoscope [id number] Procedure:             Upper GI endoscopy Indications:           Dysphagia Providers:             [name] MD, MD Referring MD:          Duncan Dull, MD (Referring MD) Medicines:             Propofol per Anesthesia Complications:         No immediate complications. Procedure:             Pre-Anesthesia Assessment:                        - Prior to the procedure, a History and Physical was                         performed, and patient medications and allergies were                         reviewed. The patient's tolerance of previous                         anesthesia was also reviewed. The risks and benefits                         of the procedure and the sedation options and risks                         were discussed with the patient. All questions were                         answered, and informed consent was obtained. Prior                         Anticoagulants: The patient has taken no anticoagulant                         or antiplatelet agents. ASA Grade Assessment: IV - A                         patient with severe systemic disease that is a                         constant threat to life. After reviewing the risks and                         benefits, the patient was deemed in satisfactory                         condition to undergo the procedure.                        After obtaining informed consent, the endoscope was  passed under direct vision. Throughout the procedure,                         the patient's blood pressure, pulse, and oxygen                         saturations were monitored continuously. The Endoscope                          was introduced through the mouth, and advanced to the                         jejunum. The upper GI endoscopy was accomplished                         without difficulty. The patient tolerated the                         procedure well. Findings:      One benign-appearing, intrinsic mild stenosis was found at the       gastroesophageal junction. The stenosis was traversed. A TTS dilator was       passed through the scope. Dilation with a 15-16.5-18 mm balloon dilator       was performed to 18 mm. The dilation site was examined following       endoscope reinsertion and showed complete resolution of luminal       narrowing.      Evidence of a gastric bypass was found. A gastric pouch was found.      The examined jejunum was normal. Impression:            - Benign-appearing esophageal stenosis. Dilated.                        - Gastric bypass.                        - Normal examined jejunum.                        - No specimens collected. Recommendation:        - Discharge patient to home.                        - Resume previous diet.                        - Continue present medications. Procedure Code(s):     --- Professional ---                        [phone], Esophagogastroduodenoscopy, flexible,                         transoral; with transendoscopic balloon dilation of                         esophagus (less than 30 mm diameter) Diagnosis Code(s):     --- Professional ---                        R13.10, Dysphagia, unspecified  K22.2, Esophageal obstruction CPT copyright 2022 [employer]. All rights reserved. The codes documented in this report are preliminary and upon coder review may  be revised to meet current compliance requirements. [name] MD, MD 02/28/2023 9:17:04 AM This report has been signed electronically. Number of Addenda: 0 Note Initiated On: 02/28/2023 8:52 AM Estimated Blood Loss:  Estimated blood loss: none.       [facility]

## 2023-02-28 NOTE — Anesthesia Postprocedure Evaluation (Signed)
Anesthesia Post Note  Patient: Denise Sharp  Procedure(s) Performed: ESOPHAGOGASTRODUODENOSCOPY (EGD) WITH PROPOFOL  Patient location during evaluation: Endoscopy Anesthesia Type: General Level of consciousness: awake and alert Pain management: pain level controlled Vital Signs Assessment: post-procedure vital signs reviewed and stable Respiratory status: spontaneous breathing, nonlabored ventilation, respiratory function stable and patient connected to nasal cannula oxygen Cardiovascular status: blood pressure returned to baseline and stable Postop Assessment: no apparent nausea or vomiting Anesthetic complications: no   No notable events documented.   Last Vitals:  Vitals:   02/28/23 0825 02/28/23 0917  BP: 110/70   Pulse: 71   Resp: 18 16  Temp: (!) 36.3 C   SpO2: 100% 100%    Last Pain:  Vitals:   02/28/23 0917  TempSrc:   PainSc: 0-No pain                 Louie Boston

## 2023-03-01 ENCOUNTER — Encounter: Payer: Self-pay | Admitting: Gastroenterology

## 2023-03-04 ENCOUNTER — Encounter: Payer: Self-pay | Admitting: Physician Assistant

## 2023-03-04 ENCOUNTER — Other Ambulatory Visit: Payer: Self-pay

## 2023-03-04 ENCOUNTER — Ambulatory Visit (INDEPENDENT_AMBULATORY_CARE_PROVIDER_SITE_OTHER): Payer: Medicare Other | Admitting: Physician Assistant

## 2023-03-04 VITALS — BP 125/63 | HR 79 | Temp 98.8°F | Ht 64.5 in | Wt 261.4 lb

## 2023-03-04 DIAGNOSIS — R1319 Other dysphagia: Secondary | ICD-10-CM

## 2023-03-04 DIAGNOSIS — D649 Anemia, unspecified: Secondary | ICD-10-CM

## 2023-03-04 DIAGNOSIS — K219 Gastro-esophageal reflux disease without esophagitis: Secondary | ICD-10-CM | POA: Diagnosis not present

## 2023-03-04 DIAGNOSIS — K529 Noninfective gastroenteritis and colitis, unspecified: Secondary | ICD-10-CM | POA: Diagnosis not present

## 2023-03-04 DIAGNOSIS — I5032 Chronic diastolic (congestive) heart failure: Secondary | ICD-10-CM

## 2023-03-04 DIAGNOSIS — R197 Diarrhea, unspecified: Secondary | ICD-10-CM

## 2023-03-04 DIAGNOSIS — K2100 Gastro-esophageal reflux disease with esophagitis, without bleeding: Secondary | ICD-10-CM

## 2023-03-04 DIAGNOSIS — K222 Esophageal obstruction: Secondary | ICD-10-CM

## 2023-03-04 LAB — BASIC METABOLIC PANEL
BUN/Creatinine Ratio: 10 — ABNORMAL LOW (ref 12–28)
BUN: 14 mg/dL (ref 8–27)
CO2: 18 mmol/L — ABNORMAL LOW (ref 20–29)
Calcium: 8.4 mg/dL — ABNORMAL LOW (ref 8.7–10.3)
Chloride: 104 mmol/L (ref 96–106)
Creatinine, Ser: 1.42 mg/dL — ABNORMAL HIGH (ref 0.57–1.00)
Glucose: 235 mg/dL — ABNORMAL HIGH (ref 70–99)
Potassium: 4.2 mmol/L (ref 3.5–5.2)
Sodium: 141 mmol/L (ref 134–144)
eGFR: 38 mL/min/{1.73_m2} — ABNORMAL LOW (ref >59)

## 2023-03-04 MED ORDER — PANTOPRAZOLE SODIUM 40 MG PO TBEC
40.0000 mg | DELAYED_RELEASE_TABLET | Freq: Every day | ORAL | 3 refills | Status: DC
Start: 2023-03-04 — End: 2024-04-07

## 2023-03-04 NOTE — Progress Notes (Signed)
Celso Amy, PA-C [address]  [address]  [place], [place] [address]  Main: [phone]  Fax: [phone]   Primary Care Physician: Sherlene Shams, MD  Primary Gastroenterologist:  Celso Amy, PA-C / Dr. [name]    CC: Follow-up dysphagia, GERD, and esophageal stricture  HPI: Denise Sharp is a 77 y.o. female returns for 6 week f/u GERD, dysphagia, history of of esophageal stricture.  Currently taking pantoprazole 40 Mg to once daily for acid reflux.  Sucralfate 1 g 3 times daily as needed.  She has not had any more episodes of dysphagia since her EGD a few days ago.  She ran out of pantoprazole and needs refill of medication.  She has noticed voice hoarseness and sore throat since she ran out of pantoprazole.  Medication is filled through [facility].  EGD done by Dr. Servando Snare 02/28/23 showed 1 benign-appearing intrinsic mild stenosis at the GE junction dilated to 18 mm.  Evidence of gastric bypass.  Normal jejunum.  No biopsies.  Abdominal pelvic CT 12/14/2022 showed no acute abnormality.  There was esophageal thickening possibly related to GERD and esophagitis.   EGD by Dr. Servando Snare 05/2021 showed mild distal esophageal stricture dilated to 18 mm.   Last colonoscopy 12/2013 was normal.  10-year repeat screening.  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 Extensive GI evaluation in the past. History of iron deficiency anemia, gastric bypass x 2, IBS, Chronic Diarrhea, esophageal stenosis.  Takes Bentyl and Imodium as needed.  Patient states that she is extremely frustrated about persistent diarrhea.  She has episodes of fecal incontinence.  Has had diarrhea since her 7s.  Patient is considering having a colectomy.  No recent stool studies.  No melena, hematochezia, or weight loss.  Current Outpatient Medications  Medication Sig Dispense Refill   albuterol (VENTOLIN HFA) 108 (90 Base) MCG/ACT inhaler Inhale  1-2 puffs into the lungs every 6 (six) hours as needed for wheezing or shortness of breath. 18 g 2   apixaban (ELIQUIS) 5 MG TABS tablet Take 1 tablet (5 mg total) by mouth 2 (two) times daily. 180 tablet 1   atorvastatin (LIPITOR) 80 MG tablet Take 1 tablet (80 mg total) by mouth daily. 90 tablet 1   benzonatate (TESSALON) 200 MG capsule Take 1 capsule (200 mg total) by mouth 2 (two) times daily as needed for cough. 20 capsule 0   butalbital-acetaminophen-caffeine (FIORICET) 50-325-40 MG tablet TAKE 1 TABLET BY MOUTH EVERY 6 HOURS AS NEEDED FOR HEADACHE. 60 tablet 2   Cholecalciferol (VITAMIN D3) 250 MCG (10000 UT) capsule Take 10,000 Units by mouth daily.     clotrimazole-betamethasone (LOTRISONE) cream Apply 1 application topically 2 (two) times daily as needed (irritation).     conjugated estrogens (PREMARIN) vaginal cream Apply one pea-sized amount around the opening of the urethra daily for 2 weeks, then 3 times weekly moving forward. 30 g 4   Continuous Blood Gluc Receiver (FREESTYLE LIBRE 2 READER) DEVI Use to check sugar at least TID 1 each 1   cyanocobalamin (VITAMIN B12) 1000 MCG/ML injection Inject 1 mL (1,000 mcg total) into the muscle every 30 (thirty) days. Inject 1 ml (1000 mcg ) IM weekly x 4,  Then monthly thereafter 3 mL 1   dapagliflozin propanediol (FARXIGA) 10 MG TABS tablet Take 1 tablet (10 mg total) by mouth daily. 90 tablet 3   dicyclomine (BENTYL) 20 MG tablet Take 1 tablet (  20 mg total) by mouth 3 (three) times daily before meals. 180 tablet 2   DULoxetine (CYMBALTA) 30 MG capsule Take 1 capsule (30 mg total) by mouth daily. 90 capsule 4   ezetimibe (ZETIA) 10 MG tablet Take 1 tablet (10 mg total) by mouth daily. 90 tablet 3   fexofenadine (ALLEGRA) 180 MG tablet Take 180 mg by mouth as needed for allergies or rhinitis.     fluticasone (FLONASE) 50 MCG/ACT nasal spray Place 2 sprays into both nostrils daily. (Patient taking differently: Place 2 sprays into both nostrils 3  (three) times daily as needed for allergies.) 16 g 6   furosemide (LASIX) 20 MG tablet Take 1 tablet (20 mg total) by mouth daily. 90 tablet 3   Glucagon (GVOKE HYPOPEN 2-PACK) 0.5 MG/0.1ML SOAJ Inject 1 auto injector subcutaneously as needed for severe hypoglycemia. May repeat x 1 after 15 minutes if needed. 0.1 mL 2   glucose blood (ONETOUCH VERIO) test strip Use to check blood sugar twice daily. 100 each 1   insulin degludec (TRESIBA FLEXTOUCH) 100 UNIT/ML FlexTouch Pen Inject 12 Units into the skin daily. 15 mL 3   Insulin lispro (HUMALOG JUNIOR KWIKPEN) 100 UNIT/ML 10-20 units qac usign sliding scale 18 mL 2   Insulin Pen Needle (DROPLET PEN NEEDLES) 31G X 5 MM MISC USE ONE NEEDLE SUBCUTANEOUSLY AS DIRECTED (REMOVE AND DISCARD NEEDLE IN SHARPS CONTAINER IMMEDIATELY AFTER USE) 100 each 1   loperamide (IMODIUM) 2 MG capsule Take 4-6 mg by mouth 3 (three) times daily before meals.     magnesium gluconate (MAGONATE) 500 MG tablet Take 500 mg by mouth daily.     metoprolol succinate (TOPROL-XL) 25 MG 24 hr tablet Take 0.5 tablets (12.5 mg total) by mouth daily. Take with or immediately following a meal. 45 tablet 3   nitroGLYCERIN (NITROSTAT) 0.4 MG SL tablet Place 1 tablet (0.4 mg total) under the tongue every 5 (five) minutes as needed for chest pain. 25 tablet 0   nystatin (MYCOSTATIN/NYSTOP) powder Apply 1 g topically 4 (four) times daily as needed (rash).     ondansetron (ZOFRAN-ODT) 4 MG disintegrating tablet Take 1 tablet (4 mg total) by mouth every 8 (eight) hours as needed for nausea or vomiting. 30 tablet 0   OneTouch Delica Lancets 33G MISC Used to check blood sugar two times a day. 100 each 4   OVER THE COUNTER MEDICATION Apply 1 application  topically daily as needed (pain). [place] Gel topical pain reliever     Polyethyl Glycol-Propyl Glycol (SYSTANE OP) Place 1 drop into both eyes 4 (four) times daily as needed (dry eyes).     pregabalin (LYRICA) 50 MG capsule Take 1 capsule (50 mg  total) by mouth 2 (two) times daily. 180 capsule 1   sucralfate (CARAFATE) 1 g tablet Take 1 tablet (1 g total) by mouth 3 (three) times daily before meals. 270 tablet 3   SYRINGE-NEEDLE, DISP, 3 ML (LUER LOCK SAFETY SYRINGES) 25G X 1" 3 ML MISC Use to give b12 injections once monthly 12 each 0   pantoprazole (PROTONIX) 40 MG tablet Take 1 tablet (40 mg total) by mouth daily. 90 tablet 3   No current facility-administered medications for this visit.    Allergies as of 03/04/2023 - Review Complete 03/04/2023  Allergen Reaction Noted   Demeclocycline Hives 09/18/2013   Erythromycin Nausea And Vomiting and Other (See Comments) 08/17/2011   Flagyl [metronidazole] Nausea And Vomiting and Other (See Comments) 08/17/2011   Glucophage [metformin hcl]  Nausea And Vomiting and Other (See Comments) 08/17/2011   Tetracyclines & related Hives and Rash 03/12/2011   Furosemide Swelling and Other (See Comments) 03/12/2011   Tetracycline Other (See Comments) 09/27/2022   Diovan [valsartan] Nausea Only 08/17/2011   Sulfa antibiotics Other (See Comments) and Rash 09/18/2013   Xanax [alprazolam] Other (See Comments) 08/17/2011    Past Medical History:  Diagnosis Date   (HFpEF) heart failure with preserved ejection fraction (HCC)    a. 12/2019 Echo: EF 65-70%, Gr2 DD. No significant valvular dzs.   Abnormality of gait 03/25/2013   Adrenal mass, left (HCC)    Anginal pain (HCC)    Anxiety    Aortic atherosclerosis (HCC)    Arthritis    Asthma    CAD (coronary artery disease) with h/o Atypical Chest Pain    a. 04/1986 Cath ([facility]): nl cors, EF 65%; b. 07/2012 Cath [facility]): Diff minor irregs; c. 07/2016 MV [facility]): "Equivocal"; d. 08/2016 Cardiac CT Ca2+ score [facility]): Ca2+ 1548; e. 05/2019 MV: No ischemia. EF 75%; f. 12/2019 PCI: LM nl, LAD 65ost (3.5x12 Resolute DES), 29m (2.75x12 Resolute DES),LCX/RCA nl; g. 08/2020 Cath: patent LAD stents, RCA 40ost, elev RH pressures, EF >65%.   Cervical spinal stenosis 1994    due to trauma to back ([employer] accident), has intermittent paralysis and parasthesias   Cervicogenic headache 03/23/2014   CKD (chronic kidney disease), stage III (HCC)    Depression    Diverticulosis    Dizziness    a.) chronic   DJD (degenerative joint disease)    a. Chronic R shoulder pain   Dyspnea    Esophageal stenosis 09/2009   a.) transient outlet obstruction by food, cleared by EGD   Family history of adverse reaction to anesthesia    a.) daughter with (+) PONV   Gastric bypass status for obesity    GERD (gastroesophageal reflux disease)    Headache(784.0)    HLD (hyperlipidemia)    Hypertension    IBS (irritable bowel syndrome)    IDA (iron deficiency anemia)    a.) post 2 unit txfsn 2009, normal endo/colonoscopy by [facility]   Left bundle branch block (LBBB)    a.) Intermittently present - likely rate related.   NSVT (nonsustained ventricular tachycardia) (HCC) 11/15/2020   a.) Holter study 11/15/2020; 11 episodes of NSVT lasting up to 8 beats with a maximum rate of 210 bpm; 14 atrial runs lasting up to 18 beats with a rate of up to 250 bpm.  Some atrial runs felt to be NSVT.   NSVT (nonsustained ventricular tachycardia) (HCC)    a. 10/2020 [facility]: 11 runs of NSVT up to 8 beats.   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 10/2020 [facility]: 14 atrial runs up to 18 beats, max HR 250.   Restless leg syndrome    Rotator cuff arthropathy, right 08/13/2017   Syncope and collapse 03/12/2014   Type II diabetes mellitus (HCC)     Past Surgical History:  Procedure Laterality Date   APPENDECTOMY     BACK SURGERY     CARDIOVERSION N/A 10/30/2022   Procedure: CARDIOVERSION;  Surgeon: [name], MD;  Location: [facility] ORS;  Service: Cardiovascular;  Laterality: N/A;   CHOLECYSTECTOMY N/A 1976   COLONOSCOPY     CORONARY ANGIOPLASTY      CORONARY STENT INTERVENTION N/A 01/04/2020  Procedure: CORONARY STENT INTERVENTION;  Surgeon: Iran Ouch, MD;  Location: [facility] INVASIVE CV LAB;  Service: Cardiovascular;  Laterality: N/A;  LAD    DIAPHRAGMATIC HERNIA REPAIR  2015   ESOPHAGEAL DILATION     multiple   ESOPHAGOGASTRODUODENOSCOPY (EGD) WITH PROPOFOL N/A 05/11/2021   Procedure: ESOPHAGOGASTRODUODENOSCOPY (EGD) WITH PROPOFOL;  Surgeon: [name], MD;  Location: [facility] ENDOSCOPY;  Service: Endoscopy;  Laterality: N/A;   ESOPHAGOGASTRODUODENOSCOPY (EGD) WITH PROPOFOL N/A 06/13/2021   Procedure: ESOPHAGOGASTRODUODENOSCOPY (EGD) WITH PROPOFOL;  Surgeon: [name], MD;  Location: [facility];  Service: Endoscopy;  Laterality: N/A;   ESOPHAGOGASTRODUODENOSCOPY (EGD) WITH PROPOFOL N/A 02/28/2023   Procedure: ESOPHAGOGASTRODUODENOSCOPY (EGD) WITH PROPOFOL;  Surgeon: [name], MD;  Location: [facility] ENDOSCOPY;  Service: Endoscopy;  Laterality: N/A;   EYE SURGERY     GASTRIC BYPASS  2000, 2005   Dr. Arlana Pouch IMPLANT PLACEMENT  10/2011   Jeannette How IMPLANT PLACEMENT N/A 09/18/2021   Procedure: Leane Platt IMPLANT FIRST STAGE;  Surgeon: Alfredo Martinez, MD;  Location: [facility];  Service: Urology;  Laterality: N/A;   INTERSTIM IMPLANT PLACEMENT N/A 09/18/2021   Procedure: Leane Platt IMPLANT SECOND STAGE WITH IMPEDENCE CHECK;  Surgeon: Alfredo Martinez, MD;  Location: [facility];  Service: Urology;  Laterality: N/A;   INTERSTIM IMPLANT REMOVAL N/A 09/18/2021   Procedure: REMOVAL OF INTERSTIM IMPLANT;  Surgeon: Alfredo Martinez, MD;  Location: [facility];  Service: Urology;  Laterality: N/A;   LEFT HEART CATH AND CORONARY ANGIOGRAPHY N/A 04/1986   LEFT HEART CATH AND CORONARY ANGIOGRAPHY Left 07/24/2012   LEFT HEART CATH AND CORS/GRAFTS ANGIOGRAPHY N/A 12/31/2019   Procedure: LEFT HEART CATH AND CORS/GRAFTS ANGIOGRAPHY;  Surgeon: Antonieta Iba, MD;  Location: [facility];  Service: Cardiovascular;   Laterality: N/A;   PANNICULECTOMY N/A 06/16/2019   Procedure: PANNICULECTOMY;  Surgeon: Allena Napoleon, MD;  Location: [facility] OR;  Service: Plastics;  Laterality: N/A;  3 hours, please   REVERSE SHOULDER ARTHROPLASTY Right 08/13/2017   Procedure: REVERSE RIGHT SHOULDER ARTHROPLASTY;  Surgeon: Teryl Lucy, MD;  Location: [facility] OR;  Service: Orthopedics;  Laterality: Right;   RIGHT/LEFT HEART CATH AND CORONARY ANGIOGRAPHY N/A 09/06/2020   Procedure: RIGHT/LEFT HEART CATH AND CORONARY ANGIOGRAPHY;  Surgeon: [name], MD;  Location: [facility];  Service: Cardiovascular;  Laterality: N/A;   RIGHT/LEFT HEART CATH AND CORONARY ANGIOGRAPHY N/A 06/04/2022   Procedure: RIGHT/LEFT HEART CATH AND CORONARY ANGIOGRAPHY;  Surgeon: [name], MD;  Location: [facility];  Service: Cardiovascular;  Laterality: N/A;   ROTATOR CUFF REPAIR Right    SPINE SURGERY  1995   [place]   TOOTH EXTRACTION  11/13/2021   TOTAL ABDOMINAL HYSTERECTOMY W/ BILATERAL SALPINGOOPHORECTOMY  1974   TOTAL KNEE ARTHROPLASTY Bilateral 2007   Procedure: TOTAL KNEE ARTHROPLASTY; Surgeons: Gerrit Heck, MD and [name], MD   UMBILICAL HERNIA REPAIR  08/11/2015    Review of Systems:    All systems reviewed and negative except where noted in HPI.   Physical Examination:   BP 125/63   Pulse 79   Temp 98.8 F (37.1 C)   Ht 5' 4.5" (1.638 m)   Wt 261 lb 6.4 oz (118.6 kg)   BMI 44.18 kg/m   General: Well-nourished, obese female, chronically ill-appearing, well-developed in no acute distress.  Neuro: Alert and oriented x 3.  Grossly intact. Walks with a Environmental consultant. Psych: Alert and cooperative, normal mood and affect. No Exam performed.  Imaging Studies: See HPI.  Assessment and Plan:  Denise Sharp is a 77 y.o. y/o female returns for follow-up of dysphagia, GERD, distal esophageal stricture, and Chronic Diarrhea.  Dysphagia has improved post EGD with dilated to 18 mm 02/28/2023 by Dr. Servando Snare.  She is still  having Chronic Diarrhea.  1.  Distal esophageal stricture  Dysphagia has Improved post recent EGD with esophageal dilation.  Continue to chew food well, take small bites, eat slowly.  2.  Chronic Diarrhea  Celiac Lab  Stool Studies: GI Pathogen Panal, C. Difficile Toxin PCR, Fecal Calprotectin, Fecal Elastase  Consider Creon or ZenPep Pancreas Enzymes if Fecal Elastase is Low  If above tests are normal, then try Cholestyramine powder, Colestid Tablets, Xifaxan, or Lomotil  If Diarrhea persists, then repeat Colonoscopy to check for Microscopic Colitis  If Colonoscopy unrevealing and diarrhea still persists, consider referral to tertiary care center to discuss colectomy per patient request.  3.  GERD  Continue pantoprazole 40 Mg once daily and sucralfate 3 times daily as needed.  4.  Colon cancer screening  10-year repeat screening colonoscopy will be due 12/2023.   Celso Amy, PA-C  Follow up with TG in 6 weeks for Diarrhea.

## 2023-03-05 ENCOUNTER — Other Ambulatory Visit: Payer: Self-pay | Admitting: Medical

## 2023-03-05 DIAGNOSIS — I5032 Chronic diastolic (congestive) heart failure: Secondary | ICD-10-CM | POA: Diagnosis not present

## 2023-03-05 DIAGNOSIS — K529 Noninfective gastroenteritis and colitis, unspecified: Secondary | ICD-10-CM | POA: Diagnosis not present

## 2023-03-06 LAB — BASIC METABOLIC PANEL
BUN/Creatinine Ratio: 11 — ABNORMAL LOW (ref 12–28)
BUN: 13 mg/dL (ref 8–27)
CO2: 21 mmol/L (ref 20–29)
Calcium: 8.7 mg/dL (ref 8.7–10.3)
Chloride: 104 mmol/L (ref 96–106)
Creatinine, Ser: 1.22 mg/dL — ABNORMAL HIGH (ref 0.57–1.00)
Glucose: 156 mg/dL — ABNORMAL HIGH (ref 70–99)
Potassium: 4.4 mmol/L (ref 3.5–5.2)
Sodium: 142 mmol/L (ref 134–144)
eGFR: 46 mL/min/{1.73_m2} — ABNORMAL LOW (ref >59)

## 2023-03-06 LAB — CLOSTRIDIUM DIFFICILE BY PCR

## 2023-03-07 ENCOUNTER — Encounter: Payer: Self-pay | Admitting: Pulmonary Disease

## 2023-03-07 ENCOUNTER — Ambulatory Visit (INDEPENDENT_AMBULATORY_CARE_PROVIDER_SITE_OTHER): Payer: Medicare Other | Admitting: Pulmonary Disease

## 2023-03-07 VITALS — BP 128/78 | HR 71 | Temp 98.0°F | Ht 64.5 in | Wt 259.4 lb

## 2023-03-07 DIAGNOSIS — G4733 Obstructive sleep apnea (adult) (pediatric): Secondary | ICD-10-CM

## 2023-03-07 DIAGNOSIS — R0602 Shortness of breath: Secondary | ICD-10-CM | POA: Diagnosis not present

## 2023-03-07 LAB — NITRIC OXIDE: Nitric Oxide: 9

## 2023-03-07 MED ORDER — BUDESONIDE-FORMOTEROL FUMARATE 160-4.5 MCG/ACT IN AERO: 2.0000 | INHALATION_SPRAY | Freq: Two times a day (BID) | RESPIRATORY_TRACT | 6 refills | Status: DC

## 2023-03-07 NOTE — Patient Instructions (Signed)
Symbicort two puffs in the morning and two puffs in the evening, and rinse your mouth after each use.  Will arrange for a new auto CPAP machine.  Follow up in 2 months.

## 2023-03-07 NOTE — Progress Notes (Signed)
[facility]  Chief Complaint  Patient presents with   Follow-up    Wearing cpap nightly.- pressure and mask is  SOB with exertion and at rest and wheezing.    Past Surgical History:  She  has a past surgical history that includes Gastric bypass (2000, 2005); Appendectomy; Rotator cuff repair (Right); Total knee arthroplasty (Bilateral, 2007); Spine surgery (1995); Interstim Implant placement (10/2011); Total abdominal hysterectomy w/ bilateral salpingoophorectomy (1974); Umbilical hernia repair (08/11/2015); Diaphragmatic hernia repair (2015); Colonoscopy; Esophageal dilation; Reverse shoulder arthroplasty (Right, 08/13/2017); Panniculectomy (N/A, 06/16/2019); LEFT HEART CATH AND CORS/GRAFTS ANGIOGRAPHY (N/A, 12/31/2019); CORONARY STENT INTERVENTION (N/A, 01/04/2020); LEFT HEART CATH AND CORONARY ANGIOGRAPHY (N/A, 04/1986); RIGHT/LEFT HEART CATH AND CORONARY ANGIOGRAPHY (N/A, 09/06/2020); Esophagogastroduodenoscopy (egd) with propofol (N/A, 05/11/2021); Coronary angioplasty; Back surgery; Esophagogastroduodenoscopy (egd) with propofol (N/A, 06/13/2021); Cholecystectomy (N/A, 1976); Eye surgery; LEFT HEART CATH AND CORONARY ANGIOGRAPHY (Left, 07/24/2012); Interstim Implant removal (N/A, 09/18/2021); Interstim Implant placement (N/A, 09/18/2021); Interstim Implant placement (N/A, 09/18/2021); Tooth extraction (11/13/2021); RIGHT/LEFT HEART CATH AND CORONARY ANGIOGRAPHY (N/A, 06/04/2022); Cardioversion (N/A, 10/30/2022); and Esophagogastroduodenoscopy (egd) with propofol (N/A, 02/28/2023).  Past Medical History:  HFpEF, Anxiety, OA, CAD, Cervical spinal stenosis, Headache, CKD 3, Depression, Diverticulosis, Dizziness with unsteady gait, Esophageal stenosis, s/p Gastric bypass, GERD, HLD, HTN, IBS, IDA, LBBB, NSVT, DM type 2, Neuropathy, Postherpetic neuralgia  Constitutional:  BP 128/78 (BP Location: Left Arm, Cuff Size: Large)   Pulse 71   Temp 98 F (36.7  C) (Temporal)   Ht 5' 4.5" (1.638 m)   Wt 259 lb 6.4 oz (117.7 kg)   SpO2 95%   BMI 43.84 kg/m   Brief Summary:  Denise Sharp is a 77 y.o. female with obstructive sleep apnea.      Subjective:   She is here with her husband.  Uses CPAP nightly.  No issues with mask fit or pressure.  She gets winded with minimal activity.  Takes about 30 minutes to recover.  Has dry cough.  Tries tessalon - helps sometimes.  Hasn't used her albuterol recently.  Physical Exam:   Appearance - well kempt   ENMT - no sinus tenderness, no oral exudate, no LAN, Mallampati 3 airway, no stridor  Respiratory - equal breath sounds bilaterally, no wheezing or rales  CV - s1s2 regular rate and rhythm, no murmurs  Ext - no clubbing, no edema  Skin - no rashes  Psych - normal mood and affect   Pulmonary testing:  FeNO 03/07/23 >> 9  Chest Imaging:  CT angio chest 12/14/22 >> trace effusions, diffuse bronchial thickening, increased mosaic attentuation, LUL ATX. 1 cm GGO LUL stable since 2019  Sleep Tests:  PSG 08/14/12 >> AHI 13.2, SpO2 low 85% Auto CPAP 02/03/23 to 03/04/23 >> used on 29 of 30 nights with average 7 hrs 4 min.  Average AHI 0.9 with median CPAP 7 and 95 th percentile CPAP 10 cm H2O  Cardiac Tests:  Echo 12/30/19 >> EF 65 to 70%, grade 2 DD  Social History:  She  reports that she has never smoked. She has never been exposed to tobacco smoke. She has never used smokeless tobacco. She reports that she does not currently use drugs. She reports that she does not drink alcohol.  Family History:  Her family history includes Depression in her mother; Diabetes in her daughter; Headache in her mother; Heart disease in her daughter, father, and mother; Hypertension in her daughter, father, mother, and son; Osteoporosis in her father;  Prostate cancer in her father; Stroke in her father and mother; Thyroid disease in her mother.     Assessment/Plan:   Obstructive sleep apnea. - she is  compliant with CPAP and reports benefit from therapy - she uses [employer] for her DME - her current CPAP is more than 77 years old - will arrange for a new Resmed 11 auto CPAP 5 to 15 cm H2O  Dyspnea on exertion. - likely from diastolic CHF and deconditioning - recent CT chest showed changes suggestive of asthma - will have her try symbicort 160 two puffs bid - discussed importance of maintaining a regular exercise regimen as tolerated  Persistent atrial fibrillation. - followed by Dr. Cristal Deer End with cardiology   Time Spent Involved in Patient Care on Day of Examination:  38 minutes  Follow up:   There are no Patient Instructions on file for this visit.  Medication List:   Allergies as of 03/07/2023       Reactions   Demeclocycline Hives   Erythromycin Nausea And Vomiting, Other (See Comments)   Severe irritable bowel   Flagyl [metronidazole] Nausea And Vomiting, Other (See Comments)   Severe irritable bowel   Glucophage [metformin Hcl] Nausea And Vomiting, Other (See Comments)   "Sick" "I won't take anything that has metformin in it"   Tetracyclines & Related Hives, Rash   Furosemide Swelling, Other (See Comments)   Other reaction(s): Localized superficial swelling of skin Has opposite effect   Tetracycline Other (See Comments)   Diovan [valsartan] Nausea Only       Sulfa Antibiotics Other (See Comments), Rash   As child   Xanax [alprazolam] Other (See Comments)   Hyperactivity         Medication List        Accurate as of March 07, 2023  3:47 PM. If you have any questions, ask your nurse or doctor.          albuterol 108 (90 Base) MCG/ACT inhaler Commonly known as: VENTOLIN HFA Inhale 1-2 puffs into the lungs every 6 (six) hours as needed for wheezing or shortness of breath.   apixaban 5 MG Tabs tablet Commonly known as: ELIQUIS Take 1 tablet (5 mg total) by mouth 2 (two) times daily.   atorvastatin 80 MG tablet Commonly known as: LIPITOR Take 1  tablet (80 mg total) by mouth daily.   benzonatate 200 MG capsule Commonly known as: TESSALON Take 1 capsule (200 mg total) by mouth 2 (two) times daily as needed for cough.   butalbital-acetaminophen-caffeine 50-325-40 MG tablet Commonly known as: FIORICET TAKE 1 TABLET BY MOUTH EVERY 6 HOURS AS NEEDED FOR HEADACHE.   clotrimazole-betamethasone cream Commonly known as: LOTRISONE Apply 1 application topically 2 (two) times daily as needed (irritation).   cyanocobalamin 1000 MCG/ML injection Commonly known as: VITAMIN B12 Inject 1 mL (1,000 mcg total) into the muscle every 30 (thirty) days. Inject 1 ml (1000 mcg ) IM weekly x 4,  Then monthly thereafter   dapagliflozin propanediol 10 MG Tabs tablet Commonly known as: FARXIGA Take 1 tablet (10 mg total) by mouth daily.   dicyclomine 20 MG tablet Commonly known as: BENTYL Take 1 tablet (20 mg total) by mouth 3 (three) times daily before meals.   Droplet Pen Needles 31G X 5 MM Misc Generic drug: Insulin Pen Needle USE ONE NEEDLE SUBCUTANEOUSLY AS DIRECTED (REMOVE AND DISCARD NEEDLE IN SHARPS CONTAINER IMMEDIATELY AFTER USE)   DULoxetine 30 MG capsule Commonly known as: Cymbalta Take 1 capsule (  30 mg total) by mouth daily.   ezetimibe 10 MG tablet Commonly known as: ZETIA Take 1 tablet (10 mg total) by mouth daily.   fexofenadine 180 MG tablet Commonly known as: ALLEGRA Take 180 mg by mouth as needed for allergies or rhinitis.   fluticasone 50 MCG/ACT nasal spray Commonly known as: FLONASE Place 2 sprays into both nostrils daily. What changed:  when to take this reasons to take this   FreeStyle Libre 2 Reader Hardie Pulley Use to check sugar at least TID   furosemide 20 MG tablet Commonly known as: LASIX Take 1 tablet (20 mg total) by mouth daily.   Gvoke HypoPen 2-Pack 0.5 MG/0.1ML Soaj Generic drug: Glucagon Inject 1 auto injector subcutaneously as needed for severe hypoglycemia. May repeat x 1 after 15 minutes if  needed.   HumaLOG Junior KwikPen 100 UNIT/ML KwikPen Junior Generic drug: Insulin lispro 10-20 units qac usign sliding scale   loperamide 2 MG capsule Commonly known as: IMODIUM Take 4-6 mg by mouth 3 (three) times daily before meals.   Luer Lock Safety Syringes 25G X 1" 3 ML Misc Generic drug: SYRINGE-NEEDLE (DISP) 3 ML Use to give b12 injections once monthly   magnesium gluconate 500 MG tablet Commonly known as: MAGONATE Take 500 mg by mouth daily.   metoprolol succinate 25 MG 24 hr tablet Commonly known as: TOPROL-XL Take 0.5 tablets (12.5 mg total) by mouth daily. Take with or immediately following a meal.   nitroGLYCERIN 0.4 MG SL tablet Commonly known as: NITROSTAT Place 1 tablet (0.4 mg total) under the tongue every 5 (five) minutes as needed for chest pain.   nystatin powder Commonly known as: MYCOSTATIN/NYSTOP Apply 1 g topically 4 (four) times daily as needed (rash).   ondansetron 4 MG disintegrating tablet Commonly known as: ZOFRAN-ODT Take 1 tablet (4 mg total) by mouth every 8 (eight) hours as needed for nausea or vomiting.   OneTouch Delica Lancets 33G Misc Used to check blood sugar two times a day.   OneTouch Verio test strip Generic drug: glucose blood Use to check blood sugar twice daily.   OVER THE COUNTER MEDICATION Apply 1 application  topically daily as needed (pain). [place] Gel topical pain reliever   pantoprazole 40 MG tablet Commonly known as: PROTONIX Take 1 tablet (40 mg total) by mouth daily.   pregabalin 50 MG capsule Commonly known as: LYRICA Take 1 capsule (50 mg total) by mouth 2 (two) times daily.   Premarin vaginal cream Generic drug: conjugated estrogens Apply one pea-sized amount around the opening of the urethra daily for 2 weeks, then 3 times weekly moving forward.   sucralfate 1 g tablet Commonly known as: Carafate Take 1 tablet (1 g total) by mouth 3 (three) times daily before meals.   SYSTANE OP Place 1 drop into both  eyes 4 (four) times daily as needed (dry eyes).   Evaristo Bury FlexTouch 100 UNIT/ML FlexTouch Pen Generic drug: insulin degludec Inject 12 Units into the skin daily.   Vitamin D3 250 MCG (10000 UT) capsule Take 10,000 Units by mouth daily.        Signature:  Coralyn Helling, MD [facility] Pulmonary/Critical Care Pager - [phone] 03/07/2023, 3:47 PM

## 2023-03-08 ENCOUNTER — Telehealth: Payer: Self-pay

## 2023-03-08 DIAGNOSIS — K8689 Other specified diseases of pancreas: Secondary | ICD-10-CM

## 2023-03-08 LAB — GI PROFILE, STOOL, PCR
Adenovirus F 40/41: NOT DETECTED
Astrovirus: NOT DETECTED
C difficile toxin A/B: NOT DETECTED
Campylobacter: NOT DETECTED
Cryptosporidium: NOT DETECTED
Cyclospora cayetanensis: NOT DETECTED
Entamoeba histolytica: NOT DETECTED
Enteroaggregative E coli: NOT DETECTED
Enteropathogenic E coli: DETECTED — AB
Enterotoxigenic E coli: NOT DETECTED
Giardia lamblia: NOT DETECTED
Norovirus GI/GII: DETECTED — AB
Plesiomonas shigelloides: NOT DETECTED
Rotavirus A: NOT DETECTED
Salmonella: NOT DETECTED
Sapovirus: NOT DETECTED
Shiga-toxin-producing E coli: NOT DETECTED
Shigella/Enteroinvasive E coli: NOT DETECTED
Vibrio cholerae: NOT DETECTED
Vibrio: NOT DETECTED
Yersinia enterocolitica: NOT DETECTED

## 2023-03-08 LAB — CELIAC DISEASE AB SCREEN W/RFX
Antigliadin Abs, IgA: 5 U (ref 0–19)
IgA/Immunoglobulin A, Serum: 404 mg/dL (ref 64–422)
Transglutaminase IgA: <2 U/mL (ref 0–3)

## 2023-03-08 LAB — CALPROTECTIN, FECAL

## 2023-03-08 LAB — PANCREATIC ELASTASE, FECAL: Pancreatic Elastase, Fecal: 143 ug Elast./g — ABNORMAL LOW (ref >200)

## 2023-03-08 MED ORDER — CIPROFLOXACIN HCL 500 MG PO TABS: 500.0000 mg | ORAL_TABLET | Freq: Every day | ORAL | 0 refills | Status: AC

## 2023-03-08 MED ORDER — PANCRELIPASE (LIP-PROT-AMYL) 36000-114000 UNITS PO CPEP: ORAL_CAPSULE | ORAL | 9 refills | Status: AC

## 2023-03-08 NOTE — Progress Notes (Signed)
Call and notify patient: 1.  Celiac labs are negative.  No evidence of celiac disease or gluten allergy. 2.  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  Start Creon 32,000 lipase units, take 2 capsules with each meal and 1 with each snack.  Okay to give samples and prescription. 3.  Stool test is positive for enteropathogenic E. coli and norovirus.  Negative for all other infections.  I recommend treat with ciprofloxacin 500 mg 1 tablet once daily for 3 days, #3, no refills. Celso Amy, PA-C

## 2023-03-08 NOTE — Telephone Encounter (Signed)
Left vm on cell phone and home machine for pt to return my call. Also sent patient a Mychart message as well.   Call and notify patient: 1.  Celiac labs are negative.  No evidence of celiac disease or gluten allergy. 2.  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  Start Creon 32,000 lipase units, take 2 capsules with each meal and 1 with each snack.  Okay to give samples and prescription. 3.  Stool test is positive for enteropathogenic E. coli and norovirus.  Negative for all other infections.  I recommend treat with ciprofloxacin 500 mg 1 tablet once daily for 3 days, #3, no refills.  Celso Amy, PA-C

## 2023-03-08 NOTE — Telephone Encounter (Signed)
    Call and notify patient:  1.  Celiac labs are negative.  No evidence of celiac disease or gluten allergy.  2.  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  Start Creon 32,000 lipase units, take 2 capsules with each meal and 1 with each snack.  Okay to give samples and prescription.  3.  Stool test is positive for enteropathogenic E. coli and norovirus.  Negative for all other infections.  I recommend treat with ciprofloxacin 500 mg 1 tablet once daily for 3 days, #3, no refills.  Celso Amy, PA-C

## 2023-03-08 NOTE — Telephone Encounter (Signed)
-----   Message from Celso Amy sent at 03/08/2023  8:05 AM EDT ----- Call and notify patient: 1.  Celiac labs are negative.  No evidence of celiac disease or gluten allergy. 2.  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  Start Creon 32,000 lipase units, take 2 capsules with each meal and 1 with each snack.  Okay to give samples and prescription. 3.  Stool test is positive for enteropathogenic E. coli and norovirus.  Negative for all other infections.  I recommend treat with ciprofloxacin 500 mg 1 tablet once daily for 3 days, #3, no refills. Celso Amy, PA-C

## 2023-03-13 ENCOUNTER — Ambulatory Visit: Payer: Medicare Other | Admitting: Nurse Practitioner

## 2023-03-14 ENCOUNTER — Other Ambulatory Visit
Admission: RE | Admit: 2023-03-14 | Discharge: 2023-03-14 | Disposition: A | Discharge status: Home / Self Care | Payer: Medicare Other | Source: Ambulatory Visit | Attending: Orthopaedic Surgery | Admitting: Orthopaedic Surgery

## 2023-03-14 DIAGNOSIS — M25562 Pain in left knee: Secondary | ICD-10-CM | POA: Diagnosis not present

## 2023-03-14 LAB — CBC WITH DIFFERENTIAL/PLATELET
Abs Immature Granulocytes: 0.09 10*3/uL — ABNORMAL HIGH (ref 0.00–0.07)
Basophils Absolute: 0.1 10*3/uL (ref 0.0–0.1)
Basophils Relative: 1 %
Eosinophils Absolute: 0.2 10*3/uL (ref 0.0–0.5)
Eosinophils Relative: 2 %
HCT: 38.5 % (ref 36.0–46.0)
Hemoglobin: 12.1 g/dL (ref 12.0–15.0)
Immature Granulocytes: 1 %
Lymphocytes Relative: 29 %
Lymphs Abs: 3.5 10*3/uL (ref 0.7–4.0)
MCH: 29.3 pg (ref 26.0–34.0)
MCHC: 31.4 g/dL (ref 30.0–36.0)
MCV: 93.2 fL (ref 80.0–100.0)
Monocytes Absolute: 0.7 10*3/uL (ref 0.1–1.0)
Monocytes Relative: 6 %
Neutro Abs: 7.3 10*3/uL (ref 1.7–7.7)
Neutrophils Relative %: 61 %
Platelets: 274 10*3/uL (ref 150–400)
RBC: 4.13 MIL/uL (ref 3.87–5.11)
RDW: 14.5 % (ref 11.5–15.5)
WBC: 11.9 10*3/uL — ABNORMAL HIGH (ref 4.0–10.5)
nRBC: 0 % (ref 0.0–0.2)

## 2023-03-14 LAB — SEDIMENTATION RATE: Sed Rate: 45 mm/hr — ABNORMAL HIGH (ref 0–30)

## 2023-03-14 LAB — C-REACTIVE PROTEIN: CRP: 2.7 mg/dL — ABNORMAL HIGH (ref <1.0)

## 2023-03-20 DIAGNOSIS — L578 Other skin changes due to chronic exposure to nonionizing radiation: Secondary | ICD-10-CM | POA: Diagnosis not present

## 2023-03-20 DIAGNOSIS — R208 Other disturbances of skin sensation: Secondary | ICD-10-CM | POA: Diagnosis not present

## 2023-03-20 DIAGNOSIS — L820 Inflamed seborrheic keratosis: Secondary | ICD-10-CM | POA: Diagnosis not present

## 2023-03-20 DIAGNOSIS — L538 Other specified erythematous conditions: Secondary | ICD-10-CM | POA: Diagnosis not present

## 2023-03-20 DIAGNOSIS — L853 Xerosis cutis: Secondary | ICD-10-CM | POA: Diagnosis not present

## 2023-03-21 ENCOUNTER — Ambulatory Visit (INDEPENDENT_AMBULATORY_CARE_PROVIDER_SITE_OTHER): Payer: Medicare Other | Admitting: Nurse Practitioner

## 2023-03-21 ENCOUNTER — Telehealth: Payer: Self-pay | Admitting: Internal Medicine

## 2023-03-21 ENCOUNTER — Encounter: Payer: Self-pay | Admitting: Nurse Practitioner

## 2023-03-21 VITALS — BP 112/61 | HR 67 | Ht 64.5 in | Wt 271.6 lb

## 2023-03-21 DIAGNOSIS — E559 Vitamin D deficiency, unspecified: Secondary | ICD-10-CM | POA: Diagnosis not present

## 2023-03-21 DIAGNOSIS — E782 Mixed hyperlipidemia: Secondary | ICD-10-CM | POA: Diagnosis not present

## 2023-03-21 DIAGNOSIS — E1165 Type 2 diabetes mellitus with hyperglycemia: Secondary | ICD-10-CM | POA: Diagnosis not present

## 2023-03-21 DIAGNOSIS — I10 Essential (primary) hypertension: Secondary | ICD-10-CM

## 2023-03-21 DIAGNOSIS — E278 Other specified disorders of adrenal gland: Secondary | ICD-10-CM

## 2023-03-21 DIAGNOSIS — Z794 Long term (current) use of insulin: Secondary | ICD-10-CM | POA: Diagnosis not present

## 2023-03-21 LAB — POCT GLYCOSYLATED HEMOGLOBIN (HGB A1C): Hemoglobin A1C: 7.6 % — AB (ref 4.0–5.6)

## 2023-03-21 MED ORDER — HUMALOG JUNIOR KWIKPEN 100 UNIT/ML ~~LOC~~ SOPN: 8.0000 [IU] | PEN_INJECTOR | Freq: Three times a day (TID) | SUBCUTANEOUS | Status: DC

## 2023-03-21 NOTE — Telephone Encounter (Signed)
Medication needs to be changed from the Humalog Junior kwikpen to just the generic Humalog.

## 2023-03-21 NOTE — Telephone Encounter (Signed)
Patient just called and said her medicare will only cover Generic humalog. The prescription is called Insulin lispro (HUMALOG  KWIKPEN) 100 UNIT/MLL. The pharmacy she uses [facility] - [place], [place] - [address] [facility] [address] [place], [place] [address] Phone: [phone]  Fax: [phone]  Her number is [phone].

## 2023-03-21 NOTE — Progress Notes (Signed)
Endocrinology Follow Up Note       03/21/2023, 12:59 PM   Subjective:    Patient ID: Denise Sharp, female    DOB: [birth year].  [name] is being seen in follow up after being seen in consultation for management of currently uncontrolled symptomatic diabetes requested by  Sherlene Shams, MD.   Past Medical History:  Diagnosis Date   (HFpEF) heart failure with preserved ejection fraction (HCC)    a. 12/2019 Echo: EF 65-70%, Gr2 DD. No significant valvular dzs.   Abnormality of gait 03/25/2013   Adrenal mass, left (HCC)    Anginal pain (HCC)    Anxiety    Aortic atherosclerosis (HCC)    Arthritis    Asthma    CAD (coronary artery disease) with h/o Atypical Chest Pain    a. 04/1986 Cath ([facility]): nl cors, EF 65%; b. 07/2012 Cath [facility]): Diff minor irregs; c. 07/2016 MV [facility]): "Equivocal"; d. 08/2016 Cardiac CT Ca2+ score [facility]): Ca2+ 1548; e. 05/2019 MV: No ischemia. EF 75%; f. 12/2019 PCI: LM nl, LAD 65ost (3.5x12 Resolute DES), 41m (2.75x12 Resolute DES),LCX/RCA nl; g. 08/2020 Cath: patent LAD stents, RCA 40ost, elev RH pressures, EF >65%.   Cervical spinal stenosis 1994   due to trauma to back ([employer] accident), has intermittent paralysis and parasthesias   Cervicogenic headache 03/23/2014   CKD (chronic kidney disease), stage III (HCC)    Depression    Diverticulosis    Dizziness    a.) chronic   DJD (degenerative joint disease)    a. Chronic R shoulder pain   Dyspnea    Esophageal stenosis 09/2009   a.) transient outlet obstruction by food, cleared by EGD   Family history of adverse reaction to anesthesia    a.) daughter with (+) PONV   Gastric bypass status for obesity    GERD (gastroesophageal reflux disease)    Headache(784.0)    HLD (hyperlipidemia)    Hypertension    IBS (irritable bowel syndrome)    IDA (iron deficiency anemia)    a.) post 2 unit txfsn 2009, normal endo/colonoscopy by  [facility]   Left bundle branch block (LBBB)    a.) Intermittently present - likely rate related.   NSVT (nonsustained ventricular tachycardia) (HCC) 11/15/2020   a.) Holter study 11/15/2020; 11 episodes of NSVT lasting up to 8 beats with a maximum rate of 210 bpm; 14 atrial runs lasting up to 18 beats with a rate of up to 250 bpm.  Some atrial runs felt to be NSVT.   NSVT (nonsustained ventricular tachycardia) (HCC)    a. 10/2020 [facility]: 11 runs of NSVT up to 8 beats.   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 10/2020 [facility]: 14 atrial runs up to 18 beats, max HR 250.   Restless leg syndrome    Rotator cuff arthropathy, right 08/13/2017   Syncope and collapse 03/12/2014   Type II diabetes mellitus (HCC)     Past Surgical History:  Procedure Laterality Date   APPENDECTOMY  BACK SURGERY     CARDIOVERSION N/A 10/30/2022   Procedure: CARDIOVERSION;  Surgeon: [name], MD;  Location: [facility] OR[facility];  Service: Cardiovascular;  Laterality: N/A;   CHOLECYSTECTOMY N/A 1976   COLONOSCOPY     CORONARY ANGIOPLASTY     CORONARY STENT INTERVENTION N/A 01/04/2020   Procedure: CORONARY STENT INTERVENTION;  Surgeon: Iran Ouch, MD;  Location: [facility];  Service: Cardiovascular;  Laterality: N/A;  LAD    DIAPHRAGMATIC HERNIA REPAIR  2015   ESOPHAGEAL DILATION     multiple   ESOPHAGOGASTRODUODENOSCOPY (EGD) WITH PROPOFOL N/A 05/11/2021   Procedure: ESOPHAGOGASTRODUODENOSCOPY (EGD) WITH PROPOFOL;  Surgeon: [name], MD;  Location: [facility] END[facility];  Service: Endoscopy;  Laterality: N/A;   ESOPHAGOGASTRODUODENOSCOPY (EGD) WITH PROPOFOL N/A 06/13/2021   Procedure: ESOPHAGOGASTRODUODENOSCOPY (EGD) WITH PROPOFOL;  Surgeon: [name], MD;  Location: [facility];  Service: Endoscopy;  Laterality: N/A;   ESOPHAGOGASTRODUODENOSCOPY (EGD) WITH  PROPOFOL N/A 02/28/2023   Procedure: ESOPHAGOGASTRODUODENOSCOPY (EGD) WITH PROPOFOL;  Surgeon: [name], MD;  Location: [facility] ENDOSCOPY;  Service: Endoscopy;  Laterality: N/A;   EYE SURGERY     GASTRIC BYPASS  2000, 2005   Dr. Arlana Pouch IMPLANT PLACEMENT  10/2011   Jeannette How IMPLANT PLACEMENT N/A 09/18/2021   Procedure: Leane Platt IMPLANT FIRST STAGE;  Surgeon: Alfredo Martinez, MD;  Location: [facility];  Service: Urology;  Laterality: N/A;   INTERSTIM IMPLANT PLACEMENT N/A 09/18/2021   Procedure: Leane Platt IMPLANT SECOND STAGE WITH IMPEDENCE CHECK;  Surgeon: Alfredo Martinez, MD;  Location: [facility];  Service: Urology;  Laterality: N/A;   INTERSTIM IMPLANT REMOVAL N/A 09/18/2021   Procedure: REMOVAL OF INTERSTIM IMPLANT;  Surgeon: Alfredo Martinez, MD;  Location: [facility];  Service: Urology;  Laterality: N/A;   LEFT HEART CATH AND CORONARY ANGIOGRAPHY N/A 04/1986   LEFT HEART CATH AND CORONARY ANGIOGRAPHY Left 07/24/2012   LEFT HEART CATH AND CORS/GRAFTS ANGIOGRAPHY N/A 12/31/2019   Procedure: LEFT HEART CATH AND CORS/GRAFTS ANGIOGRAPHY;  Surgeon: Antonieta Iba, MD;  Location: [facility];  Service: Cardiovascular;  Laterality: N/A;   PANNICULECTOMY N/A 06/16/2019   Procedure: PANNICULECTOMY;  Surgeon: Allena Napoleon, MD;  Location: [facility];  Service: Plastics;  Laterality: N/A;  3 hours, please   REVERSE SHOULDER ARTHROPLASTY Right 08/13/2017   Procedure: REVERSE RIGHT SHOULDER ARTHROPLASTY;  Surgeon: Teryl Lucy, MD;  Location: [facility];  Service: Orthopedics;  Laterality: Right;   RIGHT/LEFT HEART CATH AND CORONARY ANGIOGRAPHY N/A 09/06/2020   Procedure: RIGHT/LEFT HEART CATH AND CORONARY ANGIOGRAPHY;  Surgeon: [name], MD;  Location: [facility];  Service: Cardiovascular;  Laterality: N/A;   RIGHT/LEFT HEART CATH AND CORONARY ANGIOGRAPHY N/A 06/04/2022   Procedure: RIGHT/LEFT HEART CATH AND CORONARY ANGIOGRAPHY;  Surgeon: [name], MD;  Location: [facility];  Service: Cardiovascular;  Laterality: N/A;   ROTATOR CUFF REPAIR Right    SPINE SURGERY  1995   [place]   TOOTH EXTRACTION  11/13/2021   TOTAL ABDOMINAL HYSTERECTOMY W/ BILATERAL SALPINGOOPHORECTOMY  1974   TOTAL KNEE ARTHROPLASTY Bilateral 2007   Procedure: TOTAL KNEE ARTHROPLASTY; Surgeons: Gerrit Heck, MD and [name], MD   UMBILICAL HERNIA REPAIR  08/11/2015    Social History   Socioeconomic History   Marital status: Married    Spouse name: Alinda Money    Number of children: 2   Years of education: College    Highest education level: Not on file  Occupational History    Employer: retired  Tobacco  Use   Smoking status: Never    Passive exposure: Never   Smokeless tobacco: Never  Vaping Use   Vaping status: Never Used  Substance and Sexual Activity   Alcohol use: No   Drug use: Not Currently    Comment: prescribed valium   Sexual activity: Not Currently    Birth control/protection: Post-menopausal, Surgical  Other Topics Concern   Not on file  Social History Narrative   Patient lives at home with husband Alinda Money.    Right Handed   Drinks caffeinated tea occasionally   One pet in house, dog   Social Determinants of Health   Financial Resource Strain: Low Risk  (09/15/2021)   Overall Financial Resource Strain (CARDIA)    Difficulty of Paying Living Expenses: Not hard at all  Food Insecurity: No Food Insecurity (10/19/2020)   Hunger Vital Sign    Worried About Running Out of Food in the Last Year: Never true    Ran Out of Food in the Last Year: Never true  Transportation Needs: No Transportation Needs (10/19/2020)   PRAPARE - Administrator, Civil Service (Medical): No    Lack of Transportation (Non-Medical): No  Physical Activity: Insufficiently Active (11/15/2020)   Exercise Vital Sign    Days of Exercise per Week: 2 days    Minutes of Exercise per Session: 40 min  Stress: Stress Concern Present (08/03/2020)   [employer] - Occupational Stress Questionnaire    Feeling of Stress : Rather much  Social Connections: Not on file    Family History  Problem Relation Age of Onset   Heart disease Father    Hypertension Father    Prostate cancer Father    Stroke Father    Osteoporosis Father    Stroke Mother    Depression Mother    Headache Mother    Heart disease Mother    Thyroid disease Mother    Hypertension Mother    Diabetes Daughter    Heart disease Daughter    Hypertension Daughter    Hypertension Son     Outpatient Encounter Medications as of 03/21/2023  Medication Sig   albuterol (VENTOLIN HFA) 108 (90 Base) MCG/ACT inhaler Inhale 1-2 puffs into the lungs every 6 (six) hours as needed for wheezing or shortness of breath.   apixaban (ELIQUIS) 5 MG TABS tablet Take 1 tablet (5 mg total) by mouth 2 (two) times daily.   atorvastatin (LIPITOR) 80 MG tablet Take 1 tablet (80 mg total) by mouth daily.   benzonatate (TESSALON) 200 MG capsule Take 1 capsule (200 mg total) by mouth 2 (two) times daily as needed for cough.   budesonide-formoterol (SYMBICORT) 160-4.5 MCG/ACT inhaler Inhale 2 puffs into the lungs 2 (two) times daily.   butalbital-acetaminophen-caffeine (FIORICET) 50-325-40 MG tablet TAKE 1 TABLET BY MOUTH EVERY 6 HOURS AS NEEDED FOR HEADACHE.   CELEBREX 100 MG capsule Take 100 mg by mouth daily.   Cholecalciferol (VITAMIN D3) 250 MCG (10000 UT) capsule Take 10,000 Units by mouth daily.   clotrimazole-betamethasone (LOTRISONE) cream Apply 1 application topically 2 (two) times daily as needed (irritation).   conjugated estrogens (PREMARIN) vaginal cream Apply one pea-sized amount around the opening of the urethra daily for 2 weeks, then 3 times weekly moving forward.   Continuous Blood Gluc Receiver (FREESTYLE LIBRE 2 READER) DEVI Use to check sugar at least TID   cyanocobalamin (VITAMIN B12) 1000 MCG/ML injection Inject 1 mL (1,000 mcg total) into the muscle every  30 (thirty) days. Inject 1 ml (1000 mcg ) IM weekly x 4,  Then monthly thereafter   dapagliflozin propanediol (FARXIGA) 10 MG TABS tablet Take 1 tablet (10 mg total) by mouth daily.   dicyclomine (BENTYL) 20 MG tablet Take 1 tablet (20 mg total) by mouth 3 (three) times daily before meals.   DULoxetine (CYMBALTA) 30 MG capsule Take 1 capsule (30 mg total) by mouth daily.   ezetimibe (ZETIA) 10 MG tablet Take 1 tablet (10 mg total) by mouth daily.   fexofenadine (ALLEGRA) 180 MG tablet Take 180 mg by mouth as needed for allergies or rhinitis.   fluticasone (FLONASE) 50 MCG/ACT nasal spray Place 2 sprays into both nostrils daily. (Patient taking differently: Place 2 sprays into both nostrils 3 (three) times daily as needed for allergies.)   furosemide (LASIX) 20 MG tablet Take 1 tablet (20 mg total) by mouth daily.   Glucagon (GVOKE HYPOPEN 2-PACK) 0.5 MG/0.1ML SOAJ Inject 1 auto injector subcutaneously as needed for severe hypoglycemia. May repeat x 1 after 15 minutes if needed.   glucose blood (ONETOUCH VERIO) test strip Use to check blood sugar twice daily.   insulin degludec (TRESIBA FLEXTOUCH) 100 UNIT/ML FlexTouch Pen Inject 12 Units into the skin daily.   Insulin Pen Needle (DROPLET PEN NEEDLES) 31G X 5 MM MISC USE ONE NEEDLE SUBCUTANEOUSLY AS DIRECTED (REMOVE AND DISCARD NEEDLE IN SHARPS CONTAINER IMMEDIATELY AFTER USE)   lipase/protease/amylase (CREON) 36000 UNITS CPEP capsule Take 2 capsules (72,000 Units total) by mouth 3 (three) times daily before meals AND 1 capsule (36,000 Units total) with snacks.   loperamide (IMODIUM) 2 MG capsule Take 4-6 mg by mouth 3 (three) times daily before meals.   magnesium gluconate (MAGONATE) 500 MG tablet Take 500 mg by mouth daily.   metoprolol succinate (TOPROL-XL) 25 MG 24 hr tablet Take 0.5 tablets (12.5 mg total) by mouth daily. Take with or immediately following a meal.   nitroGLYCERIN (NITROSTAT) 0.4 MG SL tablet Place 1 tablet (0.4 mg total) under  the tongue every 5 (five) minutes as needed for chest pain.   nystatin (MYCOSTATIN/NYSTOP) powder Apply 1 g topically 4 (four) times daily as needed (rash).   ondansetron (ZOFRAN-ODT) 4 MG disintegrating tablet Take 1 tablet (4 mg total) by mouth every 8 (eight) hours as needed for nausea or vomiting.   OneTouch Delica Lancets 33G MISC Used to check blood sugar two times a day.   OVER THE COUNTER MEDICATION Apply 1 application  topically daily as needed (pain). [place] Gel topical pain reliever   pantoprazole (PROTONIX) 40 MG tablet Take 1 tablet (40 mg total) by mouth daily.   Polyethyl Glycol-Propyl Glycol (SYSTANE OP) Place 1 drop into both eyes 4 (four) times daily as needed (dry eyes).   pregabalin (LYRICA) 50 MG capsule Take 1 capsule (50 mg total) by mouth 2 (two) times daily.   sucralfate (CARAFATE) 1 g tablet Take 1 tablet (1 g total) by mouth 3 (three) times daily before meals.   SYRINGE-NEEDLE, DISP, 3 ML (LUER LOCK SAFETY SYRINGES) 25G X 1" 3 ML MISC Use to give b12 injections once monthly   [DISCONTINUED] Insulin lispro (HUMALOG JUNIOR KWIKPEN) 100 UNIT/ML 10-20 units qac usign sliding scale   Insulin lispro (HUMALOG JUNIOR KWIKPEN) 100 UNIT/ML Inject 8-14 Units into the skin 3 (three) times daily. 10-20 units qac usign sliding scale   No facility-administered encounter medications on file as of 03/21/2023.    ALLERGIES: Allergies  Allergen Reactions   Demeclocycline Hives  Erythromycin Nausea And Vomiting and Other (See Comments)    Severe irritable bowel   Flagyl [Metronidazole] Nausea And Vomiting and Other (See Comments)    Severe irritable bowel   Glucophage [Metformin Hcl] Nausea And Vomiting and Other (See Comments)    "Sick" "I won't take anything that has metformin in it"   Tetracyclines & Related Hives and Rash   Furosemide Swelling and Other (See Comments)    Other reaction(s): Localized superficial swelling of skin  Has opposite effect   Tetracycline Other (See  Comments)   Diovan [Valsartan] Nausea Only        Sulfa Antibiotics Other (See Comments) and Rash    As child   Xanax [Alprazolam] Other (See Comments)    Hyperactivity     VACCINATION STATUS: Immunization History  Administered Date(s) Administered   Fluad Quad(high Dose 65+) 03/06/2019, 06/07/2020, 07/27/2021, 07/13/2022   Influenza Split 06/13/2011   Influenza, High Dose Seasonal PF 08/21/2018   Influenza,inj,Quad PF,6+ Mos 05/04/2014, 05/12/2015   Influenza-Unspecified 04/15/2012   Moderna Sars-Covid-2 Vaccination 02/02/2020, 03/01/2020, 09/01/2020   Pneumococcal Conjugate-13 08/11/2013, 01/06/2014   Pneumococcal Polysaccharide-23 01/19/2010, 09/03/2018   Tdap 04/18/2016, 11/20/2020    Diabetes She presents for her follow-up diabetic visit. She has type 2 diabetes mellitus. Onset time: Diagnosed at approx age of 40. Her disease course has been improving. There are no hypoglycemic associated symptoms. (syncope) Associated symptoms include fatigue, foot paresthesias, polydipsia and polyuria (on diuretics). There are no hypoglycemic complications. (Been known to have syncopal episodes in the past) Symptoms are stable. Diabetic complications include a CVA (incidental finding), heart disease, nephropathy and peripheral neuropathy. Risk factors for coronary artery disease include obesity, hypertension, diabetes mellitus, dyslipidemia, family history, sedentary lifestyle and post-menopausal. Current diabetic treatment includes intensive insulin program and oral agent (monotherapy). She is compliant with treatment most of the time. Her weight is increasing rapidly. She is following a generally unhealthy (she admits to eating on the go a lot due to various appointments) diet. When asked about meal planning, she reported none. She has not had a previous visit with a dietitian. She rarely participates in exercise. Her home blood glucose trend is decreasing steadily. Her overall blood glucose range is  140-180 mg/dl. (She presents today, accompanied by her husband, with her CGM showing fluctuating but overall improved glycemic profile.  Her POCT A1c today is 7.6%, improving from last visit of 7.8%.  She notes her PCP increased her Humalog starting at 10 units before meals about a week after she saw me.  Analysis of her CGM shows TIR 66%, TAR 34%, TBR 0% with a GMI of 7.4%.  She does have a tendency to drop pretty fast after her meals.  Her health and her husbands health has been a great stressor for her.  She was recently diagnosed with EPI (not yet started the Creon).  She was also recently started on Celebrex and she is concerned that she has rapidly gained weight since then.) An ACE inhibitor/angiotensin II receptor blocker is not being taken. She sees a podiatrist.Eye exam is current.     Review of systems  Constitutional: + rapidly increasing body weight, current Body mass index is 45.9 kg/m., + fatigue, no subjective hyperthermia, no subjective hypothermia Eyes: no blurry vision, no xerophthalmia ENT: no sore throat, no nodules palpated in throat, no dysphagia/odynophagia, no hoarseness Cardiovascular: no chest pain, no shortness of breath, no palpitations, + intermittent leg swelling Respiratory: no cough, + shortness of breath, + wheezing Gastrointestinal: no nausea/vomiting, +  chronic diarrhea with fecal incontinence (recently diagnosed with EPI- not yet started her Creon) Musculoskeletal: no muscle/joint aches, walks with walker Skin: no rashes, no hyperemia Neurological: no tremors, no numbness, no tingling, no dizziness Psychiatric: no depression, no anxiety  Objective:     BP 112/61 (BP Location: Left Arm, Patient Position: Sitting, Cuff Size: Large)   Pulse 67   Ht 5' 4.5" (1.638 m)   Wt 271 lb 9.6 oz (123.2 kg)   BMI 45.90 kg/m   Wt Readings from Last 3 Encounters:  03/21/23 271 lb 9.6 oz (123.2 kg)  03/07/23 259 lb 6.4 oz (117.7 kg)  03/04/23 261 lb 6.4 oz (118.6 kg)      BP Readings from Last 3 Encounters:  03/21/23 112/61  03/07/23 128/78  03/04/23 125/63     Physical Exam- Limited  Constitutional:  Body mass index is 45.9 kg/m. , not in acute distress, normal state of mind Eyes:  EOMI, no exophthalmos Musculoskeletal: no gross deformities, strength intact in all four extremities, no gross restriction of joint movements, walks with walker Skin:  no rashes, no hyperemia Neurological: no tremor with outstretched hands  Diabetic Foot Exam - Simple   No data filed     CMP ( most recent) CMP     Component Value Date/Time   NA 142 03/05/2023 1326   NA 141 06/21/2014 1349   K 4.4 03/05/2023 1326   K 4.0 06/21/2014 1349   CL 104 03/05/2023 1326   CL 106 06/21/2014 1349   CO2 21 03/05/2023 1326   CO2 24 06/21/2014 1349   GLUCOSE 156 (H) 03/05/2023 1326   GLUCOSE 217 (H) 02/13/2023 1228   GLUCOSE 174 (H) 06/21/2014 1349   BUN 13 03/05/2023 1326   BUN 12 06/21/2014 1349   CREATININE 1.22 (H) 03/05/2023 1326   CREATININE 1.14 (H) 07/13/2022 1529   CALCIUM 8.7 03/05/2023 1326   CALCIUM 8.7 06/21/2014 1349   PROT 7.0 12/14/2022 0038   PROT 7.3 10/30/2012 1436   ALBUMIN 3.3 (L) 12/14/2022 0038   ALBUMIN 3.0 (L) 10/30/2012 1436   AST 25 12/14/2022 0038   AST 16 10/30/2012 1436   ALT 22 12/14/2022 0038   ALT 19 10/30/2012 1436   ALKPHOS 127 (H) 12/14/2022 0038   ALKPHOS 125 10/30/2012 1436   BILITOT 0.5 12/14/2022 0038   BILITOT 0.2 10/30/2012 1436   GFRNONAA 37 (L) 02/13/2023 1228   GFRNONAA 57 (L) 06/21/2014 1349   GFRNONAA 55 (L) 02/25/2014 2153   GFRNONAA 70 12/13/2011 0919   GFRAA >60 03/23/2020 1204   GFRAA >60 06/21/2014 1349   GFRAA >60 02/25/2014 2153   GFRAA 80 12/13/2011 0919     Diabetic Labs (most recent): Lab Results  Component Value Date   HGBA1C 7.6 (A) 03/21/2023   HGBA1C 7.8 (A) 12/06/2022   HGBA1C 7.1 (H) 07/13/2022   MICROALBUR <0.7 11/21/2021   MICROALBUR 2.8 (H) 06/04/2017   MICROALBUR 9.5 (H)  06/29/2016     Lipid Panel ( most recent) Lipid Panel     Component Value Date/Time   CHOL 103 10/23/2022 1324   CHOL 155 02/27/2014 0420   TRIG 98 10/23/2022 1324   TRIG 121 02/27/2014 0420   HDL 38 (L) 10/23/2022 1324   HDL 43 02/27/2014 0420   CHOLHDL 2.7 10/23/2022 1324   VLDL 20 10/23/2022 1324   VLDL 24 02/27/2014 0420   LDLCALC 45 10/23/2022 1324   LDLCALC 114 (H) 07/13/2022 1529   LDLCALC 88 02/27/2014 0420  LDLDIRECT 126 (H) 07/13/2022 1529      Lab Results  Component Value Date   TSH 2.887 09/27/2022   TSH 2.209 02/08/2021   TSH 3.695 07/17/2020   TSH 3.50 01/01/2019   TSH 2.279 08/20/2017   TSH 4.400 06/16/2015   TSH 1.16 05/03/2015   TSH 0.408 (L) 10/29/2014   TSH 5.099 (H) 08/13/2014   TSH 0.93 12/03/2013   FREET4 1.10 05/03/2015           Assessment & Plan:   1) Type 2 diabetes mellitus with hyperglycemia, with long-term current use of insulin (HCC)  She presents today, accompanied by her husband, with her CGM showing fluctuating but overall improved glycemic profile.  Her POCT A1c today is 7.6%, improving from last visit of 7.8%.  She notes her PCP increased her Humalog starting at 10 units before meals about a week after she saw me.  Analysis of her CGM shows TIR 66%, TAR 34%, TBR 0% with a GMI of 7.4%.  She does have a tendency to drop pretty fast after her meals.  Her health and her husbands health has been a great stressor for her.  She was recently diagnosed with EPI (not yet started the Creon).  She was also recently started on Celebrex and she is concerned that she has rapidly gained weight since then.  - GLENNA KETCHAM has currently uncontrolled symptomatic type 2 DM since 77 years of age.   -Recent labs reviewed.  - I had a long discussion with her about the progressive nature of diabetes and the pathology behind its complications. -her diabetes is complicated by CAD, CHF, Neuropathy, CKD stage 3, CVA and she remains at a high risk for  more acute and chronic complications which include CAD, CVA, CKD, retinopathy, and neuropathy. These are all discussed in detail with her.  The following Lifestyle Medicine recommendations according to American College of Lifestyle Medicine [facility]) were discussed and offered to patient and she agrees to start the journey:  A. Whole Foods, Plant-based plate comprising of fruits and vegetables, plant-based proteins, whole-grain carbohydrates was discussed in detail with the patient.   A list for source of those nutrients were also provided to the patient.  Patient will use only water or unsweetened tea for hydration. B.  The need to stay away from risky substances including alcohol, smoking; obtaining 7 to 9 hours of restorative sleep, at least 150 minutes of moderate intensity exercise weekly, the importance of healthy social connections,  and stress reduction techniques were discussed. C.  A full color page of  Calorie density of various food groups per pound showing examples of each food groups was provided to the patient.  - Nutritional counseling repeated at each appointment due to patients tendency to fall back in to old habits.  - The patient admits there is a room for improvement in their diet and drink choices. -  Suggestion is made for the patient to avoid simple carbohydrates from their diet including Cakes, Sweet Desserts / Pastries, Ice Cream, Soda (diet and regular), Sweet Tea, Candies, Chips, Cookies, Sweet Pastries, Store Bought Juices, Alcohol in Excess of 1-2 drinks a day, Artificial Sweeteners, Coffee Creamer, and "Sugar-free" Products. This will help patient to have stable blood glucose profile and potentially avoid unintended weight gain.   - I encouraged the patient to switch to unprocessed or minimally processed complex starch and increased protein intake (animal or plant source), fruits, and vegetables.   - Patient is advised to stick to  a routine mealtimes to eat 3 meals a day and  avoid unnecessary snacks (to snack only to correct hypoglycemia).  - I have approached her with the following individualized plan to manage her diabetes and patient agrees:   -She is advised to continue Tresiba 12 units SQ nightly.  I did adjust her Novolog slightly to 8-14 units TID with meals if glucose is above 90 and she is eating (Specific instructions on how to titrate insulin dosage based on glucose readings given to patient in writing).  I decreased it today as she has a tendency to drop pretty suddenly after eating a meal, perhaps because the dose is too high.  She can continue her Farxiga 10 mg po daily.  I am also concerned that her glycemic control will change after she starts her medication for EPI as she may be able to digest more nutrients which may impact her glucose.  I did encourage her to reach out to me with any changes.  -she is encouraged to continue monitoring glucose 4 times daily (using her CGM), before meals and before bed, and to call the clinic if she has readings less than 70 or above 300 for 3 tests in a row.  - she is warned not to take insulin without proper monitoring per orders. - Adjustment parameters are given to her for hypo and hyperglycemia in writing.  - she is not a candidate for Metformin due to concurrent renal insufficiency, nor did she tolerate it in the past.  She also did not tolerate GLP1 products due to nausea previously.  - Specific targets for  A1c; LDL, HDL, and Triglycerides were discussed with the patient.  2) Blood Pressure /Hypertension:  her blood pressure is controlled to target.   she is advised to continue her current medications including Metoprolol 25 mg p.o. daily with breakfast and Lasix 20 mg po daily (says this was recently reduced due to signs of dehydration).  I am concerned with her recent, rapid weight gain and SOB, swelling.  I encouraged her to reach out to her cardiologist for advice, not to wait any longer.  3)  Lipids/Hyperlipidemia:    Review of her recent lipid panel from 10/23/22 showed controlled LDL at 45 .  she is advised to continue Lipitor 80 mg daily at bedtime and Zetia 10 mg po daily.  Side effects and precautions discussed with her.  4)  Weight/Diet:  her Body mass index is 45.9 kg/m.  -  clearly complicating her diabetes care.   she is a candidate for weight loss. I discussed with her the fact that loss of 5 - 10% of her  current body weight will have the most impact on her diabetes management.  Exercise, and detailed carbohydrates information provided  -  detailed on discharge instructions.  She has had gastric bypass surgery x 2.  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  Recent imaging of abdomen (done for different reason) shows stable mass, still consistent with benignity.  She will not need additional follow up for this.  6) Chronic Care/Health Maintenance: -she is not on ACEI/ARB and is on Statin medications and is encouraged to initiate and continue to follow up with Ophthalmology, Dentist, Podiatrist at least yearly or according to recommendations, and advised to stay away from smoking. I have recommended yearly flu vaccine and pneumonia vaccine at least every 5 years; moderate intensity exercise for up to 150 minutes weekly; and sleep for at least  7 hours a day.  - she is advised to maintain close follow up with Sherlene Shams, MD for primary care needs, as well as her other providers for optimal and coordinated care.  I do wonder if there could be a component of EPI affecting her digestion of foods.  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     I spent  44  minutes in the care of the patient today including review of labs from CMP, Lipids, Thyroid Function, Hematology (current and previous including abstractions from other facilities); face-to-face time discussing  her blood glucose readings/logs, discussing hypoglycemia  and hyperglycemia episodes and symptoms, medications doses, her options of short and long term treatment based on the latest standards of care / guidelines;  discussion about incorporating lifestyle medicine;  and documenting the encounter. Risk reduction counseling performed per USPSTF guidelines to reduce obesity and cardiovascular risk factors.     Please refer to Patient Instructions for Blood Glucose Monitoring and Insulin/Medications Dosing Guide"  in media tab for additional information. Please  also refer to " Patient Self Inventory" in the Media  tab for reviewed elements of pertinent patient history.  [name] participated in the discussions, expressed understanding, and voiced agreement with the above plans.  All questions were answered to her satisfaction. she is encouraged to contact clinic should she have any questions or concerns prior to her return visit.     Follow up plan: - Return in about 3 months (around 06/20/2023) for Diabetes F/U with A1c in office, No previsit labs, Bring meter and logs.    Ronny Bacon, [facility] [address] [place], [place] [address] Phone: [phone] Fax: [phone]  03/21/2023, 12:59 PM

## 2023-03-21 NOTE — Progress Notes (Deleted)
Cardiology Office Note    Date:  03/21/2023   ID:  Denise Sharp, DOB 07-11-1946, MRN [id number]  PCP:  Sherlene Shams, MD  Cardiologist:  [name], MD  Electrophysiologist:  None   Chief Complaint: ***  History of Present Illness:   Denise Sharp is a 76 y.o. female with history of ***  ***   Labs independently reviewed: 03/2023 - A1c 7.6 02/2023 - Hgb 12.1, PLT 274, BUN 13, serum creatinine 1.22, potassium 4.4 11/2022 - albumin 3.3, AST/ALT normal 10/2022 - TC 103, TG 98, HDL 38, LDL 45 09/2022 - magnesium 2.0, TSH normal  Past Medical History:  Diagnosis Date   (HFpEF) heart failure with preserved ejection fraction (HCC)    a. 12/2019 Echo: EF 65-70%, Gr2 DD. No significant valvular dzs.   Abnormality of gait 03/25/2013   Adrenal mass, left (HCC)    Anginal pain (HCC)    Anxiety    Aortic atherosclerosis (HCC)    Arthritis    Asthma    CAD (coronary artery disease) with h/o Atypical Chest Pain    a. 04/1986 Cath ([facility]): nl cors, EF 65%; b. 07/2012 Cath [facility]): Diff minor irregs; c. 07/2016 MV [facility]): "Equivocal"; d. 08/2016 Cardiac CT Ca2+ score [facility]): Ca2+ 1548; e. 05/2019 MV: No ischemia. EF 75%; f. 12/2019 PCI: LM nl, LAD 65ost (3.5x12 Resolute DES), 29m (2.75x12 Resolute DES),LCX/RCA nl; g. 08/2020 Cath: patent LAD stents, RCA 40ost, elev RH pressures, EF >65%.   Cervical spinal stenosis 1994   due to trauma to back ([employer] accident), has intermittent paralysis and parasthesias   Cervicogenic headache 03/23/2014   CKD (chronic kidney disease), stage III (HCC)    Depression    Diverticulosis    Dizziness    a.) chronic   DJD (degenerative joint disease)    a. Chronic R shoulder pain   Dyspnea    Esophageal stenosis 09/2009   a.) transient outlet obstruction by food, cleared by EGD   Family history of adverse reaction to anesthesia    a.) daughter with (+) PONV   Gastric bypass status for obesity    GERD (gastroesophageal reflux disease)     Headache(784.0)    HLD (hyperlipidemia)    Hypertension    IBS (irritable bowel syndrome)    IDA (iron deficiency anemia)    a.) post 2 unit txfsn 2009, normal endo/colonoscopy by [facility]   Left bundle branch block (LBBB)    a.) Intermittently present - likely rate related.   NSVT (nonsustained ventricular tachycardia) (HCC) 11/15/2020   a.) Holter study 11/15/2020; 11 episodes of NSVT lasting up to 8 beats with a maximum rate of 210 bpm; 14 atrial runs lasting up to 18 beats with a rate of up to 250 bpm.  Some atrial runs felt to be NSVT.   NSVT (nonsustained ventricular tachycardia) (HCC)    a. 10/2020 Zio: 11 runs of NSVT up to 8 beats.   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 10/2020 Zio: 14 atrial runs up to 18 beats, max HR 250.   Restless leg syndrome    Rotator cuff arthropathy, right 08/13/2017   Syncope and collapse 03/12/2014   Type II diabetes mellitus (HCC)     Past Surgical History:  Procedure Laterality Date   APPENDECTOMY     BACK SURGERY  CARDIOVERSION N/A 10/30/2022   Procedure: CARDIOVERSION;  Surgeon: [name], MD;  Location: [facility] ORS;  Service: Cardiovascular;  Laterality: N/A;   CHOLECYSTECTOMY N/A 1976   COLONOSCOPY     CORONARY ANGIOPLASTY     CORONARY STENT INTERVENTION N/A 01/04/2020   Procedure: CORONARY STENT INTERVENTION;  Surgeon: Iran Ouch, MD;  Location: [facility] INVASIVE CV LAB;  Service: Cardiovascular;  Laterality: N/A;  LAD    DIAPHRAGMATIC HERNIA REPAIR  2015   ESOPHAGEAL DILATION     multiple   ESOPHAGOGASTRODUODENOSCOPY (EGD) WITH PROPOFOL N/A 05/11/2021   Procedure: ESOPHAGOGASTRODUODENOSCOPY (EGD) WITH PROPOFOL;  Surgeon: [name], MD;  Location: [facility] ENDOS[facility];  Service: Endoscopy;  Laterality: N/A;   ESOPHAGOGASTRODUODENOSCOPY (EGD) WITH PROPOFOL N/A 06/13/2021   Procedure:  ESOPHAGOGASTRODUODENOSCOPY (EGD) WITH PROPOFOL;  Surgeon: [name], MD;  Location: [facility] END[facility];  Service: Endoscopy;  Laterality: N/A;   ESOPHAGOGASTRODUODENOSCOPY (EGD) WITH PROPOFOL N/A 02/28/2023   Procedure: ESOPHAGOGASTRODUODENOSCOPY (EGD) WITH PROPOFOL;  Surgeon: [name], MD;  Location: [facility];  Service: Endoscopy;  Laterality: N/A;   EYE SURGERY     GASTRIC BYPASS  2000, 2005   Dr. Arlana Pouch IMPLANT PLACEMENT  10/2011   Jeannette How IMPLANT PLACEMENT N/A 09/18/2021   Procedure: Leane Platt IMPLANT FIRST STAGE;  Surgeon: Alfredo Martinez, MD;  Location: [facility];  Service: Urology;  Laterality: N/A;   INTERSTIM IMPLANT PLACEMENT N/A 09/18/2021   Procedure: Leane Platt IMPLANT SECOND STAGE WITH IMPEDENCE CHECK;  Surgeon: Alfredo Martinez, MD;  Location: [facility];  Service: Urology;  Laterality: N/A;   INTERSTIM IMPLANT REMOVAL N/A 09/18/2021   Procedure: REMOVAL OF INTERSTIM IMPLANT;  Surgeon: Alfredo Martinez, MD;  Location: [facility];  Service: Urology;  Laterality: N/A;   LEFT HEART CATH AND CORONARY ANGIOGRAPHY N/A 04/1986   LEFT HEART CATH AND CORONARY ANGIOGRAPHY Left 07/24/2012   LEFT HEART CATH AND CORS/GRAFTS ANGIOGRAPHY N/A 12/31/2019   Procedure: LEFT HEART CATH AND CORS/GRAFTS ANGIOGRAPHY;  Surgeon: Antonieta Iba, MD;  Location: [facility];  Service: Cardiovascular;  Laterality: N/A;   PANNICULECTOMY N/A 06/16/2019   Procedure: PANNICULECTOMY;  Surgeon: Allena Napoleon, MD;  Location: [facility];  Service: Plastics;  Laterality: N/A;  3 hours, please   REVERSE SHOULDER ARTHROPLASTY Right 08/13/2017   Procedure: REVERSE RIGHT SHOULDER ARTHROPLASTY;  Surgeon: Teryl Lucy, MD;  Location: [facility] OR;  Service: Orthopedics;  Laterality: Right;   RIGHT/LEFT HEART CATH AND CORONARY ANGIOGRAPHY N/A 09/06/2020   Procedure: RIGHT/LEFT HEART CATH AND CORONARY ANGIOGRAPHY;  Surgeon: [name], MD;  Location: [facility];  Service:  Cardiovascular;  Laterality: N/A;   RIGHT/LEFT HEART CATH AND CORONARY ANGIOGRAPHY N/A 06/04/2022   Procedure: RIGHT/LEFT HEART CATH AND CORONARY ANGIOGRAPHY;  Surgeon: [name], MD;  Location: [facility];  Service: Cardiovascular;  Laterality: N/A;   ROTATOR CUFF REPAIR Right    SPINE SURGERY  1995   [place]   TOOTH EXTRACTION  11/13/2021   TOTAL ABDOMINAL HYSTERECTOMY W/ BILATERAL SALPINGOOPHORECTOMY  1974   TOTAL KNEE ARTHROPLASTY Bilateral 2007   Procedure: TOTAL KNEE ARTHROPLASTY; Surgeons: Gerrit Heck, MD and [name], MD   UMBILICAL HERNIA REPAIR  08/11/2015    Current Medications: No outpatient medications have been marked as taking for the 03/22/23 encounter (Appointment) with Sondra Barges, PA-C.    Allergies:   Demeclocycline, Erythromycin, Flagyl [metronidazole], Glucophage [metformin hcl], Tetracyclines & related, Furosemide, Tetracycline, Diovan [valsartan], Sulfa antibiotics, and Xanax [alprazolam]   Social History   Socioeconomic History  Marital status: Married    Spouse name: Alinda Money    Number of children: 2   Years of education: College    Highest education level: Not on file  Occupational History    Employer: retired  Tobacco Use   Smoking status: Never    Passive exposure: Never   Smokeless tobacco: Never  Vaping Use   Vaping status: Never Used  Substance and Sexual Activity   Alcohol use: No   Drug use: Not Currently    Comment: prescribed valium   Sexual activity: Not Currently    Birth control/protection: Post-menopausal, Surgical  Other Topics Concern   Not on file  Social History Narrative   Patient lives at home with husband Alinda Money.    Right Handed   Drinks caffeinated tea occasionally   One pet in house, dog   Social Determinants of Health   Financial Resource Strain: Low Risk  (09/15/2021)   Overall Financial Resource Strain (CARDIA)    Difficulty of Paying Living Expenses: Not hard at all  Food Insecurity: No Food Insecurity  (10/19/2020)   Hunger Vital Sign    Worried About Running Out of Food in the Last Year: Never true    Ran Out of Food in the Last Year: Never true  Transportation Needs: No Transportation Needs (10/19/2020)   PRAPARE - Administrator, Civil Service (Medical): No    Lack of Transportation (Non-Medical): No  Physical Activity: Insufficiently Active (11/15/2020)   Exercise Vital Sign    Days of Exercise per Week: 2 days    Minutes of Exercise per Session: 40 min  Stress: Stress Concern Present (08/03/2020)   [employer] of Occupational Health - Occupational Stress Questionnaire    Feeling of Stress : Rather much  Social Connections: Not on file     Family History:  The patient's family history includes Depression in her mother; Diabetes in her daughter; Headache in her mother; Heart disease in her daughter, father, and mother; Hypertension in her daughter, father, mother, and son; Osteoporosis in her father; Prostate cancer in her father; Stroke in her father and mother; Thyroid disease in her mother.  ROS:   12-point review of systems is negative unless otherwise noted in the HPI.   EKGs/Labs/Other Studies Reviewed:    Studies reviewed were summarized above. The additional studies were reviewed today:  R/LHC 06/04/2022: Conclusions: Mild-moderate, non-obstructive coronary artery disease, similar to prior catheterization in 08/2020. Widely patent ostial LAD stent. Patent mid LAD stent with minimal in stent restenosis (~10% narrowing). Upper normal left and right heart filling pressures. Borderline elevated pulmonary artery pressure. Normal to mildly reduced cardiac output/index.   Recommendations: No obvious findings to explain the patient's progressive symptoms and NSVT on recent event monitor. Restart metoprolol succinate 25 mg daily. Aggressive secondary prevention of coronary artery disease and medical therapy for chronic HFpEF. __________  Zio patch 04/2022:    The patient was enrolled for 30 days.   The predominant rhythm was sinus with an average rate of 76 bpm (range 56-122 bpm).   Six episodes of nonsustained ventricular tachycardia were observed, lasting up to 11 beats.   No sustained arrhythmia or prolonged pause occurred.  There was no evidence of atrial fibrillation/flutter.   Patient triggered events correspond to sinus rhythm, PACs, and PVCs.   Predominantly sinus rhythm with several episodes of nonsustained ventricular tachycardia.  No atrial fibrillation/flutter identified. __________  Carotid artery ultrasound 02/05/2022: Summary:  Right Carotid: The extracranial vessels were near-normal with only  minimal  wall thickening or plaque.   Left Carotid: The extracranial vessels were near-normal with only minimal  wall thickening or plaque.   Vertebrals:  Bilateral vertebral arteries demonstrate antegrade flow.  Subclavians: Normal flow hemodynamics were seen in bilateral subclavian arteries.  ___________  Eugenie Birks MPI 01/18/2022: Pharmacological myocardial perfusion imaging study with no significant  ischemia Normal wall motion, EF estimated at 95% No EKG changes concerning for ischemia at peak stress or in recovery. CT attenuation correction images with coronary calcification in the LAD Low risk scan __________  Lexiscan MPI 01/26/2021: Pharmacological myocardial perfusion imaging study with no significant  ischemia Normal wall motion, EF estimated at 96% No EKG changes concerning for ischemia at peak stress or in recovery. CT attenuation correction images with no significant aortic atherosclerosis, stents noted in the LAD Low risk scan __________  Zio patch 10/2020: The patient was monitored for 4 days, 22 hours. The predominant rhythm was sinus with an average rate of 77 bpm (range 53-121 bpm and sinus). There were rare PACs and PVCs. 11 episodes of nonsustained ventricular tachycardia were observed, lasting up to 8 beats with  a maximum rate of 210 bpm. 14 atrial runs lasting up to 18 beats with a maximum rate of 250 bpm occurred. Some episodes labeled as supraventricular tachycardia with aberrancy could represent nonsustained ventricular tachycardia. Patient triggered events correspond to sinus rhythm and sinus rhythm with PVCs and artifact.   Predominantly sinus rhythm with rare PACs and PVCs.  Several episodes of nonsustained ventricular tachycardia and supraventricular tachycardia were observed. __________  [facility] 09/06/2020: Conclusions: Non-obstructive coronary artery disease with 40% ostial RCA stenosis. Widely patient ostial/proximal and mid LAD stents, with the distal LAD tapering to a small vessel. Mildly elevated left heart and pulmonary artery pressures. Severely elevated right heart filling pressures. Low Fick cardiac output/index consistent with HFpEF.   Recommendations: Continue gentle diuresis. Encourage weight loss and exercise. Consider outpatient referral to advanced heart failure clinic for further evaluation/management of chronic HFpEF. Continue secondary prevention of coronary artery disease. __________  LHC 01/04/2020: Ost LAD to Prox LAD lesion is 80% stenosed. Post intervention, there is a 0% residual stenosis. Post intervention, there is a 0% residual stenosis. A drug-eluting stent was successfully placed using a STENT RESOLUTE ONYX 3.5X12. Mid LAD lesion is 80% stenosed. A drug-eluting stent was successfully placed using a STENT RESOLUTE ONYX [id number].   Successful PCI and drug-eluting stent placement to mid as well as ostial LAD.   Recommendations: Dual antiplatelet therapy for at least 6 months but should consider long-term dual antiplatelet therapy given that the ostial LAD stent extends a millimeter into the left main coronary artery Aggressive treatment of risk factors. Likely discharge home tomorrow if no issues. __________  LHC 12/31/2019: oronary angiography:  Coronary  dominance: Right  Left mainstem: Large vessel that bifurcates into the LAD and left circumflex, no significant disease noted  Left anterior descending (LAD): Large vessel that extends to the apical region, diagonal branch 2 of moderate size, 65% ostial/proximal LAD disease/regions of haziness, 80% mid LAD disease  Left circumflex (LCx): Large vessel with OM branch 2, no significant disease noted  Right coronary artery (RCA): Right dominant vessel with PL and PDA, no significant disease noted  Left ventriculography: Left ventricular systolic function is normal, LVEF is estimated at 55-65%, there is no significant mitral regurgitation , no significant aortic valve stenosis  Final Conclusions:  Moderate ostial/proximal LAD disease Severe mid LAD disease Normal ejection fraction  Recommendations:  Difficult  decision whether to proceed with aggressive intervention Recent negative stress test, symptoms with some typical and atypical features She is relatively immobile, she does report some dysphagia symptoms, unclear if she is appreciating more GI symptoms Recommend starting isosorbide 30 mg daily Images discussed with Dr. Okey Dupre -We will need to consider PCI of the mid LAD, consider FFR of ostial LAD if she continues to have anginal symptoms. Would recommend this be done at [facility], consider radial access given right groin access very difficult  __________  2D echo 12/30/2019: 1. Left ventricular ejection fraction, by estimation, is 65 to 70%. The  left ventricle has normal function. The left ventricle has no regional  wall motion abnormalities. Left ventricular diastolic parameters are  consistent with Grade II diastolic  dysfunction (pseudonormalization).   2. Right ventricular systolic function is normal. The right ventricular  size is normal.   3. The mitral valve is normal in structure. No evidence of mitral valve  regurgitation. No evidence of mitral stenosis.   4. The aortic  valve is normal in structure. Aortic valve regurgitation is  not visualized. No aortic stenosis is present.  __________  Eugenie Birks MPI 06/03/2019: There was no ST segment deviation noted during stress. There was no evidence of ischemia There is a small defect of mild severity present at rest. This is likely a diaphragmatic artifact. This is a low risk study. The left ventricular ejection fraction is hyperdynamic (75%). The study is normal. __________  2D echo 08/21/2017: - Left ventricle: The cavity size was normal. Wall thickness was    normal. Systolic function was vigorous. The estimated ejection    fraction was in the range of 65% to 70%. Wall motion was normal;    there were no regional wall motion abnormalities. Features are    consistent with a pseudonormal left ventricular filling pattern,    with concomitant abnormal relaxation and increased filling    pressure (grade 2 diastolic dysfunction).  - Pulmonary arteries: Systolic pressure was mildly increased. PA    peak pressure: 33 mm Hg (S).  __________  See CV studies in Epic for more remote studies   EKG:  EKG is ordered today.  The EKG ordered today demonstrates ***  Recent Labs: 05/31/2022: B Natriuretic Peptide 111.6 09/27/2022: Magnesium 2.0; TSH 2.887 12/14/2022: ALT 22 03/05/2023: BUN 13; Creatinine, Ser 1.22; Potassium 4.4; Sodium 142 03/14/2023: Hemoglobin 12.1; Platelets 274  Recent Lipid Panel    Component Value Date/Time   CHOL 103 10/23/2022 1324   CHOL 155 02/27/2014 0420   TRIG 98 10/23/2022 1324   TRIG 121 02/27/2014 0420   HDL 38 (L) 10/23/2022 1324   HDL 43 02/27/2014 0420   CHOLHDL 2.7 10/23/2022 1324   VLDL 20 10/23/2022 1324   VLDL 24 02/27/2014 0420   LDLCALC 45 10/23/2022 1324   LDLCALC 114 (H) 07/13/2022 1529   LDLCALC 88 02/27/2014 0420   LDLDIRECT 126 (H) 07/13/2022 1529    PHYSICAL EXAM:    VS:  There were no vitals taken for this visit.  BMI: There is no height or weight on file to  calculate BMI.  Physical Exam  Wt Readings from Last 3 Encounters:  03/21/23 271 lb 9.6 oz (123.2 kg)  03/07/23 259 lb 6.4 oz (117.7 kg)  03/04/23 261 lb 6.4 oz (118.6 kg)     ASSESSMENT & PLAN:   ***   {Are you ordering a CV Procedure (e.g. stress test, cath, DCCV, TEE, etc)?   Press F2        :  [id number]}     Disposition: F/u with Dr. Okey Dupre or an APP in ***.   Medication Adjustments/Labs and Tests Ordered: Current medicines are reviewed at length with the patient today.  Concerns regarding medicines are outlined above. Medication changes, Labs and Tests ordered today are summarized above and listed in the Patient Instructions accessible in Encounters.   Signed, Eula Listen, PA-C 03/21/2023 1:46 PM      [facility] [address] [place], [place] [address] [phone]

## 2023-03-22 ENCOUNTER — Ambulatory Visit: Payer: Medicare Other | Admitting: Physician Assistant

## 2023-03-22 DIAGNOSIS — M25562 Pain in left knee: Secondary | ICD-10-CM | POA: Diagnosis not present

## 2023-03-23 ENCOUNTER — Other Ambulatory Visit: Payer: Self-pay | Admitting: Family

## 2023-03-23 MED ORDER — INSULIN LISPRO 100 UNIT/ML IJ SOLN: 8.0000 [IU] | Freq: Three times a day (TID) | INTRAMUSCULAR | 11 refills | Status: DC

## 2023-03-26 ENCOUNTER — Inpatient Hospital Stay (HOSPITAL_BASED_OUTPATIENT_CLINIC_OR_DEPARTMENT_OTHER): Payer: Medicare Other | Admitting: Internal Medicine

## 2023-03-26 ENCOUNTER — Other Ambulatory Visit: Payer: Self-pay

## 2023-03-26 ENCOUNTER — Inpatient Hospital Stay: Payer: Medicare Other

## 2023-03-26 ENCOUNTER — Inpatient Hospital Stay: Payer: Medicare Other | Attending: Internal Medicine

## 2023-03-26 ENCOUNTER — Encounter: Payer: Self-pay | Admitting: Internal Medicine

## 2023-03-26 VITALS — BP 125/66 | HR 70 | Temp 98.2°F | Resp 18

## 2023-03-26 VITALS — BP 159/72 | HR 79 | Temp 99.2°F | Ht 64.5 in | Wt 269.4 lb

## 2023-03-26 DIAGNOSIS — N183 Chronic kidney disease, stage 3 unspecified: Secondary | ICD-10-CM | POA: Insufficient documentation

## 2023-03-26 DIAGNOSIS — G2581 Restless legs syndrome: Secondary | ICD-10-CM | POA: Diagnosis not present

## 2023-03-26 DIAGNOSIS — R601 Generalized edema: Secondary | ICD-10-CM | POA: Diagnosis not present

## 2023-03-26 DIAGNOSIS — D509 Iron deficiency anemia, unspecified: Secondary | ICD-10-CM | POA: Insufficient documentation

## 2023-03-26 DIAGNOSIS — E876 Hypokalemia: Secondary | ICD-10-CM | POA: Diagnosis not present

## 2023-03-26 DIAGNOSIS — Z7901 Long term (current) use of anticoagulants: Secondary | ICD-10-CM | POA: Diagnosis not present

## 2023-03-26 DIAGNOSIS — Z9884 Bariatric surgery status: Secondary | ICD-10-CM | POA: Insufficient documentation

## 2023-03-26 DIAGNOSIS — I5032 Chronic diastolic (congestive) heart failure: Secondary | ICD-10-CM | POA: Insufficient documentation

## 2023-03-26 DIAGNOSIS — M1990 Unspecified osteoarthritis, unspecified site: Secondary | ICD-10-CM | POA: Insufficient documentation

## 2023-03-26 DIAGNOSIS — G8929 Other chronic pain: Secondary | ICD-10-CM | POA: Diagnosis not present

## 2023-03-26 DIAGNOSIS — I130 Hypertensive heart and chronic kidney disease with heart failure and stage 1 through stage 4 chronic kidney disease, or unspecified chronic kidney disease: Secondary | ICD-10-CM | POA: Diagnosis not present

## 2023-03-26 DIAGNOSIS — Z6841 Body Mass Index (BMI) 40.0 and over, adult: Secondary | ICD-10-CM

## 2023-03-26 DIAGNOSIS — Z794 Long term (current) use of insulin: Secondary | ICD-10-CM | POA: Insufficient documentation

## 2023-03-26 DIAGNOSIS — K589 Irritable bowel syndrome without diarrhea: Secondary | ICD-10-CM | POA: Diagnosis not present

## 2023-03-26 DIAGNOSIS — E785 Hyperlipidemia, unspecified: Secondary | ICD-10-CM | POA: Insufficient documentation

## 2023-03-26 DIAGNOSIS — Z791 Long term (current) use of non-steroidal anti-inflammatories (NSAID): Secondary | ICD-10-CM | POA: Diagnosis not present

## 2023-03-26 DIAGNOSIS — E1142 Type 2 diabetes mellitus with diabetic polyneuropathy: Secondary | ICD-10-CM | POA: Diagnosis not present

## 2023-03-26 DIAGNOSIS — I509 Heart failure, unspecified: Secondary | ICD-10-CM | POA: Diagnosis not present

## 2023-03-26 DIAGNOSIS — K219 Gastro-esophageal reflux disease without esophagitis: Secondary | ICD-10-CM | POA: Insufficient documentation

## 2023-03-26 DIAGNOSIS — Z79899 Other long term (current) drug therapy: Secondary | ICD-10-CM | POA: Diagnosis not present

## 2023-03-26 DIAGNOSIS — E1165 Type 2 diabetes mellitus with hyperglycemia: Secondary | ICD-10-CM | POA: Diagnosis not present

## 2023-03-26 DIAGNOSIS — E611 Iron deficiency: Secondary | ICD-10-CM

## 2023-03-26 DIAGNOSIS — Z7951 Long term (current) use of inhaled steroids: Secondary | ICD-10-CM | POA: Insufficient documentation

## 2023-03-26 DIAGNOSIS — E538 Deficiency of other specified B group vitamins: Secondary | ICD-10-CM | POA: Diagnosis not present

## 2023-03-26 DIAGNOSIS — K909 Intestinal malabsorption, unspecified: Secondary | ICD-10-CM | POA: Insufficient documentation

## 2023-03-26 DIAGNOSIS — I2510 Atherosclerotic heart disease of native coronary artery without angina pectoris: Secondary | ICD-10-CM | POA: Insufficient documentation

## 2023-03-26 DIAGNOSIS — Z7984 Long term (current) use of oral hypoglycemic drugs: Secondary | ICD-10-CM | POA: Insufficient documentation

## 2023-03-26 DIAGNOSIS — E1122 Type 2 diabetes mellitus with diabetic chronic kidney disease: Secondary | ICD-10-CM | POA: Insufficient documentation

## 2023-03-26 LAB — CBC WITH DIFFERENTIAL/PLATELET
Abs Immature Granulocytes: 0.07 10*3/uL (ref 0.00–0.07)
Basophils Absolute: 0.1 10*3/uL (ref 0.0–0.1)
Basophils Relative: 1 %
Eosinophils Absolute: 0.3 10*3/uL (ref 0.0–0.5)
Eosinophils Relative: 2 %
HCT: 35.6 % — ABNORMAL LOW (ref 36.0–46.0)
Hemoglobin: 11.3 g/dL — ABNORMAL LOW (ref 12.0–15.0)
Immature Granulocytes: 1 %
Lymphocytes Relative: 25 %
Lymphs Abs: 3 10*3/uL (ref 0.7–4.0)
MCH: 29.3 pg (ref 26.0–34.0)
MCHC: 31.7 g/dL (ref 30.0–36.0)
MCV: 92.2 fL (ref 80.0–100.0)
Monocytes Absolute: 0.8 10*3/uL (ref 0.1–1.0)
Monocytes Relative: 7 %
Neutro Abs: 7.8 10*3/uL — ABNORMAL HIGH (ref 1.7–7.7)
Neutrophils Relative %: 64 %
Platelets: 264 10*3/uL (ref 150–400)
RBC: 3.86 MIL/uL — ABNORMAL LOW (ref 3.87–5.11)
RDW: 15 % (ref 11.5–15.5)
WBC: 12 10*3/uL — ABNORMAL HIGH (ref 4.0–10.5)
nRBC: 0 % (ref 0.0–0.2)

## 2023-03-26 LAB — COMPREHENSIVE METABOLIC PANEL
ALT: 16 U/L (ref 0–44)
AST: 18 U/L (ref 15–41)
Albumin: 3.2 g/dL — ABNORMAL LOW (ref 3.5–5.0)
Alkaline Phosphatase: 109 U/L (ref 38–126)
Anion gap: 9 (ref 5–15)
BUN: 10 mg/dL (ref 8–23)
CO2: 22 mmol/L (ref 22–32)
Calcium: 7.9 mg/dL — ABNORMAL LOW (ref 8.9–10.3)
Chloride: 109 mmol/L (ref 98–111)
Creatinine, Ser: 1.31 mg/dL — ABNORMAL HIGH (ref 0.44–1.00)
GFR, Estimated: 42 mL/min — ABNORMAL LOW (ref >60)
Glucose, Bld: 144 mg/dL — ABNORMAL HIGH (ref 70–99)
Potassium: 3.2 mmol/L — ABNORMAL LOW (ref 3.5–5.1)
Sodium: 140 mmol/L (ref 135–145)
Total Bilirubin: 0.5 mg/dL (ref 0.3–1.2)
Total Protein: 6.5 g/dL (ref 6.5–8.1)

## 2023-03-26 LAB — IRON AND TIBC
Iron: 35 ug/dL (ref 28–170)
Saturation Ratios: 15 % (ref 10.4–31.8)
TIBC: 232 ug/dL — ABNORMAL LOW (ref 250–450)
UIBC: 197 ug/dL

## 2023-03-26 LAB — FERRITIN: Ferritin: 262 ng/mL (ref 11–307)

## 2023-03-26 LAB — RETIC PANEL
Immature Retic Fract: 21.5 % — ABNORMAL HIGH (ref 2.3–15.9)
RBC.: 3.84 MIL/uL — ABNORMAL LOW (ref 3.87–5.11)
Retic Count, Absolute: 97.5 10*3/uL (ref 19.0–186.0)
Retic Ct Pct: 2.5 % (ref 0.4–3.1)
Reticulocyte Hemoglobin: 29.6 pg (ref >27.9)

## 2023-03-26 LAB — VITAMIN B12: Vitamin B-12: 204 pg/mL (ref 180–914)

## 2023-03-26 LAB — BRAIN NATRIURETIC PEPTIDE: B Natriuretic Peptide: 230.6 pg/mL — ABNORMAL HIGH (ref 0.0–100.0)

## 2023-03-26 MED ORDER — SODIUM CHLORIDE 0.9 % IV SOLN
510.0000 mg | Freq: Once | INTRAVENOUS | Status: DC
  Filled 2023-03-26: qty 17

## 2023-03-26 MED ORDER — SODIUM CHLORIDE 0.9 % IV SOLN
Freq: Once | INTRAVENOUS | Status: AC
  Filled 2023-03-26: qty 250

## 2023-03-26 MED ORDER — SODIUM CHLORIDE 0.9 % IV SOLN
510.0000 mg | Freq: Once | INTRAVENOUS | Status: AC
  Administered 2023-03-26: 510 mg via INTRAVENOUS
  Filled 2023-03-26: qty 17

## 2023-03-26 NOTE — Patient Instructions (Signed)
 Ferumoxytol Injection What is this medication? FERUMOXYTOL (FER ue MOX i tol) treats low levels of iron in your body (iron deficiency anemia). Iron is a mineral that plays an important role in making red blood cells, which carry oxygen from your lungs to the rest of your body. This medicine may be used for other purposes; ask your health care provider or pharmacist if you have questions. COMMON BRAND NAME(S): Feraheme What should I tell my care team before I take this medication? They need to know if you have any of these conditions: Anemia not caused by low iron levels High levels of iron in the blood Magnetic resonance imaging (MRI) test scheduled An unusual or allergic reaction to iron, other medications, foods, dyes, or preservatives Pregnant or trying to get pregnant Breastfeeding How should I use this medication? This medication is injected into a vein. It is given by your care team in a hospital or clinic setting. Talk to your care team the use of this medication in children. Special care may be needed. Overdosage: If you think you have taken too much of this medicine contact a poison control center or emergency room at once. NOTE: This medicine is only for you. Do not share this medicine with others. What if I miss a dose? It is important not to miss your dose. Call your care team if you are unable to keep an appointment. What may interact with this medication? Other iron products This list may not describe all possible interactions. Give your health care provider a list of all the medicines, herbs, non-prescription drugs, or dietary supplements you use. Also tell them if you smoke, drink alcohol, or use illegal drugs. Some items may interact with your medicine. What should I watch for while using this medication? Visit your care team regularly. Tell your care team if your symptoms do not start to get better or if they get worse. You may need blood work done while you are taking this  medication. You may need to follow a special diet. Talk to your care team. Foods that contain iron include: whole grains/cereals, dried fruits, beans, or peas, leafy green vegetables, and organ meats (liver, kidney). What side effects may I notice from receiving this medication? Side effects that you should report to your care team as soon as possible: Allergic reactions--skin rash, itching, hives, swelling of the face, lips, tongue, or throat Low blood pressure--dizziness, feeling faint or lightheaded, blurry vision Shortness of breath Side effects that usually do not require medical attention (report to your care team if they continue or are bothersome): Flushing Headache Joint pain Muscle pain Nausea Pain, redness, or irritation at injection site This list may not describe all possible side effects. Call your doctor for medical advice about side effects. You may report side effects to FDA at 1-800-FDA-1088. Where should I keep my medication? This medication is given in a hospital or clinic. It will not be stored at home. NOTE: This sheet is a summary. It may not cover all possible information. If you have questions about this medicine, talk to your doctor, pharmacist, or health care provider.  2024 Elsevier/Gold Standard (2022-12-07 00:00:00)

## 2023-03-26 NOTE — Progress Notes (Signed)
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 Finnegan] EGD/colonoscopy- ?2021 [Dr.Wohl]; NOV 2021-hemoglobin 10.  Iron saturation 5% ferritin 12. EGD- NOV 2022- Dr.Wohl.  # GASTRIC BYPASS x2 [2005]; CAD/CHF [s/p stent; Dr.End]; CKD stage III [Dr.Korrapati]; poorly controlled diabetes- [Dr.Tullo]  HISTORY OF PRESENTING ILLNESS: Alone.  Walking with a rolling walker.  Denise Sharp 77 y.o.  female with multiple medical comorbidities including CHF diuretics hypokalemia iron deficiency secondary gastric bypass is here for follow-up.  Patient feels like she has congestions in her chest. Has a dry cough. Using a sinus nasal spray. Hard to take a deep breath. Currently lasic 20 mg every other day.    Tx for fluid on knee,last week. Was biopsied, no results yet.(Artifical knee-left); Feet/legs swelling x1 week.   Dx with insufficient pancreas. Has urology appt next week to eval not being able to hold urine in. Patient complains of fatigue.  Complains of cramping.  Nausea no vomiting.    Had 7 moles taken off neck/face/shoulders.    Review of Systems  Constitutional:  Positive for malaise/fatigue. Negative for chills, diaphoresis, fever and weight loss.  HENT:  Negative for nosebleeds and sore throat.   Eyes:  Negative for double vision.  Respiratory:  Negative for cough, hemoptysis, sputum production, shortness of breath and wheezing.   Cardiovascular:  Negative for chest pain, palpitations, orthopnea and leg swelling.  Gastrointestinal:  Positive for nausea. Negative for abdominal pain, blood in stool,  constipation, heartburn, melena and vomiting.  Genitourinary:  Negative for dysuria, frequency and urgency.  Musculoskeletal:  Positive for back pain, joint pain and myalgias.  Skin: Negative.  Negative for itching and rash.  Neurological:  Negative for dizziness, tingling, focal weakness, weakness and headaches.  Endo/Heme/Allergies:  Does not bruise/bleed easily.  Psychiatric/Behavioral:  Negative for depression. The patient is not nervous/anxious and does not have insomnia.     MEDICAL HISTORY:  Past Medical History:  Diagnosis Date   (HFpEF) heart failure with preserved ejection fraction (HCC)    a. 12/2019 Echo: EF 65-70%, Gr2 DD. No significant valvular dzs.   Abnormality of gait 03/25/2013   Adrenal mass, left (HCC)    Anginal pain (HCC)    Anxiety    Aortic atherosclerosis (HCC)    Arthritis    Asthma    CAD (coronary artery disease) with h/o Atypical Chest Pain    a. 04/1986 Cath ([facility]): nl cors, EF 65%; b. 07/2012 Cath [facility]): Diff minor irregs; c. 07/2016 MV [facility]): "Equivocal"; d. 08/2016 Cardiac CT Ca2+ score [facility]): Ca2+ 1548; e. 05/2019 MV: No ischemia. EF 75%; f. 12/2019 PCI: LM nl, LAD 65ost (3.5x12 Resolute DES), 14m (2.75x12 Resolute DES),LCX/RCA nl; g. 08/2020 Cath: patent LAD stents, RCA 40ost, elev RH pressures, EF >65%.   Cervical spinal stenosis 1994   due to trauma to back ([employer] accident), has intermittent paralysis and parasthesias   Cervicogenic headache 03/23/2014   CKD (chronic kidney disease), stage III (HCC)    Depression    Diverticulosis    Dizziness    a.) chronic   DJD (degenerative joint disease)    a. Chronic R shoulder pain   Dyspnea    Esophageal stenosis  09/2009   a.) transient outlet obstruction by food, cleared by EGD   Family history of adverse reaction to anesthesia    a.) daughter with (+) PONV   Gastric bypass status for obesity    GERD (gastroesophageal reflux disease)    Headache(784.0)    HLD (hyperlipidemia)    Hypertension     IBS (irritable bowel syndrome)    IDA (iron deficiency anemia)    a.) post 2 unit txfsn 2009, normal endo/colonoscopy by [facility]   Left bundle branch block (LBBB)    a.) Intermittently present - likely rate related.   NSVT (nonsustained ventricular tachycardia) (HCC) 11/15/2020   a.) Holter study 11/15/2020; 11 episodes of NSVT lasting up to 8 beats with a maximum rate of 210 bpm; 14 atrial runs lasting up to 18 beats with a rate of up to 250 bpm.  Some atrial runs felt to be NSVT.   NSVT (nonsustained ventricular tachycardia) (HCC)    a. 10/2020 [facility]: 11 runs of NSVT up to 8 beats.   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 10/2020 [facility]: 14 atrial runs up to 18 beats, max HR 250.   Restless leg syndrome    Rotator cuff arthropathy, right 08/13/2017   Syncope and collapse 03/12/2014   Type II diabetes mellitus (HCC)     SURGICAL HISTORY: Past Surgical History:  Procedure Laterality Date   APPENDECTOMY     BACK SURGERY     CARDIOVERSION N/A 10/30/2022   Procedure: CARDIOVERSION;  Surgeon: [name], MD;  Location: [facility] ORS;  Service: Cardiovascular;  Laterality: N/A;   CHOLECYSTECTOMY N/A 1976   COLONOSCOPY     CORONARY ANGIOPLASTY     CORONARY STENT INTERVENTION N/A 01/04/2020   Procedure: CORONARY STENT INTERVENTION;  Surgeon: Iran Ouch, MD;  Location: [facility] INVASIVE CV LAB;  Service: Cardiovascular;  Laterality: N/A;  LAD    DIAPHRAGMATIC HERNIA REPAIR  2015   ESOPHAGEAL DILATION     multiple   ESOPHAGOGASTRODUODENOSCOPY (EGD) WITH PROPOFOL N/A 05/11/2021   Procedure: ESOPHAGOGASTRODUODENOSCOPY (EGD) WITH PROPOFOL;  Surgeon: [name], MD;  Location: [facility] ENDOSCOPY;  Service: Endoscopy;  Laterality: N/A;   ESOPHAGOGASTRODUODENOSCOPY (EGD) WITH PROPOFOL N/A 06/13/2021   Procedure: ESOPHAGOGASTRODUODENOSCOPY (EGD) WITH  PROPOFOL;  Surgeon: [name], MD;  Location: [facility] ENDOSCOPY;  Service: Endoscopy;  Laterality: N/A;   ESOPHAGOGASTRODUODENOSCOPY (EGD) WITH PROPOFOL N/A 02/28/2023   Procedure: ESOPHAGOGASTRODUODENOSCOPY (EGD) WITH PROPOFOL;  Surgeon: [name], MD;  Location: [facility] ENDOSCOPY;  Service: Endoscopy;  Laterality: N/A;   EYE SURGERY     GASTRIC BYPASS  2000, 2005   Dr. Arlana Pouch IMPLANT PLACEMENT  10/2011   Jeannette How IMPLANT PLACEMENT N/A 09/18/2021   Procedure: Leane Platt IMPLANT FIRST STAGE;  Surgeon: Alfredo Martinez, MD;  Location: [facility] ORS;  Service: Urology;  Laterality: N/A;   INTERSTIM IMPLANT PLACEMENT N/A 09/18/2021   Procedure: Leane Platt IMPLANT SECOND STAGE WITH IMPEDENCE CHECK;  Surgeon: Alfredo Martinez, MD;  Location: [facility] ORS;  Service: Urology;  Laterality: N/A;   INTERSTIM IMPLANT REMOVAL N/A 09/18/2021   Procedure: REMOVAL OF INTERSTIM IMPLANT;  Surgeon: Alfredo Martinez, MD;  Location: [facility] ORS;  Service: Urology;  Laterality: N/A;   LEFT HEART CATH AND CORONARY ANGIOGRAPHY N/A 04/1986   LEFT HEART CATH AND CORONARY ANGIOGRAPHY Left 07/24/2012   LEFT HEART CATH AND CORS/GRAFTS ANGIOGRAPHY  N/A 12/31/2019   Procedure: LEFT HEART CATH AND CORS/GRAFTS ANGIOGRAPHY;  Surgeon: Antonieta Iba, MD;  Location: [facility] INVASIVE CV LAB;  Service: Cardiovascular;  Laterality: N/A;   PANNICULECTOMY N/A 06/16/2019   Procedure: PANNICULECTOMY;  Surgeon: Allena Napoleon, MD;  Location: [facility] OR;  Service: Plastics;  Laterality: N/A;  3 hours, please   REVERSE SHOULDER ARTHROPLASTY Right 08/13/2017   Procedure: REVERSE RIGHT SHOULDER ARTHROPLASTY;  Surgeon: Teryl Lucy, MD;  Location: [facility] OR;  Service: Orthopedics;  Laterality: Right;   RIGHT/LEFT HEART CATH AND CORONARY ANGIOGRAPHY N/A 09/06/2020   Procedure: RIGHT/LEFT HEART CATH AND CORONARY ANGIOGRAPHY;  Surgeon: [name], MD;  Location: [facility] INVASIVE CV LAB;  Service: Cardiovascular;  Laterality: N/A;    RIGHT/LEFT HEART CATH AND CORONARY ANGIOGRAPHY N/A 06/04/2022   Procedure: RIGHT/LEFT HEART CATH AND CORONARY ANGIOGRAPHY;  Surgeon: [name], MD;  Location: [facility] INVASIVE CV LAB;  Service: Cardiovascular;  Laterality: N/A;   ROTATOR CUFF REPAIR Right    SPINE SURGERY  1995   [place]   TOOTH EXTRACTION  11/13/2021   TOTAL ABDOMINAL HYSTERECTOMY W/ BILATERAL SALPINGOOPHORECTOMY  1974   TOTAL KNEE ARTHROPLASTY Bilateral 2007   Procedure: TOTAL KNEE ARTHROPLASTY; Surgeons: Gerrit Heck, MD and [name], MD   UMBILICAL HERNIA REPAIR  08/11/2015    SOCIAL HISTORY: Social History   Socioeconomic History   Marital status: Married    Spouse name: Alinda Money    Number of children: 2   Years of education: College    Highest education level: Not on file  Occupational History    Employer: retired  Tobacco Use   Smoking status: Never    Passive exposure: Never   Smokeless tobacco: Never  Vaping Use   Vaping status: Never Used  Substance and Sexual Activity   Alcohol use: No   Drug use: Not Currently    Comment: prescribed valium   Sexual activity: Not Currently    Birth control/protection: Post-menopausal, Surgical  Other Topics Concern   Not on file  Social History Narrative   Patient lives at home with husband Alinda Money.    Right Handed   Drinks caffeinated tea occasionally   One pet in house, dog   Social Determinants of Health   Financial Resource Strain: Low Risk  (09/15/2021)   Overall Financial Resource Strain (CARDIA)    Difficulty of Paying Living Expenses: Not hard at all  Food Insecurity: No Food Insecurity (10/19/2020)   Hunger Vital Sign    Worried About Running Out of Food in the Last Year: Never true    Ran Out of Food in the Last Year: Never true  Transportation Needs: No Transportation Needs (10/19/2020)   PRAPARE - Administrator, Civil Service (Medical): No    Lack of Transportation (Non-Medical): No  Physical Activity: Insufficiently Active (11/15/2020)   Exercise  Vital Sign    Days of Exercise per Week: 2 days    Minutes of Exercise per Session: 40 min  Stress: Stress Concern Present (08/03/2020)   [employer] of Occupational Health - Occupational Stress Questionnaire    Feeling of Stress : Rather much  Social Connections: Not on file  Intimate Partner Violence: Not At Risk (10/19/2020)   Humiliation, Afraid, Rape, and Kick questionnaire    Fear of Current or Ex-Partner: No    Emotionally Abused: No    Physically Abused: No    Sexually Abused: No    FAMILY HISTORY: Family History  Problem Relation Age of Onset   Heart disease Father  Hypertension Father    Prostate cancer Father    Stroke Father    Osteoporosis Father    Stroke Mother    Depression Mother    Headache Mother    Heart disease Mother    Thyroid disease Mother    Hypertension Mother    Diabetes Daughter    Heart disease Daughter    Hypertension Daughter    Hypertension Son     ALLERGIES:  is allergic to demeclocycline, erythromycin, flagyl [metronidazole], glucophage [metformin hcl], tetracyclines & related, furosemide, tetracycline, diovan [valsartan], sulfa antibiotics, and xanax [alprazolam].  MEDICATIONS:  Current Outpatient Medications  Medication Sig Dispense Refill   albuterol (VENTOLIN HFA) 108 (90 Base) MCG/ACT inhaler Inhale 1-2 puffs into the lungs every 6 (six) hours as needed for wheezing or shortness of breath. 18 g 2   apixaban (ELIQUIS) 5 MG TABS tablet Take 1 tablet (5 mg total) by mouth 2 (two) times daily. 180 tablet 1   atorvastatin (LIPITOR) 80 MG tablet Take 1 tablet (80 mg total) by mouth daily. 90 tablet 1   benzonatate (TESSALON) 200 MG capsule Take 1 capsule (200 mg total) by mouth 2 (two) times daily as needed for cough. 20 capsule 0   budesonide-formoterol (SYMBICORT) 160-4.5 MCG/ACT inhaler Inhale 2 puffs into the lungs 2 (two) times daily. 1 each 6   butalbital-acetaminophen-caffeine (FIORICET) 50-325-40 MG tablet TAKE 1 TABLET BY  MOUTH EVERY 6 HOURS AS NEEDED FOR HEADACHE. 60 tablet 2   CELEBREX 100 MG capsule Take 100 mg by mouth daily.     Cholecalciferol (VITAMIN D3) 250 MCG (10000 UT) capsule Take 10,000 Units by mouth daily.     clotrimazole-betamethasone (LOTRISONE) cream Apply 1 application topically 2 (two) times daily as needed (irritation).     conjugated estrogens (PREMARIN) vaginal cream Apply one pea-sized amount around the opening of the urethra daily for 2 weeks, then 3 times weekly moving forward. 30 g 4   Continuous Blood Gluc Receiver (FREESTYLE LIBRE 2 READER) DEVI Use to check sugar at least TID 1 each 1   cyanocobalamin (VITAMIN B12) 1000 MCG/ML injection Inject 1 mL (1,000 mcg total) into the muscle every 30 (thirty) days. Inject 1 ml (1000 mcg ) IM weekly x 4,  Then monthly thereafter 3 mL 1   dapagliflozin propanediol (FARXIGA) 10 MG TABS tablet Take 1 tablet (10 mg total) by mouth daily. 90 tablet 3   dicyclomine (BENTYL) 20 MG tablet Take 1 tablet (20 mg total) by mouth 3 (three) times daily before meals. 180 tablet 2   DULoxetine (CYMBALTA) 30 MG capsule Take 1 capsule (30 mg total) by mouth daily. 90 capsule 4   ezetimibe (ZETIA) 10 MG tablet Take 1 tablet (10 mg total) by mouth daily. 90 tablet 3   fexofenadine (ALLEGRA) 180 MG tablet Take 180 mg by mouth as needed for allergies or rhinitis.     fluticasone (FLONASE) 50 MCG/ACT nasal spray Place 2 sprays into both nostrils daily. (Patient taking differently: Place 2 sprays into both nostrils 3 (three) times daily as needed for allergies.) 16 g 6   furosemide (LASIX) 20 MG tablet Take 1 tablet (20 mg total) by mouth daily. 90 tablet 3   Glucagon (GVOKE HYPOPEN 2-PACK) 0.5 MG/0.1ML SOAJ Inject 1 auto injector subcutaneously as needed for severe hypoglycemia. May repeat x 1 after 15 minutes if needed. 0.1 mL 2   glucose blood (ONETOUCH VERIO) test strip Use to check blood sugar twice daily. 100 each 1   insulin  degludec (TRESIBA FLEXTOUCH) 100  UNIT/ML FlexTouch Pen Inject 12 Units into the skin daily. 15 mL 3   insulin lispro (HUMALOG) 100 UNIT/ML injection Inject 0.08-0.14 mLs (8-14 Units total) into the skin 3 (three) times daily with meals. 10 mL 11   Insulin Pen Needle (DROPLET PEN NEEDLES) 31G X 5 MM MISC USE ONE NEEDLE SUBCUTANEOUSLY AS DIRECTED (REMOVE AND DISCARD NEEDLE IN SHARPS CONTAINER IMMEDIATELY AFTER USE) 100 each 1   lipase/protease/amylase (CREON) 36000 UNITS CPEP capsule Take 2 capsules (72,000 Units total) by mouth 3 (three) times daily before meals AND 1 capsule (36,000 Units total) with snacks. 300 capsule 9   loperamide (IMODIUM) 2 MG capsule Take 4-6 mg by mouth 3 (three) times daily before meals.     magnesium gluconate (MAGONATE) 500 MG tablet Take 500 mg by mouth daily.     metoprolol succinate (TOPROL-XL) 25 MG 24 hr tablet Take 0.5 tablets (12.5 mg total) by mouth daily. Take with or immediately following a meal. 45 tablet 3   nitroGLYCERIN (NITROSTAT) 0.4 MG SL tablet Place 1 tablet (0.4 mg total) under the tongue every 5 (five) minutes as needed for chest pain. 25 tablet 0   nystatin (MYCOSTATIN/NYSTOP) powder Apply 1 g topically 4 (four) times daily as needed (rash).     ondansetron (ZOFRAN-ODT) 4 MG disintegrating tablet Take 1 tablet (4 mg total) by mouth every 8 (eight) hours as needed for nausea or vomiting. 30 tablet 0   OneTouch Delica Lancets 33G MISC Used to check blood sugar two times a day. 100 each 4   OVER THE COUNTER MEDICATION Apply 1 application  topically daily as needed (pain). [place] Gel topical pain reliever     pantoprazole (PROTONIX) 40 MG tablet Take 1 tablet (40 mg total) by mouth daily. 90 tablet 3   Polyethyl Glycol-Propyl Glycol (SYSTANE OP) Place 1 drop into both eyes 4 (four) times daily as needed (dry eyes).     pregabalin (LYRICA) 50 MG capsule Take 1 capsule (50 mg total) by mouth 2 (two) times daily. 180 capsule 1   sucralfate (CARAFATE) 1 g tablet Take 1 tablet (1 g total) by  mouth 3 (three) times daily before meals. 270 tablet 3   SYRINGE-NEEDLE, DISP, 3 ML (LUER LOCK SAFETY SYRINGES) 25G X 1" 3 ML MISC Use to give b12 injections once monthly 12 each 0   No current facility-administered medications for this visit.      PHYSICAL EXAMINATION:   Vitals:   03/26/23 1424  BP: (!) 159/72  Pulse: 79  Temp: 99.2 F (37.3 C)  SpO2: 96%     Filed Weights   03/26/23 1424  Weight: 269 lb 6.4 oz (122.2 kg)      Physical Exam Constitutional:      Comments: Obese.  HENT:     Head: Normocephalic and atraumatic.     Mouth/Throat:     Pharynx: No oropharyngeal exudate.  Eyes:     Pupils: Pupils are equal, round, and reactive to light.  Cardiovascular:     Rate and Rhythm: Normal rate and regular rhythm.     Pulses: Normal pulses.     Heart sounds: Normal heart sounds.  Pulmonary:     Effort: Pulmonary effort is normal. No respiratory distress.     Breath sounds: Normal breath sounds. No wheezing.  Abdominal:     General: Bowel sounds are normal. There is no distension.     Palpations: Abdomen is soft. There is no mass.  Tenderness: There is no abdominal tenderness. There is no guarding or rebound.  Musculoskeletal:        General: No tenderness. Normal range of motion.     Cervical back: Normal range of motion and neck supple.  Skin:    General: Skin is warm.  Neurological:     Mental Status: She is alert and oriented to person, place, and time.  Psychiatric:        Mood and Affect: Affect normal.     LABORATORY DATA:  I have reviewed the data as listed Lab Results  Component Value Date   WBC 12.0 (H) 03/26/2023   HGB 11.3 (L) 03/26/2023   HCT 35.6 (L) 03/26/2023   MCV 92.2 03/26/2023   PLT 264 03/26/2023   Recent Labs    11/23/22 1403 12/13/22 2343 12/14/22 0038 02/13/23 1228 03/04/23 1122 03/05/23 1326 03/26/23 1430  NA 140 138  --  137 141 142 140  K 4.2 4.8  --  4.1 4.2 4.4 3.2*  CL 104 106  --  104 104 104 109  CO2  24 23  --  24 18* 21 22  GLUCOSE 169* 147*  --  217* 235* 156* 144*  BUN 21 16  --  22 14 13 10   CREATININE 1.24* 1.23*  --  1.45* 1.42* 1.22* 1.31*  CALCIUM 9.0 8.5*  --  8.5* 8.4* 8.7 7.9*  GFRNONAA 45* 45*  --  37*  --   --  42*  PROT 7.8  --  7.0  --   --   --  6.5  ALBUMIN 3.6  --  3.3*  --   --   --  3.2*  AST 20  --  25  --   --   --  18  ALT 18  --  22  --   --   --  16  ALKPHOS 131*  --  127*  --   --   --  109  BILITOT 0.5  --  0.5  --   --   --  0.5  BILIDIR  --   --  0.1  --   --   --   --   IBILI  --   --  0.4  --   --   --   --      No results found.  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   # hemoglobin today is 11.2. Iron studies/ferritin- pending today. Proceed with Feraheme today-see below. CT scan: MAY 2024- No acute process.  However, esophageal thickening ? Stricture [aug 2021]- [? EGD]s/p Dr.Wohl STABLE/better.   # Rodena Goldmann- Weight gain/bloated/ poor appetite- ? Etiology- ? Sec to CHF vs others. [Dr.End]-recommend lasix 20 day- awiating apt with Dr.End this week.   # Hypokalemia- 3.3- recommend K supplementation.   # B12 deficiency secondary gastric bypass at home iron injections- stable   # DISPOSITION: # ADD BNP # Ferrahem today # in 1 month- ferrahem  # follow up 4 month; MD: labs- cbc/cmp; B12 levels;Vit D 25-OH;   possible Ferrahem- Dr.B  All questions were answered. The patient knows to call the clinic with any problems, questions or concerns.    Earna Coder, MD 03/26/2023 3:24 PM

## 2023-03-26 NOTE — Assessment & Plan Note (Addendum)
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   # hemoglobin today is 11.2. Iron studies/ferritin- pending today. Proceed with Feraheme today-see below. CT scan: MAY 2024- No acute process.  However, esophageal thickening ? Stricture [aug 2021]- [? EGD]s/p Dr.Wohl STABLE/better.   # Rodena Goldmann- Weight gain/bloated/ poor appetite- ? Etiology- ? Sec to CHF vs others. [Dr.End]-recommend lasix 20 day- awiating apt with Dr.End this week.   # Hypokalemia- 3.3- recommend K supplementation.   # B12 deficiency secondary gastric bypass at home iron injections- stable   # DISPOSITION: # ADD BNP # Ferrahem today # in 1 month- ferrahem  # follow up 4 month; MD: labs- cbc/cmp; B12 levels;Vit D 25-OH;   possible Ferrahem- Dr.B

## 2023-03-26 NOTE — Progress Notes (Signed)
Fatigue/weakness: yes Dyspena: yes x2 weeks Light headedness: yes Blood in stool: yes-this past weekend not sure if was hemorrhoid   Pt feels like she has congestions in her chest. Has a dry cough. Using a sinus nasal spray. Hard to take a deep breath.  Tx for fluid on knee,last week. Was biopsied, no results yet.(Artifical knee-left)  Feet/legs selling x1 week.  Dx with insufficient pancreas.  Has urology appt next week to eval not being able to hold urine in.  Had 7 moles taken off neck/face/shoulders. No biopsies.  Spouse dx with metastatic  prostate cancer.

## 2023-03-28 ENCOUNTER — Ambulatory Visit: Payer: Medicare Other | Attending: Physician Assistant | Admitting: Internal Medicine

## 2023-03-28 ENCOUNTER — Emergency Department: Payer: Medicare Other

## 2023-03-28 ENCOUNTER — Encounter: Payer: Self-pay | Admitting: Internal Medicine

## 2023-03-28 ENCOUNTER — Inpatient Hospital Stay
Admission: EM | Admit: 2023-03-28 | Discharge: 2023-04-01 | DRG: 291 | Disposition: A | Discharge status: Home Health | Payer: Medicare Other | Source: Ambulatory Visit | Attending: Obstetrics and Gynecology | Admitting: Obstetrics and Gynecology

## 2023-03-28 ENCOUNTER — Other Ambulatory Visit: Payer: Self-pay

## 2023-03-28 VITALS — BP 132/76 | HR 64 | Ht 64.5 in | Wt 263.0 lb

## 2023-03-28 DIAGNOSIS — Z8679 Personal history of other diseases of the circulatory system: Secondary | ICD-10-CM

## 2023-03-28 DIAGNOSIS — I4819 Other persistent atrial fibrillation: Secondary | ICD-10-CM | POA: Insufficient documentation

## 2023-03-28 DIAGNOSIS — I700 Atherosclerosis of aorta: Secondary | ICD-10-CM | POA: Diagnosis present

## 2023-03-28 DIAGNOSIS — E1142 Type 2 diabetes mellitus with diabetic polyneuropathy: Secondary | ICD-10-CM | POA: Diagnosis present

## 2023-03-28 DIAGNOSIS — Z791 Long term (current) use of non-steroidal anti-inflammatories (NSAID): Secondary | ICD-10-CM

## 2023-03-28 DIAGNOSIS — Z794 Long term (current) use of insulin: Secondary | ICD-10-CM | POA: Diagnosis not present

## 2023-03-28 DIAGNOSIS — Z7901 Long term (current) use of anticoagulants: Secondary | ICD-10-CM

## 2023-03-28 DIAGNOSIS — I5030 Unspecified diastolic (congestive) heart failure: Secondary | ICD-10-CM | POA: Diagnosis present

## 2023-03-28 DIAGNOSIS — D500 Iron deficiency anemia secondary to blood loss (chronic): Secondary | ICD-10-CM | POA: Diagnosis not present

## 2023-03-28 DIAGNOSIS — I471 Supraventricular tachycardia, unspecified: Secondary | ICD-10-CM | POA: Diagnosis present

## 2023-03-28 DIAGNOSIS — Z888 Allergy status to other drugs, medicaments and biological substances status: Secondary | ICD-10-CM

## 2023-03-28 DIAGNOSIS — N183 Chronic kidney disease, stage 3 unspecified: Secondary | ICD-10-CM | POA: Diagnosis not present

## 2023-03-28 DIAGNOSIS — Z9861 Coronary angioplasty status: Secondary | ICD-10-CM

## 2023-03-28 DIAGNOSIS — Z882 Allergy status to sulfonamides status: Secondary | ICD-10-CM

## 2023-03-28 DIAGNOSIS — K580 Irritable bowel syndrome with diarrhea: Secondary | ICD-10-CM | POA: Diagnosis present

## 2023-03-28 DIAGNOSIS — I2510 Atherosclerotic heart disease of native coronary artery without angina pectoris: Secondary | ICD-10-CM

## 2023-03-28 DIAGNOSIS — G4733 Obstructive sleep apnea (adult) (pediatric): Secondary | ICD-10-CM | POA: Diagnosis not present

## 2023-03-28 DIAGNOSIS — I5033 Acute on chronic diastolic (congestive) heart failure: Secondary | ICD-10-CM | POA: Diagnosis present

## 2023-03-28 DIAGNOSIS — Z96611 Presence of right artificial shoulder joint: Secondary | ICD-10-CM | POA: Diagnosis present

## 2023-03-28 DIAGNOSIS — I5031 Acute diastolic (congestive) heart failure: Secondary | ICD-10-CM | POA: Diagnosis not present

## 2023-03-28 DIAGNOSIS — G2581 Restless legs syndrome: Secondary | ICD-10-CM | POA: Diagnosis present

## 2023-03-28 DIAGNOSIS — Z833 Family history of diabetes mellitus: Secondary | ICD-10-CM

## 2023-03-28 DIAGNOSIS — I509 Heart failure, unspecified: Secondary | ICD-10-CM | POA: Diagnosis not present

## 2023-03-28 DIAGNOSIS — F32A Depression, unspecified: Secondary | ICD-10-CM | POA: Diagnosis present

## 2023-03-28 DIAGNOSIS — Z96653 Presence of artificial knee joint, bilateral: Secondary | ICD-10-CM | POA: Diagnosis present

## 2023-03-28 DIAGNOSIS — D509 Iron deficiency anemia, unspecified: Secondary | ICD-10-CM | POA: Diagnosis present

## 2023-03-28 DIAGNOSIS — K219 Gastro-esophageal reflux disease without esophagitis: Secondary | ICD-10-CM | POA: Diagnosis present

## 2023-03-28 DIAGNOSIS — M19011 Primary osteoarthritis, right shoulder: Secondary | ICD-10-CM | POA: Diagnosis present

## 2023-03-28 DIAGNOSIS — E279 Disorder of adrenal gland, unspecified: Secondary | ICD-10-CM | POA: Diagnosis not present

## 2023-03-28 DIAGNOSIS — R1011 Right upper quadrant pain: Secondary | ICD-10-CM | POA: Diagnosis not present

## 2023-03-28 DIAGNOSIS — G8929 Other chronic pain: Secondary | ICD-10-CM | POA: Diagnosis present

## 2023-03-28 DIAGNOSIS — Z8262 Family history of osteoporosis: Secondary | ICD-10-CM

## 2023-03-28 DIAGNOSIS — Z818 Family history of other mental and behavioral disorders: Secondary | ICD-10-CM

## 2023-03-28 DIAGNOSIS — Z713 Dietary counseling and surveillance: Secondary | ICD-10-CM

## 2023-03-28 DIAGNOSIS — E785 Hyperlipidemia, unspecified: Secondary | ICD-10-CM | POA: Diagnosis not present

## 2023-03-28 DIAGNOSIS — E1149 Type 2 diabetes mellitus with other diabetic neurological complication: Secondary | ICD-10-CM | POA: Diagnosis present

## 2023-03-28 DIAGNOSIS — K8681 Exocrine pancreatic insufficiency: Secondary | ICD-10-CM | POA: Diagnosis not present

## 2023-03-28 DIAGNOSIS — R0989 Other specified symptoms and signs involving the circulatory and respiratory systems: Secondary | ICD-10-CM | POA: Diagnosis not present

## 2023-03-28 DIAGNOSIS — N281 Cyst of kidney, acquired: Secondary | ICD-10-CM | POA: Diagnosis not present

## 2023-03-28 DIAGNOSIS — Z79899 Other long term (current) drug therapy: Secondary | ICD-10-CM

## 2023-03-28 DIAGNOSIS — Z23 Encounter for immunization: Secondary | ICD-10-CM

## 2023-03-28 DIAGNOSIS — Z8349 Family history of other endocrine, nutritional and metabolic diseases: Secondary | ICD-10-CM

## 2023-03-28 DIAGNOSIS — K625 Hemorrhage of anus and rectum: Secondary | ICD-10-CM | POA: Diagnosis present

## 2023-03-28 DIAGNOSIS — R109 Unspecified abdominal pain: Secondary | ICD-10-CM | POA: Diagnosis not present

## 2023-03-28 DIAGNOSIS — E876 Hypokalemia: Secondary | ICD-10-CM | POA: Diagnosis not present

## 2023-03-28 DIAGNOSIS — Z7951 Long term (current) use of inhaled steroids: Secondary | ICD-10-CM

## 2023-03-28 DIAGNOSIS — I110 Hypertensive heart disease with heart failure: Secondary | ICD-10-CM | POA: Diagnosis not present

## 2023-03-28 DIAGNOSIS — N1831 Chronic kidney disease, stage 3a: Secondary | ICD-10-CM | POA: Diagnosis present

## 2023-03-28 DIAGNOSIS — M25511 Pain in right shoulder: Secondary | ICD-10-CM | POA: Diagnosis present

## 2023-03-28 DIAGNOSIS — R918 Other nonspecific abnormal finding of lung field: Secondary | ICD-10-CM | POA: Diagnosis not present

## 2023-03-28 DIAGNOSIS — Z9049 Acquired absence of other specified parts of digestive tract: Secondary | ICD-10-CM

## 2023-03-28 DIAGNOSIS — I25118 Atherosclerotic heart disease of native coronary artery with other forms of angina pectoris: Secondary | ICD-10-CM

## 2023-03-28 DIAGNOSIS — I10 Essential (primary) hypertension: Secondary | ICD-10-CM | POA: Diagnosis not present

## 2023-03-28 DIAGNOSIS — Z955 Presence of coronary angioplasty implant and graft: Secondary | ICD-10-CM

## 2023-03-28 DIAGNOSIS — R5381 Other malaise: Secondary | ICD-10-CM | POA: Diagnosis present

## 2023-03-28 DIAGNOSIS — R935 Abnormal findings on diagnostic imaging of other abdominal regions, including retroperitoneum: Secondary | ICD-10-CM | POA: Diagnosis not present

## 2023-03-28 DIAGNOSIS — Z7984 Long term (current) use of oral hypoglycemic drugs: Secondary | ICD-10-CM

## 2023-03-28 DIAGNOSIS — Z8719 Personal history of other diseases of the digestive system: Secondary | ICD-10-CM

## 2023-03-28 DIAGNOSIS — B0229 Other postherpetic nervous system involvement: Secondary | ICD-10-CM | POA: Diagnosis present

## 2023-03-28 DIAGNOSIS — R0602 Shortness of breath: Secondary | ICD-10-CM | POA: Diagnosis not present

## 2023-03-28 DIAGNOSIS — E1122 Type 2 diabetes mellitus with diabetic chronic kidney disease: Secondary | ICD-10-CM | POA: Diagnosis present

## 2023-03-28 DIAGNOSIS — Z6841 Body Mass Index (BMI) 40.0 and over, adult: Secondary | ICD-10-CM | POA: Diagnosis not present

## 2023-03-28 DIAGNOSIS — J45909 Unspecified asthma, uncomplicated: Secondary | ICD-10-CM | POA: Diagnosis present

## 2023-03-28 DIAGNOSIS — I5032 Chronic diastolic (congestive) heart failure: Secondary | ICD-10-CM | POA: Diagnosis present

## 2023-03-28 DIAGNOSIS — Z90710 Acquired absence of both cervix and uterus: Secondary | ICD-10-CM

## 2023-03-28 DIAGNOSIS — Z7182 Exercise counseling: Secondary | ICD-10-CM

## 2023-03-28 DIAGNOSIS — Z8042 Family history of malignant neoplasm of prostate: Secondary | ICD-10-CM

## 2023-03-28 DIAGNOSIS — Z9884 Bariatric surgery status: Secondary | ICD-10-CM

## 2023-03-28 DIAGNOSIS — Z90722 Acquired absence of ovaries, bilateral: Secondary | ICD-10-CM

## 2023-03-28 DIAGNOSIS — I472 Ventricular tachycardia, unspecified: Secondary | ICD-10-CM | POA: Diagnosis present

## 2023-03-28 DIAGNOSIS — Z881 Allergy status to other antibiotic agents status: Secondary | ICD-10-CM

## 2023-03-28 DIAGNOSIS — I130 Hypertensive heart and chronic kidney disease with heart failure and stage 1 through stage 4 chronic kidney disease, or unspecified chronic kidney disease: Secondary | ICD-10-CM | POA: Diagnosis not present

## 2023-03-28 DIAGNOSIS — I493 Ventricular premature depolarization: Secondary | ICD-10-CM | POA: Diagnosis present

## 2023-03-28 DIAGNOSIS — Z9889 Other specified postprocedural states: Secondary | ICD-10-CM

## 2023-03-28 DIAGNOSIS — Z8249 Family history of ischemic heart disease and other diseases of the circulatory system: Secondary | ICD-10-CM

## 2023-03-28 DIAGNOSIS — Z823 Family history of stroke: Secondary | ICD-10-CM

## 2023-03-28 DIAGNOSIS — F419 Anxiety disorder, unspecified: Secondary | ICD-10-CM | POA: Diagnosis present

## 2023-03-28 DIAGNOSIS — K648 Other hemorrhoids: Secondary | ICD-10-CM | POA: Diagnosis present

## 2023-03-28 DIAGNOSIS — Z885 Allergy status to narcotic agent status: Secondary | ICD-10-CM

## 2023-03-28 LAB — CBC
HCT: 41.7 % (ref 36.0–46.0)
Hemoglobin: 12.8 g/dL (ref 12.0–15.0)
MCH: 29.6 pg (ref 26.0–34.0)
MCHC: 30.7 g/dL (ref 30.0–36.0)
MCV: 96.5 fL (ref 80.0–100.0)
Platelets: 280 10*3/uL (ref 150–400)
RBC: 4.32 MIL/uL (ref 3.87–5.11)
RDW: 15.1 % (ref 11.5–15.5)
WBC: 11.2 10*3/uL — ABNORMAL HIGH (ref 4.0–10.5)
nRBC: 0 % (ref 0.0–0.2)

## 2023-03-28 LAB — TYPE AND SCREEN
ABO/RH(D): A POS
Antibody Screen: NEGATIVE

## 2023-03-28 LAB — COMPREHENSIVE METABOLIC PANEL
ALT: 16 U/L (ref 0–44)
AST: 19 U/L (ref 15–41)
Albumin: 3.6 g/dL (ref 3.5–5.0)
Alkaline Phosphatase: 125 U/L (ref 38–126)
Anion gap: 14 (ref 5–15)
BUN: 8 mg/dL (ref 8–23)
CO2: 22 mmol/L (ref 22–32)
Calcium: 8.5 mg/dL — ABNORMAL LOW (ref 8.9–10.3)
Chloride: 106 mmol/L (ref 98–111)
Creatinine, Ser: 1.03 mg/dL — ABNORMAL HIGH (ref 0.44–1.00)
GFR, Estimated: 56 mL/min — ABNORMAL LOW (ref >60)
Glucose, Bld: 102 mg/dL — ABNORMAL HIGH (ref 70–99)
Potassium: 3 mmol/L — ABNORMAL LOW (ref 3.5–5.1)
Sodium: 142 mmol/L (ref 135–145)
Total Bilirubin: 1 mg/dL (ref 0.3–1.2)
Total Protein: 7.3 g/dL (ref 6.5–8.1)

## 2023-03-28 LAB — CBG MONITORING, ED: Glucose-Capillary: 150 mg/dL — ABNORMAL HIGH (ref 70–99)

## 2023-03-28 LAB — GLUCOSE, CAPILLARY: Glucose-Capillary: 146 mg/dL — ABNORMAL HIGH (ref 70–99)

## 2023-03-28 LAB — BRAIN NATRIURETIC PEPTIDE: B Natriuretic Peptide: 170 pg/mL — ABNORMAL HIGH (ref 0.0–100.0)

## 2023-03-28 MED ORDER — POTASSIUM CHLORIDE CRYS ER 20 MEQ PO TBCR
40.0000 meq | EXTENDED_RELEASE_TABLET | Freq: Once | ORAL | Status: AC
  Administered 2023-03-28: 40 meq via ORAL
  Filled 2023-03-28: qty 2

## 2023-03-28 MED ORDER — SUCRALFATE 1 G PO TABS
1.0000 g | ORAL_TABLET | Freq: Three times a day (TID) | ORAL | Status: DC
  Administered 2023-03-29 – 2023-04-01 (×11): 1 g via ORAL
  Filled 2023-03-28 (×11): qty 1

## 2023-03-28 MED ORDER — SODIUM CHLORIDE 0.9% FLUSH
3.0000 mL | Freq: Two times a day (BID) | INTRAVENOUS | Status: DC
  Administered 2023-03-28 – 2023-04-01 (×8): 3 mL via INTRAVENOUS

## 2023-03-28 MED ORDER — POLYVINYL ALCOHOL 1.4 % OP SOLN: 1.0000 [drp] | Freq: Four times a day (QID) | OPHTHALMIC | Status: DC | PRN

## 2023-03-28 MED ORDER — FUROSEMIDE 10 MG/ML IJ SOLN
40.0000 mg | Freq: Once | INTRAMUSCULAR | Status: AC
  Administered 2023-03-28: 40 mg via INTRAVENOUS
  Filled 2023-03-28: qty 4

## 2023-03-28 MED ORDER — ATORVASTATIN CALCIUM 80 MG PO TABS
80.0000 mg | ORAL_TABLET | Freq: Every day | ORAL | Status: DC
  Administered 2023-03-29 – 2023-04-01 (×4): 80 mg via ORAL
  Filled 2023-03-28 (×4): qty 1

## 2023-03-28 MED ORDER — SODIUM CHLORIDE 0.9 % IV SOLN: 250.0000 mL | INTRAVENOUS | Status: DC | PRN

## 2023-03-28 MED ORDER — PREGABALIN 50 MG PO CAPS
50.0000 mg | ORAL_CAPSULE | Freq: Two times a day (BID) | ORAL | Status: DC
  Administered 2023-03-28 – 2023-04-01 (×8): 50 mg via ORAL
  Filled 2023-03-28 (×8): qty 1

## 2023-03-28 MED ORDER — MOMETASONE FURO-FORMOTEROL FUM 200-5 MCG/ACT IN AERO
2.0000 | INHALATION_SPRAY | Freq: Two times a day (BID) | RESPIRATORY_TRACT | Status: DC
  Administered 2023-03-29 – 2023-04-01 (×7): 2 via RESPIRATORY_TRACT
  Filled 2023-03-28 (×2): qty 8.8

## 2023-03-28 MED ORDER — CELECOXIB 100 MG PO CAPS
100.0000 mg | ORAL_CAPSULE | Freq: Every day | ORAL | Status: DC
  Administered 2023-03-29 – 2023-04-01 (×4): 100 mg via ORAL
  Filled 2023-03-28 (×4): qty 1

## 2023-03-28 MED ORDER — PANCRELIPASE (LIP-PROT-AMYL) 36000-114000 UNITS PO CPEP: 36000.0000 [IU] | ORAL_CAPSULE | ORAL | Status: DC | PRN

## 2023-03-28 MED ORDER — PANCRELIPASE (LIP-PROT-AMYL) 36000-114000 UNITS PO CPEP
72000.0000 [IU] | ORAL_CAPSULE | Freq: Three times a day (TID) | ORAL | Status: DC
  Administered 2023-03-29 – 2023-04-01 (×11): 72000 [IU] via ORAL
  Filled 2023-03-28 (×12): qty 2

## 2023-03-28 MED ORDER — INSULIN GLARGINE-YFGN 100 UNIT/ML ~~LOC~~ SOLN
6.0000 [IU] | Freq: Every day | SUBCUTANEOUS | Status: DC
  Administered 2023-03-28 – 2023-03-31 (×4): 6 [IU] via SUBCUTANEOUS
  Filled 2023-03-28 (×6): qty 0.06

## 2023-03-28 MED ORDER — DULOXETINE HCL 30 MG PO CPEP
30.0000 mg | ORAL_CAPSULE | Freq: Every day | ORAL | Status: DC
  Administered 2023-03-29 – 2023-04-01 (×4): 30 mg via ORAL
  Filled 2023-03-28 (×4): qty 1

## 2023-03-28 MED ORDER — APIXABAN 5 MG PO TABS
5.0000 mg | ORAL_TABLET | Freq: Two times a day (BID) | ORAL | Status: DC
  Administered 2023-03-28 – 2023-03-30 (×4): 5 mg via ORAL
  Filled 2023-03-28 (×4): qty 1

## 2023-03-28 MED ORDER — FUROSEMIDE 10 MG/ML IJ SOLN
40.0000 mg | Freq: Two times a day (BID) | INTRAMUSCULAR | Status: DC
  Administered 2023-03-29 – 2023-03-30 (×4): 40 mg via INTRAVENOUS
  Filled 2023-03-28 (×4): qty 4

## 2023-03-28 MED ORDER — INSULIN ASPART 100 UNIT/ML IJ SOLN
0.0000 [IU] | Freq: Three times a day (TID) | INTRAMUSCULAR | Status: DC
  Administered 2023-03-28: 2 [IU] via SUBCUTANEOUS
  Administered 2023-03-29: 3 [IU] via SUBCUTANEOUS
  Administered 2023-03-29 – 2023-03-30 (×2): 5 [IU] via SUBCUTANEOUS
  Administered 2023-03-30: 3 [IU] via SUBCUTANEOUS
  Administered 2023-03-30 – 2023-03-31 (×2): 5 [IU] via SUBCUTANEOUS
  Administered 2023-03-31 (×2): 3 [IU] via SUBCUTANEOUS
  Administered 2023-04-01 (×2): 5 [IU] via SUBCUTANEOUS
  Filled 2023-03-28 (×11): qty 1

## 2023-03-28 MED ORDER — ONDANSETRON HCL 4 MG/2ML IJ SOLN: 4.0000 mg | Freq: Four times a day (QID) | INTRAMUSCULAR | Status: DC | PRN

## 2023-03-28 MED ORDER — SODIUM CHLORIDE 0.9% FLUSH: 3.0000 mL | INTRAVENOUS | Status: DC | PRN

## 2023-03-28 MED ORDER — EZETIMIBE 10 MG PO TABS
10.0000 mg | ORAL_TABLET | Freq: Every day | ORAL | Status: DC
  Administered 2023-03-29 – 2023-04-01 (×4): 10 mg via ORAL
  Filled 2023-03-28 (×4): qty 1

## 2023-03-28 MED ORDER — PANTOPRAZOLE SODIUM 40 MG PO TBEC
40.0000 mg | DELAYED_RELEASE_TABLET | Freq: Every day | ORAL | Status: DC
  Administered 2023-03-29 – 2023-04-01 (×4): 40 mg via ORAL
  Filled 2023-03-28 (×4): qty 1

## 2023-03-28 MED ORDER — POTASSIUM CHLORIDE 10 MEQ/100ML IV SOLN
10.0000 meq | INTRAVENOUS | Status: DC
  Filled 2023-03-28: qty 100

## 2023-03-28 MED ORDER — INFLUENZA VAC A&B SURF ANT ADJ 0.5 ML IM SUSY
0.5000 mL | PREFILLED_SYRINGE | INTRAMUSCULAR | Status: AC
  Administered 2023-03-29: 0.5 mL via INTRAMUSCULAR
  Filled 2023-03-28: qty 0.5

## 2023-03-28 MED ORDER — LOPERAMIDE HCL 2 MG PO CAPS: 4.0000 mg | ORAL_CAPSULE | ORAL | Status: DC | PRN

## 2023-03-28 MED ORDER — ACETAMINOPHEN 325 MG PO TABS: 650.0000 mg | ORAL_TABLET | ORAL | Status: DC | PRN

## 2023-03-28 NOTE — ED Provider Notes (Signed)
[facility] Provider Note    Event Date/Time   First MD Initiated Contact with Patient 03/28/23 1343     (approximate)   History   Leg Swelling and Rectal Bleeding   HPI  Denise Sharp is a 77 year old female with history of CAD, CHF, recently diagnosed A-fib, T2DM, HTN, IBS, OSA presenting to the emergency department for evaluation of shortness of breath and rectal bleeding.  She was seen her cardiology office earlier today.  There she reported progressive shortness of breath with weight gain and lower extremity swelling.  Also reports exertional dyspnea.  She also reported a week of rectal bleeding described as bright red blood in her toilet bowl and with wiping that she thinks may be coming from hemorrhoids.  In the cardiology office, they were concern for decompensated heart failure and directed the patient to the ER for anticipated admission for diuresis.  She is on Eliquis for her atrial fibrillation.     Physical Exam   Triage Vital Signs: ED Triage Vitals  Encounter Vitals Group     BP 03/28/23 1206 (!) 142/64     Systolic BP Percentile --      Diastolic BP Percentile --      Pulse Rate 03/28/23 1206 65     Resp 03/28/23 1206 18     Temp 03/28/23 1206 97.9 F (36.6 C)     Temp Source 03/28/23 1206 Oral     SpO2 03/28/23 1206 96 %     Weight 03/28/23 1207 268 lb 12.8 oz (121.9 kg)     Height 03/28/23 1207 5\' 4"  (1.626 m)     Head Circumference --      Peak Flow --      Pain Score 03/28/23 1206 0     Pain Loc --      Pain Education --      Exclude from Growth Chart --     Most recent vital signs: Vitals:   03/28/23 1206  BP: (!) 142/64  Pulse: 65  Resp: 18  Temp: 97.9 F (36.6 C)  SpO2: 96%     General: Awake, interactive  CV:  Regular rate, good peripheral perfusion.  Resp:  Sounds coarse bilaterally with bilateral crackles, somewhat labored respirations Abd:  Soft, nondistended, nontender to palpation, external hemorrhoids  visible, not obviously bleeding on exam.  On rectal exam and in diaper, with moderate to large amount of brown stool noted with a very small amount of overlying mucousy red blood on rectal exam.  Hemoccult positive. Neuro:  Symmetric facial movement, fluid speech MSK:  Bilateral lower extremity pitting edema   ED Results / Procedures / Treatments   Labs (all labs ordered are listed, but only abnormal results are displayed) Labs Reviewed  COMPREHENSIVE METABOLIC PANEL - Abnormal; Notable for the following components:      Result Value   Potassium 3.0 (*)    Glucose, Bld 102 (*)    Creatinine, Ser 1.03 (*)    Calcium 8.5 (*)    GFR, Estimated 56 (*)    All other components within normal limits  CBC - Abnormal; Notable for the following components:   WBC 11.2 (*)    All other components within normal limits  BRAIN NATRIURETIC PEPTIDE  TYPE AND SCREEN     EKG EKG independently reviewed interpreted by myself (ER attending) demonstrates:    RADIOLOGY Imaging independently reviewed and interpreted by myself demonstrates:  X-Danetta Prom with evidence of pulmonary edema on my  review  PROCEDURES:  Critical Care performed: Yes, see critical care procedure note(s)  CRITICAL CARE Performed by: Trinna Post   Total critical care time: 32 minutes  Critical care time was exclusive of separately billable procedures and treating other patients.  Critical care was necessary to treat or prevent imminent or life-threatening deterioration.  Critical care was time spent personally by me on the following activities: development of treatment plan with patient and/or surrogate as well as nursing, discussions with consultants, evaluation of patient's response to treatment, examination of patient, obtaining history from patient or surrogate, ordering and performing treatments and interventions, ordering and review of laboratory studies, ordering and review of radiographic studies, pulse oximetry and  re-evaluation of patient's condition.   Procedures   MEDICATIONS ORDERED IN ED: Medications  furosemide (LASIX) injection 40 mg (has no administration in time range)  potassium chloride SA (KLOR-CON M) CR tablet 40 mEq (has no administration in time range)  potassium chloride 10 mEq in 100 mL IVPB (has no administration in time range)     IMPRESSION / MDM / ASSESSMENT AND PLAN / ED COURSE  I reviewed the triage vital signs and the nursing notes.  Differential diagnosis includes, but is not limited to, CHF exacerbation, pneumonia, deconditioning, hemorrhoidal bleed, other lower GI bleed  Patient's presentation is most consistent with acute presentation with potential threat to life or bodily function.  77 year old female presenting to the emergency department for evaluation of shortness of breath and rectal bleeding.  Clinically with signs of volume overload with lower extremity swelling, exertional dyspnea, x-Deshannon Hinchliffe with signs of pulmonary edema.  She was seen by cardiology earlier today who did recommend inpatient management.  Ordered for a dose of IV Lasix.  Is hypokalemic so ordered for oral and IV repletion.  Regarding her rectal bleeding, suspect that this is likely related to her hemorrhoids given visible hemorrhoids on exam, bright red appearance, otherwise brown stool.  Stable hemoglobin here.  Will reach out to hospitalist team to discuss admission.      FINAL CLINICAL IMPRESSION(S) / ED DIAGNOSES   Final diagnoses:  Acute on chronic congestive heart failure, unspecified heart failure type (HCC)  Bright red blood per rectum     Rx / DC Orders   ED Discharge Orders     None        Note:  This document was prepared using Dragon voice recognition software and may include unintentional dictation errors.   Trinna Post, MD 03/28/23 [phone]

## 2023-03-28 NOTE — Assessment & Plan Note (Signed)
History of iron deficiency anemia, with recent infusion of Feraheme 2 days ago.  Iron panel obtained on 9/10 with normalized iron levels.

## 2023-03-28 NOTE — Assessment & Plan Note (Signed)
History of relatively well-controlled type 2 diabetes with A1c around 7.5% for the last year.  Currently on Farxiga, Humalog, Tresiba  - SSI, moderate - Semglee 6 units at bedtime

## 2023-03-28 NOTE — Assessment & Plan Note (Signed)
Per chart review, patient has a history of HFpEF with last EF of 65-70% on echocardiogram in 2021.  RHC/LHC in November 2023 did not demonstrate any obstructive CAD.  Symptoms have been gradually worsening over the last month with significant hypervolemia on examination.  - Telemetry monitoring - Obtain echocardiogram since it has been > 1 year - Lasix 40 mg IV twice daily - Daily weights - Strict in and out - Daily BMP and magnesium - Aggressive potassium replacement

## 2023-03-28 NOTE — Progress Notes (Signed)
Cardiology Office Note:  .   Date:  03/28/2023  ID:  Denise Sharp, DOB [birth year], MRN [id number] PCP: Sherlene Shams, MD   [facility] Prov[facility] Cardiologist:  [name], MD     History of Present Illness: .   Denise Sharp is a 77 y.o. female with history of CAD status post PCI to the ostial and mid LAD in the setting of unstable angina in 12/2019, chronic HFpEF, recently diagnosed atrial fibrillation, hypertension, type 2 diabetes mellitus, iron deficiency anemia following gastric bypass x2, irritable bowel syndrome, esophageal stenosis, chronic dizziness with gait instability, cervicogenic headache with cervical spine stenosis, obstructive sleep apnea on CPAP, restless leg syndrome, and adrenal mass who presents for follow-up of coronary artery disease, HFpEF, and atrial fibrillation.  She was last seen in our office in mid July by Terrilee Croak, [place], at which time she reported stable chronic fatigue and atypical chest pain.  She was scheduled for EGD, which was performed last month.  Esophageal dilation was performed as part of this.  Today, Denise Sharp has multiple complaints.  She has noticed progressive worsening in her shortness of breath accompanied by wheezing over the last month.  She has also noticed a significant weight gain with this.  She is no longer able to lie flat or even tolerate her CPAP due to dyspnea.  She has had considerable leg swelling as well as abdominal distention with early satiety.  She notes intermittent chest pains as well, which are relatively stable.  Beginning last week, she also experienced rectal bleeding.  She has noticed this in the past from time to time with her hemorrhoids, though this seemed more profuse than she is accustomed to.  She has continued to have some intermittent bleeding since then, including this morning.  She reports being compliant with her medications, including furosemide 20 mg daily.  She notes that she was recently  diagnosed with pancreatic insufficiency and started on Creon.  She says that she has been having lots of side effects from this but does not elaborate.  ROS: See HPI  Studies Reviewed: [name]   EKG Interpretation Date/Time:  Thursday March 28 2023 10:44:00 EDT Ventricular Rate:  64 PR Interval:  166 QRS Duration:  70 QT Interval:  472 QTC Calculation: 486 R Axis:   24  Text Interpretation: Sinus rhythm with Premature supraventricular complexes Low voltage QRS Nonspecific T wave abnormality Prolonged QT When compared with ECG of 28-Jan-2023 14:55, Premature ventricular complexes are no longer Present Nonspecific T wave abnormality now evident in Anterolateral leads Confirmed by Bellamia Ferch, Cristal Deer [phone]) on 03/28/2023 11:36:24 AM    Risk Assessment/Calculations:    CHA2DS2-VASc Score = 7   This indicates a 11.2% annual risk of stroke. The patient's score is based upon: CHF History: 1 HTN History: 1 Diabetes History: 1 Stroke History: 0 Vascular Disease History: 1 Age Score: 2 Gender Score: 1            Physical Exam:   VS:  BP 132/76 (BP Location: Left Arm, Patient Position: Sitting, Cuff Size: Normal)   Pulse 64   Ht 5' 4.5" (1.638 m)   Wt 263 lb (119.3 kg)   SpO2 96%   BMI 44.45 kg/m    Wt Readings from Last 3 Encounters:  03/28/23 263 lb (119.3 kg)  03/26/23 269 lb 6.4 oz (122.2 kg)  03/21/23 271 lb 9.6 oz (123.2 kg)    General:  NAD.  Accompanied by her husband. Neck: JVP approximately 8  cm with positive HJR, though body habitus limits evaluation. Lungs: Mildly diminished breath sounds throughout with faint bibasilar crackles. Heart: Regular rate and rhythm with occasional extrasystoles.  No murmurs. Abdomen: Firm and mildly distended. Extremities: 1+ nonpitting edema both lower extremities.  ASSESSMENT AND PLAN: .    Acute on chronic HFpEF: Denise Sharp has signs and symptoms of decompensated heart failure.  She has gained about 30 pounds over the last 2 to 3  months.  Labs 2 days ago were also notable for elevated BNP.  We have discussed outpatient management, but Denise Sharp is very concerned about her ability to reach the bathroom without assistance with diuresis.  Her husband was also recently diagnosed with metastatic prostate cancer and has a hard time helping her.  We have agreed to refer her to the ED for further evaluation and admission for diuresis and optimization of her heart failure.  Repeat echocardiogram should also be obtained.  Rectal bleeding: This has been an intermittent problem that Ms. Garrette has attributed to hemorrhoids.  However, bleeding seems to be more pronounced recently.  She remains on apixaban, which may need to be held if there is evidence of further hemoglobin drop or active bleeding.  Coronary artery disease with stable angina: Denise Sharp continues to have intermittent chest pain, which has been longstanding.  I suspect that acute heart failure exacerbation is not helping this.  I favor deferring ischemia evaluation unless she has worsening chest pain despite improvement in her heart failure or there are objective signs of ongoing myocardial ischemia.  Persistent atrial fibrillation: EKG today demonstrates sinus rhythm with frequent PACs.  Continue current dose of metoprolol.  If possible, apixaban should be continued though if she is having significant rectal bleeding, this will need to be held until bleeding resolves and underlying source has been addressed.    Dispo: Refer to ED for further evaluation and admission.  Signed, [name], MD

## 2023-03-28 NOTE — Assessment & Plan Note (Addendum)
Patient has known intermittent chest pain which is relatively stable, and she follows up with cardiology.  LHC in November 2023 did not demonstrate any obstructive lesions.  - Continue home regimen

## 2023-03-28 NOTE — Assessment & Plan Note (Signed)
Over the last year, GFR has been primarily in the range of CKD stage IIIa.  Currently improved compared to baseline.  - Daily BMP while IV diuresing - Avoid other nephrotoxic agents

## 2023-03-28 NOTE — Assessment & Plan Note (Addendum)
Patient reporting approximately 1 week of bright red blood per rectum in the setting of known hemorrhoidal disease. Given bleeding has occurred independently of bowel movements, likely hemorrhoids. Differential also includes diverticulosis but less likely.   - Apply rectal suppository twice daily

## 2023-03-28 NOTE — Assessment & Plan Note (Addendum)
-   Continue home Eliquis given rectal bleeding is likely hemorrhoidal - Hold home metoprolol in the setting of bradycardia

## 2023-03-28 NOTE — Progress Notes (Signed)
Patient taken to ED due to HFpEF, Chest pain, shortness of breath, rectal bleeding and weight gain of 30 pounds. Accompanied by Gustavus Bryant RN. Ra[facility] ED Charge Nurse notified of patient coming.

## 2023-03-28 NOTE — Assessment & Plan Note (Signed)
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  She has been started on Creon therapy.  Unfortunately, EPI secondary to type 2 diabetes does not generally improve with Creon therapy.  - Continue home medications

## 2023-03-28 NOTE — H&P (Signed)
History and Physical    Patient: Denise Sharp:[id number] DOB: [birth year] DOA: 03/28/2023 DOS: the patient was seen and examined on 03/28/2023 PCP: Sherlene Shams, MD  Patient coming from: Home  Chief Complaint:  Chief Complaint  Patient presents with   Leg Swelling   Rectal Bleeding   HPI: Denise Sharp is a 77 y.o. female with medical history significant of CAD s/p PCI to ostial and mid LAD (2021), HFpEF, atrial fibrillation on Eliquis, CKD stage IIIa, type 2 diabetes, iron deficiency anemia on IV iron infusions, gastric bypass x 2, esophageal stenosis s/p dilation, chronic dizziness with gait instability, cervicogenic headache, OSA on CPAP, restless leg syndrome, who presents to the ED due to leg swelling and rectal bleeding  Denise Sharp states that over the last 1 month, she has been slowly gaining weight despite eating much less.  Her abdomen feels significantly distended.  She has has been experience also seeing creasing lower extremity edema, orthopnea, and dyspnea on exertion.  She is taking her Lasix 20 mg daily as prescribed without improvement.  She denies any chest pain or palpitations at this time.  She notes that in May, she weighed approximately 220 pounds and now she is 270.  Denise Sharp also endorses bright red blood per rectum that is been occurring for the last week.  She states that she finds bright red blood on her depends even when she has not had a bowel movement.  When she was walking to the toilet, she noted that she was having little droplets of blood and then there was blood in the commode as well.  She is uncertain if there is blood mixed into her stool.  She notes a history of hemorrhoids.  She endorses some abdominal cramps intermittently but no abdominal pain.  Denise Sharp notes that she was recently started on Creon.   ED course: On arrival to the ED, patient was hypertensive at 142/64 with heart rate of 65.  She was saturating at 96% on room  air.  She was afebrile at 97.9.  Initial workup notable for WBC of 11.2, potassium 3.0, BUN 8, creatinine 0.03 with GFR 56.  Chest x-ray was obtained with results pending.  BNP pending.  Patient started on IV Lasix and [facility] contacted for admission.  Review of Systems: As mentioned in the history of present illness. All other systems reviewed and are negative.  Past Medical History:  Diagnosis Date   (HFpEF) heart failure with preserved ejection fraction (HCC)    a. 12/2019 Echo: EF 65-70%, Gr2 DD. No significant valvular dzs.   Abnormality of gait 03/25/2013   Adrenal mass, left (HCC)    Anginal pain (HCC)    Anxiety    Aortic atherosclerosis (HCC)    Arthritis    Asthma    CAD (coronary artery disease) with h/o Atypical Chest Pain    a. 04/1986 Cath ([facility]): nl cors, EF 65%; b. 07/2012 Cath [facility]): Diff minor irregs; c. 07/2016 MV [facility]): "Equivocal"; d. 08/2016 Cardiac CT Ca2+ score [facility]): Ca2+ 1548; e. 05/2019 MV: No ischemia. EF 75%; f. 12/2019 PCI: LM nl, LAD 65ost (3.5x12 Resolute DES), 42m (2.75x12 Resolute DES),LCX/RCA nl; g. 08/2020 Cath: patent LAD stents, RCA 40ost, elev RH pressures, EF >65%.   Cervical spinal stenosis 1994   due to trauma to back ([employer] accident), has intermittent paralysis and parasthesias   Cervicogenic headache 03/23/2014   CKD (chronic kidney disease), stage III (HCC)    Depression    Diverticulosis  Dizziness    a.) chronic   DJD (degenerative joint disease)    a. Chronic R shoulder pain   Dyspnea    Esophageal stenosis 09/2009   a.) transient outlet obstruction by food, cleared by EGD   Family history of adverse reaction to anesthesia    a.) daughter with (+) PONV   Gastric bypass status for obesity    GERD (gastroesophageal reflux disease)    Headache(784.0)    HLD (hyperlipidemia)    Hypertension    IBS (irritable bowel syndrome)    IDA (iron deficiency anemia)    a.) post 2 unit txfsn 2009, normal endo/colonoscopy by [facility]   Left bundle branch  block (LBBB)    a.) Intermittently present - likely rate related.   NSVT (nonsustained ventricular tachycardia) (HCC) 11/15/2020   a.) Holter study 11/15/2020; 11 episodes of NSVT lasting up to 8 beats with a maximum rate of 210 bpm; 14 atrial runs lasting up to 18 beats with a rate of up to 250 bpm.  Some atrial runs felt to be NSVT.   NSVT (nonsustained ventricular tachycardia) (HCC)    a. 10/2020 [facility]: 11 runs of NSVT up to 8 beats.   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 10/2020 [facility]: 14 atrial runs up to 18 beats, max HR 250.   Restless leg syndrome    Rotator cuff arthropathy, right 08/13/2017   Syncope and collapse 03/12/2014   Type II diabetes mellitus (HCC)    Past Surgical History:  Procedure Laterality Date   APPENDECTOMY     BACK SURGERY     CARDIOVERSION N/A 10/30/2022   Procedure: CARDIOVERSION;  Surgeon: [name], MD;  Location: [facility] ORS;  Service: Cardiovascular;  Laterality: N/A;   CHOLECYSTECTOMY N/A 1976   COLONOSCOPY     CORONARY ANGIOPLASTY     CORONARY STENT INTERVENTION N/A 01/04/2020   Procedure: CORONARY STENT INTERVENTION;  Surgeon: Iran Ouch, MD;  Location: [facility] INVASIVE CV LAB;  Service: Cardiovascular;  Laterality: N/A;  LAD    DIAPHRAGMATIC HERNIA REPAIR  2015   ESOPHAGEAL DILATION     multiple   ESOPHAGOGASTRODUODENOSCOPY (EGD) WITH PROPOFOL N/A 05/11/2021   Procedure: ESOPHAGOGASTRODUODENOSCOPY (EGD) WITH PROPOFOL;  Surgeon: [name], MD;  Location: [facility] ENDOSCOPY;  Service: Endoscopy;  Laterality: N/A;   ESOPHAGOGASTRODUODENOSCOPY (EGD) WITH PROPOFOL N/A 06/13/2021   Procedure: ESOPHAGOGASTRODUODENOSCOPY (EGD) WITH PROPOFOL;  Surgeon: [name], MD;  Location: [facility] ENDOSCOPY;  Service: Endoscopy;  Laterality: N/A;   ESOPHAGOGASTRODUODENOSCOPY (EGD) WITH PROPOFOL N/A 02/28/2023    Procedure: ESOPHAGOGASTRODUODENOSCOPY (EGD) WITH PROPOFOL;  Surgeon: [name], MD;  Location: [facility] ENDOSCOPY;  Service: Endoscopy;  Laterality: N/A;   EYE SURGERY     GASTRIC BYPASS  2000, 2005   Dr. Arlana Pouch IMPLANT PLACEMENT  10/2011   Jeannette How IMPLANT PLACEMENT N/A 09/18/2021   Procedure: Leane Platt IMPLANT FIRST STAGE;  Surgeon: Alfredo Martinez, MD;  Location: [facility] ORS;  Service: Urology;  Laterality: N/A;   INTERSTIM IMPLANT PLACEMENT N/A 09/18/2021   Procedure: Leane Platt IMPLANT SECOND STAGE WITH IMPEDENCE CHECK;  Surgeon: Alfredo Martinez, MD;  Location: [facility] ORS;  Service: Urology;  Laterality: N/A;   INTERSTIM IMPLANT REMOVAL N/A 09/18/2021   Procedure: REMOVAL OF INTERSTIM IMPLANT;  Surgeon: Alfredo Martinez, MD;  Location: [facility] ORS;  Service: Urology;  Laterality: N/A;   LEFT  HEART CATH AND CORONARY ANGIOGRAPHY N/A 04/1986   LEFT HEART CATH AND CORONARY ANGIOGRAPHY Left 07/24/2012   LEFT HEART CATH AND CORS/GRAFTS ANGIOGRAPHY N/A 12/31/2019   Procedure: LEFT HEART CATH AND CORS/GRAFTS ANGIOGRAPHY;  Surgeon: Antonieta Iba, MD;  Location: [facility];  Service: Cardiovascular;  Laterality: N/A;   PANNICULECTOMY N/A 06/16/2019   Procedure: PANNICULECTOMY;  Surgeon: Allena Napoleon, MD;  Location: [facility] OR;  Service: Plastics;  Laterality: N/A;  3 hours, please   REVERSE SHOULDER ARTHROPLASTY Right 08/13/2017   Procedure: REVERSE RIGHT SHOULDER ARTHROPLASTY;  Surgeon: Teryl Lucy, MD;  Location: [facility] OR;  Service: Orthopedics;  Laterality: Right;   RIGHT/LEFT HEART CATH AND CORONARY ANGIOGRAPHY N/A 09/06/2020   Procedure: RIGHT/LEFT HEART CATH AND CORONARY ANGIOGRAPHY;  Surgeon: [name], MD;  Location: [facility];  Service: Cardiovascular;  Laterality: N/A;   RIGHT/LEFT HEART CATH AND CORONARY ANGIOGRAPHY N/A 06/04/2022   Procedure: RIGHT/LEFT HEART CATH AND CORONARY ANGIOGRAPHY;  Surgeon: [name], MD;  Location: [facility]  INVASIVE CV LAB;  Service: Cardiovascular;  Laterality: N/A;   ROTATOR CUFF REPAIR Right    SPINE SURGERY  1995   [place]   TOOTH EXTRACTION  11/13/2021   TOTAL ABDOMINAL HYSTERECTOMY W/ BILATERAL SALPINGOOPHORECTOMY  1974   TOTAL KNEE ARTHROPLASTY Bilateral 2007   Procedure: TOTAL KNEE ARTHROPLASTY; Surgeons: Gerrit Heck, MD and [name], MD   UMBILICAL HERNIA REPAIR  08/11/2015   Social History:  reports that she has never smoked. She has never been exposed to tobacco smoke. She has never used smokeless tobacco. She reports that she does not currently use drugs. She reports that she does not drink alcohol.  Allergies  Allergen Reactions   Demeclocycline Hives   Erythromycin Nausea And Vomiting and Other (See Comments)    Severe irritable bowel   Flagyl [Metronidazole] Nausea And Vomiting and Other (See Comments)    Severe irritable bowel   Glucophage [Metformin Hcl] Nausea And Vomiting and Other (See Comments)    "Sick" "I won't take anything that has metformin in it"   Tetracyclines & Related Hives and Rash   Tetracycline Other (See Comments)   Diovan [Valsartan] Nausea Only        Sulfa Antibiotics Other (See Comments) and Rash    As child   Xanax [Alprazolam] Other (See Comments)    Hyperactivity     Family History  Problem Relation Age of Onset   Heart disease Father    Hypertension Father    Prostate cancer Father    Stroke Father    Osteoporosis Father    Stroke Mother    Depression Mother    Headache Mother    Heart disease Mother    Thyroid disease Mother    Hypertension Mother    Diabetes Daughter    Heart disease Daughter    Hypertension Daughter    Hypertension Son     Prior to Admission medications   Medication Sig Start Date End Date Taking? Authorizing Provider  albuterol (VENTOLIN HFA) 108 (90 Base) MCG/ACT inhaler Inhale 1-2 puffs into the lungs every 6 (six) hours as needed for wheezing or shortness of breath. 11/03/21   McLean-Scocuzza, Pasty Spillers, MD   apixaban (ELIQUIS) 5 MG TABS tablet Take 1 tablet (5 mg total) by mouth 2 (two) times daily. 01/29/23   End, Cristal Deer, MD  atorvastatin (LIPITOR) 80 MG tablet Take 1 tablet (80 mg total) by mouth daily. 11/12/22   Sherlene Shams, MD  benzonatate (TESSALON) 200 MG capsule  Take 1 capsule (200 mg total) by mouth 2 (two) times daily as needed for cough. 07/13/22   Sherlene Shams, MD  budesonide-formoterol (SYMBICORT) 160-4.5 MCG/ACT inhaler Inhale 2 puffs into the lungs 2 (two) times daily. 03/07/23   Coralyn Helling, MD  butalbital-acetaminophen-caffeine (FIORICET) 50-325-40 MG tablet TAKE 1 TABLET BY MOUTH EVERY 6 HOURS AS NEEDED FOR HEADACHE. 07/23/22   Sherlene Shams, MD  CELEBREX 100 MG capsule Take 100 mg by mouth daily. 03/14/23   [provider]  Cholecalciferol (VITAMIN D3) 250 MCG (10000 UT) capsule Take 10,000 Units by mouth daily.    [provider]  clotrimazole-betamethasone (LOTRISONE) cream Apply 1 application topically 2 (two) times daily as needed (irritation).    [provider]  conjugated estrogens (PREMARIN) vaginal cream Apply one pea-sized amount around the opening of the urethra daily for 2 weeks, then 3 times weekly moving forward. 04/20/22   Carman Ching, PA-C  Continuous Blood Gluc Receiver (FREESTYLE LIBRE 2 READER) DEVI Use to check sugar at least TID 09/26/20   Sherlene Shams, MD  cyanocobalamin (VITAMIN B12) 1000 MCG/ML injection Inject 1 mL (1,000 mcg total) into the muscle every 30 (thirty) days. Inject 1 ml (1000 mcg ) IM weekly x 4,  Then monthly thereafter 01/25/23   Sherlene Shams, MD  dapagliflozin propanediol (FARXIGA) 10 MG TABS tablet Take 1 tablet (10 mg total) by mouth daily. 11/12/22   Sherlene Shams, MD  dicyclomine (BENTYL) 20 MG tablet Take 1 tablet (20 mg total) by mouth 3 (three) times daily before meals. 08/07/22   [name], MD  DULoxetine (CYMBALTA) 30 MG capsule Take 1 capsule (30 mg total) by mouth daily. 08/28/22    Glean Salvo, NP  ezetimibe (ZETIA) 10 MG tablet Take 1 tablet (10 mg total) by mouth daily. 01/29/23 04/29/23  End, Cristal Deer, MD  fexofenadine (ALLEGRA) 180 MG tablet Take 180 mg by mouth as needed for allergies or rhinitis.    [provider]  fluticasone (FLONASE) 50 MCG/ACT nasal spray Place 2 sprays into both nostrils daily. Patient taking differently: Place 2 sprays into both nostrils 3 (three) times daily as needed for allergies. 04/11/22   Glori Luis, MD  furosemide (LASIX) 20 MG tablet Take 1 tablet (20 mg total) by mouth daily. 01/29/23 04/29/23  End, Cristal Deer, MD  Glucagon (GVOKE HYPOPEN 2-PACK) 0.5 MG/0.1ML SOAJ Inject 1 auto injector subcutaneously as needed for severe hypoglycemia. May repeat x 1 after 15 minutes if needed. 09/25/22   Bethanie Dicker, NP  glucose blood (ONETOUCH VERIO) test strip Use to check blood sugar twice daily. 02/23/22   Sherlene Shams, MD  insulin degludec (TRESIBA FLEXTOUCH) 100 UNIT/ML FlexTouch Pen Inject 12 Units into the skin daily. 11/12/22   Sherlene Shams, MD  insulin lispro (HUMALOG) 100 UNIT/ML injection Inject 0.08-0.14 mLs (8-14 Units total) into the skin 3 (three) times daily with meals. 03/23/23   Worthy Rancher B, FNP  Insulin Pen Needle (DROPLET PEN NEEDLES) 31G X 5 MM MISC USE ONE NEEDLE SUBCUTANEOUSLY AS DIRECTED (REMOVE AND DISCARD NEEDLE IN SHARPS CONTAINER IMMEDIATELY AFTER USE) 02/12/22   Sherlene Shams, MD  lipase/protease/amylase (CREON) 36000 UNITS CPEP capsule Take 2 capsules (72,000 Units total) by mouth 3 (three) times daily before meals AND 1 capsule (36,000 Units total) with snacks. 03/08/23 03/02/24  Celso Amy, PA-C  loperamide (IMODIUM) 2 MG capsule Take 4-6 mg by mouth 3 (three) times daily before meals.    [provider]  magnesium gluconate (MAGONATE) 500 MG tablet Take 500 mg by mouth daily. Patient not taking: Reported on 03/28/2023    [provider]  metoprolol succinate (TOPROL-XL) 25 MG  24 hr tablet Take 0.5 tablets (12.5 mg total) by mouth daily. Take with or immediately following a meal. 01/29/23   End, Cristal Deer, MD  nitroGLYCERIN (NITROSTAT) 0.4 MG SL tablet Place 1 tablet (0.4 mg total) under the tongue every 5 (five) minutes as needed for chest pain. 08/03/22   End, Cristal Deer, MD  nystatin (MYCOSTATIN/NYSTOP) powder Apply 1 g topically 4 (four) times daily as needed (rash).    [provider]  ondansetron (ZOFRAN-ODT) 4 MG disintegrating tablet Take 1 tablet (4 mg total) by mouth every 8 (eight) hours as needed for nausea or vomiting. 07/13/22   Sherlene Shams, MD  OneTouch Delica Lancets 33G MISC Used to check blood sugar two times a day. 02/22/22   Sherlene Shams, MD  OVER THE COUNTER MEDICATION Apply 1 application  topically daily as needed (pain). [place] Gel topical pain reliever    [provider]  pantoprazole (PROTONIX) 40 MG tablet Take 1 tablet (40 mg total) by mouth daily. 03/04/23 02/27/24  Celso Amy, PA-C  Polyethyl Glycol-Propyl Glycol (SYSTANE OP) Place 1 drop into both eyes 4 (four) times daily as needed (dry eyes).    [provider]  pregabalin (LYRICA) 50 MG capsule Take 1 capsule (50 mg total) by mouth 2 (two) times daily. 01/25/23   Sherlene Shams, MD  sucralfate (CARAFATE) 1 g tablet Take 1 tablet (1 g total) by mouth 3 (three) times daily before meals. 01/07/23 01/02/24  Celso Amy, PA-C  SYRINGE-NEEDLE, DISP, 3 ML (LUER LOCK SAFETY SYRINGES) 25G X 1" 3 ML MISC Use to give b12 injections once monthly 12/27/22   Sherlene Shams, MD    Physical Exam: Vitals:   03/28/23 1206 03/28/23 1207  BP: (!) 142/64   Pulse: 65   Resp: 18   Temp: 97.9 F (36.6 C)   TempSrc: Oral   SpO2: 96%   Weight:  121.9 kg  Height:  5\' 4"  (1.626 m)   Physical Exam Vitals and nursing note reviewed.  Constitutional:      Appearance: She is morbidly obese. She is not ill-appearing or toxic-appearing.  HENT:     Head: Normocephalic and  atraumatic.     Mouth/Throat:     Mouth: Mucous membranes are moist.     Pharynx: Oropharynx is clear.  Eyes:     Conjunctiva/sclera: Conjunctivae normal.     Pupils: Pupils are equal, round, and reactive to light.  Cardiovascular:     Rate and Rhythm: Bradycardia present. Rhythm irregular.     Heart sounds: No murmur heard.    No gallop.  Pulmonary:     Effort: Pulmonary effort is normal.     Breath sounds: Decreased breath sounds (throughout) present. No wheezing, rhonchi or rales.  Abdominal:     General: There is distension.     Palpations: Abdomen is soft.     Tenderness: There is no abdominal tenderness. There is no guarding.  Musculoskeletal:     Comments: +1 bilateral pitting edema  Skin:    General: Skin is warm and dry.  Neurological:     Mental Status: She is alert and oriented to person, place, and time. Mental status is at baseline.  Psychiatric:        Mood and Affect: Mood normal.  Behavior: Behavior normal.    Data Reviewed: CBC with WBC of 11.2, hemoglobin of 12.8, platelets of 280 CMP with sodium of 142, potassium 3.0, bicarb 22, glucose 102, creatinine 1.03, AST 19, ALT 16 and GFR 56 BNP improved at 170 Iron panel obtained on 9/10 with iron saturation of 15, total iron of 35 TIBC of 232  EKG was personally reviewed.  Sinus rhythm with rate of 58.  No ST or T wave changes concerning for acute ischemia.  DG Chest 1 View  Result Date: 03/28/2023 CLINICAL DATA:  Shortness of breath EXAM: CHEST  1 VIEW COMPARISON:  12/13/2022 FINDINGS: Cardiomegaly with vascular congestion and diffuse increased interstitial opacities suspicious for edema. Probable small pleural effusions. No pneumothorax. Right shoulder replacement. IMPRESSION: Cardiomegaly with vascular congestion and probable interstitial edema. Possible small pleural effusions Electronically Signed   By: Jasmine Pang M.D.   On: 03/28/2023 15:48    Results are pending, will review when  available.  Assessment and Plan:  * Acute on chronic heart failure with preserved ejection fraction (HFpEF) (HCC) Per chart review, patient has a history of HFpEF with last EF of 65-70% on echocardiogram in 2021.  RHC/LHC in November 2023 did not demonstrate any obstructive CAD.  Symptoms have been gradually worsening over the last month with significant hypervolemia on examination.  - Telemetry monitoring - Obtain echocardiogram since it has been > 1 year - Lasix 40 mg IV twice daily - Daily weights - Strict in and out - Daily BMP and magnesium - Aggressive potassium replacement  Rectal bleeding Patient reporting approximately 1 week of bright red blood per rectum in the setting of known hemorrhoidal disease. Given bleeding has occurred independently of bowel movements, likely hemorrhoids. Differential also includes diverticulosis but less likely.   - Apply rectal suppository twice daily  CKD stage 3 due to type 2 diabetes mellitus (HCC) Over the last year, GFR has been primarily in the range of CKD stage IIIa.  Currently improved compared to baseline.  - Daily BMP while IV diuresing - Avoid other nephrotoxic agents  Type II diabetes mellitus with neurological manifestations (HCC) History of relatively well-controlled type 2 diabetes with A1c around 7.5% for the last year.  Currently on Farxiga, Humalog, Tresiba  - SSI, moderate - Semglee 6 units at bedtime  Essential hypertension - Continue home antihypertensives  OSA on CPAP - CPAP at bedtime  Persistent atrial fibrillation (HCC) - Continue home Eliquis given rectal bleeding is likely hemorrhoidal - Hold home metoprolol in the setting of bradycardia  Iron deficiency anemia History of iron deficiency anemia, with recent infusion of Feraheme 2 days ago.  Iron panel obtained on 9/10 with normalized iron levels.  Exocrine pancreatic insufficiency Patient was recently diagnosed with exocrine pancreatic insufficiency with  moderately reduced fecal elastase.  She has been started on Creon therapy.  Unfortunately, EPI secondary to type 2 diabetes does not generally improve with Creon therapy.  - Continue home medications  CAD S/P percutaneous coronary angioplasty Patient has known intermittent chest pain which is relatively stable, and she follows up with cardiology.  LHC in November 2023 did not demonstrate any obstructive lesions.  - Continue home regimen  Advance Care Planning:   Code Status: Full Code verified by patient.  Consults: None  Family Communication: No family at bedside  Severity of Illness: The appropriate patient status for this patient is INPATIENT. Inpatient status is judged to be reasonable and necessary in order to provide the required intensity of service to  ensure the patient's safety. The patient's presenting symptoms, physical exam findings, and initial radiographic and laboratory data in the context of their chronic comorbidities is felt to place them at high risk for further clinical deterioration. Furthermore, it is not anticipated that the patient will be medically stable for discharge from the hospital within 2 midnights of admission.   * I certify that at the point of admission it is my clinical judgment that the patient will require inpatient hospital care spanning beyond 2 midnights from the point of admission due to high intensity of service, high risk for further deterioration and high frequency of surveillance required.*  Author: Verdene Lennert, MD 03/28/2023 3:58 PM  For on call review [email or web address].

## 2023-03-28 NOTE — Assessment & Plan Note (Signed)
Continue home antihypertensives 

## 2023-03-28 NOTE — Progress Notes (Signed)
PHARMACY CONSULT NOTE - ELECTROLYTES  Pharmacy Consult for Electrolyte Monitoring and Replacement   Recent Labs: Height: 5\' 4"  (162.6 cm) Weight: 121.9 kg (268 lb 12.8 oz) IBW/kg (Calculated) : 54.7 Estimated Creatinine Clearance: 58.9 mL/min (A) (by C-G formula based on SCr of 1.03 mg/dL (H)). Potassium (mmol/L)  Date Value  03/28/2023 3.0 (L)  06/21/2014 4.0   Magnesium (mg/dL)  Date Value  01/06/7627 2.0  08/05/2012 1.5 (L)   Calcium (mg/dL)  Date Value  31/51/7616 8.5 (L)   Calcium, Total (mg/dL)  Date Value  07/37/1062 8.7   Albumin (g/dL)  Date Value  69/48/5462 3.6  10/30/2012 3.0 (L)   Sodium (mmol/L)  Date Value  03/28/2023 142  03/05/2023 142  06/21/2014 141   Corrected Ca: 8/5 mg/dL  Assessment  Denise Sharp is a 77 y.o. female presenting with shortness of breath and rectal bleeding. PMH significant for Afib,T2DM, HTN, IBS, OSA. Pharmacy has been consulted to monitor and replace electrolytes.  Diet: thin fluids Pertinent medications: furosemide IV 40 mg BID  Goal of Therapy: Electrolytes WNL  Plan:  K+ 3.0. Kcl 40 meq x 1 ordered by medical team.  Check BMP and Mag with AM labs  Thank you for allowing pharmacy to be a part of this patient's care.  Elliot Gurney, PharmD, BCPS Clinical Pharmacist  03/28/2023 3:56 PM

## 2023-03-28 NOTE — ED Triage Notes (Signed)
Pt states that over the past 3 mos she's gained 30lbs, pt went to cardiology who sent her here for an admission for iv lasix, pt states that she had an iron infusion Tuesday and needs another, pt also states that she is on a new medication for insufficient pancreas function and states that she has been having rectal bleeding and is uncertain if its related to the medicine or if she has a bleeding hemorrhoid

## 2023-03-28 NOTE — Assessment & Plan Note (Signed)
-   CPAP at bedtime 

## 2023-03-29 ENCOUNTER — Inpatient Hospital Stay: Admit: 2023-03-29 | Payer: Medicare Other

## 2023-03-29 ENCOUNTER — Inpatient Hospital Stay (HOSPITAL_COMMUNITY)
Admit: 2023-03-29 | Discharge: 2023-03-29 | Disposition: A | Discharge status: Home / Self Care | Payer: Medicare Other | Attending: Internal Medicine | Admitting: Internal Medicine

## 2023-03-29 ENCOUNTER — Inpatient Hospital Stay: Payer: Medicare Other

## 2023-03-29 ENCOUNTER — Other Ambulatory Visit (HOSPITAL_COMMUNITY): Payer: Self-pay

## 2023-03-29 DIAGNOSIS — I25118 Atherosclerotic heart disease of native coronary artery with other forms of angina pectoris: Secondary | ICD-10-CM

## 2023-03-29 DIAGNOSIS — I5031 Acute diastolic (congestive) heart failure: Secondary | ICD-10-CM

## 2023-03-29 DIAGNOSIS — R1011 Right upper quadrant pain: Secondary | ICD-10-CM

## 2023-03-29 DIAGNOSIS — E785 Hyperlipidemia, unspecified: Secondary | ICD-10-CM

## 2023-03-29 DIAGNOSIS — I5033 Acute on chronic diastolic (congestive) heart failure: Secondary | ICD-10-CM | POA: Diagnosis not present

## 2023-03-29 DIAGNOSIS — I4819 Other persistent atrial fibrillation: Secondary | ICD-10-CM | POA: Diagnosis not present

## 2023-03-29 DIAGNOSIS — N183 Chronic kidney disease, stage 3 unspecified: Secondary | ICD-10-CM | POA: Diagnosis not present

## 2023-03-29 LAB — BASIC METABOLIC PANEL
Anion gap: 12 (ref 5–15)
BUN: 9 mg/dL (ref 8–23)
CO2: 26 mmol/L (ref 22–32)
Calcium: 8.1 mg/dL — ABNORMAL LOW (ref 8.9–10.3)
Chloride: 103 mmol/L (ref 98–111)
Creatinine, Ser: 1.04 mg/dL — ABNORMAL HIGH (ref 0.44–1.00)
GFR, Estimated: 55 mL/min — ABNORMAL LOW (ref >60)
Glucose, Bld: 114 mg/dL — ABNORMAL HIGH (ref 70–99)
Potassium: 3.4 mmol/L — ABNORMAL LOW (ref 3.5–5.1)
Sodium: 141 mmol/L (ref 135–145)

## 2023-03-29 LAB — GLUCOSE, CAPILLARY
Glucose-Capillary: 108 mg/dL — ABNORMAL HIGH (ref 70–99)
Glucose-Capillary: 169 mg/dL — ABNORMAL HIGH (ref 70–99)
Glucose-Capillary: 208 mg/dL — ABNORMAL HIGH (ref 70–99)
Glucose-Capillary: 173 mg/dL — ABNORMAL HIGH (ref 70–99)

## 2023-03-29 LAB — CBC WITH DIFFERENTIAL/PLATELET
Abs Immature Granulocytes: 0.08 10*3/uL — ABNORMAL HIGH (ref 0.00–0.07)
Basophils Absolute: 0.1 10*3/uL (ref 0.0–0.1)
Basophils Relative: 1 %
Eosinophils Absolute: 0.2 10*3/uL (ref 0.0–0.5)
Eosinophils Relative: 2 %
HCT: 37.7 % (ref 36.0–46.0)
Hemoglobin: 12.3 g/dL (ref 12.0–15.0)
Immature Granulocytes: 1 %
Lymphocytes Relative: 28 %
Lymphs Abs: 3.2 10*3/uL (ref 0.7–4.0)
MCH: 29.9 pg (ref 26.0–34.0)
MCHC: 32.6 g/dL (ref 30.0–36.0)
MCV: 91.7 fL (ref 80.0–100.0)
Monocytes Absolute: 0.7 10*3/uL (ref 0.1–1.0)
Monocytes Relative: 7 %
Neutro Abs: 6.9 10*3/uL (ref 1.7–7.7)
Neutrophils Relative %: 61 %
Platelets: 290 10*3/uL (ref 150–400)
RBC: 4.11 MIL/uL (ref 3.87–5.11)
RDW: 15 % (ref 11.5–15.5)
WBC: 11.1 10*3/uL — ABNORMAL HIGH (ref 4.0–10.5)
nRBC: 0 % (ref 0.0–0.2)

## 2023-03-29 LAB — ECHOCARDIOGRAM COMPLETE
AR max vel: 2.22 cm2
AV Area VTI: 3.3 cm2
AV Area mean vel: 2.14 cm2
AV Mean grad: 4 mmHg
AV Peak grad: 7 mmHg
Ao pk vel: 1.32 m/s
Area-P 1/2: 2.94 cm2
Height: 64 in
MV VTI: 2.06 cm2
S' Lateral: 2.8 cm
Weight: 4131.2 [oz_av]

## 2023-03-29 LAB — MAGNESIUM
Magnesium: 1.8 mg/dL (ref 1.7–2.4)
Magnesium: 2 mg/dL (ref 1.7–2.4)

## 2023-03-29 LAB — TROPONIN I (HIGH SENSITIVITY)
Troponin I (High Sensitivity): 10 ng/L (ref <18)
Troponin I (High Sensitivity): 10 ng/L (ref <18)

## 2023-03-29 MED ORDER — IOHEXOL 350 MG/ML SOLN
100.0000 mL | Freq: Once | INTRAVENOUS | Status: AC | PRN
  Administered 2023-03-29: 100 mL via INTRAVENOUS

## 2023-03-29 MED ORDER — POTASSIUM CHLORIDE 20 MEQ PO PACK: 60.0000 meq | PACK | Freq: Once | ORAL | Status: DC

## 2023-03-29 MED ORDER — POTASSIUM CHLORIDE CRYS ER 20 MEQ PO TBCR: 20.0000 meq | EXTENDED_RELEASE_TABLET | Freq: Two times a day (BID) | ORAL | Status: DC

## 2023-03-29 MED ORDER — METOPROLOL SUCCINATE ER 25 MG PO TB24
12.5000 mg | ORAL_TABLET | Freq: Every day | ORAL | Status: DC
  Administered 2023-03-29 – 2023-04-01 (×4): 12.5 mg via ORAL
  Filled 2023-03-29 (×4): qty 1

## 2023-03-29 MED ORDER — SIMETHICONE 80 MG PO CHEW
160.0000 mg | CHEWABLE_TABLET | Freq: Once | ORAL | Status: AC
  Administered 2023-03-29: 160 mg via ORAL
  Filled 2023-03-29: qty 2

## 2023-03-29 MED ORDER — POTASSIUM CHLORIDE 20 MEQ PO PACK
60.0000 meq | PACK | Freq: Three times a day (TID) | ORAL | Status: DC
Start: 2023-03-29 — End: 2023-03-29

## 2023-03-29 MED ORDER — MAGNESIUM SULFATE 2 GM/50ML IV SOLN
2.0000 g | Freq: Once | INTRAVENOUS | Status: AC
  Administered 2023-03-29: 2 g via INTRAVENOUS
  Filled 2023-03-29: qty 50

## 2023-03-29 MED ORDER — POTASSIUM CHLORIDE CRYS ER 20 MEQ PO TBCR
60.0000 meq | EXTENDED_RELEASE_TABLET | Freq: Once | ORAL | Status: AC
  Administered 2023-03-29: 60 meq via ORAL
  Filled 2023-03-29: qty 3

## 2023-03-29 NOTE — Consult Note (Signed)
Cardiology Consultation:   Patient ID: Denise Sharp; [id number]; 03-01-1946   Admit date: 03/28/2023 Date of Consult: 03/29/2023  Primary Care Provider: Sherlene Shams, MD Primary Cardiologist: End Primary Electrophysiologist:  None   Patient Profile:   Denise Sharp is a 77 y.o. female with a hx of CAD status post PCI to the ostial and mid LAD in the setting of unstable angina in 12/2019, HFpEF, persistent A-fib diagnosed in 09/2022 status post successful DCCV in 10/2022 on apixaban, DM2, CKD stage III, HTN, HLD, iron deficiency anemia following gastric bypass x 2, IBS, esophageal stenosis, chronic dizziness with gait instability, cervicogenic headache with cervical spine stenosis, obesity, RLS, adrenal mass, recent diagnosis of pancreatic insufficiency, and OSA on CPAP who is being seen today for the evaluation of acute on chronic HFpEF at the request of Dr. Huel Cote.  History of Present Illness:   [name] previously underwent ischemic evaluations and diagnostic cardiac cath in 1987 and 2004, both showing minor luminal irregularities.  Stress testing in 05/2019 was low risk.  In the setting of worsening dyspnea, echo in 12/2019 showed normal LV systolic function with grade 2 diastolic dysfunction and no significant valvular abnormalities.  With ongoing angina during echo, she underwent diagnostic LHC which showed significant disease involving the ostial and mid LAD with otherwise nonobstructive disease.  She underwent successful PCI/DES x 2 to the ostial and mid LAD.  In 08/2020, she reported ongoing dyspnea and chest discomfort with repeat cardiac cath showing mild, nonobstructive CAD with 40% stenosis in the ostial RCA and widely patent LAD stents.  She had elevated filling pressures with recommendation for diuresis which has been intermittently adjusted in the setting of AKI.  Outpatient cardiac monitoring in 10/2020 showed a predominant rhythm of sinus with rare PACs and PVCs and  several episodes of NSVT and SVT lasting up to 8 beats and 18 beats, respectively.  Lexiscan MPI in 01/2021 showed no significant ischemia and was overall low risk.  Repeat Lexiscan MPI in 01/2022 showed no significant ischemia and was low risk.  Outpatient cardiac monitoring in 04/2022 showed a predominant rhythm of sinus with 6 episodes of NSVT lasting up to 11 beats.  R/LHC in 05/2022 showed mild to moderate, nonobstructive CAD similar to prior cath in 08/2020 with widely patent ostial LAD stent and patent mid LAD stent with minimal in-stent restenosis estimated at 10%.  Upper normal left and right heart filling pressures, borderline pulmonary artery pressure, and normal to mildly reduced cardiac output/index.  She was subsequently diagnosed with A-fib in 09/2022 at cardiology visit.  She was initiated on apixaban and underwent successful DCCV in 10/2022.  More recently, she was seen in the office on 03/28/2019 for noting progressive worsening shortness of breath accompanied by wheezing and orthopnea.  There was an approximate 30 pound weight gain by our scale at that time when compared to her visit in our office in 01/2023.  She reported adherence to furosemide 20 mg daily.  She also reported episodes of rectal bleeding and had recently been diagnosed with pancreatic insufficiency, started on Creon.  In this setting, she was transferred to the ED where she was subsequently admitted with acute on chronic HFpEF.  Labs: BNP 170 (improved from 233 days prior at oncology), Hgb 12.8 with baseline around 12-14, potassium 3.0, BUN 8, serum creatinine 1.03, albumin 3.6, AST/ALT normal  Imaging: Chest x-ray with cardiomegaly with vascular congestion and probable interstitial edema and possible small pleural effusions.  Since admission, she  has been maintained on IV furosemide 40 mg twice daily with vigorous urine output documented at 3.3 L for admission to date.  She reports improvement in dyspnea and lower extremity  swelling.  She has maintained sinus rhythm.  Echo pending.    Past Medical History:  Diagnosis Date   (HFpEF) heart failure with preserved ejection fraction (HCC)    a. 12/2019 Echo: EF 65-70%, Gr2 DD. No significant valvular dzs.   Abnormality of gait 03/25/2013   Adrenal mass, left (HCC)    Anginal pain (HCC)    Anxiety    Aortic atherosclerosis (HCC)    Arthritis    Asthma    CAD (coronary artery disease) with h/o Atypical Chest Pain    a. 04/1986 Cath ([facility]): nl cors, EF 65%; b. 07/2012 Cath [facility]): Diff minor irregs; c. 07/2016 MV [facility]): "Equivocal"; d. 08/2016 Cardiac CT Ca2+ score [facility]): Ca2+ 1548; e. 05/2019 MV: No ischemia. EF 75%; f. 12/2019 PCI: LM nl, LAD 65ost (3.5x12 Resolute DES), 32m (2.75x12 Resolute DES),LCX/RCA nl; g. 08/2020 Cath: patent LAD stents, RCA 40ost, elev RH pressures, EF >65%.   Cervical spinal stenosis 1994   due to trauma to back ([employer] accident), has intermittent paralysis and parasthesias   Cervicogenic headache 03/23/2014   CKD (chronic kidney disease), stage III (HCC)    Depression    Diverticulosis    Dizziness    a.) chronic   DJD (degenerative joint disease)    a. Chronic R shoulder pain   Dyspnea    Esophageal stenosis 09/2009   a.) transient outlet obstruction by food, cleared by EGD   Family history of adverse reaction to anesthesia    a.) daughter with (+) PONV   Gastric bypass status for obesity    GERD (gastroesophageal reflux disease)    Headache(784.0)    HLD (hyperlipidemia)    Hypertension    IBS (irritable bowel syndrome)    IDA (iron deficiency anemia)    a.) post 2 unit txfsn 2009, normal endo/colonoscopy by [facility]   Left bundle branch block (LBBB)    a.) Intermittently present - likely rate related.   NSVT (nonsustained ventricular tachycardia) (HCC) 11/15/2020   a.) Holter study 11/15/2020; 11 episodes of NSVT lasting up to 8 beats with a maximum rate of 210 bpm; 14 atrial runs lasting up to 18 beats with a rate of up to 250  bpm.  Some atrial runs felt to be NSVT.   NSVT (nonsustained ventricular tachycardia) (HCC)    a. 10/2020 Zio: 11 runs of NSVT up to 8 beats.   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 10/2020 Zio: 14 atrial runs up to 18 beats, max HR 250.   Restless leg syndrome    Rotator cuff arthropathy, right 08/13/2017   Syncope and collapse 03/12/2014   Type II diabetes mellitus (HCC)     Past Surgical History:  Procedure Laterality Date   APPENDECTOMY     BACK SURGERY     CARDIOVERSION N/A 10/30/2022   Procedure: CARDIOVERSION;  Surgeon: [name], MD;  Location: [facility] ORS;  Service: Cardiovascular;  Laterality: N/A;   CHOLECYSTECTOMY N/A 1976   COLONOSCOPY     CORONARY ANGIOPLASTY     CORONARY STENT INTERVENTION N/A 01/04/2020   Procedure: CORONARY STENT INTERVENTION;  Surgeon: Iran Ouch, MD;  Location: [facility] INVASIVE CV LAB;  Service: Cardiovascular;  Laterality: N/A;  LAD    DIAPHRAGMATIC HERNIA REPAIR  2015   ESOPHAGEAL DILATION     multiple   ESOPHAGOGASTRODUODENOSCOPY (EGD) WITH PROPOFOL N/A 05/11/2021   Procedure: ESOPHAGOGASTRODUODENOSCOPY (EGD) WITH PROPOFOL;  Surgeon: [name], MD;  Location: [facility] ENDOSCOPY;  Service: Endoscopy;  Laterality: N/A;   ESOPHAGOGASTRODUODENOSCOPY (EGD) WITH PROPOFOL N/A 06/13/2021   Procedure: ESOPHAGOGASTRODUODENOSCOPY (EGD) WITH PROPOFOL;  Surgeon: [name], MD;  Location: [facility] ENDOSCOPY;  Service: Endoscopy;  Laterality: N/A;   ESOPHAGOGASTRODUODENOSCOPY (EGD) WITH PROPOFOL N/A 02/28/2023   Procedure: ESOPHAGOGASTRODUODENOSCOPY (EGD) WITH PROPOFOL;  Surgeon: [name], MD;  Location: [facility] ENDOSCOPY;  Service: Endoscopy;  Laterality: N/A;   EYE SURGERY     GASTRIC BYPASS  2000, 2005   Dr. Arlana Pouch IMPLANT PLACEMENT  10/2011   Jeannette How IMPLANT PLACEMENT N/A  09/18/2021   Procedure: Leane Platt IMPLANT FIRST STAGE;  Surgeon: Alfredo Martinez, MD;  Location: [facility] ORS;  Service: Urology;  Laterality: N/A;   INTERSTIM IMPLANT PLACEMENT N/A 09/18/2021   Procedure: Leane Platt IMPLANT SECOND STAGE WITH IMPEDENCE CHECK;  Surgeon: Alfredo Martinez, MD;  Location: [facility] ORS;  Service: Urology;  Laterality: N/A;   INTERSTIM IMPLANT REMOVAL N/A 09/18/2021   Procedure: REMOVAL OF INTERSTIM IMPLANT;  Surgeon: Alfredo Martinez, MD;  Location: [facility] OR[facility];  Service: Urology;  Laterality: N/A;   LEFT HEART CATH AND CORONARY ANGIOGRAPHY N/A 04/1986   LEFT HEART CATH AND CORONARY ANGIOGRAPHY Left 07/24/2012   LEFT HEART CATH AND CORS/GRAFTS ANGIOGRAPHY N/A 12/31/2019   Procedure: LEFT HEART CATH AND CORS/GRAFTS ANGIOGRAPHY;  Surgeon: Antonieta Iba, MD;  Location: [facility];  Service: Cardiovascular;  Laterality: N/A;   PANNICULECTOMY N/A 06/16/2019   Procedure: PANNICULECTOMY;  Surgeon: Allena Napoleon, MD;  Location: [facility] OR;  Service: Plastics;  Laterality: N/A;  3 hours, please   REVERSE SHOULDER ARTHROPLASTY Right 08/13/2017   Procedure: REVERSE RIGHT SHOULDER ARTHROPLASTY;  Surgeon: Teryl Lucy, MD;  Location: [facility] OR;  Service: Orthopedics;  Laterality: Right;   RIGHT/LEFT HEART CATH AND CORONARY ANGIOGRAPHY N/A 09/06/2020   Procedure: RIGHT/LEFT HEART CATH AND CORONARY ANGIOGRAPHY;  Surgeon: [name], MD;  Location: [facility];  Service: Cardiovascular;  Laterality: N/A;   RIGHT/LEFT HEART CATH AND CORONARY ANGIOGRAPHY N/A 06/04/2022   Procedure: RIGHT/LEFT HEART CATH AND CORONARY ANGIOGRAPHY;  Surgeon: [name], MD;  Location: [facility];  Service: Cardiovascular;  Laterality: N/A;   ROTATOR CUFF REPAIR Right    SPINE SURGERY  1995   [place]   TOOTH EXTRACTION  11/13/2021   TOTAL ABDOMINAL HYSTERECTOMY W/ BILATERAL SALPINGOOPHORECTOMY  1974   TOTAL KNEE ARTHROPLASTY Bilateral 2007   Procedure: TOTAL KNEE  ARTHROPLASTY; Surgeons: Gerrit Heck, MD and [name], MD   UMBILICAL HERNIA REPAIR  08/11/2015     Home Meds: Prior to Admission medications   Medication Sig Start Date End Date Taking? Authorizing Provider  apixaban (ELIQUIS) 5 MG TABS tablet Take 1 tablet (5 mg total) by mouth 2 (two) times daily. 01/29/23  Yes End, Cristal Deer, MD  atorvastatin (LIPITOR) 80 MG tablet Take 1 tablet (80 mg total) by mouth daily. 11/12/22  Yes Denise Shams, MD  budesonide-formoterol (SYMBICORT) 160-4.5 MCG/ACT inhaler Inhale 2 puffs into the lungs 2 (two) times daily. 03/07/23  Yes Coralyn Helling, MD  butalbital-acetaminophen-caffeine (FIORICET) 50-325-40 MG tablet TAKE 1 TABLET BY MOUTH EVERY 6 HOURS AS NEEDED FOR HEADACHE. 07/23/22  Yes Denise Shams, MD  CELEBREX 100 MG capsule Take 100  mg by mouth daily. 03/14/23  Yes [provider]  Cholecalciferol (VITAMIN D3) 250 MCG (10000 UT) capsule Take 10,000 Units by mouth daily.   Yes [provider]  clotrimazole-betamethasone (LOTRISONE) cream Apply 1 application topically 2 (two) times daily as needed (irritation).   Yes [provider]  conjugated estrogens (PREMARIN) vaginal cream Apply one pea-sized amount around the opening of the urethra daily for 2 weeks, then 3 times weekly moving forward. 04/20/22  Yes Vaillancourt, Samantha, PA-C  cyanocobalamin (VITAMIN B12) 1000 MCG/ML injection Inject 1 mL (1,000 mcg total) into the muscle every 30 (thirty) days. Inject 1 ml (1000 mcg ) IM weekly x 4,  Then monthly thereafter 01/25/23  Yes Denise Shams, MD  dapagliflozin propanediol (FARXIGA) 10 MG TABS tablet Take 1 tablet (10 mg total) by mouth daily. 11/12/22  Yes Denise Shams, MD  dicyclomine (BENTYL) 20 MG tablet Take 1 tablet (20 mg total) by mouth 3 (three) times daily before meals. 08/07/22  Yes [name], MD  DULoxetine (CYMBALTA) 30 MG capsule Take 1 capsule (30 mg total) by mouth daily. 08/28/22  Yes Glean Salvo, NP  ezetimibe  (ZETIA) 10 MG tablet Take 1 tablet (10 mg total) by mouth daily. 01/29/23 04/29/23 Yes End, Cristal Deer, MD  fexofenadine (ALLEGRA) 180 MG tablet Take 180 mg by mouth as needed for allergies or rhinitis.   Yes [provider]  fluticasone (FLONASE) 50 MCG/ACT nasal spray Place 2 sprays into both nostrils daily. Patient taking differently: Place 2 sprays into both nostrils 3 (three) times daily as needed for allergies. 04/11/22  Yes Glori Luis, MD  furosemide (LASIX) 20 MG tablet Take 1 tablet (20 mg total) by mouth daily. 01/29/23 04/29/23 Yes End, Cristal Deer, MD  insulin aspart (NOVOLOG) 100 UNIT/ML injection Inject 10 Units into the skin 3 (three) times daily before meals.   Yes [provider]  insulin degludec (TRESIBA FLEXTOUCH) 100 UNIT/ML FlexTouch Pen Inject 12 Units into the skin daily. 11/12/22  Yes Denise Shams, MD  lipase/protease/amylase (CREON) 36000 UNITS CPEP capsule Take 2 capsules (72,000 Units total) by mouth 3 (three) times daily before meals AND 1 capsule (36,000 Units total) with snacks. 03/08/23 03/02/24 Yes Celso Amy, PA-C  loperamide (IMODIUM) 2 MG capsule Take 4-6 mg by mouth 3 (three) times daily before meals.   Yes [provider]  metoprolol succinate (TOPROL-XL) 25 MG 24 hr tablet Take 0.5 tablets (12.5 mg total) by mouth daily. Take with or immediately following a meal. 01/29/23  Yes End, Cristal Deer, MD  nitroGLYCERIN (NITROSTAT) 0.4 MG SL tablet Place 1 tablet (0.4 mg total) under the tongue every 5 (five) minutes as needed for chest pain. 08/03/22  Yes End, Cristal Deer, MD  nystatin (MYCOSTATIN/NYSTOP) powder Apply 1 g topically 4 (four) times daily as needed (rash).   Yes [provider]  OVER THE COUNTER MEDICATION Apply 1 application  topically daily as needed (pain). [place] Gel topical pain reliever   Yes [provider]  pantoprazole (PROTONIX) 40 MG tablet Take 1 tablet (40 mg total) by mouth daily. 03/04/23  02/27/24 Yes Celso Amy, PA-C  Polyethyl Glycol-Propyl Glycol (SYSTANE OP) Place 1 drop into both eyes 4 (four) times daily as needed (dry eyes).   Yes [provider]  pregabalin (LYRICA) 50 MG capsule Take 1 capsule (50 mg total) by mouth 2 (two) times daily. 01/25/23  Yes Denise Shams, MD  sucralfate (CARAFATE) 1 g tablet Take 1 tablet (1 g total)  by mouth 3 (three) times daily before meals. 01/07/23 01/02/24 Yes Celso Amy, PA-C  albuterol (VENTOLIN HFA) 108 (90 Base) MCG/ACT inhaler Inhale 1-2 puffs into the lungs every 6 (six) hours as needed for wheezing or shortness of breath. Patient not taking: Reported on 03/28/2023 11/03/21   McLean-Scocuzza, Pasty Spillers, MD  benzonatate (TESSALON) 200 MG capsule Take 1 capsule (200 mg total) by mouth 2 (two) times daily as needed for cough. 07/13/22   Denise Shams, MD  Continuous Blood Gluc Receiver (FREESTYLE LIBRE 2 READER) DEVI Use to check sugar at least TID 09/26/20   Denise Shams, MD  Glucagon (GVOKE HYPOPEN 2-PACK) 0.5 MG/0.1ML SOAJ Inject 1 auto injector subcutaneously as needed for severe hypoglycemia. May repeat x 1 after 15 minutes if needed. 09/25/22   Bethanie Dicker, NP  glucose blood (ONETOUCH VERIO) test strip Use to check blood sugar twice daily. 02/23/22   Denise Shams, MD  insulin lispro (HUMALOG) 100 UNIT/ML injection Inject 0.08-0.14 mLs (8-14 Units total) into the skin 3 (three) times daily with meals. Patient not taking: Reported on 03/28/2023 03/23/23   Worthy Rancher B, FNP  Insulin Pen Needle (DROPLET PEN NEEDLES) 31G X 5 MM MISC USE ONE NEEDLE SUBCUTANEOUSLY AS DIRECTED (REMOVE AND DISCARD NEEDLE IN SHARPS CONTAINER IMMEDIATELY AFTER USE) 02/12/22   Denise Shams, MD  magnesium gluconate (MAGONATE) 500 MG tablet Take 500 mg by mouth daily. Patient not taking: Reported on 03/28/2023    [provider]  ondansetron (ZOFRAN-ODT) 4 MG disintegrating tablet Take 1 tablet (4 mg total) by mouth every 8 (eight) hours as  needed for nausea or vomiting. Patient not taking: Reported on 03/28/2023 07/13/22   Denise Shams, MD  OneTouch Delica Lancets 33G MISC Used to check blood sugar two times a day. 02/22/22   Denise Shams, MD  SYRINGE-NEEDLE, DISP, 3 ML (LUER LOCK SAFETY SYRINGES) 25G X 1" 3 ML MISC Use to give b12 injections once monthly 12/27/22   Denise Shams, MD    Inpatient Medications: Scheduled Meds:  apixaban  5 mg Oral BID   atorvastatin  80 mg Oral Daily   celecoxib  100 mg Oral Daily   DULoxetine  30 mg Oral Daily   ezetimibe  10 mg Oral Daily   furosemide  40 mg Intravenous BID   influenza vaccine adjuvanted  0.5 mL Intramuscular Tomorrow-1000   insulin aspart  0-15 Units Subcutaneous TID WC   insulin glargine-yfgn  6 Units Subcutaneous QHS   lipase/protease/amylase  72,000 Units Oral TID AC   mometasone-formoterol  2 puff Inhalation BID   pantoprazole  40 mg Oral Daily   potassium chloride  20 mEq Oral BID   potassium chloride  60 mEq Oral Once   pregabalin  50 mg Oral BID   sodium chloride flush  3 mL Intravenous Q12H   sucralfate  1 g Oral TID AC   Continuous Infusions:  sodium chloride     magnesium sulfate bolus IVPB     PRN Meds: sodium chloride, acetaminophen, lipase/protease/amylase **AND** lipase/protease/amylase, loperamide, ondansetron (ZOFRAN) IV, polyvinyl alcohol, sodium chloride flush  Allergies:   Allergies  Allergen Reactions   Demeclocycline Hives   Erythromycin Nausea And Vomiting and Other (See Comments)    Severe irritable bowel   Flagyl [Metronidazole] Nausea And Vomiting and Other (See Comments)    Severe irritable bowel   Glucophage [Metformin Hcl] Nausea And Vomiting and Other (See Comments)    "Sick" "I won't take anything  that has metformin in it"   Tetracyclines & Related Hives and Rash   Tetracycline Other (See Comments)   Diovan [Valsartan] Nausea Only        Sulfa Antibiotics Other (See Comments) and Rash    As child   Xanax [Alprazolam]  Other (See Comments)    Hyperactivity     Social History:   Social History   Socioeconomic History   Marital status: Married    Spouse name: Alinda Money    Number of children: 2   Years of education: College    Highest education level: Not on file  Occupational History    Employer: retired  Tobacco Use   Smoking status: Never    Passive exposure: Never   Smokeless tobacco: Never  Vaping Use   Vaping status: Never Used  Substance and Sexual Activity   Alcohol use: No   Drug use: Not Currently    Comment: prescribed valium   Sexual activity: Not Currently    Birth control/protection: Post-menopausal, Surgical  Other Topics Concern   Not on file  Social History Narrative   Patient lives at home with husband Alinda Money.    Right Handed   Drinks caffeinated tea occasionally   One pet in house, dog   Social Determinants of Health   Financial Resource Strain: Low Risk  (09/15/2021)   Overall Financial Resource Strain (CARDIA)    Difficulty of Paying Living Expenses: Not hard at all  Food Insecurity: No Food Insecurity (03/28/2023)   Hunger Vital Sign    Worried About Running Out of Food in the Last Year: Never true    Ran Out of Food in the Last Year: Never true  Transportation Needs: No Transportation Needs (03/28/2023)   PRAPARE - Administrator, Civil Service (Medical): No    Lack of Transportation (Non-Medical): No  Physical Activity: Insufficiently Active (11/15/2020)   Exercise Vital Sign    Days of Exercise per Week: 2 days    Minutes of Exercise per Session: 40 min  Stress: Stress Concern Present (08/03/2020)   [employer] of Occupational Health - Occupational Stress Questionnaire    Feeling of Stress : Rather much  Social Connections: Not on file  Intimate Partner Violence: Not At Risk (03/28/2023)   Humiliation, Afraid, Rape, and Kick questionnaire    Fear of Current or Ex-Partner: No    Emotionally Abused: No    Physically Abused: No    Sexually Abused: No      Family History:   Family History  Problem Relation Age of Onset   Heart disease Father    Hypertension Father    Prostate cancer Father    Stroke Father    Osteoporosis Father    Stroke Mother    Depression Mother    Headache Mother    Heart disease Mother    Thyroid disease Mother    Hypertension Mother    Diabetes Daughter    Heart disease Daughter    Hypertension Daughter    Hypertension Son     ROS:  Review of Systems  Constitutional:  Positive for malaise/fatigue. Negative for chills, diaphoresis, fever and weight loss.  HENT:  Negative for congestion.   Eyes:  Negative for discharge and redness.  Respiratory:  Positive for shortness of breath. Negative for cough, sputum production and wheezing.   Cardiovascular:  Positive for orthopnea and leg swelling. Negative for chest pain, palpitations, claudication and PND.  Gastrointestinal:  Positive for blood in stool. Negative for abdominal  pain, heartburn, melena, nausea and vomiting.  Musculoskeletal:  Negative for falls and myalgias.  Skin:  Negative for rash.  Neurological:  Negative for dizziness, tingling, tremors, sensory change, speech change, focal weakness, loss of consciousness and weakness.  Endo/Heme/Allergies:  Does not bruise/bleed easily.  Psychiatric/Behavioral:  Negative for substance abuse. The patient is not nervous/anxious.   All other systems reviewed and are negative.     Physical Exam/Data:   Vitals:   03/28/23 2245 03/29/23 0041 03/29/23 0359 03/29/23 0800  BP:  (!) 126/54 (!) 121/57 132/68  Pulse: 65 70 63 68  Resp: (!) 25 18 20    Temp:  98 F (36.7 C) 98.4 F (36.9 C) 99.1 F (37.3 C)  TempSrc:   Oral Oral  SpO2: 93% 91% 94% 95%  Weight:      Height:        Intake/Output Summary (Last 24 hours) at 03/29/2023 0946 Last data filed at 03/29/2023 0359 Gross per 24 hour  Intake --  Output 3300 ml  Net -3300 ml   Filed Weights   03/28/23 1207 03/28/23 2120  Weight: 121.9 kg 122.3  kg   Body mass index is 46.28 kg/m.   Physical Exam: General: Well developed, well nourished, in no acute distress. Head: Normocephalic, atraumatic, sclera non-icteric, no xanthomas, nares without discharge.  Neck: Negative for carotid bruits. JVD elevated approximately 8 cm. Lungs: Diminished breath sounds along the bases bilaterally. Breathing is unlabored. Heart: RRR with occasional extrasystoles with S1 S2. No murmurs, rubs, or gallops appreciated. Abdomen: Soft, non-tender, non-distended with normoactive bowel sounds. No hepatomegaly. No rebound/guarding. No obvious abdominal masses. Msk:  Strength and tone appear normal for age. Extremities: No clubbing or cyanosis.  Mild bilateral lower extremity edema. Distal pedal pulses are 2+ and equal bilaterally. Neuro: Alert and oriented X 3. No facial asymmetry. No focal deficit. Moves all extremities spontaneously. Psych:  Responds to questions appropriately with a normal affect.   EKG:  The EKG was personally reviewed and demonstrates: Sinus bradycardia, 58 bpm, PACs with blocked PACs, artifact with baseline wandering, nonspecific anterolateral ST-T changes consistent with prior tracing Telemetry:  Telemetry was personally reviewed and demonstrates: Sinus rhythm with PACs and artifact  Weights: Filed Weights   03/28/23 1207 03/28/23 2120  Weight: 121.9 kg 122.3 kg    Relevant CV Studies:  2D echo pending __________  [facility] 06/04/2022: Conclusions: Mild-moderate, non-obstructive coronary artery disease, similar to prior catheterization in 08/2020. Widely patent ostial LAD stent. Patent mid LAD stent with minimal in stent restenosis (~10% narrowing). Upper normal left and right heart filling pressures. Borderline elevated pulmonary artery pressure. Normal to mildly reduced cardiac output/index.   Recommendations: No obvious findings to explain the patient's progressive symptoms and NSVT on recent event monitor. Restart  metoprolol succinate 25 mg daily. Aggressive secondary prevention of coronary artery disease and medical therapy for chronic HFpEF. __________  Zio patch 04/2022:   The patient was enrolled for 30 days.   The predominant rhythm was sinus with an average rate of 76 bpm (range 56-122 bpm).   Six episodes of nonsustained ventricular tachycardia were observed, lasting up to 11 beats.   No sustained arrhythmia or prolonged pause occurred.  There was no evidence of atrial fibrillation/flutter.   Patient triggered events correspond to sinus rhythm, PACs, and PVCs.   Predominantly sinus rhythm with several episodes of nonsustained ventricular tachycardia.  No atrial fibrillation/flutter identified. __________  [facility] MPI 01/18/2022: Pharmacological myocardial perfusion imaging study with no significant  ischemia Normal  wall motion, EF estimated at 95% No EKG changes concerning for ischemia at peak stress or in recovery. CT attenuation correction images with coronary calcification in the LAD Low risk scan __________  [facility] 01/26/2021: Pharmacological myocardial perfusion imaging study with no significant  ischemia Normal wall motion, EF estimated at 96% No EKG changes concerning for ischemia at peak stress or in recovery. CT attenuation correction images with no significant aortic atherosclerosis, stents noted in the LAD Low risk scan __________  Zio patch 10/2020: The patient was monitored for 4 days, 22 hours. The predominant rhythm was sinus with an average rate of 77 bpm (range 53-121 bpm and sinus). There were rare PACs and PVCs. 11 episodes of nonsustained ventricular tachycardia were observed, lasting up to 8 beats with a maximum rate of 210 bpm. 14 atrial runs lasting up to 18 beats with a maximum rate of 250 bpm occurred. Some episodes labeled as supraventricular tachycardia with aberrancy could represent nonsustained ventricular tachycardia. Patient triggered events  correspond to sinus rhythm and sinus rhythm with PVCs and artifact.   Predominantly sinus rhythm with rare PACs and PVCs.  Several episodes of nonsustained ventricular tachycardia and supraventricular tachycardia were observed. __________  [facility] 09/06/2020: Conclusions: Non-obstructive coronary artery disease with 40% ostial RCA stenosis. Widely patient ostial/proximal and mid LAD stents, with the distal LAD tapering to a small vessel. Mildly elevated left heart and pulmonary artery pressures. Severely elevated right heart filling pressures. Low Fick cardiac output/index consistent with HFpEF.   Recommendations: Continue gentle diuresis. Encourage weight loss and exercise. Consider outpatient referral to advanced heart failure clinic for further evaluation/management of chronic HFpEF. Continue secondary prevention of coronary artery disease. __________   2D echo 12/2019: 1. Left ventricular ejection fraction, by estimation, is 65 to 70%. The  left ventricle has normal function. The left ventricle has no regional  wall motion abnormalities. Left ventricular diastolic parameters are  consistent with Grade II diastolic  dysfunction (pseudonormalization).   2. Right ventricular systolic function is normal. The right ventricular  size is normal.   3. The mitral valve is normal in structure. No evidence of mitral valve  regurgitation. No evidence of mitral stenosis.   4. The aortic valve is normal in structure. Aortic valve regurgitation is  not visualized. No aortic stenosis is present. __________   [facility] 12/2019: Coronary angiography:  Coronary dominance: Right  Left mainstem:   Large vessel that bifurcates into the LAD and left circumflex, no significant disease noted  Left anterior descending (LAD):   Large vessel that extends to the apical region, diagonal branch 2 of moderate size, 65% ostial/proximal LAD disease/regions of haziness, 80% mid LAD disease  Left circumflex (LCx):   Large vessel with OM branch 2, no significant disease noted  Right coronary artery (RCA):  Right dominant vessel with PL and PDA, no significant disease noted  Left ventriculography: Left ventricular systolic function is normal, LVEF is estimated at 55-65%, there is no significant mitral regurgitation , no significant aortic valve stenosis  Final Conclusions:   Moderate ostial/proximal LAD disease Severe mid LAD disease Normal ejection fraction  Recommendations:  Difficult decision whether to proceed with aggressive intervention Recent negative stress test, symptoms with some typical and atypical features She is relatively immobile, she does report some dysphagia symptoms, unclear if she is appreciating more GI symptoms Recommend starting isosorbide 30 mg daily Images discussed with Dr. Okey Dupre -We will need to consider PCI of the mid LAD, consider FFR of ostial LAD if she  continues to have anginal symptoms.  Would recommend this be done at [facility], consider radial access given right groin access very difficult __________   PCI 12/2019: Suezanne Jacquet LAD to Prox LAD lesion is 80% stenosed. Post intervention, there is a 0% residual stenosis. Post intervention, there is a 0% residual stenosis. A drug-eluting stent was successfully placed using a STENT RESOLUTE ONYX 3.5X12. Mid LAD lesion is 80% stenosed. A drug-eluting stent was successfully placed using a STENT RESOLUTE ONYX [id number].   Successful PCI and drug-eluting stent placement to mid as well as ostial LAD.   Recommendations: Dual antiplatelet therapy for at least 6 months but should consider long-term dual antiplatelet therapy given that the ostial LAD stent extends a millimeter into the left main coronary artery Aggressive treatment of risk factors.   Laboratory Data:  Chemistry Recent Labs  Lab 03/26/23 1430 03/28/23 1213 03/29/23 0443  NA 140 142 141  K 3.2* 3.0* 3.4*  CL 109 106 103  CO2 22 22 26   GLUCOSE 144* 102* 114*   BUN 10 8 9   CREATININE 1.31* 1.03* 1.04*  CALCIUM 7.9* 8.5* 8.1*  GFRNONAA 42* 56* 55*  ANIONGAP 9 14 12     Recent Labs  Lab 03/26/23 1430 03/28/23 1213  PROT 6.5 7.3  ALBUMIN 3.2* 3.6  AST 18 19  ALT 16 16  ALKPHOS 109 125  BILITOT 0.5 1.0   Hematology Recent Labs  Lab 03/26/23 1430 03/28/23 1213 03/29/23 0443  WBC 12.0* 11.2* 11.1*  RBC 3.86* 4.32 4.11  HGB 11.3* 12.8 12.3  HCT 35.6* 41.7 37.7  MCV 92.2 96.5 91.7  MCH 29.3 29.6 29.9  MCHC 31.7 30.7 32.6  RDW 15.0 15.1 15.0  PLT 264 280 290   Cardiac EnzymesNo results for input(s): "TROPONINI" in the last 168 hours. No results for input(s): "TROPIPOC" in the last 168 hours.  BNP Recent Labs  Lab 03/26/23 1428 03/28/23 1213  BNP 230.6* 170.0*    DDimer No results for input(s): "DDIMER" in the last 168 hours.  Radiology/Studies:  DG Chest 1 View  Result Date: 03/28/2023 IMPRESSION: Cardiomegaly with vascular congestion and probable interstitial edema. Possible small pleural effusions Electronically Signed   By: Jasmine Pang M.D.   On: 03/28/2023 15:48    Assessment and Plan:   1.  Acute on chronic HFpEF: -Volume status is improving, though she remains volume up -Agree with IV furosemide 40 mg twice daily with KCl repletion -Obtain echo to evaluate for new cardiomyopathy -Look to resume PTA Farxiga in the next 24 to 48 hours -Has not been maintained on MRA in the setting of underlying renal dysfunction -Consider escalation of GDMT with addition of ARNI throughout admission or in hospital follow-up -Daily weights, strict I's and O's  2.  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  Persistent A-fib: -Maintaining sinus rhythm with PACs -Resume PTA Toprol-XL 12.5 mg -CHA2DS2-VASc at least 7 (CHF, HTN, age x 2, DM, vascular disease, sex category) -Remains on apixaban 5 mg  twice daily and does not meet reduced dosing criteria  4.  Rectal bleeding: -Hemoglobin stable -Felt to be likely hemorrhoidal -Monitor -No current indication for interruption of OAC at this time  5.  CKD stage III: -Monitor with diuresis  6.  Hypokalemia: -Replete to goal 4.0  7.  HTN: -Blood pressure well-controlled -Not requiring scheduled antihypertensive therapy PTA  8.  HLD: -LDL 45 in 10/2022 -Remains on atorvastatin 80 mg and ezetimibe 10 mg  9.  OSA: -CPAP   For questions or updates, please contact [facility] Please consult [email or web address] for contact info under Cardiology/STEMI.   Signed, Eula Listen, PA-C [facility] Pager: [phone] 03/29/2023, 9:46 AM

## 2023-03-29 NOTE — Progress Notes (Signed)
PT Cancellation Note  Patient Details Name: Denise Sharp MRN: [id number] DOB: 09/16/1945   Cancelled Treatment:    Reason Eval/Treat Not Completed: Other (comment) Orders received, chart reviewed. Attempted x 2 this date. First attempt, patient recently returned to bed after working with OT. On second attempt, patient receiving bath from nursing students. Will re-attempt as schedule allows.   Maylon Peppers, PT, DPT Physical Therapist - [place]  [facility]    Muslima Toppins A Jasdeep Dejarnett 03/29/2023, 1:28 PM

## 2023-03-29 NOTE — Evaluation (Signed)
Occupational Therapy Evaluation Patient Details Name: Denise Sharp MRN: [id number] DOB: 08/18/1945 Today's Date: 03/29/2023   History of Present Illness 77 y.o. female with medical history significant of CAD s/p PCI to ostial and mid LAD (2021), HFpEF, atrial fibrillation on Eliquis, CKD stage IIIa, type 2 diabetes, iron deficiency anemia on IV iron infusions, gastric bypass x 2, esophageal stenosis s/p dilation, chronic dizziness with gait instability, cervicogenic headache, OSA on CPAP, restless leg syndrome, who presents to the ED due to leg swelling and rectal bleeding   Clinical Impression   Upon entering the room, pt supine in bed and agreeable to OT intervention. Pt reports living at home with husband who assists pt with IADLs and self care needs such as hygiene with toileting and LB clothing. Pt utilizes RW for short distances and Art gallery manager in community. Pt performs supine >sit without assistance. Pt stands with supervision and ambulates to bathroom with RW. Pt performs clothing management without assistance and is seated on commode for BM. Pt appears to be at functional baseline and reports she is moving better than she has in several weeks at this time. Pt requests to remain on commode and RN notified.       If plan is discharge home, recommend the following: Assistance with cooking/housework;Assist for transportation;A little help with bathing/dressing/bathroom;A little help with walking and/or transfers    Functional Status Assessment  Patient has had a recent decline in their functional status and demonstrates the ability to make significant improvements in function in a reasonable and predictable amount of time.  Equipment Recommendations  None recommended by OT       Precautions / Restrictions Precautions Precautions: Fall      Mobility Bed Mobility Overal bed mobility: Modified Independent                  Transfers Overall transfer level: Needs  assistance Equipment used: Rolling walker (2 wheels) Transfers: Sit to/from Stand Sit to Stand: Supervision                  Balance Overall balance assessment: Needs assistance Sitting-balance support: Feet supported Sitting balance-Leahy Scale: Good     Standing balance support: Reliant on assistive device for balance, During functional activity, Bilateral upper extremity supported Standing balance-Leahy Scale: Fair                             ADL either performed or assessed with clinical judgement   ADL Overall ADL's : At baseline                                       General ADL Comments: OT anticipates set up A for UB self care. Pt ambulates with supervision to bathroom and manages her clothing before sitting on commode. Supervision for functional transfers. Husband assists with toilet hygiene.     Vision Patient Visual Report: No change from baseline              Pertinent Vitals/Pain Pain Assessment Pain Assessment: No/denies pain     Extremity/Trunk Assessment Upper Extremity Assessment Upper Extremity Assessment: Overall WFL for tasks assessed   Lower Extremity Assessment Lower Extremity Assessment: Overall WFL for tasks assessed       Communication     Cognition Arousal: Alert Behavior During Therapy: WFL for tasks assessed/performed Overall Cognitive Status: Within Functional  Limits for tasks assessed                                                  Home Living Family/patient expects to be discharged to:: Private residence Living Arrangements: Spouse/significant other Available Help at Discharge: Family;Available 24 hours/day Type of Home: House Home Access: Ramped entrance     Home Layout: One level     Bathroom Shower/Tub: Producer, television/film/video: Handicapped height     Home Equipment: Agricultural consultant (2 wheels);Electric scooter;Shower seat;Cane - single point;Rollator (4  wheels)          Prior Functioning/Environment Prior Level of Function : Needs assist               ADLs Comments: Pt can ambulate short distances with RW. She endorses needing assistance for ADLs from husband and assist with toileting. He performs majority of IADLs.                 OT Goals(Current goals can be found in the care plan section) Acute Rehab OT Goals Patient Stated Goal: to feel better OT Goal Formulation: With patient Time For Goal Achievement: 03/29/23 Potential to Achieve Goals: Fair  OT Frequency:         AM-PAC OT "6 Clicks" Daily Activity     Outcome Measure Help from another person eating meals?: None Help from another person taking care of personal grooming?: None Help from another person toileting, which includes using toliet, bedpan, or urinal?: A Little Help from another person bathing (including washing, rinsing, drying)?: A Little Help from another person to put on and taking off regular upper body clothing?: None Help from another person to put on and taking off regular lower body clothing?: A Little 6 Click Score: 21   End of Session Equipment Utilized During Treatment: Rolling walker (2 wheels) Nurse Communication: Mobility status;Other (comment) (Pt in bathroom)  Activity Tolerance: Patient tolerated treatment well Patient left: in bed;with call bell/phone within reach;with bed alarm set                   Time: 0940-1000 OT Time Calculation (min): 20 min Charges:  OT General Charges $OT Visit: 1 Visit OT Evaluation $OT Eval Low Complexity: 1 Low OT Treatments $Self Care/Home Management : 8-22 mins  Jackquline Denmark, MS, OTR/L , CBIS ascom [phone]  03/29/23, 1:02 PM

## 2023-03-29 NOTE — Progress Notes (Signed)
[facility] HF Stewardship  PCP: Sherlene Shams, MD  PCP-Cardiologist: [name], MD  HPI: Denise Sharp is a 77 y.o. female with CAD s/p PCI to ostial and mid LAD (2021), HFpEF, atrial fibrillation on Eliquis, CKD stage IIIa, type 2 diabetes, iron deficiency anemia on IV iron infusions, gastric bypass x 2, esophageal stenosis s/p dilation, chronic dizziness with gait instability, cervicogenic headache, OSA on CPAP, restless leg syndrome who presented with swelling and rectal bleeding. Bleeding suspected to be hemorrhoids. BNP 230.6 on admission. Started on IV furosemide with good urine output. BNP now down to 170 on 9/12. Repeat echo ordered.  Pertinent cardiac history: TTE in 2013 showed normal EF with normal diastolic function. Cardiac cath noted in 2014 but unable to see results. TTE 08/2017 showed LVEF 65-70 with G2DD. Stress test in 2020 was normal with hyperdynamic LV. TTE in 2021 showed LVEF normal and G2DD. Cardiac cath in 2021 showed ost LAD to prox LAD 65% stenosis, and mid LAD lesion 80% stenosis treated with stenting. Cath in 2022 showed low Fick CO and elevated RH pressures. Also noted to have patent stents and 40% RCA stenosis. Long term heart monitor was placed in 2022 and metoprolol started due to some PVC and fast rates. Stress test negative in 2022 with estimated LVEF of 96%.  Pertinent Lab Values: Creat  Date Value Ref Range Status  07/13/2022 1.14 (H) 0.60 - 1.00 mg/dL Final   Creatinine, Ser  Date Value Ref Range Status  03/29/2023 1.04 (H) 0.44 - 1.00 mg/dL Final   BUN  Date Value Ref Range Status  03/29/2023 9 8 - 23 mg/dL Final  13/02/6577 13 8 - 27 mg/dL Final  46/96/2952 12 7 - 18 mg/dL Final   Potassium  Date Value Ref Range Status  03/29/2023 3.4 (L) 3.5 - 5.1 mmol/L Final  06/21/2014 4.0 3.5 - 5.1 mmol/L Final   Sodium  Date Value Ref Range Status  03/29/2023 141 135 - 145 mmol/L Final  03/05/2023 142 134 - 144 mmol/L Final  06/21/2014 141 136 -  145 mmol/L Final   B Natriuretic Peptide  Date Value Ref Range Status  03/28/2023 170.0 (H) 0.0 - 100.0 pg/mL Final    Comment:    Performed at [facility], [address], [place], [place] [address]   Magnesium  Date Value Ref Range Status  03/29/2023 1.8 1.7 - 2.4 mg/dL Final    Comment:    Performed at [facility], [address] [place], [place] [address]  08/05/2012 1.5 (L) mg/dL Final    Comment:    2.7-2.5 THERAPEUTIC RANGE: 4-7 mg/dL TOXIC: > 10 mg/dL  -----------------------    Hemoglobin A1C  Date Value Ref Range Status  03/21/2023 7.6 (A) 4.0 - 5.6 % Final  08/14/2018 6.5  Final   Hgb A1c MFr Bld  Date Value Ref Range Status  07/13/2022 7.1 (H) <5.7 % of total Hgb Final    Comment:    For someone without known diabetes, a hemoglobin A1c value of 6.5% or greater indicates that they may have  diabetes and this should be confirmed with a follow-up  test. . For someone with known diabetes, a value <7% indicates  that their diabetes is well controlled and a value  greater than or equal to 7% indicates suboptimal  control. A1c targets should be individualized based on  duration of diabetes, age, comorbid conditions, and  other considerations. . Currently, no consensus exists regarding use of hemoglobin A1c for  diagnosis of diabetes for children. .    TSH  Date Value Ref Range Status  09/27/2022 2.887 0.350 - 4.500 uIU/mL Final    Comment:    Performed by a 3rd Generation assay with a functional sensitivity of <=0.01 uIU/mL. Performed at [facility], [address] [place], [place] [address]   01/01/2019 3.50 0.35 - 4.50 uIU/mL Final   LDH  Date Value Ref Range Status  12/28/2020 142 98 - 192 U/L Final    Comment:    Performed at [facility], [address] [place], [place] [address]    Vital Signs: Temp:  [97.9 F (36.6 C)-98.6 F (37 C)] 98.4 F (36.9 C) (09/13 0359) Pulse Rate:  [47-70] 63 (09/13  0359) Cardiac Rhythm: Atrial fibrillation (09/12 2121) Resp:  [14-25] 20 (09/13 0359) BP: (121-156)/(54-85) 121/57 (09/13 0359) SpO2:  [91 %-97 %] 94 % (09/13 0359) Weight:  [119.3 kg (263 lb)-122.3 kg (269 lb 10 oz)] 122.3 kg (269 lb 10 oz) (09/12 2120)   Intake/Output Summary (Last 24 hours) at 03/29/2023 0739 Last data filed at 03/29/2023 0359 Gross per 24 hour  Intake --  Output 3300 ml  Net -3300 ml    Current Inpatient Medications:  -Furosemide 40 mg IV BID  Prior to admission HF Medications:  -Furosemide 20 mg daily -Metoprolol succinate 12.5 mg daily -Spironolactone 12.5 mg daily -Farxiga 10 mg daily  Assessment: 1. 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 NYHA class II-III symptoms.  -Excellent urine output with improvement in symptoms. Urine color very light yellow, still with more fluid to pull. Creatinine stable. Mg and K low this AM, replacement ordered. -BP 120s-150s systolic today. HR 60s. -On appropriate HFpEF GDMT of spironolactone and Farxiga at home. Consider resuming spironolactone for diuresis, HFpEF and hypokalemia.  Can resume Farxiga after pure wick is removed.  Plan: 1) Medication changes recommended at this time: -Consider adding back spironolactone 12.5 mg daily from PTA meds  2) Patient assistance: Sherryll Burger: $124.09, Farxiga: $105.04, Jardiance: $110.23   3) Education: -- Patient has been educated on current HF medications and potential additions to HF medication regimen - Patient verbalizes understanding that over the next few months, these medication doses may change and more medications may be added to optimize HF regimen - Patient has been educated on basic disease state pathophysiology and goals of therapy  Medication Assistance / Insurance Benefits Check: Does the patient have prescription insurance?    Type of insurance plan:  Does the patient qualify for medication assistance through manufacturers or grants?  Pending   Outpatient Pharmacy: Prior to admission outpatient pharmacy: Mail order     Thank you for involving pharmacy in this patient's care.  Enos Fling, PharmD, BCPS Phone - [phone] Clinical Pharmacist 03/29/2023 10:48 AM

## 2023-03-29 NOTE — Progress Notes (Signed)
[name]  vitals       CROSS COVER NOTE  NAME: Denise Sharp MRN: [id number] DOB : [birth year]    Concern as stated by nurse / staff   Complains of abdominal pain 8/10 RUQ and would like some medicine for it     Pertinent findings on chart review: Significant complex history surgical and medical. Has had chole cystectomy, apendectomy and 2 gastric bypass. She has chronic diarrhea and IBS as well as GERD and recent diagnosis of pancreatic insufficiency (recently started on creon).  She was admitted for CHF exacerbation. She has CAD with PCI, a fib. Echo with normal EF and likely  grade 2 diastolic dysfunction  Assessment and  Interventions   Assessment:    03/29/2023    3:00 PM 03/29/2023    2:00 PM 03/29/2023   11:59 AM  Vitals with BMI  Systolic 117 100 542  Diastolic 48 60 56  Pulse 68 79 67       Latest Ref Rng & Units 03/29/2023    4:43 AM 03/28/2023   12:13 PM 03/26/2023    2:30 PM  BMP  Glucose 70 - 99 mg/dL 706  237  628   BUN 8 - 23 mg/dL 9  8  10    Creatinine 0.44 - 1.00 mg/dL 3.15  1.76  1.60   Sodium 135 - 145 mmol/L 141  142  140   Potassium 3.5 - 5.1 mmol/L 3.4  3.0  3.2   Chloride 98 - 111 mmol/L 103  106  109   CO2 22 - 32 mmol/L 26  22  22    Calcium 8.9 - 10.3 mg/dL 8.1  8.5  7.9        Latest Ref Rng & Units 03/29/2023    4:43 AM 03/28/2023   12:13 PM 03/26/2023    2:30 PM  CBC  WBC 4.0 - 10.5 K/uL 11.1  11.2  12.0   Hemoglobin 12.0 - 15.0 g/dL 73.7  10.6  26.9   Hematocrit 36.0 - 46.0 % 37.7  41.7  35.6   Platelets 150 - 400 K/uL 290  280  264      Has had potassium supplements today that she had difficulty swallowing Neuro - no deficits NO chest pain or shortness of breath; EKG done reviewed by me NSR, low voltage, no ischemic changes Onset of pain 30 minutes after eating, last BM was not her normal diarrhea/liquid, had some mucous/gelatinous type Has not passed flatus since before dinner, has had a lot of "belching" today  Plan:  Stool PCR - add C  diff if evidence of colitis on CT  CT abdomen pelvis previously ordered EKG - done; NSR Troponin Simethicone      Donnie Mesa NP [facility] Cross Cover 7pm-7am - check amion for availability Pager [phone]

## 2023-03-29 NOTE — Progress Notes (Signed)
*  PRELIMINARY RESULTS* Echocardiogram 2D Echocardiogram has been performed.  Denise Sharp 03/29/2023, 2:40 PM

## 2023-03-29 NOTE — Plan of Care (Signed)

## 2023-03-29 NOTE — Progress Notes (Addendum)
PROGRESS NOTE    Denise Sharp  ZOX:[id number] DOB: Apr 09, 1946 DOA: 03/28/2023 PCP: Sherlene Shams, MD    Brief Narrative:   From admission h and p  Denise Sharp is a 77 y.o. female with medical history significant of CAD s/p PCI to ostial and mid LAD (2021), HFpEF, atrial fibrillation on Eliquis, CKD stage IIIa, type 2 diabetes, iron deficiency anemia on IV iron infusions, gastric bypass x 2, esophageal stenosis s/p dilation, chronic dizziness with gait instability, cervicogenic headache, OSA on CPAP, restless leg syndrome, who presents to the ED due to leg swelling and rectal bleeding   DeniseSharp states that over the last 1 month, she has been slowly gaining weight despite eating much less.  Her abdomen feels significantly distended.  She has has been experience also seeing creasing lower extremity edema, orthopnea, and dyspnea on exertion.  She is taking her Lasix 20 mg daily as prescribed without improvement.  She denies any chest pain or palpitations at this time.  She notes that in May, she weighed approximately 220 pounds and now she is 270.   Denise Sharp also endorses bright red blood per rectum that is been occurring for the last week.  She states that she finds bright red blood on her depends even when she has not had a bowel movement.  When she was walking to the toilet, she noted that she was having little droplets of blood and then there was blood in the commode as well.  She is uncertain if there is blood mixed into her stool.  She notes a history of hemorrhoids.  She endorses some abdominal cramps intermittently but no abdominal pain.  Assessment & Plan:   Principal Problem:   Acute on chronic heart failure with preserved ejection fraction (HFpEF) (HCC) Active Problems:   Rectal bleeding   CKD stage 3 due to type 2 diabetes mellitus (HCC)   Type II diabetes mellitus with neurological manifestations (HCC)   Essential hypertension   OSA on CPAP   Persistent  atrial fibrillation (HCC)   Iron deficiency anemia   Exocrine pancreatic insufficiency   CAD S/P percutaneous coronary angioplasty   Morbid obesity (HCC)   History of gastric bypass  * Acute on chronic heart failure with preserved ejection fraction (HFpEF) (HCC) Per chart review, patient has a history of HFpEF with last EF of 65-70% on echocardiogram in 2021.  RHC/LHC in November 2023 did not demonstrate any obstructive CAD.  Symptoms have been gradually worsening over the last month with significant hypervolemia on examination. Out 3.3 liters last 24 - Telemetry monitoring - Obtain echocardiogram   - Lasix 40 mg IV twice daily - Daily weights - Strict in and out - Daily BMP and magnesium - replete lytes -- PT eval   Rectal bleeding, likely hemorrhoidal Patient reporting approximately 1 week of bright red blood per rectum in the setting of known hemorrhoidal disease. Given bleeding has occurred independently of bowel movements, likely hemorrhoids. Hgb wnl and stable, bleeding is scant  - Apply rectal suppository twice daily   CKD stage 3 due to type 2 diabetes mellitus (HCC) Over the last year, GFR has been primarily in the range of CKD stage IIIa.  Currently improved compared to baseline. - Daily BMP while IV diuresing - Avoid other nephrotoxic agents   Type II diabetes mellitus with neurological manifestations (HCC) History of relatively well-controlled type 2 diabetes with A1c around 7.5% for the last year.  Currently on Farxiga, Humalog, Tresiba - SSI,  moderate - Semglee 6 units at bedtime   Essential hypertension - Continue home antihypertensives   OSA on CPAP - CPAP at bedtime   Persistent atrial fibrillation (HCC) - Continue home Eliquis given rectal bleeding is likely hemorrhoidal - Holdin home metoprolol in the setting of bradycardia   Iron deficiency anemia History of iron deficiency anemia, with recent infusion of Feraheme.  Iron panel obtained on 9/10 with  normalized iron levels.   Exocrine pancreatic insufficiency Patient was recently diagnosed with exocrine pancreatic insufficiency with moderately reduced fecal elastase.  She has been started on Creon therapy.  - Continue home medications   CAD S/P percutaneous coronary angioplasty Patient has known intermittent chest pain which is relatively stable, and she follows up with cardiology.  LHC in November 2023 did not demonstrate any obstructive lesions. - Continue home regimen   DVT prophylaxis: apixaban Code Status: full Family Communication: none @ bedside  Level of care: Telemetry Cardiac Status is: Inpatient Remains inpatient appropriate because: need for IV diuresis    Consultants:  cardiology  Procedures: none  Antimicrobials:  none    Subjective: Reports breathing stable  Objective: Vitals:   03/28/23 2245 03/29/23 0041 03/29/23 0359 03/29/23 0800  BP:  (!) 126/54 (!) 121/57 132/68  Pulse: 65 70 63 68  Resp: (!) 25 18 20    Temp:  98 F (36.7 C) 98.4 F (36.9 C) 99.1 F (37.3 C)  TempSrc:   Oral Oral  SpO2: 93% 91% 94% 95%  Weight:      Height:        Intake/Output Summary (Last 24 hours) at 03/29/2023 0920 Last data filed at 03/29/2023 0359 Gross per 24 hour  Intake --  Output 3300 ml  Net -3300 ml   Filed Weights   03/28/23 1207 03/28/23 2120  Weight: 121.9 kg 122.3 kg    Examination:  General exam: Appears calm and comfortable  Respiratory system: rales at bases otherwise clear Cardiovascular system: S1 & S2 heard, RRR. No JVD, murmurs, rubs, gallops or clicks. N  Gastrointestinal system: Abdomen is obese, soft and nontender. No organomegaly or masses felt. Normal bowel sounds heard. Central nervous system: Alert and oriented. No focal neurological deficits. Extremities: Symmetric 5 x 5 power. Mild LE edema Skin: No rashes, lesions or ulcers Psychiatry: Judgement and insight appear normal. Mood & affect appropriate.     Data Reviewed: I  have personally reviewed following labs and imaging studies  CBC: Recent Labs  Lab 03/26/23 1430 03/28/23 1213 03/29/23 0443  WBC 12.0* 11.2* 11.1*  NEUTROABS 7.8*  --  6.9  HGB 11.3* 12.8 12.3  HCT 35.6* 41.7 37.7  MCV 92.2 96.5 91.7  PLT 264 280 290   Basic Metabolic Panel: Recent Labs  Lab 03/26/23 1430 03/28/23 1213 03/29/23 0443  NA 140 142 141  K 3.2* 3.0* 3.4*  CL 109 106 103  CO2 22 22 26   GLUCOSE 144* 102* 114*  BUN 10 8 9   CREATININE 1.31* 1.03* 1.04*  CALCIUM 7.9* 8.5* 8.1*  MG  --   --  1.8   GFR: Estimated Creatinine Clearance: 58.4 mL/min (A) (by C-G formula based on SCr of 1.04 mg/dL (H)). Liver Function Tests: Recent Labs  Lab 03/26/23 1430 03/28/23 1213  AST 18 19  ALT 16 16  ALKPHOS 109 125  BILITOT 0.5 1.0  PROT 6.5 7.3  ALBUMIN 3.2* 3.6   No results for input(s): "LIPASE", "AMYLASE" in the last 168 hours. No results for input(s): "AMMONIA" in the  last 168 hours. Coagulation Profile: No results for input(s): "INR", "PROTIME" in the last 168 hours. Cardiac Enzymes: No results for input(s): "CKTOTAL", "CKMB", "CKMBINDEX", "TROPONINI" in the last 168 hours. BNP (last 3 results) No results for input(s): "PROBNP" in the last 8760 hours. HbA1C: No results for input(s): "HGBA1C" in the last 72 hours. CBG: Recent Labs  Lab 03/28/23 1735 03/28/23 2126 03/29/23 0818  GLUCAP 150* 146* 108*   Lipid Profile: No results for input(s): "CHOL", "HDL", "LDLCALC", "TRIG", "CHOLHDL", "LDLDIRECT" in the last 72 hours. Thyroid Function Tests: No results for input(s): "TSH", "T4TOTAL", "FREET4", "T3FREE", "THYROIDAB" in the last 72 hours. Anemia Panel: Recent Labs    03/26/23 1428 03/26/23 1429 03/26/23 1430  VITAMINB12 204  --   --   FERRITIN  --   --  262  TIBC  --   --  232*  IRON  --   --  35  RETICCTPCT  --  2.5  --    Urine analysis:    Component Value Date/Time   COLORURINE YELLOW 02/17/2021 1008   APPEARANCEUR Cloudy (A)  05/01/2022 1142   LABSPEC >=1.030 (A) 02/17/2021 1008   LABSPEC 1.006 06/21/2014 1623   PHURINE 5.0 02/17/2021 1008   GLUCOSEU 3+ (A) 05/01/2022 1142   GLUCOSEU 500 (A) 02/17/2021 1008   HGBUR NEGATIVE 02/17/2021 1008   BILIRUBINUR Negative 05/01/2022 1142   BILIRUBINUR Negative 06/21/2014 1623   KETONESUR NEGATIVE 02/17/2021 1008   PROTEINUR Negative 05/01/2022 1142   PROTEINUR NEGATIVE 02/08/2021 1323   UROBILINOGEN 0.2 02/17/2021 1008   NITRITE Negative 05/01/2022 1142   NITRITE NEGATIVE 02/17/2021 1008   LEUKOCYTESUR 1+ (A) 05/01/2022 1142   LEUKOCYTESUR SMALL (A) 02/17/2021 1008   LEUKOCYTESUR Negative 06/21/2014 1623   Sepsis Labs: @LABRCNTIP (procalcitonin:4,lacticidven:4)  )No results found for this or any previous visit (from the past 240 hour(s)).       Radiology Studies: DG Chest 1 View  Result Date: 03/28/2023 CLINICAL DATA:  Shortness of breath EXAM: CHEST  1 VIEW COMPARISON:  12/13/2022 FINDINGS: Cardiomegaly with vascular congestion and diffuse increased interstitial opacities suspicious for edema. Probable small pleural effusions. No pneumothorax. Right shoulder replacement. IMPRESSION: Cardiomegaly with vascular congestion and probable interstitial edema. Possible small pleural effusions Electronically Signed   By: Jasmine Pang M.D.   On: 03/28/2023 15:48        Scheduled Meds:  apixaban  5 mg Oral BID   atorvastatin  80 mg Oral Daily   celecoxib  100 mg Oral Daily   DULoxetine  30 mg Oral Daily   ezetimibe  10 mg Oral Daily   furosemide  40 mg Intravenous BID   influenza vaccine adjuvanted  0.5 mL Intramuscular Tomorrow-1000   insulin aspart  0-15 Units Subcutaneous TID WC   insulin glargine-yfgn  6 Units Subcutaneous QHS   lipase/protease/amylase  72,000 Units Oral TID AC   mometasone-formoterol  2 puff Inhalation BID   pantoprazole  40 mg Oral Daily   potassium chloride  20 mEq Oral BID   potassium chloride  60 mEq Oral Once   pregabalin  50 mg  Oral BID   sodium chloride flush  3 mL Intravenous Q12H   sucralfate  1 g Oral TID AC   Continuous Infusions:  sodium chloride     magnesium sulfate bolus IVPB       LOS: 1 day     Silvano Bilis, MD [facility]   If 7PM-7AM, please contact night-coverage [email or web address] Password [email or web address] 03/29/2023, 9:20 AM

## 2023-03-29 NOTE — Progress Notes (Signed)
PHARMACY CONSULT NOTE - ELECTROLYTES  Pharmacy Consult for Electrolyte Monitoring and Replacement   Recent Labs: Height: 5\' 4"  (162.6 cm) Weight: 122.3 kg (269 lb 10 oz) IBW/kg (Calculated) : 54.7 Estimated Creatinine Clearance: 58.4 mL/min (A) (by C-G formula based on SCr of 1.04 mg/dL (H)). Potassium (mmol/L)  Date Value  03/29/2023 3.4 (L)  06/21/2014 4.0   Magnesium (mg/dL)  Date Value  16/04/9603 1.8  08/05/2012 1.5 (L)   Calcium (mg/dL)  Date Value  54/03/8118 8.1 (L)   Calcium, Total (mg/dL)  Date Value  14/78/2956 8.7   Albumin (g/dL)  Date Value  21/30/8657 3.6  10/30/2012 3.0 (L)   Sodium (mmol/L)  Date Value  03/29/2023 141  03/05/2023 142  06/21/2014 141   Corrected Ca: 8.5 mg/dL  Assessment  Denise Sharp is a 77 y.o. female presenting with shortness of breath and rectal bleeding. PMH significant for AF,T2DM, HTN, IBS, OSA. Pharmacy has been consulted to monitor and replace electrolytes.  Diet: regular Pertinent medications: furosemide 40 mg IV BID  Goal of Therapy: Electrolytes WNL  Plan:  K 3.4, give KCl 60 mEq PO x1 Mg 1.8, give Mg Sulfate 2g IV x1 Check BMP and Mg with AM labs  Thank you for allowing pharmacy to be a part of this patient's care.  Celene Squibb, PharmD Clinical Pharmacist 03/29/2023 8:34 AM

## 2023-03-29 NOTE — TOC Benefit Eligibility Note (Signed)
Pharmacy Patient Advocate Encounter  Insurance verification completed.    The patient is insured through [employer].     Ran test claim for Marcelline Deist and the current 30 day co-pay is $105.04.  Ran test claim for Entresto and the current 30 day co-pay is $124.09  Ran test claim for Jardiance and the current 30 day co-pay is $110.23.  This test claim was processed through [facility]- copay amounts may vary at other pharmacies due to pharmacy/plan contracts, or as the patient moves through the different stages of their insurance plan.

## 2023-03-29 NOTE — Plan of Care (Signed)
  Problem: Activity: Goal: Ability to return to baseline activity level will improve Outcome: Progressing   Problem: Cardiovascular: Goal: Ability to achieve and maintain adequate cardiovascular perfusion will improve Outcome: Progressing   Problem: Education: Goal: Ability to describe self-care measures that may prevent or decrease complications (Diabetes Survival Skills Education) will improve Outcome: Progressing   Problem: Fluid Volume: Goal: Ability to maintain a balanced intake and output will improve Outcome: Progressing   Problem: Health Behavior/Discharge Planning: Goal: Ability to identify and utilize available resources and services will improve Outcome: Progressing Goal: Ability to manage health-related needs will improve Outcome: Progressing   Problem: Metabolic: Goal: Ability to maintain appropriate glucose levels will improve Outcome: Progressing   Problem: Nutritional: Goal: Maintenance of adequate nutrition will improve Outcome: Progressing   Problem: Tissue Perfusion: Goal: Adequacy of tissue perfusion will improve Outcome: Progressing   Problem: Clinical Measurements: Goal: Will remain free from infection Outcome: Progressing Goal: Diagnostic test results will improve Outcome: Progressing   Problem: Activity: Goal: Risk for activity intolerance will decrease Outcome: Progressing

## 2023-03-30 DIAGNOSIS — Z9861 Coronary angioplasty status: Secondary | ICD-10-CM | POA: Diagnosis not present

## 2023-03-30 DIAGNOSIS — I5033 Acute on chronic diastolic (congestive) heart failure: Secondary | ICD-10-CM | POA: Diagnosis not present

## 2023-03-30 DIAGNOSIS — I2510 Atherosclerotic heart disease of native coronary artery without angina pectoris: Secondary | ICD-10-CM | POA: Diagnosis not present

## 2023-03-30 DIAGNOSIS — I4819 Other persistent atrial fibrillation: Secondary | ICD-10-CM | POA: Diagnosis not present

## 2023-03-30 LAB — GLUCOSE, CAPILLARY
Glucose-Capillary: 188 mg/dL — ABNORMAL HIGH (ref 70–99)
Glucose-Capillary: 247 mg/dL — ABNORMAL HIGH (ref 70–99)
Glucose-Capillary: 235 mg/dL — ABNORMAL HIGH (ref 70–99)
Glucose-Capillary: 290 mg/dL — ABNORMAL HIGH (ref 70–99)

## 2023-03-30 LAB — CBC
HCT: 39.5 % (ref 36.0–46.0)
Hemoglobin: 12.7 g/dL (ref 12.0–15.0)
MCH: 29.5 pg (ref 26.0–34.0)
MCHC: 32.2 g/dL (ref 30.0–36.0)
MCV: 91.9 fL (ref 80.0–100.0)
Platelets: 303 10*3/uL (ref 150–400)
RBC: 4.3 MIL/uL (ref 3.87–5.11)
RDW: 15.5 % (ref 11.5–15.5)
WBC: 13.6 10*3/uL — ABNORMAL HIGH (ref 4.0–10.5)
nRBC: 0 % (ref 0.0–0.2)

## 2023-03-30 LAB — BASIC METABOLIC PANEL
Anion gap: 11 (ref 5–15)
BUN: 13 mg/dL (ref 8–23)
CO2: 28 mmol/L (ref 22–32)
Calcium: 8.2 mg/dL — ABNORMAL LOW (ref 8.9–10.3)
Chloride: 99 mmol/L (ref 98–111)
Creatinine, Ser: 0.99 mg/dL (ref 0.44–1.00)
GFR, Estimated: 59 mL/min — ABNORMAL LOW (ref >60)
Glucose, Bld: 150 mg/dL — ABNORMAL HIGH (ref 70–99)
Potassium: 3.5 mmol/L (ref 3.5–5.1)
Sodium: 138 mmol/L (ref 135–145)

## 2023-03-30 LAB — MAGNESIUM: Magnesium: 2.2 mg/dL (ref 1.7–2.4)

## 2023-03-30 MED ORDER — HYDROCORTISONE ACETATE 25 MG RE SUPP
25.0000 mg | Freq: Two times a day (BID) | RECTAL | Status: DC
  Administered 2023-03-30 – 2023-04-01 (×5): 25 mg via RECTAL
  Filled 2023-03-30 (×5): qty 1

## 2023-03-30 MED ORDER — POTASSIUM CHLORIDE 20 MEQ PO PACK
40.0000 meq | PACK | ORAL | Status: AC
  Administered 2023-03-30 (×2): 40 meq via ORAL
  Filled 2023-03-30 (×2): qty 2

## 2023-03-30 MED ORDER — DAPAGLIFLOZIN PROPANEDIOL 10 MG PO TABS
10.0000 mg | ORAL_TABLET | Freq: Every day | ORAL | Status: DC
  Administered 2023-03-30 – 2023-04-01 (×3): 10 mg via ORAL
  Filled 2023-03-30 (×3): qty 1

## 2023-03-30 NOTE — Progress Notes (Signed)
Progress Note  Patient Name: Denise Sharp Date of Encounter: 03/30/2023  Primary Cardiologist: End  Subjective   Dyspnea continues to improve. No chest pain. Underwent CT abdomen/pelvis last night with abdominal pain that showed no acute findings with known adrenal mass that patient reports has been present for ~ 10 years. Documented UOP 2.2 L for the past 24 hours, net - 6 L for the admission. Weight trend 117.1 to 115.6 kg over the past 24 hours. Renal function stable. Reports frank BRBPR this morning.   Inpatient Medications    Scheduled Meds:  apixaban  5 mg Oral BID   atorvastatin  80 mg Oral Daily   celecoxib  100 mg Oral Daily   DULoxetine  30 mg Oral Daily   ezetimibe  10 mg Oral Daily   furosemide  40 mg Intravenous BID   insulin aspart  0-15 Units Subcutaneous TID WC   insulin glargine-yfgn  6 Units Subcutaneous QHS   lipase/protease/amylase  72,000 Units Oral TID AC   metoprolol succinate  12.5 mg Oral Daily   mometasone-formoterol  2 puff Inhalation BID   pantoprazole  40 mg Oral Daily   pregabalin  50 mg Oral BID   sodium chloride flush  3 mL Intravenous Q12H   sucralfate  1 g Oral TID AC   Continuous Infusions:  sodium chloride     PRN Meds: sodium chloride, acetaminophen, lipase/protease/amylase **AND** lipase/protease/amylase, loperamide, ondansetron (ZOFRAN) IV, polyvinyl alcohol, sodium chloride flush   Vital Signs    Vitals:   03/30/23 0106 03/30/23 0400 03/30/23 0500 03/30/23 0828  BP: (!) 108/59 (!) 102/57  (!) 136/54  Pulse: 67 63  75  Resp: 19 18  18   Temp: 97.7 F (36.5 C) 97.8 F (36.6 C)  98 F (36.7 C)  TempSrc: Oral Oral    SpO2: 97% 94%  96%  Weight:   115.6 kg   Height:        Intake/Output Summary (Last 24 hours) at 03/30/2023 1025 Last data filed at 03/30/2023 0718 Gross per 24 hour  Intake 690 ml  Output 3650 ml  Net -2960 ml   Filed Weights   03/28/23 2120 03/29/23 0701 03/30/23 0500  Weight: 122.3 kg 117.1 kg 115.6  kg    Telemetry    SR with PACs and PVCS with artifact - Personally Reviewed  ECG    No new tracings - Personally Reviewed  Physical Exam   GEN: No acute distress.   Neck: No JVD. Cardiac: RRR, no murmurs, rubs, or gallops.  Respiratory: Mildly diminished breath sounds along the bases bilaterally.  GI: Soft, nontender, non-distended.   MS: Mild bilateral lower extremity edema; No deformity. Neuro:  Alert and oriented x 3; Nonfocal.  Psych: Normal affect.  Labs    Chemistry Recent Labs  Lab 03/26/23 1430 03/28/23 1213 03/29/23 0443 03/30/23 0457  NA 140 142 141 138  K 3.2* 3.0* 3.4* 3.5  CL 109 106 103 99  CO2 22 22 26 28   GLUCOSE 144* 102* 114* 150*  BUN 10 8 9 13   CREATININE 1.31* 1.03* 1.04* 0.99  CALCIUM 7.9* 8.5* 8.1* 8.2*  PROT 6.5 7.3  --   --   ALBUMIN 3.2* 3.6  --   --   AST 18 19  --   --   ALT 16 16  --   --   ALKPHOS 109 125  --   --   BILITOT 0.5 1.0  --   --  GFRNONAA 42* 56* 55* 59*  ANIONGAP 9 14 12 11      Hematology Recent Labs  Lab 03/26/23 1430 03/28/23 1213 03/29/23 0443  WBC 12.0* 11.2* 11.1*  RBC 3.86* 4.32 4.11  HGB 11.3* 12.8 12.3  HCT 35.6* 41.7 37.7  MCV 92.2 96.5 91.7  MCH 29.3 29.6 29.9  MCHC 31.7 30.7 32.6  RDW 15.0 15.1 15.0  PLT 264 280 290    Cardiac EnzymesNo results for input(s): "TROPONINI" in the last 168 hours. No results for input(s): "TROPIPOC" in the last 168 hours.   BNP Recent Labs  Lab 03/26/23 1428 03/28/23 1213  BNP 230.6* 170.0*     DDimer No results for input(s): "DDIMER" in the last 168 hours.   Radiology    CT ABDOMEN PELVIS W CONTRAST  Result Date: 03/29/2023 IMPRESSION: No CT findings to account for the patient's right abdominal pain. 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 Consider follow-up CT or MR abdomen with/without contrast in 1 year, as clinically warranted. Small bilateral pleural effusions. Electronically Signed   By: Charline Bills M.D.   On: 03/29/2023 22:55   DG Chest 1 View  Result Date: 03/28/2023 IMPRESSION: Cardiomegaly with vascular congestion and probable interstitial edema. Possible small pleural effusions Electronically Signed   By: Jasmine Pang M.D.   On: 03/28/2023 15:48    Cardiac Studies   2D echo 03/29/2023: 1. Left ventricular ejection fraction, by estimation, is 60 to 65%. The  left ventricle has normal function. The left ventricle has no regional  wall motion abnormalities. There is mild left ventricular hypertrophy.  Left ventricular diastolic parameters  are consistent with Grade II diastolic dysfunction (pseudonormalization).   2. Right ventricular systolic function is normal. The right ventricular  size is normal.   3. Left atrial size was mild to moderately dilated.   4. Right atrial size was mildly dilated.   5. The mitral valve is degenerative. No evidence of mitral valve  regurgitation.   6. The aortic valve was not well visualized. Aortic valve regurgitation  is mild.  __________   [facility] 06/04/2022: Conclusions: Mild-moderate, non-obstructive coronary artery disease, similar to prior catheterization in 08/2020. Widely patent ostial LAD stent. Patent mid LAD stent with minimal in stent restenosis (~10% narrowing). Upper normal left and right heart filling pressures. Borderline elevated pulmonary artery pressure. Normal to mildly reduced cardiac output/index.   Recommendations: No obvious findings to explain the patient's progressive symptoms and NSVT on recent event monitor. Restart metoprolol succinate 25 mg daily. Aggressive secondary prevention of coronary artery disease and medical therapy for chronic HFpEF. __________   Zio patch 04/2022:   The patient was enrolled for 30 days.   The predominant rhythm was sinus with an average rate of 76 bpm (range 56-122 bpm).   Six episodes of nonsustained ventricular tachycardia were observed, lasting up to 11 beats.   No  sustained arrhythmia or prolonged pause occurred.  There was no evidence of atrial fibrillation/flutter.   Patient triggered events correspond to sinus rhythm, PACs, and PVCs.   Predominantly sinus rhythm with several episodes of nonsustained ventricular tachycardia.  No atrial fibrillation/flutter identified. __________   [facility] 01/18/2022: Pharmacological myocardial perfusion imaging study with no significant  ischemia Normal wall motion, EF estimated at 95% No EKG changes concerning for ischemia at peak stress or in recovery. CT attenuation correction images with coronary calcification in the LAD Low risk scan __________   [facility] 01/26/2021: Pharmacological myocardial  perfusion imaging study with no significant  ischemia Normal wall motion, EF estimated at 96% No EKG changes concerning for ischemia at peak stress or in recovery. CT attenuation correction images with no significant aortic atherosclerosis, stents noted in the LAD Low risk scan __________   Zio patch 10/2020: The patient was monitored for 4 days, 22 hours. The predominant rhythm was sinus with an average rate of 77 bpm (range 53-121 bpm and sinus). There were rare PACs and PVCs. 11 episodes of nonsustained ventricular tachycardia were observed, lasting up to 8 beats with a maximum rate of 210 bpm. 14 atrial runs lasting up to 18 beats with a maximum rate of 250 bpm occurred. Some episodes labeled as supraventricular tachycardia with aberrancy could represent nonsustained ventricular tachycardia. Patient triggered events correspond to sinus rhythm and sinus rhythm with PVCs and artifact.   Predominantly sinus rhythm with rare PACs and PVCs.  Several episodes of nonsustained ventricular tachycardia and supraventricular tachycardia were observed. __________   [facility] 09/06/2020: Conclusions: Non-obstructive coronary artery disease with 40% ostial RCA stenosis. Widely patient ostial/proximal and mid LAD stents,  with the distal LAD tapering to a small vessel. Mildly elevated left heart and pulmonary artery pressures. Severely elevated right heart filling pressures. Low Fick cardiac output/index consistent with HFpEF.   Recommendations: Continue gentle diuresis. Encourage weight loss and exercise. Consider outpatient referral to advanced heart failure clinic for further evaluation/management of chronic HFpEF. Continue secondary prevention of coronary artery disease. __________   2D echo 12/2019: 1. Left ventricular ejection fraction, by estimation, is 65 to 70%. The  left ventricle has normal function. The left ventricle has no regional  wall motion abnormalities. Left ventricular diastolic parameters are  consistent with Grade II diastolic  dysfunction (pseudonormalization).   2. Right ventricular systolic function is normal. The right ventricular  size is normal.   3. The mitral valve is normal in structure. No evidence of mitral valve  regurgitation. No evidence of mitral stenosis.   4. The aortic valve is normal in structure. Aortic valve regurgitation is  not visualized. No aortic stenosis is present. __________   [facility] 12/2019: Coronary angiography:  Coronary dominance: Right  Left mainstem:   Large vessel that bifurcates into the LAD and left circumflex, no significant disease noted  Left anterior descending (LAD):   Large vessel that extends to the apical region, diagonal branch 2 of moderate size, 65% ostial/proximal LAD disease/regions of haziness, 80% mid LAD disease  Left circumflex (LCx):  Large vessel with OM branch 2, no significant disease noted  Right coronary artery (RCA):  Right dominant vessel with PL and PDA, no significant disease noted  Left ventriculography: Left ventricular systolic function is normal, LVEF is estimated at 55-65%, there is no significant mitral regurgitation , no significant aortic valve stenosis  Final Conclusions:   Moderate ostial/proximal  LAD disease Severe mid LAD disease Normal ejection fraction  Recommendations:  Difficult decision whether to proceed with aggressive intervention Recent negative stress test, symptoms with some typical and atypical features She is relatively immobile, she does report some dysphagia symptoms, unclear if she is appreciating more GI symptoms Recommend starting isosorbide 30 mg daily Images discussed with Dr. Okey Dupre -We will need to consider PCI of the mid LAD, consider FFR of ostial LAD if she continues to have anginal symptoms.  Would recommend this be done at [facility], consider radial access given right groin access very difficult __________   PCI 12/2019: Suezanne Jacquet LAD to Prox LAD lesion is 80% stenosed.  Post intervention, there is a 0% residual stenosis. Post intervention, there is a 0% residual stenosis. A drug-eluting stent was successfully placed using a STENT RESOLUTE ONYX 3.5X12. Mid LAD lesion is 80% stenosed. A drug-eluting stent was successfully placed using a STENT RESOLUTE ONYX [id number].   Successful PCI and drug-eluting stent placement to mid as well as ostial LAD.   Recommendations: Dual antiplatelet therapy for at least 6 months but should consider long-term dual antiplatelet therapy given that the ostial LAD stent extends a millimeter into the left main coronary artery Aggressive treatment of risk factors.  Patient Profile     77 y.o. female with history of CAD status post PCI to the ostial and mid LAD in the setting of unstable angina in 12/2019, HFpEF, persistent A-fib diagnosed in 09/2022 status post successful DCCV in 10/2022 on apixaban, DM2, CKD stage III, HTN, HLD, iron deficiency anemia following gastric bypass x 2, IBS, esophageal stenosis, chronic dizziness with gait instability, cervicogenic headache with cervical spine stenosis, obesity, RLS, adrenal mass, recent diagnosis of pancreatic insufficiency, and OSA on CPAP who is being seen today for the evaluation of acute on  chronic HFpEF at the request of Dr. Huel Cote.   Assessment & Plan    1. Acute on chronic HFpEF: -Volume status is improving, though she remains volume up -Continue IV furosemide 40 mg twice daily with KCl repletion -Echo with preserved LVSF as above -Resume PTA Farxiga 10 mg -Has not been maintained on MRA in the setting of underlying renal dysfunction -Consider escalation of GDMT with addition of ARNI throughout admission or in hospital follow-up -Daily weights, strict I's and O's   2.  CAD involving the native coronary arteries with stable angina: -She reported intermittent chest pain at visit in the office yesterday which is likely in the setting of heart failure exacerbation -Ruled out -PTA apixaban in place of aspirin given persistent A-fib -No plans for inpatient ischemic testing at this time   3.  Persistent A-fib: -Maintaining sinus rhythm with PACs -PTA Toprol-XL 12.5 mg -CHA2DS2-VASc at least 7 (CHF, HTN, age x 2, DM, vascular disease, sex category) -Remains on apixaban 5 mg twice daily and does not meet reduced dosing criteria   4.  Rectal bleeding: -Reports frank BRBPR this morning -CBC pending -Monitor -Ongoing management of OAC per updated Hgb   5.  CKD stage III: -Monitor with diuresis   6.  Hypokalemia: -Replete to goal 4.0 -Unable to swallow KCl pills   7.  HTN: -Blood pressure well-controlled -Not requiring scheduled antihypertensive therapy PTA   8.  HLD: -LDL 45 in 10/2022 -Remains on atorvastatin 80 mg and ezetimibe 10 mg   9.  OSA: -CPAP  10. Adrenal mass: -Outpatient follow up with PCP     For questions or updates, please contact [facility] Please consult [email or web address] for contact info under Cardiology/STEMI.    Signed, Eula Listen, PA-C [facility] Pager: [phone] 03/30/2023, 10:25 AM

## 2023-03-30 NOTE — Progress Notes (Signed)
PROGRESS NOTE    [name]  ION:[id number] DOB: 04/25/1946 DOA: 03/28/2023 PCP: Sherlene Shams, MD    Brief Narrative:   From admission h and p  Denise Sharp is a 77 y.o. female with medical history significant of CAD s/p PCI to ostial and mid LAD (2021), HFpEF, atrial fibrillation on Eliquis, CKD stage IIIa, type 2 diabetes, iron deficiency anemia on IV iron infusions, gastric bypass x 2, esophageal stenosis s/p dilation, chronic dizziness with gait instability, cervicogenic headache, OSA on CPAP, restless leg syndrome, who presents to the ED due to leg swelling and rectal bleeding   Mrs.Denise Sharp states that over the last 1 month, she has been slowly gaining weight despite eating much less.  Her abdomen feels significantly distended.  She has has been experience also seeing creasing lower extremity edema, orthopnea, and dyspnea on exertion.  She is taking her Lasix 20 mg daily as prescribed without improvement.  She denies any chest pain or palpitations at this time.  She notes that in May, she weighed approximately 220 pounds and now she is 270.   [name] also endorses bright red blood per rectum that is been occurring for the last week.  She states that she finds bright red blood on her depends even when she has not had a bowel movement.  When she was walking to the toilet, she noted that she was having little droplets of blood and then there was blood in the commode as well.  She is uncertain if there is blood mixed into her stool.  She notes a history of hemorrhoids.  She endorses some abdominal cramps intermittently but no abdominal pain.  Assessment & Plan:   Principal Problem:   Acute on chronic heart failure with preserved ejection fraction (HFpEF) (HCC) Active Problems:   Rectal bleeding   CKD stage 3 due to type 2 diabetes mellitus (HCC)   Type II diabetes mellitus with neurological manifestations (HCC)   Essential hypertension   OSA on CPAP   Persistent  atrial fibrillation (HCC)   Iron deficiency anemia   Exocrine pancreatic insufficiency   CAD S/P percutaneous coronary angioplasty   Morbid obesity (HCC)   History of gastric bypass  * Acute on chronic heart failure with preserved ejection fraction (HFpEF) (HCC) Tte here ef 60-65, grade 2 dd.  RHC/LHC in November 2023 did not demonstrate any obstructive CAD.  Symptoms have been gradually worsening over the last month with significant hypervolemia on examination. Out 3.1 liters last 24 - Telemetry monitoring - Lasix 40 mg IV twice daily - Daily weights - Strict in and out - Daily BMP and magnesium - replete lytes as needed -- PT eval   Rectal bleeding, likely hemorrhoidal Patient reporting approximately 1 week of bright red blood per rectum in the setting of known hemorrhoidal disease. Given bleeding has occurred independently of bowel movements, likely hemorrhoids. Blood in bowl today but unclear how much bleeding there actually is. No bleeding external hemorrhoids on exam - Apply rectal suppository twice daily - monitor bleeding and hgb   CKD stage 3 due to type 2 diabetes mellitus (HCC) Over the last year, GFR has been primarily in the range of CKD stage IIIa.  Currently improved compared to baseline. 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  Currently on Farxiga, Humalog, Tresiba - SSI, moderate - Semglee 6 units at bedtime   Essential hypertension - Continue home antihypertensives   OSA on CPAP - CPAP at bedtime   Persistent atrial fibrillation (HCC) - Continue home Eliquis given rectal bleeding is likely hemorrhoidal - Holdin home  metoprolol in the setting of bradycardia   Iron deficiency anemia History of iron deficiency anemia, with recent infusion of Feraheme.  Iron panel obtained on 9/10 with normalized iron levels.   Exocrine pancreatic insufficiency Patient was recently diagnosed with exocrine pancreatic insufficiency with moderately reduced fecal elastase.  She has been started on Creon therapy.  - Continue home medications   CAD S/P percutaneous coronary angioplasty Patient has known intermittent chest pain which is relatively stable, and she follows up with cardiology.  LHC in November 2023 did not demonstrate any obstructive lesions. - Continue home regimen   DVT prophylaxis: apixaban Code Status: full Family Communication: none @ bedside  Level of care: Telemetry Cardiac Status is: Inpatient Remains inpatient appropriate because: need for IV diuresis    Consultants:  cardiology  Procedures: none  Antimicrobials:  none    Subjective: Reports breathing stable  Objective: Vitals:   03/30/23 0400 03/30/23 0500 03/30/23 0828 03/30/23 1141  BP: (!) 102/57  (!) 136/54 (!) 118/52  Pulse: 63  75 70  Resp: 18  18 18   Temp: 97.8 F (36.6 C)  98 F (36.7 C) 98.5 F (36.9 C)  TempSrc: Oral     SpO2: 94%  96% 93%  Weight:  115.6 kg    Height:        Intake/Output Summary (Last 24 hours) at 03/30/2023 1151 Last data filed at 03/30/2023 1031 Gross per 24 hour  Intake 570 ml  Output 3150 ml  Net -2580 ml   Filed Weights   03/28/23 2120 03/29/23 0701 03/30/23 0500  Weight: 122.3 kg 117.1 kg 115.6 kg    Examination:  General exam: Appears calm and comfortable  Respiratory system: rales at bases otherwise clear Cardiovascular system: S1 & S2 heard, RRR. No JVD, murmurs, rubs, gallops or clicks. N  Gastrointestinal system: Abdomen is obese, soft and nontender. No organomegaly or masses felt. Normal bowel sounds heard. Central nervous system: Alert and oriented. No focal neurological  deficits. Extremities: Symmetric 5 x 5 power. Mild LE edema Skin: No rashes, lesions or ulcers Psychiatry: Judgement and insight appear normal. Mood & affect appropriate.     Data Reviewed: I have personally reviewed following labs and imaging studies  CBC: Recent Labs  Lab 03/26/23 1430 03/28/23 1213 03/29/23 0443  WBC 12.0* 11.2* 11.1*  NEUTROABS 7.8*  --  6.9  HGB 11.3* 12.8 12.3  HCT 35.6* 41.7 37.7  MCV 92.2 96.5 91.7  PLT 264 280 290   Basic Metabolic Panel: Recent Labs  Lab 03/26/23 1430 03/28/23 1213 03/29/23 0443 03/29/23 1940 03/30/23 0457  NA 140 142 141  --  138  K 3.2* 3.0* 3.4*  --  3.5  CL 109 106 103  --  99  CO2 22 22 26   --  28  GLUCOSE 144* 102* 114*  --  150*  BUN 10 8 9   --  13  CREATININE 1.31* 1.03* 1.04*  --  0.99  CALCIUM 7.9* 8.5* 8.1*  --  8.2*  MG  --   --  1.8 2.0 2.2   GFR: Estimated Creatinine  Clearance: 59.4 mL/min (by C-G formula based on SCr of 0.99 mg/dL). Liver Function Tests: Recent Labs  Lab 03/26/23 1430 03/28/23 1213  AST 18 19  ALT 16 16  ALKPHOS 109 125  BILITOT 0.5 1.0  PROT 6.5 7.3  ALBUMIN 3.2* 3.6   No results for input(s): "LIPASE", "AMYLASE" in the last 168 hours. No results for input(s): "AMMONIA" in the last 168 hours. Coagulation Profile: No results for input(s): "INR", "PROTIME" in the last 168 hours. Cardiac Enzymes: No results for input(s): "CKTOTAL", "CKMB", "CKMBINDEX", "TROPONINI" in the last 168 hours. BNP (last 3 results) No results for input(s): "PROBNP" in the last 8760 hours. HbA1C: No results for input(s): "HGBA1C" in the last 72 hours. CBG: Recent Labs  Lab 03/29/23 1201 03/29/23 1655 03/29/23 2200 03/30/23 0830 03/30/23 1143  GLUCAP 169* 208* 173* 188* 247*   Lipid Profile: No results for input(s): "CHOL", "HDL", "LDLCALC", "TRIG", "CHOLHDL", "LDLDIRECT" in the last 72 hours. Thyroid Function Tests: No results for input(s): "TSH", "T4TOTAL", "FREET4", "T3FREE", "THYROIDAB" in  the last 72 hours. Anemia Panel: No results for input(s): "VITAMINB12", "FOLATE", "FERRITIN", "TIBC", "IRON", "RETICCTPCT" in the last 72 hours.  Urine analysis:    Component Value Date/Time   COLORURINE YELLOW 02/17/2021 1008   APPEARANCEUR Cloudy (A) 05/01/2022 1142   LABSPEC >=1.030 (A) 02/17/2021 1008   LABSPEC 1.006 06/21/2014 1623   PHURINE 5.0 02/17/2021 1008   GLUCOSEU 3+ (A) 05/01/2022 1142   GLUCOSEU 500 (A) 02/17/2021 1008   HGBUR NEGATIVE 02/17/2021 1008   BILIRUBINUR Negative 05/01/2022 1142   BILIRUBINUR Negative 06/21/2014 1623   KETONESUR NEGATIVE 02/17/2021 1008   PROTEINUR Negative 05/01/2022 1142   PROTEINUR NEGATIVE 02/08/2021 1323   UROBILINOGEN 0.2 02/17/2021 1008   NITRITE Negative 05/01/2022 1142   NITRITE NEGATIVE 02/17/2021 1008   LEUKOCYTESUR 1+ (A) 05/01/2022 1142   LEUKOCYTESUR SMALL (A) 02/17/2021 1008   LEUKOCYTESUR Negative 06/21/2014 1623   Sepsis Labs: @LABRCNTIP (procalcitonin:4,lacticidven:4)  )No results found for this or any previous visit (from the past 240 hour(s)).       Radiology Studies: CT ABDOMEN PELVIS W CONTRAST  Result Date: 03/29/2023 CLINICAL DATA:  Right abdominal pain EXAM: CT ABDOMEN AND PELVIS WITH CONTRAST TECHNIQUE: Multidetector CT imaging of the abdomen and pelvis was performed using the standard protocol following bolus administration of intravenous contrast. RADIATION DOSE REDUCTION: This exam was performed according to the departmental dose-optimization program which includes automated exposure control, adjustment of the mA and/or kV according to patient size and/or use of iterative reconstruction technique. CONTRAST:  OMNIPAQUE IOHEXOL 350 MG/ML SOLN COMPARISON:  None Available. FINDINGS: Lower chest: Small bilateral pleural effusions. Associated lower lobe atelectasis. Hepatobiliary: Liver is within normal limits. Status post cholecystectomy. No intrahepatic or extrahepatic dilatation. Pancreas: Within normal  limits. Spleen: Within normal limits. Adrenals/Urinary Tract: 17 mm left adrenal nodule (series 2/image 22), likely reflecting a benign adrenal adenoma, but indeterminate. Right adrenal gland is within normal limits. Kidneys are within normal limits, noting simple bilateral renal sinus cysts, benign (Bosniak I). No follow-up recommended. No hydronephrosis. Bladder is underdistended but unremarkable. Stomach/Bowel: Status post hiatal hernia repair. Status post gastric bypass with patent gastrojejunostomy. No evidence of bowel obstruction. Appendix is not discretely visualized. No colonic wall thickening or inflammatory changes. Vascular/Lymphatic: No evidence of abdominal aortic aneurysm. Atherosclerotic calcifications of the abdominal aorta and branch vessels, although vessels remain patent. No suspicious abdominopelvic lymphadenopathy. Reproductive: Status post hysterectomy. Bilateral ovaries are within normal limits. Other: No abdominopelvic ascites. Postsurgical changes along the  anterior abdominal wall. Musculoskeletal: Degenerative changes of the visualized thoracolumbar spine. Sacral stimulator. IMPRESSION: No CT findings to account for the patient's right abdominal pain. 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 Consider follow-up CT or MR abdomen with/without contrast in 1 year, as clinically warranted. Small bilateral pleural effusions. Electronically Signed   By: Charline Bills M.D.   On: 03/29/2023 22:55   ECHOCARDIOGRAM COMPLETE  Result Date: 03/29/2023    ECHOCARDIOGRAM REPORT   Patient Name:   Denise Sharp Date of Exam: 03/29/2023 Medical Rec #:  [id number]         Height:       64.0 in Accession #:    [id number]        Weight:       258.2 lb Date of Birth:  [birth year]         BSA:          2.181 m Patient Age:    77 years          BP:           100/60 mmHg Patient Gender: F                 HR:           79 bpm. Exam Location:  [facility] Procedure:  2D Echo, Color Doppler and Cardiac Doppler Indications:     CHF-acute diastolic  History:         Patient has prior history of Echocardiogram examinations, most                  recent 12/29/2000. Arrythmias:LBBB; Risk Factors:Hypertension.  Sonographer:     Cristela Blue Referring Phys:  [id number] Verdene Lennert Diagnosing Phys: Debbe Odea MD IMPRESSIONS  1. Left ventricular ejection fraction, by estimation, is 60 to 65%. The left ventricle has normal function. The left ventricle has no regional wall motion abnormalities. There is mild left ventricular hypertrophy. Left ventricular diastolic parameters are consistent with Grade II diastolic dysfunction (pseudonormalization).  2. Right ventricular systolic function is normal. The right ventricular size is normal.  3. Left atrial size was mild to moderately dilated.  4. Right atrial size was mildly dilated.  5. The mitral valve is degenerative. No evidence of mitral valve regurgitation.  6. The aortic valve was not well visualized. Aortic valve regurgitation is mild. FINDINGS  Left Ventricle: Left ventricular ejection fraction, by estimation, is 60 to 65%. The left ventricle has normal function. The left ventricle has no regional wall motion abnormalities. The left ventricular internal cavity size was normal in size. There is  mild left ventricular hypertrophy. Left ventricular diastolic parameters are consistent with Grade II diastolic dysfunction (pseudonormalization). Right Ventricle: The right ventricular size is normal. No increase in right ventricular wall thickness. Right ventricular systolic function is normal. Left Atrium: Left atrial size was mild to moderately dilated. Right Atrium: Right atrial size was mildly dilated. Pericardium: There is no evidence of pericardial effusion. Mitral Valve: The mitral valve is degenerative in appearance. Mild to moderate mitral annular calcification. No evidence of mitral valve regurgitation. MV peak gradient, 4.8 mmHg.  The mean mitral valve gradient is 2.0 mmHg. Tricuspid Valve: The tricuspid valve is normal in structure. Tricuspid valve regurgitation is not demonstrated. Aortic Valve: The aortic valve was not well visualized. Aortic valve regurgitation is mild. Aortic valve mean gradient measures 4.0 mmHg. Aortic valve peak gradient measures 7.0 mmHg. Aortic valve area, by VTI measures 3.30  cm. Pulmonic Valve: The pulmonic valve was not well visualized. Pulmonic valve regurgitation is not visualized. Aorta: The aortic root is normal in size and structure. Venous: The inferior vena cava was not well visualized. IAS/Shunts: No atrial level shunt detected by color flow Doppler.  LEFT VENTRICLE PLAX 2D LVIDd:         4.50 cm   Diastology LVIDs:         2.80 cm   LV e' medial:   6.42 cm/s LV PW:         1.80 cm   LV E/e' medial: 18.2 LV IVS:        1.10 cm LVOT diam:     2.00 cm LV SV:         79 LV SV Index:   36 LVOT Area:     3.14 cm  RIGHT VENTRICLE RV Basal diam:  4.00 cm RV Mid diam:    3.30 cm LEFT ATRIUM           Index        RIGHT ATRIUM           Index LA diam:      4.60 cm 2.11 cm/m   RA Area:     13.40 cm LA Vol (A2C): 40.6 ml 18.62 ml/m  RA Volume:   32.80 ml  15.04 ml/m LA Vol (A4C): 89.6 ml 41.09 ml/m  AORTIC VALVE AV Area (Vmax):    2.22 cm AV Area (Vmean):   2.14 cm AV Area (VTI):     3.30 cm AV Vmax:           132.00 cm/s AV Vmean:          98.800 cm/s AV VTI:            0.239 m AV Peak Grad:      7.0 mmHg AV Mean Grad:      4.0 mmHg LVOT Vmax:         93.40 cm/s LVOT Vmean:        67.200 cm/s LVOT VTI:          0.251 m LVOT/AV VTI ratio: 1.05  AORTA Ao Root diam: 2.80 cm MITRAL VALVE MV Area (PHT): 2.94 cm     SHUNTS MV Area VTI:   2.06 cm     Systemic VTI:  0.25 m MV Peak grad:  4.8 mmHg     Systemic Diam: 2.00 cm MV Mean grad:  2.0 mmHg MV Vmax:       1.10 m/s MV Vmean:      57.2 cm/s MV Decel Time: 258 msec MV E velocity: 117.00 cm/s MV A velocity: 80.20 cm/s MV E/A ratio:  1.46 Debbe Odea MD  Electronically signed by Debbe Odea MD Signature Date/Time: 03/29/2023/3:30:39 PM    Final    DG Chest 1 View  Result Date: 03/28/2023 CLINICAL DATA:  Shortness of breath EXAM: CHEST  1 VIEW COMPARISON:  12/13/2022 FINDINGS: Cardiomegaly with vascular congestion and diffuse increased interstitial opacities suspicious for edema. Probable small pleural effusions. No pneumothorax. Right shoulder replacement. IMPRESSION: Cardiomegaly with vascular congestion and probable interstitial edema. Possible small pleural effusions Electronically Signed   By: Jasmine Pang M.D.   On: 03/28/2023 15:48        Scheduled Meds:  apixaban  5 mg Oral BID   atorvastatin  80 mg Oral Daily   celecoxib  100 mg Oral Daily   dapagliflozin propanediol  10 mg Oral Daily   DULoxetine  30 mg Oral Daily   ezetimibe  10 mg Oral Daily   furosemide  40 mg Intravenous BID   hydrocortisone  25 mg Rectal BID   insulin aspart  0-15 Units Subcutaneous TID WC   insulin glargine-yfgn  6 Units Subcutaneous QHS   lipase/protease/amylase  72,000 Units Oral TID AC   metoprolol succinate  12.5 mg Oral Daily   mometasone-formoterol  2 puff Inhalation BID   pantoprazole  40 mg Oral Daily   potassium chloride  40 mEq Oral Q4H   pregabalin  50 mg Oral BID   sodium chloride flush  3 mL Intravenous Q12H   sucralfate  1 g Oral TID AC   Continuous Infusions:  sodium chloride       LOS: 2 days     Silvano Bilis, MD [facility]   If 7PM-7AM, please contact night-coverage [email or web address] Password [facility] 03/30/2023, 11:51 AM

## 2023-03-30 NOTE — Evaluation (Signed)
Physical Therapy Evaluation Patient Details Name: LAQUONDA SHAWN MRN: [id number] DOB: October 13, 1945 Today's Date: 03/30/2023  History of Present Illness  77 y.o. female with medical history significant of CAD s/p PCI to ostial and mid LAD (2021), HFpEF, atrial fibrillation on Eliquis, CKD stage IIIa, type 2 diabetes, iron deficiency anemia on IV iron infusions, gastric bypass x 2, esophageal stenosis s/p dilation, chronic dizziness with gait instability, cervicogenic headache, OSA on CPAP, restless leg syndrome, who presents to the ED due to leg swelling and rectal bleeding. Current MD assessment: Acute on chronic heart failure with preserved ejection fraction, rectal bleeding, CKD, a-fib, and abdominal pain.  Clinical Impression  Pt was pleasant and motivated to participate during the session and put forth good effort throughout. Pt found supine in bed with family (husband) in room. Pt is currently min A through very light HHA to sit upright. She is able to perform STS transfers at supervision level from both EOB and standard height toilet, no cues needing for sequencing. Pt able to ambulate with RW and CGA to use toilet, then back to recliner. Pt is reliant on RW for safe ambulation, with slight forward flexed posture for support. Per pt reports feeling slightly weaker than normal but better than initial admission to hospital; husband agrees. Pt will benefit from continued PT services upon discharge to safely address deficits listed in patient problem list for decreased caregiver assistance and eventual return to PLOF.         If plan is discharge home, recommend the following: A little help with walking and/or transfers;A little help with bathing/dressing/bathroom;Assistance with cooking/housework;Assist for transportation;Help with stairs or ramp for entrance   Can travel by private vehicle        Equipment Recommendations Rolling walker (2 wheels) (Pt's current RW is ~77 years old, wheels are  not functioning correctly)  Recommendations for Other Services       Functional Status Assessment Patient has had a recent decline in their functional status and demonstrates the ability to make significant improvements in function in a reasonable and predictable amount of time.     Precautions / Restrictions Precautions Precautions: Fall Restrictions Weight Bearing Restrictions: No      Mobility  Bed Mobility Overal bed mobility: Needs Assistance Bed Mobility: Supine to Sit     Supine to sit: Min assist     General bed mobility comments: Pt needed min A via HHA from relatively flat bed surface    Transfers Overall transfer level: Needs assistance Equipment used: Rolling walker (2 wheels) Transfers: Sit to/from Stand Sit to Stand: Supervision           General transfer comment: performed x2 STS from EOB and from standard commode height.    Ambulation/Gait Ambulation/Gait assistance: Contact guard assist Gait Distance (Feet): 15 Feet x2 Assistive device: Rolling walker (2 wheels) Gait Pattern/deviations: Step-through pattern, Trunk flexed Gait velocity: decreased     General Gait Details: steady steps taken, reliant on RW with trunk flexed  Stairs            Wheelchair Mobility     Tilt Bed    Modified Rankin (Stroke Patients Only)       Balance Overall balance assessment: Needs assistance Sitting-balance support: Feet supported Sitting balance-Leahy Scale: Good     Standing balance support: Reliant on assistive device for balance, During functional activity, Bilateral upper extremity supported Standing balance-Leahy Scale: Fair Standing balance comment: static standing with RW  Pertinent Vitals/Pain Pain Assessment Pain Assessment: Faces Faces Pain Scale: No hurt    Home Living Family/patient expects to be discharged to:: Private residence Living Arrangements: Spouse/significant other Available  Help at Discharge: Family;Available 24 hours/day Type of Home: House Home Access: Ramped entrance       Home Layout: One level Home Equipment: Agricultural consultant (2 wheels);Electric scooter;Shower seat;Cane - single point;Rollator (4 wheels)      Prior Function Prior Level of Function : Needs assist             Mobility Comments: Able to walk short distances in house with RW. Uses eletric scooter in community. ADLs Comments: has assist from husband at baseline; He performs majority of IADLs.     Extremity/Trunk Assessment   Upper Extremity Assessment Upper Extremity Assessment: Overall WFL for tasks assessed    Lower Extremity Assessment Lower Extremity Assessment: Overall WFL for tasks assessed       Communication   Communication Communication: No apparent difficulties  Cognition Arousal: Alert Behavior During Therapy: WFL for tasks assessed/performed Overall Cognitive Status: Within Functional Limits for tasks assessed                                          General Comments      Exercises     Assessment/Plan    PT Assessment Patient needs continued PT services  PT Problem List Decreased strength;Decreased coordination;Decreased range of motion;Decreased activity tolerance;Decreased balance;Decreased mobility       PT Treatment Interventions DME instruction;Balance training;Gait training;Stair training;Functional mobility training;Therapeutic activities;Therapeutic exercise    PT Goals (Current goals can be found in the Care Plan section)  Acute Rehab PT Goals Patient Stated Goal: become more independent PT Goal Formulation: With patient Time For Goal Achievement: 04/12/23 Potential to Achieve Goals: Fair    Frequency Min 1X/week     Co-evaluation               AM-PAC PT "6 Clicks" Mobility  Outcome Measure Help needed turning from your back to your side while in a flat bed without using bedrails?: A Little Help needed  moving from lying on your back to sitting on the side of a flat bed without using bedrails?: A Little Help needed moving to and from a bed to a chair (including a wheelchair)?: A Little Help needed standing up from a chair using your arms (e.g., wheelchair or bedside chair)?: A Little Help needed to walk in hospital room?: A Little Help needed climbing 3-5 steps with a railing? : A Lot 6 Click Score: 17    End of Session Equipment Utilized During Treatment: Gait belt Activity Tolerance: Patient tolerated treatment well Patient left: in chair;with call bell/phone within reach;with chair alarm set;with family/visitor present Nurse Communication: Mobility status PT Visit Diagnosis: Difficulty in walking, not elsewhere classified (R26.2);Muscle weakness (generalized) (M62.81)    Time: 1610-9604 PT Time Calculation (min) (ACUTE ONLY): 24 min   Charges:                 Cecile Sheerer, SPT 03/30/23, 4:04 PM

## 2023-03-30 NOTE — Plan of Care (Signed)

## 2023-03-30 NOTE — Progress Notes (Signed)
PHARMACY CONSULT NOTE - ELECTROLYTES  Pharmacy Consult for Electrolyte Monitoring and Replacement   Recent Labs: Height: 5\' 4"  (162.6 cm) Weight: 115.6 kg (254 lb 13.6 oz) IBW/kg (Calculated) : 54.7 Estimated Creatinine Clearance: 59.4 mL/min (by C-G formula based on SCr of 0.99 mg/dL). Potassium (mmol/L)  Date Value  03/30/2023 3.5  06/21/2014 4.0   Magnesium (mg/dL)  Date Value  28/41/3244 2.2  08/05/2012 1.5 (L)   Calcium (mg/dL)  Date Value  07/18/7251 8.2 (L)   Calcium, Total (mg/dL)  Date Value  66/44/0347 8.7   Albumin (g/dL)  Date Value  42/59/5638 3.6  10/30/2012 3.0 (L)   Sodium (mmol/L)  Date Value  03/30/2023 138  03/05/2023 142  06/21/2014 141   Corrected Ca: 8.5 mg/dL  Assessment  Denise Sharp is a 77 y.o. female presenting with shortness of breath and rectal bleeding. PMH significant for AF,T2DM, HTN, IBS, OSA. Pharmacy has been consulted to monitor and replace electrolytes.  Diet: regular Pertinent medications: furosemide 40 mg IV BID  Goal of Therapy:  Potassium 4.0 - 5.1 mmol/L Magnesium 2.0 - 2.4 mg/dL All Other Electrolytes WNL  Plan:  KCl 40 mEq PO x1 Check BMP and Mg with AM labs  Thank you for allowing pharmacy to be a part of this patient's care.  Lowella Bandy, PharmD Clinical Pharmacist 03/30/2023 10:48 AM

## 2023-03-31 DIAGNOSIS — I5033 Acute on chronic diastolic (congestive) heart failure: Secondary | ICD-10-CM | POA: Diagnosis not present

## 2023-03-31 DIAGNOSIS — I2510 Atherosclerotic heart disease of native coronary artery without angina pectoris: Secondary | ICD-10-CM | POA: Diagnosis not present

## 2023-03-31 DIAGNOSIS — Z9861 Coronary angioplasty status: Secondary | ICD-10-CM | POA: Diagnosis not present

## 2023-03-31 LAB — BASIC METABOLIC PANEL
Anion gap: 13 (ref 5–15)
BUN: 20 mg/dL (ref 8–23)
CO2: 29 mmol/L (ref 22–32)
Calcium: 8.7 mg/dL — ABNORMAL LOW (ref 8.9–10.3)
Chloride: 98 mmol/L (ref 98–111)
Creatinine, Ser: 1.21 mg/dL — ABNORMAL HIGH (ref 0.44–1.00)
GFR, Estimated: 46 mL/min — ABNORMAL LOW (ref >60)
Glucose, Bld: 213 mg/dL — ABNORMAL HIGH (ref 70–99)
Potassium: 3.8 mmol/L (ref 3.5–5.1)
Sodium: 140 mmol/L (ref 135–145)

## 2023-03-31 LAB — CBC
HCT: 38.3 % (ref 36.0–46.0)
Hemoglobin: 12.5 g/dL (ref 12.0–15.0)
MCH: 29.4 pg (ref 26.0–34.0)
MCHC: 32.6 g/dL (ref 30.0–36.0)
MCV: 90.1 fL (ref 80.0–100.0)
Platelets: 308 10*3/uL (ref 150–400)
RBC: 4.25 MIL/uL (ref 3.87–5.11)
RDW: 15.5 % (ref 11.5–15.5)
WBC: 11.3 10*3/uL — ABNORMAL HIGH (ref 4.0–10.5)
nRBC: 0 % (ref 0.0–0.2)

## 2023-03-31 LAB — GLUCOSE, CAPILLARY
Glucose-Capillary: 172 mg/dL — ABNORMAL HIGH (ref 70–99)
Glucose-Capillary: 223 mg/dL — ABNORMAL HIGH (ref 70–99)
Glucose-Capillary: 194 mg/dL — ABNORMAL HIGH (ref 70–99)
Glucose-Capillary: 271 mg/dL — ABNORMAL HIGH (ref 70–99)

## 2023-03-31 LAB — MAGNESIUM: Magnesium: 2.1 mg/dL (ref 1.7–2.4)

## 2023-03-31 MED ORDER — APIXABAN 5 MG PO TABS
5.0000 mg | ORAL_TABLET | Freq: Two times a day (BID) | ORAL | Status: DC
  Administered 2023-03-31 – 2023-04-01 (×2): 5 mg via ORAL
  Filled 2023-03-31 (×2): qty 1

## 2023-03-31 MED ORDER — TORSEMIDE 20 MG PO TABS
20.0000 mg | ORAL_TABLET | Freq: Every day | ORAL | Status: DC
  Administered 2023-03-31 – 2023-04-01 (×2): 20 mg via ORAL
  Filled 2023-03-31 (×2): qty 1

## 2023-03-31 MED ORDER — POTASSIUM CHLORIDE 20 MEQ PO PACK
40.0000 meq | PACK | Freq: Once | ORAL | Status: AC
  Administered 2023-03-31: 40 meq via ORAL
  Filled 2023-03-31: qty 2

## 2023-03-31 NOTE — Plan of Care (Signed)
  Problem: Education: Goal: Understanding of CV disease, CV risk reduction, and recovery process will improve Outcome: Progressing   Problem: Activity: Goal: Ability to return to baseline activity level will improve Outcome: Progressing   Problem: Cardiovascular: Goal: Ability to achieve and maintain adequate cardiovascular perfusion will improve Outcome: Progressing   Problem: Health Behavior/Discharge Planning: Goal: Ability to safely manage health-related needs after discharge will improve Outcome: Progressing   Problem: Education: Goal: Ability to describe self-care measures that may prevent or decrease complications (Diabetes Survival Skills Education) will improve Outcome: Progressing   Problem: Fluid Volume: Goal: Ability to maintain a balanced intake and output will improve Outcome: Progressing   Problem: Metabolic: Goal: Ability to maintain appropriate glucose levels will improve Outcome: Progressing   Problem: Nutritional: Goal: Maintenance of adequate nutrition will improve Outcome: Progressing Goal: Progress toward achieving an optimal weight will improve Outcome: Progressing   Problem: Tissue Perfusion: Goal: Adequacy of tissue perfusion will improve Outcome: Progressing   Problem: Clinical Measurements: Goal: Will remain free from infection Outcome: Progressing Goal: Diagnostic test results will improve Outcome: Progressing   Problem: Activity: Goal: Risk for activity intolerance will decrease Outcome: Progressing   Problem: Safety: Goal: Ability to remain free from injury will improve Outcome: Progressing

## 2023-03-31 NOTE — Progress Notes (Signed)
PROGRESS NOTE    [name]  WUJ:[id number] DOB: 08/07/1945 DOA: 03/28/2023 PCP: Sherlene Shams, MD    Brief Narrative:   From admission h and p  Denise Sharp is a 77 y.o. female with medical history significant of CAD s/p PCI to ostial and mid LAD (2021), HFpEF, atrial fibrillation on Eliquis, CKD stage IIIa, type 2 diabetes, iron deficiency anemia on IV iron infusions, gastric bypass x 2, esophageal stenosis s/p dilation, chronic dizziness with gait instability, cervicogenic headache, OSA on CPAP, restless leg syndrome, who presents to the ED due to leg swelling and rectal bleeding   Denise Sharp states that over the last 1 month, she has been slowly gaining weight despite eating much less.  Her abdomen feels significantly distended.  She has has been experience also seeing creasing lower extremity edema, orthopnea, and dyspnea on exertion.  She is taking her Lasix 20 mg daily as prescribed without improvement.  She denies any chest pain or palpitations at this time.  She notes that in May, she weighed approximately 220 pounds and now she is 270.   Denise Sharp also endorses bright red blood per rectum that is been occurring for the last week.  She states that she finds bright red blood on her depends even when she has not had a bowel movement.  When she was walking to the toilet, she noted that she was having little droplets of blood and then there was blood in the commode as well.  She is uncertain if there is blood mixed into her stool.  She notes a history of hemorrhoids.  She endorses some abdominal cramps intermittently but no abdominal pain.  Assessment & Plan:   Principal Problem:   Acute on chronic heart failure with preserved ejection fraction (HFpEF) (HCC) Active Problems:   Rectal bleeding   CKD stage 3 due to type 2 diabetes mellitus (HCC)   Type II diabetes mellitus with neurological manifestations (HCC)   Essential hypertension   OSA on CPAP   Persistent  atrial fibrillation (HCC)   Iron deficiency anemia   Exocrine pancreatic insufficiency   CAD S/P percutaneous coronary angioplasty   Morbid obesity (HCC)   History of gastric bypass  * Acute on chronic heart failure with preserved ejection fraction (HFpEF) (HCC) Tte here ef 60-65, grade 2 dd.  RHC/LHC in November 2023 did not demonstrate any obstructive CAD.  Symptoms have been gradually worsening over the last month with significant hypervolemia on examination. Out 3.6 liters last 24 - Telemetry monitoring - Lasix 40 mg IV twice daily now converted to torsemide, cardiology advises d/c tomorrow if stable - Daily weights - Strict in and out - Daily BMP and magnesium - replete lytes as needed -- PT eval   Rectal bleeding, likely hemorrhoidal Patient reporting approximately 1 week of bright red blood per rectum in the setting of known hemorrhoidal disease. Given bleeding has occurred independently of bowel movements, likely hemorrhoids. Blood in bowl today but unclear how much bleeding there actually is. No bleeding external hemorrhoids on exam. Stool x2 today and no bleeding. Given some abd pain this hospitalization colonic ischemia also possible. Discussed w/ dr Allegra Lai on 9/14, she advises supportive care, f/u if bleeding worsens, hgb drops, etc - continue rectal suppository twice daily - re-start apixaban   CKD stage 3 due to type 2 diabetes mellitus (HCC) Over the last year, GFR has been primarily in the range of CKD stage IIIa.  Currently improved compared to baseline. - Daily  PROGRESS NOTE    [name]  WUJ:[id number] DOB: 08/07/1945 DOA: 03/28/2023 PCP: Sherlene Shams, MD    Brief Narrative:   From admission h and p  Denise Sharp is a 77 y.o. female with medical history significant of CAD s/p PCI to ostial and mid LAD (2021), HFpEF, atrial fibrillation on Eliquis, CKD stage IIIa, type 2 diabetes, iron deficiency anemia on IV iron infusions, gastric bypass x 2, esophageal stenosis s/p dilation, chronic dizziness with gait instability, cervicogenic headache, OSA on CPAP, restless leg syndrome, who presents to the ED due to leg swelling and rectal bleeding   Denise Sharp states that over the last 1 month, she has been slowly gaining weight despite eating much less.  Her abdomen feels significantly distended.  She has has been experience also seeing creasing lower extremity edema, orthopnea, and dyspnea on exertion.  She is taking her Lasix 20 mg daily as prescribed without improvement.  She denies any chest pain or palpitations at this time.  She notes that in May, she weighed approximately 220 pounds and now she is 270.   Denise Sharp also endorses bright red blood per rectum that is been occurring for the last week.  She states that she finds bright red blood on her depends even when she has not had a bowel movement.  When she was walking to the toilet, she noted that she was having little droplets of blood and then there was blood in the commode as well.  She is uncertain if there is blood mixed into her stool.  She notes a history of hemorrhoids.  She endorses some abdominal cramps intermittently but no abdominal pain.  Assessment & Plan:   Principal Problem:   Acute on chronic heart failure with preserved ejection fraction (HFpEF) (HCC) Active Problems:   Rectal bleeding   CKD stage 3 due to type 2 diabetes mellitus (HCC)   Type II diabetes mellitus with neurological manifestations (HCC)   Essential hypertension   OSA on CPAP   Persistent  atrial fibrillation (HCC)   Iron deficiency anemia   Exocrine pancreatic insufficiency   CAD S/P percutaneous coronary angioplasty   Morbid obesity (HCC)   History of gastric bypass  * Acute on chronic heart failure with preserved ejection fraction (HFpEF) (HCC) Tte here ef 60-65, grade 2 dd.  RHC/LHC in November 2023 did not demonstrate any obstructive CAD.  Symptoms have been gradually worsening over the last month with significant hypervolemia on examination. Out 3.6 liters last 24 - Telemetry monitoring - Lasix 40 mg IV twice daily now converted to torsemide, cardiology advises d/c tomorrow if stable - Daily weights - Strict in and out - Daily BMP and magnesium - replete lytes as needed -- PT eval   Rectal bleeding, likely hemorrhoidal Patient reporting approximately 1 week of bright red blood per rectum in the setting of known hemorrhoidal disease. Given bleeding has occurred independently of bowel movements, likely hemorrhoids. Blood in bowl today but unclear how much bleeding there actually is. No bleeding external hemorrhoids on exam. Stool x2 today and no bleeding. Given some abd pain this hospitalization colonic ischemia also possible. Discussed w/ dr Allegra Lai on 9/14, she advises supportive care, f/u if bleeding worsens, hgb drops, etc - continue rectal suppository twice daily - re-start apixaban   CKD stage 3 due to type 2 diabetes mellitus (HCC) Over the last year, GFR has been primarily in the range of CKD stage IIIa.  Currently improved compared to baseline. - Daily  hydronephrosis. Bladder is underdistended but unremarkable. Stomach/Bowel: Status post hiatal hernia repair. Status post gastric bypass with patent gastrojejunostomy. No evidence of bowel obstruction. Appendix is not discretely visualized. No colonic wall thickening or inflammatory changes. Vascular/Lymphatic: No evidence of abdominal aortic aneurysm. Atherosclerotic calcifications of the abdominal aorta and branch vessels, although vessels remain patent. No suspicious abdominopelvic lymphadenopathy. Reproductive: Status post hysterectomy. Bilateral ovaries are within normal limits. Other: No abdominopelvic ascites. Postsurgical changes along the anterior abdominal wall. Musculoskeletal: Degenerative changes of the visualized thoracolumbar spine. Sacral stimulator. IMPRESSION: No CT findings to account for the patient's right abdominal pain. 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 Consider follow-up CT or MR abdomen with/without contrast in 1 year, as clinically warranted. Small bilateral pleural effusions. Electronically Signed   By: Charline Bills M.D.   On: 03/29/2023 22:55    ECHOCARDIOGRAM COMPLETE  Result Date: 03/29/2023    ECHOCARDIOGRAM REPORT   Patient Name:   Denise Sharp Date of Exam: 03/29/2023 Medical Rec #:  [id number]         Height:       64.0 in Accession #:    [id number]        Weight:       258.2 lb Date of Birth:  08-30-1945         BSA:          2.181 m Patient Age:    77 years          BP:           100/60 mmHg Patient Gender: F                 HR:           79 bpm. Exam Location:  [facility] Procedure: 2D Echo, Color Doppler and Cardiac Doppler Indications:     CHF-acute diastolic  History:         Patient has prior history of Echocardiogram examinations, most                  recent 12/29/2000. Arrythmias:LBBB; Risk Factors:Hypertension.  Sonographer:     Cristela Blue Referring Phys:  [id number] Verdene Lennert Diagnosing Phys: Debbe Odea MD IMPRESSIONS  1. Left ventricular ejection fraction, by estimation, is 60 to 65%. The left ventricle has normal function. The left ventricle has no regional wall motion abnormalities. There is mild left ventricular hypertrophy. Left ventricular diastolic parameters are consistent with Grade II diastolic dysfunction (pseudonormalization).  2. Right ventricular systolic function is normal. The right ventricular size is normal.  3. Left atrial size was mild to moderately dilated.  4. Right atrial size was mildly dilated.  5. The mitral valve is degenerative. No evidence of mitral valve regurgitation.  6. The aortic valve was not well visualized. Aortic valve regurgitation is mild. FINDINGS  Left Ventricle: Left ventricular ejection fraction, by estimation, is 60 to 65%. The left ventricle has normal function. The left ventricle has no regional wall motion abnormalities. The left ventricular internal cavity size was normal in size. There is  mild left ventricular hypertrophy. Left ventricular diastolic parameters are consistent with Grade II diastolic dysfunction (pseudonormalization). Right Ventricle: The right ventricular size is  normal. No increase in right ventricular wall thickness. Right ventricular systolic function is normal. Left Atrium: Left atrial size was mild to moderately dilated. Right Atrium: Right atrial size was mildly dilated. Pericardium: There is no evidence of pericardial effusion. Mitral Valve: The mitral valve is  hydronephrosis. Bladder is underdistended but unremarkable. Stomach/Bowel: Status post hiatal hernia repair. Status post gastric bypass with patent gastrojejunostomy. No evidence of bowel obstruction. Appendix is not discretely visualized. No colonic wall thickening or inflammatory changes. Vascular/Lymphatic: No evidence of abdominal aortic aneurysm. Atherosclerotic calcifications of the abdominal aorta and branch vessels, although vessels remain patent. No suspicious abdominopelvic lymphadenopathy. Reproductive: Status post hysterectomy. Bilateral ovaries are within normal limits. Other: No abdominopelvic ascites. Postsurgical changes along the anterior abdominal wall. Musculoskeletal: Degenerative changes of the visualized thoracolumbar spine. Sacral stimulator. IMPRESSION: No CT findings to account for the patient's right abdominal pain. 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 Consider follow-up CT or MR abdomen with/without contrast in 1 year, as clinically warranted. Small bilateral pleural effusions. Electronically Signed   By: Charline Bills M.D.   On: 03/29/2023 22:55    ECHOCARDIOGRAM COMPLETE  Result Date: 03/29/2023    ECHOCARDIOGRAM REPORT   Patient Name:   Denise Sharp Date of Exam: 03/29/2023 Medical Rec #:  [id number]         Height:       64.0 in Accession #:    [id number]        Weight:       258.2 lb Date of Birth:  08-30-1945         BSA:          2.181 m Patient Age:    77 years          BP:           100/60 mmHg Patient Gender: F                 HR:           79 bpm. Exam Location:  [facility] Procedure: 2D Echo, Color Doppler and Cardiac Doppler Indications:     CHF-acute diastolic  History:         Patient has prior history of Echocardiogram examinations, most                  recent 12/29/2000. Arrythmias:LBBB; Risk Factors:Hypertension.  Sonographer:     Cristela Blue Referring Phys:  [id number] Verdene Lennert Diagnosing Phys: Debbe Odea MD IMPRESSIONS  1. Left ventricular ejection fraction, by estimation, is 60 to 65%. The left ventricle has normal function. The left ventricle has no regional wall motion abnormalities. There is mild left ventricular hypertrophy. Left ventricular diastolic parameters are consistent with Grade II diastolic dysfunction (pseudonormalization).  2. Right ventricular systolic function is normal. The right ventricular size is normal.  3. Left atrial size was mild to moderately dilated.  4. Right atrial size was mildly dilated.  5. The mitral valve is degenerative. No evidence of mitral valve regurgitation.  6. The aortic valve was not well visualized. Aortic valve regurgitation is mild. FINDINGS  Left Ventricle: Left ventricular ejection fraction, by estimation, is 60 to 65%. The left ventricle has normal function. The left ventricle has no regional wall motion abnormalities. The left ventricular internal cavity size was normal in size. There is  mild left ventricular hypertrophy. Left ventricular diastolic parameters are consistent with Grade II diastolic dysfunction (pseudonormalization). Right Ventricle: The right ventricular size is  normal. No increase in right ventricular wall thickness. Right ventricular systolic function is normal. Left Atrium: Left atrial size was mild to moderately dilated. Right Atrium: Right atrial size was mildly dilated. Pericardium: There is no evidence of pericardial effusion. Mitral Valve: The mitral valve is  hydronephrosis. Bladder is underdistended but unremarkable. Stomach/Bowel: Status post hiatal hernia repair. Status post gastric bypass with patent gastrojejunostomy. No evidence of bowel obstruction. Appendix is not discretely visualized. No colonic wall thickening or inflammatory changes. Vascular/Lymphatic: No evidence of abdominal aortic aneurysm. Atherosclerotic calcifications of the abdominal aorta and branch vessels, although vessels remain patent. No suspicious abdominopelvic lymphadenopathy. Reproductive: Status post hysterectomy. Bilateral ovaries are within normal limits. Other: No abdominopelvic ascites. Postsurgical changes along the anterior abdominal wall. Musculoskeletal: Degenerative changes of the visualized thoracolumbar spine. Sacral stimulator. IMPRESSION: No CT findings to account for the patient's right abdominal pain. 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 Consider follow-up CT or MR abdomen with/without contrast in 1 year, as clinically warranted. Small bilateral pleural effusions. Electronically Signed   By: Charline Bills M.D.   On: 03/29/2023 22:55    ECHOCARDIOGRAM COMPLETE  Result Date: 03/29/2023    ECHOCARDIOGRAM REPORT   Patient Name:   Denise Sharp Date of Exam: 03/29/2023 Medical Rec #:  [id number]         Height:       64.0 in Accession #:    [id number]        Weight:       258.2 lb Date of Birth:  08-30-1945         BSA:          2.181 m Patient Age:    77 years          BP:           100/60 mmHg Patient Gender: F                 HR:           79 bpm. Exam Location:  [facility] Procedure: 2D Echo, Color Doppler and Cardiac Doppler Indications:     CHF-acute diastolic  History:         Patient has prior history of Echocardiogram examinations, most                  recent 12/29/2000. Arrythmias:LBBB; Risk Factors:Hypertension.  Sonographer:     Cristela Blue Referring Phys:  [id number] Verdene Lennert Diagnosing Phys: Debbe Odea MD IMPRESSIONS  1. Left ventricular ejection fraction, by estimation, is 60 to 65%. The left ventricle has normal function. The left ventricle has no regional wall motion abnormalities. There is mild left ventricular hypertrophy. Left ventricular diastolic parameters are consistent with Grade II diastolic dysfunction (pseudonormalization).  2. Right ventricular systolic function is normal. The right ventricular size is normal.  3. Left atrial size was mild to moderately dilated.  4. Right atrial size was mildly dilated.  5. The mitral valve is degenerative. No evidence of mitral valve regurgitation.  6. The aortic valve was not well visualized. Aortic valve regurgitation is mild. FINDINGS  Left Ventricle: Left ventricular ejection fraction, by estimation, is 60 to 65%. The left ventricle has normal function. The left ventricle has no regional wall motion abnormalities. The left ventricular internal cavity size was normal in size. There is  mild left ventricular hypertrophy. Left ventricular diastolic parameters are consistent with Grade II diastolic dysfunction (pseudonormalization). Right Ventricle: The right ventricular size is  normal. No increase in right ventricular wall thickness. Right ventricular systolic function is normal. Left Atrium: Left atrial size was mild to moderately dilated. Right Atrium: Right atrial size was mildly dilated. Pericardium: There is no evidence of pericardial effusion. Mitral Valve: The mitral valve is  PROGRESS NOTE    [name]  WUJ:[id number] DOB: 08/07/1945 DOA: 03/28/2023 PCP: Sherlene Shams, MD    Brief Narrative:   From admission h and p  Denise Sharp is a 77 y.o. female with medical history significant of CAD s/p PCI to ostial and mid LAD (2021), HFpEF, atrial fibrillation on Eliquis, CKD stage IIIa, type 2 diabetes, iron deficiency anemia on IV iron infusions, gastric bypass x 2, esophageal stenosis s/p dilation, chronic dizziness with gait instability, cervicogenic headache, OSA on CPAP, restless leg syndrome, who presents to the ED due to leg swelling and rectal bleeding   Denise Sharp states that over the last 1 month, she has been slowly gaining weight despite eating much less.  Her abdomen feels significantly distended.  She has has been experience also seeing creasing lower extremity edema, orthopnea, and dyspnea on exertion.  She is taking her Lasix 20 mg daily as prescribed without improvement.  She denies any chest pain or palpitations at this time.  She notes that in May, she weighed approximately 220 pounds and now she is 270.   Denise Sharp also endorses bright red blood per rectum that is been occurring for the last week.  She states that she finds bright red blood on her depends even when she has not had a bowel movement.  When she was walking to the toilet, she noted that she was having little droplets of blood and then there was blood in the commode as well.  She is uncertain if there is blood mixed into her stool.  She notes a history of hemorrhoids.  She endorses some abdominal cramps intermittently but no abdominal pain.  Assessment & Plan:   Principal Problem:   Acute on chronic heart failure with preserved ejection fraction (HFpEF) (HCC) Active Problems:   Rectal bleeding   CKD stage 3 due to type 2 diabetes mellitus (HCC)   Type II diabetes mellitus with neurological manifestations (HCC)   Essential hypertension   OSA on CPAP   Persistent  atrial fibrillation (HCC)   Iron deficiency anemia   Exocrine pancreatic insufficiency   CAD S/P percutaneous coronary angioplasty   Morbid obesity (HCC)   History of gastric bypass  * Acute on chronic heart failure with preserved ejection fraction (HFpEF) (HCC) Tte here ef 60-65, grade 2 dd.  RHC/LHC in November 2023 did not demonstrate any obstructive CAD.  Symptoms have been gradually worsening over the last month with significant hypervolemia on examination. Out 3.6 liters last 24 - Telemetry monitoring - Lasix 40 mg IV twice daily now converted to torsemide, cardiology advises d/c tomorrow if stable - Daily weights - Strict in and out - Daily BMP and magnesium - replete lytes as needed -- PT eval   Rectal bleeding, likely hemorrhoidal Patient reporting approximately 1 week of bright red blood per rectum in the setting of known hemorrhoidal disease. Given bleeding has occurred independently of bowel movements, likely hemorrhoids. Blood in bowl today but unclear how much bleeding there actually is. No bleeding external hemorrhoids on exam. Stool x2 today and no bleeding. Given some abd pain this hospitalization colonic ischemia also possible. Discussed w/ dr Allegra Lai on 9/14, she advises supportive care, f/u if bleeding worsens, hgb drops, etc - continue rectal suppository twice daily - re-start apixaban   CKD stage 3 due to type 2 diabetes mellitus (HCC) Over the last year, GFR has been primarily in the range of CKD stage IIIa.  Currently improved compared to baseline. - Daily

## 2023-03-31 NOTE — Plan of Care (Signed)

## 2023-03-31 NOTE — Progress Notes (Signed)
PHARMACY CONSULT NOTE - ELECTROLYTES  Pharmacy Consult for Electrolyte Monitoring and Replacement   Recent Labs: Height: 5\' 4"  (162.6 cm) Weight: 113.6 kg (250 lb 6.4 oz) IBW/kg (Calculated) : 54.7 Estimated Creatinine Clearance: 48.1 mL/min (A) (by C-G formula based on SCr of 1.21 mg/dL (H)). Potassium (mmol/L)  Date Value  03/31/2023 3.8  06/21/2014 4.0   Magnesium (mg/dL)  Date Value  78/29/5621 2.1  08/05/2012 1.5 (L)   Calcium (mg/dL)  Date Value  30/86/5784 8.7 (L)   Calcium, Total (mg/dL)  Date Value  69/62/9528 8.7   Albumin (g/dL)  Date Value  41/32/4401 3.6  10/30/2012 3.0 (L)   Sodium (mmol/L)  Date Value  03/31/2023 140  03/05/2023 142  06/21/2014 141   Corrected Ca: 8.5 mg/dL  Assessment  Denise Sharp is a 77 y.o. female presenting with shortness of breath and rectal bleeding. PMH significant for AF,T2DM, HTN, IBS, OSA. Pharmacy has been consulted to monitor and replace electrolytes.  Pertinent medications: torsemide 20 mg po daily  Goal of Therapy:  Potassium 4.0 - 5.1 mmol/L Magnesium 2.0 - 2.4 mg/dL All Other Electrolytes WNL  Plan:  KCl 40 mEq PO x 1 Check BMP and Mg with AM labs  Thank you for allowing pharmacy to be a part of this patient's care.  Lowella Bandy, PharmD Clinical Pharmacist 03/31/2023 8:57 AM

## 2023-03-31 NOTE — Consult Note (Signed)
ANTICOAGULATION CONSULT NOTE - Initial Consult  Pharmacy Consult for Apixaban Indication: atrial fibrillation  Allergies  Allergen Reactions   Demeclocycline Hives   Erythromycin Nausea And Vomiting and Other (See Comments)    Severe irritable bowel   Flagyl [Metronidazole] Nausea And Vomiting and Other (See Comments)    Severe irritable bowel   Glucophage [Metformin Hcl] Nausea And Vomiting and Other (See Comments)    "Sick" "I won't take anything that has metformin in it"   Tetracyclines & Related Hives and Rash   Potassium Chloride Other (See Comments)    Cannot swallow potssium pills, able to tolerate liquid potassium   Tetracycline Other (See Comments)   Diovan [Valsartan] Nausea Only        Sulfa Antibiotics Other (See Comments) and Rash    As child   Xanax [Alprazolam] Other (See Comments)    Hyperactivity     Patient Measurements: Height: 5\' 4"  (162.6 cm) Weight: 113.6 kg (250 lb 6.4 oz) IBW/kg (Calculated) : 54.7  Vital Signs: Temp: 99.2 F (37.3 C) (09/15 1155) Temp Source: Oral (09/15 1155) BP: 131/57 (09/15 1155) Pulse Rate: 73 (09/15 1155)  Labs: Recent Labs    03/29/23 0443 03/29/23 1940 03/29/23 2109 03/30/23 0457 03/30/23 1232 03/31/23 0323  HGB 12.3  --   --   --  12.7 12.5  HCT 37.7  --   --   --  39.5 38.3  PLT 290  --   --   --  303 308  CREATININE 1.04*  --   --  0.99  --  1.21*  TROPONINIHS  --  10 10  --   --   --     Estimated Creatinine Clearance: 48.1 mL/min (A) (by C-G formula based on SCr of 1.21 mg/dL (H)).   Medical History: Past Medical History:  Diagnosis Date   (HFpEF) heart failure with preserved ejection fraction (HCC)    a. 12/2019 Echo: EF 65-70%, Gr2 DD. No significant valvular dzs.   Abnormality of gait 03/25/2013   Adrenal mass, left (HCC)    Anginal pain (HCC)    Anxiety    Aortic atherosclerosis (HCC)    Arthritis    Asthma    CAD (coronary artery disease) with h/o Atypical Chest Pain    a. 04/1986 Cath  ([facility]): nl cors, EF 65%; b. 07/2012 Cath [facility]): Diff minor irregs; c. 07/2016 MV [facility]): "Equivocal"; d. 08/2016 Cardiac CT Ca2+ score [facility]): Ca2+ 1548; e. 05/2019 MV: No ischemia. EF 75%; f. 12/2019 PCI: LM nl, LAD 65ost (3.5x12 Resolute DES), 63m (2.75x12 Resolute DES),LCX/RCA nl; g. 08/2020 Cath: patent LAD stents, RCA 40ost, elev RH pressures, EF >65%.   Cervical spinal stenosis 1994   due to trauma to back ([employer] accident), has intermittent paralysis and parasthesias   Cervicogenic headache 03/23/2014   CKD (chronic kidney disease), stage III (HCC)    Depression    Diverticulosis    Dizziness    a.) chronic   DJD (degenerative joint disease)    a. Chronic R shoulder pain   Dyspnea    Esophageal stenosis 09/2009   a.) transient outlet obstruction by food, cleared by EGD   Family history of adverse reaction to anesthesia    a.) daughter with (+) PONV   Gastric bypass status for obesity    GERD (gastroesophageal reflux disease)    Headache(784.0)    HLD (hyperlipidemia)    Hypertension    IBS (irritable bowel syndrome)    IDA (iron deficiency anemia)  a.) post 2 unit txfsn 2009, normal endo/colonoscopy by [facility]   Left bundle branch block (LBBB)    a.) Intermittently present - likely rate related.   NSVT (nonsustained ventricular tachycardia) (HCC) 11/15/2020   a.) Holter study 11/15/2020; 11 episodes of NSVT lasting up to 8 beats with a maximum rate of 210 bpm; 14 atrial runs lasting up to 18 beats with a rate of up to 250 bpm.  Some atrial runs felt to be NSVT.   NSVT (nonsustained ventricular tachycardia) (HCC)    a. 10/2020 [facility]: 11 runs of NSVT up to 8 beats.   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 10/2020 [facility]: 14 atrial runs up to 18 beats, max HR 250.   Restless leg syndrome    Rotator cuff arthropathy, right 08/13/2017    Syncope and collapse 03/12/2014   Type II diabetes mellitus (HCC)     Assessment: f CAD status post PCI to the ostial and mid LAD in the setting of unstable angina in 12/2019, HFpEF, persistent A-fib diagnosed in 09/2022 status post successful DCCV in 10/2022 on apixaban, DM2, CKD stage III, HTN, HLD, iron deficiency anemia following gastric bypass x 2, IBS, esophageal stenosis, chronic dizziness with gait instability, cervicogenic headache with cervical spine stenosis, obesity, RLS, adrenal mass, recent diagnosis of pancreatic insufficiency, and OSA on CPAP . Patietn consulted to resume Eliquis for Afib.   Plan:  Eliquis 5mg  po BID  Cherly Erno Rodriguez-Guzman PharmD, BCPS 03/31/2023 1:55 PM

## 2023-03-31 NOTE — TOC Initial Note (Addendum)
Transition of Care [facility]) - Initial/Assessment Note    Patient Details  Name: Denise Sharp MRN: [id number] Date of Birth: 1946-02-23  Transition of [facility]) CM/SW Contact:    Darolyn Rua, LCSW Phone Number: 03/31/2023, 2:49 PM  Clinical Narrative:                  CSW met with patient at bedside to review [facility] recs, she is in agreement with [facility] PT and social worker with no preference of agency She does report needing a new walker as hers is 77 years old, ordered via [employer]. Patient has PCP Duncan Dull and states her husband will transport at [employer], [facility] [address]   [facility] referral given to [place] with [facility], no additional dc needs at this time.   Expected Discharge Plan: Home w Home Health Services Barriers to Discharge: Continued Medical Work up   Patient Goals and CMS Choice Patient states their goals for this hospitalization and ongoing recovery are:: to go home CMS Medicare.gov Compare Post Acute Care list provided to:: Patient Choice offered to / list presented to : Patient      Expected Discharge Plan and Services       Living arrangements for the past 2 months: Single Family Home                 DME Arranged: Dan Humphreys DME Agency: [employer] Date DME Agency Contacted: 03/31/23     HH Arranged: PT, Social Work [employer] Agency: [facility] Date HH Agency Contacted: 03/31/23   Representative spoke with at [facility] Agency: cheryl  Prior Living Arrangements/Services Living arrangements for the past 2 months: Single Family Home Lives with:: Self   Do you feel safe going back to the place where you live?: Yes               Activities of Daily Living Home Assistive Devices/Equipment: Cane (specify quad or straight), Walker (specify type) ADL Screening (condition at time of admission) Patient's cognitive ability adequate to safely complete daily activities?: Yes Is the patient deaf or have difficulty hearing?: No Does the patient  have difficulty seeing, even when wearing glasses/contacts?: No Does the patient have difficulty concentrating, remembering, or making decisions?: No Patient able to express need for assistance with ADLs?: Yes Does the patient have difficulty dressing or bathing?: Yes Independently performs ADLs?: No Communication: Independent Dressing (OT): Needs assistance Is this a change from baseline?: Change from baseline, expected to last <3days Grooming: Needs assistance Is this a change from baseline?: Change from baseline, expected to last <3 days Feeding: Independent Bathing: Needs assistance Is this a change from baseline?: Change from baseline, expected to last <3 days Toileting: Needs assistance Is this a change from baseline?: Change from baseline, expected to last <3 days In/Out Bed: Needs assistance Is this a change from baseline?: Change from baseline, expected to last <3 days Walks in Home: Independent with device (comment) Does the patient have difficulty walking or climbing stairs?: Yes Weakness of Legs: Both Weakness of Arms/Hands: None  Permission Sought/Granted                  Emotional Assessment Appearance:: Appears stated age Attitude/Demeanor/Rapport: Gracious Affect (typically observed): Calm Orientation: : Oriented to Self, Oriented to Place, Oriented to  Time, Oriented to Situation Alcohol / Substance Use: Not Applicable Psych Involvement: No (comment)  Admission diagnosis:  Bright red blood per rectum [K62.5] Acute on chronic congestive heart failure, unspecified  heart failure type (HCC) [I50.9] Acute on chronic heart failure with preserved ejection fraction (HFpEF) (HCC) [I50.33] Patient Active Problem List   Diagnosis Date Noted   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   Persistent atrial fibrillation (HCC) 09/27/2022   Hypoglycemia due to insulin 09/24/2022   COVID-19 virus infection 09/01/2022   Abdominal bloating 07/25/2022   Cerebral  vascular disease 06/11/2022   Respiratory illness 04/11/2022   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   Orthostatic dizziness 04/03/2022   Gait abnormality 04/03/2022   Hx of amaurosis fugax 02/06/2022   Protein-calorie malnutrition (HCC) 11/21/2021   Absolute anemia 10/19/2021   Chronic neck pain 09/12/2021   PAC (premature atrial contraction) 08/19/2021       08/03/2021   Stricture and stenosis of esophagus    Pain in joint of right knee 05/17/2021   Sprain of right ankle 05/17/2021   Sprain of right knee 05/17/2021   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/09/2021   PSVT (paroxysmal supraventricular tachycardia) 12/15/2020   CKD stage 3 due to type 2 diabetes mellitus (HCC) 12/09/2020   Other pulmonary embolism with acute cor pulmonale, unspecified chronicity (HCC) 11/17/2020   Mild episode of recurrent major depressive disorder (HCC) 11/17/2020   Hyperlipidemia associated with type 2 diabetes mellitus (HCC) 08/21/2020   Iron deficiency 06/28/2020   Elevated alkaline phosphatase level 06/10/2020   Acute on chronic heart failure with preserved ejection fraction (HFpEF) (HCC) 01/14/2020   CAD S/P percutaneous coronary angioplasty 01/13/2020   S/P cardiac catheterization 01/13/2020   Accelerating angina (HCC) 12/31/2019   Dysphagia 11/30/2019   Bladder pain 08/23/2019   Gastritis 08/07/2019   Lab test negative for COVID-19 virus 08/07/2019   S/P panniculectomy 06/16/2019   Major depressive disorder with current active episode 05/30/2019   Functional diarrhea 10/22/2018   Bilateral cataracts 06/26/2018   History of gastric bypass 02/05/2018   GAD (generalized anxiety disorder) 02/01/2018   Rectocele 01/24/2018   Vaginal prolapse 01/04/2018   Hepatic steatosis 01/04/2018   Microalbuminuria due to type 2 diabetes mellitus (HCC) 11/21/2017   Hospital discharge follow-up 09/22/2017   Fecal incontinence 09/22/2017   B12 deficiency 09/22/2017   Rotator cuff arthropathy,  right 08/13/2017   Primary localized osteoarthrosis of shoulder 08/13/2017   Encounter for preoperative examination for general surgical procedure 08/03/2017   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) 08/03/2017   Candidiasis, intertrigo 08/03/2017   Venous stasis dermatitis of right lower extremity 11/21/2016   Chronic anxiety 10/11/2015   Hypersomnia, persistent 06/18/2015   Mechanical breakdown of implanted electronic neurostimulator of peripheral nerve (HCC) 10/16/2014   Obesity hypoventilation syndrome (HCC) 08/13/2014   Frequent falls 06/23/2014   Chronic cough 05/05/2014   Adenoma of left adrenal gland 03/24/2014   Chronic intractable headache 03/23/2014   Syncope and collapse 03/12/2014   Vitamin D deficiency 01/06/2014   Rectal bleeding 12/03/2013   Weight gain following gastric bypass surgery 12/03/2013   Morbid obesity (HCC) 08/25/2013   Weakness 08/04/2013   Diaphragmatic hernia 04/06/2013   Diabetic polyneuropathy (HCC) 03/25/2013   Abnormality of gait 03/25/2013   Multiple pulmonary nodules 03/20/2013   DOE (dyspnea on exertion) 03/20/2013   Iron deficiency anemia 08/06/2012   Functional disorder of bladder 07/15/2012   Urge incontinence 07/15/2012   Detrusor overactivity 07/15/2012   OSA on CPAP 03/02/2012   Insomnia secondary to anxiety 02/29/2012   Sciatica 09/05/2011   Cervical stenosis of spinal canal 06/14/2011   Restless legs syndrome 03/13/2011   Essential hypertension 03/12/2011  Type II diabetes mellitus with neurological manifestations (HCC) 03/12/2011   Hearing loss, right 03/12/2011   PCP:  Sherlene Shams, MD Pharmacy:   [phone] [place] [address], [place]  - [address] [place] [address] Phone: [phone] Fax: [phone]  [facility] [address] [place] [address] Phone: [phone] Fax: [phone]     Social Determinants of Health  (SDOH) Social History: SDOH Screenings   Food Insecurity: No Food Insecurity (03/28/2023)  Housing: Low Risk  (03/28/2023)  Transportation Needs: No Transportation Needs (03/28/2023)  Utilities: Not At Risk (03/28/2023)  Depression (PHQ2-9): Medium Risk (12/25/2022)  Financial Resource Strain: Low Risk  (09/15/2021)  Physical Activity: Insufficiently Active (11/15/2020)  Stress: Stress Concern Present (08/03/2020)  Tobacco Use: Low Risk  (03/28/2023)   SDOH Interventions:     Readmission Risk Interventions     No data to display

## 2023-03-31 NOTE — Progress Notes (Signed)
Progress Note  Patient Name: Denise Sharp Date of Encounter: 03/31/2023  Primary Cardiologist: End  Subjective   Dyspnea improved. No chest pain. Documented UOP 3.6 L for the past 24 hours, net - 9.1 L for the admission. Weight trend 117.1 to 115.6 kg over the past 48 hours. BUN/SCR starting to trend up. Hgb remains stable with frank BRBPR this admission.   Inpatient Medications    Scheduled Meds:  atorvastatin  80 mg Oral Daily   celecoxib  100 mg Oral Daily   dapagliflozin propanediol  10 mg Oral Daily   DULoxetine  30 mg Oral Daily   ezetimibe  10 mg Oral Daily   furosemide  40 mg Intravenous BID   hydrocortisone  25 mg Rectal BID   insulin aspart  0-15 Units Subcutaneous TID WC   insulin glargine-yfgn  6 Units Subcutaneous QHS   lipase/protease/amylase  72,000 Units Oral TID AC   metoprolol succinate  12.5 mg Oral Daily   mometasone-formoterol  2 puff Inhalation BID   pantoprazole  40 mg Oral Daily   pregabalin  50 mg Oral BID   sodium chloride flush  3 mL Intravenous Q12H   sucralfate  1 g Oral TID AC   Continuous Infusions:  sodium chloride     PRN Meds: sodium chloride, acetaminophen, lipase/protease/amylase **AND** lipase/protease/amylase, loperamide, ondansetron (ZOFRAN) IV, polyvinyl alcohol, sodium chloride flush   Vital Signs    Vitals:   03/30/23 1615 03/30/23 2108 03/30/23 2357 03/31/23 0411  BP: (!) 115/57 (!) 125/56 (!) 111/56 (!) 130/59  Pulse: 78 77 76 68  Resp: 18 16 20 20   Temp: 98.3 F (36.8 C) 98.8 F (37.1 C) 97.9 F (36.6 C) (!) 97.4 F (36.3 C)  TempSrc:  Oral Oral Oral  SpO2: 94% 95% 92% 94%  Weight:      Height:        Intake/Output Summary (Last 24 hours) at 03/31/2023 0735 Last data filed at 03/31/2023 0425 Gross per 24 hour  Intake 670 ml  Output 3800 ml  Net -3130 ml   Filed Weights   03/28/23 2120 03/29/23 0701 03/30/23 0500  Weight: 122.3 kg 117.1 kg 115.6 kg    Telemetry    SR with PACs, PVCS, ventricular  triplet, and a short atrial run, artifact - Personally Reviewed  ECG    No new tracings - Personally Reviewed  Physical Exam   GEN: No acute distress.   Neck: No JVD. Cardiac: RRR, no murmurs, rubs, or gallops.  Respiratory: Mildly diminished breath sounds along the bases bilaterally.  GI: Soft, nontender, non-distended.   MS: Mild bilateral lower extremity edema; No deformity. Neuro:  Alert and oriented x 3; Nonfocal.  Psych: Normal affect.  Labs    Chemistry Recent Labs  Lab 03/26/23 1430 03/28/23 1213 03/29/23 0443 03/30/23 0457 03/31/23 0323  NA 140 142 141 138 140  K 3.2* 3.0* 3.4* 3.5 3.8  CL 109 106 103 99 98  CO2 22 22 26 28 29   GLUCOSE 144* 102* 114* 150* 213*  BUN 10 8 9 13 20   CREATININE 1.31* 1.03* 1.04* 0.99 1.21*  CALCIUM 7.9* 8.5* 8.1* 8.2* 8.7*  PROT 6.5 7.3  --   --   --   ALBUMIN 3.2* 3.6  --   --   --   AST 18 19  --   --   --   ALT 16 16  --   --   --   ALKPHOS 109 125  --   --   --  BILITOT 0.5 1.0  --   --   --   GFRNONAA 42* 56* 55* 59* 46*  ANIONGAP 9 14 12 11 13      Hematology Recent Labs  Lab 03/29/23 0443 03/30/23 1232 03/31/23 0323  WBC 11.1* 13.6* 11.3*  RBC 4.11 4.30 4.25  HGB 12.3 12.7 12.5  HCT 37.7 39.5 38.3  MCV 91.7 91.9 90.1  MCH 29.9 29.5 29.4  MCHC 32.6 32.2 32.6  RDW 15.0 15.5 15.5  PLT 290 303 308    Cardiac EnzymesNo results for input(s): "TROPONINI" in the last 168 hours. No results for input(s): "TROPIPOC" in the last 168 hours.   BNP Recent Labs  Lab 03/26/23 1428 03/28/23 1213  BNP 230.6* 170.0*     DDimer No results for input(s): "DDIMER" in the last 168 hours.   Radiology    CT ABDOMEN PELVIS W CONTRAST  Result Date: 03/29/2023 IMPRESSION: No CT findings to account for the patient's right abdominal pain. 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 Consider follow-up CT or MR abdomen with/without contrast in 1 year, as clinically warranted. Small  bilateral pleural effusions. Electronically Signed   By: Charline Bills M.D.   On: 03/29/2023 22:55   DG Chest 1 View  Result Date: 03/28/2023 IMPRESSION: Cardiomegaly with vascular congestion and probable interstitial edema. Possible small pleural effusions Electronically Signed   By: Jasmine Pang M.D.   On: 03/28/2023 15:48    Cardiac Studies   2D echo 03/29/2023: 1. Left ventricular ejection fraction, by estimation, is 60 to 65%. The  left ventricle has normal function. The left ventricle has no regional  wall motion abnormalities. There is mild left ventricular hypertrophy.  Left ventricular diastolic parameters  are consistent with Grade II diastolic dysfunction (pseudonormalization).   2. Right ventricular systolic function is normal. The right ventricular  size is normal.   3. Left atrial size was mild to moderately dilated.   4. Right atrial size was mildly dilated.   5. The mitral valve is degenerative. No evidence of mitral valve  regurgitation.   6. The aortic valve was not well visualized. Aortic valve regurgitation  is mild.  __________   [facility] 06/04/2022: Conclusions: Mild-moderate, non-obstructive coronary artery disease, similar to prior catheterization in 08/2020. Widely patent ostial LAD stent. Patent mid LAD stent with minimal in stent restenosis (~10% narrowing). Upper normal left and right heart filling pressures. Borderline elevated pulmonary artery pressure. Normal to mildly reduced cardiac output/index.   Recommendations: No obvious findings to explain the patient's progressive symptoms and NSVT on recent event monitor. Restart metoprolol succinate 25 mg daily. Aggressive secondary prevention of coronary artery disease and medical therapy for chronic HFpEF. __________   Zio patch 04/2022:   The patient was enrolled for 30 days.   The predominant rhythm was sinus with an average rate of 76 bpm (range 56-122 bpm).   Six episodes of nonsustained  ventricular tachycardia were observed, lasting up to 11 beats.   No sustained arrhythmia or prolonged pause occurred.  There was no evidence of atrial fibrillation/flutter.   Patient triggered events correspond to sinus rhythm, PACs, and PVCs.   Predominantly sinus rhythm with several episodes of nonsustained ventricular tachycardia.  No atrial fibrillation/flutter identified. __________   [facility] 01/18/2022: Pharmacological myocardial perfusion imaging study with no significant  ischemia Normal wall motion, EF estimated at 95% No EKG changes concerning for ischemia at peak stress or in recovery. CT attenuation correction images with coronary  calcification in the LAD Low risk scan __________   [facility] 01/26/2021: Pharmacological myocardial perfusion imaging study with no significant  ischemia Normal wall motion, EF estimated at 96% No EKG changes concerning for ischemia at peak stress or in recovery. CT attenuation correction images with no significant aortic atherosclerosis, stents noted in the LAD Low risk scan __________   Zio patch 10/2020: The patient was monitored for 4 days, 22 hours. The predominant rhythm was sinus with an average rate of 77 bpm (range 53-121 bpm and sinus). There were rare PACs and PVCs. 11 episodes of nonsustained ventricular tachycardia were observed, lasting up to 8 beats with a maximum rate of 210 bpm. 14 atrial runs lasting up to 18 beats with a maximum rate of 250 bpm occurred. Some episodes labeled as supraventricular tachycardia with aberrancy could represent nonsustained ventricular tachycardia. Patient triggered events correspond to sinus rhythm and sinus rhythm with PVCs and artifact.   Predominantly sinus rhythm with rare PACs and PVCs.  Several episodes of nonsustained ventricular tachycardia and supraventricular tachycardia were observed. __________   [facility] 09/06/2020: Conclusions: Non-obstructive coronary artery disease with 40%  ostial RCA stenosis. Widely patient ostial/proximal and mid LAD stents, with the distal LAD tapering to a small vessel. Mildly elevated left heart and pulmonary artery pressures. Severely elevated right heart filling pressures. Low Fick cardiac output/index consistent with HFpEF.   Recommendations: Continue gentle diuresis. Encourage weight loss and exercise. Consider outpatient referral to advanced heart failure clinic for further evaluation/management of chronic HFpEF. Continue secondary prevention of coronary artery disease. __________   2D echo 12/2019: 1. Left ventricular ejection fraction, by estimation, is 65 to 70%. The  left ventricle has normal function. The left ventricle has no regional  wall motion abnormalities. Left ventricular diastolic parameters are  consistent with Grade II diastolic  dysfunction (pseudonormalization).   2. Right ventricular systolic function is normal. The right ventricular  size is normal.   3. The mitral valve is normal in structure. No evidence of mitral valve  regurgitation. No evidence of mitral stenosis.   4. The aortic valve is normal in structure. Aortic valve regurgitation is  not visualized. No aortic stenosis is present. __________   [facility] 12/2019: Coronary angiography:  Coronary dominance: Right  Left mainstem:   Large vessel that bifurcates into the LAD and left circumflex, no significant disease noted  Left anterior descending (LAD):   Large vessel that extends to the apical region, diagonal branch 2 of moderate size, 65% ostial/proximal LAD disease/regions of haziness, 80% mid LAD disease  Left circumflex (LCx):  Large vessel with OM branch 2, no significant disease noted  Right coronary artery (RCA):  Right dominant vessel with PL and PDA, no significant disease noted  Left ventriculography: Left ventricular systolic function is normal, LVEF is estimated at 55-65%, there is no significant mitral regurgitation , no significant  aortic valve stenosis  Final Conclusions:   Moderate ostial/proximal LAD disease Severe mid LAD disease Normal ejection fraction  Recommendations:  Difficult decision whether to proceed with aggressive intervention Recent negative stress test, symptoms with some typical and atypical features She is relatively immobile, she does report some dysphagia symptoms, unclear if she is appreciating more GI symptoms Recommend starting isosorbide 30 mg daily Images discussed with Dr. Okey Dupre -We will need to consider PCI of the mid LAD, consider FFR of ostial LAD if she continues to have anginal symptoms.  Would recommend this be done at [facility], consider radial access given right groin access very  difficult __________   PCI 12/2019: Ost LAD to Prox LAD lesion is 80% stenosed. Post intervention, there is a 0% residual stenosis. Post intervention, there is a 0% residual stenosis. A drug-eluting stent was successfully placed using a STENT RESOLUTE ONYX 3.5X12. Mid LAD lesion is 80% stenosed. A drug-eluting stent was successfully placed using a STENT RESOLUTE ONYX [id number].   Successful PCI and drug-eluting stent placement to mid as well as ostial LAD.   Recommendations: Dual antiplatelet therapy for at least 6 months but should consider long-term dual antiplatelet therapy given that the ostial LAD stent extends a millimeter into the left main coronary artery Aggressive treatment of risk factors.  Patient Profile     77 y.o. female with history of CAD status post PCI to the ostial and mid LAD in the setting of unstable angina in 12/2019, HFpEF, persistent A-fib diagnosed in 09/2022 status post successful DCCV in 10/2022 on apixaban, DM2, CKD stage III, HTN, HLD, iron deficiency anemia following gastric bypass x 2, IBS, esophageal stenosis, chronic dizziness with gait instability, cervicogenic headache with cervical spine stenosis, obesity, RLS, adrenal mass, recent diagnosis of pancreatic insufficiency,  and OSA on CPAP who is being seen today for the evaluation of acute on chronic HFpEF at the request of Dr. Huel Cote.   Assessment & Plan    1. Acute on chronic HFpEF: -Likely exacerbated by eating out at restaurants  -Volume status is improving -BUN/SCr up trending -Transition from IV Lasix to torsemide 20 mg (PTA on Lasix 20 mg daily to every other day) -Echo with preserved LVSF as above -PTA Farxiga 10 mg -Has not been maintained on MRA in the setting of underlying renal dysfunction -Consider escalation of GDMT with addition of ARNI throughout admission or in hospital follow-up -Daily weights, strict I's and O's -CHF education    2.  CAD involving the native coronary arteries with stable angina: -She reported intermittent chest pain at visit in the office last week which was likely in the setting of heart failure exacerbation -Ruled out -PTA apixaban in place of aspirin given persistent A-fib -No plans for inpatient ischemic testing at this time   3.  Persistent A-fib: -Maintaining sinus rhythm with PACs -PTA Toprol-XL 12.5 mg -CHA2DS2-VASc at least 7 (CHF, HTN, age x 2, DM, vascular disease, sex category) -Remains on apixaban 5 mg twice daily and does not meet reduced dosing criteria   4.  Rectal bleeding: -Hgb stable -Monitor -No indication to interrupt OAC at this time   5.  CKD stage III: -Monitor with diuresis   6.  Hypokalemia: -Improved   7.  HTN: -Blood pressure well-controlled -Not requiring scheduled antihypertensive therapy PTA   8.  HLD: -LDL 45 in 10/2022 -Remains on atorvastatin 80 mg and ezetimibe 10 mg   9.  OSA: -CPAP  10. Adrenal mass: -Outpatient follow up with PCP     For questions or updates, please contact [facility] Please consult [email or web address] for contact info under Cardiology/STEMI.    Signed, Eula Listen, PA-C [facility] Pager: [phone] 03/31/2023, 7:35 AM

## 2023-04-01 ENCOUNTER — Other Ambulatory Visit: Payer: Self-pay | Admitting: *Deleted

## 2023-04-01 DIAGNOSIS — I5033 Acute on chronic diastolic (congestive) heart failure: Secondary | ICD-10-CM

## 2023-04-01 LAB — CBC
HCT: 38.5 % (ref 36.0–46.0)
Hemoglobin: 12.6 g/dL (ref 12.0–15.0)
MCH: 29.9 pg (ref 26.0–34.0)
MCHC: 32.7 g/dL (ref 30.0–36.0)
MCV: 91.2 fL (ref 80.0–100.0)
Platelets: 289 10*3/uL (ref 150–400)
RBC: 4.22 MIL/uL (ref 3.87–5.11)
RDW: 15.3 % (ref 11.5–15.5)
WBC: 10.9 10*3/uL — ABNORMAL HIGH (ref 4.0–10.5)
nRBC: 0 % (ref 0.0–0.2)

## 2023-04-01 LAB — MAGNESIUM: Magnesium: 1.9 mg/dL (ref 1.7–2.4)

## 2023-04-01 LAB — BASIC METABOLIC PANEL
Anion gap: 12 (ref 5–15)
BUN: 25 mg/dL — ABNORMAL HIGH (ref 8–23)
CO2: 28 mmol/L (ref 22–32)
Calcium: 8.3 mg/dL — ABNORMAL LOW (ref 8.9–10.3)
Chloride: 97 mmol/L — ABNORMAL LOW (ref 98–111)
Creatinine, Ser: 1.31 mg/dL — ABNORMAL HIGH (ref 0.44–1.00)
GFR, Estimated: 42 mL/min — ABNORMAL LOW (ref >60)
Glucose, Bld: 239 mg/dL — ABNORMAL HIGH (ref 70–99)
Potassium: 4.2 mmol/L (ref 3.5–5.1)
Sodium: 137 mmol/L (ref 135–145)

## 2023-04-01 LAB — GLUCOSE, CAPILLARY
Glucose-Capillary: 208 mg/dL — ABNORMAL HIGH (ref 70–99)
Glucose-Capillary: 231 mg/dL — ABNORMAL HIGH (ref 70–99)

## 2023-04-01 MED ORDER — HYDROCORTISONE ACETATE 25 MG RE SUPP: 25.0000 mg | Freq: Two times a day (BID) | RECTAL | 5 refills | Status: DC

## 2023-04-01 MED ORDER — TORSEMIDE 20 MG PO TABS: 20.0000 mg | ORAL_TABLET | Freq: Every day | ORAL | 1 refills | Status: DC

## 2023-04-01 MED ORDER — MAGNESIUM SULFATE 2 GM/50ML IV SOLN
2.0000 g | Freq: Once | INTRAVENOUS | Status: AC
  Administered 2023-04-01: 2 g via INTRAVENOUS
  Filled 2023-04-01: qty 50

## 2023-04-01 NOTE — TOC Transition Note (Signed)
Transition of Care [facility]) - CM/SW Discharge Note   Patient Details  Name: Denise Sharp MRN: [id number] Date of Birth: [birth year]  Transition of Care [facility]) CM/SW Contact:  Darolyn Rua, LCSW Phone Number: 04/01/2023, 2:13 PM   Clinical Narrative:     Patient to discharge home today with [facility] services, Elnita Maxwell with [facility] informed discharge today. RW was already delivered via [employer], no additional discharge needs.   Final next level of care: Home w Home Health Services Barriers to Discharge: No Barriers Identified   Patient Goals and CMS Choice CMS Medicare.gov Compare Post Acute Care list provided to:: Patient Choice offered to / list presented to : Patient  Discharge Placement                         Discharge Plan and Services Additional resources added to the After Visit Summary for                  DME Arranged: Dan Humphreys DME Agency: [facility] Date DME Agency Contacted: 03/31/23     HH Arranged: PT, Social Work [employer] Agency: [facility] Date HH Agency Contacted: 03/31/23   Representative spoke with at [facility] Agency: cheryl  Social Determinants of Health (SDOH) Interventions SDOH Screenings   Food Insecurity: No Food Insecurity (03/28/2023)  Housing: Low Risk  (03/28/2023)  Transportation Needs: No Transportation Needs (03/28/2023)  Utilities: Not At Risk (03/28/2023)  Depression (PHQ2-9): Medium Risk (12/25/2022)  Financial Resource Strain: Low Risk  (09/15/2021)  Physical Activity: Insufficiently Active (11/15/2020)  Stress: Stress Concern Present (08/03/2020)  Tobacco Use: Low Risk  (03/28/2023)     Readmission Risk Interventions     No data to display

## 2023-04-01 NOTE — Care Management Important Message (Signed)
Important Message  Patient Details  Name: Denise Sharp MRN: [id number] Date of Birth: 12/20/1945   Medicare Important Message Given:  Yes     Johnell Comings 04/01/2023, 11:15 AM

## 2023-04-01 NOTE — Progress Notes (Signed)
[facility] HF Stewardship  PCP: Sherlene Shams, MD  PCP-Cardiologist: [name], MD  HPI: Denise Sharp is a 77 y.o. female with CAD s/p PCI to ostial and mid LAD (2021), HFpEF, atrial fibrillation on Eliquis, CKD stage IIIa, type 2 diabetes, iron deficiency anemia on IV iron infusions, gastric bypass x 2, esophageal stenosis s/p dilation, chronic dizziness with gait instability, cervicogenic headache, OSA on CPAP, restless leg syndrome who presented with swelling and rectal bleeding. Bleeding suspected to be hemorrhoids. BNP 230.6 on admission. Started on IV furosemide with good urine output. BNP now down to 170 on 9/12. Repeat echo this admission noted LVEF of 60-65% with G2DD, normal RV, and mild AR.  Pertinent cardiac history: TTE in 2013 showed normal EF with normal diastolic function. Cardiac cath noted in 2014 but unable to see results. TTE 08/2017 showed LVEF 65-70 with G2DD. Stress test in 2020 was normal with hyperdynamic LV. TTE in 2021 showed LVEF normal and G2DD. Cardiac cath in 2021 showed ost LAD to prox LAD 65% stenosis, and mid LAD lesion 80% stenosis treated with stenting. Cath in 2022 showed low Fick CO and elevated RH pressures. Also noted to have patent stents and 40% RCA stenosis. Long term heart monitor was placed in 2022 and metoprolol started due to some PVC and fast rates. Stress test negative in 2022 with estimated LVEF of 96%.  Pertinent Lab Values: Creat  Date Value Ref Range Status  07/13/2022 1.14 (H) 0.60 - 1.00 mg/dL Final   Creatinine, Ser  Date Value Ref Range Status  04/01/2023 1.31 (H) 0.44 - 1.00 mg/dL Final   BUN  Date Value Ref Range Status  04/01/2023 25 (H) 8 - 23 mg/dL Final  16/04/9603 13 8 - 27 mg/dL Final  54/03/8118 12 7 - 18 mg/dL Final   Potassium  Date Value Ref Range Status  04/01/2023 4.2 3.5 - 5.1 mmol/L Final  06/21/2014 4.0 3.5 - 5.1 mmol/L Final   Sodium  Date Value Ref Range Status  04/01/2023 137 135 - 145 mmol/L Final   03/05/2023 142 134 - 144 mmol/L Final  06/21/2014 141 136 - 145 mmol/L Final   B Natriuretic Peptide  Date Value Ref Range Status  03/28/2023 170.0 (H) 0.0 - 100.0 pg/mL Final    Comment:    Performed at [facility], [address], [place], [place] [address]   Magnesium  Date Value Ref Range Status  04/01/2023 1.9 1.7 - 2.4 mg/dL Final    Comment:    Performed at [facility], [address] [place], [place] [address]  08/05/2012 1.5 (L) mg/dL Final    Comment:    3.0-8.6 THERAPEUTIC RANGE: 4-7 mg/dL TOXIC: > 10 mg/dL  -----------------------    Hemoglobin A1C  Date Value Ref Range Status  03/21/2023 7.6 (A) 4.0 - 5.6 % Final  08/14/2018 6.5  Final   Hgb A1c MFr Bld  Date Value Ref Range Status  07/13/2022 7.1 (H) <5.7 % of total Hgb Final    Comment:    For someone without known diabetes, a hemoglobin A1c value of 6.5% or greater indicates that they may have  diabetes and this should be confirmed with a follow-up  test. . For someone with known diabetes, a value <7% indicates  that their diabetes is well controlled and a value  greater than or equal to 7% indicates suboptimal  control. A1c targets should be individualized based on  duration of diabetes, age, comorbid conditions, and  other  considerations. . Currently, no consensus exists regarding use of hemoglobin A1c for diagnosis of diabetes for children. .    TSH  Date Value Ref Range Status  09/27/2022 2.887 0.350 - 4.500 uIU/mL Final    Comment:    Performed by a 3rd Generation assay with a functional sensitivity of <=0.01 uIU/mL. Performed at [facility], [address] [place], [place] [address]   01/01/2019 3.50 0.35 - 4.50 uIU/mL Final   LDH  Date Value Ref Range Status  12/28/2020 142 98 - 192 U/L Final    Comment:    Performed at [facility], [address] [place], [place] [address]    Vital Signs: Temp:  [97.9 F (36.6 C)-99.3 F (37.4 C)]  97.9 F (36.6 C) (09/16 0740) Pulse Rate:  [65-77] 70 (09/16 0740) Resp:  [14-20] 20 (09/16 0740) BP: (107-131)/(46-61) 107/54 (09/16 0740) SpO2:  [91 %-100 %] 95 % (09/16 0740) FiO2 (%):  [21 %] 21 % (09/15 2210)   Intake/Output Summary (Last 24 hours) at 04/01/2023 0825 Last data filed at 04/01/2023 0320 Gross per 24 hour  Intake 740 ml  Output 1405 ml  Net -665 ml    Current Inpatient Medications:  -Furosemide 40 mg IV BID  Prior to admission HF Medications:  -Furosemide 20 mg daily -Metoprolol succinate 12.5 mg daily -Spironolactone 12.5 mg daily -Farxiga 10 mg daily  Assessment: 1. 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 NYHA class II-III symptoms.  -Good urine output. Transitioned to oral diuretics. Wt down 21 lbs from admission. Creatinine and BUN both bumped. Reports urine color is light yellow. Still with dyspnea on exertion, but no longer at rest. Appetite improved, but with some abdominal heaviness. Suspect this is reservoir for fluid. Has likely lost fluid from diarrhea as well. Worry patient is now intravscularly dry. If creatinine continues to increase 9/17, may require holding diuretics.  -BP 100s-130s systolic today. HR 60-70s. -On appropriate HFpEF GDMT of spironolactone and Farxiga at home.    Plan: 1) Medication changes recommended at this time: -Consider adding back spironolactone 12.5 mg daily from PTA meds.  2) Patient assistance: Sherryll Burger: $124.09, Farxiga: $105.04, Jardiance: $110.23   3) Education: - Patient has been educated on current HF medications and potential additions to HF medication regimen - Patient verbalizes understanding that over the next few months, these medication doses may change and more medications may be added to optimize HF regimen - Patient has been educated on basic disease state pathophysiology and goals of therapy  Medication Assistance / Insurance Benefits Check: Does the patient have  prescription insurance?  Yes, Medicare and VA  Type of insurance plan:  Does the patient qualify for medication assistance through manufacturers or grants? Pending   Outpatient Pharmacy: Prior to admission outpatient pharmacy: Mail order     Thank you for involving pharmacy in this patient's care.  Enos Fling, PharmD, BCPS Phone - [phone] Clinical Pharmacist 04/01/2023 8:25 AM

## 2023-04-01 NOTE — Plan of Care (Signed)
Problem: Education: Goal: Understanding of CV disease, CV risk reduction, and recovery process will improve Outcome: Adequate for Discharge Goal: Individualized Educational Video(s) Outcome: Adequate for Discharge   Problem: Activity: Goal: Ability to return to baseline activity level will improve Outcome: Adequate for Discharge   Problem: Cardiovascular: Goal: Ability to achieve and maintain adequate cardiovascular perfusion will improve Outcome: Adequate for Discharge Goal: Vascular access site(s) Level 0-1 will be maintained Outcome: Adequate for Discharge   Problem: Health Behavior/Discharge Planning: Goal: Ability to safely manage health-related needs after discharge will improve Outcome: Adequate for Discharge   Problem: Education: Goal: Ability to describe self-care measures that may prevent or decrease complications (Diabetes Survival Skills Education) will improve Outcome: Adequate for Discharge Goal: Individualized Educational Video(s) Outcome: Adequate for Discharge   Problem: Coping: Goal: Ability to adjust to condition or change in health will improve Outcome: Adequate for Discharge   Problem: Fluid Volume: Goal: Ability to maintain a balanced intake and output will improve Outcome: Adequate for Discharge   Problem: Health Behavior/Discharge Planning: Goal: Ability to identify and utilize available resources and services will improve Outcome: Adequate for Discharge Goal: Ability to manage health-related needs will improve Outcome: Adequate for Discharge   Problem: Metabolic: Goal: Ability to maintain appropriate glucose levels will improve Outcome: Adequate for Discharge   Problem: Nutritional: Goal: Maintenance of adequate nutrition will improve Outcome: Adequate for Discharge Goal: Progress toward achieving an optimal weight will improve Outcome: Adequate for Discharge   Problem: Skin Integrity: Goal: Risk for impaired skin integrity will  decrease Outcome: Adequate for Discharge   Problem: Tissue Perfusion: Goal: Adequacy of tissue perfusion will improve Outcome: Adequate for Discharge   Problem: Education: Goal: Knowledge of General Education information will improve Description: Including pain rating scale, medication(s)/side effects and non-pharmacologic comfort measures Outcome: Adequate for Discharge   Problem: Health Behavior/Discharge Planning: Goal: Ability to manage health-related needs will improve Outcome: Adequate for Discharge   Problem: Clinical Measurements: Goal: Ability to maintain clinical measurements within normal limits will improve Outcome: Adequate for Discharge Goal: Will remain free from infection Outcome: Adequate for Discharge Goal: Diagnostic test results will improve Outcome: Adequate for Discharge Goal: Respiratory complications will improve Outcome: Adequate for Discharge Goal: Cardiovascular complication will be avoided Outcome: Adequate for Discharge   Problem: Activity: Goal: Risk for activity intolerance will decrease Outcome: Adequate for Discharge   Problem: Nutrition: Goal: Adequate nutrition will be maintained Outcome: Adequate for Discharge   Problem: Coping: Goal: Level of anxiety will decrease Outcome: Adequate for Discharge   Problem: Elimination: Goal: Will not experience complications related to bowel motility Outcome: Adequate for Discharge Goal: Will not experience complications related to urinary retention Outcome: Adequate for Discharge   Problem: Pain Managment: Goal: General experience of comfort will improve Outcome: Adequate for Discharge   Problem: Safety: Goal: Ability to remain free from injury will improve Outcome: Adequate for Discharge   Problem: Skin Integrity: Goal: Risk for impaired skin integrity will decrease Outcome: Adequate for Discharge

## 2023-04-01 NOTE — Inpatient Diabetes Management (Signed)
Inpatient Diabetes Program Recommendations  AACE/ADA: New Consensus Statement on Inpatient Glycemic Control (2015)  Target Ranges:  Prepandial:   less than 140 mg/dL      Peak postprandial:   less than 180 mg/dL (1-2 hours)      Critically ill patients:  140 - 180 mg/dL   Lab Results  Component Value Date   GLUCAP 208 (H) 04/01/2023   HGBA1C 7.6 (A) 03/21/2023    Review of Glycemic Control  Latest Reference Range & Units 03/31/23 07:44 03/31/23 11:58 03/31/23 15:57 03/31/23 20:52 04/01/23 07:39  Glucose-Capillary 70 - 99 mg/dL 875 (H) 643 (H) 329 (H) 271 (H) 208 (H)  (H): Data is abnormally high  Diabetes history: DM2 Outpatient Diabetes medications:  Tresibe 12 units every day Novolog 10 units TID Farxiga 10 mg QD Current orders for Inpatient glycemic control:  Semglee 6 units at bedtime Novolog 0-15 units TID  Farxiga 10 mg QD  Inpatient Diabetes Program Recommendations:    Might consider,  Semglee 10 units at bedtime Novolog 0-9 units TID  Novolog 3 units TID with meals if she consumes at least 50%  Will continue to follow while inpatient.  Thank you, Dulce Sellar, MSN, CDCES Diabetes Coordinator Inpatient Diabetes Program [phone] (team pager from 8a-5p)

## 2023-04-01 NOTE — Discharge Summary (Signed)
post hysterectomy. Bilateral ovaries are within normal limits. Other: No abdominopelvic ascites. Postsurgical changes along the anterior abdominal wall. Musculoskeletal: Degenerative changes of the visualized thoracolumbar spine. Sacral stimulator. IMPRESSION: No CT findings to account for the patient's right abdominal pain. 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 Consider follow-up CT or MR abdomen with/without contrast in 1 year, as clinically warranted. Small bilateral pleural effusions. Electronically Signed   By: Charline Bills M.D.   On: 03/29/2023 22:55   ECHOCARDIOGRAM COMPLETE  Result Date: 03/29/2023    ECHOCARDIOGRAM REPORT   Patient Name:   FAVIANA SANDVICK Date of Exam: 03/29/2023 Medical Rec #:  [id number]         Height:       64.0 in Accession #:    [id number]        Weight:       258.2 lb Date of Birth:  06-Aug-1945         BSA:          2.181 m Patient Age:    77 years          BP:           100/60 mmHg Patient Gender: F                 HR:           79 bpm. Exam Location:  [facility] Procedure: 2D Echo, Color Doppler and Cardiac Doppler Indications:     CHF-acute diastolic  History:         Patient has prior history of Echocardiogram examinations, most                  recent 12/29/2000. Arrythmias:LBBB; Risk Factors:Hypertension.  Sonographer:     Cristela Blue Referring Phys:  [id number] Verdene Lennert Diagnosing Phys: Debbe Odea MD IMPRESSIONS  1. Left ventricular ejection fraction, by estimation, is 60 to 65%. The left ventricle has normal function. The left ventricle has no regional wall motion abnormalities. There is mild left ventricular hypertrophy. Left  ventricular diastolic parameters are consistent with Grade II diastolic dysfunction (pseudonormalization).  2. Right ventricular systolic function is normal. The right ventricular size is normal.  3. Left atrial size was mild to moderately dilated.  4. Right atrial size was mildly dilated.  5. The mitral valve is degenerative. No evidence of mitral valve regurgitation.  6. The aortic valve was not well visualized. Aortic valve regurgitation is mild. FINDINGS  Left Ventricle: Left ventricular ejection fraction, by estimation, is 60 to 65%. The left ventricle has normal function. The left ventricle has no regional wall motion abnormalities. The left ventricular internal cavity size was normal in size. There is  mild left ventricular hypertrophy. Left ventricular diastolic parameters are consistent with Grade II diastolic dysfunction (pseudonormalization). Right Ventricle: The right ventricular size is normal. No increase in right ventricular wall thickness. Right ventricular systolic function is normal. Left Atrium: Left atrial size was mild to moderately dilated. Right Atrium: Right atrial size was mildly dilated. Pericardium: There is no evidence of pericardial effusion. Mitral Valve: The mitral valve is degenerative in appearance. Mild to moderate mitral annular calcification. No evidence of mitral valve regurgitation. MV peak gradient, 4.8 mmHg. The mean mitral valve gradient is 2.0 mmHg. Tricuspid Valve: The tricuspid valve is normal in structure. Tricuspid valve regurgitation is not demonstrated. Aortic Valve: The aortic valve was not well visualized. Aortic valve regurgitation is mild. Aortic valve mean gradient measures 4.0  post hysterectomy. Bilateral ovaries are within normal limits. Other: No abdominopelvic ascites. Postsurgical changes along the anterior abdominal wall. Musculoskeletal: Degenerative changes of the visualized thoracolumbar spine. Sacral stimulator. IMPRESSION: No CT findings to account for the patient's right abdominal pain. 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 Consider follow-up CT or MR abdomen with/without contrast in 1 year, as clinically warranted. Small bilateral pleural effusions. Electronically Signed   By: Charline Bills M.D.   On: 03/29/2023 22:55   ECHOCARDIOGRAM COMPLETE  Result Date: 03/29/2023    ECHOCARDIOGRAM REPORT   Patient Name:   FAVIANA SANDVICK Date of Exam: 03/29/2023 Medical Rec #:  [id number]         Height:       64.0 in Accession #:    [id number]        Weight:       258.2 lb Date of Birth:  06-Aug-1945         BSA:          2.181 m Patient Age:    77 years          BP:           100/60 mmHg Patient Gender: F                 HR:           79 bpm. Exam Location:  [facility] Procedure: 2D Echo, Color Doppler and Cardiac Doppler Indications:     CHF-acute diastolic  History:         Patient has prior history of Echocardiogram examinations, most                  recent 12/29/2000. Arrythmias:LBBB; Risk Factors:Hypertension.  Sonographer:     Cristela Blue Referring Phys:  [id number] Verdene Lennert Diagnosing Phys: Debbe Odea MD IMPRESSIONS  1. Left ventricular ejection fraction, by estimation, is 60 to 65%. The left ventricle has normal function. The left ventricle has no regional wall motion abnormalities. There is mild left ventricular hypertrophy. Left  ventricular diastolic parameters are consistent with Grade II diastolic dysfunction (pseudonormalization).  2. Right ventricular systolic function is normal. The right ventricular size is normal.  3. Left atrial size was mild to moderately dilated.  4. Right atrial size was mildly dilated.  5. The mitral valve is degenerative. No evidence of mitral valve regurgitation.  6. The aortic valve was not well visualized. Aortic valve regurgitation is mild. FINDINGS  Left Ventricle: Left ventricular ejection fraction, by estimation, is 60 to 65%. The left ventricle has normal function. The left ventricle has no regional wall motion abnormalities. The left ventricular internal cavity size was normal in size. There is  mild left ventricular hypertrophy. Left ventricular diastolic parameters are consistent with Grade II diastolic dysfunction (pseudonormalization). Right Ventricle: The right ventricular size is normal. No increase in right ventricular wall thickness. Right ventricular systolic function is normal. Left Atrium: Left atrial size was mild to moderately dilated. Right Atrium: Right atrial size was mildly dilated. Pericardium: There is no evidence of pericardial effusion. Mitral Valve: The mitral valve is degenerative in appearance. Mild to moderate mitral annular calcification. No evidence of mitral valve regurgitation. MV peak gradient, 4.8 mmHg. The mean mitral valve gradient is 2.0 mmHg. Tricuspid Valve: The tricuspid valve is normal in structure. Tricuspid valve regurgitation is not demonstrated. Aortic Valve: The aortic valve was not well visualized. Aortic valve regurgitation is mild. Aortic valve mean gradient measures 4.0  post hysterectomy. Bilateral ovaries are within normal limits. Other: No abdominopelvic ascites. Postsurgical changes along the anterior abdominal wall. Musculoskeletal: Degenerative changes of the visualized thoracolumbar spine. Sacral stimulator. IMPRESSION: No CT findings to account for the patient's right abdominal pain. 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 Consider follow-up CT or MR abdomen with/without contrast in 1 year, as clinically warranted. Small bilateral pleural effusions. Electronically Signed   By: Charline Bills M.D.   On: 03/29/2023 22:55   ECHOCARDIOGRAM COMPLETE  Result Date: 03/29/2023    ECHOCARDIOGRAM REPORT   Patient Name:   FAVIANA SANDVICK Date of Exam: 03/29/2023 Medical Rec #:  [id number]         Height:       64.0 in Accession #:    [id number]        Weight:       258.2 lb Date of Birth:  06-Aug-1945         BSA:          2.181 m Patient Age:    77 years          BP:           100/60 mmHg Patient Gender: F                 HR:           79 bpm. Exam Location:  [facility] Procedure: 2D Echo, Color Doppler and Cardiac Doppler Indications:     CHF-acute diastolic  History:         Patient has prior history of Echocardiogram examinations, most                  recent 12/29/2000. Arrythmias:LBBB; Risk Factors:Hypertension.  Sonographer:     Cristela Blue Referring Phys:  [id number] Verdene Lennert Diagnosing Phys: Debbe Odea MD IMPRESSIONS  1. Left ventricular ejection fraction, by estimation, is 60 to 65%. The left ventricle has normal function. The left ventricle has no regional wall motion abnormalities. There is mild left ventricular hypertrophy. Left  ventricular diastolic parameters are consistent with Grade II diastolic dysfunction (pseudonormalization).  2. Right ventricular systolic function is normal. The right ventricular size is normal.  3. Left atrial size was mild to moderately dilated.  4. Right atrial size was mildly dilated.  5. The mitral valve is degenerative. No evidence of mitral valve regurgitation.  6. The aortic valve was not well visualized. Aortic valve regurgitation is mild. FINDINGS  Left Ventricle: Left ventricular ejection fraction, by estimation, is 60 to 65%. The left ventricle has normal function. The left ventricle has no regional wall motion abnormalities. The left ventricular internal cavity size was normal in size. There is  mild left ventricular hypertrophy. Left ventricular diastolic parameters are consistent with Grade II diastolic dysfunction (pseudonormalization). Right Ventricle: The right ventricular size is normal. No increase in right ventricular wall thickness. Right ventricular systolic function is normal. Left Atrium: Left atrial size was mild to moderately dilated. Right Atrium: Right atrial size was mildly dilated. Pericardium: There is no evidence of pericardial effusion. Mitral Valve: The mitral valve is degenerative in appearance. Mild to moderate mitral annular calcification. No evidence of mitral valve regurgitation. MV peak gradient, 4.8 mmHg. The mean mitral valve gradient is 2.0 mmHg. Tricuspid Valve: The tricuspid valve is normal in structure. Tricuspid valve regurgitation is not demonstrated. Aortic Valve: The aortic valve was not well visualized. Aortic valve regurgitation is mild. Aortic valve mean gradient measures 4.0  post hysterectomy. Bilateral ovaries are within normal limits. Other: No abdominopelvic ascites. Postsurgical changes along the anterior abdominal wall. Musculoskeletal: Degenerative changes of the visualized thoracolumbar spine. Sacral stimulator. IMPRESSION: No CT findings to account for the patient's right abdominal pain. 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 Consider follow-up CT or MR abdomen with/without contrast in 1 year, as clinically warranted. Small bilateral pleural effusions. Electronically Signed   By: Charline Bills M.D.   On: 03/29/2023 22:55   ECHOCARDIOGRAM COMPLETE  Result Date: 03/29/2023    ECHOCARDIOGRAM REPORT   Patient Name:   FAVIANA SANDVICK Date of Exam: 03/29/2023 Medical Rec #:  [id number]         Height:       64.0 in Accession #:    [id number]        Weight:       258.2 lb Date of Birth:  06-Aug-1945         BSA:          2.181 m Patient Age:    77 years          BP:           100/60 mmHg Patient Gender: F                 HR:           79 bpm. Exam Location:  [facility] Procedure: 2D Echo, Color Doppler and Cardiac Doppler Indications:     CHF-acute diastolic  History:         Patient has prior history of Echocardiogram examinations, most                  recent 12/29/2000. Arrythmias:LBBB; Risk Factors:Hypertension.  Sonographer:     Cristela Blue Referring Phys:  [id number] Verdene Lennert Diagnosing Phys: Debbe Odea MD IMPRESSIONS  1. Left ventricular ejection fraction, by estimation, is 60 to 65%. The left ventricle has normal function. The left ventricle has no regional wall motion abnormalities. There is mild left ventricular hypertrophy. Left  ventricular diastolic parameters are consistent with Grade II diastolic dysfunction (pseudonormalization).  2. Right ventricular systolic function is normal. The right ventricular size is normal.  3. Left atrial size was mild to moderately dilated.  4. Right atrial size was mildly dilated.  5. The mitral valve is degenerative. No evidence of mitral valve regurgitation.  6. The aortic valve was not well visualized. Aortic valve regurgitation is mild. FINDINGS  Left Ventricle: Left ventricular ejection fraction, by estimation, is 60 to 65%. The left ventricle has normal function. The left ventricle has no regional wall motion abnormalities. The left ventricular internal cavity size was normal in size. There is  mild left ventricular hypertrophy. Left ventricular diastolic parameters are consistent with Grade II diastolic dysfunction (pseudonormalization). Right Ventricle: The right ventricular size is normal. No increase in right ventricular wall thickness. Right ventricular systolic function is normal. Left Atrium: Left atrial size was mild to moderately dilated. Right Atrium: Right atrial size was mildly dilated. Pericardium: There is no evidence of pericardial effusion. Mitral Valve: The mitral valve is degenerative in appearance. Mild to moderate mitral annular calcification. No evidence of mitral valve regurgitation. MV peak gradient, 4.8 mmHg. The mean mitral valve gradient is 2.0 mmHg. Tricuspid Valve: The tricuspid valve is normal in structure. Tricuspid valve regurgitation is not demonstrated. Aortic Valve: The aortic valve was not well visualized. Aortic valve regurgitation is mild. Aortic valve mean gradient measures 4.0  post hysterectomy. Bilateral ovaries are within normal limits. Other: No abdominopelvic ascites. Postsurgical changes along the anterior abdominal wall. Musculoskeletal: Degenerative changes of the visualized thoracolumbar spine. Sacral stimulator. IMPRESSION: No CT findings to account for the patient's right abdominal pain. 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 Consider follow-up CT or MR abdomen with/without contrast in 1 year, as clinically warranted. Small bilateral pleural effusions. Electronically Signed   By: Charline Bills M.D.   On: 03/29/2023 22:55   ECHOCARDIOGRAM COMPLETE  Result Date: 03/29/2023    ECHOCARDIOGRAM REPORT   Patient Name:   FAVIANA SANDVICK Date of Exam: 03/29/2023 Medical Rec #:  [id number]         Height:       64.0 in Accession #:    [id number]        Weight:       258.2 lb Date of Birth:  06-Aug-1945         BSA:          2.181 m Patient Age:    77 years          BP:           100/60 mmHg Patient Gender: F                 HR:           79 bpm. Exam Location:  [facility] Procedure: 2D Echo, Color Doppler and Cardiac Doppler Indications:     CHF-acute diastolic  History:         Patient has prior history of Echocardiogram examinations, most                  recent 12/29/2000. Arrythmias:LBBB; Risk Factors:Hypertension.  Sonographer:     Cristela Blue Referring Phys:  [id number] Verdene Lennert Diagnosing Phys: Debbe Odea MD IMPRESSIONS  1. Left ventricular ejection fraction, by estimation, is 60 to 65%. The left ventricle has normal function. The left ventricle has no regional wall motion abnormalities. There is mild left ventricular hypertrophy. Left  ventricular diastolic parameters are consistent with Grade II diastolic dysfunction (pseudonormalization).  2. Right ventricular systolic function is normal. The right ventricular size is normal.  3. Left atrial size was mild to moderately dilated.  4. Right atrial size was mildly dilated.  5. The mitral valve is degenerative. No evidence of mitral valve regurgitation.  6. The aortic valve was not well visualized. Aortic valve regurgitation is mild. FINDINGS  Left Ventricle: Left ventricular ejection fraction, by estimation, is 60 to 65%. The left ventricle has normal function. The left ventricle has no regional wall motion abnormalities. The left ventricular internal cavity size was normal in size. There is  mild left ventricular hypertrophy. Left ventricular diastolic parameters are consistent with Grade II diastolic dysfunction (pseudonormalization). Right Ventricle: The right ventricular size is normal. No increase in right ventricular wall thickness. Right ventricular systolic function is normal. Left Atrium: Left atrial size was mild to moderately dilated. Right Atrium: Right atrial size was mildly dilated. Pericardium: There is no evidence of pericardial effusion. Mitral Valve: The mitral valve is degenerative in appearance. Mild to moderate mitral annular calcification. No evidence of mitral valve regurgitation. MV peak gradient, 4.8 mmHg. The mean mitral valve gradient is 2.0 mmHg. Tricuspid Valve: The tricuspid valve is normal in structure. Tricuspid valve regurgitation is not demonstrated. Aortic Valve: The aortic valve was not well visualized. Aortic valve regurgitation is mild. Aortic valve mean gradient measures 4.0  JAVETTA PITKIN ZOX:[id number] DOB: Oct 09, 1945 DOA: 03/28/2023  PCP: Sherlene Shams, MD  Admit date: 03/28/2023 Discharge date: 04/01/2023  Time spent: 35 minutes  Recommendations for Outpatient Follow-up:  Pcp, cardiology, GI f/u Bmp one week  Consider outpatient imaging monitoring of adrenal incidentaloma    Discharge Diagnoses:  Principal Problem:   Acute on chronic heart failure with preserved ejection fraction (HFpEF) (HCC) Active Problems:   Rectal bleeding   CKD stage 3 due to type 2 diabetes mellitus (HCC)   Type II diabetes mellitus with neurological manifestations (HCC)   Essential hypertension   OSA on CPAP   Persistent atrial fibrillation (HCC)   Iron deficiency anemia   Exocrine pancreatic insufficiency   CAD S/P percutaneous coronary angioplasty   Morbid obesity (HCC)   History of gastric bypass   Discharge Condition: stable  Diet recommendation: heart healthy  Filed Weights   03/30/23 0500 03/31/23 0705 04/01/23 0740  Weight: 115.6 kg 113.6 kg 112.3 kg    History of present illness:  From admission h and p MERRILYNN TIEKEN is a 77 y.o. female with medical history significant of CAD s/p PCI to ostial and mid LAD (2021), HFpEF, atrial fibrillation on Eliquis, CKD stage IIIa, type 2 diabetes, iron deficiency anemia on IV iron infusions, gastric bypass x 2, esophageal stenosis s/p dilation, chronic dizziness with gait instability, cervicogenic headache, OSA on CPAP, restless leg syndrome, who presents to the ED due to leg swelling and rectal bleeding   Mrs.Savaria states that over the last 1 month, she has been slowly gaining weight despite eating much less.  Her abdomen feels significantly distended.  She has has been experience also seeing creasing lower extremity edema, orthopnea, and dyspnea on exertion.  She is taking her Lasix 20 mg daily as prescribed without improvement.  She denies any chest pain or palpitations at this time.  She notes that in May,  she weighed approximately 220 pounds and now she is 270.   Mrs. Brush also endorses bright red blood per rectum that is been occurring for the last week.  She states that she finds bright red blood on her depends even when she has not had a bowel movement.  When she was walking to the toilet, she noted that she was having little droplets of blood and then there was blood in the commode as well.  She is uncertain if there is blood mixed into her stool.  She notes a history of hemorrhoids.  She endorses some abdominal cramps intermittently but no abdominal pain.    Hospital Course:  Patient was admitted and treated for hfpef exacerbation. TTE here with ef 60-65, grade 2 dd. Cardiology consulted and patient was treated with IV diuresis with furosemide. Symptomatically she is improved. No o2 requirement. Cardiology advises switch to torsemide which has been made, will need assessment of kidney function, electrolytes, and volume status in one week. Patient with intermittent small volume rectal bleeding, stable normal hemoglobin, thought to be secondary to internal hemorrhoids, started on prednisone suppositories and no bleeding day of discharge. Evaluated by PT and deemed suitable for d/c home with home health and a rolling walker which we have provided. Other chronic conditions stable. Patient and husband agreeable to discharge.   Procedures: none   Consultations: cardiology  Discharge Exam: Vitals:   04/01/23 0740 04/01/23 1148  BP: (!) 107/54 131/77  Pulse: 70 77  Resp: 20 20  Temp: 97.9 F (36.6 C) 98.5 F (36.9 C)  SpO2: 95% 91%  post hysterectomy. Bilateral ovaries are within normal limits. Other: No abdominopelvic ascites. Postsurgical changes along the anterior abdominal wall. Musculoskeletal: Degenerative changes of the visualized thoracolumbar spine. Sacral stimulator. IMPRESSION: No CT findings to account for the patient's right abdominal pain. 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 Consider follow-up CT or MR abdomen with/without contrast in 1 year, as clinically warranted. Small bilateral pleural effusions. Electronically Signed   By: Charline Bills M.D.   On: 03/29/2023 22:55   ECHOCARDIOGRAM COMPLETE  Result Date: 03/29/2023    ECHOCARDIOGRAM REPORT   Patient Name:   FAVIANA SANDVICK Date of Exam: 03/29/2023 Medical Rec #:  [id number]         Height:       64.0 in Accession #:    [id number]        Weight:       258.2 lb Date of Birth:  06-Aug-1945         BSA:          2.181 m Patient Age:    77 years          BP:           100/60 mmHg Patient Gender: F                 HR:           79 bpm. Exam Location:  [facility] Procedure: 2D Echo, Color Doppler and Cardiac Doppler Indications:     CHF-acute diastolic  History:         Patient has prior history of Echocardiogram examinations, most                  recent 12/29/2000. Arrythmias:LBBB; Risk Factors:Hypertension.  Sonographer:     Cristela Blue Referring Phys:  [id number] Verdene Lennert Diagnosing Phys: Debbe Odea MD IMPRESSIONS  1. Left ventricular ejection fraction, by estimation, is 60 to 65%. The left ventricle has normal function. The left ventricle has no regional wall motion abnormalities. There is mild left ventricular hypertrophy. Left  ventricular diastolic parameters are consistent with Grade II diastolic dysfunction (pseudonormalization).  2. Right ventricular systolic function is normal. The right ventricular size is normal.  3. Left atrial size was mild to moderately dilated.  4. Right atrial size was mildly dilated.  5. The mitral valve is degenerative. No evidence of mitral valve regurgitation.  6. The aortic valve was not well visualized. Aortic valve regurgitation is mild. FINDINGS  Left Ventricle: Left ventricular ejection fraction, by estimation, is 60 to 65%. The left ventricle has normal function. The left ventricle has no regional wall motion abnormalities. The left ventricular internal cavity size was normal in size. There is  mild left ventricular hypertrophy. Left ventricular diastolic parameters are consistent with Grade II diastolic dysfunction (pseudonormalization). Right Ventricle: The right ventricular size is normal. No increase in right ventricular wall thickness. Right ventricular systolic function is normal. Left Atrium: Left atrial size was mild to moderately dilated. Right Atrium: Right atrial size was mildly dilated. Pericardium: There is no evidence of pericardial effusion. Mitral Valve: The mitral valve is degenerative in appearance. Mild to moderate mitral annular calcification. No evidence of mitral valve regurgitation. MV peak gradient, 4.8 mmHg. The mean mitral valve gradient is 2.0 mmHg. Tricuspid Valve: The tricuspid valve is normal in structure. Tricuspid valve regurgitation is not demonstrated. Aortic Valve: The aortic valve was not well visualized. Aortic valve regurgitation is mild. Aortic valve mean gradient measures 4.0

## 2023-04-01 NOTE — Progress Notes (Signed)
PHARMACY CONSULT NOTE - ELECTROLYTES  Pharmacy Consult for Electrolyte Monitoring and Replacement   Recent Labs: Height: 5\' 4"  (162.6 cm) Weight: 113.6 kg (250 lb 6.4 oz) IBW/kg (Calculated) : 54.7 Estimated Creatinine Clearance: 44.5 mL/min (A) (by C-G formula based on SCr of 1.31 mg/dL (H)). Potassium (mmol/L)  Date Value  04/01/2023 4.2  06/21/2014 4.0   Magnesium (mg/dL)  Date Value  75/64/3329 1.9  08/05/2012 1.5 (L)   Calcium (mg/dL)  Date Value  51/88/4166 8.3 (L)   Calcium, Total (mg/dL)  Date Value  01/13/1600 8.7   Albumin (g/dL)  Date Value  09/32/3557 3.6  10/30/2012 3.0 (L)   Sodium (mmol/L)  Date Value  04/01/2023 137  03/05/2023 142  06/21/2014 141   Corrected Ca: 8.5 mg/dL  Assessment  Denise Sharp is a 77 y.o. female presenting with shortness of breath and rectal bleeding. PMH significant for AF,T2DM, HTN, IBS, OSA. Pharmacy has been consulted to monitor and replace electrolytes.  Pertinent medications: torsemide 20 mg po daily  Goal of Therapy:  Potassium 4.0 - 5.1 mmol/L Magnesium 2.0 - 2.4 mg/dL All Other Electrolytes WNL  Plan:  K 4.2, Mg 1.9 Give Mg sulfate 2g IV x1 Check BMP and Mg with AM labs  Thank you for allowing pharmacy to be a part of this patient's care.  Celene Squibb, PharmD Clinical Pharmacist 04/01/2023 7:42 AM

## 2023-04-01 NOTE — Consult Note (Signed)
[facility] Customer service manager [facility]) Accountable Care Organization (ACO) [facility] Liaison Note  04/01/2023  [name] 27-Oct-1945 [id number]  Location: [facility] RN Hospital Liaison met patient at bedside at [facility].  Insurance:  Medicare   Denise Sharp is a 77 y.o. female who is a Primary Care Patient of Tullo, Mar Daring, MD [place] at [facility]. The patient was screened for readmission hospitalization with noted medium risk score for unplanned readmission risk with 1 IP/1 ED in 6 months.  The patient was assessed for potential [facility]) Care Management service needs for post hospital transition for care coordination. Review of patient's electronic medical record reveals patient was admitted with Acute on chronic Heart failure. Liaison spoke with the pt at bedside concerning care management with in the presence of her spouse. Educated on community care management services. Pt receptive to a post hospital prevention readmission follow up and CHF management with the nurse care coordinator.   Plan: [facility] Liaison will continue to follow progress and disposition to asess for post hospital community care coordination/management needs.  Referral request for community care coordination: Will make a referral for a nurse care coordinator to follow up for services.    [facility] Care Management/Population Health does not replace or interfere with any arrangements made by the Inpatient Transition of Care team.   For questions contact:   Elliot Cousin, RN, [facility] Liaison [place]   Population Health Office Hours MTWF  8:00 am-6:00 pm [phone] mobile [phone] [Office toll free line] Office Hours are M-F 8:30 - 5 pm [email or web address]

## 2023-04-02 ENCOUNTER — Telehealth: Payer: Self-pay | Admitting: *Deleted

## 2023-04-02 NOTE — Transitions of Care (Post Inpatient/ED Visit) (Signed)
04/02/2023  Name: Denise Sharp MRN: [id number] DOB: [birth year]  Today's TOC FU Call Status: Today's TOC FU Call Status:: Unsuccessful Call (1st Attempt) Unsuccessful Call (1st Attempt) Date: 04/02/23  Attempted to reach the patient regarding the most recent Inpatient/ED visit.  Follow Up Plan: Additional outreach attempts will be made to reach the patient to complete the Transitions of Care (Post Inpatient/ED visit) call.   Gean Maidens BSN RN [facility] Care Management [phone]

## 2023-04-03 ENCOUNTER — Telehealth: Payer: Self-pay | Admitting: *Deleted

## 2023-04-03 NOTE — Transitions of Care (Post Inpatient/ED Visit) (Signed)
04/03/2023  Name: Denise Sharp MRN: [id number] DOB: [birth year]  Today's TOC FU Call Status: Today's TOC FU Call Status:: Successful TOC FU Call Completed TOC FU Call Complete Date: 04/03/23 Patient's Name and Date of Birth confirmed.  Transition Care Management Follow-up Telephone Call Date of Discharge: 04/01/23 Discharge Facility: [facility]) Type of Discharge: Inpatient Admission Primary Inpatient Discharge Diagnosis:: Acute on chronic heart failure with preserved ejection fraction How have you been since you were released from the hospital?: Better Any questions or concerns?: Yes Patient Questions/Concerns:: Patient is still having some rectal bleeding. She is calling GI to see if sh can get an earlier date Patient Questions/Concerns Addressed: Other:  Items Reviewed: Did you receive and understand the discharge instructions provided?: Yes Medications obtained,verified, and reconciled?: Yes (Medications Reviewed) Any new allergies since your discharge?: No Dietary orders reviewed?: No Do you have support at home?: Yes People in Home: spouse Name of Support/Comfort Primary Source: Alinda Money  Medications Reviewed Today: Medications Reviewed Today     Reviewed by Luella Cook, RN (Case Manager) on 04/03/23 at 1404  Med List Status: <None>   Medication Order Taking? Sig Documenting Provider Last Dose Status Informant  albuterol (VENTOLIN HFA) 108 (90 Base) MCG/ACT inhaler [id number] No Inhale 1-2 puffs into the lungs every 6 (six) hours as needed for wheezing or shortness of breath.  Patient not taking: Reported on 03/28/2023   McLean-Scocuzza, Pasty Spillers, MD Not Taking Active Self  apixaban (ELIQUIS) 5 MG TABS tablet [id number] Yes Take 1 tablet (5 mg total) by mouth 2 (two) times daily. End, Cristal Deer, MD Taking Active Self  atorvastatin (LIPITOR) 80 MG tablet [id number] Yes Take 1 tablet (80 mg total) by mouth daily. Sherlene Shams, MD Taking  Active Self  benzonatate (TESSALON) 200 MG capsule [id number] Yes Take 1 capsule (200 mg total) by mouth 2 (two) times daily as needed for cough. Sherlene Shams, MD Taking Active Self  budesonide-formoterol [facility]) 160-4.5 MCG/ACT inhaler [id number] Yes Inhale 2 puffs into the lungs 2 (two) times daily. Coralyn Helling, MD Taking Active Self  butalbital-acetaminophen-caffeine (FIORICET) 50-325-40 MG tablet [id number] Yes TAKE 1 TABLET BY MOUTH EVERY 6 HOURS AS NEEDED FOR HEADACHE. Sherlene Shams, MD Taking Active Self  Cholecalciferol (VITAMIN D3) 250 MCG (10000 UT) capsule [id number] Yes Take 10,000 Units by mouth daily. [provider] Taking Active Self  clotrimazole-betamethasone (LOTRISONE) cream [id number] Yes Apply 1 application topically 2 (two) times daily as needed (irritation). [provider] Taking Active Self  conjugated estrogens (PREMARIN) vaginal cream [id number] Yes Apply one pea-sized amount around the opening of the urethra daily for 2 weeks, then 3 times weekly moving forward. Carman Ching, PA-C Taking Active Self  Continuous Blood Gluc Receiver (FREESTYLE LIBRE 2 READER) DEVI [id number] Yes Use to check sugar at least TID Sherlene Shams, MD Taking Active Self  cyanocobalamin (VITAMIN B12) 1000 MCG/ML injection [id number] Yes Inject 1 mL (1,000 mcg total) into the muscle every 30 (thirty) days. Inject 1 ml (1000 mcg ) IM weekly x 4,  Then monthly thereafter Sherlene Shams, MD Taking Active Self  dapagliflozin propanediol (FARXIGA) 10 MG TABS tablet [id number] Yes Take 1 tablet (10 mg total) by mouth daily. Sherlene Shams, MD Taking Active Self  dicyclomine (BENTYL) 20 MG tablet [id number] Yes Take 1 tablet (20 mg total) by mouth 3 (three) times daily before meals. [name], MD Taking Active Self  DULoxetine (CYMBALTA) 30 MG capsule [id number] Yes Take 1  capsule (30 mg total) by mouth daily. Glean Salvo, NP Taking Active Self  ezetimibe (ZETIA) 10 MG  tablet [id number] Yes Take 1 tablet (10 mg total) by mouth daily. End, Cristal Deer, MD Taking Active Self  fexofenadine (ALLEGRA) 180 MG tablet [id number] Yes Take 180 mg by mouth as needed for allergies or rhinitis. [provider] Taking Active Self  fluticasone (FLONASE) 50 MCG/ACT nasal spray [id number] Yes Place 2 sprays into both nostrils daily.  Patient taking differently: Place 2 sprays into both nostrils 3 (three) times daily as needed for allergies.   Glori Luis, MD Taking Active Self  Glucagon (GVOKE HYPOPEN 2-PACK) 0.5 MG/0.1ML Ivory Broad [id number] Yes Inject 1 auto injector subcutaneously as needed for severe hypoglycemia. May repeat x 1 after 15 minutes if needed. Bethanie Dicker, NP Taking Active Self  glucose blood (ONETOUCH VERIO) test strip [id number] Yes Use to check blood sugar twice daily. Sherlene Shams, MD Taking Active Self  hydrocortisone (ANUSOL-HC) 25 MG suppository [id number] Yes Place 1 suppository (25 mg total) rectally 2 (two) times daily. Wouk, Wilfred Curtis, MD Taking Active   insulin aspart (NOVOLOG) 100 UNIT/ML injection [id number] Yes Inject 10 Units into the skin 3 (three) times daily before meals. [provider] Taking Active Self  insulin degludec (TRESIBA FLEXTOUCH) 100 UNIT/ML FlexTouch Pen [id number] Yes Inject 12 Units into the skin daily. Sherlene Shams, MD Taking Active Self           Med Note Shona Needles   Thu Dec 06, 2022  1:14 PM) Patient inject at night  insulin lispro (HUMALOG) 100 UNIT/ML injection [id number] Yes Inject 0.08-0.14 mLs (8-14 Units total) into the skin 3 (three) times daily with meals. Eulis Foster, FNP Taking Active Self  Insulin Pen Needle (DROPLET PEN NEEDLES) 31G X 5 MM MISC [id number] Yes USE ONE NEEDLE SUBCUTANEOUSLY AS DIRECTED (REMOVE AND DISCARD NEEDLE IN SHARPS CONTAINER IMMEDIATELY AFTER USE) Sherlene Shams, MD Taking Active Self  lipase/protease/amylase (CREON) 36000 UNITS CPEP capsule [id number] Yes Take 2  capsules (72,000 Units total) by mouth 3 (three) times daily before meals AND 1 capsule (36,000 Units total) with snacks. Celso Amy, PA-C Taking Active Self  loperamide (IMODIUM) 2 MG capsule [id number] Yes Take 4-6 mg by mouth 3 (three) times daily before meals. [provider] Taking Active Self  magnesium gluconate (MAGONATE) 500 MG tablet [id number] No Take 500 mg by mouth daily.  Patient not taking: Reported on 03/28/2023   [provider] Not Taking Active Self  metoprolol succinate (TOPROL-XL) 25 MG 24 hr tablet [id number] Yes Take 0.5 tablets (12.5 mg total) by mouth daily. Take with or immediately following a meal. End, Cristal Deer, MD Taking Active Self  nitroGLYCERIN (NITROSTAT) 0.4 MG SL tablet [id number] Yes Place 1 tablet (0.4 mg total) under the tongue every 5 (five) minutes as needed for chest pain. End, Cristal Deer, MD Taking Active Self  nystatin (MYCOSTATIN/NYSTOP) powder [id number] Yes Apply 1 g topically 4 (four) times daily as needed (rash). [provider] Taking Active Self  ondansetron (ZOFRAN-ODT) 4 MG disintegrating tablet [id number] No Take 1 tablet (4 mg total) by mouth every 8 (eight) hours as needed for nausea or vomiting.  Patient not taking: Reported on 03/28/2023   Sherlene Shams, MD Not Taking Active Self  OneTouch Delica Lancets 33G MISC [id number] Yes Used to check blood sugar two times a day. Sherlene Shams, MD Taking Active Self  OVER THE COUNTER MEDICATION [id number] Yes Apply 1 application  topically daily as needed (pain). [place] Gel topical pain reliever [provider] Taking Active Self  pantoprazole (PROTONIX) 40 MG tablet [id number] Yes Take 1 tablet (40 mg total) by mouth daily. Celso Amy, PA-C Taking Active Self  Polyethyl Glycol-Propyl Glycol (SYSTANE OP) [id number] Yes Place 1 drop into both eyes 4 (four) times daily as needed (dry eyes). [provider] Taking Active Self  pregabalin (LYRICA) 50 MG capsule  [id number] Yes Take 1 capsule (50 mg total) by mouth 2 (two) times daily. Sherlene Shams, MD Taking Active Self  sucralfate (CARAFATE) 1 g tablet [id number] Yes Take 1 tablet (1 g total) by mouth 3 (three) times daily before meals. Celso Amy, PA-C Taking Active Self  SYRINGE-NEEDLE, DISP, 3 ML (LUER LOCK SAFETY SYRINGES) 25G X 1" 3 ML MISC [id number]  Use to give b12 injections once monthly Sherlene Shams, MD  Active Self  torsemide (DEMADEX) 20 MG tablet [id number] Yes Take 1 tablet (20 mg total) by mouth daily. Wouk, Wilfred Curtis, MD Taking Active   Med List Note Christain Sacramento, Kathrene Alu, CPhT 07/22/17 1307): CPAP            Home Care and Equipment/Supplies: Were Home Health Services Ordered?: Yes Name of Home Health Agency:: [facility] Has Agency set up a time to come to your home?: No EMR reviewed for Home Health Orders: Orders present/patient has not received call (refer to CM for follow-up) (they will call back with a time they will come on Friday) Any new equipment or medical supplies ordered?: Yes Name of Medical supply agency?: [employer] (rollator walker) Were you able to get the equipment/medical supplies?: Yes Do you have any questions related to the use of the equipment/supplies?: No  Functional Questionnaire: Do you need assistance with bathing/showering or dressing?: No Do you need assistance with meal preparation?: No Do you need assistance with eating?: No Do you have difficulty maintaining continence: No Do you need assistance with getting out of bed/getting out of a chair/moving?: No Do you have difficulty managing or taking your medications?: No  Follow up appointments reviewed: PCP Follow-up appointment confirmed?: No MD Provider Line [phone] Given: No (Patient will schedule self) Specialist Hospital Follow-up appointment confirmed?: Yes Date of Specialist follow-up appointment?: 04/04/23 Follow-Up Specialty Provider:: Clarisa Kindred, [id number] [name] Do  you need transportation to your follow-up appointment?: No Do you understand care options if your condition(s) worsen?: Yes-patient verbalized understanding  SDOH Interventions Today    Flowsheet Row Most Recent Value  SDOH Interventions   Food Insecurity Interventions Intervention Not Indicated  Housing Interventions Intervention Not Indicated  Transportation Interventions Intervention Not Indicated      Interventions Today    Flowsheet Row Most Recent Value  General Interventions   General Interventions Discussed/Reviewed General Interventions Discussed, General Interventions Reviewed, Doctor Visits, Referral to Nurse, [facility], Communication with, Horticulturist, commercial (DME)  Halford Chessman to George Ina Care Coordinator]  Doctor Visits Discussed/Reviewed Doctor Visits Discussed, Doctor Visits Reviewed  Durable Medical Equipment (DME) Dan Humphreys  Communication with Social Work  Pharmacy Interventions   Pharmacy Dicussed/Reviewed Pharmacy Topics Discussed      TOC Interventions Today    Flowsheet Row Most Recent Value  TOC Interventions   TOC Interventions Discussed/Reviewed TOC Interventions Discussed, TOC Interventions Reviewed       Gean Maidens BSN RN [facility] Care Management [phone]

## 2023-04-03 NOTE — Progress Notes (Deleted)
Advanced Heart Failure Clinic Note   Referring Physician: recent admit PCP: Sherlene Shams, MD (last seen 06/24) Cardiologist: [name], MD (last seen 09/24)  HPI:  Ms Gassen is a 77 y/o female with a history of  Admitted 03/28/23 due to leg swelling and rectal bleeding. Over the last 1 month, she has been slowly gaining weight despite eating much less. Her abdomen feels significantly distended. She has also been experiencing icreasing lower extremity edema, orthopnea, and dyspnea on exertion. Also endorses bright red blood per rectum that has been occurring for the last week. TTE here with ef 60-65, grade 2 dd. Cardiology consulted and patient was treated with IV diuresis with furosemide.    Echo 03/25/12: EF 55-65% with mild AR Echo 08/21/17: EF 65-70% with Grade II DD and mildly elevated PA pressure of 33 mmHg Echo 12/30/19: EF 65-70% with Grade II DD Echo 03/29/23: EF 60-65% with mild LVH, Grade II DD  RHC/ LHC 06/04/22: Mild-moderate, non-obstructive coronary artery disease, similar to prior catheterization in 08/2020. Widely patent ostial LAD stent. Patent mid LAD stent with minimal in stent restenosis (~10% narrowing). Upper normal left and right heart filling pressures. Borderline elevated pulmonary artery pressure. Normal to mildly reduced cardiac output/index.  She presents today for her initial HF Clinic visit with a chief complaint of    Review of Systems: [y] = yes, [ ]  = no   General: Weight gain [ ] ; Weight loss [ ] ; Anorexia [ ] ; Fatigue [ ] ; Fever [ ] ; Chills [ ] ; Weakness [ ]   Cardiac: Chest pain/pressure [ ] ; Resting SOB [ ] ; Exertional SOB [ ] ; Orthopnea [ ] ; Pedal Edema [ ] ; Palpitations [ ] ; Syncope [ ] ; Presyncope [ ] ; Paroxysmal nocturnal dyspnea[ ]   Pulmonary: Cough [ ] ; Wheezing[ ] ; Hemoptysis[ ] ; Sputum [ ] ; Snoring [ ]   GI: Vomiting[ ] ; Dysphagia[ ] ; Melena[ ] ; Hematochezia [ ] ; Heartburn[ ] ; Abdominal pain [ ] ; Constipation [ ] ; Diarrhea [ ] ; BRBPR  [ ]   GU: Hematuria[ ] ; Dysuria [ ] ; Nocturia[ ]   Vascular: Pain in legs with walking [ ] ; Pain in feet with lying flat [ ] ; Non-healing sores [ ] ; Stroke [ ] ; TIA [ ] ; Slurred speech [ ] ;  Neuro: Headaches[ ] ; Vertigo[ ] ; Seizures[ ] ; Paresthesias[ ] ;Blurred vision [ ] ; Diplopia [ ] ; Vision changes [ ]   Ortho/Skin: Arthritis [ ] ; Joint pain [ ] ; Muscle pain [ ] ; Joint swelling [ ] ; Back Pain [ ] ; Rash [ ]   Psych: Depression[ ] ; Anxiety[ ]   Heme: Bleeding problems [ ] ; Clotting disorders [ ] ; Anemia [ ]   Endocrine: Diabetes [ ] ; Thyroid dysfunction[ ]    Past Medical History:  Diagnosis Date   (HFpEF) heart failure with preserved ejection fraction (HCC)    a. 12/2019 Echo: EF 65-70%, Gr2 DD. No significant valvular dzs.   Abnormality of gait 03/25/2013   Adrenal mass, left (HCC)    Anginal pain (HCC)    Anxiety    Aortic atherosclerosis (HCC)    Arthritis    Asthma    CAD (coronary artery disease) with h/o Atypical Chest Pain    a. 04/1986 Cath ([facility]): nl cors, EF 65%; b. 07/2012 Cath [facility]): Diff minor irregs; c. 07/2016 MV [facility]): "Equivocal"; d. 08/2016 Cardiac CT Ca2+ score [facility]): Ca2+ 1548; e. 05/2019 MV: No ischemia. EF 75%; f. 12/2019 PCI: LM nl, LAD 65ost (3.5x12 Resolute DES), 7m (2.75x12 Resolute DES),LCX/RCA nl; g. 08/2020 Cath: patent LAD stents, RCA 40ost, elev RH pressures, EF >65%.  Cervical spinal stenosis 1994   due to trauma to back ([employer] accident), has intermittent paralysis and parasthesias   Cervicogenic headache 03/23/2014   CKD (chronic kidney disease), stage III (HCC)    Depression    Diverticulosis    Dizziness    a.) chronic   DJD (degenerative joint disease)    a. Chronic R shoulder pain   Dyspnea    Esophageal stenosis 09/2009   a.) transient outlet obstruction by food, cleared by EGD   Family history of adverse reaction to anesthesia    a.) daughter with (+) PONV   Gastric bypass status for obesity    GERD (gastroesophageal reflux disease)     Headache(784.0)    HLD (hyperlipidemia)    Hypertension    IBS (irritable bowel syndrome)    IDA (iron deficiency anemia)    a.) post 2 unit txfsn 2009, normal endo/colonoscopy by [facility]   Left bundle branch block (LBBB)    a.) Intermittently present - likely rate related.   NSVT (nonsustained ventricular tachycardia) (HCC) 11/15/2020   a.) Holter study 11/15/2020; 11 episodes of NSVT lasting up to 8 beats with a maximum rate of 210 bpm; 14 atrial runs lasting up to 18 beats with a rate of up to 250 bpm.  Some atrial runs felt to be NSVT.   NSVT (nonsustained ventricular tachycardia) (HCC)    a. 10/2020 [facility]: 11 runs of NSVT up to 8 beats.   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 10/2020 [facility]: 14 atrial runs up to 18 beats, max HR 250.   Restless leg syndrome    Rotator cuff arthropathy, right 08/13/2017   Syncope and collapse 03/12/2014   Type II diabetes mellitus (HCC)     Current Outpatient Medications  Medication Sig Dispense Refill   albuterol (VENTOLIN HFA) 108 (90 Base) MCG/ACT inhaler Inhale 1-2 puffs into the lungs every 6 (six) hours as needed for wheezing or shortness of breath. (Patient not taking: Reported on 03/28/2023) 18 g 2   apixaban (ELIQUIS) 5 MG TABS tablet Take 1 tablet (5 mg total) by mouth 2 (two) times daily. 180 tablet 1   atorvastatin (LIPITOR) 80 MG tablet Take 1 tablet (80 mg total) by mouth daily. 90 tablet 1   benzonatate (TESSALON) 200 MG capsule Take 1 capsule (200 mg total) by mouth 2 (two) times daily as needed for cough. 20 capsule 0   budesonide-formoterol (SYMBICORT) 160-4.5 MCG/ACT inhaler Inhale 2 puffs into the lungs 2 (two) times daily. 1 each 6   butalbital-acetaminophen-caffeine (FIORICET) 50-325-40 MG tablet TAKE 1 TABLET BY MOUTH EVERY 6 HOURS AS NEEDED FOR HEADACHE. 60 tablet 2   Cholecalciferol  (VITAMIN D3) 250 MCG (10000 UT) capsule Take 10,000 Units by mouth daily.     clotrimazole-betamethasone (LOTRISONE) cream Apply 1 application topically 2 (two) times daily as needed (irritation).     conjugated estrogens (PREMARIN) vaginal cream Apply one pea-sized amount around the opening of the urethra daily for 2 weeks, then 3 times weekly moving forward. 30 g 4   Continuous Blood Gluc Receiver (FREESTYLE LIBRE 2 READER) DEVI Use to check sugar at least TID 1 each 1   cyanocobalamin (VITAMIN B12) 1000 MCG/ML injection Inject 1 mL (1,000 mcg total) into the muscle every 30 (thirty) days. Inject 1 ml (1000 mcg ) IM weekly x 4,  Then monthly thereafter 3 mL 1   dapagliflozin propanediol (FARXIGA) 10 MG TABS tablet Take 1 tablet (10 mg total) by mouth daily. 90 tablet 3   dicyclomine (BENTYL) 20 MG tablet Take 1 tablet (20 mg total) by mouth 3 (three) times daily before meals. 180 tablet 2   DULoxetine (CYMBALTA) 30 MG capsule Take 1 capsule (30 mg total) by mouth daily. 90 capsule 4   ezetimibe (ZETIA) 10 MG tablet Take 1 tablet (10 mg total) by mouth daily. 90 tablet 3   fexofenadine (ALLEGRA) 180 MG tablet Take 180 mg by mouth as needed for allergies or rhinitis.     fluticasone (FLONASE) 50 MCG/ACT nasal spray Place 2 sprays into both nostrils daily. (Patient taking differently: Place 2 sprays into both nostrils 3 (three) times daily as needed for allergies.) 16 g 6   Glucagon (GVOKE HYPOPEN 2-PACK) 0.5 MG/0.1ML SOAJ Inject 1 auto injector subcutaneously as needed for severe hypoglycemia. May repeat x 1 after 15 minutes if needed. 0.1 mL 2   glucose blood (ONETOUCH VERIO) test strip Use to check blood sugar twice daily. 100 each 1   hydrocortisone (ANUSOL-HC) 25 MG suppository Place 1 suppository (25 mg total) rectally 2 (two) times daily. 24 suppository 5   insulin aspart (NOVOLOG) 100 UNIT/ML injection Inject 10 Units into the skin 3 (three) times daily before meals.     insulin degludec  (TRESIBA FLEXTOUCH) 100 UNIT/ML FlexTouch Pen Inject 12 Units into the skin daily. 15 mL 3   insulin lispro (HUMALOG) 100 UNIT/ML injection Inject 0.08-0.14 mLs (8-14 Units total) into the skin 3 (three) times daily with meals. (Patient not taking: Reported on 03/28/2023) 10 mL 11   Insulin Pen Needle (DROPLET PEN NEEDLES) 31G X 5 MM MISC USE ONE NEEDLE SUBCUTANEOUSLY AS DIRECTED (REMOVE AND DISCARD NEEDLE IN SHARPS CONTAINER IMMEDIATELY AFTER USE) 100 each 1   lipase/protease/amylase (CREON) 36000 UNITS CPEP capsule Take 2 capsules (72,000 Units total) by mouth 3 (three) times daily before meals AND 1 capsule (36,000 Units total) with snacks. 300 capsule 9   loperamide (IMODIUM) 2 MG capsule Take 4-6 mg by mouth 3 (three) times daily before meals.     magnesium gluconate (MAGONATE) 500 MG tablet Take 500 mg by mouth daily. (Patient not taking: Reported on 03/28/2023)     metoprolol succinate (TOPROL-XL) 25 MG 24 hr tablet Take 0.5 tablets (12.5 mg total) by mouth daily. Take with or immediately following a meal. 45 tablet 3   nitroGLYCERIN (NITROSTAT) 0.4 MG SL tablet Place 1 tablet (0.4 mg total) under the tongue every 5 (five) minutes as needed for chest pain. 25 tablet 0   nystatin (MYCOSTATIN/NYSTOP) powder Apply 1 g topically 4 (four) times daily as needed (rash).     ondansetron (ZOFRAN-ODT) 4 MG disintegrating tablet Take 1 tablet (4 mg total) by mouth every 8 (eight) hours as needed for nausea or vomiting. (Patient not taking: Reported on 03/28/2023) 30 tablet 0   OneTouch Delica Lancets 33G MISC Used to check blood sugar two times a day. 100 each 4   OVER THE COUNTER MEDICATION Apply 1 application  topically daily as needed (pain). [place] Gel topical pain reliever     pantoprazole (PROTONIX) 40 MG tablet Take 1 tablet (40 mg total) by mouth daily. 90 tablet 3   Polyethyl Glycol-Propyl Glycol (SYSTANE OP) Place 1 drop into both eyes 4 (four) times daily as needed (dry eyes).     pregabalin  (LYRICA) 50 MG capsule Take 1  capsule (50 mg total) by mouth 2 (two) times daily. 180 capsule 1   sucralfate (CARAFATE) 1 g tablet Take 1 tablet (1 g total) by mouth 3 (three) times daily before meals. 270 tablet 3   SYRINGE-NEEDLE, DISP, 3 ML (LUER LOCK SAFETY SYRINGES) 25G X 1" 3 ML MISC Use to give b12 injections once monthly 12 each 0   torsemide (DEMADEX) 20 MG tablet Take 1 tablet (20 mg total) by mouth daily. 30 tablet 1   No current facility-administered medications for this visit.    Allergies  Allergen Reactions   Demeclocycline Hives   Erythromycin Nausea And Vomiting and Other (See Comments)    Severe irritable bowel   Flagyl [Metronidazole] Nausea And Vomiting and Other (See Comments)    Severe irritable bowel   Glucophage [Metformin Hcl] Nausea And Vomiting and Other (See Comments)    "Sick" "I won't take anything that has metformin in it"   Tetracyclines & Related Hives and Rash   Potassium Chloride Other (See Comments)    Cannot swallow potssium pills, able to tolerate liquid potassium   Tetracycline Other (See Comments)   Diovan [Valsartan] Nausea Only        Sulfa Antibiotics Other (See Comments) and Rash    As child   Xanax [Alprazolam] Other (See Comments)    Hyperactivity       Social History   Socioeconomic History   Marital status: Married    Spouse name: Alinda Money    Number of children: 2   Years of education: College    Highest education level: Not on file  Occupational History    Employer: retired  Tobacco Use   Smoking status: Never    Passive exposure: Never   Smokeless tobacco: Never  Vaping Use   Vaping status: Never Used  Substance and Sexual Activity   Alcohol use: No   Drug use: Not Currently    Comment: prescribed valium   Sexual activity: Not Currently    Birth control/protection: Post-menopausal, Surgical  Other Topics Concern   Not on file  Social History Narrative   Patient lives at home with husband Alinda Money.    Right Handed    Drinks caffeinated tea occasionally   One pet in house, dog   Social Determinants of Health   Financial Resource Strain: Low Risk  (09/15/2021)   Overall Financial Resource Strain (CARDIA)    Difficulty of Paying Living Expenses: Not hard at all  Food Insecurity: No Food Insecurity (03/28/2023)   Hunger Vital Sign    Worried About Running Out of Food in the Last Year: Never true    Ran Out of Food in the Last Year: Never true  Transportation Needs: No Transportation Needs (03/28/2023)   PRAPARE - Administrator, Civil Service (Medical): No    Lack of Transportation (Non-Medical): No  Physical Activity: Insufficiently Active (11/15/2020)   Exercise Vital Sign    Days of Exercise per Week: 2 days    Minutes of Exercise per Session: 40 min  Stress: Stress Concern Present (08/03/2020)   [employer] of Occupational Health - Occupational Stress Questionnaire    Feeling of Stress : Rather much  Social Connections: Not on file  Intimate Partner Violence: Not At Risk (03/28/2023)   Humiliation, Afraid, Rape, and Kick questionnaire    Fear of Current or Ex-Partner: No    Emotionally Abused: No    Physically Abused: No    Sexually Abused: No      Family  History  Problem Relation Age of Onset   Heart disease Father    Hypertension Father    Prostate cancer Father    Stroke Father    Osteoporosis Father    Stroke Mother    Depression Mother    Headache Mother    Heart disease Mother    Thyroid disease Mother    Hypertension Mother    Diabetes Daughter    Heart disease Daughter    Hypertension Daughter    Hypertension Son       PHYSICAL EXAM: General:  Well appearing. No respiratory difficulty HEENT: normal Neck: supple. no JVD. Carotids 2+ bilat; no bruits. No lymphadenopathy or thyromegaly appreciated. Cor: PMI nondisplaced. Regular rate & rhythm. No rubs, gallops or murmurs. Lungs: clear Abdomen: soft, nontender, nondistended. No hepatosplenomegaly. No bruits  or masses. Good bowel sounds. Extremities: no cyanosis, clubbing, rash, edema Neuro: alert & oriented x 3, cranial nerves grossly intact. moves all 4 extremities w/o difficulty. Affect pleasant.  ECG:   ASSESSMENT & PLAN:  1: Chronic heart failure with preserved ejection fraction- - suspect due to - NYHA class - euvolemic - weighing daily - Echo 03/25/12: EF 55-65% with mild AR - Echo 08/21/17: EF 65-70% with Grade II DD and mildly elevated PA pressure of 33 mmHg - Echo 12/30/19: EF 65-70% with Grade II DD - Echo 03/29/23: EF 60-65% with mild LVH, Grade II DD - continue  - BNP 03/28/23 was 170.0  2: HTN- - BP - saw PCP ([place]) 06/24 - BMP 04/01/23 showed sodium 137, potassium 4.2, creatinine 1.31 & GFR 42  3: PAF- - saw cardiology ([facility]) 09/24  4: OSA- - saw pulmonology Craige Cotta) 08/24  5: Rectal bleeding- - saw GI Gerre Pebbles) 08/24  6: Iron deficiency anemia- - saw hematology Donneta Romberg) 09/24 - hemoglobin 04/01/23 was 12.6  7: DM- - saw endocrinology Lurlean Leyden) 09/24 - A1c 03/21/23 was 7.6%  Delma Freeze, FNP 04/03/23

## 2023-04-04 ENCOUNTER — Encounter: Payer: Medicare Other | Admitting: Family

## 2023-04-05 ENCOUNTER — Ambulatory Visit: Payer: Self-pay | Admitting: *Deleted

## 2023-04-05 DIAGNOSIS — D509 Iron deficiency anemia, unspecified: Secondary | ICD-10-CM | POA: Diagnosis not present

## 2023-04-05 DIAGNOSIS — Z794 Long term (current) use of insulin: Secondary | ICD-10-CM | POA: Diagnosis not present

## 2023-04-05 DIAGNOSIS — I5033 Acute on chronic diastolic (congestive) heart failure: Secondary | ICD-10-CM | POA: Diagnosis not present

## 2023-04-05 DIAGNOSIS — Z7984 Long term (current) use of oral hypoglycemic drugs: Secondary | ICD-10-CM | POA: Diagnosis not present

## 2023-04-05 DIAGNOSIS — I2510 Atherosclerotic heart disease of native coronary artery without angina pectoris: Secondary | ICD-10-CM | POA: Diagnosis not present

## 2023-04-05 DIAGNOSIS — I130 Hypertensive heart and chronic kidney disease with heart failure and stage 1 through stage 4 chronic kidney disease, or unspecified chronic kidney disease: Secondary | ICD-10-CM | POA: Diagnosis not present

## 2023-04-05 DIAGNOSIS — E1122 Type 2 diabetes mellitus with diabetic chronic kidney disease: Secondary | ICD-10-CM | POA: Diagnosis not present

## 2023-04-05 DIAGNOSIS — Z9884 Bariatric surgery status: Secondary | ICD-10-CM | POA: Diagnosis not present

## 2023-04-05 DIAGNOSIS — N183 Chronic kidney disease, stage 3 unspecified: Secondary | ICD-10-CM | POA: Diagnosis not present

## 2023-04-05 DIAGNOSIS — K8681 Exocrine pancreatic insufficiency: Secondary | ICD-10-CM | POA: Diagnosis not present

## 2023-04-05 DIAGNOSIS — G4733 Obstructive sleep apnea (adult) (pediatric): Secondary | ICD-10-CM | POA: Diagnosis not present

## 2023-04-05 DIAGNOSIS — Z79899 Other long term (current) drug therapy: Secondary | ICD-10-CM | POA: Diagnosis not present

## 2023-04-05 DIAGNOSIS — I4819 Other persistent atrial fibrillation: Secondary | ICD-10-CM | POA: Diagnosis not present

## 2023-04-05 DIAGNOSIS — Z955 Presence of coronary angioplasty implant and graft: Secondary | ICD-10-CM | POA: Diagnosis not present

## 2023-04-05 DIAGNOSIS — I110 Hypertensive heart disease with heart failure: Secondary | ICD-10-CM | POA: Diagnosis not present

## 2023-04-05 DIAGNOSIS — G2581 Restless legs syndrome: Secondary | ICD-10-CM | POA: Diagnosis not present

## 2023-04-05 DIAGNOSIS — E1142 Type 2 diabetes mellitus with diabetic polyneuropathy: Secondary | ICD-10-CM | POA: Diagnosis not present

## 2023-04-05 DIAGNOSIS — Z6841 Body Mass Index (BMI) 40.0 and over, adult: Secondary | ICD-10-CM | POA: Diagnosis not present

## 2023-04-05 DIAGNOSIS — Z7901 Long term (current) use of anticoagulants: Secondary | ICD-10-CM | POA: Diagnosis not present

## 2023-04-05 NOTE — Patient Outreach (Signed)
Care Coordination   Initial Visit Note   04/05/2023 Name: BROOKELYNN BERGHORST MRN: [id number] DOB: 02-12-1946  ESTORIA DAUER is a 77 y.o. year old female who sees Darrick Huntsman, Mar Daring, MD for primary care. I spoke with  [name] by phone today.  What matters to the patients health and wellness today?  Patient requesting resources for housekeeping    Goals Addressed             This Visit's Progress    Community resource needs       Interventions Today    Flowsheet Row Most Recent Value  Chronic Disease   Chronic disease during today's visit Congestive Heart Failure (CHF)  General Interventions   General Interventions Discussed/Reviewed General Interventions Discussed, [facility]  [needs assessment completed, pt confirms need  for housekeeping due to current medical conditon-resources to be mailed to patient's home-patient and spouse able to complete there own ADL's-HH ordered through [facility]]  Doctor Visits Discussed/Reviewed Doctor Visits Discussed  [heart failure clinic 9/23 Neurologist 9/23]              SDOH assessments and interventions completed:  Yes  SDOH Interventions Today    Flowsheet Row Most Recent Value  SDOH Interventions   Food Insecurity Interventions Intervention Not Indicated  Housing Interventions Intervention Not Indicated  Transportation Interventions Intervention Not Indicated  Utilities Interventions Intervention Not Indicated        Care Coordination Interventions:  Yes, provided   Follow up plan: Follow up call scheduled for 04/19/23    Encounter Outcome:  Patient Visit Completed

## 2023-04-05 NOTE — Patient Instructions (Signed)
Visit Information  Thank you for taking time to visit with me today. Please don't hesitate to contact me if I can be of assistance to you.   Following are the goals we discussed today:  CSW to send resources for housekeeping options by mail Please call your provider with any questions you may have regarding your follow up care  Our next appointment is by telephone on 04/19/23 at 2pm  Please call the care guide team at [phone] if you need to cancel or reschedule your appointment.   If you are experiencing a Mental Health or Behavioral Health Crisis or need someone to talk to, please call [phone]   Patient verbalizes understanding of instructions and care plan provided today and agrees to view in MyChart. Active MyChart status and patient understanding of how to access instructions and care plan via MyChart confirmed with patient.     Telephone follow up appointment with care management team member scheduled for: 04/19/23  Verna Czech, LCSW [place]  [facility], [facility] Licensed Clinical Social Worker Care Coordinator  Direct Dial: [phone]

## 2023-04-08 ENCOUNTER — Ambulatory Visit (HOSPITAL_BASED_OUTPATIENT_CLINIC_OR_DEPARTMENT_OTHER): Payer: Medicare Other | Admitting: Cardiology

## 2023-04-08 ENCOUNTER — Ambulatory Visit (INDEPENDENT_AMBULATORY_CARE_PROVIDER_SITE_OTHER): Payer: Medicare Other | Admitting: Urology

## 2023-04-08 ENCOUNTER — Other Ambulatory Visit
Admission: RE | Admit: 2023-04-08 | Discharge: 2023-04-08 | Disposition: A | Discharge status: Home / Self Care | Payer: Medicare Other | Source: Ambulatory Visit | Attending: Family | Admitting: Family

## 2023-04-08 VITALS — BP 114/76 | HR 81 | Ht 64.5 in | Wt 242.5 lb

## 2023-04-08 VITALS — BP 124/76 | HR 77 | Wt 245.4 lb

## 2023-04-08 DIAGNOSIS — N183 Chronic kidney disease, stage 3 unspecified: Secondary | ICD-10-CM | POA: Diagnosis not present

## 2023-04-08 DIAGNOSIS — D509 Iron deficiency anemia, unspecified: Secondary | ICD-10-CM | POA: Insufficient documentation

## 2023-04-08 DIAGNOSIS — N3946 Mixed incontinence: Secondary | ICD-10-CM

## 2023-04-08 DIAGNOSIS — I4819 Other persistent atrial fibrillation: Secondary | ICD-10-CM | POA: Insufficient documentation

## 2023-04-08 DIAGNOSIS — E1169 Type 2 diabetes mellitus with other specified complication: Secondary | ICD-10-CM

## 2023-04-08 DIAGNOSIS — K625 Hemorrhage of anus and rectum: Secondary | ICD-10-CM | POA: Diagnosis not present

## 2023-04-08 DIAGNOSIS — G4733 Obstructive sleep apnea (adult) (pediatric): Secondary | ICD-10-CM | POA: Diagnosis not present

## 2023-04-08 DIAGNOSIS — I2510 Atherosclerotic heart disease of native coronary artery without angina pectoris: Secondary | ICD-10-CM | POA: Diagnosis not present

## 2023-04-08 DIAGNOSIS — I480 Paroxysmal atrial fibrillation: Secondary | ICD-10-CM | POA: Diagnosis not present

## 2023-04-08 DIAGNOSIS — I5032 Chronic diastolic (congestive) heart failure: Secondary | ICD-10-CM | POA: Insufficient documentation

## 2023-04-08 DIAGNOSIS — E1122 Type 2 diabetes mellitus with diabetic chronic kidney disease: Secondary | ICD-10-CM | POA: Insufficient documentation

## 2023-04-08 DIAGNOSIS — Z79899 Other long term (current) drug therapy: Secondary | ICD-10-CM | POA: Diagnosis not present

## 2023-04-08 DIAGNOSIS — I5033 Acute on chronic diastolic (congestive) heart failure: Secondary | ICD-10-CM | POA: Diagnosis not present

## 2023-04-08 DIAGNOSIS — I130 Hypertensive heart and chronic kidney disease with heart failure and stage 1 through stage 4 chronic kidney disease, or unspecified chronic kidney disease: Secondary | ICD-10-CM | POA: Insufficient documentation

## 2023-04-08 DIAGNOSIS — I10 Essential (primary) hypertension: Secondary | ICD-10-CM | POA: Diagnosis not present

## 2023-04-08 LAB — URINALYSIS, COMPLETE
Bilirubin, UA: NEGATIVE
Ketones, UA: NEGATIVE
Leukocytes,UA: NEGATIVE
Nitrite, UA: NEGATIVE
Protein,UA: NEGATIVE
Specific Gravity, UA: 1.01 (ref 1.005–1.030)
Urobilinogen, Ur: 0.2 mg/dL (ref 0.2–1.0)
pH, UA: 5 (ref 5.0–7.5)

## 2023-04-08 LAB — BRAIN NATRIURETIC PEPTIDE: B Natriuretic Peptide: 23.2 pg/mL (ref 0.0–100.0)

## 2023-04-08 LAB — BASIC METABOLIC PANEL
Anion gap: 15 (ref 5–15)
BUN: 36 mg/dL — ABNORMAL HIGH (ref 8–23)
CO2: 26 mmol/L (ref 22–32)
Calcium: 9.3 mg/dL (ref 8.9–10.3)
Chloride: 98 mmol/L (ref 98–111)
Creatinine, Ser: 1.58 mg/dL — ABNORMAL HIGH (ref 0.44–1.00)
GFR, Estimated: 34 mL/min — ABNORMAL LOW (ref >60)
Glucose, Bld: 162 mg/dL — ABNORMAL HIGH (ref 70–99)
Potassium: 3.7 mmol/L (ref 3.5–5.1)
Sodium: 139 mmol/L (ref 135–145)

## 2023-04-08 LAB — CBC
HCT: 44.6 % (ref 36.0–46.0)
Hemoglobin: 14.2 g/dL (ref 12.0–15.0)
MCH: 29.5 pg (ref 26.0–34.0)
MCHC: 31.8 g/dL (ref 30.0–36.0)
MCV: 92.7 fL (ref 80.0–100.0)
Platelets: 313 10*3/uL (ref 150–400)
RBC: 4.81 MIL/uL (ref 3.87–5.11)
RDW: 14.6 % (ref 11.5–15.5)
WBC: 13 10*3/uL — ABNORMAL HIGH (ref 4.0–10.5)
nRBC: 0 % (ref 0.0–0.2)

## 2023-04-08 LAB — MICROSCOPIC EXAMINATION: Epithelial Cells (non renal): >10 /hpf — AB (ref 0–10)

## 2023-04-08 MED ORDER — GEMTESA 75 MG PO TABS: 1.0000 | ORAL_TABLET | Freq: Every day | ORAL | Status: DC

## 2023-04-08 NOTE — Progress Notes (Signed)
04/08/2023 1:42 PM   Denise Sharp Apr 26, 1946 [id number]  Referring provider: Sherlene Shams, MD [address] [place],  [place] [address]  Chief Complaint  Patient presents with   Urinary Incontinence    HPI: I placed an InterStim device September 18, 2021.  I had removed an old device.  The case took a long time and actually tried both sides to get finally good responses.  The lead was placed in the same position but in my opinion improved versus the initial InterStim lead.   The patient had presented with high-volume urge incontinence wearing 6-8 pads a day with flooding foot on the floor syndrome in the middle of the night.  She wanted an MRI compatible device as well.  She had been dry on the InterStim years back.   Little to no pain. Incisions look great.  She is completely continent and very happy     Today Excellent bladder control.  In the last 3-week she has had some left-sided pain and she wondered if it could be due to her InterStim.  Clinically not infected  Incisions look great.  No tenderness.   Patient is urine looked infected.  She had a negative CT scan in December 2022.  She had a negative cystoscopy by Dr. Apolinar Junes in August 2022.  This is being done for blood in the urine.  She had a positive culture on that day.  She had to be given fosfomycin.  Patient might have a bladder infection.  Reassurance given about InterStim.  Call if culture positive.  See the patient in 1 year.  Called in ciprofloxacin 250 mg twice a day for 7 days  Today I saw the patient 1 year ago.  She has been seen multiple times by our nurse practitioner last time being October 2023.  Had a positive urine culture July 2023 when I saw her and since then had yeast in the urine once.  The InterStim procedure was in March 2023.  I reviewed the operative note and other notes prior.  It was a challenging procedure as noted.  She in the past has had uncontrolled diabetes.  She has been  offered vaginal estrogen cream for recurrent UTIs  Patient was recently admitted in September 2024 for acute on chronic congestive heart failure.  She may have gained as much is 50 pounds in weight and has been switched from Lasix to a stronger diuretic.  She has chronic renal insufficiency diabetes atrial fibrillation sleep apnea obesity and other cardiac issues. She wears 4-6 pads a day that can be very wet with urge incontinence especially when she goes from sitting to standing position.  She has not been changing the settings of the InterStim device.  She also wears pads for complete loss control of bowels with diarrhea and fecal incontinence.  She said her sugar control varies.  She does not think she has had bladder infections in the last 6 months.                PMH: Past Medical History:  Diagnosis Date   (HFpEF) heart failure with preserved ejection fraction (HCC)    a. 12/2019 Echo: EF 65-70%, Gr2 DD. No significant valvular dzs.   Abnormality of gait 03/25/2013   Adrenal mass, left (HCC)    Anginal pain (HCC)    Anxiety    Aortic atherosclerosis (HCC)    Arthritis    Asthma    CAD (coronary artery disease) with h/o Atypical  Chest Pain    a. 04/1986 Cath ([facility]): nl cors, EF 65%; b. 07/2012 Cath [facility]): Diff minor irregs; c. 07/2016 MV [facility]): "Equivocal"; d. 08/2016 Cardiac CT Ca2+ score [facility]): Ca2+ 1548; e. 05/2019 MV: No ischemia. EF 75%; f. 12/2019 PCI: LM nl, LAD 65ost (3.5x12 Resolute DES), 10m (2.75x12 Resolute DES),LCX/RCA nl; g. 08/2020 Cath: patent LAD stents, RCA 40ost, elev RH pressures, EF >65%.   Cervical spinal stenosis 1994   due to trauma to back ([employer] accident), has intermittent paralysis and parasthesias   Cervicogenic headache 03/23/2014   CKD (chronic kidney disease), stage III (HCC)    Depression    Diverticulosis    Dizziness    a.) chronic   DJD (degenerative joint disease)    a. Chronic R shoulder pain   Dyspnea    Esophageal stenosis 09/2009    a.) transient outlet obstruction by food, cleared by EGD   Family history of adverse reaction to anesthesia    a.) daughter with (+) PONV   Gastric bypass status for obesity    GERD (gastroesophageal reflux disease)    Headache(784.0)    HLD (hyperlipidemia)    Hypertension    IBS (irritable bowel syndrome)    IDA (iron deficiency anemia)    a.) post 2 unit txfsn 2009, normal endo/colonoscopy by [facility]   Left bundle branch block (LBBB)    a.) Intermittently present - likely rate related.   NSVT (nonsustained ventricular tachycardia) (HCC) 11/15/2020   a.) Holter study 11/15/2020; 11 episodes of NSVT lasting up to 8 beats with a maximum rate of 210 bpm; 14 atrial runs lasting up to 18 beats with a rate of up to 250 bpm.  Some atrial runs felt to be NSVT.   NSVT (nonsustained ventricular tachycardia) (HCC)    a. 10/2020 [facility]: 11 runs of NSVT up to 8 beats.   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 10/2020 [facility]: 14 atrial runs up to 18 beats, max HR 250.   Restless leg syndrome    Rotator cuff arthropathy, right 08/13/2017   Syncope and collapse 03/12/2014   Type II diabetes mellitus (HCC)     Surgical History: Past Surgical History:  Procedure Laterality Date   APPENDECTOMY     BACK SURGERY     CARDIOVERSION N/A 10/30/2022   Procedure: CARDIOVERSION;  Surgeon: [name], MD;  Location: [facility] ORS;  Service: Cardiovascular;  Laterality: N/A;   CHOLECYSTECTOMY N/A 1976   COLONOSCOPY     CORONARY ANGIOPLASTY     CORONARY STENT INTERVENTION N/A 01/04/2020   Procedure: CORONARY STENT INTERVENTION;  Surgeon: Iran Ouch, MD;  Location: [facility] INVASIVE CV LAB;  Service: Cardiovascular;  Laterality: N/A;  LAD    DIAPHRAGMATIC HERNIA REPAIR  2015   ESOPHAGEAL DILATION     multiple   ESOPHAGOGASTRODUODENOSCOPY (EGD) WITH PROPOFOL N/A  05/11/2021   Procedure: ESOPHAGOGASTRODUODENOSCOPY (EGD) WITH PROPOFOL;  Surgeon: [name], MD;  Location: [facility] ENDOSCOPY;  Service: Endoscopy;  Laterality: N/A;   ESOPHAGOGASTRODUODENOSCOPY (EGD) WITH PROPOFOL N/A 06/13/2021   Procedure: ESOPHAGOGASTRODUODENOSCOPY (EGD) WITH PROPOFOL;  Surgeon: [name], MD;  Location: [facility] ENDOS[facility];  Service: Endoscopy;  Laterality: N/A;   ESOPHAGOGASTRODUODENOSCOPY (EGD) WITH PROPOFOL N/A 02/28/2023   Procedure: ESOPHAGOGASTRODUODENOSCOPY (EGD) WITH PROPOFOL;  Surgeon: [name], MD;  Location: [facility] ENDOSCOPY;  Service: Endoscopy;  Laterality: N/A;   EYE  SURGERY     GASTRIC BYPASS  2000, 2005   Dr. Arlana Pouch IMPLANT PLACEMENT  10/2011   Jeannette How IMPLANT PLACEMENT N/A 09/18/2021   Procedure: Leane Platt IMPLANT FIRST STAGE;  Surgeon: Alfredo Martinez, MD;  Location: [facility] ORS;  Service: Urology;  Laterality: N/A;   INTERSTIM IMPLANT PLACEMENT N/A 09/18/2021   Procedure: Leane Platt IMPLANT SECOND STAGE WITH IMPEDENCE CHECK;  Surgeon: Alfredo Martinez, MD;  Location: [facility];  Service: Urology;  Laterality: N/A;   INTERSTIM IMPLANT REMOVAL N/A 09/18/2021   Procedure: REMOVAL OF INTERSTIM IMPLANT;  Surgeon: Alfredo Martinez, MD;  Location: [facility];  Service: Urology;  Laterality: N/A;   LEFT HEART CATH AND CORONARY ANGIOGRAPHY N/A 04/1986   LEFT HEART CATH AND CORONARY ANGIOGRAPHY Left 07/24/2012   LEFT HEART CATH AND CORS/GRAFTS ANGIOGRAPHY N/A 12/31/2019   Procedure: LEFT HEART CATH AND CORS/GRAFTS ANGIOGRAPHY;  Surgeon: Antonieta Iba, MD;  Location: [facility];  Service: Cardiovascular;  Laterality: N/A;   PANNICULECTOMY N/A 06/16/2019   Procedure: PANNICULECTOMY;  Surgeon: Allena Napoleon, MD;  Location: [facility] OR;  Service: Plastics;  Laterality: N/A;  3 hours, please   REVERSE SHOULDER ARTHROPLASTY Right 08/13/2017   Procedure: REVERSE RIGHT SHOULDER ARTHROPLASTY;  Surgeon: Teryl Lucy, MD;  Location: [facility] OR;   Service: Orthopedics;  Laterality: Right;   RIGHT/LEFT HEART CATH AND CORONARY ANGIOGRAPHY N/A 09/06/2020   Procedure: RIGHT/LEFT HEART CATH AND CORONARY ANGIOGRAPHY;  Surgeon: [name], MD;  Location: [facility];  Service: Cardiovascular;  Laterality: N/A;   RIGHT/LEFT HEART CATH AND CORONARY ANGIOGRAPHY N/A 06/04/2022   Procedure: RIGHT/LEFT HEART CATH AND CORONARY ANGIOGRAPHY;  Surgeon: [name], MD;  Location: [facility];  Service: Cardiovascular;  Laterality: N/A;   ROTATOR CUFF REPAIR Right    SPINE SURGERY  1995   [place]   TOOTH EXTRACTION  11/13/2021   TOTAL ABDOMINAL HYSTERECTOMY W/ BILATERAL SALPINGOOPHORECTOMY  1974   TOTAL KNEE ARTHROPLASTY Bilateral 2007   Procedure: TOTAL KNEE ARTHROPLASTY; Surgeons: Gerrit Heck, MD and [name], MD   UMBILICAL HERNIA REPAIR  08/11/2015    Home Medications:  Allergies as of 04/08/2023       Reactions   Demeclocycline Hives   Erythromycin Nausea And Vomiting, Other (See Comments)   Severe irritable bowel   Flagyl [metronidazole] Nausea And Vomiting, Other (See Comments)   Severe irritable bowel   Glucophage [metformin Hcl] Nausea And Vomiting, Other (See Comments)   "Sick" "I won't take anything that has metformin in it"   Tetracyclines & Related Hives, Rash   Potassium Chloride Other (See Comments)   Cannot swallow potssium pills, able to tolerate liquid potassium   Tetracycline Other (See Comments)   Diovan [valsartan] Nausea Only       Sulfa Antibiotics Other (See Comments), Rash   As child   Xanax [alprazolam] Other (See Comments)   Hyperactivity         Medication List        Accurate as of April 08, 2023  1:42 PM. If you have any questions, ask your nurse or doctor.          STOP taking these medications    insulin lispro 100 UNIT/ML injection Commonly known as: HumaLOG Stopped by: Lorin Picket A Eduar Kumpf       TAKE these medications    albuterol 108 (90 Base) MCG/ACT  inhaler Commonly known as: VENTOLIN HFA Inhale 1-2 puffs into the lungs every 6 (six) hours as needed for wheezing  or shortness of breath.   apixaban 5 MG Tabs tablet Commonly known as: ELIQUIS Take 1 tablet (5 mg total) by mouth 2 (two) times daily.   atorvastatin 80 MG tablet Commonly known as: LIPITOR Take 1 tablet (80 mg total) by mouth daily.   benzonatate 200 MG capsule Commonly known as: TESSALON Take 1 capsule (200 mg total) by mouth 2 (two) times daily as needed for cough.   budesonide-formoterol 160-4.5 MCG/ACT inhaler Commonly known as: SYMBICORT Inhale 2 puffs into the lungs 2 (two) times daily.   butalbital-acetaminophen-caffeine 50-325-40 MG tablet Commonly known as: FIORICET TAKE 1 TABLET BY MOUTH EVERY 6 HOURS AS NEEDED FOR HEADACHE.   clotrimazole-betamethasone cream Commonly known as: LOTRISONE Apply 1 application topically 2 (two) times daily as needed (irritation).   cyanocobalamin 1000 MCG/ML injection Commonly known as: VITAMIN B12 Inject 1 mL (1,000 mcg total) into the muscle every 30 (thirty) days. Inject 1 ml (1000 mcg ) IM weekly x 4,  Then monthly thereafter   dapagliflozin propanediol 10 MG Tabs tablet Commonly known as: FARXIGA Take 1 tablet (10 mg total) by mouth daily.   dicyclomine 20 MG tablet Commonly known as: BENTYL Take 1 tablet (20 mg total) by mouth 3 (three) times daily before meals.   Droplet Pen Needles 31G X 5 MM Misc Generic drug: Insulin Pen Needle USE ONE NEEDLE SUBCUTANEOUSLY AS DIRECTED (REMOVE AND DISCARD NEEDLE IN SHARPS CONTAINER IMMEDIATELY AFTER USE)   DULoxetine 30 MG capsule Commonly known as: Cymbalta Take 1 capsule (30 mg total) by mouth daily.   ezetimibe 10 MG tablet Commonly known as: ZETIA Take 1 tablet (10 mg total) by mouth daily.   fexofenadine 180 MG tablet Commonly known as: ALLEGRA Take 180 mg by mouth as needed for allergies or rhinitis.   fluticasone 50 MCG/ACT nasal spray Commonly known as:  FLONASE Place 2 sprays into both nostrils daily. What changed:  when to take this reasons to take this   FreeStyle Libre 2 Reader Hardie Pulley Use to check sugar at least TID   Gvoke HypoPen 2-Pack 0.5 MG/0.1ML Soaj Generic drug: Glucagon Inject 1 auto injector subcutaneously as needed for severe hypoglycemia. May repeat x 1 after 15 minutes if needed.   hydrocortisone 25 MG suppository Commonly known as: ANUSOL-HC Place 1 suppository (25 mg total) rectally 2 (two) times daily.   insulin aspart 100 UNIT/ML injection Commonly known as: novoLOG Inject 10 Units into the skin 3 (three) times daily before meals.   lipase/protease/amylase 31540 UNITS Cpep capsule Commonly known as: Creon Take 2 capsules (72,000 Units total) by mouth 3 (three) times daily before meals AND 1 capsule (36,000 Units total) with snacks.   loperamide 2 MG capsule Commonly known as: IMODIUM Take 4-6 mg by mouth 3 (three) times daily before meals.   Luer Lock Safety Syringes 25G X 1" 3 ML Misc Generic drug: SYRINGE-NEEDLE (DISP) 3 ML Use to give b12 injections once monthly   magnesium gluconate 500 MG tablet Commonly known as: MAGONATE Take 500 mg by mouth daily.   metoprolol succinate 25 MG 24 hr tablet Commonly known as: TOPROL-XL Take 0.5 tablets (12.5 mg total) by mouth daily. Take with or immediately following a meal.   nitroGLYCERIN 0.4 MG SL tablet Commonly known as: NITROSTAT Place 1 tablet (0.4 mg total) under the tongue every 5 (five) minutes as needed for chest pain.   nystatin powder Commonly known as: MYCOSTATIN/NYSTOP Apply 1 g topically 4 (four) times daily as needed (rash).   ondansetron  4 MG disintegrating tablet Commonly known as: ZOFRAN-ODT Take 1 tablet (4 mg total) by mouth every 8 (eight) hours as needed for nausea or vomiting.   OneTouch Delica Lancets 33G Misc Used to check blood sugar two times a day.   OneTouch Verio test strip Generic drug: glucose blood Use to check  blood sugar twice daily.   OVER THE COUNTER MEDICATION Apply 1 application  topically daily as needed (pain). [place] Gel topical pain reliever   pantoprazole 40 MG tablet Commonly known as: PROTONIX Take 1 tablet (40 mg total) by mouth daily.   pregabalin 50 MG capsule Commonly known as: LYRICA Take 1 capsule (50 mg total) by mouth 2 (two) times daily.   Premarin vaginal cream Generic drug: conjugated estrogens Apply one pea-sized amount around the opening of the urethra daily for 2 weeks, then 3 times weekly moving forward.   sucralfate 1 g tablet Commonly known as: Carafate Take 1 tablet (1 g total) by mouth 3 (three) times daily before meals.   SYSTANE OP Place 1 drop into both eyes 4 (four) times daily as needed (dry eyes).   torsemide 20 MG tablet Commonly known as: DEMADEX Take 1 tablet (20 mg total) by mouth daily.   Evaristo Bury FlexTouch 100 UNIT/ML FlexTouch Pen Generic drug: insulin degludec Inject 12 Units into the skin daily.   Vitamin D3 250 MCG (10000 UT) capsule Take 10,000 Units by mouth daily.        Allergies:  Allergies  Allergen Reactions   Demeclocycline Hives   Erythromycin Nausea And Vomiting and Other (See Comments)    Severe irritable bowel   Flagyl [Metronidazole] Nausea And Vomiting and Other (See Comments)    Severe irritable bowel   Glucophage [Metformin Hcl] Nausea And Vomiting and Other (See Comments)    "Sick" "I won't take anything that has metformin in it"   Tetracyclines & Related Hives and Rash   Potassium Chloride Other (See Comments)    Cannot swallow potssium pills, able to tolerate liquid potassium   Tetracycline Other (See Comments)   Diovan [Valsartan] Nausea Only        Sulfa Antibiotics Other (See Comments) and Rash    As child   Xanax [Alprazolam] Other (See Comments)    Hyperactivity     Family History: Family History  Problem Relation Age of Onset   Heart disease Father    Hypertension Father    Prostate  cancer Father    Stroke Father    Osteoporosis Father    Stroke Mother    Depression Mother    Headache Mother    Heart disease Mother    Thyroid disease Mother    Hypertension Mother    Diabetes Daughter    Heart disease Daughter    Hypertension Daughter    Hypertension Son     Social History:  reports that she has never smoked. She has never been exposed to tobacco smoke. She has never used smokeless tobacco. She reports that she does not currently use drugs. She reports that she does not drink alcohol.  ROS:                                        Physical Exam: BP 114/76   Pulse 81   Ht 5' 4.5" (1.638 m)   Wt 110 kg   BMI 40.98 kg/m   Constitutional:  Alert and oriented,  No acute distress. HEENT: [place] AT, moist mucus membranes.  Trachea midline, no masses.   Laboratory Data: Lab Results  Component Value Date   WBC 10.9 (H) 04/01/2023   HGB 12.6 04/01/2023   HCT 38.5 04/01/2023   MCV 91.2 04/01/2023   PLT 289 04/01/2023    Lab Results  Component Value Date   CREATININE 1.31 (H) 04/01/2023    No results found for: "PSA"  No results found for: "TESTOSTERONE"  Lab Results  Component Value Date   HGBA1C 7.6 (A) 03/21/2023    Urinalysis    Component Value Date/Time   COLORURINE YELLOW 02/17/2021 1008   APPEARANCEUR Cloudy (A) 05/01/2022 1142   LABSPEC >=1.030 (A) 02/17/2021 1008   LABSPEC 1.006 06/21/2014 1623   PHURINE 5.0 02/17/2021 1008   GLUCOSEU 3+ (A) 05/01/2022 1142   GLUCOSEU 500 (A) 02/17/2021 1008   HGBUR NEGATIVE 02/17/2021 1008   BILIRUBINUR Negative 05/01/2022 1142   BILIRUBINUR Negative 06/21/2014 1623   KETONESUR NEGATIVE 02/17/2021 1008   PROTEINUR Negative 05/01/2022 1142   PROTEINUR NEGATIVE 02/08/2021 1323   UROBILINOGEN 0.2 02/17/2021 1008   NITRITE Negative 05/01/2022 1142   NITRITE NEGATIVE 02/17/2021 1008   LEUKOCYTESUR 1+ (A) 05/01/2022 1142   LEUKOCYTESUR SMALL (A) 02/17/2021 1008   LEUKOCYTESUR  Negative 06/21/2014 1623    Pertinent Imaging: Urine reviewed and sent for culture  Assessment & Plan: I think we need to have reasonable treatment goals.  If this urine culture is positive I will put her on urinary prophylaxis at least for a while but otherwise treat positive cultures as needed.  I will have the patient see the [employer] representative as she is seeing our nurse practitioner a number of times.  I do not think replacing or changing InterStim is the answer based upon the previous operative experience and difficulties, patient's medical issues, and risk factors.  Botox would be an option but she would be at high risk of retention.  Percutaneous tibial nerve stimulation an option.  I had initially thought years ago she was not a good candidate for refractory therapies but the InterStim was placed in [place] in 2015 and she actually did quite well.  It was reasonable to try Gemtesa in the meantime  Patient is already lost 36 pounds in fluid.  She agreed with the above treatment plan.  I gave her the [place], sent urine for culture and will have the rep see her  1. Mixed incontinence  - Urinalysis, Complete   No follow-ups on file.  Martina Sinner, MD  [facility] [address] [place], [place] [address] [phone]

## 2023-04-08 NOTE — Patient Instructions (Signed)
   Lab Work:  Labs done today, your results will be available in MyChart, we will contact you for abnormal readings.     Special Instructions // Education:  Do the following things EVERYDAY: Weigh yourself in the morning before breakfast. Write it down and keep it in a log. Take your medicines as prescribed Eat low salt foods--Limit salt (sodium) to 2000 mg per day.  Stay as active as you can everyday Limit all fluids for the day to less than 2 liters   Follow-Up in: Please follow up as needed.     If you have any questions or concerns before your next appointment please send [place] a message through [place] or call our office at [phone] Monday-Friday 8 am-5 pm.   If you have an urgent need after hours on the weekend please call your Primary Cardiologist or the [facility] in [place] at [phone].

## 2023-04-08 NOTE — Addendum Note (Signed)
Addended by: Electa Sniff on: 04/08/2023 04:02 PM   Modules accepted: Orders

## 2023-04-08 NOTE — Progress Notes (Signed)
Advanced Heart Failure Clinic Note   Referring Physician: recent admit PCP: Sherlene Shams, MD (last seen 06/24) Cardiologist: [name], MD (last seen 09/24)  HPI:  Denise Sharp is a 77 y/o female with a history of HFpEF, CAD status post PCI to the LAD, persistent atrial fibrillation, type 2 diabetes, CKD and morbid obesity presenting today to establish care.  Admitted 03/28/23 due to 30 pound weight gain requiring IV diuresis.  At that time she was taking Lasix 20 mg once daily to once every other day.  She was admitted to [facility] where echocardiogram demonstrated preserved EF with grade 2 diastolic dysfunction.  She was diuresed 4 L through admission and transition to torsemide 20 mg daily at discharge.  Throughout admission she maintained normal sinus rhythm.  She did have rectal bleeding that was secondary to hemorrhoids.  Echo 03/25/12: EF 55-65% with mild AR Echo 08/21/17: EF 65-70% with Grade II DD and mildly elevated PA pressure of 33 mmHg Echo 12/30/19: EF 65-70% with Grade II DD Echo 03/29/23: EF 60-65% with mild LVH, Grade II DD  RHC/ LHC 06/04/22: Mild-moderate, non-obstructive coronary artery disease, similar to prior catheterization in 08/2020. Widely patent ostial LAD stent. Patent mid LAD stent with minimal in stent restenosis (~10% narrowing). Upper normal left and right heart filling pressures. Borderline elevated pulmonary artery pressure. Normal to mildly reduced cardiac output/index.  She presents today for her initial HF Clinic visit.  From a volume standpoint she has been doing relatively well.  Her shortness of breath, PND/orthopnea are much improved after IV diuresis inpatient and transition from furosemide to torsemide.  She does continue to feel very fatigued due to her multiple comorbidities.  She performs all ADLs independently, continues to drive and otherwise her functional status is consistent with NYHA III (limited significantly by her comorbid conditions as  noted above)  Past Medical History:  Diagnosis Date   (HFpEF) heart failure with preserved ejection fraction (HCC)    a. 12/2019 Echo: EF 65-70%, Gr2 DD. No significant valvular dzs.   Abnormality of gait 03/25/2013   Adrenal mass, left (HCC)    Anginal pain (HCC)    Anxiety    Aortic atherosclerosis (HCC)    Arthritis    Asthma    CAD (coronary artery disease) with h/o Atypical Chest Pain    a. 04/1986 Cath ([facility]): nl cors, EF 65%; b. 07/2012 Cath [facility]): Diff minor irregs; c. 07/2016 MV [facility]): "Equivocal"; d. 08/2016 Cardiac CT Ca2+ score [facility]): Ca2+ 1548; e. 05/2019 MV: No ischemia. EF 75%; f. 12/2019 PCI: LM nl, LAD 65ost (3.5x12 Resolute DES), 82m (2.75x12 Resolute DES),LCX/RCA nl; g. 08/2020 Cath: patent LAD stents, RCA 40ost, elev RH pressures, EF >65%.   Cervical spinal stenosis 1994   due to trauma to back ([employer] accident), has intermittent paralysis and parasthesias   Cervicogenic headache 03/23/2014   CKD (chronic kidney disease), stage III (HCC)    Depression    Diverticulosis    Dizziness    a.) chronic   DJD (degenerative joint disease)    a. Chronic R shoulder pain   Dyspnea    Esophageal stenosis 09/2009   a.) transient outlet obstruction by food, cleared by EGD   Family history of adverse reaction to anesthesia    a.) daughter with (+) PONV   Gastric bypass status for obesity    GERD (gastroesophageal reflux disease)    Headache(784.0)    HLD (hyperlipidemia)    Hypertension    IBS (irritable bowel syndrome)  IDA (iron deficiency anemia)    a.) post 2 unit txfsn 2009, normal endo/colonoscopy by [facility]   Left bundle branch block (LBBB)    a.) Intermittently present - likely rate related.   NSVT (nonsustained ventricular tachycardia) (HCC) 11/15/2020   a.) Holter study 11/15/2020; 11 episodes of NSVT lasting up to 8 beats with a maximum rate of 210 bpm; 14 atrial runs lasting up to 18 beats with a rate of up to 250 bpm.  Some atrial runs felt to be NSVT.   NSVT  (nonsustained ventricular tachycardia) (HCC)    a. 10/2020 [facility]: 11 runs of NSVT up to 8 beats.   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 10/2020 [facility]: 14 atrial runs up to 18 beats, max HR 250.   Restless leg syndrome    Rotator cuff arthropathy, right 08/13/2017   Syncope and collapse 03/12/2014   Type II diabetes mellitus (HCC)     Current Outpatient Medications  Medication Sig Dispense Refill   albuterol (VENTOLIN HFA) 108 (90 Base) MCG/ACT inhaler Inhale 1-2 puffs into the lungs every 6 (six) hours as needed for wheezing or shortness of breath. 18 g 2   apixaban (ELIQUIS) 5 MG TABS tablet Take 1 tablet (5 mg total) by mouth 2 (two) times daily. 180 tablet 1   atorvastatin (LIPITOR) 80 MG tablet Take 1 tablet (80 mg total) by mouth daily. 90 tablet 1   benzonatate (TESSALON) 200 MG capsule Take 1 capsule (200 mg total) by mouth 2 (two) times daily as needed for cough. 20 capsule 0   budesonide-formoterol (SYMBICORT) 160-4.5 MCG/ACT inhaler Inhale 2 puffs into the lungs 2 (two) times daily. 1 each 6   butalbital-acetaminophen-caffeine (FIORICET) 50-325-40 MG tablet TAKE 1 TABLET BY MOUTH EVERY 6 HOURS AS NEEDED FOR HEADACHE. 60 tablet 2   Cholecalciferol (VITAMIN D3) 250 MCG (10000 UT) capsule Take 10,000 Units by mouth daily.     clotrimazole-betamethasone (LOTRISONE) cream Apply 1 application topically 2 (two) times daily as needed (irritation).     conjugated estrogens (PREMARIN) vaginal cream Apply one pea-sized amount around the opening of the urethra daily for 2 weeks, then 3 times weekly moving forward. 30 g 4   Continuous Blood Gluc Receiver (FREESTYLE LIBRE 2 READER) DEVI Use to check sugar at least TID 1 each 1   cyanocobalamin (VITAMIN B12) 1000 MCG/ML injection Inject 1 mL (1,000 mcg total) into the muscle every 30 (thirty) days.  Inject 1 ml (1000 mcg ) IM weekly x 4,  Then monthly thereafter 3 mL 1   dapagliflozin propanediol (FARXIGA) 10 MG TABS tablet Take 1 tablet (10 mg total) by mouth daily. 90 tablet 3   dicyclomine (BENTYL) 20 MG tablet Take 1 tablet (20 mg total) by mouth 3 (three) times daily before meals. 180 tablet 2   DULoxetine (CYMBALTA) 30 MG capsule Take 1 capsule (30 mg total) by mouth daily. 90 capsule 4   ezetimibe (ZETIA) 10 MG tablet Take 1 tablet (10 mg total) by mouth daily. 90 tablet 3   fexofenadine (ALLEGRA) 180 MG tablet Take 180 mg by mouth as needed for allergies or rhinitis.     fluticasone (FLONASE) 50 MCG/ACT nasal spray Place 2 sprays into both nostrils daily. 16 g 6   Glucagon (GVOKE HYPOPEN 2-PACK) 0.5 MG/0.1ML SOAJ Inject 1 auto injector subcutaneously as needed  for severe hypoglycemia. May repeat x 1 after 15 minutes if needed. 0.1 mL 2   glucose blood (ONETOUCH VERIO) test strip Use to check blood sugar twice daily. 100 each 1   hydrocortisone (ANUSOL-HC) 25 MG suppository Place 1 suppository (25 mg total) rectally 2 (two) times daily. 24 suppository 5   insulin aspart (NOVOLOG) 100 UNIT/ML injection Inject 10 Units into the skin 3 (three) times daily before meals.     insulin degludec (TRESIBA FLEXTOUCH) 100 UNIT/ML FlexTouch Pen Inject 12 Units into the skin daily. 15 mL 3   Insulin Pen Needle (DROPLET PEN NEEDLES) 31G X 5 MM MISC USE ONE NEEDLE SUBCUTANEOUSLY AS DIRECTED (REMOVE AND DISCARD NEEDLE IN SHARPS CONTAINER IMMEDIATELY AFTER USE) 100 each 1   lipase/protease/amylase (CREON) 36000 UNITS CPEP capsule Take 2 capsules (72,000 Units total) by mouth 3 (three) times daily before meals AND 1 capsule (36,000 Units total) with snacks. 300 capsule 9   loperamide (IMODIUM) 2 MG capsule Take 4-6 mg by mouth 3 (three) times daily before meals.     magnesium gluconate (MAGONATE) 500 MG tablet Take 500 mg by mouth daily.     metoprolol succinate (TOPROL-XL) 25 MG 24 hr tablet Take 0.5  tablets (12.5 mg total) by mouth daily. Take with or immediately following a meal. 45 tablet 3   nitroGLYCERIN (NITROSTAT) 0.4 MG SL tablet Place 1 tablet (0.4 mg total) under the tongue every 5 (five) minutes as needed for chest pain. 25 tablet 0   nystatin (MYCOSTATIN/NYSTOP) powder Apply 1 g topically 4 (four) times daily as needed (rash).     ondansetron (ZOFRAN-ODT) 4 MG disintegrating tablet Take 1 tablet (4 mg total) by mouth every 8 (eight) hours as needed for nausea or vomiting. 30 tablet 0   OneTouch Delica Lancets 33G MISC Used to check blood sugar two times a day. 100 each 4   OVER THE COUNTER MEDICATION Apply 1 application  topically daily as needed (pain). [place] Gel topical pain reliever     pantoprazole (PROTONIX) 40 MG tablet Take 1 tablet (40 mg total) by mouth daily. 90 tablet 3   Polyethyl Glycol-Propyl Glycol (SYSTANE OP) Place 1 drop into both eyes 4 (four) times daily as needed (dry eyes).     pregabalin (LYRICA) 50 MG capsule Take 1 capsule (50 mg total) by mouth 2 (two) times daily. 180 capsule 1   sucralfate (CARAFATE) 1 g tablet Take 1 tablet (1 g total) by mouth 3 (three) times daily before meals. 270 tablet 3   SYRINGE-NEEDLE, DISP, 3 ML (LUER LOCK SAFETY SYRINGES) 25G X 1" 3 ML MISC Use to give b12 injections once monthly 12 each 0   torsemide (DEMADEX) 20 MG tablet Take 1 tablet (20 mg total) by mouth daily. 30 tablet 1   Vibegron (GEMTESA) 75 MG TABS Take 1 tablet (75 mg total) by mouth daily.     No current facility-administered medications for this visit.    Allergies  Allergen Reactions   Demeclocycline Hives   Erythromycin Nausea And Vomiting and Other (See Comments)    Severe irritable bowel   Flagyl [Metronidazole] Nausea And Vomiting and Other (See Comments)    Severe irritable bowel   Glucophage [Metformin Hcl] Nausea And Vomiting and Other (See Comments)    "Sick" "I won't take anything that has metformin in it"   Tetracyclines & Related Hives and  Rash   Potassium Chloride Other (See Comments)    Cannot swallow potssium pills, able to tolerate liquid  potassium   Tetracycline Other (See Comments)   Diovan [Valsartan] Nausea Only        Sulfa Antibiotics Other (See Comments) and Rash    As child   Xanax [Alprazolam] Other (See Comments)    Hyperactivity       Social History   Socioeconomic History   Marital status: Married    Spouse name: Denise Sharp    Number of children: 2   Years of education: College    Highest education level: Not on file  Occupational History    Employer: retired  Tobacco Use   Smoking status: Never    Passive exposure: Never   Smokeless tobacco: Never  Vaping Use   Vaping status: Never Used  Substance and Sexual Activity   Alcohol use: No   Drug use: Not Currently    Comment: prescribed valium   Sexual activity: Not Currently    Birth control/protection: Post-menopausal, Surgical  Other Topics Concern   Not on file  Social History Narrative   Patient lives at home with husband Denise Sharp.    Right Handed   Drinks caffeinated tea occasionally   One pet in house, dog   Social Determinants of Health   Financial Resource Strain: Low Risk  (09/15/2021)   Overall Financial Resource Strain (CARDIA)    Difficulty of Paying Living Expenses: Not hard at all  Food Insecurity: No Food Insecurity (04/05/2023)   Hunger Vital Sign    Worried About Running Out of Food in the Last Year: Never true    Ran Out of Food in the Last Year: Never true  Transportation Needs: No Transportation Needs (04/05/2023)   PRAPARE - Administrator, Civil Service (Medical): No    Lack of Transportation (Non-Medical): No  Physical Activity: Insufficiently Active (11/15/2020)   Exercise Vital Sign    Days of Exercise per Week: 2 days    Minutes of Exercise per Session: 40 min  Stress: Stress Concern Present (08/03/2020)   [employer] of Occupational Health - Occupational Stress Questionnaire    Feeling of Stress :  Rather much  Social Connections: Not on file  Intimate Partner Violence: Not At Risk (03/28/2023)   Humiliation, Afraid, Rape, and Kick questionnaire    Fear of Current or Ex-Partner: No    Emotionally Abused: No    Physically Abused: No    Sexually Abused: No      Family History  Problem Relation Age of Onset   Heart disease Father    Hypertension Father    Prostate cancer Father    Stroke Father    Osteoporosis Father    Stroke Mother    Depression Mother    Headache Mother    Heart disease Mother    Thyroid disease Mother    Hypertension Mother    Diabetes Daughter    Heart disease Daughter    Hypertension Daughter    Hypertension Son       PHYSICAL EXAM: Vitals:   04/08/23 1441  BP: 124/76  Pulse: 77  SpO2: 97%   General:  Well appearing. No respiratory difficulty HEENT: normal Neck: supple. no JVD. Carotids 2+ bilat; no bruits. No lymphadenopathy or thyromegaly appreciated. Cor: PMI nondisplaced. Regular rate & rhythm. No rubs, gallops or murmurs. Lungs: clear Abdomen: soft, nontender, nondistended. No hepatosplenomegaly. No bruits or masses. Good bowel sounds. Extremities: no cyanosis, clubbing, rash, minimal pretibial pitting Neuro: alert & oriented x 3, cranial nerves grossly intact. moves all 4 extremities w/o difficulty. Affect pleasant.  ECG: 03/29/2023: Normal sinus rhythm, personally reviewed.  ASSESSMENT & PLAN:  1: Chronic heart failure with preserved ejection fraction- - suspect due to obesity, atrial fibrillation, vascular disease, hypertension, CKD - NYHA class IIB-III -Mildly hypervolemic on exam.  Continue torsemide 20 mg daily with an additional 20 mg today.  I explained uptitrating to 40 mg as needed for volume overload. - Echo 03/25/12: EF 55-65% with mild AR - Echo 08/21/17: EF 65-70% with Grade II DD and mildly elevated PA pressure of 33 mmHg - Echo 12/30/19: EF 65-70% with Grade II DD - Echo 03/29/23: EF 60-65% with mild LVH, Grade II  DD - continue farxiga 10mg  daily, torsemide 20mg  daily.  - BNP 03/28/23 was 170.0. Will repeat BMP/BNP today. -Repeat BMP/BNP today.  Will start spironolactone in the future.  At this time she is on numerous medications and concerned about side effects.  2: HTN- - BP well-controlled today. - saw PCP Newton Pigg) 06/24 -Repeat labs today.  3: PAF- - saw cardiology (End) 09/24 - continue toprol 25mg  daily  4: OSA- - saw pulmonology Craige Cotta) 08/24  5: Rectal bleeding- - saw GI Gerre Pebbles) 08/24.  -Reports continued chronic rectal bleeding.  Will repeat CBC today.  6: Iron deficiency anemia- - saw hematology Donneta Romberg) 09/24 - hemoglobin 04/01/23 was 12.6  7: DM- - saw endocrinology Lurlean Leyden) 09/24 - A1c 03/21/23 was 7.6%  Dorthula Nettles, DO 04/08/23

## 2023-04-09 NOTE — Addendum Note (Signed)
Addended by: Electa Sniff on: 04/09/2023 03:43 PM   Modules accepted: Orders

## 2023-04-10 ENCOUNTER — Telehealth: Payer: Self-pay | Admitting: Gastroenterology

## 2023-04-10 ENCOUNTER — Ambulatory Visit: Payer: Self-pay

## 2023-04-10 ENCOUNTER — Telehealth: Payer: Self-pay | Admitting: Cardiology

## 2023-04-10 NOTE — Patient Instructions (Addendum)
Visit Information  Thank you for taking time to visit with me today. Please don't hesitate to contact me if I can be of assistance to you.   Following are the goals we discussed today:   Goals Addressed             This Visit's Progress    Post hospital follow up/ management of health conditions.       Interventions Today    Flowsheet Row Most Recent Value  Chronic Disease   Chronic disease during today's visit Congestive Heart Failure (CHF), Other  [rectal bleeding]  General Interventions   General Interventions Discussed/Reviewed General Interventions Reviewed, Doctor Visits, Labs  [evaluation of current treatment plan for HF / rectal bleeding and patients adherence to plan as established by provider. Assessed for HF symptoms and ongoing rectal bleeding.]  Labs --  [Reviewed recent CBC results.]  Doctor Visits Discussed/Reviewed Doctor Visits Reviewed  Algis Downs to keep follow up appointments with providers.]  Exercise Interventions   Exercise Discussed/Reviewed Physical Activity  [Assessed for current physical activity status.]  Education Interventions   Education Provided Provided Education, Provided Printed Education  [Discussed  heart failure signs/ symptoms / action plan.  Advised to contact cardiologist office today to reports 4 lbs weight gain.  Advised to contact GI doctor to reports ongoing rectal bleeding. Education articles on heart failure sent to patient.]  Nutrition Interventions   Nutrition Discussed/Reviewed Nutrition Reviewed, Decreasing salt  Pharmacy Interventions   Pharmacy Dicussed/Reviewed Pharmacy Topics Reviewed  [facility] list reviewed and medication adjustments discussed. Advised to take medications as prescribed. Message sent to primary care provider requesting refill for nitroglycerine.]              Our next appointment is by telephone on 05/02/23 at 2 pm  Please call the care guide team at [phone] if you need to cancel or reschedule your  appointment.   If you are experiencing a Mental Health or Behavioral Health Crisis or need someone to talk to, please call the Suicide and Crisis Lifeline: 988 call 1-800-273-TALK (toll free, 24 hour hotline)  The patient verbalized understanding of instructions, educational materials, and care plan provided today and agreed to receive a mailed copy of patient instructions, educational materials, and care plan.   George Ina RN,BSN,CCM [facility] Care Coordination [phone] direct line  Heart Failure Action Plan A heart failure action plan helps you understand what to do when you have symptoms of heart failure. Your action plan is a color-coded plan that lists the symptoms to watch for and indicates what actions to take. If you have symptoms in the red zone, you need medical care right away. If you have symptoms in the yellow zone, you are having problems. If you have symptoms in the Kastiel Simonian zone, you are doing well. Follow the plan that was created by you and your health care provider. Review your plan each time you visit your health care provider. Red zone These signs and symptoms mean you should get medical help right away: You have trouble breathing when resting. You have a dry cough that is getting worse. You have swelling or pain in your legs or abdomen that is getting worse. You suddenly gain more than 2-3 lb (0.9-1.4 kg) in 24 hours, or more than 5 lb (2.3 kg) in a week. This amount may be more or less depending on your condition. You have trouble staying awake or you feel confused. You have chest pain. You do not have an appetite. You pass  out. You have worsening sadness or depression. If you have any of these symptoms, call your local emergency services (911 in the [place] right away. Do not drive yourself to the hospital. Yellow zone These signs and symptoms mean your condition may be getting worse and you should make some changes: You have trouble breathing when you are active,  or you need to sleep with your head raised on extra pillows to help you breathe. You have swelling in your legs or abdomen. You gain 2-3 lb (0.9-1.4 kg) in 24 hours, or 5 lb (2.3 kg) in a week. This amount may be more or less depending on your condition. You get tired easily. You have trouble sleeping. You have a dry cough. If you have any of these symptoms: Contact your health care provider within the next day. Your health care provider may adjust your medicines. Dylynn Ketner zone These signs mean you are doing well and can continue what you are doing: You do not have shortness of breath. You have very little swelling or no new swelling. Your weight is stable (no gain or loss). You have a normal activity level. You do not have chest pain or any other new symptoms. Follow these instructions at home: Take over-the-counter and prescription medicines only as told by your health care provider. Weigh yourself daily. Your target weight is __________ lb (__________ kg). Call your health care provider if you gain more than __________ lb (__________ kg) in 24 hours, or more than __________ lb (__________ kg) in a week. Health care provider name: _____________________________________________________ Health care provider phone number: _____________________________________________________ Eat a heart-healthy diet. Work with a diet and nutrition specialist (dietitian) to create an eating plan that is best for you. Keep all follow-up visits. This is important. Where to find more information American Heart Association: Summary A heart failure action plan helps you understand what to do when you have symptoms of heart failure. Follow the action plan that was created by you and your health care provider. Get help right away if you have any symptoms in the red zone. This information is not intended to replace advice given to you by your health care provider. Make sure you discuss any questions you have with  your health care provider. Document Revised: 10/10/2021 Document Reviewed: 02/15/2020 Elsevier Patient Education  2024 [employer].

## 2023-04-10 NOTE — Telephone Encounter (Signed)
Patient stated that the nurse at the ED said she needs a earlier appointment with Dr.Wohl because she is having really bad rectal bleeding. I see she has an appointment with Mrs. Gerre Pebbles on 04/18/2023. She said that she will take the appointment on Monday at 1:00 pm.

## 2023-04-10 NOTE — Patient Outreach (Signed)
Care Coordination   Follow Up Visit Note   04/10/2023 Name: Denise Sharp MRN: [id number] DOB: 11/30/1945  Denise Sharp is a 77 y.o. year old female who sees Denise Sharp, Denise Daring, MD for primary care. I spoke with  Denise Sharp by phone today.  What matters to the patients health and wellness today?  Per chart review patient admitted to the hospital 03/28/23 to 04/01/23 for heart failure exacerbation and rectal bleeding.   Patient since discharge from the hospital she has continued to accumulate fluid in her legs and abdomen.  She reports having  follow up visit with cardiologist on 04/08/23 and given medication.  Patient reports being compliant with medication advisement/ adjustment.  She reports gaining 4 more pounds this morning.  Patient states she has not noticed an increase in SOB today because she has not been very active.  Patient states she is not sure which doctor she need to report these symptoms to.  Patient states she is adhering to a strict low salt, heart healthy diet.   Patient reports having ongoing rectal bleeding.  Patient states the bleeding is similar to her being on a day 2 or 3 of her menstrual cycle. She states the bleeding comes and goes.  Patient states she has a follow up visit with her GI doctor on 04/18/23 however is not scheduled with her primary care provider.    Goals Addressed             This Visit's Progress    Post hospital follow up/ management of health conditions.       Interventions Today    Flowsheet Row Most Recent Value  Chronic Disease   Chronic disease during today's visit Congestive Heart Failure (CHF), Other  [rectal bleeding]  General Interventions   General Interventions Discussed/Reviewed General Interventions Reviewed, Doctor Visits, Labs  [evaluation of current treatment plan for HF / rectal bleeding and patients adherence to plan as established by provider. Assessed for HF symptoms and ongoing rectal bleeding.]  Labs --  [Reviewed  recent CBC results.]  Doctor Visits Discussed/Reviewed Doctor Visits Reviewed  Denise Sharp to keep follow up appointments with providers.]  Exercise Interventions   Exercise Discussed/Reviewed Physical Activity  [Assessed for current physical activity status.]  Education Interventions   Education Provided Provided Education, Provided Printed Education  [Discussed  heart failure signs/ symptoms / action plan.  Advised to contact cardiologist office today to reports 4 lbs weight gain.  Advised to contact GI doctor to reports ongoing rectal bleeding. Education articles on heart failure sent to patient.]  Nutrition Interventions   Nutrition Discussed/Reviewed Nutrition Reviewed, Decreasing salt  Pharmacy Interventions   Pharmacy Dicussed/Reviewed Pharmacy Topics Reviewed  [facility] list reviewed and medication adjustments discussed. Advised to take medications as prescribed. Message sent to primary care provider requesting refill for nitroglycerine.]              SDOH assessments and interventions completed:  No     Care Coordination Interventions:  Yes, provided   Follow up plan: Follow up call scheduled for 05/02/23    Encounter Outcome:  Patient Visit Completed   Denise Ina RN,BSN,CCM [facility] Care Coordination [phone] direct line

## 2023-04-11 LAB — CULTURE, URINE COMPREHENSIVE

## 2023-04-12 DIAGNOSIS — I130 Hypertensive heart and chronic kidney disease with heart failure and stage 1 through stage 4 chronic kidney disease, or unspecified chronic kidney disease: Secondary | ICD-10-CM | POA: Diagnosis not present

## 2023-04-12 DIAGNOSIS — E1122 Type 2 diabetes mellitus with diabetic chronic kidney disease: Secondary | ICD-10-CM | POA: Diagnosis not present

## 2023-04-12 DIAGNOSIS — I2510 Atherosclerotic heart disease of native coronary artery without angina pectoris: Secondary | ICD-10-CM | POA: Diagnosis not present

## 2023-04-12 DIAGNOSIS — I4819 Other persistent atrial fibrillation: Secondary | ICD-10-CM | POA: Diagnosis not present

## 2023-04-12 DIAGNOSIS — N183 Chronic kidney disease, stage 3 unspecified: Secondary | ICD-10-CM | POA: Diagnosis not present

## 2023-04-12 DIAGNOSIS — I5033 Acute on chronic diastolic (congestive) heart failure: Secondary | ICD-10-CM | POA: Diagnosis not present

## 2023-04-14 ENCOUNTER — Other Ambulatory Visit: Payer: Self-pay | Admitting: Internal Medicine

## 2023-04-14 MED ORDER — NITROGLYCERIN 0.4 MG SL SUBL: 0.4000 mg | SUBLINGUAL_TABLET | SUBLINGUAL | 5 refills | Status: DC | PRN

## 2023-04-15 ENCOUNTER — Telehealth: Payer: Self-pay | Admitting: Internal Medicine

## 2023-04-15 ENCOUNTER — Telehealth: Payer: Self-pay

## 2023-04-15 ENCOUNTER — Ambulatory Visit (INDEPENDENT_AMBULATORY_CARE_PROVIDER_SITE_OTHER): Payer: Medicare Other | Admitting: Gastroenterology

## 2023-04-15 ENCOUNTER — Encounter: Payer: Self-pay | Admitting: Gastroenterology

## 2023-04-15 VITALS — BP 130/77 | HR 85 | Temp 97.9°F | Wt 244.0 lb

## 2023-04-15 DIAGNOSIS — K625 Hemorrhage of anus and rectum: Secondary | ICD-10-CM | POA: Diagnosis not present

## 2023-04-15 NOTE — Telephone Encounter (Signed)
Pre-operative Risk Assessment    Patient Name: Denise Sharp  DOB: 10/31/1945 MRN: [id number]  Last visit: 03/28/23 Dr.End, 04/08/23 Dr. Gasper Lloyd Next visit: 06/05/23    Request for Surgical Clearance    Procedure:   Colonoscopy  Date of Surgery:  Clearance TBD                                 Surgeon:  not indicated    Surgeon's Group or Practice Name:  [facility] Phone number:  [phone] Fax number:  [phone]   Type of Clearance Requested:   - Medical  - Pharmacy:  Hold Apixaban (Eliquis)     Type of Anesthesia:  General    Additional requests/questions:    Sharen Hones   04/15/2023, 2:09 PM

## 2023-04-15 NOTE — Telephone Encounter (Signed)
Procedure blood thinner clearance faxed to Dr End.. I will schedule colonoscopy for rectal bleeding once clearance has been received

## 2023-04-15 NOTE — Telephone Encounter (Signed)
Left message to schedule IN OFFICE APPT FOR PRE OP CLEARANCE. Pt has appt 06/05/23 with Dr. Okey Dupre. Clearance request came to [place] as TBD. We may need to schedule appt in office sooner as needing pre op clearance as well.

## 2023-04-15 NOTE — Progress Notes (Signed)
Primary Care Physician: Sherlene Shams, MD  Primary Gastroenterologist:  Dr. [name]  Chief Complaint  Patient presents with   Follow-up    HPI: Denise Sharp is a 77 y.o. female here with a history of see me in the past for dysphagia and diarrhea.  The patient was recently in the emergency department with fluid overload and it was reported the patient of rectal bleeding and although the patient had an appointment for next week she stated that the emergency department wanted her seen sooner so she was put in my clinic today.  The patient's hemoglobin and hematocrit were normal. The patient reports that she is on Eliquis for her heart and states that she is doing much better than when she was in the hospital with fluid overload.  She reports that her weight is significantly down.  The patient reports that she had multiple bowel movements while in the hospital with bloody stools and also had blood passing even when she was not going to the bathroom.  The patient denies any bleeding in the last few days and has been using an over-the-counter medication for the hemorrhoids since the prescription she was given was very expensive.  Past Medical History:  Diagnosis Date   (HFpEF) heart failure with preserved ejection fraction (HCC)    a. 12/2019 Echo: EF 65-70%, Gr2 DD. No significant valvular dzs.   Abnormality of gait 03/25/2013   Adrenal mass, left (HCC)    Anginal pain (HCC)    Anxiety    Aortic atherosclerosis (HCC)    Arthritis    Asthma    CAD (coronary artery disease) with h/o Atypical Chest Pain    a. 04/1986 Cath ([facility]): nl cors, EF 65%; b. 07/2012 Cath [facility]): Diff minor irregs; c. 07/2016 MV [facility]): "Equivocal"; d. 08/2016 Cardiac CT Ca2+ score [facility]): Ca2+ 1548; e. 05/2019 MV: No ischemia. EF 75%; f. 12/2019 PCI: LM nl, LAD 65ost (3.5x12 Resolute DES), 26m (2.75x12 Resolute DES),LCX/RCA nl; g. 08/2020 Cath: patent LAD stents, RCA 40ost, elev RH pressures, EF >65%.   Cervical  spinal stenosis 1994   due to trauma to back ([employer] accident), has intermittent paralysis and parasthesias   Cervicogenic headache 03/23/2014   CKD (chronic kidney disease), stage III (HCC)    Depression    Diverticulosis    Dizziness    a.) chronic   DJD (degenerative joint disease)    a. Chronic R shoulder pain   Dyspnea    Esophageal stenosis 09/2009   a.) transient outlet obstruction by food, cleared by EGD   Family history of adverse reaction to anesthesia    a.) daughter with (+) PONV   Gastric bypass status for obesity    GERD (gastroesophageal reflux disease)    Headache(784.0)    HLD (hyperlipidemia)    Hypertension    IBS (irritable bowel syndrome)    IDA (iron deficiency anemia)    a.) post 2 unit txfsn 2009, normal endo/colonoscopy by Dyson Sevey   Left bundle branch block (LBBB)    a.) Intermittently present - likely rate related.   NSVT (nonsustained ventricular tachycardia) (HCC) 11/15/2020   a.) Holter study 11/15/2020; 11 episodes of NSVT lasting up to 8 beats with a maximum rate of 210 bpm; 14 atrial runs lasting up to 18 beats with a rate of up to 250 bpm.  Some atrial runs felt to be NSVT.   NSVT (nonsustained ventricular tachycardia) (HCC)    a. 10/2020 [facility]: 11 runs of NSVT up  to 8 beats.   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 10/2020 [facility]: 14 atrial runs up to 18 beats, max HR 250.   Restless leg syndrome    Rotator cuff arthropathy, right 08/13/2017   Syncope and collapse 03/12/2014   Type II diabetes mellitus (HCC)     Current Outpatient Medications  Medication Sig Dispense Refill   albuterol (VENTOLIN HFA) 108 (90 Base) MCG/ACT inhaler Inhale 1-2 puffs into the lungs every 6 (six) hours as needed for wheezing or shortness of breath. 18 g 2   apixaban (ELIQUIS) 5 MG TABS tablet Take 1 tablet (5 mg total) by  mouth 2 (two) times daily. 180 tablet 1   atorvastatin (LIPITOR) 80 MG tablet Take 1 tablet (80 mg total) by mouth daily. 90 tablet 1   budesonide-formoterol (SYMBICORT) 160-4.5 MCG/ACT inhaler Inhale 2 puffs into the lungs 2 (two) times daily. 1 each 6   butalbital-acetaminophen-caffeine (FIORICET) 50-325-40 MG tablet TAKE 1 TABLET BY MOUTH EVERY 6 HOURS AS NEEDED FOR HEADACHE. 60 tablet 2   Cholecalciferol (VITAMIN D3) 250 MCG (10000 UT) capsule Take 10,000 Units by mouth daily.     clotrimazole-betamethasone (LOTRISONE) cream Apply 1 application topically 2 (two) times daily as needed (irritation).     conjugated estrogens (PREMARIN) vaginal cream Apply one pea-sized amount around the opening of the urethra daily for 2 weeks, then 3 times weekly moving forward. 30 g 4   Continuous Blood Gluc Receiver (FREESTYLE LIBRE 2 READER) DEVI Use to check sugar at least TID 1 each 1   cyanocobalamin (VITAMIN B12) 1000 MCG/ML injection Inject 1 mL (1,000 mcg total) into the muscle every 30 (thirty) days. Inject 1 ml (1000 mcg ) IM weekly x 4,  Then monthly thereafter 3 mL 1   dapagliflozin propanediol (FARXIGA) 10 MG TABS tablet Take 1 tablet (10 mg total) by mouth daily. 90 tablet 3   dicyclomine (BENTYL) 20 MG tablet Take 1 tablet (20 mg total) by mouth 3 (three) times daily before meals. 180 tablet 2   DULoxetine (CYMBALTA) 30 MG capsule Take 1 capsule (30 mg total) by mouth daily. 90 capsule 4   ezetimibe (ZETIA) 10 MG tablet Take 1 tablet (10 mg total) by mouth daily. 90 tablet 3   fexofenadine (ALLEGRA) 180 MG tablet Take 180 mg by mouth as needed for allergies or rhinitis.     fluticasone (FLONASE) 50 MCG/ACT nasal spray Place 2 sprays into both nostrils daily. 16 g 6   Glucagon (GVOKE HYPOPEN 2-PACK) 0.5 MG/0.1ML SOAJ Inject 1 auto injector subcutaneously as needed for severe hypoglycemia. May repeat x 1 after 15 minutes if needed. 0.1 mL 2   glucose blood (ONETOUCH VERIO) test strip Use to check  blood sugar twice daily. 100 each 1   hydrocortisone (ANUSOL-HC) 25 MG suppository Place 1 suppository (25 mg total) rectally 2 (two) times daily. 24 suppository 5   insulin aspart (NOVOLOG) 100 UNIT/ML injection Inject 10 Units into the skin 3 (three) times daily before meals.     insulin degludec (TRESIBA FLEXTOUCH) 100 UNIT/ML FlexTouch Pen Inject 12 Units into the skin daily. 15 mL 3   Insulin Pen Needle (DROPLET PEN NEEDLES) 31G X 5 MM MISC USE ONE NEEDLE SUBCUTANEOUSLY AS DIRECTED (REMOVE AND DISCARD NEEDLE IN SHARPS CONTAINER IMMEDIATELY AFTER USE) 100 each 1   lipase/protease/amylase (CREON)  36000 UNITS CPEP capsule Take 2 capsules (72,000 Units total) by mouth 3 (three) times daily before meals AND 1 capsule (36,000 Units total) with snacks. 300 capsule 9   loperamide (IMODIUM) 2 MG capsule Take 4-6 mg by mouth 3 (three) times daily before meals.     magnesium gluconate (MAGONATE) 500 MG tablet Take 500 mg by mouth daily.     metoprolol succinate (TOPROL-XL) 25 MG 24 hr tablet Take 0.5 tablets (12.5 mg total) by mouth daily. Take with or immediately following a meal. 45 tablet 3   nitroGLYCERIN (NITROSTAT) 0.4 MG SL tablet Place 1 tablet (0.4 mg total) under the tongue every 5 (five) minutes as needed for chest pain. 25 tablet 5   nystatin (MYCOSTATIN/NYSTOP) powder Apply 1 g topically 4 (four) times daily as needed (rash).     ondansetron (ZOFRAN-ODT) 4 MG disintegrating tablet Take 1 tablet (4 mg total) by mouth every 8 (eight) hours as needed for nausea or vomiting. 30 tablet 0   OneTouch Delica Lancets 33G MISC Used to check blood sugar two times a day. 100 each 4   OVER THE COUNTER MEDICATION Apply 1 application  topically daily as needed (pain). [place] Gel topical pain reliever     pantoprazole (PROTONIX) 40 MG tablet Take 1 tablet (40 mg total) by mouth daily. 90 tablet 3   Polyethyl Glycol-Propyl Glycol (SYSTANE OP) Place 1 drop into both eyes 4 (four) times daily as needed (dry eyes).      pregabalin (LYRICA) 50 MG capsule Take 1 capsule (50 mg total) by mouth 2 (two) times daily. 180 capsule 1   sucralfate (CARAFATE) 1 g tablet Take 1 tablet (1 g total) by mouth 3 (three) times daily before meals. 270 tablet 3   SYRINGE-NEEDLE, DISP, 3 ML (LUER LOCK SAFETY SYRINGES) 25G X 1" 3 ML MISC Use to give b12 injections once monthly 12 each 0   torsemide (DEMADEX) 20 MG tablet Take 1 tablet (20 mg total) by mouth daily. 30 tablet 1   Vibegron (GEMTESA) 75 MG TABS Take 1 tablet (75 mg total) by mouth daily.     No current facility-administered medications for this visit.    Allergies as of 04/15/2023 - Review Complete 04/15/2023  Allergen Reaction Noted   Demeclocycline Hives 09/18/2013   Erythromycin Nausea And Vomiting and Other (See Comments) 08/17/2011   Flagyl [metronidazole] Nausea And Vomiting and Other (See Comments) 08/17/2011   Glucophage [metformin hcl] Nausea And Vomiting and Other (See Comments) 08/17/2011   Tetracyclines & related Hives and Rash 03/12/2011   Potassium chloride Other (See Comments) 03/29/2023   Tetracycline Other (See Comments) 09/27/2022   Diovan [valsartan] Nausea Only 08/17/2011   Sulfa antibiotics Other (See Comments) and Rash 09/18/2013   Xanax [alprazolam] Other (See Comments) 08/17/2011    ROS:  General: Negative for anorexia, weight loss, fever, chills, fatigue, weakness. ENT: Negative for hoarseness, difficulty swallowing , nasal congestion. CV: Negative for chest pain, angina, palpitations, dyspnea on exertion, peripheral edema.  Respiratory: Negative for dyspnea at rest, dyspnea on exertion, cough, sputum, wheezing.  GI: See history of present illness. GU:  Negative for dysuria, hematuria, urinary incontinence, urinary frequency, nocturnal urination.  Endo: Negative for unusual weight change.    Physical Examination:   BP 130/77 (BP Location: Left Arm, Patient Position: Sitting, Cuff Size: Normal)   Pulse 85   Temp 97.9 F (36.6  C) (Oral)   Wt 244 lb (110.7 kg)   BMI 41.24 kg/m   General: Well-nourished,  well-developed in no acute distress.  Eyes: No icterus. Conjunctivae pink. Extremities: No lower extremity edema. No clubbing or deformities. Neuro: Alert and oriented x 3.  Grossly intact. Skin: Warm and dry, no jaundice.   Psych: Alert and cooperative, normal mood and affect.  Labs:    Imaging Studies: CT ABDOMEN PELVIS W CONTRAST  Result Date: 03/29/2023 CLINICAL DATA:  Right abdominal pain EXAM: CT ABDOMEN AND PELVIS WITH CONTRAST TECHNIQUE: Multidetector CT imaging of the abdomen and pelvis was performed using the standard protocol following bolus administration of intravenous contrast. RADIATION DOSE REDUCTION: This exam was performed according to the departmental dose-optimization program which includes automated exposure control, adjustment of the mA and/or kV according to patient size and/or use of iterative reconstruction technique. CONTRAST:  OMNIPAQUE IOHEXOL 350 MG/ML SOLN COMPARISON:  None Available. FINDINGS: Lower chest: Small bilateral pleural effusions. Associated lower lobe atelectasis. Hepatobiliary: Liver is within normal limits. Status post cholecystectomy. No intrahepatic or extrahepatic dilatation. Pancreas: Within normal limits. Spleen: Within normal limits. Adrenals/Urinary Tract: 17 mm left adrenal nodule (series 2/image 22), likely reflecting a benign adrenal adenoma, but indeterminate. Right adrenal gland is within normal limits. Kidneys are within normal limits, noting simple bilateral renal sinus cysts, benign (Bosniak I). No follow-up recommended. No hydronephrosis. Bladder is underdistended but unremarkable. Stomach/Bowel: Status post hiatal hernia repair. Status post gastric bypass with patent gastrojejunostomy. No evidence of bowel obstruction. Appendix is not discretely visualized. No colonic wall thickening or inflammatory changes. Vascular/Lymphatic: No evidence of abdominal  aortic aneurysm. Atherosclerotic calcifications of the abdominal aorta and branch vessels, although vessels remain patent. No suspicious abdominopelvic lymphadenopathy. Reproductive: Status post hysterectomy. Bilateral ovaries are within normal limits. Other: No abdominopelvic ascites. Postsurgical changes along the anterior abdominal wall. Musculoskeletal: Degenerative changes of the visualized thoracolumbar spine. Sacral stimulator. IMPRESSION: No CT findings to account for the patient's right abdominal pain. 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 Consider follow-up CT or MR abdomen with/without contrast in 1 year, as clinically warranted. Small bilateral pleural effusions. Electronically Signed   By: Charline Bills M.D.   On: 03/29/2023 22:55   ECHOCARDIOGRAM COMPLETE  Result Date: 03/29/2023    ECHOCARDIOGRAM REPORT   Patient Name:   Denise Sharp Date of Exam: 03/29/2023 Medical Rec #:  [id number]         Height:       64.0 in Accession #:    [id number]        Weight:       258.2 lb Date of Birth:  07/15/1946         BSA:          2.181 m Patient Age:    77 years          BP:           100/60 mmHg Patient Gender: F                 HR:           79 bpm. Exam Location:  [facility] Procedure: 2D Echo, Color Doppler and Cardiac Doppler Indications:     CHF-acute diastolic  History:         Patient has prior history of Echocardiogram examinations, most                  recent 12/29/2000. Arrythmias:LBBB; Risk Factors:Hypertension.  Sonographer:     Cristela Blue Referring Phys:  [id number] Verdene Lennert Diagnosing Phys: Debbe Odea MD  IMPRESSIONS  1. Left ventricular ejection fraction, by estimation, is 60 to 65%. The left ventricle has normal function. The left ventricle has no regional wall motion abnormalities. There is mild left ventricular hypertrophy. Left ventricular diastolic parameters are consistent with Grade II diastolic dysfunction  (pseudonormalization).  2. Right ventricular systolic function is normal. The right ventricular size is normal.  3. Left atrial size was mild to moderately dilated.  4. Right atrial size was mildly dilated.  5. The mitral valve is degenerative. No evidence of mitral valve regurgitation.  6. The aortic valve was not well visualized. Aortic valve regurgitation is mild. FINDINGS  Left Ventricle: Left ventricular ejection fraction, by estimation, is 60 to 65%. The left ventricle has normal function. The left ventricle has no regional wall motion abnormalities. The left ventricular internal cavity size was normal in size. There is  mild left ventricular hypertrophy. Left ventricular diastolic parameters are consistent with Grade II diastolic dysfunction (pseudonormalization). Right Ventricle: The right ventricular size is normal. No increase in right ventricular wall thickness. Right ventricular systolic function is normal. Left Atrium: Left atrial size was mild to moderately dilated. Right Atrium: Right atrial size was mildly dilated. Pericardium: There is no evidence of pericardial effusion. Mitral Valve: The mitral valve is degenerative in appearance. Mild to moderate mitral annular calcification. No evidence of mitral valve regurgitation. MV peak gradient, 4.8 mmHg. The mean mitral valve gradient is 2.0 mmHg. Tricuspid Valve: The tricuspid valve is normal in structure. Tricuspid valve regurgitation is not demonstrated. Aortic Valve: The aortic valve was not well visualized. Aortic valve regurgitation is mild. Aortic valve mean gradient measures 4.0 mmHg. Aortic valve peak gradient measures 7.0 mmHg. Aortic valve area, by VTI measures 3.30 cm. Pulmonic Valve: The pulmonic valve was not well visualized. Pulmonic valve regurgitation is not visualized. Aorta: The aortic root is normal in size and structure. Venous: The inferior vena cava was not well visualized. IAS/Shunts: No atrial level shunt detected by color flow  Doppler.  LEFT VENTRICLE PLAX 2D LVIDd:         4.50 cm   Diastology LVIDs:         2.80 cm   LV e' medial:   6.42 cm/s LV PW:         1.80 cm   LV E/e' medial: 18.2 LV IVS:        1.10 cm LVOT diam:     2.00 cm LV SV:         79 LV SV Index:   36 LVOT Area:     3.14 cm  RIGHT VENTRICLE RV Basal diam:  4.00 cm RV Mid diam:    3.30 cm LEFT ATRIUM           Index        RIGHT ATRIUM           Index LA diam:      4.60 cm 2.11 cm/m   RA Area:     13.40 cm LA Vol (A2C): 40.6 ml 18.62 ml/m  RA Volume:   32.80 ml  15.04 ml/m LA Vol (A4C): 89.6 ml 41.09 ml/m  AORTIC VALVE AV Area (Vmax):    2.22 cm AV Area (Vmean):   2.14 cm AV Area (VTI):     3.30 cm AV Vmax:           132.00 cm/s AV Vmean:          98.800 cm/s AV VTI:  0.239 m AV Peak Grad:      7.0 mmHg AV Mean Grad:      4.0 mmHg LVOT Vmax:         93.40 cm/s LVOT Vmean:        67.200 cm/s LVOT VTI:          0.251 m LVOT/AV VTI ratio: 1.05  AORTA Ao Root diam: 2.80 cm MITRAL VALVE MV Area (PHT): 2.94 cm     SHUNTS MV Area VTI:   2.06 cm     Systemic VTI:  0.25 m MV Peak grad:  4.8 mmHg     Systemic Diam: 2.00 cm MV Mean grad:  2.0 mmHg MV Vmax:       1.10 m/s MV Vmean:      57.2 cm/s MV Decel Time: 258 msec MV E velocity: 117.00 cm/s MV A velocity: 80.20 cm/s MV E/A ratio:  1.46 Debbe Odea MD Electronically signed by Debbe Odea MD Signature Date/Time: 03/29/2023/3:30:39 PM    Final    DG Chest 1 View  Result Date: 03/28/2023 CLINICAL DATA:  Shortness of breath EXAM: CHEST  1 VIEW COMPARISON:  12/13/2022 FINDINGS: Cardiomegaly with vascular congestion and diffuse increased interstitial opacities suspicious for edema. Probable small pleural effusions. No pneumothorax. Right shoulder replacement. IMPRESSION: Cardiomegaly with vascular congestion and probable interstitial edema. Possible small pleural effusions Electronically Signed   By: Jasmine Pang M.D.   On: 03/28/2023 15:48    Assessment and Plan:   Denise Sharp is a 77  y.o. y/o female who comes in today with a history of rectal bleeding while in the hospital.  The patient is on Eliquis.  The patient has not had a colonoscopy in many years.  The bleeding may be hemorrhoidal but prior to sending the patient for banding I have requested that the patient undergo a colonoscopy.  The patient will be set up for colonoscopy due to the rectal bleeding.  The patient has been explained the plan and agrees with it.     [name], MD. Clementeen Graham    Note: This dictation was prepared with Dragon dictation along with smaller phrase technology. Any transcriptional errors that result from this process are unintentional.

## 2023-04-15 NOTE — Telephone Encounter (Signed)
Name: Denise Sharp  DOB: 1945-11-02  MRN: [id number]  Primary Cardiologist: [name], MD  Chart reviewed as part of pre-operative protocol coverage. Because of Denise Sharp's past medical history and time since last visit, she will require a follow-up in-office visit in order to better assess preoperative cardiovascular risk.  Pre-op covering staff: - Please schedule appointment and call patient to inform them. If patient already had an upcoming appointment within acceptable timeframe, please add "pre-op clearance" to the appointment notes so provider is aware. - Please contact requesting surgeon's office via preferred method (i.e, phone, fax) to inform them of need for appointment prior to surgery.  This message will also be routed to pharmacy  for input on holding Eliquis as requested below so that this information is available to the clearing provider at time of patient's appointment.   Napoleon Form, Leodis Rains, NP  04/15/2023, 2:30 PM

## 2023-04-15 NOTE — Telephone Encounter (Signed)
Pt c/o swelling/edema: STAT if pt has developed SOB within 24 hours  If swelling, where is the swelling located? Pt states both swellings in both legs.   How much weight have you gained and in what time span? 4lbs in a week. States she is not going to the bathroom very often.   Have you gained 2 pounds in a day or 5 pounds in a week? States she gained 3lbs in a day.   Do you have a log of your daily weights (if so, list)? Doesn't have it with her she is in the car, and the log is at thome.   Are you currently taking a fluid pill? yes  Are you currently SOB? For the past two days, but has noticed it more today because she is out running errands.   Have you traveled recently in a car or plane for an extended period of time? no  Patient states the weekend before she took double the dose of her fluid pill and lost about 5lbs.

## 2023-04-16 ENCOUNTER — Telehealth: Payer: Self-pay

## 2023-04-16 ENCOUNTER — Telehealth (HOSPITAL_COMMUNITY): Payer: Self-pay | Admitting: Cardiology

## 2023-04-16 NOTE — Telephone Encounter (Signed)
Patient left VM on triage line with reports of slow weight gain x 3 days, increased SOB and mild CP on 9/30  Would like to increase torsemide to 40 mg daily   Attempted to contact pt for additional details No answer unable to LM   See mychart message

## 2023-04-16 NOTE — Telephone Encounter (Signed)
Patient with diagnosis of afib and PE on Eliquis for anticoagulation.    Procedure: colonoscopy Date of procedure: TBD  CHA2DS2-VASc Score = 7  This indicates a 11.2% annual risk of stroke. The patient's score is based upon: CHF History: 1 HTN History: 1 Diabetes History: 1 Stroke History: 0 Vascular Disease History: 1 Age Score: 2 Gender Score: 1   PE added to PMH in 11/2020 but cannot see details.  CrCl 57mL/min using adjusted body weight Platelet count 313K  Per office protocol, patient can hold Eliquis for 1-2 days prior to procedure. Resume as soon as safely possible given elevated CV risk.   **This guidance is not considered finalized until pre-operative APP has relayed final recommendations.**

## 2023-04-16 NOTE — Telephone Encounter (Signed)
Dr Sherron Monday asked to have [employer] representative to come and meet patient in regards to her interstim, she is having an issue. I called Theron Arista with [employer] at [phone] and he will reach out to the patient and set up a time to come and meet her at our office to take a look at the interstim.

## 2023-04-16 NOTE — Telephone Encounter (Signed)
Attempted to contact pt. Left message stating nurse will forward message to heart failure clinic for review.

## 2023-04-16 NOTE — Telephone Encounter (Signed)
Called pt. NO Answer. Left vm with all back number.  My chart msg sent on 04/11/23 as follows:  Hello Mrs. Denise Sharp,  I have tried calling you. And left you a voicemail.  Per Dr. Gasper Lloyd notes from last visit.  You may take 40 mg (2 tablets) of torsemide as needed for volume overload.  You may give [place] a call back at [phone]  Thank you.

## 2023-04-17 ENCOUNTER — Telehealth: Payer: Self-pay

## 2023-04-17 ENCOUNTER — Other Ambulatory Visit: Payer: Self-pay

## 2023-04-17 ENCOUNTER — Other Ambulatory Visit
Admission: RE | Admit: 2023-04-17 | Discharge: 2023-04-17 | Disposition: A | Discharge status: Home / Self Care | Payer: Medicare Other | Source: Ambulatory Visit | Attending: Family | Admitting: Family

## 2023-04-17 DIAGNOSIS — I130 Hypertensive heart and chronic kidney disease with heart failure and stage 1 through stage 4 chronic kidney disease, or unspecified chronic kidney disease: Secondary | ICD-10-CM | POA: Diagnosis not present

## 2023-04-17 DIAGNOSIS — E1122 Type 2 diabetes mellitus with diabetic chronic kidney disease: Secondary | ICD-10-CM | POA: Diagnosis not present

## 2023-04-17 DIAGNOSIS — I2510 Atherosclerotic heart disease of native coronary artery without angina pectoris: Secondary | ICD-10-CM | POA: Diagnosis not present

## 2023-04-17 DIAGNOSIS — I4819 Other persistent atrial fibrillation: Secondary | ICD-10-CM | POA: Diagnosis not present

## 2023-04-17 DIAGNOSIS — I5033 Acute on chronic diastolic (congestive) heart failure: Secondary | ICD-10-CM | POA: Diagnosis not present

## 2023-04-17 DIAGNOSIS — N183 Chronic kidney disease, stage 3 unspecified: Secondary | ICD-10-CM | POA: Diagnosis not present

## 2023-04-17 DIAGNOSIS — I5032 Chronic diastolic (congestive) heart failure: Secondary | ICD-10-CM

## 2023-04-17 LAB — BASIC METABOLIC PANEL
Anion gap: 9 (ref 5–15)
BUN: 41 mg/dL — ABNORMAL HIGH (ref 8–23)
CO2: 25 mmol/L (ref 22–32)
Calcium: 8.5 mg/dL — ABNORMAL LOW (ref 8.9–10.3)
Chloride: 100 mmol/L (ref 98–111)
Creatinine, Ser: 1.58 mg/dL — ABNORMAL HIGH (ref 0.44–1.00)
GFR, Estimated: 34 mL/min — ABNORMAL LOW (ref >60)
Glucose, Bld: 210 mg/dL — ABNORMAL HIGH (ref 70–99)
Potassium: 3.9 mmol/L (ref 3.5–5.1)
Sodium: 134 mmol/L — ABNORMAL LOW (ref 135–145)

## 2023-04-17 NOTE — Telephone Encounter (Signed)
I spoke with Denise Sharp from [facility]. She states that colonoscopy is not urgent and that it can wait until after her 11/20 appt.

## 2023-04-17 NOTE — Telephone Encounter (Signed)
Lvm with requesting office asking if pt needs an earlier appt.

## 2023-04-17 NOTE — Telephone Encounter (Signed)
Spoke with Clydie Braun in Cardiology at pre-op informing us patient would need an office visit for her procedure clearance. Appt will not be until November. They will send clearance once completed.

## 2023-04-18 ENCOUNTER — Ambulatory Visit: Payer: Medicare Other | Admitting: Physician Assistant

## 2023-04-19 ENCOUNTER — Ambulatory Visit: Payer: Self-pay | Admitting: *Deleted

## 2023-04-19 ENCOUNTER — Telehealth: Payer: Self-pay | Admitting: Family

## 2023-04-19 DIAGNOSIS — I4819 Other persistent atrial fibrillation: Secondary | ICD-10-CM | POA: Diagnosis not present

## 2023-04-19 DIAGNOSIS — I110 Hypertensive heart disease with heart failure: Secondary | ICD-10-CM | POA: Diagnosis not present

## 2023-04-19 DIAGNOSIS — K8681 Exocrine pancreatic insufficiency: Secondary | ICD-10-CM | POA: Diagnosis not present

## 2023-04-19 DIAGNOSIS — I130 Hypertensive heart and chronic kidney disease with heart failure and stage 1 through stage 4 chronic kidney disease, or unspecified chronic kidney disease: Secondary | ICD-10-CM | POA: Diagnosis not present

## 2023-04-19 DIAGNOSIS — G2581 Restless legs syndrome: Secondary | ICD-10-CM | POA: Diagnosis not present

## 2023-04-19 DIAGNOSIS — I5033 Acute on chronic diastolic (congestive) heart failure: Secondary | ICD-10-CM | POA: Diagnosis not present

## 2023-04-19 DIAGNOSIS — N183 Chronic kidney disease, stage 3 unspecified: Secondary | ICD-10-CM | POA: Diagnosis not present

## 2023-04-19 DIAGNOSIS — I2510 Atherosclerotic heart disease of native coronary artery without angina pectoris: Secondary | ICD-10-CM | POA: Diagnosis not present

## 2023-04-19 DIAGNOSIS — E1142 Type 2 diabetes mellitus with diabetic polyneuropathy: Secondary | ICD-10-CM | POA: Diagnosis not present

## 2023-04-19 DIAGNOSIS — D509 Iron deficiency anemia, unspecified: Secondary | ICD-10-CM | POA: Diagnosis not present

## 2023-04-19 DIAGNOSIS — E1122 Type 2 diabetes mellitus with diabetic chronic kidney disease: Secondary | ICD-10-CM | POA: Diagnosis not present

## 2023-04-19 DIAGNOSIS — G4733 Obstructive sleep apnea (adult) (pediatric): Secondary | ICD-10-CM | POA: Diagnosis not present

## 2023-04-19 NOTE — Patient Outreach (Signed)
Care Coordination   Follow Up Visit Note   04/19/2023 Name: Denise Sharp MRN: [id number] DOB: [birth year]  Denise Sharp is a 77 y.o. year old female who sees Darrick Huntsman, Mar Daring, MD for primary care. I spoke with  Denise Sharp by phone today.  What matters to the patients health and wellness today?  Resources for housekeeping      Goals Addressed             This Visit's Progress    Community resource needs       Interventions Today    Flowsheet Row Most Recent Value  Chronic Disease   Chronic disease during today's visit Congestive Heart Failure (CHF)  General Interventions   General Interventions Discussed/Reviewed General Interventions Reviewed, [employer] requesting assistance with housekeeping, commercial resources discussed-confirmed that  patient's spouse is service connected encouraged patient to reach out to the [place] to discuss applying for the [employer] and Attendance Program]              SDOH assessments and interventions completed:  No     Care Coordination Interventions:  Yes, provided   Follow up plan: No further intervention required.   Encounter Outcome:  Patient Visit Completed

## 2023-04-19 NOTE — Patient Instructions (Addendum)
Visit Information  Thank you for taking time to visit with me today. Please don't hesitate to contact me if I can be of assistance to you.   Following are the goals we discussed today:  Please follow up with the [facility] for possible enrollment in the Aid and Attendance Program for in home support Please reach out to your provider with any questions related to your medical care   If you are experiencing a Mental Health or Behavioral Health Crisis or need someone to talk to, please call 911   Patient verbalizes understanding of instructions and care plan provided today and agrees to view in MyChart. Active MyChart status and patient understanding of how to access instructions and care plan via MyChart confirmed with patient.     No further follow up required: patient to contact this Child psychotherapist with any additional community resource needs  [employer], [employer] [facility]  [facility] Licensed Clinical Social Geologist, engineering Dial: [phone]

## 2023-04-23 ENCOUNTER — Telehealth: Payer: Self-pay | Admitting: Internal Medicine

## 2023-04-23 DIAGNOSIS — N183 Chronic kidney disease, stage 3 unspecified: Secondary | ICD-10-CM | POA: Diagnosis not present

## 2023-04-23 DIAGNOSIS — I4819 Other persistent atrial fibrillation: Secondary | ICD-10-CM | POA: Diagnosis not present

## 2023-04-23 DIAGNOSIS — I2510 Atherosclerotic heart disease of native coronary artery without angina pectoris: Secondary | ICD-10-CM | POA: Diagnosis not present

## 2023-04-23 DIAGNOSIS — I130 Hypertensive heart and chronic kidney disease with heart failure and stage 1 through stage 4 chronic kidney disease, or unspecified chronic kidney disease: Secondary | ICD-10-CM | POA: Diagnosis not present

## 2023-04-23 DIAGNOSIS — Z0279 Encounter for issue of other medical certificate: Secondary | ICD-10-CM

## 2023-04-23 DIAGNOSIS — E1122 Type 2 diabetes mellitus with diabetic chronic kidney disease: Secondary | ICD-10-CM | POA: Diagnosis not present

## 2023-04-23 DIAGNOSIS — I5033 Acute on chronic diastolic (congestive) heart failure: Secondary | ICD-10-CM | POA: Diagnosis not present

## 2023-04-23 NOTE — Telephone Encounter (Signed)
Recevied FMLA paperwork from patient, collected $29. Faxed billing form to billing, and scanned incomplete forms into chart. Placed forms in Dr. Serita Kyle nurse box for completion.

## 2023-04-24 ENCOUNTER — Other Ambulatory Visit (HOSPITAL_COMMUNITY): Payer: Self-pay

## 2023-04-24 ENCOUNTER — Ambulatory Visit: Payer: Medicare Other | Attending: Cardiology | Admitting: Cardiology

## 2023-04-24 ENCOUNTER — Telehealth: Payer: Self-pay | Admitting: Family

## 2023-04-24 VITALS — BP 120/66 | HR 68 | Ht 64.5 in | Wt 249.0 lb

## 2023-04-24 DIAGNOSIS — N183 Chronic kidney disease, stage 3 unspecified: Secondary | ICD-10-CM | POA: Diagnosis not present

## 2023-04-24 DIAGNOSIS — I4819 Other persistent atrial fibrillation: Secondary | ICD-10-CM | POA: Diagnosis not present

## 2023-04-24 DIAGNOSIS — E1142 Type 2 diabetes mellitus with diabetic polyneuropathy: Secondary | ICD-10-CM | POA: Diagnosis not present

## 2023-04-24 DIAGNOSIS — I10 Essential (primary) hypertension: Secondary | ICD-10-CM

## 2023-04-24 DIAGNOSIS — I5032 Chronic diastolic (congestive) heart failure: Secondary | ICD-10-CM | POA: Diagnosis not present

## 2023-04-24 DIAGNOSIS — K625 Hemorrhage of anus and rectum: Secondary | ICD-10-CM

## 2023-04-24 DIAGNOSIS — E1122 Type 2 diabetes mellitus with diabetic chronic kidney disease: Secondary | ICD-10-CM | POA: Diagnosis not present

## 2023-04-24 MED ORDER — TORSEMIDE 20 MG PO TABS: 20.0000 mg | ORAL_TABLET | Freq: Every day | ORAL | 3 refills | Status: DC

## 2023-04-24 MED ORDER — KERENDIA 10 MG PO TABS: 10.0000 mg | ORAL_TABLET | Freq: Every morning | ORAL | 3 refills | Status: DC

## 2023-04-24 MED ORDER — TORSEMIDE 20 MG PO TABS: 40.0000 mg | ORAL_TABLET | Freq: Every day | ORAL | 0 refills | Status: DC

## 2023-04-24 NOTE — Patient Instructions (Addendum)
Medication Changes:  Increase Torsemide to 60 mg (3 tablets) for 3 days.  Then Please take Torsemide 40 mg (2 tablets) daily.   30 day supply to: [id number] [place], [place] - [address] [place], [place] [address] Phone: [phone]   90 day supply to : [facility] - [place], [place] - [address] [facility] [address] Phone: [phone]    Lab Work:  You will have labs in 10 days. Please come to the medical mall entrance to have your labs completed before 4:30 pm.    Special Instructions // Education:  Do the following things EVERYDAY: Weigh yourself in the morning before breakfast. Write it down and keep it in a log. Take your medicines as prescribed Eat low salt foods--Limit salt (sodium) to 2000 mg per day.  Stay as active as you can everyday Limit all fluids for the day to less than 2 liters   Follow-Up in: follow up in 3 months with Clarisa Kindred FNP.    If you have any questions or concerns before your next appointment please send [place] a message through [place] or call our office at [phone] Monday-Friday 8 am-5 pm.   If you have an urgent need after hours on the weekend please call your Primary Cardiologist or the Advanced [facility] Failure [facility] in [place] at [phone].   At the Advanced [facility] Failure Clinic, you and your health needs are our priority. We have a designated team specialized in the treatment of Heart Failure. This Care Team includes your primary Heart Failure Specialized Cardiologist (physician), Advanced Practice Providers (APPs- Physician Assistants and Nurse Practitioners), and Pharmacist who all work together to provide you with the care you need, when you need it.   You may see any of the following providers on your designated Care Team at your next follow up:  Dr. Arvilla Meres Dr. Marca Ancona Dr. Dorthula Nettles Dr. Theresia Bough Tonye Becket, NP Robbie Lis, [place] [address] [place], [place] Brynda Peon, NP [place] Lee, NP Clarisa Kindred, NP Enos Fling, PharmD

## 2023-04-24 NOTE — Progress Notes (Signed)
Advanced Heart Failure Clinic Note   Referring Physician: recent admit PCP: Sherlene Shams, MD (last seen 06/24) Cardiologist: [name], MD (last seen 09/24)  HPI:  Ms Wooding is a 77 y/o female with a history of HFpEF, CAD status post PCI to the LAD, persistent atrial fibrillation, type 2 diabetes, CKD and morbid obesity presenting today for follow up.  Admitted 03/28/23 due to 30 pound weight gain requiring IV diuresis.  At that time she was taking Lasix 20 mg once daily to once every other day.  She was admitted to [facility] where echocardiogram demonstrated preserved EF with grade 2 diastolic dysfunction.  She was diuresed 4 L through admission and transition to torsemide 20 mg daily at discharge.  Throughout admission she maintained normal sinus rhythm.  She did have rectal bleeding that was secondary to hemorrhoids.  Echo 03/25/12: EF 55-65% with mild AR Echo 08/21/17: EF 65-70% with Grade II DD and mildly elevated PA pressure of 33 mmHg Echo 12/30/19: EF 65-70% with Grade II DD Echo 03/29/23: EF 60-65% with mild LVH, Grade II DD  RHC/ LHC 06/04/22: Mild-moderate, non-obstructive coronary artery disease, similar to prior catheterization in 08/2020. Widely patent ostial LAD stent. Patent mid LAD stent with minimal in stent restenosis (~10% narrowing). Upper normal left and right heart filling pressures. Borderline elevated pulmonary artery pressure. Normal to mildly reduced cardiac output/index.  Today she presents with worsening shortness of breath and decreased exercise capacity.  She reports having minimal urine output with torsemide 20 mg daily but continues to take take it.  She reports that her urine output improved to torsemide 40 mg daily but she does not like to take it too frequently.  She presents today with 6-10lb weight gain and increasing fatigue. She is growing increasingly tired of having to frequently dose diuretics and frequent physician appointments.   Past Medical  History:  Diagnosis Date   (HFpEF) heart failure with preserved ejection fraction (HCC)    a. 12/2019 Echo: EF 65-70%, Gr2 DD. No significant valvular dzs.   Abnormality of gait 03/25/2013   Adrenal mass, left (HCC)    Anginal pain (HCC)    Anxiety    Aortic atherosclerosis (HCC)    Arthritis    Asthma    CAD (coronary artery disease) with h/o Atypical Chest Pain    a. 04/1986 Cath ([facility]): nl cors, EF 65%; b. 07/2012 Cath [facility]): Diff minor irregs; c. 07/2016 MV [facility]): "Equivocal"; d. 08/2016 Cardiac CT Ca2+ score [facility]): Ca2+ 1548; e. 05/2019 MV: No ischemia. EF 75%; f. 12/2019 PCI: LM nl, LAD 65ost (3.5x12 Resolute DES), 52m (2.75x12 Resolute DES),LCX/RCA nl; g. 08/2020 Cath: patent LAD stents, RCA 40ost, elev RH pressures, EF >65%.   Cervical spinal stenosis 1994   due to trauma to back ([employer] accident), has intermittent paralysis and parasthesias   Cervicogenic headache 03/23/2014   CKD (chronic kidney disease), stage III (HCC)    Depression    Diverticulosis    Dizziness    a.) chronic   DJD (degenerative joint disease)    a. Chronic R shoulder pain   Dyspnea    Esophageal stenosis 09/2009   a.) transient outlet obstruction by food, cleared by EGD   Family history of adverse reaction to anesthesia    a.) daughter with (+) PONV   Gastric bypass status for obesity    GERD (gastroesophageal reflux disease)    Headache(784.0)    HLD (hyperlipidemia)    Hypertension    IBS (irritable bowel syndrome)  IDA (iron deficiency anemia)    a.) post 2 unit txfsn 2009, normal endo/colonoscopy by [facility]   Left bundle branch block (LBBB)    a.) Intermittently present - likely rate related.   NSVT (nonsustained ventricular tachycardia) (HCC) 11/15/2020   a.) Holter study 11/15/2020; 11 episodes of NSVT lasting up to 8 beats with a maximum rate of 210 bpm; 14 atrial runs lasting up to 18 beats with a rate of up to 250 bpm.  Some atrial runs felt to be NSVT.   NSVT (nonsustained ventricular  tachycardia) (HCC)    a. 10/2020 [facility]: 11 runs of NSVT up to 8 beats.   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 10/2020 [facility]: 14 atrial runs up to 18 beats, max HR 250.   Restless leg syndrome    Rotator cuff arthropathy, right 08/13/2017   Syncope and collapse 03/12/2014   Type II diabetes mellitus (HCC)     Current Outpatient Medications  Medication Sig Dispense Refill   apixaban (ELIQUIS) 5 MG TABS tablet Take 1 tablet (5 mg total) by mouth 2 (two) times daily. 180 tablet 1   atorvastatin (LIPITOR) 80 MG tablet Take 1 tablet (80 mg total) by mouth daily. 90 tablet 1   budesonide-formoterol (SYMBICORT) 160-4.5 MCG/ACT inhaler Inhale 2 puffs into the lungs 2 (two) times daily. 1 each 6   butalbital-acetaminophen-caffeine (FIORICET) 50-325-40 MG tablet TAKE 1 TABLET BY MOUTH EVERY 6 HOURS AS NEEDED FOR HEADACHE. 60 tablet 2   Cholecalciferol (VITAMIN D3) 250 MCG (10000 UT) capsule Take 10,000 Units by mouth daily.     clotrimazole-betamethasone (LOTRISONE) cream Apply 1 application topically 2 (two) times daily as needed (irritation).     conjugated estrogens (PREMARIN) vaginal cream Apply one pea-sized amount around the opening of the urethra daily for 2 weeks, then 3 times weekly moving forward. 30 g 4   Continuous Blood Gluc Receiver (FREESTYLE LIBRE 2 READER) DEVI Use to check sugar at least TID 1 each 1   cyanocobalamin (VITAMIN B12) 1000 MCG/ML injection Inject 1 mL (1,000 mcg total) into the muscle every 30 (thirty) days. Inject 1 ml (1000 mcg ) IM weekly x 4,  Then monthly thereafter 3 mL 1   dapagliflozin propanediol (FARXIGA) 10 MG TABS tablet Take 1 tablet (10 mg total) by mouth daily. 90 tablet 3   dicyclomine (BENTYL) 20 MG tablet Take 1 tablet (20 mg total) by mouth 3 (three) times daily before meals. 180 tablet 2   DULoxetine  (CYMBALTA) 30 MG capsule Take 1 capsule (30 mg total) by mouth daily. 90 capsule 4   ezetimibe (ZETIA) 10 MG tablet Take 1 tablet (10 mg total) by mouth daily. 90 tablet 3   fexofenadine (ALLEGRA) 180 MG tablet Take 180 mg by mouth as needed for allergies or rhinitis.     fluticasone (FLONASE) 50 MCG/ACT nasal spray Place 2 sprays into both nostrils daily. 16 g 6   Glucagon (GVOKE HYPOPEN 2-PACK) 0.5 MG/0.1ML SOAJ Inject 1 auto injector subcutaneously as needed for severe hypoglycemia. May repeat x 1 after 15 minutes if needed. 0.1 mL 2   glucose blood (ONETOUCH VERIO) test strip Use to check blood sugar twice daily. 100 each 1   hydrocortisone (ANUSOL-HC) 25 MG suppository Place 1 suppository (25 mg total) rectally 2 (two) times daily. 24 suppository 5   insulin  aspart (NOVOLOG) 100 UNIT/ML injection Inject 10 Units into the skin 3 (three) times daily before meals.     insulin degludec (TRESIBA FLEXTOUCH) 100 UNIT/ML FlexTouch Pen Inject 12 Units into the skin daily. 15 mL 3   Insulin Pen Needle (DROPLET PEN NEEDLES) 31G X 5 MM MISC USE ONE NEEDLE SUBCUTANEOUSLY AS DIRECTED (REMOVE AND DISCARD NEEDLE IN SHARPS CONTAINER IMMEDIATELY AFTER USE) 100 each 1   lipase/protease/amylase (CREON) 36000 UNITS CPEP capsule Take 2 capsules (72,000 Units total) by mouth 3 (three) times daily before meals AND 1 capsule (36,000 Units total) with snacks. 300 capsule 9   loperamide (IMODIUM) 2 MG capsule Take 4-6 mg by mouth 3 (three) times daily before meals.     magnesium gluconate (MAGONATE) 500 MG tablet Take 500 mg by mouth daily.     metoprolol succinate (TOPROL-XL) 25 MG 24 hr tablet Take 0.5 tablets (12.5 mg total) by mouth daily. Take with or immediately following a meal. 45 tablet 3   nitroGLYCERIN (NITROSTAT) 0.4 MG SL tablet Place 1 tablet (0.4 mg total) under the tongue every 5 (five) minutes as needed for chest pain. 25 tablet 5   nystatin (MYCOSTATIN/NYSTOP) powder Apply 1 g topically 4 (four) times  daily as needed (rash).     ondansetron (ZOFRAN-ODT) 4 MG disintegrating tablet Take 1 tablet (4 mg total) by mouth every 8 (eight) hours as needed for nausea or vomiting. 30 tablet 0   OneTouch Delica Lancets 33G MISC Used to check blood sugar two times a day. 100 each 4   OVER THE COUNTER MEDICATION Apply 1 application  topically daily as needed (pain). [place] Gel topical pain reliever     pantoprazole (PROTONIX) 40 MG tablet Take 1 tablet (40 mg total) by mouth daily. 90 tablet 3   Polyethyl Glycol-Propyl Glycol (SYSTANE OP) Place 1 drop into both eyes 4 (four) times daily as needed (dry eyes).     pregabalin (LYRICA) 50 MG capsule Take 1 capsule (50 mg total) by mouth 2 (two) times daily. 180 capsule 1   SYRINGE-NEEDLE, DISP, 3 ML (LUER LOCK SAFETY SYRINGES) 25G X 1" 3 ML MISC Use to give b12 injections once monthly 12 each 0   torsemide (DEMADEX) 20 MG tablet Take 1 tablet (20 mg total) by mouth daily. 30 tablet 1   albuterol (VENTOLIN HFA) 108 (90 Base) MCG/ACT inhaler Inhale 1-2 puffs into the lungs every 6 (six) hours as needed for wheezing or shortness of breath. (Patient not taking: Reported on 04/24/2023) 18 g 2   No current facility-administered medications for this visit.    Allergies  Allergen Reactions   Demeclocycline Hives   Erythromycin Nausea And Vomiting and Other (See Comments)    Severe irritable bowel   Flagyl [Metronidazole] Nausea And Vomiting and Other (See Comments)    Severe irritable bowel   Glucophage [Metformin Hcl] Nausea And Vomiting and Other (See Comments)    "Sick" "I won't take anything that has metformin in it"   Tetracyclines & Related Hives and Rash   Potassium Chloride Other (See Comments)    Cannot swallow potssium pills, able to tolerate liquid potassium   Tetracycline Other (See Comments)   Diovan [Valsartan] Nausea Only        Sulfa Antibiotics Other (See Comments) and Rash    As child   Xanax [Alprazolam] Other (See Comments)     Hyperactivity       Social History   Socioeconomic History   Marital status: Married  Spouse name: Alinda Money    Number of children: 2   Years of education: College    Highest education level: Not on file  Occupational History    Employer: retired  Tobacco Use   Smoking status: Never    Passive exposure: Never   Smokeless tobacco: Never  Vaping Use   Vaping status: Never Used  Substance and Sexual Activity   Alcohol use: No   Drug use: Not Currently    Comment: prescribed valium   Sexual activity: Not Currently    Birth control/protection: Post-menopausal, Surgical  Other Topics Concern   Not on file  Social History Narrative   Patient lives at home with husband Alinda Money.    Right Handed   Drinks caffeinated tea occasionally   One pet in house, dog   Social Determinants of Health   Financial Resource Strain: Low Risk  (09/15/2021)   Overall Financial Resource Strain (CARDIA)    Difficulty of Paying Living Expenses: Not hard at all  Food Insecurity: No Food Insecurity (04/05/2023)   Hunger Vital Sign    Worried About Running Out of Food in the Last Year: Never true    Ran Out of Food in the Last Year: Never true  Transportation Needs: No Transportation Needs (04/05/2023)   PRAPARE - Administrator, Civil Service (Medical): No    Lack of Transportation (Non-Medical): No  Physical Activity: Insufficiently Active (11/15/2020)   Exercise Vital Sign    Days of Exercise per Week: 2 days    Minutes of Exercise per Session: 40 min  Stress: Stress Concern Present (08/03/2020)   [employer] of Occupational Health - Occupational Stress Questionnaire    Feeling of Stress : Rather much  Social Connections: Not on file  Intimate Partner Violence: Not At Risk (03/28/2023)   Humiliation, Afraid, Rape, and Kick questionnaire    Fear of Current or Ex-Partner: No    Emotionally Abused: No    Physically Abused: No    Sexually Abused: No      Family History  Problem  Relation Age of Onset   Heart disease Father    Hypertension Father    Prostate cancer Father    Stroke Father    Osteoporosis Father    Stroke Mother    Depression Mother    Headache Mother    Heart disease Mother    Thyroid disease Mother    Hypertension Mother    Diabetes Daughter    Heart disease Daughter    Hypertension Daughter    Hypertension Son       PHYSICAL EXAM: Vitals:   04/24/23 1418  BP: 120/66  Pulse: 68  SpO2: 96%   GENERAL: Well nourished, well developed, and in no apparent distress at rest.  HEENT: Negative for arcus senilis or xanthelasma. There is no scleral icterus.  The mucous membranes are pink and moist.   NECK: Supple, No masses. Normal carotid upstrokes without bruits. No masses or thyromegaly.    CHEST: There are no chest wall deformities. There is no chest wall tenderness. Respirations are unlabored.  Lungs- decreased at bases CARDIAC:  JVP: 10 cm          Normal rate with regular rhythm. No murmurs, rubs or gallops.  Pulses are 2+ and symmetrical in upper and lower extremities. 2+ edema to the knees.  ABDOMEN: Soft, non-tender, non-distended. There are no masses or hepatomegaly. There are normal bowel sounds.  EXTREMITIES: Warm and well perfused with no cyanosis, clubbing.  LYMPHATIC: No axillary or supraclavicular lymphadenopathy.  NEUROLOGIC: Patient is oriented x3 with no focal or lateralizing neurologic deficits.  PSYCH: Patients affect is appropriate, there is no evidence of anxiety or depression.  SKIN: Warm and dry; no lesions or wounds.    ECG: 03/29/2023: Normal sinus rhythm, personally reviewed.  ASSESSMENT & PLAN:  1: Chronic heart failure with preserved ejection fraction- - suspect due to obesity, atrial fibrillation, vascular disease, hypertension, CKD; some of her symptoms are now secondary to deconditioning. - NYHA class III - Hypervolemic on exam; reports several lb weight gain. Will plan on  - Echo 03/25/12: EF 55-65%  with mild AR - Echo 08/21/17: EF 65-70% with Grade II DD and mildly elevated PA pressure of 33 mmHg - Echo 12/30/19: EF 65-70% with Grade II DD - Echo 03/29/23: EF 60-65% with mild LVH, Grade II DD - Hypervolemic on exam today with several lb weight gain and lower extremity edema. Will increase torsemide to 60mg  for the next 2 days followed by 40mg  daily. Repeat labs in 10 days.  - Start finerenone 10mg  daily.   2: HTN- - well controlled today - BMP in 10 days.   3: PAF- - saw cardiology (End) 09/24 - continue toprol 25mg  daily  4: OSA- - saw pulmonology Craige Cotta) 08/24  5: Rectal bleeding- - saw GI Gerre Pebbles) 08/24.  - Reports that she has continued intermittent bleeding; she is planning to undergo colonoscopy. From a HF standpoint she is stable to proceed.   6: Iron deficiency anemia- - saw hematology Donneta Romberg) 09/24 - hemoglobin 04/01/23 was 12.6  7: DM- - saw endocrinology Lurlean Leyden) 09/24 - A1c 03/21/23 was 7.6%  8. CKD IIIB - With underlying T2DM, HFpEF will start finerenone 10mg .  - repeat labs in 10 days (BMP/BNP)  Dorthula Nettles, DO 04/24/23

## 2023-04-24 NOTE — Telephone Encounter (Signed)
Pt to come an appointment today.  Spoke with her on the phone. Pt denies questions at this time.

## 2023-04-25 ENCOUNTER — Ambulatory Visit (INDEPENDENT_AMBULATORY_CARE_PROVIDER_SITE_OTHER): Payer: Medicare Other | Admitting: Podiatry

## 2023-04-25 ENCOUNTER — Encounter: Payer: Self-pay | Admitting: Podiatry

## 2023-04-25 ENCOUNTER — Ambulatory Visit: Payer: Medicare Other | Admitting: Internal Medicine

## 2023-04-25 ENCOUNTER — Telehealth: Payer: Self-pay | Admitting: Internal Medicine

## 2023-04-25 DIAGNOSIS — B351 Tinea unguium: Secondary | ICD-10-CM

## 2023-04-25 DIAGNOSIS — E1149 Type 2 diabetes mellitus with other diabetic neurological complication: Secondary | ICD-10-CM

## 2023-04-25 DIAGNOSIS — M79676 Pain in unspecified toe(s): Secondary | ICD-10-CM

## 2023-04-25 NOTE — Telephone Encounter (Signed)
I spoke with Denise Sharp today about her scheduled visit with me.  She was just seen in the heart failure clinic yesterday, which time it was felt that she was volume overloaded with acute on chronic HFpEF.  Torsemide was increased in an effort to improve her symptoms.  She is scheduled for preop evaluation with me today in anticipation of colonoscopy.  Given her concerns for decompensated heart failure yesterday and medication adjustment, I do not think that she is stable for elective procedures at this time.  We have agreed to postpone her appointment with me today and to have her follow-up in 2 to 3 weeks to reassess her volume status and symptoms.  If she is better compensated at that time, we can clear her for colonoscopy.  I will forward this message to Dr. Servando Snare as well for his records.  I will complete FMLA paperwork that she provided to our office, which will be available for pickup later today.  [name], MD [facility]

## 2023-04-25 NOTE — Telephone Encounter (Signed)
Spoke to pt and rescheduled appointment for 05/15/23

## 2023-04-25 NOTE — Telephone Encounter (Signed)
FMLA completed by MD Forms picked up by pt on 04/25/23

## 2023-04-26 ENCOUNTER — Inpatient Hospital Stay: Payer: Medicare Other | Attending: Internal Medicine

## 2023-04-26 ENCOUNTER — Telehealth: Payer: Self-pay | Admitting: Gastroenterology

## 2023-04-26 VITALS — BP 107/50 | HR 68 | Temp 96.8°F | Resp 18

## 2023-04-26 DIAGNOSIS — I2510 Atherosclerotic heart disease of native coronary artery without angina pectoris: Secondary | ICD-10-CM | POA: Diagnosis not present

## 2023-04-26 DIAGNOSIS — D509 Iron deficiency anemia, unspecified: Secondary | ICD-10-CM | POA: Diagnosis not present

## 2023-04-26 DIAGNOSIS — Z79899 Other long term (current) drug therapy: Secondary | ICD-10-CM | POA: Diagnosis not present

## 2023-04-26 DIAGNOSIS — I5033 Acute on chronic diastolic (congestive) heart failure: Secondary | ICD-10-CM | POA: Diagnosis not present

## 2023-04-26 DIAGNOSIS — N183 Chronic kidney disease, stage 3 unspecified: Secondary | ICD-10-CM | POA: Diagnosis not present

## 2023-04-26 DIAGNOSIS — E1122 Type 2 diabetes mellitus with diabetic chronic kidney disease: Secondary | ICD-10-CM | POA: Diagnosis not present

## 2023-04-26 DIAGNOSIS — E611 Iron deficiency: Secondary | ICD-10-CM

## 2023-04-26 DIAGNOSIS — I130 Hypertensive heart and chronic kidney disease with heart failure and stage 1 through stage 4 chronic kidney disease, or unspecified chronic kidney disease: Secondary | ICD-10-CM | POA: Diagnosis not present

## 2023-04-26 DIAGNOSIS — I4819 Other persistent atrial fibrillation: Secondary | ICD-10-CM | POA: Diagnosis not present

## 2023-04-26 MED ORDER — SODIUM CHLORIDE 0.9 % IV SOLN
510.0000 mg | Freq: Once | INTRAVENOUS | Status: AC
  Administered 2023-04-26: 510 mg via INTRAVENOUS
  Filled 2023-04-26: qty 17

## 2023-04-26 MED ORDER — SODIUM CHLORIDE 0.9 % IV SOLN
Freq: Once | INTRAVENOUS | Status: AC
  Filled 2023-04-26: qty 250

## 2023-04-26 NOTE — Progress Notes (Signed)
4540: Pt reports new dx of CHF and "fluid build up on lungs and MD has adjusted my fluid pill" Per Dr. Donneta Romberg okay to proceed with [facility] as scheduled.

## 2023-04-26 NOTE — Progress Notes (Signed)
Subjective:  Patient ID: Denise Sharp, female    DOB: 14-Nov-1945,  MRN: [id number]  77 y.o. female presents to clinic with  at risk foot care with history of diabetic neuropathy and painful thick toenails that are difficult to trim. Pain interferes with ambulation. Aggravating factors include wearing enclosed shoe gear. Pain is relieved with periodic professional debridement. Patient states her neuropathy pain is increasing in her feet and she is also starting to have symptoms in her hands as well.   Patient states she has been hospitalized since our last visit. She has been diagnosed with CHF and was treated with diuretics. States her lower extremity swelling has resolved. She does still have some shortness of breath. She is now in the Heart Failure clinic.  Patient also states she is having rectal bleeding and has had to have iron infusions, but is currently not able to have a colonoscopy performed until her cardiac issue is stable. Chief Complaint  Patient presents with   Foot Pain    PT says she is in severe pain.   Diabetes    BS-193 A1C-8 PCP-04/2023    PCP is Sherlene Shams, MD.  Allergies  Allergen Reactions   Demeclocycline Hives   Erythromycin Nausea And Vomiting and Other (See Comments)    Severe irritable bowel   Flagyl [Metronidazole] Nausea And Vomiting and Other (See Comments)    Severe irritable bowel   Glucophage [Metformin Hcl] Nausea And Vomiting and Other (See Comments)    "Sick" "I won't take anything that has metformin in it"   Tetracyclines & Related Hives and Rash   Potassium Chloride Other (See Comments)    Cannot swallow potssium pills, able to tolerate liquid potassium   Tetracycline Other (See Comments)   Diovan [Valsartan] Nausea Only        Sulfa Antibiotics Other (See Comments) and Rash    As child   Xanax [Alprazolam] Other (See Comments)    Hyperactivity     Review of Systems: Negative except as noted in the HPI.   Objective:  Denise Sharp is a pleasant 77 y.o. female obese in NAD.[name]  Vascular Examination: Vascular status intact b/l with palpable pedal pulses. Diminished pedal hair. CFT immediate b/l. No edema. No pain with calf compression b/l. Skin temperature gradient WNL b/l. No cyanosis or clubbing noted b/l LE.  Neurological Examination: Pt has subjective symptoms of neuropathy. Protective sensation diminished with 10g monofilament b/l.  Dermatological Examination: Pedal skin with normal turgor, texture and tone b/l.   Toenails 1-5 b/l thick, discolored, elongated with subungual debris and pain on dorsal palpation.  No corns, calluses nor porokeratotic lesions noted.  Musculoskeletal Examination: Muscle strength 5/5 to b/l LE. Charcot deformity noted of both feet.  Radiographs: None  Last A1c:      Latest Ref Rng & Units 03/21/2023   11:22 AM 12/06/2022    1:29 PM 07/13/2022    3:29 PM  Hemoglobin A1C  Hemoglobin-A1c 4.0 - 5.6 % 7.6  7.8  7.1      Assessment:   1. Pain due to onychomycosis of toenail   2. Type II diabetes mellitus with neurological manifestations (HCC)    Plan:  -Consent given for treatment as described below: -Examined patient. -Discussed neuropathy symptoms. Patient does see Neurology and has an upcoming appointment next week. -Continue foot and shoe inspections daily. Monitor blood glucose per PCP/Endocrinologist's recommendations. -Continue supportive shoe gear daily. -Mycotic toenails 1-5 bilaterally were debrided in length and girth with  sterile nail nippers and dremel without incident. -Patient/POA to call should there be question/concern in the interim. -Per staff, patient would like to be seen by Dr. Al Corpus. Per patient request, she will be scheduled with him for upcoming visits.  Return in about 3 months (around 07/26/2023).  Freddie Breech, DPM

## 2023-04-26 NOTE — Telephone Encounter (Signed)
Patient called an left a voicemail because Medicare denying her lab work. Patient asked if we can fix it so she don't have to pay the bill. I called her back to inform her we are able to do anything she will have to call billing I gave her the number and also transfer her.

## 2023-04-29 ENCOUNTER — Ambulatory Visit: Payer: Medicare Other | Admitting: Internal Medicine

## 2023-04-29 ENCOUNTER — Other Ambulatory Visit: Payer: Self-pay

## 2023-04-29 ENCOUNTER — Telehealth: Payer: Self-pay | Admitting: Internal Medicine

## 2023-04-29 ENCOUNTER — Telehealth: Payer: Self-pay

## 2023-04-29 ENCOUNTER — Encounter: Payer: Self-pay | Admitting: Internal Medicine

## 2023-04-29 VITALS — BP 100/62 | HR 73 | Ht 64.5 in | Wt 245.8 lb

## 2023-04-29 DIAGNOSIS — E1142 Type 2 diabetes mellitus with diabetic polyneuropathy: Secondary | ICD-10-CM

## 2023-04-29 DIAGNOSIS — Z794 Long term (current) use of insulin: Secondary | ICD-10-CM

## 2023-04-29 DIAGNOSIS — I5033 Acute on chronic diastolic (congestive) heart failure: Secondary | ICD-10-CM | POA: Diagnosis not present

## 2023-04-29 DIAGNOSIS — R252 Cramp and spasm: Secondary | ICD-10-CM

## 2023-04-29 DIAGNOSIS — K8681 Exocrine pancreatic insufficiency: Secondary | ICD-10-CM | POA: Diagnosis not present

## 2023-04-29 DIAGNOSIS — Z6841 Body Mass Index (BMI) 40.0 and over, adult: Secondary | ICD-10-CM | POA: Diagnosis not present

## 2023-04-29 DIAGNOSIS — E662 Morbid (severe) obesity with alveolar hypoventilation: Secondary | ICD-10-CM

## 2023-04-29 LAB — BASIC METABOLIC PANEL
BUN: 36 mg/dL — ABNORMAL HIGH (ref 6–23)
CO2: 29 meq/L (ref 19–32)
Calcium: 9.4 mg/dL (ref 8.4–10.5)
Chloride: 97 meq/L (ref 96–112)
Creatinine, Ser: 1.66 mg/dL — ABNORMAL HIGH (ref 0.40–1.20)
GFR: 29.56 mL/min — ABNORMAL LOW (ref >60.00)
Glucose, Bld: 213 mg/dL — ABNORMAL HIGH (ref 70–99)
Potassium: 3.8 meq/L (ref 3.5–5.1)
Sodium: 139 meq/L (ref 135–145)

## 2023-04-29 LAB — MAGNESIUM: Magnesium: 1.7 mg/dL (ref 1.5–2.5)

## 2023-04-29 LAB — CK: Total CK: 62 U/L (ref 7–177)

## 2023-04-29 MED ORDER — TIZANIDINE HCL 4 MG PO TABS
4.0000 mg | ORAL_TABLET | Freq: Four times a day (QID) | ORAL | 0 refills | Status: DC | PRN
  Filled 2023-04-29: qty 30, 8d supply, fill #0

## 2023-04-29 MED ORDER — "LUER LOCK SAFETY SYRINGES 25G X 1"" 3 ML MISC": 0 refills | Status: DC

## 2023-04-29 MED ORDER — TIZANIDINE HCL 4 MG PO TABS: 4.0000 mg | ORAL_TABLET | Freq: Four times a day (QID) | ORAL | 0 refills | Status: DC | PRN

## 2023-04-29 MED ORDER — ATORVASTATIN CALCIUM 80 MG PO TABS: 80.0000 mg | ORAL_TABLET | Freq: Every day | ORAL | 1 refills | Status: DC

## 2023-04-29 MED ORDER — MAGNESIUM GLUCONATE 500 MG PO TABS: 500.0000 mg | ORAL_TABLET | Freq: Every day | ORAL | 1 refills | Status: DC

## 2023-04-29 NOTE — Assessment & Plan Note (Signed)
Secondary to body habitus and longstanding OSA.  Encouraged to avoid all sedating medications , including lunesta and diazepam , and practice incentive spirometry

## 2023-04-29 NOTE — Telephone Encounter (Signed)
Pt called to request to be seen this week. Pt stated feeling SOB in the mornings and it goes away in the afternoons. And is currently taking Torsemide 40 mg (2 tablets) daily. Pt denies having CP or dizziness.  Appt to see Clarisa Kindred FNP  05/01/23 2:30pm.

## 2023-04-29 NOTE — Telephone Encounter (Signed)
Patient just walked in and said she wanted her medications sent to [facility] on [address] in [place] and not sent to [facility].

## 2023-04-29 NOTE — Telephone Encounter (Signed)
Given her concerns for decompensated heart failure yesterday and medication adjustment, I do not think that she is stable for elective procedures at this time.   [name], MD      04/25/23  7:47 AM Note

## 2023-04-29 NOTE — Telephone Encounter (Signed)
Patient states came today and had a prescription but it was sent to wrong pharmacy. Please send  tiZANidine (ZANAFLEX) 4 MG [facility] [address]

## 2023-04-29 NOTE — Patient Instructions (Signed)
TIZANIDINE IS A MUSCLE RELAXER , I HAVE SET RX TO [facility] TO USE AS NEEDED   DO NOT TAKE MAGNESIUM UNLESS I TELL YOU TO

## 2023-04-29 NOTE — Assessment & Plan Note (Signed)
She was losing weight on 60 mg torsemide but has been slowly gaining weight on 40 mg over the last few days. .  Return in about 6 months (around 10/28/2023). Up with chf clinic

## 2023-04-29 NOTE — Telephone Encounter (Signed)
See pervious message

## 2023-04-29 NOTE — Assessment & Plan Note (Signed)
Managed with pancreatic enzymes. With improved stooling

## 2023-04-29 NOTE — Assessment & Plan Note (Signed)
She has minimal sensation on microfilament test,  And requires continued  use of diabetic shoes to prevent pressure  Ulcers.

## 2023-04-29 NOTE — Assessment & Plan Note (Addendum)
Managed by endcrinology due to worsening control.  currently using novolog qac,  treisba and farxiga  Lab Results  Component Value Date   HGBA1C 7.6 (A) 03/21/2023

## 2023-04-29 NOTE — Telephone Encounter (Signed)
Medication has been sent to correct pharmacy and pt is aware.

## 2023-04-29 NOTE — Progress Notes (Signed)
Subjective:  Patient ID: Denise Sharp, female    DOB: 1946/05/11  Age: 77 y.o. MRN: [id number]  CC: The primary encounter diagnosis was Foot cramps. Diagnoses of Exocrine pancreatic insufficiency, DM type 2 with diabetic peripheral neuropathy (HCC), Diabetic polyneuropathy associated with type 2 diabetes mellitus (HCC), Obesity hypoventilation syndrome (HCC), and Acute on chronic heart failure with preserved ejection fraction (HFpEF) (HCC) were also pertinent to this visit.   HPI Denise Sharp presents for  Chief Complaint  Patient presents with   Hospitalization Follow-up   Denise Sharp is a 77 y/o female with a history of HFpEF, CAD status post PCI to the LAD, persistent atrial fibrillation, type 2 diabetes, CKD and morbid obesity presenting today for follow up.   She was hospitalized for volume overload and dis harged on Sept 16.   She was seen by her cardiologist on OCTOBER 9 and prescribed finerenone.  Torsemide dose ws increased to 60 mg daily x 3, then 40 mg daily thereafter,  advised to sodium restrict to 2000 mg daily and weigh daily.  HAS NOT STARTED FINERENONE DUE TO COST.  WAITING TO HEAR from cardiologist   She has lost 4 lbs compared to oct 9 by our scales , by home scales she has gained 0.6 LBS SINCE resuming  40 mg torsemide dose.   On October 13 .    Home Health is coming to the home for PT, OT,  nursing and social work.   Type 2 DM:  seeing endocrinologist in [place].   BS have been running "high"in the morning fasting.  Wearing a Free [employer] . Using SSI novolog before meals 8 -12 units .  At 10 pm takes 12 units tresiba sugars have been out  of range 78% of the time . Taking Creon for pancreatic insufficiency   before all meals and snacks. Stools have become less frequent and most are semi or  solid.    GI  bleeding  during hospitalization . Presumed to be due to internal hemorrhoids but has not had colonoscopy yet because of volume overload and  chronic respiratory failure.  EGD normal  [place]  Foot cramps:  severe,  occurring more frequently since the torsemide was increased.  Lasting several hours   Outpatient Medications Prior to Visit  Medication Sig Dispense Refill   apixaban (ELIQUIS) 5 MG TABS tablet Take 1 tablet (5 mg total) by mouth 2 (two) times daily. 180 tablet 1   budesonide-formoterol (SYMBICORT) 160-4.5 MCG/ACT inhaler Inhale 2 puffs into the lungs 2 (two) times daily. 1 each 6   butalbital-acetaminophen-caffeine (FIORICET) 50-325-40 MG tablet TAKE 1 TABLET BY MOUTH EVERY 6 HOURS AS NEEDED FOR HEADACHE. 60 tablet 2   Cholecalciferol (VITAMIN D3) 250 MCG (10000 UT) capsule Take 10,000 Units by mouth daily.     clotrimazole-betamethasone (LOTRISONE) cream Apply 1 application topically 2 (two) times daily as needed (irritation).     conjugated estrogens (PREMARIN) vaginal cream Apply one pea-sized amount around the opening of the urethra daily for 2 weeks, then 3 times weekly moving forward. 30 g 4   Continuous Blood Gluc Receiver (FREESTYLE LIBRE 2 READER) DEVI Use to check sugar at least TID 1 each 1   cyanocobalamin (VITAMIN B12) 1000 MCG/ML injection Inject 1 mL (1,000 mcg total) into the muscle every 30 (thirty) days. Inject 1 ml (1000 mcg ) IM weekly x 4,  Then monthly thereafter 3 mL 1   dapagliflozin propanediol (FARXIGA) 10 MG TABS tablet Take  1 tablet (10 mg total) by mouth daily. 90 tablet 3   dicyclomine (BENTYL) 20 MG tablet Take 1 tablet (20 mg total) by mouth 3 (three) times daily before meals. 180 tablet 2   DULoxetine (CYMBALTA) 30 MG capsule Take 1 capsule (30 mg total) by mouth daily. 90 capsule 4   ezetimibe (ZETIA) 10 MG tablet Take 1 tablet (10 mg total) by mouth daily. 90 tablet 3   fexofenadine (ALLEGRA) 180 MG tablet Take 180 mg by mouth as needed for allergies or rhinitis.     fluticasone (FLONASE) 50 MCG/ACT nasal spray Place 2 sprays into both nostrils daily. 16 g 6   Glucagon (GVOKE HYPOPEN 2-PACK)  0.5 MG/0.1ML SOAJ Inject 1 auto injector subcutaneously as needed for severe hypoglycemia. May repeat x 1 after 15 minutes if needed. 0.1 mL 2   glucose blood (ONETOUCH VERIO) test strip Use to check blood sugar twice daily. 100 each 1   hydrocortisone (ANUSOL-HC) 25 MG suppository Place 1 suppository (25 mg total) rectally 2 (two) times daily. 24 suppository 5   insulin aspart (NOVOLOG) 100 UNIT/ML injection Inject 10 Units into the skin 3 (three) times daily before meals.     insulin degludec (TRESIBA FLEXTOUCH) 100 UNIT/ML FlexTouch Pen Inject 12 Units into the skin daily. 15 mL 3   Insulin Pen Needle (DROPLET PEN NEEDLES) 31G X 5 MM MISC USE ONE NEEDLE SUBCUTANEOUSLY AS DIRECTED (REMOVE AND DISCARD NEEDLE IN SHARPS CONTAINER IMMEDIATELY AFTER USE) 100 each 1   lipase/protease/amylase (CREON) 36000 UNITS CPEP capsule Take 2 capsules (72,000 Units total) by mouth 3 (three) times daily before meals AND 1 capsule (36,000 Units total) with snacks. 300 capsule 9   loperamide (IMODIUM) 2 MG capsule Take 4-6 mg by mouth 3 (three) times daily before meals.     metoprolol succinate (TOPROL-XL) 25 MG 24 hr tablet Take 0.5 tablets (12.5 mg total) by mouth daily. Take with or immediately following a meal. 45 tablet 3   nitroGLYCERIN (NITROSTAT) 0.4 MG SL tablet Place 1 tablet (0.4 mg total) under the tongue every 5 (five) minutes as needed for chest pain. 25 tablet 5   nystatin (MYCOSTATIN/NYSTOP) powder Apply 1 g topically 4 (four) times daily as needed (rash).     ondansetron (ZOFRAN-ODT) 4 MG disintegrating tablet Take 1 tablet (4 mg total) by mouth every 8 (eight) hours as needed for nausea or vomiting. 30 tablet 0   OneTouch Delica Lancets 33G MISC Used to check blood sugar two times a day. 100 each 4   OVER THE COUNTER MEDICATION Apply 1 application  topically daily as needed (pain). [place] Gel topical pain reliever     pantoprazole (PROTONIX) 40 MG tablet Take 1 tablet (40 mg total) by mouth daily. 90  tablet 3   Polyethyl Glycol-Propyl Glycol (SYSTANE OP) Place 1 drop into both eyes 4 (four) times daily as needed (dry eyes).     pregabalin (LYRICA) 50 MG capsule Take 1 capsule (50 mg total) by mouth 2 (two) times daily. 180 capsule 1   torsemide (DEMADEX) 20 MG tablet Take 2 tablets (40 mg total) by mouth daily. 60 tablet 0   torsemide (DEMADEX) 20 MG tablet Take 1 tablet (20 mg total) by mouth daily. 180 tablet 3   atorvastatin (LIPITOR) 80 MG tablet Take 1 tablet (80 mg total) by mouth daily. 90 tablet 1   magnesium gluconate (MAGONATE) 500 MG tablet Take 500 mg by mouth daily.     SYRINGE-NEEDLE, DISP, 3 ML (LUER  LOCK SAFETY SYRINGES) 25G X 1" 3 ML MISC Use to give b12 injections once monthly 12 each 0   Finerenone (KERENDIA) 10 MG TABS Take 1 tablet (10 mg total) by mouth in the morning. 90 tablet 3   albuterol (VENTOLIN HFA) 108 (90 Base) MCG/ACT inhaler Inhale 1-2 puffs into the lungs every 6 (six) hours as needed for wheezing or shortness of breath. (Patient not taking: Reported on 04/24/2023) 18 g 2   No facility-administered medications prior to visit.    Review of Systems;  Patient denies headache, fevers, malaise, unintentional weight loss, skin rash, eye pain, sinus congestion and sinus pain, sore throat, dysphagia,  hemoptysis , cough, dyspnea, wheezing, chest pain, palpitations, orthopnea, edema, abdominal pain, nausea, melena, diarrhea, constipation, flank pain, dysuria, hematuria, urinary  Frequency, nocturia, numbness, tingling, seizures,  Focal weakness, Loss of consciousness,  Tremor, insomnia, depression, anxiety, and suicidal ideation.      Objective:  BP 100/62   Pulse 73   Ht 5' 4.5" (1.638 m)   Wt 245 lb 12.8 oz (111.5 kg)   SpO2 96%   BMI 41.54 kg/m   BP Readings from Last 3 Encounters:  04/29/23 100/62  04/26/23 (!) 107/50  04/24/23 120/66    Wt Readings from Last 3 Encounters:  04/29/23 245 lb 12.8 oz (111.5 kg)  04/24/23 249 lb (112.9 kg)  04/15/23  244 lb (110.7 kg)    Physical Exam Vitals reviewed.  Constitutional:      General: She is not in acute distress.    Appearance: Normal appearance. She is normal weight. She is not ill-appearing, toxic-appearing or diaphoretic.  HENT:     Head: Normocephalic.  Eyes:     General: No scleral icterus.       Right eye: No discharge.        Left eye: No discharge.     Conjunctiva/sclera: Conjunctivae normal.  Cardiovascular:     Rate and Rhythm: Normal rate and regular rhythm.     Heart sounds: Normal heart sounds.  Pulmonary:     Effort: Pulmonary effort is normal. No respiratory distress.     Breath sounds: Normal breath sounds.  Abdominal:     General: Abdomen is protuberant.     Palpations: Abdomen is soft.  Musculoskeletal:        General: Normal range of motion.  Skin:    General: Skin is warm and dry.  Neurological:     General: No focal deficit present.     Mental Status: She is alert and oriented to person, place, and time. Mental status is at baseline.  Psychiatric:        Mood and Affect: Mood normal.        Behavior: Behavior normal.        Thought Content: Thought content normal.        Judgment: Judgment normal.    Lab Results  Component Value Date   HGBA1C 7.6 (A) 03/21/2023   HGBA1C 7.8 (A) 12/06/2022   HGBA1C 7.1 (H) 07/13/2022    Lab Results  Component Value Date   CREATININE 1.66 (H) 04/29/2023   CREATININE 1.58 (H) 04/17/2023   CREATININE 1.58 (H) 04/08/2023    Lab Results  Component Value Date   WBC 13.0 (H) 04/08/2023   HGB 14.2 04/08/2023   HCT 44.6 04/08/2023   PLT 313 04/08/2023   GLUCOSE 213 (H) 04/29/2023   CHOL 103 10/23/2022   TRIG 98 10/23/2022   HDL 38 (L) 10/23/2022   LDLDIRECT  126 (H) 07/13/2022   LDLCALC 45 10/23/2022   ALT 16 03/28/2023   AST 19 03/28/2023   NA 139 04/29/2023   K 3.8 04/29/2023   CL 97 04/29/2023   CREATININE 1.66 (H) 04/29/2023   BUN 36 (H) 04/29/2023   CO2 29 04/29/2023   TSH 2.887 09/27/2022    INR 1.3 (H) 12/14/2022   HGBA1C 7.6 (A) 03/21/2023   MICROALBUR <0.7 11/21/2021    No results found.  Assessment & Plan:  .Foot cramps -     Basic metabolic panel -     Magnesium -     CK  Exocrine pancreatic insufficiency Assessment & Plan: Managed with pancreatic enzymes. With improved stooling    DM type 2 with diabetic peripheral neuropathy [facility]) Assessment & Plan: Managed by endcrinology due to worsening control.  currently using novolog qac,  treisba and farxiga  Lab Results  Component Value Date   HGBA1C 7.6 (A) 03/21/2023      Diabetic polyneuropathy associated with type 2 diabetes mellitus (HCC) Assessment & Plan: She has minimal sensation on microfilament test,  And requires continued  use of diabetic shoes to prevent pressure  Ulcers.    Obesity hypoventilation syndrome (HCC) Assessment & Plan: Secondary to body habitus and longstanding OSA.  Encouraged to avoid all sedating medications , including lunesta and diazepam , and practice incentive spirometry    Acute on chronic heart failure with preserved ejection fraction (HFpEF) (HCC) Assessment & Plan: She was losing weight on 60 mg torsemide but has been slowly gaining weight on 40 mg over the last few days. .  Return in about 6 months (around 10/28/2023). Up with chf clinic    Other orders -     Atorvastatin Calcium; Take 1 tablet (80 mg total) by mouth daily.  Dispense: 90 tablet; Refill: 1 -     Luer Lock Safety Syringes; Use to give b12 injections once monthly  Dispense: 12 each; Refill: 0 -     Magnesium Gluconate; Take 1 tablet (500 mg total) by mouth daily.  Dispense: 90 tablet; Refill: 1 -     tiZANidine HCl; Take 1 tablet (4 mg total) by mouth every 6 (six) hours as needed for muscle spasms.  Dispense: 30 tablet; Refill: 0     I provided 305  minutes of face-to-face time during this encounter reviewing patient's last visit with me, patient's  most recent visit with cardiology,    recent  surgical and non surgical procedures, previous  labs and imaging studies, counseling on currently addressed issues,  and post visit ordering to diagnostics and therapeutics .   Follow-up: Return in about 6 months (around 10/28/2023).   Sherlene Shams, MD

## 2023-04-30 NOTE — Progress Notes (Unsigned)
No chief complaint on file.     ASSESSMENT AND PLAN  Denise Sharp is a 77 y.o. female   1.  Diabetic peripheral neuropathy 2.  Near syncope episode 3. OSA, using CPAP 4.  Chronic neck pain, headache, light sensitivity -Start Cymbalta 30 mg daily for neck pain, headache -Headaches are consistent with cervicogenic, but potentially have migraine component  -We discussed considering CGRP in the future, she is not interested in Botox, may also consider referral to pain clinic for consideration of cervical injections  -MRI cervical spine has shown mild canal stenosis at C5-6 with severe right and moderate left neuroforaminal narrowing  -MRI of brain and neck showed mild to moderate small vessel disease -We discussed PT for the neck pain, potentially neuromuscular therapy, has not been helpful, but not clear she has done neuromuscular -If Cymbalta is helpful, can increase the dose, I will see her back in 6 months   DIAGNOSTIC DATA (LABS, IMAGING, TESTING) - I reviewed patient records, labs, notes, testing and imaging myself where available.   MEDICAL HISTORY:  Denise Sharp, is a 77 year old female, accompanied by her husband Alinda Money, seen in request by her [facility] care doctor Sherlene Shams for evaluation of dizziness episode, she was patient of Dr. Anne Hahn in the past,  I reviewed and summarized the referring note. PMHX. HLD DM CAD s/p stent Urinary incontinence,  Gastric Bypass twice,  Bilateral Knee replacement    She has diabetic peripheral neuropathy, unsteady gait, chronic low back pain, limited activity, mostly sedentary, reported few episodes of lightheadedness, near passing out over the past few weeks  Few months ago, she was at [facility] fair, had breakfast, walk few isles, around 2 PM, while she was ready for her lunch, riding on her scooter, she suddenly felt lightheaded, wooziness sensation, difficulty focusing, her husband has to help her sliding down to the  chair by the table, did not loss of consciousness, after eating only small amount of meal, she felt better, glucose level was 170 range,  She reported few episode of near passing out in the past, sometimes related to low glucose,  Personally reviewed MRI of the brain March 04, 2022, mild small vessel disease, no acute abnormality MRI of cervical spine, multilevel degenerative changes, no significant canal stenosis, variable degree of foraminal narrowing Nuclear medicine myocardial perfusion study, normal wall motion, normal ejection fraction, CT imaging showed calcification in the LAD, low risk scan  She did have a history of irregular heart rate in the past,  UPDATE Jun 11 2022: She is accompanied by her husband at today's clinical visit, complains of feeling miserable all the time, moderate daily headache, standing from occipital region, spreading forward, constant moderate, sometimes to the point of light noise sensitivity, has to close her eyes, resting for a while,  Also complains of hearing loss, seen by ENT, also evaluated for her dizziness, reported no significant vestibular dysfunction  She has progressive worsening diabetic peripheral neuropathy, numbness from knee down, gait abnormality,  Recent cardiac evaluation, cardiac catheter showed no significant coronary artery disease, patent stent,  Cardiac Cath on Jun 04 2022:  Mild-moderate, non-obstructive coronary artery disease, similar to prior catheterization in 08/2020. Widely patent ostial LAD stent. Patent mid LAD stent with minimal in stent restenosis (~10% narrowing). Upper normal left and right heart filling pressures. Borderline elevated pulmonary artery pressure. Normal to mildly reduced cardiac output/index.  Update August 15, 2022 SS: Is not interested in Botox for headaches. Has seen neurosurgery  about the neck, nothing surgical, injections not discussed at the time her diabetes was under poor control. Headaches are  occipital, feel better in a dark room. PCP gives Fioricet as needed, sparingly, no more than 3 a week. Spends most of the day in bed, due to a variety of reasons of her poor health. More than 3 hours of activity makes her physically exhausted. Got iron infusion. Has gained 30 lbs since November.   Update May 01, 2023 SS:   PHYSICAL EXAM:   There were no vitals filed for this visit.   PHYSICAL EXAMNIATION:  Gen: NAD, conversant, well nourised, well groomed                      NEUROLOGICAL EXAM:  MENTAL STATUS: Speech/cognition: Deconditioned, obese elderly female, alert, oriented to history taking and casual conversation CRANIAL NERVES: CN II: Visual fields are full to confrontation. Pupils are round equal and briskly reactive to light. CN III, IV, VI: extraocular movement are normal. No ptosis. CN V: Facial sensation is intact to light touch CN VII: Face is symmetric with normal eye closure  CN VIII: Hearing is normal to causal conversation. CN IX, X: Phonation is normal. CN XI: Head turning and shoulder shrug are intact  MOTOR: no significant muscle weakness, overall deconditioned   REFLEXES: Reflexes are 2+ and symmetric   Sensory: Length dependent decreased to light touch  COORDINATION: There is no trunk or limb dysmetria noted.  GAIT/STANCE: Push-up to get up from seated position, cautious, unsteady, uses walker in the hallway  REVIEW OF SYSTEMS:  Full 14 system review of systems performed and notable only for as above  See HPI  ALLERGIES: Allergies  Allergen Reactions   Demeclocycline Hives   Erythromycin Nausea And Vomiting and Other (See Comments)    Severe irritable bowel   Flagyl [Metronidazole] Nausea And Vomiting and Other (See Comments)    Severe irritable bowel   Glucophage [Metformin Hcl] Nausea And Vomiting and Other (See Comments)    "Sick" "I won't take anything that has metformin in it"   Tetracyclines & Related Hives and Rash   Potassium  Chloride Other (See Comments)    Cannot swallow potssium pills, able to tolerate liquid potassium   Tetracycline Other (See Comments)   Diovan [Valsartan] Nausea Only        Sulfa Antibiotics Other (See Comments) and Rash    As child   Xanax [Alprazolam] Other (See Comments)    Hyperactivity     HOME MEDICATIONS: Current Outpatient Medications  Medication Sig Dispense Refill   apixaban (ELIQUIS) 5 MG TABS tablet Take 1 tablet (5 mg total) by mouth 2 (two) times daily. 180 tablet 1   atorvastatin (LIPITOR) 80 MG tablet Take 1 tablet (80 mg total) by mouth daily. 90 tablet 1   budesonide-formoterol (SYMBICORT) 160-4.5 MCG/ACT inhaler Inhale 2 puffs into the lungs 2 (two) times daily. 1 each 6   butalbital-acetaminophen-caffeine (FIORICET) 50-325-40 MG tablet TAKE 1 TABLET BY MOUTH EVERY 6 HOURS AS NEEDED FOR HEADACHE. 60 tablet 2   Cholecalciferol (VITAMIN D3) 250 MCG (10000 UT) capsule Take 10,000 Units by mouth daily.     clotrimazole-betamethasone (LOTRISONE) cream Apply 1 application topically 2 (two) times daily as needed (irritation).     conjugated estrogens (PREMARIN) vaginal cream Apply one pea-sized amount around the opening of the urethra daily for 2 weeks, then 3 times weekly moving forward. 30 g 4   Continuous Blood Gluc Receiver (FREESTYLE [place]  2 READER) DEVI Use to check sugar at least TID 1 each 1   cyanocobalamin (VITAMIN B12) 1000 MCG/ML injection Inject 1 mL (1,000 mcg total) into the muscle every 30 (thirty) days. Inject 1 ml (1000 mcg ) IM weekly x 4,  Then monthly thereafter 3 mL 1   dapagliflozin propanediol (FARXIGA) 10 MG TABS tablet Take 1 tablet (10 mg total) by mouth daily. 90 tablet 3   dicyclomine (BENTYL) 20 MG tablet Take 1 tablet (20 mg total) by mouth 3 (three) times daily before meals. 180 tablet 2   DULoxetine (CYMBALTA) 30 MG capsule Take 1 capsule (30 mg total) by mouth daily. 90 capsule 4   ezetimibe (ZETIA) 10 MG tablet Take 1 tablet (10 mg total) by  mouth daily. 90 tablet 3   fexofenadine (ALLEGRA) 180 MG tablet Take 180 mg by mouth as needed for allergies or rhinitis.     Finerenone (KERENDIA) 10 MG TABS Take 1 tablet (10 mg total) by mouth in the morning. 90 tablet 3   fluticasone (FLONASE) 50 MCG/ACT nasal spray Place 2 sprays into both nostrils daily. 16 g 6   Glucagon (GVOKE HYPOPEN 2-PACK) 0.5 MG/0.1ML SOAJ Inject 1 auto injector subcutaneously as needed for severe hypoglycemia. May repeat x 1 after 15 minutes if needed. 0.1 mL 2   glucose blood (ONETOUCH VERIO) test strip Use to check blood sugar twice daily. 100 each 1   hydrocortisone (ANUSOL-HC) 25 MG suppository Place 1 suppository (25 mg total) rectally 2 (two) times daily. 24 suppository 5   insulin aspart (NOVOLOG) 100 UNIT/ML injection Inject 10 Units into the skin 3 (three) times daily before meals.     insulin degludec (TRESIBA FLEXTOUCH) 100 UNIT/ML FlexTouch Pen Inject 12 Units into the skin daily. 15 mL 3   Insulin Pen Needle (DROPLET PEN NEEDLES) 31G X 5 MM MISC USE ONE NEEDLE SUBCUTANEOUSLY AS DIRECTED (REMOVE AND DISCARD NEEDLE IN SHARPS CONTAINER IMMEDIATELY AFTER USE) 100 each 1   lipase/protease/amylase (CREON) 36000 UNITS CPEP capsule Take 2 capsules (72,000 Units total) by mouth 3 (three) times daily before meals AND 1 capsule (36,000 Units total) with snacks. 300 capsule 9   loperamide (IMODIUM) 2 MG capsule Take 4-6 mg by mouth 3 (three) times daily before meals.     magnesium gluconate (MAGONATE) 500 MG tablet Take 1 tablet (500 mg total) by mouth daily. 90 tablet 1   metoprolol succinate (TOPROL-XL) 25 MG 24 hr tablet Take 0.5 tablets (12.5 mg total) by mouth daily. Take with or immediately following a meal. 45 tablet 3   nitroGLYCERIN (NITROSTAT) 0.4 MG SL tablet Place 1 tablet (0.4 mg total) under the tongue every 5 (five) minutes as needed for chest pain. 25 tablet 5   nystatin (MYCOSTATIN/NYSTOP) powder Apply 1 g topically 4 (four) times daily as needed (rash).      ondansetron (ZOFRAN-ODT) 4 MG disintegrating tablet Take 1 tablet (4 mg total) by mouth every 8 (eight) hours as needed for nausea or vomiting. 30 tablet 0   OneTouch Delica Lancets 33G MISC Used to check blood sugar two times a day. 100 each 4   OVER THE COUNTER MEDICATION Apply 1 application  topically daily as needed (pain). [place] Gel topical pain reliever     pantoprazole (PROTONIX) 40 MG tablet Take 1 tablet (40 mg total) by mouth daily. 90 tablet 3   Polyethyl Glycol-Propyl Glycol (SYSTANE OP) Place 1 drop into both eyes 4 (four) times daily as needed (dry eyes).  pregabalin (LYRICA) 50 MG capsule Take 1 capsule (50 mg total) by mouth 2 (two) times daily. 180 capsule 1   SYRINGE-NEEDLE, DISP, 3 ML (LUER LOCK SAFETY SYRINGES) 25G X 1" 3 ML MISC Use to give b12 injections once monthly 12 each 0   tiZANidine (ZANAFLEX) 4 MG tablet Take 1 tablet (4 mg total) by mouth every 6 (six) hours as needed for muscle spasms. 30 tablet 0   torsemide (DEMADEX) 20 MG tablet Take 2 tablets (40 mg total) by mouth daily. 60 tablet 0   torsemide (DEMADEX) 20 MG tablet Take 1 tablet (20 mg total) by mouth daily. 180 tablet 3   No current facility-administered medications for this visit.    PAST MEDICAL HISTORY: Past Medical History:  Diagnosis Date   (HFpEF) heart failure with preserved ejection fraction (HCC)    a. 12/2019 Echo: EF 65-70%, Gr2 DD. No significant valvular dzs.   Abnormality of gait 03/25/2013   Adrenal mass, left (HCC)    Anginal pain (HCC)    Anxiety    Aortic atherosclerosis (HCC)    Arthritis    Asthma    CAD (coronary artery disease) with h/o Atypical Chest Pain    a. 04/1986 Cath ([facility]): nl cors, EF 65%; b. 07/2012 Cath [facility]): Diff minor irregs; c. 07/2016 MV [facility]): "Equivocal"; d. 08/2016 Cardiac CT Ca2+ score [facility]): Ca2+ 1548; e. 05/2019 MV: No ischemia. EF 75%; f. 12/2019 PCI: LM nl, LAD 65ost (3.5x12 Resolute DES), 63m (2.75x12 Resolute DES),LCX/RCA nl; g. 08/2020 Cath:  patent LAD stents, RCA 40ost, elev RH pressures, EF >65%.   Cervical spinal stenosis 1994   due to trauma to back ([employer] accident), has intermittent paralysis and parasthesias   Cervicogenic headache 03/23/2014   CKD (chronic kidney disease), stage III (HCC)    Depression    Diverticulosis    Dizziness    a.) chronic   DJD (degenerative joint disease)    a. Chronic R shoulder pain   Dyspnea    Esophageal stenosis 09/2009   a.) transient outlet obstruction by food, cleared by EGD   Family history of adverse reaction to anesthesia    a.) daughter with (+) PONV   Gastric bypass status for obesity    GERD (gastroesophageal reflux disease)    Headache(784.0)    HLD (hyperlipidemia)    Hypertension    IBS (irritable bowel syndrome)    IDA (iron deficiency anemia)    a.) post 2 unit txfsn 2009, normal endo/colonoscopy by [facility]   Left bundle branch block (LBBB)    a.) Intermittently present - likely rate related.   NSVT (nonsustained ventricular tachycardia) (HCC) 11/15/2020   a.) Holter study 11/15/2020; 11 episodes of NSVT lasting up to 8 beats with a maximum rate of 210 bpm; 14 atrial runs lasting up to 18 beats with a rate of up to 250 bpm.  Some atrial runs felt to be NSVT.   NSVT (nonsustained ventricular tachycardia) (HCC)    a. 10/2020 [facility]: 11 runs of NSVT up to 8 beats.   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 10/2020 [facility]: 14 atrial runs up to 18 beats, max HR 250.   Restless leg syndrome    Rotator cuff arthropathy, right 08/13/2017   Syncope and collapse 03/12/2014   Type II diabetes mellitus (HCC)     PAST  SURGICAL HISTORY: Past Surgical History:  Procedure Laterality Date   APPENDECTOMY     BACK SURGERY     CARDIOVERSION N/A 10/30/2022   Procedure: CARDIOVERSION;  Surgeon: [name], MD;  Location: [facility] ORS;   Service: Cardiovascular;  Laterality: N/A;   CHOLECYSTECTOMY N/A 1976   COLONOSCOPY     CORONARY ANGIOPLASTY     CORONARY STENT INTERVENTION N/A 01/04/2020   Procedure: CORONARY STENT INTERVENTION;  Surgeon: Iran Ouch, MD;  Location: [facility] INVASIVE CV LAB;  Service: Cardiovascular;  Laterality: N/A;  LAD    DIAPHRAGMATIC HERNIA REPAIR  2015   ESOPHAGEAL DILATION     multiple   ESOPHAGOGASTRODUODENOSCOPY (EGD) WITH PROPOFOL N/A 05/11/2021   Procedure: ESOPHAGOGASTRODUODENOSCOPY (EGD) WITH PROPOFOL;  Surgeon: [name], MD;  Location: [facility] ENDOSCOPY;  Service: Endoscopy;  Laterality: N/A;   ESOPHAGOGASTRODUODENOSCOPY (EGD) WITH PROPOFOL N/A 06/13/2021   Procedure: ESOPHAGOGASTRODUODENOSCOPY (EGD) WITH PROPOFOL;  Surgeon: [name], MD;  Location: [facility] ENDOSCOPY;  Service: Endoscopy;  Laterality: N/A;   ESOPHAGOGASTRODUODENOSCOPY (EGD) WITH PROPOFOL N/A 02/28/2023   Procedure: ESOPHAGOGASTRODUODENOSCOPY (EGD) WITH PROPOFOL;  Surgeon: [name], MD;  Location[facility] ENDOSCOPY;  Service: Endoscopy;  Laterality: N/A;   EYE SURGERY     GASTRIC BYPASS  2000, 2005   Dr. Arlana Pouch IMPLANT PLACEMENT  10/2011   Jeannette How IMPLANT PLACEMENT N/A 09/18/2021   Procedure: Leane Platt IMPLANT FIRST STAGE;  Surgeon: Alfredo Martinez, MD;  Location: [facility] ORS;  Service: Urology;  Laterality: N/A;   INTERSTIM IMPLANT PLACEMENT N/A 09/18/2021   Procedure: Leane Platt IMPLANT SECOND STAGE WITH IMPEDENCE CHECK;  Surgeon: Alfredo Martinez, MD;  Location: [facility] OR[facility];  Service: Urology;  Laterality: N/A;   INTERSTIM IMPLANT REMOVAL N/A 09/18/2021   Procedure: REMOVAL OF INTERSTIM IMPLANT;  Surgeon: Alfredo Martinez, MD;  Location: [facility];  Service: Urology;  Laterality: N/A;   LEFT HEART CATH AND CORONARY ANGIOGRAPHY N/A 04/1986   LEFT HEART CATH AND CORONARY ANGIOGRAPHY Left 07/24/2012   LEFT HEART CATH AND CORS/GRAFTS ANGIOGRAPHY N/A 12/31/2019   Procedure: LEFT HEART CATH AND  CORS/GRAFTS ANGIOGRAPHY;  Surgeon: Antonieta Iba, MD;  Location: [facility];  Service: Cardiovascular;  Laterality: N/A;   PANNICULECTOMY N/A 06/16/2019   Procedure: PANNICULECTOMY;  Surgeon: Allena Napoleon, MD;  Location: [facility] OR;  Service: Plastics;  Laterality: N/A;  3 hours, please   REVERSE SHOULDER ARTHROPLASTY Right 08/13/2017   Procedure: REVERSE RIGHT SHOULDER ARTHROPLASTY;  Surgeon: Teryl Lucy, MD;  Location: [facility] OR;  Service: Orthopedics;  Laterality: Right;   RIGHT/LEFT HEART CATH AND CORONARY ANGIOGRAPHY N/A 09/06/2020   Procedure: RIGHT/LEFT HEART CATH AND CORONARY ANGIOGRAPHY;  Surgeon: [name], MD;  Location: [facility];  Service: Cardiovascular;  Laterality: N/A;   RIGHT/LEFT HEART CATH AND CORONARY ANGIOGRAPHY N/A 06/04/2022   Procedure: RIGHT/LEFT HEART CATH AND CORONARY ANGIOGRAPHY;  Surgeon: [name], MD;  Location: [facility];  Service: Cardiovascular;  Laterality: N/A;   ROTATOR CUFF REPAIR Right    SPINE SURGERY  1995   [place]   TOOTH EXTRACTION  11/13/2021   TOTAL ABDOMINAL HYSTERECTOMY W/ BILATERAL SALPINGOOPHORECTOMY  1974   TOTAL KNEE ARTHROPLASTY Bilateral 2007   Procedure: TOTAL KNEE ARTHROPLASTY; Surgeons: Gerrit Heck, MD and [name], MD   UMBILICAL HERNIA REPAIR  08/11/2015    FAMILY HISTORY: Family History  Problem Relation Age of Onset   Heart disease Father    Hypertension Father    Prostate cancer Father    Stroke Father  Osteoporosis Father    Stroke Mother    Depression Mother    Headache Mother    Heart disease Mother    Thyroid disease Mother    Hypertension Mother    Diabetes Daughter    Heart disease Daughter    Hypertension Daughter    Hypertension Son     SOCIAL HISTORY: Social History   Socioeconomic History   Marital status: Married    Spouse name: Alinda Money    Number of children: 2   Years of education: College    Highest education level: Not on file  Occupational History     Employer: retired  Tobacco Use   Smoking status: Never    Passive exposure: Never   Smokeless tobacco: Never  Vaping Use   Vaping status: Never Used  Substance and Sexual Activity   Alcohol use: No   Drug use: Not Currently    Comment: prescribed valium   Sexual activity: Not Currently    Birth control/protection: Post-menopausal, Surgical  Other Topics Concern   Not on file  Social History Narrative   Patient lives at home with husband Alinda Money.    Right Handed   Drinks caffeinated tea occasionally   One pet in house, dog   Social Determinants of Health   Financial Resource Strain: Low Risk  (09/15/2021)   Overall Financial Resource Strain (CARDIA)    Difficulty of Paying Living Expenses: Not hard at all  Food Insecurity: No Food Insecurity (04/05/2023)   Hunger Vital Sign    Worried About Running Out of Food in the Last Year: Never true    Ran Out of Food in the Last Year: Never true  Transportation Needs: No Transportation Needs (04/05/2023)   PRAPARE - Administrator, Civil Service (Medical): No    Lack of Transportation (Non-Medical): No  Physical Activity: Insufficiently Active (11/15/2020)   Exercise Vital Sign    Days of Exercise per Week: 2 days    Minutes of Exercise per Session: 40 min  Stress: Stress Concern Present (08/03/2020)   [employer] of Occupational Health - Occupational Stress Questionnaire    Feeling of Stress : Rather much  Social Connections: Not on file  Intimate Partner Violence: Not At Risk (03/28/2023)   Humiliation, Afraid, Rape, and Kick questionnaire    Fear of Current or Ex-Partner: No    Emotionally Abused: No    Physically Abused: No    Sexually Abused: No    Margie Ege, Edrick Oh, DNP  [facility] [address], [address] [place], [place] [address] [phone]

## 2023-05-01 ENCOUNTER — Other Ambulatory Visit (HOSPITAL_COMMUNITY): Payer: Self-pay

## 2023-05-01 ENCOUNTER — Ambulatory Visit (INDEPENDENT_AMBULATORY_CARE_PROVIDER_SITE_OTHER): Payer: Medicare Other | Admitting: Neurology

## 2023-05-01 ENCOUNTER — Encounter: Payer: Self-pay | Admitting: Neurology

## 2023-05-01 ENCOUNTER — Ambulatory Visit: Payer: Medicare Other | Attending: Cardiology | Admitting: Cardiology

## 2023-05-01 ENCOUNTER — Telehealth: Payer: Self-pay | Admitting: Pharmacist

## 2023-05-01 VITALS — BP 130/52 | HR 72 | Wt 249.0 lb

## 2023-05-01 VITALS — BP 123/75 | HR 66 | Resp 15 | Ht 64.5 in | Wt 245.0 lb

## 2023-05-01 DIAGNOSIS — E1122 Type 2 diabetes mellitus with diabetic chronic kidney disease: Secondary | ICD-10-CM | POA: Insufficient documentation

## 2023-05-01 DIAGNOSIS — G8929 Other chronic pain: Secondary | ICD-10-CM | POA: Diagnosis not present

## 2023-05-01 DIAGNOSIS — I480 Paroxysmal atrial fibrillation: Secondary | ICD-10-CM | POA: Insufficient documentation

## 2023-05-01 DIAGNOSIS — R519 Headache, unspecified: Secondary | ICD-10-CM

## 2023-05-01 DIAGNOSIS — E669 Obesity, unspecified: Secondary | ICD-10-CM | POA: Diagnosis not present

## 2023-05-01 DIAGNOSIS — R269 Unspecified abnormalities of gait and mobility: Secondary | ICD-10-CM | POA: Diagnosis not present

## 2023-05-01 DIAGNOSIS — Z631 Problems in relationship with in-laws: Secondary | ICD-10-CM | POA: Diagnosis not present

## 2023-05-01 DIAGNOSIS — I130 Hypertensive heart and chronic kidney disease with heart failure and stage 1 through stage 4 chronic kidney disease, or unspecified chronic kidney disease: Secondary | ICD-10-CM | POA: Insufficient documentation

## 2023-05-01 DIAGNOSIS — N1832 Chronic kidney disease, stage 3b: Secondary | ICD-10-CM | POA: Insufficient documentation

## 2023-05-01 DIAGNOSIS — D509 Iron deficiency anemia, unspecified: Secondary | ICD-10-CM | POA: Diagnosis not present

## 2023-05-01 DIAGNOSIS — I679 Cerebrovascular disease, unspecified: Secondary | ICD-10-CM | POA: Diagnosis not present

## 2023-05-01 DIAGNOSIS — E1142 Type 2 diabetes mellitus with diabetic polyneuropathy: Secondary | ICD-10-CM | POA: Diagnosis not present

## 2023-05-01 DIAGNOSIS — M542 Cervicalgia: Secondary | ICD-10-CM

## 2023-05-01 DIAGNOSIS — R5381 Other malaise: Secondary | ICD-10-CM | POA: Insufficient documentation

## 2023-05-01 DIAGNOSIS — I5032 Chronic diastolic (congestive) heart failure: Secondary | ICD-10-CM | POA: Diagnosis not present

## 2023-05-01 DIAGNOSIS — G4733 Obstructive sleep apnea (adult) (pediatric): Secondary | ICD-10-CM | POA: Diagnosis not present

## 2023-05-01 DIAGNOSIS — I2510 Atherosclerotic heart disease of native coronary artery without angina pectoris: Secondary | ICD-10-CM | POA: Insufficient documentation

## 2023-05-01 DIAGNOSIS — Z955 Presence of coronary angioplasty implant and graft: Secondary | ICD-10-CM | POA: Insufficient documentation

## 2023-05-01 DIAGNOSIS — K625 Hemorrhage of anus and rectum: Secondary | ICD-10-CM | POA: Diagnosis not present

## 2023-05-01 MED ORDER — DULOXETINE HCL 60 MG PO CPEP: 60.0000 mg | ORAL_CAPSULE | Freq: Every day | ORAL | 3 refills | Status: DC

## 2023-05-01 MED ORDER — TORSEMIDE 20 MG PO TABS: ORAL_TABLET | ORAL | 0 refills | Status: DC

## 2023-05-01 NOTE — Progress Notes (Signed)
Advanced Heart Failure Clinic Note   Referring Physician: recent admit PCP: Sherlene Shams, MD (last seen 06/24) Cardiologist: [name], MD (last seen 09/24)  HPI:  Denise Sharp is a 77 y/o female with a history of HFpEF, CAD status post PCI to the LAD, persistent atrial fibrillation, type 2 diabetes, CKD and morbid obesity presenting today for follow up.  Admitted 03/28/23 due to 30 pound weight gain requiring IV diuresis.  At that time she was taking Lasix 20 mg once daily to once every other day.  She was admitted to [facility] where echocardiogram demonstrated preserved EF with grade 2 diastolic dysfunction.  She was diuresed 4 L through admission and transition to torsemide 20 mg daily at discharge.  Throughout admission she maintained normal sinus rhythm.  She did have rectal bleeding that was secondary to hemorrhoids.  Echo 03/25/12: EF 55-65% with mild AR Echo 08/21/17: EF 65-70% with Grade II DD and mildly elevated PA pressure of 33 mmHg Echo 12/30/19: EF 65-70% with Grade II DD Echo 03/29/23: EF 60-65% with mild LVH, Grade II DD  RHC/ LHC 06/04/22: Mild-moderate, non-obstructive coronary artery disease, similar to prior catheterization in 08/2020. Widely patent ostial LAD stent. Patent mid LAD stent with minimal in stent restenosis (~10% narrowing). Upper normal left and right heart filling pressures. Borderline elevated pulmonary artery pressure. Normal to mildly reduced cardiac output/index.  Patient returns today for followup of CHF.  After last appointment, she was told to increase torsemide to 60 mg daily.  She lost weight and felt better.  However, PCP checked creatinine recently and found it to be higher at 1.66. She was told to decrease torsemide back to 40 mg daily. Since then, she has gained weight and become more short of breath.  She reports dyspnea walking around her house with her walker.  She has orthopnea.  She has constant low level chest heaviness.  Overall, energy  level is poor.   REDS clip 28%  Labs (9/24): BNP 23 Labs (10/24): K 3.8, creatinine 1.66  Past Medical History:  Diagnosis Date   (HFpEF) heart failure with preserved ejection fraction (HCC)    a. 12/2019 Echo: EF 65-70%, Gr2 DD. No significant valvular dzs.   Abnormality of gait 03/25/2013   Adrenal mass, left (HCC)    Anginal pain (HCC)    Anxiety    Aortic atherosclerosis (HCC)    Arthritis    Asthma    CAD (coronary artery disease) with h/o Atypical Chest Pain    a. 04/1986 Cath ([facility]): nl cors, EF 65%; b. 07/2012 Cath [facility]): Diff minor irregs; c. 07/2016 [facility]): "Equivocal"; d. 08/2016 Cardiac CT Ca2+ score [facility]): Ca2+ 1548; e. 05/2019 MV: No ischemia. EF 75%; f. 12/2019 PCI: LM nl, LAD 65ost (3.5x12 Resolute DES), 3m (2.75x12 Resolute DES),LCX/RCA nl; g. 08/2020 Cath: patent LAD stents, RCA 40ost, elev RH pressures, EF >65%.   Cervical spinal stenosis 1994   due to trauma to back ([employer] accident), has intermittent paralysis and parasthesias   Cervicogenic headache 03/23/2014   CKD (chronic kidney disease), stage III (HCC)    Depression    Diverticulosis    Dizziness    a.) chronic   DJD (degenerative joint disease)    a. Chronic R shoulder pain   Dyspnea    Esophageal stenosis 09/2009   a.) transient outlet obstruction by food, cleared by EGD   Family history of adverse reaction to anesthesia    a.) daughter with (+) PONV   Gastric bypass  status for obesity    GERD (gastroesophageal reflux disease)    Headache(784.0)    HLD (hyperlipidemia)    Hypertension    IBS (irritable bowel syndrome)    IDA (iron deficiency anemia)    a.) post 2 unit txfsn 2009, normal endo/colonoscopy by [facility]   Left bundle branch block (LBBB)    a.) Intermittently present - likely rate related.   NSVT (nonsustained ventricular tachycardia) (HCC) 11/15/2020   a.) Holter study 11/15/2020; 11 episodes of NSVT lasting up to 8 beats with a maximum rate of 210 bpm; 14 atrial runs lasting up to 18  beats with a rate of up to 250 bpm.  Some atrial runs felt to be NSVT.   NSVT (nonsustained ventricular tachycardia) (HCC)    a. 10/2020 [facility]: 11 runs of NSVT up to 8 beats.   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 10/2020 [facility]: 14 atrial runs up to 18 beats, max HR 250.   Restless leg syndrome    Rotator cuff arthropathy, right 08/13/2017   Syncope and collapse 03/12/2014   Type II diabetes mellitus (HCC)     Current Outpatient Medications  Medication Sig Dispense Refill   apixaban (ELIQUIS) 5 MG TABS tablet Take 1 tablet (5 mg total) by mouth 2 (two) times daily. 180 tablet 1   atorvastatin (LIPITOR) 80 MG tablet Take 1 tablet (80 mg total) by mouth daily. 90 tablet 1   budesonide-formoterol (SYMBICORT) 160-4.5 MCG/ACT inhaler Inhale 2 puffs into the lungs 2 (two) times daily. 1 each 6   butalbital-acetaminophen-caffeine (FIORICET) 50-325-40 MG tablet TAKE 1 TABLET BY MOUTH EVERY 6 HOURS AS NEEDED FOR HEADACHE. 60 tablet 2   Cholecalciferol (VITAMIN D3) 250 MCG (10000 UT) capsule Take 10,000 Units by mouth daily.     clotrimazole-betamethasone (LOTRISONE) cream Apply 1 application topically 2 (two) times daily as needed (irritation).     conjugated estrogens (PREMARIN) vaginal cream Apply one pea-sized amount around the opening of the urethra daily for 2 weeks, then 3 times weekly moving forward. 30 g 4   Continuous Blood Gluc Receiver (FREESTYLE LIBRE 2 READER) DEVI Use to check sugar at least TID 1 each 1   cyanocobalamin (VITAMIN B12) 1000 MCG/ML injection Inject 1 mL (1,000 mcg total) into the muscle every 30 (thirty) days. Inject 1 ml (1000 mcg ) IM weekly x 4,  Then monthly thereafter 3 mL 1   dapagliflozin propanediol (FARXIGA) 10 MG TABS tablet Take 1 tablet (10 mg total) by mouth daily. 90 tablet 3   dicyclomine (BENTYL) 20 MG  tablet Take 1 tablet (20 mg total) by mouth 3 (three) times daily before meals. 180 tablet 2   DULoxetine (CYMBALTA) 60 MG capsule Take 1 capsule (60 mg total) by mouth daily. 90 capsule 3   ezetimibe (ZETIA) 10 MG tablet Take 1 tablet (10 mg total) by mouth daily. 90 tablet 3   fexofenadine (ALLEGRA) 180 MG tablet Take 180 mg by mouth as needed for allergies or rhinitis.     Finerenone (KERENDIA) 10 MG TABS Take 1 tablet (10 mg total) by mouth in the morning. 90 tablet 3   fluticasone (FLONASE) 50 MCG/ACT nasal spray Place 2 sprays into both nostrils daily. 16 g 6   Glucagon (GVOKE HYPOPEN 2-PACK) 0.5 MG/0.1ML SOAJ Inject 1 auto injector subcutaneously as needed for severe  hypoglycemia. May repeat x 1 after 15 minutes if needed. 0.1 mL 2   glucose blood (ONETOUCH VERIO) test strip Use to check blood sugar twice daily. 100 each 1   hydrocortisone (ANUSOL-HC) 25 MG suppository Place 1 suppository (25 mg total) rectally 2 (two) times daily. 24 suppository 5   insulin aspart (NOVOLOG) 100 UNIT/ML injection Inject 10 Units into the skin 3 (three) times daily before meals.     insulin degludec (TRESIBA FLEXTOUCH) 100 UNIT/ML FlexTouch Pen Inject 12 Units into the skin daily. 15 mL 3   Insulin Pen Needle (DROPLET PEN NEEDLES) 31G X 5 MM MISC USE ONE NEEDLE SUBCUTANEOUSLY AS DIRECTED (REMOVE AND DISCARD NEEDLE IN SHARPS CONTAINER IMMEDIATELY AFTER USE) 100 each 1   lipase/protease/amylase (CREON) 36000 UNITS CPEP capsule Take 2 capsules (72,000 Units total) by mouth 3 (three) times daily before meals AND 1 capsule (36,000 Units total) with snacks. 300 capsule 9   loperamide (IMODIUM) 2 MG capsule Take 4-6 mg by mouth 3 (three) times daily before meals.     magnesium gluconate (MAGONATE) 500 MG tablet Take 1 tablet (500 mg total) by mouth daily. 90 tablet 1   metoprolol succinate (TOPROL-XL) 25 MG 24 hr tablet Take 0.5 tablets (12.5 mg total) by mouth daily. Take with or immediately following a meal. 45  tablet 3   nitroGLYCERIN (NITROSTAT) 0.4 MG SL tablet Place 1 tablet (0.4 mg total) under the tongue every 5 (five) minutes as needed for chest pain. 25 tablet 5   nystatin (MYCOSTATIN/NYSTOP) powder Apply 1 g topically 4 (four) times daily as needed (rash).     ondansetron (ZOFRAN-ODT) 4 MG disintegrating tablet Take 1 tablet (4 mg total) by mouth every 8 (eight) hours as needed for nausea or vomiting. 30 tablet 0   OneTouch Delica Lancets 33G MISC Used to check blood sugar two times a day. 100 each 4   OVER THE COUNTER MEDICATION Apply 1 application  topically daily as needed (pain). [place] Gel topical pain reliever     pantoprazole (PROTONIX) 40 MG tablet Take 1 tablet (40 mg total) by mouth daily. 90 tablet 3   Polyethyl Glycol-Propyl Glycol (SYSTANE OP) Place 1 drop into both eyes 4 (four) times daily as needed (dry eyes).     pregabalin (LYRICA) 50 MG capsule Take 1 capsule (50 mg total) by mouth 2 (two) times daily. 180 capsule 1   SYRINGE-NEEDLE, DISP, 3 ML (LUER LOCK SAFETY SYRINGES) 25G X 1" 3 ML MISC Use to give b12 injections once monthly 12 each 0   tiZANidine (ZANAFLEX) 4 MG tablet Take 1 tablet (4 mg total) by mouth every 6 (six) hours as needed for muscle spasms. 30 tablet 0   torsemide (DEMADEX) 20 MG tablet Take 40mg  every other day alternating with 60mg  every other day. 60 tablet 0   No current facility-administered medications for this visit.    Allergies  Allergen Reactions   Demeclocycline Hives   Erythromycin Nausea And Vomiting and Other (See Comments)    Severe irritable bowel   Flagyl [Metronidazole] Nausea And Vomiting and Other (See Comments)    Severe irritable bowel   Glucophage [Metformin Hcl] Nausea And Vomiting and Other (See Comments)    "Sick" "I won't take anything that has metformin in it"   Tetracyclines & Related Hives and Rash   Potassium Chloride Other (See Comments)    Cannot swallow potssium pills, able to tolerate liquid potassium   Tetracycline  Other (See Comments)   Diovan [Valsartan]  Nausea Only        Sulfa Antibiotics Other (See Comments) and Rash    As child   Xanax [Alprazolam] Other (See Comments)    Hyperactivity       Social History   Socioeconomic History   Marital status: Married    Spouse name: Alinda Money    Number of children: 2   Years of education: College    Highest education level: Not on file  Occupational History    Employer: retired  Tobacco Use   Smoking status: Never    Passive exposure: Never   Smokeless tobacco: Never  Vaping Use   Vaping status: Never Used  Substance and Sexual Activity   Alcohol use: No   Drug use: Not Currently    Comment: prescribed valium   Sexual activity: Not Currently    Birth control/protection: Post-menopausal, Surgical  Other Topics Concern   Not on file  Social History Narrative   Patient lives at home with husband Alinda Money.    Right Handed   Drinks caffeinated tea occasionally   One pet in house, dog   Social Determinants of Health   Financial Resource Strain: Low Risk  (09/15/2021)   Overall Financial Resource Strain (CARDIA)    Difficulty of Paying Living Expenses: Not hard at all  Food Insecurity: No Food Insecurity (04/05/2023)   Hunger Vital Sign    Worried About Running Out of Food in the Last Year: Never true    Ran Out of Food in the Last Year: Never true  Transportation Needs: No Transportation Needs (04/05/2023)   PRAPARE - Administrator, Civil Service (Medical): No    Lack of Transportation (Non-Medical): No  Physical Activity: Insufficiently Active (11/15/2020)   Exercise Vital Sign    Days of Exercise per Week: 2 days    Minutes of Exercise per Session: 40 min  Stress: Stress Concern Present (08/03/2020)   [employer] of Occupational Health - Occupational Stress Questionnaire    Feeling of Stress : Rather much  Social Connections: Not on file  Intimate Partner Violence: Not At Risk (03/28/2023)   Humiliation, Afraid, Rape, and  Kick questionnaire    Fear of Current or Ex-Partner: No    Emotionally Abused: No    Physically Abused: No    Sexually Abused: No      Family History  Problem Relation Age of Onset   Heart disease Father    Hypertension Father    Prostate cancer Father    Stroke Father    Osteoporosis Father    Stroke Mother    Depression Mother    Headache Mother    Heart disease Mother    Thyroid disease Mother    Hypertension Mother    Diabetes Daughter    Heart disease Daughter    Hypertension Daughter    Hypertension Son       PHYSICAL EXAM: Vitals:   05/01/23 1453  BP: (!) 130/52  Pulse: 72  SpO2: 96%   General: NAD Neck: Thick, JVP difficult, no thyromegaly or thyroid nodule.  Lungs: Clear to auscultation bilaterally with normal respiratory effort. CV: Nondisplaced PMI.  Heart regular S1/S2, no S3/S4, no murmur.  Trace ankle edema.  No carotid bruit.  Normal pedal pulses.  Abdomen: Soft, nontender, no hepatosplenomegaly, no distention.  Skin: Intact without lesions or rashes.  Neurologic: Alert and oriented x 3.  Psych: Normal affect. Extremities: No clubbing or cyanosis.  HEENT: Normal.    ECG (personally reviewed): NSR, normal  ASSESSMENT & PLAN:  1: Chronic heart failure with preserved ejection fraction- - suspect due to obesity, atrial fibrillation, vascular disease, hypertension, CKD; some of her symptoms are now secondary to deconditioning. - NYHA class III - Hypervolemic on exam; reports several lb weight gain. Will plan on  - Echo 03/25/12: EF 55-65% with mild AR - Echo 08/21/17: EF 65-70% with Grade II DD and mildly elevated PA pressure of 33 mmHg - Echo 12/30/19: EF 65-70% with Grade II DD - Echo 03/29/23: EF 60-65% with mild LVH, Grade II DD - Patient is symptomatically worse today, NYHA class III.  Weight is up at home (though stable on our scale).  Exam is difficult for volume. She recently decreased torsemide to 40 mg daily due to rise in creatinine to 1.66.   - I will have her increase torsemide to 60 daily alternating with 40 daily.  BMET/BNP in 10 days.   - Continue dapagliflozin - Still needs to start finerenone 10 mg daily.  - I will have her wear the [employer]oll Heart Failure Management System device for the next several months for better monitoring volume status given difficult exam.    2: HTN- - well controlled today - BMP in 10 days.   3: PAF - NSR today - Continue toprol 25mg  daily - Continue apixaban.   4: OSA- - saw pulmonology Craige Cotta) 08/24  5: Rectal bleeding- - saw GI Gerre Pebbles) 08/24.  - Reports that she has continued intermittent bleeding; she is planning to undergo colonoscopy. From a HF standpoint she is stable to proceed.   6: Iron deficiency anemia- - saw hematology Donneta Romberg) 09/24 - hemoglobin 04/01/23 was 12.6  7: DM- - saw endocrinology Lurlean Leyden) 09/24 - A1c 03/21/23 was 7.6%  8. CKD IIIB - With underlying T2DM, HFpEF will start finerenone 10mg .  - repeat labs in 10 days (BMP/BNP)  Followup in 3 wks in HF clinic.   Marca Ancona, MD 05/01/23

## 2023-05-01 NOTE — Patient Instructions (Addendum)
You can increase Cymbalta to 60 mg daily, see if this helps with headache, neuropathy pain, may help with legs cramps. When you have PT, can see if they can work with your neck offer any neuromuscular therapy.   Meds ordered this encounter  Medications   DULoxetine (CYMBALTA) 60 MG capsule    Sig: Take 1 capsule (60 mg total) by mouth daily.    Dispense:  90 capsule    Refill:  3

## 2023-05-01 NOTE — Telephone Encounter (Signed)
Spoke with [facility], finerenone had been received and is scheduled to be sent this week. Relayed expected timeline to the patient.

## 2023-05-01 NOTE — Patient Instructions (Signed)
Take 40mg  every other day alternating with 60mg  every other day.  Labs in 10 days   Follow up in 3 weeks  Do the following things EVERYDAY: Weigh yourself in the morning before breakfast. Write it down and keep it in a log. Take your medicines as prescribed Eat low salt foods--Limit salt (sodium) to 2000 mg per day.  Stay as active as you can everyday Limit all fluids for the day to less than 2 liters

## 2023-05-02 ENCOUNTER — Ambulatory Visit: Payer: Self-pay

## 2023-05-02 DIAGNOSIS — E785 Hyperlipidemia, unspecified: Secondary | ICD-10-CM | POA: Diagnosis not present

## 2023-05-02 DIAGNOSIS — N3281 Overactive bladder: Secondary | ICD-10-CM | POA: Diagnosis not present

## 2023-05-02 DIAGNOSIS — N1832 Chronic kidney disease, stage 3b: Secondary | ICD-10-CM | POA: Diagnosis not present

## 2023-05-02 DIAGNOSIS — R809 Proteinuria, unspecified: Secondary | ICD-10-CM | POA: Diagnosis not present

## 2023-05-02 DIAGNOSIS — R609 Edema, unspecified: Secondary | ICD-10-CM | POA: Diagnosis not present

## 2023-05-02 DIAGNOSIS — I509 Heart failure, unspecified: Secondary | ICD-10-CM | POA: Diagnosis not present

## 2023-05-02 DIAGNOSIS — G4733 Obstructive sleep apnea (adult) (pediatric): Secondary | ICD-10-CM | POA: Diagnosis not present

## 2023-05-02 DIAGNOSIS — E1129 Type 2 diabetes mellitus with other diabetic kidney complication: Secondary | ICD-10-CM | POA: Diagnosis not present

## 2023-05-02 DIAGNOSIS — Z9884 Bariatric surgery status: Secondary | ICD-10-CM | POA: Diagnosis not present

## 2023-05-02 DIAGNOSIS — D508 Other iron deficiency anemias: Secondary | ICD-10-CM | POA: Diagnosis not present

## 2023-05-02 DIAGNOSIS — D649 Anemia, unspecified: Secondary | ICD-10-CM | POA: Diagnosis not present

## 2023-05-02 DIAGNOSIS — E1122 Type 2 diabetes mellitus with diabetic chronic kidney disease: Secondary | ICD-10-CM | POA: Diagnosis not present

## 2023-05-02 NOTE — Patient Instructions (Signed)
Visit Information  Thank you for taking time to visit with me today. Please don't hesitate to contact me if I can be of assistance to you.   Following are the goals we discussed today:   Goals Addressed             This Visit's Progress    Post hospital follow up/ management of health conditions.       Interventions Today    Flowsheet Row Most Recent Value  Chronic Disease   Chronic disease during today's visit Congestive Heart Failure (CHF), Other  [rectal bleeding]  General Interventions   General Interventions Discussed/Reviewed General Interventions Reviewed, Doctor Visits, Labs  [evaluation of current treatment plan for HF/ rectal bleeding and patients adherence to plan as established by the provider. Assessed for HF / rectal bleeding symptoms. Discussed ongoing Home health services.]  Labs Kidney Function  [discussed recent kidney function lab result and date for repeat labs.]  Doctor Visits Discussed/Reviewed Doctor Visits Reviewed  [reviewed/ discussed upcoming provider visits. Discussed compliance with provider visits.]  Education Interventions   Education Provided Provided Education  [heart failure symptoms and action plan discussed. Confirmed patient aware when to call provider for symptoms.]  Pharmacy Interventions   Pharmacy Dicussed/Reviewed Pharmacy Topics Reviewed  [Discussed multiple medication adjustments.  Confirmed patient understanding medication adjustments and when to call provider for concerns.]              Our next appointment is by telephone on 05/24/23 at 1:30 pm  Please call the care guide team at [phone] if you need to cancel or reschedule your appointment.   If you are experiencing a Mental Health or Behavioral Health Crisis or need someone to talk to, please call the Suicide and Crisis Lifeline: 988 call 1-800-273-TALK (toll free, 24 hour hotline)  Patient verbalizes understanding of instructions and care plan provided today and agrees to  view in MyChart. Active MyChart status and patient understanding of how to access instructions and care plan via MyChart confirmed with patient.     George Ina RN,BSN,CCM [facility] Care Coordination [phone] direct line

## 2023-05-02 NOTE — Telephone Encounter (Signed)
Patient called in about her lab work, stool test, pancreatic test and etc. She stated when she called billing they advised her that she needs to call our office and the nurse or PA will have to speak to Medicare.

## 2023-05-02 NOTE — Patient Outreach (Signed)
Care Coordination   Follow Up Visit Note   05/02/2023 Name: Denise Sharp MRN: [id number] DOB: 01/10/1946  Denise Sharp is a 77 y.o. year old female who sees Darrick Huntsman, Mar Daring, MD for primary care. I spoke with  Denise Sharp by phone today.  What matters to the patients health and wellness today?  Patient states she lost 2 lbs overnight on current fluid medication treatment.  She states her shortness of breath has gotten somewhat better.  Patient reports having follow up visits on yesterday with cardiologist and heart failure clinic as well as having follow up with primary care provider on 04/29/23.  Patient states her recent medication adjustment by cardiologist is to take torsemide 60 mg one day alternating days with torsemide 40 mg. Patient states this is being done because her  creatinine level was elevated. Patient states she is scheduled to have repeat labs today and meets with her nephrologist next week. Patient states she is unable to have the colonoscopy done regarding the rectal bleeding because she is carrying to much fluid on her lungs.  She states her heart doctor did not give her clearance for colonoscopy due to the fluid.  Patient states she will wait and have further conversation with the nephrologist regarding her kidney function.  Patient denies having any rectal bleeding at this time. She voiced frustration that she has not had home health PT/ OT or nursing  services this week due to the inability to coordinate her schedule with therapist/ nursing availability.     Goals Addressed             This Visit's Progress    Post hospital follow up/ management of health conditions.       Interventions Today    Flowsheet Row Most Recent Value  Chronic Disease   Chronic disease during today's visit Congestive Heart Failure (CHF), Other  [rectal bleeding]  General Interventions   General Interventions Discussed/Reviewed General Interventions Reviewed, Doctor Visits, Labs   [evaluation of current treatment plan for HF/ rectal bleeding and patients adherence to plan as established by the provider. Assessed for HF / rectal bleeding symptoms. Discussed ongoing Home health services.]  Labs Kidney Function  [discussed recent kidney function lab result and date for repeat labs.]  Doctor Visits Discussed/Reviewed Doctor Visits Reviewed  [reviewed/ discussed upcoming provider visits. Discussed compliance with provider visits.]  Education Interventions   Education Provided Provided Education  [heart failure symptoms and action plan discussed. Confirmed patient aware when to call provider for symptoms.]  Pharmacy Interventions   Pharmacy Dicussed/Reviewed Pharmacy Topics Reviewed  [Discussed multiple medication adjustments.  Confirmed patient understanding medication adjustments and when to call provider for concerns.]              SDOH assessments and interventions completed:  No     Care Coordination Interventions:  Yes, provided   Follow up plan: Follow up call scheduled for 05/24/23    Encounter Outcome:  Patient Visit Completed   George Ina RN,BSN,CCM [facility] Care Coordination [phone] direct line

## 2023-05-03 ENCOUNTER — Encounter (HOSPITAL_COMMUNITY): Payer: Self-pay

## 2023-05-03 DIAGNOSIS — I2510 Atherosclerotic heart disease of native coronary artery without angina pectoris: Secondary | ICD-10-CM | POA: Diagnosis not present

## 2023-05-03 DIAGNOSIS — I130 Hypertensive heart and chronic kidney disease with heart failure and stage 1 through stage 4 chronic kidney disease, or unspecified chronic kidney disease: Secondary | ICD-10-CM | POA: Diagnosis not present

## 2023-05-03 DIAGNOSIS — I4819 Other persistent atrial fibrillation: Secondary | ICD-10-CM | POA: Diagnosis not present

## 2023-05-03 DIAGNOSIS — N183 Chronic kidney disease, stage 3 unspecified: Secondary | ICD-10-CM | POA: Diagnosis not present

## 2023-05-03 DIAGNOSIS — E1122 Type 2 diabetes mellitus with diabetic chronic kidney disease: Secondary | ICD-10-CM | POA: Diagnosis not present

## 2023-05-03 DIAGNOSIS — I5033 Acute on chronic diastolic (congestive) heart failure: Secondary | ICD-10-CM | POA: Diagnosis not present

## 2023-05-03 NOTE — Telephone Encounter (Signed)
Patient is stating the Front desk when she called told her she needed to talk to the Billing department. She states she called the Billing department and they do not do any billing for [facility]. She states Medicare is refusing to pay for her labs because we did not do a PA before she had the lab work done. She states Medicare is saying the labs are not neccessary. She states we have to call Medicare and tell them why she needed these labs and what plan we are doing for her. Informed her she needed to call [facility] and talk to them about this bill that she received from them. Informed her after she talks to them they will fax [place] over a form to fill out and send back to them. She states she will call them today

## 2023-05-03 NOTE — Progress Notes (Signed)
HFMS ordered and mailed to patient

## 2023-05-05 DIAGNOSIS — K8681 Exocrine pancreatic insufficiency: Secondary | ICD-10-CM | POA: Diagnosis not present

## 2023-05-05 DIAGNOSIS — I110 Hypertensive heart disease with heart failure: Secondary | ICD-10-CM | POA: Diagnosis not present

## 2023-05-05 DIAGNOSIS — G2581 Restless legs syndrome: Secondary | ICD-10-CM | POA: Diagnosis not present

## 2023-05-05 DIAGNOSIS — I5033 Acute on chronic diastolic (congestive) heart failure: Secondary | ICD-10-CM | POA: Diagnosis not present

## 2023-05-05 DIAGNOSIS — Z9884 Bariatric surgery status: Secondary | ICD-10-CM | POA: Diagnosis not present

## 2023-05-05 DIAGNOSIS — G4733 Obstructive sleep apnea (adult) (pediatric): Secondary | ICD-10-CM | POA: Diagnosis not present

## 2023-05-05 DIAGNOSIS — I2510 Atherosclerotic heart disease of native coronary artery without angina pectoris: Secondary | ICD-10-CM | POA: Diagnosis not present

## 2023-05-05 DIAGNOSIS — I130 Hypertensive heart and chronic kidney disease with heart failure and stage 1 through stage 4 chronic kidney disease, or unspecified chronic kidney disease: Secondary | ICD-10-CM | POA: Diagnosis not present

## 2023-05-05 DIAGNOSIS — N183 Chronic kidney disease, stage 3 unspecified: Secondary | ICD-10-CM | POA: Diagnosis not present

## 2023-05-05 DIAGNOSIS — D509 Iron deficiency anemia, unspecified: Secondary | ICD-10-CM | POA: Diagnosis not present

## 2023-05-05 DIAGNOSIS — Z7984 Long term (current) use of oral hypoglycemic drugs: Secondary | ICD-10-CM | POA: Diagnosis not present

## 2023-05-05 DIAGNOSIS — Z6841 Body Mass Index (BMI) 40.0 and over, adult: Secondary | ICD-10-CM | POA: Diagnosis not present

## 2023-05-05 DIAGNOSIS — E1142 Type 2 diabetes mellitus with diabetic polyneuropathy: Secondary | ICD-10-CM | POA: Diagnosis not present

## 2023-05-05 DIAGNOSIS — Z794 Long term (current) use of insulin: Secondary | ICD-10-CM | POA: Diagnosis not present

## 2023-05-05 DIAGNOSIS — Z955 Presence of coronary angioplasty implant and graft: Secondary | ICD-10-CM | POA: Diagnosis not present

## 2023-05-05 DIAGNOSIS — Z7901 Long term (current) use of anticoagulants: Secondary | ICD-10-CM | POA: Diagnosis not present

## 2023-05-05 DIAGNOSIS — E1122 Type 2 diabetes mellitus with diabetic chronic kidney disease: Secondary | ICD-10-CM | POA: Diagnosis not present

## 2023-05-05 DIAGNOSIS — Z79899 Other long term (current) drug therapy: Secondary | ICD-10-CM | POA: Diagnosis not present

## 2023-05-05 DIAGNOSIS — I4819 Other persistent atrial fibrillation: Secondary | ICD-10-CM | POA: Diagnosis not present

## 2023-05-06 ENCOUNTER — Telehealth: Payer: Self-pay

## 2023-05-06 DIAGNOSIS — I2510 Atherosclerotic heart disease of native coronary artery without angina pectoris: Secondary | ICD-10-CM | POA: Diagnosis not present

## 2023-05-06 DIAGNOSIS — I4819 Other persistent atrial fibrillation: Secondary | ICD-10-CM | POA: Diagnosis not present

## 2023-05-06 DIAGNOSIS — E1122 Type 2 diabetes mellitus with diabetic chronic kidney disease: Secondary | ICD-10-CM | POA: Diagnosis not present

## 2023-05-06 DIAGNOSIS — I130 Hypertensive heart and chronic kidney disease with heart failure and stage 1 through stage 4 chronic kidney disease, or unspecified chronic kidney disease: Secondary | ICD-10-CM | POA: Diagnosis not present

## 2023-05-06 DIAGNOSIS — N183 Chronic kidney disease, stage 3 unspecified: Secondary | ICD-10-CM | POA: Diagnosis not present

## 2023-05-06 DIAGNOSIS — I5033 Acute on chronic diastolic (congestive) heart failure: Secondary | ICD-10-CM | POA: Diagnosis not present

## 2023-05-06 NOTE — Telephone Encounter (Signed)
Spoke with patient- due to bill from [facility] for labs- she stated she spoke with medicare and they denied because they said the labs are not necessary- however if the provider call medicare and speaks with them they may cover the 650.00 charge for the labs- secondary insurance will not cover until Medicare pays their part.

## 2023-05-08 ENCOUNTER — Encounter: Payer: Self-pay | Admitting: Internal Medicine

## 2023-05-09 DIAGNOSIS — D508 Other iron deficiency anemias: Secondary | ICD-10-CM | POA: Diagnosis not present

## 2023-05-09 DIAGNOSIS — I10 Essential (primary) hypertension: Secondary | ICD-10-CM | POA: Diagnosis not present

## 2023-05-09 DIAGNOSIS — D500 Iron deficiency anemia secondary to blood loss (chronic): Secondary | ICD-10-CM | POA: Diagnosis not present

## 2023-05-09 DIAGNOSIS — E785 Hyperlipidemia, unspecified: Secondary | ICD-10-CM | POA: Diagnosis not present

## 2023-05-09 DIAGNOSIS — G4733 Obstructive sleep apnea (adult) (pediatric): Secondary | ICD-10-CM | POA: Diagnosis not present

## 2023-05-09 DIAGNOSIS — R3914 Feeling of incomplete bladder emptying: Secondary | ICD-10-CM | POA: Diagnosis not present

## 2023-05-09 DIAGNOSIS — R609 Edema, unspecified: Secondary | ICD-10-CM | POA: Diagnosis not present

## 2023-05-09 DIAGNOSIS — R809 Proteinuria, unspecified: Secondary | ICD-10-CM | POA: Diagnosis not present

## 2023-05-09 DIAGNOSIS — I509 Heart failure, unspecified: Secondary | ICD-10-CM | POA: Diagnosis not present

## 2023-05-09 DIAGNOSIS — E1122 Type 2 diabetes mellitus with diabetic chronic kidney disease: Secondary | ICD-10-CM | POA: Diagnosis not present

## 2023-05-09 DIAGNOSIS — Z9884 Bariatric surgery status: Secondary | ICD-10-CM | POA: Diagnosis not present

## 2023-05-09 DIAGNOSIS — N1832 Chronic kidney disease, stage 3b: Secondary | ICD-10-CM | POA: Diagnosis not present

## 2023-05-13 ENCOUNTER — Telehealth (INDEPENDENT_AMBULATORY_CARE_PROVIDER_SITE_OTHER): Payer: Medicare Other | Admitting: Nurse Practitioner

## 2023-05-13 ENCOUNTER — Encounter: Payer: Self-pay | Admitting: Nurse Practitioner

## 2023-05-13 DIAGNOSIS — G4733 Obstructive sleep apnea (adult) (pediatric): Secondary | ICD-10-CM | POA: Diagnosis not present

## 2023-05-13 DIAGNOSIS — R0609 Other forms of dyspnea: Secondary | ICD-10-CM

## 2023-05-13 DIAGNOSIS — I2510 Atherosclerotic heart disease of native coronary artery without angina pectoris: Secondary | ICD-10-CM | POA: Diagnosis not present

## 2023-05-13 DIAGNOSIS — N183 Chronic kidney disease, stage 3 unspecified: Secondary | ICD-10-CM | POA: Diagnosis not present

## 2023-05-13 DIAGNOSIS — E1122 Type 2 diabetes mellitus with diabetic chronic kidney disease: Secondary | ICD-10-CM | POA: Diagnosis not present

## 2023-05-13 DIAGNOSIS — I5032 Chronic diastolic (congestive) heart failure: Secondary | ICD-10-CM

## 2023-05-13 DIAGNOSIS — I5030 Unspecified diastolic (congestive) heart failure: Secondary | ICD-10-CM

## 2023-05-13 DIAGNOSIS — J90 Pleural effusion, not elsewhere classified: Secondary | ICD-10-CM | POA: Insufficient documentation

## 2023-05-13 DIAGNOSIS — I5033 Acute on chronic diastolic (congestive) heart failure: Secondary | ICD-10-CM | POA: Diagnosis not present

## 2023-05-13 DIAGNOSIS — I130 Hypertensive heart and chronic kidney disease with heart failure and stage 1 through stage 4 chronic kidney disease, or unspecified chronic kidney disease: Secondary | ICD-10-CM | POA: Diagnosis not present

## 2023-05-13 DIAGNOSIS — I4819 Other persistent atrial fibrillation: Secondary | ICD-10-CM | POA: Diagnosis not present

## 2023-05-13 NOTE — Patient Instructions (Signed)
Continue to use CPAP every night, minimum of 4-6 hours a night.  Change equipment as directed. Wash your tubing with warm soap and water daily, hang to dry. Wash humidifier portion weekly. Use bottled, distilled water and change daily Be aware of reduced alertness and do not drive or operate heavy machinery if experiencing this or drowsiness.  Exercise encouraged, as tolerated. Healthy weight management discussed.  Avoid or decrease alcohol consumption and medications that make you more sleepy, if possible. Notify if persistent daytime sleepiness occurs even with consistent use of PAP therapy.  Trial Symbicort 2 puffs Twice daily. Brush tongue and rinse mouth afterwards Continue Albuterol inhaler 2 puffs every 6 hours as needed for shortness of breath or wheezing. Notify if symptoms persist despite rescue inhaler/neb use.  Continue torsemide as directed by your heart doctor. Monitor your weights at the same time, in similar clothing daily. If you gain 2-3 lb overnight or 5 lb in a week, notify Dr. Shirlee Latch  Follow up in 4 weeks with new pulmonologist or Denise Shamaya Kauer,NP. If symptoms do not improve or worsen, please contact office for sooner follow up or seek emergency care.

## 2023-05-13 NOTE — Progress Notes (Deleted)
@Patient  ID: Denise Sharp, female    DOB: Feb 25, 1946, 77 y.o.   MRN: [id number]  Chief Complaint  Patient presents with   Follow-up    Pt here for F/U visit today. Pt has a lung fluid measuring device.    Referring provider: Sherlene Shams, MD  HPI: 77 year old female, never smoker followed for OSA/OHS on CPAP. She is a patient of Dr. Evlyn Courier and last seen in office 03/07/2023. Past medical history significant for CAD s/p PCI, CHF, HTN, PAF on Eliquis, diaphragmatic hernia, DM, CKD, HLD, chronic anxiety, depression.   TEST/EVENTS:   Allergies  Allergen Reactions   Demeclocycline Hives   Erythromycin Nausea And Vomiting and Other (See Comments)    Severe irritable bowel   Flagyl [Metronidazole] Nausea And Vomiting and Other (See Comments)    Severe irritable bowel   Glucophage [Metformin Hcl] Nausea And Vomiting and Other (See Comments)    "Sick" "I won't take anything that has metformin in it"   Tetracyclines & Related Hives and Rash   Potassium Chloride Other (See Comments)    Cannot swallow potssium pills, able to tolerate liquid potassium   Tetracycline Other (See Comments)   Diovan [Valsartan] Nausea Only        Sulfa Antibiotics Other (See Comments) and Rash    As child   Xanax [Alprazolam] Other (See Comments)    Hyperactivity     Immunization History  Administered Date(s) Administered   Fluad Quad(high Dose 65+) 03/06/2019, 06/07/2020, 07/27/2021, 07/13/2022   Fluad Trivalent(High Dose 65+) 03/29/2023   Influenza Split 06/13/2011   Influenza, High Dose Seasonal PF 08/21/2018   Influenza,inj,Quad PF,6+ Mos 05/04/2014, 05/12/2015   Influenza-Unspecified 04/15/2012   Moderna Sars-Covid-2 Vaccination 02/02/2020, 03/01/2020, 09/01/2020   Pneumococcal Conjugate-13 08/11/2013, 01/06/2014   Pneumococcal Polysaccharide-23 01/19/2010, 09/03/2018   Tdap 04/18/2016, 11/20/2020    Past Medical History:  Diagnosis Date   (HFpEF) heart failure with preserved  ejection fraction (HCC)    a. 12/2019 Echo: EF 65-70%, Gr2 DD. No significant valvular dzs.   Abnormality of gait 03/25/2013   Adrenal mass, left (HCC)    Anginal pain (HCC)    Anxiety    Aortic atherosclerosis (HCC)    Arthritis    Asthma    CAD (coronary artery disease) with h/o Atypical Chest Pain    a. 04/1986 Cath ([facility]): nl cors, EF 65%; b. 07/2012 Cath [facility]): Diff minor irregs; c. 07/2016 MV [facility]): "Equivocal"; d. 08/2016 Cardiac CT Ca2+ score [facility]): Ca2+ 1548; e. 05/2019 MV: No ischemia. EF 75%; f. 12/2019 PCI: LM nl, LAD 65ost (3.5x12 Resolute DES), 28m (2.75x12 Resolute DES),LCX/RCA nl; g. 08/2020 Cath: patent LAD stents, RCA 40ost, elev RH pressures, EF >65%.   Cervical spinal stenosis 1994   due to trauma to back ([employer] accident), has intermittent paralysis and parasthesias   Cervicogenic headache 03/23/2014   CKD (chronic kidney disease), stage III (HCC)    Depression    Diverticulosis    Dizziness    a.) chronic   DJD (degenerative joint disease)    a. Chronic R shoulder pain   Dyspnea    Esophageal stenosis 09/2009   a.) transient outlet obstruction by food, cleared by EGD   Family history of adverse reaction to anesthesia    a.) daughter with (+) PONV   Gastric bypass status for obesity    GERD (gastroesophageal reflux disease)    Headache(784.0)    HLD (hyperlipidemia)    Hypertension    IBS (irritable bowel  syndrome)    IDA (iron deficiency anemia)    a.) post 2 unit txfsn 2009, normal endo/colonoscopy by [facility]   Left bundle branch block (LBBB)    a.) Intermittently present - likely rate related.   NSVT (nonsustained ventricular tachycardia) (HCC) 11/15/2020   a.) Holter study 11/15/2020; 11 episodes of NSVT lasting up to 8 beats with a maximum rate of 210 bpm; 14 atrial runs lasting up to 18 beats with a rate of up to 250 bpm.  Some atrial runs felt to be NSVT.   NSVT (nonsustained ventricular tachycardia) (HCC)    a. 10/2020 [facility]: 11 runs of NSVT up to 8 beats.    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 10/2020 [facility]: 14 atrial runs up to 18 beats, max HR 250.   Restless leg syndrome    Rotator cuff arthropathy, right 08/13/2017   Syncope and collapse 03/12/2014   Type II diabetes mellitus (HCC)     Tobacco History: Social History   Tobacco Use  Smoking Status Never   Passive exposure: Never  Smokeless Tobacco Never   Counseling given: Not Answered   Outpatient Medications Prior to Visit  Medication Sig Dispense Refill   apixaban (ELIQUIS) 5 MG TABS tablet Take 1 tablet (5 mg total) by mouth 2 (two) times daily. 180 tablet 1   atorvastatin (LIPITOR) 80 MG tablet Take 1 tablet (80 mg total) by mouth daily. 90 tablet 1   budesonide-formoterol (SYMBICORT) 160-4.5 MCG/ACT inhaler Inhale 2 puffs into the lungs 2 (two) times daily. 1 each 6   butalbital-acetaminophen-caffeine (FIORICET) 50-325-40 MG tablet TAKE 1 TABLET BY MOUTH EVERY 6 HOURS AS NEEDED FOR HEADACHE. 60 tablet 2   Cholecalciferol (VITAMIN D3) 250 MCG (10000 UT) capsule Take 10,000 Units by mouth daily.     clotrimazole-betamethasone (LOTRISONE) cream Apply 1 application topically 2 (two) times daily as needed (irritation).     conjugated estrogens (PREMARIN) vaginal cream Apply one pea-sized amount around the opening of the urethra daily for 2 weeks, then 3 times weekly moving forward. 30 g 4   Continuous Blood Gluc Receiver (FREESTYLE LIBRE 2 READER) DEVI Use to check sugar at least TID 1 each 1   cyanocobalamin (VITAMIN B12) 1000 MCG/ML injection Inject 1 mL (1,000 mcg total) into the muscle every 30 (thirty) days. Inject 1 ml (1000 mcg ) IM weekly x 4,  Then monthly thereafter 3 mL 1   dapagliflozin propanediol (FARXIGA) 10 MG TABS tablet Take 1 tablet (10 mg total) by mouth daily. 90 tablet 3   dicyclomine (BENTYL) 20 MG tablet  Take 1 tablet (20 mg total) by mouth 3 (three) times daily before meals. 180 tablet 2   DULoxetine (CYMBALTA) 60 MG capsule Take 1 capsule (60 mg total) by mouth daily. 90 capsule 3   ezetimibe (ZETIA) 10 MG tablet Take 1 tablet (10 mg total) by mouth daily. 90 tablet 3   fexofenadine (ALLEGRA) 180 MG tablet Take 180 mg by mouth as needed for allergies or rhinitis.     Finerenone (KERENDIA) 10 MG TABS Take 1 tablet (10 mg total) by mouth in the morning. 90 tablet 3   fluticasone (FLONASE) 50 MCG/ACT nasal spray Place 2 sprays into both nostrils daily. 16 g 6   Glucagon (GVOKE HYPOPEN 2-PACK) 0.5 MG/0.1ML SOAJ Inject 1 auto injector subcutaneously as needed  for severe hypoglycemia. May repeat x 1 after 15 minutes if needed. 0.1 mL 2   glucose blood (ONETOUCH VERIO) test strip Use to check blood sugar twice daily. 100 each 1   hydrocortisone (ANUSOL-HC) 25 MG suppository Place 1 suppository (25 mg total) rectally 2 (two) times daily. 24 suppository 5   insulin aspart (NOVOLOG) 100 UNIT/ML injection Inject 10 Units into the skin 3 (three) times daily before meals.     insulin degludec (TRESIBA FLEXTOUCH) 100 UNIT/ML FlexTouch Pen Inject 12 Units into the skin daily. 15 mL 3   Insulin Pen Needle (DROPLET PEN NEEDLES) 31G X 5 MM MISC USE ONE NEEDLE SUBCUTANEOUSLY AS DIRECTED (REMOVE AND DISCARD NEEDLE IN SHARPS CONTAINER IMMEDIATELY AFTER USE) 100 each 1   lipase/protease/amylase (CREON) 36000 UNITS CPEP capsule Take 2 capsules (72,000 Units total) by mouth 3 (three) times daily before meals AND 1 capsule (36,000 Units total) with snacks. 300 capsule 9   loperamide (IMODIUM) 2 MG capsule Take 4-6 mg by mouth 3 (three) times daily before meals.     magnesium gluconate (MAGONATE) 500 MG tablet Take 1 tablet (500 mg total) by mouth daily. 90 tablet 1   metoprolol succinate (TOPROL-XL) 25 MG 24 hr tablet Take 0.5 tablets (12.5 mg total) by mouth daily. Take with or immediately following a meal. 45 tablet 3    nitroGLYCERIN (NITROSTAT) 0.4 MG SL tablet Place 1 tablet (0.4 mg total) under the tongue every 5 (five) minutes as needed for chest pain. 25 tablet 5   nystatin (MYCOSTATIN/NYSTOP) powder Apply 1 g topically 4 (four) times daily as needed (rash).     ondansetron (ZOFRAN-ODT) 4 MG disintegrating tablet Take 1 tablet (4 mg total) by mouth every 8 (eight) hours as needed for nausea or vomiting. 30 tablet 0   OneTouch Delica Lancets 33G MISC Used to check blood sugar two times a day. 100 each 4   OVER THE COUNTER MEDICATION Apply 1 application  topically daily as needed (pain). [place] Gel topical pain reliever     pantoprazole (PROTONIX) 40 MG tablet Take 1 tablet (40 mg total) by mouth daily. 90 tablet 3   Polyethyl Glycol-Propyl Glycol (SYSTANE OP) Place 1 drop into both eyes 4 (four) times daily as needed (dry eyes).     pregabalin (LYRICA) 50 MG capsule Take 1 capsule (50 mg total) by mouth 2 (two) times daily. 180 capsule 1   SYRINGE-NEEDLE, DISP, 3 ML (LUER LOCK SAFETY SYRINGES) 25G X 1" 3 ML MISC Use to give b12 injections once monthly 12 each 0   tiZANidine (ZANAFLEX) 4 MG tablet Take 1 tablet (4 mg total) by mouth every 6 (six) hours as needed for muscle spasms. 30 tablet 0   torsemide (DEMADEX) 20 MG tablet Take 40mg  every other day alternating with 60mg  every other day. 60 tablet 0   No facility-administered medications prior to visit.     Review of Systems:   Constitutional: No weight loss or gain, night sweats, fevers, chills, fatigue, or lassitude. HEENT: No headaches, difficulty swallowing, tooth/dental problems, or sore throat. No sneezing, itching, ear ache, nasal congestion, or post nasal drip CV:  No chest pain, orthopnea, PND, swelling in lower extremities, anasarca, dizziness, palpitations, syncope Resp: No shortness of breath with exertion or at rest. No excess mucus or change in color of mucus. No productive or non-productive. No hemoptysis. No wheezing.  No chest wall  deformity GI:  No heartburn, indigestion, abdominal pain, nausea, vomiting, diarrhea, change in bowel habits, loss  of appetite, bloody stools.  GU: No dysuria, change in color of urine, urgency or frequency.  No flank pain, no hematuria  Skin: No rash, lesions, ulcerations MSK:  No joint pain or swelling.  No decreased range of motion.  No back pain. Neuro: No dizziness or lightheadedness.  Psych: No depression or anxiety. Mood stable.     Physical Exam:  There were no vitals taken for this visit.  GEN: Pleasant, interactive, well-nourished/chronically-ill appearing/acutely-ill appearing/poorly-nourished/morbidly obese; in no acute distress.****** HEENT:  Normocephalic and atraumatic. EACs patent bilaterally. TM pearly gray with present light reflex bilaterally. PERRLA. Sclera white. Nasal turbinates pink, moist and patent bilaterally. No rhinorrhea present. Oropharynx pink and moist, without exudate or edema. No lesions, ulcerations, or postnasal drip.  NECK:  Supple w/ fair ROM. No JVD present. Normal carotid impulses w/o bruits. Thyroid symmetrical with no goiter or nodules palpated. No lymphadenopathy.   CV: RRR, no m/r/g, no peripheral edema. Pulses intact, +2 bilaterally. No cyanosis, pallor or clubbing. PULMONARY:  Unlabored, regular breathing. Clear bilaterally A&P w/o wheezes/rales/rhonchi. No accessory muscle use.  GI: BS present and normoactive. Soft, non-tender to palpation. No organomegaly or masses detected. No CVA tenderness. MSK: No erythema, warmth or tenderness. Cap refil <2 sec all extrem. No deformities or joint swelling noted.  Neuro: A/Ox3. No focal deficits noted.   Skin: Warm, no lesions or rashe Psych: Normal affect and behavior. Judgement and thought content appropriate.     Lab Results:  CBC    Component Value Date/Time   WBC 13.0 (H) 04/08/2023 1649   RBC 4.81 04/08/2023 1649   HGB 14.2 04/08/2023 1649   HGB 13.1 06/21/2014 1349   HCT 44.6 04/08/2023  1649   HCT 41.4 06/21/2014 1349   PLT 313 04/08/2023 1649   PLT 308 06/21/2014 1349   MCV 92.7 04/08/2023 1649   MCV 88 06/21/2014 1349   MCH 29.5 04/08/2023 1649   MCHC 31.8 04/08/2023 1649   RDW 14.6 04/08/2023 1649   RDW 15.4 (H) 06/21/2014 1349   LYMPHSABS 3.2 03/29/2023 0443   LYMPHSABS 2.3 02/25/2014 2153   MONOABS 0.7 03/29/2023 0443   MONOABS 0.6 02/25/2014 2153   EOSABS 0.2 03/29/2023 0443   EOSABS 0.1 02/25/2014 2153   BASOSABS 0.1 03/29/2023 0443   BASOSABS 0.1 02/25/2014 2153    BMET    Component Value Date/Time   NA 139 04/29/2023 1145   NA 142 03/05/2023 1326   NA 141 06/21/2014 1349   K 3.8 04/29/2023 1145   K 4.0 06/21/2014 1349   CL 97 04/29/2023 1145   CL 106 06/21/2014 1349   CO2 29 04/29/2023 1145   CO2 24 06/21/2014 1349   GLUCOSE 213 (H) 04/29/2023 1145   GLUCOSE 174 (H) 06/21/2014 1349   BUN 36 (H) 04/29/2023 1145   BUN 13 03/05/2023 1326   BUN 12 06/21/2014 1349   CREATININE 1.66 (H) 04/29/2023 1145   CREATININE 1.14 (H) 07/13/2022 1529   CALCIUM 9.4 04/29/2023 1145   CALCIUM 8.7 06/21/2014 1349   GFRNONAA 34 (L) 04/17/2023 1434   GFRNONAA 57 (L) 06/21/2014 1349   GFRNONAA 55 (L) 02/25/2014 2153   GFRNONAA 70 12/13/2011 0919   GFRAA >60 03/23/2020 1204   GFRAA >60 06/21/2014 1349   GFRAA >60 02/25/2014 2153   GFRAA 80 12/13/2011 0919    BNP    Component Value Date/Time   BNP 23.2 04/08/2023 1649     Imaging:  No results found.  ferumoxytol (FERAHEME) 510 mg in  sodium chloride 0.9 % 100 mL IVPB     Date Action Dose Route User   Discharged on 04/01/2023   Admitted on 03/28/2023   03/26/2023 1557 New Bag/Given 510 mg Intravenous Delena Bali, RN      ferumoxytol [facility]) 510 mg in sodium chloride 0.9 % 100 mL IVPB     Date Action Dose Route User   04/26/2023 0933 New Bag/Given 510 mg Intravenous Delena Bali, RN      0.9 %  sodium chloride infusion     Date Action Dose Route User   Discharged on 04/01/2023    Admitted on 03/28/2023   03/26/2023 1622 Rate/Dose Change (none) Intravenous Jannifer Rodney, RN   03/26/2023 1612 Rate/Dose Change (none) Intravenous Jannifer Rodney, RN   03/26/2023 1546 New Bag/Given (none) Intravenous Delena Bali, RN      0.9 %  sodium chloride infusion     Date Action Dose Route User   04/26/2023 [phone] Infusion Verify (none) Intravenous Delena Bali, RN   04/26/2023 2956 Rate/Dose Change (none) Intravenous Delena Bali, RN   04/26/2023 2130 Rate/Dose Change (none) Intravenous Delena Bali, RN   04/26/2023 8657 Rate/Dose Change (none) Intravenous Delena Bali, RN   04/26/2023 8469 New Bag/Given (none) Intravenous Delena Bali, RN          Latest Ref Rng & Units 05/04/2019    2:59 PM  PFT Results  FVC-Pre L 2.53   FVC-Predicted Pre % 87   FVC-Post L 2.52   FVC-Predicted Post % 87   Pre FEV1/FVC % % 85   Post FEV1/FCV % % 88   FEV1-Pre L 2.16   FEV1-Predicted Pre % 99   FEV1-Post L 2.22   DLCO uncorrected ml/min/mmHg 16.47   DLCO UNC% % 85   DLVA Predicted % 99   TLC L 4.90   TLC % Predicted % 96   RV % Predicted % 103     Lab Results  Component Value Date   NITRICOXIDE 9 03/07/2023        Assessment & Plan:   No problem-specific Assessment & Plan notes found for this encounter.   I spent *** minutes of dedicated to the care of this patient on the date of this encounter to include pre-visit review of records, face-to-face time with the patient discussing conditions above, post visit ordering of testing, clinical documentation with the electronic health record, making appropriate referrals as documented, and communicating necessary findings to members of the patients care team.  Noemi Chapel, NP 05/13/2023  Pt aware and understands NP's role.

## 2023-05-13 NOTE — Assessment & Plan Note (Addendum)
Excellent compliance and control. Receives benefit from use. Encouraged to continue utilizing nightly. Aware of proper care/use of device. Safe driving practices reviewed.  Patient Instructions  Continue to use CPAP every night, minimum of 4-6 hours a night.  Change equipment as directed. Wash your tubing with warm soap and water daily, hang to dry. Wash humidifier portion weekly. Use bottled, distilled water and change daily Be aware of reduced alertness and do not drive or operate heavy machinery if experiencing this or drowsiness.  Exercise encouraged, as tolerated. Healthy weight management discussed.  Avoid or decrease alcohol consumption and medications that make you more sleepy, if possible. Notify if persistent daytime sleepiness occurs even with consistent use of PAP therapy.  Trial Symbicort 2 puffs Twice daily. Brush tongue and rinse mouth afterwards Continue Albuterol inhaler 2 puffs every 6 hours as needed for shortness of breath or wheezing. Notify if symptoms persist despite rescue inhaler/neb use.  Continue torsemide as directed by your heart doctor. Monitor your weights at the same time, in similar clothing daily. If you gain 2-3 lb overnight or 5 lb in a week, notify Dr. Shirlee Latch  Follow up in 4 weeks with new pulmonologist or Katie Mory Herrman,NP. If symptoms do not improve or worsen, please contact office for sooner follow up or seek emergency care.

## 2023-05-13 NOTE — Assessment & Plan Note (Signed)
B/l small effusions secondary to volume overload from CHF/CKD. DOE/cough stable per her report. Advised her to monitor for acute worsening and seek sooner f/u or emergency care.

## 2023-05-13 NOTE — Progress Notes (Addendum)
Patient ID: Denise Sharp, female     DOB: [birth year], 77 y.o.      MRN: [id number]  Chief Complaint  Patient presents with   Follow-up    Pt here for F/U visit today. Pt has a lung fluid measuring device.    Virtual Visit via Video Note  I connected with Denise Sharp on 05/13/23 at  2:30 PM EDT by a video enabled telemedicine application and verified that I am speaking with the correct person using two identifiers.  Location: Patient: Home Provider: Office   I discussed the limitations of evaluation and management by telemedicine and the availability of in person appointments. The patient expressed understanding and agreed to proceed.  History of Present Illness: 77 year old female, never smoker followed for OSA/OHS on CPAP. She is a patient of Dr. Evlyn Courier and last seen in office 03/07/2023. Past medical history significant for CAD s/p PCI, CHF, HTN, PAF on Eliquis, diaphragmatic hernia, DM, CKD, HLD, chronic anxiety, depression.   TESTS/EVENTS: 08/14/2012 PSG: AHI 13.2/h, SpO2 low 85% 12/14/2022 CTA chest: mild cardiomegaly. No PE. No LAD. Trace pleural effusions. Increased bronchial thickening. Increased mosaic attenuation throughout the lung fields, consistent with small airways disease.  03/07/2023 FeNO 9 ppb 03/28/2023 CXR: cardiomegaly and vascular congestion. Diffuse increased interstitial opacities. Probable small effusions.   03/07/2023: Ov with Dr. Craige Cotta. Uses CPAP nightly. No issues with mask fit or pressure. Orders placed for new machine 5-15 cmH2O. Gets winded with minimal activity. Takes about 30 minutes to recover. Has dry cough. Tries tessalon - helps sometimes. Hasn't used albuterol recently. DOE likely from diastolic CHF and deconditioning. CT did show changes suggestive of asthma. Trial Symbicort. Discussed importance of maintaining a regular exercise regimen.   05/13/2023: Today - follow up Patient presents today for follow up. Since she was here last, she's been  having a lot of trouble with her CHF. She was hospitalized in September for acute CHF exacerbation. They have been trying to get her fluid volume status better managed without damaging her kidneys. She's now wearing a [employer] Heart Failure Management System for the next several months prescribed by Dr. Shirlee Latch. She is currently alternating torsemide 60 mg with 40 mg daily. She has been relatively stable since she saw him last on 10/16. She feels like her breathing is unchanged since she was here last. She has trouble getting a deep breath at times. She has a daily cough that is worse in the mornings and improves throughout the day; dry. She does notice an occasional wheeze when she gets the cough. She denies any worsening leg swelling, PND, weight gain, CP. She is not using the Symbicort routinely. She's used it a few times but it has been sporadic. Doesn't use her rescue inhaler. She sleeps with her CPAP nightly. Receives benefit from use. No orthopnea, drowsy driving, sleep parasomnias.   04/12/2023-05/11/2023: CPAP 5-15 cmH2O 30/30 days; 97% >4 hr; average use 6 hr 56 min Pressure 95th 8.8 Leaks 95th 17.7 AHI 1.5  Allergies  Allergen Reactions   Demeclocycline Hives   Erythromycin Nausea And Vomiting and Other (See Comments)    Severe irritable bowel   Flagyl [Metronidazole] Nausea And Vomiting and Other (See Comments)    Severe irritable bowel   Glucophage [Metformin Hcl] Nausea And Vomiting and Other (See Comments)    "Sick" "I won't take anything that has metformin in it"   Tetracyclines & Related Hives and Rash   Potassium Chloride Other (See Comments)  Cannot swallow potssium pills, able to tolerate liquid potassium   Tetracycline Other (See Comments)   Diovan [Valsartan] Nausea Only        Sulfa Antibiotics Other (See Comments) and Rash    As child   Xanax [Alprazolam] Other (See Comments)    Hyperactivity    Immunization History  Administered Date(s) Administered   Fluad  Quad(high Dose 65+) 03/06/2019, 06/07/2020, 07/27/2021, 07/13/2022   Fluad Trivalent(High Dose 65+) 03/29/2023   Influenza Split 06/13/2011   Influenza, High Dose Seasonal PF 08/21/2018   Influenza,inj,Quad PF,6+ Mos 05/04/2014, 05/12/2015   Influenza-Unspecified 04/15/2012   Moderna Sars-Covid-2 Vaccination 02/02/2020, 03/01/2020, 09/01/2020   Pneumococcal Conjugate-13 08/11/2013, 01/06/2014   Pneumococcal Polysaccharide-23 01/19/2010, 09/03/2018   Tdap 04/18/2016, 11/20/2020   Past Medical History:  Diagnosis Date   (HFpEF) heart failure with preserved ejection fraction (HCC)    a. 12/2019 Echo: EF 65-70%, Gr2 DD. No significant valvular dzs.   Abnormality of gait 03/25/2013   Adrenal mass, left (HCC)    Anginal pain (HCC)    Anxiety    Aortic atherosclerosis (HCC)    Arthritis    Asthma    CAD (coronary artery disease) with h/o Atypical Chest Pain    a. 04/1986 Cath ([facility]): nl cors, EF 65%; b. 07/2012 Cath [facility]): Diff minor irregs; c. 07/2016 MV [facility]): "Equivocal"; d. 08/2016 Cardiac CT Ca2+ score [facility]): Ca2+ 1548; e. 05/2019 MV: No ischemia. EF 75%; f. 12/2019 PCI: LM nl, LAD 65ost (3.5x12 Resolute DES), 78m (2.75x12 Resolute DES),LCX/RCA nl; g. 08/2020 Cath: patent LAD stents, RCA 40ost, elev RH pressures, EF >65%.   Cervical spinal stenosis 1994   due to trauma to back ([employer] accident), has intermittent paralysis and parasthesias   Cervicogenic headache 03/23/2014   CKD (chronic kidney disease), stage III (HCC)    Depression    Diverticulosis    Dizziness    a.) chronic   DJD (degenerative joint disease)    a. Chronic R shoulder pain   Dyspnea    Esophageal stenosis 09/2009   a.) transient outlet obstruction by food, cleared by EGD   Family history of adverse reaction to anesthesia    a.) daughter with (+) PONV   Gastric bypass status for obesity    GERD (gastroesophageal reflux disease)    Headache(784.0)    HLD (hyperlipidemia)    Hypertension    IBS (irritable bowel  syndrome)    IDA (iron deficiency anemia)    a.) post 2 unit txfsn 2009, normal endo/colonoscopy by [facility]   Left bundle branch block (LBBB)    a.) Intermittently present - likely rate related.   NSVT (nonsustained ventricular tachycardia) (HCC) 11/15/2020   a.) Holter study 11/15/2020; 11 episodes of NSVT lasting up to 8 beats with a maximum rate of 210 bpm; 14 atrial runs lasting up to 18 beats with a rate of up to 250 bpm.  Some atrial runs felt to be NSVT.   NSVT (nonsustained ventricular tachycardia) (HCC)    a. 10/2020 [facility]: 11 runs of NSVT up to 8 beats.   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 10/2020 [facility]: 14 atrial runs up to 18 beats, max HR 250.   Restless leg syndrome    Rotator cuff arthropathy, right 08/13/2017   Syncope and collapse 03/12/2014   Type II diabetes mellitus (HCC)  Tobacco History: Social History   Tobacco Use  Smoking Status Never   Passive exposure: Never  Smokeless Tobacco Never   Counseling given: Not Answered   Outpatient Medications Prior to Visit  Medication Sig Dispense Refill   apixaban (ELIQUIS) 5 MG TABS tablet Take 1 tablet (5 mg total) by mouth 2 (two) times daily. 180 tablet 1   atorvastatin (LIPITOR) 80 MG tablet Take 1 tablet (80 mg total) by mouth daily. 90 tablet 1   budesonide-formoterol (SYMBICORT) 160-4.5 MCG/ACT inhaler Inhale 2 puffs into the lungs 2 (two) times daily. 1 each 6   butalbital-acetaminophen-caffeine (FIORICET) 50-325-40 MG tablet TAKE 1 TABLET BY MOUTH EVERY 6 HOURS AS NEEDED FOR HEADACHE. 60 tablet 2   Cholecalciferol (VITAMIN D3) 250 MCG (10000 UT) capsule Take 10,000 Units by mouth daily.     clotrimazole-betamethasone (LOTRISONE) cream Apply 1 application topically 2 (two) times daily as needed (irritation).     conjugated estrogens (PREMARIN) vaginal cream  Apply one pea-sized amount around the opening of the urethra daily for 2 weeks, then 3 times weekly moving forward. 30 g 4   Continuous Blood Gluc Receiver (FREESTYLE LIBRE 2 READER) DEVI Use to check sugar at least TID 1 each 1   cyanocobalamin (VITAMIN B12) 1000 MCG/ML injection Inject 1 mL (1,000 mcg total) into the muscle every 30 (thirty) days. Inject 1 ml (1000 mcg ) IM weekly x 4,  Then monthly thereafter 3 mL 1   dapagliflozin propanediol (FARXIGA) 10 MG TABS tablet Take 1 tablet (10 mg total) by mouth daily. 90 tablet 3   dicyclomine (BENTYL) 20 MG tablet Take 1 tablet (20 mg total) by mouth 3 (three) times daily before meals. 180 tablet 2   DULoxetine (CYMBALTA) 60 MG capsule Take 1 capsule (60 mg total) by mouth daily. 90 capsule 3   ezetimibe (ZETIA) 10 MG tablet Take 1 tablet (10 mg total) by mouth daily. 90 tablet 3   fexofenadine (ALLEGRA) 180 MG tablet Take 180 mg by mouth as needed for allergies or rhinitis.     Finerenone (KERENDIA) 10 MG TABS Take 1 tablet (10 mg total) by mouth in the morning. 90 tablet 3   fluticasone (FLONASE) 50 MCG/ACT nasal spray Place 2 sprays into both nostrils daily. 16 g 6   Glucagon (GVOKE HYPOPEN 2-PACK) 0.5 MG/0.1ML SOAJ Inject 1 auto injector subcutaneously as needed for severe hypoglycemia. May repeat x 1 after 15 minutes if needed. 0.1 mL 2   glucose blood (ONETOUCH VERIO) test strip Use to check blood sugar twice daily. 100 each 1   hydrocortisone (ANUSOL-HC) 25 MG suppository Place 1 suppository (25 mg total) rectally 2 (two) times daily. 24 suppository 5   insulin aspart (NOVOLOG) 100 UNIT/ML injection Inject 10 Units into the skin 3 (three) times daily before meals.     insulin degludec (TRESIBA FLEXTOUCH) 100 UNIT/ML FlexTouch Pen Inject 12 Units into the skin daily. 15 mL 3   Insulin Pen Needle (DROPLET PEN NEEDLES) 31G X 5 MM MISC USE ONE NEEDLE SUBCUTANEOUSLY AS DIRECTED (REMOVE AND DISCARD NEEDLE IN SHARPS CONTAINER IMMEDIATELY AFTER USE)  100 each 1   lipase/protease/amylase (CREON) 36000 UNITS CPEP capsule Take 2 capsules (72,000 Units total) by mouth 3 (three) times daily before meals AND 1 capsule (36,000 Units total) with snacks. 300 capsule 9   loperamide (IMODIUM) 2 MG capsule Take 4-6 mg by mouth 3 (three) times daily before meals.     magnesium gluconate (MAGONATE) 500 MG tablet Take 1 tablet (  500 mg total) by mouth daily. 90 tablet 1   metoprolol succinate (TOPROL-XL) 25 MG 24 hr tablet Take 0.5 tablets (12.5 mg total) by mouth daily. Take with or immediately following a meal. 45 tablet 3   nitroGLYCERIN (NITROSTAT) 0.4 MG SL tablet Place 1 tablet (0.4 mg total) under the tongue every 5 (five) minutes as needed for chest pain. 25 tablet 5   nystatin (MYCOSTATIN/NYSTOP) powder Apply 1 g topically 4 (four) times daily as needed (rash).     ondansetron (ZOFRAN-ODT) 4 MG disintegrating tablet Take 1 tablet (4 mg total) by mouth every 8 (eight) hours as needed for nausea or vomiting. 30 tablet 0   OneTouch Delica Lancets 33G MISC Used to check blood sugar two times a day. 100 each 4   OVER THE COUNTER MEDICATION Apply 1 application  topically daily as needed (pain). [place] Gel topical pain reliever     pantoprazole (PROTONIX) 40 MG tablet Take 1 tablet (40 mg total) by mouth daily. 90 tablet 3   Polyethyl Glycol-Propyl Glycol (SYSTANE OP) Place 1 drop into both eyes 4 (four) times daily as needed (dry eyes).     pregabalin (LYRICA) 50 MG capsule Take 1 capsule (50 mg total) by mouth 2 (two) times daily. 180 capsule 1   SYRINGE-NEEDLE, DISP, 3 ML (LUER LOCK SAFETY SYRINGES) 25G X 1" 3 ML MISC Use to give b12 injections once monthly 12 each 0   tiZANidine (ZANAFLEX) 4 MG tablet Take 1 tablet (4 mg total) by mouth every 6 (six) hours as needed for muscle spasms. 30 tablet 0   torsemide (DEMADEX) 20 MG tablet Take 40mg  every other day alternating with 60mg  every other day. 60 tablet 0   No facility-administered medications prior to  visit.     Review of Systems:   Constitutional: No weight loss or gain, night sweats, fevers, chills, or lassitude. +fatigue (baseline) HEENT: No headaches, difficulty swallowing, tooth/dental problems, or sore throat. No sneezing, itching, ear ache, nasal congestion, or post nasal drip CV:  +baseline swelling in lower extremities. No chest pain, orthopnea, PND, anasarca, dizziness, palpitations, syncope Resp: +shortness of breath with exertion; daily dry cough; occasional wheeze. No excess mucus or change in color of mucus. No hemoptysis. No chest wall deformity GI:  No heartburn, indigestion, abdominal pain, nausea, vomiting, diarrhea, change in bowel habits, loss of appetite, bloody stools.  GU: No dysuria, change in color of urine, urgency. +frequency with fluid pills Skin: No rash, lesions, ulcerations MSK:  No joint pain or swelling.   Neuro: No dizziness or lightheadedness.  Psych: No depression or anxiety. Mood stable.   Observations/Objective: Patient is well-developed, well-nourished in no acute distress. A&Ox3. Resting comfortably at home. Unlabored breathing. Speech is clear and coherent with logical content.    Assessment and Plan: OSA on CPAP Excellent compliance and control. Receives benefit from use. Encouraged to continue utilizing nightly. Aware of proper care/use of device. Safe driving practices reviewed.  Patient Instructions  Continue to use CPAP every night, minimum of 4-6 hours a night.  Change equipment as directed. Wash your tubing with warm soap and water daily, hang to dry. Wash humidifier portion weekly. Use bottled, distilled water and change daily Be aware of reduced alertness and do not drive or operate heavy machinery if experiencing this or drowsiness.  Exercise encouraged, as tolerated. Healthy weight management discussed.  Avoid or decrease alcohol consumption and medications that make you more sleepy, if possible. Notify if persistent daytime  sleepiness occurs even  with consistent use of PAP therapy.  Trial Symbicort 2 puffs Twice daily. Brush tongue and rinse mouth afterwards Continue Albuterol inhaler 2 puffs every 6 hours as needed for shortness of breath or wheezing. Notify if symptoms persist despite rescue inhaler/neb use.  Continue torsemide as directed by your heart doctor. Monitor your weights at the same time, in similar clothing daily. If you gain 2-3 lb overnight or 5 lb in a week, notify Dr. Shirlee Latch  Follow up in 4 weeks with new pulmonologist or Katie Samit Sylve,NP. If symptoms do not improve or worsen, please contact office for sooner follow up or seek emergency care.    DOE (dyspnea on exertion) Multifactorial related to CHF and deconditioning. She does have evidence of small airways disease on previous imaging. Discussed role of ICS/LABA and trial of this. She is willing to utilize on a scheduled regimen to see if she receives benefit. Will reassess at follow up. Action plan in place. Encouraged to work on graded exercises. Follow up with cardiology as scheduled.   (HFpEF) heart failure with preserved ejection fraction (HCC) Difficulties managing volume status per her report. Stable today. Follow up with Dr. Mclean/HF clinic team as scheduled. Advised to monitor weights at home.  Pleural effusion B/l small effusions secondary to volume overload from CHF/CKD. DOE/cough stable per her report. Advised her to monitor for acute worsening and seek sooner f/u or emergency care.     I discussed the assessment and treatment plan with the patient. The patient was provided an opportunity to ask questions and all were answered. The patient agreed with the plan and demonstrated an understanding of the instructions.   The patient was advised to call back or seek an in-person evaluation if the symptoms worsen or if the condition fails to improve as anticipated.  I provided 32 minutes of non-face-to-face time during this  encounter.   Noemi Chapel, NP

## 2023-05-13 NOTE — Assessment & Plan Note (Signed)
Difficulties managing volume status per her report. Stable today. Follow up with Dr. Mclean/HF clinic team as scheduled. Advised to monitor weights at home.

## 2023-05-13 NOTE — Assessment & Plan Note (Signed)
Multifactorial related to CHF and deconditioning. She does have evidence of small airways disease on previous imaging. Discussed role of ICS/LABA and trial of this. She is willing to utilize on a scheduled regimen to see if she receives benefit. Will reassess at follow up. Action plan in place. Encouraged to work on graded exercises. Follow up with cardiology as scheduled.

## 2023-05-15 ENCOUNTER — Encounter: Payer: Self-pay | Admitting: Internal Medicine

## 2023-05-15 ENCOUNTER — Ambulatory Visit: Payer: Medicare Other | Attending: Internal Medicine | Admitting: Internal Medicine

## 2023-05-15 VITALS — BP 106/58 | HR 76 | Ht 64.0 in | Wt 249.4 lb

## 2023-05-15 DIAGNOSIS — K921 Melena: Secondary | ICD-10-CM | POA: Diagnosis not present

## 2023-05-15 DIAGNOSIS — D509 Iron deficiency anemia, unspecified: Secondary | ICD-10-CM | POA: Diagnosis not present

## 2023-05-15 DIAGNOSIS — E1169 Type 2 diabetes mellitus with other specified complication: Secondary | ICD-10-CM | POA: Diagnosis not present

## 2023-05-15 DIAGNOSIS — I5032 Chronic diastolic (congestive) heart failure: Secondary | ICD-10-CM | POA: Diagnosis not present

## 2023-05-15 DIAGNOSIS — I480 Paroxysmal atrial fibrillation: Secondary | ICD-10-CM | POA: Diagnosis not present

## 2023-05-15 DIAGNOSIS — I25118 Atherosclerotic heart disease of native coronary artery with other forms of angina pectoris: Secondary | ICD-10-CM | POA: Insufficient documentation

## 2023-05-15 DIAGNOSIS — E785 Hyperlipidemia, unspecified: Secondary | ICD-10-CM | POA: Insufficient documentation

## 2023-05-15 DIAGNOSIS — I10 Essential (primary) hypertension: Secondary | ICD-10-CM | POA: Diagnosis not present

## 2023-05-15 MED ORDER — POTASSIUM CHLORIDE 20 MEQ/15ML (10%) PO SOLN: 20.0000 meq | Freq: Every day | ORAL | 3 refills | Status: DC

## 2023-05-15 MED ORDER — TORSEMIDE 20 MG PO TABS: ORAL_TABLET | ORAL | 2 refills | Status: DC

## 2023-05-15 NOTE — Progress Notes (Signed)
Cardiology Office Note:  .   Date:  05/15/2023  ID:  Denise Sharp, DOB 1945/07/24, MRN [id number] PCP: Denise Shams, MD  Denise Sharp Cardiologist:  Denise Kendall, MD     History of Present Illness: .   Denise Sharp is a 77 y.o. female with history of CAD status post PCI to the ostial and mid LAD in the setting of unstable angina in 12/2019, chronic HFpEF, recently diagnosed atrial fibrillation, hypertension, type 2 diabetes mellitus, iron deficiency anemia following gastric bypass x2, irritable bowel syndrome, esophageal stenosis, chronic dizziness with gait instability, cervicogenic headache with cervical spine stenosis, obstructive sleep apnea on CPAP, restless leg syndrome, and adrenal mass, who presents for preoperative cardiovascular risk assessment.  I last saw her in early September, at which time his Denise Sharp had multiple complaints including progressive shortness of breath and wheezing, significant weight gain, leg swelling, abdominal distention with early satiety, and intermittent chest pain.  She also noted some rectal bleeding.  She was referred to the emergency department for further evaluation and medical optimization.  She was subsequently seen in our advanced heart failure clinic, at which time she reported some improvement in her dyspnea with IV diuresis.  At her follow-up visit on 04/24/2023, she complained of increased shortness of breath and decreased exercise capacity.  She had some improved response to self titration of torsemide up to 40 mg daily, though she did not like to take this too frequently on account of urinary frequency.  After several recommended taking torsemide 60 mg daily for 2 days followed by 40 mg daily.  She saw Dr. Shirlee Latch a week later, which time she noted some improvement in her dyspnea and weight with increased dose of torsemide, though it had been reduced by the PCP after creatinine was checked and found to be higher than  baseline.  Since de-escalation of torsemide, weight gain and shortness of breath had recrudesced.  Today, [name] reports that she is a little more short of breath and edematous.  She is currently alternating between 40 and 60 mg of torsemide a day.  Her weight seems to be going up and down based on how much torsemide she is using.  She continues to get out of breath with minimal activity at times.  She has not had any frank chest pain.  She notes some dizziness getting in the car today, but she wonders if elevated blood sugar may have been contributing to this.  She has not had any further rectal bleeding and remains on apixaban.  She noticed a "knot" on her right calf earlier this week, not recalling any trauma to the area.  It has been stable.  She is currently wearing a device from [employer] to help monitor her volume status, though she notes that only the heart failure team is able to review the readings.  She reports waking up with palpitations a couple of nights ago, which were self-limited and without associated symptoms.  ROS: See HPI  Studies Reviewed: [name]   EKG Interpretation Date/Time:  Wednesday May 15 2023 16:23:53 EDT Ventricular Rate:  82 PR Interval:  150 QRS Duration:  70 QT Interval:  398 QTC Calculation: 464 R Axis:   0  Text Interpretation: Sinus rhythm with frequent Premature ventricular complexes Abnormal ECG When compared with ECG of 01-May-2023 15:20, Premature ventricular complexes are now Present Confirmed by Wylene Weissman, Cristal Deer [phone]) on 05/15/2023 7:17:41 PM    TTE (03/29/2023):  1. Left ventricular ejection fraction,  by estimation, is 60 to 65%. The  left ventricle has normal function. The left ventricle has no regional  wall motion abnormalities. There is mild left ventricular hypertrophy.  Left ventricular diastolic parameters  are consistent with Grade II diastolic dysfunction (pseudonormalization).   2. Right ventricular systolic function is normal. The right  ventricular  size is normal.   3. Left atrial size was mild to moderately dilated.   4. Right atrial size was mildly dilated.   5. The mitral valve is degenerative. No evidence of mitral valve  regurgitation.   6. The aortic valve was not well visualized. Aortic valve regurgitation  is mild.   Risk Assessment/Calculations:    CHA2DS2-VASc Score = 7   This indicates a 11.2% annual risk of stroke. The patient's score is based upon: CHF History: 1 HTN History: 1 Diabetes History: 1 Stroke History: 0 Vascular Disease History: 1 Age Score: 2 Gender Score: 1            Physical Exam:   VS:  BP (!) 106/58 (BP Location: Right Arm)   Pulse 76   Ht 5\' 4"  (1.626 m)   Wt 249 lb 6.4 oz (113.1 kg)   SpO2 94%   BMI 42.81 kg/m    Wt Readings from Last 3 Encounters:  05/15/23 249 lb 6.4 oz (113.1 kg)  05/01/23 249 lb (112.9 kg)  05/01/23 245 lb (111.1 kg)    General:  NAD. Neck: JVP approximately 8-10 cm but difficult to assess due to body habitus. Lungs: Clear to auscultation bilaterally without wheezes or crackles. Heart: Regular rate and rhythm with frequent extrasystoles and 2/6 systolic murmur. Abdomen: Soft, nontender, nondistended. Extremities: 1+ pretibial edema bilaterally.  ASSESSMENT AND PLAN: .    Chronic HFpEF: Denise Sharp still appears mildly volume overloaded on exam today.  I have encouraged her to increase her torsemide to 60 mg daily until she is seen in follow-up in the heart failure clinic next week.  Given that her potassium levels have been borderline low and multiple PVCs are noted on EKG today, I have prescribed potassium 20 mEq daily to be taken as well.  I recommend that BMP be checked when she is seen by Dr. Gasper Lloyd next week.  Continue dapagliflozin.  Coronary artery disease with stable angina: No frank chest pain reported.  Chronic dyspnea with minimal activity is likely multifactorial and driven primarily by her HFpEF and deconditioning.  No role  for repeat ischemia evaluation at this time.  Paroxysmal atrial fibrillation: EKG today demonstrates sinus rhythm.  Denise Sharp reports a recent episode of palpitations, which could have been due to PAF, frequent PVCs, or some other arrhythmia.  She should continue apixaban and metoprolol, as currently prescribed, as long as she does not have any further bleeding.  Hematochezia and iron deficiency anemia: No further bleeding reported.  GI has recommended colonoscopy, though it is currently on hold pending cardiovascular clearance.  As Ms. Yaeger is still volume overloaded with significant dyspnea with minimal activity, I favor further optimization of her volume status before proceeding with elective procedures.  This can be readdressed when she sees Dr. Gasper Lloyd next week or when she follows up with me in December.  Hypertension: Blood pressure low normal today with brief episode of dizziness earlier today.  Other than escalation of torsemide, we will not make any medication changes today, though we may need to consider decreasing metoprolol in the future if she continues to have soft blood pressure with dizziness.  I  worry this could exacerbate her palpitations, however.  Hyperlipidemia associated with type 2 diabetes mellitus: Continue diet and and minimized.  Ongoing management of diabetes mellitus per PCP and endocrinology.    Dispo: Follow-up next week as scheduled with Dr. Gasper Lloyd.  Return to see me in ~6 weeks.  Signed, Denise Kendall, MD

## 2023-05-15 NOTE — Patient Instructions (Signed)
Medication Instructions:  Increase Torsemide to 60 mg daily  Start Potassium 20 meq daily.   *If you need a refill on your cardiac medications before your next appointment, please call your pharmacy*   Follow-Up: At [facility], you and your health needs are our priority.  As part of our continuing mission to provide you with exceptional heart care, we have created designated Provider Care Teams.  These Care Teams include your primary Cardiologist (physician) and Advanced Practice Providers (APPs -  Physician Assistants and Nurse Practitioners) who all work together to provide you with the care you need, when you need it.  We recommend signing up for the patient portal called "MyChart".  Sign up information is provided on this After Visit Summary.  MyChart is used to connect with patients for Virtual Visits (Telemedicine).  Patients are able to view lab/test results, encounter notes, upcoming appointments, etc.  Non-urgent messages can be sent to your provider as well.   To learn more about what you can do with MyChart, go to [email or web address].    Your next appointment:   December   Provider:   [name], MD

## 2023-05-17 ENCOUNTER — Telehealth: Payer: Self-pay | Admitting: Internal Medicine

## 2023-05-17 NOTE — Telephone Encounter (Signed)
As Ms. Marcel is still volume overloaded with significant dyspnea with minimal activity, I favor further optimization of her volume status before proceeding with elective procedures.  This can be readdressed when she sees Dr. Gasper Lloyd next week or when she follows up with me in December.     Signed, [name], MD

## 2023-05-17 NOTE — Telephone Encounter (Signed)
Denise Sharp from [place] called stating the pt missed PT on yesterday because she did not answer the door

## 2023-05-17 NOTE — Telephone Encounter (Signed)
FYI

## 2023-05-20 DIAGNOSIS — I2510 Atherosclerotic heart disease of native coronary artery without angina pectoris: Secondary | ICD-10-CM | POA: Diagnosis not present

## 2023-05-20 DIAGNOSIS — N183 Chronic kidney disease, stage 3 unspecified: Secondary | ICD-10-CM | POA: Diagnosis not present

## 2023-05-20 DIAGNOSIS — I4819 Other persistent atrial fibrillation: Secondary | ICD-10-CM | POA: Diagnosis not present

## 2023-05-20 DIAGNOSIS — I5033 Acute on chronic diastolic (congestive) heart failure: Secondary | ICD-10-CM | POA: Diagnosis not present

## 2023-05-20 DIAGNOSIS — E1122 Type 2 diabetes mellitus with diabetic chronic kidney disease: Secondary | ICD-10-CM | POA: Diagnosis not present

## 2023-05-20 DIAGNOSIS — I130 Hypertensive heart and chronic kidney disease with heart failure and stage 1 through stage 4 chronic kidney disease, or unspecified chronic kidney disease: Secondary | ICD-10-CM | POA: Diagnosis not present

## 2023-05-23 ENCOUNTER — Other Ambulatory Visit
Admission: RE | Admit: 2023-05-23 | Discharge: 2023-05-23 | Disposition: A | Discharge status: Home / Self Care | Payer: Medicare Other | Source: Ambulatory Visit | Attending: Cardiology | Admitting: Cardiology

## 2023-05-23 DIAGNOSIS — I5032 Chronic diastolic (congestive) heart failure: Secondary | ICD-10-CM | POA: Diagnosis not present

## 2023-05-23 LAB — BASIC METABOLIC PANEL
Anion gap: 12 (ref 5–15)
BUN: 29 mg/dL — ABNORMAL HIGH (ref 8–23)
CO2: 26 mmol/L (ref 22–32)
Calcium: 8.5 mg/dL — ABNORMAL LOW (ref 8.9–10.3)
Chloride: 95 mmol/L — ABNORMAL LOW (ref 98–111)
Creatinine, Ser: 1.57 mg/dL — ABNORMAL HIGH (ref 0.44–1.00)
GFR, Estimated: 34 mL/min — ABNORMAL LOW (ref >60)
Glucose, Bld: 238 mg/dL — ABNORMAL HIGH (ref 70–99)
Potassium: 3.7 mmol/L (ref 3.5–5.1)
Sodium: 133 mmol/L — ABNORMAL LOW (ref 135–145)

## 2023-05-23 LAB — BRAIN NATRIURETIC PEPTIDE: B Natriuretic Peptide: 89.8 pg/mL (ref 0.0–100.0)

## 2023-05-24 ENCOUNTER — Ambulatory Visit: Payer: Self-pay

## 2023-05-24 ENCOUNTER — Ambulatory Visit: Payer: Medicare Other | Attending: Cardiology | Admitting: Cardiology

## 2023-05-24 VITALS — BP 118/49 | HR 86 | Wt 247.0 lb

## 2023-05-24 DIAGNOSIS — N183 Chronic kidney disease, stage 3 unspecified: Secondary | ICD-10-CM

## 2023-05-24 DIAGNOSIS — I10 Essential (primary) hypertension: Secondary | ICD-10-CM

## 2023-05-24 DIAGNOSIS — R5381 Other malaise: Secondary | ICD-10-CM | POA: Diagnosis not present

## 2023-05-24 DIAGNOSIS — I480 Paroxysmal atrial fibrillation: Secondary | ICD-10-CM

## 2023-05-24 DIAGNOSIS — I5032 Chronic diastolic (congestive) heart failure: Secondary | ICD-10-CM | POA: Diagnosis not present

## 2023-05-24 DIAGNOSIS — E1122 Type 2 diabetes mellitus with diabetic chronic kidney disease: Secondary | ICD-10-CM

## 2023-05-24 NOTE — Progress Notes (Signed)
Advanced Heart Failure Clinic Note   Referring Physician: recent admit PCP: Sherlene Shams, MD (last seen 06/24) Cardiologist: [name], MD (last seen 09/24)  HPI:  Denise Sharp is a 77 y/o female with a history of HFpEF, CAD status post PCI to the LAD, persistent atrial fibrillation, type 2 diabetes, CKD and morbid obesity presenting today for follow up.  Admitted 03/28/23 due to 30 pound weight gain requiring IV diuresis.  At that time she was taking Lasix 20 mg once daily to once every other day.  She was admitted to [facility] where echocardiogram demonstrated preserved EF with grade 2 diastolic dysfunction.  She was diuresed 4 L through admission and transition to torsemide 20 mg daily at discharge.  Throughout admission she maintained normal sinus rhythm.  She did have rectal bleeding that was secondary to hemorrhoids.  Echo 03/25/12: EF 55-65% with mild AR Echo 08/21/17: EF 65-70% with Grade II DD and mildly elevated PA pressure of 33 mmHg Echo 12/30/19: EF 65-70% with Grade II DD Echo 03/29/23: EF 60-65% with mild LVH, Grade II DD  RHC/ LHC 06/04/22: Mild-moderate, non-obstructive coronary artery disease, similar to prior catheterization in 08/2020. Widely patent ostial LAD stent. Patent mid LAD stent with minimal in stent restenosis (~10% narrowing). Upper normal left and right heart filling pressures. Borderline elevated pulmonary artery pressure. Normal to mildly reduced cardiac output/index.  Since our last visit she is doing very well from a volume standpoint. She is now taking torsemide 40mg  to 60mg  depending on her weight. Her weight has has been stable around 241lbs-243lbs. She is also wearing the Zoll HF monitoring device. Her biggest issue today is a continued decline in strength and ability to perform ADLs. She reports feeling weaker every week and now having difficulty folding clothes, etc. No associated dyspnea or symptoms of decompensated HF.   Past Medical History:   Diagnosis Date   (HFpEF) heart failure with preserved ejection fraction (HCC)    a. 12/2019 Echo: EF 65-70%, Gr2 DD. No significant valvular dzs.   Abnormality of gait 03/25/2013   Adrenal mass, left (HCC)    Anginal pain (HCC)    Anxiety    Aortic atherosclerosis (HCC)    Arthritis    Asthma    CAD (coronary artery disease) with h/o Atypical Chest Pain    a. 04/1986 Cath ([facility]): nl cors, EF 65%; b. 07/2012 Cath [facility]): Diff minor irregs; c. 07/2016 MV [facility]): "Equivocal"; d. 08/2016 Cardiac CT Ca2+ score [facility]): Ca2+ 1548; e. 05/2019 MV: No ischemia. EF 75%; f. 12/2019 PCI: LM nl, LAD 65ost (3.5x12 Resolute DES), 56m (2.75x12 Resolute DES),LCX/RCA nl; g. 08/2020 Cath: patent LAD stents, RCA 40ost, elev RH pressures, EF >65%.   Cervical spinal stenosis 1994   due to trauma to back ([employer] accident), has intermittent paralysis and parasthesias   Cervicogenic headache 03/23/2014   CKD (chronic kidney disease), stage III (HCC)    Depression    Diverticulosis    Dizziness    a.) chronic   DJD (degenerative joint disease)    a. Chronic R shoulder pain   Dyspnea    Esophageal stenosis 09/2009   a.) transient outlet obstruction by food, cleared by EGD   Family history of adverse reaction to anesthesia    a.) daughter with (+) PONV   Gastric bypass status for obesity    GERD (gastroesophageal reflux disease)    Headache(784.0)    HLD (hyperlipidemia)    Hypertension    IBS (irritable bowel syndrome)  IDA (iron deficiency anemia)    a.) post 2 unit txfsn 2009, normal endo/colonoscopy by [facility]   Left bundle branch block (LBBB)    a.) Intermittently present - likely rate related.   NSVT (nonsustained ventricular tachycardia) (HCC) 11/15/2020   a.) Holter study 11/15/2020; 11 episodes of NSVT lasting up to 8 beats with a maximum rate of 210 bpm; 14 atrial runs lasting up to 18 beats with a rate of up to 250 bpm.  Some atrial runs felt to be NSVT.   NSVT (nonsustained ventricular tachycardia)  (HCC)    a. 10/2020 [facility]: 11 runs of NSVT up to 8 beats.   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 10/2020 [facility]: 14 atrial runs up to 18 beats, max HR 250.   Restless leg syndrome    Rotator cuff arthropathy, right 08/13/2017   Syncope and collapse 03/12/2014   Type II diabetes mellitus (HCC)     Current Outpatient Medications  Medication Sig Dispense Refill   apixaban (ELIQUIS) 5 MG TABS tablet Take 1 tablet (5 mg total) by mouth 2 (two) times daily. 180 tablet 1   atorvastatin (LIPITOR) 80 MG tablet Take 1 tablet (80 mg total) by mouth daily. 90 tablet 1   budesonide-formoterol (SYMBICORT) 160-4.5 MCG/ACT inhaler Inhale 2 puffs into the lungs 2 (two) times daily. 1 each 6   butalbital-acetaminophen-caffeine (FIORICET) 50-325-40 MG tablet TAKE 1 TABLET BY MOUTH EVERY 6 HOURS AS NEEDED FOR HEADACHE. 60 tablet 2   Cholecalciferol (VITAMIN D3) 250 MCG (10000 UT) capsule Take 10,000 Units by mouth daily.     clotrimazole-betamethasone (LOTRISONE) cream Apply 1 application topically 2 (two) times daily as needed (irritation).     conjugated estrogens (PREMARIN) vaginal cream Apply one pea-sized amount around the opening of the urethra daily for 2 weeks, then 3 times weekly moving forward. 30 g 4   Continuous Blood Gluc Receiver (FREESTYLE LIBRE 2 READER) DEVI Use to check sugar at least TID 1 each 1   cyanocobalamin (VITAMIN B12) 1000 MCG/ML injection Inject 1 mL (1,000 mcg total) into the muscle every 30 (thirty) days. Inject 1 ml (1000 mcg ) IM weekly x 4,  Then monthly thereafter 3 mL 1   dapagliflozin propanediol (FARXIGA) 10 MG TABS tablet Take 1 tablet (10 mg total) by mouth daily. 90 tablet 3   dicyclomine (BENTYL) 20 MG tablet Take 1 tablet (20 mg total) by mouth 3 (three) times daily before meals. 180 tablet 2   DULoxetine (CYMBALTA) 60  MG capsule Take 1 capsule (60 mg total) by mouth daily. 90 capsule 3   ezetimibe (ZETIA) 10 MG tablet Take 1 tablet (10 mg total) by mouth daily. 90 tablet 3   fexofenadine (ALLEGRA) 180 MG tablet Take 180 mg by mouth as needed for allergies or rhinitis.     Finerenone (KERENDIA) 10 MG TABS Take 1 tablet (10 mg total) by mouth in the morning. 90 tablet 3   fluticasone (FLONASE) 50 MCG/ACT nasal spray Place 2 sprays into both nostrils daily. 16 g 6   Glucagon (GVOKE HYPOPEN 2-PACK) 0.5 MG/0.1ML SOAJ Inject 1 auto injector subcutaneously as needed for severe hypoglycemia. May repeat x 1 after 15 minutes if needed. 0.1 mL 2   glucose blood (ONETOUCH VERIO) test strip Use to check blood sugar twice daily. 100 each 1  hydrocortisone (ANUSOL-HC) 25 MG suppository Place 1 suppository (25 mg total) rectally 2 (two) times daily. 24 suppository 5   insulin aspart (NOVOLOG) 100 UNIT/ML injection Inject 10 Units into the skin 3 (three) times daily before meals.     insulin degludec (TRESIBA FLEXTOUCH) 100 UNIT/ML FlexTouch Pen Inject 12 Units into the skin daily. 15 mL 3   Insulin Pen Needle (DROPLET PEN NEEDLES) 31G X 5 MM MISC USE ONE NEEDLE SUBCUTANEOUSLY AS DIRECTED (REMOVE AND DISCARD NEEDLE IN SHARPS CONTAINER IMMEDIATELY AFTER USE) 100 each 1   lipase/protease/amylase (CREON) 36000 UNITS CPEP capsule Take 2 capsules (72,000 Units total) by mouth 3 (three) times daily before meals AND 1 capsule (36,000 Units total) with snacks. 300 capsule 9   loperamide (IMODIUM) 2 MG capsule Take 4-6 mg by mouth 3 (three) times daily before meals.     magnesium gluconate (MAGONATE) 500 MG tablet Take 1 tablet (500 mg total) by mouth daily. 90 tablet 1   metoprolol succinate (TOPROL-XL) 25 MG 24 hr tablet Take 0.5 tablets (12.5 mg total) by mouth daily. Take with or immediately following a meal. 45 tablet 3   nitroGLYCERIN (NITROSTAT) 0.4 MG SL tablet Place 1 tablet (0.4 mg total) under the tongue every 5 (five) minutes  as needed for chest pain. 25 tablet 5   nystatin (MYCOSTATIN/NYSTOP) powder Apply 1 g topically 4 (four) times daily as needed (rash).     ondansetron (ZOFRAN-ODT) 4 MG disintegrating tablet Take 1 tablet (4 mg total) by mouth every 8 (eight) hours as needed for nausea or vomiting. 30 tablet 0   OneTouch Delica Lancets 33G MISC Used to check blood sugar two times a day. 100 each 4   OVER THE COUNTER MEDICATION Apply 1 application  topically daily as needed (pain). [place] Gel topical pain reliever     pantoprazole (PROTONIX) 40 MG tablet Take 1 tablet (40 mg total) by mouth daily. 90 tablet 3   Polyethyl Glycol-Propyl Glycol (SYSTANE OP) Place 1 drop into both eyes 4 (four) times daily as needed (dry eyes).     potassium chloride 20 MEQ/15ML (10%) SOLN Take 15 mLs (20 mEq total) by mouth daily. 473 mL 3   pregabalin (LYRICA) 50 MG capsule Take 1 capsule (50 mg total) by mouth 2 (two) times daily. 180 capsule 1   SYRINGE-NEEDLE, DISP, 3 ML (LUER LOCK SAFETY SYRINGES) 25G X 1" 3 ML MISC Use to give b12 injections once monthly 12 each 0   tiZANidine (ZANAFLEX) 4 MG tablet Take 1 tablet (4 mg total) by mouth every 6 (six) hours as needed for muscle spasms. 30 tablet 0   torsemide (DEMADEX) 20 MG tablet Take 60 mg (3 tablets) daily 360 tablet 2   No current facility-administered medications for this visit.    Allergies  Allergen Reactions   Demeclocycline Hives   Erythromycin Nausea And Vomiting and Other (See Comments)    Severe irritable bowel   Flagyl [Metronidazole] Nausea And Vomiting and Other (See Comments)    Severe irritable bowel   Glucophage [Metformin Hcl] Nausea And Vomiting and Other (See Comments)    "Sick" "I won't take anything that has metformin in it"   Tetracyclines & Related Hives and Rash   Potassium Chloride Other (See Comments)    Cannot swallow potssium pills, able to tolerate liquid potassium   Tetracycline Other (See Comments)   Diovan [Valsartan] Nausea Only         Sulfa Antibiotics Other (See Comments) and Rash  As child   Xanax [Alprazolam] Other (See Comments)    Hyperactivity       Social History   Socioeconomic History   Marital status: Married    Spouse name: Denise Sharp    Number of children: 2   Years of education: College    Highest education level: Not on file  Occupational History    Employer: retired  Tobacco Use   Smoking status: Never    Passive exposure: Never   Smokeless tobacco: Never  Vaping Use   Vaping status: Never Used  Substance and Sexual Activity   Alcohol use: No   Drug use: Not Currently    Comment: prescribed valium   Sexual activity: Not Currently    Birth control/protection: Post-menopausal, Surgical  Other Topics Concern   Not on file  Social History Narrative   Patient lives at home with husband Denise Sharp.    Right Handed   Drinks caffeinated tea occasionally   One pet in house, dog   Social Determinants of Health   Financial Resource Strain: Low Risk  (09/15/2021)   Overall Financial Resource Strain (CARDIA)    Difficulty of Paying Living Expenses: Not hard at all  Food Insecurity: No Food Insecurity (04/05/2023)   Hunger Vital Sign    Worried About Running Out of Food in the Last Year: Never true    Ran Out of Food in the Last Year: Never true  Transportation Needs: No Transportation Needs (04/05/2023)   PRAPARE - Administrator, Civil Service (Medical): No    Lack of Transportation (Non-Medical): No  Physical Activity: Insufficiently Active (11/15/2020)   Exercise Vital Sign    Days of Exercise per Week: 2 days    Minutes of Exercise per Session: 40 min  Stress: Stress Concern Present (08/03/2020)   [employer] of Occupational Health - Occupational Stress Questionnaire    Feeling of Stress : Rather much  Social Connections: Not on file  Intimate Partner Violence: Not At Risk (03/28/2023)   Humiliation, Afraid, Rape, and Kick questionnaire    Fear of Current or Ex-Partner: No     Emotionally Abused: No    Physically Abused: No    Sexually Abused: No      Family History  Problem Relation Age of Onset   Heart disease Father    Hypertension Father    Prostate cancer Father    Stroke Father    Osteoporosis Father    Stroke Mother    Depression Mother    Headache Mother    Heart disease Mother    Thyroid disease Mother    Hypertension Mother    Diabetes Daughter    Heart disease Daughter    Hypertension Daughter    Hypertension Son       PHYSICAL EXAM: Vitals:   05/24/23 1412  BP: (!) 118/49  Pulse: 86  SpO2: 96%   GENERAL: Well nourished, well developed, and in no apparent distress at rest.  HEENT: Negative for arcus senilis or xanthelasma. There is no scleral icterus.  The mucous membranes are pink and moist.   NECK: Supple, No masses. Normal carotid upstrokes without bruits. No masses or thyromegaly.    CHEST: There are no chest wall deformities. There is no chest wall tenderness. Respirations are unlabored.  Lungs- CTA B/L CARDIAC:  JVP: 7 cm          Normal rate with regular rhythm. No murmurs, rubs or gallops.  Pulses are 2+ and symmetrical in upper and lower  extremities. No edema.  ABDOMEN: Soft, non-tender, non-distended. There are no masses or hepatomegaly. There are normal bowel sounds.  EXTREMITIES: Warm and well perfused with no cyanosis, clubbing.  LYMPHATIC: No axillary or supraclavicular lymphadenopathy.  NEUROLOGIC: Patient is oriented x3 with no focal or lateralizing neurologic deficits.  PSYCH: Patients affect is appropriate, there is no evidence of anxiety or depression.  SKIN: Warm and dry; no lesions or wounds.     ECG: 03/29/2023: Normal sinus rhythm, personally reviewed.  ASSESSMENT & PLAN:  1: Chronic heart failure with preserved ejection fraction- - suspect due to obesity, atrial fibrillation, vascular disease, hypertension, CKD - NYHA class III: symptoms 2/2 deconditioning. - Hypervolemic on exam; reports several  lb weight gain. Will plan on  - Echo 03/25/12: EF 55-65% with mild AR - Echo 08/21/17: EF 65-70% with Grade II DD and mildly elevated PA pressure of 33 mmHg - Echo 12/30/19: EF 65-70% with Grade II DD - Echo 03/29/23: EF 60-65% with mild LVH, Grade II DD - Continue fineronone 10mg  daily.  - Currently taking torsemide 60mg  on most days; monitoring weights daily & wearing Zoll HF device. Euvolemic on exam. Most of her issues are related to deconditioning rather than her HFpEF. Would benefit greatly from a structured exercise program.   2: HTN- - well controlled.  - Reviewed labs from yesterday; sCr improving, 1.57.   3: PAF- - saw cardiology (End) 09/24 - continue toprol 25mg  daily - continue follow up with Dr. Okey Dupre  4: OSA- - saw pulmonology Craige Cotta) 08/24 - No issues.   5: Rectal bleeding- - saw GI Gerre Pebbles) 08/24.  - Following with GI with plan to undergo colonoscopy soon.  6: Iron deficiency anemia- - saw hematology Donneta Romberg) 09/24 - hemoglobin 04/01/23 was 12.6  7: DM- - saw endocrinology Lurlean Leyden) 09/24 - A1c 03/21/23 was 7.6%  8. CKD IIIB - With underlying T2DM, HFpEF will continue finerenone 10mg .  - sCr improving; now 1.57.   9. Deconditioning - Severely deconditioned due to weight, knee pain, HFpEF; would benefit from continued physical therapy. Discussed at length with patient today. Will make referral.   I spent 45 minutes caring for this patient today including face to face time, ordering and reviewing labs, reviewing records from Dr. Okey Dupre, seeing the patient, documenting in the record, and arranging follow ups.   Dorthula Nettles, DO 05/24/23

## 2023-05-24 NOTE — Patient Outreach (Signed)
  Care Coordination   05/24/2023 Name: NANDANA BIRCH MRN: [id number] DOB: 11/24/1945   Care Coordination Outreach Attempts:  Successful contact made with patient.  Patient states she is on her way to a doctors appointment and request call back on another day.   Follow Up Plan:  Additional outreach attempts will be made to offer the patient care coordination information and services.   Encounter Outcome:  No Answer   Care Coordination Interventions:  No, not indicated    George Ina RN,BSN,CCM [facility]  [facility], [facility] coordinator / Case Manager Phone: [phone]

## 2023-05-24 NOTE — Patient Instructions (Signed)
   Referrals: You have been referred to cardiac rehab   Special Instructions // Education:  Do the following things EVERYDAY: Weigh yourself in the morning before breakfast. Write it down and keep it in a log. Take your medicines as prescribed Eat low salt foods--Limit salt (sodium) to 2000 mg per day.  Stay as active as you can everyday Limit all fluids for the day to less than 2 liters   Follow-Up in: You may follow up as needed.     If you have any questions or concerns before your next appointment please send [place] a message through [place] or call our office at [phone] Monday-Friday 8 am-5 pm.   If you have an urgent need after hours on the weekend please call your Primary Cardiologist or the Advanced Heart Failure Clinic in [place] at [phone].   At the Advanced Heart Failure Clinic, you and your health needs are our priority. We have a designated team specialized in the treatment of Heart Failure. This Care Team includes your primary Heart Failure Specialized Cardiologist (physician), Advanced Practice Providers (APPs- Physician Assistants and Nurse Practitioners), and Pharmacist who all work together to provide you with the care you need, when you need it.   You may see any of the following providers on your designated Care Team at your next follow up:  Dr. Arvilla Meres Dr. Marca Ancona Dr. Dorthula Nettles Dr. Theresia Bough Tonye Becket, NP Robbie Lis, [place] [address] [place], [place] Brynda Peon, NP [place] Lee, NP Clarisa Kindred, NP Enos Fling, PharmD

## 2023-05-28 ENCOUNTER — Other Ambulatory Visit: Payer: Self-pay | Admitting: *Deleted

## 2023-05-28 DIAGNOSIS — I5022 Chronic systolic (congestive) heart failure: Secondary | ICD-10-CM

## 2023-05-29 ENCOUNTER — Ambulatory Visit: Payer: Self-pay

## 2023-05-29 NOTE — Patient Outreach (Signed)
  Care Coordination   05/29/2023 Name: Denise Sharp MRN: [id number] DOB: [birth year]   Care Coordination Outreach Attempts:  An unsuccessful telephone outreach was attempted for a scheduled appointment today. Attempted to reach patient at listed mobile number x 2.  Message states call cannot be completed as dialed.  Attempted to reach patient at listed home number.  HIPAA compliant message left with return call phone number.   Follow Up Plan:  Additional outreach attempts will be made to offer the patient care coordination information and services.   Encounter Outcome:  No Answer   Care Coordination Interventions:  No, not indicated    George Ina RN,BSN,CCM [facility]  [facility], [facility] coordinator / Case Manager Phone: [phone]

## 2023-06-03 DIAGNOSIS — E1122 Type 2 diabetes mellitus with diabetic chronic kidney disease: Secondary | ICD-10-CM | POA: Diagnosis not present

## 2023-06-03 DIAGNOSIS — I130 Hypertensive heart and chronic kidney disease with heart failure and stage 1 through stage 4 chronic kidney disease, or unspecified chronic kidney disease: Secondary | ICD-10-CM | POA: Diagnosis not present

## 2023-06-03 DIAGNOSIS — I5033 Acute on chronic diastolic (congestive) heart failure: Secondary | ICD-10-CM | POA: Diagnosis not present

## 2023-06-03 DIAGNOSIS — I4819 Other persistent atrial fibrillation: Secondary | ICD-10-CM | POA: Diagnosis not present

## 2023-06-03 DIAGNOSIS — N183 Chronic kidney disease, stage 3 unspecified: Secondary | ICD-10-CM | POA: Diagnosis not present

## 2023-06-03 DIAGNOSIS — I2510 Atherosclerotic heart disease of native coronary artery without angina pectoris: Secondary | ICD-10-CM | POA: Diagnosis not present

## 2023-06-05 ENCOUNTER — Ambulatory Visit: Payer: Medicare Other | Admitting: Internal Medicine

## 2023-06-06 DIAGNOSIS — I5033 Acute on chronic diastolic (congestive) heart failure: Secondary | ICD-10-CM | POA: Diagnosis not present

## 2023-06-17 ENCOUNTER — Encounter: Payer: Self-pay | Admitting: Medical

## 2023-06-17 ENCOUNTER — Ambulatory Visit: Payer: Medicare Other | Attending: Medical | Admitting: Medical

## 2023-06-17 VITALS — BP 90/60 | HR 75 | Ht 64.5 in | Wt 246.5 lb

## 2023-06-17 DIAGNOSIS — G4733 Obstructive sleep apnea (adult) (pediatric): Secondary | ICD-10-CM | POA: Diagnosis not present

## 2023-06-17 DIAGNOSIS — I5032 Chronic diastolic (congestive) heart failure: Secondary | ICD-10-CM | POA: Insufficient documentation

## 2023-06-17 DIAGNOSIS — I25118 Atherosclerotic heart disease of native coronary artery with other forms of angina pectoris: Secondary | ICD-10-CM | POA: Insufficient documentation

## 2023-06-17 DIAGNOSIS — I480 Paroxysmal atrial fibrillation: Secondary | ICD-10-CM | POA: Diagnosis not present

## 2023-06-17 DIAGNOSIS — I10 Essential (primary) hypertension: Secondary | ICD-10-CM | POA: Diagnosis not present

## 2023-06-17 MED ORDER — TORSEMIDE 20 MG PO TABS: ORAL_TABLET | ORAL | 1 refills | Status: DC

## 2023-06-17 NOTE — Patient Instructions (Signed)
Medication Instructions:  Your physician recommends the following medication changes.  STOP TAKING: Metoprolol  *If you need a refill on your cardiac medications before your next appointment, please call your pharmacy*   Lab Work: No labs ordered today    Testing/Procedures: No test ordered today    Follow-Up: At [facility], you and your health needs are our priority.  As part of our continuing mission to provide you with exceptional heart care, we have created designated Provider Care Teams.  These Care Teams include your primary Cardiologist (physician) and Advanced Practice Providers (APPs -  Physician Assistants and Nurse Practitioners) who all work together to provide you with the care you need, when you need it.  We recommend signing up for the patient portal called "MyChart".  Sign up information is provided on this After Visit Summary.  MyChart is used to connect with patients for Virtual Visits (Telemedicine).  Patients are able to view lab/test results, encounter notes, upcoming appointments, etc.  Non-urgent messages can be sent to your provider as well.   To learn more about what you can do with MyChart, go to [email or web address].    Your next appointment:   4 month(s)  Provider:   You may see [name], MD or one of the following Advanced Practice Providers on your designated Care Team:   Nicolasa Ducking, NP Eula Listen, PA-C Cadence Fransico Michael, PA-C Charlsie Quest, NP Carlos Levering, NP

## 2023-06-17 NOTE — Progress Notes (Signed)
Cardiology Office Note:    Date:  06/17/2023   ID:  Denise Sharp, DOB Aug 08, 1945, MRN [id number]  PCP:  Sherlene Shams, MD  [facility] Cardiologist:  [name], MD  [facility] Electrophysiologist:  None   Referring MD: Sherlene Shams, MD   Chief Complaint: pre-op evaluation  History of Present Illness:    Denise Sharp is a 77 y.o. female with a hx of HFpEF, CAD s/p PCI to the LAD, persistent Afib, DM2, CKD, OSA on CPAP and morbid obesity who presents for follow-up.   Echo 03/25/12: EF 55-65% with mild AR Echo 08/21/17: EF 65-70% with Grade II DD and mildly elevated PA pressure of 33 mmHg Echo 12/30/19: EF 65-70% with Grade II DD Echo 03/29/23: EF 60-65% with mild LVH, Grade II DD   RHC/ LHC 06/04/22: Mild-moderate, non-obstructive coronary artery disease, similar to prior catheterization in 08/2020. Widely patent ostial LAD stent. Patent mid LAD stent with minimal in stent restenosis (~10% narrowing). Upper normal left and right heart filling pressures. Borderline elevated pulmonary artery pressure. Normal to mildly reduced cardiac output/index.  The patient was admitted in September 2024 due to 30 pound weight gain requiring IV diuresis at that time she was taking Lasix 20 mg once daily.  She was admitted to [facility] where echo showed preserved EF with grade 2 diastolic dysfunction.  She was diuresed 4 L and transition to torsemide 20 mg daily.  Throughout admission she maintained normal sinus rhythm.  She did have some rectal bleeding that was secondary to hemorrhoids.  She is needing a colonoscopy.  Patient has been followed by advanced heart failure team.  She was last seen May 24, 2023 reporting stable weights 241 to 243 pounds.  She was wearing ZOLL heart failure monitoring device.  She was taking torsemide 60 mg most days.  Today, the patient reports persistent chronic fatigue. She has shortness of breath that is unchanged. She denies chest pain. She has  unchanged lower leg edema. She takes torsemide and [place]. Weights have been stable.she is wearing the HF Zoll device. Repeat BP 100/60. She was bleeding in her rectum, but has not had any further bleeding. Most recent CBC 13.5. BNP 89. BMET stable. Can hold Eliquis prior to procedure 1-2 days.  Past Medical History:  Diagnosis Date   (HFpEF) heart failure with preserved ejection fraction (HCC)    a. 12/2019 Echo: EF 65-70%, Gr2 DD. No significant valvular dzs.   Abnormality of gait 03/25/2013   Adrenal mass, left (HCC)    Anginal pain (HCC)    Anxiety    Aortic atherosclerosis (HCC)    Arthritis    Asthma    CAD (coronary artery disease) with h/o Atypical Chest Pain    a. 04/1986 Cath ([facility]): nl cors, EF 65%; b. 07/2012 Cath [facility]): Diff minor irregs; c. 07/2016 MV [facility]): "Equivocal"; d. 08/2016 Cardiac CT Ca2+ score [facility]): Ca2+ 1548; e. 05/2019 MV: No ischemia. EF 75%; f. 12/2019 PCI: LM nl, LAD 65ost (3.5x12 Resolute DES), 27m (2.75x12 Resolute DES),LCX/RCA nl; g. 08/2020 Cath: patent LAD stents, RCA 40ost, elev RH pressures, EF >65%.   Cervical spinal stenosis 1994   due to trauma to back ([employer] accident), has intermittent paralysis and parasthesias   Cervicogenic headache 03/23/2014   CKD (chronic kidney disease), stage III (HCC)    Depression    Diverticulosis    Dizziness    a.) chronic   DJD (degenerative joint disease)    a. Chronic R shoulder  pain   Dyspnea    Esophageal stenosis 09/2009   a.) transient outlet obstruction by food, cleared by EGD   Family history of adverse reaction to anesthesia    a.) daughter with (+) PONV   Gastric bypass status for obesity    GERD (gastroesophageal reflux disease)    Headache(784.0)    HLD (hyperlipidemia)    Hypertension    IBS (irritable bowel syndrome)    IDA (iron deficiency anemia)    a.) post 2 unit txfsn 2009, normal endo/colonoscopy by [facility]   Left bundle branch block (LBBB)    a.) Intermittently present - likely rate  related.   NSVT (nonsustained ventricular tachycardia) (HCC) 11/15/2020   a.) Holter study 11/15/2020; 11 episodes of NSVT lasting up to 8 beats with a maximum rate of 210 bpm; 14 atrial runs lasting up to 18 beats with a rate of up to 250 bpm.  Some atrial runs felt to be NSVT.   NSVT (nonsustained ventricular tachycardia) (HCC)    a. 10/2020 Zio: 11 runs of NSVT up to 8 beats.   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 10/2020 Zio: 14 atrial runs up to 18 beats, max HR 250.   Restless leg syndrome    Rotator cuff arthropathy, right 08/13/2017   Syncope and collapse 03/12/2014   Type II diabetes mellitus (HCC)     Past Surgical History:  Procedure Laterality Date   APPENDECTOMY     BACK SURGERY     CARDIOVERSION N/A 10/30/2022   Procedure: CARDIOVERSION;  Surgeon: [name], MD;  Location: [facility] ORS;  Service: Cardiovascular;  Laterality: N/A;   CHOLECYSTECTOMY N/A 1976   COLONOSCOPY     CORONARY ANGIOPLASTY     CORONARY STENT INTERVENTION N/A 01/04/2020   Procedure: CORONARY STENT INTERVENTION;  Surgeon: Iran Ouch, MD;  Location: [facility] INVASIVE CV LAB;  Service: Cardiovascular;  Laterality: N/A;  LAD    DIAPHRAGMATIC HERNIA REPAIR  2015   ESOPHAGEAL DILATION     multiple   ESOPHAGOGASTRODUODENOSCOPY (EGD) WITH PROPOFOL N/A 05/11/2021   Procedure: ESOPHAGOGASTRODUODENOSCOPY (EGD) WITH PROPOFOL;  Surgeon: [name], MD;  Location: [facility] ENDOSCOPY;  Service: Endoscopy;  Laterality: N/A;   ESOPHAGOGASTRODUODENOSCOPY (EGD) WITH PROPOFOL N/A 06/13/2021   Procedure: ESOPHAGOGASTRODUODENOSCOPY (EGD) WITH PROPOFOL;  Surgeon: [name], MD;  Location: [facility] ENDOSCOPY;  Service: Endoscopy;  Laterality: N/A;   ESOPHAGOGASTRODUODENOSCOPY (EGD) WITH PROPOFOL N/A 02/28/2023   Procedure: ESOPHAGOGASTRODUODENOSCOPY (EGD) WITH  PROPOFOL;  Surgeon: [name], MD;  Location: [facility] ENDOSCOPY;  Service: Endoscopy;  Laterality: N/A;   EYE SURGERY     GASTRIC BYPASS  2000, 2005   Dr. Arlana Pouch IMPLANT PLACEMENT  10/2011   Jeannette How IMPLANT PLACEMENT N/A 09/18/2021   Procedure: Leane Platt IMPLANT FIRST STAGE;  Surgeon: Alfredo Martinez, MD;  Location: [facility] ORS;  Service: Urology;  Laterality: N/A;   INTERSTIM IMPLANT PLACEMENT N/A 09/18/2021   Procedure: Leane Platt IMPLANT SECOND STAGE WITH IMPEDENCE CHECK;  Surgeon: Alfredo Martinez, MD;  Location: [facility] ORS;  Service: Urology;  Laterality: N/A;   INTERSTIM IMPLANT REMOVAL N/A 09/18/2021   Procedure: REMOVAL OF INTERSTIM IMPLANT;  Surgeon: Alfredo Martinez, MD;  Location: [facility] ORS;  Service: Urology;  Laterality: N/A;   LEFT HEART CATH AND CORONARY ANGIOGRAPHY N/A 04/1986   LEFT HEART CATH AND CORONARY ANGIOGRAPHY Left 07/24/2012  LEFT HEART CATH AND CORS/GRAFTS ANGIOGRAPHY N/A 12/31/2019   Procedure: LEFT HEART CATH AND CORS/GRAFTS ANGIOGRAPHY;  Surgeon: Antonieta Iba, MD;  Location: [facility] INVASIVE CV LAB;  Service: Cardiovascular;  Laterality: N/A;   PANNICULECTOMY N/A 06/16/2019   Procedure: PANNICULECTOMY;  Surgeon: Allena Napoleon, MD;  Location: [facility] OR;  Service: Plastics;  Laterality: N/A;  3 hours, please   REVERSE SHOULDER ARTHROPLASTY Right 08/13/2017   Procedure: REVERSE RIGHT SHOULDER ARTHROPLASTY;  Surgeon: Teryl Lucy, MD;  Location: [facility] OR;  Service: Orthopedics;  Laterality: Right;   RIGHT/LEFT HEART CATH AND CORONARY ANGIOGRAPHY N/A 09/06/2020   Procedure: RIGHT/LEFT HEART CATH AND CORONARY ANGIOGRAPHY;  Surgeon: [name], MD;  Location: [facility] INVASIVE CV LAB;  Service: Cardiovascular;  Laterality: N/A;   RIGHT/LEFT HEART CATH AND CORONARY ANGIOGRAPHY N/A 06/04/2022   Procedure: RIGHT/LEFT HEART CATH AND CORONARY ANGIOGRAPHY;  Surgeon: [name], MD;  Location: [facility]ASIVE CV LAB;  Service: Cardiovascular;   Laterality: N/A;   ROTATOR CUFF REPAIR Right    SPINE SURGERY  1995   [place]   TOOTH EXTRACTION  11/13/2021   TOTAL ABDOMINAL HYSTERECTOMY W/ BILATERAL SALPINGOOPHORECTOMY  1974   TOTAL KNEE ARTHROPLASTY Bilateral 2007   Procedure: TOTAL KNEE ARTHROPLASTY; Surgeons: Gerrit Heck, MD and [name], MD   UMBILICAL HERNIA REPAIR  08/11/2015    Current Medications: Current Meds  Medication Sig   apixaban (ELIQUIS) 5 MG TABS tablet Take 1 tablet (5 mg total) by mouth 2 (two) times daily.   atorvastatin (LIPITOR) 80 MG tablet Take 1 tablet (80 mg total) by mouth daily.   budesonide-formoterol (SYMBICORT) 160-4.5 MCG/ACT inhaler Inhale 2 puffs into the lungs 2 (two) times daily.   butalbital-acetaminophen-caffeine (FIORICET) 50-325-40 MG tablet TAKE 1 TABLET BY MOUTH EVERY 6 HOURS AS NEEDED FOR HEADACHE.   Cholecalciferol (VITAMIN D3) 250 MCG (10000 UT) capsule Take 10,000 Units by mouth daily.   clotrimazole-betamethasone (LOTRISONE) cream Apply 1 application topically 2 (two) times daily as needed (irritation).   conjugated estrogens (PREMARIN) vaginal cream Apply one pea-sized amount around the opening of the urethra daily for 2 weeks, then 3 times weekly moving forward.   Continuous Blood Gluc Receiver (FREESTYLE LIBRE 2 READER) DEVI Use to check sugar at least TID   cyanocobalamin (VITAMIN B12) 1000 MCG/ML injection Inject 1 mL (1,000 mcg total) into the muscle every 30 (thirty) days. Inject 1 ml (1000 mcg ) IM weekly x 4,  Then monthly thereafter   dapagliflozin propanediol (FARXIGA) 10 MG TABS tablet Take 1 tablet (10 mg total) by mouth daily.   dicyclomine (BENTYL) 20 MG tablet Take 1 tablet (20 mg total) by mouth 3 (three) times daily before meals.   DULoxetine (CYMBALTA) 60 MG capsule Take 1 capsule (60 mg total) by mouth daily.   ezetimibe (ZETIA) 10 MG tablet Take 1 tablet (10 mg total) by mouth daily.   fexofenadine (ALLEGRA) 180 MG tablet Take 180 mg by mouth as needed for allergies or  rhinitis.   Finerenone (KERENDIA) 10 MG TABS Take 1 tablet (10 mg total) by mouth in the morning.   fluticasone (FLONASE) 50 MCG/ACT nasal spray Place 2 sprays into both nostrils daily.   Glucagon (GVOKE HYPOPEN 2-PACK) 0.5 MG/0.1ML SOAJ Inject 1 auto injector subcutaneously as needed for severe hypoglycemia. May repeat x 1 after 15 minutes if needed.   glucose blood (ONETOUCH VERIO) test strip Use to check blood sugar twice daily.   hydrocortisone (ANUSOL-HC) 25 MG suppository Place 1 suppository (25 mg total) rectally 2 (two)  times daily.   insulin aspart (NOVOLOG) 100 UNIT/ML injection Inject 10 Units into the skin 3 (three) times daily before meals.   insulin degludec (TRESIBA FLEXTOUCH) 100 UNIT/ML FlexTouch Pen Inject 12 Units into the skin daily.   Insulin Pen Needle (DROPLET PEN NEEDLES) 31G X 5 MM MISC USE ONE NEEDLE SUBCUTANEOUSLY AS DIRECTED (REMOVE AND DISCARD NEEDLE IN SHARPS CONTAINER IMMEDIATELY AFTER USE)   lipase/protease/amylase (CREON) 36000 UNITS CPEP capsule Take 2 capsules (72,000 Units total) by mouth 3 (three) times daily before meals AND 1 capsule (36,000 Units total) with snacks.   loperamide (IMODIUM) 2 MG capsule Take 4-6 mg by mouth 3 (three) times daily before meals.   nitroGLYCERIN (NITROSTAT) 0.4 MG SL tablet Place 1 tablet (0.4 mg total) under the tongue every 5 (five) minutes as needed for chest pain.   nystatin (MYCOSTATIN/NYSTOP) powder Apply 1 g topically 4 (four) times daily as needed (rash).   ondansetron (ZOFRAN-ODT) 4 MG disintegrating tablet Take 1 tablet (4 mg total) by mouth every 8 (eight) hours as needed for nausea or vomiting.   OneTouch Delica Lancets 33G MISC Used to check blood sugar two times a day.   OVER THE COUNTER MEDICATION Apply 1 application  topically daily as needed (pain). [place] Gel topical pain reliever   pantoprazole (PROTONIX) 40 MG tablet Take 1 tablet (40 mg total) by mouth daily.   Polyethyl Glycol-Propyl Glycol (SYSTANE OP) Place 1  drop into both eyes 4 (four) times daily as needed (dry eyes).   potassium chloride 20 MEQ/15ML (10%) SOLN Take 15 mLs (20 mEq total) by mouth daily.   pregabalin (LYRICA) 50 MG capsule Take 1 capsule (50 mg total) by mouth 2 (two) times daily.   tiZANidine (ZANAFLEX) 4 MG tablet Take 1 tablet (4 mg total) by mouth every 6 (six) hours as needed for muscle spasms.   [DISCONTINUED] metoprolol succinate (TOPROL-XL) 25 MG 24 hr tablet Take 0.5 tablets (12.5 mg total) by mouth daily. Take with or immediately following a meal.   [DISCONTINUED] torsemide (DEMADEX) 20 MG tablet Take 60 mg (3 tablets) daily     Allergies:   Demeclocycline, Erythromycin, Flagyl [metronidazole], Glucophage [metformin hcl], Tetracyclines & related, Potassium chloride, Tetracycline, Diovan [valsartan], Sulfa antibiotics, and Xanax [alprazolam]   Social History   Socioeconomic History   Marital status: Married    Spouse name: Alinda Money    Number of children: 2   Years of education: College    Highest education level: Not on file  Occupational History    Employer: retired  Tobacco Use   Smoking status: Never    Passive exposure: Never   Smokeless tobacco: Never  Vaping Use   Vaping status: Never Used  Substance and Sexual Activity   Alcohol use: No   Drug use: Not Currently    Comment: prescribed valium   Sexual activity: Not Currently    Birth control/protection: Post-menopausal, Surgical  Other Topics Concern   Not on file  Social History Narrative   Patient lives at home with husband Alinda Money.    Right Handed   Drinks caffeinated tea occasionally   One pet in house, dog   Social Determinants of Health   Financial Resource Strain: Low Risk  (09/15/2021)   Overall Financial Resource Strain (CARDIA)    Difficulty of Paying Living Expenses: Not hard at all  Food Insecurity: No Food Insecurity (04/05/2023)   Hunger Vital Sign    Worried About Running Out of Food in the Last Year: Never true  Ran Out of Food in  the Last Year: Never true  Transportation Needs: No Transportation Needs (04/05/2023)   PRAPARE - Administrator, Civil Service (Medical): No    Lack of Transportation (Non-Medical): No  Physical Activity: Insufficiently Active (11/15/2020)   Exercise Vital Sign    Days of Exercise per Week: 2 days    Minutes of Exercise per Session: 40 min  Stress: Stress Concern Present (08/03/2020)   [employer] of Occupational Health - Occupational Stress Questionnaire    Feeling of Stress : Rather much  Social Connections: Not on file     Family History: The patient's family history includes Depression in her mother; Diabetes in her daughter; Headache in her mother; Heart disease in her daughter, father, and mother; Hypertension in her daughter, father, mother, and son; Osteoporosis in her father; Prostate cancer in her father; Stroke in her father and mother; Thyroid disease in her mother.  ROS:   Please see the history of present illness.     All other systems reviewed and are negative.  EKGs/Labs/Other Studies Reviewed:    The following studies were reviewed today:  Echo 03/2023  1. Left ventricular ejection fraction, by estimation, is 60 to 65%. The  left ventricle has normal function. The left ventricle has no regional  wall motion abnormalities. There is mild left ventricular hypertrophy.  Left ventricular diastolic parameters  are consistent with Grade II diastolic dysfunction (pseudonormalization).   2. Right ventricular systolic function is normal. The right ventricular  size is normal.   3. Left atrial size was mild to moderately dilated.   4. Right atrial size was mildly dilated.   5. The mitral valve is degenerative. No evidence of mitral valve  regurgitation.   6. The aortic valve was not well visualized. Aortic valve regurgitation  is mild.   R/L heart cath 05/2022  Conclusions: Mild-moderate, non-obstructive coronary artery disease, similar to prior  catheterization in 08/2020. Widely patent ostial LAD stent. Patent mid LAD stent with minimal in stent restenosis (~10% narrowing). Upper normal left and right heart filling pressures. Borderline elevated pulmonary artery pressure. Normal to mildly reduced cardiac output/index.   Recommendations: No obvious findings to explain the patient's progressive symptoms and NSVT on recent event monitor. Restart metoprolol succinate 25 mg daily. Aggressive secondary prevention of coronary artery disease and medical therapy for chronic HFpEF.   [name], MD [facility]     EKG:  EKG is ordered today.  The ekg ordered today demonstrates NSR 75bpm, nonspecific T wave changes  Recent Labs: 09/27/2022: TSH 2.887 03/28/2023: ALT 16 04/08/2023: Hemoglobin 14.2; Platelets 313 04/29/2023: Magnesium 1.7 05/23/2023: B Natriuretic Peptide 89.8; BUN 29; Creatinine, Ser 1.57; Potassium 3.7; Sodium 133  Recent Lipid Panel    Component Value Date/Time   CHOL 103 10/23/2022 1324   CHOL 155 02/27/2014 0420   TRIG 98 10/23/2022 1324   TRIG 121 02/27/2014 0420   HDL 38 (L) 10/23/2022 1324   HDL 43 02/27/2014 0420   CHOLHDL 2.7 10/23/2022 1324   VLDL 20 10/23/2022 1324   VLDL 24 02/27/2014 0420   LDLCALC 45 10/23/2022 1324   LDLCALC 114 (H) 07/13/2022 1529   LDLCALC 88 02/27/2014 0420   LDLDIRECT 126 (H) 07/13/2022 1529     Physical Exam:    VS:  BP 90/60 (BP Location: Left Arm, Patient Position: Sitting, Cuff Size: Large)   Pulse 75   Ht 5' 4.5" (1.638 m)   Wt 246 lb 8 oz (111.8  kg)   SpO2 97%   BMI 41.66 kg/m     Wt Readings from Last 3 Encounters:  06/17/23 246 lb 8 oz (111.8 kg)  05/24/23 247 lb (112 kg)  05/15/23 249 lb 6.4 oz (113.1 kg)     GEN:  Well nourished, well developed in no acute distress HEENT: Normal NECK: No JVD; No carotid bruits LYMPHATICS: No lymphadenopathy CARDIAC: RRR, no murmurs, rubs, gallops RESPIRATORY:  Clear to auscultation without rales, wheezing  or rhonchi  ABDOMEN: Soft, non-tender, non-distended MUSCULOSKELETAL:  No edema; No deformity  SKIN: Warm and dry NEUROLOGIC:  Alert and oriented x 3 PSYCHIATRIC:  Normal affect   ASSESSMENT:    1. Chronic heart failure with preserved ejection fraction (HFpEF) (HCC)   2. Paroxysmal atrial fibrillation (HCC)   3. Essential hypertension   4. Coronary artery disease of native artery of native heart with stable angina pectoris (HCC)   5. OSA (obstructive sleep apnea)    PLAN:    In order of problems listed above:  Chronic heart failure with preserved EF She has chronic and unchanged SOB which is multifactorial due to obesity, A-fib, general deconditioning, OSA, iron deficiency anemia.  She reports unchanged lower leg edema.  Patient appears euvolemic on exam. Recent BNP was normal.  Most recent echo in September 2024 showed EF of 60 to 65%, mild LVH, grade 2 diastolic dysfunction.  Weights have been stable.  Continue Farxiga 10mg  daily, finerenone 10mg  daily, torsemide 60mg  daily with potassium supplement. She follows with advanced heart failure team. She has the Zoll HF device with follow-up next month.   HTN BP low today. Repeat BP 100/60. She reports dizziness and lightheadedness. I will stop Toprol for now, can re-evaluate need for this at follow-up.   CAD She denies chest pain, but has unchanged SOB. No plan for ischemic evaluation at this time.   Pafib Stop Toprol as above for hypotension. Continue Eliquis 5mg  BID for stroke ppx.   OSA She reports compliance with CPAP machine.   CKD stage 3 Continue Finerenone 10mg  daily   Pre-op cardiac evaluation Iron def anemia Rectal bleeding she denies active bleeding. Most recent Hgb 13.5. She is needing clearance for a colonoscopy. Per pharmacy, can hold Eliquis 1-2 prior to procedure. She is overall stable from a cardiac perspective. METS 3.63. According to RCRI she is Class IV risk at 15% risk of death, MI or cardiac arrest.    Disposition: Follow up in 4 month(s) with MD/APP    Signed, Simmie Camerer David Stall, PA-C  06/17/2023 3:42 PM    [facility]

## 2023-06-19 ENCOUNTER — Other Ambulatory Visit: Payer: Self-pay

## 2023-06-19 ENCOUNTER — Telehealth: Payer: Self-pay | Admitting: Internal Medicine

## 2023-06-19 DIAGNOSIS — I4819 Other persistent atrial fibrillation: Secondary | ICD-10-CM

## 2023-06-19 MED ORDER — APIXABAN 5 MG PO TABS: 5.0000 mg | ORAL_TABLET | Freq: Two times a day (BID) | ORAL | 1 refills | Status: DC

## 2023-06-19 NOTE — Telephone Encounter (Signed)
Prescription refill request for Eliquis received. Indication:afib Last office visit:12/24 Scr:1.57  11/24 Age: 77 Weight:111.8  kg  Prescription refilled

## 2023-06-19 NOTE — Telephone Encounter (Signed)
*  STAT* If patient is at the pharmacy, call can be transferred to refill team.   1. Which medications need to be refilled? (please list name of each medication and dose if known)   apixaban (ELIQUIS) 5 MG TABS tablet   2. Which pharmacy/location (including street and city if local pharmacy) is medication to be sent to? [facility] - [place], [place] - [address] Phone: [phone]  Fax: [phone]       Significant History/Details     3. Do they need a 30 day or 90 day supply? 90

## 2023-06-20 ENCOUNTER — Telehealth: Payer: Self-pay | Admitting: *Deleted

## 2023-06-20 NOTE — Progress Notes (Signed)
  Care Coordination Note  06/20/2023 Name: Denise Sharp MRN: [id number] DOB: 12-04-1945  Denise Sharp is a 77 y.o. year old female who is a primary care patient of Darrick Huntsman, Mar Daring, MD and is actively engaged with the care management team. I reached out to [name] by phone today to assist with re-scheduling a follow up visit with the RN Case Manager  Follow up plan: Unsuccessful telephone outreach attempt made. A HIPAA compliant phone message was left for the patient providing contact information and requesting a return call.   Burman Nieves, CCMA Care Coordination Care Guide Direct Dial: [phone]

## 2023-06-24 ENCOUNTER — Encounter: Payer: Self-pay | Admitting: Urology

## 2023-06-24 ENCOUNTER — Encounter: Payer: Self-pay | Admitting: Nurse Practitioner

## 2023-06-24 ENCOUNTER — Ambulatory Visit (INDEPENDENT_AMBULATORY_CARE_PROVIDER_SITE_OTHER): Payer: Medicare Other | Admitting: Nurse Practitioner

## 2023-06-24 ENCOUNTER — Ambulatory Visit: Payer: Medicare Other | Admitting: Urology

## 2023-06-24 VITALS — BP 98/63 | HR 77 | Ht 64.5 in | Wt 252.2 lb

## 2023-06-24 VITALS — BP 122/73 | HR 101 | Ht 64.0 in | Wt 252.0 lb

## 2023-06-24 DIAGNOSIS — N3946 Mixed incontinence: Secondary | ICD-10-CM | POA: Diagnosis not present

## 2023-06-24 DIAGNOSIS — Z794 Long term (current) use of insulin: Secondary | ICD-10-CM

## 2023-06-24 DIAGNOSIS — E1142 Type 2 diabetes mellitus with diabetic polyneuropathy: Secondary | ICD-10-CM

## 2023-06-24 DIAGNOSIS — E782 Mixed hyperlipidemia: Secondary | ICD-10-CM | POA: Diagnosis not present

## 2023-06-24 DIAGNOSIS — E1165 Type 2 diabetes mellitus with hyperglycemia: Secondary | ICD-10-CM | POA: Diagnosis not present

## 2023-06-24 DIAGNOSIS — I10 Essential (primary) hypertension: Secondary | ICD-10-CM

## 2023-06-24 DIAGNOSIS — E559 Vitamin D deficiency, unspecified: Secondary | ICD-10-CM | POA: Diagnosis not present

## 2023-06-24 LAB — URINALYSIS, COMPLETE
Bilirubin, UA: NEGATIVE
Ketones, UA: NEGATIVE
Leukocytes,UA: NEGATIVE
Nitrite, UA: NEGATIVE
Protein,UA: NEGATIVE
RBC, UA: NEGATIVE
Specific Gravity, UA: 1.015 (ref 1.005–1.030)
Urobilinogen, Ur: 0.2 mg/dL (ref 0.2–1.0)
pH, UA: 5.5 (ref 5.0–7.5)

## 2023-06-24 LAB — MICROSCOPIC EXAMINATION: Epithelial Cells (non renal): >10 /[HPF] — AB (ref 0–10)

## 2023-06-24 LAB — POCT GLYCOSYLATED HEMOGLOBIN (HGB A1C): Hemoglobin A1C: 9.5 % — AB (ref 4.0–5.6)

## 2023-06-24 MED ORDER — NOVOLOG FLEXPEN 100 UNIT/ML ~~LOC~~ SOPN: 10.0000 [IU] | PEN_INJECTOR | Freq: Three times a day (TID) | SUBCUTANEOUS | 3 refills | Status: DC

## 2023-06-24 MED ORDER — TRESIBA FLEXTOUCH 100 UNIT/ML ~~LOC~~ SOPN: 15.0000 [IU] | PEN_INJECTOR | Freq: Every day | SUBCUTANEOUS | 3 refills | Status: DC

## 2023-06-24 NOTE — Progress Notes (Signed)
  Care Coordination Note  06/24/2023 Name: Denise Sharp MRN: [id number] DOB: [birth year]  Denise Sharp is a 77 y.o. year old female who is a primary care patient of Darrick Huntsman, Mar Daring, MD and is actively engaged with the care management team. I reached out to [name] by phone today to assist with re-scheduling a follow up visit with the RN Case Manager  Follow up plan: We have been unable to make contact with the patient for follow up.   Burman Nieves, CCMA Care Coordination Care Guide Direct Dial: [phone]

## 2023-06-24 NOTE — Progress Notes (Signed)
Endocrinology Follow Up Note       06/24/2023, 11:53 AM   Subjective:    Patient ID: Denise Sharp, female    DOB: 10-09-1945.  Denise Sharp is being seen in follow up after being seen in consultation for management of currently uncontrolled symptomatic diabetes requested by  Sherlene Shams, MD.   Past Medical History:  Diagnosis Date   (HFpEF) heart failure with preserved ejection fraction (HCC)    a. 12/2019 Echo: EF 65-70%, Gr2 DD. No significant valvular dzs.   Abnormality of gait 03/25/2013   Adrenal mass, left (HCC)    Anginal pain (HCC)    Anxiety    Aortic atherosclerosis (HCC)    Arthritis    Asthma    CAD (coronary artery disease) with h/o Atypical Chest Pain    a. 04/1986 Cath ([facility]): nl cors, EF 65%; b. 07/2012 Cath [facility]): Diff minor irregs; c. 07/2016 MV [facility]): "Equivocal"; d. 08/2016 Cardiac CT Ca2+ score [facility]): Ca2+ 1548; e. 05/2019 MV: No ischemia. EF 75%; f. 12/2019 PCI: LM nl, LAD 65ost (3.5x12 Resolute DES), 41m (2.75x12 Resolute DES),LCX/RCA nl; g. 08/2020 Cath: patent LAD stents, RCA 40ost, elev RH pressures, EF >65%.   Cervical spinal stenosis 1994   due to trauma to back ([employer] accident), has intermittent paralysis and parasthesias   Cervicogenic headache 03/23/2014   CKD (chronic kidney disease), stage III (HCC)    Depression    Diverticulosis    Dizziness    a.) chronic   DJD (degenerative joint disease)    a. Chronic R shoulder pain   Dyspnea    Esophageal stenosis 09/2009   a.) transient outlet obstruction by food, cleared by EGD   Family history of adverse reaction to anesthesia    a.) daughter with (+) PONV   Gastric bypass status for obesity    GERD (gastroesophageal reflux disease)    Headache(784.0)    HLD (hyperlipidemia)    Hypertension    IBS (irritable bowel syndrome)    IDA (iron deficiency anemia)    a.) post 2 unit txfsn 2009, normal endo/colonoscopy by  [facility]   Left bundle branch block (LBBB)    a.) Intermittently present - likely rate related.   NSVT (nonsustained ventricular tachycardia) (HCC) 11/15/2020   a.) Holter study 11/15/2020; 11 episodes of NSVT lasting up to 8 beats with a maximum rate of 210 bpm; 14 atrial runs lasting up to 18 beats with a rate of up to 250 bpm.  Some atrial runs felt to be NSVT.   NSVT (nonsustained ventricular tachycardia) (HCC)    a. 10/2020 [facility]: 11 runs of NSVT up to 8 beats.   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 10/2020 [facility]: 14 atrial runs up to 18 beats, max HR 250.   Restless leg syndrome    Rotator cuff arthropathy, right 08/13/2017   Syncope and collapse 03/12/2014   Type II diabetes mellitus (HCC)     Past Surgical History:  Procedure Laterality Date   APPENDECTOMY  BACK SURGERY     CARDIOVERSION N/A 10/30/2022   Procedure: CARDIOVERSION;  Surgeon: [name], MD;  Location: [facility] ORS;  Service: Cardiovascular;  Laterality: N/A;   CHOLECYSTECTOMY N/A 1976   COLONOSCOPY     CORONARY ANGIOPLASTY     CORONARY STENT INTERVENTION N/A 01/04/2020   Procedure: CORONARY STENT INTERVENTION;  Surgeon: Iran Ouch, MD;  Location: [facility] INVASIVE CV LAB;  Service: Cardiovascular;  Laterality: N/A;  LAD    DIAPHRAGMATIC HERNIA REPAIR  2015   ESOPHAGEAL DILATION     multiple   ESOPHAGOGASTRODUODENOSCOPY (EGD) WITH PROPOFOL N/A 05/11/2021   Procedure: ESOPHAGOGASTRODUODENOSCOPY (EGD) WITH PROPOFOL;  Surgeon: [name], MD;  Location: [facility] ENDOSCOPY;  Service: Endoscopy;  Laterality: N/A;   ESOPHAGOGASTRODUODENOSCOPY (EGD) WITH PROPOFOL N/A 06/13/2021   Procedure: ESOPHAGOGASTRODUODENOSCOPY (EGD) WITH PROPOFOL;  Surgeon: [name], MD;  Location: [facility] ENDOSCOPY;  Service: Endoscopy;  Laterality: N/A;   ESOPHAGOGASTRODUODENOSCOPY (EGD)  WITH PROPOFOL N/A 02/28/2023   Procedure: ESOPHAGOGASTRODUODENOSCOPY (EGD) WITH PROPOFOL;  Surgeon: [name], MD;  Location: [facility];  Service: Endoscopy;  Laterality: N/A;   EYE SURGERY     GASTRIC BYPASS  2000, 2005   Dr. Arlana Pouch IMPLANT PLACEMENT  10/2011   Jeannette How IMPLANT PLACEMENT N/A 09/18/2021   Procedure: Leane Platt IMPLANT FIRST STAGE;  Surgeon: Alfredo Martinez, MD;  Location: [facility] ORS;  Service: Urology;  Laterality: N/A;   INTERSTIM IMPLANT PLACEMENT N/A 09/18/2021   Procedure: Leane Platt IMPLANT SECOND STAGE WITH IMPEDENCE CHECK;  Surgeon: Alfredo Martinez, MD;  Location: [facility];  Service: Urology;  Laterality: N/A;   INTERSTIM IMPLANT REMOVAL N/A 09/18/2021   Procedure: REMOVAL OF INTERSTIM IMPLANT;  Surgeon: Alfredo Martinez, MD;  Location: [facility];  Service: Urology;  Laterality: N/A;   LEFT HEART CATH AND CORONARY ANGIOGRAPHY N/A 04/1986   LEFT HEART CATH AND CORONARY ANGIOGRAPHY Left 07/24/2012   LEFT HEART CATH AND CORS/GRAFTS ANGIOGRAPHY N/A 12/31/2019   Procedure: LEFT HEART CATH AND CORS/GRAFTS ANGIOGRAPHY;  Surgeon: Antonieta Iba, MD;  Location: [facility];  Service: Cardiovascular;  Laterality: N/A;   PANNICULECTOMY N/A 06/16/2019   Procedure: PANNICULECTOMY;  Surgeon: Allena Napoleon, MD;  Location: [facility] OR;  Service: Plastics;  Laterality: N/A;  3 hours, please   REVERSE SHOULDER ARTHROPLASTY Right 08/13/2017   Procedure: REVERSE RIGHT SHOULDER ARTHROPLASTY;  Surgeon: Teryl Lucy, MD;  Location: [facility] OR;  Service: Orthopedics;  Laterality: Right;   RIGHT/LEFT HEART CATH AND CORONARY ANGIOGRAPHY N/A 09/06/2020   Procedure: RIGHT/LEFT HEART CATH AND CORONARY ANGIOGRAPHY;  Surgeon: [name], MD;  Location: [facility];  Service: Cardiovascular;  Laterality: N/A;   RIGHT/LEFT HEART CATH AND CORONARY ANGIOGRAPHY N/A 06/04/2022   Procedure: RIGHT/LEFT HEART CATH AND CORONARY ANGIOGRAPHY;  Surgeon: [name], MD;  Location: [facility];  Service: Cardiovascular;  Laterality: N/A;   ROTATOR CUFF REPAIR Right    SPINE SURGERY  1995   [place]   TOOTH EXTRACTION  11/13/2021   TOTAL ABDOMINAL HYSTERECTOMY W/ BILATERAL SALPINGOOPHORECTOMY  1974   TOTAL KNEE ARTHROPLASTY Bilateral 2007   Procedure: TOTAL KNEE ARTHROPLASTY; Surgeons: Gerrit Heck, MD and [name], MD   UMBILICAL HERNIA REPAIR  08/11/2015    Social History   Socioeconomic History   Marital status: Married    Spouse name: Alinda Money    Number of children: 2   Years of education: College    Highest education level: Not on file  Occupational History    Employer: retired  Tobacco  Use   Smoking status: Never    Passive exposure: Never   Smokeless tobacco: Never  Vaping Use   Vaping status: Never Used  Substance and Sexual Activity   Alcohol use: No   Drug use: Not Currently    Comment: prescribed valium   Sexual activity: Not Currently    Birth control/protection: Post-menopausal, Surgical  Other Topics Concern   Not on file  Social History Narrative   Patient lives at home with husband Alinda Money.    Right Handed   Drinks caffeinated tea occasionally   One pet in house, dog   Social Determinants of Health   Financial Resource Strain: Low Risk  (09/15/2021)   Overall Financial Resource Strain (CARDIA)    Difficulty of Paying Living Expenses: Not hard at all  Food Insecurity: No Food Insecurity (04/05/2023)   Hunger Vital Sign    Worried About Running Out of Food in the Last Year: Never true    Ran Out of Food in the Last Year: Never true  Transportation Needs: No Transportation Needs (04/05/2023)   PRAPARE - Administrator, Civil Service (Medical): No    Lack of Transportation (Non-Medical): No  Physical Activity: Insufficiently Active (11/15/2020)   Exercise Vital Sign    Days of Exercise per Week: 2 days    Minutes of Exercise per Session: 40 min  Stress: Stress Concern Present (08/03/2020)   [employer] - Occupational Stress Questionnaire    Feeling of Stress : Rather much  Social Connections: Not on file    Family History  Problem Relation Age of Onset   Heart disease Father    Hypertension Father    Prostate cancer Father    Stroke Father    Osteoporosis Father    Stroke Mother    Depression Mother    Headache Mother    Heart disease Mother    Thyroid disease Mother    Hypertension Mother    Diabetes Daughter    Heart disease Daughter    Hypertension Daughter    Hypertension Son     Outpatient Encounter Medications as of 06/24/2023  Medication Sig   atorvastatin (LIPITOR) 80 MG tablet Take 1 tablet (80 mg total) by mouth daily.   budesonide-formoterol (SYMBICORT) 160-4.5 MCG/ACT inhaler Inhale 2 puffs into the lungs 2 (two) times daily.   butalbital-acetaminophen-caffeine (FIORICET) 50-325-40 MG tablet TAKE 1 TABLET BY MOUTH EVERY 6 HOURS AS NEEDED FOR HEADACHE.   Cholecalciferol (VITAMIN D3) 250 MCG (10000 UT) capsule Take 10,000 Units by mouth daily.   clotrimazole-betamethasone (LOTRISONE) cream Apply 1 application topically 2 (two) times daily as needed (irritation).   conjugated estrogens (PREMARIN) vaginal cream Apply one pea-sized amount around the opening of the urethra daily for 2 weeks, then 3 times weekly moving forward.   Continuous Blood Gluc Receiver (FREESTYLE LIBRE 2 READER) DEVI Use to check sugar at least TID   cyanocobalamin (VITAMIN B12) 1000 MCG/ML injection Inject 1 mL (1,000 mcg total) into the muscle every 30 (thirty) days. Inject 1 ml (1000 mcg ) IM weekly x 4,  Then monthly thereafter   dapagliflozin propanediol (FARXIGA) 10 MG TABS tablet Take 1 tablet (10 mg total) by mouth daily.   dicyclomine (BENTYL) 20 MG tablet Take 1 tablet (20 mg total) by mouth 3 (three) times daily before meals.   DULoxetine (CYMBALTA) 60 MG capsule Take 1 capsule (60 mg total) by mouth daily.   ezetimibe (ZETIA) 10 MG tablet Take 1 tablet  (  10 mg total) by mouth daily.   fexofenadine (ALLEGRA) 180 MG tablet Take 180 mg by mouth as needed for allergies or rhinitis.   Finerenone (KERENDIA) 10 MG TABS Take 1 tablet (10 mg total) by mouth in the morning.   fluticasone (FLONASE) 50 MCG/ACT nasal spray Place 2 sprays into both nostrils daily.   Glucagon (GVOKE HYPOPEN 2-PACK) 0.5 MG/0.1ML SOAJ Inject 1 auto injector subcutaneously as needed for severe hypoglycemia. May repeat x 1 after 15 minutes if needed.   glucose blood (ONETOUCH VERIO) test strip Use to check blood sugar twice daily.   hydrocortisone (ANUSOL-HC) 25 MG suppository Place 1 suppository (25 mg total) rectally 2 (two) times daily.   insulin aspart (NOVOLOG FLEXPEN) 100 UNIT/ML FlexPen Inject 10-16 Units into the skin 3 (three) times daily with meals.   Insulin Pen Needle (DROPLET PEN NEEDLES) 31G X 5 MM MISC USE ONE NEEDLE SUBCUTANEOUSLY AS DIRECTED (REMOVE AND DISCARD NEEDLE IN SHARPS CONTAINER IMMEDIATELY AFTER USE)   lipase/protease/amylase (CREON) 36000 UNITS CPEP capsule Take 2 capsules (72,000 Units total) by mouth 3 (three) times daily before meals AND 1 capsule (36,000 Units total) with snacks.   loperamide (IMODIUM) 2 MG capsule Take 4-6 mg by mouth 3 (three) times daily before meals.   nitroGLYCERIN (NITROSTAT) 0.4 MG SL tablet Place 1 tablet (0.4 mg total) under the tongue every 5 (five) minutes as needed for chest pain.   nystatin (MYCOSTATIN/NYSTOP) powder Apply 1 g topically 4 (four) times daily as needed (rash).   ondansetron (ZOFRAN-ODT) 4 MG disintegrating tablet Take 1 tablet (4 mg total) by mouth every 8 (eight) hours as needed for nausea or vomiting.   OneTouch Delica Lancets 33G MISC Used to check blood sugar two times a day.   OVER THE COUNTER MEDICATION Apply 1 application  topically daily as needed (pain). [place] Gel topical pain reliever   pantoprazole (PROTONIX) 40 MG tablet Take 1 tablet (40 mg total) by mouth daily.   Polyethyl Glycol-Propyl Glycol  (SYSTANE OP) Place 1 drop into both eyes 4 (four) times daily as needed (dry eyes).   potassium chloride 20 MEQ/15ML (10%) SOLN Take 15 mLs (20 mEq total) by mouth daily.   pregabalin (LYRICA) 50 MG capsule Take 1 capsule (50 mg total) by mouth 2 (two) times daily.   tiZANidine (ZANAFLEX) 4 MG tablet Take 1 tablet (4 mg total) by mouth every 6 (six) hours as needed for muscle spasms.   torsemide (DEMADEX) 20 MG tablet Take 60 mg (3 tablets) daily   [DISCONTINUED] insulin aspart (NOVOLOG) 100 UNIT/ML injection Inject 10 Units into the skin 3 (three) times daily before meals.   [DISCONTINUED] insulin degludec (TRESIBA FLEXTOUCH) 100 UNIT/ML FlexTouch Pen Inject 12 Units into the skin daily.   apixaban (ELIQUIS) 5 MG TABS tablet Take 1 tablet (5 mg total) by mouth 2 (two) times daily.   insulin degludec (TRESIBA FLEXTOUCH) 100 UNIT/ML FlexTouch Pen Inject 15 Units into the skin daily.   No facility-administered encounter medications on file as of 06/24/2023.    ALLERGIES: Allergies  Allergen Reactions   Demeclocycline Hives   Erythromycin Nausea And Vomiting and Other (See Comments)    Severe irritable bowel   Flagyl [Metronidazole] Nausea And Vomiting and Other (See Comments)    Severe irritable bowel   Glucophage [Metformin Hcl] Nausea And Vomiting and Other (See Comments)    "Sick" "I won't take anything that has metformin in it"   Tetracyclines & Related Hives and Rash   Potassium Chloride  Other (See Comments)    Cannot swallow potssium pills, able to tolerate liquid potassium   Tetracycline Other (See Comments)   Diovan [Valsartan] Nausea Only        Sulfa Antibiotics Other (See Comments) and Rash    As child   Xanax [Alprazolam] Other (See Comments)    Hyperactivity     VACCINATION STATUS: Immunization History  Administered Date(s) Administered   Fluad Quad(high Dose 65+) 03/06/2019, 06/07/2020, 07/27/2021, 07/13/2022   Fluad Trivalent(High Dose 65+) 03/29/2023   Influenza  Split 06/13/2011   Influenza, High Dose Seasonal PF 08/21/2018   Influenza,inj,Quad PF,6+ Mos 05/04/2014, 05/12/2015   Influenza-Unspecified 04/15/2012   Moderna Sars-Covid-2 Vaccination 02/02/2020, 03/01/2020, 09/01/2020   Pneumococcal Conjugate-13 08/11/2013, 01/06/2014   Pneumococcal Polysaccharide-23 01/19/2010, 09/03/2018   Tdap 04/18/2016, 11/20/2020    Diabetes She presents for her follow-up diabetic visit. She has type 2 diabetes mellitus. Onset time: Diagnosed at approx age of 60. Her disease course has been worsening. There are no hypoglycemic associated symptoms. (syncope) Associated symptoms include fatigue, foot paresthesias, polydipsia and polyuria (on diuretics). Pertinent negatives for diabetes include no weight loss. There are no hypoglycemic complications. (Been known to have syncopal episodes in the past) Symptoms are stable. Diabetic complications include a CVA (incidental finding), heart disease, nephropathy and peripheral neuropathy. Risk factors for coronary artery disease include obesity, hypertension, diabetes mellitus, dyslipidemia, family history, sedentary lifestyle and post-menopausal. Current diabetic treatment includes intensive insulin program and oral agent (monotherapy). She is compliant with treatment most of the time. Her weight is increasing rapidly. She is following a generally unhealthy (she admits to eating on the go a lot due to various appointments) diet. When asked about meal planning, she reported none. She has not had a previous visit with a dietitian. She rarely participates in exercise. Her home blood glucose trend is increasing steadily. Her overall blood glucose range is >200 mg/dl. (She presents today, accompanied by her husband, with her CGM showing fluctuating glycemic profile with above target readings overall.  Her POCT A1c today is 9.5%, increasing from last visit of 7.6%.  Analysis of her CGM shows TIR 13%, TAR 87%, TBR 0% with a GMI of 9.3%.  She  has been in the hospital several times for complications from CHF.  She also notes she recently experienced a fall and is complaining of her right side hurting (has not seen anyone for this yet).  She notes she will be reaching out to her cardiologist due to recent weight gain.) An ACE inhibitor/angiotensin II receptor blocker is not being taken. She sees a podiatrist.Eye exam is current.     Review of systems  Constitutional: + rapidly increasing body weight, current Body mass index is 42.62 kg/m., + fatigue, no subjective hyperthermia, no subjective hypothermia Eyes: no blurry vision, no xerophthalmia ENT: no sore throat, no nodules palpated in throat, no dysphagia/odynophagia, no hoarseness Cardiovascular: no chest pain, no shortness of breath, no palpitations, + intermittent leg swelling Respiratory: no cough, + shortness of breath Gastrointestinal: no nausea/vomiting, on Creon for EPI Musculoskeletal: no muscle/joint aches, walks with walker Skin: no rashes, no hyperemia Neurological: no tremors, no numbness, no tingling, no dizziness Psychiatric: no depression, no anxiety  Objective:     BP 98/63 (BP Location: Left Arm, Patient Position: Sitting, Cuff Size: Large)   Pulse 77   Ht 5' 4.5" (1.638 m)   Wt 252 lb 3.2 oz (114.4 kg)   BMI 42.62 kg/m   Wt Readings from Last 3 Encounters:  06/24/23  252 lb 3.2 oz (114.4 kg)  06/17/23 246 lb 8 oz (111.8 kg)  05/24/23 247 lb (112 kg)     BP Readings from Last 3 Encounters:  06/24/23 98/63  06/17/23 90/60  05/24/23 (!) 118/49     Physical Exam- Limited  Constitutional:  Body mass index is 42.62 kg/m. , not in acute distress, normal state of mind Eyes:  EOMI, no exophthalmos Musculoskeletal: no gross deformities, strength intact in all four extremities, no gross restriction of joint movements, walks with walker Skin:  no rashes, no hyperemia Neurological: no tremor with outstretched hands  Diabetic Foot Exam - Simple   No  data filed     CMP ( most recent) CMP     Component Value Date/Time   NA 133 (L) 05/23/2023 1329   NA 142 03/05/2023 1326   NA 141 06/21/2014 1349   K 3.7 05/23/2023 1329   K 4.0 06/21/2014 1349   CL 95 (L) 05/23/2023 1329   CL 106 06/21/2014 1349   CO2 26 05/23/2023 1329   CO2 24 06/21/2014 1349   GLUCOSE 238 (H) 05/23/2023 1329   GLUCOSE 174 (H) 06/21/2014 1349   BUN 29 (H) 05/23/2023 1329   BUN 13 03/05/2023 1326   BUN 12 06/21/2014 1349   CREATININE 1.57 (H) 05/23/2023 1329   CREATININE 1.14 (H) 07/13/2022 1529   CALCIUM 8.5 (L) 05/23/2023 1329   CALCIUM 8.7 06/21/2014 1349   PROT 7.3 03/28/2023 1213   PROT 7.3 10/30/2012 1436   ALBUMIN 3.6 03/28/2023 1213   ALBUMIN 3.0 (L) 10/30/2012 1436   AST 19 03/28/2023 1213   AST 16 10/30/2012 1436   ALT 16 03/28/2023 1213   ALT 19 10/30/2012 1436   ALKPHOS 125 03/28/2023 1213   ALKPHOS 125 10/30/2012 1436   BILITOT 1.0 03/28/2023 1213   BILITOT 0.2 10/30/2012 1436   GFRNONAA 34 (L) 05/23/2023 1329   GFRNONAA 57 (L) 06/21/2014 1349   GFRNONAA 55 (L) 02/25/2014 2153   GFRNONAA 70 12/13/2011 0919   GFRAA >60 03/23/2020 1204   GFRAA >60 06/21/2014 1349   GFRAA >60 02/25/2014 2153   GFRAA 80 12/13/2011 0919     Diabetic Labs (most recent): Lab Results  Component Value Date   HGBA1C 9.5 (A) 06/24/2023   HGBA1C 7.6 (A) 03/21/2023   HGBA1C 7.8 (A) 12/06/2022   MICROALBUR <0.7 11/21/2021   MICROALBUR 2.8 (H) 06/04/2017   MICROALBUR 9.5 (H) 06/29/2016     Lipid Panel ( most recent) Lipid Panel     Component Value Date/Time   CHOL 103 10/23/2022 1324   CHOL 155 02/27/2014 0420   TRIG 98 10/23/2022 1324   TRIG 121 02/27/2014 0420   HDL 38 (L) 10/23/2022 1324   HDL 43 02/27/2014 0420   CHOLHDL 2.7 10/23/2022 1324   VLDL 20 10/23/2022 1324   VLDL 24 02/27/2014 0420   LDLCALC 45 10/23/2022 1324   LDLCALC 114 (H) 07/13/2022 1529   LDLCALC 88 02/27/2014 0420   LDLDIRECT 126 (H) 07/13/2022 1529      Lab  Results  Component Value Date   TSH 2.887 09/27/2022   TSH 2.209 02/08/2021   TSH 3.695 07/17/2020   TSH 3.50 01/01/2019   TSH 2.279 08/20/2017   TSH 4.400 06/16/2015   TSH 1.16 05/03/2015   TSH 0.408 (L) 10/29/2014   TSH 5.099 (H) 08/13/2014   TSH 0.93 12/03/2013   FREET4 1.10 05/03/2015           Assessment & Plan:  1) Type 2 diabetes mellitus with hyperglycemia, with long-term current use of insulin (HCC)  She presents today, accompanied by her husband, with her CGM showing fluctuating glycemic profile with above target readings overall.  Her POCT A1c today is 9.5%, increasing from last visit of 7.6%.  Analysis of her CGM shows TIR 13%, TAR 87%, TBR 0% with a GMI of 9.3%.  She has been in the hospital several times for complications from CHF.  She also notes she recently experienced a fall and is complaining of her right side hurting (has not seen anyone for this yet).  She notes she will be reaching out to her cardiologist due to recent weight gain.  - COLIN PUCK has currently uncontrolled symptomatic type 2 DM since 77 years of age.   -Recent labs reviewed.  - I had a long discussion with her about the progressive nature of diabetes and the pathology behind its complications. -her diabetes is complicated by CAD, CHF, Neuropathy, CKD stage 3, CVA and she remains at a high risk for more acute and chronic complications which include CAD, CVA, CKD, retinopathy, and neuropathy. These are all discussed in detail with her.  The following Lifestyle Medicine recommendations according to American College of Lifestyle [facility]) were discussed and offered to patient and she agrees to start the journey:  A. Whole Foods, Plant-based plate comprising of fruits and vegetables, plant-based proteins, whole-grain carbohydrates was discussed in detail with the patient.   A list for source of those nutrients were also provided to the patient.  Patient will use only water or unsweetened  tea for hydration. B.  The need to stay away from risky substances including alcohol, smoking; obtaining 7 to 9 hours of restorative sleep, at least 150 minutes of moderate intensity exercise weekly, the importance of healthy social connections,  and stress reduction techniques were discussed. C.  A full color page of  Calorie density of various food groups per pound showing examples of each food groups was provided to the patient.  - Nutritional counseling repeated at each appointment due to patients tendency to fall back in to old habits.  - The patient admits there is a room for improvement in their diet and drink choices. -  Suggestion is made for the patient to avoid simple carbohydrates from their diet including Cakes, Sweet Desserts / Pastries, Ice Cream, Soda (diet and regular), Sweet Tea, Candies, Chips, Cookies, Sweet Pastries, Store Bought Juices, Alcohol in Excess of 1-2 drinks a day, Artificial Sweeteners, Coffee Creamer, and "Sugar-free" Products. This will help patient to have stable blood glucose profile and potentially avoid unintended weight gain.   - I encouraged the patient to switch to unprocessed or minimally processed complex starch and increased protein intake (animal or plant source), fruits, and vegetables.   - Patient is advised to stick to a routine mealtimes to eat 3 meals a day and avoid unnecessary snacks (to snack only to correct hypoglycemia).  - I have approached her with the following individualized plan to manage her diabetes and patient agrees:   -She is advised to increase her Tresiba to 15 units SQ nightly.  I did adjust her Novolog slightly to 10-16 units TID with meals if glucose is above 90 and she is eating (Specific instructions on how to titrate insulin dosage based on glucose readings given to patient in writing).  She can continue her Farxiga 10 mg po daily.   -she is encouraged to continue monitoring glucose 4 times daily (using  her CGM), before meals  and before bed, and to call the clinic if she has readings less than 70 or above 300 for 3 tests in a row.  - she is warned not to take insulin without proper monitoring per orders. - Adjustment parameters are given to her for hypo and hyperglycemia in writing.  - she is not a candidate for Metformin due to concurrent renal insufficiency, nor did she tolerate it in the past.  She also did not tolerate GLP1 products due to nausea previously.  - Specific targets for  A1c; LDL, HDL, and Triglycerides were discussed with the patient.  2) Blood Pressure /Hypertension:  her blood pressure is controlled to target-slightly low today in office.   she is advised to continue her current medications per cardiology/PCP.  3) Lipids/Hyperlipidemia:    Review of her recent lipid panel from 10/23/22 showed controlled LDL at 45 .  she is advised to continue Lipitor 80 mg daily at bedtime and Zetia 10 mg po daily.  Side effects and precautions discussed with her.  4)  Weight/Diet:  her Body mass index is 42.62 kg/m.  -  clearly complicating her diabetes care.   she is a candidate for weight loss. I discussed with her the fact that loss of 5 - 10% of her  current body weight will have the most impact on her diabetes management.  Exercise, and detailed carbohydrates information provided  -  detailed on discharge instructions.  She has had gastric bypass surgery x 2.  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  Recent imaging of abdomen (done for different reason) shows stable mass, still consistent with benignity.  She will not need additional follow up for this.  6) Chronic Care/Health Maintenance: -she is not on ACEI/ARB and is on Statin medications and is encouraged to initiate and continue to follow up with Ophthalmology, Dentist, Podiatrist at least yearly or according to recommendations, and advised to stay away from smoking. I have recommended yearly flu vaccine and pneumonia  vaccine at least every 5 years; moderate intensity exercise for up to 150 minutes weekly; and sleep for at least 7 hours a day.  - she is advised to maintain close follow up with Sherlene Shams, MD for primary care needs, as well as her other providers for optimal and coordinated care.  I do wonder if there could be a component of EPI affecting her digestion of foods.  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     I spent  46  minutes in the care of the patient today including review of labs from CMP, Lipids, Thyroid Function, Hematology (current and previous including abstractions from other facilities); face-to-face time discussing  her blood glucose readings/logs, discussing hypoglycemia and hyperglycemia episodes and symptoms, medications doses, her options of short and long term treatment based on the latest standards of care / guidelines;  discussion about incorporating lifestyle medicine;  and documenting the encounter. Risk reduction counseling performed per USPSTF guidelines to reduce obesity and cardiovascular risk factors.     Please refer to Patient Instructions for Blood Glucose Monitoring and Insulin/Medications Dosing Guide"  in media tab for additional information. Please  also refer to " Patient Self Inventory" in the Media  tab for reviewed elements of pertinent patient history.  Denise Sharp participated in the discussions, expressed understanding, and voiced agreement with the above plans.  All questions were answered to  her satisfaction. she is encouraged to contact clinic should she have any questions or concerns prior to her return visit.     Follow up plan: - Return in about 3 months (around 09/22/2023) for Diabetes F/U with A1c in office, No previsit labs, Bring meter and logs.   Ronny Bacon, [facility] [address] [place], [place] [address] Phone: [phone] Fax: [phone]  06/24/2023,  11:53 AM

## 2023-06-24 NOTE — Telephone Encounter (Signed)
Patient came into office today stating she was up 7lbs today. Spoke with NP Inetta Fermo, and she look over office notes and patients medication list. Her recommendations are to increase Torsemide to 80 mg Twice daily for 3 days and to double her dose of potassium to or 30 mls for 3 days only, then return to her normal dose of both medications. Have scheduled a follow up with Dr. Gasper Lloyd for next week. Instructed patient if symptoms get worse she is to report to the Emergency room

## 2023-06-24 NOTE — Progress Notes (Signed)
06/24/2023 2:13 PM   Denise Sharp 12-Feb-1946 [id number]  Referring provider: Sherlene Shams, MD [address] [place],  [place] [address]  Chief Complaint  Patient presents with   Follow-up    HPI: I placed an InterStim device September 18, 2021.  I had removed an old device.  The case took a long time and actually tried both sides to get finally good responses.  The lead was placed in the same position but in my opinion improved versus the initial InterStim lead.   The patient had presented with high-volume urge incontinence wearing 6-8 pads a day with flooding foot on the floor syndrome in the middle of the night.  She wanted an MRI compatible device as well.  She had been dry on the InterStim years back.   Little to no pain. Incisions look great.  She is completely continent and very happy     Excellent bladder control.  In the last 3-week she has had some left-sided pain and she wondered if it could be due to her InterStim.  Clinically not infected   Patient is urine looked infected.  She had a negative CT scan in December 2022.  She had a negative cystoscopy by Dr. Apolinar Junes in August 2022.  This is being done for blood in the urine.  She had a positive culture on that day.  She had to be given fosfomycin.   Patient might have a bladder infection.  Reassurance given about InterStim.  Call if culture positive.  See the patient in 1 year.  Called in ciprofloxacin 250 mg twice a day for 7 days   I saw the patient 1 year ago.  She has been seen multiple times by our nurse practitioner last time being October 2023.  Had a positive urine culture July 2023 when I saw her and since then had yeast in the urine once.  The InterStim procedure was in March 2023.  I reviewed the operative note and other notes prior.  It was a challenging procedure as noted.  She in the past has had uncontrolled diabetes.  She has been offered vaginal estrogen cream for recurrent UTIs   Patient was  recently admitted in September 2024 for acute on chronic congestive heart failure.  She may have gained as much is 50 pounds in weight and has been switched from Lasix to a stronger diuretic.  She has chronic renal insufficiency diabetes atrial fibrillation sleep apnea obesity and other cardiac issues. She wears 4-6 pads a day that can be very wet with urge incontinence especially when she goes from sitting to standing position.  She has not been changing the settings of the InterStim device.  She also wears pads for complete loss control of bowels with diarrhea and fecal incontinence.  She said her sugar control varies.  She does not think she has had bladder infections in the last 6 months.    I think we need to have reasonable treatment goals.  If this urine culture is positive I will put her on urinary prophylaxis at least for a while but otherwise treat positive cultures as needed.  I will have the patient see the [employer] representative as she is seeing our nurse practitioner a number of times.  I do not think replacing or changing InterStim is the answer based upon the previous operative experience and difficulties, patient's medical issues, and risk factors.  Botox would be an option but she would be at high risk of  retention.  Percutaneous tibial nerve stimulation an option.  I had initially thought years ago she was not a good candidate for refractory therapies but the InterStim was placed in [place] in 2015 and she actually did quite well.  It was reasonable to try Gemtesa in the meantime   Patient is already lost 36 pounds in fluid.  She agreed with the above treatment plan.  I gave her the [place], sent urine for culture and will have the rep see her  Today I last saw the patient in September 2024.Malvin Johns physician assistant many times.  She had been seen by the nurse practitioner in 2023 several times.  I do not see where she saw the [employer] representative.  Culture in September was  negative  Does not reduce some of the urgency but only minimal benefit.  She can sit for 8 hours but since she stands up her feet hit the floor she has high-volume soaking incontinence.  She gained another 15 pounds recently he is going to the cardiac center today.  Hemoglobin A1c now is over 9 from 7.  She has to have her rings taken off due to fluid retention   PMH: Past Medical History:  Diagnosis Date   (HFpEF) heart failure with preserved ejection fraction (HCC)    a. 12/2019 Echo: EF 65-70%, Gr2 DD. No significant valvular dzs.   Abnormality of gait 03/25/2013   Adrenal mass, left (HCC)    Anginal pain (HCC)    Anxiety    Aortic atherosclerosis (HCC)    Arthritis    Asthma    CAD (coronary artery disease) with h/o Atypical Chest Pain    a. 04/1986 Cath ([facility]): nl cors, EF 65%; b. 07/2012 Cath [facility]): Diff minor irregs; c. 07/2016 MV [facility]): "Equivocal"; d. 08/2016 Cardiac CT Ca2+ score [facility]): Ca2+ 1548; e. 05/2019 MV: No ischemia. EF 75%; f. 12/2019 PCI: LM nl, LAD 65ost (3.5x12 Resolute DES), 71m (2.75x12 Resolute DES),LCX/RCA nl; g. 08/2020 Cath: patent LAD stents, RCA 40ost, elev RH pressures, EF >65%.   Cervical spinal stenosis 1994   due to trauma to back ([employer] accident), has intermittent paralysis and parasthesias   Cervicogenic headache 03/23/2014   CKD (chronic kidney disease), stage III (HCC)    Depression    Diverticulosis    Dizziness    a.) chronic   DJD (degenerative joint disease)    a. Chronic R shoulder pain   Dyspnea    Esophageal stenosis 09/2009   a.) transient outlet obstruction by food, cleared by EGD   Family history of adverse reaction to anesthesia    a.) daughter with (+) PONV   Gastric bypass status for obesity    GERD (gastroesophageal reflux disease)    Headache(784.0)    HLD (hyperlipidemia)    Hypertension    IBS (irritable bowel syndrome)    IDA (iron deficiency anemia)    a.) post 2 unit txfsn 2009, normal endo/colonoscopy by [facility]   Left  bundle branch block (LBBB)    a.) Intermittently present - likely rate related.   NSVT (nonsustained ventricular tachycardia) (HCC) 11/15/2020   a.) Holter study 11/15/2020; 11 episodes of NSVT lasting up to 8 beats with a maximum rate of 210 bpm; 14 atrial runs lasting up to 18 beats with a rate of up to 250 bpm.  Some atrial runs felt to be NSVT.   NSVT (nonsustained ventricular tachycardia) (HCC)    a. 10/2020 [facility]: 11 runs of NSVT up to 8 beats.  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 10/2020 [facility]: 14 atrial runs up to 18 beats, max HR 250.   Restless leg syndrome    Rotator cuff arthropathy, right 08/13/2017   Syncope and collapse 03/12/2014   Type II diabetes mellitus (HCC)     Surgical History: Past Surgical History:  Procedure Laterality Date   APPENDECTOMY     BACK SURGERY     CARDIOVERSION N/A 10/30/2022   Procedure: CARDIOVERSION;  Surgeon: [name], MD;  Location: [facility] ORS;  Service: Cardiovascular;  Laterality: N/A;   CHOLECYSTECTOMY N/A 1976   COLONOSCOPY     CORONARY ANGIOPLASTY     CORONARY STENT INTERVENTION N/A 01/04/2020   Procedure: CORONARY STENT INTERVENTION;  Surgeon: Iran Ouch, MD;  Location: [facility] INVASIVE CV LAB;  Service: Cardiovascular;  Laterality: N/A;  LAD    DIAPHRAGMATIC HERNIA REPAIR  2015   ESOPHAGEAL DILATION     multiple   ESOPHAGOGASTRODUODENOSCOPY (EGD) WITH PROPOFOL N/A 05/11/2021   Procedure: ESOPHAGOGASTRODUODENOSCOPY (EGD) WITH PROPOFOL;  Surgeon: [name], MD;  Location: [facility] ENDOSCOPY;  Service: Endoscopy;  Laterality: N/A;   ESOPHAGOGASTRODUODENOSCOPY (EGD) WITH PROPOFOL N/A 06/13/2021   Procedure: ESOPHAGOGASTRODUODENOSCOPY (EGD) WITH PROPOFOL;  Surgeon: [name], MD;  Location: [facility] END[facility];  Service: Endoscopy;  Laterality: N/A;   ESOPHAGOGASTRODUODENOSCOPY  (EGD) WITH PROPOFOL N/A 02/28/2023   Procedure: ESOPHAGOGASTRODUODENOSCOPY (EGD) WITH PROPOFOL;  Surgeon: [name], MD;  Location: [facility] ENDOSCOPY;  Service: Endoscopy;  Laterality: N/A;   EYE SURGERY     GASTRIC BYPASS  2000, 2005   Dr. Arlana Pouch IMPLANT PLACEMENT  10/2011   Jeannette How IMPLANT PLACEMENT N/A 09/18/2021   Procedure: Leane Platt IMPLANT FIRST STAGE;  Surgeon: Alfredo Martinez, MD;  Location: [facility] ORS;  Service: Urology;  Laterality: N/A;   INTERSTIM IMPLANT PLACEMENT N/A 09/18/2021   Procedure: Leane Platt IMPLANT SECOND STAGE WITH IMPEDENCE CHECK;  Surgeon: Alfredo Martinez, MD;  Location: [facility];  Service: Urology;  Laterality: N/A;   INTERSTIM IMPLANT REMOVAL N/A 09/18/2021   Procedure: REMOVAL OF INTERSTIM IMPLANT;  Surgeon: Alfredo Martinez, MD;  Location: [facility];  Service: Urology;  Laterality: N/A;   LEFT HEART CATH AND CORONARY ANGIOGRAPHY N/A 04/1986   LEFT HEART CATH AND CORONARY ANGIOGRAPHY Left 07/24/2012   LEFT HEART CATH AND CORS/GRAFTS ANGIOGRAPHY N/A 12/31/2019   Procedure: LEFT HEART CATH AND CORS/GRAFTS ANGIOGRAPHY;  Surgeon: Antonieta Iba, MD;  Location: [facility];  Service: Cardiovascular;  Laterality: N/A;   PANNICULECTOMY N/A 06/16/2019   Procedure: PANNICULECTOMY;  Surgeon: Allena Napoleon, MD;  Location: [facility];  Service: Plastics;  Laterality: N/A;  3 hours, please   REVERSE SHOULDER ARTHROPLASTY Right 08/13/2017   Procedure: REVERSE RIGHT SHOULDER ARTHROPLASTY;  Surgeon: Teryl Lucy, MD;  Location: [facility];  Service: Orthopedics;  Laterality: Right;   RIGHT/LEFT HEART CATH AND CORONARY ANGIOGRAPHY N/A 09/06/2020   Procedure: RIGHT/LEFT HEART CATH AND CORONARY ANGIOGRAPHY;  Surgeon: [name], MD;  Location: [facility];  Service: Cardiovascular;  Laterality: N/A;   RIGHT/LEFT HEART CATH AND CORONARY ANGIOGRAPHY N/A 06/04/2022   Procedure: RIGHT/LEFT HEART CATH AND CORONARY ANGIOGRAPHY;  Surgeon: [name], MD;  [facility]: [facility];  Service: Cardiovascular;  Laterality: N/A;   ROTATOR CUFF REPAIR Right    SPINE SURGERY  1995   [place]  TOOTH EXTRACTION  11/13/2021   TOTAL ABDOMINAL HYSTERECTOMY W/ BILATERAL SALPINGOOPHORECTOMY  1974   TOTAL KNEE ARTHROPLASTY Bilateral 2007   Procedure: TOTAL KNEE ARTHROPLASTY; Surgeons: Gerrit Heck, MD and [name], MD   UMBILICAL HERNIA REPAIR  08/11/2015    Home Medications:  Allergies as of 06/24/2023       Reactions   Demeclocycline Hives   Erythromycin Nausea And Vomiting, Other (See Comments)   Severe irritable bowel   Flagyl [metronidazole] Nausea And Vomiting, Other (See Comments)   Severe irritable bowel   Glucophage [metformin Hcl] Nausea And Vomiting, Other (See Comments)   "Sick" "I won't take anything that has metformin in it"   Tetracyclines & Related Hives, Rash   Potassium Chloride Other (See Comments)   Cannot swallow potssium pills, able to tolerate liquid potassium   Tetracycline Other (See Comments)   Diovan [valsartan] Nausea Only       Sulfa Antibiotics Other (See Comments), Rash   As child   Xanax [alprazolam] Other (See Comments)   Hyperactivity         Medication List        Accurate as of June 24, 2023  2:13 PM. If you have any questions, ask your nurse or doctor.          STOP taking these medications    insulin aspart 100 UNIT/ML injection Commonly known as: novoLOG Replaced by: NovoLOG FlexPen 100 UNIT/ML FlexPen Stopped by: Whitney J Reardon       TAKE these medications    apixaban 5 MG Tabs tablet Commonly known as: ELIQUIS Take 1 tablet (5 mg total) by mouth 2 (two) times daily.   atorvastatin 80 MG tablet Commonly known as: LIPITOR Take 1 tablet (80 mg total) by mouth daily.   budesonide-formoterol 160-4.5 MCG/ACT inhaler Commonly known as: SYMBICORT Inhale 2 puffs into the lungs 2 (two) times daily.   butalbital-acetaminophen-caffeine 50-325-40 MG tablet Commonly  known as: FIORICET TAKE 1 TABLET BY MOUTH EVERY 6 HOURS AS NEEDED FOR HEADACHE.   clotrimazole-betamethasone cream Commonly known as: LOTRISONE Apply 1 application topically 2 (two) times daily as needed (irritation).   cyanocobalamin 1000 MCG/ML injection Commonly known as: VITAMIN B12 Inject 1 mL (1,000 mcg total) into the muscle every 30 (thirty) days. Inject 1 ml (1000 mcg ) IM weekly x 4,  Then monthly thereafter   dapagliflozin propanediol 10 MG Tabs tablet Commonly known as: FARXIGA Take 1 tablet (10 mg total) by mouth daily.   dicyclomine 20 MG tablet Commonly known as: BENTYL Take 1 tablet (20 mg total) by mouth 3 (three) times daily before meals.   Droplet Pen Needles 31G X 5 MM Misc Generic drug: Insulin Pen Needle USE ONE NEEDLE SUBCUTANEOUSLY AS DIRECTED (REMOVE AND DISCARD NEEDLE IN SHARPS CONTAINER IMMEDIATELY AFTER USE)   DULoxetine 60 MG capsule Commonly known as: Cymbalta Take 1 capsule (60 mg total) by mouth daily.   ezetimibe 10 MG tablet Commonly known as: ZETIA Take 1 tablet (10 mg total) by mouth daily.   fexofenadine 180 MG tablet Commonly known as: ALLEGRA Take 180 mg by mouth as needed for allergies or rhinitis.   fluticasone 50 MCG/ACT nasal spray Commonly known as: FLONASE Place 2 sprays into both nostrils daily.   FreeStyle Libre 2 Reader Hardie Pulley Use to check sugar at least TID   Gvoke HypoPen 2-Pack 0.5 MG/0.1ML Soaj Generic drug: Glucagon Inject 1 auto injector subcutaneously as needed for severe hypoglycemia. May repeat x 1 after 15 minutes if needed.  hydrocortisone 25 MG suppository Commonly known as: ANUSOL-HC Place 1 suppository (25 mg total) rectally 2 (two) times daily.   Kerendia 10 MG Tabs Generic drug: Finerenone Take 1 tablet (10 mg total) by mouth in the morning.   lipase/protease/amylase 29518 UNITS Cpep capsule Commonly known as: Creon Take 2 capsules (72,000 Units total) by mouth 3 (three) times daily before meals AND  1 capsule (36,000 Units total) with snacks.   loperamide 2 MG capsule Commonly known as: IMODIUM Take 4-6 mg by mouth 3 (three) times daily before meals.   nitroGLYCERIN 0.4 MG SL tablet Commonly known as: NITROSTAT Place 1 tablet (0.4 mg total) under the tongue every 5 (five) minutes as needed for chest pain.   NovoLOG FlexPen 100 UNIT/ML FlexPen Generic drug: insulin aspart Inject 10-16 Units into the skin 3 (three) times daily with meals. Replaces: insulin aspart 100 UNIT/ML injection Started by: Whitney J Reardon   nystatin powder Commonly known as: MYCOSTATIN/NYSTOP Apply 1 g topically 4 (four) times daily as needed (rash).   ondansetron 4 MG disintegrating tablet Commonly known as: ZOFRAN-ODT Take 1 tablet (4 mg total) by mouth every 8 (eight) hours as needed for nausea or vomiting.   OneTouch Delica Lancets 33G Misc Used to check blood sugar two times a day.   OneTouch Verio test strip Generic drug: glucose blood Use to check blood sugar twice daily.   OVER THE COUNTER MEDICATION Apply 1 application  topically daily as needed (pain). [place] Gel topical pain reliever   pantoprazole 40 MG tablet Commonly known as: PROTONIX Take 1 tablet (40 mg total) by mouth daily.   potassium chloride 20 MEQ/15ML (10%) Soln Take 15 mLs (20 mEq total) by mouth daily.   pregabalin 50 MG capsule Commonly known as: LYRICA Take 1 capsule (50 mg total) by mouth 2 (two) times daily.   Premarin vaginal cream Generic drug: conjugated estrogens Apply one pea-sized amount around the opening of the urethra daily for 2 weeks, then 3 times weekly moving forward.   SYSTANE OP Place 1 drop into both eyes 4 (four) times daily as needed (dry eyes).   tiZANidine 4 MG tablet Commonly known as: ZANAFLEX Take 1 tablet (4 mg total) by mouth every 6 (six) hours as needed for muscle spasms.   torsemide 20 MG tablet Commonly known as: DEMADEX Take 60 mg (3 tablets) daily   Tresiba FlexTouch  100 UNIT/ML FlexTouch Pen Generic drug: insulin degludec Inject 15 Units into the skin daily. What changed: how much to take Changed by: Whitney Althea Grimmer   Vitamin D3 250 MCG (10000 UT) capsule Take 10,000 Units by mouth daily.        Allergies:  Allergies  Allergen Reactions   Demeclocycline Hives   Erythromycin Nausea And Vomiting and Other (See Comments)    Severe irritable bowel   Flagyl [Metronidazole] Nausea And Vomiting and Other (See Comments)    Severe irritable bowel   Glucophage [Metformin Hcl] Nausea And Vomiting and Other (See Comments)    "Sick" "I won't take anything that has metformin in it"   Tetracyclines & Related Hives and Rash   Potassium Chloride Other (See Comments)    Cannot swallow potssium pills, able to tolerate liquid potassium   Tetracycline Other (See Comments)   Diovan [Valsartan] Nausea Only        Sulfa Antibiotics Other (See Comments) and Rash    As child   Xanax [Alprazolam] Other (See Comments)    Hyperactivity  Family History: Family History  Problem Relation Age of Onset   Heart disease Father    Hypertension Father    Prostate cancer Father    Stroke Father    Osteoporosis Father    Stroke Mother    Depression Mother    Headache Mother    Heart disease Mother    Thyroid disease Mother    Hypertension Mother    Diabetes Daughter    Heart disease Daughter    Hypertension Daughter    Hypertension Son     Social History:  reports that she has never smoked. She has never been exposed to tobacco smoke. She has never used smokeless tobacco. She reports that she does not currently use drugs. She reports that she does not drink alcohol.  ROS:                                        Physical Exam: BP 122/73   Pulse (!) 101   Ht 5\' 4"  (1.626 m)   Wt 114.3 kg   BMI 43.26 kg/m   Constitutional:  Alert and oriented, No acute distress. HEENT: [place] AT, moist mucus membranes.  Trachea midline, no  masses.   Laboratory Data: Lab Results  Component Value Date   WBC 13.0 (H) 04/08/2023   HGB 14.2 04/08/2023   HCT 44.6 04/08/2023   MCV 92.7 04/08/2023   PLT 313 04/08/2023    Lab Results  Component Value Date   CREATININE 1.57 (H) 05/23/2023    No results found for: "PSA"  No results found for: "TESTOSTERONE"  Lab Results  Component Value Date   HGBA1C 9.5 (A) 06/24/2023    Urinalysis    Component Value Date/Time   COLORURINE YELLOW 02/17/2021 1008   APPEARANCEUR Hazy (A) 04/08/2023 1345   LABSPEC >=1.030 (A) 02/17/2021 1008   LABSPEC 1.006 06/21/2014 1623   PHURINE 5.0 02/17/2021 1008   GLUCOSEU 3+ (A) 04/08/2023 1345   GLUCOSEU 500 (A) 02/17/2021 1008   HGBUR NEGATIVE 02/17/2021 1008   BILIRUBINUR Negative 04/08/2023 1345   BILIRUBINUR Negative 06/21/2014 1623   KETONESUR NEGATIVE 02/17/2021 1008   PROTEINUR Negative 04/08/2023 1345   PROTEINUR NEGATIVE 02/08/2021 1323   UROBILINOGEN 0.2 02/17/2021 1008   NITRITE Negative 04/08/2023 1345   NITRITE NEGATIVE 02/17/2021 1008   LEUKOCYTESUR Negative 04/08/2023 1345   LEUKOCYTESUR SMALL (A) 02/17/2021 1008   LEUKOCYTESUR Negative 06/21/2014 1623    Pertinent Imaging:   Assessment & Plan: Call if urine culture positive.  Patient understands that it will be very difficult to control her incontinence with her fluid situation and diabetes and other medical comorbidities and she understood.  I am not going to push therapy further.  Reassess in 6 months  1. Mixed incontinence  - Urinalysis, Complete   No follow-ups on file.  Martina Sinner, MD  [facility] [address], [address] [place], [place] [address] [phone]

## 2023-06-25 ENCOUNTER — Encounter: Payer: Medicare Other | Attending: Internal Medicine | Admitting: *Deleted

## 2023-06-25 DIAGNOSIS — I5022 Chronic systolic (congestive) heart failure: Secondary | ICD-10-CM

## 2023-06-25 NOTE — Progress Notes (Signed)
Initial phone call completed. Diagnosis can be found in [facility] 11/8. EP Orientation scheduled for Monday 12/23 at 2:30.

## 2023-06-26 LAB — CULTURE, URINE COMPREHENSIVE

## 2023-06-27 ENCOUNTER — Ambulatory Visit: Payer: Medicare Other

## 2023-07-01 ENCOUNTER — Other Ambulatory Visit
Admission: RE | Admit: 2023-07-01 | Discharge: 2023-07-01 | Disposition: A | Discharge status: Home / Self Care | Payer: Medicare Other | Source: Ambulatory Visit | Attending: Cardiology | Admitting: Cardiology

## 2023-07-01 ENCOUNTER — Ambulatory Visit (HOSPITAL_BASED_OUTPATIENT_CLINIC_OR_DEPARTMENT_OTHER): Payer: Medicare Other | Admitting: Cardiology

## 2023-07-01 ENCOUNTER — Encounter: Payer: Self-pay | Admitting: Cardiology

## 2023-07-01 VITALS — BP 113/49 | HR 81 | Resp 18 | Wt 244.2 lb

## 2023-07-01 DIAGNOSIS — R5381 Other malaise: Secondary | ICD-10-CM

## 2023-07-01 DIAGNOSIS — E1122 Type 2 diabetes mellitus with diabetic chronic kidney disease: Secondary | ICD-10-CM | POA: Diagnosis not present

## 2023-07-01 DIAGNOSIS — I5032 Chronic diastolic (congestive) heart failure: Secondary | ICD-10-CM | POA: Insufficient documentation

## 2023-07-01 DIAGNOSIS — N183 Chronic kidney disease, stage 3 unspecified: Secondary | ICD-10-CM | POA: Diagnosis not present

## 2023-07-01 DIAGNOSIS — I10 Essential (primary) hypertension: Secondary | ICD-10-CM

## 2023-07-01 LAB — BASIC METABOLIC PANEL
Anion gap: 14 (ref 5–15)
BUN: 45 mg/dL — ABNORMAL HIGH (ref 8–23)
CO2: 26 mmol/L (ref 22–32)
Calcium: 8.7 mg/dL — ABNORMAL LOW (ref 8.9–10.3)
Chloride: 94 mmol/L — ABNORMAL LOW (ref 98–111)
Creatinine, Ser: 1.72 mg/dL — ABNORMAL HIGH (ref 0.44–1.00)
GFR, Estimated: 30 mL/min — ABNORMAL LOW (ref >60)
Glucose, Bld: 269 mg/dL — ABNORMAL HIGH (ref 70–99)
Potassium: 4 mmol/L (ref 3.5–5.1)
Sodium: 134 mmol/L — ABNORMAL LOW (ref 135–145)

## 2023-07-01 LAB — BRAIN NATRIURETIC PEPTIDE: B Natriuretic Peptide: 40.6 pg/mL (ref 0.0–100.0)

## 2023-07-01 MED ORDER — TORSEMIDE 20 MG PO TABS: ORAL_TABLET | ORAL | 2 refills | Status: DC

## 2023-07-01 NOTE — Progress Notes (Signed)
Advanced Heart Failure Clinic Note   Referring Physician: recent admit PCP: Sherlene Shams, MD (last seen 06/24) Cardiologist: [name], MD (last seen 09/24)  HPI:  Ms Denise Sharp is a 77 y/o female with a history of HFpEF, CAD status post PCI to the LAD, persistent atrial fibrillation, type 2 diabetes, CKD and morbid obesity presenting today for follow up.  Admitted 03/28/23 due to 30 pound weight gain requiring IV diuresis.  At that time she was taking Lasix 20 mg once daily to once every other day.  She was admitted to [facility] where echocardiogram demonstrated preserved EF with grade 2 diastolic dysfunction.  She was diuresed 4 L through admission and transition to torsemide 20 mg daily at discharge.  Throughout admission she maintained normal sinus rhythm.  She did have rectal bleeding that was secondary to hemorrhoids.  Echo 03/25/12: EF 55-65% with mild AR Echo 08/21/17: EF 65-70% with Grade II DD and mildly elevated PA pressure of 33 mmHg Echo 12/30/19: EF 65-70% with Grade II DD Echo 03/29/23: EF 60-65% with mild LVH, Grade II DD  RHC/ LHC 06/04/22: Mild-moderate, non-obstructive coronary artery disease, similar to prior catheterization in 08/2020. Widely patent ostial LAD stent. Patent mid LAD stent with minimal in stent restenosis (~10% narrowing). Upper normal left and right heart filling pressures. Borderline elevated pulmonary artery pressure. Normal to mildly reduced cardiac output/index.  Since our last visit she has continued to do fairly well from a volume standpoint.  - Currently taking torsemide 75m in the morning and 60mg  at night. She would like to reduce the daytime due to frequency of urine output and lack of bladder control.  - Will plan to start physical therapy 07/08/23 - Reports she is finally starting to feel better.    Past Medical History:  Diagnosis Date   (HFpEF) heart failure with preserved ejection fraction (HCC)    a. 12/2019 Echo: EF 65-70%, Gr2 DD.  No significant valvular dzs.   Abnormality of gait 03/25/2013   Adrenal mass, left (HCC)    Anginal pain (HCC)    Anxiety    Aortic atherosclerosis (HCC)    Arthritis    Asthma    CAD (coronary artery disease) with h/o Atypical Chest Pain    a. 04/1986 Cath ([facility]): nl cors, EF 65%; b. 07/2012 Cath [facility]): Diff minor irregs; c. 07/2016 [facility]): "Equivocal"; d. 08/2016 Cardiac CT Ca2+ score [facility]): Ca2+ 1548; e. 05/2019 [facility]: No ischemia. EF 75%; f. 12/2019 PCI: LM nl, LAD 65ost (3.5x12 Resolute DES), 66m (2.75x12 Resolute DES),LCX/RCA nl; g. 08/2020 Cath: patent LAD stents, RCA 40ost, elev RH pressures, EF >65%.   Cervical spinal stenosis 1994   due to trauma to back ([employer] accident), has intermittent paralysis and parasthesias   Cervicogenic headache 03/23/2014   CKD (chronic kidney disease), stage III (HCC)    Depression    Diverticulosis    Dizziness    a.) chronic   DJD (degenerative joint disease)    a. Chronic R shoulder pain   Dyspnea    Esophageal stenosis 09/2009   a.) transient outlet obstruction by food, cleared by EGD   Family history of adverse reaction to anesthesia    a.) daughter with (+) PONV   Gastric bypass status for obesity    GERD (gastroesophageal reflux disease)    Headache(784.0)    HLD (hyperlipidemia)    Hypertension    IBS (irritable bowel syndrome)    IDA (iron deficiency anemia)    a.) post 2 unit  txfsn 2009, normal endo/colonoscopy by [facility]   Left bundle branch block (LBBB)    a.) Intermittently present - likely rate related.   NSVT (nonsustained ventricular tachycardia) (HCC) 11/15/2020   a.) Holter study 11/15/2020; 11 episodes of NSVT lasting up to 8 beats with a maximum rate of 210 bpm; 14 atrial runs lasting up to 18 beats with a rate of up to 250 bpm.  Some atrial runs felt to be NSVT.   NSVT (nonsustained ventricular tachycardia) (HCC)    a. 10/2020 [facility]: 11 runs of NSVT up to 8 beats.   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 10/2020 [facility]: 14 atrial runs up to 18 beats, max HR 250.   Restless leg syndrome    Rotator cuff arthropathy, right 08/13/2017   Syncope and collapse 03/12/2014   Type II diabetes mellitus (HCC)     Current Outpatient Medications  Medication Sig Dispense Refill   apixaban (ELIQUIS) 5 MG TABS tablet Take 1 tablet (5 mg total) by mouth 2 (two) times daily. 180 tablet 1   atorvastatin (LIPITOR) 80 MG tablet Take 1 tablet (80 mg total) by mouth daily. 90 tablet 1   budesonide-formoterol (SYMBICORT) 160-4.5 MCG/ACT inhaler Inhale 2 puffs into the lungs 2 (two) times daily. 1 each 6   butalbital-acetaminophen-caffeine (FIORICET) 50-325-40 MG tablet TAKE 1 TABLET BY MOUTH EVERY 6 HOURS AS NEEDED FOR HEADACHE. 60 tablet 2   Cholecalciferol (VITAMIN D3) 250 MCG (10000 UT) capsule Take 10,000 Units by mouth daily.     clotrimazole-betamethasone (LOTRISONE) cream Apply 1 application topically 2 (two) times daily as needed (irritation).     conjugated estrogens (PREMARIN) vaginal cream Apply one pea-sized amount around the opening of the urethra daily for 2 weeks, then 3 times weekly moving forward. 30 g 4   Continuous Blood Gluc Receiver (FREESTYLE LIBRE 2 READER) DEVI Use to check sugar at least TID 1 each 1   cyanocobalamin (VITAMIN B12) 1000 MCG/ML injection Inject 1 mL (1,000 mcg total) into the muscle every 30 (thirty) days. Inject 1 ml (1000 mcg ) IM weekly x 4,  Then monthly thereafter 3 mL 1   dapagliflozin propanediol (FARXIGA) 10 MG TABS tablet Take 1 tablet (10 mg total) by mouth daily. 90 tablet 3   dicyclomine (BENTYL) 20 MG tablet Take 1 tablet (20 mg total) by mouth 3 (three) times daily before meals. 180 tablet 2   DULoxetine (CYMBALTA) 60 MG capsule Take 1 capsule (60 mg total) by mouth daily. 90 capsule 3   ezetimibe (ZETIA) 10 MG tablet Take 1 tablet (10  mg total) by mouth daily. 90 tablet 3   fexofenadine (ALLEGRA) 180 MG tablet Take 180 mg by mouth as needed for allergies or rhinitis.     Finerenone (KERENDIA) 10 MG TABS Take 1 tablet (10 mg total) by mouth in the morning. 90 tablet 3   fluticasone (FLONASE) 50 MCG/ACT nasal spray Place 2 sprays into both nostrils daily. 16 g 6   Glucagon (GVOKE HYPOPEN 2-PACK) 0.5 MG/0.1ML SOAJ Inject 1 auto injector subcutaneously as needed for severe hypoglycemia. May repeat x 1 after 15 minutes if needed. 0.1 mL 2   glucose blood (ONETOUCH VERIO) test strip Use to check blood sugar twice daily. 100 each 1   hydrocortisone (ANUSOL-HC) 25 MG suppository Place 1 suppository (25 mg total)  rectally 2 (two) times daily. 24 suppository 5   insulin aspart (NOVOLOG FLEXPEN) 100 UNIT/ML FlexPen Inject 10-16 Units into the skin 3 (three) times daily with meals. 36 mL 3   insulin degludec (TRESIBA FLEXTOUCH) 100 UNIT/ML FlexTouch Pen Inject 15 Units into the skin daily. 15 mL 3   Insulin Pen Needle (DROPLET PEN NEEDLES) 31G X 5 MM MISC USE ONE NEEDLE SUBCUTANEOUSLY AS DIRECTED (REMOVE AND DISCARD NEEDLE IN SHARPS CONTAINER IMMEDIATELY AFTER USE) 100 each 1   lipase/protease/amylase (CREON) 36000 UNITS CPEP capsule Take 2 capsules (72,000 Units total) by mouth 3 (three) times daily before meals AND 1 capsule (36,000 Units total) with snacks. 300 capsule 9   loperamide (IMODIUM) 2 MG capsule Take 4-6 mg by mouth 3 (three) times daily before meals.     nitroGLYCERIN (NITROSTAT) 0.4 MG SL tablet Place 1 tablet (0.4 mg total) under the tongue every 5 (five) minutes as needed for chest pain. 25 tablet 5   nystatin (MYCOSTATIN/NYSTOP) powder Apply 1 g topically 4 (four) times daily as needed (rash).     ondansetron (ZOFRAN-ODT) 4 MG disintegrating tablet Take 1 tablet (4 mg total) by mouth every 8 (eight) hours as needed for nausea or vomiting. 30 tablet 0   OneTouch Delica Lancets 33G MISC Used to check blood sugar two times a  day. 100 each 4   OVER THE COUNTER MEDICATION Apply 1 application  topically daily as needed (pain). [place] Gel topical pain reliever     pantoprazole (PROTONIX) 40 MG tablet Take 1 tablet (40 mg total) by mouth daily. 90 tablet 3   Polyethyl Glycol-Propyl Glycol (SYSTANE OP) Place 1 drop into both eyes 4 (four) times daily as needed (dry eyes).     potassium chloride 20 MEQ/15ML (10%) SOLN Take 15 mLs (20 mEq total) by mouth daily. 473 mL 3   pregabalin (LYRICA) 50 MG capsule Take 1 capsule (50 mg total) by mouth 2 (two) times daily. 180 capsule 1   tiZANidine (ZANAFLEX) 4 MG tablet Take 1 tablet (4 mg total) by mouth every 6 (six) hours as needed for muscle spasms. 30 tablet 0   torsemide (DEMADEX) 20 MG tablet Take 60 mg (3 tablets) daily 270 tablet 1   No current facility-administered medications for this visit.    Allergies  Allergen Reactions   Demeclocycline Hives   Erythromycin Nausea And Vomiting and Other (See Comments)    Severe irritable bowel   Flagyl [Metronidazole] Nausea And Vomiting and Other (See Comments)    Severe irritable bowel   Glucophage [Metformin Hcl] Nausea And Vomiting and Other (See Comments)    "Sick" "I won't take anything that has metformin in it"   Tetracyclines & Related Hives and Rash   Potassium Chloride Other (See Comments)    Cannot swallow potssium pills, able to tolerate liquid potassium   Tetracycline Other (See Comments)   Diovan [Valsartan] Nausea Only        Sulfa Antibiotics Other (See Comments) and Rash    As child   Xanax [Alprazolam] Other (See Comments)    Hyperactivity       Social History   Socioeconomic History   Marital status: Married    Spouse name: Alinda Money    Number of children: 2   Years of education: College    Highest education level: Not on file  Occupational History    Employer: retired  Tobacco Use   Smoking status: Never    Passive exposure: Never   Smokeless tobacco:  Never  Vaping Use   Vaping status: Never  Used  Substance and Sexual Activity   Alcohol use: No   Drug use: Not Currently    Comment: prescribed valium   Sexual activity: Not Currently    Birth control/protection: Post-menopausal, Surgical  Other Topics Concern   Not on file  Social History Narrative   Patient lives at home with husband Alinda Money.    Right Handed   Drinks caffeinated tea occasionally   One pet in house, dog   Social Drivers of Health   Financial Resource Strain: Low Risk  (09/15/2021)   Overall Financial Resource Strain (CARDIA)    Difficulty of Paying Living Expenses: Not hard at all  Food Insecurity: No Food Insecurity (04/05/2023)   Hunger Vital Sign    Worried About Running Out of Food in the Last Year: Never true    Ran Out of Food in the Last Year: Never true  Transportation Needs: No Transportation Needs (04/05/2023)   PRAPARE - Administrator, Civil Service (Medical): No    Lack of Transportation (Non-Medical): No  Physical Activity: Insufficiently Active (11/15/2020)   Exercise Vital Sign    Days of Exercise per Week: 2 days    Minutes of Exercise per Session: 40 min  Stress: Stress Concern Present (08/03/2020)   [employer] of Occupational Health - Occupational Stress Questionnaire    Feeling of Stress : Rather much  Social Connections: Not on file  Intimate Partner Violence: Not At Risk (03/28/2023)   Humiliation, Afraid, Rape, and Kick questionnaire    Fear of Current or Ex-Partner: No    Emotionally Abused: No    Physically Abused: No    Sexually Abused: No      Family History  Problem Relation Age of Onset   Heart disease Father    Hypertension Father    Prostate cancer Father    Stroke Father    Osteoporosis Father    Stroke Mother    Depression Mother    Headache Mother    Heart disease Mother    Thyroid disease Mother    Hypertension Mother    Diabetes Daughter    Heart disease Daughter    Hypertension Daughter    Hypertension Son       PHYSICAL  EXAM: Vitals:   07/01/23 1201  BP: (!) 113/49  Pulse: 81  Resp: 18  SpO2: 96%   GENERAL: Well nourished, well developed, and in no apparent distress at rest.  HEENT: Negative for arcus senilis or xanthelasma. There is no scleral icterus.  The mucous membranes are pink and moist.   NECK: Supple, No masses. Normal carotid upstrokes without bruits. No masses or thyromegaly.    CHEST: There are no chest wall deformities. There is no chest wall tenderness. Respirations are unlabored.  Lungs- cta b/l CARDIAC:  JVP: 7 cm          Normal rate with regular rhythm. No murmurs, rubs or gallops.  Pulses are 2+ and symmetrical in upper and lower extremities. No edema.  ABDOMEN: Soft, non-tender, non-distended. There are no masses or hepatomegaly. There are normal bowel sounds.  EXTREMITIES: Warm and well perfused with no cyanosis, clubbing.  LYMPHATIC: No axillary or supraclavicular lymphadenopathy.  NEUROLOGIC: Patient is oriented x3 with no focal or lateralizing neurologic deficits.  PSYCH: Patients affect is appropriate, there is no evidence of anxiety or depression.  SKIN: Warm and dry; no lesions or wounds.     ECG: 03/29/2023: Normal sinus  rhythm, personally reviewed.  ASSESSMENT & PLAN:  1: Chronic heart failure with preserved ejection fraction- - suspect due to obesity, atrial fibrillation, vascular disease, hypertension, CKD - NYHA class III: symptoms 2/2 deconditioning. - Hypervolemic on exam; reports several lb weight gain. Will plan on  - Echo 03/25/12: EF 55-65% with mild AR - Echo 08/21/17: EF 65-70% with Grade II DD and mildly elevated PA pressure of 33 mmHg - Echo 12/30/19: EF 65-70% with Grade II DD - Echo 03/29/23: EF 60-65% with mild LVH, Grade II DD - Has not received finerenone from the [place] yet. We will send out another rx.  - Currently on torsemide 60mg  BID; we will decrease to 60mg  at night and 20mg  in the morning due to loss of bladder control and inability to leave the  house. If she gains fluid, take additional 20-40mg .  - Repeat BMP/BNP today.   2: HTN- - Repeat BMP/BNP today - Well controlled now.   3: PAF- - continue toprol 25mg  daily - continue to follow with Dr. Okey Dupre. Wishes to move her appt with him. I have contacted his office today.   4: OSA- - saw pulmonology Craige Cotta) 08/24 - No issues.  - compliant with CPAP.   5: Rectal bleeding- - saw GI Gerre Pebbles) 08/24.  - Following with GI with plan to undergo colonoscopy soon.  6: Iron deficiency anemia- - saw hematology Donneta Romberg) 09/24 - hemoglobin 04/01/23 was 12.6  7: DM- - saw endocrinology Lurlean Leyden) 09/24 - A1c 03/21/23 was 7.6%  8. CKD IIIB - With underlying T2DM, HFpEF will continue finerenone 10mg .  - repeat BMP today.   9. Deconditioning - She will start physical therapy next week.    Dorthula Nettles, DO 07/01/23

## 2023-07-01 NOTE — Patient Instructions (Addendum)
Medication Changes:  DECREASE TORSEMIDE TO 20MG  IN THE MORNING AND 60MG  IN THE EVENING   YOU MAY TAKE AN EXTRA 20-40MG  DOSE OF TORSEMIDE AS NEEDED FOR FLUID GAIN   Lab Work:  Go over to the [facility]. Go pass the gift shop and have your blood work completed.  We will only call you if the results are abnormal or if the provider would like to make medication changes.   Follow-Up in: 2 MONTHS AS SCHEDULED   If you have any questions or concerns before your next appointment please send [place] a message through mychart or call our office at [phone] Monday-Friday 8 am-5 pm.   If you have an urgent need after hours on the weekend please call your Primary Cardiologist or the [facility] Clinic in [place] at [phone].   At the [facility], you and your health needs are our priority. We have a designated team specialized in the treatment of Heart Failure. This Care Team includes your primary Heart Failure Specialized Cardiologist (physician), Advanced Practice Providers (APPs- Physician Assistants and Nurse Practitioners), and Pharmacist who all work together to provide you with the care you need, when you need it.   You may see any of the following providers on your designated Care Team at your next follow up:  Dr. Arvilla Meres Dr. Marca Ancona Dr. Dorthula Nettles Dr. Theresia Bough Tonye Becket, NP Robbie Lis, [place] [address] [place], [place] Brynda Peon, NP [place] Lee, NP Clarisa Kindred, NP Enos Fling, PharmD

## 2023-07-03 ENCOUNTER — Ambulatory Visit: Payer: Medicare Other | Admitting: Internal Medicine

## 2023-07-04 NOTE — Patient Outreach (Signed)
  Care Coordination   07/04/2023 Name: Denise Sharp MRN: [id number] DOB: March 05, 1946   Care Coordination Outreach Attempts:  Message received from care guide Staci Ashworth, unable to re-establish contact with patient after several attempts.   Follow Up Plan:  No further outreach attempts will be made at this time. We have been unable to contact the patient to offer or enroll patient in complex care management services.  Encounter Outcome:  Completed.    Care Coordination Interventions:  No, not indicated    George Ina RN,BSN,CCM [facility]  [facility], [facility] coordinator / Case Manager Phone: [phone]

## 2023-07-06 DIAGNOSIS — I5033 Acute on chronic diastolic (congestive) heart failure: Secondary | ICD-10-CM | POA: Diagnosis not present

## 2023-07-08 DIAGNOSIS — I5022 Chronic systolic (congestive) heart failure: Secondary | ICD-10-CM

## 2023-07-08 NOTE — Progress Notes (Signed)
Talked with Bonita Quin when she arrived for her orientation with Pulmonary rehab on 07/08/23 from 2-3pm. Bonita Quin and I discussed her issues with balance, her knee replacements, her back, joint problems, incontinence, and history of falls. Mckaley is having major issues with her knee replacements and balance that limit her ability to perform exercise in our rehab program. Also with her incontinence issues of which she can not clean up independently, does not work with our rehab program. Based on this discussion, we found that Pulmonary Rehab is not a good fit for her at this time. I recommended that she look into home health physical therapy, under a new agency, as she had issues with her last therapist. We discussed in the interim maintaining home exercise with bands from her physical therapy to help build strength. I also recommended talking with her doctor about looking into home health physical therapy or other recommendations that would better fit her current situation and provider her benefit to improve strength, stamina, and shortness of breath. If she can show improvements from home health or other recommendations, we can reevaluate with a new order from her doctor in the future.

## 2023-07-24 NOTE — Progress Notes (Signed)
 [facility] Note   PCP: Marylynn Verneita CROME, MD (last seen 10/24) Cardiologist: Lonni Hanson, MD (last seen 12/24) HF provider: Gardenia Led, MD (last seen 12/24)  Chief Complaint: fatigue  HPI:  Denise Sharp is a 78 y/o female with a history of HFpEF, CAD status post PCI to the LAD, persistent atrial fibrillation, type 2 diabetes, CKD and morbid obesity presenting today for follow up.  Admitted 03/28/23 due to 30 pound weight gain requiring IV diuresis.  At that time she was taking Lasix  20 mg once daily to once every other day.  She was admitted to [facility] where echocardiogram demonstrated preserved EF with grade 2 diastolic dysfunction.  She was diuresed 4 L through admission and transition to torsemide  20 mg daily at discharge.  Throughout admission she maintained normal sinus rhythm.  She did have rectal bleeding that was secondary to hemorrhoids.  Echo 03/25/12: EF 55-65% with mild AR Echo 08/21/17: EF 65-70% with Grade II DD and mildly elevated PA pressure of 33 mmHg Echo 12/30/19: EF 65-70% with Grade II DD Echo 03/29/23: EF 60-65% with mild LVH, Grade II DD  RHC/ LHC 06/04/22: Mild-moderate, non-obstructive coronary artery disease, similar to prior catheterization in 08/2020. Widely patent ostial LAD stent. Patent mid LAD stent with minimal in stent restenosis (~10% narrowing). Upper normal left and right heart filling pressures. Borderline elevated pulmonary artery pressure. Normal to mildly reduced cardiac output/index.  She presents today for a HF follow-up visit with a chief complaint of moderate fatigue with exertion. Chronic in nature. Has associated dry cough, chronic dizziness, pedal edema, difficulty sleeping (due to urination) and swelling in hands (possibly due to arthritis) along with this. Denies shortness of breath, chest pain, cough, palpitations, abdominal distention or weight gain.   At last visit, her torsemide  was decreased to 20mg  AM/ 60mg  PM  with additional 20-40mg  during the day if needed. Has been taking 120mg  torsemide  daily usually 60mg  BID but will occasionally take less in AM/ more PM. Last night went to [employer] for supper. She says that she likes to take more of her dose in the evening because if she takes it in the morning she is urinating all the time and can't go anywhere.   Was told she couldn't participate in pulmonary rehab due to multiple orthopedic issues along with urinary incontinence. It was recommended that she look into home physical therapy.   Still wearing Zoll HF Management system device until 08/03/23 (90 days of wear time)  Past Medical History:  Diagnosis Date   (HFpEF) heart failure with preserved ejection fraction (HCC)    a. 12/2019 Echo: EF 65-70%, Gr2 DD. No significant valvular dzs.   Abnormality of gait 03/25/2013   Adrenal mass, left (HCC)    Anginal pain (HCC)    Anxiety    Aortic atherosclerosis (HCC)    Arthritis    Asthma    CAD (coronary artery disease) with h/o Atypical Chest Pain    a. 04/1986 Cath ([facility]): nl cors, EF 65%; b. 07/2012 Cath [facility]): Diff minor irregs; c. 07/2016 MV [facility]): Equivocal; d. 08/2016 Cardiac CT Ca2+ score [facility]): Ca2+ 1548; e. 05/2019 MV: No ischemia. EF 75%; f. 12/2019 PCI: LM nl, LAD 65ost (3.5x12 Resolute DES), 57m (2.75x12 Resolute DES),LCX/RCA nl; g. 08/2020 Cath: patent LAD stents, RCA 40ost, elev RH pressures, EF >65%.   Cervical spinal stenosis 1994   due to trauma to back ([employer] accident), has intermittent paralysis and parasthesias   Cervicogenic headache 03/23/2014  CKD (chronic kidney disease), stage III (HCC)    Depression    Diverticulosis    Dizziness    a.) chronic   DJD (degenerative joint disease)    a. Chronic R shoulder pain   Dyspnea    Esophageal stenosis 09/2009   a.) transient outlet obstruction by food, cleared by EGD   Family history of adverse reaction to anesthesia    a.) daughter with (+) PONV   Gastric bypass status for  obesity    GERD (gastroesophageal reflux disease)    Headache(784.0)    HLD (hyperlipidemia)    Hypertension    IBS (irritable bowel syndrome)    IDA (iron  deficiency anemia)    a.) post 2 unit txfsn 2009, normal endo/colonoscopy by Wohl   Left bundle branch block (LBBB)    a.) Intermittently present - likely rate related.   NSVT (nonsustained ventricular tachycardia) (HCC) 11/15/2020   a.) Holter study 11/15/2020; 11 episodes of NSVT lasting up to 8 beats with a maximum rate of 210 bpm; 14 atrial runs lasting up to 18 beats with a rate of up to 250 bpm.  Some atrial runs felt to be NSVT.   NSVT (nonsustained ventricular tachycardia) (HCC)    a. 10/2020 Zio: 11 runs of NSVT up to 8 beats.   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 10/2020 Zio: 14 atrial runs up to 18 beats, max HR 250.   Restless leg syndrome    Rotator cuff arthropathy, right 08/13/2017   Syncope and collapse 03/12/2014   Type II diabetes mellitus (HCC)     Current Outpatient Medications  Medication Sig Dispense Refill   apixaban  (ELIQUIS ) 5 MG TABS tablet Take 1 tablet (5 mg total) by mouth 2 (two) times daily. 180 tablet 1   atorvastatin  (LIPITOR ) 80 MG tablet Take 1 tablet (80 mg total) by mouth daily. 90 tablet 1   budesonide -formoterol  (SYMBICORT ) 160-4.5 MCG/ACT inhaler Inhale 2 puffs into the lungs 2 (two) times daily. 1 each 6   butalbital -acetaminophen -caffeine  (FIORICET ) 50-325-40 MG tablet TAKE 1 TABLET BY MOUTH EVERY 6 HOURS AS NEEDED FOR HEADACHE. 60 tablet 2   Cholecalciferol  (VITAMIN D3) 250 MCG (10000 UT) capsule Take 10,000 Units by mouth daily.     clotrimazole -betamethasone  (LOTRISONE ) cream Apply 1 application topically 2 (two) times daily as needed (irritation).     conjugated estrogens  (PREMARIN ) vaginal cream Apply one pea-sized amount around  the opening of the urethra daily for 2 weeks, then 3 times weekly moving forward. 30 g 4   Continuous Blood Gluc Receiver (FREESTYLE LIBRE 2 READER) DEVI Use to check sugar at least TID 1 each 1   cyanocobalamin  (VITAMIN B12) 1000 MCG/ML injection Inject 1 mL (1,000 mcg total) into the muscle every 30 (thirty) days. Inject 1 ml (1000 mcg ) IM weekly x 4,  Then monthly thereafter 3 mL 1   dapagliflozin  propanediol (FARXIGA ) 10 MG TABS tablet Take 1 tablet (10 mg total) by mouth daily. 90 tablet 3   dicyclomine  (BENTYL ) 20 MG tablet Take 1 tablet (20 mg total) by mouth 3 (three) times daily before meals. 180 tablet 2   DULoxetine  (CYMBALTA ) 60 MG capsule Take 1 capsule (60 mg total) by mouth daily. 90 capsule 3   ezetimibe  (ZETIA ) 10 MG tablet Take 1 tablet (10 mg total) by mouth daily. 90  tablet 3   fexofenadine (ALLEGRA) 180 MG tablet Take 180 mg by mouth as needed for allergies or rhinitis.     Finerenone  (KERENDIA ) 10 MG TABS Take 1 tablet (10 mg total) by mouth in the morning. (Patient not taking: Reported on 07/01/2023) 90 tablet 3   fluticasone  (FLONASE ) 50 MCG/ACT nasal spray Place 2 sprays into both nostrils daily. (Patient not taking: Reported on 07/01/2023) 16 g 6   Glucagon  (GVOKE HYPOPEN  2-PACK) 0.5 MG/0.1ML SOAJ Inject 1 auto injector subcutaneously as needed for severe hypoglycemia. May repeat x 1 after 15 minutes if needed. 0.1 mL 2   glucose blood (ONETOUCH VERIO) test strip Use to check blood sugar twice daily. 100 each 1   hydrocortisone  (ANUSOL -HC) 25 MG suppository Place 1 suppository (25 mg total) rectally 2 (two) times daily. (Patient not taking: Reported on 07/01/2023) 24 suppository 5   insulin  aspart (NOVOLOG  FLEXPEN) 100 UNIT/ML FlexPen Inject 10-16 Units into the skin 3 (three) times daily with meals. 36 mL 3   insulin  degludec (TRESIBA  FLEXTOUCH) 100 UNIT/ML FlexTouch Pen Inject 15 Units into the skin daily. 15 mL 3   Insulin  Pen Needle (DROPLET PEN NEEDLES) 31G X 5 MM MISC  USE ONE NEEDLE SUBCUTANEOUSLY AS DIRECTED (REMOVE AND DISCARD NEEDLE IN SHARPS CONTAINER IMMEDIATELY AFTER USE) 100 each 1   lipase/protease/amylase (CREON ) 36000 UNITS CPEP capsule Take 2 capsules (72,000 Units total) by mouth 3 (three) times daily before meals AND 1 capsule (36,000 Units total) with snacks. 300 capsule 9   loperamide  (IMODIUM ) 2 MG capsule Take 4-6 mg by mouth 3 (three) times daily before meals.     nitroGLYCERIN  (NITROSTAT ) 0.4 MG SL tablet Place 1 tablet (0.4 mg total) under the tongue every 5 (five) minutes as needed for chest pain. 25 tablet 5   nystatin  (MYCOSTATIN /NYSTOP ) powder Apply 1 g topically 4 (four) times daily as needed (rash).     ondansetron  (ZOFRAN -ODT) 4 MG disintegrating tablet Take 1 tablet (4 mg total) by mouth every 8 (eight) hours as needed for nausea or vomiting. 30 tablet 0   OneTouch Delica Lancets 33G MISC Used to check blood sugar two times a day. 100 each 4   OVER THE COUNTER MEDICATION Apply 1 application  topically daily as needed (pain). [place] Gel topical pain reliever     pantoprazole  (PROTONIX ) 40 MG tablet Take 1 tablet (40 mg total) by mouth daily. 90 tablet 3   Polyethyl Glycol-Propyl Glycol (SYSTANE OP) Place 1 drop into both eyes 4 (four) times daily as needed (dry eyes).     potassium chloride  20 MEQ/15ML (10%) SOLN Take 15 mLs (20 mEq total) by mouth daily. 473 mL 3   pregabalin  (LYRICA ) 50 MG capsule Take 1 capsule (50 mg total) by mouth 2 (two) times daily. 180 capsule 1   tiZANidine  (ZANAFLEX ) 4 MG tablet Take 1 tablet (4 mg total) by mouth every 6 (six) hours as needed for muscle spasms. 30 tablet 0   torsemide  (DEMADEX ) 20 MG tablet TAKE 20MG  IN THE MORNING AND 60MG  IN THE EVENING 120 tablet 2   No current facility-administered medications for this visit.    Allergies  Allergen Reactions   Demeclocycline Hives   Erythromycin Nausea And Vomiting and Other (See Comments)    Severe irritable bowel   Flagyl [Metronidazole] Nausea  And Vomiting and Other (See Comments)    Severe irritable bowel   Glucophage [Metformin Hcl] Nausea And Vomiting and Other (See Comments)    Sick I won't take  anything that has metformin in it   Tetracyclines & Related Hives and Rash   Potassium Chloride  Other (See Comments)    Cannot swallow potssium pills, able to tolerate liquid potassium   Tetracycline Other (See Comments)   Diovan [Valsartan] Nausea Only        Sulfa  Antibiotics Other (See Comments) and Rash    As child   Xanax [Alprazolam] Other (See Comments)    Hyperactivity       Social History   Socioeconomic History   Marital status: Married    Spouse name: Koren    Number of children: 2   Years of education: College    Highest education level: Not on file  Occupational History    Employer: retired  Tobacco Use   Smoking status: Never    Passive exposure: Never   Smokeless tobacco: Never  Vaping Use   Vaping status: Never Used  Substance and Sexual Activity   Alcohol  use: No   Drug use: Not Currently    Comment: prescribed valium    Sexual activity: Not Currently    Birth control/protection: Post-menopausal, Surgical  Other Topics Concern   Not on file  Social History Narrative   Patient lives at home with husband Koren.    Right Handed   Drinks caffeinated tea occasionally   One pet in house, dog   Social Drivers of Health   Financial Resource Strain: Low Risk  (09/15/2021)   Overall Financial Resource Strain (CARDIA)    Difficulty of Paying Living Expenses: Not hard at all  Food Insecurity: No Food Insecurity (04/05/2023)   Hunger Vital Sign    Worried About Running Out of Food in the Last Year: Never true    Ran Out of Food in the Last Year: Never true  Transportation Needs: No Transportation Needs (04/05/2023)   PRAPARE - Administrator, Civil Service (Medical): No    Lack of Transportation (Non-Medical): No  Physical Activity: Insufficiently Active (11/15/2020)   Exercise Vital Sign     Days of Exercise per Week: 2 days    Minutes of Exercise per Session: 40 min  Stress: Stress Concern Present (08/03/2020)   [employer] of Occupational Health - Occupational Stress Questionnaire    Feeling of Stress : Rather much  Social Connections: Not on file  Intimate Partner Violence: Not At Risk (03/28/2023)   Humiliation, Afraid, Rape, and Kick questionnaire    Fear of Current or Ex-Partner: No    Emotionally Abused: No    Physically Abused: No    Sexually Abused: No      Family History  Problem Relation Age of Onset   Heart disease Father    Hypertension Father    Prostate cancer Father    Stroke Father    Osteoporosis Father    Stroke Mother    Depression Mother    Headache Mother    Heart disease Mother    Thyroid  disease Mother    Hypertension Mother    Diabetes Daughter    Heart disease Daughter    Hypertension Daughter    Hypertension Son    Vitals:   07/25/23 1523  BP: (!) 117/56  Pulse: 79  SpO2: 100%  Weight: 245 lb (111.1 kg)   Wt Readings from Last 3 Encounters:  07/25/23 245 lb (111.1 kg)  07/01/23 244 lb 4 oz (110.8 kg)  06/24/23 252 lb (114.3 kg)   Lab Results  Component Value Date   CREATININE 1.72 (H) 07/01/2023  CREATININE 1.57 (H) 05/23/2023   CREATININE 1.66 (H) 04/29/2023   PHYSICAL EXAM:   GENERAL: Well nourished, well developed, and in no apparent distress at rest.  HEENT: Negative for arcus senilis or xanthelasma. There is no scleral icterus.  The mucous membranes are pink and moist.   NECK: Supple, No masses. Normal carotid upstrokes without bruits. No masses or thyromegaly.    CHEST: There are no chest wall deformities. There is no chest wall tenderness. Respirations are unlabored.  Lungs- cta b/l CARDIAC:  JVP: 7 cm          Normal rate with regular rhythm. No murmurs, rubs or gallops. Trace pitting edema.  ABDOMEN: Soft, non-tender, non-distended. There are no masses or hepatomegaly.  EXTREMITIES: Warm and well  perfused with no cyanosis, clubbing.  NEUROLOGIC: Patient is oriented x3 with no focal or lateralizing neurologic deficits.  PSYCH: Patients affect is appropriate, there is no evidence of anxiety or depression.  SKIN: Warm and dry; no lesions or wounds.   ECG: Not done   ASSESSMENT & PLAN:  1: NICM with preserved ejection fraction- - suspect due to obesity, atrial fibrillation, vascular disease, hypertension, CKD - NYHA class III: symptoms 2/2 deconditioning. - euvolemic - weight stable from last visit here 3 weeks ago - Echo 03/25/12: EF 55-65% with mild AR - Echo 08/21/17: EF 65-70% with Grade II DD and mildly elevated PA pressure of 33 mmHg - Echo 12/30/19: EF 65-70% with Grade II DD - Echo 03/29/23: EF 60-65% with mild LVH, Grade II DD - continue finerenone  10mg  daily - discussed taking the least amount of torsemide  needed to control her symptoms; she says that she's been taking 60mg  BID but understands to try doing 20mg  AM/ 60mg  PM - already has [facility] scheduled [facility] - still has zoll HF management system device on - BNP 07/01/23 was 40.6  2: HTN- - BP 117/56 - saw PCP Christophe) 10/24 - BMET 07/01/23 showed sodium 134, potassium 4.0, creatinine 1.72 & GFR 30 - BMET tomorrow w/ cancer center labs  3: PAF- - saw cardiology (Further) 12/24  4: OSA- - had video visit with pulmonology (Cobb) 10/24 - compliant with CPAP.   5: Rectal bleeding- - saw GI Elodie) 08/24.  - Following with GI with plan to undergo colonoscopy  6: Iron  deficiency anemia- - saw hematology Oza) 09/24 - hemoglobin 04/08/23 was 14.2  7: DM- - saw endocrinology Sherryll) 12/24 - A1c 06/24/23 was 9.5%  8. CKD IIIB - With underlying T2DM, HFpEF will continue finerenone  10mg .  - saw nephrology (Korrapati) 10/24  9. Deconditioning - she did not qualify for pulmonary rehab due to multiple orthopaedic issues along with severe incontinence - rehab recommended home  physical therapy instead - she is gathering personal recommendations regarding home PT as she doesn't want to use her former home health agency   Already has HF follow-up appt scheduled next month. Return sooner if needed.

## 2023-07-25 ENCOUNTER — Ambulatory Visit: Payer: Medicare Other | Attending: Family | Admitting: Family

## 2023-07-25 ENCOUNTER — Encounter: Payer: Self-pay | Admitting: Family

## 2023-07-25 VITALS — BP 117/56 | HR 79 | Wt 245.0 lb

## 2023-07-25 DIAGNOSIS — N1832 Chronic kidney disease, stage 3b: Secondary | ICD-10-CM | POA: Diagnosis not present

## 2023-07-25 DIAGNOSIS — E1122 Type 2 diabetes mellitus with diabetic chronic kidney disease: Secondary | ICD-10-CM | POA: Diagnosis not present

## 2023-07-25 DIAGNOSIS — R32 Unspecified urinary incontinence: Secondary | ICD-10-CM | POA: Insufficient documentation

## 2023-07-25 DIAGNOSIS — D631 Anemia in chronic kidney disease: Secondary | ICD-10-CM | POA: Insufficient documentation

## 2023-07-25 DIAGNOSIS — I10 Essential (primary) hypertension: Secondary | ICD-10-CM | POA: Diagnosis not present

## 2023-07-25 DIAGNOSIS — I130 Hypertensive heart and chronic kidney disease with heart failure and stage 1 through stage 4 chronic kidney disease, or unspecified chronic kidney disease: Secondary | ICD-10-CM | POA: Insufficient documentation

## 2023-07-25 DIAGNOSIS — R5383 Other fatigue: Secondary | ICD-10-CM | POA: Insufficient documentation

## 2023-07-25 DIAGNOSIS — R5381 Other malaise: Secondary | ICD-10-CM | POA: Diagnosis not present

## 2023-07-25 DIAGNOSIS — G4733 Obstructive sleep apnea (adult) (pediatric): Secondary | ICD-10-CM | POA: Diagnosis not present

## 2023-07-25 DIAGNOSIS — I480 Paroxysmal atrial fibrillation: Secondary | ICD-10-CM

## 2023-07-25 DIAGNOSIS — I5032 Chronic diastolic (congestive) heart failure: Secondary | ICD-10-CM | POA: Diagnosis not present

## 2023-07-25 DIAGNOSIS — D509 Iron deficiency anemia, unspecified: Secondary | ICD-10-CM | POA: Diagnosis not present

## 2023-07-25 DIAGNOSIS — I428 Other cardiomyopathies: Secondary | ICD-10-CM | POA: Insufficient documentation

## 2023-07-25 DIAGNOSIS — I2510 Atherosclerotic heart disease of native coronary artery without angina pectoris: Secondary | ICD-10-CM | POA: Diagnosis not present

## 2023-07-25 DIAGNOSIS — Z955 Presence of coronary angioplasty implant and graft: Secondary | ICD-10-CM | POA: Diagnosis not present

## 2023-07-25 DIAGNOSIS — N183 Chronic kidney disease, stage 3 unspecified: Secondary | ICD-10-CM

## 2023-07-25 DIAGNOSIS — Z7984 Long term (current) use of oral hypoglycemic drugs: Secondary | ICD-10-CM | POA: Insufficient documentation

## 2023-07-25 DIAGNOSIS — I4819 Other persistent atrial fibrillation: Secondary | ICD-10-CM | POA: Diagnosis not present

## 2023-07-25 DIAGNOSIS — K625 Hemorrhage of anus and rectum: Secondary | ICD-10-CM

## 2023-07-25 DIAGNOSIS — E1142 Type 2 diabetes mellitus with diabetic polyneuropathy: Secondary | ICD-10-CM | POA: Diagnosis not present

## 2023-07-25 DIAGNOSIS — Z79899 Other long term (current) drug therapy: Secondary | ICD-10-CM | POA: Insufficient documentation

## 2023-07-25 DIAGNOSIS — Z794 Long term (current) use of insulin: Secondary | ICD-10-CM | POA: Insufficient documentation

## 2023-07-25 MED ORDER — TORSEMIDE 20 MG PO TABS: 60.0000 mg | ORAL_TABLET | Freq: Two times a day (BID) | ORAL | 2 refills | Status: DC

## 2023-07-25 NOTE — Patient Instructions (Addendum)
 Keep Torsemide  at 60 mg ( 3 Tab) Twice daily   KEEP FOLLOW UP WITH DR. GARDENIA AS SCHEDULED.  At the [facility], you and your health needs are our priority. As part of our continuing mission to provide you with exceptional heart care, we have created designated Provider Care Teams. These Care Teams include your primary Cardiologist (physician) and Advanced Practice Providers (APPs- Physician Assistants and Nurse Practitioners) who all work together to provide you with the care you need, when you need it.   You may see any of the following providers on your designated Care Team at your next follow up: Dr Toribio Fuel Dr Ezra Shuck Dr. Ria Gardenia Dr. Morene Brownie Amy Lenetta, NP Caffie Shed, [place] [facility] [place], [place] Beckey Coe, NP Jordan Lee, NP Ellouise Class, NP Jaun Bash, PharmD   Please be sure to bring in all your medications bottles to every appointment.    Thank you for choosing [facility]

## 2023-07-26 ENCOUNTER — Inpatient Hospital Stay: Payer: Medicare Other

## 2023-07-26 ENCOUNTER — Inpatient Hospital Stay (HOSPITAL_BASED_OUTPATIENT_CLINIC_OR_DEPARTMENT_OTHER): Payer: Medicare Other | Admitting: Internal Medicine

## 2023-07-26 ENCOUNTER — Inpatient Hospital Stay: Payer: Medicare Other | Attending: Internal Medicine

## 2023-07-26 ENCOUNTER — Encounter: Payer: Self-pay | Admitting: Internal Medicine

## 2023-07-26 VITALS — BP 113/55 | HR 83 | Resp 18

## 2023-07-26 VITALS — BP 117/74 | HR 77 | Temp 96.1°F | Ht 64.0 in | Wt 245.7 lb

## 2023-07-26 DIAGNOSIS — E876 Hypokalemia: Secondary | ICD-10-CM | POA: Diagnosis not present

## 2023-07-26 DIAGNOSIS — E785 Hyperlipidemia, unspecified: Secondary | ICD-10-CM | POA: Diagnosis not present

## 2023-07-26 DIAGNOSIS — E669 Obesity, unspecified: Secondary | ICD-10-CM | POA: Insufficient documentation

## 2023-07-26 DIAGNOSIS — R350 Frequency of micturition: Secondary | ICD-10-CM | POA: Insufficient documentation

## 2023-07-26 DIAGNOSIS — I2510 Atherosclerotic heart disease of native coronary artery without angina pectoris: Secondary | ICD-10-CM | POA: Insufficient documentation

## 2023-07-26 DIAGNOSIS — Z6841 Body Mass Index (BMI) 40.0 and over, adult: Secondary | ICD-10-CM

## 2023-07-26 DIAGNOSIS — Z794 Long term (current) use of insulin: Secondary | ICD-10-CM | POA: Insufficient documentation

## 2023-07-26 DIAGNOSIS — E1122 Type 2 diabetes mellitus with diabetic chronic kidney disease: Secondary | ICD-10-CM | POA: Insufficient documentation

## 2023-07-26 DIAGNOSIS — N183 Chronic kidney disease, stage 3 unspecified: Secondary | ICD-10-CM | POA: Diagnosis not present

## 2023-07-26 DIAGNOSIS — D509 Iron deficiency anemia, unspecified: Secondary | ICD-10-CM

## 2023-07-26 DIAGNOSIS — G2581 Restless legs syndrome: Secondary | ICD-10-CM | POA: Insufficient documentation

## 2023-07-26 DIAGNOSIS — Z7901 Long term (current) use of anticoagulants: Secondary | ICD-10-CM | POA: Diagnosis not present

## 2023-07-26 DIAGNOSIS — Z79899 Other long term (current) drug therapy: Secondary | ICD-10-CM | POA: Diagnosis not present

## 2023-07-26 DIAGNOSIS — M1990 Unspecified osteoarthritis, unspecified site: Secondary | ICD-10-CM | POA: Insufficient documentation

## 2023-07-26 DIAGNOSIS — I509 Heart failure, unspecified: Secondary | ICD-10-CM | POA: Insufficient documentation

## 2023-07-26 DIAGNOSIS — K909 Intestinal malabsorption, unspecified: Secondary | ICD-10-CM | POA: Insufficient documentation

## 2023-07-26 DIAGNOSIS — Z8042 Family history of malignant neoplasm of prostate: Secondary | ICD-10-CM | POA: Insufficient documentation

## 2023-07-26 DIAGNOSIS — Z9884 Bariatric surgery status: Secondary | ICD-10-CM | POA: Diagnosis not present

## 2023-07-26 DIAGNOSIS — I130 Hypertensive heart and chronic kidney disease with heart failure and stage 1 through stage 4 chronic kidney disease, or unspecified chronic kidney disease: Secondary | ICD-10-CM | POA: Diagnosis not present

## 2023-07-26 DIAGNOSIS — R5383 Other fatigue: Secondary | ICD-10-CM | POA: Diagnosis not present

## 2023-07-26 DIAGNOSIS — R0600 Dyspnea, unspecified: Secondary | ICD-10-CM | POA: Insufficient documentation

## 2023-07-26 DIAGNOSIS — Z7984 Long term (current) use of oral hypoglycemic drugs: Secondary | ICD-10-CM | POA: Insufficient documentation

## 2023-07-26 DIAGNOSIS — K589 Irritable bowel syndrome without diarrhea: Secondary | ICD-10-CM | POA: Insufficient documentation

## 2023-07-26 DIAGNOSIS — E538 Deficiency of other specified B group vitamins: Secondary | ICD-10-CM | POA: Diagnosis not present

## 2023-07-26 DIAGNOSIS — E1142 Type 2 diabetes mellitus with diabetic polyneuropathy: Secondary | ICD-10-CM | POA: Diagnosis not present

## 2023-07-26 DIAGNOSIS — E559 Vitamin D deficiency, unspecified: Secondary | ICD-10-CM

## 2023-07-26 DIAGNOSIS — J45909 Unspecified asthma, uncomplicated: Secondary | ICD-10-CM | POA: Insufficient documentation

## 2023-07-26 DIAGNOSIS — G4733 Obstructive sleep apnea (adult) (pediatric): Secondary | ICD-10-CM | POA: Diagnosis not present

## 2023-07-26 DIAGNOSIS — E1165 Type 2 diabetes mellitus with hyperglycemia: Secondary | ICD-10-CM | POA: Diagnosis not present

## 2023-07-26 DIAGNOSIS — E611 Iron deficiency: Secondary | ICD-10-CM | POA: Diagnosis not present

## 2023-07-26 LAB — CMP (CANCER CENTER ONLY)
ALT: 18 U/L (ref 0–44)
AST: 22 U/L (ref 15–41)
Albumin: 3.1 g/dL — ABNORMAL LOW (ref 3.5–5.0)
Alkaline Phosphatase: 115 U/L (ref 38–126)
Anion gap: 12 (ref 5–15)
BUN: 31 mg/dL — ABNORMAL HIGH (ref 8–23)
CO2: 26 mmol/L (ref 22–32)
Calcium: 8.4 mg/dL — ABNORMAL LOW (ref 8.9–10.3)
Chloride: 98 mmol/L (ref 98–111)
Creatinine: 1.53 mg/dL — ABNORMAL HIGH (ref 0.44–1.00)
GFR, Estimated: 35 mL/min — ABNORMAL LOW (ref >60)
Glucose, Bld: 325 mg/dL — ABNORMAL HIGH (ref 70–99)
Potassium: 3.7 mmol/L (ref 3.5–5.1)
Sodium: 136 mmol/L (ref 135–145)
Total Bilirubin: 0.6 mg/dL (ref 0.0–1.2)
Total Protein: 6.9 g/dL (ref 6.5–8.1)

## 2023-07-26 LAB — CBC WITH DIFFERENTIAL (CANCER CENTER ONLY)
Abs Immature Granulocytes: 0.15 10*3/uL — ABNORMAL HIGH (ref 0.00–0.07)
Basophils Absolute: 0.1 10*3/uL (ref 0.0–0.1)
Basophils Relative: 1 %
Eosinophils Absolute: 0.1 10*3/uL (ref 0.0–0.5)
Eosinophils Relative: 1 %
HCT: 39.3 % (ref 36.0–46.0)
Hemoglobin: 12.8 g/dL (ref 12.0–15.0)
Immature Granulocytes: 1 %
Lymphocytes Relative: 21 %
Lymphs Abs: 2.3 10*3/uL (ref 0.7–4.0)
MCH: 30.5 pg (ref 26.0–34.0)
MCHC: 32.6 g/dL (ref 30.0–36.0)
MCV: 93.6 fL (ref 80.0–100.0)
Monocytes Absolute: 0.6 10*3/uL (ref 0.1–1.0)
Monocytes Relative: 6 %
Neutro Abs: 7.3 10*3/uL (ref 1.7–7.7)
Neutrophils Relative %: 70 %
Platelet Count: 249 10*3/uL (ref 150–400)
RBC: 4.2 MIL/uL (ref 3.87–5.11)
RDW: 14.4 % (ref 11.5–15.5)
WBC Count: 10.5 10*3/uL (ref 4.0–10.5)
nRBC: 0 % (ref 0.0–0.2)

## 2023-07-26 LAB — VITAMIN D 25 HYDROXY (VIT D DEFICIENCY, FRACTURES): Vit D, 25-Hydroxy: 52.04 ng/mL (ref 30–100)

## 2023-07-26 LAB — VITAMIN B12: Vitamin B-12: 252 pg/mL (ref 180–914)

## 2023-07-26 MED ORDER — SODIUM CHLORIDE 0.9 % IV SOLN
Freq: Once | INTRAVENOUS | Status: AC
  Filled 2023-07-26: qty 250

## 2023-07-26 MED ORDER — SODIUM CHLORIDE 0.9 % IV SOLN
510.0000 mg | Freq: Once | INTRAVENOUS | Status: AC
  Administered 2023-07-26: 510 mg via INTRAVENOUS
  Filled 2023-07-26: qty 510

## 2023-07-26 NOTE — Progress Notes (Signed)
 Pt tolerated treatment without concerns.  VSS.  Pt refused 30 minute post observation.  Pt understands risks.

## 2023-07-26 NOTE — Assessment & Plan Note (Addendum)
#   Iron  deficiency/secondary to gastric bypass/malabsorption: Symptomatic anemia hemoglobin ferritin 12 saturation 5% hemoglobin 10 [November 2021].   # hemoglobin today is 12.8  Iron  sat- 15%; Proceed with Feraheme  today-see below. CT scan: MAY 2024- No acute process.  However, esophageal thickening ? Stricture [aug 2021]- [? EGD]s/p Dr.Wohl STABLE/better.   # Francee- Weight gain/bloated/ poor appetite- Sec to CHF vs others. [Dr.End]-on Torsemide - follow up with heart failure clinic.   # Hypokalemia- 3.5-continue K supplementation.   # DM- poor controlled BG 325- recommend follow up with PCP.   # B12 deficiency secondary gastric bypass at home iron  injections- stable   # DISPOSITION: # Ferrahem today # follow up 4 month; MD: labs- cbc/cmp; B12 levels;Vit D 25-OH;   possible Ferrahem- Dr.B

## 2023-07-26 NOTE — Progress Notes (Signed)
 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 Finnegan] EGD/colonoscopy- ?2021 [Dr.Wohl]; NOV 2021-hemoglobin 10.  Iron  saturation 5% ferritin 12. EGD- NOV 2022- Dr.Wohl.  # GASTRIC BYPASS x2 [2005]; CAD/CHF [s/p stent; Dr.End]; CKD stage III [Dr.Korrapati]; poorly controlled diabetes- [Dr.Tullo]  HISTORY OF PRESENTING ILLNESS: Alone.  Walking with a rolling walker.  Denise Sharp 78 y.o.  female with multiple medical comorbidities including CHF diuretics hypokalemia iron  deficiency secondary gastric bypass, poorly controlled DM- is here for follow-up.  Patient complains of ongoing fatigue.  Chronic mild shortness of breath-attributable to CHF.  Followed closely by CHF clinic.  Complains of increased frequency of urination attributable to her diuretics.  Denies any blood in stools or black-colored stools.   Review of Systems  Constitutional:  Positive for malaise/fatigue. Negative for chills, diaphoresis, fever and weight loss.  HENT:  Negative for nosebleeds and sore throat.   Eyes:  Negative for double vision.  Respiratory:  Negative for cough, hemoptysis, sputum production, shortness of breath and wheezing.   Cardiovascular:  Negative for chest pain, palpitations, orthopnea and leg swelling.  Gastrointestinal:  Positive for nausea. Negative for abdominal pain, blood in stool, constipation, heartburn, melena and vomiting.  Genitourinary:  Negative for dysuria, frequency and urgency.  Musculoskeletal:  Positive for back pain, joint pain and myalgias.  Skin:  Negative.  Negative for itching and rash.  Neurological:  Negative for dizziness, tingling, focal weakness, weakness and headaches.  Endo/Heme/Allergies:  Does not bruise/bleed easily.  Psychiatric/Behavioral:  Negative for depression. The patient is not nervous/anxious and does not have insomnia.     MEDICAL HISTORY:  Past Medical History:  Diagnosis Date   (HFpEF) heart failure with preserved ejection fraction (HCC)    a. 12/2019 Echo: EF 65-70%, Gr2 DD. No significant valvular dzs.   Abnormality of gait 03/25/2013   Adrenal mass, left (HCC)    Anginal pain (HCC)    Anxiety    Aortic atherosclerosis (HCC)    Arthritis    Asthma    CAD (coronary artery disease) with h/o Atypical Chest Pain    a. 04/1986 Cath ([facility]): nl cors, EF 65%; b. 07/2012 Cath [facility]): Diff minor irregs; c. 07/2016 MV [facility]): Equivocal; d. 08/2016 Cardiac CT Ca2+ score [facility]): Ca2+ 1548; e. 05/2019 MV: No ischemia. EF 75%; f. 12/2019 PCI: LM nl, LAD 65ost (3.5x12 Resolute DES), 34m (2.75x12 Resolute DES),LCX/RCA nl; g. 08/2020 Cath: patent LAD stents, RCA 40ost, elev RH pressures, EF >65%.   Cervical spinal stenosis 1994   due to trauma to back ([employer] accident), has intermittent paralysis and parasthesias   Cervicogenic headache 03/23/2014   CKD (chronic kidney disease), stage III (HCC)    Depression    Diverticulosis    Dizziness    a.) chronic   DJD (degenerative joint disease)    a. Chronic R shoulder pain   Dyspnea    Esophageal stenosis 09/2009   a.) transient outlet obstruction by food, cleared by EGD   Family history of adverse reaction to anesthesia    a.) daughter with (+) PONV   Gastric bypass status for obesity    GERD (gastroesophageal reflux disease)  Headache(784.0)    HLD (hyperlipidemia)    Hypertension    IBS (irritable bowel syndrome)    IDA (iron  deficiency anemia)    a.) post 2 unit txfsn 2009, normal endo/colonoscopy by [facility]   Left bundle branch block (LBBB)    a.) Intermittently  present - likely rate related.   NSVT (nonsustained ventricular tachycardia) (HCC) 11/15/2020   a.) Holter study 11/15/2020; 11 episodes of NSVT lasting up to 8 beats with a maximum rate of 210 bpm; 14 atrial runs lasting up to 18 beats with a rate of up to 250 bpm.  Some atrial runs felt to be NSVT.   NSVT (nonsustained ventricular tachycardia) (HCC)    a. 10/2020 [facility]: 11 runs of NSVT up to 8 beats.   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 10/2020 [facility]: 14 atrial runs up to 18 beats, max HR 250.   Restless leg syndrome    Rotator cuff arthropathy, right 08/13/2017   Syncope and collapse 03/12/2014   Type II diabetes mellitus (HCC)     SURGICAL HISTORY: Past Surgical History:  Procedure Laterality Date   APPENDECTOMY     BACK SURGERY     CARDIOVERSION N/A 10/30/2022   Procedure: CARDIOVERSION;  Surgeon: Mady Bruckner, MD;  Location: [facility] ORS;  Service: Cardiovascular;  Laterality: N/A;   CHOLECYSTECTOMY N/A 1976   COLONOSCOPY     CORONARY ANGIOPLASTY     CORONARY STENT INTERVENTION N/A 01/04/2020   Procedure: CORONARY STENT INTERVENTION;  Surgeon: Darron Deatrice LABOR, MD;  Location: [facility] INVASIVE CV LAB;  Service: Cardiovascular;  Laterality: N/A;  LAD    DIAPHRAGMATIC HERNIA REPAIR  2015   ESOPHAGEAL DILATION     multiple   ESOPHAGOGASTRODUODENOSCOPY (EGD) WITH PROPOFOL  N/A 05/11/2021   Procedure: ESOPHAGOGASTRODUODENOSCOPY (EGD) WITH PROPOFOL ;  Surgeon: Jinny Carmine, MD;  Location: [facility] ENDOSCOPY;  Service: Endoscopy;  Laterality: N/A;   ESOPHAGOGASTRODUODENOSCOPY (EGD) WITH PROPOFOL  N/A 06/13/2021   Procedure: ESOPHAGOGASTRODUODENOSCOPY (EGD) WITH PROPOFOL ;  Surgeon: Jinny Carmine, MD;  Location: [facility] ENDOSCOPY;  Service: Endoscopy;  Laterality: N/A;   ESOPHAGOGASTRODUODENOSCOPY (EGD) WITH PROPOFOL  N/A 02/28/2023   Procedure:  ESOPHAGOGASTRODUODENOSCOPY (EGD) WITH PROPOFOL ;  Surgeon: Jinny Carmine, MD;  Location: [facility] ENDOSCOPY;  Service: Endoscopy;  Laterality: N/A;   EYE SURGERY     GASTRIC BYPASS  2000, 2005   Dr. Mia REDBIRD IMPLANT PLACEMENT  10/2011   Ike REDBIRD IMPLANT PLACEMENT N/A 09/18/2021   Procedure: REDBIRD IMPLANT FIRST STAGE;  Surgeon: Gaston Hamilton, MD;  Location: [facility] ORS;  Service: Urology;  Laterality: N/A;   INTERSTIM IMPLANT PLACEMENT N/A 09/18/2021   Procedure: REDBIRD IMPLANT SECOND STAGE WITH IMPEDENCE CHECK;  Surgeon: Gaston Hamilton, MD;  Location: [facility] OR[facility];  Service: Urology;  Laterality: N/A;   INTERSTIM IMPLANT REMOVAL N/A 09/18/2021   Procedure: REMOVAL OF INTERSTIM IMPLANT;  Surgeon: Gaston Hamilton, MD;  Location: [facility] OR[facility];  Service: Urology;  Laterality: N/A;   LEFT HEART CATH AND CORONARY ANGIOGRAPHY N/A 04/1986   LEFT HEART CATH AND CORONARY ANGIOGRAPHY Left 07/24/2012   LEFT HEART CATH AND CORS/GRAFTS ANGIOGRAPHY N/A 12/31/2019   Procedure: LEFT HEART CATH AND CORS/GRAFTS ANGIOGRAPHY;  Surgeon: Perla Evalene PARAS, MD;  Location: [facility];  Service: Cardiovascular;  Laterality: N/A;   PANNICULECTOMY N/A 06/16/2019   Procedure: PANNICULECTOMY;  Surgeon: Elisabeth Craig RAMAN, MD;  Location: [facility];  Service: Plastics;  Laterality: N/A;  3 hours, please   REVERSE SHOULDER ARTHROPLASTY Right 08/13/2017   Procedure: REVERSE RIGHT SHOULDER ARTHROPLASTY;  Surgeon: Josefina Chew, MD;  Location: [facility];  Service: Orthopedics;  Laterality: Right;   RIGHT/LEFT HEART CATH AND CORONARY ANGIOGRAPHY N/A 09/06/2020   Procedure: RIGHT/LEFT HEART CATH AND CORONARY ANGIOGRAPHY;  Surgeon: Mady Bruckner, MD;  Location: [facility];  Service: Cardiovascular;  Laterality: N/A;   RIGHT/LEFT HEART CATH AND CORONARY ANGIOGRAPHY N/A 06/04/2022   Procedure: RIGHT/LEFT HEART CATH AND CORONARY ANGIOGRAPHY;  Surgeon: Mady Bruckner, MD;  Location: [facility];   Service: Cardiovascular;  Laterality: N/A;   ROTATOR CUFF REPAIR Right    SPINE SURGERY  1995   [place]   TOOTH EXTRACTION  11/13/2021   TOTAL ABDOMINAL HYSTERECTOMY W/ BILATERAL SALPINGOOPHORECTOMY  1974   TOTAL KNEE ARTHROPLASTY Bilateral 2007   Procedure: TOTAL KNEE ARTHROPLASTY; Surgeons: Kathi, MD and [name], MD   UMBILICAL HERNIA REPAIR  08/11/2015    SOCIAL HISTORY: Social History   Socioeconomic History   Marital status: Married    Spouse name: Koren    Number of children: 2   Years of education: College    Highest education level: Not on file  Occupational History    Employer: retired  Tobacco Use   Smoking status: Never    Passive exposure: Never   Smokeless tobacco: Never  Vaping Use   Vaping status: Never Used  Substance and Sexual Activity   Alcohol  use: No   Drug use: Not Currently    Comment: prescribed valium    Sexual activity: Not Currently    Birth control/protection: Post-menopausal, Surgical  Other Topics Concern   Not on file  Social History Narrative   Patient lives at home with husband Koren.    Right Handed   Drinks caffeinated tea occasionally   One pet in house, dog   Social Drivers of Health   Financial Resource Strain: Low Risk  (09/15/2021)   Overall Financial Resource Strain (CARDIA)    Difficulty of Paying Living Expenses: Not hard at all  Food Insecurity: No Food Insecurity (04/05/2023)   Hunger Vital Sign    Worried About Running Out of Food in the Last Year: Never true    Ran Out of Food in the Last Year: Never true  Transportation Needs: No Transportation Needs (04/05/2023)   PRAPARE - Administrator, Civil Service (Medical): No    Lack of Transportation (Non-Medical): No  Physical Activity: Insufficiently Active (11/15/2020)   Exercise Vital Sign    Days of Exercise per Week: 2 days    Minutes of Exercise per Session: 40 min  Stress: Stress Concern Present (08/03/2020)   [employer] of Occupational Health -  Occupational Stress Questionnaire    Feeling of Stress : Rather much  Social Connections: Not on file  Intimate Partner Violence: Not At Risk (03/28/2023)   Humiliation, Afraid, Rape, and Kick questionnaire    Fear of Current or Ex-Partner: No    Emotionally Abused: No    Physically Abused: No    Sexually Abused: No    FAMILY HISTORY: Family History  Problem Relation Age of Onset   Heart disease Father    Hypertension Father    Prostate cancer Father    Stroke Father    Osteoporosis Father    Stroke Mother    Depression Mother    Headache Mother    Heart disease Mother  Thyroid  disease Mother    Hypertension Mother    Diabetes Daughter    Heart disease Daughter    Hypertension Daughter    Hypertension Son     ALLERGIES:  is allergic to demeclocycline, erythromycin, flagyl [metronidazole], glucophage [metformin hcl], tetracyclines & related, potassium chloride , tetracycline, diovan [valsartan], sulfa  antibiotics, and xanax [alprazolam].  MEDICATIONS:  Current Outpatient Medications  Medication Sig Dispense Refill   apixaban  (ELIQUIS ) 5 MG TABS tablet Take 1 tablet (5 mg total) by mouth 2 (two) times daily. 180 tablet 1   atorvastatin  (LIPITOR ) 80 MG tablet Take 1 tablet (80 mg total) by mouth daily. 90 tablet 1   budesonide -formoterol  (SYMBICORT ) 160-4.5 MCG/ACT inhaler Inhale 2 puffs into the lungs 2 (two) times daily. 1 each 6   butalbital -acetaminophen -caffeine  (FIORICET ) 50-325-40 MG tablet TAKE 1 TABLET BY MOUTH EVERY 6 HOURS AS NEEDED FOR HEADACHE. 60 tablet 2   Cholecalciferol  (VITAMIN D3) 250 MCG (10000 UT) capsule Take 10,000 Units by mouth daily.     clotrimazole -betamethasone  (LOTRISONE ) cream Apply 1 application topically 2 (two) times daily as needed (irritation).     conjugated estrogens  (PREMARIN ) vaginal cream Apply one pea-sized amount around the opening of the urethra daily for 2 weeks, then 3 times weekly moving forward. 30 g 4   Continuous Blood Gluc  Receiver (FREESTYLE LIBRE 2 READER) DEVI Use to check sugar at least TID 1 each 1   cyanocobalamin  (VITAMIN B12) 1000 MCG/ML injection Inject 1 mL (1,000 mcg total) into the muscle every 30 (thirty) days. Inject 1 ml (1000 mcg ) IM weekly x 4,  Then monthly thereafter 3 mL 1   dapagliflozin  propanediol (FARXIGA ) 10 MG TABS tablet Take 1 tablet (10 mg total) by mouth daily. 90 tablet 3   dicyclomine  (BENTYL ) 20 MG tablet Take 1 tablet (20 mg total) by mouth 3 (three) times daily before meals. 180 tablet 2   DULoxetine  (CYMBALTA ) 60 MG capsule Take 1 capsule (60 mg total) by mouth daily. 90 capsule 3   ezetimibe  (ZETIA ) 10 MG tablet Take 1 tablet (10 mg total) by mouth daily. 90 tablet 3   fexofenadine (ALLEGRA) 180 MG tablet Take 180 mg by mouth as needed for allergies or rhinitis.     Finerenone  (KERENDIA ) 10 MG TABS Take 1 tablet (10 mg total) by mouth in the morning. 90 tablet 3   fluticasone  (FLONASE ) 50 MCG/ACT nasal spray Place 2 sprays into both nostrils daily. 16 g 6   Glucagon  (GVOKE HYPOPEN  2-PACK) 0.5 MG/0.1ML SOAJ Inject 1 auto injector subcutaneously as needed for severe hypoglycemia. May repeat x 1 after 15 minutes if needed. 0.1 mL 2   glucose blood (ONETOUCH VERIO) test strip Use to check blood sugar twice daily. 100 each 1   hydrocortisone  (ANUSOL -HC) 25 MG suppository Place 1 suppository (25 mg total) rectally 2 (two) times daily. 24 suppository 5   insulin  aspart (NOVOLOG  FLEXPEN) 100 UNIT/ML FlexPen Inject 10-16 Units into the skin 3 (three) times daily with meals. 36 mL 3   insulin  degludec (TRESIBA  FLEXTOUCH) 100 UNIT/ML FlexTouch Pen Inject 15 Units into the skin daily. 15 mL 3   Insulin  Pen Needle (DROPLET PEN NEEDLES) 31G X 5 MM MISC USE ONE NEEDLE SUBCUTANEOUSLY AS DIRECTED (REMOVE AND DISCARD NEEDLE IN SHARPS CONTAINER IMMEDIATELY AFTER USE) 100 each 1   lipase/protease/amylase (CREON ) 36000 UNITS CPEP capsule Take 2 capsules (72,000 Units total) by mouth 3 (three) times  daily before meals AND 1 capsule (36,000 Units total) with snacks. 300 capsule  9   loperamide  (IMODIUM ) 2 MG capsule Take 4-6 mg by mouth 3 (three) times daily before meals.     nitroGLYCERIN  (NITROSTAT ) 0.4 MG SL tablet Place 1 tablet (0.4 mg total) under the tongue every 5 (five) minutes as needed for chest pain. 25 tablet 5   nystatin  (MYCOSTATIN /NYSTOP ) powder Apply 1 g topically 4 (four) times daily as needed (rash).     ondansetron  (ZOFRAN -ODT) 4 MG disintegrating tablet Take 1 tablet (4 mg total) by mouth every 8 (eight) hours as needed for nausea or vomiting. 30 tablet 0   OneTouch Delica Lancets 33G MISC Used to check blood sugar two times a day. 100 each 4   OVER THE COUNTER MEDICATION Apply 1 application  topically daily as needed (pain). [place] Gel topical pain reliever     pantoprazole  (PROTONIX ) 40 MG tablet Take 1 tablet (40 mg total) by mouth daily. 90 tablet 3   Polyethyl Glycol-Propyl Glycol (SYSTANE OP) Place 1 drop into both eyes 4 (four) times daily as needed (dry eyes).     potassium chloride  20 MEQ/15ML (10%) SOLN Take 15 mLs (20 mEq total) by mouth daily. 473 mL 3   pregabalin  (LYRICA ) 50 MG capsule Take 1 capsule (50 mg total) by mouth 2 (two) times daily. 180 capsule 1   tiZANidine  (ZANAFLEX ) 4 MG tablet Take 1 tablet (4 mg total) by mouth every 6 (six) hours as needed for muscle spasms. 30 tablet 0   torsemide  (DEMADEX ) 20 MG tablet Take 3 tablets (60 mg total) by mouth 2 (two) times daily. 180 tablet 2   No current facility-administered medications for this visit.   Facility-Administered Medications Ordered in Other Visits  Medication Dose Route Frequency Provider Last Rate Last Admin   ferumoxytol  (FERAHEME ) 510 mg in sodium chloride  0.9 % 100 mL IVPB  510 mg Intravenous Once Amarri Satterly R, MD 468 mL/hr at 07/26/23 1105 510 mg at 07/26/23 1105      PHYSICAL EXAMINATION:   Vitals:   07/26/23 0937  BP: 117/74  Pulse: 77  Temp: (!) 96.1 F (35.6 C)   SpO2: 97%     Filed Weights   07/26/23 0937  Weight: 245 lb 11.2 oz (111.4 kg)      Physical Exam Constitutional:      Comments: Obese.  HENT:     Head: Normocephalic and atraumatic.     Mouth/Throat:     Pharynx: No oropharyngeal exudate.  Eyes:     Pupils: Pupils are equal, round, and reactive to light.  Cardiovascular:     Rate and Rhythm: Normal rate and regular rhythm.     Pulses: Normal pulses.     Heart sounds: Normal heart sounds.  Pulmonary:     Effort: Pulmonary effort is normal. No respiratory distress.     Breath sounds: Normal breath sounds. No wheezing.  Abdominal:     General: Bowel sounds are normal. There is no distension.     Palpations: Abdomen is soft. There is no mass.     Tenderness: There is no abdominal tenderness. There is no guarding or rebound.  Musculoskeletal:        General: No tenderness. Normal range of motion.     Cervical back: Normal range of motion and neck supple.  Skin:    General: Skin is warm.  Neurological:     Mental Status: She is alert and oriented to person, place, and time.  Psychiatric:        Mood and Affect: Affect  normal.     LABORATORY DATA:  I have reviewed the data as listed Lab Results  Component Value Date   WBC 10.5 07/26/2023   HGB 12.8 07/26/2023   HCT 39.3 07/26/2023   MCV 93.6 07/26/2023   PLT 249 07/26/2023   Recent Labs    12/14/22 0038 02/13/23 1228 03/26/23 1430 03/28/23 1213 03/29/23 0443 05/23/23 1329 07/01/23 1315 07/26/23 0950  NA  --    < > 140 142   < > 133* 134* 136  K  --    < > 3.2* 3.0*   < > 3.7 4.0 3.7  CL  --    < > 109 106   < > 95* 94* 98  CO2  --    < > 22 22   < > 26 26 26   GLUCOSE  --    < > 144* 102*   < > 238* 269* 325*  BUN  --    < > 10 8   < > 29* 45* 31*  CREATININE  --    < > 1.31* 1.03*   < > 1.57* 1.72* 1.53*  CALCIUM   --    < > 7.9* 8.5*   < > 8.5* 8.7* 8.4*  GFRNONAA  --    < > 42* 56*   < > 34* 30* 35*  PROT 7.0  --  6.5 7.3  --   --   --  6.9   ALBUMIN  3.3*  --  3.2* 3.6  --   --   --  3.1*  AST 25  --  18 19  --   --   --  22  ALT 22  --  16 16  --   --   --  18  ALKPHOS 127*  --  109 125  --   --   --  115  BILITOT 0.5  --  0.5 1.0  --   --   --  0.6  BILIDIR 0.1  --   --   --   --   --   --   --   IBILI 0.4  --   --   --   --   --   --   --    < > = values in this interval not displayed.     No results found.  Iron  deficiency # Iron  deficiency/secondary to gastric bypass/malabsorption: Symptomatic anemia hemoglobin ferritin 12 saturation 5% hemoglobin 10 [November 2021].   # hemoglobin today is 12.8  Iron  sat- 15%; Proceed with Feraheme  today-see below. CT scan: MAY 2024- No acute process.  However, esophageal thickening ? Stricture [aug 2021]- [? EGD]s/p Dr.Wohl STABLE/better.   # [place]- Weight gain/bloated/ poor appetite- Sec to CHF vs others. [Dr.End]-on Torsemide - follow up with heart failure clinic.   # Hypokalemia- 3.5-continue K supplementation.   # DM- poor controlled BG 325- recommend follow up with PCP.   # B12 deficiency secondary gastric bypass at home iron  injections- stable   # DISPOSITION: # Ferrahem today # follow up 4 month; MD: labs- cbc/cmp; B12 levels;Vit D 25-OH;   possible Ferrahem- Dr.B   All questions were answered. The patient knows to call the clinic with any problems, questions or concerns.    Denise JONELLE Joe, MD 07/26/2023 11:12 AM

## 2023-07-26 NOTE — Patient Instructions (Signed)
 Ferumoxytol Injection What is this medication? FERUMOXYTOL (FER ue MOX i tol) treats low levels of iron in your body (iron deficiency anemia). Iron is a mineral that plays an important role in making red blood cells, which carry oxygen from your lungs to the rest of your body. This medicine may be used for other purposes; ask your health care provider or pharmacist if you have questions. COMMON BRAND NAME(S): Feraheme What should I tell my care team before I take this medication? They need to know if you have any of these conditions: Anemia not caused by low iron levels High levels of iron in the blood Magnetic resonance imaging (MRI) test scheduled An unusual or allergic reaction to iron, other medications, foods, dyes, or preservatives Pregnant or trying to get pregnant Breastfeeding How should I use this medication? This medication is injected into a vein. It is given by your care team in a hospital or clinic setting. Talk to your care team the use of this medication in children. Special care may be needed. Overdosage: If you think you have taken too much of this medicine contact a poison control center or emergency room at once. NOTE: This medicine is only for you. Do not share this medicine with others. What if I miss a dose? It is important not to miss your dose. Call your care team if you are unable to keep an appointment. What may interact with this medication? Other iron products This list may not describe all possible interactions. Give your health care provider a list of all the medicines, herbs, non-prescription drugs, or dietary supplements you use. Also tell them if you smoke, drink alcohol, or use illegal drugs. Some items may interact with your medicine. What should I watch for while using this medication? Visit your care team regularly. Tell your care team if your symptoms do not start to get better or if they get worse. You may need blood work done while you are taking this  medication. You may need to follow a special diet. Talk to your care team. Foods that contain iron include: whole grains/cereals, dried fruits, beans, or peas, leafy green vegetables, and organ meats (liver, kidney). What side effects may I notice from receiving this medication? Side effects that you should report to your care team as soon as possible: Allergic reactions--skin rash, itching, hives, swelling of the face, lips, tongue, or throat Low blood pressure--dizziness, feeling faint or lightheaded, blurry vision Shortness of breath Side effects that usually do not require medical attention (report to your care team if they continue or are bothersome): Flushing Headache Joint pain Muscle pain Nausea Pain, redness, or irritation at injection site This list may not describe all possible side effects. Call your doctor for medical advice about side effects. You may report side effects to FDA at 1-800-FDA-1088. Where should I keep my medication? This medication is given in a hospital or clinic. It will not be stored at home. NOTE: This sheet is a summary. It may not cover all possible information. If you have questions about this medicine, talk to your doctor, pharmacist, or health care provider.  2024 Elsevier/Gold Standard (2022-12-07 00:00:00)

## 2023-07-26 NOTE — Progress Notes (Signed)
 Fatigue/weakness: yes Dyspena: yes Light headedness: no Dizziness: yes Blood in stool: no  Fell around 07/05/23, no injuries, balance not as good.  Since last visit dx with part of pancrease not working.  No bowel or urine control.  Having trouble swallowing solids and liquids.  DOE.

## 2023-07-30 ENCOUNTER — Other Ambulatory Visit: Payer: Self-pay | Admitting: Internal Medicine

## 2023-07-30 NOTE — Telephone Encounter (Signed)
 Copied from CRM [id number]. Topic: Clinical - Medication Refill >> Jul 30, 2023  9:11 AM Russell PARAS wrote: Most Recent Primary Care Visit:  Provider: MARYLYNN VERNEITA CROME  Department: [facility]  Visit Type: HOSPITAL FU  Date: 04/29/2023  Medication: pregabalin  (LYRICA ) 50 MG ca   Has the patient contacted their pharmacy? Yes (Agent: If no, request that the patient contact the pharmacy for the refill. If patient does not wish to contact the pharmacy document the reason why and proceed with request.) (Agent: If yes, when and what did the pharmacy advise?)  Is this the correct pharmacy for this prescription? Yes If no, delete pharmacy and type the correct one.  This is the patient's preferred pharmacy:   [facility] - [place], [place] - [address] [place] [address] Phone: [phone] Fax: [phone]   Has the prescription been filled recently? Yes  Is the patient out of the medication? No  Has the patient been seen for an appointment in the last year OR does the patient have an upcoming appointment? Yes  Can we respond through MyChart? Yes  Agent: Please be advised that Rx refills may take up to 3 business days. We ask that you follow-up with your pharmacy.

## 2023-08-01 MED ORDER — PREGABALIN 50 MG PO CAPS: 50.0000 mg | ORAL_CAPSULE | Freq: Two times a day (BID) | ORAL | 1 refills | Status: AC

## 2023-08-01 MED ORDER — PREGABALIN 50 MG PO CAPS: 50.0000 mg | ORAL_CAPSULE | Freq: Two times a day (BID) | ORAL | 1 refills | Status: DC

## 2023-08-05 DIAGNOSIS — E113393 Type 2 diabetes mellitus with moderate nonproliferative diabetic retinopathy without macular edema, bilateral: Secondary | ICD-10-CM | POA: Diagnosis not present

## 2023-08-29 ENCOUNTER — Ambulatory Visit (INDEPENDENT_AMBULATORY_CARE_PROVIDER_SITE_OTHER): Payer: Medicare Other

## 2023-08-29 ENCOUNTER — Encounter: Payer: Self-pay | Admitting: Internal Medicine

## 2023-08-29 ENCOUNTER — Ambulatory Visit
Admission: EM | Admit: 2023-08-29 | Discharge: 2023-08-29 | Disposition: A | Discharge status: Home / Self Care | Payer: Medicare Other | Attending: Family Medicine | Admitting: Family Medicine

## 2023-08-29 DIAGNOSIS — M79645 Pain in left finger(s): Secondary | ICD-10-CM

## 2023-08-29 DIAGNOSIS — M85842 Other specified disorders of bone density and structure, left hand: Secondary | ICD-10-CM | POA: Diagnosis not present

## 2023-08-29 DIAGNOSIS — M19042 Primary osteoarthritis, left hand: Secondary | ICD-10-CM | POA: Diagnosis not present

## 2023-08-29 DIAGNOSIS — M069 Rheumatoid arthritis, unspecified: Secondary | ICD-10-CM | POA: Diagnosis not present

## 2023-08-29 MED ORDER — DEXAMETHASONE SODIUM PHOSPHATE 10 MG/ML IJ SOLN
10.0000 mg | Freq: Once | INTRAMUSCULAR | Status: AC
  Administered 2023-08-29: 10 mg via INTRAMUSCULAR

## 2023-08-29 MED ORDER — CEPHALEXIN 500 MG PO CAPS: 500.0000 mg | ORAL_CAPSULE | Freq: Three times a day (TID) | ORAL | 0 refills | Status: DC

## 2023-08-29 NOTE — ED Provider Notes (Addendum)
[facility]    CSN: [id number] Arrival date & time: 08/29/23  1448      History   Chief Complaint Chief Complaint  Patient presents with   Hand Pain    HPI  HPI Denise Sharp is a 78 y.o. female.   Denise Sharp presents for left index finger pain, redness and swelling that started last week.  Daughter reports its symptoms are getting worse.  Denies any injuries, falls or trauma.  She is unsure what kind of arthritis she has but knows that she has arthritis.  She is right-handed.  She has not been able to do knit like she normally does due to her hand pain.  Reports grandmother had rheumatoid arthritis.  No treatments prior to arrival.     Past Medical History:  Diagnosis Date   (HFpEF) heart failure with preserved ejection fraction (HCC)    a. 12/2019 Echo: EF 65-70%, Gr2 DD. No significant valvular dzs.   Abnormality of gait 03/25/2013   Adrenal mass, left (HCC)    Anginal pain (HCC)    Anxiety    Aortic atherosclerosis (HCC)    Arthritis    Asthma    CAD (coronary artery disease) with h/o Atypical Chest Pain    a. 04/1986 Cath ([facility]): nl cors, EF 65%; b. 07/2012 Cath [facility]): Diff minor irregs; c. 07/2016 MV [facility]): "Equivocal"; d. 08/2016 Cardiac CT Ca2+ score [facility]): Ca2+ 1548; e. 05/2019 MV: No ischemia. EF 75%; f. 12/2019 PCI: LM nl, LAD 65ost (3.5x12 Resolute DES), 50m (2.75x12 Resolute DES),LCX/RCA nl; g. 08/2020 Cath: patent LAD stents, RCA 40ost, elev RH pressures, EF >65%.   Cervical spinal stenosis 1994   due to trauma to back ([employer] accident), has intermittent paralysis and parasthesias   Cervicogenic headache 03/23/2014   CKD (chronic kidney disease), stage III (HCC)    Depression    Diverticulosis    Dizziness    a.) chronic   DJD (degenerative joint disease)    a. Chronic R shoulder pain   Dyspnea    Esophageal stenosis 09/2009   a.) transient outlet obstruction by food, cleared by EGD   Family history of adverse reaction to anesthesia    a.)  daughter with (+) PONV   Gastric bypass status for obesity    GERD (gastroesophageal reflux disease)    Headache(784.0)    HLD (hyperlipidemia)    Hypertension    IBS (irritable bowel syndrome)    IDA (iron deficiency anemia)    a.) post 2 unit txfsn 2009, normal endo/colonoscopy by [facility]   Left bundle branch block (LBBB)    a.) Intermittently present - likely rate related.   NSVT (nonsustained ventricular tachycardia) (HCC) 11/15/2020   a.) Holter study 11/15/2020; 11 episodes of NSVT lasting up to 8 beats with a maximum rate of 210 bpm; 14 atrial runs lasting up to 18 beats with a rate of up to 250 bpm.  Some atrial runs felt to be NSVT.   NSVT (nonsustained ventricular tachycardia) (HCC)    a. 10/2020 [facility]: 11 runs of NSVT up to 8 beats.   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 10/2020 [facility]: 14 atrial runs up to 18 beats, max HR 250.   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   Persistent atrial fibrillation (HCC) 09/27/2022   Hypoglycemia due to insulin 09/24/2022   COVID-19 virus infection 09/01/2022   Abdominal bloating 07/25/2022   Cerebral vascular disease 06/11/2022   Respiratory illness 04/11/2022   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   Orthostatic dizziness 04/03/2022   Gait abnormality 04/03/2022   Hx of amaurosis fugax 02/06/2022   Protein-calorie malnutrition (HCC) 11/21/2021   Absolute anemia 10/19/2021   Chronic neck pain 09/12/2021   PAC (premature atrial contraction) 08/19/2021       08/03/2021   Stricture and stenosis of esophagus     Pain in joint of right knee 05/17/2021   Sprain of right ankle 05/17/2021   Sprain of right knee 05/17/2021   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/09/2021   PSVT (paroxysmal supraventricular tachycardia) (HCC) 12/15/2020   CKD stage 3 due to type 2 diabetes mellitus (HCC) 12/09/2020   Other pulmonary embolism with acute cor pulmonale, unspecified chronicity (HCC) 11/17/2020   Mild episode of recurrent major depressive disorder (HCC) 11/17/2020   Hyperlipidemia associated with type 2 diabetes mellitus (HCC) 08/21/2020   Iron deficiency 06/28/2020   Elevated alkaline phosphatase level 06/10/2020   Chronic heart failure with preserved ejection fraction (HFpEF) (HCC) 01/14/2020   Coronary artery disease of native artery of native heart with stable angina pectoris (HCC) 01/13/2020   S/P cardiac catheterization 01/13/2020   Accelerating angina (HCC) 12/31/2019   Dysphagia 11/30/2019   Bladder pain 08/23/2019   Gastritis 08/07/2019   Lab test negative for COVID-19 virus 08/07/2019   S/P panniculectomy 06/16/2019   Major depressive disorder with current active episode 05/30/2019   Functional diarrhea 10/22/2018   Bilateral cataracts 06/26/2018   History of gastric bypass 02/05/2018   GAD (generalized anxiety disorder) 02/01/2018   Rectocele 01/24/2018   Vaginal prolapse 01/04/2018   Hepatic steatosis 01/04/2018   Microalbuminuria due to type 2 diabetes mellitus (HCC) 11/21/2017   Hospital discharge follow-up 09/22/2017   Fecal incontinence 09/22/2017   B12 deficiency 09/22/2017   Rotator cuff arthropathy, right 08/13/2017   Primary localized osteoarthrosis of shoulder 08/13/2017   Encounter for preoperative examination for general surgical procedure 08/03/2017   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) 08/03/2017   Candidiasis, intertrigo 08/03/2017   Venous stasis dermatitis of right lower extremity 11/21/2016   Chronic anxiety 10/11/2015   Hypersomnia, persistent  06/18/2015   Mechanical breakdown of implanted electronic neurostimulator of peripheral nerve (HCC) 10/16/2014   Obesity hypoventilation syndrome (HCC) 08/13/2014   Frequent falls 06/23/2014   Chronic cough 05/05/2014   Adenoma of left adrenal gland 03/24/2014   Chronic intractable headache 03/23/2014   Syncope and collapse 03/12/2014   Vitamin D deficiency 01/06/2014   Rectal bleeding 12/03/2013   Weight gain following gastric bypass surgery 12/03/2013   Morbid obesity (HCC) 08/25/2013   Weakness 08/04/2013   Diaphragmatic hernia 04/06/2013   Diabetic polyneuropathy (HCC) 03/25/2013   Abnormality of gait 03/25/2013   Multiple pulmonary nodules 03/20/2013   DOE (dyspnea on exertion) 03/20/2013   Iron deficiency anemia 08/06/2012   Functional disorder of bladder 07/15/2012   Urge incontinence 07/15/2012   Detrusor overactivity 07/15/2012   OSA on CPAP 03/02/2012   Insomnia secondary to anxiety 02/29/2012   Sciatica 09/05/2011   Cervical stenosis of spinal canal 06/14/2011   Restless legs  syndrome 03/13/2011   Essential hypertension 03/12/2011   Type II diabetes mellitus with neurological manifestations (HCC) 03/12/2011   Hearing loss, right 03/12/2011    Past Surgical History:  Procedure Laterality Date   APPENDECTOMY     BACK SURGERY     CARDIOVERSION N/A 10/30/2022   Procedure: CARDIOVERSION;  Surgeon: [name], MD;  Location: [facility];  Service: Cardiovascular;  Laterality: N/A;   CHOLECYSTECTOMY N/A 1976   COLONOSCOPY     CORONARY ANGIOPLASTY     CORONARY STENT INTERVENTION N/A 01/04/2020   Procedure: CORONARY STENT INTERVENTION;  Surgeon: Iran Ouch, MD;  Location: [facility];  Service: Cardiovascular;  Laterality: N/A;  LAD    DIAPHRAGMATIC HERNIA REPAIR  2015   ESOPHAGEAL DILATION     multiple   ESOPHAGOGASTRODUODENOSCOPY (EGD) WITH PROPOFOL N/A 05/11/2021   Procedure: ESOPHAGOGASTRODUODENOSCOPY (EGD) WITH PROPOFOL;  Surgeon: [name],  MD;  Location: [facility];  Service: Endoscopy;  Laterality: N/A;   ESOPHAGOGASTRODUODENOSCOPY (EGD) WITH PROPOFOL N/A 06/13/2021   Procedure: ESOPHAGOGASTRODUODENOSCOPY (EGD) WITH PROPOFOL;  Surgeon: [name], MD;  Location: [facility];  Service: Endoscopy;  Laterality: N/A;   ESOPHAGOGASTRODUODENOSCOPY (EGD) WITH PROPOFOL N/A 02/28/2023   Procedure: ESOPHAGOGASTRODUODENOSCOPY (EGD) WITH PROPOFOL;  Surgeon: [name], MD;  Location: [facility];  Service: Endoscopy;  Laterality: N/A;   EYE SURGERY     GASTRIC BYPASS  2000, 2005   Dr. Arlana Pouch IMPLANT PLACEMENT  10/2011   Jeannette How IMPLANT PLACEMENT N/A 09/18/2021   Procedure: Leane Platt IMPLANT FIRST STAGE;  Surgeon: Alfredo Martinez, MD;  Location: [facility];  Service: Urology;  Laterality: N/A;   INTERSTIM IMPLANT PLACEMENT N/A 09/18/2021   Procedure: Leane Platt IMPLANT SECOND STAGE WITH IMPEDENCE CHECK;  Surgeon: Alfredo Martinez, MD;  Location: [facility];  Service: Urology;  Laterality: N/A;   INTERSTIM IMPLANT REMOVAL N/A 09/18/2021   Procedure: REMOVAL OF INTERSTIM IMPLANT;  Surgeon: Alfredo Martinez, MD;  Location: [facility];  Service: Urology;  Laterality: N/A;   LEFT HEART CATH AND CORONARY ANGIOGRAPHY N/A 04/1986   LEFT HEART CATH AND CORONARY ANGIOGRAPHY Left 07/24/2012   LEFT HEART CATH AND CORS/GRAFTS ANGIOGRAPHY N/A 12/31/2019   Procedure: LEFT HEART CATH AND CORS/GRAFTS ANGIOGRAPHY;  Surgeon: Antonieta Iba, MD;  Location: [facility];  Service: Cardiovascular;  Laterality: N/A;   PANNICULECTOMY N/A 06/16/2019   Procedure: PANNICULECTOMY;  Surgeon: Allena Napoleon, MD;  Location: [facility];  Service: Plastics;  Laterality: N/A;  3 hours, please   REVERSE SHOULDER ARTHROPLASTY Right 08/13/2017   Procedure: REVERSE RIGHT SHOULDER ARTHROPLASTY;  Surgeon: Teryl Lucy, MD;  Location: [facility];  Service: Orthopedics;  Laterality: Right;   RIGHT/LEFT HEART CATH AND CORONARY ANGIOGRAPHY N/A  09/06/2020   Procedure: RIGHT/LEFT HEART CATH AND CORONARY ANGIOGRAPHY;  Surgeon: [name], MD;  Location: [facility];  Service: Cardiovascular;  Laterality: N/A;   RIGHT/LEFT HEART CATH AND CORONARY ANGIOGRAPHY N/A 06/04/2022   Procedure: RIGHT/LEFT HEART CATH AND CORONARY ANGIOGRAPHY;  Surgeon: [name], MD;  Location: [facility];  Service: Cardiovascular;  Laterality: N/A;   ROTATOR CUFF REPAIR Right    SPINE SURGERY  1995   [place]   TOOTH EXTRACTION  11/13/2021   TOTAL ABDOMINAL HYSTERECTOMY W/ BILATERAL SALPINGOOPHORECTOMY  1974   TOTAL KNEE ARTHROPLASTY Bilateral 2007   Procedure: TOTAL KNEE ARTHROPLASTY; Surgeons: Gerrit Heck, MD and [name], MD   UMBILICAL HERNIA REPAIR  08/11/2015    OB History     Rhett Bannister  2   Para  2   Term  2   Preterm      AB      Living  2      SAB      IAB      Ectopic      Multiple      Live Births  2            Home Medications    Prior to Admission medications   Medication Sig Start Date End Date Taking? Authorizing Provider  apixaban (ELIQUIS) 5 MG TABS tablet Take 1 tablet (5 mg total) by mouth 2 (two) times daily. 06/19/23  Yes End, Cristal Deer, MD  atorvastatin (LIPITOR) 80 MG tablet Take 1 tablet (80 mg total) by mouth daily. 04/29/23  Yes Sherlene Shams, MD  budesonide-formoterol (SYMBICORT) 160-4.5 MCG/ACT inhaler Inhale 2 puffs into the lungs 2 (two) times daily. 03/07/23  Yes Coralyn Helling, MD  butalbital-acetaminophen-caffeine (FIORICET) 50-325-40 MG tablet TAKE 1 TABLET BY MOUTH EVERY 6 HOURS AS NEEDED FOR HEADACHE. 07/23/22  Yes Sherlene Shams, MD  cephALEXin (KEFLEX) 500 MG capsule Take 1 capsule (500 mg total) by mouth 3 (three) times daily. 08/29/23  Yes Daylan Boggess, Seward Meth, DO  Cholecalciferol (VITAMIN D3) 250 MCG (10000 UT) capsule Take 10,000 Units by mouth daily.   Yes [provider]  clotrimazole-betamethasone (LOTRISONE) cream Apply 1 application topically 2 (two) times daily  as needed (irritation).   Yes [provider]  conjugated estrogens (PREMARIN) vaginal cream Apply one pea-sized amount around the opening of the urethra daily for 2 weeks, then 3 times weekly moving forward. 04/20/22  Yes Vaillancourt, Lelon Mast, PA-C  Continuous Blood Gluc Receiver (FREESTYLE LIBRE 2 READER) DEVI Use to check sugar at least TID 09/26/20  Yes Sherlene Shams, MD  cyanocobalamin (VITAMIN B12) 1000 MCG/ML injection Inject 1 mL (1,000 mcg total) into the muscle every 30 (thirty) days. Inject 1 ml (1000 mcg ) IM weekly x 4,  Then monthly thereafter 01/25/23  Yes Sherlene Shams, MD  dapagliflozin propanediol (FARXIGA) 10 MG TABS tablet Take 1 tablet (10 mg total) by mouth daily. 11/12/22  Yes Sherlene Shams, MD  dicyclomine (BENTYL) 20 MG tablet Take 1 tablet (20 mg total) by mouth 3 (three) times daily before meals. 08/07/22  Yes [name], MD  DULoxetine (CYMBALTA) 60 MG capsule Take 1 capsule (60 mg total) by mouth daily. 05/01/23  Yes Glean Salvo, NP  ezetimibe (ZETIA) 10 MG tablet Take 1 tablet (10 mg total) by mouth daily. 01/29/23 02/25/24 Yes End, Cristal Deer, MD  fexofenadine (ALLEGRA) 180 MG tablet Take 180 mg by mouth as needed for allergies or rhinitis.   Yes [provider]  Finerenone (KERENDIA) 10 MG TABS Take 1 tablet (10 mg total) by mouth in the morning. 04/24/23  Yes Sabharwal, Aditya, DO  fluticasone (FLONASE) 50 MCG/ACT nasal spray Place 2 sprays into both nostrils daily. 04/11/22  Yes Glori Luis, MD  Glucagon (GVOKE HYPOPEN 2-PACK) 0.5 MG/0.1ML SOAJ Inject 1 auto injector subcutaneously as needed for severe hypoglycemia. May repeat x 1 after 15 minutes if needed. 09/25/22  Yes Bethanie Dicker, NP  glucose blood (ONETOUCH VERIO) test strip Use to check blood sugar twice daily. 02/23/22  Yes Sherlene Shams, MD  hydrocortisone (ANUSOL-HC) 25 MG suppository Place 1 suppository (25 mg total) rectally 2 (two) times daily. 04/01/23  Yes Wouk, Wilfred Curtis,  MD  insulin aspart (NOVOLOG FLEXPEN) 100 UNIT/ML FlexPen Inject 10-16 Units  into the skin 3 (three) times daily with meals. 06/24/23  Yes Reardon, Freddi Starr, NP  insulin degludec (TRESIBA FLEXTOUCH) 100 UNIT/ML FlexTouch Pen Inject 15 Units into the skin daily. 06/24/23  Yes Reardon, Alphonzo Lemmings J, NP  Insulin Pen Needle (DROPLET PEN NEEDLES) 31G X 5 MM MISC USE ONE NEEDLE SUBCUTANEOUSLY AS DIRECTED (REMOVE AND DISCARD NEEDLE IN SHARPS CONTAINER IMMEDIATELY AFTER USE) 02/12/22  Yes Sherlene Shams, MD  lipase/protease/amylase (CREON) 36000 UNITS CPEP capsule Take 2 capsules (72,000 Units total) by mouth 3 (three) times daily before meals AND 1 capsule (36,000 Units total) with snacks. 03/08/23 03/02/24 Yes Celso Amy, PA-C  loperamide (IMODIUM) 2 MG capsule Take 4-6 mg by mouth 3 (three) times daily before meals.   Yes [provider]  nitroGLYCERIN (NITROSTAT) 0.4 MG SL tablet Place 1 tablet (0.4 mg total) under the tongue every 5 (five) minutes as needed for chest pain. 04/14/23  Yes Sherlene Shams, MD  nystatin (MYCOSTATIN/NYSTOP) powder Apply 1 g topically 4 (four) times daily as needed (rash).   Yes [provider]  ondansetron (ZOFRAN-ODT) 4 MG disintegrating tablet Take 1 tablet (4 mg total) by mouth every 8 (eight) hours as needed for nausea or vomiting. 07/13/22  Yes Sherlene Shams, MD  OneTouch Delica Lancets 33G MISC Used to check blood sugar two times a day. 02/22/22  Yes Sherlene Shams, MD  OVER THE COUNTER MEDICATION Apply 1 application  topically daily as needed (pain). [place] Gel topical pain reliever   Yes [provider]  pantoprazole (PROTONIX) 40 MG tablet Take 1 tablet (40 mg total) by mouth daily. 03/04/23 02/27/24 Yes Celso Amy, PA-C  Polyethyl Glycol-Propyl Glycol (SYSTANE OP) Place 1 drop into both eyes 4 (four) times daily as needed (dry eyes).   Yes [provider]  potassium chloride 20 MEQ/15ML (10%) SOLN Take 15 mLs (20 mEq total) by mouth  daily. 05/15/23  Yes End, Cristal Deer, MD  pregabalin (LYRICA) 50 MG capsule Take 1 capsule (50 mg total) by mouth 2 (two) times daily. 08/01/23  Yes Sherlene Shams, MD  tiZANidine (ZANAFLEX) 4 MG tablet Take 1 tablet (4 mg total) by mouth every 6 (six) hours as needed for muscle spasms. 04/29/23  Yes Sherlene Shams, MD  torsemide (DEMADEX) 20 MG tablet Take 3 tablets (60 mg total) by mouth 2 (two) times daily. 07/25/23  Yes Delma Freeze, FNP    Family History Family History  Problem Relation Age of Onset   Heart disease Father    Hypertension Father    Prostate cancer Father    Stroke Father    Osteoporosis Father    Stroke Mother    Depression Mother    Headache Mother    Heart disease Mother    Thyroid disease Mother    Hypertension Mother    Diabetes Daughter    Heart disease Daughter    Hypertension Daughter    Hypertension Son     Social History Social History   Tobacco Use   Smoking status: Never    Passive exposure: Never   Smokeless tobacco: Never  Vaping Use   Vaping status: Never Used  Substance Use Topics   Alcohol use: No   Drug use: Not Currently    Comment: prescribed valium     Allergies   Demeclocycline, Erythromycin, Flagyl [metronidazole], Glucophage [metformin hcl], Tetracyclines & related, Potassium chloride, Tetracycline, Diovan [valsartan], Sulfa antibiotics, and Xanax [alprazolam]   Review of Systems Review of Systems: :negative  unless otherwise stated in HPI.      Physical Exam Triage Vital Signs ED Triage Vitals  Encounter Vitals Group     BP 08/29/23 1540 122/64     Systolic BP Percentile --      Diastolic BP Percentile --      Pulse Rate 08/29/23 1540 78     Resp 08/29/23 1540 17     Temp 08/29/23 1540 98.8 F (37.1 C)     Temp Source 08/29/23 1540 Oral     SpO2 08/29/23 1540 99 %     Weight --      Height --      Head Circumference --      Peak Flow --      Pain Score 08/29/23 1539 8     Pain Loc --      Pain  Education --      Exclude from Growth Chart --    No data found.  Updated Vital Signs BP 122/64 (BP Location: Left Arm)   Pulse 78   Temp 98.8 F (37.1 C) (Oral)   Resp 17   SpO2 99%   Visual Acuity Right Eye Distance:   Left Eye Distance:   Bilateral Distance:    Right Eye Near:   Left Eye Near:    Bilateral Near:     Physical Exam GEN: well appearing female in no acute distress  CVS: well perfused  RESP: speaking in full sentences without pause, no respiratory distress  MSK:   Left Hand: Inspection: Has PIP nodes but no DIP nodes bilateraly hands, 2nd finger on left with PIP joint tenderness, erythema and edema extending to her 2nd and 3rd MCP joints  Palpation: DIP index finger TTP, bilateral CMC joint tenderness  Limited ROM of 2nd finger due to edema otherwise no edema in PIP, DIP joints b/l.  Flexor digitorum profundus and superficialis tendon functions are intact.  PIP joint collateral ligaments are stable  Strength: 5/5 strength in the forearm, wrist and interosseus muscles b/l Neurovascular: NV intact b/l SKIN: warm and dry      UC Treatments / Results  Labs (all labs ordered are listed, but only abnormal results are displayed) Labs Reviewed - No data to display  EKG   Radiology DG Finger Index Left Result Date: 08/29/2023 CLINICAL DATA:  Redness, pain and swelling of the right index finger. EXAM: LEFT INDEX FINGER 2+V COMPARISON:  None Available. FINDINGS: There is no acute fracture or dislocation. The bones are osteopenic. There is degenerative changes of the interphalangeal joint with spurring. There is diffuse soft tissue swelling of the index finger. No radiopaque foreign object or soft tissue gas. IMPRESSION: 1. No acute fracture or dislocation. 2. Diffuse soft tissue swelling of the index finger. Electronically Signed   By: Elgie Collard M.D.   On: 08/29/2023 17:20     Procedures Procedures (including critical care time)  Medications Ordered  in UC Medications  dexamethasone (DECADRON) injection 10 mg (10 mg Intramuscular Given 08/29/23 1704)    Initial Impression / Assessment and Plan / UC Course  I have reviewed the triage vital signs and the nursing notes.  Pertinent labs & imaging results that were available during my care of the patient were reviewed by me and considered in my medical decision making (see chart for details).      Pt is a 78 y.o.  female with RA who presents for over a week of left index finger pain and edema that is getting  worse. On exam, pt has tenderness at DIP and MCP joint of the index finger concerning for possible fracture.   Obtained index finger plain films.  Personally interpreted by me were remarkable bony arthritis but no fracture or dislocation seen. Radiologist report reviewed. Pt placed in static finger splint. Given Decadron 10 mg IM for suspected RA flare as she had bilateral hand pain but worse in the index finger. Keflex prescribed for possible cellulitis.   Patient to gradually return to normal activities, as tolerated and continue ordinary activities within the limits permitted by pain.  Voltaren gel and Tylenol PRN.   Patient to follow up with rheumatologic provider, if symptoms do not improve with conservative treatment.  Return and ED precautions given. Understanding voiced. Discussed MDM, treatment plan and plan for follow-up with patient and her daughter who agrees with plan.   Final Clinical Impressions(s) / UC Diagnoses   Final diagnoses:  Finger pain, left  Rheumatoid arthritis flare [facility])     Discharge Instructions      Stop by the pharmacy to pick up your antibiotics.  Follow up with your primary care provider to discuss seeing a rheumatologist.  Consider Dr Sheliah Hatch in [place].   Monitor your blood sugars as you were given a steroid injection here. Adjust your insulin based on your sliding scale.     ED Prescriptions     Medication Sig Dispense Auth.  Provider   cephALEXin (KEFLEX) 500 MG capsule Take 1 capsule (500 mg total) by mouth 3 (three) times daily. 21 capsule Katha Cabal, DO      PDMP not reviewed this encounter.       Katha Cabal, DO 08/29/23 1933

## 2023-08-29 NOTE — Discharge Instructions (Addendum)
Stop by the pharmacy to pick up your antibiotics.  Follow up with your primary care provider to discuss seeing a rheumatologist.  Consider Dr Sheliah Hatch in [place].   Monitor your blood sugars as you were given a steroid injection here. Adjust your insulin based on your sliding scale.

## 2023-08-29 NOTE — ED Triage Notes (Signed)
Patient noticed left pointing finger swelling last week. Has gotten worse.

## 2023-09-06 ENCOUNTER — Telehealth: Payer: Self-pay | Admitting: Cardiology

## 2023-09-06 NOTE — Telephone Encounter (Signed)
Pt confirmed appt for 09/09/23

## 2023-09-09 ENCOUNTER — Encounter: Payer: Medicare Other | Admitting: Cardiology

## 2023-09-30 ENCOUNTER — Encounter: Payer: Self-pay | Admitting: Nurse Practitioner

## 2023-09-30 ENCOUNTER — Ambulatory Visit (INDEPENDENT_AMBULATORY_CARE_PROVIDER_SITE_OTHER): Payer: Medicare Other | Admitting: Nurse Practitioner

## 2023-09-30 VITALS — BP 102/80 | HR 76 | Ht 64.0 in | Wt 244.0 lb

## 2023-09-30 DIAGNOSIS — E1165 Type 2 diabetes mellitus with hyperglycemia: Secondary | ICD-10-CM

## 2023-09-30 DIAGNOSIS — E785 Hyperlipidemia, unspecified: Secondary | ICD-10-CM | POA: Diagnosis not present

## 2023-09-30 DIAGNOSIS — Z9884 Bariatric surgery status: Secondary | ICD-10-CM | POA: Diagnosis not present

## 2023-09-30 DIAGNOSIS — R609 Edema, unspecified: Secondary | ICD-10-CM | POA: Diagnosis not present

## 2023-09-30 DIAGNOSIS — E1142 Type 2 diabetes mellitus with diabetic polyneuropathy: Secondary | ICD-10-CM | POA: Diagnosis not present

## 2023-09-30 DIAGNOSIS — I509 Heart failure, unspecified: Secondary | ICD-10-CM | POA: Diagnosis not present

## 2023-09-30 DIAGNOSIS — E782 Mixed hyperlipidemia: Secondary | ICD-10-CM

## 2023-09-30 DIAGNOSIS — R3914 Feeling of incomplete bladder emptying: Secondary | ICD-10-CM | POA: Diagnosis not present

## 2023-09-30 DIAGNOSIS — I10 Essential (primary) hypertension: Secondary | ICD-10-CM

## 2023-09-30 DIAGNOSIS — E1129 Type 2 diabetes mellitus with other diabetic kidney complication: Secondary | ICD-10-CM | POA: Diagnosis not present

## 2023-09-30 DIAGNOSIS — D508 Other iron deficiency anemias: Secondary | ICD-10-CM | POA: Diagnosis not present

## 2023-09-30 DIAGNOSIS — D500 Iron deficiency anemia secondary to blood loss (chronic): Secondary | ICD-10-CM | POA: Diagnosis not present

## 2023-09-30 DIAGNOSIS — E559 Vitamin D deficiency, unspecified: Secondary | ICD-10-CM

## 2023-09-30 DIAGNOSIS — Z794 Long term (current) use of insulin: Secondary | ICD-10-CM

## 2023-09-30 DIAGNOSIS — G4733 Obstructive sleep apnea (adult) (pediatric): Secondary | ICD-10-CM | POA: Diagnosis not present

## 2023-09-30 DIAGNOSIS — R809 Proteinuria, unspecified: Secondary | ICD-10-CM | POA: Diagnosis not present

## 2023-09-30 DIAGNOSIS — N1832 Chronic kidney disease, stage 3b: Secondary | ICD-10-CM | POA: Diagnosis not present

## 2023-09-30 DIAGNOSIS — E1122 Type 2 diabetes mellitus with diabetic chronic kidney disease: Secondary | ICD-10-CM | POA: Diagnosis not present

## 2023-09-30 LAB — POCT GLYCOSYLATED HEMOGLOBIN (HGB A1C): Hemoglobin A1C: 9.3 % — AB (ref 4.0–5.6)

## 2023-09-30 LAB — PROTEIN / CREATININE RATIO, URINE: Creatinine, Urine: 53

## 2023-09-30 LAB — MICROALBUMIN / CREATININE URINE RATIO: Microalb Creat Ratio: 8

## 2023-09-30 LAB — MICROALBUMIN, URINE: Microalb, Ur: 0.4

## 2023-09-30 MED ORDER — DROPLET PEN NEEDLES 31G X 5 MM MISC: 1 refills | Status: AC

## 2023-09-30 MED ORDER — FREESTYLE LIBRE 3 PLUS SENSOR MISC: 3 refills | Status: DC

## 2023-09-30 MED ORDER — NOVOLOG FLEXPEN 100 UNIT/ML ~~LOC~~ SOPN
10.0000 [IU] | PEN_INJECTOR | Freq: Three times a day (TID) | SUBCUTANEOUS | 3 refills | Status: DC
Start: 2023-09-30 — End: 2024-01-07

## 2023-09-30 MED ORDER — DAPAGLIFLOZIN PROPANEDIOL 10 MG PO TABS
10.0000 mg | ORAL_TABLET | Freq: Every day | ORAL | 3 refills | Status: DC
Start: 2023-09-30 — End: 2024-01-07

## 2023-09-30 MED ORDER — TRESIBA FLEXTOUCH 100 UNIT/ML ~~LOC~~ SOPN: 15.0000 [IU] | PEN_INJECTOR | Freq: Every day | SUBCUTANEOUS | 3 refills | Status: DC

## 2023-09-30 NOTE — Progress Notes (Signed)
 Endocrinology Follow Up Note       09/30/2023, 3:42 PM   Subjective:    Patient ID: Denise Sharp, female    DOB: [birth year].  Denise Sharp is being seen in follow up after being seen in consultation for management of currently uncontrolled symptomatic diabetes requested by  Sherlene Shams, MD.   Past Medical History:  Diagnosis Date   (HFpEF) heart failure with preserved ejection fraction (HCC)    a. 12/2019 Echo: EF 65-70%, Gr2 DD. No significant valvular dzs.   Abnormality of gait 03/25/2013   Adrenal mass, left (HCC)    Anginal pain (HCC)    Anxiety    Aortic atherosclerosis (HCC)    Arthritis    Asthma    CAD (coronary artery disease) with h/o Atypical Chest Pain    a. 04/1986 Cath ([facility]): nl cors, EF 65%; b. 07/2012 Cath [facility]): Diff minor irregs; c. 07/2016 MV [facility]): "Equivocal"; d. 08/2016 Cardiac CT Ca2+ score [facility]): Ca2+ 1548; e. 05/2019 MV: No ischemia. EF 75%; f. 12/2019 PCI: LM nl, LAD 65ost (3.5x12 Resolute DES), 68m (2.75x12 Resolute DES),LCX/RCA nl; g. 08/2020 Cath: patent LAD stents, RCA 40ost, elev RH pressures, EF >65%.   Cervical spinal stenosis 1994   due to trauma to back ([employer] accident), has intermittent paralysis and parasthesias   Cervicogenic headache 03/23/2014   CKD (chronic kidney disease), stage III (HCC)    Depression    Diverticulosis    Dizziness    a.) chronic   DJD (degenerative joint disease)    a. Chronic R shoulder pain   Dyspnea    Esophageal stenosis 09/2009   a.) transient outlet obstruction by food, cleared by EGD   Family history of adverse reaction to anesthesia    a.) daughter with (+) PONV   Gastric bypass status for obesity    GERD (gastroesophageal reflux disease)    Headache(784.0)    HLD (hyperlipidemia)    Hypertension    IBS (irritable bowel syndrome)    IDA (iron deficiency anemia)    a.) post 2 unit txfsn 2009, normal endo/colonoscopy by  [facility]   Left bundle branch block (LBBB)    a.) Intermittently present - likely rate related.   NSVT (nonsustained ventricular tachycardia) (HCC) 11/15/2020   a.) Holter study 11/15/2020; 11 episodes of NSVT lasting up to 8 beats with a maximum rate of 210 bpm; 14 atrial runs lasting up to 18 beats with a rate of up to 250 bpm.  Some atrial runs felt to be NSVT.   NSVT (nonsustained ventricular tachycardia) (HCC)    a. 10/2020 [facility]: 11 runs of NSVT up to 8 beats.   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 10/2020 [facility]: 14 atrial runs up to 18 beats, max HR 250.   Restless leg syndrome    Rotator cuff arthropathy, right 08/13/2017   Syncope and collapse 03/12/2014   Type II diabetes mellitus (HCC)     Past Surgical History:  Procedure Laterality Date   APPENDECTOMY  BACK SURGERY     CARDIOVERSION N/A 10/30/2022   Procedure: CARDIOVERSION;  Surgeon: [name], MD;  Location: [facility] ORS;  Service: Cardiovascular;  Laterality: N/A;   CHOLECYSTECTOMY N/A 1976   COLONOSCOPY     CORONARY ANGIOPLASTY     CORONARY STENT INTERVENTION N/A 01/04/2020   Procedure: CORONARY STENT INTERVENTION;  Surgeon: Iran Ouch, MD;  Location: [facility];  Service: Cardiovascular;  Laterality: N/A;  LAD    DIAPHRAGMATIC HERNIA REPAIR  2015   ESOPHAGEAL DILATION     multiple   ESOPHAGOGASTRODUODENOSCOPY (EGD) WITH PROPOFOL N/A 05/11/2021   Procedure: ESOPHAGOGASTRODUODENOSCOPY (EGD) WITH PROPOFOL;  Surgeon: [name], MD;  Location: [facility] ENDOSCOPY;  Service: Endoscopy;  Laterality: N/A;   ESOPHAGOGASTRODUODENOSCOPY (EGD) WITH PROPOFOL N/A 06/13/2021   Procedure: ESOPHAGOGASTRODUODENOSCOPY (EGD) WITH PROPOFOL;  Surgeon: [name], MD;  Location: [facility];  Service: Endoscopy;  Laterality: N/A;   ESOPHAGOGASTRODUODENOSCOPY (EGD)  WITH PROPOFOL N/A 02/28/2023   Procedure: ESOPHAGOGASTRODUODENOSCOPY (EGD) WITH PROPOFOL;  Surgeon: [name], MD;  Location: [facility] ENDOSCOPY;  Service: Endoscopy;  Laterality: N/A;   EYE SURGERY     GASTRIC BYPASS  2000, 2005   Dr. Arlana Pouch IMPLANT PLACEMENT  10/2011   Jeannette How IMPLANT PLACEMENT N/A 09/18/2021   Procedure: Leane Platt IMPLANT FIRST STAGE;  Surgeon: Alfredo Martinez, MD;  Location: [facility];  Service: Urology;  Laterality: N/A;   INTERSTIM IMPLANT PLACEMENT N/A 09/18/2021   Procedure: Leane Platt IMPLANT SECOND STAGE WITH IMPEDENCE CHECK;  Surgeon: Alfredo Martinez, MD;  Location: [facility];  Service: Urology;  Laterality: N/A;   INTERSTIM IMPLANT REMOVAL N/A 09/18/2021   Procedure: REMOVAL OF INTERSTIM IMPLANT;  Surgeon: Alfredo Martinez, MD;  Location: [facility];  Service: Urology;  Laterality: N/A;   LEFT HEART CATH AND CORONARY ANGIOGRAPHY N/A 04/1986   LEFT HEART CATH AND CORONARY ANGIOGRAPHY Left 07/24/2012   LEFT HEART CATH AND CORS/GRAFTS ANGIOGRAPHY N/A 12/31/2019   Procedure: LEFT HEART CATH AND CORS/GRAFTS ANGIOGRAPHY;  Surgeon: Antonieta Iba, MD;  Location: [facility];  Service: Cardiovascular;  Laterality: N/A;   PANNICULECTOMY N/A 06/16/2019   Procedure: PANNICULECTOMY;  Surgeon: Allena Napoleon, MD;  Location: [facility];  Service: Plastics;  Laterality: N/A;  3 hours, please   REVERSE SHOULDER ARTHROPLASTY Right 08/13/2017   Procedure: REVERSE RIGHT SHOULDER ARTHROPLASTY;  Surgeon: Teryl Lucy, MD;  Location: [facility];  Service: Orthopedics;  Laterality: Right;   RIGHT/LEFT HEART CATH AND CORONARY ANGIOGRAPHY N/A 09/06/2020   Procedure: RIGHT/LEFT HEART CATH AND CORONARY ANGIOGRAPHY;  Surgeon: [name], MD;  Location: [facility];  Service: Cardiovascular;  Laterality: N/A;   RIGHT/LEFT HEART CATH AND CORONARY ANGIOGRAPHY N/A 06/04/2022   Procedure: RIGHT/LEFT HEART CATH AND CORONARY ANGIOGRAPHY;  Surgeon: [name], MD;  Location: [facility];  Service: Cardiovascular;  Laterality: N/A;   ROTATOR CUFF REPAIR Right    SPINE SURGERY  1995   [place]   TOOTH EXTRACTION  11/13/2021   TOTAL ABDOMINAL HYSTERECTOMY W/ BILATERAL SALPINGOOPHORECTOMY  1974   TOTAL KNEE ARTHROPLASTY Bilateral 2007   Procedure: TOTAL KNEE ARTHROPLASTY; Surgeons: Gerrit Heck, MD and [name], MD   UMBILICAL HERNIA REPAIR  08/11/2015    Social History   Socioeconomic History   Marital status: Married    Spouse name: Alinda Money    Number of children: 2   Years of education: College    Highest education level: Not on file  Occupational History    Employer: retired  Tobacco  Use   Smoking status: Never    Passive exposure: Never   Smokeless tobacco: Never  Vaping Use   Vaping status: Never Used  Substance and Sexual Activity   Alcohol use: No   Drug use: Not Currently    Comment: prescribed valium   Sexual activity: Not Currently    Birth control/protection: Post-menopausal, Surgical  Other Topics Concern   Not on file  Social History Narrative   Patient lives at home with husband Alinda Money.    Right Handed   Drinks caffeinated tea occasionally   One pet in house, dog   Social Drivers of Health   Financial Resource Strain: Low Risk  (09/15/2021)   Overall Financial Resource Strain (CARDIA)    Difficulty of Paying Living Expenses: Not hard at all  Food Insecurity: No Food Insecurity (04/05/2023)   Hunger Vital Sign    Worried About Running Out of Food in the Last Year: Never true    Ran Out of Food in the Last Year: Never true  Transportation Needs: No Transportation Needs (04/05/2023)   PRAPARE - Administrator, Civil Service (Medical): No    Lack of Transportation (Non-Medical): No  Physical Activity: Insufficiently Active (11/15/2020)   Exercise Vital Sign    Days of Exercise per Week: 2 days    Minutes of Exercise per Session: 40 min  Stress: Stress Concern Present (08/03/2020)   [employer]  of Occupational Health - Occupational Stress Questionnaire    Feeling of Stress : Rather much  Social Connections: Not on file    Family History  Problem Relation Age of Onset   Heart disease Father    Hypertension Father    Prostate cancer Father    Stroke Father    Osteoporosis Father    Stroke Mother    Depression Mother    Headache Mother    Heart disease Mother    Thyroid disease Mother    Hypertension Mother    Diabetes Daughter    Heart disease Daughter    Hypertension Daughter    Hypertension Son     Outpatient Encounter Medications as of 09/30/2023  Medication Sig   apixaban (ELIQUIS) 5 MG TABS tablet Take 1 tablet (5 mg total) by mouth 2 (two) times daily.   atorvastatin (LIPITOR) 80 MG tablet Take 1 tablet (80 mg total) by mouth daily.   budesonide-formoterol (SYMBICORT) 160-4.5 MCG/ACT inhaler Inhale 2 puffs into the lungs 2 (two) times daily.   butalbital-acetaminophen-caffeine (FIORICET) 50-325-40 MG tablet TAKE 1 TABLET BY MOUTH EVERY 6 HOURS AS NEEDED FOR HEADACHE.   cephALEXin (KEFLEX) 500 MG capsule Take 1 capsule (500 mg total) by mouth 3 (three) times daily.   Cholecalciferol (VITAMIN D3) 250 MCG (10000 UT) capsule Take 10,000 Units by mouth daily.   clotrimazole-betamethasone (LOTRISONE) cream Apply 1 application topically 2 (two) times daily as needed (irritation).   conjugated estrogens (PREMARIN) vaginal cream Apply one pea-sized amount around the opening of the urethra daily for 2 weeks, then 3 times weekly moving forward.   Continuous Blood Gluc Receiver (FREESTYLE LIBRE 2 READER) DEVI Use to check sugar at least TID   Continuous Glucose Sensor (FREESTYLE LIBRE 3 PLUS SENSOR) MISC Change sensor every 15 days.   cyanocobalamin (VITAMIN B12) 1000 MCG/ML injection Inject 1 mL (1,000 mcg total) into the muscle every 30 (thirty) days. Inject 1 ml (1000 mcg ) IM weekly x 4,  Then monthly thereafter   dicyclomine (BENTYL) 20 MG tablet Take 1 tablet (20  mg total)  by mouth 3 (three) times daily before meals.   DULoxetine (CYMBALTA) 60 MG capsule Take 1 capsule (60 mg total) by mouth daily.   ezetimibe (ZETIA) 10 MG tablet Take 1 tablet (10 mg total) by mouth daily.   fexofenadine (ALLEGRA) 180 MG tablet Take 180 mg by mouth as needed for allergies or rhinitis.   Finerenone (KERENDIA) 10 MG TABS Take 1 tablet (10 mg total) by mouth in the morning.   fluticasone (FLONASE) 50 MCG/ACT nasal spray Place 2 sprays into both nostrils daily.   Glucagon (GVOKE HYPOPEN 2-PACK) 0.5 MG/0.1ML SOAJ Inject 1 auto injector subcutaneously as needed for severe hypoglycemia. May repeat x 1 after 15 minutes if needed.   glucose blood (ONETOUCH VERIO) test strip Use to check blood sugar twice daily.   hydrocortisone (ANUSOL-HC) 25 MG suppository Place 1 suppository (25 mg total) rectally 2 (two) times daily.   lipase/protease/amylase (CREON) 36000 UNITS CPEP capsule Take 2 capsules (72,000 Units total) by mouth 3 (three) times daily before meals AND 1 capsule (36,000 Units total) with snacks.   loperamide (IMODIUM) 2 MG capsule Take 4-6 mg by mouth 3 (three) times daily before meals.   nitroGLYCERIN (NITROSTAT) 0.4 MG SL tablet Place 1 tablet (0.4 mg total) under the tongue every 5 (five) minutes as needed for chest pain.   nystatin (MYCOSTATIN/NYSTOP) powder Apply 1 g topically 4 (four) times daily as needed (rash).   ondansetron (ZOFRAN-ODT) 4 MG disintegrating tablet Take 1 tablet (4 mg total) by mouth every 8 (eight) hours as needed for nausea or vomiting.   OneTouch Delica Lancets 33G MISC Used to check blood sugar two times a day.   OVER THE COUNTER MEDICATION Apply 1 application  topically daily as needed (pain). [place] Gel topical pain reliever   pantoprazole (PROTONIX) 40 MG tablet Take 1 tablet (40 mg total) by mouth daily.   Polyethyl Glycol-Propyl Glycol (SYSTANE OP) Place 1 drop into both eyes 4 (four) times daily as needed (dry eyes).   potassium chloride 20 MEQ/15ML  (10%) SOLN Take 15 mLs (20 mEq total) by mouth daily.   pregabalin (LYRICA) 50 MG capsule Take 1 capsule (50 mg total) by mouth 2 (two) times daily.   tiZANidine (ZANAFLEX) 4 MG tablet Take 1 tablet (4 mg total) by mouth every 6 (six) hours as needed for muscle spasms.   torsemide (DEMADEX) 20 MG tablet Take 3 tablets (60 mg total) by mouth 2 (two) times daily.   [DISCONTINUED] dapagliflozin propanediol (FARXIGA) 10 MG TABS tablet Take 1 tablet (10 mg total) by mouth daily.   [DISCONTINUED] insulin aspart (NOVOLOG FLEXPEN) 100 UNIT/ML FlexPen Inject 10-16 Units into the skin 3 (three) times daily with meals.   [DISCONTINUED] insulin degludec (TRESIBA FLEXTOUCH) 100 UNIT/ML FlexTouch Pen Inject 15 Units into the skin daily.   [DISCONTINUED] Insulin Pen Needle (DROPLET PEN NEEDLES) 31G X 5 MM MISC USE ONE NEEDLE SUBCUTANEOUSLY AS DIRECTED (REMOVE AND DISCARD NEEDLE IN SHARPS CONTAINER IMMEDIATELY AFTER USE)   dapagliflozin propanediol (FARXIGA) 10 MG TABS tablet Take 1 tablet (10 mg total) by mouth daily.   insulin aspart (NOVOLOG FLEXPEN) 100 UNIT/ML FlexPen Inject 10-16 Units into the skin 3 (three) times daily with meals.   insulin degludec (TRESIBA FLEXTOUCH) 100 UNIT/ML FlexTouch Pen Inject 15 Units into the skin daily.   Insulin Pen Needle (DROPLET PEN NEEDLES) 31G X 5 MM MISC USE ONE NEEDLE SUBCUTANEOUSLY AS DIRECTED (REMOVE AND DISCARD NEEDLE IN SHARPS CONTAINER IMMEDIATELY AFTER USE)  No facility-administered encounter medications on file as of 09/30/2023.    ALLERGIES: Allergies  Allergen Reactions   Demeclocycline Hives   Erythromycin Nausea And Vomiting and Other (See Comments)    Severe irritable bowel   Flagyl [Metronidazole] Nausea And Vomiting and Other (See Comments)    Severe irritable bowel   Glucophage [Metformin Hcl] Nausea And Vomiting and Other (See Comments)    "Sick" "I won't take anything that has metformin in it"   Tetracyclines & Related Hives and Rash   Potassium  Chloride Other (See Comments)    Cannot swallow potssium pills, able to tolerate liquid potassium   Tetracycline Other (See Comments)   Diovan [Valsartan] Nausea Only        Sulfa Antibiotics Other (See Comments) and Rash    As child   Xanax [Alprazolam] Other (See Comments)    Hyperactivity     VACCINATION STATUS: Immunization History  Administered Date(s) Administered   Fluad Quad(high Dose 65+) 03/06/2019, 06/07/2020, 07/27/2021, 07/13/2022   Fluad Trivalent(High Dose 65+) 03/29/2023   Influenza Split 06/13/2011   Influenza, High Dose Seasonal PF 08/21/2018   Influenza,inj,Quad PF,6+ Mos 05/04/2014, 05/12/2015   Influenza-Unspecified 04/15/2012   Moderna Sars-Covid-2 Vaccination 02/02/2020, 03/01/2020, 09/01/2020   Pneumococcal Conjugate-13 08/11/2013, 01/06/2014   Pneumococcal Polysaccharide-23 01/19/2010, 09/03/2018   Tdap 04/18/2016, 11/20/2020    Diabetes She presents for her follow-up diabetic visit. She has type 2 diabetes mellitus. Onset time: Diagnosed at approx age of 71. Her disease course has been fluctuating. There are no hypoglycemic associated symptoms. (syncope) Associated symptoms include fatigue, foot paresthesias, polydipsia and polyuria (on diuretics). Pertinent negatives for diabetes include no weight loss. There are no hypoglycemic complications. (Been known to have syncopal episodes in the past) Symptoms are stable. Diabetic complications include a CVA (incidental finding), heart disease, nephropathy and peripheral neuropathy. Risk factors for coronary artery disease include obesity, hypertension, diabetes mellitus, dyslipidemia, family history, sedentary lifestyle and post-menopausal. Current diabetic treatment includes intensive insulin program and oral agent (monotherapy). She is compliant with treatment most of the time. Her weight is increasing rapidly. She is following a generally unhealthy (she admits to eating on the go a lot due to various appointments)  diet. When asked about meal planning, she reported none. She has not had a previous visit with a dietitian. She rarely participates in exercise. Her home blood glucose trend is fluctuating minimally. Her overall blood glucose range is >200 mg/dl. (She presents today, accompanied by her husband, with her CGM showing fluctuating glycemic profile with above target readings overall.  Her POCT A1c today is 9.3%, improving slightly from last visit of 9.5%.  Analysis of her CGM shows TIR 41%, TAR 59%, TBR 0% with a GMI of 8.5%.  She has been sick since last visit but seems to be getting her glucose back on track now.) An ACE inhibitor/angiotensin II receptor blocker is not being taken. She sees a podiatrist.Eye exam is current.     Review of systems  Constitutional: + rapidly increasing body weight, current Body mass index is 41.88 kg/m., + fatigue, no subjective hyperthermia, no subjective hypothermia Eyes: no blurry vision, no xerophthalmia ENT: no sore throat, no nodules palpated in throat, no dysphagia/odynophagia, no hoarseness Cardiovascular: no chest pain, no shortness of breath, no palpitations, + intermittent leg swelling Respiratory: no cough, + shortness of breath Gastrointestinal: no nausea/vomiting, on Creon for EPI Musculoskeletal: no muscle/joint aches, walks with walker Skin: no rashes, no hyperemia Neurological: no tremors, no numbness, no tingling, no dizziness  Psychiatric: no depression, no anxiety  Objective:     BP 102/80 (BP Location: Left Arm, Patient Position: Sitting, Cuff Size: Large)   Pulse 76   Ht 5\' 4"  (1.626 m)   Wt 244 lb (110.7 kg)   BMI 41.88 kg/m   Wt Readings from Last 3 Encounters:  09/30/23 244 lb (110.7 kg)  07/26/23 245 lb 11.2 oz (111.4 kg)  07/25/23 245 lb (111.1 kg)     BP Readings from Last 3 Encounters:  09/30/23 102/80  08/29/23 122/64  07/26/23 (!) 113/55     Physical Exam- Limited  Constitutional:  Body mass index is 41.88 kg/m. , not  in acute distress, normal state of mind Eyes:  EOMI, no exophthalmos Musculoskeletal: no gross deformities, strength intact in all four extremities, no gross restriction of joint movements, walks with walker Skin:  no rashes, no hyperemia Neurological: no tremor with outstretched hands  Diabetic Foot Exam - Simple   No data filed     CMP ( most recent) CMP     Component Value Date/Time   NA 136 07/26/2023 0950   NA 142 03/05/2023 1326   NA 141 06/21/2014 1349   K 3.7 07/26/2023 0950   K 4.0 06/21/2014 1349   CL 98 07/26/2023 0950   CL 106 06/21/2014 1349   CO2 26 07/26/2023 0950   CO2 24 06/21/2014 1349   GLUCOSE 325 (H) 07/26/2023 0950   GLUCOSE 174 (H) 06/21/2014 1349   BUN 31 (H) 07/26/2023 0950   BUN 13 03/05/2023 1326   BUN 12 06/21/2014 1349   CREATININE 1.53 (H) 07/26/2023 0950   CREATININE 1.14 (H) 07/13/2022 1529   CALCIUM 8.4 (L) 07/26/2023 0950   CALCIUM 8.7 06/21/2014 1349   PROT 6.9 07/26/2023 0950   PROT 7.3 10/30/2012 1436   ALBUMIN 3.1 (L) 07/26/2023 0950   ALBUMIN 3.0 (L) 10/30/2012 1436   AST 22 07/26/2023 0950   ALT 18 07/26/2023 0950   ALT 19 10/30/2012 1436   ALKPHOS 115 07/26/2023 0950   ALKPHOS 125 10/30/2012 1436   BILITOT 0.6 07/26/2023 0950   GFRNONAA 35 (L) 07/26/2023 0950   GFRNONAA 57 (L) 06/21/2014 1349   GFRNONAA 55 (L) 02/25/2014 2153   GFRNONAA 70 12/13/2011 0919   GFRAA >60 03/23/2020 1204   GFRAA >60 06/21/2014 1349   GFRAA >60 02/25/2014 2153   GFRAA 80 12/13/2011 0919     Diabetic Labs (most recent): Lab Results  Component Value Date   HGBA1C 9.3 (A) 09/30/2023   HGBA1C 9.5 (A) 06/24/2023   HGBA1C 7.6 (A) 03/21/2023   MICROALBUR <0.7 11/21/2021   MICROALBUR 2.8 (H) 06/04/2017   MICROALBUR 9.5 (H) 06/29/2016     Lipid Panel ( most recent) Lipid Panel     Component Value Date/Time   CHOL 103 10/23/2022 1324   CHOL 155 02/27/2014 0420   TRIG 98 10/23/2022 1324   TRIG 121 02/27/2014 0420   HDL 38 (L) 10/23/2022  1324   HDL 43 02/27/2014 0420   CHOLHDL 2.7 10/23/2022 1324   VLDL 20 10/23/2022 1324   VLDL 24 02/27/2014 0420   LDLCALC 45 10/23/2022 1324   LDLCALC 114 (H) 07/13/2022 1529   LDLCALC 88 02/27/2014 0420   LDLDIRECT 126 (H) 07/13/2022 1529      Lab Results  Component Value Date   TSH 2.887 09/27/2022   TSH 2.209 02/08/2021   TSH 3.695 07/17/2020   TSH 3.50 01/01/2019   TSH 2.279 08/20/2017   TSH 4.400 06/16/2015  TSH 1.16 05/03/2015   TSH 0.408 (L) 10/29/2014   TSH 5.099 (H) 08/13/2014   TSH 0.93 12/03/2013   FREET4 1.10 05/03/2015           Assessment & Plan:   1) Type 2 diabetes mellitus with hyperglycemia, with long-term current use of insulin (HCC)  She presents today, accompanied by her husband, with her CGM showing fluctuating glycemic profile with above target readings overall.  Her POCT A1c today is 9.3%, improving slightly from last visit of 9.5%.  Analysis of her CGM shows TIR 41%, TAR 59%, TBR 0% with a GMI of 8.5%.  She has been sick since last visit but seems to be getting her glucose back on track now.  She still admits to not eating the healthiest all the time.  - SHARIAN DELIA has currently uncontrolled symptomatic type 2 DM since 78 years of age.   -Recent labs reviewed.  - I had a long discussion with her about the progressive nature of diabetes and the pathology behind its complications. -her diabetes is complicated by CAD, CHF, Neuropathy, CKD stage 3, CVA and she remains at a high risk for more acute and chronic complications which include CAD, CVA, CKD, retinopathy, and neuropathy. These are all discussed in detail with her.  The following Lifestyle Medicine recommendations according to American College of Lifestyle Medicine [facility]) were discussed and offered to patient and she agrees to start the journey:  A. Whole Foods, Plant-based plate comprising of fruits and vegetables, plant-based proteins, whole-grain carbohydrates was discussed in  detail with the patient.   A list for source of those nutrients were also provided to the patient.  Patient will use only water or unsweetened tea for hydration. B.  The need to stay away from risky substances including alcohol, smoking; obtaining 7 to 9 hours of restorative sleep, at least 150 minutes of moderate intensity exercise weekly, the importance of healthy social connections,  and stress reduction techniques were discussed. C.  A full color page of  Calorie density of various food groups per pound showing examples of each food groups was provided to the patient.  - Nutritional counseling repeated at each appointment due to patients tendency to fall back in to old habits.  - The patient admits there is a room for improvement in their diet and drink choices. -  Suggestion is made for the patient to avoid simple carbohydrates from their diet including Cakes, Sweet Desserts / Pastries, Ice Cream, Soda (diet and regular), Sweet Tea, Candies, Chips, Cookies, Sweet Pastries, Store Bought Juices, Alcohol in Excess of 1-2 drinks a day, Artificial Sweeteners, Coffee Creamer, and "Sugar-free" Products. This will help patient to have stable blood glucose profile and potentially avoid unintended weight gain.   - I encouraged the patient to switch to unprocessed or minimally processed complex starch and increased protein intake (animal or plant source), fruits, and vegetables.   - Patient is advised to stick to a routine mealtimes to eat 3 meals a day and avoid unnecessary snacks (to snack only to correct hypoglycemia).  - I have approached her with the following individualized plan to manage her diabetes and patient agrees:   -She is advised to continue her Tresiba 15 units SQ nightly and Novolog 10-16 units TID with meals if glucose is above 90 and she is eating (Specific instructions on how to titrate insulin dosage based on glucose readings given to patient in writing).  She can also continue her  Farxiga 10 mg po  daily.   -she is encouraged to continue monitoring glucose 4 times daily (using her CGM), before meals and before bed, and to call the clinic if she has readings less than 70 or above 300 for 3 tests in a row.  - she is warned not to take insulin without proper monitoring per orders. - Adjustment parameters are given to her for hypo and hyperglycemia in writing.  - she is not a candidate for Metformin due to concurrent renal insufficiency, nor did she tolerate it in the past.  She also did not tolerate GLP1 products due to nausea previously.  - Specific targets for  A1c; LDL, HDL, and Triglycerides were discussed with the patient.  2) Blood Pressure /Hypertension:  her blood pressure is controlled to target-slightly low today in office.   she is advised to continue her current medications per cardiology/PCP.  3) Lipids/Hyperlipidemia:    Review of her recent lipid panel from 10/23/22 showed controlled LDL at 45 .  she is advised to continue Lipitor 80 mg daily at bedtime and Zetia 10 mg po daily.  Side effects and precautions discussed with her.  4)  Weight/Diet:  her Body mass index is 41.88 kg/m.  -  clearly complicating her diabetes care.   she is a candidate for weight loss. I discussed with her the fact that loss of 5 - 10% of her  current body weight will have the most impact on her diabetes management.  Exercise, and detailed carbohydrates information provided  -  detailed on discharge instructions.  She has had gastric bypass surgery x 2.  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  Recent imaging of abdomen (done for different reason) shows stable mass, still consistent with benignity.  She will not need additional follow up for this.  6) Chronic Care/Health Maintenance: -she is not on ACEI/ARB and is on Statin medications and is encouraged to initiate and continue to follow up with Ophthalmology, Dentist, Podiatrist at least yearly or  according to recommendations, and advised to stay away from smoking. I have recommended yearly flu vaccine and pneumonia vaccine at least every 5 years; moderate intensity exercise for up to 150 minutes weekly; and sleep for at least 7 hours a day.  - she is advised to maintain close follow up with Sherlene Shams, MD for primary care needs, as well as her other providers for optimal and coordinated care.  I do wonder if there could be a component of EPI affecting her digestion of foods.  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     I spent  46  minutes in the care of the patient today including review of labs from CMP, Lipids, Thyroid Function, Hematology (current and previous including abstractions from other facilities); face-to-face time discussing  her blood glucose readings/logs, discussing hypoglycemia and hyperglycemia episodes and symptoms, medications doses, her options of short and long term treatment based on the latest standards of care / guidelines;  discussion about incorporating lifestyle medicine;  and documenting the encounter. Risk reduction counseling performed per USPSTF guidelines to reduce obesity and cardiovascular risk factors.     Please refer to Patient Instructions for Blood Glucose Monitoring and Insulin/Medications Dosing Guide"  in media tab for additional information. Please  also refer to " Patient Self Inventory" in the Media  tab for reviewed elements of pertinent patient history.  Denise Sharp participated in the discussions, expressed understanding,  and voiced agreement with the above plans.  All questions were answered to her satisfaction. she is encouraged to contact clinic should she have any questions or concerns prior to her return visit.     Follow up plan: - Return in about 3 months (around 12/31/2023) for Diabetes F/U with A1c in office, No previsit labs, Bring meter and logs.   Ronny Bacon, [facility]  [facility] [address] [place], [place] [address] Phone: [phone] Fax: [phone]  09/30/2023, 3:42 PM

## 2023-10-03 ENCOUNTER — Ambulatory Visit: Payer: Self-pay

## 2023-10-03 DIAGNOSIS — E1122 Type 2 diabetes mellitus with diabetic chronic kidney disease: Secondary | ICD-10-CM | POA: Diagnosis not present

## 2023-10-03 DIAGNOSIS — R609 Edema, unspecified: Secondary | ICD-10-CM | POA: Diagnosis not present

## 2023-10-03 DIAGNOSIS — D649 Anemia, unspecified: Secondary | ICD-10-CM | POA: Diagnosis not present

## 2023-10-03 DIAGNOSIS — R809 Proteinuria, unspecified: Secondary | ICD-10-CM | POA: Diagnosis not present

## 2023-10-03 DIAGNOSIS — E1129 Type 2 diabetes mellitus with other diabetic kidney complication: Secondary | ICD-10-CM | POA: Diagnosis not present

## 2023-10-03 DIAGNOSIS — E785 Hyperlipidemia, unspecified: Secondary | ICD-10-CM | POA: Diagnosis not present

## 2023-10-03 DIAGNOSIS — D508 Other iron deficiency anemias: Secondary | ICD-10-CM | POA: Diagnosis not present

## 2023-10-03 DIAGNOSIS — N3281 Overactive bladder: Secondary | ICD-10-CM | POA: Diagnosis not present

## 2023-10-03 DIAGNOSIS — I509 Heart failure, unspecified: Secondary | ICD-10-CM | POA: Diagnosis not present

## 2023-10-03 NOTE — Telephone Encounter (Signed)
 Chief Complaint: bilateral hand/fingers/wrist swelling Symptoms: moderate to severe swelling bilateral fingers/hands/wrists, moderate pain in right wrist, skin sloughing right index finger. Frequency: x 1 month. Pertinent Negatives: Patient denies fever, redness, worsening SOB Disposition: [x] ED /[] Urgent Care (no appt availability in office) / [] Appointment(In office/virtual)/ []  [facility]/ [] Home Care/ [x] Refused Recommended Disposition /[] [facility]/ []  Follow-up with PCP Additional Notes: Patient saw her nephrologist today, she was told her Creatinine was elevated. She showed her nephrologist her painful swelling in both hands and she was told it is likely gout. She states the doctor said he does not treat gout and recommended she call her PCP and ask for some medication to be sent in and also for a  referral to Rheumatology. She would like a referral to see a Rheumatologist in [place]. Patient states she can not be seen today due to she already has an appt later today. She states she is not available until next week. She states the swelling is so severe she can not make a fist and is unable to use her hands. Advised she seek immediate ED care, patient refused. Called [facility], staff member Charlynne Pander notified of ED refusal.  Copied from CRM [id number]. Topic: Clinical - Red Word Triage >> Oct 03, 2023 12:54 PM Denese Killings wrote: Red Word that prompted transfer to Nurse Triage: Patient has severe pain in her hands, wrist, and fingers. She can't pick up things nor make a fist. Patient thinks she has gout.   *also wants a referral to Rheumatology (referral request stated to schedule an appointment since she hasn't spoken with Dr. Darrick Huntsman about it before) Reason for Disposition  [1] Can't use hand or can barely use hand AND [2] new-onset  Answer Assessment - Initial Assessment Questions 1. ONSET: "When did the swelling start?" (e.g., minutes, hours, days)     X 1 month.  2.  LOCATION: "What part of the hand is swollen?"  "Are both hands swollen or just one hand?"     Both hands, entire hands/fingers and wrists. Tops of hands look puffy.  3. SEVERITY: "How bad is the swelling?" (e.g., localized; mild, moderate, severe)   - BALL OR LUMP: small ball or lump   - LOCALIZED: puffy or swollen area or patch of skin   - JOINT SWELLING: swelling of a joint   - MILD: puffiness or mild swelling of fingers or hand   - MODERATE: fingers and hand are swollen   - SEVERE: swelling of entire hand and up into forearm    Patient states she has difficulty making a fist or even holding a fork. Swelling involves entire hand and wrist. Denies it going into forearm. Moderate.  4. REDNESS: "Does the swelling look red or infected?"     She states the fingers have been red over the past month but not present today.  5. PAIN: "Is the swelling painful to touch?" If Yes, ask: "How painful is it?"   (Scale 1-10; mild, moderate or severe)     Right wrist painful, 5/10.  6. FEVER: "Do you have a fever?" If Yes, ask: "What is it, how was it measured, and when did it start?"      Denies.  7. CAUSE: "What do you think is causing the hand swelling?" (e.g., heat, insect bite, pregnancy, recent injury)     She saw her nephrologist today and states he told her she is having a gout flare up in her hands but was not willing to treat  her. He recommended she see her PCP.  8. MEDICAL HISTORY: "Do you have a history of heart failure, kidney disease, liver failure, or cancer?"     CHF, kidney disease.  9. RECURRENT SYMPTOM: "Have you had hand swelling before?" If Yes, ask: "When was the last time?" "What happened that time?"     She states she swells all the time which is why she is on the furosemide.   10. OTHER SYMPTOMS: "Do you have any other symptoms?" (e.g., blurred vision, difficulty breathing, headache)       SOB (baseline and not worse than baseline).  11. PREGNANCY: "Is there any chance you  are pregnant?" "When was your last menstrual period?"       N/A.  Protocols used: Hand Swelling-A-AH

## 2023-10-03 NOTE — Telephone Encounter (Signed)
 LMTCB

## 2023-10-04 NOTE — Telephone Encounter (Signed)
 Pt called back and scheduled an appointment for next week.

## 2023-10-09 ENCOUNTER — Ambulatory Visit (INDEPENDENT_AMBULATORY_CARE_PROVIDER_SITE_OTHER): Admitting: Internal Medicine

## 2023-10-09 ENCOUNTER — Encounter: Payer: Self-pay | Admitting: Internal Medicine

## 2023-10-09 VITALS — BP 96/50 | HR 77 | Ht 64.0 in | Wt 247.0 lb

## 2023-10-09 DIAGNOSIS — E1142 Type 2 diabetes mellitus with diabetic polyneuropathy: Secondary | ICD-10-CM

## 2023-10-09 DIAGNOSIS — Z9884 Bariatric surgery status: Secondary | ICD-10-CM | POA: Diagnosis not present

## 2023-10-09 DIAGNOSIS — E1149 Type 2 diabetes mellitus with other diabetic neurological complication: Secondary | ICD-10-CM | POA: Diagnosis not present

## 2023-10-09 DIAGNOSIS — R635 Abnormal weight gain: Secondary | ICD-10-CM | POA: Diagnosis not present

## 2023-10-09 DIAGNOSIS — Z794 Long term (current) use of insulin: Secondary | ICD-10-CM

## 2023-10-09 DIAGNOSIS — M109 Gout, unspecified: Secondary | ICD-10-CM | POA: Diagnosis not present

## 2023-10-09 MED ORDER — CYANOCOBALAMIN 1000 MCG/ML IJ SOLN: 1000.0000 ug | INTRAMUSCULAR | 1 refills | Status: DC

## 2023-10-09 MED ORDER — PREDNISONE 10 MG PO TABS
ORAL_TABLET | ORAL | 0 refills | Status: DC
Start: 2023-10-09 — End: 2023-10-23

## 2023-10-09 MED ORDER — ATORVASTATIN CALCIUM 80 MG PO TABS: 80.0000 mg | ORAL_TABLET | Freq: Every day | ORAL | 1 refills | Status: DC

## 2023-10-09 NOTE — Assessment & Plan Note (Signed)
 Uncontrolled due to dietary noncompliance ,  now managed by [facility] with CBG montiro

## 2023-10-09 NOTE — Progress Notes (Unsigned)
 Subjective:  Patient ID: Denise Sharp, female    DOB: [birth year]  Age: 78 y.o. MRN: [id number]  CC: There were no encounter diagnoses.   HPI Denise Sharp presents for follow up on multiple chronic issues .  She reports that she has been in significant pain since January ,  including red swollen index finger on right hand .  Went to a walk in clinic in early February when pai nwas severe.  X ray was done to rule out fracture of finger.  Right wrist has been painful as well.  Wants a referral to rheumatology but apparently can't return to [facility] for undisclosed reasons    1) OSA/OHS  2) CAD  3) T2DM referred to [facility] endocrinology for continued poor control resistant to change.seen last week   Taking farxiga per nephrology.    10 units of novolog tid qac,  with a sliding scale for elevated readings > 200 ,   15 units of tresiba at bedtime.  Using a CBG monitor  which frequently alarms   4) painful swollen right  index  finger  since February  elevated uric acid level of 11 per nephrology  GFR 35 most recently .  She took husband's indomethacin   Outpatient Medications Prior to Visit  Medication Sig Dispense Refill  . apixaban (ELIQUIS) 5 MG TABS tablet Take 1 tablet (5 mg total) by mouth 2 (two) times daily. 180 tablet 1  . atorvastatin (LIPITOR) 80 MG tablet Take 1 tablet (80 mg total) by mouth daily. 90 tablet 1  . budesonide-formoterol (SYMBICORT) 160-4.5 MCG/ACT inhaler Inhale 2 puffs into the lungs 2 (two) times daily. 1 each 6  . butalbital-acetaminophen-caffeine (FIORICET) 50-325-40 MG tablet TAKE 1 TABLET BY MOUTH EVERY 6 HOURS AS NEEDED FOR HEADACHE. 60 tablet 2  . cephALEXin (KEFLEX) 500 MG capsule Take 1 capsule (500 mg total) by mouth 3 (three) times daily. 21 capsule 0  . Cholecalciferol (VITAMIN D3) 250 MCG (10000 UT) capsule Take 10,000 Units by mouth daily.    . clotrimazole-betamethasone (LOTRISONE) cream Apply 1 application topically 2 (two) times  daily as needed (irritation).    . conjugated estrogens (PREMARIN) vaginal cream Apply one pea-sized amount around the opening of the urethra daily for 2 weeks, then 3 times weekly moving forward. 30 g 4  . Continuous Blood Gluc Receiver (FREESTYLE LIBRE 2 READER) DEVI Use to check sugar at least TID 1 each 1  . Continuous Glucose Sensor (FREESTYLE LIBRE 3 PLUS SENSOR) MISC Change sensor every 15 days. 6 each 3  . cyanocobalamin (VITAMIN B12) 1000 MCG/ML injection Inject 1 mL (1,000 mcg total) into the muscle every 30 (thirty) days. Inject 1 ml (1000 mcg ) IM weekly x 4,  Then monthly thereafter 3 mL 1  . dapagliflozin propanediol (FARXIGA) 10 MG TABS tablet Take 1 tablet (10 mg total) by mouth daily. 90 tablet 3  . dicyclomine (BENTYL) 20 MG tablet Take 1 tablet (20 mg total) by mouth 3 (three) times daily before meals. 180 tablet 2  . DULoxetine (CYMBALTA) 60 MG capsule Take 1 capsule (60 mg total) by mouth daily. 90 capsule 3  . ezetimibe (ZETIA) 10 MG tablet Take 1 tablet (10 mg total) by mouth daily. 90 tablet 3  . fexofenadine (ALLEGRA) 180 MG tablet Take 180 mg by mouth as needed for allergies or rhinitis.    . Finerenone (KERENDIA) 10 MG TABS Take 1 tablet (10 mg total) by mouth in the morning. 90 tablet  3  . fluticasone (FLONASE) 50 MCG/ACT nasal spray Place 2 sprays into both nostrils daily. 16 g 6  . Glucagon (GVOKE HYPOPEN 2-PACK) 0.5 MG/0.1ML SOAJ Inject 1 auto injector subcutaneously as needed for severe hypoglycemia. May repeat x 1 after 15 minutes if needed. 0.1 mL 2  . glucose blood (ONETOUCH VERIO) test strip Use to check blood sugar twice daily. 100 each 1  . hydrocortisone (ANUSOL-HC) 25 MG suppository Place 1 suppository (25 mg total) rectally 2 (two) times daily. 24 suppository 5  . insulin aspart (NOVOLOG FLEXPEN) 100 UNIT/ML FlexPen Inject 10-16 Units into the skin 3 (three) times daily with meals. 36 mL 3  . insulin degludec (TRESIBA FLEXTOUCH) 100 UNIT/ML FlexTouch Pen  Inject 15 Units into the skin daily. 15 mL 3  . Insulin Pen Needle (DROPLET PEN NEEDLES) 31G X 5 MM MISC USE ONE NEEDLE SUBCUTANEOUSLY AS DIRECTED (REMOVE AND DISCARD NEEDLE IN SHARPS CONTAINER IMMEDIATELY AFTER USE) 100 each 1  . lipase/protease/amylase (CREON) 36000 UNITS CPEP capsule Take 2 capsules (72,000 Units total) by mouth 3 (three) times daily before meals AND 1 capsule (36,000 Units total) with snacks. 300 capsule 9  . loperamide (IMODIUM) 2 MG capsule Take 4-6 mg by mouth 3 (three) times daily before meals.    . nitroGLYCERIN (NITROSTAT) 0.4 MG SL tablet Place 1 tablet (0.4 mg total) under the tongue every 5 (five) minutes as needed for chest pain. 25 tablet 5  . nystatin (MYCOSTATIN/NYSTOP) powder Apply 1 g topically 4 (four) times daily as needed (rash).    . ondansetron (ZOFRAN-ODT) 4 MG disintegrating tablet Take 1 tablet (4 mg total) by mouth every 8 (eight) hours as needed for nausea or vomiting. 30 tablet 0  . OneTouch Delica Lancets 33G MISC Used to check blood sugar two times a day. 100 each 4  . OVER THE COUNTER MEDICATION Apply 1 application  topically daily as needed (pain). [place] Gel topical pain reliever    . pantoprazole (PROTONIX) 40 MG tablet Take 1 tablet (40 mg total) by mouth daily. 90 tablet 3  . Polyethyl Glycol-Propyl Glycol (SYSTANE OP) Place 1 drop into both eyes 4 (four) times daily as needed (dry eyes).    . potassium chloride 20 MEQ/15ML (10%) SOLN Take 15 mLs (20 mEq total) by mouth daily. 473 mL 3  . pregabalin (LYRICA) 50 MG capsule Take 1 capsule (50 mg total) by mouth 2 (two) times daily. 180 capsule 1  . tiZANidine (ZANAFLEX) 4 MG tablet Take 1 tablet (4 mg total) by mouth every 6 (six) hours as needed for muscle spasms. 30 tablet 0  . torsemide (DEMADEX) 20 MG tablet Take 3 tablets (60 mg total) by mouth 2 (two) times daily. 180 tablet 2   No facility-administered medications prior to visit.    Review of Systems;  Patient denies headache, fevers,  malaise, unintentional weight loss, skin rash, eye pain, sinus congestion and sinus pain, sore throat, dysphagia,  hemoptysis , cough, dyspnea, wheezing, chest pain, palpitations, orthopnea, edema, abdominal pain, nausea, melena, diarrhea, constipation, flank pain, dysuria, hematuria, urinary  Frequency, nocturia, numbness, tingling, seizures,  Focal weakness, Loss of consciousness,  Tremor, insomnia, depression, anxiety, and suicidal ideation.      Objective:  There were no vitals taken for this visit.  BP Readings from Last 3 Encounters:  09/30/23 102/80  08/29/23 122/64  07/26/23 (!) 113/55    Wt Readings from Last 3 Encounters:  09/30/23 244 lb (110.7 kg)  07/26/23 245 lb 11.2 oz (  111.4 kg)  07/25/23 245 lb (111.1 kg)    Physical Exam  Lab Results  Component Value Date   HGBA1C 9.3 (A) 09/30/2023   HGBA1C 9.5 (A) 06/24/2023   HGBA1C 7.6 (A) 03/21/2023    Lab Results  Component Value Date   CREATININE 1.53 (H) 07/26/2023   CREATININE 1.72 (H) 07/01/2023   CREATININE 1.57 (H) 05/23/2023    Lab Results  Component Value Date   WBC 10.5 07/26/2023   HGB 12.8 07/26/2023   HCT 39.3 07/26/2023   PLT 249 07/26/2023   GLUCOSE 325 (H) 07/26/2023   CHOL 103 10/23/2022   TRIG 98 10/23/2022   HDL 38 (L) 10/23/2022   LDLDIRECT 126 (H) 07/13/2022   LDLCALC 45 10/23/2022   ALT 18 07/26/2023   AST 22 07/26/2023   NA 136 07/26/2023   K 3.7 07/26/2023   CL 98 07/26/2023   CREATININE 1.53 (H) 07/26/2023   BUN 31 (H) 07/26/2023   CO2 26 07/26/2023   TSH 2.887 09/27/2022   INR 1.3 (H) 12/14/2022   HGBA1C 9.3 (A) 09/30/2023   MICROALBUR <0.7 11/21/2021    DG Finger Index Left Result Date: 08/29/2023 CLINICAL DATA:  Redness, pain and swelling of the right index finger. EXAM: LEFT INDEX FINGER 2+V COMPARISON:  None Available. FINDINGS: There is no acute fracture or dislocation. The bones are osteopenic. There is degenerative changes of the interphalangeal joint with spurring.  There is diffuse soft tissue swelling of the index finger. No radiopaque foreign object or soft tissue gas. IMPRESSION: 1. No acute fracture or dislocation. 2. Diffuse soft tissue swelling of the index finger. Electronically Signed   By: Elgie Collard M.D.   On: 08/29/2023 17:20    Assessment & Plan:  .There are no diagnoses linked to this encounter.   I spent 34 minutes on the day of this face to face encounter reviewing patient's  most recent visit with cardiology,  nephrology,  and neurology,  prior relevant surgical and non surgical procedures, recent  labs and imaging studies, counseling on weight management,  reviewing the assessment and plan with patient, and post visit ordering and reviewing of  diagnostics and therapeutics with patient  .   Follow-up: No follow-ups on file.   Sherlene Shams, MD

## 2023-10-09 NOTE — Patient Instructions (Addendum)
 Indomethacin is not safe to use for gout FOR YOU  BECAUSE IT CAN HARM YOUR KIDNEYS  I have prescribed prednisone taper to use STARTING TONIGHT to get your inflammation down  SAFE TO ADD TYLENOL UP TO 1000 MG EVERY 8 HOURS FOR PAIN   Referral to Rheumatology is in process  . They will attempt to contact you 3 times and if you do not return their call they will quit trying SO PLEASE CHECK YOUR MESSAGES

## 2023-10-10 DIAGNOSIS — M109 Gout, unspecified: Secondary | ICD-10-CM | POA: Insufficient documentation

## 2023-10-10 LAB — LIPID PANEL W/REFLEX DIRECT LDL
Cholesterol: 90 mg/dL (ref <200)
HDL: 43 mg/dL — ABNORMAL LOW (ref >=50)
LDL Cholesterol (Calc): 28 mg/dL
Non-HDL Cholesterol (Calc): 47 mg/dL (ref <130)
Total CHOL/HDL Ratio: 2.1 (calc) (ref <5.0)
Triglycerides: 100 mg/dL (ref <150)

## 2023-10-10 NOTE — Assessment & Plan Note (Signed)
 She remains obese due t poor diet and inactivity.  I have addressed  BMI and recommended wt loss of 10% of body weight over the next 6 months using a low fat, fruit/vegetable based [place] diet and regular exercise a minimum of 5 days per week.

## 2023-10-10 NOTE — Assessment & Plan Note (Signed)
 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  Prednisone taper prescribed

## 2023-10-10 NOTE — Assessment & Plan Note (Signed)
 Managed by endocrinology with sliding scale insulin and Tresiba .Finerenone started for DM/CKD

## 2023-10-11 ENCOUNTER — Encounter: Payer: Self-pay | Admitting: Internal Medicine

## 2023-10-21 ENCOUNTER — Telehealth: Payer: Self-pay | Admitting: Internal Medicine

## 2023-10-21 ENCOUNTER — Telehealth: Payer: Self-pay | Admitting: Medical

## 2023-10-21 ENCOUNTER — Other Ambulatory Visit: Payer: Self-pay

## 2023-10-21 ENCOUNTER — Other Ambulatory Visit: Payer: Self-pay | Admitting: Internal Medicine

## 2023-10-21 ENCOUNTER — Encounter: Payer: Self-pay | Admitting: Medical

## 2023-10-21 ENCOUNTER — Ambulatory Visit: Payer: Medicare Other | Attending: Medical | Admitting: Medical

## 2023-10-21 VITALS — BP 124/72 | HR 84 | Resp 22 | Ht 64.5 in | Wt 242.0 lb

## 2023-10-21 DIAGNOSIS — I25758 Atherosclerosis of native coronary artery of transplanted heart with other forms of angina pectoris: Secondary | ICD-10-CM | POA: Diagnosis not present

## 2023-10-21 DIAGNOSIS — I10 Essential (primary) hypertension: Secondary | ICD-10-CM | POA: Insufficient documentation

## 2023-10-21 DIAGNOSIS — R0789 Other chest pain: Secondary | ICD-10-CM | POA: Insufficient documentation

## 2023-10-21 DIAGNOSIS — Z79899 Other long term (current) drug therapy: Secondary | ICD-10-CM | POA: Diagnosis not present

## 2023-10-21 DIAGNOSIS — Z0279 Encounter for issue of other medical certificate: Secondary | ICD-10-CM

## 2023-10-21 DIAGNOSIS — E1122 Type 2 diabetes mellitus with diabetic chronic kidney disease: Secondary | ICD-10-CM | POA: Insufficient documentation

## 2023-10-21 DIAGNOSIS — N183 Chronic kidney disease, stage 3 unspecified: Secondary | ICD-10-CM | POA: Diagnosis not present

## 2023-10-21 DIAGNOSIS — I5032 Chronic diastolic (congestive) heart failure: Secondary | ICD-10-CM | POA: Diagnosis not present

## 2023-10-21 DIAGNOSIS — I480 Paroxysmal atrial fibrillation: Secondary | ICD-10-CM | POA: Diagnosis not present

## 2023-10-21 MED ORDER — KERENDIA 10 MG PO TABS: 10.0000 mg | ORAL_TABLET | Freq: Every morning | ORAL | 3 refills | Status: DC

## 2023-10-21 NOTE — Telephone Encounter (Signed)
 Need to let pt know that the refill request was sent to Dr. Darrick Huntsman

## 2023-10-21 NOTE — Telephone Encounter (Signed)
 Copied from CRM [id number]. Topic: Clinical - Medication Question >> Oct 21, 2023  4:19 PM Elle L wrote: Reason for CRM: The patient states she spoke to someone in the office regarding a prescription for a muscle relaxer today and is requesting an update on this. Her call back number is [phone].

## 2023-10-21 NOTE — Telephone Encounter (Signed)
 Pt's husband requesting refill for Chauncey Mann be sent to [facility]. Refill sent as requested

## 2023-10-21 NOTE — Patient Instructions (Addendum)
 Medication Instructions:  Your physician recommends that you continue on your current medications as directed. Please refer to the Current Medication list given to you today.   *If you need a refill on your cardiac medications before your next appointment, please call your pharmacy*  Lab Work: Your provider would like for you to have following labs drawn today (BMP).    Testing/Procedures:   Please report to Radiology at [facility] Main Entrance, medical mall, 30 mins prior to your test.  [address]  [place], [place]  How to Prepare for Your Cardiac PET/CT Stress Test:  Nothing to eat or drink, except water, 3 hours prior to arrival time.  NO caffeine/decaffeinated products, or chocolate 12 hours prior to arrival. (Please note decaffeinated beverages (teas/coffees) still contain caffeine).  If you have caffeine within 12 hours prior, the test will need to be rescheduled.  Medication instructions: Do not take erectile dysfunction medications for 72 hours prior to test (sildenafil, tadalafil) Do not take nitrates (isosorbide mononitrate, Ranexa) the day before or day of test Do not take tamsulosin the day before or morning of test Hold theophylline containing medications for 12 hours. Hold Dipyridamole 48 hours prior to the test.  Diabetic Preparation: If able to eat breakfast prior to 3 hour fasting, you may take all medications, including your insulin. Do not worry if you miss your breakfast dose of insulin - start at your next meal. If you do not eat prior to 3 hour fast-Hold all diabetes (oral and insulin) medications. Patients who wear a continuous glucose monitor MUST remove the device prior to scanning.  You may take your remaining medications with water.  NO perfume, cologne or lotion on chest or abdomen area. FEMALES - Please avoid wearing dresses to this appointment.  Total time is 1 to 2 hours; you may want to bring reading material for  the waiting time.  IF YOU THINK YOU MAY BE PREGNANT, OR ARE NURSING PLEASE INFORM THE TECHNOLOGIST.  In preparation for your appointment, medication and supplies will be purchased.  Appointment availability is limited, so if you need to cancel or reschedule, please call the Radiology Department Scheduler at [phone] 24 hours in advance to avoid a cancellation fee of $100.00  What to Expect When you Arrive:  Once you arrive and check in for your appointment, you will be taken to a preparation room within the Radiology Department.  A technologist or Nurse will obtain your medical history, verify that you are correctly prepped for the exam, and explain the procedure.  Afterwards, an IV will be started in your arm and electrodes will be placed on your skin for EKG monitoring during the stress portion of the exam. Then you will be escorted to the PET/CT scanner.  There, staff will get you positioned on the scanner and obtain a blood pressure and EKG.  During the exam, you will continue to be connected to the EKG and blood pressure machines.  A small, safe amount of a radioactive tracer will be injected in your IV to obtain a series of pictures of your heart along with an injection of a stress agent.    After your Exam:  It is recommended that you eat a meal and drink a caffeinated beverage to counter act any effects of the stress agent.  Drink plenty of fluids for the remainder of the day and urinate frequently for the first couple of hours after the exam.  Your doctor will inform you of  your test results within 7-10 business days.  For more information and frequently asked questions, please visit our website: [email or web address]  For questions about your test or how to prepare for your test, please call: Cardiac Imaging Nurse Navigators Office: [phone]   Follow-Up: At [facility], you and your health needs are our priority.  As part of our continuing mission to  provide you with exceptional heart care, our providers are all part of one team.  This team includes your primary Cardiologist (physician) and Advanced Practice Providers or APPs (Physician Assistants and Nurse Practitioners) who all work together to provide you with the care you need, when you need it.  Your next appointment:   2 month(s)  Provider:   You may see [name], MD or one of the following Advanced Practice Providers on your designated Care Team:   Nicolasa Ducking, NP Ames Dura, PA-C Eula Listen, PA-C Cadence Plentywood, PA-C Charlsie Quest, NP Carlos Levering, NP    We recommend signing up for the patient portal called "MyChart".  Sign up information is provided on this After Visit Summary.  MyChart is used to connect with patients for Virtual Visits (Telemedicine).  Patients are able to view lab/test results, encounter notes, upcoming appointments, etc.  Non-urgent messages can be sent to your provider as well.   To learn more about what you can do with MyChart, go to [email or web address].

## 2023-10-21 NOTE — Telephone Encounter (Signed)
 LMTCB

## 2023-10-21 NOTE — Telephone Encounter (Signed)
 Prescription Request  10/21/2023  LOV: 10/09/2023  What is the name of the medication or equipment?  tiZANidine (ZANAFLEX) 4 MG tablet    Have you contacted your pharmacy to request a refill? No   Which pharmacy would you like this sent to?  [id number] [place], [place] - [address] [place] [address] Phone: [phone] Fax: [phone]  [facility] - [place], [place] - [address] [place] [id number] Phone: [phone] Fax: [phone]    Patient notified that their request is being sent to the clinical staff for review and that they should receive a response within 2 business days.   Please advise at Mobile [phone] (mobile)  tiZANidine (ZANAFLEX) 4 MG tablet   *Patient has 1 pill left

## 2023-10-21 NOTE — Addendum Note (Signed)
 Addended by: Parke Poisson on: 10/21/2023 04:02 PM   Modules accepted: Orders

## 2023-10-21 NOTE — Addendum Note (Signed)
 Addended by: Sandy Salaam on: 10/21/2023 01:25 PM   Modules accepted: Orders

## 2023-10-21 NOTE — Telephone Encounter (Signed)
 Pt needs someone to call her in regards to her medication and [place]. Please advise and call for clarity. Thank you.

## 2023-10-21 NOTE — Progress Notes (Signed)
 Cardiology Office Note:  .   Date:  10/21/2023  ID:  Denise Sharp, DOB 04-Jul-1946, MRN [id number] PCP: Sherlene Shams, MD  [facility] Providers Cardiologist:  [name], MD     History of Present Illness: .   Denise Sharp is a 78 y.o. female with a hx of HFpEF, CAD s/p PCI to the LAD, persistent Afib, DM2, CKD, OSA on CPAP and morbid obesity who presents for follow-up for CAD and CHF.    Echo 03/25/12: EF 55-65% with mild AR Echo 08/21/17: EF 65-70% with Grade II DD and mildly elevated PA pressure of 33 mmHg Echo 12/30/19: EF 65-70% with Grade II DD Echo 03/29/23: EF 60-65% with mild LVH, Grade II DD   RHC/ LHC 06/04/22: Mild-moderate, non-obstructive coronary artery disease, similar to prior catheterization in 08/2020. Widely patent ostial LAD stent. Patent mid LAD stent with minimal in stent restenosis (~10% narrowing). Upper normal left and right heart filling pressures. Borderline elevated pulmonary artery pressure. Normal to mildly reduced cardiac output/index.   The patient was admitted in September 2024 due to 30 pound weight gain requiring IV diuresis at that time she was taking Lasix 20 mg once daily.  She was admitted to [facility] where echo showed preserved EF with grade 2 diastolic dysfunction.  She was diuresed 4 L and transition to torsemide 20 mg daily.  Throughout admission she maintained normal sinus rhythm.  She did have some rectal bleeding that was secondary to hemorrhoids.  She is needing a colonoscopy.   Patient has been followed by advanced heart failure team.  She was last seen May 24, 2023 reporting stable weights 241 to 243 pounds.  She was wearing ZOLL heart failure monitoring device.  She was taking torsemide 60 mg most days.she last saw CHF team 07/01/23 and finerenone was refilled. She was on torsemide BID, and this was decreased to 60mg  daily.  Today, the patient rpeorts she has occasional chest pain. It can occur at any time at rest.  Vitals are normally stable. It's not worse with exercise. She has chronic low energy. She was told she might have gout. She was given prednisone. She will see rheumatology in May. She is fluctuating between 60 and 120 mg daily, mostly takes 80mg  daily. She had the Flu and had to cancel apt with heart failure clinic.  She denies bleeding issues   Studies Reviewed: [name]   EKG Interpretation Date/Time:  Monday October 21 2023 13:55:38 EDT Ventricular Rate:  78 PR Interval:  150 QRS Duration:  70 QT Interval:  422 QTC Calculation: 481 R Axis:   8  Text Interpretation: Sinus rhythm with marked sinus arrhythmia Prolonged QT When compared with ECG of 17-Jun-2023 13:38, No significant change was found Confirmed by Fransico Michael, Enes Rokosz ([id number]) on 10/21/2023 1:56:34 PM Also confirmed by Fransico Michael, Uma Jerde ([id number])  on 10/21/2023 2:51:29 PM      Echo 03/2023  1. Left ventricular ejection fraction, by estimation, is 60 to 65%. The  left ventricle has normal function. The left ventricle has no regional  wall motion abnormalities. There is mild left ventricular hypertrophy.  Left ventricular diastolic parameters  are consistent with Grade II diastolic dysfunction (pseudonormalization).   2. Right ventricular systolic function is normal. The right ventricular  size is normal.   3. Left atrial size was mild to moderately dilated.   4. Right atrial size was mildly dilated.   5. The mitral valve is degenerative. No evidence of mitral valve  regurgitation.  6. The aortic valve was not well visualized. Aortic valve regurgitation  is mild.    R/L heart cath 05/2022   Conclusions: Mild-moderate, non-obstructive coronary artery disease, similar to prior catheterization in 08/2020. Widely patent ostial LAD stent. Patent mid LAD stent with minimal in stent restenosis (~10% narrowing). Upper normal left and right heart filling pressures. Borderline elevated pulmonary artery pressure. Normal to mildly reduced cardiac  output/index.   Recommendations: No obvious findings to explain the patient's progressive symptoms and NSVT on recent event monitor. Restart metoprolol succinate 25 mg daily. Aggressive secondary prevention of coronary artery disease and medical therapy for chronic HFpEF.   [name], MD [facility]     Physical Exam:   VS:  BP 124/72 (BP Location: Right Arm, Patient Position: Sitting, Cuff Size: Normal)   Pulse 84   Resp (!) 22   Ht 5' 4.5" (1.638 m)   Wt 242 lb (109.8 kg)   SpO2 96%   BMI 40.90 kg/m    Wt Readings from Last 3 Encounters:  10/21/23 242 lb (109.8 kg)  10/09/23 247 lb (112 kg)  09/30/23 244 lb (110.7 kg)    GEN: Well nourished, well developed in no acute distress NECK: No JVD; No carotid bruits CARDIAC: RRR, no murmurs, rubs, gallops RESPIRATORY:  Clear to auscultation without rales, wheezing or rhonchi  ABDOMEN: Soft, non-tender, non-distended EXTREMITIES:  No edema; No deformity   ASSESSMENT AND PLAN: .    Chronic HFpEF The patient reports volume is status is un and down, this was exacerbated by recent taper of prednisone for gout. She has 1+ pedal edema. She normally takes torsemide 80mg  daily, but sometimes 120mg  in a day. I will check a BMET today. Continue Farxiga 10mg  daily and finerenone 10mg  daily. I will refill finerenone. BB stopped due to hypotension.  She follows with the heart failure clinic with appointment later this week.  HTN BP normal today, continue current medications.   CAD She reports more atypical chest pain. EKG shows no ischemic changes. I will order a cardiac PET stress. Heart caht in 2023 showed mild to moderate non-obstructive CAD, similar to prior cath in 2022, widely patent ostial LAD stent, patent mid LAD stent with minimal ISR. No ASA given Eliquis. Continue Lipitor 80mg  daily, Zetia 10mg  daily,   Pafib She is in NSR. Continue Eliquis 5mg  BID for stroke ppx. She denies bleeding issues.   CKD Stage 3 Scr 1.51 on  most recent draw. Re-check BMET today. She follows with nephrology.     Informed Consent   Shared Decision Making/Informed Consent The risks [chest pain, shortness of breath, cardiac arrhythmias, dizziness, blood pressure fluctuations, myocardial infarction, stroke/transient ischemic attack, nausea, vomiting, allergic reaction, radiation exposure, metallic taste sensation and life-threatening complications (estimated to be 1 in 10,000)], benefits (risk stratification, diagnosing coronary artery disease, treatment guidance) and alternatives of a cardiac PET stress test were discussed in detail with Ms. Jarold Motto and she agrees to proceed.     Dispo: Follow-up in 2 months  Signed, Roshini Fulwider David Stall, PA-C

## 2023-10-21 NOTE — Telephone Encounter (Signed)
 Patient paid $29 cash filled out paperwork & put in nurse box.

## 2023-10-21 NOTE — Telephone Encounter (Signed)
 Refilled: 04/29/2023 Last OV: 10/09/2023 Next OV: not scheduled

## 2023-10-22 ENCOUNTER — Telehealth: Payer: Self-pay | Admitting: *Deleted

## 2023-10-22 ENCOUNTER — Other Ambulatory Visit: Payer: Self-pay | Admitting: *Deleted

## 2023-10-22 ENCOUNTER — Telehealth: Payer: Self-pay | Admitting: Family

## 2023-10-22 DIAGNOSIS — E1142 Type 2 diabetes mellitus with diabetic polyneuropathy: Secondary | ICD-10-CM

## 2023-10-22 LAB — BASIC METABOLIC PANEL WITH GFR
BUN/Creatinine Ratio: 21 (ref 12–28)
BUN: 30 mg/dL — ABNORMAL HIGH (ref 8–27)
CO2: 21 mmol/L (ref 20–29)
Calcium: 8.8 mg/dL (ref 8.7–10.3)
Chloride: 93 mmol/L — ABNORMAL LOW (ref 96–106)
Creatinine, Ser: 1.42 mg/dL — ABNORMAL HIGH (ref 0.57–1.00)
Glucose: 239 mg/dL — ABNORMAL HIGH (ref 70–99)
Potassium: 3.8 mmol/L (ref 3.5–5.2)
Sodium: 137 mmol/L (ref 134–144)
eGFR: 38 mL/min/{1.73_m2} — ABNORMAL LOW (ref >59)

## 2023-10-22 MED ORDER — TIZANIDINE HCL 4 MG PO TABS: 4.0000 mg | ORAL_TABLET | Freq: Four times a day (QID) | ORAL | 0 refills | Status: DC | PRN

## 2023-10-22 MED ORDER — TRESIBA FLEXTOUCH 100 UNIT/ML ~~LOC~~ SOPN: 15.0000 [IU] | PEN_INJECTOR | Freq: Every day | SUBCUTANEOUS | 3 refills | Status: DC

## 2023-10-22 NOTE — Telephone Encounter (Incomplete)
 Called to confirm/remind patient of their appointment at the Advanced Heart Failure Clinic on 10/23/23***.   Appointment:   [] Confirmed  [x] Left mess   [] No answer/No voice mail  [] Phone not in service  Patient reminded to bring all medications and/or complete list.  Confirmed patient has transportation. Gave directions, instructed to utilize valet parking.

## 2023-10-22 NOTE — Telephone Encounter (Signed)
 Talked with Denise Sharp. She get her medication from [facility]. They advised the patient that the Evaristo Bury was on a back order. Patient is asking that we send in a prescription to [facility] on [address] in [place]. This has been done.  Patient then shared that she had completed the Prednisone. Her blood sugars are running in the 300 -400's, that it is not going back to normal. Her PCP advised her that it may take longer for her to see a difference.  Denise Sharp shares that she has been giving herself more insulins. Novolog was 3-10 units she is now giving 14-16 units three times a day. The [place] she was injecting 15 units but now she is injecting 16 units. She says this is not helping.

## 2023-10-22 NOTE — Telephone Encounter (Signed)
 Pt called back and I relayed your message & explained that the request is still pending.

## 2023-10-22 NOTE — Progress Notes (Unsigned)
 Advanced Heart Failure Clinic Note   PCP: Sherlene Shams, MD (last seen 10/24) Cardiologist: [name], MD (last seen 12/24) HF provider: Dorthula Nettles, MD (last seen 12/24)  Chief Complaint: fatigue  HPI:  Denise Sharp is a 78 y/o female with a history of HFpEF, CAD status post PCI to the LAD, persistent atrial fibrillation, type 2 diabetes, CKD and morbid obesity presenting today for follow up.  Admitted 03/28/23 due to 30 pound weight gain requiring IV diuresis.  At that time she was taking Lasix 20 mg once daily to once every other day.  She was admitted to [facility] where echocardiogram demonstrated preserved EF with grade 2 diastolic dysfunction.  She was diuresed 4 L through admission and transition to torsemide 20 mg daily at discharge.  Throughout admission she maintained normal sinus rhythm.  She did have rectal bleeding that was secondary to hemorrhoids.  Echo 03/25/12: EF 55-65% with mild AR Echo 08/21/17: EF 65-70% with Grade II DD and mildly elevated PA pressure of 33 mmHg Echo 12/30/19: EF 65-70% with Grade II DD Echo 03/29/23: EF 60-65% with mild LVH, Grade II DD  RHC/ LHC 06/04/22: Mild-moderate, non-obstructive coronary artery disease, similar to prior catheterization in 08/2020. Widely patent ostial LAD stent. Patent mid LAD stent with minimal in stent restenosis (~10% narrowing). Upper normal left and right heart filling pressures. Borderline elevated pulmonary artery pressure. Normal to mildly reduced cardiac output/index.  She presents today for a HF follow-up visit with a chief complaint of moderate fatigue with exertion. Chronic in nature. Has associated dry cough, chronic dizziness, pedal edema, difficulty sleeping (due to urination) and swelling in hands (possibly due to arthritis) along with this. Denies shortness of breath, chest pain, cough, palpitations, abdominal distention or weight gain.   At last visit, her torsemide was decreased to 20mg  AM/ 60mg  PM  with additional 20-40mg  during the day if needed. Has been taking 120mg  torsemide daily usually 60mg  BID but will occasionally take less in AM/ more PM. Last night went to [employer] for supper. She says that she likes to take more of her dose in the evening because if she takes it in the morning she is "urinating all the time and can't go anywhere".   Was told she couldn't participate in pulmonary rehab due to multiple orthopedic issues along with urinary incontinence. It was recommended that she look into home physical therapy.   Still wearing Zoll HF Management system device until 08/03/23 (90 days of wear time)  Past Medical History:  Diagnosis Date   (HFpEF) heart failure with preserved ejection fraction (HCC)    a. 12/2019 Echo: EF 65-70%, Gr2 DD. No significant valvular dzs.   Abnormality of gait 03/25/2013   Adrenal mass, left (HCC)    Anginal pain (HCC)    Anxiety    Aortic atherosclerosis (HCC)    Arthritis    Asthma    CAD (coronary artery disease) with h/o Atypical Chest Pain    a. 04/1986 Cath ([facility]): nl cors, EF 65%; b. 07/2012 Cath [facility]): Diff minor irregs; c. 07/2016 MV [facility]): "Equivocal"; d. 08/2016 Cardiac CT Ca2+ score [facility]): Ca2+ 1548; e. 05/2019 MV: No ischemia. EF 75%; f. 12/2019 PCI: LM nl, LAD 65ost (3.5x12 Resolute DES), 83m (2.75x12 Resolute DES),LCX/RCA nl; g. 08/2020 Cath: patent LAD stents, RCA 40ost, elev RH pressures, EF >65%.   Cervical spinal stenosis 1994   due to trauma to back ([employer] accident), has intermittent paralysis and parasthesias   Cervicogenic headache 03/23/2014  CKD (chronic kidney disease), stage III (HCC)    Depression    Diverticulosis    Dizziness    a.) chronic   DJD (degenerative joint disease)    a. Chronic R shoulder pain   Dyspnea    Esophageal stenosis 09/2009   a.) transient outlet obstruction by food, cleared by EGD   Family history of adverse reaction to anesthesia    a.) daughter with (+) PONV   Gastric bypass status for  obesity    GERD (gastroesophageal reflux disease)    Headache(784.0)    HLD (hyperlipidemia)    Hypertension    IBS (irritable bowel syndrome)    IDA (iron deficiency anemia)    a.) post 2 unit txfsn 2009, normal endo/colonoscopy by [facility]   Left bundle branch block (LBBB)    a.) Intermittently present - likely rate related.   NSVT (nonsustained ventricular tachycardia) (HCC) 11/15/2020   a.) Holter study 11/15/2020; 11 episodes of NSVT lasting up to 8 beats with a maximum rate of 210 bpm; 14 atrial runs lasting up to 18 beats with a rate of up to 250 bpm.  Some atrial runs felt to be NSVT.   NSVT (nonsustained ventricular tachycardia) (HCC)    a. 10/2020 Zio: 11 runs of NSVT up to 8 beats.   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 10/2020 Zio: 14 atrial runs up to 18 beats, max HR 250.   Restless leg syndrome    Rotator cuff arthropathy, right 08/13/2017   Syncope and collapse 03/12/2014   Type II diabetes mellitus (HCC)     Current Outpatient Medications  Medication Sig Dispense Refill   apixaban (ELIQUIS) 5 MG TABS tablet Take 1 tablet (5 mg total) by mouth 2 (two) times daily. 180 tablet 1   atorvastatin (LIPITOR) 80 MG tablet Take 1 tablet (80 mg total) by mouth daily. 90 tablet 1   budesonide-formoterol (SYMBICORT) 160-4.5 MCG/ACT inhaler Inhale 2 puffs into the lungs 2 (two) times daily. 1 each 6   butalbital-acetaminophen-caffeine (FIORICET) 50-325-40 MG tablet TAKE 1 TABLET BY MOUTH EVERY 6 HOURS AS NEEDED FOR HEADACHE. 60 tablet 2   Cholecalciferol (VITAMIN D3) 250 MCG (10000 UT) capsule Take 10,000 Units by mouth daily.     clotrimazole-betamethasone (LOTRISONE) cream Apply 1 application topically 2 (two) times daily as needed (irritation).     conjugated estrogens (PREMARIN) vaginal cream Apply one pea-sized amount around  the opening of the urethra daily for 2 weeks, then 3 times weekly moving forward. 30 g 4   Continuous Blood Gluc Receiver (FREESTYLE LIBRE 2 READER) DEVI Use to check sugar at least TID 1 each 1   Continuous Glucose Sensor (FREESTYLE LIBRE 3 PLUS SENSOR) MISC Change sensor every 15 days. 6 each 3   cyanocobalamin (VITAMIN B12) 1000 MCG/ML injection Inject 1 mL (1,000 mcg total) into the muscle every 30 (thirty) days. Inject 1 ml (1000 mcg ) IM weekly x 4,  Then monthly thereafter 3 mL 1   dapagliflozin propanediol (FARXIGA) 10 MG TABS tablet Take 1 tablet (10 mg total) by mouth daily. 90 tablet 3   dicyclomine (BENTYL) 20 MG tablet Take 1 tablet (20 mg total) by mouth 3 (three) times daily before meals. 180 tablet 2   DULoxetine (CYMBALTA) 60 MG capsule Take 1 capsule (60 mg total) by mouth daily. 90  capsule 3   ezetimibe (ZETIA) 10 MG tablet Take 1 tablet (10 mg total) by mouth daily. 90 tablet 3   fexofenadine (ALLEGRA) 180 MG tablet Take 180 mg by mouth as needed for allergies or rhinitis.     Finerenone (KERENDIA) 10 MG TABS Take 1 tablet (10 mg total) by mouth in the morning. 90 tablet 3   fluticasone (FLONASE) 50 MCG/ACT nasal spray Place 2 sprays into both nostrils daily. 16 g 6   Glucagon (GVOKE HYPOPEN 2-PACK) 0.5 MG/0.1ML SOAJ Inject 1 auto injector subcutaneously as needed for severe hypoglycemia. May repeat x 1 after 15 minutes if needed. 0.1 mL 2   glucose blood (ONETOUCH VERIO) test strip Use to check blood sugar twice daily. 100 each 1   hydrocortisone (ANUSOL-HC) 25 MG suppository Place 1 suppository (25 mg total) rectally 2 (two) times daily. 24 suppository 5   insulin aspart (NOVOLOG FLEXPEN) 100 UNIT/ML FlexPen Inject 10-16 Units into the skin 3 (three) times daily with meals. 36 mL 3   insulin degludec (TRESIBA FLEXTOUCH) 100 UNIT/ML FlexTouch Pen Inject 15 Units into the skin daily. 15 mL 3   Insulin Pen Needle (DROPLET PEN NEEDLES) 31G X 5 MM MISC USE ONE NEEDLE SUBCUTANEOUSLY  AS DIRECTED (REMOVE AND DISCARD NEEDLE IN SHARPS CONTAINER IMMEDIATELY AFTER USE) 100 each 1   lipase/protease/amylase (CREON) 36000 UNITS CPEP capsule Take 2 capsules (72,000 Units total) by mouth 3 (three) times daily before meals AND 1 capsule (36,000 Units total) with snacks. 300 capsule 9   loperamide (IMODIUM) 2 MG capsule Take 4-6 mg by mouth 3 (three) times daily before meals.     nitroGLYCERIN (NITROSTAT) 0.4 MG SL tablet Place 1 tablet (0.4 mg total) under the tongue every 5 (five) minutes as needed for chest pain. 25 tablet 5   nystatin (MYCOSTATIN/NYSTOP) powder Apply 1 g topically 4 (four) times daily as needed (rash).     ondansetron (ZOFRAN-ODT) 4 MG disintegrating tablet Take 1 tablet (4 mg total) by mouth every 8 (eight) hours as needed for nausea or vomiting. 30 tablet 0   OneTouch Delica Lancets 33G MISC Used to check blood sugar two times a day. 100 each 4   OVER THE COUNTER MEDICATION Apply 1 application  topically daily as needed (pain). [place] Gel topical pain reliever     pantoprazole (PROTONIX) 40 MG tablet Take 1 tablet (40 mg total) by mouth daily. 90 tablet 3   Polyethyl Glycol-Propyl Glycol (SYSTANE OP) Place 1 drop into both eyes 4 (four) times daily as needed (dry eyes).     potassium chloride 20 MEQ/15ML (10%) SOLN Take 15 mLs (20 mEq total) by mouth daily. 473 mL 3   predniSONE (DELTASONE) 10 MG tablet 6 tablets on Day 1 , then reduce by 1 tablet daily until gone (Patient not taking: Reported on 10/21/2023) 21 tablet 0   pregabalin (LYRICA) 50 MG capsule Take 1 capsule (50 mg total) by mouth 2 (two) times daily. 180 capsule 1   tiZANidine (ZANAFLEX) 4 MG tablet Take 1 tablet (4 mg total) by mouth every 6 (six) hours as needed for muscle spasms. 30 tablet 0   torsemide (DEMADEX) 20 MG tablet Take 3 tablets (60 mg total) by mouth 2 (two) times daily. 180 tablet 2   No current facility-administered medications for this visit.    Allergies  Allergen Reactions    Demeclocycline Hives   Erythromycin Nausea And Vomiting and Other (See Comments)    Severe irritable bowel   Flagyl [  Metronidazole] Nausea And Vomiting and Other (See Comments)    Severe irritable bowel   Glucophage [Metformin Hcl] Nausea And Vomiting and Other (See Comments)    "Sick" "I won't take anything that has metformin in it"   Tetracyclines & Related Hives and Rash   Potassium Chloride Other (See Comments)    Cannot swallow potssium pills, able to tolerate liquid potassium   Tetracycline Other (See Comments)   Diovan [Valsartan] Nausea Only        Sulfa Antibiotics Other (See Comments) and Rash    As child   Xanax [Alprazolam] Other (See Comments)    Hyperactivity       Social History   Socioeconomic History   Marital status: Married    Spouse name: Alinda Money    Number of children: 2   Years of education: College    Highest education level: Not on file  Occupational History    Employer: retired  Tobacco Use   Smoking status: Never    Passive exposure: Never   Smokeless tobacco: Never  Vaping Use   Vaping status: Never Used  Substance and Sexual Activity   Alcohol use: No   Drug use: Not Currently    Comment: prescribed valium   Sexual activity: Not Currently    Birth control/protection: Post-menopausal, Surgical  Other Topics Concern   Not on file  Social History Narrative   Patient lives at home with husband Alinda Money.    Right Handed   Drinks caffeinated tea occasionally   One pet in house, dog   Social Drivers of Health   Financial Resource Strain: Low Risk  (09/15/2021)   Overall Financial Resource Strain (CARDIA)    Difficulty of Paying Living Expenses: Not hard at all  Food Insecurity: No Food Insecurity (04/05/2023)   Hunger Vital Sign    Worried About Running Out of Food in the Last Year: Never true    Ran Out of Food in the Last Year: Never true  Transportation Needs: No Transportation Needs (04/05/2023)   PRAPARE - Scientist, research (physical sciences) (Medical): No    Lack of Transportation (Non-Medical): No  Physical Activity: Insufficiently Active (11/15/2020)   Exercise Vital Sign    Days of Exercise per Week: 2 days    Minutes of Exercise per Session: 40 min  Stress: Stress Concern Present (08/03/2020)   [employer] of Occupational Health - Occupational Stress Questionnaire    Feeling of Stress : Rather much  Social Connections: Not on file  Intimate Partner Violence: Not At Risk (03/28/2023)   Humiliation, Afraid, Rape, and Kick questionnaire    Fear of Current or Ex-Partner: No    Emotionally Abused: No    Physically Abused: No    Sexually Abused: No      Family History  Problem Relation Age of Onset   Heart disease Father    Hypertension Father    Prostate cancer Father    Stroke Father    Osteoporosis Father    Stroke Mother    Depression Mother    Headache Mother    Heart disease Mother    Thyroid disease Mother    Hypertension Mother    Diabetes Daughter    Heart disease Daughter    Hypertension Daughter    Hypertension Son    There were no vitals filed for this visit.  Wt Readings from Last 3 Encounters:  10/21/23 242 lb (109.8 kg)  10/09/23 247 lb (112 kg)  09/30/23 244 lb (110.7  kg)   Lab Results  Component Value Date   CREATININE 1.42 (H) 10/21/2023   CREATININE 1.53 (H) 07/26/2023   CREATININE 1.72 (H) 07/01/2023   PHYSICAL EXAM:   GENERAL: Well nourished, well developed, and in no apparent distress at rest.  HEENT: Negative for arcus senilis or xanthelasma. There is no scleral icterus.  The mucous membranes are pink and moist.   NECK: Supple, No masses. Normal carotid upstrokes without bruits. No masses or thyromegaly.    CHEST: There are no chest wall deformities. There is no chest wall tenderness. Respirations are unlabored.  Lungs- cta b/l CARDIAC:  JVP: 7 cm          Normal rate with regular rhythm. No murmurs, rubs or gallops. Trace pitting edema.  ABDOMEN: Soft,  non-tender, non-distended. There are no masses or hepatomegaly.  EXTREMITIES: Warm and well perfused with no cyanosis, clubbing.  NEUROLOGIC: Patient is oriented x3 with no focal or lateralizing neurologic deficits.  PSYCH: Patients affect is appropriate, there is no evidence of anxiety or depression.  SKIN: Warm and dry; no lesions or wounds.   ECG: Not done   ASSESSMENT & PLAN:  1: NICM with preserved ejection fraction- - suspect due to obesity, atrial fibrillation, vascular disease, hypertension, CKD - NYHA class III: symptoms 2/2 deconditioning. - euvolemic - weight stable from last visit here 3 weeks ago - Echo 03/25/12: EF 55-65% with mild AR - Echo 08/21/17: EF 65-70% with Grade II DD and mildly elevated PA pressure of 33 mmHg - Echo 12/30/19: EF 65-70% with Grade II DD - Echo 03/29/23: EF 60-65% with mild LVH, Grade II DD - continue finerenone 10mg  daily - discussed taking the least amount of torsemide needed to control her symptoms; she says that she's been taking 60mg  BID but understands to try doing 20mg  AM/ 60mg  PM - already [facility] - still has zoll HF management system device on - BNP 07/01/23 was 40.6  2: HTN- - BP 117/56 - saw PCP Darrick Huntsman) 10/24 - BMET 07/01/23 showed sodium 134, potassium 4.0, creatinine 1.72 & GFR 30 - BMET tomorrow w/ cancer center labs  3: PAF- - saw cardiology (Further) 12/24  4: OSA- - had video visit with pulmonology (Cobb) 10/24 - compliant with CPAP.   5: Rectal bleeding- - saw GI Gerre Pebbles) 08/24.  - Following with GI with plan to undergo colonoscopy  6: Iron deficiency anemia- - saw hematology Donneta Romberg) 09/24 - hemoglobin 04/08/23 was 14.2  7: DM- - saw endocrinology Lurlean Leyden) 12/24 - A1c 06/24/23 was 9.5%  8. CKD IIIB - With underlying T2DM, HFpEF will continue finerenone 10mg .  - saw nephrology (Korrapati) 10/24  9. Deconditioning - she did not qualify for pulmonary rehab due to  multiple orthopaedic issues along with severe incontinence - rehab recommended home physical therapy instead - she is gathering personal recommendations regarding home PT as she doesn't want to use her former home health agency   Already has HF follow-up appt scheduled next month. Return sooner if needed.

## 2023-10-22 NOTE — Addendum Note (Signed)
 Addended by: Sandy Salaam on: 10/22/2023 03:34 PM   Modules accepted: Orders

## 2023-10-22 NOTE — Telephone Encounter (Signed)
 Refilled: 04/29/2023 Last OV: 10/09/2022 Next OV: not scheduled

## 2023-10-23 ENCOUNTER — Encounter: Payer: Self-pay | Admitting: Family

## 2023-10-23 ENCOUNTER — Ambulatory Visit: Attending: Family | Admitting: Family

## 2023-10-23 VITALS — BP 122/54 | HR 77 | Wt 243.0 lb

## 2023-10-23 DIAGNOSIS — D509 Iron deficiency anemia, unspecified: Secondary | ICD-10-CM | POA: Diagnosis not present

## 2023-10-23 DIAGNOSIS — I428 Other cardiomyopathies: Secondary | ICD-10-CM | POA: Insufficient documentation

## 2023-10-23 DIAGNOSIS — I10 Essential (primary) hypertension: Secondary | ICD-10-CM | POA: Diagnosis not present

## 2023-10-23 DIAGNOSIS — R600 Localized edema: Secondary | ICD-10-CM | POA: Insufficient documentation

## 2023-10-23 DIAGNOSIS — R5383 Other fatigue: Secondary | ICD-10-CM | POA: Diagnosis not present

## 2023-10-23 DIAGNOSIS — R252 Cramp and spasm: Secondary | ICD-10-CM | POA: Diagnosis not present

## 2023-10-23 DIAGNOSIS — N1832 Chronic kidney disease, stage 3b: Secondary | ICD-10-CM | POA: Insufficient documentation

## 2023-10-23 DIAGNOSIS — E1142 Type 2 diabetes mellitus with diabetic polyneuropathy: Secondary | ICD-10-CM | POA: Diagnosis not present

## 2023-10-23 DIAGNOSIS — E1122 Type 2 diabetes mellitus with diabetic chronic kidney disease: Secondary | ICD-10-CM | POA: Diagnosis not present

## 2023-10-23 DIAGNOSIS — G4733 Obstructive sleep apnea (adult) (pediatric): Secondary | ICD-10-CM

## 2023-10-23 DIAGNOSIS — I2510 Atherosclerotic heart disease of native coronary artery without angina pectoris: Secondary | ICD-10-CM | POA: Diagnosis not present

## 2023-10-23 DIAGNOSIS — Z955 Presence of coronary angioplasty implant and graft: Secondary | ICD-10-CM | POA: Diagnosis not present

## 2023-10-23 DIAGNOSIS — Z794 Long term (current) use of insulin: Secondary | ICD-10-CM | POA: Insufficient documentation

## 2023-10-23 DIAGNOSIS — N183 Chronic kidney disease, stage 3 unspecified: Secondary | ICD-10-CM

## 2023-10-23 DIAGNOSIS — Z7901 Long term (current) use of anticoagulants: Secondary | ICD-10-CM | POA: Insufficient documentation

## 2023-10-23 DIAGNOSIS — Z7984 Long term (current) use of oral hypoglycemic drugs: Secondary | ICD-10-CM | POA: Diagnosis not present

## 2023-10-23 DIAGNOSIS — I130 Hypertensive heart and chronic kidney disease with heart failure and stage 1 through stage 4 chronic kidney disease, or unspecified chronic kidney disease: Secondary | ICD-10-CM | POA: Diagnosis not present

## 2023-10-23 DIAGNOSIS — I480 Paroxysmal atrial fibrillation: Secondary | ICD-10-CM | POA: Diagnosis not present

## 2023-10-23 DIAGNOSIS — I5032 Chronic diastolic (congestive) heart failure: Secondary | ICD-10-CM

## 2023-10-23 DIAGNOSIS — Z79899 Other long term (current) drug therapy: Secondary | ICD-10-CM | POA: Insufficient documentation

## 2023-10-23 DIAGNOSIS — R5381 Other malaise: Secondary | ICD-10-CM | POA: Diagnosis not present

## 2023-10-23 NOTE — Telephone Encounter (Signed)
 Forms completed by Cadence and given to [facility] for processing.

## 2023-10-23 NOTE — Telephone Encounter (Signed)
 Called patient forms ready for pick up at front desk

## 2023-10-23 NOTE — Patient Instructions (Addendum)
 Special Instructions // Education:  Try taking over the counter magnesium for the leg cramps.   Follow-Up in: Dr. Nyoka Cowden schedule is NOT open yet. We will place you on our recall list. Once they are available, we will call you to schedule your follow up appointment.   At the [facility], you and your health needs are our priority. We have a designated team specialized in the treatment of Heart Failure. This Care Team includes your primary Heart Failure Specialized Cardiologist (physician), Advanced Practice Providers (APPs- Physician Assistants and Nurse Practitioners), and Pharmacist who all work together to provide you with the care you need, when you need it.   You may see any of the following providers on your designated Care Team at your next follow up:  Dr. Arvilla Meres Dr. Marca Ancona Dr. Dorthula Nettles Dr. Theresia Bough Clarisa Kindred, FNP Enos Fling, RPH-CPP  Please be sure to bring in all your medications bottles to every appointment.   Need to Contact us:  If you have any questions or concerns before your next appointment please send [place] a message through [place] or call our office at [phone].    TO LEAVE A MESSAGE FOR THE NURSE SELECT OPTION 2, PLEASE LEAVE A MESSAGE INCLUDING: YOUR NAME DATE OF BIRTH CALL BACK NUMBER REASON FOR CALL**this is important as we prioritize the call backs  YOU WILL RECEIVE A CALL BACK THE SAME DAY AS LONG AS YOU CALL BEFORE 4:00 PM

## 2023-10-23 NOTE — Addendum Note (Signed)
 Addended by: Marianne Sofia on: 10/23/2023 01:05 PM   Modules accepted: Orders

## 2023-10-23 NOTE — Telephone Encounter (Signed)
 Have her increase her Denise Sharp to 30 units for now too to help bring down the spikes from the Prednisone.

## 2023-10-24 NOTE — Telephone Encounter (Signed)
Patient was called and a message was left on her voicemail with Whitney's recommendation.

## 2023-10-30 ENCOUNTER — Telehealth: Payer: Self-pay | Admitting: Nurse Practitioner

## 2023-10-30 ENCOUNTER — Telehealth: Payer: Self-pay

## 2023-10-30 ENCOUNTER — Other Ambulatory Visit: Payer: Self-pay | Admitting: *Deleted

## 2023-10-30 DIAGNOSIS — E1142 Type 2 diabetes mellitus with diabetic polyneuropathy: Secondary | ICD-10-CM

## 2023-10-30 MED ORDER — METOLAZONE 2.5 MG PO TABS: 2.5000 mg | ORAL_TABLET | Freq: Every day | ORAL | 0 refills | Status: DC

## 2023-10-30 MED ORDER — TRESIBA FLEXTOUCH 100 UNIT/ML ~~LOC~~ SOPN: 30.0000 [IU] | PEN_INJECTOR | Freq: Every day | SUBCUTANEOUS | 0 refills | Status: DC

## 2023-10-30 NOTE — Telephone Encounter (Signed)
 Pt lvm and states she only wants a call from Chassell or her nurse.  She is questioning why Horace Lye has not been called in, although I see it had been, but she states she also has a medical question and wants a call back today.

## 2023-10-30 NOTE — Telephone Encounter (Signed)
 I have talked with the patient. She was advised that a prescription was sent in the the [facility] on [address] in [place].

## 2023-10-30 NOTE — Telephone Encounter (Signed)
 Agreed -

## 2023-10-30 NOTE — Telephone Encounter (Signed)
 Pt called to report swelling in legs L>R. Says pcp refuses to treat gout. Has appointment with rheumatology in June. Pt concerned about the swelling. Pt voiced extreme frustration about "being passed off by providers". Per Shawnee Dellen, FNP pt to take metolazone 2.5mg  today and tomorrow only with 40meq of potassium today and tomorrow only. Pt verbalized agreement and understanding. No further questions at this time.

## 2023-11-01 ENCOUNTER — Telehealth: Payer: Self-pay

## 2023-11-01 NOTE — Progress Notes (Signed)
 Per Evonnie, RPH and Chapin, FNP - pt should increase Torsemide  to 80 MG BID, increase potassium to 10 MEQ every day, and follow up next week (preferably with a Dr. Unless there is no availability.  Pt agreed, had good understanding and no further questions.

## 2023-11-05 ENCOUNTER — Telehealth: Payer: Self-pay | Admitting: Family

## 2023-11-05 NOTE — Telephone Encounter (Signed)
 Called to confirm/remind patient of their appointment at the [facility] on 11/06/23.   Appointment:   [] Confirmed  [x] Left mess   [] No answer/No voice mail  [] VM Full/unable to leave message  [] Phone not in service  Patient reminded to bring all medications and/or complete list.  Confirmed patient has transportation. Gave directions, instructed to utilize valet parking.

## 2023-11-05 NOTE — Progress Notes (Unsigned)
 [facility] Note    PCP: Thersia Flax, MD (last seen 03/25) Cardiologist: Sammy Crisp, MD / Toribio Frees, PA (last seen 04/25) HF provider: Alwin Baars, MD (last seen 12/24)  Chief Complaint: shortness of breath  HPI:  Denise Sharp is a 78 y/o female with a history of HFpEF, CAD status post PCI to the LAD, persistent atrial fibrillation, type 2 diabetes, CKD and morbid obesity presenting today for follow up.  Echo 03/25/12: EF 55-65% with mild AR Echo 08/21/17: EF 65-70% with Grade II DD and mildly elevated PA pressure of 33 mmHg Echo 12/30/19: EF 65-70% with Grade II DD  RHC/ LHC 06/04/22: Mild-moderate, non-obstructive coronary artery disease, similar to prior catheterization in 08/2020. Widely patent ostial LAD stent. Patent mid LAD stent with minimal in stent restenosis (~10% narrowing). Upper normal left and right heart filling pressures. Borderline elevated pulmonary artery pressure. Normal to mildly reduced cardiac output/index.  Admitted 03/28/23 due to 30 pound weight gain requiring IV diuresis.  At that time she was taking Lasix  20 mg once daily to once every other day. She was admitted to [facility] where echocardiogram demonstrated preserved EF with grade 2 diastolic dysfunction. She was diuresed 4 L through admission and transition to torsemide  20 mg daily at discharge. Throughout admission she maintained normal sinus rhythm. She did have rectal bleeding that was secondary to hemorrhoids.  She presents today, with her husband, for a HF follow-up visit with a chief complaint of shortness of breath. Has fatigue, leg tremors, tingling along outer legs, bilateral knee pain, dizziness, decreased appetite and hyperglycemia along with this. She denies chest pain, palpitations or abdominal distention. Says that sometimes the tremors are so bad that she feels like she's having a seizure (which she has never been diagnosed with). Didn't urinate much with  metolazone  and feels like the 160mg  torsemide  was too much so she decreased it back to 80mg  BID. Has noticed some small knots in the calf of her left leg that are a little painful to touch.   Currently seeing neurologist every 6 months  ROS: All systems negative except what is listed in HPI, PMH and Problem List   Past Medical History:  Diagnosis Date   (HFpEF) heart failure with preserved ejection fraction (HCC)    a. 12/2019 Echo: EF 65-70%, Gr2 DD. No significant valvular dzs.   Abnormality of gait 03/25/2013   Adrenal mass, left (HCC)    Anginal pain (HCC)    Anxiety    Aortic atherosclerosis (HCC)    Arthritis    Asthma    CAD (coronary artery disease) with h/o Atypical Chest Pain    a. 04/1986 Cath ([facility]): nl cors, EF 65%; b. 07/2012 Cath [facility]): Diff minor irregs; c. 07/2016 MV [facility]): "Equivocal"; d. 08/2016 Cardiac CT Ca2+ score [facility]): Ca2+ 1548; e. 05/2019 MV: No ischemia. EF 75%; f. 12/2019 PCI: LM nl, LAD 65ost (3.5x12 Resolute DES), 60m (2.75x12 Resolute DES),LCX/RCA nl; g. 08/2020 Cath: patent LAD stents, RCA 40ost, elev RH pressures, EF >65%.   Cervical spinal stenosis 1994   due to trauma to back ([employer] accident), has intermittent paralysis and parasthesias   Cervicogenic headache 03/23/2014   CKD (chronic kidney disease), stage III (HCC)    Depression    Diverticulosis    Dizziness    a.) chronic   DJD (degenerative joint disease)    a. Chronic R shoulder pain   Dyspnea    Esophageal stenosis 09/2009   a.) transient outlet obstruction by  food, cleared by EGD   Family history of adverse reaction to anesthesia    a.) daughter with (+) PONV   Gastric bypass status for obesity    GERD (gastroesophageal reflux disease)    Headache(784.0)    HLD (hyperlipidemia)    Hypertension    IBS (irritable bowel syndrome)    IDA (iron  deficiency anemia)    a.) post 2 unit txfsn 2009, normal endo/colonoscopy by [facility]   Left bundle branch block (LBBB)    a.) Intermittently  present - likely rate related.   NSVT (nonsustained ventricular tachycardia) (HCC) 11/15/2020   a.) Holter study 11/15/2020; 11 episodes of NSVT lasting up to 8 beats with a maximum rate of 210 bpm; 14 atrial runs lasting up to 18 beats with a rate of up to 250 bpm.  Some atrial runs felt to be NSVT.   NSVT (nonsustained ventricular tachycardia) (HCC)    a. 10/2020 [facility]: 11 runs of NSVT up to 8 beats.   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 10/2020 [facility]: 14 atrial runs up to 18 beats, max HR 250.   Restless leg syndrome    Rotator cuff arthropathy, right 08/13/2017   Syncope and collapse 03/12/2014   Type II diabetes mellitus (HCC)     Current Outpatient Medications  Medication Sig Dispense Refill   apixaban  (ELIQUIS ) 5 MG TABS tablet Take 1 tablet (5 mg total) by mouth 2 (two) times daily. 180 tablet 1   atorvastatin  (LIPITOR ) 80 MG tablet Take 1 tablet (80 mg total) by mouth daily. 90 tablet 1   budesonide -formoterol  (SYMBICORT ) 160-4.5 MCG/ACT inhaler Inhale 2 puffs into the lungs 2 (two) times daily. 1 each 6   butalbital -acetaminophen -caffeine  (FIORICET ) 50-325-40 MG tablet TAKE 1 TABLET BY MOUTH EVERY 6 HOURS AS NEEDED FOR HEADACHE. 60 tablet 2   Cholecalciferol  (VITAMIN D3) 250 MCG (10000 UT) capsule Take 10,000 Units by mouth daily.     clotrimazole -betamethasone  (LOTRISONE ) cream Apply 1 application topically 2 (two) times daily as needed (irritation).     conjugated estrogens  (PREMARIN ) vaginal cream Apply one pea-sized amount around the opening of the urethra daily for 2 weeks, then 3 times weekly moving forward. 30 g 4   Continuous Blood Gluc Receiver (FREESTYLE LIBRE 2 READER) DEVI Use to check sugar at least TID 1 each 1   Continuous Glucose Sensor (FREESTYLE LIBRE 3 PLUS SENSOR) MISC Change sensor every 15 days. 6 each 3    cyanocobalamin  (VITAMIN B12) 1000 MCG/ML injection Inject 1 mL (1,000 mcg total) into the muscle every 30 (thirty) days. Inject 1 ml (1000 mcg ) IM weekly x 4,  Then monthly thereafter 3 mL 1   dapagliflozin  propanediol (FARXIGA ) 10 MG TABS tablet Take 1 tablet (10 mg total) by mouth daily. 90 tablet 3   dicyclomine  (BENTYL ) 20 MG tablet Take 1 tablet (20 mg total) by mouth 3 (three) times daily before meals. 180 tablet 2   DULoxetine  (CYMBALTA ) 60 MG capsule Take 1 capsule (60 mg total) by mouth daily. 90 capsule 3   ezetimibe  (ZETIA ) 10 MG tablet Take 1 tablet (10 mg total) by mouth daily. 90 tablet 3   fexofenadine (ALLEGRA) 180 MG tablet Take 180 mg by mouth as needed for allergies or rhinitis.     Finerenone  (KERENDIA ) 10 MG TABS Take 1 tablet (10 mg total)  by mouth in the morning. 90 tablet 3   fluticasone  (FLONASE ) 50 MCG/ACT nasal spray Place 2 sprays into both nostrils daily. 16 g 6   Glucagon  (GVOKE HYPOPEN  2-PACK) 0.5 MG/0.1ML SOAJ Inject 1 auto injector subcutaneously as needed for severe hypoglycemia. May repeat x 1 after 15 minutes if needed. 0.1 mL 2   glucose blood (ONETOUCH VERIO) test strip Use to check blood sugar twice daily. 100 each 1   hydrocortisone  (ANUSOL -HC) 25 MG suppository Place 1 suppository (25 mg total) rectally 2 (two) times daily. 24 suppository 5   insulin  aspart (NOVOLOG  FLEXPEN) 100 UNIT/ML FlexPen Inject 10-16 Units into the skin 3 (three) times daily with meals. 36 mL 3   insulin  degludec (TRESIBA  FLEXTOUCH) 100 UNIT/ML FlexTouch Pen Inject 30 Units into the skin daily. 30 mL 0   Insulin  Pen Needle (DROPLET PEN NEEDLES) 31G X 5 MM MISC USE ONE NEEDLE SUBCUTANEOUSLY AS DIRECTED (REMOVE AND DISCARD NEEDLE IN SHARPS CONTAINER IMMEDIATELY AFTER USE) 100 each 1   lipase/protease/amylase (CREON ) 36000 UNITS CPEP capsule Take 2 capsules (72,000 Units total) by mouth 3 (three) times daily before meals AND 1 capsule (36,000 Units total) with snacks. 300 capsule 9    loperamide  (IMODIUM ) 2 MG capsule Take 4-6 mg by mouth 3 (three) times daily before meals.     metolazone  (ZAROXOLYN ) 2.5 MG tablet Take 1 tablet (2.5 mg total) by mouth daily. Take potassium with this medication 2 tablet 0   nitroGLYCERIN  (NITROSTAT ) 0.4 MG SL tablet Place 1 tablet (0.4 mg total) under the tongue every 5 (five) minutes as needed for chest pain. 25 tablet 5   nystatin  (MYCOSTATIN /NYSTOP ) powder Apply 1 g topically 4 (four) times daily as needed (rash).     ondansetron  (ZOFRAN -ODT) 4 MG disintegrating tablet Take 1 tablet (4 mg total) by mouth every 8 (eight) hours as needed for nausea or vomiting. 30 tablet 0   OneTouch Delica Lancets 33G MISC Used to check blood sugar two times a day. 100 each 4   OVER THE COUNTER MEDICATION Apply 1 application  topically daily as needed (pain). [place] Gel topical pain reliever     pantoprazole  (PROTONIX ) 40 MG tablet Take 1 tablet (40 mg total) by mouth daily. 90 tablet 3   Polyethyl Glycol-Propyl Glycol (SYSTANE OP) Place 1 drop into both eyes 4 (four) times daily as needed (dry eyes).     potassium chloride  20 MEQ/15ML (10%) SOLN Take 15 mLs (20 mEq total) by mouth daily. 473 mL 3   pregabalin  (LYRICA ) 50 MG capsule Take 1 capsule (50 mg total) by mouth 2 (two) times daily. 180 capsule 1   tiZANidine  (ZANAFLEX ) 4 MG tablet Take 1 tablet (4 mg total) by mouth every 6 (six) hours as needed for muscle spasms. 30 tablet 0   torsemide  (DEMADEX ) 20 MG tablet Take 3 tablets (60 mg total) by mouth 2 (two) times daily. 180 tablet 2   No current facility-administered medications for this visit.    Allergies  Allergen Reactions   Demeclocycline Hives   Erythromycin Nausea And Vomiting and Other (See Comments)    Severe irritable bowel   Flagyl [Metronidazole] Nausea And Vomiting and Other (See Comments)    Severe irritable bowel   Glucophage [Metformin Hcl] Nausea And Vomiting and Other (See Comments)    "Sick" "I won't take anything that has  metformin in it"   Tetracyclines & Related Hives and Rash   Potassium Chloride  Other (See Comments)    Cannot swallow potssium  pills, able to tolerate liquid potassium   Tetracycline Other (See Comments)   Diovan [Valsartan] Nausea Only        Sulfa  Antibiotics Other (See Comments) and Rash    As child   Xanax [Alprazolam] Other (See Comments)    Hyperactivity       Social History   Socioeconomic History   Marital status: Married    Spouse name: Baker Bon    Number of children: 2   Years of education: College    Highest education level: Not on file  Occupational History    Employer: retired  Tobacco Use   Smoking status: Never    Passive exposure: Never   Smokeless tobacco: Never  Vaping Use   Vaping status: Never Used  Substance and Sexual Activity   Alcohol  use: No   Drug use: Not Currently    Comment: prescribed valium    Sexual activity: Not Currently    Birth control/protection: Post-menopausal, Surgical  Other Topics Concern   Not on file  Social History Narrative   Patient lives at home with husband Baker Bon.    Right Handed   Drinks caffeinated tea occasionally   One pet in house, dog   Social Drivers of Health   Financial Resource Strain: Low Risk  (09/15/2021)   Overall Financial Resource Strain (CARDIA)    Difficulty of Paying Living Expenses: Not hard at all  Food Insecurity: No Food Insecurity (04/05/2023)   Hunger Vital Sign    Worried About Running Out of Food in the Last Year: Never true    Ran Out of Food in the Last Year: Never true  Transportation Needs: No Transportation Needs (04/05/2023)   PRAPARE - Administrator, Civil Service (Medical): No    Lack of Transportation (Non-Medical): No  Physical Activity: Insufficiently Active (11/15/2020)   Exercise Vital Sign    Days of Exercise per Week: 2 days    Minutes of Exercise per Session: 40 min  Stress: Stress Concern Present (08/03/2020)   [employer] of Occupational Health -  Occupational Stress Questionnaire    Feeling of Stress : Rather much  Social Connections: Not on file  Intimate Partner Violence: Not At Risk (03/28/2023)   Humiliation, Afraid, Rape, and Kick questionnaire    Fear of Current or Ex-Partner: No    Emotionally Abused: No    Physically Abused: No    Sexually Abused: No      Family History  Problem Relation Age of Onset   Heart disease Father    Hypertension Father    Prostate cancer Father    Stroke Father    Osteoporosis Father    Stroke Mother    Depression Mother    Headache Mother    Heart disease Mother    Thyroid  disease Mother    Hypertension Mother    Diabetes Daughter    Heart disease Daughter    Hypertension Daughter    Hypertension Son    Vitals:   11/06/23 1530  BP: (!) 123/59  Pulse: 84  SpO2: 95%  Weight: 241 lb (109.3 kg)   Wt Readings from Last 3 Encounters:  11/06/23 241 lb (109.3 kg)  10/23/23 243 lb (110.2 kg)  10/21/23 242 lb (109.8 kg)   Lab Results  Component Value Date   CREATININE 1.42 (H) 10/21/2023   CREATININE 1.53 (H) 07/26/2023   CREATININE 1.72 (H) 07/01/2023    PHYSICAL EXAM:  General: Tired appearing. No resp difficulty HEENT: normal Neck: supple, no JVD Cor:  Regular rhythm, rate. No rubs, gallops or murmurs Lungs: clear Abdomen: soft, nontender, nondistended. Extremities: no cyanosis, clubbing, rash, 1+ pitting edema right lower leg, 2+ pitting edema left lower leg; couple small knots in left calf Neuro: alert & oriented X 3. Moves all 4 extremities w/o difficulty. Affect pleasant   ECG: Not done   ASSESSMENT & PLAN:  1: NICM with preserved ejection fraction- - suspect due to obesity, atrial fibrillation, vascular disease, hypertension, CKD - NYHA class III: symptoms 2/2 deconditioning. - minimally fluid up although slightly improving - weight down 2 pounds from last visit here 2 weeks ago - Echo 08/21/17: EF 65-70% with Grade II DD and mildly elevated PA pressure of 33  mmHg - Echo 12/30/19: EF 65-70% with Grade II DD - Echo 03/29/23: EF 60-65% with mild LVH, Grade II DD - continue farxiga  10mg  daily - continue finerenone  10mg  daily - begin spironolactone  12.5mg  daily; BMET today - decrease torsemide  back to 60mg  BID (it had previously been increased & she was unable to tolerate it) - continue potassium 40meq BID; may be able to decrease this w/ addition of spironolactone  but will get lab results back first - ? Whether cardiomems would be beneficial although edema may be more vascular in nature - BNP 07/01/23 was 40.6; BNP today  2: HTN- - BP 123/59 - saw PCP Madelon Scheuermann) 03/25 - BMET 10/21/23 reviewed: sodium 137, potassium 3.8, creatinine 1.42 & GFR 38 - BMET today  3: PAF- - saw cardiology Gennaro Khat) 04/25 - continue apixaban  5mg  BID - regular rate/ rhythm today  4: OSA- - had video visit with pulmonology Cleo Dace) 10/24 - wearing CPAP nightly  5: Left leg edema- - with more swelling in left leg and painful knots felt in left calf will order ultrasound to rule out blood clot - on apixaban  so clot is unlikely  6: Iron  deficiency anemia- - saw hematology Valentine Gasmen) 09/24 - hemoglobin 04/08/23 was 14.2  7: DM- - saw endocrinology Verlyn Goad) 03/25 - A1c 09/30/23 was 9.3% - patient says that glucose is continuing to run high  8. CKD IIIB - With underlying T2DM, HFpEF will continue finerenone  10mg .  - saw nephrology (Korrapati) 03/25  9. Deconditioning - she did not qualify for pulmonary rehab due to multiple orthopaedic issues along with severe incontinence - not doing any PT yet   Return in 2 weeks, sooner if needed.   Shawnee Dellen, [place] 11/06/23

## 2023-11-06 ENCOUNTER — Encounter: Payer: Self-pay | Admitting: Family

## 2023-11-06 ENCOUNTER — Ambulatory Visit: Attending: Family | Admitting: Family

## 2023-11-06 VITALS — BP 123/59 | HR 84 | Wt 241.0 lb

## 2023-11-06 DIAGNOSIS — N183 Chronic kidney disease, stage 3 unspecified: Secondary | ICD-10-CM

## 2023-11-06 DIAGNOSIS — I5032 Chronic diastolic (congestive) heart failure: Secondary | ICD-10-CM | POA: Diagnosis not present

## 2023-11-06 DIAGNOSIS — R5381 Other malaise: Secondary | ICD-10-CM | POA: Insufficient documentation

## 2023-11-06 DIAGNOSIS — I4819 Other persistent atrial fibrillation: Secondary | ICD-10-CM | POA: Diagnosis not present

## 2023-11-06 DIAGNOSIS — D631 Anemia in chronic kidney disease: Secondary | ICD-10-CM | POA: Diagnosis not present

## 2023-11-06 DIAGNOSIS — Z955 Presence of coronary angioplasty implant and graft: Secondary | ICD-10-CM | POA: Insufficient documentation

## 2023-11-06 DIAGNOSIS — E1122 Type 2 diabetes mellitus with diabetic chronic kidney disease: Secondary | ICD-10-CM | POA: Insufficient documentation

## 2023-11-06 DIAGNOSIS — I480 Paroxysmal atrial fibrillation: Secondary | ICD-10-CM | POA: Diagnosis not present

## 2023-11-06 DIAGNOSIS — G4733 Obstructive sleep apnea (adult) (pediatric): Secondary | ICD-10-CM | POA: Insufficient documentation

## 2023-11-06 DIAGNOSIS — N1832 Chronic kidney disease, stage 3b: Secondary | ICD-10-CM | POA: Insufficient documentation

## 2023-11-06 DIAGNOSIS — D509 Iron deficiency anemia, unspecified: Secondary | ICD-10-CM | POA: Insufficient documentation

## 2023-11-06 DIAGNOSIS — I2510 Atherosclerotic heart disease of native coronary artery without angina pectoris: Secondary | ICD-10-CM | POA: Diagnosis not present

## 2023-11-06 DIAGNOSIS — I10 Essential (primary) hypertension: Secondary | ICD-10-CM | POA: Diagnosis not present

## 2023-11-06 DIAGNOSIS — I130 Hypertensive heart and chronic kidney disease with heart failure and stage 1 through stage 4 chronic kidney disease, or unspecified chronic kidney disease: Secondary | ICD-10-CM | POA: Insufficient documentation

## 2023-11-06 DIAGNOSIS — R0602 Shortness of breath: Secondary | ICD-10-CM | POA: Diagnosis present

## 2023-11-06 DIAGNOSIS — R600 Localized edema: Secondary | ICD-10-CM | POA: Insufficient documentation

## 2023-11-06 DIAGNOSIS — E1142 Type 2 diabetes mellitus with diabetic polyneuropathy: Secondary | ICD-10-CM | POA: Diagnosis not present

## 2023-11-06 LAB — BASIC METABOLIC PANEL WITH GFR
Anion gap: 16 — ABNORMAL HIGH (ref 5–15)
BUN: 45 mg/dL — ABNORMAL HIGH (ref 8–23)
CO2: 28 mmol/L (ref 22–32)
Calcium: 8.6 mg/dL — ABNORMAL LOW (ref 8.9–10.3)
Chloride: 91 mmol/L — ABNORMAL LOW (ref 98–111)
Creatinine, Ser: 1.69 mg/dL — ABNORMAL HIGH (ref 0.44–1.00)
GFR, Estimated: 31 mL/min — ABNORMAL LOW
Glucose, Bld: 228 mg/dL — ABNORMAL HIGH (ref 70–99)
Potassium: 3.6 mmol/L (ref 3.5–5.1)
Sodium: 135 mmol/L (ref 135–145)

## 2023-11-06 LAB — BRAIN NATRIURETIC PEPTIDE: B Natriuretic Peptide: 62.6 pg/mL (ref 0.0–100.0)

## 2023-11-06 MED ORDER — SPIRONOLACTONE 25 MG PO TABS: 12.5000 mg | ORAL_TABLET | Freq: Every day | ORAL | 3 refills | Status: DC

## 2023-11-06 MED ORDER — TORSEMIDE 20 MG PO TABS: 60.0000 mg | ORAL_TABLET | Freq: Two times a day (BID) | ORAL | 5 refills | Status: DC

## 2023-11-06 NOTE — Patient Instructions (Signed)
 Medication Changes:  START SPIRONOLACTONE  12.5 MG ONCE DAILY  CHANGE TORSEMIDE  TO 60 MG TWICE DAILY  Lab Work:  Go over to the [facility]. Go pass the gift shop and have your blood work completed.  We will only call you if the results are abnormal or if the provider would like to make medication changes.   Testing/Procedures:  We have placed an order for an ultrasound of your left leg. [facility] will be calling you soon to get this scheduled. If you would like to get this scheduled sooner, you can go over to their office next door and for scheduling.   Follow-Up: with Dr. Bruce Caper.  At the [facility], you and your health needs are our priority. We have a designated team specialized in the treatment of Heart Failure. This Care Team includes your primary Heart Failure Specialized Cardiologist (physician), Advanced Practice Providers (APPs- Physician Assistants and Nurse Practitioners), and Pharmacist who all work together to provide you with the care you need, when you need it.   You may see any of the following providers on your designated Care Team at your next follow up:  Dr. Jules Oar Dr. Peder Bourdon Dr. Alwin Baars Dr. Judyth Nunnery Shawnee Dellen, FNP Bevely Brush, RPH-CPP  Please be sure to bring in all your medications bottles to every appointment.   Need to Contact Us :  If you have any questions or concerns before your next appointment please send us  a message through [place] or call our office at [phone].    TO LEAVE A MESSAGE FOR THE NURSE SELECT OPTION 2, PLEASE LEAVE A MESSAGE INCLUDING: YOUR NAME DATE OF BIRTH CALL BACK NUMBER REASON FOR CALL**this is important as we prioritize the call backs  YOU WILL RECEIVE A CALL BACK THE SAME DAY AS LONG AS YOU CALL BEFORE 4:00 PM

## 2023-11-07 ENCOUNTER — Encounter: Payer: Self-pay | Admitting: Family

## 2023-11-07 ENCOUNTER — Other Ambulatory Visit: Payer: Self-pay | Admitting: Family

## 2023-11-07 DIAGNOSIS — I5032 Chronic diastolic (congestive) heart failure: Secondary | ICD-10-CM

## 2023-11-15 ENCOUNTER — Encounter (INDEPENDENT_AMBULATORY_CARE_PROVIDER_SITE_OTHER): Payer: Self-pay | Admitting: Vascular Surgery

## 2023-11-18 ENCOUNTER — Ambulatory Visit: Admitting: Podiatry

## 2023-11-18 ENCOUNTER — Other Ambulatory Visit: Payer: Self-pay | Admitting: Family

## 2023-11-18 NOTE — Addendum Note (Signed)
 Addended by: Psalm Arman K on: 11/18/2023 08:34 AM   Modules accepted: Orders

## 2023-11-19 ENCOUNTER — Ambulatory Visit (INDEPENDENT_AMBULATORY_CARE_PROVIDER_SITE_OTHER)

## 2023-11-19 ENCOUNTER — Encounter (HOSPITAL_COMMUNITY): Payer: Self-pay

## 2023-11-19 DIAGNOSIS — I5032 Chronic diastolic (congestive) heart failure: Secondary | ICD-10-CM

## 2023-11-19 DIAGNOSIS — R600 Localized edema: Secondary | ICD-10-CM | POA: Diagnosis not present

## 2023-11-20 ENCOUNTER — Telehealth (INDEPENDENT_AMBULATORY_CARE_PROVIDER_SITE_OTHER): Payer: Self-pay

## 2023-11-20 ENCOUNTER — Encounter: Admitting: Cardiology

## 2023-11-20 ENCOUNTER — Telehealth (HOSPITAL_COMMUNITY): Payer: Self-pay | Admitting: *Deleted

## 2023-11-20 NOTE — Telephone Encounter (Signed)
Attempted to call patient regarding upcoming cardiac PET appointment. Left message on voicemail with name and callback number  Larey Brick RN Navigator Cardiac Imaging [facility] [phone] Office [phone] Cell  Reminder to avoid caffeine 12 hours prior to her cardiac PET study.

## 2023-11-20 NOTE — Telephone Encounter (Signed)
Tried to call pt back with no answer.

## 2023-11-20 NOTE — Telephone Encounter (Signed)
 Tried calling with with no answer

## 2023-11-20 NOTE — Telephone Encounter (Signed)
 Because the patient was always seen for a lab only and she is not actually our patient, I would defer whether or not compression sock usage is appropriate to her PCP.

## 2023-11-21 ENCOUNTER — Ambulatory Visit
Admission: RE | Admit: 2023-11-21 | Discharge: 2023-11-21 | Disposition: A | Discharge status: Home / Self Care | Source: Ambulatory Visit | Attending: Medical | Admitting: Medical

## 2023-11-21 DIAGNOSIS — Z9884 Bariatric surgery status: Secondary | ICD-10-CM | POA: Insufficient documentation

## 2023-11-21 DIAGNOSIS — R0789 Other chest pain: Secondary | ICD-10-CM | POA: Insufficient documentation

## 2023-11-21 DIAGNOSIS — I25758 Atherosclerosis of native coronary artery of transplanted heart with other forms of angina pectoris: Secondary | ICD-10-CM | POA: Insufficient documentation

## 2023-11-21 DIAGNOSIS — D3502 Benign neoplasm of left adrenal gland: Secondary | ICD-10-CM | POA: Diagnosis not present

## 2023-11-21 LAB — NM PET CT CARDIAC PERFUSION MULTI W/ABSOLUTE BLOODFLOW
LV dias vol: 59 mL (ref 46–106)
LV sys vol: 14 mL
MBFR: 2.3
Nuc Rest EF: 76 %
Nuc Stress EF: 77 %
Rest MBF: 0.94 ml/g/min
Rest Nuclear Isotope Dose: 25 mCi
ST Depression (mm): 0 mm
Stress MBF: 2.16 ml/g/min
Stress Nuclear Isotope Dose: 25 mCi

## 2023-11-21 MED ORDER — RUBIDIUM RB82 GENERATOR (RUBYFILL)
25.0000 | PACK | Freq: Once | INTRAVENOUS | Status: AC
  Administered 2023-11-21: 24.96 via INTRAVENOUS

## 2023-11-21 MED ORDER — REGADENOSON 0.4 MG/5ML IV SOLN
INTRAVENOUS | Status: AC
  Filled 2023-11-21: qty 5

## 2023-11-21 MED ORDER — REGADENOSON 0.4 MG/5ML IV SOLN
0.4000 mg | Freq: Once | INTRAVENOUS | Status: AC
  Administered 2023-11-21: 0.4 mg via INTRAVENOUS
  Filled 2023-11-21: qty 5

## 2023-11-21 MED ORDER — RUBIDIUM RB82 GENERATOR (RUBYFILL)
25.0000 | PACK | Freq: Once | INTRAVENOUS | Status: AC
  Administered 2023-11-21: 24.97 via INTRAVENOUS

## 2023-11-21 NOTE — Telephone Encounter (Signed)
Tried calling pt with no answer. 

## 2023-11-21 NOTE — Progress Notes (Signed)
 Patient presents for a cardiac PET stress test and tolerated procedure without incident. Patient maintained acceptable vital signs throughout the test and was offered caffeine after test.  Patient escorted out of department in a wheelchair.

## 2023-11-21 NOTE — Progress Notes (Signed)
 Spoke with Dr. Alda Amas regarding patient's BP.  Dr. Alda Amas states it's ok to proceed with stress test.

## 2023-11-22 ENCOUNTER — Ambulatory Visit: Payer: Medicare Other | Admitting: Internal Medicine

## 2023-11-22 ENCOUNTER — Ambulatory Visit: Payer: Medicare Other

## 2023-11-22 ENCOUNTER — Other Ambulatory Visit: Payer: Medicare Other

## 2023-11-25 ENCOUNTER — Telehealth: Payer: Self-pay | Admitting: Family

## 2023-11-25 NOTE — Telephone Encounter (Signed)
 Called to confirm/remind patient of their appointment at the [facility] on 11/26/23.   Appointment:   [x] Confirmed  [] Left mess   [] No answer/No voice mail  [] VM Full/unable to leave message  [] Phone not in service  Patient reminded to bring all medications and/or complete list.  Confirmed patient has transportation. Gave directions, instructed to utilize valet parking.

## 2023-11-26 ENCOUNTER — Ambulatory Visit: Attending: Cardiology | Admitting: Cardiology

## 2023-11-26 ENCOUNTER — Encounter: Payer: Self-pay | Admitting: Cardiology

## 2023-11-26 VITALS — BP 124/60 | HR 78 | Wt 244.8 lb

## 2023-11-26 DIAGNOSIS — D631 Anemia in chronic kidney disease: Secondary | ICD-10-CM | POA: Diagnosis not present

## 2023-11-26 DIAGNOSIS — M79672 Pain in left foot: Secondary | ICD-10-CM | POA: Diagnosis not present

## 2023-11-26 DIAGNOSIS — N183 Chronic kidney disease, stage 3 unspecified: Secondary | ICD-10-CM | POA: Diagnosis not present

## 2023-11-26 DIAGNOSIS — I480 Paroxysmal atrial fibrillation: Secondary | ICD-10-CM | POA: Insufficient documentation

## 2023-11-26 DIAGNOSIS — R7989 Other specified abnormal findings of blood chemistry: Secondary | ICD-10-CM | POA: Insufficient documentation

## 2023-11-26 DIAGNOSIS — I10 Essential (primary) hypertension: Secondary | ICD-10-CM

## 2023-11-26 DIAGNOSIS — N1832 Chronic kidney disease, stage 3b: Secondary | ICD-10-CM | POA: Diagnosis not present

## 2023-11-26 DIAGNOSIS — Z955 Presence of coronary angioplasty implant and graft: Secondary | ICD-10-CM | POA: Insufficient documentation

## 2023-11-26 DIAGNOSIS — Z7984 Long term (current) use of oral hypoglycemic drugs: Secondary | ICD-10-CM | POA: Insufficient documentation

## 2023-11-26 DIAGNOSIS — E1122 Type 2 diabetes mellitus with diabetic chronic kidney disease: Secondary | ICD-10-CM | POA: Insufficient documentation

## 2023-11-26 DIAGNOSIS — I2510 Atherosclerotic heart disease of native coronary artery without angina pectoris: Secondary | ICD-10-CM | POA: Diagnosis not present

## 2023-11-26 DIAGNOSIS — I4819 Other persistent atrial fibrillation: Secondary | ICD-10-CM | POA: Diagnosis not present

## 2023-11-26 DIAGNOSIS — G4733 Obstructive sleep apnea (adult) (pediatric): Secondary | ICD-10-CM | POA: Insufficient documentation

## 2023-11-26 DIAGNOSIS — I5032 Chronic diastolic (congestive) heart failure: Secondary | ICD-10-CM | POA: Insufficient documentation

## 2023-11-26 DIAGNOSIS — Z79899 Other long term (current) drug therapy: Secondary | ICD-10-CM | POA: Diagnosis not present

## 2023-11-26 DIAGNOSIS — M109 Gout, unspecified: Secondary | ICD-10-CM

## 2023-11-26 DIAGNOSIS — Z7901 Long term (current) use of anticoagulants: Secondary | ICD-10-CM | POA: Diagnosis not present

## 2023-11-26 DIAGNOSIS — R600 Localized edema: Secondary | ICD-10-CM | POA: Insufficient documentation

## 2023-11-26 DIAGNOSIS — I428 Other cardiomyopathies: Secondary | ICD-10-CM | POA: Insufficient documentation

## 2023-11-26 DIAGNOSIS — I130 Hypertensive heart and chronic kidney disease with heart failure and stage 1 through stage 4 chronic kidney disease, or unspecified chronic kidney disease: Secondary | ICD-10-CM | POA: Insufficient documentation

## 2023-11-26 MED ORDER — TORSEMIDE 20 MG PO TABS: 60.0000 mg | ORAL_TABLET | Freq: Every day | ORAL | 11 refills | Status: DC

## 2023-11-26 MED ORDER — COLCHICINE 0.6 MG PO TABS: 0.6000 mg | ORAL_TABLET | Freq: Every day | ORAL | 11 refills | Status: DC

## 2023-11-26 NOTE — Patient Instructions (Addendum)
 Medication Changes:  STOP SPIRONOLACTONE   DECREASE TORSEMIDE  TO 60 MG ONCE DAILY  START COLCHICINE 0.6 MG DAILY   Follow-Up in: 2 MONTHS WITH DR. Bruce Caper  Our Doctors' schedules are NOT open yet for 2 months. We will place you on our recall list. Once they are available, we will call you to schedule your follow up appointment.   At the [facility], you and your health needs are our priority. We have a designated team specialized in the treatment of Heart Failure. This Care Team includes your primary Heart Failure Specialized Cardiologist (physician), Advanced Practice Providers (APPs- Physician Assistants and Nurse Practitioners), and Pharmacist who all work together to provide you with the care you need, when you need it.   You may see any of the following providers on your designated Care Team at your next follow up:  Dr. Jules Oar Dr. Peder Bourdon Dr. Alwin Baars Dr. Judyth Nunnery Shawnee Dellen, FNP Bevely Brush, RPH-CPP  Please be sure to bring in all your medications bottles to every appointment.   Need to Contact Us :  If you have any questions or concerns before your next appointment please send us  a message through [facility] or call our office at [phone].    TO LEAVE A MESSAGE FOR THE NURSE SELECT OPTION 2, PLEASE LEAVE A MESSAGE INCLUDING: YOUR NAME DATE OF BIRTH CALL BACK NUMBER REASON FOR CALL**this is important as we prioritize the call backs  YOU WILL RECEIVE A CALL BACK THE SAME DAY AS LONG AS YOU CALL BEFORE 4:00 PM

## 2023-11-27 ENCOUNTER — Encounter: Payer: Self-pay | Admitting: Podiatry

## 2023-11-27 ENCOUNTER — Ambulatory Visit (INDEPENDENT_AMBULATORY_CARE_PROVIDER_SITE_OTHER): Admitting: Podiatry

## 2023-11-27 ENCOUNTER — Ambulatory Visit (INDEPENDENT_AMBULATORY_CARE_PROVIDER_SITE_OTHER)

## 2023-11-27 DIAGNOSIS — R600 Localized edema: Secondary | ICD-10-CM

## 2023-11-27 NOTE — Progress Notes (Signed)
 Subjective:  Patient ID: Denise Sharp, female    DOB: June 29, 1946,  MRN: [id number] HPI Chief Complaint  Patient presents with   Foot Pain    "I've been in pain with this foot for three months.  It swells up to my knee." N - foot pain L - dorsal, plantar, medial, and lateral left D - 3 mos O - gradually worse C - throbs all the time, sharp pains, swells, skin sloughing off A - putting weight on it T - several doctors, Tizanidine , 10 day Prednisone , PET Scan,  Arterial Doppler, nobody will give anything for pain    78 y.o. female presents with the above complaint.   Denies fever chills nausea vomit muscle aches pains calf pain back pain chest pain shortness of breath.  States that her leg and her foot have been killing her lately.  No one really knows what to do one of her providers gave her colchicine she just picked it up today has not taken the first pill yet.  She knows she has chronic kidney disease and she is taking a lot of diuretics at this point.  She had a venous ultrasound done of the calf which was negative.  Past Medical History:  Diagnosis Date   (HFpEF) heart failure with preserved ejection fraction (HCC)    a. 12/2019 Echo: EF 65-70%, Gr2 DD. No significant valvular dzs.   Abnormality of gait 03/25/2013   Adrenal mass, left (HCC)    Anginal pain (HCC)    Anxiety    Aortic atherosclerosis (HCC)    Arthritis    Asthma    CAD (coronary artery disease) with h/o Atypical Chest Pain    a. 04/1986 Cath ([facility]): nl cors, EF 65%; b. 07/2012 Cath [facility]): Diff minor irregs; c. 07/2016 MV [facility]): "Equivocal"; d. 08/2016 Cardiac CT Ca2+ score [facility]): Ca2+ 1548; e. 05/2019 MV: No ischemia. EF 75%; f. 12/2019 PCI: LM nl, LAD 65ost (3.5x12 Resolute DES), 33m (2.75x12 Resolute DES),LCX/RCA nl; g. 08/2020 Cath: patent LAD stents, RCA 40ost, elev RH pressures, EF >65%.   Cervical spinal stenosis 1994   due to trauma to back ([employer] accident), has intermittent paralysis and parasthesias    Cervicogenic headache 03/23/2014   CKD (chronic kidney disease), stage III (HCC)    Depression    Diverticulosis    Dizziness    a.) chronic   DJD (degenerative joint disease)    a. Chronic R shoulder pain   Dyspnea    Esophageal stenosis 09/2009   a.) transient outlet obstruction by food, cleared by EGD   Family history of adverse reaction to anesthesia    a.) daughter with (+) PONV   Gastric bypass status for obesity    GERD (gastroesophageal reflux disease)    Headache(784.0)    HLD (hyperlipidemia)    Hypertension    IBS (irritable bowel syndrome)    IDA (iron  deficiency anemia)    a.) post 2 unit txfsn 2009, normal endo/colonoscopy by [facility]   Left bundle branch block (LBBB)    a.) Intermittently present - likely rate related.   NSVT (nonsustained ventricular tachycardia) (HCC) 11/15/2020   a.) Holter study 11/15/2020; 11 episodes of NSVT lasting up to 8 beats with a maximum rate of 210 bpm; 14 atrial runs lasting up to 18 beats with a rate of up to 250 bpm.  Some atrial runs felt to be NSVT.   NSVT (nonsustained ventricular tachycardia) (HCC)    a. 10/2020 [facility]: 11 runs of NSVT up to  8 beats.   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 10/2020 [facility]: 14 atrial runs up to 18 beats, Eldean Klatt HR 250.   Restless leg syndrome    Rotator cuff arthropathy, right 08/13/2017   Syncope and collapse 03/12/2014   Type II diabetes mellitus (HCC)    Past Surgical History:  Procedure Laterality Date   APPENDECTOMY     BACK SURGERY     CARDIOVERSION N/A 10/30/2022   Procedure: CARDIOVERSION;  Surgeon: Sammy Crisp, MD;  Location: [facility];  Service: Cardiovascular;  Laterality: N/A;   CHOLECYSTECTOMY N/A 1976   COLONOSCOPY     CORONARY ANGIOPLASTY     CORONARY STENT INTERVENTION N/A 01/04/2020   Procedure: CORONARY STENT INTERVENTION;   Surgeon: Wenona Hamilton, MD;  Location: [facility];  Service: Cardiovascular;  Laterality: N/A;  LAD    DIAPHRAGMATIC HERNIA REPAIR  2015   ESOPHAGEAL DILATION     multiple   ESOPHAGOGASTRODUODENOSCOPY (EGD) WITH PROPOFOL  N/A 05/11/2021   Procedure: ESOPHAGOGASTRODUODENOSCOPY (EGD) WITH PROPOFOL ;  Surgeon: Marnee Sink, MD;  Location: [facility];  Service: Endoscopy;  Laterality: N/A;   ESOPHAGOGASTRODUODENOSCOPY (EGD) WITH PROPOFOL  N/A 06/13/2021   Procedure: ESOPHAGOGASTRODUODENOSCOPY (EGD) WITH PROPOFOL ;  Surgeon: Marnee Sink, MD;  Location: [facility];  Service: Endoscopy;  Laterality: N/A;   ESOPHAGOGASTRODUODENOSCOPY (EGD) WITH PROPOFOL  N/A 02/28/2023   Procedure: ESOPHAGOGASTRODUODENOSCOPY (EGD) WITH PROPOFOL ;  Surgeon: Marnee Sink, MD;  Location: [facility];  Service: Endoscopy;  Laterality: N/A;   EYE SURGERY     GASTRIC BYPASS  2000, 2005   Dr. Joanne Muckle IMPLANT PLACEMENT  10/2011   Saratha Cunas IMPLANT PLACEMENT N/A 09/18/2021   Procedure: Simona Dublin IMPLANT FIRST STAGE;  Surgeon: Erman Hayward, MD;  Location: [facility];  Service: Urology;  Laterality: N/A;   INTERSTIM IMPLANT PLACEMENT N/A 09/18/2021   Procedure: Simona [place] IMPLANT SECOND STAGE WITH IMPEDENCE CHECK;  Surgeon: Erman Hayward, MD;  Location: [facility];  Service: Urology;  Laterality: N/A;   INTERSTIM IMPLANT REMOVAL N/A 09/18/2021   Procedure: REMOVAL OF INTERSTIM IMPLANT;  Surgeon: Erman Hayward, MD;  Location: [facility];  Service: Urology;  Laterality: N/A;   LEFT HEART CATH AND CORONARY ANGIOGRAPHY N/A 04/1986   LEFT HEART CATH AND CORONARY ANGIOGRAPHY Left 07/24/2012   LEFT HEART CATH AND CORS/GRAFTS ANGIOGRAPHY N/A 12/31/2019   Procedure: LEFT HEART CATH AND CORS/GRAFTS ANGIOGRAPHY;  Surgeon: Devorah Fonder, MD;  Location: [facility];  Service: Cardiovascular;  Laterality: N/A;   PANNICULECTOMY N/A 06/16/2019   Procedure: PANNICULECTOMY;  Surgeon:  Barb Bonito, MD;  Location: [facility];  Service: Plastics;  Laterality: N/A;  3 hours, please   REVERSE SHOULDER ARTHROPLASTY Right 08/13/2017   Procedure: REVERSE RIGHT SHOULDER ARTHROPLASTY;  Surgeon: Osa Blase, MD;  Location: [facility];  Service: Orthopedics;  Laterality: Right;   RIGHT/LEFT HEART CATH AND CORONARY ANGIOGRAPHY N/A 09/06/2020   Procedure: RIGHT/LEFT HEART CATH AND CORONARY ANGIOGRAPHY;  Surgeon: Sammy Crisp, MD;  Location: [facility];  Service: Cardiovascular;  Laterality: N/A;   RIGHT/LEFT HEART CATH AND CORONARY ANGIOGRAPHY N/A 06/04/2022   Procedure: RIGHT/LEFT HEART CATH AND CORONARY ANGIOGRAPHY;  Surgeon: Sammy Crisp, MD;  Location: [facility];  Service: Cardiovascular;  Laterality: N/A;   ROTATOR CUFF REPAIR Right    SPINE SURGERY  1995   [place]  TOOTH EXTRACTION  11/13/2021   TOTAL ABDOMINAL HYSTERECTOMY W/ BILATERAL SALPINGOOPHORECTOMY  1974   TOTAL KNEE ARTHROPLASTY Bilateral 2007   Procedure: TOTAL KNEE ARTHROPLASTY; Surgeons: Glinda Lapping, MD and [name], MD   UMBILICAL HERNIA REPAIR  08/11/2015    Current Outpatient Medications:    apixaban  (ELIQUIS ) 5 MG TABS tablet, Take 1 tablet (5 mg total) by mouth 2 (two) times daily., Disp: 180 tablet, Rfl: 1   atorvastatin  (LIPITOR ) 80 MG tablet, Take 1 tablet (80 mg total) by mouth daily., Disp: 90 tablet, Rfl: 1   budesonide -formoterol  (SYMBICORT ) 160-4.5 MCG/ACT inhaler, Inhale 2 puffs into the lungs 2 (two) times daily., Disp: 1 each, Rfl: 6   butalbital -acetaminophen -caffeine  (FIORICET ) 50-325-40 MG tablet, TAKE 1 TABLET BY MOUTH EVERY 6 HOURS AS NEEDED FOR HEADACHE., Disp: 60 tablet, Rfl: 2   Cholecalciferol  (VITAMIN D3) 250 MCG (10000 UT) capsule, Take 10,000 Units by mouth daily., Disp: , Rfl:    clotrimazole -betamethasone  (LOTRISONE ) cream, Apply 1 application topically 2 (two) times daily as needed (irritation)., Disp: , Rfl:    colchicine 0.6 MG tablet, Take 1 tablet (0.6 mg total) by  mouth daily., Disp: 30 tablet, Rfl: 11   conjugated estrogens  (PREMARIN ) vaginal cream, Apply one pea-sized amount around the opening of the urethra daily for 2 weeks, then 3 times weekly moving forward., Disp: 30 g, Rfl: 4   Continuous Blood Gluc Receiver (FREESTYLE LIBRE 2 READER) DEVI, Use to check sugar at least TID, Disp: 1 each, Rfl: 1   Continuous Glucose Sensor (FREESTYLE LIBRE 3 PLUS SENSOR) MISC, Change sensor every 15 days., Disp: 6 each, Rfl: 3   cyanocobalamin  (VITAMIN B12) 1000 MCG/ML injection, Inject 1 mL (1,000 mcg total) into the muscle every 30 (thirty) days. Inject 1 ml (1000 mcg ) IM weekly x 4,  Then monthly thereafter, Disp: 3 mL, Rfl: 1   dapagliflozin  propanediol (FARXIGA ) 10 MG TABS tablet, Take 1 tablet (10 mg total) by mouth daily., Disp: 90 tablet, Rfl: 3   dicyclomine  (BENTYL ) 20 MG tablet, Take 1 tablet (20 mg total) by mouth 3 (three) times daily before meals., Disp: 180 tablet, Rfl: 2   DULoxetine  (CYMBALTA ) 60 MG capsule, Take 1 capsule (60 mg total) by mouth daily., Disp: 90 capsule, Rfl: 3   ezetimibe  (ZETIA ) 10 MG tablet, Take 1 tablet (10 mg total) by mouth daily., Disp: 90 tablet, Rfl: 3   fexofenadine (ALLEGRA) 180 MG tablet, Take 180 mg by mouth as needed for allergies or rhinitis., Disp: , Rfl:    Finerenone  (KERENDIA ) 10 MG TABS, Take 1 tablet (10 mg total) by mouth in the morning., Disp: 90 tablet, Rfl: 3   fluticasone  (FLONASE ) 50 MCG/ACT nasal spray, Place 2 sprays into both nostrils daily., Disp: 16 g, Rfl: 6   Glucagon  (GVOKE HYPOPEN  2-PACK) 0.5 MG/0.1ML SOAJ, Inject 1 auto injector subcutaneously as needed for severe hypoglycemia. May repeat x 1 after 15 minutes if needed., Disp: 0.1 mL, Rfl: 2   glucose blood (ONETOUCH VERIO) test strip, Use to check blood sugar twice daily., Disp: 100 each, Rfl: 1   hydrocortisone  (ANUSOL -HC) 25 MG suppository, Place 1 suppository (25 mg total) rectally 2 (two) times daily., Disp: 24 suppository, Rfl: 5   insulin   aspart (NOVOLOG  FLEXPEN) 100 UNIT/ML FlexPen, Inject 10-16 Units into the skin 3 (three) times daily with meals., Disp: 36 mL, Rfl: 3   insulin  degludec (TRESIBA  FLEXTOUCH) 100 UNIT/ML FlexTouch Pen, Inject 30 Units into the skin daily., Disp: 30 mL, Rfl: 0   Insulin   Pen Needle (DROPLET PEN NEEDLES) 31G X 5 MM MISC, USE ONE NEEDLE SUBCUTANEOUSLY AS DIRECTED (REMOVE AND DISCARD NEEDLE IN SHARPS CONTAINER IMMEDIATELY AFTER USE), Disp: 100 each, Rfl: 1   lipase/protease/amylase (CREON ) 36000 UNITS CPEP capsule, Take 2 capsules (72,000 Units total) by mouth 3 (three) times daily before meals AND 1 capsule (36,000 Units total) with snacks., Disp: 300 capsule, Rfl: 9   loperamide  (IMODIUM ) 2 MG capsule, Take 4-6 mg by mouth 3 (three) times daily before meals., Disp: , Rfl:    nitroGLYCERIN  (NITROSTAT ) 0.4 MG SL tablet, Place 1 tablet (0.4 mg total) under the tongue every 5 (five) minutes as needed for chest pain., Disp: 25 tablet, Rfl: 5   nystatin  (MYCOSTATIN /NYSTOP ) powder, Apply 1 g topically 4 (four) times daily as needed (rash)., Disp: , Rfl:    ondansetron  (ZOFRAN -ODT) 4 MG disintegrating tablet, Take 1 tablet (4 mg total) by mouth every 8 (eight) hours as needed for nausea or vomiting., Disp: 30 tablet, Rfl: 0   OneTouch Delica Lancets 33G MISC, Used to check blood sugar two times a day., Disp: 100 each, Rfl: 4   OVER THE COUNTER MEDICATION, Apply 1 application  topically daily as needed (pain). [place] Gel topical pain reliever, Disp: , Rfl:    pantoprazole  (PROTONIX ) 40 MG tablet, Take 1 tablet (40 mg total) by mouth daily., Disp: 90 tablet, Rfl: 3   Polyethyl Glycol-Propyl Glycol (SYSTANE OP), Place 1 drop into both eyes 4 (four) times daily as needed (dry eyes)., Disp: , Rfl:    potassium chloride  20 MEQ/15ML (10%) SOLN, Take 15 mLs (20 mEq total) by mouth daily., Disp: 473 mL, Rfl: 3   pregabalin  (LYRICA ) 50 MG capsule, Take 1 capsule (50 mg total) by mouth 2 (two) times daily., Disp: 180 capsule,  Rfl: 1   tiZANidine  (ZANAFLEX ) 4 MG tablet, Take 1 tablet (4 mg total) by mouth every 6 (six) hours as needed for muscle spasms., Disp: 30 tablet, Rfl: 0   torsemide  (DEMADEX ) 20 MG tablet, Take 3 tablets (60 mg total) by mouth daily., Disp: 90 tablet, Rfl: 11  Allergies  Allergen Reactions   Demeclocycline Hives   Erythromycin Nausea And Vomiting and Other (See Comments)    Severe irritable bowel   Flagyl [Metronidazole] Nausea And Vomiting and Other (See Comments)    Severe irritable bowel   Glucophage [Metformin Hcl] Nausea And Vomiting and Other (See Comments)    "Sick" "I won't take anything that has metformin in it"   Tetracyclines & Related Hives and Rash   Potassium Chloride  Other (See Comments)    Cannot swallow potssium pills, able to tolerate liquid potassium   Tetracycline Other (See Comments)   Diovan [Valsartan] Nausea Only        Sulfa  Antibiotics Other (See Comments) and Rash    As child   Xanax [Alprazolam] Other (See Comments)    Hyperactivity    Review of Systems Objective:  There were no vitals filed for this visit.  General: Well developed, nourished, in no acute distress, alert and oriented x3   Dermatological: Skin is warm, dry and supple bilateral. Nails x 10 are well maintained; remaining integument appears unremarkable at this time. There are no open sores, no preulcerative lesions, no rash or signs of infection present.  Vascular: Dorsalis Pedis artery and Posterior Tibial artery pedal pulses are 2/4 bilateral with immedate capillary fill time. Pedal hair growth present. No varicosities and no lower extremity edema present bilateral.   Neruologic: Grossly intact via light  touch bilateral. Vibratory intact via tuning fork bilateral. Protective threshold with Semmes Wienstein monofilament intact to all pedal sites bilateral. Patellar and Achilles deep tendon reflexes 2+ bilateral. No Babinski or clonus noted bilateral.   Musculoskeletal: No gross boney  pedal deformities bilateral. No pain, crepitus, or limitation noted with foot and ankle range of motion bilateral. Muscular strength 5/5 in all groups tested bilateral.  Edema of the left foot the majority of the pain is overlying the dorsal lateral aspect of the left foot border is red hot swollen most likely gout labs taken recently do demonstrate uric acid level over 11.0.  Gait: Unassisted, Nonantalgic.    Radiographs:  Radiographs taken today demonstrate considerable edema in the soft tissues.  Left foot rear foot midfoot almost unrecognizable secondary to severe osteoarthritic changes and coalitions with severe calcification  Assessment & Plan:   Assessment: Gouty capsulitis osteoarthritis left  Plan: Discussed etiology pathology and surgical therapies at this point recommended an injection and that she start her colchicine.  I injected 20 mg grams of Kenalog 5 mg Marcaine  to the point of maximal tenderness left foot tolerated procedure well.  Follow-up with her in 3 weeks     Guss Farruggia T. [place], [place]

## 2023-11-28 ENCOUNTER — Inpatient Hospital Stay

## 2023-11-28 ENCOUNTER — Inpatient Hospital Stay (HOSPITAL_BASED_OUTPATIENT_CLINIC_OR_DEPARTMENT_OTHER): Admitting: Nurse Practitioner

## 2023-11-28 ENCOUNTER — Inpatient Hospital Stay: Attending: Internal Medicine

## 2023-11-28 ENCOUNTER — Encounter: Payer: Self-pay | Admitting: Nurse Practitioner

## 2023-11-28 VITALS — BP 98/50 | HR 72 | Temp 98.2°F | Resp 12 | Ht 64.5 in | Wt 243.1 lb

## 2023-11-28 VITALS — BP 99/46 | HR 64

## 2023-11-28 DIAGNOSIS — D509 Iron deficiency anemia, unspecified: Secondary | ICD-10-CM | POA: Diagnosis not present

## 2023-11-28 DIAGNOSIS — E876 Hypokalemia: Secondary | ICD-10-CM | POA: Insufficient documentation

## 2023-11-28 DIAGNOSIS — I700 Atherosclerosis of aorta: Secondary | ICD-10-CM | POA: Insufficient documentation

## 2023-11-28 DIAGNOSIS — E1122 Type 2 diabetes mellitus with diabetic chronic kidney disease: Secondary | ICD-10-CM | POA: Diagnosis not present

## 2023-11-28 DIAGNOSIS — K625 Hemorrhage of anus and rectum: Secondary | ICD-10-CM

## 2023-11-28 DIAGNOSIS — Z794 Long term (current) use of insulin: Secondary | ICD-10-CM | POA: Insufficient documentation

## 2023-11-28 DIAGNOSIS — Z9884 Bariatric surgery status: Secondary | ICD-10-CM | POA: Insufficient documentation

## 2023-11-28 DIAGNOSIS — E611 Iron deficiency: Secondary | ICD-10-CM

## 2023-11-28 DIAGNOSIS — I130 Hypertensive heart and chronic kidney disease with heart failure and stage 1 through stage 4 chronic kidney disease, or unspecified chronic kidney disease: Secondary | ICD-10-CM | POA: Diagnosis not present

## 2023-11-28 DIAGNOSIS — Z7984 Long term (current) use of oral hypoglycemic drugs: Secondary | ICD-10-CM | POA: Insufficient documentation

## 2023-11-28 DIAGNOSIS — Z90710 Acquired absence of both cervix and uterus: Secondary | ICD-10-CM | POA: Insufficient documentation

## 2023-11-28 DIAGNOSIS — Z7901 Long term (current) use of anticoagulants: Secondary | ICD-10-CM | POA: Diagnosis not present

## 2023-11-28 DIAGNOSIS — E785 Hyperlipidemia, unspecified: Secondary | ICD-10-CM | POA: Diagnosis not present

## 2023-11-28 DIAGNOSIS — Z79899 Other long term (current) drug therapy: Secondary | ICD-10-CM | POA: Insufficient documentation

## 2023-11-28 DIAGNOSIS — Z8719 Personal history of other diseases of the digestive system: Secondary | ICD-10-CM | POA: Diagnosis not present

## 2023-11-28 DIAGNOSIS — K648 Other hemorrhoids: Secondary | ICD-10-CM | POA: Diagnosis not present

## 2023-11-28 DIAGNOSIS — N183 Chronic kidney disease, stage 3 unspecified: Secondary | ICD-10-CM | POA: Insufficient documentation

## 2023-11-28 DIAGNOSIS — Z7951 Long term (current) use of inhaled steroids: Secondary | ICD-10-CM | POA: Diagnosis not present

## 2023-11-28 DIAGNOSIS — D500 Iron deficiency anemia secondary to blood loss (chronic): Secondary | ICD-10-CM | POA: Insufficient documentation

## 2023-11-28 DIAGNOSIS — M1990 Unspecified osteoarthritis, unspecified site: Secondary | ICD-10-CM | POA: Insufficient documentation

## 2023-11-28 DIAGNOSIS — I2510 Atherosclerotic heart disease of native coronary artery without angina pectoris: Secondary | ICD-10-CM | POA: Insufficient documentation

## 2023-11-28 DIAGNOSIS — E559 Vitamin D deficiency, unspecified: Secondary | ICD-10-CM

## 2023-11-28 LAB — CBC WITH DIFFERENTIAL (CANCER CENTER ONLY)
Abs Immature Granulocytes: 0.21 10*3/uL — ABNORMAL HIGH (ref 0.00–0.07)
Basophils Absolute: 0.1 10*3/uL (ref 0.0–0.1)
Basophils Relative: 1 %
Eosinophils Absolute: 0.1 10*3/uL (ref 0.0–0.5)
Eosinophils Relative: 1 %
HCT: 36.1 % (ref 36.0–46.0)
Hemoglobin: 11.2 g/dL — ABNORMAL LOW (ref 12.0–15.0)
Immature Granulocytes: 2 %
Lymphocytes Relative: 27 %
Lymphs Abs: 3.1 10*3/uL (ref 0.7–4.0)
MCH: 28.3 pg (ref 26.0–34.0)
MCHC: 31 g/dL (ref 30.0–36.0)
MCV: 91.2 fL (ref 80.0–100.0)
Monocytes Absolute: 0.7 10*3/uL (ref 0.1–1.0)
Monocytes Relative: 6 %
Neutro Abs: 7.3 10*3/uL (ref 1.7–7.7)
Neutrophils Relative %: 63 %
Platelet Count: 396 10*3/uL (ref 150–400)
RBC: 3.96 MIL/uL (ref 3.87–5.11)
RDW: 14.7 % (ref 11.5–15.5)
WBC Count: 11.5 10*3/uL — ABNORMAL HIGH (ref 4.0–10.5)
nRBC: 0 % (ref 0.0–0.2)

## 2023-11-28 LAB — CMP (CANCER CENTER ONLY)
ALT: 17 U/L (ref 0–44)
AST: 20 U/L (ref 15–41)
Albumin: 2.9 g/dL — ABNORMAL LOW (ref 3.5–5.0)
Alkaline Phosphatase: 159 U/L — ABNORMAL HIGH (ref 38–126)
Anion gap: 12 (ref 5–15)
BUN: 28 mg/dL — ABNORMAL HIGH (ref 8–23)
CO2: 24 mmol/L (ref 22–32)
Calcium: 8.7 mg/dL — ABNORMAL LOW (ref 8.9–10.3)
Chloride: 101 mmol/L (ref 98–111)
Creatinine: 1.32 mg/dL — ABNORMAL HIGH (ref 0.44–1.00)
GFR, Estimated: 41 mL/min — ABNORMAL LOW (ref >60)
Glucose, Bld: 324 mg/dL — ABNORMAL HIGH (ref 70–99)
Potassium: 4.1 mmol/L (ref 3.5–5.1)
Sodium: 137 mmol/L (ref 135–145)
Total Bilirubin: 0.5 mg/dL (ref 0.0–1.2)
Total Protein: 7.6 g/dL (ref 6.5–8.1)

## 2023-11-28 LAB — VITAMIN B12: Vitamin B-12: 283 pg/mL (ref 180–914)

## 2023-11-28 MED ORDER — SODIUM CHLORIDE 0.9 % IV SOLN
Freq: Once | INTRAVENOUS | Status: AC
Start: 2023-11-28 — End: 2023-11-28
  Filled 2023-11-28: qty 250

## 2023-11-28 MED ORDER — SODIUM CHLORIDE 0.9 % IV SOLN
510.0000 mg | Freq: Once | INTRAVENOUS | Status: AC
  Administered 2023-11-28: 510 mg via INTRAVENOUS
  Filled 2023-11-28: qty 17

## 2023-11-28 NOTE — Patient Instructions (Signed)

## 2023-11-28 NOTE — Progress Notes (Signed)
  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 Adrian Alba EGD/colonoscopy- ?2021 [Dr.Wohl]. NOV 2021-hemoglobin 10. Iron  saturation 5% ferritin 12. EGD- NOV 2022- Dr.Wohl.  # GASTRIC BYPASS x2 [2005]; CAD/CHF [s/p stent; Dr.End]; CKD stage III [Dr. Korrapati]; poorly controlled diabetes- [Dr.Tullo]  HISTORY OF PRESENTING ILLNESS: Alone. Walking with a rolling walker.  Denise Sharp 78 y.o. female with multiple medical comorbidities including CHF on diuretics, hypokalemia, iron  deficiency, secondary to gastric bypass, poorly controlled DM, who returns to clinic for follow up. She says she feels 'lousy'. Has been struggling with gout like symptoms for several months and was seen by podiatry yesterday. She was previously on prednisone  which she says elevated her blood sugars into the 400s. She expresses multiple frustrations as her care recently and chronically. She has chronic heart failure and is treated with diuretics which she dislikes.   She endorses bright red blood both in toilet and in her underwear. Unsure if vaginal or rectal but says she has a history of internal hemorrhoids. She is s/p hysterectomy. Denies vaginal irritation, lesions, or discomfort. Appetite is stable. She denies abdominal pain, nausea, vomiting, or changes in bowel movements. She has chronic pain. Was previously followed by Dr Ole Berkeley with GI but is now without a GI physician due to his position change and practice closure. She says that she is unable to go to [facility] due to being dismissed from the  clinic a decade + ago for unknown reasons.   Review of Systems  Constitutional:  Positive for malaise/fatigue. Negative for chills, diaphoresis, fever and weight loss.  HENT:  Negative for nosebleeds.   Eyes:  Negative for double vision.  Respiratory:  Positive for shortness of breath. Negative for cough, hemoptysis, sputum production and wheezing.   Cardiovascular:  Positive for leg swelling. Negative for chest pain, palpitations and orthopnea.  Gastrointestinal:  Positive for blood in stool. Negative for abdominal pain, constipation, heartburn, melena, nausea and vomiting.  Genitourinary:  Negative for dysuria, flank pain, frequency, hematuria and urgency.  Musculoskeletal:  Positive for back pain, joint pain and myalgias. Negative for falls and neck pain.       Foot pain L > R  Skin:  Negative for itching and rash.  Neurological:  Negative for dizziness, tingling, focal weakness, weakness and headaches.  Endo/Heme/Allergies:  Does not bruise/bleed easily.  Psychiatric/Behavioral:  Negative for depression. The patient is not nervous/anxious and does not have insomnia.    MEDICAL HISTORY:  Past Medical History:  Diagnosis Date   (HFpEF) heart failure with preserved ejection fraction (HCC)    a. 12/2019 Echo: EF 65-70%, Gr2 DD. No significant valvular dzs.   Abnormality of gait 03/25/2013   Adrenal mass, left (HCC)    Anginal pain (HCC)    Anxiety    Aortic atherosclerosis (HCC)    Arthritis    Asthma    CAD (coronary artery disease) with h/o Atypical Chest Pain    a. 04/1986 Cath ([facility]): nl cors, EF 65%; b. 07/2012 Cath [facility]): Diff minor irregs; c. 07/2016 MV [facility]): "Equivocal"; d. 08/2016 Cardiac CT Ca2+ score [facility]): Ca2+ 1548; e. 05/2019 MV: No ischemia. EF 75%;  f. 12/2019 PCI: LM nl, LAD 65ost (3.5x12 Resolute DES), 91m (2.75x12 Resolute DES),LCX/RCA nl; g. 08/2020 Cath: patent LAD stents, RCA 40ost, elev RH pressures, EF >65%.   Cervical spinal stenosis 1994   due to trauma to back  ([employer] accident), has intermittent paralysis and parasthesias   Cervicogenic headache 03/23/2014   CKD (chronic kidney disease), stage III (HCC)    Depression    Diverticulosis    Dizziness    a.) chronic   DJD (degenerative joint disease)    a. Chronic R shoulder pain   Dyspnea    Esophageal stenosis 09/2009   a.) transient outlet obstruction by food, cleared by EGD   Family history of adverse reaction to anesthesia    a.) daughter with (+) PONV   Gastric bypass status for obesity    GERD (gastroesophageal reflux disease)    Headache(784.0)    HLD (hyperlipidemia)    Hypertension    IBS (irritable bowel syndrome)    IDA (iron  deficiency anemia)    a.) post 2 unit txfsn 2009, normal endo/colonoscopy by Wohl   Left bundle branch block (LBBB)    a.) Intermittently present - likely rate related.   NSVT (nonsustained ventricular tachycardia) (HCC) 11/15/2020   a.) Holter study 11/15/2020; 11 episodes of NSVT lasting up to 8 beats with a maximum rate of 210 bpm; 14 atrial runs lasting up to 18 beats with a rate of up to 250 bpm.  Some atrial runs felt to be NSVT.   NSVT (nonsustained ventricular tachycardia) (HCC)    a. 10/2020 [facility]: 11 runs of NSVT up to 8 beats.   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 10/2020 [facility]io: 14 atrial runs up to 18 beats, max HR 250.   Restless leg syndrome    Rotator cuff arthropathy, right 08/13/2017   Syncope and collapse 03/12/2014   Type II diabetes mellitus (HCC)     SURGICAL HISTORY: Past Surgical History:  Procedure Laterality Date   APPENDECTOMY     BACK SURGERY     CARDIOVERSION N/A 10/30/2022   Procedure: CARDIOVERSION;  Surgeon: Sammy Crisp, MD;  Location: [facility];  Service: Cardiovascular;  Laterality: N/A;   CHOLECYSTECTOMY N/A 1976   COLONOSCOPY     CORONARY ANGIOPLASTY      CORONARY STENT INTERVENTION N/A 01/04/2020   Procedure: CORONARY STENT INTERVENTION;  Surgeon: Wenona Hamilton, MD;  Location: [facility];  Service: Cardiovascular;  Laterality: N/A;  LAD    DIAPHRAGMATIC HERNIA REPAIR  2015   ESOPHAGEAL DILATION     multiple   ESOPHAGOGASTRODUODENOSCOPY (EGD) WITH PROPOFOL  N/A 05/11/2021   Procedure: ESOPHAGOGASTRODUODENOSCOPY (EGD) WITH PROPOFOL ;  Surgeon: Marnee Sink, MD;  Location: [facility];  Service: Endoscopy;  Laterality: N/A;   ESOPHAGOGASTRODUODENOSCOPY (EGD) WITH PROPOFOL  N/A 06/13/2021   Procedure: ESOPHAGOGASTRODUODENOSCOPY (EGD) WITH PROPOFOL ;  Surgeon: Marnee Sink, MD;  Location: [facility];  Service: Endoscopy;  Laterality: N/A;   ESOPHAGOGASTRODUODENOSCOPY (EGD) WITH PROPOFOL  N/A 02/28/2023   Procedure: ESOPHAGOGASTRODUODENOSCOPY (EGD) WITH PROPOFOL ;  Surgeon: Marnee Sink, MD;  Location: [facility];  Service: Endoscopy;  Laterality: N/A;   EYE SURGERY     GASTRIC BYPASS  2000, 2005   Dr. Joanne Muckle IMPLANT PLACEMENT  10/2011   Saratha Cunas IMPLANT PLACEMENT N/A 09/18/2021   Procedure: Simona [place] IMPLANT FIRST STAGE;  Surgeon:  Erman Hayward, MD;  Location: [facility];  Service: Urology;  Laterality: N/A;   INTERSTIM IMPLANT PLACEMENT N/A 09/18/2021   Procedure: Simona [place] IMPLANT SECOND STAGE WITH IMPEDENCE CHECK;  Surgeon: Erman Hayward, MD;  Location: [facility];  Service: Urology;  Laterality: N/A;   INTERSTIM IMPLANT REMOVAL N/A 09/18/2021   Procedure: REMOVAL OF INTERSTIM IMPLANT;  Surgeon: Erman Hayward, MD;  Location: [facility];  Service: Urology;  Laterality: N/A;   LEFT HEART CATH AND CORONARY ANGIOGRAPHY N/A 04/1986   LEFT HEART CATH AND CORONARY ANGIOGRAPHY Left 07/24/2012   LEFT HEART CATH AND CORS/GRAFTS ANGIOGRAPHY N/A 12/31/2019   Procedure: LEFT HEART CATH AND CORS/GRAFTS ANGIOGRAPHY;  Surgeon: Devorah Fonder, MD;  Location: [facility];  Service: Cardiovascular;   Laterality: N/A;   PANNICULECTOMY N/A 06/16/2019   Procedure: PANNICULECTOMY;  Surgeon: Barb Bonito, MD;  Location: [facility];  Service: Plastics;  Laterality: N/A;  3 hours, please   REVERSE SHOULDER ARTHROPLASTY Right 08/13/2017   Procedure: REVERSE RIGHT SHOULDER ARTHROPLASTY;  Surgeon: Osa Blase, MD;  Location: [facility];  Service: Orthopedics;  Laterality: Right;   RIGHT/LEFT HEART CATH AND CORONARY ANGIOGRAPHY N/A 09/06/2020   Procedure: RIGHT/LEFT HEART CATH AND CORONARY ANGIOGRAPHY;  Surgeon: Sammy Crisp, MD;  Location: [facility];  Service: Cardiovascular;  Laterality: N/A;   RIGHT/LEFT HEART CATH AND CORONARY ANGIOGRAPHY N/A 06/04/2022   Procedure: RIGHT/LEFT HEART CATH AND CORONARY ANGIOGRAPHY;  Surgeon: Sammy Crisp, MD;  Location: [facility];  Service: Cardiovascular;  Laterality: N/A;   ROTATOR CUFF REPAIR Right    SPINE SURGERY  1995   [place]   TOOTH EXTRACTION  11/13/2021   TOTAL ABDOMINAL HYSTERECTOMY W/ BILATERAL SALPINGOOPHORECTOMY  1974   TOTAL KNEE ARTHROPLASTY Bilateral 2007   Procedure: TOTAL KNEE ARTHROPLASTY; Surgeons: Glinda Lapping, MD and [name], MD   UMBILICAL HERNIA REPAIR  08/11/2015    SOCIAL HISTORY: Social History   Socioeconomic History   Marital status: Married    Spouse name: Baker Bon    Number of children: 2   Years of education: College    Highest education level: Not on file  Occupational History    Employer: retired  Tobacco Use   Smoking status: Never    Passive exposure: Never   Smokeless tobacco: Never  Vaping Use   Vaping status: Never Used  Substance and Sexual Activity   Alcohol  use: No   Drug use: Not Currently    Comment: prescribed valium    Sexual activity: Not Currently    Birth control/protection: Post-menopausal, Surgical  Other Topics Concern   Not on file  Social History Narrative   Patient lives at home with husband Baker Bon.    Right Handed   Drinks caffeinated tea occasionally   One pet in house, dog    Social Drivers of Health   Financial Resource Strain: Low Risk  (09/15/2021)   Overall Financial Resource Strain (CARDIA)    Difficulty of Paying Living Expenses: Not hard at all  Food Insecurity: No Food Insecurity (04/05/2023)   Hunger Vital Sign    Worried About Running Out of Food in the Last Year: Never true    Ran Out of Food in the Last Year: Never true  Transportation Needs: No Transportation Needs (04/05/2023)   PRAPARE - Administrator, Civil Service (Medical): No    Lack of Transportation (Non-Medical): No  Physical Activity: Insufficiently Active (11/15/2020)   Exercise Vital Sign    Days of Exercise per Week: 2 days  Minutes of Exercise per Session: 40 min  Stress: Stress Concern Present (08/03/2020)   [employer] of Occupational Health - Occupational Stress Questionnaire    Feeling of Stress : Rather much  Social Connections: Not on file  Intimate Partner Violence: Not At Risk (03/28/2023)   Humiliation, Afraid, Rape, and Kick questionnaire    Fear of Current or Ex-Partner: No    Emotionally Abused: No    Physically Abused: No    Sexually Abused: No    FAMILY HISTORY: Family History  Problem Relation Age of Onset   Heart disease Father    Hypertension Father    Prostate cancer Father    Stroke Father    Osteoporosis Father    Stroke Mother    Depression Mother    Headache Mother    Heart disease Mother    Thyroid  disease Mother    Hypertension Mother    Diabetes Daughter    Heart disease Daughter    Hypertension Daughter    Hypertension Son     ALLERGIES:  is allergic to demeclocycline, erythromycin, flagyl [metronidazole], glucophage [metformin hcl], tetracyclines & related, potassium chloride , tetracycline, diovan [valsartan], sulfa  antibiotics, and xanax [alprazolam].  MEDICATIONS:  Current Outpatient Medications  Medication Sig Dispense Refill   apixaban  (ELIQUIS ) 5 MG TABS tablet Take 1 tablet (5 mg total) by mouth 2 (two) times  daily. 180 tablet 1   atorvastatin  (LIPITOR ) 80 MG tablet Take 1 tablet (80 mg total) by mouth daily. 90 tablet 1   budesonide -formoterol  (SYMBICORT ) 160-4.5 MCG/ACT inhaler Inhale 2 puffs into the lungs 2 (two) times daily. 1 each 6   butalbital -acetaminophen -caffeine  (FIORICET ) 50-325-40 MG tablet TAKE 1 TABLET BY MOUTH EVERY 6 HOURS AS NEEDED FOR HEADACHE. 60 tablet 2   Cholecalciferol  (VITAMIN D3) 250 MCG (10000 UT) capsule Take 10,000 Units by mouth daily.     clotrimazole -betamethasone  (LOTRISONE ) cream Apply 1 application topically 2 (two) times daily as needed (irritation).     colchicine 0.6 MG tablet Take 1 tablet (0.6 mg total) by mouth daily. 30 tablet 11   conjugated estrogens  (PREMARIN ) vaginal cream Apply one pea-sized amount around the opening of the urethra daily for 2 weeks, then 3 times weekly moving forward. 30 g 4   Continuous Blood Gluc Receiver (FREESTYLE LIBRE 2 READER) DEVI Use to check sugar at least TID 1 each 1   Continuous Glucose Sensor (FREESTYLE LIBRE 3 PLUS SENSOR) MISC Change sensor every 15 days. 6 each 3   cyanocobalamin  (VITAMIN B12) 1000 MCG/ML injection Inject 1 mL (1,000 mcg total) into the muscle every 30 (thirty) days. Inject 1 ml (1000 mcg ) IM weekly x 4,  Then monthly thereafter 3 mL 1   dapagliflozin  propanediol (FARXIGA ) 10 MG TABS tablet Take 1 tablet (10 mg total) by mouth daily. 90 tablet 3   dicyclomine  (BENTYL ) 20 MG tablet Take 1 tablet (20 mg total) by mouth 3 (three) times daily before meals. 180 tablet 2   DULoxetine  (CYMBALTA ) 60 MG capsule Take 1 capsule (60 mg total) by mouth daily. 90 capsule 3   ezetimibe  (ZETIA ) 10 MG tablet Take 1 tablet (10 mg total) by mouth daily. 90 tablet 3   fexofenadine (ALLEGRA) 180 MG tablet Take 180 mg by mouth as needed for allergies or rhinitis.     Finerenone  (KERENDIA ) 10 MG TABS Take 1 tablet (10 mg total) by mouth in the morning. 90 tablet 3   fluticasone  (FLONASE ) 50 MCG/ACT nasal spray Place 2 sprays  into both nostrils  daily. 16 g 6   Glucagon  (GVOKE HYPOPEN  2-PACK) 0.5 MG/0.1ML SOAJ Inject 1 auto injector subcutaneously as needed for severe hypoglycemia. May repeat x 1 after 15 minutes if needed. 0.1 mL 2   glucose blood (ONETOUCH VERIO) test strip Use to check blood sugar twice daily. 100 each 1   hydrocortisone  (ANUSOL -HC) 25 MG suppository Place 1 suppository (25 mg total) rectally 2 (two) times daily. 24 suppository 5   insulin  aspart (NOVOLOG  FLEXPEN) 100 UNIT/ML FlexPen Inject 10-16 Units into the skin 3 (three) times daily with meals. 36 mL 3   insulin  degludec (TRESIBA  FLEXTOUCH) 100 UNIT/ML FlexTouch Pen Inject 30 Units into the skin daily. 30 mL 0   Insulin  Pen Needle (DROPLET PEN NEEDLES) 31G X 5 MM MISC USE ONE NEEDLE SUBCUTANEOUSLY AS DIRECTED (REMOVE AND DISCARD NEEDLE IN SHARPS CONTAINER IMMEDIATELY AFTER USE) 100 each 1   lipase/protease/amylase (CREON ) 36000 UNITS CPEP capsule Take 2 capsules (72,000 Units total) by mouth 3 (three) times daily before meals AND 1 capsule (36,000 Units total) with snacks. 300 capsule 9   loperamide  (IMODIUM ) 2 MG capsule Take 4-6 mg by mouth 3 (three) times daily before meals.     nitroGLYCERIN  (NITROSTAT ) 0.4 MG SL tablet Place 1 tablet (0.4 mg total) under the tongue every 5 (five) minutes as needed for chest pain. 25 tablet 5   nystatin  (MYCOSTATIN /NYSTOP ) powder Apply 1 g topically 4 (four) times daily as needed (rash).     ondansetron  (ZOFRAN -ODT) 4 MG disintegrating tablet Take 1 tablet (4 mg total) by mouth every 8 (eight) hours as needed for nausea or vomiting. 30 tablet 0   OneTouch Delica Lancets 33G MISC Used to check blood sugar two times a day. 100 each 4   OVER THE COUNTER MEDICATION Apply 1 application  topically daily as needed (pain). [place] Gel topical pain reliever     pantoprazole  (PROTONIX ) 40 MG tablet Take 1 tablet (40 mg total) by mouth daily. 90 tablet 3   Polyethyl Glycol-Propyl Glycol (SYSTANE OP) Place 1 drop into both  eyes 4 (four) times daily as needed (dry eyes).     potassium chloride  20 MEQ/15ML (10%) SOLN Take 15 mLs (20 mEq total) by mouth daily. 473 mL 3   pregabalin  (LYRICA ) 50 MG capsule Take 1 capsule (50 mg total) by mouth 2 (two) times daily. 180 capsule 1   tiZANidine  (ZANAFLEX ) 4 MG tablet Take 1 tablet (4 mg total) by mouth every 6 (six) hours as needed for muscle spasms. 30 tablet 0   torsemide  (DEMADEX ) 20 MG tablet Take 3 tablets (60 mg total) by mouth daily. 90 tablet 11   No current facility-administered medications for this visit.     PHYSICAL EXAMINATION: Vitals:   11/28/23 1341  BP: (!) 98/50  Pulse: 72  Resp: 12  Temp: 98.2 F (36.8 C)  SpO2: 100%   Filed Weights   11/28/23 1341  Weight: 243 lb 1.6 oz (110.3 kg)   Physical Exam Vitals reviewed.  Constitutional:      Appearance: She is obese. She is not ill-appearing.     Comments: Obese.  HENT:     Head: Normocephalic and atraumatic.     Mouth/Throat:     Pharynx: No oropharyngeal exudate.  Eyes:     General: No scleral icterus. Pulmonary:     Effort: Pulmonary effort is normal. No respiratory distress.  Abdominal:     General: There is no distension.  Musculoskeletal:        General:  No tenderness.     Right lower leg: Edema present.     Left lower leg: Edema present.     Comments: Edematous. Walker for ambulation. Reports gout.   Skin:    General: Skin is dry.     Coloration: Skin is not pale.     Findings: No rash.  Neurological:     Mental Status: She is alert and oriented to person, place, and time.  Psychiatric:        Mood and Affect: Mood and affect normal.        Behavior: Behavior normal.    LABORATORY DATA:  I have reviewed the data as listed Lab Results  Component Value Date   WBC 11.5 (H) 11/28/2023   HGB 11.2 (L) 11/28/2023   HCT 36.1 11/28/2023   MCV 91.2 11/28/2023   PLT 396 11/28/2023   Recent Labs    12/14/22 0038 02/13/23 1228 03/26/23 1430 03/28/23 1213 03/29/23 0443  07/01/23 1315 07/26/23 0950 10/21/23 1417 11/06/23 1710  NA  --    < > 140 142   < > 134* 136 137 135  K  --    < > 3.2* 3.0*   < > 4.0 3.7 3.8 3.6  CL  --    < > 109 106   < > 94* 98 93* 91*  CO2  --    < > 22 22   < > 26 26 21 28   GLUCOSE  --    < > 144* 102*   < > 269* 325* 239* 228*  BUN  --    < > 10 8   < > 45* 31* 30* 45*  CREATININE  --    < > 1.31* 1.03*   < > 1.72* 1.53* 1.42* 1.69*  CALCIUM   --    < > 7.9* 8.5*   < > 8.7* 8.4* 8.8 8.6*  GFRNONAA  --    < > 42* 56*   < > 30* 35*  --  31*  PROT 7.0  --  6.5 7.3  --   --  6.9  --   --   ALBUMIN  3.3*  --  3.2* 3.6  --   --  3.1*  --   --   AST 25  --  18 19  --   --  22  --   --   ALT 22  --  16 16  --   --  18  --   --   ALKPHOS 127*  --  109 125  --   --  115  --   --   BILITOT 0.5  --  0.5 1.0  --   --  0.6  --   --   BILIDIR 0.1  --   --   --   --   --   --   --   --   IBILI 0.4  --   --   --   --   --   --   --   --    < > = values in this interval not displayed.   Iron /TIBC/Ferritin/ %Sat    Component Value Date/Time   IRON  35 03/26/2023 1430   IRON  33 (L) 10/30/2012 1436   TIBC 232 (L) 03/26/2023 1430   TIBC 312 10/30/2012 1436   FERRITIN 262 03/26/2023 1430   FERRITIN 36 10/30/2012 1436   IRONPCTSAT 15 03/26/2023 1430   IRONPCTSAT 8 (L) 03/06/2019 1624  DG Foot Complete Left Result Date: 11/27/2023 Please see detailed radiograph report in office note.  VAS US  LOWER EXTREMITY VENOUS (DVT) Result Date: 11/22/2023  Lower Venous DVT Study Patient Name:  TRISTON BLAKEY  Date of Exam:   11/19/2023 Medical Rec #: [id number]          Accession #:    [id number] Date of Birth: [birth year]          Patient Gender: F Patient Age:   21 years Exam Location:  [facility] Procedure:      VAS US  LOWER EXTREMITY VENOUS (DVT) Referring Phys: TINA HACKNEY --------------------------------------------------------------------------------  Indications: Pain, and left.  Performing Technologist: Faustine Hoof RVT   Examination Guidelines: A complete evaluation includes B-mode imaging, spectral Doppler, color Doppler, and power Doppler as needed of all accessible portions of each vessel. Bilateral testing is considered an integral part of a complete examination. Limited examinations for reoccurring indications may be performed as noted. The reflux portion of the exam is performed with the patient in reverse Trendelenburg.  +-----+---------------+---------+-----------+----------+--------------+ RIGHTCompressibilityPhasicitySpontaneityPropertiesThrombus Aging +-----+---------------+---------+-----------+----------+--------------+ CFV  Full           Yes                                          +-----+---------------+---------+-----------+----------+--------------+ SFJ  Full           Yes                                          +-----+---------------+---------+-----------+----------+--------------+   +---------+---------------+---------+-----------+----------+--------------+ LEFT     CompressibilityPhasicitySpontaneityPropertiesThrombus Aging +---------+---------------+---------+-----------+----------+--------------+ CFV      Full           Yes                                          +---------+---------------+---------+-----------+----------+--------------+ SFJ      Full           Yes                                          +---------+---------------+---------+-----------+----------+--------------+ FV Prox  Full                                                        +---------+---------------+---------+-----------+----------+--------------+ FV Mid   Full           Yes                                          +---------+---------------+---------+-----------+----------+--------------+ FV DistalFull                                                        +---------+---------------+---------+-----------+----------+--------------+  PFV      Full           Yes                                           +---------+---------------+---------+-----------+----------+--------------+ POP      Full           Yes                                          +---------+---------------+---------+-----------+----------+--------------+ PTV      Full           Yes                                          +---------+---------------+---------+-----------+----------+--------------+ Gastroc  Full           Yes                                          +---------+---------------+---------+-----------+----------+--------------+ GSV      Full           Yes                                          +---------+---------------+---------+-----------+----------+--------------+ SSV      Full           Yes                                          +---------+---------------+---------+-----------+----------+--------------+    Summary: RIGHT: - No evidence of common femoral vein obstruction.   LEFT: - No evidence of deep vein thrombosis in the lower extremity. No indirect evidence of obstruction proximal to the inguinal ligament.   *See table(s) above for measurements and observations. Electronically signed by Mikki Alexander MD on 11/22/2023 at 11:10:11 AM.    Final    NM PET CT CARDIAC PERFUSION MULTI W/ABSOLUTE BLOODFLOW Result Date: 11/21/2023   LV perfusion is normal. There is no evidence of ischemia. There is no evidence of infarction.   Rest left ventricular function is normal. Rest EF: 76%. Stress left ventricular function is normal. Stress EF: 77%. End diastolic cavity size is normal. End systolic cavity size is normal.   Myocardial blood flow was computed to be 0.106ml/g/min at rest and 2.16ml/g/min at stress. Global myocardial blood flow reserve was 2.30 and was normal.   Coronary calcium  assessment not performed due to prior revascularization.Prior stents to LAD   Findings are consistent with no ischemia and no infarction. The study is low risk. See caveat regarding TID being  present   TID present at 1.2 visual and quantitative clinical correlation suggested CLINICAL DATA:  This over-read does not include interpretation of cardiac or coronary anatomy or pathology. No interpretation the PET data set. The cardiac PET-CT interpretation by the cardiologist is attached. COMPARISON:  CT abdomen pelvis 10/14/2022 FINDINGS: Limited view of the lung  parenchyma demonstrates no suspicious nodularity. Airways are normal. Limited view of the mediastinum demonstrates no adenopathy. Esophagus normal. Limited view of the upper abdomen demonstrates post bariatric surgery. Nodular thickening of the LEFT adrenal gland to 12 mm not changed from CT 1 year prior. Nodule had imaging characteristics consistent benign adenoma on that scan. Limited view of the skeleton and chest wall is unremarkable. IMPRESSION: 1. Post bariatric surgery. 2. Benign LEFT adrenal adenoma. No follow-up recommended for benign lesion. Electronically Signed   By: Deboraha Fallow M.D.   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    # Hemoglobin today is 11.2. Decreased. Proceed with feraheme  today and second dose in 2-3 weeks.   # Rectal bleeding- colonoscopy 2015 w/ Dr Ole Berkeley showed internal hemorrhoids. With his departure and patient being unable to see [facility], plan to refer to [facility] for evaluation and management. If negative Gi workup, recommend gynecologic evaluation.   # Hx of esophageal thickening- ? Stricture (aug 2021)- EGD with Dr Ole Berkeley 02/2023 was benign appearing esophageal stenosis. Dilated. Hx of gastric bypass.    # Dyspnea- Weight gain/bloated/ poor appetite- Sec to CHF vs others. [Dr.End]-on Torsemide - follow up with heart failure clinic.    # Hypokalemia- 3.5-continue K supplementation.    # DM- poor controlled BG 325- Per patient, due to recent steroids.  Encouraged weight loss and dietary discretion. She is followed by endocrinology.    # B12 deficiency secondary gastric bypass at home iron  injections- stable    # DISPOSITION: Feraheme  today 2-3 weeks feraheme  Ref gi for rectal bleeding 4 mo- lab (cbc, cmp, b12, ferritin, iron  studies), Dr Valentine Gasmen, +/- feraheme - l  No problem-specific Assessment & Plan notes found for this encounter.  All questions were answered. The patient knows to call the clinic with any problems, questions or concerns.   Nelda Balsam, NP 11/28/2023

## 2023-11-29 LAB — VITAMIN D 25 HYDROXY (VIT D DEFICIENCY, FRACTURES): Vit D, 25-Hydroxy: 55.71 ng/mL (ref 30–100)

## 2023-11-30 NOTE — Progress Notes (Signed)
 Advanced Heart Failure Clinic Note    PCP: Thersia Flax, MD (last seen 03/25) Cardiologist: Sammy Crisp, MD / Toribio Frees, PA (last seen 04/25) HF provider: Alwin Baars, MD (last seen 12/24)  Chief Complaint: foot pain  HPI:  Denise Sharp is a 78 y/o female with a history of HFpEF, CAD status post PCI to the LAD, persistent atrial fibrillation, type 2 diabetes, CKD and morbid obesity presenting today for follow up of chronic diastolic heart failure.  Echo 03/25/12: EF 55-65% with mild AR Echo 08/21/17: EF 65-70% with Grade II DD and mildly elevated PA pressure of 33 mmHg Echo 12/30/19: EF 65-70% with Grade II DD  RHC/ LHC 06/04/22: Mild-moderate, non-obstructive coronary artery disease, similar to prior catheterization in 08/2020. Widely patent ostial LAD stent. Patent mid LAD stent with minimal in stent restenosis (~10% narrowing). Upper normal left and right heart filling pressures. Borderline elevated pulmonary artery pressure. Normal to mildly reduced cardiac output/index.  Admitted 03/28/23 due to 30 pound weight gain requiring IV diuresis.  At that time she was taking Lasix  20 mg once daily to once every other day. She was admitted to [facility] where echocardiogram demonstrated preserved EF with grade 2 diastolic dysfunction. She was diuresed 4 L through admission and transition to torsemide  20 mg daily at discharge. Throughout admission she maintained normal sinus rhythm. She did have rectal bleeding that was secondary to hemorrhoids.  Patient reports being frustrated about the continued pain in her left foot and ankle. The extremity is warm and hot to the touch, is difficult to put weight on, and has severely affected her quality of life. She notes that "no doctors have wanted to treat her gout", though she was put on prednisone  recently with some mild improvement, though worsening BS. She reports that taking the high doses of torsemide  have not helped with the swelling.  She stays fatigued.    Vitals:   11/26/23 1059  BP: 124/60  Pulse: 78  SpO2: 100%  Weight: 244 lb 12.8 oz (111 kg)   Wt Readings from Last 3 Encounters:  11/28/23 243 lb 1.6 oz (110.3 kg)  11/26/23 244 lb 12.8 oz (111 kg)  11/06/23 241 lb (109.3 kg)   Lab Results  Component Value Date   CREATININE 1.32 (H) 11/28/2023   CREATININE 1.69 (H) 11/06/2023   CREATININE 1.42 (H) 10/21/2023    PHYSICAL EXAM:  General: tired and chronically ill appearing Cor: Regular rhythm, rate. No rubs, gallops or murmurs, no JVP Lungs: clear Abdomen: soft, nontender, nondistended. Extremities: no cyanosis, clubbing, rash, trace pitting edema right lower leg, 2+ pitting edema left lower leg; left lower extremity is warm  and tender to touch Neuro: alert & oriented X 3. Moves all 4 extremities w/o difficulty.    ECG: Not done   ASSESSMENT & PLAN:  1: NICM with preserved ejection fraction- - suspect due to obesity, atrial fibrillation, vascular disease, hypertension, CKD - NYHA class III: symptoms 2/2 deconditioning as well - appear near euvolemic - Echo 08/21/17: EF 65-70% with Grade II DD and mildly elevated PA pressure of 33 mmHg - Echo 12/30/19: EF 65-70% with Grade II DD - Echo 03/29/23: EF 60-65% with mild LVH, Grade II DD - continue farxiga  10mg  daily - continue finerenone  10mg  daily - Stop spironolactone , already on finerenone  - Decrease toresmide to 60mg  daily - continue potassium 20mEq daily - After gout improves do believe that she would benefit from cardiomems as do not believe her edema is entirely cardiac and  would allow reduction in diuretic dose. Could also consider LEVEL trial   2: HTN- - BP 123/59 - saw PCP Denise Sharp) 03/25 - BMET 5/15 reviewed, elevated glucose, otherwise creatinine stable, BUN improving - BMET today  3: PAF- - saw cardiology Denise Sharp) 04/25 - continue apixaban  5mg  BID - regular rate/ rhythm today  4: OSA- - had video visit with pulmonology Denise Sharp)  10/24 - wearing CPAP nightly  5: Left leg edema- - Prior ultrasound negative,based on appearance and elevated uric acid there is some concern for gout. She sees podiatry tomorrow so if imaging is needed they can arrange. - Otherwise will start of dose adjusted colchicine  0.6mg  daily - After improved can start maintenance therapy with allopurinol, should be able to be managed by PCP   6: Iron  deficiency anemia- - saw hematology Denise Sharp) 09/24 - hemoglobin 04/08/23 was 14.2  7: DM- - saw endocrinology Denise Sharp) 03/25 - A1c 09/30/23 was 9.3% - patient says that glucose is continuing to run high  8. CKD IIIB - With underlying T2DM, HFpEF will continue finerenone  10mg .  - saw nephrology (Denise Sharp) 03/25  9. Deconditioning - she did not qualify for pulmonary rehab due to multiple orthopaedic issues along with severe incontinence - not doing any PT yet  Return in 2 months with Dr. Sabharwal

## 2023-12-03 ENCOUNTER — Encounter (INDEPENDENT_AMBULATORY_CARE_PROVIDER_SITE_OTHER): Payer: Self-pay

## 2023-12-12 ENCOUNTER — Inpatient Hospital Stay

## 2023-12-12 VITALS — BP 113/55 | HR 74 | Temp 99.1°F | Resp 18

## 2023-12-12 DIAGNOSIS — I2510 Atherosclerotic heart disease of native coronary artery without angina pectoris: Secondary | ICD-10-CM | POA: Diagnosis not present

## 2023-12-12 DIAGNOSIS — I130 Hypertensive heart and chronic kidney disease with heart failure and stage 1 through stage 4 chronic kidney disease, or unspecified chronic kidney disease: Secondary | ICD-10-CM | POA: Diagnosis not present

## 2023-12-12 DIAGNOSIS — E611 Iron deficiency: Secondary | ICD-10-CM

## 2023-12-12 DIAGNOSIS — Z79899 Other long term (current) drug therapy: Secondary | ICD-10-CM | POA: Diagnosis not present

## 2023-12-12 DIAGNOSIS — I700 Atherosclerosis of aorta: Secondary | ICD-10-CM | POA: Diagnosis not present

## 2023-12-12 DIAGNOSIS — D500 Iron deficiency anemia secondary to blood loss (chronic): Secondary | ICD-10-CM | POA: Diagnosis not present

## 2023-12-12 DIAGNOSIS — N183 Chronic kidney disease, stage 3 unspecified: Secondary | ICD-10-CM | POA: Diagnosis not present

## 2023-12-12 DIAGNOSIS — D509 Iron deficiency anemia, unspecified: Secondary | ICD-10-CM

## 2023-12-12 MED ORDER — SODIUM CHLORIDE 0.9 % IV SOLN
Freq: Once | INTRAVENOUS | Status: AC
  Filled 2023-12-12: qty 250

## 2023-12-12 MED ORDER — SODIUM CHLORIDE 0.9 % IV SOLN
510.0000 mg | Freq: Once | INTRAVENOUS | Status: AC
  Administered 2023-12-12: 510 mg via INTRAVENOUS
  Filled 2023-12-12: qty 510

## 2023-12-18 ENCOUNTER — Ambulatory Visit: Admitting: Podiatry

## 2023-12-19 ENCOUNTER — Ambulatory Visit (INDEPENDENT_AMBULATORY_CARE_PROVIDER_SITE_OTHER)

## 2023-12-19 VITALS — BP 118/80 | HR 79 | Temp 98.4°F | Ht 64.5 in | Wt 244.0 lb

## 2023-12-19 DIAGNOSIS — R252 Cramp and spasm: Secondary | ICD-10-CM | POA: Insufficient documentation

## 2023-12-19 DIAGNOSIS — M25572 Pain in left ankle and joints of left foot: Secondary | ICD-10-CM | POA: Insufficient documentation

## 2023-12-19 DIAGNOSIS — M19072 Primary osteoarthritis, left ankle and foot: Secondary | ICD-10-CM | POA: Diagnosis not present

## 2023-12-19 DIAGNOSIS — M10372 Gout due to renal impairment, left ankle and foot: Secondary | ICD-10-CM | POA: Insufficient documentation

## 2023-12-19 MED ORDER — LIDOCAINE 5 % EX PTCH: 1.0000 | MEDICATED_PATCH | CUTANEOUS | 0 refills | Status: DC

## 2023-12-19 MED ORDER — TIZANIDINE HCL 4 MG PO TABS: 4.0000 mg | ORAL_TABLET | Freq: Two times a day (BID) | ORAL | 0 refills | Status: DC

## 2023-12-19 MED ORDER — COLCHICINE 0.6 MG PO TABS: 0.6000 mg | ORAL_TABLET | Freq: Two times a day (BID) | ORAL | 0 refills | Status: DC

## 2023-12-19 NOTE — Assessment & Plan Note (Signed)
 Symptoms improved on prn Tizanidine  4mg , mostly taking at night. After counseling on s/e of muscle relaxants (dizzy, drowsy) refill on Tizanidine  4 mg for BID prn done today.

## 2023-12-19 NOTE — Assessment & Plan Note (Addendum)
 Reviewed x-ray from 11/27/23 from Dr. Acquanetta Acre office. Briefly discussed with Dr. Lara Plants through secure chat who is happy to follow up with patient on 12/25/23 as scheduled. Appreciate his expertise.  Recommend checking uric acid level.  Given patient's h/o Diabetes with previous use of Prednisone  causing elevated blood glucose without improvement in pain, deferred using steroid at this time.  Recommend taking Colchicine  0.6 mg twice a day (currently taking once a day), counseled patient on s/e including diarrhea. Also counseled her that she may need to be on colchicine  for up to 6 months then transition to Allopurinol.  - We discussed opioids group of medications is not really indicated for gout flare up treatment. Discussed [facility] be seen at [facility] if pain significant. Can try lidocaine  patch 5% daily to see if it helps with pain.  - Discussed signs of infection and recommend patient be seen in ED if so were to present.  - F/u with PCP in 3-4 weeks.

## 2023-12-19 NOTE — Progress Notes (Addendum)
 Acute Office Visit  Subjective:     Patient ID: Denise Sharp, female    DOB: 1946/07/15, 78 y.o.   MRN: [id number]  Chief Complaint  Patient presents with   Foot Pain    Left foot; gout; swelling and in a lot of pain; been dealing with it for about month but foot doctor wont give pain meds    Patient is in today for following acute concerns:  Pain left ankle, suspected from gout flare up. Pain of left foot since 09/2023. She was seen by podiatrist Dr. Lara Plants on the 11/27/23 where she was treated with [facility] and Marcaine  to the lateral aspect of left ankle where she had maximal pain. Patient reports she did not feel improvement in pain since injection. She is requesting pain medication today.   X-ray  done during office visit from 11/27/23 from Dr. Lara Plants reads the following: Radiographs taken today demonstrate considerable edema in the soft tissues. Left foot rear foot midfoot almost unrecognizable secondary to severe osteoarthritic changes and coalitions with severe calcification. Has an appointment with podiatry on 12/25/23. Patient is taking Colchicine  0.6 mg once a day since 11/26/23. She is also taking Tizanidine  4 mg nightly.    She describes pain to be stabbing, sharp in nature. She has a h/o neuropathy of b/l lower legs. Pain is constant in nature. She rated the pain to be 5/10 at rest and 9/10 when it's at it's worse.  She has been unable to put on weight on the left foot due to pain. She has a h/o neuropathy on b/l lower legs. Patient is looking for medication to help with pain today.   Patient reports she continues to have pain on left foot. She has noted improvement in swelling of lower leg with Torsemide . Has a h/o diabetic foot ulcer of left foot in the past. Has venous ultrasound of left lower extremity on 11/19/23 negative for DVT.  ROS As per HPI    Objective:    BP 118/80   Pulse 79   Temp 98.4 F (36.9 C)   Ht 5' 4.5" (1.638 m)   Wt 244 lb (110.7 kg)   SpO2 98%    BMI 41.24 kg/m    Physical Exam Constitutional:      Appearance: She is obese.  HENT:     Head: Normocephalic and atraumatic.  Cardiovascular:     Rate and Rhythm: Normal rate.     Pulses:          Dorsalis pedis pulses are 2+ on the right side and 2+ on the left side.       Posterior tibial pulses are 2+ on the left side.  Pulmonary:     Breath sounds: No wheezing.  Musculoskeletal:     Right lower leg: No edema.     Left lower leg: No lacerations or bony tenderness. 2+ Pitting Edema present.     Left foot: Prominent metatarsal heads present. No foot drop.     Comments: +warmth  Feet:     Left foot:     Protective Sensation:   0 sites sensed.     Skin integrity: No ulcer.     Toenail Condition: Left toenails are abnormally thick.     Comments: No open wound or ulcer noted. Neurological:     Mental Status: She is alert and oriented to person, place, and time.    No results found for any visits on 12/19/23.     Assessment & Plan:  Pain in left ankle and joints of left foot Assessment & Plan: Reviewed x-ray from 11/27/23 from Dr. Acquanetta Acre office. Briefly discussed with Dr. Lara Plants through secure chat who is happy to follow up with patient on 12/25/23 as scheduled. Appreciate his expertise.  Recommend checking uric acid level.  Given patient's h/o Diabetes with previous use of Prednisone  causing elevated blood glucose without improvement in pain, deferred using steroid at this time.  Recommend taking Colchicine  0.6 mg twice a day (currently taking once a day), counseled patient on s/e including diarrhea. Also counseled her that she may need to be on colchicine  for up to 6 months then transition to Allopurinol.  - We discussed opioids group of medications is not really indicated for gout flare up treatment. Discussed patient be seen at [facility] if pain significant. Can try lidocaine  patch 5% daily to see if it helps with pain.  - Discussed signs of infection and recommend  patient be seen in ED if so were to present.  - F/u with PCP in 3-4 weeks.    Orders: -     Colchicine ; Take 1 tablet (0.6 mg total) by mouth 2 (two) times daily.  Dispense: 60 tablet; Refill: 0 -     Lidocaine ; Place 1 patch onto the skin daily. Remove & Discard patch within 12 hours or as directed by MD  Dispense: 30 patch; Refill: 0 -     Uric acid  Foot cramps Assessment & Plan: Symptoms improved on prn Tizanidine  4mg , mostly taking at night. After counseling on s/e of muscle relaxants (dizzy, drowsy) refill on Tizanidine  4 mg for BID prn done today.     Orders: -     tiZANidine  HCl; Take 1 tablet (4 mg total) by mouth in the morning and at bedtime.  Dispense: 60 tablet; Refill: 0  Osteoarthritis of left foot, unspecified osteoarthritis type Assessment & Plan: Has significant OA of left foot involving multiple joint based on x-ray from 11/27/23. Given severity of OA may not get full resolution of pain, swelling of left foot. Recommend continuing f/u with podiatry.     I spent 35 minutes on the day of this face-to-face encounter reviewing the patient's medical history, medications, ongoing concerns with left foot, ankle pain, previous visit with her PCP, podiatry,  reviewing x-ray from her podiatry visit and reviewing the assessment and plan with the patient and her husband. This time also included counseling the patient on their health conditions and management options. Additionally, I spent time post-visit ordering and reviewing diagnostics and therapeutics with the patient.   Return for 2-3 weeks follow up with Dr. Madelon Scheuermann .  Jacklin Mascot, MD

## 2023-12-19 NOTE — Assessment & Plan Note (Signed)
 Has significant OA of left foot involving multiple joint based on x-ray from 11/27/23. Given severity of OA may not get full resolution of pain, swelling of left foot. Recommend continuing f/u with podiatry.

## 2023-12-19 NOTE — Patient Instructions (Addendum)
--   Take Tizanidine  4mg , twice a day as needed. Can cause feeling drowsy, sleepy.   -- I am changing your dose of Colchicine  0.6 mg twice a day for next 30 days till you follow up with your podiatrist and Dr. Madelon Scheuermann.   -- Lidocaine  patch every 12 hourly as needed for pain.   -- Please keep you appointment with Dr. Lara Plants and f/u with your PCP.

## 2023-12-20 ENCOUNTER — Ambulatory Visit: Payer: Self-pay

## 2023-12-20 LAB — URIC ACID: Uric Acid, Serum: 9.6 mg/dL — ABNORMAL HIGH (ref 2.4–7.0)

## 2023-12-23 ENCOUNTER — Ambulatory Visit (INDEPENDENT_AMBULATORY_CARE_PROVIDER_SITE_OTHER): Payer: Self-pay | Admitting: Urology

## 2023-12-23 VITALS — BP 111/66 | HR 89 | Ht 64.5 in | Wt 234.2 lb

## 2023-12-23 DIAGNOSIS — N3946 Mixed incontinence: Secondary | ICD-10-CM | POA: Diagnosis not present

## 2023-12-23 LAB — URINALYSIS, COMPLETE
Bilirubin, UA: NEGATIVE
Ketones, UA: NEGATIVE
Leukocytes,UA: NEGATIVE
Nitrite, UA: NEGATIVE
Protein,UA: NEGATIVE
RBC, UA: NEGATIVE
Specific Gravity, UA: 1.015 (ref 1.005–1.030)
Urobilinogen, Ur: 0.2 mg/dL (ref 0.2–1.0)
pH, UA: 6 (ref 5.0–7.5)

## 2023-12-23 LAB — MICROSCOPIC EXAMINATION: Epithelial Cells (non renal): >10 /HPF — AB (ref 0–10)

## 2023-12-23 NOTE — Progress Notes (Signed)
 12/23/2023 1:18 PM   Denise Sharp July 11, 1946 [id number]  Referring provider: Thersia Flax, MD [address] [place],  [place] [address]  Chief Complaint  Patient presents with   Urinary Incontinence    HPI: I placed an InterStim device September 18, 2021.  I had removed an old device.  The case took a long time and actually tried both sides to get finally good responses.  The lead was placed in the same position but in my opinion improved versus the initial InterStim lead.   The patient had presented with high-volume urge incontinence wearing 6-8 pads a day with flooding foot on the floor syndrome in the middle of the night.  She wanted an MRI compatible device as well.  She had been dry on the InterStim years back.   Little to no pain. Incisions look great.  She is completely continent and very happy     Excellent bladder control.  In the last 3-week she has had some left-sided pain and she wondered if it could be due to her InterStim.  Clinically not infected   Patient is urine looked infected.  She had a negative CT scan in December 2022.  She had a negative cystoscopy by Dr. Ace Holder in August 2022.  This is being done for blood in the urine.  She had a positive culture on that day.  She had to be given fosfomycin.   Patient might have a bladder infection.  Reassurance given about InterStim.  Call if culture positive.  See the patient in 1 year.  Called in ciprofloxacin  250 mg twice a day for 7 days   I saw the patient 1 year ago.  She has been seen multiple times by our nurse practitioner last time being October 2023.  Had a positive urine culture July 2023 when I saw her and since then had yeast in the urine once.  The InterStim procedure was in March 2023.  I reviewed the operative note and other notes prior.  It was a challenging procedure as noted.  She in the past has had uncontrolled diabetes.  She has been offered vaginal estrogen cream for recurrent UTIs    Patient was recently admitted in September 2024 for acute on chronic congestive heart failure.  She may have gained as much is 50 pounds in weight and has been switched from Lasix  to a stronger diuretic.  She has chronic renal insufficiency diabetes atrial fibrillation sleep apnea obesity and other cardiac issues. She wears 4-6 pads a day that can be very wet with urge incontinence especially when she goes from sitting to standing position.  She has not been changing the settings of the InterStim device.  She also wears pads for complete loss control of bowels with diarrhea and fecal incontinence.  She said her sugar control varies.  She does not think she has had bladder infections in the last 6 months.     I think we need to have reasonable treatment goals.  If this urine culture is positive I will put her on urinary prophylaxis at least for a while but otherwise treat positive cultures as needed.  I will have the patient see the [employer] representative as she is seeing our nurse practitioner a number of times.  I do not think replacing or changing InterStim is the answer based upon the previous operative experience and difficulties, patient's medical issues, and risk factors.  Botox would be an option but she would be at high  risk of retention.  Percutaneous tibial nerve stimulation an option.  I had initially thought years ago she was not a good candidate for refractory therapies but the InterStim was placed in [place] in 2015 and she actually did quite well.  It was reasonable to try Gemtesa  in the meantime   Patient is already lost 36 pounds in fluid.  She agreed with the above treatment plan.  I gave her the Gemtesa , sent urine for culture and will have the rep see her   Today I last saw the patient in September 2024.Gwynne Less physician assistant many times.  She had been seen by the nurse practitioner in 2023 several times.  I do not see where she saw the [employer] representative.  Culture in  September was negative   Does not reduce some of the urgency but only minimal benefit.  She can sit for 8 hours but since she stands up her feet hit the floor she has high-volume soaking incontinence.  She gained another 15 pounds recently he is going to the cardiac center today.  Hemoglobin A1c now is over 9 from 7.  She has to have her rings taken off due to fluid retention    Call if urine culture positive. Patient understands that it will be very difficult to control her incontinence with her fluid situation and diabetes and other medical comorbidities and she understood. I am not going to push therapy further. Reassess in 6 months   Today Frequency stable  Last c/s normal Computer issues Still lasix  60 mg and fluid shifts; gout; neuropathy; on eliquis  sugars better controlled post recent prensone Swollen left leg Still high volume UUI Spoke re PTNS and Botox      PMH: Past Medical History:  Diagnosis Date   (HFpEF) heart failure with preserved ejection fraction (HCC)    a. 12/2019 Echo: EF 65-70%, Gr2 DD. No significant valvular dzs.   Abnormality of gait 03/25/2013   Adrenal mass, left (HCC)    Anginal pain (HCC)    Anxiety    Aortic atherosclerosis (HCC)    Arthritis    Asthma    CAD (coronary artery disease) with h/o Atypical Chest Pain    a. 04/1986 Cath ([facility]): nl cors, EF 65%; b. 07/2012 Cath [facility]): Diff minor irregs; c. 07/2016 MV [facility]): "Equivocal"; d. 08/2016 Cardiac CT Ca2+ score [facility]): Ca2+ 1548; e. 05/2019 MV: No ischemia. EF 75%; f. 12/2019 PCI: LM nl, LAD 65ost (3.5x12 Resolute DES), 18m (2.75x12 Resolute DES),LCX/RCA nl; g. 08/2020 Cath: patent LAD stents, RCA 40ost, elev RH pressures, EF >65%.   Cervical spinal stenosis 1994   due to trauma to back ([employer] accident), has intermittent paralysis and parasthesias   Cervicogenic headache 03/23/2014   CKD (chronic kidney disease), stage III (HCC)    Depression    Diverticulosis    Dizziness    a.) chronic   DJD  (degenerative joint disease)    a. Chronic R shoulder pain   Dyspnea    Esophageal stenosis 09/2009   a.) transient outlet obstruction by food, cleared by EGD   Family history of adverse reaction to anesthesia    a.) daughter with (+) PONV   Gastric bypass status for obesity    GERD (gastroesophageal reflux disease)    Headache(784.0)    HLD (hyperlipidemia)    Hypertension    IBS (irritable bowel syndrome)    IDA (iron  deficiency anemia)    a.) post 2 unit txfsn 2009, normal endo/colonoscopy by [facility]  Left bundle branch block (LBBB)    a.) Intermittently present - likely rate related.   NSVT (nonsustained ventricular tachycardia) (HCC) 11/15/2020   a.) Holter study 11/15/2020; 11 episodes of NSVT lasting up to 8 beats with a maximum rate of 210 bpm; 14 atrial runs lasting up to 18 beats with a rate of up to 250 bpm.  Some atrial runs felt to be NSVT.   NSVT (nonsustained ventricular tachycardia) (HCC)    a. 10/2020 [facility]: 11 runs of NSVT up to 8 beats.   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 10/2020 [facility]: 14 atrial runs up to 18 beats, max HR 250.   Restless leg syndrome    Rotator cuff arthropathy, right 08/13/2017   Syncope and collapse 03/12/2014   Type II diabetes mellitus (HCC)     Surgical History: Past Surgical History:  Procedure Laterality Date   APPENDECTOMY     BACK SURGERY     CARDIOVERSION N/A 10/30/2022   Procedure: CARDIOVERSION;  Surgeon: Sammy Crisp, MD;  Location: [facility] ORS;  Service: Cardiovascular;  Laterality: N/A;   CHOLECYSTECTOMY N/A 1976   COLONOSCOPY     CORONARY ANGIOPLASTY     CORONARY STENT INTERVENTION N/A 01/04/2020   Procedure: CORONARY STENT INTERVENTION;  Surgeon: Wenona Hamilton, MD;  Location: [facility];  Service: Cardiovascular;  Laterality: N/A;  LAD    DIAPHRAGMATIC  HERNIA REPAIR  2015   ESOPHAGEAL DILATION     multiple   ESOPHAGOGASTRODUODENOSCOPY (EGD) WITH PROPOFOL  N/A 05/11/2021   Procedure: ESOPHAGOGASTRODUODENOSCOPY (EGD) WITH PROPOFOL ;  Surgeon: Marnee Sink, MD;  Location: [facility];  Service: Endoscopy;  Laterality: N/A;   ESOPHAGOGASTRODUODENOSCOPY (EGD) WITH PROPOFOL  N/A 06/13/2021   Procedure: ESOPHAGOGASTRODUODENOSCOPY (EGD) WITH PROPOFOL ;  Surgeon: Marnee Sink, MD;  Location: [facility];  Service: Endoscopy;  Laterality: N/A;   ESOPHAGOGASTRODUODENOSCOPY (EGD) WITH PROPOFOL  N/A 02/28/2023   Procedure: ESOPHAGOGASTRODUODENOSCOPY (EGD) WITH PROPOFOL ;  Surgeon: Marnee Sink, MD;  Location: [facility];  Service: Endoscopy;  Laterality: N/A;   EYE SURGERY     GASTRIC BYPASS  2000, 2005   Dr. Joanne Muckle IMPLANT PLACEMENT  10/2011   Saratha Cunas IMPLANT PLACEMENT N/A 09/18/2021   Procedure: Simona [place] IMPLANT FIRST STAGE;  Surgeon: Erman Hayward, MD;  Location: [facility];  Service: Urology;  Laterality: N/A;   INTERSTIM IMPLANT PLACEMENT N/A 09/18/2021   Procedure: Simona [place] IMPLANT SECOND STAGE WITH IMPEDENCE CHECK;  Surgeon: Erman Hayward, MD;  Location: [facility];  Service: Urology;  Laterality: N/A;   INTERSTIM IMPLANT REMOVAL N/A 09/18/2021   Procedure: REMOVAL OF INTERSTIM IMPLANT;  Surgeon: Erman Hayward, MD;  Location: [facility];  Service: Urology;  Laterality: N/A;   LEFT HEART CATH AND CORONARY ANGIOGRAPHY N/A 04/1986   LEFT HEART CATH AND CORONARY ANGIOGRAPHY Left 07/24/2012   LEFT HEART CATH AND CORS/GRAFTS ANGIOGRAPHY N/A 12/31/2019   Procedure: LEFT HEART CATH AND CORS/GRAFTS ANGIOGRAPHY;  Surgeon: Devorah Fonder, MD;  Location: [facility];  Service: Cardiovascular;  Laterality: N/A;   PANNICULECTOMY N/A 06/16/2019   Procedure: PANNICULECTOMY;  Surgeon: Barb Bonito, MD;  Location: [facility];  Service: Plastics;  Laterality: N/A;  3 hours, please   REVERSE SHOULDER ARTHROPLASTY Right  08/13/2017   Procedure: REVERSE RIGHT SHOULDER ARTHROPLASTY;  Surgeon: Osa Blase, MD;  Location: [facility];  Service: Orthopedics;  Laterality: Right;   RIGHT/LEFT HEART CATH AND CORONARY ANGIOGRAPHY N/A 09/06/2020   Procedure: RIGHT/LEFT HEART CATH AND CORONARY ANGIOGRAPHY;  Surgeon: Sammy Crisp, MD;  Location: [facility];  Service: Cardiovascular;  Laterality: N/A;   RIGHT/LEFT HEART CATH AND CORONARY ANGIOGRAPHY N/A 06/04/2022   Procedure: RIGHT/LEFT HEART CATH AND CORONARY ANGIOGRAPHY;  Surgeon: Sammy Crisp, MD;  Location: [facility];  Service: Cardiovascular;  Laterality: N/A;   ROTATOR CUFF REPAIR Right    SPINE SURGERY  1995   [place]   TOOTH EXTRACTION  11/13/2021   TOTAL ABDOMINAL HYSTERECTOMY W/ BILATERAL SALPINGOOPHORECTOMY  1974   TOTAL KNEE ARTHROPLASTY Bilateral 2007   Procedure: TOTAL KNEE ARTHROPLASTY; Surgeons: Glinda Lapping, MD and [name], MD   UMBILICAL HERNIA REPAIR  08/11/2015    Home Medications:  Allergies as of 12/23/2023       Reactions   Demeclocycline Hives   Erythromycin Nausea And Vomiting, Other (See Comments)   Severe irritable bowel   Flagyl [metronidazole] Nausea And Vomiting, Other (See Comments)   Severe irritable bowel   Glucophage [metformin Hcl] Nausea And Vomiting, Other (See Comments)   "Sick" "I won't take anything that has metformin in it"   Tetracyclines & Related Hives, Rash   Potassium Chloride  Other (See Comments)   Cannot swallow potssium pills, able to tolerate liquid potassium   Tetracycline Other (See Comments)   Diovan [valsartan] Nausea Only       Sulfa  Antibiotics Other (See Comments), Rash   As child   Xanax [alprazolam] Other (See Comments)   Hyperactivity         Medication List        Accurate as of December 23, 2023  1:18 PM. If you have any questions, ask your nurse or doctor.          apixaban  5 MG Tabs tablet Commonly known as: ELIQUIS  Take 1 tablet (5 mg total) by mouth 2 (two) times  daily.   atorvastatin  80 MG tablet Commonly known as: LIPITOR  Take 1 tablet (80 mg total) by mouth daily.   budesonide -formoterol  160-4.5 MCG/ACT inhaler Commonly known as: SYMBICORT  Inhale 2 puffs into the lungs 2 (two) times daily.   butalbital -acetaminophen -caffeine  50-325-40 MG tablet Commonly known as: FIORICET  TAKE 1 TABLET BY MOUTH EVERY 6 HOURS AS NEEDED FOR HEADACHE.   clotrimazole -betamethasone  cream Commonly known as: LOTRISONE  Apply 1 application topically 2 (two) times daily as needed (irritation).   colchicine  0.6 MG tablet Take 1 tablet (0.6 mg total) by mouth 2 (two) times daily.   cyanocobalamin  1000 MCG/ML injection Commonly known as: VITAMIN B12 Inject 1 mL (1,000 mcg total) into the muscle every 30 (thirty) days. Inject 1 ml (1000 mcg ) IM weekly x 4,  Then monthly thereafter   dapagliflozin  propanediol 10 MG Tabs tablet Commonly known as: FARXIGA  Take 1 tablet (10 mg total) by mouth daily.   dicyclomine  20 MG tablet Commonly known as: BENTYL  Take 1 tablet (20 mg total) by mouth 3 (three) times daily before meals.   Droplet Pen Needles 31G X 5 MM Misc Generic drug: Insulin  Pen Needle USE ONE NEEDLE SUBCUTANEOUSLY AS DIRECTED (REMOVE AND DISCARD NEEDLE IN SHARPS CONTAINER IMMEDIATELY AFTER USE)   DULoxetine  60 MG capsule Commonly known as: Cymbalta  Take 1 capsule (60 mg total) by mouth daily.   ezetimibe  10 MG tablet Commonly known as: ZETIA  Take 1 tablet (10 mg total) by mouth daily.   fexofenadine 180 MG tablet Commonly known as: ALLEGRA Take 180 mg by mouth  as needed for allergies or rhinitis.   fluticasone  50 MCG/ACT nasal spray Commonly known as: FLONASE  Place 2 sprays into both nostrils daily.   FreeStyle Libre 2 Reader Seymour Dapper Use to check sugar at least TID   FreeStyle Libre 3 Plus Sensor Misc Change sensor every 15 days.   Gvoke HypoPen  2-Pack 0.5 MG/0.1ML Soaj Generic drug: Glucagon  Inject 1 auto injector subcutaneously as needed  for severe hypoglycemia. May repeat x 1 after 15 minutes if needed.   hydrocortisone  25 MG suppository Commonly known as: ANUSOL -HC Place 1 suppository (25 mg total) rectally 2 (two) times daily.   Kerendia  10 MG Tabs Generic drug: Finerenone  Take 1 tablet (10 mg total) by mouth in the morning.   lidocaine  5 % Commonly known as: Lidoderm  Place 1 patch onto the skin daily. Remove & Discard patch within 12 hours or as directed by MD   lipase/protease/amylase 95638 UNITS Cpep capsule Commonly known as: Creon  Take 2 capsules (72,000 Units total) by mouth 3 (three) times daily before meals AND 1 capsule (36,000 Units total) with snacks.   loperamide  2 MG capsule Commonly known as: IMODIUM  Take 4-6 mg by mouth 3 (three) times daily before meals.   mometasone  0.1 % cream Commonly known as: ELOCON  Apply topically.   mupirocin  cream 2 % Commonly known as: BACTROBAN  Apply topically.   nitroGLYCERIN  0.4 MG SL tablet Commonly known as: NITROSTAT  Place 1 tablet (0.4 mg total) under the tongue every 5 (five) minutes as needed for chest pain.   NovoLOG  FlexPen 100 UNIT/ML FlexPen Generic drug: insulin  aspart Inject 10-16 Units into the skin 3 (three) times daily with meals.   nystatin  powder Commonly known as: MYCOSTATIN /NYSTOP  Apply 1 g topically 4 (four) times daily as needed (rash).   ondansetron  4 MG disintegrating tablet Commonly known as: ZOFRAN -ODT Take 1 tablet (4 mg total) by mouth every 8 (eight) hours as needed for nausea or vomiting.   OneTouch Delica Lancets 33G Misc Used to check blood sugar two times a day.   OneTouch Verio test strip Generic drug: glucose blood Use to check blood sugar twice daily.   OVER THE COUNTER MEDICATION Apply 1 application  topically daily as needed (pain). [place] Gel topical pain reliever   pantoprazole  40 MG tablet Commonly known as: PROTONIX  Take 1 tablet (40 mg total) by mouth daily.   potassium chloride  20 MEQ/15ML (10%)  Soln Take 15 mLs (20 mEq total) by mouth daily.   pregabalin  50 MG capsule Commonly known as: LYRICA  Take 1 capsule (50 mg total) by mouth 2 (two) times daily.   Premarin  vaginal cream Generic drug: conjugated estrogens  Apply one pea-sized amount around the opening of the urethra daily for 2 weeks, then 3 times weekly moving forward.   SYSTANE OP Place 1 drop into both eyes 4 (four) times daily as needed (dry eyes).   tiZANidine  4 MG tablet Commonly known as: ZANAFLEX  Take 1 tablet (4 mg total) by mouth in the morning and at bedtime.   torsemide  20 MG tablet Commonly known as: DEMADEX  Take 3 tablets (60 mg total) by mouth daily.   Tresiba  FlexTouch 100 UNIT/ML FlexTouch Pen Generic drug: insulin  degludec Inject 30 Units into the skin daily.   Vitamin D3 250 MCG (10000 UT) capsule Take 10,000 Units by mouth daily.        Allergies:  Allergies  Allergen Reactions   Demeclocycline Hives   Erythromycin Nausea And Vomiting and Other (See Comments)    Severe irritable bowel  Flagyl [Metronidazole] Nausea And Vomiting and Other (See Comments)    Severe irritable bowel   Glucophage [Metformin Hcl] Nausea And Vomiting and Other (See Comments)    "Sick" "I won't take anything that has metformin in it"   Tetracyclines & Related Hives and Rash   Potassium Chloride  Other (See Comments)    Cannot swallow potssium pills, able to tolerate liquid potassium   Tetracycline Other (See Comments)   Diovan [Valsartan] Nausea Only        Sulfa  Antibiotics Other (See Comments) and Rash    As child   Xanax [Alprazolam] Other (See Comments)    Hyperactivity     Family History: Family History  Problem Relation Age of Onset   Heart disease Father    Hypertension Father    Prostate cancer Father    Stroke Father    Osteoporosis Father    Stroke Mother    Depression Mother    Headache Mother    Heart disease Mother    Thyroid  disease Mother    Hypertension Mother    Diabetes  Daughter    Heart disease Daughter    Hypertension Daughter    Hypertension Son     Social History:  reports that she has never smoked. She has never been exposed to tobacco smoke. She has never used smokeless tobacco. She reports that she does not currently use drugs. She reports that she does not drink alcohol .  ROS:                                        Physical Exam: BP 111/66   Pulse 89   Ht 5' 4.5" (1.638 m)   Wt 106.3 kg   BMI 39.59 kg/m   Constitutional:  Alert and oriented, No acute distress. HEENT:  AT, moist mucus membranes.  Trachea midline, no masses.   Laboratory Data: Lab Results  Component Value Date   WBC 11.5 (H) 11/28/2023   HGB 11.2 (L) 11/28/2023   HCT 36.1 11/28/2023   MCV 91.2 11/28/2023   PLT 396 11/28/2023    Lab Results  Component Value Date   CREATININE 1.32 (H) 11/28/2023    No results found for: "PSA"  No results found for: "TESTOSTERONE"  Lab Results  Component Value Date   HGBA1C 9.3 (A) 09/30/2023    Urinalysis    Component Value Date/Time   COLORURINE YELLOW 02/17/2021 1008   APPEARANCEUR Hazy (A) 06/24/2023 1359   LABSPEC >=1.030 (A) 02/17/2021 1008   LABSPEC 1.006 06/21/2014 1623   PHURINE 5.0 02/17/2021 1008   GLUCOSEU 3+ (A) 06/24/2023 1359   GLUCOSEU 500 (A) 02/17/2021 1008   HGBUR NEGATIVE 02/17/2021 1008   BILIRUBINUR Negative 06/24/2023 1359   BILIRUBINUR Negative 06/21/2014 1623   KETONESUR NEGATIVE 02/17/2021 1008   PROTEINUR Negative 06/24/2023 1359   PROTEINUR NEGATIVE 02/08/2021 1323   UROBILINOGEN 0.2 02/17/2021 1008   NITRITE Negative 06/24/2023 1359   NITRITE NEGATIVE 02/17/2021 1008   LEUKOCYTESUR Negative 06/24/2023 1359   LEUKOCYTESUR SMALL (A) 02/17/2021 1008   LEUKOCYTESUR Negative 06/21/2014 1623    Pertinent Imaging: Send urine for c/s and call if positive  Assessment & Plan:  PTNS started; botox not ideal on blood thinner; will not be offering repeat SNM   1.  Mixed incontinence (Primary)  - Urinalysis, Complete   No follow-ups on file.  Devorah Fonder, MD  [facility]  [address] [place], [place] [address] [phone]

## 2023-12-23 NOTE — Patient Instructions (Signed)
Urgent PC office-based treatment for overactive bladder Take back control of your life! Urgent PC is a non-drug, non-surgical option for overactive bladder and associated symptoms of urinary urgency, urinary frequency and urge incontinence  Urgent-PC- Since 2003, healthcare professionals have used the Urgent PC Neuromodulation System as an effective office treatment for men and women suffering from overactive bladder, a condition commonly referred to as OAB. Urgent PC is up to 80% effective, even after conservative measures and OAB drugs have failed. Plus, Urgent PC is very low risk, making it a great choice for people unable or unwilling to have more invasive procedures.  How does Urgent PC work?  The Urgent PC system delivers a specific type of neuromodulation called percutaneous tibial nerve stimulation (PTNS). During treatment, a small, slim needle electrode is inserted near your ankle. The needle electrode is then connected to the battery-powered stimulator. During your 30-minute treatment, mild impulses from the stimulator travel through the needle electrode, along your leg and to the nerves in your pelvis that control bladder function. This process is also referred to as neuromodulation.  What will I feel with Urgent PC therapy?  Because patients may experience the sensation of the Urgent PC therapy in different ways, it's difficult to say what the treatment would feel like to you. Patients often describe the sensation as "tingling" or "pulsating." Treatment is typically well-tolerated by patients. Urgent PC offers many different levels of stimulation, so your clinician will be able to adjust treatment to suit you as well as address any discomfort that you might experience during treatment.  How often will I need Urgent PC treatments? You will receive an initial series of 12 treatments scheduled about a week apart. If you respond, you will likely need a treatment about once per month to  maintain your improvements.  How soon will I see results with Urgent PC? Because Urgent PC gently modifies the signals to achieve bladder control, it usually takes 5-7 weeks for symptoms to change. However, patients respond at different rates. In a review of about 100 patients who had success with Urgent PC, symptoms improved anywhere between 2-12 weeks. For about 20% of these patients, the symptoms of urgency and/or urge incontinence didn't improve until after 8 weeks.1  There is no way to anticipate who will respond earlier, later or not at all. That's why it is important to receive the 12 recommended treatments before you and your physician evaluate whether this therapy is an appropriate and effective choice for you.  How can I receive treatment with Urgent PC? Urgent PC is an option for patients with OAB. If you think you have OAB, talk to your doctor, a urologist or urogynecologist. If you have OAB, the doctor will work with you to determine your own personal treatment plan which usually starts with behavior and diet modifications plus medications. Urgent PC is an excellent option if these options don't work or provide sufficient improvements. Treatment with Urgent PC is typically performed at the office of a urologist, urogynecologist or gynecologist. So, if you run out of options with your normal doctor, consider visiting one of these specialists.  Does insurance cover Urgent PC? Urgent PC treatment is reimbursed by Medicare across the [place]. Private insurance coverage varies by state. To see if your insurance company covers Urgent PC, use our Coverage Finder or talk to your Healthcare Provider.  Are there patients who should not be treated with Urgent PC? Yes, these include: patients with pacemakers or implantable defibrillators, patients prone   to excessive bleeding, patients with nerve damage that could impact either percutaneous tibial nerve or pelvic floor function and patients who  are pregnant or planning to become pregnant during the duration of the treatment.  What are the risks associated with Urgent PC? The risks associated with Urgent PC therapy are low. Most common side-effects are temporary and include mild pain or skin inflammation at or near the stimulation site. 

## 2023-12-25 ENCOUNTER — Encounter: Payer: Self-pay | Admitting: Podiatry

## 2023-12-25 ENCOUNTER — Ambulatory Visit (INDEPENDENT_AMBULATORY_CARE_PROVIDER_SITE_OTHER): Admitting: Podiatry

## 2023-12-25 DIAGNOSIS — E1149 Type 2 diabetes mellitus with other diabetic neurological complication: Secondary | ICD-10-CM | POA: Diagnosis not present

## 2023-12-25 DIAGNOSIS — E11610 Type 2 diabetes mellitus with diabetic neuropathic arthropathy: Secondary | ICD-10-CM | POA: Diagnosis not present

## 2023-12-25 DIAGNOSIS — M2142 Flat foot [pes planus] (acquired), left foot: Secondary | ICD-10-CM | POA: Diagnosis not present

## 2023-12-25 NOTE — Progress Notes (Signed)
 She presents today with her husband for follow-up of her left foot pain.  She states that it is a whole lot worse now than it was 3 weeks ago when I saw her.  She states that the foot is swelling even more has no pain in the calf but the foot is absolutely killing her she states that is becoming depressing and is driving her insane.  Her primary provider had increased her colchicine  to 2 tablets a day.  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  This is exquisitely painful for her.  Assessment: Collapse of the rear foot.  Foot deformity left.  Chronic pain.  She needs cam boot given to you.  Plan: Discussed etiology pathology conservative therapy at this point requesting an MRI we will be place her in a cam boot for stability.  Once the MRI is complete I do think a Unna boot with compression would be necessary.  MRI is for rear foot collapse surgical consideration as well as differential diagnoses.

## 2023-12-26 LAB — CULTURE, URINE COMPREHENSIVE

## 2023-12-31 ENCOUNTER — Ambulatory Visit: Attending: Medical | Admitting: Medical

## 2023-12-31 ENCOUNTER — Encounter: Payer: Self-pay | Admitting: Medical

## 2023-12-31 VITALS — BP 106/62 | HR 83 | Ht 64.0 in | Wt 240.0 lb

## 2023-12-31 DIAGNOSIS — G4733 Obstructive sleep apnea (adult) (pediatric): Secondary | ICD-10-CM

## 2023-12-31 DIAGNOSIS — I5032 Chronic diastolic (congestive) heart failure: Secondary | ICD-10-CM | POA: Diagnosis present

## 2023-12-31 DIAGNOSIS — I480 Paroxysmal atrial fibrillation: Secondary | ICD-10-CM

## 2023-12-31 DIAGNOSIS — I959 Hypotension, unspecified: Secondary | ICD-10-CM

## 2023-12-31 DIAGNOSIS — N183 Chronic kidney disease, stage 3 unspecified: Secondary | ICD-10-CM

## 2023-12-31 DIAGNOSIS — E1122 Type 2 diabetes mellitus with diabetic chronic kidney disease: Secondary | ICD-10-CM

## 2023-12-31 DIAGNOSIS — E1169 Type 2 diabetes mellitus with other specified complication: Secondary | ICD-10-CM

## 2023-12-31 DIAGNOSIS — I428 Other cardiomyopathies: Secondary | ICD-10-CM | POA: Diagnosis not present

## 2023-12-31 DIAGNOSIS — I25758 Atherosclerosis of native coronary artery of transplanted heart with other forms of angina pectoris: Secondary | ICD-10-CM

## 2023-12-31 NOTE — Patient Instructions (Addendum)
 Medication Instructions:  Your physician recommends that you continue on your current medications as directed. Please refer to the Current Medication list given to you today.    *If you need a refill on your cardiac medications before your next appointment, please call your pharmacy*  Lab Work: No labs ordered today .  Testing/Procedures: No test ordered today   Follow-Up: At [facility], you and your health needs are our priority.  As part of our continuing mission to provide you with exceptional heart care, our providers are all part of one team.  This team includes your primary Cardiologist (physician) and Advanced Practice Providers or APPs (Physician Assistants and Nurse Practitioners) who all work together to provide you with the care you need, when you need it.  Your next appointment:   5 month(s)  Provider:   Sammy Crisp, MD or Cadence Stana Ear    Your physician recommends that you schedule a follow-up appointment in 1 month with Dr. Bruce Caper

## 2023-12-31 NOTE — Progress Notes (Unsigned)
 Cardiology Office Note   Date:  12/31/2023  ID:  Denise Sharp, DOB 12/28/1945, MRN [id number] PCP: Denise Flax, MD  [facility] Providers Cardiologist:  Sammy Crisp, MD   History of Present Illness Denise Sharp is a 78 y.o. female with a hx of HFpEF, CAD s/p PCI to the LAD, persistent Afib, DM2, CKD, OSA on CPAP and morbid obesity who presents for follow-up for CAD and CHF.    The patient was admitted in September 2024 due to 30 pound weight gain requiring IV diuresis at that time she was taking Lasix  20 mg once daily.  She was admitted to [facility] where echo showed preserved EF with grade 2 diastolic dysfunction.  She was diuresed 4 L and transition to torsemide  20 mg daily.  Throughout admission she maintained normal sinus rhythm.  She did have some rectal bleeding that was secondary to hemorrhoids.    Patient has been followed by advanced heart failure team.  She was seen May 24, 2023 reporting stable weights 241 to 243 pounds.  She was wearing ZOLL heart failure monitoring device.  She was taking torsemide  60 mg most days.she last saw CHF team 07/01/23 and finerenone  was refilled. She was on torsemide  BID, and this was decreased to 60mg  daily.  She last saw advanced heart failure team 11/26/23 and was started on colchicine  for gout. Spiro was stopped as patient was on finerenone .   Today, BP is low. She feels dizzy and lightheaded, but this is also common. She reports BP is also low. She was treated for gout. She has a boot on her left foot for possible broken foot. MRI foot was scheduled. She will under eltectro therapy on the right leg to help bladder. Her husband fell and can't get scooter around the [place] so function is very limited. She has occasional heart fluttering. She reports chest pain and SOB improved with SL Ntg. Says BP was low at these times. She feels chest pain is from stress. She cut torsmeide back down to 60mg  daily.   Studies Reviewed EKG  Interpretation Date/Time:  Tuesday December 31 2023 15:12:27 EDT Ventricular Rate:  83 PR Interval:  156 QRS Duration:  60 QT Interval:  378 QTC Calculation: 444 R Axis:   10  Text Interpretation: Normal sinus rhythm Low voltage QRS When compared with ECG of 21-Oct-2023 13:55, Nonspecific T wave abnormality now evident in Lateral leads Confirmed by Denise Sharp, Denise Sharp ([id number]) on 12/31/2023 3:28:36 PM    *** Risk Assessment/Calculations {Does this patient have ATRIAL FIBRILLATION?[phone]}         Physical Exam VS:  BP 106/62   Pulse 83   Ht 5' 4 (1.626 m)   Wt 240 lb (108.9 kg)   SpO2 98%   BMI 41.20 kg/m    Wt Readings from Last 3 Encounters:  12/31/23 240 lb (108.9 kg)  12/23/23 234 lb 4 oz (106.3 kg)  12/19/23 244 lb (110.7 kg)    GEN: Well nourished, well developed in no acute distress NECK: No JVD; No carotid bruits CARDIAC: RRR, no murmurs, rubs, gallops RESPIRATORY:  Clear to auscultation without rales, wheezing or rhonchi  ABDOMEN: Soft, non-tender, non-distended EXTREMITIES:  No edema; No deformity   ASSESSMENT AND PLAN  NICM - Cardiomyopathy suspected from obesity, Afib, vascular disease, HTN and CKD - Echo 08/2017 LVEF 65-70%, G2DD, mildly elevated PA pressure of 33 mHG - Echo 12/2019 LVEF 65-70%, G2DD - Echo 03/2023 LVEF 60-65%, mild LVH, G1DD - the patient is  euvolemic on exam - BP is low, but second one improved - continue Farxiga  10mg  daily, Finerenone  10mg  daily - she is down to taking Torsemide  60mg  daily - we will continue current medications and have [facility].   Hypotension HTN - initial BP is low, repeat 106/62 - she reports h/o low BP - she has unchanged lightheadedness and dizziness - can reevaluate BP at follow-up  Paroxysmal Afib - EKG shows NSR - continue Eliquis  5mg  BID  CAD - recent Cardiac PET stress test was normal with no ischemia or prior infarction - she reports chest pain that she feels is from stress  OSA -  she is using CPAP nightly  DM2 - A1C 9.3 - she follows with endocrinology  CKD stage 3 - most recent Scr 1.32  Gout - she is taking colchicine  daily  Possible left foot fracture - she is currently wearing boot on the left foot with plan for MRI in the next few months       Dispo: Follow-up with heart failure in1 1 month and general cards 5 months  Signed, Denise Bishara Rebekah Canada, PA-C

## 2024-01-01 ENCOUNTER — Telehealth: Payer: Self-pay | Admitting: Podiatry

## 2024-01-01 NOTE — Addendum Note (Signed)
 Addended by: Josephina Nicks on: 01/01/2024 11:57 AM   Modules accepted: Orders

## 2024-01-01 NOTE — Telephone Encounter (Signed)
 Requesting to have MRI referral sent to [facility]

## 2024-01-07 ENCOUNTER — Ambulatory Visit (INDEPENDENT_AMBULATORY_CARE_PROVIDER_SITE_OTHER): Admitting: Nurse Practitioner

## 2024-01-07 ENCOUNTER — Encounter: Payer: Self-pay | Admitting: Nurse Practitioner

## 2024-01-07 VITALS — BP 110/66 | HR 82 | Ht 64.0 in | Wt 248.0 lb

## 2024-01-07 DIAGNOSIS — N3281 Overactive bladder: Secondary | ICD-10-CM | POA: Diagnosis not present

## 2024-01-07 DIAGNOSIS — I509 Heart failure, unspecified: Secondary | ICD-10-CM | POA: Diagnosis not present

## 2024-01-07 DIAGNOSIS — D649 Anemia, unspecified: Secondary | ICD-10-CM | POA: Diagnosis not present

## 2024-01-07 DIAGNOSIS — Z794 Long term (current) use of insulin: Secondary | ICD-10-CM

## 2024-01-07 DIAGNOSIS — E1142 Type 2 diabetes mellitus with diabetic polyneuropathy: Secondary | ICD-10-CM | POA: Diagnosis not present

## 2024-01-07 DIAGNOSIS — R809 Proteinuria, unspecified: Secondary | ICD-10-CM | POA: Diagnosis not present

## 2024-01-07 DIAGNOSIS — E785 Hyperlipidemia, unspecified: Secondary | ICD-10-CM | POA: Diagnosis not present

## 2024-01-07 DIAGNOSIS — E1165 Type 2 diabetes mellitus with hyperglycemia: Secondary | ICD-10-CM | POA: Diagnosis not present

## 2024-01-07 DIAGNOSIS — E782 Mixed hyperlipidemia: Secondary | ICD-10-CM | POA: Diagnosis not present

## 2024-01-07 DIAGNOSIS — G4733 Obstructive sleep apnea (adult) (pediatric): Secondary | ICD-10-CM | POA: Diagnosis not present

## 2024-01-07 DIAGNOSIS — E559 Vitamin D deficiency, unspecified: Secondary | ICD-10-CM | POA: Diagnosis not present

## 2024-01-07 DIAGNOSIS — D508 Other iron deficiency anemias: Secondary | ICD-10-CM | POA: Diagnosis not present

## 2024-01-07 DIAGNOSIS — E1122 Type 2 diabetes mellitus with diabetic chronic kidney disease: Secondary | ICD-10-CM | POA: Diagnosis not present

## 2024-01-07 DIAGNOSIS — N1832 Chronic kidney disease, stage 3b: Secondary | ICD-10-CM | POA: Diagnosis not present

## 2024-01-07 DIAGNOSIS — I10 Essential (primary) hypertension: Secondary | ICD-10-CM

## 2024-01-07 DIAGNOSIS — R3914 Feeling of incomplete bladder emptying: Secondary | ICD-10-CM | POA: Diagnosis not present

## 2024-01-07 DIAGNOSIS — Z9884 Bariatric surgery status: Secondary | ICD-10-CM | POA: Diagnosis not present

## 2024-01-07 DIAGNOSIS — R609 Edema, unspecified: Secondary | ICD-10-CM | POA: Diagnosis not present

## 2024-01-07 LAB — POCT GLYCOSYLATED HEMOGLOBIN (HGB A1C): Hemoglobin A1C: 7.6 % — AB (ref 4.0–5.6)

## 2024-01-07 MED ORDER — DAPAGLIFLOZIN PROPANEDIOL 10 MG PO TABS: 10.0000 mg | ORAL_TABLET | Freq: Every day | ORAL | 3 refills | Status: DC

## 2024-01-07 MED ORDER — FREESTYLE LIBRE 3 PLUS SENSOR MISC: 3 refills | Status: DC

## 2024-01-07 MED ORDER — NOVOLOG FLEXPEN 100 UNIT/ML ~~LOC~~ SOPN: 8.0000 [IU] | PEN_INJECTOR | Freq: Three times a day (TID) | SUBCUTANEOUS | 3 refills | Status: DC

## 2024-01-07 MED ORDER — TRESIBA FLEXTOUCH 100 UNIT/ML ~~LOC~~ SOPN
15.0000 [IU] | PEN_INJECTOR | Freq: Every day | SUBCUTANEOUS | 3 refills | Status: DC
Start: 2024-01-07 — End: 2024-02-03

## 2024-01-07 NOTE — Progress Notes (Signed)
 Endocrinology Follow Up Note       01/07/2024, 2:53 PM   Subjective:    Patient ID: Denise Sharp, female    DOB: July 06, 1946.  Denise Sharp is being seen in follow up after being seen in consultation for management of currently uncontrolled symptomatic diabetes requested by  Marylynn Verneita CROME, MD.   Past Medical History:  Diagnosis Date   (HFpEF) heart failure with preserved ejection fraction (HCC)    a. 12/2019 Echo: EF 65-70%, Gr2 DD. No significant valvular dzs.   Abnormality of gait 03/25/2013   Adrenal mass, left (HCC)    Anginal pain (HCC)    Anxiety    Aortic atherosclerosis (HCC)    Arthritis    Asthma    CAD (coronary artery disease) with h/o Atypical Chest Pain    a. 04/1986 Cath ([facility]): nl cors, EF 65%; b. 07/2012 Cath [facility]): Diff minor irregs; c. 07/2016 MV [facility]): Equivocal; d. 08/2016 Cardiac CT Ca2+ score [facility]): Ca2+ 1548; e. 05/2019 MV: No ischemia. EF 75%; f. 12/2019 PCI: LM nl, LAD 65ost (3.5x12 Resolute DES), 41m (2.75x12 Resolute DES),LCX/RCA nl; g. 08/2020 Cath: patent LAD stents, RCA 40ost, elev RH pressures, EF >65%.   Cervical spinal stenosis 1994   due to trauma to back ([employer] accident), has intermittent paralysis and parasthesias   Cervicogenic headache 03/23/2014   CKD (chronic kidney disease), stage III (HCC)    Depression    Diverticulosis    Dizziness    a.) chronic   DJD (degenerative joint disease)    a. Chronic R shoulder pain   Dyspnea    Esophageal stenosis 09/2009   a.) transient outlet obstruction by food, cleared by EGD   Family history of adverse reaction to anesthesia    a.) daughter with (+) PONV   Gastric bypass status for obesity    GERD (gastroesophageal reflux disease)    Headache(784.0)    HLD (hyperlipidemia)    Hypertension    IBS (irritable bowel syndrome)    IDA (iron  deficiency anemia)    a.) post 2 unit txfsn 2009, normal endo/colonoscopy by  [facility]   Left bundle branch block (LBBB)    a.) Intermittently present - likely rate related.   NSVT (nonsustained ventricular tachycardia) (HCC) 11/15/2020   a.) Holter study 11/15/2020; 11 episodes of NSVT lasting up to 8 beats with a maximum rate of 210 bpm; 14 atrial runs lasting up to 18 beats with a rate of up to 250 bpm.  Some atrial runs felt to be NSVT.   NSVT (nonsustained ventricular tachycardia) (HCC)    a. 10/2020 [facility]: 11 runs of NSVT up to 8 beats.   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 10/2020 [facility]: 14 atrial runs up to 18 beats, max HR 250.   Restless leg syndrome    Rotator cuff arthropathy, right 08/13/2017   Syncope and collapse 03/12/2014   Type II diabetes mellitus (HCC)     Past Surgical History:  Procedure Laterality Date   APPENDECTOMY  BACK SURGERY     CARDIOVERSION N/A 10/30/2022   Procedure: CARDIOVERSION;  Surgeon: Mady Bruckner, MD;  Location: [facility];  Service: Cardiovascular;  Laterality: N/A;   CHOLECYSTECTOMY N/A 1976   COLONOSCOPY     CORONARY ANGIOPLASTY     CORONARY STENT INTERVENTION N/A 01/04/2020   Procedure: CORONARY STENT INTERVENTION;  Surgeon: Darron Deatrice LABOR, MD;  Location: [facility];  Service: Cardiovascular;  Laterality: N/A;  LAD    DIAPHRAGMATIC HERNIA REPAIR  2015   ESOPHAGEAL DILATION     multiple   ESOPHAGOGASTRODUODENOSCOPY (EGD) WITH PROPOFOL  N/A 05/11/2021   Procedure: ESOPHAGOGASTRODUODENOSCOPY (EGD) WITH PROPOFOL ;  Surgeon: Jinny Carmine, MD;  Location: [facility];  Service: Endoscopy;  Laterality: N/A;   ESOPHAGOGASTRODUODENOSCOPY (EGD) WITH PROPOFOL  N/A 06/13/2021   Procedure: ESOPHAGOGASTRODUODENOSCOPY (EGD) WITH PROPOFOL ;  Surgeon: Jinny Carmine, MD;  Location: [facility];  Service: Endoscopy;  Laterality: N/A;   ESOPHAGOGASTRODUODENOSCOPY (EGD)  WITH PROPOFOL  N/A 02/28/2023   Procedure: ESOPHAGOGASTRODUODENOSCOPY (EGD) WITH PROPOFOL ;  Surgeon: Jinny Carmine, MD;  Location: [facility];  Service: Endoscopy;  Laterality: N/A;   EYE SURGERY     GASTRIC BYPASS  2000, 2005   Dr. Mia REDBIRD IMPLANT PLACEMENT  10/2011   Ike REDBIRD IMPLANT PLACEMENT N/A 09/18/2021   Procedure: REDBIRD IMPLANT FIRST STAGE;  Surgeon: Gaston Hamilton, MD;  Location: [facility];  Service: Urology;  Laterality: N/A;   INTERSTIM IMPLANT PLACEMENT N/A 09/18/2021   Procedure: REDBIRD IMPLANT SECOND STAGE WITH IMPEDENCE CHECK;  Surgeon: Gaston Hamilton, MD;  Location: [facility];  Service: Urology;  Laterality: N/A;   INTERSTIM IMPLANT REMOVAL N/A 09/18/2021   Procedure: REMOVAL OF INTERSTIM IMPLANT;  Surgeon: Gaston Hamilton, MD;  Location: [facility];  Service: Urology;  Laterality: N/A;   LEFT HEART CATH AND CORONARY ANGIOGRAPHY N/A 04/1986   LEFT HEART CATH AND CORONARY ANGIOGRAPHY Left 07/24/2012   LEFT HEART CATH AND CORS/GRAFTS ANGIOGRAPHY N/A 12/31/2019   Procedure: LEFT HEART CATH AND CORS/GRAFTS ANGIOGRAPHY;  Surgeon: Perla Evalene PARAS, MD;  Location: [facility];  Service: Cardiovascular;  Laterality: N/A;   PANNICULECTOMY N/A 06/16/2019   Procedure: PANNICULECTOMY;  Surgeon: Elisabeth Craig RAMAN, MD;  Location: [facility];  Service: Plastics;  Laterality: N/A;  3 hours, please   REVERSE SHOULDER ARTHROPLASTY Right 08/13/2017   Procedure: REVERSE RIGHT SHOULDER ARTHROPLASTY;  Surgeon: Josefina Chew, MD;  Location: [facility];  Service: Orthopedics;  Laterality: Right;   RIGHT/LEFT HEART CATH AND CORONARY ANGIOGRAPHY N/A 09/06/2020   Procedure: RIGHT/LEFT HEART CATH AND CORONARY ANGIOGRAPHY;  Surgeon: Mady Bruckner, MD;  Location: [facility];  Service: Cardiovascular;  Laterality: N/A;   RIGHT/LEFT HEART CATH AND CORONARY ANGIOGRAPHY N/A 06/04/2022   Procedure: RIGHT/LEFT HEART CATH AND CORONARY ANGIOGRAPHY;  Surgeon: Mady Bruckner, MD;  Location: [facility];  Service: Cardiovascular;  Laterality: N/A;   ROTATOR CUFF REPAIR Right    SPINE SURGERY  1995   [place]   TOOTH EXTRACTION  11/13/2021   TOTAL ABDOMINAL HYSTERECTOMY W/ BILATERAL SALPINGOOPHORECTOMY  1974   TOTAL KNEE ARTHROPLASTY Bilateral 2007   Procedure: TOTAL KNEE ARTHROPLASTY; Surgeons: Kathi, MD and [name], MD   UMBILICAL HERNIA REPAIR  08/11/2015    Social History   Socioeconomic History   Marital status: Married    Spouse name: Koren    Number of children: 2   Years of education: College    Highest education level: Not on file  Occupational History    Employer: retired  Tobacco  Use   Smoking status: Never    Passive exposure: Never   Smokeless tobacco: Never  Vaping Use   Vaping status: Never Used  Substance and Sexual Activity   Alcohol  use: No   Drug use: Not Currently    Comment: prescribed valium    Sexual activity: Not Currently    Birth control/protection: Post-menopausal, Surgical  Other Topics Concern   Not on file  Social History Narrative   Patient lives at home with husband Koren.    Right Handed   Drinks caffeinated tea occasionally   One pet in house, dog   Social Drivers of Health   Financial Resource Strain: Low Risk  (09/15/2021)   Overall Financial Resource Strain (CARDIA)    Difficulty of Paying Living Expenses: Not hard at all  Food Insecurity: No Food Insecurity (04/05/2023)   Hunger Vital Sign    Worried About Running Out of Food in the Last Year: Never true    Ran Out of Food in the Last Year: Never true  Transportation Needs: No Transportation Needs (04/05/2023)   PRAPARE - Administrator, Civil Service (Medical): No    Lack of Transportation (Non-Medical): No  Physical Activity: Insufficiently Active (11/15/2020)   Exercise Vital Sign    Days of Exercise per Week: 2 days    Minutes of Exercise per Session: 40 min  Stress: Stress Concern Present (08/03/2020)   [employer]  of Occupational Health - Occupational Stress Questionnaire    Feeling of Stress : Rather much  Social Connections: Not on file    Family History  Problem Relation Age of Onset   Heart disease Father    Hypertension Father    Prostate cancer Father    Stroke Father    Osteoporosis Father    Stroke Mother    Depression Mother    Headache Mother    Heart disease Mother    Thyroid  disease Mother    Hypertension Mother    Diabetes Daughter    Heart disease Daughter    Hypertension Daughter    Hypertension Son     Outpatient Encounter Medications as of 01/07/2024  Medication Sig   apixaban  (ELIQUIS ) 5 MG TABS tablet Take 1 tablet (5 mg total) by mouth 2 (two) times daily.   atorvastatin  (LIPITOR ) 80 MG tablet Take 1 tablet (80 mg total) by mouth daily.   budesonide -formoterol  (SYMBICORT ) 160-4.5 MCG/ACT inhaler Inhale 2 puffs into the lungs 2 (two) times daily.   butalbital -acetaminophen -caffeine  (FIORICET ) 50-325-40 MG tablet TAKE 1 TABLET BY MOUTH EVERY 6 HOURS AS NEEDED FOR HEADACHE.   Cholecalciferol  (VITAMIN D3) 250 MCG (10000 UT) capsule Take 10,000 Units by mouth daily.   clotrimazole -betamethasone  (LOTRISONE ) cream Apply 1 application topically 2 (two) times daily as needed (irritation).   colchicine  0.6 MG tablet Take 1 tablet (0.6 mg total) by mouth 2 (two) times daily.   conjugated estrogens  (PREMARIN ) vaginal cream Apply one pea-sized amount around the opening of the urethra daily for 2 weeks, then 3 times weekly moving forward.   Continuous Blood Gluc Receiver (FREESTYLE LIBRE 2 READER) DEVI Use to check sugar at least TID   cyanocobalamin  (VITAMIN B12) 1000 MCG/ML injection Inject 1 mL (1,000 mcg total) into the muscle every 30 (thirty) days. Inject 1 ml (1000 mcg ) IM weekly x 4,  Then monthly thereafter   dicyclomine  (BENTYL ) 20 MG tablet Take 1 tablet (20 mg total) by mouth 3 (three) times daily before meals.   DULoxetine  (CYMBALTA ) 60 MG capsule  Take 1 capsule (60 mg  total) by mouth daily.   ezetimibe  (ZETIA ) 10 MG tablet Take 1 tablet (10 mg total) by mouth daily.   fexofenadine (ALLEGRA) 180 MG tablet Take 180 mg by mouth as needed for allergies or rhinitis.   Finerenone  (KERENDIA ) 10 MG TABS Take 1 tablet (10 mg total) by mouth in the morning.   fluticasone  (FLONASE ) 50 MCG/ACT nasal spray Place 2 sprays into both nostrils daily.   Glucagon  (GVOKE HYPOPEN  2-PACK) 0.5 MG/0.1ML SOAJ Inject 1 auto injector subcutaneously as needed for severe hypoglycemia. May repeat x 1 after 15 minutes if needed.   glucose blood (ONETOUCH VERIO) test strip Use to check blood sugar twice daily.   hydrocortisone  (ANUSOL -HC) 25 MG suppository Place 1 suppository (25 mg total) rectally 2 (two) times daily.   Insulin  Pen Needle (DROPLET PEN NEEDLES) 31G X 5 MM MISC USE ONE NEEDLE SUBCUTANEOUSLY AS DIRECTED (REMOVE AND DISCARD NEEDLE IN SHARPS CONTAINER IMMEDIATELY AFTER USE)   lidocaine  (LIDODERM ) 5 % Place 1 patch onto the skin daily. Remove & Discard patch within 12 hours or as directed by MD   lipase/protease/amylase (CREON ) 36000 UNITS CPEP capsule Take 2 capsules (72,000 Units total) by mouth 3 (three) times daily before meals AND 1 capsule (36,000 Units total) with snacks.   loperamide  (IMODIUM ) 2 MG capsule Take 4-6 mg by mouth 3 (three) times daily before meals.   mometasone  (ELOCON ) 0.1 % cream Apply topically.   mupirocin  cream (BACTROBAN ) 2 % Apply topically.   nitroGLYCERIN  (NITROSTAT ) 0.4 MG SL tablet Place 1 tablet (0.4 mg total) under the tongue every 5 (five) minutes as needed for chest pain.   nystatin  (MYCOSTATIN /NYSTOP ) powder Apply 1 g topically 4 (four) times daily as needed (rash).   ondansetron  (ZOFRAN -ODT) 4 MG disintegrating tablet Take 1 tablet (4 mg total) by mouth every 8 (eight) hours as needed for nausea or vomiting.   OneTouch Delica Lancets 33G MISC Used to check blood sugar two times a day.   OVER THE COUNTER MEDICATION Apply 1 application   topically daily as needed (pain). [place] Gel topical pain reliever   pantoprazole  (PROTONIX ) 40 MG tablet Take 1 tablet (40 mg total) by mouth daily.   Polyethyl Glycol-Propyl Glycol (SYSTANE OP) Place 1 drop into both eyes 4 (four) times daily as needed (dry eyes).   potassium chloride  20 MEQ/15ML (10%) SOLN Take 15 mLs (20 mEq total) by mouth daily.   pregabalin  (LYRICA ) 50 MG capsule Take 1 capsule (50 mg total) by mouth 2 (two) times daily.   tiZANidine  (ZANAFLEX ) 4 MG tablet Take 1 tablet (4 mg total) by mouth in the morning and at bedtime.   torsemide  (DEMADEX ) 20 MG tablet Take 3 tablets (60 mg total) by mouth daily.   [DISCONTINUED] dapagliflozin  propanediol (FARXIGA ) 10 MG TABS tablet Take 1 tablet (10 mg total) by mouth daily.   [DISCONTINUED] insulin  aspart (NOVOLOG  FLEXPEN) 100 UNIT/ML FlexPen Inject 10-16 Units into the skin 3 (three) times daily with meals.   [DISCONTINUED] insulin  degludec (TRESIBA  FLEXTOUCH) 100 UNIT/ML FlexTouch Pen Inject 30 Units into the skin daily.   Continuous Glucose Sensor (FREESTYLE LIBRE 3 PLUS SENSOR) MISC Change sensor every 15 days.   dapagliflozin  propanediol (FARXIGA ) 10 MG TABS tablet Take 1 tablet (10 mg total) by mouth daily.   insulin  aspart (NOVOLOG  FLEXPEN) 100 UNIT/ML FlexPen Inject 8-14 Units into the skin 3 (three) times daily with meals.   insulin  degludec (TRESIBA  FLEXTOUCH) 100 UNIT/ML FlexTouch Pen Inject 15 Units  into the skin daily.   [DISCONTINUED] Continuous Glucose Sensor (FREESTYLE LIBRE 3 PLUS SENSOR) MISC Change sensor every 15 days. (Patient not taking: Reported on 01/07/2024)   No facility-administered encounter medications on file as of 01/07/2024.    ALLERGIES: Allergies  Allergen Reactions   Demeclocycline Hives   Erythromycin Nausea And Vomiting and Other (See Comments)    Severe irritable bowel   Flagyl [Metronidazole] Nausea And Vomiting and Other (See Comments)    Severe irritable bowel   Glucophage [Metformin Hcl]  Nausea And Vomiting and Other (See Comments)    Sick I won't take anything that has metformin in it   Tetracyclines & Related Hives and Rash   Potassium Chloride  Other (See Comments)    Cannot swallow potssium pills, able to tolerate liquid potassium   Tetracycline Other (See Comments)   Diovan [Valsartan] Nausea Only        Sulfa  Antibiotics Other (See Comments) and Rash    As child   Xanax [Alprazolam] Other (See Comments)    Hyperactivity     VACCINATION STATUS: Immunization History  Administered Date(s) Administered   Fluad Quad(high Dose 65+) 03/06/2019, 06/07/2020, 07/27/2021, 07/13/2022   Fluad Trivalent(High Dose 65+) 03/29/2023   Influenza Split 06/13/2011   Influenza, High Dose Seasonal PF 08/21/2018   Influenza,inj,Quad PF,6+ Mos 05/04/2014, 05/12/2015   Influenza-Unspecified 04/15/2012   Moderna Sars-Covid-2 Vaccination 02/02/2020, 03/01/2020, 09/01/2020   Pneumococcal Conjugate-13 08/11/2013, 01/06/2014   Pneumococcal Polysaccharide-23 01/19/2010, 09/03/2018   Tdap 04/18/2016, 11/20/2020    Diabetes She presents for her follow-up diabetic visit. She has type 2 diabetes mellitus. Onset time: Diagnosed at approx age of 72. Her disease course has been improving. There are no hypoglycemic associated symptoms. (syncope) Associated symptoms include fatigue, foot paresthesias, polydipsia and polyuria (on diuretics). Pertinent negatives for diabetes include no weight loss. There are no hypoglycemic complications. (Been known to have syncopal episodes in the past) Symptoms are stable. Diabetic complications include a CVA (incidental finding), heart disease, nephropathy and peripheral neuropathy. Risk factors for coronary artery disease include obesity, hypertension, diabetes mellitus, dyslipidemia, family history, sedentary lifestyle and post-menopausal. Current diabetic treatment includes intensive insulin  program and oral agent (monotherapy). She is compliant with treatment  most of the time. Her weight is increasing rapidly. She is following a generally unhealthy (she admits to eating on the go a lot due to various appointments) diet. When asked about meal planning, she reported none. She has not had a previous visit with a dietitian. She rarely participates in exercise. Her home blood glucose trend is decreasing steadily. Her overall blood glucose range is 140-180 mg/dl. (She presents today, accompanied by her husband, with her CGM showing fluctuating glycemic profile with at target readings overall.  Her POCT A1c today is 7.6%, improving from last visit of 9.3%.  Analysis of her CGM shows TIR 65%, TAR 35%, TBR 0% with a GMI of 7.4%.  She has been taking less Tresiba  recently due to drops in glucose.  She also notes she waits until her glucose spikes after eating at times to dose her insulin .  She was on prednisone  a few weeks ago.) An ACE inhibitor/angiotensin II receptor blocker is not being taken. She sees a podiatrist.Eye exam is current.     Review of systems  Constitutional: + increasing body weight-unable to exercise due to left ankle/foot injury, current Body mass index is 42.57 kg/m., + fatigue, no subjective hyperthermia, no subjective hypothermia Eyes: no blurry vision, no xerophthalmia ENT: no sore throat, no nodules palpated  in throat, no dysphagia/odynophagia, no hoarseness Cardiovascular: no chest pain, no shortness of breath, no palpitations, + intermittent leg swelling Respiratory: no cough, + shortness of breath Gastrointestinal: no nausea/vomiting, on Creon  for EPI Musculoskeletal: no muscle/joint aches, walks with walker, left foot in ortho boot Skin: no rashes, no hyperemia Neurological: no tremors, no numbness, no tingling, no dizziness Psychiatric: no depression, no anxiety  Objective:     BP 110/66 (BP Location: Right Arm, Patient Position: Sitting, Cuff Size: Large)   Pulse 82   Ht 5' 4 (1.626 m)   Wt 248 lb (112.5 kg)   BMI 42.57  kg/m   Wt Readings from Last 3 Encounters:  01/07/24 248 lb (112.5 kg)  12/31/23 240 lb (108.9 kg)  12/23/23 234 lb 4 oz (106.3 kg)     BP Readings from Last 3 Encounters:  01/07/24 110/66  12/31/23 106/62  12/23/23 111/66     Physical Exam- Limited  Constitutional:  Body mass index is 42.57 kg/m. , not in acute distress, normal state of mind Eyes:  EOMI, no exophthalmos Musculoskeletal: no gross deformities, strength intact in all four extremities, no gross restriction of joint movements, walks with walker Skin:  no rashes, no hyperemia Neurological: no tremor with outstretched hands  Diabetic Foot Exam - Simple   No data filed     CMP ( most recent) CMP     Component Value Date/Time   NA 137 11/28/2023 1308   NA 137 10/21/2023 1417   NA 141 06/21/2014 1349   K 4.1 11/28/2023 1308   K 4.0 06/21/2014 1349   CL 101 11/28/2023 1308   CL 106 06/21/2014 1349   CO2 24 11/28/2023 1308   CO2 24 06/21/2014 1349   GLUCOSE 324 (H) 11/28/2023 1308   GLUCOSE 174 (H) 06/21/2014 1349   BUN 28 (H) 11/28/2023 1308   BUN 30 (H) 10/21/2023 1417   BUN 12 06/21/2014 1349   CREATININE 1.32 (H) 11/28/2023 1308   CREATININE 1.14 (H) 07/13/2022 1529   CALCIUM  8.7 (L) 11/28/2023 1308   CALCIUM  8.7 06/21/2014 1349   PROT 7.6 11/28/2023 1308   PROT 7.3 10/30/2012 1436   ALBUMIN  2.9 (L) 11/28/2023 1308   ALBUMIN  3.0 (L) 10/30/2012 1436   AST 20 11/28/2023 1308   ALT 17 11/28/2023 1308   ALT 19 10/30/2012 1436   ALKPHOS 159 (H) 11/28/2023 1308   ALKPHOS 125 10/30/2012 1436   BILITOT 0.5 11/28/2023 1308   GFRNONAA 41 (L) 11/28/2023 1308   GFRNONAA 57 (L) 06/21/2014 1349   GFRNONAA 55 (L) 02/25/2014 2153   GFRNONAA 70 12/13/2011 0919   GFRAA >60 03/23/2020 1204   GFRAA >60 06/21/2014 1349   GFRAA >60 02/25/2014 2153   GFRAA 80 12/13/2011 0919     Diabetic Labs (most recent): Lab Results  Component Value Date   HGBA1C 7.6 (A) 01/07/2024   HGBA1C 9.3 (A) 09/30/2023   HGBA1C  9.5 (A) 06/24/2023   MICROALBUR 0.4 09/30/2023   MICROALBUR <0.7 11/21/2021   MICROALBUR 2.8 (H) 06/04/2017     Lipid Panel ( most recent) Lipid Panel     Component Value Date/Time   CHOL 90 10/09/2023 1204   CHOL 155 02/27/2014 0420   TRIG 100 10/09/2023 1204   TRIG 121 02/27/2014 0420   HDL 43 (L) 10/09/2023 1204   HDL 43 02/27/2014 0420   CHOLHDL 2.1 10/09/2023 1204   VLDL 20 10/23/2022 1324   VLDL 24 02/27/2014 0420   LDLCALC 28 10/09/2023 1204  LDLCALC 88 02/27/2014 0420   LDLDIRECT 126 (H) 07/13/2022 1529      Lab Results  Component Value Date   TSH 2.887 09/27/2022   TSH 2.209 02/08/2021   TSH 3.695 07/17/2020   TSH 3.50 01/01/2019   TSH 2.279 08/20/2017   TSH 4.400 06/16/2015   TSH 1.16 05/03/2015   TSH 0.408 (L) 10/29/2014   TSH 5.099 (H) 08/13/2014   TSH 0.93 12/03/2013   FREET4 1.10 05/03/2015           Assessment & Plan:   1) Type 2 diabetes mellitus with hyperglycemia, with long-term current use of insulin  (HCC)  She presents today, accompanied by her husband, with her CGM showing fluctuating glycemic profile with at target readings overall.  Her POCT A1c today is 7.6%, improving from last visit of 9.3%.  Analysis of her CGM shows TIR 65%, TAR 35%, TBR 0% with a GMI of 7.4%.  She has been taking less Tresiba  recently due to drops in glucose.  She also notes she waits until her glucose spikes after eating at times to dose her insulin .  She was on prednisone  a few weeks ago.  - Denise Sharp has currently uncontrolled symptomatic type 2 DM since 78 years of age.   -Recent labs reviewed.  - I had a long discussion with her about the progressive nature of diabetes and the pathology behind its complications. -her diabetes is complicated by CAD, CHF, Neuropathy, CKD stage 3, CVA and she remains at a high risk for more acute and chronic complications which include CAD, CVA, CKD, retinopathy, and neuropathy. These are all discussed in detail with  her.  The following Lifestyle Medicine recommendations according to American College of Lifestyle Medicine [facility]) were discussed and offered to patient and she agrees to start the journey:  A. Whole Foods, Plant-based plate comprising of fruits and vegetables, plant-based proteins, whole-grain carbohydrates was discussed in detail with the patient.   A list for source of those nutrients were also provided to the patient.  Patient will use only water  or unsweetened tea for hydration. B.  The need to stay away from risky substances including alcohol , smoking; obtaining 7 to 9 hours of restorative sleep, at least 150 minutes of moderate intensity exercise weekly, the importance of healthy social connections,  and stress reduction techniques were discussed. C.  A full color page of  Calorie density of various food groups per pound showing examples of each food groups was provided to the patient.  - Nutritional counseling repeated at each appointment due to patients tendency to fall back in to old habits.  - The patient admits there is a room for improvement in their diet and drink choices. -  Suggestion is made for the patient to avoid simple carbohydrates from their diet including Cakes, Sweet Desserts / Pastries, Ice Cream, Soda (diet and regular), Sweet Tea, Candies, Chips, Cookies, Sweet Pastries, Store Bought Juices, Alcohol  in Excess of 1-2 drinks a day, Artificial Sweeteners, Coffee Creamer, and Sugar-free Products. This will help patient to have stable blood glucose profile and potentially avoid unintended weight gain.   - I encouraged the patient to switch to unprocessed or minimally processed complex starch and increased protein intake (animal or plant source), fruits, and vegetables.   - Patient is advised to stick to a routine mealtimes to eat 3 meals a day and avoid unnecessary snacks (to snack only to correct hypoglycemia).  - I have approached her with the following individualized plan  to  manage her diabetes and patient agrees:   -She is advised to continue her Tresiba  15 units SQ nightly and lower Novolog  to 8-14 units TID with meals if glucose is above 70 and she is eating (Specific instructions on how to titrate insulin  dosage based on glucose readings given to patient in writing).  She can also continue her Farxiga  10 mg po daily.   She did ask me about the pink salt diet today and if it would help her.  After doing more research, I do not feel this would help her, would likely complicate her chronic kidney disease and electrolytes.  -she is encouraged to continue monitoring glucose 4 times daily (using her CGM), before meals and before bed, and to call the clinic if she has readings less than 70 or above 300 for 3 tests in a row.  - she is warned not to take insulin  without proper monitoring per orders. - Adjustment parameters are given to her for hypo and hyperglycemia in writing.  - she is not a candidate for Metformin due to concurrent renal insufficiency, nor did she tolerate it in the past.  She also did not tolerate GLP1 products due to nausea previously.  - Specific targets for  A1c; LDL, HDL, and Triglycerides were discussed with the patient.  2) Blood Pressure /Hypertension:  her blood pressure is controlled to target-slightly low today in office.   she is advised to continue her current medications per cardiology/PCP.  3) Lipids/Hyperlipidemia:    Review of her recent lipid panel from 10/23/22 showed controlled LDL at 45 .  she is advised to continue Lipitor  80 mg daily at bedtime and Zetia  10 mg po daily.  Side effects and precautions discussed with her.  4)  Weight/Diet:  her Body mass index is 42.57 kg/m.  -  clearly complicating her diabetes care.   she is a candidate for weight loss. I discussed with her the fact that loss of 5 - 10% of her  current body weight will have the most impact on her diabetes management.  Exercise, and detailed carbohydrates  information provided  -  detailed on discharge instructions.  She has had gastric bypass surgery x 2.  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  Recent imaging of abdomen (done for different reason) shows stable mass, still consistent with benignity.  She will not need additional follow up for this.  6) Chronic Care/Health Maintenance: -she is not on ACEI/ARB and is on Statin medications and is encouraged to initiate and continue to follow up with Ophthalmology, Dentist, Podiatrist at least yearly or according to recommendations, and advised to stay away from smoking. I have recommended yearly flu vaccine and pneumonia vaccine at least every 5 years; moderate intensity exercise for up to 150 minutes weekly; and sleep for at least 7 hours a day.  - she is advised to maintain close follow up with Tullo, Teresa L, MD for primary care needs, as well as her other providers for optimal and coordinated care.      I spent  52  minutes in the care of the patient today including review of labs from CMP, Lipids, Thyroid  Function, Hematology (current and previous including abstractions from other facilities); face-to-face time discussing  her blood glucose readings/logs, discussing hypoglycemia and hyperglycemia episodes and symptoms, medications doses, her options of short and long term treatment based on the latest standards of care / guidelines;  discussion about incorporating lifestyle medicine;  and  documenting the encounter. Risk reduction counseling performed per USPSTF guidelines to reduce obesity and cardiovascular risk factors.     Please refer to Patient Instructions for Blood Glucose Monitoring and Insulin /Medications Dosing Guide  in media tab for additional information. Please  also refer to  Patient Self Inventory in the Media  tab for reviewed elements of pertinent patient history.  Denise Sharp participated in the discussions, expressed understanding, and  voiced agreement with the above plans.  All questions were answered to her satisfaction. she is encouraged to contact clinic should she have any questions or concerns prior to her return visit.     Follow up plan: - Return in about 4 months (around 05/08/2024) for Diabetes F/U with A1c in office, No previsit labs, Bring meter and logs.  Benton Rio, [facility] [address] [place], [place] [address] Phone: [phone] Fax: [phone]  01/07/2024, 2:53 PM

## 2024-01-09 ENCOUNTER — Ambulatory Visit (INDEPENDENT_AMBULATORY_CARE_PROVIDER_SITE_OTHER): Admitting: Internal Medicine

## 2024-01-09 ENCOUNTER — Encounter: Payer: Self-pay | Admitting: Internal Medicine

## 2024-01-09 VITALS — BP 108/56 | HR 100 | Ht 64.0 in | Wt 243.4 lb

## 2024-01-09 DIAGNOSIS — M19272 Secondary osteoarthritis, left ankle and foot: Secondary | ICD-10-CM

## 2024-01-09 DIAGNOSIS — M79672 Pain in left foot: Secondary | ICD-10-CM | POA: Diagnosis not present

## 2024-01-09 DIAGNOSIS — R809 Proteinuria, unspecified: Secondary | ICD-10-CM

## 2024-01-09 DIAGNOSIS — Z7984 Long term (current) use of oral hypoglycemic drugs: Secondary | ICD-10-CM | POA: Diagnosis not present

## 2024-01-09 DIAGNOSIS — E1129 Type 2 diabetes mellitus with other diabetic kidney complication: Secondary | ICD-10-CM

## 2024-01-09 MED ORDER — DICYCLOMINE HCL 20 MG PO TABS
20.0000 mg | ORAL_TABLET | Freq: Three times a day (TID) | ORAL | 2 refills | Status: AC
Start: 2024-01-09 — End: ?

## 2024-01-09 MED ORDER — ATORVASTATIN CALCIUM 80 MG PO TABS: 80.0000 mg | ORAL_TABLET | Freq: Every day | ORAL | 1 refills | Status: DC

## 2024-01-09 MED ORDER — EZETIMIBE 10 MG PO TABS: 10.0000 mg | ORAL_TABLET | Freq: Every day | ORAL | 3 refills | Status: DC

## 2024-01-09 MED ORDER — DICYCLOMINE HCL 20 MG PO TABS: 20.0000 mg | ORAL_TABLET | Freq: Three times a day (TID) | ORAL | 2 refills | Status: DC

## 2024-01-09 MED ORDER — TRAMADOL HCL 50 MG PO TABS
50.0000 mg | ORAL_TABLET | Freq: Four times a day (QID) | ORAL | 0 refills | Status: AC | PRN
Start: 2024-01-09 — End: 2024-01-16

## 2024-01-09 MED ORDER — ATORVASTATIN CALCIUM 80 MG PO TABS
80.0000 mg | ORAL_TABLET | Freq: Every day | ORAL | 1 refills | Status: DC
Start: 2024-01-09 — End: 2024-04-07

## 2024-01-09 NOTE — Progress Notes (Signed)
 Subjective:  Patient ID: Denise Sharp, female    DOB: 1946-02-22  Age: 78 y.o. MRN: [id number]  CC: The primary encounter diagnosis was Acute foot pain, left. Diagnoses of Other secondary osteoarthritis of left foot, Morbid obesity (HCC), and Microalbuminuria due to type 2 diabetes mellitus (HCC) were also pertinent to this visit.   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  REQUEST FOR NARCOTICS DEFERRED.  COLCHICINE  DOSE WAS INCREASED TO BID   HAS SEEN PODIATRY SINCE APPT WITH [facility]  FOR SEVERE PAIN IN FOOT , X RAYS SUGGEST  REAR FOOT COLLAPSE  OF JOINT NOW WEARING A BOOT ,  WAITING FOR INSURANCE TO APPROVE THE ORDERED  MRI  .  USING WALKER   NOT USING DIURETICS REGULARLY DUE TO USE OF BOOT AND URINARY INCONTINENCE.  TOOK A DOSE YESTERDAY AND HAD EXCESSIVE URINATION . 60 MG TORSEMIDE   DENIES SHORTNESS OF BREATH EXCEPT  WITH EXERTION , BECAUSE OF HAULING THIS BOOT AROUND)   Type 2 DM  uncontrolled: reviewed endocrinology notes ;   control has improved but hypoglycemic episodes are occurring  due to patient's recurrent postprandial use of mealtime insulin  . Doses were reduced   CKD:  advised to reduce fluid intake to 1200 cc's daily by Dr Dominica,; however,  finding this difficult to follow in light of torsemide  dose .  Has normal EF (60 to 65) by Sept 2024 ECHO.    Outpatient Medications Prior to Visit  Medication Sig Dispense Refill   apixaban  (ELIQUIS ) 5 MG TABS tablet Take 1 tablet (5 mg total) by mouth 2 (two) times daily. 180 tablet 1   budesonide -formoterol  (SYMBICORT ) 160-4.5 MCG/ACT inhaler Inhale 2 puffs into the lungs 2 (two) times daily. 1 each 6   butalbital -acetaminophen -caffeine  (FIORICET ) 50-325-40 MG tablet TAKE 1 TABLET BY MOUTH EVERY 6 HOURS AS NEEDED FOR HEADACHE. 60 tablet 2   Cholecalciferol  (VITAMIN  D3) 250 MCG (10000 UT) capsule Take 10,000 Units by mouth daily.     clotrimazole -betamethasone  (LOTRISONE ) cream Apply 1 application topically 2 (two) times daily as needed (irritation).     colchicine  0.6 MG tablet Take 1 tablet (0.6 mg total) by mouth 2 (two) times daily. 60 tablet 0   conjugated estrogens  (PREMARIN ) vaginal cream Apply one pea-sized amount around the opening of the urethra daily for 2 weeks, then 3 times weekly moving forward. 30 g 4   Continuous Glucose Sensor (FREESTYLE LIBRE 3 PLUS SENSOR) MISC Change sensor every 15 days. 6 each 3   cyanocobalamin  (VITAMIN B12) 1000 MCG/ML injection Inject 1 mL (1,000 mcg total) into the muscle every 30 (thirty) days. Inject 1 ml (1000 mcg ) IM weekly x 4,  Then monthly thereafter 3 mL 1   dapagliflozin  propanediol (FARXIGA ) 10 MG TABS tablet Take 1 tablet (10 mg total) by mouth daily. 90 tablet 3   DULoxetine  (CYMBALTA ) 60 MG capsule Take 1 capsule (60 mg total) by mouth daily. 90 capsule 3   fexofenadine (ALLEGRA) 180 MG tablet Take 180 mg by mouth as needed for allergies or rhinitis.     Finerenone  (KERENDIA ) 10 MG TABS Take 1 tablet (10 mg total) by mouth in the morning. 90 tablet 3   fluticasone  (FLONASE ) 50 MCG/ACT nasal spray Place 2 sprays into both nostrils daily. 16 g 6  Glucagon  (GVOKE HYPOPEN  2-PACK) 0.5 MG/0.1ML SOAJ Inject 1 auto injector subcutaneously as needed for severe hypoglycemia. May repeat x 1 after 15 minutes if needed. 0.1 mL 2   glucose blood (ONETOUCH VERIO) test strip Use to check blood sugar twice daily. 100 each 1   hydrocortisone  (ANUSOL -HC) 25 MG suppository Place 1 suppository (25 mg total) rectally 2 (two) times daily. 24 suppository 5   insulin  aspart (NOVOLOG  FLEXPEN) 100 UNIT/ML FlexPen Inject 8-14 Units into the skin 3 (three) times daily with meals. 36 mL 3   insulin  degludec (TRESIBA  FLEXTOUCH) 100 UNIT/ML FlexTouch Pen Inject 15 Units into the skin daily. 15 mL 3   Insulin  Pen Needle (DROPLET PEN  NEEDLES) 31G X 5 MM MISC USE ONE NEEDLE SUBCUTANEOUSLY AS DIRECTED (REMOVE AND DISCARD NEEDLE IN SHARPS CONTAINER IMMEDIATELY AFTER USE) 100 each 1   lidocaine  (LIDODERM ) 5 % Place 1 patch onto the skin daily. Remove & Discard patch within 12 hours or as directed by MD 30 patch 0   lipase/protease/amylase (CREON ) 36000 UNITS CPEP capsule Take 2 capsules (72,000 Units total) by mouth 3 (three) times daily before meals AND 1 capsule (36,000 Units total) with snacks. 300 capsule 9   loperamide  (IMODIUM ) 2 MG capsule Take 4-6 mg by mouth 3 (three) times daily before meals.     mometasone  (ELOCON ) 0.1 % cream Apply topically.     mupirocin  cream (BACTROBAN ) 2 % Apply topically.     nitroGLYCERIN  (NITROSTAT ) 0.4 MG SL tablet Place 1 tablet (0.4 mg total) under the tongue every 5 (five) minutes as needed for chest pain. 25 tablet 5   nystatin  (MYCOSTATIN /NYSTOP ) powder Apply 1 g topically 4 (four) times daily as needed (rash).     ondansetron  (ZOFRAN -ODT) 4 MG disintegrating tablet Take 1 tablet (4 mg total) by mouth every 8 (eight) hours as needed for nausea or vomiting. 30 tablet 0   OneTouch Delica Lancets 33G MISC Used to check blood sugar two times a day. 100 each 4   OVER THE COUNTER MEDICATION Apply 1 application  topically daily as needed (pain). [place] Gel topical pain reliever     pantoprazole  (PROTONIX ) 40 MG tablet Take 1 tablet (40 mg total) by mouth daily. 90 tablet 3   Polyethyl Glycol-Propyl Glycol (SYSTANE OP) Place 1 drop into both eyes 4 (four) times daily as needed (dry eyes).     potassium chloride  20 MEQ/15ML (10%) SOLN Take 15 mLs (20 mEq total) by mouth daily. 473 mL 3   pregabalin  (LYRICA ) 50 MG capsule Take 1 capsule (50 mg total) by mouth 2 (two) times daily. 180 capsule 1   tiZANidine  (ZANAFLEX ) 4 MG tablet Take 1 tablet (4 mg total) by mouth in the morning and at bedtime. 60 tablet 0   tiZANidine  (ZANAFLEX ) 4 MG tablet Take 4 mg by mouth.     torsemide  (DEMADEX ) 20 MG tablet  Take 3 tablets (60 mg total) by mouth daily. 90 tablet 11   atorvastatin  (LIPITOR ) 80 MG tablet Take 1 tablet (80 mg total) by mouth daily. 90 tablet 1   dicyclomine  (BENTYL ) 20 MG tablet Take 1 tablet (20 mg total) by mouth 3 (three) times daily before meals. 180 tablet 2   ezetimibe  (ZETIA ) 10 MG tablet Take 1 tablet (10 mg total) by mouth daily. 90 tablet 3   Continuous Blood Gluc Receiver (FREESTYLE LIBRE 2 READER) DEVI Use to check sugar at least TID 1 each 1   No facility-administered medications prior to visit.  Review of Systems;  Patient denies headache, fevers, malaise, unintentional weight loss, skin rash, eye pain, sinus congestion and sinus pain, sore throat, dysphagia,  hemoptysis , cough, dyspnea, wheezing, chest pain, palpitations, orthopnea, edema, abdominal pain, nausea, melena, diarrhea, constipation, flank pain, dysuria, hematuria, urinary  Frequency, nocturia, numbness, tingling, seizures,  Focal weakness, Loss of consciousness,  Tremor, insomnia, depression, anxiety, and suicidal ideation.      Objective:  BP (!) 108/56   Pulse 100   Ht 5' 4 (1.626 m)   Wt 243 lb 6.4 oz (110.4 kg)   SpO2 96%   BMI 41.78 kg/m   BP Readings from Last 3 Encounters:  01/09/24 (!) 108/56  01/07/24 110/66  12/31/23 106/62    Wt Readings from Last 3 Encounters:  01/09/24 243 lb 6.4 oz (110.4 kg)  01/07/24 248 lb (112.5 kg)  12/31/23 240 lb (108.9 kg)    Physical Exam  Lab Results  Component Value Date   HGBA1C 7.6 (A) 01/07/2024   HGBA1C 9.3 (A) 09/30/2023   HGBA1C 9.5 (A) 06/24/2023    Lab Results  Component Value Date   CREATININE 1.32 (H) 11/28/2023   CREATININE 1.69 (H) 11/06/2023   CREATININE 1.42 (H) 10/21/2023    Lab Results  Component Value Date   WBC 11.5 (H) 11/28/2023   HGB 11.2 (L) 11/28/2023   HCT 36.1 11/28/2023   PLT 396 11/28/2023   GLUCOSE 324 (H) 11/28/2023   CHOL 90 10/09/2023   TRIG 100 10/09/2023   HDL 43 (L) 10/09/2023   LDLDIRECT  126 (H) 07/13/2022   LDLCALC 28 10/09/2023   ALT 17 11/28/2023   AST 20 11/28/2023   NA 137 11/28/2023   K 4.1 11/28/2023   CL 101 11/28/2023   CREATININE 1.32 (H) 11/28/2023   BUN 28 (H) 11/28/2023   CO2 24 11/28/2023   TSH 2.887 09/27/2022   INR 1.3 (H) 12/14/2022   HGBA1C 7.6 (A) 01/07/2024   MICROALBUR 0.4 09/30/2023    NM PET CT CARDIAC PERFUSION MULTI W/ABSOLUTE BLOODFLOW Result Date: 11/21/2023   LV perfusion is normal. There is no evidence of ischemia. There is no evidence of infarction.   Rest left ventricular function is normal. Rest EF: 76%. Stress left ventricular function is normal. Stress EF: 77%. End diastolic cavity size is normal. End systolic cavity size is normal.   Myocardial blood flow was computed to be 0.48ml/g/min at rest and 2.16ml/g/min at stress. Global myocardial blood flow reserve was 2.30 and was normal.   Coronary calcium  assessment not performed due to prior revascularization.Prior stents to LAD   Findings are consistent with no ischemia and no infarction. The study is low risk. See caveat regarding TID being present   TID present at 1.2 visual and quantitative clinical correlation suggested CLINICAL DATA:  This over-read does not include interpretation of cardiac or coronary anatomy or pathology. No interpretation the PET data set. The cardiac PET-CT interpretation by the cardiologist is attached. COMPARISON:  CT abdomen pelvis 10/14/2022 FINDINGS: Limited view of the lung parenchyma demonstrates no suspicious nodularity. Airways are normal. Limited view of the mediastinum demonstrates no adenopathy. Esophagus normal. Limited view of the upper abdomen demonstrates post bariatric surgery. Nodular thickening of the LEFT adrenal gland to 12 mm not changed from CT 1 year prior. Nodule had imaging characteristics consistent benign adenoma on that scan. Limited view of the skeleton and chest wall is unremarkable. IMPRESSION: 1. Post bariatric surgery. 2. Benign LEFT adrenal  adenoma. No follow-up recommended for benign  lesion. Electronically Signed   By: Jackquline Boxer M.D.   On: 11/21/2023 10:01   Assessment & Plan:  .Acute foot pain, left -     traMADol  HCl; Take 1 tablet (50 mg total) by mouth every 6 (six) hours as needed for up to 7 days.  Dispense: 28 tablet; Refill: 0 -     For home use only DME standard manual wheelchair with seat cushion  Other secondary osteoarthritis of left foot Assessment & Plan: Her pain is severe and the etiology is presumed to be s a collapsed  (Charcot?) joint, pending MRI ordered by podiatry.  Tramadol  prescribed for pain management    Morbid obesity [facility]) Assessment & Plan: Despite prior bariatric surgery and more recently, panniculectomy.   Historically the only time she lost weight was when her daughter cooked for her and adhered to the KETO diet .  She is sedentary.    Microalbuminuria due to type 2 diabetes mellitus (HCC) Assessment & Plan: Resolved by most recent testing.   ratio of 8.  She did not tolerate ARB secondary to nausea and hypotension .  Tolerating farxiga   Lab Results  Component Value Date   LABMICR Comment 12/23/2023   LABMICR See below: 06/24/2023   MICROALBUR 0.4 09/30/2023         Other orders -     Atorvastatin  Calcium ; Take 1 tablet (80 mg total) by mouth daily.  Dispense: 90 tablet; Refill: 1 -     Dicyclomine  HCl; Take 1 tablet (20 mg total) by mouth 3 (three) times daily before meals.  Dispense: 180 tablet; Refill: 2 -     Ezetimibe ; Take 1 tablet (10 mg total) by mouth daily.  Dispense: 90 tablet; Refill: 3     I spent 34 minutes on the day of this face to face encounter reviewing patient's  most recent visit with cardiology,  nephrology,  and podiatry,   prior relevant surgical and non surgical procedures, recent  labs and imaging studies, counseling on weight management,  reviewing the assessment and plan with patient, and post visit ordering and reviewing of  diagnostics and  therapeutics with patient  .   Follow-up: Return in about 3 months (around 04/10/2024) for chronic pain management, hypertension.   Verneita LITTIE Kettering, MD

## 2024-01-09 NOTE — Patient Instructions (Addendum)
 Do not worry about following Dr Elfredia advice about  the fluid restriction and meal restriction right now  Follow the endocrinologist advice.  I am prescribing tramadol  to take for pain . The  first prescription is only for 28 pills .   Use as needed , not more than 4 daily,  and when you request a refill, please let me know how often you are needing it so I can refill for 30 days   I have ordered a wheelchair

## 2024-01-10 ENCOUNTER — Telehealth: Payer: Self-pay | Admitting: Podiatry

## 2024-01-10 NOTE — Telephone Encounter (Signed)
 Patient still has not been scheduled for MRI. Patient is wanting to know status and what she needs to do, if anything. Thanks

## 2024-01-12 NOTE — Assessment & Plan Note (Signed)
 Despite prior bariatric surgery and more recently, panniculectomy.   Historically the only time she lost weight was when her daughter cooked for her and adhered to the KETO diet .  She is sedentary.

## 2024-01-12 NOTE — Assessment & Plan Note (Addendum)
 Resolved by most recent testing.   ratio of 8.  She did not tolerate ARB secondary to nausea and hypotension .  Tolerating farxiga   Lab Results  Component Value Date   LABMICR Comment 12/23/2023   LABMICR See below: 06/24/2023   MICROALBUR 0.4 09/30/2023

## 2024-01-12 NOTE — Assessment & Plan Note (Signed)
 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  Tramadol  prescribed for pain management

## 2024-01-14 ENCOUNTER — Telehealth: Payer: Self-pay | Admitting: Podiatry

## 2024-01-14 NOTE — Telephone Encounter (Signed)
 Patient called this am stating Dr Verta wanted her to have an MRI. Patient states she is getting iron  infusions and can not get an MRI for the next 3 months. Patient wanted to know if she can get a CT or something else because she doesn't want to wait. Patient thinks it might be broken. Left foot

## 2024-01-15 ENCOUNTER — Other Ambulatory Visit: Payer: Self-pay

## 2024-01-15 DIAGNOSIS — M25572 Pain in left ankle and joints of left foot: Secondary | ICD-10-CM

## 2024-01-16 ENCOUNTER — Other Ambulatory Visit: Payer: Self-pay

## 2024-01-16 DIAGNOSIS — M2142 Flat foot [pes planus] (acquired), left foot: Secondary | ICD-10-CM

## 2024-01-16 DIAGNOSIS — E11610 Type 2 diabetes mellitus with diabetic neuropathic arthropathy: Secondary | ICD-10-CM

## 2024-01-24 ENCOUNTER — Ambulatory Visit

## 2024-01-24 ENCOUNTER — Encounter: Payer: Self-pay | Admitting: Internal Medicine

## 2024-01-24 ENCOUNTER — Ambulatory Visit: Attending: Internal Medicine | Admitting: Internal Medicine

## 2024-01-24 ENCOUNTER — Ambulatory Visit
Admission: RE | Admit: 2024-01-24 | Discharge: 2024-01-24 | Disposition: A | Discharge status: Home / Self Care | Source: Ambulatory Visit | Attending: Podiatry | Admitting: Podiatry

## 2024-01-24 ENCOUNTER — Telehealth: Payer: Self-pay | Admitting: Cardiology

## 2024-01-24 VITALS — BP 106/71 | HR 79 | Resp 14 | Ht 64.5 in

## 2024-01-24 DIAGNOSIS — M109 Gout, unspecified: Secondary | ICD-10-CM

## 2024-01-24 DIAGNOSIS — M19072 Primary osteoarthritis, left ankle and foot: Secondary | ICD-10-CM | POA: Diagnosis not present

## 2024-01-24 DIAGNOSIS — E11610 Type 2 diabetes mellitus with diabetic neuropathic arthropathy: Secondary | ICD-10-CM | POA: Insufficient documentation

## 2024-01-24 DIAGNOSIS — M10372 Gout due to renal impairment, left ankle and foot: Secondary | ICD-10-CM | POA: Diagnosis not present

## 2024-01-24 DIAGNOSIS — E1122 Type 2 diabetes mellitus with diabetic chronic kidney disease: Secondary | ICD-10-CM

## 2024-01-24 DIAGNOSIS — M79641 Pain in right hand: Secondary | ICD-10-CM | POA: Diagnosis not present

## 2024-01-24 DIAGNOSIS — M2142 Flat foot [pes planus] (acquired), left foot: Secondary | ICD-10-CM | POA: Diagnosis not present

## 2024-01-24 DIAGNOSIS — E1142 Type 2 diabetes mellitus with diabetic polyneuropathy: Secondary | ICD-10-CM | POA: Diagnosis not present

## 2024-01-24 DIAGNOSIS — M79672 Pain in left foot: Secondary | ICD-10-CM | POA: Diagnosis not present

## 2024-01-24 DIAGNOSIS — M7989 Other specified soft tissue disorders: Secondary | ICD-10-CM | POA: Diagnosis not present

## 2024-01-24 DIAGNOSIS — N183 Chronic kidney disease, stage 3 unspecified: Secondary | ICD-10-CM | POA: Diagnosis not present

## 2024-01-24 MED ORDER — ALLOPURINOL 100 MG PO TABS: 100.0000 mg | ORAL_TABLET | Freq: Every day | ORAL | 1 refills | Status: DC

## 2024-01-24 NOTE — Telephone Encounter (Signed)
 Called to confirm/remind patient of [facility] appointment [facility] on 01/27[facility].   Appointment:   [x] Confirmed  [] Left mess   [] No answer/No voice mail  [] VM Full/unable to leave message  [] Phone not in service  Patient reminded to bring all medications and/or complete list.  Confirmed patient has transportation. Gave directions, instructed to utilize valet parking.

## 2024-01-24 NOTE — Progress Notes (Signed)
 Office Visit Note  Patient: Denise Sharp             Date of Birth: 07-18-1945           MRN: [id number]             PCP: Marylynn Verneita CROME, MD Referring: Marylynn Verneita CROME, MD Visit Date: 01/24/2024 Occupation: Retired Engineer, civil (consulting)  Subjective:  New Patient (Initial Visit) (Patient states she has severe pain and swelling in her left foot and leg. Patient states she has gout in her left foot. Patient states she had hand cramps and weakness as well. )    Discussed the use of AI scribe software for clinical note transcription with the patient, who gave verbal consent to proceed.  History of Present Illness   Denise Sharp is a 78 year old female here for evaluation of inflammation affecting her hand, feet, and ankle. She has a history of degenerative arthritis and stage 3 renal failure who presents with swelling and pain in her left foot and ankle. She is accompanied by her husband.  She has a long-standing history of degenerative arthritis affecting her spine, feet, and ankles. She uses mobility aids, including a scooter, walkers, and canes.  In March, her left foot began to swell significantly, and it was suggested to be gout. A tapering dose of prednisone  did not alleviate the swelling very well but caused her blood sugar to rise and she did not wish to repeat this. Her foot remains swollen, two sizes larger than normal, with stretch marks and an inability to bear weight. She describes the foot as feeling 'broken' and notes abnormal leg and foot alignment.  She has stage 3 renal failure, complicating treatment options for gout. She was advised that her kidneys could handle a short course of gout medication (sounds like referring to indomethacin), but she was not prescribed this. She takes colchicine , prescribed by her cardiologist, but it has not improved her foot condition much os far. She also takes 160 mg of torsemide , which exacerbates incontinence issues.  She experiences chronic pain  in her foot despite having no sensation from the knees down due to neuropathy. She recounts an incident where she walked across gravel without realizing her shoes had fallen off. Neuropathy tests have shown no feeling in her legs, yet she experiences severe pain in her foot.  Her hands have also been swelling, requiring a change in her wedding ring size. She attributes this to arthritis, as her joints are enlarging. She experiences cramps in her hands and toes, which she associates with neuropathy. Her uric acid level was recorded at 11.4.  She has a history of congestive heart failure and was hospitalized last year which was a new/index exacerbation leading to diagnosis. During this 30 pounds of fluid were removed. She has been on high doses of diuretics since then.  She has an artificial shoulder and reports swelling and redness in her joints, particularly in her hands and feet. She has not had recent imaging of her hands. She had a foot xray in May and is scheduled for a CT scan of her foot due to persistent swelling and pain. 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      Activities of Daily Living:  Patient reports morning stiffness for 0 minute.   Patient Reports nocturnal pain.  Difficulty dressing/grooming: Reports Difficulty climbing stairs: Reports Difficulty getting out of chair: Reports Difficulty using hands  for taps, buttons, cutlery, and/or writing: Reports  Review of Systems  Constitutional:  Positive for fatigue.  HENT:  Positive for mouth dryness. Negative for mouth sores.   Eyes:  Positive for dryness.  Respiratory:  Positive for shortness of breath.   Cardiovascular:  Positive for chest pain and palpitations.  Gastrointestinal:  Positive for blood in stool and diarrhea. Negative for constipation.  Endocrine: Negative for increased urination.  Genitourinary:  Positive for involuntary urination.   Musculoskeletal:  Positive for joint pain, gait problem, joint pain, joint swelling, myalgias, muscle weakness, muscle tenderness and myalgias. Negative for morning stiffness.  Skin:  Positive for color change. Negative for rash, hair loss and sensitivity to sunlight.  Allergic/Immunologic: Negative for susceptible to infections.  Neurological:  Positive for dizziness and headaches.  Hematological:  Negative for swollen glands.  Psychiatric/Behavioral:  Positive for depressed mood. Negative for sleep disturbance. The patient is nervous/anxious.     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   Persistent atrial fibrillation (HCC) 09/27/2022   Hypoglycemia due to insulin  09/24/2022   COVID-19 virus infection 09/01/2022   Abdominal bloating 07/25/2022   Cerebral vascular disease 06/11/2022   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   Orthostatic dizziness 04/03/2022   Gait abnormality 04/03/2022   Hx of amaurosis fugax 02/06/2022   Protein-calorie malnutrition (HCC) 11/21/2021   Absolute anemia 10/19/2021   Chronic neck pain 09/12/2021   PAC (premature atrial contraction) 08/19/2021       08/03/2021   Stricture and stenosis of esophagus    Pain in joint of right knee 05/17/2021   Sprain of right ankle 05/17/2021   Sprain of right knee 05/17/2021   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/09/2021   PSVT (paroxysmal supraventricular tachycardia) (HCC) 12/15/2020   CKD stage 3 due to type 2 diabetes mellitus (HCC)  12/09/2020   Other pulmonary embolism with acute cor pulmonale, unspecified chronicity (HCC) 11/17/2020   Mild episode of recurrent major depressive disorder (HCC) 11/17/2020   Hyperlipidemia associated with type 2 diabetes mellitus (HCC) 08/21/2020   Iron  deficiency 06/28/2020   Elevated alkaline phosphatase level 06/10/2020   Chronic heart failure with preserved ejection fraction (HFpEF) (HCC) 01/14/2020   Coronary artery disease of native artery of native heart with stable angina pectoris (HCC) 01/13/2020   S/P cardiac catheterization 01/13/2020   Accelerating angina (HCC) 12/31/2019   Dysphagia 11/30/2019   Bladder pain 08/23/2019   Gastritis 08/07/2019   Lab test negative for COVID-19 virus 08/07/2019   S/P panniculectomy 06/16/2019   Major depressive disorder with current active episode 05/30/2019   Functional diarrhea 10/22/2018   Bilateral cataracts 06/26/2018   History of gastric bypass 02/05/2018   GAD (generalized anxiety disorder) 02/01/2018   Rectocele 01/24/2018   Vaginal prolapse 01/04/2018   Hepatic steatosis 01/04/2018   Microalbuminuria due to type 2 diabetes mellitus (HCC) 11/21/2017   Hospital discharge follow-up 09/22/2017   Fecal incontinence 09/22/2017   B12 deficiency 09/22/2017   Rotator cuff arthropathy, right 08/13/2017  Primary localized osteoarthrosis of shoulder 08/13/2017   Encounter for preoperative examination for general surgical procedure 08/03/2017   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) 08/03/2017   Candidiasis, intertrigo 08/03/2017   Venous stasis dermatitis of right lower extremity 11/21/2016   Chronic anxiety 10/11/2015   Hypersomnia, persistent 06/18/2015   Mechanical breakdown of implanted electronic neurostimulator of peripheral nerve (HCC) 10/16/2014   Obesity hypoventilation syndrome (HCC) 08/13/2014   Frequent falls 06/23/2014   Chronic cough 05/05/2014   Adenoma of left adrenal gland 03/24/2014   Chronic  intractable headache 03/23/2014   Syncope and collapse 03/12/2014   Vitamin D  deficiency 01/06/2014   Rectal bleeding 12/03/2013   Weight gain following gastric bypass surgery 12/03/2013   Morbid obesity (HCC) 08/25/2013   Weakness 08/04/2013   Diaphragmatic hernia 04/06/2013   Diabetic polyneuropathy (HCC) 03/25/2013   Abnormality of gait 03/25/2013   Multiple pulmonary nodules 03/20/2013   DOE (dyspnea on exertion) 03/20/2013   Iron  deficiency anemia 08/06/2012   Functional disorder of bladder 07/15/2012   Urge incontinence 07/15/2012   Detrusor overactivity 07/15/2012   OSA on CPAP 03/02/2012   Insomnia secondary to anxiety 02/29/2012   Sciatica 09/05/2011   Cervical stenosis of spinal canal 06/14/2011   Restless legs syndrome 03/13/2011   Type II diabetes mellitus with neurological manifestations (HCC) 03/12/2011   Hearing loss, right 03/12/2011    Past Medical History:  Diagnosis Date   (HFpEF) heart failure with preserved ejection fraction (HCC)    a. 12/2019 Echo: EF 65-70%, Gr2 DD. No significant valvular dzs.   Abnormality of gait 03/25/2013   Adrenal mass, left (HCC)    Anginal pain (HCC)    Anxiety    Aortic atherosclerosis (HCC)    Arthritis    Asthma    CAD (coronary artery disease) with h/o Atypical Chest Pain    a. 04/1986 Cath ([facility]): nl cors, EF 65%; b. 07/2012 Cath [facility]): Diff minor irregs; c. 07/2016 [facility]): Equivocal; d. 08/2016 Cardiac CT Ca2+ score [facility]): Ca2+ 1548; e. 05/2019 [facility]: No ischemia. EF 75%; f. 12/2019 PCI: LM nl, LAD 65ost (3.5x12 Resolute DES), 67m (2.75x12 Resolute DES),LCX/RCA nl; g. 08/2020 Cath: patent LAD stents, RCA 40ost, elev RH pressures, EF >65%.   Cervical spinal stenosis 1994   due to trauma to back ([employer] accident), has intermittent paralysis and parasthesias   Cervicogenic headache 03/23/2014   CKD (chronic kidney disease), stage III (HCC)    Depression    Diverticulosis    Dizziness    a.) chronic   DJD (degenerative  joint disease)    a. Chronic R shoulder pain   Dyspnea    Esophageal stenosis 09/2009   a.) transient outlet obstruction by food, cleared by EGD   Family history of adverse reaction to anesthesia    a.) daughter with (+) PONV   Gastric bypass status for obesity    GERD (gastroesophageal reflux disease)    Gout    Headache(784.0)    HLD (hyperlipidemia)    Hypertension    IBS (irritable bowel syndrome)    IDA (iron  deficiency anemia)    a.) post 2 unit txfsn 2009, normal endo/colonoscopy by [facility]   Left bundle branch block (LBBB)    a.) Intermittently present - likely rate related.   NSVT (nonsustained ventricular tachycardia) (HCC) 11/15/2020   a.) Holter study 11/15/2020; 11 episodes of NSVT lasting up to 8 beats with a maximum rate of 210 bpm; 14 atrial runs lasting up to 18 beats  with a rate of up to 250 bpm.  Some atrial runs felt to be NSVT.   NSVT (nonsustained ventricular tachycardia) (HCC)    a. 10/2020 [facility]: 11 runs of NSVT up to 8 beats.   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 10/2020 [facility]: 14 atrial runs up to 18 beats, max HR 250.   Restless leg syndrome    Rotator cuff arthropathy, right 08/13/2017   Syncope and collapse 03/12/2014   Type II diabetes mellitus (HCC)     Family History  Problem Relation Age of Onset   Heart disease Father    Hypertension Father    Prostate cancer Father    Stroke Father    Osteoporosis Father    Stroke Mother    Depression Mother    Headache Mother    Heart disease Mother    Thyroid  disease Mother    Hypertension Mother    Diabetes Daughter    Heart disease Daughter    Hypertension Daughter    Hypertension Son    Past Surgical History:  Procedure Laterality Date   APPENDECTOMY     BACK SURGERY     CARDIOVASCULAR STRESS TEST     CARDIOVERSION N/A 10/30/2022   Procedure:  CARDIOVERSION;  Surgeon: Mady Bruckner, MD;  Location: [facility];  Service: Cardiovascular;  Laterality: N/A;   CHOLECYSTECTOMY N/A 1976   COLONOSCOPY     CORONARY ANGIOPLASTY     CORONARY STENT INTERVENTION N/A 01/04/2020   Procedure: CORONARY STENT INTERVENTION;  Surgeon: Darron Deatrice LABOR, MD;  Location: [facility];  Service: Cardiovascular;  Laterality: N/A;  LAD    DIAPHRAGMATIC HERNIA REPAIR  2015   ESOPHAGEAL DILATION     multiple   ESOPHAGOGASTRODUODENOSCOPY (EGD) WITH PROPOFOL  N/A 05/11/2021   Procedure: ESOPHAGOGASTRODUODENOSCOPY (EGD) WITH PROPOFOL ;  Surgeon: Jinny Carmine, MD;  Location: [facility];  Service: Endoscopy;  Laterality: N/A;   ESOPHAGOGASTRODUODENOSCOPY (EGD) WITH PROPOFOL  N/A 06/13/2021   Procedure: ESOPHAGOGASTRODUODENOSCOPY (EGD) WITH PROPOFOL ;  Surgeon: Jinny Carmine, MD;  Location: [facility];  Service: Endoscopy;  Laterality: N/A;   ESOPHAGOGASTRODUODENOSCOPY (EGD) WITH PROPOFOL  N/A 02/28/2023   Procedure: ESOPHAGOGASTRODUODENOSCOPY (EGD) WITH PROPOFOL ;  Surgeon: Jinny Carmine, MD;  Location: [facility];  Service: Endoscopy;  Laterality: N/A;   EYE SURGERY     GASTRIC BYPASS  2000, 2005   Dr. Mia REDBIRD IMPLANT PLACEMENT  10/2011   Ike REDBIRD IMPLANT PLACEMENT N/A 09/18/2021   Procedure: REDBIRD IMPLANT FIRST STAGE;  Surgeon: Gaston Hamilton, MD;  Location: [facility];  Service: Urology;  Laterality: N/A;   INTERSTIM IMPLANT PLACEMENT N/A 09/18/2021   Procedure: REDBIRD IMPLANT SECOND STAGE WITH IMPEDENCE CHECK;  Surgeon: Gaston Hamilton, MD;  Location: [facility];  Service: Urology;  Laterality: N/A;   INTERSTIM IMPLANT REMOVAL N/A 09/18/2021   Procedure: REMOVAL OF INTERSTIM IMPLANT;  Surgeon: Gaston Hamilton, MD;  Location: [facility];  Service: Urology;  Laterality: N/A;   LEFT HEART CATH AND CORONARY ANGIOGRAPHY N/A 04/1986   LEFT HEART CATH AND CORONARY ANGIOGRAPHY Left 07/24/2012   LEFT HEART CATH AND  CORS/GRAFTS ANGIOGRAPHY N/A 12/31/2019   Procedure: LEFT HEART CATH AND CORS/GRAFTS ANGIOGRAPHY;  Surgeon: Perla Evalene PARAS, MD;  Location: [facility];  Service: Cardiovascular;  Laterality: N/A;   PANNICULECTOMY N/A 06/16/2019   Procedure: PANNICULECTOMY;  Surgeon: Elisabeth Coombs  S, MD;  Location: [facility];  Service: Plastics;  Laterality: N/A;  3 hours, please   REVERSE SHOULDER ARTHROPLASTY Right 08/13/2017   Procedure: REVERSE RIGHT SHOULDER ARTHROPLASTY;  Surgeon: Josefina Chew, MD;  Location: [facility];  Service: Orthopedics;  Laterality: Right;   RIGHT/LEFT HEART CATH AND CORONARY ANGIOGRAPHY N/A 09/06/2020   Procedure: RIGHT/LEFT HEART CATH AND CORONARY ANGIOGRAPHY;  Surgeon: Mady Bruckner, MD;  Location: [facility];  Service: Cardiovascular;  Laterality: N/A;   RIGHT/LEFT HEART CATH AND CORONARY ANGIOGRAPHY N/A 06/04/2022   Procedure: RIGHT/LEFT HEART CATH AND CORONARY ANGIOGRAPHY;  Surgeon: Mady Bruckner, MD;  Location: [facility];  Service: Cardiovascular;  Laterality: N/A;   ROTATOR CUFF REPAIR Right    SPINE SURGERY  1995   [place]   TOOTH EXTRACTION  11/13/2021   TOTAL ABDOMINAL HYSTERECTOMY W/ BILATERAL SALPINGOOPHORECTOMY  1974   TOTAL KNEE ARTHROPLASTY Bilateral 2007   Procedure: TOTAL KNEE ARTHROPLASTY; Surgeons: Kathi, MD and [name], MD   UMBILICAL HERNIA REPAIR  08/11/2015   Social History   Social History Narrative   Patient lives at home with husband Koren.    Right Handed   Drinks caffeinated tea occasionally   One pet in house, dog   Immunization History  Administered Date(s) Administered   Fluad Quad(high Dose 65+) 03/06/2019, 06/07/2020, 07/27/2021, 07/13/2022   Fluad Trivalent(High Dose 65+) 03/29/2023   Influenza Split 06/13/2011   Influenza, High Dose Seasonal PF 08/21/2018   Influenza,inj,Quad PF,6+ Mos 05/04/2014, 05/12/2015   Influenza-Unspecified 04/15/2012   Moderna Sars-Covid-2 Vaccination 02/02/2020, 03/01/2020,  09/01/2020   Pneumococcal Conjugate-13 08/11/2013, 01/06/2014   Pneumococcal Polysaccharide-23 01/19/2010, 09/03/2018   Tdap 04/18/2016, 11/20/2020     Objective: Vital Signs: BP 106/71 (BP Location: Left Arm, Patient Position: Sitting, Cuff Size: Normal)   Pulse 79   Resp 14   Ht 5' 4.5 (1.638 m)   BMI 41.13 kg/m    Physical Exam Constitutional:      Appearance: She is obese.  Eyes:     Conjunctiva/sclera: Conjunctivae normal.  Cardiovascular:     Rate and Rhythm: Normal rate and regular rhythm.  Pulmonary:     Effort: Pulmonary effort is normal.     Breath sounds: Normal breath sounds.  Musculoskeletal:     Comments: Trace pedal edema  Lymphadenopathy:     Cervical: No cervical adenopathy.  Skin:    General: Skin is warm and dry.  Neurological:     Mental Status: She is alert.     Comments: Severely diminished sensation to pressure throughout shin and feet  Psychiatric:        Mood and Affect: Mood normal.      Musculoskeletal Exam:  Neck full ROM no tenderness Shoulders full ROM no tenderness or swelling Elbows full ROM no tenderness or swelling Wrists full ROM no tenderness or swelling Fingers with bilateral DIP Heberden's nodes, right second PIP joint with erythema swelling and tenderness to pressure Knees full ROM postoperative changes Left ankle and foot with diffuse erythema and warmth more pronounced on the lateral side, there is negligible ankle range of motion   Investigation: No additional findings.  Imaging: CT FOOT LEFT WO CONTRAST Result Date: 01/29/2024 CLINICAL DATA:  Bone mass or bone pain, foot, benign features on xray Status post fall. Acquired pes planovalgus of left foot. Charcot foot due to diabetes mellitus. EXAM: CT OF THE LEFT FOOT WITHOUT CONTRAST TECHNIQUE: Multidetector CT imaging of the left foot was performed according to the standard protocol. Multiplanar CT  image reconstructions were also generated. RADIATION DOSE REDUCTION: This  exam was performed according to the departmental dose-optimization program which includes automated exposure control, adjustment of the mA and/or kV according to patient size and/or use of iterative reconstruction technique. COMPARISON:  Radiographs 11/27/2023 and 10/14/2017. MRI of the left forefoot 05/07/2008. FINDINGS: Bones/Joint/Cartilage No definite acute osseous findings. Stable postsurgical changes from remote resection of the distal 5th metatarsal. The 5th toe remains intact. Stable mild clawtoe deformities of the 2nd through 5th digits. Mild degenerative changes at the 1st metatarsophalangeal joint and advanced degenerative changes at the 3rd metatarsophalangeal joint, similar to recent radiographs. Severe chronic Charcot changes are again noted throughout the midfoot with chronic fragmentation and destruction of the cuneiform bones, cuboid, navicular, talus and calcaneus. Alignment at the Lisfranc joint is relatively maintained, although there is chronic dorsal and medial dislocation at the transverse tarsal joint. Relatively mild tibiotalar and subtalar degenerative changes. There is diffuse synovial thickening throughout the hindfoot and midfoot. Ligaments Suboptimally assessed by CT. Muscles and Tendons Intact Achilles tendon. Possible chronic impingement on the medial flexor and peroneal tendons by the Charcot changes. No gross tendon rupture identified by CT. Generalized muscular atrophy. Soft tissues Generalized soft tissue swelling with diffuse synovial thickening throughout the hindfoot and midfoot. As above, diffuse osseous fragmentation with heterotopic ossification. No organized fluid collection, foreign body or soft tissue emphysema identified. There are possible pressure lesions plantar to the 1st metatarsophalangeal joint and remnant of the 5th metatarsal, without obvious overlying soft tissue ulceration. IMPRESSION: 1. No definite acute osseous findings. 2. Severe chronic Charcot changes  throughout the midfoot with chronic fragmentation and destruction of the cuneiform bones, cuboid, navicular, talus and calcaneus. 3. 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 4. Generalized soft tissue swelling with diffuse synovial thickening throughout the hindfoot and midfoot. No organized fluid collection, foreign body or soft tissue emphysema identified. 5. Stable postsurgical changes from remote resection of the distal 5th metatarsal. 6. Stable degenerative changes at the 1st and 3rd metatarsophalangeal joints. Electronically Signed   By: Elsie Perone M.D.   On: 01/29/2024 12:37    Recent Labs: Lab Results  Component Value Date   WBC 10.5 02/01/2024   HGB 12.1 02/01/2024   PLT 461 (H) 02/01/2024   NA 137 02/03/2024   K 4.2 02/03/2024   CL 99 02/03/2024   CO2 25 02/03/2024   GLUCOSE 165 (H) 02/03/2024   BUN 44 (H) 02/03/2024   CREATININE 1.43 (H) 02/03/2024   BILITOT 0.7 02/01/2024   ALKPHOS 142 (H) 02/01/2024   AST 21 02/01/2024   ALT 18 02/01/2024   PROT 6.8 02/01/2024   ALBUMIN  2.6 (L) 02/01/2024   CALCIUM  8.6 (L) 02/03/2024   GFRAA >60 03/23/2020    Speciality Comments: Immediate meds- 30 day to [facility] Long term meds- [facility]  Procedures:  No procedures performed Allergies: Amoxicillin , Clarithromycin , Demeclocycline, Erythromycin, Flagyl [metronidazole], Glucophage [metformin hcl], Tetracyclines & related, Potassium chloride , Tetracycline, Diovan [valsartan], Sulfa  antibiotics, and Xanax [alprazolam]   Assessment / Plan:     Visit Diagnoses: Acute gout due to renal impairment involving toe of left foot - Plan: XR Hand 2 View Right, Sedimentation rate, C-reactive protein, Uric acid, Rheumatoid factor, CBC with Differential/Platelet, Comprehensive metabolic panel with GFR, allopurinol  (ZYLOPRIM ) 100 MG tablet Chronic gout with elevated uric acid due to chronic kidney disease and loop diuretics. Colchicine  ineffective, causing diarrhea.  Differential includes persistent gout versus other inflammatory arthritis. -Recheck uric acid level today, with consideration could  be suppressed in the setting of acute disease activity - Start allopurinol  100 mg once daily, monitor for hypersensitivity and drug interactions. - Follow-up in 1 month for repeat labs and dose titration - Okay to continue colchicine  as needed for inflammation, low threshold to resume another prednisone  taper if symptoms unmanageable - Can pursue anakinra or Ilaris approval for management if she cannot tolerate steroids but often not very accessible  Gouty arthritis of hand - Plan: XR Hand 2 View Right, Sedimentation rate, C-reactive protein, Uric acid, Rheumatoid factor, CBC with Differential/Platelet, Comprehensive metabolic panel with GFR, allopurinol  (ZYLOPRIM ) 100 MG tablet Finger joint swelling also look suspicious for gouty arthritis.  No overlying skin breaks for local site of infection or inflammation.  No distal finger changes.  X-ray shows moderate degenerative arthritis more in the thumb than elsewhere in the hand no acute abnormality  Diabetic polyneuropathy associated with type 2 diabetes mellitus (HCC) Degenerative Arthritis Long-standing degenerative arthritis affecting multiple joints, contributing to joint pain and swelling.  Peripheral neuropathy with loss of sensation from knees down, contributing to ambulation difficulty and increased injury risk. Severe pain reported despite lack of sensation. Significant functional impairment, using mobility aids.  Foot changes look concerning for collapse of midfoot more related to Charcot foot disease versus acute fracture or chronic inflammatory arthritis.  Has upcoming additional imaging and podiatry follow-up.  Chronic Kidney Disease, Stage 3 Stage 3 chronic kidney disease contributing to elevated uric acid levels and gout. Loop diuretics exacerbate gout but necessary for maintenance of congestive heart  failure.  We discussed that is okay to start allopurinol  but will have to start at a relatively low dose and titrate not fashion the 100 mg daily for adjustment.  Orders: Orders Placed This Encounter  Procedures   XR Hand 2 View Right   Sedimentation rate   C-reactive protein   Uric acid   Rheumatoid factor   CBC with Differential/Platelet   Comprehensive metabolic panel with GFR   Meds ordered this encounter  Medications   allopurinol  (ZYLOPRIM ) 100 MG tablet    Sig: Take 1 tablet (100 mg total) by mouth daily.    Dispense:  30 tablet    Refill:  1     Follow-Up Instructions: Return in about 4 weeks (around 02/21/2024) for New pt gout allopurinol  start f/u 40mo.   Lonni LELON Ester, MD  Note - This record has been created using [employer].  Chart creation errors have been sought, but may not always  have been located. Such creation errors do not reflect on  the standard of medical care.

## 2024-01-24 NOTE — Patient Instructions (Signed)
 Allopurinol Tablets What is this medication? ALLOPURINOL (al oh PURE i nole) treats gout or kidney stones. It may also be used to prevent high uric acid levels after chemotherapy. It works by decreasing uric acid levels in your body. This medicine may be used for other purposes; ask your health care provider or pharmacist if you have questions. COMMON BRAND NAME(S): Zyloprim What should I tell my care team before I take this medication? They need to know if you have any of these conditions: Kidney disease Liver disease An unusual or allergic reaction to allopurinol, other medications, foods, dyes, or preservatives Pregnant or trying to get pregnant Breast-feeding How should I use this medication? Take this medication by mouth with a full glass of water. Take it as directed on the prescription label at the same time every day. You can take it with or without food. If it upsets your stomach, take it with food. Keep taking it unless your care team tells you to stop. Talk to your care team about the use of this medication in children. While this medication may be prescribed for children for selected conditions, precautions do apply. Overdosage: If you think you have taken too much of this medicine contact a poison control center or emergency room at once. NOTE: This medicine is only for you. Do not share this medicine with others. What if I miss a dose? If you miss a dose, take it as soon as you can. If it is almost time for your next dose, take only that dose. Do not take double or extra doses. What may interact with this medication? Do not take this medication with the following: Didanosine, ddI This medication may also interact with the following: Certain antibiotics like amoxicillin, ampicillin Certain medications for cancer Certain medications for immunosuppression like azathioprine, cyclosporine, mercaptopurine Chlorpropamide Probenecid Sulfinpyrazone Thiazide diuretics, like  hydrochlorothiazide Warfarin This list may not describe all possible interactions. Give your health care provider a list of all the medicines, herbs, non-prescription drugs, or dietary supplements you use. Also tell them if you smoke, drink alcohol, or use illegal drugs. Some items may interact with your medicine. What should I watch for while using this medication? Visit your care team for regular checks on your progress. If you are taking this medication to treat gout, you may not have less frequent attacks at first. Keep taking your medication regularly and the attacks should get better within 2 to 6 weeks. Drink plenty of water (10 to 12 full glasses a day) while you are taking this medication. This will help to reduce stomach upset and reduce the risk of getting gout or kidney stones. This medication may cause serious skin reactions. They can happen weeks to months after starting the medication. Contact your care team right away if you notice fevers or flu-like symptoms with a rash. The rash may be red or purple and then turn into blisters or peeling of the skin. You may also notice a red rash with swelling of the face, lips, or lymph nodes in your neck or under your arms. Do not take vitamin C without asking your care team. Too much vitamin C can increase the chance of getting kidney stones. This medication may affect your coordination, reaction time, or judgment. Do not drive or operate machinery until you know how this medication affects you. Sit up or stand slowly to reduce the risk of dizzy or fainting spells. Drinking alcohol with this medication can increase the risk of these side effects. What  side effects may I notice from receiving this medication? Side effects that you should report to your care team as soon as possible: Allergic reactions--skin rash, itching, hives, swelling of the face, lips, tongue, or throat Kidney injury--decrease in the amount of urine, swelling of the ankles, hands,  or feet Liver injury--right upper belly pain, loss of appetite, nausea, light-colored stool, dark yellow or brown urine, yellowing skin or eyes, unusual weakness or fatigue Rash, fever, and swollen lymph nodes Redness, blistering, peeling, or loosening of the skin, including inside the mouth Side effects that usually do not require medical attention (report to your care team if they continue or are bothersome): Diarrhea Drowsiness Nausea Vomiting This list may not describe all possible side effects. Call your doctor for medical advice about side effects. You may report side effects to FDA at 1-800-FDA-1088. Where should I keep my medication? Keep out of the reach of children and pets. Store at room temperature between 15 and 25 degrees C (59 and 77 degrees F). Protect from light and moisture. Throw away any unused medication after the expiration date. NOTE: This sheet is a summary. It may not cover all possible information. If you have questions about this medicine, talk to your doctor, pharmacist, or health care provider.  2024 [employer]/Gold Standard (2022-01-19 00:00:00)

## 2024-01-25 LAB — CBC WITH DIFFERENTIAL/PLATELET
Absolute Lymphocytes: 3114 {cells}/uL (ref 850–3900)
Absolute Monocytes: 851 {cells}/uL (ref 200–950)
Basophils Absolute: 78 {cells}/uL (ref 0–200)
Basophils Relative: 0.7 %
Eosinophils Absolute: 168 {cells}/uL (ref 15–500)
Eosinophils Relative: 1.5 %
HCT: 38.7 % (ref 35.0–45.0)
Hemoglobin: 11.9 g/dL (ref 11.7–15.5)
MCH: 28.6 pg (ref 27.0–33.0)
MCHC: 30.7 g/dL — ABNORMAL LOW (ref 32.0–36.0)
MCV: 93 fL (ref 80.0–100.0)
MPV: 12 fL (ref 7.5–12.5)
Monocytes Relative: 7.6 %
Neutro Abs: 6989 {cells}/uL (ref 1500–7800)
Neutrophils Relative %: 62.4 %
Platelets: 336 Thousand/uL (ref 140–400)
RBC: 4.16 Million/uL (ref 3.80–5.10)
RDW: 15.5 % — ABNORMAL HIGH (ref 11.0–15.0)
Total Lymphocyte: 27.8 %
WBC: 11.2 Thousand/uL — ABNORMAL HIGH (ref 3.8–10.8)

## 2024-01-25 LAB — COMPREHENSIVE METABOLIC PANEL WITH GFR
AG Ratio: 1.2 (calc) (ref 1.0–2.5)
ALT: 16 U/L (ref 6–29)
AST: 14 U/L (ref 10–35)
Albumin: 3.7 g/dL (ref 3.6–5.1)
Alkaline phosphatase (APISO): 204 U/L — ABNORMAL HIGH (ref 37–153)
BUN/Creatinine Ratio: 12 (calc) (ref 6–22)
BUN: 15 mg/dL (ref 7–25)
CO2: 22 mmol/L (ref 20–32)
Calcium: 8.6 mg/dL (ref 8.6–10.4)
Chloride: 106 mmol/L (ref 98–110)
Creat: 1.3 mg/dL — ABNORMAL HIGH (ref 0.60–1.00)
Globulin: 3.2 g/dL (ref 1.9–3.7)
Glucose, Bld: 162 mg/dL — ABNORMAL HIGH (ref 65–99)
Potassium: 4.8 mmol/L (ref 3.5–5.3)
Sodium: 141 mmol/L (ref 135–146)
Total Bilirubin: 0.5 mg/dL (ref 0.2–1.2)
Total Protein: 6.9 g/dL (ref 6.1–8.1)
eGFR: 42 mL/min/1.73m2 — ABNORMAL LOW (ref >=60)

## 2024-01-25 LAB — URIC ACID: Uric Acid, Serum: 7.8 mg/dL — ABNORMAL HIGH (ref 2.5–7.0)

## 2024-01-25 LAB — SEDIMENTATION RATE: Sed Rate: 80 mm/h — ABNORMAL HIGH (ref 0–30)

## 2024-01-25 LAB — RHEUMATOID FACTOR: Rheumatoid fact SerPl-aCnc: <10 [IU]/mL (ref <14)

## 2024-01-25 LAB — C-REACTIVE PROTEIN: CRP: 89.8 mg/L — ABNORMAL HIGH (ref <8.0)

## 2024-01-27 ENCOUNTER — Ambulatory Visit: Attending: Cardiology | Admitting: Cardiology

## 2024-01-27 ENCOUNTER — Encounter: Payer: Self-pay | Admitting: Cardiology

## 2024-01-27 ENCOUNTER — Telehealth: Payer: Self-pay

## 2024-01-27 VITALS — BP 128/52 | HR 86

## 2024-01-27 DIAGNOSIS — I4819 Other persistent atrial fibrillation: Secondary | ICD-10-CM | POA: Insufficient documentation

## 2024-01-27 DIAGNOSIS — I2510 Atherosclerotic heart disease of native coronary artery without angina pectoris: Secondary | ICD-10-CM | POA: Insufficient documentation

## 2024-01-27 DIAGNOSIS — E1122 Type 2 diabetes mellitus with diabetic chronic kidney disease: Secondary | ICD-10-CM | POA: Diagnosis not present

## 2024-01-27 DIAGNOSIS — I5032 Chronic diastolic (congestive) heart failure: Secondary | ICD-10-CM | POA: Diagnosis not present

## 2024-01-27 DIAGNOSIS — Z955 Presence of coronary angioplasty implant and graft: Secondary | ICD-10-CM | POA: Diagnosis not present

## 2024-01-27 DIAGNOSIS — I480 Paroxysmal atrial fibrillation: Secondary | ICD-10-CM | POA: Diagnosis not present

## 2024-01-27 DIAGNOSIS — Z7984 Long term (current) use of oral hypoglycemic drugs: Secondary | ICD-10-CM | POA: Insufficient documentation

## 2024-01-27 DIAGNOSIS — N1832 Chronic kidney disease, stage 3b: Secondary | ICD-10-CM | POA: Diagnosis not present

## 2024-01-27 DIAGNOSIS — G4733 Obstructive sleep apnea (adult) (pediatric): Secondary | ICD-10-CM | POA: Diagnosis not present

## 2024-01-27 DIAGNOSIS — Z7901 Long term (current) use of anticoagulants: Secondary | ICD-10-CM | POA: Diagnosis not present

## 2024-01-27 DIAGNOSIS — Z79899 Other long term (current) drug therapy: Secondary | ICD-10-CM | POA: Diagnosis not present

## 2024-01-27 DIAGNOSIS — I130 Hypertensive heart and chronic kidney disease with heart failure and stage 1 through stage 4 chronic kidney disease, or unspecified chronic kidney disease: Secondary | ICD-10-CM | POA: Insufficient documentation

## 2024-01-27 DIAGNOSIS — R600 Localized edema: Secondary | ICD-10-CM | POA: Diagnosis not present

## 2024-01-27 DIAGNOSIS — N183 Chronic kidney disease, stage 3 unspecified: Secondary | ICD-10-CM | POA: Diagnosis not present

## 2024-01-27 DIAGNOSIS — D509 Iron deficiency anemia, unspecified: Secondary | ICD-10-CM | POA: Diagnosis not present

## 2024-01-27 DIAGNOSIS — I428 Other cardiomyopathies: Secondary | ICD-10-CM | POA: Insufficient documentation

## 2024-01-27 DIAGNOSIS — I5030 Unspecified diastolic (congestive) heart failure: Secondary | ICD-10-CM | POA: Diagnosis present

## 2024-01-27 MED ORDER — TORSEMIDE 20 MG PO TABS: 20.0000 mg | ORAL_TABLET | Freq: Every day | ORAL | 3 refills | Status: DC

## 2024-01-27 NOTE — Progress Notes (Signed)
 [facility] Note    PCP: Marylynn Verneita CROME, MD (last seen 03/25) Cardiologist: Lonni Hanson, MD / Franchester Mail, PA (last seen 04/25) HF provider: Gardenia Led, MD (last seen 12/24)  Chief Complaint: HFpEF  HPI:  Denise Sharp is a 78 y/o female with a history of HFpEF, CAD status post PCI to the LAD, persistent atrial fibrillation, type 2 diabetes, CKD and morbid obesity presenting today for follow up of chronic diastolic heart failure.  Echo 03/25/12: EF 55-65% with mild AR Echo 08/21/17: EF 65-70% with Grade II DD and mildly elevated PA pressure of 33 mmHg Echo 12/30/19: EF 65-70% with Grade II DD  RHC/ LHC 06/04/22: Mild-moderate, non-obstructive coronary artery disease, similar to prior catheterization in 08/2020. Widely patent ostial LAD stent. Patent mid LAD stent with minimal in stent restenosis (~10% narrowing). Upper normal left and right heart filling pressures. Borderline elevated pulmonary artery pressure. Normal to mildly reduced cardiac output/index.  Admitted 03/28/23 due to 30 pound weight gain requiring IV diuresis.  At that time she was taking Lasix  20 mg once daily to once every other day. She was admitted to [facility] where echocardiogram demonstrated preserved EF with grade 2 diastolic dysfunction. She was diuresed 4 L through admission and transition to torsemide  20 mg daily at discharge. Throughout admission she maintained normal sinus rhythm. She did have rectal bleeding that was secondary to hemorrhoids.  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     Vitals:   01/27/24 1340  BP: (!) 128/52  Pulse: 86  SpO2: 99%   Wt Readings from Last 3 Encounters:  01/09/24 243 lb 6.4 oz (110.4 kg)  01/07/24 248 lb (112.5 kg)  12/31/23 240 lb (108.9 kg)   Lab Results  Component Value Date   CREATININE 1.30 (H) 01/24/2024   CREATININE 1.32 (H) 11/28/2023   CREATININE 1.69 (H) 11/06/2023    PHYSICAL EXAM: Vitals:    01/27/24 1340  BP: (!) 128/52  Pulse: 86  SpO2: 99%   GENERAL: NAD Lungs- CTA CARDIAC:  JVP: 7 cm          Normal rate with regular rhythm. no murmur.  Pulses 2+. no edema.  ABDOMEN: Soft, non-tender, non-distended.  EXTREMITIES: Warm and well perfused.  NEUROLOGIC: No obvious FND   ECG: Not done   ASSESSMENT & PLAN:  1: NICM with preserved ejection fraction- - suspect due to obesity, atrial fibrillation, vascular disease, hypertension, CKD - NYHA class III: symptoms 2/2 deconditioning as well - appear near euvolemic - Echo 08/21/17: EF 65-70% with Grade II DD and mildly elevated PA pressure of 33 mmHg - Echo 12/30/19: EF 65-70% with Grade II DD - Echo 03/29/23: EF 60-65% with mild LVH, Grade II DD - continue farxiga  10mg  daily - continue finerenone  10mg  daily - Currently reports taking torsemide  40mg  daily; would like to reduce dose. We will come down to 20mg  daily. Extensively counseled on decreased fluid intake.  - continue potassium 20mEq daily  2: HTN- - BP 123/59 - saw PCP Denise Sharp) 03/25 - BMET 5/15 reviewed, elevated glucose, otherwise creatinine stable, BUN improving - reviewed CMP from 01/24/24: sCr 1.30; stable.   3: PAF- - saw cardiology Denise Sharp) 04/25 - continue apixaban  5mg  BID.   4: OSA- - had video visit with pulmonology Denise Sharp) 10/24 - wearing CPAP nightly  5: Left leg edema- - started on allopurinol  now; following with rheumatology. CT scan pending.   6: Iron  deficiency anemia- - saw hematology Denise Sharp)  09/24 - hemoglobin 04/08/23 was 14.2  7: DM- - saw endocrinology Denise Sharp) 03/25 - A1c 09/30/23 was 9.3% - patient says that glucose is continuing to run high  8. CKD IIIB - With underlying T2DM, HFpEF will continue finerenone  10mg .  - saw nephrology (Denise Sharp) 03/25  9. Deconditioning - she did not qualify for pulmonary rehab due to multiple orthopaedic issues along with severe incontinence - not doing any PT yet  I spent 30 minutes  caring for this patient today including face to face time, ordering and reviewing labs, reviewing records/labs noted above, seeing the patient, documenting in the record, and arranging follow ups.   Denise Sharp 4:21 PM

## 2024-01-27 NOTE — Telephone Encounter (Signed)
 Patient called the office to advise that her entire right hand is swollen now vs just the pointer finger. Patient states that she can feel the heat in the hand an can not been the fingers at this point. She seen her cardiologist this morning who recommended she let Dr Jeannetta know about the issue. She states she is not sure if its broken or not but Xrays were taking at last visit.

## 2024-01-27 NOTE — Patient Instructions (Signed)
 Medication Changes:  DECREASE Torsemide  20mg  (1 tab) dail   Follow-Up in: Please follow up with the [facility] in 4 months with Ellouise Class, FNP.  At the [facility], you and your health needs are our priority. We have a designated team specialized in the treatment of Heart Failure. This Care Team includes your primary Heart Failure Specialized Cardiologist (physician), Advanced Practice Providers (APPs- Physician Assistants and Nurse Practitioners), and Pharmacist who all work together to provide you with the care you need, when you need it.   You may see any of the following providers on your designated Care Team at your next follow up:  Dr. Toribio Fuel Dr. Ezra Shuck Dr. Ria Commander Dr. Odis Brownie Ellouise Class, FNP Jaun Bash, RPH-CPP  Please be sure to bring in all your medications bottles to every appointment.   Need to Contact Us :  If you have any questions or concerns before your next appointment please send us  a message through [place] or call our office at [phone].    TO LEAVE A MESSAGE FOR THE NURSE SELECT OPTION 2, PLEASE LEAVE A MESSAGE INCLUDING: YOUR NAME DATE OF BIRTH CALL BACK NUMBER REASON FOR CALL**this is important as we prioritize the call backs  YOU WILL RECEIVE A CALL BACK THE SAME DAY AS LONG AS YOU CALL BEFORE 4:00 PM

## 2024-01-28 NOTE — Telephone Encounter (Signed)
 Patient has contacted the office requesting a call back with x-ray results and follow up about what she should do about her hand.

## 2024-01-30 ENCOUNTER — Ambulatory Visit: Payer: Self-pay | Admitting: Podiatry

## 2024-01-30 NOTE — Progress Notes (Signed)
 The patient is very frustrated due to the delay in receiving her MRI results. Her appointment is scheduled for 02/10/2024 at 10:30 AM at [facility].

## 2024-01-31 ENCOUNTER — Other Ambulatory Visit: Payer: Self-pay

## 2024-01-31 ENCOUNTER — Observation Stay
Admission: EM | Admit: 2024-01-31 | Discharge: 2024-02-03 | Disposition: A | Discharge status: Home / Self Care | Source: Ambulatory Visit | Attending: Internal Medicine | Admitting: Internal Medicine

## 2024-01-31 DIAGNOSIS — L03011 Cellulitis of right finger: Secondary | ICD-10-CM | POA: Diagnosis not present

## 2024-01-31 DIAGNOSIS — I482 Chronic atrial fibrillation, unspecified: Secondary | ICD-10-CM | POA: Diagnosis not present

## 2024-01-31 DIAGNOSIS — E1122 Type 2 diabetes mellitus with diabetic chronic kidney disease: Secondary | ICD-10-CM | POA: Insufficient documentation

## 2024-01-31 DIAGNOSIS — E1149 Type 2 diabetes mellitus with other diabetic neurological complication: Secondary | ICD-10-CM | POA: Diagnosis present

## 2024-01-31 DIAGNOSIS — L03113 Cellulitis of right upper limb: Secondary | ICD-10-CM | POA: Diagnosis not present

## 2024-01-31 DIAGNOSIS — M109 Gout, unspecified: Secondary | ICD-10-CM | POA: Diagnosis present

## 2024-01-31 DIAGNOSIS — J189 Pneumonia, unspecified organism: Secondary | ICD-10-CM | POA: Insufficient documentation

## 2024-01-31 DIAGNOSIS — N183 Chronic kidney disease, stage 3 unspecified: Secondary | ICD-10-CM | POA: Diagnosis not present

## 2024-01-31 DIAGNOSIS — Z794 Long term (current) use of insulin: Secondary | ICD-10-CM | POA: Insufficient documentation

## 2024-01-31 DIAGNOSIS — L0390 Cellulitis, unspecified: Secondary | ICD-10-CM | POA: Diagnosis present

## 2024-01-31 DIAGNOSIS — M79641 Pain in right hand: Secondary | ICD-10-CM | POA: Diagnosis not present

## 2024-01-31 DIAGNOSIS — G4733 Obstructive sleep apnea (adult) (pediatric): Secondary | ICD-10-CM | POA: Diagnosis not present

## 2024-01-31 DIAGNOSIS — I130 Hypertensive heart and chronic kidney disease with heart failure and stage 1 through stage 4 chronic kidney disease, or unspecified chronic kidney disease: Secondary | ICD-10-CM | POA: Diagnosis not present

## 2024-01-31 DIAGNOSIS — I5032 Chronic diastolic (congestive) heart failure: Secondary | ICD-10-CM | POA: Insufficient documentation

## 2024-01-31 DIAGNOSIS — R32 Unspecified urinary incontinence: Secondary | ICD-10-CM | POA: Insufficient documentation

## 2024-01-31 DIAGNOSIS — M7989 Other specified soft tissue disorders: Secondary | ICD-10-CM | POA: Diagnosis not present

## 2024-01-31 DIAGNOSIS — M14672 Charcot's joint, left ankle and foot: Secondary | ICD-10-CM

## 2024-01-31 DIAGNOSIS — I2510 Atherosclerotic heart disease of native coronary artery without angina pectoris: Secondary | ICD-10-CM | POA: Insufficient documentation

## 2024-01-31 DIAGNOSIS — E1165 Type 2 diabetes mellitus with hyperglycemia: Secondary | ICD-10-CM | POA: Insufficient documentation

## 2024-01-31 DIAGNOSIS — E1142 Type 2 diabetes mellitus with diabetic polyneuropathy: Secondary | ICD-10-CM

## 2024-01-31 DIAGNOSIS — E11610 Type 2 diabetes mellitus with diabetic neuropathic arthropathy: Secondary | ICD-10-CM | POA: Insufficient documentation

## 2024-01-31 DIAGNOSIS — R2231 Localized swelling, mass and lump, right upper limb: Secondary | ICD-10-CM | POA: Diagnosis present

## 2024-01-31 LAB — COMPREHENSIVE METABOLIC PANEL WITH GFR
ALT: 21 U/L (ref 0–44)
AST: 30 U/L (ref 15–41)
Albumin: 3.1 g/dL — ABNORMAL LOW (ref 3.5–5.0)
Alkaline Phosphatase: 167 U/L — ABNORMAL HIGH (ref 38–126)
Anion gap: 13 (ref 5–15)
BUN: 23 mg/dL (ref 8–23)
CO2: 23 mmol/L (ref 22–32)
Calcium: 9.3 mg/dL (ref 8.9–10.3)
Chloride: 102 mmol/L (ref 98–111)
Creatinine, Ser: 1.3 mg/dL — ABNORMAL HIGH (ref 0.44–1.00)
GFR, Estimated: 42 mL/min — ABNORMAL LOW (ref >60)
Glucose, Bld: 88 mg/dL (ref 70–99)
Potassium: 4.4 mmol/L (ref 3.5–5.1)
Sodium: 138 mmol/L (ref 135–145)
Total Bilirubin: 0.7 mg/dL (ref 0.0–1.2)
Total Protein: 8.2 g/dL — ABNORMAL HIGH (ref 6.5–8.1)

## 2024-01-31 LAB — CBC WITH DIFFERENTIAL/PLATELET
Abs Immature Granulocytes: 0.32 K/uL — ABNORMAL HIGH (ref 0.00–0.07)
Basophils Absolute: 0.1 K/uL (ref 0.0–0.1)
Basophils Relative: 1 %
Eosinophils Absolute: 0.2 K/uL (ref 0.0–0.5)
Eosinophils Relative: 1 %
HCT: 40.1 % (ref 36.0–46.0)
Hemoglobin: 12.7 g/dL (ref 12.0–15.0)
Immature Granulocytes: 2 %
Lymphocytes Relative: 30 %
Lymphs Abs: 5 K/uL — ABNORMAL HIGH (ref 0.7–4.0)
MCH: 28.6 pg (ref 26.0–34.0)
MCHC: 31.7 g/dL (ref 30.0–36.0)
MCV: 90.3 fL (ref 80.0–100.0)
Monocytes Absolute: 0.9 K/uL (ref 0.1–1.0)
Monocytes Relative: 6 %
Neutro Abs: 9.9 K/uL — ABNORMAL HIGH (ref 1.7–7.7)
Neutrophils Relative %: 60 %
Platelets: 535 K/uL — ABNORMAL HIGH (ref 150–400)
RBC: 4.44 MIL/uL (ref 3.87–5.11)
RDW: 15.9 % — ABNORMAL HIGH (ref 11.5–15.5)
WBC: 16.5 K/uL — ABNORMAL HIGH (ref 4.0–10.5)
nRBC: 0 % (ref 0.0–0.2)

## 2024-01-31 LAB — CBG MONITORING, ED: Glucose-Capillary: 149 mg/dL — ABNORMAL HIGH (ref 70–99)

## 2024-01-31 LAB — LACTIC ACID, PLASMA: Lactic Acid, Venous: 1.6 mmol/L (ref 0.5–1.9)

## 2024-01-31 MED ORDER — SODIUM CHLORIDE 0.9 % IV SOLN: INTRAVENOUS | Status: DC

## 2024-01-31 MED ORDER — METHYLPREDNISOLONE SODIUM SUCC 40 MG IJ SOLR
40.0000 mg | Freq: Two times a day (BID) | INTRAMUSCULAR | Status: DC
  Administered 2024-01-31 – 2024-02-01 (×2): 40 mg via INTRAVENOUS
  Filled 2024-01-31 (×2): qty 1

## 2024-01-31 MED ORDER — ACETAMINOPHEN 325 MG PO TABS
650.0000 mg | ORAL_TABLET | Freq: Four times a day (QID) | ORAL | Status: DC | PRN
  Administered 2024-02-01: 650 mg via ORAL
  Filled 2024-01-31: qty 2

## 2024-01-31 MED ORDER — ATORVASTATIN CALCIUM 20 MG PO TABS
80.0000 mg | ORAL_TABLET | Freq: Every day | ORAL | Status: DC
  Administered 2024-02-01 – 2024-02-03 (×3): 80 mg via ORAL
  Filled 2024-01-31 (×3): qty 4

## 2024-01-31 MED ORDER — INSULIN ASPART 100 UNIT/ML IJ SOLN
0.0000 [IU] | Freq: Three times a day (TID) | INTRAMUSCULAR | Status: DC
  Administered 2024-02-01: 7 [IU] via SUBCUTANEOUS
  Administered 2024-02-01: 9 [IU] via SUBCUTANEOUS
  Filled 2024-01-31 (×2): qty 1

## 2024-01-31 MED ORDER — HYDROMORPHONE HCL 2 MG PO TABS
1.0000 mg | ORAL_TABLET | Freq: Four times a day (QID) | ORAL | Status: DC | PRN
  Administered 2024-01-31: 1 mg via ORAL
  Filled 2024-01-31: qty 1

## 2024-01-31 MED ORDER — ALLOPURINOL 100 MG PO TABS
100.0000 mg | ORAL_TABLET | Freq: Every day | ORAL | Status: DC
  Administered 2024-02-01 – 2024-02-03 (×3): 100 mg via ORAL
  Filled 2024-01-31 (×3): qty 1

## 2024-01-31 MED ORDER — CEFAZOLIN SODIUM-DEXTROSE 2-4 GM/100ML-% IV SOLN
2.0000 g | Freq: Once | INTRAVENOUS | Status: AC
  Administered 2024-01-31: 2 g via INTRAVENOUS
  Filled 2024-01-31: qty 100

## 2024-01-31 MED ORDER — INSULIN ASPART 100 UNIT/ML IJ SOLN
0.0000 [IU] | Freq: Every day | INTRAMUSCULAR | Status: DC
  Administered 2024-02-01: 5 [IU] via SUBCUTANEOUS
  Filled 2024-01-31 (×2): qty 1

## 2024-01-31 MED ORDER — HYDROMORPHONE HCL 1 MG/ML PO LIQD
0.5000 mg | Freq: Four times a day (QID) | ORAL | Status: DC | PRN
  Filled 2024-01-31: qty 1

## 2024-01-31 MED ORDER — PANTOPRAZOLE SODIUM 40 MG IV SOLR
40.0000 mg | Freq: Two times a day (BID) | INTRAVENOUS | Status: DC
  Administered 2024-01-31: 40 mg via INTRAVENOUS
  Filled 2024-01-31: qty 10

## 2024-01-31 MED ORDER — ONDANSETRON HCL 4 MG PO TABS: 4.0000 mg | ORAL_TABLET | Freq: Four times a day (QID) | ORAL | Status: DC | PRN

## 2024-01-31 MED ORDER — TRAMADOL HCL 50 MG PO TABS
50.0000 mg | ORAL_TABLET | Freq: Once | ORAL | Status: AC
  Administered 2024-01-31: 50 mg via ORAL
  Filled 2024-01-31: qty 1

## 2024-01-31 MED ORDER — VANCOMYCIN HCL IN DEXTROSE 1-5 GM/200ML-% IV SOLN
1000.0000 mg | Freq: Once | INTRAVENOUS | Status: AC
  Administered 2024-01-31: 1000 mg via INTRAVENOUS
  Filled 2024-01-31: qty 200

## 2024-01-31 MED ORDER — ACETAMINOPHEN 650 MG RE SUPP: 650.0000 mg | Freq: Four times a day (QID) | RECTAL | Status: DC | PRN

## 2024-01-31 MED ORDER — ENOXAPARIN SODIUM 40 MG/0.4ML IJ SOSY: 40.0000 mg | PREFILLED_SYRINGE | INTRAMUSCULAR | Status: DC

## 2024-01-31 MED ORDER — ONDANSETRON HCL 4 MG/2ML IJ SOLN: 4.0000 mg | Freq: Four times a day (QID) | INTRAMUSCULAR | Status: DC | PRN

## 2024-01-31 MED ORDER — COLCHICINE 0.6 MG PO TABS
0.6000 mg | ORAL_TABLET | Freq: Two times a day (BID) | ORAL | Status: DC
  Administered 2024-01-31 – 2024-02-03 (×6): 0.6 mg via ORAL
  Filled 2024-01-31 (×7): qty 1

## 2024-01-31 MED ORDER — APIXABAN 5 MG PO TABS
5.0000 mg | ORAL_TABLET | Freq: Two times a day (BID) | ORAL | Status: DC
  Administered 2024-01-31 – 2024-02-03 (×6): 5 mg via ORAL
  Filled 2024-01-31 (×6): qty 1

## 2024-01-31 NOTE — H&P (Signed)
 History and Physical    Denise Sharp:[id number] DOB: 1945-08-31 DOA: 01/31/2024  DOS: the patient was seen and examined on 01/31/2024  PCP: Marylynn Verneita CROME, MD   Patient coming from: Home  I have personally briefly reviewed patient's old medical records in [facility]  Chief Complaint: Right hand swelling and left ankle pain for two weeks  HPI: Denise Sharp is a pleasant 78 y.o. female with medical history significant for HFpEF, CAD, atrial fibrillation, type II DM, CKD, morbid obesity who presented with right hand pain and swelling of the left ankle for the last 2 weeks, worsening over the last several days and associated with redness.  She stated that pain is 7/10, continuous, worsening with the movement and better with the pain medications.  The swelling is the worst over the distal part of her right hand knuckle mostly 2nd and 3rd digits.  She initially thought it might be gout.  She has seen Ortho today and told her to come to the emergency room with the concern for right hand cellulitis. She also stated that she has been complaining of left ankle pain which is also persistent for the last couple weeks.  She stated that left ankle pain is a still severe and 7/10 in intensity. She denies any trauma to the hand or ankle.  Has she reported that an x-ray was done today of her hand and there was no bony involvement or fracture.  ED Course: Upon arrival to the ED, patient is found to have a right hand cellulitis and left ankle gout.  She was found to have white count of 16.5 creatinine 1.30.  Lactate was within normal limit.  Review of Systems:  ROS  All other systems negative except as noted in the HPI.  Past Medical History:  Diagnosis Date   (HFpEF) heart failure with preserved ejection fraction (HCC)    a. 12/2019 Echo: EF 65-70%, Gr2 DD. No significant valvular dzs.   Abnormality of gait 03/25/2013   Adrenal mass, left (HCC)    Anginal pain (HCC)    Anxiety     Aortic atherosclerosis (HCC)    Arthritis    Asthma    CAD (coronary artery disease) with h/o Atypical Chest Pain    a. 04/1986 Cath ([facility]): nl cors, EF 65%; b. 07/2012 Cath [facility]): Diff minor irregs; c. 07/2016 MV [place]): Equivocal; d. 08/2016 Cardiac CT Ca2+ score [facility]): Ca2+ 1548; e. 05/2019 MV: No ischemia. EF 75%; f. 12/2019 PCI: LM nl, LAD 65ost (3.5x12 Resolute DES), 56m (2.75x12 Resolute DES),LCX/RCA nl; g. 08/2020 Cath: patent LAD stents, RCA 40ost, elev RH pressures, EF >65%.   Cervical spinal stenosis 1994   due to trauma to back ([employer] accident), has intermittent paralysis and parasthesias   Cervicogenic headache 03/23/2014   CKD (chronic kidney disease), stage III (HCC)    Depression    Diverticulosis    Dizziness    a.) chronic   DJD (degenerative joint disease)    a. Chronic R shoulder pain   Dyspnea    Esophageal stenosis 09/2009   a.) transient outlet obstruction by food, cleared by EGD   Family history of adverse reaction to anesthesia    a.) daughter with (+) PONV   Gastric bypass status for obesity    GERD (gastroesophageal reflux disease)    Gout    Headache(784.0)    HLD (hyperlipidemia)    Hypertension    IBS (irritable bowel syndrome)    IDA (iron  deficiency anemia)  a.) post 2 unit txfsn 2009, normal endo/colonoscopy by [facility]   Left bundle branch block (LBBB)    a.) Intermittently present - likely rate related.   NSVT (nonsustained ventricular tachycardia) (HCC) 11/15/2020   a.) Holter study 11/15/2020; 11 episodes of NSVT lasting up to 8 beats with a maximum rate of 210 bpm; 14 atrial runs lasting up to 18 beats with a rate of up to 250 bpm.  Some atrial runs felt to be NSVT.   NSVT (nonsustained ventricular tachycardia) (HCC)    a. 10/2020 [facility]: 11 runs of NSVT up to 8 beats.   Obesity    OSA on CPAP    Polyneuropathy in diabetes(357.2) 03/25/2013   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/09/2021   Right V1 distribution   PSVT (paroxysmal supraventricular  tachycardia) (HCC)    a. 10/2020 [facility]: 14 atrial runs up to 18 beats, max HR 250.   Restless leg syndrome    Rotator cuff arthropathy, right 08/13/2017   Syncope and collapse 03/12/2014   Type II diabetes mellitus (HCC)     Past Surgical History:  Procedure Laterality Date   APPENDECTOMY     BACK SURGERY     CARDIOVASCULAR STRESS TEST     CARDIOVERSION N/A 10/30/2022   Procedure: CARDIOVERSION;  Surgeon: Mady Bruckner, MD;  Location: [facility] ORS;  Service: Cardiovascular;  Laterality: N/A;   CHOLECYSTECTOMY N/A 1976   COLONOSCOPY     CORONARY ANGIOPLASTY     CORONARY STENT INTERVENTION N/A 01/04/2020   Procedure: CORONARY STENT INTERVENTION;  Surgeon: Darron Deatrice LABOR, MD;  Location: [facility] INVASIVE CV LAB;  Service: Cardiovascular;  Laterality: N/A;  LAD    DIAPHRAGMATIC HERNIA REPAIR  2015   ESOPHAGEAL DILATION     multiple   ESOPHAGOGASTRODUODENOSCOPY (EGD) WITH PROPOFOL  N/A 05/11/2021   Procedure: ESOPHAGOGASTRODUODENOSCOPY (EGD) WITH PROPOFOL ;  Surgeon: Jinny Carmine, MD;  Location: [facility] ENDOSCOPY;  Service: Endoscopy;  Laterality: N/A;   ESOPHAGOGASTRODUODENOSCOPY (EGD) WITH PROPOFOL  N/A 06/13/2021   Procedure: ESOPHAGOGASTRODUODENOSCOPY (EGD) WITH PROPOFOL ;  Surgeon: Jinny Carmine, MD;  Location: [facility] END[facility];  Service: Endoscopy;  Laterality: N/A;   ESOPHAGOGASTRODUODENOSCOPY (EGD) WITH PROPOFOL  N/A 02/28/2023   Procedure: ESOPHAGOGASTRODUODENOSCOPY (EGD) WITH PROPOFOL ;  Surgeon: Jinny Carmine, MD;  Location: [facility] END[facility];  Service: Endoscopy;  Laterality: N/A;   EYE SURGERY     GASTRIC BYPASS  2000, 2005   Dr. Mia REDBIRD IMPLANT PLACEMENT  10/2011   Ike REDBIRD IMPLANT PLACEMENT N/A 09/18/2021   Procedure: REDBIRD IMPLANT FIRST STAGE;  Surgeon: Gaston Hamilton, MD;  Location: [facility] OR[facility];  Service: Urology;  Laterality: N/A;   INTERSTIM IMPLANT PLACEMENT N/A 09/18/2021   Procedure: REDBIRD IMPLANT SECOND STAGE WITH IMPEDENCE CHECK;  Surgeon: Gaston Hamilton, MD;  Location: [facility];  Service: Urology;  Laterality: N/A;   INTERSTIM IMPLANT REMOVAL N/A 09/18/2021   Procedure: REMOVAL OF INTERSTIM IMPLANT;  Surgeon: Gaston Hamilton, MD;  Location: [facility];  Service: Urology;  Laterality: N/A;   LEFT HEART CATH AND CORONARY ANGIOGRAPHY N/A 04/1986   LEFT HEART CATH AND CORONARY ANGIOGRAPHY Left 07/24/2012   LEFT HEART CATH AND CORS/GRAFTS ANGIOGRAPHY N/A 12/31/2019   Procedure: LEFT HEART CATH AND CORS/GRAFTS ANGIOGRAPHY;  Surgeon: Perla Evalene PARAS, MD;  Location: [facility];  Service: Cardiovascular;  Laterality: N/A;   PANNICULECTOMY N/A 06/16/2019   Procedure: PANNICULECTOMY;  Surgeon: Elisabeth Craig RAMAN, MD;  Location: [facility];  Service: Plastics;  Laterality: N/A;  3 hours, please   REVERSE SHOULDER ARTHROPLASTY Right 08/13/2017  Procedure: REVERSE RIGHT SHOULDER ARTHROPLASTY;  Surgeon: Josefina Chew, MD;  Location: [facility] OR;  Service: Orthopedics;  Laterality: Right;   RIGHT/LEFT HEART CATH AND CORONARY ANGIOGRAPHY N/A 09/06/2020   Procedure: RIGHT/LEFT HEART CATH AND CORONARY ANGIOGRAPHY;  Surgeon: Mady Bruckner, MD;  Location: [facility];  Service: Cardiovascular;  Laterality: N/A;   RIGHT/LEFT HEART CATH AND CORONARY ANGIOGRAPHY N/A 06/04/2022   Procedure: RIGHT/LEFT HEART CATH AND CORONARY ANGIOGRAPHY;  Surgeon: Mady Bruckner, MD;  Location: [facility];  Service: Cardiovascular;  Laterality: N/A;   ROTATOR CUFF REPAIR Right    SPINE SURGERY  1995   [place]   TOOTH EXTRACTION  11/13/2021   TOTAL ABDOMINAL HYSTERECTOMY W/ BILATERAL SALPINGOOPHORECTOMY  1974   TOTAL KNEE ARTHROPLASTY Bilateral 2007   Procedure: TOTAL KNEE ARTHROPLASTY; Surgeons: Kathi, MD and [name], MD   UMBILICAL HERNIA REPAIR  08/11/2015     reports that she has never smoked. She has never been exposed to tobacco smoke. She has never used smokeless tobacco. She reports that she does not currently use drugs. She reports that she does  not drink alcohol .  Allergies  Allergen Reactions   Amoxicillin  Diarrhea    amoxicillin    Clarithromycin  Nausea And Vomiting and Other (See Comments)    Severe irritable bowel  clarithromycin    Demeclocycline Hives   Erythromycin Nausea And Vomiting and Other (See Comments)    Severe irritable bowel   Flagyl [Metronidazole] Nausea And Vomiting and Other (See Comments)    Severe irritable bowel   Glucophage [Metformin Hcl] Nausea And Vomiting and Other (See Comments)    Sick I won't take anything that has metformin in it   Tetracyclines & Related Hives and Rash   Potassium Chloride  Other (See Comments)    Cannot swallow potssium pills, able to tolerate liquid potassium   Tetracycline Other (See Comments)   Diovan [Valsartan] Nausea Only        Sulfa  Antibiotics Other (See Comments) and Rash    As child   Xanax [Alprazolam] Other (See Comments)    Hyperactivity     Family History  Problem Relation Age of Onset   Heart disease Father    Hypertension Father    Prostate cancer Father    Stroke Father    Osteoporosis Father    Stroke Mother    Depression Mother    Headache Mother    Heart disease Mother    Thyroid  disease Mother    Hypertension Mother    Diabetes Daughter    Heart disease Daughter    Hypertension Daughter    Hypertension Son     Prior to Admission medications   Medication Sig Start Date End Date Taking? Authorizing Provider  allopurinol  (ZYLOPRIM ) 100 MG tablet Take 1 tablet (100 mg total) by mouth daily. 01/24/24   Rice, Bruckner ORN, MD  apixaban  (ELIQUIS ) 5 MG TABS tablet Take 1 tablet (5 mg total) by mouth 2 (two) times daily. 06/19/23   End, Bruckner, MD  atorvastatin  (LIPITOR ) 80 MG tablet Take 1 tablet (80 mg total) by mouth daily. 01/09/24   Marylynn Verneita CROME, MD  budesonide -formoterol  (SYMBICORT ) 160-4.5 MCG/ACT inhaler Inhale 2 puffs into the lungs 2 (two) times daily. 03/07/23   Sood, Vineet, MD  butalbital -acetaminophen -caffeine   (FIORICET ) 50-325-40 MG tablet TAKE 1 TABLET BY MOUTH EVERY 6 HOURS AS NEEDED FOR HEADACHE. 07/23/22   Marylynn Verneita CROME, MD  Cholecalciferol  (VITAMIN D3) 250 MCG (10000 UT) capsule Take 10,000 Units by mouth daily.    [provider]  clotrimazole -betamethasone  (LOTRISONE ) cream Apply 1 application topically 2 (two) times daily as needed (irritation).    [provider]  colchicine  0.6 MG tablet Take 1 tablet (0.6 mg total) by mouth 2 (two) times daily. Patient not taking: Reported on 01/27/2024 12/19/23   Bair, Kalpana, MD  conjugated estrogens  (PREMARIN ) vaginal cream Apply one pea-sized amount around the opening of the urethra daily for 2 weeks, then 3 times weekly moving forward. 04/20/22   Vaillancourt, Samantha, PA-C  Continuous Glucose Sensor (FREESTYLE LIBRE 3 PLUS SENSOR) MISC Change sensor every 15 days. 01/07/24   Therisa Benton PARAS, NP  cyanocobalamin  (VITAMIN B12) 1000 MCG/ML injection Inject 1 mL (1,000 mcg total) into the muscle every 30 (thirty) days. Inject 1 ml (1000 mcg ) IM weekly x 4,  Then monthly thereafter 10/09/23   Marylynn Verneita CROME, MD  dapagliflozin  propanediol (FARXIGA ) 10 MG TABS tablet Take 1 tablet (10 mg total) by mouth daily. 01/07/24   Therisa Benton PARAS, NP  dicyclomine  (BENTYL ) 20 MG tablet Take 1 tablet (20 mg total) by mouth 3 (three) times daily before meals. 01/09/24   Marylynn Verneita CROME, MD  DULoxetine  (CYMBALTA ) 60 MG capsule Take 1 capsule (60 mg total) by mouth daily. 05/01/23   Gayland Lauraine PARAS, NP  ezetimibe  (ZETIA ) 10 MG tablet Take 1 tablet (10 mg total) by mouth daily. 01/09/24 04/08/24  Marylynn Verneita CROME, MD  fexofenadine (ALLEGRA) 180 MG tablet Take 180 mg by mouth as needed for allergies or rhinitis.    [provider]  Finerenone  (KERENDIA ) 10 MG TABS Take 1 tablet (10 mg total) by mouth in the morning. 10/21/23   Furth, Cadence H, PA-C  fluticasone  (FLONASE ) 50 MCG/ACT nasal spray Place 2 sprays into both nostrils daily. 04/11/22   Maribeth Camellia MATSU, MD  Glucagon  (GVOKE HYPOPEN  2-PACK) 0.5 MG/0.1ML SOAJ Inject 1 auto injector subcutaneously as needed for severe hypoglycemia. May repeat x 1 after 15 minutes if needed. 09/25/22   Gretel App, NP  glucose blood (ONETOUCH VERIO) test strip Use to check blood sugar twice daily. 02/23/22   Marylynn Verneita CROME, MD  hydrocortisone  (ANUSOL -HC) 25 MG suppository Place 1 suppository (25 mg total) rectally 2 (two) times daily. 04/01/23   Wouk, Devaughn Sayres, MD  insulin  aspart (NOVOLOG  FLEXPEN) 100 UNIT/ML FlexPen Inject 8-14 Units into the skin 3 (three) times daily with meals. 01/07/24   Therisa Benton PARAS, NP  insulin  degludec (TRESIBA  FLEXTOUCH) 100 UNIT/ML FlexTouch Pen Inject 15 Units into the skin daily. 01/07/24   Therisa Benton PARAS, NP  Insulin  Pen Needle (DROPLET PEN NEEDLES) 31G X 5 MM MISC USE ONE NEEDLE SUBCUTANEOUSLY AS DIRECTED (REMOVE AND DISCARD NEEDLE IN SHARPS CONTAINER IMMEDIATELY AFTER USE) 09/30/23   Therisa Benton PARAS, NP  lidocaine  (LIDODERM ) 5 % PLACE 1 PATCH ONTO THE SKIN DAILY. REMOVE & DISCARD PATCH WITHIN 12 HOURS OR AS DIRECTED BY MD 01/16/24   Marylynn Verneita CROME, MD  lipase/protease/amylase (CREON ) 36000 UNITS CPEP capsule Take 2 capsules (72,000 Units total) by mouth 3 (three) times daily before meals AND 1 capsule (36,000 Units total) with snacks. 03/08/23 03/02/24  [place], PA-C  loperamide  (IMODIUM ) 2 MG capsule Take 4-6 mg by mouth 3 (three) times daily before meals.    [provider]  mometasone  (ELOCON ) 0.1 % cream Apply topically. 12/13/15   [provider]  mupirocin  cream (BACTROBAN ) 2 % Apply topically. 02/02/16   [provider]  nitroGLYCERIN  (NITROSTAT ) 0.4 MG SL tablet Place 1  tablet (0.4 mg total) under the tongue every 5 (five) minutes as needed for chest pain. 04/14/23   Marylynn Verneita CROME, MD  nystatin  (MYCOSTATIN /NYSTOP ) powder Apply 1 g topically 4 (four) times daily as needed (rash).    [provider]  ondansetron  (ZOFRAN -ODT) 4  MG disintegrating tablet Take 1 tablet (4 mg total) by mouth every 8 (eight) hours as needed for nausea or vomiting. 07/13/22   Marylynn Verneita CROME, MD  OneTouch Delica Lancets 33G MISC Used to check blood sugar two times a day. 02/22/22   Marylynn Verneita CROME, MD  OVER THE COUNTER MEDICATION Apply 1 application  topically daily as needed (pain). [place] Gel topical pain reliever    [provider]  pantoprazole  (PROTONIX ) 40 MG tablet Take 1 tablet (40 mg total) by mouth daily. 03/04/23 02/27/24  [place], PA-C  Polyethyl Glycol-Propyl Glycol (SYSTANE OP) Place 1 drop into both eyes 4 (four) times daily as needed (dry eyes).    [provider]  potassium chloride  20 MEQ/15ML (10%) SOLN Take 15 mLs (20 mEq total) by mouth daily. 05/15/23   End, Lonni, MD  pregabalin  (LYRICA ) 50 MG capsule Take 1 capsule (50 mg total) by mouth 2 (two) times daily. 08/01/23   Marylynn Verneita CROME, MD  tiZANidine  (ZANAFLEX ) 4 MG tablet Take 1 tablet (4 mg total) by mouth in the morning and at bedtime. 12/19/23   Bair, Kalpana, MD  tiZANidine  (ZANAFLEX ) 4 MG tablet Take 4 mg by mouth. 12/19/23   [provider]  torsemide  (DEMADEX ) 20 MG tablet Take 1 tablet (20 mg total) by mouth daily. 01/27/24 04/26/24  Gardenia Led, DO    Physical Exam: Vitals:   01/31/24 1840 01/31/24 1900 01/31/24 1906 01/31/24 1930  BP: 130/60 (!) 118/55  134/69  Pulse: 77 74  71  Resp: 18     Temp:   98.5 F (36.9 C)   TempSrc:   Oral   SpO2: 99% 100%  100%    Physical Exam   Constitutional: Alert, awake, calm, comfortable HEENT: Neck supple Respiratory: Clear to auscultation B/L, no wheezing, no rales.  Cardiovascular: Regular rate and rhythm, no murmurs / rubs / gallops. No extremity edema. 2+ pedal pulses. No carotid bruits.  Abdomen: Soft, no tenderness, Bowel sounds positive.  Musculoskeletal: Right hand cellulitis present, left ankle swelling present Skin: no rashes, lesions, ulcers. Neurologic: CN 2-12  grossly intact. Sensation intact, No focal deficit identified Psychiatric: Alert and oriented x 3. Normal mood.    Labs on Admission: I have personally reviewed following labs and imaging studies  CBC: Recent Labs  Lab 01/31/24 1544  WBC 16.5*  NEUTROABS 9.9*  HGB 12.7  HCT 40.1  MCV 90.3  PLT 535*   Basic Metabolic Panel: Recent Labs  Lab 01/31/24 1544  NA 138  K 4.4  CL 102  CO2 23  GLUCOSE 88  BUN 23  CREATININE 1.30*  CALCIUM  9.3   GFR: CrCl cannot be calculated (Unknown ideal weight.). Liver Function Tests: Recent Labs  Lab 01/31/24 1544  AST 30  ALT 21  ALKPHOS 167*  BILITOT 0.7  PROT 8.2*  ALBUMIN  3.1*   No results for input(s): LIPASE, AMYLASE in the last 168 hours. No results for input(s): AMMONIA in the last 168 hours. Coagulation Profile: No results for input(s): INR, PROTIME in the last 168 hours. Cardiac Enzymes: No results for input(s): CKTOTAL, CKMB, CKMBINDEX, TROPONINI, TROPONINIHS in the last 168 hours. BNP (last 3 results) Recent Labs  05/23/23 1329 07/01/23 1315 11/06/23 1710  BNP 89.8 40.6 62.6   Urine analysis:    Component Value Date/Time   COLORURINE YELLOW 02/17/2021 1008   APPEARANCEUR Clear 12/23/2023 1309   LABSPEC >=1.030 (A) 02/17/2021 1008   LABSPEC 1.006 06/21/2014 1623   PHURINE 5.0 02/17/2021 1008   GLUCOSEU 3+ (A) 12/23/2023 1309   GLUCOSEU 500 (A) 02/17/2021 1008   HGBUR NEGATIVE 02/17/2021 1008   BILIRUBINUR Negative 12/23/2023 1309   BILIRUBINUR Negative 06/21/2014 1623   KETONESUR NEGATIVE 02/17/2021 1008   PROTEINUR Negative 12/23/2023 1309   PROTEINUR NEGATIVE 02/08/2021 1323   UROBILINOGEN 0.2 02/17/2021 1008   NITRITE Negative 12/23/2023 1309   NITRITE NEGATIVE 02/17/2021 1008   LEUKOCYTESUR Negative 12/23/2023 1309   LEUKOCYTESUR SMALL (A) 02/17/2021 1008   LEUKOCYTESUR Negative 06/21/2014 1623    Radiological Exams on Admission: I have personally reviewed images No  results found.  EKG: My personal interpretation of EKG shows: Normal sinus rhythm    Assessment/Plan Principal Problem:   Cellulitis Active Problems:   Type II diabetes mellitus with neurological manifestations (HCC)   OSA on CPAP   Gout of left ankle    Assessment and Plan: 78 year old morbidly obese female with multiple medical problems including but not limited to diabetes, gout, OSA on CPAP sent over by orthopedic office for right hand cellulitis.  Patient also has a gout of left ankle that is ongoing.  Right hand cellulitis - ED patient received cefazolin  - Will get blood cultures - I will start on ceftriaxone  and vancomycin  - Follow the cultures - Start on pain medications Tylenol  and Dilaudid   2.  Left ankle gout - Patient was taking colchicine  in the past which will be continued - Continue allopurinol  that is her home medication being taken for the last 1 week - I will start her on steroid for couple days Solu-Medrol  - Pain medications Dilaudid  as mentioned above - Will put her on Protonix  while she is on steroid.  3.  Diabetes type 2 - Will put on insulin  sliding scale - I will also put her on Lantus  10 unit - Continue to monitor blood sugars  4.  OSA - Continue CPAP    DVT prophylaxis: Eliquis  Code Status: Full Code Family Communication: Patient's husband was advised Disposition Plan: Home Consults called: None Admission status: Inpatient, Med-Surg   Nena Rebel, MD [facility] 01/31/2024, 7:50 PM

## 2024-01-31 NOTE — ED Triage Notes (Signed)
 Patient states right hand pain and swelling for 1.5 weeks; had negative xray today at [facility]. Sent over for possible cellulitis.

## 2024-01-31 NOTE — ED Notes (Signed)
 Tech called for lab to collect blood sample. Called Virl to make her aware.

## 2024-01-31 NOTE — ED Provider Notes (Signed)
 [facility] Provider Note    Event Date/Time   First MD Initiated Contact with Patient 01/31/24 1615     (approximate)   History   Hand Pain   HPI  Denise Sharp is a 78 y.o. female with history of HFpEF, CAD, atrial fibrillation, type 2 diabetes, CKD, and morbid obesity who presents with right hand pain and swelling over the last 2 weeks, worsening over the last several days and associated with redness.  The swelling is the worst over the distal part of her hand and her 2nd and 3rd digits.  She initially thought it might be gout.  She was seen at [facility] today and told to come into the ED with concern for hand cellulitis.  She has some pain going up to about the mid forearm.  She has had some chills but is not sure if she had a fever.  She denies any trauma to the hand.  She reports that an x-ray was done today at [facility] and was negative.  I reviewed the past medical records.  The patient's most recent outpatient encounter was on 7/14 with the heart failure clinic for follow-up of her CHF.   Physical Exam   Triage Vital Signs: ED Triage Vitals [01/31/24 1514]  Encounter Vitals Group     BP 118/71     Girls Systolic BP Percentile      Girls Diastolic BP Percentile      Boys Systolic BP Percentile      Boys Diastolic BP Percentile      Pulse Rate 77     Resp 20     Temp 98.9 F (37.2 C)     Temp Source Oral     SpO2 100 %     Weight      Height      Head Circumference      Peak Flow      Pain Score      Pain Loc      Pain Education      Exclude from Growth Chart     Most recent vital signs: Vitals:   01/31/24 1840 01/31/24 1906  BP: 130/60   Pulse: 77   Resp: 18   Temp:  98.5 F (36.9 C)  SpO2: 99%      General: Awake, no distress.  CV:  Good peripheral perfusion.  Resp:  Normal effort.  Abd:  No distention.  Other:  Right hand with swelling especially over the MCP joints and 2nd and 3rd digits.  There is erythema,  increased warmth, and slight induration to these areas.  The patient has decreased range of motion in flexion and extension.  The fingers are held in slight flexion.  The patient has mild pain on passive flexion and extension.  There are no sausage digits.  There are no open wounds or lesions.  There is no streaking going up the arm.  Cap refill is normal.  Radial pulse is 2+.   ED Results / Procedures / Treatments   Labs (all labs ordered are listed, but only abnormal results are displayed) Labs Reviewed  COMPREHENSIVE METABOLIC PANEL WITH GFR - Abnormal; Notable for the following components:      Result Value   Creatinine, Ser 1.30 (*)    Total Protein 8.2 (*)    Albumin  3.1 (*)    Alkaline Phosphatase 167 (*)    GFR, Estimated 42 (*)    All other components within normal limits  CBC  WITH DIFFERENTIAL/PLATELET - Abnormal; Notable for the following components:   WBC 16.5 (*)    RDW 15.9 (*)    Platelets 535 (*)    Neutro Abs 9.9 (*)    Lymphs Abs 5.0 (*)    Abs Immature Granulocytes 0.32 (*)    All other components within normal limits  LACTIC ACID, PLASMA  CBC  CREATININE, SERUM  COMPREHENSIVE METABOLIC PANEL WITH GFR  CBC  PROTIME-INR     EKG     RADIOLOGY    PROCEDURES:  Critical Care performed: No  Procedures   MEDICATIONS ORDERED IN ED: Medications  vancomycin  (VANCOCIN ) IVPB 1000 mg/200 mL premix (1,000 mg Intravenous New Bag/Given 01/31/24 1909)  enoxaparin  (LOVENOX ) injection 40 mg (has no administration in time range)  0.9 %  sodium chloride  infusion (has no administration in time range)  acetaminophen  (TYLENOL ) tablet 650 mg (has no administration in time range)    Or  acetaminophen  (TYLENOL ) suppository 650 mg (has no administration in time range)  ondansetron  (ZOFRAN ) tablet 4 mg (has no administration in time range)    Or  ondansetron  (ZOFRAN ) injection 4 mg (has no administration in time range)  ceFAZolin  (ANCEF ) IVPB 2g/100 mL premix (0 g  Intravenous Stopped 01/31/24 1905)  traMADol  (ULTRAM ) tablet 50 mg (50 mg Oral Given 01/31/24 1707)     IMPRESSION / MDM / ASSESSMENT AND PLAN / ED COURSE  I reviewed the triage vital signs and the nursing notes.  78 year old female with PMH as noted above presents with right hand pain and swelling over the last 2 weeks, worse in the last several days.  She was sent in from urgent Ortho for possible hand cellulitis.  On exam her vital signs are normal.  Physical exam reveals the above findings of swelling, erythema, induration to the right hand.   Initial lab workup reveals leukocytosis.  Lactate is normal.  CMP shows no acute findings.  Differential diagnosis includes, but is not limited to, hand cellulitis, other soft tissue infection.  There is no evidence of abscess.  The exam is not consistent with flexor tenosynovitis.  We will give IV antibiotics I will discuss with orthopedics.  Patient's presentation is most consistent with acute presentation with potential threat to life or bodily function.  ----------------------------------------- 7:12 PM on 01/31/2024 -----------------------------------------  I consulted and discussed the case with Dr. Cleotilde from orthopedics who agrees with the current management.  I then consulted Dr. Roann from the hospitalist service; based on our discussion he agrees to evaluate the patient for admission.   FINAL CLINICAL IMPRESSION(S) / ED DIAGNOSES   Final diagnoses:  Cellulitis of right hand     Rx / DC Orders   ED Discharge Orders     None        Note:  This document was prepared using Dragon voice recognition software and may include unintentional dictation errors.    Jacolyn Pae, MD 01/31/24 [phone]

## 2024-01-31 NOTE — ED Notes (Signed)
 Called lab to stick pt at this time. Lab sending tech to come.

## 2024-02-01 DIAGNOSIS — G4733 Obstructive sleep apnea (adult) (pediatric): Secondary | ICD-10-CM

## 2024-02-01 DIAGNOSIS — L03113 Cellulitis of right upper limb: Secondary | ICD-10-CM

## 2024-02-01 DIAGNOSIS — I52 Other heart disorders in diseases classified elsewhere: Secondary | ICD-10-CM | POA: Diagnosis not present

## 2024-02-01 DIAGNOSIS — M1A0720 Idiopathic chronic gout, left ankle and foot, without tophus (tophi): Secondary | ICD-10-CM

## 2024-02-01 DIAGNOSIS — E1149 Type 2 diabetes mellitus with other diabetic neurological complication: Secondary | ICD-10-CM | POA: Diagnosis not present

## 2024-02-01 LAB — COMPREHENSIVE METABOLIC PANEL WITH GFR
ALT: 18 U/L (ref 0–44)
AST: 21 U/L (ref 15–41)
Albumin: 2.6 g/dL — ABNORMAL LOW (ref 3.5–5.0)
Alkaline Phosphatase: 142 U/L — ABNORMAL HIGH (ref 38–126)
Anion gap: 13 (ref 5–15)
BUN: 24 mg/dL — ABNORMAL HIGH (ref 8–23)
CO2: 21 mmol/L — ABNORMAL LOW (ref 22–32)
Calcium: 8.7 mg/dL — ABNORMAL LOW (ref 8.9–10.3)
Chloride: 101 mmol/L (ref 98–111)
Creatinine, Ser: 1.34 mg/dL — ABNORMAL HIGH (ref 0.44–1.00)
GFR, Estimated: 41 mL/min — ABNORMAL LOW (ref >60)
Glucose, Bld: 364 mg/dL — ABNORMAL HIGH (ref 70–99)
Potassium: 5.2 mmol/L — ABNORMAL HIGH (ref 3.5–5.1)
Sodium: 135 mmol/L (ref 135–145)
Total Bilirubin: 0.7 mg/dL (ref 0.0–1.2)
Total Protein: 6.8 g/dL (ref 6.5–8.1)

## 2024-02-01 LAB — GLUCOSE, CAPILLARY
Glucose-Capillary: 303 mg/dL — ABNORMAL HIGH (ref 70–99)
Glucose-Capillary: 380 mg/dL — ABNORMAL HIGH (ref 70–99)
Glucose-Capillary: 439 mg/dL — ABNORMAL HIGH (ref 70–99)
Glucose-Capillary: 423 mg/dL — ABNORMAL HIGH (ref 70–99)

## 2024-02-01 LAB — CBC
HCT: 39.2 % (ref 36.0–46.0)
Hemoglobin: 12.1 g/dL (ref 12.0–15.0)
MCH: 28.3 pg (ref 26.0–34.0)
MCHC: 30.9 g/dL (ref 30.0–36.0)
MCV: 91.6 fL (ref 80.0–100.0)
Platelets: 461 K/uL — ABNORMAL HIGH (ref 150–400)
RBC: 4.28 MIL/uL (ref 3.87–5.11)
RDW: 15.8 % — ABNORMAL HIGH (ref 11.5–15.5)
WBC: 10.5 K/uL (ref 4.0–10.5)
nRBC: 0 % (ref 0.0–0.2)

## 2024-02-01 LAB — PROTIME-INR
INR: 1.6 — ABNORMAL HIGH (ref 0.8–1.2)
Prothrombin Time: 19.9 s — ABNORMAL HIGH (ref 11.4–15.2)

## 2024-02-01 LAB — GLUCOSE, RANDOM: Glucose, Bld: 386 mg/dL — ABNORMAL HIGH (ref 70–99)

## 2024-02-01 MED ORDER — INSULIN DEGLUDEC 100 UNIT/ML ~~LOC~~ SOPN: 15.0000 [IU] | PEN_INJECTOR | Freq: Every day | SUBCUTANEOUS | Status: DC

## 2024-02-01 MED ORDER — DULOXETINE HCL 30 MG PO CPEP
60.0000 mg | ORAL_CAPSULE | Freq: Every day | ORAL | Status: DC
  Administered 2024-02-02: 60 mg via ORAL
  Filled 2024-02-01 (×2): qty 2

## 2024-02-01 MED ORDER — PANCRELIPASE (LIP-PROT-AMYL) 36000-114000 UNITS PO CPEP
72000.0000 [IU] | ORAL_CAPSULE | Freq: Three times a day (TID) | ORAL | Status: DC
  Administered 2024-02-02 – 2024-02-03 (×4): 72000 [IU] via ORAL
  Filled 2024-02-01: qty 6
  Filled 2024-02-01: qty 2
  Filled 2024-02-01 (×4): qty 6

## 2024-02-01 MED ORDER — INSULIN ASPART 100 UNIT/ML IJ SOLN
20.0000 [IU] | Freq: Once | INTRAMUSCULAR | Status: AC
  Administered 2024-02-01: 20 [IU] via SUBCUTANEOUS
  Filled 2024-02-01: qty 1

## 2024-02-01 MED ORDER — PANTOPRAZOLE SODIUM 40 MG PO TBEC
40.0000 mg | DELAYED_RELEASE_TABLET | Freq: Two times a day (BID) | ORAL | Status: DC
  Administered 2024-02-01 – 2024-02-03 (×5): 40 mg via ORAL
  Filled 2024-02-01 (×5): qty 1

## 2024-02-01 MED ORDER — FINERENONE 10 MG PO TABS: 10.0000 mg | ORAL_TABLET | Freq: Every morning | ORAL | Status: DC

## 2024-02-01 MED ORDER — INSULIN GLARGINE-YFGN 100 UNIT/ML ~~LOC~~ SOLN
20.0000 [IU] | Freq: Every day | SUBCUTANEOUS | Status: DC
  Administered 2024-02-01: 20 [IU] via SUBCUTANEOUS
  Filled 2024-02-01 (×2): qty 0.2

## 2024-02-01 MED ORDER — PANCRELIPASE (LIP-PROT-AMYL) 12000-38000 UNITS PO CPEP
36000.0000 [IU] | ORAL_CAPSULE | ORAL | Status: DC
  Administered 2024-02-01 – 2024-02-03 (×2): 36000 [IU] via ORAL
  Filled 2024-02-01 (×2): qty 3

## 2024-02-01 MED ORDER — PREDNISONE 20 MG PO TABS
20.0000 mg | ORAL_TABLET | Freq: Every day | ORAL | Status: DC
  Administered 2024-02-02: 20 mg via ORAL
  Filled 2024-02-01: qty 1

## 2024-02-01 MED ORDER — EZETIMIBE 10 MG PO TABS
10.0000 mg | ORAL_TABLET | Freq: Every day | ORAL | Status: DC
  Administered 2024-02-02 – 2024-02-03 (×2): 10 mg via ORAL
  Filled 2024-02-01 (×2): qty 1

## 2024-02-01 MED ORDER — TORSEMIDE 20 MG PO TABS
20.0000 mg | ORAL_TABLET | Freq: Every day | ORAL | Status: DC
Start: 2024-02-01 — End: 2024-02-03
  Administered 2024-02-01 – 2024-02-03 (×3): 20 mg via ORAL
  Filled 2024-02-01 (×3): qty 1

## 2024-02-01 MED ORDER — INSULIN ASPART 100 UNIT/ML IJ SOLN
0.0000 [IU] | Freq: Three times a day (TID) | INTRAMUSCULAR | Status: DC
  Administered 2024-02-02: 15 [IU] via SUBCUTANEOUS
  Administered 2024-02-02: 5 [IU] via SUBCUTANEOUS
  Administered 2024-02-02: 8 [IU] via SUBCUTANEOUS
  Administered 2024-02-03: 3 [IU] via SUBCUTANEOUS
  Administered 2024-02-03: 2 [IU] via SUBCUTANEOUS
  Filled 2024-02-01 (×5): qty 1

## 2024-02-01 MED ORDER — LORATADINE 10 MG PO TABS
10.0000 mg | ORAL_TABLET | Freq: Every day | ORAL | Status: DC
  Administered 2024-02-02 – 2024-02-03 (×2): 10 mg via ORAL
  Filled 2024-02-01 (×2): qty 1

## 2024-02-01 MED ORDER — TRAMADOL HCL 50 MG PO TABS
50.0000 mg | ORAL_TABLET | Freq: Four times a day (QID) | ORAL | Status: DC | PRN
  Administered 2024-02-01 – 2024-02-03 (×2): 50 mg via ORAL
  Filled 2024-02-01 (×2): qty 1

## 2024-02-01 MED ORDER — CEPHALEXIN 500 MG PO CAPS
500.0000 mg | ORAL_CAPSULE | Freq: Four times a day (QID) | ORAL | Status: DC
  Administered 2024-02-01 – 2024-02-03 (×9): 500 mg via ORAL
  Filled 2024-02-01 (×9): qty 1

## 2024-02-01 MED ORDER — PREGABALIN 50 MG PO CAPS
50.0000 mg | ORAL_CAPSULE | Freq: Two times a day (BID) | ORAL | Status: DC
  Administered 2024-02-01 – 2024-02-03 (×4): 50 mg via ORAL
  Filled 2024-02-01 (×4): qty 1

## 2024-02-01 NOTE — Consult Note (Signed)
 ORTHOPAEDIC CONSULTATION  REQUESTING PHYSICIAN: Tobie Calix, MD  Chief Complaint: Redness and swelling right hand  HPI: Denise Sharp is a 78 y.o. female who complains of redness and swelling to the right hand for the last 2 weeks.  Patient has multiple medical issues including diabetes, heart disease, kidney disease, morbid obesity, atrial fibrillation on Eliquis , history of gout.  She has irritable bowel problems hypertension.  The patient was seen at [facility] yesterday walk-in clinic cellulitis was diagnosed and was referred to the [facility] emergency room for admission for IV antibiotics which was done.  White blood count was 16,500 yesterday and is down to 10,500 today.  Her sed rate was 87 1125.  Her uric acid level is still elevated although she is on allopurinol  and colchicine .  She has been followed by Dr. Krystal Cedar the podiatrist for Charcot disease in the left ankle and foot.  She has an ankle-foot orthosis for that.  She says the redness and swelling and pain is better in the hand and wrist today.  She has significant stiffness of the fingers since she has not been moving them for the last 2 weeks.  He has underlying arthritis in the small joints of the fingers as well.  Past Medical History:  Diagnosis Date   (HFpEF) heart failure with preserved ejection fraction (HCC)    a. 12/2019 Echo: EF 65-70%, Gr2 DD. No significant valvular dzs.   Abnormality of gait 03/25/2013   Adrenal mass, left (HCC)    Anginal pain (HCC)    Anxiety    Aortic atherosclerosis (HCC)    Arthritis    Asthma    CAD (coronary artery disease) with h/o Atypical Chest Pain    a. 04/1986 Cath ([facility]): nl cors, EF 65%; b. 07/2012 Cath [facility]): Diff minor irregs; c. 07/2016 MV [facility]): Equivocal; d. 08/2016 Cardiac CT Ca2+ score [facility]): Ca2+ 1548; e. 05/2019 MV: No ischemia. EF 75%; f. 12/2019 PCI: LM nl, LAD 65ost (3.5x12 Resolute DES), 38m (2.75x12 Resolute DES),LCX/RCA nl; g. 08/2020 Cath: patent LAD stents, RCA  40ost, elev RH pressures, EF >65%.   Cervical spinal stenosis 1994   due to trauma to back ([employer] accident), has intermittent paralysis and parasthesias   Cervicogenic headache 03/23/2014   CKD (chronic kidney disease), stage III (HCC)    Depression    Diverticulosis    Dizziness    a.) chronic   DJD (degenerative joint disease)    a. Chronic R shoulder pain   Dyspnea    Esophageal stenosis 09/2009   a.) transient outlet obstruction by food, cleared by EGD   Family history of adverse reaction to anesthesia    a.) daughter with (+) PONV   Gastric bypass status for obesity    GERD (gastroesophageal reflux disease)    Gout    Headache(784.0)    HLD (hyperlipidemia)    Hypertension    IBS (irritable bowel syndrome)    IDA (iron  deficiency anemia)    a.) post 2 unit txfsn 2009, normal endo/colonoscopy by [facility]   Left bundle branch block (LBBB)    a.) Intermittently present - likely rate related.   NSVT (nonsustained ventricular tachycardia) (HCC) 11/15/2020   a.) Holter study 11/15/2020; 11 episodes of NSVT lasting up to 8 beats with a maximum rate of 210 bpm; 14 atrial runs lasting up to 18 beats with a rate of up to 250 bpm.  Some atrial runs felt to be NSVT.   NSVT (nonsustained ventricular tachycardia) (HCC)  a. 10/2020 [facility]: 11 runs of NSVT up to 8 beats.   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 10/2020 [facility]: 14 atrial runs up to 18 beats, max HR 250.   Restless leg syndrome    Rotator cuff arthropathy, right 08/13/2017   Syncope and collapse 03/12/2014   Type II diabetes mellitus (HCC)    Past Surgical History:  Procedure Laterality Date   APPENDECTOMY     BACK SURGERY     CARDIOVASCULAR STRESS TEST     CARDIOVERSION N/A 10/30/2022   Procedure: CARDIOVERSION;  Surgeon: Mady Bruckner, MD;  Location: [facility] ORS;   Service: Cardiovascular;  Laterality: N/A;   CHOLECYSTECTOMY N/A 1976   COLONOSCOPY     CORONARY ANGIOPLASTY     CORONARY STENT INTERVENTION N/A 01/04/2020   Procedure: CORONARY STENT INTERVENTION;  Surgeon: Darron Deatrice LABOR, MD;  Location: [facility] INVASIVE CV LAB;  Service: Cardiovascular;  Laterality: N/A;  LAD    DIAPHRAGMATIC HERNIA REPAIR  2015   ESOPHAGEAL DILATION     multiple   ESOPHAGOGASTRODUODENOSCOPY (EGD) WITH PROPOFOL  N/A 05/11/2021   Procedure: ESOPHAGOGASTRODUODENOSCOPY (EGD) WITH PROPOFOL ;  Surgeon: Jinny Carmine, MD;  Location: [facility] ENDOSCOPY;  Service: Endoscopy;  Laterality: N/A;   ESOPHAGOGASTRODUODENOSCOPY (EGD) WITH PROPOFOL  N/A 06/13/2021   Procedure: ESOPHAGOGASTRODUODENOSCOPY (EGD) WITH PROPOFOL ;  Surgeon: Jinny Carmine, MD;  Location: [facility] END[facility];  Service: Endoscopy;  Laterality: N/A;   ESOPHAGOGASTRODUODENOSCOPY (EGD) WITH PROPOFOL  N/A 02/28/2023   Procedure: ESOPHAGOGASTRODUODENOSCOPY (EGD) WITH PROPOFOL ;  Surgeon: Jinny Carmine, MD;  Location: [facility] END[facility];  Service: Endoscopy;  Laterality: N/A;   EYE SURGERY     GASTRIC BYPASS  2000, 2005   Dr. Mia REDBIRD IMPLANT PLACEMENT  10/2011   Ike REDBIRD IMPLANT PLACEMENT N/A 09/18/2021   Procedure: REDBIRD IMPLANT FIRST STAGE;  Surgeon: Gaston Hamilton, MD;  Location: [facility];  Service: Urology;  Laterality: N/A;   INTERSTIM IMPLANT PLACEMENT N/A 09/18/2021   Procedure: REDBIRD IMPLANT SECOND STAGE WITH IMPEDENCE CHECK;  Surgeon: Gaston Hamilton, MD;  Location: [facility];  Service: Urology;  Laterality: N/A;   INTERSTIM IMPLANT REMOVAL N/A 09/18/2021   Procedure: REMOVAL OF INTERSTIM IMPLANT;  Surgeon: Gaston Hamilton, MD;  Location: [facility];  Service: Urology;  Laterality: N/A;   LEFT HEART CATH AND CORONARY ANGIOGRAPHY N/A 04/1986   LEFT HEART CATH AND CORONARY ANGIOGRAPHY Left 07/24/2012   LEFT HEART CATH AND CORS/GRAFTS ANGIOGRAPHY N/A 12/31/2019   Procedure: LEFT HEART CATH AND  CORS/GRAFTS ANGIOGRAPHY;  Surgeon: Perla Evalene PARAS, MD;  Location: [facility];  Service: Cardiovascular;  Laterality: N/A;   PANNICULECTOMY N/A 06/16/2019   Procedure: PANNICULECTOMY;  Surgeon: Elisabeth Craig RAMAN, MD;  Location: [facility];  Service: Plastics;  Laterality: N/A;  3 hours, please   REVERSE SHOULDER ARTHROPLASTY Right 08/13/2017   Procedure: REVERSE RIGHT SHOULDER ARTHROPLASTY;  Surgeon: Josefina Chew, MD;  Location: [facility] OR;  Service: Orthopedics;  Laterality: Right;   RIGHT/LEFT HEART CATH AND CORONARY ANGIOGRAPHY N/A 09/06/2020   Procedure: RIGHT/LEFT HEART CATH AND CORONARY ANGIOGRAPHY;  Surgeon: Mady Bruckner, MD;  Location: [facility];  Service: Cardiovascular;  Laterality: N/A;   RIGHT/LEFT HEART CATH AND CORONARY ANGIOGRAPHY N/A 06/04/2022   Procedure: RIGHT/LEFT HEART CATH AND CORONARY ANGIOGRAPHY;  Surgeon: Mady Bruckner, MD;  Location: [facility];  Service: Cardiovascular;  Laterality: N/A;  ROTATOR CUFF REPAIR Right    SPINE SURGERY  1995   [place]   TOOTH EXTRACTION  11/13/2021   TOTAL ABDOMINAL HYSTERECTOMY W/ BILATERAL SALPINGOOPHORECTOMY  1974   TOTAL KNEE ARTHROPLASTY Bilateral 2007   Procedure: TOTAL KNEE ARTHROPLASTY; Surgeons: Kathi, MD and [name], MD   UMBILICAL HERNIA REPAIR  08/11/2015   Social History   Socioeconomic History   Marital status: Married    Spouse name: Koren    Number of children: 2   Years of education: College    Highest education level: Not on file  Occupational History    Employer: retired  Tobacco Use   Smoking status: Never    Passive exposure: Never   Smokeless tobacco: Never  Vaping Use   Vaping status: Never Used  Substance and Sexual Activity   Alcohol  use: No   Drug use: Not Currently    Comment: prescribed valium    Sexual activity: Not Currently    Birth control/protection: Post-menopausal, Surgical  Other Topics Concern   Not on file  Social History Narrative   Patient lives at home  with husband Koren.    Right Handed   Drinks caffeinated tea occasionally   One pet in house, dog   Social Drivers of Health   Financial Resource Strain: Low Risk  (09/15/2021)   Overall Financial Resource Strain (CARDIA)    Difficulty of Paying Living Expenses: Not hard at all  Food Insecurity: No Food Insecurity (04/05/2023)   Hunger Vital Sign    Worried About Running Out of Food in the Last Year: Never true    Ran Out of Food in the Last Year: Never true  Transportation Needs: No Transportation Needs (04/05/2023)   PRAPARE - Administrator, Civil Service (Medical): No    Lack of Transportation (Non-Medical): No  Physical Activity: Insufficiently Active (11/15/2020)   Exercise Vital Sign    Days of Exercise per Week: 2 days    Minutes of Exercise per Session: 40 min  Stress: Stress Concern Present (08/03/2020)   [employer] of Occupational Health - Occupational Stress Questionnaire    Feeling of Stress : Rather much  Social Connections: Not on file   Family History  Problem Relation Age of Onset   Heart disease Father    Hypertension Father    Prostate cancer Father    Stroke Father    Osteoporosis Father    Stroke Mother    Depression Mother    Headache Mother    Heart disease Mother    Thyroid  disease Mother    Hypertension Mother    Diabetes Daughter    Heart disease Daughter    Hypertension Daughter    Hypertension Son    Allergies  Allergen Reactions   Amoxicillin  Diarrhea    amoxicillin    Clarithromycin  Nausea And Vomiting and Other (See Comments)    Severe irritable bowel  clarithromycin    Demeclocycline Hives   Erythromycin Nausea And Vomiting and Other (See Comments)    Severe irritable bowel   Flagyl [Metronidazole] Nausea And Vomiting and Other (See Comments)    Severe irritable bowel   Glucophage [Metformin Hcl] Nausea And Vomiting and Other (See Comments)    Sick I won't take anything that has metformin in it   Tetracyclines &  Related Hives and Rash   Potassium Chloride  Other (See Comments)    Cannot swallow potssium pills, able to tolerate liquid potassium   Tetracycline Other (See Comments)   Diovan [Valsartan] Nausea Only  Sulfa  Antibiotics Other (See Comments) and Rash    As child   Xanax [Alprazolam] Other (See Comments)    Hyperactivity    Prior to Admission medications   Medication Sig Start Date End Date Taking? Authorizing Provider  allopurinol  (ZYLOPRIM ) 100 MG tablet Take 1 tablet (100 mg total) by mouth daily. 01/24/24  Yes Rice, Lonni ORN, MD  apixaban  (ELIQUIS ) 5 MG TABS tablet Take 1 tablet (5 mg total) by mouth 2 (two) times daily. 06/19/23  Yes End, Lonni, MD  atorvastatin  (LIPITOR ) 80 MG tablet Take 1 tablet (80 mg total) by mouth daily. 01/09/24  Yes Marylynn Verneita CROME, MD  budesonide -formoterol  (SYMBICORT ) 160-4.5 MCG/ACT inhaler Inhale 2 puffs into the lungs 2 (two) times daily. 03/07/23  Yes Sood, Vineet, MD  butalbital -acetaminophen -caffeine  (FIORICET ) 50-325-40 MG tablet TAKE 1 TABLET BY MOUTH EVERY 6 HOURS AS NEEDED FOR HEADACHE. 07/23/22  Yes Marylynn Verneita CROME, MD  Cholecalciferol  (VITAMIN D3) 250 MCG (10000 UT) capsule Take 10,000 Units by mouth daily.   Yes [provider]  clotrimazole -betamethasone  (LOTRISONE ) cream Apply 1 application topically 2 (two) times daily as needed (irritation).   Yes [provider]  conjugated estrogens  (PREMARIN ) vaginal cream Apply one pea-sized amount around the opening of the urethra daily for 2 weeks, then 3 times weekly moving forward. 04/20/22  Yes Vaillancourt, Samantha, PA-C  cyanocobalamin  (VITAMIN B12) 1000 MCG/ML injection Inject 1 mL (1,000 mcg total) into the muscle every 30 (thirty) days. Inject 1 ml (1000 mcg ) IM weekly x 4,  Then monthly thereafter 10/09/23  Yes Marylynn Verneita CROME, MD  dapagliflozin  propanediol (FARXIGA ) 10 MG TABS tablet Take 1 tablet (10 mg total) by mouth daily. 01/07/24  Yes Therisa Benton PARAS, NP   dicyclomine  (BENTYL ) 20 MG tablet Take 1 tablet (20 mg total) by mouth 3 (three) times daily before meals. 01/09/24  Yes Marylynn Verneita CROME, MD  ezetimibe  (ZETIA ) 10 MG tablet Take 1 tablet (10 mg total) by mouth daily. 01/09/24 04/08/24 Yes Marylynn Verneita CROME, MD  fexofenadine (ALLEGRA) 180 MG tablet Take 180 mg by mouth as needed for allergies or rhinitis.   Yes [provider]  Finerenone  (KERENDIA ) 10 MG TABS Take 1 tablet (10 mg total) by mouth in the morning. 10/21/23  Yes Furth, Cadence H, PA-C  fluticasone  (FLONASE ) 50 MCG/ACT nasal spray Place 2 sprays into both nostrils daily. 04/11/22  Yes Maribeth Camellia MATSU, MD  insulin  aspart (NOVOLOG  FLEXPEN) 100 UNIT/ML FlexPen Inject 8-14 Units into the skin 3 (three) times daily with meals. 01/07/24  Yes Therisa Benton PARAS, NP  insulin  degludec (TRESIBA  FLEXTOUCH) 100 UNIT/ML FlexTouch Pen Inject 15 Units into the skin daily. 01/07/24  Yes Reardon, Benton PARAS, NP  lidocaine  (LIDODERM ) 5 % PLACE 1 PATCH ONTO THE SKIN DAILY. REMOVE & DISCARD PATCH WITHIN 12 HOURS OR AS DIRECTED BY MD 01/16/24  Yes Marylynn Verneita CROME, MD  lipase/protease/amylase (CREON ) 36000 UNITS CPEP capsule Take 2 capsules (72,000 Units total) by mouth 3 (three) times daily before meals AND 1 capsule (36,000 Units total) with snacks. 03/08/23 03/02/24 Yes [place], PA-C  loperamide  (IMODIUM ) 2 MG capsule Take 4-6 mg by mouth 3 (three) times daily before meals.   Yes [provider]  nitroGLYCERIN  (NITROSTAT ) 0.4 MG SL tablet Place 1 tablet (0.4 mg total) under the tongue every 5 (five) minutes as needed for chest pain. 04/14/23  Yes Marylynn Verneita CROME, MD  nystatin  (MYCOSTATIN /NYSTOP ) powder Apply 1 g topically 4 (four) times daily as needed (rash).  Yes [provider]  ondansetron  (ZOFRAN -ODT) 4 MG disintegrating tablet Take 1 tablet (4 mg total) by mouth every 8 (eight) hours as needed for nausea or vomiting. 07/13/22  Yes Marylynn Verneita CROME, MD  pantoprazole  (PROTONIX ) 40 MG  tablet Take 1 tablet (40 mg total) by mouth daily. 03/04/23 02/27/24 Yes [place], PA-C  Polyethyl Glycol-Propyl Glycol (SYSTANE OP) Place 1 drop into both eyes 4 (four) times daily as needed (dry eyes).   Yes [provider]  potassium chloride  20 MEQ/15ML (10%) SOLN Take 15 mLs (20 mEq total) by mouth daily. 05/15/23  Yes End, Lonni, MD  pregabalin  (LYRICA ) 50 MG capsule Take 1 capsule (50 mg total) by mouth 2 (two) times daily. 08/01/23  Yes Marylynn Verneita CROME, MD  tiZANidine  (ZANAFLEX ) 4 MG tablet Take 1 tablet (4 mg total) by mouth in the morning and at bedtime. 12/19/23  Yes Bair, Kalpana, MD  torsemide  (DEMADEX ) 20 MG tablet Take 1 tablet (20 mg total) by mouth daily. 01/27/24 04/26/24 Yes Sabharwal, Aditya, DO  colchicine  0.6 MG tablet Take 1 tablet (0.6 mg total) by mouth 2 (two) times daily. Patient not taking: No sig reported 12/19/23   Bair, Kalpana, MD  Continuous Glucose Sensor (FREESTYLE LIBRE 3 PLUS SENSOR) MISC Change sensor every 15 days. 01/07/24   Therisa Benton PARAS, NP  DULoxetine  (CYMBALTA ) 60 MG capsule Take 1 capsule (60 mg total) by mouth daily. 05/01/23   Gayland Lauraine PARAS, NP  Glucagon  (GVOKE HYPOPEN  2-PACK) 0.5 MG/0.1ML SOAJ Inject 1 auto injector subcutaneously as needed for severe hypoglycemia. May repeat x 1 after 15 minutes if needed. 09/25/22   Gretel App, NP  glucose blood (ONETOUCH VERIO) test strip Use to check blood sugar twice daily. 02/23/22   Marylynn Verneita CROME, MD  hydrocortisone  (ANUSOL -HC) 25 MG suppository Place 1 suppository (25 mg total) rectally 2 (two) times daily. Patient not taking: Reported on 01/31/2024 04/01/23   Wouk, Devaughn Sayres, MD  Insulin  Pen Needle (DROPLET PEN NEEDLES) 31G X 5 MM MISC USE ONE NEEDLE SUBCUTANEOUSLY AS DIRECTED (REMOVE AND DISCARD NEEDLE IN SHARPS CONTAINER IMMEDIATELY AFTER USE) 09/30/23   Therisa Benton PARAS, NP  mometasone  (ELOCON ) 0.1 % cream Apply topically. Patient not taking: Reported on 01/31/2024 12/13/15   [provider]  mupirocin  cream (BACTROBAN ) 2 % Apply topically. Patient not taking: Reported on 01/31/2024 02/02/16   [provider]  OneTouch Delica Lancets 33G MISC Used to check blood sugar two times a day. 02/22/22   Marylynn Verneita CROME, MD  OVER THE COUNTER MEDICATION Apply 1 application  topically daily as needed (pain). [place] Gel topical pain reliever    [provider]   No results found.  Positive ROS: All other systems have been reviewed and were otherwise negative with the exception of those mentioned in the HPI and as above.  Physical Exam: General: Alert, no acute distress Cardiovascular: No pedal edema Respiratory: No cyanosis, no use of accessory musculature GI: No organomegaly, abdomen is soft and non-tender Skin: No lesions in the area of chief complaint Neurologic: Sensation intact distally Psychiatric: Patient is competent for consent with normal mood and affect Lymphatic: No axillary or cervical lymphadenopathy  MUSCULOSKELETAL: There is mild warmth to the dorsum of the right wrist and hand.  There is very slight pink discoloration.  There is no severe cellulitis noted today.  Fingers are still somewhat swollen and there is arthritis at the PIP and DIP joints.  She has flexion to about 45 degrees  of the PIP joint in full extension.  Sensations good distally.  Elbow and shoulder are unremarkable.  X-rays of the right hand and wrist show arthritis with no evidence of any bony infection.    Assessment: Resolving cellulitis right hand and wrist  Plan: Convert to oral antibiotics as tolerated Use warm saline soaks to try and improve flexibility of the fingers. Follow-up in 5 days for evaluation of the cellulitis.    Kayla FORBES [place], MD [phone]   02/01/2024 11:31 AM

## 2024-02-01 NOTE — Progress Notes (Signed)
 [facility]  - [place] at [facility]   PATIENT NAME: Denise Sharp    MR#:  [id number]  DATE OF BIRTH:  [birth year]  SUBJECTIVE:  daughter at bedside. Patient came in with redness and swelling along with pain of the right upper forearm and hand and left ankle/foot for several months worsened in last couple days. 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 She has been basically bedbound and wheelchair-bound uses an office chair to get around the house per daughter. They have got prescription for wheelchair however not able to obtain it. Redness of the arm and foot better. No fever. Does have some swelling in the right hand    VITALS:  Blood pressure (!) 134/53, pulse 80, temperature 97.7 F (36.5 C), temperature source Oral, resp. rate 18, SpO2 97%.  PHYSICAL EXAMINATION:   GENERAL:  78 y.o.-year-old patient with no acute distress. Morbidly obese LUNGS: Normal breath sounds bilaterally, no wheezing CARDIOVASCULAR: S1, S2 normal. No murmur   ABDOMEN: Soft, nontender, nondistended. Bowel sounds present.  EXTREMITIES: upper and lower extremity DJD with more prominent in the right upper extremity and left foot/ankle   LABORATORY PANEL:  CBC Recent Labs  Lab 02/01/24 0414  WBC 10.5  HGB 12.1  HCT 39.2  PLT 461*    Chemistries  Recent Labs  Lab 02/01/24 0414  NA 135  K 5.2*  CL 101  CO2 21*  GLUCOSE 364*  BUN 24*  CREATININE 1.34*  CALCIUM  8.7*  AST 21  ALT 18  ALKPHOS 142*  BILITOT 0.7    Assessment and Plan   Denise MURDOCH is a pleasant 78 y.o. female with medical history significant for HFpEF, CAD, atrial fibrillation, type II DM, CKD, morbid obesity who presented with right hand pain and swelling of the left ankle for the last 2 weeks, worsening over the last several days and associated with redness.  She stated that pain is 7/10, continuous, worsening with the movement and better with the pain  medications.  The swelling is the worst over the distal part of her right hand knuckle mostly 2nd and 3rd digits.  She initially thought it might be gout.  She has seen Ortho today and told her to come to the emergency room with the concern for right hand cellulitis.   Right hand cellulitis with severe DJD -- improving. Redness resolved - ED patient received cefazolin  - blood cultures negative so far -oral Keflex  for 7 to 10 days - Start on pain medications Tylenol  and tramadol  -- PT OT to see patient   Left ankle chronic swelling  Severe Charcot arthropathy as noted on CT left ankle --- Patient was taking colchicine  in the past which will be continued -- Continue allopurinol   -  on steroid for couple days  - Pain medications  as mentioned above -seen by orthopedic Dr. Cleotilde no other recommendations then pain meds, PT and complete antibiotic course. He recommended patient use left leg boot while ambulation.   Diabetes type 2, uncontrolled, hypoglycemia in the setting of steroids -will resume long acting insulin  along with sliding scale insulin     OSA - Continue CPAP  Chronic atrial fibrillation of chronic anticoagulation -- resume home med  Urinary incontinence -- given patient obesity and unable to get to the bedside commode or bedpan will use purwick  POC for discharge planning to rehab  Procedures: Family communication : daughter at bedside Consults : orthopedic CODE STATUS: full DVT Prophylaxis : eliquis   Level of care: Med-Surg Status is: Observation The patient remains OBS appropriate and will d/c before 2 midnights.    TOTAL TIME TAKING CARE OF THIS PATIENT: 45 minutes.  >50% time spent on counselling and coordination of care  Note: This dictation was prepared with Dragon dictation along with smaller phrase technology. Any transcriptional errors that result from this process are unintentional.  Denise Sharp M.D    [facility]   CC: Primary care  physician; Denise Verneita CROME, MD

## 2024-02-01 NOTE — Evaluation (Signed)
 Occupational Therapy Evaluation Patient Details Name: Denise Sharp MRN: [id number] DOB: 02-13-1946 Today's Date: 02/01/2024   History of Present Illness   Denise Sharp is a pleasant 78 y.o. female with medical history significant for HFpEF, CAD, atrial fibrillation, type II DM, CKD, morbid obesity who presented with right hand pain and swelling of the left ankle for the last 2 weeks, worsening over the last several days and associated with redness.  She stated that pain is 7/10, continuous, worsening with the movement and better with the pain medications.  The swelling is the worst over the distal part of her right hand knuckle mostly 2nd and 3rd digits.     Clinical Impressions Pt was seen for OT evaluation this date. Prior to hospital admission, pt was amb with RW short household distances, limited community mobility with use of a scooter. Pt required assistance for all ADLs and IADLs from her husband who is not able to care for pt physically any longer due to his own health concerns. Pt presents to acute OT demonstrating impaired ADL performance, limited activity tolerance and functional mobility 2/2 (See OT problem list for additional functional deficits). Pt currently requires supervision for bed mobility with min verbal cues for hand placement. Pt required MAXA for donning bil socks and boot on LLE prior to step-pivot transfer from the EOB<>BSC with HHA throughout, MAXA for standing pericare. Pt declined attempting further mobility on this date due to stating too fatigued to attempt. Noted that pt is very self limiting and lacking internal motivation to progress physically despite verbal encouragement. Pt would benefit from skilled OT services to address noted impairments and functional limitations (see below for any additional details) in order to maximize safety and independence while minimizing falls risk and caregiver burden. OT will follow acutely.      If plan is discharge home,  recommend the following:   A lot of help with walking and/or transfers;A lot of help with bathing/dressing/bathroom;Assistance with cooking/housework;Assist for transportation;Help with stairs or ramp for entrance     Functional Status Assessment   Patient has had a recent decline in their functional status and demonstrates the ability to make significant improvements in function in a reasonable and predictable amount of time.     Equipment Recommendations   Other (comment) (Defer to next venue of care)     Recommendations for Other Services         Precautions/Restrictions   Precautions Precautions: Fall Recall of Precautions/Restrictions: Intact Restrictions Weight Bearing Restrictions Per Provider Order: Yes Other Position/Activity Restrictions: WBAT RUE, WBAT LLE with boot donned during mobility     Mobility Bed Mobility Overal bed mobility: Needs Assistance Bed Mobility: Supine to Sit, Sit to Supine     Supine to sit: Supervision, HOB elevated, Used rails Sit to supine: Supervision   General bed mobility comments: Verbal/tactile cues for hand placement on bed rails to self mobilize to the EOB    Transfers Overall transfer level: Needs assistance   Transfers: Bed to chair/wheelchair/BSC, Sit to/from Stand Sit to Stand: Min assist     Step pivot transfers: Min assist     General transfer comment: HHA for SPT, poor body awareness throughout, verbal cues for safety      Balance Overall balance assessment: Needs assistance Sitting-balance support: Feet supported, No upper extremity supported Sitting balance-Leahy Scale: Normal Sitting balance - Comments: Prolong sitting on the EOB with steady reaching within BOS   Standing balance support: During functional activity, Single extremity supported  Standing balance-Leahy Scale: Fair Standing balance comment: single UE support                           ADL either performed or assessed with  clinical judgement   ADL Overall ADL's : Needs assistance/impaired Eating/Feeding: Sitting;Set up   Grooming: Brushing hair;Bed level;Set up               Lower Body Dressing: Maximal assistance;Sitting/lateral leans Lower Body Dressing Details (indicate cue type and reason): Donning bil socks and boot prior to mobility Toilet Transfer: Ambulation;BSC/3in1;Minimal assistance Toilet Transfer Details (indicate cue type and reason): Step pivot t/f from EOB<>BSC Toileting- Clothing Manipulation and Hygiene: Maximal assistance;Sit to/from stand       Functional mobility during ADLs: Minimal assistance (Short distance to [facility] via HHA) General ADL Comments: MAXA pericare in standing post SPT from EOB     Vision         Perception         Praxis         Pertinent Vitals/Pain Pain Assessment Pain Assessment: Faces Faces Pain Scale: Hurts even more Pain Location: R UE Pain Descriptors / Indicators: Aching, Burning Pain Intervention(s): Monitored during session, Repositioned, Limited activity within patient's tolerance     Extremity/Trunk Assessment Upper Extremity Assessment Upper Extremity Assessment: Generalized weakness;RUE deficits/detail RUE Deficits / Details: Painful RA limiting ROM RUE: Unable to fully assess due to pain RUE Coordination: decreased fine motor;decreased gross motor   Lower Extremity Assessment Lower Extremity Assessment: LLE deficits/detail;RLE deficits/detail RLE Deficits / Details: 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 LEs LLE Deficits / Details: Cellitus, chronic neuropathy in bilfeet LLE: Unable to fully assess due to pain LLE Sensation: decreased light touch   Cervical / Trunk Assessment Cervical / Trunk Assessment: Normal   Communication Communication Communication: No apparent difficulties   Cognition Arousal: Alert Behavior During Therapy: WFL for tasks assessed/performed Cognition: No apparent impairments             OT -  Cognition Comments: A/Ox4                 Following commands: Intact       Cueing  General Comments   Cueing Techniques: Verbal cues      Exercises Exercises: Other exercises Other Exercises Other Exercises: Edu: Role of OT eval, benefits of rehab, safe ADL completion, DME recommendations, discharge planning   Shoulder Instructions      Home Living Family/patient expects to be discharged to:: Private residence Living Arrangements: Spouse/significant other   Type of Home: House Home Access: Ramped entrance     Home Layout: One level     Bathroom Shower/Tub: Walk-in Soil scientist Toilet: Handicapped height     Home Equipment: Agricultural consultant (2 wheels);Electric scooter;Shower seat;Cane - single point;Rollator (4 wheels);Shower seat - built in;Grab bars - toilet;Grab bars - tub/shower          Prior Functioning/Environment Prior Level of Function : Needs assist             Mobility Comments: Using office chair to roll around house, uses cane for short distances due to pain in hand. ADLs Comments: Husband assist with all ADL/IADL, however husband not in good health unable to help any longer.    OT Problem List: Decreased strength;Decreased activity tolerance;Decreased range of motion;Impaired balance (sitting and/or standing);Decreased coordination;Decreased safety awareness;Decreased knowledge of use of DME or AE   OT Treatment/Interventions: Self-care/ADL training;Therapeutic exercise;Energy conservation;DME  and/or AE instruction;Therapeutic activities;Patient/family education;Balance training      OT Goals(Current goals can be found in the care plan section)   Acute Rehab OT Goals Patient Stated Goal: go to rehab OT Goal Formulation: With patient Time For Goal Achievement: 02/15/24 Potential to Achieve Goals: Good ADL Goals Pt Will Perform Grooming: with contact guard assist;standing Pt Will Perform Lower Body Dressing: sit to/from  stand;with max assist Pt Will Transfer to Toilet: ambulating;with contact guard assist Pt Will Perform Toileting - Clothing Manipulation and hygiene: sit to/from stand;with mod assist   OT Frequency:  Min 2X/week    Co-evaluation              AM-PAC OT 6 Clicks Daily Activity     Outcome Measure Help from another person eating meals?: A Little Help from another person taking care of personal grooming?: A Lot Help from another person toileting, which includes using toliet, bedpan, or urinal?: A Lot Help from another person bathing (including washing, rinsing, drying)?: A Lot Help from another person to put on and taking off regular upper body clothing?: None Help from another person to put on and taking off regular lower body clothing?: A Lot 6 Click Score: 15   End of Session Equipment Utilized During Treatment: Gait belt Nurse Communication: Mobility status;Weight bearing status  Activity Tolerance: Patient tolerated treatment well Patient left: in bed;with call bell/phone within reach;with bed alarm set;with family/visitor present  OT Visit Diagnosis: Unsteadiness on feet (R26.81);Other abnormalities of gait and mobility (R26.89);Muscle weakness (generalized) (M62.81)                Time: 8662-8584 OT Time Calculation (min): 38 min Charges:  OT General Charges $OT Visit: 1 Visit OT Evaluation $OT Eval Moderate Complexity: 1 Mod OT Treatments $Self Care/Home Management : 23-37 mins  Larraine Colas M.S. OTR/L  02/01/24, 3:27 PM

## 2024-02-01 NOTE — Care Management Obs Status (Signed)
 MEDICARE OBSERVATION STATUS NOTIFICATION   Patient Details  Name: Denise Sharp MRN: [id number] Date of Birth: Dec 13, 1945   Medicare Observation Status Notification Given:  Yes    Rojelio SHAUNNA Rattler 02/01/2024, 1:44 PM

## 2024-02-01 NOTE — Progress Notes (Signed)
 Date and time results received: 02/01/24 1820 (use smartphrase .now to insert current time)  Test: CBG Critical Value: 439  Name of Provider Notified: Tobie  Orders Received? Or Actions Taken?: MD Tobie made aware. New orders in place see [facility]

## 2024-02-01 NOTE — Progress Notes (Signed)
 PT Cancellation Note  Patient Details Name: Denise Sharp MRN: [id number] DOB: Nov 17, 1945   Cancelled Treatment:    Reason Eval/Treat Not Completed: Medical issues which prohibited therapy (BG not within safe guidelines for exercise at this time: 340 in AM labs and 380 later today. Will continue to follow and evaluate once appropriate.)  3:42 PM, 02/01/24 Peggye JAYSON Linear, PT, DPT Physical Therapist - [facility]  [phone] (ASCOM)    Khamron Gellert C 02/01/2024, 3:42 PM

## 2024-02-02 DIAGNOSIS — G4733 Obstructive sleep apnea (adult) (pediatric): Secondary | ICD-10-CM | POA: Diagnosis not present

## 2024-02-02 DIAGNOSIS — M14672 Charcot's joint, left ankle and foot: Secondary | ICD-10-CM | POA: Diagnosis not present

## 2024-02-02 DIAGNOSIS — L03113 Cellulitis of right upper limb: Secondary | ICD-10-CM | POA: Diagnosis not present

## 2024-02-02 DIAGNOSIS — E1149 Type 2 diabetes mellitus with other diabetic neurological complication: Secondary | ICD-10-CM | POA: Diagnosis not present

## 2024-02-02 LAB — GLUCOSE, CAPILLARY
Glucose-Capillary: 202 mg/dL — ABNORMAL HIGH (ref 70–99)
Glucose-Capillary: 295 mg/dL — ABNORMAL HIGH (ref 70–99)
Glucose-Capillary: 371 mg/dL — ABNORMAL HIGH (ref 70–99)
Glucose-Capillary: 404 mg/dL — ABNORMAL HIGH (ref 70–99)
Glucose-Capillary: 227 mg/dL — ABNORMAL HIGH (ref 70–99)

## 2024-02-02 LAB — GLUCOSE, RANDOM: Glucose, Bld: 389 mg/dL — ABNORMAL HIGH (ref 70–99)

## 2024-02-02 MED ORDER — INSULIN GLARGINE-YFGN 100 UNIT/ML ~~LOC~~ SOLN: 25.0000 [IU] | Freq: Every day | SUBCUTANEOUS | Status: DC

## 2024-02-02 MED ORDER — DIPHENHYDRAMINE HCL 12.5 MG/5ML PO ELIX
12.5000 mg | ORAL_SOLUTION | Freq: Once | ORAL | Status: AC
  Administered 2024-02-02: 12.5 mg via ORAL
  Filled 2024-02-02: qty 5

## 2024-02-02 MED ORDER — INSULIN GLARGINE-YFGN 100 UNIT/ML ~~LOC~~ SOLN
25.0000 [IU] | Freq: Every day | SUBCUTANEOUS | Status: DC
  Administered 2024-02-02: 25 [IU] via SUBCUTANEOUS
  Filled 2024-02-02 (×2): qty 0.25

## 2024-02-02 MED ORDER — PREDNISONE 20 MG PO TABS
20.0000 mg | ORAL_TABLET | Freq: Every day | ORAL | Status: DC
  Administered 2024-02-03: 20 mg via ORAL
  Filled 2024-02-02: qty 1

## 2024-02-02 MED ORDER — INSULIN ASPART 100 UNIT/ML IJ SOLN
15.0000 [IU] | Freq: Once | INTRAMUSCULAR | Status: AC
  Administered 2024-02-02: 15 [IU] via SUBCUTANEOUS
  Filled 2024-02-02: qty 1

## 2024-02-02 NOTE — Progress Notes (Signed)
 [facility]  - [place] at [facility]   PATIENT NAME: Denise Sharp    MR#:  [id number]  DATE OF BIRTH:  April 01, 1946  SUBJECTIVE:  no family at bedside during my evaluation. Patient was working with PT. She used her left ankle boot and took steps with rolling walker to her recliner. Right hand swelling improving slowly continues with some pain. Sugar is high.   VITALS:  Blood pressure (!) 124/54, pulse (!) 58, temperature 97.6 F (36.4 C), resp. rate 18, SpO2 100%.  PHYSICAL EXAMINATION:   GENERAL:  78 y.o.-year-old patient with no acute distress. Morbidly obese LUNGS: Normal breath sounds bilaterally, no wheezing CARDIOVASCULAR: S1, S2 normal. No murmur   ABDOMEN: Soft, nontender, nondistended. Bowel sounds present.  EXTREMITIES: upper and lower extremity DJD with more prominent in the right upper extremity and left foot/ankle   LABORATORY PANEL:  CBC Recent Labs  Lab 02/01/24 0414  WBC 10.5  HGB 12.1  HCT 39.2  PLT 461*    Chemistries  Recent Labs  Lab 02/01/24 0414 02/01/24 2145  NA 135  --   K 5.2*  --   CL 101  --   CO2 21*  --   GLUCOSE 364* 386*  BUN 24*  --   CREATININE 1.34*  --   CALCIUM  8.7*  --   AST 21  --   ALT 18  --   ALKPHOS 142*  --   BILITOT 0.7  --     Assessment and Plan   Denise Sharp is a pleasant 78 y.o. female with medical history significant for HFpEF, CAD, atrial fibrillation, type II DM, CKD, morbid obesity who presented with right hand pain and swelling of the left ankle for the last 2 weeks, worsening over the last several days and associated with redness.  She stated that pain is 7/10, continuous, worsening with the movement and better with the pain medications.  The swelling is the worst over the distal part of her right hand knuckle mostly 2nd and 3rd digits.  She initially thought it might be gout.  She has seen Ortho today and told her to come to the emergency room with the concern for right hand  cellulitis.   Right hand cellulitis with severe DJD -- improving. Redness resolved - ED patient received cefazolin  - blood cultures negative so far -oral Keflex  for 7 to 10 days - Start on pain medications Tylenol  and tramadol  -- PT OT to see patient--recommends rehab   Left ankle chronic swelling  Severe Charcot arthropathy as noted on CT left ankle --- Patient was taking colchicine  in the past which will be continued -- Continue allopurinol   -  on steroid for couple days  - Pain medications  as mentioned above -seen by orthopedic Dr. Cleotilde no other recommendations then pain meds, PT and complete antibiotic course. He recommended patient use left leg boot while ambulation.   Diabetes type 2, uncontrolled, hypoglycemia in the setting of steroids -will resume long acting insulin  along with sliding scale insulin     OSA - Continue CPAP  Chronic atrial fibrillation of chronic anticoagulation -- resume home med  Urinary incontinence -- given patient obesity and unable to get to the bedside commode or bedpan will use purwick  POC for discharge planning to rehab  Procedures: Family communication : daughter at bedside Consults : orthopedic CODE STATUS: full DVT Prophylaxis : eliquis  Level of care: Med-Surg Status is: Observation The patient remains OBS appropriate and will d/c before 2  midnights.    TOTAL TIME TAKING CARE OF THIS PATIENT: 45 minutes.  >50% time spent on counselling and coordination of care  Note: This dictation was prepared with Dragon dictation along with smaller phrase technology. Any transcriptional errors that result from this process are unintentional.  Yanissa Michalsky M.D    [facility]   CC: Primary care physician; Marylynn Verneita CROME, MD

## 2024-02-02 NOTE — [place] FL2 (Signed)
 [place]  MEDICAID FL2 LEVEL OF CARE FORM     IDENTIFICATION  Patient Name: Denise Sharp Birthdate: August 21, 1945 Sex: female Admission Date (Current Location): 01/31/2024  [place] and [place] Number:  Chiropodist and Address:  [facility], [address], [place], [place] [address]      Provider Number: [id number]  Attending Physician Name and Address:  Tobie Calix, MD  Relative Name and Phone Number:  Zoha Spranger [phone]    Current Level of Care: Hospital Recommended Level of Care: Skilled Nursing Facility Prior Approval Number:    Date Approved/Denied:   PASRR Number: [id number]  Discharge Plan: SNF    Current Diagnoses: Patient Active Problem List   Diagnosis Date Noted   Charcot joint of left foot 02/02/2024   Cellulitis 01/31/2024   Gout of left ankle 01/31/2024   Acute gout due to renal impairment involving toe of left foot 12/19/2023   Foot cramps 12/19/2023   Pain in left ankle and joints of left foot 12/19/2023   Osteoarthritis of left foot 12/19/2023   Gouty arthritis of hand 10/10/2023   Hematochezia 05/15/2023   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   Persistent atrial fibrillation (HCC) 09/27/2022   Hypoglycemia due to insulin  09/24/2022   COVID-19 virus infection 09/01/2022   Abdominal bloating 07/25/2022   Cerebral vascular disease 06/11/2022   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   Orthostatic dizziness 04/03/2022   Gait abnormality 04/03/2022   Hx of amaurosis fugax 02/06/2022   Protein-calorie malnutrition (HCC) 11/21/2021   Absolute anemia 10/19/2021   Chronic neck pain 09/12/2021   PAC (premature atrial contraction) 08/19/2021       08/03/2021   Stricture and stenosis of esophagus    Pain in joint of right knee 05/17/2021   Sprain of right ankle 05/17/2021   Sprain of right knee 05/17/2021   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/09/2021   PSVT (paroxysmal  supraventricular tachycardia) (HCC) 12/15/2020   CKD stage 3 due to type 2 diabetes mellitus (HCC) 12/09/2020   Other pulmonary embolism with acute cor pulmonale, unspecified chronicity (HCC) 11/17/2020   Mild episode of recurrent major depressive disorder (HCC) 11/17/2020   Hyperlipidemia associated with type 2 diabetes mellitus (HCC) 08/21/2020   Iron  deficiency 06/28/2020   Elevated alkaline phosphatase level 06/10/2020   Chronic heart failure with preserved ejection fraction (HFpEF) (HCC) 01/14/2020   Coronary artery disease of native artery of native heart with stable angina pectoris (HCC) 01/13/2020   S/P cardiac catheterization 01/13/2020   Accelerating angina (HCC) 12/31/2019   Dysphagia 11/30/2019   Bladder pain 08/23/2019   Gastritis 08/07/2019   Lab test negative for COVID-19 virus 08/07/2019   S/P panniculectomy 06/16/2019   Major depressive disorder with current active episode 05/30/2019   Functional diarrhea 10/22/2018   Bilateral cataracts 06/26/2018   History of gastric bypass 02/05/2018   GAD (generalized anxiety disorder) 02/01/2018   Rectocele 01/24/2018   Vaginal prolapse 01/04/2018   Hepatic steatosis 01/04/2018   Microalbuminuria due to type 2 diabetes mellitus (HCC) 11/21/2017   Hospital discharge follow-up 09/22/2017   Fecal incontinence 09/22/2017   B12 deficiency 09/22/2017   Rotator cuff arthropathy, right 08/13/2017   Primary localized osteoarthrosis of shoulder 08/13/2017   Encounter for preoperative examination for general surgical procedure 08/03/2017   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) 08/03/2017   Candidiasis, intertrigo 08/03/2017   Venous stasis dermatitis of right lower extremity 11/21/2016   Chronic anxiety 10/11/2015  Hypersomnia, persistent 06/18/2015   Mechanical breakdown of implanted electronic neurostimulator of peripheral nerve (HCC) 10/16/2014   Obesity hypoventilation syndrome (HCC) 08/13/2014   Frequent  falls 06/23/2014   Chronic cough 05/05/2014   Adenoma of left adrenal gland 03/24/2014   Chronic intractable headache 03/23/2014   Syncope and collapse 03/12/2014   Vitamin D  deficiency 01/06/2014   Rectal bleeding 12/03/2013   Weight gain following gastric bypass surgery 12/03/2013   Morbid obesity (HCC) 08/25/2013   Weakness 08/04/2013   Diaphragmatic hernia 04/06/2013   Diabetic polyneuropathy (HCC) 03/25/2013   Abnormality of gait 03/25/2013   Multiple pulmonary nodules 03/20/2013   DOE (dyspnea on exertion) 03/20/2013   Iron  deficiency anemia 08/06/2012   Functional disorder of bladder 07/15/2012   Urge incontinence 07/15/2012   Detrusor overactivity 07/15/2012   OSA on CPAP 03/02/2012   Insomnia secondary to anxiety 02/29/2012   Sciatica 09/05/2011   Cervical stenosis of spinal canal 06/14/2011   Restless legs syndrome 03/13/2011   Type II diabetes mellitus with neurological manifestations (HCC) 03/12/2011   Hearing loss, right 03/12/2011    Orientation RESPIRATION BLADDER Height & Weight     Self, Time, Situation, Place  Normal Incontinent Weight:   Height:     BEHAVIORAL SYMPTOMS/MOOD NEUROLOGICAL BOWEL NUTRITION STATUS      Continent Diet  AMBULATORY STATUS COMMUNICATION OF NEEDS Skin   Limited Assist Verbally Normal (Dry and Intact)                       Personal Care Assistance Level of Assistance  Bathing, Feeding, Dressing Bathing Assistance: Limited assistance Feeding assistance: Limited assistance Dressing Assistance: Limited assistance     Functional Limitations Info  Sight, Hearing, Speech Sight Info: Adequate Hearing Info: Adequate Speech Info: Adequate (Hyperverbal difficult to redirect sometimes.)    SPECIAL CARE FACTORS FREQUENCY  PT (By licensed PT), OT (By licensed OT)     PT Frequency: 5x OT Frequency: 5x            Contractures Contractures Info: Not present    Additional Factors Info  Code Status, Allergies Code Status  Info: FULL Allergies Info: Amoxicillin ; Clarithromycin ; Demeclocycline; Erythromycin; Flagyl; Glucophage; Tetracyclines; Diovan; Sulfa  Antibiotic; Xanax           Current Medications (02/02/2024):  This is the current hospital active medication list Current Facility-Administered Medications  Medication Dose Route Frequency Provider Last Rate Last Admin   acetaminophen  (TYLENOL ) tablet 650 mg  650 mg Oral Q6H PRN Paudel, Keshab, MD   650 mg at 02/01/24 2017   Or   acetaminophen  (TYLENOL ) suppository 650 mg  650 mg Rectal Q6H PRN Roann Gouty, MD       allopurinol  (ZYLOPRIM ) tablet 100 mg  100 mg Oral Daily Paudel, Keshab, MD   100 mg at 02/02/24 0841   apixaban  (ELIQUIS ) tablet 5 mg  5 mg Oral BID Paudel, Gouty, MD   5 mg at 02/02/24 0841   atorvastatin  (LIPITOR ) tablet 80 mg  80 mg Oral Daily Paudel, Gouty, MD   80 mg at 02/02/24 0841   cephALEXin  (KEFLEX ) capsule 500 mg  500 mg Oral Q6H Patel, Sona, MD   500 mg at 02/02/24 1310   colchicine  tablet 0.6 mg  0.6 mg Oral BID Paudel, Keshab, MD   0.6 mg at 02/02/24 0841   DULoxetine  (CYMBALTA ) DR capsule 60 mg  60 mg Oral Daily Patel, Sona, MD       ezetimibe  (ZETIA ) tablet 10 mg  10  mg Oral Daily Patel, Sona, MD   10 mg at 02/02/24 9157   Finerenone  TABS 10 mg  10 mg Oral q AM Patel, Sona, MD       insulin  aspart (novoLOG ) injection 0-15 Units  0-15 Units Subcutaneous TID WC Patel, Sona, MD   8 Units at 02/02/24 1308   insulin  aspart (novoLOG ) injection 0-5 Units  0-5 Units Subcutaneous QHS Paudel, Keshab, MD   5 Units at 02/01/24 2221   insulin  glargine-yfgn (SEMGLEE ) injection 25 Units  25 Units Subcutaneous QHS Patel, Sona, MD       lipase/protease/amylase (CREON ) capsule 72,000 Units  72,000 Units Oral TID AC Patel, Sona, MD   72,000 Units at 02/02/24 1310   And   lipase/protease/amylase (CREON ) capsule 36,000 Units  36,000 Units Oral With snacks Patel, Sona, MD   36,000 Units at 02/01/24 2220   loratadine  (CLARITIN ) tablet 10 mg  10  mg Oral Daily Patel, Sona, MD   10 mg at 02/02/24 9158   ondansetron  (ZOFRAN ) tablet 4 mg  4 mg Oral Q6H PRN Paudel, Keshab, MD       Or   ondansetron  (ZOFRAN ) injection 4 mg  4 mg Intravenous Q6H PRN Roann Gouty, MD       pantoprazole  (PROTONIX ) EC tablet 40 mg  40 mg Oral BID Chappell, Alex B, RPH   40 mg at 02/02/24 0842   [START ON 02/03/2024] predniSONE  (DELTASONE ) tablet 20 mg  20 mg Oral Q breakfast Patel, Sona, MD       pregabalin  (LYRICA ) capsule 50 mg  50 mg Oral BID Patel, Sona, MD   50 mg at 02/02/24 9157   torsemide  (DEMADEX ) tablet 20 mg  20 mg Oral Daily Patel, Sona, MD   20 mg at 02/02/24 0841   traMADol  (ULTRAM ) tablet 50 mg  50 mg Oral Q6H PRN Patel, Sona, MD   50 mg at 02/01/24 2016     Discharge Medications: Please see discharge summary for a list of discharge medications.  Relevant Imaging Results:  Relevant Lab Results:   Additional Information [id number]  [place] L Shady Padron, LCSW

## 2024-02-02 NOTE — TOC Progression Note (Signed)
 Transition of Care [facility]) - Progression Note    Patient Details  Name: Denise Sharp MRN: [id number] Date of Birth: Feb 20, 1946  Transition of Care [facility]) CM/SW Contact  [place] L Voshon Pet[place], [place] Phone Number: 02/02/2024, 2:10 PM  Clinical Narrative:     FL2 completed and signed. Patient advised RN and Dr. Tobie of her choices. CSW sent bed searches to [facility], [facility] and [facility].        Expected Discharge Plan and Services                                               Social Determinants of Health (SDOH) Interventions SDOH Screenings   Food Insecurity: No Food Insecurity (02/01/2024)  Housing: Low Risk  (02/01/2024)  Transportation Needs: No Transportation Needs (02/01/2024)  Utilities: Not At Risk (02/01/2024)  Depression (PHQ2-9): High Risk (01/09/2024)  Financial Resource Strain: Low Risk  (09/15/2021)  Physical Activity: Insufficiently Active (11/15/2020)  Social Connections: Unknown (02/01/2024)  Stress: Stress Concern Present (08/03/2020)  Tobacco Use: Low Risk  (01/31/2024)    Readmission Risk Interventions     No data to display

## 2024-02-02 NOTE — Progress Notes (Addendum)
 Date and time results received: 02/01/2024 2056   Test: POCT Glucose Critical Value: 423  Name of Provider Notified: Erminio Cone, NP  Orders Received? Or Actions Taken?: STAT Venous Glucose Lab Order placed

## 2024-02-02 NOTE — Evaluation (Signed)
 Physical Therapy Evaluation Patient Details Name: Denise Sharp MRN: [id number] DOB: Sep 13, 1945 Today's Date: 02/02/2024  History of Present Illness  Denise Sharp is a pleasant 78 y.o. female with medical history significant for HFpEF, CAD, atrial fibrillation, type II DM, CKD, morbid obesity who presented with right hand pain and swelling of the left ankle for the last 2 weeks, worsening over the last several days and associated with redness.  She stated that pain is 7/10, continuous, worsening with the movement and better with the pain medications.  The swelling is the worst over the distal part of her right hand knuckle mostly 2nd and 3rd digits.  Clinical Impression  Patient resting in bed upon arrival to room; daughter and grand-daughter visiting at bedside (left during session).  Patient alert and oriented, follows commands and agreeable to participation with session; largely hyperverbal, difficulty redirecting at times.  Endorses chronic pain/soreness to bilat LEs (FACES 4-6/10) with notable edema bilat LEs (L > R). Reports baseline sensory deficit bilat knees distally with complete sensory loss distally.   Currently requires cga/min assist for sit/stand, standing balance, basic transfers and short-distance gait (8') with RW, L boot and R tennis shoe donned.   Demonstrates partially reciprocal stepping pattern with fair step height/length; broad BOS, but no buckling or LOB.  Difficulty with R UE grasp on RW (due to pain), but does allow R palm to rest on surface of RW for support as needed. Generally impulsive and unsteady with all mobility tasks; recommend consistent +1 assist for all mobility at this time. Would benefit from skilled PT to address above deficits and promote optimal return to PLOF.; recommend post-acute PT follow up as indicated by interdisciplinary care team.           If plan is discharge home, recommend the following: A little help with walking and/or transfers;A  little help with bathing/dressing/bathroom   Can travel by private vehicle   Yes    Equipment Recommendations Rolling walker (2 wheels)  Recommendations for Other Services       Functional Status Assessment Patient has had a recent decline in their functional status and demonstrates the ability to make significant improvements in function in a reasonable and predictable amount of time.     Precautions / Restrictions Precautions Precautions: Fall Restrictions Weight Bearing Restrictions Per Provider Order: Yes RUE Weight Bearing Per Provider Order: Weight bearing as tolerated RLE Weight Bearing Per Provider Order: Weight bearing as tolerated Other Position/Activity Restrictions: L LE boot with mobility/WBing      Mobility  Bed Mobility Overal bed mobility: Needs Assistance Bed Mobility: Supine to Sit     Supine to sit: Supervision          Transfers Overall transfer level: Needs assistance Equipment used: Rolling walker (2 wheels) Transfers: Sit to/from Stand, Bed to chair/wheelchair/BSC Sit to Stand: Contact guard assist, Min assist Stand pivot transfers: Contact guard assist, Min assist         General transfer comment: heavy use of momentum for anterior weight translation with movement transitions; definite use of UEs required; generally impulsive and brisk in all movement tasks    Ambulation/Gait Ambulation/Gait assistance: Contact guard assist, Min assist Gait Distance (Feet): 8 Feet Assistive device: Rolling walker (2 wheels)         General Gait Details: partially reciprocal stepping pattern with fair step height/length; broad BOS, but no buckling or LOB.  Difficulty with R UE grasp on RW (due to pain), but does allow R palm  to rest on surface of RW for support as needed  Stairs            Wheelchair Mobility     Tilt Bed    Modified Rankin (Stroke Patients Only)       Balance Overall balance assessment: Needs  assistance Sitting-balance support: No upper extremity supported, Feet supported Sitting balance-Leahy Scale: Normal     Standing balance support: Bilateral upper extremity supported Standing balance-Leahy Scale: Fair                               Pertinent Vitals/Pain Pain Assessment Pain Assessment: Faces Faces Pain Scale: Hurts even more Pain Location: R UE Pain Descriptors / Indicators: Aching, Burning Pain Intervention(s): Limited activity within patient's tolerance, Monitored during session, Repositioned    Home Living Family/patient expects to be discharged to:: Private residence Living Arrangements: Spouse/significant other Available Help at Discharge: Family;Available 24 hours/day Type of Home: House Home Access: Ramped entrance  Home not WC accessible (doorways and hallway narrow); would be bound to single room if WC level.  Strong preference to avoid BSC set up if possible due to chronic bowel issues/incontinence.     Home Layout: One level Home Equipment: Agricultural consultant (2 wheels);Electric scooter;Shower seat;Cane - single point;Rollator (4 wheels);Shower seat - built in;Grab bars - toilet;Grab bars - tub/shower      Prior Function Prior Level of Function : Needs assist             Mobility Comments: At baseline, ambulating limited household distances (to bathroom and back) with RW.  Since recent decline (due to progressive pain in R UE, L LE), using office chair to roll around house, uses cane for short distances due to pain in hand.  Does endorse fall history, difficulty quantifying ADLs Comments: Husband assists with ADLs/iADLs as able     Extremity/Trunk Assessment   Upper Extremity Assessment Upper Extremity Assessment: Generalized weakness (patient guarding R UE very closely, hesitant to use due to pain)    Lower Extremity Assessment Lower Extremity Assessment: Generalized weakness (chronic charcot deformity to bilat feet (L > R) with  significant sensory deficit knees distally.  Bilat hips/knees grossly at least 4/5, bilat ankles 3/5)       Communication   Communication Communication: No apparent difficulties    Cognition Arousal: Alert Behavior During Therapy: WFL for tasks assessed/performed                           PT - Cognition Comments: Hyperverbal and difficult to redirect in session Following commands: Intact       Cueing Cueing Techniques: Verbal cues     General Comments      Exercises Other Exercises Other Exercises: Toilet transfer, SPT without assist device, min assist; does require UE support throughout transfer.  Dep assist for hygiene Other Exercises: Sit/stand from bed height, from [facility], cga/min assist with and without RW; minimal change in level of assist with surface change or with/without assist device   Assessment/Plan    PT Assessment Patient needs continued PT services  PT Problem List Decreased strength;Decreased range of motion;Decreased activity tolerance;Decreased balance;Decreased mobility;Decreased coordination;Decreased knowledge of precautions;Decreased safety awareness;Obesity;Pain       PT Treatment Interventions DME instruction;Gait training;Functional mobility training;Therapeutic activities;Therapeutic exercise;Balance training;Patient/family education    PT Goals (Current goals can be found in the Care Plan section)  Acute Rehab PT Goals Patient  Stated Goal: to get my strength back and be able to walk to the bathroom PT Goal Formulation: With patient Time For Goal Achievement: 02/16/24 Potential to Achieve Goals: Good    Frequency Min 2X/week     Co-evaluation               AM-PAC PT 6 Clicks Mobility  Outcome Measure Help needed turning from your back to your side while in a flat bed without using bedrails?: None Help needed moving from lying on your back to sitting on the side of a flat bed without using bedrails?: None Help needed  moving to and from a bed to a chair (including a wheelchair)?: A Little Help needed standing up from a chair using your arms (e.g., wheelchair or bedside chair)?: A Little Help needed to walk in hospital room?: A Little Help needed climbing 3-5 steps with a railing? : A Lot 6 Click Score: 19    End of Session Equipment Utilized During Treatment: Gait belt Activity Tolerance: Patient tolerated treatment well Patient left: in chair;with call bell/phone within reach;with chair alarm set Nurse Communication: Mobility status PT Visit Diagnosis: Muscle weakness (generalized) (M62.81);Difficulty in walking, not elsewhere classified (R26.2)    Time: 8973-8879 PT Time Calculation (min) (ACUTE ONLY): 54 min   Charges:   PT Evaluation $PT Eval Moderate Complexity: 1 Mod PT Treatments $Therapeutic Activity: 23-37 mins PT General Charges $$ ACUTE PT VISIT: 1 Visit         [place] Sitzmann H. Delores, PT, DPT, NCS 02/02/24, 1:24 PM [phone]

## 2024-02-03 DIAGNOSIS — M109 Gout, unspecified: Secondary | ICD-10-CM | POA: Diagnosis not present

## 2024-02-03 DIAGNOSIS — F32A Depression, unspecified: Secondary | ICD-10-CM | POA: Diagnosis not present

## 2024-02-03 DIAGNOSIS — I2510 Atherosclerotic heart disease of native coronary artery without angina pectoris: Secondary | ICD-10-CM | POA: Diagnosis not present

## 2024-02-03 DIAGNOSIS — R051 Acute cough: Secondary | ICD-10-CM | POA: Diagnosis not present

## 2024-02-03 DIAGNOSIS — I959 Hypotension, unspecified: Secondary | ICD-10-CM | POA: Diagnosis not present

## 2024-02-03 DIAGNOSIS — E46 Unspecified protein-calorie malnutrition: Secondary | ICD-10-CM | POA: Diagnosis not present

## 2024-02-03 DIAGNOSIS — L0390 Cellulitis, unspecified: Secondary | ICD-10-CM | POA: Diagnosis not present

## 2024-02-03 DIAGNOSIS — E559 Vitamin D deficiency, unspecified: Secondary | ICD-10-CM | POA: Diagnosis not present

## 2024-02-03 DIAGNOSIS — B351 Tinea unguium: Secondary | ICD-10-CM | POA: Diagnosis not present

## 2024-02-03 DIAGNOSIS — M6259 Muscle wasting and atrophy, not elsewhere classified, multiple sites: Secondary | ICD-10-CM | POA: Diagnosis not present

## 2024-02-03 DIAGNOSIS — E785 Hyperlipidemia, unspecified: Secondary | ICD-10-CM | POA: Diagnosis not present

## 2024-02-03 DIAGNOSIS — E11610 Type 2 diabetes mellitus with diabetic neuropathic arthropathy: Secondary | ICD-10-CM | POA: Diagnosis not present

## 2024-02-03 DIAGNOSIS — L03011 Cellulitis of right finger: Secondary | ICD-10-CM | POA: Diagnosis not present

## 2024-02-03 DIAGNOSIS — E1165 Type 2 diabetes mellitus with hyperglycemia: Secondary | ICD-10-CM | POA: Diagnosis not present

## 2024-02-03 DIAGNOSIS — I69921 Dysphasia following unspecified cerebrovascular disease: Secondary | ICD-10-CM | POA: Diagnosis not present

## 2024-02-03 DIAGNOSIS — J3089 Other allergic rhinitis: Secondary | ICD-10-CM | POA: Diagnosis not present

## 2024-02-03 DIAGNOSIS — G629 Polyneuropathy, unspecified: Secondary | ICD-10-CM | POA: Diagnosis not present

## 2024-02-03 DIAGNOSIS — R0989 Other specified symptoms and signs involving the circulatory and respiratory systems: Secondary | ICD-10-CM | POA: Diagnosis not present

## 2024-02-03 DIAGNOSIS — R2689 Other abnormalities of gait and mobility: Secondary | ICD-10-CM | POA: Diagnosis not present

## 2024-02-03 DIAGNOSIS — M79676 Pain in unspecified toe(s): Secondary | ICD-10-CM | POA: Diagnosis not present

## 2024-02-03 DIAGNOSIS — R059 Cough, unspecified: Secondary | ICD-10-CM | POA: Diagnosis not present

## 2024-02-03 DIAGNOSIS — F419 Anxiety disorder, unspecified: Secondary | ICD-10-CM | POA: Diagnosis not present

## 2024-02-03 DIAGNOSIS — L03113 Cellulitis of right upper limb: Principal | ICD-10-CM

## 2024-02-03 DIAGNOSIS — M1009 Idiopathic gout, multiple sites: Secondary | ICD-10-CM | POA: Diagnosis not present

## 2024-02-03 DIAGNOSIS — J189 Pneumonia, unspecified organism: Secondary | ICD-10-CM | POA: Diagnosis not present

## 2024-02-03 DIAGNOSIS — N3946 Mixed incontinence: Secondary | ICD-10-CM | POA: Diagnosis not present

## 2024-02-03 DIAGNOSIS — G4733 Obstructive sleep apnea (adult) (pediatric): Secondary | ICD-10-CM | POA: Diagnosis not present

## 2024-02-03 DIAGNOSIS — E1149 Type 2 diabetes mellitus with other diabetic neurological complication: Secondary | ICD-10-CM | POA: Diagnosis not present

## 2024-02-03 DIAGNOSIS — K219 Gastro-esophageal reflux disease without esophagitis: Secondary | ICD-10-CM | POA: Diagnosis not present

## 2024-02-03 DIAGNOSIS — E569 Vitamin deficiency, unspecified: Secondary | ICD-10-CM | POA: Diagnosis not present

## 2024-02-03 DIAGNOSIS — E876 Hypokalemia: Secondary | ICD-10-CM | POA: Diagnosis not present

## 2024-02-03 DIAGNOSIS — Z6841 Body Mass Index (BMI) 40.0 and over, adult: Secondary | ICD-10-CM | POA: Diagnosis not present

## 2024-02-03 DIAGNOSIS — I4891 Unspecified atrial fibrillation: Secondary | ICD-10-CM | POA: Diagnosis not present

## 2024-02-03 DIAGNOSIS — E119 Type 2 diabetes mellitus without complications: Secondary | ICD-10-CM | POA: Diagnosis not present

## 2024-02-03 DIAGNOSIS — G4700 Insomnia, unspecified: Secondary | ICD-10-CM | POA: Diagnosis not present

## 2024-02-03 DIAGNOSIS — Z741 Need for assistance with personal care: Secondary | ICD-10-CM | POA: Diagnosis not present

## 2024-02-03 DIAGNOSIS — M6281 Muscle weakness (generalized): Secondary | ICD-10-CM | POA: Diagnosis not present

## 2024-02-03 DIAGNOSIS — H04123 Dry eye syndrome of bilateral lacrimal glands: Secondary | ICD-10-CM | POA: Diagnosis not present

## 2024-02-03 DIAGNOSIS — M14672 Charcot's joint, left ankle and foot: Secondary | ICD-10-CM | POA: Diagnosis not present

## 2024-02-03 DIAGNOSIS — E162 Hypoglycemia, unspecified: Secondary | ICD-10-CM | POA: Diagnosis not present

## 2024-02-03 LAB — BASIC METABOLIC PANEL WITH GFR
Anion gap: 13 (ref 5–15)
BUN: 44 mg/dL — ABNORMAL HIGH (ref 8–23)
CO2: 25 mmol/L (ref 22–32)
Calcium: 8.6 mg/dL — ABNORMAL LOW (ref 8.9–10.3)
Chloride: 99 mmol/L (ref 98–111)
Creatinine, Ser: 1.43 mg/dL — ABNORMAL HIGH (ref 0.44–1.00)
GFR, Estimated: 38 mL/min — ABNORMAL LOW (ref >60)
Glucose, Bld: 165 mg/dL — ABNORMAL HIGH (ref 70–99)
Potassium: 4.2 mmol/L (ref 3.5–5.1)
Sodium: 137 mmol/L (ref 135–145)

## 2024-02-03 LAB — GLUCOSE, CAPILLARY
Glucose-Capillary: 126 mg/dL — ABNORMAL HIGH (ref 70–99)
Glucose-Capillary: 159 mg/dL — ABNORMAL HIGH (ref 70–99)

## 2024-02-03 MED ORDER — PREDNISONE 20 MG PO TABS: 20.0000 mg | ORAL_TABLET | Freq: Every day | ORAL | 0 refills | Status: AC

## 2024-02-03 MED ORDER — INSULIN ASPART 100 UNIT/ML IJ SOLN
4.0000 [IU] | Freq: Three times a day (TID) | INTRAMUSCULAR | Status: DC
  Administered 2024-02-03: 4 [IU] via SUBCUTANEOUS
  Filled 2024-02-03: qty 1

## 2024-02-03 MED ORDER — TRESIBA FLEXTOUCH 100 UNIT/ML ~~LOC~~ SOPN: 25.0000 [IU] | PEN_INJECTOR | Freq: Every day | SUBCUTANEOUS | 3 refills | Status: DC

## 2024-02-03 MED ORDER — CEPHALEXIN 500 MG PO CAPS: 500.0000 mg | ORAL_CAPSULE | Freq: Four times a day (QID) | ORAL | 0 refills | Status: AC

## 2024-02-03 MED ORDER — TRAMADOL HCL 50 MG PO TABS: 50.0000 mg | ORAL_TABLET | Freq: Four times a day (QID) | ORAL | 0 refills | Status: DC | PRN

## 2024-02-03 NOTE — Plan of Care (Signed)
  Problem: Education: Goal: Ability to describe self-care measures that may prevent or decrease complications (Diabetes Survival Skills Education) will improve Outcome: Progressing   Problem: Skin Integrity: Goal: Risk for impaired skin integrity will decrease Outcome: Progressing   Problem: Activity: Goal: Risk for activity intolerance will decrease Outcome: Progressing

## 2024-02-03 NOTE — Progress Notes (Signed)
 Report called to [facility] facility. Husband will be here around 4:30 to pick up patient. Jemiah Ellen[place] S Shaniqua Guillot

## 2024-02-03 NOTE — Discharge Instructions (Signed)
 Resume your f/u with [facility]  foot Dr Verta as before Use your left ankle boot as instructed

## 2024-02-03 NOTE — TOC Transition Note (Signed)
 Transition of Care [facility]) - Discharge Note   Patient Details  Name: SARHA BARTELT MRN: [id number] Date of Birth: Mar 28, 1946  [facility]) CM/SW Contact:  Corean ONEIDA Haddock, RN Phone Number: 02/03/2024, 3:06 PM   Clinical Narrative:      Patient first preferance for SNF is [facility] . [facility] confirmed patient can admit today under [facility] waiver today  Email sent to [facility] post acute care to notify  Patient will DC to: [facility] Anticipated DC date: 02/03/24  Family notified: Patient to notify husband  Transport by: husband  Per MD patient ready for DC to . RN, patient, , and facility notified of DC. Discharge Summary sent to facility. RN given number for report. DC packet on chart.   TOC signing off.        Patient Goals and CMS Choice            Discharge Placement                       Discharge Plan and Services Additional resources added to the After Visit Summary for                                       Social Drivers of Health (SDOH) Interventions SDOH Screenings   Food Insecurity: No Food Insecurity (02/01/2024)  Housing: Low Risk  (02/01/2024)  Transportation Needs: No Transportation Needs (02/01/2024)  Utilities: Not At Risk (02/01/2024)  Depression (PHQ2-9): High Risk (01/09/2024)  Financial Resource Strain: Low Risk  (09/15/2021)  Physical Activity: Insufficiently Active (11/15/2020)  Social Connections: Unknown (02/01/2024)  Stress: Stress Concern Present (08/03/2020)  Tobacco Use: Low Risk  (01/31/2024)     Readmission Risk Interventions     No data to display

## 2024-02-03 NOTE — Progress Notes (Signed)
 Attempted to call [facility] facility tpo let them know pt was on the way with her husband driving her. Pt discharged with envelope that included her d/c paperwork/AVS and printed prescriptions. Denise Sharp

## 2024-02-03 NOTE — Plan of Care (Signed)
  Problem: Fluid Volume: Goal: Ability to maintain a balanced intake and output will improve Outcome: Progressing   Problem: Health Behavior/Discharge Planning: Goal: Ability to identify and utilize available resources and services will improve Outcome: Progressing   Problem: Nutritional: Goal: Maintenance of adequate nutrition will improve Outcome: Progressing   Problem: Education: Goal: Knowledge of General Education information will improve Description: Including pain rating scale, medication(s)/side effects and non-pharmacologic comfort measures Outcome: Progressing   Problem: Education: Goal: Ability to describe self-care measures that may prevent or decrease complications (Diabetes Survival Skills Education) will improve Outcome: Not Progressing   Problem: Coping: Goal: Ability to adjust to condition or change in health will improve Outcome: Not Progressing   Problem: Health Behavior/Discharge Planning: Goal: Ability to manage health-related needs will improve Outcome: Not Progressing   Problem: Nutritional: Goal: Progress toward achieving an optimal weight will improve Outcome: Not Progressing   Problem: Skin Integrity: Goal: Risk for impaired skin integrity will decrease Outcome: Not Progressing   Problem: Tissue Perfusion: Goal: Adequacy of tissue perfusion will improve Outcome: Not Progressing   Problem: Health Behavior/Discharge Planning: Goal: Ability to manage health-related needs will improve Outcome: Not Progressing   Problem: Clinical Measurements: Goal: Cardiovascular complication will be avoided Outcome: Not Progressing

## 2024-02-03 NOTE — Discharge Summary (Signed)
 Physician Discharge Summary   Patient: Denise Sharp MRN: [id number] DOB: October 08, 1945  Admit date:     01/31/2024  Discharge date: 02/03/24  Discharge Physician: Leita Blanch   PCP: Marylynn Verneita CROME, MD   Recommendations at discharge:   follow-up PCP in 1 to 2 resume your care with [facility] as before [facility] orthopedic Dr. Kayla Pinal in 1 to 2 weeks  Discharge Diagnoses: Principal Problem:   Cellulitis Active Problems:   Type II diabetes mellitus with neurological manifestations (HCC)   OSA on CPAP   Gout of left ankle   Charcot joint of left foot  Denise Sharp is a pleasant 78 y.o. female with medical history significant for HFpEF, CAD, atrial fibrillation, type II DM, CKD, morbid obesity who presented with right hand pain and swelling of the left ankle for the last 2 weeks, worsening over the last several days and associated with redness.  She stated that pain is 7/10, continuous, worsening with the movement and better with the pain medications.  The swelling is the worst over the distal part of her right hand knuckle mostly 2nd and 3rd digits.  She initially thought it might be gout.  She has seen Ortho today and told her to come to the emergency room with the concern for right hand cellulitis.    Right hand cellulitis with severe DJD -- improving. Redness resolved - ED patient received cefazolin  - blood cultures negative so far -oral Keflex  for 7 to 10 days - Start on pain medications Tylenol  and tramadol  -- PT OT to see patient--recommends rehab   Left ankle chronic swelling  Severe Charcot arthropathy as noted on CT left ankle -- Patient was taking colchicine  in the past which will be continued - Continue allopurinol   - on steroid for couple days  - Pain medications  as mentioned above -seen by orthopedic Dr. Pinal no other recommendations then pain meds, PT and complete antibiotic course. He recommended patient use left lfoot boot while ambulation.   Diabetes  type 2, uncontrolled, hypoglycemia in the setting of steroids -will resume long acting insulin  along with sliding scale insulin   --adjust insulin  dosing according to sugars    OSA -Continue CPAP   Chronic atrial fibrillation of chronic anticoagulation -- resume home med   Urinary incontinence, chronic   POC for discharge planning to rehab today   Procedures: Family communication : none today Consults : orthopedic CODE STATUS: full DVT Prophylaxis : eliquis       Pain control - [place]  Controlled Substance Reporting System database was reviewed. and patient was instructed, not to drive, operate heavy machinery, perform activities at heights, swimming or participation in water  activities or provide baby-sitting services while on Pain, Sleep and Anxiety Medications; until their outpatient Physician has advised to do so again. Also recommended to not to take more than prescribed Pain, Sleep and Anxiety Medications.  Disposition: Skilled nursing facility Diet recommendation:  Discharge Diet Orders (From admission, onward)     Start     Ordered   02/03/24 0000  Diet - low sodium heart healthy        02/03/24 1446           Cardiac and Carb modified diet DISCHARGE MEDICATION: Allergies as of 02/03/2024       Reactions   Amoxicillin  Diarrhea   amoxicillin    Clarithromycin  Nausea And Vomiting, Other (See Comments)   Severe irritable bowel clarithromycin    Demeclocycline Hives   Erythromycin Nausea And Vomiting, Other (  See Comments)   Severe irritable bowel   Flagyl [metronidazole] Nausea And Vomiting, Other (See Comments)   Severe irritable bowel   Glucophage [metformin Hcl] Nausea And Vomiting, Other (See Comments)   Sick I won't take anything that has metformin in it   Tetracyclines & Related Hives, Rash   Potassium Chloride  Other (See Comments)   Cannot swallow potssium pills, able to tolerate liquid potassium   Tetracycline Other (See Comments)   Diovan  [valsartan] Nausea Only       Sulfa  Antibiotics Other (See Comments), Rash   As child   Xanax [alprazolam] Other (See Comments)   Hyperactivity         Medication List     STOP taking these medications    butalbital -acetaminophen -caffeine  50-325-40 MG tablet Commonly known as: FIORICET        TAKE these medications    allopurinol  100 MG tablet Commonly known as: ZYLOPRIM  Take 1 tablet (100 mg total) by mouth daily.   apixaban  5 MG Tabs tablet Commonly known as: ELIQUIS  Take 1 tablet (5 mg total) by mouth 2 (two) times daily.   atorvastatin  80 MG tablet Commonly known as: LIPITOR  Take 1 tablet (80 mg total) by mouth daily.   budesonide -formoterol  160-4.5 MCG/ACT inhaler Commonly known as: SYMBICORT  Inhale 2 puffs into the lungs 2 (two) times daily.   cephALEXin  500 MG capsule Commonly known as: KEFLEX  Take 1 capsule (500 mg total) by mouth every 6 (six) hours for 5 days.   clotrimazole -betamethasone  cream Commonly known as: LOTRISONE  Apply 1 application topically 2 (two) times daily as needed (irritation).   colchicine  0.6 MG tablet Take 1 tablet (0.6 mg total) by mouth 2 (two) times daily.   cyanocobalamin  1000 MCG/ML injection Commonly known as: VITAMIN B12 Inject 1 mL (1,000 mcg total) into the muscle every 30 (thirty) days. Inject 1 ml (1000 mcg ) IM weekly x 4,  Then monthly thereafter   dapagliflozin  propanediol 10 MG Tabs tablet Commonly known as: FARXIGA  Take 1 tablet (10 mg total) by mouth daily.   dicyclomine  20 MG tablet Commonly known as: BENTYL  Take 1 tablet (20 mg total) by mouth 3 (three) times daily before meals.   Droplet Pen Needles 31G X 5 MM Misc Generic drug: Insulin  Pen Needle USE ONE NEEDLE SUBCUTANEOUSLY AS DIRECTED (REMOVE AND DISCARD NEEDLE IN SHARPS CONTAINER IMMEDIATELY AFTER USE)   DULoxetine  60 MG capsule Commonly known as: Cymbalta  Take 1 capsule (60 mg total) by mouth daily.   ezetimibe  10 MG tablet Commonly known  as: ZETIA  Take 1 tablet (10 mg total) by mouth daily.   fexofenadine 180 MG tablet Commonly known as: ALLEGRA Take 180 mg by mouth as needed for allergies or rhinitis.   fluticasone  50 MCG/ACT nasal spray Commonly known as: FLONASE  Place 2 sprays into both nostrils daily.   FreeStyle Libre 3 Plus Sensor Misc Change sensor every 15 days.   Gvoke HypoPen  2-Pack 0.5 MG/0.1ML Soaj Generic drug: Glucagon  Inject 1 auto injector subcutaneously as needed for severe hypoglycemia. May repeat x 1 after 15 minutes if needed.   Kerendia  10 MG Tabs Generic drug: Finerenone  Take 1 tablet (10 mg total) by mouth in the morning.   lidocaine  5 % Commonly known as: LIDODERM  PLACE 1 PATCH ONTO THE SKIN DAILY. REMOVE & DISCARD PATCH WITHIN 12 HOURS OR AS DIRECTED BY MD   lipase/protease/amylase 63999 UNITS Cpep capsule Commonly known as: Creon  Take 2 capsules (72,000 Units total) by mouth 3 (three) times daily before meals AND  1 capsule (36,000 Units total) with snacks.   loperamide  2 MG capsule Commonly known as: IMODIUM  Take 4-6 mg by mouth 3 (three) times daily before meals.   nitroGLYCERIN  0.4 MG SL tablet Commonly known as: NITROSTAT  Place 1 tablet (0.4 mg total) under the tongue every 5 (five) minutes as needed for chest pain.   NovoLOG  FlexPen 100 UNIT/ML FlexPen Generic drug: insulin  aspart Inject 8-14 Units into the skin 3 (three) times daily with meals.   nystatin  powder Commonly known as: MYCOSTATIN /NYSTOP  Apply 1 g topically 4 (four) times daily as needed (rash).   ondansetron  4 MG disintegrating tablet Commonly known as: ZOFRAN -ODT Take 1 tablet (4 mg total) by mouth every 8 (eight) hours as needed for nausea or vomiting.   OneTouch Delica Lancets 33G Misc Used to check blood sugar two times a day.   OneTouch Verio test strip Generic drug: glucose blood Use to check blood sugar twice daily.   OVER THE COUNTER MEDICATION Apply 1 application  topically daily as needed  (pain). [place] Gel topical pain reliever   pantoprazole  40 MG tablet Commonly known as: PROTONIX  Take 1 tablet (40 mg total) by mouth daily.   potassium chloride  20 MEQ/15ML (10%) Soln Take 15 mLs (20 mEq total) by mouth daily.   predniSONE  20 MG tablet Commonly known as: DELTASONE  Take 1 tablet (20 mg total) by mouth daily with breakfast for 3 days. Start taking on: February 04, 2024   pregabalin  50 MG capsule Commonly known as: LYRICA  Take 1 capsule (50 mg total) by mouth 2 (two) times daily.   Premarin  vaginal cream Generic drug: conjugated estrogens  Apply one pea-sized amount around the opening of the urethra daily for 2 weeks, then 3 times weekly moving forward.   SYSTANE OP Place 1 drop into both eyes 4 (four) times daily as needed (dry eyes).   tiZANidine  4 MG tablet Commonly known as: ZANAFLEX  Take 1 tablet (4 mg total) by mouth in the morning and at bedtime.   torsemide  20 MG tablet Commonly known as: DEMADEX  Take 1 tablet (20 mg total) by mouth daily.   traMADol  50 MG tablet Commonly known as: ULTRAM  Take 1 tablet (50 mg total) by mouth every 6 (six) hours as needed for severe pain (pain score 7-10) or moderate pain (pain score 4-6).   Tresiba  FlexTouch 100 UNIT/ML FlexTouch Pen Generic drug: insulin  degludec Inject 25 Units into the skin daily. What changed: how much to take   Vitamin D3 250 MCG (10000 UT) capsule Take 10,000 Units by mouth daily.        Follow-up Information     Marylynn Verneita CROME, MD. Schedule an appointment as soon as possible for a visit in 1 week(s).   Specialty: Internal Medicine Contact information: [address] [place] [address] [phone]         Cleotilde Barrio, MD. Schedule an appointment as soon as possible for a visit in 2 week(s).   Specialty: Orthopedic Surgery Contact information: [address] [place] [address] [phone]                    Condition at discharge:  fair  The results of significant diagnostics from this hospitalization (including imaging, microbiology, ancillary and laboratory) are listed below for reference.   Imaging Studies: CT FOOT LEFT WO CONTRAST Result Date: 01/29/2024 CLINICAL DATA:  Bone mass or bone pain, foot, benign features on xray Status post fall. Acquired pes planovalgus of left foot. Charcot  foot due to diabetes mellitus. EXAM: CT OF THE LEFT FOOT WITHOUT CONTRAST TECHNIQUE: Multidetector CT imaging of the left foot was performed according to the standard protocol. Multiplanar CT image reconstructions were also generated. RADIATION DOSE REDUCTION: This exam was performed according to the departmental dose-optimization program which includes automated exposure control, adjustment of the mA and/or kV according to patient size and/or use of iterative reconstruction technique. COMPARISON:  Radiographs 11/27/2023 and 10/14/2017. MRI of the left forefoot 05/07/2008. FINDINGS: Bones/Joint/Cartilage No definite acute osseous findings. Stable postsurgical changes from remote resection of the distal 5th metatarsal. The 5th toe remains intact. Stable mild clawtoe deformities of the 2nd through 5th digits. Mild degenerative changes at the 1st metatarsophalangeal joint and advanced degenerative changes at the 3rd metatarsophalangeal joint, similar to recent radiographs. Severe chronic Charcot changes are again noted throughout the midfoot with chronic fragmentation and destruction of the cuneiform bones, cuboid, navicular, talus and calcaneus. Alignment at the Lisfranc joint is relatively maintained, although there is chronic dorsal and medial dislocation at the transverse tarsal joint. Relatively mild tibiotalar and subtalar degenerative changes. There is diffuse synovial thickening throughout the hindfoot and midfoot. Ligaments Suboptimally assessed by CT. Muscles and Tendons Intact Achilles tendon. Possible chronic impingement on the medial flexor  and peroneal tendons by the Charcot changes. No gross tendon rupture identified by CT. Generalized muscular atrophy. Soft tissues Generalized soft tissue swelling with diffuse synovial thickening throughout the hindfoot and midfoot. As above, diffuse osseous fragmentation with heterotopic ossification. No organized fluid collection, foreign body or soft tissue emphysema identified. There are possible pressure lesions plantar to the 1st metatarsophalangeal joint and remnant of the 5th metatarsal, without obvious overlying soft tissue ulceration. IMPRESSION: 1. No definite acute osseous findings. 2. Severe chronic Charcot changes throughout the midfoot with chronic fragmentation and destruction of the cuneiform bones, cuboid, navicular, talus and calcaneus. 3. 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 4. Generalized soft tissue swelling with diffuse synovial thickening throughout the hindfoot and midfoot. No organized fluid collection, foreign body or soft tissue emphysema identified. 5. Stable postsurgical changes from remote resection of the distal 5th metatarsal. 6. Stable degenerative changes at the 1st and 3rd metatarsophalangeal joints. Electronically Signed   By: Elsie Perone M.D.   On: 01/29/2024 12:37    Microbiology: Results for orders placed or performed during the hospital encounter of 01/31/24  Culture, blood (Routine X 2) w Reflex to ID Panel     Status: None (Preliminary result)   Collection Time: 01/31/24  8:20 PM   Specimen: BLOOD  Result Value Ref Range Status   Specimen Description BLOOD LEFT ASSIST CONTROL  Final   Special Requests   Final    BOTTLES DRAWN AEROBIC AND ANAEROBIC Blood Culture adequate volume   Culture   Final    NO GROWTH 3 DAYS Performed at [facility], [address], [place], [place] [address]    Report Status PENDING  Incomplete  Culture, blood (Routine X 2) w Reflex to ID Panel     Status: None (Preliminary result)    Collection Time: 01/31/24  8:35 PM   Specimen: BLOOD  Result Value Ref Range Status   Specimen Description BLOOD LEFT WRIST  Final   Special Requests   Final    BOTTLES DRAWN AEROBIC ONLY Blood Culture results may not be optimal due to an inadequate volume of blood received in culture bottles   Culture   Final    NO GROWTH 3 DAYS Performed  at [facility], [address], [place], [place] [address]    Report Status PENDING  Incomplete   *Note: Due to a large number of results and/or encounters for the requested time period, some results have not been displayed. A complete set of results can be found in Results Review.    Labs: CBC: Recent Labs  Lab 01/31/24 1544 02/01/24 0414  WBC 16.5* 10.5  NEUTROABS 9.9*  --   HGB 12.7 12.1  HCT 40.1 39.2  MCV 90.3 91.6  PLT 535* 461*   Basic Metabolic Panel: Recent Labs  Lab 01/31/24 1544 02/01/24 0414 02/01/24 2145 02/02/24 2012 02/03/24 1010  NA 138 135  --   --  137  K 4.4 5.2*  --   --  4.2  CL 102 101  --   --  99  CO2 23 21*  --   --  25  GLUCOSE 88 364* 386* 389* 165*  BUN 23 24*  --   --  44*  CREATININE 1.30* 1.34*  --   --  1.43*  CALCIUM  9.3 8.7*  --   --  8.6*   Liver Function Tests: Recent Labs  Lab 01/31/24 1544 02/01/24 0414  AST 30 21  ALT 21 18  ALKPHOS 167* 142*  BILITOT 0.7 0.7  PROT 8.2* 6.8  ALBUMIN  3.1* 2.6*   CBG: Recent Labs  Lab 02/02/24 1702 02/02/24 1937 02/02/24 2300 02/03/24 0855 02/03/24 1215  GLUCAP 371* 404* 227* 126* 159*    Discharge time spent: greater than 30 minutes.  Signed: Leita Blanch, MD [facility] 02/03/2024

## 2024-02-03 NOTE — Inpatient Diabetes Management (Signed)
 Inpatient Diabetes Program Recommendations  AACE/ADA: New Consensus Statement on Inpatient Glycemic Control (2015)  Target Ranges:  Prepandial:   less than 140 mg/dL      [facility] postprandial:   less than 180 mg/dL (1-2 hours)      Critically ill patients:  140 - 180 mg/dL   Lab Results  Component Value Date   GLUCAP 126 (H) 02/03/2024   HGBA1C 7.6 (A) 01/07/2024    Review of Glycemic Control  Latest Reference Range & Units 02/02/24 11:33 02/02/24 17:02 02/02/24 19:37 02/02/24 23:00 02/03/24 08:55  Glucose-Capillary 70 - 99 mg/dL 704 (H) 628 (H) 595 (H) 227 (H) 126 (H)   Diabetes history: DM 2 Outpatient Diabetes medications: FSL3, Farxiga  10 mg daily, Novolog  8-14 units tid with meals, Tresiba  15 units daily Current orders for Inpatient glycemic control:  Novolog  0-15 units tid with meals and HS Semglee  25 units q HS Prednisone  20 mg daily Inpatient Diabetes Program Recommendations:    Please consider adding Novolog  4 units tid with meals (hold if patient eats less than 50% or NPO).    Thanks,  Randall Bullocks, RN, BC-ADM Inpatient Diabetes Coordinator Pager [phone]  (8a-5p)

## 2024-02-03 NOTE — Progress Notes (Signed)
 [facility]  - [place] [facility]   PATIENT NAME: Denise Sharp    MR#:  [id number]  DATE OF BIRTH:  December 26, 1945  SUBJECTIVE:  no family at bedside during my evaluation. Sugar is somewhat better today. Patient trying to work with PT and uses bedside commode to help with mobility. No family at bedside  VITALS:  Blood pressure (!) 104/50, pulse 66, temperature 98.2 F (36.8 C), temperature source Oral, resp. rate 17, SpO2 99%.  PHYSICAL EXAMINATION:   GENERAL:  78 y.o.-year-old patient with no acute distress. Morbidly obese LUNGS: Normal breath sounds bilaterally, no wheezing CARDIOVASCULAR: S1, S2 normal. No murmur   ABDOMEN: Soft, nontender, nondistended. Bowel sounds present.  EXTREMITIES: upper and lower extremity DJD with more prominent in the right upper extremity and left foot/ankle-- improving   LABORATORY PANEL:  CBC Recent Labs  Lab 02/01/24 0414  WBC 10.5  HGB 12.1  HCT 39.2  PLT 461*    Chemistries  Recent Labs  Lab 02/01/24 0414 02/01/24 2145 02/03/24 1010  NA 135  --  137  K 5.2*  --  4.2  CL 101  --  99  CO2 21*  --  25  GLUCOSE 364*   < > 165*  BUN 24*  --  44*  CREATININE 1.34*  --  1.43*  CALCIUM  8.7*  --  8.6*  AST 21  --   --   ALT 18  --   --   ALKPHOS 142*  --   --   BILITOT 0.7  --   --    < > = values in this interval not displayed.    Assessment and Plan   Denise Sharp is a pleasant 78 y.o. female with medical history significant for HFpEF, CAD, atrial fibrillation, type II DM, CKD, morbid obesity who presented with right hand pain and swelling of the left ankle for the last 2 weeks, worsening over the last several days and associated with redness.  She stated that pain is 7/10, continuous, worsening with the movement and better with the pain medications.  The swelling is the worst over the distal part of her right hand knuckle mostly 2nd and 3rd digits.  She initially thought it might be gout.  She has seen  Ortho today and told her to come to the emergency room with the concern for right hand cellulitis.   Right hand cellulitis with severe DJD -- improving. Redness resolved - ED patient received cefazolin  - blood cultures negative so far -oral Keflex  for 7 to 10 days - Start on pain medications Tylenol  and tramadol  -- PT OT to see patient--recommends rehab   Left ankle chronic swelling  Severe Charcot arthropathy as noted on CT left ankle --- Patient was taking colchicine  in the past which will be continued -- Continue allopurinol   -  on steroid for couple days  - Pain medications  as mentioned above -seen by orthopedic Dr. Cleotilde no other recommendations then pain meds, PT and complete antibiotic course. He recommended patient use left leg boot while ambulation.   Diabetes type 2, uncontrolled, hypoglycemia in the setting of steroids -will resume long acting insulin  along with sliding scale insulin     OSA - Continue CPAP  Chronic atrial fibrillation of chronic anticoagulation -- resume home med  Urinary incontinence -- given patient obesity and unable to get to the bedside commode or bedpan will use purwick  POC for discharge planning to rehab  Procedures: Family communication : daughter  at bedside Consults : orthopedic CODE STATUS: full DVT Prophylaxis : eliquis  Level of care: Med-Surg Status is: Observation The patient remains OBS appropriate and will d/c before 2 midnights.    TOTAL TIME TAKING CARE OF THIS PATIENT: 45 minutes.  >50% time spent on counselling and coordination of care  Note: This dictation was prepared with Dragon dictation along with smaller phrase technology. Any transcriptional errors that result from this process are unintentional.  Leita Blanch M.D    [facility]   CC: Primary care physician; Marylynn Verneita CROME, MD

## 2024-02-04 DIAGNOSIS — M6281 Muscle weakness (generalized): Secondary | ICD-10-CM | POA: Diagnosis not present

## 2024-02-05 LAB — CULTURE, BLOOD (ROUTINE X 2)
Culture: NO GROWTH
Culture: NO GROWTH
Special Requests: ADEQUATE

## 2024-02-06 DIAGNOSIS — M14672 Charcot's joint, left ankle and foot: Secondary | ICD-10-CM | POA: Diagnosis not present

## 2024-02-06 DIAGNOSIS — M1009 Idiopathic gout, multiple sites: Secondary | ICD-10-CM | POA: Diagnosis not present

## 2024-02-06 DIAGNOSIS — L03011 Cellulitis of right finger: Secondary | ICD-10-CM | POA: Diagnosis not present

## 2024-02-06 DIAGNOSIS — I4891 Unspecified atrial fibrillation: Secondary | ICD-10-CM | POA: Diagnosis not present

## 2024-02-08 DIAGNOSIS — I959 Hypotension, unspecified: Secondary | ICD-10-CM | POA: Diagnosis not present

## 2024-02-10 ENCOUNTER — Telehealth: Payer: Self-pay

## 2024-02-10 ENCOUNTER — Ambulatory Visit (INDEPENDENT_AMBULATORY_CARE_PROVIDER_SITE_OTHER): Admitting: Podiatry

## 2024-02-10 DIAGNOSIS — K219 Gastro-esophageal reflux disease without esophagitis: Secondary | ICD-10-CM | POA: Diagnosis not present

## 2024-02-10 DIAGNOSIS — B351 Tinea unguium: Secondary | ICD-10-CM

## 2024-02-10 DIAGNOSIS — M79676 Pain in unspecified toe(s): Secondary | ICD-10-CM | POA: Diagnosis not present

## 2024-02-10 DIAGNOSIS — R2689 Other abnormalities of gait and mobility: Secondary | ICD-10-CM | POA: Diagnosis not present

## 2024-02-10 DIAGNOSIS — R269 Unspecified abnormalities of gait and mobility: Secondary | ICD-10-CM

## 2024-02-10 DIAGNOSIS — E785 Hyperlipidemia, unspecified: Secondary | ICD-10-CM | POA: Diagnosis not present

## 2024-02-10 NOTE — Telephone Encounter (Signed)
 Referral for Physical Therapy, office note and demographics faxed to SNF, [facility] Phone [phone], fax [phone]

## 2024-02-10 NOTE — Progress Notes (Signed)
 She presents today for follow-up of her Charcot arthropathy left lower extremity and painful mycotic nails.  She states that she continues to wear the boot all the time and walk with her walker.  Objective: Vital signs are stable alert oriented x 3.  I reviewed her CT report with her today swelling has come down considerably in the left lower extremity.  CT does relate significant Charcot arthropathy.  Toenails are long thick yellow dystrophic and clinically mycotic  Assessment: Charcot arthropathy left active.  Pain limb secondary to onychomycosis.  Plan: Debrided toenails 1 through 5 bilateral and will continue to utilize the Darco and cam boot and we will follow-up with her in another 6 weeks for set of x-rays.

## 2024-02-12 DIAGNOSIS — R051 Acute cough: Secondary | ICD-10-CM | POA: Diagnosis not present

## 2024-02-14 DIAGNOSIS — M14672 Charcot's joint, left ankle and foot: Secondary | ICD-10-CM | POA: Diagnosis not present

## 2024-02-14 DIAGNOSIS — E119 Type 2 diabetes mellitus without complications: Secondary | ICD-10-CM | POA: Diagnosis not present

## 2024-02-17 DIAGNOSIS — K219 Gastro-esophageal reflux disease without esophagitis: Secondary | ICD-10-CM | POA: Diagnosis not present

## 2024-02-17 DIAGNOSIS — F32A Depression, unspecified: Secondary | ICD-10-CM | POA: Diagnosis not present

## 2024-02-18 ENCOUNTER — Ambulatory Visit (INDEPENDENT_AMBULATORY_CARE_PROVIDER_SITE_OTHER): Admitting: Physician Assistant

## 2024-02-18 DIAGNOSIS — M6281 Muscle weakness (generalized): Secondary | ICD-10-CM | POA: Diagnosis not present

## 2024-02-18 DIAGNOSIS — N3946 Mixed incontinence: Secondary | ICD-10-CM

## 2024-02-18 DIAGNOSIS — I959 Hypotension, unspecified: Secondary | ICD-10-CM | POA: Diagnosis not present

## 2024-02-18 NOTE — Progress Notes (Signed)
 PTNS  Session # 1  Health & Social Factors: Recent hospitalization with cellulitis, now at [facility] for rehab Caffeine : 2 Alcohol : 0 Daytime voids #per day: 3-4 Night-time voids #per night: 1-2 Urgency: severe Incontinence Episodes #per day: 3-4 Ankle used: right Treatment Setting: 16 Feeling/ Response: Sensory Comments: Patient tolerated well.  Performed By: Quantia Grullon, PA-C   Follow Up: 1 week

## 2024-02-18 NOTE — Patient Instructions (Signed)
 Tracking Your Bladder Symptoms   Patient Name:___________________________________________________  Example: Day   Daytime Voids  Nighttime Voids Urgency for the Day (none, mild, strong, severe) Number of Accidents/ Leaks Beverage Comments  Monday IIII II Strong I Water IIII Coffee  I     Week Starting:____________________________________  Day Daytime  Voids Nighttime  Voids Urgency for the Day (none, mild, strong, severe) Number of Accidents/ Leaks Beverages Comments                                                           This week my symptoms were:  O much better O better O the same O worse

## 2024-02-20 DIAGNOSIS — M1009 Idiopathic gout, multiple sites: Secondary | ICD-10-CM | POA: Diagnosis not present

## 2024-02-20 DIAGNOSIS — L03011 Cellulitis of right finger: Secondary | ICD-10-CM | POA: Diagnosis not present

## 2024-02-20 DIAGNOSIS — M14672 Charcot's joint, left ankle and foot: Secondary | ICD-10-CM | POA: Diagnosis not present

## 2024-02-20 DIAGNOSIS — M6281 Muscle weakness (generalized): Secondary | ICD-10-CM | POA: Diagnosis not present

## 2024-02-21 ENCOUNTER — Other Ambulatory Visit: Payer: Self-pay | Admitting: *Deleted

## 2024-02-21 NOTE — Patient Outreach (Signed)
 Per [facility] Denise Sharp resides in [facility]. [facility] SNF waiver utilized for admission to SNF.   Collaboration with Denise Sharp, [facility] psychotherapist. Denise Sharp's transition plan is to return home with spouse. Continues to work with therapy. Transition date not set yet.  Will continue to follow.  Denise Hurst, MSN, RN, BSN [place]  [facility], [facility] RN Post- Acute Care Manager Direct Dial: [phone]

## 2024-02-24 ENCOUNTER — Telehealth: Payer: Self-pay

## 2024-02-24 DIAGNOSIS — L03113 Cellulitis of right upper limb: Secondary | ICD-10-CM | POA: Diagnosis not present

## 2024-02-24 DIAGNOSIS — E1169 Type 2 diabetes mellitus with other specified complication: Secondary | ICD-10-CM | POA: Diagnosis not present

## 2024-02-24 DIAGNOSIS — F411 Generalized anxiety disorder: Secondary | ICD-10-CM | POA: Diagnosis not present

## 2024-02-24 DIAGNOSIS — I4819 Other persistent atrial fibrillation: Secondary | ICD-10-CM | POA: Diagnosis not present

## 2024-02-24 DIAGNOSIS — I679 Cerebrovascular disease, unspecified: Secondary | ICD-10-CM | POA: Diagnosis not present

## 2024-02-24 DIAGNOSIS — E1142 Type 2 diabetes mellitus with diabetic polyneuropathy: Secondary | ICD-10-CM | POA: Diagnosis not present

## 2024-02-24 DIAGNOSIS — M14672 Charcot's joint, left ankle and foot: Secondary | ICD-10-CM | POA: Diagnosis not present

## 2024-02-24 DIAGNOSIS — J45909 Unspecified asthma, uncomplicated: Secondary | ICD-10-CM | POA: Diagnosis not present

## 2024-02-24 DIAGNOSIS — D631 Anemia in chronic kidney disease: Secondary | ICD-10-CM | POA: Diagnosis not present

## 2024-02-24 DIAGNOSIS — M25561 Pain in right knee: Secondary | ICD-10-CM | POA: Diagnosis not present

## 2024-02-24 DIAGNOSIS — I5032 Chronic diastolic (congestive) heart failure: Secondary | ICD-10-CM | POA: Diagnosis not present

## 2024-02-24 DIAGNOSIS — I872 Venous insufficiency (chronic) (peripheral): Secondary | ICD-10-CM | POA: Diagnosis not present

## 2024-02-24 DIAGNOSIS — E662 Morbid (severe) obesity with alveolar hypoventilation: Secondary | ICD-10-CM | POA: Diagnosis not present

## 2024-02-24 DIAGNOSIS — N183 Chronic kidney disease, stage 3 unspecified: Secondary | ICD-10-CM | POA: Diagnosis not present

## 2024-02-24 DIAGNOSIS — M10072 Idiopathic gout, left ankle and foot: Secondary | ICD-10-CM | POA: Diagnosis not present

## 2024-02-24 DIAGNOSIS — F330 Major depressive disorder, recurrent, mild: Secondary | ICD-10-CM | POA: Diagnosis not present

## 2024-02-24 DIAGNOSIS — E785 Hyperlipidemia, unspecified: Secondary | ICD-10-CM | POA: Diagnosis not present

## 2024-02-24 DIAGNOSIS — D509 Iron deficiency anemia, unspecified: Secondary | ICD-10-CM | POA: Diagnosis not present

## 2024-02-24 DIAGNOSIS — E538 Deficiency of other specified B group vitamins: Secondary | ICD-10-CM | POA: Diagnosis not present

## 2024-02-24 DIAGNOSIS — I110 Hypertensive heart disease with heart failure: Secondary | ICD-10-CM | POA: Diagnosis not present

## 2024-02-24 DIAGNOSIS — I25118 Atherosclerotic heart disease of native coronary artery with other forms of angina pectoris: Secondary | ICD-10-CM | POA: Diagnosis not present

## 2024-02-24 DIAGNOSIS — L03116 Cellulitis of left lower limb: Secondary | ICD-10-CM | POA: Diagnosis not present

## 2024-02-24 DIAGNOSIS — K760 Fatty (change of) liver, not elsewhere classified: Secondary | ICD-10-CM | POA: Diagnosis not present

## 2024-02-24 DIAGNOSIS — E1122 Type 2 diabetes mellitus with diabetic chronic kidney disease: Secondary | ICD-10-CM | POA: Diagnosis not present

## 2024-02-24 DIAGNOSIS — E11610 Type 2 diabetes mellitus with diabetic neuropathic arthropathy: Secondary | ICD-10-CM | POA: Diagnosis not present

## 2024-02-24 NOTE — Transitions of Care (Post Inpatient/ED Visit) (Signed)
   02/24/2024  Name: Denise Sharp MRN: [id number] DOB: 1945-11-28  Today's TOC FU Call Status: Today's TOC FU Call Status:: Unsuccessful Call (1st Attempt) Unsuccessful Call (1st Attempt) Date: 02/24/24  Attempted to reach the patient regarding the most recent Inpatient/ED visit. Patient stated she was with the home health nurse and could not talk.  Follow Up Plan: Additional outreach attempts will be made to reach the patient to complete the Transitions of Care (Post Inpatient/ED visit) call.   Signature Avelina Essex, CMA (AAMA)  [facility]- [facility] Program [phone]

## 2024-02-25 ENCOUNTER — Ambulatory Visit (INDEPENDENT_AMBULATORY_CARE_PROVIDER_SITE_OTHER): Admitting: Physician Assistant

## 2024-02-25 VITALS — BP 89/58 | HR 79 | Ht 64.0 in | Wt 232.0 lb

## 2024-02-25 DIAGNOSIS — N3946 Mixed incontinence: Secondary | ICD-10-CM | POA: Diagnosis not present

## 2024-02-25 NOTE — Progress Notes (Signed)
 PTNS  Session # 2  Health & Social Factors: On cefdinir  for UTI. Discharged from rehab and back home. Caffeine : 1-2 Alcohol : 0 Daytime voids #per day: 3-5 Night-time voids #per night: 2-4 Urgency: strong (UTI) Incontinence Episodes #per day: too numerous to count Ankle used: right Treatment Setting: 9 Feeling/ Response: both Comments: Patient tolerated well.  Performed By: Ynez Eugenio, PA-C and Nya Bynum, CMA  Follow Up: 1 week

## 2024-02-25 NOTE — Patient Instructions (Signed)
 Tracking Your Bladder Symptoms   Patient Name:___________________________________________________  Example: Day   Daytime Voids  Nighttime Voids Urgency for the Day (none, mild, strong, severe) Number of Accidents/ Leaks Beverage Comments  Monday IIII II Strong I Water IIII Coffee  I     Week Starting:____________________________________  Day Daytime  Voids Nighttime  Voids Urgency for the Day (none, mild, strong, severe) Number of Accidents/ Leaks Beverages Comments                                                           This week my symptoms were:  O much better O better O the same O worse

## 2024-02-25 NOTE — Patient Instructions (Signed)
 Tracking Your Bladder Symptoms   Patient Name:___________________________________________________  Example: Day   Daytime Voids  Nighttime Voids Urgency for the Day (none, mild, strong, severe) Number of Accidents/ Leaks Beverage Comments  Monday IIII II Strong I Water  IIII Coffee  I     Week Starting:____________________________________  Day Daytime  Voids Nighttime  Voids Urgency for the Day (none, mild, strong, severe) Number of Accidents/ Leaks Beverages Comments                                                           This week my symptoms were:  O much better O better O the same O worse

## 2024-02-26 ENCOUNTER — Ambulatory Visit: Admitting: Nurse Practitioner

## 2024-02-26 VITALS — BP 118/62 | HR 102 | Temp 98.4°F | Ht 64.0 in

## 2024-02-26 DIAGNOSIS — E11610 Type 2 diabetes mellitus with diabetic neuropathic arthropathy: Secondary | ICD-10-CM

## 2024-02-26 DIAGNOSIS — L03113 Cellulitis of right upper limb: Secondary | ICD-10-CM | POA: Diagnosis not present

## 2024-02-26 DIAGNOSIS — R399 Unspecified symptoms and signs involving the genitourinary system: Secondary | ICD-10-CM | POA: Diagnosis not present

## 2024-02-26 DIAGNOSIS — I959 Hypotension, unspecified: Secondary | ICD-10-CM | POA: Diagnosis not present

## 2024-02-26 DIAGNOSIS — Z09 Encounter for follow-up examination after completed treatment for conditions other than malignant neoplasm: Secondary | ICD-10-CM

## 2024-02-26 NOTE — Progress Notes (Signed)
 Leron Glance, NP-C Phone: [phone]  Denise Sharp is a 78 y.o. female who presents today for hospital follow up.   Discussed the use of AI scribe software for clinical note transcription with the patient, who gave verbal consent to proceed.  History of Present Illness   Denise Sharp is a 78 year old female who presents for a hospital follow-up.  She was hospitalized and stayed in a nursing home for nearly three weeks due to complications from untreated gout that began in March. She initially did not receive pain medication due to differing opinions among her doctors. Eventually, she was placed in a boot due to inability to bear weight on her foot.  She developed cellulitis in her foot and hand, leading to hospitalization where she received IV antibiotics. Post-hospitalization, she was transferred to a nursing home for physical therapy, which was not properly administered due to a misunderstanding of orders. During her stay, she experienced a fall while being transferred to a wheelchair, resulting in back pain and a possible shoulder injury. She also developed a UTI, for which she is currently on a five-day course of antibiotics.  She reports issues with blood pressure management, experiencing both high and low readings. She was on midodrine for low blood pressure in the nursing home and is currently taking torsemide . Her blood pressure was notably low during a recent visit to another doctor's office.  She reports that Valium  was administered nightly in the nursing home without her prior knowledge, and now she is not sleeping at all since being sent home without it. She is also managing pain with tramadol  and tizanidine , which were given more frequently than prescribed during her nursing home stay. She is attempting to space out these medications to regain their effectiveness.  She is currently receiving home health services, including nursing, physical therapy, and occupational  therapy. She is struggling with daily activities and lacks assistance at home, as her partner is also dealing with significant health issues.      Social History   Tobacco Use  Smoking Status Never   Passive exposure: Never  Smokeless Tobacco Never    No current facility-administered medications on file prior to visit.   Current Outpatient Medications on File Prior to Visit  Medication Sig Dispense Refill   allopurinol  (ZYLOPRIM ) 100 MG tablet Take 1 tablet (100 mg total) by mouth daily. (Patient not taking: Reported on 03/03/2024) 30 tablet 1   apixaban  (ELIQUIS ) 5 MG TABS tablet Take 1 tablet (5 mg total) by mouth 2 (two) times daily. 180 tablet 1   atorvastatin  (LIPITOR ) 80 MG tablet Take 1 tablet (80 mg total) by mouth daily. 90 tablet 1   budesonide -formoterol  (SYMBICORT ) 160-4.5 MCG/ACT inhaler Inhale 2 puffs into the lungs 2 (two) times daily. 1 each 6   cefdinir  (OMNICEF ) 300 MG capsule Take 300 mg by mouth 2 (two) times daily.     Cholecalciferol  (VITAMIN D3) 250 MCG (10000 UT) capsule Take 10,000 Units by mouth daily.     clotrimazole -betamethasone  (LOTRISONE ) cream Apply 1 application topically 2 (two) times daily as needed (irritation).     colchicine  0.6 MG tablet Take 1 tablet (0.6 mg total) by mouth 2 (two) times daily. 60 tablet 0   conjugated estrogens  (PREMARIN ) vaginal cream Apply one pea-sized amount around the opening of the urethra daily for 2 weeks, then 3 times weekly moving forward. 30 g 4   Continuous Glucose Sensor (FREESTYLE LIBRE 3 PLUS SENSOR) MISC Change sensor every 15 days.  6 each 3   cyanocobalamin  (VITAMIN B12) 1000 MCG/ML injection Inject 1 mL (1,000 mcg total) into the muscle every 30 (thirty) days. Inject 1 ml (1000 mcg ) IM weekly x 4,  Then monthly thereafter 3 mL 1   dapagliflozin  propanediol (FARXIGA ) 10 MG TABS tablet Take 1 tablet (10 mg total) by mouth daily. 90 tablet 3   dicyclomine  (BENTYL ) 20 MG tablet Take 1 tablet (20 mg total) by mouth 3  (three) times daily before meals. 180 tablet 2   DULoxetine  (CYMBALTA ) 60 MG capsule Take 1 capsule (60 mg total) by mouth daily. 90 capsule 3   ezetimibe  (ZETIA ) 10 MG tablet Take 1 tablet (10 mg total) by mouth daily. 90 tablet 3   fexofenadine (ALLEGRA) 180 MG tablet Take 180 mg by mouth as needed for allergies or rhinitis.     Finerenone  (KERENDIA ) 10 MG TABS Take 1 tablet (10 mg total) by mouth in the morning. 90 tablet 3   fluticasone  (FLONASE ) 50 MCG/ACT nasal spray Place 2 sprays into both nostrils daily. 16 g 6   Glucagon  (GVOKE HYPOPEN  2-PACK) 0.5 MG/0.1ML SOAJ Inject 1 auto injector subcutaneously as needed for severe hypoglycemia. May repeat x 1 after 15 minutes if needed. 0.1 mL 2   glucose blood (ONETOUCH VERIO) test strip Use to check blood sugar twice daily. 100 each 1   insulin  aspart (NOVOLOG  FLEXPEN) 100 UNIT/ML FlexPen Inject 8-14 Units into the skin 3 (three) times daily with meals. 36 mL 3   insulin  degludec (TRESIBA  FLEXTOUCH) 100 UNIT/ML FlexTouch Pen Inject 25 Units into the skin daily. 15 mL 3   Insulin  Pen Needle (DROPLET PEN NEEDLES) 31G X 5 MM MISC USE ONE NEEDLE SUBCUTANEOUSLY AS DIRECTED (REMOVE AND DISCARD NEEDLE IN SHARPS CONTAINER IMMEDIATELY AFTER USE) 100 each 1   lidocaine  (LIDODERM ) 5 % PLACE 1 PATCH ONTO THE SKIN DAILY. REMOVE & DISCARD PATCH WITHIN 12 HOURS OR AS DIRECTED BY MD 30 patch 0   loperamide  (IMODIUM ) 2 MG capsule Take 4-6 mg by mouth 3 (three) times daily before meals.     nitroGLYCERIN  (NITROSTAT ) 0.4 MG SL tablet Place 1 tablet (0.4 mg total) under the tongue every 5 (five) minutes as needed for chest pain. 25 tablet 5   nystatin  (MYCOSTATIN /NYSTOP ) powder Apply 1 g topically 4 (four) times daily as needed (rash).     ondansetron  (ZOFRAN -ODT) 4 MG disintegrating tablet Take 1 tablet (4 mg total) by mouth every 8 (eight) hours as needed for nausea or vomiting. 30 tablet 0   OneTouch Delica Lancets 33G MISC Used to check blood sugar two times a day.  100 each 4   OVER THE COUNTER MEDICATION Apply 1 application  topically daily as needed (pain). [place] Gel topical pain reliever     pantoprazole  (PROTONIX ) 40 MG tablet Take 1 tablet (40 mg total) by mouth daily. 90 tablet 3   Polyethyl Glycol-Propyl Glycol (SYSTANE OP) Place 1 drop into both eyes 4 (four) times daily as needed (dry eyes).     potassium chloride  20 MEQ/15ML (10%) SOLN Take 15 mLs (20 mEq total) by mouth daily. 473 mL 3   pregabalin  (LYRICA ) 50 MG capsule Take 1 capsule (50 mg total) by mouth 2 (two) times daily. 180 capsule 1   tiZANidine  (ZANAFLEX ) 4 MG tablet Take 1 tablet (4 mg total) by mouth in the morning and at bedtime. 60 tablet 0   torsemide  (DEMADEX ) 20 MG tablet Take 1 tablet (20 mg total) by mouth daily. 90  tablet 3   traMADol  (ULTRAM ) 50 MG tablet Take 1 tablet (50 mg total) by mouth every 6 (six) hours as needed for severe pain (pain score 7-10) or moderate pain (pain score 4-6). 10 tablet 0     ROS see history of present illness  Objective  Physical Exam Vitals:   02/26/24 1112  BP: 118/62  Pulse: (!) 102  Temp: 98.4 F (36.9 C)  SpO2: 96%    BP Readings from Last 3 Encounters:  03/09/24 (!) 114/59  02/26/24 118/62  02/25/24 (!) 89/58   Wt Readings from Last 3 Encounters:  03/08/24 248 lb 14.4 oz (112.9 kg)  02/25/24 232 lb (105.2 kg)  02/03/24 245 lb 2.4 oz (111.2 kg)    Physical Exam Constitutional:      General: She is not in acute distress.    Appearance: Normal appearance.  HENT:     Head: Normocephalic.  Cardiovascular:     Rate and Rhythm: Normal rate and regular rhythm.     Heart sounds: Normal heart sounds.  Pulmonary:     Effort: Pulmonary effort is normal.     Breath sounds: Normal breath sounds.  Skin:    General: Skin is warm and dry.  Neurological:     General: No focal deficit present.     Mental Status: She is alert.  Psychiatric:        Mood and Affect: Mood normal.        Behavior: Behavior normal.       Assessment/Plan: Please see individual problem list.  Hospital discharge follow-up Assessment & Plan: Discharged from nursing facility 4 days ago after hospitalization for cellulitis and charcot arthropathy. Summary, labs, and imaging reviewed today. Patient has home health, PT and OT. No questions at this time.   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 Under podiatrist care with a follow-up in September for repeat x-rays. A special brace may be considered if the foot does not improve. Follow up with specialists as scheduled.    Cellulitis of right hand Assessment & Plan: Cellulitis improved after IV and oral antibiotics. Blood cultures negative.    UTI symptoms Assessment & Plan: Recently diagnosed with a UTI while in nursing facility. She was start on a 5-day antibiotic course. Symptoms improving. Encouraged to complete antibiotic as prescribed. Advise adequate hydration. Return precautions given to patient.    Hypotension, unspecified hypotension type Assessment & Plan: Experiences low blood pressure episodes, with readings as low as 79/40 mmHg. Blood pressure is well-managed at 118/62 mmHg during the visit. She takes midodrine. Plans to follow up with the heart failure clinic for blood pressure management. Strict precautions given to patient.       Return for with PCP.   Leron Glance, NP-C [facility]

## 2024-02-27 ENCOUNTER — Telehealth: Payer: Self-pay

## 2024-02-27 DIAGNOSIS — M10072 Idiopathic gout, left ankle and foot: Secondary | ICD-10-CM | POA: Diagnosis not present

## 2024-02-27 DIAGNOSIS — L03116 Cellulitis of left lower limb: Secondary | ICD-10-CM | POA: Diagnosis not present

## 2024-02-27 DIAGNOSIS — M14672 Charcot's joint, left ankle and foot: Secondary | ICD-10-CM | POA: Diagnosis not present

## 2024-02-27 DIAGNOSIS — L03113 Cellulitis of right upper limb: Secondary | ICD-10-CM | POA: Diagnosis not present

## 2024-02-27 DIAGNOSIS — E1122 Type 2 diabetes mellitus with diabetic chronic kidney disease: Secondary | ICD-10-CM | POA: Diagnosis not present

## 2024-02-27 DIAGNOSIS — E11610 Type 2 diabetes mellitus with diabetic neuropathic arthropathy: Secondary | ICD-10-CM | POA: Diagnosis not present

## 2024-02-27 NOTE — Telephone Encounter (Signed)
 Copied from CRM [id number]. Topic: Clinical - Home Health Verbal Orders >> Feb 27, 2024  3:29 PM Pinkey ORN wrote: Caller/Agency: [facility]wyn[facility] / [facility] Callback Number: [phone] Service Requested: Skilled Nursing Frequency: 1 week 1 , 2 week 2, 1 week 4 for medication education / evaluation of cellulitis of left hand and the edema in her left foot.  Any new concerns about the patient? No

## 2024-02-27 NOTE — Telephone Encounter (Signed)
 Verbal orders have been given

## 2024-02-27 NOTE — Telephone Encounter (Signed)
 Copied from CRM [id number]. Topic: Clinical - Home Health Verbal Orders >> Feb 27, 2024  3:34 PM Pinkey ORN wrote: Caller/Agency: Gwynneth / [facility] Callback Number: [phone] Service Requested: Physical Therapy + Occupational Therapy  Frequency: Evaluation  Any new concerns about the patient? No

## 2024-02-28 NOTE — Telephone Encounter (Signed)
 Verbal orders given

## 2024-02-28 NOTE — Telephone Encounter (Unsigned)
 Copied from CRM #[id number]. Topic: Clinical - Home Health Verbal Orders >> Feb 27, 2024  4:40 PM [place] J wrote: Caller/Agency: [facility] / [facility] Callback Number: [phone] Service Requested: Occupational Therapy Frequency: 1 time a week for 3 weeks; Hold 1 week; 1 time a week for 2 weeks.  Any new concerns about the patient? No *Voice mail box is a secured line.*

## 2024-03-02 ENCOUNTER — Ambulatory Visit: Payer: Self-pay

## 2024-03-02 ENCOUNTER — Telehealth: Payer: Self-pay

## 2024-03-02 ENCOUNTER — Telehealth: Payer: Self-pay | Admitting: *Deleted

## 2024-03-02 ENCOUNTER — Telehealth: Payer: Self-pay | Admitting: Internal Medicine

## 2024-03-02 DIAGNOSIS — A09 Infectious gastroenteritis and colitis, unspecified: Secondary | ICD-10-CM

## 2024-03-02 NOTE — Telephone Encounter (Signed)
 Patient left a message asking where were we sending her prescriptions for  her sensors. I see that in June we sent the request to [facility]. A message was left for the patient with this information. In her message she states that she thought they were coming from [facility]. A message was left sharing with her where her last prescription was sent in.

## 2024-03-02 NOTE — Telephone Encounter (Signed)
 Copied from CRM #[id number]. Topic: Clinical - Prescription Issue >> Mar 02, 2024  8:34 AM Essie A wrote: Reason for CRM: Gwynneth Barefoot from [facility] called to report 2 leve2 2 medication interactions.  Allopurinol  interacts with Tizanidine , weak Cyp1a2 inhibitor and Kerendia  interacts with Potassium Chloride , severe inactions.  Please return the call at [phone] (secure).

## 2024-03-02 NOTE — Telephone Encounter (Signed)
 Visiting nurse reports:  4-5 episodes of yellow, mucousy diarrhea today. Recently completed ceftinir on Friday for UTI - tx at [facility]. States she is also having dysuria. Also appears to be developing cellulitis on left hand. Also has wound on left foot where her boot rubs, approx 2x1 cm. Requesting wound care orders. Visiting nurse has notified podiatrist.  This RN attempted to contact patient and husband at each listed number. VM left requesting call back.

## 2024-03-02 NOTE — Telephone Encounter (Signed)
 Message from Denise Sharp sent at 03/02/2024  4:11 PM EDT  Summary: Diarrhea/Mucous   [facility] nurse of patient is calling in due to her stating her has had diarrhea today (4/5) bile movements with brown mucous in stool, also has other concerning reports she wants to give. Please call back at [phone]

## 2024-03-02 NOTE — Telephone Encounter (Signed)
 Reason for Disposition . [1] Mucus or pus in stool AND [2] present > 2 days AND [3] diarrhea is more than mild  Answer Assessment - Initial Assessment Questions 1. DIARRHEA SEVERITY: How bad is the diarrhea? How many more stools have you had in the past 24 hours than normal?      4-5 times today 2. ONSET: When did the diarrhea begin?      Chronic 3. STOOL DESCRIPTION:  How loose or watery is the diarrhea? What is the stool color? Is there any blood or mucous in the stool?     Loose, mucous, yellow in color 4. VOMITING: Are you also vomiting? If Yes, ask: How many times in the past 24 hours?      nausea 5. ABDOMEN PAIN: Are you having any abdomen pain? If Yes, ask: What does it feel like? (e.g., crampy, dull, intermittent, constant)      denies 6. ABDOMEN PAIN SEVERITY: If present, ask: How bad is the pain?  (e.g., Scale 1-10; mild, moderate, or severe)       7. ORAL INTAKE: If vomiting, Have you been able to drink liquids? How much liquids have you had in the past 24 hours?     Not drinking much 8. HYDRATION: Any signs of dehydration? (e.g., dry mouth [not just dry lips], too weak to stand, dizziness, new weight loss) When did you last urinate?     Feels hydrated 9. EXPOSURE: Have you traveled to a foreign country recently? Have you been exposed to anyone with diarrhea? Could you have eaten any food that was spoiled?     Denies  10. ANTIBIOTIC USE: Are you taking antibiotics now or have you taken antibiotics in the past 2 months?       Na 11. OTHER SYMPTOMS: Do you have any other symptoms? (e.g., fever, blood in stool)       Left hand pain-worried she may have cellulitis again.   Additional info:  1) Recently completed abx for UTI-Has urology appointment 03/03/24 2) Has new wound on left foot from boot rub-Home Health has alerted podiatry.  3) First call in today was from home health taken by an other triage nurse.  Patient returning triage nurse  call and stated it was to schedule with Dr. Marylynn tomorrow, that Dr. Marylynn wants her scheduled. Chart reviewed,  let patient know that is was triage nurse who was attempting to schedule her an acute visit for increased diarrhea and hand pain. No acute visits available at practice until 03/09/24.  Patient shares she has a lab appointment at 11:30am and wondering if Dr. Marylynn would like to add on additional labs since she is a hard stick and if Dr. Marylynn would be able to schedule her just before the labs around 11.  Protocols used: [facility]

## 2024-03-02 NOTE — Telephone Encounter (Signed)
 Patient returned call and has been schedule for lab appt tomorrow 03/03/24 at 11:30 am.

## 2024-03-02 NOTE — Telephone Encounter (Signed)
 FYI Only or Action Required?: Action required by provider: request for appointment.  Patient was last seen in primary care on 02/26/2024 by Gretel App, NP.  Called Nurse Triage reporting Diarrhea and Hand Pain.  Symptoms began several days ago.  Interventions attempted: Rest, hydration, or home remedies.  Symptoms are: gradually worsening.  Triage Disposition: See Physician Within 24 Hours  Patient/caregiver understands and will follow disposition?: Unsure

## 2024-03-02 NOTE — Telephone Encounter (Signed)
 LMTCB. Please schedule the lab appt when pt calls back.

## 2024-03-02 NOTE — Telephone Encounter (Signed)
Need lab order please.

## 2024-03-02 NOTE — Telephone Encounter (Signed)
 Pt called stating that her blood pressures were running in the 70's systolic and that she has been having chest pain/ left arm pain. Pt also states that she had a fever of 100.5 yesterday and does not think her cellulitis was cured by the antibiotics. Advised that she should be seen in the emergency room.

## 2024-03-03 ENCOUNTER — Inpatient Hospital Stay

## 2024-03-03 ENCOUNTER — Other Ambulatory Visit: Payer: Self-pay

## 2024-03-03 ENCOUNTER — Other Ambulatory Visit (INDEPENDENT_AMBULATORY_CARE_PROVIDER_SITE_OTHER)

## 2024-03-03 ENCOUNTER — Inpatient Hospital Stay
Admission: EM | Admit: 2024-03-03 | Discharge: 2024-03-16 | DRG: 853 | Disposition: A | Discharge status: Skilled Nursing Facility | Attending: Internal Medicine | Admitting: Internal Medicine

## 2024-03-03 ENCOUNTER — Ambulatory Visit (INDEPENDENT_AMBULATORY_CARE_PROVIDER_SITE_OTHER): Admitting: Physician Assistant

## 2024-03-03 ENCOUNTER — Ambulatory Visit: Admitting: Podiatry

## 2024-03-03 DIAGNOSIS — E876 Hypokalemia: Secondary | ICD-10-CM | POA: Diagnosis not present

## 2024-03-03 DIAGNOSIS — I6782 Cerebral ischemia: Secondary | ICD-10-CM | POA: Diagnosis not present

## 2024-03-03 DIAGNOSIS — M6259 Muscle wasting and atrophy, not elsewhere classified, multiple sites: Secondary | ICD-10-CM | POA: Diagnosis not present

## 2024-03-03 DIAGNOSIS — Z823 Family history of stroke: Secondary | ICD-10-CM

## 2024-03-03 DIAGNOSIS — M25562 Pain in left knee: Secondary | ICD-10-CM | POA: Diagnosis present

## 2024-03-03 DIAGNOSIS — Z741 Need for assistance with personal care: Secondary | ICD-10-CM | POA: Diagnosis not present

## 2024-03-03 DIAGNOSIS — G4733 Obstructive sleep apnea (adult) (pediatric): Secondary | ICD-10-CM | POA: Diagnosis not present

## 2024-03-03 DIAGNOSIS — E559 Vitamin D deficiency, unspecified: Secondary | ICD-10-CM | POA: Diagnosis not present

## 2024-03-03 DIAGNOSIS — E1152 Type 2 diabetes mellitus with diabetic peripheral angiopathy with gangrene: Secondary | ICD-10-CM | POA: Diagnosis present

## 2024-03-03 DIAGNOSIS — L03116 Cellulitis of left lower limb: Principal | ICD-10-CM | POA: Diagnosis present

## 2024-03-03 DIAGNOSIS — Z881 Allergy status to other antibiotic agents status: Secondary | ICD-10-CM

## 2024-03-03 DIAGNOSIS — M109 Gout, unspecified: Secondary | ICD-10-CM | POA: Diagnosis not present

## 2024-03-03 DIAGNOSIS — Z89512 Acquired absence of left leg below knee: Secondary | ICD-10-CM | POA: Diagnosis not present

## 2024-03-03 DIAGNOSIS — I5032 Chronic diastolic (congestive) heart failure: Secondary | ICD-10-CM | POA: Diagnosis present

## 2024-03-03 DIAGNOSIS — E11628 Type 2 diabetes mellitus with other skin complications: Secondary | ICD-10-CM | POA: Diagnosis not present

## 2024-03-03 DIAGNOSIS — R6520 Severe sepsis without septic shock: Secondary | ICD-10-CM | POA: Diagnosis present

## 2024-03-03 DIAGNOSIS — R531 Weakness: Secondary | ICD-10-CM

## 2024-03-03 DIAGNOSIS — Z794 Long term (current) use of insulin: Secondary | ICD-10-CM | POA: Diagnosis not present

## 2024-03-03 DIAGNOSIS — Y92239 Unspecified place in hospital as the place of occurrence of the external cause: Secondary | ICD-10-CM | POA: Diagnosis not present

## 2024-03-03 DIAGNOSIS — A419 Sepsis, unspecified organism: Secondary | ICD-10-CM | POA: Diagnosis not present

## 2024-03-03 DIAGNOSIS — I130 Hypertensive heart and chronic kidney disease with heart failure and stage 1 through stage 4 chronic kidney disease, or unspecified chronic kidney disease: Secondary | ICD-10-CM | POA: Diagnosis present

## 2024-03-03 DIAGNOSIS — L03114 Cellulitis of left upper limb: Secondary | ICD-10-CM | POA: Diagnosis not present

## 2024-03-03 DIAGNOSIS — F05 Delirium due to known physiological condition: Secondary | ICD-10-CM | POA: Diagnosis not present

## 2024-03-03 DIAGNOSIS — K760 Fatty (change of) liver, not elsewhere classified: Secondary | ICD-10-CM | POA: Diagnosis not present

## 2024-03-03 DIAGNOSIS — F419 Anxiety disorder, unspecified: Secondary | ICD-10-CM | POA: Diagnosis present

## 2024-03-03 DIAGNOSIS — N2581 Secondary hyperparathyroidism of renal origin: Secondary | ICD-10-CM | POA: Diagnosis present

## 2024-03-03 DIAGNOSIS — R609 Edema, unspecified: Secondary | ICD-10-CM | POA: Diagnosis not present

## 2024-03-03 DIAGNOSIS — Z96611 Presence of right artificial shoulder joint: Secondary | ICD-10-CM | POA: Diagnosis present

## 2024-03-03 DIAGNOSIS — L97529 Non-pressure chronic ulcer of other part of left foot with unspecified severity: Secondary | ICD-10-CM | POA: Diagnosis present

## 2024-03-03 DIAGNOSIS — K529 Noninfective gastroenteritis and colitis, unspecified: Secondary | ICD-10-CM | POA: Diagnosis present

## 2024-03-03 DIAGNOSIS — E119 Type 2 diabetes mellitus without complications: Secondary | ICD-10-CM | POA: Diagnosis not present

## 2024-03-03 DIAGNOSIS — G5792 Unspecified mononeuropathy of left lower limb: Secondary | ICD-10-CM | POA: Diagnosis not present

## 2024-03-03 DIAGNOSIS — M14672 Charcot's joint, left ankle and foot: Secondary | ICD-10-CM | POA: Diagnosis not present

## 2024-03-03 DIAGNOSIS — N1832 Chronic kidney disease, stage 3b: Secondary | ICD-10-CM | POA: Diagnosis not present

## 2024-03-03 DIAGNOSIS — E785 Hyperlipidemia, unspecified: Secondary | ICD-10-CM | POA: Diagnosis present

## 2024-03-03 DIAGNOSIS — Z90722 Acquired absence of ovaries, bilateral: Secondary | ICD-10-CM

## 2024-03-03 DIAGNOSIS — Z90710 Acquired absence of both cervix and uterus: Secondary | ICD-10-CM

## 2024-03-03 DIAGNOSIS — T428X5A Adverse effect of antiparkinsonism drugs and other central muscle-tone depressants, initial encounter: Secondary | ICD-10-CM | POA: Diagnosis not present

## 2024-03-03 DIAGNOSIS — F32A Depression, unspecified: Secondary | ICD-10-CM | POA: Diagnosis not present

## 2024-03-03 DIAGNOSIS — R197 Diarrhea, unspecified: Secondary | ICD-10-CM | POA: Diagnosis not present

## 2024-03-03 DIAGNOSIS — R300 Dysuria: Secondary | ICD-10-CM | POA: Diagnosis not present

## 2024-03-03 DIAGNOSIS — Z818 Family history of other mental and behavioral disorders: Secondary | ICD-10-CM

## 2024-03-03 DIAGNOSIS — E1169 Type 2 diabetes mellitus with other specified complication: Secondary | ICD-10-CM | POA: Diagnosis present

## 2024-03-03 DIAGNOSIS — E1122 Type 2 diabetes mellitus with diabetic chronic kidney disease: Secondary | ICD-10-CM

## 2024-03-03 DIAGNOSIS — K5900 Constipation, unspecified: Secondary | ICD-10-CM | POA: Diagnosis not present

## 2024-03-03 DIAGNOSIS — I2510 Atherosclerotic heart disease of native coronary artery without angina pectoris: Secondary | ICD-10-CM | POA: Diagnosis present

## 2024-03-03 DIAGNOSIS — M1A9XX1 Chronic gout, unspecified, with tophus (tophi): Secondary | ICD-10-CM | POA: Diagnosis present

## 2024-03-03 DIAGNOSIS — Z9889 Other specified postprocedural states: Secondary | ICD-10-CM

## 2024-03-03 DIAGNOSIS — R2681 Unsteadiness on feet: Secondary | ICD-10-CM | POA: Diagnosis not present

## 2024-03-03 DIAGNOSIS — M25475 Effusion, left foot: Secondary | ICD-10-CM | POA: Diagnosis not present

## 2024-03-03 DIAGNOSIS — M25472 Effusion, left ankle: Secondary | ICD-10-CM | POA: Diagnosis not present

## 2024-03-03 DIAGNOSIS — Z4781 Encounter for orthopedic aftercare following surgical amputation: Secondary | ICD-10-CM | POA: Diagnosis not present

## 2024-03-03 DIAGNOSIS — I428 Other cardiomyopathies: Secondary | ICD-10-CM | POA: Diagnosis present

## 2024-03-03 DIAGNOSIS — E1142 Type 2 diabetes mellitus with diabetic polyneuropathy: Secondary | ICD-10-CM | POA: Diagnosis present

## 2024-03-03 DIAGNOSIS — E872 Acidosis, unspecified: Secondary | ICD-10-CM | POA: Diagnosis present

## 2024-03-03 DIAGNOSIS — N179 Acute kidney failure, unspecified: Secondary | ICD-10-CM | POA: Diagnosis not present

## 2024-03-03 DIAGNOSIS — Z8262 Family history of osteoporosis: Secondary | ICD-10-CM

## 2024-03-03 DIAGNOSIS — Z6841 Body Mass Index (BMI) 40.0 and over, adult: Secondary | ICD-10-CM

## 2024-03-03 DIAGNOSIS — R440 Auditory hallucinations: Secondary | ICD-10-CM | POA: Diagnosis not present

## 2024-03-03 DIAGNOSIS — I96 Gangrene, not elsewhere classified: Secondary | ICD-10-CM | POA: Diagnosis not present

## 2024-03-03 DIAGNOSIS — R410 Disorientation, unspecified: Secondary | ICD-10-CM | POA: Diagnosis not present

## 2024-03-03 DIAGNOSIS — M869 Osteomyelitis, unspecified: Secondary | ICD-10-CM | POA: Diagnosis present

## 2024-03-03 DIAGNOSIS — L03113 Cellulitis of right upper limb: Secondary | ICD-10-CM | POA: Diagnosis present

## 2024-03-03 DIAGNOSIS — M25572 Pain in left ankle and joints of left foot: Secondary | ICD-10-CM

## 2024-03-03 DIAGNOSIS — Z882 Allergy status to sulfonamides status: Secondary | ICD-10-CM

## 2024-03-03 DIAGNOSIS — R6510 Systemic inflammatory response syndrome (SIRS) of non-infectious origin without acute organ dysfunction: Secondary | ICD-10-CM

## 2024-03-03 DIAGNOSIS — Z79899 Other long term (current) drug therapy: Secondary | ICD-10-CM | POA: Diagnosis not present

## 2024-03-03 DIAGNOSIS — E162 Hypoglycemia, unspecified: Secondary | ICD-10-CM | POA: Diagnosis not present

## 2024-03-03 DIAGNOSIS — L089 Local infection of the skin and subcutaneous tissue, unspecified: Secondary | ICD-10-CM | POA: Diagnosis not present

## 2024-03-03 DIAGNOSIS — E662 Morbid (severe) obesity with alveolar hypoventilation: Secondary | ICD-10-CM | POA: Diagnosis not present

## 2024-03-03 DIAGNOSIS — Z955 Presence of coronary angioplasty implant and graft: Secondary | ICD-10-CM

## 2024-03-03 DIAGNOSIS — E46 Unspecified protein-calorie malnutrition: Secondary | ICD-10-CM | POA: Diagnosis not present

## 2024-03-03 DIAGNOSIS — Z9049 Acquired absence of other specified parts of digestive tract: Secondary | ICD-10-CM

## 2024-03-03 DIAGNOSIS — Z751 Person awaiting admission to adequate facility elsewhere: Secondary | ICD-10-CM

## 2024-03-03 DIAGNOSIS — Z8739 Personal history of other diseases of the musculoskeletal system and connective tissue: Secondary | ICD-10-CM

## 2024-03-03 DIAGNOSIS — Z8349 Family history of other endocrine, nutritional and metabolic diseases: Secondary | ICD-10-CM

## 2024-03-03 DIAGNOSIS — N183 Chronic kidney disease, stage 3 unspecified: Secondary | ICD-10-CM | POA: Diagnosis not present

## 2024-03-03 DIAGNOSIS — R5381 Other malaise: Secondary | ICD-10-CM | POA: Diagnosis not present

## 2024-03-03 DIAGNOSIS — Z7951 Long term (current) use of inhaled steroids: Secondary | ICD-10-CM

## 2024-03-03 DIAGNOSIS — L0390 Cellulitis, unspecified: Secondary | ICD-10-CM | POA: Diagnosis not present

## 2024-03-03 DIAGNOSIS — A5216 Charcot's arthropathy (tabetic): Secondary | ICD-10-CM | POA: Diagnosis not present

## 2024-03-03 DIAGNOSIS — R601 Generalized edema: Secondary | ICD-10-CM | POA: Diagnosis not present

## 2024-03-03 DIAGNOSIS — I4819 Other persistent atrial fibrillation: Secondary | ICD-10-CM | POA: Diagnosis present

## 2024-03-03 DIAGNOSIS — H04123 Dry eye syndrome of bilateral lacrimal glands: Secondary | ICD-10-CM | POA: Diagnosis not present

## 2024-03-03 DIAGNOSIS — M19072 Primary osteoarthritis, left ankle and foot: Secondary | ICD-10-CM | POA: Diagnosis not present

## 2024-03-03 DIAGNOSIS — F329 Major depressive disorder, single episode, unspecified: Secondary | ICD-10-CM | POA: Diagnosis present

## 2024-03-03 DIAGNOSIS — D509 Iron deficiency anemia, unspecified: Secondary | ICD-10-CM | POA: Diagnosis present

## 2024-03-03 DIAGNOSIS — F411 Generalized anxiety disorder: Secondary | ICD-10-CM | POA: Diagnosis not present

## 2024-03-03 DIAGNOSIS — N3946 Mixed incontinence: Secondary | ICD-10-CM | POA: Diagnosis not present

## 2024-03-03 DIAGNOSIS — I25118 Atherosclerotic heart disease of native coronary artery with other forms of angina pectoris: Secondary | ICD-10-CM | POA: Diagnosis present

## 2024-03-03 DIAGNOSIS — G2581 Restless legs syndrome: Secondary | ICD-10-CM | POA: Diagnosis not present

## 2024-03-03 DIAGNOSIS — G546 Phantom limb syndrome with pain: Secondary | ICD-10-CM | POA: Diagnosis not present

## 2024-03-03 DIAGNOSIS — E569 Vitamin deficiency, unspecified: Secondary | ICD-10-CM | POA: Diagnosis not present

## 2024-03-03 DIAGNOSIS — Z8249 Family history of ischemic heart disease and other diseases of the circulatory system: Secondary | ICD-10-CM

## 2024-03-03 DIAGNOSIS — M6281 Muscle weakness (generalized): Secondary | ICD-10-CM | POA: Diagnosis not present

## 2024-03-03 DIAGNOSIS — D631 Anemia in chronic kidney disease: Secondary | ICD-10-CM | POA: Diagnosis present

## 2024-03-03 DIAGNOSIS — Z7901 Long term (current) use of anticoagulants: Secondary | ICD-10-CM

## 2024-03-03 DIAGNOSIS — I4891 Unspecified atrial fibrillation: Secondary | ICD-10-CM | POA: Diagnosis not present

## 2024-03-03 DIAGNOSIS — E11610 Type 2 diabetes mellitus with diabetic neuropathic arthropathy: Secondary | ICD-10-CM | POA: Diagnosis present

## 2024-03-03 DIAGNOSIS — M7732 Calcaneal spur, left foot: Secondary | ICD-10-CM | POA: Diagnosis not present

## 2024-03-03 DIAGNOSIS — G928 Other toxic encephalopathy: Secondary | ICD-10-CM | POA: Diagnosis not present

## 2024-03-03 DIAGNOSIS — I69921 Dysphasia following unspecified cerebrovascular disease: Secondary | ICD-10-CM | POA: Diagnosis not present

## 2024-03-03 DIAGNOSIS — Z96653 Presence of artificial knee joint, bilateral: Secondary | ICD-10-CM | POA: Diagnosis present

## 2024-03-03 DIAGNOSIS — Z8042 Family history of malignant neoplasm of prostate: Secondary | ICD-10-CM

## 2024-03-03 DIAGNOSIS — Z9884 Bariatric surgery status: Secondary | ICD-10-CM

## 2024-03-03 DIAGNOSIS — I110 Hypertensive heart disease with heart failure: Secondary | ICD-10-CM | POA: Diagnosis not present

## 2024-03-03 DIAGNOSIS — E1149 Type 2 diabetes mellitus with other diabetic neurological complication: Secondary | ICD-10-CM | POA: Diagnosis present

## 2024-03-03 DIAGNOSIS — N1831 Chronic kidney disease, stage 3a: Secondary | ICD-10-CM | POA: Diagnosis not present

## 2024-03-03 DIAGNOSIS — M10072 Idiopathic gout, left ankle and foot: Secondary | ICD-10-CM | POA: Diagnosis not present

## 2024-03-03 DIAGNOSIS — Z888 Allergy status to other drugs, medicaments and biological substances status: Secondary | ICD-10-CM

## 2024-03-03 DIAGNOSIS — Z833 Family history of diabetes mellitus: Secondary | ICD-10-CM

## 2024-03-03 DIAGNOSIS — R7982 Elevated C-reactive protein (CRP): Secondary | ICD-10-CM | POA: Diagnosis present

## 2024-03-03 DIAGNOSIS — I70202 Unspecified atherosclerosis of native arteries of extremities, left leg: Secondary | ICD-10-CM | POA: Diagnosis present

## 2024-03-03 DIAGNOSIS — G629 Polyneuropathy, unspecified: Secondary | ICD-10-CM | POA: Diagnosis not present

## 2024-03-03 DIAGNOSIS — F330 Major depressive disorder, recurrent, mild: Secondary | ICD-10-CM | POA: Diagnosis not present

## 2024-03-03 DIAGNOSIS — G4710 Hypersomnia, unspecified: Secondary | ICD-10-CM | POA: Diagnosis present

## 2024-03-03 DIAGNOSIS — Z880 Allergy status to penicillin: Secondary | ICD-10-CM

## 2024-03-03 DIAGNOSIS — T402X5A Adverse effect of other opioids, initial encounter: Secondary | ICD-10-CM | POA: Diagnosis not present

## 2024-03-03 DIAGNOSIS — T43595A Adverse effect of other antipsychotics and neuroleptics, initial encounter: Secondary | ICD-10-CM | POA: Diagnosis not present

## 2024-03-03 DIAGNOSIS — S91302A Unspecified open wound, left foot, initial encounter: Secondary | ICD-10-CM | POA: Diagnosis not present

## 2024-03-03 DIAGNOSIS — Z7401 Bed confinement status: Secondary | ICD-10-CM | POA: Diagnosis not present

## 2024-03-03 LAB — CBC WITH DIFFERENTIAL/PLATELET
Abs Immature Granulocytes: 0.16 K/uL — ABNORMAL HIGH (ref 0.00–0.07)
Basophils Absolute: 0 K/uL (ref 0.0–0.1)
Basophils Absolute: 0.2 K/uL — ABNORMAL HIGH (ref 0.0–0.1)
Basophils Relative: 0.4 % (ref 0.0–3.0)
Basophils Relative: 1 %
Eosinophils Absolute: 0.1 K/uL (ref 0.0–0.7)
Eosinophils Absolute: 0.1 K/uL (ref 0.0–0.5)
Eosinophils Relative: 1.1 % (ref 0.0–5.0)
Eosinophils Relative: 1 %
HCT: 38.2 % (ref 36.0–46.0)
HCT: 40.7 % (ref 36.0–46.0)
Hemoglobin: 11.9 g/dL — ABNORMAL LOW (ref 12.0–15.0)
Hemoglobin: 12.5 g/dL (ref 12.0–15.0)
Immature Granulocytes: 1 %
Lymphocytes Relative: 21.8 % (ref 12.0–46.0)
Lymphocytes Relative: 19 %
Lymphs Abs: 2.7 K/uL (ref 0.7–4.0)
Lymphs Abs: 2.8 K/uL (ref 0.7–4.0)
MCH: 28.8 pg (ref 26.0–34.0)
MCHC: 31.1 g/dL (ref 30.0–36.0)
MCHC: 30.7 g/dL (ref 30.0–36.0)
MCV: 89.3 fl (ref 78.0–100.0)
MCV: 93.8 fL (ref 80.0–100.0)
Monocytes Absolute: 0.7 K/uL (ref 0.1–1.0)
Monocytes Absolute: 0.9 K/uL (ref 0.1–1.0)
Monocytes Relative: 5.8 % (ref 3.0–12.0)
Monocytes Relative: 6 %
Neutro Abs: 8.6 K/uL — ABNORMAL HIGH (ref 1.4–7.7)
Neutro Abs: 10.8 K/uL — ABNORMAL HIGH (ref 1.7–7.7)
Neutrophils Relative %: 70.9 % (ref 43.0–77.0)
Neutrophils Relative %: 72 %
Platelets: 388 K/uL (ref 150.0–400.0)
Platelets: 421 K/uL — ABNORMAL HIGH (ref 150–400)
RBC: 4.27 Mil/uL (ref 3.87–5.11)
RBC: 4.34 MIL/uL (ref 3.87–5.11)
RDW: 17.7 % — ABNORMAL HIGH (ref 11.5–15.5)
RDW: 16.2 % — ABNORMAL HIGH (ref 11.5–15.5)
WBC: 12.2 K/uL — ABNORMAL HIGH (ref 4.0–10.5)
WBC: 15 K/uL — ABNORMAL HIGH (ref 4.0–10.5)
nRBC: 0 % (ref 0.0–0.2)

## 2024-03-03 LAB — URINALYSIS, W/ REFLEX TO CULTURE (INFECTION SUSPECTED)
Bilirubin Urine: NEGATIVE
Glucose, UA: >=500 mg/dL — AB
Hgb urine dipstick: NEGATIVE
Ketones, ur: NEGATIVE mg/dL
Leukocytes,Ua: NEGATIVE
Nitrite: NEGATIVE
Protein, ur: NEGATIVE mg/dL
Specific Gravity, Urine: 1.023 (ref 1.005–1.030)
pH: 5 (ref 5.0–8.0)

## 2024-03-03 LAB — COMPREHENSIVE METABOLIC PANEL WITH GFR
ALT: 14 U/L (ref 0–44)
AST: 18 U/L (ref 15–41)
Albumin: 2.9 g/dL — ABNORMAL LOW (ref 3.5–5.0)
Alkaline Phosphatase: 165 U/L — ABNORMAL HIGH (ref 38–126)
Anion gap: 11 (ref 5–15)
BUN: 11 mg/dL (ref 8–23)
CO2: 22 mmol/L (ref 22–32)
Calcium: 8.6 mg/dL — ABNORMAL LOW (ref 8.9–10.3)
Chloride: 105 mmol/L (ref 98–111)
Creatinine, Ser: 1.08 mg/dL — ABNORMAL HIGH (ref 0.44–1.00)
GFR, Estimated: 53 mL/min — ABNORMAL LOW (ref >60)
Glucose, Bld: 271 mg/dL — ABNORMAL HIGH (ref 70–99)
Potassium: 3.8 mmol/L (ref 3.5–5.1)
Sodium: 138 mmol/L (ref 135–145)
Total Bilirubin: 0.7 mg/dL (ref 0.0–1.2)
Total Protein: 6.9 g/dL (ref 6.5–8.1)

## 2024-03-03 LAB — BASIC METABOLIC PANEL WITH GFR
BUN: 10 mg/dL (ref 6–23)
CO2: 25 meq/L (ref 19–32)
Calcium: 8.4 mg/dL (ref 8.4–10.5)
Chloride: 103 meq/L (ref 96–112)
Creatinine, Ser: 1.01 mg/dL (ref 0.40–1.20)
GFR: 53.35 mL/min — ABNORMAL LOW (ref >60.00)
Glucose, Bld: 192 mg/dL — ABNORMAL HIGH (ref 70–99)
Potassium: 3.9 meq/L (ref 3.5–5.1)
Sodium: 140 meq/L (ref 135–145)

## 2024-03-03 LAB — LACTIC ACID, PLASMA
Lactic Acid, Venous: 2 mmol/L (ref 0.5–1.9)
Lactic Acid, Venous: 2.1 mmol/L (ref 0.5–1.9)

## 2024-03-03 LAB — GLUCOSE, CAPILLARY: Glucose-Capillary: 93 mg/dL (ref 70–99)

## 2024-03-03 LAB — CBG MONITORING, ED: Glucose-Capillary: 125 mg/dL — ABNORMAL HIGH (ref 70–99)

## 2024-03-03 MED ORDER — PANTOPRAZOLE SODIUM 40 MG PO TBEC
40.0000 mg | DELAYED_RELEASE_TABLET | Freq: Every day | ORAL | Status: DC
  Administered 2024-03-04 – 2024-03-16 (×14): 40 mg via ORAL
  Filled 2024-03-03 (×13): qty 1

## 2024-03-03 MED ORDER — VANCOMYCIN HCL IN DEXTROSE 1-5 GM/200ML-% IV SOLN: 1000.0000 mg | Freq: Once | INTRAVENOUS | Status: DC

## 2024-03-03 MED ORDER — SODIUM CHLORIDE 0.9 % IV BOLUS
1000.0000 mL | Freq: Once | INTRAVENOUS | Status: AC
  Administered 2024-03-03: 1000 mL via INTRAVENOUS

## 2024-03-03 MED ORDER — INSULIN GLARGINE 100 UNIT/ML ~~LOC~~ SOLN
25.0000 [IU] | Freq: Every day | SUBCUTANEOUS | Status: DC
  Administered 2024-03-03 – 2024-03-05 (×3): 25 [IU] via SUBCUTANEOUS
  Filled 2024-03-03 (×4): qty 0.25

## 2024-03-03 MED ORDER — DAPAGLIFLOZIN PROPANEDIOL 10 MG PO TABS
10.0000 mg | ORAL_TABLET | Freq: Every day | ORAL | Status: DC
  Administered 2024-03-04 – 2024-03-16 (×13): 10 mg via ORAL
  Filled 2024-03-03 (×13): qty 1

## 2024-03-03 MED ORDER — COLCHICINE 0.6 MG PO TABS
0.6000 mg | ORAL_TABLET | Freq: Two times a day (BID) | ORAL | Status: DC
  Filled 2024-03-03: qty 1

## 2024-03-03 MED ORDER — INSULIN ASPART 100 UNIT/ML IJ SOLN
0.0000 [IU] | Freq: Three times a day (TID) | INTRAMUSCULAR | Status: DC
  Administered 2024-03-04: 3 [IU] via SUBCUTANEOUS
  Administered 2024-03-06 (×2): 2 [IU] via SUBCUTANEOUS
  Administered 2024-03-07: 5 [IU] via SUBCUTANEOUS
  Administered 2024-03-07: 2 [IU] via SUBCUTANEOUS
  Administered 2024-03-08: 5 [IU] via SUBCUTANEOUS
  Administered 2024-03-10 (×2): 3 [IU] via SUBCUTANEOUS
  Administered 2024-03-11: 2 [IU] via SUBCUTANEOUS
  Administered 2024-03-11 – 2024-03-12 (×4): 3 [IU] via SUBCUTANEOUS
  Administered 2024-03-13: 5 [IU] via SUBCUTANEOUS
  Administered 2024-03-13 – 2024-03-14 (×3): 2 [IU] via SUBCUTANEOUS
  Administered 2024-03-14: 3 [IU] via SUBCUTANEOUS
  Administered 2024-03-14 – 2024-03-15 (×2): 2 [IU] via SUBCUTANEOUS
  Administered 2024-03-15: 3 [IU] via SUBCUTANEOUS
  Administered 2024-03-15: 2 [IU] via SUBCUTANEOUS
  Administered 2024-03-16: 3 [IU] via SUBCUTANEOUS
  Filled 2024-03-03 (×23): qty 1

## 2024-03-03 MED ORDER — FINERENONE 10 MG PO TABS
10.0000 mg | ORAL_TABLET | Freq: Every morning | ORAL | Status: DC
  Administered 2024-03-04 – 2024-03-08 (×4): 10 mg via ORAL
  Filled 2024-03-03 (×6): qty 1

## 2024-03-03 MED ORDER — ONDANSETRON HCL 4 MG/2ML IJ SOLN
4.0000 mg | Freq: Four times a day (QID) | INTRAMUSCULAR | Status: AC | PRN
  Filled 2024-03-03: qty 2

## 2024-03-03 MED ORDER — NYSTATIN 100000 UNIT/GM EX POWD: 1.0000 g | Freq: Four times a day (QID) | CUTANEOUS | Status: DC | PRN

## 2024-03-03 MED ORDER — INSULIN ASPART 100 UNIT/ML IJ SOLN
0.0000 [IU] | Freq: Every day | INTRAMUSCULAR | Status: DC
  Administered 2024-03-15: 2 [IU] via SUBCUTANEOUS
  Filled 2024-03-03 (×2): qty 1

## 2024-03-03 MED ORDER — TRAMADOL HCL 50 MG PO TABS
50.0000 mg | ORAL_TABLET | Freq: Four times a day (QID) | ORAL | Status: DC | PRN
  Administered 2024-03-03 – 2024-03-07 (×6): 50 mg via ORAL
  Filled 2024-03-03 (×6): qty 1

## 2024-03-03 MED ORDER — EZETIMIBE 10 MG PO TABS
10.0000 mg | ORAL_TABLET | Freq: Every day | ORAL | Status: DC
  Administered 2024-03-03 – 2024-03-15 (×13): 10 mg via ORAL
  Filled 2024-03-03 (×14): qty 1

## 2024-03-03 MED ORDER — SENNOSIDES-DOCUSATE SODIUM 8.6-50 MG PO TABS
1.0000 | ORAL_TABLET | Freq: Every evening | ORAL | Status: DC | PRN
  Administered 2024-03-10 (×2): 1 via ORAL
  Filled 2024-03-03 (×2): qty 1

## 2024-03-03 MED ORDER — ACETAMINOPHEN 650 MG RE SUPP: 650.0000 mg | Freq: Four times a day (QID) | RECTAL | Status: AC | PRN

## 2024-03-03 MED ORDER — NITROGLYCERIN 0.4 MG SL SUBL: 0.4000 mg | SUBLINGUAL_TABLET | SUBLINGUAL | Status: DC | PRN

## 2024-03-03 MED ORDER — FLUTICASONE FUROATE-VILANTEROL 200-25 MCG/ACT IN AEPB
1.0000 | INHALATION_SPRAY | Freq: Every day | RESPIRATORY_TRACT | Status: DC
  Administered 2024-03-04 – 2024-03-16 (×9): 1 via RESPIRATORY_TRACT
  Filled 2024-03-03 (×3): qty 28

## 2024-03-03 MED ORDER — TORSEMIDE 20 MG PO TABS
20.0000 mg | ORAL_TABLET | Freq: Every day | ORAL | Status: DC
  Administered 2024-03-04 – 2024-03-07 (×3): 20 mg via ORAL
  Filled 2024-03-03 (×4): qty 1

## 2024-03-03 MED ORDER — PREGABALIN 50 MG PO CAPS
50.0000 mg | ORAL_CAPSULE | Freq: Two times a day (BID) | ORAL | Status: DC
  Administered 2024-03-03 – 2024-03-10 (×14): 50 mg via ORAL
  Filled 2024-03-03 (×14): qty 1

## 2024-03-03 MED ORDER — VANCOMYCIN HCL 1000 MG/200ML IV SOLN
1000.0000 mg | INTRAVENOUS | Status: DC
  Administered 2024-03-04: 1000 mg via INTRAVENOUS
  Filled 2024-03-03: qty 200

## 2024-03-03 MED ORDER — LACTATED RINGERS IV SOLN: INTRAVENOUS | Status: DC

## 2024-03-03 MED ORDER — FLUTICASONE PROPIONATE 50 MCG/ACT NA SUSP: 2.0000 | Freq: Every day | NASAL | Status: DC | PRN

## 2024-03-03 MED ORDER — APIXABAN 5 MG PO TABS
5.0000 mg | ORAL_TABLET | Freq: Two times a day (BID) | ORAL | Status: DC
  Administered 2024-03-03 – 2024-03-06 (×6): 5 mg via ORAL
  Filled 2024-03-03 (×6): qty 1

## 2024-03-03 MED ORDER — DULOXETINE HCL 30 MG PO CPEP
60.0000 mg | ORAL_CAPSULE | Freq: Every day | ORAL | Status: DC
  Administered 2024-03-04 – 2024-03-10 (×7): 60 mg via ORAL
  Filled 2024-03-03 (×7): qty 2

## 2024-03-03 MED ORDER — ATORVASTATIN CALCIUM 20 MG PO TABS
80.0000 mg | ORAL_TABLET | Freq: Every day | ORAL | Status: DC
  Administered 2024-03-03 – 2024-03-15 (×13): 80 mg via ORAL
  Filled 2024-03-03 (×13): qty 4

## 2024-03-03 MED ORDER — ACETAMINOPHEN 325 MG PO TABS
650.0000 mg | ORAL_TABLET | Freq: Four times a day (QID) | ORAL | Status: AC | PRN
  Administered 2024-03-04 – 2024-03-06 (×4): 650 mg via ORAL
  Filled 2024-03-03 (×4): qty 2

## 2024-03-03 MED ORDER — IPRATROPIUM-ALBUTEROL 0.5-2.5 (3) MG/3ML IN SOLN: 3.0000 mL | Freq: Four times a day (QID) | RESPIRATORY_TRACT | Status: AC | PRN

## 2024-03-03 MED ORDER — LIDOCAINE 5 % EX PTCH: 1.0000 | MEDICATED_PATCH | Freq: Every day | CUTANEOUS | Status: DC | PRN

## 2024-03-03 MED ORDER — ALLOPURINOL 100 MG PO TABS
100.0000 mg | ORAL_TABLET | Freq: Every day | ORAL | Status: DC
  Administered 2024-03-04 – 2024-03-16 (×14): 100 mg via ORAL
  Filled 2024-03-03 (×13): qty 1

## 2024-03-03 MED ORDER — ONDANSETRON HCL 4 MG PO TABS: 4.0000 mg | ORAL_TABLET | Freq: Four times a day (QID) | ORAL | Status: AC | PRN

## 2024-03-03 MED ORDER — TIZANIDINE HCL 4 MG PO TABS
4.0000 mg | ORAL_TABLET | Freq: Two times a day (BID) | ORAL | Status: DC
  Administered 2024-03-03 – 2024-03-08 (×9): 4 mg via ORAL
  Filled 2024-03-03 (×11): qty 1

## 2024-03-03 MED ORDER — ALLOPURINOL 100 MG PO TABS: 100.0000 mg | ORAL_TABLET | Freq: Every day | ORAL | Status: DC

## 2024-03-03 MED ORDER — HYDRALAZINE HCL 20 MG/ML IJ SOLN: 5.0000 mg | Freq: Four times a day (QID) | INTRAMUSCULAR | Status: AC | PRN

## 2024-03-03 MED ORDER — VANCOMYCIN HCL 2000 MG/400ML IV SOLN
2000.0000 mg | Freq: Once | INTRAVENOUS | Status: AC
  Administered 2024-03-03: 2000 mg via INTRAVENOUS
  Filled 2024-03-03 (×2): qty 400

## 2024-03-03 NOTE — Assessment & Plan Note (Signed)
Home apixaban 5 mg p.o. twice daily resumed

## 2024-03-03 NOTE — Assessment & Plan Note (Signed)
 Home atorvastatin 80 mg nightly resumed

## 2024-03-03 NOTE — ED Triage Notes (Signed)
 Pt to ed from home via POV for possible cellulitis. Pt was just released from hospital and nursing home within the last week for same. Pt is caox4, in no acute distress in triage. Pt has redness and heat on left hand.

## 2024-03-03 NOTE — Assessment & Plan Note (Signed)
 CPAP nightly ordered

## 2024-03-03 NOTE — Consult Note (Signed)
 Pharmacy Antibiotic Note  Denise Sharp is a 78 y.o. female admitted on 03/03/2024 with left foot infection.  Pharmacy has been consulted for vancomycin  dosing.  Plan: Give vancomycin  2000 mg IV x 1, then start vancomycin  1000 mg IV every 24 hours Estimated AUC 540, Cmin 14.9 IBW, Scr 1.08, Vd 0.5 (BMI 39.7) Vancomycin  levels at steady state or as clinically indicated Follow renal function and cultures for adjustments  Height: 5' 4 (162.6 cm) IBW/kg (Calculated) : 54.7  Temp (24hrs), Avg:98.5 F (36.9 C), Min:98.5 F (36.9 C), Max:98.5 F (36.9 C)  Recent Labs  Lab 03/03/24 1111 03/03/24 1124 03/03/24 1452  WBC  --  12.2* 15.0*  CREATININE 1.01  --  1.08*  LATICACIDVEN  --   --  2.0*    Estimated Creatinine Clearance: 50.8 mL/min (A) (by C-G formula based on SCr of 1.08 mg/dL (H)).    Allergies  Allergen Reactions   Amoxicillin  Diarrhea    amoxicillin    Clarithromycin  Nausea And Vomiting and Other (See Comments)    Severe irritable bowel  clarithromycin    Demeclocycline Hives   Erythromycin Nausea And Vomiting and Other (See Comments)    Severe irritable bowel   Flagyl [Metronidazole] Nausea And Vomiting and Other (See Comments)    Severe irritable bowel   Glucophage [Metformin Hcl] Nausea And Vomiting and Other (See Comments)    Sick I won't take anything that has metformin in it   Tetracyclines & Related Hives and Rash   Potassium Chloride  Other (See Comments)    Cannot swallow potssium pills, able to tolerate liquid potassium   Tetracycline Other (See Comments)   Diovan [Valsartan] Nausea Only        Sulfa  Antibiotics Other (See Comments) and Rash    As child   Xanax [Alprazolam] Other (See Comments)    Hyperactivity     Antimicrobials this admission: Vancomycin  8/19 >>    Dose adjustments this admission: N/A  Microbiology results: 8/19 BCx: pending   Thank you for allowing pharmacy to be a part of this patient's care.  Kayla JULIANNA Blew, PharmD, BCPS 03/03/2024 5:33 PM

## 2024-03-03 NOTE — Assessment & Plan Note (Signed)
 Fall precaution, PT, OT

## 2024-03-03 NOTE — ED Provider Notes (Signed)
 [facility] Provider Note    Event Date/Time   First MD Initiated Contact with Patient 03/03/24 1614     (approximate)  History   Chief Complaint: Wound Infection  HPI  LARENDA REEDY is a 78 y.o. female with a past medical history of anxiety, asthma, CAD, CKD hypertension, hyperlipidemia, presents to the emergency department for worsening left foot infection and now redness and swelling of her left hand.  Patient states she was recently discharged in the hospital for similar issue but the foot wound has worsened and is now draining and she was spiking fevers to 100.5 over the weekend.  I reviewed the patient's chart she was discharged 02/03/2024 after admission for cellulitis of the left foot.  Patient states she finished antibiotics at her rehab facility has been home for the past week, states she saw her doctor this morning who sent them to the emergency department given the worsening wound and now redness swelling of the left hand.  Physical Exam   Triage Vital Signs: ED Triage Vitals  Encounter Vitals Group     BP 03/03/24 1448 (!) 106/46     Girls Systolic BP Percentile --      Girls Diastolic BP Percentile --      Boys Systolic BP Percentile --      Boys Diastolic BP Percentile --      Pulse Rate 03/03/24 1448 82     Resp 03/03/24 1448 16     Temp 03/03/24 1448 98.5 F (36.9 C)     Temp Source 03/03/24 1448 Oral     SpO2 03/03/24 1448 98 %     Weight --      Height 03/03/24 1449 5' 4 (1.626 m)     Head Circumference --      Peak Flow --      Pain Score 03/03/24 1449 0     Pain Loc --      Pain Education --      Exclude from Growth Chart --     Most recent vital signs: Vitals:   03/03/24 1448  BP: (!) 106/46  Pulse: 82  Resp: 16  Temp: 98.5 F (36.9 C)  SpO2: 98%    General: Awake, no distress.  CV:  Good peripheral perfusion.  Regular rate and rhythm  Resp:  Normal effort.  Equal breath sounds bilaterally.  Abd:  No  distention.  Soft, nontender.   Other:  Patient has a wound/ulceration to the lateral aspect of the left foot measuring approximately 5 to 6 cm in diameter.  Mild drainage.  Patient has redness and tenderness and mild swelling to the dorsal aspect of the left hand which could represent cellulitis versus gout.   ED Results / Procedures / Treatments   MEDICATIONS ORDERED IN ED: Medications  vancomycin  (VANCOCIN ) IVPB 1000 mg/200 mL premix (has no administration in time range)     IMPRESSION / MDM / ASSESSMENT AND PLAN / ED COURSE  I reviewed the triage vital signs and the nursing notes.  Patient's presentation is most consistent with acute presentation with potential threat to life or bodily function.  Patient presents the emergency department for worsening wound to her left foot and now a wound to the dorsal aspect of the left hand.  Patient states she has been feeling more weak fatigued and nauseated.  States she had fever to 100.5 over the weekend she saw her doctor today who sent her to the emergency department for evaluation.  Patient's lab work today shows worsening leukocytosis with a white blood cell count of 15,000 increased from 10,000 on discharge.  Patient's lactic acid is elevated to 2.0 chemistry is reassuring besides mild hyperglycemia.  Given the patient's worsening wound infection now with reported fever at home worsening leukocytosis and elevated lactic acidosis we will admit to the hospitalist service.  Will start the patient on IV vancomycin  and send blood cultures and IV hydrate given Mild hyperglycemia.  Patient agreeable to plan of care.  FINAL CLINICAL IMPRESSION(S) / ED DIAGNOSES   Cellulitis    Note:  This document was prepared using Dragon voice recognition software and may include unintentional dictation errors.   Dorothyann Drivers, MD 03/03/24 [id number]

## 2024-03-03 NOTE — Progress Notes (Signed)
 PTNS  Session # 3  Health & Social Factors: New draining left foot wound, headed to ED this afternoon. Caffeine : 1 Alcohol : 0 Daytime voids #per day: 3 Night-time voids #per night: 1-2 Urgency: mild Incontinence Episodes #per day: 4 Ankle used: left Treatment Setting: 19 Feeling/ Response: sensory Comments: Patient tolerated well.  Performed By: Albeiro Trompeter, PA-C   Follow Up: 1 week

## 2024-03-03 NOTE — Assessment & Plan Note (Signed)
 -  This complicates overall care and prognosis.

## 2024-03-03 NOTE — Telephone Encounter (Signed)
 LMTCB. Need to let pt know that Dr. Tullo is adding on stool studies for her to collect and she will pick up the kits when she comes in for labs today. Also need to let her know that she will need to be evaluated for the hand pain by one of our providers either here or at [facility].

## 2024-03-03 NOTE — Patient Instructions (Signed)
 Tracking Your Bladder Symptoms   Patient Name:___________________________________________________  Example: Day   Daytime Voids  Nighttime Voids Urgency for the Day (none, mild, strong, severe) Number of Accidents/ Leaks Beverage Comments  Monday IIII II Strong I Water  IIII Coffee  I     Week Starting:____________________________________  Day Daytime  Voids Nighttime  Voids Urgency for the Day (none, mild, strong, severe) Number of Accidents/ Leaks Beverages Comments                                                           This week my symptoms were:  O much better O better O the same O worse

## 2024-03-03 NOTE — Addendum Note (Signed)
 Addended by: MARYLYNN VERNEITA CROME on: 03/03/2024 08:58 AM   Modules accepted: Orders

## 2024-03-03 NOTE — Assessment & Plan Note (Signed)
 Home duloxetine 60 mg daily resumed

## 2024-03-03 NOTE — ED Notes (Signed)
 Patients bed is ready. I let nurse Rosina Rogue know

## 2024-03-03 NOTE — Hospital Course (Signed)
 [name] is a 78 year old with hypertension, hyperlipidemia, CAD, anxiety, asthma, CKD, who presents from urology for chief concern of worsening wounds of the left foot.  Patient reports that over the weekend, she has been having fever of 100.5.  Vitals in the ED showed t of 98.5, rr 16, heart rate 82, blood pressure 106/46, SpO2 of 98% on room air.  Serum sodium is 138, potassium 3.8, chloride 105, bicarb 22, BUN of 11, serum creatinine 1.08, EGFR 53, nonfasting blood glucose 271, WBC 15, hemoglobin 12.5, platelet 421.  Alk phos was 165. Lactic acid 2.0.  ED treatment: Sodium chloride  1 L bolus, vancomycin  per pharmacy.

## 2024-03-03 NOTE — Assessment & Plan Note (Signed)
 Home long acting insulin , degludec 25 units subcutaneous daily resumed Insulin  SSI with HS coverage Goal inpatient blood glucose level 140-180

## 2024-03-03 NOTE — Assessment & Plan Note (Signed)
 Patient reports that she gets iron  infusion Outpatient follow-up as above.

## 2024-03-03 NOTE — Assessment & Plan Note (Signed)
 I received message from pharmacy stating that patient endorsed to pharmacy tech that she is no longer taking allopurinol  RN messaged me stating that patient does take allopurinol  and that she does not take colchicine  twice a day only as needed Outpatient 01/24/2024 Rheumatology note reviewed: Allopurinol  100 mg daily resumed, continue colchicine  as needed for inflammation Allopurinol  100 mg daily resume; discontinue scheduled colchicine 

## 2024-03-03 NOTE — Telephone Encounter (Signed)
 Pt came into the office this morning for lab work and asked if she could be worked in today with Dr. Marylynn. Dr. Marylynn did not have any availability so pt was advised that it would be best for her to go on to the ED based on the symptoms she was having. Left hand pain, swelling, redness, and warmth. Pt also stated that she had just recently been admitted to the hospital for cellulitis of the left hand. She stated that she also has a sore on the heel of her left foot that is not healing. Pt is diabetic. Pt agreed to go to the ED today after her urology appt. Pt is currently at the ED.

## 2024-03-03 NOTE — Assessment & Plan Note (Signed)
 PDMP reviewed.

## 2024-03-03 NOTE — Assessment & Plan Note (Signed)
 Cpap at bedtime ordered

## 2024-03-03 NOTE — Assessment & Plan Note (Addendum)
  Vancomycin  per pharmacy Blood cultures x 2 are in process X-ray of the left foot ordered on admission If needed, patient will need further imaging, CT imaging of the leg/foot

## 2024-03-03 NOTE — Telephone Encounter (Signed)
 I will have to have pt sign a medical record release form.

## 2024-03-03 NOTE — Telephone Encounter (Signed)
 noted

## 2024-03-03 NOTE — H&P (Addendum)
 History and Physical   Denise Sharp FMW:[id number] DOB: [birth year] DOA: 03/03/2024  PCP: Marylynn Verneita CROME, MD  Patient coming from: Urology clinics  I have personally briefly reviewed patient's old medical records in [facility] EMR.  Chief Concern: Left foot wound  HPI: Denise Sharp is a 78 year old with hypertension, hyperlipidemia, CAD, anxiety, asthma, CKD, who presents from urology for chief concern of worsening wounds of the left foot.  Patient reports that over the weekend, she has been having fever of 100.5.  Vitals in the ED showed t of 98.5, rr 16, heart rate 82, blood pressure 106/46, SpO2 of 98% on room air.  Serum sodium is 138, potassium 3.8, chloride 105, bicarb 22, BUN of 11, serum creatinine 1.08, EGFR 53, nonfasting blood glucose 271, WBC 15, hemoglobin 12.5, platelet 421.  Alk phos was 165. Lactic acid 2.0.  ED treatment: Sodium chloride  1 L bolus, vancomycin  per pharmacy. ------------------------------------- At bedside, patient able to tell me her first and last name, age, location, current calendar year.  She reports her left foot never really got healed.  She reports fever of 100.5 both Saturday and Sunday.  She reports she took Tylenol  with relief.  She reports she completed oral antibiotic for urinary tract infection on Friday.  She endorses chills, and increasing pain with discharge of her left foot.  She denies abdominal pain, changes to baseline chest discomfort and shortness of breath.  She denies dysuria, hematuria, diarrhea.  She reports that dysuria symptom has improved significantly.  She denies blood in her stool.  Social history: She lives at home with her husband.  She denies tobacco, EtOH, recreational drug use.  She is retired.  ROS: Constitutional: no weight change, + fever ENT/Mouth: no sore throat, no rhinorrhea Eyes: no eye pain, no vision changes Cardiovascular: no chest pain, no dyspnea,  no edema, no palpitations Respiratory:  no cough, no sputum, no wheezing Gastrointestinal: no nausea, no vomiting, no diarrhea, no constipation Genitourinary: no urinary incontinence, no dysuria, no hematuria Musculoskeletal: no arthralgias, no myalgias Skin: + Left foot lesions, no pruritus, Neuro: + weakness, no loss of consciousness, no syncope Psych: no anxiety, no depression, no decrease appetite Heme/Lymph: no bruising, no bleeding  ED Course: Discussed with EDP, patient requiring hospitalization for chief concerns of cellulitis.  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)   Major depressive disorder with current active episode   S/P cardiac catheterization   Hyperlipidemia   Chronic anxiety   Gouty arthritis of hand   Charcot joint of left foot   Weakness   GAD (generalized anxiety disorder)   History of gout   Assessment and Plan:  * Cellulitis of left foot  Vancomycin  per pharmacy Blood cultures x 2 are in process X-ray of the left foot ordered on admission If needed, patient will need further imaging, CT imaging of the leg/foot  SIRS (systemic inflammatory response syndrome) (HCC) Patient had fever of 100.5, leukocytosis of 15 Lactic acid elevated at 2.0,  source is likely left lower extremity cellulitis Blood cultures x 2 are in process UA has been ordered pending collection Will follow-up with second lactic acid Continue with vancomycin  per pharmacy Sodium chloride  1 L bolus completed by EDP;  LR infusion 125 ml/hr, 1 day ordered  Persistent atrial fibrillation (HCC) Home apixaban  5 mg p.o. twice daily resumed  Obesity hypoventilation syndrome (HCC) Cpap at bedtime ordered  Iron  deficiency anemia Patient reports that she gets iron  infusion Outpatient follow-up as above.  OSA on CPAP CPAP nightly ordered  Type II diabetes mellitus with neurological manifestations (HCC) Home long acting insulin , degludec 25 units subcutaneous daily resumed Insulin  SSI with HS coverage Goal inpatient blood glucose level 140-180  Chronic anxiety Home duloxetine  60 mg daily resumed  Hyperlipidemia Home atorvastatin  80 mg nightly resumed  Major depressive disorder with current active episode Home duloxetine  60 mg daily resumed  Morbid obesity (HCC) This complicates overall care and prognosis.   History of gout I received message from pharmacy stating that patient endorsed to pharmacy tech that she is no longer taking allopurinol  RN messaged me stating that patient does take allopurinol  and that she does not take colchicine  twice a day only as needed Outpatient 01/24/2024 Rheumatology note reviewed: Allopurinol  100 mg daily resumed, continue colchicine  as needed for inflammation Allopurinol  100 mg daily resume; discontinue scheduled colchicine   GAD (generalized anxiety disorder) PDMP reviewed  Weakness Fall precaution, PT, OT  Chart reviewed.   DVT prophylaxis: Eliquis  5 mg p.o. twice daily Code Status: Full code Diet: Heart healthy/carb modified Family Communication: No Disposition Plan: Pending clinical course Consults called: Pharmacy, PT, OT Admission status: Inpatient telemetry medical  Past Medical History:  Diagnosis Date   (HFpEF) heart failure with preserved ejection fraction (HCC)    a. 12/2019 Echo: EF 65-70%, Gr2 DD. No significant valvular dzs.   Abnormality of gait 03/25/2013   Adrenal mass, left (HCC)    Anginal pain (HCC)    Anxiety    Aortic  atherosclerosis (HCC)    Arthritis    Asthma    CAD (coronary artery disease) with h/o Atypical Chest Pain    a. 04/1986 Cath ([facility]): nl cors, EF 65%; b. 07/2012 Cath [facility]): Diff minor irregs; c. 07/2016 MV [facility]): Equivocal; d. 08/2016 Cardiac CT Ca2+ score [facility]): Ca2+ 1548; e. 05/2019 [facility]: No ischemia. EF 75%; f. 12/2019 PCI: LM nl, LAD 65ost (3.5x12 Resolute DES), 43m (2.75x12 Resolute DES),LCX/RCA nl; g. 08/2020 Cath: patent LAD stents, RCA 40ost, elev RH pressures, EF >65%.   Cervical spinal stenosis 1994   due to trauma to back ([employer] accident), has intermittent paralysis and parasthesias   Cervicogenic headache 03/23/2014   CKD (chronic kidney disease), stage III (HCC)    Depression    Diverticulosis    Dizziness    a.) chronic   DJD (degenerative joint disease)    a. Chronic R shoulder pain   Dyspnea    Esophageal stenosis 09/2009   a.) transient outlet obstruction by food, cleared by EGD   Family history of adverse reaction to anesthesia    a.) daughter with (+) PONV   Gastric bypass status for obesity    GERD (gastroesophageal reflux disease)    Gout    Headache(784.0)    HLD (hyperlipidemia)    Hypertension    IBS (irritable bowel syndrome)    IDA (iron  deficiency anemia)    a.) post 2 unit txfsn 2009, normal endo/colonoscopy by [facility]   Left bundle branch block (LBBB)  a.) Intermittently present - likely rate related.   NSVT (nonsustained ventricular tachycardia) (HCC) 11/15/2020   a.) Holter study 11/15/2020; 11 episodes of NSVT lasting up to 8 beats with a maximum rate of 210 bpm; 14 atrial runs lasting up to 18 beats with a rate of up to 250 bpm.  Some atrial runs felt to be NSVT.   NSVT (nonsustained ventricular tachycardia) (HCC)    a. 10/2020 [facility]: 11 runs of NSVT up to 8 beats.   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 10/2020 [facility]: 14 atrial runs up to 18 beats, max HR 250.   Restless leg syndrome    Rotator cuff arthropathy, right 08/13/2017   Syncope and collapse 03/12/2014   Type II diabetes mellitus (HCC)    Past Surgical History:  Procedure Laterality Date   APPENDECTOMY     BACK SURGERY     CARDIOVASCULAR STRESS TEST     CARDIOVERSION N/A 10/30/2022   Procedure: CARDIOVERSION;  Surgeon: Mady Bruckner, MD;  Location: [facility] ORS;  Service: Cardiovascular;  Laterality: N/A;   CHOLECYSTECTOMY N/A 1976   COLONOSCOPY     CORONARY ANGIOPLASTY     CORONARY STENT INTERVENTION N/A 01/04/2020   Procedure: CORONARY STENT INTERVENTION;  Surgeon: Darron Deatrice LABOR, MD;  Location: [facility] INVASIVE CV LAB;  Service: Cardiovascular;  Laterality: N/A;  LAD    DIAPHRAGMATIC HERNIA REPAIR  2015   ESOPHAGEAL DILATION     multiple   ESOPHAGOGASTRODUODENOSCOPY (EGD) WITH PROPOFOL  N/A 05/11/2021   Procedure: ESOPHAGOGASTRODUODENOSCOPY (EGD) WITH PROPOFOL ;  Surgeon: Jinny Carmine, MD;  Location: [facility] END[facility];  Service: Endoscopy;  Laterality: N/A;   ESOPHAGOGASTRODUODENOSCOPY (EGD) WITH PROPOFOL  N/A 06/13/2021   Procedure: ESOPHAGOGASTRODUODENOSCOPY (EGD) WITH PROPOFOL ;  Surgeon: Jinny Carmine, MD;  Location: [facility];  Service: Endoscopy;  Laterality: N/A;   ESOPHAGOGASTRODUODENOSCOPY (EGD) WITH PROPOFOL  N/A 02/28/2023   Procedure: ESOPHAGOGASTRODUODENOSCOPY (EGD) WITH PROPOFOL ;  Surgeon: Jinny Carmine, MD;  Location: [facility];  Service: Endoscopy;  Laterality: N/A;   EYE SURGERY     GASTRIC BYPASS  2000, 2005   Dr. Mia REDBIRD IMPLANT PLACEMENT  10/2011   Ike REDBIRD IMPLANT PLACEMENT N/A 09/18/2021   Procedure: REDBIRD IMPLANT FIRST STAGE;  Surgeon: Gaston Hamilton, MD;  Location: [facility];  Service: Urology;  Laterality: N/A;   INTERSTIM IMPLANT PLACEMENT N/A 09/18/2021   Procedure: REDBIRD IMPLANT SECOND STAGE WITH IMPEDENCE CHECK;  Surgeon: Gaston Hamilton, MD;   Location: [facility];  Service: Urology;  Laterality: N/A;   INTERSTIM IMPLANT REMOVAL N/A 09/18/2021   Procedure: REMOVAL OF INTERSTIM IMPLANT;  Surgeon: Gaston Hamilton, MD;  Location: [facility];  Service: Urology;  Laterality: N/A;   LEFT HEART CATH AND CORONARY ANGIOGRAPHY N/A 04/1986   LEFT HEART CATH AND CORONARY ANGIOGRAPHY Left 07/24/2012   LEFT HEART CATH AND CORS/GRAFTS ANGIOGRAPHY N/A 12/31/2019   Procedure: LEFT HEART CATH AND CORS/GRAFTS ANGIOGRAPHY;  Surgeon: Perla Evalene PARAS, MD;  Location: [facility];  Service: Cardiovascular;  Laterality: N/A;   PANNICULECTOMY N/A 06/16/2019   Procedure: PANNICULECTOMY;  Surgeon: Elisabeth Craig RAMAN, MD;  Location: [facility];  Service: Plastics;  Laterality: N/A;  3 hours, please   REVERSE SHOULDER ARTHROPLASTY Right 08/13/2017   Procedure: REVERSE RIGHT SHOULDER ARTHROPLASTY;  Surgeon: Josefina Chew, MD;  Location: [facility];  Service: Orthopedics;  Laterality: Right;  RIGHT/LEFT HEART CATH AND CORONARY ANGIOGRAPHY N/A 09/06/2020   Procedure: RIGHT/LEFT HEART CATH AND CORONARY ANGIOGRAPHY;  Surgeon: Mady Bruckner, MD;  Location: [facility];  Service: Cardiovascular;  Laterality: N/A;   RIGHT/LEFT HEART CATH AND CORONARY ANGIOGRAPHY N/A 06/04/2022   Procedure: RIGHT/LEFT HEART CATH AND CORONARY ANGIOGRAPHY;  Surgeon: Mady Bruckner, MD;  Location: [facility];  Service: Cardiovascular;  Laterality: N/A;   ROTATOR CUFF REPAIR Right    SPINE SURGERY  1995   [place]   TOOTH EXTRACTION  11/13/2021   TOTAL ABDOMINAL HYSTERECTOMY W/ BILATERAL SALPINGOOPHORECTOMY  1974   TOTAL KNEE ARTHROPLASTY Bilateral 2007   Procedure: TOTAL KNEE ARTHROPLASTY; Surgeons: Kathi, MD and [name], MD   UMBILICAL HERNIA REPAIR  08/11/2015   Social History:  reports that she has never smoked. She has never been exposed to tobacco smoke. She has never used smokeless tobacco. She reports that she does not currently use drugs. She reports that she does  not drink alcohol .  Allergies  Allergen Reactions   Amoxicillin  Diarrhea    amoxicillin    Clarithromycin  Nausea And Vomiting and Other (See Comments)    Severe irritable bowel  clarithromycin    Demeclocycline Hives   Erythromycin Nausea And Vomiting and Other (See Comments)    Severe irritable bowel   Flagyl [Metronidazole] Nausea And Vomiting and Other (See Comments)    Severe irritable bowel   Glucophage [Metformin Hcl] Nausea And Vomiting and Other (See Comments)    Sick I won't take anything that has metformin in it   Tetracyclines & Related Hives and Rash   Potassium Chloride  Other (See Comments)    Cannot swallow potssium pills, able to tolerate liquid potassium   Tetracycline Other (See Comments)   Diovan [Valsartan] Nausea Only        Sulfa  Antibiotics Other (See Comments) and Rash    As child   Xanax [Alprazolam] Other (See Comments)    Hyperactivity    Family History  Problem Relation Age of Onset   Heart disease Father    Hypertension Father    Prostate cancer Father    Stroke Father    Osteoporosis Father    Stroke Mother    Depression Mother    Headache Mother    Heart disease Mother    Thyroid  disease Mother    Hypertension Mother    Diabetes Daughter    Heart disease Daughter    Hypertension Daughter    Hypertension Son    Family history: Family history reviewed and not pertinent.  Prior to Admission medications   Medication Sig Start Date End Date Taking? Authorizing Provider  allopurinol  (ZYLOPRIM ) 100 MG tablet Take 1 tablet (100 mg total) by mouth daily. 01/24/24   Rice, Bruckner ORN, MD  apixaban  (ELIQUIS ) 5 MG TABS tablet Take 1 tablet (5 mg total) by mouth 2 (two) times daily. 06/19/23   End, Bruckner, MD  atorvastatin  (LIPITOR ) 80 MG tablet Take 1 tablet (80 mg total) by mouth daily. 01/09/24   Marylynn Verneita CROME, MD  budesonide -formoterol  (SYMBICORT ) 160-4.5 MCG/ACT inhaler Inhale 2 puffs into the lungs 2 (two) times daily. Patient not  taking: Reported on 02/26/2024 03/07/23   Sood, Vineet, MD  cefdinir  (OMNICEF ) 300 MG capsule Take 300 mg by mouth 2 (two) times daily.    [provider]  Cholecalciferol  (VITAMIN D3) 250 MCG (10000 UT) capsule Take 10,000 Units by mouth daily.    [provider]  clotrimazole -betamethasone  (LOTRISONE ) cream Apply 1 application topically 2 (two)  times daily as needed (irritation).    [provider]  colchicine  0.6 MG tablet Take 1 tablet (0.6 mg total) by mouth 2 (two) times daily. Patient not taking: Reported on 02/26/2024 12/19/23   Bair, Kalpana, MD  conjugated estrogens  (PREMARIN ) vaginal cream Apply one pea-sized amount around the opening of the urethra daily for 2 weeks, then 3 times weekly moving forward. 04/20/22   Vaillancourt, Samantha, PA-C  Continuous Glucose Sensor (FREESTYLE LIBRE 3 PLUS SENSOR) MISC Change sensor every 15 days. 01/07/24   Therisa Benton PARAS, NP  cyanocobalamin  (VITAMIN B12) 1000 MCG/ML injection Inject 1 mL (1,000 mcg total) into the muscle every 30 (thirty) days. Inject 1 ml (1000 mcg ) IM weekly x 4,  Then monthly thereafter 10/09/23   Marylynn Verneita CROME, MD  dapagliflozin  propanediol (FARXIGA ) 10 MG TABS tablet Take 1 tablet (10 mg total) by mouth daily. 01/07/24   Therisa Benton PARAS, NP  dicyclomine  (BENTYL ) 20 MG tablet Take 1 tablet (20 mg total) by mouth 3 (three) times daily before meals. 01/09/24   Marylynn Verneita CROME, MD  DULoxetine  (CYMBALTA ) 60 MG capsule Take 1 capsule (60 mg total) by mouth daily. 05/01/23   Gayland Lauraine PARAS, NP  ezetimibe  (ZETIA ) 10 MG tablet Take 1 tablet (10 mg total) by mouth daily. 01/09/24 04/08/24  Marylynn Verneita CROME, MD  fexofenadine (ALLEGRA) 180 MG tablet Take 180 mg by mouth as needed for allergies or rhinitis.    [provider]  Finerenone  (KERENDIA ) 10 MG TABS Take 1 tablet (10 mg total) by mouth in the morning. 10/21/23   Furth, Cadence H, PA-C  fluticasone  (FLONASE ) 50 MCG/ACT nasal spray Place 2 sprays into  both nostrils daily. 04/11/22   Maribeth Camellia MATSU, MD  Glucagon  (GVOKE HYPOPEN  2-PACK) 0.5 MG/0.1ML SOAJ Inject 1 auto injector subcutaneously as needed for severe hypoglycemia. May repeat x 1 after 15 minutes if needed. 09/25/22   Gretel App, NP  glucose blood (ONETOUCH VERIO) test strip Use to check blood sugar twice daily. 02/23/22   Marylynn Verneita CROME, MD  insulin  aspart (NOVOLOG  FLEXPEN) 100 UNIT/ML FlexPen Inject 8-14 Units into the skin 3 (three) times daily with meals. 01/07/24   Therisa Benton PARAS, NP  insulin  degludec (TRESIBA  FLEXTOUCH) 100 UNIT/ML FlexTouch Pen Inject 25 Units into the skin daily. 02/03/24   Patel, Sona, MD  Insulin  Pen Needle (DROPLET PEN NEEDLES) 31G X 5 MM MISC USE ONE NEEDLE SUBCUTANEOUSLY AS DIRECTED (REMOVE AND DISCARD NEEDLE IN SHARPS CONTAINER IMMEDIATELY AFTER USE) 09/30/23   Therisa Benton PARAS, NP  lidocaine  (LIDODERM ) 5 % PLACE 1 PATCH ONTO THE SKIN DAILY. REMOVE & DISCARD PATCH WITHIN 12 HOURS OR AS DIRECTED BY MD 01/16/24   Marylynn Verneita CROME, MD  loperamide  (IMODIUM ) 2 MG capsule Take 4-6 mg by mouth 3 (three) times daily before meals.    [provider]  nitroGLYCERIN  (NITROSTAT ) 0.4 MG SL tablet Place 1 tablet (0.4 mg total) under the tongue every 5 (five) minutes as needed for chest pain. 04/14/23   Marylynn Verneita CROME, MD  nystatin  (MYCOSTATIN /NYSTOP ) powder Apply 1 g topically 4 (four) times daily as needed (rash).    [provider]  ondansetron  (ZOFRAN -ODT) 4 MG disintegrating tablet Take 1 tablet (4 mg total) by mouth every 8 (eight) hours as needed for nausea or vomiting. 07/13/22   Marylynn Verneita CROME, MD  OneTouch Delica Lancets 33G MISC Used to check blood sugar two times a day. 02/22/22   Marylynn Verneita CROME, MD  OVER  THE COUNTER MEDICATION Apply 1 application  topically daily as needed (pain). [place] Gel topical pain reliever    [provider]  pantoprazole  (PROTONIX ) 40 MG tablet Take 1 tablet (40 mg total) by mouth daily. 03/04/23 02/27/24   [place], PA-C  Polyethyl Glycol-Propyl Glycol (SYSTANE OP) Place 1 drop into both eyes 4 (four) times daily as needed (dry eyes).    [provider]  potassium chloride  20 MEQ/15ML (10%) SOLN Take 15 mLs (20 mEq total) by mouth daily. 05/15/23   End, Lonni, MD  pregabalin  (LYRICA ) 50 MG capsule Take 1 capsule (50 mg total) by mouth 2 (two) times daily. 08/01/23   Marylynn Verneita CROME, MD  tiZANidine  (ZANAFLEX ) 4 MG tablet Take 1 tablet (4 mg total) by mouth in the morning and at bedtime. 12/19/23   Bair, Kalpana, MD  torsemide  (DEMADEX ) 20 MG tablet Take 1 tablet (20 mg total) by mouth daily. 01/27/24 04/26/24  Sabharwal, Aditya, DO  traMADol  (ULTRAM ) 50 MG tablet Take 1 tablet (50 mg total) by mouth every 6 (six) hours as needed for severe pain (pain score 7-10) or moderate pain (pain score 4-6). 02/03/24   Tobie Calix, MD   Physical Exam: Vitals:   03/03/24 1800 03/03/24 1826 03/03/24 2037 03/03/24 2200  BP: (!) 118/57 117/61 (!) 103/50   Pulse: 75 77 81   Resp: 17 17 16    Temp:  98.3 F (36.8 C) 98.5 F (36.9 C) 99.2 F (37.3 C)  TempSrc:   Oral Oral  SpO2: 100% 100% 100%   Weight:  110.3 kg    Height:  5' 4 (1.626 m)     Constitutional: appears age-appropriate, NAD, calm Eyes: PERRL, lids and conjunctivae normal ENMT: Mucous membranes are moist. Posterior pharynx clear of any exudate or lesions. Age-appropriate dentition. Hearing appropriate Neck: normal, supple, no masses, no thyromegaly Respiratory: clear to auscultation bilaterally, no wheezing, no crackles. Normal respiratory effort. No accessory muscle use.  Cardiovascular: Regular rate and rhythm, no murmurs / rubs / gallops. No extremity edema. 2+ pedal pulses. No carotid bruits.  Abdomen: Morbidly obese abdomen, no tenderness, no masses palpated, no hepatosplenomegaly. Bowel sounds positive.  Musculoskeletal: no clubbing / cyanosis. No joint deformity upper and lower extremities. Good ROM, no contractures, no  atrophy. Normal muscle tone.  Skin: + left lateral foot/ankle lesion with increased erythema and edema.   Neurologic: Sensation intact. Strength 5/5 in all 4.  Psychiatric: Normal judgment and insight. Alert and oriented x 3. Normal mood.   EKG: Not indicated at this time  X-ray on Admission: Left foot x-ray ordered on admission, pending completion at this time. DG Foot Complete Left Result Date: 03/03/2024 CLINICAL DATA:  Cellulitis EXAM: LEFT FOOT - COMPLETE 3+ VIEW COMPARISON:  CT 01/24/2024, radiograph 11/27/2023 FINDINGS: Vascular calcifications and generalized soft tissue swelling. Prior partial resection of the fifth metatarsal. Chronic irregular arthropathy at the second third and fourth MTP joints. Advanced osseous destructive change and fragmentation of the midfoot, consistent with Charcot arthropathy, suspect that there is progressive erosive change and osseous destruction of the calcaneus, talus, cuboid navicular and cuneiform bones compared with the radiograph from May as there appears to be progressive subtalar collapse. There is increased effusion and soft tissue swelling compared with exam from May. Moderate plantar calcaneal spur. IMPRESSION: 1. Advanced osseous destructive change and fragmentation of the midfoot, consistent with Charcot arthropathy, suspect that there is progressive erosive change and osseous destruction of the calcaneus, talus, cuboid navicular and cuneiform bones compared with  the radiograph from May. There is increased effusion and soft tissue swelling compared with exam from May. Findings are concerning for progressive Charcot arthropathy versus superimposed osteomyelitis. 2. Prior partial resection of the fifth metatarsal. Chronic irregular arthropathy at the second third and fourth MTP joints. Chronic degenerative change at the first IP and MTP joints. Electronically Signed   By: Luke Bun M.D.   On: 03/03/2024 19:37    Labs on Admission: I have personally  reviewed following labs CBC: Recent Labs  Lab 03/03/24 1124 03/03/24 1452  WBC 12.2* 15.0*  NEUTROABS 8.6* 10.8*  HGB 11.9* 12.5  HCT 38.2 40.7  MCV 89.3 93.8  PLT 388.0 421*   Basic Metabolic Panel: Recent Labs  Lab 03/03/24 1111 03/03/24 1452  NA 140 138  K 3.9 3.8  CL 103 105  CO2 25 22  GLUCOSE 192* 271*  BUN 10 11  CREATININE 1.01 1.08*  CALCIUM  8.4 8.6*   GFR: Estimated Creatinine Clearance: 52.1 mL/min (A) (by C-G formula based on SCr of 1.08 mg/dL (H)).  Liver Function Tests: Recent Labs  Lab 03/03/24 1452  AST 18  ALT 14  ALKPHOS 165*  BILITOT 0.7  PROT 6.9  ALBUMIN  2.9*   Urine analysis:    Component Value Date/Time   COLORURINE YELLOW (A) 03/03/2024 2205   APPEARANCEUR HAZY (A) 03/03/2024 2205   APPEARANCEUR Clear 12/23/2023 1309   LABSPEC 1.023 03/03/2024 2205   LABSPEC 1.006 06/21/2014 1623   PHURINE 5.0 03/03/2024 2205   GLUCOSEU >=500 (A) 03/03/2024 2205   GLUCOSEU 500 (A) 02/17/2021 1008   HGBUR NEGATIVE 03/03/2024 2205   BILIRUBINUR NEGATIVE 03/03/2024 2205   BILIRUBINUR Negative 12/23/2023 1309   BILIRUBINUR Negative 06/21/2014 1623   KETONESUR NEGATIVE 03/03/2024 2205   PROTEINUR NEGATIVE 03/03/2024 2205   UROBILINOGEN 0.2 02/17/2021 1008   NITRITE NEGATIVE 03/03/2024 2205   LEUKOCYTESUR NEGATIVE 03/03/2024 2205   LEUKOCYTESUR Negative 06/21/2014 1623   This document was prepared using Dragon Voice Recognition software and may include unintentional dictation errors.  Dr. Sherre [facility]  If 7PM-7AM, please contact overnight-coverage provider If 7AM-7PM, please contact day attending provider [email or web address]  03/03/2024, 10:58 PM

## 2024-03-03 NOTE — Assessment & Plan Note (Addendum)
 Patient had fever of 100.5, leukocytosis of 15 Lactic acid elevated at 2.0, source is likely left lower extremity cellulitis Blood cultures x 2 are in process UA has been ordered pending collection Will follow-up with second lactic acid Continue with vancomycin  per pharmacy Sodium chloride  1 L bolus completed by EDP; LR infusion 125 ml/hr, 1 day ordered

## 2024-03-03 NOTE — ED Notes (Addendum)
 Pt states she's had cellulitis in R hand for a month. Now has swelling in L hand that's been present for about a week. Pt reports she has hx of gout in L foot. Pt reports home health RN found that L foot was weeping yesterday when it was taken out of boot. Pt reports she had a UTI a week and a half ago. Was prescribed antibiotic for UTI, finished antibitoic Friday and that same day and she reports developing a low grade fever from Saturday and it going away on Sunday.

## 2024-03-03 NOTE — ED Notes (Signed)
 Called ccmd for pt monitoring

## 2024-03-03 NOTE — Telephone Encounter (Signed)
 Pt is coming in for lab work today at 11:30. Pt called in this morning stating that she is having 4 to 5 loose, mucous like, yellow in color bowel movements daily. Pt stated that she recently completed abx's for a UTI. Would you like for me to add on a c. Diff and GI pathogen lab?   Pt also stated that she thinks she is developing cellulitis of the left hand again because she has started having left hand pain. Pt would like to schedule an appt today with Dr. Marylynn. No appts available.

## 2024-03-04 ENCOUNTER — Ambulatory Visit: Admitting: Internal Medicine

## 2024-03-04 ENCOUNTER — Inpatient Hospital Stay

## 2024-03-04 DIAGNOSIS — L03116 Cellulitis of left lower limb: Secondary | ICD-10-CM | POA: Diagnosis not present

## 2024-03-04 LAB — GLUCOSE, CAPILLARY
Glucose-Capillary: 105 mg/dL — ABNORMAL HIGH (ref 70–99)
Glucose-Capillary: 165 mg/dL — ABNORMAL HIGH (ref 70–99)
Glucose-Capillary: 107 mg/dL — ABNORMAL HIGH (ref 70–99)
Glucose-Capillary: 176 mg/dL — ABNORMAL HIGH (ref 70–99)

## 2024-03-04 LAB — BLOOD CULTURE ID PANEL (REFLEXED) - BCID2

## 2024-03-04 LAB — BASIC METABOLIC PANEL WITH GFR
Anion gap: 9 (ref 5–15)
BUN: 16 mg/dL (ref 8–23)
CO2: 23 mmol/L (ref 22–32)
Calcium: 8.2 mg/dL — ABNORMAL LOW (ref 8.9–10.3)
Chloride: 109 mmol/L (ref 98–111)
Creatinine, Ser: 1.17 mg/dL — ABNORMAL HIGH (ref 0.44–1.00)
GFR, Estimated: 48 mL/min — ABNORMAL LOW (ref >60)
Glucose, Bld: 132 mg/dL — ABNORMAL HIGH (ref 70–99)
Potassium: 4.1 mmol/L (ref 3.5–5.1)
Sodium: 141 mmol/L (ref 135–145)

## 2024-03-04 LAB — C-REACTIVE PROTEIN: CRP: 12.4 mg/dL — ABNORMAL HIGH (ref <1.0)

## 2024-03-04 LAB — CBC
HCT: 32.8 % — ABNORMAL LOW (ref 36.0–46.0)
Hemoglobin: 10.3 g/dL — ABNORMAL LOW (ref 12.0–15.0)
MCH: 28.8 pg (ref 26.0–34.0)
MCHC: 31.4 g/dL (ref 30.0–36.0)
MCV: 91.6 fL (ref 80.0–100.0)
Platelets: 366 K/uL (ref 150–400)
RBC: 3.58 MIL/uL — ABNORMAL LOW (ref 3.87–5.11)
RDW: 16 % — ABNORMAL HIGH (ref 11.5–15.5)
WBC: 15.1 K/uL — ABNORMAL HIGH (ref 4.0–10.5)
nRBC: 0 % (ref 0.0–0.2)

## 2024-03-04 LAB — LACTIC ACID, PLASMA: Lactic Acid, Venous: 1.3 mmol/L (ref 0.5–1.9)

## 2024-03-04 LAB — SEDIMENTATION RATE: Sed Rate: 65 mm/h — ABNORMAL HIGH (ref 0–30)

## 2024-03-04 MED ORDER — COLCHICINE 0.6 MG PO TABS: 0.6000 mg | ORAL_TABLET | Freq: Two times a day (BID) | ORAL | Status: DC

## 2024-03-04 MED ORDER — SODIUM CHLORIDE 0.9 % IV BOLUS
500.0000 mL | Freq: Once | INTRAVENOUS | Status: AC
  Administered 2024-03-04: 500 mL via INTRAVENOUS

## 2024-03-04 MED ORDER — LACTATED RINGERS IV SOLN: INTRAVENOUS | Status: DC

## 2024-03-04 NOTE — Progress Notes (Signed)
 PROGRESS NOTE    KENDALYN CRANFIELD  FMW:[id number] DOB: 06-May-1946 DOA: 03/03/2024 PCP: Marylynn Verneita CROME, MD  Chief Complaint  Patient presents with   Wound Infection    Hospital Course:  Ms. Quatisha Zylka is a 78 year old with hypertension, hyperlipidemia, CAD, anxiety, asthma, CKD, who presents from urology for chief concern of worsening wounds of the left foot, reports low grade fever at home, chills and increasing pain with discharge of her left foot. Progressive charcoaled arthropathy vs superimposed OM as per [facility] Hospital course as below   Subjective: Patient was examined at the bedside, states she had chills which now resolved.  discussed with X ray findings with pending MRI and Podiatry evaluation   Objective: Vitals:   03/03/24 2037 03/03/24 2200 03/04/24 0402 03/04/24 0800  BP: (!) 103/50  99/61 109/61  Pulse: 81  71 70  Resp: 16  16 18   Temp: 98.5 F (36.9 C) 99.2 F (37.3 C) 98.3 F (36.8 C) 98.6 F (37 C)  TempSrc: Oral Oral Oral Oral  SpO2: 100%  97% 97%  Weight:      Height:       No intake or output data in the 24 hours ending 03/04/24 0819 Filed Weights   03/03/24 1826  Weight: 110.3 kg    Examination: Constitutional: appears age-appropriate, NAD, calm Neck: normal, supple, no masses, no thyromegaly Respiratory: clear to auscultation bilaterally, no wheezing, no crackles. Normal respiratory effort. No accessory muscle use.  Cardiovascular: Regular rate and rhythm, no murmurs / rubs / gallops. No extremity edema. 2+ pedal pulses. No carotid bruits.  Abdomen: Morbidly obese abdomen, no tenderness, no masses palpated, no hepatosplenomegaly. Bowel sounds positive.  Musculoskeletal: no clubbing / cyanosis. No joint deformity upper and lower extremities. Good ROM, no contractures, no atrophy. Normal muscle tone.  Skin: + left lateral foot/ankle lesion with increased erythema and edema Neurologic: Sensation intact. 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  Suspect progressive erosive change and osseous destruction of calcaneus, talus, cuboid navicular and cuneiform bones.  Increased effusion and soft tissue swelling.  Progressive charcoaled arthropathy vs superimposed OM - MRI Left foot pending - Podiatry consult - ESR. CRP   SIRS (systemic inflammatory response syndrome) (HCC) Patient had fever of 100.5, leukocytosis of 15, Lactic acidosis Blood cultures x 2 : GPC, pending UA hazy, glucosuria, no evidence of UTI Continue with vancomycin  per pharmacy S/p IV fluids   Persistent atrial fibrillation (HCC) Home apixaban   5 mg p.o. twice daily resumed   Iron  deficiency anemia Patient reports that she gets iron  infusion Outpatient follow-up as above.   OSA on CPAP CPAP nightly ordered  HTN Reports having BP discrepancy, higher in LE > UE chronically Follow up outpatient   Type II diabetes mellitus with neurological  manifestations (HCC) Home long acting insulin , degludec 25 units subcutaneous daily resumed Insulin  SSI with HS coverage Goal inpatient blood glucose level 140-180   Chronic anxiety Home duloxetine  60 mg daily resumed   Hyperlipidemia Home atorvastatin  80 mg nightly resumed   Major depressive disorder with current active episode Home duloxetine  60 mg daily resumed  CKD stage 3a Monitor Cr  NICM with preserved ejection fraction - On torsemide  daily.    Morbid obesity (HCC) This complicates overall care and prognosis.    History of gout Outpatient 01/24/2024 Rheumatology note reviewed: Allopurinol  100 mg daily resumed, continue colchicine  as needed for inflammation Allopurinol  100 mg daily resume; discontinue scheduled colchicine    GAD (generalized anxiety disorder) PDMP reviewed  Obesity hypoventilation syndrome (HCC) Cpap at bedtime ordered   Weakness Fall precaution, PT, OT  DVT prophylaxis: Eliquis    Code Status: Full Code Disposition:  TBD  Consultants:  Podiatry  Procedures:  None  Antimicrobials:  Anti-infectives (From admission, onward)    Start     Dose/Rate Route Frequency Ordered Stop   03/04/24 1700  vancomycin  (VANCOREADY) IVPB 1000 mg/200 mL        1,000 mg 200 mL/hr over 60 Minutes Intravenous Every 24 hours 03/03/24 1738     03/03/24 1730  vancomycin  (VANCOREADY) IVPB 2000 mg/400 mL        2,000 mg 200 mL/hr over 120 Minutes Intravenous  Once 03/03/24 1729 03/04/24 0534   03/03/24 1645  vancomycin  (VANCOCIN ) IVPB 1000 mg/200 mL premix  Status:  Discontinued        1,000 mg 200 mL/hr over 60 Minutes Intravenous  Once 03/03/24 1635 03/03/24 1729       Data Reviewed: I have personally reviewed following labs and imaging studies CBC: Recent Labs  Lab 03/03/24 1124 03/03/24 1452 03/04/24 0548  WBC 12.2* 15.0* 15.1*  NEUTROABS 8.6* 10.8*  --   HGB 11.9* 12.5 10.3*  HCT 38.2 40.7 32.8*  MCV 89.3 93.8 91.6  PLT 388.0 421* 366   Basic  Metabolic Panel: Recent Labs  Lab 03/03/24 1111 03/03/24 1452 03/04/24 0548  NA 140 138 141  K 3.9 3.8 4.1  CL 103 105 109  CO2 25 22 23   GLUCOSE 192* 271* 132*  BUN 10 11 16   CREATININE 1.01 1.08* 1.17*  CALCIUM  8.4 8.6* 8.2*   GFR: Estimated Creatinine Clearance: 48.1 mL/min (A) (by C-G formula based on SCr of 1.17 mg/dL (H)). Liver Function Tests: Recent Labs  Lab 03/03/24 1452  AST 18  ALT 14  ALKPHOS 165*  BILITOT 0.7  PROT 6.9  ALBUMIN  2.9*   CBG: Recent Labs  Lab 03/03/24 1801 03/03/24 2104 03/04/24 0802  GLUCAP 125* 93 105*    No results found for this or any previous visit (from the past 240 hours).   Radiology Studies: DG Foot Complete Left Result Date: 03/03/2024 CLINICAL DATA:  Cellulitis EXAM: LEFT FOOT - COMPLETE 3+ VIEW COMPARISON:  CT 01/24/2024, radiograph 11/27/2023 FINDINGS: Vascular calcifications and generalized soft tissue swelling. Prior partial resection of the fifth metatarsal. Chronic irregular arthropathy at the second third and fourth MTP joints. Advanced osseous destructive change and fragmentation of the midfoot, consistent with Charcot arthropathy,  suspect that there is progressive erosive change and osseous destruction of the calcaneus, talus, cuboid navicular and cuneiform bones compared with the radiograph from May as there appears to be progressive subtalar collapse. There is increased effusion and soft tissue swelling compared with exam from May. Moderate plantar calcaneal spur. IMPRESSION: 1. Advanced osseous destructive change and fragmentation of the midfoot, consistent with Charcot arthropathy, suspect that there is progressive erosive change and osseous destruction of the calcaneus, talus, cuboid navicular and cuneiform bones compared with the radiograph from May. There is increased effusion and soft tissue swelling compared with exam from May. Findings are concerning for progressive Charcot arthropathy versus superimposed  osteomyelitis. 2. Prior partial resection of the fifth metatarsal. Chronic irregular arthropathy at the second third and fourth MTP joints. Chronic degenerative change at the first IP and MTP joints. Electronically Signed   By: Luke Bun M.D.   On: 03/03/2024 19:37    Scheduled Meds:  allopurinol   100 mg Oral Daily   apixaban   5 mg Oral BID   atorvastatin   80 mg Oral QHS   dapagliflozin  propanediol  10 mg Oral Daily   DULoxetine   60 mg Oral Daily   ezetimibe   10 mg Oral Daily   Finerenone   10 mg Oral q AM   fluticasone  furoate-vilanterol  1 puff Inhalation Daily   insulin  aspart  0-15 Units Subcutaneous TID WC   insulin  aspart  0-5 Units Subcutaneous QHS   insulin  glargine  25 Units Subcutaneous Q2200   pantoprazole   40 mg Oral Daily   pregabalin   50 mg Oral BID   tiZANidine   4 mg Oral BID   torsemide   20 mg Oral Daily   Continuous Infusions:  lactated ringers  125 mL/hr at 03/04/24 0647   vancomycin        LOS: 1 day  MDM: Patient is high risk for one or more organ failure.  They necessitate ongoing hospitalization for continued IV therapies and subsequent lab monitoring. Total time spent interpreting labs and vitals, reviewing the medical record, coordinating care amongst consultants and care team members, directly assessing and discussing care with the patient and/or family: 55 min  Laree Lock, MD [facility]  To contact the attending physician between 7A-7P please use Epic Chat. To contact the covering physician during after hours 7P-7A, please review Amion.  03/04/2024, 8:19 AM   *This document has been created with the assistance of dictation software. Please excuse typographical errors. *

## 2024-03-04 NOTE — Plan of Care (Signed)
  Problem: Coping: Goal: Ability to adjust to condition or change in health will improve Outcome: Progressing   Problem: Tissue Perfusion: Goal: Adequacy of tissue perfusion will improve Outcome: Progressing   Problem: Clinical Measurements: Goal: Will remain free from infection Outcome: Progressing Goal: Respiratory complications will improve Outcome: Progressing   Problem: Activity: Goal: Risk for activity intolerance will decrease Outcome: Progressing

## 2024-03-04 NOTE — Progress Notes (Signed)
 PHARMACY - PHYSICIAN COMMUNICATION CRITICAL VALUE ALERT - BLOOD CULTURE IDENTIFICATION (BCID)  Denise Sharp is an 78 y.o. female who presented to [facility] on 03/03/2024 with a chief complaint of worsening left foot cellulitis  Assessment:  blood culture from 8/19 with GPC in 1 of 4 species.  BCID detects staphylococcus species (Not S. Aureus or S epidermidis).   Name of physician (or Provider) Contacted: Dr Jerelene  Current antibiotics: vancomycin    Changes to prescribed antibiotics recommended:  Patient is on recommended antibiotics - No changes needed.  Seems to be likely contaminant at this time but on active therapy for cellulitis  Results for orders placed or performed during the hospital encounter of 03/03/24  Blood Culture ID Panel (Reflexed) (Collected: 03/03/2024  5:01 PM)  Result Value Ref Range   Enterococcus faecalis NOT DETECTED NOT DETECTED   Enterococcus Faecium NOT DETECTED NOT DETECTED   Listeria monocytogenes NOT DETECTED NOT DETECTED   Staphylococcus species DETECTED (A) NOT DETECTED   Staphylococcus aureus (BCID) NOT DETECTED NOT DETECTED   Staphylococcus epidermidis NOT DETECTED NOT DETECTED   Staphylococcus lugdunensis NOT DETECTED NOT DETECTED   Streptococcus species NOT DETECTED NOT DETECTED   Streptococcus agalactiae NOT DETECTED NOT DETECTED   Streptococcus pneumoniae NOT DETECTED NOT DETECTED   Streptococcus pyogenes NOT DETECTED NOT DETECTED   A.calcoaceticus-baumannii NOT DETECTED NOT DETECTED   Bacteroides fragilis NOT DETECTED NOT DETECTED   Enterobacterales NOT DETECTED NOT DETECTED   Enterobacter cloacae complex NOT DETECTED NOT DETECTED   Escherichia coli NOT DETECTED NOT DETECTED   Klebsiella aerogenes NOT DETECTED NOT DETECTED   Klebsiella oxytoca NOT DETECTED NOT DETECTED   Klebsiella pneumoniae NOT DETECTED NOT DETECTED   Proteus species NOT DETECTED NOT DETECTED   Salmonella species NOT DETECTED NOT DETECTED   Serratia marcescens NOT  DETECTED NOT DETECTED   Haemophilus influenzae NOT DETECTED NOT DETECTED   Neisseria meningitidis NOT DETECTED NOT DETECTED   Pseudomonas aeruginosa NOT DETECTED NOT DETECTED   Stenotrophomonas maltophilia NOT DETECTED NOT DETECTED   Candida albicans NOT DETECTED NOT DETECTED   Candida auris NOT DETECTED NOT DETECTED   Candida glabrata NOT DETECTED NOT DETECTED   Candida krusei NOT DETECTED NOT DETECTED   Candida parapsilosis NOT DETECTED NOT DETECTED   Candida tropicalis NOT DETECTED NOT DETECTED   Cryptococcus neoformans/gattii NOT DETECTED NOT DETECTED    Celestine Slovak, PharmD, BCPS, BCIDP Work Cell: [phone] 03/04/2024 10:41 AM

## 2024-03-04 NOTE — Evaluation (Signed)
 Physical Therapy Evaluation Patient Details Name: Denise Sharp MRN: [id number] DOB: 02-21-1946 Today's Date: 03/04/2024  History of Present Illness  Pt is a 78 year old with hypertension, hyperlipidemia, CAD, anxiety, asthma, CKD, who presents from urology for chief concern of worsening wounds of the left foot.  Dx: Cellulitis of left foot, SIRS (systemic inflammatory response syndrome), Persistent atrial fibrillation  Clinical Impression  Pt is a pleasant 78 year old female who was admitted for cellulitis of her LLE. Pt performs bed mobility and transfers  with min assistance, however required use of bed features to assist these movements. Pt also required verbal cuing for sequencing. Prior to hospitalization, pt was able to ambulate short distances using RW and her husband was able to assist her with ADLs. She reports that her level of assistance has now increased. Pt demonstrates deficits with strength, balance, and activity tolerance. Would benefit from skilled PT to address above deficits and promote optimal return to PLOF.          If plan is discharge home, recommend the following: A lot of help with walking and/or transfers;A little help with bathing/dressing/bathroom;Assistance with cooking/housework;Assist for transportation;Help with stairs or ramp for entrance   Can travel by private vehicle   Yes    Equipment Recommendations None recommended by PT  Recommendations for Other Services       Functional Status Assessment Patient has had a recent decline in their functional status and demonstrates the ability to make significant improvements in function in a reasonable and predictable amount of time.     Precautions / Restrictions Precautions Precautions: Fall Recall of Precautions/Restrictions: Intact Restrictions Weight Bearing Restrictions Per Provider Order: No Other Position/Activity Restrictions: Assume NWB on LLE until MRI and podiatry consult complete.       Mobility  Bed Mobility Overal bed mobility: Needs Assistance Bed Mobility: Supine to Sit, Sit to Supine     Supine to sit: Min assist, HOB elevated, Used rails Sit to supine: Min assist, HOB elevated, Used rails   General bed mobility comments: Pt able to perform bed mobility with increased time. Verbal cuing for hand placement and sequencing.    Transfers Overall transfer level: Needs assistance Equipment used: Rolling walker (2 wheels) Transfers: Sit to/from Stand Sit to Stand: Min assist           General transfer comment: Pt able to take shuffle side steps at EOB with verbal cuing to continue NWB on LLE. SBA for STS.    Ambulation/Gait               General Gait Details: NT d/t safety. Assuming NWB on LLE until imaging and podiatry consult is complete.  Stairs            Wheelchair Mobility     Tilt Bed    Modified Rankin (Stroke Patients Only)       Balance Overall balance assessment: Needs assistance Sitting-balance support: Feet supported, Single extremity supported Sitting balance-Leahy Scale: Fair Sitting balance - Comments: Pt able to maintain seated balance while using her RUE for support and feet supported.   Standing balance support: During functional activity, Reliant on assistive device for balance, Bilateral upper extremity supported Standing balance-Leahy Scale: Poor Standing balance comment: Pt able to perform STS with SBA. No instances of LOB  while standing at EOB.  Pertinent Vitals/Pain Pain Assessment Pain Assessment: 0-10 Pain Score: 5  Pain Location: L hand Pain Descriptors / Indicators: Guarding Pain Intervention(s): Limited activity within patient's tolerance    Home Living Family/patient expects to be discharged to:: Private residence Living Arrangements: Spouse/significant other Available Help at Discharge: Family;Available 24 hours/day Type of Home: House Home Access:  Ramped entrance       Home Layout: One level Home Equipment: Agricultural consultant (2 wheels);Electric scooter;Shower seat;Cane - single point;Rollator (4 wheels);Shower seat - built in;Grab bars - toilet;Grab bars - tub/shower      Prior Function Prior Level of Function : Needs assist       Physical Assist : Mobility (physical) Mobility (physical): Gait   Mobility Comments: Pt reports ability to ambulate short distances using RW in the home only.       Extremity/Trunk Assessment   Upper Extremity Assessment Upper Extremity Assessment: Generalized weakness;LUE deficits/detail LUE Deficits / Details: Pt reports inability to use LUE d/t pain and swelling LUE: Unable to fully assess due to pain    Lower Extremity Assessment Lower Extremity Assessment: Generalized weakness    Cervical / Trunk Assessment Cervical / Trunk Assessment: Normal  Communication   Communication Communication: No apparent difficulties    Cognition Arousal: Alert Behavior During Therapy: WFL for tasks assessed/performed   PT - Cognitive impairments: No apparent impairments                       PT - Cognition Comments: Pt is pleasant and agreeable to PT session. Following commands: Intact       Cueing Cueing Techniques: Verbal cues     General Comments General comments (skin integrity, edema, etc.): Undressed wound on LLE.    Exercises Other Exercises Other Exercises: Ankle pumps   Assessment/Plan    PT Assessment Patient needs continued PT services  PT Problem List Decreased strength;Decreased activity tolerance;Decreased balance;Decreased mobility;Decreased skin integrity       PT Treatment Interventions DME instruction;Gait training;Functional mobility training;Therapeutic activities;Therapeutic exercise;Balance training;Neuromuscular re-education;Patient/family education    PT Goals (Current goals can be found in the Care Plan section)  Acute Rehab PT Goals Patient Stated  Goal: To get better PT Goal Formulation: With patient Time For Goal Achievement: 03/18/24 Potential to Achieve Goals: Fair    Frequency Min 2X/week     Co-evaluation               AM-PAC PT 6 Clicks Mobility  Outcome Measure Help needed turning from your back to your side while in a flat bed without using bedrails?: A Little Help needed moving from lying on your back to sitting on the side of a flat bed without using bedrails?: A Little Help needed moving to and from a bed to a chair (including a wheelchair)?: A Lot Help needed standing up from a chair using your arms (e.g., wheelchair or bedside chair)?: A Little Help needed to walk in hospital room?: A Lot Help needed climbing 3-5 steps with a railing? : A Lot 6 Click Score: 15    End of Session Equipment Utilized During Treatment: Gait belt Activity Tolerance: Patient tolerated treatment well Patient left: in bed;with call bell/phone within reach;with bed alarm set;with family/visitor present Nurse Communication: Mobility status PT Visit Diagnosis: Unsteadiness on feet (R26.81);Muscle weakness (generalized) (M62.81);Difficulty in walking, not elsewhere classified (R26.2)    Time: 8574-8554 PT Time Calculation (min) (ACUTE ONLY): 20 min   Charges:  Nylani Michetti, SPT   Meeka Cartelli 03/04/2024, 4:47 PM

## 2024-03-04 NOTE — Progress Notes (Signed)
 BP 73/44, pt stated that she runs chronic low BP in upper extremities.  She did not want staff to take BP on leg.  MD notified and aware that she has chronic low BP.  Pt stated that 70's systolic is good for her.  MD aware.

## 2024-03-04 NOTE — [place] FL2 (Signed)
 [place]  MEDICAID FL2 LEVEL OF CARE FORM     IDENTIFICATION  Patient Name: Denise Sharp Birthdate: 1945-07-23 Sex: female Admission Date (Current Location): 03/03/2024  [facility] and [place] Number:  Chiropodist and Address:  [facility], [address] [place], [place] [address]      Provider Number: [id number]  Attending Physician Name and Address:  Jerelene Critchley, MD  Relative Name and Phone Number:       Current Level of Care: Hospital Recommended Level of Care: Skilled Nursing Facility Prior Approval Number:    Date Approved/Denied:   PASRR Number: [id number]  Discharge Plan: SNF    Current Diagnoses: Patient Active Problem List   Diagnosis Date Noted   Cellulitis of left foot 03/03/2024   SIRS (systemic inflammatory response syndrome) (HCC) 03/03/2024   History of gout 03/03/2024   Charcot joint of left foot 02/02/2024   Cellulitis 01/31/2024   Gout of left ankle 01/31/2024   Acute gout due to renal impairment involving toe of left foot 12/19/2023   Foot cramps 12/19/2023   Pain in left ankle and joints of left foot 12/19/2023   Osteoarthritis of left foot 12/19/2023   Gouty arthritis of hand 10/10/2023   Hematochezia 05/15/2023   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   Persistent atrial fibrillation (HCC) 09/27/2022   Hypoglycemia due to insulin  09/24/2022   COVID-19 virus infection 09/01/2022   Abdominal bloating 07/25/2022   Cerebral vascular disease 06/11/2022   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   Orthostatic dizziness 04/03/2022   Gait abnormality 04/03/2022   Hx of amaurosis fugax 02/06/2022   Protein-calorie malnutrition (HCC) 11/21/2021   Absolute anemia 10/19/2021   Chronic neck pain 09/12/2021   PAC (premature atrial contraction) 08/19/2021       08/03/2021   Stricture and stenosis of esophagus    Pain in joint of right knee 05/17/2021   Sprain of right ankle  05/17/2021   Sprain of right knee 05/17/2021   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/09/2021   PSVT (paroxysmal supraventricular tachycardia) (HCC) 12/15/2020   CKD stage 3 due to type 2 diabetes mellitus (HCC) 12/09/2020   Other pulmonary embolism with acute cor pulmonale, unspecified chronicity (HCC) 11/17/2020   Mild episode of recurrent major depressive disorder (HCC) 11/17/2020   Hyperlipidemia 08/21/2020   Iron  deficiency 06/28/2020   Elevated alkaline phosphatase level 06/10/2020   Chronic heart failure with preserved ejection fraction (HFpEF) (HCC) 01/14/2020   Coronary artery disease of native artery of native heart with stable angina pectoris (HCC) 01/13/2020   S/P cardiac catheterization 01/13/2020   Accelerating angina (HCC) 12/31/2019   Dysphagia 11/30/2019   Bladder pain 08/23/2019   Gastritis 08/07/2019   Lab test negative for COVID-19 virus 08/07/2019   S/P panniculectomy 06/16/2019   Major depressive disorder with current active episode 05/30/2019   Functional diarrhea 10/22/2018   Bilateral cataracts 06/26/2018   History of gastric bypass 02/05/2018   GAD (generalized anxiety disorder) 02/01/2018   Rectocele 01/24/2018   Vaginal prolapse 01/04/2018   Hepatic steatosis 01/04/2018   Microalbuminuria due to type 2 diabetes mellitus (HCC) 11/21/2017   Hospital discharge follow-up 09/22/2017   Fecal incontinence 09/22/2017   B12 deficiency 09/22/2017   Rotator cuff arthropathy, right 08/13/2017   Primary localized osteoarthrosis of shoulder 08/13/2017   Encounter for preoperative examination for general surgical procedure 08/03/2017   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) 08/03/2017   Candidiasis, intertrigo 08/03/2017   Venous stasis dermatitis  of right lower extremity 11/21/2016   Chronic anxiety 10/11/2015   Hypersomnia, persistent 06/18/2015   Mechanical breakdown of implanted electronic neurostimulator of peripheral nerve (HCC) 10/16/2014    Obesity hypoventilation syndrome (HCC) 08/13/2014   Frequent falls 06/23/2014   Chronic cough 05/05/2014   Adenoma of left adrenal gland 03/24/2014   Chronic intractable headache 03/23/2014   Syncope and collapse 03/12/2014   Vitamin D  deficiency 01/06/2014   Rectal bleeding 12/03/2013   Weight gain following gastric bypass surgery 12/03/2013   Morbid obesity (HCC) 08/25/2013   Weakness 08/04/2013   Diaphragmatic hernia 04/06/2013   Diabetic polyneuropathy (HCC) 03/25/2013   Abnormality of gait 03/25/2013   Multiple pulmonary nodules 03/20/2013   DOE (dyspnea on exertion) 03/20/2013   Iron  deficiency anemia 08/06/2012   Functional disorder of bladder 07/15/2012   Urge incontinence 07/15/2012   Detrusor overactivity 07/15/2012   OSA on CPAP 03/02/2012   Insomnia secondary to anxiety 02/29/2012   Sciatica 09/05/2011   Cervical stenosis of spinal canal 06/14/2011   Restless legs syndrome 03/13/2011   Type II diabetes mellitus with neurological manifestations (HCC) 03/12/2011   Hearing loss, right 03/12/2011    Orientation RESPIRATION BLADDER Height & Weight     Self, Time, Situation, Place    Incontinent Weight: 110.3 kg Height:  5' 4 (162.6 cm)  BEHAVIORAL SYMPTOMS/MOOD NEUROLOGICAL BOWEL NUTRITION STATUS      Incontinent Diet (heart healthy carb)  AMBULATORY STATUS COMMUNICATION OF NEEDS Skin   Limited Assist Verbally                         Personal Care Assistance Level of Assistance  Bathing, Feeding, Dressing Bathing Assistance: Limited assistance Feeding assistance: Independent Dressing Assistance: Limited assistance     Functional Limitations Info             SPECIAL CARE FACTORS FREQUENCY  PT (By licensed PT), OT (By licensed OT)     PT Frequency: 5 x week OT Frequency: 5 x week            Contractures      Additional Factors Info  Code Status, Allergies Code Status Info: FULL Allergies Info: Amoxicillin , Clarithromycin , Demeclocycline,  Erythromycin,  Flagyl (metronidazole), Glucophage (metformin Hcl), Tetracyclines & Related, Potassium Chloride , Tetracycline, Diovan (valsartan) Sulfa  Antibiotics,  Xanax (alprazolam)           Current Medications (03/04/2024):  This is the current hospital active medication list Current Facility-Administered Medications  Medication Dose Route Frequency Provider Last Rate Last Admin   acetaminophen  (TYLENOL ) tablet 650 mg  650 mg Oral Q6H PRN Cox, Amy N, DO       Or   acetaminophen  (TYLENOL ) suppository 650 mg  650 mg Rectal Q6H PRN Cox, Amy N, DO       allopurinol  (ZYLOPRIM ) tablet 100 mg  100 mg Oral Daily Cox, Amy N, DO   100 mg at 03/04/24 9048   apixaban  (ELIQUIS ) tablet 5 mg  5 mg Oral BID Cox, Amy N, DO   5 mg at 03/04/24 9047   atorvastatin  (LIPITOR ) tablet 80 mg  80 mg Oral QHS Cox, Amy N, DO   80 mg at 03/03/24 2136   dapagliflozin  propanediol (FARXIGA ) tablet 10 mg  10 mg Oral Daily Cox, Amy N, DO   10 mg at 03/04/24 0951   DULoxetine  (CYMBALTA ) DR capsule 60 mg  60 mg Oral Daily Cox, Amy N, DO   60 mg at 03/04/24 [phone]  ezetimibe  (ZETIA ) tablet 10 mg  10 mg Oral Daily Cox, Amy N, DO   10 mg at 03/03/24 2135   Finerenone  TABS 10 mg  10 mg Oral q AM Cox, Amy N, DO   10 mg at 03/04/24 9048   fluticasone  (FLONASE ) 50 MCG/ACT nasal spray 2 spray  2 spray Each Nare Daily PRN Cox, Amy N, DO       fluticasone  furoate-vilanterol (BREO ELLIPTA ) 200-25 MCG/ACT 1 puff  1 puff Inhalation Daily Cox, Amy N, DO   1 puff at 03/04/24 1002   hydrALAZINE  (APRESOLINE ) injection 5 mg  5 mg Intravenous Q6H PRN Cox, Amy N, DO       insulin  aspart (novoLOG ) injection 0-15 Units  0-15 Units Subcutaneous TID WC Cox, Amy N, DO   3 Units at 03/04/24 1224   insulin  aspart (novoLOG ) injection 0-5 Units  0-5 Units Subcutaneous QHS Cox, Amy N, DO       insulin  glargine (LANTUS ) injection 25 Units  25 Units Subcutaneous Q2200 Cox, Amy N, DO   25 Units at 03/03/24 2224   ipratropium-albuterol  (DUONEB) 0.5-2.5 (3)  MG/3ML nebulizer solution 3 mL  3 mL Nebulization Q6H PRN Cox, Amy N, DO       lidocaine  (LIDODERM ) 5 % 1 patch  1 patch Transdermal Daily PRN Cox, Amy N, DO       nitroGLYCERIN  (NITROSTAT ) SL tablet 0.4 mg  0.4 mg Sublingual Q5 min PRN Cox, Amy N, DO       nystatin  (MYCOSTATIN /NYSTOP ) topical powder 1 Application  1 g Topical QID PRN Cox, Amy N, DO       ondansetron  (ZOFRAN ) tablet 4 mg  4 mg Oral Q6H PRN Cox, Amy N, DO       Or   ondansetron  (ZOFRAN ) injection 4 mg  4 mg Intravenous Q6H PRN Cox, Amy N, DO       pantoprazole  (PROTONIX ) EC tablet 40 mg  40 mg Oral Daily Cox, Amy N, DO   40 mg at 03/04/24 9047   pregabalin  (LYRICA ) capsule 50 mg  50 mg Oral BID Cox, Amy N, DO   50 mg at 03/04/24 9048   senna-docusate (Senokot-S) tablet 1 tablet  1 tablet Oral QHS PRN Cox, Amy N, DO       tiZANidine  (ZANAFLEX ) tablet 4 mg  4 mg Oral BID Cox, Amy N, DO   4 mg at 03/04/24 9048   torsemide  (DEMADEX ) tablet 20 mg  20 mg Oral Daily Cox, Amy N, DO   20 mg at 03/04/24 0951   traMADol  (ULTRAM ) tablet 50 mg  50 mg Oral Q6H PRN Cox, Amy N, DO   50 mg at 03/03/24 2135   vancomycin  (VANCOREADY) IVPB 1000 mg/200 mL  1,000 mg Intravenous Q24H Niels Kayla FALCON, [facility]         Discharge Medications: Please see discharge summary for a list of discharge medications.  Relevant Imaging Results:  Relevant Lab Results:   Additional Information    Dalia GORMAN Fuse, RN

## 2024-03-04 NOTE — Consult Note (Signed)
   Received consult re: LT diabetic foot infection. MRI pending final read. Plan to see patient tomorrow, 03/05/24, around noon to consult and review MRI and discuss further treatment.   Thresa EMERSON Sar, DPM [facility]  Dr. Thresa EMERSON Sar, DPM    [address] [place], [place] [address]                Office [phone]  Fax [phone]

## 2024-03-04 NOTE — Progress Notes (Deleted)
 Office Visit Note  Patient: Denise Sharp             Date of Birth: 13-Jul-1946           MRN: [id number]             PCP: Denise Verneita CROME, MD Referring: Denise Verneita CROME, MD Visit Date: 03/04/2024   Subjective:  No chief complaint on file.   History of Present Illness: Denise Sharp is a 78 y.o. female here for follow up ***   Previous HPI 01/24/24 Denise Sharp is a 78 year old female here for evaluation of inflammation affecting her hand, feet, and ankle. She has a history of degenerative arthritis and stage 3 renal failure who presents with swelling and pain in her left foot and ankle. She is accompanied by her husband.   She has a long-standing history of degenerative arthritis affecting her spine, feet, and ankles. She uses mobility aids, including a scooter, walkers, and canes.   In March, her left foot began to swell significantly, and it was suggested to be gout. A tapering dose of prednisone  did not alleviate the swelling very well but caused her blood sugar to rise and she did not wish to repeat this. Her foot remains swollen, two sizes larger than normal, with stretch marks and an inability to bear weight. She describes the foot as feeling 'broken' and notes abnormal leg and foot alignment.   She has stage 3 renal failure, complicating treatment options for gout. She was advised that her kidneys could handle a short course of gout medication (sounds like referring to indomethacin), but she was not prescribed this. She takes colchicine , prescribed by her cardiologist, but it has not improved her foot condition much os far. She also takes 160 mg of torsemide , which exacerbates incontinence issues.   She experiences chronic pain in her foot despite having no sensation from the knees down due to neuropathy. She recounts an incident where she walked across gravel without realizing her shoes had fallen off. Neuropathy tests have shown no feeling in her legs, yet she  experiences severe pain in her foot.   Her hands have also been swelling, requiring a change in her wedding ring size. She attributes this to arthritis, as her joints are enlarging. She experiences cramps in her hands and toes, which she associates with neuropathy. Her uric acid level was recorded at 11.4.   She has a history of congestive heart failure and was hospitalized last year which was a new/index exacerbation leading to diagnosis. During this 30 pounds of fluid were removed. She has been on high doses of diuretics since then.   She has an artificial shoulder and reports swelling and redness in her joints, particularly in her hands and feet. She has not had recent imaging of her hands. She had a foot xray in May and is scheduled for a CT scan of her foot due to persistent swelling and pain. 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   No Rheumatology ROS completed.   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   Persistent atrial fibrillation (HCC) 09/27/2022   Hypoglycemia due to insulin  09/24/2022   COVID-19 virus infection 09/01/2022   Abdominal bloating 07/25/2022   Cerebral vascular disease 06/11/2022   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   Orthostatic dizziness 04/03/2022   Gait abnormality 04/03/2022   Hx of amaurosis fugax 02/06/2022    Protein-calorie malnutrition (HCC) 11/21/2021   Absolute anemia 10/19/2021   Chronic neck pain 09/12/2021   PAC (premature atrial contraction) 08/19/2021       08/03/2021   Stricture and stenosis of esophagus    Pain in joint of right knee 05/17/2021   Sprain of right ankle 05/17/2021   Sprain of right knee 05/17/2021   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/09/2021   PSVT (paroxysmal supraventricular tachycardia) (HCC) 12/15/2020   CKD stage 3 due to type 2 diabetes mellitus (HCC) 12/09/2020   Other pulmonary embolism with acute cor pulmonale, unspecified chronicity (HCC) 11/17/2020   Mild episode of recurrent major depressive disorder (HCC) 11/17/2020   Hyperlipidemia 08/21/2020   Iron  deficiency 06/28/2020   Elevated alkaline phosphatase level 06/10/2020   Chronic heart failure with preserved ejection fraction (HFpEF) (HCC) 01/14/2020   Coronary artery disease of native artery of native heart with stable angina pectoris (HCC) 01/13/2020   S/P cardiac catheterization 01/13/2020   Accelerating angina (HCC) 12/31/2019   Dysphagia 11/30/2019   Bladder pain 08/23/2019   Gastritis 08/07/2019   Lab test negative for COVID-19 virus 08/07/2019   S/P panniculectomy 06/16/2019   Major depressive disorder with current active episode 05/30/2019   Functional diarrhea 10/22/2018   Bilateral cataracts 06/26/2018   History of gastric bypass 02/05/2018   GAD (generalized anxiety disorder) 02/01/2018   Rectocele 01/24/2018   Vaginal prolapse 01/04/2018   Hepatic steatosis 01/04/2018   Microalbuminuria due to type 2 diabetes mellitus (HCC) 11/21/2017   Hospital discharge follow-up 09/22/2017   Fecal incontinence 09/22/2017   B12 deficiency 09/22/2017   Rotator cuff arthropathy, right 08/13/2017   Primary localized osteoarthrosis of shoulder 08/13/2017   Encounter for preoperative examination for general surgical procedure 08/03/2017   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)  08/03/2017   Candidiasis, intertrigo 08/03/2017   Venous stasis dermatitis of right lower extremity 11/21/2016   Chronic anxiety 10/11/2015   Hypersomnia, persistent 06/18/2015   Mechanical breakdown of implanted electronic neurostimulator of peripheral nerve (HCC) 10/16/2014   Obesity hypoventilation syndrome (HCC) 08/13/2014   Frequent falls 06/23/2014   Chronic cough 05/05/2014   Adenoma of left adrenal gland 03/24/2014   Chronic intractable headache 03/23/2014   Syncope and collapse 03/12/2014   Vitamin D  deficiency 01/06/2014   Rectal bleeding 12/03/2013   Weight gain following gastric bypass surgery 12/03/2013   Morbid obesity (HCC) 08/25/2013   Weakness 08/04/2013   Diaphragmatic hernia 04/06/2013   Diabetic polyneuropathy (HCC) 03/25/2013   Abnormality of gait 03/25/2013   Multiple pulmonary nodules 03/20/2013   DOE (dyspnea on exertion) 03/20/2013   Iron  deficiency anemia 08/06/2012   Functional disorder of bladder 07/15/2012   Urge incontinence 07/15/2012   Detrusor overactivity 07/15/2012   OSA on CPAP 03/02/2012   Insomnia secondary to anxiety 02/29/2012   Sciatica 09/05/2011   Cervical stenosis of spinal canal 06/14/2011   Restless legs syndrome  03/13/2011   Type II diabetes mellitus with neurological manifestations (HCC) 03/12/2011   Hearing loss, right 03/12/2011    Past Medical History:  Diagnosis Date   (HFpEF) heart failure with preserved ejection fraction (HCC)    a. 12/2019 Echo: EF 65-70%, Gr2 DD. No significant valvular dzs.   Abnormality of gait 03/25/2013   Adrenal mass, left (HCC)    Anginal pain (HCC)    Anxiety    Aortic atherosclerosis (HCC)    Arthritis    Asthma    CAD (coronary artery disease) with h/o Atypical Chest Pain    a. 04/1986 Cath ([facility]): nl cors, EF 65%; b. 07/2012 Cath [facility]): Diff minor irregs; c. 07/2016 MV [facility]): Equivocal; d. 08/2016 Cardiac CT Ca2+ score [facility]): Ca2+ 1548; e. 05/2019 MV: No ischemia. EF 75%; f. 12/2019 PCI:  LM nl, LAD 65ost (3.5x12 Resolute DES), 22m (2.75x12 Resolute DES),LCX/RCA nl; g. 08/2020 Cath: patent LAD stents, RCA 40ost, elev RH pressures, EF >65%.   Cervical spinal stenosis 1994   due to trauma to back ([employer] accident), has intermittent paralysis and parasthesias   Cervicogenic headache 03/23/2014   CKD (chronic kidney disease), stage III (HCC)    Depression    Diverticulosis    Dizziness    a.) chronic   DJD (degenerative joint disease)    a. Chronic R shoulder pain   Dyspnea    Esophageal stenosis 09/2009   a.) transient outlet obstruction by food, cleared by EGD   Family history of adverse reaction to anesthesia    a.) daughter with (+) PONV   Gastric bypass status for obesity    GERD (gastroesophageal reflux disease)    Gout    Headache(784.0)    HLD (hyperlipidemia)    Hypertension    IBS (irritable bowel syndrome)    IDA (iron  deficiency anemia)    a.) post 2 unit txfsn 2009, normal endo/colonoscopy by [facility]   Left bundle branch block (LBBB)    a.) Intermittently present - likely rate related.   NSVT (nonsustained ventricular tachycardia) (HCC) 11/15/2020   a.) Holter study 11/15/2020; 11 episodes of NSVT lasting up to 8 beats with a maximum rate of 210 bpm; 14 atrial runs lasting up to 18 beats with a rate of up to 250 bpm.  Some atrial runs felt to be NSVT.   NSVT (nonsustained ventricular tachycardia) (HCC)    a. 10/2020 [facility]: 11 runs of NSVT up to 8 beats.   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 10/2020 [facility]: 14 atrial runs up to 18 beats, max HR 250.   Restless leg syndrome    Rotator cuff arthropathy, right 08/13/2017   Syncope and collapse 03/12/2014   Type II diabetes mellitus (HCC)     Family History  Problem Relation Age of Onset   Heart disease Father    Hypertension Father    Prostate cancer Father     Stroke Father    Osteoporosis Father    Stroke Mother    Depression Mother    Headache Mother    Heart disease Mother    Thyroid  disease Mother    Hypertension Mother    Diabetes Daughter    Heart disease Daughter    Hypertension Daughter    Hypertension Son    Past Surgical History:  Procedure Laterality Date   APPENDECTOMY  BACK SURGERY     CARDIOVASCULAR STRESS TEST     CARDIOVERSION N/A 10/30/2022   Procedure: CARDIOVERSION;  Surgeon: Mady Bruckner, MD;  Location: [facility];  Service: Cardiovascular;  Laterality: N/A;   CHOLECYSTECTOMY N/A 1976   COLONOSCOPY     CORONARY ANGIOPLASTY     CORONARY STENT INTERVENTION N/A 01/04/2020   Procedure: CORONARY STENT INTERVENTION;  Surgeon: Darron Deatrice LABOR, MD;  Location: [facility];  Service: Cardiovascular;  Laterality: N/A;  LAD    DIAPHRAGMATIC HERNIA REPAIR  2015   ESOPHAGEAL DILATION     multiple   ESOPHAGOGASTRODUODENOSCOPY (EGD) WITH PROPOFOL  N/A 05/11/2021   Procedure: ESOPHAGOGASTRODUODENOSCOPY (EGD) WITH PROPOFOL ;  Surgeon: Jinny Carmine, MD;  Location: [facility];  Service: Endoscopy;  Laterality: N/A;   ESOPHAGOGASTRODUODENOSCOPY (EGD) WITH PROPOFOL  N/A 06/13/2021   Procedure: ESOPHAGOGASTRODUODENOSCOPY (EGD) WITH PROPOFOL ;  Surgeon: Jinny Carmine, MD;  Location: [facility];  Service: Endoscopy;  Laterality: N/A;   ESOPHAGOGASTRODUODENOSCOPY (EGD) WITH PROPOFOL  N/A 02/28/2023   Procedure: ESOPHAGOGASTRODUODENOSCOPY (EGD) WITH PROPOFOL ;  Surgeon: Jinny Carmine, MD;  Location: [facility];  Service: Endoscopy;  Laterality: N/A;   EYE SURGERY     GASTRIC BYPASS  2000, 2005   Dr. Mia REDBIRD IMPLANT PLACEMENT  10/2011   Ike REDBIRD IMPLANT PLACEMENT N/A 09/18/2021   Procedure: REDBIRD IMPLANT FIRST STAGE;  Surgeon: Gaston Hamilton, MD;  Location: [facility];  Service: Urology;  Laterality: N/A;   INTERSTIM IMPLANT PLACEMENT N/A 09/18/2021   Procedure: REDBIRD IMPLANT SECOND  STAGE WITH IMPEDENCE CHECK;  Surgeon: Gaston Hamilton, MD;  Location: [facility];  Service: Urology;  Laterality: N/A;   INTERSTIM IMPLANT REMOVAL N/A 09/18/2021   Procedure: REMOVAL OF INTERSTIM IMPLANT;  Surgeon: Gaston Hamilton, MD;  Location: [facility];  Service: Urology;  Laterality: N/A;   LEFT HEART CATH AND CORONARY ANGIOGRAPHY N/A 04/1986   LEFT HEART CATH AND CORONARY ANGIOGRAPHY Left 07/24/2012   LEFT HEART CATH AND CORS/GRAFTS ANGIOGRAPHY N/A 12/31/2019   Procedure: LEFT HEART CATH AND CORS/GRAFTS ANGIOGRAPHY;  Surgeon: Perla Evalene PARAS, MD;  Location: [facility];  Service: Cardiovascular;  Laterality: N/A;   PANNICULECTOMY N/A 06/16/2019   Procedure: PANNICULECTOMY;  Surgeon: Elisabeth Craig RAMAN, MD;  Location: [facility];  Service: Plastics;  Laterality: N/A;  3 hours, please   REVERSE SHOULDER ARTHROPLASTY Right 08/13/2017   Procedure: REVERSE RIGHT SHOULDER ARTHROPLASTY;  Surgeon: Josefina Chew, MD;  Location: [facility];  Service: Orthopedics;  Laterality: Right;   RIGHT/LEFT HEART CATH AND CORONARY ANGIOGRAPHY N/A 09/06/2020   Procedure: RIGHT/LEFT HEART CATH AND CORONARY ANGIOGRAPHY;  Surgeon: Mady Bruckner, MD;  Location: [facility];  Service: Cardiovascular;  Laterality: N/A;   RIGHT/LEFT HEART CATH AND CORONARY ANGIOGRAPHY N/A 06/04/2022   Procedure: RIGHT/LEFT HEART CATH AND CORONARY ANGIOGRAPHY;  Surgeon: Mady Bruckner, MD;  Location: [facility];  Service: Cardiovascular;  Laterality: N/A;   ROTATOR CUFF REPAIR Right    SPINE SURGERY  1995   [place]   TOOTH EXTRACTION  11/13/2021   TOTAL ABDOMINAL HYSTERECTOMY W/ BILATERAL SALPINGOOPHORECTOMY  1974   TOTAL KNEE ARTHROPLASTY Bilateral 2007   Procedure: TOTAL KNEE ARTHROPLASTY; Surgeons: Kathi, MD and [name], MD   UMBILICAL HERNIA REPAIR  08/11/2015   Social History   Social History Narrative   Patient lives at home with husband Koren.    Right Handed   Drinks caffeinated tea occasionally    One pet in house, dog   Immunization History  Administered Date(s) Administered  Fluad Quad(high Dose 65+) 03/06/2019, 06/07/2020, 07/27/2021, 07/13/2022   Fluad Trivalent(High Dose 65+) 03/29/2023   Influenza Split 06/13/2011   Influenza, High Dose Seasonal PF 08/21/2018   Influenza,inj,Quad PF,6+ Mos 05/04/2014, 05/12/2015   Influenza-Unspecified 04/15/2012   Moderna Sars-Covid-2 Vaccination 02/02/2020, 03/01/2020, 09/01/2020   Pneumococcal Conjugate-13 08/11/2013, 01/06/2014   Pneumococcal Polysaccharide-23 01/19/2010, 09/03/2018   Tdap 04/18/2016, 11/20/2020     Objective: Vital Signs: There were no vitals taken for this visit.   Physical Exam   Musculoskeletal Exam: ***  CDAI Exam: CDAI Score: -- Patient Global: --; Provider Global: -- Swollen: --; Tender: -- Joint Exam 03/04/2024   No joint exam has been documented for this visit   There is currently no information documented on the homunculus. Go to the Rheumatology activity and complete the homunculus joint exam.  Investigation: No additional findings.  Imaging: DG Foot Complete Left Result Date: 03/03/2024 CLINICAL DATA:  Cellulitis EXAM: LEFT FOOT - COMPLETE 3+ VIEW COMPARISON:  CT 01/24/2024, radiograph 11/27/2023 FINDINGS: Vascular calcifications and generalized soft tissue swelling. Prior partial resection of the fifth metatarsal. Chronic irregular arthropathy at the second third and fourth MTP joints. Advanced osseous destructive change and fragmentation of the midfoot, consistent with Charcot arthropathy, suspect that there is progressive erosive change and osseous destruction of the calcaneus, talus, cuboid navicular and cuneiform bones compared with the radiograph from May as there appears to be progressive subtalar collapse. There is increased effusion and soft tissue swelling compared with exam from May. Moderate plantar calcaneal spur. IMPRESSION: 1. Advanced osseous destructive change and fragmentation of  the midfoot, consistent with Charcot arthropathy, suspect that there is progressive erosive change and osseous destruction of the calcaneus, talus, cuboid navicular and cuneiform bones compared with the radiograph from May. There is increased effusion and soft tissue swelling compared with exam from May. Findings are concerning for progressive Charcot arthropathy versus superimposed osteomyelitis. 2. Prior partial resection of the fifth metatarsal. Chronic irregular arthropathy at the second third and fourth MTP joints. Chronic degenerative change at the first IP and MTP joints. Electronically Signed   By: Luke Bun M.D.   On: 03/03/2024 19:37    Recent Labs: Lab Results  Component Value Date   WBC 15.0 (H) 03/03/2024   HGB 12.5 03/03/2024   PLT 421 (H) 03/03/2024   NA 138 03/03/2024   K 3.8 03/03/2024   CL 105 03/03/2024   CO2 22 03/03/2024   GLUCOSE 271 (H) 03/03/2024   BUN 11 03/03/2024   CREATININE 1.08 (H) 03/03/2024   BILITOT 0.7 03/03/2024   ALKPHOS 165 (H) 03/03/2024   AST 18 03/03/2024   ALT 14 03/03/2024   PROT 6.9 03/03/2024   ALBUMIN  2.9 (L) 03/03/2024   CALCIUM  8.6 (L) 03/03/2024   GFRAA >60 03/23/2020    Speciality Comments: Immediate meds- 30 day to [facility] Long term meds- [facility]  Procedures:  No procedures performed Allergies: Amoxicillin , Clarithromycin , Demeclocycline, Erythromycin, Flagyl [metronidazole], Glucophage [metformin hcl], Tetracyclines & related, Potassium chloride , Tetracycline, Diovan [valsartan], Sulfa  antibiotics, and Xanax [alprazolam]   Assessment / Plan:     Visit Diagnoses: No diagnosis found.  ***  Orders: No orders of the defined types were placed in this encounter.  No orders of the defined types were placed in this encounter.    Follow-Up Instructions: No follow-ups on file.   Lonni LELON Ester, MD  Note - This record has been created using [employer].  Chart creation errors have been sought, but may not always  have  been located. Such creation errors do not reflect on  the standard of medical care.

## 2024-03-04 NOTE — TOC Initial Note (Signed)
 Transition of Care [facility]) - Initial/Assessment Note    Patient Details  Name: Denise Sharp MRN: [id number] Date of Birth: 1946/03/16  Transition of Care [facility]) CM/SW Contact:    Alfonso Rummer, LCSW Phone Number: 03/04/2024, 10:14 AM  Clinical Narrative:     LCSW A. Rummer met with patient in room 118. Ms. Shvartsman was engaged, pleasant and did not appear in any discomfort or distress during TOC assessment. Pt reports was discharged from [facility] 02/22/2024. Pt reports Dr. Verneita Kettering is pcp, hx of home health for pt and ot. Pt lives with disabled spouse and two children reside in [place]. Pt reports she still drives. Pt reports she has walkers, canes, bedside commode, front door ramp, scooter, dress and sock stick.               Expected Discharge Plan:  (TBD) Barriers to Discharge: Continued Medical Work up   Patient Goals and CMS Choice    Patient goal progress to baseline         Expected Discharge Plan and Services       Living arrangements for the past 2 months: Skilled Nursing Facility                                      Prior Living Arrangements/Services Living arrangements for the past 2 months: Skilled Nursing Facility Lives with:: Self, Spouse   Do you feel safe going back to the place where you live?: Yes        Care giver support system in place?: Yes (comment)      Activities of Daily Living   ADL Screening (condition at time of admission) Independently performs ADLs?: No Does the patient have a NEW difficulty with bathing/dressing/toileting/self-feeding that is expected to last >3 days?: Yes (Initiates electronic notice to provider for possible OT consult) Does the patient have a NEW difficulty with getting in/out of bed, walking, or climbing stairs that is expected to last >3 days?: No Does the patient have a NEW difficulty with communication that is expected to last >3 days?: No Is the patient deaf or have difficulty  hearing?: No Does the patient have difficulty seeing, even when wearing glasses/contacts?: No Does the patient have difficulty concentrating, remembering, or making decisions?: No  Permission Sought/Granted                  Emotional Assessment Appearance:: Appears stated age Attitude/Demeanor/Rapport: Engaged Affect (typically observed): Appropriate Orientation: : Oriented to Self, Oriented to Place, Oriented to  Time, Oriented to Situation      Admission diagnosis:  Cellulitis of left foot [L03.116] Patient Active Problem List   Diagnosis Date Noted   Cellulitis of left foot 03/03/2024   SIRS (systemic inflammatory response syndrome) (HCC) 03/03/2024   History of gout 03/03/2024   Charcot joint of left foot 02/02/2024   Cellulitis 01/31/2024   Gout of left ankle 01/31/2024   Acute gout due to renal impairment involving toe of left foot 12/19/2023   Foot cramps 12/19/2023   Pain in left ankle and joints of left foot 12/19/2023   Osteoarthritis of left foot 12/19/2023   Gouty arthritis of hand 10/10/2023   Hematochezia 05/15/2023   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   Persistent atrial fibrillation (HCC) 09/27/2022   Hypoglycemia due to insulin  09/24/2022   COVID-19 virus infection 09/01/2022   Abdominal bloating 07/25/2022   Cerebral vascular disease  06/11/2022   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   Orthostatic dizziness 04/03/2022   Gait abnormality 04/03/2022   Hx of amaurosis fugax 02/06/2022   Protein-calorie malnutrition (HCC) 11/21/2021   Absolute anemia 10/19/2021   Chronic neck pain 09/12/2021   PAC (premature atrial contraction) 08/19/2021       08/03/2021   Stricture and stenosis of esophagus    Pain in joint of right knee 05/17/2021   Sprain of right ankle 05/17/2021   Sprain of right knee 05/17/2021   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/09/2021   PSVT (paroxysmal supraventricular tachycardia) (HCC) 12/15/2020   CKD stage  3 due to type 2 diabetes mellitus (HCC) 12/09/2020   Other pulmonary embolism with acute cor pulmonale, unspecified chronicity (HCC) 11/17/2020   Mild episode of recurrent major depressive disorder (HCC) 11/17/2020   Hyperlipidemia 08/21/2020   Iron  deficiency 06/28/2020   Elevated alkaline phosphatase level 06/10/2020   Chronic heart failure with preserved ejection fraction (HFpEF) (HCC) 01/14/2020   Coronary artery disease of native artery of native heart with stable angina pectoris (HCC) 01/13/2020   S/P cardiac catheterization 01/13/2020   Accelerating angina (HCC) 12/31/2019   Dysphagia 11/30/2019   Bladder pain 08/23/2019   Gastritis 08/07/2019   Lab test negative for COVID-19 virus 08/07/2019   S/P panniculectomy 06/16/2019   Major depressive disorder with current active episode 05/30/2019   Functional diarrhea 10/22/2018   Bilateral cataracts 06/26/2018   History of gastric bypass 02/05/2018   GAD (generalized anxiety disorder) 02/01/2018   Rectocele 01/24/2018   Vaginal prolapse 01/04/2018   Hepatic steatosis 01/04/2018   Microalbuminuria due to type 2 diabetes mellitus (HCC) 11/21/2017   Hospital discharge follow-up 09/22/2017   Fecal incontinence 09/22/2017   B12 deficiency 09/22/2017   Rotator cuff arthropathy, right 08/13/2017   Primary localized osteoarthrosis of shoulder 08/13/2017   Encounter for preoperative examination for general surgical procedure 08/03/2017   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) 08/03/2017   Candidiasis, intertrigo 08/03/2017   Venous stasis dermatitis of right lower extremity 11/21/2016   Chronic anxiety 10/11/2015   Hypersomnia, persistent 06/18/2015   Mechanical breakdown of implanted electronic neurostimulator of peripheral nerve (HCC) 10/16/2014   Obesity hypoventilation syndrome (HCC) 08/13/2014   Frequent falls 06/23/2014   Chronic cough 05/05/2014   Adenoma of left adrenal gland 03/24/2014   Chronic  intractable headache 03/23/2014   Syncope and collapse 03/12/2014   Vitamin D  deficiency 01/06/2014   Rectal bleeding 12/03/2013   Weight gain following gastric bypass surgery 12/03/2013   Morbid obesity (HCC) 08/25/2013   Weakness 08/04/2013   Diaphragmatic hernia 04/06/2013   Diabetic polyneuropathy (HCC) 03/25/2013   Abnormality of gait 03/25/2013   Multiple pulmonary nodules 03/20/2013   DOE (dyspnea on exertion) 03/20/2013   Iron  deficiency anemia 08/06/2012   Functional disorder of bladder 07/15/2012   Urge incontinence 07/15/2012   Detrusor overactivity 07/15/2012   OSA on CPAP 03/02/2012   Insomnia secondary to anxiety 02/29/2012   Sciatica 09/05/2011   Cervical stenosis of spinal canal 06/14/2011   Restless legs syndrome 03/13/2011   Type II diabetes mellitus with neurological manifestations (HCC) 03/12/2011   Hearing loss, right 03/12/2011   PCP:  Marylynn Verneita CROME, MD Pharmacy:   [facility] [phone] [address], [place] - [address] [place] [address] Phone: [phone] Fax: [phone]  [facility] - [place], [place] - [address] [place] [place] [phone] Phone: [phone] Fax: [phone]  Social Drivers of Health (SDOH) Social History: SDOH Screenings   Food Insecurity: No Food Insecurity (02/01/2024)  Housing: Low Risk  (02/01/2024)  Transportation Needs: No Transportation Needs (02/01/2024)  Utilities: Not At Risk (02/01/2024)  Depression (PHQ2-9): High Risk (01/09/2024)  Financial Resource Strain: Low Risk  (09/15/2021)  Physical Activity: Insufficiently Active (11/15/2020)  Social Connections: Unknown (02/01/2024)  Stress: Stress Concern Present (08/03/2020)  Tobacco Use: Low Risk  (03/03/2024)   SDOH Interventions:     Readmission Risk Interventions     No data to display

## 2024-03-04 NOTE — Evaluation (Signed)
 Occupational Therapy Evaluation Patient Details Name: Denise Sharp MRN: [id number] DOB: Feb 23, 1946 Today's Date: 03/04/2024   History of Present Illness   Pt is a 78 year old with hypertension, hyperlipidemia, CAD, anxiety, asthma, CKD, who presents from urology for chief concern of worsening wounds of the left foot.  Dx: Cellulitis of left foot, SIRS (systemic inflammatory response syndrome), Persistent atrial fibrillation     Clinical Impressions Pt was seen for OT evaluation this date. Prior to hospital admission, pt was able to complete UB/LB dressing tasks with Mod I while seated/standing (spouse provided assistance for donning socks/shoes), Mod I/Supervision with seated showers, Incontinent of bowel/bladder however was SBA to Min A with use of toileting aide. Pt lives at home with spouse who provided minimal assistance for self cares, was at [facility] level for very short household distances (per pt was no longer able to ambulate at [facility] level from bedroom to living room). Pt presents with deficits in functional BUE strength, increased pain in L hand (rated 5/10 at rest and reported the pain increased with use) and reports significant pain in L foot when attempting to use, decreased activity tolerance, all affecting safe and optimal ADL completion. Pt currently refuses any attempts at engaging in ADLs while sitting EOB or OOB activity even with education on benefits on overall conditioning due to concerns over increased pain, discomfort, and feeling generally weak and unwell, stating, I will not be getting out of bed today and I will not do that today when asked to attempt. At this time, pt requires extensive assistance for LB ADLs and toileting attempts due to refusals, SBA for grooming tasks, and Mod A for simulated UB ADLs at bed level. Pt would benefit from skilled OT services to address noted impairments and functional limitations (see below for any additional details) in order to maximize  safety and independence while minimizing future risk of falls, injury, and readmission. Anticipate the need for follow up OT services upon acute hospital DC.      If plan is discharge home, recommend the following:   A lot of help with bathing/dressing/bathroom;A lot of help with walking and/or transfers;Help with stairs or ramp for entrance;Assistance with cooking/housework     Functional Status Assessment   Patient has had a recent decline in their functional status and demonstrates the ability to make significant improvements in function in a reasonable and predictable amount of time.     Equipment Recommendations         Recommendations for Other Services         Precautions/Restrictions   Precautions Precautions: Fall Recall of Precautions/Restrictions: Intact     Mobility Bed Mobility Overal bed mobility: Needs Assistance Bed Mobility: Rolling Rolling: Max assist, Used rails         General bed mobility comments: Pt refused to attempt bed mobility on this date reporting that her L hand is 5/10 pain.    Transfers Overall transfer level: Needs assistance                 General transfer comment: Pt refused any OOB or EOB activities during evaluation. When asked to attempt at EOB pt reported I will not do that today.      Balance Overall balance assessment: Needs assistance     Sitting balance - Comments: pt refused any OOB or EOB activities during evaluation. When asked to attempt at EOB pt reported I will not do that today.     Standing balance-Leahy Scale: Zero Standing  balance comment: pt refused any OOB or EOB activities during evaluation. When asked to attempt at EOB pt reported I will not do that today.                           ADL either performed or assessed with clinical judgement   ADL Overall ADL's : Needs assistance/impaired Eating/Feeding: Set up;Bed level   Grooming: Wash/dry face;Brushing hair;Bed  level Grooming Details (indicate cue type and reason): simulated - pt refused any OOB or EOB activities during evaluation.  When asked to attempt at EOB pt reported I will not do that today. Upper Body Bathing: Moderate assistance;Bed level Upper Body Bathing Details (indicate cue type and reason): simulated - pt refused any OOB or EOB activities during evaluation. When asked to attempt at EOB pt reported I will not do that today. Lower Body Bathing: Maximal assistance;Bed level Lower Body Bathing Details (indicate cue type and reason): pt refused any OOB or EOB activities during evaluation. When asked to attempt at EOB pt reported I will not do that today. Upper Body Dressing : Moderate assistance;Bed level Upper Body Dressing Details (indicate cue type and reason): simulated - pt refused any OOB or EOB activities during evaluation. When asked to attempt at EOB pt reported I will not do that today. Lower Body Dressing: Total assistance;Bed level Lower Body Dressing Details (indicate cue type and reason): pt refused any OOB or EOB activities during evaluation. When asked to attempt at EOB pt reported I will not do that today. Toilet Transfer: Total assistance Toilet Transfer Details (indicate cue type and reason): pt refused any OOB or EOB activities during evaluation. When asked to attempt at EOB pt reported I will not do that today. Toileting- Clothing Manipulation and Hygiene: Bed level;Total assistance Toileting - Clothing Manipulation Details (indicate cue type and reason): pt refused any OOB or EOB activities during evaluation. When asked to attempt at EOB pt reported I will not do that today.     Functional mobility during ADLs: Total assistance General ADL Comments: pt refused any OOB or EOB activities during evaluation. When asked to attempt at EOB pt reported I will not do that today.     Vision Patient Visual Report: No change from baseline       Perception          Praxis         Pertinent Vitals/Pain Pain Assessment Pain Assessment: 0-10 Pain Score: 5  Pain Location: L hand Pain Descriptors / Indicators: Guarding, Grimacing Pain Intervention(s): Limited activity within patient's tolerance, Monitored during session     Extremity/Trunk Assessment Upper Extremity Assessment Upper Extremity Assessment: Generalized weakness   Lower Extremity Assessment Lower Extremity Assessment: Defer to PT evaluation       Communication Communication Communication: No apparent difficulties   Cognition Arousal: Alert Behavior During Therapy: WFL for tasks assessed/performed Cognition: No apparent impairments                               Following commands: Intact       Cueing  General Comments   Cueing Techniques: Verbal cues      Exercises Other Exercises Other Exercises: Education on role of OT in Acute setting, education on benefits of engaging in bed mobility to support caregivers during bed level ADLs, education on benefits of sitting EOB or getting into recliner chair to support overall conditioning  Other Exercises: UE AROM in all planes   Shoulder Instructions      Home Living Family/patient expects to be discharged to:: Private residence Living Arrangements: Spouse/significant other Available Help at Discharge: Family;Available 24 hours/day Type of Home: House Home Access: Ramped entrance     Home Layout: One level     Bathroom Shower/Tub: Producer, television/film/video: Handicapped height Bathroom Accessibility: Yes How Accessible: Accessible via walker Home Equipment: Rolling Walker (2 wheels);Electric scooter;Shower seat;Cane - single point;Rollator (4 wheels);Shower seat - built in;Grab bars - toilet;Grab bars - tub/shower   Additional Comments: Pt reports having a toileting aid.   Pt reports that she has attempted to get a power wheelchair.      Prior Functioning/Environment Prior Level of  Function : Needs assist             Mobility Comments: Pt reports that prior to most recent acute admission, she was only able to ambulate very short household distances (unable to ambulate from bedroom to living room). ADLs Comments: Pt reports she was at [facility] for only    OT Problem List: Decreased strength;Decreased activity tolerance;Decreased safety awareness;Impaired balance (sitting and/or standing)   OT Treatment/Interventions: Self-care/ADL training;Therapeutic exercise;Therapeutic activities;Energy conservation;Patient/family education;DME and/or AE instruction;Balance training      OT Goals(Current goals can be found in the care plan section)   Acute Rehab OT Goals Patient Stated Goal: Plan to return home OT Goal Formulation: With patient Time For Goal Achievement: 03/18/24 Potential to Achieve Goals: Fair ADL Goals Pt Will Perform Grooming: with supervision;sitting Pt Will Perform Upper Body Dressing: with supervision;sitting Pt Will Perform Lower Body Dressing: with min assist;sit to/from stand Pt Will Transfer to Toilet: with min assist;bedside commode Pt Will Perform Toileting - Clothing Manipulation and hygiene: with min assist;sit to/from stand   OT Frequency:  Min 2X/week    Co-evaluation              AM-PAC OT 6 Clicks Daily Activity     Outcome Measure Help from another person eating meals?: None Help from another person taking care of personal grooming?: A Little Help from another person toileting, which includes using toliet, bedpan, or urinal?: Total Help from another person bathing (including washing, rinsing, drying)?: A Lot Help from another person to put on and taking off regular upper body clothing?: A Lot Help from another person to put on and taking off regular lower body clothing?: Total 6 Click Score: 13   End of Session Nurse Communication: Mobility status  Activity Tolerance: Patient limited by pain;Other (comment) Patient left:  in bed;with call bell/phone within reach;with bed alarm set  OT Visit Diagnosis: Unsteadiness on feet (R26.81);Muscle weakness (generalized) (M62.81)                Time: 9167-9145 OT Time Calculation (min): 22 min Charges:  OT General Charges $OT Visit: 1 Visit OT Evaluation $OT Eval Low Complexity: 1 Low OT Treatments $Self Care/Home Management : 8-22 mins  Harlene Sharps OTR/L   Harlene LITTIE Sharps 03/04/2024, 10:11 AM

## 2024-03-05 ENCOUNTER — Inpatient Hospital Stay

## 2024-03-05 ENCOUNTER — Encounter: Admitting: Family

## 2024-03-05 DIAGNOSIS — M109 Gout, unspecified: Secondary | ICD-10-CM | POA: Diagnosis not present

## 2024-03-05 DIAGNOSIS — L03116 Cellulitis of left lower limb: Secondary | ICD-10-CM | POA: Diagnosis not present

## 2024-03-05 LAB — GLUCOSE, CAPILLARY
Glucose-Capillary: 109 mg/dL — ABNORMAL HIGH (ref 70–99)
Glucose-Capillary: 120 mg/dL — ABNORMAL HIGH (ref 70–99)
Glucose-Capillary: 97 mg/dL (ref 70–99)
Glucose-Capillary: 192 mg/dL — ABNORMAL HIGH (ref 70–99)

## 2024-03-05 LAB — BASIC METABOLIC PANEL WITH GFR
Anion gap: 8 (ref 5–15)
BUN: 21 mg/dL (ref 8–23)
CO2: 23 mmol/L (ref 22–32)
Calcium: 7.9 mg/dL — ABNORMAL LOW (ref 8.9–10.3)
Chloride: 106 mmol/L (ref 98–111)
Creatinine, Ser: 1.22 mg/dL — ABNORMAL HIGH (ref 0.44–1.00)
GFR, Estimated: 45 mL/min — ABNORMAL LOW (ref >60)
Glucose, Bld: 94 mg/dL (ref 70–99)
Potassium: 4 mmol/L (ref 3.5–5.1)
Sodium: 137 mmol/L (ref 135–145)

## 2024-03-05 LAB — CBC
HCT: 32.2 % — ABNORMAL LOW (ref 36.0–46.0)
Hemoglobin: 10 g/dL — ABNORMAL LOW (ref 12.0–15.0)
MCH: 28.6 pg (ref 26.0–34.0)
MCHC: 31.1 g/dL (ref 30.0–36.0)
MCV: 92 fL (ref 80.0–100.0)
Platelets: 346 K/uL (ref 150–400)
RBC: 3.5 MIL/uL — ABNORMAL LOW (ref 3.87–5.11)
RDW: 16 % — ABNORMAL HIGH (ref 11.5–15.5)
WBC: 12 K/uL — ABNORMAL HIGH (ref 4.0–10.5)
nRBC: 0 % (ref 0.0–0.2)

## 2024-03-05 MED ORDER — VANCOMYCIN HCL IN DEXTROSE 1-5 GM/200ML-% IV SOLN
1000.0000 mg | INTRAVENOUS | Status: DC
  Administered 2024-03-05 – 2024-03-07 (×3): 1000 mg via INTRAVENOUS
  Filled 2024-03-05 (×3): qty 200

## 2024-03-05 MED ORDER — SODIUM CHLORIDE 0.9 % IV SOLN
2.0000 g | Freq: Two times a day (BID) | INTRAVENOUS | Status: DC
  Administered 2024-03-06 – 2024-03-08 (×5): 2 g via INTRAVENOUS
  Filled 2024-03-05 (×6): qty 12.5

## 2024-03-05 MED ORDER — SODIUM CHLORIDE 0.9 % IV SOLN
2.0000 g | Freq: Two times a day (BID) | INTRAVENOUS | Status: DC
  Administered 2024-03-05: 2 g via INTRAVENOUS
  Filled 2024-03-05 (×2): qty 12.5

## 2024-03-05 MED ORDER — ALBUMIN HUMAN 25 % IV SOLN
25.0000 g | Freq: Once | INTRAVENOUS | Status: AC
  Administered 2024-03-05: 25 g via INTRAVENOUS
  Filled 2024-03-05: qty 100

## 2024-03-05 MED ORDER — ALBUMIN HUMAN 25 % IV SOLN
12.5000 g | Freq: Once | INTRAVENOUS | Status: AC
  Administered 2024-03-05: 12.5 g via INTRAVENOUS
  Filled 2024-03-05: qty 50

## 2024-03-05 NOTE — Plan of Care (Signed)
  Problem: Fluid Volume: Goal: Ability to maintain a balanced intake and output will improve Outcome: Progressing   Problem: Health Behavior/Discharge Planning: Goal: Ability to identify and utilize available resources and services will improve Outcome: Progressing Goal: Ability to manage health-related needs will improve Outcome: Progressing   Problem: Metabolic: Goal: Ability to maintain appropriate glucose levels will improve Outcome: Progressing   Problem: Nutritional: Goal: Maintenance of adequate nutrition will improve Outcome: Progressing   Problem: Skin Integrity: Goal: Risk for impaired skin integrity will decrease Outcome: Progressing   Problem: Tissue Perfusion: Goal: Adequacy of tissue perfusion will improve Outcome: Progressing   Problem: Clinical Measurements: Goal: Will remain free from infection Outcome: Progressing

## 2024-03-05 NOTE — Consult Note (Signed)
 PODIATRY CONSULTATION  NAME Denise Sharp MRN [id number] DOB 01-07-1946 DOA 03/03/2024   Reason for consult: LT foot osteomyelitis/cellulitis Chief Complaint  Patient presents with   Wound Infection    History of present illness: 78 y.o. female PMHx HTN, CAD, CKD, diabetes mellitus, last A1c was 7.6 on 01/07/2024, admitted for persistent fever with worsening wound to the left foot.  History of Charcot neuroarthropathy although she reports that she was diagnosed by multiple physicians previously for gout.  Past Medical History:  Diagnosis Date   (HFpEF) heart failure with preserved ejection fraction (HCC)    a. 12/2019 Echo: EF 65-70%, Gr2 DD. No significant valvular dzs.   Abnormality of gait 03/25/2013   Adrenal mass, left (HCC)    Anginal pain (HCC)    Anxiety    Aortic atherosclerosis (HCC)    Arthritis    Asthma    CAD (coronary artery disease) with h/o Atypical Chest Pain    a. 04/1986 Cath ([facility]): nl cors, EF 65%; b. 07/2012 Cath [facility]): Diff minor irregs; c. 07/2016 MV [facility]): Equivocal; d. 08/2016 Cardiac CT Ca2+ score [facility]): Ca2+ 1548; e. 05/2019 [facility]: No ischemia. EF 75%; f. 12/2019 PCI: LM nl, LAD 65ost (3.5x12 Resolute DES), 47m (2.75x12 Resolute DES),LCX/RCA nl; g. 08/2020 Cath: patent LAD stents, RCA 40ost, elev RH pressures, EF >65%.   Cervical spinal stenosis 1994   due to trauma to back ([employer] accident), has intermittent paralysis and parasthesias   Cervicogenic headache 03/23/2014   CKD (chronic kidney disease), stage III (HCC)    Depression    Diverticulosis    Dizziness    a.) chronic   DJD (degenerative joint disease)    a. Chronic R shoulder pain   Dyspnea    Esophageal stenosis 09/2009   a.) transient outlet obstruction by food, cleared by EGD   Family history of adverse reaction to anesthesia    a.) daughter with (+) PONV   Gastric bypass status for obesity    GERD (gastroesophageal reflux disease)    Gout    Headache(784.0)    HLD  (hyperlipidemia)    Hypertension    IBS (irritable bowel syndrome)    IDA (iron  deficiency anemia)    a.) post 2 unit txfsn 2009, normal endo/colonoscopy by [facility]   Left bundle branch block (LBBB)    a.) Intermittently present - likely rate related.   NSVT (nonsustained ventricular tachycardia) (HCC) 11/15/2020   a.) Holter study 11/15/2020; 11 episodes of NSVT lasting up to 8 beats with a maximum rate of 210 bpm; 14 atrial runs lasting up to 18 beats with a rate of up to 250 bpm.  Some atrial runs felt to be NSVT.   NSVT (nonsustained ventricular tachycardia) (HCC)    a. 10/2020 [facility]: 11 runs of NSVT up to 8 beats.   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 10/2020 [facility]: 14 atrial runs up to 18 beats, max HR 250.   Restless leg syndrome    Rotator cuff arthropathy, right 08/13/2017   Syncope and collapse 03/12/2014   Type II diabetes mellitus (HCC)        Latest Ref Rng & Units 03/05/2024    4:32 AM 03/04/2024    5:48 AM 03/03/2024    2:52 PM  CBC  WBC 4.0 - 10.5 K/uL 12.0  15.1  15.0  Hemoglobin 12.0 - 15.0 g/dL 89.9  89.6  87.4   Hematocrit 36.0 - 46.0 % 32.2  32.8  40.7   Platelets 150 - 400 K/uL 346  366  421        Latest Ref Rng & Units 03/05/2024    4:32 AM 03/04/2024    5:48 AM 03/03/2024    2:52 PM  BMP  Glucose 70 - 99 mg/dL 94  867  728   BUN 8 - 23 mg/dL 21  16  11    Creatinine 0.44 - 1.00 mg/dL 8.77  8.82  8.91   Sodium 135 - 145 mmol/L 137  141  138   Potassium 3.5 - 5.1 mmol/L 4.0  4.1  3.8   Chloride 98 - 111 mmol/L 106  109  105   CO2 22 - 32 mmol/L 23  23  22    Calcium  8.9 - 10.3 mg/dL 7.9  8.2  8.6     LT foot 03/03/2024   Physical Exam: General: The patient is alert and oriented x3 in no acute distress.   Dermatology: Ulcer noted left lateral foot secondary to irritation from cam boot.  Surrounding  erythema and edema noted.  Vascular: Skin is warm to touch.  Heavy erythema and edema noted around the left foot and ankle.  Capillary refill WNL  Neurological: Light touch and protective threshold diminished bilaterally.   Musculoskeletal Exam: Advanced severe diabetic Charcot neuroarthropathy LLE  DG Foot Complete Left 03/03/2024 IMPRESSION: 1. Advanced osseous destructive change and fragmentation of the midfoot, consistent with Charcot arthropathy, suspect that there is progressive erosive change and osseous destruction of the calcaneus, talus, cuboid navicular and cuneiform bones compared with the radiograph from May. There is increased effusion and soft tissue swelling compared with exam from May. Findings are concerning for progressive Charcot arthropathy versus superimposed osteomyelitis. 2. Prior partial resection of the fifth metatarsal. Chronic irregular arthropathy at the second third and fourth MTP joints. Chronic degenerative change at the first IP and MTP joints.  CT FOOT LEFT WO CONTRAST 01/24/2024 IMPRESSION: 1. No definite acute osseous findings. 2. Severe chronic Charcot changes throughout the midfoot with chronic fragmentation and destruction of the cuneiform bones, cuboid, navicular, talus and calcaneus. 3. 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 4. Generalized soft tissue swelling with diffuse synovial thickening throughout the hindfoot and midfoot. No organized fluid collection, foreign body or soft tissue emphysema identified. 5. Stable postsurgical changes from remote resection of the distal 5th metatarsal. 6. Stable degenerative changes at the 1st and 3rd metatarsophalangeal joints.  MR FOOT LEFT WO CONTRAST 03/04/2024 IMPRESSION: 1. Severe Charcot changes within the hindfoot and midfoot with chronic dorsal and medial dislocation at the transverse tarsal joint and new, near complete medial dislocation at the subtalar  joint. 2. Nonspecific patchy T2 hyperintensity throughout the talus without gross bone destruction. This could be reactive or secondary to early osteomyelitis. 3. Complex ankle and subtalar joint effusions. 4. Longitudinal split tear of the peroneus brevis tendon. 5. Generalized subcutaneous edema with possible skin ulceration laterally in the hindfoot. Correlate clinically. No evidence of soft tissue abscess.   ASSESSMENT/PLAN OF CARE 1.  Advanced severe Charcot with osseous destructive changes complicated by surrounding cellulitis and possible osteomyelitis  -Patient seen at bedside this evening with her husband present -Today we had a very long detailed discussion regarding the patient's current pathology and situation regarding the foot.  Given the severity of the Charcot neuroarthropathy complicated by possible cellulitis with underlying  osteomyelitis, I do believe that below-knee amputation would be in the best interest for the patient.  We had a long discussion regarding salvage versus amputation and although they were not expecting this conversation she is amenable and open to the idea of below-knee amputation.  I do believe this will be in her best interest long-term and to continue with the best possible quality of life -I informed her that I will consult vascular to further assess and discuss possible below-knee amputation -Hospitalist notified as well -Podiatry will sign off.   Thank you for the consult.  Please contact me directly via secure chat with any questions or concerns.     Thresa EMERSON Sar, DPM [facility]  Dr. Thresa EMERSON Sar, DPM    [address] [place], [place] [address]                Office [phone]  Fax [phone]

## 2024-03-05 NOTE — TOC Progression Note (Signed)
 Transition of Care [facility]) - Progression Note    Patient Details  Name: CACEY WILLOW MRN: [id number] Date of Birth: 1946-01-10  Transition of [facility] CM/SW Contact  Alfonso Rummer, LCSW Phone Number: 03/05/2024, 4:39 PM  Clinical Narrative:   KEN DELENA Rummer met with patient and spouse. Pt advised of bed offer for [facility] at [facility]. Pt accepted offer.     Expected Discharge Plan:  (TBD) Barriers to Discharge: Continued Medical Work up               Expected Discharge Plan and Services       Living arrangements for the past 2 months: Skilled Nursing Facility                                       Social Drivers of Health (SDOH) Interventions SDOH Screenings   Food Insecurity: No Food Insecurity (03/04/2024)  Housing: Low Risk  (03/04/2024)  Transportation Needs: No Transportation Needs (03/04/2024)  Utilities: Not At Risk (03/04/2024)  Depression (PHQ2-9): High Risk (01/09/2024)  Financial Resource Strain: Low Risk  (09/15/2021)  Physical Activity: Insufficiently Active (11/15/2020)  Social Connections: Unknown (03/04/2024)  Stress: Stress Concern Present (08/03/2020)  Tobacco Use: Low Risk  (03/03/2024)    Readmission Risk Interventions     No data to display

## 2024-03-05 NOTE — Progress Notes (Signed)
 PROGRESS NOTE    TERRILYN Sharp  FMW:[id number] DOB: [birth year] DOA: 03/03/2024 PCP: Denise Verneita CROME, MD  Chief Complaint  Patient presents with   Wound Infection    Hospital Course:  [name] is a 78 year old with hypertension, hyperlipidemia, CAD, anxiety, asthma, CKD, who presents from urology for chief concern of worsening wounds of the left foot, reports low grade fever at home, chills and increasing pain with discharge of her left foot. Progressive charcoaled arthropathy vs superimposed OM as per [facility] Hospital course as below   Subjective: Patient was examined at the bedside, was febrile 100.4F this morning, states she does not feel good. Also c/o Left knee pain, X ray ordered   Objective: Vitals:   03/05/24 0006 03/05/24 0619 03/05/24 0901 03/05/24 1100  BP: (!) 91/48 (!) 101/50 (!) 95/40 (!) 85/43  Pulse: 79 77 80   Resp: 16 16 20    Temp: 98.9 F (37.2 C) 99.4 F (37.4 C) (!) 100.9 F (38.3 C)   TempSrc:   Oral   SpO2: 99% 98% 95%   Weight:      Height:        Intake/Output Summary (Last 24 hours) at 03/05/2024 1536 Last data filed at 03/05/2024 1300 Gross per 24 hour  Intake 1080 ml  Output 900 ml  Net 180 ml   Filed Weights   03/03/24 1826  Weight: 110.3 kg    Examination: Constitutional: appears age-appropriate, NAD, calm Neck: normal, supple, no masses, no thyromegaly Respiratory: clear to auscultation bilaterally, no wheezing, no crackles. Normal respiratory effort. No accessory muscle use.  Cardiovascular: Regular rate and rhythm, no murmurs / rubs / gallops. No extremity edema. 2+ pedal pulses. No carotid bruits.  Abdomen: Morbidly obese abdomen, no tenderness, no masses palpated, no hepatosplenomegaly. Bowel sounds positive.  Musculoskeletal: no clubbing / cyanosis. No joint deformity upper and lower extremities. Good ROM, no contractures, no atrophy. Normal muscle tone.  Skin: + left lateral foot/ankle lesion with increased erythema  and edema Neurologic: Sensation intact. 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 Met SIRS criteria on adm (fever, leukocytosis, lactic acidosis) - Bcx 1/4: GPC (Staph species), suspect if bacteremia vs contaminant - XR Left foot advanced osseous destructive changes and fragmentation of the midfoot, consistent with Charcot arthropathy.  Suspect progressive erosive change and osseous destruction of calcaneus, talus, cuboid navicular and cuneiform bones.  Increased effusion and soft tissue swelling.  Progressive charcoaled arthropathy vs superimposed OM - MRI Left foot shows severe Charcot changes, findings concerning for early OM vs reactive changes throughout talus. No  evidence of abscess - Podiatry consult pending - On IV Vancomycin , add IV Cefepime   Left knee pain s/p Total left knee arthroplasty - XR No acute fracture or dislocation - Pain management  Hypotension Hx HTN Reports having BP discrepancy, higher in LE > UE chronically low SBP in 90's IV Albumin , avoid IV fluids due to hx CHF Monitor BP   Persistent atrial fibrillation  (HCC) Home apixaban  5 mg p.o. twice daily resumed   Iron  deficiency anemia Patient reports that she gets iron  infusion Outpatient follow-up as above.   OSA on CPAP CPAP nightly ordered  NICM with preserved ejection fraction - On torsemide  daily   Type II diabetes mellitus with neurological manifestations (HCC) Home long acting insulin , degludec 25 units subcutaneous daily resumed Insulin  SSI with HS coverage Goal inpatient blood glucose level 140-180   Chronic anxiety Home duloxetine  60 mg daily resumed   Hyperlipidemia Home atorvastatin  80 mg nightly resumed   Major depressive disorder with current active episode Home duloxetine  60 mg daily resumed  CKD stage 3a Monitor Cr, baseline appears 1.1-1.3   Morbid obesity (HCC) This complicates overall care and prognosis.    History of gout Outpatient 01/24/2024 Rheumatology note reviewed: Allopurinol  100 mg daily resumed, continue colchicine  as needed for inflammation Allopurinol  100 mg daily resume; discontinue scheduled colchicine    GAD (generalized anxiety disorder) PDMP reviewed  Obesity hypoventilation syndrome (HCC) Cpap at bedtime ordered   Weakness Fall precaution PT/OT rec SNF when ready  DVT prophylaxis: Eliquis    Code Status: Full Code Disposition:  TBD  Consultants:  Podiatry  Procedures:  None  Antimicrobials:  Anti-infectives (From admission, onward)    Start     Dose/Rate Route Frequency Ordered Stop   03/05/24 1700  vancomycin  (VANCOCIN ) IVPB 1000 mg/200 mL premix        1,000 mg 200 mL/hr over 60 Minutes Intravenous Every 24 hours 03/05/24 0813     03/05/24 1200  ceFEPIme  (MAXIPIME ) 2 g in sodium chloride  0.9 % 100 mL IVPB        2 g 200 mL/hr over 30 Minutes Intravenous 2 times daily 03/05/24 1128     03/04/24 1700  vancomycin  (VANCOREADY) IVPB 1000 mg/200 mL  Status:  Discontinued        1,000 mg 200 mL/hr over 60 Minutes Intravenous Every 24 hours 03/03/24 1738 03/05/24 0813    03/03/24 1730  vancomycin  (VANCOREADY) IVPB 2000 mg/400 mL        2,000 mg 200 mL/hr over 120 Minutes Intravenous  Once 03/03/24 1729 03/04/24 0534   03/03/24 1645  vancomycin  (VANCOCIN ) IVPB 1000 mg/200 mL premix  Status:  Discontinued        1,000 mg 200 mL/hr over 60 Minutes Intravenous  Once 03/03/24 1635 03/03/24 1729       Data Reviewed: I have personally reviewed following labs and imaging studies CBC: Recent Labs  Lab 03/03/24 1124 03/03/24 1452 03/04/24 0548 03/05/24 0432  WBC 12.2* 15.0* 15.1* 12.0*  NEUTROABS 8.6* 10.8*  --   --   HGB 11.9* 12.5 10.3* 10.0*  HCT 38.2 40.7 32.8* 32.2*  MCV 89.3 93.8 91.6 92.0  PLT 388.0 421* 366 346   Basic Metabolic Panel: Recent Labs  Lab 03/03/24 1111 03/03/24 1452 03/04/24 0548 03/05/24 0432  NA 140 138 141 137  K 3.9 3.8 4.1 4.0  CL 103 105 109 106  CO2 25 22 23 23   GLUCOSE 192* 271* 132* 94  BUN 10 11 16 21   CREATININE 1.01  1.08* 1.17* 1.22*  CALCIUM  8.4 8.6* 8.2* 7.9*   GFR: Estimated Creatinine Clearance: 46.1 mL/min (A) (by C-G formula based on SCr of 1.22 mg/dL (H)). Liver Function Tests: Recent Labs  Lab 03/03/24 1452  AST 18  ALT 14  ALKPHOS 165*  BILITOT 0.7  PROT 6.9  ALBUMIN  2.9*   CBG: Recent Labs  Lab 03/04/24 1141 03/04/24 1606 03/04/24 2058 03/05/24 0900 03/05/24 1237  GLUCAP 165* 107* 176* 109* 120*    Recent Results (from the past 240 hours)  Blood culture (routine x 2)     Status: Abnormal (Preliminary result)   Collection Time: 03/03/24  5:01 PM   Specimen: BLOOD RIGHT FOREARM  Result Value Ref Range Status   Specimen Description   Final    BLOOD RIGHT FOREARM Performed at [facility], [address] [place], [place] [address]    Special Requests   Final    BOTTLES DRAWN AEROBIC AND ANAEROBIC Blood Culture results may not be optimal due to an inadequate volume of blood received in culture bottles Performed at [facility], [address] [place],  [place] [address]    Culture  Setup Time   Final    GRAM POSITIVE COCCI ANAEROBIC BOTTLE ONLY CRITICAL RESULT CALLED TO, READ BACK BY AND VERIFIED WITH: TIFFANY GILCHRIST, PHARMD AT 1010 ON 03/04/24 GM    Culture (A)  Final    STAPHYLOCOCCUS HOMINIS THE SIGNIFICANCE OF ISOLATING THIS ORGANISM FROM A SINGLE SET OF BLOOD CULTURES WHEN MULTIPLE SETS ARE DRAWN IS UNCERTAIN. PLEASE NOTIFY THE MICROBIOLOGY DEPARTMENT WITHIN ONE WEEK IF SPECIATION AND SENSITIVITIES ARE REQUIRED. Performed at [facility], [address] [place], [place] [address]    Report Status PENDING  Incomplete  Blood Culture ID Panel (Reflexed)     Status: Abnormal   Collection Time: 03/03/24  5:01 PM  Result Value Ref Range Status   Enterococcus faecalis NOT DETECTED NOT DETECTED Final   Enterococcus Faecium NOT DETECTED NOT DETECTED Final   Listeria monocytogenes NOT DETECTED NOT DETECTED Final   Staphylococcus species DETECTED (A) NOT DETECTED Final    Comment: CRITICAL RESULT CALLED TO, READ BACK BY AND VERIFIED WITH: TIFFANY GILCHRIST, PHARMD AT 1010 ON 03/04/24 GM    Staphylococcus aureus (BCID) NOT DETECTED NOT DETECTED Final   Staphylococcus epidermidis NOT DETECTED NOT DETECTED Final   Staphylococcus lugdunensis NOT DETECTED NOT DETECTED Final   Streptococcus species NOT DETECTED NOT DETECTED Final   Streptococcus agalactiae NOT DETECTED NOT DETECTED Final   Streptococcus pneumoniae NOT DETECTED NOT DETECTED Final   Streptococcus pyogenes NOT DETECTED NOT DETECTED Final   A.calcoaceticus-baumannii NOT DETECTED NOT DETECTED Final   Bacteroides fragilis NOT DETECTED NOT DETECTED Final   Enterobacterales NOT DETECTED NOT DETECTED Final   Enterobacter cloacae complex NOT DETECTED NOT DETECTED Final   Escherichia coli NOT DETECTED NOT DETECTED Final   Klebsiella aerogenes NOT DETECTED NOT DETECTED Final   Klebsiella oxytoca NOT DETECTED NOT DETECTED Final   Klebsiella pneumoniae NOT DETECTED NOT DETECTED Final    Proteus species NOT DETECTED NOT DETECTED Final   Salmonella species NOT DETECTED NOT DETECTED Final   Serratia marcescens NOT DETECTED NOT DETECTED Final   Haemophilus influenzae NOT DETECTED NOT DETECTED Final   Neisseria meningitidis NOT DETECTED NOT DETECTED Final   Pseudomonas aeruginosa NOT DETECTED NOT DETECTED Final   Stenotrophomonas maltophilia NOT DETECTED NOT DETECTED Final   Candida albicans NOT DETECTED NOT DETECTED Final   Candida auris NOT DETECTED NOT  DETECTED Final   Candida glabrata NOT DETECTED NOT DETECTED Final   Candida krusei NOT DETECTED NOT DETECTED Final   Candida parapsilosis NOT DETECTED NOT DETECTED Final   Candida tropicalis NOT DETECTED NOT DETECTED Final   Cryptococcus neoformans/gattii NOT DETECTED NOT DETECTED Final    Comment: Performed at [facility], [address] [place], [place] [address]  Blood culture (routine x 2)     Status: None (Preliminary result)   Collection Time: 03/03/24  7:01 PM   Specimen: BLOOD  Result Value Ref Range Status   Specimen Description BLOOD BLOOD RIGHT HAND  Final   Special Requests   Final    BOTTLES DRAWN AEROBIC AND ANAEROBIC Blood Culture results may not be optimal due to an inadequate volume of blood received in culture bottles   Culture   Final    NO GROWTH 2 DAYS Performed at [facility], [address] [place], [place] [address]    Report Status PENDING  Incomplete     Radiology Studies: DG Knee 1-2 Views Left Result Date: 03/05/2024 CLINICAL DATA:  Left knee pain. EXAM: LEFT KNEE - 1-2 VIEW COMPARISON:  None Available. FINDINGS: There is a total left knee arthroplasty. The arthroplasty components appear intact and in anatomic alignment. There is no acute fracture or dislocation. The bones are osteopenic. A small suprapatellar effusion may be present. The soft tissues are unremarkable. IMPRESSION: 1. No acute fracture or dislocation. 2. Total left knee arthroplasty. Electronically Signed    By: Vanetta Chou M.D.   On: 03/05/2024 14:21   MR FOOT LEFT WO CONTRAST Result Date: 03/05/2024 CLINICAL DATA:  Osteomyelitis, foot Worsening left foot wounds.  History of diabetes and polyneuropathy. EXAM: MRI OF THE LEFT FOOT WITHOUT CONTRAST TECHNIQUE: Multiplanar, multisequence MR imaging of the left hindfoot was performed. No intravenous contrast was administered. COMPARISON:  Radiographs 03/03/2024, CT 01/24/2024 and MRI 05/07/2008. FINDINGS: Bones/Joint/Cartilage As stated in the technique section, this examination is targeted to the hindfoot and ankle. The midfoot is included. The forefoot is incompletely visualized. As shown on prior imaging, there are severe Charcot changes within the hindfoot and midfoot. There is chronic fragmentation and partial destruction of the metatarsal bases, cuneiform bones, cuboid, navicular, talus and calcaneus. Compared with the previous CT, there is new, near complete medial dislocation at the subtalar joint. There is chronic dorsal and medial dislocation at the transverse tarsal joint. There is nonspecific, patchy T2 hyperintensity throughout the talus without gross bone destruction. There are complex ankle and subtalar joint effusions. Ligaments No acute ligamentous findings are demonstrated. The deltoid ligament appears thickened. Muscles and Tendons There is a longitudinal split tear the peroneus brevis tendon associated with mild peroneal tendon sheath fluid. The medial flexor tendons are intact, although the posterior tibialis tendon appears stretched due to the subtalar dislocation. The anterior extensor and Achilles tendons are intact. The plantar fascia is intact. Mild generalized muscular edema without apparent focal abnormality. Soft tissues Generalized subcutaneous edema. Possible skin ulceration laterally in the hindfoot without evidence of underlying organized fluid collection. IMPRESSION: 1. Severe Charcot changes within the hindfoot and midfoot with  chronic dorsal and medial dislocation at the transverse tarsal joint and new, near complete medial dislocation at the subtalar joint. 2. Nonspecific patchy T2 hyperintensity throughout the talus without gross bone destruction. This could be reactive or secondary to early osteomyelitis. 3. Complex ankle and subtalar joint effusions. 4. Longitudinal split tear of the peroneus brevis tendon. 5. Generalized subcutaneous edema with possible skin  ulceration laterally in the hindfoot. Correlate clinically. No evidence of soft tissue abscess. Electronically Signed   By: Elsie Perone M.D.   On: 03/05/2024 08:24   DG Foot Complete Left Result Date: 03/03/2024 CLINICAL DATA:  Cellulitis EXAM: LEFT FOOT - COMPLETE 3+ VIEW COMPARISON:  CT 01/24/2024, radiograph 11/27/2023 FINDINGS: Vascular calcifications and generalized soft tissue swelling. Prior partial resection of the fifth metatarsal. Chronic irregular arthropathy at the second third and fourth MTP joints. Advanced osseous destructive change and fragmentation of the midfoot, consistent with Charcot arthropathy, suspect that there is progressive erosive change and osseous destruction of the calcaneus, talus, cuboid navicular and cuneiform bones compared with the radiograph from May as there appears to be progressive subtalar collapse. There is increased effusion and soft tissue swelling compared with exam from May. Moderate plantar calcaneal spur. IMPRESSION: 1. Advanced osseous destructive change and fragmentation of the midfoot, consistent with Charcot arthropathy, suspect that there is progressive erosive change and osseous destruction of the calcaneus, talus, cuboid navicular and cuneiform bones compared with the radiograph from May. There is increased effusion and soft tissue swelling compared with exam from May. Findings are concerning for progressive Charcot arthropathy versus superimposed osteomyelitis. 2. Prior partial resection of the fifth metatarsal.  Chronic irregular arthropathy at the second third and fourth MTP joints. Chronic degenerative change at the first IP and MTP joints. Electronically Signed   By: Luke Bun M.D.   On: 03/03/2024 19:37    Scheduled Meds:  allopurinol   100 mg Oral Daily   apixaban   5 mg Oral BID   atorvastatin   80 mg Oral QHS   dapagliflozin  propanediol  10 mg Oral Daily   DULoxetine   60 mg Oral Daily   ezetimibe   10 mg Oral Daily   Finerenone   10 mg Oral q AM   fluticasone  furoate-vilanterol  1 puff Inhalation Daily   insulin  aspart  0-15 Units Subcutaneous TID WC   insulin  aspart  0-5 Units Subcutaneous QHS   insulin  glargine  25 Units Subcutaneous Q2200   pantoprazole   40 mg Oral Daily   pregabalin   50 mg Oral BID   tiZANidine   4 mg Oral BID   torsemide   20 mg Oral Daily   Continuous Infusions:  ceFEPime  (MAXIPIME ) IV 2 g (03/05/24 1505)   vancomycin        LOS: 2 days  MDM: Patient is high risk for one or more organ failure.  They necessitate ongoing hospitalization for continued IV therapies and subsequent lab monitoring. Total time spent interpreting labs and vitals, reviewing the medical record, coordinating care amongst consultants and care team members, directly assessing and discussing care with the patient and/or family: 55 min  Laree Lock, MD [facility]  To contact the attending physician between 7A-7P please use Epic Chat. To contact the covering physician during after hours 7P-7A, please review Amion.  03/05/2024, 3:36 PM   *This document has been created with the assistance of dictation software. Please excuse typographical errors. *

## 2024-03-05 NOTE — Progress Notes (Signed)
 Occupational Therapy Treatment Patient Details Name: Denise Sharp: [id number] DOB: 10-12-1945 Today's Date: 03/05/2024   History of present illness Pt is a 78 year old with hypertension, hyperlipidemia, CAD, anxiety, asthma, CKD, who presents from urology for chief concern of worsening wounds of the left foot.  Dx: Cellulitis of left foot, SIRS (systemic inflammatory response syndrome), Persistent atrial fibrillation   OT comments  Pt seen for OT treatment on this date. Upon arrival to room pt supine in bed, agreeable to tx. Pt requires Min A for bed mobility with increased time, completed ADL engagement at EOB with increased use of LUE throughout. Donned grip sock to RLE with Mod A at EOB. Pt bed soiled, educated pt on scooting to [facility] to allow for bed linen change and for changing undergarment at EOB with modified technique however pt very encouraged to stand for task engagement.  Education use of BUEs to RLE for greatest WB during static standing at RW level and for moving toward EOB at RW level with CGA and max cuing for technique with Max A for brief management.  Pt making good progress toward goals, will continue to follow POC. Discharge recommendation remains appropriate.        If plan is discharge home, recommend the following:  A lot of help with bathing/dressing/bathroom;A lot of help with walking and/or transfers;Help with stairs or ramp for entrance;Assistance with cooking/housework   Equipment Recommendations       Recommendations for Other Services      Precautions / Restrictions Precautions Precautions: Fall Recall of Precautions/Restrictions: Intact       Mobility Bed Mobility Overal bed mobility: Needs Assistance Bed Mobility: Supine to Sit, Sit to Supine     Supine to sit: Min assist, HOB elevated, Used rails Sit to supine: Min assist, HOB elevated, Used rails   General bed mobility comments: Verbal cuing for hand placement and sequencing.     Transfers Overall transfer level: Needs assistance Equipment used: Rolling walker (2 wheels) Transfers: Sit to/from Stand Sit to Stand: Min assist                 Balance Overall balance assessment: Needs assistance Sitting-balance support: Feet supported, Single extremity supported Sitting balance-Leahy Scale: Fair Sitting balance - Comments: pt engaged in ADLs at EOB   Standing balance support: During functional activity, Reliant on assistive device for balance, Bilateral upper extremity supported Standing balance-Leahy Scale: Poor                             ADL either performed or assessed with clinical judgement   ADL Overall ADL's : Needs assistance/impaired Eating/Feeding: Set up;Sitting                     Lower Body Dressing Details (indicate cue type and reason): Donned gripper sock to RLE with Mod A while seated     Toileting- Clothing Manipulation and Hygiene: Maximal assistance;Sit to/from stand         General ADL Comments: Pt more willing to engage in ADLs OOB on this session    Extremity/Trunk Assessment Upper Extremity Assessment Upper Extremity Assessment: Generalized weakness            Vision       Perception     Praxis     Communication Communication Communication: No apparent difficulties   Cognition Arousal: Alert Behavior During Therapy: WFL for tasks assessed/performed Cognition: No apparent  impairments                               Following commands: Intact        Cueing   Cueing Techniques: Verbal cues  Exercises      Shoulder Instructions       General Comments      Pertinent Vitals/ Pain       Pain Assessment Pain Assessment: Faces Faces Pain Scale: Hurts a little bit Pain Location: L hand Pain Descriptors / Indicators: Guarding Pain Intervention(s): Monitored during session  Home Living                                          Prior  Functioning/Environment              Frequency  Min 2X/week        Progress Toward Goals  OT Goals(current goals can now be found in the care plan section)  Progress towards OT goals: Progressing toward goals     Plan      Co-evaluation                 AM-PAC OT 6 Clicks Daily Activity     Outcome Measure   Help from another person eating meals?: None Help from another person taking care of personal grooming?: A Little Help from another person toileting, which includes using toliet, bedpan, or urinal?: A Lot Help from another person bathing (including washing, rinsing, drying)?: A Lot Help from another person to put on and taking off regular upper body clothing?: A Little Help from another person to put on and taking off regular lower body clothing?: A Lot 6 Click Score: 16    End of Session Equipment Utilized During Treatment: Gait belt;Rolling walker (2 wheels)  OT Visit Diagnosis: Unsteadiness on feet (R26.81);Muscle weakness (generalized) (M62.81)   Activity Tolerance Patient tolerated treatment well   Patient Left in bed;with call bell/phone within reach;with bed alarm set;with nursing/sitter in room (pt handoff to RN and x-ray tech)   Nurse Communication Mobility status        Time: 8788-8749 OT Time Calculation (min): 39 min  Charges: OT General Charges $OT Visit: 1 Visit OT Treatments $Self Care/Home Management : 38-52 mins  Harlene Sharps OTR/L   Harlene LITTIE Sharps 03/05/2024, 1:47 PM

## 2024-03-05 NOTE — TOC Progression Note (Signed)
 Transition of Care [facility]) - Progression Note    Patient Details  Name: CERINA LEARY MRN: [id number] Date of Birth: 05-19-1946  [facility] CM/SW Contact  Dalia GORMAN Fuse, RN Phone Number: 03/05/2024, 4:24 PM  Clinical Narrative:    Patient accepted bed offer at [facility]. TOC spoke with Sonny at [facility] to notify her the patient accepts.    Expected Discharge Plan:  (TBD) Barriers to Discharge: Continued Medical Work up               Expected Discharge Plan and Services       Living arrangements for the past 2 months: Skilled Nursing Facility                                       Social Drivers of Health (SDOH) Interventions SDOH Screenings   Food Insecurity: No Food Insecurity (03/04/2024)  Housing: Low Risk  (03/04/2024)  Transportation Needs: No Transportation Needs (03/04/2024)  Utilities: Not At Risk (03/04/2024)  Depression (PHQ2-9): High Risk (01/09/2024)  Financial Resource Strain: Low Risk  (09/15/2021)  Physical Activity: Insufficiently Active (11/15/2020)  Social Connections: Unknown (03/04/2024)  Stress: Stress Concern Present (08/03/2020)  Tobacco Use: Low Risk  (03/03/2024)    Readmission Risk Interventions     No data to display

## 2024-03-06 ENCOUNTER — Telehealth: Payer: Self-pay

## 2024-03-06 DIAGNOSIS — Z794 Long term (current) use of insulin: Secondary | ICD-10-CM

## 2024-03-06 DIAGNOSIS — E11628 Type 2 diabetes mellitus with other skin complications: Secondary | ICD-10-CM | POA: Diagnosis not present

## 2024-03-06 DIAGNOSIS — S91302A Unspecified open wound, left foot, initial encounter: Secondary | ICD-10-CM | POA: Diagnosis not present

## 2024-03-06 DIAGNOSIS — M869 Osteomyelitis, unspecified: Secondary | ICD-10-CM | POA: Diagnosis not present

## 2024-03-06 DIAGNOSIS — M14672 Charcot's joint, left ankle and foot: Secondary | ICD-10-CM

## 2024-03-06 DIAGNOSIS — Z79899 Other long term (current) drug therapy: Secondary | ICD-10-CM

## 2024-03-06 DIAGNOSIS — Z7901 Long term (current) use of anticoagulants: Secondary | ICD-10-CM

## 2024-03-06 DIAGNOSIS — L03116 Cellulitis of left lower limb: Secondary | ICD-10-CM | POA: Diagnosis not present

## 2024-03-06 LAB — CULTURE, BLOOD (ROUTINE X 2)

## 2024-03-06 LAB — CBC
HCT: 30.4 % — ABNORMAL LOW (ref 36.0–46.0)
Hemoglobin: 9.5 g/dL — ABNORMAL LOW (ref 12.0–15.0)
MCH: 28.4 pg (ref 26.0–34.0)
MCHC: 31.3 g/dL (ref 30.0–36.0)
MCV: 91 fL (ref 80.0–100.0)
Platelets: 339 K/uL (ref 150–400)
RBC: 3.34 MIL/uL — ABNORMAL LOW (ref 3.87–5.11)
RDW: 16 % — ABNORMAL HIGH (ref 11.5–15.5)
WBC: 12.7 K/uL — ABNORMAL HIGH (ref 4.0–10.5)
nRBC: 0 % (ref 0.0–0.2)

## 2024-03-06 LAB — BASIC METABOLIC PANEL WITH GFR
Anion gap: 9 (ref 5–15)
BUN: 21 mg/dL (ref 8–23)
CO2: 20 mmol/L — ABNORMAL LOW (ref 22–32)
Calcium: 8.4 mg/dL — ABNORMAL LOW (ref 8.9–10.3)
Chloride: 105 mmol/L (ref 98–111)
Creatinine, Ser: 1.21 mg/dL — ABNORMAL HIGH (ref 0.44–1.00)
GFR, Estimated: 46 mL/min — ABNORMAL LOW (ref >60)
Glucose, Bld: 154 mg/dL — ABNORMAL HIGH (ref 70–99)
Potassium: 4 mmol/L (ref 3.5–5.1)
Sodium: 134 mmol/L — ABNORMAL LOW (ref 135–145)

## 2024-03-06 LAB — GLUCOSE, CAPILLARY
Glucose-Capillary: 122 mg/dL — ABNORMAL HIGH (ref 70–99)
Glucose-Capillary: 138 mg/dL — ABNORMAL HIGH (ref 70–99)
Glucose-Capillary: 103 mg/dL — ABNORMAL HIGH (ref 70–99)
Glucose-Capillary: 166 mg/dL — ABNORMAL HIGH (ref 70–99)

## 2024-03-06 LAB — TYPE AND SCREEN
ABO/RH(D): A POS
Antibody Screen: NEGATIVE

## 2024-03-06 MED ORDER — INSULIN GLARGINE 100 UNIT/ML ~~LOC~~ SOLN
12.0000 [IU] | Freq: Every day | SUBCUTANEOUS | Status: DC
  Administered 2024-03-06: 12 [IU] via SUBCUTANEOUS
  Filled 2024-03-06 (×2): qty 0.12

## 2024-03-06 MED ORDER — DIPHENHYDRAMINE HCL 50 MG/ML IJ SOLN: 50.0000 mg | Freq: Once | INTRAMUSCULAR | Status: DC | PRN

## 2024-03-06 MED ORDER — METHYLPREDNISOLONE SODIUM SUCC 125 MG IJ SOLR: 125.0000 mg | Freq: Once | INTRAMUSCULAR | Status: DC | PRN

## 2024-03-06 MED ORDER — CHLORHEXIDINE GLUCONATE 4 % EX SOLN
60.0000 mL | Freq: Once | CUTANEOUS | Status: AC
  Administered 2024-03-06: 4 via TOPICAL

## 2024-03-06 MED ORDER — CHLORHEXIDINE GLUCONATE 4 % EX SOLN
60.0000 mL | Freq: Once | CUTANEOUS | Status: AC
  Administered 2024-03-07: 4 via TOPICAL

## 2024-03-06 MED ORDER — FAMOTIDINE 20 MG PO TABS: 40.0000 mg | ORAL_TABLET | Freq: Once | ORAL | Status: DC | PRN

## 2024-03-06 NOTE — Progress Notes (Addendum)
 Physical Therapy Treatment Patient Details Name: Denise Sharp MRN: [id number] DOB: 11/28/1945 Today's Date: 03/06/2024   History of Present Illness Pt is a 78 year old with hypertension, hyperlipidemia, CAD, anxiety, asthma, CKD, who presents from urology for chief concern of worsening wounds of the left foot.  Dx: Cellulitis of left foot, SIRS (systemic inflammatory response syndrome), Persistent atrial fibrillation    PT Comments  Per chart, patient scheduled for L BKA next date.  Patient declined OOB/mobility efforts (outside of bed-level activities) until procedure complete. Session focus on bilat LE ROM, as patient endorsing increased L knee stiffness/soreness.  Tolerates ROM throughout grossly functional range, but does require increased assist for L > R LE.  Mild edema to L knee appreciated; no visible warmth or redness.   Will continue to follow; anticipate need for new orders post-procedure for reassessment and establishment of new POC   If plan is discharge home, recommend the following: A lot of help with walking and/or transfers;A little help with bathing/dressing/bathroom;Assistance with cooking/housework;Assist for transportation;Help with stairs or ramp for entrance   Can travel by private vehicle     No  Equipment Recommendations       Recommendations for Other Services       Precautions / Restrictions Precautions Precautions: Fall     Mobility  Bed Mobility               General bed mobility comments: patient declined OOB activities    Transfers                   General transfer comment: patient declined OOB activities    Ambulation/Gait               General Gait Details: patient declined OOB activities   Stairs             Wheelchair Mobility     Tilt Bed    Modified Rankin (Stroke Patients Only)       Balance                                            Communication    Cognition  Arousal: Alert Behavior During Therapy: WFL for tasks assessed/performed   PT - Cognitive impairments: No apparent impairments                                Cueing    Exercises Other Exercises Other Exercises: Bilat LE supine therex, 1x12, act/act assist ROM: quad sets, SAQs, heel slides, hip abduct/adduct, SLR.  Endorses generalized stiffness, soreness to L > R knee, requiring increased assist to complete movement throughout available ROM    General Comments        Pertinent Vitals/Pain Pain Assessment Pain Assessment: Faces Pain Location: L knee Pain Descriptors / Indicators: Grimacing, Moaning Pain Intervention(s): Limited activity within patient's tolerance, Monitored during session, Repositioned, Premedicated before session    Home Living                          Prior Function            PT Goals (current goals can now be found in the care plan section) Acute Rehab PT Goals Patient Stated Goal: To get better PT Goal Formulation: With patient Time For  Goal Achievement: 03/18/24 Potential to Achieve Goals: Fair Progress towards PT goals: Progressing toward goals    Frequency    Min 2X/week      PT Plan      Co-evaluation              AM-PAC PT 6 Clicks Mobility   Outcome Measure  Help needed turning from your back to your side while in a flat bed without using bedrails?: A Little Help needed moving from lying on your back to sitting on the side of a flat bed without using bedrails?: A Little Help needed moving to and from a bed to a chair (including a wheelchair)?: Total Help needed standing up from a chair using your arms (e.g., wheelchair or bedside chair)?: Total Help needed to walk in hospital room?: Total Help needed climbing 3-5 steps with a railing? : Total 6 Click Score: 10    End of Session   Activity Tolerance: Patient limited by pain Patient left: in bed;with call bell/phone within reach;with bed alarm  set;with family/visitor present   PT Visit Diagnosis: Unsteadiness on feet (R26.81);Muscle weakness (generalized) (M62.81);Difficulty in walking, not elsewhere classified (R26.2)     Time: 8880-8867 PT Time Calculation (min) (ACUTE ONLY): 13 min  Charges:    $Therapeutic Exercise: 8-22 mins PT General Charges $$ ACUTE PT VISIT: 1 Visit                     Christobal Morado H. Delores, PT, DPT, NCS 03/06/24, 11:43 AM [phone]

## 2024-03-06 NOTE — Care Management Important Message (Signed)
 Important Message  Patient Details  Name: GLADIS SOLEY MRN: [id number] Date of Birth: 06-05-1946   Important Message Given:  Yes - Medicare IM     Mialynn Shelvin W, CMA 03/06/2024, 2:33 PM

## 2024-03-06 NOTE — Telephone Encounter (Signed)
 Attempted to call pt but phone disconnected to inform pt that we need her to sign ROI form

## 2024-03-06 NOTE — Consult Note (Signed)
 Hospital Consult    Reason for Consult:  Left Foot Infection with Severe Charcot Neuroarthropathy  Requesting Physician:  Dr Thresa Sar MD  MRN #:  [id number]  History of Present Illness: This is a 78 y.o. female PMHx HTN, CAD, CKD, diabetes mellitus, last A1c was 7.6 on 01/07/2024, admitted for persistent fever with worsening wound to the left foot. History of Charcot neuroarthropathy although she reports that she was diagnosed by multiple physicians previously for gout. Patient states she finished antibiotics at her rehab facility has been home for the past week, states she saw her doctor prior who sent them to the emergency department given the worsening wound and now redness swelling of the left hand.   Patient was seen by podiatry in consult.  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  On MRI she is noted to have severe Charcot changes within the hindfoot and midfoot with chronic dorsal and medial dislocation of the transverse tarsal joint with new, near complete medial dislocation at the subtalar joints.  Also noted to have a nonspecific T2 hyperintensity throughout the talus.  It also revealed a generalized subcutaneous edema with a skin ulceration laterally in the hindfoot from the boot she was wearing.  Vascular surgery consulted to evaluate for amputation.  Past Medical History:  Diagnosis Date   (HFpEF) heart failure with preserved ejection fraction (HCC)    a. 12/2019 Echo: EF 65-70%, Gr2 DD. No significant valvular dzs.   Abnormality of gait 03/25/2013   Adrenal mass, left (HCC)    Anginal pain (HCC)    Anxiety    Aortic atherosclerosis (HCC)    Arthritis    Asthma    CAD (coronary artery disease) with h/o Atypical Chest Pain    a. 04/1986 Cath ([facility]): nl cors, EF 65%; b. 07/2012 Cath [facility]): Diff minor irregs; c. 07/2016 MV [facility]): Equivocal; d. 08/2016 Cardiac CT Ca2+ score [facility]): Ca2+ 1548; e. 05/2019 MV: No ischemia.  EF 75%; f. 12/2019 PCI: LM nl, LAD 65ost (3.5x12 Resolute DES), 46m (2.75x12 Resolute DES),LCX/RCA nl; g. 08/2020 Cath: patent LAD stents, RCA 40ost, elev RH pressures, EF >65%.   Cervical spinal stenosis 1994   due to trauma to back ([employer] accident), has intermittent paralysis and parasthesias   Cervicogenic headache 03/23/2014   CKD (chronic kidney disease), stage III (HCC)    Depression    Diverticulosis    Dizziness    a.) chronic   DJD (degenerative joint disease)    a. Chronic R shoulder pain   Dyspnea    Esophageal stenosis 09/2009   a.) transient outlet obstruction by food, cleared by EGD   Family history of adverse reaction to anesthesia    a.) daughter with (+) PONV   Gastric bypass status for obesity    GERD (gastroesophageal reflux disease)    Gout    Headache(784.0)    HLD (hyperlipidemia)    Hypertension    IBS (irritable bowel syndrome)    IDA (iron  deficiency anemia)    a.) post 2 unit txfsn 2009, normal endo/colonoscopy by [facility]   Left bundle branch block (LBBB)    a.) Intermittently present - likely rate related.   NSVT (nonsustained ventricular tachycardia) (HCC) 11/15/2020   a.) Holter study 11/15/2020; 11 episodes of NSVT lasting up to 8 beats with a maximum rate of 210 bpm; 14 atrial runs lasting up to 18 beats with a rate of up to 250 bpm.  Some atrial runs felt  to be NSVT.   NSVT (nonsustained ventricular tachycardia) (HCC)    a. 10/2020 [facility]: 11 runs of NSVT up to 8 beats.   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 10/2020 [facility]: 14 atrial runs up to 18 beats, max HR 250.   Restless leg syndrome    Rotator cuff arthropathy, right 08/13/2017   Syncope and collapse 03/12/2014   Type II diabetes mellitus (HCC)     Past Surgical History:  Procedure Laterality Date   APPENDECTOMY     BACK SURGERY      CARDIOVASCULAR STRESS TEST     CARDIOVERSION N/A 10/30/2022   Procedure: CARDIOVERSION;  Surgeon: Mady Bruckner, MD;  Location: [facility];  Service: Cardiovascular;  Laterality: N/A;   CHOLECYSTECTOMY N/A 1976   COLONOSCOPY     CORONARY ANGIOPLASTY     CORONARY STENT INTERVENTION N/A 01/04/2020   Procedure: CORONARY STENT INTERVENTION;  Surgeon: Darron Deatrice LABOR, MD;  Location: [facility];  Service: Cardiovascular;  Laterality: N/A;  LAD    DIAPHRAGMATIC HERNIA REPAIR  2015   ESOPHAGEAL DILATION     multiple   ESOPHAGOGASTRODUODENOSCOPY (EGD) WITH PROPOFOL  N/A 05/11/2021   Procedure: ESOPHAGOGASTRODUODENOSCOPY (EGD) WITH PROPOFOL ;  Surgeon: Jinny Carmine, MD;  Location: [facility];  Service: Endoscopy;  Laterality: N/A;   ESOPHAGOGASTRODUODENOSCOPY (EGD) WITH PROPOFOL  N/A 06/13/2021   Procedure: ESOPHAGOGASTRODUODENOSCOPY (EGD) WITH PROPOFOL ;  Surgeon: Jinny Carmine, MD;  Location: [facility];  Service: Endoscopy;  Laterality: N/A;   ESOPHAGOGASTRODUODENOSCOPY (EGD) WITH PROPOFOL  N/A 02/28/2023   Procedure: ESOPHAGOGASTRODUODENOSCOPY (EGD) WITH PROPOFOL ;  Surgeon: Jinny Carmine, MD;  Location: [facility];  Service: Endoscopy;  Laterality: N/A;   EYE SURGERY     GASTRIC BYPASS  2000, 2005   Dr. Mia REDBIRD IMPLANT PLACEMENT  10/2011   Ike REDBIRD IMPLANT PLACEMENT N/A 09/18/2021   Procedure: REDBIRD IMPLANT FIRST STAGE;  Surgeon: Gaston Hamilton, MD;  Location: [facility];  Service: Urology;  Laterality: N/A;   INTERSTIM IMPLANT PLACEMENT N/A 09/18/2021   Procedure: REDBIRD IMPLANT SECOND STAGE WITH IMPEDENCE CHECK;  Surgeon: Gaston Hamilton, MD;  Location: [facility];  Service: Urology;  Laterality: N/A;   INTERSTIM IMPLANT REMOVAL N/A 09/18/2021   Procedure: REMOVAL OF INTERSTIM IMPLANT;  Surgeon: Gaston Hamilton, MD;  Location: [facility];  Service: Urology;  Laterality: N/A;   LEFT HEART CATH AND CORONARY ANGIOGRAPHY N/A 04/1986   LEFT HEART  CATH AND CORONARY ANGIOGRAPHY Left 07/24/2012   LEFT HEART CATH AND CORS/GRAFTS ANGIOGRAPHY N/A 12/31/2019   Procedure: LEFT HEART CATH AND CORS/GRAFTS ANGIOGRAPHY;  Surgeon: Perla Evalene PARAS, MD;  Location: [facility];  Service: Cardiovascular;  Laterality: N/A;   PANNICULECTOMY N/A 06/16/2019   Procedure: PANNICULECTOMY;  Surgeon: Elisabeth Craig RAMAN, MD;  Location: [facility];  Service: Plastics;  Laterality: N/A;  3 hours, please   REVERSE SHOULDER ARTHROPLASTY Right 08/13/2017   Procedure: REVERSE RIGHT SHOULDER ARTHROPLASTY;  Surgeon: Josefina Chew, MD;  Location: [facility];  Service: Orthopedics;  Laterality: Right;   RIGHT/LEFT HEART CATH AND CORONARY ANGIOGRAPHY N/A 09/06/2020   Procedure: RIGHT/LEFT HEART CATH AND CORONARY ANGIOGRAPHY;  Surgeon: Mady Bruckner, MD;  Location: [facility];  Service: Cardiovascular;  Laterality: N/A;   RIGHT/LEFT HEART CATH AND CORONARY ANGIOGRAPHY N/A 06/04/2022   Procedure: RIGHT/LEFT HEART CATH AND CORONARY ANGIOGRAPHY;  Surgeon: End,  Lonni, MD;  Location: [facility];  Service: Cardiovascular;  Laterality: N/A;   ROTATOR CUFF REPAIR Right    SPINE SURGERY  1995   [place]   TOOTH EXTRACTION  11/13/2021   TOTAL ABDOMINAL HYSTERECTOMY W/ BILATERAL SALPINGOOPHORECTOMY  1974   TOTAL KNEE ARTHROPLASTY Bilateral 2007   Procedure: TOTAL KNEE ARTHROPLASTY; Surgeons: Kathi, MD and [name], MD   UMBILICAL HERNIA REPAIR  08/11/2015    Allergies  Allergen Reactions   Amoxicillin  Diarrhea    amoxicillin    Clarithromycin  Nausea And Vomiting and Other (See Comments)    Severe irritable bowel  clarithromycin    Demeclocycline Hives   Erythromycin Nausea And Vomiting and Other (See Comments)    Severe irritable bowel   Flagyl [Metronidazole] Nausea And Vomiting and Other (See Comments)    Severe irritable bowel   Glucophage [Metformin Hcl] Nausea And Vomiting and Other (See Comments)    Sick I won't take anything that has  metformin in it   Tetracyclines & Related Hives and Rash   Potassium Chloride  Other (See Comments)    Cannot swallow potssium pills, able to tolerate liquid potassium   Tetracycline Other (See Comments)   Diovan [Valsartan] Nausea Only        Sulfa  Antibiotics Other (See Comments) and Rash    As child   Xanax [Alprazolam] Other (See Comments)    Hyperactivity     Prior to Admission medications   Medication Sig Start Date End Date Taking? Authorizing Provider  apixaban  (ELIQUIS ) 5 MG TABS tablet Take 1 tablet (5 mg total) by mouth 2 (two) times daily. 06/19/23  Yes End, Lonni, MD  atorvastatin  (LIPITOR ) 80 MG tablet Take 1 tablet (80 mg total) by mouth daily. 01/09/24  Yes Marylynn Verneita CROME, MD  budesonide -formoterol  (SYMBICORT ) 160-4.5 MCG/ACT inhaler Inhale 2 puffs into the lungs 2 (two) times daily. 03/07/23  Yes Sood, Vineet, MD  cefdinir  (OMNICEF ) 300 MG capsule Take 300 mg by mouth 2 (two) times daily.   Yes [provider]  Cholecalciferol  (VITAMIN D3) 250 MCG (10000 UT) capsule Take 10,000 Units by mouth daily.   Yes [provider]  clotrimazole -betamethasone  (LOTRISONE ) cream Apply 1 application topically 2 (two) times daily as needed (irritation).   Yes [provider]  colchicine  0.6 MG tablet Take 1 tablet (0.6 mg total) by mouth 2 (two) times daily. 12/19/23  Yes Bair, Kalpana, MD  conjugated estrogens  (PREMARIN ) vaginal cream Apply one pea-sized amount around the opening of the urethra daily for 2 weeks, then 3 times weekly moving forward. 04/20/22  Yes Vaillancourt, Samantha, PA-C  cyanocobalamin  (VITAMIN B12) 1000 MCG/ML injection Inject 1 mL (1,000 mcg total) into the muscle every 30 (thirty) days. Inject 1 ml (1000 mcg ) IM weekly x 4,  Then monthly thereafter 10/09/23  Yes Marylynn Verneita CROME, MD  dapagliflozin  propanediol (FARXIGA ) 10 MG TABS tablet Take 1 tablet (10 mg total) by mouth daily. 01/07/24  Yes Therisa Benton PARAS, NP  dicyclomine  (BENTYL )  20 MG tablet Take 1 tablet (20 mg total) by mouth 3 (three) times daily before meals. 01/09/24  Yes Marylynn Verneita CROME, MD  DULoxetine  (CYMBALTA ) 60 MG capsule Take 1 capsule (60 mg total) by mouth daily. 05/01/23  Yes Gayland Lauraine PARAS, NP  ezetimibe  (ZETIA ) 10 MG tablet Take 1 tablet (10 mg total) by mouth daily. 01/09/24 04/08/24 Yes Marylynn Verneita CROME, MD  fexofenadine (ALLEGRA) 180 MG tablet Take 180 mg by mouth as needed for allergies or rhinitis.   Yes  [provider]  Finerenone  (KERENDIA ) 10 MG TABS Take 1 tablet (10 mg total) by mouth in the morning. 10/21/23  Yes Furth, Cadence H, PA-C  fluticasone  (FLONASE ) 50 MCG/ACT nasal spray Place 2 sprays into both nostrils daily. 04/11/22  Yes Maribeth Camellia MATSU, MD  Glucagon  (GVOKE HYPOPEN  2-PACK) 0.5 MG/0.1ML SOAJ Inject 1 auto injector subcutaneously as needed for severe hypoglycemia. May repeat x 1 after 15 minutes if needed. 09/25/22  Yes Gretel App, NP  insulin  aspart (NOVOLOG  FLEXPEN) 100 UNIT/ML FlexPen Inject 8-14 Units into the skin 3 (three) times daily with meals. 01/07/24  Yes Therisa Benton PARAS, NP  insulin  degludec (TRESIBA  FLEXTOUCH) 100 UNIT/ML FlexTouch Pen Inject 25 Units into the skin daily. 02/03/24  Yes Patel, Sona, MD  lidocaine  (LIDODERM ) 5 % PLACE 1 PATCH ONTO THE SKIN DAILY. REMOVE & DISCARD PATCH WITHIN 12 HOURS OR AS DIRECTED BY MD 01/16/24  Yes Marylynn Verneita CROME, MD  loperamide  (IMODIUM ) 2 MG capsule Take 4-6 mg by mouth 3 (three) times daily before meals.   Yes [provider]  nitroGLYCERIN  (NITROSTAT ) 0.4 MG SL tablet Place 1 tablet (0.4 mg total) under the tongue every 5 (five) minutes as needed for chest pain. 04/14/23  Yes Marylynn Verneita CROME, MD  nystatin  (MYCOSTATIN /NYSTOP ) powder Apply 1 g topically 4 (four) times daily as needed (rash).   Yes [provider]  ondansetron  (ZOFRAN -ODT) 4 MG disintegrating tablet Take 1 tablet (4 mg total) by mouth every 8 (eight) hours as needed for nausea or vomiting. 07/13/22   Yes Marylynn Verneita CROME, MD  OVER THE COUNTER MEDICATION Apply 1 application  topically daily as needed (pain). [place] Gel topical pain reliever   Yes [provider]  pantoprazole  (PROTONIX ) 40 MG tablet Take 1 tablet (40 mg total) by mouth daily. 03/04/23 03/03/24 Yes [place], PA-C  Polyethyl Glycol-Propyl Glycol (SYSTANE OP) Place 1 drop into both eyes 4 (four) times daily as needed (dry eyes).   Yes [provider]  pregabalin  (LYRICA ) 50 MG capsule Take 1 capsule (50 mg total) by mouth 2 (two) times daily. 08/01/23  Yes Marylynn Verneita CROME, MD  tiZANidine  (ZANAFLEX ) 4 MG tablet Take 1 tablet (4 mg total) by mouth in the morning and at bedtime. 12/19/23  Yes Bair, Kalpana, MD  torsemide  (DEMADEX ) 20 MG tablet Take 1 tablet (20 mg total) by mouth daily. 01/27/24 04/26/24 Yes Sabharwal, Aditya, DO  traMADol  (ULTRAM ) 50 MG tablet Take 1 tablet (50 mg total) by mouth every 6 (six) hours as needed for severe pain (pain score 7-10) or moderate pain (pain score 4-6). 02/03/24  Yes Patel, Sona, MD  allopurinol  (ZYLOPRIM ) 100 MG tablet Take 1 tablet (100 mg total) by mouth daily. Patient not taking: Reported on 03/03/2024 01/24/24   Jeannetta Lonni ORN, MD  Continuous Glucose Sensor (FREESTYLE LIBRE 3 PLUS SENSOR) MISC Change sensor every 15 days. 01/07/24   Therisa Benton PARAS, NP  glucose blood (ONETOUCH VERIO) test strip Use to check blood sugar twice daily. 02/23/22   Marylynn Verneita CROME, MD  Insulin  Pen Needle (DROPLET PEN NEEDLES) 31G X 5 MM MISC USE ONE NEEDLE SUBCUTANEOUSLY AS DIRECTED (REMOVE AND DISCARD NEEDLE IN SHARPS CONTAINER IMMEDIATELY AFTER USE) 09/30/23   Therisa Benton PARAS, NP  OneTouch Delica Lancets 33G MISC Used to check blood sugar two times a day. 02/22/22   Marylynn Verneita CROME, MD  potassium chloride  20 MEQ/15ML (10%) SOLN Take 15 mLs (20 mEq total) by mouth daily. 05/15/23   End,  Lonni, MD    Social History   Socioeconomic History   Marital status: Married    Spouse name:  Koren    Number of children: 2   Years of education: College    Highest education level: Not on file  Occupational History    Employer: retired  Tobacco Use   Smoking status: Never    Passive exposure: Never   Smokeless tobacco: Never  Vaping Use   Vaping status: Never Used  Substance and Sexual Activity   Alcohol  use: No   Drug use: Not Currently    Comment: prescribed valium    Sexual activity: Not Currently    Birth control/protection: Post-menopausal, Surgical  Other Topics Concern   Not on file  Social History Narrative   Patient lives at home with husband Koren.    Right Handed   Drinks caffeinated tea occasionally   One pet in house, dog   Social Drivers of Health   Financial Resource Strain: Low Risk  (09/15/2021)   Overall Financial Resource Strain (CARDIA)    Difficulty of Paying Living Expenses: Not hard at all  Food Insecurity: No Food Insecurity (03/04/2024)   Hunger Vital Sign    Worried About Running Out of Food in the Last Year: Never true    Ran Out of Food in the Last Year: Never true  Transportation Needs: No Transportation Needs (03/04/2024)   PRAPARE - Administrator, Civil Service (Medical): No    Lack of Transportation (Non-Medical): No  Physical Activity: Insufficiently Active (11/15/2020)   Exercise Vital Sign    Days of Exercise per Week: 2 days    Minutes of Exercise per Session: 40 min  Stress: Stress Concern Present (08/03/2020)   [employer] of Occupational Health - Occupational Stress Questionnaire    Feeling of Stress : Rather much  Social Connections: Unknown (03/04/2024)   Social Connection and Isolation Panel    Frequency of Communication with Friends and Family: Patient declined    Frequency of Social Gatherings with Friends and Family: Patient declined    Attends Religious Services: Never    Database administrator or Organizations: Patient declined    Attends Banker Meetings: Patient declined    Marital  Status: Patient declined  Intimate Partner Violence: Not At Risk (03/04/2024)   Humiliation, Afraid, Rape, and Kick questionnaire    Fear of Current or Ex-Partner: No    Emotionally Abused: No    Physically Abused: No    Sexually Abused: No     Family History  Problem Relation Age of Onset   Heart disease Father    Hypertension Father    Prostate cancer Father    Stroke Father    Osteoporosis Father    Stroke Mother    Depression Mother    Headache Mother    Heart disease Mother    Thyroid  disease Mother    Hypertension Mother    Diabetes Daughter    Heart disease Daughter    Hypertension Daughter    Hypertension Son     ROS: Otherwise negative unless mentioned in HPI  Physical Examination  Vitals:   03/06/24 0511 03/06/24 0546  BP: (!) 99/54   Pulse: 78   Resp: (!) 24 (!) 24  Temp: 98.6 F (37 C)   SpO2: 93%    Body mass index is 41.74 kg/m.  General:  WDWN in NAD Gait: Not observed HENT: WNL, normocephalic Pulmonary: normal non-labored breathing, without Rales, rhonchi,  wheezing Cardiac:  regular, without  Murmurs, rubs or gallops; without carotid bruits Abdomen: Positive bowel sounds throughout, soft, NT/ND, no masses Skin: without rashes Vascular Exam/Pulses: Palpable pulses throughout except left foot DP/PT. Unable to palpate these pulses due to +3-4 edema.  Extremities: without ischemic changes, without Gangrene , with cellulitis; with open wounds;  Musculoskeletal: no muscle wasting or atrophy  Neurologic: A&O X 3;  No focal weakness or paresthesias are detected; speech is fluent/normal Psychiatric:  The pt has Normal affect. Lymph:  Unremarkable  CBC    Component Value Date/Time   WBC 12.0 (H) 03/05/2024 0432   RBC 3.50 (L) 03/05/2024 0432   HGB 10.0 (L) 03/05/2024 0432   HGB 11.2 (L) 11/28/2023 1307   HGB 13.1 06/21/2014 1349   HCT 32.2 (L) 03/05/2024 0432   HCT 41.4 06/21/2014 1349   PLT 346 03/05/2024 0432   PLT 396 11/28/2023 1307    PLT 308 06/21/2014 1349   MCV 92.0 03/05/2024 0432   MCV 88 06/21/2014 1349   MCH 28.6 03/05/2024 0432   MCHC 31.1 03/05/2024 0432   RDW 16.0 (H) 03/05/2024 0432   RDW 15.4 (H) 06/21/2014 1349   LYMPHSABS 2.8 03/03/2024 1452   LYMPHSABS 2.3 02/25/2014 2153   MONOABS 0.9 03/03/2024 1452   MONOABS 0.6 02/25/2014 2153   EOSABS 0.1 03/03/2024 1452   EOSABS 0.1 02/25/2014 2153   BASOSABS 0.2 (H) 03/03/2024 1452   BASOSABS 0.1 02/25/2014 2153    BMET    Component Value Date/Time   NA 137 03/05/2024 0432   NA 137 10/21/2023 1417   NA 141 06/21/2014 1349   K 4.0 03/05/2024 0432   K 4.0 06/21/2014 1349   CL 106 03/05/2024 0432   CL 106 06/21/2014 1349   CO2 23 03/05/2024 0432   CO2 24 06/21/2014 1349   GLUCOSE 94 03/05/2024 0432   GLUCOSE 174 (H) 06/21/2014 1349   BUN 21 03/05/2024 0432   BUN 30 (H) 10/21/2023 1417   BUN 12 06/21/2014 1349   CREATININE 1.22 (H) 03/05/2024 0432   CREATININE 1.30 (H) 01/24/2024 1009   CALCIUM  7.9 (L) 03/05/2024 0432   CALCIUM  8.7 06/21/2014 1349   GFRNONAA 45 (L) 03/05/2024 0432   GFRNONAA 41 (L) 11/28/2023 1308   GFRNONAA 57 (L) 06/21/2014 1349   GFRNONAA 55 (L) 02/25/2014 2153   GFRNONAA 70 12/13/2011 0919   GFRAA >60 03/23/2020 1204   GFRAA >60 06/21/2014 1349   GFRAA >60 02/25/2014 2153   GFRAA 80 12/13/2011 0919    COAGS: Lab Results  Component Value Date   INR 1.6 (H) 02/01/2024   INR 1.3 (H) 12/14/2022   INR 1.1 07/25/2022     Non-Invasive Vascular Imaging:   EXAM: MRI OF THE LEFT FOOT WITHOUT CONTRAST   TECHNIQUE: Multiplanar, multisequence MR imaging of the left hindfoot was performed. No intravenous contrast was administered.   COMPARISON:  Radiographs 03/03/2024, CT 01/24/2024 and MRI 05/07/2008.   FINDINGS: Bones/Joint/Cartilage   As stated in the technique section, this examination is targeted to the hindfoot and ankle. The midfoot is included. The forefoot is incompletely visualized.   As shown on prior  imaging, there are severe Charcot changes within the hindfoot and midfoot. There is chronic fragmentation and partial destruction of the metatarsal bases, cuneiform bones, cuboid, navicular, talus and calcaneus. Compared with the previous CT, there is new, near complete medial dislocation at the subtalar joint. There is chronic dorsal and medial dislocation at the transverse tarsal joint. There is nonspecific, patchy T2  hyperintensity throughout the talus without gross bone destruction. There are complex ankle and subtalar joint effusions.   Ligaments   No acute ligamentous findings are demonstrated. The deltoid ligament appears thickened.   Muscles and Tendons   There is a longitudinal split tear the peroneus brevis tendon associated with mild peroneal tendon sheath fluid. The medial flexor tendons are intact, although the posterior tibialis tendon appears stretched due to the subtalar dislocation. The anterior extensor and Achilles tendons are intact. The plantar fascia is intact. Mild generalized muscular edema without apparent focal abnormality.   Soft tissues   Generalized subcutaneous edema. Possible skin ulceration laterally in the hindfoot without evidence of underlying organized fluid collection.   IMPRESSION: 1. Severe Charcot changes within the hindfoot and midfoot with chronic dorsal and medial dislocation at the transverse tarsal joint and new, near complete medial dislocation at the subtalar joint. 2. Nonspecific patchy T2 hyperintensity throughout the talus without gross bone destruction. This could be reactive or secondary to early osteomyelitis. 3. Complex ankle and subtalar joint effusions. 4. Longitudinal split tear of the peroneus brevis tendon. 5. Generalized subcutaneous edema with possible skin ulceration laterally in the hindfoot. Correlate clinically. No evidence of soft tissue abscess.    Statin:  Yes.   Beta Blocker:  No. Aspirin :  No. ACEI:   No. ARB:  No. CCB use:  No Other antiplatelets/anticoagulants:  Yes.   Eliquis  5 mg BID   ASSESSMENT/PLAN: This is a 78 y.o. female who presents to [facility] emergency department for worsening wound to her left foot.  Patient with a history of Charcot foot to the left lower extremity.  I had a long detailed discussion at the bedside this morning with the patient regarding her current pathology and the condition of her left foot.  I believe given the severity of the Charcot complicated by her current cellulitis and underlying osteomyelitis a below-knee amputation would be in her best interest.  I do not believe long-term antibiotics or any treatment will reverse the damage already done to her foot.  We discussed in detail quality of life by not amputating while she is in the hospital versus waiting to do it later.  She endorses she spoke with her family last night and although she does not want to do any amputation she feels like amputation is inevitable at some point in time.  Therefore she agrees to a left below the knee amputation at this time.  She would like to get it done sooner than later.  I discussed in detail with her the procedure, benefits, risk, complications.  Again she wishes to proceed sometime this weekend with amputation.  I answered all her questions today regarding her recovery from surgery and getting her prosthetic in the future.  Patient verbalizes her understanding.  Patient made n.p.o. after midnight tonight for procedure possibly tomorrow or Sunday.   -I discussed the case in detail with Dr. Selinda Gu MD and he agrees with plan.   Gwendlyn JONELLE [facility] 03/06/2024 7:36 AM

## 2024-03-06 NOTE — Progress Notes (Signed)
 [place] resp 24 and HR 61 no distress noted . HR and RR were rechecked 24/64.  Message snent to [facility] APP.

## 2024-03-06 NOTE — Consult Note (Signed)
 Pharmacy Antibiotic Note  Denise Sharp is a 78 y.o. female admitted on 03/03/2024 with left foot infection.  Pharmacy has been consulted for vancomycin  dosing.  MRI imaging of left foot shows severe Charcot changes, findings concerning for early osteomyelitis. Podiatry was consulted and recommends below the knee amputation with ortho. Patient is afebrile today with WBC 12.7. Scr 1.21  Plan: Continue vancomycin  1000 mg IV every 24 hours (eAUC 569.1, Scr 1.21, IBW used, Vd 0.5 L/kg) Continue cefepime  2 g IV every 12 hours Continue to monitor renal function, culture data, clinical status, and antibiotic length of therapy  Height: 5' 4 (162.6 cm) Weight: 110.3 kg (243 lb 2.7 oz) IBW/kg (Calculated) : 54.7  Temp (24hrs), Avg:99 F (37.2 C), Min:98.4 F (36.9 C), Max:100.3 F (37.9 C)  Recent Labs  Lab 03/03/24 1111 03/03/24 1124 03/03/24 1452 03/03/24 1901 03/04/24 0548 03/04/24 0850 03/05/24 0432 03/06/24 0950  WBC  --  12.2* 15.0*  --  15.1*  --  12.0* 12.7*  CREATININE 1.01  --  1.08*  --  1.17*  --  1.22* 1.21*  LATICACIDVEN  --   --  2.0* 2.1*  --  1.3  --   --     Estimated Creatinine Clearance: 46.5 mL/min (A) (by C-G formula based on SCr of 1.21 mg/dL (H)).    Allergies  Allergen Reactions   Amoxicillin  Diarrhea    amoxicillin    Clarithromycin  Nausea And Vomiting and Other (See Comments)    Severe irritable bowel  clarithromycin    Demeclocycline Hives   Erythromycin Nausea And Vomiting and Other (See Comments)    Severe irritable bowel   Flagyl [Metronidazole] Nausea And Vomiting and Other (See Comments)    Severe irritable bowel   Glucophage [Metformin Hcl] Nausea And Vomiting and Other (See Comments)    Sick I won't take anything that has metformin in it   Tetracyclines & Related Hives and Rash   Potassium Chloride  Other (See Comments)    Cannot swallow potssium pills, able to tolerate liquid potassium   Tetracycline Other (See Comments)   Diovan  [Valsartan] Nausea Only        Sulfa  Antibiotics Other (See Comments) and Rash    As child   Xanax [Alprazolam] Other (See Comments)    Hyperactivity     Antimicrobials this admission: Vancomycin  8/19 >>   Dose adjustments this admission: N/A  Microbiology results: 8/19 BCx: 1/4 staph hominis  Thank you for involving pharmacy in this patient's care.   Damien Napoleon, PharmD Clinical Pharmacist 03/06/2024 2:11 PM

## 2024-03-06 NOTE — Plan of Care (Signed)
  Problem: Coping: Goal: Ability to adjust to condition or change in health will improve Outcome: Progressing   Problem: Health Behavior/Discharge Planning: Goal: Ability to identify and utilize available resources and services will improve Outcome: Progressing   Problem: Metabolic: Goal: Ability to maintain appropriate glucose levels will improve Outcome: Progressing   Problem: Nutritional: Goal: Maintenance of adequate nutrition will improve Outcome: Progressing   Problem: Skin Integrity: Goal: Risk for impaired skin integrity will decrease Outcome: Progressing   Problem: Tissue Perfusion: Goal: Adequacy of tissue perfusion will improve Outcome: Progressing

## 2024-03-06 NOTE — Telephone Encounter (Signed)
 Phone call to pt.  Her endocrinologist is Benton Joshua Rio, FNP at [facility].  Last Endo appointment with Endo was 01/07/24, A1C was 7.6 at that appointment, next appointment 05/11/24.   Patient is scheduled to have her foot amputated tomorrow 03/07/24.  She reported that the plan is for her to spent 1 week in hospital to ensure that site is healing properly and scheduled dressing changes.  She will then go to rehab and then home with home health.

## 2024-03-06 NOTE — Plan of Care (Signed)
 Plan to be NPO at midnight for procedure tomorrow. Plan to do BKA in AM

## 2024-03-06 NOTE — Telephone Encounter (Signed)
 Copied from CRM [id number]. Topic: General - Inquiry >> Mar 06, 2024 10:53 AM Laymon HERO wrote: Reason for CRM: [place], [place], CMA she is in the hospital to get her foot amputated this weekend. She missed your call due to her being in the hospital, she is asking for you to call her back

## 2024-03-06 NOTE — Progress Notes (Signed)
 PROGRESS NOTE    Denise Sharp  FMW:[id number] DOB: January 02, 1946 DOA: 03/03/2024 PCP: Marylynn Verneita CROME, MD  Chief Complaint  Patient presents with   Wound Infection    Hospital Course:  [name] is a 78 year old with hypertension, hyperlipidemia, CAD, anxiety, asthma, CKD, who presents from urology for chief concern of worsening wounds of the left foot, reports low grade fever at home, chills and increasing pain with discharge of her left foot. Progressive charcoaled arthropathy vs superimposed OM as per [facility] Hospital course as below   Subjective: Patient was examined at the bedside, states she feels better today, afebrile in last 24 hrs. Left Knee pain and ROM improving Agreeable to Left BKA by Vascular surgery   Objective: Vitals:   03/06/24 0511 03/06/24 0546 03/06/24 0809 03/06/24 1213  BP: (!) 99/54  (!) 106/55 94/62  Pulse: 78  81 69  Resp: (!) 24 (!) 24 19 19   Temp: 98.6 F (37 C)  98.6 F (37 C) 98.5 F (36.9 C)  TempSrc:      SpO2: 93%  94% 96%  Weight:      Height:        Intake/Output Summary (Last 24 hours) at 03/06/2024 1608 Last data filed at 03/06/2024 0900 Gross per 24 hour  Intake 880 ml  Output 950 ml  Net -70 ml   Filed Weights   03/03/24 1826  Weight: 110.3 kg    Examination: Constitutional: appears age-appropriate, NAD, calm Neck: normal, supple, no masses, no thyromegaly Respiratory: clear to auscultation bilaterally, no wheezing, no crackles. Normal respiratory effort. No accessory muscle use.  Cardiovascular: Regular rate and rhythm, no murmurs / rubs / gallops. No extremity edema. 2+ pedal pulses. No carotid bruits.  Abdomen: Morbidly obese abdomen, no tenderness, no masses palpated, no hepatosplenomegaly. Bowel sounds positive.  Musculoskeletal: no clubbing / cyanosis. No joint deformity upper and lower extremities. Good ROM, no contractures, no atrophy. Normal muscle tone.  Skin: + left lateral foot/ankle lesion with increased  erythema and edema Neurologic: Sensation intact. 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 Met SIRS criteria on adm (fever, leukocytosis, lactic acidosis) - Bcx 1/4: GPC (Staph species), likely contaminant - discussed with ID curbside - XR Left foot advanced osseous destructive changes and fragmentation of the midfoot, consistent with Charcot arthropathy.  Suspect progressive erosive change and osseous destruction of calcaneus, talus, cuboid navicular and cuneiform bones.  Increased effusion and soft tissue swelling.  Progressive charcoaled arthropathy vs superimposed OM - MRI Left foot shows severe Charcot changes, findings concerning for early OM vs reactive changes  throughout talus. 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   OSA on CPAP CPAP nightly ordered  NICM with preserved ejection fraction - On torsemide  daily - refusing to take it   Type II diabetes mellitus with neurological manifestations (HCC) Home long acting insulin , dec Lantus  25u to 12 units for plan for surgery tomorrow Insulin  SSI with HS coverage Goal inpatient blood glucose level 140-180   Chronic anxiety Home duloxetine  60 mg daily resumed   Hyperlipidemia Home atorvastatin  80 mg nightly resumed   Major depressive disorder with current active episode Home duloxetine  60 mg daily resumed  CKD stage 3a Monitor Cr, baseline appears 1.1-1.3   Morbid obesity (HCC) This complicates overall care and prognosis.    History of gout Outpatient 01/24/2024 Rheumatology note reviewed: Allopurinol  100 mg daily resumed, continue colchicine  as needed for inflammation Allopurinol  100 mg daily resume; discontinue scheduled colchicine    GAD (generalized anxiety disorder) PDMP reviewed  Obesity hypoventilation syndrome (HCC) Cpap at bedtime ordered   Weakness Fall precaution PT/OT rec SNF when ready  DVT prophylaxis: Eliquis    Code Status: Full Code Disposition:  TBD  Consultants:  Podiatry Vascular Surgery  Procedures:  None  Antimicrobials:  Anti-infectives (From admission, onward)    Start     Dose/Rate Route Frequency Ordered Stop   03/06/24 0500  ceFEPIme  (MAXIPIME ) 2 g in sodium chloride  0.9 % 100 mL IVPB        2 g 200 mL/hr over 30 Minutes Intravenous Every 12 hours 03/05/24 2309     03/05/24 1700  vancomycin  (VANCOCIN ) IVPB 1000 mg/200 mL premix        1,000 mg 200 mL/hr over 60 Minutes Intravenous Every 24 hours 03/05/24 0813      03/05/24 1200  ceFEPIme  (MAXIPIME ) 2 g in sodium chloride  0.9 % 100 mL IVPB  Status:  Discontinued        2 g 200 mL/hr over 30 Minutes Intravenous 2 times daily 03/05/24 1128 03/05/24 2309   03/04/24 1700  vancomycin  (VANCOREADY) IVPB 1000 mg/200 mL  Status:  Discontinued        1,000 mg 200 mL/hr over 60 Minutes Intravenous Every 24 hours 03/03/24 1738 03/05/24 0813   03/03/24 1730  vancomycin  (VANCOREADY) IVPB 2000 mg/400 mL        2,000 mg 200 mL/hr over 120 Minutes Intravenous  Once 03/03/24 1729 03/04/24 0534   03/03/24 1645  vancomycin  (VANCOCIN ) IVPB 1000 mg/200 mL premix  Status:  Discontinued        1,000 mg 200 mL/hr over 60 Minutes Intravenous  Once 03/03/24 1635 03/03/24 1729       Data Reviewed: I have personally reviewed following labs and imaging studies CBC: Recent Labs  Lab 03/03/24 1124 03/03/24 1452 03/04/24 0548 03/05/24 0432 03/06/24 0950  WBC 12.2* 15.0* 15.1* 12.0* 12.7*  NEUTROABS 8.6* 10.8*  --   --   --  HGB 11.9* 12.5 10.3* 10.0* 9.5*  HCT 38.2 40.7 32.8* 32.2* 30.4*  MCV 89.3 93.8 91.6 92.0 91.0  PLT 388.0 421* 366 346 339   Basic Metabolic Panel: Recent Labs  Lab 03/03/24 1111 03/03/24 1452 03/04/24 0548 03/05/24 0432 03/06/24 0950  NA 140 138 141 137 134*  K 3.9 3.8 4.1 4.0 4.0  CL 103 105 109 106 105  CO2 25 22 23 23  20*  GLUCOSE 192* 271* 132* 94 154*  BUN 10 11 16 21 21   CREATININE 1.01 1.08* 1.17* 1.22* 1.21*  CALCIUM  8.4 8.6* 8.2* 7.9* 8.4*   GFR: Estimated Creatinine Clearance: 46.5 mL/min (A) (by C-G formula based on SCr of 1.21 mg/dL (H)). Liver Function Tests: Recent Labs  Lab 03/03/24 1452  AST 18  ALT 14  ALKPHOS 165*  BILITOT 0.7  PROT 6.9  ALBUMIN  2.9*   CBG: Recent Labs  Lab 03/05/24 1237 03/05/24 1720 03/05/24 2138 03/06/24 0737 03/06/24 1203  GLUCAP 120* 97 192* 122* 138*    Recent Results (from the past 240 hours)  Blood culture (routine x 2)     Status: Abnormal   Collection Time:  03/03/24  5:01 PM   Specimen: BLOOD RIGHT FOREARM  Result Value Ref Range Status   Specimen Description   Final    BLOOD RIGHT FOREARM Performed at [facility], [address], [place], [place] [address]    Special Requests   Final    BOTTLES DRAWN AEROBIC AND ANAEROBIC Blood Culture results may not be optimal due to an inadequate volume of blood received in culture bottles Performed at [facility], [address] [place], [place] [address]    Culture  Setup Time   Final    GRAM POSITIVE COCCI ANAEROBIC BOTTLE ONLY CRITICAL RESULT CALLED TO, READ BACK BY AND VERIFIED WITH: TIFFANY GILCHRIST, PHARMD AT 1010 ON 03/04/24 GM    Culture (A)  Final    STAPHYLOCOCCUS HOMINIS THE SIGNIFICANCE OF ISOLATING THIS ORGANISM FROM A SINGLE SET OF BLOOD CULTURES WHEN MULTIPLE SETS ARE DRAWN IS UNCERTAIN. PLEASE NOTIFY THE MICROBIOLOGY DEPARTMENT WITHIN ONE WEEK IF SPECIATION AND SENSITIVITIES ARE REQUIRED. Performed at [facility], [address] [place], [place] [address]    Report Status 03/06/2024 FINAL  Final  Blood Culture ID Panel (Reflexed)     Status: Abnormal   Collection Time: 03/03/24  5:01 PM  Result Value Ref Range Status   Enterococcus faecalis NOT DETECTED NOT DETECTED Final   Enterococcus Faecium NOT DETECTED NOT DETECTED Final   Listeria monocytogenes NOT DETECTED NOT DETECTED Final   Staphylococcus species DETECTED (A) NOT DETECTED Final    Comment: CRITICAL RESULT CALLED TO, READ BACK BY AND VERIFIED WITH: TIFFANY GILCHRIST, PHARMD AT 1010 ON 03/04/24 GM    Staphylococcus aureus (BCID) NOT DETECTED NOT DETECTED Final   Staphylococcus epidermidis NOT DETECTED NOT DETECTED Final   Staphylococcus lugdunensis NOT DETECTED NOT DETECTED Final   Streptococcus species NOT DETECTED NOT DETECTED Final   Streptococcus agalactiae NOT DETECTED NOT DETECTED Final   Streptococcus pneumoniae NOT DETECTED NOT DETECTED Final   Streptococcus pyogenes NOT DETECTED NOT  DETECTED Final   A.calcoaceticus-baumannii NOT DETECTED NOT DETECTED Final   Bacteroides fragilis NOT DETECTED NOT DETECTED Final   Enterobacterales NOT DETECTED NOT DETECTED Final   Enterobacter cloacae complex NOT DETECTED NOT DETECTED Final   Escherichia coli NOT DETECTED NOT DETECTED Final   Klebsiella aerogenes NOT DETECTED NOT DETECTED Final   Klebsiella oxytoca NOT  DETECTED NOT DETECTED Final   Klebsiella pneumoniae NOT DETECTED NOT DETECTED Final   Proteus species NOT DETECTED NOT DETECTED Final   Salmonella species NOT DETECTED NOT DETECTED Final   Serratia marcescens NOT DETECTED NOT DETECTED Final   Haemophilus influenzae NOT DETECTED NOT DETECTED Final   Neisseria meningitidis NOT DETECTED NOT DETECTED Final   Pseudomonas aeruginosa NOT DETECTED NOT DETECTED Final   Stenotrophomonas maltophilia NOT DETECTED NOT DETECTED Final   Candida albicans NOT DETECTED NOT DETECTED Final   Candida auris NOT DETECTED NOT DETECTED Final   Candida glabrata NOT DETECTED NOT DETECTED Final   Candida krusei NOT DETECTED NOT DETECTED Final   Candida parapsilosis NOT DETECTED NOT DETECTED Final   Candida tropicalis NOT DETECTED NOT DETECTED Final   Cryptococcus neoformans/gattii NOT DETECTED NOT DETECTED Final    Comment: Performed at [facility], [address] [place], [place] [address]  Blood culture (routine x 2)     Status: None (Preliminary result)   Collection Time: 03/03/24  7:01 PM   Specimen: BLOOD  Result Value Ref Range Status   Specimen Description BLOOD BLOOD RIGHT HAND  Final   Special Requests   Final    BOTTLES DRAWN AEROBIC AND ANAEROBIC Blood Culture results may not be optimal due to an inadequate volume of blood received in culture bottles   Culture   Final    NO GROWTH 3 DAYS Performed at [facility], [address], [place], [place] [address]    Report Status PENDING  Incomplete     Radiology Studies: DG Knee 1-2 Views Left Result Date:  03/05/2024 CLINICAL DATA:  Left knee pain. EXAM: LEFT KNEE - 1-2 VIEW COMPARISON:  None Available. FINDINGS: There is a total left knee arthroplasty. The arthroplasty components appear intact and in anatomic alignment. There is no acute fracture or dislocation. The bones are osteopenic. A small suprapatellar effusion may be present. The soft tissues are unremarkable. IMPRESSION: 1. No acute fracture or dislocation. 2. Total left knee arthroplasty. Electronically Signed   By: Vanetta Chou M.D.   On: 03/05/2024 14:21    Scheduled Meds:  allopurinol   100 mg Oral Daily   atorvastatin   80 mg Oral QHS   chlorhexidine   60 mL Topical Once   And   [START ON 03/07/2024] chlorhexidine   60 mL Topical Once   dapagliflozin  propanediol  10 mg Oral Daily   DULoxetine   60 mg Oral Daily   ezetimibe   10 mg Oral Daily   Finerenone   10 mg Oral q AM   fluticasone  furoate-vilanterol  1 puff Inhalation Daily   insulin  aspart  0-15 Units Subcutaneous TID WC   insulin  aspart  0-5 Units Subcutaneous QHS   insulin  glargine  25 Units Subcutaneous Q2200   pantoprazole   40 mg Oral Daily   pregabalin   50 mg Oral BID   tiZANidine   4 mg Oral BID   torsemide   20 mg Oral Daily   Continuous Infusions:  ceFEPime  (MAXIPIME ) IV 2 g (03/06/24 0454)   vancomycin  1,000 mg (03/05/24 1657)     LOS: 3 days  MDM: Patient is high risk for one or more organ failure.  They necessitate ongoing hospitalization for continued IV therapies and subsequent lab monitoring. Total time spent interpreting labs and vitals, reviewing the medical record, coordinating care amongst consultants and care team members, directly assessing and discussing care with the patient and/or family: 55 min  Laree Lock, MD [facility]  To contact the attending physician between  7A-7P please use Epic Chat. To contact the covering physician during after hours 7P-7A, please review Amion.  03/06/2024, 4:08 PM   *This document has been created with the  assistance of dictation software. Please excuse typographical errors. *

## 2024-03-06 NOTE — Telephone Encounter (Signed)
 See documentation in 03/02/24 encounter.

## 2024-03-07 ENCOUNTER — Encounter: Payer: Self-pay | Admitting: Internal Medicine

## 2024-03-07 ENCOUNTER — Other Ambulatory Visit: Payer: Self-pay

## 2024-03-07 ENCOUNTER — Encounter: Admission: EM | Disposition: A | Discharge status: Skilled Nursing Facility | Payer: Self-pay | Source: Home / Self Care

## 2024-03-07 ENCOUNTER — Inpatient Hospital Stay: Payer: Self-pay | Admitting: Anesthesiology

## 2024-03-07 DIAGNOSIS — A5216 Charcot's arthropathy (tabetic): Secondary | ICD-10-CM

## 2024-03-07 DIAGNOSIS — L03116 Cellulitis of left lower limb: Secondary | ICD-10-CM | POA: Diagnosis not present

## 2024-03-07 DIAGNOSIS — I96 Gangrene, not elsewhere classified: Secondary | ICD-10-CM

## 2024-03-07 DIAGNOSIS — L089 Local infection of the skin and subcutaneous tissue, unspecified: Secondary | ICD-10-CM

## 2024-03-07 HISTORY — PX: AMPUTATION: SHX166

## 2024-03-07 LAB — GLUCOSE, CAPILLARY
Glucose-Capillary: 107 mg/dL — ABNORMAL HIGH (ref 70–99)
Glucose-Capillary: 119 mg/dL — ABNORMAL HIGH (ref 70–99)
Glucose-Capillary: 143 mg/dL — ABNORMAL HIGH (ref 70–99)
Glucose-Capillary: 203 mg/dL — ABNORMAL HIGH (ref 70–99)
Glucose-Capillary: 147 mg/dL — ABNORMAL HIGH (ref 70–99)

## 2024-03-07 SURGERY — AMPUTATION BELOW KNEE
Anesthesia: General | Site: Leg Lower | Laterality: Left

## 2024-03-07 MED ORDER — DROPERIDOL 2.5 MG/ML IJ SOLN: 0.6250 mg | Freq: Once | INTRAMUSCULAR | Status: DC | PRN

## 2024-03-07 MED ORDER — ONDANSETRON HCL 4 MG/2ML IJ SOLN
INTRAMUSCULAR | Status: DC | PRN
  Administered 2024-03-07: 4 mg via INTRAVENOUS

## 2024-03-07 MED ORDER — ROCURONIUM BROMIDE 100 MG/10ML IV SOLN
INTRAVENOUS | Status: DC | PRN
  Administered 2024-03-07: 40 mg via INTRAVENOUS

## 2024-03-07 MED ORDER — OXYCODONE HCL 5 MG PO TABS
ORAL_TABLET | ORAL | Status: AC
  Filled 2024-03-07: qty 1

## 2024-03-07 MED ORDER — PHENYLEPHRINE 80 MCG/ML (10ML) SYRINGE FOR IV PUSH (FOR BLOOD PRESSURE SUPPORT)
PREFILLED_SYRINGE | INTRAVENOUS | Status: DC | PRN
  Administered 2024-03-07: 160 ug via INTRAVENOUS
  Administered 2024-03-07: 80 ug via INTRAVENOUS
  Administered 2024-03-07 (×2): 160 ug via INTRAVENOUS
  Administered 2024-03-07: 80 ug via INTRAVENOUS

## 2024-03-07 MED ORDER — ONDANSETRON HCL 4 MG/2ML IJ SOLN
INTRAMUSCULAR | Status: AC
  Filled 2024-03-07: qty 2

## 2024-03-07 MED ORDER — PROPOFOL 10 MG/ML IV BOLUS
INTRAVENOUS | Status: DC | PRN
  Administered 2024-03-07: 130 mg via INTRAVENOUS

## 2024-03-07 MED ORDER — FENTANYL CITRATE (PF) 100 MCG/2ML IJ SOLN
INTRAMUSCULAR | Status: DC | PRN
  Administered 2024-03-07 (×2): 50 ug via INTRAVENOUS

## 2024-03-07 MED ORDER — LIDOCAINE HCL (PF) 2 % IJ SOLN
INTRAMUSCULAR | Status: AC
  Filled 2024-03-07: qty 5

## 2024-03-07 MED ORDER — HYDROMORPHONE HCL 1 MG/ML IJ SOLN
0.5000 mg | Freq: Four times a day (QID) | INTRAMUSCULAR | Status: DC | PRN
  Administered 2024-03-07 (×2): 0.5 mg via INTRAVENOUS
  Filled 2024-03-07 (×2): qty 0.5

## 2024-03-07 MED ORDER — SUGAMMADEX SODIUM 200 MG/2ML IV SOLN
INTRAVENOUS | Status: DC | PRN
  Administered 2024-03-07: 200 mg via INTRAVENOUS

## 2024-03-07 MED ORDER — FENTANYL CITRATE (PF) 100 MCG/2ML IJ SOLN
INTRAMUSCULAR | Status: AC
Start: 2024-03-07 — End: 2024-03-07
  Filled 2024-03-07: qty 2

## 2024-03-07 MED ORDER — PROPOFOL 1000 MG/100ML IV EMUL
INTRAVENOUS | Status: AC
  Filled 2024-03-07: qty 100

## 2024-03-07 MED ORDER — LIDOCAINE HCL (CARDIAC) PF 100 MG/5ML IV SOSY
PREFILLED_SYRINGE | INTRAVENOUS | Status: DC | PRN
  Administered 2024-03-07: 100 mg via INTRAVENOUS

## 2024-03-07 MED ORDER — BUPIVACAINE LIPOSOME 1.3 % IJ SUSP
INTRAMUSCULAR | Status: DC | PRN
Start: 2024-03-07 — End: 2024-03-07
  Administered 2024-03-07: 20 mL

## 2024-03-07 MED ORDER — INSULIN GLARGINE 100 UNIT/ML ~~LOC~~ SOLN
25.0000 [IU] | Freq: Every day | SUBCUTANEOUS | Status: DC
  Administered 2024-03-07: 25 [IU] via SUBCUTANEOUS
  Filled 2024-03-07 (×3): qty 0.25

## 2024-03-07 MED ORDER — HYDROXYZINE HCL 50 MG PO TABS
25.0000 mg | ORAL_TABLET | Freq: Three times a day (TID) | ORAL | Status: DC | PRN
  Administered 2024-03-07 – 2024-03-08 (×3): 50 mg via ORAL
  Filled 2024-03-07 (×3): qty 1

## 2024-03-07 MED ORDER — 0.9 % SODIUM CHLORIDE (POUR BTL) OPTIME
TOPICAL | Status: DC | PRN
Start: 2024-03-07 — End: 2024-03-07
  Administered 2024-03-07: 1000 mL

## 2024-03-07 MED ORDER — DOCUSATE SODIUM 100 MG PO CAPS
100.0000 mg | ORAL_CAPSULE | Freq: Every day | ORAL | Status: DC
  Administered 2024-03-08 – 2024-03-12 (×5): 100 mg via ORAL
  Filled 2024-03-07 (×5): qty 1

## 2024-03-07 MED ORDER — SODIUM CHLORIDE 0.9 % IV SOLN: INTRAVENOUS | Status: DC | PRN

## 2024-03-07 MED ORDER — FENTANYL CITRATE (PF) 100 MCG/2ML IJ SOLN
INTRAMUSCULAR | Status: AC
  Filled 2024-03-07: qty 2

## 2024-03-07 MED ORDER — FENTANYL CITRATE (PF) 100 MCG/2ML IJ SOLN
25.0000 ug | INTRAMUSCULAR | Status: DC | PRN
  Administered 2024-03-07 (×5): 25 ug via INTRAVENOUS

## 2024-03-07 MED ORDER — NALBUPHINE HCL 10 MG/ML IJ SOLN
5.0000 mg | INTRAMUSCULAR | Status: DC | PRN
  Administered 2024-03-07: 5 mg via INTRAVENOUS
  Filled 2024-03-07 (×2): qty 0.5

## 2024-03-07 MED ORDER — OXYCODONE HCL 5 MG PO TABS
5.0000 mg | ORAL_TABLET | Freq: Once | ORAL | Status: AC
  Administered 2024-03-07: 5 mg via ORAL

## 2024-03-07 SURGICAL SUPPLY — 42 items
BLADE SAW SAG 29X58X.64 (BLADE) ×2 IMPLANT
BNDG COHESIVE 3X5 TAN ST LF (GAUZE/BANDAGES/DRESSINGS) ×2 IMPLANT
BNDG COHESIVE 4X5 TAN STRL LF (GAUZE/BANDAGES/DRESSINGS) ×2 IMPLANT
BNDG COMPR 6X5.8 VLCR NS LF (GAUZE/BANDAGES/DRESSINGS) IMPLANT
BNDG ELASTIC 4X5.8 VLCR NS LF (GAUZE/BANDAGES/DRESSINGS) IMPLANT
BNDG GAUZE DERMACEA FLUFF 4 (GAUZE/BANDAGES/DRESSINGS) ×4 IMPLANT
CHLORAPREP W/TINT 26 (MISCELLANEOUS) ×4 IMPLANT
CUFF TRNQT CYL 34X4.125X (TOURNIQUET CUFF) IMPLANT
DRAPE INCISE IOBAN 66X45 STRL (DRAPES) ×2 IMPLANT
DRAPE INCISE IOBAN 66X60 STRL (DRAPES) ×2 IMPLANT
DRSG GAUZE FLUFF 36X18 (GAUZE/BANDAGES/DRESSINGS) ×2 IMPLANT
ELECT CAUTERY BLADE 6.4 (BLADE) ×2 IMPLANT
ELECTRODE REM PT RTRN 9FT ADLT (ELECTROSURGICAL) ×2 IMPLANT
GAUZE SPONGE 4X4 12PLY STRL (GAUZE/BANDAGES/DRESSINGS) ×2 IMPLANT
GAUZE XEROFORM 1X8 LF (GAUZE/BANDAGES/DRESSINGS) ×4 IMPLANT
GLOVE BIOGEL PI IND STRL 7.0 (GLOVE) ×2 IMPLANT
GLOVE SURG SYN 6.5 PF PI (GLOVE) ×2 IMPLANT
GOWN STRL REUS W/ TWL LRG LVL3 (GOWN DISPOSABLE) ×4 IMPLANT
HANDLE YANKAUER SUCT BULB TIP (MISCELLANEOUS) ×2 IMPLANT
IMMBOLIZER KNEE 19 BLUE UNIV (SOFTGOODS) IMPLANT
KIT TURNOVER KIT A (KITS) ×2 IMPLANT
LABEL OR SOLS (LABEL) ×2 IMPLANT
MANIFOLD NEPTUNE II (INSTRUMENTS) ×2 IMPLANT
NDL HYPO 22X1.5 SAFETY MO (MISCELLANEOUS) IMPLANT
NEEDLE HYPO 22X1.5 SAFETY MO (MISCELLANEOUS) IMPLANT
NS IRRIG 1000ML POUR BTL (IV SOLUTION) ×2 IMPLANT
PACK EXTREMITY ARMC (MISCELLANEOUS) ×2 IMPLANT
PAD ABD DERMACEA PRESS 5X9 (GAUZE/BANDAGES/DRESSINGS) ×4 IMPLANT
PAD ARMBOARD POSITIONER FOAM (MISCELLANEOUS) IMPLANT
PAD PREP OB/GYN DISP 24X41 (PERSONAL CARE ITEMS) ×2 IMPLANT
PENCIL SMOKE EVACUATOR (MISCELLANEOUS) ×2 IMPLANT
SPONGE T-LAP 18X18 ~~LOC~~+RFID (SPONGE) ×4 IMPLANT
STAPLER SKIN PROX 35W (STAPLE) ×2 IMPLANT
STOCKINETTE IMPERVIOUS 9X36 MD (GAUZE/BANDAGES/DRESSINGS) ×2 IMPLANT
SUT SILK 2 0 SH (SUTURE) ×4 IMPLANT
SUT SILK 2-0 18XBRD TIE 12 (SUTURE) ×2 IMPLANT
SUT VIC AB 0 CT1 36 (SUTURE) ×8 IMPLANT
SUT VIC AB 2-0 CT1 (SUTURE) ×2 IMPLANT
SUT VIC AB 3-0 SH 27X BRD (SUTURE) ×4 IMPLANT
SYR 20ML LL LF (SYRINGE) IMPLANT
TRAP FLUID SMOKE EVACUATOR (MISCELLANEOUS) ×2 IMPLANT
WATER STERILE IRR 500ML POUR (IV SOLUTION) ×2 IMPLANT

## 2024-03-07 NOTE — Plan of Care (Signed)
  Problem: Coping: Goal: Ability to adjust to condition or change in health will improve Outcome: Progressing   Problem: Fluid Volume: Goal: Ability to maintain a balanced intake and output will improve Outcome: Progressing   Problem: Health Behavior/Discharge Planning: Goal: Ability to identify and utilize available resources and services will improve Outcome: Progressing Goal: Ability to manage health-related needs will improve Outcome: Progressing   Problem: Metabolic: Goal: Ability to maintain appropriate glucose levels will improve Outcome: Progressing   Problem: Nutritional: Goal: Maintenance of adequate nutrition will improve Outcome: Progressing Goal: Progress toward achieving an optimal weight will improve Outcome: Progressing   Problem: Skin Integrity: Goal: Risk for impaired skin integrity will decrease Outcome: Progressing   Problem: Tissue Perfusion: Goal: Adequacy of tissue perfusion will improve Outcome: Progressing   Problem: Education: Goal: Knowledge of General Education information will improve Description: Including pain rating scale, medication(s)/side effects and non-pharmacologic comfort measures Outcome: Progressing   Problem: Health Behavior/Discharge Planning: Goal: Ability to manage health-related needs will improve Outcome: Progressing   Problem: Clinical Measurements: Goal: Ability to maintain clinical measurements within normal limits will improve Outcome: Progressing Goal: Will remain free from infection Outcome: Progressing Goal: Diagnostic test results will improve Outcome: Progressing Goal: Respiratory complications will improve Outcome: Progressing Goal: Cardiovascular complication will be avoided Outcome: Progressing   Problem: Activity: Goal: Risk for activity intolerance will decrease Outcome: Progressing   Problem: Nutrition: Goal: Adequate nutrition will be maintained Outcome: Progressing   Problem: Coping: Goal:  Level of anxiety will decrease Outcome: Progressing   Problem: Elimination: Goal: Will not experience complications related to bowel motility Outcome: Progressing Goal: Will not experience complications related to urinary retention Outcome: Progressing   Problem: Pain Managment: Goal: General experience of comfort will improve and/or be controlled Outcome: Progressing   Problem: Safety: Goal: Ability to remain free from injury will improve Outcome: Progressing   Problem: Skin Integrity: Goal: Risk for impaired skin integrity will decrease Outcome: Progressing   Problem: Clinical Measurements: Goal: Ability to avoid or minimize complications of infection will improve Outcome: Progressing   Problem: Skin Integrity: Goal: Skin integrity will improve Outcome: Progressing

## 2024-03-07 NOTE — Op Note (Signed)
   OPERATIVE NOTE   PROCEDURE: Left below-the-knee amputation  PRE-OPERATIVE DIAGNOSIS: Left foot gangrene  POST-OPERATIVE DIAGNOSIS: same as above  SURGEON: Curry Beat, MD  ASSISTANT(S): none  ANESTHESIA: general  ESTIMATED BLOOD LOSS: 400 cc  FINDING(S): Viable muscle and tissues in stump; generalized oozing  SPECIMEN(S):  Left below-the-knee amputation  INDICATIONS:   Denise Sharp is a 78 y.o. female who presents with left Foot Infection with Severe Charcot Neuroarthropathy .  The patient is scheduled for a left below-the-knee amputation.  I discussed in depth with the patient the risks, benefits, and alternatives to this procedure.  The patient is aware that the risk of this operation included but are not limited to:  bleeding, infection, myocardial infarction, stroke, death, failure to heal amputation wound, and possible need for more proximal amputation.  The patient is aware of the risks and agrees proceed forward with the procedure.  DESCRIPTION:  After full informed written consent was obtained from the patient, the patient was brought back to the operating room, and placed supine upon the operating table.  Prior to induction, the patient received IV antibiotics.  The patient was then prepped and draped in the standard fashion for a below-the-knee amputation.  After obtaining adequate anesthesia, the patient was prepped and draped in the standard fashion for a left below-the-knee amputation.  I marked out the anterior incision two finger breadths below the tibial tuberosity and then the marked out a posterior flap that was one third of the circumference of the calf in length.   I made the incisions for these flaps, and then dissected through the subcutaneous tissue, fascia, and muscle anteriorly.  I elevated  the periosteal tissue superiorly so that the tibia was about 3-4 cm shorter than the anterior skin flap.  I then transected the tibia with a power saw and then took a  wedge off the tibia anteriorly with the power saw.  Then I smoothed out the rough edges.  In a similar fashion, I cut back the fibula about two centimeters higher than the level of the tibia with a bone cutter.  I put a bone hook into the distal tibia and then used a large amputation knife to sharply develop a tissue plane through the muscle along the fibula.  In such fashion, the posterior flap was developed.  At this point, the specimen was passed off the field as the below-the-knee amputation.  At this point, I clamped all visibly bleeding arteries and veins using a combination of suture ligation with Silk suture and electrocautery.  Bleeding continued to be controlled with electrocautery and suture ligature.  The stump was washed off with sterile normal saline and no further active bleeding was noted.  I reapproximated the anterior and posterior fascia  with interrupted stitches of 0 Vicryl.  This was completed along the entire length of anterior and posterior fascia until there were no more loose space in the fascial line. I then placed a layer of 2-0 Vicryl sutures in the subcutaneous tissue. The skin was then  reapproximated with staples.  The stump was washed off and dried.  The incision was dressed with Xeroform and  then fluffs were applied.  Kerlix was wrapped around the leg and then gently an ACE wrap was applied.    COMPLICATIONS: none  CONDITION: stable   Denise Sharp A  03/07/2024, 11:29 AM    This note was created with Dragon Medical transcription system. Any errors in dictation are purely unintentional.

## 2024-03-07 NOTE — Progress Notes (Signed)
 Pt is offsite for L BKA. Pt's family was at bedside. Pt has signed consents. Will continue to monitor.

## 2024-03-07 NOTE — Anesthesia Postprocedure Evaluation (Signed)
 Anesthesia Post Note  Patient: Denise Sharp  Procedure(s) Performed: AMPUTATION BELOW KNEE (Left: Leg Lower)  Patient location during evaluation: PACU Anesthesia Type: General Level of consciousness: awake and alert Pain management: pain level controlled Vital Signs Assessment: post-procedure vital signs reviewed and stable Respiratory status: spontaneous breathing, nonlabored ventilation, respiratory function stable and patient connected to nasal cannula oxygen  Cardiovascular status: blood pressure returned to baseline and stable Postop Assessment: no apparent nausea or vomiting Anesthetic complications: no   No notable events documented.   Last Vitals:  Vitals:   03/07/24 1045 03/07/24 1116  BP: (!) 98/52 (!) 96/47  Pulse: 74 75  Resp: 11 18  Temp:  36.7 C  SpO2: 98% 92%    Last Pain:  Vitals:   03/07/24 1459  TempSrc:   PainSc: 10-Worst pain ever                 Prentice Murphy

## 2024-03-07 NOTE — Progress Notes (Signed)
 PROGRESS NOTE    Denise Sharp  FMW:[id number] DOB: [birth year] DOA: 03/03/2024 PCP: Marylynn Verneita CROME, MD  Chief Complaint  Denise Sharp presents with   Wound Infection    Hospital Course:  Denise Sharp is a 78 year old with hypertension, hyperlipidemia, CAD, anxiety, asthma, CKD, who presents from urology for chief concern of worsening wounds of the left foot, reports low grade fever at home, chills and increasing pain with discharge of her left foot. Progressive charcoaled arthropathy vs superimposed OM as per XR. MRI with worsening Charot joint and possible OM, Podiatry recommend Left BKA which Denise Sharp underwent on 08/23 by Vascular Surgery Hospital course as below   Subjective: Denise Sharp was examined at the bedside, underwent Left BKA, has phantom pain Husband present at the bedside, discussed clinical course and answered questions    Objective: Vitals:   03/07/24 1039 03/07/24 1045 03/07/24 1116 03/07/24 1623  BP:  (!) 98/52 (!) 96/47 123/69  Pulse:  74 75 87  Resp:  11 18 18   Temp:   98 F (36.7 C) 98 F (36.7 C)  TempSrc:      SpO2: 93% 98% 92% 94%  Weight:      Height:        Intake/Output Summary (Last 24 hours) at 03/07/2024 1701 Last data filed at 03/07/2024 1300 Gross per 24 hour  Intake 1340 ml  Output 2600 ml  Net -1260 ml   Filed Weights   03/03/24 1826  Weight: 110.3 kg    Examination: Constitutional: appears age-appropriate, NAD, calm Neck: normal, supple, no masses, no thyromegaly Respiratory: clear to auscultation bilaterally, no wheezing, no crackles. Normal respiratory effort. No accessory muscle use.  Cardiovascular: Regular rate and rhythm, no murmurs / rubs / gallops. No extremity edema. 2+ pedal pulses. No carotid bruits.  Abdomen: Morbidly obese abdomen, no tenderness, no masses palpated, no hepatosplenomegaly. Bowel sounds positive.  Musculoskeletal: no clubbing / cyanosis. No joint deformity upper and lower extremities. Good ROM, no  contractures, no atrophy. Normal muscle tone.  Skin: + left lateral foot/ankle lesion with increased erythema and edema Neurologic: Sensation intact. 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 Met SIRS criteria on adm (fever, leukocytosis, lactic acidosis) - Bcx 1/4: GPC (Staph species), likely contaminant - discussed with ID curbside - XR Left foot advanced osseous destructive changes and fragmentation of the midfoot, consistent with Charcot arthropathy.  Suspect progressive erosive change and osseous destruction of calcaneus, talus, cuboid navicular and cuneiform bones.  Increased effusion and soft tissue swelling.  Progressive charcoaled arthropathy vs superimposed OM - MRI  Left foot shows severe Charcot changes, findings concerning for early OM vs reactive changes throughout talus. No evidence of abscess - Seen by Podiatry, recommend Left BKA - Seen by Vascular surgery, s/p Left BKA 08/23, pathology sent - Eliquis  held - On IV Vancomycin , add IV Cefepime   LUE cellulitis - improving - On abx  as above  Left knee pain s/p Total left knee arthroplasty - improved - XR No acute fracture or dislocation - Pain management  Hypotension Hx HTN Reports having BP discrepancy, higher in LE > UE chronically low SBP in 90's S/p IV Albumin , avoid IV fluids due to hx CHF Monitor BP   Persistent atrial fibrillation (HCC) - Eliquis  held for Left BKA   Iron  deficiency anemia Denise Sharp reports that she gets iron  infusion Outpatient follow-up as above.  NICM with preserved ejection fraction - On torsemide  daily - refusing to take it   Type II diabetes mellitus with neurological manifestations (HCC) Home long acting insulin , resume Lantus  25u Insulin  SSI with HS coverage Goal inpatient blood glucose level 140-180   Chronic anxiety Home duloxetine  60 mg daily resumed   Hyperlipidemia Home atorvastatin  80 mg nightly resumed   Major depressive disorder with current active episode Home duloxetine  60 mg daily resumed  CKD stage 3a Monitor Cr, baseline appears 1.1-1.3   Morbid obesity This complicates overall care and prognosis.    History of gout Outpatient 01/24/2024 Rheumatology note reviewed: Allopurinol  100 mg daily resumed, continue colchicine  as needed for inflammation Allopurinol  100 mg daily resume; discontinue scheduled colchicine    GAD (generalized anxiety disorder) PDMP reviewed  Obesity hypoventilation syndrome (HCC) Cpap at bedtime ordered   Weakness Fall precaution PT/OT rec SNF when ready  DVT prophylaxis: Eliquis    Code Status: Full Code Disposition:  TBD  Consultants:  Podiatry Vascular Surgery  Procedures:  Left BKA  Antimicrobials:  Anti-infectives (From admission, onward)    Start     Dose/Rate Route Frequency Ordered Stop   03/06/24 0500  ceFEPIme  (MAXIPIME ) 2 g in sodium chloride  0.9 % 100 mL IVPB        2 g 200 mL/hr over 30 Minutes Intravenous Every 12 hours 03/05/24 2309     03/05/24 1700  vancomycin  (VANCOCIN ) IVPB 1000 mg/200 mL  premix        1,000 mg 200 mL/hr over 60 Minutes Intravenous Every 24 hours 03/05/24 0813     03/05/24 1200  ceFEPIme  (MAXIPIME ) 2 g in sodium chloride  0.9 % 100 mL IVPB  Status:  Discontinued        2 g 200 mL/hr over 30 Minutes Intravenous 2 times daily 03/05/24 1128 03/05/24 2309   03/04/24 1700  vancomycin  (VANCOREADY) IVPB 1000 mg/200 mL  Status:  Discontinued        1,000 mg 200 mL/hr over 60 Minutes Intravenous Every 24 hours 03/03/24 1738 03/05/24 0813   03/03/24 1730  vancomycin  (VANCOREADY) IVPB 2000 mg/400 mL        2,000 mg 200 mL/hr over 120 Minutes Intravenous  Once 03/03/24 1729 03/04/24 0534   03/03/24 1645  vancomycin  (VANCOCIN ) IVPB 1000 mg/200 mL premix  Status:  Discontinued        1,000 mg 200 mL/hr over 60 Minutes Intravenous  Once 03/03/24 1635 03/03/24 1729       Data Reviewed: I have personally reviewed following labs and imaging studies CBC: Recent Labs  Lab 03/03/24 1124 03/03/24 1452 03/04/24 0548 03/05/24 0432 03/06/24 0950  WBC 12.2* 15.0* 15.1* 12.0* 12.7*  NEUTROABS 8.6*  10.8*  --   --   --   HGB 11.9* 12.5 10.3* 10.0* 9.5*  HCT 38.2 40.7 32.8* 32.2* 30.4*  MCV 89.3 93.8 91.6 92.0 91.0  PLT 388.0 421* 366 346 339   Basic Metabolic Panel: Recent Labs  Lab 03/03/24 1111 03/03/24 1452 03/04/24 0548 03/05/24 0432 03/06/24 0950  NA 140 138 141 137 134*  K 3.9 3.8 4.1 4.0 4.0  CL 103 105 109 106 105  CO2 25 22 23 23  20*  GLUCOSE 192* 271* 132* 94 154*  BUN 10 11 16 21 21   CREATININE 1.01 1.08* 1.17* 1.22* 1.21*  CALCIUM  8.4 8.6* 8.2* 7.9* 8.4*   GFR: Estimated Creatinine Clearance: 46.5 mL/min (A) (by C-G formula based on SCr of 1.21 mg/dL (H)). Liver Function Tests: Recent Labs  Lab 03/03/24 1452  AST 18  ALT 14  ALKPHOS 165*  BILITOT 0.7  PROT 6.9  ALBUMIN  2.9*   CBG: Recent Labs  Lab 03/06/24 2012 03/07/24 0731 03/07/24 0942 03/07/24 1117 03/07/24 1623  GLUCAP 166* 107* 119* 143* 203*    Recent Results (from the  past 240 hours)  Blood culture (routine x 2)     Status: Abnormal   Collection Time: 03/03/24  5:01 PM   Specimen: BLOOD RIGHT FOREARM  Result Value Ref Range Status   Specimen Description   Final    BLOOD RIGHT FOREARM Performed at [facility], [address], [place], [place] [address]    Special Requests   Final    BOTTLES DRAWN AEROBIC AND ANAEROBIC Blood Culture results may not be optimal due to an inadequate volume of blood received in culture bottles Performed at [facility], [address] [place], [place] [address]    Culture  Setup Time   Final    GRAM POSITIVE COCCI ANAEROBIC BOTTLE ONLY CRITICAL RESULT CALLED TO, READ BACK BY AND VERIFIED WITH: TIFFANY GILCHRIST, PHARMD AT 1010 ON 03/04/24 GM    Culture (A)  Final    STAPHYLOCOCCUS HOMINIS THE SIGNIFICANCE OF ISOLATING THIS ORGANISM FROM A SINGLE SET OF BLOOD CULTURES WHEN MULTIPLE SETS ARE DRAWN IS UNCERTAIN. PLEASE NOTIFY THE MICROBIOLOGY DEPARTMENT WITHIN ONE WEEK IF SPECIATION AND SENSITIVITIES ARE REQUIRED. Performed at [facility], [address] [place], [place] [address]    Report Status 03/06/2024 FINAL  Final  Blood Culture ID Panel (Reflexed)     Status: Abnormal   Collection Time: 03/03/24  5:01 PM  Result Value Ref Range Status   Enterococcus faecalis NOT DETECTED NOT DETECTED Final   Enterococcus Faecium NOT DETECTED NOT DETECTED Final   Listeria monocytogenes NOT DETECTED NOT DETECTED Final   Staphylococcus species DETECTED (A) NOT DETECTED Final    Comment: CRITICAL RESULT CALLED TO, READ BACK BY AND VERIFIED WITH: TIFFANY GILCHRIST, PHARMD AT 1010 ON 03/04/24 GM    Staphylococcus aureus (BCID) NOT DETECTED NOT DETECTED Final   Staphylococcus epidermidis NOT DETECTED NOT DETECTED Final   Staphylococcus lugdunensis NOT DETECTED NOT DETECTED Final   Streptococcus species NOT DETECTED NOT DETECTED Final   Streptococcus agalactiae NOT DETECTED NOT DETECTED Final   Streptococcus  pneumoniae NOT DETECTED NOT DETECTED Final   Streptococcus pyogenes NOT DETECTED NOT DETECTED Final   A.calcoaceticus-baumannii NOT DETECTED NOT DETECTED Final   Bacteroides fragilis NOT DETECTED NOT DETECTED Final   Enterobacterales NOT DETECTED NOT DETECTED Final   Enterobacter cloacae complex NOT DETECTED NOT DETECTED Final   Escherichia coli NOT DETECTED NOT DETECTED Final   Klebsiella  aerogenes NOT DETECTED NOT DETECTED Final   Klebsiella oxytoca NOT DETECTED NOT DETECTED Final   Klebsiella pneumoniae NOT DETECTED NOT DETECTED Final   Proteus species NOT DETECTED NOT DETECTED Final   Salmonella species NOT DETECTED NOT DETECTED Final   Serratia marcescens NOT DETECTED NOT DETECTED Final   Haemophilus influenzae NOT DETECTED NOT DETECTED Final   Neisseria meningitidis NOT DETECTED NOT DETECTED Final   Pseudomonas aeruginosa NOT DETECTED NOT DETECTED Final   Stenotrophomonas maltophilia NOT DETECTED NOT DETECTED Final   Candida albicans NOT DETECTED NOT DETECTED Final   Candida auris NOT DETECTED NOT DETECTED Final   Candida glabrata NOT DETECTED NOT DETECTED Final   Candida krusei NOT DETECTED NOT DETECTED Final   Candida parapsilosis NOT DETECTED NOT DETECTED Final   Candida tropicalis NOT DETECTED NOT DETECTED Final   Cryptococcus neoformans/gattii NOT DETECTED NOT DETECTED Final    Comment: Performed at [facility], [address] [place], [place] [address]  Blood culture (routine x 2)     Status: None (Preliminary result)   Collection Time: 03/03/24  7:01 PM   Specimen: BLOOD  Result Value Ref Range Status   Specimen Description BLOOD BLOOD RIGHT HAND  Final   Special Requests   Final    BOTTLES DRAWN AEROBIC AND ANAEROBIC Blood Culture results may not be optimal due to an inadequate volume of blood received in culture bottles   Culture   Final    NO GROWTH 4 DAYS Performed at [facility], [address] [place], [place] [address]    Report Status  PENDING  Incomplete     Radiology Studies: No results found.   Scheduled Meds:  allopurinol   100 mg Oral Daily   atorvastatin   80 mg Oral QHS   dapagliflozin  propanediol  10 mg Oral Daily   docusate sodium   100 mg Oral Daily   DULoxetine   60 mg Oral Daily   ezetimibe   10 mg Oral Daily   Finerenone   10 mg Oral q AM   fluticasone  furoate-vilanterol  1 puff Inhalation Daily   insulin  aspart  0-15 Units Subcutaneous TID WC   insulin  aspart  0-5 Units Subcutaneous QHS   insulin  glargine  12 Units Subcutaneous Q2200   pantoprazole   40 mg Oral Daily   pregabalin   50 mg Oral BID   tiZANidine   4 mg Oral BID   torsemide   20 mg Oral Daily   Continuous Infusions:  ceFEPime  (MAXIPIME ) IV 2 g (03/07/24 0535)   vancomycin  1,000 mg (03/06/24 1819)     LOS: 4 days  MDM: Denise Sharp is high risk for one or more organ failure.  They necessitate ongoing hospitalization for continued IV therapies and subsequent lab monitoring. Total time spent interpreting labs and vitals, reviewing the medical record, coordinating care amongst consultants and care team members, directly assessing and discussing care with the Denise Sharp and/or family: 55 min  Laree Lock, MD [facility]  To contact the attending physician between 7A-7P please use Epic Chat. To contact the covering physician during after hours 7P-7A, please review Amion.  03/07/2024, 5:01 PM   *This document has been created with the assistance of dictation software. Please excuse typographical errors. *

## 2024-03-07 NOTE — Plan of Care (Signed)
  Problem: Coping: Goal: Ability to adjust to condition or change in health will improve Outcome: Progressing   Problem: Fluid Volume: Goal: Ability to maintain a balanced intake and output will improve Outcome: Progressing   Problem: Metabolic: Goal: Ability to maintain appropriate glucose levels will improve Outcome: Progressing   Problem: Skin Integrity: Goal: Risk for impaired skin integrity will decrease Outcome: Progressing   Problem: Tissue Perfusion: Goal: Adequacy of tissue perfusion will improve Outcome: Progressing

## 2024-03-07 NOTE — Anesthesia Procedure Notes (Signed)
 Procedure Name: Intubation Date/Time: 03/07/2024 7:59 AM  Performed by: Delores Evalene BROCKS, CRNAPre-anesthesia Checklist: Patient identified, Patient being monitored, Timeout performed, Emergency Drugs available and Suction available Patient Re-evaluated:Patient Re-evaluated prior to induction Oxygen  Delivery Method: Circle system utilized Preoxygenation: Pre-oxygenation with 100% oxygen  Induction Type: IV induction Ventilation: Mask ventilation without difficulty Laryngoscope Size: 3 and McGrath Grade View: Grade II Tube type: Oral Tube size: 7.0 mm Number of attempts: 1 Airway Equipment and Method: Stylet and Video-laryngoscopy Placement Confirmation: ETT inserted through vocal cords under direct vision, positive ETCO2 and breath sounds checked- equal and bilateral Secured at: 21 cm Tube secured with: Tape Dental Injury: Teeth and Oropharynx as per pre-operative assessment

## 2024-03-07 NOTE — Progress Notes (Signed)
 Pt has returned to the unit.

## 2024-03-07 NOTE — Transfer of Care (Signed)
 Immediate Anesthesia Transfer of Care Note  Patient: Denise Sharp  Procedure(s) Performed: AMPUTATION BELOW KNEE (Left: Leg Lower)  Patient Location: PACU  Anesthesia Type:General  Level of Consciousness: drowsy  Airway & Oxygen  Therapy: Patient Spontanous Breathing and Patient connected to face mask oxygen   Post-op Assessment: Report given to RN and Post -op Vital signs reviewed and stable  Post vital signs: Reviewed and stable  Last Vitals:  Vitals Value Taken Time  BP 105/68 03/07/24 09:43  Temp    Pulse 69 03/07/24 09:44  Resp 15 03/07/24 09:44  SpO2 100 % 03/07/24 09:44  Vitals shown include unfiled device data.  Last Pain:  Vitals:   03/06/24 2309  TempSrc:   PainSc: Asleep         Complications: No notable events documented.

## 2024-03-07 NOTE — Anesthesia Preprocedure Evaluation (Signed)
 Anesthesia Evaluation  Patient identified by MRN, date of birth, ID band Patient awake    History of Anesthesia Complications Negative for: history of anesthetic complications  Airway Mallampati: II       Dental  (+) Upper Dentures, Lower Dentures, Dental Advidsory Given   Pulmonary shortness of breath and with exertion, asthma , sleep apnea and Continuous Positive Airway Pressure Ventilation , neg COPD, neg recent URI    + decreased breath sounds      Cardiovascular Exercise Tolerance: Poor hypertension, Pt. on medications + angina (stable) with exertion + CAD and + DOE  (-) Past MI and (-) Cardiac Stents + dysrhythmias Atrial Fibrillation (-) Valvular Problems/Murmurs Rhythm:Irregular  ECHO 03/30/23: 1. Left ventricular ejection fraction, by estimation, is 60 to 65%. The left ventricle has normal function. The left ventricle has no regional wall motion abnormalities. There is mild left ventricular hypertrophy. Left ventricular diastolic parameters are consistent with Grade II diastolic dysfunction (pseudonormalization).   2. Right ventricular systolic function is normal. The right ventricular size is normal.   3. Left atrial size was mild to moderately dilated.   4. Right atrial size was mildly dilated.   5. The mitral valve is degenerative. No evidence of mitral valve regurgitation.   6. The aortic valve was not well visualized. Aortic valve regurgitation is mild.     Neuro/Psych  Headaches PSYCHIATRIC DISORDERS Anxiety Depression     Neuromuscular disease    GI/Hepatic Neg liver ROS,GERD  ,,  Endo/Other  diabetes, Poorly Controlled, Type 1  Class 3 obesity  Renal/GU CRFRenal disease  negative genitourinary   Musculoskeletal   Abdominal  (+) + obese  Peds negative pediatric ROS (+)  Hematology  (+) Blood dyscrasia, anemia   Anesthesia Other Findings Past Medical History: No date: (HFpEF) heart failure with preserved  ejection fraction (HCC)     Comment:  a. 12/2019 Echo: EF 65-70%, Gr2 DD. No significant               valvular dzs. 03/25/2013: Abnormality of gait No date: Adrenal mass, left (HCC) No date: Anginal pain (HCC) No date: Anxiety No date: Aortic atherosclerosis (HCC) No date: Arthritis No date: Asthma No date: CAD (coronary artery disease) with h/o Atypical Chest Pain     Comment:  a. 04/1986 Cath ([facility]): nl cors, EF 65%; b. 07/2012 Cath               [facility]): Diff minor irregs; c. 07/2016 MV [facility]):               Equivocal; d. 08/2016 Cardiac CT Ca2+ score [facility]): Ca2+              1548; e. 05/2019 MV: No ischemia. EF 75%; f. 12/2019 PCI:               LM nl, LAD 65ost (3.5x12 Resolute DES), 63m (2.75x12               Resolute DES),LCX/RCA nl; g. 08/2020 Cath: patent LAD               stents, RCA 40ost, elev RH pressures, EF >65%. 1994: Cervical spinal stenosis     Comment:  due to trauma to back ([employer] accident), has               intermittent paralysis and parasthesias 03/23/2014: Cervicogenic headache No date: CKD (chronic kidney disease), stage III (HCC) No date: Depression No date: Diverticulosis No date: Dizziness  Comment:  a.) chronic No date: DJD (degenerative joint disease)     Comment:  a. Chronic R shoulder pain No date: Dyspnea 09/2009: Esophageal stenosis     Comment:  a.) transient outlet obstruction by food, cleared by EGD No date: Family history of adverse reaction to anesthesia     Comment:  a.) daughter with (+) PONV No date: Gastric bypass status for obesity No date: GERD (gastroesophageal reflux disease) No date: Headache(784.0) No date: HLD (hyperlipidemia) No date: Hypertension No date: IBS (irritable bowel syndrome) No date: IDA (iron  deficiency anemia)     Comment:  a.) post 2 unit txfsn 2009, normal endo/colonoscopy by               Jinny No date: Left bundle branch block (LBBB)     Comment:  a.) Intermittently present - likely rate  related. 11/15/2020: NSVT (nonsustained ventricular tachycardia)     Comment:  a.) Holter study 11/15/2020; 11 episodes of NSVT lasting              up to 8 beats with a maximum rate of 210 bpm; 14 atrial               runs lasting up to 18 beats with a rate of up to 250 bpm.              Some atrial runs felt to be NSVT. No date: Obesity No date: OSA on CPAP 03/25/2013: Polyneuropathy in diabetes(357.2) 03/09/2021: Postherpetic neuralgia     Comment:  Right V1 distribution No date: Restless leg syndrome 08/13/2017: Rotator cuff arthropathy, right 03/12/2014: Syncope and collapse No date: Type II diabetes mellitus (HCC)   Reproductive/Obstetrics negative OB ROS                              Anesthesia Physical Anesthesia Plan  ASA: 4  Anesthesia Plan: General   Post-op Pain Management: Minimal or no pain anticipated   Induction: Intravenous  PONV Risk Score and Plan: 3 and Ondansetron , Dexamethasone  and Treatment may vary due to age or medical condition  Airway Management Planned: Oral ETT and LMA  Additional Equipment:   Intra-op Plan:   Post-operative Plan: Extubation in OR  Informed Consent: I have reviewed the patients History and Physical, chart, labs and discussed the procedure including the risks, benefits and alternatives for the proposed anesthesia with the patient or authorized representative who has indicated his/her understanding and acceptance.       Plan Discussed with: CRNA and Surgeon  Anesthesia Plan Comments:          Anesthesia Quick Evaluation

## 2024-03-07 NOTE — H&P (Signed)
 [facility] History & Physical Update  The patient was interviewed and re-examined.  The patient's previous History and Physical has been reviewed and is unchanged.  There is no change in the plan of care. We plan to proceed with the scheduled procedure. LEFT below knee amputation.  Tisa Curry LABOR, MD  03/07/2024, 7:39 AM

## 2024-03-08 ENCOUNTER — Encounter: Payer: Self-pay | Admitting: Vascular Surgery

## 2024-03-08 ENCOUNTER — Inpatient Hospital Stay

## 2024-03-08 DIAGNOSIS — L03116 Cellulitis of left lower limb: Secondary | ICD-10-CM | POA: Diagnosis not present

## 2024-03-08 LAB — CBC
HCT: 27.5 % — ABNORMAL LOW (ref 36.0–46.0)
Hemoglobin: 8.8 g/dL — ABNORMAL LOW (ref 12.0–15.0)
MCH: 28.9 pg (ref 26.0–34.0)
MCHC: 32 g/dL (ref 30.0–36.0)
MCV: 90.5 fL (ref 80.0–100.0)
Platelets: 347 K/uL (ref 150–400)
RBC: 3.04 MIL/uL — ABNORMAL LOW (ref 3.87–5.11)
RDW: 15.7 % — ABNORMAL HIGH (ref 11.5–15.5)
WBC: 13.8 K/uL — ABNORMAL HIGH (ref 4.0–10.5)
nRBC: 0 % (ref 0.0–0.2)

## 2024-03-08 LAB — CULTURE, BLOOD (ROUTINE X 2): Culture: NO GROWTH

## 2024-03-08 LAB — BASIC METABOLIC PANEL WITH GFR
Anion gap: 9 (ref 5–15)
BUN: 29 mg/dL — ABNORMAL HIGH (ref 8–23)
CO2: 24 mmol/L (ref 22–32)
Calcium: 8.2 mg/dL — ABNORMAL LOW (ref 8.9–10.3)
Chloride: 104 mmol/L (ref 98–111)
Creatinine, Ser: 1.57 mg/dL — ABNORMAL HIGH (ref 0.44–1.00)
GFR, Estimated: 34 mL/min — ABNORMAL LOW (ref >60)
Glucose, Bld: 100 mg/dL — ABNORMAL HIGH (ref 70–99)
Potassium: 4.2 mmol/L (ref 3.5–5.1)
Sodium: 137 mmol/L (ref 135–145)

## 2024-03-08 LAB — GLUCOSE, CAPILLARY
Glucose-Capillary: 83 mg/dL (ref 70–99)
Glucose-Capillary: 239 mg/dL — ABNORMAL HIGH (ref 70–99)
Glucose-Capillary: 60 mg/dL — ABNORMAL LOW (ref 70–99)
Glucose-Capillary: 89 mg/dL (ref 70–99)
Glucose-Capillary: 97 mg/dL (ref 70–99)

## 2024-03-08 MED ORDER — HYDROXYZINE HCL 10 MG PO TABS: 10.0000 mg | ORAL_TABLET | Freq: Three times a day (TID) | ORAL | Status: DC | PRN

## 2024-03-08 MED ORDER — CEPHALEXIN 500 MG PO CAPS
500.0000 mg | ORAL_CAPSULE | Freq: Four times a day (QID) | ORAL | Status: DC
  Administered 2024-03-08 – 2024-03-10 (×8): 500 mg via ORAL
  Filled 2024-03-08 (×8): qty 1

## 2024-03-08 MED ORDER — FINERENONE 10 MG PO TABS
10.0000 mg | ORAL_TABLET | Freq: Every morning | ORAL | Status: DC
  Administered 2024-03-09 – 2024-03-16 (×7): 10 mg via ORAL
  Filled 2024-03-08 (×8): qty 1

## 2024-03-08 NOTE — Progress Notes (Addendum)
 PROGRESS NOTE    Denise Sharp  FMW:[id number] DOB: [birth year] DOA: 03/03/2024 PCP: Marylynn Verneita CROME, MD  Chief Complaint  Denise Sharp presents with   Wound Infection    Hospital Course:  Denise Sharp is a 78 year old with hypertension, hyperlipidemia, CAD, anxiety, asthma, CKD, who presents from urology for chief concern of worsening wounds of the left foot, reports low grade fever at home, chills and increasing pain with discharge of her left foot. Progressive charcoaled arthropathy vs superimposed OM as per XR. MRI with worsening Charot joint and possible OM, Podiatry recommend Left BKA which Denise Sharp underwent on 08/23 by Vascular Surgery Hospital course as below   Subjective: Denise Sharp was examined at the bedside, underwent Left BKA yday, Denise Sharp present at the bedside at noon.  Denise Sharp confused, redirectable though, no evidence of neurodeficits     Objective: Vitals:   03/07/24 1927 03/07/24 2327 03/08/24 0327 03/08/24 0727  BP: 113/60 (!) 102/53 (!) 85/39 (!) 94/56  Pulse: 66 98 93 84  Resp:    20  Temp: 98.5 F (36.9 C) 99.2 F (37.3 C) (!) 97.5 F (36.4 C) 98.7 F (37.1 C)  TempSrc: Oral Axillary Axillary Oral  SpO2: 91% 90% 100% 93%  Weight:      Height:        Intake/Output Summary (Last 24 hours) at 03/08/2024 1345 Last data filed at 03/08/2024 0327 Gross per 24 hour  Intake 780 ml  Output 700 ml  Net 80 ml   Filed Weights   03/03/24 1826  Weight: 110.3 kg    Examination: Constitutional: appears age-appropriate, NAD, confused Neck: normal, supple, no masses, no thyromegaly Respiratory: clear to auscultation bilaterally, no wheezing, no crackles. Normal respiratory effort. No accessory muscle use.  Cardiovascular: Regular rate and rhythm, no murmurs / rubs / gallops. No extremity edema. 2+ pedal pulses. No carotid bruits.  Abdomen: Morbidly obese abdomen, no tenderness, no masses palpated, no hepatosplenomegaly. Bowel sounds positive.   Musculoskeletal: no clubbing / cyanosis. L BKA surgical dressing Skin: + left lateral foot/ankle lesion with increased erythema and edema Neurologic: Sensation intact. 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 Met SIRS criteria on adm (fever, leukocytosis, lactic acidosis) - Bcx 1/4: GPC (Staph species), likely contaminant - discussed with ID curbside - XR Left foot advanced osseous destructive changes and fragmentation of the midfoot, consistent with Charcot arthropathy.  Suspect progressive erosive change and osseous destruction of calcaneus, talus, cuboid navicular and cuneiform bones.  Increased effusion and soft tissue swelling.  Progressive charcoaled arthropathy vs superimposed OM -  MRI Left foot shows severe Charcot changes, findings concerning for early OM vs reactive changes throughout talus. No evidence of abscess - Seen by Podiatry, recommend Left BKA - Seen by Vascular surgery, s/p Left BKA 08/23, pathology sent - Eliquis  held - Discontinue IV Vancomycin , on IV Cefepime   changed to po Cephalexin   LUE cellulitis - improving - On abx as above  Acute toxic/metabolic encephalopathy on 08/24 - Suspect multifactorial due to medication (opioids, hydroxyzine , zanaflex ), hospital acquired deirium - No deficits on exam, Hx TIA as per Denise, CT Head pending - Discontinue Zanaflex , Hydroxyzine , Opioids. Continue Lyrica , Cymbalta  - Monitor mental status  Left knee pain s/p Total left knee arthroplasty - resolved - XR No acute fracture or dislocation - Pain management  Hypotension Hx HTN Reports having BP discrepancy, higher in LE > UE chronically low SBP in 90's S/p IV Albumin , avoid IV fluids due to hx CHF Monitor BP   Persistent atrial fibrillation (HCC) - Eliquis  held for Left BKA  Normocytic anemia Iron  deficiency anemia Denise Sharp reports that she gets iron  infusion Hb slowly down trending, 8.8 Outpatient follow-up as above.  NICM with preserved ejection fraction - On torsemide  daily - held   Type II diabetes mellitus with neurological manifestations (HCC) Home long acting insulin , hold Lantus  25u - poor po intake due to AMS Insulin  SSI with HS coverage Goal inpatient blood glucose level 140-180   Chronic anxiety Home duloxetine  60 mg daily resumed   Hyperlipidemia Home atorvastatin  80 mg nightly resumed   Major depressive disorder with current active episode Home duloxetine  60 mg daily resumed  AKI on CKD stage 3a Monitor Cr, baseline appears 1.1-1.3 Cr inc 1.57, Hold torsemide  Monitor Cr   Morbid obesity This complicates overall care and prognosis.    History of gout Outpatient 01/24/2024 Rheumatology note reviewed: Allopurinol  100 mg daily resumed, continue colchicine  as needed for inflammation Allopurinol  100 mg daily resume; discontinue scheduled colchicine    GAD (generalized anxiety disorder) PDMP reviewed  Obesity hypoventilation syndrome (HCC) Cpap at bedtime ordered   Weakness Fall precaution PT/OT rec SNF when  ready  DVT prophylaxis: Eliquis    Code Status: Full Code Disposition:  TBD  Consultants:  Podiatry Vascular Surgery  Procedures:  Left BKA  Antimicrobials:  Anti-infectives (From admission, onward)    Start     Dose/Rate Route Frequency Ordered Stop   03/06/24 0500  ceFEPIme  (MAXIPIME ) 2 g in sodium chloride  0.9 % 100 mL IVPB        2 g 200 mL/hr over 30 Minutes Intravenous Every 12 hours 03/05/24 2309     03/05/24 1700  vancomycin  (VANCOCIN ) IVPB 1000 mg/200 mL premix  Status:  Discontinued        1,000 mg 200 mL/hr over 60 Minutes Intravenous Every 24 hours 03/05/24 0813 03/08/24 0828   03/05/24 1200  ceFEPIme  (MAXIPIME ) 2 g in sodium chloride  0.9 % 100 mL IVPB  Status:  Discontinued        2 g 200 mL/hr over 30 Minutes Intravenous 2 times daily 03/05/24 1128 03/05/24 2309   03/04/24 1700  vancomycin  (VANCOREADY) IVPB 1000 mg/200 mL  Status:  Discontinued        1,000 mg 200 mL/hr over 60 Minutes Intravenous Every 24 hours 03/03/24 1738 03/05/24 0813   03/03/24 1730  vancomycin  (VANCOREADY) IVPB 2000 mg/400 mL        2,000 mg 200 mL/hr over 120 Minutes Intravenous  Once 03/03/24 1729 03/04/24 0534   03/03/24 1645  vancomycin  (VANCOCIN ) IVPB  1000 mg/200 mL premix  Status:  Discontinued        1,000 mg 200 mL/hr over 60 Minutes Intravenous  Once 03/03/24 1635 03/03/24 1729       Data Reviewed: I have personally reviewed following labs and imaging studies CBC: Recent Labs  Lab 03/03/24 1124 03/03/24 1452 03/04/24 0548 03/05/24 0432 03/06/24 0950 03/08/24 0549  WBC 12.2* 15.0* 15.1* 12.0* 12.7* 13.8*  NEUTROABS 8.6* 10.8*  --   --   --   --   HGB 11.9* 12.5 10.3* 10.0* 9.5* 8.8*  HCT 38.2 40.7 32.8* 32.2* 30.4* 27.5*  MCV 89.3 93.8 91.6 92.0 91.0 90.5  PLT 388.0 421* 366 346 339 347   Basic Metabolic Panel: Recent Labs  Lab 03/03/24 1452 03/04/24 0548 03/05/24 0432 03/06/24 0950 03/08/24 0549  NA 138 141 137 134* 137  K 3.8 4.1 4.0 4.0 4.2  CL 105 109  106 105 104  CO2 22 23 23  20* 24  GLUCOSE 271* 132* 94 154* 100*  BUN 11 16 21 21  29*  CREATININE 1.08* 1.17* 1.22* 1.21* 1.57*  CALCIUM  8.6* 8.2* 7.9* 8.4* 8.2*   GFR: Estimated Creatinine Clearance: 35.9 mL/min (A) (by C-G formula based on SCr of 1.57 mg/dL (H)). Liver Function Tests: Recent Labs  Lab 03/03/24 1452  AST 18  ALT 14  ALKPHOS 165*  BILITOT 0.7  PROT 6.9  ALBUMIN  2.9*   CBG: Recent Labs  Lab 03/07/24 1117 03/07/24 1623 03/07/24 2121 03/08/24 0741 03/08/24 1216  GLUCAP 143* 203* 147* 83 239*    Recent Results (from the past 240 hours)  Blood culture (routine x 2)     Status: Abnormal   Collection Time: 03/03/24  5:01 PM   Specimen: BLOOD RIGHT FOREARM  Result Value Ref Range Status   Specimen Description   Final    BLOOD RIGHT FOREARM Performed at [facility], [address] [place], [place] [address]    Special Requests   Final    BOTTLES DRAWN AEROBIC AND ANAEROBIC Blood Culture results may not be optimal due to an inadequate volume of blood received in culture bottles Performed at [facility], [address] [place], [place] [address]    Culture  Setup Time   Final    GRAM POSITIVE COCCI ANAEROBIC BOTTLE ONLY CRITICAL RESULT CALLED TO, READ BACK BY AND VERIFIED WITH: TIFFANY GILCHRIST, PHARMD AT 1010 ON 03/04/24 GM    Culture (A)  Final    STAPHYLOCOCCUS HOMINIS THE SIGNIFICANCE OF ISOLATING THIS ORGANISM FROM A SINGLE SET OF BLOOD CULTURES WHEN MULTIPLE SETS ARE DRAWN IS UNCERTAIN. PLEASE NOTIFY THE MICROBIOLOGY DEPARTMENT WITHIN ONE WEEK IF SPECIATION AND SENSITIVITIES ARE REQUIRED. Performed at [facility], [address], [place], [place] [address]    Report Status 03/06/2024 FINAL  Final  Blood Culture ID Panel (Reflexed)     Status: Abnormal   Collection Time: 03/03/24  5:01 PM  Result Value Ref Range Status   Enterococcus faecalis NOT DETECTED NOT DETECTED Final   Enterococcus Faecium NOT DETECTED NOT  DETECTED Final   Listeria monocytogenes NOT DETECTED NOT DETECTED Final   Staphylococcus species DETECTED (A) NOT DETECTED Final    Comment: CRITICAL RESULT CALLED TO, READ BACK BY AND VERIFIED WITH: TIFFANY GILCHRIST, PHARMD AT 1010 ON 03/04/24 GM    Staphylococcus aureus (BCID) NOT DETECTED NOT DETECTED Final   Staphylococcus epidermidis NOT DETECTED NOT DETECTED Final   Staphylococcus lugdunensis NOT DETECTED NOT DETECTED Final  Streptococcus species NOT DETECTED NOT DETECTED Final   Streptococcus agalactiae NOT DETECTED NOT DETECTED Final   Streptococcus pneumoniae NOT DETECTED NOT DETECTED Final   Streptococcus pyogenes NOT DETECTED NOT DETECTED Final   A.calcoaceticus-baumannii NOT DETECTED NOT DETECTED Final   Bacteroides fragilis NOT DETECTED NOT DETECTED Final   Enterobacterales NOT DETECTED NOT DETECTED Final   Enterobacter cloacae complex NOT DETECTED NOT DETECTED Final   Escherichia coli NOT DETECTED NOT DETECTED Final   Klebsiella aerogenes NOT DETECTED NOT DETECTED Final   Klebsiella oxytoca NOT DETECTED NOT DETECTED Final   Klebsiella pneumoniae NOT DETECTED NOT DETECTED Final   Proteus species NOT DETECTED NOT DETECTED Final   Salmonella species NOT DETECTED NOT DETECTED Final   Serratia marcescens NOT DETECTED NOT DETECTED Final   Haemophilus influenzae NOT DETECTED NOT DETECTED Final   Neisseria meningitidis NOT DETECTED NOT DETECTED Final   Pseudomonas aeruginosa NOT DETECTED NOT DETECTED Final   Stenotrophomonas maltophilia NOT DETECTED NOT DETECTED Final   Candida albicans NOT DETECTED NOT DETECTED Final   Candida auris NOT DETECTED NOT DETECTED Final   Candida glabrata NOT DETECTED NOT DETECTED Final   Candida krusei NOT DETECTED NOT DETECTED Final   Candida parapsilosis NOT DETECTED NOT DETECTED Final   Candida tropicalis NOT DETECTED NOT DETECTED Final   Cryptococcus neoformans/gattii NOT DETECTED NOT DETECTED Final    Comment: Performed at [facility], [address] [place], [place] [address]  Blood culture (routine x 2)     Status: None   Collection Time: 03/03/24  7:01 PM   Specimen: BLOOD  Result Value Ref Range Status   Specimen Description BLOOD BLOOD RIGHT HAND  Final   Special Requests   Final    BOTTLES DRAWN AEROBIC AND ANAEROBIC Blood Culture results may not be optimal due to an inadequate volume of blood received in culture bottles   Culture   Final    NO GROWTH 5 DAYS Performed at [facility], [address], [place], [place] [address]    Report Status 03/08/2024 FINAL  Final     Radiology Studies: No results found.   Scheduled Meds:  allopurinol   100 mg Oral Daily   atorvastatin   80 mg Oral QHS   dapagliflozin  propanediol  10 mg Oral Daily   docusate sodium   100 mg Oral Daily   DULoxetine   60 mg Oral Daily   ezetimibe   10 mg Oral Daily   Finerenone   10 mg Oral q AM   fluticasone  furoate-vilanterol  1 puff Inhalation Daily   insulin  aspart  0-15 Units Subcutaneous TID WC   insulin  aspart  0-5 Units Subcutaneous QHS   insulin  glargine  25 Units Subcutaneous Q2200   pantoprazole   40 mg Oral Daily   pregabalin   50 mg Oral BID   Continuous Infusions:  ceFEPime  (MAXIPIME ) IV 2 g (03/08/24 0504)     LOS: 5 days  MDM: Denise Sharp is high risk for one or more organ failure.  They necessitate ongoing hospitalization for continued IV therapies and subsequent lab monitoring. Total time spent interpreting labs and vitals, reviewing the medical record, coordinating care amongst consultants and care team members, directly assessing and discussing care with the Denise Sharp and/or family: 55 min  Laree Lock, MD [facility]  To contact the attending physician between 7A-7P please use Epic Chat. To contact the covering physician during after hours 7P-7A, please review Amion.  03/08/2024, 1:45 PM   *This document has been created with the assistance of dictation  software. Please excuse typographical  errors. *

## 2024-03-08 NOTE — Evaluation (Signed)
 Physical Therapy Re -Evaluation Patient Details Name: Denise Sharp MRN: [id number] DOB: 1945/10/31 Today's Date: 03/08/2024  History of Present Illness  Pt is a 78 year old with hypertension, hyperlipidemia, CAD, anxiety, asthma, CKD, who presents from urology for chief concern of worsening wounds of the left foot.  Dx: Cellulitis of left foot, SIRS (systemic inflammatory response syndrome), Persistent atrial fibrillation. 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  Clinical Impression  Patient re-evaluated this date due to change in medical status when patient underwent L transtibial ambulation yesterday. Patient agitated upon PT arrival but was able to calm down with help organizing her thoughts and addressing her concerns so that she was agreeable to PT. Daughter Bari arrived while PT was getting water  for patient prior to mobility to help with comfort. Patient significantly more confused this date compared to prior PT sessions and noted by Bari, and MD. Nursing notified by PT of this and agreed. Prior to her first hospitalization a few weeks ago patient lived with her husband in a 1 story home with ramped entrance. She has used a RW for about 2 years for mobility within the home and uses a scooter to go to [employer] appointments and at the grocery store. The doors in her home are too narrow for the scooter or RW to fit through. She must turn the RW sideways to get through doorways, resulting in her husband assisting her from bedroom to bathroom while seated/rolling on an office chair. After her first hospitalization (about 1 week) she spent 20 days in short term rehab at a SNF before discharging home due to improvement in her function. After about at week at home she was re-hospitalized (this hospitalization). After leaving SNF she was walking a few feet with the RW at home and needed help with ADLs and IADLs. Upon evaluation today, patient had difficulty following commands (single  commands inconsistently) and fluxuated drastically in her awareness of her limitations, for example stating I can walk with the walker if I just get scooted closer to the edge of the bed. She needed mod A for supine <> sit with use of bed rails, step by step cuing, and head of bed elevated. She needed total A +2 for boost up bed. She attempted sit <> stand at edge of bed with PT but was unable to tolerate weight bearing through L UE on RW and was only able to offload buttocks partially with max A sit <> stand with L knee blocked and support at ischial tuberosities. Patient's mental state made it unsafe to attempt further mobility without +2 assistance. Also recommend considering use of slide board next session. Patient returned to bed with needs within reach and nursing and daughter at bedside. Patient demonstrates further decline in functional independence and mobility and has continued need for high level of care and continued PT. Patient would benefit from skilled physical therapy to address impairments and functional limitations (see PT Problem List below) to work towards stated goals and return to PLOF or maximal functional independence.        If plan is discharge home, recommend the following: Assistance with cooking/housework;Assist for transportation;Help with stairs or ramp for entrance;Two people to help with walking and/or transfers;Two people to help with bathing/dressing/bathroom;Supervision due to cognitive status   Can travel by private vehicle   No    Equipment Recommendations Hospital bed;Hoyer lift  Recommendations for Other Services  OT consult    Functional Status Assessment Patient has had a recent decline in  their functional status and demonstrates the ability to make significant improvements in function in a reasonable and predictable amount of time.     Precautions / Restrictions Precautions Precautions: Fall      Mobility  Bed Mobility Overal bed mobility: Needs  Assistance Bed Mobility: Supine to Sit, Sit to Supine     Supine to sit: HOB elevated, Used rails, Mod assist Sit to supine: HOB elevated, Used rails, Mod assist   General bed mobility comments: Max A boost up bed +2    Transfers Overall transfer level: Needs assistance Equipment used: Rolling walker (2 wheels)   Sit to Stand: Max assist           General transfer comment: attempted sit <> stand at edge of bed with walker and with max support blocking L knee and at ischial tuberosities. Able to offload buttocks some but unable to fully stand up or pivot safely.    Ambulation/Gait               General Gait Details: unable attempt ambulation safely  Stairs            Wheelchair Mobility     Tilt Bed    Modified Rankin (Stroke Patients Only)       Balance Overall balance assessment: Needs assistance Sitting-balance support: Feet supported, Single extremity supported Sitting balance-Leahy Scale: Fair Sitting balance - Comments: steady sitting edge of bed   Standing balance support: During functional activity, Reliant on assistive device for balance, Bilateral upper extremity supported Standing balance-Leahy Scale: Zero Standing balance comment: unable to come to stand                             Pertinent Vitals/Pain Pain Assessment Pain Assessment: Faces Faces Pain Scale: Hurts a little bit Pain Location: L knee and phantom pain to her left foot Pain Descriptors / Indicators: Grimacing, Moaning Pain Intervention(s): Limited activity within patient's tolerance, Monitored during session, Repositioned    Home Living Family/patient expects to be discharged to:: Private residence Living Arrangements: Spouse/significant other Available Help at Discharge: Family;Available 24 hours/day Type of Home: House Home Access: Ramped entrance       Home Layout: One level Home Equipment: Agricultural consultant (2 wheels);Electric scooter;Shower seat;Cane  - single point;Rollator (4 wheels);Shower seat - built in;Grab bars - toilet;Grab bars - tub/shower;BSC/3in1 Additional Comments: Pt reports having a toileting aid.   Pt reports that she has attempted to get a power wheelchair.    Prior Function Prior Level of Function : Needs assist       Physical Assist : Mobility (physical) Mobility (physical): Gait   Mobility Comments: Pateint with increased confusion today. Daughter Bari present and assists with histry. Pateint has mobilized with a RW for years. She recently spent 20 days in SNF rehab after a 1 week hospital stay and was discharged home for about 1 week prior to returing to hospital. She reports the ability to ambulate short distances using RW in the home last week but she cannot get the RW through doorways in the home because they are too narrow. She has to move sideways through doorways in the home if she is going to bring her walker, so pateint and husband work together to use a rolling office chari to get from the bed to the bathroom. Prior to initial hospitalization she used a RW for mobility and scooter to get to doctor's appointments or at the grocery store. She  cannot use the scooter in the house (except the living room) because the doors in the home are too narrow. Daughter states pt struggles at baseline to use the scooter safely and does not expect her to be able to safely use it without both legs. ADLs Comments: Prior to this hospitalization she needed assistance with ADLs and IADLs. She has not been able to care for herself without assistance for a couple of years. Assistance is mostly provided by her husband who is in his 93s and has cancer.     Extremity/Trunk Assessment   Upper Extremity Assessment Upper Extremity Assessment: Generalized weakness LUE Deficits / Details: limited ability to take force through L UE due to pain and swelling. Holds it guarded at side.    Lower Extremity Assessment Lower Extremity Assessment:  Generalized weakness;LLE deficits/detail LLE Deficits / Details: NWB due to L transtibial ambulation, unable to support bodyweight on R LE only (but unaware of this)    Cervical / Trunk Assessment Cervical / Trunk Assessment: Normal  Communication   Communication Communication: Impaired Factors Affecting Communication: Reduced clarity of speech (mild but not at baseline per daughter)    Cognition Arousal: Alert Behavior During Therapy: Impulsive, Agitated   PT - Cognitive impairments: Awareness, Memory, Safety/Judgement, Problem solving, Sequencing, Attention                       PT - Cognition Comments: Patient quite confused today and switching topics causing upset. Daughter Bari present and states this is much worse since yesterday. MD also mentioned worse confusion to PT following her exam. Pateint frustrated and not aware of deficits. Following commands: Impaired Following commands impaired: Follows one step commands inconsistently     Cueing Cueing Techniques: Verbal cues, Gestural cues     General Comments General comments (skin integrity, edema, etc.): vitals stayed [facility] on room air    Exercises Other Exercises Other Exercises: patient assisted on role of PT in acute care setting, dischare reccomendations, re-orientation as appropriate.   Assessment/Plan    PT Assessment Patient needs continued PT services  PT Problem List Decreased strength;Decreased activity tolerance;Decreased balance;Decreased mobility;Decreased skin integrity;Decreased safety awareness;Decreased knowledge of precautions;Decreased cognition;Pain;Decreased knowledge of use of DME;Obesity       PT Treatment Interventions DME instruction;Gait training;Functional mobility training;Therapeutic activities;Therapeutic exercise;Balance training;Neuromuscular re-education;Patient/family education;Cognitive remediation    PT Goals (Current goals can be found in the Care Plan section)  Acute  Rehab PT Goals Patient Stated Goal: to get something to drink and her things organized so she can stay comfortable PT Goal Formulation: With patient Time For Goal Achievement: 03/22/24 Potential to Achieve Goals: Good    Frequency Min 2X/week     Co-evaluation               AM-PAC PT 6 Clicks Mobility  Outcome Measure Help needed turning from your back to your side while in a flat bed without using bedrails?: A Lot Help needed moving from lying on your back to sitting on the side of a flat bed without using bedrails?: A Lot Help needed moving to and from a bed to a chair (including a wheelchair)?: Total Help needed standing up from a chair using your arms (e.g., wheelchair or bedside chair)?: Total Help needed to walk in hospital room?: Total Help needed climbing 3-5 steps with a railing? : Total 6 Click Score: 8    End of Session Equipment Utilized During Treatment: Gait belt Activity Tolerance: Patient limited by pain;Other (  comment) (by confusion) Patient left: in bed;with call bell/phone within reach;with bed alarm set;with family/visitor present Nurse Communication: Mobility status;Other (comment) (increased confusion) PT Visit Diagnosis: Unsteadiness on feet (R26.81);Muscle weakness (generalized) (M62.81);Difficulty in walking, not elsewhere classified (R26.2);Pain Pain - Right/Left: Left Pain - part of body: Leg    Time: 1000-1054 PT Time Calculation (min) (ACUTE ONLY): 54 min   Charges:   PT Evaluation $PT Eval High Complexity: 1 High PT Treatments $Therapeutic Activity: 8-22 mins PT General Charges $$ ACUTE PT VISIT: 1 Visit         Camie R. Juli, PT, DPT, Cert. MDT 03/08/24, 11:28 AM  [facility] [address] [place], [place] [address] P: [phone] I F: [phone]

## 2024-03-08 NOTE — Progress Notes (Signed)
 1 Day Post-Op   Subjective/Chief Complaint: Some confusion this morning but easily redirected. Denies pain in LEFT BKA stump   Objective: Vital signs in last 24 hours: Temp:  [97.2 F (36.2 C)-99.2 F (37.3 C)] 98.7 F (37.1 C) (08/24 0727) Pulse Rate:  [66-98] 84 (08/24 0727) Resp:  [10-20] 20 (08/24 0727) BP: (85-123)/(39-69) 94/56 (08/24 0727) SpO2:  [90 %-100 %] 93 % (08/24 0727) Last BM Date : 03/05/24  Intake/Output from previous day: 08/23 0701 - 08/24 0700 In: 2000 [P.O.:800; I.V.:900; IV Piggyback:300] Out: 1800 [Urine:1700; Blood:100] Intake/Output this shift: No intake/output data recorded.  General appearance: alert and no distress Extremities: LEFT BKA- dressing- C/D/I, thigh soft  Lab Results:  Recent Labs    03/06/24 0950 03/08/24 0549  WBC 12.7* 13.8*  HGB 9.5* 8.8*  HCT 30.4* 27.5*  PLT 339 347   BMET Recent Labs    03/06/24 0950 03/08/24 0549  NA 134* 137  K 4.0 4.2  CL 105 104  CO2 20* 24  GLUCOSE 154* 100*  BUN 21 29*  CREATININE 1.21* 1.57*  CALCIUM  8.4* 8.2*   PT/INR No results for input(s): LABPROT, INR in the last 72 hours. ABG No results for input(s): PHART, HCO3 in the last 72 hours.  Invalid input(s): PCO2, PO2  Studies/Results: No results found.  Anti-infectives: Anti-infectives (From admission, onward)    Start     Dose/Rate Route Frequency Ordered Stop   03/06/24 0500  ceFEPIme  (MAXIPIME ) 2 g in sodium chloride  0.9 % 100 mL IVPB        2 g 200 mL/hr over 30 Minutes Intravenous Every 12 hours 03/05/24 2309     03/05/24 1700  vancomycin  (VANCOCIN ) IVPB 1000 mg/200 mL premix        1,000 mg 200 mL/hr over 60 Minutes Intravenous Every 24 hours 03/05/24 0813     03/05/24 1200  ceFEPIme  (MAXIPIME ) 2 g in sodium chloride  0.9 % 100 mL IVPB  Status:  Discontinued        2 g 200 mL/hr over 30 Minutes Intravenous 2 times daily 03/05/24 1128 03/05/24 2309   03/04/24 1700  vancomycin  (VANCOREADY) IVPB 1000 mg/200  mL  Status:  Discontinued        1,000 mg 200 mL/hr over 60 Minutes Intravenous Every 24 hours 03/03/24 1738 03/05/24 0813   03/03/24 1730  vancomycin  (VANCOREADY) IVPB 2000 mg/400 mL        2,000 mg 200 mL/hr over 120 Minutes Intravenous  Once 03/03/24 1729 03/04/24 0534   03/03/24 1645  vancomycin  (VANCOCIN ) IVPB 1000 mg/200 mL premix  Status:  Discontinued        1,000 mg 200 mL/hr over 60 Minutes Intravenous  Once 03/03/24 1635 03/03/24 1729       Assessment/Plan: s/p Procedure(s): AMPUTATION BELOW KNEE (Left) POD #1  D/C Narcotics HgB 8.8 Begin OOB with PT- minimum OOB to chair today Encourage IS and PO Will change dressing on Tuesday   LOS: 5 days    Tisa Dakin A 03/08/2024

## 2024-03-08 NOTE — Plan of Care (Signed)

## 2024-03-09 ENCOUNTER — Encounter: Payer: Self-pay | Admitting: Nurse Practitioner

## 2024-03-09 ENCOUNTER — Encounter: Payer: Self-pay | Admitting: Internal Medicine

## 2024-03-09 DIAGNOSIS — L03116 Cellulitis of left lower limb: Secondary | ICD-10-CM | POA: Diagnosis not present

## 2024-03-09 DIAGNOSIS — Z89432 Acquired absence of left foot: Secondary | ICD-10-CM | POA: Insufficient documentation

## 2024-03-09 DIAGNOSIS — R399 Unspecified symptoms and signs involving the genitourinary system: Secondary | ICD-10-CM | POA: Insufficient documentation

## 2024-03-09 LAB — CBC
HCT: 25.9 % — ABNORMAL LOW (ref 36.0–46.0)
Hemoglobin: 8.4 g/dL — ABNORMAL LOW (ref 12.0–15.0)
MCH: 29.2 pg (ref 26.0–34.0)
MCHC: 32.4 g/dL (ref 30.0–36.0)
MCV: 89.9 fL (ref 80.0–100.0)
Platelets: 348 K/uL (ref 150–400)
RBC: 2.88 MIL/uL — ABNORMAL LOW (ref 3.87–5.11)
RDW: 15.7 % — ABNORMAL HIGH (ref 11.5–15.5)
WBC: 15.5 K/uL — ABNORMAL HIGH (ref 4.0–10.5)
nRBC: 0 % (ref 0.0–0.2)

## 2024-03-09 LAB — BASIC METABOLIC PANEL WITH GFR
Anion gap: 11 (ref 5–15)
BUN: 33 mg/dL — ABNORMAL HIGH (ref 8–23)
CO2: 22 mmol/L (ref 22–32)
Calcium: 8.5 mg/dL — ABNORMAL LOW (ref 8.9–10.3)
Chloride: 102 mmol/L (ref 98–111)
Creatinine, Ser: 1.88 mg/dL — ABNORMAL HIGH (ref 0.44–1.00)
GFR, Estimated: 27 mL/min — ABNORMAL LOW (ref >60)
Glucose, Bld: 93 mg/dL (ref 70–99)
Potassium: 3.7 mmol/L (ref 3.5–5.1)
Sodium: 135 mmol/L (ref 135–145)

## 2024-03-09 LAB — GLUCOSE, CAPILLARY
Glucose-Capillary: 92 mg/dL (ref 70–99)
Glucose-Capillary: 95 mg/dL (ref 70–99)
Glucose-Capillary: 117 mg/dL — ABNORMAL HIGH (ref 70–99)
Glucose-Capillary: 128 mg/dL — ABNORMAL HIGH (ref 70–99)

## 2024-03-09 MED ORDER — LACTATED RINGERS IV SOLN: INTRAVENOUS | Status: AC

## 2024-03-09 MED ORDER — MORPHINE SULFATE (PF) 2 MG/ML IV SOLN
2.0000 mg | INTRAVENOUS | Status: DC | PRN
  Administered 2024-03-09 – 2024-03-10 (×5): 2 mg via INTRAVENOUS
  Filled 2024-03-09 (×5): qty 1

## 2024-03-09 MED ORDER — APIXABAN 5 MG PO TABS
5.0000 mg | ORAL_TABLET | Freq: Two times a day (BID) | ORAL | Status: DC
  Administered 2024-03-10 – 2024-03-16 (×13): 5 mg via ORAL
  Filled 2024-03-09 (×13): qty 1

## 2024-03-09 MED ORDER — POLYETHYLENE GLYCOL 3350 17 G PO PACK
17.0000 g | PACK | Freq: Every day | ORAL | Status: DC | PRN
  Administered 2024-03-10 (×2): 17 g via ORAL
  Filled 2024-03-09 (×2): qty 1

## 2024-03-09 NOTE — Progress Notes (Signed)
 Physical Therapy Treatment Patient Details Name: Denise Sharp MRN: [id number] DOB: 09-20-1945 Today's Date: 03/09/2024   History of Present Illness Pt is a 78 year old with hypertension, hyperlipidemia, CAD, anxiety, asthma, CKD, who presents from urology for chief concern of worsening wounds of the left foot.  Dx: Cellulitis of left foot, SIRS (systemic inflammatory response syndrome), Persistent atrial fibrillation. 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    PT Comments  PT/OT co treat this session for safety. Pt given morphine  prior to PT/OT session d/t pain. Pt demonstrates increased difficulty with transfers d/t decreased cognition and is unable to consistently follow instruction. During the session she vocalized hallucinations she was seeing during the session. She required max Ax2 for attempt for lateral scooting at EOB. Mod x2 to sit at EOB with assistance at trunk to sit up and assistance with her legs. Max multi-modal cuing throughout session for sequencing and technique. Pt able to roll with mod A, going down to min A with repetition. Would benefit from skilled PT to address above deficits and promote optimal return to PLOF.    If plan is discharge home, recommend the following: Assistance with cooking/housework;Assist for transportation;Help with stairs or ramp for entrance;Two people to help with walking and/or transfers;Two people to help with bathing/dressing/bathroom;Supervision due to cognitive status   Can travel by private vehicle     No  Equipment Recommendations  Hospital bed;Hoyer lift    Recommendations for Other Services       Precautions / Restrictions Precautions Precautions: Fall Recall of Precautions/Restrictions: Impaired Restrictions Weight Bearing Restrictions Per Provider Order: No     Mobility  Bed Mobility Overal bed mobility: Needs Assistance Bed Mobility: Supine to Sit, Sit to Supine, Rolling Rolling: Min assist, Mod assist    Supine to sit: Mod assist, +2 for physical assistance Sit to supine: Mod assist, +2 for physical assistance   General bed mobility comments: Able to initiate movement however required physical assistance for LEs and sitting trunk up. Pt required assistance to scoot to EOB. Max A x2 to boost up in bed, but attempted to help using her RLE.    Transfers Overall transfer level: Needs assistance                 General transfer comment: Unable to stand at EOB d/t pt cognition being decrease and inability to follow instruction consistently. Prior to PT session, pt being given morphine  which could have been affecting pt's cognition. Max Ax2 for attempted scoots at EOB, which was unsuccessful.    Ambulation/Gait               General Gait Details: unable attempt ambulation safely   Stairs             Wheelchair Mobility     Tilt Bed    Modified Rankin (Stroke Patients Only)       Balance   Sitting-balance support: Feet supported, Single extremity supported Sitting balance-Leahy Scale: Poor Sitting balance - Comments: Required min physical assistance to maintain balance at EOB.       Standing balance comment: unable to come to stand d/t weakness                            Communication Communication Communication: Impaired Factors Affecting Communication: Difficulty expressing self (slurred speech intermittently.)  Cognition Arousal: Lethargic, Suspect due to medications Behavior During Therapy: WFL for tasks assessed/performed   PT - Cognitive impairments:  Awareness, Sequencing, Attention                       PT - Cognition Comments: Pt very confused today, possibly d/t medications. Able to follow commands though inconsistently. Following commands: Impaired Following commands impaired: Follows one step commands inconsistently    Cueing Cueing Techniques: Verbal cues, Tactile cues, Visual cues  Exercises Other Exercises Other  Exercises: Scoots at EOB. Max A x2. Multi-modal cuing from SPT and OT however pt unable to understand instructions    General Comments        Pertinent Vitals/Pain Pain Assessment Pain Assessment: No/denies pain    Home Living                          Prior Function            PT Goals (current goals can now be found in the care plan section) Acute Rehab PT Goals Patient Stated Goal: none stated PT Goal Formulation: With patient Time For Goal Achievement: 03/22/24 Potential to Achieve Goals: Good Progress towards PT goals: Progressing toward goals    Frequency    Min 2X/week      PT Plan      Co-evaluation PT/OT/SLP Co-Evaluation/Treatment: Yes Reason for Co-Treatment: For patient/therapist safety;To address functional/ADL transfers PT goals addressed during session: Mobility/safety with mobility OT goals addressed during session: ADL's and self-care      AM-PAC PT 6 Clicks Mobility   Outcome Measure  Help needed turning from your back to your side while in a flat bed without using bedrails?: A Little Help needed moving from lying on your back to sitting on the side of a flat bed without using bedrails?: A Lot Help needed moving to and from a bed to a chair (including a wheelchair)?: Total Help needed standing up from a chair using your arms (e.g., wheelchair or bedside chair)?: Total Help needed to walk in hospital room?: Total Help needed climbing 3-5 steps with a railing? : Total 6 Click Score: 9    End of Session Equipment Utilized During Treatment: Gait belt Activity Tolerance: Patient limited by lethargy (recently been given morphine  at time of therapy session) Patient left: in bed;with call bell/phone within reach;with bed alarm set;with family/visitor present Nurse Communication: Mobility status PT Visit Diagnosis: Unsteadiness on feet (R26.81);Muscle weakness (generalized) (M62.81);Difficulty in walking, not elsewhere classified  (R26.2)     Time: 8597-8567 PT Time Calculation (min) (ACUTE ONLY): 30 min  Charges:                            Cherese Lozano, SPT    Kalid Ghan 03/09/2024, 3:31 PM

## 2024-03-09 NOTE — Assessment & Plan Note (Signed)
 Experiences low blood pressure episodes, with readings as low as 79/40 mmHg. Blood pressure is well-managed at 118/62 mmHg during the visit. She takes midodrine. Plans to follow up with the heart failure clinic for blood pressure management. Strict precautions given to patient.

## 2024-03-09 NOTE — Assessment & Plan Note (Signed)
 Cellulitis improved after IV and oral antibiotics. Blood cultures negative.

## 2024-03-09 NOTE — Assessment & Plan Note (Signed)
 She uses a boot and limits weight-bearing activities. Under podiatrist care with a follow-up in September for repeat x-rays. A special brace may be considered if the foot does not improve. Follow up with specialists as scheduled.

## 2024-03-09 NOTE — Assessment & Plan Note (Signed)
 Recently diagnosed with a UTI while in nursing facility. She was start on a 5-day antibiotic course. Symptoms improving. Encouraged to complete antibiotic as prescribed. Advise adequate hydration. Return precautions given to patient.

## 2024-03-09 NOTE — Progress Notes (Signed)
 PROGRESS NOTE    Denise Sharp  FMW:[id number] DOB: 04/25/1946 DOA: 03/03/2024 PCP: Denise Verneita CROME, MD  Chief Complaint  Patient presents with   Wound Infection    Hospital Course:  Denise Sharp is a 78 year old with hypertension, hyperlipidemia, CAD, anxiety, asthma, CKD, who presents from urology for chief concern of worsening wounds of the left foot, reports low grade fever at home, chills and increasing pain with discharge of her left foot. Progressive charcoaled arthropathy vs superimposed OM as per XR. MRI with worsening Charot joint and possible OM, Podiatry recommend Left BKA which patient underwent on 08/23 by Vascular Surgery Hospital course as below   Subjective: Patient was examined at the bedside, spouse Denise Sharp present. Patient still confused but more alert today than yesterday. Encouraged po intake Discussed updates at bedside with spouse     Objective: Vitals:   03/08/24 1714 03/08/24 1954 03/09/24 0400 03/09/24 0840  BP: (!) 157/113 (!) 88/50 (!) 100/50 (!) 114/59  Pulse: 79 75 83 86  Resp: 16 18  19   Temp: 100.1 F (37.8 C) 98.2 F (36.8 C) 97.6 F (36.4 C) 98.2 F (36.8 C)  TempSrc: Oral   Oral  SpO2: 98% 94% (!) 89% 100%  Weight:      Height:        Intake/Output Summary (Last 24 hours) at 03/09/2024 1516 Last data filed at 03/09/2024 1300 Gross per 24 hour  Intake 180 ml  Output 1750 ml  Net -1570 ml   Filed Weights   03/03/24 1826 03/08/24 0825  Weight: 110.3 kg 112.9 kg    Examination: Constitutional: appears age-appropriate, NAD, confused Neck: normal, supple, no masses, no thyromegaly Respiratory: clear to auscultation bilaterally, no wheezing, no crackles. Normal respiratory effort. No accessory muscle use.  Cardiovascular: Regular rate and rhythm, no murmurs / rubs / gallops. No extremity edema. 2+ pedal pulses. No carotid bruits.  Abdomen: Morbidly obese abdomen, no tenderness, no masses palpated, no hepatosplenomegaly. Bowel  sounds positive.  Musculoskeletal: no clubbing / cyanosis. L BKA surgical dressing Skin: + left lateral foot/ankle lesion with increased erythema and edema Neurologic: Sensation intact. 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 Met SIRS criteria on adm (fever, leukocytosis, lactic acidosis) - Bcx 1/4: GPC (Staph species), likely contaminant - discussed with ID curbside - XR Left foot advanced osseous destructive changes and fragmentation of the midfoot, consistent with Charcot arthropathy.  Suspect progressive erosive change and osseous destruction of calcaneus, talus, cuboid navicular and cuneiform bones.  Increased effusion  and soft tissue swelling.  Progressive charcoaled arthropathy vs superimposed OM - MRI Left foot shows severe Charcot changes, findings concerning for early OM vs reactive changes throughout talus. No evidence of abscess - Seen by Podiatry, recommend Left BKA - Seen by Vascular surgery, s/p Left BKA 08/23, pathology sent - Resume  Eliquis   LUE cellulitis - improving - On abx as above - on po Cephalexin   Acute toxic/metabolic encephalopathy on 08/24 - Suspect multifactorial due to medication (opioids, hydroxyzine , zanaflex ), hospital acquired deirium, AKI on CKD - No deficits on exam, Hx TIA as per daughter, CT Head negative for acute changes - Discontinue Zanaflex , Hydroxyzine , Opioids. Continue Lyrica , Cymbalta  - Monitor mental status  Left knee pain s/p Total left knee arthroplasty - resolved - XR No acute fracture or dislocation - Pain management  AKI on CKD stage 3a baseline appears 1.1-1.3, Suspect AKI 2/2 infectious etiology and hypotension on admission Cr inc 1.88, Hold torsemide  IV fluids 50cc/hr due to poor po intake Seen by nephrology, appreciate recs Monitor Cr  Hypotension Hx HTN Reports having BP discrepancy, higher in LE > UE chronically low SBP in 90's S/p IV Albumin , avoid IV fluids due to hx CHF Monitor BP   Persistent atrial fibrillation (HCC) - Eliquis  held for Left BKA  Normocytic anemia Iron  deficiency anemia Patient reports that she gets iron  infusion Hb slowly down trending, 8.4 Outpatient follow-up as above.  NICM with preserved ejection fraction - On torsemide  daily - held   Type II diabetes mellitus with neurological manifestations (HCC) Home long acting insulin , hold Lantus  25u - poor po intake due to AMS and AKI Insulin  SSI with HS coverage Goal inpatient blood glucose level 140-180   Chronic anxiety Home duloxetine  60 mg daily resumed   Hyperlipidemia Home atorvastatin  80 mg nightly resumed   Major depressive disorder with current active episode Home duloxetine  60 mg daily resumed   Morbid obesity This complicates overall care and prognosis.    History of gout Allopurinol  100 mg daily, colchicine  prn only   GAD (generalized anxiety disorder) PDMP reviewed  Obesity hypoventilation syndrome (HCC) Cpap at bedtime ordered   Weakness Fall  precaution PT/OT rec SNF when ready  DVT prophylaxis: Eliquis    Code Status: Full Code Disposition:  SNF when ready  Consultants:  Podiatry Vascular Surgery  Procedures:  Left BKA  Antimicrobials:  Anti-infectives (From admission, onward)    Start     Dose/Rate Route Frequency Ordered Stop   03/08/24 1800  cephALEXin  (KEFLEX ) capsule 500 mg        500 mg Oral Every 6 hours 03/08/24 1419     03/06/24 0500  ceFEPIme  (MAXIPIME ) 2 g in sodium chloride  0.9 % 100 mL IVPB  Status:  Discontinued        2 g 200 mL/hr over 30 Minutes Intravenous Every 12 hours 03/05/24 2309 03/08/24 1419   03/05/24 1700  vancomycin  (VANCOCIN ) IVPB 1000 mg/200 mL premix  Status:  Discontinued        1,000 mg 200 mL/hr over 60 Minutes Intravenous Every 24 hours 03/05/24 0813 03/08/24 0828   03/05/24 1200  ceFEPIme  (MAXIPIME ) 2 g in sodium chloride  0.9 % 100 mL IVPB  Status:  Discontinued        2 g 200 mL/hr over 30 Minutes Intravenous 2 times daily 03/05/24 1128 03/05/24 2309   03/04/24 1700  vancomycin  (VANCOREADY) IVPB 1000 mg/200 mL  Status:  Discontinued        1,000 mg 200 mL/hr over 60  Minutes Intravenous Every 24 hours 03/03/24 1738 03/05/24 0813   03/03/24 1730  vancomycin  (VANCOREADY) IVPB 2000 mg/400 mL        2,000 mg 200 mL/hr over 120 Minutes Intravenous  Once 03/03/24 1729 03/04/24 0534   03/03/24 1645  vancomycin  (VANCOCIN ) IVPB 1000 mg/200 mL premix  Status:  Discontinued        1,000 mg 200 mL/hr over 60 Minutes Intravenous  Once 03/03/24 1635 03/03/24 1729       Data Reviewed: I have personally reviewed following labs and imaging studies CBC: Recent Labs  Lab 03/03/24 1124 03/03/24 1452 03/04/24 0548 03/05/24 0432 03/06/24 0950 03/08/24 0549 03/09/24 0422  WBC 12.2* 15.0* 15.1* 12.0* 12.7* 13.8* 15.5*  NEUTROABS 8.6* 10.8*  --   --   --   --   --   HGB 11.9* 12.5 10.3* 10.0* 9.5* 8.8* 8.4*  HCT 38.2 40.7 32.8* 32.2* 30.4* 27.5* 25.9*  MCV 89.3 93.8 91.6 92.0 91.0 90.5  89.9  PLT 388.0 421* 366 346 339 347 348   Basic Metabolic Panel: Recent Labs  Lab 03/04/24 0548 03/05/24 0432 03/06/24 0950 03/08/24 0549 03/09/24 0422  NA 141 137 134* 137 135  K 4.1 4.0 4.0 4.2 3.7  CL 109 106 105 104 102  CO2 23 23 20* 24 22  GLUCOSE 132* 94 154* 100* 93  BUN 16 21 21  29* 33*  CREATININE 1.17* 1.22* 1.21* 1.57* 1.88*  CALCIUM  8.2* 7.9* 8.4* 8.2* 8.5*   GFR: Estimated Creatinine Clearance: 30.4 mL/min (A) (by C-G formula based on SCr of 1.88 mg/dL (H)). Liver Function Tests: Recent Labs  Lab 03/03/24 1452  AST 18  ALT 14  ALKPHOS 165*  BILITOT 0.7  PROT 6.9  ALBUMIN  2.9*   CBG: Recent Labs  Lab 03/08/24 1719 03/08/24 1748 03/08/24 1951 03/09/24 0836 03/09/24 1212  GLUCAP 60* 89 97 92 95    Recent Results (from the past 240 hours)  Blood culture (routine x 2)     Status: Abnormal   Collection Time: 03/03/24  5:01 PM   Specimen: BLOOD RIGHT FOREARM  Result Value Ref Range Status   Specimen Description   Final    BLOOD RIGHT FOREARM Performed at [facility], [address] [place], [place] [address]    Special Requests   Final    BOTTLES DRAWN AEROBIC AND ANAEROBIC Blood Culture results may not be optimal due to an inadequate volume of blood received in culture bottles Performed at [facility], [address] [place], [place] [address]    Culture  Setup Time   Final    GRAM POSITIVE COCCI ANAEROBIC BOTTLE ONLY CRITICAL RESULT CALLED TO, READ BACK BY AND VERIFIED WITH: TIFFANY GILCHRIST, PHARMD AT 1010 ON 03/04/24 GM    Culture (A)  Final    STAPHYLOCOCCUS HOMINIS THE SIGNIFICANCE OF ISOLATING THIS ORGANISM FROM A SINGLE SET OF BLOOD CULTURES WHEN MULTIPLE SETS ARE DRAWN IS UNCERTAIN. PLEASE NOTIFY THE MICROBIOLOGY DEPARTMENT WITHIN ONE WEEK IF SPECIATION AND SENSITIVITIES ARE REQUIRED. Performed at [facility], [address] [place], [place] [address]    Report Status 03/06/2024 FINAL  Final  Blood  Culture ID Panel (Reflexed)     Status: Abnormal   Collection Time: 03/03/24  5:01 PM  Result Value Ref Range Status   Enterococcus faecalis NOT DETECTED NOT DETECTED Final   Enterococcus Faecium NOT DETECTED NOT DETECTED Final   Listeria monocytogenes NOT DETECTED NOT DETECTED Final   Staphylococcus  species DETECTED (A) NOT DETECTED Final    Comment: CRITICAL RESULT CALLED TO, READ BACK BY AND VERIFIED WITH: TIFFANY GILCHRIST, PHARMD AT 1010 ON 03/04/24 GM    Staphylococcus aureus (BCID) NOT DETECTED NOT DETECTED Final   Staphylococcus epidermidis NOT DETECTED NOT DETECTED Final   Staphylococcus lugdunensis NOT DETECTED NOT DETECTED Final   Streptococcus species NOT DETECTED NOT DETECTED Final   Streptococcus agalactiae NOT DETECTED NOT DETECTED Final   Streptococcus pneumoniae NOT DETECTED NOT DETECTED Final   Streptococcus pyogenes NOT DETECTED NOT DETECTED Final   A.calcoaceticus-baumannii NOT DETECTED NOT DETECTED Final   Bacteroides fragilis NOT DETECTED NOT DETECTED Final   Enterobacterales NOT DETECTED NOT DETECTED Final   Enterobacter cloacae complex NOT DETECTED NOT DETECTED Final   Escherichia coli NOT DETECTED NOT DETECTED Final   Klebsiella aerogenes NOT DETECTED NOT DETECTED Final   Klebsiella oxytoca NOT DETECTED NOT DETECTED Final   Klebsiella pneumoniae NOT DETECTED NOT DETECTED Final   Proteus species NOT DETECTED NOT DETECTED Final   Salmonella species NOT DETECTED NOT DETECTED Final   Serratia marcescens NOT DETECTED NOT DETECTED Final   Haemophilus influenzae NOT DETECTED NOT DETECTED Final   Neisseria meningitidis NOT DETECTED NOT DETECTED Final   Pseudomonas aeruginosa NOT DETECTED NOT DETECTED Final   Stenotrophomonas maltophilia NOT DETECTED NOT DETECTED Final   Candida albicans NOT DETECTED NOT DETECTED Final   Candida auris NOT DETECTED NOT DETECTED Final   Candida glabrata NOT DETECTED NOT DETECTED Final   Candida krusei NOT DETECTED NOT DETECTED Final    Candida parapsilosis NOT DETECTED NOT DETECTED Final   Candida tropicalis NOT DETECTED NOT DETECTED Final   Cryptococcus neoformans/gattii NOT DETECTED NOT DETECTED Final    Comment: Performed at [facility], [address] [place], [place] [address]  Blood culture (routine x 2)     Status: None   Collection Time: 03/03/24  7:01 PM   Specimen: BLOOD  Result Value Ref Range Status   Specimen Description BLOOD BLOOD RIGHT HAND  Final   Special Requests   Final    BOTTLES DRAWN AEROBIC AND ANAEROBIC Blood Culture results may not be optimal due to an inadequate volume of blood received in culture bottles   Culture   Final    NO GROWTH 5 DAYS Performed at [facility], [address] [place], [place] [address]    Report Status 03/08/2024 FINAL  Final     Radiology Studies: CT HEAD WO CONTRAST ( ) Result Date: 03/08/2024 CLINICAL DATA:  Delirium EXAM: CT HEAD WITHOUT CONTRAST TECHNIQUE: Contiguous axial images were obtained from the base of the skull through the vertex without intravenous contrast. RADIATION DOSE REDUCTION: This exam was performed according to the departmental dose-optimization program which includes automated exposure control, adjustment of the mA and/or kV according to patient size and/or use of iterative reconstruction technique. COMPARISON:  CT head 02/08/2021.  MRI head 03/01/2022. FINDINGS: Brain: No evidence of acute infarction, hemorrhage, hydrocephalus, extra-axial collection or mass lesion/mass effect. Moderate to advanced patchy white matter hypodensities are nonspecific but compatible with chronic microvascular ischemic disease. Vascular: Calcific atherosclerosis. Skull: No acute fracture. Sinuses/Orbits: Clear sinuses.  No acute orbital findings. Other: No mastoid effusions. IMPRESSION: 1. No evidence of acute intracranial abnormality. 2. Moderate to advanced chronic microvascular ischemic disease. Electronically Signed   By: Gilmore GORMAN Molt M.D.    On: 03/08/2024 22:36     Scheduled Meds:  allopurinol   100 mg Oral Daily   atorvastatin   80 mg Oral QHS  cephALEXin   500 mg Oral Q6H   dapagliflozin  propanediol  10 mg Oral Daily   docusate sodium   100 mg Oral Daily   DULoxetine   60 mg Oral Daily   ezetimibe   10 mg Oral Daily   Finerenone   10 mg Oral q AM   fluticasone  furoate-vilanterol  1 puff Inhalation Daily   insulin  aspart  0-15 Units Subcutaneous TID WC   insulin  aspart  0-5 Units Subcutaneous QHS   pantoprazole   40 mg Oral Daily   pregabalin   50 mg Oral BID   Continuous Infusions:  lactated ringers  50 mL/hr at 03/09/24 1349     LOS: 6 days  MDM: Patient is high risk for one or more organ failure.  They necessitate ongoing hospitalization for continued IV therapies and subsequent lab monitoring. Total time spent interpreting labs and vitals, reviewing the medical record, coordinating care amongst consultants and care team members, directly assessing and discussing care with the patient and/or family: 55 min  Laree Lock, MD [facility]  To contact the attending physician between 7A-7P please use Epic Chat. To contact the covering physician during after hours 7P-7A, please review Amion.  03/09/2024, 3:16 PM   *This document has been created with the assistance of dictation software. Please excuse typographical errors. *

## 2024-03-09 NOTE — Progress Notes (Signed)
 [facility]  ROUNDING NOTE   Subjective:   Denise Sharp is a 78 y.o. female with past medical conditions including CAD, hyperlipidemia, hypertension, and chronic kidney disease IIIb. She presents to the ED for evaluation for wound infection and has been admitted for Cellulitis of left foot [L03.116]  Patient is known to our practice and is followed by Dr Dominica. Patient was last seen in office on 01/07/24 for routine follow  She is seen laying in bed, husband at bedside. Patient states she wasn't able to ambulate without a walker or assistance. 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 Does complain of left hand edema, soreness and decreased dexterity. Mentation questionable, wandering conversations, word salad.   Labs on ED arrival acceptable for renal patient. Renal function has declined since admission, creatinine 1.88 with GFR 27 today. Baseline function is creatinine 1.2 with GFR 45.   We have been consulted to evaluate acute kidney injury   Objective:  Vital signs in last 24 hours:  Temp:  [97.6 F (36.4 C)-100.1 F (37.8 C)] 98.2 F (36.8 C) (08/25 0840) Pulse Rate:  [75-86] 86 (08/25 0840) Resp:  [16-19] 19 (08/25 0840) BP: (88-157)/(50-113) 114/59 (08/25 0840) SpO2:  [89 %-100 %] 100 % (08/25 0840)  Weight change:  Filed Weights   03/03/24 1826 03/08/24 0825  Weight: 110.3 kg 112.9 kg    Intake/Output: I/O last 3 completed shifts: In: -  Out: 2000 [Urine:2000]   Intake/Output this shift:  Total I/O In: -  Out: 450 [Urine:450]  Physical Exam: General: NAD  Head: Normocephalic, atraumatic. Moist oral mucosal membranes  Eyes: Anicteric  Lungs:  Clear to auscultation  Heart: Regular rate and rhythm  Abdomen:  Soft, nontender  Extremities:  No peripheral edema.Lt BKA  Neurologic: Awake, alert, conversant  Skin: Warm,dry, no rash       Basic Metabolic Panel: Recent Labs  Lab 03/04/24 0548 03/05/24 0432 03/06/24 0950 03/08/24 0549  03/09/24 0422  NA 141 137 134* 137 135  K 4.1 4.0 4.0 4.2 3.7  CL 109 106 105 104 102  CO2 23 23 20* 24 22  GLUCOSE 132* 94 154* 100* 93  BUN 16 21 21  29* 33*  CREATININE 1.17* 1.22* 1.21* 1.57* 1.88*  CALCIUM  8.2* 7.9* 8.4* 8.2* 8.5*    Liver Function Tests: Recent Labs  Lab 03/03/24 1452  AST 18  ALT 14  ALKPHOS 165*  BILITOT 0.7  PROT 6.9  ALBUMIN  2.9*   No results for input(s): LIPASE, AMYLASE in the last 168 hours. No results for input(s): AMMONIA in the last 168 hours.  CBC: Recent Labs  Lab 03/03/24 1124 03/03/24 1452 03/04/24 0548 03/05/24 0432 03/06/24 0950 03/08/24 0549 03/09/24 0422  WBC 12.2* 15.0* 15.1* 12.0* 12.7* 13.8* 15.5*  NEUTROABS 8.6* 10.8*  --   --   --   --   --   HGB 11.9* 12.5 10.3* 10.0* 9.5* 8.8* 8.4*  HCT 38.2 40.7 32.8* 32.2* 30.4* 27.5* 25.9*  MCV 89.3 93.8 91.6 92.0 91.0 90.5 89.9  PLT 388.0 421* 366 346 339 347 348    Cardiac Enzymes: No results for input(s): CKTOTAL, CKMB, CKMBINDEX, TROPONINI in the last 168 hours.  BNP: Invalid input(s): POCBNP  CBG: Recent Labs  Lab 03/08/24 1719 03/08/24 1748 03/08/24 1951 03/09/24 0836 03/09/24 1212  GLUCAP 60* 89 97 92 95    Microbiology: Results for orders placed or performed during the hospital encounter of 03/03/24  Blood culture (routine x 2)  Status: Abnormal   Collection Time: 03/03/24  5:01 PM   Specimen: BLOOD RIGHT FOREARM  Result Value Ref Range Status   Specimen Description   Final    BLOOD RIGHT FOREARM Performed at [facility], [address], [place], [place] [address]    Special Requests   Final    BOTTLES DRAWN AEROBIC AND ANAEROBIC Blood Culture results may not be optimal due to an inadequate volume of blood received in culture bottles Performed at [facility], [address] [place], [place] [address]    Culture  Setup Time   Final    GRAM POSITIVE COCCI ANAEROBIC BOTTLE ONLY CRITICAL RESULT CALLED TO, READ BACK  BY AND VERIFIED WITH: TIFFANY GILCHRIST, PHARMD AT 1010 ON 03/04/24 GM    Culture (A)  Final    STAPHYLOCOCCUS HOMINIS THE SIGNIFICANCE OF ISOLATING THIS ORGANISM FROM A SINGLE SET OF BLOOD CULTURES WHEN MULTIPLE SETS ARE DRAWN IS UNCERTAIN. PLEASE NOTIFY THE MICROBIOLOGY DEPARTMENT WITHIN ONE WEEK IF SPECIATION AND SENSITIVITIES ARE REQUIRED. Performed at [facility], [address], [place], [place] [address]    Report Status 03/06/2024 FINAL  Final  Blood Culture ID Panel (Reflexed)     Status: Abnormal   Collection Time: 03/03/24  5:01 PM  Result Value Ref Range Status   Enterococcus faecalis NOT DETECTED NOT DETECTED Final   Enterococcus Faecium NOT DETECTED NOT DETECTED Final   Listeria monocytogenes NOT DETECTED NOT DETECTED Final   Staphylococcus species DETECTED (A) NOT DETECTED Final    Comment: CRITICAL RESULT CALLED TO, READ BACK BY AND VERIFIED WITH: TIFFANY GILCHRIST, PHARMD AT 1010 ON 03/04/24 GM    Staphylococcus aureus (BCID) NOT DETECTED NOT DETECTED Final   Staphylococcus epidermidis NOT DETECTED NOT DETECTED Final   Staphylococcus lugdunensis NOT DETECTED NOT DETECTED Final   Streptococcus species NOT DETECTED NOT DETECTED Final   Streptococcus agalactiae NOT DETECTED NOT DETECTED Final   Streptococcus pneumoniae NOT DETECTED NOT DETECTED Final   Streptococcus pyogenes NOT DETECTED NOT DETECTED Final   A.calcoaceticus-baumannii NOT DETECTED NOT DETECTED Final   Bacteroides fragilis NOT DETECTED NOT DETECTED Final   Enterobacterales NOT DETECTED NOT DETECTED Final   Enterobacter cloacae complex NOT DETECTED NOT DETECTED Final   Escherichia coli NOT DETECTED NOT DETECTED Final   Klebsiella aerogenes NOT DETECTED NOT DETECTED Final   Klebsiella oxytoca NOT DETECTED NOT DETECTED Final   Klebsiella pneumoniae NOT DETECTED NOT DETECTED Final   Proteus species NOT DETECTED NOT DETECTED Final   Salmonella species NOT DETECTED NOT DETECTED Final   Serratia  marcescens NOT DETECTED NOT DETECTED Final   Haemophilus influenzae NOT DETECTED NOT DETECTED Final   Neisseria meningitidis NOT DETECTED NOT DETECTED Final   Pseudomonas aeruginosa NOT DETECTED NOT DETECTED Final   Stenotrophomonas maltophilia NOT DETECTED NOT DETECTED Final   Candida albicans NOT DETECTED NOT DETECTED Final   Candida auris NOT DETECTED NOT DETECTED Final   Candida glabrata NOT DETECTED NOT DETECTED Final   Candida krusei NOT DETECTED NOT DETECTED Final   Candida parapsilosis NOT DETECTED NOT DETECTED Final   Candida tropicalis NOT DETECTED NOT DETECTED Final   Cryptococcus neoformans/gattii NOT DETECTED NOT DETECTED Final    Comment: Performed at [facility], [address] [place], [place] [address]  Blood culture (routine x 2)     Status: None   Collection Time: 03/03/24  7:01 PM   Specimen: BLOOD  Result Value Ref Range Status   Specimen Description BLOOD BLOOD RIGHT  HAND  Final   Special Requests   Final    BOTTLES DRAWN AEROBIC AND ANAEROBIC Blood Culture results may not be optimal due to an inadequate volume of blood received in culture bottles   Culture   Final    NO GROWTH 5 DAYS Performed at [facility], [address] [place], [place] [address]    Report Status 03/08/2024 FINAL  Final   *Note: Due to a large number of results and/or encounters for the requested time period, some results have not been displayed. A complete set of results can be found in Results Review.    Coagulation Studies: No results for input(s): LABPROT, INR in the last 72 hours.  Urinalysis: No results for input(s): COLORURINE, LABSPEC, PHURINE, GLUCOSEU, HGBUR, BILIRUBINUR, KETONESUR, PROTEINUR, UROBILINOGEN, NITRITE, LEUKOCYTESUR in the last 72 hours.  Invalid input(s): APPERANCEUR    Imaging: CT HEAD WO CONTRAST ( ) Result Date: 03/08/2024 CLINICAL DATA:  Delirium EXAM: CT HEAD WITHOUT CONTRAST TECHNIQUE: Contiguous  axial images were obtained from the base of the skull through the vertex without intravenous contrast. RADIATION DOSE REDUCTION: This exam was performed according to the departmental dose-optimization program which includes automated exposure control, adjustment of the mA and/or kV according to patient size and/or use of iterative reconstruction technique. COMPARISON:  CT head 02/08/2021.  MRI head 03/01/2022. FINDINGS: Brain: No evidence of acute infarction, hemorrhage, hydrocephalus, extra-axial collection or mass lesion/mass effect. Moderate to advanced patchy white matter hypodensities are nonspecific but compatible with chronic microvascular ischemic disease. Vascular: Calcific atherosclerosis. Skull: No acute fracture. Sinuses/Orbits: Clear sinuses.  No acute orbital findings. Other: No mastoid effusions. IMPRESSION: 1. No evidence of acute intracranial abnormality. 2. Moderate to advanced chronic microvascular ischemic disease. Electronically Signed   By: Gilmore GORMAN Molt M.D.   On: 03/08/2024 22:36     Medications:    lactated ringers  50 mL/hr at 03/09/24 1349    allopurinol   100 mg Oral Daily   atorvastatin   80 mg Oral QHS   cephALEXin   500 mg Oral Q6H   dapagliflozin  propanediol  10 mg Oral Daily   docusate sodium   100 mg Oral Daily   DULoxetine   60 mg Oral Daily   ezetimibe   10 mg Oral Daily   Finerenone   10 mg Oral q AM   fluticasone  furoate-vilanterol  1 puff Inhalation Daily   insulin  aspart  0-15 Units Subcutaneous TID WC   insulin  aspart  0-5 Units Subcutaneous QHS   pantoprazole   40 mg Oral Daily   pregabalin   50 mg Oral BID   fluticasone , lidocaine , morphine  injection, nitroGLYCERIN , nystatin , senna-docusate  Assessment/ Plan:  [name] is a 78 y.o.  female with past medical conditions including CAD, hyperlipidemia, hypertension, and chronic kidney disease IIIb. She presents to the ED for evaluation for wound infection and has been admitted for Cellulitis of  left foot [L03.116]   Acute Kidney Injury on chronic kidney disease stage IIIb with baseline creatinine 1.2 and GFR of 45.  Acute kidney injury secondary to infection and hypotension.  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 Will order IV hydration, lactated ringers  at 62ml/hr due to poor oral intake. Will monitor during this admission. No indication for dialysis at this time.   Lab Results  Component Value Date   CREATININE 1.88 (H) 03/09/2024   CREATININE 1.57 (H) 03/08/2024   CREATININE 1.21 (H) 03/06/2024    Intake/Output Summary (Last 24 hours) at 03/09/2024  1423 Last data filed at 03/09/2024 0900 Gross per 24 hour  Intake --  Output 1750 ml  Net -1750 ml    2. Anemia of chronic kidney disease Lab Results  Component Value Date   HGB 8.4 (L) 03/09/2024    Hgb decreased, likely due to kidney injury. Will monitor for need of ESA.   3. Secondary Hyperparathyroidism: with outpatient labs: PTH 149, phosphorus 4.5, calcium  8.9 on 09/30/23.   Lab Results  Component Value Date   CALCIUM  8.5 (L) 03/09/2024   CAION 1.17 06/04/2022    Will continue to monitor bone minerals during this admission   4. Cellulitis, left foot wound with possible osteomyelitis. 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 Remains on oral Cephalexin .     LOS: 6 Eda Magnussen 8/25/20252:23 PM

## 2024-03-09 NOTE — Assessment & Plan Note (Addendum)
 Discharged from nursing facility 4 days ago after hospitalization for cellulitis and charcot arthropathy. Summary, labs, and imaging reviewed today. Patient has home health, PT and OT. No questions at this time.

## 2024-03-09 NOTE — Evaluation (Signed)
 Occupational Therapy Evaluation Patient Details Name: Denise Sharp MRN: [id number] DOB: 04-Sep-1945 Today's Date: 03/09/2024   History of Present Illness   Pt is a 78 year old with hypertension, hyperlipidemia, CAD, anxiety, asthma, CKD, who presents from urology for chief concern of worsening wounds of the left foot.  Dx: Cellulitis of left foot, SIRS (systemic inflammatory response syndrome), Persistent atrial fibrillation. 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     Clinical Impressions Pt was seen for OT evaluation and PT co-treat this date. PTA, pt lived with her husband in a 1 story home with ramped entrance. She utilized a RW for ~2 years for mobility within the home and uses a scooter fo MD appts and at the grocery store. Pt had returned to ambulating short distances with RW and had assist for all ADLs from spouse.  Pt presents with deficits in strength, balance, activity tolerance and safety awareness/awareness of deficits, affecting safe and optimal ADL completion. Pt currently requires Min to Mod A for rolling to bil sides in bed using bed rails with cues for hand placement. Mod A x2 to perform bed mobility with cues for sequencing, but pt with good initiation requires step by step frequent cueing to remain on task. Lethargic and confused with difficulty following one step commands consistently. Pt hallucinating during session and speaking garbled as she had just recently had morphine . No pain during session. She required Max A x2 for all lateral scoot at EOB attempted both to the L side and the R side. D/t lethargy and poor cognition opted not to place in her in recliner. She was returned to supine and scooted to [facility] with Max A x2 and pt able to hold bed rail with RUE only to assist. Pt with limited to no use of LUE during session d/t painful L hand (has been this way since last week.)  Pt would benefit from skilled OT services to address noted impairments and functional  limitations to maximize safety and independence while minimizing future risk of falls, injury, and readmission. Do anticipate the need for follow up OT services upon acute hospital DC.      If plan is discharge home, recommend the following:   A lot of help with bathing/dressing/bathroom;Help with stairs or ramp for entrance;Assistance with cooking/housework;Two people to help with walking and/or transfers     Functional Status Assessment   Patient has had a recent decline in their functional status and demonstrates the ability to make significant improvements in function in a reasonable and predictable amount of time.     Equipment Recommendations   Other (comment) (defer to next venue)     Recommendations for Other Services         Precautions/Restrictions   Precautions Precautions: Fall Recall of Precautions/Restrictions: Impaired Restrictions Weight Bearing Restrictions Per Provider Order: Yes LLE Weight Bearing Per Provider Order: Non weight bearing Other Position/Activity Restrictions: has immobilizer over amputation site     Mobility Bed Mobility Overal bed mobility: Needs Assistance Bed Mobility: Supine to Sit, Sit to Supine, Rolling Rolling: Min assist, Mod assist   Supine to sit: Mod assist, +2 for physical assistance Sit to supine: Mod assist, +2 for physical assistance   General bed mobility comments: initiated movements, needed increased cues to continuation d/t lethargy from pain meds; rolls with Min A, Mod A x2 to reach EOB d/t increased difficulty scooting and utilizing LUE; Max A x2 to lateral scoot to L and R on EOB    Transfers  General transfer comment: deferred STS d/t pt poor cognition and lethargy, unable to follow commands to lateral scoot and required Max A x2 to do so      Balance Overall balance assessment: Needs assistance Sitting-balance support: Feet supported, Single extremity supported Sitting  balance-Leahy Scale: Poor Sitting balance - Comments: min A to maintain seated balance       Standing balance comment: deferred d/t cognition and Max A X2 for lateral scoots at EOB                           ADL either performed or assessed with clinical judgement   ADL Overall ADL's : Needs assistance/impaired                                       General ADL Comments: anticipate Min A for UB ADLs and Max A for LB ADLs     Vision         Perception         Praxis         Pertinent Vitals/Pain Pain Assessment Pain Assessment: No/denies pain Pain Intervention(s): Limited activity within patient's tolerance, Monitored during session, Repositioned     Extremity/Trunk Assessment Upper Extremity Assessment LUE Deficits / Details: limited ability to take force through L UE due to pain and swelling. Holds it guarded at side.   Lower Extremity Assessment LLE Deficits / Details: NWB due to L transtibial ambulation, unable to support bodyweight on R LE only (but unaware of this)   Cervical / Trunk Assessment Cervical / Trunk Assessment: Normal   Communication Communication Communication: Impaired Factors Affecting Communication: Difficulty expressing self (slurred speech intermittently.)   Cognition Arousal: Lethargic, Suspect due to medications Behavior During Therapy: WFL for tasks assessed/performed Cognition: Cognition impaired     Awareness: Online awareness impaired, Intellectual awareness impaired   Attention impairment (select first level of impairment): Focused attention, Sustained attention, Selective attention, Alternating attention, Divided attention Executive functioning impairment (select all impairments): Initiation, Organization, Sequencing, Reasoning, Problem solving                   Following commands: Impaired Following commands impaired: Follows one step commands inconsistently     Cueing  General Comments    Cueing Techniques: Verbal cues;Tactile cues;Visual cues      Exercises Other Exercises Other Exercises: Edu on role of OT in acute setting.   Shoulder Instructions      Home Living Family/patient expects to be discharged to:: Private residence Living Arrangements: Spouse/significant other Available Help at Discharge: Family;Available 24 hours/day Type of Home: House Home Access: Ramped entrance     Home Layout: One level     Bathroom Shower/Tub: Walk-in shower         Home Equipment: Agricultural consultant (2 wheels);Electric scooter;Shower seat;Cane - single point;Rollator (4 wheels);Shower seat - built in;Grab bars - toilet;Grab bars - tub/shower;BSC/3in1   Additional Comments: Pt reports having a toileting aid.   Pt reports that she has attempted to get a power wheelchair.      Prior Functioning/Environment Prior Level of Function : Needs assist       Physical Assist : Mobility (physical) Mobility (physical): Gait   Mobility Comments: ambulate short distances using RW; also has scooter but likely unable to use without BLEs ADLs Comments: has needed assistance for a couple of years. Assistance  is mostly provided by her husband who is in his 2s and has cancer.    OT Problem List: Decreased strength;Decreased activity tolerance;Decreased safety awareness;Impaired balance (sitting and/or standing)   OT Treatment/Interventions: Self-care/ADL training;Therapeutic exercise;Therapeutic activities;Energy conservation;Patient/family education;DME and/or AE instruction;Balance training      OT Goals(Current goals can be found in the care plan section)   Acute Rehab OT Goals Patient Stated Goal: improve strength and function OT Goal Formulation: With patient/family Time For Goal Achievement: 03/23/24 Potential to Achieve Goals: Fair ADL Goals Pt Will Perform Lower Body Dressing: with min assist;sitting/lateral leans Pt Will Transfer to Toilet: with min assist;with transfer  board;bedside commode Pt Will Perform Toileting - Clothing Manipulation and hygiene: sitting/lateral leans;with contact guard assist   OT Frequency:  Min 2X/week    Co-evaluation PT/OT/SLP Co-Evaluation/Treatment: Yes Reason for Co-Treatment: For patient/therapist safety;To address functional/ADL transfers PT goals addressed during session: Mobility/safety with mobility OT goals addressed during session: ADL's and self-care      AM-PAC OT 6 Clicks Daily Activity     Outcome Measure Help from another person eating meals?: None Help from another person taking care of personal grooming?: A Little Help from another person toileting, which includes using toliet, bedpan, or urinal?: A Lot Help from another person bathing (including washing, rinsing, drying)?: A Lot Help from another person to put on and taking off regular upper body clothing?: A Little Help from another person to put on and taking off regular lower body clothing?: A Lot 6 Click Score: 16   End of Session Equipment Utilized During Treatment: Gait belt;Left knee immobilizer  Activity Tolerance: Patient tolerated treatment well Patient left: in bed;with call bell/phone within reach;with bed alarm set;with family/visitor present  OT Visit Diagnosis: Unsteadiness on feet (R26.81);Muscle weakness (generalized) (M62.81);Other abnormalities of gait and mobility (R26.89)                Time: 8596-8567 OT Time Calculation (min): 29 min Charges:  OT General Charges $OT Visit: 1 Visit OT Evaluation $OT Re-eval: 1 Re-eval Murtaza Shell, OTR/L 03/09/24, 4:09 PM  Mycal Conde E Evelyn Aguinaldo 03/09/2024, 4:03 PM

## 2024-03-10 ENCOUNTER — Ambulatory Visit: Admitting: Physician Assistant

## 2024-03-10 ENCOUNTER — Inpatient Hospital Stay

## 2024-03-10 DIAGNOSIS — D631 Anemia in chronic kidney disease: Secondary | ICD-10-CM | POA: Diagnosis not present

## 2024-03-10 DIAGNOSIS — L03113 Cellulitis of right upper limb: Secondary | ICD-10-CM | POA: Diagnosis not present

## 2024-03-10 DIAGNOSIS — I5032 Chronic diastolic (congestive) heart failure: Secondary | ICD-10-CM | POA: Diagnosis not present

## 2024-03-10 DIAGNOSIS — N183 Chronic kidney disease, stage 3 unspecified: Secondary | ICD-10-CM | POA: Diagnosis not present

## 2024-03-10 DIAGNOSIS — K760 Fatty (change of) liver, not elsewhere classified: Secondary | ICD-10-CM | POA: Diagnosis not present

## 2024-03-10 DIAGNOSIS — F411 Generalized anxiety disorder: Secondary | ICD-10-CM | POA: Diagnosis not present

## 2024-03-10 DIAGNOSIS — L03116 Cellulitis of left lower limb: Secondary | ICD-10-CM | POA: Diagnosis not present

## 2024-03-10 DIAGNOSIS — N3946 Mixed incontinence: Secondary | ICD-10-CM

## 2024-03-10 DIAGNOSIS — E1122 Type 2 diabetes mellitus with diabetic chronic kidney disease: Secondary | ICD-10-CM | POA: Diagnosis not present

## 2024-03-10 DIAGNOSIS — E11610 Type 2 diabetes mellitus with diabetic neuropathic arthropathy: Secondary | ICD-10-CM | POA: Diagnosis not present

## 2024-03-10 DIAGNOSIS — I110 Hypertensive heart disease with heart failure: Secondary | ICD-10-CM | POA: Diagnosis not present

## 2024-03-10 DIAGNOSIS — F330 Major depressive disorder, recurrent, mild: Secondary | ICD-10-CM | POA: Diagnosis not present

## 2024-03-10 DIAGNOSIS — M10072 Idiopathic gout, left ankle and foot: Secondary | ICD-10-CM | POA: Diagnosis not present

## 2024-03-10 LAB — GLUCOSE, CAPILLARY
Glucose-Capillary: 139 mg/dL — ABNORMAL HIGH (ref 70–99)
Glucose-Capillary: 190 mg/dL — ABNORMAL HIGH (ref 70–99)
Glucose-Capillary: 165 mg/dL — ABNORMAL HIGH (ref 70–99)
Glucose-Capillary: 152 mg/dL — ABNORMAL HIGH (ref 70–99)

## 2024-03-10 LAB — URINALYSIS, COMPLETE (UACMP) WITH MICROSCOPIC
Bilirubin Urine: NEGATIVE
Glucose, UA: >=500 mg/dL — AB
Ketones, ur: 5 mg/dL — AB
Leukocytes,Ua: NEGATIVE
Nitrite: NEGATIVE
Protein, ur: 30 mg/dL — AB
Specific Gravity, Urine: 1.012 (ref 1.005–1.030)
pH: 5 (ref 5.0–8.0)

## 2024-03-10 LAB — BASIC METABOLIC PANEL WITH GFR
Anion gap: 11 (ref 5–15)
BUN: 30 mg/dL — ABNORMAL HIGH (ref 8–23)
CO2: 22 mmol/L (ref 22–32)
Calcium: 8.7 mg/dL — ABNORMAL LOW (ref 8.9–10.3)
Chloride: 103 mmol/L (ref 98–111)
Creatinine, Ser: 1.54 mg/dL — ABNORMAL HIGH (ref 0.44–1.00)
GFR, Estimated: 34 mL/min — ABNORMAL LOW (ref >60)
Glucose, Bld: 115 mg/dL — ABNORMAL HIGH (ref 70–99)
Potassium: 3.9 mmol/L (ref 3.5–5.1)
Sodium: 136 mmol/L (ref 135–145)

## 2024-03-10 LAB — CBC
HCT: 28.5 % — ABNORMAL LOW (ref 36.0–46.0)
Hemoglobin: 9 g/dL — ABNORMAL LOW (ref 12.0–15.0)
MCH: 28.7 pg (ref 26.0–34.0)
MCHC: 31.6 g/dL (ref 30.0–36.0)
MCV: 90.8 fL (ref 80.0–100.0)
Platelets: 408 K/uL — ABNORMAL HIGH (ref 150–400)
RBC: 3.14 MIL/uL — ABNORMAL LOW (ref 3.87–5.11)
RDW: 15.7 % — ABNORMAL HIGH (ref 11.5–15.5)
WBC: 15.1 K/uL — ABNORMAL HIGH (ref 4.0–10.5)
nRBC: 0 % (ref 0.0–0.2)

## 2024-03-10 MED ORDER — PIPERACILLIN-TAZOBACTAM 3.375 G IVPB
3.3750 g | Freq: Three times a day (TID) | INTRAVENOUS | Status: DC
  Administered 2024-03-10 – 2024-03-13 (×9): 3.375 g via INTRAVENOUS
  Filled 2024-03-10 (×9): qty 50

## 2024-03-10 MED ORDER — COLCHICINE 0.6 MG PO TABS
1.2000 mg | ORAL_TABLET | Freq: Once | ORAL | Status: AC
  Administered 2024-03-10: 1.2 mg via ORAL
  Filled 2024-03-10: qty 2

## 2024-03-10 MED ORDER — ACETAMINOPHEN 325 MG PO TABS
650.0000 mg | ORAL_TABLET | Freq: Four times a day (QID) | ORAL | Status: DC | PRN
  Administered 2024-03-11: 650 mg via ORAL
  Filled 2024-03-10: qty 2

## 2024-03-10 MED ORDER — LACTATED RINGERS IV SOLN: INTRAVENOUS | Status: DC

## 2024-03-10 MED ORDER — PREGABALIN 25 MG PO CAPS
25.0000 mg | ORAL_CAPSULE | Freq: Every day | ORAL | Status: DC
  Administered 2024-03-11 – 2024-03-15 (×5): 25 mg via ORAL
  Filled 2024-03-10 (×5): qty 1

## 2024-03-10 MED ORDER — COLCHICINE 0.6 MG PO TABS
0.6000 mg | ORAL_TABLET | Freq: Every day | ORAL | Status: DC
  Administered 2024-03-10 – 2024-03-16 (×7): 0.6 mg via ORAL
  Filled 2024-03-10 (×7): qty 1

## 2024-03-10 NOTE — Progress Notes (Signed)
 Patient confused. Refusing care. Not trusting care

## 2024-03-10 NOTE — Progress Notes (Signed)
 [facility]  ROUNDING NOTE   Subjective:   Denise Sharp is a 78 y.o. female with past medical conditions including CAD, hyperlipidemia, hypertension, and chronic kidney disease IIIb. She presents to the ED for evaluation for wound infection and has been admitted for Cellulitis of left foot [L03.116]  Patient is known to our practice and is followed by Dr Dominica. Patient was last seen in office on 01/07/24 for routine follow    Patient seen laying in bed Remains alert and oriented to self only Very suspicious today  Creatinine 1.54   Objective:  Vital signs in last 24 hours:  Temp:  [98.4 F (36.9 C)-99.1 F (37.3 C)] 98.4 F (36.9 C) (08/26 0742) Pulse Rate:  [78-89] 85 (08/26 0742) Resp:  [14-20] 18 (08/26 0742) BP: (108-126)/(37-59) 108/37 (08/26 0742) SpO2:  [95 %-100 %] 95 % (08/26 0742) FiO2 (%):  [21 %] 21 % (08/25 2014)  Weight change:  Filed Weights   03/03/24 1826 03/08/24 0825  Weight: 110.3 kg 112.9 kg    Intake/Output: I/O last 3 completed shifts: In: 180 [P.O.:180] Out: 2150 [Urine:2150]   Intake/Output this shift:  No intake/output data recorded.  Physical Exam: General: NAD  Head: Normocephalic, atraumatic. Moist oral mucosal membranes  Eyes: Anicteric  Lungs:  Clear to auscultation, room air  Heart: Regular rate and rhythm  Abdomen:  Soft, nontender  Extremities:  No peripheral edema.Lt BKA  Neurologic: Awake, alert to self, conversant  Skin: Warm,dry, no rash       Basic Metabolic Panel: Recent Labs  Lab 03/05/24 0432 03/06/24 0950 03/08/24 0549 03/09/24 0422 03/10/24 0440  NA 137 134* 137 135 136  K 4.0 4.0 4.2 3.7 3.9  CL 106 105 104 102 103  CO2 23 20* 24 22 22   GLUCOSE 94 154* 100* 93 115*  BUN 21 21 29* 33* 30*  CREATININE 1.22* 1.21* 1.57* 1.88* 1.54*  CALCIUM  7.9* 8.4* 8.2* 8.5* 8.7*    Liver Function Tests: Recent Labs  Lab 03/03/24 1452  AST 18  ALT 14  ALKPHOS 165*  BILITOT 0.7  PROT 6.9   ALBUMIN  2.9*   No results for input(s): LIPASE, AMYLASE in the last 168 hours. No results for input(s): AMMONIA in the last 168 hours.  CBC: Recent Labs  Lab 03/03/24 1452 03/04/24 0548 03/05/24 0432 03/06/24 0950 03/08/24 0549 03/09/24 0422 03/10/24 0440  WBC 15.0*   < > 12.0* 12.7* 13.8* 15.5* 15.1*  NEUTROABS 10.8*  --   --   --   --   --   --   HGB 12.5   < > 10.0* 9.5* 8.8* 8.4* 9.0*  HCT 40.7   < > 32.2* 30.4* 27.5* 25.9* 28.5*  MCV 93.8   < > 92.0 91.0 90.5 89.9 90.8  PLT 421*   < > 346 339 347 348 408*   < > = values in this interval not displayed.    Cardiac Enzymes: No results for input(s): CKTOTAL, CKMB, CKMBINDEX, TROPONINI in the last 168 hours.  BNP: Invalid input(s): POCBNP  CBG: Recent Labs  Lab 03/09/24 0836 03/09/24 1212 03/09/24 1629 03/09/24 1952 03/10/24 0741  GLUCAP 92 95 117* 128* 139*    Microbiology: Results for orders placed or performed during the hospital encounter of 03/03/24  Blood culture (routine x 2)     Status: Abnormal   Collection Time: 03/03/24  5:01 PM   Specimen: BLOOD RIGHT FOREARM  Result Value Ref Range Status   Specimen Description  Final    BLOOD RIGHT FOREARM Performed at [facility], [address], [place], [place] [address]    Special Requests   Final    BOTTLES DRAWN AEROBIC AND ANAEROBIC Blood Culture results may not be optimal due to an inadequate volume of blood received in culture bottles Performed at [facility], [address] [place], [place] [address]    Culture  Setup Time   Final    GRAM POSITIVE COCCI ANAEROBIC BOTTLE ONLY CRITICAL RESULT CALLED TO, READ BACK BY AND VERIFIED WITH: TIFFANY GILCHRIST, PHARMD AT 1010 ON 03/04/24 GM    Culture (A)  Final    STAPHYLOCOCCUS HOMINIS THE SIGNIFICANCE OF ISOLATING THIS ORGANISM FROM A SINGLE SET OF BLOOD CULTURES WHEN MULTIPLE SETS ARE DRAWN IS UNCERTAIN. PLEASE NOTIFY THE MICROBIOLOGY DEPARTMENT WITHIN ONE WEEK IF  SPECIATION AND SENSITIVITIES ARE REQUIRED. Performed at [facility], [address], [place], [place] [address]    Report Status 03/06/2024 FINAL  Final  Blood Culture ID Panel (Reflexed)     Status: Abnormal   Collection Time: 03/03/24  5:01 PM  Result Value Ref Range Status   Enterococcus faecalis NOT DETECTED NOT DETECTED Final   Enterococcus Faecium NOT DETECTED NOT DETECTED Final   Listeria monocytogenes NOT DETECTED NOT DETECTED Final   Staphylococcus species DETECTED (A) NOT DETECTED Final    Comment: CRITICAL RESULT CALLED TO, READ BACK BY AND VERIFIED WITH: TIFFANY GILCHRIST, PHARMD AT 1010 ON 03/04/24 GM    Staphylococcus aureus (BCID) NOT DETECTED NOT DETECTED Final   Staphylococcus epidermidis NOT DETECTED NOT DETECTED Final   Staphylococcus lugdunensis NOT DETECTED NOT DETECTED Final   Streptococcus species NOT DETECTED NOT DETECTED Final   Streptococcus agalactiae NOT DETECTED NOT DETECTED Final   Streptococcus pneumoniae NOT DETECTED NOT DETECTED Final   Streptococcus pyogenes NOT DETECTED NOT DETECTED Final   A.calcoaceticus-baumannii NOT DETECTED NOT DETECTED Final   Bacteroides fragilis NOT DETECTED NOT DETECTED Final   Enterobacterales NOT DETECTED NOT DETECTED Final   Enterobacter cloacae complex NOT DETECTED NOT DETECTED Final   Escherichia coli NOT DETECTED NOT DETECTED Final   Klebsiella aerogenes NOT DETECTED NOT DETECTED Final   Klebsiella oxytoca NOT DETECTED NOT DETECTED Final   Klebsiella pneumoniae NOT DETECTED NOT DETECTED Final   Proteus species NOT DETECTED NOT DETECTED Final   Salmonella species NOT DETECTED NOT DETECTED Final   Serratia marcescens NOT DETECTED NOT DETECTED Final   Haemophilus influenzae NOT DETECTED NOT DETECTED Final   Neisseria meningitidis NOT DETECTED NOT DETECTED Final   Pseudomonas aeruginosa NOT DETECTED NOT DETECTED Final   Stenotrophomonas maltophilia NOT DETECTED NOT DETECTED Final   Candida albicans NOT DETECTED  NOT DETECTED Final   Candida auris NOT DETECTED NOT DETECTED Final   Candida glabrata NOT DETECTED NOT DETECTED Final   Candida krusei NOT DETECTED NOT DETECTED Final   Candida parapsilosis NOT DETECTED NOT DETECTED Final   Candida tropicalis NOT DETECTED NOT DETECTED Final   Cryptococcus neoformans/gattii NOT DETECTED NOT DETECTED Final    Comment: Performed at [facility], [address] [place], [place] [address]  Blood culture (routine x 2)     Status: None   Collection Time: 03/03/24  7:01 PM   Specimen: BLOOD  Result Value Ref Range Status   Specimen Description BLOOD BLOOD RIGHT HAND  Final   Special Requests   Final    BOTTLES DRAWN AEROBIC AND ANAEROBIC Blood Culture results may not be optimal due to an  inadequate volume of blood received in culture bottles   Culture   Final    NO GROWTH 5 DAYS Performed at [facility], [address] [place], [place] [address]    Report Status 03/08/2024 FINAL  Final   *Note: Due to a large number of results and/or encounters for the requested time period, some results have not been displayed. A complete set of results can be found in Results Review.    Coagulation Studies: No results for input(s): LABPROT, INR in the last 72 hours.  Urinalysis: No results for input(s): COLORURINE, LABSPEC, PHURINE, GLUCOSEU, HGBUR, BILIRUBINUR, KETONESUR, PROTEINUR, UROBILINOGEN, NITRITE, LEUKOCYTESUR in the last 72 hours.  Invalid input(s): APPERANCEUR    Imaging: CT HEAD WO CONTRAST ( ) Result Date: 03/08/2024 CLINICAL DATA:  Delirium EXAM: CT HEAD WITHOUT CONTRAST TECHNIQUE: Contiguous axial images were obtained from the base of the skull through the vertex without intravenous contrast. RADIATION DOSE REDUCTION: This exam was performed according to the departmental dose-optimization program which includes automated exposure control, adjustment of the mA and/or kV according to patient size and/or  use of iterative reconstruction technique. COMPARISON:  CT head 02/08/2021.  MRI head 03/01/2022. FINDINGS: Brain: No evidence of acute infarction, hemorrhage, hydrocephalus, extra-axial collection or mass lesion/mass effect. Moderate to advanced patchy white matter hypodensities are nonspecific but compatible with chronic microvascular ischemic disease. Vascular: Calcific atherosclerosis. Skull: No acute fracture. Sinuses/Orbits: Clear sinuses.  No acute orbital findings. Other: No mastoid effusions. IMPRESSION: 1. No evidence of acute intracranial abnormality. 2. Moderate to advanced chronic microvascular ischemic disease. Electronically Signed   By: Gilmore GORMAN Molt M.D.   On: 03/08/2024 22:36     Medications:    lactated ringers  50 mL/hr at 03/10/24 1104    allopurinol   100 mg Oral Daily   apixaban   5 mg Oral BID   atorvastatin   80 mg Oral QHS   cephALEXin   500 mg Oral Q6H   dapagliflozin  propanediol  10 mg Oral Daily   docusate sodium   100 mg Oral Daily   DULoxetine   60 mg Oral Daily   ezetimibe   10 mg Oral Daily   Finerenone   10 mg Oral q AM   fluticasone  furoate-vilanterol  1 puff Inhalation Daily   insulin  aspart  0-15 Units Subcutaneous TID WC   insulin  aspart  0-5 Units Subcutaneous QHS   pantoprazole   40 mg Oral Daily   pregabalin   50 mg Oral BID   fluticasone , lidocaine , morphine  injection, nitroGLYCERIN , nystatin , polyethylene glycol, senna-docusate  Assessment/ Plan:  Denise Sharp is a 78 y.o.  female with past medical conditions including CAD, hyperlipidemia, hypertension, and chronic kidney disease IIIb. She presents to the ED for evaluation for wound infection and has been admitted for Cellulitis of left foot [L03.116]   Acute Kidney Injury on chronic kidney disease stage IIIb with baseline creatinine 1.2 and GFR of 45.  Acute kidney injury secondary to infection and hypotension.  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   Renal function has improved with IV hydration. Poor oral intake, will continue IVF for now. Will continue to monitor.   Lab Results  Component Value Date   CREATININE 1.54 (H) 03/10/2024   CREATININE 1.88 (H) 03/09/2024   CREATININE 1.57 (H) 03/08/2024    Intake/Output Summary (Last 24 hours) at 03/10/2024 1148 Last data filed at 03/10/2024 0514 Gross per 24 hour  Intake 180 ml  Output 1100 ml  Net -920  ml    2. Anemia of chronic kidney disease Lab Results  Component Value Date   HGB 9.0 (L) 03/10/2024    Hgb has improved. Will monitor for need of ESA.   3. Secondary Hyperparathyroidism: with outpatient labs: PTH 149, phosphorus 4.5, calcium  8.9 on 09/30/23.   Lab Results  Component Value Date   CALCIUM  8.7 (L) 03/10/2024   CAION 1.17 06/04/2022    Calcium  stable. Will continue to monitor.   4. Cellulitis, left foot wound with possible osteomyelitis. 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 Remains on oral Cephalexin .     LOS: 7 [place] Hildebrant 8/26/202511:48 AM

## 2024-03-10 NOTE — Plan of Care (Signed)
 The patient is s/p day 3 post op for Left BKA. The dressing is intact with minimal drainage noted. The patient has been given PRN Morphine  2mg  IV for c/o L BKA pain 8/10 with effective relief. This Clinical research associate gave the patient PRN Miralax  and PRN Senna for no BM since 8/21.

## 2024-03-10 NOTE — Progress Notes (Signed)
 Progress Note    03/10/2024 9:37 AM 3 Days Post-Op  Subjective:  Denise Sharp is a 78 yo female now POD #3 from Left Below the knee amputation.  Patient is recovering as expected.  Patient remains confused this morning postoperatively.  Patient noted to get IV pain medication overnight.  Husband is at the bedside.   Vitals:   03/10/24 0502 03/10/24 0742  BP: (!) 110/47 (!) 108/37  Pulse: 89 85  Resp: 16 18  Temp: 98.4 F (36.9 C) 98.4 F (36.9 C)  SpO2: 100% 95%   Physical Exam: Cardiac:  RRR, normal S1 and S2.  No murmurs. Lungs: Nonlabored breathing, clear throughout on auscultation but diminished in the bases.  Without rales rhonchi or wheezing. Incisions: Left lower extremity below the knee amputation.  Dressing clean dry and intact. Extremities: Right lower extremity is warm to touch with palpable pulses.  Left lower extremity with new below the knee amputation.  Dressings clean dry and intact. Abdomen: Positive bowel sounds throughout, soft, nontender nondistended. Neurologic: Patient remains very confused today.  Unable to answer any of my questions or follow commands.  CBC    Component Value Date/Time   WBC 15.1 (H) 03/10/2024 0440   RBC 3.14 (L) 03/10/2024 0440   HGB 9.0 (L) 03/10/2024 0440   HGB 11.2 (L) 11/28/2023 1307   HGB 13.1 06/21/2014 1349   HCT 28.5 (L) 03/10/2024 0440   HCT 41.4 06/21/2014 1349   PLT 408 (H) 03/10/2024 0440   PLT 396 11/28/2023 1307   PLT 308 06/21/2014 1349   MCV 90.8 03/10/2024 0440   MCV 88 06/21/2014 1349   MCH 28.7 03/10/2024 0440   MCHC 31.6 03/10/2024 0440   RDW 15.7 (H) 03/10/2024 0440   RDW 15.4 (H) 06/21/2014 1349   LYMPHSABS 2.8 03/03/2024 1452   LYMPHSABS 2.3 02/25/2014 2153   MONOABS 0.9 03/03/2024 1452   MONOABS 0.6 02/25/2014 2153   EOSABS 0.1 03/03/2024 1452   EOSABS 0.1 02/25/2014 2153   BASOSABS 0.2 (H) 03/03/2024 1452   BASOSABS 0.1 02/25/2014 2153    BMET    Component Value Date/Time   NA 136  03/10/2024 0440   NA 137 10/21/2023 1417   NA 141 06/21/2014 1349   K 3.9 03/10/2024 0440   K 4.0 06/21/2014 1349   CL 103 03/10/2024 0440   CL 106 06/21/2014 1349   CO2 22 03/10/2024 0440   CO2 24 06/21/2014 1349   GLUCOSE 115 (H) 03/10/2024 0440   GLUCOSE 174 (H) 06/21/2014 1349   BUN 30 (H) 03/10/2024 0440   BUN 30 (H) 10/21/2023 1417   BUN 12 06/21/2014 1349   CREATININE 1.54 (H) 03/10/2024 0440   CREATININE 1.30 (H) 01/24/2024 1009   CALCIUM  8.7 (L) 03/10/2024 0440   CALCIUM  8.7 06/21/2014 1349   GFRNONAA 34 (L) 03/10/2024 0440   GFRNONAA 41 (L) 11/28/2023 1308   GFRNONAA 57 (L) 06/21/2014 1349   GFRNONAA 55 (L) 02/25/2014 2153   GFRNONAA 70 12/13/2011 0919   GFRAA >60 03/23/2020 1204   GFRAA >60 06/21/2014 1349   GFRAA >60 02/25/2014 2153   GFRAA 80 12/13/2011 0919    INR    Component Value Date/Time   INR 1.6 (H) 02/01/2024 0414     Intake/Output Summary (Last 24 hours) at 03/10/2024 0937 Last data filed at 03/10/2024 0514 Gross per 24 hour  Intake 180 ml  Output 1100 ml  Net -920 ml     Assessment/Plan:  78 y.o. female is s/p  left below the knee amputation.  3 Days Post-Op   Plan  Vascular surgery changed patient's first dressing today at the bedside.  There is no complications to note.  Staples are dry and intact.  No hematoma, seroma, infection or drainage to note.  New dressing was placed today.  Orders for daily dressing changes by nursing placed as well. Okay for patient to discharge to rehab once medically stable and recovered.  DVT prophylaxis: Eliquis  5 mg twice daily.   Denise Sharp [facility] 03/10/2024 9:37 AM

## 2024-03-10 NOTE — Progress Notes (Addendum)
 PROGRESS NOTE    Denise Sharp  FMW:[id number] DOB: May 03, 1946 DOA: 03/03/2024 PCP: Marylynn Verneita CROME, MD  Chief Complaint  Denise Sharp presents with   Wound Infection    Hospital Course:  Denise Sharp is a 78 year old with hypertension, hyperlipidemia, CAD, anxiety, asthma, CKD, who presents from urology for chief concern of worsening wounds of the left foot, reports low grade fever at home, chills and increasing pain with discharge of her left foot. Progressive charcoaled arthropathy vs superimposed OM as per XR. MRI with worsening Charot joint and possible OM, Podiatry recommend Left BKA which Denise Sharp underwent on 08/23 by Vascular Surgery Hospital course as below   Subjective: Denise Sharp was examined at the bedside. Denise Sharp still confused, having auditory hallucinations. Will repeat send infectious work up as she had recent UTI.  Was given IV Morphine  x 2 doses overnight - discontinued Discussed updates with spouse over the phone     Objective: Vitals:   03/09/24 1635 03/09/24 1948 03/10/24 0502 03/10/24 0742  BP: (!) 126/58 (!) 114/59 (!) 110/47 (!) 108/37  Pulse: 83 78 89 85  Resp: 20 14 16 18   Temp: 98.8 F (37.1 C) 99.1 F (37.3 C) 98.4 F (36.9 C) 98.4 F (36.9 C)  TempSrc:      SpO2: 97% 98% 100% 95%  Weight:      Height:        Intake/Output Summary (Last 24 hours) at 03/10/2024 1509 Last data filed at 03/10/2024 1100 Gross per 24 hour  Intake 0 ml  Output 1700 ml  Net -1700 ml   Filed Weights   03/03/24 1826 03/08/24 0825  Weight: 110.3 kg 112.9 kg    Examination: Constitutional: appears age-appropriate, NAD, confused Neck: normal, supple, no masses, no thyromegaly Respiratory: clear to auscultation bilaterally, no wheezing, no crackles. Normal respiratory effort. No accessory muscle use.  Cardiovascular: Regular rate and rhythm, no murmurs / rubs / gallops. No extremity edema. 2+ pedal pulses. No carotid bruits.  Abdomen: Morbidly obese abdomen,  no tenderness, no masses palpated, no hepatosplenomegaly. Bowel sounds positive.  Musculoskeletal: no clubbing / cyanosis. L BKA surgical dressing Skin: + left lateral foot/ankle lesion with increased erythema and edema Neurologic: Sensation intact. 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 Met SIRS criteria on adm (fever, leukocytosis, lactic acidosis) - Bcx 1/4: GPC (Staph species), likely contaminant - discussed with ID curbside - XR Left foot advanced osseous destructive changes and fragmentation of the midfoot, consistent with Charcot arthropathy.  Suspect progressive erosive change and  osseous destruction of calcaneus, talus, cuboid navicular and cuneiform bones.  Increased effusion and soft tissue swelling.  Progressive charcoaled arthropathy vs superimposed OM - MRI Left foot shows severe Charcot changes, findings concerning for early OM vs reactive changes throughout talus. No evidence of abscess - Seen by Podiatry, recommend Left BKA - Seen by  Vascular surgery, s/p Left BKA 08/23, pathology sent - Resume Eliquis   LUE cellulitis - improving - abx as below - also has joint swelling and tenderness with possible gout - trial of colchicine  (avoid NSAID's with AKI, avoid prednisone  with possible infection and AMS)  Acute toxic/metabolic encephalopathy on 08/24 - Suspect multifactorial due to medication (opioids, hydroxyzine , zanaflex ), hospital acquired deirium, AKI on CKD - No deficits on exam, Hx TIA as per daughter, CT Head 08/24 negative for acute changes - Discontinue Zanaflex , Hydroxyzine , Opioids, Cymbalta . Continue Lyrica  (discontinued IV Morphine  given overnight) - Repeat UA, CXR, Blood cultures sent (Denise Sharp has recent UTI prior to adm, also leukocytosis) - On IV Zosyn  empirically - Monitor mental status  Left knee pain s/p Total left knee arthroplasty - resolved - XR No acute fracture or dislocation - Pain management  AKI on CKD stage 3a baseline appears 1.1-1.3, Suspect AKI 2/2 infectious etiology and hypotension on admission Cr inc 1.88 -> 1.54, Hold torsemide  IV fluids 50cc/hr due to poor po intake Seen by nephrology, appreciate recs Monitor Cr  Hypotension Hx HTN Reports having BP discrepancy, higher in LE > UE chronically low SBP in 90's S/p IV Albumin , avoid IV fluids due to hx CHF Monitor BP   Persistent atrial fibrillation (HCC) - Eliquis  held for Left BKA  Normocytic anemia Iron  deficiency anemia Denise Sharp reports that she gets iron  infusion Hb 9.0 Outpatient follow-up as above.  NICM with preserved ejection fraction - On torsemide  daily - held   Type II diabetes mellitus with neurological manifestations (HCC) Home long acting insulin , hold Lantus  25u - poor po intake due to AMS and AKI Insulin  SSI with HS coverage Goal inpatient blood glucose level 140-180   Chronic anxiety Home duloxetine  60 mg daily resumed   Hyperlipidemia Home atorvastatin  80 mg nightly resumed   Major depressive  disorder with current active episode Home duloxetine  60 mg daily resumed   Morbid obesity This complicates overall care and prognosis.    History of gout Allopurinol  100 mg daily, colchicine  prn only   GAD (generalized anxiety disorder) PDMP reviewed  Obesity hypoventilation syndrome (HCC) Cpap at bedtime ordered   Weakness Fall precaution PT/OT rec SNF when ready  DVT prophylaxis: Eliquis    Code Status: Full Code Disposition:  SNF when ready  Consultants:  Podiatry Vascular Surgery Nephrology  Procedures:  Left BKA  Antimicrobials:  Anti-infectives (From admission, onward)    Start     Dose/Rate Route Frequency Ordered Stop   03/10/24 1430  piperacillin -tazobactam (ZOSYN ) IVPB 3.375 g        3.375 g 12.5 mL/hr over 240 Minutes Intravenous Every 8 hours 03/10/24 1337     03/08/24 1800  cephALEXin  (KEFLEX ) capsule 500 mg  Status:  Discontinued        500 mg Oral Every 6 hours 03/08/24 1419 03/10/24 1337   03/06/24 0500  ceFEPIme  (MAXIPIME ) 2 g in sodium chloride  0.9 % 100 mL IVPB  Status:  Discontinued        2 g 200 mL/hr over 30 Minutes Intravenous Every 12 hours 03/05/24 2309 03/08/24 1419   03/05/24 1700  vancomycin  (VANCOCIN ) IVPB 1000 mg/200 mL premix  Status:  Discontinued        1,000 mg 200 mL/hr over 60 Minutes Intravenous Every 24 hours 03/05/24 0813 03/08/24 0828   03/05/24 1200  ceFEPIme  (MAXIPIME ) 2 g in sodium chloride  0.9 % 100 mL IVPB  Status:  Discontinued        2 g 200 mL/hr over 30 Minutes Intravenous 2 times daily 03/05/24 1128 03/05/24 2309   03/04/24 1700  vancomycin  (VANCOREADY) IVPB 1000 mg/200 mL  Status:  Discontinued        1,000 mg 200 mL/hr over 60 Minutes Intravenous Every 24 hours 03/03/24 1738 03/05/24 0813   03/03/24 1730  vancomycin  (VANCOREADY) IVPB 2000 mg/400 mL        2,000 mg 200 mL/hr over 120 Minutes Intravenous  Once 03/03/24 1729 03/04/24 0534   03/03/24 1645  vancomycin  (VANCOCIN ) IVPB 1000 mg/200 mL premix  Status:   Discontinued        1,000 mg 200 mL/hr over 60 Minutes Intravenous  Once 03/03/24 1635 03/03/24 1729       Data Reviewed: I have personally reviewed following labs and imaging studies CBC: Recent Labs  Lab 03/05/24 0432 03/06/24 0950 03/08/24 0549 03/09/24 0422 03/10/24 0440  WBC 12.0* 12.7* 13.8* 15.5* 15.1*  HGB 10.0* 9.5* 8.8* 8.4* 9.0*  HCT 32.2* 30.4* 27.5* 25.9* 28.5*  MCV 92.0 91.0 90.5 89.9 90.8  PLT 346 339 347 348 408*   Basic Metabolic Panel: Recent Labs  Lab 03/05/24 0432 03/06/24 0950 03/08/24 0549 03/09/24 0422 03/10/24 0440  NA 137 134* 137 135 136  K 4.0 4.0 4.2 3.7 3.9  CL 106 105 104 102 103  CO2 23 20* 24 22 22   GLUCOSE 94 154* 100* 93 115*  BUN 21 21 29* 33* 30*  CREATININE 1.22* 1.21* 1.57* 1.88* 1.54*  CALCIUM  7.9* 8.4* 8.2* 8.5* 8.7*   GFR: Estimated Creatinine Clearance: 37.1 mL/min (A) (by C-G formula based on SCr of 1.54 mg/dL (H)). Liver Function Tests: No results for input(s): AST, ALT, ALKPHOS, BILITOT, PROT, ALBUMIN  in the last 168 hours.  CBG: Recent Labs  Lab 03/09/24 1212 03/09/24 1629 03/09/24 1952 03/10/24 0741 03/10/24 1148  GLUCAP 95 117* 128* 139* 190*    Recent Results (from the past 240 hours)  Blood culture (routine x 2)     Status: Abnormal   Collection Time: 03/03/24  5:01 PM   Specimen: BLOOD RIGHT FOREARM  Result Value Ref Range Status   Specimen Description   Final    BLOOD RIGHT FOREARM Performed at [facility], [address] [place], [place] [address]    Special Requests   Final    BOTTLES DRAWN AEROBIC AND ANAEROBIC Blood Culture results may not be optimal due to an inadequate volume of blood received in culture bottles Performed at [facility], [address] [place], [place] [address]    Culture  Setup Time   Final    GRAM POSITIVE COCCI ANAEROBIC BOTTLE ONLY CRITICAL RESULT CALLED TO, READ BACK BY AND VERIFIED WITH: TIFFANY GILCHRIST, PHARMD AT 1010 ON 03/04/24  GM    Culture (A)  Final    STAPHYLOCOCCUS HOMINIS THE SIGNIFICANCE OF ISOLATING THIS ORGANISM FROM A SINGLE SET OF BLOOD CULTURES WHEN MULTIPLE SETS ARE DRAWN IS UNCERTAIN. PLEASE NOTIFY THE MICROBIOLOGY DEPARTMENT WITHIN ONE WEEK IF SPECIATION AND SENSITIVITIES ARE REQUIRED. Performed at [facility], [address] [place], [place] [address]    Report Status 03/06/2024 FINAL  Final  Blood Culture  ID Panel (Reflexed)     Status: Abnormal   Collection Time: 03/03/24  5:01 PM  Result Value Ref Range Status   Enterococcus faecalis NOT DETECTED NOT DETECTED Final   Enterococcus Faecium NOT DETECTED NOT DETECTED Final   Listeria monocytogenes NOT DETECTED NOT DETECTED Final   Staphylococcus species DETECTED (A) NOT DETECTED Final    Comment: CRITICAL RESULT CALLED TO, READ BACK BY AND VERIFIED WITH: TIFFANY GILCHRIST, PHARMD AT 1010 ON 03/04/24 GM    Staphylococcus aureus (BCID) NOT DETECTED NOT DETECTED Final   Staphylococcus epidermidis NOT DETECTED NOT DETECTED Final   Staphylococcus lugdunensis NOT DETECTED NOT DETECTED Final   Streptococcus species NOT DETECTED NOT DETECTED Final   Streptococcus agalactiae NOT DETECTED NOT DETECTED Final   Streptococcus pneumoniae NOT DETECTED NOT DETECTED Final   Streptococcus pyogenes NOT DETECTED NOT DETECTED Final   A.calcoaceticus-baumannii NOT DETECTED NOT DETECTED Final   Bacteroides fragilis NOT DETECTED NOT DETECTED Final   Enterobacterales NOT DETECTED NOT DETECTED Final   Enterobacter cloacae complex NOT DETECTED NOT DETECTED Final   Escherichia coli NOT DETECTED NOT DETECTED Final   Klebsiella aerogenes NOT DETECTED NOT DETECTED Final   Klebsiella oxytoca NOT DETECTED NOT DETECTED Final   Klebsiella pneumoniae NOT DETECTED NOT DETECTED Final   Proteus species NOT DETECTED NOT DETECTED Final   Salmonella species NOT DETECTED NOT DETECTED Final   Serratia marcescens NOT DETECTED NOT DETECTED Final   Haemophilus influenzae NOT  DETECTED NOT DETECTED Final   Neisseria meningitidis NOT DETECTED NOT DETECTED Final   Pseudomonas aeruginosa NOT DETECTED NOT DETECTED Final   Stenotrophomonas maltophilia NOT DETECTED NOT DETECTED Final   Candida albicans NOT DETECTED NOT DETECTED Final   Candida auris NOT DETECTED NOT DETECTED Final   Candida glabrata NOT DETECTED NOT DETECTED Final   Candida krusei NOT DETECTED NOT DETECTED Final   Candida parapsilosis NOT DETECTED NOT DETECTED Final   Candida tropicalis NOT DETECTED NOT DETECTED Final   Cryptococcus neoformans/gattii NOT DETECTED NOT DETECTED Final    Comment: Performed at [facility], [address] [place], [place] [address]  Blood culture (routine x 2)     Status: None   Collection Time: 03/03/24  7:01 PM   Specimen: BLOOD  Result Value Ref Range Status   Specimen Description BLOOD BLOOD RIGHT HAND  Final   Special Requests   Final    BOTTLES DRAWN AEROBIC AND ANAEROBIC Blood Culture results may not be optimal due to an inadequate volume of blood received in culture bottles   Culture   Final    NO GROWTH 5 DAYS Performed at [facility], [address] [place], [place] [address]    Report Status 03/08/2024 FINAL  Final     Radiology Studies: No results found.    Scheduled Meds:  allopurinol   100 mg Oral Daily   apixaban   5 mg Oral BID   atorvastatin   80 mg Oral QHS   dapagliflozin  propanediol  10 mg Oral Daily   docusate sodium   100 mg Oral Daily   DULoxetine   60 mg Oral Daily   ezetimibe   10 mg Oral Daily   Finerenone   10 mg Oral q AM   fluticasone  furoate-vilanterol  1 puff Inhalation Daily   insulin  aspart  0-15 Units Subcutaneous TID WC   insulin  aspart  0-5 Units Subcutaneous QHS   pantoprazole   40 mg Oral Daily   pregabalin   50 mg Oral BID   Continuous Infusions:  piperacillin -tazobactam (ZOSYN )  IV  3.375 g (03/10/24 1411)     LOS: 7 days  MDM: Denise Sharp is high risk for one or more organ failure.  They necessitate  ongoing hospitalization for continued IV therapies and subsequent lab monitoring. Total time spent interpreting labs and vitals, reviewing the medical record, coordinating care amongst consultants and care team members, directly assessing and discussing care with the Denise Sharp and/or family: 55 min  Laree Lock, MD [facility]  To contact the attending physician between 7A-7P please use Epic Chat. To contact the covering physician during after hours 7P-7A, please review Amion.  03/10/2024, 3:09 PM   *This document has been created with the assistance of dictation software. Please excuse typographical errors. *

## 2024-03-10 NOTE — Progress Notes (Signed)
 Patient confused this morning upon bedside shift report. Patient started to have suspicion with care with morning medications and by 1100 was hallucinating seeing things on the ceiling, Pace, NP and Dr. Jerelene made aware of concern new orders placed. UA sent. Cxray done, pending blood culture. Husband, Koren at bedside, updated on care plan and pending testing.

## 2024-03-10 NOTE — Plan of Care (Signed)
  Problem: Coping: Goal: Ability to adjust to condition or change in health will improve Outcome: Not Progressing   Problem: Education: Goal: Knowledge of General Education information will improve Description: Including pain rating scale, medication(s)/side effects and non-pharmacologic comfort measures Outcome: Not Progressing

## 2024-03-11 DIAGNOSIS — I96 Gangrene, not elsewhere classified: Secondary | ICD-10-CM | POA: Diagnosis not present

## 2024-03-11 LAB — BASIC METABOLIC PANEL WITH GFR
Anion gap: 13 (ref 5–15)
BUN: 25 mg/dL — ABNORMAL HIGH (ref 8–23)
CO2: 23 mmol/L (ref 22–32)
Calcium: 8.8 mg/dL — ABNORMAL LOW (ref 8.9–10.3)
Chloride: 103 mmol/L (ref 98–111)
Creatinine, Ser: 1.57 mg/dL — ABNORMAL HIGH (ref 0.44–1.00)
GFR, Estimated: 34 mL/min — ABNORMAL LOW (ref >60)
Glucose, Bld: 137 mg/dL — ABNORMAL HIGH (ref 70–99)
Potassium: 4 mmol/L (ref 3.5–5.1)
Sodium: 139 mmol/L (ref 135–145)

## 2024-03-11 LAB — CBC
HCT: 27.6 % — ABNORMAL LOW (ref 36.0–46.0)
Hemoglobin: 8.7 g/dL — ABNORMAL LOW (ref 12.0–15.0)
MCH: 28.4 pg (ref 26.0–34.0)
MCHC: 31.5 g/dL (ref 30.0–36.0)
MCV: 90.2 fL (ref 80.0–100.0)
Platelets: 432 K/uL — ABNORMAL HIGH (ref 150–400)
RBC: 3.06 MIL/uL — ABNORMAL LOW (ref 3.87–5.11)
RDW: 15.5 % (ref 11.5–15.5)
WBC: 15.3 K/uL — ABNORMAL HIGH (ref 4.0–10.5)
nRBC: 0 % (ref 0.0–0.2)

## 2024-03-11 LAB — GLUCOSE, CAPILLARY
Glucose-Capillary: 151 mg/dL — ABNORMAL HIGH (ref 70–99)
Glucose-Capillary: 155 mg/dL — ABNORMAL HIGH (ref 70–99)
Glucose-Capillary: 137 mg/dL — ABNORMAL HIGH (ref 70–99)
Glucose-Capillary: 146 mg/dL — ABNORMAL HIGH (ref 70–99)

## 2024-03-11 LAB — SURGICAL PATHOLOGY

## 2024-03-11 MED ORDER — OXYCODONE HCL 5 MG PO TABS
5.0000 mg | ORAL_TABLET | Freq: Three times a day (TID) | ORAL | Status: AC | PRN
  Administered 2024-03-11 – 2024-03-13 (×3): 5 mg via ORAL
  Filled 2024-03-11 (×3): qty 1

## 2024-03-11 MED ORDER — HYDROMORPHONE HCL 1 MG/ML IJ SOLN: 0.5000 mg | INTRAMUSCULAR | Status: DC | PRN

## 2024-03-11 MED ORDER — LACTATED RINGERS IV SOLN: INTRAVENOUS | Status: DC

## 2024-03-11 MED ORDER — ENSURE PLUS HIGH PROTEIN PO LIQD
237.0000 mL | Freq: Two times a day (BID) | ORAL | Status: DC
  Administered 2024-03-13 – 2024-03-16 (×3): 237 mL via ORAL

## 2024-03-11 MED ORDER — SODIUM CHLORIDE 0.9 % IV SOLN: INTRAVENOUS | Status: AC

## 2024-03-11 NOTE — Progress Notes (Addendum)
 Progress Note    [name]  FMW:[id number] DOB: [birth year]  DOA: 03/03/2024 PCP: Marylynn Verneita CROME, MD      Brief Narrative:    Medical records reviewed and are as summarized below:  Denise Sharp is a 78 y.o. female with hypertension, hyperlipidemia, CAD, anxiety, asthma, CKD, who presents from urology for chief concern of worsening wounds of the left foot, reports low grade fever at home, chills and increasing pain with discharge of her left foot. Progressive charcoaled arthropathy vs superimposed OM as per XR. MRI with worsening Charot joint and possible OM, Podiatry recommend Left BKA which patient underwent on 08/23 by Vascular Surgery      Assessment/Plan:   Principal Problem:   Gangrene of left foot (HCC) Active Problems:   SIRS (systemic inflammatory response syndrome) (HCC)   Type II diabetes mellitus with neurological manifestations (HCC)   OSA on CPAP   Iron  deficiency anemia   Obesity hypoventilation syndrome (HCC)   Coronary artery disease of native artery of native heart with stable angina pectoris (HCC)   Persistent atrial fibrillation (HCC)   Restless legs syndrome   Morbid obesity (HCC)   Hypersomnia, persistent   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 Met SIRS criteria on adm (fever, leukocytosis, lactic acidosis) - Bcx 1/4: GPC (Staph species), likely contaminant  - XR Left foot advanced osseous destructive changes and fragmentation of the midfoot,  consistent with Charcot arthropathy.  Suspect progressive erosive change and osseous destruction of calcaneus, talus, cuboid navicular and cuneiform bones.  Increased effusion and soft tissue swelling.  Progressive charco arthropathy vs superimposed OM - MRI Left foot shows severe Charcot changes, findings concerning for early OM vs reactive changes throughout talus. No evidence of abscess - Seen by Podiatry, recommend Left BKA - Seen by Vascular surgery, s/p Left BKA 08/23, pathology sent    LUE cellulitis/gout involving the left hand (painful swelling, erythema of dorsal aspect of left hand)- improving - On IV Zosyn  - Continue colchicine  and allopurinol     Acute toxic/metabolic encephalopathy on 08/24 Mental status is improving but not back to baseline - Suspect multifactorial due to medication (opioids, hydroxyzine , zanaflex ), hospital acquired deirium, AKI on CKD - No deficits on exam, Hx TIA as per daughter, CT Head 08/24 negative for acute changes -Zanaflex , Hydroxyzine , Opioids, Cymbalta , IV morphine  have been-discontinued. Continue Lyrica  Urinalysis 120/2025 negative for UTI.  (patient has recent UTI prior to adm, also leukocytosis)     Left knee pain s/p Total left knee arthroplasty - resolved - XR No acute fracture or dislocation - Pain management    AKI on CKD stage 3a baseline appears 1.1-1.3,  Creatinine down from 1.88-1.57. Continue IV fluids.  Monitor BMP and follow-up with nephrologist.   Hypotension-improved Hx HTN S/p IV albumin  and IV fluids   Persistent atrial fibrillation (HCC) - Continue Eliquis     NICM with preserved ejection fraction Torsemide  on hold    Type II diabetes mellitus  with neurological manifestations (HCC) Home long acting insulin , hold Lantus  25u - poor po intake due to AMS and AKI Insulin  SSI with HS coverage Goal inpatient blood glucose level 140-180     History of gout Continue colchicine  and allopurinol      Obesity  hypoventilation syndrome (HCC) Cpap at bedtime ordered    Weakness Fall precaution PT/OT rec SNF when ready   Comorbidities include generalized anxiety disorder, major depressive, hyperlipidemia, iron  deficiency anemia (periodic iron  infusion in the outpatient setting)    Plan discussed with Bridgette, daughter, at the bedside   Diet Order             Diet heart healthy/carb modified Room service appropriate? Yes; Fluid consistency: Thin  Diet effective now                                  Consultants: Podiatrist Vascular surgeon  Procedures: Left below-knee amputation on 03/08/2023    Medications:    allopurinol   100 mg Oral Daily   apixaban   5 mg Oral BID   atorvastatin   80 mg Oral QHS   colchicine   0.6 mg Oral Daily   dapagliflozin  propanediol  10 mg Oral Daily   docusate sodium   100 mg Oral Daily   ezetimibe   10 mg Oral Daily   Finerenone   10 mg Oral q AM   fluticasone  furoate-vilanterol  1 puff Inhalation Daily   insulin  aspart  0-15 Units Subcutaneous TID WC   insulin  aspart  0-5 Units Subcutaneous QHS   pantoprazole   40 mg Oral Daily   pregabalin   25 mg Oral QHS   Continuous Infusions:  lactated ringers  50 mL/hr at 03/10/24 1751   piperacillin -tazobactam (ZOSYN )  IV 3.375 g (03/10/24 2226)     Anti-infectives (From admission, onward)    Start     Dose/Rate Route Frequency Ordered Stop   03/10/24 1430  piperacillin -tazobactam (ZOSYN ) IVPB 3.375 g        3.375 g 12.5 mL/hr over 240 Minutes Intravenous Every 8 hours 03/10/24 1337     03/08/24 1800  cephALEXin  (KEFLEX ) capsule 500 mg  Status:  Discontinued        500 mg Oral Every 6 hours 03/08/24 1419 03/10/24 1337   03/06/24 0500  ceFEPIme  (MAXIPIME ) 2 g in sodium chloride  0.9 % 100 mL IVPB  Status:  Discontinued        2 g 200 mL/hr over 30 Minutes Intravenous Every 12 hours 03/05/24 2309 03/08/24 1419   03/05/24 1700  vancomycin  (VANCOCIN ) IVPB 1000 mg/200 mL premix  Status:   Discontinued        1,000 mg 200 mL/hr over 60 Minutes Intravenous Every 24 hours 03/05/24 0813 03/08/24 0828   03/05/24 1200  ceFEPIme  (MAXIPIME ) 2 g in sodium chloride  0.9 % 100 mL IVPB  Status:  Discontinued        2 g 200 mL/hr over 30 Minutes Intravenous 2 times daily 03/05/24 1128 03/05/24 2309   03/04/24 1700  vancomycin  (VANCOREADY) IVPB 1000 mg/200 mL  Status:  Discontinued        1,000 mg 200 mL/hr over 60 Minutes Intravenous Every 24 hours 03/03/24 1738 03/05/24 0813   03/03/24 1730  vancomycin  (VANCOREADY) IVPB 2000 mg/400 mL        2,000 mg 200 mL/hr over 120 Minutes Intravenous  Once 03/03/24 1729 03/04/24 0534   03/03/24 1645  vancomycin  (VANCOCIN ) IVPB 1000 mg/200 mL premix  Status:  Discontinued        1,000 mg 200 mL/hr over 60 Minutes Intravenous  Once 03/03/24 1635 03/03/24 1729              Family Communication/Anticipated D/C date and plan/Code Status   DVT prophylaxis: Place TED hose Start: 03/03/24 1712 apixaban  (ELIQUIS ) tablet 5 mg     Code Status: Full Code  Family Communication: Plan discussed with Laurel,daughter, Disposition Plan: Plan to discharge to SNF   Status is: Inpatient Remains inpatient appropriate because: Altered mental status, s/p left ABA       Subjective:   Interval events noted.  She complains of pain in the hand.  She said this has been going on for about 2 weeks.  Laurel, daughter, was at the bedside.  Objective:    Vitals:   03/10/24 1611 03/10/24 2204 03/11/24 0542 03/11/24 0801  BP: (!) 103/46 (!) 97/56 (!) 105/45 (!) 133/45  Pulse: 85 80 77 77  Resp: 18 16 18 19   Temp: 98.3 F (36.8 C) 99.3 F (37.4 C) 98.1 F (36.7 C) 97.8 F (36.6 C)  TempSrc:  Oral    SpO2: 95% 96% 100% 100%  Weight:      Height:       No data found.   Intake/Output Summary (Last 24 hours) at 03/11/2024 1118 Last data filed at 03/11/2024 0900 Gross per 24 hour  Intake 180 ml  Output 350 ml  Net -170 ml   Filed Weights    03/03/24 1826 03/08/24 0825  Weight: 110.3 kg 112.9 kg    Exam:  GEN: NAD SKIN: Warm and dry EYES: No pallor or icterus ENT: MMM CV: RRR PULM: CTA B ABD: soft, ND, NT, +BS CNS: AAO x person place and time but not situation, nonfocal EXT: Left BKA, wound, erythema and tenderness of the dorsal aspect of left hand        Data Reviewed:   I have personally reviewed following labs and imaging studies:  Labs: Labs show the following:   Basic Metabolic Panel: Recent Labs  Lab 03/06/24 0950 03/08/24 0549 03/09/24 0422 03/10/24 0440 03/11/24 0437  NA 134* 137 135 136 139  K 4.0 4.2 3.7 3.9 4.0  CL 105 104 102 103 103  CO2 20* 24 22 22 23   GLUCOSE 154* 100* 93 115* 137*  BUN 21 29* 33* 30* 25*  CREATININE 1.21* 1.57* 1.88* 1.54* 1.57*  CALCIUM  8.4* 8.2* 8.5* 8.7* 8.8*   GFR Estimated Creatinine Clearance: 36.4 mL/min (A) (by C-G formula based on SCr of 1.57 mg/dL (H)). Liver Function Tests: No results for input(s): AST, ALT, ALKPHOS, BILITOT, PROT, ALBUMIN  in the last 168 hours. No results for input(s): LIPASE, AMYLASE in the last 168 hours. No results for input(s): AMMONIA in the last 168 hours. Coagulation profile No results for input(s): INR, PROTIME in the last 168 hours.  CBC: Recent Labs  Lab 03/06/24 0950 03/08/24 0549 03/09/24 0422 03/10/24 0440 03/11/24 0437  WBC 12.7* 13.8* 15.5* 15.1* 15.3*  HGB 9.5* 8.8* 8.4* 9.0* 8.7*  HCT 30.4* 27.5* 25.9* 28.5* 27.6*  MCV 91.0 90.5 89.9 90.8 90.2  PLT 339 347 348 408* 432*   Cardiac Enzymes: No results for input(s): CKTOTAL, CKMB, CKMBINDEX, TROPONINI in the last 168 hours. BNP (last 3 results) No results for input(s): PROBNP in the last 8760 hours. CBG: Recent Labs  Lab 03/10/24 0741 03/10/24 1148 03/10/24 1613 03/10/24 2133 03/11/24 0800  GLUCAP 139* 190* 165* 152* 151*  D-Dimer: No results for input(s): DDIMER in the last 72 hours. Hgb A1c: No results for  input(s): HGBA1C in the last 72 hours. Lipid Profile: No results for input(s): CHOL, HDL, LDLCALC, TRIG, CHOLHDL, LDLDIRECT in the last 72 hours. Thyroid  function studies: No results for input(s): TSH, T4TOTAL, T3FREE, THYROIDAB in the last 72 hours.  Invalid input(s): FREET3 Anemia work up: No results for input(s): VITAMINB12, FOLATE, FERRITIN, TIBC, IRON , RETICCTPCT in the last 72 hours. Sepsis Labs: Recent Labs  Lab 03/08/24 0549 03/09/24 0422 03/10/24 0440 03/11/24 0437  WBC 13.8* 15.5* 15.1* 15.3*    Microbiology Recent Results (from the past 240 hours)  Blood culture (routine x 2)     Status: Abnormal   Collection Time: 03/03/24  5:01 PM   Specimen: BLOOD RIGHT FOREARM  Result Value Ref Range Status   Specimen Description   Final    BLOOD RIGHT FOREARM Performed at [facility], [address], [place], [place] [address]    Special Requests   Final    BOTTLES DRAWN AEROBIC AND ANAEROBIC Blood Culture results may not be optimal due to an inadequate volume of blood received in culture bottles Performed at [facility], [address] [place], [place] [address]    Culture  Setup Time   Final    GRAM POSITIVE COCCI ANAEROBIC BOTTLE ONLY CRITICAL RESULT CALLED TO, READ BACK BY AND VERIFIED WITH: TIFFANY GILCHRIST, PHARMD AT 1010 ON 03/04/24 GM    Culture (A)  Final    STAPHYLOCOCCUS HOMINIS THE SIGNIFICANCE OF ISOLATING THIS ORGANISM FROM A SINGLE SET OF BLOOD CULTURES WHEN MULTIPLE SETS ARE DRAWN IS UNCERTAIN. PLEASE NOTIFY THE MICROBIOLOGY DEPARTMENT WITHIN ONE WEEK IF SPECIATION AND SENSITIVITIES ARE REQUIRED. Performed at [facility], [address] [place], [place] [address]    Report Status 03/06/2024 FINAL  Final  Blood Culture ID Panel (Reflexed)     Status: Abnormal   Collection Time: 03/03/24  5:01 PM  Result Value Ref Range Status   Enterococcus faecalis NOT DETECTED NOT DETECTED Final    Enterococcus Faecium NOT DETECTED NOT DETECTED Final   Listeria monocytogenes NOT DETECTED NOT DETECTED Final   Staphylococcus species DETECTED (A) NOT DETECTED Final    Comment: CRITICAL RESULT CALLED TO, READ BACK BY AND VERIFIED WITH: TIFFANY GILCHRIST, PHARMD AT 1010 ON 03/04/24 GM    Staphylococcus aureus (BCID) NOT DETECTED NOT DETECTED Final   Staphylococcus epidermidis NOT DETECTED NOT DETECTED Final   Staphylococcus lugdunensis NOT DETECTED NOT DETECTED Final   Streptococcus species NOT DETECTED NOT DETECTED Final   Streptococcus agalactiae NOT DETECTED NOT DETECTED Final   Streptococcus pneumoniae NOT DETECTED NOT DETECTED Final   Streptococcus pyogenes NOT DETECTED NOT DETECTED Final   A.calcoaceticus-baumannii NOT DETECTED NOT DETECTED Final   Bacteroides fragilis NOT DETECTED NOT DETECTED Final   Enterobacterales NOT DETECTED NOT DETECTED Final   Enterobacter cloacae complex NOT DETECTED NOT DETECTED Final   Escherichia coli NOT DETECTED NOT DETECTED Final   Klebsiella aerogenes NOT DETECTED NOT DETECTED Final   Klebsiella oxytoca NOT DETECTED NOT DETECTED Final   Klebsiella pneumoniae NOT DETECTED NOT DETECTED Final   Proteus species NOT DETECTED NOT DETECTED Final   Salmonella species NOT DETECTED NOT DETECTED Final   Serratia marcescens NOT DETECTED NOT DETECTED Final   Haemophilus influenzae NOT DETECTED NOT DETECTED Final   Neisseria meningitidis NOT DETECTED NOT DETECTED Final   Pseudomonas aeruginosa NOT DETECTED NOT DETECTED Final   Stenotrophomonas maltophilia NOT DETECTED  NOT DETECTED Final   Candida albicans NOT DETECTED NOT DETECTED Final   Candida auris NOT DETECTED NOT DETECTED Final   Candida glabrata NOT DETECTED NOT DETECTED Final   Candida krusei NOT DETECTED NOT DETECTED Final   Candida parapsilosis NOT DETECTED NOT DETECTED Final   Candida tropicalis NOT DETECTED NOT DETECTED Final   Cryptococcus neoformans/gattii NOT DETECTED NOT DETECTED Final     Comment: Performed at [facility], [address] [place], [place] [address]  Blood culture (routine x 2)     Status: None   Collection Time: 03/03/24  7:01 PM   Specimen: BLOOD  Result Value Ref Range Status   Specimen Description BLOOD BLOOD RIGHT HAND  Final   Special Requests   Final    BOTTLES DRAWN AEROBIC AND ANAEROBIC Blood Culture results may not be optimal due to an inadequate volume of blood received in culture bottles   Culture   Final    NO GROWTH 5 DAYS Performed at [facility], [address] [place], [place] [address]    Report Status 03/08/2024 FINAL  Final  Culture, blood (Routine X 2) w Reflex to ID Panel     Status: None (Preliminary result)   Collection Time: 03/10/24  1:31 PM   Specimen: BLOOD  Result Value Ref Range Status   Specimen Description BLOOD BLOOD RIGHT HAND  Final   Special Requests   Final    BOTTLES DRAWN AEROBIC ONLY Blood Culture results may not be optimal due to an inadequate volume of blood received in culture bottles   Culture   Final    NO GROWTH < 24 HOURS Performed at [facility], [address] [place], [place] [address]    Report Status PENDING  Incomplete  Culture, blood (Routine X 2) w Reflex to ID Panel     Status: None (Preliminary result)   Collection Time: 03/10/24  3:07 PM   Specimen: BLOOD  Result Value Ref Range Status   Specimen Description BLOOD BLOOD RIGHT HAND  Final   Special Requests   Final    BOTTLES DRAWN AEROBIC AND ANAEROBIC Blood Culture adequate volume   Culture   Final    NO GROWTH < 24 HOURS Performed at [facility], [address] [place], [place] [address]    Report Status PENDING  Incomplete    Procedures and diagnostic studies:  DG Chest 1 View Result Date: 03/10/2024 CLINICAL DATA:  [id number] PNA (pneumonia) [id number] EXAM: CHEST  1 VIEW COMPARISON:  Chest x-ray 03/28/2023. FINDINGS: The heart and mediastinal contours are unchanged. Atherosclerotic plaque. No  focal consolidation. No pulmonary edema. No pleural effusion. No pneumothorax. No acute osseous abnormality.  Reversed right shoulder arthroplasty. IMPRESSION: No active disease. Electronically Signed   By: Morgane  Naveau M.D.   On: 03/10/2024 17:15               LOS: 8 days   Juanisha Bautch  [facility]   Pager on [email or web address]. If 7PM-7AM, please contact night-coverage at [email or web address]     03/11/2024, 11:18 AM

## 2024-03-11 NOTE — Progress Notes (Signed)
 Patient alert, oriented to self, location and time.  Patient refused RN assessment and all medication, I need to talk to all of my doctors in my team before I take any medication.  Dr Jens notified.

## 2024-03-11 NOTE — Progress Notes (Signed)
 The patient's IV to her RAC came out during dressing change. The IV team has been notified due to the patient is a hard stick. DR. Lawence notified.

## 2024-03-11 NOTE — Progress Notes (Signed)
 [facility]  ROUNDING NOTE   Subjective:   Denise Sharp is a 78 y.o. female with past medical conditions including CAD, hyperlipidemia, hypertension, and chronic kidney disease IIIb. She presents to the ED for evaluation for wound infection and has been admitted for Cellulitis of left foot [L03.116]  Patient is known to our practice and is followed by Dr Dominica. Patient was last seen in office on 01/07/24 for routine follow    Patient remains confused today Very suspicious of staff, medications.    Creatinine 1.57   Objective:  Vital signs in last 24 hours:  Temp:  [97.8 F (36.6 C)-99.3 F (37.4 C)] 97.8 F (36.6 C) (08/27 0801) Pulse Rate:  [77-85] 77 (08/27 0801) Resp:  [16-19] 19 (08/27 0801) BP: (97-133)/(45-56) 133/45 (08/27 0801) SpO2:  [95 %-100 %] 100 % (08/27 0801) FiO2 (%):  [21 %] 21 % (08/26 1954)  Weight change:  Filed Weights   03/03/24 1826 03/08/24 0825  Weight: 110.3 kg 112.9 kg    Intake/Output: I/O last 3 completed shifts: In: -  Out: 2050 [Urine:2050]   Intake/Output this shift:  Total I/O In: 180 [P.O.:180] Out: -   Physical Exam: General: NAD  Head: Normocephalic, atraumatic. Moist oral mucosal membranes  Eyes: Anicteric  Lungs:  Clear to auscultation, room air  Heart: Regular rate and rhythm  Abdomen:  Soft, nontender  Extremities:  No peripheral edema.Lt BKA  Neurologic: Awake, alert to self, conversant  Skin: Warm,dry, no rash       Basic Metabolic Panel: Recent Labs  Lab 03/06/24 0950 03/08/24 0549 03/09/24 0422 03/10/24 0440 03/11/24 0437  NA 134* 137 135 136 139  K 4.0 4.2 3.7 3.9 4.0  CL 105 104 102 103 103  CO2 20* 24 22 22 23   GLUCOSE 154* 100* 93 115* 137*  BUN 21 29* 33* 30* 25*  CREATININE 1.21* 1.57* 1.88* 1.54* 1.57*  CALCIUM  8.4* 8.2* 8.5* 8.7* 8.8*    Liver Function Tests: No results for input(s): AST, ALT, ALKPHOS, BILITOT, PROT, ALBUMIN  in the last 168 hours.  No  results for input(s): LIPASE, AMYLASE in the last 168 hours. No results for input(s): AMMONIA in the last 168 hours.  CBC: Recent Labs  Lab 03/06/24 0950 03/08/24 0549 03/09/24 0422 03/10/24 0440 03/11/24 0437  WBC 12.7* 13.8* 15.5* 15.1* 15.3*  HGB 9.5* 8.8* 8.4* 9.0* 8.7*  HCT 30.4* 27.5* 25.9* 28.5* 27.6*  MCV 91.0 90.5 89.9 90.8 90.2  PLT 339 347 348 408* 432*    Cardiac Enzymes: No results for input(s): CKTOTAL, CKMB, CKMBINDEX, TROPONINI in the last 168 hours.  BNP: Invalid input(s): POCBNP  CBG: Recent Labs  Lab 03/10/24 0741 03/10/24 1148 03/10/24 1613 03/10/24 2133 03/11/24 0800  GLUCAP 139* 190* 165* 152* 151*    Microbiology: Results for orders placed or performed during the hospital encounter of 03/03/24  Blood culture (routine x 2)     Status: Abnormal   Collection Time: 03/03/24  5:01 PM   Specimen: BLOOD RIGHT FOREARM  Result Value Ref Range Status   Specimen Description   Final    BLOOD RIGHT FOREARM Performed at [facility], [address] [place], [place] [address]    Special Requests   Final    BOTTLES DRAWN AEROBIC AND ANAEROBIC Blood Culture results may not be optimal due to an inadequate volume of blood received in culture bottles Performed at [facility], [address] [place], [place] [address]    Culture  Setup Time   Final    GRAM POSITIVE COCCI ANAEROBIC BOTTLE ONLY CRITICAL RESULT CALLED TO, READ BACK BY AND VERIFIED WITH: TIFFANY GILCHRIST, PHARMD AT 1010 ON 03/04/24 GM    Culture (A)  Final    STAPHYLOCOCCUS HOMINIS THE SIGNIFICANCE OF ISOLATING THIS ORGANISM FROM A SINGLE SET OF BLOOD CULTURES WHEN MULTIPLE SETS ARE DRAWN IS UNCERTAIN. PLEASE NOTIFY THE MICROBIOLOGY DEPARTMENT WITHIN ONE WEEK IF SPECIATION AND SENSITIVITIES ARE REQUIRED. Performed at [facility], [address], [place], [place] [address]    Report Status 03/06/2024 FINAL  Final  Blood Culture ID Panel (Reflexed)      Status: Abnormal   Collection Time: 03/03/24  5:01 PM  Result Value Ref Range Status   Enterococcus faecalis NOT DETECTED NOT DETECTED Final   Enterococcus Faecium NOT DETECTED NOT DETECTED Final   Listeria monocytogenes NOT DETECTED NOT DETECTED Final   Staphylococcus species DETECTED (A) NOT DETECTED Final    Comment: CRITICAL RESULT CALLED TO, READ BACK BY AND VERIFIED WITH: TIFFANY GILCHRIST, PHARMD AT 1010 ON 03/04/24 GM    Staphylococcus aureus (BCID) NOT DETECTED NOT DETECTED Final   Staphylococcus epidermidis NOT DETECTED NOT DETECTED Final   Staphylococcus lugdunensis NOT DETECTED NOT DETECTED Final   Streptococcus species NOT DETECTED NOT DETECTED Final   Streptococcus agalactiae NOT DETECTED NOT DETECTED Final   Streptococcus pneumoniae NOT DETECTED NOT DETECTED Final   Streptococcus pyogenes NOT DETECTED NOT DETECTED Final   A.calcoaceticus-baumannii NOT DETECTED NOT DETECTED Final   Bacteroides fragilis NOT DETECTED NOT DETECTED Final   Enterobacterales NOT DETECTED NOT DETECTED Final   Enterobacter cloacae complex NOT DETECTED NOT DETECTED Final   Escherichia coli NOT DETECTED NOT DETECTED Final   Klebsiella aerogenes NOT DETECTED NOT DETECTED Final   Klebsiella oxytoca NOT DETECTED NOT DETECTED Final   Klebsiella pneumoniae NOT DETECTED NOT DETECTED Final   Proteus species NOT DETECTED NOT DETECTED Final   Salmonella species NOT DETECTED NOT DETECTED Final   Serratia marcescens NOT DETECTED NOT DETECTED Final   Haemophilus influenzae NOT DETECTED NOT DETECTED Final   Neisseria meningitidis NOT DETECTED NOT DETECTED Final   Pseudomonas aeruginosa NOT DETECTED NOT DETECTED Final   Stenotrophomonas maltophilia NOT DETECTED NOT DETECTED Final   Candida albicans NOT DETECTED NOT DETECTED Final   Candida auris NOT DETECTED NOT DETECTED Final   Candida glabrata NOT DETECTED NOT DETECTED Final   Candida krusei NOT DETECTED NOT DETECTED Final   Candida parapsilosis NOT  DETECTED NOT DETECTED Final   Candida tropicalis NOT DETECTED NOT DETECTED Final   Cryptococcus neoformans/gattii NOT DETECTED NOT DETECTED Final    Comment: Performed at [facility], [address] [place], [place] [address]  Blood culture (routine x 2)     Status: None   Collection Time: 03/03/24  7:01 PM   Specimen: BLOOD  Result Value Ref Range Status   Specimen Description BLOOD BLOOD RIGHT HAND  Final   Special Requests   Final    BOTTLES DRAWN AEROBIC AND ANAEROBIC Blood Culture results may not be optimal due to an inadequate volume of blood received in culture bottles   Culture   Final    NO GROWTH 5 DAYS Performed at [facility], [address] [place], [place] [address]    Report Status 03/08/2024 FINAL  Final  Culture, blood (Routine X 2) w Reflex to ID Panel     Status: None (Preliminary result)   Collection Time: 03/10/24  1:31 PM  Specimen: BLOOD  Result Value Ref Range Status   Specimen Description BLOOD BLOOD RIGHT HAND  Final   Special Requests   Final    BOTTLES DRAWN AEROBIC ONLY Blood Culture results may not be optimal due to an inadequate volume of blood received in culture bottles   Culture   Final    NO GROWTH < 24 HOURS Performed at [facility], [address] [place], [place] [address]    Report Status PENDING  Incomplete  Culture, blood (Routine X 2) w Reflex to ID Panel     Status: None (Preliminary result)   Collection Time: 03/10/24  3:07 PM   Specimen: BLOOD  Result Value Ref Range Status   Specimen Description BLOOD BLOOD RIGHT HAND  Final   Special Requests   Final    BOTTLES DRAWN AEROBIC AND ANAEROBIC Blood Culture adequate volume   Culture   Final    NO GROWTH < 24 HOURS Performed at [facility], [address] [place], [place] [address]    Report Status PENDING  Incomplete   *Note: Due to a large number of results and/or encounters for the requested time period, some results have not been  displayed. A complete set of results can be found in Results Review.    Coagulation Studies: No results for input(s): LABPROT, INR in the last 72 hours.  Urinalysis: Recent Labs    03/10/24 1306  COLORURINE YELLOW*  LABSPEC 1.012  PHURINE 5.0  GLUCOSEU >=500*  HGBUR SMALL*  BILIRUBINUR NEGATIVE  KETONESUR 5*  PROTEINUR 30*  NITRITE NEGATIVE  LEUKOCYTESUR NEGATIVE      Imaging: DG Chest 1 View Result Date: 03/10/2024 CLINICAL DATA:  [id number] PNA (pneumonia) [id number] EXAM: CHEST  1 VIEW COMPARISON:  Chest x-ray 03/28/2023. FINDINGS: The heart and mediastinal contours are unchanged. Atherosclerotic plaque. No focal consolidation. No pulmonary edema. No pleural effusion. No pneumothorax. No acute osseous abnormality.  Reversed right shoulder arthroplasty. IMPRESSION: No active disease. Electronically Signed   By: Morgane  Naveau M.D.   On: 03/10/2024 17:15     Medications:    lactated ringers  50 mL/hr at 03/10/24 1751   piperacillin -tazobactam (ZOSYN )  IV 3.375 g (03/10/24 2226)    allopurinol   100 mg Oral Daily   apixaban   5 mg Oral BID   atorvastatin   80 mg Oral QHS   colchicine   0.6 mg Oral Daily   dapagliflozin  propanediol  10 mg Oral Daily   docusate sodium   100 mg Oral Daily   ezetimibe   10 mg Oral Daily   Finerenone   10 mg Oral q AM   fluticasone  furoate-vilanterol  1 puff Inhalation Daily   insulin  aspart  0-15 Units Subcutaneous TID WC   insulin  aspart  0-5 Units Subcutaneous QHS   pantoprazole   40 mg Oral Daily   pregabalin   25 mg Oral QHS   acetaminophen , fluticasone , lidocaine , nitroGLYCERIN , nystatin , polyethylene glycol, senna-docusate  Assessment/ Plan:  Ms. JOSLYNNE KLATT is a 78 y.o.  female with past medical conditions including CAD, hyperlipidemia, hypertension, and chronic kidney disease IIIb. She presents to the ED for evaluation for wound infection and has been admitted for Cellulitis of left foot [L03.116]   Acute Kidney Injury on chronic  kidney disease stage IIIb with baseline creatinine 1.2 and GFR of 45.  Acute kidney injury secondary to infection and hypotension.  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   Creatinine appears to have stabilized.  Will continue IVF. Patient encouraged to maintain oral intake.  Will order protein supplements between meals.   Lab Results  Component Value Date   CREATININE 1.57 (H) 03/11/2024   CREATININE 1.54 (H) 03/10/2024   CREATININE 1.88 (H) 03/09/2024    Intake/Output Summary (Last 24 hours) at 03/11/2024 1044 Last data filed at 03/11/2024 0900 Gross per 24 hour  Intake 180 ml  Output 550 ml  Net -370 ml    2. Anemia of chronic kidney disease Lab Results  Component Value Date   HGB 8.7 (L) 03/11/2024    Hgb slightly decreased but stable. Will monitor for need of ESA.   3. Secondary Hyperparathyroidism: with outpatient labs: PTH 149, phosphorus 4.5, calcium  8.9 on 09/30/23.   Lab Results  Component Value Date   CALCIUM  8.8 (L) 03/11/2024   CAION 1.17 06/04/2022    Bone minerals acceptable. Will continue to monitor.   4. Cellulitis, left foot wound with possible osteomyelitis. 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 Was on  Cephalexin , but transitioned to Zosyn    LOS: 8 Karem Tomaso 8/27/202510:44 AM

## 2024-03-11 NOTE — Progress Notes (Signed)
 Physical Therapy Treatment Patient Details Name: Denise Sharp MRN: [id number] DOB: [birth year] Today's Date: 03/11/2024   History of Present Illness Pt is a 78 year old with hypertension, hyperlipidemia, CAD, anxiety, asthma, CKD, who presents from urology for chief concern of worsening wounds of the left foot.  Dx: Cellulitis of left foot, SIRS (systemic inflammatory response syndrome), Persistent atrial fibrillation. 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    PT Comments  Co treatment with OT this session for safety. Pt demonstrates improved understanding with sequencing for bed mobility however is still requiring max physical assistance  x 2 with trunk and LE management to sit at EOB. She continues to require multimodal cuing and physical assistance with scooting up in bed. 3 attempts were made to stand using the sara Stedy however pt is unsuccessful d/t weakness.     If plan is discharge home, recommend the following: Assistance with cooking/housework;Assist for transportation;Help with stairs or ramp for entrance;Two people to help with walking and/or transfers;Two people to help with bathing/dressing/bathroom;Supervision due to cognitive status   Can travel by private vehicle     No  Equipment Recommendations  Hospital bed;Hoyer lift    Recommendations for Other Services       Precautions / Restrictions Precautions Precautions: Fall Recall of Precautions/Restrictions: Impaired Required Braces or Orthoses: Other Brace (immobilizer for LLE) Restrictions Weight Bearing Restrictions Per Provider Order: No     Mobility  Bed Mobility Overal bed mobility: Needs Assistance Bed Mobility: Supine to Sit, Sit to Supine     Supine to sit: Max assist, +2 for physical assistance Sit to supine: +2 for physical assistance, Max assist   General bed mobility comments: Able to initiate rolling using bed rails. Max Ax2 for supine<>.sit for trunk and LE management.     Transfers Overall transfer level: Needs assistance   Transfers: Sit to/from Stand Sit to Stand: Max assist, +2 physical assistance, From elevated surface, Via lift equipment           General transfer comment: Max A x 2 however pt unable to stand fully to put seat flaps down. Attempt to stand using Stedy x 3. Transfer via Lift Equipment: Stedy  Ambulation/Gait               General Gait Details: unable attempt ambulation safely   Stairs             Wheelchair Mobility     Tilt Bed    Modified Rankin (Stroke Patients Only)       Balance Overall balance assessment: Needs assistance Sitting-balance support: Feet supported, Single extremity supported Sitting balance-Leahy Scale: Good Sitting balance - Comments: Able to maintain seated balance while brsuhing hair at EOB. CGA-SBA for safety.       Standing balance comment: NT. Max A x 2 for 3 attempts to stand using Stedy, however pt is unable to perform full stand d/t weakness                            Communication Communication Communication: No apparent difficulties  Cognition Arousal: Alert Behavior During Therapy: WFL for tasks assessed/performed                           PT - Cognition Comments: Pt pleasant and agreeable to therapy Following commands: Impaired Following commands impaired: Follows one step commands with increased time    Cueing Cueing Techniques: Verbal  cues, Visual cues, Tactile cues  Exercises      General Comments        Pertinent Vitals/Pain Pain Assessment Pain Assessment: No/denies pain    Home Living                          Prior Function            PT Goals (current goals can now be found in the care plan section) Acute Rehab PT Goals Patient Stated Goal: none stated PT Goal Formulation: With patient Time For Goal Achievement: 03/22/24 Potential to Achieve Goals: Good Progress towards PT goals: Progressing toward  goals    Frequency    Min 2X/week      PT Plan      Co-evaluation PT/OT/SLP Co-Evaluation/Treatment: Yes Reason for Co-Treatment: For patient/therapist safety;To address functional/ADL transfers PT goals addressed during session: Mobility/safety with mobility OT goals addressed during session: ADL's and self-care      AM-PAC PT 6 Clicks Mobility   Outcome Measure  Help needed turning from your back to your side while in a flat bed without using bedrails?: A Lot Help needed moving from lying on your back to sitting on the side of a flat bed without using bedrails?: A Lot Help needed moving to and from a bed to a chair (including a wheelchair)?: Total Help needed standing up from a chair using your arms (e.g., wheelchair or bedside chair)?: Total Help needed to walk in hospital room?: Total Help needed climbing 3-5 steps with a railing? : Total 6 Click Score: 8    End of Session Equipment Utilized During Treatment: Gait belt Activity Tolerance: Patient tolerated treatment well Patient left: in bed;with call bell/phone within reach;with family/visitor present (Family declines wanting bed alarm on since she will be in room) Nurse Communication: Mobility status PT Visit Diagnosis: Muscle weakness (generalized) (M62.81);Difficulty in walking, not elsewhere classified (R26.2)     Time: 1127-1209 PT Time Calculation (min) (ACUTE ONLY): 42 min  Charges:                            Nori Poland, SPT    Shye Doty 03/11/2024, 1:10 PM

## 2024-03-11 NOTE — Plan of Care (Signed)
 The patient is alert, slightly less confused. Continues on IV Lactated Ringers  at 50ML/HR to [facility]. Continues on IV Zoyson For UTI. No s/s of adverse effects noted.

## 2024-03-11 NOTE — Progress Notes (Signed)
 Occupational Therapy Treatment Patient Details Name: Denise Sharp MRN: [id number] DOB: 05-19-1946 Today's Date: 03/11/2024   History of present illness Pt is a 78 year old with hypertension, hyperlipidemia, CAD, anxiety, asthma, CKD, who presents from urology for chief concern of worsening wounds of the left foot.  Dx: Cellulitis of left foot, SIRS (systemic inflammatory response syndrome), Persistent atrial fibrillation. 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   OT comments  Pt is supine in bed on arrival. Alert, oriented to person, place and time (increased time to guess year correctly) and agreeable to PT/OT co-tx session to maximize pt/therapist safety. She denies pain. Pt is able to follow directions better this date. Pt still requires Max A x2 for supine<>sit for BLE management and trunkal elevation. She is able to maintain seated balance at EOB during dynamic grooming tasks with CGA to SBA. Min A to brush out matted hair in the back. Attempted multiple STS from EOB to stedy despite Max A x2 pt unable to clear buttocks to use butt pads with LUE hand pain still limited her weight bearing and functional use. Attempted lateral scoots to [facility] with cues for proper technique, still requires Max A x2. Able to use bed rails and bed features to assist with scooting to [facility] and able to roll with Min/Mod A for chux pad management. Pt reports that was eye opening to realize I only have one leg now.  Pt returned to bed with all needs in place and will cont to require skilled acute OT services to maximize her safety and IND to return to PLOF.       If plan is discharge home, recommend the following:  A lot of help with bathing/dressing/bathroom;Help with stairs or ramp for entrance;Assistance with cooking/housework;Two people to help with walking and/or transfers   Equipment Recommendations  Other (comment) (defer)    Recommendations for Other Services      Precautions / Restrictions  Precautions Precautions: Fall Recall of Precautions/Restrictions: Impaired Required Braces or Orthoses: Other Brace (immobilizer for LLE) Other Brace: LLE immobilizer Restrictions Weight Bearing Restrictions Per Provider Order: No LLE Weight Bearing Per Provider Order: Non weight bearing Other Position/Activity Restrictions: has immobilizer over amputation site       Mobility Bed Mobility Overal bed mobility: Needs Assistance Bed Mobility: Supine to Sit, Sit to Supine Rolling: Min assist, Mod assist   Supine to sit: Max assist, +2 for physical assistance Sit to supine: +2 for physical assistance, Max assist   General bed mobility comments: able to assist with rolling and initiation of tasks, uses bed rails to assist with scooting up to [facility]; still requiring Max A x2 for supine<>sit for BLE management and trunkal elevation    Transfers Overall transfer level: Needs assistance Equipment used: Rolling walker (2 wheels) Transfers: Sit to/from Stand Sit to Stand: Via lift equipment           General transfer comment: attempted multiple STS from EOB with it elevated to stedy, however unable to clear buttocks fully even with Max A x2; pt is very weak with little to no use of L hand for weight bearing tasks d/t pain Transfer via Lift Equipment: Stedy   Balance Overall balance assessment: Needs assistance Sitting-balance support: Feet supported, Single extremity supported Sitting balance-Leahy Scale: Good Sitting balance - Comments: CGA/SBA for dynamic seated balance at EOB this date   Standing balance support: During functional activity, Reliant on assistive device for balance, Bilateral upper extremity supported Standing balance-Leahy Scale: Zero Standing  balance comment: unable to stand despite Max A x2 at stedy                           ADL either performed or assessed with clinical judgement   ADL Overall ADL's : Needs assistance/impaired     Grooming:  Brushing hair;Sitting;Minimal assistance Grooming Details (indicate cue type and reason): able to brush most of her hair on her own, assist for matted back of her head area                                    Extremity/Trunk Assessment              Vision       Perception     Praxis     Communication Communication Communication: No apparent difficulties   Cognition Arousal: Alert Behavior During Therapy: [facility] for tasks assessed/performed                                 Following commands: Impaired Following commands impaired: Follows one step commands with increased time      Cueing   Cueing Techniques: Verbal cues, Visual cues, Tactile cues  Exercises      Shoulder Instructions       General Comments      Pertinent Vitals/ Pain       Pain Assessment Pain Assessment: No/denies pain  Home Living                                          Prior Functioning/Environment              Frequency  Min 2X/week        Progress Toward Goals  OT Goals(current goals can now be found in the care plan section)  Progress towards OT goals: Progressing toward goals  Acute Rehab OT Goals Patient Stated Goal: improve strength and function OT Goal Formulation: With patient/family Time For Goal Achievement: 03/23/24 Potential to Achieve Goals: Fair  Plan      Co-evaluation    PT/OT/SLP Co-Evaluation/Treatment: Yes Reason for Co-Treatment: For patient/therapist safety;To address functional/ADL transfers PT goals addressed during session: Mobility/safety with mobility OT goals addressed during session: ADL's and self-care      AM-PAC OT 6 Clicks Daily Activity     Outcome Measure   Help from another person eating meals?: None Help from another person taking care of personal grooming?: A Little Help from another person toileting, which includes using toliet, bedpan, or urinal?: A Lot Help from another person  bathing (including washing, rinsing, drying)?: A Lot Help from another person to put on and taking off regular upper body clothing?: A Little Help from another person to put on and taking off regular lower body clothing?: A Lot 6 Click Score: 16    End of Session Equipment Utilized During Treatment: Gait belt (LLE immobilizer for amputation)  OT Visit Diagnosis: Unsteadiness on feet (R26.81);Muscle weakness (generalized) (M62.81);Other abnormalities of gait and mobility (R26.89)   Activity Tolerance Patient tolerated treatment well   Patient Left in bed;with call bell/phone within reach;with bed alarm set;with family/visitor present   Nurse Communication Mobility status        Time: 1127-1208  OT Time Calculation (min): 41 min  Charges: OT General Charges $OT Visit: 1 Visit OT Treatments $Self Care/Home Management : 8-22 mins  Haily Caley, OTR/L  03/11/24, 1:37 PM   Haydon Dorris E Shaft Corigliano 03/11/2024, 1:33 PM

## 2024-03-12 DIAGNOSIS — I96 Gangrene, not elsewhere classified: Secondary | ICD-10-CM | POA: Diagnosis not present

## 2024-03-12 DIAGNOSIS — Z89512 Acquired absence of left leg below knee: Secondary | ICD-10-CM

## 2024-03-12 LAB — IRON AND TIBC
Iron: 33 ug/dL (ref 28–170)
Saturation Ratios: 29 % (ref 10.4–31.8)
TIBC: 113 ug/dL — ABNORMAL LOW (ref 250–450)
UIBC: 80 ug/dL

## 2024-03-12 LAB — BASIC METABOLIC PANEL WITH GFR
Anion gap: 9 (ref 5–15)
BUN: 25 mg/dL — ABNORMAL HIGH (ref 8–23)
CO2: 25 mmol/L (ref 22–32)
Calcium: 8.3 mg/dL — ABNORMAL LOW (ref 8.9–10.3)
Chloride: 106 mmol/L (ref 98–111)
Creatinine, Ser: 1.57 mg/dL — ABNORMAL HIGH (ref 0.44–1.00)
GFR, Estimated: 34 mL/min — ABNORMAL LOW (ref >60)
Glucose, Bld: 162 mg/dL — ABNORMAL HIGH (ref 70–99)
Potassium: 4 mmol/L (ref 3.5–5.1)
Sodium: 140 mmol/L (ref 135–145)

## 2024-03-12 LAB — CBC
HCT: 28.8 % — ABNORMAL LOW (ref 36.0–46.0)
Hemoglobin: 8.9 g/dL — ABNORMAL LOW (ref 12.0–15.0)
MCH: 27.9 pg (ref 26.0–34.0)
MCHC: 30.9 g/dL (ref 30.0–36.0)
MCV: 90.3 fL (ref 80.0–100.0)
Platelets: 445 K/uL — ABNORMAL HIGH (ref 150–400)
RBC: 3.19 MIL/uL — ABNORMAL LOW (ref 3.87–5.11)
RDW: 15.5 % (ref 11.5–15.5)
WBC: 13.9 K/uL — ABNORMAL HIGH (ref 4.0–10.5)
nRBC: 0 % (ref 0.0–0.2)

## 2024-03-12 LAB — GLUCOSE, CAPILLARY
Glucose-Capillary: 151 mg/dL — ABNORMAL HIGH (ref 70–99)
Glucose-Capillary: 158 mg/dL — ABNORMAL HIGH (ref 70–99)
Glucose-Capillary: 169 mg/dL — ABNORMAL HIGH (ref 70–99)
Glucose-Capillary: 120 mg/dL — ABNORMAL HIGH (ref 70–99)

## 2024-03-12 LAB — FERRITIN: Ferritin: 2782 ng/mL — ABNORMAL HIGH (ref 11–307)

## 2024-03-12 MED ORDER — BISACODYL 10 MG RE SUPP
10.0000 mg | Freq: Once | RECTAL | Status: AC
  Administered 2024-03-12: 10 mg via RECTAL
  Filled 2024-03-12: qty 1

## 2024-03-12 MED ORDER — SENNOSIDES-DOCUSATE SODIUM 8.6-50 MG PO TABS
1.0000 | ORAL_TABLET | Freq: Two times a day (BID) | ORAL | Status: DC
  Administered 2024-03-13: 1 via ORAL
  Filled 2024-03-12 (×2): qty 1

## 2024-03-12 NOTE — Plan of Care (Signed)
  Problem: Education: Goal: Ability to describe self-care measures that may prevent or decrease complications (Diabetes Survival Skills Education) will improve Outcome: Progressing   Problem: Coping: Goal: Ability to adjust to condition or change in health will improve Outcome: Progressing   Problem: Fluid Volume: Goal: Ability to maintain a balanced intake and output will improve Outcome: Progressing   Problem: Health Behavior/Discharge Planning: Goal: Ability to identify and utilize available resources and services will improve Outcome: Progressing   Problem: Metabolic: Goal: Ability to maintain appropriate glucose levels will improve Outcome: Progressing   Problem: Nutritional: Goal: Maintenance of adequate nutrition will improve Outcome: Progressing   Problem: Skin Integrity: Goal: Risk for impaired skin integrity will decrease Outcome: Progressing   Problem: Tissue Perfusion: Goal: Adequacy of tissue perfusion will improve Outcome: Progressing   Problem: Education: Goal: Knowledge of General Education information will improve Description: Including pain rating scale, medication(s)/side effects and non-pharmacologic comfort measures Outcome: Progressing   Problem: Health Behavior/Discharge Planning: Goal: Ability to manage health-related needs will improve Outcome: Progressing   Problem: Activity: Goal: Risk for activity intolerance will decrease Outcome: Progressing   Problem: Nutrition: Goal: Adequate nutrition will be maintained Outcome: Progressing   Problem: Coping: Goal: Level of anxiety will decrease Outcome: Progressing   Problem: Skin Integrity: Goal: Risk for impaired skin integrity will decrease Outcome: Progressing

## 2024-03-12 NOTE — Progress Notes (Addendum)
 Progress Note    TALEEYA Sharp  FMW:[id number] DOB: [birth year]  DOA: 03/03/2024 PCP: Marylynn Verneita CROME, MD      Brief Narrative:    Medical records reviewed and are as summarized below:  Denise Sharp is a 78 y.o. female with hypertension, hyperlipidemia, CAD, anxiety, asthma, CKD, who presents from urology for chief concern of worsening wounds of the left foot, reports low grade fever at home, chills and increasing pain with discharge of her left foot. Progressive charcoaled arthropathy vs superimposed OM as per XR. MRI with worsening Charot joint and possible OM, Podiatry recommend Left BKA which patient underwent on 08/23 by Vascular Surgery      Assessment/Plan:   Principal Problem:   Gangrene of left foot (HCC) Active Problems:   SIRS (systemic inflammatory response syndrome) (HCC)   Type II diabetes mellitus with neurological manifestations (HCC)   OSA on CPAP   Iron  deficiency anemia   Obesity hypoventilation syndrome (HCC)   Coronary artery disease of native artery of native heart with stable angina pectoris (HCC)   Persistent atrial fibrillation (HCC)   Restless legs syndrome   Morbid obesity (HCC)   Hypersomnia, persistent   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 Met SIRS criteria on adm (fever, leukocytosis, lactic acidosis) - Bcx 1/4: GPC (Staph species), likely contaminant  - XR Left foot advanced osseous destructive changes and fragmentation of the midfoot,  consistent with Charcot arthropathy.  Progressive charco arthropathy vs superimposed OM - MRI Left foot shows severe Charcot changes, findings concerning for early OM vs reactive changes throughout talus. No evidence of abscess - Seen by Podiatry, who recommended left BKA - Seen by Vascular surgery, s/p Left BKA 08/23, pathology sent Ordered oxycodone  and IV Dilaudid  as needed for pain control.    LUE cellulitis/gout involving the left hand (painful swelling, erythema of dorsal aspect of left hand)- improving - Continue IV Zosyn . - Continue colchicine  and allopurinol .    Acute toxic/metabolic encephalopathy on 08/24 Mental status has improved. - Suspect multifactorial due to medication (opioids, hydroxyzine , zanaflex ), hospital acquired deirium, AKI on CKD - No acute abnormality on CT head on 03/09/2023 -Zanaflex , Hydroxyzine , Opioids, Cymbalta , IV morphine  have been discontinued. Continue Lyrica  Urinalysis 120/2025 negative for UTI.  (patient has recent UTI prior to adm, also leukocytosis)   Left knee pain s/p Total left knee arthroplasty - resolved - XR No acute fracture or dislocation - Pain management    AKI on CKD stage 3a baseline appears 1.1-1.3,  Creatinine down from 1.88-1.57-1.57. Disontinue IV fluids later today.  Monitor BMP and follow-up with nephrologist.   Hypotension-improved Hx HTN S/p IV albumin  and IV fluids   Persistent atrial fibrillation (HCC) - Continue Eliquis     NICM with preserved ejection fraction Torsemide  on hold    Type II diabetes mellitus with neurological manifestations (HCC) Home long acting insulin , hold Lantus  25u - poor po intake due to AMS and AKI Insulin  SSI with HS  coverage Goal inpatient blood glucose level 140-180     History of gout Continue colchicine  and allopurinol      Obesity hypoventilation syndrome (HCC) Cpap at bedtime ordered    Weakness Fall precaution PT/OT rec SNF when ready   Constipation Continue  Senokot and MiraLAX  as needed. She is willing to try Dulcolax suppository.   Comorbidities include generalized anxiety disorder, major depressive, hyperlipidemia, iron  deficiency anemia (periodic iron  infusion in the outpatient setting)   Plan discussed with her husband and the daughter at the bedside.   Diet Order             Diet heart healthy/carb modified Room service appropriate? Yes; Fluid consistency: Thin  Diet effective now                                  Consultants: Podiatrist Vascular surgeon  Procedures: Left below-knee amputation on 03/08/2023    Medications:    allopurinol   100 mg Oral Daily   apixaban   5 mg Oral BID   atorvastatin   80 mg Oral QHS   colchicine   0.6 mg Oral Daily   dapagliflozin  propanediol  10 mg Oral Daily   ezetimibe   10 mg Oral Daily   feeding supplement  237 mL Oral BID BM   Finerenone   10 mg Oral q AM   fluticasone  furoate-vilanterol  1 puff Inhalation Daily   insulin  aspart  0-15 Units Subcutaneous TID WC   insulin  aspart  0-5 Units Subcutaneous QHS   pantoprazole   40 mg Oral Daily   pregabalin   25 mg Oral QHS   senna-docusate  1 tablet Oral BID   Continuous Infusions:  sodium chloride  50 mL/hr at 03/12/24 0553   piperacillin -tazobactam (ZOSYN )  IV 3.375 g (03/12/24 1405)     Anti-infectives (From admission, onward)    Start     Dose/Rate Route Frequency Ordered Stop   03/10/24 1430  piperacillin -tazobactam (ZOSYN ) IVPB 3.375 g        3.375 g 12.5 mL/hr over 240 Minutes Intravenous Every 8 hours 03/10/24 1337     03/08/24 1800  cephALEXin  (KEFLEX ) capsule 500 mg  Status:  Discontinued        500 mg Oral Every 6 hours 03/08/24 1419 03/10/24 1337   03/06/24 0500  ceFEPIme  (MAXIPIME ) 2 g in sodium chloride  0.9 % 100 mL IVPB  Status:  Discontinued        2 g 200 mL/hr over 30 Minutes Intravenous Every 12 hours 03/05/24 2309 03/08/24 1419   03/05/24 1700  vancomycin  (VANCOCIN ) IVPB 1000 mg/200 mL premix   Status:  Discontinued        1,000 mg 200 mL/hr over 60 Minutes Intravenous Every 24 hours 03/05/24 0813 03/08/24 0828   03/05/24 1200  ceFEPIme  (MAXIPIME ) 2 g in sodium chloride  0.9 % 100 mL IVPB  Status:  Discontinued        2 g 200 mL/hr over 30 Minutes Intravenous 2 times daily 03/05/24 1128 03/05/24 2309   03/04/24 1700  vancomycin  (VANCOREADY) IVPB 1000 mg/200 mL  Status:  Discontinued        1,000 mg 200 mL/hr over 60 Minutes Intravenous Every 24 hours 03/03/24 1738 03/05/24 0813   03/03/24 1730  vancomycin  (VANCOREADY) IVPB 2000 mg/400 mL        2,000 mg 200 mL/hr over 120 Minutes Intravenous  Once 03/03/24 1729 03/04/24 0534   03/03/24 1645  vancomycin  (VANCOCIN ) IVPB 1000  mg/200 mL premix  Status:  Discontinued        1,000 mg 200 mL/hr over 60 Minutes Intravenous  Once 03/03/24 1635 03/03/24 1729              Family Communication/Anticipated D/C date and plan/Code Status   DVT prophylaxis: Place TED hose Start: 03/03/24 1712 apixaban  (ELIQUIS ) tablet 5 mg     Code Status: Full Code  Family Communication: Plan discussed with husband and Laurel,daughter, Disposition Plan: Plan to discharge to SNF   Status is: Inpatient Remains inpatient appropriate because: Altered mental status, s/p left ABA       Subjective:   Interval events noted.  She complains of constipation and passing gas when she tries to defecate.  Pain and swelling on the left hand is improving.  Her husband and daughter were at the bedside  Objective:    Vitals:   03/11/24 1938 03/12/24 0404 03/12/24 0845 03/12/24 1539  BP: (!) 116/52 123/60 128/63 (!) 120/44  Pulse: 80 72 76 75  Resp: 18 18 16 16   Temp: 98.3 F (36.8 C) (!) 97.4 F (36.3 C) 98.4 F (36.9 C) 98.2 F (36.8 C)  TempSrc:    Oral  SpO2: 97% 96% 98% 97%  Weight:      Height:       No data found.   Intake/Output Summary (Last 24 hours) at 03/12/2024 1543 Last data filed at 03/11/2024 2230 Gross per 24 hour   Intake 180 ml  Output 550 ml  Net -370 ml   Filed Weights   03/03/24 1826 03/08/24 0825  Weight: 110.3 kg 112.9 kg    Exam:   GEN: NAD SKIN: Warm and dry EYES: No pallor or icterus   ENT: MMM CV: RRR PULM: CTA B ABD: soft, obese, NT, +BS CNS: AAO x 3, non focal EXT: Swelling, tenderness and erythema on dorsal aspect of left hand are improving.  Left BKA.       Data Reviewed:   I have personally reviewed following labs and imaging studies:  Labs: Labs show the following:   Basic Metabolic Panel: Recent Labs  Lab 03/08/24 0549 03/09/24 0422 03/10/24 0440 03/11/24 0437 03/12/24 0549  NA 137 135 136 139 140  K 4.2 3.7 3.9 4.0 4.0  CL 104 102 103 103 106  CO2 24 22 22 23 25   GLUCOSE 100* 93 115* 137* 162*  BUN 29* 33* 30* 25* 25*  CREATININE 1.57* 1.88* 1.54* 1.57* 1.57*  CALCIUM  8.2* 8.5* 8.7* 8.8* 8.3*   GFR Estimated Creatinine Clearance: 36.4 mL/min (A) (by C-G formula based on SCr of 1.57 mg/dL (H)). Liver Function Tests: No results for input(s): AST, ALT, ALKPHOS, BILITOT, PROT, ALBUMIN  in the last 168 hours. No results for input(s): LIPASE, AMYLASE in the last 168 hours. No results for input(s): AMMONIA in the last 168 hours. Coagulation profile No results for input(s): INR, PROTIME in the last 168 hours.  CBC: Recent Labs  Lab 03/08/24 0549 03/09/24 0422 03/10/24 0440 03/11/24 0437 03/12/24 0549  WBC 13.8* 15.5* 15.1* 15.3* 13.9*  HGB 8.8* 8.4* 9.0* 8.7* 8.9*  HCT 27.5* 25.9* 28.5* 27.6* 28.8*  MCV 90.5 89.9 90.8 90.2 90.3  PLT 347 348 408* 432* 445*   Cardiac Enzymes: No results for input(s): CKTOTAL, CKMB, CKMBINDEX, TROPONINI in the last 168 hours. BNP (last 3 results) No results for input(s): PROBNP in the last 8760 hours. CBG: Recent Labs  Lab 03/11/24 1229 03/11/24 1710 03/11/24 2129 03/12/24 0843 03/12/24 1152  GLUCAP 155* 137* 146* 151* 158*   D-Dimer: No results for input(s): DDIMER  in the last 72 hours. Hgb A1c: No results for input(s): HGBA1C in the last 72 hours. Lipid Profile: No results for input(s): CHOL, HDL, LDLCALC, TRIG, CHOLHDL, LDLDIRECT in the last 72 hours. Thyroid  function studies: No results for input(s): TSH, T4TOTAL, T3FREE, THYROIDAB in the last 72 hours.  Invalid input(s): FREET3 Anemia work up: Recent Labs    03/12/24 0549  FERRITIN 2,782*  TIBC 113*  IRON  33   Sepsis Labs: Recent Labs  Lab 03/09/24 0422 03/10/24 0440 03/11/24 0437 03/12/24 0549  WBC 15.5* 15.1* 15.3* 13.9*    Microbiology Recent Results (from the past 240 hours)  Blood culture (routine x 2)     Status: Abnormal   Collection Time: 03/03/24  5:01 PM   Specimen: BLOOD RIGHT FOREARM  Result Value Ref Range Status   Specimen Description   Final    BLOOD RIGHT FOREARM Performed at [facility], [address] [place], [place] [address]    Special Requests   Final    BOTTLES DRAWN AEROBIC AND ANAEROBIC Blood Culture results may not be optimal due to an inadequate volume of blood received in culture bottles Performed at [facility], [address] [place], [place] [address]    Culture  Setup Time   Final    GRAM POSITIVE COCCI ANAEROBIC BOTTLE ONLY CRITICAL RESULT CALLED TO, READ BACK BY AND VERIFIED WITH: TIFFANY GILCHRIST, PHARMD AT 1010 ON 03/04/24 GM    Culture (A)  Final    STAPHYLOCOCCUS HOMINIS THE SIGNIFICANCE OF ISOLATING THIS ORGANISM FROM A SINGLE SET OF BLOOD CULTURES WHEN MULTIPLE SETS ARE DRAWN IS UNCERTAIN. PLEASE NOTIFY THE MICROBIOLOGY DEPARTMENT WITHIN ONE WEEK IF SPECIATION AND SENSITIVITIES ARE REQUIRED. Performed at [facility], [address] [place], [place] [address]    Report Status 03/06/2024 FINAL  Final  Blood Culture ID Panel (Reflexed)     Status: Abnormal   Collection Time: 03/03/24  5:01 PM  Result Value Ref Range Status   Enterococcus faecalis NOT DETECTED NOT DETECTED Final    Enterococcus Faecium NOT DETECTED NOT DETECTED Final   Listeria monocytogenes NOT DETECTED NOT DETECTED Final   Staphylococcus species DETECTED (A) NOT DETECTED Final    Comment: CRITICAL RESULT CALLED TO, READ BACK BY AND VERIFIED WITH: TIFFANY GILCHRIST, PHARMD AT 1010 ON 03/04/24 GM    Staphylococcus aureus (BCID) NOT DETECTED NOT DETECTED Final   Staphylococcus epidermidis NOT DETECTED NOT DETECTED Final   Staphylococcus lugdunensis NOT DETECTED NOT DETECTED Final   Streptococcus species NOT DETECTED NOT DETECTED Final   Streptococcus agalactiae NOT DETECTED NOT DETECTED Final   Streptococcus pneumoniae NOT DETECTED NOT DETECTED Final   Streptococcus pyogenes NOT DETECTED NOT DETECTED Final   A.calcoaceticus-baumannii NOT DETECTED NOT DETECTED Final   Bacteroides fragilis NOT DETECTED NOT DETECTED Final   Enterobacterales NOT DETECTED NOT DETECTED Final   Enterobacter cloacae complex NOT DETECTED NOT DETECTED Final   Escherichia coli NOT DETECTED NOT DETECTED Final   Klebsiella aerogenes NOT DETECTED NOT DETECTED Final   Klebsiella oxytoca NOT DETECTED NOT DETECTED Final   Klebsiella pneumoniae NOT DETECTED NOT DETECTED Final   Proteus species NOT DETECTED NOT DETECTED Final   Salmonella species NOT DETECTED NOT DETECTED Final   Serratia marcescens NOT DETECTED NOT DETECTED Final   Haemophilus influenzae NOT DETECTED NOT DETECTED Final   Neisseria meningitidis NOT DETECTED NOT DETECTED Final   Pseudomonas aeruginosa  NOT DETECTED NOT DETECTED Final   Stenotrophomonas maltophilia NOT DETECTED NOT DETECTED Final   Candida albicans NOT DETECTED NOT DETECTED Final   Candida auris NOT DETECTED NOT DETECTED Final   Candida glabrata NOT DETECTED NOT DETECTED Final   Candida krusei NOT DETECTED NOT DETECTED Final   Candida parapsilosis NOT DETECTED NOT DETECTED Final   Candida tropicalis NOT DETECTED NOT DETECTED Final   Cryptococcus neoformans/gattii NOT DETECTED NOT DETECTED Final     Comment: Performed at [facility], [address] [place], [place] [address]  Blood culture (routine x 2)     Status: None   Collection Time: 03/03/24  7:01 PM   Specimen: BLOOD  Result Value Ref Range Status   Specimen Description BLOOD BLOOD RIGHT HAND  Final   Special Requests   Final    BOTTLES DRAWN AEROBIC AND ANAEROBIC Blood Culture results may not be optimal due to an inadequate volume of blood received in culture bottles   Culture   Final    NO GROWTH 5 DAYS Performed at [facility], [address] [place], [place] [address]    Report Status 03/08/2024 FINAL  Final  Culture, blood (Routine X 2) w Reflex to ID Panel     Status: None (Preliminary result)   Collection Time: 03/10/24  1:31 PM   Specimen: BLOOD  Result Value Ref Range Status   Specimen Description BLOOD BLOOD RIGHT HAND  Final   Special Requests   Final    BOTTLES DRAWN AEROBIC ONLY Blood Culture results may not be optimal due to an inadequate volume of blood received in culture bottles   Culture   Final    NO GROWTH < 24 HOURS Performed at [facility], [address], [place], [place] [address]    Report Status PENDING  Incomplete  Culture, blood (Routine X 2) w Reflex to ID Panel     Status: None (Preliminary result)   Collection Time: 03/10/24  3:07 PM   Specimen: BLOOD  Result Value Ref Range Status   Specimen Description BLOOD BLOOD RIGHT HAND  Final   Special Requests   Final    BOTTLES DRAWN AEROBIC AND ANAEROBIC Blood Culture adequate volume   Culture   Final    NO GROWTH < 24 HOURS Performed at [facility], [address] [place], [place] [address]    Report Status PENDING  Incomplete    Procedures and diagnostic studies:  No results found.              LOS: 9 days   Lakyia Behe  [facility]   Pager on [email or web address]. If 7PM-7AM, please contact night-coverage at [email or web address]     03/12/2024, 3:43 PM

## 2024-03-12 NOTE — TOC Progression Note (Signed)
 Transition of Care [facility]) - Progression Note    Patient Details  Name: Denise Sharp MRN: [id number] Date of Birth: June 01, 1946  [facility] of [facility]) CM/SW Contact  Dalia GORMAN Fuse, RN Phone Number: 03/12/2024, 3:12 PM  Clinical Narrative:    S/p L BKA, remains inpatient appropriate for elevated creatnine, LUE cellulitis/ gout, on allopurinol  and colchicine , altered mental status (improving). Plan to dc to [facility] when medically appropriate.    Expected Discharge Plan:  (TBD) Barriers to Discharge: Continued Medical Work up               Expected Discharge Plan and Services       Living arrangements for the past 2 months: Skilled Nursing Facility                                       Social Drivers of Health (SDOH) Interventions SDOH Screenings   Food Insecurity: No Food Insecurity (03/04/2024)  Housing: Low Risk  (03/04/2024)  Transportation Needs: No Transportation Needs (03/04/2024)  Utilities: Not At Risk (03/04/2024)  Depression (PHQ2-9): High Risk (01/09/2024)  Financial Resource Strain: Low Risk  (09/15/2021)  Physical Activity: Insufficiently Active (11/15/2020)  Social Connections: Unknown (03/04/2024)  Stress: Stress Concern Present (08/03/2020)  Tobacco Use: Low Risk  (03/09/2024)    Readmission Risk Interventions     No data to display

## 2024-03-12 NOTE — Progress Notes (Signed)
 Occupational Therapy Treatment Patient Details Name: Denise Sharp MRN: [id number] DOB: [birth year] Today's Date: 03/12/2024   History of present illness Pt is a 78 year old with hypertension, hyperlipidemia, CAD, anxiety, asthma, CKD, who presents from urology for chief concern of worsening wounds of the left foot.  Dx: Cellulitis of left foot, SIRS (systemic inflammatory response syndrome), Persistent atrial fibrillation. 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   OT comments  Pt is supine in bed on arrival. Easily arousable and agreeable to OT session. She denies pain. Pt performed supine to sit at EOB with 1 person assist this date (Mod/Max A) with cues for bed rail use and assist to scoot hips towards EOB. Pt able to maintain seated balance at EOB during attempted scooting with SBA, made minimal progress to reach [facility], ultimately needed Max A to scoot towards HOB. Did attempt standing from elevated bed height to RW with total assist and unable to clear buttocks. Pt noted with large BM while seated EOB requiring total assist to return to supine. MIN A to roll to bil sides in bed for linen change and clean up. Total assist for peri-care. Utilized bed in trendelenburg and head board to assist with scooting towards [facility] with Max A x2. Pt left in bed with all needs in place and will cont to require skilled acute OT services to maximize her safety and IND to return to PLOF.       If plan is discharge home, recommend the following:  A lot of help with bathing/dressing/bathroom;Help with stairs or ramp for entrance;Assistance with cooking/housework;Two people to help with walking and/or transfers   Equipment Recommendations  Other (comment) (defer)    Recommendations for Other Services      Precautions / Restrictions Precautions Precautions: Fall Recall of Precautions/Restrictions: Impaired Required Braces or Orthoses: Other Brace (immobilizer for LLE) Other Brace: LLE  immobilizer Restrictions Weight Bearing Restrictions Per Provider Order: No LLE Weight Bearing Per Provider Order: Non weight bearing Other Position/Activity Restrictions: has immobilizer over amputation site       Mobility Bed Mobility Overal bed mobility: Needs Assistance Bed Mobility: Supine to Sit, Sit to Supine Rolling: Min assist, Used rails   Supine to sit: Max assist, Mod assist, Used rails, HOB elevated Sit to supine: Max assist, Total assist   General bed mobility comments: improvement with supine to sit, Max/TOTAL A to return to supine d/t sliding from large BM on sheets    Transfers Overall transfer level: Needs assistance Equipment used: Rolling walker (2 wheels) Transfers: Sit to/from Stand Sit to Stand: Total assist           General transfer comment: attempted STS from EOB with bed height elevated to RW however with total assist unable to clear buttocks; pt better able to scoot laterally towards HOB this date, however increased time to perform and overall Max A still needed for efficiency     Balance Overall balance assessment: Needs assistance Sitting-balance support: Feet supported, Single extremity supported Sitting balance-Leahy Scale: Good Sitting balance - Comments: CGA/SBA seated at EOB   Standing balance support: Reliant on assistive device for balance Standing balance-Leahy Scale: Zero Standing balance comment: unable to stand from elevated bed height despite Max A/total assist                           ADL either performed or assessed with clinical judgement   ADL Overall ADL's : Needs assistance/impaired  Toileting- Clothing Manipulation and Hygiene: Maximal assistance;Bed level;Total assistance Toileting - Clothing Manipulation Details (indicate cue type and reason): via rolling in bed after large BM            Extremity/Trunk Assessment              Vision       Perception      Praxis     Communication Communication Communication: No apparent difficulties   Cognition Arousal: Alert Behavior During Therapy: WFL for tasks assessed/performed                                 Following commands: Impaired Following commands impaired: Follows one step commands with increased time      Cueing   Cueing Techniques: Verbal cues, Visual cues, Tactile cues  Exercises      Shoulder Instructions       General Comments      Pertinent Vitals/ Pain       Pain Assessment Pain Assessment: No/denies pain Pain Intervention(s): Monitored during session, Repositioned  Home Living                                          Prior Functioning/Environment              Frequency  Min 2X/week        Progress Toward Goals  OT Goals(current goals can now be found in the care plan section)  Progress towards OT goals: Progressing toward goals  Acute Rehab OT Goals Patient Stated Goal: improve strength and function OT Goal Formulation: With patient/family Time For Goal Achievement: 03/23/24 Potential to Achieve Goals: Fair  Plan      Co-evaluation                 AM-PAC OT 6 Clicks Daily Activity     Outcome Measure   Help from another person eating meals?: None Help from another person taking care of personal grooming?: A Little Help from another person toileting, which includes using toliet, bedpan, or urinal?: A Lot Help from another person bathing (including washing, rinsing, drying)?: A Lot Help from another person to put on and taking off regular upper body clothing?: A Little Help from another person to put on and taking off regular lower body clothing?: A Lot 6 Click Score: 16    End of Session Equipment Utilized During Treatment: Gait belt;Rolling walker (2 wheels)  OT Visit Diagnosis: Unsteadiness on feet (R26.81);Muscle weakness (generalized) (M62.81);Other abnormalities of gait and mobility  (R26.89)   Activity Tolerance Patient tolerated treatment well   Patient Left in bed;with call bell/phone within reach;with bed alarm set;with family/visitor present   Nurse Communication Mobility status        Time: 1457-1535 OT Time Calculation (min): 38 min  Charges: OT General Charges $OT Visit: 1 Visit OT Treatments $Self Care/Home Management : 8-22 mins $Therapeutic Activity: 23-37 mins  Analeya Luallen, OTR/L  03/12/24, 4:03 PM   Ricka Westra E Margel Joens 03/12/2024, 3:59 PM

## 2024-03-12 NOTE — Progress Notes (Signed)
 [facility]  ROUNDING NOTE   Subjective:   Denise Sharp is a 78 y.o. female with past medical conditions including CAD, hyperlipidemia, hypertension, and chronic kidney disease IIIb. She presents to the ED for evaluation for wound infection and has been admitted for Cellulitis of left foot [L03.116]  Patient is known to our practice and is followed by Dr Dominica. Patient was last seen in office on 01/07/24 for routine follow    Patient sitting up in bed Alert, mentation has improved. Nursing at bedside, assisting with breakfast noted Room air  Creatinine 1.57, same as previous day   Objective:  Vital signs in last 24 hours:  Temp:  [97.4 F (36.3 C)-98.4 F (36.9 C)] 98.4 F (36.9 C) (08/28 0845) Pulse Rate:  [72-80] 76 (08/28 0845) Resp:  [16-18] 16 (08/28 0845) BP: (116-128)/(47-63) 128/63 (08/28 0845) SpO2:  [96 %-98 %] 98 % (08/28 0845) FiO2 (%):  [21 %] 21 % (08/27 1922)  Weight change:  Filed Weights   03/03/24 1826 03/08/24 0825  Weight: 110.3 kg 112.9 kg    Intake/Output: I/O last 3 completed shifts: In: 720 [P.O.:720] Out: 900 [Urine:900]   Intake/Output this shift:  No intake/output data recorded.  Physical Exam: General: NAD  Head: Normocephalic, atraumatic. Moist oral mucosal membranes  Eyes: Anicteric  Lungs:  Clear to auscultation, room air  Heart: Regular rate and rhythm  Abdomen:  Soft, nontender  Extremities:  No peripheral edema.Lt BKA  Neurologic: Awake, alert to self, conversant  Skin: Warm,dry, no rash       Basic Metabolic Panel: Recent Labs  Lab 03/08/24 0549 03/09/24 0422 03/10/24 0440 03/11/24 0437 03/12/24 0549  NA 137 135 136 139 140  K 4.2 3.7 3.9 4.0 4.0  CL 104 102 103 103 106  CO2 24 22 22 23 25   GLUCOSE 100* 93 115* 137* 162*  BUN 29* 33* 30* 25* 25*  CREATININE 1.57* 1.88* 1.54* 1.57* 1.57*  CALCIUM  8.2* 8.5* 8.7* 8.8* 8.3*    Liver Function Tests: No results for input(s): AST, ALT,  ALKPHOS, BILITOT, PROT, ALBUMIN  in the last 168 hours.  No results for input(s): LIPASE, AMYLASE in the last 168 hours. No results for input(s): AMMONIA in the last 168 hours.  CBC: Recent Labs  Lab 03/08/24 0549 03/09/24 0422 03/10/24 0440 03/11/24 0437 03/12/24 0549  WBC 13.8* 15.5* 15.1* 15.3* 13.9*  HGB 8.8* 8.4* 9.0* 8.7* 8.9*  HCT 27.5* 25.9* 28.5* 27.6* 28.8*  MCV 90.5 89.9 90.8 90.2 90.3  PLT 347 348 408* 432* 445*    Cardiac Enzymes: No results for input(s): CKTOTAL, CKMB, CKMBINDEX, TROPONINI in the last 168 hours.  BNP: Invalid input(s): POCBNP  CBG: Recent Labs  Lab 03/11/24 0800 03/11/24 1229 03/11/24 1710 03/11/24 2129 03/12/24 0843  GLUCAP 151* 155* 137* 146* 151*    Microbiology: Results for orders placed or performed during the hospital encounter of 03/03/24  Blood culture (routine x 2)     Status: Abnormal   Collection Time: 03/03/24  5:01 PM   Specimen: BLOOD RIGHT FOREARM  Result Value Ref Range Status   Specimen Description   Final    BLOOD RIGHT FOREARM Performed at [facility], [address], [place], [place] [address]    Special Requests   Final    BOTTLES DRAWN AEROBIC AND ANAEROBIC Blood Culture results may not be optimal due to an inadequate volume of blood received in culture bottles Performed at [facility], [address]  [place], [place] [address]    Culture  Setup Time   Final    GRAM POSITIVE COCCI ANAEROBIC BOTTLE ONLY CRITICAL RESULT CALLED TO, READ BACK BY AND VERIFIED WITH: TIFFANY GILCHRIST, PHARMD AT 1010 ON 03/04/24 GM    Culture (A)  Final    STAPHYLOCOCCUS HOMINIS THE SIGNIFICANCE OF ISOLATING THIS ORGANISM FROM A SINGLE SET OF BLOOD CULTURES WHEN MULTIPLE SETS ARE DRAWN IS UNCERTAIN. PLEASE NOTIFY THE MICROBIOLOGY DEPARTMENT WITHIN ONE WEEK IF SPECIATION AND SENSITIVITIES ARE REQUIRED. Performed at [facility], [address] [place], [place] [address]    Report  Status 03/06/2024 FINAL  Final  Blood Culture ID Panel (Reflexed)     Status: Abnormal   Collection Time: 03/03/24  5:01 PM  Result Value Ref Range Status   Enterococcus faecalis NOT DETECTED NOT DETECTED Final   Enterococcus Faecium NOT DETECTED NOT DETECTED Final   Listeria monocytogenes NOT DETECTED NOT DETECTED Final   Staphylococcus species DETECTED (A) NOT DETECTED Final    Comment: CRITICAL RESULT CALLED TO, READ BACK BY AND VERIFIED WITH: TIFFANY GILCHRIST, PHARMD AT 1010 ON 03/04/24 GM    Staphylococcus aureus (BCID) NOT DETECTED NOT DETECTED Final   Staphylococcus epidermidis NOT DETECTED NOT DETECTED Final   Staphylococcus lugdunensis NOT DETECTED NOT DETECTED Final   Streptococcus species NOT DETECTED NOT DETECTED Final   Streptococcus agalactiae NOT DETECTED NOT DETECTED Final   Streptococcus pneumoniae NOT DETECTED NOT DETECTED Final   Streptococcus pyogenes NOT DETECTED NOT DETECTED Final   A.calcoaceticus-baumannii NOT DETECTED NOT DETECTED Final   Bacteroides fragilis NOT DETECTED NOT DETECTED Final   Enterobacterales NOT DETECTED NOT DETECTED Final   Enterobacter cloacae complex NOT DETECTED NOT DETECTED Final   Escherichia coli NOT DETECTED NOT DETECTED Final   Klebsiella aerogenes NOT DETECTED NOT DETECTED Final   Klebsiella oxytoca NOT DETECTED NOT DETECTED Final   Klebsiella pneumoniae NOT DETECTED NOT DETECTED Final   Proteus species NOT DETECTED NOT DETECTED Final   Salmonella species NOT DETECTED NOT DETECTED Final   Serratia marcescens NOT DETECTED NOT DETECTED Final   Haemophilus influenzae NOT DETECTED NOT DETECTED Final   Neisseria meningitidis NOT DETECTED NOT DETECTED Final   Pseudomonas aeruginosa NOT DETECTED NOT DETECTED Final   Stenotrophomonas maltophilia NOT DETECTED NOT DETECTED Final   Candida albicans NOT DETECTED NOT DETECTED Final   Candida auris NOT DETECTED NOT DETECTED Final   Candida glabrata NOT DETECTED NOT DETECTED Final   Candida  krusei NOT DETECTED NOT DETECTED Final   Candida parapsilosis NOT DETECTED NOT DETECTED Final   Candida tropicalis NOT DETECTED NOT DETECTED Final   Cryptococcus neoformans/gattii NOT DETECTED NOT DETECTED Final    Comment: Performed at [facility], [address] [place], [place] [address]  Blood culture (routine x 2)     Status: None   Collection Time: 03/03/24  7:01 PM   Specimen: BLOOD  Result Value Ref Range Status   Specimen Description BLOOD BLOOD RIGHT HAND  Final   Special Requests   Final    BOTTLES DRAWN AEROBIC AND ANAEROBIC Blood Culture results may not be optimal due to an inadequate volume of blood received in culture bottles   Culture   Final    NO GROWTH 5 DAYS Performed at [facility], [address] [place], [place] [address]    Report Status 03/08/2024 FINAL  Final  Culture, blood (Routine X 2) w Reflex to ID Panel     Status: None (Preliminary result)  Collection Time: 03/10/24  1:31 PM   Specimen: BLOOD  Result Value Ref Range Status   Specimen Description BLOOD BLOOD RIGHT HAND  Final   Special Requests   Final    BOTTLES DRAWN AEROBIC ONLY Blood Culture results may not be optimal due to an inadequate volume of blood received in culture bottles   Culture   Final    NO GROWTH < 24 HOURS Performed at [facility], [address], [place], [place] [address]    Report Status PENDING  Incomplete  Culture, blood (Routine X 2) w Reflex to ID Panel     Status: None (Preliminary result)   Collection Time: 03/10/24  3:07 PM   Specimen: BLOOD  Result Value Ref Range Status   Specimen Description BLOOD BLOOD RIGHT HAND  Final   Special Requests   Final    BOTTLES DRAWN AEROBIC AND ANAEROBIC Blood Culture adequate volume   Culture   Final    NO GROWTH < 24 HOURS Performed at [facility], [address] [place], [place] [address]    Report Status PENDING  Incomplete   *Note: Due to a large number of results and/or  encounters for the requested time period, some results have not been displayed. A complete set of results can be found in Results Review.    Coagulation Studies: No results for input(s): LABPROT, INR in the last 72 hours.  Urinalysis: Recent Labs    03/10/24 1306  COLORURINE YELLOW*  LABSPEC 1.012  PHURINE 5.0  GLUCOSEU >=500*  HGBUR SMALL*  BILIRUBINUR NEGATIVE  KETONESUR 5*  PROTEINUR 30*  NITRITE NEGATIVE  LEUKOCYTESUR NEGATIVE      Imaging: DG Chest 1 View Result Date: 03/10/2024 CLINICAL DATA:  [id number] PNA (pneumonia) [id number] EXAM: CHEST  1 VIEW COMPARISON:  Chest x-ray 03/28/2023. FINDINGS: The heart and mediastinal contours are unchanged. Atherosclerotic plaque. No focal consolidation. No pulmonary edema. No pleural effusion. No pneumothorax. No acute osseous abnormality.  Reversed right shoulder arthroplasty. IMPRESSION: No active disease. Electronically Signed   By: Morgane  Naveau M.D.   On: 03/10/2024 17:15     Medications:    sodium chloride  50 mL/hr at 03/12/24 9446   piperacillin -tazobactam (ZOSYN )  IV 3.375 g (03/12/24 0556)    allopurinol   100 mg Oral Daily   apixaban   5 mg Oral BID   atorvastatin   80 mg Oral QHS   colchicine   0.6 mg Oral Daily   dapagliflozin  propanediol  10 mg Oral Daily   docusate sodium   100 mg Oral Daily   ezetimibe   10 mg Oral Daily   feeding supplement  237 mL Oral BID BM   Finerenone   10 mg Oral q AM   fluticasone  furoate-vilanterol  1 puff Inhalation Daily   insulin  aspart  0-15 Units Subcutaneous TID WC   insulin  aspart  0-5 Units Subcutaneous QHS   pantoprazole   40 mg Oral Daily   pregabalin   25 mg Oral QHS   acetaminophen , fluticasone , HYDROmorphone  (DILAUDID ) injection, lidocaine , nitroGLYCERIN , nystatin , oxyCODONE , polyethylene glycol, senna-docusate  Assessment/ Plan:  Denise Sharp is a 78 y.o.  female with past medical conditions including CAD, hyperlipidemia, hypertension, and chronic kidney disease IIIb.  She presents to the ED for evaluation for wound infection and has been admitted for Cellulitis of left foot [L03.116]   Acute Kidney Injury on chronic kidney disease stage IIIb with baseline creatinine 1.2 and GFR of 45.  Acute kidney injury secondary to infection and hypotension.  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   Remained stable today.  Vomiting 50 mL of urine recorded overnight.  No acute indication for dialysis.  Continue IV hydration, can consider stopping once oral intake improves or respiratory status decreases.  Lab Results  Component Value Date   CREATININE 1.57 (H) 03/12/2024   CREATININE 1.57 (H) 03/11/2024   CREATININE 1.54 (H) 03/10/2024    Intake/Output Summary (Last 24 hours) at 03/12/2024 1044 Last data filed at 03/11/2024 2230 Gross per 24 hour  Intake 540 ml  Output 550 ml  Net -10 ml    2. Anemia of chronic kidney disease Lab Results  Component Value Date   HGB 8.9 (L) 03/12/2024    Hgb borderline but has improved. Will monitor for need of ESA.   3. Secondary Hyperparathyroidism: with outpatient labs: PTH 149, phosphorus 4.5, calcium  8.9 on 09/30/23.   Lab Results  Component Value Date   CALCIUM  8.3 (L) 03/12/2024   CAION 1.17 06/04/2022    Will continue to monitor bone minerals.   4. Cellulitis, left foot wound with possible osteomyelitis. 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 Was on  Cephalexin , but transitioned to Zosyn    LOS: 9 Maiah Sinning 8/28/202510:44 AM

## 2024-03-12 NOTE — Progress Notes (Signed)
 Progress Note    03/12/2024 10:03 AM 5 Days Post-Op  Subjective:   Terryann Verbeek is a 78 yo female now POD #3 from Left Below the knee amputation.  Patient is recovering as expected.  Patient appears to be much clearer this morning.  Answering questions and following commands.  Patient concerned about having a bowel movement this morning.  Husband is at the bedside.    Vitals:   03/12/24 0404 03/12/24 0845  BP: 123/60 128/63  Pulse: 72 76  Resp: 18 16  Temp: (!) 97.4 F (36.3 C) 98.4 F (36.9 C)  SpO2: 96% 98%   Physical Exam: Cardiac:  RRR, normal S1 and S2.  No murmurs. Lungs: Nonlabored breathing, clear throughout on auscultation but diminished in the bases.  Without rales rhonchi or wheezing. Incisions: Left lower extremity below the knee amputation.  Dressing clean dry and intact. Extremities: Right lower extremity is warm to touch with palpable pulses.  Left lower extremity with new below the knee amputation.  Dressings clean dry and intact. Abdomen: Positive bowel sounds throughout, soft, nontender nondistended. Neurologic: Patient remains very confused today.  Unable to answer any of my questions or follow commands.  CBC    Component Value Date/Time   WBC 13.9 (H) 03/12/2024 0549   RBC 3.19 (L) 03/12/2024 0549   HGB 8.9 (L) 03/12/2024 0549   HGB 11.2 (L) 11/28/2023 1307   HGB 13.1 06/21/2014 1349   HCT 28.8 (L) 03/12/2024 0549   HCT 41.4 06/21/2014 1349   PLT 445 (H) 03/12/2024 0549   PLT 396 11/28/2023 1307   PLT 308 06/21/2014 1349   MCV 90.3 03/12/2024 0549   MCV 88 06/21/2014 1349   MCH 27.9 03/12/2024 0549   MCHC 30.9 03/12/2024 0549   RDW 15.5 03/12/2024 0549   RDW 15.4 (H) 06/21/2014 1349   LYMPHSABS 2.8 03/03/2024 1452   LYMPHSABS 2.3 02/25/2014 2153   MONOABS 0.9 03/03/2024 1452   MONOABS 0.6 02/25/2014 2153   EOSABS 0.1 03/03/2024 1452   EOSABS 0.1 02/25/2014 2153   BASOSABS 0.2 (H) 03/03/2024 1452   BASOSABS 0.1 02/25/2014 2153    BMET     Component Value Date/Time   NA 140 03/12/2024 0549   NA 137 10/21/2023 1417   NA 141 06/21/2014 1349   K 4.0 03/12/2024 0549   K 4.0 06/21/2014 1349   CL 106 03/12/2024 0549   CL 106 06/21/2014 1349   CO2 25 03/12/2024 0549   CO2 24 06/21/2014 1349   GLUCOSE 162 (H) 03/12/2024 0549   GLUCOSE 174 (H) 06/21/2014 1349   BUN 25 (H) 03/12/2024 0549   BUN 30 (H) 10/21/2023 1417   BUN 12 06/21/2014 1349   CREATININE 1.57 (H) 03/12/2024 0549   CREATININE 1.30 (H) 01/24/2024 1009   CALCIUM  8.3 (L) 03/12/2024 0549   CALCIUM  8.7 06/21/2014 1349   GFRNONAA 34 (L) 03/12/2024 0549   GFRNONAA 41 (L) 11/28/2023 1308   GFRNONAA 57 (L) 06/21/2014 1349   GFRNONAA 55 (L) 02/25/2014 2153   GFRNONAA 70 12/13/2011 0919   GFRAA >60 03/23/2020 1204   GFRAA >60 06/21/2014 1349   GFRAA >60 02/25/2014 2153   GFRAA 80 12/13/2011 0919    INR    Component Value Date/Time   INR 1.6 (H) 02/01/2024 0414     Intake/Output Summary (Last 24 hours) at 03/12/2024 1003 Last data filed at 03/11/2024 2230 Gross per 24 hour  Intake 540 ml  Output 550 ml  Net -10 ml  Assessment/Plan:  78 y.o. female is s/p Left Below the knee amputation 5 Days Post-Op   Plan  Patient is recovering as expected.  Dressing to left lower extremity clean dry and intact.  No drainage noted.  Ace bandage securely in place as well as knee immobilizer to help with contracture. Okay for patient to discharge to rehab once medically stable and recovered.   DVT prophylaxis: Eliquis  5 mg twice daily.   Gwendlyn JONELLE [facility]ank [facility] 03/12/2024 10:03 AM

## 2024-03-12 NOTE — Plan of Care (Signed)
 SABRA

## 2024-03-13 ENCOUNTER — Encounter: Admitting: Family

## 2024-03-13 ENCOUNTER — Telehealth: Payer: Self-pay | Admitting: Family

## 2024-03-13 DIAGNOSIS — I96 Gangrene, not elsewhere classified: Secondary | ICD-10-CM | POA: Diagnosis not present

## 2024-03-13 DIAGNOSIS — Z89512 Acquired absence of left leg below knee: Secondary | ICD-10-CM | POA: Diagnosis not present

## 2024-03-13 LAB — BASIC METABOLIC PANEL WITH GFR
Anion gap: 10 (ref 5–15)
BUN: 21 mg/dL (ref 8–23)
CO2: 25 mmol/L (ref 22–32)
Calcium: 8.2 mg/dL — ABNORMAL LOW (ref 8.9–10.3)
Chloride: 106 mmol/L (ref 98–111)
Creatinine, Ser: 1.63 mg/dL — ABNORMAL HIGH (ref 0.44–1.00)
GFR, Estimated: 32 mL/min — ABNORMAL LOW (ref >60)
Glucose, Bld: 128 mg/dL — ABNORMAL HIGH (ref 70–99)
Potassium: 3.4 mmol/L — ABNORMAL LOW (ref 3.5–5.1)
Sodium: 141 mmol/L (ref 135–145)

## 2024-03-13 LAB — CBC
HCT: 27.7 % — ABNORMAL LOW (ref 36.0–46.0)
Hemoglobin: 8.6 g/dL — ABNORMAL LOW (ref 12.0–15.0)
MCH: 28.3 pg (ref 26.0–34.0)
MCHC: 31 g/dL (ref 30.0–36.0)
MCV: 91.1 fL (ref 80.0–100.0)
Platelets: 456 K/uL — ABNORMAL HIGH (ref 150–400)
RBC: 3.04 MIL/uL — ABNORMAL LOW (ref 3.87–5.11)
RDW: 15.6 % — ABNORMAL HIGH (ref 11.5–15.5)
WBC: 13.2 K/uL — ABNORMAL HIGH (ref 4.0–10.5)
nRBC: 0 % (ref 0.0–0.2)

## 2024-03-13 LAB — GLUCOSE, CAPILLARY
Glucose-Capillary: 121 mg/dL — ABNORMAL HIGH (ref 70–99)
Glucose-Capillary: 235 mg/dL — ABNORMAL HIGH (ref 70–99)
Glucose-Capillary: 135 mg/dL — ABNORMAL HIGH (ref 70–99)
Glucose-Capillary: 116 mg/dL — ABNORMAL HIGH (ref 70–99)

## 2024-03-13 MED ORDER — POTASSIUM CHLORIDE 20 MEQ PO PACK
40.0000 meq | PACK | Freq: Once | ORAL | Status: AC
  Administered 2024-03-13: 40 meq via ORAL
  Filled 2024-03-13: qty 2

## 2024-03-13 MED ORDER — OXYCODONE HCL 5 MG PO TABS
5.0000 mg | ORAL_TABLET | Freq: Three times a day (TID) | ORAL | Status: AC | PRN
  Administered 2024-03-13 – 2024-03-14 (×3): 5 mg via ORAL
  Filled 2024-03-13 (×3): qty 1

## 2024-03-13 MED ORDER — LOPERAMIDE HCL 2 MG PO CAPS
4.0000 mg | ORAL_CAPSULE | Freq: Once | ORAL | Status: AC
  Administered 2024-03-13: 4 mg via ORAL
  Filled 2024-03-13: qty 2

## 2024-03-13 MED ORDER — SACCHAROMYCES BOULARDII 250 MG PO CAPS: 250.0000 mg | ORAL_CAPSULE | Freq: Two times a day (BID) | ORAL | Status: DC

## 2024-03-13 MED ORDER — LOPERAMIDE HCL 2 MG PO CAPS
2.0000 mg | ORAL_CAPSULE | ORAL | Status: DC | PRN
  Administered 2024-03-13: 2 mg via ORAL
  Filled 2024-03-13: qty 1

## 2024-03-13 NOTE — Progress Notes (Signed)
 Progress Note    03/13/2024 8:47 AM 6 Days Post-Op  Subjective:  Denise Sharp is a 78 yo female now POD #6 from Left Below the knee amputation.  Patient is recovering as expected.  Patient appears to be much clearer this morning.  Answering questions and following commands.  Patient concerned about having a diarrhea after given laxatives for constipation.  Husband is at the bedside.    Vitals:   03/12/24 1942 03/13/24 0427  BP: (!) 106/38 (!) 102/37  Pulse: 76 73  Resp: 15 16  Temp: 98.1 F (36.7 C) 98.5 F (36.9 C)  SpO2: 96% 96%   Physical Exam: Cardiac:  RRR, normal S1 and S2.  No murmurs. Lungs: Nonlabored breathing, clear throughout on auscultation but diminished in the bases.  Without rales rhonchi or wheezing. Incisions: Left lower extremity below the knee amputation.  Dressing clean dry and intact. Extremities: Right lower extremity is warm to touch with palpable pulses.  Left lower extremity with new below the knee amputation.  Dressings clean dry and intact. Abdomen: Positive bowel sounds throughout, soft, nontender nondistended. Neurologic: Patient remains very confused today.  Unable to answer any of my questions or follow commands.  CBC    Component Value Date/Time   WBC 13.2 (H) 03/13/2024 0636   RBC 3.04 (L) 03/13/2024 0636   HGB 8.6 (L) 03/13/2024 0636   HGB 11.2 (L) 11/28/2023 1307   HGB 13.1 06/21/2014 1349   HCT 27.7 (L) 03/13/2024 0636   HCT 41.4 06/21/2014 1349   PLT 456 (H) 03/13/2024 0636   PLT 396 11/28/2023 1307   PLT 308 06/21/2014 1349   MCV 91.1 03/13/2024 0636   MCV 88 06/21/2014 1349   MCH 28.3 03/13/2024 0636   MCHC 31.0 03/13/2024 0636   RDW 15.6 (H) 03/13/2024 0636   RDW 15.4 (H) 06/21/2014 1349   LYMPHSABS 2.8 03/03/2024 1452   LYMPHSABS 2.3 02/25/2014 2153   MONOABS 0.9 03/03/2024 1452   MONOABS 0.6 02/25/2014 2153   EOSABS 0.1 03/03/2024 1452   EOSABS 0.1 02/25/2014 2153   BASOSABS 0.2 (H) 03/03/2024 1452   BASOSABS 0.1  02/25/2014 2153    BMET    Component Value Date/Time   NA 141 03/13/2024 0636   NA 137 10/21/2023 1417   NA 141 06/21/2014 1349   K 3.4 (L) 03/13/2024 0636   K 4.0 06/21/2014 1349   CL 106 03/13/2024 0636   CL 106 06/21/2014 1349   CO2 25 03/13/2024 0636   CO2 24 06/21/2014 1349   GLUCOSE 128 (H) 03/13/2024 0636   GLUCOSE 174 (H) 06/21/2014 1349   BUN 21 03/13/2024 0636   BUN 30 (H) 10/21/2023 1417   BUN 12 06/21/2014 1349   CREATININE 1.63 (H) 03/13/2024 0636   CREATININE 1.30 (H) 01/24/2024 1009   CALCIUM  8.2 (L) 03/13/2024 0636   CALCIUM  8.7 06/21/2014 1349   GFRNONAA 32 (L) 03/13/2024 0636   GFRNONAA 41 (L) 11/28/2023 1308   GFRNONAA 57 (L) 06/21/2014 1349   GFRNONAA 55 (L) 02/25/2014 2153   GFRNONAA 70 12/13/2011 0919   GFRAA >60 03/23/2020 1204   GFRAA >60 06/21/2014 1349   GFRAA >60 02/25/2014 2153   GFRAA 80 12/13/2011 0919    INR    Component Value Date/Time   INR 1.6 (H) 02/01/2024 0414     Intake/Output Summary (Last 24 hours) at 03/13/2024 0847 Last data filed at 03/13/2024 0302 Gross per 24 hour  Intake 300 ml  Output 900 ml  Net -600  ml     Assessment/Plan:  78 y.o. female is s/p Left Below the knee amputation  6 Days Post-Op   Plan  Patient is recovering as expected.  Dressing to left lower extremity clean dry and intact.  No drainage noted.  Ace bandage securely in place as well as knee immobilizer to help with contracture. Okay for patient to discharge to rehab once medically stable and recovered.   DVT prophylaxis: Eliquis  5 mg twice daily    [facility] 03/13/2024 8:47 AM

## 2024-03-13 NOTE — Progress Notes (Signed)
 Progress Note    Denise Sharp  FMW:[id number] DOB: [birth year]  DOA: 03/03/2024 PCP: Marylynn Verneita CROME, MD      Brief Narrative:    Medical records reviewed and are as summarized below:  Denise Sharp is a 78 y.o. female with hypertension, hyperlipidemia, CAD, anxiety, asthma, CKD, who presents from urology for chief concern of worsening wounds of the left foot, reports low grade fever at home, chills and increasing pain with discharge of her left foot. Progressive charcoaled arthropathy vs superimposed OM as per XR. MRI with worsening Charot joint and possible OM, Podiatry recommend Left BKA which patient underwent on 08/23 by Vascular Surgery      Assessment/Plan:   Principal Problem:   Gangrene of left foot (HCC) Active Problems:   SIRS (systemic inflammatory response syndrome) (HCC)   Type II diabetes mellitus with neurological manifestations (HCC)   OSA on CPAP   Iron  deficiency anemia   Obesity hypoventilation syndrome (HCC)   Coronary artery disease of native artery of native heart with stable angina pectoris (HCC)   Persistent atrial fibrillation (HCC)   Restless legs syndrome   Morbid obesity (HCC)   Hypersomnia, persistent   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 Met SIRS criteria on adm (fever, leukocytosis, lactic acidosis) - Bcx 1/4: GPC (Staph species), likely contaminant  - XR Left foot  advanced osseous destructive changes and fragmentation of the midfoot, consistent with Charcot arthropathy.  Progressive charco arthropathy vs superimposed OM - MRI Left foot shows severe Charcot changes, findings concerning for early OM vs reactive changes throughout talus. No evidence of abscess - Seen by Podiatry, who recommended left BKA - Seen by Vascular surgery, s/p Left BKA 08/23, pathology sent Discontinue IV Dilaudid  use oxycodone  as needed for pain.   LUE cellulitis/gout involving the left hand (painful swelling, erythema of dorsal aspect of left hand)- improving -Discontinue IV Zosyn  - Continue colchicine  and allopurinol .    Acute toxic/metabolic encephalopathy on 08/24 Mental status has improved. - Suspect multifactorial due to medication (opioids, hydroxyzine , zanaflex ), hospital acquired deirium, AKI on CKD - No acute abnormality on CT head on 03/09/2023 -Zanaflex , Hydroxyzine , Opioids, Cymbalta , IV morphine  have been discontinued. Continue Lyrica  Urinalysis 120/2025 negative for UTI.  (patient has recent UTI prior to adm, also leukocytosis)   Left knee pain s/p Total left knee arthroplasty - resolved - XR No acute fracture or dislocation - Pain management    AKI on CKD stage 3a baseline appears 1.1-1.3,  Creatinine down from 1.88-1.57-1.57-1.63 Monitor creatinine off of IV fluids   Hypokalemia Replete potassium.    Hypotension-improved Hx HTN S/p IV albumin  and IV fluids   Persistent atrial fibrillation (HCC) - Continue Eliquis     NICM with preserved ejection fraction Torsemide  on hold    Type II diabetes mellitus with neurological manifestations (HCC) Home long acting insulin , hold Lantus  25u - poor po intake due  to AMS and AKI Insulin  SSI with HS coverage Goal inpatient blood glucose level 140-180     History of gout Continue colchicine  and allopurinol      Obesity hypoventilation syndrome (HCC) Cpap at bedtime ordered    Weakness Fall  precaution PT/OT rec SNF when ready   Constipation Improved.  She had multiple watery stools. She has history of chronic diarrhea and fecal incontinence and requested that scheduled laxatives be discontinued and also requested Imodium  to be used as needed..    Comorbidities include generalized anxiety disorder, major depressive, hyperlipidemia, iron  deficiency anemia (periodic iron  infusion in the outpatient setting)   Plan discussed with her husband at the bedside. Husband feels that patient is not ready to go to SNF because she is very weak and has not been able to do much with PT.    Diet Order             Diet heart healthy/carb modified Room service appropriate? Yes; Fluid consistency: Thin  Diet effective now                                  Consultants: Podiatrist Vascular surgeon  Procedures: Left below-knee amputation on 03/08/2023    Medications:    allopurinol   100 mg Oral Daily   apixaban   5 mg Oral BID   atorvastatin   80 mg Oral QHS   colchicine   0.6 mg Oral Daily   dapagliflozin  propanediol  10 mg Oral Daily   ezetimibe   10 mg Oral Daily   feeding supplement  237 mL Oral BID BM   Finerenone   10 mg Oral q AM   fluticasone  furoate-vilanterol  1 puff Inhalation Daily   insulin  aspart  0-15 Units Subcutaneous TID WC   insulin  aspart  0-5 Units Subcutaneous QHS   pantoprazole   40 mg Oral Daily   pregabalin   25 mg Oral QHS   senna-docusate  1 tablet Oral BID   Continuous Infusions:  piperacillin -tazobactam (ZOSYN )  IV 3.375 g (03/13/24 1344)     Anti-infectives (From admission, onward)    Start     Dose/Rate Route Frequency Ordered Stop   03/10/24 1430  piperacillin -tazobactam (ZOSYN ) IVPB 3.375 g        3.375 g 12.5 mL/hr over 240 Minutes Intravenous Every 8 hours 03/10/24 1337     03/08/24 1800  cephALEXin  (KEFLEX ) capsule 500 mg  Status:  Discontinued        500 mg Oral Every 6 hours 03/08/24 1419 03/10/24 1337   03/06/24  0500  ceFEPIme  (MAXIPIME ) 2 g in sodium chloride  0.9 % 100 mL IVPB  Status:  Discontinued        2 g 200 mL/hr over 30 Minutes Intravenous Every 12 hours 03/05/24 2309 03/08/24 1419   03/05/24 1700  vancomycin  (VANCOCIN ) IVPB 1000 mg/200 mL premix  Status:  Discontinued        1,000 mg 200 mL/hr over 60 Minutes Intravenous Every 24 hours 03/05/24 0813 03/08/24 0828   03/05/24 1200  ceFEPIme  (MAXIPIME ) 2 g in sodium chloride  0.9 % 100 mL IVPB  Status:  Discontinued        2 g 200 mL/hr over 30 Minutes Intravenous 2 times daily 03/05/24 1128 03/05/24 2309   03/04/24 1700  vancomycin  (VANCOREADY) IVPB 1000 mg/200 mL  Status:  Discontinued        1,000 mg 200 mL/hr over 60 Minutes Intravenous Every 24 hours 03/03/24 1738  03/05/24 0813   03/03/24 1730  vancomycin  (VANCOREADY) IVPB 2000 mg/400 mL        2,000 mg 200 mL/hr over 120 Minutes Intravenous  Once 03/03/24 1729 03/04/24 0534   03/03/24 1645  vancomycin  (VANCOCIN ) IVPB 1000 mg/200 mL premix  Status:  Discontinued        1,000 mg 200 mL/hr over 60 Minutes Intravenous  Once 03/03/24 1635 03/03/24 1729              Family Communication/Anticipated D/C date and plan/Code Status   DVT prophylaxis: Place TED hose Start: 03/03/24 1712 apixaban  (ELIQUIS ) tablet 5 mg     Code Status: Full Code  Family Communication: Plan discussed with husband at the bedside.  Disposition Plan: Plan to discharge to SNF   Status is: Inpatient Remains inpatient appropriate because: Altered mental status, s/p left ABA       Subjective:   Interval events noted.  She complains of diarrhea.  She said she is normally incontinent of feces.  She said even though she has been constipated for days, she believes she is now getting back to her normal bowel habits which is usually hyperdefecation and fecal incontinence.  She requested her scheduled laxatives to be discontinued.  Her husband was at the bedside.  Objective:    Vitals:   03/12/24 1942  03/13/24 0427 03/13/24 0906 03/13/24 1543  BP: (!) 106/38 (!) 102/37 116/64 (!) 110/43  Pulse: 76 73 77 67  Resp: 15 16 20 16   Temp: 98.1 F (36.7 C) 98.5 F (36.9 C)  98.2 F (36.8 C)  TempSrc: Oral Oral    SpO2: 96% 96% 98% 98%  Weight:      Height:       No data found.   Intake/Output Summary (Last 24 hours) at 03/13/2024 1559 Last data filed at 03/13/2024 1545 Gross per 24 hour  Intake 540 ml  Output 1400 ml  Net -860 ml   Filed Weights   03/03/24 1826 03/08/24 0825  Weight: 110.3 kg 112.9 kg    Exam:  GEN: NAD SKIN: Warm and dry EYES: No pallor or icterus ENT: MMM CV: RRR PULM: CTA B ABD: soft, obese, NT, +BS CNS: AAO x 3, non focal EXT: Erythema, tenderness and swelling on dorsal aspect of left hand has improved.  Left BKA.        Data Reviewed:   I have personally reviewed following labs and imaging studies:  Labs: Labs show the following:   Basic Metabolic Panel: Recent Labs  Lab 03/09/24 0422 03/10/24 0440 03/11/24 0437 03/12/24 0549 03/13/24 0636  NA 135 136 139 140 141  K 3.7 3.9 4.0 4.0 3.4*  CL 102 103 103 106 106  CO2 22 22 23 25 25   GLUCOSE 93 115* 137* 162* 128*  BUN 33* 30* 25* 25* 21  CREATININE 1.88* 1.54* 1.57* 1.57* 1.63*  CALCIUM  8.5* 8.7* 8.8* 8.3* 8.2*   GFR Estimated Creatinine Clearance: 35 mL/min (A) (by C-G formula based on SCr of 1.63 mg/dL (H)). Liver Function Tests: No results for input(s): AST, ALT, ALKPHOS, BILITOT, PROT, ALBUMIN  in the last 168 hours. No results for input(s): LIPASE, AMYLASE in the last 168 hours. No results for input(s): AMMONIA in the last 168 hours. Coagulation profile No results for input(s): INR, PROTIME in the last 168 hours.  CBC: Recent Labs  Lab 03/09/24 0422 03/10/24 0440 03/11/24 0437 03/12/24 0549 03/13/24 0636  WBC 15.5* 15.1* 15.3* 13.9* 13.2*  HGB 8.4* 9.0* 8.7* 8.9*  8.6*  HCT 25.9* 28.5* 27.6* 28.8* 27.7*  MCV 89.9 90.8 90.2 90.3 91.1  PLT  348 408* 432* 445* 456*   Cardiac Enzymes: No results for input(s): CKTOTAL, CKMB, CKMBINDEX, TROPONINI in the last 168 hours. BNP (last 3 results) No results for input(s): PROBNP in the last 8760 hours. CBG: Recent Labs  Lab 03/12/24 1152 03/12/24 1626 03/12/24 2015 03/13/24 0752 03/13/24 1159  GLUCAP 158* 169* 120* 121* 235*   D-Dimer: No results for input(s): DDIMER in the last 72 hours. Hgb A1c: No results for input(s): HGBA1C in the last 72 hours. Lipid Profile: No results for input(s): CHOL, HDL, LDLCALC, TRIG, CHOLHDL, LDLDIRECT in the last 72 hours. Thyroid  function studies: No results for input(s): TSH, T4TOTAL, T3FREE, THYROIDAB in the last 72 hours.  Invalid input(s): FREET3 Anemia work up: Recent Labs    03/12/24 0549  FERRITIN 2,782*  TIBC 113*  IRON  33   Sepsis Labs: Recent Labs  Lab 03/10/24 0440 03/11/24 0437 03/12/24 0549 03/13/24 0636  WBC 15.1* 15.3* 13.9* 13.2*    Microbiology Recent Results (from the past 240 hours)  Blood culture (routine x 2)     Status: Abnormal   Collection Time: 03/03/24  5:01 PM   Specimen: BLOOD RIGHT FOREARM  Result Value Ref Range Status   Specimen Description   Final    BLOOD RIGHT FOREARM Performed at [facility], [address] [place], [place] [address]    Special Requests   Final    BOTTLES DRAWN AEROBIC AND ANAEROBIC Blood Culture results may not be optimal due to an inadequate volume of blood received in culture bottles Performed at [facility], [address] [place], [place] [address]    Culture  Setup Time   Final    GRAM POSITIVE COCCI ANAEROBIC BOTTLE ONLY CRITICAL RESULT CALLED TO, READ BACK BY AND VERIFIED WITH: TIFFANY GILCHRIST, PHARMD AT 1010 ON 03/04/24 GM    Culture (A)  Final    STAPHYLOCOCCUS HOMINIS THE SIGNIFICANCE OF ISOLATING THIS ORGANISM FROM A SINGLE SET OF BLOOD CULTURES WHEN MULTIPLE SETS ARE DRAWN IS UNCERTAIN. PLEASE  NOTIFY THE MICROBIOLOGY DEPARTMENT WITHIN ONE WEEK IF SPECIATION AND SENSITIVITIES ARE REQUIRED. Performed at [facility], [address], [place], [place] [address]    Report Status 03/06/2024 FINAL  Final  Blood Culture ID Panel (Reflexed)     Status: Abnormal   Collection Time: 03/03/24  5:01 PM  Result Value Ref Range Status   Enterococcus faecalis NOT DETECTED NOT DETECTED Final   Enterococcus Faecium NOT DETECTED NOT DETECTED Final   Listeria monocytogenes NOT DETECTED NOT DETECTED Final   Staphylococcus species DETECTED (A) NOT DETECTED Final    Comment: CRITICAL RESULT CALLED TO, READ BACK BY AND VERIFIED WITH: TIFFANY GILCHRIST, PHARMD AT 1010 ON 03/04/24 GM    Staphylococcus aureus (BCID) NOT DETECTED NOT DETECTED Final   Staphylococcus epidermidis NOT DETECTED NOT DETECTED Final   Staphylococcus lugdunensis NOT DETECTED NOT DETECTED Final   Streptococcus species NOT DETECTED NOT DETECTED Final   Streptococcus agalactiae NOT DETECTED NOT DETECTED Final   Streptococcus pneumoniae NOT DETECTED NOT DETECTED Final   Streptococcus pyogenes NOT DETECTED NOT DETECTED Final   A.calcoaceticus-baumannii NOT DETECTED NOT DETECTED Final   Bacteroides fragilis NOT DETECTED NOT DETECTED Final   Enterobacterales NOT DETECTED NOT DETECTED Final   Enterobacter cloacae complex NOT DETECTED NOT DETECTED Final   Escherichia coli NOT DETECTED NOT DETECTED Final   Klebsiella aerogenes NOT DETECTED NOT  DETECTED Final   Klebsiella oxytoca NOT DETECTED NOT DETECTED Final   Klebsiella pneumoniae NOT DETECTED NOT DETECTED Final   Proteus species NOT DETECTED NOT DETECTED Final   Salmonella species NOT DETECTED NOT DETECTED Final   Serratia marcescens NOT DETECTED NOT DETECTED Final   Haemophilus influenzae NOT DETECTED NOT DETECTED Final   Neisseria meningitidis NOT DETECTED NOT DETECTED Final   Pseudomonas aeruginosa NOT DETECTED NOT DETECTED Final   Stenotrophomonas maltophilia NOT DETECTED  NOT DETECTED Final   Candida albicans NOT DETECTED NOT DETECTED Final   Candida auris NOT DETECTED NOT DETECTED Final   Candida glabrata NOT DETECTED NOT DETECTED Final   Candida krusei NOT DETECTED NOT DETECTED Final   Candida parapsilosis NOT DETECTED NOT DETECTED Final   Candida tropicalis NOT DETECTED NOT DETECTED Final   Cryptococcus neoformans/gattii NOT DETECTED NOT DETECTED Final    Comment: Performed at [facility], [address] [place], [place] [address]  Blood culture (routine x 2)     Status: None   Collection Time: 03/03/24  7:01 PM   Specimen: BLOOD  Result Value Ref Range Status   Specimen Description BLOOD BLOOD RIGHT HAND  Final   Special Requests   Final    BOTTLES DRAWN AEROBIC AND ANAEROBIC Blood Culture results may not be optimal due to an inadequate volume of blood received in culture bottles   Culture   Final    NO GROWTH 5 DAYS Performed at [facility], [address] [place], [place] [address]    Report Status 03/08/2024 FINAL  Final  Culture, blood (Routine X 2) w Reflex to ID Panel     Status: None (Preliminary result)   Collection Time: 03/10/24  1:31 PM   Specimen: BLOOD  Result Value Ref Range Status   Specimen Description BLOOD BLOOD RIGHT HAND  Final   Special Requests   Final    BOTTLES DRAWN AEROBIC ONLY Blood Culture results may not be optimal due to an inadequate volume of blood received in culture bottles   Culture   Final    NO GROWTH 3 DAYS Performed at [facility], [address], [place], [place] [address]    Report Status PENDING  Incomplete  Culture, blood (Routine X 2) w Reflex to ID Panel     Status: None (Preliminary result)   Collection Time: 03/10/24  3:07 PM   Specimen: BLOOD  Result Value Ref Range Status   Specimen Description BLOOD BLOOD RIGHT HAND  Final   Special Requests   Final    BOTTLES DRAWN AEROBIC AND ANAEROBIC Blood Culture adequate volume   Culture   Final    NO GROWTH 3  DAYS Performed at [facility], [address], [place], [place] [address]    Report Status PENDING  Incomplete    Procedures and diagnostic studies:  No results found.              LOS: 10 days   Reynald Woods  [facility]   Pager on [email or web address]. If 7PM-7AM, please contact night-coverage at [email or web address]     03/13/2024, 3:59 PM

## 2024-03-13 NOTE — Progress Notes (Signed)
 Physical Therapy Treatment Patient Details Name: Denise Sharp MRN: [id number] DOB: 05/01/1946 Today's Date: 03/13/2024   History of Present Illness Pt is a 78 year old with hypertension, hyperlipidemia, CAD, anxiety, asthma, CKD, who presents from urology for chief concern of worsening wounds of the left foot.  Dx: Cellulitis of left foot, SIRS (systemic inflammatory response syndrome), Persistent atrial fibrillation. 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    PT Comments  Pt received upright in bed. Reports frequent diarrhea with functional mobility but willing to participate. ModA+1 for supine to sit. Able to sequence LE's and Ue's but ultimately needs heavy support to sit. Focus of session on boosting and lateral scooting to [facility] due to difficulty with standing with 2 person support maxA. Educated on heads/hips relationship. Needs modA+1 for x5 lateral scoots to HOB to the R. X5 Boosting performed working on tricep strength and hip extension for glute clearance. Pt requires modA for each attempt. Pt reports BM with exertional activity. Returns to supine and L rolling for dependent pericare from PT. Pt repositioned in bed with all needs in reach. D/c recs remain appropriate.    If plan is discharge home, recommend the following: Assistance with cooking/housework;Assist for transportation;Help with stairs or ramp for entrance;Two people to help with walking and/or transfers;Two people to help with bathing/dressing/bathroom;Supervision due to cognitive status   Can travel by private vehicle     No  Equipment Recommendations  Hospital bed;Hoyer lift    Recommendations for Other Services       Precautions / Restrictions Precautions Precautions: Fall Recall of Precautions/Restrictions: Impaired Required Braces or Orthoses: Knee Immobilizer - Left Other Brace: LLE immobilizer Restrictions LLE Weight Bearing Per Provider Order: Non weight bearing Other Position/Activity  Restrictions: has immobilizer over amputation site     Mobility  Bed Mobility Overal bed mobility: Needs Assistance Bed Mobility: Supine to Sit, Sit to Supine Rolling: Supervision, Used rails   Supine to sit: Mod assist, HOB elevated, Used rails Sit to supine: Max assist     Patient Response: Cooperative  Transfers Overall transfer level: Needs assistance Equipment used: None Transfers: Bed to chair/wheelchair/BSC            Lateral/Scoot Transfers: Mod assist General transfer comment: x5 scoots to the R towards HOB. VC's for head hips relationship. Difficulty completing.    Ambulation/Gait               General Gait Details: unable attempt ambulation safely   Stairs             Wheelchair Mobility     Tilt Bed Tilt Bed Patient Response: Cooperative  Modified Rankin (Stroke Patients Only)       Balance Overall balance assessment: Needs assistance Sitting-balance support: Feet supported, Single extremity supported Sitting balance-Leahy Scale: Good                                      Communication Communication Communication: No apparent difficulties  Cognition Arousal: Alert Behavior During Therapy: WFL for tasks assessed/performed                             Following commands: Impaired      Cueing Cueing Techniques: Verbal cues, Visual cues, Tactile cues  Exercises Other Exercises Other Exercises: x5 boosting EOB using RLE and BUE support. ModA needed for glut clearance  General Comments        Pertinent Vitals/Pain Pain Assessment Pain Assessment: Faces Faces Pain Scale: Hurts a little bit Pain Location: L knee and phantom pain to her left foot Pain Descriptors / Indicators: Grimacing, Moaning Pain Intervention(s): Limited activity within patient's tolerance, Monitored during session, Repositioned    Home Living                          Prior Function            PT Goals  (current goals can now be found in the care plan section) Acute Rehab PT Goals Patient Stated Goal: none stated PT Goal Formulation: With patient Time For Goal Achievement: 03/22/24 Potential to Achieve Goals: Good Progress towards PT goals: Progressing toward goals    Frequency    Min 2X/week      PT Plan      Co-evaluation              AM-PAC PT 6 Clicks Mobility   Outcome Measure  Help needed turning from your back to your side while in a flat bed without using bedrails?: A Lot Help needed moving from lying on your back to sitting on the side of a flat bed without using bedrails?: A Lot Help needed moving to and from a bed to a chair (including a wheelchair)?: Total Help needed standing up from a chair using your arms (e.g., wheelchair or bedside chair)?: Total Help needed to walk in hospital room?: Total Help needed climbing 3-5 steps with a railing? : Total 6 Click Score: 8    End of Session Equipment Utilized During Treatment: Gait belt Activity Tolerance: Patient tolerated treatment well Patient left: in bed;with call bell/phone within reach;with family/visitor present;with nursing/sitter in room Nurse Communication: Mobility status PT Visit Diagnosis: Muscle weakness (generalized) (M62.81);Difficulty in walking, not elsewhere classified (R26.2) Pain - Right/Left: Left Pain - part of body: Leg     Time: 8655-8570 PT Time Calculation (min) (ACUTE ONLY): 45 min  Charges:    $Therapeutic Activity: 38-52 mins PT General Charges $$ ACUTE PT VISIT: 1 Visit                     Dorina HERO. Fairly IV, PT, DPT Physical Therapist- [place]  [facility] 03/13/2024, 3:54 PM

## 2024-03-13 NOTE — Plan of Care (Signed)

## 2024-03-14 DIAGNOSIS — I96 Gangrene, not elsewhere classified: Secondary | ICD-10-CM | POA: Diagnosis not present

## 2024-03-14 LAB — GLUCOSE, CAPILLARY
Glucose-Capillary: 127 mg/dL — ABNORMAL HIGH (ref 70–99)
Glucose-Capillary: 167 mg/dL — ABNORMAL HIGH (ref 70–99)
Glucose-Capillary: 130 mg/dL — ABNORMAL HIGH (ref 70–99)
Glucose-Capillary: 183 mg/dL — ABNORMAL HIGH (ref 70–99)

## 2024-03-14 NOTE — TOC Progression Note (Addendum)
 Transition of Care [facility]) - Progression Note    Patient Details  Name: Denise Sharp MRN: [id number] Date of Birth: 03-01-1946  Transition of Care [facility]) CM/SW Contact  Marinda Cooks, RN Phone Number: 03/14/2024, 2:03 PM  Clinical Narrative:    This CM called and spoke with Weekend Supervisor Alisa to confirm bed offer at [facility] and was updated pt can not  be received over the weekend due to staffing and the Admission coordinator being on vacation. TOC will cont to follow dc planning / Care coordination and update as applicable.    15:45p This CM spoke with pt and her daughter and was updated that pt does not want to dc to [facility]. Pt requested that this CM speak to Admission liaison at [facility] to inquire if pt's referral can be reconsidered. This CM sent message to Admission Liaison Tammy awaiting response back.    17:25p- This CM received message from Liaison Tammy regarding pt's referral and informed that she will review pt's referral again and follow up with this CM. TOC will cont to follow dc planning / care coordination and update as applicable.    Expected Discharge Plan:  (TBD) Barriers to Discharge: Continued Medical Work up               Expected Discharge Plan and Services       Living arrangements for the past 2 months: Skilled Nursing Facility                                       Social Drivers of Health (SDOH) Interventions SDOH Screenings   Food Insecurity: No Food Insecurity (03/04/2024)  Housing: Low Risk  (03/04/2024)  Transportation Needs: No Transportation Needs (03/04/2024)  Utilities: Not At Risk (03/04/2024)  Depression (PHQ2-9): High Risk (01/09/2024)  Financial Resource Strain: Low Risk  (09/15/2021)  Physical Activity: Insufficiently Active (11/15/2020)  Social Connections: Unknown (03/04/2024)  Stress: Stress Concern Present (08/03/2020)  Tobacco Use: Low Risk  (03/09/2024)    Readmission Risk Interventions     No data to display

## 2024-03-14 NOTE — Progress Notes (Signed)
 [facility]  ROUNDING NOTE   Subjective:   Denise Sharp is a 78 y.o. female with past medical conditions including CAD, hyperlipidemia, hypertension, and chronic kidney disease IIIb. She presents to the ED for evaluation for wound infection and has been admitted for Cellulitis of left foot [L03.116]  Patient is known to our practice and is followed by Dr Dominica. Patient was last seen in office on 01/07/24 for routine follow    Patient seen alert and oriented No family present Partially completed breakfast tray at bedside Room air   Objective:  Vital signs in last 24 hours:  Temp:  [97.5 F (36.4 C)-98.3 F (36.8 C)] 97.5 F (36.4 C) (08/30 0835) Pulse Rate:  [67-76] 76 (08/30 0835) Resp:  [16-18] 18 (08/30 0835) BP: (110-124)/(43-64) 118/64 (08/30 0835) SpO2:  [97 %-100 %] 100 % (08/30 0835) FiO2 (%):  [21 %] 21 % (08/29 2256)  Weight change:  Filed Weights   03/03/24 1826 03/08/24 0825  Weight: 110.3 kg 112.9 kg    Intake/Output: I/O last 3 completed shifts: In: 540 [P.O.:240; IV Piggyback:300] Out: 1400 [Urine:1400]   Intake/Output this shift:  No intake/output data recorded.  Physical Exam: General: NAD  Head: Normocephalic, atraumatic. Moist oral mucosal membranes  Eyes: Anicteric  Lungs:  Clear to auscultation, room air  Heart: Regular rate and rhythm  Abdomen:  Soft, nontender  Extremities:  No peripheral edema.Lt BKA  Neurologic: Awake, alert to self, conversant  Skin: Warm,dry, no rash       Basic Metabolic Panel: Recent Labs  Lab 03/09/24 0422 03/10/24 0440 03/11/24 0437 03/12/24 0549 03/13/24 0636  NA 135 136 139 140 141  K 3.7 3.9 4.0 4.0 3.4*  CL 102 103 103 106 106  CO2 22 22 23 25 25   GLUCOSE 93 115* 137* 162* 128*  BUN 33* 30* 25* 25* 21  CREATININE 1.88* 1.54* 1.57* 1.57* 1.63*  CALCIUM  8.5* 8.7* 8.8* 8.3* 8.2*    Liver Function Tests: No results for input(s): AST, ALT, ALKPHOS, BILITOT, PROT,  ALBUMIN  in the last 168 hours.  No results for input(s): LIPASE, AMYLASE in the last 168 hours. No results for input(s): AMMONIA in the last 168 hours.  CBC: Recent Labs  Lab 03/09/24 0422 03/10/24 0440 03/11/24 0437 03/12/24 0549 03/13/24 0636  WBC 15.5* 15.1* 15.3* 13.9* 13.2*  HGB 8.4* 9.0* 8.7* 8.9* 8.6*  HCT 25.9* 28.5* 27.6* 28.8* 27.7*  MCV 89.9 90.8 90.2 90.3 91.1  PLT 348 408* 432* 445* 456*    Cardiac Enzymes: No results for input(s): CKTOTAL, CKMB, CKMBINDEX, TROPONINI in the last 168 hours.  BNP: Invalid input(s): POCBNP  CBG: Recent Labs  Lab 03/13/24 0752 03/13/24 1159 03/13/24 1659 03/13/24 2010 03/14/24 0817  GLUCAP 121* 235* 135* 116* 127*    Microbiology: Results for orders placed or performed during the hospital encounter of 03/03/24  Blood culture (routine x 2)     Status: Abnormal   Collection Time: 03/03/24  5:01 PM   Specimen: BLOOD RIGHT FOREARM  Result Value Ref Range Status   Specimen Description   Final    BLOOD RIGHT FOREARM Performed at [facility], [address], [place], [place] [address]    Special Requests   Final    BOTTLES DRAWN AEROBIC AND ANAEROBIC Blood Culture results may not be optimal due to an inadequate volume of blood received in culture bottles Performed at [facility], [address] [place], [place] [address]    Culture  Setup Time   Final    GRAM POSITIVE COCCI ANAEROBIC BOTTLE ONLY CRITICAL RESULT CALLED TO, READ BACK BY AND VERIFIED WITH: TIFFANY GILCHRIST, PHARMD AT 1010 ON 03/04/24 GM    Culture (A)  Final    STAPHYLOCOCCUS HOMINIS THE SIGNIFICANCE OF ISOLATING THIS ORGANISM FROM A SINGLE SET OF BLOOD CULTURES WHEN MULTIPLE SETS ARE DRAWN IS UNCERTAIN. PLEASE NOTIFY THE MICROBIOLOGY DEPARTMENT WITHIN ONE WEEK IF SPECIATION AND SENSITIVITIES ARE REQUIRED. Performed at [facility], [address], [place], [place] [address]    Report Status 03/06/2024 FINAL  Final   Blood Culture ID Panel (Reflexed)     Status: Abnormal   Collection Time: 03/03/24  5:01 PM  Result Value Ref Range Status   Enterococcus faecalis NOT DETECTED NOT DETECTED Final   Enterococcus Faecium NOT DETECTED NOT DETECTED Final   Listeria monocytogenes NOT DETECTED NOT DETECTED Final   Staphylococcus species DETECTED (A) NOT DETECTED Final    Comment: CRITICAL RESULT CALLED TO, READ BACK BY AND VERIFIED WITH: TIFFANY GILCHRIST, PHARMD AT 1010 ON 03/04/24 GM    Staphylococcus aureus (BCID) NOT DETECTED NOT DETECTED Final   Staphylococcus epidermidis NOT DETECTED NOT DETECTED Final   Staphylococcus lugdunensis NOT DETECTED NOT DETECTED Final   Streptococcus species NOT DETECTED NOT DETECTED Final   Streptococcus agalactiae NOT DETECTED NOT DETECTED Final   Streptococcus pneumoniae NOT DETECTED NOT DETECTED Final   Streptococcus pyogenes NOT DETECTED NOT DETECTED Final   A.calcoaceticus-baumannii NOT DETECTED NOT DETECTED Final   Bacteroides fragilis NOT DETECTED NOT DETECTED Final   Enterobacterales NOT DETECTED NOT DETECTED Final   Enterobacter cloacae complex NOT DETECTED NOT DETECTED Final   Escherichia coli NOT DETECTED NOT DETECTED Final   Klebsiella aerogenes NOT DETECTED NOT DETECTED Final   Klebsiella oxytoca NOT DETECTED NOT DETECTED Final   Klebsiella pneumoniae NOT DETECTED NOT DETECTED Final   Proteus species NOT DETECTED NOT DETECTED Final   Salmonella species NOT DETECTED NOT DETECTED Final   Serratia marcescens NOT DETECTED NOT DETECTED Final   Haemophilus influenzae NOT DETECTED NOT DETECTED Final   Neisseria meningitidis NOT DETECTED NOT DETECTED Final   Pseudomonas aeruginosa NOT DETECTED NOT DETECTED Final   Stenotrophomonas maltophilia NOT DETECTED NOT DETECTED Final   Candida albicans NOT DETECTED NOT DETECTED Final   Candida auris NOT DETECTED NOT DETECTED Final   Candida glabrata NOT DETECTED NOT DETECTED Final   Candida krusei NOT DETECTED NOT DETECTED  Final   Candida parapsilosis NOT DETECTED NOT DETECTED Final   Candida tropicalis NOT DETECTED NOT DETECTED Final   Cryptococcus neoformans/gattii NOT DETECTED NOT DETECTED Final    Comment: Performed at [facility], [address] [place], [place] [address]  Blood culture (routine x 2)     Status: None   Collection Time: 03/03/24  7:01 PM   Specimen: BLOOD  Result Value Ref Range Status   Specimen Description BLOOD BLOOD RIGHT HAND  Final   Special Requests   Final    BOTTLES DRAWN AEROBIC AND ANAEROBIC Blood Culture results may not be optimal due to an inadequate volume of blood received in culture bottles   Culture   Final    NO GROWTH 5 DAYS Performed at [facility], [address] [place], [place] [address]    Report Status 03/08/2024 FINAL  Final  Culture, blood (Routine X 2) w Reflex to ID Panel     Status: None (Preliminary result)   Collection Time: 03/10/24  1:31 PM  Specimen: BLOOD  Result Value Ref Range Status   Specimen Description BLOOD BLOOD RIGHT HAND  Final   Special Requests   Final    BOTTLES DRAWN AEROBIC ONLY Blood Culture results may not be optimal due to an inadequate volume of blood received in culture bottles   Culture   Final    NO GROWTH 4 DAYS Performed at [facility], [address] [place], [place] [address]    Report Status PENDING  Incomplete  Culture, blood (Routine X 2) w Reflex to ID Panel     Status: None (Preliminary result)   Collection Time: 03/10/24  3:07 PM   Specimen: BLOOD  Result Value Ref Range Status   Specimen Description BLOOD BLOOD RIGHT HAND  Final   Special Requests   Final    BOTTLES DRAWN AEROBIC AND ANAEROBIC Blood Culture adequate volume   Culture   Final    NO GROWTH 4 DAYS Performed at [facility], [address], [place], [place] [address]    Report Status PENDING  Incomplete   *Note: Due to a large number of results and/or encounters for the requested time period, some  results have not been displayed. A complete set of results can be found in Results Review.    Coagulation Studies: No results for input(s): LABPROT, INR in the last 72 hours.  Urinalysis: No results for input(s): COLORURINE, LABSPEC, PHURINE, GLUCOSEU, HGBUR, BILIRUBINUR, KETONESUR, PROTEINUR, UROBILINOGEN, NITRITE, LEUKOCYTESUR in the last 72 hours.  Invalid input(s): APPERANCEUR     Imaging: No results found.    Medications:      allopurinol   100 mg Oral Daily   apixaban   5 mg Oral BID   atorvastatin   80 mg Oral QHS   colchicine   0.6 mg Oral Daily   dapagliflozin  propanediol  10 mg Oral Daily   ezetimibe   10 mg Oral Daily   feeding supplement  237 mL Oral BID BM   Finerenone   10 mg Oral q AM   fluticasone  furoate-vilanterol  1 puff Inhalation Daily   insulin  aspart  0-15 Units Subcutaneous TID WC   insulin  aspart  0-5 Units Subcutaneous QHS   pantoprazole   40 mg Oral Daily   pregabalin   25 mg Oral QHS   acetaminophen , fluticasone , lidocaine , loperamide , nitroGLYCERIN , nystatin , oxyCODONE , polyethylene glycol  Assessment/ Plan:  Denise Sharp is a 78 y.o.  female with past medical conditions including CAD, hyperlipidemia, hypertension, and chronic kidney disease IIIb. She presents to the ED for evaluation for wound infection and has been admitted for Cellulitis of left foot [L03.116]   Acute Kidney Injury on chronic kidney disease stage IIIb with baseline creatinine 1.2 and GFR of 45.  Acute kidney injury secondary to infection and hypotension.  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   Renal function remains acceptable.  IVF stopped. Patient encouraged to maintain adequate oral intake. No acute need for dialysis. Will continue to monitor.   Lab Results  Component Value Date   CREATININE 1.63 (H) 03/13/2024   CREATININE 1.57 (H)  03/12/2024   CREATININE 1.57 (H) 03/11/2024    Intake/Output Summary (Last 24 hours) at 03/14/2024 1005 Last data filed at 03/13/2024 1545 Gross per 24 hour  Intake 240 ml  Output 500 ml  Net -260 ml    2. Anemia of chronic kidney disease Lab Results  Component Value Date   HGB 8.6 (L) 03/13/2024    Will  monitor for need of ESA.   3. Secondary Hyperparathyroidism: with outpatient labs: PTH 149, phosphorus 4.5, calcium  8.9 on 09/30/23.   Lab Results  Component Value Date   CALCIUM  8.2 (L) 03/13/2024   CAION 1.17 06/04/2022    Calcium  has slowly decreased during this admission. Will continue to monitor  4. Cellulitis, left foot wound with possible osteomyelitis. 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 Completed antibiotic therapy   LOS: 11 Gayna Braddy 8/30/202510:05 AM

## 2024-03-14 NOTE — Progress Notes (Addendum)
 Progress Note    Denise Sharp  FMW:[id number] DOB: [birth year]  DOA: 03/03/2024 PCP: Denise Verneita CROME, MD      Brief Narrative:    Medical records reviewed and are as summarized below:  Denise Sharp is a 78 y.o. female with hypertension, hyperlipidemia, CAD, anxiety, asthma, CKD, who presents from urology for chief concern of worsening wounds of the left foot, reports low grade fever at home, chills and increasing pain with discharge of her left foot. Progressive charcoaled arthropathy vs superimposed OM as per XR. MRI with worsening Charot joint and possible OM, Podiatry recommend Left BKA which patient underwent on 08/23 by Vascular Surgery      Assessment/Plan:   Principal Problem:   Gangrene of left foot (HCC) Active Problems:   SIRS (systemic inflammatory response syndrome) (HCC)   Type II diabetes mellitus with neurological manifestations (HCC)   OSA on CPAP   Iron  deficiency anemia   Obesity hypoventilation syndrome (HCC)   Coronary artery disease of native artery of native heart with stable angina pectoris (HCC)   Persistent atrial fibrillation (HCC)   Restless legs syndrome   Morbid obesity (HCC)   Hypersomnia, persistent   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 Met SIRS criteria on adm (fever, leukocytosis, lactic acidosis) - Bcx 1/4: GPC (Staph species), likely contaminant  - XR Left foot  advanced osseous destructive changes and fragmentation of the midfoot, consistent with Charcot arthropathy.  Progressive charco arthropathy vs superimposed OM - MRI Left foot shows severe Charcot changes, findings concerning for early OM vs reactive changes throughout talus. No evidence of abscess - Seen by Podiatry, who recommended left BKA - Seen by Vascular surgery, s/p Left BKA 08/23, pathology sent Analgesics as needed for pain.   LUE cellulitis/gout involving the left hand (painful swelling, erythema of dorsal aspect of left hand)- improving Completed 10 days of antibiotics on 03/14/2023 - Continue colchicine  and allopurinol .    Acute toxic/metabolic encephalopathy on 08/24 Mental status has improved. - Suspect multifactorial due to medication (opioids, hydroxyzine , zanaflex ), hospital acquired deirium, AKI on CKD - No acute abnormality on CT head on 03/09/2023 -Zanaflex , Hydroxyzine , Opioids, Cymbalta , IV morphine  have been discontinued. Continue Lyrica  Urinalysis 120/2025 negative for UTI.  (patient has recent UTI prior to adm, also leukocytosis)   Left knee pain s/p Total left knee arthroplasty - resolved - XR No acute fracture or dislocation - Pain management    AKI on CKD stage 3a baseline appears 1.1-1.3,  Creatinine down from 1.88-1.57-1.57-1.63 Monitor BMP.  Follow-up with nephrologist.   Hypokalemia Potassium repleted.  Monitor BMP   Hypotension-improved Hx HTN S/p IV albumin  and IV fluids   Persistent atrial fibrillation (HCC) - Continue Eliquis     NICM with preserved ejection fraction Torsemide  on hold    Type II diabetes mellitus with neurological manifestations (HCC) Lantus  has been on hold Continue sliding scale insulin   History of gout Continue colchicine  and allopurinol      Obesity hypoventilation syndrome (HCC) Cpap at bedtime ordered    Weakness Fall precaution Plan to discharge to SNF   Constipation Improved.  She had  multiple watery stools. She has history of chronic diarrhea and fecal incontinence and requested that scheduled laxatives be discontinued and also requested Imodium  to be used as needed..    Comorbidities include generalized anxiety disorder, major depressive, hyperlipidemia, iron  deficiency anemia (periodic iron  infusion in the outpatient setting)   She is medically stable for discharge to SNF.  Awaiting placement.   Diet Order             Diet heart healthy/carb modified Room service appropriate? Yes; Fluid consistency: Thin  Diet effective now                                  Consultants: Podiatrist Vascular surgeon  Procedures: Left below-knee amputation on 03/08/2023    Medications:    allopurinol   100 mg Oral Daily   apixaban   5 mg Oral BID   atorvastatin   80 mg Oral QHS   colchicine   0.6 mg Oral Daily   dapagliflozin  propanediol  10 mg Oral Daily   ezetimibe   10 mg Oral Daily   feeding supplement  237 mL Oral BID BM   Finerenone   10 mg Oral q AM   fluticasone  furoate-vilanterol  1 puff Inhalation Daily   insulin  aspart  0-15 Units Subcutaneous TID WC   insulin  aspart  0-5 Units Subcutaneous QHS   pantoprazole   40 mg Oral Daily   pregabalin   25 mg Oral QHS   Continuous Infusions:     Anti-infectives (From admission, onward)    Start     Dose/Rate Route Frequency Ordered Stop   03/10/24 1430  piperacillin -tazobactam (ZOSYN ) IVPB 3.375 g  Status:  Discontinued        3.375 g 12.5 mL/hr over 240 Minutes Intravenous Every 8 hours 03/10/24 1337 03/13/24 1601   03/08/24 1800  cephALEXin  (KEFLEX ) capsule 500 mg  Status:  Discontinued        500 mg Oral Every 6 hours 03/08/24 1419 03/10/24 1337   03/06/24 0500  ceFEPIme  (MAXIPIME ) 2 g in sodium chloride  0.9 % 100 mL IVPB  Status:  Discontinued        2 g 200 mL/hr over 30 Minutes Intravenous Every 12 hours 03/05/24 2309 03/08/24 1419   03/05/24 1700  vancomycin  (VANCOCIN ) IVPB 1000 mg/200 mL  premix  Status:  Discontinued        1,000 mg 200 mL/hr over 60 Minutes Intravenous Every 24 hours 03/05/24 0813 03/08/24 0828   03/05/24 1200  ceFEPIme  (MAXIPIME ) 2 g in sodium chloride  0.9 % 100 mL IVPB  Status:  Discontinued        2 g 200 mL/hr over 30 Minutes Intravenous 2 times daily 03/05/24 1128 03/05/24 2309   03/04/24 1700  vancomycin  (VANCOREADY) IVPB 1000 mg/200 mL  Status:  Discontinued        1,000 mg 200 mL/hr over 60 Minutes Intravenous Every 24 hours 03/03/24 1738 03/05/24 0813   03/03/24 1730  vancomycin  (VANCOREADY) IVPB 2000 mg/400 mL        2,000 mg 200 mL/hr over 120 Minutes Intravenous  Once 03/03/24 1729 03/04/24 0534   03/03/24 1645  vancomycin  (VANCOCIN ) IVPB 1000 mg/200 mL premix  Status:  Discontinued  1,000 mg 200 mL/hr over 60 Minutes Intravenous  Once 03/03/24 1635 03/03/24 1729              Family Communication/Anticipated D/C date and plan/Code Status   DVT prophylaxis: Place TED hose Start: 03/03/24 1712 apixaban  (ELIQUIS ) tablet 5 mg     Code Status: Full Code  Family Communication: Plan discussed with husband at the bedside.  Disposition Plan: Plan to discharge to SNF   Status is: Inpatient Remains inpatient appropriate because: Altered mental status, s/p left ABA       Subjective:   Interval events noted.  She has some pain at her left BKA stump site. She has some concerns about transitioning from rehab to home because she does not think she can manage at home and her husband will not be able to take care of her at home.  Objective:    Vitals:   03/13/24 1543 03/13/24 1946 03/14/24 0552 03/14/24 0835  BP: (!) 110/43 (!) 120/45 (!) 124/51 118/64  Pulse: 67 69 69 76  Resp: 16 17  18   Temp: 98.2 F (36.8 C) 98.1 F (36.7 C) 98.3 F (36.8 C) (!) 97.5 F (36.4 C)  TempSrc:      SpO2: 98% 97% 100% 100%  Weight:      Height:       No data found.   Intake/Output Summary (Last 24 hours) at 03/14/2024 1439 Last  data filed at 03/14/2024 1300 Gross per 24 hour  Intake 480 ml  Output 500 ml  Net -20 ml   Filed Weights   03/03/24 1826 03/08/24 0825  Weight: 110.3 kg 112.9 kg    Exam:  GEN: NAD SKIN: Warm and dry EYES: EOMI ENT: MMM CV: RRR PULM: CTA B ABD: soft, obese, NT, +BS CNS: AAO x 3, non focal EXT: Left BKA.  Swelling, tenderness and erythema of left hand have improved        Data Reviewed:   I have personally reviewed following labs and imaging studies:  Labs: Labs show the following:   Basic Metabolic Panel: Recent Labs  Lab 03/09/24 0422 03/10/24 0440 03/11/24 0437 03/12/24 0549 03/13/24 0636  NA 135 136 139 140 141  K 3.7 3.9 4.0 4.0 3.4*  CL 102 103 103 106 106  CO2 22 22 23 25 25   GLUCOSE 93 115* 137* 162* 128*  BUN 33* 30* 25* 25* 21  CREATININE 1.88* 1.54* 1.57* 1.57* 1.63*  CALCIUM  8.5* 8.7* 8.8* 8.3* 8.2*   GFR Estimated Creatinine Clearance: 35 mL/min (A) (by C-G formula based on SCr of 1.63 mg/dL (H)). Liver Function Tests: No results for input(s): AST, ALT, ALKPHOS, BILITOT, PROT, ALBUMIN  in the last 168 hours. No results for input(s): LIPASE, AMYLASE in the last 168 hours. No results for input(s): AMMONIA in the last 168 hours. Coagulation profile No results for input(s): INR, PROTIME in the last 168 hours.  CBC: Recent Labs  Lab 03/09/24 0422 03/10/24 0440 03/11/24 0437 03/12/24 0549 03/13/24 0636  WBC 15.5* 15.1* 15.3* 13.9* 13.2*  HGB 8.4* 9.0* 8.7* 8.9* 8.6*  HCT 25.9* 28.5* 27.6* 28.8* 27.7*  MCV 89.9 90.8 90.2 90.3 91.1  PLT 348 408* 432* 445* 456*   Cardiac Enzymes: No results for input(s): CKTOTAL, CKMB, CKMBINDEX, TROPONINI in the last 168 hours. BNP (last 3 results) No results for input(s): PROBNP in the last 8760 hours. CBG: Recent Labs  Lab 03/13/24 1159 03/13/24 1659 03/13/24 2010 03/14/24 0817 03/14/24 1212  GLUCAP 235* 135* 116* 127*  167*   D-Dimer: No results for  input(s): DDIMER in the last 72 hours. Hgb A1c: No results for input(s): HGBA1C in the last 72 hours. Lipid Profile: No results for input(s): CHOL, HDL, LDLCALC, TRIG, CHOLHDL, LDLDIRECT in the last 72 hours. Thyroid  function studies: No results for input(s): TSH, T4TOTAL, T3FREE, THYROIDAB in the last 72 hours.  Invalid input(s): FREET3 Anemia work up: Recent Labs    03/12/24 0549  FERRITIN 2,782*  TIBC 113*  IRON  33   Sepsis Labs: Recent Labs  Lab 03/10/24 0440 03/11/24 0437 03/12/24 0549 03/13/24 0636  WBC 15.1* 15.3* 13.9* 13.2*    Microbiology Recent Results (from the past 240 hours)  Culture, blood (Routine X 2) w Reflex to ID Panel     Status: None (Preliminary result)   Collection Time: 03/10/24  1:31 PM   Specimen: BLOOD  Result Value Ref Range Status   Specimen Description BLOOD BLOOD RIGHT HAND  Final   Special Requests   Final    BOTTLES DRAWN AEROBIC ONLY Blood Culture results may not be optimal due to an inadequate volume of blood received in culture bottles   Culture   Final    NO GROWTH 4 DAYS Performed at [facility], [address], [place], [place] [address]    Report Status PENDING  Incomplete  Culture, blood (Routine X 2) w Reflex to ID Panel     Status: None (Preliminary result)   Collection Time: 03/10/24  3:07 PM   Specimen: BLOOD  Result Value Ref Range Status   Specimen Description BLOOD BLOOD RIGHT HAND  Final   Special Requests   Final    BOTTLES DRAWN AEROBIC AND ANAEROBIC Blood Culture adequate volume   Culture   Final    NO GROWTH 4 DAYS Performed at [facility] [place], [address], [place], [place] [address]    Report Status PENDING  Incomplete    Procedures and diagnostic studies:  No results found.              LOS: 11 days   Denise Sharp  [facility]   Pager on [email or web address]. If 7PM-7AM, please contact night-coverage at [email or web address]     03/14/2024,  2:39 PM

## 2024-03-15 DIAGNOSIS — I96 Gangrene, not elsewhere classified: Secondary | ICD-10-CM | POA: Diagnosis not present

## 2024-03-15 LAB — CULTURE, BLOOD (ROUTINE X 2)
Culture: NO GROWTH
Culture: NO GROWTH
Special Requests: ADEQUATE

## 2024-03-15 LAB — CBC WITH DIFFERENTIAL/PLATELET
Abs Immature Granulocytes: 0.79 K/uL — ABNORMAL HIGH (ref 0.00–0.07)
Basophils Absolute: 0.1 K/uL (ref 0.0–0.1)
Basophils Relative: 1 %
Eosinophils Absolute: 0.3 K/uL (ref 0.0–0.5)
Eosinophils Relative: 3 %
HCT: 29.6 % — ABNORMAL LOW (ref 36.0–46.0)
Hemoglobin: 9.4 g/dL — ABNORMAL LOW (ref 12.0–15.0)
Immature Granulocytes: 7 %
Lymphocytes Relative: 31 %
Lymphs Abs: 3.5 K/uL (ref 0.7–4.0)
MCH: 28.7 pg (ref 26.0–34.0)
MCHC: 31.8 g/dL (ref 30.0–36.0)
MCV: 90.5 fL (ref 80.0–100.0)
Monocytes Absolute: 0.6 K/uL (ref 0.1–1.0)
Monocytes Relative: 5 %
Neutro Abs: 6.1 K/uL (ref 1.7–7.7)
Neutrophils Relative %: 53 %
Platelets: 455 K/uL — ABNORMAL HIGH (ref 150–400)
RBC: 3.27 MIL/uL — ABNORMAL LOW (ref 3.87–5.11)
RDW: 15.8 % — ABNORMAL HIGH (ref 11.5–15.5)
Smear Review: NORMAL
WBC: 11.5 K/uL — ABNORMAL HIGH (ref 4.0–10.5)
nRBC: 0 % (ref 0.0–0.2)

## 2024-03-15 LAB — GLUCOSE, CAPILLARY
Glucose-Capillary: 160 mg/dL — ABNORMAL HIGH (ref 70–99)
Glucose-Capillary: 140 mg/dL — ABNORMAL HIGH (ref 70–99)
Glucose-Capillary: 135 mg/dL — ABNORMAL HIGH (ref 70–99)

## 2024-03-15 LAB — BASIC METABOLIC PANEL WITH GFR
Anion gap: 10 (ref 5–15)
BUN: 14 mg/dL (ref 8–23)
CO2: 25 mmol/L (ref 22–32)
Calcium: 8.7 mg/dL — ABNORMAL LOW (ref 8.9–10.3)
Chloride: 108 mmol/L (ref 98–111)
Creatinine, Ser: 1.36 mg/dL — ABNORMAL HIGH (ref 0.44–1.00)
GFR, Estimated: 40 mL/min — ABNORMAL LOW (ref >60)
Glucose, Bld: 162 mg/dL — ABNORMAL HIGH (ref 70–99)
Potassium: 3.9 mmol/L (ref 3.5–5.1)
Sodium: 143 mmol/L (ref 135–145)

## 2024-03-15 MED ORDER — COLCHICINE 0.6 MG PO TABS: 0.6000 mg | ORAL_TABLET | Freq: Every day | ORAL | Status: DC

## 2024-03-15 MED ORDER — ACETAMINOPHEN 325 MG PO TABS: 650.0000 mg | ORAL_TABLET | Freq: Four times a day (QID) | ORAL | Status: AC | PRN

## 2024-03-15 NOTE — Plan of Care (Signed)
 Patient has required prn pain medication during current shift.  Patient remains free from any noted signs of associated symptoms.  No additional medical interventions required at this time.

## 2024-03-15 NOTE — Progress Notes (Signed)
 [facility]  ROUNDING NOTE   Subjective:   Denise Sharp is a 78 y.o. female with past medical conditions including CAD, hyperlipidemia, hypertension, and chronic kidney disease IIIb. She presents to the ED for evaluation for wound infection and has been admitted for Cellulitis of left foot [L03.116]  Patient is known to our practice and is followed by Dr Dominica. Patient was last seen in office on 01/07/24 for routine follow    Patient seen sitting up in bed Primary team at bedside States she used ice pack on left hand and stiffness has improved  Creatinine 1.36   Objective:  Vital signs in last 24 hours:  Temp:  [97.4 F (36.3 C)-98.7 F (37.1 C)] 97.7 F (36.5 C) (08/31 0745) Pulse Rate:  [70-73] 71 (08/31 0745) Resp:  [16-21] 16 (08/31 0745) BP: (117-123)/(47-58) 117/47 (08/31 0745) SpO2:  [93 %-97 %] 95 % (08/31 0745) FiO2 (%):  [21 %] 21 % (08/30 2300)  Weight change:  Filed Weights   03/03/24 1826 03/08/24 0825  Weight: 110.3 kg 112.9 kg    Intake/Output: I/O last 3 completed shifts: In: 720 [P.O.:720] Out: 700 [Urine:700]   Intake/Output this shift:  No intake/output data recorded.  Physical Exam: General: NAD  Head: Normocephalic, atraumatic. Moist oral mucosal membranes  Eyes: Anicteric  Lungs:  Clear to auscultation, room air  Heart: Regular rate and rhythm  Abdomen:  Soft, nontender  Extremities:  No peripheral edema.Lt BKA  Neurologic: Awake, alert to self, conversant  Skin: Warm,dry, no rash       Basic Metabolic Panel: Recent Labs  Lab 03/10/24 0440 03/11/24 0437 03/12/24 0549 03/13/24 0636 03/15/24 0812  NA 136 139 140 141 143  K 3.9 4.0 4.0 3.4* 3.9  CL 103 103 106 106 108  CO2 22 23 25 25 25   GLUCOSE 115* 137* 162* 128* 162*  BUN 30* 25* 25* 21 14  CREATININE 1.54* 1.57* 1.57* 1.63* 1.36*  CALCIUM  8.7* 8.8* 8.3* 8.2* 8.7*    Liver Function Tests: No results for input(s): AST, ALT, ALKPHOS, BILITOT,  PROT, ALBUMIN  in the last 168 hours.  No results for input(s): LIPASE, AMYLASE in the last 168 hours. No results for input(s): AMMONIA in the last 168 hours.  CBC: Recent Labs  Lab 03/10/24 0440 03/11/24 0437 03/12/24 0549 03/13/24 0636 03/15/24 0812  WBC 15.1* 15.3* 13.9* 13.2* 11.5*  NEUTROABS  --   --   --   --  6.1  HGB 9.0* 8.7* 8.9* 8.6* 9.4*  HCT 28.5* 27.6* 28.8* 27.7* 29.6*  MCV 90.8 90.2 90.3 91.1 90.5  PLT 408* 432* 445* 456* 455*    Cardiac Enzymes: No results for input(s): CKTOTAL, CKMB, CKMBINDEX, TROPONINI in the last 168 hours.  BNP: Invalid input(s): POCBNP  CBG: Recent Labs  Lab 03/14/24 0817 03/14/24 1212 03/14/24 1700 03/14/24 2021 03/15/24 0742  GLUCAP 127* 167* 130* 183* 160*    Microbiology: Results for orders placed or performed during the hospital encounter of 03/03/24  Blood culture (routine x 2)     Status: Abnormal   Collection Time: 03/03/24  5:01 PM   Specimen: BLOOD RIGHT FOREARM  Result Value Ref Range Status   Specimen Description   Final    BLOOD RIGHT FOREARM Performed at [facility], [address], [place], [place] [address]    Special Requests   Final    BOTTLES DRAWN AEROBIC AND ANAEROBIC Blood Culture results may not be optimal due to an inadequate volume  of blood received in culture bottles Performed at [facility], [address] [place], [place] [address]    Culture  Setup Time   Final    GRAM POSITIVE COCCI ANAEROBIC BOTTLE ONLY CRITICAL RESULT CALLED TO, READ BACK BY AND VERIFIED WITH: TIFFANY GILCHRIST, PHARMD AT 1010 ON 03/04/24 GM    Culture (A)  Final    STAPHYLOCOCCUS HOMINIS THE SIGNIFICANCE OF ISOLATING THIS ORGANISM FROM A SINGLE SET OF BLOOD CULTURES WHEN MULTIPLE SETS ARE DRAWN IS UNCERTAIN. PLEASE NOTIFY THE MICROBIOLOGY DEPARTMENT WITHIN ONE WEEK IF SPECIATION AND SENSITIVITIES ARE REQUIRED. Performed at [facility], [address] [place], [place]  [address]    Report Status 03/06/2024 FINAL  Final  Blood Culture ID Panel (Reflexed)     Status: Abnormal   Collection Time: 03/03/24  5:01 PM  Result Value Ref Range Status   Enterococcus faecalis NOT DETECTED NOT DETECTED Final   Enterococcus Faecium NOT DETECTED NOT DETECTED Final   Listeria monocytogenes NOT DETECTED NOT DETECTED Final   Staphylococcus species DETECTED (A) NOT DETECTED Final    Comment: CRITICAL RESULT CALLED TO, READ BACK BY AND VERIFIED WITH: TIFFANY GILCHRIST, PHARMD AT 1010 ON 03/04/24 GM    Staphylococcus aureus (BCID) NOT DETECTED NOT DETECTED Final   Staphylococcus epidermidis NOT DETECTED NOT DETECTED Final   Staphylococcus lugdunensis NOT DETECTED NOT DETECTED Final   Streptococcus species NOT DETECTED NOT DETECTED Final   Streptococcus agalactiae NOT DETECTED NOT DETECTED Final   Streptococcus pneumoniae NOT DETECTED NOT DETECTED Final   Streptococcus pyogenes NOT DETECTED NOT DETECTED Final   A.calcoaceticus-baumannii NOT DETECTED NOT DETECTED Final   Bacteroides fragilis NOT DETECTED NOT DETECTED Final   Enterobacterales NOT DETECTED NOT DETECTED Final   Enterobacter cloacae complex NOT DETECTED NOT DETECTED Final   Escherichia coli NOT DETECTED NOT DETECTED Final   Klebsiella aerogenes NOT DETECTED NOT DETECTED Final   Klebsiella oxytoca NOT DETECTED NOT DETECTED Final   Klebsiella pneumoniae NOT DETECTED NOT DETECTED Final   Proteus species NOT DETECTED NOT DETECTED Final   Salmonella species NOT DETECTED NOT DETECTED Final   Serratia marcescens NOT DETECTED NOT DETECTED Final   Haemophilus influenzae NOT DETECTED NOT DETECTED Final   Neisseria meningitidis NOT DETECTED NOT DETECTED Final   Pseudomonas aeruginosa NOT DETECTED NOT DETECTED Final   Stenotrophomonas maltophilia NOT DETECTED NOT DETECTED Final   Candida albicans NOT DETECTED NOT DETECTED Final   Candida auris NOT DETECTED NOT DETECTED Final   Candida glabrata NOT DETECTED NOT DETECTED  Final   Candida krusei NOT DETECTED NOT DETECTED Final   Candida parapsilosis NOT DETECTED NOT DETECTED Final   Candida tropicalis NOT DETECTED NOT DETECTED Final   Cryptococcus neoformans/gattii NOT DETECTED NOT DETECTED Final    Comment: Performed at [facility], [address] [place], [place] [address]  Blood culture (routine x 2)     Status: None   Collection Time: 03/03/24  7:01 PM   Specimen: BLOOD  Result Value Ref Range Status   Specimen Description BLOOD BLOOD RIGHT HAND  Final   Special Requests   Final    BOTTLES DRAWN AEROBIC AND ANAEROBIC Blood Culture results may not be optimal due to an inadequate volume of blood received in culture bottles   Culture   Final    NO GROWTH 5 DAYS Performed at [facility], [address], [place], [place] [address]    Report Status 03/08/2024 FINAL  Final  Culture, blood (Routine X 2)  w Reflex to ID Panel     Status: None   Collection Time: 03/10/24  1:31 PM   Specimen: BLOOD  Result Value Ref Range Status   Specimen Description BLOOD BLOOD RIGHT HAND  Final   Special Requests   Final    BOTTLES DRAWN AEROBIC ONLY Blood Culture results may not be optimal due to an inadequate volume of blood received in culture bottles   Culture   Final    NO GROWTH 5 DAYS Performed at [facility], [address], [place], [place] [address]    Report Status 03/15/2024 FINAL  Final  Culture, blood (Routine X 2) w Reflex to ID Panel     Status: None   Collection Time: 03/10/24  3:07 PM   Specimen: BLOOD  Result Value Ref Range Status   Specimen Description BLOOD BLOOD RIGHT HAND  Final   Special Requests   Final    BOTTLES DRAWN AEROBIC AND ANAEROBIC Blood Culture adequate volume   Culture   Final    NO GROWTH 5 DAYS Performed at [facility], [address] [place], [place] [address]    Report Status 03/15/2024 FINAL  Final   *Note: Due to a large number of results and/or encounters for the requested  time period, some results have not been displayed. A complete set of results can be found in Results Review.    Coagulation Studies: No results for input(s): LABPROT, INR in the last 72 hours.  Urinalysis: No results for input(s): COLORURINE, LABSPEC, PHURINE, GLUCOSEU, HGBUR, BILIRUBINUR, KETONESUR, PROTEINUR, UROBILINOGEN, NITRITE, LEUKOCYTESUR in the last 72 hours.  Invalid input(s): APPERANCEUR     Imaging: No results found.    Medications:      allopurinol   100 mg Oral Daily   apixaban   5 mg Oral BID   atorvastatin   80 mg Oral QHS   colchicine   0.6 mg Oral Daily   dapagliflozin  propanediol  10 mg Oral Daily   ezetimibe   10 mg Oral Daily   feeding supplement  237 mL Oral BID BM   Finerenone   10 mg Oral q AM   fluticasone  furoate-vilanterol  1 puff Inhalation Daily   insulin  aspart  0-15 Units Subcutaneous TID WC   insulin  aspart  0-5 Units Subcutaneous QHS   pantoprazole   40 mg Oral Daily   pregabalin   25 mg Oral QHS   acetaminophen , fluticasone , lidocaine , loperamide , nitroGLYCERIN , nystatin , oxyCODONE , polyethylene glycol  Assessment/ Plan:  [name] is a 78 y.o.  female with past medical conditions including CAD, hyperlipidemia, hypertension, and chronic kidney disease IIIb. She presents to the ED for evaluation for wound infection and has been admitted for Cellulitis of left foot [L03.116]   Acute Kidney Injury on chronic kidney disease stage IIIb with baseline creatinine 1.2 and GFR of 45.  Acute kidney injury secondary to infection and hypotension.  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   Renal function has returned to baseline. Will need follow up in our office at discharge.   Lab Results  Component Value Date   CREATININE 1.36 (H) 03/15/2024   CREATININE 1.63 (H) 03/13/2024   CREATININE 1.57 (H) 03/12/2024     Intake/Output Summary (Last 24 hours) at 03/15/2024 0943 Last data filed at 03/15/2024 0434 Gross per 24 hour  Intake 480 ml  Output 700 ml  Net -220 ml    2. Anemia of chronic kidney disease Lab Results  Component Value Date   HGB 9.4 (L) 03/15/2024    Hgb remains stable. Will continue to monitor for need of ESA  3. Secondary Hyperparathyroidism: with outpatient labs: PTH 149, phosphorus 4.5, calcium  8.9 on 09/30/23.   Lab Results  Component Value Date   CALCIUM  8.7 (L) 03/15/2024   CAION 1.17 06/04/2022    Calcium  continues to improve.   4. Cellulitis, left foot wound with possible osteomyelitis. 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 Completed antibiotic therapy. Continue supportive measures   LOS: 12 Orva Gwaltney 8/31/20259:43 AM

## 2024-03-15 NOTE — Progress Notes (Addendum)
 Progress Note    Denise Sharp  FMW:[id number] DOB: [birth year]  DOA: 03/03/2024 PCP: Marylynn Verneita CROME, MD      Brief Narrative:    Medical records reviewed and are as summarized below:  Denise Sharp is a 78 y.o. female with hypertension, hyperlipidemia, CAD, anxiety, asthma, CKD, who presents from urology for chief concern of worsening wounds of the left foot, reports low grade fever at home, chills and increasing pain with discharge of her left foot. Progressive charcoaled arthropathy vs superimposed OM as per XR. MRI with worsening Charot joint and possible OM, Podiatry recommend Left BKA which patient underwent on 08/23 by Vascular Surgery      Assessment/Plan:   Principal Problem:   Gangrene of left foot (HCC) Active Problems:   SIRS (systemic inflammatory response syndrome) (HCC)   Type II diabetes mellitus with neurological manifestations (HCC)   OSA on CPAP   Iron  deficiency anemia   Obesity hypoventilation syndrome (HCC)   Coronary artery disease of native artery of native heart with stable angina pectoris (HCC)   Persistent atrial fibrillation (HCC)   Restless legs syndrome   Morbid obesity (HCC)   Hypersomnia, persistent   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 Met SIRS criteria on adm (fever, leukocytosis, lactic acidosis) - Bcx 1/4: GPC (Staph species), likely contaminant  - XR Left foot  advanced osseous destructive changes and fragmentation of the midfoot, consistent with Charcot arthropathy.  Progressive charco arthropathy vs superimposed OM - MRI Left foot shows severe Charcot changes, findings concerning for early OM vs reactive changes throughout talus. No evidence of abscess - Seen by Podiatry, who recommended left BKA - Seen by Vascular surgery, s/p Left BKA 08/23 Leg, amputation, left BKA :  Pathology report showed joint changes consistent with neuropathic arthropathy, gouty tophi, degenerative soft tissue changes, mild atherosclerosis.   LUE cellulitis/gout involving the left hand (painful swelling, erythema of dorsal aspect of left hand)-improved Completed 10 days of antibiotics on 03/14/2023 - Continue colchicine  and allopurinol . Cold compresses and left wrist brace have been ordered per patient's request    Acute toxic/metabolic encephalopathy on 08/24 Mental status has improved. - Suspect multifactorial due to medication (opioids, hydroxyzine , zanaflex ), hospital acquired deirium, AKI on CKD - No acute abnormality on CT head on 03/09/2023 -Zanaflex , Hydroxyzine , Opioids, Cymbalta , IV morphine  have been discontinued. Continue Lyrica  Urinalysis 120/2025 negative for UTI.  (patient has recent UTI prior to adm, also leukocytosis)   Left knee pain s/p Total left knee arthroplasty - resolved - XR No acute fracture or dislocation - Pain management    AKI on CKD stage 3a Improved. Creatinine down from 1.88-1.57-1.57-1.63-1.36 Creatinine close to baseline.   Hypokalemia Improved   Hypotension-improved Hx HTN S/p IV albumin  and IV fluids   Persistent atrial fibrillation (HCC) - Continue Eliquis     NICM with preserved ejection fraction Torsemide  on hold  Type II diabetes mellitus with neurological manifestations (HCC) Lantus  has been on hold Continue sliding scale insulin      History of gout Continue colchicine  and allopurinol      Obesity  hypoventilation syndrome (HCC) Cpap at bedtime ordered    Weakness Fall precaution Plan to discharge to SNF   Constipation Improved.     Chronic fecal incontinence, diarrhea Imodium  as needed    Comorbidities include generalized anxiety disorder, major depressive, hyperlipidemia, iron  deficiency anemia (periodic iron  infusion in the outpatient setting)   She is medically stable for discharge to SNF.  Awaiting placement.   Diet Order             Diet - low sodium heart healthy           Diet Carb Modified           Diet heart healthy/carb modified Room service appropriate? Yes; Fluid consistency: Thin  Diet effective now                                  Consultants: Podiatrist Vascular surgeon Nephrologist  Procedures: Left below-knee amputation on 03/08/2023    Medications:    allopurinol   100 mg Oral Daily   apixaban   5 mg Oral BID   atorvastatin   80 mg Oral QHS   colchicine   0.6 mg Oral Daily   dapagliflozin  propanediol  10 mg Oral Daily   ezetimibe   10 mg Oral Daily   feeding supplement  237 mL Oral BID BM   Finerenone   10 mg Oral q AM   fluticasone  furoate-vilanterol  1 puff Inhalation Daily   insulin  aspart  0-15 Units Subcutaneous TID WC   insulin  aspart  0-5 Units Subcutaneous QHS   pantoprazole   40 mg Oral Daily   pregabalin   25 mg Oral QHS   Continuous Infusions:     Anti-infectives (From admission, onward)    Start     Dose/Rate Route Frequency Ordered Stop   03/10/24 1430  piperacillin -tazobactam (ZOSYN ) IVPB 3.375 g  Status:  Discontinued        3.375 g 12.5 mL/hr over 240 Minutes Intravenous Every 8 hours 03/10/24 1337 03/13/24 1601   03/08/24 1800  cephALEXin  (KEFLEX ) capsule 500 mg  Status:  Discontinued        500 mg Oral Every 6 hours 03/08/24 1419 03/10/24 1337   03/06/24 0500  ceFEPIme  (MAXIPIME ) 2 g in sodium chloride  0.9 % 100 mL IVPB  Status:  Discontinued        2 g 200 mL/hr over 30 Minutes  Intravenous Every 12 hours 03/05/24 2309 03/08/24 1419   03/05/24 1700  vancomycin  (VANCOCIN ) IVPB 1000 mg/200 mL premix  Status:  Discontinued        1,000 mg 200 mL/hr over 60 Minutes Intravenous Every 24 hours 03/05/24 0813 03/08/24 0828   03/05/24 1200  ceFEPIme  (MAXIPIME ) 2 g in sodium chloride  0.9 % 100 mL IVPB  Status:  Discontinued        2 g 200 mL/hr over 30 Minutes Intravenous 2 times daily 03/05/24 1128 03/05/24 2309   03/04/24 1700  vancomycin  (VANCOREADY) IVPB 1000 mg/200 mL  Status:  Discontinued        1,000 mg 200 mL/hr over 60 Minutes Intravenous Every 24 hours 03/03/24 1738 03/05/24 0813   03/03/24 1730  vancomycin  (VANCOREADY) IVPB 2000 mg/400 mL        2,000 mg 200 mL/hr over  120 Minutes Intravenous  Once 03/03/24 1729 03/04/24 0534   03/03/24 1645  vancomycin  (VANCOCIN ) IVPB 1000 mg/200 mL premix  Status:  Discontinued        1,000 mg 200 mL/hr over 60 Minutes Intravenous  Once 03/03/24 1635 03/03/24 1729              Family Communication/Anticipated D/C date and plan/Code Status   DVT prophylaxis: Place TED hose Start: 03/03/24 1712 apixaban  (ELIQUIS ) tablet 5 mg     Code Status: Full Code  Family Communication: None Disposition Plan: Plan to discharge to SNF   Status is: Inpatient Remains inpatient appropriate because: Awaiting placement to SNF      Subjective:   Interval events noted.  She complains of phantom pain.  She requested cold compresses and a wrist brace for his left hand.  Dr. Marcelino (nephrologist) and his nurse practitioner, Faith, came to  the bedside  Objective:    Vitals:   03/14/24 1658 03/14/24 2018 03/15/24 0434 03/15/24 0745  BP: (!) 123/58 (!) 120/49 (!) 120/54 (!) 117/47  Pulse: 70 73 72 71  Resp: 18 (!) 21 18 16   Temp: 98.7 F (37.1 C) 98.2 F (36.8 C) (!) 97.4 F (36.3 C) 97.7 F (36.5 C)  TempSrc: Oral     SpO2: 96% 97% 93% 95%  Weight:      Height:       No data found.   Intake/Output Summary  (Last 24 hours) at 03/15/2024 1244 Last data filed at 03/15/2024 0900 Gross per 24 hour  Intake 960 ml  Output 700 ml  Net 260 ml   Filed Weights   03/03/24 1826 03/08/24 0825  Weight: 110.3 kg 112.9 kg    Exam:  GEN: NAD SKIN: Warm and dry EYES: No pallor or icterus ENT: MMM CV: RRR PULM: CTA B ABD: soft, obese, NT, +BS CNS: AAO x 3, non focal EXT: Left BKA.  Left hand swelling, tenderness and erythema with improved      Data Reviewed:   I have personally reviewed following labs and imaging studies:  Labs: Labs show the following:   Basic Metabolic Panel: Recent Labs  Lab 03/10/24 0440 03/11/24 0437 03/12/24 0549 03/13/24 0636 03/15/24 0812  NA 136 139 140 141 143  K 3.9 4.0 4.0 3.4* 3.9  CL 103 103 106 106 108  CO2 22 23 25 25 25   GLUCOSE 115* 137* 162* 128* 162*  BUN 30* 25* 25* 21 14  CREATININE 1.54* 1.57* 1.57* 1.63* 1.36*  CALCIUM  8.7* 8.8* 8.3* 8.2* 8.7*   GFR Estimated Creatinine Clearance: 42 mL/min (A) (by C-G formula based on SCr of 1.36 mg/dL (H)). Liver Function Tests: No results for input(s): AST, ALT, ALKPHOS, BILITOT, PROT, ALBUMIN  in the last 168 hours. No results for input(s): LIPASE, AMYLASE in the last 168 hours. No results for input(s): AMMONIA in the last 168 hours. Coagulation profile No results for input(s): INR, PROTIME in the last 168 hours.  CBC: Recent Labs  Lab 03/10/24 0440 03/11/24 0437 03/12/24 0549 03/13/24 0636 03/15/24 0812  WBC 15.1* 15.3* 13.9* 13.2* 11.5*  NEUTROABS  --   --   --   --  6.1  HGB 9.0* 8.7* 8.9* 8.6* 9.4*  HCT 28.5* 27.6* 28.8* 27.7* 29.6*  MCV 90.8 90.2 90.3 91.1 90.5  PLT 408* 432* 445* 456* 455*   Cardiac Enzymes: No results for input(s): CKTOTAL, CKMB, CKMBINDEX, TROPONINI in the last 168 hours. BNP (last 3 results) No results for input(s):  PROBNP in the last 8760 hours. CBG: Recent Labs  Lab 03/14/24 1212 03/14/24 1700 03/14/24 2021 03/15/24 0742  03/15/24 1211  GLUCAP 167* 130* 183* 160* 140*   D-Dimer: No results for input(s): DDIMER in the last 72 hours. Hgb A1c: No results for input(s): HGBA1C in the last 72 hours. Lipid Profile: No results for input(s): CHOL, HDL, LDLCALC, TRIG, CHOLHDL, LDLDIRECT in the last 72 hours. Thyroid  function studies: No results for input(s): TSH, T4TOTAL, T3FREE, THYROIDAB in the last 72 hours.  Invalid input(s): FREET3 Anemia work up: No results for input(s): VITAMINB12, FOLATE, FERRITIN, TIBC, IRON , RETICCTPCT in the last 72 hours.  Sepsis Labs: Recent Labs  Lab 03/11/24 0437 03/12/24 0549 03/13/24 0636 03/15/24 0812  WBC 15.3* 13.9* 13.2* 11.5*    Microbiology Recent Results (from the past 240 hours)  Culture, blood (Routine X 2) w Reflex to ID Panel     Status: None   Collection Time: 03/10/24  1:31 PM   Specimen: BLOOD  Result Value Ref Range Status   Specimen Description BLOOD BLOOD RIGHT HAND  Final   Special Requests   Final    BOTTLES DRAWN AEROBIC ONLY Blood Culture results may not be optimal due to an inadequate volume of blood received in culture bottles   Culture   Final    NO GROWTH 5 DAYS Performed at [facility], [address] [place], [place] [address]    Report Status 03/15/2024 FINAL  Final  Culture, blood (Routine X 2) w Reflex to ID Panel     Status: None   Collection Time: 03/10/24  3:07 PM   Specimen: BLOOD  Result Value Ref Range Status   Specimen Description BLOOD BLOOD RIGHT HAND  Final   Special Requests   Final    BOTTLES DRAWN AEROBIC AND ANAEROBIC Blood Culture adequate volume   Culture   Final    NO GROWTH 5 DAYS Performed at [facility], [address], [place], [place] [address]    Report Status 03/15/2024 FINAL  Final    Procedures and diagnostic studies:  No results found.              LOS: 12 days   Shaylynn Nulty  [facility]   Pager on  [email or web address]. If 7PM-7AM, please contact night-coverage at [email or web address]     03/15/2024, 12:44 PM

## 2024-03-16 DIAGNOSIS — I959 Hypotension, unspecified: Secondary | ICD-10-CM | POA: Diagnosis not present

## 2024-03-16 DIAGNOSIS — E1129 Type 2 diabetes mellitus with other diabetic kidney complication: Secondary | ICD-10-CM | POA: Diagnosis not present

## 2024-03-16 DIAGNOSIS — R197 Diarrhea, unspecified: Secondary | ICD-10-CM | POA: Diagnosis not present

## 2024-03-16 DIAGNOSIS — N3946 Mixed incontinence: Secondary | ICD-10-CM | POA: Diagnosis not present

## 2024-03-16 DIAGNOSIS — N390 Urinary tract infection, site not specified: Secondary | ICD-10-CM | POA: Diagnosis not present

## 2024-03-16 DIAGNOSIS — M86172 Other acute osteomyelitis, left ankle and foot: Secondary | ICD-10-CM | POA: Diagnosis not present

## 2024-03-16 DIAGNOSIS — E1122 Type 2 diabetes mellitus with diabetic chronic kidney disease: Secondary | ICD-10-CM | POA: Diagnosis not present

## 2024-03-16 DIAGNOSIS — D513 Other dietary vitamin B12 deficiency anemia: Secondary | ICD-10-CM | POA: Diagnosis not present

## 2024-03-16 DIAGNOSIS — I10 Essential (primary) hypertension: Secondary | ICD-10-CM | POA: Diagnosis not present

## 2024-03-16 DIAGNOSIS — L0390 Cellulitis, unspecified: Secondary | ICD-10-CM | POA: Diagnosis not present

## 2024-03-16 DIAGNOSIS — M1009 Idiopathic gout, multiple sites: Secondary | ICD-10-CM | POA: Diagnosis not present

## 2024-03-16 DIAGNOSIS — R070 Pain in throat: Secondary | ICD-10-CM | POA: Diagnosis not present

## 2024-03-16 DIAGNOSIS — N3080 Other cystitis without hematuria: Secondary | ICD-10-CM | POA: Diagnosis not present

## 2024-03-16 DIAGNOSIS — M14672 Charcot's joint, left ankle and foot: Secondary | ICD-10-CM | POA: Diagnosis not present

## 2024-03-16 DIAGNOSIS — E785 Hyperlipidemia, unspecified: Secondary | ICD-10-CM | POA: Diagnosis not present

## 2024-03-16 DIAGNOSIS — R410 Disorientation, unspecified: Secondary | ICD-10-CM | POA: Diagnosis not present

## 2024-03-16 DIAGNOSIS — J309 Allergic rhinitis, unspecified: Secondary | ICD-10-CM | POA: Diagnosis not present

## 2024-03-16 DIAGNOSIS — I69921 Dysphasia following unspecified cerebrovascular disease: Secondary | ICD-10-CM | POA: Diagnosis not present

## 2024-03-16 DIAGNOSIS — I509 Heart failure, unspecified: Secondary | ICD-10-CM | POA: Diagnosis not present

## 2024-03-16 DIAGNOSIS — Z4781 Encounter for orthopedic aftercare following surgical amputation: Secondary | ICD-10-CM | POA: Diagnosis not present

## 2024-03-16 DIAGNOSIS — E46 Unspecified protein-calorie malnutrition: Secondary | ICD-10-CM | POA: Diagnosis not present

## 2024-03-16 DIAGNOSIS — R5381 Other malaise: Secondary | ICD-10-CM | POA: Diagnosis not present

## 2024-03-16 DIAGNOSIS — R443 Hallucinations, unspecified: Secondary | ICD-10-CM | POA: Diagnosis not present

## 2024-03-16 DIAGNOSIS — F32A Depression, unspecified: Secondary | ICD-10-CM | POA: Diagnosis not present

## 2024-03-16 DIAGNOSIS — N1832 Chronic kidney disease, stage 3b: Secondary | ICD-10-CM | POA: Diagnosis not present

## 2024-03-16 DIAGNOSIS — R3914 Feeling of incomplete bladder emptying: Secondary | ICD-10-CM | POA: Diagnosis not present

## 2024-03-16 DIAGNOSIS — M6281 Muscle weakness (generalized): Secondary | ICD-10-CM | POA: Diagnosis not present

## 2024-03-16 DIAGNOSIS — E569 Vitamin deficiency, unspecified: Secondary | ICD-10-CM | POA: Diagnosis not present

## 2024-03-16 DIAGNOSIS — E876 Hypokalemia: Secondary | ICD-10-CM | POA: Diagnosis not present

## 2024-03-16 DIAGNOSIS — Z741 Need for assistance with personal care: Secondary | ICD-10-CM | POA: Diagnosis not present

## 2024-03-16 DIAGNOSIS — Z6841 Body Mass Index (BMI) 40.0 and over, adult: Secondary | ICD-10-CM | POA: Diagnosis not present

## 2024-03-16 DIAGNOSIS — K121 Other forms of stomatitis: Secondary | ICD-10-CM | POA: Diagnosis not present

## 2024-03-16 DIAGNOSIS — R2681 Unsteadiness on feet: Secondary | ICD-10-CM | POA: Diagnosis not present

## 2024-03-16 DIAGNOSIS — N179 Acute kidney failure, unspecified: Secondary | ICD-10-CM | POA: Diagnosis not present

## 2024-03-16 DIAGNOSIS — I96 Gangrene, not elsewhere classified: Secondary | ICD-10-CM | POA: Diagnosis not present

## 2024-03-16 DIAGNOSIS — D649 Anemia, unspecified: Secondary | ICD-10-CM | POA: Diagnosis not present

## 2024-03-16 DIAGNOSIS — G629 Polyneuropathy, unspecified: Secondary | ICD-10-CM | POA: Diagnosis not present

## 2024-03-16 DIAGNOSIS — E559 Vitamin D deficiency, unspecified: Secondary | ICD-10-CM | POA: Diagnosis not present

## 2024-03-16 DIAGNOSIS — N3281 Overactive bladder: Secondary | ICD-10-CM | POA: Diagnosis not present

## 2024-03-16 DIAGNOSIS — Z7401 Bed confinement status: Secondary | ICD-10-CM | POA: Diagnosis not present

## 2024-03-16 DIAGNOSIS — H04123 Dry eye syndrome of bilateral lacrimal glands: Secondary | ICD-10-CM | POA: Diagnosis not present

## 2024-03-16 DIAGNOSIS — D631 Anemia in chronic kidney disease: Secondary | ICD-10-CM | POA: Diagnosis not present

## 2024-03-16 DIAGNOSIS — E119 Type 2 diabetes mellitus without complications: Secondary | ICD-10-CM | POA: Diagnosis not present

## 2024-03-16 DIAGNOSIS — F419 Anxiety disorder, unspecified: Secondary | ICD-10-CM | POA: Diagnosis not present

## 2024-03-16 DIAGNOSIS — L03116 Cellulitis of left lower limb: Secondary | ICD-10-CM | POA: Diagnosis not present

## 2024-03-16 DIAGNOSIS — E162 Hypoglycemia, unspecified: Secondary | ICD-10-CM | POA: Diagnosis not present

## 2024-03-16 DIAGNOSIS — Z9884 Bariatric surgery status: Secondary | ICD-10-CM | POA: Diagnosis not present

## 2024-03-16 DIAGNOSIS — R809 Proteinuria, unspecified: Secondary | ICD-10-CM | POA: Diagnosis not present

## 2024-03-16 DIAGNOSIS — N2581 Secondary hyperparathyroidism of renal origin: Secondary | ICD-10-CM | POA: Diagnosis not present

## 2024-03-16 DIAGNOSIS — A5216 Charcot's arthropathy (tabetic): Secondary | ICD-10-CM | POA: Diagnosis not present

## 2024-03-16 DIAGNOSIS — M6259 Muscle wasting and atrophy, not elsewhere classified, multiple sites: Secondary | ICD-10-CM | POA: Diagnosis not present

## 2024-03-16 DIAGNOSIS — R300 Dysuria: Secondary | ICD-10-CM | POA: Diagnosis not present

## 2024-03-16 DIAGNOSIS — E568 Deficiency of other vitamins: Secondary | ICD-10-CM | POA: Diagnosis not present

## 2024-03-16 DIAGNOSIS — G4733 Obstructive sleep apnea (adult) (pediatric): Secondary | ICD-10-CM | POA: Diagnosis not present

## 2024-03-16 DIAGNOSIS — R609 Edema, unspecified: Secondary | ICD-10-CM | POA: Diagnosis not present

## 2024-03-16 LAB — GLUCOSE, CAPILLARY
Glucose-Capillary: 222 mg/dL — ABNORMAL HIGH (ref 70–99)
Glucose-Capillary: 175 mg/dL — ABNORMAL HIGH (ref 70–99)

## 2024-03-16 NOTE — Progress Notes (Incomplete)
 {  Select_TRH_Note:[id number]}

## 2024-03-16 NOTE — Progress Notes (Signed)
 [facility]  ROUNDING NOTE   Subjective:   Denise Sharp is a 78 y.o. female with past medical conditions including CAD, hyperlipidemia, hypertension, and chronic kidney disease IIIb. She presents to the ED for evaluation for wound infection and has been admitted for Cellulitis of left foot [L03.116]  Patient is known to our practice and is followed by Dr Dominica. Patient was last seen in office on 01/07/24 for routine follow    Update: Patient seen sitting up in bed Eating breakfast Room air No complaints    Objective:  Vital signs in last 24 hours:  Temp:  [98.2 F (36.8 C)-99.4 F (37.4 C)] 98.2 F (36.8 C) (09/01 0358) Pulse Rate:  [69-77] 77 (09/01 0700) Resp:  [16-18] 18 (09/01 0700) BP: (126-139)/(51-59) 134/59 (09/01 0700) SpO2:  [97 %-99 %] 98 % (09/01 0700) FiO2 (%):  [21 %] 21 % (08/31 2215)  Weight change:  Filed Weights   03/03/24 1826 03/08/24 0825  Weight: 110.3 kg 112.9 kg    Intake/Output: I/O last 3 completed shifts: In: 1200 [P.O.:1200] Out: 1550 [Urine:1550]   Intake/Output this shift:  Total I/O In: 120 [P.O.:120] Out: -   Physical Exam: General: NAD  Head: Normocephalic, atraumatic. Moist oral mucosal membranes  Eyes: Anicteric  Lungs:  Clear to auscultation, room air  Heart: Regular rate and rhythm  Abdomen:  Soft, nontender  Extremities:  No peripheral edema.Lt BKA  Neurologic: Awake, alert to self, conversant  Skin: Warm,dry, no rash       Basic Metabolic Panel: Recent Labs  Lab 03/10/24 0440 03/11/24 0437 03/12/24 0549 03/13/24 0636 03/15/24 0812  NA 136 139 140 141 143  K 3.9 4.0 4.0 3.4* 3.9  CL 103 103 106 106 108  CO2 22 23 25 25 25   GLUCOSE 115* 137* 162* 128* 162*  BUN 30* 25* 25* 21 14  CREATININE 1.54* 1.57* 1.57* 1.63* 1.36*  CALCIUM  8.7* 8.8* 8.3* 8.2* 8.7*    Liver Function Tests: No results for input(s): AST, ALT, ALKPHOS, BILITOT, PROT, ALBUMIN  in the last 168 hours.  No  results for input(s): LIPASE, AMYLASE in the last 168 hours. No results for input(s): AMMONIA in the last 168 hours.  CBC: Recent Labs  Lab 03/10/24 0440 03/11/24 0437 03/12/24 0549 03/13/24 0636 03/15/24 0812  WBC 15.1* 15.3* 13.9* 13.2* 11.5*  NEUTROABS  --   --   --   --  6.1  HGB 9.0* 8.7* 8.9* 8.6* 9.4*  HCT 28.5* 27.6* 28.8* 27.7* 29.6*  MCV 90.8 90.2 90.3 91.1 90.5  PLT 408* 432* 445* 456* 455*    Cardiac Enzymes: No results for input(s): CKTOTAL, CKMB, CKMBINDEX, TROPONINI in the last 168 hours.  BNP: Invalid input(s): POCBNP  CBG: Recent Labs  Lab 03/14/24 2021 03/15/24 0742 03/15/24 1211 03/15/24 1638 03/16/24 0747  GLUCAP 183* 160* 140* 135* 175*    Microbiology: Results for orders placed or performed during the hospital encounter of 03/03/24  Blood culture (routine x 2)     Status: Abnormal   Collection Time: 03/03/24  5:01 PM   Specimen: BLOOD RIGHT FOREARM  Result Value Ref Range Status   Specimen Description   Final    BLOOD RIGHT FOREARM Performed at [facility], [address], [place], [place] [address]    Special Requests   Final    BOTTLES DRAWN AEROBIC AND ANAEROBIC Blood Culture results may not be optimal due to an inadequate volume of blood received in culture bottles Performed  at [facility], [address], [place], [place] [address]    Culture  Setup Time   Final    GRAM POSITIVE COCCI ANAEROBIC BOTTLE ONLY CRITICAL RESULT CALLED TO, READ BACK BY AND VERIFIED WITH: TIFFANY GILCHRIST, PHARMD AT 1010 ON 03/04/24 GM    Culture (A)  Final    STAPHYLOCOCCUS HOMINIS THE SIGNIFICANCE OF ISOLATING THIS ORGANISM FROM A SINGLE SET OF BLOOD CULTURES WHEN MULTIPLE SETS ARE DRAWN IS UNCERTAIN. PLEASE NOTIFY THE MICROBIOLOGY DEPARTMENT WITHIN ONE WEEK IF SPECIATION AND SENSITIVITIES ARE REQUIRED. Performed at [facility], [address], [place], [place] [address]    Report Status 03/06/2024 FINAL  Final   Blood Culture ID Panel (Reflexed)     Status: Abnormal   Collection Time: 03/03/24  5:01 PM  Result Value Ref Range Status   Enterococcus faecalis NOT DETECTED NOT DETECTED Final   Enterococcus Faecium NOT DETECTED NOT DETECTED Final   Listeria monocytogenes NOT DETECTED NOT DETECTED Final   Staphylococcus species DETECTED (A) NOT DETECTED Final    Comment: CRITICAL RESULT CALLED TO, READ BACK BY AND VERIFIED WITH: TIFFANY GILCHRIST, PHARMD AT 1010 ON 03/04/24 GM    Staphylococcus aureus (BCID) NOT DETECTED NOT DETECTED Final   Staphylococcus epidermidis NOT DETECTED NOT DETECTED Final   Staphylococcus lugdunensis NOT DETECTED NOT DETECTED Final   Streptococcus species NOT DETECTED NOT DETECTED Final   Streptococcus agalactiae NOT DETECTED NOT DETECTED Final   Streptococcus pneumoniae NOT DETECTED NOT DETECTED Final   Streptococcus pyogenes NOT DETECTED NOT DETECTED Final   A.calcoaceticus-baumannii NOT DETECTED NOT DETECTED Final   Bacteroides fragilis NOT DETECTED NOT DETECTED Final   Enterobacterales NOT DETECTED NOT DETECTED Final   Enterobacter cloacae complex NOT DETECTED NOT DETECTED Final   Escherichia coli NOT DETECTED NOT DETECTED Final   Klebsiella aerogenes NOT DETECTED NOT DETECTED Final   Klebsiella oxytoca NOT DETECTED NOT DETECTED Final   Klebsiella pneumoniae NOT DETECTED NOT DETECTED Final   Proteus species NOT DETECTED NOT DETECTED Final   Salmonella species NOT DETECTED NOT DETECTED Final   Serratia marcescens NOT DETECTED NOT DETECTED Final   Haemophilus influenzae NOT DETECTED NOT DETECTED Final   Neisseria meningitidis NOT DETECTED NOT DETECTED Final   Pseudomonas aeruginosa NOT DETECTED NOT DETECTED Final   Stenotrophomonas maltophilia NOT DETECTED NOT DETECTED Final   Candida albicans NOT DETECTED NOT DETECTED Final   Candida auris NOT DETECTED NOT DETECTED Final   Candida glabrata NOT DETECTED NOT DETECTED Final   Candida krusei NOT DETECTED NOT DETECTED  Final   Candida parapsilosis NOT DETECTED NOT DETECTED Final   Candida tropicalis NOT DETECTED NOT DETECTED Final   Cryptococcus neoformans/gattii NOT DETECTED NOT DETECTED Final    Comment: Performed at [facility], [address] [place], [place] [address]  Blood culture (routine x 2)     Status: None   Collection Time: 03/03/24  7:01 PM   Specimen: BLOOD  Result Value Ref Range Status   Specimen Description BLOOD BLOOD RIGHT HAND  Final   Special Requests   Final    BOTTLES DRAWN AEROBIC AND ANAEROBIC Blood Culture results may not be optimal due to an inadequate volume of blood received in culture bottles   Culture   Final    NO GROWTH 5 DAYS Performed at [facility], [address] [place], [place] [address]    Report Status 03/08/2024 FINAL  Final  Culture, blood (Routine X 2) w Reflex to ID Panel  Status: None   Collection Time: 03/10/24  1:31 PM   Specimen: BLOOD  Result Value Ref Range Status   Specimen Description BLOOD BLOOD RIGHT HAND  Final   Special Requests   Final    BOTTLES DRAWN AEROBIC ONLY Blood Culture results may not be optimal due to an inadequate volume of blood received in culture bottles   Culture   Final    NO GROWTH 5 DAYS Performed at [facility], [address] [place], [place] [address]    Report Status 03/15/2024 FINAL  Final  Culture, blood (Routine X 2) w Reflex to ID Panel     Status: None   Collection Time: 03/10/24  3:07 PM   Specimen: BLOOD  Result Value Ref Range Status   Specimen Description BLOOD BLOOD RIGHT HAND  Final   Special Requests   Final    BOTTLES DRAWN AEROBIC AND ANAEROBIC Blood Culture adequate volume   Culture   Final    NO GROWTH 5 DAYS Performed at [facility], [address] [place], [place] [address]    Report Status 03/15/2024 FINAL  Final   *Note: Due to a large number of results and/or encounters for the requested time period, some results have not been displayed.  A complete set of results can be found in Results Review.    Coagulation Studies: No results for input(s): LABPROT, INR in the last 72 hours.  Urinalysis: No results for input(s): COLORURINE, LABSPEC, PHURINE, GLUCOSEU, HGBUR, BILIRUBINUR, KETONESUR, PROTEINUR, UROBILINOGEN, NITRITE, LEUKOCYTESUR in the last 72 hours.  Invalid input(s): APPERANCEUR     Imaging: No results found.    Medications:      allopurinol   100 mg Oral Daily   apixaban   5 mg Oral BID   atorvastatin   80 mg Oral QHS   colchicine   0.6 mg Oral Daily   dapagliflozin  propanediol  10 mg Oral Daily   ezetimibe   10 mg Oral Daily   feeding supplement  237 mL Oral BID BM   Finerenone   10 mg Oral q AM   fluticasone  furoate-vilanterol  1 puff Inhalation Daily   insulin  aspart  0-15 Units Subcutaneous TID WC   insulin  aspart  0-5 Units Subcutaneous QHS   pantoprazole   40 mg Oral Daily   pregabalin   25 mg Oral QHS   acetaminophen , fluticasone , lidocaine , loperamide , nitroGLYCERIN , nystatin , polyethylene glycol  Assessment/ Plan:  Denise Sharp is a 78 y.o.  female with past medical conditions including CAD, hyperlipidemia, hypertension, and chronic kidney disease IIIb. She presents to the ED for evaluation for wound infection and has been admitted for Cellulitis of left foot [L03.116]   Acute Kidney Injury on chronic kidney disease stage IIIb with baseline creatinine 1.2 and GFR of 45.  Acute kidney injury secondary to infection and hypotension.  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   No updated labs today. Renal function at baseline. Will need follow up in our office at discharge.   Lab Results  Component Value Date   CREATININE 1.36 (H) 03/15/2024   CREATININE 1.63 (H) 03/13/2024   CREATININE 1.57 (H) 03/12/2024    Intake/Output Summary (Last 24 hours) at 03/16/2024  1025 Last data filed at 03/16/2024 0900 Gross per 24 hour  Intake 840 ml  Output 850 ml  Net -10 ml    2. Anemia of chronic kidney disease Lab Results  Component Value Date   HGB 9.4 (L)  03/15/2024    Hgb improving. Will continue to monitor for need of ESA  3. Secondary Hyperparathyroidism: with outpatient labs: PTH 149, phosphorus 4.5, calcium  8.9 on 09/30/23.   Lab Results  Component Value Date   CALCIUM  8.7 (L) 03/15/2024   CAION 1.17 06/04/2022    Will continue to monitor bone minerals during this admission.  4. Cellulitis, left foot wound with possible osteomyelitis. 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 Completed antibiotic therapy.    LOS: 13 Kenley Rettinger 9/1/202510:25 AM

## 2024-03-16 NOTE — Plan of Care (Signed)
 The patient is alert and oriented x4. Speech is clear, able to voice needs to staff. Denies any c/o pain or discomfort.

## 2024-03-16 NOTE — TOC Transition Note (Signed)
 Transition of Care [facility]) - Discharge Note   Patient Details  Name: Denise Sharp MRN: [id number] Date of Birth: 14-Jul-1946  Transition of Care [facility] CM/SW Contact:  Marinda Cooks, RN Phone Number: 03/16/2024, 11:36 AM   Clinical Narrative:    This CM updated by covering MD pt medically cleared to dc today and has active DC order . This CM spoke with  Tammy Admission liaison @  [facility] . Pt confirmed she has her home C pap with her and was informed to take with her to facility. DC transportation confirmed for pt with [facility]. Medical team updated . No additional DC needs requested by medical team or identified by CM at this time .     Final next level of care: Skilled Nursing Facility Barriers to Discharge: No Barriers Identified   Patient Goals and CMS Choice Patient states their goals for this hospitalization and ongoing recovery are:: To recieve Rehab prior to going home for strengthing CMS Medicare.gov Compare Post Acute Care list provided to:: Patient Choice offered to / list presented to : Patient      Discharge Placement PASRR number recieved: 03/16/24 Existing PASRR number confirmed : 03/16/24          Patient chooses bed at: [facility] [place] Patient to be transferred to facility by: Ambulance Name of family member notified: Patient and Husband Patient and family notified of of transfer: 03/16/24     Social Drivers of Health (SDOH) Interventions SDOH Screenings   Food Insecurity: No Food Insecurity (03/04/2024)  Housing: Low Risk  (03/04/2024)  Transportation Needs: No Transportation Needs (03/04/2024)  Utilities: Not At Risk (03/04/2024)  Depression (PHQ2-9): High Risk (01/09/2024)  Financial Resource Strain: Low Risk  (09/15/2021)  Physical Activity: Insufficiently Active (11/15/2020)  Social Connections: Unknown (03/04/2024)  Stress: Stress Concern Present (08/03/2020)  Tobacco Use: Low Risk  (03/09/2024)     Readmission Risk Interventions      No data to display

## 2024-03-16 NOTE — Progress Notes (Addendum)
 [facility] was called twice to give report on Pt.  Writer was told that Nurse Leontine would be receiving report, and had not responded to page. 1:25 pm - Jessica McGhee-Smith received report for Pt.

## 2024-03-16 NOTE — Discharge Summary (Addendum)
 Physician Discharge Summary   Patient: Denise Sharp MRN: [id number] DOB: 01/17/1946  Admit date:     03/03/2024  Discharge date: 03/16/24  Discharge Physician: AIDA CHO   PCP: Marylynn Verneita CROME, MD   Recommendations at discharge:   Follow-up with physician at the nursing home within 3 days of discharge Follow-up with vascular surgeon in 1 week for left BKA wound check   Discharge Diagnoses: Principal Problem:   Gangrene of left foot (HCC) Active Problems:   SIRS (systemic inflammatory response syndrome) (HCC)   Type II diabetes mellitus with neurological manifestations (HCC)   OSA on CPAP   Iron  deficiency anemia   Obesity hypoventilation syndrome (HCC)   Coronary artery disease of native artery of native heart with stable angina pectoris (HCC)   Persistent atrial fibrillation (HCC)   Restless legs syndrome   Morbid obesity (HCC)   Hypersomnia, persistent   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 [facility] Course:  Left foot wound, left foot gangrene/cellulitis Severe Charcot arthropathy, possible Osteomyelitis 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 Met SIRS criteria on adm (fever, leukocytosis, lactic acidosis) - Bcx 1/4: GPC (Staph species), likely contaminant  - XR Left foot advanced osseous destructive changes and fragmentation of the midfoot, consistent with Charcot arthropathy.  Progressive charco arthropathy vs superimposed OM - MRI Left foot shows severe Charcot changes, findings concerning for early OM vs reactive changes throughout talus.  No evidence of abscess - Seen by Podiatry, who recommended left BKA - Seen by Vascular surgery, s/p Left BKA 08/23 Leg, amputation, left BKA :  Pathology report showed joint changes consistent with neuropathic arthropathy, gouty tophi, degenerative soft tissue changes, mild atherosclerosis.     LUE cellulitis/gout involving the left hand (painful swelling, erythema of dorsal aspect of left hand)-improved Completed 10 days of antibiotics on 03/14/2023 - Continue colchicine  and allopurinol . Cold compresses as needed to left hand and left wrist brace (if available) to left hand as needed     Acute toxic/metabolic encephalopathy on 08/24 Hospital-acquired delirium Mental status is back to baseline Probably from combination of medications, infection, AKI on CKD - No acute abnormality on CT head on 03/09/2023 Urinalysis 120/2025 negative for UTI.  (patient has recent UTI prior to adm, also leukocytosis)     Left knee pain s/p Total left knee arthroplasty - resolved - XR No acute fracture or dislocation    AKI on CKD stage 3a Improved. Creatinine down from 1.88-1.57-1.57-1.63-1.36 Creatinine at baseline.  Outpatient follow-up with PCP or nephrologist.     Hypokalemia Improved     Hypotension-improved Hx HTN S/p IV albumin  and IV fluids     Persistent atrial fibrillation (HCC) - Continue Eliquis      NICM with preserved ejection fraction Resume torsemide  at discharge     Type II diabetes mellitus with neurological manifestations (HCC) Continue CIGA, Tresiba  and NovoLog  with meals    History of gout Continue colchicine  and allopurinol      Obesity hypoventilation syndrome/OSA Continue CPAP at night     Weakness Fall  precaution Plan to discharge to SNF     Constipation Improved.       Chronic fecal incontinence, diarrhea Imodium  as needed       Comorbidities include generalized anxiety disorder, major depressive, hyperlipidemia, iron  deficiency anemia  (periodic iron  infusion in the outpatient setting)      Her condition has improved and she is deemed stable for discharge to SNF today.      Pain control - [place]  Controlled Substance Reporting System database was reviewed. and patient was instructed, not to drive, operate heavy machinery, perform activities at heights, swimming or participation in water  activities or provide baby-sitting services while on Pain, Sleep and Anxiety Medications; until their outpatient Physician has advised to do so again. Also recommended to not to take more than prescribed Pain, Sleep and Anxiety Medications.  Consultants: Podiatrist Vascular surgeon Nephrologist Procedures performed: Left below-knee amputation on 03/08/2023   Disposition: Skilled nursing facility Diet recommendation:  Discharge Diet Orders (From admission, onward)     Start     Ordered   03/15/24 0000  Diet - low sodium heart healthy        03/15/24 1008   03/15/24 0000  Diet Carb Modified        03/15/24 1008           Cardiac and Carb modified diet DISCHARGE MEDICATION: Allergies as of 03/16/2024       Reactions   Amoxicillin  Diarrhea   amoxicillin    Clarithromycin  Nausea And Vomiting, Other (See Comments)   Severe irritable bowel clarithromycin    Demeclocycline Hives   Erythromycin Nausea And Vomiting, Other (See Comments)   Severe irritable bowel   Flagyl [metronidazole] Nausea And Vomiting, Other (See Comments)   Severe irritable bowel   Glucophage [metformin Hcl] Nausea And Vomiting, Other (See Comments)   Sick I won't take anything that has metformin in it   Tetracyclines & Related Hives, Rash   Potassium Chloride  Other (See Comments)   Cannot swallow potssium pills, able to tolerate liquid potassium   Tetracycline Other (See Comments)   Diovan [valsartan] Nausea Only       Sulfa  Antibiotics Other (See Comments), Rash   As child   Xanax [alprazolam] Other (See Comments)   Hyperactivity          Medication List     STOP taking these medications    cefdinir  300 MG capsule Commonly known as: OMNICEF    traMADol  50 MG tablet Commonly known as: ULTRAM        TAKE these medications    acetaminophen  325 MG tablet Commonly known as: TYLENOL  Take 2 tablets (650 mg total) by mouth every 6 (six) hours as needed for mild pain (pain score 1-3).   allopurinol  100 MG tablet Commonly known as: ZYLOPRIM  Take 1 tablet (100 mg total) by mouth daily.   apixaban  5 MG Tabs tablet Commonly known as: ELIQUIS  Take 1 tablet (5 mg total) by mouth 2 (two) times daily.   atorvastatin  80 MG tablet Commonly known as: LIPITOR  Take 1 tablet (80 mg total) by mouth daily.   budesonide -formoterol  160-4.5 MCG/ACT inhaler Commonly known as: SYMBICORT  Inhale 2 puffs into the lungs 2 (two) times daily.   clotrimazole -betamethasone  cream Commonly known as: LOTRISONE  Apply 1 application topically 2 (two) times daily as needed (irritation).   colchicine  0.6 MG tablet Take 1 tablet (0.6 mg total) by mouth daily. What changed: when to take this   cyanocobalamin  1000 MCG/ML injection Commonly known as: VITAMIN B12 Inject 1 mL (  1,000 mcg total) into the muscle every 30 (thirty) days. Inject 1 ml (1000 mcg ) IM weekly x 4,  Then monthly thereafter   dapagliflozin  propanediol 10 MG Tabs tablet Commonly known as: FARXIGA  Take 1 tablet (10 mg total) by mouth daily.   dicyclomine  20 MG tablet Commonly known as: BENTYL  Take 1 tablet (20 mg total) by mouth 3 (three) times daily before meals.   Droplet Pen Needles 31G X 5 MM Misc Generic drug: Insulin  Pen Needle USE ONE NEEDLE SUBCUTANEOUSLY AS DIRECTED (REMOVE AND DISCARD NEEDLE IN SHARPS CONTAINER IMMEDIATELY AFTER USE)   DULoxetine  60 MG capsule Commonly known as: Cymbalta  Take 1 capsule (60 mg total) by mouth daily.   ezetimibe  10 MG tablet Commonly known as: ZETIA  Take 1 tablet (10 mg total) by mouth daily.   fexofenadine 180 MG  tablet Commonly known as: ALLEGRA Take 180 mg by mouth as needed for allergies or rhinitis.   fluticasone  50 MCG/ACT nasal spray Commonly known as: FLONASE  Place 2 sprays into both nostrils daily.   FreeStyle Libre 3 Plus Sensor Misc Change sensor every 15 days.   Gvoke HypoPen  2-Pack 0.5 MG/0.1ML Soaj Generic drug: Glucagon  Inject 1 auto injector subcutaneously as needed for severe hypoglycemia. May repeat x 1 after 15 minutes if needed.   Kerendia  10 MG Tabs Generic drug: Finerenone  Take 1 tablet (10 mg total) by mouth in the morning.   lidocaine  5 % Commonly known as: LIDODERM  PLACE 1 PATCH ONTO THE SKIN DAILY. REMOVE & DISCARD PATCH WITHIN 12 HOURS OR AS DIRECTED BY MD   loperamide  2 MG capsule Commonly known as: IMODIUM  Take 4-6 mg by mouth 3 (three) times daily before meals.   nitroGLYCERIN  0.4 MG SL tablet Commonly known as: NITROSTAT  Place 1 tablet (0.4 mg total) under the tongue every 5 (five) minutes as needed for chest pain.   NovoLOG  FlexPen 100 UNIT/ML FlexPen Generic drug: insulin  aspart Inject 8-14 Units into the skin 3 (three) times daily with meals.   nystatin  powder Commonly known as: MYCOSTATIN /NYSTOP  Apply 1 g topically 4 (four) times daily as needed (rash).   ondansetron  4 MG disintegrating tablet Commonly known as: ZOFRAN -ODT Take 1 tablet (4 mg total) by mouth every 8 (eight) hours as needed for nausea or vomiting.   OneTouch Delica Lancets 33G Misc Used to check blood sugar two times a day.   OneTouch Verio test strip Generic drug: glucose blood Use to check blood sugar twice daily.   OVER THE COUNTER MEDICATION Apply 1 application  topically daily as needed (pain). [place] Gel topical pain reliever   pantoprazole  40 MG tablet Commonly known as: PROTONIX  Take 1 tablet (40 mg total) by mouth daily.   potassium chloride  20 MEQ/15ML (10%) Soln Take 15 mLs (20 mEq total) by mouth daily.   pregabalin  50 MG capsule Commonly known as:  LYRICA  Take 1 capsule (50 mg total) by mouth 2 (two) times daily.   Premarin  vaginal cream Generic drug: conjugated estrogens  Apply one pea-sized amount around the opening of the urethra daily for 2 weeks, then 3 times weekly moving forward.   SYSTANE OP Place 1 drop into both eyes 4 (four) times daily as needed (dry eyes).   tiZANidine  4 MG tablet Commonly known as: ZANAFLEX  Take 1 tablet (4 mg total) by mouth in the morning and at bedtime.   torsemide  20 MG tablet Commonly known as: DEMADEX  Take 1 tablet (20 mg total) by mouth daily.   Tresiba  FlexTouch 100 UNIT/ML FlexTouch Pen  Generic drug: insulin  degludec Inject 25 Units into the skin daily.   Vitamin D3 250 MCG (10000 UT) capsule Take 10,000 Units by mouth daily.               Discharge Care Instructions  (From admission, onward)           Start     Ordered   03/15/24 0000  Discharge wound care:       Comments: Wound care  Daily      Comments: Left stump dressing change:.   Remove old dressing   Cover staple line with Xeroform gauze then   Cover with 4 x 4 gauze then   Cover with 2 ABD pads then   Wrap with Kerlix rolls x 2 then   Wrap stump with 6 inch Ace bandage snugly to reduce swelling.   Dressing to be changed daily.   03/15/24 1008            Contact information for follow-up providers     Marylynn Verneita CROME, MD Follow up.   Specialty: Internal Medicine Why: hospital follow up Contact information: [address] [place] [address] [phone]              Contact information for after-discharge care     Destination     [facility] .   Service: Skilled Nursing Contact information: [address] [place]  [address] [phone]                    Discharge Exam: Fredricka Weights   03/03/24 1826 03/08/24 0825  Weight: 110.3 kg 112.9 kg   GEN: NAD SKIN: Warm and dry EYES: No pallor or icterus ENT:  MMM CV: RRR PULM: CTA B ABD: soft, obese, NT, +BS CNS: AAO x 3, non focal EXT: Left BKA with intact dressing on stump wound   Condition at discharge: good  The results of significant diagnostics from this hospitalization (including imaging, microbiology, ancillary and laboratory) are listed below for reference.   Imaging Studies: DG Chest 1 View Result Date: 03/10/2024 CLINICAL DATA:  [id number] PNA (pneumonia) [id number] EXAM: CHEST  1 VIEW COMPARISON:  Chest x-ray 03/28/2023. FINDINGS: The heart and mediastinal contours are unchanged. Atherosclerotic plaque. No focal consolidation. No pulmonary edema. No pleural effusion. No pneumothorax. No acute osseous abnormality.  Reversed right shoulder arthroplasty. IMPRESSION: No active disease. Electronically Signed   By: Morgane  Naveau M.D.   On: 03/10/2024 17:15   CT HEAD WO CONTRAST ( ) Result Date: 03/08/2024 CLINICAL DATA:  Delirium EXAM: CT HEAD WITHOUT CONTRAST TECHNIQUE: Contiguous axial images were obtained from the base of the skull through the vertex without intravenous contrast. RADIATION DOSE REDUCTION: This exam was performed according to the departmental dose-optimization program which includes automated exposure control, adjustment of the mA and/or kV according to patient size and/or use of iterative reconstruction technique. COMPARISON:  CT head 02/08/2021.  MRI head 03/01/2022. FINDINGS: Brain: No evidence of acute infarction, hemorrhage, hydrocephalus, extra-axial collection or mass lesion/mass effect. Moderate to advanced patchy white matter hypodensities are nonspecific but compatible with chronic microvascular ischemic disease. Vascular: Calcific atherosclerosis. Skull: No acute fracture. Sinuses/Orbits: Clear sinuses.  No acute orbital findings. Other: No mastoid effusions. IMPRESSION: 1. No evidence of acute intracranial abnormality. 2. Moderate to advanced chronic microvascular ischemic disease. Electronically Signed   By: Gilmore GORMAN Molt M.D.   On: 03/08/2024 22:36   DG Knee 1-2 Views Left Result Date: 03/05/2024  CLINICAL DATA:  Left knee pain. EXAM: LEFT KNEE - 1-2 VIEW COMPARISON:  None Available. FINDINGS: There is a total left knee arthroplasty. The arthroplasty components appear intact and in anatomic alignment. There is no acute fracture or dislocation. The bones are osteopenic. A small suprapatellar effusion may be present. The soft tissues are unremarkable. IMPRESSION: 1. No acute fracture or dislocation. 2. Total left knee arthroplasty. Electronically Signed   By: Vanetta Chou M.D.   On: 03/05/2024 14:21   MR FOOT LEFT WO CONTRAST Result Date: 03/05/2024 CLINICAL DATA:  Osteomyelitis, foot Worsening left foot wounds.  History of diabetes and polyneuropathy. EXAM: MRI OF THE LEFT FOOT WITHOUT CONTRAST TECHNIQUE: Multiplanar, multisequence MR imaging of the left hindfoot was performed. No intravenous contrast was administered. COMPARISON:  Radiographs 03/03/2024, CT 01/24/2024 and MRI 05/07/2008. FINDINGS: Bones/Joint/Cartilage As stated in the technique section, this examination is targeted to the hindfoot and ankle. The midfoot is included. The forefoot is incompletely visualized. As shown on prior imaging, there are severe Charcot changes within the hindfoot and midfoot. There is chronic fragmentation and partial destruction of the metatarsal bases, cuneiform bones, cuboid, navicular, talus and calcaneus. Compared with the previous CT, there is new, near complete medial dislocation at the subtalar joint. There is chronic dorsal and medial dislocation at the transverse tarsal joint. There is nonspecific, patchy T2 hyperintensity throughout the talus without gross bone destruction. There are complex ankle and subtalar joint effusions. Ligaments No acute ligamentous findings are demonstrated. The deltoid ligament appears thickened. Muscles and Tendons There is a longitudinal split tear the peroneus brevis tendon associated  with mild peroneal tendon sheath fluid. The medial flexor tendons are intact, although the posterior tibialis tendon appears stretched due to the subtalar dislocation. The anterior extensor and Achilles tendons are intact. The plantar fascia is intact. Mild generalized muscular edema without apparent focal abnormality. Soft tissues Generalized subcutaneous edema. Possible skin ulceration laterally in the hindfoot without evidence of underlying organized fluid collection. IMPRESSION: 1. Severe Charcot changes within the hindfoot and midfoot with chronic dorsal and medial dislocation at the transverse tarsal joint and new, near complete medial dislocation at the subtalar joint. 2. Nonspecific patchy T2 hyperintensity throughout the talus without gross bone destruction. This could be reactive or secondary to early osteomyelitis. 3. Complex ankle and subtalar joint effusions. 4. Longitudinal split tear of the peroneus brevis tendon. 5. Generalized subcutaneous edema with possible skin ulceration laterally in the hindfoot. Correlate clinically. No evidence of soft tissue abscess. Electronically Signed   By: Elsie Perone M.D.   On: 03/05/2024 08:24   DG Foot Complete Left Result Date: 03/03/2024 CLINICAL DATA:  Cellulitis EXAM: LEFT FOOT - COMPLETE 3+ VIEW COMPARISON:  CT 01/24/2024, radiograph 11/27/2023 FINDINGS: Vascular calcifications and generalized soft tissue swelling. Prior partial resection of the fifth metatarsal. Chronic irregular arthropathy at the second third and fourth MTP joints. Advanced osseous destructive change and fragmentation of the midfoot, consistent with Charcot arthropathy, suspect that there is progressive erosive change and osseous destruction of the calcaneus, talus, cuboid navicular and cuneiform bones compared with the radiograph from May as there appears to be progressive subtalar collapse. There is increased effusion and soft tissue swelling compared with exam from May. Moderate  plantar calcaneal spur. IMPRESSION: 1. Advanced osseous destructive change and fragmentation of the midfoot, consistent with Charcot arthropathy, suspect that there is progressive erosive change and osseous destruction of the calcaneus, talus, cuboid navicular and cuneiform bones compared with the radiograph from May. There is increased  effusion and soft tissue swelling compared with exam from May. Findings are concerning for progressive Charcot arthropathy versus superimposed osteomyelitis. 2. Prior partial resection of the fifth metatarsal. Chronic irregular arthropathy at the second third and fourth MTP joints. Chronic degenerative change at the first IP and MTP joints. Electronically Signed   By: Luke Bun M.D.   On: 03/03/2024 19:37    Microbiology: Results for orders placed or performed during the hospital encounter of 03/03/24  Blood culture (routine x 2)     Status: Abnormal   Collection Time: 03/03/24  5:01 PM   Specimen: BLOOD RIGHT FOREARM  Result Value Ref Range Status   Specimen Description   Final    BLOOD RIGHT FOREARM Performed at [facility], [address] [place], [place] [address]    Special Requests   Final    BOTTLES DRAWN AEROBIC AND ANAEROBIC Blood Culture results may not be optimal due to an inadequate volume of blood received in culture bottles Performed at [facility], [address] [place], [place] [address]    Culture  Setup Time   Final    GRAM POSITIVE COCCI ANAEROBIC BOTTLE ONLY CRITICAL RESULT CALLED TO, READ BACK BY AND VERIFIED WITH: TIFFANY GILCHRIST, PHARMD AT 1010 ON 03/04/24 GM    Culture (A)  Final    STAPHYLOCOCCUS HOMINIS THE SIGNIFICANCE OF ISOLATING THIS ORGANISM FROM A SINGLE SET OF BLOOD CULTURES WHEN MULTIPLE SETS ARE DRAWN IS UNCERTAIN. PLEASE NOTIFY THE MICROBIOLOGY DEPARTMENT WITHIN ONE WEEK IF SPECIATION AND SENSITIVITIES ARE REQUIRED. Performed at [facility], [address] [place], [place] [address]     Report Status 03/06/2024 FINAL  Final  Blood Culture ID Panel (Reflexed)     Status: Abnormal   Collection Time: 03/03/24  5:01 PM  Result Value Ref Range Status   Enterococcus faecalis NOT DETECTED NOT DETECTED Final   Enterococcus Faecium NOT DETECTED NOT DETECTED Final   Listeria monocytogenes NOT DETECTED NOT DETECTED Final   Staphylococcus species DETECTED (A) NOT DETECTED Final    Comment: CRITICAL RESULT CALLED TO, READ BACK BY AND VERIFIED WITH: TIFFANY GILCHRIST, PHARMD AT 1010 ON 03/04/24 GM    Staphylococcus aureus (BCID) NOT DETECTED NOT DETECTED Final   Staphylococcus epidermidis NOT DETECTED NOT DETECTED Final   Staphylococcus lugdunensis NOT DETECTED NOT DETECTED Final   Streptococcus species NOT DETECTED NOT DETECTED Final   Streptococcus agalactiae NOT DETECTED NOT DETECTED Final   Streptococcus pneumoniae NOT DETECTED NOT DETECTED Final   Streptococcus pyogenes NOT DETECTED NOT DETECTED Final   A.calcoaceticus-baumannii NOT DETECTED NOT DETECTED Final   Bacteroides fragilis NOT DETECTED NOT DETECTED Final   Enterobacterales NOT DETECTED NOT DETECTED Final   Enterobacter cloacae complex NOT DETECTED NOT DETECTED Final   Escherichia coli NOT DETECTED NOT DETECTED Final   Klebsiella aerogenes NOT DETECTED NOT DETECTED Final   Klebsiella oxytoca NOT DETECTED NOT DETECTED Final   Klebsiella pneumoniae NOT DETECTED NOT DETECTED Final   Proteus species NOT DETECTED NOT DETECTED Final   Salmonella species NOT DETECTED NOT DETECTED Final   Serratia marcescens NOT DETECTED NOT DETECTED Final   Haemophilus influenzae NOT DETECTED NOT DETECTED Final   Neisseria meningitidis NOT DETECTED NOT DETECTED Final   Pseudomonas aeruginosa NOT DETECTED NOT DETECTED Final   Stenotrophomonas maltophilia NOT DETECTED NOT DETECTED Final   Candida albicans NOT DETECTED NOT DETECTED Final   Candida auris NOT DETECTED NOT DETECTED Final   Candida glabrata NOT DETECTED NOT DETECTED Final  Candida krusei NOT DETECTED NOT DETECTED Final   Candida parapsilosis NOT DETECTED NOT DETECTED Final   Candida tropicalis NOT DETECTED NOT DETECTED Final   Cryptococcus neoformans/gattii NOT DETECTED NOT DETECTED Final    Comment: Performed at [facility], [address] [place], [place] [address]  Blood culture (routine x 2)     Status: None   Collection Time: 03/03/24  7:01 PM   Specimen: BLOOD  Result Value Ref Range Status   Specimen Description BLOOD BLOOD RIGHT HAND  Final   Special Requests   Final    BOTTLES DRAWN AEROBIC AND ANAEROBIC Blood Culture results may not be optimal due to an inadequate volume of blood received in culture bottles   Culture   Final    NO GROWTH 5 DAYS Performed at [facility], [address] [place], [place] [address]    Report Status 03/08/2024 FINAL  Final  Culture, blood (Routine X 2) w Reflex to ID Panel     Status: None   Collection Time: 03/10/24  1:31 PM   Specimen: BLOOD  Result Value Ref Range Status   Specimen Description BLOOD BLOOD RIGHT HAND  Final   Special Requests   Final    BOTTLES DRAWN AEROBIC ONLY Blood Culture results may not be optimal due to an inadequate volume of blood received in culture bottles   Culture   Final    NO GROWTH 5 DAYS Performed at [facility], [address] [place], [place] [address]    Report Status 03/15/2024 FINAL  Final  Culture, blood (Routine X 2) w Reflex to ID Panel     Status: None   Collection Time: 03/10/24  3:07 PM   Specimen: BLOOD  Result Value Ref Range Status   Specimen Description BLOOD BLOOD RIGHT HAND  Final   Special Requests   Final    BOTTLES DRAWN AEROBIC AND ANAEROBIC Blood Culture adequate volume   Culture   Final    NO GROWTH 5 DAYS Performed at [facility], [address] [place], [place] [address]    Report Status 03/15/2024 FINAL  Final   *Note: Due to a large number of results and/or encounters for the requested time  period, some results have not been displayed. A complete set of results can be found in Results Review.    Labs: CBC: Recent Labs  Lab 03/10/24 0440 03/11/24 0437 03/12/24 0549 03/13/24 0636 03/15/24 0812  WBC 15.1* 15.3* 13.9* 13.2* 11.5*  NEUTROABS  --   --   --   --  6.1  HGB 9.0* 8.7* 8.9* 8.6* 9.4*  HCT 28.5* 27.6* 28.8* 27.7* 29.6*  MCV 90.8 90.2 90.3 91.1 90.5  PLT 408* 432* 445* 456* 455*   Basic Metabolic Panel: Recent Labs  Lab 03/10/24 0440 03/11/24 0437 03/12/24 0549 03/13/24 0636 03/15/24 0812  NA 136 139 140 141 143  K 3.9 4.0 4.0 3.4* 3.9  CL 103 103 106 106 108  CO2 22 23 25 25 25   GLUCOSE 115* 137* 162* 128* 162*  BUN 30* 25* 25* 21 14  CREATININE 1.54* 1.57* 1.57* 1.63* 1.36*  CALCIUM  8.7* 8.8* 8.3* 8.2* 8.7*   Liver Function Tests: No results for input(s): AST, ALT, ALKPHOS, BILITOT, PROT, ALBUMIN  in the last 168 hours. CBG: Recent Labs  Lab 03/14/24 2021 03/15/24 0742 03/15/24 1211 03/15/24 1638 03/16/24 0747  GLUCAP 183* 160* 140* 135* 175*    Discharge time spent: greater than 30 minutes.  Signed: Aadit Hagood  JENS, MD [facility] 03/16/2024

## 2024-03-17 ENCOUNTER — Ambulatory Visit: Admitting: Physician Assistant

## 2024-03-17 DIAGNOSIS — D513 Other dietary vitamin B12 deficiency anemia: Secondary | ICD-10-CM | POA: Diagnosis not present

## 2024-03-18 ENCOUNTER — Other Ambulatory Visit: Payer: Self-pay

## 2024-03-18 DIAGNOSIS — I5032 Chronic diastolic (congestive) heart failure: Secondary | ICD-10-CM

## 2024-03-18 MED ORDER — KERENDIA 10 MG PO TABS: 10.0000 mg | ORAL_TABLET | Freq: Every morning | ORAL | 3 refills | Status: AC

## 2024-03-19 ENCOUNTER — Telehealth: Payer: Self-pay | Admitting: Internal Medicine

## 2024-03-19 ENCOUNTER — Telehealth: Payer: Self-pay | Admitting: Physician Assistant

## 2024-03-19 ENCOUNTER — Telehealth: Payer: Self-pay

## 2024-03-19 DIAGNOSIS — E119 Type 2 diabetes mellitus without complications: Secondary | ICD-10-CM | POA: Diagnosis not present

## 2024-03-19 DIAGNOSIS — I959 Hypotension, unspecified: Secondary | ICD-10-CM | POA: Diagnosis not present

## 2024-03-19 DIAGNOSIS — M1009 Idiopathic gout, multiple sites: Secondary | ICD-10-CM | POA: Diagnosis not present

## 2024-03-19 DIAGNOSIS — M86172 Other acute osteomyelitis, left ankle and foot: Secondary | ICD-10-CM | POA: Diagnosis not present

## 2024-03-19 NOTE — Telephone Encounter (Signed)
 Patient called today and lvm that she recently had leg amputated (chart shows amputation below knee; left: leg lower). She is asking if she should continue with PTNS treatment, or discontinue. Please advise patient.

## 2024-03-19 NOTE — Telephone Encounter (Signed)
 Spoke with pt to let her know that she will not be able to keep her appt while she is in rehab because she is under the care of the provider there. Pt gave a verbal understanding. Appt has been scheduled and pt was advised to call back when she is discharged to reschedule the appt.

## 2024-03-19 NOTE — Telephone Encounter (Signed)
 Copied from CRM [id number]. Topic: Clinical - Medical Advice >> Mar 19, 2024 11:29 AM Rea ORN wrote: Reason for CRM: Pt was admitted to [facility] (Room 809) Monday following leg amputation. She want to know if she should keep her 9/26 appointment since she will still be in the nursing home. Please call [phone] to advise.

## 2024-03-19 NOTE — Telephone Encounter (Signed)
 Advised patient per Denise Sharp it was okay to continue her treatment in the opposite ankle of amputation. She is in a nursing facility at the moment and will arrange transportation for next appointment on 03/24/2024 @ 1:40pm. Patient voiced understanding.

## 2024-03-19 NOTE — Telephone Encounter (Signed)
 Patient called to see if she needs to keep her appointment since she is in [facility] room 809 after having her leg amputated? She is scheduled for lab/md/iron  infusion on 9/15. Please advise.  Thank you

## 2024-03-19 NOTE — Telephone Encounter (Signed)
 1st attempt to call patient unsuccessful. Voicemail left for patient to return call to the office.

## 2024-03-23 ENCOUNTER — Ambulatory Visit: Payer: Self-pay | Admitting: Internal Medicine

## 2024-03-23 NOTE — Progress Notes (Signed)
 Sed rate 80 and CRP 89.8 elevated. Uric acid of 7.8 is above goal of 6 but also not reliable indicator if her joint swelling does indicate current flare. Really need to recheck after starting allopurinol  and when not in a flare. eGFR of 42 with hyperurecemia 2/2 CKD. 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

## 2024-03-24 ENCOUNTER — Ambulatory Visit (INDEPENDENT_AMBULATORY_CARE_PROVIDER_SITE_OTHER): Admitting: Physician Assistant

## 2024-03-24 DIAGNOSIS — N3946 Mixed incontinence: Secondary | ICD-10-CM

## 2024-03-24 NOTE — Progress Notes (Signed)
 PTNS  Session # 4  Health & Social Factors: Left BKA since last treatment 3 weeks ago. At [facility]. Caffeine : 1 Alcohol : 0 Daytime voids #per day: 5 Night-time voids #per night: 2 Urgency: mild Incontinence Episodes #per day: 7 Ankle used: right Treatment Setting: 19 Feeling/ Response: sensory Comments: Patient tolerated well.  Performed By: Olamae Ferrara, PA-C   Follow Up: 1 week

## 2024-03-25 ENCOUNTER — Ambulatory Visit: Admitting: Podiatry

## 2024-03-26 DIAGNOSIS — R070 Pain in throat: Secondary | ICD-10-CM | POA: Diagnosis not present

## 2024-03-26 DIAGNOSIS — K121 Other forms of stomatitis: Secondary | ICD-10-CM | POA: Diagnosis not present

## 2024-03-26 DIAGNOSIS — R443 Hallucinations, unspecified: Secondary | ICD-10-CM | POA: Diagnosis not present

## 2024-03-30 ENCOUNTER — Ambulatory Visit

## 2024-03-30 ENCOUNTER — Other Ambulatory Visit

## 2024-03-30 ENCOUNTER — Ambulatory Visit: Admitting: Internal Medicine

## 2024-03-30 DIAGNOSIS — J309 Allergic rhinitis, unspecified: Secondary | ICD-10-CM | POA: Diagnosis not present

## 2024-03-30 DIAGNOSIS — N179 Acute kidney failure, unspecified: Secondary | ICD-10-CM | POA: Diagnosis not present

## 2024-03-30 DIAGNOSIS — R443 Hallucinations, unspecified: Secondary | ICD-10-CM | POA: Diagnosis not present

## 2024-03-31 ENCOUNTER — Encounter: Payer: Self-pay | Admitting: Physician Assistant

## 2024-03-31 ENCOUNTER — Ambulatory Visit (INDEPENDENT_AMBULATORY_CARE_PROVIDER_SITE_OTHER): Admitting: Physician Assistant

## 2024-03-31 VITALS — BP 95/61 | HR 96 | Ht 64.5 in | Wt 230.0 lb

## 2024-03-31 DIAGNOSIS — N3946 Mixed incontinence: Secondary | ICD-10-CM

## 2024-03-31 NOTE — Progress Notes (Signed)
 PTNS  Session # 5  Health & Social Factors: no change Caffeine : 5 Alcohol : 0 Daytime voids #per day: 8-9 Night-time voids #per night: 2 Urgency: strong Incontinence Episodes #per day: 12 Ankle used: right Treatment Setting: 15 Feeling/ Response: sensory Comments: Patient tolerated well.  Performed By: Yeslin Delio, PA-C   Follow Up: 1 week

## 2024-04-02 DIAGNOSIS — J309 Allergic rhinitis, unspecified: Secondary | ICD-10-CM | POA: Diagnosis not present

## 2024-04-02 DIAGNOSIS — N390 Urinary tract infection, site not specified: Secondary | ICD-10-CM | POA: Diagnosis not present

## 2024-04-03 ENCOUNTER — Encounter (INDEPENDENT_AMBULATORY_CARE_PROVIDER_SITE_OTHER): Payer: Self-pay | Admitting: Vascular Surgery

## 2024-04-03 ENCOUNTER — Ambulatory Visit (INDEPENDENT_AMBULATORY_CARE_PROVIDER_SITE_OTHER): Admitting: Vascular Surgery

## 2024-04-03 VITALS — BP 130/56 | HR 42

## 2024-04-03 DIAGNOSIS — Z89612 Acquired absence of left leg above knee: Secondary | ICD-10-CM

## 2024-04-03 NOTE — Progress Notes (Signed)
 Subjective:    Patient ID: Denise Sharp, female    DOB: 1946/05/30, 78 y.o.   MRN: [id number] Chief Complaint  Patient presents with   Follow-up     fu 3 weeks wound ck     Denise Sharp is a 78 yo female who presents to clinic today for 3 week post operative check from left lower extremity above the knee amputation.  Patient endorses she continues to rehab at [facility].  She has no complaints related to her above-the-knee amputation.  However she does endorse some difficulties with tremors which started before she had entered the hospital.  Patient endorses she has a follow-up with rheumatology and she is not sure how this relates to her tremors but she thought it was for her tremors.  I recommend at this time that the patient follow-up with neurology for these tremors.  Her left stump today has scabbing to the incision line and staples are in place.  These will be removed here today.  And stump will be redressed and rewrapped.    Review of Systems  Constitutional: Negative.   Cardiovascular:  Positive for leg swelling.  Skin:  Positive for color change and wound.  All other systems reviewed and are negative.      Objective:   Physical Exam Vitals reviewed.  Constitutional:      Appearance: Normal appearance. She is obese.  HENT:     Head: Normocephalic.  Eyes:     Pupils: Pupils are equal, round, and reactive to light.  Cardiovascular:     Rate and Rhythm: Normal rate and regular rhythm.     Pulses: Normal pulses.     Heart sounds: Normal heart sounds.  Pulmonary:     Effort: Pulmonary effort is normal.     Breath sounds: Normal breath sounds.  Abdominal:     General: Bowel sounds are normal.     Palpations: Abdomen is soft.  Musculoskeletal:        General: Tenderness present.     Comments: Status post left above-the-knee amputation  Skin:    General: Skin is warm and dry.     Capillary Refill: Capillary refill takes 2 to 3 seconds.  Neurological:      General: No focal deficit present.     Mental Status: She is alert and oriented to person, place, and time. Mental status is at baseline.  Psychiatric:        Mood and Affect: Mood normal.        Behavior: Behavior normal.        Thought Content: Thought content normal.        Judgment: Judgment normal.     BP (!) 130/56   Pulse (!) 42   Past Medical History:  Diagnosis Date   (HFpEF) heart failure with preserved ejection fraction (HCC)    a. 12/2019 Echo: EF 65-70%, Gr2 DD. No significant valvular dzs.   Abnormality of gait 03/25/2013   Adrenal mass, left (HCC)    Anginal pain (HCC)    Anxiety    Aortic atherosclerosis (HCC)    Arthritis    Asthma    CAD (coronary artery disease) with h/o Atypical Chest Pain    a. 04/1986 Cath ([facility]): nl cors, EF 65%; b. 07/2012 Cath [facility]): Diff minor irregs; c. 07/2016 [facility]): Equivocal; d. 08/2016 Cardiac CT Ca2+ score [facility]): Ca2+ 1548; e. 05/2019 [facility]: No ischemia. EF 75%; f. 12/2019 PCI: LM nl, LAD 65ost (3.5x12 Resolute DES), 23m (2.75x12  Resolute DES),LCX/RCA nl; g. 08/2020 Cath: patent LAD stents, RCA 40ost, elev RH pressures, EF >65%.   Cervical spinal stenosis 1994   due to trauma to back ([employer] accident), has intermittent paralysis and parasthesias   Cervicogenic headache 03/23/2014   CKD (chronic kidney disease), stage III (HCC)    Depression    Diverticulosis    Dizziness    a.) chronic   DJD (degenerative joint disease)    a. Chronic R shoulder pain   Dyspnea    Esophageal stenosis 09/2009   a.) transient outlet obstruction by food, cleared by EGD   Family history of adverse reaction to anesthesia    a.) daughter with (+) PONV   Gastric bypass status for obesity    GERD (gastroesophageal reflux disease)    Gout    Headache(784.0)    HLD (hyperlipidemia)    Hypertension    IBS (irritable bowel syndrome)    IDA (iron  deficiency anemia)    a.) post 2 unit txfsn 2009, normal endo/colonoscopy by [facility]   Left bundle branch  block (LBBB)    a.) Intermittently present - likely rate related.   NSVT (nonsustained ventricular tachycardia) (HCC) 11/15/2020   a.) Holter study 11/15/2020; 11 episodes of NSVT lasting up to 8 beats with a maximum rate of 210 bpm; 14 atrial runs lasting up to 18 beats with a rate of up to 250 bpm.  Some atrial runs felt to be NSVT.   NSVT (nonsustained ventricular tachycardia) (HCC)    a. 10/2020 [facility]: 11 runs of NSVT up to 8 beats.   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 10/2020 [facility]: 14 atrial runs up to 18 beats, max HR 250.   Restless leg syndrome    Rotator cuff arthropathy, right 08/13/2017   Syncope and collapse 03/12/2014   Type II diabetes mellitus (HCC)     Social History   Socioeconomic History   Marital status: Married    Spouse name: Koren    Number of children: 2   Years of education: College    Highest education level: Not on file  Occupational History    Employer: retired  Tobacco Use   Smoking status: Never    Passive exposure: Never   Smokeless tobacco: Never  Vaping Use   Vaping status: Never Used  Substance and Sexual Activity   Alcohol  use: No   Drug use: Not Currently    Comment: prescribed valium    Sexual activity: Not Currently    Birth control/protection: Post-menopausal, Surgical  Other Topics Concern   Not on file  Social History Narrative   Patient lives at home with husband Koren.    Right Handed   Drinks caffeinated tea occasionally   One pet in house, dog   Social Drivers of Health   Financial Resource Strain: Low Risk  (09/15/2021)   Overall Financial Resource Strain (CARDIA)    Difficulty of Paying Living Expenses: Not hard at all  Food Insecurity: No Food Insecurity (03/04/2024)   Hunger Vital Sign    Worried About Running Out of Food in the Last Year: Never true    Ran Out of Food  in the Last Year: Never true  Transportation Needs: No Transportation Needs (03/04/2024)   PRAPARE - Administrator, Civil Service (Medical): No    Lack of Transportation (Non-Medical):  No  Physical Activity: Insufficiently Active (11/15/2020)   Exercise Vital Sign    Days of Exercise per Week: 2 days    Minutes of Exercise per Session: 40 min  Stress: Stress Concern Present (08/03/2020)   [employer] of Occupational Health - Occupational Stress Questionnaire    Feeling of Stress : Rather much  Social Connections: Unknown (03/04/2024)   Social Connection and Isolation Panel    Frequency of Communication with Friends and Family: Patient declined    Frequency of Social Gatherings with Friends and Family: Patient declined    Attends Religious Services: Never    Database administrator or Organizations: Patient declined    Attends Banker Meetings: Patient declined    Marital Status: Patient declined  Intimate Partner Violence: Not At Risk (03/04/2024)   Humiliation, Afraid, Rape, and Kick questionnaire    Fear of Current or Ex-Partner: No    Emotionally Abused: No    Physically Abused: No    Sexually Abused: No    Past Surgical History:  Procedure Laterality Date   AMPUTATION Left 03/07/2024   Procedure: AMPUTATION BELOW KNEE;  Surgeon: Tisa Curry LABOR, MD;  Location: [facility];  Service: Vascular;  Laterality: Left;   APPENDECTOMY     BACK SURGERY     CARDIOVASCULAR STRESS TEST     CARDIOVERSION N/A 10/30/2022   Procedure: CARDIOVERSION;  Surgeon: Mady Bruckner, MD;  Location: [facility];  Service: Cardiovascular;  Laterality: N/A;   CHOLECYSTECTOMY N/A 1976   COLONOSCOPY     CORONARY ANGIOPLASTY     CORONARY STENT INTERVENTION N/A 01/04/2020   Procedure: CORONARY STENT INTERVENTION;  Surgeon: Darron Deatrice LABOR, MD;  Location: [facility];  Service: Cardiovascular;  Laterality: N/A;  LAD    DIAPHRAGMATIC HERNIA REPAIR  2015   ESOPHAGEAL DILATION      multiple   ESOPHAGOGASTRODUODENOSCOPY (EGD) WITH PROPOFOL  N/A 05/11/2021   Procedure: ESOPHAGOGASTRODUODENOSCOPY (EGD) WITH PROPOFOL ;  Surgeon: Jinny Carmine, MD;  Location: [facility];  Service: Endoscopy;  Laterality: N/A;   ESOPHAGOGASTRODUODENOSCOPY (EGD) WITH PROPOFOL  N/A 06/13/2021   Procedure: ESOPHAGOGASTRODUODENOSCOPY (EGD) WITH PROPOFOL ;  Surgeon: Jinny Carmine, MD;  Location: [facility];  Service: Endoscopy;  Laterality: N/A;   ESOPHAGOGASTRODUODENOSCOPY (EGD) WITH PROPOFOL  N/A 02/28/2023   Procedure: ESOPHAGOGASTRODUODENOSCOPY (EGD) WITH PROPOFOL ;  Surgeon: Jinny Carmine, MD;  Location: [facility];  Service: Endoscopy;  Laterality: N/A;   EYE SURGERY     GASTRIC BYPASS  2000, 2005   Dr. Mia REDBIRD IMPLANT PLACEMENT  10/2011   Ike REDBIRD IMPLANT PLACEMENT N/A 09/18/2021   Procedure: REDBIRD IMPLANT FIRST STAGE;  Surgeon: Gaston Hamilton, MD;  Location: [facility];  Service: Urology;  Laterality: N/A;   INTERSTIM IMPLANT PLACEMENT N/A 09/18/2021   Procedure: REDBIRD IMPLANT SECOND STAGE WITH IMPEDENCE CHECK;  Surgeon: Gaston Hamilton, MD;  Location: [facility];  Service: Urology;  Laterality: N/A;   INTERSTIM IMPLANT REMOVAL N/A 09/18/2021   Procedure: REMOVAL OF INTERSTIM IMPLANT;  Surgeon: Gaston Hamilton, MD;  Location: [facility];  Service: Urology;  Laterality: N/A;   LEFT HEART CATH AND CORONARY ANGIOGRAPHY N/A 04/1986   LEFT HEART CATH AND CORONARY ANGIOGRAPHY Left 07/24/2012   LEFT HEART CATH AND CORS/GRAFTS ANGIOGRAPHY N/A 12/31/2019   Procedure: LEFT HEART CATH AND CORS/GRAFTS ANGIOGRAPHY;  Surgeon: Perla Evalene PARAS, MD;  Location: [facility];  Service: Cardiovascular;  Laterality: N/A;   PANNICULECTOMY N/A 06/16/2019   Procedure: PANNICULECTOMY;  Surgeon: Elisabeth Craig RAMAN, MD;  [facility];  Service: Plastics;  Laterality: N/A;  3 hours, please   REVERSE SHOULDER ARTHROPLASTY Right 08/13/2017   Procedure: REVERSE RIGHT  SHOULDER ARTHROPLASTY;  Surgeon: Josefina Chew, MD;  Location: [facility];  Service: Orthopedics;  Laterality: Right;   RIGHT/LEFT HEART CATH AND CORONARY ANGIOGRAPHY N/A 09/06/2020   Procedure: RIGHT/LEFT HEART CATH AND CORONARY ANGIOGRAPHY;  Surgeon: Mady Bruckner, MD;  Location: [facility];  Service: Cardiovascular;  Laterality: N/A;   RIGHT/LEFT HEART CATH AND CORONARY ANGIOGRAPHY N/A 06/04/2022   Procedure: RIGHT/LEFT HEART CATH AND CORONARY ANGIOGRAPHY;  Surgeon: Mady Bruckner, MD;  Location: [facility];  Service: Cardiovascular;  Laterality: N/A;   ROTATOR CUFF REPAIR Right    SPINE SURGERY  1995   [place]   TOOTH EXTRACTION  11/13/2021   TOTAL ABDOMINAL HYSTERECTOMY W/ BILATERAL SALPINGOOPHORECTOMY  1974   TOTAL KNEE ARTHROPLASTY Bilateral 2007   Procedure: TOTAL KNEE ARTHROPLASTY; Surgeons: Kathi, MD and [name], MD   UMBILICAL HERNIA REPAIR  08/11/2015    Family History  Problem Relation Age of Onset   Heart disease Father    Hypertension Father    Prostate cancer Father    Stroke Father    Osteoporosis Father    Stroke Mother    Depression Mother    Headache Mother    Heart disease Mother    Thyroid  disease Mother    Hypertension Mother    Diabetes Daughter    Heart disease Daughter    Hypertension Daughter    Hypertension Son     Allergies  Allergen Reactions   Amoxicillin  Diarrhea    amoxicillin    Clarithromycin  Nausea And Vomiting and Other (See Comments)    Severe irritable bowel  clarithromycin    Demeclocycline Hives   Erythromycin Nausea And Vomiting and Other (See Comments)    Severe irritable bowel   Flagyl [Metronidazole] Nausea And Vomiting and Other (See Comments)    Severe irritable bowel   Glucophage [Metformin Hcl] Nausea And Vomiting and Other (See Comments)    Sick I won't take anything that has metformin in it   Tetracyclines & Related Hives and Rash   Potassium Chloride  Other (See Comments)    Cannot swallow  potssium pills, able to tolerate liquid potassium   Tetracycline Other (See Comments)   Diovan [Valsartan] Nausea Only        Sulfa  Antibiotics Other (See Comments) and Rash    As child   Xanax [Alprazolam] Other (See Comments)    Hyperactivity        Latest Ref Rng & Units 03/15/2024    8:12 AM 03/13/2024    6:36 AM 03/12/2024    5:49 AM  CBC  WBC 4.0 - 10.5 K/uL 11.5  13.2  13.9   Hemoglobin 12.0 - 15.0 g/dL 9.4  8.6  8.9   Hematocrit 36.0 - 46.0 % 29.6  27.7  28.8   Platelets 150 - 400 K/uL 455  456  445       CMP     Component Value Date/Time   NA 143 03/15/2024 0812   NA 137 10/21/2023 1417   NA 141 06/21/2014 1349   K 3.9 03/15/2024 0812   K 4.0 06/21/2014 1349   CL 108 03/15/2024 0812   CL 106 06/21/2014 1349   CO2 25 03/15/2024 0812   CO2 24 06/21/2014 1349   GLUCOSE 162 (H) 03/15/2024 0812   GLUCOSE 174 (H) 06/21/2014 1349   BUN 14 03/15/2024 0812   BUN 30 (H)  10/21/2023 1417   BUN 12 06/21/2014 1349   CREATININE 1.36 (H) 03/15/2024 0812   CREATININE 1.30 (H) 01/24/2024 1009   CALCIUM  8.7 (L) 03/15/2024 0812   CALCIUM  8.7 06/21/2014 1349   PROT 6.9 03/03/2024 1452   PROT 7.3 10/30/2012 1436   ALBUMIN  2.9 (L) 03/03/2024 1452   ALBUMIN  3.0 (L) 10/30/2012 1436   AST 18 03/03/2024 1452   AST 20 11/28/2023 1308   ALT 14 03/03/2024 1452   ALT 17 11/28/2023 1308   ALT 19 10/30/2012 1436   ALKPHOS 165 (H) 03/03/2024 1452   ALKPHOS 125 10/30/2012 1436   BILITOT 0.7 03/03/2024 1452   BILITOT 0.5 11/28/2023 1308   GFR 53.35 (L) 03/03/2024 1111   EGFR 42 (L) 01/24/2024 1009   EGFR 38 (L) 10/21/2023 1417   GFRNONAA 40 (L) 03/15/2024 0812   GFRNONAA 41 (L) 11/28/2023 1308   GFRNONAA 57 (L) 06/21/2014 1349   GFRNONAA 55 (L) 02/25/2014 2153   GFRNONAA 70 12/13/2011 0919     No results found.     Assessment & Plan:   1. S/P AKA (above knee amputation), left (HCC) (Primary) Patient returns to clinic today post operative from Left lower extremity AKA from  3 weeks ago. Staples were removed today.   Incision redressed today with Xeroform across incision line covered with ABD pads and wrapped with ACE Bandage very snugly. Patient to follow up in 3 weeks for another wound check. Patient continues to rehab at [facility].    Current Outpatient Medications on File Prior to Visit  Medication Sig Dispense Refill   acetaminophen  (TYLENOL ) 325 MG tablet Take 2 tablets (650 mg total) by mouth every 6 (six) hours as needed for mild pain (pain score 1-3).     Cholecalciferol  (VITAMIN D3) 250 MCG (10000 UT) capsule Take 10,000 Units by mouth daily.     clotrimazole -betamethasone  (LOTRISONE ) cream Apply 1 application topically 2 (two) times daily as needed (irritation).     conjugated estrogens  (PREMARIN ) vaginal cream Apply one pea-sized amount around the opening of the urethra daily for 2 weeks, then 3 times weekly moving forward. 30 g 4   Continuous Glucose Sensor (FREESTYLE LIBRE 3 PLUS SENSOR) MISC Change sensor every 15 days. 6 each 3   dicyclomine  (BENTYL ) 20 MG tablet Take 1 tablet (20 mg total) by mouth 3 (three) times daily before meals. 180 tablet 2   Finerenone  (KERENDIA ) 10 MG TABS Take 1 tablet (10 mg total) by mouth in the morning. 90 tablet 3   fluticasone  (FLONASE ) 50 MCG/ACT nasal spray Place 2 sprays into both nostrils daily. 16 g 6   Glucagon  (GVOKE HYPOPEN  2-PACK) 0.5 MG/0.1ML SOAJ Inject 1 auto injector subcutaneously as needed for severe hypoglycemia. May repeat x 1 after 15 minutes if needed. 0.1 mL 2   glucose blood (ONETOUCH VERIO) test strip Use to check blood sugar twice daily. 100 each 1   insulin  aspart (NOVOLOG  FLEXPEN) 100 UNIT/ML FlexPen Inject 8-14 Units into the skin 3 (three) times daily with meals. 36 mL 3   insulin  degludec (TRESIBA  FLEXTOUCH) 100 UNIT/ML FlexTouch Pen Inject 25 Units into the skin daily. 15 mL 3   Insulin  Pen Needle (DROPLET PEN NEEDLES) 31G X 5 MM MISC USE ONE NEEDLE SUBCUTANEOUSLY AS DIRECTED (REMOVE  AND DISCARD NEEDLE IN SHARPS CONTAINER IMMEDIATELY AFTER USE) 100 each 1   loperamide  (IMODIUM ) 2 MG capsule Take 4-6 mg by mouth 3 (three) times daily before meals.     nystatin  (MYCOSTATIN /NYSTOP ) powder  Apply 1 g topically 4 (four) times daily as needed (rash).     OneTouch Delica Lancets 33G MISC Used to check blood sugar two times a day. 100 each 4   OVER THE COUNTER MEDICATION Apply 1 application  topically daily as needed (pain). [place] Gel topical pain reliever     oxycodone  (OXY-IR) 5 MG capsule Take 5 mg by mouth every 6 (six) hours as needed.     pantoprazole  (PROTONIX ) 40 MG tablet Take 1 tablet (40 mg total) by mouth daily. 90 tablet 3   Polyethyl Glycol-Propyl Glycol (SYSTANE OP) Place 1 drop into both eyes 4 (four) times daily as needed (dry eyes).     potassium chloride  20 MEQ/15ML (10%) SOLN Take 15 mLs (20 mEq total) by mouth daily. 473 mL 3   pregabalin  (LYRICA ) 50 MG capsule Take 1 capsule (50 mg total) by mouth 2 (two) times daily. 180 capsule 1   allopurinol  (ZYLOPRIM ) 100 MG tablet Take 1 tablet (100 mg total) by mouth daily. (Patient not taking: Reported on 04/03/2024) 30 tablet 1   atorvastatin  (LIPITOR ) 80 MG tablet Take 1 tablet (80 mg total) by mouth daily. (Patient not taking: Reported on 04/03/2024) 90 tablet 1   budesonide -formoterol  (SYMBICORT ) 160-4.5 MCG/ACT inhaler Inhale 2 puffs into the lungs 2 (two) times daily. (Patient not taking: Reported on 04/03/2024) 1 each 6   colchicine  0.6 MG tablet Take 1 tablet (0.6 mg total) by mouth daily. (Patient not taking: Reported on 04/03/2024)     cyanocobalamin  (VITAMIN B12) 1000 MCG/ML injection Inject 1 mL (1,000 mcg total) into the muscle every 30 (thirty) days. Inject 1 ml (1000 mcg ) IM weekly x 4,  Then monthly thereafter (Patient not taking: Reported on 04/03/2024) 3 mL 1   dapagliflozin  propanediol (FARXIGA ) 10 MG TABS tablet Take 1 tablet (10 mg total) by mouth daily. (Patient not taking: Reported on 04/03/2024) 90 tablet 3    DULoxetine  (CYMBALTA ) 60 MG capsule Take 1 capsule (60 mg total) by mouth daily. (Patient not taking: Reported on 04/03/2024) 90 capsule 3   ezetimibe  (ZETIA ) 10 MG tablet Take 1 tablet (10 mg total) by mouth daily. (Patient not taking: Reported on 04/03/2024) 90 tablet 3   fexofenadine (ALLEGRA) 180 MG tablet Take 180 mg by mouth as needed for allergies or rhinitis. (Patient not taking: Reported on 04/03/2024)     lidocaine  (LIDODERM ) 5 % PLACE 1 PATCH ONTO THE SKIN DAILY. REMOVE & DISCARD PATCH WITHIN 12 HOURS OR AS DIRECTED BY MD (Patient not taking: Reported on 04/03/2024) 30 patch 0   nitroGLYCERIN  (NITROSTAT ) 0.4 MG SL tablet Place 1 tablet (0.4 mg total) under the tongue every 5 (five) minutes as needed for chest pain. (Patient not taking: Reported on 04/03/2024) 25 tablet 5   ondansetron  (ZOFRAN -ODT) 4 MG disintegrating tablet Take 1 tablet (4 mg total) by mouth every 8 (eight) hours as needed for nausea or vomiting. (Patient not taking: Reported on 04/03/2024) 30 tablet 0   tiZANidine  (ZANAFLEX ) 4 MG tablet Take 1 tablet (4 mg total) by mouth in the morning and at bedtime. (Patient not taking: Reported on 04/03/2024) 60 tablet 0   torsemide  (DEMADEX ) 20 MG tablet Take 1 tablet (20 mg total) by mouth daily. (Patient not taking: Reported on 04/03/2024) 90 tablet 3   No current facility-administered medications on file prior to visit.    There are no Patient Instructions on file for this visit. No follow-ups on file.   Gwendlyn JONELLE Shank, NP

## 2024-04-07 ENCOUNTER — Encounter: Payer: Self-pay | Admitting: Urology

## 2024-04-07 ENCOUNTER — Ambulatory Visit: Admitting: Urology

## 2024-04-07 VITALS — BP 100/74 | HR 101 | Ht 64.0 in | Wt 230.0 lb

## 2024-04-07 DIAGNOSIS — R300 Dysuria: Secondary | ICD-10-CM

## 2024-04-07 LAB — URINALYSIS, COMPLETE
Bilirubin, UA: NEGATIVE
Ketones, UA: NEGATIVE
Nitrite, UA: NEGATIVE
Specific Gravity, UA: 1.015 (ref 1.005–1.030)
Urobilinogen, Ur: 0.2 mg/dL (ref 0.2–1.0)
pH, UA: 6 (ref 5.0–7.5)

## 2024-04-07 LAB — MICROSCOPIC EXAMINATION: WBC, UA: >30 /HPF — AB (ref 0–5)

## 2024-04-07 MED ORDER — FLUCONAZOLE 100 MG PO TABS: 100.0000 mg | ORAL_TABLET | Freq: Every day | ORAL | 0 refills | Status: AC

## 2024-04-07 NOTE — Progress Notes (Signed)
 04/07/2024 2:29 PM   Denise Sharp 08-Apr-1946 [id number]  Referring provider: Marylynn Verneita CROME, MD [address] [place],  [place] [address]  Urological history: 1. High risk hematuria - non- smoker - several CT's w/o worrisome GU findings - cysto (2022) - diffusely erythematous mucus  2. Urge incontinence - InterStim (2015)  - InterStim replaced (2023)   3. Bilateral renal cysts - contrast CT (2024)  4. Left adrenal nodule - contrast CT (2024) - stable x several CT's  Chief Complaint  Patient presents with   PTNS    Urinary incontinence    HPI: Denise Sharp is a 78 y.o. woman who presents today for the PTNS treatment, but she did mention she had been on 3 days of IM Rocephin  through her facility for dysuria and lower back pain and her symptoms have not improved.  Previous records reviewed.  She states that she has been having intermittent dysuria off-and-on for the last several weeks, but over the last week it became more intense.  She states that she provide a urine specimen where she resides [facility] and she was told she had a UTI.  She was started on Rocephin  IM daily for 5 days.  She has not seen any improvement in her symptoms.  Patient denies any modifying or aggravating factors.  Patient denies any recent UTI's, gross hematuria  or suprapubic/flank pain.  Patient denies any fevers, chills, nausea or vomiting.    CATH UA 2+ glucose, trace protein, 2+ blood, 2+ leukocyte, greater than 30 WBC's,  RBCs, 3-10 RBCs, 0-2 epithelial cells, many bacteria and yeast present  PMH: Past Medical History:  Diagnosis Date   (HFpEF) heart failure with preserved ejection fraction (HCC)    a. 12/2019 Echo: EF 65-70%, Gr2 DD. No significant valvular dzs.   Abnormality of gait 03/25/2013   Adrenal mass, left    Anginal pain    Anxiety    Aortic atherosclerosis    Arthritis    Asthma    CAD (coronary artery disease) with h/o Atypical Chest Pain     a. 04/1986 Cath ([facility]): nl cors, EF 65%; b. 07/2012 Cath [facility]): Diff minor irregs; c. 07/2016 MV [facility]): Equivocal; d. 08/2016 Cardiac CT Ca2+ score [facility]): Ca2+ 1548; e. 05/2019 MV: No ischemia. EF 75%; f. 12/2019 PCI: LM nl, LAD 65ost (3.5x12 Resolute DES), 59m (2.75x12 Resolute DES),LCX/RCA nl; g. 08/2020 Cath: patent LAD stents, RCA 40ost, elev RH pressures, EF >65%.   Cervical spinal stenosis 1994   due to trauma to back ([employer] accident), has intermittent paralysis and parasthesias   Cervicogenic headache 03/23/2014   CKD (chronic kidney disease), stage III (HCC)    Depression    Diverticulosis    Dizziness    a.) chronic   DJD (degenerative joint disease)    a. Chronic R shoulder pain   Dyspnea    Esophageal stenosis 09/2009   a.) transient outlet obstruction by food, cleared by EGD   Family history of adverse reaction to anesthesia    a.) daughter with (+) PONV   Gastric bypass status for obesity    GERD (gastroesophageal reflux disease)    Gout    Headache(784.0)    HLD (hyperlipidemia)    Hypertension    IBS (irritable bowel syndrome)    IDA (iron  deficiency anemia)    a.) post 2 unit txfsn 2009, normal endo/colonoscopy by [facility]   Left bundle branch block (LBBB)    a.) Intermittently present -  likely rate related.   NSVT (nonsustained ventricular tachycardia) (HCC) 11/15/2020   a.) Holter study 11/15/2020; 11 episodes of NSVT lasting up to 8 beats with a maximum rate of 210 bpm; 14 atrial runs lasting up to 18 beats with a rate of up to 250 bpm.  Some atrial runs felt to be NSVT.   NSVT (nonsustained ventricular tachycardia) (HCC)    a. 10/2020 [facility]: 11 runs of NSVT up to 8 beats.   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 10/2020 [facility]: 14 atrial runs up to 18 beats, max HR 250.   Restless leg syndrome    Rotator cuff  arthropathy, right 08/13/2017   Syncope and collapse 03/12/2014   Type II diabetes mellitus (HCC)     Surgical History: Past Surgical History:  Procedure Laterality Date   AMPUTATION Left 03/07/2024   Procedure: AMPUTATION BELOW KNEE;  Surgeon: Tisa Curry LABOR, MD;  Location: [facility];  Service: Vascular;  Laterality: Left;   APPENDECTOMY     BACK SURGERY     CARDIOVASCULAR STRESS TEST     CARDIOVERSION N/A 10/30/2022   Procedure: CARDIOVERSION;  Surgeon: Mady Bruckner, MD;  Location: [facility];  Service: Cardiovascular;  Laterality: N/A;   CHOLECYSTECTOMY N/A 1976   COLONOSCOPY     CORONARY ANGIOPLASTY     CORONARY STENT INTERVENTION N/A 01/04/2020   Procedure: CORONARY STENT INTERVENTION;  Surgeon: Darron Deatrice LABOR, MD;  Location: [facility];  Service: Cardiovascular;  Laterality: N/A;  LAD    DIAPHRAGMATIC HERNIA REPAIR  2015   ESOPHAGEAL DILATION     multiple   ESOPHAGOGASTRODUODENOSCOPY (EGD) WITH PROPOFOL  N/A 05/11/2021   Procedure: ESOPHAGOGASTRODUODENOSCOPY (EGD) WITH PROPOFOL ;  Surgeon: Jinny Carmine, MD;  Location: [facility];  Service: Endoscopy;  Laterality: N/A;   ESOPHAGOGASTRODUODENOSCOPY (EGD) WITH PROPOFOL  N/A 06/13/2021   Procedure: ESOPHAGOGASTRODUODENOSCOPY (EGD) WITH PROPOFOL ;  Surgeon: Jinny Carmine, MD;  Location: [facility];  Service: Endoscopy;  Laterality: N/A;   ESOPHAGOGASTRODUODENOSCOPY (EGD) WITH PROPOFOL  N/A 02/28/2023   Procedure: ESOPHAGOGASTRODUODENOSCOPY (EGD) WITH PROPOFOL ;  Surgeon: Jinny Carmine, MD;  Location: [facility];  Service: Endoscopy;  Laterality: N/A;   EYE SURGERY     GASTRIC BYPASS  2000, 2005   Dr. Mia REDBIRD IMPLANT PLACEMENT  10/2011   Ike REDBIRD IMPLANT PLACEMENT N/A 09/18/2021   Procedure: REDBIRD IMPLANT FIRST STAGE;  Surgeon: Gaston Hamilton, MD;  Location: [facility];  Service: Urology;  Laterality: N/A;   INTERSTIM IMPLANT PLACEMENT N/A 09/18/2021   Procedure: REDBIRD IMPLANT  SECOND STAGE WITH IMPEDENCE CHECK;  Surgeon: Gaston Hamilton, MD;  Location: [facility];  Service: Urology;  Laterality: N/A;   INTERSTIM IMPLANT REMOVAL N/A 09/18/2021   Procedure: REMOVAL OF INTERSTIM IMPLANT;  Surgeon: Gaston Hamilton, MD;  Location: [facility];  Service: Urology;  Laterality: N/A;   LEFT HEART CATH AND CORONARY ANGIOGRAPHY N/A 04/1986   LEFT HEART CATH AND CORONARY ANGIOGRAPHY Left 07/24/2012   LEFT HEART CATH AND CORS/GRAFTS ANGIOGRAPHY N/A 12/31/2019   Procedure: LEFT HEART CATH AND CORS/GRAFTS ANGIOGRAPHY;  Surgeon: Perla Evalene PARAS, MD;  Location: [facility];  Service: Cardiovascular;  Laterality: N/A;   PANNICULECTOMY N/A 06/16/2019   Procedure: PANNICULECTOMY;  Surgeon: Elisabeth Craig RAMAN, MD;  Location: [facility];  Service: Plastics;  Laterality: N/A;  3 hours, please   REVERSE SHOULDER ARTHROPLASTY  Right 08/13/2017   Procedure: REVERSE RIGHT SHOULDER ARTHROPLASTY;  Surgeon: Josefina Chew, MD;  Location: [facility];  Service: Orthopedics;  Laterality: Right;   RIGHT/LEFT HEART CATH AND CORONARY ANGIOGRAPHY N/A 09/06/2020   Procedure: RIGHT/LEFT HEART CATH AND CORONARY ANGIOGRAPHY;  Surgeon: Mady Bruckner, MD;  Location: [facility];  Service: Cardiovascular;  Laterality: N/A;   RIGHT/LEFT HEART CATH AND CORONARY ANGIOGRAPHY N/A 06/04/2022   Procedure: RIGHT/LEFT HEART CATH AND CORONARY ANGIOGRAPHY;  Surgeon: Mady Bruckner, MD;  [facility];  Service: Cardiovascular;  Laterality: N/A;   ROTATOR CUFF REPAIR Right    SPINE SURGERY  1995   [place]   TOOTH EXTRACTION  11/13/2021   TOTAL ABDOMINAL HYSTERECTOMY W/ BILATERAL SALPINGOOPHORECTOMY  1974   TOTAL KNEE ARTHROPLASTY Bilateral 2007   Procedure: TOTAL KNEE ARTHROPLASTY; Surgeons: Kathi, MD and [name], MD   UMBILICAL HERNIA REPAIR  08/11/2015    Home Medications:  Allergies as of 04/07/2024       Reactions   Amoxicillin  Diarrhea   amoxicillin    Clarithromycin  Nausea And  Vomiting, Other (See Comments)   Severe irritable bowel clarithromycin    Demeclocycline Hives   Erythromycin Nausea And Vomiting, Other (See Comments)   Severe irritable bowel   Flagyl [metronidazole] Nausea And Vomiting, Other (See Comments)   Severe irritable bowel   Glucophage [metformin Hcl] Nausea And Vomiting, Other (See Comments)   Sick I won't take anything that has metformin in it   Tetracyclines & Related Hives, Rash   Potassium Chloride  Other (See Comments)   Cannot swallow potssium pills, able to tolerate liquid potassium   Tetracycline Other (See Comments)   Diovan [valsartan] Nausea Only       Sulfa  Antibiotics Other (See Comments), Rash   As child   Xanax [alprazolam] Other (See Comments)   Hyperactivity         Medication List        Accurate as of April 07, 2024  2:29 PM. If you have any questions, ask your nurse or doctor.          STOP taking these medications    allopurinol  100 MG tablet Commonly known as: ZYLOPRIM  Stopped by: CLOTILDA Xeng Kucher   atorvastatin  80 MG tablet Commonly known as: LIPITOR  Stopped by: Inmer Nix   budesonide -formoterol  160-4.5 MCG/ACT inhaler Commonly known as: SYMBICORT  Stopped by: Uriyah Raska   colchicine  0.6 MG tablet Stopped by: Eliyas Suddreth   cyanocobalamin  1000 MCG/ML injection Commonly known as: VITAMIN B12 Stopped by: Ryla Cauthon   dapagliflozin  propanediol 10 MG Tabs tablet Commonly known as: FARXIGA  Stopped by: Sofya Moustafa   DULoxetine  60 MG capsule Commonly known as: Cymbalta  Stopped by: Akeen Ledyard   fexofenadine 180 MG tablet Commonly known as: ALLEGRA Stopped by: Venera Privott   lidocaine  5 % Commonly known as: LIDODERM  Stopped by: Peggy Monk   nitroGLYCERIN  0.4 MG SL tablet Commonly known as: NITROSTAT  Stopped by: Demetrius Mahler   ondansetron  4 MG disintegrating tablet Commonly known as: ZOFRAN -ODT Stopped by: Renata Gambino   oxycodone  5  MG capsule Commonly known as: OXY-IR Stopped by: Merlean Pizzini   pantoprazole  40 MG tablet Commonly known as: PROTONIX  Stopped by: Bonna Steury   tiZANidine  4 MG tablet Commonly known as: ZANAFLEX  Stopped by: Adana Marik   torsemide  20 MG tablet Commonly known as: DEMADEX  Stopped by: Elek Holderness   Tresiba  FlexTouch 100 UNIT/ML FlexTouch Pen Generic drug: insulin  degludec Stopped by: CLOTILDA CORNWALL   Vitamin D3 250 MCG (10000 UT) capsule Stopped  by: Aragorn Recker       TAKE these medications    acetaminophen  325 MG tablet Commonly known as: TYLENOL  Take 2 tablets (650 mg total) by mouth every 6 (six) hours as needed for mild pain (pain score 1-3).   clotrimazole -betamethasone  cream Commonly known as: LOTRISONE  Apply 1 application topically 2 (two) times daily as needed (irritation).   dicyclomine  20 MG tablet Commonly known as: BENTYL  Take 1 tablet (20 mg total) by mouth 3 (three) times daily before meals.   Droplet Pen Needles 31G X 5 MM Misc Generic drug: Insulin  Pen Needle USE ONE NEEDLE SUBCUTANEOUSLY AS DIRECTED (REMOVE AND DISCARD NEEDLE IN SHARPS CONTAINER IMMEDIATELY AFTER USE)   ezetimibe  10 MG tablet Commonly known as: ZETIA  Take 1 tablet (10 mg total) by mouth daily.   fluconazole  100 MG tablet Commonly known as: DIFLUCAN  Take 1 tablet (100 mg total) by mouth daily. X 7 days Started by: CLOTILDA CORNWALL   fluticasone  50 MCG/ACT nasal spray Commonly known as: FLONASE  Place 2 sprays into both nostrils daily.   FreeStyle Libre 3 Plus Sensor Misc Change sensor every 15 days.   Gvoke HypoPen  2-Pack 0.5 MG/0.1ML Soaj Generic drug: Glucagon  Inject 1 auto injector subcutaneously as needed for severe hypoglycemia. May repeat x 1 after 15 minutes if needed.   Kerendia  10 MG Tabs Generic drug: Finerenone  Take 1 tablet (10 mg total) by mouth in the morning.   loperamide  2 MG capsule Commonly known as: IMODIUM  Take 4-6 mg by mouth 3  (three) times daily before meals.   NovoLOG  FlexPen 100 UNIT/ML FlexPen Generic drug: insulin  aspart Inject 8-14 Units into the skin 3 (three) times daily with meals.   nystatin  powder Commonly known as: MYCOSTATIN /NYSTOP  Apply 1 g topically 4 (four) times daily as needed (rash).   OneTouch Delica Lancets 33G Misc Used to check blood sugar two times a day.   OneTouch Verio test strip Generic drug: glucose blood Use to check blood sugar twice daily.   OVER THE COUNTER MEDICATION Apply 1 application  topically daily as needed (pain). [place] Gel topical pain reliever   potassium chloride  20 MEQ/15ML (10%) Soln Take 15 mLs (20 mEq total) by mouth daily.   pregabalin  50 MG capsule Commonly known as: LYRICA  Take 1 capsule (50 mg total) by mouth 2 (two) times daily.   Premarin  vaginal cream Generic drug: conjugated estrogens  Apply one pea-sized amount around the opening of the urethra daily for 2 weeks, then 3 times weekly moving forward.   SYSTANE OP Place 1 drop into both eyes 4 (four) times daily as needed (dry eyes).        Allergies:  Allergies  Allergen Reactions   Amoxicillin  Diarrhea    amoxicillin    Clarithromycin  Nausea And Vomiting and Other (See Comments)    Severe irritable bowel  clarithromycin    Demeclocycline Hives   Erythromycin Nausea And Vomiting and Other (See Comments)    Severe irritable bowel   Flagyl [Metronidazole] Nausea And Vomiting and Other (See Comments)    Severe irritable bowel   Glucophage [Metformin Hcl] Nausea And Vomiting and Other (See Comments)    Sick I won't take anything that has metformin in it   Tetracyclines & Related Hives and Rash   Potassium Chloride  Other (See Comments)    Cannot swallow potssium pills, able to tolerate liquid potassium   Tetracycline Other (See Comments)   Diovan [Valsartan] Nausea Only        Sulfa  Antibiotics Other (See Comments) and Rash  As child   Xanax [Alprazolam] Other (See Comments)     Hyperactivity     Family History: Family History  Problem Relation Age of Onset   Heart disease Father    Hypertension Father    Prostate cancer Father    Stroke Father    Osteoporosis Father    Stroke Mother    Depression Mother    Headache Mother    Heart disease Mother    Thyroid  disease Mother    Hypertension Mother    Diabetes Daughter    Heart disease Daughter    Hypertension Daughter    Hypertension Son     Social History:  reports that she has never smoked. She has never been exposed to tobacco smoke. She has never used smokeless tobacco. She reports that she does not currently use drugs. She reports that she does not drink alcohol .  ROS: Pertinent ROS in HPI  Physical Exam: BP 100/74 (BP Location: Right Arm, Patient Position: Sitting, Cuff Size: Large)   Pulse (!) 101   Ht 5' 4 (1.626 m)   Wt 230 lb (104.3 kg)   BMI 39.48 kg/m   Constitutional:  Well nourished. Alert and oriented, No acute distress. HEENT: [place] AT, moist mucus membranes.  Trachea midline, no masses. Cardiovascular: No clubbing, cyanosis, or edema. Respiratory: Normal respiratory effort, no increased work of breathing. GU: No CVA tenderness.  No bladder fullness or masses.  Recession of labia minora, dry, pale vulvar vaginal mucosa and loss of mucosal ridges and folds.  Normal urethral meatus, no lesions, no prolapse, no discharge.   No urethral masses, tenderness and/or tenderness. No bladder fullness, tenderness or masses. Pale vagina mucosa, poor estrogen effect, no discharge, no lesions.  Anus and perineum are without rashes or lesions.    Neurologic: Grossly intact, no focal deficits, moving all 4 extremities. Psychiatric: Normal mood and affect.    Laboratory Data: Lab Results  Component Value Date   WBC 11.5 (H) 03/15/2024   HGB 9.4 (L) 03/15/2024   HCT 29.6 (L) 03/15/2024   MCV 90.5 03/15/2024   PLT 455 (H) 03/15/2024   Lab Results  Component Value Date   CREATININE 1.36 (H)  03/15/2024   Lab Results  Component Value Date   HGBA1C 7.6 (A) 01/07/2024  I have reviewed the labs.  See HPI   Pertinent Imaging: N/A  In and Out Catheterization  Patient is present today for a I & O catheterization due to UTI symptoms not responding to antibiotics. Patient was cleaned and prepped in a sterile fashion with betadine . A 14 FR cath was inserted no complications were noted , 150 ml of urine return was noted, urine was yellow and purulent. A clean urine sample was collected for urinalysis and urine culture. Bladder was drained  And catheter was removed with out difficulty.    Performed by: CLOTILDA CORNWALL, PA-C and Humberta Magallon-Mariche, CMA and Nya E Bynum, CMA  and Beauford DELENA Browner, CMA    Assessment & Plan:    1. Dysuria (Primary) - Urinalysis, Complete- grossly infected - CULTURE, URINE COMPREHENSIVE - Patient to finish the IM Rocephin , she has 2 doses left - Start Diflucan  100 mg once daily for 7 days - We will contact her once urine culture results are available, if the organism is resistant to the Rocephin , we will prescribe culture appropriate antibiotics, if urine culture is negative we will pursue CT renal stone study for further evaluation of her symptoms - If she should develop  fevers, vomiting or gross hematuria, she should seek treatment immediately in the emergency department  Return for Follow up pending labs.  These notes generated with voice recognition software. I apologize for typographical errors.  CLOTILDA HELON RIGGERS  [facility] [address]  [address] [place], [place] [address] [phone]

## 2024-04-07 NOTE — Progress Notes (Signed)
 PTNS  Session # 8  Health & Social Factors: UTI Caffeine : less than 1  Alcohol : none Daytime voids #per day: 5 heavy soaked diaper  Night-time voids #per night: 3 Urgency: severe Incontinence Episodes #per day: 5 Ankle used: right Treatment Setting: 17 Feeling/ Response:Sensory  in toes and toe flex. Comments: Phantom feeling  Performed By: Myrtha Dante LATHER  Follow Up: Return to the clinic next week for treatment #9.

## 2024-04-08 DIAGNOSIS — N3281 Overactive bladder: Secondary | ICD-10-CM | POA: Diagnosis not present

## 2024-04-08 DIAGNOSIS — E785 Hyperlipidemia, unspecified: Secondary | ICD-10-CM | POA: Diagnosis not present

## 2024-04-08 DIAGNOSIS — N1832 Chronic kidney disease, stage 3b: Secondary | ICD-10-CM | POA: Diagnosis not present

## 2024-04-08 DIAGNOSIS — E1122 Type 2 diabetes mellitus with diabetic chronic kidney disease: Secondary | ICD-10-CM | POA: Diagnosis not present

## 2024-04-08 DIAGNOSIS — R809 Proteinuria, unspecified: Secondary | ICD-10-CM | POA: Diagnosis not present

## 2024-04-08 DIAGNOSIS — Z9884 Bariatric surgery status: Secondary | ICD-10-CM | POA: Diagnosis not present

## 2024-04-08 DIAGNOSIS — G4733 Obstructive sleep apnea (adult) (pediatric): Secondary | ICD-10-CM | POA: Diagnosis not present

## 2024-04-08 DIAGNOSIS — I10 Essential (primary) hypertension: Secondary | ICD-10-CM | POA: Diagnosis not present

## 2024-04-08 DIAGNOSIS — R3914 Feeling of incomplete bladder emptying: Secondary | ICD-10-CM | POA: Diagnosis not present

## 2024-04-08 DIAGNOSIS — R609 Edema, unspecified: Secondary | ICD-10-CM | POA: Diagnosis not present

## 2024-04-08 DIAGNOSIS — N3080 Other cystitis without hematuria: Secondary | ICD-10-CM | POA: Diagnosis not present

## 2024-04-08 DIAGNOSIS — I509 Heart failure, unspecified: Secondary | ICD-10-CM | POA: Diagnosis not present

## 2024-04-08 DIAGNOSIS — D649 Anemia, unspecified: Secondary | ICD-10-CM | POA: Diagnosis not present

## 2024-04-10 ENCOUNTER — Ambulatory Visit: Admitting: Internal Medicine

## 2024-04-11 LAB — CULTURE, URINE COMPREHENSIVE

## 2024-04-13 ENCOUNTER — Ambulatory Visit: Payer: Self-pay | Admitting: Urology

## 2024-04-14 ENCOUNTER — Ambulatory Visit: Admitting: Physician Assistant

## 2024-04-14 DIAGNOSIS — E119 Type 2 diabetes mellitus without complications: Secondary | ICD-10-CM | POA: Diagnosis not present

## 2024-04-14 DIAGNOSIS — L03116 Cellulitis of left lower limb: Secondary | ICD-10-CM | POA: Diagnosis not present

## 2024-04-14 DIAGNOSIS — E568 Deficiency of other vitamins: Secondary | ICD-10-CM | POA: Diagnosis not present

## 2024-04-14 DIAGNOSIS — R300 Dysuria: Secondary | ICD-10-CM | POA: Diagnosis not present

## 2024-04-14 DIAGNOSIS — R410 Disorientation, unspecified: Secondary | ICD-10-CM | POA: Diagnosis not present

## 2024-04-14 DIAGNOSIS — A5216 Charcot's arthropathy (tabetic): Secondary | ICD-10-CM | POA: Diagnosis not present

## 2024-04-16 DIAGNOSIS — N3080 Other cystitis without hematuria: Secondary | ICD-10-CM | POA: Diagnosis not present

## 2024-04-16 DIAGNOSIS — R441 Visual hallucinations: Secondary | ICD-10-CM | POA: Diagnosis not present

## 2024-04-16 DIAGNOSIS — R296 Repeated falls: Secondary | ICD-10-CM | POA: Diagnosis not present

## 2024-04-16 DIAGNOSIS — L03116 Cellulitis of left lower limb: Secondary | ICD-10-CM | POA: Diagnosis not present

## 2024-04-19 DIAGNOSIS — K5909 Other constipation: Secondary | ICD-10-CM | POA: Diagnosis not present

## 2024-04-19 DIAGNOSIS — R296 Repeated falls: Secondary | ICD-10-CM | POA: Diagnosis not present

## 2024-04-20 DIAGNOSIS — E119 Type 2 diabetes mellitus without complications: Secondary | ICD-10-CM | POA: Diagnosis not present

## 2024-04-20 DIAGNOSIS — N390 Urinary tract infection, site not specified: Secondary | ICD-10-CM | POA: Diagnosis not present

## 2024-04-20 DIAGNOSIS — L03116 Cellulitis of left lower limb: Secondary | ICD-10-CM | POA: Diagnosis not present

## 2024-04-21 ENCOUNTER — Other Ambulatory Visit: Payer: Self-pay | Admitting: Internal Medicine

## 2024-04-21 ENCOUNTER — Ambulatory Visit: Admitting: Physician Assistant

## 2024-04-21 VITALS — BP 90/61 | HR 68

## 2024-04-21 DIAGNOSIS — N390 Urinary tract infection, site not specified: Secondary | ICD-10-CM

## 2024-04-21 DIAGNOSIS — Z87442 Personal history of urinary calculi: Secondary | ICD-10-CM

## 2024-04-21 DIAGNOSIS — N3946 Mixed incontinence: Secondary | ICD-10-CM

## 2024-04-21 DIAGNOSIS — R109 Unspecified abdominal pain: Secondary | ICD-10-CM

## 2024-04-21 NOTE — Progress Notes (Unsigned)
 PTNS  Session # 9  Health & Social Factors: no change Caffeine : 3 Alcohol : 0 Daytime voids #per day: 5 Night-time voids #per night: 2-3 Urgency: severe Incontinence Episodes #per day: 4-5 Ankle used: right Treatment Setting: 5 Feeling/ Response: sensory Comments: Patient tolerated well. She reports malaise and alternating constipation and diarrhea since last week. She has had 6 courses of antibiotics in the last 2 months for UTIs with no improvement. She reports ongoing dysuria. 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 Will obtain CT stone study for structural abnormalities/obstruction.  Performed By: Denaja Verhoeven, PA-C   Follow Up: 1 week

## 2024-04-21 NOTE — Telephone Encounter (Unsigned)
 Copied from CRM #[id number]. Topic: Clinical - Medication Refill >> Apr 21, 2024  9:27 AM Denise Sharp PARAS wrote: Medication: Continuous Glucose Sensor (FREESTYLE LIBRE 3 PLUS SENSOR) MISC  Has the patient contacted their pharmacy? Yes She says she contacted the [facility] and they said it would take 3 weeks to get to her.   This is the patient's preferred pharmacy:  [id number] [place], [place] - [address] [place] [place] [address] Phone: [phone] Fax: [phone]  Is this the correct pharmacy for this prescription? Yes If no, delete pharmacy and type the correct one.   Has the prescription been filled recently? Yes,  Is the patient out of the medication? Yes he is out completely, she would like to get a sampe if possible.    Has the patient been seen for an appointment in the last year OR does the patient have an upcoming appointment? Yes  Can we respond through MyChart? No  Agent: Please be advised that Rx refills may take up to 3 business days. We ask that you follow-up with your pharmacy.

## 2024-04-22 DIAGNOSIS — R634 Abnormal weight loss: Secondary | ICD-10-CM | POA: Diagnosis not present

## 2024-04-22 DIAGNOSIS — E119 Type 2 diabetes mellitus without complications: Secondary | ICD-10-CM | POA: Diagnosis not present

## 2024-04-22 DIAGNOSIS — I959 Hypotension, unspecified: Secondary | ICD-10-CM | POA: Diagnosis not present

## 2024-04-22 MED ORDER — FREESTYLE LIBRE 3 PLUS SENSOR MISC: 3 refills | Status: DC

## 2024-04-23 DIAGNOSIS — I959 Hypotension, unspecified: Secondary | ICD-10-CM | POA: Diagnosis not present

## 2024-04-23 DIAGNOSIS — R410 Disorientation, unspecified: Secondary | ICD-10-CM | POA: Diagnosis not present

## 2024-04-23 DIAGNOSIS — E119 Type 2 diabetes mellitus without complications: Secondary | ICD-10-CM | POA: Diagnosis not present

## 2024-04-24 ENCOUNTER — Ambulatory Visit (INDEPENDENT_AMBULATORY_CARE_PROVIDER_SITE_OTHER): Admitting: Vascular Surgery

## 2024-04-26 DIAGNOSIS — N3080 Other cystitis without hematuria: Secondary | ICD-10-CM | POA: Diagnosis not present

## 2024-04-28 ENCOUNTER — Ambulatory Visit (INDEPENDENT_AMBULATORY_CARE_PROVIDER_SITE_OTHER): Admitting: Physician Assistant

## 2024-04-28 DIAGNOSIS — N3946 Mixed incontinence: Secondary | ICD-10-CM | POA: Diagnosis not present

## 2024-04-28 DIAGNOSIS — N1831 Chronic kidney disease, stage 3a: Secondary | ICD-10-CM | POA: Diagnosis not present

## 2024-04-28 DIAGNOSIS — E119 Type 2 diabetes mellitus without complications: Secondary | ICD-10-CM | POA: Diagnosis not present

## 2024-04-28 NOTE — Progress Notes (Signed)
 PTNS  Session # 10  Health & Social Factors: no change Caffeine : 3 Alcohol : 0 Daytime voids #per day: 5-6 Night-time voids #per night: 2 Urgency: severe Incontinence Episodes #per day: 5 Ankle used: right Treatment Setting: 17 Feeling/ Response: sensory Comments: Patient tolerated well.  Performed By: Samule Life, PA-C   Follow Up: 1 week

## 2024-04-29 NOTE — Progress Notes (Unsigned)
 No chief complaint on file.  ASSESSMENT AND PLAN  Denise Sharp is a 78 y.o. female   1.  Diabetic peripheral neuropathy 2.  Near syncope episode 3.  OSA, using CPAP 4.  Chronic neck pain, headache, light sensitivity 5.  Cervicogenic headache 6.  CHF  -Increase Cymbalta  60 mg daily for neck pain, headache, neuropathy (also on Lyrica  from other providers) -Headaches are consistent with cervicogenic, but potentially have migraine component, may consider CGRP in the future, has not been interested in Botox, also may consider referral to pain clinic consideration of cervical injection but have to be cautious with poorly controlled diabetes, has seen neurosurgery, told nothing surgical -Having PT, OT at home, may see if they can work with her neck, neuromuscular therapy -MRI cervical spine has shown mild canal stenosis at C5-6 with severe right and moderate left neuroforaminal narrowing  -MRI of brain and neck showed mild to moderate small vessel disease -Just prescribed tizanidine  as needed for leg cramps, have been severe since torsemide  -Follow-up with me in 1 year or sooner if needed   DIAGNOSTIC DATA (LABS, IMAGING, TESTING) - I reviewed patient records, labs, notes, testing and imaging myself where available. She does MEDICAL HISTORY:  [name], is a 78 year old female, accompanied by her husband Koren, seen in request by her [facility] care doctor Marylynn Verneita CROME for evaluation of dizziness episode, she was patient of Dr. Jenel in the past,  I reviewed and summarized the referring note. PMHX. HLD DM CAD s/p stent Urinary incontinence,  Gastric Bypass twice,  Bilateral Knee replacement    She has diabetic peripheral neuropathy, unsteady gait, chronic low back pain, limited activity, mostly sedentary, reported few episodes of lightheadedness, near passing out over the past few weeks  Few months ago, she was at [facility] fair, had breakfast, walk few isles, around  2 PM, while she was ready for her lunch, riding on her scooter, she suddenly felt lightheaded, wooziness sensation, difficulty focusing, her husband has to help her sliding down to the chair by the table, did not loss of consciousness, after eating only small amount of meal, she felt better, glucose level was 170 range,  She reported few episode of near passing out in the past, sometimes related to low glucose,  Personally reviewed MRI of the brain March 04, 2022, mild small vessel disease, no acute abnormality MRI of cervical spine, multilevel degenerative changes, no significant canal stenosis, variable degree of foraminal narrowing Nuclear medicine myocardial perfusion study, normal wall motion, normal ejection fraction, CT imaging showed calcification in the LAD, low risk scan  She did have a history of irregular heart rate in the past,  UPDATE Jun 11 2022: She is accompanied by her husband at today's clinical visit, complains of feeling miserable all the time, moderate daily headache, standing from occipital region, spreading forward, constant moderate, sometimes to the point of light noise sensitivity, has to close her eyes, resting for a while,  Also complains of hearing loss, seen by ENT, also evaluated for her dizziness, reported no significant vestibular dysfunction  She has progressive worsening diabetic peripheral neuropathy, numbness from knee down, gait abnormality,  Recent cardiac evaluation, cardiac catheter showed no significant coronary artery disease, patent stent,  Cardiac Cath on Jun 04 2022:  Mild-moderate, non-obstructive coronary artery disease, similar to prior catheterization in 08/2020. Widely patent ostial LAD stent. Patent mid LAD stent with minimal in stent restenosis (~10% narrowing). Upper normal left and right heart filling pressures. Borderline  elevated pulmonary artery pressure. Normal to mildly reduced cardiac output/index.  Update August 15, 2022 SS:  Is not interested in Botox for headaches. Has seen neurosurgery about the neck, nothing surgical, injections not discussed at the time her diabetes was under poor control. Headaches are occipital, feel better in a dark room. PCP gives Fioricet  as needed, sparingly, no more than 3 a week. Spends most of the day in bed, due to a variety of reasons of her poor health. More than 3 hours of activity makes her physically exhausted. Got iron  infusion. Has gained 30 lbs since November.   Update May 01, 2023 SS: Admitted Sept 2024 for fluid overload, she lost 30 lbs. Started on torsemide , is gaining fluid back. Going to CHF clinic today. Cymbalta  30 mg daily, has been helpful, continues to cervicogenic headache, gait instability. Headaches are occipital, 2-3 times a month bad headache these are better. Cramping in feet, toes, neuropathy getting worse, last A1C 7.6 Sept 2024, cramps severe since Torsemide . Tizanidine  was ordered for foot cramps, hasn't need to try it yet. Using walker since d/c, getting home health PT OT but hard to do twice weekly with doctor visits. Her husband has metastatic prostate cancer.   Update April 30, 2024 SS:   PHYSICAL EXAM:   There were no vitals filed for this visit.  PHYSICAL EXAMNIATION:  Gen: NAD, conversant, well nourised, well groomed                      NEUROLOGICAL EXAM:  MENTAL STATUS: Speech/cognition:  obese elderly female, alert, oriented to history taking and casual conversation CRANIAL NERVES: CN II: Visual fields are full to confrontation. Pupils are round equal and briskly reactive to light. CN III, IV, VI: extraocular movement are normal. No ptosis. CN V: Facial sensation is intact to light touch CN VII: Face is symmetric with normal eye closure  CN VIII: Hearing is normal to causal conversation. CN IX, X: Phonation is normal. CN XI: Head turning and shoulder shrug are intact  MOTOR: no significant muscle weakness  REFLEXES: Reflexes are 2+  and symmetric   Sensory: Length dependent decreased to light touch to knee level  COORDINATION: There is no trunk or limb dysmetria noted.  GAIT/STANCE: Push-up to get up from seated position, cautious, unsteady, uses walker in the hallway  REVIEW OF SYSTEMS:  Full 14 system review of systems performed and notable only for as above  See HPI  ALLERGIES: Allergies  Allergen Reactions   Amoxicillin  Diarrhea    amoxicillin    Clarithromycin  Nausea And Vomiting and Other (See Comments)    Severe irritable bowel  clarithromycin    Demeclocycline Hives   Erythromycin Nausea And Vomiting and Other (See Comments)    Severe irritable bowel   Flagyl [Metronidazole] Nausea And Vomiting and Other (See Comments)    Severe irritable bowel   Glucophage [Metformin Hcl] Nausea And Vomiting and Other (See Comments)    Sick I won't take anything that has metformin in it   Tetracyclines & Related Hives and Rash   Potassium Chloride  Other (See Comments)    Cannot swallow potssium pills, able to tolerate liquid potassium   Tetracycline Other (See Comments)   Diovan [Valsartan] Nausea Only        Sulfa  Antibiotics Other (See Comments) and Rash    As child   Xanax [Alprazolam] Other (See Comments)    Hyperactivity     HOME MEDICATIONS: Current Outpatient Medications  Medication Sig Dispense Refill  acetaminophen  (TYLENOL ) 325 MG tablet Take 2 tablets (650 mg total) by mouth every 6 (six) hours as needed for mild pain (pain score 1-3).     clotrimazole -betamethasone  (LOTRISONE ) cream Apply 1 application topically 2 (two) times daily as needed (irritation).     conjugated estrogens  (PREMARIN ) vaginal cream Apply one pea-sized amount around the opening of the urethra daily for 2 weeks, then 3 times weekly moving forward. 30 g 4   Continuous Glucose Sensor (FREESTYLE LIBRE 3 PLUS SENSOR) MISC Change sensor every 15 days. 6 each 3   dicyclomine  (BENTYL ) 20 MG tablet Take 1 tablet (20 mg total)  by mouth 3 (three) times daily before meals. 180 tablet 2   Finerenone  (KERENDIA ) 10 MG TABS Take 1 tablet (10 mg total) by mouth in the morning. 90 tablet 3   fluconazole  (DIFLUCAN ) 100 MG tablet Take 1 tablet (100 mg total) by mouth daily. X 7 days 7 tablet 0   fluticasone  (FLONASE ) 50 MCG/ACT nasal spray Place 2 sprays into both nostrils daily. 16 g 6   Glucagon  (GVOKE HYPOPEN  2-PACK) 0.5 MG/0.1ML SOAJ Inject 1 auto injector subcutaneously as needed for severe hypoglycemia. May repeat x 1 after 15 minutes if needed. 0.1 mL 2   glucose blood (ONETOUCH VERIO) test strip Use to check blood sugar twice daily. 100 each 1   insulin  aspart (NOVOLOG  FLEXPEN) 100 UNIT/ML FlexPen Inject 8-14 Units into the skin 3 (three) times daily with meals. 36 mL 3   Insulin  Pen Needle (DROPLET PEN NEEDLES) 31G X 5 MM MISC USE ONE NEEDLE SUBCUTANEOUSLY AS DIRECTED (REMOVE AND DISCARD NEEDLE IN SHARPS CONTAINER IMMEDIATELY AFTER USE) 100 each 1   loperamide  (IMODIUM ) 2 MG capsule Take 4-6 mg by mouth 3 (three) times daily before meals.     nystatin  (MYCOSTATIN /NYSTOP ) powder Apply 1 g topically 4 (four) times daily as needed (rash).     OneTouch Delica Lancets 33G MISC Used to check blood sugar two times a day. 100 each 4   OVER THE COUNTER MEDICATION Apply 1 application  topically daily as needed (pain). [place] Gel topical pain reliever     Polyethyl Glycol-Propyl Glycol (SYSTANE OP) Place 1 drop into both eyes 4 (four) times daily as needed (dry eyes).     potassium chloride  20 MEQ/15ML (10%) SOLN Take 15 mLs (20 mEq total) by mouth daily. 473 mL 3   pregabalin  (LYRICA ) 50 MG capsule Take 1 capsule (50 mg total) by mouth 2 (two) times daily. 180 capsule 1   No current facility-administered medications for this visit.    PAST MEDICAL HISTORY: Past Medical History:  Diagnosis Date   (HFpEF) heart failure with preserved ejection fraction (HCC)    a. 12/2019 Echo: EF 65-70%, Gr2 DD. No significant valvular dzs.    Abnormality of gait 03/25/2013   Adrenal mass, left    Anginal pain    Anxiety    Aortic atherosclerosis    Arthritis    Asthma    CAD (coronary artery disease) with h/o Atypical Chest Pain    a. 04/1986 Cath ([facility]): nl cors, EF 65%; b. 07/2012 Cath [facility]): Diff minor irregs; c. 07/2016 MV [facility]): Equivocal; d. 08/2016 Cardiac CT Ca2+ score [facility]): Ca2+ 1548; e. 05/2019 MV: No ischemia. EF 75%; f. 12/2019 PCI: LM nl, LAD 65ost (3.5x12 Resolute DES), 53m (2.75x12 Resolute DES),LCX/RCA nl; g. 08/2020 Cath: patent LAD stents, RCA 40ost, elev RH pressures, EF >65%.   Cervical spinal stenosis 1994   due to trauma to back (  [employer] accident), has intermittent paralysis and parasthesias   Cervicogenic headache 03/23/2014   CKD (chronic kidney disease), stage III (HCC)    Depression    Diverticulosis    Dizziness    a.) chronic   DJD (degenerative joint disease)    a. Chronic R shoulder pain   Dyspnea    Esophageal stenosis 09/2009   a.) transient outlet obstruction by food, cleared by EGD   Family history of adverse reaction to anesthesia    a.) daughter with (+) PONV   Gastric bypass status for obesity    GERD (gastroesophageal reflux disease)    Gout    Headache(784.0)    HLD (hyperlipidemia)    Hypertension    IBS (irritable bowel syndrome)    IDA (iron  deficiency anemia)    a.) post 2 unit txfsn 2009, normal endo/colonoscopy by Wohl   Left bundle branch block (LBBB)    a.) Intermittently present - likely rate related.   NSVT (nonsustained ventricular tachycardia) (HCC) 11/15/2020   a.) Holter study 11/15/2020; 11 episodes of NSVT lasting up to 8 beats with a maximum rate of 210 bpm; 14 atrial runs lasting up to 18 beats with a rate of up to 250 bpm.  Some atrial runs felt to be NSVT.   NSVT (nonsustained ventricular tachycardia) (HCC)    a. 10/2020 [facility]: 11 runs of NSVT up to 8 beats.   Obesity    OSA on CPAP    Polyneuropathy in diabetes(357.2) 03/25/2013   Postherpetic neuralgia  03/09/2021   Right V1 distribution   PSVT (paroxysmal supraventricular tachycardia)    a. 10/2020 [facility]: 14 atrial runs up to 18 beats, max HR 250.   Restless leg syndrome    Rotator cuff arthropathy, right 08/13/2017   Syncope and collapse 03/12/2014   Type II diabetes mellitus (HCC)     PAST SURGICAL HISTORY: Past Surgical History:  Procedure Laterality Date   AMPUTATION Left 03/07/2024   Procedure: AMPUTATION BELOW KNEE;  Surgeon: Tisa Curry LABOR, MD;  Location: [facility];  Service: Vascular;  Laterality: Left;   APPENDECTOMY     BACK SURGERY     CARDIOVASCULAR STRESS TEST     CARDIOVERSION N/A 10/30/2022   Procedure: CARDIOVERSION;  Surgeon: Mady Bruckner, MD;  Location: [facility];  Service: Cardiovascular;  Laterality: N/A;   CHOLECYSTECTOMY N/A 1976   COLONOSCOPY     CORONARY ANGIOPLASTY     CORONARY STENT INTERVENTION N/A 01/04/2020   Procedure: CORONARY STENT INTERVENTION;  Surgeon: Darron Deatrice LABOR, MD;  Location: [facility];  Service: Cardiovascular;  Laterality: N/A;  LAD    DIAPHRAGMATIC HERNIA REPAIR  2015   ESOPHAGEAL DILATION     multiple   ESOPHAGOGASTRODUODENOSCOPY (EGD) WITH PROPOFOL  N/A 05/11/2021   Procedure: ESOPHAGOGASTRODUODENOSCOPY (EGD) WITH PROPOFOL ;  Surgeon: Jinny Carmine, MD;  Location: [facility];  Service: Endoscopy;  Laterality: N/A;   ESOPHAGOGASTRODUODENOSCOPY (EGD) WITH PROPOFOL  N/A 06/13/2021   Procedure: ESOPHAGOGASTRODUODENOSCOPY (EGD) WITH PROPOFOL ;  Surgeon: Jinny Carmine, MD;  Location: [facility];  Service: Endoscopy;  Laterality: N/A;   ESOPHAGOGASTRODUODENOSCOPY (EGD) WITH PROPOFOL  N/A 02/28/2023   Procedure: ESOPHAGOGASTRODUODENOSCOPY (EGD) WITH PROPOFOL ;  Surgeon: Jinny Carmine, MD;  Location: [facility];  Service: Endoscopy;  Laterality: N/A;   EYE SURGERY     GASTRIC BYPASS  2000, 2005   Dr. Mia REDBIRD IMPLANT PLACEMENT  10/2011   Ike REDBIRD IMPLANT PLACEMENT N/A 09/18/2021   Procedure: REDBIRD  IMPLANT FIRST STAGE;  Surgeon: Gaston Hamilton, MD;  Location: [facility];  Service: Urology;  Laterality: N/A;   INTERSTIM IMPLANT PLACEMENT N/A 09/18/2021   Procedure: RENNA IMPLANT SECOND STAGE WITH IMPEDENCE CHECK;  Surgeon: Gaston Hamilton, MD;  Location: [facility];  Service: Urology;  Laterality: N/A;   INTERSTIM IMPLANT REMOVAL N/A 09/18/2021   Procedure: REMOVAL OF INTERSTIM IMPLANT;  Surgeon: Gaston Hamilton, MD;  Location: [facility];  Service: Urology;  Laterality: N/A;   LEFT HEART CATH AND CORONARY ANGIOGRAPHY N/A 04/1986   LEFT HEART CATH AND CORONARY ANGIOGRAPHY Left 07/24/2012   LEFT HEART CATH AND CORS/GRAFTS ANGIOGRAPHY N/A 12/31/2019   Procedure: LEFT HEART CATH AND CORS/GRAFTS ANGIOGRAPHY;  Surgeon: Perla Evalene PARAS, MD;  Location: [facility];  Service: Cardiovascular;  Laterality: N/A;   PANNICULECTOMY N/A 06/16/2019   Procedure: PANNICULECTOMY;  Surgeon: Elisabeth Craig RAMAN, MD;  Location: [facility];  Service: Plastics;  Laterality: N/A;  3 hours, please   REVERSE SHOULDER ARTHROPLASTY Right 08/13/2017   Procedure: REVERSE RIGHT SHOULDER ARTHROPLASTY;  Surgeon: Josefina Chew, MD;  Location: [facility];  Service: Orthopedics;  Laterality: Right;   RIGHT/LEFT HEART CATH AND CORONARY ANGIOGRAPHY N/A 09/06/2020   Procedure: RIGHT/LEFT HEART CATH AND CORONARY ANGIOGRAPHY;  Surgeon: Mady Bruckner, MD;  Location: [facility];  Service: Cardiovascular;  Laterality: N/A;   RIGHT/LEFT HEART CATH AND CORONARY ANGIOGRAPHY N/A 06/04/2022   Procedure: RIGHT/LEFT HEART CATH AND CORONARY ANGIOGRAPHY;  Surgeon: Mady Bruckner, MD;  [facility]: [facility];  Service: Cardiovascular;  Laterality: N/A;   ROTATOR CUFF REPAIR Right    SPINE SURGERY  1995   [place]   TOOTH EXTRACTION  11/13/2021   TOTAL ABDOMINAL HYSTERECTOMY W/ BILATERAL SALPINGOOPHORECTOMY  1974   TOTAL KNEE ARTHROPLASTY Bilateral 2007   Procedure: TOTAL KNEE ARTHROPLASTY; Surgeons: Kathi, MD and  [name], MD   UMBILICAL HERNIA REPAIR  08/11/2015    FAMILY HISTORY: Family History  Problem Relation Age of Onset   Heart disease Father    Hypertension Father    Prostate cancer Father    Stroke Father    Osteoporosis Father    Stroke Mother    Depression Mother    Headache Mother    Heart disease Mother    Thyroid  disease Mother    Hypertension Mother    Diabetes Daughter    Heart disease Daughter    Hypertension Daughter    Hypertension Son     SOCIAL HISTORY: Social History   Socioeconomic History   Marital status: Married    Spouse name: Koren    Number of children: 2   Years of education: College    Highest education level: Not on file  Occupational History    Employer: retired  Tobacco Use   Smoking status: Never    Passive exposure: Never   Smokeless tobacco: Never  Vaping Use   Vaping status: Never Used  Substance and Sexual Activity   Alcohol  use: No   Drug use: Not Currently    Comment: prescribed valium    Sexual activity: Not Currently    Birth control/protection: Post-menopausal, Surgical  Other Topics Concern   Not on file  Social History Narrative   Patient lives at home with husband Koren.    Right Handed   Drinks caffeinated tea occasionally   One pet in house, dog   Social Drivers of Health   Financial Resource Strain: Low Risk  (09/15/2021)   Overall Financial Resource Strain (CARDIA)    Difficulty of Paying Living Expenses: Not hard at all  Food  Insecurity: No Food Insecurity (03/04/2024)   Hunger Vital Sign    Worried About Running Out of Food in the Last Year: Never true    Ran Out of Food in the Last Year: Never true  Transportation Needs: No Transportation Needs (03/04/2024)   PRAPARE - Administrator, Civil Service (Medical): No    Lack of Transportation (Non-Medical): No  Physical Activity: Insufficiently Active (11/15/2020)   Exercise Vital Sign    Days of Exercise per Week: 2 days    Minutes of Exercise per Session:  40 min  Stress: Stress Concern Present (08/03/2020)   [employer] of Occupational Health - Occupational Stress Questionnaire    Feeling of Stress : Rather much  Social Connections: Unknown (03/04/2024)   Social Connection and Isolation Panel    Frequency of Communication with Friends and Family: Patient declined    Frequency of Social Gatherings with Friends and Family: Patient declined    Attends Religious Services: Never    Database administrator or Organizations: Patient declined    Attends Banker Meetings: Patient declined    Marital Status: Patient declined  Intimate Partner Violence: Not At Risk (03/04/2024)   Humiliation, Afraid, Rape, and Kick questionnaire    Fear of Current or Ex-Partner: No    Emotionally Abused: No    Physically Abused: No    Sexually Abused: No    Lauraine Born, SCHARLENE, DNP  [facility] [address] [place], [place] [address] [phone]

## 2024-04-30 ENCOUNTER — Ambulatory Visit: Payer: Medicare Other | Admitting: Neurology

## 2024-04-30 ENCOUNTER — Encounter: Payer: Self-pay | Admitting: Neurology

## 2024-04-30 VITALS — BP 104/69 | HR 83

## 2024-04-30 DIAGNOSIS — R519 Headache, unspecified: Secondary | ICD-10-CM | POA: Diagnosis not present

## 2024-04-30 DIAGNOSIS — E1142 Type 2 diabetes mellitus with diabetic polyneuropathy: Secondary | ICD-10-CM

## 2024-04-30 DIAGNOSIS — G8929 Other chronic pain: Secondary | ICD-10-CM

## 2024-04-30 DIAGNOSIS — Z794 Long term (current) use of insulin: Secondary | ICD-10-CM

## 2024-04-30 DIAGNOSIS — F419 Anxiety disorder, unspecified: Secondary | ICD-10-CM

## 2024-04-30 NOTE — Patient Instructions (Signed)
 Please follow-up with urology next week.  If symptoms continue after treatment of UTI would recommend MRI of the brain, EEG.  Also consider restarting Cymbalta .  Please keep me posted.

## 2024-05-01 DIAGNOSIS — N3080 Other cystitis without hematuria: Secondary | ICD-10-CM | POA: Diagnosis not present

## 2024-05-01 DIAGNOSIS — K648 Other hemorrhoids: Secondary | ICD-10-CM | POA: Diagnosis not present

## 2024-05-01 DIAGNOSIS — E039 Hypothyroidism, unspecified: Secondary | ICD-10-CM | POA: Diagnosis not present

## 2024-05-01 DIAGNOSIS — D519 Vitamin B12 deficiency anemia, unspecified: Secondary | ICD-10-CM | POA: Diagnosis not present

## 2024-05-04 ENCOUNTER — Ambulatory Visit: Admission: RE | Admit: 2024-05-04 | Source: Ambulatory Visit

## 2024-05-05 ENCOUNTER — Ambulatory Visit (INDEPENDENT_AMBULATORY_CARE_PROVIDER_SITE_OTHER): Admitting: Physician Assistant

## 2024-05-05 VITALS — BP 104/72 | HR 90

## 2024-05-05 DIAGNOSIS — N3946 Mixed incontinence: Secondary | ICD-10-CM

## 2024-05-05 NOTE — Progress Notes (Signed)
 PTNS  Session # 9  Health & Social Factors: no change Caffeine : 0 Alcohol : 0 Daytime voids #per day: 6-7 Night-time voids #per night: 2 Urgency: severe Incontinence Episodes #per day: all Ankle used: right Treatment Setting: 4 Feeling/ Response: sensory Comments: Patient tolerated well. She is having ongoing, intense dysuria. CT scan was scheduled for yesterday, but she was not taken to the appointment.  Performed By: Makhya Arave, PA-C   Follow Up: 1 week

## 2024-05-06 DIAGNOSIS — J309 Allergic rhinitis, unspecified: Secondary | ICD-10-CM | POA: Diagnosis not present

## 2024-05-08 ENCOUNTER — Ambulatory Visit
Admission: RE | Admit: 2024-05-08 | Discharge: 2024-05-08 | Disposition: A | Discharge status: Home / Self Care | Source: Ambulatory Visit | Attending: Physician Assistant | Admitting: Physician Assistant

## 2024-05-08 DIAGNOSIS — Z87442 Personal history of urinary calculi: Secondary | ICD-10-CM | POA: Diagnosis not present

## 2024-05-08 DIAGNOSIS — R109 Unspecified abdominal pain: Secondary | ICD-10-CM | POA: Insufficient documentation

## 2024-05-08 DIAGNOSIS — N390 Urinary tract infection, site not specified: Secondary | ICD-10-CM | POA: Diagnosis not present

## 2024-05-11 ENCOUNTER — Ambulatory Visit: Admitting: Nurse Practitioner

## 2024-05-11 ENCOUNTER — Encounter: Payer: Self-pay | Admitting: Nurse Practitioner

## 2024-05-11 VITALS — BP 98/64 | HR 83 | Ht 64.0 in | Wt 211.6 lb

## 2024-05-11 DIAGNOSIS — Z794 Long term (current) use of insulin: Secondary | ICD-10-CM

## 2024-05-11 DIAGNOSIS — I10 Essential (primary) hypertension: Secondary | ICD-10-CM | POA: Diagnosis not present

## 2024-05-11 DIAGNOSIS — E559 Vitamin D deficiency, unspecified: Secondary | ICD-10-CM | POA: Diagnosis not present

## 2024-05-11 DIAGNOSIS — E1165 Type 2 diabetes mellitus with hyperglycemia: Secondary | ICD-10-CM

## 2024-05-11 DIAGNOSIS — L03116 Cellulitis of left lower limb: Secondary | ICD-10-CM | POA: Diagnosis not present

## 2024-05-11 DIAGNOSIS — E782 Mixed hyperlipidemia: Secondary | ICD-10-CM

## 2024-05-11 DIAGNOSIS — H6504 Acute serous otitis media, recurrent, right ear: Secondary | ICD-10-CM | POA: Diagnosis not present

## 2024-05-11 LAB — HEMOGLOBIN A1C: Hemoglobin A1C: 7.3

## 2024-05-11 MED ORDER — INSULIN DEGLUDEC 100 UNIT/ML ~~LOC~~ SOPN: 15.0000 [IU] | PEN_INJECTOR | Freq: Every day | SUBCUTANEOUS | 1 refills | Status: DC

## 2024-05-11 MED ORDER — SITAGLIPTIN PHOSPHATE 50 MG PO TABS: 50.0000 mg | ORAL_TABLET | Freq: Every day | ORAL | 1 refills | Status: DC

## 2024-05-11 MED ORDER — NOVOLOG FLEXPEN 100 UNIT/ML ~~LOC~~ SOPN: 8.0000 [IU] | PEN_INJECTOR | Freq: Three times a day (TID) | SUBCUTANEOUS | 3 refills | Status: DC

## 2024-05-11 NOTE — Progress Notes (Signed)
 Endocrinology Follow Up Note       05/11/2024, 4:24 PM   Subjective:    Patient ID: Denise Sharp, female    DOB: 11-30-1945.  Denise Sharp is being seen in follow up after being seen in consultation for management of currently uncontrolled symptomatic diabetes requested by  Marylynn Verneita CROME, MD.   Past Medical History:  Diagnosis Date   (HFpEF) heart failure with preserved ejection fraction (HCC)    a. 12/2019 Echo: EF 65-70%, Gr2 DD. No significant valvular dzs.   Abnormality of gait 03/25/2013   Adrenal mass, left    Anginal pain    Anxiety    Aortic atherosclerosis    Arthritis    Asthma    CAD (coronary artery disease) with h/o Atypical Chest Pain    a. 04/1986 Cath ([facility]): nl cors, EF 65%; b. 07/2012 Cath [facility]): Diff minor irregs; c. 07/2016 MV [facility]): Equivocal; d. 08/2016 Cardiac CT Ca2+ score [facility]): Ca2+ 1548; e. 05/2019 MV: No ischemia. EF 75%; f. 12/2019 PCI: LM nl, LAD 65ost (3.5x12 Resolute DES), 28m (2.75x12 Resolute DES),LCX/RCA nl; g. 08/2020 Cath: patent LAD stents, RCA 40ost, elev RH pressures, EF >65%.   Cervical spinal stenosis 1994   due to trauma to back ([employer] accident), has intermittent paralysis and parasthesias   Cervicogenic headache 03/23/2014   CKD (chronic kidney disease), stage III (HCC)    Depression    Diverticulosis    Dizziness    a.) chronic   DJD (degenerative joint disease)    a. Chronic R shoulder pain   Dyspnea    Esophageal stenosis 09/2009   a.) transient outlet obstruction by food, cleared by EGD   Family history of adverse reaction to anesthesia    a.) daughter with (+) PONV   Gastric bypass status for obesity    GERD (gastroesophageal reflux disease)    Gout    Headache(784.0)    HLD (hyperlipidemia)    Hypertension    IBS (irritable bowel syndrome)    IDA (iron  deficiency anemia)    a.) post 2 unit txfsn 2009, normal endo/colonoscopy by [facility]    Left bundle branch block (LBBB)    a.) Intermittently present - likely rate related.   NSVT (nonsustained ventricular tachycardia) (HCC) 11/15/2020   a.) Holter study 11/15/2020; 11 episodes of NSVT lasting up to 8 beats with a maximum rate of 210 bpm; 14 atrial runs lasting up to 18 beats with a rate of up to 250 bpm.  Some atrial runs felt to be NSVT.   NSVT (nonsustained ventricular tachycardia) (HCC)    a. 10/2020 [facility]: 11 runs of NSVT up to 8 beats.   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 10/2020 [facility]: 14 atrial runs up to 18 beats, max HR 250.   Restless leg syndrome    Rotator cuff arthropathy, right 08/13/2017   Syncope and collapse 03/12/2014   Type II diabetes mellitus [facility])     Past Surgical History:  Procedure Laterality Date   AMPUTATION Left 03/07/2024  Procedure: AMPUTATION BELOW KNEE;  Surgeon: Tisa Curry LABOR, MD;  Location: [facility] ORS;  Service: Vascular;  Laterality: Left;   APPENDECTOMY     BACK SURGERY     CARDIOVASCULAR STRESS TEST     CARDIOVERSION N/A 10/30/2022   Procedure: CARDIOVERSION;  Surgeon: Mady Bruckner, MD;  Location: [facility] ORS;  Service: Cardiovascular;  Laterality: N/A;   CHOLECYSTECTOMY N/A 1976   COLONOSCOPY     CORONARY ANGIOPLASTY     CORONARY STENT INTERVENTION N/A 01/04/2020   Procedure: CORONARY STENT INTERVENTION;  Surgeon: Darron Deatrice LABOR, MD;  Location: [facility] INVASIVE CV LAB;  Service: Cardiovascular;  Laterality: N/A;  LAD    DIAPHRAGMATIC HERNIA REPAIR  2015   ESOPHAGEAL DILATION     multiple   ESOPHAGOGASTRODUODENOSCOPY (EGD) WITH PROPOFOL  N/A 05/11/2021   Procedure: ESOPHAGOGASTRODUODENOSCOPY (EGD) WITH PROPOFOL ;  Surgeon: Jinny Carmine, MD;  Location: [facility] ENDOSCOPY;  Service: Endoscopy;  Laterality: N/A;   ESOPHAGOGASTRODUODENOSCOPY (EGD) WITH PROPOFOL  N/A 06/13/2021   Procedure:  ESOPHAGOGASTRODUODENOSCOPY (EGD) WITH PROPOFOL ;  Surgeon: Jinny Carmine, MD;  Location: [facility] ENDOS[facility];  Service: Endoscopy;  Laterality: N/A;   ESOPHAGOGASTRODUODENOSCOPY (EGD) WITH PROPOFOL  N/A 02/28/2023   Procedure: ESOPHAGOGASTRODUODENOSCOPY (EGD) WITH PROPOFOL ;  Surgeon: Jinny Carmine, MD;  Location: [facility] ENDOS[facility];  Service: Endoscopy;  Laterality: N/A;   EYE SURGERY     GASTRIC BYPASS  2000, 2005   Dr. Mia REDBIRD IMPLANT PLACEMENT  10/2011   Ike REDBIRD IMPLANT PLACEMENT N/A 09/18/2021   Procedure: REDBIRD IMPLANT FIRST STAGE;  Surgeon: Gaston Hamilton, MD;  Location: [facility] OR[facility];  Service: Urology;  Laterality: N/A;   INTERSTIM IMPLANT PLACEMENT N/A 09/18/2021   Procedure: REDBIRD IMPLANT SECOND STAGE WITH IMPEDENCE CHECK;  Surgeon: Gaston Hamilton, MD;  Location: [facility];  Service: Urology;  Laterality: N/A;   INTERSTIM IMPLANT REMOVAL N/A 09/18/2021   Procedure: REMOVAL OF INTERSTIM IMPLANT;  Surgeon: Gaston Hamilton, MD;  Location: [facility];  Service: Urology;  Laterality: N/A;   LEFT HEART CATH AND CORONARY ANGIOGRAPHY N/A 04/1986   LEFT HEART CATH AND CORONARY ANGIOGRAPHY Left 07/24/2012   LEFT HEART CATH AND CORS/GRAFTS ANGIOGRAPHY N/A 12/31/2019   Procedure: LEFT HEART CATH AND CORS/GRAFTS ANGIOGRAPHY;  Surgeon: Perla Evalene PARAS, MD;  Location: [facility];  Service: Cardiovascular;  Laterality: N/A;   PANNICULECTOMY N/A 06/16/2019   Procedure: PANNICULECTOMY;  Surgeon: Elisabeth Craig RAMAN, MD;  Location: [facility];  Service: Plastics;  Laterality: N/A;  3 hours, please   REVERSE SHOULDER ARTHROPLASTY Right 08/13/2017   Procedure: REVERSE RIGHT SHOULDER ARTHROPLASTY;  Surgeon: Josefina Chew, MD;  Location: [facility] OR;  Service: Orthopedics;  Laterality: Right;   RIGHT/LEFT HEART CATH AND CORONARY ANGIOGRAPHY N/A 09/06/2020   Procedure: RIGHT/LEFT HEART CATH AND CORONARY ANGIOGRAPHY;  Surgeon: Mady Bruckner, MD;  Location: [facility];  Service:  Cardiovascular;  Laterality: N/A;   RIGHT/LEFT HEART CATH AND CORONARY ANGIOGRAPHY N/A 06/04/2022   Procedure: RIGHT/LEFT HEART CATH AND CORONARY ANGIOGRAPHY;  Surgeon: Mady Bruckner, MD;  Location: [facility];  Service: Cardiovascular;  Laterality: N/A;   ROTATOR CUFF REPAIR Right    SPINE SURGERY  1995   [place]   TOOTH EXTRACTION  11/13/2021   TOTAL ABDOMINAL HYSTERECTOMY W/ BILATERAL SALPINGOOPHORECTOMY  1974   TOTAL KNEE ARTHROPLASTY Bilateral 2007   Procedure: TOTAL KNEE ARTHROPLASTY; Surgeons: Kathi, MD and [name], MD   UMBILICAL HERNIA REPAIR  08/11/2015    Social History   Socioeconomic History   Marital status: Married    Spouse  name: Koren    Number of children: 2   Years of education: College    Highest education level: Not on file  Occupational History    Employer: retired  Tobacco Use   Smoking status: Never    Passive exposure: Never   Smokeless tobacco: Never  Vaping Use   Vaping status: Never Used  Substance and Sexual Activity   Alcohol  use: No   Drug use: Not Currently    Comment: prescribed valium    Sexual activity: Not Currently    Birth control/protection: Post-menopausal, Surgical  Other Topics Concern   Not on file  Social History Narrative   Patient lives at home with husband Koren.    Right Handed   Drinks caffeinated tea occasionally   One pet in house, dog   Social Drivers of Health   Financial Resource Strain: Low Risk  (09/15/2021)   Overall Financial Resource Strain (CARDIA)    Difficulty of Paying Living Expenses: Not hard at all  Food Insecurity: No Food Insecurity (03/04/2024)   Hunger Vital Sign    Worried About Running Out of Food in the Last Year: Never true    Ran Out of Food in the Last Year: Never true  Transportation Needs: No Transportation Needs (03/04/2024)   PRAPARE - Administrator, Civil Service (Medical): No    Lack of Transportation (Non-Medical): No  Physical Activity: Insufficiently Active  (11/15/2020)   Exercise Vital Sign    Days of Exercise per Week: 2 days    Minutes of Exercise per Session: 40 min  Stress: Stress Concern Present (08/03/2020)   [employer] of Occupational Health - Occupational Stress Questionnaire    Feeling of Stress : Rather much  Social Connections: Unknown (03/04/2024)   Social Connection and Isolation Panel    Frequency of Communication with Friends and Family: Patient declined    Frequency of Social Gatherings with Friends and Family: Patient declined    Attends Religious Services: Never    Database Administrator or Organizations: Patient declined    Attends Engineer, Structural: Patient declined    Marital Status: Patient declined    Family History  Problem Relation Age of Onset   Heart disease Father    Hypertension Father    Prostate cancer Father    Stroke Father    Osteoporosis Father    Stroke Mother    Depression Mother    Headache Mother    Heart disease Mother    Thyroid  disease Mother    Hypertension Mother    Diabetes Daughter    Heart disease Daughter    Hypertension Daughter    Hypertension Son     Outpatient Encounter Medications as of 05/11/2024  Medication Sig   acetaminophen  (TYLENOL ) 325 MG tablet Take 2 tablets (650 mg total) by mouth every 6 (six) hours as needed for mild pain (pain score 1-3).   clotrimazole -betamethasone  (LOTRISONE ) cream Apply 1 application topically 2 (two) times daily as needed (irritation).   conjugated estrogens  (PREMARIN ) vaginal cream Apply one pea-sized amount around the opening of the urethra daily for 2 weeks, then 3 times weekly moving forward.   Continuous Glucose Sensor (FREESTYLE LIBRE 3 PLUS SENSOR) MISC Change sensor every 15 days.   dicyclomine  (BENTYL ) 20 MG tablet Take 1 tablet (20 mg total) by mouth 3 (three) times daily before meals.   Finerenone  (KERENDIA ) 10 MG TABS Take 1 tablet (10 mg total) by mouth in the morning.   fluticasone  (FLONASE ) 50  MCG/ACT nasal  spray Place 2 sprays into both nostrils daily.   Glucagon  (GVOKE HYPOPEN  2-PACK) 0.5 MG/0.1ML SOAJ Inject 1 auto injector subcutaneously as needed for severe hypoglycemia. May repeat x 1 after 15 minutes if needed.   glucose blood (ONETOUCH VERIO) test strip Use to check blood sugar twice daily.   Insulin  Pen Needle (DROPLET PEN NEEDLES) 31G X 5 MM MISC USE ONE NEEDLE SUBCUTANEOUSLY AS DIRECTED (REMOVE AND DISCARD NEEDLE IN SHARPS CONTAINER IMMEDIATELY AFTER USE)   loperamide  (IMODIUM ) 2 MG capsule Take 4-6 mg by mouth 3 (three) times daily before meals.   midodrine (PROAMATINE) 5 MG tablet Take 5 mg by mouth as needed.   nystatin  (MYCOSTATIN /NYSTOP ) powder Apply 1 g topically 4 (four) times daily as needed (rash).   OneTouch Delica Lancets 33G MISC Used to check blood sugar two times a day.   oxycodone  (OXY-IR) 5 MG capsule Take 5 mg by mouth every 4 (four) hours as needed. 2.5 mg as needed every 6 hours (Patient taking differently: Take 2.5 mg by mouth every 4 (four) hours as needed. 2.5 mg as needed every 6 hours)   Polyethyl Glycol-Propyl Glycol (SYSTANE OP) Place 1 drop into both eyes 4 (four) times daily as needed (dry eyes).   pregabalin  (LYRICA ) 50 MG capsule Take 1 capsule (50 mg total) by mouth 2 (two) times daily.   sitaGLIPtin  (JANUVIA ) 50 MG tablet Take 1 tablet (50 mg total) by mouth daily.   [DISCONTINUED] dapagliflozin  propanediol (FARXIGA ) 10 MG TABS tablet Take 10 mg by mouth daily.   [DISCONTINUED] insulin  aspart (NOVOLOG  FLEXPEN) 100 UNIT/ML FlexPen Inject 8-14 Units into the skin 3 (three) times daily with meals. (Patient taking differently: Inject 8-14 Units into the skin 3 (three) times daily with meals. Patient states she is to get 6 units three times daily)   [DISCONTINUED] insulin  degludec (TRESIBA ) 100 UNIT/ML FlexTouch Pen Inject 25 Units into the skin daily.   fluconazole  (DIFLUCAN ) 100 MG tablet Take 1 tablet (100 mg total) by mouth daily. X 7 days (Patient not taking:  Reported on 05/11/2024)   insulin  aspart (NOVOLOG  FLEXPEN) 100 UNIT/ML FlexPen Inject 8-14 Units into the skin 3 (three) times daily with meals. 90-150= 8 units; 151-200=9 units; 201-250=10 units; 251-300= 11 units; 301-350= 12 units; 351-400=13 units; 401 or higher =14 units   insulin  degludec (TRESIBA ) 100 UNIT/ML FlexTouch Pen Inject 15 Units into the skin at bedtime.   No facility-administered encounter medications on file as of 05/11/2024.    ALLERGIES: Allergies  Allergen Reactions   Amoxicillin  Diarrhea    amoxicillin    Clarithromycin  Nausea And Vomiting and Other (See Comments)    Severe irritable bowel  clarithromycin    Demeclocycline Hives   Erythromycin Nausea And Vomiting and Other (See Comments)    Severe irritable bowel   Flagyl [Metronidazole] Nausea And Vomiting and Other (See Comments)    Severe irritable bowel   Glucophage [Metformin Hcl] Nausea And Vomiting and Other (See Comments)    Sick I won't take anything that has metformin in it   Tetracyclines & Related Hives and Rash   Potassium Chloride  Other (See Comments)    Cannot swallow potssium pills, able to tolerate liquid potassium   Tetracycline Other (See Comments)   Diovan [Valsartan] Nausea Only        Sulfa  Antibiotics Other (See Comments) and Rash    As child   Xanax [Alprazolam] Other (See Comments)    Hyperactivity     VACCINATION STATUS: Immunization History  Administered  Date(s) Administered   Fluad Quad(high Dose 65+) 03/06/2019, 06/07/2020, 07/27/2021, 07/13/2022   Fluad Trivalent(High Dose 65+) 03/29/2023   INFLUENZA, HIGH DOSE SEASONAL PF 08/21/2018   Influenza Split 06/13/2011   Influenza,inj,Quad PF,6+ Mos 05/04/2014, 05/12/2015   Influenza-Unspecified 04/15/2012   Moderna Sars-Covid-2 Vaccination 02/02/2020, 03/01/2020, 09/01/2020   Pneumococcal Conjugate-13 08/11/2013, 01/06/2014   Pneumococcal Polysaccharide-23 01/19/2010, 09/03/2018   Tdap 04/18/2016, 11/20/2020     Diabetes She presents for her follow-up diabetic visit. She has type 2 diabetes mellitus. Onset time: Diagnosed at approx age of 40. Her disease course has been fluctuating. There are no hypoglycemic associated symptoms. (syncope) Associated symptoms include fatigue, foot paresthesias, polydipsia and polyuria (on diuretics). Pertinent negatives for diabetes include no weight loss. There are no hypoglycemic complications. (Been known to have syncopal episodes in the past) Symptoms are stable. Diabetic complications include a CVA (incidental finding), heart disease, nephropathy and peripheral neuropathy. Risk factors for coronary artery disease include obesity, hypertension, diabetes mellitus, dyslipidemia, family history, sedentary lifestyle and post-menopausal. Current diabetic treatment includes intensive insulin  program and oral agent (monotherapy). She is compliant with treatment most of the time. Her weight is increasing rapidly. She is following a generally unhealthy (she admits to eating on the go a lot due to various appointments) diet. When asked about meal planning, she reported none. She has not had a previous visit with a dietitian. She rarely participates in exercise. Her home blood glucose trend is fluctuating dramatically. Her overall blood glucose range is 180-200 mg/dl. (She presents today, accompanied by her husband, with her CGM showing fluctuating glycemic profile with at target fasting and significantly above target postprandial readings.  Her POCT A1c today is 7.3%, improving from last visit of 7.6%.  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 Analysis of her CGM shows TIR 55%, TAR 43%, TBR 2% with a GMI of 7.8%.  She notes some dysuria and incontinence (of feces and urine) thinks her UTI is still lingering.) An ACE inhibitor/angiotensin II receptor blocker is not being taken. She sees a podiatrist.Eye exam is current.      Review of systems  Constitutional: + decreasing body weight, current Body mass index is 36.32 kg/m., + fatigue, no subjective hyperthermia, no subjective hypothermia, Eyes: no blurry vision, no xerophthalmia ENT: no sore throat, no nodules palpated in throat, no dysphagia/odynophagia, no hoarseness Cardiovascular: no chest pain, no shortness of breath, no palpitations Respiratory: no cough, + shortness of breath Gastrointestinal: no nausea/vomiting, on Creon  for EPI, fecal incontinence Genitourinary: dysuria- thinks UTI is still lingering, incontinence Musculoskeletal: no muscle/joint aches, WC bound, L-BKA (new since last visit) Skin: no rashes, no hyperemia Neurological: no tremors, no numbness, no tingling, no dizziness Psychiatric: no depression, no anxiety  Objective:     BP 98/64 (BP Location: Left Arm, Patient Position: Sitting, Cuff Size: Large)   Pulse 83   Ht 5' 4 (1.626 m) Comment: PER RECORDS FROM REHAB  Wt 211 lb 9.6 oz (96 kg) Comment: per records from Rehab 04/22/2024  BMI 36.32 kg/m   Wt Readings from Last 3 Encounters:  05/11/24 211 lb 9.6 oz (96 kg)  04/07/24 230 lb (104.3 kg)  03/31/24 230 lb (104.3 kg)     BP Readings from Last 3 Encounters:  05/11/24 98/64  05/05/24 104/72  04/30/24 104/69     Physical Exam- Limited  Constitutional:  Body mass index is 36.32 kg/m. , not in acute distress, normal  state of mind Eyes:  EOMI, no exophthalmos Musculoskeletal: WC bound, left BKA Skin:  no rashes, no hyperemia Neurological: no tremor with outstretched hands  Diabetic Foot Exam - Simple   No data filed     CMP ( most recent) CMP     Component Value Date/Time   NA 143 03/15/2024 0812   NA 137 10/21/2023 1417   NA 141 06/21/2014 1349   K 3.9 03/15/2024 0812   K 4.0 06/21/2014 1349   CL 108 03/15/2024 0812   CL 106 06/21/2014 1349   CO2 25 03/15/2024 0812   CO2 24 06/21/2014 1349   GLUCOSE 162 (H) 03/15/2024 0812   GLUCOSE 174 (H)  06/21/2014 1349   BUN 14 03/15/2024 0812   BUN 30 (H) 10/21/2023 1417   BUN 12 06/21/2014 1349   CREATININE 1.36 (H) 03/15/2024 0812   CREATININE 1.30 (H) 01/24/2024 1009   CALCIUM  8.7 (L) 03/15/2024 0812   CALCIUM  8.7 06/21/2014 1349   PROT 6.9 03/03/2024 1452   PROT 7.3 10/30/2012 1436   ALBUMIN  2.9 (L) 03/03/2024 1452   ALBUMIN  3.0 (L) 10/30/2012 1436   AST 18 03/03/2024 1452   AST 20 11/28/2023 1308   ALT 14 03/03/2024 1452   ALT 17 11/28/2023 1308   ALT 19 10/30/2012 1436   ALKPHOS 165 (H) 03/03/2024 1452   ALKPHOS 125 10/30/2012 1436   BILITOT 0.7 03/03/2024 1452   BILITOT 0.5 11/28/2023 1308   GFRNONAA 40 (L) 03/15/2024 0812   GFRNONAA 41 (L) 11/28/2023 1308   GFRNONAA 57 (L) 06/21/2014 1349   GFRNONAA 55 (L) 02/25/2014 2153   GFRNONAA 70 12/13/2011 0919   GFRAA >60 03/23/2020 1204   GFRAA >60 06/21/2014 1349   GFRAA >60 02/25/2014 2153   GFRAA 80 12/13/2011 0919     Diabetic Labs (most recent): Lab Results  Component Value Date   HGBA1C 7.6 (A) 01/07/2024   HGBA1C 9.3 (A) 09/30/2023   HGBA1C 9.5 (A) 06/24/2023   MICROALBUR 0.4 09/30/2023     Lipid Panel ( most recent) Lipid Panel     Component Value Date/Time   CHOL 90 10/09/2023 1204   CHOL 155 02/27/2014 0420   TRIG 100 10/09/2023 1204   TRIG 121 02/27/2014 0420   HDL 43 (L) 10/09/2023 1204   HDL 43 02/27/2014 0420   CHOLHDL 2.1 10/09/2023 1204   VLDL 20 10/23/2022 1324   VLDL 24 02/27/2014 0420   LDLCALC 28 10/09/2023 1204   LDLCALC 88 02/27/2014 0420   LDLDIRECT 126 (H) 07/13/2022 1529      Lab Results  Component Value Date   TSH 2.887 09/27/2022   TSH 2.209 02/08/2021   TSH 3.695 07/17/2020   TSH 3.50 01/01/2019   TSH 2.279 08/20/2017   TSH 4.400 06/16/2015   TSH 1.16 05/03/2015   TSH 0.408 (L) 10/29/2014   TSH 5.099 (H) 08/13/2014   TSH 0.93 12/03/2013   FREET4 1.10 05/03/2015           Assessment & Plan:   1) Type 2 diabetes mellitus with hyperglycemia, with long-term  current use of insulin  (HCC)  She presents today, accompanied by her husband, with her CGM showing fluctuating glycemic profile with at target fasting and significantly above target postprandial readings.  Her POCT A1c today is 7.3%, improving from last visit of 7.6%.  Since last visit she had L-BKA, is now in [place] rehab [place] who has adjusted her DM meds. Analysis of her CGM shows TIR 55%,  TAR 43%, TBR 2% with a GMI of 7.8%.  She notes some dysuria and incontinence (of feces and urine) thinks her UTI is still lingering.  - Denise Sharp has currently uncontrolled symptomatic type 2 DM since 78 years of age.   -Recent labs reviewed.  - I had a long discussion with her about the progressive nature of diabetes and the pathology behind its complications. -her diabetes is complicated by CAD, CHF, Neuropathy, CKD stage 3, CVA and she remains at a high risk for more acute and chronic complications which include CAD, CVA, CKD, retinopathy, and neuropathy. These are all discussed in detail with her.  The following Lifestyle Medicine recommendations according to American College of Lifestyle Medicine [facility]) were discussed and offered to patient and she agrees to start the journey:  A. Whole Foods, Plant-based plate comprising of fruits and vegetables, plant-based proteins, whole-grain carbohydrates was discussed in detail with the patient.   A list for source of those nutrients were also provided to the patient.  Patient will use only water  or unsweetened tea for hydration. B.  The need to stay away from risky substances including alcohol , smoking; obtaining 7 to 9 hours of restorative sleep, at least 150 minutes of moderate intensity exercise weekly, the importance of healthy social connections,  and stress reduction techniques were discussed. C.  A full color page of  Calorie density of various food groups per pound showing examples of each food groups was provided to the patient.  -  Nutritional counseling repeated/built upon at each appointment.  - The patient admits there is a room for improvement in their diet and drink choices. -  Suggestion is made for the patient to avoid simple carbohydrates from their diet including Cakes, Sweet Desserts / Pastries, Ice Cream, Soda (diet and regular), Sweet Tea, Candies, Chips, Cookies, Sweet Pastries, Store Bought Juices, Alcohol  in Excess of 1-2 drinks a day, Artificial Sweeteners, Coffee Creamer, and Sugar-free Products. This will help patient to have stable blood glucose profile and potentially avoid unintended weight gain.   - I encouraged the patient to switch to unprocessed or minimally processed complex starch and increased protein intake (animal or plant source), fruits, and vegetables.   - Patient is advised to stick to a routine mealtimes to eat 3 meals a day and avoid unnecessary snacks (to snack only to correct hypoglycemia).  - I have approached her with the following individualized plan to manage her diabetes and patient agrees:   -She is advised to restart her Tresiba  15 units SQ nightly (has been taking 25 units in AM at facility) and adjust Novolog  to 8-14 units TID with meals if glucose is above 70 and she is eating (Specific instructions on how to titrate insulin  dosage based on glucose readings given to patient in writing).  I did stop her Farxiga  today which may be contributing to her recurrent UTIs.  I increased her Januvia  to 50 mg daily (max dose for renal purposes).  -she is encouraged to continue monitoring glucose 4 times daily (using her CGM), before meals and before bed, and to call the clinic if she has readings less than 70 or above 300 for 3 tests in a row.  - she is warned not to take insulin  without proper monitoring per orders. - Adjustment parameters are given to her for hypo and hyperglycemia in writing.  - she is not a candidate for Metformin due to concurrent renal insufficiency, nor did she  tolerate it in the past.  She also did not tolerate GLP1 products due to nausea previously.  - Specific targets for  A1c; LDL, HDL, and Triglycerides were discussed with the patient.  2) Blood Pressure /Hypertension:  her blood pressure is controlled to target-slightly low today in office.   she is advised to continue her current medications per cardiology/PCP.  3) Lipids/Hyperlipidemia:    Review of her recent lipid panel from 10/23/22 showed controlled LDL at 45 .  she is advised to continue Lipitor  80 mg daily at bedtime and Zetia  10 mg po daily.  Side effects and precautions discussed with her.  4)  Weight/Diet:  her Body mass index is 36.32 kg/m.  -  clearly complicating her diabetes care.   she is a candidate for weight loss. I discussed with her the fact that loss of 5 - 10% of her  current body weight will have the most impact on her diabetes management.  Exercise, and detailed carbohydrates information provided  -  detailed on discharge instructions.  She has had gastric bypass surgery x 2.  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  Recent imaging of abdomen (done for different reason) shows stable mass, still consistent with benignity.  She will not need additional follow up for this.  6) Chronic Care/Health Maintenance: -she is not on ACEI/ARB and is on Statin medications and is encouraged to initiate and continue to follow up with Ophthalmology, Dentist, Podiatrist at least yearly or according to recommendations, and advised to stay away from smoking. I have recommended yearly flu vaccine and pneumonia vaccine at least every 5 years; moderate intensity exercise for up to 150 minutes weekly; and sleep for at least 7 hours a day.  - she is advised to maintain close follow up with Tullo, Teresa L, MD for primary care needs, as well as her other providers for optimal and coordinated care.      I spent  56  minutes in the care of the patient today  including review of labs from CMP, Lipids, Thyroid  Function, Hematology (current and previous including abstractions from other facilities); face-to-face time discussing  her blood glucose readings/logs, discussing hypoglycemia and hyperglycemia episodes and symptoms, medications doses, her options of short and long term treatment based on the latest standards of care / guidelines;  discussion about incorporating lifestyle medicine;  and documenting the encounter. Risk reduction counseling performed per USPSTF guidelines to reduce obesity and cardiovascular risk factors.     Please refer to Patient Instructions for Blood Glucose Monitoring and Insulin /Medications Dosing Guide  in media tab for additional information. Please  also refer to  Patient Self Inventory in the Media  tab for reviewed elements of pertinent patient history.  Denise Sharp participated in the discussions, expressed understanding, and voiced agreement with the above plans.  All questions were answered to her satisfaction. she is encouraged to contact clinic should she have any questions or concerns prior to her return visit.     Follow up plan: - Return in about 3 months (around 08/11/2024) for Diabetes F/U with A1c in office, No previsit labs, Bring meter and logs.  Benton Rio, [facility] [address] [place], [place] [address] Phone: [phone] Fax: [phone]  05/11/2024, 4:24 PM

## 2024-05-12 ENCOUNTER — Ambulatory Visit (INDEPENDENT_AMBULATORY_CARE_PROVIDER_SITE_OTHER): Admitting: Physician Assistant

## 2024-05-12 DIAGNOSIS — N3946 Mixed incontinence: Secondary | ICD-10-CM | POA: Diagnosis not present

## 2024-05-12 NOTE — Progress Notes (Signed)
 PTNS   Session # 10   Health & Social Factors: no change Caffeine : 0 Alcohol : 0 Daytime voids #per day: 6-7 Night-time voids #per night: 2 Urgency: severe Incontinence Episodes #per day: all Ankle used:  Treatment Setting: Feeling/ Response: sensory Comments: Patient tolerated well. She is having ongoing, intense dysuria. CT scan was scheduled for yesterday, but she was not taken to the appointment.   Performed By: Samantha Vaillancourt, PA-C    Follow Up: 1 week

## 2024-05-12 NOTE — Patient Instructions (Signed)
 Tracking Your Bladder Symptoms   Patient Name:___________________________________________________  Example: Day   Daytime Voids  Nighttime Voids Urgency for the Day (none, mild, strong, severe) Number of Accidents/ Leaks Beverage Comments  Monday IIII II Strong I Water  IIII Coffee  I     Week Starting:____________________________________  Day Daytime  Voids Nighttime  Voids Urgency for the Day (none, mild, strong, severe) Number of Accidents/ Leaks Beverages Comments                                                           This week my symptoms were:  O much better O better O the same O worse

## 2024-05-12 NOTE — Progress Notes (Signed)
 PTNS  Session # 10  Health & Social Factors: Loose stool. Adjusting diabetes regimen; stopped Jardiance. CT stone study negative for obstructing stone. Caffeine : 2 Alcohol : 0 Daytime voids #per day: 6 Night-time voids #per night: 2 Urgency: severe Incontinence Episodes #per day: 6 Ankle used: right Treatment Setting: 13 Feeling/ Response: sensory Comments: Patient tolerated well.   Performed By: Keean Wilmeth, PA-C   Follow Up: 1 week

## 2024-05-15 DIAGNOSIS — E119 Type 2 diabetes mellitus without complications: Secondary | ICD-10-CM | POA: Diagnosis not present

## 2024-05-15 DIAGNOSIS — E568 Deficiency of other vitamins: Secondary | ICD-10-CM | POA: Diagnosis not present

## 2024-05-15 DIAGNOSIS — F3289 Other specified depressive episodes: Secondary | ICD-10-CM | POA: Diagnosis not present

## 2024-05-19 ENCOUNTER — Inpatient Hospital Stay: Admitting: Nurse Practitioner

## 2024-05-19 ENCOUNTER — Inpatient Hospital Stay: Attending: Internal Medicine

## 2024-05-19 ENCOUNTER — Inpatient Hospital Stay

## 2024-05-19 DIAGNOSIS — I5032 Chronic diastolic (congestive) heart failure: Secondary | ICD-10-CM | POA: Insufficient documentation

## 2024-05-19 DIAGNOSIS — Z9884 Bariatric surgery status: Secondary | ICD-10-CM | POA: Insufficient documentation

## 2024-05-19 DIAGNOSIS — Z794 Long term (current) use of insulin: Secondary | ICD-10-CM | POA: Diagnosis not present

## 2024-05-19 DIAGNOSIS — E785 Hyperlipidemia, unspecified: Secondary | ICD-10-CM | POA: Insufficient documentation

## 2024-05-19 DIAGNOSIS — E876 Hypokalemia: Secondary | ICD-10-CM | POA: Diagnosis not present

## 2024-05-19 DIAGNOSIS — E1122 Type 2 diabetes mellitus with diabetic chronic kidney disease: Secondary | ICD-10-CM | POA: Insufficient documentation

## 2024-05-19 DIAGNOSIS — Z7901 Long term (current) use of anticoagulants: Secondary | ICD-10-CM | POA: Insufficient documentation

## 2024-05-19 DIAGNOSIS — I130 Hypertensive heart and chronic kidney disease with heart failure and stage 1 through stage 4 chronic kidney disease, or unspecified chronic kidney disease: Secondary | ICD-10-CM | POA: Insufficient documentation

## 2024-05-19 DIAGNOSIS — D509 Iron deficiency anemia, unspecified: Secondary | ICD-10-CM

## 2024-05-19 DIAGNOSIS — E1165 Type 2 diabetes mellitus with hyperglycemia: Secondary | ICD-10-CM | POA: Insufficient documentation

## 2024-05-19 DIAGNOSIS — Z8042 Family history of malignant neoplasm of prostate: Secondary | ICD-10-CM | POA: Insufficient documentation

## 2024-05-19 DIAGNOSIS — M1990 Unspecified osteoarthritis, unspecified site: Secondary | ICD-10-CM | POA: Insufficient documentation

## 2024-05-19 DIAGNOSIS — I2510 Atherosclerotic heart disease of native coronary artery without angina pectoris: Secondary | ICD-10-CM | POA: Diagnosis not present

## 2024-05-19 DIAGNOSIS — I509 Heart failure, unspecified: Secondary | ICD-10-CM | POA: Diagnosis not present

## 2024-05-19 DIAGNOSIS — K625 Hemorrhage of anus and rectum: Secondary | ICD-10-CM | POA: Diagnosis not present

## 2024-05-19 DIAGNOSIS — M109 Gout, unspecified: Secondary | ICD-10-CM | POA: Insufficient documentation

## 2024-05-19 DIAGNOSIS — Z7984 Long term (current) use of oral hypoglycemic drugs: Secondary | ICD-10-CM | POA: Insufficient documentation

## 2024-05-19 DIAGNOSIS — K909 Intestinal malabsorption, unspecified: Secondary | ICD-10-CM | POA: Insufficient documentation

## 2024-05-19 DIAGNOSIS — K222 Esophageal obstruction: Secondary | ICD-10-CM | POA: Diagnosis not present

## 2024-05-19 DIAGNOSIS — G2581 Restless legs syndrome: Secondary | ICD-10-CM | POA: Insufficient documentation

## 2024-05-19 DIAGNOSIS — N183 Chronic kidney disease, stage 3 unspecified: Secondary | ICD-10-CM | POA: Diagnosis not present

## 2024-05-19 DIAGNOSIS — E538 Deficiency of other specified B group vitamins: Secondary | ICD-10-CM | POA: Insufficient documentation

## 2024-05-19 DIAGNOSIS — Z79899 Other long term (current) drug therapy: Secondary | ICD-10-CM | POA: Diagnosis not present

## 2024-05-19 DIAGNOSIS — E1142 Type 2 diabetes mellitus with diabetic polyneuropathy: Secondary | ICD-10-CM | POA: Insufficient documentation

## 2024-05-19 LAB — CMP (CANCER CENTER ONLY)
ALT: 21 U/L (ref 0–44)
AST: 23 U/L (ref 15–41)
Albumin: 2.6 g/dL — ABNORMAL LOW (ref 3.5–5.0)
Alkaline Phosphatase: 166 U/L — ABNORMAL HIGH (ref 38–126)
Anion gap: 11 (ref 5–15)
BUN: 25 mg/dL — ABNORMAL HIGH (ref 8–23)
CO2: 23 mmol/L (ref 22–32)
Calcium: 8.1 mg/dL — ABNORMAL LOW (ref 8.9–10.3)
Chloride: 104 mmol/L (ref 98–111)
Creatinine: 1.44 mg/dL — ABNORMAL HIGH (ref 0.44–1.00)
GFR, Estimated: 37 mL/min — ABNORMAL LOW (ref >60)
Glucose, Bld: 111 mg/dL — ABNORMAL HIGH (ref 70–99)
Potassium: 3.4 mmol/L — ABNORMAL LOW (ref 3.5–5.1)
Sodium: 138 mmol/L (ref 135–145)
Total Bilirubin: 0.5 mg/dL (ref 0.0–1.2)
Total Protein: 6.1 g/dL — ABNORMAL LOW (ref 6.5–8.1)

## 2024-05-19 LAB — CBC WITH DIFFERENTIAL (CANCER CENTER ONLY)
Abs Immature Granulocytes: 0.18 K/uL — ABNORMAL HIGH (ref 0.00–0.07)
Basophils Absolute: 0.1 K/uL (ref 0.0–0.1)
Basophils Relative: 1 %
Eosinophils Absolute: 0.4 K/uL (ref 0.0–0.5)
Eosinophils Relative: 4 %
HCT: 38.9 % (ref 36.0–46.0)
Hemoglobin: 12.1 g/dL (ref 12.0–15.0)
Immature Granulocytes: 1 %
Lymphocytes Relative: 32 %
Lymphs Abs: 4 K/uL (ref 0.7–4.0)
MCH: 29.5 pg (ref 26.0–34.0)
MCHC: 31.1 g/dL (ref 30.0–36.0)
MCV: 94.9 fL (ref 80.0–100.0)
Monocytes Absolute: 0.8 K/uL (ref 0.1–1.0)
Monocytes Relative: 7 %
Neutro Abs: 6.9 K/uL (ref 1.7–7.7)
Neutrophils Relative %: 55 %
Platelet Count: 304 K/uL (ref 150–400)
RBC: 4.1 MIL/uL (ref 3.87–5.11)
RDW: 16.6 % — ABNORMAL HIGH (ref 11.5–15.5)
WBC Count: 12.5 K/uL — ABNORMAL HIGH (ref 4.0–10.5)
nRBC: 0 % (ref 0.0–0.2)

## 2024-05-19 LAB — IRON AND TIBC
Iron: 44 ug/dL (ref 28–170)
Saturation Ratios: 26 % (ref 10.4–31.8)
TIBC: 168 ug/dL — ABNORMAL LOW (ref 250–450)
UIBC: 124 ug/dL

## 2024-05-19 LAB — VITAMIN B12: Vitamin B-12: 548 pg/mL (ref 180–914)

## 2024-05-19 LAB — FERRITIN: Ferritin: 811 ng/mL — ABNORMAL HIGH (ref 11–307)

## 2024-05-19 NOTE — Addendum Note (Signed)
 Addended by: PURCELL REENA GRADE on: 05/19/2024 04:38 PM   Modules accepted: Orders

## 2024-05-19 NOTE — Progress Notes (Signed)
 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 Ethan EGD/colonoscopy- ?2021 [Dr.Wohl]. NOV 2021-hemoglobin 10. Iron  saturation 5% ferritin 12. EGD- NOV 2022- Dr.Wohl.  # GASTRIC BYPASS x 2 [2005]; CAD/CHF [s/p stent; Dr.End]; CKD stage III [Dr. Korrapati]; poorly controlled diabetes- [Dr.Tullo]  HISTORY OF PRESENTING ILLNESS: Alone. Walking with a rolling walker.  Sharp Sharp 78 y.o. female with multiple medical comorbidities including CHF on diuretics, hypokalemia, iron  deficiency, secondary to gastric bypass, poorly controlled DM, who returns to clinic for follow up.  Continues to recover post amputation of the LLE d/t complications of diabetes.    Review of Systems  Constitutional:  Positive for malaise/fatigue. Negative for chills, diaphoresis, fever and weight loss.  HENT:  Negative for nosebleeds.   Eyes:  Negative for double vision.  Respiratory:  Negative for cough, hemoptysis, sputum production, shortness of breath and wheezing.   Cardiovascular:  Negative for chest pain, palpitations, orthopnea and leg swelling.  Gastrointestinal:  Negative for abdominal pain, blood in stool, constipation, heartburn, melena, nausea and vomiting.  Genitourinary:  Negative for dysuria, flank pain, frequency, hematuria and urgency.  Musculoskeletal:  Positive for back pain, joint pain and myalgias. Negative for falls and neck pain.  Skin:  Negative for itching and rash.  Neurological:  Negative for dizziness, tingling, focal weakness, weakness and headaches.   Endo/Heme/Allergies:  Does not bruise/bleed easily.  Psychiatric/Behavioral:  Negative for depression. The patient is not nervous/anxious and does not have insomnia.    MEDICAL HISTORY:  Past Medical History:  Diagnosis Date   (HFpEF) heart failure with preserved ejection fraction (HCC)    a. 12/2019 Echo: EF 65-70%, Gr2 DD. No significant valvular dzs.   Abnormality of gait 03/25/2013   Adrenal mass, left    Anginal pain    Anxiety    Aortic atherosclerosis    Arthritis    Asthma    CAD (coronary artery disease) with h/o Atypical Chest Pain    a. 04/1986 Cath ([facility]): nl cors, EF 65%; b. 07/2012 Cath [facility]): Diff minor irregs; c. 07/2016 MV [place]): Equivocal; d. 08/2016 Cardiac CT Ca2+ score [facility]): Ca2+ 1548; e. 05/2019 MV: No ischemia. EF 75%; f. 12/2019 PCI: LM nl, LAD 65ost (3.5x12 Resolute DES), 49m (2.75x12 Resolute DES),LCX/RCA nl; g. 08/2020 Cath: patent LAD stents, RCA 40ost, elev RH pressures, EF >65%.   Cervical spinal stenosis 1994   due to trauma to back ([employer] accident), has intermittent paralysis and parasthesias   Cervicogenic headache 03/23/2014   CKD (chronic kidney disease), stage III (HCC)    Depression    Diverticulosis    Dizziness    a.) chronic   DJD (degenerative joint disease)    a. Chronic R shoulder pain   Dyspnea    Esophageal stenosis 09/2009   a.) transient outlet obstruction by food, cleared by EGD   Family history of adverse reaction to anesthesia    a.) daughter with (+) PONV   Gastric bypass status for obesity    GERD (gastroesophageal reflux disease)    Gout    Headache(784.0)    HLD (hyperlipidemia)    Hypertension    IBS (irritable bowel syndrome)  IDA (iron  deficiency anemia)    a.) post 2 unit txfsn 2009, normal endo/colonoscopy by [facility]   Left bundle branch block (LBBB)    a.) Intermittently present - likely rate related.   NSVT (nonsustained ventricular tachycardia) (HCC) 11/15/2020   a.) Holter study 11/15/2020; 11 episodes of  NSVT lasting up to 8 beats with a maximum rate of 210 bpm; 14 atrial runs lasting up to 18 beats with a rate of up to 250 bpm.  Some atrial runs felt to be NSVT.   NSVT (nonsustained ventricular tachycardia) (HCC)    a. 10/2020 [facility]: 11 runs of NSVT up to 8 beats.   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 10/2020 [facility]io: 14 atrial runs up to 18 beats, max HR 250.   Restless leg syndrome    Rotator cuff arthropathy, right 08/13/2017   Syncope and collapse 03/12/2014   Type II diabetes mellitus (HCC)     SURGICAL HISTORY: Past Surgical History:  Procedure Laterality Date   AMPUTATION Left 03/07/2024   Procedure: AMPUTATION BELOW KNEE;  Surgeon: Tisa Curry LABOR, MD;  Location: [facility] ORS;  Service: Vascular;  Laterality: Left;   APPENDECTOMY     BACK SURGERY     CARDIOVASCULAR STRESS TEST     CARDIOVERSION N/A 10/30/2022   Procedure: CARDIOVERSION;  Surgeon: Mady Bruckner, MD;  Location: [facility] ORS;  Service: Cardiovascular;  Laterality: N/A;   CHOLECYSTECTOMY N/A 1976   COLONOSCOPY     CORONARY ANGIOPLASTY     CORONARY STENT INTERVENTION N/A 01/04/2020   Procedure: CORONARY STENT INTERVENTION;  Surgeon: Darron Deatrice LABOR, MD;  Location: [facility] INVASIVE CV LAB;  Service: Cardiovascular;  Laterality: N/A;  LAD    DIAPHRAGMATIC HERNIA REPAIR  2015   ESOPHAGEAL DILATION     multiple   ESOPHAGOGASTRODUODENOSCOPY (EGD) WITH PROPOFOL  N/A 05/11/2021   Procedure: ESOPHAGOGASTRODUODENOSCOPY (EGD) WITH PROPOFOL ;  Surgeon: Jinny Carmine, MD;  Location: [facility] END[facility];  Service: Endoscopy;  Laterality: N/A;   ESOPHAGOGASTRODUODENOSCOPY (EGD) WITH PROPOFOL  N/A 06/13/2021   Procedure: ESOPHAGOGASTRODUODENOSCOPY (EGD) WITH PROPOFOL ;  Surgeon: Jinny Carmine, MD;  Location: [facility] END[facility];  Service: Endoscopy;  Laterality: N/A;   ESOPHAGOGASTRODUODENOSCOPY (EGD) WITH  PROPOFOL  N/A 02/28/2023   Procedure: ESOPHAGOGASTRODUODENOSCOPY (EGD) WITH PROPOFOL ;  Surgeon: Jinny Carmine, MD;  Location: [facility] END[facility];  Service: Endoscopy;  Laterality: N/A;   EYE SURGERY     GASTRIC BYPASS  2000, 2005   Dr. Mia REDBIRD IMPLANT PLACEMENT  10/2011   Ike REDBIRD IMPLANT PLACEMENT N/A 09/18/2021   Procedure: REDBIRD IMPLANT FIRST STAGE;  Surgeon: Gaston [facility], MD;  Location: [facility];  Service: Urology;  Laterality: N/A;   INTERSTIM IMPLANT PLACEMENT N/A 09/18/2021   Procedure: REDBIRD IMPLANT SECOND STAGE WITH IMPEDENCE CHECK;  Surgeon: Gaston [facility], MD;  Location: [facility];  Service: Urology;  Laterality: N/A;   INTERSTIM IMPLANT REMOVAL N/A 09/18/2021   Procedure: REMOVAL OF INTERSTIM IMPLANT;  Surgeon: Gaston [facility], MD;  Location: [facility];  Service: Urology;  Laterality: N/A;   LEFT HEART CATH AND CORONARY ANGIOGRAPHY N/A 04/1986   LEFT HEART CATH AND CORONARY ANGIOGRAPHY Left 07/24/2012   LEFT HEART CATH AND CORS/GRAFTS ANGIOGRAPHY N/A 12/31/2019   Procedure: LEFT HEART CATH AND CORS/GRAFTS ANGIOGRAPHY;  Surgeon: Perla Evalene PARAS, MD;  Location: [facility];  Service: Cardiovascular;  Laterality: N/A;   PANNICULECTOMY  N/A 06/16/2019   Procedure: PANNICULECTOMY;  Surgeon: Elisabeth Craig RAMAN, MD;  Location: [facility] OR;  Service: Plastics;  Laterality: N/A;  3 hours, please   REVERSE SHOULDER ARTHROPLASTY Right 08/13/2017   Procedure: REVERSE RIGHT SHOULDER ARTHROPLASTY;  Surgeon: Josefina Chew, MD;  Location: [facility] OR;  Service: Orthopedics;  Laterality: Right;   RIGHT/LEFT HEART CATH AND CORONARY ANGIOGRAPHY N/A 09/06/2020   Procedure: RIGHT/LEFT HEART CATH AND CORONARY ANGIOGRAPHY;  Surgeon: Mady Bruckner, MD;  Location: [facility];  Service: Cardiovascular;  Laterality: N/A;   RIGHT/LEFT HEART CATH AND CORONARY ANGIOGRAPHY N/A 06/04/2022   Procedure: RIGHT/LEFT HEART CATH AND CORONARY ANGIOGRAPHY;  Surgeon: Mady Bruckner, MD;  Location: [facility];  Service: Cardiovascular;  Laterality: N/A;   ROTATOR CUFF REPAIR Right    SPINE SURGERY  1995   [place]   TOOTH EXTRACTION  11/13/2021   TOTAL ABDOMINAL HYSTERECTOMY W/ BILATERAL SALPINGOOPHORECTOMY  1974   TOTAL KNEE ARTHROPLASTY Bilateral 2007   Procedure: TOTAL KNEE ARTHROPLASTY; Surgeons: Kathi, MD and [name], MD   UMBILICAL HERNIA REPAIR  08/11/2015    SOCIAL HISTORY: Social History   Socioeconomic History   Marital status: Married    Spouse name: Koren    Number of children: 2   Years of education: College    Highest education level: Not on file  Occupational History    Employer: retired  Tobacco Use   Smoking status: Never    Passive exposure: Never   Smokeless tobacco: Never  Vaping Use   Vaping status: Never Used  Substance and Sexual Activity   Alcohol  use: No   Drug use: Not Currently    Comment: prescribed valium    Sexual activity: Not Currently    Birth control/protection: Post-menopausal, Surgical  Other Topics Concern   Not on file  Social History Narrative   Patient lives at home with husband Koren.    Right Handed   Drinks caffeinated tea occasionally   One pet in house, dog   Social Drivers of Health   Financial Resource Strain: Low Risk  (09/15/2021)   Overall Financial Resource Strain (CARDIA)    Difficulty of Paying Living Expenses: Not hard at all  Food Insecurity: No Food Insecurity (03/04/2024)   Hunger Vital Sign    Worried About Running Out of Food in the Last Year: Never true    Ran Out of Food in the Last Year: Never true  Transportation Needs: No Transportation Needs (03/04/2024)   PRAPARE - Administrator, Civil Service (Medical): No    Lack of Transportation (Non-Medical): No  Physical Activity: Insufficiently Active (11/15/2020)   Exercise Vital Sign    Days of Exercise per Week: 2 days    Minutes of Exercise per Session: 40 min  Stress: Stress Concern Present (08/03/2020)    [employer] of Occupational Health - Occupational Stress Questionnaire    Feeling of Stress : Rather much  Social Connections: Unknown (03/04/2024)   Social Connection and Isolation Panel    Frequency of Communication with Friends and Family: Patient declined    Frequency of Social Gatherings with Friends and Family: Patient declined    Attends Religious Services: Never    Database Administrator or Organizations: Patient declined    Attends Banker Meetings: Patient declined    Marital Status: Patient declined  Intimate Partner Violence: Not At Risk (03/04/2024)   Humiliation, Afraid, Rape, and Kick questionnaire    Fear of Current or Ex-Partner: No  Emotionally Abused: No    Physically Abused: No    Sexually Abused: No    FAMILY HISTORY: Family History  Problem Relation Age of Onset   Heart disease Father    Hypertension Father    Prostate cancer Father    Stroke Father    Osteoporosis Father    Stroke Mother    Depression Mother    Headache Mother    Heart disease Mother    Thyroid  disease Mother    Hypertension Mother    Diabetes Daughter    Heart disease Daughter    Hypertension Daughter    Hypertension Son     ALLERGIES:  is allergic to amoxicillin , clarithromycin , demeclocycline, erythromycin, flagyl [metronidazole], glucophage [metformin hcl], tetracyclines & related, potassium chloride , tetracycline, diovan [valsartan], sulfa  antibiotics, and xanax [alprazolam].  MEDICATIONS:  Current Outpatient Medications  Medication Sig Dispense Refill   acetaminophen  (TYLENOL ) 325 MG tablet Take 2 tablets (650 mg total) by mouth every 6 (six) hours as needed for mild pain (pain score 1-3).     allopurinol  (ZYLOPRIM ) 100 MG tablet Take 100 mg by mouth daily.     apixaban  (ELIQUIS ) 5 MG TABS tablet Take 5 mg by mouth 2 (two) times daily.     atorvastatin  (LIPITOR ) 80 MG tablet Take 80 mg by mouth daily.     budesonide -formoterol  (SYMBICORT ) 160-4.5 MCG/ACT  inhaler Inhale 2 puffs into the lungs 2 (two) times daily.     cetirizine (ZYRTEC) 10 MG tablet Take 10 mg by mouth daily.     colchicine  0.6 MG tablet Take 0.6 mg by mouth daily.     conjugated estrogens  (PREMARIN ) vaginal cream Apply one pea-sized amount around the opening of the urethra daily for 2 weeks, then 3 times weekly moving forward. 30 g 4   dicyclomine  (BENTYL ) 10 MG capsule Take 20 mg by mouth 4 (four) times daily -  before meals and at bedtime.     DULoxetine  (CYMBALTA ) 60 MG capsule Take 60 mg by mouth daily.     pantoprazole  (PROTONIX ) 40 MG tablet Take 40 mg by mouth daily.     torsemide  (DEMADEX ) 20 MG tablet Take 20 mg by mouth daily.     trolamine salicylate (ASPERCREME) 10 % cream Apply 1 Application topically daily.     clotrimazole -betamethasone  (LOTRISONE ) cream Apply 1 application topically 2 (two) times daily as needed (irritation).     Continuous Glucose Sensor (FREESTYLE LIBRE 3 PLUS SENSOR) MISC Change sensor every 15 days. 6 each 3   dicyclomine  (BENTYL ) 20 MG tablet Take 1 tablet (20 mg total) by mouth 3 (three) times daily before meals. 180 tablet 2   Finerenone  (KERENDIA ) 10 MG TABS Take 1 tablet (10 mg total) by mouth in the morning. 90 tablet 3   fluconazole  (DIFLUCAN ) 100 MG tablet Take 1 tablet (100 mg total) by mouth daily. X 7 days (Patient not taking: Reported on 05/11/2024) 7 tablet 0   fluticasone  (FLONASE ) 50 MCG/ACT nasal spray Place 2 sprays into both nostrils daily. 16 g 6   Glucagon  (GVOKE HYPOPEN  2-PACK) 0.5 MG/0.1ML SOAJ Inject 1 auto injector subcutaneously as needed for severe hypoglycemia. May repeat x 1 after 15 minutes if needed. 0.1 mL 2   glucose blood (ONETOUCH VERIO) test strip Use to check blood sugar twice daily. 100 each 1   insulin  aspart (NOVOLOG  FLEXPEN) 100 UNIT/ML FlexPen Inject 8-14 Units into the skin 3 (three) times daily with meals. 90-150= 8 units; 151-200=9 units; 201-250=10 units; 251-300= 11 units; 301-350=  12 units;  351-400=13 units; 401 or higher =14 units 36 mL 3   insulin  degludec (TRESIBA ) 100 UNIT/ML FlexTouch Pen Inject 15 Units into the skin at bedtime. 15 mL 1   Insulin  Pen Needle (DROPLET PEN NEEDLES) 31G X 5 MM MISC USE ONE NEEDLE SUBCUTANEOUSLY AS DIRECTED (REMOVE AND DISCARD NEEDLE IN SHARPS CONTAINER IMMEDIATELY AFTER USE) 100 each 1   loperamide  (IMODIUM ) 2 MG capsule Take 4-6 mg by mouth 3 (three) times daily before meals.     midodrine (PROAMATINE) 5 MG tablet Take 5 mg by mouth as needed.     nystatin  (MYCOSTATIN /NYSTOP ) powder Apply 1 g topically 4 (four) times daily as needed (rash).     OneTouch Delica Lancets 33G MISC Used to check blood sugar two times a day. 100 each 4   oxycodone  (OXY-IR) 5 MG capsule Take 5 mg by mouth every 4 (four) hours as needed. 2.5 mg as needed every 6 hours (Patient taking differently: Take 2.5 mg by mouth every 4 (four) hours as needed. 2.5 mg as needed every 6 hours)     Polyethyl Glycol-Propyl Glycol (SYSTANE OP) Place 1 drop into both eyes 4 (four) times daily as needed (dry eyes).     pregabalin  (LYRICA ) 50 MG capsule Take 1 capsule (50 mg total) by mouth 2 (two) times daily. 180 capsule 1   sitaGLIPtin  (JANUVIA ) 50 MG tablet Take 1 tablet (50 mg total) by mouth daily. 90 tablet 1   No current facility-administered medications for this visit.     PHYSICAL EXAMINATION: There were no vitals filed for this visit.  There were no vitals filed for this visit.  Physical Exam Constitutional:      Appearance: She is not ill-appearing.  Eyes:     General: No scleral icterus.    Conjunctiva/sclera: Conjunctivae normal.  Cardiovascular:     Rate and Rhythm: Normal rate and regular rhythm.  Abdominal:     General: There is no distension.     Palpations: Abdomen is soft.     Tenderness: There is no abdominal tenderness. There is no guarding.  Musculoskeletal:        General: Deformity present.     Right lower leg: No edema.  Lymphadenopathy:      Cervical: No cervical adenopathy.  Skin:    General: Skin is warm and dry.     Coloration: Skin is not pale.  Neurological:     Mental Status: She is alert and oriented to person, place, and time.  Psychiatric:        Mood and Affect: Mood normal.        Behavior: Behavior normal.    LABORATORY DATA:  I have reviewed the data as listed Lab Results  Component Value Date   WBC 12.5 (H) 05/19/2024   HGB 12.1 05/19/2024   HCT 38.9 05/19/2024   MCV 94.9 05/19/2024   PLT 304 05/19/2024   Recent Labs    02/01/24 0414 02/01/24 2145 03/03/24 1452 03/04/24 0548 03/13/24 0636 03/15/24 0812 05/19/24 1416  NA 135   < > 138   < > 141 143 138  K 5.2*   < > 3.8   < > 3.4* 3.9 3.4*  CL 101   < > 105   < > 106 108 104  CO2 21*   < > 22   < > 25 25 23   GLUCOSE 364*   < > 271*   < > 128* 162* 111*  BUN 24*   < >  11   < > 21 14 25*  CREATININE 1.34*   < > 1.08*   < > 1.63* 1.36* 1.44*  CALCIUM  8.7*   < > 8.6*   < > 8.2* 8.7* 8.1*  GFRNONAA 41*   < > 53*   < > 32* 40* 37*  PROT 6.8  --  6.9  --   --   --  6.1*  ALBUMIN  2.6*  --  2.9*  --   --   --  2.6*  AST 21  --  18  --   --   --  23  ALT 18  --  14  --   --   --  21  ALKPHOS 142*  --  165*  --   --   --  166*  BILITOT 0.7  --  0.7  --   --   --  0.5   < > = values in this interval not displayed.   Iron /TIBC/Ferritin/ %Sat    Component Value Date/Time   IRON  33 03/12/2024 0549   IRON  33 (L) 10/30/2012 1436   TIBC 113 (L) 03/12/2024 0549   TIBC 312 10/30/2012 1436   FERRITIN 2,782 (H) 03/12/2024 0549   FERRITIN 36 10/30/2012 1436   IRONPCTSAT 29 03/12/2024 0549   IRONPCTSAT 8 (L) 03/06/2019 1624     CT RENAL STONE STUDY Result Date: 05/11/2024 EXAM: CT UROGRAM 05/08/2024 11:14:30 AM TECHNIQUE: CT of the abdomen and pelvis was performed without intravenous contrast as per CT urogram protocol. Multiplanar reformatted images as well as MIP urogram images are provided for review. Automated exposure control, iterative  reconstruction, and/or weight based adjustment of the mA/kV was utilized to reduce the radiation dose to as low as reasonably achievable. COMPARISON: 03/29/2023. CLINICAL HISTORY: Abdominal/flank pain, stone suspected. Prolonged UTI. FINDINGS: LOWER CHEST: No acute abnormality. LIVER: The liver is unremarkable. GALLBLADDER AND BILE DUCTS: Prior cholecystectomy. No biliary ductal dilatation. SPLEEN: No acute abnormality. PANCREAS: No acute abnormality. ADRENAL GLANDS: Stable small left adrenal nodule, consistent with adenoma. KIDNEYS, URETERS AND BLADDER: No stones in the kidneys or ureters. No hydronephrosis. No perinephric or periureteral stranding. Urinary bladder is unremarkable. GI AND BOWEL: Gastric bypass surgery. Stomach demonstrates no acute abnormality. There is no bowel obstruction. PERITONEUM AND RETROPERITONEUM: No ascites. No free air. VASCULATURE: Aorta is normal in caliber. LYMPH NODES: No lymphadenopathy. REPRODUCTIVE ORGANS: Prior hysterectomy. No pelvic mass or other significant abnormality identified. BONES AND SOFT TISSUES: Severe lumbar spine degenerative changes again seen. Sacral neurostimulator device again noted. No acute osseous abnormality. No focal soft tissue abnormality. IMPRESSION: 1. No nephrolithiasis, obstructive uropathy, or other acute findings. Electronically signed by: Norleen Kil MD 05/11/2024 07:08 AM EDT RP Workstation: [id number]   Assessment & Plan:   # Iron  deficiency/secondary to gastric bypass/malabsorption: Symptomatic anemia hemoglobin ferritin 12 saturation 5% hemoglobin 10 [November 2021].   # She last received Feraheme  in May 2025.  Today hemoglobin is 12.1.  Improved.  Ferritin and iron  studies are pending at time of visit.  In August 2025 ferritin was 2782, iron  sat 9%.  Recommend holding IV iron  today  # Rectal bleeding- colonoscopy 2015 w/ Dr Jinny showed internal hemorrhoids. With his departure and patient being unable to see [facility], plan to refer to [facility] for evaluation and management. If negative Gi workup, recommend gynecologic evaluation.   # Hx of esophageal thickening- ? Stricture (aug 2021)- EGD with Dr Jinny 02/2023 was benign appearing esophageal stenosis. Dilated. Hx of gastric bypass.    #  Dyspnea- Weight gain/bloated/ poor appetite- Sec to CHF vs others. [Dr.End]-on Torsemide - follow up with heart failure clinic.   # Amputation- LLE d/t complication of diabetes.    # Hypokalemia- 3.5-continue K supplementation.    # DM- poor controlled BG 325- Per patient, due to recent steroids. Encouraged weight loss and dietary discretion. She is followed by endocrinology.    # B12 deficiency secondary gastric bypass at home iron  injections- stable    # DISPOSITION: Hold feraheme  today 4 mo- lab (cbc, cmp, b12, ferritin, iron  studies), Dr Rennie, +/- feraheme - la  No problem-specific Assessment & Plan notes found for this encounter.  All questions were answered. The patient knows to call the clinic with any problems, questions or concerns.   Tinnie KANDICE Dawn, NP 05/19/2024

## 2024-05-20 DIAGNOSIS — R635 Abnormal weight gain: Secondary | ICD-10-CM | POA: Diagnosis not present

## 2024-05-21 ENCOUNTER — Ambulatory Visit (INDEPENDENT_AMBULATORY_CARE_PROVIDER_SITE_OTHER): Admitting: Physician Assistant

## 2024-05-21 VITALS — BP 112/75 | HR 85

## 2024-05-21 DIAGNOSIS — N3946 Mixed incontinence: Secondary | ICD-10-CM

## 2024-05-21 DIAGNOSIS — L03116 Cellulitis of left lower limb: Secondary | ICD-10-CM | POA: Diagnosis not present

## 2024-05-21 DIAGNOSIS — E119 Type 2 diabetes mellitus without complications: Secondary | ICD-10-CM | POA: Diagnosis not present

## 2024-05-21 DIAGNOSIS — M1009 Idiopathic gout, multiple sites: Secondary | ICD-10-CM | POA: Diagnosis not present

## 2024-05-21 DIAGNOSIS — E039 Hypothyroidism, unspecified: Secondary | ICD-10-CM | POA: Diagnosis not present

## 2024-05-21 NOTE — Progress Notes (Signed)
 PTNS  Session # 11  Health & Social Factors: no change Caffeine : 4 Alcohol : 0 Daytime voids #per day: 6-7 Night-time voids #per night: 2 Urgency: severe Incontinence Episodes #per day: 9 Ankle used: right Treatment Setting: 19 Feeling/ Response: sensory Comments: Patient tolerated well. She has #12 next week but has previously scheduled follow up with Dr. Gaston prior. Will keep existing appointments due to availability and anticipated transportation issues once she discharges from rehab later this month.  Performed By: Jerusalem Wert, PA-C   Follow Up: 1 week

## 2024-05-25 ENCOUNTER — Ambulatory Visit: Admitting: Urology

## 2024-05-25 DIAGNOSIS — N3946 Mixed incontinence: Secondary | ICD-10-CM

## 2024-05-25 DIAGNOSIS — E1140 Type 2 diabetes mellitus with diabetic neuropathy, unspecified: Secondary | ICD-10-CM | POA: Diagnosis not present

## 2024-05-26 ENCOUNTER — Telehealth: Payer: Self-pay | Admitting: Family

## 2024-05-26 NOTE — Progress Notes (Unsigned)
 Advanced Heart Failure Clinic Note    PCP: Marylynn Verneita CROME, MD (last seen 03/25) Cardiologist: Lonni Hanson, MD / Franchester Mail, PA (last seen 04/25) HF provider: Gardenia Led, MD (last seen 12/24)  Chief Complaint: HFpEF  HPI:  Denise Sharp is a 78 y/o female with a history of HFpEF, CAD status post PCI to the LAD, persistent atrial fibrillation, type 2 diabetes, CKD and morbid obesity presenting today for follow up of chronic diastolic heart failure.  Echo 03/25/12: EF 55-65% with mild AR Echo 08/21/17: EF 65-70% with Grade II DD and mildly elevated PA pressure of 33 mmHg Echo 12/30/19: EF 65-70% with Grade II DD  RHC/ LHC 06/04/22: Mild-moderate, non-obstructive coronary artery disease, similar to prior catheterization in 08/2020. Widely patent ostial LAD stent. Patent mid LAD stent with minimal in stent restenosis (~10% narrowing). Upper normal left and right heart filling pressures. Borderline elevated pulmonary artery pressure. Normal to mildly reduced cardiac output/index.  Admitted 03/28/23 due to 30 pound weight gain requiring IV diuresis.  At that time she was taking Lasix  20 mg once daily to once every other day. She was admitted to [facility] where echocardiogram demonstrated preserved EF with grade 2 diastolic dysfunction. She was diuresed 4 L through admission and transition to torsemide  20 mg daily at discharge. Throughout admission she maintained normal sinus rhythm. She did have rectal bleeding that was secondary to hemorrhoids.  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     There were no vitals filed for this visit.  Wt Readings from Last 3 Encounters:  05/11/24 211 lb 9.6 oz (96 kg)  04/07/24 230 lb (104.3 kg)  03/31/24 230 lb (104.3 kg)   Lab Results  Component Value Date   CREATININE 1.44 (H) 05/19/2024   CREATININE 1.36 (H) 03/15/2024   CREATININE 1.63 (H) 03/13/2024    PHYSICAL EXAM: There were no vitals filed for this  visit.  GENERAL: NAD Lungs- CTA CARDIAC:  JVP: 7 cm          Normal rate with regular rhythm. no murmur.  Pulses 2+. no edema.  ABDOMEN: Soft, non-tender, non-distended.  EXTREMITIES: Warm and well perfused.  NEUROLOGIC: No obvious FND   ECG: Not done   ASSESSMENT & PLAN:  1: NICM with preserved ejection fraction- - suspect due to obesity, atrial fibrillation, vascular disease, hypertension, CKD - NYHA class III: symptoms 2/2 deconditioning as well - appear near euvolemic - Echo 08/21/17: EF 65-70% with Grade II DD and mildly elevated PA pressure of 33 mmHg - Echo 12/30/19: EF 65-70% with Grade II DD - Echo 03/29/23: EF 60-65% with mild LVH, Grade II DD - continue farxiga  10mg  daily - continue finerenone  10mg  daily - Currently reports taking torsemide  40mg  daily; would like to reduce dose. We will come down to 20mg  daily. Extensively counseled on decreased fluid intake.  - continue potassium 20mEq daily  2: HTN- - BP 123/59 - saw PCP Denise Sharp) 03/25 - BMET 5/15 reviewed, elevated glucose, otherwise creatinine stable, BUN improving - reviewed CMP from 01/24/24: sCr 1.30; stable.   3: PAF- - saw cardiology Denise Sharp) 04/25 - continue apixaban  5mg  BID.   4: OSA- - had video visit with pulmonology Denise Sharp) 10/24 - wearing CPAP nightly  5: Left leg edema- - started on allopurinol  now; following with rheumatology. CT scan pending.   6: Iron  deficiency anemia- - saw hematology Denise Sharp) 09/24 - hemoglobin 04/08/23 was 14.2  7: DM- - saw endocrinology Denise Sharp) 03/25 - A1c  09/30/23 was 9.3% - patient says that glucose is continuing to run high  8. CKD IIIB - With underlying T2DM, HFpEF will continue finerenone  10mg .  - saw nephrology (Denise Sharp) 03/25  9. Deconditioning - she did not qualify for pulmonary rehab due to multiple orthopaedic issues along with severe incontinence - not doing any PT yet  I spent 30 minutes caring for this patient today including face to face  time, ordering and reviewing labs, reviewing records/labs noted above, seeing the patient, documenting in the record, and arranging follow ups.   Aditya Sabharwal 4:36 PM

## 2024-05-26 NOTE — Telephone Encounter (Signed)
 Called to confirm/remind patient of their appointment at the Advanced Heart Failure Clinic on 05/27/24.   Appointment:   [x] Confirmed  [] Left mess   [] No answer/No voice mail  [] VM Full/unable to leave message  [] Phone not in service  Patient reminded to bring all medications and/or complete list.  Confirmed patient has transportation. Gave directions, instructed to utilize valet parking.

## 2024-05-27 ENCOUNTER — Ambulatory Visit: Attending: Family | Admitting: Family

## 2024-05-27 ENCOUNTER — Encounter: Payer: Self-pay | Admitting: Family

## 2024-05-27 VITALS — BP 108/56 | HR 80 | Wt 219.4 lb

## 2024-05-27 DIAGNOSIS — I5032 Chronic diastolic (congestive) heart failure: Secondary | ICD-10-CM | POA: Insufficient documentation

## 2024-05-27 DIAGNOSIS — I4819 Other persistent atrial fibrillation: Secondary | ICD-10-CM | POA: Diagnosis not present

## 2024-05-27 DIAGNOSIS — N183 Chronic kidney disease, stage 3 unspecified: Secondary | ICD-10-CM | POA: Diagnosis not present

## 2024-05-27 DIAGNOSIS — R5381 Other malaise: Secondary | ICD-10-CM | POA: Diagnosis not present

## 2024-05-27 DIAGNOSIS — E1122 Type 2 diabetes mellitus with diabetic chronic kidney disease: Secondary | ICD-10-CM | POA: Diagnosis not present

## 2024-05-27 DIAGNOSIS — I10 Essential (primary) hypertension: Secondary | ICD-10-CM

## 2024-05-27 DIAGNOSIS — R0789 Other chest pain: Secondary | ICD-10-CM | POA: Insufficient documentation

## 2024-05-27 DIAGNOSIS — Z955 Presence of coronary angioplasty implant and graft: Secondary | ICD-10-CM | POA: Diagnosis not present

## 2024-05-27 DIAGNOSIS — D631 Anemia in chronic kidney disease: Secondary | ICD-10-CM | POA: Diagnosis not present

## 2024-05-27 DIAGNOSIS — D509 Iron deficiency anemia, unspecified: Secondary | ICD-10-CM | POA: Diagnosis not present

## 2024-05-27 DIAGNOSIS — I130 Hypertensive heart and chronic kidney disease with heart failure and stage 1 through stage 4 chronic kidney disease, or unspecified chronic kidney disease: Secondary | ICD-10-CM | POA: Diagnosis not present

## 2024-05-27 DIAGNOSIS — I2510 Atherosclerotic heart disease of native coronary artery without angina pectoris: Secondary | ICD-10-CM | POA: Insufficient documentation

## 2024-05-27 DIAGNOSIS — Z9181 History of falling: Secondary | ICD-10-CM | POA: Insufficient documentation

## 2024-05-27 DIAGNOSIS — G4733 Obstructive sleep apnea (adult) (pediatric): Secondary | ICD-10-CM | POA: Diagnosis not present

## 2024-05-27 DIAGNOSIS — E1142 Type 2 diabetes mellitus with diabetic polyneuropathy: Secondary | ICD-10-CM

## 2024-05-27 DIAGNOSIS — N1832 Chronic kidney disease, stage 3b: Secondary | ICD-10-CM | POA: Diagnosis not present

## 2024-05-27 DIAGNOSIS — Z89612 Acquired absence of left leg above knee: Secondary | ICD-10-CM | POA: Diagnosis not present

## 2024-05-27 DIAGNOSIS — R32 Unspecified urinary incontinence: Secondary | ICD-10-CM | POA: Diagnosis not present

## 2024-05-27 DIAGNOSIS — Z7901 Long term (current) use of anticoagulants: Secondary | ICD-10-CM | POA: Insufficient documentation

## 2024-05-27 DIAGNOSIS — I428 Other cardiomyopathies: Secondary | ICD-10-CM | POA: Insufficient documentation

## 2024-05-27 DIAGNOSIS — R159 Full incontinence of feces: Secondary | ICD-10-CM | POA: Insufficient documentation

## 2024-05-27 DIAGNOSIS — Z79899 Other long term (current) drug therapy: Secondary | ICD-10-CM | POA: Diagnosis not present

## 2024-05-27 DIAGNOSIS — E1151 Type 2 diabetes mellitus with diabetic peripheral angiopathy without gangrene: Secondary | ICD-10-CM | POA: Insufficient documentation

## 2024-05-27 DIAGNOSIS — I480 Paroxysmal atrial fibrillation: Secondary | ICD-10-CM | POA: Diagnosis not present

## 2024-05-27 NOTE — Patient Instructions (Signed)
 Medication Changes:  No medication changes today!   Referrals:  You have been referred to social work. They will contact you to further try to further assist you  Follow-Up in: Please follow up with the Advanced Heart Failure Clinic in 3 months with Denise Class, FNP.   Thank you for choosing [facility] Advanced [facility] Failure [facility].    At the Advanced Heart Failure Clinic, you and your health needs are our priority. We have a designated team specialized in the treatment of Heart Failure. This Care Team includes your primary Heart Failure Specialized Cardiologist (physician), Advanced Practice Providers (APPs- Physician Assistants and Nurse Practitioners), and Pharmacist who all work together to provide you with the care you need, when you need it.   You may see any of the following providers on your designated Care Team at your next follow up:  Dr. Toribio Fuel Dr. Ezra Shuck Dr. Ria Commander Dr. Morene Brownie Denise Class, FNP Jaun Bash, RPH-CPP  Please be sure to bring in all your medications bottles to every appointment.   Need to Contact Us :  If you have any questions or concerns before your next appointment please send us  a message through [place] or call our office at [phone].    TO LEAVE A MESSAGE FOR THE NURSE SELECT OPTION 2, PLEASE LEAVE A MESSAGE INCLUDING: YOUR NAME DATE OF BIRTH CALL BACK NUMBER REASON FOR CALL**this is important as we prioritize the call backs  YOU WILL RECEIVE A CALL BACK THE SAME DAY AS LONG AS YOU CALL BEFORE 4:00 PM

## 2024-05-28 ENCOUNTER — Ambulatory Visit (INDEPENDENT_AMBULATORY_CARE_PROVIDER_SITE_OTHER): Admitting: Physician Assistant

## 2024-05-28 VITALS — BP 122/71 | HR 87

## 2024-05-28 DIAGNOSIS — N3946 Mixed incontinence: Secondary | ICD-10-CM

## 2024-05-28 NOTE — Progress Notes (Signed)
 PTNS  Session # 12  Health & Social Factors: no change Caffeine : 3 Alcohol : 0 Daytime voids #per day: 6-8 Night-time voids #per night: 2 Urgency: severe Incontinence Episodes #per day: all Ankle used: right Treatment Setting: 14 Feeling/ Response: sensory Comments: Patient tolerated well.  Performed By: Tarena Gockley, PA-C   Follow Up: 1 month with Dr. MacDiarmid

## 2024-06-01 DIAGNOSIS — E113393 Type 2 diabetes mellitus with moderate nonproliferative diabetic retinopathy without macular edema, bilateral: Secondary | ICD-10-CM | POA: Diagnosis not present

## 2024-06-02 DIAGNOSIS — R5381 Other malaise: Secondary | ICD-10-CM | POA: Diagnosis not present

## 2024-06-02 DIAGNOSIS — L03116 Cellulitis of left lower limb: Secondary | ICD-10-CM | POA: Diagnosis not present

## 2024-06-02 DIAGNOSIS — E1140 Type 2 diabetes mellitus with diabetic neuropathy, unspecified: Secondary | ICD-10-CM | POA: Diagnosis not present

## 2024-06-03 ENCOUNTER — Telehealth: Payer: Self-pay

## 2024-06-03 NOTE — Progress Notes (Signed)
 Complex Care Management Note  Care Guide Note 06/03/2024 Name: Denise Sharp MRN: [id number] DOB: [birth year]  Denise Sharp is a 78 y.o. year old female who sees Marylynn, Verneita CROME, MD for primary care. I reached out to Rock JONETTA Gander by phone today to offer complex care management services.  [name] was given information about Complex Care Management services today including:   The Complex Care Management services include support from the care team which includes your Nurse Care Manager, Clinical Social Worker, or Pharmacist.  The Complex Care Management team is here to help remove barriers to the health concerns and goals most important to you. Complex Care Management services are voluntary, and the patient may decline or stop services at any time by request to their care team member.   Complex Care Management Consent Status: Patient agreed to services and verbal consent obtained.   Follow up plan:  Telephone appointment with complex care management team member scheduled for:  06/04/24 & 06/17/24.  Encounter Outcome:  Patient Scheduled  Dreama [facility]  [facility], [facility] VBCI Assistant Direct Dial: [phone]  Fax: [phone]

## 2024-06-04 ENCOUNTER — Other Ambulatory Visit: Payer: Self-pay

## 2024-06-04 DIAGNOSIS — E119 Type 2 diabetes mellitus without complications: Secondary | ICD-10-CM | POA: Diagnosis not present

## 2024-06-04 DIAGNOSIS — I959 Hypotension, unspecified: Secondary | ICD-10-CM | POA: Diagnosis not present

## 2024-06-04 DIAGNOSIS — M6281 Muscle weakness (generalized): Secondary | ICD-10-CM | POA: Diagnosis not present

## 2024-06-04 DIAGNOSIS — M86172 Other acute osteomyelitis, left ankle and foot: Secondary | ICD-10-CM | POA: Diagnosis not present

## 2024-06-04 NOTE — Patient Outreach (Signed)
 Social Drivers of Health  Community Resource and Care Coordination Visit Note   06/04/2024  Name: Denise Sharp MRN: [id number] DOB:04-16-1946  Situation: Referral received for [facility] needs assessment and assistance related to Level of Care. I obtained verbal consent from Patient.  Visit completed with Patient on the phone.   Background:   SDOH Interventions Today    Flowsheet Row Most Recent Value  SDOH Interventions   Food Insecurity Interventions Intervention Not Indicated  Housing Interventions Intervention Not Indicated  Transportation Interventions Other (Comment)  [Due to recent amputation, can no longer get into vehicle and can't afford the cost of a lift on her fleeta. Has access to transportation once out of rehab.]  Utilities Interventions Intervention Not Indicated  Financial Strain Interventions Intervention Not Indicated     Assessment:   Goals Addressed             This Visit's Progress    BSW Goals       Current SDOH Barriers:  Level of care concerns Discharge plan from rehab  Interventions: Patient interviewed and appropriate screenings performed Provided patient with information about Medicaid and Long Term Placement Discussed plans with patient for ongoing follow up and provided patient with direct contact number Advised patient to speak to SW Jerel Garret, apply for Medicaid, look for Long Term Placement facilities Collaborated with [employer] (community agency) re: left message with Jerel Garret to assist with discharge planning information and options for long term placement. Patient reports her husband has Prostate and Bone Cancer, Agent Orange and breathing issues.  He is not able to lift anything over 1 gallon.  He is not able to provide care. Insurance does not cover Home Health and would have to pay $11,000 a month for her care and patient can not afford the cost.  Patient has a ramp and a manual wheelchair was delivered today.             Recommendation:   Patient to communicate with SW Jerel Garret at [facility] on discharge plan.  Follow Up Plan:   Telephone follow up appointment date/time:  06/05/24 at 9:30am  Denise Sharp, BSW [place]  [facility], [facility] Social Worker Direct Dial: [phone]  Fax: [phone] Website: [email or web address]

## 2024-06-04 NOTE — Patient Instructions (Signed)
 Visit Information  Thank you for taking time to visit with me today. Please don't hesitate to contact me if I can be of assistance to you before our next scheduled appointment.  Our next appointment is by telephone on 06/05/24 at 9:30am Please call the care guide team at [phone] if you need to cancel or reschedule your appointment.   Following is a copy of your care plan:   Goals Addressed             This Visit's Progress    BSW Goals       Current SDOH Barriers:  Level of care concerns Discharge plan from rehab  Interventions: Patient interviewed and appropriate screenings performed Provided patient with information about Medicaid and Long Term Placement Discussed plans with patient for ongoing follow up and provided patient with direct contact number Advised patient to speak to SW Jerel Garret, apply for Medicaid, look for Long Term Placement facilities Collaborated with [employer] (community agency) re: left message with Jerel Garret to assist with discharge planning information and options for long term placement. Patient reports her husband has Prostate and Bone Cancer, Agent Orange and breathing issues.  He is not able to lift anything over 1 gallon.  He is not able to provide care. Insurance does not cover Home Health and would have to pay $11,000 a month for her care and patient can not afford the cost.  Patient has a ramp and a manual wheelchair was delivered today.            Please call 911 if you are experiencing a Mental Health or Behavioral Health Crisis or need someone to talk to.  Patient verbalized understanding of Care plan and visit instructions communicated this visit  Tillman Gardener, BSW [place]  [facility], [facility] Social Worker Direct Dial: [phone]  Fax: [phone] Website: [email or web address]

## 2024-06-05 ENCOUNTER — Other Ambulatory Visit: Payer: Self-pay

## 2024-06-05 NOTE — Patient Outreach (Signed)
 Social Drivers of Health  Community Resource and Care Coordination Visit Note   06/05/2024  Name: Denise Sharp MRN: [id number] DOB:April 01, 1946  Situation: Referral received for [facility] needs assessment and assistance related to Level of Care. I obtained verbal consent from Patient.  Visit completed with Patient on the phone.   Background:      Assessment:   Goals Addressed             This Visit's Progress    BSW Goals       Current SDOH Barriers:  Level of care concerns Discharge plan from rehab  Interventions: Patient interviewed and appropriate screenings performed Provided patient with information about Medicaid and Long Term Placement and [facility] staff will assist with application. Discussed plans with patient for ongoing follow up and provided patient with direct contact number Advised patient to speak to SW Jerel Garret, apply for Medicaid, look for Long Term Placement facilities Collaborated with [employer] (community agency) re: SW spoke to SW [place] and [facility] has been set up for PT/OT/Bath Nurse. Patient will be provided a list of Home Health agencies for private pay.  Referral has been made to [place] for [place] support for personal care and other benefits. Patients daughter has been involved over the past 2 weeks with discharge planning after a meeting with patient, husband and daughter.  SW Garret reports patient declined Medicaid and Long Term placement in the past. Patients last stay was yesterday and would have to either be pending or have to pay while Medicaid is pending.  Patient plans to return home if she is not a candidate for Medicaid and obtain private duty Home Health.  SW staffed case with RN Selina Duos.          Recommendation:   Patient will work with facility on discharge plan, apply for Medicaid and if Medicaid is not an options, return home and contact Home Health agencies for private pay rates.  Follow Up Plan:    Telephone follow up appointment date/time:  06/10/24 at 3pm  Tillman Gardener, BSW [place]  [facility], [facility] Social Worker Direct Dial: [phone]  Fax: [phone] Website: [email or web address]

## 2024-06-05 NOTE — Patient Instructions (Signed)
 Visit Information  Thank you for taking time to visit with me today. Please don't hesitate to contact me if I can be of assistance to you before our next scheduled appointment.  Your next care management appointment is by telephone on 06/10/24 at 3pm  Please call the care guide team at [phone] if you need to cancel, schedule, or reschedule an appointment.   Please call 911 if you are experiencing a Mental Health or Behavioral Health Crisis or need someone to talk to.  Tillman Gardener, BSW [place]  [facility], [facility] Social Worker Direct Dial: [phone]  Fax: [phone] Website: [email or web address]

## 2024-06-10 ENCOUNTER — Other Ambulatory Visit: Payer: Self-pay

## 2024-06-10 NOTE — Patient Outreach (Signed)
 Social Drivers of Health  Community Resource and Care Coordination Visit Note   06/10/2024  Name: LOIDA CALAMIA MRN: [id number] DOB:[birth year]  Situation: Referral received for [facility] needs assessment and assistance related to Level of care. I obtained verbal consent from Patient.  Visit completed with Patient on the phone.   Background:      Assessment:   Goals Addressed             This Visit's Progress    COMPLETED: BSW Goals       Current SDOH Barriers:  Level of care concerns Discharge plan from rehab  Interventions: Patient has returned home. Patient is not eligible for Medicaid Patient has a list of Home Health Agencies and Transportation services  Patient will review resources to see what she can afford Patient is receiving OT/PT/Nurse visits          Recommendation:   Patient will follow up with Transportation and Home Health Agencies for private pay rates.    Follow Up Plan:   Patient has achieved all patient stated goals. [employer] will be closed. Patient has been provided contact information should new needs arise.   Tillman Gardener, BSW [place]  [facility], [facility] Social Worker Direct Dial: [phone]  Fax: [phone] Website: [email or web address]

## 2024-06-10 NOTE — Patient Instructions (Signed)

## 2024-06-17 ENCOUNTER — Ambulatory Visit: Payer: Self-pay

## 2024-06-17 ENCOUNTER — Other Ambulatory Visit: Payer: Self-pay

## 2024-06-17 DIAGNOSIS — Z789 Other specified health status: Secondary | ICD-10-CM

## 2024-06-17 NOTE — Telephone Encounter (Signed)
 FYI Only or Action Required?: FYI only for provider: appointment scheduled on 06/18/24.  Patient was last seen in primary care on 02/26/2024 by Gretel App, NP.  Called Nurse Triage reporting Eye infection.  Symptoms began several days ago.  Interventions attempted: Nothing.  Symptoms are: gradually worsening.  Triage Disposition: Call PCP Within 24 Hours  Patient/caregiver understands and will follow disposition?: Yes Reason for Disposition  [1] Bacterial conjunctivitis suspected (e.g., white, yellow or green discharge nearly all day long; or eyelids stuck shut from discharge after sleep) AND [2] NO doctor standing order to call in antibiotic eye drops  (Exception: [place]; continue triage.)  Answer Assessment - Initial Assessment Questions 1. EYE DISCHARGE: Is the discharge in one or both eyes? What color is it? How much is there? When did the discharge start?      Both eyes, white and crusty  2. REDNESS OF SCLERA: Is there redness in the white of the eye? If Yes, ask: Is it in one or both eyes? When did the redness start?     Blood shot  3. EYELIDS: Are the eyelids red or swollen? If Yes, ask: How much?      Yes  4. VISION: Do you have blurred vision?     Yes  5. PAIN: Is there any pain? If Yes, ask: How bad is the pain? (Scale 0-10; or none, mild, moderate, severe)     5/10 when touching them  6. OTHER SYMPTOMS: Do you have any other symptoms? (e.g., fever, runny nose, cough)     Denies  Protocols used: Eye - Pus or Discharge-A-AH  Copied from CRM #[id number]. Topic: Clinical - Red Word Triage >> Jun 17, 2024  3:27 PM Alfonso HERO wrote: Red Word that prompted transfer to Nurse Triage: infection in eye but needs a sooner appt

## 2024-06-17 NOTE — Patient Outreach (Signed)
 Complex Care Management   Visit Note  06/17/2024  Name:  Denise Sharp MRN: [id number] DOB: Jan 31, 1946  Situation: Referral received for Complex Care Management related to Diabetes with Complications I obtained verbal consent from Patient.  Visit completed with Patient  on the phone  Background:   Past Medical History:  Diagnosis Date   (HFpEF) heart failure with preserved ejection fraction (HCC)    a. 12/2019 Echo: EF 65-70%, Gr2 DD. No significant valvular dzs.   Abnormality of gait 03/25/2013   Adrenal mass, left    Anginal pain    Anxiety    Aortic atherosclerosis    Arthritis    Asthma    CAD (coronary artery disease) with h/o Atypical Chest Pain    a. 04/1986 Cath ([facility]): nl cors, EF 65%; b. 07/2012 Cath [facility]): Diff minor irregs; c. 07/2016 [facility]): Equivocal; d. 08/2016 Cardiac CT Ca2+ score [facility]): Ca2+ 1548; e. 05/2019 [facility]: No ischemia. EF 75%; f. 12/2019 PCI: LM nl, LAD 65ost (3.5x12 Resolute DES), 78m (2.75x12 Resolute DES),LCX/RCA nl; g. 08/2020 Cath: patent LAD stents, RCA 40ost, elev RH pressures, EF >65%.   Cervical spinal stenosis 1994   due to trauma to back ([employer] accident), has intermittent paralysis and parasthesias   Cervicogenic headache 03/23/2014   CKD (chronic kidney disease), stage III (HCC)    Depression    Diverticulosis    Dizziness    a.) chronic   DJD (degenerative joint disease)    a. Chronic R shoulder pain   Dyspnea    Esophageal stenosis 09/2009   a.) transient outlet obstruction by food, cleared by EGD   Family history of adverse reaction to anesthesia    a.) daughter with (+) PONV   Gastric bypass status for obesity    GERD (gastroesophageal reflux disease)    Gout    Headache(784.0)    HLD (hyperlipidemia)    Hypertension    IBS (irritable bowel syndrome)    IDA (iron  deficiency anemia)    a.) post 2 unit txfsn 2009, normal endo/colonoscopy by [facility]   Left bundle branch block (LBBB)    a.) Intermittently present - likely rate  related.   NSVT (nonsustained ventricular tachycardia) (HCC) 11/15/2020   a.) Holter study 11/15/2020; 11 episodes of NSVT lasting up to 8 beats with a maximum rate of 210 bpm; 14 atrial runs lasting up to 18 beats with a rate of up to 250 bpm.  Some atrial runs felt to be NSVT.   NSVT (nonsustained ventricular tachycardia) (HCC)    a. 10/2020 [facility]: 11 runs of NSVT up to 8 beats.   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 10/2020 [facility]: 14 atrial runs up to 18 beats, max HR 250.   Restless leg syndrome    Rotator cuff arthropathy, right 08/13/2017   Syncope and collapse 03/12/2014   Type II diabetes mellitus (HCC)     Assessment: Patient Reported Symptoms:  Cognitive Cognitive Status: Alert and oriented to person, place, and time, Insightful and able to interpret abstract concepts, Normal speech and language skills Cognitive/Intellectual Conditions Management [RPT]: None reported or documented in medical history or problem list   Health Maintenance Behaviors: Annual physical exam Healing Pattern: Slow  Neurological Neurological Review of Symptoms: Other: [place] Neurological Symptoms/Conditions [RPT]: patient reports neuropathy right foot, phantom pain left BKA Neurological Management Strategies:  Medication therapy, Routine screening  HEENT HEENT Symptoms Reported: Eye discharge HEENT Comment: patient reports approx 5 days eye crusting upon waking, drainage, redness and dicomfort - stated will call provider after visit as advised and not wait for appointment on Friday    Cardiovascular Cardiovascular Symptoms Reported: Not assessed Cardiovascular Comment: patient stated torsemide  was discontinued, no further issues with heart failure - made aware referred for HF  Respiratory Respiratory Symptoms Reported: Not assesed    Endocrine Endocrine  Symptoms Reported: Hyperglycemia Is patient diabetic?: Yes Is patient checking blood sugars at home?: Yes List most recent blood sugar readings, include date and time of day: CGM, reported BG prior to call 300, reported lows at rehab, none at home,  message sent ot PCP requesting Libre 3+ refill,    Gastrointestinal Gastrointestinal Symptoms Reported: Diarrhea, Incontinence, Nausea Additional Gastrointestinal Details: immodium prn Gastrointestinal Management Strategies: Medication therapy, Incontinence garment/pad    Genitourinary Genitourinary Symptoms Reported: Incontinence Genitourinary Management Strategies: Incontinence garment/pad  Integumentary Additional Integumentary Details: 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 states small area remains open, no drainage, dry dressing    Musculoskeletal Musculoskelatal Symptoms Reviewed: Joint pain, Muscle pain, Weakness, Other, Back pain Other Musculoskeletal Symptoms: nonambulatory - wheelchair/bed Additional Musculoskeletal Details: L BKA, pain and phantom pain, right leg, unable to bear weight, worried if cannot support weight on right will not be able to get prosthesis, uses transfer board from hospital bed to wheelchair, reports wheelchair prevents access to multiple rooms due to small doorways, needs ramp for vehicle, transportation out to home at end of visit to evaluate for services Musculoskeletal Management Strategies: Medication therapy, Routine screening, Medical device Musculoskeletal Comment: [facility] - plan for PT 2xweek/6 Denise Sharp, OT 1xweek/4 Denise Sharp, Nurse 2xweek/6 Denise Sharp, Bath Aide 1xweek for 4-6 Denise Sharp. States medicare will not pay anything else for her, does not qualify for Medicaid, interest in [employer] should cover  - given number off scanned insurance card as requested Falls in the past year?: Yes Number of falls in past year: 1 or less Was there an injury with Fall?: Yes (patient repots fll in rehab with injury to ribs) Fall  Risk Category Calculator: 2 Patient Fall Risk Level: Moderate Fall Risk Patient at Risk for Falls Due to: History of fall(s), Impaired mobility, Orthopedic patient Fall risk Follow up: Falls evaluation completed  Psychosocial Psychosocial Symptoms Reported: Anxiety - if selected complete GAD, Depression - if selected complete PHQ 2-9 Additional Psychological Details: reports depression related to her health, life not what she was expecting, patient very talkative, focused on telling story, particularly related to needs now that home,  concerns with future care and issues with nursing home, support provided, multiple attempts to redirect. Unable to complete entire assessment and aware need to complete at next visit. Assessment info obtained as patient shared story. Behavioral Management Strategies: Support system (reports family very supportive) Behavioral Health Comment: Referral to LCSW and to Palliative Care Major Change/Loss/Stressor/Fears (CP): Medical condition, family, Medical condition, self, Environment, Resources, Traumatic event (patient sttes she thought left foot leg were improving, ended up with amputation with very little advance notice - overwhelming) Quality of Family Relationships: helpful, involved Do you feel physically threatened by others?: No    06/17/2024    PHQ2-9 Depression Screening   Little interest or pleasure in doing things    Feeling down, depressed, or hopeless    PHQ-2 - Total Score    Trouble falling or staying asleep, or sleeping too much    Feeling tired or  having little energy    Poor appetite or overeating     Feeling bad about yourself - or that you are a failure or have let yourself or your family down    Trouble concentrating on things, such as reading the newspaper or watching television    Moving or speaking so slowly that other people could have noticed.  Or the opposite - being so fidgety or restless that you have been moving around a lot more than  usual    Thoughts that you would be better off dead, or hurting yourself in some way    PHQ2-9 Total Score    If you checked off any problems, how difficult have these problems made it for you to do your work, take care of things at home, or get along with other people    Depression Interventions/Treatment      There were no vitals filed for this visit.    Medications Reviewed Today     Reviewed by Devra Lands, RN (Registered Nurse) on 06/17/24 at 1450  Med List Status: <None>   Medication Order Taking? Sig Documenting Provider Last Dose Status Informant  acetaminophen  (TYLENOL ) 325 MG tablet [id number] Yes Take 2 tablets (650 mg total) by mouth every 6 (six) hours as needed for mild pain (pain score 1-3). Jens Durand, MD  Active   allopurinol  (ZYLOPRIM ) 100 MG tablet [id number] Yes Take 100 mg by mouth daily. [provider]  Active   apixaban  (ELIQUIS ) 5 MG TABS tablet [id number] Yes Take 5 mg by mouth 2 (two) times daily. [provider]  Active   atorvastatin  (LIPITOR ) 80 MG tablet [id number] Yes Take 80 mg by mouth daily. [provider]  Active   budesonide -formoterol  (SYMBICORT ) 160-4.5 MCG/ACT inhaler [id number] Yes Inhale 2 puffs into the lungs 2 (two) times daily. [provider]  Active   cetirizine (ZYRTEC) 10 MG tablet [id number] Yes Take 10 mg by mouth daily. [provider]  Active   Cholecalciferol  (VITAMIN D3) 1.25 MG (50000 UT) CAPS [id number] Yes Take 5,000 Units by mouth daily. [provider]  Active   clotrimazole -betamethasone  (LOTRISONE ) cream [id number]  Apply 1 application topically 2 (two) times daily as needed (irritation).  Patient not taking: Reported on 06/17/2024   [provider]  Active Pharmacy Records  colchicine  0.6 MG tablet [id number] Yes Take 0.6 mg by mouth daily. [provider]  Active   conjugated estrogens  (PREMARIN ) vaginal cream [id number] Yes Apply one pea-sized amount around  the opening of the urethra daily for 2 Lorren Splawn, then 3 times weekly moving forward. Vaillancourt, Samantha, PA-C  Active Pharmacy Records  Continuous Glucose Sensor (FREESTYLE LIBRE 3 PLUS SENSOR) [place] [id number] Yes Change sensor every 15 days. Tullo, Teresa L, MD  Active   dicyclomine  (BENTYL ) 20 MG tablet [id number] Yes Take 1 tablet (20 mg total) by mouth 3 (three) times daily before meals. Marylynn Verneita CROME, MD  Active Pharmacy Records  DULoxetine  (CYMBALTA ) 60 MG capsule [id number] Yes Take 60 mg by mouth daily. [provider]  Active   ezetimibe  (ZETIA ) 10 MG tablet [id number] Yes Take 10 mg by mouth daily.  Patient taking differently: Take 10 mg by mouth daily. Unsure if taking   [provider]  Active   Finerenone  (KERENDIA ) 10 MG TABS [id number] Yes Take 1 tablet (10 mg total) by mouth in the morning. [place] A, FNP  Active   fluconazole  (DIFLUCAN ) 100 MG tablet [id number]  Take 1 tablet (100 mg total) by  mouth daily. X 7 days  Patient not taking: Reported on 06/17/2024   Helon Kirsch A, PA-C  Active   fluticasone  (FLONASE ) 50 MCG/ACT nasal spray [id number]  Place 2 sprays into both nostrils daily.  Patient not taking: Reported on 06/17/2024   Maribeth Camellia MATSU, MD  Active Pharmacy Records  Glucagon  (GVOKE HYPOPEN  2-PACK) 0.5 MG/0.1ML EMMANUEL [id number] Yes Inject 1 auto injector subcutaneously as needed for severe hypoglycemia. May repeat x 1 after 15 minutes if needed. Gretel App, NP  Active Pharmacy Records  glucose blood [facility] VERIO) test strip [id number] Yes Use to check blood sugar twice daily. Marylynn Verneita CROME, MD  Active Pharmacy Records  insulin  aspart (NOVOLOG  FLEXPEN) 100 UNIT/ML FlexPen [id number] Yes Inject 8-14 Units into the skin 3 (three) times daily with meals. 90-150= 8 units; 151-200=9 units; 201-250=10 units; 251-300= 11 units; 301-350= 12 units; 351-400=13 units; 401 or higher =14 units Reardon, Benton PARAS, NP  Active   insulin  degludec (TRESIBA ) 100  UNIT/ML FlexTouch Pen [id number] Yes Inject 15 Units into the skin at bedtime. Therisa Benton PARAS, NP  Active   Insulin  Pen Needle (DROPLET PEN NEEDLES) 31G X 5 MM MISC [id number] Yes USE ONE NEEDLE SUBCUTANEOUSLY AS DIRECTED (REMOVE AND DISCARD NEEDLE IN SHARPS CONTAINER IMMEDIATELY AFTER USE) Therisa Benton PARAS, NP  Active Pharmacy Records  levothyroxine  (SYNTHROID ) 25 MCG tablet [id number] Yes Take 25 mcg by mouth daily before breakfast. [provider]  Active   lidocaine  (LIDODERM ) 5 % [id number]  Place 1 patch onto the skin daily. Remove & Discard patch within 12 hours or as directed by MD  Patient not taking: Reported on 06/17/2024   [provider]  Active   loperamide  (IMODIUM ) 2 MG capsule [id number] Yes Take 4-6 mg by mouth 3 (three) times daily before meals. [provider]  Active Pharmacy Records  midodrine (PROAMATINE) 5 MG tablet [id number] Yes Take 5 mg by mouth as needed. [provider]  Active   nitroGLYCERIN  (NITROSTAT ) 0.4 MG SL tablet [id number] Yes Place 0.4 mg under the tongue every 5 (five) minutes as needed for chest pain. [provider]  Active   nystatin  (MYCOSTATIN /NYSTOP ) powder [id number] Yes Apply 1 g topically 4 (four) times daily as needed (rash). [provider]  Active Pharmacy Records  ondansetron  (ZOFRAN ) 4 MG tablet [id number] Yes Take 4 mg by mouth every 8 (eight) hours as needed for nausea or vomiting. [provider]  Active   OneTouch Delica Lancets 33G MISC [id number] Yes Used to check blood sugar two times a day. Marylynn Verneita CROME, MD  Active Pharmacy Records  oxycodone  (OXY-IR) 5 MG capsule [id number] Yes Take 5 mg by mouth every 4 (four) hours as needed. 2.5 mg as needed every 6 hours [provider]  Active   pantoprazole  (PROTONIX ) 40 MG tablet [id number] Yes Take 40 mg by mouth daily. [provider]  Active   phenylephrine -shark liver oil-mineral oil-petrolatum (PREPARATION H)  0.25-14-74.9 % rectal ointment [id number] Yes Place 1 Application rectally 2 (two) times daily as needed for hemorrhoids (prn every 8 hours). [provider]  Active   Polyethyl Glycol-Propyl Glycol (SYSTANE OP) [id number] Yes Place 1 drop into both eyes 4 (four) times daily as needed (dry eyes). [provider]  Active Pharmacy Records  pregabalin  (LYRICA ) 50 MG capsule [id number] Yes Take 1 capsule (50 mg total) by mouth 2 (two) times daily. Marylynn Verneita CROME, MD  Active Pharmacy Records  sitaGLIPtin  (JANUVIA ) 50 MG tablet [id number]  Take 1  tablet (50 mg total) by mouth daily. Therisa Benton PARAS, NP  Active   sitaGLIPtin  (JANUVIA ) 50 MG tablet [id number] Yes Take 50 mg by mouth daily. [provider]  Active   torsemide  (DEMADEX ) 20 MG tablet [id number]  Take 20 mg by mouth daily.  Patient not taking: Reported on 06/17/2024   [provider]  Active   trolamine salicylate (ASPERCREME) 10 % cream [id number] Yes Apply 1 Application topically daily. [provider]  Active   Med List Note Maryelizabeth Lucie ONEIDA Bishop 07/22/17 1307): CPAP            Recommendation:   PCP Follow-up - bring all meds to PCP appointment Referral to: LCSW and Palliative Care Continue Current Plan of Care Call PCP today regarding eyes  Follow Up Plan:   Telephone follow-up in 1 week to finish initial assessment  Nestora Duos, MSN, RN [facility]  [facility], [facility] RN Care Manager Direct Dial: [phone] Fax: [phone]

## 2024-06-17 NOTE — Patient Instructions (Signed)
 Visit Information  Thank you for taking time to visit with me today. Please don't hesitate to contact me if I can be of assistance to you before our next scheduled appointment.  Our next appointment is by telephone on 06/25/2024 at 2:00 pm Please call the care guide team at [phone] if you need to cancel or reschedule your appointment.   Following is a copy of your care plan:   Goals Addressed             This Visit's Progress    VBCI RN Care Plan - Diabetes       Problems:  Chronic Disease Management support and education needs related to DMII  Goal: Over the next 90 days the Patient will attend all scheduled medical appointments: Patient will attend all appointments as evidenced by chart review        continue to work with RN Care Manager and/or Social Worker to address care management and care coordination needs related to DMII as evidenced by adherence to care management team scheduled appointments     take all medications exactly as prescribed and will call provider for medication related questions as evidenced by patient report    verbalize basic understanding of DMII disease process and self health management plan as evidenced by taking medications as prescribed, checking BG as directed by provider, reporting concerns promptly to provider, lifestyle modifications including diet, exercise.   Interventions:   Diabetes Interventions: Assessed patient's understanding of A1c goal: <7% Provided education to patient about basic DM disease process Reviewed medications with patient and discussed importance of medication adherence Counseled on importance of regular laboratory monitoring as prescribed Discussed plans with patient for ongoing care management follow up and provided patient with direct contact information for care management team Provided patient with written educational materials related to hypo and hyperglycemia and importance of correct treatment Advised patient,  providing education and rationale, to check cbg Fasting, before meals, evening as advised by provider and prn  and record, calling PCP for findings outside established parameters Review of patient status, including review of consultants reports, relevant laboratory and other test results, and medications completed Screening for signs and symptoms of depression related to chronic disease state  Assessed social determinant of health barriers Lab Results  Component Value Date   HGBA1C 7.6 (A) 01/07/2024    Patient Self-Care Activities:  Attend all scheduled provider appointments Call pharmacy for medication refills 3-7 days in advance of running out of medications Call provider office for new concerns or questions  Take medications as prescribed   Work with the social worker to address care coordination needs and will continue to work with the clinical team to address health care and disease management related needs check blood sugar at prescribed times: before meals and at bedtime and Fasting and as needed for symptoms check foot and legs daily for cuts, sores or redness take the blood sugar log to all doctor visits fill half of plate with vegetables manage portion size switch to sugar-free drinks do heel pump exercise 2 to 3 times each day keep legs up while sitting wash and dry legs and foot carefully every day wear comfortable, cotton sock wear comfortable, well-fitting shoe Report any changes in lower extremities/skin promptly to provider  Plan:  Telephone follow up appointment with care management team member scheduled for:  06/25/2024 at 2:00 pm             Please call the Suicide and Crisis Lifeline: 988 call the  [place]  National Suicide Prevention Lifeline: 509-207-5938 or TTY: 337-194-1421 TTY 639-749-5463) to talk to a trained counselor call 1-800-273-TALK (toll free, 24 hour hotline) go to [facility] [address], [place]  [phone]) call 911 if you are experiencing a Mental Health or Behavioral Health Crisis or need someone to talk to.  Patient verbalized understanding of Care plan and visit instructions communicated this visit  Nestora Duos, MSN, RN [facility]  [facility], [facility] RN Care Manager Direct Dial: [phone] Fax: [phone]  Leg Amputation, Care After The following information offers guidance on how to care for yourself after your procedure. Your health care provider may also give you more specific instructions. If you have problems or questions, contact your health care provider. What can I expect after the procedure? After the procedure, it is common to have: A little blood or fluid coming from your incision. Pain from your incision. Pain that feels like it is coming from the leg that has been removed (phantom pain). This can last for a year or longer. Skin breakdown on your stump (residual limb). Feelings of depression, anxiety, and fear. Follow these instructions at home: Medicines Take over-the-counter and prescription medicines only as told by your health care provider. If you were prescribed an antibiotic medicine, take it as told by your health care provider. Do not stop taking the antibiotic even if you start to feel better. Ask your health care provider if the medicine prescribed to you: Requires you to avoid driving or using machinery. Can cause constipation. You may need to take these actions to prevent or treat constipation: Take over-the-counter or prescription medicines. Eat foods that are high in fiber, such as beans, whole grains, and fresh fruits and vegetables. Limit foods that are high in fat and processed sugars, such as fried or sweet foods. Bathing Do not take baths, swim, use a hot tub, or get your residual limb wet until your health care provider approves. You may only be allowed to take sponge baths. Ask your health care  provider when you may start taking showers. After taking a shower, make sure to rinse and dry your residual limb carefully. Incision care  Check your residual limb, especially your incision area, every day. Check for: Blisters. Scrapes. Signs of infection, such as: More redness, swelling, or pain. More fluid or blood. Warmth. Pus or a bad smell. Follow instructions from your health care provider about how to take care of your incision. Make sure you: Wash your hands with soap and water  for at least 20 seconds before and after you change your bandage (dressing). If soap and water  are not available, use hand sanitizer. Change your dressing as told by your health care provider. Leave stitches (sutures), staples, skin glue, or adhesive strips in place. These skin closures may need to stay in place for 2 Natali Lavallee or longer. If adhesive strip edges start to loosen and curl up, you may trim the loose edges. Do not remove adhesive strips completely unless your health care provider tells you to do that. Activity Return to your normal activities as told by your health care provider. Ask your health care provider what activities are safe for you. Do physical therapy exercises as told by your health care provider. If you have been fitted with an artificial leg (prosthesis) or have been given crutches or another assistive device, use them as told by your health care provider. Eating and drinking Eat a healthy diet that includes whole grains,  fruits and vegetables, low-fat dairy products, and lean proteins. Drink enough fluid to keep your urine pale yellow. General instructions Do not use oils, lotion, cream, or rubbing alcohol  on the remaining part of your leg. Wear compression stockings as told by your health care provider. These stockings help to prevent blood clots and reduce swelling in your legs. Work with an occupational therapist to learn new strategies for safe driving with an amputation. If you  have trouble coping with your amputation, contact your health care provider. Some feelings of depression, anxiety, or fear are normal after an amputation, but if you struggle with these feelings or if they get overwhelming, your provider may be able to recommend a therapist or support group to help you. Do not use any products that contain nicotine or tobacco. These products include cigarettes, chewing tobacco, and vaping devices, such as e-cigarettes. These can delay bone and wound healing. If you need help quitting, ask your health care provider. Keep all follow-up visits. This is important. Contact a health care provider if: You have a fever. You have more tenderness in your residual limb. You have a rash or itchy skin. You have a cough or chills and you feel achy and weak. You have trouble coping with your amputation. You have blisters or scrapes on your residual limb. You have any of these signs of infection: More redness, swelling, or pain around your incision. More fluid or blood coming from your incision. The incision feels warm to the touch, tender, and painful. Pus or a bad smell coming from your incision. Get help right away if: You have severe pain in your residual limb. You feel light-headed and have shortness of breath. You have blood-soaked bandages. You have chest pain or pain when taking a deep breath or coughing. These symptoms may represent a serious problem that is an emergency. Do not wait to see if the symptoms will go away. Get medical help right away. Call your local emergency services (911 in the [place] Do not drive yourself to the hospital. If you ever feel like you may hurt yourself or others, or have thoughts about taking your own life, get help right away. Go to your nearest emergency department or: Call your local emergency services (911 in the [place] Call a suicide crisis helpline, such as the National Suicide Prevention Lifeline at 239-274-6942 or 988 in the  [place] This is open 24 hours a day in the [place] If you're a Veteran: Call 988 and press 1. This is open 24 hours a day. Text the [employer] at 765-646-2568. Summary After a leg amputation, you may have pain that feels like it is coming from the leg that was removed (phantom pain). This can last for a year or longer. Follow instructions from your health care provider about how to take care of your incision. Check your residual limb, especially your incision area, every day. More redness, swelling, or pain may be a sign of infection. Contact your health care provider if you have trouble coping with your amputation. This information is not intended to replace advice given to you by your health care provider. Make sure you discuss any questions you have with your health care provider. Document Revised: 02/14/2023 Document Reviewed: 11/03/2020 Elsevier Patient Education  2024 Elsevier Inc.   Type 2 Diabetes Mellitus, Self-Care, Adult When you have type 2 diabetes (type 2 diabetes mellitus), you must make sure your blood sugar (glucose) stays in a healthy range. You can do this with:  Nutrition. Exercise. Lifestyle changes. Medicines or insulin , if needed. Support from your doctors and others. What are the risks? Having type 2 diabetes can raise your risk for other long-term (chronic) health problems. You may get medicines to help prevent these problems. How to stay aware of your blood sugar  Check your blood sugar level every day, as often as told. Have your A1C (hemoglobin A1C) level checked two or more times a year. Have it checked more often if told. Your doctor will set personal treatment goals for you. In general, you should have these blood sugar levels: Before meals: 80-130 mg/dL (4.4-7.2 mmol/L). After meals: below 180 mg/dL (10 mmol/L). A1C: less than 7%. How to manage high and low blood sugar Symptoms of high blood sugar High blood sugar is also called hyperglycemia. Know the  symptoms of high blood sugar. These may include: More thirst. Hunger. Feeling very tired. Needing to pee (urinate) more often than normal. Seeing things blurry. Symptoms of low blood sugar Low blood sugar is also called hypoglycemia. This is when blood sugar is at or below 70 mg/dL (3.9 mmol/L). Symptoms may include: Hunger. Feeling worried or nervous (anxious). Feeling sweaty and cold to the touch (clammy). Being dizzy or light-headed. Feeling sleepy. A fast heartbeat. Feeling grouchy (irritable). Tingling or loss of feeling (numbness) around your mouth, lips, or tongue. Restless sleep. Diabetes medicines can cause low blood sugar. You are more at risk: While you exercise. After exercise. During sleep. When you are sick. When you skip meals or do not eat for a long time. Treating low blood sugar If you think you have low blood sugar, eat or drink something sugary right away. Keep 15 grams of a fast-acting carb (carbohydrate) with you all the time. Make sure your family and friends know how to treat you if you cannot treat yourself. Treating very low blood sugar Severe hypoglycemia is when your blood sugar is at or below 54 mg/dL (3 mmol/L). Severe hypoglycemia is an emergency. Get medical help right away. Call your local emergency services (911 in the [place] Do not wait to see if the symptoms will go away. Do not drive yourself to the hospital. You may need a glucagon  shot if you have very low blood sugar and you cannot eat or drink. Have a family member or friend learn how to check your blood sugar and how to give you a glucagon  shot. Ask your doctor if you should have a kit for glucagon  shots. Follow these instructions at home: Medicines Take prescribed insulin  or diabetes medicines as told by your health care provider. Do not run out of insulin  or other medicines. Plan ahead. If you use insulin , change the amount you take based on how active you are and what foods you eat. Your  doctor will tell you how to do this. Take over-the-counter and prescription medicines only as told by your doctor. Eating and drinking  Eat healthy foods. These include: Low-fat (lean) proteins. Complex carbs, such as whole grains. Fresh fruits and vegetables. Low-fat dairy products. Healthy fats. Meet with a food expert (dietitian) to make an eating plan. Follow instructions from your doctor about what you cannot eat or drink. Drink enough fluid to keep your pee (urine) pale yellow. Keep track of carbs that you eat. Read food labels and learn serving sizes of foods. Follow your sick-day plan when you cannot eat or drink as normal. Make this plan with your doctor so it is ready to use. Activity Exercise as told  by your doctor. You may need to: Do stretching and strength exercises two or more times a week. Do 150 minutes or more of exercise each week that makes your heart beat faster and makes you sweat. Spread out your exercise over 3 or more days a week. Do not go more than 2 days in a row without exercise. Talk with your doctor before you start a new exercise. Your doctor may tell you to change: How much insulin  or medicines you take. How much food you eat. Lifestyle Do not smoke or use any products that contain nicotine or tobacco. If you need help quitting, ask your doctor. If you drink alcohol  and your doctor says that it is safe for you: Limit how much you have to: 0-1 drink a day for women who are not pregnant. 0-2 drinks a day for men. Know how much alcohol  is in your drink. In the [place] one drink equals one 12 oz bottle of beer (355 mL), one 5 oz glass of wine (148 mL), or one 1 oz glass of hard liquor (44 mL). Learn to deal with stress. If you need help, ask your doctor. Body care  Stay up to date with your shots (immunizations). Have your eyes and feet checked by a doctor as often as told. Check your skin and feet every day. Check for cuts, bruises, redness, blisters,  or sores. Brush your teeth and gums two times a day. Floss one or more times a day. Go to the dentist one or more times every 6 months. Stay at a healthy weight. General instructions Share your diabetes care plan with: Your work or school. People you live with. Carry a card or wear jewelry that says you have diabetes. Keep all follow-up visits. Questions to ask your doctor Do I need to meet with a certified expert in diabetes education and care? Where can I find a support group? Where to find more information For help and guidance and more information about diabetes, please go to: American Diabetes Association: www.diabetes.org American Association of Diabetes Care and Education Specialists: www.diabeteseducator.org International Diabetes Federation: dconly.dk Summary When you have type 2 diabetes, you must make sure your blood sugar (glucose) stays in a healthy range. You can do this with nutrition, exercise, medicines and insulin , and support from doctors and others. Check your blood sugar every day, or as often as told. Having diabetes can raise your risk for other long-term health problems. You may get medicines to help prevent these problems. Share your diabetes management plan with people at work, school, and home. Keep all follow-up visits. This information is not intended to replace advice given to you by your health care provider. Make sure you discuss any questions you have with your health care provider. Document Revised: 09/26/2020 Document Reviewed: 09/26/2020 Elsevier Patient Education  2024 [employer].   Fall Prevention in the Home, Adult Falls can cause injuries and can happen to people of all ages. There are many things you can do to make your home safer and to help prevent falls. What actions can I take to prevent falls? General information Use good lighting in all rooms. Make sure to: Replace any light bulbs that burn out. Turn on the lights in dark areas  and use night-lights. Keep items that you use often in easy-to-reach places. Lower the shelves around your home if needed. Move furniture so that there are clear paths around it. Do not use throw rugs or other things on the floor that can  make you trip. If any of your floors are uneven, fix them. Add color or contrast paint or tape to clearly mark and help you see: Grab bars or handrails. First and last steps of staircases. Where the edge of each step is. If you use a ladder or stepladder: Make sure that it is fully opened. Do not climb a closed ladder. Make sure the sides of the ladder are locked in place. Have someone hold the ladder while you use it. Know where your pets are as you move through your home. What can I do in the bathroom?     Keep the floor dry. Clean up any water  on the floor right away. Remove soap buildup in the bathtub or shower. Buildup makes bathtubs and showers slippery. Use non-skid mats or decals on the floor of the bathtub or shower. Attach bath mats securely with double-sided, non-slip rug tape. If you need to sit down in the shower, use a non-slip stool. Install grab bars by the toilet and in the bathtub and shower. Do not use towel bars as grab bars. What can I do in the bedroom? Make sure that you have a light by your bed that is easy to reach. Do not use any sheets or blankets on your bed that hang to the floor. Have a firm chair or bench with side arms that you can use for support when you get dressed. What can I do in the kitchen? Clean up any spills right away. If you need to reach something above you, use a step stool with a grab bar. Keep electrical cords out of the way. Do not use floor polish or wax that makes floors slippery. What can I do with my stairs? Do not leave anything on the stairs. Make sure that you have a light switch at the top and the bottom of the stairs. Make sure that there are handrails on both sides of the stairs. Fix  handrails that are broken or loose. Install non-slip stair treads on all your stairs if they do not have carpet. Avoid having throw rugs at the top or bottom of the stairs. Choose a carpet that does not hide the edge of the steps on the stairs. Make sure that the carpet is firmly attached to the stairs. Fix carpet that is loose or worn. What can I do on the outside of my home? Use bright outdoor lighting. Fix the edges of walkways and driveways and fix any cracks. Clear paths of anything that can make you trip, such as tools or rocks. Add color or contrast paint or tape to clearly mark and help you see anything that might make you trip as you walk through a door, such as a raised step or threshold. Trim any bushes or trees on paths to your home. Check to see if handrails are loose or broken and that both sides of all steps have handrails. Install guardrails along the edges of any raised decks and porches. Have leaves, snow, or ice cleared regularly. Use sand, salt, or ice melter on paths if you live where there is ice and snow during the winter. Clean up any spills in your garage right away. This includes grease or oil spills. What other actions can I take? Review your medicines with your doctor. Some medicines can cause dizziness or changes in blood pressure, which increase your risk of falling. Wear shoes that: Have a low heel. Do not wear high heels. Have rubber bottoms and are closed at  the toe. Feel good on your feet and fit well. Use tools that help you move around if needed. These include: Canes. Walkers. Scooters. Crutches. Ask your doctor what else you can do to help prevent falls. This may include seeing a physical therapist to learn to do exercises to move better and get stronger. Where to find more information Centers for Disease Control and Prevention, STEADI: tonerpromos.no [employer] on Aging: baseringtones.pl National Institute on Aging: baseringtones.pl Contact a doctor if: You  are afraid of falling at home. You feel weak, drowsy, or dizzy at home. You fall at home. Get help right away if you: Lose consciousness or have trouble moving after a fall. Have a fall that causes a head injury. These symptoms may be an emergency. Get help right away. Call 911. Do not wait to see if the symptoms will go away. Do not drive yourself to the hospital. This information is not intended to replace advice given to you by your health care provider. Make sure you discuss any questions you have with your health care provider. Document Revised: 03/05/2022 Document Reviewed: 03/05/2022 Elsevier Patient Education  2024 [employer].

## 2024-06-17 NOTE — Telephone Encounter (Signed)
 Scheduled to see Chelsea Aurora, NP tomorrow.

## 2024-06-18 ENCOUNTER — Ambulatory Visit: Admitting: Nurse Practitioner

## 2024-06-18 ENCOUNTER — Telehealth: Payer: Self-pay | Admitting: Internal Medicine

## 2024-06-18 NOTE — Telephone Encounter (Signed)
Noted normal

## 2024-06-18 NOTE — Telephone Encounter (Signed)
 Noted! Thank you

## 2024-06-18 NOTE — Telephone Encounter (Signed)
 noted

## 2024-06-18 NOTE — Telephone Encounter (Signed)
 Copied from CRM #[id number]. Topic: Appointments - Appointment Info/Confirmation >> Jun 18, 2024 10:55 AM Alexandria E wrote: Patient/patient representative is calling for information regarding an appointment. Leita, interior and spatial designer of operations with [employer] called in stating that they did not have enough notice to bring patient to her appointment today at 10:40am. Leita stated she spoke with the patient and a taxi came to get her, but was not wheelchair accessible so she was not going to make it. Agent saw that appointment was originally scheduled by NT, so after the call ended with Leita, agent reached out to the patient to explain what was going on and that I would have to get a nurse back on the phone, and patient stated that she was able to get in over at [facility] at 1:40 today. Patient stated that she is still in the middle of finding transportation and will callback if she cannot make it.

## 2024-06-19 ENCOUNTER — Inpatient Hospital Stay: Admitting: Internal Medicine

## 2024-06-23 ENCOUNTER — Telehealth: Payer: Self-pay

## 2024-06-23 NOTE — Telephone Encounter (Signed)
 Left a detailed message on confidential voicemail with the okay to proceed if insurance will cover without the visit first.

## 2024-06-23 NOTE — Telephone Encounter (Signed)
 Copied from CRM #[id number]. Topic: Clinical - Home Health Verbal Orders >> Jun 23, 2024  9:58 AM Harlene ORN wrote: [facility] Wants to proceed with home health before her scheduled appointment.  Phone: [phone]

## 2024-06-23 NOTE — Telephone Encounter (Signed)
 Is it okay to proceed with home health before her scheduled appt with Dr. Narendra?

## 2024-06-25 ENCOUNTER — Other Ambulatory Visit: Payer: Self-pay

## 2024-06-25 DIAGNOSIS — H01002 Unspecified blepharitis right lower eyelid: Secondary | ICD-10-CM | POA: Diagnosis not present

## 2024-06-25 DIAGNOSIS — H01004 Unspecified blepharitis left upper eyelid: Secondary | ICD-10-CM | POA: Diagnosis not present

## 2024-06-25 NOTE — Patient Outreach (Signed)
 Complex Care Management   Visit Note  06/25/2024  Name:  Denise Sharp MRN: [id number] DOB: 1945-08-17  Situation: Referral received for Complex Care Management related to Diabetes with Complications I obtained verbal consent from Patient.  Visit completed with Patient  on the phone  Background:   Past Medical History:  Diagnosis Date   (HFpEF) heart failure with preserved ejection fraction (HCC)    a. 12/2019 Echo: EF 65-70%, Gr2 DD. No significant valvular dzs.   Abnormality of gait 03/25/2013   Adrenal mass, left    Anginal pain    Anxiety    Aortic atherosclerosis    Arthritis    Asthma    CAD (coronary artery disease) with h/o Atypical Chest Pain    a. 04/1986 Cath ([facility]): nl cors, EF 65%; b. 07/2012 Cath [facility]): Diff minor irregs; c. 07/2016 [facility]): Equivocal; d. 08/2016 Cardiac CT Ca2+ score [facility]): Ca2+ 1548; e. 05/2019 [facility]: No ischemia. EF 75%; f. 12/2019 PCI: LM nl, LAD 65ost (3.5x12 Resolute DES), 21m (2.75x12 Resolute DES),LCX/RCA nl; g. 08/2020 Cath: patent LAD stents, RCA 40ost, elev RH pressures, EF >65%.   Cervical spinal stenosis 1994   due to trauma to back ([employer] accident), has intermittent paralysis and parasthesias   Cervicogenic headache 03/23/2014   CKD (chronic kidney disease), stage III (HCC)    Depression    Diverticulosis    Dizziness    a.) chronic   DJD (degenerative joint disease)    a. Chronic R shoulder pain   Dyspnea    Esophageal stenosis 09/2009   a.) transient outlet obstruction by food, cleared by EGD   Family history of adverse reaction to anesthesia    a.) daughter with (+) PONV   Gastric bypass status for obesity    GERD (gastroesophageal reflux disease)    Gout    Headache(784.0)    HLD (hyperlipidemia)    Hypertension    IBS (irritable bowel syndrome)    IDA (iron  deficiency anemia)    a.) post 2 unit txfsn 2009, normal endo/colonoscopy by [facility]   Left bundle branch block (LBBB)    a.) Intermittently present - likely rate  related.   NSVT (nonsustained ventricular tachycardia) (HCC) 11/15/2020   a.) Holter study 11/15/2020; 11 episodes of NSVT lasting up to 8 beats with a maximum rate of 210 bpm; 14 atrial runs lasting up to 18 beats with a rate of up to 250 bpm.  Some atrial runs felt to be NSVT.   NSVT (nonsustained ventricular tachycardia) (HCC)    a. 10/2020 [facility]: 11 runs of NSVT up to 8 beats.   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 10/2020 [facility]: 14 atrial runs up to 18 beats, max HR 250.   Restless leg syndrome    Rotator cuff arthropathy, right 08/13/2017   Syncope and collapse 03/12/2014   Type II diabetes mellitus (HCC)     Assessment: Patient Reported Symptoms:  Cognitive Cognitive Status: No symptoms reported Cognitive/Intellectual Conditions Management [RPT]: None reported or documented in medical history or problem list   Health Maintenance Behaviors: Annual physical exam  Neurological Neurological Review of Symptoms: Numbness, Other: [place] Neurological Symptoms/Conditions [RPT]: neuropathy right leg phantom pain LLE (BKA) reports using old tramadol  with improvement Neurological Management Strategies: Routine screening, Medication therapy  HEENT HEENT Symptoms Reported: Other: HEENT Comment: eye discharge -  received eye drops from ophthalmology approx 1 week ago with some improvement but still issues, saw eye doc today and prescribed steroid drops, suspects allergic reaction due to rsh on face and forehead, picking up drops tonight    Cardiovascular Cardiovascular Symptoms Reported: No symptoms reported Cardiovascular Comment: reports not needing midodrine BP stayig 100-120/60-70  Respiratory Respiratory Symptoms Reported: No symptoms reported Additional Respiratory Details: OSA w/CPAP Respiratory Management Strategies: CPAP  Endocrine Endocrine  Symptoms Reported: No symptoms reported Is patient diabetic?: Yes Is patient checking blood sugars at home?: Yes List most recent blood sugar readings, include date and time of day: received Libre 3 - using without issues - [facility] nurse arrived and visit ended before details re BG but reported good in general conversation    Gastrointestinal Gastrointestinal Symptoms Reported: Incontinence Additional Gastrointestinal Details: using immodium prn with improvement Gastrointestinal Management Strategies: Incontinence garment/pad, Medication therapy    Genitourinary Genitourinary Symptoms Reported: Incontinence Genitourinary Management Strategies: Incontinence garment/pad  Integumentary Additional Integumentary Details: L BKA closed incision, rash on upper cheeks/forehead, ophthalmology things related to eye issue nd allergic reaction - possibly soap - prescribed cream and drops to be started tonight    Musculoskeletal Musculoskelatal Symptoms Reviewed: Joint pain, Back pain, Weakness, Muscle pain Other Musculoskeletal Symptoms: wheelchair bound post L BKA unable to stand on right leg, [facility] PT/OT/Nursing, RNCM messaging PCP re hospital bed concerns and need for mibile trapeze for transfers, possible transport wheelchair to help with movement in home due to limited door size, using [employer] transportation Musculoskeletal Management Strategies: Medication therapy, Routine screening Falls in the past year?: Yes Number of falls in past year: 1 or less Was there an injury with Fall?: Yes Fall Risk Category Calculator: 2 Patient Fall Risk Level: Moderate Fall Risk Patient at Risk for Falls Due to: History of fall(s), Impaired mobility, Orthopedic patient Fall risk Follow up: Falls evaluation completed, Falls prevention discussed  Psychosocial Psychosocial Symptoms Reported: Anxiety - if selected complete GAD, Depression - if selected complete PHQ 2-9 Additional Psychological Details: patient  continues to be very talkative, reports doing better with transportation arranged, [facility] services PT/OT/SW/Nursing/HHA, though concerned will need more HHA assist due to husbands illness, spoke with Palliative Care and receiving resources from them, reports they do not plan on coming out and will call her next month Behavioral Management Strategies: Support system Behavioral Health Comment: LCSW apt 07/13/24        06/25/2024    PHQ2-9 Depression Screening   Little interest or pleasure in doing things Nearly every day  Feeling down, depressed, or hopeless Nearly every day  PHQ-2 - Total Score 6  Trouble falling or staying asleep, or sleeping too much Nearly every day  Feeling tired or having little energy Nearly every day  Poor appetite or overeating  Several days  Feeling bad about yourself - or that you are a failure or have let yourself or your family down More than half the days  Trouble concentrating on things, such as reading the newspaper or watching television Not at all  Moving or speaking so slowly that other people could have noticed.  Or the opposite - being so fidgety or restless that you have been moving around a lot more than usual Not at all  Thoughts that you would be better off dead, or hurting yourself in some way Not at all  PHQ2-9 Total Score 15  If you checked off any problems, how difficult have these problems made it for you to do your work,  take care of things at home, or get along with other people Somewhat difficult  Depression Interventions/Treatment      There were no vitals filed for this visit.    Medications Reviewed Today     Reviewed by Devra Lands, RN (Registered Nurse) on 06/25/24 at 1419  Med List Status: <None>   Medication Order Taking? Sig Documenting Provider Last Dose Status Informant  acetaminophen  (TYLENOL ) 325 MG tablet [id number] Yes Take 2 tablets (650 mg total) by mouth every 6 (six) hours as needed for mild pain (pain score  1-3). Jens Durand, MD  Active   allopurinol  (ZYLOPRIM ) 100 MG tablet [id number] Yes Take 100 mg by mouth daily. [provider]  Active   apixaban  (ELIQUIS ) 5 MG TABS tablet [id number] Yes Take 5 mg by mouth 2 (two) times daily. [provider]  Active   atorvastatin  (LIPITOR ) 80 MG tablet [id number] Yes Take 80 mg by mouth daily. [provider]  Active   budesonide -formoterol  (SYMBICORT ) 160-4.5 MCG/ACT inhaler [id number] Yes Inhale 2 puffs into the lungs 2 (two) times daily. [provider]  Active   cetirizine (ZYRTEC) 10 MG tablet [id number] Yes Take 10 mg by mouth daily. [provider]  Active   Cholecalciferol  (VITAMIN D3) 1.25 MG (50000 UT) CAPS [id number] Yes Take 5,000 Units by mouth daily. [provider]  Active   clotrimazole -betamethasone  (LOTRISONE ) cream [id number]  Apply 1 application topically 2 (two) times daily as needed (irritation).  Patient not taking: Reported on 06/17/2024   [provider]  Active Pharmacy Records  colchicine  0.6 MG tablet [id number] Yes Take 0.6 mg by mouth daily. [provider]  Active   conjugated estrogens  (PREMARIN ) vaginal cream [id number]  Apply one pea-sized amount around the opening of the urethra daily for 2 Klyn Kroening, then 3 times weekly moving forward.  Patient not taking: Reported on 06/25/2024   Vaillancourt, Samantha, PA-C  Active Pharmacy Records  Continuous Glucose Sensor (FREESTYLE LIBRE 3 PLUS SENSOR) [place] [id number] Yes Change sensor every 15 days. Marylynn Verneita CROME, MD  Active   dicyclomine  (BENTYL ) 20 MG tablet [id number] Yes Take 1 tablet (20 mg total) by mouth 3 (three) times daily before meals. Marylynn Verneita CROME, MD  Active Pharmacy Records  DULoxetine  (CYMBALTA ) 60 MG capsule [id number] Yes Take 60 mg by mouth daily. [provider]  Active   ezetimibe  (ZETIA ) 10 MG tablet [id number]  Take 10 mg by mouth daily.  Patient taking differently: Take 10 mg by mouth daily.  Unsure if taking   [provider]  Active   Finerenone  (KERENDIA ) 10 MG TABS [id number] Yes Take 1 tablet (10 mg total) by mouth in the morning. [place] A, FNP  Active   fluconazole  (DIFLUCAN ) 100 MG tablet [id number]  Take 1 tablet (100 mg total) by mouth daily. X 7 days  Patient not taking: Reported on 06/17/2024   Helon Kirsch A, PA-C  Active   fluticasone  (FLONASE ) 50 MCG/ACT nasal spray [id number]  Place 2 sprays into both nostrils daily.  Patient not taking: Reported on 06/17/2024   Maribeth Camellia MATSU, MD  Active Pharmacy Records  Glucagon  (GVOKE HYPOPEN  2-PACK) 0.5 MG/0.1ML EMMANUEL [id number] Yes Inject 1 auto injector subcutaneously as needed for severe hypoglycemia. May repeat x 1 after 15 minutes if needed. Gretel App, NP  Active Pharmacy Records  glucose blood [facility] VERIO) test strip [id number] Yes Use to check blood sugar twice daily. Marylynn Verneita CROME, MD  Active Pharmacy Records  insulin  aspart (NOVOLOG  FLEXPEN)  100 UNIT/ML FlexPen [id number] Yes Inject 8-14 Units into the skin 3 (three) times daily with meals. 90-150= 8 units; 151-200=9 units; 201-250=10 units; 251-300= 11 units; 301-350= 12 units; 351-400=13 units; 401 or higher =14 units Reardon, Benton PARAS, NP  Active   insulin  degludec (TRESIBA ) 100 UNIT/ML FlexTouch Pen [id number] Yes Inject 15 Units into the skin at bedtime. Therisa Benton PARAS, NP  Active   Insulin  Pen Needle (DROPLET PEN NEEDLES) 31G X 5 MM MISC [id number] Yes USE ONE NEEDLE SUBCUTANEOUSLY AS DIRECTED (REMOVE AND DISCARD NEEDLE IN SHARPS CONTAINER IMMEDIATELY AFTER USE) Therisa Benton PARAS, NP  Active Pharmacy Records  levothyroxine  (SYNTHROID ) 25 MCG tablet [id number] Yes Take 25 mcg by mouth daily before breakfast. [provider]  Active   lidocaine  (LIDODERM ) 5 % [id number]  Place 1 patch onto the skin daily. Remove & Discard patch within 12 hours or as directed by MD  Patient not taking: Reported on 06/17/2024   [provider]   Active   loperamide  (IMODIUM ) 2 MG capsule [id number] Yes Take 4-6 mg by mouth 3 (three) times daily before meals.  Patient taking differently: Take 4-6 mg by mouth 3 (three) times daily before meals. As needed   [provider]  Active Pharmacy Records  midodrine (PROAMATINE) 5 MG tablet [id number] Yes Take 5 mg by mouth as needed. [provider]  Active   nitroGLYCERIN  (NITROSTAT ) 0.4 MG SL tablet [id number] Yes Place 0.4 mg under the tongue every 5 (five) minutes as needed for chest pain. [provider]  Active   nystatin  (MYCOSTATIN /NYSTOP ) powder [id number] Yes Apply 1 g topically 4 (four) times daily as needed (rash). [provider]  Active Pharmacy Records  ondansetron  (ZOFRAN ) 4 MG tablet [id number] Yes Take 4 mg by mouth every 8 (eight) hours as needed for nausea or vomiting. [provider]  Active   OneTouch Delica Lancets 33G MISC [id number] Yes Used to check blood sugar two times a day. Marylynn Verneita CROME, MD  Active Pharmacy Records  oxycodone  (OXY-IR) 5 MG capsule [id number] Yes Take 5 mg by mouth every 4 (four) hours as needed. 2.5 mg as needed every 6 hours [provider]  Active   pantoprazole  (PROTONIX ) 40 MG tablet [id number] Yes Take 40 mg by mouth daily. [provider]  Active   phenylephrine -shark liver oil-mineral oil-petrolatum (PREPARATION H) 0.25-14-74.9 % rectal ointment [id number] Yes Place 1 Application rectally 2 (two) times daily as needed for hemorrhoids (prn every 8 hours). [provider]  Active   Polyethyl Glycol-Propyl Glycol (SYSTANE OP) [id number]  Place 1 drop into both eyes 4 (four) times daily as needed (dry eyes).  Patient not taking: Reported on 06/25/2024   [provider]  Active Pharmacy Records  pregabalin  (LYRICA ) 50 MG capsule [id number] Yes Take 1 capsule (50 mg total) by mouth 2 (two) times daily. Marylynn Verneita CROME, MD  Active Pharmacy Records  sitaGLIPtin  (JANUVIA ) 50 MG tablet  [id number] Yes Take 1 tablet (50 mg total) by mouth daily. Therisa Benton PARAS, NP  Active   sitaGLIPtin  (JANUVIA ) 50 MG tablet [id number]  Take 50 mg by mouth daily. [provider]  Active   torsemide  (DEMADEX ) 20 MG tablet [id number]  Take 20 mg by mouth daily.  Patient not taking: Reported on 06/17/2024   [provider]  Active   trolamine salicylate (ASPERCREME) 10 % cream [id number] Yes Apply 1 Application topically daily. [provider]  Active   Med List Note Maryelizabeth, Lucie DASEN, CPhT  07/22/17 1307): CPAP            Recommendation:   PCP Follow-up Continue Current Plan of Care  Follow Up Plan:   Telephone follow-up in 1 month  Nestora Duos, MSN, RN [facility]  [facility] RN Care Manager Direct Dial: [phone] Fax: [phone]

## 2024-06-25 NOTE — Patient Instructions (Signed)
 Visit Information  Thank you for taking time to visit with me today. Please don't hesitate to contact me if I can be of assistance to you before our next scheduled appointment.  Our next appointment is by telephone on 07/23/2024 at 1:00 pm Please call the care guide team at [phone] if you need to cancel or reschedule your appointment.   Following is a copy of your care plan:   Goals Addressed             This Visit's Progress    [facility] RN Care Plan - Diabetes       Problems:  Chronic Disease Management support and education needs related to DMII  Goal: Over the next 90 days the Patient will attend all scheduled medical appointments: Patient will attend all appointments as evidenced by chart review        continue to work with RN Care Manager and/or Social Worker to address care management and care coordination needs related to DMII as evidenced by adherence to care management team scheduled appointments     take all medications exactly as prescribed and will call provider for medication related questions as evidenced by patient report    verbalize basic understanding of DMII disease process and self health management plan as evidenced by taking medications as prescribed, checking BG as directed by provider, reporting concerns promptly to provider, lifestyle modifications including diet, exercise.   Interventions:   Diabetes Interventions: Assessed patient's understanding of A1c goal: <7% Provided education to patient about basic DM disease process Reviewed medications with patient and discussed importance of medication adherence - compliant with DM meds Counseled on importance of regular laboratory monitoring as prescribed Discussed plans with patient for ongoing care management follow up and provided patient with direct contact information for care management team Provided patient with written educational materials related to hypo and hyperglycemia and importance of correct  treatment Advised patient, providing education and rationale, to check cbg Fasting, before meals, evening as advised by provider and prn  and record, calling PCP for findings outside established parameters - has CGM, sent information on DM Management Review of patient status, including review of consultants reports, relevant laboratory and other test results, and medications completed Screening for signs and symptoms of depression related to chronic disease state  Assessed social determinant of health barriers - receiving [employer] transportation [facility] Lab Results  Component Value Date   HGBA1C 7.6 (A) 01/07/2024    Patient Self-Care Activities:  Attend all scheduled provider appointments Call pharmacy for medication refills 3-7 days in advance of running out of medications Call provider office for new concerns or questions  Take medications as prescribed   Work with the social worker to address care coordination needs and will continue to work with the clinical team to address health care and disease management related needs check blood sugar at prescribed times: before meals and at bedtime and Fasting and as needed for symptoms check foot and legs daily for cuts, sores or redness take the blood sugar log to all doctor visits fill half of plate with vegetables manage portion size switch to sugar-free drinks do heel pump exercise 2 to 3 times each day keep legs up while sitting wash and dry legs and foot carefully every day wear comfortable, cotton sock wear comfortable, well-fitting shoe Report any changes in lower extremities/skin promptly to provider  Plan:  Telephone follow up appointment with care management team member scheduled for:  07/23/2024 at 1:00 pm  Please call the Suicide and Crisis Lifeline: 988 call the [place]  National Suicide Prevention Lifeline: 787-845-1327 or TTY: 708-295-1118 TTY 7690568287) to talk to a trained counselor call  1-800-273-TALK (toll free, 24 hour hotline) go to [facility] [address], [place] [phone]) call 911 if you are experiencing a Mental Health or Behavioral Health Crisis or need someone to talk to.  Patient verbalized understanding of Care plan and visit instructions communicated this visit - sending via mail.  Nestora Duos, MSN, RN [place]  [facility], [facility] RN Care Manager Direct Dial: [phone] Fax: [phone]   Type 1 Diabetes Mellitus, Self-Care, Adult Caring for yourself after you have been diagnosed with type 1 diabetes (type 1 diabetes mellitus) means keeping your blood sugar (glucose) within target range with a balance of: Insulin . Nutrition. Exercise. Lifestyle changes. Other medicines, if needed. Support from your team of health care providers and others. The following information explains what you need to know to manage your diabetes at home. What are the risks? Having diabetes puts you at higher risk for other long-term (chronic) conditions, such as heart disease and kidney disease. These problems can get worse if you do not keep your blood glucose within the target range. How to monitor blood glucose  Check your blood glucose levels frequently by using either a blood glucose meter or a continuous blood glucose monitor or as told by your health care provider. Have your A1C (hemoglobin A1C) level checked two or more times a year, or as often as told by your health care provider. Your health care provider will set individualized treatment goals for you. Generally, the goal of treatment is to maintain the following blood glucose levels: Before meals (preprandial): 80-130 mg/dL (4.4-7.2 mmol/L). After meals (postprandial): below 180 mg/dL (10 mmol/L). Hemoglobin A1C level: less than 7%. Follow these instructions at home: Medicines Take over-the-counter and prescription medicines only as  told by your health care provider. Take your insulin  and other medicines every day as told. Do not run out of insulin  or other medicines that you take. Plan ahead so you always have these available. Adjust your insulin  delivery system (insulin  pump) or dosage based on how physically active you are and what foods you eat. Your health care provider will tell you how to adjust your dosage. Eating and drinking  What you eat and drink affects your blood glucose and insulin  dosage. Making good choices helps to control your diabetes and prevent other health problems. A healthy meal plan includes eating lean proteins, complex carbohydrates, fresh fruits and vegetables, low-fat dairy products, and healthy fats. Make an appointment to see a registered dietitian to help you create an eating plan that is right for you. Make sure that you: Follow instructions from your health care provider about eating or drinking. Drink enough fluid to keep your urine pale yellow. Monitor how many grams of carbohydrates you eat. You can do this by learning how to count carbohydrates, by reading food labels, and by learning the standard serving sizes of food. Follow your sick-day plan whenever you cannot eat or drink as usual. Make this plan in advance with your health care provider. Always have a 15-gram rapid-acting carbohydrate snack with you to treat low blood sugar (hypoglycemia). Activity Exercise regularly, as told by your health care provider. This may include: Stretching and doing strength exercises, such as yoga or weight lifting, 3 or more times a week. Doing 150 minutes or more of moderate-intensity exercise or  75 minutes or more of vigorous-intensity exercise each week. This could be brisk walking, biking, or water  aerobics. Spread out your activity over 3 or more days of the week. Do not go more than 2 days in a row without doing some kind of physical activity. When you start a new exercise or activity, work with  your health care provider to adjust your insulin  or food intake as needed. Lifestyle Do not use any products that contain nicotine or tobacco. These products include cigarettes, chewing tobacco, and vaping devices, such as e-cigarettes. If you need help quitting, ask your health care provider. Do not drink alcohol  if: Your health care provider tells you not to drink. You are pregnant, may be pregnant, or are planning to become pregnant. If your health care provider says that alcohol  is safe for you: Limit how much you have to: 0-1 drink a day for women. 0-2 drinks a day for men. Know how much alcohol  is in your drink. In the [place] one drink equals one 12 oz bottle of beer (355 mL), one 5 oz glass of wine (148 mL), or one 1 oz glass of hard liquor (44 mL). Learn to manage stress. If you need help with this, ask your health care provider. Care for your body  Keep your immunizations up to date. In addition to getting vaccinations as told by your health care provider, it is recommended that you get vaccinated against: The flu (influenza). Get a flu shot every year. Pneumonia. Hepatitis B. Schedule an eye exam within 5 years after your diagnosis. Depending on the results of the eye exam, schedule another exam 1-2 years later. Check your skin and feet every day for cuts, bruises, redness, blisters, or sores. Schedule a complete foot exam 5 years after your diagnosis, and then every year after the first exam. Brush your teeth and gums twice a day, and floss one or more times a day. Visit your dentist one or more times every 6 months. Maintain a healthy weight. General instructions Share your diabetes management plan with people at work, school, and home. Family members and close friends should: Learn the symptoms of hypoglycemia. Understand how to treat hypoglycemia in case you cannot treat it yourself. Check your urine for ketones when you are ill and as told by your health care  provider. Carry a medical alert card or wear medical alert jewelry. Keep all follow-up visits. This is important. Questions to ask your health care provider Do I need to meet with a certified diabetes care and education specialist? Where can I find a support group for people with diabetes? Do I need to have an emergency glucagon  kit available? Where to find more information American Diabetes Association (ADA): diabetes.org Association of Diabetes Care and Education Specialists (ADCES): diabeteseducator.org International Diabetes Federation (IDF): http://hill.biz/ Get help right away if: Your blood glucose level is below 54 mg/dL (3 mmol/L). You have moderate or large ketone levels in your urine. These symptoms may be an emergency. Get help right away. Call 911. Do not wait to see if the symptoms will go away. Do not drive yourself to the hospital. Summary Caring for yourself after you have been diagnosed with type 1 diabetes (type 1 diabetes mellitus) means keeping your blood sugar (glucose) within the target range. You can do this with a balance of insulin  and other medicines, nutrition, exercise, lifestyle changes, and support from others. Having diabetes puts you at higher risk for other health problems. These can get worse if you  do not keep your blood glucose under control. Check your blood glucose levels frequently. Keep all follow-up visits. This is important. This information is not intended to replace advice given to you by your health care provider. Make sure you discuss any questions you have with your health care provider. Document Revised: 07/04/2021 Document Reviewed: 07/04/2021 Elsevier Patient Education  2024 Elsevier Inc.    Hypoglycemia Hypoglycemia is when the amount of sugar, or glucose, in your blood is too low. Low blood sugar can happen if you have diabetes or if you don't have diabetes. It may be an emergency. What are the causes? Low blood sugar happens most often in  people who have diabetes. It may be caused by: Diabetes medicine. Not eating enough, or not eating often enough. Being more active than normal. If you don't have diabetes, you may still get low blood sugar if: There's a tumor in your pancreas. A tumor is a growth of cells that isn't normal. You don't eat enough, or you fast. Fasting is when you don't eat for long periods at a time. You have a bad infection or illness. You have problems after weight loss surgery. You have kidney or liver problems. You take certain medicines. What increases the risk? You're more likely to have low blood sugar if: You have diabetes and take medicine for it. You drink a lot of alcohol . You get sick. What are the signs or symptoms? Mild Hunger or feeling like you may vomit. Sweating and feeling cold to the touch. Feeling dizzy or light-headed. Being sleepy or having trouble sleeping. A headache. Blurry vision. Mood changes. These include feeling worried, nervous, or easily annoyed. Moderate Feeling confused. Changes in the way you act. Weakness. An uneven heartbeat. Very bad Having very low blood sugar is an emergency. It can cause: Fainting. Seizures. A coma. Death. How is this diagnosed?  Low blood sugar can be found with a blood test. This test tells you how much sugar is in your blood. It's done while you're having symptoms. Your health care provider may also do an exam and look at your medical history. How is this treated? Treating low blood sugar If you have low blood sugar, eat or drink something with sugar in it right away. The food or drink should have 15 grams of a fast-acting carbohydrate (carb). Options include: 4 oz (120 mL) of fruit juice. 4 oz (120 mL) of soda (not diet soda). A few pieces of hard candy. Check food labels to see how many pieces to eat. 1 Tbsp (15 mL) of sugar or honey. 4 glucose tablets. 1 tube of glucose gel. Treating low blood sugar if you have  diabetes Talk with your provider about how much carb you should take. If you're alert and can swallow safely, you may follow the 15:15 rule: Take 15 grams of a fast-acting carb. Check your blood sugar 15 minutes after you take the carb. If your blood sugar is still at or below 70 mg/dL (3.9 mmol/L), take 15 grams of a carb again. If your blood sugar doesn't go above 70 mg/dL (3.9 mmol/L) after 3 tries, get help right away. After your blood sugar goes back to normal, eat a meal or a snack within 1 hour. Always keep 15 grams of a fast-acting carb with you. This could be: 4 glucose tablets. A few pieces of hard candy. 1 Tbsp (15 mL) of honey or sugar. 1 tube of glucose gel. Treating very low blood sugar If your blood sugar  is less than 54 mg/dL (3 mmol/L), it's an emergency. Get help right away. If you can't eat or drink, you will need to be given glucagon . A family member or friend should learn how to check your blood sugar and give you glucagon . Ask your provider if you should keep a glucagon  kit at home. You may also need to be treated in a hospital. Follow these instructions at home: If you have diabetes: Always keep a fast-acting carb (15 grams) with you. Follow your diabetes care plan. Make sure you: Know the symptoms of low blood sugar. Check your blood sugar as often as told. Always check it before and after you exercise. Always check your blood sugar before you drive. Take your medicines as told. Eat on time. Do not skip meals. Share your diabetes care plan with: Your work or school. The people you live with. Wear an alert bracelet or carry a card that says you have diabetes. General instructions If you drink alcohol : Limit how much you have to: 0-1 drink a day if you're female. 0-2 drinks a day if you're female. Know how much alcohol  is in your drink. In the [place] one drink is one 12 oz bottle of beer (355 mL), one 5 oz glass of wine (148 mL), or one 1 oz glass of hard liquor  (44 mL). Be sure to eat food when you drink alcohol . Be sure to check your blood sugar after you drink. Alcohol  may lead to low blood sugar later. Where to find more information American Diabetes Association (ADA): diabetes.org Contact a health care provider if: You have low blood sugar often. You have diabetes and are having trouble keeping your blood sugar in the right range. Get help right away if: You can't get your blood sugar above 70 mg/dL (3.9 mmol/L) after 3 tries. Your blood sugar is below 54 mg/dL (3 mmol/L). You have a seizure. You faint. These symptoms may be an emergency. Call 911 right away. Do not wait to see if the symptoms will go away. Do not drive yourself to the hospital. This information is not intended to replace advice given to you by your health care provider. Make sure you discuss any questions you have with your health care provider. Document Revised: 04/04/2023 Document Reviewed: 09/19/2022 Elsevier Patient Education  2024 Elsevier Inc.  Hyperglycemia Hyperglycemia is when the amount of sugar, or glucose, in your blood is too high. High blood sugar can happen if you have diabetes or if you don't have diabetes. It may be an emergency. What are the causes? If you have diabetes, high blood sugar may be caused by: Medicines that increase blood sugar. Not giving yourself enough insulin  (if you take it). Being less active than normal. Eating more than planned. Illness, an injury, or an infection. Having surgery. Stress. If you don't have diabetes, high blood sugar may be caused by: Certain medicines, such as steroids or thiazide diuretics. Stress. A bad illness or infection. Having surgery. Diseases of the pancreas. What increases the risk? You're more likely to have high blood sugar if: Someone in your family has diabetes. You're overweight. You aren't active. You have or have had: Prediabetes. Diabetes when pregnant. Polycystic ovarian syndrome  (PCOS). What are the signs or symptoms? High blood sugar may not cause symptoms. If you do have symptoms, they may include: Feeling more thirsty than normal. Needing to pee more often than normal. Hunger. Feeling very tired. Blurry eyesight. You may have other symptoms if your high blood  sugar isn't treated. These may include: Pain in your belly. A headache. Weakness. Weight loss that's not planned. A tingling or numb feeling in your hands or feet. Cuts or bruises that heal slowly. How is this diagnosed?  High blood sugar is diagnosed with a blood test. This test tells you how much sugar is in your blood. It's done while you're having symptoms.  Your health care provider may also do a physical exam, look at your medical history, and do more blood tests. These tests may include: A fasting blood glucose (FBG) test. You can't eat for at least 8 hours before this test. An A1C test. A glucose tolerance test. How is this treated? Treatment may include: Taking medicine to control your blood sugar levels. Changing your medicine or how much you take if you take insulin  or other diabetes medicines. Checking your blood sugar more often. Making changes to your daily life. These may include: Being more active. Eating healthier foods. Losing weight. Treating an illness or infection. Stopping or taking less steroids. If your high blood sugar stays high, you may need to be treated in the hospital. Follow these instructions at home: If you have diabetes:  Know the symptoms of high blood sugar. Follow your diabetes care plan. Make sure you: Take insulin  and medicines as told. Check your blood sugar as often as told. Eat on time. Do not skip meals. Check your blood sugar before and after you exercise. If you exercise longer or harder than normal, check your blood sugar more often. Follow your sick day plan when you can't eat or drink like normal. Make this plan ahead of time with your  provider. Share your diabetes care plan with: Your work or school. The people you live with. Wear an alert bracelet or carry a card that says you have diabetes. General instructions Take medicines only as told by your provider. Drink enough fluid to keep your pee (urine) pale yellow. Make sure you drink enough when you: Exercise. Get sick. Are in hot places. If you drink alcohol : Limit how much you have to: 0-1 drink a day if you're female. 0-2 drinks a day if you're female. Know how much alcohol  is in your drink. In the [place] one drink is one 12 oz bottle of beer (355 mL), one 5 oz glass of wine (148 mL), or one 1 oz glass of hard liquor (44 mL). Manage stress. If you need help with this, ask your provider. Exercise as told. Try to stay at a healthy weight. Keep all follow-up visits. Your provider will want to make sure your high blood sugar is treated. Where to find more information American Diabetes Association (ADA): diabetes.org Contact a health care provider if: You have diabetes and have trouble keeping your blood sugar in the right range. Your blood sugar is at or above 240 mg/dL (86.6 mmol/L) for 2 days in a row. You have high blood sugar often. You have signs of illness, such as: Nausea or vomiting. A headache. A fever. You can't stop vomiting. Get help right away if: Your blood sugar monitor reads high even when you're taking insulin . You have trouble breathing. These symptoms may be an emergency. Call 911 right away. Do not wait to see if the symptoms will go away. Do not drive yourself to the hospital. This information is not intended to replace advice given to you by your health care provider. Make sure you discuss any questions you have with your health care provider. Document Revised:  04/04/2023 Document Reviewed: 09/19/2022 Elsevier Patient Education  2024 [employer].     Fall Prevention in the Home, Adult Falls can cause injuries and can happen to  people of all ages. There are many things you can do to make your home safer and to help prevent falls. What actions can I take to prevent falls? General information Use good lighting in all rooms. Make sure to: Replace any light bulbs that burn out. Turn on the lights in dark areas and use night-lights. Keep items that you use often in easy-to-reach places. Lower the shelves around your home if needed. Move furniture so that there are clear paths around it. Do not use throw rugs or other things on the floor that can make you trip. If any of your floors are uneven, fix them. Add color or contrast paint or tape to clearly mark and help you see: Grab bars or handrails. First and last steps of staircases. Where the edge of each step is. If you use a ladder or stepladder: Make sure that it is fully opened. Do not climb a closed ladder. Make sure the sides of the ladder are locked in place. Have someone hold the ladder while you use it. Know where your pets are as you move through your home. What can I do in the bathroom?     Keep the floor dry. Clean up any water  on the floor right away. Remove soap buildup in the bathtub or shower. Buildup makes bathtubs and showers slippery. Use non-skid mats or decals on the floor of the bathtub or shower. Attach bath mats securely with double-sided, non-slip rug tape. If you need to sit down in the shower, use a non-slip stool. Install grab bars by the toilet and in the bathtub and shower. Do not use towel bars as grab bars. What can I do in the bedroom? Make sure that you have a light by your bed that is easy to reach. Do not use any sheets or blankets on your bed that hang to the floor. Have a firm chair or bench with side arms that you can use for support when you get dressed. What can I do in the kitchen? Clean up any spills right away. If you need to reach something above you, use a step stool with a grab bar. Keep electrical cords out of the  way. Do not use floor polish or wax that makes floors slippery. What can I do with my stairs? Do not leave anything on the stairs. Make sure that you have a light switch at the top and the bottom of the stairs. Make sure that there are handrails on both sides of the stairs. Fix handrails that are broken or loose. Install non-slip stair treads on all your stairs if they do not have carpet. Avoid having throw rugs at the top or bottom of the stairs. Choose a carpet that does not hide the edge of the steps on the stairs. Make sure that the carpet is firmly attached to the stairs. Fix carpet that is loose or worn. What can I do on the outside of my home? Use bright outdoor lighting. Fix the edges of walkways and driveways and fix any cracks. Clear paths of anything that can make you trip, such as tools or rocks. Add color or contrast paint or tape to clearly mark and help you see anything that might make you trip as you walk through a door, such as a raised step or threshold. Trim  any bushes or trees on paths to your home. Check to see if handrails are loose or broken and that both sides of all steps have handrails. Install guardrails along the edges of any raised decks and porches. Have leaves, snow, or ice cleared regularly. Use sand, salt, or ice melter on paths if you live where there is ice and snow during the winter. Clean up any spills in your garage right away. This includes grease or oil spills. What other actions can I take? Review your medicines with your doctor. Some medicines can cause dizziness or changes in blood pressure, which increase your risk of falling. Wear shoes that: Have a low heel. Do not wear high heels. Have rubber bottoms and are closed at the toe. Feel good on your feet and fit well. Use tools that help you move around if needed. These include: Canes. Walkers. Scooters. Crutches. Ask your doctor what else you can do to help prevent falls. This may include seeing  a physical therapist to learn to do exercises to move better and get stronger. Where to find more information Centers for Disease Control and Prevention, [employer]: tonerpromos.no [employer] on Aging: baseringtones.pl National Institute on Aging: baseringtones.pl Contact a doctor if: You are afraid of falling at home. You feel weak, drowsy, or dizzy at home. You fall at home. Get help right away if you: Lose consciousness or have trouble moving after a fall. Have a fall that causes a head injury. These symptoms may be an emergency. Get help right away. Call 911. Do not wait to see if the symptoms will go away. Do not drive yourself to the hospital. This information is not intended to replace advice given to you by your health care provider. Make sure you discuss any questions you have with your health care provider. Document Revised: 03/05/2022 Document Reviewed: 03/05/2022 Elsevier Patient Education  2024 [employer]  Atrial Fibrillation Atrial fibrillation (AFib) is a type of irregular or rapid heartbeat (arrhythmia). In AFib, the top part of the heart (atria) beats in an irregular pattern. This makes the heart unable to pump blood normally and effectively. The goal of treatment is to prevent blood clots from forming, control your heart rate, or restore your heartbeat to a normal rhythm. If this condition is not treated, it can cause serious problems, such as a weakened heart muscle (cardiomyopathy) or a stroke. What are the causes? This condition is often caused by medical conditions that damage the heart's electrical system. These include: High blood pressure (hypertension). This is the most common cause. Certain heart problems or conditions, such as heart failure, coronary artery disease, heart valve problems, or heart surgery. Diabetes. Overactive thyroid  (hyperthyroidism). Chronic kidney disease. Certain lung conditions, such as emphysema, pneumonia, or COPD. Obstructive sleep apnea. In  some cases, the cause of this condition is not known. What increases the risk? This condition is more likely to develop in: Older adults. Athletes who do endurance exercise. People who have a family history of AFib. Males. People who are Caucasian. People who are obese. People who smoke or misuse alcohol . What are the signs or symptoms? Symptoms of this condition include: Fast or irregular heartbeats (palpitations). Discomfort or pain in your chest. Shortness of breath. Sudden light-headedness or weakness. Tiring easily during exercise or activity. Syncope (fainting). Sweating. In some cases, there are no symptoms. How is this diagnosed? Your health care provider may detect AFib when taking your pulse. If detected, this condition may be diagnosed with: An electrocardiogram (ECG)  to check electrical signals of the heart. An ambulatory cardiac monitor to record your heart's activity for a few days. A transthoracic echocardiogram (TTE) to create pictures of your heart. A transesophageal echocardiogram (TEE) to create even clearer pictures of your heart. A stress test to check your blood supply while you exercise. Imaging tests, such as a CT scan or chest X-ray. Blood tests. How is this treated? Treatment depends on underlying conditions and how you feel when you get AFib. This condition may be treated with: Medicines to prevent blood clots or to treat heart rate or heart rhythm problems. Electrical cardioversion to reset the heart's rhythm. A pacemaker to correct abnormal heart rhythm. Ablation to remove the heart tissue that sends abnormal signals. Left atrial appendage closure to seal the area where blood clots can form. In some cases, underlying conditions will be treated. Follow these instructions at home: Medicines Take over-the counter and prescription medicines only as told by your provider. Do not take any new medicines without talking to your provider. If you are taking  blood thinners: Talk with your provider before taking aspirin  or NSAIDs. These medicines can raise your risk of bleeding. Take your medicines as told. Take them at the same time each day. Do not do things that could hurt or bruise you. Be careful to avoid falls. Wear an alert bracelet or carry a card that says that you take blood thinners. Lifestyle Do not use any products that contain nicotine or tobacco. These products include cigarettes, chewing tobacco, and vaping devices, such as e-cigarettes. If you need help quitting, ask your provider. Eat heart-healthy foods. Talk with a food expert (dietitian) to make an eating plan that is right for you. Exercise regularly as told by your provider. Do not drink alcohol . Lose weight if you are overweight. General instructions If you have obstructive sleep apnea, manage your condition as told by your provider. Do not use diet pills unless your provider approves. Diet pills can make heart problems worse. Keep all follow-up visits. Your provider will want to check your heart rate and rhythm regularly. Contact a health care provider if: You notice a change in the rate, rhythm, or strength of your heartbeat. You are taking a blood thinner and you notice more bruising. You tire more easily when you exercise or do heavy work. You have a sudden change in weight. Get help right away if:  You have chest pain. You have trouble breathing. You have side effects of blood thinners, such as blood in your vomit, poop (stool), or pee (urine), or bleeding that does not stop. You have any symptoms of a stroke. BE FAST is an easy way to remember the main warning signs of a stroke: B - Balance. Signs are dizziness, sudden trouble walking, or loss of balance. E - Eyes. Signs are trouble seeing or a sudden change in vision. F - Face. Signs are sudden weakness or numbness of the face, or the face or eyelid drooping on one side. A - Arms. Signs are weakness or  numbness in an arm. This happens suddenly and usually on one side of the body. S - Speech.Signs are sudden trouble speaking, slurred speech, or trouble understanding what people say. T - Time. Time to call emergency services. Write down what time symptoms started. Other signs of a stroke, such as: A sudden, severe headache with no known cause. Nausea or vomiting. Seizure. These symptoms may be an emergency. Get help right away. Call 911. Do not wait to  see if the symptoms will go away. Do not drive yourself to the hospital. This information is not intended to replace advice given to you by your health care provider. Make sure you discuss any questions you have with your health care provider. Document Revised: 03/21/2022 Document Reviewed: 03/21/2022 Elsevier Patient Education  2024 Else[employer] Inc.  Heart Failure Action Plan A heart failure action plan helps you know what to do when you have symptoms of heart failure. Your action plan is a color-coded plan that lists the symptoms to watch for and indicates what actions to take. If you have symptoms in the green zone, you're doing well. If you have symptoms in the yellow zone, you're having problems. If you have symptoms in the red zone, you need medical care right away. Follow the plan that was created by you and your health care provider. Review your plan each time you visit your provider. Green zone These signs mean you're doing well and can continue what you're doing: You don't have new or worsening shortness of breath. You have very little swelling or no new swelling. Your weight is stable (no gain or loss). You have a normal activity level. You don't have chest pain or any other new symptoms. Yellow zone These signs and symptoms mean your condition may be getting worse and you should make some changes: You have trouble breathing when you're active. You have swelling in your feet or legs or have discomfort in your belly. You gain 2-3  lb (0.9-1.4 kg) in 24 hours, or 5 lb (2.3 kg) in a week. This amount may be more or less depending on your condition. You get tired easily. You have trouble sleeping. You have a dry cough. If you have any of these symptoms: Contact your provider within the next day. Your provider may adjust your medicines. Red zone These signs and symptoms mean you should get medical help right away: You have trouble breathing when resting or cannot lie flat and you need to raise your head to help you breathe. You have a dry cough that's getting worse. You have swelling or pain in your feet or legs or discomfort in your belly that's getting worse. You suddenly gain more than 2-3 lb (0.9-1.4 kg) in 24 hours, or more than 5 lb (2.3 kg) in a week. This amount may be more or less depending on your condition. You have trouble staying awake or you feel confused. You don't have an appetite. You have worsening sadness or depression. These symptoms may be an emergency. Call 911 right away. Do not wait to see if the symptoms will go away. Do not drive yourself to the hospital. Follow these instructions at home: Take medicines only as told. Eat a heart-healthy diet. Work with a dietitian to create an eating plan that's best for you. Weigh yourself each day. Your target weight is __________ lb (__________ kg). Call your provider if you gain more than __________ lb (__________ kg) in 24 hours, or more than __________ lb (__________ kg) in a week. Health care provider name: _____________________________________________________ Health care provider phone number: _____________________________________________________ Where to find more information American Heart Association: heart.org This information is not intended to replace advice given to you by your health care provider. Make sure you discuss any questions you have with your health care provider. Document Revised: 02/14/2023 Document Reviewed: 02/14/2023 Elsevier  Patient Education  2024 [employer].

## 2024-06-29 ENCOUNTER — Ambulatory Visit (INDEPENDENT_AMBULATORY_CARE_PROVIDER_SITE_OTHER): Admitting: Urology

## 2024-06-29 ENCOUNTER — Encounter: Payer: Self-pay | Admitting: Internal Medicine

## 2024-06-29 ENCOUNTER — Ambulatory Visit: Admitting: Internal Medicine

## 2024-06-29 VITALS — BP 108/71 | HR 77 | Ht 64.5 in | Wt 212.0 lb

## 2024-06-29 VITALS — BP 116/74 | HR 71 | Temp 98.0°F | Ht 64.5 in | Wt 212.0 lb

## 2024-06-29 DIAGNOSIS — N3946 Mixed incontinence: Secondary | ICD-10-CM

## 2024-06-29 DIAGNOSIS — T8740 Infection of amputation stump, unspecified extremity: Secondary | ICD-10-CM | POA: Insufficient documentation

## 2024-06-29 DIAGNOSIS — Z89512 Acquired absence of left leg below knee: Secondary | ICD-10-CM

## 2024-06-29 DIAGNOSIS — E1122 Type 2 diabetes mellitus with diabetic chronic kidney disease: Secondary | ICD-10-CM

## 2024-06-29 DIAGNOSIS — E1142 Type 2 diabetes mellitus with diabetic polyneuropathy: Secondary | ICD-10-CM

## 2024-06-29 DIAGNOSIS — N1832 Chronic kidney disease, stage 3b: Secondary | ICD-10-CM | POA: Diagnosis not present

## 2024-06-29 MED ORDER — CEPHALEXIN 500 MG PO CAPS: 500.0000 mg | ORAL_CAPSULE | Freq: Two times a day (BID) | ORAL | 0 refills | Status: DC

## 2024-06-29 MED ORDER — FREESTYLE LIBRE 3 PLUS SENSOR MISC: 3 refills | Status: DC

## 2024-06-29 NOTE — Assessment & Plan Note (Signed)
-   This problem is chronic and stable -Patient had recent lab work done at the end of November with creatinine stable at 1.3 and GFR of 42 consistent with CKD stage IIIb -Continue to follow-up with nephrology -Continue with finenerone - No further workup at this time

## 2024-06-29 NOTE — Assessment & Plan Note (Signed)
-   This problem is chronic and stable -Continue with management per endocrinology -Patient was recently switched from Farxiga  to Januvia  and is unsure why this change occurred.  She was encouraged to contact her endocrinologist to clarify her medication management -Continue with insulin  per endocrinology including degludec as well as NovoLog  No further workup at this time-

## 2024-06-29 NOTE — Assessment & Plan Note (Signed)
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

## 2024-06-29 NOTE — Patient Instructions (Addendum)
°  VISIT SUMMARY: KEMYA SHED, a 78 year old female, visited for follow-up care and home health orders. She has ongoing issues with an ocular infection, stump infection, diabetes management, sleep disturbances, and mobility limitations.  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 We have prescribed antibiotics to treat the infection and referred you to a vascular surgeon for further evaluation. Home health services will be arranged for physical therapy to aid in your recovery.  -TYPE 2 DIABETES MELLITUS WITH STAGE 3 CHRONIC KIDNEY DISEASE: You have type 2 diabetes and stage 3 chronic kidney disease. Your recent medication change from Farxiga  to Januvia  may be due to concerns about your kidney function. Your A1c level is 7.6, indicating reasonable control of your diabetes.  Please contact your endocrinologist to clarify your medication regimen and we have ordered Libre 3 sensors for continuous glucose monitoring.   INSTRUCTIONS: Please follow up with the vascular surgeon as soon as possible for your stump infection. Continue using the prescribed antibiotics and monitor your blood glucose levels with the Libre 3 sensors once they arrive. We will contact your endocrinologist to review your diabetes medication regimen. If your pain continues to disrupt your sleep, let us  know so we can discuss additional treatment options.                      Contains text generated by [employer].                                 Contains text generated by [employer].

## 2024-06-29 NOTE — Progress Notes (Signed)
 06/29/2024 9:54 AM   Denise Sharp 01-25-1946 [id number]  Referring provider: Marylynn Verneita CROME, MD [address] [place],  [place] [address]  Chief Complaint  Patient presents with   Mixed incontinence    HPI: I placed an InterStim device September 18, 2021.  I had removed an old device.  The case took a long time and actually tried both sides to get finally good responses.  The lead was placed in the same position but in my opinion improved versus the initial InterStim lead.   The patient had presented with high-volume urge incontinence wearing 6-8 pads a day with flooding foot on the floor syndrome in the middle of the night.  She wanted an MRI compatible device as well.  She had been dry on the InterStim years back.   Little to no pain. Incisions look great.  She is completely continent and very happy     Excellent bladder control.  In the last 3-week she has had some left-sided pain and she wondered if it could be due to her InterStim.  Clinically not infected   Patient is urine looked infected.  She had a negative CT scan in December 2022.  She had a negative cystoscopy by Dr. Penne in August 2022.  This is being done for blood in the urine.  She had a positive culture on that day.  She had to be given fosfomycin .   Patient might have a bladder infection.  Reassurance given about InterStim.  Call if culture positive.  See the patient in 1 year.  Called in ciprofloxacin  250 mg twice a day for 7 days   I saw the patient 1 year ago.  She has been seen multiple times by our nurse practitioner last time being October 2023.  Had a positive urine culture July 2023 when I saw her and since then had yeast in the urine once.  The InterStim procedure was in March 2023.  I reviewed the operative note and other notes prior.  It was a challenging procedure as noted.  She in the past has had uncontrolled diabetes.  She has been offered vaginal estrogen cream for recurrent UTIs    Patient was recently admitted in September 2024 for acute on chronic congestive heart failure.  She may have gained as much is 50 pounds in weight and has been switched from Lasix  to a stronger diuretic.  She has chronic renal insufficiency diabetes atrial fibrillation sleep apnea obesity and other cardiac issues. She wears 4-6 pads a day that can be very wet with urge incontinence especially when she goes from sitting to standing position.  She has not been changing the settings of the InterStim device.  She also wears pads for complete loss control of bowels with diarrhea and fecal incontinence.  She said her sugar control varies.  She does not think she has had bladder infections in the last 6 months.     I think we need to have reasonable treatment goals.  If this urine culture is positive I will put her on urinary prophylaxis at least for a while but otherwise treat positive cultures as needed.  I will have the patient see the [employer] representative as she is seeing our nurse practitioner a number of times.  I do not think replacing or changing InterStim is the answer based upon the previous operative experience and difficulties, patient's medical issues, and risk factors.  Botox would be an option but she would be at high  risk of retention.  Percutaneous tibial nerve stimulation an option.  I had initially thought years ago she was not a good candidate for refractory therapies but the InterStim was placed in [place] in 2015 and she actually did quite well.  It was reasonable to try Gemtesa  in the meantime   Patient is already lost 36 pounds in fluid.  She agreed with the above treatment plan.  I gave her the Gemtesa , sent urine for culture and will have the rep see her   Today I last saw the patient in September 2024.[name] physician assistant many times.  She had been seen by the nurse practitioner in 2023 several times.  I do not see where she saw the [employer] representative.  Culture in  September was negative   Does not reduce some of the urgency but only minimal benefit.  She can sit for 8 hours but since she stands up her feet hit the floor she has high-volume soaking incontinence.  She gained another 15 pounds recently he is going to the cardiac center today.  Hemoglobin A1c now is over 9 from 7.  She has to have her rings taken off due to fluid retention     Call if urine culture positive. Patient understands that it will be very difficult to control her incontinence with her fluid situation and diabetes and other medical comorbidities and she understood. I am not going to push therapy further. Reassess in 6 months    Today Frequency stable  Last c/s normal Computer issues Still lasix  60 mg and fluid shifts; gout; neuropathy; on eliquis  sugars better controlled post recent prensone Swollen left leg Still high volume UUI Spoke re PTNS and Botox     PTNS started; botox not ideal on blood thinner; will not be offering repeat SNM   June 29, 2024 Patient still has urge incontinence but she thinks PTNS has helped.  No longer taking a high dose diuretic but of course may need to start it in the future based upon past experiences.  She asked me again if changing the InterStim would make a difference and in my opinion this is not the answer.   PMH: Past Medical History:  Diagnosis Date   (HFpEF) heart failure with preserved ejection fraction (HCC)    a. 12/2019 Echo: EF 65-70%, Gr2 DD. No significant valvular dzs.   Abnormality of gait 03/25/2013   Adrenal mass, left    Anginal pain    Anxiety    Aortic atherosclerosis    Arthritis    Asthma    CAD (coronary artery disease) with h/o Atypical Chest Pain    a. 04/1986 Cath ([facility]): nl cors, EF 65%; b. 07/2012 Cath [facility]): Diff minor irregs; c. 07/2016 MV [facility]): Equivocal; d. 08/2016 Cardiac CT Ca2+ score [facility]): Ca2+ 1548; e. 05/2019 [facility]: No ischemia. EF 75%; f. 12/2019 PCI: LM nl, LAD 65ost (3.5x12 Resolute DES), 72m  (2.75x12 Resolute DES),LCX/RCA nl; g. 08/2020 Cath: patent LAD stents, RCA 40ost, elev RH pressures, EF >65%.   Cervical spinal stenosis 1994   due to trauma to back ([employer] accident), has intermittent paralysis and parasthesias   Cervicogenic headache 03/23/2014   CKD (chronic kidney disease), stage III (HCC)    Depression    Diverticulosis    Dizziness    a.) chronic   DJD (degenerative joint disease)    a. Chronic R shoulder pain   Dyspnea    Esophageal stenosis 09/2009   a.) transient outlet obstruction by  food, cleared by EGD   Family history of adverse reaction to anesthesia    a.) daughter with (+) PONV   Gastric bypass status for obesity    GERD (gastroesophageal reflux disease)    Gout    Headache(784.0)    HLD (hyperlipidemia)    Hypertension    IBS (irritable bowel syndrome)    IDA (iron  deficiency anemia)    a.) post 2 unit txfsn 2009, normal endo/colonoscopy by [facility]   Left bundle branch block (LBBB)    a.) Intermittently present - likely rate related.   NSVT (nonsustained ventricular tachycardia) (HCC) 11/15/2020   a.) Holter study 11/15/2020; 11 episodes of NSVT lasting up to 8 beats with a maximum rate of 210 bpm; 14 atrial runs lasting up to 18 beats with a rate of up to 250 bpm.  Some atrial runs felt to be NSVT.   NSVT (nonsustained ventricular tachycardia) (HCC)    a. 10/2020 [facility]: 11 runs of NSVT up to 8 beats.   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 10/2020 [facility]: 14 atrial runs up to 18 beats, max HR 250.   Restless leg syndrome    Rotator cuff arthropathy, right 08/13/2017   Syncope and collapse 03/12/2014   Type II diabetes mellitus (HCC)     Surgical History: Past Surgical History:  Procedure Laterality Date   AMPUTATION Left 03/07/2024   Procedure: AMPUTATION BELOW KNEE;  Surgeon: Tisa Curry LABOR, MD;   Location: [facility];  Service: Vascular;  Laterality: Left;   APPENDECTOMY     BACK SURGERY     CARDIOVASCULAR STRESS TEST     CARDIOVERSION N/A 10/30/2022   Procedure: CARDIOVERSION;  Surgeon: Mady Bruckner, MD;  Location: [facility];  Service: Cardiovascular;  Laterality: N/A;   CHOLECYSTECTOMY N/A 1976   COLONOSCOPY     CORONARY ANGIOPLASTY     CORONARY STENT INTERVENTION N/A 01/04/2020   Procedure: CORONARY STENT INTERVENTION;  Surgeon: Darron Deatrice LABOR, MD;  Location: [facility];  Service: Cardiovascular;  Laterality: N/A;  LAD    DIAPHRAGMATIC HERNIA REPAIR  2015   ESOPHAGEAL DILATION     multiple   ESOPHAGOGASTRODUODENOSCOPY (EGD) WITH PROPOFOL  N/A 05/11/2021   Procedure: ESOPHAGOGASTRODUODENOSCOPY (EGD) WITH PROPOFOL ;  Surgeon: Jinny Carmine, MD;  Location: [facility];  Service: Endoscopy;  Laterality: N/A;   ESOPHAGOGASTRODUODENOSCOPY (EGD) WITH PROPOFOL  N/A 06/13/2021   Procedure: ESOPHAGOGASTRODUODENOSCOPY (EGD) WITH PROPOFOL ;  Surgeon: Jinny Carmine, MD;  Location: [facility];  Service: Endoscopy;  Laterality: N/A;   ESOPHAGOGASTRODUODENOSCOPY (EGD) WITH PROPOFOL  N/A 02/28/2023   Procedure: ESOPHAGOGASTRODUODENOSCOPY (EGD) WITH PROPOFOL ;  Surgeon: Jinny Carmine, MD;  Location: [facility];  Service: Endoscopy;  Laterality: N/A;   EYE SURGERY     GASTRIC BYPASS  2000, 2005   Dr. Mia REDBIRD IMPLANT PLACEMENT  10/2011   Ike REDBIRD IMPLANT PLACEMENT N/A 09/18/2021   Procedure: REDBIRD IMPLANT FIRST STAGE;  Surgeon: Gaston Hamilton, MD;  Location: [facility];  Service: Urology;  Laterality: N/A;   INTERSTIM IMPLANT PLACEMENT N/A 09/18/2021   Procedure: REDBIRD IMPLANT SECOND STAGE WITH IMPEDENCE CHECK;  Surgeon: Gaston Hamilton, MD;  Location: [facility];  Service: Urology;  Laterality: N/A;   INTERSTIM IMPLANT REMOVAL N/A 09/18/2021   Procedure: REMOVAL OF INTERSTIM IMPLANT;  Surgeon: Gaston Hamilton, MD;  Location: [facility];  Service:  Urology;  Laterality: N/A;   LEFT HEART CATH AND CORONARY ANGIOGRAPHY N/A 04/1986   LEFT HEART CATH AND CORONARY ANGIOGRAPHY Left 07/24/2012   LEFT HEART CATH AND CORS/GRAFTS ANGIOGRAPHY N/A 12/31/2019   Procedure: LEFT HEART CATH AND CORS/GRAFTS ANGIOGRAPHY;  Surgeon: Perla Evalene PARAS, MD;  Location: [facility];  Service: Cardiovascular;  Laterality: N/A;   PANNICULECTOMY N/A 06/16/2019   Procedure: PANNICULECTOMY;  Surgeon: Elisabeth Craig RAMAN, MD;  [facility];  Service: Plastics;  Laterality: N/A;  3 hours, please   REVERSE SHOULDER ARTHROPLASTY Right 08/13/2017   Procedure: REVERSE RIGHT SHOULDER ARTHROPLASTY;  Surgeon: Josefina Chew, MD;  Location: [facility];  Service: Orthopedics;  Laterality: Right;   RIGHT/LEFT HEART CATH AND CORONARY ANGIOGRAPHY N/A 09/06/2020   Procedure: RIGHT/LEFT HEART CATH AND CORONARY ANGIOGRAPHY;  Surgeon: Mady Bruckner, MD;  Location: [facility];  Service: Cardiovascular;  Laterality: N/A;   RIGHT/LEFT HEART CATH AND CORONARY ANGIOGRAPHY N/A 06/04/2022   Procedure: RIGHT/LEFT HEART CATH AND CORONARY ANGIOGRAPHY;  Surgeon: Mady Bruckner, MD;  [facility];  Service: Cardiovascular;  Laterality: N/A;   ROTATOR CUFF REPAIR Right    SPINE SURGERY  1995   [place]   TOOTH EXTRACTION  11/13/2021   TOTAL ABDOMINAL HYSTERECTOMY W/ BILATERAL SALPINGOOPHORECTOMY  1974   TOTAL KNEE ARTHROPLASTY Bilateral 2007   Procedure: TOTAL KNEE ARTHROPLASTY; Surgeons: Kathi, MD and [name], MD   UMBILICAL HERNIA REPAIR  08/11/2015    Home Medications:  Allergies as of 06/29/2024       Reactions   Amoxicillin  Diarrhea   amoxicillin    Clarithromycin  Nausea And Vomiting, Other (See Comments)   Severe irritable bowel clarithromycin    Demeclocycline Hives   Erythromycin Nausea And Vomiting, Other (See Comments)   Severe irritable bowel   Flagyl [metronidazole] Nausea And Vomiting, Other (See Comments)   Severe irritable bowel    Glucophage [metformin Hcl] Nausea And Vomiting, Other (See Comments)   Sick I won't take anything that has metformin in it   Tetracyclines & Related Hives, Rash   Potassium Chloride  Other (See Comments)   Cannot swallow potssium pills, able to tolerate liquid potassium   Tetracycline Other (See Comments)   Diovan [valsartan] Nausea Only       Sulfa  Antibiotics Other (See Comments), Rash   As child   Xanax [alprazolam] Other (See Comments)   Hyperactivity         Medication List        Accurate as of June 29, 2024  9:54 AM. If you have any questions, ask your nurse or doctor.          acetaminophen  325 MG tablet Commonly known as: TYLENOL  Take 2 tablets (650 mg total) by mouth every 6 (six) hours as needed for mild pain (pain score 1-3).   allopurinol  100 MG tablet Commonly known as: ZYLOPRIM  Take 100 mg by mouth daily.   atorvastatin  80 MG tablet Commonly known as: LIPITOR  Take 80 mg by mouth daily.   budesonide -formoterol  160-4.5 MCG/ACT inhaler Commonly known as: SYMBICORT  Inhale 2 puffs into the lungs 2 (two) times daily.   cetirizine 10 MG tablet Commonly known as: ZYRTEC Take 10 mg by mouth daily.   clotrimazole -betamethasone  cream Commonly known as: LOTRISONE  Apply 1 application topically 2 (two) times daily as needed (irritation).   colchicine  0.6 MG tablet Take 0.6 mg by mouth daily.   dicyclomine  20 MG tablet Commonly known as: BENTYL  Take 1 tablet (20 mg total) by mouth 3 (three) times daily  before meals.   Droplet Pen Needles 31G X 5 MM Misc Generic drug: Insulin  Pen Needle USE ONE NEEDLE SUBCUTANEOUSLY AS DIRECTED (REMOVE AND DISCARD NEEDLE IN SHARPS CONTAINER IMMEDIATELY AFTER USE)   DULoxetine  60 MG capsule Commonly known as: CYMBALTA  Take 60 mg by mouth daily.   Eliquis  5 MG Tabs tablet Generic drug: apixaban  Take 5 mg by mouth 2 (two) times daily.   ezetimibe  10 MG tablet Commonly known as: ZETIA  Take 10 mg by mouth  daily. What changed: additional instructions   fluconazole  100 MG tablet Commonly known as: DIFLUCAN  Take 1 tablet (100 mg total) by mouth daily. X 7 days   fluticasone  50 MCG/ACT nasal spray Commonly known as: FLONASE  Place 2 sprays into both nostrils daily.   FreeStyle Libre 3 Plus Sensor Misc Change sensor every 15 days.   Gvoke HypoPen  2-Pack 0.5 MG/0.1ML Soaj Generic drug: Glucagon  Inject 1 auto injector subcutaneously as needed for severe hypoglycemia. May repeat x 1 after 15 minutes if needed.   insulin  degludec 100 UNIT/ML FlexTouch Pen Commonly known as: TRESIBA  Inject 15 Units into the skin at bedtime.   Kerendia  10 MG Tabs Generic drug: Finerenone  Take 1 tablet (10 mg total) by mouth in the morning.   levothyroxine  25 MCG tablet Commonly known as: SYNTHROID  Take 25 mcg by mouth daily before breakfast.   lidocaine  5 % Commonly known as: LIDODERM  Place 1 patch onto the skin daily. Remove & Discard patch within 12 hours or as directed by MD   loperamide  2 MG capsule Commonly known as: IMODIUM  Take 4-6 mg by mouth 3 (three) times daily before meals. What changed: additional instructions   midodrine 5 MG tablet Commonly known as: PROAMATINE Take 5 mg by mouth as needed.   nitroGLYCERIN  0.4 MG SL tablet Commonly known as: NITROSTAT  Place 0.4 mg under the tongue every 5 (five) minutes as needed for chest pain.   NovoLOG  FlexPen 100 UNIT/ML FlexPen Generic drug: insulin  aspart Inject 8-14 Units into the skin 3 (three) times daily with meals. 90-150= 8 units; 151-200=9 units; 201-250=10 units; 251-300= 11 units; 301-350= 12 units; 351-400=13 units; 401 or higher =14 units   nystatin  powder Commonly known as: MYCOSTATIN /NYSTOP  Apply 1 g topically 4 (four) times daily as needed (rash).   ondansetron  4 MG tablet Commonly known as: ZOFRAN  Take 4 mg by mouth every 8 (eight) hours as needed for nausea or vomiting.   OneTouch Delica Lancets 33G Misc Used to  check blood sugar two times a day.   OneTouch Verio test strip Generic drug: glucose blood Use to check blood sugar twice daily.   oxycodone  5 MG capsule Commonly known as: OXY-IR Take 5 mg by mouth every 4 (four) hours as needed. 2.5 mg as needed every 6 hours   pantoprazole  40 MG tablet Commonly known as: PROTONIX  Take 40 mg by mouth daily.   phenylephrine -shark liver oil-mineral oil-petrolatum 0.25-14-74.9 % rectal ointment Commonly known as: PREPARATION H Place 1 Application rectally 2 (two) times daily as needed for hemorrhoids (prn every 8 hours).   pregabalin  50 MG capsule Commonly known as: LYRICA  Take 1 capsule (50 mg total) by mouth 2 (two) times daily.   Premarin  vaginal cream Generic drug: conjugated estrogens  Apply one pea-sized amount around the opening of the urethra daily for 2 weeks, then 3 times weekly moving forward.   sitaGLIPtin  50 MG tablet Commonly known as: JANUVIA  Take 50 mg by mouth daily.   sitaGLIPtin  50 MG tablet Commonly known as: Januvia  Take  1 tablet (50 mg total) by mouth daily.   SYSTANE OP Place 1 drop into both eyes 4 (four) times daily as needed (dry eyes).   torsemide  20 MG tablet Commonly known as: DEMADEX  Take 20 mg by mouth daily.   trolamine salicylate 10 % cream Commonly known as: ASPERCREME Apply 1 Application topically daily.   Vitamin D3 1.25 MG (50000 UT) Caps Take 5,000 Units by mouth daily.        Allergies: Allergies[1]  Family History: Family History  Problem Relation Age of Onset   Heart disease Father    Hypertension Father    Prostate cancer Father    Stroke Father    Osteoporosis Father    Stroke Mother    Depression Mother    Headache Mother    Heart disease Mother    Thyroid  disease Mother    Hypertension Mother    Diabetes Daughter    Heart disease Daughter    Hypertension Daughter    Hypertension Son     Social History:  reports that she has never smoked. She has never been exposed to  tobacco smoke. She has never used smokeless tobacco. She reports that she does not currently use drugs. She reports that she does not drink alcohol .  ROS:                                        Physical Exam: BP 108/71   Pulse 77   Ht 5' 4.5 (1.638 m)   Wt 96.2 kg   SpO2 97%   BMI 35.83 kg/m   Constitutional:  Alert and oriented, No acute distress. HEENT: [place] AT, moist mucus membranes.  Trachea midline, no masses.  Laboratory Data: Lab Results  Component Value Date   WBC 12.5 (H) 05/19/2024   HGB 12.1 05/19/2024   HCT 38.9 05/19/2024   MCV 94.9 05/19/2024   PLT 304 05/19/2024    Lab Results  Component Value Date   CREATININE 1.44 (H) 05/19/2024    No results found for: PSA  No results found for: TESTOSTERONE  Lab Results  Component Value Date   HGBA1C 7.6 (A) 01/07/2024    Urinalysis    Component Value Date/Time   COLORURINE YELLOW (A) 03/10/2024 1306   APPEARANCEUR Cloudy (A) 04/07/2024 1405   LABSPEC 1.012 03/10/2024 1306   LABSPEC 1.006 06/21/2014 1623   PHURINE 5.0 03/10/2024 1306   GLUCOSEU 2+ (A) 04/07/2024 1405   GLUCOSEU 500 (A) 02/17/2021 1008   HGBUR SMALL (A) 03/10/2024 1306   BILIRUBINUR Negative 04/07/2024 1405   BILIRUBINUR Negative 06/21/2014 1623   KETONESUR 5 (A) 03/10/2024 1306   PROTEINUR Trace 04/07/2024 1405   PROTEINUR 30 (A) 03/10/2024 1306   UROBILINOGEN 0.2 02/17/2021 1008   NITRITE Negative 04/07/2024 1405   NITRITE NEGATIVE 03/10/2024 1306   LEUKOCYTESUR 2+ (A) 04/07/2024 1405   LEUKOCYTESUR NEGATIVE 03/10/2024 1306   LEUKOCYTESUR Negative 06/21/2014 1623    Pertinent Imaging:   Assessment & Plan: Modest response to PTNS.  Continue with it.  I think we need to have reasonable treatment goals.  I do not think she should have a repeat InterStim for medical reasons mentioned above  1. Mixed incontinence (Primary)  - Urinalysis, Complete   No follow-ups on file.  Glendia DELENA Elizabeth,  MD  [facility] [address] [place], [place] [address] [phone]     [  1]  Allergies Allergen Reactions   Amoxicillin  Diarrhea    amoxicillin    Clarithromycin  Nausea And Vomiting and Other (See Comments)    Severe irritable bowel  clarithromycin    Demeclocycline Hives   Erythromycin Nausea And Vomiting and Other (See Comments)    Severe irritable bowel   Flagyl [Metronidazole] Nausea And Vomiting and Other (See Comments)    Severe irritable bowel   Glucophage [Metformin Hcl] Nausea And Vomiting and Other (See Comments)    Sick I won't take anything that has metformin in it   Tetracyclines & Related Hives and Rash   Potassium Chloride  Other (See Comments)    Cannot swallow potssium pills, able to tolerate liquid potassium   Tetracycline Other (See Comments)   Diovan [Valsartan] Nausea Only        Sulfa  Antibiotics Other (See Comments) and Rash    As child   Xanax [Alprazolam] Other (See Comments)    Hyperactivity

## 2024-06-29 NOTE — Assessment & Plan Note (Signed)
-   Patient with drainage from her left stump with some surrounding erythema and increased local warmth consistent with a likely infection -Will start the patient on Keflex  -Office attempt to contact [place] for more urgent follow-up for infection -Will continue to monitor closely -Patient to follow-up with us  in 1 week to ensure resolution

## 2024-06-29 NOTE — Assessment & Plan Note (Signed)
-   Patient with a BMI greater than 35 and has diabetes associated with her obesity -Patient currently has issues with ambulation and will work on this with physical therapy but once she is able to ambulate and get the prosthesis we will be able to exercise more -Will continue to monitor

## 2024-06-29 NOTE — Progress Notes (Signed)
 Office Visit  Subjective:     Patient ID: Denise Sharp, female    DOB: Aug 15, 1945, 78 y.o.   MRN: [id number]  Chief Complaint  Patient presents with   Hospitalization Follow-up    Hospital follow up for left lower limb amputation    Discussed the use of AI scribe software for clinical note transcription with the patient, who gave verbal consent to proceed.  History of Present Illness Denise Sharp is a 78 year old female who presents for follow-up care and home health order.  Ocular infection and periorbital skin irritation - Persistent eye infection initially treated with regular eye drops, which were ineffective - Steroid eye drops and topical eye cream prescribed by eye specialist - Eye drops provide temporary relief; symptoms recur after drops wear off - Severe ocular discomfort, described as feeling the need to claw out from behind the eyes - Discharge from eyes has caused skin irritation and blistering on the face  Stump infection and wound care - History of stump infection following amputation - Since returning home from nursing facility, stump has been infected twice - Most recent drainage occurred last Friday - Neosporin applied to area with some improvement, but wound not fully healed - Infection has persisted since August, preventing prosthesis fitting  Diabetes mellitus and glycemic control - Unstable blood glucose levels, particularly at night - Wakes up when alarm goes off due to blood sugar fluctuations - Previously on Farxiga , switched to Januvia  while in nursing home; reason for change unknown - Inconsistent diabetes management during nursing home stay - Requires Libre 3 sensors for continuous glucose monitoring; current supply will run out before Christmas  Sleep disturbance and neuropathic pain - Difficulty sleeping, described as not sleeping at all - Sleep disturbance attributed to pain and anxiety - Neuropathy pain characterized by frequent  leg jerks rather than phantom pain - Hydrocodone  prescribed for pain; only a few pills left and reluctant to use long-term  Mobility limitation and functional status - Unable to stand on right leg, resulting in impaired mobility and difficulty performing daily activities - Limited home health support, approximately five hours per week - Considering long-term care placement due to mobility limitations and inadequate home support - Requires private room in facility due to medical needs; [facility] is the only local facility accepting [employer]    Review of Systems  Constitutional: Negative.   Eyes:  Positive for discharge.  Respiratory: Negative.    Cardiovascular: Negative.   Gastrointestinal: Negative.   Musculoskeletal:        Drainage from left BKA stump with some surrounding redness over the area and mild warmth  Skin: Negative.   Neurological: Negative.   Psychiatric/Behavioral: Negative.          Objective:    BP 116/74   Pulse 71   Temp 98 F (36.7 C)   Ht 5' 4.5 (1.638 m)   Wt 212 lb (96.2 kg)   SpO2 98%   BMI 35.83 kg/m    Physical Exam Constitutional:      Appearance: Normal appearance.  HENT:     Head: Normocephalic and atraumatic.  Cardiovascular:     Rate and Rhythm: Normal rate and regular rhythm.     Heart sounds: Normal heart sounds.  Pulmonary:     Effort: Pulmonary effort is normal.     Breath sounds: Normal breath sounds. No wheezing, rhonchi or rales.  Abdominal:     General: Bowel sounds are normal.  There is no distension.     Palpations: Abdomen is soft.     Tenderness: There is no abdominal tenderness. There is no guarding or rebound.  Musculoskeletal:     Right lower leg: No edema.     Left lower leg: No edema.     Comments: Left BKA stump with drainage, erythema and mild increased warmth.  No tenderness noted  Neurological:     Mental Status: She is alert.  Psychiatric:        Mood and Affect: Mood normal.         Behavior: Behavior normal.     No results found for any visits on 06/29/24.      Assessment & Plan:   Problem List Items Addressed This Visit       Endocrine   CKD stage 3 due to type 2 diabetes mellitus (HCC) - Primary   - This problem is chronic and stable -Patient had recent lab work done at the end of November with creatinine stable at 1.3 and GFR of 42 consistent with CKD stage IIIb -Continue to follow-up with nephrology -Continue with finenerone - No further workup at this time      DM type 2 with diabetic peripheral neuropathy (HCC)   - This problem is chronic and stable -Continue with management per endocrinology -Patient was recently switched from Farxiga  to Januvia  and is unsure why this change occurred.  She was encouraged to contact her endocrinologist to clarify her medication management -Continue with insulin  per endocrinology including degludec as well as NovoLog  No further workup at this time-      Relevant Medications   Continuous Glucose Sensor (FREESTYLE LIBRE 3 PLUS SENSOR) MISC     Other   Amputation stump infection (HCC)   - Patient with drainage from her left stump with some surrounding erythema and increased local warmth consistent with a likely infection -Will start the patient on Keflex  -Office attempt to contact  vein and vascular for more urgent follow-up for infection -Will continue to monitor closely -Patient to follow-up with us  in 1 week to ensure resolution      Relevant Medications   cephALEXin  (KEFLEX ) 500 MG capsule   Other Relevant Orders   Ambulatory referral to Home Health   Morbid obesity (HCC)   - Patient with a BMI greater than 35 and has diabetes associated with her obesity -Patient currently has issues with ambulation and will work on this with physical therapy but once she is able to ambulate and get the prosthesis we will be able to exercise more -Will continue to monitor      Status post below-knee amputation  of left lower extremity (HCC)   -Patient had recent left BKA done in August and has had recurrent stump infection since then -She has had difficulty ambulating and is unable to bear weight on her right lower extremity -Referral placed for home health for nursing as well as PT and OT -Patient does have an infection of her stump we have started her on antibiotics for but will need close follow-up with  nursing to ensure resolution -Patient also like placement for long-term care and we have put in a social work referral for this -Patient will need continued PT and OT to help with muscle strengthening and gait training so that she can get a prosthesis to help with her ambulation      Relevant Orders   Ambulatory referral to Home Health    Meds ordered this encounter  Medications   cephALEXin  (KEFLEX ) 500 MG capsule    Sig: Take 1 capsule (500 mg total) by mouth 2 (two) times daily.    Dispense:  14 capsule    Refill:  0   Continuous Glucose Sensor (FREESTYLE LIBRE 3 PLUS SENSOR) MISC    Sig: Change sensor every 15 days.    Dispense:  6 each    Refill:  3    No follow-ups on file.  Nasirah Sachs, MD

## 2024-06-30 ENCOUNTER — Telehealth (INDEPENDENT_AMBULATORY_CARE_PROVIDER_SITE_OTHER): Payer: Self-pay | Admitting: Vascular Surgery

## 2024-06-30 NOTE — Telephone Encounter (Signed)
 Called patient (per call from Dr. Lula office) to schedule appointment. Patient's stump seems to be infected and not healing. States it was yellow and now it's black. Sunday her leg hurt so bad. Offered pt to come tomorrow at 9am, but pt states due to her husband's medical issues it's hard for them to come in before 10am. Scheduled patient next available for Friday, 07/03/24 at 1:30pm. Please advise if this date it ok given diagnosis

## 2024-07-01 NOTE — Telephone Encounter (Signed)
 Friday is fine

## 2024-07-02 ENCOUNTER — Telehealth: Payer: Self-pay

## 2024-07-02 NOTE — Telephone Encounter (Signed)
 Copied from CRM #[id number]. Topic: Clinical - Home Health Verbal Orders >> Jul 02, 2024  8:51 AM Rea ORN wrote: Caller/Agency: [facility] Callback Number: [phone] Service Requested: Occupational Therapy Frequency: once a week for 6 weeks Any new concerns about the patient? Yes. Pt had an amputation and they are working on toileting at the bedside commode. Her current commode is a standard and she can not navigate her sliding board on to it. Jori is asking for rx for drop arm bedside commode for pt. Please send the order to [facility] [phone] unless PCP has another DME company she would like to use.

## 2024-07-02 NOTE — Progress Notes (Deleted)
 Cardiology Office Note    Date:  07/02/2024   ID:  Denise Sharp, DOB [birth year], MRN [id number]  PCP:  Denise Verneita CROME, MD  Cardiologist:  Lonni Hanson, MD  Electrophysiologist:  None   Chief Complaint: ***  History of Present Illness:   Denise Sharp is a 78 y.o. female with history of ***  ***   Labs independently reviewed: ***  Past Medical History:  Diagnosis Date   (HFpEF) heart failure with preserved ejection fraction (HCC)    a. 12/2019 Echo: EF 65-70%, Gr2 DD. No significant valvular dzs.   Abnormality of gait 03/25/2013   Adrenal mass, left    Anginal pain    Anxiety    Aortic atherosclerosis    Arthritis    Asthma    CAD (coronary artery disease) with h/o Atypical Chest Pain    a. 04/1986 Cath ([facility]): nl cors, EF 65%; b. 07/2012 Cath [facility]): Diff minor irregs; c. 07/2016 MV [facility]): Equivocal; d. 08/2016 Cardiac CT Ca2+ score [facility]): Ca2+ 1548; e. 05/2019 MV: No ischemia. EF 75%; f. 12/2019 PCI: LM nl, LAD 65ost (3.5x12 Resolute DES), 70m (2.75x12 Resolute DES),LCX/RCA nl; g. 08/2020 Cath: patent LAD stents, RCA 40ost, elev RH pressures, EF >65%.   Cervical spinal stenosis 1994   due to trauma to back ([employer] accident), has intermittent paralysis and parasthesias   Cervicogenic headache 03/23/2014   CKD (chronic kidney disease), stage III (HCC)    Depression    Diverticulosis    Dizziness    a.) chronic   DJD (degenerative joint disease)    a. Chronic R shoulder pain   Dyspnea    Esophageal stenosis 09/2009   a.) transient outlet obstruction by food, cleared by EGD   Family history of adverse reaction to anesthesia    a.) daughter with (+) PONV   Gastric bypass status for obesity    GERD (gastroesophageal reflux disease)    Gout    Headache(784.0)    HLD (hyperlipidemia)    Hypertension    IBS (irritable bowel syndrome)    IDA (iron  deficiency anemia)    a.) post 2 unit txfsn 2009, normal endo/colonoscopy by [facility]   Left bundle branch block  (LBBB)    a.) Intermittently present - likely rate related.   NSVT (nonsustained ventricular tachycardia) (HCC) 11/15/2020   a.) Holter study 11/15/2020; 11 episodes of NSVT lasting up to 8 beats with a maximum rate of 210 bpm; 14 atrial runs lasting up to 18 beats with a rate of up to 250 bpm.  Some atrial runs felt to be NSVT.   NSVT (nonsustained ventricular tachycardia) (HCC)    a. 10/2020 [facility]: 11 runs of NSVT up to 8 beats.   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 10/2020 [facility]: 14 atrial runs up to 18 beats, max HR 250.   Restless leg syndrome    Rotator cuff arthropathy, right 08/13/2017   Syncope and collapse 03/12/2014   Type II diabetes mellitus (HCC)     Past Surgical History:  Procedure Laterality Date   AMPUTATION Left 03/07/2024   Procedure: AMPUTATION BELOW KNEE;  Surgeon: Tisa Curry LABOR, MD;  Location: [facility];  Service: Vascular;  Laterality: Left;   APPENDECTOMY     BACK SURGERY     CARDIOVASCULAR STRESS TEST     CARDIOVERSION N/A  10/30/2022   Procedure: CARDIOVERSION;  Surgeon: Mady Bruckner, MD;  Location: [facility];  Service: Cardiovascular;  Laterality: N/A;   CHOLECYSTECTOMY N/A 1976   COLONOSCOPY     CORONARY ANGIOPLASTY     CORONARY STENT INTERVENTION N/A 01/04/2020   Procedure: CORONARY STENT INTERVENTION;  Surgeon: Darron Deatrice LABOR, MD;  Location: [facility];  Service: Cardiovascular;  Laterality: N/A;  LAD    DIAPHRAGMATIC HERNIA REPAIR  2015   ESOPHAGEAL DILATION     multiple   ESOPHAGOGASTRODUODENOSCOPY (EGD) WITH PROPOFOL  N/A 05/11/2021   Procedure: ESOPHAGOGASTRODUODENOSCOPY (EGD) WITH PROPOFOL ;  Surgeon: Jinny Carmine, MD;  Location: [facility];  Service: Endoscopy;  Laterality: N/A;   ESOPHAGOGASTRODUODENOSCOPY (EGD) WITH PROPOFOL  N/A 06/13/2021   Procedure: ESOPHAGOGASTRODUODENOSCOPY  (EGD) WITH PROPOFOL ;  Surgeon: Jinny Carmine, MD;  Location: [facility];  Service: Endoscopy;  Laterality: N/A;   ESOPHAGOGASTRODUODENOSCOPY (EGD) WITH PROPOFOL  N/A 02/28/2023   Procedure: ESOPHAGOGASTRODUODENOSCOPY (EGD) WITH PROPOFOL ;  Surgeon: Jinny Carmine, MD;  Location: [facility];  Service: Endoscopy;  Laterality: N/A;   EYE SURGERY     GASTRIC BYPASS  2000, 2005   Dr. Mia REDBIRD IMPLANT PLACEMENT  10/2011   Ike REDBIRD IMPLANT PLACEMENT N/A 09/18/2021   Procedure: REDBIRD IMPLANT FIRST STAGE;  Surgeon: Denise Hamilton, MD;  Location: [facility];  Service: Urology;  Laterality: N/A;   INTERSTIM IMPLANT PLACEMENT N/A 09/18/2021   Procedure: REDBIRD IMPLANT SECOND STAGE WITH IMPEDENCE CHECK;  Surgeon: Denise Hamilton, MD;  Location: [facility];  Service: Urology;  Laterality: N/A;   INTERSTIM IMPLANT REMOVAL N/A 09/18/2021   Procedure: REMOVAL OF INTERSTIM IMPLANT;  Surgeon: Denise Hamilton, MD;  Location: [facility];  Service: Urology;  Laterality: N/A;   LEFT HEART CATH AND CORONARY ANGIOGRAPHY N/A 04/1986   LEFT HEART CATH AND CORONARY ANGIOGRAPHY Left 07/24/2012   LEFT HEART CATH AND CORS/GRAFTS ANGIOGRAPHY N/A 12/31/2019   Procedure: LEFT HEART CATH AND CORS/GRAFTS ANGIOGRAPHY;  Surgeon: Perla Evalene PARAS, MD;  Location: [facility];  Service: Cardiovascular;  Laterality: N/A;   PANNICULECTOMY N/A 06/16/2019   Procedure: PANNICULECTOMY;  Surgeon: Elisabeth Craig RAMAN, MD;  Location: [facility];  Service: Plastics;  Laterality: N/A;  3 hours, please   REVERSE SHOULDER ARTHROPLASTY Right 08/13/2017   Procedure: REVERSE RIGHT SHOULDER ARTHROPLASTY;  Surgeon: Josefina Chew, MD;  Location: [facility];  Service: Orthopedics;  Laterality: Right;   RIGHT/LEFT HEART CATH AND CORONARY ANGIOGRAPHY N/A 09/06/2020   Procedure: RIGHT/LEFT HEART CATH AND CORONARY ANGIOGRAPHY;  Surgeon: Mady Bruckner, MD;  Location: [facility];  Service: Cardiovascular;  Laterality:  N/A;   RIGHT/LEFT HEART CATH AND CORONARY ANGIOGRAPHY N/A 06/04/2022   Procedure: RIGHT/LEFT HEART CATH AND CORONARY ANGIOGRAPHY;  Surgeon: Mady Bruckner, MD;  Location: [facility];  Service: Cardiovascular;  Laterality: N/A;   ROTATOR CUFF REPAIR Right    SPINE SURGERY  1995   [place]   TOOTH EXTRACTION  11/13/2021   TOTAL ABDOMINAL HYSTERECTOMY W/ BILATERAL SALPINGOOPHORECTOMY  1974   TOTAL KNEE ARTHROPLASTY Bilateral 2007   Procedure: TOTAL KNEE ARTHROPLASTY; Surgeons: Kathi, MD and [name], MD   UMBILICAL HERNIA REPAIR  08/11/2015    Current Medications: Active Medications[1]  Allergies:   Amoxicillin , Clarithromycin , Demeclocycline, Erythromycin, Flagyl [metronidazole], Glucophage [metformin hcl], Tetracyclines & related, Potassium chloride , Tetracycline, Diovan [valsartan], Sulfa  antibiotics, and Xanax [alprazolam]   Social History   Socioeconomic History   Marital status: Married    Spouse name: Koren    Number of children: 2  Years of education: College    Highest education level: Not on file  Occupational History    Employer: retired  Tobacco Use   Smoking status: Never    Passive exposure: Never   Smokeless tobacco: Never  Vaping Use   Vaping status: Never Used  Substance and Sexual Activity   Alcohol  use: No   Drug use: Not Currently    Comment: prescribed valium    Sexual activity: Not Currently    Birth control/protection: Post-menopausal, Surgical  Other Topics Concern   Not on file  Social History Narrative   Patient lives at home with husband Koren.    Right Handed   Drinks caffeinated tea occasionally   One pet in house, dog   Social Drivers of Health   Tobacco Use: Low Risk (06/29/2024)   Patient History    Smoking Tobacco Use: Never    Smokeless Tobacco Use: Never    Passive Exposure: Never  Financial Resource Strain: Low Risk (06/25/2024)   Overall Financial Resource Strain (CARDIA)    Difficulty of Paying Living Expenses: Not hard  at all  Food Insecurity: No Food Insecurity (06/25/2024)   Epic    Worried About Programme Researcher, Broadcasting/film/video in the Last Year: Never true    Ran Out of Food in the Last Year: Never true  Transportation Needs: Unmet Transportation Needs (06/25/2024)   Epic    Lack of Transportation (Medical): Yes    Lack of Transportation (Non-Medical): No  Physical Activity: Inactive (06/25/2024)   Exercise Vital Sign    Days of Exercise per Week: 0 days    Minutes of Exercise per Session: 0 min  Stress: Stress Concern Present (06/25/2024)   [employer] of Occupational Health - Occupational Stress Questionnaire    Feeling of Stress: Rather much  Social Connections: Moderately Isolated (06/25/2024)   Social Connection and Isolation Panel    Frequency of Communication with Friends and Family: More than three times a week    Frequency of Social Gatherings with Friends and Family: More than three times a week    Attends Religious Services: Never    Database Administrator or Organizations: No    Attends Banker Meetings: Never    Marital Status: Married  Depression (PHQ2-9): High Risk (06/29/2024)   Depression (PHQ2-9)    PHQ-2 Score: 14  Alcohol  Screen: Low Risk (06/25/2024)   Alcohol  Screen    Last Alcohol  Screening Score (AUDIT): 0  Housing: Low Risk (06/25/2024)   Epic    Unable to Pay for Housing in the Last Year: No    Number of Times Moved in the Last Year: 0    Homeless in the Last Year: No  Utilities: Not At Risk (06/25/2024)   Epic    Threatened with loss of utilities: No  Health Literacy: Adequate Health Literacy (06/25/2024)   B1300 Health Literacy    Frequency of need for help with medical instructions: Never     Family History:  The patient's family history includes Depression in her mother; Diabetes in her daughter; Headache in her mother; Heart disease in her daughter, father, and mother; Hypertension in her daughter, father, mother, and son; Osteoporosis in her  father; Prostate cancer in her father; Stroke in her father and mother; Thyroid  disease in her mother.  ROS:   12-point review of systems is negative unless otherwise noted in the HPI.   EKGs/Labs/Other Studies Reviewed:    Studies reviewed were summarized above. The additional studies were reviewed today: ***  EKG:  EKG is ordered today.  The EKG ordered today demonstrates ***  Recent Labs: 11/06/2023: B Natriuretic Peptide 62.6 05/19/2024: ALT 21; BUN 25; Creatinine 1.44; Hemoglobin 12.1; Platelet Count 304; Potassium 3.4; Sodium 138  Recent Lipid Panel    Component Value Date/Time   CHOL 90 10/09/2023 1204   CHOL 155 02/27/2014 0420   TRIG 100 10/09/2023 1204   TRIG 121 02/27/2014 0420   HDL 43 (L) 10/09/2023 1204   HDL 43 02/27/2014 0420   CHOLHDL 2.1 10/09/2023 1204   VLDL 20 10/23/2022 1324   VLDL 24 02/27/2014 0420   LDLCALC 28 10/09/2023 1204   LDLCALC 88 02/27/2014 0420   LDLDIRECT 126 (H) 07/13/2022 1529    PHYSICAL EXAM:    VS:  There were no vitals taken for this visit.  BMI: There is no height or weight on file to calculate BMI.  Physical Exam  Wt Readings from Last 3 Encounters:  06/29/24 212 lb (96.2 kg)  06/29/24 212 lb (96.2 kg)  05/27/24 219 lb 6.4 oz (99.5 kg)     ASSESSMENT & PLAN:   ***   {Are you ordering a CV Procedure (e.g. stress test, cath, DCCV, TEE, etc)?   Press F2        :[id number]}     Disposition: F/u with Dr. PIERRETTE or an APP in ***.   Medication Adjustments/Labs and Tests Ordered: Current medicines are reviewed at length with the patient today.  Concerns regarding medicines are outlined above. Medication changes, Labs and Tests ordered today are summarized above and listed in the Patient Instructions accessible in Encounters.   SignedBernardino Bring, PA-C 07/02/2024 7:44 AM     [facility] [place] [address] [place], [place] [address] [phone]    [1]  No outpatient medications have been  marked as taking for the 07/03/24 encounter (Appointment) with Bring Bernardino HERO, PA-C.

## 2024-07-03 ENCOUNTER — Encounter: Payer: Self-pay | Admitting: *Deleted

## 2024-07-03 ENCOUNTER — Other Ambulatory Visit (INDEPENDENT_AMBULATORY_CARE_PROVIDER_SITE_OTHER): Payer: Self-pay | Admitting: Nurse Practitioner

## 2024-07-03 ENCOUNTER — Ambulatory Visit (INDEPENDENT_AMBULATORY_CARE_PROVIDER_SITE_OTHER): Admitting: Nurse Practitioner

## 2024-07-03 ENCOUNTER — Ambulatory Visit: Admitting: Physician Assistant

## 2024-07-03 VITALS — BP 115/70 | HR 81 | Resp 17 | Ht 64.0 in

## 2024-07-03 DIAGNOSIS — T8189XA Other complications of procedures, not elsewhere classified, initial encounter: Secondary | ICD-10-CM

## 2024-07-03 DIAGNOSIS — T8789 Other complications of amputation stump: Secondary | ICD-10-CM

## 2024-07-03 DIAGNOSIS — T8740 Infection of amputation stump, unspecified extremity: Secondary | ICD-10-CM

## 2024-07-03 NOTE — Telephone Encounter (Signed)
 Verbal order given for OT.

## 2024-07-03 NOTE — Progress Notes (Unsigned)
"   Per Jamison Daring, wound on stump cleansed with normal saline covered with xeroform and gauze secured with medi pore tape. Culture also taken to be sent out on today.   Dois Seip CMA  "

## 2024-07-03 NOTE — Telephone Encounter (Signed)
 DME sent to [facility] through [place]. Form placed in Dr Marylynn folder for signature.

## 2024-07-06 ENCOUNTER — Encounter (INDEPENDENT_AMBULATORY_CARE_PROVIDER_SITE_OTHER): Payer: Self-pay | Admitting: Nurse Practitioner

## 2024-07-06 NOTE — Progress Notes (Signed)
 "  Subjective:    Patient ID: Denise Sharp, female    DOB: January 19, 1946, 78 y.o.   MRN: [id number] Chief Complaint  Patient presents with   Follow-up    stump not healing    HPI  Discussed the use of AI scribe software for clinical note transcription with the patient, who gave verbal consent to proceed.  History of Present Illness Denise Sharp is a 78 year old female with right above-knee amputation who presents for evaluation of a non-healing, intermittently draining amputation stump with recurrent infection.  She underwent right above-knee amputation prior to September 2025, with staple removal in mid-September. She received a two-week follow-up after staple removal , but this was not brought to her attention by her facility, and has not been evaluated by vascular surgery since that time.  Since discharge to a nursing facility, the stump has required ongoing dressing changes. She reports that dressings were often not applied securely and sometimes fell off, with delays of one to two days before reapplication. After discharge from the nursing facility due to loss of Medicare coverage, she has received home health nursing visits once or twice weekly for wound care.  The stump has developed infection twice since staple removal, with the most recent episode treated with Keflex  initiated on Monday of the current week. She has three to four days of antibiotics remaining and reports significant malaise while taking them. She has used Neosporin and Vaseline gauze on the wound. The wound course has been fluctuating, with intermittent closure followed by recurrent drainage and purulence; this is the third episode of drainage since returning home. Last week, the wound was draining yellow-green fluid over a larger area. She denies acute pain but describes the area as tender, particularly after using a sling with her walker during physical therapy. She has not had sensation in either leg for several  years.  She has not received a stump shrinker and has not had any interventions since staple removal other than dressing changes. She improvised a dressing using a hospital sock while at the nursing facility. She was told she would receive a shrinker in December, but this has not occurred. She was unaware of which provider was responsible for her stump care until recently. Her husband is her only caregiver at home and he has terminal cancer, which limits her support at home.    Results Wound culture of amputation stump Purulent drainage collected from amputation stump using swab for microbiological culture.  Wound dressing application to amputation stump Aquacel dressing applied to amputation stump with firm wrap to provide compression and promote wound healing.  Shrinker application to amputation stump Shrinker garment placed over amputation stump to provide compression and assist with residual limb shaping.   Review of Systems  Skin:  Positive for wound.  All other systems reviewed and are negative.      Objective:   Physical Exam Vitals reviewed.  HENT:     Head: Normocephalic.  Cardiovascular:     Rate and Rhythm: Normal rate.  Pulmonary:     Effort: Pulmonary effort is normal.  Skin:    General: Skin is warm and dry.  Neurological:     Mental Status: She is alert and oriented to person, place, and time.  Psychiatric:        Mood and Affect: Mood normal.        Behavior: Behavior normal.        Thought Content: Thought content normal.  Judgment: Judgment normal.     Physical Exam EXTREMITIES: Drainage present on extremity with green to yellow discharge, no acute infection signs. Small wound with protrusion in the area. Discoloration present, not indicative of gangrenous change.  BP 115/70   Pulse 81   Resp 17   Ht 5' 4 (1.626 m)   BMI 36.39 kg/m   Past Medical History:  Diagnosis Date   (HFpEF) heart failure with preserved ejection fraction (HCC)     a. 12/2019 Echo: EF 65-70%, Gr2 DD. No significant valvular dzs.   Abnormality of gait 03/25/2013   Adrenal mass, left    Anginal pain    Anxiety    Aortic atherosclerosis    Arthritis    Asthma    CAD (coronary artery disease) with h/o Atypical Chest Pain    a. 04/1986 Cath ([facility]): nl cors, EF 65%; b. 07/2012 Cath [facility]): Diff minor irregs; c. 07/2016 MV [facility]): Equivocal; d. 08/2016 Cardiac CT Ca2+ score [facility]): Ca2+ 1548; e. 05/2019 MV: No ischemia. EF 75%; f. 12/2019 PCI: LM nl, LAD 65ost (3.5x12 Resolute DES), 29m (2.75x12 Resolute DES),LCX/RCA nl; g. 08/2020 Cath: patent LAD stents, RCA 40ost, elev RH pressures, EF >65%.   Cervical spinal stenosis 1994   due to trauma to back ([employer] accident), has intermittent paralysis and parasthesias   Cervicogenic headache 03/23/2014   CKD (chronic kidney disease), stage III (HCC)    Depression    Diverticulosis    Dizziness    a.) chronic   DJD (degenerative joint disease)    a. Chronic R shoulder pain   Dyspnea    Esophageal stenosis 09/2009   a.) transient outlet obstruction by food, cleared by EGD   Family history of adverse reaction to anesthesia    a.) daughter with (+) PONV   Gastric bypass status for obesity    GERD (gastroesophageal reflux disease)    Gout    Headache(784.0)    HLD (hyperlipidemia)    Hypertension    IBS (irritable bowel syndrome)    IDA (iron  deficiency anemia)    a.) post 2 unit txfsn 2009, normal endo/colonoscopy by [facility]   Left bundle branch block (LBBB)    a.) Intermittently present - likely rate related.   NSVT (nonsustained ventricular tachycardia) (HCC) 11/15/2020   a.) Holter study 11/15/2020; 11 episodes of NSVT lasting up to 8 beats with a maximum rate of 210 bpm; 14 atrial runs lasting up to 18 beats with a rate of up to 250 bpm.  Some atrial runs felt to be NSVT.   NSVT (nonsustained ventricular tachycardia) (HCC)    a. 10/2020 [facility]: 11 runs of NSVT up to 8 beats.   Obesity    OSA on CPAP     Polyneuropathy in diabetes(357.2) 03/25/2013   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/09/2021   Right V1 distribution   PSVT (paroxysmal supraventricular tachycardia)    a. 10/2020 [facility]: 14 atrial runs up to 18 beats, max HR 250.   Restless leg syndrome    Rotator cuff arthropathy, right 08/13/2017   Syncope and collapse 03/12/2014   Type II diabetes mellitus (HCC)     Social History   Socioeconomic History   Marital status: Married    Spouse name: Koren    Number of children: 2   Years of education: College    Highest education level: Not on file  Occupational History    Employer: retired  Tobacco Use   Smoking status: Never    Passive exposure: Never   Smokeless  tobacco: Never  Vaping Use   Vaping status: Never Used  Substance and Sexual Activity   Alcohol  use: No   Drug use: Not Currently    Comment: prescribed valium    Sexual activity: Not Currently    Birth control/protection: Post-menopausal, Surgical  Other Topics Concern   Not on file  Social History Narrative   Patient lives at home with husband Koren.    Right Handed   Drinks caffeinated tea occasionally   One pet in house, dog   Social Drivers of Health   Tobacco Use: Low Risk (07/06/2024)   Patient History    Smoking Tobacco Use: Never    Smokeless Tobacco Use: Never    Passive Exposure: Never  Financial Resource Strain: Low Risk (06/25/2024)   Overall Financial Resource Strain (CARDIA)    Difficulty of Paying Living Expenses: Not hard at all  Food Insecurity: No Food Insecurity (06/25/2024)   Epic    Worried About Programme Researcher, Broadcasting/film/video in the Last Year: Never true    Ran Out of Food in the Last Year: Never true  Transportation Needs: Unmet Transportation Needs (06/25/2024)   Epic    Lack of Transportation (Medical): Yes    Lack of Transportation (Non-Medical): No  Physical Activity: Inactive (06/25/2024)   Exercise Vital Sign    Days of Exercise per Week: 0 days    Minutes of Exercise per Session: 0 min   Stress: Stress Concern Present (06/25/2024)   [employer] of Occupational Health - Occupational Stress Questionnaire    Feeling of Stress: Rather much  Social Connections: Moderately Isolated (06/25/2024)   Social Connection and Isolation Panel    Frequency of Communication with Friends and Family: More than three times a week    Frequency of Social Gatherings with Friends and Family: More than three times a week    Attends Religious Services: Never    Database Administrator or Organizations: No    Attends Banker Meetings: Never    Marital Status: Married  Catering Manager Violence: Not At Risk (06/25/2024)   Epic    Fear of Current or Ex-Partner: No    Emotionally Abused: No    Physically Abused: No    Sexually Abused: No  Depression (PHQ2-9): High Risk (06/29/2024)   Depression (PHQ2-9)    PHQ-2 Score: 14  Alcohol  Screen: Low Risk (06/25/2024)   Alcohol  Screen    Last Alcohol  Screening Score (AUDIT): 0  Housing: Low Risk (06/25/2024)   Epic    Unable to Pay for Housing in the Last Year: No    Number of Times Moved in the Last Year: 0    Homeless in the Last Year: No  Utilities: Not At Risk (06/25/2024)   Epic    Threatened with loss of utilities: No  Health Literacy: Adequate Health Literacy (06/25/2024)   B1300 Health Literacy    Frequency of need for help with medical instructions: Never    Past Surgical History:  Procedure Laterality Date   AMPUTATION Left 03/07/2024   Procedure: AMPUTATION BELOW KNEE;  Surgeon: Tisa Curry LABOR, MD;  Location: [facility];  Service: Vascular;  Laterality: Left;   APPENDECTOMY     BACK SURGERY     CARDIOVASCULAR STRESS TEST     CARDIOVERSION N/A 10/30/2022   Procedure: CARDIOVERSION;  Surgeon: Mady Bruckner, MD;  Location: [facility];  Service: Cardiovascular;  Laterality: N/A;   CHOLECYSTECTOMY N/A 1976   COLONOSCOPY     CORONARY ANGIOPLASTY  CORONARY STENT INTERVENTION N/A 01/04/2020   Procedure: CORONARY  STENT INTERVENTION;  Surgeon: Darron Deatrice LABOR, MD;  Location: [facility];  Service: Cardiovascular;  Laterality: N/A;  LAD    DIAPHRAGMATIC HERNIA REPAIR  2015   ESOPHAGEAL DILATION     multiple   ESOPHAGOGASTRODUODENOSCOPY (EGD) WITH PROPOFOL  N/A 05/11/2021   Procedure: ESOPHAGOGASTRODUODENOSCOPY (EGD) WITH PROPOFOL ;  Surgeon: Jinny Carmine, MD;  Location: [facility];  Service: Endoscopy;  Laterality: N/A;   ESOPHAGOGASTRODUODENOSCOPY (EGD) WITH PROPOFOL  N/A 06/13/2021   Procedure: ESOPHAGOGASTRODUODENOSCOPY (EGD) WITH PROPOFOL ;  Surgeon: Jinny Carmine, MD;  Location: [facility];  Service: Endoscopy;  Laterality: N/A;   ESOPHAGOGASTRODUODENOSCOPY (EGD) WITH PROPOFOL  N/A 02/28/2023   Procedure: ESOPHAGOGASTRODUODENOSCOPY (EGD) WITH PROPOFOL ;  Surgeon: Jinny Carmine, MD;  Location: [facility];  Service: Endoscopy;  Laterality: N/A;   EYE SURGERY     GASTRIC BYPASS  2000, 2005   Dr. Mia REDBIRD IMPLANT PLACEMENT  10/2011   Ike REDBIRD IMPLANT PLACEMENT N/A 09/18/2021   Procedure: REDBIRD IMPLANT FIRST STAGE;  Surgeon: Gaston Hamilton, MD;  Location: [facility];  Service: Urology;  Laterality: N/A;   INTERSTIM IMPLANT PLACEMENT N/A 09/18/2021   Procedure: REDBIRD IMPLANT SECOND STAGE WITH IMPEDENCE CHECK;  Surgeon: Gaston Hamilton, MD;  Location: [facility];  Service: Urology;  Laterality: N/A;   INTERSTIM IMPLANT REMOVAL N/A 09/18/2021   Procedure: REMOVAL OF INTERSTIM IMPLANT;  Surgeon: Gaston Hamilton, MD;  Location: [facility];  Service: Urology;  Laterality: N/A;   LEFT HEART CATH AND CORONARY ANGIOGRAPHY N/A 04/1986   LEFT HEART CATH AND CORONARY ANGIOGRAPHY Left 07/24/2012   LEFT HEART CATH AND CORS/GRAFTS ANGIOGRAPHY N/A 12/31/2019   Procedure: LEFT HEART CATH AND CORS/GRAFTS ANGIOGRAPHY;  Surgeon: Perla Evalene PARAS, MD;  Location: [facility];  Service: Cardiovascular;  Laterality: N/A;   PANNICULECTOMY N/A 06/16/2019   Procedure:  PANNICULECTOMY;  Surgeon: Elisabeth Craig RAMAN, MD;  Location: [facility];  Service: Plastics;  Laterality: N/A;  3 hours, please   REVERSE SHOULDER ARTHROPLASTY Right 08/13/2017   Procedure: REVERSE RIGHT SHOULDER ARTHROPLASTY;  Surgeon: Josefina Chew, MD;  Location: [facility];  Service: Orthopedics;  Laterality: Right;   RIGHT/LEFT HEART CATH AND CORONARY ANGIOGRAPHY N/A 09/06/2020   Procedure: RIGHT/LEFT HEART CATH AND CORONARY ANGIOGRAPHY;  Surgeon: Mady Bruckner, MD;  Location: [facility];  Service: Cardiovascular;  Laterality: N/A;   RIGHT/LEFT HEART CATH AND CORONARY ANGIOGRAPHY N/A 06/04/2022   Procedure: RIGHT/LEFT HEART CATH AND CORONARY ANGIOGRAPHY;  Surgeon: Mady Bruckner, MD;  Location: [facility];  Service: Cardiovascular;  Laterality: N/A;   ROTATOR CUFF REPAIR Right    SPINE SURGERY  1995   [place]   TOOTH EXTRACTION  11/13/2021   TOTAL ABDOMINAL HYSTERECTOMY W/ BILATERAL SALPINGOOPHORECTOMY  1974   TOTAL KNEE ARTHROPLASTY Bilateral 2007   Procedure: TOTAL KNEE ARTHROPLASTY; Surgeons: Kathi, MD and [name], MD   UMBILICAL HERNIA REPAIR  08/11/2015    Family History  Problem Relation Age of Onset   Heart disease Father    Hypertension Father    Prostate cancer Father    Stroke Father    Osteoporosis Father    Stroke Mother    Depression Mother    Headache Mother    Heart disease Mother    Thyroid  disease Mother    Hypertension Mother    Diabetes Daughter    Heart disease Daughter    Hypertension Daughter    Hypertension Son     Allergies[1]  Latest Ref Rng & Units 05/19/2024    2:16 PM 03/15/2024    8:12 AM 03/13/2024    6:36 AM  CBC  WBC 4.0 - 10.5 K/uL 12.5  11.5  13.2   Hemoglobin 12.0 - 15.0 g/dL 87.8  9.4  8.6   Hematocrit 36.0 - 46.0 % 38.9  29.6  27.7   Platelets 150 - 400 K/uL 304  455  456       CMP     Component Value Date/Time   NA 138 05/19/2024 1416   NA 137 10/21/2023 1417   NA 141 06/21/2014 1349   K 3.4 (L) 05/19/2024  1416   K 4.0 06/21/2014 1349   CL 104 05/19/2024 1416   CL 106 06/21/2014 1349   CO2 23 05/19/2024 1416   CO2 24 06/21/2014 1349   GLUCOSE 111 (H) 05/19/2024 1416   GLUCOSE 174 (H) 06/21/2014 1349   BUN 25 (H) 05/19/2024 1416   BUN 30 (H) 10/21/2023 1417   BUN 12 06/21/2014 1349   CREATININE 1.44 (H) 05/19/2024 1416   CREATININE 1.30 (H) 01/24/2024 1009   CALCIUM  8.1 (L) 05/19/2024 1416   CALCIUM  8.7 06/21/2014 1349   PROT 6.1 (L) 05/19/2024 1416   PROT 7.3 10/30/2012 1436   ALBUMIN  2.6 (L) 05/19/2024 1416   ALBUMIN  3.0 (L) 10/30/2012 1436   AST 23 05/19/2024 1416   ALT 21 05/19/2024 1416   ALT 19 10/30/2012 1436   ALKPHOS 166 (H) 05/19/2024 1416   ALKPHOS 125 10/30/2012 1436   BILITOT 0.5 05/19/2024 1416   GFR 53.35 (L) 03/03/2024 1111   EGFR 42 (L) 01/24/2024 1009   EGFR 38 (L) 10/21/2023 1417   GFRNONAA 37 (L) 05/19/2024 1416   GFRNONAA 57 (L) 06/21/2014 1349   GFRNONAA 55 (L) 02/25/2014 2153   GFRNONAA 70 12/13/2011 0919     No results found.     Assessment & Plan:   1. Delayed surgical wound healing of below-the-knee amputation stump (HCC) (Primary) Postoperative wound complication of lower limb amputation Chronic wound complication with delayed healing and possible abscess. Inadequate compression and lack of shrinker use contribute to issues. Risks include need for surgical drainage. - Ordered CT scan of stump to evaluate for abscess or fluid collection, scheduled midday.  - Provided shrinker for stump shaping and pocket reduction. - Initiated referral to prosthetic/shrinker company for ongoing management. - Permitted continued weight bearing on stump, with reassessment after CT results. - Arranged follow-up visit after CT scan to reassess wound and determine further management. - CT EXTREMITY LOWER LEFT W CONTRAST; Future   2. Amputation stump infection (HCC) Infection of amputation stump Recurrent subacute infection with intermittent drainage and  incomplete healing. Suspected underlying abscess or fluid collection. Awaiting wound culture for antibiotic guidance. - Obtained wound culture to guide antibiotic therapy. - Instructed her to complete cephalexin  course and await culture results before further antibiotics. - Advised use of Vaseline or Vaseline gauze instead of topical antibiotics; provided Vaseline gauze. - Instructed to keep gauze in place until changed by home health nursing, with additional supplies as needed. - Arranged continued wound care with home health nursing.  Assessment and Plan Assessment & Plan        Medications Ordered Prior to Encounter[2]  There are no Patient Instructions on file for this visit. No follow-ups on file.   Raymonda Pell E Caprice Mccaffrey, NP       [1]  Allergies Allergen Reactions   Amoxicillin  Diarrhea    amoxicillin   Clarithromycin  Nausea And Vomiting and Other (See Comments)    Severe irritable bowel  clarithromycin    Demeclocycline Hives   Erythromycin Nausea And Vomiting and Other (See Comments)    Severe irritable bowel   Flagyl [Metronidazole] Nausea And Vomiting and Other (See Comments)    Severe irritable bowel   Glucophage [Metformin Hcl] Nausea And Vomiting and Other (See Comments)    Sick I won't take anything that has metformin in it   Tetracyclines & Related Hives and Rash   Potassium Chloride  Other (See Comments)    Cannot swallow potssium pills, able to tolerate liquid potassium   Tetracycline Other (See Comments)   Diovan [Valsartan] Nausea Only        Sulfa  Antibiotics Other (See Comments) and Rash    As child   Xanax [Alprazolam] Other (See Comments)    Hyperactivity   [2]  Current Outpatient Medications on File Prior to Visit  Medication Sig Dispense Refill   acetaminophen  (TYLENOL ) 325 MG tablet Take 2 tablets (650 mg total) by mouth every 6 (six) hours as needed for mild pain (pain score 1-3).     allopurinol  (ZYLOPRIM ) 100 MG tablet Take 100 mg by  mouth daily.     apixaban  (ELIQUIS ) 5 MG TABS tablet Take 5 mg by mouth 2 (two) times daily.     atorvastatin  (LIPITOR ) 80 MG tablet Take 80 mg by mouth daily.     cephALEXin  (KEFLEX ) 500 MG capsule Take 1 capsule (500 mg total) by mouth 2 (two) times daily. 14 capsule 0   cetirizine (ZYRTEC) 10 MG tablet Take 10 mg by mouth daily.     Cholecalciferol  (VITAMIN D3) 1.25 MG (50000 UT) CAPS Take 5,000 Units by mouth daily.     clotrimazole -betamethasone  (LOTRISONE ) cream Apply 1 application topically 2 (two) times daily as needed (irritation).     colchicine  0.6 MG tablet Take 0.6 mg by mouth daily.     conjugated estrogens  (PREMARIN ) vaginal cream Apply one pea-sized amount around the opening of the urethra daily for 2 weeks, then 3 times weekly moving forward. 30 g 4   dicyclomine  (BENTYL ) 20 MG tablet Take 1 tablet (20 mg total) by mouth 3 (three) times daily before meals. 180 tablet 2   DULoxetine  (CYMBALTA ) 60 MG capsule Take 60 mg by mouth daily.     ezetimibe  (ZETIA ) 10 MG tablet Take 10 mg by mouth daily. (Patient taking differently: Take 10 mg by mouth daily. Unsure if taking)     Finerenone  (KERENDIA ) 10 MG TABS Take 1 tablet (10 mg total) by mouth in the morning. 90 tablet 3   fluticasone  (FLONASE ) 50 MCG/ACT nasal spray Place 2 sprays into both nostrils daily. 16 g 6   glucose blood (ONETOUCH VERIO) test strip Use to check blood sugar twice daily. 100 each 1   insulin  aspart (NOVOLOG  FLEXPEN) 100 UNIT/ML FlexPen Inject 8-14 Units into the skin 3 (three) times daily with meals. 90-150= 8 units; 151-200=9 units; 201-250=10 units; 251-300= 11 units; 301-350= 12 units; 351-400=13 units; 401 or higher =14 units 36 mL 3   insulin  degludec (TRESIBA ) 100 UNIT/ML FlexTouch Pen Inject 15 Units into the skin at bedtime. 15 mL 1   Insulin  Pen Needle (DROPLET PEN NEEDLES) 31G X 5 MM MISC USE ONE NEEDLE SUBCUTANEOUSLY AS DIRECTED (REMOVE AND DISCARD NEEDLE IN SHARPS CONTAINER IMMEDIATELY AFTER USE) 100  each 1   levothyroxine  (SYNTHROID ) 25 MCG tablet Take 25 mcg by mouth daily before breakfast.     lidocaine  (  LIDODERM ) 5 % Place 1 patch onto the skin daily. Remove & Discard patch within 12 hours or as directed by MD     loperamide  (IMODIUM ) 2 MG capsule Take 4-6 mg by mouth 3 (three) times daily before meals. (Patient taking differently: Take 4-6 mg by mouth 3 (three) times daily before meals. As needed)     midodrine (PROAMATINE) 5 MG tablet Take 5 mg by mouth as needed.     nitroGLYCERIN  (NITROSTAT ) 0.4 MG SL tablet Place 0.4 mg under the tongue every 5 (five) minutes as needed for chest pain.     nystatin  (MYCOSTATIN /NYSTOP ) powder Apply 1 g topically 4 (four) times daily as needed (rash).     ondansetron  (ZOFRAN ) 4 MG tablet Take 4 mg by mouth every 8 (eight) hours as needed for nausea or vomiting.     OneTouch Delica Lancets 33G MISC Used to check blood sugar two times a day. 100 each 4   oxycodone  (OXY-IR) 5 MG capsule Take 5 mg by mouth every 4 (four) hours as needed. 2.5 mg as needed every 6 hours     pantoprazole  (PROTONIX ) 40 MG tablet Take 40 mg by mouth daily.     phenylephrine -shark liver oil-mineral oil-petrolatum (PREPARATION H) 0.25-14-74.9 % rectal ointment Place 1 Application rectally 2 (two) times daily as needed for hemorrhoids (prn every 8 hours).     Polyethyl Glycol-Propyl Glycol (SYSTANE OP) Place 1 drop into both eyes 4 (four) times daily as needed (dry eyes).     pregabalin  (LYRICA ) 50 MG capsule Take 1 capsule (50 mg total) by mouth 2 (two) times daily. 180 capsule 1   sitaGLIPtin  (JANUVIA ) 50 MG tablet Take 1 tablet (50 mg total) by mouth daily. 90 tablet 1   sitaGLIPtin  (JANUVIA ) 50 MG tablet Take 50 mg by mouth daily.     trolamine salicylate (ASPERCREME) 10 % cream Apply 1 Application topically daily.     Continuous Glucose Sensor (FREESTYLE LIBRE 3 PLUS SENSOR) MISC Change sensor every 15 days. 6 each 3   fluconazole  (DIFLUCAN ) 100 MG tablet Take 1 tablet (100 mg  total) by mouth daily. X 7 days (Patient not taking: Reported on 06/29/2024) 7 tablet 0   torsemide  (DEMADEX ) 20 MG tablet Take 20 mg by mouth daily. (Patient not taking: Reported on 06/29/2024)     No current facility-administered medications on file prior to visit.   "

## 2024-07-07 ENCOUNTER — Other Ambulatory Visit

## 2024-07-07 DIAGNOSIS — I130 Hypertensive heart and chronic kidney disease with heart failure and stage 1 through stage 4 chronic kidney disease, or unspecified chronic kidney disease: Secondary | ICD-10-CM | POA: Diagnosis not present

## 2024-07-07 DIAGNOSIS — I447 Left bundle-branch block, unspecified: Secondary | ICD-10-CM | POA: Diagnosis not present

## 2024-07-07 DIAGNOSIS — I4891 Unspecified atrial fibrillation: Secondary | ICD-10-CM | POA: Diagnosis not present

## 2024-07-07 DIAGNOSIS — E279 Disorder of adrenal gland, unspecified: Secondary | ICD-10-CM | POA: Diagnosis not present

## 2024-07-07 DIAGNOSIS — I700 Atherosclerosis of aorta: Secondary | ICD-10-CM | POA: Diagnosis not present

## 2024-07-07 DIAGNOSIS — I5030 Unspecified diastolic (congestive) heart failure: Secondary | ICD-10-CM | POA: Diagnosis not present

## 2024-07-07 DIAGNOSIS — J45909 Unspecified asthma, uncomplicated: Secondary | ICD-10-CM | POA: Diagnosis not present

## 2024-07-07 DIAGNOSIS — E1142 Type 2 diabetes mellitus with diabetic polyneuropathy: Secondary | ICD-10-CM | POA: Diagnosis not present

## 2024-07-07 DIAGNOSIS — N189 Chronic kidney disease, unspecified: Secondary | ICD-10-CM | POA: Diagnosis not present

## 2024-07-07 DIAGNOSIS — E1122 Type 2 diabetes mellitus with diabetic chronic kidney disease: Secondary | ICD-10-CM | POA: Diagnosis not present

## 2024-07-07 DIAGNOSIS — M4802 Spinal stenosis, cervical region: Secondary | ICD-10-CM | POA: Diagnosis not present

## 2024-07-07 DIAGNOSIS — Z4781 Encounter for orthopedic aftercare following surgical amputation: Secondary | ICD-10-CM | POA: Diagnosis not present

## 2024-07-07 LAB — AEROBIC CULTURE

## 2024-07-07 NOTE — Patient Instructions (Signed)
 Visit Information  Thank you for taking time to visit with me today. Please don't hesitate to contact me if I can be of assistance to you before our next scheduled appointment.  Our next appointment is by telephone on 07/15/2024 at 10:30 am. Please call the care guide team at [phone] if you need to cancel or reschedule your appointment.   Following is a copy of your care plan:   Goals Addressed             This Visit's Progress    VBCI Social Work Care Plan LCSW   On track    Problems:   Lacks knowledge of how to connect to legal representation and home health services impacted by Financial Strain   CSW Clinical Goal(s):   Over the next 30 days, LCSW will provide community resource options for unmet needs related to Financial Strain impacting their ability to find in home aid coverage as evidenced by chart documentation.  Over the next 90 days, Patient will explore community resources for unmet needs related to Financial Strain impacting their ability to find in home aide coverage as evidenced by Patient report at follow up phone call.   Interventions:  Social Determinants of Health in Patient with Self Care Deficits: SDOH assessments completed: Financial Strain  Evaluation of current treatment plan related to unmet needs Discuss options available for in home aide services Discussed contacting legal services to execute a Trust Provided contact information for incontinence supply through [facility]  Encouraged Patient and spouse to follow up with the [employer] [place] regarding Aide and Attendance Program  Patient Goals/Self-Care Activities:  Call [facility]  to follow up on caregiver resources and support.  Call [facility] [place] and [facility] to follow up on community resources and support.   Plan:   Telephone follow up appointment with care management team member scheduled for:  July 15, 2024 at 10:30 am          Please call the Suicide and Crisis Lifeline: 988 call the [place]  National Suicide Prevention Lifeline: 360-861-0748 or TTY: (908)799-8344 TTY 573-676-2467) to talk to a trained counselor call 1-800-273-TALK (toll free, 24 hour hotline) call 911 if you are experiencing a Mental Health or Behavioral Health Crisis or need someone to talk to.  Patient verbalized understanding of Care plan and visit instructions communicated this visit  Murray Shawl, LCSW [facility], [facility] Licensed Clinical Social Worker Direct Dial : [phone]

## 2024-07-07 NOTE — Patient Outreach (Signed)
 Complex Care Management   Visit Note  07/07/2024  Name:  Denise Sharp MRN: [id number] DOB: 03/07/1946  Situation: Referral received for Complex Care Management related to self care deficits. I obtained verbal consent from Patient.  Visit completed with Patient  on the phone  Background:   Past Medical History:  Diagnosis Date   (HFpEF) heart failure with preserved ejection fraction (HCC)    a. 12/2019 Echo: EF 65-70%, Gr2 DD. No significant valvular dzs.   Abnormality of gait 03/25/2013   Adrenal mass, left    Anginal pain    Anxiety    Aortic atherosclerosis    Arthritis    Asthma    CAD (coronary artery disease) with h/o Atypical Chest Pain    a. 04/1986 Cath ([facility]): nl cors, EF 65%; b. 07/2012 Cath [facility]): Diff minor irregs; c. 07/2016 [facility]): Equivocal; d. 08/2016 Cardiac CT Ca2+ score [facility]): Ca2+ 1548; e. 05/2019 [facility]: No ischemia. EF 75%; f. 12/2019 PCI: LM nl, LAD 65ost (3.5x12 Resolute DES), 80m (2.75x12 Resolute DES),LCX/RCA nl; g. 08/2020 Cath: patent LAD stents, RCA 40ost, elev RH pressures, EF >65%.   Cervical spinal stenosis 1994   due to trauma to back ([employer] accident), has intermittent paralysis and parasthesias   Cervicogenic headache 03/23/2014   CKD (chronic kidney disease), stage III (HCC)    Depression    Diverticulosis    Dizziness    a.) chronic   DJD (degenerative joint disease)    a. Chronic R shoulder pain   Dyspnea    Esophageal stenosis 09/2009   a.) transient outlet obstruction by food, cleared by EGD   Family history of adverse reaction to anesthesia    a.) daughter with (+) PONV   Gastric bypass status for obesity    GERD (gastroesophageal reflux disease)    Gout    Headache(784.0)    HLD (hyperlipidemia)    Hypertension    IBS (irritable bowel syndrome)    IDA (iron  deficiency anemia)    a.) post 2 unit txfsn 2009, normal endo/colonoscopy by [facility]   Left bundle branch block (LBBB)    a.) Intermittently present - likely rate related.    NSVT (nonsustained ventricular tachycardia) (HCC) 11/15/2020   a.) Holter study 11/15/2020; 11 episodes of NSVT lasting up to 8 beats with a maximum rate of 210 bpm; 14 atrial runs lasting up to 18 beats with a rate of up to 250 bpm.  Some atrial runs felt to be NSVT.   NSVT (nonsustained ventricular tachycardia) (HCC)    a. 10/2020 [facility]: 11 runs of NSVT up to 8 beats.   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 10/2020 [facility]: 14 atrial runs up to 18 beats, max HR 250.   Restless leg syndrome    Rotator cuff arthropathy, right 08/13/2017   Syncope and collapse 03/12/2014   Type II diabetes mellitus (HCC)     Assessment: Patient Reported Symptoms:  Cognitive Cognitive Status: No symptoms reported, Alert and oriented to person, place, and time, Able to follow simple commands      Neurological Neurological Review of Symptoms: Headaches Oher Neurological Symptoms/Conditions [RPT]: headaches - room spining - AVV thinks maybe reaction to abx - only more to take Neurological Management Strategies: Medication therapy  HEENT HEENT Symptoms Reported: Other: (wear hearing aids- have appt with [facility] next week  to check hearing and hearing aids) HEENT Management Strategies: Medical device    Cardiovascular Cardiovascular Symptoms Reported: Not assessed    Respiratory Respiratory Symptoms Reported: Not assesed    Endocrine Endocrine Symptoms Reported: Not assessed    Gastrointestinal Gastrointestinal Symptoms Reported: Not assessed      Genitourinary Genitourinary Symptoms Reported: Not assessed    Integumentary Integumentary Symptoms Reported: Not assessed    Musculoskeletal Musculoskelatal Symptoms Reviewed: Not assessed        Psychosocial Psychosocial Symptoms Reported: Not assessed     Do you feel physically threatened by others?: No     07/07/2024    PHQ2-9 Depression Screening   Little interest or pleasure in doing things    Feeling down, depressed, or hopeless    PHQ-2 - Total Score    Trouble falling or staying asleep, or sleeping too much    Feeling tired or having little energy    Poor appetite or overeating     Feeling bad about yourself - or that you are a failure or have let yourself or your family down    Trouble concentrating on things, such as reading the newspaper or watching television    Moving or speaking so slowly that other people could have noticed.  Or the opposite - being so fidgety or restless that you have been moving around a lot more than usual    Thoughts that you would be better off dead, or hurting yourself in some way    PHQ2-9 Total Score    If you checked off any problems, how difficult have these problems made it for you to do your work, take care of things at home, or get along with other people    Depression Interventions/Treatment      There were no vitals filed for this visit.    Medications Reviewed Today     Reviewed by Angelena Finis HERO, LCSW (Social Worker) on 07/07/24 at 1600  Med List Status: <None>   Medication Order Taking? Sig Documenting Provider Last Dose Status Informant  acetaminophen  (TYLENOL ) 325 MG tablet [id number] Yes Take 2 tablets (650 mg total) by mouth every 6 (six) hours as needed for mild pain (pain score 1-3). Jens Durand, MD  Active   allopurinol  (ZYLOPRIM ) 100 MG tablet [id number] Yes Take 100 mg by mouth daily. [provider]  Active   apixaban  (ELIQUIS ) 5 MG TABS tablet [id number] Yes Take 5 mg by mouth 2 (two) times daily. [provider]  Active   atorvastatin  (LIPITOR ) 80 MG tablet [id number] Yes Take 80 mg by mouth daily. [provider]  Active   cephALEXin  (KEFLEX ) 500 MG capsule [id number] Yes Take 1 capsule (500 mg total) by mouth 2 (two) times daily. Narendra, Nischal, MD  Active   cetirizine (ZYRTEC) 10 MG tablet  [id number] Yes Take 10 mg by mouth daily. [provider]  Active   Cholecalciferol  (VITAMIN D3) 1.25 MG (50000 UT) CAPS [id number] Yes Take 5,000 Units by mouth daily. [provider]  Active   clotrimazole -betamethasone  (LOTRISONE ) cream [id number] Yes Apply 1 application topically 2 (two) times daily as needed (irritation). [provider]  Active Pharmacy Records  colchicine  0.6 MG tablet [id number] Yes Take 0.6 mg by mouth daily. [provider]  Active   conjugated estrogens  (PREMARIN ) vaginal cream [id number] Yes Apply one pea-sized amount around the opening of the urethra daily for 2 weeks, then 3 times weekly moving forward. Vaillancourt, Samantha, PA-C  Active Pharmacy Records  Continuous Glucose  Sensor (FREESTYLE LIBRE 3 PLUS SENSOR) MISC [id number] Yes Change sensor every 15 days. Narendra, Nischal, MD  Active   dicyclomine  (BENTYL ) 20 MG tablet [id number] Yes Take 1 tablet (20 mg total) by mouth 3 (three) times daily before meals. Marylynn Verneita CROME, MD  Active Pharmacy Records  DULoxetine  (CYMBALTA ) 60 MG capsule [id number] Yes Take 60 mg by mouth daily. [provider]  Active   ezetimibe  (ZETIA ) 10 MG tablet [id number] Yes Take 10 mg by mouth daily. [provider]  Active   Finerenone  (KERENDIA ) 10 MG TABS [id number] Yes Take 1 tablet (10 mg total) by mouth in the morning. [place] A, FNP  Active   fluconazole  (DIFLUCAN ) 100 MG tablet [id number] Yes Take 1 tablet (100 mg total) by mouth daily. X 7 days Helon Kirsch A, PA-C  Active   fluticasone  (FLONASE ) 50 MCG/ACT nasal spray [id number] Yes Place 2 sprays into both nostrils daily. Maribeth Camellia MATSU, MD  Active Pharmacy Records  glucose blood [facility] VERIO) test strip [id number] Yes Use to check blood sugar twice daily. Marylynn Verneita CROME, MD  Active Pharmacy Records  insulin  aspart (NOVOLOG  FLEXPEN) 100 UNIT/ML FlexPen [id number] Yes Inject 8-14 Units into the skin 3 (three) times daily  with meals. 90-150= 8 units; 151-200=9 units; 201-250=10 units; 251-300= 11 units; 301-350= 12 units; 351-400=13 units; 401 or higher =14 units Reardon, Benton PARAS, NP  Active   insulin  degludec (TRESIBA ) 100 UNIT/ML FlexTouch Pen [id number] Yes Inject 15 Units into the skin at bedtime. Therisa Benton PARAS, NP  Active   Insulin  Pen Needle (DROPLET PEN NEEDLES) 31G X 5 MM MISC [id number] Yes USE ONE NEEDLE SUBCUTANEOUSLY AS DIRECTED (REMOVE AND DISCARD NEEDLE IN SHARPS CONTAINER IMMEDIATELY AFTER USE) Therisa Benton PARAS, NP  Active Pharmacy Records  levothyroxine  (SYNTHROID ) 25 MCG tablet [id number] Yes Take 25 mcg by mouth daily before breakfast. [provider]  Active   lidocaine  (LIDODERM ) 5 % [id number] Yes Place 1 patch onto the skin daily. Remove & Discard patch within 12 hours or as directed by MD [provider]  Active   loperamide  (IMODIUM ) 2 MG capsule [id number] Yes Take 4-6 mg by mouth 3 (three) times daily before meals.  Patient taking differently: Take 4-6 mg by mouth 3 (three) times daily before meals. As needed   [provider]  Active Pharmacy Records  midodrine (PROAMATINE) 5 MG tablet [id number] Yes Take 5 mg by mouth as needed. [provider]  Active   nitroGLYCERIN  (NITROSTAT ) 0.4 MG SL tablet [id number] Yes Place 0.4 mg under the tongue every 5 (five) minutes as needed for chest pain. [provider]  Active   nystatin  (MYCOSTATIN /NYSTOP ) powder [id number] Yes Apply 1 g topically 4 (four) times daily as needed (rash). [provider]  Active Pharmacy Records  ondansetron  (ZOFRAN ) 4 MG tablet [id number] Yes Take 4 mg by mouth every 8 (eight) hours as needed for nausea or vomiting. [provider]  Active   OneTouch Delica Lancets 33G MISC [id number] Yes Used to check blood sugar two times a day. Marylynn Verneita CROME, MD  Active Pharmacy Records  oxycodone  (OXY-IR) 5 MG capsule [id number] Yes Take 5 mg by mouth every 4 (four) hours as  needed. 2.5 mg as needed every 6 hours [provider]  Active   pantoprazole  (PROTONIX ) 40 MG tablet [id number] Yes Take 40 mg by mouth daily. [provider]  Active   phenylephrine -shark liver oil-mineral oil-petrolatum (PREPARATION H) 0.25-14-74.9 % rectal ointment [id number] Yes Place 1  Application rectally 2 (two) times daily as needed for hemorrhoids (prn every 8 hours). [provider]  Active   Polyethyl Glycol-Propyl Glycol (SYSTANE OP) [id number]  Place 1 drop into both eyes 4 (four) times daily as needed (dry eyes).  Patient not taking: Reported on 07/07/2024   [provider]  Active Pharmacy Records  pregabalin  (LYRICA ) 50 MG capsule [id number] Yes Take 1 capsule (50 mg total) by mouth 2 (two) times daily. Marylynn Verneita CROME, MD  Active Pharmacy Records  sitaGLIPtin  (JANUVIA ) 50 MG tablet [id number] Yes Take 1 tablet (50 mg total) by mouth daily. Therisa Benton PARAS, NP  Active   sitaGLIPtin  (JANUVIA ) 50 MG tablet [id number] Yes Take 50 mg by mouth daily. [provider]  Active   torsemide  (DEMADEX ) 20 MG tablet [id number]  Take 20 mg by mouth daily.  Patient not taking: Reported on 07/07/2024   [provider]  Active   trolamine salicylate (ASPERCREME) 10 % cream [id number]  Apply 1 Application topically daily.  Patient not taking: Reported on 07/07/2024   [provider]  Active   Med List Note Maryelizabeth Lucie ONEIDA Bishop 07/22/17 1307): CPAP            Recommendation:   PCP Follow-up Specialty provider follow-up  [facility], [facility] Care [facility]  Follow Up Plan:   Telephone follow up appointment date/time: 07/15/2024 at 10:30am.  Murray Shawl, LCSW [facility], [facility] Licensed Clinical Social Worker Direct Dial : [phone]

## 2024-07-13 ENCOUNTER — Ambulatory Visit: Attending: Medical | Admitting: Medical

## 2024-07-13 ENCOUNTER — Telehealth (INDEPENDENT_AMBULATORY_CARE_PROVIDER_SITE_OTHER): Payer: Self-pay

## 2024-07-13 ENCOUNTER — Telehealth: Admitting: *Deleted

## 2024-07-13 ENCOUNTER — Other Ambulatory Visit: Payer: Self-pay | Admitting: *Deleted

## 2024-07-13 ENCOUNTER — Ambulatory Visit
Admission: RE | Admit: 2024-07-13 | Discharge: 2024-07-13 | Disposition: A | Discharge status: Home / Self Care | Source: Ambulatory Visit | Attending: Nurse Practitioner | Admitting: Nurse Practitioner

## 2024-07-13 ENCOUNTER — Encounter: Payer: Self-pay | Admitting: Medical

## 2024-07-13 ENCOUNTER — Telehealth: Payer: Self-pay

## 2024-07-13 ENCOUNTER — Other Ambulatory Visit (INDEPENDENT_AMBULATORY_CARE_PROVIDER_SITE_OTHER): Payer: Self-pay | Admitting: Nurse Practitioner

## 2024-07-13 VITALS — BP 120/70 | HR 84 | Ht 64.5 in | Wt 219.0 lb

## 2024-07-13 DIAGNOSIS — Z794 Long term (current) use of insulin: Secondary | ICD-10-CM

## 2024-07-13 DIAGNOSIS — I480 Paroxysmal atrial fibrillation: Secondary | ICD-10-CM | POA: Insufficient documentation

## 2024-07-13 DIAGNOSIS — I5032 Chronic diastolic (congestive) heart failure: Secondary | ICD-10-CM | POA: Diagnosis not present

## 2024-07-13 DIAGNOSIS — I25758 Atherosclerosis of native coronary artery of transplanted heart with other forms of angina pectoris: Secondary | ICD-10-CM | POA: Diagnosis not present

## 2024-07-13 DIAGNOSIS — I10 Essential (primary) hypertension: Secondary | ICD-10-CM | POA: Insufficient documentation

## 2024-07-13 DIAGNOSIS — N183 Chronic kidney disease, stage 3 unspecified: Secondary | ICD-10-CM | POA: Diagnosis not present

## 2024-07-13 DIAGNOSIS — D509 Iron deficiency anemia, unspecified: Secondary | ICD-10-CM | POA: Diagnosis not present

## 2024-07-13 DIAGNOSIS — E1142 Type 2 diabetes mellitus with diabetic polyneuropathy: Secondary | ICD-10-CM

## 2024-07-13 DIAGNOSIS — E1165 Type 2 diabetes mellitus with hyperglycemia: Secondary | ICD-10-CM

## 2024-07-13 DIAGNOSIS — T8789 Other complications of amputation stump: Secondary | ICD-10-CM | POA: Diagnosis present

## 2024-07-13 DIAGNOSIS — T8189XA Other complications of procedures, not elsewhere classified, initial encounter: Secondary | ICD-10-CM | POA: Diagnosis present

## 2024-07-13 DIAGNOSIS — E559 Vitamin D deficiency, unspecified: Secondary | ICD-10-CM

## 2024-07-13 DIAGNOSIS — E278 Other specified disorders of adrenal gland: Secondary | ICD-10-CM

## 2024-07-13 DIAGNOSIS — E782 Mixed hyperlipidemia: Secondary | ICD-10-CM

## 2024-07-13 DIAGNOSIS — Z89512 Acquired absence of left leg below knee: Secondary | ICD-10-CM | POA: Insufficient documentation

## 2024-07-13 DIAGNOSIS — E1122 Type 2 diabetes mellitus with diabetic chronic kidney disease: Secondary | ICD-10-CM | POA: Insufficient documentation

## 2024-07-13 LAB — POCT I-STAT CREATININE: Creatinine, Ser: 1 mg/dL (ref 0.44–1.00)

## 2024-07-13 MED ORDER — APIXABAN 5 MG PO TABS: 5.0000 mg | ORAL_TABLET | Freq: Two times a day (BID) | ORAL | 3 refills | Status: AC

## 2024-07-13 MED ORDER — TORSEMIDE 20 MG PO TABS: 20.0000 mg | ORAL_TABLET | Freq: Every day | ORAL | 3 refills | Status: AC

## 2024-07-13 MED ORDER — SITAGLIPTIN PHOSPHATE 50 MG PO TABS: 50.0000 mg | ORAL_TABLET | Freq: Every day | ORAL | 1 refills | Status: DC

## 2024-07-13 MED ORDER — EZETIMIBE 10 MG PO TABS: 10.0000 mg | ORAL_TABLET | Freq: Every day | ORAL | 3 refills | Status: AC

## 2024-07-13 MED ORDER — FREESTYLE LIBRE 3 PLUS SENSOR MISC: 3 refills | Status: DC

## 2024-07-13 MED ORDER — IOHEXOL 300 MG/ML  SOLN
100.0000 mL | Freq: Once | INTRAMUSCULAR | Status: AC | PRN
  Administered 2024-07-13: 100 mL via INTRAVENOUS

## 2024-07-13 MED ORDER — ATORVASTATIN CALCIUM 80 MG PO TABS: 80.0000 mg | ORAL_TABLET | Freq: Every day | ORAL | 3 refills | Status: AC

## 2024-07-13 MED ORDER — NITROGLYCERIN 0.4 MG SL SUBL: 0.4000 mg | SUBLINGUAL_TABLET | SUBLINGUAL | 3 refills | Status: AC | PRN

## 2024-07-13 NOTE — Telephone Encounter (Signed)
 Spoke with Dorthea she wanted to confirm orders to restart [facility] for patient advised her per most recent note that would be ok for dressing changes. She further reports patient had a question on her lab work, per chart the only labs I see were recent culture that has been sent out already no blood work orders. Dorthea reports patient will be coming by the office today to have her labs drawn. Please advise

## 2024-07-13 NOTE — Patient Instructions (Signed)
 Medication Instructions:  Your physician recommends that you continue on your current medications as directed. Please refer to the Current Medication list given to you today.    *If you need a refill on your cardiac medications before your next appointment, please call your pharmacy*  Lab Work: No labs ordered today    Testing/Procedures: No test ordered today   Follow-Up: At [facility], you and your health needs are our priority.  As part of our continuing mission to provide you with exceptional heart care, our providers are all part of one team.  This team includes your primary Cardiologist (physician) and Advanced Practice Providers or APPs (Physician Assistants and Nurse Practitioners) who all work together to provide you with the care you need, when you need it.  Your next appointment:   6 month(s)  Provider:   Lonni Hanson, MD or Cadence Franchester, PA-C

## 2024-07-13 NOTE — Progress Notes (Unsigned)
 " Cardiology Office Note   Date:  07/14/2024  ID:  Denise Sharp, Denise Sharp 1946-05-23, MRN [id number] PCP: Marylynn Verneita CROME, MD  [facility] Providers Cardiologist:  Lonni Hanson, MD   History of Present Illness Denise Sharp is a 78 y.o. 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    The patient was admitted in September 2024 due to 30 pound weight gain requiring IV diuresis at that time she was taking Lasix  20 mg once daily.  She was admitted to [facility] where echo showed preserved EF with grade 2 diastolic dysfunction.  She was diuresed 4 L and transition to torsemide  20 mg daily.  Throughout admission she maintained normal sinus rhythm.  She did have some rectal bleeding that was secondary to hemorrhoids.    Patient has been followed by advanced heart failure team.  She was seen May 24, 2023 reporting stable weights 241 to 243 pounds.  She was wearing ZOLL heart failure monitoring device.  She was taking torsemide  60 mg most days.she last saw CHF team 07/01/23 and finerenone  was refilled. She was on torsemide  BID, and this was decreased to 60mg  daily.  The patient was last seen by heart failure team 01/27/24 and torsemide  was decreased to 20mg  daily.   In 02/2024 she was admitted for the hospital for gangrene of the left foot with cellulitis and possible osteomyelitis and underwent left BKA amputation. Heart meds were held for hypotension. Since then she had delayed healing of the wound and recurrent infection. She was discharged to a nursing home.  Today, the patient is overall doing OK. She is waiting for CT scan of left stump to see if there is an infection in it. Blood pressures are normal. She has no bladder control and has not been taking Torsemide . She has home health nurse and PT, but no nurse aids. She denies chest  pain, SOB, lower leg edema. She was in a nursing home and BP was low and started on midodrine.   Studies Reviewed EKG Interpretation Date/Time:  Monday July 13 2024 14:25:30 EST Ventricular Rate:  84 PR Interval:    QRS Duration:  64 QT Interval:  386 QTC Calculation: 456 R Axis:   -3  Text Interpretation: Normal sinus rhythm Premature ventricular complexes Premature atrial complexes Low voltage QRS When compared with ECG of 31-Dec-2023 15:12, Atrial fibrillation has replaced Sinus rhythm Confirmed by Franchester, Peyten Weare ([id number]) on 07/13/2024 2:30:39 PM    Echo 03/25/12: EF 55-65% with mild AR Echo 08/21/17: EF 65-70% with Grade II DD and mildly elevated PA pressure of 33 mmHg Echo 12/30/19: EF 65-70% with Grade II DD Echo 03/29/23: EF 60-65% with mild LVH, Grade II DD   RHC/ LHC 06/04/22: Mild-moderate, non-obstructive coronary artery disease, similar to prior catheterization in 08/2020. Widely patent ostial LAD stent. Patent mid LAD stent with minimal in stent restenosis (~10% narrowing). Upper normal left and right heart filling pressures. Borderline elevated pulmonary artery pressure. Normal to mildly reduced cardiac output/index.       Physical Exam VS:  BP 120/70 (BP Location: Right Arm, Patient Position: Sitting, Cuff Size: Normal)   Pulse 84   Ht 5' 4.5 (1.638 m)   Wt 219 lb (99.3 kg)   SpO2 96%   BMI 37.01 kg/m  Wt Readings from Last 3 Encounters:  07/13/24 219 lb (99.3 kg)  06/29/24 212 lb (96.2 kg)  06/29/24 212 lb (96.2 kg)    GEN: Well nourished, well developed in no acute distress NECK: No JVD; No carotid bruits CARDIAC: RRR, no murmurs, rubs, gallops RESPIRATORY:  Clear to auscultation without rales, wheezing or rhonchi  ABDOMEN: Soft, non-tender, non-distended EXTREMITIES:  No edema; L BKA  ASSESSMENT AND PLAN  NICM Torsemide , Farxiga  and finerenone  stopped due to hypotension. She was given midodrine to take PRN. She is euvolemic on exam. We will  try and start Finerenone  10mg  at next visit. She follows with the heart failure team.   HTN BP is 120/70. She has midodrine to take as needed. She has taken it 1 time.   Pafib She is in NSR with PACS and PVCS. Continue Eliquis  5mg  BID.   CAD Prior cardiac PET Stress was normal with no ischemia or prior infarction. No chest pain reported. Continue Lipitor . No ASA given DOAC.  Iron  def anemia Hgb 11-12. Continue to monitor CBC.  CKD stage 3 Scr 1.3  on most recent check.   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 She is being followed closely by vascular surgery.       Dispo: Follow-up in 6 months  Signed, Lurene Robley VEAR Fishman, PA-C   "

## 2024-07-13 NOTE — Telephone Encounter (Signed)
 Call to patient was unable to reach left her a vm to advise she has no blood work orders outstanding at our office and that her culture came back negative.

## 2024-07-13 NOTE — Telephone Encounter (Signed)
 She won't be coming to this office, I think she is referring to cardiology, so she should defer questions there.  As far as her Lab work here, her culture was negative.

## 2024-07-13 NOTE — Telephone Encounter (Signed)
 Copied from CRM [id number]. Topic: General - Other >> Jul 13, 2024  1:03 PM Geneva B wrote: Reason for CRM: [facility] calling wanting approve to accept orders from [facility]  [phone]

## 2024-07-14 NOTE — Telephone Encounter (Signed)
 Verbal orders given to Leeanna at [facility]

## 2024-07-14 NOTE — Telephone Encounter (Signed)
 Spoke with [facility] and they are wanting to know if they could get verbal orders for nursing added to pt's plan of care. The clinical manager stated that pt has an incision that needs dressing changes. Is it okay to give verbal order for nursing?

## 2024-07-15 ENCOUNTER — Other Ambulatory Visit: Payer: Self-pay | Admitting: Nurse Practitioner

## 2024-07-15 ENCOUNTER — Other Ambulatory Visit: Payer: Self-pay

## 2024-07-15 ENCOUNTER — Telehealth: Payer: Self-pay

## 2024-07-15 MED ORDER — SITAGLIPTIN 50 MG PO TABS: 50.0000 mg | ORAL_TABLET | Freq: Every day | ORAL | 1 refills | Status: DC

## 2024-07-15 NOTE — Patient Outreach (Addendum)
 Complex Care Management   Visit Note  07/15/2024  Name:  Denise Sharp MRN: [id number] DOB: 1946/02/14  Situation: Referral received for Complex Care Management related to SDOH Barriers:  Financial Resource Strain I obtained verbal consent from Patient.  Visit completed with Patient  on the phone  Background:   Past Medical History:  Diagnosis Date   (HFpEF) heart failure with preserved ejection fraction (HCC)    a. 12/2019 Echo: EF 65-70%, Gr2 DD. No significant valvular dzs.   Abnormality of gait 03/25/2013   Adrenal mass, left    Anginal pain    Anxiety    Aortic atherosclerosis    Arthritis    Asthma    CAD (coronary artery disease) with h/o Atypical Chest Pain    a. 04/1986 Cath ([facility]): nl cors, EF 65%; b. 07/2012 Cath [facility]): Diff minor irregs; c. 07/2016 [facility]): Equivocal; d. 08/2016 Cardiac CT Ca2+ score [facility]): Ca2+ 1548; e. 05/2019 [facility]: No ischemia. EF 75%; f. 12/2019 PCI: LM nl, LAD 65ost (3.5x12 Resolute DES), 34m (2.75x12 Resolute DES),LCX/RCA nl; g. 08/2020 Cath: patent LAD stents, RCA 40ost, elev RH pressures, EF >65%.   Cervical spinal stenosis 1994   due to trauma to back ([employer] accident), has intermittent paralysis and parasthesias   Cervicogenic headache 03/23/2014   CKD (chronic kidney disease), stage III (HCC)    Depression    Diverticulosis    Dizziness    a.) chronic   DJD (degenerative joint disease)    a. Chronic R shoulder pain   Dyspnea    Esophageal stenosis 09/2009   a.) transient outlet obstruction by food, cleared by EGD   Family history of adverse reaction to anesthesia    a.) daughter with (+) PONV   Gastric bypass status for obesity    GERD (gastroesophageal reflux disease)    Gout    Headache(784.0)    HLD (hyperlipidemia)    Hypertension    IBS (irritable bowel syndrome)    IDA (iron  deficiency anemia)    a.) post 2 unit txfsn 2009, normal endo/colonoscopy by [facility]   Left bundle branch block (LBBB)    a.) Intermittently present -  likely rate related.   NSVT (nonsustained ventricular tachycardia) (HCC) 11/15/2020   a.) Holter study 11/15/2020; 11 episodes of NSVT lasting up to 8 beats with a maximum rate of 210 bpm; 14 atrial runs lasting up to 18 beats with a rate of up to 250 bpm.  Some atrial runs felt to be NSVT.   NSVT (nonsustained ventricular tachycardia) (HCC)    a. 10/2020 [facility]: 11 runs of NSVT up to 8 beats.   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 10/2020 [facility]: 14 atrial runs up to 18 beats, max HR 250.   Restless leg syndrome    Rotator cuff arthropathy, right 08/13/2017   Syncope and collapse 03/12/2014   Type II diabetes mellitus (HCC)     Assessment: Follow up phone call to Patient. She and spouse have not yet been able to reach a child psychotherapist at the [facility] to inquire about aide services. They intend to keep trying until they are able to reach someone. Patient shared that she has received as a gift a bedside commode through a ladies auxiliary group.  LCSW will follow up again in one week.  Patient Reported Symptoms:  Cognitive Cognitive  Status: No symptoms reported, Alert and oriented to person, place, and time, Able to follow simple commands, Insightful and able to interpret abstract concepts, Normal speech and language skills Cognitive/Intellectual Conditions Management [RPT]: None reported or documented in medical history or problem list   Health Maintenance Behaviors: Annual physical exam Healing Pattern: Slow Health Facilitated by: Rest, Pain control  Neurological Neurological Review of Symptoms: No symptoms reported    HEENT HEENT Symptoms Reported: No symptoms reported      Cardiovascular Cardiovascular Symptoms Reported: No symptoms reported    Respiratory Respiratory Symptoms Reported: No symptoms reported    Endocrine Endocrine Symptoms  Reported: Not assessed    Gastrointestinal Gastrointestinal Symptoms Reported: No symptoms reported      Genitourinary Genitourinary Symptoms Reported: No symptoms reported    Integumentary Integumentary Symptoms Reported: Not assessed    Musculoskeletal Musculoskelatal Symptoms Reviewed: Not assessed        Psychosocial Psychosocial Symptoms Reported: No symptoms reported          07/15/2024    PHQ2-9 Depression Screening   Little interest or pleasure in doing things    Feeling down, depressed, or hopeless    PHQ-2 - Total Score    Trouble falling or staying asleep, or sleeping too much    Feeling tired or having little energy    Poor appetite or overeating     Feeling bad about yourself - or that you are a failure or have let yourself or your family down    Trouble concentrating on things, such as reading the newspaper or watching television    Moving or speaking so slowly that other people could have noticed.  Or the opposite - being so fidgety or restless that you have been moving around a lot more than usual    Thoughts that you would be better off dead, or hurting yourself in some way    PHQ2-9 Total Score    If you checked off any problems, how difficult have these problems made it for you to do your work, take care of things at home, or get along with other people    Depression Interventions/Treatment      There were no vitals filed for this visit. Pain Score: 0-No pain  Medications Reviewed Today     Reviewed by Angelena Finis HERO, LCSW (Social Worker) on 07/15/24 at 1626  Med List Status: <None>   Medication Order Taking? Sig Documenting Provider Last Dose Status Informant  acetaminophen  (TYLENOL ) 325 MG tablet [id number] Yes Take 2 tablets (650 mg total) by mouth every 6 (six) hours as needed for mild pain (pain score 1-3). Jens Durand, MD  Active   allopurinol  (ZYLOPRIM ) 100 MG tablet [id number] Yes Take 100 mg by mouth daily. [provider]   Active   apixaban  (ELIQUIS ) 5 MG TABS tablet [id number] Yes Take 1 tablet (5 mg total) by mouth 2 (two) times daily. Furth, Cadence H, PA-C  Active   atorvastatin  (LIPITOR ) 80 MG tablet [id number] Yes Take 1 tablet (80 mg total) by mouth daily. Furth, Cadence H, PA-C  Active   cephALEXin  (KEFLEX ) 500 MG capsule [id number]  Take 1 capsule (500 mg total) by mouth 2 (two) times daily.  Patient not taking: Reported on 07/15/2024   Narendra, Nischal, MD  Active   cetirizine (ZYRTEC) 10 MG tablet [id number] Yes Take 10 mg by mouth daily. [provider]  Active   Cholecalciferol  (VITAMIN D3) 1.25 MG (50000 UT) CAPS [id number] Yes Take 5,000 Units by mouth  daily. [provider]  Active   clotrimazole -betamethasone  (LOTRISONE ) cream [id number] Yes Apply 1 application topically 2 (two) times daily as needed (irritation). [provider]  Active Pharmacy Records  colchicine  0.6 MG tablet [id number] Yes Take 0.6 mg by mouth daily. [provider]  Active   conjugated estrogens  (PREMARIN ) vaginal cream [id number] Yes Apply one pea-sized amount around the opening of the urethra daily for 2 weeks, then 3 times weekly moving forward. Vaillancourt, Samantha, PA-C  Active Pharmacy Records  Continuous Glucose Sensor (FREESTYLE LIBRE 3 PLUS SENSOR) [place] [id number] Yes Change sensor every 15 days. Therisa Benton PARAS, NP  Active   dicyclomine  (BENTYL ) 20 MG tablet [id number] Yes Take 1 tablet (20 mg total) by mouth 3 (three) times daily before meals. Marylynn Verneita CROME, MD  Active Pharmacy Records  DULoxetine  (CYMBALTA ) 60 MG capsule [id number] Yes Take 60 mg by mouth daily. [provider]  Active   ezetimibe  (ZETIA ) 10 MG tablet [id number] Yes Take 1 tablet (10 mg total) by mouth daily. Furth, Cadence H, PA-C  Active   Finerenone  (KERENDIA ) 10 MG TABS [id number]  Take 1 tablet (10 mg total) by mouth in the morning.  Patient not taking: Reported on 07/15/2024   Donette Ellouise LABOR, FNP  Active    fluconazole  (DIFLUCAN ) 100 MG tablet [id number] Yes Take 1 tablet (100 mg total) by mouth daily. X 7 days Helon Kirsch A, PA-C  Active   fluticasone  (FLONASE ) 50 MCG/ACT nasal spray [id number] Yes Place 2 sprays into both nostrils daily. Maribeth Camellia MATSU, MD  Active Pharmacy Records  glucose blood [facility] VERIO) test strip [id number] Yes Use to check blood sugar twice daily. Marylynn Verneita CROME, MD  Active Pharmacy Records  insulin  aspart (NOVOLOG  FLEXPEN) 100 UNIT/ML FlexPen [id number]  Inject 8-14 Units into the skin 3 (three) times daily with meals. 90-150= 8 units; 151-200=9 units; 201-250=10 units; 251-300= 11 units; 301-350= 12 units; 351-400=13 units; 401 or higher =14 units  Patient not taking: Reported on 07/15/2024   Therisa Benton PARAS, NP  Active   insulin  degludec (TRESIBA ) 100 UNIT/ML FlexTouch Pen [id number]  Inject 15 Units into the skin at bedtime.  Patient not taking: Reported on 07/15/2024   Therisa Benton PARAS, NP  Active   Insulin  Pen Needle (DROPLET PEN NEEDLES) 31G X 5 MM MISC [id number] Yes USE ONE NEEDLE SUBCUTANEOUSLY AS DIRECTED (REMOVE AND DISCARD NEEDLE IN SHARPS CONTAINER IMMEDIATELY AFTER USE) Therisa Benton PARAS, NP  Active Pharmacy Records  levothyroxine  (SYNTHROID ) 25 MCG tablet [id number] Yes Take 25 mcg by mouth daily before breakfast. [provider]  Active   lidocaine  (LIDODERM ) 5 % [id number] Yes Place 1 patch onto the skin daily. Remove & Discard patch within 12 hours or as directed by MD [provider]  Active   loperamide  (IMODIUM ) 2 MG capsule [id number] Yes Take 4-6 mg by mouth 3 (three) times daily before meals.  Patient taking differently: Take 4-6 mg by mouth 3 (three) times daily before meals. As needed   [provider]  Active Pharmacy Records  midodrine (PROAMATINE) 5 MG tablet [id number] Yes Take 5 mg by mouth as needed. [provider]  Active   nitroGLYCERIN  (NITROSTAT ) 0.4 MG SL tablet [id number] Yes Place 1 tablet  (0.4 mg total) under the tongue every 5 (five) minutes as needed for chest pain. Furth, Cadence H, PA-C  Active   nystatin  (MYCOSTATIN /NYSTOP ) powder [id number] Yes Apply 1 g topically 4 (four) times daily as needed (rash). [provider]  Active Pharmacy Records  ondansetron  (ZOFRAN ) 4 MG tablet [id number] Yes Take 4 mg by mouth every 8 (eight) hours as needed for nausea or vomiting. [provider]  Active   OneTouch Delica Lancets 33G MISC [id number] Yes Used to check blood sugar two times a day. Marylynn Verneita CROME, MD  Active Pharmacy Records  oxycodone  (OXY-IR) 5 MG capsule [id number] Yes Take 5 mg by mouth every 4 (four) hours as needed. 2.5 mg as needed every 6 hours [provider]  Active   pantoprazole  (PROTONIX ) 40 MG tablet [id number] Yes Take 40 mg by mouth daily. [provider]  Active   phenylephrine -shark liver oil-mineral oil-petrolatum (PREPARATION H) 0.25-14-74.9 % rectal ointment [id number] Yes Place 1 Application rectally 2 (two) times daily as needed for hemorrhoids (prn every 8 hours). [provider]  Active   Polyethyl Glycol-Propyl Glycol (SYSTANE OP) [id number] Yes Place 1 drop into both eyes 4 (four) times daily as needed (dry eyes). [provider]  Active Pharmacy Records  pregabalin  (LYRICA ) 50 MG capsule [id number] Yes Take 1 capsule (50 mg total) by mouth 2 (two) times daily. Marylynn Verneita CROME, MD  Active Pharmacy Records  sitaGLIPtin  (JANUVIA ) 50 MG tablet [id number] Yes Take 50 mg by mouth daily. [provider]  Active   SITagliptin  (ZITUVIO ) 50 MG TABS [id number] Yes Take 50 mg by mouth daily. Therisa Benton PARAS, NP  Active   torsemide  (DEMADEX ) 20 MG tablet [id number] Yes Take 1 tablet (20 mg total) by mouth daily. Furth, Cadence H, PA-C  Active   trolamine salicylate (ASPERCREME) 10 % cream [id number] Yes Apply 1 Application topically daily. [provider]  Active   Med List Note Maryelizabeth Lucie ONEIDA Bishop  07/22/17 1307): CPAP            Recommendation:   PCP Follow-up Continue Current Plan of Care  Follow Up Plan:   Telephone follow-up in 1 week: July 24, 2024 at 10:00am.  Murray Shawl, LCSW [facility], [facility] Licensed Clinical Social Worker Direct Dial : [phone]

## 2024-07-15 NOTE — Telephone Encounter (Signed)
-----   Message from Benton Rio, NP sent at 07/15/2024  4:12 PM EST ----- Regarding: RE: Januvia  Should be fine to switch.  I sent in script to [facility]. ----- Message ----- From: Marlyn Anton HERO, CMA Sent: 07/15/2024   3:58 PM EST To: Benton JINNY Rio, NP Subject: Januvia                                         [facility] - [place], [place] - [address] [facility] is wanting an Rx change request since pharmacy does not have Sitagliptin  phosphate (Eqv-Januvia ), however they do have in stock Sitagliptin  (Eqv to Zituvio ). If you want to proceed with this change please send to pharmacy. If do not want to change will need to local pharmacy. Please advise on this?

## 2024-07-15 NOTE — Patient Instructions (Addendum)
 Visit Information  Thank you for taking time to visit with me today. Please don't hesitate to contact me if I can be of assistance to you before our next scheduled appointment.  Your next care management appointment is by telephone on January 9,2026 at 10:00AM.  Please call the care guide team at [phone] if you need to cancel, schedule, or reschedule an appointment.   Please call the Suicide and Crisis Lifeline: 988 call the [place]  National Suicide Prevention Lifeline: (564)644-0884 or TTY: 762-513-2683 TTY (830)324-6252) to talk to a trained counselor call 1-800-273-TALK (toll free, 24 hour hotline) call 911 if you are experiencing a Mental Health or Behavioral Health Crisis or need someone to talk to.  Murray Shawl, LCSW [facility], [facility] Licensed Clinical Social Worker Direct Dial : [phone]

## 2024-07-20 ENCOUNTER — Ambulatory Visit: Admitting: Internal Medicine

## 2024-07-20 ENCOUNTER — Encounter: Payer: Self-pay | Admitting: Internal Medicine

## 2024-07-20 ENCOUNTER — Telehealth: Payer: Self-pay

## 2024-07-20 VITALS — BP 108/64 | HR 59 | Ht 64.5 in | Wt 219.0 lb

## 2024-07-20 DIAGNOSIS — E1149 Type 2 diabetes mellitus with other diabetic neurological complication: Secondary | ICD-10-CM | POA: Diagnosis not present

## 2024-07-20 DIAGNOSIS — E039 Hypothyroidism, unspecified: Secondary | ICD-10-CM | POA: Diagnosis not present

## 2024-07-20 DIAGNOSIS — N3945 Continuous leakage: Secondary | ICD-10-CM

## 2024-07-20 DIAGNOSIS — Z89512 Acquired absence of left leg below knee: Secondary | ICD-10-CM | POA: Diagnosis not present

## 2024-07-20 DIAGNOSIS — R2689 Other abnormalities of gait and mobility: Secondary | ICD-10-CM

## 2024-07-20 NOTE — Patient Instructions (Addendum)
 Increase your evening meal BS to a base of 10 units.  Keep the other doses the same   I will send an order for the Purewick catheter to [facility] and try to get you  a morotized wheelchair

## 2024-07-20 NOTE — Telephone Encounter (Signed)
 Copied from CRM [id number]. Topic: Clinical - Home Health Verbal Orders >> Jul 20, 2024 10:59 AM Laymon HERO wrote: Caller/Agency: Sharlet Coder- [facility] Callback Number: [phone] Service Requested: Lower leg amputation care Frequency: 2x2, 1x1 and 2 PRN Any new concerns about the patient? No

## 2024-07-20 NOTE — Assessment & Plan Note (Signed)
 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

## 2024-07-20 NOTE — Progress Notes (Signed)
 "  Subjective:  Patient ID: Denise Sharp, female    DOB: 1946/03/07  Age: 79 y.o. MRN: [id number]  CC: The primary encounter diagnosis was Type II diabetes mellitus with neurological manifestations (HCC). Diagnoses of Hypocalcemia, Acquired hypothyroidism, Continuous leakage of urine, S/P BKA (below knee amputation) unilateral, left (HCC), Decreased functional mobility, and Decreased mobility were also pertinent to this visit.   HPI Denise Sharp presents for mobility evaluation    On the visit date of Jul 20 2024, Denise Sharp  was evaluated for power mobility needs. This patient has mobility limitation due to  recent BKA of left leg secondary to osteomyelitis of infected diabetic ulcer and has not been able to use a prosthetic  due to persistent .recurrent  stump infection. Her mobility  cannot be resolved by use of a fitted cane or walker. This patient does not have sufficient arm function, hand grip, and ROM to propel a wheelchair to perform mobility related ADL's. This patient does not have enough stability of torso to remain sitting up without back support , therefore  A power  scooter would not work.  She could benefit from use of a power  wheelchair and can safely transfer to and from a power wheelchair and operate the controls.   Outpatient Medications Prior to Visit  Medication Sig Dispense Refill   acetaminophen  (TYLENOL ) 325 MG tablet Take 2 tablets (650 mg total) by mouth every 6 (six) hours as needed for mild pain (pain score 1-3).     allopurinol  (ZYLOPRIM ) 100 MG tablet Take 100 mg by mouth daily.     apixaban  (ELIQUIS ) 5 MG TABS tablet Take 1 tablet (5 mg total) by mouth 2 (two) times daily. 180 tablet 3   atorvastatin  (LIPITOR ) 80 MG tablet Take 1 tablet (80 mg total) by mouth daily. 90 tablet 3   cetirizine (ZYRTEC) 10 MG tablet Take 10 mg by mouth daily.     Cholecalciferol  (VITAMIN D3) 1.25 MG (50000 UT) CAPS Take 5,000 Units by mouth daily.      clotrimazole -betamethasone  (LOTRISONE ) cream Apply 1 application topically 2 (two) times daily as needed (irritation).     colchicine  0.6 MG tablet Take 0.6 mg by mouth daily.     conjugated estrogens  (PREMARIN ) vaginal cream Apply one pea-sized amount around the opening of the urethra daily for 2 weeks, then 3 times weekly moving forward. 30 g 4   Continuous Glucose Sensor (FREESTYLE LIBRE 3 PLUS SENSOR) MISC Change sensor every 15 days. 6 each 3   dicyclomine  (BENTYL ) 20 MG tablet Take 1 tablet (20 mg total) by mouth 3 (three) times daily before meals. 180 tablet 2   DULoxetine  (CYMBALTA ) 60 MG capsule Take 60 mg by mouth daily.     ezetimibe  (ZETIA ) 10 MG tablet Take 1 tablet (10 mg total) by mouth daily. 90 tablet 3   Finerenone  (KERENDIA ) 10 MG TABS Take 1 tablet (10 mg total) by mouth in the morning. 90 tablet 3   fluconazole  (DIFLUCAN ) 100 MG tablet Take 1 tablet (100 mg total) by mouth daily. X 7 days 7 tablet 0   fluticasone  (FLONASE ) 50 MCG/ACT nasal spray Place 2 sprays into both nostrils daily. 16 g 6   glucose blood (ONETOUCH VERIO) test strip Use to check blood sugar twice daily. 100 each 1   insulin  aspart (NOVOLOG  FLEXPEN) 100 UNIT/ML FlexPen Inject 8-14 Units into the skin 3 (three) times daily with meals. 90-150= 8 units; 151-200=9 units; 201-250=10 units; 251-300= 11 units; 301-350=  12 units; 351-400=13 units; 401 or higher =14 units 36 mL 3   insulin  degludec (TRESIBA ) 100 UNIT/ML FlexTouch Pen Inject 15 Units into the skin at bedtime. 15 mL 1   Insulin  Pen Needle (DROPLET PEN NEEDLES) 31G X 5 MM MISC USE ONE NEEDLE SUBCUTANEOUSLY AS DIRECTED (REMOVE AND DISCARD NEEDLE IN SHARPS CONTAINER IMMEDIATELY AFTER USE) 100 each 1   levothyroxine  (SYNTHROID ) 25 MCG tablet Take 25 mcg by mouth daily before breakfast.     lidocaine  (LIDODERM ) 5 % Place 1 patch onto the skin daily. Remove & Discard patch within 12 hours or as directed by MD     loperamide  (IMODIUM ) 2 MG capsule Take 4-6 mg by  mouth 3 (three) times daily before meals. (Patient taking differently: Take 4-6 mg by mouth 3 (three) times daily before meals. As needed)     midodrine (PROAMATINE) 5 MG tablet Take 5 mg by mouth as needed.     nitroGLYCERIN  (NITROSTAT ) 0.4 MG SL tablet Place 1 tablet (0.4 mg total) under the tongue every 5 (five) minutes as needed for chest pain. 25 tablet 3   nystatin  (MYCOSTATIN /NYSTOP ) powder Apply 1 g topically 4 (four) times daily as needed (rash).     ondansetron  (ZOFRAN ) 4 MG tablet Take 4 mg by mouth every 8 (eight) hours as needed for nausea or vomiting.     OneTouch Delica Lancets 33G MISC Used to check blood sugar two times a day. 100 each 4   oxycodone  (OXY-IR) 5 MG capsule Take 5 mg by mouth every 4 (four) hours as needed. 2.5 mg as needed every 6 hours     pantoprazole  (PROTONIX ) 40 MG tablet Take 40 mg by mouth daily.     phenylephrine -shark liver oil-mineral oil-petrolatum (PREPARATION H) 0.25-14-74.9 % rectal ointment Place 1 Application rectally 2 (two) times daily as needed for hemorrhoids (prn every 8 hours).     Polyethyl Glycol-Propyl Glycol (SYSTANE OP) Place 1 drop into both eyes 4 (four) times daily as needed (dry eyes).     pregabalin  (LYRICA ) 50 MG capsule Take 1 capsule (50 mg total) by mouth 2 (two) times daily. 180 capsule 1   sitaGLIPtin  (JANUVIA ) 50 MG tablet Take 50 mg by mouth daily.     SITagliptin  (ZITUVIO ) 50 MG TABS Take 50 mg by mouth daily. 90 tablet 1   trolamine salicylate (ASPERCREME) 10 % cream Apply 1 Application topically daily.     torsemide  (DEMADEX ) 20 MG tablet Take 1 tablet (20 mg total) by mouth daily. (Patient not taking: Reported on 07/20/2024) 90 tablet 3   cephALEXin  (KEFLEX ) 500 MG capsule Take 1 capsule (500 mg total) by mouth 2 (two) times daily. (Patient not taking: Reported on 07/15/2024) 14 capsule 0   No facility-administered medications prior to visit.    Review of Systems;  Patient denies headache, fevers, malaise, unintentional  weight loss, skin rash, eye pain, sinus congestion and sinus pain, sore throat, dysphagia,  hemoptysis , cough, dyspnea, wheezing, chest pain, palpitations, orthopnea, edema, abdominal pain, nausea, melena, diarrhea, constipation, flank pain, dysuria, hematuria, urinary  Frequency, nocturia, numbness, tingling, seizures,  Focal weakness, Loss of consciousness,  Tremor, insomnia, depression, anxiety, and suicidal ideation.      Objective:  BP 108/64   Pulse (!) 59   Ht 5' 4.5 (1.638 m)   Wt 219 lb (99.3 kg)   SpO2 95%   BMI 37.01 kg/m   BP Readings from Last 3 Encounters:  07/20/24 108/64  07/13/24 120/70  07/03/24 115/70  Wt Readings from Last 3 Encounters:  07/20/24 219 lb (99.3 kg)  07/13/24 219 lb (99.3 kg)  06/29/24 212 lb (96.2 kg)    Physical Exam Vitals reviewed.  Constitutional:      General: She is not in acute distress.    Appearance: Normal appearance. She is obese. She is not ill-appearing, toxic-appearing or diaphoretic.  HENT:     Head: Normocephalic.  Eyes:     General: No scleral icterus.       Right eye: No discharge.        Left eye: No discharge.     Conjunctiva/sclera: Conjunctivae normal.  Cardiovascular:     Rate and Rhythm: Normal rate and regular rhythm.     Heart sounds: Normal heart sounds.  Pulmonary:     Effort: Pulmonary effort is normal. No respiratory distress.     Breath sounds: Normal breath sounds.  Musculoskeletal:        General: Normal range of motion.     Left Lower Extremity: Left leg is amputated below knee.  Skin:    General: Skin is warm and dry.  Neurological:     General: No focal deficit present.     Mental Status: She is alert and oriented to person, place, and time. Mental status is at baseline.  Psychiatric:        Mood and Affect: Mood normal.        Behavior: Behavior normal.        Thought Content: Thought content normal.        Judgment: Judgment normal.     Lab Results  Component Value Date   HGBA1C  7.3 05/11/2024   HGBA1C 7.6 (A) 01/07/2024   HGBA1C 9.3 (A) 09/30/2023    Lab Results  Component Value Date   CREATININE 1.00 07/13/2024   CREATININE 1.44 (H) 05/19/2024   CREATININE 1.36 (H) 03/15/2024    Lab Results  Component Value Date   WBC 12.5 (H) 05/19/2024   HGB 12.1 05/19/2024   HCT 38.9 05/19/2024   PLT 304 05/19/2024   GLUCOSE 111 (H) 05/19/2024   CHOL 90 10/09/2023   TRIG 100 10/09/2023   HDL 43 (L) 10/09/2023   LDLDIRECT 126 (H) 07/13/2022   LDLCALC 28 10/09/2023   ALT 21 05/19/2024   AST 23 05/19/2024   NA 138 05/19/2024   K 3.4 (L) 05/19/2024   CL 104 05/19/2024   CREATININE 1.00 07/13/2024   BUN 25 (H) 05/19/2024   CO2 23 05/19/2024   TSH 2.887 09/27/2022   INR 1.6 (H) 02/01/2024   HGBA1C 7.3 05/11/2024   MICROALBUR 0.4 09/30/2023    No results found.  Assessment & Plan:  .Type II diabetes mellitus with neurological manifestations (HCC) -     Basic metabolic panel with GFR  Hypocalcemia -     Magnesium  -     Calcium , ionized  Acquired hypothyroidism -     TSH  Continuous leakage of urine -     For home use only DME Other see comment  S/P BKA (below knee amputation) unilateral, left (HCC) -     For home use only DME Wheelchair electric  Decreased functional mobility -     For home use only DME Wheelchair electric  Decreased mobility Assessment & Plan: Secondary to recent BKA left leg due to osteomyelitis    This patient could benefit from use of specifically configured power wheelchair and can safely transfer to and from a power wheelchair and operate the controls.  I spent 34 minutes on the day of this face to face encounter reviewing patient's  most recent visit with cardiology,  nephrology,  and neurology,  prior relevant surgical and non surgical procedures, recent  labs and imaging studies, counseling on weight management,  reviewing the assessment and plan with patient, and post visit ordering and reviewing of  diagnostics  and therapeutics with patient  .   Follow-up: Return in about 3 months (around 10/18/2024).   Verneita LITTIE Kettering, MD "

## 2024-07-20 NOTE — Telephone Encounter (Signed)
 Okay to give wound care verbal orders?

## 2024-07-21 LAB — BASIC METABOLIC PANEL WITH GFR
BUN: 13 mg/dL (ref 6–23)
CO2: 24 meq/L (ref 19–32)
Calcium: 8.1 mg/dL — ABNORMAL LOW (ref 8.4–10.5)
Chloride: 108 meq/L (ref 96–112)
Creatinine, Ser: 1 mg/dL (ref 0.40–1.20)
GFR: 53.84 mL/min — ABNORMAL LOW
Glucose, Bld: 95 mg/dL (ref 70–99)
Potassium: 3.9 meq/L (ref 3.5–5.1)
Sodium: 140 meq/L (ref 135–145)

## 2024-07-21 LAB — CALCIUM, IONIZED: Calcium, Ion: 5 mg/dL (ref 4.7–5.5)

## 2024-07-21 LAB — TSH: TSH: 5.77 u[IU]/mL — ABNORMAL HIGH (ref 0.35–5.50)

## 2024-07-21 LAB — MAGNESIUM: Magnesium: 1.6 mg/dL (ref 1.5–2.5)

## 2024-07-21 NOTE — Telephone Encounter (Signed)
 Spoke with Sharlet and verbal orders have been given.

## 2024-07-23 ENCOUNTER — Other Ambulatory Visit: Payer: Self-pay

## 2024-07-23 NOTE — Patient Outreach (Signed)
 Complex Care Management   Visit Note  07/23/2024  Name:  Denise Sharp MRN: [id number] DOB: February 20, 1946  Situation: Referral received for Complex Care Management related to Diabetes with Complications I obtained verbal consent from Patient.  Visit completed with Patient  on the phone  Background:   Past Medical History:  Diagnosis Date   (HFpEF) heart failure with preserved ejection fraction (HCC)    a. 12/2019 Echo: EF 65-70%, Gr2 DD. No significant valvular dzs.   Abnormality of gait 03/25/2013   Adrenal mass, left    Anginal pain    Anxiety    Aortic atherosclerosis    Arthritis    Asthma    CAD (coronary artery disease) with h/o Atypical Chest Pain    a. 04/1986 Cath ([facility]): nl cors, EF 65%; b. 07/2012 Cath [facility]): Diff minor irregs; c. 07/2016 MV [facility]): Equivocal; d. 08/2016 Cardiac CT Ca2+ score [facility]): Ca2+ 1548; e. 05/2019 MV: No ischemia. EF 75%; f. 12/2019 PCI: LM nl, LAD 65ost (3.5x12 Resolute DES), 61m (2.75x12 Resolute DES),LCX/RCA nl; g. 08/2020 Cath: patent LAD stents, RCA 40ost, elev RH pressures, EF >65%.   Cervical spinal stenosis 1994   due to trauma to back ([employer] accident), has intermittent paralysis and parasthesias   Cervicogenic headache 03/23/2014   CKD (chronic kidney disease), stage III (HCC)    Depression    Diverticulosis    Dizziness    a.) chronic   DJD (degenerative joint disease)    a. Chronic R shoulder pain   Dyspnea    Esophageal stenosis 09/2009   a.) transient outlet obstruction by food, cleared by EGD   Family history of adverse reaction to anesthesia    a.) daughter with (+) PONV   Gastric bypass status for obesity    GERD (gastroesophageal reflux disease)    Gout    Headache(784.0)    HLD (hyperlipidemia)    Hypertension    IBS (irritable bowel syndrome)    IDA (iron  deficiency anemia)    a.) post 2 unit txfsn 2009, normal endo/colonoscopy by [facility]   Left bundle branch block (LBBB)    a.) Intermittently present - likely rate  related.   NSVT (nonsustained ventricular tachycardia) (HCC) 11/15/2020   a.) Holter study 11/15/2020; 11 episodes of NSVT lasting up to 8 beats with a maximum rate of 210 bpm; 14 atrial runs lasting up to 18 beats with a rate of up to 250 bpm.  Some atrial runs felt to be NSVT.   NSVT (nonsustained ventricular tachycardia) (HCC)    a. 10/2020 [facility]: 11 runs of NSVT up to 8 beats.   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 10/2020 [facility]: 14 atrial runs up to 18 beats, max HR 250.   Restless leg syndrome    Rotator cuff arthropathy, right 08/13/2017   Syncope and collapse 03/12/2014   Type II diabetes mellitus (HCC)     Assessment: Patient Reported Symptoms:  Cognitive Cognitive Status: No symptoms reported Cognitive/Intellectual Conditions Management [RPT]: None reported or documented in medical history or problem list   Health Maintenance Behaviors: Annual physical exam  Neurological Neurological Review of Symptoms: No symptoms reported Neurological Management Strategies: Medication therapy  HEENT HEENT Symptoms Reported: No symptoms reported HEENT Comment: eye - no further drainage    Cardiovascular Cardiovascular Symptoms Reported: No symptoms reported Cardiovascular Comment: BP 126/60  today with HH Nurse - not needing midodrine  Respiratory Respiratory Symptoms Reported: No symptoms reported Respiratory Management Strategies: CPAP  Endocrine Endocrine Symptoms Reported: No symptoms reported Is patient diabetic?: Yes Is patient checking blood sugars at home?: Yes List most recent blood sugar readings, include date and time of day: Freestyle Libre FBG today 112, low 54 last night due to not eating full meal after taking all of insulin  yesterday, reviewed proper treatment of lows, will get glucose tabs and packaged peanut butter crackers to  keep at bedside since husband sleeps on other side of home and will not hear her    Gastrointestinal Gastrointestinal Symptoms Reported: No symptoms reported      Genitourinary Genitourinary Symptoms Reported: No symptoms reported Additional Genitourinary Details: Purewik ordered by PCP Genitourinary Management Strategies: Incontinence garment/pad  Integumentary Additional Integumentary Details: L BKA incision open area inner aspect purulent drainage, states HH Nurse pressed on it today and expressed large amount earlier today has appointment with surgeon regarding CT of stump, reports possibilty of needing to reopen, clean out and put drain, delaying prosthesis, reports other skin intact    Musculoskeletal Other Musculoskeletal Symptoms: wheelchair bound - able to move left leg/stump right leg remains weak, getting PT/OT Nursing services, power wheelchair eval completed with PCP, hospital bed, no trapeze - patient will discuss with PT, transfers fro Bed to [facility] and back using slide board Musculoskeletal Management Strategies: Medication therapy Falls in the past year?: Yes Was there an injury with Fall?: Yes Patient at Risk for Falls Due to: History of fall(s), Impaired balance/gait, Impaired mobility, Orthopedic patient Fall risk Follow up: Falls evaluation completed, Falls prevention discussed, Education provided  Psychosocial Psychosocial Symptoms Reported: Irritability, Sadness - if selected complete PHQ 2-9, Depression - if selected complete PHQ 2-9 Additional Psychological Details: concern about needing stump opened due to infection, irritable at times sad about current medical status/adjusting to being unable to stand/move around like before, scared of falling, spoke with Palliative Care and has phone call scheduled for 08/10/24 no one has been out Behavioral Management Strategies: Support system, Medication therapy        07/23/2024    PHQ2-9 Depression Screening   Little interest or  pleasure in doing things Several days  Feeling down, depressed, or hopeless More than half the days  PHQ-2 - Total Score 3  Trouble falling or staying asleep, or sleeping too much    Feeling tired or having little energy    Poor appetite or overeating     Feeling bad about yourself - or that you are a failure or have let yourself or your family down    Trouble concentrating on things, such as reading the newspaper or watching television    Moving or speaking so slowly that other people could have noticed.  Or the opposite - being so fidgety or restless that you have been moving around a lot more than usual    Thoughts that you would be better off dead, or hurting yourself in some way    PHQ2-9 Total Score    If you checked off any problems, how difficult have these problems made it for you to do your work, take care of things at home, or get along with other people    Depression Interventions/Treatment      There were no vitals filed for this visit.    Medications Reviewed Today     Reviewed by Devra Lands, RN (Registered Nurse) on 07/23/24 at 1318  Med List  Status: <None>   Medication Order Taking? Sig Documenting Provider Last Dose Status Informant  acetaminophen  (TYLENOL ) 325 MG tablet [id number] Yes Take 2 tablets (650 mg total) by mouth every 6 (six) hours as needed for mild pain (pain score 1-3). Jens Durand, MD  Active   allopurinol  (ZYLOPRIM ) 100 MG tablet [id number] Yes Take 100 mg by mouth daily. [provider]  Active   apixaban  (ELIQUIS ) 5 MG TABS tablet [id number] Yes Take 1 tablet (5 mg total) by mouth 2 (two) times daily. Furth, Cadence H, PA-C  Active   atorvastatin  (LIPITOR ) 80 MG tablet [id number] Yes Take 1 tablet (80 mg total) by mouth daily. Furth, Cadence H, PA-C  Active   cetirizine (ZYRTEC) 10 MG tablet [id number] Yes Take 10 mg by mouth daily. [provider]  Active   Cholecalciferol  (VITAMIN D3) 1.25 MG (50000 UT) CAPS [id number] Yes Take  5,000 Units by mouth daily. [provider]  Active   clotrimazole -betamethasone  (LOTRISONE ) cream [id number] Yes Apply 1 application topically 2 (two) times daily as needed (irritation). [provider]  Active Pharmacy Records  colchicine  0.6 MG tablet [id number] Yes Take 0.6 mg by mouth daily. [provider]  Active   conjugated estrogens  (PREMARIN ) vaginal cream [id number] Yes Apply one pea-sized amount around the opening of the urethra daily for 2 Zygmunt Mcglinn, then 3 times weekly moving forward. Vaillancourt, Samantha, PA-C  Active Pharmacy Records  Continuous Glucose Sensor (FREESTYLE LIBRE 3 PLUS SENSOR) [place] [id number] Yes Change sensor every 15 days. Therisa Benton PARAS, NP  Active   dicyclomine  (BENTYL ) 20 MG tablet [id number] Yes Take 1 tablet (20 mg total) by mouth 3 (three) times daily before meals. Marylynn Verneita CROME, MD  Active Pharmacy Records  DULoxetine  (CYMBALTA ) 60 MG capsule [id number] Yes Take 60 mg by mouth daily. [provider]  Active   ezetimibe  (ZETIA ) 10 MG tablet [id number] Yes Take 1 tablet (10 mg total) by mouth daily. Furth, Cadence H, PA-C  Active   Finerenone  (KERENDIA ) 10 MG TABS [id number] Yes Take 1 tablet (10 mg total) by mouth in the morning. [place] A, FNP  Active   fluconazole  (DIFLUCAN ) 100 MG tablet [id number]  Take 1 tablet (100 mg total) by mouth daily. X 7 days  Patient not taking: Reported on 07/23/2024   Helon Kirsch A, PA-C  Active   fluticasone  (FLONASE ) 50 MCG/ACT nasal spray [id number]  Place 2 sprays into both nostrils daily.  Patient not taking: Reported on 07/23/2024   Maribeth Camellia MATSU, MD  Active Pharmacy Records  glucose blood [facility] VERIO) test strip [id number] Yes Use to check blood sugar twice daily. Marylynn Verneita CROME, MD  Active Pharmacy Records  insulin  aspart (NOVOLOG  FLEXPEN) 100 UNIT/ML FlexPen [id number] Yes Inject 8-14 Units into the skin 3 (three) times daily with meals. 90-150= 8 units; 151-200=9 units;  201-250=10 units; 251-300= 11 units; 301-350= 12 units; 351-400=13 units; 401 or higher =14 units Reardon, Benton PARAS, NP  Active   insulin  degludec (TRESIBA ) 100 UNIT/ML FlexTouch Pen [id number] Yes Inject 15 Units into the skin at bedtime. Therisa Benton PARAS, NP  Active   Insulin  Pen Needle (DROPLET PEN NEEDLES) 31G X 5 MM MISC [id number] Yes USE ONE NEEDLE SUBCUTANEOUSLY AS DIRECTED (REMOVE AND DISCARD NEEDLE IN SHARPS CONTAINER IMMEDIATELY AFTER USE) Therisa Benton PARAS, NP  Active Pharmacy Records  levothyroxine  (SYNTHROID ) 25 MCG tablet [id number] Yes Take 25 mcg by mouth daily before breakfast. [provider]  Active   lidocaine  (LIDODERM ) 5 % [id number]  Place 1 patch onto the skin daily. Remove & Discard patch within 12 hours or as directed by MD  Patient not taking: Reported on 07/23/2024   [provider]  Active   loperamide  (IMODIUM ) 2 MG capsule [id number] Yes Take 4-6 mg by mouth 3 (three) times daily before meals.  Patient taking differently: Take 4-6 mg by mouth 3 (three) times daily before meals. As needed   [provider]  Active Pharmacy Records  midodrine (PROAMATINE) 5 MG tablet [id number] Yes Take 5 mg by mouth as needed. [provider]  Active   nitroGLYCERIN  (NITROSTAT ) 0.4 MG SL tablet [id number] Yes Place 1 tablet (0.4 mg total) under the tongue every 5 (five) minutes as needed for chest pain. Furth, Cadence H, PA-C  Active   nystatin  (MYCOSTATIN /NYSTOP ) powder [id number] Yes Apply 1 g topically 4 (four) times daily as needed (rash). [provider]  Active Pharmacy Records  ondansetron  (ZOFRAN ) 4 MG tablet [id number] Yes Take 4 mg by mouth every 8 (eight) hours as needed for nausea or vomiting. [provider]  Active   OneTouch Delica Lancets 33G MISC [id number] Yes Used to check blood sugar two times a day. Marylynn Verneita CROME, MD  Active Pharmacy Records  oxycodone  (OXY-IR) 5 MG capsule [id number] Yes Take 5 mg by mouth every 4  (four) hours as needed. 2.5 mg as needed every 6 hours [provider]  Active   pantoprazole  (PROTONIX ) 40 MG tablet [id number] Yes Take 40 mg by mouth daily. [provider]  Active   phenylephrine -shark liver oil-mineral oil-petrolatum (PREPARATION H) 0.25-14-74.9 % rectal ointment [id number] Yes Place 1 Application rectally 2 (two) times daily as needed for hemorrhoids (prn every 8 hours). [provider]  Active   Polyethyl Glycol-Propyl Glycol (SYSTANE OP) [id number] Yes Place 1 drop into both eyes 4 (four) times daily as needed (dry eyes). [provider]  Active Pharmacy Records  pregabalin  (LYRICA ) 50 MG capsule [id number] Yes Take 1 capsule (50 mg total) by mouth 2 (two) times daily. Marylynn Verneita CROME, MD  Active Pharmacy Records  sitaGLIPtin  (JANUVIA ) 50 MG tablet [id number] Yes Take 50 mg by mouth daily. [provider]  Active   SITagliptin  (ZITUVIO ) 50 MG TABS [id number] Yes Take 50 mg by mouth daily. Therisa Benton PARAS, NP  Active   torsemide  (DEMADEX ) 20 MG tablet [id number]  Take 1 tablet (20 mg total) by mouth daily.  Patient not taking: Reported on 07/20/2024   Furth, Cadence H, PA-C  Active   trolamine salicylate (ASPERCREME) 10 % cream [id number]  Apply 1 Application topically daily.  Patient not taking: Reported on 07/23/2024   [provider]  Active   Med List Note Maryelizabeth Lucie ONEIDA Bishop 07/22/17 1307): CPAP            Recommendation:   PCP Follow-up Continue Current Plan of Care  Follow Up Plan:   Telephone follow-up in 1 month  Nestora Duos, MSN, RN [facility]  [facility], [facility] RN Care Manager Direct Dial: [phone] Fax: [phone]

## 2024-07-23 NOTE — Patient Instructions (Signed)
 Visit Information  Thank you for taking time to visit with me today. Please don't hesitate to contact me if I can be of assistance to you before our next scheduled appointment.  Your next care management appointment is by telephone on 08/20/2024 at 1:00 pm  Telephone follow-up in 1 month  Please call the care guide team at [phone] if you need to cancel, schedule, or reschedule an appointment.   Please call the Suicide and Crisis Lifeline: 988 call the [place]  National Suicide Prevention Lifeline: (985) 350-8827 or TTY: 270 762 4955 TTY (405) 352-1082) to talk to a trained counselor call 1-800-273-TALK (toll free, 24 hour hotline) go to [facility] [address], [place] [phone]) call 911 if you are experiencing a Mental Health or Behavioral Health Crisis or need someone to talk to.  Nestora Duos, MSN, RN [facility], [facility] RN Care Manager Direct Dial: [phone] Fax: [phone]   Preventing Hypoglycemia Hypoglycemia is when the amount of sugar, or glucose, in your blood is too low. Low blood sugar can happen if you have diabetes or if you don't have diabetes. It may be an emergency. Work with your health care provider to make and change your meal plan as needed. This can help prevent low blood sugar. What can increase my risk? You may be more likely to get low blood sugar if: You take insulin  or other diabetes medicines. You skip or delay a meal or snack. You get sick. How can low blood sugar affect me? Mild symptoms Mild cases may not cause symptoms. If you do have symptoms, they may include: Hunger or feeling like you may vomit. Sweating and feeling cold to the touch. Feeling dizzy or light-headed. Being sleepy or having trouble sleeping. A fast heart rate. A headache. Blurry eyesight. Mood changes. These include feeling worried, nervous, or easily annoyed. Tingling or numbness  around your mouth, lips, or tongue. If a mild case of low blood sugar isn't treated, it can become moderate or severe. Moderate symptoms If you have a moderate case, you may: Feel confused. Have changes in the way you act or move. Feel weak. Have an uneven heartbeat. Severe symptoms Having very low blood sugar is an emergency. It can cause: Fainting. Seizures. A coma. Death. What nutrition changes can I make? Work with your provider or an expert in healthy eating called a dietitian to make a meal plan. Eat meals at set times. Have snacks between meals, as told by your provider. Donot skip or delay meals or snacks. What other actions can I take to prevent low blood sugar?  Work closely with your provider to manage your blood sugar. Make sure you know: What your blood sugar should be. How and when to check your blood sugar. The symptoms of low blood sugar. Be sure to eat food when you drink alcohol . When you're sick, check your blood sugar more often. Make a sick day plan in advance with your provider. Follow this plan when you can't eat or drink like normal. Always check your blood sugar before, during, and after exercise. How is this treated? Treating low blood sugar If you have low blood sugar, eat or drink something with sugar in it right away. The food or drink should have 15 grams of a fast-acting carbohydrate (carb). Options include: 4 oz (120 mL) of fruit juice. 4 oz (120 mL) of soda (not diet soda). A few pieces of hard candy. Check food labels to see how many pieces  to eat. 1 Tbsp (15 mL) of sugar or honey. 4 glucose tablets. 1 tube of glucose gel. Treating low blood sugar if you have diabetes If you're alert and can swallow safely, follow the 15:15 rule: Take 15 grams of a fast-acting carb. Talk with your provider about how much carb you should take. Check your blood sugar 15 minutes after you take the carb. If your blood sugar is still at or below 70 mg/dL (3.9  mmol/L), take 15 grams of a carb again. If your blood sugar doesn't go above 70 mg/dL (3.9 mmol/L) after 3 tries, get help right away. After your blood sugar goes back to normal, eat a meal or a snack within 1 hour. Treating very low blood sugar If your blood sugar is less than 54 mg/dL (3 mmol/L), it's an emergency. Get help right away. If you can't eat or drink, you will need to be given glucagon . A family member or friend should learn how to check your blood sugar and give you glucagon . Ask your provider if you should keep a glucagon  kit at home. You may also need to be treated in a hospital. Where to find more information American Diabetes Association (ADA): diabetes.[employer] of Diabetes and Digestive and Kidney Diseases (NIDDK): stagesync.si Association of Diabetes Care & Education Specialists: diabeteseducator.org Contact a health care provider if: You have diabetes and are having trouble keeping your blood sugar in the right range. You have low blood sugar often. Get help right away if: You can't get your blood sugar above 70 mg/dL (3.9 mmol/L) after 3 tries. Your blood sugar is below 54 mg/dL (3 mmol/L). You faint. You have a seizure. These symptoms may be an emergency. Call 911 right away. Do not wait to see if the symptoms will go away. Do not drive yourself to the hospital. This information is not intended to replace advice given to you by your health care provider. Make sure you discuss any questions you have with your health care provider. Document Revised: 09/20/2022 Document Reviewed: 09/20/2022 Elsevier Patient Education  2024 [employer].

## 2024-07-24 ENCOUNTER — Ambulatory Visit: Payer: Self-pay | Admitting: Internal Medicine

## 2024-07-24 ENCOUNTER — Other Ambulatory Visit: Payer: Self-pay | Admitting: Internal Medicine

## 2024-07-24 ENCOUNTER — Other Ambulatory Visit: Payer: Self-pay

## 2024-07-24 MED ORDER — LEVOTHYROXINE SODIUM 25 MCG PO TABS: ORAL_TABLET | ORAL | 0 refills | Status: AC

## 2024-07-24 NOTE — Patient Outreach (Signed)
 Complex Care Management   Visit Note  07/24/2024  Name:  Denise Sharp MRN: [id number] DOB: May 04, 1946  Situation: Referral received for Complex Care Management related to SDOH Barriers:  Financial Resource Strain impacting ability to hire in home aide services. I obtained verbal consent from Patient.  Visit completed with Patient  on the phone  Background:   Past Medical History:  Diagnosis Date   (HFpEF) heart failure with preserved ejection fraction (HCC)    a. 12/2019 Echo: EF 65-70%, Gr2 DD. No significant valvular dzs.   Abnormality of gait 03/25/2013   Adrenal mass, left    Anginal pain    Anxiety    Aortic atherosclerosis    Arthritis    Asthma    CAD (coronary artery disease) with h/o Atypical Chest Pain    a. 04/1986 Cath ([facility]): nl cors, EF 65%; b. 07/2012 Cath [facility]): Diff minor irregs; c. 07/2016 MV [facility]): Equivocal; d. 08/2016 Cardiac CT Ca2+ score [facility]): Ca2+ 1548; e. 05/2019 MV: No ischemia. EF 75%; f. 12/2019 PCI: LM nl, LAD 65ost (3.5x12 Resolute DES), 18m (2.75x12 Resolute DES),LCX/RCA nl; g. 08/2020 Cath: patent LAD stents, RCA 40ost, elev RH pressures, EF >65%.   Cervical spinal stenosis 1994   due to trauma to back ([employer] accident), has intermittent paralysis and parasthesias   Cervicogenic headache 03/23/2014   CKD (chronic kidney disease), stage III (HCC)    Depression    Diverticulosis    Dizziness    a.) chronic   DJD (degenerative joint disease)    a. Chronic R shoulder pain   Dyspnea    Esophageal stenosis 09/2009   a.) transient outlet obstruction by food, cleared by EGD   Family history of adverse reaction to anesthesia    a.) daughter with (+) PONV   Gastric bypass status for obesity    GERD (gastroesophageal reflux disease)    Gout    Headache(784.0)    HLD (hyperlipidemia)    Hypertension    IBS (irritable bowel syndrome)    IDA (iron  deficiency anemia)    a.) post 2 unit txfsn 2009, normal endo/colonoscopy by Wohl   Left bundle branch  block (LBBB)    a.) Intermittently present - likely rate related.   NSVT (nonsustained ventricular tachycardia) (HCC) 11/15/2020   a.) Holter study 11/15/2020; 11 episodes of NSVT lasting up to 8 beats with a maximum rate of 210 bpm; 14 atrial runs lasting up to 18 beats with a rate of up to 250 bpm.  Some atrial runs felt to be NSVT.   NSVT (nonsustained ventricular tachycardia) (HCC)    a. 10/2020 [facility]: 11 runs of NSVT up to 8 beats.   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 10/2020 [facility]: 14 atrial runs up to 18 beats, max HR 250.   Restless leg syndrome    Rotator cuff arthropathy, right 08/13/2017   Syncope and collapse 03/12/2014   Type II diabetes mellitus (HCC)     Assessment: Patient shared an update on her current status. She believes she will be going in for surgery next week due to an infection. She was in the process of seeking in home aide services and her [facility].  Patient will be talking to her MD at next week's appt  about increasing the dosage  of her anxiety medication.  She has declined counseling services. Patient Reported Symptoms:   Cognitive Cognitive Status: No symptoms reported      Neurological Neurological Review of Symptoms: No symptoms reported    HEENT HEENT Symptoms Reported: No symptoms reported      Cardiovascular Cardiovascular Symptoms Reported: No symptoms reported    Respiratory Respiratory Symptoms Reported: No symptoms reported    Endocrine Endocrine Symptoms Reported: No symptoms reported    Gastrointestinal Gastrointestinal Symptoms Reported: No symptoms reported      Genitourinary Genitourinary Symptoms Reported: No symptoms reported    Integumentary Integumentary Symptoms Reported: Incision, Wound    Musculoskeletal Musculoskelatal  Symptoms Reviewed: Difficulty walking, Limited mobility, Other        Psychosocial Psychosocial Symptoms Reported: Other (Assessed overall mood- Patient reports that she does not feel she needs counseling but she intends to talk to her MD about increasing the dosage of her anxiety medication.She also reports that she has friends who always try to keep her spirits up.)          07/24/2024    PHQ2-9 Depression Screening   Little interest or pleasure in doing things    Feeling down, depressed, or hopeless    PHQ-2 - Total Score    Trouble falling or staying asleep, or sleeping too much    Feeling tired or having little energy    Poor appetite or overeating     Feeling bad about yourself - or that you are a failure or have let yourself or your family down    Trouble concentrating on things, such as reading the newspaper or watching television    Moving or speaking so slowly that other people could have noticed.  Or the opposite - being so fidgety or restless that you have been moving around a lot more than usual    Thoughts that you would be better off dead, or hurting yourself in some way    PHQ2-9 Total Score    If you checked off any problems, how difficult have these problems made it for you to do your work, take care of things at home, or get along with other people    Depression Interventions/Treatment      There were no vitals filed for this visit.    Medications Reviewed Today     Reviewed by Angelena Finis HERO, LCSW (Social Worker) on 07/24/24 at 1048  Med List Status: <None>   Medication Order Taking? Sig Documenting Provider Last Dose Status Informant  acetaminophen  (TYLENOL ) 325 MG tablet [id number] Yes Take 2 tablets (650 mg total) by mouth every 6 (six) hours as needed for mild pain (pain score 1-3). Jens Durand, MD  Active   allopurinol  (ZYLOPRIM ) 100 MG tablet [id number] Yes Take 100 mg by mouth daily. [provider]  Active   apixaban  (ELIQUIS ) 5 MG TABS  tablet [id number] Yes Take 1 tablet (5 mg total) by mouth 2 (two) times daily. Furth, Cadence H, PA-C  Active   atorvastatin  (LIPITOR ) 80 MG tablet [id number] Yes Take 1 tablet (80 mg total) by mouth daily. Furth, Cadence H, PA-C  Active   cetirizine (ZYRTEC) 10 MG tablet [id number] Yes Take 10 mg by mouth daily. [provider]  Active   Cholecalciferol  (VITAMIN D3) 1.25 MG (50000 UT) CAPS [id number] Yes Take 5,000 Units by mouth daily. [provider]  Active   clotrimazole -betamethasone  (LOTRISONE ) cream [id number] Yes Apply 1 application topically 2 (two) times daily as needed (irritation). [provider]  Active Pharmacy Records  colchicine  0.6 MG tablet [id number] Yes Take 0.6 mg by mouth daily. [provider]  Active   conjugated estrogens  (PREMARIN ) vaginal cream [id number] Yes Apply one pea-sized amount around the opening of the urethra daily for 2 weeks, then 3 times weekly moving forward. Vaillancourt, Samantha, PA-C  Active Pharmacy Records  Continuous Glucose Sensor (FREESTYLE LIBRE 3 PLUS SENSOR) [place] [id number] Yes Change sensor every 15 days. Therisa Benton PARAS, NP  Active   dicyclomine  (BENTYL ) 20 MG tablet [id number] Yes Take 1 tablet (20 mg total) by mouth 3 (three) times daily before meals. Marylynn Verneita CROME, MD  Active Pharmacy Records  DULoxetine  (CYMBALTA ) 60 MG capsule [id number] Yes Take 60 mg by mouth daily. [provider]  Active   ezetimibe  (ZETIA ) 10 MG tablet [id number] Yes Take 1 tablet (10 mg total) by mouth daily. Furth, Cadence H, PA-C  Active   Finerenone  (KERENDIA ) 10 MG TABS [id number] Yes Take 1 tablet (10 mg total) by mouth in the morning. [place] A, FNP  Active   fluconazole  (DIFLUCAN ) 100 MG tablet [id number]  Take 1 tablet (100 mg total) by mouth daily. X 7 days  Patient not taking: Reported on 07/24/2024   Helon Kirsch A, PA-C  Active   fluticasone  (FLONASE ) 50 MCG/ACT nasal spray [id number]  Place 2 sprays into both  nostrils daily.  Patient not taking: Reported on 07/24/2024   Maribeth Camellia MATSU, MD  Active Pharmacy Records  glucose blood [facility] VERIO) test strip [id number] Yes Use to check blood sugar twice daily. Marylynn Verneita CROME, MD  Active Pharmacy Records  insulin  aspart (NOVOLOG  FLEXPEN) 100 UNIT/ML FlexPen [id number] Yes Inject 8-14 Units into the skin 3 (three) times daily with meals. 90-150= 8 units; 151-200=9 units; 201-250=10 units; 251-300= 11 units; 301-350= 12 units; 351-400=13 units; 401 or higher =14 units Reardon, Benton PARAS, NP  Active   insulin  degludec (TRESIBA ) 100 UNIT/ML FlexTouch Pen [id number] Yes Inject 15 Units into the skin at bedtime. Therisa Benton PARAS, NP  Active   Insulin  Pen Needle (DROPLET PEN NEEDLES) 31G X 5 MM MISC [id number] Yes USE ONE NEEDLE SUBCUTANEOUSLY AS DIRECTED (REMOVE AND DISCARD NEEDLE IN SHARPS CONTAINER IMMEDIATELY AFTER USE) Therisa Benton PARAS, NP  Active Pharmacy Records  levothyroxine  (SYNTHROID ) 25 MCG tablet [id number] Yes Take 25 mcg by mouth daily before breakfast. [provider]  Active   lidocaine  (LIDODERM ) 5 % [id number] Yes Place 1 patch onto the skin daily. Remove & Discard patch within 12 hours or as directed by MD [provider]  Active   loperamide  (IMODIUM ) 2 MG capsule [id number] Yes Take 4-6 mg by mouth 3 (three) times daily before meals.  Patient taking differently: Take 4-6 mg by mouth 3 (three) times daily before meals. As needed   [provider]  Active Pharmacy Records  midodrine (PROAMATINE) 5 MG tablet [id number] Yes Take 5 mg by mouth as needed. [provider]  Active   nitroGLYCERIN  (NITROSTAT ) 0.4 MG SL tablet [id number] Yes Place 1 tablet (0.4 mg total) under the tongue every 5 (five) minutes as needed for chest pain. Furth, Cadence H, PA-C  Active   nystatin  (MYCOSTATIN /NYSTOP ) powder [id number] Yes Apply 1 g topically 4 (four) times daily as needed (rash). [provider]  Active Pharmacy  Records  ondansetron  (ZOFRAN ) 4 MG tablet [id number] Yes Take 4 mg by mouth every 8 (eight) hours as needed for nausea or vomiting. [provider]  Active   OneTouch Delica Lancets 33G  MISC [id number] Yes Used to check blood sugar two times a day. Marylynn Verneita CROME, MD  Active Pharmacy Records  oxycodone  (OXY-IR) 5 MG capsule [id number] Yes Take 5 mg by mouth every 4 (four) hours as needed. 2.5 mg as needed every 6 hours [provider]  Active   pantoprazole  (PROTONIX ) 40 MG tablet [id number] Yes Take 40 mg by mouth daily. [provider]  Active   phenylephrine -shark liver oil-mineral oil-petrolatum (PREPARATION H) 0.25-14-74.9 % rectal ointment [id number] Yes Place 1 Application rectally 2 (two) times daily as needed for hemorrhoids (prn every 8 hours). [provider]  Active   Polyethyl Glycol-Propyl Glycol (SYSTANE OP) [id number] Yes Place 1 drop into both eyes 4 (four) times daily as needed (dry eyes). [provider]  Active Pharmacy Records  pregabalin  (LYRICA ) 50 MG capsule [id number] Yes Take 1 capsule (50 mg total) by mouth 2 (two) times daily. Marylynn Verneita CROME, MD  Active Pharmacy Records  sitaGLIPtin  (JANUVIA ) 50 MG tablet [id number] Yes Take 50 mg by mouth daily. [provider]  Active   SITagliptin  (ZITUVIO ) 50 MG TABS [id number] Yes Take 50 mg by mouth daily. Therisa Benton PARAS, NP  Active   torsemide  (DEMADEX ) 20 MG tablet [id number]  Take 1 tablet (20 mg total) by mouth daily.  Patient not taking: Reported on 07/24/2024   Furth, Cadence H, PA-C  Active   trolamine salicylate (ASPERCREME) 10 % cream [id number]  Apply 1 Application topically daily.  Patient not taking: Reported on 07/24/2024   [provider]  Active   Med List Note Maryelizabeth Lucie ONEIDA Bishop 07/22/17 1307): CPAP            Recommendation:   PCP Follow-up Specialty Provider Follow-up Continue Current Plan of Care  Follow Up Plan:   Telephone follow up  appointment date/time:  07/31/24 at 10:00 AM.  Murray Shawl, LCSW [facility], [facility] Licensed Clinical Social Worker Direct Dial : [phone]

## 2024-07-24 NOTE — Patient Instructions (Signed)
 Visit Information  Thank you for taking time to visit with me today. Please don't hesitate to contact me if I can be of assistance to you before our next scheduled appointment.  Your next care management appointment is by telephone on 07/31/24 at 10:00 AM.   Please call the care guide team at [phone] if you need to cancel, schedule, or reschedule an appointment.   Please call [phone]-TALK (toll free, 24 hour hotline) call 911 if you are experiencing a Mental Health or Behavioral Health Crisis or need someone to talk to.  Murray Shawl, LCSW [facility], [facility] Licensed Clinical Social Worker Direct Dial : [phone]

## 2024-07-28 ENCOUNTER — Ambulatory Visit (INDEPENDENT_AMBULATORY_CARE_PROVIDER_SITE_OTHER): Admitting: Vascular Surgery

## 2024-07-28 ENCOUNTER — Encounter (INDEPENDENT_AMBULATORY_CARE_PROVIDER_SITE_OTHER): Payer: Self-pay | Admitting: Vascular Surgery

## 2024-07-28 VITALS — BP 110/64 | HR 84 | Resp 17

## 2024-07-28 DIAGNOSIS — E785 Hyperlipidemia, unspecified: Secondary | ICD-10-CM

## 2024-07-28 DIAGNOSIS — I96 Gangrene, not elsewhere classified: Secondary | ICD-10-CM

## 2024-07-28 DIAGNOSIS — E1149 Type 2 diabetes mellitus with other diabetic neurological complication: Secondary | ICD-10-CM | POA: Diagnosis not present

## 2024-07-28 DIAGNOSIS — T8740 Infection of amputation stump, unspecified extremity: Secondary | ICD-10-CM

## 2024-07-28 NOTE — Progress Notes (Unsigned)
 "   MRN : [id number]  Denise Sharp is a 79 y.o. (1946-01-30) female who presents with chief complaint of  Chief Complaint  Patient presents with   Follow-up    CT fu   .  History of Present Illness: Patient returns today in follow up of ***  Discussed the use of AI scribe software for clinical note transcription with the patient, who gave verbal consent to proceed.  History of Present Illness     Results   Current Outpatient Medications  Medication Sig Dispense Refill   acetaminophen  (TYLENOL ) 325 MG tablet Take 2 tablets (650 mg total) by mouth every 6 (six) hours as needed for mild pain (pain score 1-3).     allopurinol  (ZYLOPRIM ) 100 MG tablet Take 100 mg by mouth daily.     apixaban  (ELIQUIS ) 5 MG TABS tablet Take 1 tablet (5 mg total) by mouth 2 (two) times daily. 180 tablet 3   atorvastatin  (LIPITOR ) 80 MG tablet Take 1 tablet (80 mg total) by mouth daily. 90 tablet 3   cetirizine (ZYRTEC) 10 MG tablet Take 10 mg by mouth daily.     Cholecalciferol  (VITAMIN D3) 1.25 MG (50000 UT) CAPS Take 5,000 Units by mouth daily.     clotrimazole -betamethasone  (LOTRISONE ) cream Apply 1 application topically 2 (two) times daily as needed (irritation).     colchicine  0.6 MG tablet Take 0.6 mg by mouth daily.     conjugated estrogens  (PREMARIN ) vaginal cream Apply one pea-sized amount around the opening of the urethra daily for 2 weeks, then 3 times weekly moving forward. 30 g 4   Continuous Glucose Sensor (FREESTYLE LIBRE 3 PLUS SENSOR) MISC Change sensor every 15 days. 6 each 3   dicyclomine  (BENTYL ) 20 MG tablet Take 1 tablet (20 mg total) by mouth 3 (three) times daily before meals. 180 tablet 2   DULoxetine  (CYMBALTA ) 60 MG capsule Take 60 mg by mouth daily.     ezetimibe  (ZETIA ) 10 MG tablet Take 1 tablet (10 mg total) by mouth daily. 90 tablet 3   Finerenone  (KERENDIA ) 10 MG TABS Take 1 tablet (10 mg total) by mouth in the morning. 90 tablet 3   fluconazole  (DIFLUCAN ) 100 MG  tablet Take 1 tablet (100 mg total) by mouth daily. X 7 days (Patient not taking: Reported on 07/24/2024) 7 tablet 0   fluticasone  (FLONASE ) 50 MCG/ACT nasal spray Place 2 sprays into both nostrils daily. (Patient not taking: Reported on 07/24/2024) 16 g 6   glucose blood (ONETOUCH VERIO) test strip Use to check blood sugar twice daily. 100 each 1   insulin  aspart (NOVOLOG  FLEXPEN) 100 UNIT/ML FlexPen Inject 8-14 Units into the skin 3 (three) times daily with meals. 90-150= 8 units; 151-200=9 units; 201-250=10 units; 251-300= 11 units; 301-350= 12 units; 351-400=13 units; 401 or higher =14 units 36 mL 3   insulin  degludec (TRESIBA ) 100 UNIT/ML FlexTouch Pen Inject 15 Units into the skin at bedtime. 15 mL 1   Insulin  Pen Needle (DROPLET PEN NEEDLES) 31G X 5 MM MISC USE ONE NEEDLE SUBCUTANEOUSLY AS DIRECTED (REMOVE AND DISCARD NEEDLE IN SHARPS CONTAINER IMMEDIATELY AFTER USE) 100 each 1   levothyroxine  (SYNTHROID ) 25 MCG tablet One tablet 45 minutes before food or other medications.  Take on empty stomach 90 tablet 0   lidocaine  (LIDODERM ) 5 % Place 1 patch onto the skin daily. Remove & Discard patch within 12 hours or as directed by MD     loperamide  (IMODIUM ) 2 MG capsule Take 4-6  mg by mouth 3 (three) times daily before meals. (Patient taking differently: Take 4-6 mg by mouth 3 (three) times daily before meals. As needed)     midodrine (PROAMATINE) 5 MG tablet Take 5 mg by mouth as needed.     nitroGLYCERIN  (NITROSTAT ) 0.4 MG SL tablet Place 1 tablet (0.4 mg total) under the tongue every 5 (five) minutes as needed for chest pain. 25 tablet 3   nystatin  (MYCOSTATIN /NYSTOP ) powder Apply 1 g topically 4 (four) times daily as needed (rash).     ondansetron  (ZOFRAN ) 4 MG tablet Take 4 mg by mouth every 8 (eight) hours as needed for nausea or vomiting.     OneTouch Delica Lancets 33G MISC Used to check blood sugar two times a day. 100 each 4   oxycodone  (OXY-IR) 5 MG capsule Take 5 mg by mouth every 4 (four)  hours as needed. 2.5 mg as needed every 6 hours     pantoprazole  (PROTONIX ) 40 MG tablet Take 40 mg by mouth daily.     phenylephrine -shark liver oil-mineral oil-petrolatum (PREPARATION H) 0.25-14-74.9 % rectal ointment Place 1 Application rectally 2 (two) times daily as needed for hemorrhoids (prn every 8 hours).     Polyethyl Glycol-Propyl Glycol (SYSTANE OP) Place 1 drop into both eyes 4 (four) times daily as needed (dry eyes).     pregabalin  (LYRICA ) 50 MG capsule Take 1 capsule (50 mg total) by mouth 2 (two) times daily. 180 capsule 1   sitaGLIPtin  (JANUVIA ) 50 MG tablet Take 50 mg by mouth daily.     SITagliptin  (ZITUVIO ) 50 MG TABS Take 50 mg by mouth daily. 90 tablet 1   torsemide  (DEMADEX ) 20 MG tablet Take 1 tablet (20 mg total) by mouth daily. (Patient not taking: Reported on 07/24/2024) 90 tablet 3   trolamine salicylate (ASPERCREME) 10 % cream Apply 1 Application topically daily. (Patient not taking: Reported on 07/24/2024)     No current facility-administered medications for this visit.    Past Medical History:  Diagnosis Date   (HFpEF) heart failure with preserved ejection fraction (HCC)    a. 12/2019 Echo: EF 65-70%, Gr2 DD. No significant valvular dzs.   Abnormality of gait 03/25/2013   Adrenal mass, left    Anginal pain    Anxiety    Aortic atherosclerosis    Arthritis    Asthma    CAD (coronary artery disease) with h/o Atypical Chest Pain    a. 04/1986 Cath ([facility]): nl cors, EF 65%; b. 07/2012 Cath [facility]): Diff minor irregs; c. 07/2016 [facility]): Equivocal; d. 08/2016 Cardiac CT Ca2+ score [facility]): Ca2+ 1548; e. 05/2019 [facility]: No ischemia. EF 75%; f. 12/2019 PCI: LM nl, LAD 65ost (3.5x12 Resolute DES), 4m (2.75x12 Resolute DES),LCX/RCA nl; g. 08/2020 Cath: patent LAD stents, RCA 40ost, elev RH pressures, EF >65%.   Cervical spinal stenosis 1994   due to trauma to back ([employer] accident), has intermittent paralysis and parasthesias   Cervicogenic headache 03/23/2014   CKD (chronic  kidney disease), stage III (HCC)    Depression    Diverticulosis    Dizziness    a.) chronic   DJD (degenerative joint disease)    a. Chronic R shoulder pain   Dyspnea    Esophageal stenosis 09/2009   a.) transient outlet obstruction by food, cleared by EGD   Family history of adverse reaction to anesthesia    a.) daughter with (+) PONV   Gastric bypass status for obesity    GERD (gastroesophageal reflux disease)  Gout    Headache(784.0)    HLD (hyperlipidemia)    Hypertension    IBS (irritable bowel syndrome)    IDA (iron  deficiency anemia)    a.) post 2 unit txfsn 2009, normal endo/colonoscopy by [facility]   Left bundle branch block (LBBB)    a.) Intermittently present - likely rate related.   NSVT (nonsustained ventricular tachycardia) (HCC) 11/15/2020   a.) Holter study 11/15/2020; 11 episodes of NSVT lasting up to 8 beats with a maximum rate of 210 bpm; 14 atrial runs lasting up to 18 beats with a rate of up to 250 bpm.  Some atrial runs felt to be NSVT.   NSVT (nonsustained ventricular tachycardia) (HCC)    a. 10/2020 [facility]: 11 runs of NSVT up to 8 beats.   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 10/2020 [facility]: 14 atrial runs up to 18 beats, max HR 250.   Restless leg syndrome    Rotator cuff arthropathy, right 08/13/2017   Syncope and collapse 03/12/2014   Type II diabetes mellitus (HCC)     Past Surgical History:  Procedure Laterality Date   AMPUTATION Left 03/07/2024   Procedure: AMPUTATION BELOW KNEE;  Surgeon: Tisa Curry LABOR, MD;  Location: [facility];  Service: Vascular;  Laterality: Left;   APPENDECTOMY     BACK SURGERY     CARDIOVASCULAR STRESS TEST     CARDIOVERSION N/A 10/30/2022   Procedure: CARDIOVERSION;  Surgeon: Mady Bruckner, MD;  Location: [facility];  Service: Cardiovascular;  Laterality: N/A;    CHOLECYSTECTOMY N/A 1976   COLONOSCOPY     CORONARY ANGIOPLASTY     CORONARY STENT INTERVENTION N/A 01/04/2020   Procedure: CORONARY STENT INTERVENTION;  Surgeon: Darron Deatrice LABOR, MD;  Location: [facility];  Service: Cardiovascular;  Laterality: N/A;  LAD    DIAPHRAGMATIC HERNIA REPAIR  2015   ESOPHAGEAL DILATION     multiple   ESOPHAGOGASTRODUODENOSCOPY (EGD) WITH PROPOFOL  N/A 05/11/2021   Procedure: ESOPHAGOGASTRODUODENOSCOPY (EGD) WITH PROPOFOL ;  Surgeon: Jinny Carmine, MD;  Location: [facility];  Service: Endoscopy;  Laterality: N/A;   ESOPHAGOGASTRODUODENOSCOPY (EGD) WITH PROPOFOL  N/A 06/13/2021   Procedure: ESOPHAGOGASTRODUODENOSCOPY (EGD) WITH PROPOFOL ;  Surgeon: Jinny Carmine, MD;  Location: [facility];  Service: Endoscopy;  Laterality: N/A;   ESOPHAGOGASTRODUODENOSCOPY (EGD) WITH PROPOFOL  N/A 02/28/2023   Procedure: ESOPHAGOGASTRODUODENOSCOPY (EGD) WITH PROPOFOL ;  Surgeon: Jinny Carmine, MD;  Location: [facility];  Service: Endoscopy;  Laterality: N/A;   EYE SURGERY     GASTRIC BYPASS  2000, 2005   Dr. Mia REDBIRD IMPLANT PLACEMENT  10/2011   Ike REDBIRD IMPLANT PLACEMENT N/A 09/18/2021   Procedure: REDBIRD IMPLANT FIRST STAGE;  Surgeon: Gaston Hamilton, MD;  Location: [facility];  Service: Urology;  Laterality: N/A;   INTERSTIM IMPLANT PLACEMENT N/A 09/18/2021   Procedure: REDBIRD IMPLANT SECOND STAGE WITH IMPEDENCE CHECK;  Surgeon: Gaston Hamilton, MD;  Location: [facility];  Service: Urology;  Laterality: N/A;   INTERSTIM IMPLANT REMOVAL N/A 09/18/2021   Procedure: REMOVAL OF INTERSTIM IMPLANT;  Surgeon: Gaston Hamilton, MD;  Location: [facility];  Service: Urology;  Laterality: N/A;   LEFT HEART CATH AND CORONARY ANGIOGRAPHY N/A 04/1986   LEFT HEART CATH AND CORONARY ANGIOGRAPHY Left 07/24/2012   LEFT HEART CATH AND CORS/GRAFTS ANGIOGRAPHY N/A 12/31/2019   Procedure: LEFT HEART CATH AND CORS/GRAFTS ANGIOGRAPHY;  Surgeon: Perla Evalene PARAS, MD;  Location: [facility];  Service: Cardiovascular;  Laterality: N/A;   PANNICULECTOMY N/A 06/16/2019   Procedure: PANNICULECTOMY;  Surgeon: Elisabeth Craig RAMAN, MD;  Location: [facility];  Service: Plastics;  Laterality: N/A;  3 hours, please   REVERSE SHOULDER ARTHROPLASTY Right 08/13/2017   Procedure: REVERSE RIGHT SHOULDER ARTHROPLASTY;  Surgeon: Josefina Chew, MD;  Location: [facility];  Service: Orthopedics;  Laterality: Right;   RIGHT/LEFT HEART CATH AND CORONARY ANGIOGRAPHY N/A 09/06/2020   Procedure: RIGHT/LEFT HEART CATH AND CORONARY ANGIOGRAPHY;  Surgeon: Mady Bruckner, MD;  Location: [facility];  Service: Cardiovascular;  Laterality: N/A;   RIGHT/LEFT HEART CATH AND CORONARY ANGIOGRAPHY N/A 06/04/2022   Procedure: RIGHT/LEFT HEART CATH AND CORONARY ANGIOGRAPHY;  Surgeon: Mady Bruckner, MD;  Location: [facility];  Service: Cardiovascular;  Laterality: N/A;   ROTATOR CUFF REPAIR Right    SPINE SURGERY  1995   [place]   TOOTH EXTRACTION  11/13/2021   TOTAL ABDOMINAL HYSTERECTOMY W/ BILATERAL SALPINGOOPHORECTOMY  1974   TOTAL KNEE ARTHROPLASTY Bilateral 2007   Procedure: TOTAL KNEE ARTHROPLASTY; Surgeons: Kathi, MD and [name], MD   UMBILICAL HERNIA REPAIR  08/11/2015     Social History[1]    Family History  Problem Relation Age of Onset   Heart disease Father    Hypertension Father    Prostate cancer Father    Stroke Father    Osteoporosis Father    Stroke Mother    Depression Mother    Headache Mother    Heart disease Mother    Thyroid  disease Mother    Hypertension Mother    Diabetes Daughter    Heart disease Daughter    Hypertension Daughter    Hypertension Son      Allergies[2]   REVIEW OF SYSTEMS (Negative unless checked)  Constitutional: [] Weight loss  [] Fever  [] Chills Cardiac: [] Chest pain   [] Chest pressure   [] Palpitations   [] Shortness of breath when laying flat   [] Shortness of breath at rest   [] Shortness of breath with  exertion. Vascular:  [] Pain in legs with walking   [] Pain in legs at rest   [] Pain in legs when laying flat   [] Claudication   [] Pain in feet when walking  [] Pain in feet at rest  [] Pain in feet when laying flat   [] History of DVT   [] Phlebitis   [] Swelling in legs   [] Varicose veins   [] Non-healing ulcers Pulmonary:   [] Uses home oxygen    [] Productive cough   [] Hemoptysis   [] Wheeze  [] COPD   [] Asthma Neurologic:  [] Dizziness  [] Blackouts   [] Seizures   [] History of stroke   [] History of TIA  [] Aphasia   [] Temporary blindness   [] Dysphagia   [] Weakness or numbness in arms   [] Weakness or numbness in legs Musculoskeletal:  [] Arthritis   [] Joint swelling   [] Joint pain   [] Low back pain Hematologic:  [] Easy bruising  [] Easy bleeding   [] Hypercoagulable state   [] Anemic   Gastrointestinal:  [] Blood in stool   [] Vomiting blood  [] Gastroesophageal reflux/heartburn   [] Abdominal pain Genitourinary:  [] Chronic kidney disease   [] Difficult urination  [] Frequent urination  [] Burning with urination   [] Hematuria Skin:  [] Rashes   [] Ulcers   [] Wounds Psychological:  [] History of anxiety   []  History of major depression.  Physical Examination  There were no vitals taken for this visit. Gen:  WD/WN, NAD Head: [place]/AT, No temporalis wasting. Ear/Nose/Throat: Hearing grossly intact, nares w/o  erythema or drainage Eyes: Conjunctiva clear. Sclera non-icteric Neck: Supple.  Trachea midline Pulmonary:  Good air movement, no use of accessory muscles.  Cardiac: RRR, no JVD Vascular: *** Vessel Right Left  Radial Palpable Palpable                          PT Palpable Palpable  DP Palpable Palpable   Gastrointestinal: soft, non-tender/non-distended. No guarding/reflex.  Musculoskeletal: M/S 5/5 throughout.  No deformity or atrophy. *** edema. Neurologic: Sensation grossly intact in extremities.  Symmetrical.  Speech is fluent.  Psychiatric: Judgment intact, Mood & affect appropriate for pt's clinical  situation. Dermatologic: No rashes or ulcers noted.  No cellulitis or open wounds.  Physical Exam     Labs Recent Results (from the past 2160 hours)  Hemoglobin A1c     Status: None   Collection Time: 05/11/24 12:00 AM  Result Value Ref Range   Hemoglobin A1C 7.3   Vitamin B12     Status: None   Collection Time: 05/19/24  2:15 PM  Result Value Ref Range   Vitamin B-12 548 180 - 914 pg/mL    Comment: (NOTE) This assay is not validated for testing neonatal or myeloproliferative syndrome specimens for Vitamin B12 levels. Performed at [facility], [address], [place], [place] [address]   Iron  and TIBC([facility])     Status: Abnormal   Collection Time: 05/19/24  2:16 PM  Result Value Ref Range   Iron  44 28 - 170 ug/dL   TIBC 831 (L) 749 - 549 ug/dL   Saturation Ratios 26 10.4 - 31.8 %   UIBC 124 ug/dL    Comment: Performed at [facility], [address] [place], [place] [address]  Ferritin     Status: Abnormal   Collection Time: 05/19/24  2:16 PM  Result Value Ref Range   Ferritin 811 (H) 11 - 307 ng/mL    Comment: Performed at [facility], [address] [place], [place] [address]  CMP (Cancer Center only)     Status: Abnormal   Collection Time: 05/19/24  2:16 PM  Result Value Ref Range   Sodium 138 135 - 145 mmol/L   Potassium 3.4 (L) 3.5 - 5.1 mmol/L   Chloride 104 98 - 111 mmol/L   CO2 23 22 - 32 mmol/L   Glucose, Bld 111 (H) 70 - 99 mg/dL    Comment: Glucose reference range applies only to samples taken after fasting for at least 8 hours.   BUN 25 (H) 8 - 23 mg/dL   Creatinine 8.55 (H) 9.55 - 1.00 mg/dL   Calcium  8.1 (L) 8.9 - 10.3 mg/dL   Total Protein 6.1 (L) 6.5 - 8.1 g/dL   Albumin  2.6 (L) 3.5 - 5.0 g/dL   AST 23 15 - 41 U/L   ALT 21 0 - 44 U/L   Alkaline Phosphatase 166 (H) 38 - 126 U/L   Total Bilirubin 0.5 0.0 - 1.2 mg/dL   GFR, Estimated 37 (L) >60 mL/min    Comment: (NOTE) Calculated using the CKD-EPI  Creatinine Equation (2021)    Anion gap 11 5 - 15    Comment: Performed at [facility], [address] [place], [place] [address]  CBC with Differential (Cancer Center Only)     Status: Abnormal   Collection Time: 05/19/24  2:16 PM  Result Value Ref Range   WBC Count 12.5 (H) 4.0 - 10.5 K/uL  RBC 4.10 3.87 - 5.11 MIL/uL   Hemoglobin 12.1 12.0 - 15.0 g/dL   HCT 61.0 63.9 - 53.9 %   MCV 94.9 80.0 - 100.0 fL   MCH 29.5 26.0 - 34.0 pg   MCHC 31.1 30.0 - 36.0 g/dL   RDW 83.3 (H) 88.4 - 84.4 %   Platelet Count 304 150 - 400 K/uL   nRBC 0.0 0.0 - 0.2 %   Neutrophils Relative % 55 %   Neutro Abs 6.9 1.7 - 7.7 K/uL   Lymphocytes Relative 32 %   Lymphs Abs 4.0 0.7 - 4.0 K/uL   Monocytes Relative 7 %   Monocytes Absolute 0.8 0.1 - 1.0 K/uL   Eosinophils Relative 4 %   Eosinophils Absolute 0.4 0.0 - 0.5 K/uL   Basophils Relative 1 %   Basophils Absolute 0.1 0.0 - 0.1 K/uL   Immature Granulocytes 1 %   Abs Immature Granulocytes 0.18 (H) 0.00 - 0.07 K/uL    Comment: Performed at [facility], [address] [place], [place] [address]  Aerobic culture     Status: None   Collection Time: 07/03/24 12:00 AM  Result Value Ref Range   Aerobic Bacterial Culture Final report    Organism ID, Bacteria Comment     Comment: No growth in 36 - 48 hours.  I-STAT creatinine     Status: None   Collection Time: 07/13/24  4:36 PM  Result Value Ref Range   Creatinine, Ser 1.00 0.44 - 1.00 mg/dL  Basic metabolic panel with GFR     Status: Abnormal   Collection Time: 07/20/24  2:59 PM  Result Value Ref Range   Sodium 140 135 - 145 mEq/L   Potassium 3.9 3.5 - 5.1 mEq/L   Chloride 108 96 - 112 mEq/L   CO2 24 19 - 32 mEq/L    Comment: Elevated LDH levels may cause falsely increased CO2 results. If LDH is >2000 U/L, a positive bias of 12% is possible.   Glucose, Bld 95 70 - 99 mg/dL   BUN 13 6 - 23 mg/dL   Creatinine, Ser 8.99 0.40 - 1.20 mg/dL   GFR 46.15 (L) >39.99 mL/min    Comment:  Calculated using the CKD-EPI Creatinine Equation (2021)   Calcium  8.1 (L) 8.4 - 10.5 mg/dL  Magnesium      Status: None   Collection Time: 07/20/24  2:59 PM  Result Value Ref Range   Magnesium  1.6 1.5 - 2.5 mg/dL  TSH     Status: Abnormal   Collection Time: 07/20/24  2:59 PM  Result Value Ref Range   TSH 5.77 (H) 0.35 - 5.50 uIU/mL  Calcium , ionized     Status: None   Collection Time: 07/20/24  2:59 PM  Result Value Ref Range   Calcium , Ion 5.0 4.7 - 5.5 mg/dL    Radiology CT KNEE LEFT W CONTRAST Result Date: 07/24/2024 CT OF THE  LEFT KNEE WITH CONTRAST HISTORY:Concern for abcess in BKA. TECHNIQUE: CT axial slices of the left knee after 100 mL of iodinated Omnipaque  300 IV contrast. Coronal and sagittal reformatted images generated for review. This exam was performed utilizing automated exposure control as a radiation dose lowering technique. COMPARISON:  None available. FINDINGS: There is postoperative change related to below the knee amputation and left total knee arthroplasty. There is mild 2 moderate subcutaneous edema at the below-the-knee indication site without organized fluid collection or abscess seen. The amputation margins are well delineated without CT findings of osteomyelitis. There  is vascular calcifications within the soft tissues of the knee. No concerning enhancement is seen. IMPRESSION: 1. Mild to moderate subcutaneous edema at the below-the-knee amputation site without organized fluid collection or abscess. 2.  No CT findings of osteomyelitis. Electronically signed by: Venetia Neer MD 07/24/2024 05:06 PM EST RP Workstation: [id number]    Assessment/Plan  No problem-specific Assessment & Plan notes found for this encounter.  Assessment and Plan Assessment & Plan      Selinda Gu, MD  07/28/2024 11:21 AM    This note was created with Dragon medical transcription system.  Any errors from dictation are purely unintentional    [1]  Social History Tobacco Use    Smoking status: Never    Passive exposure: Never   Smokeless tobacco: Never  Vaping Use   Vaping status: Never Used  Substance Use Topics   Alcohol  use: No   Drug use: Not Currently    Comment: prescribed valium   [2]  Allergies Allergen Reactions   Amoxicillin  Diarrhea    amoxicillin    Clarithromycin  Nausea And Vomiting and Other (See Comments)    Severe irritable bowel  clarithromycin    Demeclocycline Hives   Erythromycin Nausea And Vomiting and Other (See Comments)    Severe irritable bowel   Flagyl [Metronidazole] Nausea And Vomiting and Other (See Comments)    Severe irritable bowel   Glucophage [Metformin Hcl] Nausea And Vomiting and Other (See Comments)    Sick I won't take anything that has metformin in it   Tetracyclines & Related Hives and Rash   Potassium Chloride  Other (See Comments)    Cannot swallow potssium pills, able to tolerate liquid potassium   Tetracycline Other (See Comments)   Diovan [Valsartan] Nausea Only        Sulfa  Antibiotics Other (See Comments) and Rash    As child   Xanax [Alprazolam] Other (See Comments)    Hyperactivity    "

## 2024-07-29 NOTE — Assessment & Plan Note (Signed)
 blood glucose control important in reducing the progression of atherosclerotic disease. Also, involved in wound healing. On appropriate medications.

## 2024-07-29 NOTE — Assessment & Plan Note (Signed)
 lipid control important in reducing the progression of atherosclerotic disease. Continue statin therapy

## 2024-07-30 ENCOUNTER — Ambulatory Visit (INDEPENDENT_AMBULATORY_CARE_PROVIDER_SITE_OTHER): Admitting: Physician Assistant

## 2024-07-30 DIAGNOSIS — N3946 Mixed incontinence: Secondary | ICD-10-CM

## 2024-07-30 NOTE — Progress Notes (Signed)
 PTNS  Session # 13  Health & Social Factors: No change.  Leg prosthesis ordered.  She is largely bedbound at home and would like a PureWick to help with urinary management. Caffeine : 4 Alcohol : 0 Daytime voids #per day: 8-10 Night-time voids #per night: 4-5 Urgency: Strong Incontinence Episodes #per day: Innumerable Ankle used: Right Treatment Setting: 19 Feeling/ Response: Sensory Comments: Patient tolerated well.  I agree with PureWick for urinary management given chronic, refractory urge incontinence.  Performed By: Ilaisaane Marts, PA-C   Follow Up: 1 month

## 2024-07-31 ENCOUNTER — Other Ambulatory Visit: Payer: Self-pay

## 2024-07-31 NOTE — Patient Instructions (Signed)
 Visit Information  Thank you for taking time to visit with me today. Please don't hesitate to contact me if I can be of assistance to you before our next scheduled appointment.  Your next care management appointment is by telephone on 08/14/2024 at 10:00 am.  Please call the care guide team at [phone] if you need to cancel, schedule, or reschedule an appointment.   Please call [phone] (toll free, 24 hour hotline) call 911 if you are experiencing a Mental Health or Behavioral Health Crisis or need someone to talk to.  Murray Shawl, LCSW [facility], [facility] Licensed Clinical Social Worker Direct Dial : [phone]

## 2024-07-31 NOTE — Patient Outreach (Signed)
 Complex Care Management   Visit Note  07/31/2024  Name:  Denise Sharp MRN: [id number] DOB: 1945/10/30  Situation: Referral received for Complex Care Management related to SDOH Barriers:  Financial Resource Strain impacting ability to hire in home aide services. I obtained verbal consent from Patient.  Visit completed with Patient  on the phone  Background:   Past Medical History:  Diagnosis Date   (HFpEF) heart failure with preserved ejection fraction (HCC)    a. 12/2019 Echo: EF 65-70%, Gr2 DD. No significant valvular dzs.   Abnormality of gait 03/25/2013   Adrenal mass, left    Anginal pain    Anxiety    Aortic atherosclerosis    Arthritis    Asthma    CAD (coronary artery disease) with h/o Atypical Chest Pain    a. 04/1986 Cath ([facility]): nl cors, EF 65%; b. 07/2012 Cath [facility]): Diff minor irregs; c. 07/2016 MV [facility]): Equivocal; d. 08/2016 Cardiac CT Ca2+ score [facility]): Ca2+ 1548; e. 05/2019 [facility]: No ischemia. EF 75%; f. 12/2019 PCI: LM nl, LAD 65ost (3.5x12 Resolute DES), 61m (2.75x12 Resolute DES),LCX/RCA nl; g. 08/2020 Cath: patent LAD stents, RCA 40ost, elev RH pressures, EF >65%.   Cervical spinal stenosis 1994   due to trauma to back ([employer] accident), has intermittent paralysis and parasthesias   Cervicogenic headache 03/23/2014   CKD (chronic kidney disease), stage III (HCC)    Depression    Diverticulosis    Dizziness    a.) chronic   DJD (degenerative joint disease)    a. Chronic R shoulder pain   Dyspnea    Esophageal stenosis 09/2009   a.) transient outlet obstruction by food, cleared by EGD   Family history of adverse reaction to anesthesia    a.) daughter with (+) PONV   Gastric bypass status for obesity    GERD (gastroesophageal reflux disease)    Gout    Headache(784.0)    HLD (hyperlipidemia)    Hypertension    IBS (irritable bowel syndrome)    IDA (iron  deficiency anemia)    a.) post 2 unit txfsn 2009, normal endo/colonoscopy by [facility]   Left bundle branch  block (LBBB)    a.) Intermittently present - likely rate related.   NSVT (nonsustained ventricular tachycardia) (HCC) 11/15/2020   a.) Holter study 11/15/2020; 11 episodes of NSVT lasting up to 8 beats with a maximum rate of 210 bpm; 14 atrial runs lasting up to 18 beats with a rate of up to 250 bpm.  Some atrial runs felt to be NSVT.   NSVT (nonsustained ventricular tachycardia) (HCC)    a. 10/2020 [facility]: 11 runs of NSVT up to 8 beats.   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 10/2020 [facility]: 14 atrial runs up to 18 beats, max HR 250.   Restless leg syndrome    Rotator cuff arthropathy, right 08/13/2017   Syncope and collapse 03/12/2014   Type II diabetes mellitus (HCC)     Assessment: Check in phone call made to Patient. She was pleased to share better than expected news about her wound and no need for further surgery. She and family have decided to seek and hire in home aide services. LCSW to follow up in 2 weeks to assess for any further need for LCSW support.  Patient Reported Symptoms:  Cognitive Cognitive Status: Able to  follow simple commands, Alert and oriented to person, place, and time, Insightful and able to interpret abstract concepts, Normal speech and language skills Cognitive/Intellectual Conditions Management [RPT]: None reported or documented in medical history or problem list   Health Maintenance Behaviors: Annual physical exam Healing Pattern: Slow Health Facilitated by: Rest  Neurological Neurological Review of Symptoms: No symptoms reported Neurological Management Strategies: Medication therapy, Adequate rest  HEENT HEENT Symptoms Reported: Other: HEENT Management Strategies: Adequate rest, Routine screening HEENT Comment: still has sinus drainage    Cardiovascular Cardiovascular Symptoms Reported: No symptoms reported     Respiratory Respiratory Symptoms Reported: No symptoms reported    Endocrine Endocrine Symptoms Reported: No symptoms reported    Gastrointestinal Gastrointestinal Symptoms Reported: No symptoms reported      Genitourinary Genitourinary Symptoms Reported: No symptoms reported    Integumentary Integumentary Symptoms Reported: Wound, Incision Skin Management Strategies: Medication therapy, Routine screening, Dressing changes, Adequate rest  Musculoskeletal Musculoskelatal Symptoms Reviewed: Difficulty walking, Limited mobility Musculoskeletal Management Strategies: Adequate rest, Routine screening, Medication therapy      Psychosocial Psychosocial Symptoms Reported: No symptoms reported Behavioral Management Strategies: Support system, Medication therapy        07/31/2024    PHQ2-9 Depression Screening   Little interest or pleasure in doing things    Feeling down, depressed, or hopeless    PHQ-2 - Total Score    Trouble falling or staying asleep, or sleeping too much    Feeling tired or having little energy    Poor appetite or overeating     Feeling bad about yourself - or that you are a failure or have let yourself or your family down    Trouble concentrating on things, such as reading the newspaper or watching television    Moving or speaking so slowly that other people could have noticed.  Or the opposite - being so fidgety or restless that you have been moving around a lot more than usual    Thoughts that you would be better off dead, or hurting yourself in some way    PHQ2-9 Total Score    If you checked off any problems, how difficult have these problems made it for you to do your work, take care of things at home, or get along with other people    Depression Interventions/Treatment      There were no vitals filed for this visit.    Medications Reviewed Today     Reviewed by Angelena Finis HERO, LCSW (Social Worker) on 07/31/24 at 1109  Med List Status: <None>    Medication Order Taking? Sig Documenting Provider Last Dose Status Informant  acetaminophen  (TYLENOL ) 325 MG tablet [id number] Yes Take 2 tablets (650 mg total) by mouth every 6 (six) hours as needed for mild pain (pain score 1-3). Jens Durand, MD  Active   allopurinol  (ZYLOPRIM ) 100 MG tablet [id number] Yes Take 100 mg by mouth daily. [provider]  Active   apixaban  (ELIQUIS ) 5 MG TABS tablet [id number] Yes Take 1 tablet (5 mg total) by mouth 2 (two) times daily. Furth, Cadence H, PA-C  Active   atorvastatin  (LIPITOR ) 80 MG tablet [id number] Yes Take 1 tablet (80 mg total) by mouth daily. Furth, Cadence H, PA-C  Active   cetirizine (ZYRTEC) 10 MG tablet [id number] Yes Take 10 mg by mouth daily. [provider]  Active   Cholecalciferol  (VITAMIN D3) 1.25 MG (50000 UT) CAPS [id number] Yes Take 5,000 Units by mouth daily. [provider]  Active  clotrimazole -betamethasone  (LOTRISONE ) cream [id number] Yes Apply 1 application topically 2 (two) times daily as needed (irritation). [provider]  Active Pharmacy Records  colchicine  0.6 MG tablet [id number] Yes Take 0.6 mg by mouth daily. [provider]  Active   conjugated estrogens  (PREMARIN ) vaginal cream [id number] Yes Apply one pea-sized amount around the opening of the urethra daily for 2 weeks, then 3 times weekly moving forward. Vaillancourt, Samantha, PA-C  Active Pharmacy Records  Continuous Glucose Sensor (FREESTYLE LIBRE 3 PLUS SENSOR) [place] [id number] Yes Change sensor every 15 days. Therisa Benton PARAS, NP  Active   dicyclomine  (BENTYL ) 20 MG tablet [id number] Yes Take 1 tablet (20 mg total) by mouth 3 (three) times daily before meals. Marylynn Verneita CROME, MD  Active Pharmacy Records  DULoxetine  (CYMBALTA ) 60 MG capsule [id number] Yes Take 60 mg by mouth daily. [provider]  Active   ezetimibe  (ZETIA ) 10 MG tablet [id number] Yes Take 1 tablet (10 mg total) by mouth daily. Furth, Cadence H, PA-C   Active   Finerenone  (KERENDIA ) 10 MG TABS [id number] Yes Take 1 tablet (10 mg total) by mouth in the morning. [place] A, FNP  Active   fluconazole  (DIFLUCAN ) 100 MG tablet [id number] Yes Take 1 tablet (100 mg total) by mouth daily. X 7 days Helon Kirsch A, PA-C  Active   fluticasone  (FLONASE ) 50 MCG/ACT nasal spray [id number] Yes Place 2 sprays into both nostrils daily. Maribeth Camellia MATSU, MD  Active Pharmacy Records  glucose blood [facility] VERIO) test strip [id number] Yes Use to check blood sugar twice daily. Marylynn Verneita CROME, MD  Active Pharmacy Records  insulin  aspart (NOVOLOG  FLEXPEN) 100 UNIT/ML FlexPen [id number] Yes Inject 8-14 Units into the skin 3 (three) times daily with meals. 90-150= 8 units; 151-200=9 units; 201-250=10 units; 251-300= 11 units; 301-350= 12 units; 351-400=13 units; 401 or higher =14 units Reardon, Benton PARAS, NP  Active   insulin  degludec (TRESIBA ) 100 UNIT/ML FlexTouch Pen [id number] Yes Inject 15 Units into the skin at bedtime. Therisa Benton PARAS, NP  Active   Insulin  Pen Needle (DROPLET PEN NEEDLES) 31G X 5 MM MISC [id number] Yes USE ONE NEEDLE SUBCUTANEOUSLY AS DIRECTED (REMOVE AND DISCARD NEEDLE IN SHARPS CONTAINER IMMEDIATELY AFTER USE) Therisa Benton PARAS, NP  Active Pharmacy Records  levothyroxine  (SYNTHROID ) 25 MCG tablet [id number] Yes One tablet 45 minutes before food or other medications.  Take on empty stomach Marylynn Verneita CROME, MD  Active   lidocaine  (LIDODERM ) 5 % [id number] Yes Place 1 patch onto the skin daily. Remove & Discard patch within 12 hours or as directed by MD [provider]  Active   loperamide  (IMODIUM ) 2 MG capsule [id number] Yes Take 4-6 mg by mouth 3 (three) times daily before meals.  Patient taking differently: Take 4-6 mg by mouth 3 (three) times daily before meals. As needed   [provider]  Active Pharmacy Records  midodrine (PROAMATINE) 5 MG tablet [id number] Yes Take 5 mg by mouth as needed. [provider]   Active   nitroGLYCERIN  (NITROSTAT ) 0.4 MG SL tablet [id number] Yes Place 1 tablet (0.4 mg total) under the tongue every 5 (five) minutes as needed for chest pain. Furth, Cadence H, PA-C  Active   nystatin  (MYCOSTATIN /NYSTOP ) powder [id number] Yes Apply 1 g topically 4 (four) times daily as needed (rash). [provider]  Active Pharmacy Records  ondansetron  (ZOFRAN ) 4 MG tablet [id number] Yes Take 4 mg by mouth every 8 (eight) hours as needed for nausea or vomiting. [provider]  Active   OneTouch Delica Lancets 33G MISC [id number] Yes Used to check blood sugar two times a day. Marylynn Verneita CROME, MD  Active Pharmacy Records  oxycodone  (OXY-IR) 5 MG capsule [id number] Yes Take 5 mg by mouth every 4 (four) hours as needed. 2.5 mg as needed every 6 hours [provider]  Active   pantoprazole  (PROTONIX ) 40 MG tablet [id number] Yes Take 40 mg by mouth daily. [provider]  Active   phenylephrine -shark liver oil-mineral oil-petrolatum (PREPARATION H) 0.25-14-74.9 % rectal ointment [id number] Yes Place 1 Application rectally 2 (two) times daily as needed for hemorrhoids (prn every 8 hours). [provider]  Active   Polyethyl Glycol-Propyl Glycol (SYSTANE OP) [id number] Yes Place 1 drop into both eyes 4 (four) times daily as needed (dry eyes). [provider]  Active Pharmacy Records  pregabalin  (LYRICA ) 50 MG capsule [id number] Yes Take 1 capsule (50 mg total) by mouth 2 (two) times daily. Marylynn Verneita CROME, MD  Active Pharmacy Records  sitaGLIPtin  (JANUVIA ) 50 MG tablet [id number] Yes Take 50 mg by mouth daily. [provider]  Active   SITagliptin  (ZITUVIO ) 50 MG TABS [id number] Yes Take 50 mg by mouth daily. Therisa Benton PARAS, NP  Active   torsemide  (DEMADEX ) 20 MG tablet [id number] Yes Take 1 tablet (20 mg total) by mouth daily. Furth, Cadence H, PA-C  Active   trolamine salicylate (ASPERCREME) 10 % cream [id number] Yes Apply 1 Application topically  daily. [provider]  Active   Med List Note Maryelizabeth Lucie ONEIDA Bishop 07/22/17 1307): CPAP            Recommendation:   PCP Follow-up Specialty provider follow-up as needed Continue Current Plan of Care  Follow Up Plan:   Telephone follow up appointment date/time:  August 14, 2024 at 10:00 am.  Murray Shawl, LCSW [facility], [facility] Licensed Clinical Social Worker Direct Dial : [phone]

## 2024-08-07 ENCOUNTER — Telehealth: Payer: Self-pay

## 2024-08-07 NOTE — Telephone Encounter (Signed)
 Verbal orders were provided to [facility] with [facility] for the patient per Dr Marylynn. Nena verbalized understanding and has no further questions at this time.

## 2024-08-07 NOTE — Telephone Encounter (Signed)
 Copied from CRM [id number]. Topic: Clinical - Home Health Verbal Orders >> Aug 07, 2024 12:04 PM Revonda D wrote: Caller/Agency: Nena with [facility]  Callback Number: [phone],dzrlmz line Service Requested: Skilled Nursing Frequency: weekly for 4 weeks, every other week for 2 weeks Any new concerns about the patient? No

## 2024-08-12 ENCOUNTER — Encounter: Payer: Self-pay | Admitting: Internal Medicine

## 2024-08-13 ENCOUNTER — Encounter: Payer: Self-pay | Admitting: Internal Medicine

## 2024-08-14 ENCOUNTER — Other Ambulatory Visit: Payer: Self-pay

## 2024-08-14 NOTE — Patient Instructions (Signed)
 Visit Information  Thank you for taking time to visit with me today. Please don't hesitate to contact me if I can be of assistance to you before our next scheduled appointment.  Your next care management appointment is by telephone on 08/21/2024 at 11:30 am.   Please call the care guide team at [phone] if you need to cancel, schedule, or reschedule an appointment.   Please call [phone]-TALK (toll free, 24 hour hotline) call 911 if you are experiencing a Mental Health or Behavioral Health Crisis or need someone to talk to.  Murray Shawl, LCSW [facility], [facility] Licensed Clinical Social Worker Direct Dial : [phone]

## 2024-08-14 NOTE — Patient Outreach (Signed)
 Complex Care Management   Visit Note  08/14/2024  Name:  Denise Sharp MRN: [id number] DOB: 1945-09-14  Situation: Referral received for Complex Care Management related to SDOH Barriers:  In home care needs. I obtained verbal consent from Patient.  Visit completed with Patient  on the phone.  Background:   Past Medical History:  Diagnosis Date   (HFpEF) heart failure with preserved ejection fraction (HCC)    a. 12/2019 Echo: EF 65-70%, Gr2 DD. No significant valvular dzs.   Abnormality of gait 03/25/2013   Adrenal mass, left    Anginal pain    Anxiety    Aortic atherosclerosis    Arthritis    Asthma    CAD (coronary artery disease) with h/o Atypical Chest Pain    a. 04/1986 Cath ([facility]): nl cors, EF 65%; b. 07/2012 Cath [facility]): Diff minor irregs; c. 07/2016 [facility]): Equivocal; d. 08/2016 Cardiac CT Ca2+ score [facility]): Ca2+ 1548; e. 05/2019 [facility]: No ischemia. EF 75%; f. 12/2019 PCI: LM nl, LAD 65ost (3.5x12 Resolute DES), 77m (2.75x12 Resolute DES),LCX/RCA nl; g. 08/2020 Cath: patent LAD stents, RCA 40ost, elev RH pressures, EF >65%.   Cervical spinal stenosis 1994   due to trauma to back ([employer] accident), has intermittent paralysis and parasthesias   Cervicogenic headache 03/23/2014   CKD (chronic kidney disease), stage III (HCC)    Depression    Diverticulosis    Dizziness    a.) chronic   DJD (degenerative joint disease)    a. Chronic R shoulder pain   Dyspnea    Esophageal stenosis 09/2009   a.) transient outlet obstruction by food, cleared by EGD   Family history of adverse reaction to anesthesia    a.) daughter with (+) PONV   Gastric bypass status for obesity    GERD (gastroesophageal reflux disease)    Gout    Headache(784.0)    HLD (hyperlipidemia)    Hypertension    IBS (irritable bowel syndrome)    IDA (iron  deficiency anemia)    a.) post 2 unit txfsn 2009, normal endo/colonoscopy by [facility]   Left bundle branch block (LBBB)    a.) Intermittently present - likely  rate related.   NSVT (nonsustained ventricular tachycardia) (HCC) 11/15/2020   a.) Holter study 11/15/2020; 11 episodes of NSVT lasting up to 8 beats with a maximum rate of 210 bpm; 14 atrial runs lasting up to 18 beats with a rate of up to 250 bpm.  Some atrial runs felt to be NSVT.   NSVT (nonsustained ventricular tachycardia) (HCC)    a. 10/2020 [facility]: 11 runs of NSVT up to 8 beats.   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 10/2020 [facility]: 14 atrial runs up to 18 beats, max HR 250.   Restless leg syndrome    Rotator cuff arthropathy, right 08/13/2017   Syncope and collapse 03/12/2014   Type II diabetes mellitus (HCC)     Assessment: Check in call to Patient. She and spouse are currently staying with their daughter due to the inclement weather. She is considering moving permanently to a SNF within the next two weeks. She is planning this move with help from various sources. LCSW will call again next week to check in.  Patient Reported Symptoms:  Cognitive Cognitive Status: Able to follow simple commands, Alert and oriented to person, place, and  time, Difficulties with attention and concentration, Insightful and able to interpret abstract concepts, Normal speech and language skills Cognitive/Intellectual Conditions Management [RPT]: None reported or documented in medical history or problem list   Health Maintenance Behaviors: Annual physical exam Healing Pattern: Slow Health Facilitated by: Rest  Neurological Neurological Review of Symptoms: No symptoms reported Neurological Management Strategies: Adequate rest, Routine screening Neurological Self-Management Outcome: 4 (good)  HEENT HEENT Symptoms Reported: No symptoms reported HEENT Management Strategies: Adequate rest, Routine screening HEENT Self-Management Outcome: 4 (good)    Cardiovascular  Cardiovascular Symptoms Reported: No symptoms reported Cardiovascular Management Strategies: Adequate rest, Routine screening Cardiovascular Self-Management Outcome: 4 (good)  Respiratory Respiratory Symptoms Reported: No symptoms reported Respiratory Management Strategies: CPAP Respiratory Self-Management Outcome: 4 (good)  Endocrine Endocrine Symptoms Reported: No symptoms reported Endocrine Self-Management Outcome: 4 (good)  Gastrointestinal Gastrointestinal Symptoms Reported: No symptoms reported Gastrointestinal Self-Management Outcome: 4 (good)    Genitourinary Genitourinary Symptoms Reported: No symptoms reported    Integumentary Integumentary Symptoms Reported: Wound Additional Integumentary Details: small pin sized hole in stump which started oozing a little today when homecare RN squeezed it Skin Management Strategies: Adequate rest, Dressing changes, Medication therapy, Routine screening Skin Self-Management Outcome: 4 (good)  Musculoskeletal Musculoskelatal Symptoms Reviewed: Limited mobility Musculoskeletal Management Strategies: Adequate rest, Medication therapy, Routine screening Musculoskeletal Self-Management Outcome: 4 (good)      Psychosocial Psychosocial Symptoms Reported: No symptoms reported          08/14/2024    PHQ2-9 Depression Screening   Little interest or pleasure in doing things    Feeling down, depressed, or hopeless    PHQ-2 - Total Score    Trouble falling or staying asleep, or sleeping too much    Feeling tired or having little energy    Poor appetite or overeating     Feeling bad about yourself - or that you are a failure or have let yourself or your family down    Trouble concentrating on things, such as reading the newspaper or watching television    Moving or speaking so slowly that other people could have noticed.  Or the opposite - being so fidgety or restless that you have been moving around a lot more than usual    Thoughts that you  would be better off dead, or hurting yourself in some way    PHQ2-9 Total Score    If you checked off any problems, how difficult have these problems made it for you to do your work, take care of things at home, or get along with other people    Depression Interventions/Treatment      There were no vitals filed for this visit. Pain Score: 0-No pain  Medications Reviewed Today     Reviewed by Angelena Finis HERO, LCSW (Social Worker) on 08/14/24 at 1049  Med List Status: <None>   Medication Order Taking? Sig Documenting Provider Last Dose Status Informant  acetaminophen  (TYLENOL ) 325 MG tablet [id number] Yes Take 2 tablets (650 mg total) by mouth every 6 (six) hours as needed for mild pain (pain score 1-3). Jens Durand, MD  Active   allopurinol  (ZYLOPRIM ) 100 MG tablet [id number] Yes Take 100 mg by mouth daily. [provider]  Active   apixaban  (ELIQUIS ) 5 MG TABS tablet [id number] Yes Take 1 tablet (5 mg total) by mouth 2 (two) times daily. Furth, Cadence H, PA-C  Active   atorvastatin  (LIPITOR ) 80 MG tablet [id number] Yes Take 1 tablet (80 mg total) by mouth daily. Franchester, Cadence  H, PA-C  Active   cetirizine (ZYRTEC) 10 MG tablet [id number] Yes Take 10 mg by mouth daily. [provider]  Active   Cholecalciferol  (VITAMIN D3) 1.25 MG (50000 UT) CAPS [id number] Yes Take 5,000 Units by mouth daily. [provider]  Active   clotrimazole -betamethasone  (LOTRISONE ) cream [id number] Yes Apply 1 application topically 2 (two) times daily as needed (irritation). [provider]  Active Pharmacy Records  colchicine  0.6 MG tablet [id number] Yes Take 0.6 mg by mouth daily. [provider]  Active   conjugated estrogens  (PREMARIN ) vaginal cream [id number] Yes Apply one pea-sized amount around the opening of the urethra daily for 2 weeks, then 3 times weekly moving forward. Vaillancourt, Samantha, PA-C  Active Pharmacy Records  Continuous Glucose Sensor (FREESTYLE  LIBRE 3 PLUS SENSOR) [place] [id number] Yes Change sensor every 15 days. Therisa Benton PARAS, NP  Active   dicyclomine  (BENTYL ) 20 MG tablet [id number] Yes Take 1 tablet (20 mg total) by mouth 3 (three) times daily before meals. Marylynn Verneita CROME, MD  Active Pharmacy Records  DULoxetine  (CYMBALTA ) 60 MG capsule [id number] Yes Take 60 mg by mouth daily. [provider]  Active   ezetimibe  (ZETIA ) 10 MG tablet [id number] Yes Take 1 tablet (10 mg total) by mouth daily. Furth, Cadence H, PA-C  Active   Finerenone  (KERENDIA ) 10 MG TABS [id number] Yes Take 1 tablet (10 mg total) by mouth in the morning. [place] A, FNP  Active   fluconazole  (DIFLUCAN ) 100 MG tablet [id number] Yes Take 1 tablet (100 mg total) by mouth daily. X 7 days Helon Kirsch A, PA-C  Active   fluticasone  (FLONASE ) 50 MCG/ACT nasal spray [id number] Yes Place 2 sprays into both nostrils daily. Maribeth Camellia MATSU, MD  Active Pharmacy Records  glucose blood [facility] VERIO) test strip [id number] Yes Use to check blood sugar twice daily. Marylynn Verneita CROME, MD  Active Pharmacy Records  insulin  aspart (NOVOLOG  FLEXPEN) 100 UNIT/ML FlexPen [id number] Yes Inject 8-14 Units into the skin 3 (three) times daily with meals. 90-150= 8 units; 151-200=9 units; 201-250=10 units; 251-300= 11 units; 301-350= 12 units; 351-400=13 units; 401 or higher =14 units Reardon, Benton PARAS, NP  Active   insulin  degludec (TRESIBA ) 100 UNIT/ML FlexTouch Pen [id number] Yes Inject 15 Units into the skin at bedtime. Therisa Benton PARAS, NP  Active   Insulin  Pen Needle (DROPLET PEN NEEDLES) 31G X 5 MM MISC [id number] Yes USE ONE NEEDLE SUBCUTANEOUSLY AS DIRECTED (REMOVE AND DISCARD NEEDLE IN SHARPS CONTAINER IMMEDIATELY AFTER USE) Therisa Benton PARAS, NP  Active Pharmacy Records  levothyroxine  (SYNTHROID ) 25 MCG tablet [id number] Yes One tablet 45 minutes before food or other medications.  Take on empty stomach Marylynn Verneita CROME, MD  Active   lidocaine  (LIDODERM ) 5 % [id number] Yes  Place 1 patch onto the skin daily. Remove & Discard patch within 12 hours or as directed by MD [provider]  Active   loperamide  (IMODIUM ) 2 MG capsule [id number] Yes Take 4-6 mg by mouth 3 (three) times daily before meals.  Patient taking differently: Take 4-6 mg by mouth 3 (three) times daily before meals. As needed   [provider]  Active Pharmacy Records  midodrine (PROAMATINE) 5 MG tablet [id number] Yes Take 5 mg by mouth as needed. [provider]  Active   nitroGLYCERIN  (NITROSTAT ) 0.4 MG SL tablet [id number] Yes Place 1 tablet (0.4 mg total) under the tongue every 5 (five) minutes as needed for chest pain. Franchester, Cadence H, PA-C  Active  nystatin  (MYCOSTATIN /NYSTOP ) powder [id number] Yes Apply 1 g topically 4 (four) times daily as needed (rash). [provider]  Active Pharmacy Records  ondansetron  (ZOFRAN ) 4 MG tablet [id number] Yes Take 4 mg by mouth every 8 (eight) hours as needed for nausea or vomiting. [provider]  Active   OneTouch Delica Lancets 33G MISC [id number] Yes Used to check blood sugar two times a day. Marylynn Verneita CROME, MD  Active Pharmacy Records  oxycodone  (OXY-IR) 5 MG capsule [id number] Yes Take 5 mg by mouth every 4 (four) hours as needed. 2.5 mg as needed every 6 hours [provider]  Active   pantoprazole  (PROTONIX ) 40 MG tablet [id number] Yes Take 40 mg by mouth daily. [provider]  Active   phenylephrine -shark liver oil-mineral oil-petrolatum (PREPARATION H) 0.25-14-74.9 % rectal ointment [id number] Yes Place 1 Application rectally 2 (two) times daily as needed for hemorrhoids (prn every 8 hours). [provider]  Active   Polyethyl Glycol-Propyl Glycol (SYSTANE OP) [id number] Yes Place 1 drop into both eyes 4 (four) times daily as needed (dry eyes). [provider]  Active Pharmacy Records  pregabalin  (LYRICA ) 50 MG capsule [id number] Yes Take 1 capsule (50 mg total) by mouth 2 (two)  times daily. Marylynn Verneita CROME, MD  Active Pharmacy Records  sitaGLIPtin  (JANUVIA ) 50 MG tablet [id number] Yes Take 50 mg by mouth daily. [provider]  Active   SITagliptin  (ZITUVIO ) 50 MG TABS [id number] Yes Take 50 mg by mouth daily. Therisa Benton PARAS, NP  Active   torsemide  (DEMADEX ) 20 MG tablet [id number] Yes Take 1 tablet (20 mg total) by mouth daily. Furth, Cadence H, PA-C  Active   trolamine salicylate (ASPERCREME) 10 % cream [id number] Yes Apply 1 Application topically daily. [provider]  Active   Med List Note Maryelizabeth Lucie ONEIDA Bishop 07/22/17 1307): CPAP            Recommendation:   PCP Follow-up Specialty provider follow-up as needed Legal Consultation Facility Visits Continue Current Plan of Care  Follow Up Plan:   Telephone follow up appointment date/time:  08/21/2024 at 11:30am.  Murray Shawl, LCSW [facility], [facility] Licensed Clinical Social Worker Direct Dial : [phone]

## 2024-08-19 ENCOUNTER — Ambulatory Visit: Admitting: Nurse Practitioner

## 2024-08-19 ENCOUNTER — Encounter: Payer: Self-pay | Admitting: Nurse Practitioner

## 2024-08-19 ENCOUNTER — Encounter: Payer: Self-pay | Admitting: Internal Medicine

## 2024-08-19 VITALS — BP 112/74 | HR 98 | Ht 64.5 in

## 2024-08-19 DIAGNOSIS — I10 Essential (primary) hypertension: Secondary | ICD-10-CM | POA: Diagnosis not present

## 2024-08-19 DIAGNOSIS — E1142 Type 2 diabetes mellitus with diabetic polyneuropathy: Secondary | ICD-10-CM

## 2024-08-19 DIAGNOSIS — E278 Other specified disorders of adrenal gland: Secondary | ICD-10-CM

## 2024-08-19 DIAGNOSIS — E782 Mixed hyperlipidemia: Secondary | ICD-10-CM | POA: Diagnosis not present

## 2024-08-19 DIAGNOSIS — E1165 Type 2 diabetes mellitus with hyperglycemia: Secondary | ICD-10-CM | POA: Diagnosis not present

## 2024-08-19 DIAGNOSIS — Z794 Long term (current) use of insulin: Secondary | ICD-10-CM | POA: Diagnosis not present

## 2024-08-19 DIAGNOSIS — E559 Vitamin D deficiency, unspecified: Secondary | ICD-10-CM

## 2024-08-19 LAB — POCT GLYCOSYLATED HEMOGLOBIN (HGB A1C): Hemoglobin A1C: 6.8 % — AB (ref 4.0–5.6)

## 2024-08-19 MED ORDER — FREESTYLE LIBRE 3 PLUS SENSOR MISC: 3 refills | Status: AC

## 2024-08-19 MED ORDER — SITAGLIPTIN 50 MG PO TABS: 50.0000 mg | ORAL_TABLET | Freq: Every day | ORAL | 3 refills | Status: AC

## 2024-08-19 MED ORDER — INSULIN DEGLUDEC 100 UNIT/ML ~~LOC~~ SOPN: 15.0000 [IU] | PEN_INJECTOR | Freq: Every day | SUBCUTANEOUS | 3 refills | Status: AC

## 2024-08-19 MED ORDER — NOVOLOG FLEXPEN 100 UNIT/ML ~~LOC~~ SOPN: 8.0000 [IU] | PEN_INJECTOR | Freq: Three times a day (TID) | SUBCUTANEOUS | 3 refills | Status: AC

## 2024-08-19 NOTE — Progress Notes (Signed)
 "                                                                        Endocrinology Follow Up Note       08/19/2024, 3:28 PM   Subjective:    Patient ID: Denise Sharp, female    DOB: January 18, 1946.  Denise Sharp is being seen in follow up after being seen in consultation for management of currently uncontrolled symptomatic diabetes requested by  Marylynn Verneita CROME, MD.   Past Medical History:  Diagnosis Date   (HFpEF) heart failure with preserved ejection fraction (HCC)    a. 12/2019 Echo: EF 65-70%, Gr2 DD. No significant valvular dzs.   Abnormality of gait 03/25/2013   Adrenal mass, left    Anginal pain    Anxiety    Aortic atherosclerosis    Arthritis    Asthma    CAD (coronary artery disease) with h/o Atypical Chest Pain    a. 04/1986 Cath ([facility]): nl cors, EF 65%; b. 07/2012 Cath [facility]): Diff minor irregs; c. 07/2016 MV [facility]): Equivocal; d. 08/2016 Cardiac CT Ca2+ score [facility]): Ca2+ 1548; e. 05/2019 MV: No ischemia. EF 75%; f. 12/2019 PCI: LM nl, LAD 65ost (3.5x12 Resolute DES), 61m (2.75x12 Resolute DES),LCX/RCA nl; g. 08/2020 Cath: patent LAD stents, RCA 40ost, elev RH pressures, EF >65%.   Cervical spinal stenosis 1994   due to trauma to back ([employer] accident), has intermittent paralysis and parasthesias   Cervicogenic headache 03/23/2014   CKD (chronic kidney disease), stage III (HCC)    Depression    Diverticulosis    Dizziness    a.) chronic   DJD (degenerative joint disease)    a. Chronic R shoulder pain   Dyspnea    Esophageal stenosis 09/2009   a.) transient outlet obstruction by food, cleared by EGD   Family history of adverse reaction to anesthesia    a.) daughter with (+) PONV   Gastric bypass status for obesity    GERD (gastroesophageal reflux disease)    Gout    Headache(784.0)    HLD (hyperlipidemia)    Hypertension    IBS (irritable bowel syndrome)    IDA (iron  deficiency anemia)    a.) post 2 unit txfsn 2009, normal endo/colonoscopy by [facility]   Left  bundle branch block (LBBB)    a.) Intermittently present - likely rate related.   NSVT (nonsustained ventricular tachycardia) (HCC) 11/15/2020   a.) Holter study 11/15/2020; 11 episodes of NSVT lasting up to 8 beats with a maximum rate of 210 bpm; 14 atrial runs lasting up to 18 beats with a rate of up to 250 bpm.  Some atrial runs felt to be NSVT.   NSVT (nonsustained ventricular tachycardia) (HCC)    a. 10/2020 [facility]: 11 runs of NSVT up to 8 beats.   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 10/2020 [facility]: 14 atrial runs up to 18 beats, max HR 250.   Restless leg syndrome    Rotator cuff arthropathy, right 08/13/2017   Syncope and collapse 03/12/2014   Type II diabetes  mellitus [facility])     Past Surgical History:  Procedure Laterality Date   AMPUTATION Left 03/07/2024   Procedure: AMPUTATION BELOW KNEE;  Surgeon: Tisa Curry LABOR, MD;  Location: [facility] ORS;  Service: Vascular;  Laterality: Left;   APPENDECTOMY     BACK SURGERY     CARDIOVASCULAR STRESS TEST     CARDIOVERSION N/A 10/30/2022   Procedure: CARDIOVERSION;  Surgeon: Mady Bruckner, MD;  Location: [facility] OR[facility];  Service: Cardiovascular;  Laterality: N/A;   CHOLECYSTECTOMY N/A 1976   COLONOSCOPY     CORONARY ANGIOPLASTY     CORONARY STENT INTERVENTION N/A 01/04/2020   Procedure: CORONARY STENT INTERVENTION;  Surgeon: Darron Deatrice LABOR, MD;  Location: [facility] INVASIVE CV LAB;  Service: Cardiovascular;  Laterality: N/A;  LAD    DIAPHRAGMATIC HERNIA REPAIR  2015   ESOPHAGEAL DILATION     multiple   ESOPHAGOGASTRODUODENOSCOPY (EGD) WITH PROPOFOL  N/A 05/11/2021   Procedure: ESOPHAGOGASTRODUODENOSCOPY (EGD) WITH PROPOFOL ;  Surgeon: Jinny Carmine, MD;  Location: [facility] ENDOS[facility];  Service: Endoscopy;  Laterality: N/A;   ESOPHAGOGASTRODUODENOSCOPY (EGD) WITH PROPOFOL  N/A 06/13/2021   Procedure:  ESOPHAGOGASTRODUODENOSCOPY (EGD) WITH PROPOFOL ;  Surgeon: Jinny Carmine, MD;  Location: [facility];  Service: Endoscopy;  Laterality: N/A;   ESOPHAGOGASTRODUODENOSCOPY (EGD) WITH PROPOFOL  N/A 02/28/2023   Procedure: ESOPHAGOGASTRODUODENOSCOPY (EGD) WITH PROPOFOL ;  Surgeon: Jinny Carmine, MD;  Location: [facility];  Service: Endoscopy;  Laterality: N/A;   EYE SURGERY     GASTRIC BYPASS  2000, 2005   Dr. Mia REDBIRD IMPLANT PLACEMENT  10/2011   Ike REDBIRD IMPLANT PLACEMENT N/A 09/18/2021   Procedure: REDBIRD IMPLANT FIRST STAGE;  Surgeon: Gaston Hamilton, MD;  Location: [facility];  Service: Urology;  Laterality: N/A;   INTERSTIM IMPLANT PLACEMENT N/A 09/18/2021   Procedure: REDBIRD IMPLANT SECOND STAGE WITH IMPEDENCE CHECK;  Surgeon: Gaston Hamilton, MD;  Location: [facility];  Service: Urology;  Laterality: N/A;   INTERSTIM IMPLANT REMOVAL N/A 09/18/2021   Procedure: REMOVAL OF INTERSTIM IMPLANT;  Surgeon: Gaston Hamilton, MD;  Location: [facility];  Service: Urology;  Laterality: N/A;   LEFT HEART CATH AND CORONARY ANGIOGRAPHY N/A 04/1986   LEFT HEART CATH AND CORONARY ANGIOGRAPHY Left 07/24/2012   LEFT HEART CATH AND CORS/GRAFTS ANGIOGRAPHY N/A 12/31/2019   Procedure: LEFT HEART CATH AND CORS/GRAFTS ANGIOGRAPHY;  Surgeon: Perla Evalene PARAS, MD;  Location: [facility];  Service: Cardiovascular;  Laterality: N/A;   PANNICULECTOMY N/A 06/16/2019   Procedure: PANNICULECTOMY;  Surgeon: Elisabeth Craig RAMAN, MD;  Location: [facility];  Service: Plastics;  Laterality: N/A;  3 hours, please   REVERSE SHOULDER ARTHROPLASTY Right 08/13/2017   Procedure: REVERSE RIGHT SHOULDER ARTHROPLASTY;  Surgeon: Josefina Chew, MD;  Location: [facility];  Service: Orthopedics;  Laterality: Right;   RIGHT/LEFT HEART CATH AND CORONARY ANGIOGRAPHY N/A 09/06/2020   Procedure: RIGHT/LEFT HEART CATH AND CORONARY ANGIOGRAPHY;  Surgeon: Mady Bruckner, MD;  Location: [facility];  Service:  Cardiovascular;  Laterality: N/A;   RIGHT/LEFT HEART CATH AND CORONARY ANGIOGRAPHY N/A 06/04/2022   Procedure: RIGHT/LEFT HEART CATH AND CORONARY ANGIOGRAPHY;  Surgeon: Mady Bruckner, MD;  Location: [facility];  Service: Cardiovascular;  Laterality: N/A;   ROTATOR CUFF REPAIR Right    SPINE SURGERY  1995   [place]   TOOTH EXTRACTION  11/13/2021   TOTAL ABDOMINAL HYSTERECTOMY W/ BILATERAL SALPINGOOPHORECTOMY  1974   TOTAL KNEE ARTHROPLASTY Bilateral 2007   Procedure: TOTAL KNEE ARTHROPLASTY; Surgeons: Kathi, MD and [name], MD   UMBILICAL HERNIA REPAIR  08/11/2015    Social History   Socioeconomic History   Marital status: Married    Spouse name: Koren    Number of children: 2   Years of education: College    Highest education level: Not on file  Occupational History    Employer: retired  Tobacco Use   Smoking status: Never    Passive exposure: Never   Smokeless tobacco: Never  Vaping Use   Vaping status: Never Used  Substance and Sexual Activity   Alcohol  use: No   Drug use: Not Currently    Comment: prescribed valium    Sexual activity: Not Currently    Birth control/protection: Post-menopausal, Surgical  Other Topics Concern   Not on file  Social History Narrative   Patient lives at home with husband Koren.    Right Handed   Drinks caffeinated tea occasionally   One pet in house, dog   Social Drivers of Health   Tobacco Use: Low Risk (08/19/2024)   Patient History    Smoking Tobacco Use: Never    Smokeless Tobacco Use: Never    Passive Exposure: Never  Financial Resource Strain: Low Risk (06/25/2024)   Overall Financial Resource Strain (CARDIA)    Difficulty of Paying Living Expenses: Not hard at all  Food Insecurity: No Food Insecurity (06/25/2024)   Epic    Worried About Programme Researcher, Broadcasting/film/video in the Last Year: Never true    Ran Out of Food in the Last Year: Never true  Transportation Needs: Unmet Transportation Needs (06/25/2024)   Epic    Lack of  Transportation (Medical): Yes    Lack of Transportation (Non-Medical): No  Physical Activity: Inactive (06/25/2024)   Exercise Vital Sign    Days of Exercise per Week: 0 days    Minutes of Exercise per Session: 0 min  Stress: Stress Concern Present (06/25/2024)   [employer] of Occupational Health - Occupational Stress Questionnaire    Feeling of Stress: Rather much  Social Connections: Moderately Isolated (06/25/2024)   Social Connection and Isolation Panel    Frequency of Communication with Friends and Family: More than three times a week    Frequency of Social Gatherings with Friends and Family: More than three times a week    Attends Religious Services: Never    Database Administrator or Organizations: No    Attends Banker Meetings: Never    Marital Status: Married  Depression (PHQ2-9): Low Risk (07/23/2024)   Depression (PHQ2-9)    PHQ-2 Score: 3  Recent Concern: Depression (PHQ2-9) - High Risk (07/20/2024)   Depression (PHQ2-9)    PHQ-2 Score: 15  Alcohol  Screen: Low Risk (06/25/2024)   Alcohol  Screen    Last Alcohol  Screening Score (AUDIT): 0  Housing: Low Risk (06/25/2024)   Epic    Unable to Pay for Housing in the Last Year: No    Number of Times Moved in the Last Year: 0    Homeless in the Last Year: No  Utilities: Not At Risk (06/25/2024)   Epic    Threatened with loss of utilities: No  Health Literacy: Adequate Health Literacy (06/25/2024)   B1300 Health Literacy    Frequency of need for help with medical instructions: Never    Family History  Problem Relation Age of Onset   Heart disease Father    Hypertension Father    Prostate cancer Father    Stroke Father    Osteoporosis Father    Stroke Mother    Depression Mother  Headache Mother    Heart disease Mother    Thyroid  disease Mother    Hypertension Mother    Diabetes Daughter    Heart disease Daughter    Hypertension Daughter    Hypertension Son     Outpatient Encounter  Medications as of 08/19/2024  Medication Sig   acetaminophen  (TYLENOL ) 325 MG tablet Take 2 tablets (650 mg total) by mouth every 6 (six) hours as needed for mild pain (pain score 1-3).   allopurinol  (ZYLOPRIM ) 100 MG tablet Take 100 mg by mouth daily.   apixaban  (ELIQUIS ) 5 MG TABS tablet Take 1 tablet (5 mg total) by mouth 2 (two) times daily.   atorvastatin  (LIPITOR ) 80 MG tablet Take 1 tablet (80 mg total) by mouth daily.   cetirizine (ZYRTEC) 10 MG tablet Take 10 mg by mouth daily.   Cholecalciferol  (VITAMIN D3) 1.25 MG (50000 UT) CAPS Take 5,000 Units by mouth daily.   clotrimazole -betamethasone  (LOTRISONE ) cream Apply 1 application topically 2 (two) times daily as needed (irritation).   colchicine  0.6 MG tablet Take 0.6 mg by mouth daily.   conjugated estrogens  (PREMARIN ) vaginal cream Apply one pea-sized amount around the opening of the urethra daily for 2 weeks, then 3 times weekly moving forward.   dicyclomine  (BENTYL ) 20 MG tablet Take 1 tablet (20 mg total) by mouth 3 (three) times daily before meals.   DULoxetine  (CYMBALTA ) 60 MG capsule Take 60 mg by mouth daily.   ezetimibe  (ZETIA ) 10 MG tablet Take 1 tablet (10 mg total) by mouth daily.   Finerenone  (KERENDIA ) 10 MG TABS Take 1 tablet (10 mg total) by mouth in the morning.   fluconazole  (DIFLUCAN ) 100 MG tablet Take 1 tablet (100 mg total) by mouth daily. X 7 days   fluticasone  (FLONASE ) 50 MCG/ACT nasal spray Place 2 sprays into both nostrils daily.   glucose blood (ONETOUCH VERIO) test strip Use to check blood sugar twice daily.   Insulin  Pen Needle (DROPLET PEN NEEDLES) 31G X 5 MM MISC USE ONE NEEDLE SUBCUTANEOUSLY AS DIRECTED (REMOVE AND DISCARD NEEDLE IN SHARPS CONTAINER IMMEDIATELY AFTER USE)   levothyroxine  (SYNTHROID ) 25 MCG tablet One tablet 45 minutes before food or other medications.  Take on empty stomach   lidocaine  (LIDODERM ) 5 % Place 1 patch onto the skin daily. Remove & Discard patch within 12 hours or as directed  by MD   loperamide  (IMODIUM ) 2 MG capsule Take 4-6 mg by mouth 3 (three) times daily before meals. (Patient taking differently: Take 4-6 mg by mouth 3 (three) times daily before meals. As needed)   midodrine (PROAMATINE) 5 MG tablet Take 5 mg by mouth as needed.   nitroGLYCERIN  (NITROSTAT ) 0.4 MG SL tablet Place 1 tablet (0.4 mg total) under the tongue every 5 (five) minutes as needed for chest pain.   nystatin  (MYCOSTATIN /NYSTOP ) powder Apply 1 g topically 4 (four) times daily as needed (rash).   ondansetron  (ZOFRAN ) 4 MG tablet Take 4 mg by mouth every 8 (eight) hours as needed for nausea or vomiting.   OneTouch Delica Lancets 33G MISC Used to check blood sugar two times a day.   oxycodone  (OXY-IR) 5 MG capsule Take 5 mg by mouth every 4 (four) hours as needed. 2.5 mg as needed every 6 hours   pantoprazole  (PROTONIX ) 40 MG tablet Take 40 mg by mouth daily.   phenylephrine -shark liver oil-mineral oil-petrolatum (PREPARATION H) 0.25-14-74.9 % rectal ointment Place 1 Application rectally 2 (two) times daily as needed for hemorrhoids (prn every 8  hours).   Polyethyl Glycol-Propyl Glycol (SYSTANE OP) Place 1 drop into both eyes 4 (four) times daily as needed (dry eyes).   pregabalin  (LYRICA ) 50 MG capsule Take 1 capsule (50 mg total) by mouth 2 (two) times daily.   torsemide  (DEMADEX ) 20 MG tablet Take 1 tablet (20 mg total) by mouth daily.   trolamine salicylate (ASPERCREME) 10 % cream Apply 1 Application topically daily.   [DISCONTINUED] Continuous Glucose Sensor (FREESTYLE LIBRE 3 PLUS SENSOR) MISC Change sensor every 15 days.   [DISCONTINUED] insulin  aspart (NOVOLOG  FLEXPEN) 100 UNIT/ML FlexPen Inject 8-14 Units into the skin 3 (three) times daily with meals. 90-150= 8 units; 151-200=9 units; 201-250=10 units; 251-300= 11 units; 301-350= 12 units; 351-400=13 units; 401 or higher =14 units   [DISCONTINUED] insulin  degludec (TRESIBA ) 100 UNIT/ML FlexTouch Pen Inject 15 Units into the skin at bedtime.    [DISCONTINUED] sitaGLIPtin  (JANUVIA ) 50 MG tablet Take 50 mg by mouth daily.   [DISCONTINUED] SITagliptin  (ZITUVIO ) 50 MG TABS Take 50 mg by mouth daily.   Continuous Glucose Sensor (FREESTYLE LIBRE 3 PLUS SENSOR) MISC Change sensor every 15 days.   insulin  aspart (NOVOLOG  FLEXPEN) 100 UNIT/ML FlexPen Inject 8-14 Units into the skin 3 (three) times daily with meals. 90-150= 8 units; 151-200=9 units; 201-250=10 units; 251-300= 11 units; 301-350= 12 units; 351-400=13 units; 401 or higher =14 units   insulin  degludec (TRESIBA ) 100 UNIT/ML FlexTouch Pen Inject 15 Units into the skin at bedtime.   SITagliptin  (ZITUVIO ) 50 MG TABS Take 50 mg by mouth daily.   No facility-administered encounter medications on file as of 08/19/2024.    ALLERGIES: Allergies  Allergen Reactions   Amoxicillin  Diarrhea    amoxicillin    Clarithromycin  Nausea And Vomiting and Other (See Comments)    Severe irritable bowel  clarithromycin    Demeclocycline Hives   Erythromycin Nausea And Vomiting and Other (See Comments)    Severe irritable bowel   Flagyl [Metronidazole] Nausea And Vomiting and Other (See Comments)    Severe irritable bowel   Glucophage [Metformin Hcl] Nausea And Vomiting and Other (See Comments)    Sick I won't take anything that has metformin in it   Tetracyclines & Related Hives and Rash   Potassium Chloride  Other (See Comments)    Cannot swallow potssium pills, able to tolerate liquid potassium   Tetracycline Other (See Comments)   Diovan [Valsartan] Nausea Only        Sulfa  Antibiotics Other (See Comments) and Rash    As child   Xanax [Alprazolam] Other (See Comments)    Hyperactivity     VACCINATION STATUS: Immunization History  Administered Date(s) Administered   Fluad Quad(high Dose 65+) 03/06/2019, 06/07/2020, 07/27/2021, 07/13/2022   Fluad Trivalent(High Dose 65+) 03/29/2023   INFLUENZA, HIGH DOSE SEASONAL PF 08/21/2018   Influenza Split 06/13/2011   Influenza,inj,Quad  PF,6+ Mos 05/04/2014, 05/12/2015   Influenza-Unspecified 04/15/2012   Moderna Sars-Covid-2 Vaccination 02/02/2020, 03/01/2020, 09/01/2020   Pneumococcal Conjugate-13 08/11/2013, 01/06/2014   Pneumococcal Polysaccharide-23 01/19/2010, 09/03/2018   Tdap 04/18/2016, 11/20/2020    Diabetes She presents for her follow-up diabetic visit. She has type 2 diabetes mellitus. Onset time: Diagnosed at approx age of 44. Her disease course has been improving. There are no hypoglycemic associated symptoms. (syncope) Associated symptoms include fatigue, foot paresthesias, polydipsia and polyuria (on diuretics). Pertinent negatives for diabetes include no weight loss. There are no hypoglycemic complications. (Been known to have syncopal episodes in the past) Symptoms are stable. Diabetic complications include a CVA (incidental finding), heart  disease, nephropathy and peripheral neuropathy. Risk factors for coronary artery disease include obesity, hypertension, diabetes mellitus, dyslipidemia, family history, sedentary lifestyle and post-menopausal. Current diabetic treatment includes intensive insulin  program and oral agent (monotherapy). She is compliant with treatment most of the time. Her weight is increasing rapidly. She is following a generally unhealthy (she admits to eating on the go a lot due to various appointments) diet. When asked about meal planning, she reported none. She has not had a previous visit with a dietitian. She rarely participates in exercise. Her home blood glucose trend is decreasing steadily. Her overall blood glucose range is 140-180 mg/dl. (She presents today, accompanied by her husband, with her CGM showing mostly at goal glycemic profile.  Her POCT A1c today is 6.8%, improving from last visit of 7.3%. Analysis of her CGM shows TIR 76%, TAR 24%, TBR 0% with a GMI of 6.9%.  She notes her family has made decision to place her in a nursing home as she can no longer take care of herself, nor can  her husband provide the level of care she needs.  She has had ongoing stump infections, limiting her from using prosthesis.) An ACE inhibitor/angiotensin II receptor blocker is not being taken. She sees a podiatrist.Eye exam is current.     Review of systems  Constitutional: + stable body weight, current Body mass index is 37.01 kg/m., + fatigue, no subjective hyperthermia, no subjective hypothermia, Eyes: no blurry vision, no xerophthalmia ENT: no sore throat, no nodules palpated in throat, no dysphagia/odynophagia, no hoarseness Cardiovascular: no chest pain, no shortness of breath, no palpitations Respiratory: no cough, + shortness of breath Gastrointestinal: no nausea/vomiting, on Creon  for EPI, fecal incontinence Genitourinary: urinary incontinence Musculoskeletal: no muscle/joint aches, WC bound, L-BKA - still has spot draining- recurrent stump infections Skin: no rashes, no hyperemia Neurological: no tremors, no numbness, no tingling, no dizziness Psychiatric: no depression, no anxiety  Objective:     BP 112/74 (BP Location: Right Arm, Patient Position: Sitting, Cuff Size: Large)   Pulse 98   Ht 5' 4.5 (1.638 m)   BMI 37.01 kg/m   Wt Readings from Last 3 Encounters:  07/20/24 219 lb (99.3 kg)  07/13/24 219 lb (99.3 kg)  06/29/24 212 lb (96.2 kg)     BP Readings from Last 3 Encounters:  08/19/24 112/74  07/28/24 110/64  07/20/24 108/64     Physical Exam- Limited  Constitutional:  Body mass index is 37.01 kg/m. , not in acute distress, normal state of mind Eyes:  EOMI, no exophthalmos Musculoskeletal: WC bound, left BKA Skin:  no rashes, no hyperemia Neurological: no tremor with outstretched hands  Diabetic Foot Exam - Simple   No data filed     CMP ( most recent) CMP     Component Value Date/Time   NA 140 07/20/2024 1459   NA 137 10/21/2023 1417   NA 141 06/21/2014 1349   K 3.9 07/20/2024 1459   K 4.0 06/21/2014 1349   CL 108 07/20/2024 1459   CL  106 06/21/2014 1349   CO2 24 07/20/2024 1459   CO2 24 06/21/2014 1349   GLUCOSE 95 07/20/2024 1459   GLUCOSE 174 (H) 06/21/2014 1349   BUN 13 07/20/2024 1459   BUN 30 (H) 10/21/2023 1417   BUN 12 06/21/2014 1349   CREATININE 1.00 07/20/2024 1459   CREATININE 1.44 (H) 05/19/2024 1416   CREATININE 1.30 (H) 01/24/2024 1009   CALCIUM  8.1 (L) 07/20/2024 1459   CALCIUM  8.7 06/21/2014 1349  PROT 6.1 (L) 05/19/2024 1416   PROT 7.3 10/30/2012 1436   ALBUMIN  2.6 (L) 05/19/2024 1416   ALBUMIN  3.0 (L) 10/30/2012 1436   AST 23 05/19/2024 1416   ALT 21 05/19/2024 1416   ALT 19 10/30/2012 1436   ALKPHOS 166 (H) 05/19/2024 1416   ALKPHOS 125 10/30/2012 1436   BILITOT 0.5 05/19/2024 1416   GFRNONAA 37 (L) 05/19/2024 1416   GFRNONAA 57 (L) 06/21/2014 1349   GFRNONAA 55 (L) 02/25/2014 2153   GFRNONAA 70 12/13/2011 0919   GFRAA >60 03/23/2020 1204   GFRAA >60 06/21/2014 1349   GFRAA >60 02/25/2014 2153   GFRAA 80 12/13/2011 0919     Diabetic Labs (most recent): Lab Results  Component Value Date   HGBA1C 6.8 (A) 08/19/2024   HGBA1C 7.3 05/11/2024   HGBA1C 7.6 (A) 01/07/2024   MICROALBUR 0.4 09/30/2023     Lipid Panel ( most recent) Lipid Panel     Component Value Date/Time   CHOL 90 10/09/2023 1204   CHOL 155 02/27/2014 0420   TRIG 100 10/09/2023 1204   TRIG 121 02/27/2014 0420   HDL 43 (L) 10/09/2023 1204   HDL 43 02/27/2014 0420   CHOLHDL 2.1 10/09/2023 1204   VLDL 20 10/23/2022 1324   VLDL 24 02/27/2014 0420   LDLCALC 28 10/09/2023 1204   LDLCALC 88 02/27/2014 0420   LDLDIRECT 126 (H) 07/13/2022 1529      Lab Results  Component Value Date   TSH 5.77 (H) 07/20/2024   TSH 2.887 09/27/2022   TSH 2.209 02/08/2021   TSH 3.695 07/17/2020   TSH 3.50 01/01/2019   TSH 2.279 08/20/2017   TSH 4.400 06/16/2015   TSH 1.16 05/03/2015   TSH 0.408 (L) 10/29/2014   TSH 5.099 (H) 08/13/2014   FREET4 1.10 05/03/2015           Assessment & Plan:   1) Type 2 diabetes  mellitus with hyperglycemia, with long-term current use of insulin  (HCC)  She presents today, accompanied by her husband, with her CGM showing mostly at goal glycemic profile.  Her POCT A1c today is 6.8%, improving from last visit of 7.3%. Analysis of her CGM shows TIR 76%, TAR 24%, TBR 0% with a GMI of 6.9%.  She notes her family has made decision to place her in a nursing home as she can no longer take care of herself, nor can her husband provide the level of care she needs.  She has had ongoing stump infections, limiting her from using prosthesis.  - Denise Sharp has currently uncontrolled symptomatic type 2 DM since 79 years of age.   -Recent labs reviewed.  - I had a long discussion with her about the progressive nature of diabetes and the pathology behind its complications. -her diabetes is complicated by CAD, CHF, Neuropathy, CKD stage 3, CVA and she remains at a high risk for more acute and chronic complications which include CAD, CVA, CKD, retinopathy, and neuropathy. These are all discussed in detail with her.  The following Lifestyle Medicine recommendations according to American College of Lifestyle Medicine [facility]) were discussed and offered to patient and she agrees to start the journey:  A. Whole Foods, Plant-based plate comprising of fruits and vegetables, plant-based proteins, whole-grain carbohydrates was discussed in detail with the patient.   A list for source of those nutrients were also provided to the patient.  Patient will use only water  or unsweetened tea for hydration. B.  The need to stay away from  risky substances including alcohol , smoking; obtaining 7 to 9 hours of restorative sleep, at least 150 minutes of moderate intensity exercise weekly, the importance of healthy social connections,  and stress reduction techniques were discussed. C.  A full color page of  Calorie density of various food groups per pound showing examples of each food groups was provided to the  patient.  - Nutritional counseling repeated/built upon at each appointment.  - The patient admits there is a room for improvement in their diet and drink choices. -  Suggestion is made for the patient to avoid simple carbohydrates from their diet including Cakes, Sweet Desserts / Pastries, Ice Cream, Soda (diet and regular), Sweet Tea, Candies, Chips, Cookies, Sweet Pastries, Store Bought Juices, Alcohol  in Excess of 1-2 drinks a day, Artificial Sweeteners, Coffee Creamer, and Sugar-free Products. This will help patient to have stable blood glucose profile and potentially avoid unintended weight gain.   - I encouraged the patient to switch to unprocessed or minimally processed complex starch and increased protein intake (animal or plant source), fruits, and vegetables.   - Patient is advised to stick to a routine mealtimes to eat 3 meals a day and avoid unnecessary snacks (to snack only to correct hypoglycemia).  - I have approached her with the following individualized plan to manage her diabetes and patient agrees:   -She is advised to continue Tresiba  15 units SQ nightly, Novolog  8-14 units TID with meals if glucose is above 70 and she is eating (Specific instructions on how to titrate insulin  dosage based on glucose readings given to patient in writing), and Sitagliptin  50 mg po daily (cannot increase due to renal function).  -she is encouraged to continue monitoring glucose 4 times daily (using her CGM), before meals and before bed, and to call the clinic if she has readings less than 70 or above 300 for 3 tests in a row.  - she is warned not to take insulin  without proper monitoring per orders. - Adjustment parameters are given to her for hypo and hyperglycemia in writing.  - she is not a candidate for Metformin due to concurrent renal insufficiency, nor did she tolerate it in the past.  She also did not tolerate GLP1 products due to nausea previously.  - Specific targets for  A1c; LDL,  HDL, and Triglycerides were discussed with the patient.  2) Blood Pressure /Hypertension:  her blood pressure is controlled to target-slightly low today in office.   she is advised to continue her current medications per cardiology/PCP.  3) Lipids/Hyperlipidemia:    Review of her recent lipid panel from 10/23/22 showed controlled LDL at 45 .  she is advised to continue Lipitor  80 mg daily at bedtime and Zetia  10 mg po daily.  Side effects and precautions discussed with her.  4)  Weight/Diet:  her Body mass index is 37.01 kg/m.  -  clearly complicating her diabetes care.   she is a candidate for weight loss. I discussed with her the fact that loss of 5 - 10% of her  current body weight will have the most impact on her diabetes management.  Exercise, and detailed carbohydrates information provided  -  detailed on discharge instructions.  She has had gastric bypass surgery x 2.  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  Recent imaging of abdomen (done for different reason) shows stable mass, still consistent with benignity.  She will not need additional follow up for this.  6)  Chronic Care/Health Maintenance: -she is not on ACEI/ARB and is on Statin medications and is encouraged to initiate and continue to follow up with Ophthalmology, Dentist, Podiatrist at least yearly or according to recommendations, and advised to stay away from smoking. I have recommended yearly flu vaccine and pneumonia vaccine at least every 5 years; moderate intensity exercise for up to 150 minutes weekly; and sleep for at least 7 hours a day.  - she is advised to maintain close follow up with Tullo, Teresa L, MD for primary care needs, as well as her other providers for optimal and coordinated care.      I spent  52  minutes in the care of the patient today including review of labs from CMP, Lipids, Thyroid  Function, Hematology (current and previous including abstractions from other  facilities); face-to-face time discussing  her blood glucose readings/logs, discussing hypoglycemia and hyperglycemia episodes and symptoms, medications doses, her options of short and long term treatment based on the latest standards of care / guidelines;  discussion about incorporating lifestyle medicine;  and documenting the encounter. Risk reduction counseling performed per USPSTF guidelines to reduce obesity and cardiovascular risk factors.     Please refer to Patient Instructions for Blood Glucose Monitoring and Insulin /Medications Dosing Guide  in media tab for additional information. Please  also refer to  Patient Self Inventory in the Media  tab for reviewed elements of pertinent patient history.  Denise Sharp participated in the discussions, expressed understanding, and voiced agreement with the above plans.  All questions were answered to her satisfaction. she is encouraged to contact clinic should she have any questions or concerns prior to her return visit.   Follow up plan: - Return in about 4 months (around 12/17/2024) for Diabetes F/U with A1c in office, No previsit labs, Bring meter and logs.  Benton Rio, [facility] [address] [place], [place] [address] Phone: [phone] Fax: [phone]  08/19/2024, 3:28 PM    "

## 2024-08-20 ENCOUNTER — Encounter: Payer: Self-pay | Admitting: Internal Medicine

## 2024-08-20 ENCOUNTER — Telehealth

## 2024-08-21 ENCOUNTER — Other Ambulatory Visit: Payer: Self-pay

## 2024-08-21 NOTE — Patient Outreach (Signed)
 Phone call to patient who was unable to talk. She requested LCSW reschedule the call for a time after 2/11. LCSW agreed to do so.  Murray Shawl, LCSW [facility], [facility] Licensed Clinical Social Worker Direct Dial : [phone]

## 2024-08-25 ENCOUNTER — Ambulatory Visit: Admitting: Family

## 2024-08-27 ENCOUNTER — Ambulatory Visit: Admitting: Physician Assistant

## 2024-09-03 ENCOUNTER — Ambulatory Visit: Admitting: Physician Assistant

## 2024-09-10 ENCOUNTER — Telehealth

## 2024-09-16 ENCOUNTER — Inpatient Hospital Stay

## 2024-09-16 ENCOUNTER — Inpatient Hospital Stay: Admitting: Internal Medicine

## 2024-09-18 ENCOUNTER — Inpatient Hospital Stay

## 2024-09-18 ENCOUNTER — Inpatient Hospital Stay: Admitting: Internal Medicine

## 2024-09-25 ENCOUNTER — Ambulatory Visit (INDEPENDENT_AMBULATORY_CARE_PROVIDER_SITE_OTHER): Admitting: Vascular Surgery

## 2024-10-21 ENCOUNTER — Ambulatory Visit: Admitting: Internal Medicine

## 2024-12-01 ENCOUNTER — Ambulatory Visit: Admitting: Neurology

## 2024-12-17 ENCOUNTER — Ambulatory Visit: Admitting: Nurse Practitioner

## 2024-12-28 ENCOUNTER — Ambulatory Visit: Admitting: Urology

## 2025-01-11 ENCOUNTER — Ambulatory Visit: Admitting: Medical
